# Patient Record
Sex: Female | Born: 1955
Health system: Southern US, Community
[De-identification: ages and names within clinical notes are randomized; demographics above are authoritative.]

## PROBLEM LIST (undated history)

## (undated) DIAGNOSIS — J449 Chronic obstructive pulmonary disease, unspecified: Secondary | ICD-10-CM

## (undated) DIAGNOSIS — F329 Major depressive disorder, single episode, unspecified: Secondary | ICD-10-CM

## (undated) DIAGNOSIS — M1990 Unspecified osteoarthritis, unspecified site: Secondary | ICD-10-CM

## (undated) DIAGNOSIS — N189 Chronic kidney disease, unspecified: Secondary | ICD-10-CM

## (undated) DIAGNOSIS — D649 Anemia, unspecified: Secondary | ICD-10-CM

## (undated) DIAGNOSIS — I499 Cardiac arrhythmia, unspecified: Secondary | ICD-10-CM

## (undated) DIAGNOSIS — E785 Hyperlipidemia, unspecified: Secondary | ICD-10-CM

## (undated) DIAGNOSIS — G4730 Sleep apnea, unspecified: Secondary | ICD-10-CM

## (undated) DIAGNOSIS — R079 Chest pain, unspecified: Secondary | ICD-10-CM

## (undated) DIAGNOSIS — F32A Depression, unspecified: Secondary | ICD-10-CM

## (undated) DIAGNOSIS — O2230 Deep phlebothrombosis in pregnancy, unspecified trimester: Secondary | ICD-10-CM

## (undated) DIAGNOSIS — J45909 Unspecified asthma, uncomplicated: Secondary | ICD-10-CM

## (undated) DIAGNOSIS — R51 Headache: Secondary | ICD-10-CM

## (undated) DIAGNOSIS — E119 Type 2 diabetes mellitus without complications: Secondary | ICD-10-CM

## (undated) DIAGNOSIS — E669 Obesity, unspecified: Secondary | ICD-10-CM

## (undated) DIAGNOSIS — I5032 Chronic diastolic (congestive) heart failure: Secondary | ICD-10-CM

## (undated) DIAGNOSIS — I10 Essential (primary) hypertension: Secondary | ICD-10-CM

## (undated) DIAGNOSIS — K219 Gastro-esophageal reflux disease without esophagitis: Secondary | ICD-10-CM

## (undated) DIAGNOSIS — R0602 Shortness of breath: Secondary | ICD-10-CM

## (undated) DIAGNOSIS — J189 Pneumonia, unspecified organism: Secondary | ICD-10-CM

## (undated) HISTORY — DX: Sleep apnea, unspecified: G47.30

## (undated) HISTORY — DX: Type 2 diabetes mellitus without complications: E11.9

## (undated) HISTORY — PX: CHOLECYSTECTOMY: SHX55

## (undated) HISTORY — DX: Obesity, unspecified: E66.9

## (undated) HISTORY — PX: CATARACT EXTRACTION, BILATERAL: SHX1313

## (undated) HISTORY — DX: Hyperlipidemia, unspecified: E78.5

## (undated) HISTORY — PX: OTHER SURGICAL HISTORY: SHX169

## (undated) HISTORY — DX: Essential (primary) hypertension: I10

## (undated) HISTORY — DX: Deep phlebothrombosis in pregnancy, unspecified trimester: O22.30

## (undated) HISTORY — PX: KNEE SURGERY: SHX244

## (undated) HISTORY — PX: ABDOMINAL HYSTERECTOMY: SHX81

## (undated) HISTORY — DX: Chronic diastolic (congestive) heart failure: I50.32

## (undated) HISTORY — PX: DIAGNOSTIC LAPAROSCOPY: SUR761

---

## 1998-01-04 ENCOUNTER — Emergency Department (HOSPITAL_COMMUNITY): Admission: EM | Admit: 1998-01-04 | Discharge: 1998-01-04 | Payer: Self-pay | Admitting: Emergency Medicine

## 1998-01-04 ENCOUNTER — Inpatient Hospital Stay (HOSPITAL_COMMUNITY): Admission: EM | Admit: 1998-01-04 | Discharge: 1998-01-04 | Payer: Self-pay | Admitting: Cardiology

## 1998-06-02 ENCOUNTER — Ambulatory Visit (HOSPITAL_COMMUNITY): Admission: RE | Admit: 1998-06-02 | Discharge: 1998-06-02 | Payer: Self-pay | Admitting: *Deleted

## 1998-11-18 ENCOUNTER — Other Ambulatory Visit: Admission: RE | Admit: 1998-11-18 | Discharge: 1998-11-18 | Payer: Self-pay | Admitting: Obstetrics and Gynecology

## 1998-11-22 ENCOUNTER — Ambulatory Visit (HOSPITAL_COMMUNITY): Admission: RE | Admit: 1998-11-22 | Discharge: 1998-11-22 | Payer: Self-pay | Admitting: Obstetrics and Gynecology

## 1998-11-22 ENCOUNTER — Encounter: Payer: Self-pay | Admitting: Obstetrics and Gynecology

## 1998-12-08 ENCOUNTER — Other Ambulatory Visit: Admission: RE | Admit: 1998-12-08 | Discharge: 1998-12-08 | Payer: Self-pay | Admitting: Obstetrics and Gynecology

## 1998-12-15 ENCOUNTER — Encounter: Payer: Self-pay | Admitting: Cardiology

## 1998-12-15 ENCOUNTER — Emergency Department (HOSPITAL_COMMUNITY): Admission: EM | Admit: 1998-12-15 | Discharge: 1998-12-15 | Payer: Self-pay | Admitting: Emergency Medicine

## 1999-04-08 ENCOUNTER — Encounter (INDEPENDENT_AMBULATORY_CARE_PROVIDER_SITE_OTHER): Payer: Self-pay | Admitting: Specialist

## 1999-04-09 ENCOUNTER — Inpatient Hospital Stay (HOSPITAL_COMMUNITY): Admission: RE | Admit: 1999-04-09 | Discharge: 1999-04-10 | Payer: Self-pay | Admitting: Obstetrics and Gynecology

## 1999-09-14 ENCOUNTER — Emergency Department (HOSPITAL_COMMUNITY): Admission: EM | Admit: 1999-09-14 | Discharge: 1999-09-14 | Payer: Self-pay | Admitting: Emergency Medicine

## 1999-09-14 ENCOUNTER — Encounter: Payer: Self-pay | Admitting: Emergency Medicine

## 1999-09-19 ENCOUNTER — Ambulatory Visit (HOSPITAL_COMMUNITY): Admission: RE | Admit: 1999-09-19 | Discharge: 1999-09-19 | Payer: Self-pay | Admitting: Cardiology

## 1999-09-20 ENCOUNTER — Encounter: Payer: Self-pay | Admitting: Cardiology

## 1999-09-29 ENCOUNTER — Ambulatory Visit (HOSPITAL_COMMUNITY): Admission: RE | Admit: 1999-09-29 | Discharge: 1999-09-29 | Payer: Self-pay | Admitting: Cardiology

## 1999-10-13 ENCOUNTER — Ambulatory Visit (HOSPITAL_COMMUNITY): Admission: RE | Admit: 1999-10-13 | Discharge: 1999-10-31 | Payer: Self-pay | Admitting: Cardiology

## 2000-10-18 ENCOUNTER — Encounter: Payer: Self-pay | Admitting: Emergency Medicine

## 2000-10-18 ENCOUNTER — Emergency Department (HOSPITAL_COMMUNITY): Admission: EM | Admit: 2000-10-18 | Discharge: 2000-10-18 | Payer: Self-pay | Admitting: Emergency Medicine

## 2000-11-14 ENCOUNTER — Encounter: Admission: RE | Admit: 2000-11-14 | Discharge: 2000-12-06 | Payer: Self-pay | Admitting: Orthopedic Surgery

## 2000-12-27 ENCOUNTER — Encounter: Admission: RE | Admit: 2000-12-27 | Discharge: 2001-03-27 | Payer: Self-pay | Admitting: Orthopedic Surgery

## 2001-01-16 ENCOUNTER — Encounter: Admission: RE | Admit: 2001-01-16 | Discharge: 2001-04-16 | Payer: Self-pay | Admitting: Anesthesiology

## 2001-02-19 ENCOUNTER — Encounter: Payer: Self-pay | Admitting: Cardiology

## 2001-02-19 ENCOUNTER — Ambulatory Visit (HOSPITAL_COMMUNITY): Admission: RE | Admit: 2001-02-19 | Discharge: 2001-02-19 | Payer: Self-pay | Admitting: Cardiology

## 2001-04-16 ENCOUNTER — Emergency Department (HOSPITAL_COMMUNITY): Admission: EM | Admit: 2001-04-16 | Discharge: 2001-04-16 | Payer: Self-pay | Admitting: Emergency Medicine

## 2001-04-30 ENCOUNTER — Encounter: Admission: RE | Admit: 2001-04-30 | Discharge: 2001-07-12 | Payer: Self-pay | Admitting: Orthopedic Surgery

## 2001-08-27 ENCOUNTER — Emergency Department (HOSPITAL_COMMUNITY): Admission: EM | Admit: 2001-08-27 | Discharge: 2001-08-27 | Payer: Self-pay | Admitting: Emergency Medicine

## 2002-07-28 ENCOUNTER — Other Ambulatory Visit: Admission: RE | Admit: 2002-07-28 | Discharge: 2002-07-28 | Payer: Self-pay | Admitting: Obstetrics and Gynecology

## 2002-08-05 ENCOUNTER — Encounter: Payer: Self-pay | Admitting: Obstetrics and Gynecology

## 2002-08-05 ENCOUNTER — Ambulatory Visit (HOSPITAL_COMMUNITY): Admission: RE | Admit: 2002-08-05 | Discharge: 2002-08-05 | Payer: Self-pay | Admitting: Obstetrics and Gynecology

## 2002-09-05 ENCOUNTER — Encounter (HOSPITAL_BASED_OUTPATIENT_CLINIC_OR_DEPARTMENT_OTHER): Payer: Self-pay | Admitting: General Surgery

## 2002-09-08 ENCOUNTER — Encounter (INDEPENDENT_AMBULATORY_CARE_PROVIDER_SITE_OTHER): Payer: Self-pay | Admitting: Specialist

## 2002-09-08 ENCOUNTER — Ambulatory Visit (HOSPITAL_COMMUNITY): Admission: RE | Admit: 2002-09-08 | Discharge: 2002-09-09 | Payer: Self-pay | Admitting: General Surgery

## 2004-01-26 ENCOUNTER — Ambulatory Visit (HOSPITAL_COMMUNITY): Admission: RE | Admit: 2004-01-26 | Discharge: 2004-01-26 | Payer: Self-pay | Admitting: Orthopedic Surgery

## 2004-02-26 ENCOUNTER — Encounter: Admission: RE | Admit: 2004-02-26 | Discharge: 2004-02-26 | Payer: Self-pay | Admitting: Cardiology

## 2004-03-07 ENCOUNTER — Encounter: Admission: RE | Admit: 2004-03-07 | Discharge: 2004-03-07 | Payer: Self-pay | Admitting: Orthopedic Surgery

## 2004-03-29 ENCOUNTER — Encounter: Admission: RE | Admit: 2004-03-29 | Discharge: 2004-06-27 | Payer: Self-pay | Admitting: Orthopedic Surgery

## 2004-06-19 ENCOUNTER — Emergency Department (HOSPITAL_COMMUNITY): Admission: EM | Admit: 2004-06-19 | Discharge: 2004-06-19 | Payer: Self-pay | Admitting: Emergency Medicine

## 2004-10-30 ENCOUNTER — Emergency Department (HOSPITAL_COMMUNITY): Admission: EM | Admit: 2004-10-30 | Discharge: 2004-10-30 | Payer: Self-pay | Admitting: Emergency Medicine

## 2004-12-12 ENCOUNTER — Other Ambulatory Visit: Admission: RE | Admit: 2004-12-12 | Discharge: 2004-12-12 | Payer: Self-pay | Admitting: Obstetrics and Gynecology

## 2004-12-21 ENCOUNTER — Encounter: Admission: RE | Admit: 2004-12-21 | Discharge: 2004-12-21 | Payer: Self-pay | Admitting: Orthopedic Surgery

## 2004-12-26 ENCOUNTER — Ambulatory Visit (HOSPITAL_COMMUNITY): Admission: RE | Admit: 2004-12-26 | Discharge: 2004-12-26 | Payer: Self-pay | Admitting: Obstetrics and Gynecology

## 2005-05-09 ENCOUNTER — Ambulatory Visit (HOSPITAL_BASED_OUTPATIENT_CLINIC_OR_DEPARTMENT_OTHER): Admission: RE | Admit: 2005-05-09 | Discharge: 2005-05-09 | Payer: Self-pay | Admitting: Cardiology

## 2005-05-14 ENCOUNTER — Ambulatory Visit: Payer: Self-pay | Admitting: Internal Medicine

## 2005-11-03 ENCOUNTER — Encounter: Admission: RE | Admit: 2005-11-03 | Discharge: 2005-11-03 | Payer: Self-pay | Admitting: Obstetrics and Gynecology

## 2005-11-06 ENCOUNTER — Encounter (HOSPITAL_COMMUNITY): Admission: RE | Admit: 2005-11-06 | Discharge: 2006-02-04 | Payer: Self-pay | Admitting: Cardiology

## 2005-11-30 ENCOUNTER — Encounter: Admission: RE | Admit: 2005-11-30 | Discharge: 2005-11-30 | Payer: Self-pay | Admitting: Cardiology

## 2006-02-08 ENCOUNTER — Ambulatory Visit (HOSPITAL_COMMUNITY): Admission: RE | Admit: 2006-02-08 | Discharge: 2006-02-08 | Payer: Self-pay | Admitting: Orthopedic Surgery

## 2007-01-19 ENCOUNTER — Emergency Department (HOSPITAL_COMMUNITY): Admission: EM | Admit: 2007-01-19 | Discharge: 2007-01-20 | Payer: Self-pay | Admitting: Emergency Medicine

## 2007-05-09 ENCOUNTER — Ambulatory Visit (HOSPITAL_BASED_OUTPATIENT_CLINIC_OR_DEPARTMENT_OTHER): Admission: RE | Admit: 2007-05-09 | Discharge: 2007-05-09 | Payer: Self-pay | Admitting: Cardiology

## 2007-05-19 ENCOUNTER — Ambulatory Visit: Payer: Self-pay | Admitting: Internal Medicine

## 2008-06-05 ENCOUNTER — Encounter (HOSPITAL_BASED_OUTPATIENT_CLINIC_OR_DEPARTMENT_OTHER): Admission: RE | Admit: 2008-06-05 | Discharge: 2008-08-11 | Payer: Self-pay | Admitting: Internal Medicine

## 2008-08-18 ENCOUNTER — Encounter (HOSPITAL_BASED_OUTPATIENT_CLINIC_OR_DEPARTMENT_OTHER): Admission: RE | Admit: 2008-08-18 | Discharge: 2008-11-16 | Payer: Self-pay | Admitting: Internal Medicine

## 2008-08-24 ENCOUNTER — Encounter: Admission: RE | Admit: 2008-08-24 | Discharge: 2008-08-24 | Payer: Self-pay | Admitting: Cardiology

## 2008-11-24 ENCOUNTER — Encounter (HOSPITAL_BASED_OUTPATIENT_CLINIC_OR_DEPARTMENT_OTHER): Admission: RE | Admit: 2008-11-24 | Discharge: 2009-02-22 | Payer: Self-pay | Admitting: General Surgery

## 2008-11-26 ENCOUNTER — Ambulatory Visit (HOSPITAL_COMMUNITY): Admission: RE | Admit: 2008-11-26 | Discharge: 2008-11-26 | Payer: Self-pay | Admitting: Cardiovascular Disease

## 2008-11-26 ENCOUNTER — Encounter: Payer: Self-pay | Admitting: Cardiovascular Disease

## 2009-01-20 ENCOUNTER — Encounter: Admission: RE | Admit: 2009-01-20 | Discharge: 2009-01-20 | Payer: Self-pay | Admitting: Cardiology

## 2009-03-02 ENCOUNTER — Encounter (HOSPITAL_BASED_OUTPATIENT_CLINIC_OR_DEPARTMENT_OTHER): Admission: RE | Admit: 2009-03-02 | Discharge: 2009-05-31 | Payer: Self-pay | Admitting: General Surgery

## 2009-07-09 ENCOUNTER — Emergency Department (HOSPITAL_COMMUNITY): Admission: EM | Admit: 2009-07-09 | Discharge: 2009-07-09 | Payer: Self-pay | Admitting: Emergency Medicine

## 2009-07-20 ENCOUNTER — Encounter (HOSPITAL_BASED_OUTPATIENT_CLINIC_OR_DEPARTMENT_OTHER): Admission: RE | Admit: 2009-07-20 | Discharge: 2009-10-18 | Payer: Self-pay | Admitting: Internal Medicine

## 2009-10-29 ENCOUNTER — Ambulatory Visit: Payer: Self-pay | Admitting: Internal Medicine

## 2009-10-29 ENCOUNTER — Inpatient Hospital Stay (HOSPITAL_COMMUNITY): Admission: EM | Admit: 2009-10-29 | Discharge: 2009-11-05 | Payer: Self-pay | Admitting: Emergency Medicine

## 2009-11-03 ENCOUNTER — Encounter (INDEPENDENT_AMBULATORY_CARE_PROVIDER_SITE_OTHER): Payer: Self-pay | Admitting: Cardiology

## 2009-11-24 ENCOUNTER — Inpatient Hospital Stay (HOSPITAL_COMMUNITY): Admission: EM | Admit: 2009-11-24 | Discharge: 2009-11-29 | Payer: Self-pay | Admitting: Emergency Medicine

## 2009-12-08 ENCOUNTER — Observation Stay (HOSPITAL_COMMUNITY): Admission: EM | Admit: 2009-12-08 | Discharge: 2009-12-12 | Payer: Self-pay | Admitting: Emergency Medicine

## 2009-12-10 ENCOUNTER — Ambulatory Visit: Payer: Self-pay | Admitting: Internal Medicine

## 2009-12-12 ENCOUNTER — Encounter (INDEPENDENT_AMBULATORY_CARE_PROVIDER_SITE_OTHER): Payer: Self-pay | Admitting: Cardiology

## 2009-12-12 ENCOUNTER — Encounter: Payer: Self-pay | Admitting: Internal Medicine

## 2009-12-30 ENCOUNTER — Inpatient Hospital Stay (HOSPITAL_COMMUNITY): Admission: EM | Admit: 2009-12-30 | Discharge: 2010-01-06 | Payer: Self-pay | Admitting: Emergency Medicine

## 2010-01-20 ENCOUNTER — Encounter (HOSPITAL_BASED_OUTPATIENT_CLINIC_OR_DEPARTMENT_OTHER): Admission: RE | Admit: 2010-01-20 | Discharge: 2010-04-14 | Payer: Self-pay | Admitting: Internal Medicine

## 2010-01-21 ENCOUNTER — Ambulatory Visit (HOSPITAL_COMMUNITY): Admission: RE | Admit: 2010-01-21 | Discharge: 2010-01-21 | Payer: Self-pay | Admitting: Gastroenterology

## 2010-02-04 DIAGNOSIS — E1169 Type 2 diabetes mellitus with other specified complication: Secondary | ICD-10-CM | POA: Insufficient documentation

## 2010-02-04 DIAGNOSIS — I4891 Unspecified atrial fibrillation: Secondary | ICD-10-CM | POA: Insufficient documentation

## 2010-02-04 DIAGNOSIS — K3184 Gastroparesis: Secondary | ICD-10-CM | POA: Insufficient documentation

## 2010-02-04 DIAGNOSIS — K5289 Other specified noninfective gastroenteritis and colitis: Secondary | ICD-10-CM

## 2010-02-04 DIAGNOSIS — K529 Noninfective gastroenteritis and colitis, unspecified: Secondary | ICD-10-CM | POA: Insufficient documentation

## 2010-02-04 DIAGNOSIS — I480 Paroxysmal atrial fibrillation: Secondary | ICD-10-CM | POA: Insufficient documentation

## 2010-02-04 DIAGNOSIS — E119 Type 2 diabetes mellitus without complications: Secondary | ICD-10-CM | POA: Insufficient documentation

## 2010-02-10 ENCOUNTER — Encounter (INDEPENDENT_AMBULATORY_CARE_PROVIDER_SITE_OTHER): Payer: Self-pay | Admitting: *Deleted

## 2010-02-25 ENCOUNTER — Emergency Department (HOSPITAL_COMMUNITY): Admission: EM | Admit: 2010-02-25 | Discharge: 2010-02-25 | Payer: Self-pay | Admitting: Emergency Medicine

## 2010-05-19 ENCOUNTER — Encounter: Payer: Self-pay | Admitting: Internal Medicine

## 2010-09-10 ENCOUNTER — Encounter: Payer: Self-pay | Admitting: Orthopedic Surgery

## 2010-09-11 ENCOUNTER — Encounter: Payer: Self-pay | Admitting: Cardiology

## 2010-09-11 ENCOUNTER — Encounter: Payer: Self-pay | Admitting: Orthopedic Surgery

## 2010-09-20 NOTE — Letter (Signed)
Summary: Appointment - Missed  [facility], [place]  [address] [place]   [place], [place] [address]   Phone: [phone]  Fax: [phone]     February 10, 2010 MRN: [id number]   [name] [address] [place], [place]  [address]   Dear [name],  Our records indicate you missed your appointment on 02/07/10 with Dr Rayann Heman. It is very important that we reach you to reschedule this appointment. We look forward to participating in your health care needs. Please contact us at the number listed above at your earliest convenience to reschedule this appointment.     Sincerely,   Luanna Cole Scheduling Team

## 2010-09-20 NOTE — Procedures (Signed)
Summary: Upper Endoscopy  Patient: [name] Note: All result statuses are Final unless otherwise noted.  Tests: (1) Upper Endoscopy (EGD)   EGD Upper Endoscopy       DONE     [facility]     [place], [place]  [address]           ENDOSCOPY PROCEDURE REPORT           PATIENT:  Leslie, Gallagher  MR#:  [id number]     BIRTHDATE:  Dec 22, 1955, 24 yrs. old  GENDER:  female           ENDOSCOPIST:  Lowella Bandy. Olevia Perches, MD     Referred by:  Wallene Huh, M.D.           PROCEDURE DATE:  12/12/2009     PROCEDURE:  EGD with biopsy     ASA CLASS:  Class III     INDICATIONS:  abdominal pain gastroparesis on GES 2 days ago, only     12% empties in 2 hours           MEDICATIONS:   Versed 5 mg, Fentanyl 50 mcg     TOPICAL ANESTHETIC:  Cetacaine Spray           DESCRIPTION OF PROCEDURE:   After the risks benefits and     alternatives of the procedure were thoroughly explained, informed     consent was obtained.  The  endoscope was introduced through the     mouth and advanced to the second portion of the duodenum, without     limitations.  The instrument was slowly withdrawn as the mucosa     was fully examined.     <<PROCEDUREIMAGES>>           The upper, middle, and distal third of the esophagus were     carefully inspected and no abnormalities were noted. The z-line     was well seen at the GEJ. The endoscope was pushed into the fundus     which was normal including a retroflexed view. The antrum,gastric     body, first and second part of the duodenum were unremarkable.     small amount of bilious material in the stomach, no food retention     With standard forceps, a biopsy was obtained and sent to pathology     (see image2, image3, image1, image4, image5, image6, image7, and     image8).    Retroflexed views revealed no abnormalities.    The     scope was then withdrawn from the patient and the procedure     completed.        COMPLICATIONS:  None           ENDOSCOPIC IMPRESSION:     1) Normal EGD     no hiatal hernia or food retained, s/p antral biopsy to r/o     H.pylori     RECOMMENDATIONS:     1) Await biopsy results     PPI bid,     Reglan 10 mg tid,ac     trial of Carafate 1gm bid for bile reflux           REPEAT EXAM:  In 0 year(s) for.           ______________________________     Lowella Bandy. Olevia Perches, MD           CC:  n.     eSIGNED:   Lowella Bandy. Tavarius Grewe at 12/12/2009 09:49 AM           [name], [id number]  Note: An exclamation mark (!) indicates a result that was not dispersed into the flowsheet. Document Creation Date: 12/12/2009 9:50 AM _______________________________________________________________________  (1) Order result status: Final Collection or observation date-time: 12/12/2009 09:43 Requested date-time:  Receipt date-time:  Reported date-time:  Referring Physician:   Ordering Physician: Delfin Edis [phone]) Specimen Source:  Source: Tawanna Cooler Order Number: [id number] Lab site:

## 2010-11-06 LAB — COMPREHENSIVE METABOLIC PANEL
ALT: 28 U/L (ref 0–35)
AST: 29 U/L (ref 0–37)
Albumin: 3.5 g/dL (ref 3.5–5.2)
Alkaline Phosphatase: 76 U/L (ref 39–117)
BUN: 9 mg/dL (ref 6–23)
CO2: 24 mEq/L (ref 19–32)
Calcium: 9.1 mg/dL (ref 8.4–10.5)
Chloride: 95 mEq/L — ABNORMAL LOW (ref 96–112)
Creatinine, Ser: 1.55 mg/dL — ABNORMAL HIGH (ref 0.4–1.2)
GFR calc Af Amer: 42 mL/min — ABNORMAL LOW (ref >60)
GFR calc non Af Amer: 35 mL/min — ABNORMAL LOW (ref >60)
Glucose, Bld: 581 mg/dL (ref 70–99)
Potassium: 4.5 mEq/L (ref 3.5–5.1)
Sodium: 129 mEq/L — ABNORMAL LOW (ref 135–145)
Total Bilirubin: 0.7 mg/dL (ref 0.3–1.2)
Total Protein: 8.5 g/dL — ABNORMAL HIGH (ref 6.0–8.3)

## 2010-11-06 LAB — CBC
HCT: 45.4 % (ref 36.0–46.0)
Hemoglobin: 15.3 g/dL — ABNORMAL HIGH (ref 12.0–15.0)
MCH: 30.4 pg (ref 26.0–34.0)
MCHC: 33.7 g/dL (ref 30.0–36.0)
MCV: 90.4 fL (ref 78.0–100.0)
Platelets: 215 10*3/uL (ref 150–400)
RBC: 5.02 MIL/uL (ref 3.87–5.11)
RDW: 14.4 % (ref 11.5–15.5)
WBC: 8.9 10*3/uL (ref 4.0–10.5)

## 2010-11-06 LAB — URINALYSIS, ROUTINE W REFLEX MICROSCOPIC
Bilirubin Urine: NEGATIVE
Glucose, UA: >1000 mg/dL — AB
Hgb urine dipstick: NEGATIVE
Ketones, ur: NEGATIVE mg/dL
Leukocytes, UA: NEGATIVE
Nitrite: NEGATIVE
Protein, ur: NEGATIVE mg/dL
Specific Gravity, Urine: 1.037 — ABNORMAL HIGH (ref 1.005–1.030)
Urobilinogen, UA: 1 mg/dL (ref 0.0–1.0)
pH: 5.5 (ref 5.0–8.0)

## 2010-11-06 LAB — POCT CARDIAC MARKERS
CKMB, poc: <1 ng/mL — ABNORMAL LOW (ref 1.0–8.0)
Myoglobin, poc: 257 ng/mL (ref 12–200)
Troponin i, poc: <0.05 ng/mL (ref 0.00–0.09)

## 2010-11-06 LAB — DIFFERENTIAL
Basophils Absolute: 0 10*3/uL (ref 0.0–0.1)
Basophils Relative: 0 % (ref 0–1)
Eosinophils Absolute: 0.1 10*3/uL (ref 0.0–0.7)
Eosinophils Relative: 2 % (ref 0–5)
Lymphocytes Relative: 23 % (ref 12–46)
Lymphs Abs: 2 10*3/uL (ref 0.7–4.0)
Monocytes Absolute: 0.8 10*3/uL (ref 0.1–1.0)
Monocytes Relative: 9 % (ref 3–12)
Neutro Abs: 5.9 10*3/uL (ref 1.7–7.7)
Neutrophils Relative %: 66 % (ref 43–77)

## 2010-11-06 LAB — GLUCOSE, CAPILLARY
Glucose-Capillary: 433 mg/dL — ABNORMAL HIGH (ref 70–99)
Glucose-Capillary: 439 mg/dL — ABNORMAL HIGH (ref 70–99)
Glucose-Capillary: 445 mg/dL — ABNORMAL HIGH (ref 70–99)
Glucose-Capillary: 553 mg/dL (ref 70–99)

## 2010-11-06 LAB — URINE MICROSCOPIC-ADD ON

## 2010-11-06 LAB — BRAIN NATRIURETIC PEPTIDE: Pro B Natriuretic peptide (BNP): 103 pg/mL — ABNORMAL HIGH (ref 0.0–100.0)

## 2010-11-07 LAB — GLUCOSE, CAPILLARY
Glucose-Capillary: 183 mg/dL — ABNORMAL HIGH (ref 70–99)
Glucose-Capillary: 145 mg/dL — ABNORMAL HIGH (ref 70–99)
Glucose-Capillary: 146 mg/dL — ABNORMAL HIGH (ref 70–99)
Glucose-Capillary: 164 mg/dL — ABNORMAL HIGH (ref 70–99)
Glucose-Capillary: 166 mg/dL — ABNORMAL HIGH (ref 70–99)
Glucose-Capillary: 169 mg/dL — ABNORMAL HIGH (ref 70–99)
Glucose-Capillary: 179 mg/dL — ABNORMAL HIGH (ref 70–99)
Glucose-Capillary: 183 mg/dL — ABNORMAL HIGH (ref 70–99)
Glucose-Capillary: 191 mg/dL — ABNORMAL HIGH (ref 70–99)
Glucose-Capillary: 196 mg/dL — ABNORMAL HIGH (ref 70–99)
Glucose-Capillary: 199 mg/dL — ABNORMAL HIGH (ref 70–99)
Glucose-Capillary: 203 mg/dL — ABNORMAL HIGH (ref 70–99)
Glucose-Capillary: 205 mg/dL — ABNORMAL HIGH (ref 70–99)
Glucose-Capillary: 208 mg/dL — ABNORMAL HIGH (ref 70–99)
Glucose-Capillary: 219 mg/dL — ABNORMAL HIGH (ref 70–99)
Glucose-Capillary: 226 mg/dL — ABNORMAL HIGH (ref 70–99)
Glucose-Capillary: 234 mg/dL — ABNORMAL HIGH (ref 70–99)
Glucose-Capillary: 242 mg/dL — ABNORMAL HIGH (ref 70–99)
Glucose-Capillary: 263 mg/dL — ABNORMAL HIGH (ref 70–99)

## 2010-11-07 LAB — PROTIME-INR
INR: 1.85 — ABNORMAL HIGH (ref 0.00–1.49)
INR: 1.86 — ABNORMAL HIGH (ref 0.00–1.49)
INR: 2.28 — ABNORMAL HIGH (ref 0.00–1.49)
INR: 2.83 — ABNORMAL HIGH (ref 0.00–1.49)
INR: 3.63 — ABNORMAL HIGH (ref 0.00–1.49)
Prothrombin Time: 21.2 seconds — ABNORMAL HIGH (ref 11.6–15.2)
Prothrombin Time: 21.3 seconds — ABNORMAL HIGH (ref 11.6–15.2)
Prothrombin Time: 24.9 seconds — ABNORMAL HIGH (ref 11.6–15.2)
Prothrombin Time: 29.5 seconds — ABNORMAL HIGH (ref 11.6–15.2)
Prothrombin Time: 35.9 seconds — ABNORMAL HIGH (ref 11.6–15.2)

## 2010-11-07 LAB — COMPREHENSIVE METABOLIC PANEL
ALT: 89 U/L — ABNORMAL HIGH (ref 0–35)
AST: 101 U/L — ABNORMAL HIGH (ref 0–37)
Albumin: 2.9 g/dL — ABNORMAL LOW (ref 3.5–5.2)
Alkaline Phosphatase: 40 U/L (ref 39–117)
BUN: 5 mg/dL — ABNORMAL LOW (ref 6–23)
CO2: 31 mEq/L (ref 19–32)
Calcium: 9.1 mg/dL (ref 8.4–10.5)
Chloride: 100 mEq/L (ref 96–112)
Creatinine, Ser: 1.38 mg/dL — ABNORMAL HIGH (ref 0.4–1.2)
GFR calc Af Amer: 48 mL/min — ABNORMAL LOW (ref >60)
GFR calc non Af Amer: 40 mL/min — ABNORMAL LOW (ref >60)
Glucose, Bld: 163 mg/dL — ABNORMAL HIGH (ref 70–99)
Potassium: 4.3 mEq/L (ref 3.5–5.1)
Sodium: 138 mEq/L (ref 135–145)
Total Bilirubin: 1.3 mg/dL — ABNORMAL HIGH (ref 0.3–1.2)
Total Protein: 6.7 g/dL (ref 6.0–8.3)

## 2010-11-07 LAB — DIFFERENTIAL
Basophils Absolute: 0.1 10*3/uL (ref 0.0–0.1)
Basophils Relative: 1 % (ref 0–1)
Eosinophils Absolute: 0.2 10*3/uL (ref 0.0–0.7)
Eosinophils Relative: 3 % (ref 0–5)
Lymphocytes Relative: 30 % (ref 12–46)
Lymphs Abs: 1.8 10*3/uL (ref 0.7–4.0)
Monocytes Absolute: 0.9 10*3/uL (ref 0.1–1.0)
Monocytes Relative: 16 % — ABNORMAL HIGH (ref 3–12)
Neutro Abs: 2.9 10*3/uL (ref 1.7–7.7)
Neutrophils Relative %: 50 % (ref 43–77)

## 2010-11-07 LAB — BASIC METABOLIC PANEL
BUN: 5 mg/dL — ABNORMAL LOW (ref 6–23)
BUN: 5 mg/dL — ABNORMAL LOW (ref 6–23)
CO2: 29 mEq/L (ref 19–32)
CO2: 30 mEq/L (ref 19–32)
Calcium: 9.1 mg/dL (ref 8.4–10.5)
Calcium: 9.3 mg/dL (ref 8.4–10.5)
Chloride: 102 mEq/L (ref 96–112)
Chloride: 103 mEq/L (ref 96–112)
Creatinine, Ser: 1.28 mg/dL — ABNORMAL HIGH (ref 0.4–1.2)
Creatinine, Ser: 1.39 mg/dL — ABNORMAL HIGH (ref 0.4–1.2)
GFR calc Af Amer: 48 mL/min — ABNORMAL LOW (ref >60)
GFR calc Af Amer: 53 mL/min — ABNORMAL LOW (ref >60)
GFR calc non Af Amer: 40 mL/min — ABNORMAL LOW (ref >60)
GFR calc non Af Amer: 44 mL/min — ABNORMAL LOW (ref >60)
Glucose, Bld: 158 mg/dL — ABNORMAL HIGH (ref 70–99)
Glucose, Bld: 209 mg/dL — ABNORMAL HIGH (ref 70–99)
Potassium: 3.8 mEq/L (ref 3.5–5.1)
Potassium: 3.8 mEq/L (ref 3.5–5.1)
Sodium: 138 mEq/L (ref 135–145)
Sodium: 139 mEq/L (ref 135–145)

## 2010-11-07 LAB — CBC
HCT: 39.2 % (ref 36.0–46.0)
HCT: 39.3 % (ref 36.0–46.0)
Hemoglobin: 13 g/dL (ref 12.0–15.0)
Hemoglobin: 13.1 g/dL (ref 12.0–15.0)
MCHC: 33.1 g/dL (ref 30.0–36.0)
MCHC: 33.3 g/dL (ref 30.0–36.0)
MCV: 92.7 fL (ref 78.0–100.0)
MCV: 93 fL (ref 78.0–100.0)
Platelets: 173 10*3/uL (ref 150–400)
Platelets: 176 10*3/uL (ref 150–400)
RBC: 4.22 MIL/uL (ref 3.87–5.11)
RBC: 4.23 MIL/uL (ref 3.87–5.11)
RDW: 15.5 % (ref 11.5–15.5)
RDW: 15.5 % (ref 11.5–15.5)
WBC: 5.9 10*3/uL (ref 4.0–10.5)
WBC: 6.2 10*3/uL (ref 4.0–10.5)

## 2010-11-07 LAB — DIGOXIN LEVEL: Digoxin Level: 1.2 ng/mL (ref 0.8–2.0)

## 2010-11-08 LAB — PROTIME-INR
INR: 5.75 (ref 0.00–1.49)
INR: 6.04 (ref 0.00–1.49)
INR: 6.58 (ref 0.00–1.49)
INR: 2.1 — ABNORMAL HIGH (ref 0.00–1.49)
INR: 2.36 — ABNORMAL HIGH (ref 0.00–1.49)
INR: 2.36 — ABNORMAL HIGH (ref 0.00–1.49)
INR: 2.62 — ABNORMAL HIGH (ref 0.00–1.49)
INR: 2.8 — ABNORMAL HIGH (ref 0.00–1.49)
Prothrombin Time: 51.4 seconds — ABNORMAL HIGH (ref 11.6–15.2)
Prothrombin Time: 53.4 seconds — ABNORMAL HIGH (ref 11.6–15.2)
Prothrombin Time: 57.1 seconds — ABNORMAL HIGH (ref 11.6–15.2)
Prothrombin Time: 23.4 seconds — ABNORMAL HIGH (ref 11.6–15.2)
Prothrombin Time: 25.6 seconds — ABNORMAL HIGH (ref 11.6–15.2)
Prothrombin Time: 25.6 seconds — ABNORMAL HIGH (ref 11.6–15.2)
Prothrombin Time: 27.8 seconds — ABNORMAL HIGH (ref 11.6–15.2)
Prothrombin Time: 29.3 seconds — ABNORMAL HIGH (ref 11.6–15.2)

## 2010-11-08 LAB — COMPREHENSIVE METABOLIC PANEL
ALT: 65 U/L — ABNORMAL HIGH (ref 0–35)
ALT: 106 U/L — ABNORMAL HIGH (ref 0–35)
AST: 76 U/L — ABNORMAL HIGH (ref 0–37)
AST: 100 U/L — ABNORMAL HIGH (ref 0–37)
Albumin: 3.5 g/dL (ref 3.5–5.2)
Albumin: 3.7 g/dL (ref 3.5–5.2)
Alkaline Phosphatase: 45 U/L (ref 39–117)
Alkaline Phosphatase: 46 U/L (ref 39–117)
BUN: 8 mg/dL (ref 6–23)
BUN: 5 mg/dL — ABNORMAL LOW (ref 6–23)
CO2: 27 mEq/L (ref 19–32)
CO2: 28 mEq/L (ref 19–32)
Calcium: 9.1 mg/dL (ref 8.4–10.5)
Calcium: 9.4 mg/dL (ref 8.4–10.5)
Chloride: 99 mEq/L (ref 96–112)
Chloride: 98 mEq/L (ref 96–112)
Creatinine, Ser: 1.76 mg/dL — ABNORMAL HIGH (ref 0.4–1.2)
Creatinine, Ser: 1.68 mg/dL — ABNORMAL HIGH (ref 0.4–1.2)
GFR calc Af Amer: 37 mL/min — ABNORMAL LOW (ref >60)
GFR calc Af Amer: 39 mL/min — ABNORMAL LOW (ref >60)
GFR calc non Af Amer: 30 mL/min — ABNORMAL LOW (ref >60)
GFR calc non Af Amer: 32 mL/min — ABNORMAL LOW (ref >60)
Glucose, Bld: 154 mg/dL — ABNORMAL HIGH (ref 70–99)
Glucose, Bld: 179 mg/dL — ABNORMAL HIGH (ref 70–99)
Potassium: 4 mEq/L (ref 3.5–5.1)
Potassium: 3.8 mEq/L (ref 3.5–5.1)
Sodium: 135 mEq/L (ref 135–145)
Sodium: 138 mEq/L (ref 135–145)
Total Bilirubin: 0.9 mg/dL (ref 0.3–1.2)
Total Bilirubin: 0.9 mg/dL (ref 0.3–1.2)
Total Protein: 7.6 g/dL (ref 6.0–8.3)
Total Protein: 8 g/dL (ref 6.0–8.3)

## 2010-11-08 LAB — URINE MICROSCOPIC-ADD ON

## 2010-11-08 LAB — CBC
HCT: 39.6 % (ref 36.0–46.0)
HCT: 43.3 % (ref 36.0–46.0)
HCT: 36.2 % (ref 36.0–46.0)
HCT: 40.6 % (ref 36.0–46.0)
Hemoglobin: 13.1 g/dL (ref 12.0–15.0)
Hemoglobin: 14.4 g/dL (ref 12.0–15.0)
Hemoglobin: 11.9 g/dL — ABNORMAL LOW (ref 12.0–15.0)
Hemoglobin: 13.2 g/dL (ref 12.0–15.0)
MCHC: 33.2 g/dL (ref 30.0–36.0)
MCHC: 33.2 g/dL (ref 30.0–36.0)
MCHC: 32.4 g/dL (ref 30.0–36.0)
MCHC: 33 g/dL (ref 30.0–36.0)
MCV: 91.8 fL (ref 78.0–100.0)
MCV: 92.2 fL (ref 78.0–100.0)
MCV: 91.8 fL (ref 78.0–100.0)
MCV: 92.6 fL (ref 78.0–100.0)
Platelets: 172 10*3/uL (ref 150–400)
Platelets: 195 10*3/uL (ref 150–400)
Platelets: 176 10*3/uL (ref 150–400)
Platelets: 197 10*3/uL (ref 150–400)
RBC: 4.31 MIL/uL (ref 3.87–5.11)
RBC: 4.69 MIL/uL (ref 3.87–5.11)
RBC: 3.94 MIL/uL (ref 3.87–5.11)
RBC: 4.39 MIL/uL (ref 3.87–5.11)
RDW: 15.4 % (ref 11.5–15.5)
RDW: 15.6 % — ABNORMAL HIGH (ref 11.5–15.5)
RDW: 15.9 % — ABNORMAL HIGH (ref 11.5–15.5)
RDW: 16.1 % — ABNORMAL HIGH (ref 11.5–15.5)
WBC: 7.2 10*3/uL (ref 4.0–10.5)
WBC: 7.2 10*3/uL (ref 4.0–10.5)
WBC: 6.4 10*3/uL (ref 4.0–10.5)
WBC: 6.7 10*3/uL (ref 4.0–10.5)

## 2010-11-08 LAB — URINALYSIS, ROUTINE W REFLEX MICROSCOPIC
Glucose, UA: NEGATIVE mg/dL
Glucose, UA: NEGATIVE mg/dL
Hgb urine dipstick: NEGATIVE
Hgb urine dipstick: NEGATIVE
Ketones, ur: 15 mg/dL — AB
Leukocytes, UA: NEGATIVE
Leukocytes, UA: NEGATIVE
Nitrite: NEGATIVE
Nitrite: NEGATIVE
Protein, ur: 30 mg/dL — AB
Protein, ur: 30 mg/dL — AB
Specific Gravity, Urine: 1.023 (ref 1.005–1.030)
Specific Gravity, Urine: >1.046 — ABNORMAL HIGH (ref 1.005–1.030)
Urobilinogen, UA: 1 mg/dL (ref 0.0–1.0)
Urobilinogen, UA: 1 mg/dL (ref 0.0–1.0)
pH: 5.5 (ref 5.0–8.0)
pH: 5 (ref 5.0–8.0)

## 2010-11-08 LAB — GLUCOSE, CAPILLARY
Glucose-Capillary: 121 mg/dL — ABNORMAL HIGH (ref 70–99)
Glucose-Capillary: 147 mg/dL — ABNORMAL HIGH (ref 70–99)
Glucose-Capillary: 147 mg/dL — ABNORMAL HIGH (ref 70–99)
Glucose-Capillary: 162 mg/dL — ABNORMAL HIGH (ref 70–99)
Glucose-Capillary: 163 mg/dL — ABNORMAL HIGH (ref 70–99)
Glucose-Capillary: 176 mg/dL — ABNORMAL HIGH (ref 70–99)
Glucose-Capillary: 185 mg/dL — ABNORMAL HIGH (ref 70–99)
Glucose-Capillary: 185 mg/dL — ABNORMAL HIGH (ref 70–99)
Glucose-Capillary: 199 mg/dL — ABNORMAL HIGH (ref 70–99)
Glucose-Capillary: 204 mg/dL — ABNORMAL HIGH (ref 70–99)
Glucose-Capillary: 132 mg/dL — ABNORMAL HIGH (ref 70–99)
Glucose-Capillary: 133 mg/dL — ABNORMAL HIGH (ref 70–99)
Glucose-Capillary: 138 mg/dL — ABNORMAL HIGH (ref 70–99)
Glucose-Capillary: 142 mg/dL — ABNORMAL HIGH (ref 70–99)
Glucose-Capillary: 151 mg/dL — ABNORMAL HIGH (ref 70–99)
Glucose-Capillary: 168 mg/dL — ABNORMAL HIGH (ref 70–99)
Glucose-Capillary: 170 mg/dL — ABNORMAL HIGH (ref 70–99)
Glucose-Capillary: 174 mg/dL — ABNORMAL HIGH (ref 70–99)
Glucose-Capillary: 177 mg/dL — ABNORMAL HIGH (ref 70–99)
Glucose-Capillary: 183 mg/dL — ABNORMAL HIGH (ref 70–99)
Glucose-Capillary: 188 mg/dL — ABNORMAL HIGH (ref 70–99)
Glucose-Capillary: 191 mg/dL — ABNORMAL HIGH (ref 70–99)
Glucose-Capillary: 193 mg/dL — ABNORMAL HIGH (ref 70–99)
Glucose-Capillary: 200 mg/dL — ABNORMAL HIGH (ref 70–99)
Glucose-Capillary: 201 mg/dL — ABNORMAL HIGH (ref 70–99)
Glucose-Capillary: 202 mg/dL — ABNORMAL HIGH (ref 70–99)
Glucose-Capillary: 216 mg/dL — ABNORMAL HIGH (ref 70–99)
Glucose-Capillary: 255 mg/dL — ABNORMAL HIGH (ref 70–99)

## 2010-11-08 LAB — URINE CULTURE: Colony Count: 45000

## 2010-11-08 LAB — BASIC METABOLIC PANEL
BUN: 5 mg/dL — ABNORMAL LOW (ref 6–23)
CO2: 28 mEq/L (ref 19–32)
Calcium: 9.1 mg/dL (ref 8.4–10.5)
Chloride: 104 mEq/L (ref 96–112)
Creatinine, Ser: 1.67 mg/dL — ABNORMAL HIGH (ref 0.4–1.2)
GFR calc Af Amer: 39 mL/min — ABNORMAL LOW (ref >60)
GFR calc non Af Amer: 32 mL/min — ABNORMAL LOW (ref >60)
Glucose, Bld: 140 mg/dL — ABNORMAL HIGH (ref 70–99)
Potassium: 3.7 mEq/L (ref 3.5–5.1)
Sodium: 139 mEq/L (ref 135–145)

## 2010-11-08 LAB — DIFFERENTIAL
Basophils Absolute: 0 10*3/uL (ref 0.0–0.1)
Basophils Absolute: 0 10*3/uL (ref 0.0–0.1)
Basophils Relative: 1 % (ref 0–1)
Basophils Relative: 1 % (ref 0–1)
Eosinophils Absolute: 0.1 10*3/uL (ref 0.0–0.7)
Eosinophils Absolute: 0.1 10*3/uL (ref 0.0–0.7)
Eosinophils Relative: 1 % (ref 0–5)
Eosinophils Relative: 2 % (ref 0–5)
Lymphocytes Relative: 30 % (ref 12–46)
Lymphocytes Relative: 18 % (ref 12–46)
Lymphs Abs: 2.2 10*3/uL (ref 0.7–4.0)
Lymphs Abs: 1.2 10*3/uL (ref 0.7–4.0)
Monocytes Absolute: 1.3 10*3/uL — ABNORMAL HIGH (ref 0.1–1.0)
Monocytes Absolute: 1.2 10*3/uL — ABNORMAL HIGH (ref 0.1–1.0)
Monocytes Relative: 18 % — ABNORMAL HIGH (ref 3–12)
Monocytes Relative: 18 % — ABNORMAL HIGH (ref 3–12)
Neutro Abs: 3.6 10*3/uL (ref 1.7–7.7)
Neutro Abs: 4.2 10*3/uL (ref 1.7–7.7)
Neutrophils Relative %: 50 % (ref 43–77)
Neutrophils Relative %: 62 % (ref 43–77)

## 2010-11-08 LAB — POCT CARDIAC MARKERS
CKMB, poc: 1.2 ng/mL (ref 1.0–8.0)
Myoglobin, poc: 364 ng/mL (ref 12–200)
Troponin i, poc: <0.05 ng/mL (ref 0.00–0.09)

## 2010-11-08 LAB — HEPATIC FUNCTION PANEL
ALT: 92 U/L — ABNORMAL HIGH (ref 0–35)
ALT: 93 U/L — ABNORMAL HIGH (ref 0–35)
AST: 80 U/L — ABNORMAL HIGH (ref 0–37)
AST: 88 U/L — ABNORMAL HIGH (ref 0–37)
Albumin: 3.1 g/dL — ABNORMAL LOW (ref 3.5–5.2)
Albumin: 3.2 g/dL — ABNORMAL LOW (ref 3.5–5.2)
Alkaline Phosphatase: 40 U/L (ref 39–117)
Alkaline Phosphatase: 42 U/L (ref 39–117)
Bilirubin, Direct: 0.2 mg/dL (ref 0.0–0.3)
Bilirubin, Direct: 0.2 mg/dL (ref 0.0–0.3)
Indirect Bilirubin: 0.4 mg/dL (ref 0.3–0.9)
Indirect Bilirubin: 0.8 mg/dL (ref 0.3–0.9)
Total Bilirubin: 0.6 mg/dL (ref 0.3–1.2)
Total Bilirubin: 1 mg/dL (ref 0.3–1.2)
Total Protein: 6.8 g/dL (ref 6.0–8.3)
Total Protein: 7 g/dL (ref 6.0–8.3)

## 2010-11-08 LAB — HEPATITIS PANEL, ACUTE
HCV Ab: NEGATIVE
Hep A IgM: NEGATIVE
Hep B C IgM: NEGATIVE
Hepatitis B Surface Ag: NEGATIVE

## 2010-11-08 LAB — BRAIN NATRIURETIC PEPTIDE
Pro B Natriuretic peptide (BNP): <30 pg/mL (ref 0.0–100.0)
Pro B Natriuretic peptide (BNP): 97.9 pg/mL (ref 0.0–100.0)
Pro B Natriuretic peptide (BNP): <30 pg/mL (ref 0.0–100.0)

## 2010-11-08 LAB — LIPASE, BLOOD
Lipase: 21 U/L (ref 11–59)
Lipase: 17 U/L (ref 11–59)
Lipase: 22 U/L (ref 11–59)

## 2010-11-08 LAB — DIGOXIN LEVEL
Digoxin Level: 1.8 ng/mL (ref 0.8–2.0)
Digoxin Level: 1.4 ng/mL (ref 0.8–2.0)

## 2010-11-08 LAB — APTT: aPTT: 40 seconds — ABNORMAL HIGH (ref 24–37)

## 2010-11-08 LAB — AMYLASE: Amylase: 36 U/L (ref 0–105)

## 2010-11-08 LAB — HEMOCCULT GUIAC POC 1CARD (OFFICE): Fecal Occult Bld: NEGATIVE

## 2010-11-09 LAB — CARDIAC PANEL(CRET KIN+CKTOT+MB+TROPI)
CK, MB: 1.2 ng/mL (ref 0.3–4.0)
CK, MB: 1.2 ng/mL (ref 0.3–4.0)
CK, MB: 1.3 ng/mL (ref 0.3–4.0)
Relative Index: 0.4 (ref 0.0–2.5)
Relative Index: 0.5 (ref 0.0–2.5)
Relative Index: 0.6 (ref 0.0–2.5)
Total CK: 196 U/L — ABNORMAL HIGH (ref 7–177)
Total CK: 232 U/L — ABNORMAL HIGH (ref 7–177)
Total CK: 291 U/L — ABNORMAL HIGH (ref 7–177)
Troponin I: 0.01 ng/mL (ref 0.00–0.06)
Troponin I: <0.01 ng/mL (ref 0.00–0.06)
Troponin I: <0.01 ng/mL (ref 0.00–0.06)

## 2010-11-09 LAB — GLUCOSE, CAPILLARY
Glucose-Capillary: 119 mg/dL — ABNORMAL HIGH (ref 70–99)
Glucose-Capillary: 124 mg/dL — ABNORMAL HIGH (ref 70–99)
Glucose-Capillary: 136 mg/dL — ABNORMAL HIGH (ref 70–99)
Glucose-Capillary: 146 mg/dL — ABNORMAL HIGH (ref 70–99)
Glucose-Capillary: 148 mg/dL — ABNORMAL HIGH (ref 70–99)
Glucose-Capillary: 149 mg/dL — ABNORMAL HIGH (ref 70–99)
Glucose-Capillary: 151 mg/dL — ABNORMAL HIGH (ref 70–99)
Glucose-Capillary: 155 mg/dL — ABNORMAL HIGH (ref 70–99)
Glucose-Capillary: 155 mg/dL — ABNORMAL HIGH (ref 70–99)
Glucose-Capillary: 173 mg/dL — ABNORMAL HIGH (ref 70–99)
Glucose-Capillary: 177 mg/dL — ABNORMAL HIGH (ref 70–99)
Glucose-Capillary: 185 mg/dL — ABNORMAL HIGH (ref 70–99)
Glucose-Capillary: 186 mg/dL — ABNORMAL HIGH (ref 70–99)
Glucose-Capillary: 190 mg/dL — ABNORMAL HIGH (ref 70–99)
Glucose-Capillary: 193 mg/dL — ABNORMAL HIGH (ref 70–99)
Glucose-Capillary: 200 mg/dL — ABNORMAL HIGH (ref 70–99)
Glucose-Capillary: 206 mg/dL — ABNORMAL HIGH (ref 70–99)
Glucose-Capillary: 220 mg/dL — ABNORMAL HIGH (ref 70–99)
Glucose-Capillary: 231 mg/dL — ABNORMAL HIGH (ref 70–99)

## 2010-11-09 LAB — CBC
HCT: 38.2 % (ref 36.0–46.0)
HCT: 38.7 % (ref 36.0–46.0)
HCT: 39.2 % (ref 36.0–46.0)
HCT: 41.3 % (ref 36.0–46.0)
Hemoglobin: 12.4 g/dL (ref 12.0–15.0)
Hemoglobin: 12.5 g/dL (ref 12.0–15.0)
Hemoglobin: 12.7 g/dL (ref 12.0–15.0)
Hemoglobin: 13.8 g/dL (ref 12.0–15.0)
MCHC: 32.3 g/dL (ref 30.0–36.0)
MCHC: 32.4 g/dL (ref 30.0–36.0)
MCHC: 32.5 g/dL (ref 30.0–36.0)
MCHC: 33.4 g/dL (ref 30.0–36.0)
MCV: 90 fL (ref 78.0–100.0)
MCV: 92.4 fL (ref 78.0–100.0)
MCV: 92.4 fL (ref 78.0–100.0)
MCV: 92.5 fL (ref 78.0–100.0)
Platelets: 181 10*3/uL (ref 150–400)
Platelets: 186 10*3/uL (ref 150–400)
Platelets: 189 10*3/uL (ref 150–400)
Platelets: 206 10*3/uL (ref 150–400)
RBC: 4.13 MIL/uL (ref 3.87–5.11)
RBC: 4.19 MIL/uL (ref 3.87–5.11)
RBC: 4.24 MIL/uL (ref 3.87–5.11)
RBC: 4.59 MIL/uL (ref 3.87–5.11)
RDW: 15.4 % (ref 11.5–15.5)
RDW: 15.6 % — ABNORMAL HIGH (ref 11.5–15.5)
RDW: 15.8 % — ABNORMAL HIGH (ref 11.5–15.5)
RDW: 15.8 % — ABNORMAL HIGH (ref 11.5–15.5)
WBC: 6.7 10*3/uL (ref 4.0–10.5)
WBC: 6.8 10*3/uL (ref 4.0–10.5)
WBC: 7.4 10*3/uL (ref 4.0–10.5)
WBC: 7.4 10*3/uL (ref 4.0–10.5)

## 2010-11-09 LAB — COMPREHENSIVE METABOLIC PANEL
ALT: 139 U/L — ABNORMAL HIGH (ref 0–35)
AST: 104 U/L — ABNORMAL HIGH (ref 0–37)
Albumin: 3.6 g/dL (ref 3.5–5.2)
Alkaline Phosphatase: 42 U/L (ref 39–117)
BUN: 8 mg/dL (ref 6–23)
CO2: 26 mEq/L (ref 19–32)
Calcium: 9 mg/dL (ref 8.4–10.5)
Chloride: 101 mEq/L (ref 96–112)
Creatinine, Ser: 1.61 mg/dL — ABNORMAL HIGH (ref 0.4–1.2)
GFR calc Af Amer: 41 mL/min — ABNORMAL LOW (ref >60)
GFR calc non Af Amer: 33 mL/min — ABNORMAL LOW (ref >60)
Glucose, Bld: 146 mg/dL — ABNORMAL HIGH (ref 70–99)
Potassium: 4.3 mEq/L (ref 3.5–5.1)
Sodium: 135 mEq/L (ref 135–145)
Total Bilirubin: 0.9 mg/dL (ref 0.3–1.2)
Total Protein: 7.9 g/dL (ref 6.0–8.3)

## 2010-11-09 LAB — BASIC METABOLIC PANEL
BUN: 11 mg/dL (ref 6–23)
BUN: 14 mg/dL (ref 6–23)
BUN: 8 mg/dL (ref 6–23)
CO2: 25 mEq/L (ref 19–32)
CO2: 27 mEq/L (ref 19–32)
CO2: 28 mEq/L (ref 19–32)
Calcium: 8.8 mg/dL (ref 8.4–10.5)
Calcium: 9 mg/dL (ref 8.4–10.5)
Calcium: 9.4 mg/dL (ref 8.4–10.5)
Chloride: 103 mEq/L (ref 96–112)
Chloride: 105 mEq/L (ref 96–112)
Chloride: 107 mEq/L (ref 96–112)
Creatinine, Ser: 1.56 mg/dL — ABNORMAL HIGH (ref 0.4–1.2)
Creatinine, Ser: 1.73 mg/dL — ABNORMAL HIGH (ref 0.4–1.2)
Creatinine, Ser: 1.96 mg/dL — ABNORMAL HIGH (ref 0.4–1.2)
GFR calc Af Amer: 32 mL/min — ABNORMAL LOW (ref >60)
GFR calc Af Amer: 37 mL/min — ABNORMAL LOW (ref >60)
GFR calc Af Amer: 42 mL/min — ABNORMAL LOW (ref >60)
GFR calc non Af Amer: 27 mL/min — ABNORMAL LOW (ref >60)
GFR calc non Af Amer: 31 mL/min — ABNORMAL LOW (ref >60)
GFR calc non Af Amer: 35 mL/min — ABNORMAL LOW (ref >60)
Glucose, Bld: 127 mg/dL — ABNORMAL HIGH (ref 70–99)
Glucose, Bld: 169 mg/dL — ABNORMAL HIGH (ref 70–99)
Glucose, Bld: 202 mg/dL — ABNORMAL HIGH (ref 70–99)
Potassium: 4.3 mEq/L (ref 3.5–5.1)
Potassium: 4.3 mEq/L (ref 3.5–5.1)
Potassium: 4.7 mEq/L (ref 3.5–5.1)
Sodium: 136 mEq/L (ref 135–145)
Sodium: 138 mEq/L (ref 135–145)
Sodium: 140 mEq/L (ref 135–145)

## 2010-11-09 LAB — DIGOXIN LEVEL
Digoxin Level: 1.2 ng/mL (ref 0.8–2.0)
Digoxin Level: 1.4 ng/mL (ref 0.8–2.0)
Digoxin Level: 2.5 ng/mL — ABNORMAL HIGH (ref 0.8–2.0)
Digoxin Level: 2.7 ng/mL (ref 0.8–2.0)

## 2010-11-09 LAB — POCT CARDIAC MARKERS
CKMB, poc: 1.3 ng/mL (ref 1.0–8.0)
CKMB, poc: <1 ng/mL — ABNORMAL LOW (ref 1.0–8.0)
Myoglobin, poc: 227 ng/mL (ref 12–200)
Myoglobin, poc: 363 ng/mL (ref 12–200)
Troponin i, poc: <0.05 ng/mL (ref 0.00–0.09)
Troponin i, poc: <0.05 ng/mL (ref 0.00–0.09)

## 2010-11-09 LAB — PROTIME-INR
INR: 2.38 — ABNORMAL HIGH (ref 0.00–1.49)
INR: 3.15 — ABNORMAL HIGH (ref 0.00–1.49)
INR: 3.57 — ABNORMAL HIGH (ref 0.00–1.49)
INR: 3.92 — ABNORMAL HIGH (ref 0.00–1.49)
INR: 4.01 — ABNORMAL HIGH (ref 0.00–1.49)
INR: 4.51 — ABNORMAL HIGH (ref 0.00–1.49)
Prothrombin Time: 25.8 seconds — ABNORMAL HIGH (ref 11.6–15.2)
Prothrombin Time: 32.1 seconds — ABNORMAL HIGH (ref 11.6–15.2)
Prothrombin Time: 35.4 seconds — ABNORMAL HIGH (ref 11.6–15.2)
Prothrombin Time: 38.1 seconds — ABNORMAL HIGH (ref 11.6–15.2)
Prothrombin Time: 38.8 seconds — ABNORMAL HIGH (ref 11.6–15.2)
Prothrombin Time: 42.5 seconds — ABNORMAL HIGH (ref 11.6–15.2)

## 2010-11-09 LAB — HEMOGLOBIN A1C
Hgb A1c MFr Bld: 7.7 % — ABNORMAL HIGH (ref 4.6–6.1)
Mean Plasma Glucose: 174 mg/dL

## 2010-11-09 LAB — DIFFERENTIAL
Basophils Absolute: 0 10*3/uL (ref 0.0–0.1)
Basophils Relative: 0 % (ref 0–1)
Eosinophils Absolute: 0.1 10*3/uL (ref 0.0–0.7)
Eosinophils Relative: 2 % (ref 0–5)
Lymphocytes Relative: 24 % (ref 12–46)
Lymphs Abs: 1.8 10*3/uL (ref 0.7–4.0)
Monocytes Absolute: 1.1 10*3/uL — ABNORMAL HIGH (ref 0.1–1.0)
Monocytes Relative: 15 % — ABNORMAL HIGH (ref 3–12)
Neutro Abs: 4.4 10*3/uL (ref 1.7–7.7)
Neutrophils Relative %: 59 % (ref 43–77)

## 2010-11-09 LAB — CK TOTAL AND CKMB (NOT AT ARMC)
CK, MB: 1.4 ng/mL (ref 0.3–4.0)
Relative Index: 0.6 (ref 0.0–2.5)
Total CK: 252 U/L — ABNORMAL HIGH (ref 7–177)

## 2010-11-09 LAB — APTT: aPTT: 32 seconds (ref 24–37)

## 2010-11-09 LAB — LIPID PANEL
Cholesterol: 82 mg/dL (ref 0–200)
HDL: 24 mg/dL — ABNORMAL LOW (ref >39)
LDL Cholesterol: 36 mg/dL (ref 0–99)
Total CHOL/HDL Ratio: 3.4 RATIO
Triglycerides: 111 mg/dL (ref <150)
VLDL: 22 mg/dL (ref 0–40)

## 2010-11-09 LAB — BRAIN NATRIURETIC PEPTIDE: Pro B Natriuretic peptide (BNP): 51 pg/mL (ref 0.0–100.0)

## 2010-11-09 LAB — D-DIMER, QUANTITATIVE: D-Dimer, Quant: 0.27 ug/mL-FEU (ref 0.00–0.48)

## 2010-11-09 LAB — TROPONIN I: Troponin I: <0.01 ng/mL (ref 0.00–0.06)

## 2010-11-14 LAB — LIPASE, BLOOD: Lipase: 12 U/L (ref 11–59)

## 2010-11-14 LAB — GLUCOSE, CAPILLARY
Glucose-Capillary: 107 mg/dL — ABNORMAL HIGH (ref 70–99)
Glucose-Capillary: 110 mg/dL — ABNORMAL HIGH (ref 70–99)
Glucose-Capillary: 111 mg/dL — ABNORMAL HIGH (ref 70–99)
Glucose-Capillary: 113 mg/dL — ABNORMAL HIGH (ref 70–99)
Glucose-Capillary: 117 mg/dL — ABNORMAL HIGH (ref 70–99)
Glucose-Capillary: 119 mg/dL — ABNORMAL HIGH (ref 70–99)
Glucose-Capillary: 121 mg/dL — ABNORMAL HIGH (ref 70–99)
Glucose-Capillary: 122 mg/dL — ABNORMAL HIGH (ref 70–99)
Glucose-Capillary: 124 mg/dL — ABNORMAL HIGH (ref 70–99)
Glucose-Capillary: 128 mg/dL — ABNORMAL HIGH (ref 70–99)
Glucose-Capillary: 129 mg/dL — ABNORMAL HIGH (ref 70–99)
Glucose-Capillary: 134 mg/dL — ABNORMAL HIGH (ref 70–99)
Glucose-Capillary: 134 mg/dL — ABNORMAL HIGH (ref 70–99)
Glucose-Capillary: 136 mg/dL — ABNORMAL HIGH (ref 70–99)
Glucose-Capillary: 137 mg/dL — ABNORMAL HIGH (ref 70–99)
Glucose-Capillary: 138 mg/dL — ABNORMAL HIGH (ref 70–99)
Glucose-Capillary: 140 mg/dL — ABNORMAL HIGH (ref 70–99)
Glucose-Capillary: 145 mg/dL — ABNORMAL HIGH (ref 70–99)
Glucose-Capillary: 145 mg/dL — ABNORMAL HIGH (ref 70–99)
Glucose-Capillary: 145 mg/dL — ABNORMAL HIGH (ref 70–99)
Glucose-Capillary: 154 mg/dL — ABNORMAL HIGH (ref 70–99)
Glucose-Capillary: 156 mg/dL — ABNORMAL HIGH (ref 70–99)
Glucose-Capillary: 156 mg/dL — ABNORMAL HIGH (ref 70–99)
Glucose-Capillary: 164 mg/dL — ABNORMAL HIGH (ref 70–99)
Glucose-Capillary: 95 mg/dL (ref 70–99)

## 2010-11-14 LAB — COMPREHENSIVE METABOLIC PANEL
ALT: 16 U/L (ref 0–35)
AST: 19 U/L (ref 0–37)
Albumin: 3.3 g/dL — ABNORMAL LOW (ref 3.5–5.2)
Alkaline Phosphatase: 82 U/L (ref 39–117)
BUN: 11 mg/dL (ref 6–23)
CO2: 25 mEq/L (ref 19–32)
Calcium: 9.1 mg/dL (ref 8.4–10.5)
Chloride: 103 mEq/L (ref 96–112)
Creatinine, Ser: 1.67 mg/dL — ABNORMAL HIGH (ref 0.4–1.2)
GFR calc Af Amer: 39 mL/min — ABNORMAL LOW (ref >60)
GFR calc non Af Amer: 32 mL/min — ABNORMAL LOW (ref >60)
Glucose, Bld: 167 mg/dL — ABNORMAL HIGH (ref 70–99)
Potassium: 3.7 mEq/L (ref 3.5–5.1)
Sodium: 137 mEq/L (ref 135–145)
Total Bilirubin: 0.7 mg/dL (ref 0.3–1.2)
Total Protein: 7.6 g/dL (ref 6.0–8.3)

## 2010-11-14 LAB — CBC
HCT: 32 % — ABNORMAL LOW (ref 36.0–46.0)
HCT: 32 % — ABNORMAL LOW (ref 36.0–46.0)
HCT: 32.2 % — ABNORMAL LOW (ref 36.0–46.0)
HCT: 32.4 % — ABNORMAL LOW (ref 36.0–46.0)
HCT: 33.1 % — ABNORMAL LOW (ref 36.0–46.0)
HCT: 33.2 % — ABNORMAL LOW (ref 36.0–46.0)
HCT: 35.5 % — ABNORMAL LOW (ref 36.0–46.0)
Hemoglobin: 10.6 g/dL — ABNORMAL LOW (ref 12.0–15.0)
Hemoglobin: 10.7 g/dL — ABNORMAL LOW (ref 12.0–15.0)
Hemoglobin: 10.7 g/dL — ABNORMAL LOW (ref 12.0–15.0)
Hemoglobin: 10.9 g/dL — ABNORMAL LOW (ref 12.0–15.0)
Hemoglobin: 11 g/dL — ABNORMAL LOW (ref 12.0–15.0)
Hemoglobin: 11.1 g/dL — ABNORMAL LOW (ref 12.0–15.0)
Hemoglobin: 12 g/dL (ref 12.0–15.0)
MCHC: 33 g/dL (ref 30.0–36.0)
MCHC: 33.1 g/dL (ref 30.0–36.0)
MCHC: 33.3 g/dL (ref 30.0–36.0)
MCHC: 33.4 g/dL (ref 30.0–36.0)
MCHC: 33.6 g/dL (ref 30.0–36.0)
MCHC: 33.7 g/dL (ref 30.0–36.0)
MCHC: 33.8 g/dL (ref 30.0–36.0)
MCV: 90.1 fL (ref 78.0–100.0)
MCV: 90.9 fL (ref 78.0–100.0)
MCV: 90.9 fL (ref 78.0–100.0)
MCV: 91 fL (ref 78.0–100.0)
MCV: 91.3 fL (ref 78.0–100.0)
MCV: 91.6 fL (ref 78.0–100.0)
MCV: 91.9 fL (ref 78.0–100.0)
Platelets: 159 10*3/uL (ref 150–400)
Platelets: 159 10*3/uL (ref 150–400)
Platelets: 166 10*3/uL (ref 150–400)
Platelets: 171 10*3/uL (ref 150–400)
Platelets: 191 10*3/uL (ref 150–400)
Platelets: 197 10*3/uL (ref 150–400)
Platelets: 207 10*3/uL (ref 150–400)
RBC: 3.5 MIL/uL — ABNORMAL LOW (ref 3.87–5.11)
RBC: 3.51 MIL/uL — ABNORMAL LOW (ref 3.87–5.11)
RBC: 3.52 MIL/uL — ABNORMAL LOW (ref 3.87–5.11)
RBC: 3.54 MIL/uL — ABNORMAL LOW (ref 3.87–5.11)
RBC: 3.64 MIL/uL — ABNORMAL LOW (ref 3.87–5.11)
RBC: 3.64 MIL/uL — ABNORMAL LOW (ref 3.87–5.11)
RBC: 3.93 MIL/uL (ref 3.87–5.11)
RDW: 14.7 % (ref 11.5–15.5)
RDW: 14.8 % (ref 11.5–15.5)
RDW: 15.1 % (ref 11.5–15.5)
RDW: 15.6 % — ABNORMAL HIGH (ref 11.5–15.5)
RDW: 15.6 % — ABNORMAL HIGH (ref 11.5–15.5)
RDW: 15.9 % — ABNORMAL HIGH (ref 11.5–15.5)
RDW: 15.9 % — ABNORMAL HIGH (ref 11.5–15.5)
WBC: 6.5 10*3/uL (ref 4.0–10.5)
WBC: 6.6 10*3/uL (ref 4.0–10.5)
WBC: 6.7 10*3/uL (ref 4.0–10.5)
WBC: 7.1 10*3/uL (ref 4.0–10.5)
WBC: 7.3 10*3/uL (ref 4.0–10.5)
WBC: 8.5 10*3/uL (ref 4.0–10.5)
WBC: 9.1 10*3/uL (ref 4.0–10.5)

## 2010-11-14 LAB — BASIC METABOLIC PANEL
BUN: 11 mg/dL (ref 6–23)
BUN: 9 mg/dL (ref 6–23)
BUN: 9 mg/dL (ref 6–23)
CO2: 27 mEq/L (ref 19–32)
CO2: 29 mEq/L (ref 19–32)
CO2: 29 mEq/L (ref 19–32)
Calcium: 8.2 mg/dL — ABNORMAL LOW (ref 8.4–10.5)
Calcium: 8.4 mg/dL (ref 8.4–10.5)
Calcium: 8.7 mg/dL (ref 8.4–10.5)
Chloride: 101 mEq/L (ref 96–112)
Chloride: 102 mEq/L (ref 96–112)
Chloride: 104 mEq/L (ref 96–112)
Creatinine, Ser: 1.41 mg/dL — ABNORMAL HIGH (ref 0.4–1.2)
Creatinine, Ser: 1.52 mg/dL — ABNORMAL HIGH (ref 0.4–1.2)
Creatinine, Ser: 1.68 mg/dL — ABNORMAL HIGH (ref 0.4–1.2)
GFR calc Af Amer: 39 mL/min — ABNORMAL LOW (ref >60)
GFR calc Af Amer: 43 mL/min — ABNORMAL LOW (ref >60)
GFR calc Af Amer: 47 mL/min — ABNORMAL LOW (ref >60)
GFR calc non Af Amer: 32 mL/min — ABNORMAL LOW (ref >60)
GFR calc non Af Amer: 36 mL/min — ABNORMAL LOW (ref >60)
GFR calc non Af Amer: 39 mL/min — ABNORMAL LOW (ref >60)
Glucose, Bld: 109 mg/dL — ABNORMAL HIGH (ref 70–99)
Glucose, Bld: 124 mg/dL — ABNORMAL HIGH (ref 70–99)
Glucose, Bld: 139 mg/dL — ABNORMAL HIGH (ref 70–99)
Potassium: 3.5 mEq/L (ref 3.5–5.1)
Potassium: 3.7 mEq/L (ref 3.5–5.1)
Potassium: 4.2 mEq/L (ref 3.5–5.1)
Sodium: 136 mEq/L (ref 135–145)
Sodium: 137 mEq/L (ref 135–145)
Sodium: 138 mEq/L (ref 135–145)

## 2010-11-14 LAB — CARDIAC PANEL(CRET KIN+CKTOT+MB+TROPI)
CK, MB: 1 ng/mL (ref 0.3–4.0)
CK, MB: 1.5 ng/mL (ref 0.3–4.0)
Relative Index: 0.7 (ref 0.0–2.5)
Relative Index: 1.3 (ref 0.0–2.5)
Total CK: 114 U/L (ref 7–177)
Total CK: 143 U/L (ref 7–177)
Troponin I: 0.01 ng/mL (ref 0.00–0.06)
Troponin I: 0.04 ng/mL (ref 0.00–0.06)

## 2010-11-14 LAB — PROTIME-INR
INR: 1.37 (ref 0.00–1.49)
INR: 1.46 (ref 0.00–1.49)
INR: 1.61 — ABNORMAL HIGH (ref 0.00–1.49)
INR: 1.7 — ABNORMAL HIGH (ref 0.00–1.49)
INR: 2.26 — ABNORMAL HIGH (ref 0.00–1.49)
INR: 3.01 — ABNORMAL HIGH (ref 0.00–1.49)
Prothrombin Time: 16.8 seconds — ABNORMAL HIGH (ref 11.6–15.2)
Prothrombin Time: 17.6 seconds — ABNORMAL HIGH (ref 11.6–15.2)
Prothrombin Time: 19 seconds — ABNORMAL HIGH (ref 11.6–15.2)
Prothrombin Time: 19.8 seconds — ABNORMAL HIGH (ref 11.6–15.2)
Prothrombin Time: 24.8 seconds — ABNORMAL HIGH (ref 11.6–15.2)
Prothrombin Time: 31 seconds — ABNORMAL HIGH (ref 11.6–15.2)

## 2010-11-14 LAB — DIFFERENTIAL
Basophils Absolute: 0 10*3/uL (ref 0.0–0.1)
Basophils Relative: 0 % (ref 0–1)
Eosinophils Absolute: 0.1 10*3/uL (ref 0.0–0.7)
Eosinophils Relative: 1 % (ref 0–5)
Lymphocytes Relative: 11 % — ABNORMAL LOW (ref 12–46)
Lymphs Abs: 0.8 10*3/uL (ref 0.7–4.0)
Monocytes Absolute: 0.6 10*3/uL (ref 0.1–1.0)
Monocytes Relative: 9 % (ref 3–12)
Neutro Abs: 5.6 10*3/uL (ref 1.7–7.7)
Neutrophils Relative %: 79 % — ABNORMAL HIGH (ref 43–77)

## 2010-11-14 LAB — APTT: aPTT: 29 seconds (ref 24–37)

## 2010-11-14 LAB — CK TOTAL AND CKMB (NOT AT ARMC)
CK, MB: 0.8 ng/mL (ref 0.3–4.0)
Relative Index: INVALID (ref 0.0–2.5)
Total CK: 73 U/L (ref 7–177)

## 2010-11-14 LAB — POCT CARDIAC MARKERS
CKMB, poc: <1 ng/mL — ABNORMAL LOW (ref 1.0–8.0)
CKMB, poc: <1 ng/mL — ABNORMAL LOW (ref 1.0–8.0)
Myoglobin, poc: 123 ng/mL (ref 12–200)
Myoglobin, poc: 156 ng/mL (ref 12–200)
Troponin i, poc: <0.05 ng/mL (ref 0.00–0.09)
Troponin i, poc: <0.05 ng/mL (ref 0.00–0.09)

## 2010-11-14 LAB — HEPARIN LEVEL (UNFRACTIONATED)
Heparin Unfractionated: 0.33 IU/mL (ref 0.30–0.70)
Heparin Unfractionated: 0.33 IU/mL (ref 0.30–0.70)
Heparin Unfractionated: 0.43 IU/mL (ref 0.30–0.70)
Heparin Unfractionated: 0.43 IU/mL (ref 0.30–0.70)
Heparin Unfractionated: 0.45 IU/mL (ref 0.30–0.70)

## 2010-11-14 LAB — T4, FREE: Free T4: 1.19 ng/dL (ref 0.80–1.80)

## 2010-11-14 LAB — TSH: TSH: 3.733 u[IU]/mL (ref 0.350–4.500)

## 2010-11-14 LAB — BRAIN NATRIURETIC PEPTIDE
Pro B Natriuretic peptide (BNP): 278 pg/mL — ABNORMAL HIGH (ref 0.0–100.0)
Pro B Natriuretic peptide (BNP): 393 pg/mL — ABNORMAL HIGH (ref 0.0–100.0)

## 2010-11-14 LAB — TROPONIN I: Troponin I: <0.01 ng/mL (ref 0.00–0.06)

## 2011-01-03 NOTE — Assessment & Plan Note (Signed)
[facility]   NAME:  [name], [name]        ACCOUNT NO.:  [id number]   MEDICAL RECORD NO.:  [id number]      DATE OF BIRTH:  27-Oct-1955   PHYSICIAN:  Elesa Hacker, M.D.         VISIT DATE:  03/10/2009                                   OFFICE VISIT   HISTORY OF PRESENT ILLNESS:  A 55 year old female with morbid obesity  with several abrasions and pressure wounds of left lower extremity,  posterior thigh.   PHYSICAL EXAMINATION:  The patient is afebrile.  Pulse is 76,  respirations 20, and blood pressure 137/82.  The wounds on the thigh are  healed.   PLAN:  The patient is basically discharged from the clinic with warnings  about pressure ulceration.  She is also pumping twice daily to relieve  lymphedema of the lower extremities and we will measure her for support  stockings.      Elesa Hacker, M.D.  Electronically Signed     RA/MEDQ  D:  03/10/2009  T:  03/10/2009  Job:  [id number]

## 2011-01-03 NOTE — Assessment & Plan Note (Signed)
[facility]   NAME:  [name], [name] NO.:  [id number]   MEDICAL RECORD NO.:  [id number]      DATE OF BIRTH:  1955/10/03   PHYSICIAN:  Ricard Dillon, M.D. VISIT DATE:  08/28/2008                                   OFFICE VISIT   [name] is a lady that we have been following for severe  lymphedema.  She actually has severe creases in her popliteal fossa that  has resulted in linear nonhealing wounds.  She has been treating the  popliteal areas with a Dove soap wash, drying the skin thoroughly,  applying A&D Ointment, and long strips of lambswool.  It appears that  this is making decent progress with regard to these wounds.  Unfortunately, in the meantime, she has developed a new wound in the  left medial thigh.  This is probably a friction-type injury, perhaps  aggravated by some degree of moisture.  There is some drainage coming  from this area and some discomfort.   I have also spent a fair amount today researching the status of her  external compression pump for her chronic severe lower extremity  lymphedema.  Apparently since we are basically classifying this as  venous stasis in the lower extremities, there has been a delay in of up  to 6 months of actual wound documentation necessary for Medicare to  cover this.  We apparently did not get any documentation of actual wound  description including lengths, description of wounds, etc, from her  primary physician's office which might circumvent this long root and  actually looking at her legs, I think this is all severe lymphedema  rather than venous stasis in any case.  Nevertheless, there has been a  delay in getting external compression pump which might help in a  preventative sense.   PHYSICAL EXAMINATION:  VITAL SIGNS:  Her temperature is 97.9 and blood  pressure 148/87.  EXTREMITIES:  Both her legs have severe lymphedema with peau d'orange  type appearance.  The area in  the popliteal fossa on the right measures  0.2 x 0.5 x 0.1.  In the left popliteal space, it is 0.2 x 3.5.  I think  these wounds actually look fairly healthy to me.  There is no drainage.  No odor and I believe there is epithelialization in both of these.   The new area is on the medial left upper thigh.  This measures 2.4 x 1.8  x 0.2.  This actually has a fairly large degree of wound area.  There is  some irritation of the surrounding skin, but no drainage is obviously  seen.  There is no obvious cellulitis.   IMPRESSION:  Severe lymphedema with largely lymphedematous ulcers and  traumatic ulcers.  The most concerning area to me is on the medial left  thigh.  We applied topical antibiotic and nonadhesive dressing to this  which she will change with the assistance of her daughter.  The areas in  her popliteal fossa continue the same regimen as outlined last time by  Dr. Sharlett Iles including cleaning and drying, A&D ointment, lambswool.  I  think these are on their way to resolving.  We will see her again in 2  weeks' time.  ______________________________  Ricard Dillon, M.D.     MGR/MEDQ  D:  08/28/2008  T:  08/28/2008  Job:  [id number]

## 2011-01-03 NOTE — Assessment & Plan Note (Signed)
[facility]   NAME:  [name], [name]        ACCOUNT NO.:  [id number]   MEDICAL RECORD NO.:  [id number]      DATE OF BIRTH:  1956/08/09   PHYSICIAN:  Ricard Dillon, M.D. VISIT DATE:  01/07/2009                                   OFFICE VISIT   [name] is a 55 year old woman with morbid obesity and chronic  severe lower extremity bilateral edema and ulcerations because of this.  She had been followed here for largely ulcers over the posterior aspect  of her left thigh and in the left popliteal fossa in the crevices.  She  has been treated with topical wound care and really has done very well.   On examination, she is afebrile.  There is 1 tiny open area on the left  upper thigh posteriorly, again in a pannus cleft.  I do not think this  looks too serious, and I think it should heal over if simply friction  pressure is removed.   IMPRESSION:  Wounds related to chronic lymphedema and morbid obesity.  She has 1 tiny open area that we simply put antibacterial cream on and a  nonstick dressing.  We have spent sometime seeing if she is going to be  eligible for lymphedema pumps.  We have a company called, [employer], who will go out to see the patient and help with her  insurance related issues.  Otherwise, I think this lady is simply too  large for any form of compression wraps and will have recurrent  ulcerations, will require continuous trips to see physicians, usually at  a [facility].  I do not think this could be easily dealt with in  a primary office.  I have given her an appointment for 2 weeks and  followup of these issues.           ______________________________  Ricard Dillon, M.D.     MGR/MEDQ  D:  01/07/2009  T:  01/08/2009  Job:  [id number]

## 2011-01-03 NOTE — Assessment & Plan Note (Signed)
[facility]   NAME:  MICHELL, PACHON        ACCOUNT NO.:  [id number]   MEDICAL RECORD NO.:  [id number]      DATE OF BIRTH:  1955-08-30   PHYSICIAN:  Ricard Dillon, M.D. VISIT DATE:  12/24/2008                                   OFFICE VISIT   Ms. [name] is a 55 year old woman with severe morbid obesity, chronic  lymphedema, and stasis ulcerations.  She has pressure areas over her  left thigh posteriorly and in the left popliteal fossa in the crevices.  She has been treated with topical wound care and really has done very  well.   On examination, her temperature is 97.8.  The areas on the left thigh  posteriorly in the left popliteal area have all healed over.  There are  no open areas present.   IMPRESSIONS:  Wounds related to chronic lymphedema and morbid obesity.  These have resolved.  I wondered about what we can do for ongoing  maintenance.  I think the only issue here would be to provide external  compression pumps.  We will see her back one more time in 2 weeks to see  if we can look this over and help the patient obtain these.  She has  Medicare and Medicaid available.           ______________________________  Ricard Dillon, M.D.     MGR/MEDQ  D:  12/24/2008  T:  12/25/2008  Job:  [id number]

## 2011-01-03 NOTE — Assessment & Plan Note (Signed)
[facility]   NAME:  [name], [name]        ACCOUNT NO.:  [id number]   MEDICAL RECORD NO.:  [id number]      DATE OF BIRTH:  1956-03-18   PHYSICIAN:  Elesa Hacker, M.D.         VISIT DATE:  02/03/2009                                   OFFICE VISIT   HISTORY OF PRESENT ILLNESS:  This 55 year old female with morbid obesity  has had several what appeared to be abrasions and pressure wounds on the  left lower extremity posterior thigh.   PHYSICAL EXAMINATION:  A very obese female with a 0.5 x 1.5 loss of skin  in the left upper thigh in a fat fold.  The wound is relatively clean.   PLAN:  Allevyn pad.  This is basically a pressure wound from skin  rubbing against skin.  I have spoken to her husband about dressing  changes p.r.n.      Elesa Hacker, M.D.  Electronically Signed     RA/MEDQ  D:  02/03/2009  T:  02/04/2009  Job:  [id number]

## 2011-01-03 NOTE — Assessment & Plan Note (Signed)
[facility]   NAME:  Leslie, Gallagher NO.:  [id number]   MEDICAL RECORD NO.:  [id number]      DATE OF BIRTH:  08/07/1956   PHYSICIAN:  Ermalene Searing. Philip Aspen, M.D.     VISIT DATE:                                   OFFICE VISIT   SUBJECTIVE:  The patient is a 55 year old woman of African American  descent who has been followed by Dr. Montez Morita and was seen for evaluation  of weeping lesions in the bilateral popliteal creases.  She has morbid  obesity as well as diabetes mellitus, type 2, hypertension, and  gastroesophageal reflux disease.  She has been treating the popliteal  areas daily with Dial soap wash, then drying the skin thoroughly, then  applying A and D ointment and a long strip of lambs wool in the  popliteal crease.  It was advised that once she obtains a lymphatic  pump, she can do this twice daily.  Currently, she notes that she has  not been able to obtain the lymphatic pump associated with  miscommunications with her primary care physician.  She has not had  significant pain in the legs.   OBJECTIVE:  VITAL SIGNS:  Blood pressure 133/83, pulse 90, respirations  24, temperature 97.7.  The condition of the wounds are as follows:  Wound #1:  It is located in the left popliteal space that is deep within  a fold of the skin.  The lesion measures 0.2 cm x 2.0 cm x 0.1 cm.  There is no significant eschar or necrotic material.  There is a red  wound base with red granulation tissue, no exposed bone, tendon, or  muscle.  No foul odor, and a small amount of serosanguineous discharge.  The periwound integrity was intact.  The right popliteal space did not  have ulceration or skin breakdown.   ASSESSMENT:  Continued small ulcer that is superficial affecting the  left lower extremity.   PLAN:  She was advised to after showering or bathing apply Lotrimin  cream, then A and D ointment to the knee creases of both legs, and then  lambs along the  creases to keep air exposed to the wounds.  She was then  advised to obtain either a Fishnet wrap to hold the dressing in place or  Hypafix.  This should be done to both popliteal creases once daily.  She  is advised to schedule a physician reevaluation here in approximately 1  month and call sooner if her condition should worsen.           ______________________________  Ermalene Searing Philip Aspen, M.D.     DGP/MEDQ  D:  07/07/2008  T:  07/08/2008  Job:  [id number]

## 2011-01-03 NOTE — Consult Note (Signed)
NAMEVIVYANA, [name]         ACCOUNT NO.:  [id number]   MEDICAL RECORD NO.:  [id number]          PATIENT TYPE:  REC   LOCATION:  FOOT                         FACILITY:  [facility]   PHYSICIAN:  Orlando Penner. Sevier, M.D. DATE OF BIRTH:  09-13-1955   DATE OF CONSULTATION:  06/17/2008  DATE OF DISCHARGE:                                 CONSULTATION   HISTORY:  This 55 year old black female is referred at the courtesy of  Dr. Montez Morita for assistance with the management of weeping lesions in the  popliteal creases bilaterally.   The patient has been plagued by morbid obesity for a number of years and  for months now has had difficulty with draining and open skin lesions in  the popliteal areas bilaterally.  These have been treated simply by  attempts to keep the area clean and dry but with not much satisfaction.  She has apparently at no point had any deep infection or any other  complications of this.   It is noted that the patient has type 2 diabetes, but her control is  said to be fairly satisfactory.  We do not have a copy, however, of  hemoglobin A1c.   PAST MEDICAL HISTORY:  The patient has had cholecystectomy,  hysterectomy, and exploratory laparotomy greater than 10 years ago.  She  has had some recent dental surgery from which she appears to be healing  satisfactorily.  She apparently has not been considered for any  bariatric surgery.   Other hospitalizations have been none.   Her regular medications include:  1. Famotidine 1 daily.  2. Carvedilol one twice daily.  3. Hyzaar 1 daily.  4. Clorazepate 1 three times daily.  5. Paroxetine 1 daily.  6. Avandamet 1 twice daily.  7. Tylenol with Codeine in relationship to her dental surgery on a      p.r.n. basis.   Medicinal allergies include SULFA which leads to itching.   Family history is not reviewed in detail at this time but apparently is  positive for diabetes and hypertension.   PERSONAL HISTORY:  The patient is  married, does not smoke, use alcohol  or recreational drugs.  She is not currently employed.  She has not had  flu shot this year and has never had a pneumonia vaccine.  She has no  history of radiation or chemotherapy.   REVIEW OF SYSTEMS:  The patient has chronic asthma, which apparently is  more a dyspnea related to her weight than anything else, and she does  not require any long-term medication for this.  She is known to be  hypertensive and is on medication as indicated above.  She does have  some gastroesophageal reflux.  She uses oxygen 2 liters per minute with  activity at home but does not need this on a full-time basis.  She  apparently has not been anemic or had any other blood disorders of which  she is aware.  She does know of enlarged heart but has never been told  that she has any heart valve disease or leaky valves or coronary artery  disease.   PHYSICAL EXAMINATION:  VITAL  SIGNS:  Blood pressure is 173/78, pulse is  91, respirations are 20, and temperature is 97.5.  GENERAL:  The patient is morbidly obese but in no immediate distress at  the moment.  LUNGS:  Grossly clear without wheezing.  HEART:  Her heart size is indeterminate because of her degree of obesity  and her tones are rather soft, but I do hear clearly a fixed S4 and a  pansystolic flow murmur.  EXTREMITIES:  She has some chronic edema in her lower extremities as  well as a degree of what I would think is quite likely a lymphedema  secondary to her morbid obesity.   The popliteal spaces are both moist and with an evidence of chronic skin  irritation.  In the left popliteus, is an open area of superficial  ulceration, measuring 0.6 x 1.1 x 0.1 cm.  There does not appear to be  any deeper secondary infection here.   The pulses in her feet are not easily palpable given her degree of  obesity, but on handheld Doppler examination, she has biphasic pulses at  the dorsalis pedis and posterior tibial  locations bilaterally.   IMPRESSION:  Morbid obesity with chronic venous and lymphatic  insufficiency, now with weeping and ulceration in the left popliteal  area.   DISPOSITION:  1. It is discussed with the patient that probably a long-term lower      extremity pump therapy would be to her advantage and if her heart      will tolerate it and arrangements are being made to obtain this      form of therapy.  She is told today and will be told again when      such therapies begun that she should notify us and/or Dr. Montez Morita      immediately should she have any increase in shortness of breath or      anything else to make her think treatments are not being well      tolerated.   In the interim, she is to cleanse and she will have her daughter assist  her with the popliteal areas daily with Dial soapy water and rinse them  thoroughly, pat them dry, hold the skin apart to allow them further to  air dry, then treat the area with an application of A&D ointment and  with a long strip of  lambswool in that popliteal crease.  Once she  begins pump therapy, she will do this twice daily, each time at the end  of her pump sessions.   She is given an appointment back here in 2 weeks, by which time  hopefully the pumps will have been obtained, and we can start her  therapy and monitor her progress.           ______________________________  Orlando Penner London Pepper, M.D.     RES/MEDQ  D:  06/17/2008  T:  06/17/2008  Job:  [id number]   cc:   Ardyth Gal. Spruill, M.D.

## 2011-01-03 NOTE — Procedures (Signed)
[name], [name]         ACCOUNT NO.:  [id number]   MEDICAL RECORD NO.:  [id number]          PATIENT TYPE:  OUT   LOCATION:  SLEEP CENTER                 FACILITY:  [facility]   PHYSICIAN:  Clinton D. Annamaria Boots, MD, FCCP, FACPDATE OF BIRTH:  Jun 01, 1956   DATE OF STUDY:  05/09/2007                            NOCTURNAL POLYSOMNOGRAM   REFERRING PHYSICIAN:  Ardyth Gal. Spruill, M.D.   INDICATION FOR STUDY:  Insomnia with sleep apnea.   EPWORTH SLEEPINESS SCORE:  11/24.  Height 5 feet, 6 inches.  Weight 350  pounds.   MEDICATIONS:  Home medications listed and reviewed.   SLEEP ARCHITECTURE:  Short total sleep time 123 minutes with sleep  efficiency 34%.  Stage I was 18%.  Stage II 15%.  Stages III and REM  were absent.  Sleep latency 62 minutes.  REM latency 218 minutes.  Awake  after sleep onset 169 minutes.  Arousal index 26.3 indicated increased  EEG arousal.  Sleep onset was not sustained until 1:30 A.M. and  subsequent sleep was marked by very frequent awakenings.  Ambien 12.5 CR  was taken at 10:10 P.M.  Note that a previous diagnostic study on  May 09, 2005 was marked by similar difficulty initiating and  maintaining sleep with an apnea/hypopnea index then of 8.6.   RESPIRATORY DATA:  Apnea/hypopnea index (AHI/RDI) 22.4 obstructive  events per hour indicating moderate obstructive sleep apnea/hypopnea  syndrome.  This reflected a total of 46 obstructive events, all  hypopnea's.  She slept only on her back.  Because of delayed sleep  onset, split protocol CPAP titration could not be performed.   OXYGEN DATA:  Moderate snoring with oxygen desaturation nadir of 85%.  Mean oxygen saturation through the study was 92% on room air.   CARDIAC DATA:  Sinus rhythm with occasional PVC.   MOVEMENT-PARASOMNIA:  No significant movement disturbance.   IMPRESSIONS-RECOMMENDATIONS:  1. Short total sleep time despite Ambien.  The patient had awakened on      the day of the study at  12:00 noon with no subsequent naps, suggest      education on appropriate sleep hygiene.  2. Delay in sleep onset could not be attributed to sleep apnea,      however, sleep disorder breathing may have contributed to the      fragmented sleep with frequent waking's subsequently.  3. Moderate obstructive sleep apnea, apnea/hypopnea index 22.4 per      hour with all events being hypopnea's and all sleep and events      recorded supine.  Moderate snoring with oxygen desaturation to the      nadir of 85%.  4. She will be a candidate for CPAP titration based on her      apnea/hypopnea index of 22.4.  Weight loss is also likely to      contribute significantly and technician noted the patient is a      candidate for bariatric surgery.  If formal      CPAP titration is not accomplished by the time of surgery, then      consider use of an auto titration machine or empiric setting of 10      CWP  during the surgical interval.      Clinton D. Annamaria Boots, MD, [facility], [place], Tax adviser of Sleep Medicine  Electronically Signed     CDY/MEDQ  D:  05/19/2007 11:21:29  T:  05/19/2007 13:25:44  Job:  [id number]   cc:   Ardyth Gal. Spruill, M.D.  Fax: [phone]

## 2011-01-03 NOTE — Assessment & Plan Note (Signed)
[facility]   NAME:  [name], [name]        ACCOUNT NO.:  [id number]   MEDICAL RECORD NO.:  [id number]      DATE OF BIRTH:  Jan 28, 1956   PHYSICIAN:  Orlando Penner. Sevier, M.D.       VISIT DATE:                                   OFFICE VISIT   HISTORY:  This 55 year old black female morbidly obese with a chronic  lymphedema is seen with pressure ulcers on the high inner left thigh  posteriorly just adjacent to the buttock crease and also in the left  popliteus.  In addition today, she points out a swollen area on the  posterior left thigh about midway that is somewhat tender.   She has been treated with application of Bactroban to the high thigh  lesion protected by a nonstick pad, and she has been using lambswool in  the popliteal area to look out moisture and try to deal with this area  effectively.   PHYSICAL EXAMINATION:  Blood pressure is 150/78, pulse is 84,  respirations 20, temperature 98.1.  On the high posterior left thigh are  several small areas, which in aggregate measure 2.0 x 5.0 x 0.1 cm.  They are not inflamed or tender.   The popliteal area on the left in the posterior popliteal crease is a  1.3 x 1.5 cm inflamed area.   On the left posterior thigh, there is indeed a small cystic feeling  structure on palpation, which measures 1.4 x 1.4 x 0.3 cm and is  slightly tender but not warm or fluctuant.   IMPRESSION:  Pressure lesions on high left thigh and left popliteus in  the woman who is morbidly obese and who has lymphedema.   Probable sebaceous cyst, left posterior thigh, question early infection.   DISPOSITION:  The high posterior thigh wound and the popliteal wounds  require no debridement today.  They will both be treated with  applications of Bactroban and the one in the hyphae region covered with  a nonstick pad and the popliteal lesion treated with an application of  lambswool in that crease.  These dressings are to be changed once  daily  at home.   The cyst on the left posterior thigh is attempted to be aspirated today  following cleansing the area with iodine and anesthesia with 1% injected  lidocaine.  Nothing could be aspirated through an 18-gauge needle, and I  at that point switch then to a #11 blade, which I swept through the  entirety of the mass area encountering some resistance and some ropy  feeling changes but draining no purulence whatsoever.  My impression is  this is likely a sebaceous cyst possibly early infected but with no  accumulated pus.   That area will be treated with again application of Bactroban and a Band-  Aid, and she is advised to soak the area with hot moist soaks for 15  minutes twice daily.  She is placed on cephalexin 500 mg t.i.d. #15.   Followup visit will be here in 2 weeks or sooner if need be.           ______________________________  Orlando Penner. London Pepper, M.D.     RES/MEDQ  D:  11/25/2008  T:  11/26/2008  Job:  [id number]

## 2011-01-03 NOTE — Assessment & Plan Note (Signed)
[facility]   NAME:  [name], [name]        ACCOUNT NO.:  [id number]   MEDICAL RECORD NO.:  [id number]      DATE OF BIRTH:  December 01, 1955   PHYSICIAN:  Viviann Spare. Nyoka Cowden, M.D.        VISIT DATE:                                   OFFICE VISIT   HISTORY:  A 54 year old female returns for re-inspection of multiple  wounds which were quite shallow.  Wound #2 of the left thigh posterior  area and the upper posterior thigh close to the ischium are shallow  wounds multiple in number with the extent of area involving being 2.5 x  3.5 x 0.1 cm.  There is a wound #3 of the left popliteal area measuring  1.5 x 2.5 x 0.1 cm in depth which also is quite shallow.  Previous wound  #1 in the right popliteal area appears to be fully resolved.  The  patient says that the wound areas continue to be tender, however, this  has not much changed than previous.   NOTE:  VITAL SIGNS:  Pulse 80, respirations 20, blood pressure 151/83,  blood glucose 91.  Wound measurements as noted above.   TREATMENT:  Wound #2 of the left thigh, we had a Bactroban and Telfa  applied to it.  This should be done daily at home.  Wound #3 of the left  popliteal area, had Lamisil applied to that and this should be done  daily at home.  Because of some inflammation around the wounds, a  prescription written for doxycycline 100 mg one twice daily for 1 week.  We also prescribed Vicodin 5/500 #30 tablets to be taken one at four  times daily if needed for pain.   We discussed diet, obesity and she states that she also has heart  failure and is under evaluation by Dr. Montez Morita for this.   Review of medications indicate that she is taking Avandamet twice daily.  Because of the persistent edema, at this moment it may be wise to  consider alternative diabetic treatment due to the fact this drug is  sometimes been associated with worsening of edema.   ICD-9 code number 707.11 ulcer left thigh.  707.19 ulcer of  the left popliteal area.  278.01 morbid obesity.  457.1 lymphedema.  250.82 non-insulin-dependent diabetes mellitus with other  manifestations.  428.0 congestive heart failure.   CPT code [phone].     Viviann Spare Nyoka Cowden, M.D.  Electronically Signed    AGG/MEDQ  D:  09/11/2008  T:  09/12/2008  Job:  [id number]   cc:   Ardyth Gal. Spruill, M.D.

## 2011-01-06 NOTE — Procedures (Signed)
[name], [name]        ACCOUNT NO.:  [id number]   MEDICAL RECORD NO.:  [id number]          PATIENT TYPE:  OUT   LOCATION:  SLEEP CENTER                 FACILITY[facility]   PHYSICIAN:  Clinton D. Annamaria Boots, M.D. DATE OF BIRTH:  [birth year]   DATE OF STUDY:  05/09/2005                              NOCTURNAL POLYSOMNOGRAM   REFERRING PHYSICIAN:  Dr. Wallene Huh.   DATE OF STUDY:  May 09, 2005.   INDICATION FOR STUDY:  Hypersomnia with sleep apnea. Epworth sleepiness  score 19/24, BMI of 56, weight 350 pounds.   SLEEP ARCHITECTURE:  Short total sleep time 203 minutes with sleep  efficiency 42%. Stage I was 17%, stage II 80%, stages III and IV absent, REM  3% of total sleep time. Sleep latency 41 minutes, REM latency 369 minutes,  awake after sleep onset 245 minutes (4 hours), arousal index 18.9. No  bedtime medication was reported.   RESPIRATORY DATA:  Apnea/hypopnea index (AHI, RDI) 8.6 obstructive events  per hour indicating mild obstructive sleep apnea/hypopnea syndrome. There  were no apneas and 29 hypopneas. Events were most frequent while lying on  right side but were present in all positions. REM AHI 0. She did not  maintain sleep adequately for CPAP titration protocol.   OXYGEN DATA:  Moderate snoring with oxygen desaturation to a nadir of 82%.  Mean oxygen saturation through the study was 95% on room air.   CARDIAC DATA:  Sinus rhythm with occasional PVCs and some mild sinus  tachycardia up to 101 minutes.   MOVEMENT/PARASOMNIA:  Occasional leg jerk with insignificant effect on  sleep.   IMPRESSION/RECOMMENDATIONS:  1.  Difficulty initiating and maintaining sleep with very frequent awakening      and sustained wakefulness throughout the night, nonspecific.  2.  Mild obstructive sleep apnea/hypopnea syndrome limited to hypopneas and      primarily noted in the last hour of sleep where she was more      successfully maintaining sleep and on her right  side.  3.  It is likely that use of additional sedating medications to relieve the      insomnia component would aggravate her      sleep apnea. She may be appropriate to return for CPAP titration      bringing additional sleep medication if she does so. Otherwise consider      alternative therapies including improved nasal airway and weight loss.      Clinton D. Annamaria Boots, M.D.  [place]  Electronically Signed     CDY/MEDQ  D:  05/14/2005 13:35:06  T:  05/14/2005 23:24:27  Job:  [id number]

## 2011-01-06 NOTE — Op Note (Signed)
[name], [name]                            ACCOUNT NO.:  [id number]   MEDICAL RECORD NO.:  [id number]                   PATIENT TYPE:  OIB   LOCATION:  NA                                   FACILITY:  [facility]   PHYSICIAN:  Kathrin Penner, M.D.                DATE OF BIRTH:  02/27/1956   DATE OF PROCEDURE:  09/08/2002  DATE OF DISCHARGE:                                 OPERATIVE REPORT   PREOPERATIVE DIAGNOSIS:  Symptomatic chronic calculus cholecystitis.   POSTOPERATIVE DIAGNOSIS:  Symptomatic chronic calculus cholecystitis.   OPERATION/PROCEDURE:  Laparoscopic cholecystectomy with attempted  cholangiogram.   ASSISTANT:  Sharyn Dross, RNSA.   ANESTHESIA:  General.   INDICATIONS FOR PROCEDURE:  This patient is a 55 year old woman with morbid  obesity weighing greater than 310 pounds who presents with epigastric right  upper quadrant and right subscapular pain, associated nausea but without any  significant amount of emesis.  Her gallbladder ultrasound shows the  cholelithiasis and a thin-walled gallbladder.  She comes to the operating  room now after the risks and potential benefits of surgery have been fully  discussed.  All questions answered and consent obtained for laparoscopic  cholecystectomy. She is aware that open cholecystectomy may be necessary.   DESCRIPTION OF PROCEDURE:  Following the induction of satisfactory general  anesthesia with the patient positioned supine, the abdomen is routinely  prepped and draped to be included in the sterile operative field.  Open  laparoscopy created at the umbilicus with insertion of a Hasson cannula and  insufflation of the peritoneal cavity to 15 mmHg pressure using carbon  dioxide.  The abdomen was explored and the liver edges were somewhat  rounded.  The liver surface was smooth.  The anterior gastric wall and  duodenum appeared to be normal.  None of the small or large intestine viewed  appeared to be abnormal.  The pelvic  organs could not be visualized.  Under  direct vision, the epigastric and lateral ports were placed.  The  gallbladder was grasped and retracted cephalad.  I had to put in a fixed  port in the midline so as to retract the transverse colon and omentum in the  caudad direction so as to carry the dissection of the gallbladder down to  the ampulla.  The gallbladder was grasped and retracted in the cephalad  direction, dissection carried down around the ampulla, isolating the cystic  artery which was doubly clipped and transected and the cystic duct, which  was rather small.  The cystic duct was clipped proximally and opened  however, the cholangiocatheters that were used were actually larger than the  cystic duct and I could not dilate the cystic duct sufficiently to get them  in.  The window between the cystic duct and the gallbladder was clear.  The  cystic duct was then doubly clipped and transected and the gallbladder then  dissected free from the liver bed maintaining hemostasis with  electrocautery.  At the end of the dissection, the right upper quadrant was  thoroughly checked.  The liver bed was also checked.  Additional bleeding  points were treated with electrocautery.  The right upper quadrant was then  thoroughly irrigated with normal saline.  The gallbladder was placed in an  endo-pouch.  The camera removed from the epigastric port and the gallbladder  was retrieved through the umbilical port without difficulty.  All trocars  were removed under direct vision.  The sponge and instrument and sharp  counts were verified.  The pneumoperitoneum was allowed to deflate and the  wounds were closed in layers as follows:  The umbilical wound was closed in  two layers with 0 Dexon and 4-0 Dexon.  Epigastric lateral flank wounds and  the fifth port, midline port were all closed  with 4-0 Dexon sutures.  All wounds were then reinforced with Steri-Strips.  Sterile dressings were applied.  The  anesthetic was reversed and the patient  removed from the operating room to the recovery room in stable condition.  She tolerated the procedure well.                                                Kathrin Penner, M.D.    PB/MEDQ  D:  09/08/2002  T:  09/08/2002  Job:  [id number]

## 2011-01-06 NOTE — Op Note (Signed)
NAMETIANNI, LUCKENBAUGH         ACCOUNT NO.:  [id number]   MEDICAL RECORD NO.:  [id number]          PATIENT TYPE:  AMB   LOCATION:  DAY                          FACILITY:  [facility]   PHYSICIAN:  Lauretta Grill, M.D.    DATE OF BIRTH:  08-09-1956   DATE OF PROCEDURE:  02/08/2006  DATE OF DISCHARGE:                                 OPERATIVE REPORT   PREOPERATIVE DIAGNOSIS:  Right carpal tunnel syndrome.   POSTOPERATIVE DIAGNOSIS:  Right carpal tunnel syndrome.   PROCEDURE:  Right carpal tunnel release.   SURGEON:  1.  Hiram Comber, M.D.   ASSISTANT:  Pedro Earls, P.A.-C.   ANESTHESIA:  IV regional with sedation and additional local block.   CULTURES:  None.   DRAINS:  None.   ESTIMATED BLOOD LOSS:  Minimal.   TOURNIQUET TIME:  35 minutes.   PATHOLOGIC FINDINGS AND HISTORY:  [name] is a somewhat obese female with  diabetes who has had chronic symptoms of carpal tunnel syndrome greater than  six months with positive nerve conduction studies.  She desired surgical  release.  Other conservative measures have failed.  At surgery, we found  classic findings of an hourglass median nerve with significant redness of  the nerve.  It released nicely.   DESCRIPTION OF PROCEDURE:  After adequate anesthesia obtained using IV  regional technique, 1 g Ancef given IV prophylaxis, the patient was placed  in supine position.  The right upper extremity was prepped from the  fingertips to the upper forearm.  After standard prepping and draping, an  initial incision was then made in the mid palm down to the distal wrist  flexion crease, longitudinal at the base.  She still had some pain so we  elected to proceed with local anesthetic with 1% Xylocaine plain.  Incision  was then deepened sharply with a knife and hemostasis was obtained using the  Bovie electrocoagulator.  Under Loupe magnification, dissection was carried  down to the palmar fascia which was released longitudinally with the  protection of a Soil scientist over the top of the nerve.  Dissection was  carried down to the median nerve which was then released with tenotomy  scissors proximally and distally with epineurectomy carried out over the  volar one half of the nerve.  We transferred all branches distally including  the motor branch.  We then released the distal wrist flexor retinaculum on  the ulnar side of the nerve up into the forearm.  The wound was irrigated.  Bleeding points cauterized.  The skin of the wound was closed with running  locking interrupted 4-0 nylon with bulky sterile compressive dressing with  volar plaster splint in slight cockup.  The patient then having tolerated  the procedure well, was awakened, taken to recovery room in satisfactory  condition.  She will be discharged per outpatient routine if she was stable  postoperatively.   Her laboratory data was stable preoperatively, although she had a history of  a heart attack in the past but she was stable for surgery.  This was why she  was done in the inpatient setting for observation.  ______________________________  V. Hiram Comber, M.D.     VEP/MEDQ  D:  02/08/2006  T:  02/08/2006  Job:  [id number]
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
## 2011-01-06 NOTE — Op Note (Signed)
[facility]  Patient:    [name], [name]                    MRN: [id number] Proc. Date: 02/04/01 Adm. Date:  [id number] Attending:  Nicholaus Bloom CC:         Vernell Leep, M.D.   Operative Report  PROCEDURE:  L-5 nerve root block on the left side.  DIAGNOSIS:  Low back pain with underlying facet joint arthropathy.  ANESTHESIOLOGIST:  Nicholaus Bloom, M.D.  INTERVAL HISTORY:  We obtained records from Dr. Terrence Dupont and from Dr. Montez Morita, and there is no apparent description of any coagulopathy that we can appreciate.  She has been off aspirin since her last visit on Jan 17, 2001.  Her symptoms are unchanged.  PHYSICAL EXAMINATION:  VITAL SIGNS:  Blood pressure 143/85, heart rate 116, respiratory rate 18, O2 saturation 97%, pain left 8/10, temperature 97.3.  NEUROLOGIC: Grossly unchanged.  DESCRIPTION OF PROCEDURE:  After informed consent was obtained, the patient was placed in the prone position with two pillows under her abdomen on the fluoroscopic table.  Using fluoroscopic guidance, I identified lumbar vertebrae L1 through L5.  At 3.5 cm lateral to the spinous process of L5, a mark was made on the skin chest caudad along the transverse process of L5 to the left.  The skin was marked and prepped with Betadine x 3.  I draped out the area.  Using a 25-gauge needle, I anesthetized the skin and subcutaneous structures.  Using a long Chiba needle, I placed the needle in the foramen of L5-S1 on the left side.  A paresthesia was elicited.  Aspiration was negative. I injected 1 cc of 1% lidocaine.  There was no evidence of a spinal; 3 cc of 1% lidocaine mixed with 40 mg was Medrol was gently injected.  The needle was flushed with preservative free normal saline and removed intact.  POSTPROCEDURE CONDITION:  Stable.  The patient demonstrated a very good L5 radicular pattern of loss of touch sense and pinprick.   Good L5 block.  She noted near total  resolution in her discomfort with this.  DISCHARGE INSTRUCTIONS: 1. Resume previous diet. 2. Limitation of activities per instruction sheet as outlined by my    assistant today.  Her friend drove her here and will drive her home. 3. Continue on current medications. 4. The patient plans to follow up with Dr. Eddie Dibbles.  She does appear to have    an L5 radicular pattern of discomfort which has improved with the    selective nerve root block on the left side at L5. DD:  02/04/01 TD:  02/04/01 Job: [id number]

## 2011-01-08 ENCOUNTER — Inpatient Hospital Stay (HOSPITAL_COMMUNITY)
Admission: EM | Admit: 2011-01-08 | Discharge: 2011-01-13 | DRG: 292 | Disposition: A | Discharge status: Home / Self Care | Payer: Medicare Other | Attending: Cardiology | Admitting: Cardiology

## 2011-01-08 ENCOUNTER — Emergency Department (HOSPITAL_COMMUNITY): Payer: Medicare Other

## 2011-01-08 DIAGNOSIS — Z882 Allergy status to sulfonamides status: Secondary | ICD-10-CM

## 2011-01-08 DIAGNOSIS — Z79899 Other long term (current) drug therapy: Secondary | ICD-10-CM

## 2011-01-08 DIAGNOSIS — Z7901 Long term (current) use of anticoagulants: Secondary | ICD-10-CM

## 2011-01-08 DIAGNOSIS — I10 Essential (primary) hypertension: Secondary | ICD-10-CM | POA: Diagnosis present

## 2011-01-08 DIAGNOSIS — I2510 Atherosclerotic heart disease of native coronary artery without angina pectoris: Secondary | ICD-10-CM | POA: Diagnosis present

## 2011-01-08 DIAGNOSIS — E662 Morbid (severe) obesity with alveolar hypoventilation: Secondary | ICD-10-CM | POA: Diagnosis present

## 2011-01-08 DIAGNOSIS — E119 Type 2 diabetes mellitus without complications: Secondary | ICD-10-CM | POA: Diagnosis present

## 2011-01-08 DIAGNOSIS — G4733 Obstructive sleep apnea (adult) (pediatric): Secondary | ICD-10-CM | POA: Diagnosis present

## 2011-01-08 DIAGNOSIS — R0789 Other chest pain: Secondary | ICD-10-CM | POA: Diagnosis present

## 2011-01-08 DIAGNOSIS — Z794 Long term (current) use of insulin: Secondary | ICD-10-CM

## 2011-01-08 DIAGNOSIS — I4891 Unspecified atrial fibrillation: Secondary | ICD-10-CM | POA: Diagnosis present

## 2011-01-08 DIAGNOSIS — J45909 Unspecified asthma, uncomplicated: Secondary | ICD-10-CM | POA: Diagnosis present

## 2011-01-08 DIAGNOSIS — I5023 Acute on chronic systolic (congestive) heart failure: Principal | ICD-10-CM | POA: Diagnosis present

## 2011-01-08 DIAGNOSIS — I509 Heart failure, unspecified: Secondary | ICD-10-CM | POA: Diagnosis present

## 2011-01-08 DIAGNOSIS — K219 Gastro-esophageal reflux disease without esophagitis: Secondary | ICD-10-CM | POA: Diagnosis present

## 2011-01-08 DIAGNOSIS — N289 Disorder of kidney and ureter, unspecified: Secondary | ICD-10-CM | POA: Diagnosis present

## 2011-01-08 LAB — GLUCOSE, CAPILLARY
Glucose-Capillary: 89 mg/dL (ref 70–99)
Glucose-Capillary: 74 mg/dL (ref 70–99)
Glucose-Capillary: 100 mg/dL — ABNORMAL HIGH (ref 70–99)
Glucose-Capillary: 97 mg/dL (ref 70–99)

## 2011-01-08 LAB — BASIC METABOLIC PANEL
BUN: 11 mg/dL (ref 6–23)
CO2: 22 mEq/L (ref 19–32)
Calcium: 9.4 mg/dL (ref 8.4–10.5)
Chloride: 107 mEq/L (ref 96–112)
Creatinine, Ser: 1.43 mg/dL — ABNORMAL HIGH (ref 0.4–1.2)
GFR calc Af Amer: 46 mL/min — ABNORMAL LOW (ref >60)
GFR calc non Af Amer: 38 mL/min — ABNORMAL LOW (ref >60)
Glucose, Bld: 89 mg/dL (ref 70–99)
Potassium: 4 mEq/L (ref 3.5–5.1)
Sodium: 141 mEq/L (ref 135–145)

## 2011-01-08 LAB — CARDIAC PANEL(CRET KIN+CKTOT+MB+TROPI)
CK, MB: 1.1 ng/mL (ref 0.3–4.0)
Relative Index: INVALID (ref 0.0–2.5)
Total CK: 83 U/L (ref 7–177)
Troponin I: <0.3 ng/mL (ref <0.30)

## 2011-01-08 LAB — URINALYSIS, ROUTINE W REFLEX MICROSCOPIC
Glucose, UA: NEGATIVE mg/dL
Hgb urine dipstick: NEGATIVE
Ketones, ur: NEGATIVE mg/dL
Nitrite: NEGATIVE
Protein, ur: NEGATIVE mg/dL
Specific Gravity, Urine: 1.021 (ref 1.005–1.030)
Urobilinogen, UA: 1 mg/dL (ref 0.0–1.0)
pH: 5.5 (ref 5.0–8.0)

## 2011-01-08 LAB — CBC
HCT: 40.9 % (ref 36.0–46.0)
Hemoglobin: 13.6 g/dL (ref 12.0–15.0)
MCH: 29.4 pg (ref 26.0–34.0)
MCHC: 33.3 g/dL (ref 30.0–36.0)
MCV: 88.3 fL (ref 78.0–100.0)
Platelets: 212 10*3/uL (ref 150–400)
RBC: 4.63 MIL/uL (ref 3.87–5.11)
RDW: 14.2 % (ref 11.5–15.5)
WBC: 6.5 10*3/uL (ref 4.0–10.5)

## 2011-01-08 LAB — DIFFERENTIAL
Basophils Absolute: 0 10*3/uL (ref 0.0–0.1)
Basophils Relative: 0 % (ref 0–1)
Eosinophils Absolute: 0.2 10*3/uL (ref 0.0–0.7)
Eosinophils Relative: 3 % (ref 0–5)
Lymphocytes Relative: 27 % (ref 12–46)
Lymphs Abs: 1.8 10*3/uL (ref 0.7–4.0)
Monocytes Absolute: 0.6 10*3/uL (ref 0.1–1.0)
Monocytes Relative: 10 % (ref 3–12)
Neutro Abs: 3.9 10*3/uL (ref 1.7–7.7)
Neutrophils Relative %: 60 % (ref 43–77)

## 2011-01-08 LAB — COMPREHENSIVE METABOLIC PANEL
ALT: 12 U/L (ref 0–35)
AST: 12 U/L (ref 0–37)
Albumin: 3.2 g/dL — ABNORMAL LOW (ref 3.5–5.2)
Alkaline Phosphatase: 118 U/L — ABNORMAL HIGH (ref 39–117)
BUN: 11 mg/dL (ref 6–23)
CO2: 24 mEq/L (ref 19–32)
Calcium: 9.6 mg/dL (ref 8.4–10.5)
Chloride: 106 mEq/L (ref 96–112)
Creatinine, Ser: 1.31 mg/dL — ABNORMAL HIGH (ref 0.4–1.2)
GFR calc Af Amer: 51 mL/min — ABNORMAL LOW (ref >60)
GFR calc non Af Amer: 42 mL/min — ABNORMAL LOW (ref >60)
Glucose, Bld: 81 mg/dL (ref 70–99)
Potassium: 4.5 mEq/L (ref 3.5–5.1)
Sodium: 141 mEq/L (ref 135–145)
Total Bilirubin: 0.4 mg/dL (ref 0.3–1.2)
Total Protein: 7.8 g/dL (ref 6.0–8.3)

## 2011-01-08 LAB — CK TOTAL AND CKMB (NOT AT ARMC)
CK, MB: 1.1 ng/mL (ref 0.3–4.0)
Relative Index: INVALID (ref 0.0–2.5)
Total CK: 83 U/L (ref 7–177)

## 2011-01-08 LAB — POCT CARDIAC MARKERS
CKMB, poc: <1 ng/mL — ABNORMAL LOW (ref 1.0–8.0)
Myoglobin, poc: 119 ng/mL (ref 12–200)
Troponin i, poc: <0.05 ng/mL (ref 0.00–0.09)

## 2011-01-08 LAB — PROTIME-INR
INR: 2.2 — ABNORMAL HIGH (ref 0.00–1.49)
Prothrombin Time: 24.6 seconds — ABNORMAL HIGH (ref 11.6–15.2)

## 2011-01-08 LAB — TSH: TSH: 1.615 u[IU]/mL (ref 0.350–4.500)

## 2011-01-08 LAB — D-DIMER, QUANTITATIVE: D-Dimer, Quant: 0.34 ug/mL-FEU (ref 0.00–0.48)

## 2011-01-08 LAB — PRO B NATRIURETIC PEPTIDE: Pro B Natriuretic peptide (BNP): 829.1 pg/mL — ABNORMAL HIGH (ref 0–125)

## 2011-01-08 LAB — TROPONIN I: Troponin I: <0.3 ng/mL (ref <0.30)

## 2011-01-08 LAB — MAGNESIUM: Magnesium: 2 mg/dL (ref 1.5–2.5)

## 2011-01-09 LAB — CARDIAC PANEL(CRET KIN+CKTOT+MB+TROPI)
CK, MB: 1.6 ng/mL (ref 0.3–4.0)
Relative Index: 1.1 (ref 0.0–2.5)
Total CK: 149 U/L (ref 7–177)
Troponin I: <0.3 ng/mL (ref <0.30)

## 2011-01-09 LAB — GLUCOSE, CAPILLARY
Glucose-Capillary: 76 mg/dL (ref 70–99)
Glucose-Capillary: 107 mg/dL — ABNORMAL HIGH (ref 70–99)
Glucose-Capillary: 90 mg/dL (ref 70–99)
Glucose-Capillary: 74 mg/dL (ref 70–99)

## 2011-01-09 LAB — CBC
HCT: 48.5 % — ABNORMAL HIGH (ref 36.0–46.0)
Hemoglobin: 16.2 g/dL — ABNORMAL HIGH (ref 12.0–15.0)
MCH: 29.5 pg (ref 26.0–34.0)
MCHC: 33.4 g/dL (ref 30.0–36.0)
MCV: 88.2 fL (ref 78.0–100.0)
Platelets: 193 10*3/uL (ref 150–400)
RBC: 5.5 MIL/uL — ABNORMAL HIGH (ref 3.87–5.11)
RDW: 14 % (ref 11.5–15.5)
WBC: 10.1 10*3/uL (ref 4.0–10.5)

## 2011-01-09 LAB — BASIC METABOLIC PANEL
BUN: 10 mg/dL (ref 6–23)
CO2: 24 mEq/L (ref 19–32)
Calcium: 9.7 mg/dL (ref 8.4–10.5)
Chloride: 103 mEq/L (ref 96–112)
Creatinine, Ser: 1.3 mg/dL — ABNORMAL HIGH (ref 0.4–1.2)
GFR calc Af Amer: 52 mL/min — ABNORMAL LOW (ref >60)
GFR calc non Af Amer: 43 mL/min — ABNORMAL LOW (ref >60)
Glucose, Bld: 90 mg/dL (ref 70–99)
Potassium: 4.6 mEq/L (ref 3.5–5.1)
Sodium: 138 mEq/L (ref 135–145)

## 2011-01-09 LAB — HEMOGLOBIN A1C
Hgb A1c MFr Bld: 5.9 % — ABNORMAL HIGH (ref <5.7)
Mean Plasma Glucose: 123 mg/dL — ABNORMAL HIGH (ref <117)

## 2011-01-09 LAB — PROTIME-INR
INR: 2.11 — ABNORMAL HIGH (ref 0.00–1.49)
Prothrombin Time: 23.8 seconds — ABNORMAL HIGH (ref 11.6–15.2)

## 2011-01-09 LAB — PRO B NATRIURETIC PEPTIDE: Pro B Natriuretic peptide (BNP): 637.7 pg/mL — ABNORMAL HIGH (ref 0–125)

## 2011-01-10 LAB — PRO B NATRIURETIC PEPTIDE: Pro B Natriuretic peptide (BNP): 674.1 pg/mL — ABNORMAL HIGH (ref 0–125)

## 2011-01-10 LAB — GLUCOSE, CAPILLARY
Glucose-Capillary: 79 mg/dL (ref 70–99)
Glucose-Capillary: 102 mg/dL — ABNORMAL HIGH (ref 70–99)
Glucose-Capillary: 90 mg/dL (ref 70–99)
Glucose-Capillary: 102 mg/dL — ABNORMAL HIGH (ref 70–99)

## 2011-01-10 LAB — CBC
HCT: 39.1 % (ref 36.0–46.0)
Hemoglobin: 13 g/dL (ref 12.0–15.0)
MCH: 29 pg (ref 26.0–34.0)
MCHC: 33.2 g/dL (ref 30.0–36.0)
MCV: 87.3 fL (ref 78.0–100.0)
Platelets: 220 10*3/uL (ref 150–400)
RBC: 4.48 MIL/uL (ref 3.87–5.11)
RDW: 13.8 % (ref 11.5–15.5)
WBC: 6.9 10*3/uL (ref 4.0–10.5)

## 2011-01-10 LAB — BASIC METABOLIC PANEL
BUN: 14 mg/dL (ref 6–23)
CO2: 27 mEq/L (ref 19–32)
Calcium: 9.9 mg/dL (ref 8.4–10.5)
Chloride: 103 mEq/L (ref 96–112)
Creatinine, Ser: 1.36 mg/dL — ABNORMAL HIGH (ref 0.4–1.2)
GFR calc Af Amer: 49 mL/min — ABNORMAL LOW (ref >60)
GFR calc non Af Amer: 41 mL/min — ABNORMAL LOW (ref >60)
Glucose, Bld: 88 mg/dL (ref 70–99)
Potassium: 3.8 mEq/L (ref 3.5–5.1)
Sodium: 140 mEq/L (ref 135–145)

## 2011-01-10 LAB — PROTIME-INR
INR: 2.09 — ABNORMAL HIGH (ref 0.00–1.49)
Prothrombin Time: 23.6 seconds — ABNORMAL HIGH (ref 11.6–15.2)

## 2011-01-11 LAB — CBC
HCT: 41 % (ref 36.0–46.0)
Hemoglobin: 13.6 g/dL (ref 12.0–15.0)
MCH: 29.4 pg (ref 26.0–34.0)
MCHC: 33.2 g/dL (ref 30.0–36.0)
MCV: 88.7 fL (ref 78.0–100.0)
Platelets: 221 10*3/uL (ref 150–400)
RBC: 4.62 MIL/uL (ref 3.87–5.11)
RDW: 13.7 % (ref 11.5–15.5)
WBC: 7 10*3/uL (ref 4.0–10.5)

## 2011-01-11 LAB — GLUCOSE, CAPILLARY
Glucose-Capillary: 98 mg/dL (ref 70–99)
Glucose-Capillary: 86 mg/dL (ref 70–99)
Glucose-Capillary: 105 mg/dL — ABNORMAL HIGH (ref 70–99)
Glucose-Capillary: 86 mg/dL (ref 70–99)
Glucose-Capillary: 116 mg/dL — ABNORMAL HIGH (ref 70–99)

## 2011-01-11 LAB — BASIC METABOLIC PANEL
BUN: 16 mg/dL (ref 6–23)
CO2: 29 mEq/L (ref 19–32)
Calcium: 9.8 mg/dL (ref 8.4–10.5)
Chloride: 100 mEq/L (ref 96–112)
Creatinine, Ser: 1.48 mg/dL — ABNORMAL HIGH (ref 0.4–1.2)
GFR calc Af Amer: 44 mL/min — ABNORMAL LOW (ref >60)
GFR calc non Af Amer: 37 mL/min — ABNORMAL LOW (ref >60)
Glucose, Bld: 126 mg/dL — ABNORMAL HIGH (ref 70–99)
Potassium: 3.9 mEq/L (ref 3.5–5.1)
Sodium: 139 mEq/L (ref 135–145)

## 2011-01-11 LAB — PRO B NATRIURETIC PEPTIDE: Pro B Natriuretic peptide (BNP): 465.9 pg/mL — ABNORMAL HIGH (ref 0–125)

## 2011-01-11 LAB — PROTIME-INR
INR: 2.03 — ABNORMAL HIGH (ref 0.00–1.49)
Prothrombin Time: 23.1 seconds — ABNORMAL HIGH (ref 11.6–15.2)

## 2011-01-12 LAB — GLUCOSE, CAPILLARY
Glucose-Capillary: 91 mg/dL (ref 70–99)
Glucose-Capillary: 102 mg/dL — ABNORMAL HIGH (ref 70–99)
Glucose-Capillary: 98 mg/dL (ref 70–99)
Glucose-Capillary: 91 mg/dL (ref 70–99)

## 2011-01-12 LAB — PROTIME-INR
INR: 2.06 — ABNORMAL HIGH (ref 0.00–1.49)
Prothrombin Time: 23.4 seconds — ABNORMAL HIGH (ref 11.6–15.2)

## 2011-01-12 LAB — BASIC METABOLIC PANEL
BUN: 15 mg/dL (ref 6–23)
CO2: 32 mEq/L (ref 19–32)
Calcium: 9.8 mg/dL (ref 8.4–10.5)
Chloride: 98 mEq/L (ref 96–112)
Creatinine, Ser: 1.53 mg/dL — ABNORMAL HIGH (ref 0.4–1.2)
GFR calc Af Amer: 43 mL/min — ABNORMAL LOW (ref >60)
GFR calc non Af Amer: 35 mL/min — ABNORMAL LOW (ref >60)
Glucose, Bld: 114 mg/dL — ABNORMAL HIGH (ref 70–99)
Potassium: 4.1 mEq/L (ref 3.5–5.1)
Sodium: 137 mEq/L (ref 135–145)

## 2011-01-12 LAB — DIGOXIN LEVEL: Digoxin Level: 0.7 ng/mL — ABNORMAL LOW (ref 0.8–2.0)

## 2011-01-12 LAB — CARDIAC PANEL(CRET KIN+CKTOT+MB+TROPI)
CK, MB: 1.5 ng/mL (ref 0.3–4.0)
Relative Index: 1 (ref 0.0–2.5)
Total CK: 153 U/L (ref 7–177)
Troponin I: <0.3 ng/mL (ref <0.30)

## 2011-01-13 ENCOUNTER — Inpatient Hospital Stay (HOSPITAL_COMMUNITY): Payer: Medicare Other

## 2011-01-13 LAB — BASIC METABOLIC PANEL
BUN: 16 mg/dL (ref 6–23)
CO2: 31 mEq/L (ref 19–32)
Calcium: 9.7 mg/dL (ref 8.4–10.5)
Chloride: 98 mEq/L (ref 96–112)
Creatinine, Ser: 1.44 mg/dL — ABNORMAL HIGH (ref 0.4–1.2)
GFR calc Af Amer: 46 mL/min — ABNORMAL LOW (ref >60)
GFR calc non Af Amer: 38 mL/min — ABNORMAL LOW (ref >60)
Glucose, Bld: 144 mg/dL — ABNORMAL HIGH (ref 70–99)
Potassium: 4.9 mEq/L (ref 3.5–5.1)
Sodium: 135 mEq/L (ref 135–145)

## 2011-01-13 LAB — CBC
HCT: 41 % (ref 36.0–46.0)
Hemoglobin: 13.7 g/dL (ref 12.0–15.0)
MCH: 29.8 pg (ref 26.0–34.0)
MCHC: 33.4 g/dL (ref 30.0–36.0)
MCV: 89.3 fL (ref 78.0–100.0)
Platelets: 227 10*3/uL (ref 150–400)
RBC: 4.59 MIL/uL (ref 3.87–5.11)
RDW: 13.5 % (ref 11.5–15.5)
WBC: 6.7 10*3/uL (ref 4.0–10.5)

## 2011-01-13 LAB — PRO B NATRIURETIC PEPTIDE: Pro B Natriuretic peptide (BNP): 230.6 pg/mL — ABNORMAL HIGH (ref 0–125)

## 2011-01-13 LAB — GLUCOSE, CAPILLARY
Glucose-Capillary: 73 mg/dL (ref 70–99)
Glucose-Capillary: 117 mg/dL — ABNORMAL HIGH (ref 70–99)

## 2011-01-13 LAB — CARDIAC PANEL(CRET KIN+CKTOT+MB+TROPI)
CK, MB: 1.1 ng/mL (ref 0.3–4.0)
Relative Index: INVALID (ref 0.0–2.5)
Total CK: 93 U/L (ref 7–177)
Troponin I: <0.3 ng/mL (ref <0.30)

## 2011-01-13 LAB — PROTIME-INR
INR: 2.18 — ABNORMAL HIGH (ref 0.00–1.49)
Prothrombin Time: 24.4 seconds — ABNORMAL HIGH (ref 11.6–15.2)

## 2011-02-09 NOTE — Discharge Summary (Signed)
[name]        ACCOUNT NO.:  [id number]  MEDICAL RECORD NO.:  [id number]           PATIENT TYPE:  I  LOCATION:  [id number]                         FACILITY:  [facility]  PHYSICIAN:  Madelyne Millikan N. Terrence Dupont, M.D. DATE OF BIRTH:  May 02, 1956  DATE OF ADMISSION:  01/08/2011 DATE OF DISCHARGE:  01/13/2011                              DISCHARGE SUMMARY   ADMITTING DIAGNOSES: 1. Congestive heart failure rule out myocardial infarction. 2. Morbid obesity. 3. Bilateral leg edema.  DISCHARGE DIAGNOSES: 1. Compensated systolic heart failure. 2. Chronic atrial fibrillation. 3. Hypertension. 4. Insulin-requiring diabetes mellitus. 5. Morbid obesity. 6. Obstructive sleep apnea/obesity hypoventilation syndrome. 7. Mild coronary artery disease status post atypical chest pain. 8. Mild renal insufficiency.  DISCHARGE MEDICATIONS: 1. Carvedilol 12.5 mg 1 tablet twice daily with meals. 2. Digoxin 0.25 mg 1 tablet daily. 3. Losartan 50 mg 1 tablet daily. 4. Albuterol inhaler 2 puffs every 6 hours as needed. 5. Crestor 10 mg 1 tablet daily. 6. Clorazepate 15 mg 1 tablet twice daily as before. 7. Lasix 40 mg 1 tablet every morning. 8. Lantus insulin 20 units every morning as before. 9. Patanol eye drops twice daily as before. 10.Potassium chloride 1 tablet 20 mEq twice daily as before. 11.Aldactone 25 mg 1 tablet daily. 12.Warfarin 1 mg one and a half tablet daily.  DIET:  Low-salt, low-cholesterol, 1800-calories ADA diet.  The patient has been advised to monitor weight, and the patient has been advised to monitor blood sugar and blood pressure daily and also been advised to reduce weight.  Special instructions have been given for heart failure. The patient has been advised to monitor weights daily.  If she gains more than 3-4 pounds in 1 day or 2 pounds overnight or experiences difficulty in breathing or swelling in the legs, hands, or stomach or is using extra pillows at night because of  problem breathing, should call my office immediately or call 911.  CONDITION AT DISCHARGE:  Stable.  BRIEF HISTORY AND HOSPITAL COURSE:  [name] is a 55 year old black female with past medical history significant for multiple medical problems, i.e., insulin-requiring diabetes mellitus, hypertension, history of congestive heart failure, chronic atrial fibrillation, morbid obesity, was admitted by Dr. Doylene Canard because of bilateral leg swelling, chest pain associated with shortness of breath.  The patient denies any cough or fever.  PAST MEDICAL HISTORY:  As above.  PAST SURGICAL HISTORY:  She had right carpal tunnel surgery in the past.  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  ALLERGIES:  She is allergic to [place].  MEDICATION AT HOME:  She was on clorazepate, Crestor, Lasix, Reglan, lorazepam, potassium chloride, spironolactone, and Coumadin.  PHYSICAL EXAMINATION:  GENERAL:  She was alert, awake, oriented x3. VITAL SIGNS:  Blood pressure was 105/68, pulse was 83. EYES:  Conjunctivae was pink. NECK:  Supple.  No JVD. LUNGS:  Clear anterolaterally. HEART:  Sounds S1 and S2 were normal. ABDOMEN:  Soft. EXTREMITIES:  There is no clubbing, cyanosis, or edema. NEUROLOGIC:  Grossly intact.  ADMISSION LABORATORY DATA:  Hemoglobin was 13.6,  hematocrit 40.9, white count of 6.5.  Her D-dimer was 0.34 which was in normal range.  BUN was 11, creatinine 1.43, potassium 4.0.  Cardiac enzymes two sets were negative.  Her PT and INR was 24.6 with INR of 2.20.  Pro-BNP was 829 which is gradually trending down on Jan 11, 2011.  Pro-BNP was 465. Today, her pro-BNP is 230.  Her INR today is 2.18.  Sodium is 135, potassium 4.9, BUN 16, creatinine 1.44, glucose 144.  Her hemoglobin is 13.7, hematocrit 41, white count of 6.7.  Her admission weight was 162 kg.  Today's  weight is 157.39 kg.  The patient lost approximately 11 pounds during the hospital stay.  BRIEF HOSPITAL COURSE:  The patient was admitted to telemetry unit.  MI was ruled out by serial enzymes and EKG.  The patient was started on her home medications and Lasix dose was increased.  Dig and Coreg were also added as the patient had episodes of AFib with RVR.  The patient had good diuresis with loss of approximately 11 pounds during the hospital stay.  The patient's pro-BNP has gradually come down.  Her chest x-ray showed no evidence of acute heart failure.  The patient will be discharged home on above medications and will be followed up in my office in 1 week.     Allegra Lai. Terrence Dupont, M.D.    M[facility]/MEDQ  D:  01/13/2011  T:  01/14/2011  Job:  [id number]  Electronically Signed by Charolette Forward M.D. on 02/09/2011 09:37:45 AM

## 2011-05-28 ENCOUNTER — Emergency Department (HOSPITAL_COMMUNITY)
Admission: EM | Admit: 2011-05-28 | Discharge: 2011-05-28 | Disposition: A | Discharge status: Home / Self Care | Payer: Medicare Other | Attending: Emergency Medicine | Admitting: Emergency Medicine

## 2011-05-28 DIAGNOSIS — I4891 Unspecified atrial fibrillation: Secondary | ICD-10-CM | POA: Insufficient documentation

## 2011-05-28 DIAGNOSIS — Z794 Long term (current) use of insulin: Secondary | ICD-10-CM | POA: Insufficient documentation

## 2011-05-28 DIAGNOSIS — I2510 Atherosclerotic heart disease of native coronary artery without angina pectoris: Secondary | ICD-10-CM | POA: Insufficient documentation

## 2011-05-28 DIAGNOSIS — Z7901 Long term (current) use of anticoagulants: Secondary | ICD-10-CM | POA: Insufficient documentation

## 2011-05-28 DIAGNOSIS — Z79899 Other long term (current) drug therapy: Secondary | ICD-10-CM | POA: Insufficient documentation

## 2011-05-28 DIAGNOSIS — I10 Essential (primary) hypertension: Secondary | ICD-10-CM | POA: Insufficient documentation

## 2011-05-28 DIAGNOSIS — M7989 Other specified soft tissue disorders: Secondary | ICD-10-CM | POA: Insufficient documentation

## 2011-05-28 DIAGNOSIS — K219 Gastro-esophageal reflux disease without esophagitis: Secondary | ICD-10-CM | POA: Insufficient documentation

## 2011-05-28 DIAGNOSIS — M79609 Pain in unspecified limb: Secondary | ICD-10-CM | POA: Insufficient documentation

## 2011-05-28 DIAGNOSIS — M109 Gout, unspecified: Secondary | ICD-10-CM | POA: Insufficient documentation

## 2011-05-28 DIAGNOSIS — M129 Arthropathy, unspecified: Secondary | ICD-10-CM | POA: Insufficient documentation

## 2011-05-28 DIAGNOSIS — E119 Type 2 diabetes mellitus without complications: Secondary | ICD-10-CM | POA: Insufficient documentation

## 2011-05-28 LAB — GLUCOSE, CAPILLARY: Glucose-Capillary: 133 mg/dL — ABNORMAL HIGH (ref 70–99)

## 2011-06-08 LAB — D-DIMER, QUANTITATIVE: D-Dimer, Quant: 2.68 — ABNORMAL HIGH

## 2011-08-23 ENCOUNTER — Other Ambulatory Visit: Payer: Self-pay | Admitting: Cardiology

## 2011-09-21 ENCOUNTER — Encounter: Payer: Self-pay | Admitting: Pulmonary Disease

## 2011-09-21 ENCOUNTER — Ambulatory Visit (INDEPENDENT_AMBULATORY_CARE_PROVIDER_SITE_OTHER): Payer: Medicare Other | Admitting: Pulmonary Disease

## 2011-09-21 VITALS — BP 112/60 | HR 126 | Temp 97.4°F | Ht 65.5 in | Wt 385.2 lb

## 2011-09-21 DIAGNOSIS — G4733 Obstructive sleep apnea (adult) (pediatric): Secondary | ICD-10-CM

## 2011-09-21 DIAGNOSIS — R0902 Hypoxemia: Secondary | ICD-10-CM

## 2011-09-21 NOTE — Progress Notes (Signed)
  Subjective:    Patient ID: [name], female    DOB: Jan 17, 1956, 56 y.o.   MRN: [id number]  HPI  PCP - [place]  56 year old morbidly obese diabetic with congestive heart failure presents for evaluation of hypoxia and obstructive sleep apnea. 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 I note an admission in May 2012 for congestive heart failure. Echo then estimated ejection fraction 40-45% with diffuse hypokinesis. She has what seems like an oxygen concentrator at home. She uses oxygen during sleep and wakes up congested. She complains of increasing dyspnea for the past 3 months now to grade 3. Oximetry evaluation by DME showed a saturation of 97% at room air. She walked with a cane for approximately 40 yards with desat to 72% and improved to 98% on 2 L of oxygen after 5 minutes of recovery. However she did not desaturate after walking 1-2 laps in the office today.  Epworth sleepiness score is 14 /24. Bedtime is 9 to 10 PM, sleep latency is 30 minutes, she sleeps on her side with 2 pillows. Therefore to 5 nocturnal awakenings with nocturia, she gets out of bed at 8 AM feeling tired with dryness of mouth and occasional headache. She has gained 35 pounds since her sleep study in September 08. PSG when she weighed 350 pounds showed an AHI of 22 events per hour, predominant hypopneas. Total sleep time was 123 minutes. Nadir desaturation was 85%. For some reason she was not placed on CPAP therapy There is no history suggestive of cataplexy, sleep paralysis or parasomnias     Review of Systems  Constitutional: Negative for fever and unexpected weight change.  HENT: Negative for ear pain, nosebleeds, congestion, sore throat, rhinorrhea, sneezing, trouble swallowing, dental problem, postnasal drip and sinus pressure.   Eyes: Negative for redness and itching.  Respiratory: Positive for shortness of breath. Negative for cough, chest tightness and wheezing.     Cardiovascular: Positive for chest pain. Negative for palpitations and leg swelling.  Gastrointestinal: Positive for abdominal pain. Negative for nausea and vomiting.  Genitourinary: Negative for dysuria.  Musculoskeletal: Positive for arthralgias. Negative for joint swelling.  Skin: Negative for rash.  Neurological: Negative for headaches.  Hematological: Does not bruise/bleed easily.  Psychiatric/Behavioral: Positive for dysphoric mood. The patient is nervous/anxious.        Objective:   Physical Exam  Gen. Pleasant, obese, in no distress, normal affect ENT - no lesions, no post nasal drip, class 2-3 airway Neck: No JVD, no thyromegaly, no carotid bruits Lungs: no use of accessory muscles, no dullness to percussion, decreased without rales or rhonchi  Cardiovascular: Rhythm regular, heart sounds  normal, no murmurs or gallops, no peripheral edema Abdomen: soft and non-tender, no hepatosplenomegaly, BS normal. Musculoskeletal: No deformities, no cyanosis or clubbing Neuro:  alert, non focal, no tremors       Assessment & Plan:

## 2011-09-21 NOTE — Patient Instructions (Addendum)
You have obstructive sleep apnea  We will start oxygen during sleep We will set you up with  aCPAP machine & plan for a sleep study

## 2011-09-21 NOTE — Assessment & Plan Note (Signed)
Altered DME evaluation does qualify her for oxygen based on the saturation while walking, this could not be reproduced today. She has an oxygen concentrator which she will continue to use. We will reassess oximetry on ambulation at next visit.

## 2011-09-21 NOTE — Assessment & Plan Note (Signed)
Moderate degree Set up auto CPAP with nasal pillows & arrange for CPAP titration study since she has gained 35 lbs  Weight loss encouraged, compliance with goal of at least 4-6 hrs every night is the expectation. Advised against medications with sedative side effects Cautioned against driving when sleepy - understanding that sleepiness will vary on a day to day basis

## 2011-10-02 ENCOUNTER — Other Ambulatory Visit: Payer: Self-pay | Admitting: Cardiology

## 2011-10-08 ENCOUNTER — Ambulatory Visit (HOSPITAL_BASED_OUTPATIENT_CLINIC_OR_DEPARTMENT_OTHER): Payer: Medicare Other | Attending: Pulmonary Disease

## 2011-10-08 VITALS — Ht 65.0 in | Wt 378.0 lb

## 2011-10-08 DIAGNOSIS — I4891 Unspecified atrial fibrillation: Secondary | ICD-10-CM | POA: Insufficient documentation

## 2011-10-08 DIAGNOSIS — Z7901 Long term (current) use of anticoagulants: Secondary | ICD-10-CM | POA: Insufficient documentation

## 2011-10-08 DIAGNOSIS — Z9989 Dependence on other enabling machines and devices: Secondary | ICD-10-CM

## 2011-10-08 DIAGNOSIS — Z6841 Body Mass Index (BMI) 40.0 and over, adult: Secondary | ICD-10-CM | POA: Insufficient documentation

## 2011-10-08 DIAGNOSIS — E119 Type 2 diabetes mellitus without complications: Secondary | ICD-10-CM | POA: Insufficient documentation

## 2011-10-08 DIAGNOSIS — I10 Essential (primary) hypertension: Secondary | ICD-10-CM | POA: Insufficient documentation

## 2011-10-08 DIAGNOSIS — G4733 Obstructive sleep apnea (adult) (pediatric): Secondary | ICD-10-CM

## 2011-10-08 DIAGNOSIS — Z79899 Other long term (current) drug therapy: Secondary | ICD-10-CM | POA: Insufficient documentation

## 2011-10-10 DIAGNOSIS — I4891 Unspecified atrial fibrillation: Secondary | ICD-10-CM

## 2011-10-10 DIAGNOSIS — G4733 Obstructive sleep apnea (adult) (pediatric): Secondary | ICD-10-CM

## 2011-10-10 NOTE — Procedures (Signed)
NAMENATASH, FARLESS        ACCOUNT NO.:  [id number]  MEDICAL RECORD NO.:  [id number]          PATIENT TYPE:  OUT  LOCATION:  SLEEP CENTER                 [facility]ACILITY:  [facility]  PHYSICIAN:  Rigoberto Noel, MD      DATE OF BIRTH:  [birth year]  DATE OF STUDY:  10/08/2011                           NOCTURNAL POLYSOMNOGRAM  REFERRING PHYSICIAN:  Rigoberto Noel, MD  INDICATION FOR STUDY:  Ms. Tyan is a morbidly obese woman with diabetes, hypertension, atrial fibrillation, and obstructive sleep apnea.  The baseline study is not available at the time of this dictation.  This study was performed as a CPAP titration study.  At the time of the study, she weighed 378 pounds with a height of 65 inches, BMI of 63.  Neck size of 17.5 inches.  EPWORTH SLEEPINESS SCORE:  Sixteen.  MEDICATIONS: 1. Patanol. 2. Warfarin. 3. Furosemide. 4. Potassium. 5. Lantus. 6. Losartan. 7. Omeprazole. 8. Spironolactone. 9. Clorazepate. 10.Januvia. 11.Gabapentin. 12.Crestor.  CPAP titration study was performed with a sleep technologist in attendance, EEG, EOG, EMG, EKG, and respiratory parameters were recorded.  Sleep stages arousals, limb movements, and respiratory data were scored according to criteria laid out by the American Academy of Sleep Medicine.  SLEEP ARCHITECTURE:  Lights out was at 10:13 p.m., lights on was at 4:49 a.m.  Total sleep time was 349 minutes with sleep period time of 359 minutes.  Sleep efficiency of 88%.  Sleep latency was 35 minutes and latency to REM sleep was 82 minutes.  REM sleep was noted in 3 stages, the longest around 4 a.m.  Sleep stages of the percentage of total sleep time was N1 10%, N2 68%, N3 0%, and REM sleep 22% (76 minutes).  Supine REM sleep accounted for 76 minutes.  Arousal Data:  There were 24 arousals with an arousal index of 40 events per hour.  Most of these were spontaneous.  RESPIRATORY DATA:  CPAP was initiated with a small full-face mask at  5 cm and titrated to a final level of 9 cm due to snoring and respiratory events.  She slept supine for the entire study.  At the final level of 9 cm for 29 minutes, including 26 minutes REM sleep, 1 obstructive apnea was noted with a lowest desaturation of 92%.  This appears to be the optimal level used during the study.  At a level of 8 cm for 196 minutes including 34 minutes of REM sleep, 1 obstructive apnea, and 1 hypopnea was noted with a lowest desaturation of 89%.  OXYGEN DATA:  The desaturation index was 5 events per hour.  The lowest desaturation of 88%.  She spent 0 minutes of saturation less than 88%.  CARDIAC DATA:  The low heart rate was 42 beats per minute.  The high heart rate recorded was an artifact.  No arrhythmias were noted.  MOVEMENT-PARASOMNIA:  No significant limb movements were noted.  Discussion:  She was desensitized with a small Respironics full-face mask, reflex setting of left 3 cm was used.  No supplemental oxygen was needed during the study.  IMPRESSIONS-RECOMMENDATIONS: 1. Moderate obstructive sleep apnea with hypopneas causing sleep     fragmentation, oxygen desaturation.  These were corrected by  CPAP     of 9 cm with a small full-face mask.  Titration appears to be     optimal. 2. No evidence of limb movements or behavioral disturbance during sleep. 3. Atrial fibrillation was noted.  Recommendation: 1. CPAP can be adjusted to 9 cm with a small full-face mask and heated     humidity.  No supplemental oxygen is indicated. 2. Weight loss should be recommended. 3. She should be cautioned against driving when sleepy.  She should be     asked to avoid medications with sedative side effects.     Rigoberto Noel, MD    RVA/MEDQ  D:  10/10/2011 08:30:03  T:  10/10/2011 08:51:41  Job:  [id number]

## 2011-10-19 ENCOUNTER — Ambulatory Visit: Payer: Medicare Other | Admitting: Pulmonary Disease

## 2011-11-27 ENCOUNTER — Telehealth: Payer: Self-pay | Admitting: Pulmonary Disease

## 2011-11-27 DIAGNOSIS — G4733 Obstructive sleep apnea (adult) (pediatric): Secondary | ICD-10-CM

## 2011-11-27 NOTE — Telephone Encounter (Signed)
Order placed. Pt is aware.  [place], CMA

## 2011-11-27 NOTE — Telephone Encounter (Signed)
Spoke with patient-states she uses "nasal mask" right now through [place] is having more congestion and would like to have order for new CPAP mask of choice. Please advise if okay to give order to [facility]. Thanks.

## 2011-11-27 NOTE — Telephone Encounter (Signed)
ok 

## 2011-11-29 ENCOUNTER — Ambulatory Visit: Payer: Medicare Other | Admitting: Pulmonary Disease

## 2011-11-30 ENCOUNTER — Ambulatory Visit: Payer: Medicare Other | Admitting: Pulmonary Disease

## 2012-01-04 ENCOUNTER — Ambulatory Visit: Payer: Medicare Other | Admitting: Pulmonary Disease

## 2012-02-26 ENCOUNTER — Emergency Department (HOSPITAL_COMMUNITY): Payer: Medicare Other

## 2012-02-26 ENCOUNTER — Encounter (HOSPITAL_COMMUNITY): Payer: Self-pay | Admitting: Emergency Medicine

## 2012-02-26 ENCOUNTER — Inpatient Hospital Stay (HOSPITAL_COMMUNITY)
Admission: EM | Admit: 2012-02-26 | Discharge: 2012-02-29 | DRG: 313 | Disposition: A | Discharge status: Home / Self Care | Payer: Medicare Other | Attending: Cardiology | Admitting: Cardiology

## 2012-02-26 DIAGNOSIS — R079 Chest pain, unspecified: Secondary | ICD-10-CM

## 2012-02-26 DIAGNOSIS — R0789 Other chest pain: Principal | ICD-10-CM | POA: Diagnosis present

## 2012-02-26 DIAGNOSIS — E1165 Type 2 diabetes mellitus with hyperglycemia: Secondary | ICD-10-CM | POA: Diagnosis present

## 2012-02-26 DIAGNOSIS — I129 Hypertensive chronic kidney disease with stage 1 through stage 4 chronic kidney disease, or unspecified chronic kidney disease: Secondary | ICD-10-CM | POA: Diagnosis present

## 2012-02-26 DIAGNOSIS — Z794 Long term (current) use of insulin: Secondary | ICD-10-CM

## 2012-02-26 DIAGNOSIS — IMO0002 Reserved for concepts with insufficient information to code with codable children: Secondary | ICD-10-CM | POA: Diagnosis present

## 2012-02-26 DIAGNOSIS — Z6841 Body Mass Index (BMI) 40.0 and over, adult: Secondary | ICD-10-CM

## 2012-02-26 DIAGNOSIS — G4733 Obstructive sleep apnea (adult) (pediatric): Secondary | ICD-10-CM | POA: Diagnosis present

## 2012-02-26 DIAGNOSIS — N183 Chronic kidney disease, stage 3 unspecified: Secondary | ICD-10-CM | POA: Diagnosis present

## 2012-02-26 DIAGNOSIS — I5021 Acute systolic (congestive) heart failure: Secondary | ICD-10-CM | POA: Diagnosis present

## 2012-02-26 DIAGNOSIS — Z86718 Personal history of other venous thrombosis and embolism: Secondary | ICD-10-CM

## 2012-02-26 DIAGNOSIS — I509 Heart failure, unspecified: Secondary | ICD-10-CM | POA: Diagnosis present

## 2012-02-26 DIAGNOSIS — E7800 Pure hypercholesterolemia, unspecified: Secondary | ICD-10-CM | POA: Diagnosis present

## 2012-02-26 DIAGNOSIS — I4891 Unspecified atrial fibrillation: Secondary | ICD-10-CM | POA: Diagnosis present

## 2012-02-26 HISTORY — DX: Major depressive disorder, single episode, unspecified: F32.9

## 2012-02-26 HISTORY — DX: Depression, unspecified: F32.A

## 2012-02-26 HISTORY — DX: Chronic obstructive pulmonary disease, unspecified: J44.9

## 2012-02-26 HISTORY — DX: Headache: R51

## 2012-02-26 HISTORY — DX: Shortness of breath: R06.02

## 2012-02-26 HISTORY — DX: Gastro-esophageal reflux disease without esophagitis: K21.9

## 2012-02-26 HISTORY — DX: Unspecified osteoarthritis, unspecified site: M19.90

## 2012-02-26 LAB — CBC
HCT: 41.8 % (ref 36.0–46.0)
HCT: 40.4 % (ref 36.0–46.0)
Hemoglobin: 14.3 g/dL (ref 12.0–15.0)
Hemoglobin: 13.8 g/dL (ref 12.0–15.0)
MCH: 29.1 pg (ref 26.0–34.0)
MCH: 28.9 pg (ref 26.0–34.0)
MCHC: 34.2 g/dL (ref 30.0–36.0)
MCHC: 34.2 g/dL (ref 30.0–36.0)
MCV: 85 fL (ref 78.0–100.0)
MCV: 84.7 fL (ref 78.0–100.0)
Platelets: 214 10*3/uL (ref 150–400)
Platelets: 196 10*3/uL (ref 150–400)
RBC: 4.92 MIL/uL (ref 3.87–5.11)
RBC: 4.77 MIL/uL (ref 3.87–5.11)
RDW: 12.9 % (ref 11.5–15.5)
RDW: 12.9 % (ref 11.5–15.5)
WBC: 9.1 10*3/uL (ref 4.0–10.5)
WBC: 9.8 10*3/uL (ref 4.0–10.5)

## 2012-02-26 LAB — PROTIME-INR
INR: 2.79 — ABNORMAL HIGH (ref 0.00–1.49)
INR: 2.66 — ABNORMAL HIGH (ref 0.00–1.49)
Prothrombin Time: 29.9 seconds — ABNORMAL HIGH (ref 11.6–15.2)
Prothrombin Time: 28.8 s — ABNORMAL HIGH (ref 11.6–15.2)

## 2012-02-26 LAB — BASIC METABOLIC PANEL
BUN: 16 mg/dL (ref 6–23)
CO2: 25 mEq/L (ref 19–32)
Calcium: 9.7 mg/dL (ref 8.4–10.5)
Chloride: 96 mEq/L (ref 96–112)
Creatinine, Ser: 1.65 mg/dL — ABNORMAL HIGH (ref 0.50–1.10)
GFR calc Af Amer: 39 mL/min — ABNORMAL LOW (ref >90)
GFR calc non Af Amer: 34 mL/min — ABNORMAL LOW (ref >90)
Glucose, Bld: 463 mg/dL — ABNORMAL HIGH (ref 70–99)
Potassium: 4.6 mEq/L (ref 3.5–5.1)
Sodium: 133 mEq/L — ABNORMAL LOW (ref 135–145)

## 2012-02-26 LAB — URINALYSIS, ROUTINE W REFLEX MICROSCOPIC
Bilirubin Urine: NEGATIVE
Glucose, UA: >1000 mg/dL — AB
Hgb urine dipstick: NEGATIVE
Ketones, ur: NEGATIVE mg/dL
Leukocytes, UA: NEGATIVE
Nitrite: NEGATIVE
Protein, ur: NEGATIVE mg/dL
Specific Gravity, Urine: 1.021 (ref 1.005–1.030)
Urobilinogen, UA: 0.2 mg/dL (ref 0.0–1.0)
pH: 5 (ref 5.0–8.0)

## 2012-02-26 LAB — URINE MICROSCOPIC-ADD ON

## 2012-02-26 LAB — POCT I-STAT TROPONIN I: Troponin i, poc: 0 ng/mL (ref 0.00–0.08)

## 2012-02-26 LAB — DIFFERENTIAL
Basophils Absolute: 0 10*3/uL (ref 0.0–0.1)
Basophils Relative: 0 % (ref 0–1)
Eosinophils Absolute: 0.1 10*3/uL (ref 0.0–0.7)
Eosinophils Relative: 1 % (ref 0–5)
Lymphocytes Relative: 32 % (ref 12–46)
Lymphs Abs: 3.2 10*3/uL (ref 0.7–4.0)
Monocytes Absolute: 0.7 10*3/uL (ref 0.1–1.0)
Monocytes Relative: 7 % (ref 3–12)
Neutro Abs: 5.8 10*3/uL (ref 1.7–7.7)
Neutrophils Relative %: 59 % (ref 43–77)

## 2012-02-26 LAB — COMPREHENSIVE METABOLIC PANEL WITH GFR
ALT: 12 U/L (ref 0–35)
AST: 15 U/L (ref 0–37)
Albumin: 3.2 g/dL — ABNORMAL LOW (ref 3.5–5.2)
Alkaline Phosphatase: 113 U/L (ref 39–117)
BUN: 17 mg/dL (ref 6–23)
CO2: 26 meq/L (ref 19–32)
Calcium: 9.5 mg/dL (ref 8.4–10.5)
Chloride: 96 meq/L (ref 96–112)
Creatinine, Ser: 1.54 mg/dL — ABNORMAL HIGH (ref 0.50–1.10)
GFR calc Af Amer: 43 mL/min — ABNORMAL LOW
GFR calc non Af Amer: 37 mL/min — ABNORMAL LOW
Glucose, Bld: 384 mg/dL — ABNORMAL HIGH (ref 70–99)
Potassium: 4.5 meq/L (ref 3.5–5.1)
Sodium: 133 meq/L — ABNORMAL LOW (ref 135–145)
Total Bilirubin: 0.3 mg/dL (ref 0.3–1.2)
Total Protein: 8.3 g/dL (ref 6.0–8.3)

## 2012-02-26 LAB — GLUCOSE, CAPILLARY
Glucose-Capillary: 453 mg/dL — ABNORMAL HIGH (ref 70–99)
Glucose-Capillary: 439 mg/dL — ABNORMAL HIGH (ref 70–99)
Glucose-Capillary: 359 mg/dL — ABNORMAL HIGH (ref 70–99)

## 2012-02-26 LAB — CARDIAC PANEL(CRET KIN+CKTOT+MB+TROPI)
CK, MB: 0.9 ng/mL (ref 0.3–4.0)
Relative Index: 0.7 (ref 0.0–2.5)
Total CK: 128 U/L (ref 7–177)
Troponin I: <0.3 ng/mL

## 2012-02-26 LAB — APTT: aPTT: 32 s (ref 24–37)

## 2012-02-26 LAB — PRO B NATRIURETIC PEPTIDE
Pro B Natriuretic peptide (BNP): 299.9 pg/mL — ABNORMAL HIGH (ref 0–125)
Pro B Natriuretic peptide (BNP): 301.4 pg/mL — ABNORMAL HIGH (ref 0–125)

## 2012-02-26 LAB — MAGNESIUM: Magnesium: 1.7 mg/dL (ref 1.5–2.5)

## 2012-02-26 MED ORDER — PNEUMOCOCCAL VAC POLYVALENT 25 MCG/0.5ML IJ INJ
0.5000 mL | INJECTION | INTRAMUSCULAR | Status: AC
  Administered 2012-02-27: 0.5 mL via INTRAMUSCULAR
  Filled 2012-02-26: qty 0.5

## 2012-02-26 MED ORDER — ASPIRIN 81 MG PO CHEW
324.0000 mg | CHEWABLE_TABLET | Freq: Once | ORAL | Status: DC
  Filled 2012-02-26: qty 4

## 2012-02-26 MED ORDER — WARFARIN SODIUM 2 MG PO TABS
2.0000 mg | ORAL_TABLET | Freq: Once | ORAL | Status: AC
  Administered 2012-02-26: 2 mg via ORAL
  Filled 2012-02-26: qty 1

## 2012-02-26 MED ORDER — INSULIN ASPART 100 UNIT/ML ~~LOC~~ SOLN
0.0000 [IU] | Freq: Three times a day (TID) | SUBCUTANEOUS | Status: DC
  Administered 2012-02-27 – 2012-02-28 (×4): 15 [IU] via SUBCUTANEOUS
  Administered 2012-02-28: 11 [IU] via SUBCUTANEOUS
  Administered 2012-02-29: 8 [IU] via SUBCUTANEOUS

## 2012-02-26 MED ORDER — INSULIN ASPART 100 UNIT/ML ~~LOC~~ SOLN
8.0000 [IU] | Freq: Once | SUBCUTANEOUS | Status: AC
  Administered 2012-02-26: 8 [IU] via SUBCUTANEOUS
  Filled 2012-02-26: qty 1

## 2012-02-26 MED ORDER — CLORAZEPATE DIPOTASSIUM 7.5 MG PO TABS
7.5000 mg | ORAL_TABLET | Freq: Two times a day (BID) | ORAL | Status: DC
  Administered 2012-02-26 – 2012-02-29 (×6): 7.5 mg via ORAL
  Filled 2012-02-26 (×6): qty 2

## 2012-02-26 MED ORDER — INSULIN GLARGINE 100 UNIT/ML ~~LOC~~ SOLN
15.0000 [IU] | Freq: Two times a day (BID) | SUBCUTANEOUS | Status: DC
  Administered 2012-02-26 – 2012-02-27 (×2): 15 [IU] via SUBCUTANEOUS

## 2012-02-26 MED ORDER — CARVEDILOL 12.5 MG PO TABS
12.5000 mg | ORAL_TABLET | Freq: Two times a day (BID) | ORAL | Status: DC
  Administered 2012-02-27 – 2012-02-29 (×5): 12.5 mg via ORAL
  Filled 2012-02-26 (×7): qty 1

## 2012-02-26 MED ORDER — PANTOPRAZOLE SODIUM 40 MG PO TBEC
40.0000 mg | DELAYED_RELEASE_TABLET | Freq: Every day | ORAL | Status: DC
  Administered 2012-02-27 – 2012-02-28 (×2): 40 mg via ORAL
  Filled 2012-02-26 (×2): qty 1

## 2012-02-26 MED ORDER — ATORVASTATIN CALCIUM 20 MG PO TABS
20.0000 mg | ORAL_TABLET | Freq: Every day | ORAL | Status: DC
  Administered 2012-02-27 – 2012-02-28 (×2): 20 mg via ORAL
  Filled 2012-02-26 (×3): qty 1

## 2012-02-26 MED ORDER — WARFARIN - PHARMACIST DOSING INPATIENT: Freq: Every day | Status: DC

## 2012-02-26 MED ORDER — ACETAMINOPHEN 325 MG PO TABS
650.0000 mg | ORAL_TABLET | ORAL | Status: DC | PRN
  Administered 2012-02-27 – 2012-02-29 (×4): 650 mg via ORAL
  Filled 2012-02-26 (×2): qty 2
  Filled 2012-02-26: qty 1

## 2012-02-26 MED ORDER — NITROGLYCERIN 0.4 MG SL SUBL: 0.4000 mg | SUBLINGUAL_TABLET | SUBLINGUAL | Status: DC | PRN

## 2012-02-26 MED ORDER — GABAPENTIN 300 MG PO CAPS
300.0000 mg | ORAL_CAPSULE | Freq: Every day | ORAL | Status: DC
  Administered 2012-02-26 – 2012-02-28 (×3): 300 mg via ORAL
  Filled 2012-02-26 (×4): qty 1

## 2012-02-26 MED ORDER — FUROSEMIDE 40 MG PO TABS
40.0000 mg | ORAL_TABLET | Freq: Every day | ORAL | Status: DC
  Administered 2012-02-27 – 2012-02-29 (×3): 40 mg via ORAL
  Filled 2012-02-26 (×4): qty 1

## 2012-02-26 MED ORDER — POTASSIUM CHLORIDE CRYS ER 20 MEQ PO TBCR
20.0000 meq | EXTENDED_RELEASE_TABLET | Freq: Every day | ORAL | Status: DC
  Administered 2012-02-27 – 2012-02-29 (×3): 20 meq via ORAL
  Filled 2012-02-26 (×3): qty 1

## 2012-02-26 MED ORDER — INSULIN REGULAR HUMAN 100 UNIT/ML IJ SOLN: 8.0000 [IU] | Freq: Once | INTRAMUSCULAR | Status: DC

## 2012-02-26 MED ORDER — OLOPATADINE HCL 0.1 % OP SOLN
1.0000 [drp] | Freq: Two times a day (BID) | OPHTHALMIC | Status: DC
  Administered 2012-02-26 – 2012-02-29 (×6): 1 [drp] via OPHTHALMIC
  Filled 2012-02-26: qty 5

## 2012-02-26 MED ORDER — ONDANSETRON HCL 4 MG/2ML IJ SOLN: 4.0000 mg | Freq: Four times a day (QID) | INTRAMUSCULAR | Status: DC | PRN

## 2012-02-26 MED ORDER — ASPIRIN EC 81 MG PO TBEC
81.0000 mg | DELAYED_RELEASE_TABLET | Freq: Every day | ORAL | Status: DC
  Administered 2012-02-27 – 2012-02-29 (×3): 81 mg via ORAL
  Filled 2012-02-26 (×3): qty 1

## 2012-02-26 MED ORDER — SPIRONOLACTONE 25 MG PO TABS
25.0000 mg | ORAL_TABLET | Freq: Every day | ORAL | Status: DC
  Administered 2012-02-27 – 2012-02-29 (×3): 25 mg via ORAL
  Filled 2012-02-26 (×3): qty 1

## 2012-02-26 NOTE — ED Notes (Signed)
Pt c/o hyperglycemia x several weeks despite changing of meds; pt sts some abd pain and CP x 3 days; pt sts no appetite; pt told to come here by Dr Montez Morita

## 2012-02-26 NOTE — ED Provider Notes (Signed)
History     CSN: [id number]  Arrival date & time 02/26/12  81   First MD Initiated Contact with Patient 02/26/12 1558      Chief Complaint  Patient presents with  . Hyperglycemia  . Chest Pain    (Consider location/radiation/quality/duration/timing/severity/associated sxs/prior treatment) Patient is a 56 y.o. female presenting with chest pain. The history is provided by the patient.  Chest Pain The chest pain began 3 - 5 days ago. Duration of episode(s) is 3 days. Chest pain occurs constantly. The chest pain is unchanged. Associated with: nothing. The severity of the pain is moderate. The quality of the pain is described as heavy. The pain does not radiate. Exacerbated by: nothing. Primary symptoms include shortness of breath. Pertinent negatives for primary symptoms include no fever, no fatigue, no cough, no wheezing, no palpitations, no abdominal pain, no nausea, no vomiting and no dizziness.  Pertinent negatives for associated symptoms include no diaphoresis and no numbness. She tried nothing for the symptoms. Risk factors include lack of exercise, obesity and sedentary lifestyle.  Her past medical history is significant for CHF, diabetes, hyperlipidemia and hypertension.  Pertinent negatives for past medical history include no seizures.  Her family medical history is significant for CAD in family.  Procedure history is positive for cardiac catheterization and echocardiogram.     Past Medical History  Diagnosis Date  . HTN (hypertension)   . Heart failure   . DM (diabetes mellitus)   . Hyperlipidemia   . Allergic rhinitis   . Sleep apnea     Past Surgical History  Procedure Date  . Abdominal hysterectomy     History reviewed. No pertinent family history.  History  Substance Use Topics  . Smoking status: Never Smoker   . Smokeless tobacco: Not on file  . Alcohol Use: Not on file    OB History    Grav Para Term Preterm Abortions TAB SAB Ect Mult Living          Review of Systems  Constitutional: Positive for appetite change. Negative for fever, chills, diaphoresis and fatigue.  HENT: Negative for ear pain, congestion, sore throat, facial swelling, mouth sores, trouble swallowing, neck pain and neck stiffness.   Eyes: Negative.   Respiratory: Positive for shortness of breath. Negative for apnea, cough, chest tightness and wheezing.   Cardiovascular: Positive for chest pain. Negative for palpitations and leg swelling.  Gastrointestinal: Negative for nausea, vomiting, abdominal pain, diarrhea and abdominal distention.  Genitourinary: Negative for hematuria, flank pain, vaginal discharge, difficulty urinating and menstrual problem.  Musculoskeletal: Negative for back pain and gait problem.  Skin: Negative for rash and wound.  Neurological: Negative for dizziness, tremors, seizures, syncope, facial asymmetry, numbness and headaches.  Psychiatric/Behavioral: Negative.   All other systems reviewed and are negative.    Allergies  Sulfa antibiotics  Home Medications   Current Outpatient Rx  Name Route Sig Dispense Refill  . CARVEDILOL 12.5 MG PO TABS Oral Take 12.5 mg by mouth Twice daily.     Marland Kitchen CLORAZEPATE DIPOTASSIUM 15 MG PO TABS Oral Take 15 mg by mouth Twice daily.     . CRESTOR 10 MG PO TABS Oral Take 10 mg by mouth daily.     . FUROSEMIDE 40 MG PO TABS Oral Take 40 mg by mouth daily.     Marland Kitchen GABAPENTIN 300 MG PO CAPS Oral Take 300 mg by mouth at bedtime.     . INSULIN ASPART 100 UNIT/ML [place] SOLN Subcutaneous Inject 5-12 Units  into the skin every 6 (six) hours. 200-300 takes 5 units, 301-400 takes 7 units, 401-500 takes 12 units.    Marland Kitchen JANUVIA 50 MG PO TABS Oral Take 50 mg by mouth daily.     Marland Kitchen LANTUS 100 UNIT/ML [place] SOLN Subcutaneous Inject 12-20 Units into the skin daily. 20 units in the am and 12 units in the pm.    . OMEPRAZOLE 40 MG PO CPDR Oral Take 40 mg by mouth daily.     Marland Kitchen PATANOL 0.1 % OP SOLN Both Eyes Place 1 drop into both  eyes as directed.     Marland Kitchen POTASSIUM CHLORIDE CRYS ER 20 MEQ PO TBCR Oral Take 20 mEq by mouth Twice daily.     Marland Kitchen SPIRONOLACTONE 25 MG PO TABS Oral Take 25 mg by mouth daily.     . WARFARIN SODIUM 2 MG PO TABS Oral Take 2 mg by mouth See admin instructions. Takes 2 mg all days except no coumadin on sundays.      BP 125/81  Pulse 64  Temp 98.1 F (36.7 C) (Oral)  Resp 18  SpO2 98%  Physical Exam  Nursing note and vitals reviewed. Constitutional: She is oriented to person, place, and time. She appears well-developed and well-nourished. No distress.  HENT:  Head: Normocephalic and atraumatic.  Right Ear: External ear normal.  Left Ear: External ear normal.  Nose: Nose normal.  Mouth/Throat: Oropharynx is clear and moist. No oropharyngeal exudate.  Eyes: Conjunctivae and EOM are normal. Pupils are equal, round, and reactive to light. Right eye exhibits no discharge. Left eye exhibits no discharge.  Neck: Normal range of motion. Neck supple. No JVD present. No tracheal deviation present. No thyromegaly present.  Cardiovascular: Normal rate, regular rhythm, normal heart sounds and intact distal pulses.  Exam reveals no gallop and no friction rub.   No murmur heard. Pulmonary/Chest: Effort normal and breath sounds normal. No respiratory distress. She has no wheezes. She has no rales. She exhibits no tenderness.  Abdominal: Soft. Bowel sounds are normal. She exhibits no distension. There is no tenderness. There is no rebound and no guarding.  Musculoskeletal: Normal range of motion. She exhibits edema (2+ pitting edema in bilateral legs).  Lymphadenopathy:    She has no cervical adenopathy.  Neurological: She is alert and oriented to person, place, and time. No cranial nerve deficit. Coordination normal.  Skin: Skin is warm. No rash noted. She is not diaphoretic.  Psychiatric: She has a normal mood and affect. Her behavior is normal. Judgment and thought content normal.    ED Course    Procedures (including critical care time)  Labs Reviewed  BASIC METABOLIC PANEL - Abnormal; Notable for the following:    Sodium 133 (*)     Glucose, Bld 463 (*)     Creatinine, Ser 1.65 (*)     GFR calc non Af Amer 34 (*)     GFR calc Af Amer 39 (*)     All other components within normal limits  PRO B NATRIURETIC PEPTIDE - Abnormal; Notable for the following:    Pro B Natriuretic peptide (BNP) 299.9 (*)     All other components within normal limits  PROTIME-INR - Abnormal; Notable for the following:    Prothrombin Time 29.9 (*)     INR 2.79 (*)     All other components within normal limits  GLUCOSE, CAPILLARY - Abnormal; Notable for the following:    Glucose-Capillary 453 (*)     All other  components within normal limits  GLUCOSE, CAPILLARY - Abnormal; Notable for the following:    Glucose-Capillary 439 (*)     All other components within normal limits  CBC  POCT I-STAT TROPONIN I  URINALYSIS, ROUTINE W REFLEX MICROSCOPIC   Dg Chest 2 View  02/26/2012  *RADIOLOGY REPORT*  Clinical Data: Chest pain, shortness of breath, nausea, cough  CHEST - 2 VIEW  Comparison: Chest x-ray of 01/13/2011  Findings: No active infiltrate or effusion is seen.  There is mild peribronchial thickening which may indicate bronchitis. Mediastinal contours are stable.  The heart is mildly enlarged and stable.  No bony abnormality is seen.  Surgical clips are present in the right upper quadrant from prior cholecystectomy.  IMPRESSION: No pneumonia.  Peribronchial thickening may indicate bronchitis.  Original Report Authenticated By: Joretta Bachelor, M.D.     No diagnosis found.    MDM  56 year old female patient with past medical history of hypertension congestive heart failure diabetes hyperlipidemia presents with 3 days of continuous chest pain. Patient says she's had this kind of pain in the past and is similar to this pain episodes. Patient has had a coronary catheterization without evidence of coronary  artery disease and has never needed stents per her memory. Patient has a history of blood clots and is on Coumadin. Patient says this pain has been going on for about 3 days she also feels like she cannot eat well. Patient says she's not orthopneic patient says she does get a little more dyspneic with exertion but that is not for chest pain worse. Patient says she can't eat bacon etc. chest pain worse. Patient says she can't see anything that makes it better. No physical exam findings suggesting worsening of heart failure such as a history or worsening peripheral edema or weight gain.  Results for orders placed during the hospital encounter of 02/26/12  CBC      Component Value Range   WBC 9.1  4.0 - 10.5 K/uL   RBC 4.92  3.87 - 5.11 MIL/uL   Hemoglobin 14.3  12.0 - 15.0 g/dL   HCT 41.8  36.0 - 46.0 %   MCV 85.0  78.0 - 100.0 fL   MCH 29.1  26.0 - 34.0 pg   MCHC 34.2  30.0 - 36.0 g/dL   RDW 12.9  11.5 - 15.5 %   Platelets 214  150 - 400 K/uL  BASIC METABOLIC PANEL      Component Value Range   Sodium 133 (*) 135 - 145 mEq/L   Potassium 4.6  3.5 - 5.1 mEq/L   Chloride 96  96 - 112 mEq/L   CO2 25  19 - 32 mEq/L   Glucose, Bld 463 (*) 70 - 99 mg/dL   BUN 16  6 - 23 mg/dL   Creatinine, Ser 1.65 (*) 0.50 - 1.10 mg/dL   Calcium 9.7  8.4 - 10.5 mg/dL   GFR calc non Af Amer 34 (*) >90 mL/min   GFR calc Af Amer 39 (*) >90 mL/min  PRO B NATRIURETIC PEPTIDE      Component Value Range   Pro B Natriuretic peptide (BNP) 299.9 (*) 0 - 125 pg/mL  PROTIME-INR      Component Value Range   Prothrombin Time 29.9 (*) 11.6 - 15.2 seconds   INR 2.79 (*) 0.00 - 1.49  GLUCOSE, CAPILLARY      Component Value Range   Glucose-Capillary 453 (*) 70 - 99 mg/dL  POCT I-STAT TROPONIN I  Component Value Range   Troponin i, poc 0.00  0.00 - 0.08 ng/mL   Comment 3           GLUCOSE, CAPILLARY      Component Value Range   Glucose-Capillary 439 (*) 70 - 99 mg/dL     Doubt worsening of congestive heart  failure history BNP remains stable and is no worsening signs of heart failure. Doubt pulmonary embolism as her Well's score is 1-1/2 given her history and therapeutic INR. Chest x-ray does not show pneumothorax with pneumonia. EKG is unchanged from previous EKGs. Initial troponin is negative. Patient's TIMI score is 1. This is a patient of Dr.Spriull's. I will contact his admitting team to evaluate this patient for possible admission for chest pain and uncontrolled diabetes and hyperglycemia.  Case discussed with Dr. Olean Ree, MD 02/26/12 [phone]

## 2012-02-26 NOTE — ED Notes (Signed)
Pt. Reports problems keeping blood sugar under control. Today c/o of chest pain and headache.

## 2012-02-26 NOTE — Progress Notes (Signed)
ANTICOAGULATION CONSULT NOTE - Initial Consult  Pharmacy Consult for Coumadin Indication: atrial fibrillation   Allergies  Allergen Reactions  . Sulfa Antibiotics Itching    Patient Measurements:  Pending   Vital Signs: Temp: 98.1 F (36.7 C) (07/08 1327) Temp src: Oral (07/08 1327) BP: 111/77 mmHg (07/08 1939) Pulse Rate: 64  (07/08 1327)  Labs:  Basename 02/26/12 1352  HGB 14.3  HCT 41.8  PLT 214  APTT --  LABPROT 29.9*  INR 2.79*  HEPARINUNFRC --  CREATININE 1.65*  CKTOTAL --  CKMB --  TROPONINI --    The CrCl is unknown because both a height and weight (above a minimum accepted value) are required for this calculation.   Medical History: Past Medical History  Diagnosis Date  . HTN (hypertension)   . Heart failure   . DM (diabetes mellitus)   . Hyperlipidemia   . Allergic rhinitis   . Sleep apnea     Medications:  Prescriptions prior to admission  Medication Sig Dispense Refill  . carvedilol (COREG) 12.5 MG tablet Take 12.5 mg by mouth Twice daily.       . clorazepate (TRANXENE) 15 MG tablet Take 15 mg by mouth Twice daily.       . CRESTOR 10 MG tablet Take 10 mg by mouth daily.       . furosemide (LASIX) 40 MG tablet Take 40 mg by mouth daily.       Marland Kitchen gabapentin (NEURONTIN) 300 MG capsule Take 300 mg by mouth at bedtime.       . insulin aspart (NOVOLOG) 100 UNIT/ML injection Inject 5-12 Units into the skin every 6 (six) hours. 200-300 takes 5 units, 301-400 takes 7 units, 401-500 takes 12 units.      Marland Kitchen JANUVIA 50 MG tablet Take 50 mg by mouth daily.       Marland Kitchen LANTUS 100 UNIT/ML injection Inject 12-20 Units into the skin daily. 20 units in the am and 12 units in the pm.      . omeprazole (PRILOSEC) 40 MG capsule Take 40 mg by mouth daily.       Marland Kitchen PATANOL 0.1 % ophthalmic solution Place 1 drop into both eyes as directed.       . potassium chloride SA (K-DUR,KLOR-CON) 20 MEQ tablet Take 20 mEq by mouth Twice daily.       Marland Kitchen spironolactone (ALDACTONE) 25  MG tablet Take 25 mg by mouth daily.       Marland Kitchen warfarin (COUMADIN) 2 MG tablet Take 2 mg by mouth See admin instructions. Takes 2 mg all days except no coumadin on sundays.        Assessment: 3 YOF on chronic Coumadin for Afib admitted 02/26/12 with chest pain.  Patient also has hx of diastolic heart failure.   INR today is 2.79. Goal is 2-3. Patient takes 2mg  all days except for no Coumadin on Sundays.   Goal of Therapy:  INR 2-3   Plan:  1. Coumadin 2mg  po x1 tonight. 2. Follow-up PT/INR daily.   Brain Hilts 02/26/2012,8:00 PM

## 2012-02-26 NOTE — H&P (Signed)
[name] is an 56 y.o. female.   Chief Complaint: Chest pain HPI: Patient is 56 year old female with past rectal history significant for hypertension insulin requiring diabetes mellitus history of congestive heart failure secondary to systolic dysfunction hypercholesteremia morbid obesity obstructive sleep apnea chronic kidney disease stage III, came to the ER complaining of recurrent retrosternal chest pain off and on for last 2-3 days described as someone sitting on the chest and radiate over 10 radiating to left arm and left side of head associated with nausea and mild shortness of breath. Patient also gives history of exertional dyspnea and leg swelling. States her blood sugar has been running between 400-500 lately. Denies any cough fever chills denies any urinary complaints. Patient seen PMD recently and was put on sliding scale insulin without much improvement in her blood sugar.  Past Medical History  Diagnosis Date  . HTN (hypertension)   . Heart failure   . DM (diabetes mellitus)   . Hyperlipidemia   . Allergic rhinitis   . Sleep apnea     Past Surgical History  Procedure Date  . Abdominal hysterectomy     History reviewed. No pertinent family history. Social History:  reports that she has never smoked. She does not have any smokeless tobacco history on file. Her alcohol and drug histories not on file.  Allergies:  Allergies  Allergen Reactions  . Sulfa Antibiotics Itching     (Not in a hospital admission)  Results for orders placed during the hospital encounter of 02/26/12 (from the past 48 hour(s))  GLUCOSE, CAPILLARY     Status: Abnormal   Collection Time   02/26/12  1:33 PM      Component Value Range Comment   Glucose-Capillary 453 (*) 70 - 99 mg/dL   CBC     Status: Normal   Collection Time   02/26/12  1:52 PM      Component Value Range Comment   WBC 9.1  4.0 - 10.5 K/uL    RBC 4.92  3.87 - 5.11 MIL/uL    Hemoglobin 14.3  12.0 - 15.0 g/dL    HCT 41.8   36.0 - 46.0 %    MCV 85.0  78.0 - 100.0 fL    MCH 29.1  26.0 - 34.0 pg    MCHC 34.2  30.0 - 36.0 g/dL    RDW 12.9  11.5 - 15.5 %    Platelets 214  150 - 400 K/uL   BASIC METABOLIC PANEL     Status: Abnormal   Collection Time   02/26/12  1:52 PM      Component Value Range Comment   Sodium 133 (*) 135 - 145 mEq/L    Potassium 4.6  3.5 - 5.1 mEq/L    Chloride 96  96 - 112 mEq/L    CO2 25  19 - 32 mEq/L    Glucose, Bld 463 (*) 70 - 99 mg/dL    BUN 16  6 - 23 mg/dL    Creatinine, Ser 1.65 (*) 0.50 - 1.10 mg/dL    Calcium 9.7  8.4 - 10.5 mg/dL    GFR calc non Af Amer 34 (*) >90 mL/min    GFR calc Af Amer 39 (*) >90 mL/min   PRO B NATRIURETIC PEPTIDE     Status: Abnormal   Collection Time   02/26/12  1:52 PM      Component Value Range Comment   Pro B Natriuretic peptide (BNP) 299.9 (*) 0 - 125 pg/mL  PROTIME-INR     Status: Abnormal   Collection Time   02/26/12  1:52 PM      Component Value Range Comment   Prothrombin Time 29.9 (*) 11.6 - 15.2 seconds    INR 2.79 (*) 0.00 - 1.49   POCT I-STAT TROPONIN I     Status: Normal   Collection Time   02/26/12  2:12 PM      Component Value Range Comment   Troponin i, poc 0.00  0.00 - 0.08 ng/mL    Comment 3            GLUCOSE, CAPILLARY     Status: Abnormal   Collection Time   02/26/12  4:28 PM      Component Value Range Comment   Glucose-Capillary 439 (*) 70 - 99 mg/dL    Dg Chest 2 View  02/26/2012  *RADIOLOGY REPORT*  Clinical Data: Chest pain, shortness of breath, nausea, cough  CHEST - 2 VIEW  Comparison: Chest x-ray of 01/13/2011  Findings: No active infiltrate or effusion is seen.  There is mild peribronchial thickening which may indicate bronchitis. Mediastinal contours are stable.  The heart is mildly enlarged and stable.  No bony abnormality is seen.  Surgical clips are present in the right upper quadrant from prior cholecystectomy.  IMPRESSION: No pneumonia.  Peribronchial thickening may indicate bronchitis.  Original Report  Authenticated By: Joretta Bachelor, M.D.    Review of Systems  Constitutional: Negative for fever and chills.  Eyes: Negative for blurred vision and double vision.  Respiratory: Positive for shortness of breath. Negative for cough, hemoptysis and sputum production.   Cardiovascular: Positive for chest pain. Negative for palpitations, orthopnea, claudication and leg swelling.  Gastrointestinal: Positive for nausea. Negative for vomiting and abdominal pain.  Neurological: Negative for dizziness.    Blood pressure 125/81, pulse 64, temperature 98.1 F (36.7 C), temperature source Oral, resp. rate 18, SpO2 98.00%. Physical Exam  Constitutional: She is oriented to person, place, and time. She appears well-developed and well-nourished.  HENT:  Head: Normocephalic and atraumatic.  Eyes: Conjunctivae and EOM are normal. Pupils are equal, round, and reactive to light.  Neck: Normal range of motion. Neck supple. No JVD present. No tracheal deviation present. No thyromegaly present.  Cardiovascular:       Irregularly irregular S1 and S2 soft there is soft systolic murmur and S3 gallop noted  Respiratory: Effort normal and breath sounds normal. No respiratory distress. She has no wheezes. She has no rales.  GI: Bowel sounds are normal. She exhibits distension. There is no tenderness. There is no rebound.  Musculoskeletal: She exhibits no edema and no tenderness.  Neurological: She is alert and oriented to person, place, and time.     Assessment/Plan Recurrent chest pain rule out MI Uncontrolled diabetes mellitus Hypertension Obstructive sleep apnea Chronic A. fib Mild decompensated systolic heart failure Morbid obesity Chronic kidney disease History of DVT Plan As per orders Leverne Amrhein N 02/26/2012, 7:25 PM

## 2012-02-27 ENCOUNTER — Encounter (HOSPITAL_COMMUNITY): Payer: Self-pay | Admitting: *Deleted

## 2012-02-27 ENCOUNTER — Inpatient Hospital Stay (HOSPITAL_COMMUNITY): Payer: Medicare Other

## 2012-02-27 LAB — CARDIAC PANEL(CRET KIN+CKTOT+MB+TROPI)
CK, MB: 1.1 ng/mL (ref 0.3–4.0)
CK, MB: 1.1 ng/mL (ref 0.3–4.0)
Relative Index: 0.9 (ref 0.0–2.5)
Relative Index: 0.9 (ref 0.0–2.5)
Total CK: 116 U/L (ref 7–177)
Total CK: 118 U/L (ref 7–177)
Troponin I: <0.3 ng/mL (ref <0.30)
Troponin I: <0.3 ng/mL (ref <0.30)

## 2012-02-27 LAB — CBC
HCT: 38.4 % (ref 36.0–46.0)
Hemoglobin: 12.8 g/dL (ref 12.0–15.0)
MCH: 28.1 pg (ref 26.0–34.0)
MCHC: 33.3 g/dL (ref 30.0–36.0)
MCV: 84.4 fL (ref 78.0–100.0)
Platelets: 197 10*3/uL (ref 150–400)
RBC: 4.55 MIL/uL (ref 3.87–5.11)
RDW: 13 % (ref 11.5–15.5)
WBC: 10.2 10*3/uL (ref 4.0–10.5)

## 2012-02-27 LAB — BASIC METABOLIC PANEL
BUN: 17 mg/dL (ref 6–23)
BUN: 18 mg/dL (ref 6–23)
CO2: 25 mEq/L (ref 19–32)
CO2: 26 mEq/L (ref 19–32)
Calcium: 9.1 mg/dL (ref 8.4–10.5)
Calcium: 9.1 mg/dL (ref 8.4–10.5)
Chloride: 95 mEq/L — ABNORMAL LOW (ref 96–112)
Chloride: 92 mEq/L — ABNORMAL LOW (ref 96–112)
Creatinine, Ser: 1.51 mg/dL — ABNORMAL HIGH (ref 0.50–1.10)
Creatinine, Ser: 1.64 mg/dL — ABNORMAL HIGH (ref 0.50–1.10)
GFR calc Af Amer: 44 mL/min — ABNORMAL LOW (ref >90)
GFR calc Af Amer: 40 mL/min — ABNORMAL LOW (ref >90)
GFR calc non Af Amer: 38 mL/min — ABNORMAL LOW (ref >90)
GFR calc non Af Amer: 34 mL/min — ABNORMAL LOW (ref >90)
Glucose, Bld: 439 mg/dL — ABNORMAL HIGH (ref 70–99)
Glucose, Bld: 546 mg/dL — ABNORMAL HIGH (ref 70–99)
Potassium: 4.3 mEq/L (ref 3.5–5.1)
Potassium: 4.7 mEq/L (ref 3.5–5.1)
Sodium: 130 mEq/L — ABNORMAL LOW (ref 135–145)
Sodium: 128 mEq/L — ABNORMAL LOW (ref 135–145)

## 2012-02-27 LAB — HEMOGLOBIN A1C
Hgb A1c MFr Bld: 14.3 % — ABNORMAL HIGH (ref <5.7)
Mean Plasma Glucose: 364 mg/dL — ABNORMAL HIGH (ref <117)

## 2012-02-27 LAB — LIPID PANEL
Cholesterol: 107 mg/dL (ref 0–200)
HDL: 36 mg/dL — ABNORMAL LOW (ref >39)
LDL Cholesterol: 54 mg/dL (ref 0–99)
Total CHOL/HDL Ratio: 3 RATIO
Triglycerides: 87 mg/dL (ref <150)
VLDL: 17 mg/dL (ref 0–40)

## 2012-02-27 LAB — TSH: TSH: 2.123 u[IU]/mL (ref 0.350–4.500)

## 2012-02-27 LAB — PROTIME-INR
INR: 2.64 — ABNORMAL HIGH (ref 0.00–1.49)
Prothrombin Time: 28.6 seconds — ABNORMAL HIGH (ref 11.6–15.2)

## 2012-02-27 LAB — GLUCOSE, CAPILLARY
Glucose-Capillary: 353 mg/dL — ABNORMAL HIGH (ref 70–99)
Glucose-Capillary: 366 mg/dL — ABNORMAL HIGH (ref 70–99)
Glucose-Capillary: 474 mg/dL — ABNORMAL HIGH (ref 70–99)
Glucose-Capillary: 486 mg/dL — ABNORMAL HIGH (ref 70–99)

## 2012-02-27 MED ORDER — ACETAMINOPHEN 325 MG PO TABS
ORAL_TABLET | ORAL | Status: AC
  Filled 2012-02-27: qty 2

## 2012-02-27 MED ORDER — INSULIN GLARGINE 100 UNIT/ML ~~LOC~~ SOLN
20.0000 [IU] | Freq: Two times a day (BID) | SUBCUTANEOUS | Status: DC
  Administered 2012-02-27 – 2012-02-28 (×2): 20 [IU] via SUBCUTANEOUS

## 2012-02-27 MED ORDER — DIGOXIN 0.25 MG/ML IJ SOLN
0.2500 mg | Freq: Once | INTRAMUSCULAR | Status: AC
  Administered 2012-02-27: 0.25 mg via INTRAVENOUS
  Filled 2012-02-27: qty 1

## 2012-02-27 MED ORDER — DIGOXIN 0.25 MG/ML IJ SOLN
0.2500 mg | Freq: Every day | INTRAMUSCULAR | Status: DC
  Administered 2012-02-28: 0.25 mg via INTRAVENOUS
  Filled 2012-02-27 (×4): qty 1

## 2012-02-27 MED ORDER — INSULIN ASPART 100 UNIT/ML ~~LOC~~ SOLN
15.0000 [IU] | Freq: Once | SUBCUTANEOUS | Status: AC
  Administered 2012-02-27: 15 [IU] via SUBCUTANEOUS

## 2012-02-27 MED ORDER — WARFARIN SODIUM 2 MG PO TABS
2.0000 mg | ORAL_TABLET | Freq: Once | ORAL | Status: AC
  Administered 2012-02-27: 2 mg via ORAL
  Filled 2012-02-27 (×2): qty 1

## 2012-02-27 MED ORDER — METOPROLOL TARTRATE 1 MG/ML IV SOLN: 2.5000 mg | Freq: Once | INTRAVENOUS | Status: DC

## 2012-02-27 MED ORDER — METOPROLOL TARTRATE 1 MG/ML IV SOLN
INTRAVENOUS | Status: AC
  Filled 2012-02-27: qty 5

## 2012-02-27 NOTE — Progress Notes (Deleted)
ANTICOAGULATION CONSULT NOTE - [place] for Coumadin Indication: atrial fibrillation   Allergies  Allergen Reactions  . Sulfa Antibiotics Itching   Labs:  [facility] 02/27/12 0304 02/26/12 2112 02/26/12 1352  HGB 12.8 13.8 --  HCT 38.4 40.4 41.8  PLT 197 196 214  APTT -- 32 --  LABPROT 28.6* 28.8* 29.9*  INR 2.64* 2.66* 2.79*  HEPARINUNFRC -- -- --  CREATININE 1.51* 1.54* 1.65*  CKTOTAL 116 128 --  CKMB 1.1 0.9 --  TROPONINI <0.30 <0.30 --    Estimated Creatinine Clearance: 69.5 ml/min (by C-G formula based on Cr of 1.51).  Assessment: 19 YOF on chronic Coumadin for Afib admitted 02/26/12 with chest pain.  Patient also has hx of diastolic heart failure.   INR today is therapeutic at 2.64  Goal of Therapy:  INR 2-3   Plan:  1. Coumadin 2mg  po x1 tonight. 2. Follow-up PT/INR daily.   Loralee Pacas. Gala Romney.D. Clinical Pharmacist Pager [phone] Phone [phone] 02/27/2012 9:40 AM

## 2012-02-27 NOTE — Progress Notes (Signed)
Subjective:  Patient denies any chest pain or shortness of breath. Underwent stress portion of the nuclear stress test had chest pain and shortness of breath resolved at the end of the test. No new EKG changes noted Objective:  Vital Signs in the last 24 hours: Temp:  [98.1 F (36.7 C)-98.9 F (37.2 C)] 98.6 F (37 C) (07/09 0658) Pulse Rate:  [64-122] 110  (07/09 1124) Resp:  [16-20] 20  (07/09 1124) BP: (89-126)/(61-89) 91/75 mmHg (07/09 1124) SpO2:  [98 %-100 %] 99 % (07/09 0658) Weight:  [168.6 kg (371 lb 11.1 oz)-169.146 kg (372 lb 14.4 oz)] 169.146 kg (372 lb 14.4 oz) (07/09 0658)  Intake/Output from previous day: 07/08 0701 - 07/09 0700 In: -  Out: 600 [Urine:600] Intake/Output from this shift:    Physical Exam: Neck: no adenopathy, no carotid bruit, no JVD and supple, symmetrical, trachea midline Lungs: clear to auscultation bilaterally Heart: irregularly irregular rhythm, S1, S2 normal and Soft systolic murmur and S3 gallop noted Abdomen: soft, non-tender; bowel sounds normal; no masses,  no organomegaly Extremities: extremities normal, atraumatic, no cyanosis or edema  Lab Results:  Basename 02/27/12 0304 02/26/12 2112  WBC 10.2 9.8  HGB 12.8 13.8  PLT 197 196    Basename 02/27/12 0304 02/26/12 2112  NA 130* 133*  K 4.3 4.5  CL 95* 96  CO2 25 26  GLUCOSE 439* 384*  BUN 17 17  CREATININE 1.51* 1.54*    Basename 02/27/12 0844 02/27/12 0304  TROPONINI <0.30 <0.30   Hepatic Function Panel  Basename 02/26/12 2112  PROT 8.3  ALBUMIN 3.2*  AST 15  ALT 12  ALKPHOS 113  BILITOT 0.3  BILIDIR --  IBILI --    Basename 02/27/12 0304  CHOL 107   No results found for this basename: PROTIME in the last 72 hours  Imaging: Imaging results have been reviewed and Dg Chest 2 View  02/26/2012  *RADIOLOGY REPORT*  Clinical Data: Chest pain, shortness of breath, nausea, cough  CHEST - 2 VIEW  Comparison: Chest x-ray of 01/13/2011  Findings: No active infiltrate  or effusion is seen.  There is mild peribronchial thickening which may indicate bronchitis. Mediastinal contours are stable.  The heart is mildly enlarged and stable.  No bony abnormality is seen.  Surgical clips are present in the right upper quadrant from prior cholecystectomy.  IMPRESSION: No pneumonia.  Peribronchial thickening may indicate bronchitis.  Original Report Authenticated By: Joretta Bachelor, M.D.    Cardiac Studies:  Assessment/Plan:  Recurrent chest pain MI ruled out Uncontrolled diabetes mellitus  Hypertension  Obstructive sleep apnea  Chronic A. fib with moderate ventricular response Mild decompensated systolic heart failure  Morbid obesity  Chronic kidney disease  History of DVT Plan Increase digoxin per orders Check nuclear stress test results  LOS: 1 day    [name] N 02/27/2012, 11:46 AM

## 2012-02-27 NOTE — Progress Notes (Signed)
UR Completed. Llana Aliment, RN, Nurse Case Manager [phone]

## 2012-02-27 NOTE — Progress Notes (Signed)
Pt with chest pain during lexiscan.  C/O of pain 9 out of 10 initially.  Dr Terrence Dupont present.  O2 2L/[place] applied.  Pain down to 7 within 2 minutes.  92/73 HR 103

## 2012-02-27 NOTE — Progress Notes (Signed)
ANTICOAGULATION CONSULT NOTE - [place] for Coumadin Indication: atrial fibrillation   Allergies  Allergen Reactions  . Sulfa Antibiotics Itching   Labs:  [facility] 02/27/12 0304 02/26/12 2112 02/26/12 1352  HGB 12.8 13.8 --  HCT 38.4 40.4 41.8  PLT 197 196 214  APTT -- 32 --  LABPROT 28.6* 28.8* 29.9*  INR 2.64* 2.66* 2.79*  HEPARINUNFRC -- -- --  CREATININE 1.51* 1.54* 1.65*  CKTOTAL 116 128 --  CKMB 1.1 0.9 --  TROPONINI <0.30 <0.30 --    Estimated Creatinine Clearance: 69.5 ml/min (by C-G formula based on Cr of 1.51).  Assessment: 33 YOF on chronic Coumadin for Afib admitted 02/26/12 with chest pain.  Patient also has hx of diastolic heart failure.   INR today is therapeutic  Goal of Therapy:  INR 2-3   Plan:  1. Coumadin 2mg  po x1 tonight. 2. Follow-up PT/INR daily.   Tad Moore 02/27/2012,8:58 AM

## 2012-02-27 NOTE — Progress Notes (Signed)
Inpatient Diabetes Program Recommendations  AACE/ADA: New Consensus Statement on Inpatient Glycemic Control (2009)  Target Ranges:  Prepandial:   less than 140 mg/dL      Peak postprandial:   less than 180 mg/dL (1-2 hours)      Critically ill patients:  140 - 180 mg/dL    Inpatient Diabetes Program Recommendations Insulin - IV drip/GlucoStabilizer: Insulin gtt to break the resistance then transition to SQ insulin  Will follow.  Thank you  Raoul Pitch [facility] Inpatient Diabetes Coordinator [phone]

## 2012-02-27 NOTE — ED Provider Notes (Signed)
I saw and evaluated the patient, reviewed the resident's note and I agree with the findings and plan.  I saw the patient along with Dr. Kerin Ransom.  The patient has an extensive pmh including pe, dm, chf, htn.  She presents to the er complaining of tightness in the chest, shortness of breath, and elevated blood sugars.  This has been occurring over the past week and is gradually worsening.  She is currently on coumadin.  She called Dr. Alfonse Spruce who recommended she come here for evaluation.  On exam, the patient is afebrile and the vitals are stable.  The heart and lung exam is unremarkable.  The abd is benign and there is mild ble edema.  The workup reveals and elevated sugar, unchanged ekg, and flat cardiac enzymes.  As it has been many years since her last cath, and the presence of multiple risk factors and uncontrolled diabetes, we have consulted the physician oncall for Dr. Alfonse Spruce to discuss admission.  Veryl Speak, MD 02/27/12 [phone]

## 2012-02-28 ENCOUNTER — Inpatient Hospital Stay (HOSPITAL_COMMUNITY): Payer: Medicare Other

## 2012-02-28 LAB — GLUCOSE, CAPILLARY
Glucose-Capillary: 409 mg/dL — ABNORMAL HIGH (ref 70–99)
Glucose-Capillary: 303 mg/dL — ABNORMAL HIGH (ref 70–99)
Glucose-Capillary: 398 mg/dL — ABNORMAL HIGH (ref 70–99)
Glucose-Capillary: 397 mg/dL — ABNORMAL HIGH (ref 70–99)

## 2012-02-28 LAB — BASIC METABOLIC PANEL
BUN: 16 mg/dL (ref 6–23)
CO2: 30 mEq/L (ref 19–32)
Calcium: 9.3 mg/dL (ref 8.4–10.5)
Chloride: 97 mEq/L (ref 96–112)
Creatinine, Ser: 1.52 mg/dL — ABNORMAL HIGH (ref 0.50–1.10)
GFR calc Af Amer: 43 mL/min — ABNORMAL LOW (ref >90)
GFR calc non Af Amer: 38 mL/min — ABNORMAL LOW (ref >90)
Glucose, Bld: 357 mg/dL — ABNORMAL HIGH (ref 70–99)
Potassium: 4.3 mEq/L (ref 3.5–5.1)
Sodium: 135 mEq/L (ref 135–145)

## 2012-02-28 LAB — CBC
HCT: 39.6 % (ref 36.0–46.0)
Hemoglobin: 13.1 g/dL (ref 12.0–15.0)
MCH: 28.2 pg (ref 26.0–34.0)
MCHC: 33.1 g/dL (ref 30.0–36.0)
MCV: 85.2 fL (ref 78.0–100.0)
Platelets: 204 10*3/uL (ref 150–400)
RBC: 4.65 MIL/uL (ref 3.87–5.11)
RDW: 13.1 % (ref 11.5–15.5)
WBC: 8.8 10*3/uL (ref 4.0–10.5)

## 2012-02-28 LAB — PRO B NATRIURETIC PEPTIDE: Pro B Natriuretic peptide (BNP): 120.8 pg/mL (ref 0–125)

## 2012-02-28 LAB — PROTIME-INR
INR: 3.03 — ABNORMAL HIGH (ref 0.00–1.49)
Prothrombin Time: 31.9 seconds — ABNORMAL HIGH (ref 11.6–15.2)

## 2012-02-28 MED ORDER — INSULIN GLARGINE 100 UNIT/ML ~~LOC~~ SOLN
30.0000 [IU] | Freq: Two times a day (BID) | SUBCUTANEOUS | Status: DC
  Administered 2012-02-28 – 2012-02-29 (×2): 30 [IU] via SUBCUTANEOUS

## 2012-02-28 MED ORDER — TECHNETIUM TC 99M TETROFOSMIN IV KIT
30.0000 | PACK | Freq: Once | INTRAVENOUS | Status: AC | PRN
  Administered 2012-02-27: 30 via INTRAVENOUS

## 2012-02-28 MED ORDER — DIGOXIN 250 MCG PO TABS
0.2500 mg | ORAL_TABLET | Freq: Every day | ORAL | Status: DC
  Administered 2012-02-29: 0.25 mg via ORAL
  Filled 2012-02-28: qty 1

## 2012-02-28 MED ORDER — TECHNETIUM TC 99M TETROFOSMIN IV KIT
30.0000 | PACK | Freq: Once | INTRAVENOUS | Status: AC | PRN
  Administered 2012-02-28: 30 via INTRAVENOUS

## 2012-02-28 NOTE — Progress Notes (Signed)
Inpatient Diabetes Program Recommendations  AACE/ADA: New Consensus Statement on Inpatient Glycemic Control (2009)  Target Ranges:  Prepandial:   less than 140 mg/dL      Peak postprandial:   less than 180 mg/dL (1-2 hours)      Critically ill patients:  140 - 180 mg/dL   Reason for Visit: Glucose out of control  Inpatient Diabetes Program Recommendations Insulin - IV drip/GlucoStabilizer: Insulin gtt to break the resistance then transition to SQ insulin Diet: added carbohydrate modified to diet orders.    Note: Pt received total of 45 units correction and 35 units Lantus for total of 75 units insulin yesterday, and cbg's are still in 300's and 400's Please start an IV insulin drip per GS asap.  Pt can eat and be covered on the drip using the [place].

## 2012-02-28 NOTE — Progress Notes (Signed)
Patient's BP is 99/69, MD notified; will continue to monitor patient______________________D. Owens Shark RN

## 2012-02-28 NOTE — Progress Notes (Signed)
Subjective:  Patient denies any chest pain or shortness of breath. Denies any palpitations had episode of paroxysmal A. fib with RVR. Blood sugar still remains elevated. Denies cough fever denies urinary complaints  Objective:  Vital Signs in the last 24 hours: Temp:  [97.3 F (36.3 C)-98.3 F (36.8 C)] 98.3 F (36.8 C) (07/10 1333) Pulse Rate:  [83-116] 98  (07/10 1650) Resp:  [18-20] 18  (07/10 1650) BP: (99-117)/(64-78) 117/78 mmHg (07/10 1650) SpO2:  [96 %-98 %] 97 % (07/10 1650) Weight:  [169.373 kg (373 lb 6.4 oz)] 169.373 kg (373 lb 6.4 oz) (07/10 0601)  Intake/Output from previous day: 07/09 0701 - 07/10 0700 In: 220 [P.O.:220] Out: 1650 [Urine:1650] Intake/Output from this shift: Total I/O In: 220 [P.O.:220] Out: 1100 [Urine:1100]  Physical Exam: Neck: no adenopathy, no carotid bruit, no JVD and supple, symmetrical, trachea midline Lungs: clear to auscultation bilaterally Heart: irregularly irregular rhythm, S1, S2 normal and Soft systolic murmur noted no S3 gallop Abdomen: soft, non-tender; bowel sounds normal; no masses,  no organomegaly Extremities: extremities normal, atraumatic, no cyanosis or edema  Lab Results:  Basename 02/28/12 0500 02/27/12 0304  WBC 8.8 10.2  HGB 13.1 12.8  PLT 204 197    Basename 02/28/12 0500 02/27/12 1726  NA 135 128*  K 4.3 4.7  CL 97 92*  CO2 30 26  GLUCOSE 357* 546*  BUN 16 18  CREATININE 1.52* 1.64*    Basename 02/27/12 0844 02/27/12 0304  TROPONINI <0.30 <0.30   Hepatic Function Panel  Basename 02/26/12 2112  PROT 8.3  ALBUMIN 3.2*  AST 15  ALT 12  ALKPHOS 113  BILITOT 0.3  BILIDIR --  IBILI --    Basename 02/27/12 0304  CHOL 107   No results found for this basename: PROTIME in the last 72 hours  Imaging: Imaging results have been reviewed and Nm Myocar Multi W/spect W/wall Motion / Ef  02/28/2012  Clinical Data:  Chest pain.  Technique:  Standard myocardial SPECT imaging performed after resting  intravenous injection of Tc-49m tetrofosmin.  Subsequently, stress in the form of Lexiscan  was administered under the supervision of the Cardiology staff.  At peak stress, Tc-24m tetrofosmin was injected intravenously and standard myocardial SPECT imaging performed.  Quantitative gated imaging also performed to evaluate left ventricular wall motion and estimate left ventricular ejection fraction.  Radiopharmaceutical: Tc-12m tetrofosmin, 30 mCi at rest and 30 mCi at stress.  Comparison:  None  MYOCARDIAL IMAGING WITH SPECT (REST AND STRESS)  Findings:  Breast attenuation and diaphragmatic attenuation noted. Matched defect in the anteroapical segment raises the possibility of scar.  In the anterior wall base, there is a truncated appearance on the stress images with concentrated activity along the blunted margin suspicious for artifact but possibly representing a region of inducible ischemia.  LEFT VENTRICULAR EJECTION FRACTION  Findings:  Left ventricular end-diastolic volume is 72 cc.  End- systolic volume is 38 cc.  Derived LV ejection fraction is 47%.  GATED LEFT VENTRICULAR WALL MOTION STUDY  Findings:  There is mild hypokinesis and poor wall thickening particularly at the cardiac apex, and to a lesser extent along the basilar portions of the septum and lateral wall.  IMPRESSION:  1.  Truncated appearance of the base of the anterior wall on stress images, with concentration of activity in the adjacent mid cardiac segment raising suspicion for artifact.  However, this could be due to inducible ischemia - careful correlation with ECG portion of the exam is recommended. 2.  Breast attenuation and diaphragmatic attenuation. 3.  Matched defect in the anteroapical segment raises the possibility of scar. 4.  Mild hypokinesis and poor wall thickening in both at the cardiac apex and along the septal and lateral sides the cardiac base.  Original Report Authenticated By: Carron Curie, M.D.    Cardiac  Studies:  Assessment/Plan:  Status post Recurrent chest pain MI ruled out  Uncontrolled diabetes mellitus  Hypertension  Obstructive sleep apnea  Chronic A. fib with moderate ventricular response  Mild decompensated systolic heart failure  Morbid obesity  Chronic kidney disease  History of DVT  Plan Increase Lantus insulin as per orders Change IV digoxin to by mouth Check labs in a.m.  LOS: 2 days    [name] N 02/28/2012, 6:28 PM

## 2012-02-28 NOTE — Progress Notes (Signed)
ANTICOAGULATION CONSULT NOTE - [place] for Coumadin Indication: atrial fibrillation   Allergies  Allergen Reactions  . Sulfa Antibiotics Itching   Labs:  Basename 02/28/12 0500 02/27/12 1726 02/27/12 0844 02/27/12 0304 02/26/12 2112  HGB 13.1 -- -- 12.8 --  HCT 39.6 -- -- 38.4 40.4  PLT 204 -- -- 197 196  APTT -- -- -- -- 32  LABPROT 31.9* -- -- 28.6* 28.8*  INR 3.03* -- -- 2.64* 2.66*  HEPARINUNFRC -- -- -- -- --  CREATININE 1.52* 1.64* -- 1.51* --  CKTOTAL -- -- 118 116 128  CKMB -- -- 1.1 1.1 0.9  TROPONINI -- -- <0.30 <0.30 <0.30    Estimated Creatinine Clearance: 69.1 ml/min (by C-G formula based on Cr of 1.52).  Assessment: 33 YOF on chronic Coumadin for Afib admitted 02/26/12 with chest pain.  Patient also has hx of diastolic heart failure.   INR today is 3.03  Goal of Therapy:  INR 2-3   Plan:  1. Hold coumadin X 1 day. 2. INR in AM    Tad Moore 02/28/2012,9:14 AM

## 2012-02-29 LAB — BASIC METABOLIC PANEL
BUN: 14 mg/dL (ref 6–23)
CO2: 28 mEq/L (ref 19–32)
Calcium: 9.3 mg/dL (ref 8.4–10.5)
Chloride: 97 mEq/L (ref 96–112)
Creatinine, Ser: 1.47 mg/dL — ABNORMAL HIGH (ref 0.50–1.10)
GFR calc Af Amer: 45 mL/min — ABNORMAL LOW (ref >90)
GFR calc non Af Amer: 39 mL/min — ABNORMAL LOW (ref >90)
Glucose, Bld: 297 mg/dL — ABNORMAL HIGH (ref 70–99)
Potassium: 3.8 mEq/L (ref 3.5–5.1)
Sodium: 135 mEq/L (ref 135–145)

## 2012-02-29 LAB — GLUCOSE, CAPILLARY
Glucose-Capillary: 263 mg/dL — ABNORMAL HIGH (ref 70–99)
Glucose-Capillary: 419 mg/dL — ABNORMAL HIGH (ref 70–99)

## 2012-02-29 LAB — HEMOGLOBIN A1C
Hgb A1c MFr Bld: 14.9 % — ABNORMAL HIGH (ref <5.7)
Mean Plasma Glucose: 381 mg/dL — ABNORMAL HIGH (ref <117)

## 2012-02-29 LAB — PROTIME-INR
INR: 2.88 — ABNORMAL HIGH (ref 0.00–1.49)
Prothrombin Time: 30.6 seconds — ABNORMAL HIGH (ref 11.6–15.2)

## 2012-02-29 MED ORDER — INSULIN GLARGINE 100 UNIT/ML ~~LOC~~ SOLN: 30.0000 [IU] | Freq: Two times a day (BID) | SUBCUTANEOUS | Status: DC

## 2012-02-29 MED ORDER — ASPIRIN 81 MG PO TBEC: 81.0000 mg | DELAYED_RELEASE_TABLET | Freq: Every day | ORAL | Status: AC

## 2012-02-29 MED ORDER — WARFARIN SODIUM 2 MG PO TABS
2.0000 mg | ORAL_TABLET | Freq: Once | ORAL | Status: DC
  Filled 2012-02-29: qty 1

## 2012-02-29 MED ORDER — DIGOXIN 250 MCG PO TABS: 0.2500 mg | ORAL_TABLET | Freq: Every day | ORAL | Status: DC

## 2012-02-29 NOTE — Discharge Summary (Signed)
  Discharge summary dictated on 02/29/2012 dictation number is [phone]

## 2012-02-29 NOTE — Progress Notes (Signed)
Patient's tele and IV has been discontinued, discharge instructions has been reviewed with patient and patient verbalizes understanding of discharge instructions__________________________________________________________________D. Owens Shark RN

## 2012-02-29 NOTE — Progress Notes (Addendum)
ANTICOAGULATION CONSULT NOTE - [place] for Coumadin Indication: atrial fibrillation   Allergies  Allergen Reactions  . Sulfa Antibiotics Itching   Labs:  Basename 02/29/12 0557 02/28/12 0500 02/27/12 1726 02/27/12 0844 02/27/12 0304 02/26/12 2112  HGB -- 13.1 -- -- 12.8 --  HCT -- 39.6 -- -- 38.4 40.4  PLT -- 204 -- -- 197 196  APTT -- -- -- -- -- 32  LABPROT 30.6* 31.9* -- -- 28.6* --  INR 2.88* 3.03* -- -- 2.64* --  HEPARINUNFRC -- -- -- -- -- --  CREATININE 1.47* 1.52* 1.64* -- -- --  CKTOTAL -- -- -- 118 116 128  CKMB -- -- -- 1.1 1.1 0.9  TROPONINI -- -- -- <0.30 <0.30 <0.30    Estimated Creatinine Clearance: 71.3 ml/min (by C-G formula based on Cr of 1.47).  Assessment: 34 YOF on chronic Coumadin for Afib admitted 02/26/12 with chest pain.  Patient also has hx of diastolic heart failure.   Anticoag: INR today is 2.88. CBC wnl. No bleeding noted  CHF: Pt started on digoxin on 7/9 for exacerbation of HF.   Endocrine: Pt is currently on sliding scale insulin for DM. DM is uncontrolled with CBG in range of 250-550 range. DM coordinator recommends insulin drip.    Goal of Therapy:  INR 2-3   Plan:  1. Resume coumadin 2mg  x 1 2. INR in AM   3. Consider checking dig level in 1-2 days.  4. Consider starting insulin drip for DM control   [employer], Pharm.D. Clinical Pharmacist   Pager: [phone] Phone: [phone] 02/29/2012 10:21 AM   Co-signed by: Salome Arnt, PharmD, BCPS Pager # [phone] 02/29/2012 10:21 AM

## 2012-03-01 MED FILL — Regadenoson IV Inj 0.4 MG/5ML (0.08 MG/ML): INTRAVENOUS | Qty: 5 | Status: AC

## 2012-03-01 NOTE — Discharge Summary (Signed)
[name], [name]        ACCOUNT NO.:  [id number]  MEDICAL RECORD NO.:  [id number]  LOCATION:  4729                         FACILITY:  [facility]  PHYSICIAN:  Allegra Lai. Terrence Dupont, M.D. DATE OF BIRTH:  07/25/1956  DATE OF ADMISSION:  02/26/2012 DATE OF DISCHARGE:  02/29/2012                              DISCHARGE SUMMARY   ADMITTING DIAGNOSES: 1. Recurrent chest pain, rule out myocardial infarction. 2. Uncontrolled diabetes mellitus. 3. Hypercholesteremia. 4. Obstructive sleep apnea. 5. Chronic atrial fibrillation. 6. Mild decompensated systolic heart failure. 7. Morbid obesity. 8. Chronic kidney disease. 9. History of deep vein thrombosis in the past.  DISCHARGE DIAGNOSES: 1. Status post chest pain, myocardial infarction ruled out, negative     nuclear stress test. 2. Insulin-requiring diabetes mellitus. 3. Hypertension. 4. Obstructive sleep apnea. 5. Chronic atrial fibrillation. 6. Compensated systolic heart failure. 7. Morbid obesity. 8. Chronic kidney disease. 9. History of deep vein thrombosis.  DISCHARGE HOME MEDICATIONS: 1. Enteric-coated aspirin 81 mg 1 tablet daily. 2. Digoxin 0.25 mg 1 tablet daily. 3. Lantus insulin 30 units twice daily and adjust the dose accordingly     as discussed. 4. Carvedilol 12.5 mg 1 tablet twice daily. 5. Tranxene 15 mg twice daily as before. 6. Crestor 10 mg 1 tab daily. 7. Lasix 40 mg 1 tab daily. 8. Gabapentin 300 mg at bedtime. 9. NovoLog insulin sliding scale as directed as before. 10.Prilosec 40 mg daily. 11.Patanol ophthalmic solution 1 drop both eyes as before. 12.K-Dur 20 mEq twice daily. 13.Aldactone 125 mg daily. 14.Warfarin 2 mg 6 days a week. 15.Coumadin on Sundays as before. 16.The patient has been advised to stop Januvia.  DIET:  Low salt, low cholesterol, 1800-calorie, ADA diet.  INSTRUCTIONS:  The patient has been advised to monitor blood sugar 4 times daily and chart.  Follow up with me in 1 week.  Follow  up with Dr. Montez Morita as scheduled.  CONDITION AT DISCHARGE:  Stable.  BRIEF HISTORY:  [name] is a 56 year old female with past medical history significant for hypertension, insulin-requiring diabetes mellitus, history of congestive heart failure secondary to systolic dysfunction, hypercholesteremia, morbid obesity, obstructive sleep apnea, chronic kidney disease stage 3.  She came to the ER complaining of recurrent retrosternal chest pain off and on for last 2-3 days, described as someone sitting on the chest, which radiates to the left arm and left side of the head, associated with nausea and mild shortness of breath.  The patient also gives history of exertional dyspnea and leg swelling.  She states her blood sugar has been running between 400-500 lately.  Denies any cough, fever, or chills.  Denies any urinary complaints.  The patient was seen by PMD recently and was put on sliding scale insulin, without much improvement in her blood sugar.  PAST MEDICAL HISTORY:  As above.  PAST SURGICAL HISTORY:  She had abdominal hysterectomy in the past.  SOCIAL HISTORY:  She is single.  No history of smoking or alcohol abuse.  ALLERGIES:  She is allergic to SULFA, ANTIBIOTICS.  PHYSICAL EXAMINATION:  VITAL SIGNS:  Blood pressure was 125/81, pulse was 64 and irregularly irregular.  She was afebrile. HEENT:  Conjunctiva was pink. NECK:  Supple.  No JVD. LUNGS:  Clear to auscultation without rhonchi or rales. CARDIOVASCULAR:  Irregularly irregular.  S1 and S2 was soft.  There was soft systolic murmur and a faint S3 gallop noted. ABDOMEN: Soft.  Bowel sounds are present.  Mildly distended.  No guarding or tenderness. EXTREMITIES:  There is no clubbing, cyanosis, or edema.  LABORATORY DATA:  Sodium was 133, potassium 4.6, BUN 16, creatinine 1.65.  Her BNP was 299.  Repeat BNP was 301, repeat BNP was 120.  Three sets of cardiac enzymes were negative.  Repeat electrolytes today, sodium  is 135, potassium 3.8, BUN 14, creatinine 1.47, which has been stable.  Cholesterol was 107, LDL 54, HDL was low at 36, triglycerides were 87.  Hemoglobin was 14.3, hematocrit 41.8, white count of 9.1.  Urinalysis was negative.  EKG showed atrial fibrillation with moderate ventricular response, poor R-wave progression in anteroseptal leads, nondiagnostic Q-waves in inferior leads.  No change from prior EKG.  Her nuclear stress test showed no definite ischemia.  There was mild hypokinesia and poor wall thickening in both the apex and along the septal and lateral sides of the cardiac base.  Ejection fraction actually has improved to 47%.  BRIEF HOSPITAL COURSE:  The patient was admitted to Telemetry unit.  MI was ruled out by serial enzymes and EKG.  The patient did not have any episodes of anginal chest pain during the hospital stay.  The patient subsequently underwent nuclear stress test which showed no evidence of definite ischemia with EF of 47%.  The patient had episodes of AFib with RVR requiring IV digoxin with fairly better control of heart rate.  Her blood sugar has been elevated, staying above 400.  Lantus insulin dose has been increased to 30 units twice daily.  The patient has been advised to monitor blood sugar 4 times daily and chart and adjust the dose of insulin accordingly.  Her blood sugar this morning is in 250s. The patient will be discharged home on above medications and will be followed up by me in 1 week and Dr. Montez Morita as scheduled.     Allegra Lai. Terrence Dupont, M.D.     MNH/MEDQ  D:  02/29/2012  T:  02/29/2012  Job:  [id number]

## 2012-03-05 ENCOUNTER — Ambulatory Visit: Payer: Medicare Other | Admitting: Pulmonary Disease

## 2012-04-18 ENCOUNTER — Encounter: Payer: Self-pay | Admitting: *Deleted

## 2012-04-18 ENCOUNTER — Encounter: Payer: Medicare Other | Attending: Cardiology | Admitting: *Deleted

## 2012-04-18 ENCOUNTER — Ambulatory Visit: Payer: Medicare Other | Admitting: Pulmonary Disease

## 2012-04-18 VITALS — Ht 65.5 in | Wt 374.7 lb

## 2012-04-18 DIAGNOSIS — Z713 Dietary counseling and surveillance: Secondary | ICD-10-CM | POA: Insufficient documentation

## 2012-04-18 DIAGNOSIS — E119 Type 2 diabetes mellitus without complications: Secondary | ICD-10-CM

## 2012-04-18 NOTE — Progress Notes (Signed)
  Medical Nutrition Therapy:  Appt start time: 0800 end time:  0900.  Assessment:  Primary concerns today: patient here with her sister who appears very supportive. They live together along with patient's husband, her daughter and 3 grand children, ages 23-12. The sister and patient's husband both shop and prepare the meals. The patient states she was working with a physical therapist; Ronalee Belts, who was very supportive and motivated her to increase her activity level. Ronalee Belts is no longer coming to see her and her activity is very low now. She states she has had diabetes for about 3 years and has not ever received any diabetes education until today.   MEDICATIONS: see list. Diabetes medication includes Lantus pen 30 units in AM and 35 units in PM and Novolog pen based on sliding scale every 6 hours which she said usually corresponds to meal times.   DIETARY INTAKE:  Usual eating pattern includes 3 meals and 0-2 snacks per day.  Everyday foods include good variety of all food groups.  Avoided foods include hot coffee or tea.    24-hr recall:  B ( AM):11/2 scoop grits, 2 eggs, 1 sausage or 2 bacon, 16 oz OJ or water Snk ( AM): 6 PNB crackers OR fresh fruit  L ( PM): left overs OR sandwich with fruit occasionally, 12 oz can regular or diet soda or water Snk ( PM): rarely D ( PM): meat, starch, vegetable meal OR sandwich, soda or water Snk ( PM): rarely OR frozen fruit bar OR canned cup of fruit Beverages: water, OJ, regular or diet soda, sweet tea with lemon  Usual physical activity: not now. She was being seen by a physical therapist who was motivating her to be more active including walking and riding a bicycle.  Estimated energy needs: 1500 calories 170 g carbohydrates 112 g protein 42 g fat  Progress Towards Goal(s):  In progress.   Nutritional Diagnosis:  NB-1.1 Food and nutrition-related knowledge deficit As related to diabetes.  As evidenced by A1c of 14.9%.    Intervention:  Nutrition  counseling and diabetes education initiated. Carb Counting and reading food labels introduced today using food models and designation of carb content based on each food group. Plan to discuss more about basic physiology of diabetes, SMBG and rationale of checking BG at alternate times of day, A1c, and benefits of increased activity at next visit in 5 weeks  Handouts given during visit include: Living Well with Diabetes Carb Counting and Food Label handouts Menu Planner Sheet  Monitoring/Evaluation:  Dietary intake, exercise, reading food labels, recording food on Menu Planner and body weight in 5 week(s).

## 2012-04-18 NOTE — Patient Instructions (Signed)
Plan: Aim for  Aim for Consider  Consider Continue continue

## 2012-05-15 ENCOUNTER — Other Ambulatory Visit: Payer: Self-pay | Admitting: *Deleted

## 2012-05-15 NOTE — Telephone Encounter (Signed)
Opened in Error.

## 2012-05-20 ENCOUNTER — Ambulatory Visit: Payer: Medicare Other | Admitting: *Deleted

## 2012-05-21 ENCOUNTER — Ambulatory Visit: Payer: Medicare Other | Admitting: *Deleted

## 2012-06-10 ENCOUNTER — Ambulatory Visit: Payer: Medicare Other | Admitting: *Deleted

## 2012-06-24 ENCOUNTER — Ambulatory Visit (INDEPENDENT_AMBULATORY_CARE_PROVIDER_SITE_OTHER): Payer: Medicare Other | Admitting: Internal Medicine

## 2012-06-24 ENCOUNTER — Encounter: Payer: Self-pay | Admitting: Internal Medicine

## 2012-06-24 VITALS — BP 108/68 | HR 94 | Ht 64.0 in | Wt 387.0 lb

## 2012-06-24 DIAGNOSIS — G4733 Obstructive sleep apnea (adult) (pediatric): Secondary | ICD-10-CM

## 2012-06-24 DIAGNOSIS — I4891 Unspecified atrial fibrillation: Secondary | ICD-10-CM

## 2012-06-24 NOTE — Assessment & Plan Note (Signed)
Morbid obesity significantly hinders out ability to optimize her health care.  Her obesity is directly linked to sleep apnea and afib.  In addition, she has diabetes.  I have strongly encouraged weight loss.  I will refer her to the bariatric clinic at [facility] for consideration of weight reduction surgery.

## 2012-06-24 NOTE — Patient Instructions (Addendum)
Your physician recommends that you schedule a follow-up appointment in: 2 months with Dr Rayann Heman  Be compliant with getting blood checked for Coumadin(Warfarin)  You have been referred to [facility] for [facility]  Medication to look up is [facility] has requested that you have an echocardiogram. Echocardiography is a painless test that uses sound waves to create images of your heart. It provides your doctor with information about the size and shape of your heart and how well your heart's chambers and valves are working. This procedure takes approximately one hour. There are no restrictions for this procedure.

## 2012-06-24 NOTE — Assessment & Plan Note (Signed)
Compliance with CPAP is encouraged

## 2012-06-24 NOTE — Assessment & Plan Note (Signed)
The patient has longstanding persistent atrial fibrillation.  She has never been cardioverted.  Though I think that she would do better in sinus rhythm, I am not optimistic that we can achieve or maintain sinus rhythm due to her morbid obesity and longstanding afib.  The importance of weight reduction was stressed today.  In addition, compliance with CPAP was also encouraged.  Continue current rate control stategy at this time. Obtain an echo to evaluate LA size and further evaluate for structural changes. We could consider tikosyn though I not convinced that she would be a candidate due to noncompliance in the past.  She is not a candidate for catheter ablation at this point.  More aggressive rate control may ultimately improve her symptoms though Dr Montez Morita recently decreased her coreg due to hypotension.  The importance of compliance with INR checks was discussed at length today.  We also discussed xarelto as an alternative to coumadin.  She will contemplate this option and let me know if she decides to change.

## 2012-06-24 NOTE — Progress Notes (Signed)
Primary Care Physician: Patricia Nettle, MD Referring Physician:  Dr Genevie Ann Acquanetta [name] is a 56 y.o. female with a h/o longstanding persistent atrial fibrillation who presents today for EP consultation.  She reports that she develops symptoms of nausea and diaphoresis while working at [facility] hospital 6 years ago.  She was found to have atrial fibrillation at that time.  She has been treated with rate control since.  She has not been previously cardioverted.  She thinks that she has been in afib since that time.  She reports that with moderate activity she develops tachypalpitations and SOB.  She uses a motorized scooter frequently when shopping.  She has occasional presyncope but has not had syncope.  She has occasional chest pain.  She was recently evaluated by Dr Terrence Dupont and had a low risk stress test.  She is chronically anticoagulated with coumadin but is not compliant with regular INR checks.   Past Medical History  Diagnosis Date  . HTN (hypertension)   . Chronic diastolic CHF (congestive heart failure)   . DM (diabetes mellitus)   . Hyperlipidemia   . Allergic rhinitis   . Sleep apnea     compliant with CPAP  . COPD (chronic obstructive pulmonary disease)   . GERD (gastroesophageal reflux disease)   . Headache   . Arthritis   . Depression   . Obesity   . DVT (deep vein thrombosis) in pregnancy    Past Surgical History  Procedure Date  . Abdominal hysterectomy   . Diagnostic laparoscopy     Current Outpatient Prescriptions  Medication Sig Dispense Refill  . Alogliptin-Metformin HCl (KAZANO) 12.5-500 MG TABS Take by mouth daily.      Marland Kitchen aspirin EC 81 MG EC tablet Take 1 tablet (81 mg total) by mouth daily.  30 tablet  3  . carvedilol (COREG) 12.5 MG tablet Take 12.5 mg by mouth daily.       . clorazepate (TRANXENE) 15 MG tablet Take 15 mg by mouth Twice daily.       . CRESTOR 10 MG tablet Take 10 mg by mouth daily.       . digoxin (LANOXIN) 0.25 MG tablet  Take 1 tablet (0.25 mg total) by mouth daily.  30 tablet  3  . furosemide (LASIX) 40 MG tablet Take 20 mg by mouth daily.       Marland Kitchen gabapentin (NEURONTIN) 300 MG capsule Take 300 mg by mouth at bedtime.       Marland Kitchen HYDROcodone-acetaminophen (NORCO/VICODIN) 5-325 MG per tablet as needed.      . insulin aspart (NOVOLOG) 100 UNIT/ML injection Inject 5-12 Units into the skin every 6 (six) hours. 200-300 takes 5 units, 301-400 takes 7 units, 401-500 takes 12 units.       . insulin glargine (LANTUS) 100 UNIT/ML injection Inject 30 Units into the skin as directed.      Marland Kitchen JANUVIA 50 MG tablet Take 1 tablet by mouth daily.      Marland Kitchen losartan (COZAAR) 50 MG tablet Take 1 tablet by mouth daily.      Marland Kitchen omeprazole (PRILOSEC) 40 MG capsule Take 40 mg by mouth daily.       Marland Kitchen PATANOL 0.1 % ophthalmic solution Place 1 drop into both eyes as directed.       . potassium chloride SA (K-DUR,KLOR-CON) 20 MEQ tablet Take 20 mEq by mouth Twice daily.       Marland Kitchen PROAIR HFA 108 (90 BASE) MCG/ACT inhaler as needed.      Marland Kitchen  spironolactone (ALDACTONE) 25 MG tablet Take 12.5 mg by mouth daily.       Marland Kitchen warfarin (COUMADIN) 2 MG tablet Take 2 mg by mouth See admin instructions. Takes 2 mg all days except no coumadin on sundays.      . [DISCONTINUED] insulin glargine (LANTUS) 100 UNIT/ML injection Inject 30 Units into the skin 2 (two) times daily.  10 mL  3    Allergies  Allergen Reactions  . Sulfa Antibiotics Itching    History   Social History  . Marital Status: Married    Spouse Name: N/A    Number of Children: N/A  . Years of Education: N/A   Occupational History  . Not on file.   Social History Main Topics  . Smoking status: Never Smoker   . Smokeless tobacco: Not on file  . Alcohol Use: No  . Drug Use: No  . Sexually Active: Not on file   Other Topics Concern  . Not on file   Social History Narrative   Pt lives in [place] with spouse.  2 grown children.Previously worked in [employer] at [employer].  Now on  disability    Family History  Problem Relation Age of Onset  . CAD      ROS- All systems are reviewed and negative except as per the HPI above  Physical Exam: Filed Vitals:   06/24/12 1155  BP: 108/68  Pulse: 94  Height: 5\' 4"  (1.626 m)  Weight: 387 lb (175.542 kg)  SpO2: 95%    GEN- The patient is  Morbidly obese appearing, alert and oriented x 3 today.   Head- normocephalic, atraumatic Eyes-  Sclera clear, conjunctiva pink Ears- hearing intact Oropharynx- clear Neck- supple,   Lungs- Clear to ausculation bilaterally, normal work of breathing Heart- irregular rate and rhythm, no murmurs, rubs or gallops, PMI not laterally displaced GI- soft, NT, ND, + BS Extremities- no clubbing, cyanosis, + edema MS- no significant deformity or atrophy, walks very slowly, unable to maneuver onto the exam table today due to her obesity Skin- no rash or lesion Psych- euthymic mood, full affect Neuro- strength and sensation are intact  EKG today reveals afib,  Vrate 94 bpm, LPHB,  nonspecific St/T changes  Assessment and Plan:

## 2012-07-02 ENCOUNTER — Other Ambulatory Visit (HOSPITAL_COMMUNITY): Payer: Medicare Other

## 2012-07-03 ENCOUNTER — Ambulatory Visit (HOSPITAL_COMMUNITY): Payer: Medicare Other | Attending: Internal Medicine

## 2012-07-03 DIAGNOSIS — I4891 Unspecified atrial fibrillation: Secondary | ICD-10-CM

## 2012-07-03 DIAGNOSIS — I10 Essential (primary) hypertension: Secondary | ICD-10-CM | POA: Insufficient documentation

## 2012-07-03 DIAGNOSIS — I517 Cardiomegaly: Secondary | ICD-10-CM | POA: Insufficient documentation

## 2012-07-03 DIAGNOSIS — G4733 Obstructive sleep apnea (adult) (pediatric): Secondary | ICD-10-CM | POA: Insufficient documentation

## 2012-07-03 DIAGNOSIS — I079 Rheumatic tricuspid valve disease, unspecified: Secondary | ICD-10-CM | POA: Insufficient documentation

## 2012-07-03 DIAGNOSIS — R079 Chest pain, unspecified: Secondary | ICD-10-CM | POA: Insufficient documentation

## 2012-07-03 DIAGNOSIS — Z86718 Personal history of other venous thrombosis and embolism: Secondary | ICD-10-CM | POA: Insufficient documentation

## 2012-07-03 DIAGNOSIS — E119 Type 2 diabetes mellitus without complications: Secondary | ICD-10-CM | POA: Insufficient documentation

## 2012-07-03 DIAGNOSIS — I5032 Chronic diastolic (congestive) heart failure: Secondary | ICD-10-CM | POA: Insufficient documentation

## 2012-07-03 DIAGNOSIS — R55 Syncope and collapse: Secondary | ICD-10-CM | POA: Insufficient documentation

## 2012-07-03 NOTE — Progress Notes (Signed)
Echocardiogram performed.  

## 2012-08-06 ENCOUNTER — Other Ambulatory Visit: Payer: Self-pay | Admitting: Orthopaedic Surgery

## 2012-08-06 DIAGNOSIS — M545 Low back pain, unspecified: Secondary | ICD-10-CM

## 2012-08-10 ENCOUNTER — Other Ambulatory Visit: Payer: Medicare Other

## 2012-08-17 ENCOUNTER — Ambulatory Visit
Admission: RE | Admit: 2012-08-17 | Discharge: 2012-08-17 | Disposition: A | Discharge status: Home / Self Care | Payer: Medicare Other | Source: Ambulatory Visit | Attending: Orthopaedic Surgery | Admitting: Orthopaedic Surgery

## 2012-08-17 DIAGNOSIS — M545 Low back pain, unspecified: Secondary | ICD-10-CM

## 2012-09-02 ENCOUNTER — Ambulatory Visit: Payer: Medicare Other | Attending: Cardiology | Admitting: Rehabilitative and Restorative Service Providers"

## 2012-09-02 DIAGNOSIS — IMO0001 Reserved for inherently not codable concepts without codable children: Secondary | ICD-10-CM | POA: Insufficient documentation

## 2012-09-02 DIAGNOSIS — M6281 Muscle weakness (generalized): Secondary | ICD-10-CM | POA: Insufficient documentation

## 2012-09-02 DIAGNOSIS — M545 Low back pain, unspecified: Secondary | ICD-10-CM | POA: Insufficient documentation

## 2012-09-02 DIAGNOSIS — R269 Unspecified abnormalities of gait and mobility: Secondary | ICD-10-CM | POA: Insufficient documentation

## 2012-09-06 ENCOUNTER — Encounter: Payer: Self-pay | Admitting: Internal Medicine

## 2012-09-06 ENCOUNTER — Ambulatory Visit (INDEPENDENT_AMBULATORY_CARE_PROVIDER_SITE_OTHER): Payer: Medicaid Other | Admitting: Internal Medicine

## 2012-09-06 VITALS — BP 110/75 | HR 77 | Ht 64.0 in | Wt >= 6400 oz

## 2012-09-06 DIAGNOSIS — G4733 Obstructive sleep apnea (adult) (pediatric): Secondary | ICD-10-CM

## 2012-09-06 DIAGNOSIS — I4891 Unspecified atrial fibrillation: Secondary | ICD-10-CM

## 2012-09-06 NOTE — Patient Instructions (Addendum)
Your physician wants you to follow-up in: 4 months  You will receive a reminder letter in the mail two months in advance. If you don't receive a letter, please call our office to schedule the follow-up appointment.   You have been referred to Dr Hassell Done at [facility]

## 2012-09-10 ENCOUNTER — Ambulatory Visit: Payer: Medicare Other | Admitting: Rehabilitative and Restorative Service Providers"

## 2012-09-12 ENCOUNTER — Ambulatory Visit: Payer: Medicare Other | Admitting: Physical Therapy

## 2012-09-15 NOTE — Assessment & Plan Note (Signed)
Morbid obesity significantly hinders out ability to optimize her health care.  Her obesity is directly linked to sleep apnea and afib.  In addition, she has diabetes.  I have strongly encouraged weight loss.  I will refer her to the bariatric clinic at [facility] for consideration of weight reduction surgery.  She reports willingness to comply with this referral at this time.

## 2012-09-15 NOTE — Assessment & Plan Note (Signed)
Compliance with CPAP is encouraged

## 2012-09-15 NOTE — Assessment & Plan Note (Signed)
She has significant TR and biatrial enlargement likely due to longstanding persistent afib.  Her afib is difficulty to control, particularly in the setting of noncompliance, sleep apnea, and extreme obesity. We will continue our current strategy at this time.  I have offered a novel anticoagulant as an alternative to coumadin and she declines.  I have been very clear that she needs to have her INRs followed very closely by Dr Montez Morita.

## 2012-09-15 NOTE — Progress Notes (Signed)
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   She has afib for about 6 years.  She has been treated with rate control since.  She has not been previously cardioverted.   She reports that with moderate activity she develops tachypalpitations and SOB.  She uses a motorized scooter frequently when shopping.   She has extreme obesity with multiple musculoskeletal complaints which are her primary limiting issues.  She has atypical and likely musculoskeletal chest pain.  She was recently evaluated by Dr Terrence Dupont and had a low risk stress test.  She is chronically anticoagulated with coumadin but is not compliant with regular INR checks.  Upon recently being seen by me, I adjusted rate control and reinforced the importance of compliance with INR checks.  She reports some improvement in palpitations with better rate control.   Past Medical History  Diagnosis Date  . HTN (hypertension)   . Chronic diastolic CHF (congestive heart failure)   . DM (diabetes mellitus)   . Hyperlipidemia   . Allergic rhinitis   . Sleep apnea     compliant with CPAP  . COPD (chronic obstructive pulmonary disease)   . GERD (gastroesophageal reflux disease)   . Headache   . Arthritis   . Depression   . Obesity   . DVT (deep vein thrombosis) in pregnancy    Past Surgical History  Procedure Date  . Abdominal hysterectomy   . Diagnostic laparoscopy     Current Outpatient Prescriptions  Medication Sig Dispense Refill  . Alogliptin-Metformin HCl (KAZANO) 12.5-500 MG TABS Take by mouth daily.      . carvedilol (COREG) 12.5 MG tablet Take 12.5 mg by mouth daily.       . clorazepate (TRANXENE) 15 MG tablet Take 15 mg by mouth Twice daily.       . CRESTOR 10 MG tablet Take 10 mg by mouth daily.       . digoxin (LANOXIN) 0.25 MG tablet Take 1 tablet  (0.25 mg total) by mouth daily.  30 tablet  3  . furosemide (LASIX) 40 MG tablet Take 20 mg by mouth daily.       Marland Kitchen gabapentin (NEURONTIN) 300 MG capsule Take 300 mg by mouth at bedtime.       Marland Kitchen HYDROcodone-acetaminophen (NORCO/VICODIN) 5-325 MG per tablet as needed.      . insulin aspart (NOVOLOG) 100 UNIT/ML injection Inject 5-12 Units into the skin every 6 (six) hours. 200-300 takes 5 units, 301-400 takes 7 units, 401-500 takes 12 units.       . insulin glargine (LANTUS) 100 UNIT/ML injection Inject 40 Units into the skin as directed.       Marland Kitchen JANUVIA 50 MG tablet Take 1 tablet by mouth daily.      Marland Kitchen losartan (COZAAR) 50 MG tablet Take 1 tablet by mouth daily.      Marland Kitchen omeprazole (PRILOSEC) 40 MG capsule Take 40 mg by mouth daily.       Marland Kitchen PATANOL 0.1 % ophthalmic solution Place 1 drop into both eyes as directed.       . potassium chloride SA (K-DUR,KLOR-CON) 20 MEQ tablet Take 20 mEq by mouth Twice daily.       Marland Kitchen PROAIR HFA 108 (90 BASE) MCG/ACT inhaler as needed.      Marland Kitchen spironolactone (ALDACTONE) 25 MG tablet Take 12.5 mg  by mouth daily.       Marland Kitchen warfarin (COUMADIN) 2 MG tablet Take 2 mg by mouth See admin instructions. Takes 2 mg all days except no coumadin on sundays.      Marland Kitchen aspirin EC 81 MG EC tablet Take 1 tablet (81 mg total) by mouth daily.  30 tablet  3    Allergies  Allergen Reactions  . Sulfa Antibiotics Itching    History   Social History  . Marital Status: Married    Spouse Name: N/A    Number of Children: N/A  . Years of Education: N/A   Occupational History  . Not on file.   Social History Main Topics  . Smoking status: Never Smoker   . Smokeless tobacco: Not on file  . Alcohol Use: No  . Drug Use: No  . Sexually Active: Not on file   Other Topics Concern  . Not on file   Social History Narrative   Pt lives in [place] with spouse.  2 grown children.Previously worked in [employer] at [employer].  Now on disability    Family History  Problem Relation Age of  Onset  . CAD      ROS- All systems are reviewed and negative except as per the HPI above  Physical Exam: Filed Vitals:   09/06/12 1407  BP: 110/75  Pulse: 77  Height: 5\' 4"  (1.626 m)  Weight: 400 lb 1.9 oz (181.493 kg)    GEN- The patient is  Morbidly obese appearing, alert and oriented x 3 today.   Head- normocephalic, atraumatic Eyes-  Sclera clear, conjunctiva pink Ears- hearing intact Oropharynx- clear Neck- supple,   Lungs- Clear to ausculation bilaterally, normal work of breathing Heart- irregular rate and rhythm, no murmurs, rubs or gallops, PMI not laterally displaced GI- soft, NT, ND, + BS Extremities- no clubbing, cyanosis, + edema MS- no significant deformity or atrophy, walks very slowly, unable to maneuver onto the exam table today due to her obesity Skin- no rash or lesion Psych- euthymic mood, full affect Neuro- strength and sensation are intact  Echo 07/03/12- EF 60-65%, moderate TR, moderate biatrial enlargement  EKG today reveals afib,  Vrate 100 bpm, LPHB,  nonspecific St/T changes  Assessment and Plan:

## 2012-09-17 ENCOUNTER — Ambulatory Visit: Payer: Medicare Other | Admitting: Rehabilitative and Restorative Service Providers"

## 2012-09-19 ENCOUNTER — Ambulatory Visit: Payer: Medicare Other | Admitting: Physical Therapy

## 2012-09-23 ENCOUNTER — Ambulatory Visit: Payer: Medicare Other | Admitting: Rehabilitative and Restorative Service Providers"

## 2012-09-26 ENCOUNTER — Ambulatory Visit: Payer: Medicare Other | Admitting: Rehabilitative and Restorative Service Providers"

## 2012-09-30 ENCOUNTER — Ambulatory Visit: Payer: Medicare Other | Admitting: Rehabilitative and Restorative Service Providers"

## 2012-10-01 ENCOUNTER — Ambulatory Visit: Payer: Medicare Other | Attending: Cardiology | Admitting: Rehabilitative and Restorative Service Providers"

## 2012-10-01 DIAGNOSIS — IMO0001 Reserved for inherently not codable concepts without codable children: Secondary | ICD-10-CM | POA: Insufficient documentation

## 2012-10-01 DIAGNOSIS — R269 Unspecified abnormalities of gait and mobility: Secondary | ICD-10-CM | POA: Insufficient documentation

## 2012-10-01 DIAGNOSIS — M545 Low back pain, unspecified: Secondary | ICD-10-CM | POA: Insufficient documentation

## 2012-10-01 DIAGNOSIS — M6281 Muscle weakness (generalized): Secondary | ICD-10-CM | POA: Insufficient documentation

## 2013-01-06 ENCOUNTER — Ambulatory Visit: Payer: Medicare Other | Admitting: Internal Medicine

## 2013-01-20 ENCOUNTER — Emergency Department (HOSPITAL_COMMUNITY): Payer: Medicare Other

## 2013-01-20 ENCOUNTER — Encounter (HOSPITAL_COMMUNITY): Payer: Self-pay | Admitting: *Deleted

## 2013-01-20 ENCOUNTER — Observation Stay (HOSPITAL_COMMUNITY)
Admission: EM | Admit: 2013-01-20 | Discharge: 2013-01-21 | Disposition: A | Discharge status: Home / Self Care | Payer: Medicare Other | Attending: Cardiology | Admitting: Cardiology

## 2013-01-20 ENCOUNTER — Other Ambulatory Visit: Payer: Self-pay

## 2013-01-20 DIAGNOSIS — R002 Palpitations: Secondary | ICD-10-CM | POA: Insufficient documentation

## 2013-01-20 DIAGNOSIS — I5020 Unspecified systolic (congestive) heart failure: Secondary | ICD-10-CM | POA: Insufficient documentation

## 2013-01-20 DIAGNOSIS — R42 Dizziness and giddiness: Secondary | ICD-10-CM | POA: Insufficient documentation

## 2013-01-20 DIAGNOSIS — N183 Chronic kidney disease, stage 3 unspecified: Secondary | ICD-10-CM | POA: Insufficient documentation

## 2013-01-20 DIAGNOSIS — R079 Chest pain, unspecified: Principal | ICD-10-CM | POA: Insufficient documentation

## 2013-01-20 DIAGNOSIS — I509 Heart failure, unspecified: Secondary | ICD-10-CM | POA: Insufficient documentation

## 2013-01-20 DIAGNOSIS — I4891 Unspecified atrial fibrillation: Secondary | ICD-10-CM | POA: Insufficient documentation

## 2013-01-20 DIAGNOSIS — I129 Hypertensive chronic kidney disease with stage 1 through stage 4 chronic kidney disease, or unspecified chronic kidney disease: Secondary | ICD-10-CM | POA: Insufficient documentation

## 2013-01-20 DIAGNOSIS — E119 Type 2 diabetes mellitus without complications: Secondary | ICD-10-CM | POA: Insufficient documentation

## 2013-01-20 DIAGNOSIS — Z794 Long term (current) use of insulin: Secondary | ICD-10-CM | POA: Insufficient documentation

## 2013-01-20 HISTORY — DX: Cardiac arrhythmia, unspecified: I49.9

## 2013-01-20 LAB — COMPREHENSIVE METABOLIC PANEL
ALT: 10 U/L (ref 0–35)
ALT: 9 U/L (ref 0–35)
AST: 14 U/L (ref 0–37)
AST: 16 U/L (ref 0–37)
Albumin: 3.4 g/dL — ABNORMAL LOW (ref 3.5–5.2)
Albumin: 3.1 g/dL — ABNORMAL LOW (ref 3.5–5.2)
Alkaline Phosphatase: 107 U/L (ref 39–117)
Alkaline Phosphatase: 96 U/L (ref 39–117)
BUN: 14 mg/dL (ref 6–23)
BUN: 14 mg/dL (ref 6–23)
CO2: 26 mEq/L (ref 19–32)
CO2: 25 mEq/L (ref 19–32)
Calcium: 9.6 mg/dL (ref 8.4–10.5)
Calcium: 9.3 mg/dL (ref 8.4–10.5)
Chloride: 96 mEq/L (ref 96–112)
Chloride: 98 mEq/L (ref 96–112)
Creatinine, Ser: 1.37 mg/dL — ABNORMAL HIGH (ref 0.50–1.10)
Creatinine, Ser: 1.36 mg/dL — ABNORMAL HIGH (ref 0.50–1.10)
GFR calc Af Amer: 49 mL/min — ABNORMAL LOW (ref >90)
GFR calc Af Amer: 49 mL/min — ABNORMAL LOW (ref >90)
GFR calc non Af Amer: 42 mL/min — ABNORMAL LOW (ref >90)
GFR calc non Af Amer: 43 mL/min — ABNORMAL LOW (ref >90)
Glucose, Bld: 305 mg/dL — ABNORMAL HIGH (ref 70–99)
Glucose, Bld: 307 mg/dL — ABNORMAL HIGH (ref 70–99)
Potassium: 3.8 mEq/L (ref 3.5–5.1)
Potassium: 4.1 mEq/L (ref 3.5–5.1)
Sodium: 135 mEq/L (ref 135–145)
Sodium: 135 mEq/L (ref 135–145)
Total Bilirubin: 0.5 mg/dL (ref 0.3–1.2)
Total Bilirubin: 0.5 mg/dL (ref 0.3–1.2)
Total Protein: 8.8 g/dL — ABNORMAL HIGH (ref 6.0–8.3)
Total Protein: 8.2 g/dL (ref 6.0–8.3)

## 2013-01-20 LAB — HEMOGLOBIN A1C
Hgb A1c MFr Bld: 11.4 % — ABNORMAL HIGH (ref <5.7)
Mean Plasma Glucose: 280 mg/dL — ABNORMAL HIGH (ref <117)

## 2013-01-20 LAB — CBC WITH DIFFERENTIAL/PLATELET
Basophils Absolute: 0 10*3/uL (ref 0.0–0.1)
Basophils Relative: 0 % (ref 0–1)
Eosinophils Absolute: 0.2 10*3/uL (ref 0.0–0.7)
Eosinophils Relative: 2 % (ref 0–5)
HCT: 41.1 % (ref 36.0–46.0)
Hemoglobin: 13.4 g/dL (ref 12.0–15.0)
Lymphocytes Relative: 27 % (ref 12–46)
Lymphs Abs: 3.3 10*3/uL (ref 0.7–4.0)
MCH: 27.6 pg (ref 26.0–34.0)
MCHC: 32.6 g/dL (ref 30.0–36.0)
MCV: 84.6 fL (ref 78.0–100.0)
Monocytes Absolute: 0.8 10*3/uL (ref 0.1–1.0)
Monocytes Relative: 7 % (ref 3–12)
Neutro Abs: 7.9 10*3/uL — ABNORMAL HIGH (ref 1.7–7.7)
Neutrophils Relative %: 65 % (ref 43–77)
Platelets: 226 10*3/uL (ref 150–400)
RBC: 4.86 MIL/uL (ref 3.87–5.11)
RDW: 14.4 % (ref 11.5–15.5)
WBC: 12.2 10*3/uL — ABNORMAL HIGH (ref 4.0–10.5)

## 2013-01-20 LAB — MAGNESIUM: Magnesium: 1.6 mg/dL (ref 1.5–2.5)

## 2013-01-20 LAB — CBC
HCT: 42.8 % (ref 36.0–46.0)
Hemoglobin: 14.4 g/dL (ref 12.0–15.0)
MCH: 28.2 pg (ref 26.0–34.0)
MCHC: 33.6 g/dL (ref 30.0–36.0)
MCV: 83.8 fL (ref 78.0–100.0)
Platelets: 267 10*3/uL (ref 150–400)
RBC: 5.11 MIL/uL (ref 3.87–5.11)
RDW: 14.1 % (ref 11.5–15.5)
WBC: 11.7 10*3/uL — ABNORMAL HIGH (ref 4.0–10.5)

## 2013-01-20 LAB — GLUCOSE, CAPILLARY
Glucose-Capillary: 273 mg/dL — ABNORMAL HIGH (ref 70–99)
Glucose-Capillary: 382 mg/dL — ABNORMAL HIGH (ref 70–99)

## 2013-01-20 LAB — PROTIME-INR
INR: 2.48 — ABNORMAL HIGH (ref 0.00–1.49)
INR: 2.51 — ABNORMAL HIGH (ref 0.00–1.49)
Prothrombin Time: 25.7 seconds — ABNORMAL HIGH (ref 11.6–15.2)
Prothrombin Time: 25.9 seconds — ABNORMAL HIGH (ref 11.6–15.2)

## 2013-01-20 LAB — POCT I-STAT, CHEM 8
BUN: 15 mg/dL (ref 6–23)
Calcium, Ion: 1.16 mmol/L (ref 1.12–1.23)
Chloride: 101 mEq/L (ref 96–112)
Creatinine, Ser: 1.3 mg/dL — ABNORMAL HIGH (ref 0.50–1.10)
Glucose, Bld: 307 mg/dL — ABNORMAL HIGH (ref 70–99)
HCT: 47 % — ABNORMAL HIGH (ref 36.0–46.0)
Hemoglobin: 16 g/dL — ABNORMAL HIGH (ref 12.0–15.0)
Potassium: 3.9 mEq/L (ref 3.5–5.1)
Sodium: 138 mEq/L (ref 135–145)
TCO2: 28 mmol/L (ref 0–100)

## 2013-01-20 LAB — POCT I-STAT TROPONIN I: Troponin i, poc: 0 ng/mL (ref 0.00–0.08)

## 2013-01-20 LAB — TROPONIN I
Troponin I: <0.3 ng/mL (ref <0.30)
Troponin I: <0.3 ng/mL (ref <0.30)

## 2013-01-20 LAB — APTT: aPTT: 33 seconds (ref 24–37)

## 2013-01-20 LAB — PRO B NATRIURETIC PEPTIDE: Pro B Natriuretic peptide (BNP): 297 pg/mL — ABNORMAL HIGH (ref 0–125)

## 2013-01-20 LAB — TSH: TSH: 2.133 u[IU]/mL (ref 0.350–4.500)

## 2013-01-20 LAB — DIGOXIN LEVEL
Digoxin Level: 0.9 ng/mL (ref 0.8–2.0)
Digoxin Level: 0.9 ng/mL (ref 0.8–2.0)

## 2013-01-20 MED ORDER — ASPIRIN 300 MG RE SUPP: 300.0000 mg | RECTAL | Status: DC

## 2013-01-20 MED ORDER — DILTIAZEM HCL 25 MG/5ML IV SOLN: 20.0000 mg | Freq: Once | INTRAVENOUS | Status: DC

## 2013-01-20 MED ORDER — ASPIRIN 81 MG PO CHEW: 324.0000 mg | CHEWABLE_TABLET | ORAL | Status: DC

## 2013-01-20 MED ORDER — OXYCODONE-ACETAMINOPHEN 5-325 MG PO TABS
1.0000 | ORAL_TABLET | Freq: Four times a day (QID) | ORAL | Status: DC | PRN
  Administered 2013-01-20: 1 via ORAL
  Filled 2013-01-20: qty 1

## 2013-01-20 MED ORDER — POTASSIUM CHLORIDE CRYS ER 20 MEQ PO TBCR
20.0000 meq | EXTENDED_RELEASE_TABLET | Freq: Every day | ORAL | Status: DC
  Administered 2013-01-20 – 2013-01-21 (×2): 20 meq via ORAL
  Filled 2013-01-20 (×2): qty 1

## 2013-01-20 MED ORDER — ATORVASTATIN CALCIUM 40 MG PO TABS
40.0000 mg | ORAL_TABLET | Freq: Every day | ORAL | Status: DC
  Administered 2013-01-20: 40 mg via ORAL
  Filled 2013-01-20 (×2): qty 1

## 2013-01-20 MED ORDER — DILTIAZEM HCL 25 MG/5ML IV SOLN
20.0000 mg | Freq: Once | INTRAVENOUS | Status: AC
  Administered 2013-01-20: 20 mg via INTRAVENOUS
  Filled 2013-01-20: qty 5

## 2013-01-20 MED ORDER — ASPIRIN EC 81 MG PO TBEC
81.0000 mg | DELAYED_RELEASE_TABLET | Freq: Every day | ORAL | Status: DC
  Administered 2013-01-21: 81 mg via ORAL
  Filled 2013-01-20: qty 1

## 2013-01-20 MED ORDER — INSULIN ASPART 100 UNIT/ML ~~LOC~~ SOLN
0.0000 [IU] | Freq: Three times a day (TID) | SUBCUTANEOUS | Status: DC
  Administered 2013-01-20 – 2013-01-21 (×3): 5 [IU] via SUBCUTANEOUS

## 2013-01-20 MED ORDER — METFORMIN HCL 500 MG PO TABS
500.0000 mg | ORAL_TABLET | Freq: Every day | ORAL | Status: DC
  Administered 2013-01-20 – 2013-01-21 (×2): 500 mg via ORAL
  Filled 2013-01-20 (×3): qty 1

## 2013-01-20 MED ORDER — ALBUTEROL SULFATE HFA 108 (90 BASE) MCG/ACT IN AERS: 1.0000 | INHALATION_SPRAY | RESPIRATORY_TRACT | Status: DC | PRN

## 2013-01-20 MED ORDER — ALOGLIPTIN-METFORMIN HCL 12.5-500 MG PO TABS: 1.0000 | ORAL_TABLET | Freq: Every day | ORAL | Status: DC

## 2013-01-20 MED ORDER — SPIRONOLACTONE 12.5 MG HALF TABLET
12.5000 mg | ORAL_TABLET | Freq: Every day | ORAL | Status: DC
  Administered 2013-01-20 – 2013-01-21 (×2): 12.5 mg via ORAL
  Filled 2013-01-20 (×2): qty 1

## 2013-01-20 MED ORDER — FUROSEMIDE 20 MG PO TABS
20.0000 mg | ORAL_TABLET | Freq: Every day | ORAL | Status: DC
  Administered 2013-01-20 – 2013-01-21 (×2): 20 mg via ORAL
  Filled 2013-01-20 (×2): qty 1

## 2013-01-20 MED ORDER — LINAGLIPTIN 5 MG PO TABS
5.0000 mg | ORAL_TABLET | Freq: Every day | ORAL | Status: DC
  Administered 2013-01-20 – 2013-01-21 (×2): 5 mg via ORAL
  Filled 2013-01-20 (×2): qty 1

## 2013-01-20 MED ORDER — NITROGLYCERIN 0.4 MG SL SUBL: 0.4000 mg | SUBLINGUAL_TABLET | SUBLINGUAL | Status: DC | PRN

## 2013-01-20 MED ORDER — LOSARTAN POTASSIUM 50 MG PO TABS
50.0000 mg | ORAL_TABLET | Freq: Every day | ORAL | Status: DC
  Administered 2013-01-20 – 2013-01-21 (×2): 50 mg via ORAL
  Filled 2013-01-20 (×2): qty 1

## 2013-01-20 MED ORDER — PANTOPRAZOLE SODIUM 40 MG PO TBEC
40.0000 mg | DELAYED_RELEASE_TABLET | Freq: Every day | ORAL | Status: DC
  Administered 2013-01-20 – 2013-01-21 (×2): 40 mg via ORAL
  Filled 2013-01-20 (×2): qty 1

## 2013-01-20 MED ORDER — NITROGLYCERIN 0.4 MG SL SUBL
0.4000 mg | SUBLINGUAL_TABLET | SUBLINGUAL | Status: DC | PRN
  Administered 2013-01-20 (×2): 0.4 mg via SUBLINGUAL
  Filled 2013-01-20: qty 25

## 2013-01-20 MED ORDER — GABAPENTIN 300 MG PO CAPS
300.0000 mg | ORAL_CAPSULE | Freq: Every day | ORAL | Status: DC
  Administered 2013-01-20: 300 mg via ORAL
  Filled 2013-01-20 (×2): qty 1

## 2013-01-20 MED ORDER — WARFARIN - PHARMACIST DOSING INPATIENT: Freq: Every day | Status: DC

## 2013-01-20 MED ORDER — INSULIN GLARGINE 100 UNIT/ML ~~LOC~~ SOLN
40.0000 [IU] | Freq: Two times a day (BID) | SUBCUTANEOUS | Status: DC
  Administered 2013-01-20 – 2013-01-21 (×2): 40 [IU] via SUBCUTANEOUS
  Filled 2013-01-20 (×3): qty 0.4

## 2013-01-20 MED ORDER — DILTIAZEM HCL 100 MG IV SOLR: 5.0000 mg/h | INTRAVENOUS | Status: DC

## 2013-01-20 MED ORDER — SODIUM CHLORIDE 0.9 % IV SOLN
INTRAVENOUS | Status: DC
  Administered 2013-01-20: 10 mL/h via INTRAVENOUS

## 2013-01-20 MED ORDER — OLOPATADINE HCL 0.1 % OP SOLN
1.0000 [drp] | Freq: Two times a day (BID) | OPHTHALMIC | Status: DC
  Administered 2013-01-20 – 2013-01-21 (×2): 1 [drp] via OPHTHALMIC
  Filled 2013-01-20: qty 5

## 2013-01-20 MED ORDER — CARVEDILOL 12.5 MG PO TABS
12.5000 mg | ORAL_TABLET | Freq: Every day | ORAL | Status: DC
  Administered 2013-01-20 – 2013-01-21 (×2): 12.5 mg via ORAL
  Filled 2013-01-20 (×2): qty 1

## 2013-01-20 MED ORDER — ASPIRIN EC 81 MG PO TBEC: 81.0000 mg | DELAYED_RELEASE_TABLET | Freq: Every day | ORAL | Status: DC

## 2013-01-20 MED ORDER — MORPHINE SULFATE 4 MG/ML IJ SOLN
4.0000 mg | INTRAMUSCULAR | Status: DC | PRN
  Administered 2013-01-20: 4 mg via INTRAVENOUS
  Filled 2013-01-20: qty 1

## 2013-01-20 MED ORDER — DILTIAZEM HCL 100 MG IV SOLR
2.0000 mg/h | INTRAVENOUS | Status: DC
  Administered 2013-01-20: 5 mg/h via INTRAVENOUS
  Filled 2013-01-20 (×2): qty 100

## 2013-01-20 MED ORDER — ONDANSETRON HCL 4 MG/2ML IJ SOLN: 4.0000 mg | Freq: Four times a day (QID) | INTRAMUSCULAR | Status: DC | PRN

## 2013-01-20 MED ORDER — DIGOXIN 250 MCG PO TABS
0.2500 mg | ORAL_TABLET | Freq: Every day | ORAL | Status: DC
  Administered 2013-01-20 – 2013-01-21 (×2): 0.25 mg via ORAL
  Filled 2013-01-20 (×2): qty 1

## 2013-01-20 MED ORDER — WARFARIN SODIUM 2 MG PO TABS
2.0000 mg | ORAL_TABLET | ORAL | Status: DC
  Administered 2013-01-20: 2 mg via ORAL
  Filled 2013-01-20 (×2): qty 1

## 2013-01-20 MED ORDER — ACETAMINOPHEN 325 MG PO TABS: 650.0000 mg | ORAL_TABLET | ORAL | Status: DC | PRN

## 2013-01-20 NOTE — ED Notes (Signed)
Patient transported to X-ray 

## 2013-01-20 NOTE — ED Notes (Signed)
Report given to Alan, RN

## 2013-01-20 NOTE — Progress Notes (Signed)
informed MD that NM Myoview can not be done d/t PT weight

## 2013-01-20 NOTE — ED Notes (Signed)
Per EMS pt from doctor's office with c/o chest pain. Was at rheumatologist and while sitting in chair began to have stabbing chest pain to central chest. Reports shortness of breath. EKG shows A-Fib hx of same. CBG 276. BP 136/76 HR 74-140. Aspirin 324 given. No nitro given.

## 2013-01-20 NOTE — H&P (Signed)
[name] is an 57 y.o. female.   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 Rudene Anda office complaining of left-sided chest pain associated with nausea palpitations and dizziness and was noted to be in A. fib with RVR. Patient denies any exertional chest pain but complains of exertional dyspnea with minimal exertion. Denies any syncopal episode. Denies PND orthopnea but has chronic mild leg swelling. Denies cough fever chills. States she has been lately taking her medications regularly.  Past Medical History  Diagnosis Date  . HTN (hypertension)   . Chronic diastolic CHF (congestive heart failure)   . DM (diabetes mellitus)   . Hyperlipidemia   . Allergic rhinitis   . Sleep apnea     compliant with CPAP  . COPD (chronic obstructive pulmonary disease)   . GERD (gastroesophageal reflux disease)   . Headache(784.0)   . Arthritis   . Depression   . Obesity   . DVT (deep vein thrombosis) in pregnancy     Past Surgical History  Procedure Laterality Date  . Abdominal hysterectomy    . Diagnostic laparoscopy      Family History  Problem Relation Age of Onset  . CAD     Social History:  reports that she has never smoked. She does not have any smokeless tobacco history on file. She reports that she does not drink alcohol or use illicit drugs.  Allergies:  Allergies  Allergen Reactions  . Sulfa Antibiotics Itching     (Not in a hospital admission)  Results for orders  placed during the hospital encounter of 01/20/13 (from the past 48 hour(s))  PRO B NATRIURETIC PEPTIDE     Status: Abnormal   Collection Time    01/20/13 12:00 PM      Result Value Range   Pro B Natriuretic peptide (BNP) 297.0 (*) 0 - 125 pg/mL  CBC     Status: Abnormal   Collection Time    01/20/13 12:00 PM      Result Value Range   WBC 11.7 (*) 4.0 - 10.5 K/uL   RBC 5.11  3.87 - 5.11 MIL/uL   Hemoglobin 14.4  12.0 - 15.0 g/dL   HCT 42.8  36.0 - 46.0 %   MCV 83.8  78.0 - 100.0 fL   MCH 28.2  26.0 - 34.0 pg   MCHC 33.6  30.0 - 36.0 g/dL   RDW 14.1  11.5 - 15.5 %   Platelets 267  150 - 400 K/uL  COMPREHENSIVE METABOLIC PANEL     Status: Abnormal   Collection Time    01/20/13 12:00 PM      Result Value Range   Sodium 135  135 - 145 mEq/L   Potassium 3.8  3.5 - 5.1 mEq/L   Chloride 96  96 - 112 mEq/L   CO2 26  19 - 32 mEq/L   Glucose, Bld 305 (*) 70 - 99 mg/dL   BUN 14  6 - 23 mg/dL   Creatinine, Ser 1.37 (*) 0.50 - 1.10 mg/dL   Calcium 9.6  8.4 - 10.5 mg/dL   Total Protein 8.8 (*) 6.0 - 8.3 g/dL   Albumin 3.4 (*)  3.5 - 5.2 g/dL   AST 14  0 - 37 U/L   ALT 10  0 - 35 U/L   Alkaline Phosphatase 107  39 - 117 U/L   Total Bilirubin 0.5  0.3 - 1.2 mg/dL   GFR calc non Af Amer 42 (*) >90 mL/min   GFR calc Af Amer 49 (*) >90 mL/min   Comment:            The eGFR has been calculated     using the CKD EPI equation.     This calculation has not been     validated in all clinical     situations.     eGFR's persistently     <90 mL/min signify     possible Chronic Kidney Disease.  PROTIME-INR     Status: Abnormal   Collection Time    01/20/13 12:00 PM      Result Value Range   Prothrombin Time 25.7 (*) 11.6 - 15.2 seconds   INR 2.48 (*) 0.00 - 1.49  POCT I-STAT TROPONIN I     Status: None   Collection Time    01/20/13 12:22 PM      Result Value Range   Troponin i, poc 0.00  0.00 - 0.08 ng/mL   Comment 3            Comment: Due to the release kinetics of cTnI,     a  negative result within the first hours     of the onset of symptoms does not rule out     myocardial infarction with certainty.     If myocardial infarction is still suspected,     repeat the test at appropriate intervals.  POCT I-STAT, CHEM 8     Status: Abnormal   Collection Time    01/20/13 12:27 PM      Result Value Range   Sodium 138  135 - 145 mEq/L   Potassium 3.9  3.5 - 5.1 mEq/L   Chloride 101  96 - 112 mEq/L   BUN 15  6 - 23 mg/dL   Creatinine, Ser 1.30 (*) 0.50 - 1.10 mg/dL   Glucose, Bld 307 (*) 70 - 99 mg/dL   Calcium, Ion 1.16  1.12 - 1.23 mmol/L   TCO2 28  0 - 100 mmol/L   Hemoglobin 16.0 (*) 12.0 - 15.0 g/dL   HCT 47.0 (*) 36.0 - 46.0 %  DIGOXIN LEVEL     Status: None   Collection Time    01/20/13 12:40 PM      Result Value Range   Digoxin Level 0.9  0.8 - 2.0 ng/mL   Dg Chest 2 View  01/20/2013   *RADIOLOGY REPORT*  Clinical Data: Chest pain.  CHEST - 2 VIEW  Comparison: PA and lateral chest 02/26/2012.  Findings: Heart size is upper normal.  Lungs are clear.  No pneumothorax or pleural effusion.  IMPRESSION: No acute disease.   Original Report Authenticated By: Orlean Patten, M.D.    Review of Systems  Constitutional: Negative for fever, chills and weight loss.  Eyes: Negative for blurred vision and double vision.  Respiratory: Positive for shortness of breath. Negative for cough, hemoptysis and sputum production.   Cardiovascular: Positive for chest pain. Negative for orthopnea and claudication.  Gastrointestinal: Positive for nausea. Negative for heartburn, vomiting and abdominal pain.  Neurological: Positive for dizziness.    Blood pressure 104/68, pulse 56, temperature 97.6 F (36.4 C), temperature source Oral, resp. rate 20, SpO2  96.00%. Physical Exam  Constitutional: She is oriented to person, place, and time.  HENT:  Head: Normocephalic and atraumatic.  Eyes: Conjunctivae and EOM are normal. Pupils are equal, round, and reactive to light. Left  eye exhibits no discharge. No scleral icterus.  Neck: Normal range of motion. No JVD present. No tracheal deviation present. No thyromegaly present.  Cardiovascular:  Irregularly irregular S1-S2 soft there is 2/6 systolic murmur noted  Respiratory: Effort normal and breath sounds normal. No respiratory distress. She has no wheezes. She has no rales.  GI: Soft. Bowel sounds are normal. She exhibits distension. There is no tenderness. There is no rebound.  Musculoskeletal: She exhibits no edema and no tenderness.  Neurological: She is alert and oriented to person, place, and time.     Assessment/Plan Atypical chest pain rule out MI A. fib with RVR Uncontrolled diabetes mellitus Hypertension COPD Obstructive sleep apnea/obesity hypoventilation syndrome Morbid obesity Chronic kidney disease History of DVT Depression Degenerative joint disease Plan As per orders Check old records  [facility] N 01/20/2013, 3:40 PM

## 2013-01-20 NOTE — ED Provider Notes (Signed)
History     CSN: [id number] Arrival date & time 01/20/13  1154 First MD Initiated Contact with Patient 01/20/13 1156      Chief Complaint  Patient presents with  . Chest Pain   HPI   Went to office of Dr. Wonda Horner this morning for follow up of her left arm pain. Patient states she was feeling fatigued most of this morning. When she walked back to the room, got more fatigued, started to feel warmth in her upper abdomen and queezy. Became mildly short of breath. In addition, started feeling 8/10 left sided sharp chest pain that did not radiate around 11 am after she was back to the room. Pain has been constant since that time. Not caused by exertion or relieved by rest. No diaphoresis. No nausea or vomiting. Did feel some palpitations with rapid heart rate. Not pleuritic in nature. No recent immobilization. States she has felt this way before when her HR got high. Has felt in normal state of health recently with no recent illness. At the orthopedist office, she was told she did not look well so they checked a CBG. Per patient, CBG 275 but she was given grape juice. Does not want nitroglycerin as it gives her headaches.  Past Medical History  Diagnosis Date  . HTN (hypertension)   . Chronic diastolic CHF (congestive heart failure)   . DM (diabetes mellitus)   . Hyperlipidemia   . Allergic rhinitis   . Sleep apnea     compliant with CPAP  . COPD (chronic obstructive pulmonary disease)   . GERD (gastroesophageal reflux disease)   . Headache(784.0)   . Arthritis   . Depression   . Obesity   . DVT (deep vein thrombosis) in pregnancy    Past Surgical History  Procedure Laterality Date  . Abdominal hysterectomy    . Diagnostic laparoscopy     Family History  Problem Relation Age of Onset  . CAD     History  Substance Use Topics  . Smoking status: Never Smoker   . Smokeless tobacco: Not on file  . Alcohol Use: No    OB History   Grav Para Term Preterm Abortions TAB SAB Ect Mult  Living                  Review of Systems  Constitutional: Positive for fatigue. Negative for fever, chills, diaphoresis, appetite change and unexpected weight change.  HENT: Negative for trouble swallowing, neck pain and neck stiffness.   Respiratory: Positive for shortness of breath. Negative for cough and choking.   Cardiovascular: Positive for chest pain and palpitations. Negative for leg swelling.  Gastrointestinal: Negative for nausea, abdominal pain, diarrhea, constipation and blood in stool.  Endocrine: Negative for polydipsia and polyuria.  Genitourinary: Negative for dysuria, frequency, hematuria and difficulty urinating.  Musculoskeletal: Negative for back pain, arthralgias and gait problem.  Skin: Negative for color change and pallor.  Neurological: Negative for dizziness and headaches.  Hematological: Negative for adenopathy. Does not bruise/bleed easily.    Allergies  Sulfa antibiotics  Home Medications   Current Outpatient Rx  Name  Route  Sig  Dispense  Refill  . Alogliptin-Metformin HCl (KAZANO) 12.5-500 MG TABS   Oral   Take by mouth daily.         Marland Kitchen aspirin EC 81 MG EC tablet   Oral   Take 1 tablet (81 mg total) by mouth daily.   30 tablet   3   . carvedilol (COREG)  12.5 MG tablet   Oral   Take 12.5 mg by mouth daily.          . clorazepate (TRANXENE) 15 MG tablet   Oral   Take 15 mg by mouth Twice daily.          . CRESTOR 10 MG tablet   Oral   Take 10 mg by mouth daily.          . digoxin (LANOXIN) 0.25 MG tablet   Oral   Take 1 tablet (0.25 mg total) by mouth daily.   30 tablet   3   . furosemide (LASIX) 40 MG tablet   Oral   Take 20 mg by mouth daily.          Marland Kitchen gabapentin (NEURONTIN) 300 MG capsule   Oral   Take 300 mg by mouth at bedtime.          Marland Kitchen HYDROcodone-acetaminophen (NORCO/VICODIN) 5-325 MG per tablet      as needed.         . insulin aspart (NOVOLOG) 100 UNIT/ML injection   Subcutaneous   Inject 5-12  Units into the skin every 6 (six) hours. 200-300 takes 5 units, 301-400 takes 7 units, 401-500 takes 12 units.          . insulin glargine (LANTUS) 100 UNIT/ML injection   Subcutaneous   Inject 40 Units into the skin as directed.          Marland Kitchen JANUVIA 50 MG tablet   Oral   Take 1 tablet by mouth daily.         Marland Kitchen losartan (COZAAR) 50 MG tablet   Oral   Take 1 tablet by mouth daily.         Marland Kitchen omeprazole (PRILOSEC) 40 MG capsule   Oral   Take 40 mg by mouth daily.          Marland Kitchen PATANOL 0.1 % ophthalmic solution   Both Eyes   Place 1 drop into both eyes as directed.          . potassium chloride SA (K-DUR,KLOR-CON) 20 MEQ tablet   Oral   Take 20 mEq by mouth Twice daily.          Marland Kitchen PROAIR HFA 108 (90 BASE) MCG/ACT inhaler      as needed.         Marland Kitchen spironolactone (ALDACTONE) 25 MG tablet   Oral   Take 12.5 mg by mouth daily.          Marland Kitchen warfarin (COUMADIN) 2 MG tablet   Oral   Take 2 mg by mouth See admin instructions. Takes 2 mg all days except no coumadin on sundays.           BP 116/74  Pulse 138  Temp(Src) 97.6 F (36.4 C) (Oral)  Resp 24  SpO2 96%  Physical Exam  Constitutional: She appears well-developed and well-nourished. No distress.  Obese  HENT:  Head: Normocephalic and atraumatic.  Mouth/Throat: No oropharyngeal exudate.  Eyes: EOM are normal. Pupils are equal, round, and reactive to light.  Neck: Normal range of motion. Neck supple.  Cardiovascular: Intact distal pulses.  Exam reveals no gallop and no friction rub.   No murmur heard. Tachycardic to 140s.   Pulmonary/Chest: Effort normal and breath sounds normal. She has no wheezes. She has no rales. She exhibits no tenderness.  Abdominal: Soft. Bowel sounds are normal. She exhibits no distension. There is no rebound and no guarding.  Musculoskeletal: Normal range of motion. She exhibits edema (trace).    ED Course  Procedures (including critical care time)  Labs Reviewed  PRO B  NATRIURETIC PEPTIDE - Abnormal; Notable for the following:    Pro B Natriuretic peptide (BNP) 297.0 (*)    All other components within normal limits  CBC - Abnormal; Notable for the following:    WBC 11.7 (*)    All other components within normal limits  COMPREHENSIVE METABOLIC PANEL - Abnormal; Notable for the following:    Glucose, Bld 305 (*)    Creatinine, Ser 1.37 (*)    Total Protein 8.8 (*)    Albumin 3.4 (*)    GFR calc non Af Amer 42 (*)    GFR calc Af Amer 49 (*)    All other components within normal limits  PROTIME-INR - Abnormal; Notable for the following:    Prothrombin Time 25.7 (*)    INR 2.48 (*)    All other components within normal limits  POCT I-STAT, CHEM 8 - Abnormal; Notable for the following:    Creatinine, Ser 1.30 (*)    Glucose, Bld 307 (*)    Hemoglobin 16.0 (*)    HCT 47.0 (*)    All other components within normal limits  DIGOXIN LEVEL  TROPONIN I  POCT I-STAT TROPONIN I   Dg Chest 2 View  01/20/2013   *RADIOLOGY REPORT*  Clinical Data: Chest pain.  CHEST - 2 VIEW  Comparison: PA and lateral chest 02/26/2012.  Findings: Heart size is upper normal.  Lungs are clear.  No pneumothorax or pleural effusion.  IMPRESSION: No acute disease.   Original Report Authenticated By: Orlean Patten, M.D.   1. Chest pain   2. Atrial fibrillation with RVR    EKG-a fib with RVR (rate 147). Right axis deviation. Std depression on report but due to irregular baseline. No ischemic changes.   MDM  57 year old female with history of a fib on coumadin presenting with chest pain and a fib with RVR.  Chest pain has been persistent in ED despite control of HR (down from 147) to 90s with diltiazem bolus 20mg . Had planned to start dilt gtt but HR came from 100-110 down to 90s. Have discussed case with Dr. Terrence Dupont and patient will be admitted to his service for observation of HR and cycling of troponins. Dr. Terrence Dupont believes this is low risk for ACS given low risk stress test in  July of last year.   Marin Olp, MD 01/20/13 1525

## 2013-01-20 NOTE — Progress Notes (Signed)
ANTICOAGULATION CONSULT NOTE - Initial Consult  Pharmacy Consult for coumadin Indication: atrial fibrillation  Allergies  Allergen Reactions  . Sulfa Antibiotics Itching    Patient Measurements:  Wt = 181.5 kg as of 09/06/12  Vital Signs: Temp: 97.6 F (36.4 C) (06/02 1205) Temp src: Oral (06/02 1205) BP: 104/68 mmHg (06/02 1500) Pulse Rate: 56 (06/02 1500)  Labs:  Recent Labs  01/20/13 1200 01/20/13 1227  HGB 14.4 16.0*  HCT 42.8 47.0*  PLT 267  --   LABPROT 25.7*  --   INR 2.48*  --   CREATININE 1.37* 1.30*    The CrCl is unknown because both a height and weight (above a minimum accepted value) are required for this calculation.   Medical History: Past Medical History  Diagnosis Date  . HTN (hypertension)   . Chronic diastolic CHF (congestive heart failure)   . DM (diabetes mellitus)   . Hyperlipidemia   . Allergic rhinitis   . Sleep apnea     compliant with CPAP  . COPD (chronic obstructive pulmonary disease)   . GERD (gastroesophageal reflux disease)   . Headache(784.0)   . Arthritis   . Depression   . Obesity   . DVT (deep vein thrombosis) in pregnancy     Medications:  Prescriptions prior to admission  Medication Sig Dispense Refill  . Alogliptin-Metformin HCl (KAZANO) 12.5-500 MG TABS Take 1 tablet by mouth daily.       Marland Kitchen aspirin EC 81 MG EC tablet Take 1 tablet (81 mg total) by mouth daily.  30 tablet  3  . carvedilol (COREG) 12.5 MG tablet Take 12.5 mg by mouth daily.       . clorazepate (TRANXENE) 15 MG tablet Take 15 mg by mouth Twice daily.       . CRESTOR 10 MG tablet Take 10 mg by mouth daily.       . digoxin (LANOXIN) 0.25 MG tablet Take 1 tablet (0.25 mg total) by mouth daily.  30 tablet  3  . doxycycline (VIBRAMYCIN) 100 MG capsule Take 100 mg by mouth 2 (two) times daily.      . furosemide (LASIX) 40 MG tablet Take 20 mg by mouth daily.       Marland Kitchen gabapentin (NEURONTIN) 300 MG capsule Take 300 mg by mouth at bedtime.       . insulin  aspart (NOVOLOG) 100 UNIT/ML injection Inject 5-12 Units into the skin every 6 (six) hours. 200-300 takes 5 units, 301-400 takes 7 units, 401-500 takes 12 units.       . insulin glargine (LANTUS) 100 UNIT/ML injection Inject 40 Units into the skin 2 (two) times daily.       Marland Kitchen JANUVIA 50 MG tablet Take 1 tablet by mouth daily.      Marland Kitchen losartan (COZAAR) 50 MG tablet Take 1 tablet by mouth daily.      Marland Kitchen omeprazole (PRILOSEC) 40 MG capsule Take 40 mg by mouth daily.       Marland Kitchen oxyCODONE-acetaminophen (PERCOCET/ROXICET) 5-325 MG per tablet Take 1 tablet by mouth every 4 (four) hours as needed for pain.      Marland Kitchen PATANOL 0.1 % ophthalmic solution Place 1 drop into both eyes 2 (two) times daily.       . potassium chloride SA (K-DUR,KLOR-CON) 20 MEQ tablet Take 20 mEq by mouth Twice daily.       Marland Kitchen PROAIR HFA 108 (90 BASE) MCG/ACT inhaler 1 puff every 4 (four) hours as needed for wheezing.       Marland Kitchen  spironolactone (ALDACTONE) 25 MG tablet Take 12.5 mg by mouth daily.       Marland Kitchen warfarin (COUMADIN) 2 MG tablet Take 2 mg by mouth daily. Takes 2 mg all days except no coumadin on sundays.        Assessment: [name] is a 57 yo F on coumadin for Afib.  She has been admitted to w/u chest pain and was noted to be in Afib with RVR. Her home coumadin dose is 2 mg daily except for none on Sundays. Her last dose PTA was 6/1.  Her admission INR is therapeutic at 2.48.  Her CBC is wnl with no bleeding reported.   Goal of Therapy:  INR 2-3    Plan:  1. Continue home dose of 2 mg daily except for none on Sundays 2. Daily INR Eudelia Bunch, Pharm.D. QP:[id number] 01/20/2013 4:08 PM

## 2013-01-21 ENCOUNTER — Encounter (HOSPITAL_COMMUNITY): Payer: Self-pay | Admitting: General Practice

## 2013-01-21 LAB — BASIC METABOLIC PANEL
BUN: 15 mg/dL (ref 6–23)
CO2: 28 mEq/L (ref 19–32)
Calcium: 9.1 mg/dL (ref 8.4–10.5)
Chloride: 97 mEq/L (ref 96–112)
Creatinine, Ser: 1.35 mg/dL — ABNORMAL HIGH (ref 0.50–1.10)
GFR calc Af Amer: 50 mL/min — ABNORMAL LOW (ref >90)
GFR calc non Af Amer: 43 mL/min — ABNORMAL LOW (ref >90)
Glucose, Bld: 313 mg/dL — ABNORMAL HIGH (ref 70–99)
Potassium: 4.2 mEq/L (ref 3.5–5.1)
Sodium: 134 mEq/L — ABNORMAL LOW (ref 135–145)

## 2013-01-21 LAB — CBC
HCT: 39.2 % (ref 36.0–46.0)
Hemoglobin: 13 g/dL (ref 12.0–15.0)
MCH: 27.9 pg (ref 26.0–34.0)
MCHC: 33.2 g/dL (ref 30.0–36.0)
MCV: 84.1 fL (ref 78.0–100.0)
Platelets: 227 10*3/uL (ref 150–400)
RBC: 4.66 MIL/uL (ref 3.87–5.11)
RDW: 14.6 % (ref 11.5–15.5)
WBC: 8.6 10*3/uL (ref 4.0–10.5)

## 2013-01-21 LAB — PROTIME-INR
INR: 2.51 — ABNORMAL HIGH (ref 0.00–1.49)
Prothrombin Time: 25.9 seconds — ABNORMAL HIGH (ref 11.6–15.2)

## 2013-01-21 LAB — LIPID PANEL
Cholesterol: 101 mg/dL (ref 0–200)
HDL: 33 mg/dL — ABNORMAL LOW (ref >39)
LDL Cholesterol: 48 mg/dL (ref 0–99)
Total CHOL/HDL Ratio: 3.1 RATIO
Triglycerides: 99 mg/dL (ref <150)
VLDL: 20 mg/dL (ref 0–40)

## 2013-01-21 LAB — GLUCOSE, CAPILLARY
Glucose-Capillary: 273 mg/dL — ABNORMAL HIGH (ref 70–99)
Glucose-Capillary: 270 mg/dL — ABNORMAL HIGH (ref 70–99)

## 2013-01-21 LAB — TROPONIN I: Troponin I: <0.3 ng/mL (ref <0.30)

## 2013-01-21 MED ORDER — CARVEDILOL 12.5 MG PO TABS: 18.7500 mg | ORAL_TABLET | Freq: Every day | ORAL | Status: DC

## 2013-01-21 NOTE — Progress Notes (Signed)
Pt complains of chest pain with PS 6/10, EKG done, Nitroglycerin SL given twice and PS went down to 2/10, SBP running low, Dr.Harwani notified and ordered to titrate Cardizem drip from 5 mg/5 ml to 2 mg/2 ml. Will continue to monitor pt.

## 2013-01-21 NOTE — ED Provider Notes (Signed)
I saw and evaluated the patient, reviewed the resident's note and I agree with the findings and plan.   .Face to face Exam:  General:  Awake HEENT:  Atraumatic Resp:  Normal effort Abd:  Nondistended Neuro:No focal weakness  Medications  diltiazem (CARDIZEM) injection 20 mg (0 mg Intravenous Stopped 01/20/13 1421)    CRITICAL CARE Performed by: Leonard Schwartz L Total critical care time: 30 min Critical care time was exclusive of separately billable procedures and treating other patients. Critical care was necessary to treat or prevent imminent or life-threatening deterioration. Critical care was time spent personally by me on the following activities: development of treatment plan with patient and/or surrogate as well as nursing, discussions with consultants, evaluation of patient's response to treatment, examination of patient, obtaining history from patient or surrogate, ordering and performing treatments and interventions, ordering and review of laboratory studies, ordering and review of radiographic studies, pulse oximetry and re-evaluation of patient's condition.   Dot Lanes, MD 01/21/13 2014

## 2013-01-21 NOTE — Progress Notes (Signed)
UR Completed. Meylin Stenzel, RN, BSN Nurse Case Manager  [phone]  

## 2013-01-21 NOTE — Discharge Summary (Signed)
  Discharge summary dictated on 01/21/2013 dictation number is [phone]

## 2013-01-21 NOTE — Progress Notes (Signed)
ANTICOAGULATION CONSULT NOTE - Follow-up  Pharmacy Consult for coumadin Indication: atrial fibrillation  Allergies  Allergen Reactions  . Sulfa Antibiotics Itching    Patient Measurements: Height: 5\' 5"  (165.1 cm) Weight: 365 lb 11.2 oz (165.88 kg) (Scale C) IBW/kg (Calculated) : 57  Vital Signs: Temp: 97.5 F (36.4 C) (06/03 0429) Temp src: Oral (06/03 0429) BP: 113/86 mmHg (06/03 0429) Pulse Rate: 88 (06/03 0429)  Labs:  Recent Labs  01/20/13 1200 01/20/13 1227 01/20/13 1522 01/20/13 1628 01/20/13 2206 01/21/13 0440  HGB 14.4 16.0*  --  13.4  --  13.0  HCT 42.8 47.0*  --  41.1  --  39.2  PLT 267  --   --  226  --  227  APTT  --   --   --  33  --   --   LABPROT 25.7*  --   --  25.9*  --  25.9*  INR 2.48*  --   --  2.51*  --  2.51*  CREATININE 1.37* 1.30*  --  1.36*  --  1.35*  TROPONINI  --   --  <0.30  --  <0.30 <0.30    Estimated Creatinine Clearance: 73.9 ml/min (by C-G formula based on Cr of 1.35).  Assessment: [name] is a 57 yo F on coumadin for Afib.  She has been admitted to w/u chest pain and was noted to be in Afib with RVR. Her home coumadin dose is 2 mg daily except for none on Sundays. Her last dose PTA was 6/1. Her INR remains therapeutic and her CBC is stable. No bleeding noted.  Goal of Therapy:  INR 2-3  Plan:  1. Continue home dose of 2 mg daily except for none on Sundays 2. Daily INR  Salome Arnt, PharmD, BCPS Pager # [phone] 01/21/2013 8:45 AM

## 2013-01-22 NOTE — Discharge Summary (Signed)
[name], ARRONA NO.:  [id number]  MEDICAL RECORD NO.:  [id number]  LOCATION:  4705                         FACILITY:  [facility]  PHYSICIAN:  Allegra Lai. Terrence Dupont, M.D. DATE OF BIRTH:  05-09-1956  DATE OF ADMISSION:  01/20/2013 DATE OF DISCHARGE:  01/21/2013                              DISCHARGE SUMMARY   ADMITTING DIAGNOSES: 1. Atypical chest pain rule out myocardial infarction, atrial     fibrillation with rapid ventricular response. 2. Uncontrolled diabetes mellitus. 3. Hypertension. 4. Chronic obstructive pulmonary disease. 5. Obstructive sleep apnea/obesity hypoventilation syndrome. 6. Morbid obesity. 7. Chronic kidney disease. 8. History of deep venous thrombosis. 9. Depression. 10.Degenerative joint disease.  DISCHARGE DIAGNOSES: 1. Status post atypical chest pain, myocardial infarction ruled out.     Mild coronary artery disease in the past.  Negative nuclear stress     test last year. 2. Atrial fibrillation with controlled ventricular response. 3. Insulin-requiring diabetes mellitus. 4. Hypertension. 5. Chronic obstructive pulmonary disease. 6. Obstructive sleep apnea/obesity hypoventilation syndrome. 7. Morbid obesity. 8. Chronic kidney disease. 9. History of deep venous thrombosis. 10.Depression. 11.Degenerative joint disease.  DISCHARGE HOME MEDICATIONS:  Carvedilol dose has been increased to 18.75 mg twice daily.  Rest of her home medications are same, i.e., enteric- coated aspirin 81 mg one tablet daily; Tranxene 15 mg twice daily as before; Crestor 10 mg daily; digoxin 0.25 mg one tablet daily; doxycycline 100 mg twice daily as before; Lasix 20 mg daily; gabapentin 300 mg at bedtime; Lantus insulin 40 units twice daily as before; NovoLog sliding scale as before, Januvia 50 mg one tablet daily as before; Kazano 12.5/500 mg one tablet daily as before; losartan 50 mg one tablet daily as before; omeprazole 40 mg one daily as  before; Percocet 1 tablet every 4 hours as needed for pain as before; Patanol 0.1% ophthalmic solution as before; K-Dur 20 mEq twice daily as before; albuterol inhaler every 4 hours as before as needed; Aldactone 25 mg 1 tablet daily as before; warfarin 2 mg daily except on Sundays.  No Coumadin as before.  DIET:  Low-salt, low-cholesterol 1800-calorie ADA diet.  The patient has been advised to monitor blood sugar and blood pressure daily.  The patient has been advised to reduce weight.  Follow up with Dr. Montez Morita in 1 week.  CONDITION AT DISCHARGE:  Stable.  BRIEF HISTORY AND HOSPITAL COURSE:  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  She came to the ER from Dr. Rudene Anda office complaining of left-sided chest pain associated with nausea, palpitation, and dizziness and was noted to be in AFib with RVR.  The patient denies any exertional chest pain but complains of exertional dyspnea with minimal exertion. Denies any syncopal episode.  Denies PND, orthopnea, but complains of mild leg swelling.  Denies cough, fever, chills.  States she has been taking her medications regularly lately.  PAST MEDICAL HISTORY:  As above.  PAST SURGICAL HISTORY:  She had laparoscopy in  the past.  Had abdominal hysterectomy in the past.  PHYSICAL EXAMINATION:  GENERAL:  She was alert, awake, oriented x3. VITAL SIGNS:  Blood pressure was 104/68.  When seen in the ER, pulse was 56, irregularly irregular.  The patient received Cardizem 20 mg bolus and was started on 5 mg drip. HEENT:  Conjunctiva  was pink. NECK:  Supple.  No JVD. LUNGS:  Clear to auscultation without rhonchi or rales. CARDIOVASCULAR:  Irregularly irregular.  S1 and S2 are soft.  There was 2/6 systolic murmur noted. ABDOMEN:  Soft, distended, nontender. EXTREMITIES:  There is no clubbing, cyanosis, or edema. NEUROLOGIC:  Grossly intact.  LABORATORY DATA:  Sodium was 138, potassium 3.9, BUN 15, creatinine 1.30.  Blood sugar was 307.  Four sets of troponin-I were negative. Cholesterol was 101, LDL was 48, triglycerides 93, HDL was 33. Hemoglobin was 13.4, hematocrit 41.1, white count of 12.2, repeat hemoglobin was 13, hematocrit 39.2, white count of 8.6.  PT was 25.9, INR 2.51.  This morning also PT is 25.9, INR 2.51.  Her hemoglobin A1c was elevated 11.4.  TSH was normal 2.13.  Chest x-ray showed no active disease.  Initial EKG done showed AFib with RVR.  Heart rate in 140s. Minor ST-T wave changes.  Nondiagnostic Q wave in inferior leads and poor R-wave progression.  Repeat EKG done this morning shows AFib with controlled ventricular response and small inferior Q waves in lead III and aVF and poor R-wave progression, as before and nonspecific ST-T wave changes.  There were no new acute ischemic changes.  BRIEF HOSPITAL COURSE:  The patient was admitted to telemetry unit.  MI was ruled out by serial enzymes and EKG.  The patient did not have any further episodes of anginal chest pain but does complain of musculoskeletal left-sided chest pain.  The patient had nuclear stress test last year, which was negative for ischemia and had also cardiac cath many years ago, which showed mild coronary artery disease. Patient's INR remains in therapeutic range.  The patient has been advised to monitor blood sugar and blood pressure daily.  Her Coreg dose has been increased.  The patient will be followed up by Dr. Montez Morita in 1 week.     Allegra Lai. Terrence Dupont, M.D.     MNH/MEDQ  D:  01/21/2013  T:  01/21/2013  Job:   [id number]  cc:   Ardyth Gal. Spruill, M.D.

## 2013-02-17 ENCOUNTER — Ambulatory Visit: Payer: Medicare Other | Admitting: Internal Medicine

## 2013-03-26 ENCOUNTER — Encounter: Payer: Self-pay | Admitting: Internal Medicine

## 2013-03-26 ENCOUNTER — Ambulatory Visit (INDEPENDENT_AMBULATORY_CARE_PROVIDER_SITE_OTHER): Payer: Medicare Other | Admitting: Internal Medicine

## 2013-03-26 VITALS — BP 142/87 | HR 96 | Ht 64.0 in | Wt 386.4 lb

## 2013-03-26 DIAGNOSIS — R079 Chest pain, unspecified: Secondary | ICD-10-CM

## 2013-03-26 DIAGNOSIS — G4733 Obstructive sleep apnea (adult) (pediatric): Secondary | ICD-10-CM

## 2013-03-26 DIAGNOSIS — I4891 Unspecified atrial fibrillation: Secondary | ICD-10-CM

## 2013-03-26 NOTE — Patient Instructions (Addendum)
Your physician wants you to follow-up in: 8 months with Dr Vallery Ridge will receive a reminder letter in the mail two months in advance. If you don't receive a letter, please call our office to schedule the follow-up appointment.   Dr Hassell Done [phone] Sign up for the class

## 2013-03-26 NOTE — Progress Notes (Signed)
Primary Care Physician: Patricia Nettle, MD Referring Physician:  Dr Genevie Ann [name] is a 57 y.o. female with a h/o longstanding persistent atrial fibrillation who presents today for EP consultation.    She continues to have atypical and likely musculoskeletal chest pain.  She was recently evaluated by Dr Terrence Dupont at [facility]. She is chronically anticoagulated with coumadin.  She follows with Dr Montez Morita.  She reports some improvement in palpitations with better rate control.   Past Medical History  Diagnosis Date  . HTN (hypertension)   . Chronic diastolic CHF (congestive heart failure)   . DM (diabetes mellitus)   . Hyperlipidemia   . Allergic rhinitis   . Sleep apnea     compliant with CPAP  . COPD (chronic obstructive pulmonary disease)   . GERD (gastroesophageal reflux disease)   . Headache(784.0)   . Arthritis   . Depression   . Obesity   . DVT (deep vein thrombosis) in pregnancy   . Dysrhythmia     atrial fibrilation  . Shortness of breath    Past Surgical History  Procedure Laterality Date  . Abdominal hysterectomy    . Diagnostic laparoscopy    . Knee surgery    . Cholecystectomy      Current Outpatient Prescriptions  Medication Sig Dispense Refill  . Alogliptin-Metformin HCl (KAZANO) 12.5-500 MG TABS Take 1 tablet by mouth daily.       . carvedilol (COREG) 12.5 MG tablet Take 1.5 tablets (18.75 mg total) by mouth daily.  90 tablet  3  . clorazepate (TRANXENE) 15 MG tablet Take 15 mg by mouth Twice daily.       . CRESTOR 10 MG tablet Take 10 mg by mouth daily.       Marland Kitchen doxycycline (VIBRAMYCIN) 100 MG capsule Take 100 mg by mouth 2 (two) times daily.      . furosemide (LASIX) 40 MG tablet Take 20 mg by mouth daily.       Marland Kitchen gabapentin (NEURONTIN) 300 MG capsule Take 300 mg by mouth at bedtime.       . insulin aspart (NOVOLOG) 100 UNIT/ML injection Inject 5-12 Units into the skin every 6 (six) hours. 200-300 takes 5 units, 301-400 takes 7 units,  401-500 takes 12 units.       Marland Kitchen JANUVIA 50 MG tablet Take 1 tablet by mouth daily.      Marland Kitchen losartan (COZAAR) 50 MG tablet Take 1 tablet by mouth daily.      . meloxicam (MOBIC) 7.5 MG tablet       . omeprazole (PRILOSEC) 40 MG capsule Take 40 mg by mouth daily.       Marland Kitchen oxyCODONE (OXY IR/ROXICODONE) 5 MG immediate release tablet       . oxyCODONE-acetaminophen (PERCOCET/ROXICET) 5-325 MG per tablet Take 1 tablet by mouth every 4 (four) hours as needed for pain.      Marland Kitchen PATANOL 0.1 % ophthalmic solution Place 1 drop into both eyes 2 (two) times daily.       . potassium chloride SA (K-DUR,KLOR-CON) 20 MEQ tablet Take 20 mEq by mouth Twice daily.       Marland Kitchen PROAIR HFA 108 (90 BASE) MCG/ACT inhaler 1 puff every 4 (four) hours as needed for wheezing.       Marland Kitchen spironolactone (ALDACTONE) 25 MG tablet Take 12.5 mg by mouth daily.       Marland Kitchen warfarin (COUMADIN) 2 MG tablet Take 2 mg by mouth daily. Takes 2 mg all  days except no coumadin on sundays.      . digoxin (LANOXIN) 0.25 MG tablet Take 1 tablet (0.25 mg total) by mouth daily.  30 tablet  3  . insulin glargine (LANTUS) 100 UNIT/ML injection Inject 40 Units into the skin 2 (two) times daily.        No current facility-administered medications for this visit.    Allergies  Allergen Reactions  . Sulfa Antibiotics Itching    History   Social History  . Marital Status: Married    Spouse Name: N/A    Number of Children: N/A  . Years of Education: N/A   Occupational History  . Not on file.   Social History Main Topics  . Smoking status: Never Smoker   . Smokeless tobacco: Never Used  . Alcohol Use: No  . Drug Use: No  . Sexually Active: Not on file   Other Topics Concern  . Not on file   Social History Narrative   Pt lives in [place] with spouse.  2 grown children.   Previously worked in [employer] at [employer].  Now on disability    Family History  Problem Relation Age of Onset  . CAD     Physical Exam: Filed Vitals:   03/26/13  1541  BP: 142/87  Pulse: 96  Height: 5\' 4"  (1.626 m)  Weight: 386 lb 6.4 oz (175.27 kg)    GEN- The patient is  Morbidly obese appearing, alert and oriented x 3 today.   Head- normocephalic, atraumatic Eyes-  Sclera clear, conjunctiva pink Ears- hearing intact Oropharynx- clear Neck- supple,   Lungs- Clear to ausculation bilaterally, normal work of breathing Heart- irregular rate and rhythm, no murmurs, rubs or gallops, PMI not laterally displaced GI- soft, NT, ND, + BS Extremities- no clubbing, cyanosis, + edema MS- no significant deformity or atrophy, walks very slowly, unable to maneuver onto the exam table today due to her obesity  Echo 07/03/12- EF 60-65%, moderate TR, moderate biatrial enlargement  EKG today reveals afib,  Vrate 99 bpm, LPHB,  nonspecific St/T changes  Assessment and Plan:  1. afib Rate controlled Anticoagulated  2. OSA- noncompliant with CPAP  3. Obesity- she has been unable to lose weight.  She has been noncompliant with my recommendation to proceed with bariatric evaluation/ bariatric clinic  4. HTN Stable No change required today  5. Atypical chest pain Continue to follow closely with Dr Montez Morita  Return in 8 months

## 2013-05-30 ENCOUNTER — Other Ambulatory Visit: Payer: Self-pay | Admitting: *Deleted

## 2013-05-30 ENCOUNTER — Ambulatory Visit (INDEPENDENT_AMBULATORY_CARE_PROVIDER_SITE_OTHER): Payer: Medicare Other

## 2013-05-30 ENCOUNTER — Other Ambulatory Visit: Payer: Self-pay

## 2013-05-30 ENCOUNTER — Telehealth: Payer: Self-pay | Admitting: *Deleted

## 2013-05-30 VITALS — BP 124/69 | HR 81 | Resp 16 | Ht 64.0 in | Wt 394.0 lb

## 2013-05-30 DIAGNOSIS — L600 Ingrowing nail: Secondary | ICD-10-CM

## 2013-05-30 DIAGNOSIS — M79673 Pain in unspecified foot: Secondary | ICD-10-CM

## 2013-05-30 DIAGNOSIS — M79609 Pain in unspecified limb: Secondary | ICD-10-CM

## 2013-05-30 DIAGNOSIS — M722 Plantar fascial fibromatosis: Secondary | ICD-10-CM

## 2013-05-30 DIAGNOSIS — L089 Local infection of the skin and subcutaneous tissue, unspecified: Secondary | ICD-10-CM

## 2013-05-30 DIAGNOSIS — E1142 Type 2 diabetes mellitus with diabetic polyneuropathy: Secondary | ICD-10-CM

## 2013-05-30 DIAGNOSIS — E1149 Type 2 diabetes mellitus with other diabetic neurological complication: Secondary | ICD-10-CM

## 2013-05-30 DIAGNOSIS — E1140 Type 2 diabetes mellitus with diabetic neuropathy, unspecified: Secondary | ICD-10-CM

## 2013-05-30 DIAGNOSIS — L03039 Cellulitis of unspecified toe: Secondary | ICD-10-CM

## 2013-05-30 MED ORDER — CEPHALEXIN 500 MG PO CAPS: 500.0000 mg | ORAL_CAPSULE | Freq: Three times a day (TID) | ORAL | Status: DC

## 2013-05-30 NOTE — Patient Instructions (Addendum)
Betadine Soak Instructions  Purchase an 8 oz. bottle of BETADINE solution (Povidone)  THE DAY AFTER THE PROCEDURE  Place 1 tablespoon of betadine solution in a quart of warm tap water.  Submerge your foot or feet with outer bandage intact for the initial soak; this will allow the bandage to become moist and wet for easy lift off.  Once you remove your bandage, continue to soak in the solution for 20 minutes.  This soak should be done twice a day.  Next, remove your foot or feet from solution, blot dry the affected area and cover.  You may use a band aid large enough to cover the area or use gauze and tape.  Apply other medications to the area as directed by the doctor such as cortisporin otic solution (ear drops) or neosporin.  IF YOUR SKIN BECOMES IRRITATED WHILE USING THESE INSTRUCTIONS, IT IS OKAY TO SWITCH TO EPSOM SALTS AND WATER OR WHITE VINEGAR AND WATER.  [facility] Instructions-Post Nail Surgery  You have had your ingrown toenail and root treated with a chemical.  This chemical causes a burn that will drain and ooze like a blister.  This can drain for 6-8 weeks or longer.  It is important to keep this area clean, covered, and follow the soaking instructions dispensed at the time of your surgery.  This area will eventually dry and form a scab.  Once the scab forms you no longer need to soak or apply a dressing.  If at any time you experience an increase in pain, redness, swelling, or drainage, you should contact the office as soon as possible.    Diabetes and Foot Care Diabetes may cause you to have a poor blood supply (circulation) to your legs and feet. Because of this, the skin may be thinner, break easier, and heal more slowly. You also may have nerve damage in your legs and feet causing decreased feeling. You may not notice minor injuries to your feet that could lead to serious problems or infections. Taking care of your feet is one of the most important things you can do for  yourself.  HOME CARE INSTRUCTIONS  Do not go barefoot. Bare feet are easily injured.  Check your feet daily for blisters, cuts, and redness.  Wash your feet with warm water (not hot) and mild soap. Pat your feet and between your toes until completely dry.  Apply a moisturizing lotion that does not contain alcohol or petroleum jelly to the dry skin on your feet and to dry brittle toenails. Do not put it between your toes.  Trim your toenails straight across. Do not dig under them or around the cuticle.  Do not cut corns or calluses, or try to remove them with medicine.  Wear clean cotton socks or stockings every day. Make sure they are not too tight. Do not wear knee high stockings since they may decrease blood flow to your legs.  Wear leather shoes that fit properly and have enough cushioning. To break in new shoes, wear them just a few hours a day to avoid injuring your feet.  Wear shoes at all times, even in the house.  Do not cross your legs. This may decrease the blood flow to your feet.  If you find a minor scrape, cut, or break in the skin on your feet, keep it and the skin around it clean and dry. These areas may be cleansed with mild soap and water. Do not use peroxide, alcohol, iodine or  Merthiolate.  When you remove an adhesive bandage, be sure not to harm the skin around it.  If you have a wound, look at it several times a day to make sure it is healing.  Do not use heating pads or hot water bottles. Burns can occur. If you have lost feeling in your feet or legs, you may not know it is happening until it is too late.  Report any cuts, sores or bruises to your caregiver. Do not wait! SEEK MEDICAL CARE IF:   You have an injury that is not healing or you notice redness, numbness, burning, or tingling.  Your feet always feel cold.  You have pain or cramps in your legs and feet. SEEK IMMEDIATE MEDICAL CARE IF:   There is increasing redness, swelling, or increasing pain  in the wound.  There is a red line that goes up your leg.  Pus is coming from a wound.  You develop an unexplained oral temperature above 102 F (38.9 C), or as your caregiver suggests.  You notice a bad smell coming from an ulcer or wound. MAKE SURE YOU:   Understand these instructions.  Will watch your condition.  Will get help right away if you are not doing well or get worse. Document Released: 08/04/2000 Document Revised: 10/30/2011 Document Reviewed: 02/10/2009 [facility] Patient Information 2014 [place], [place].

## 2013-05-30 NOTE — Progress Notes (Signed)
Subjective:    Patient ID: [name], female    DOB: 1956/06/22, 57 y.o.   MRN: [id number]  HPI Comments: Primary concern:  plantar bilateral - forefoot, arch, heel ( L > R)  Secondary concern: 1st toe left - both borders (Ingrown toenail)  N - throbbing, aches L - 1st toe left (both borders) D - 5 days O - gradual C - ingrown nail, redness, swelling, draining, worse, "he cut this nail out before" A - pressure T - soaking, neosporin  **Patient also states that she needs to pick up her diabetic shoes and insoles**   Foot Pain This is a new problem. The current episode started 1 to 4 weeks ago. The problem occurs daily. The problem has been rapidly worsening. Associated symptoms include congestion. Associated symptoms comments: Numbness, sharp, stabbing. The symptoms are aggravated by walking and standing. She has tried acetaminophen and rest (soaking) for the symptoms. The treatment provided no relief.  Diabetes   the medial border of the left hallux demonstrates purulent discharge and drainage. Slight edema and erythema are noted. The lateral border it is not edematous or erythematous at this time. Patient also has significant increased edema most recently. The pain she is describing in the arch and entire plantar surface of the foot with walking activities possibly consistent with plantar fascial symptomology due to patient's weight and promontory changes of the foot.    Review of Systems  HENT: Positive for congestion and sinus pressure.   Respiratory: Positive for shortness of breath.   Musculoskeletal: Positive for back pain.       Numbness in fingers  Allergic/Immunologic: Positive for environmental allergies.  All other systems reviewed and are negative.       Objective:   Physical Exam  Constitutional: She is oriented to person, place, and time. She appears well-developed and well-nourished.  Cardiovascular:  Pulses:      Dorsalis pedis pulses are 1+ on  the right side, and 1+ on the left side.       Posterior tibial pulses are 0 on the right side, and 0 on the left side.  Significant edema bilateral extremities +3 with pitting consistent with lymphedema, capillary refill time immediate all digits bilateral temperature warm to cool bilateral  Musculoskeletal:  Rectus foot structure bilateral, mild semirigid digital contractures 2 through 5. Muscle strength intact bilateral  Neurological: She is alert and oriented to person, place, and time. She has normal strength.   Epicritic sensations intact although diminished bilateral to the forefoot and digits and plantar foot and Semmes Weinstein testing. There is normal plantar response. DTRs not listed this time.  Skin: Skin is warm and dry. No cyanosis. Nails show no clubbing.  Skin color and pigment normal. Hair growth is absent bilateral. Nails criptotic with some discoloration 1 through 5 bilateral. Left hallux in particular show some edema and erythema the medial L. fold with purulent discharge or drainage being noted. No ascending cellulitis or lymphangitis identified at this time. No fever .  Psychiatric: She has a normal mood and affect.   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  Local anesthetic block Mr. to the left great toe. The medial border was excised with culture being obtained of the current discharge drainage. Deep swab was obtained. The medial nail fold was then treated with 3 applications of phenol followed by an alcohol wash and Iodosorb application. Dry sterile dressing was then applied.  Patient was instructed in postop care including Betadine soaks daily dressing changes with Neosporin Polysporin and a prescription for cephalexin was called in. Recommended Tylenol as needed for pain.  #2 plantar fascial symptomology will be addressed with the orthoses provided diabetic depth shoes. Patient is currently wearing flimsy slippers. Recommended a more stable shoe in the future.  #3 diabetic shoes were tried on at this time however due to the edema did not fit appropriate. The insoles contour well to the patient's foot however shoes will be reordered in a wider larger size in patient will be reappointed with the next 2 weeks for followup.  2 week followup for nail check, shoe fitting and dispensing, and diabetic foot and palliative nail care. Patient advised to contact the changes were increasing in severity her failure to improve. Harriet Masson DPM

## 2013-05-30 NOTE — Telephone Encounter (Signed)
Pt has a 300pm appt with Dr Blenda Mounts today.

## 2013-05-30 NOTE — Telephone Encounter (Signed)
Pt complains of big toe swelling and drainage.  Called spoke with pt's husband Sonia Side left message to have pt make an appt for today.

## 2013-06-03 LAB — CULTURE, ROUTINE-ABSCESS

## 2013-06-10 ENCOUNTER — Ambulatory Visit (INDEPENDENT_AMBULATORY_CARE_PROVIDER_SITE_OTHER): Payer: Medicare Other

## 2013-06-10 VITALS — BP 134/83 | HR 94 | Resp 28

## 2013-06-10 DIAGNOSIS — E1142 Type 2 diabetes mellitus with diabetic polyneuropathy: Secondary | ICD-10-CM

## 2013-06-10 DIAGNOSIS — L608 Other nail disorders: Secondary | ICD-10-CM

## 2013-06-10 DIAGNOSIS — M722 Plantar fascial fibromatosis: Secondary | ICD-10-CM

## 2013-06-10 DIAGNOSIS — E1149 Type 2 diabetes mellitus with other diabetic neurological complication: Secondary | ICD-10-CM

## 2013-06-10 DIAGNOSIS — E1140 Type 2 diabetes mellitus with diabetic neuropathy, unspecified: Secondary | ICD-10-CM

## 2013-06-10 DIAGNOSIS — L03039 Cellulitis of unspecified toe: Secondary | ICD-10-CM

## 2013-06-10 NOTE — Patient Instructions (Signed)
Peripheral Edema You have swelling in your legs (peripheral edema). This swelling is due to excess accumulation of salt and water in your body. Edema may be a sign of heart, kidney or liver disease, or a side effect of a medication. It may also be due to problems in the leg veins. Elevating your legs and using special support stockings may be very helpful, if the cause of the swelling is due to poor venous circulation. Avoid long periods of standing, whatever the cause. Treatment of edema depends on identifying the cause. Chips, pretzels, pickles and other salty foods should be avoided. Restricting salt in your diet is almost always needed. Water pills (diuretics) are often used to remove the excess salt and water from your body via urine. These medicines prevent the kidney from reabsorbing sodium. This increases urine flow. Diuretic treatment may also result in lowering of potassium levels in your body. Potassium supplements may be needed if you have to use diuretics daily. Daily weights can help you keep track of your progress in clearing your edema. You should call your caregiver for follow up care as recommended. SEEK IMMEDIATE MEDICAL CARE IF:   You have increased swelling, pain, redness, or heat in your legs.  You develop shortness of breath, especially when lying down.  You develop chest or abdominal pain, weakness, or fainting.  You have a fever. Document Released: 09/14/2004 Document Revised: 10/30/2011 Document Reviewed: 08/25/2009 [facility] Patient Information 2014 [place].  Wear compression stockings as instructed at all times and keep legs elevated whenever possible .   Diabetes and Foot Care Diabetes may cause you to have a poor blood supply (circulation) to your legs and feet. Because of this, the skin may be thinner, break easier, and heal more slowly. You also may have nerve damage in your legs and feet causing decreased feeling. You may not notice minor injuries to your  feet that could lead to serious problems or infections. Taking care of your feet is one of the most important things you can do for yourself.  HOME CARE INSTRUCTIONS  Do not go barefoot. Bare feet are easily injured.  Check your feet daily for blisters, cuts, and redness.  Wash your feet with warm water (not hot) and mild soap. Pat your feet and between your toes until completely dry.  Apply a moisturizing lotion that does not contain alcohol or petroleum jelly to the dry skin on your feet and to dry brittle toenails. Do not put it between your toes.  Trim your toenails straight across. Do not dig under them or around the cuticle.  Do not cut corns or calluses, or try to remove them with medicine.  Wear clean cotton socks or stockings every day. Make sure they are not too tight. Do not wear knee high stockings since they may decrease blood flow to your legs.  Wear leather shoes that fit properly and have enough cushioning. To break in new shoes, wear them just a few hours a day to avoid injuring your feet.  Wear shoes at all times, even in the house.  Do not cross your legs. This may decrease the blood flow to your feet.  If you find a minor scrape, cut, or break in the skin on your feet, keep it and the skin around it clean and dry. These areas may be cleansed with mild soap and water. Do not use peroxide, alcohol, iodine or Merthiolate.  When you remove an adhesive bandage, be sure not to harm the  skin around it.  If you have a wound, look at it several times a day to make sure it is healing.  Do not use heating pads or hot water bottles. Burns can occur. If you have lost feeling in your feet or legs, you may not know it is happening until it is too late.  Report any cuts, sores or bruises to your caregiver. Do not wait! SEEK MEDICAL CARE IF:   You have an injury that is not healing or you notice redness, numbness, burning, or tingling.  Your feet always feel cold.  You have  pain or cramps in your legs and feet. SEEK IMMEDIATE MEDICAL CARE IF:   There is increasing redness, swelling, or increasing pain in the wound.  There is a red line that goes up your leg.  Pus is coming from a wound.  You develop an unexplained oral temperature above 102 F (38.9 C), or as your caregiver suggests.  You notice a bad smell coming from an ulcer or wound. MAKE SURE YOU:   Understand these instructions.  Will watch your condition.  Will get help right away if you are not doing well or get worse. Document Released: 08/04/2000 Document Revised: 10/30/2011 Document Reviewed: 02/10/2009 [name] Patient Information 2014 [place], [place].

## 2013-06-10 NOTE — Progress Notes (Signed)
Subjective:     Patient ID: [name], female   DOB: 1956-07-07, 57 y.o.   MRN: [id number]  HPI patient presents this time 11 day status post AP nail procedure medial border of the left hallux mild serous discharge drainage dressing is intact. Patient also is having significant +2 edema bilateral lower extremities has not been able to wear compression stockings to do the dressings on her hallux. Patient also at this time has significant diabetic neuropathy . You presents for diabetic foot and nail care debridement of thick dystrophic probably mycotic nails as requested.   Review of Systems not reviewed at this visit     Objective:   Physical Exam vascular status pedal pulses are intact and thready pulse one over 4 bilateral dorsalis pedis PT difficult palpable due to the +2 edema. Epicritic and proprioceptive sensations intact although diminished on Semmes Weinstein testing bilateral. Capillary fill time 3 seconds all digits. There is slight edema and erythema the medial nail fold left hallux at this time no apparent discharge or drainage no ascending cellulitis lymphangitis. Diabetic shoes are not ready for refitting at this time.     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 I warm water left great toe maintain Neosporin and Band-Aid dressing, resume easily utilizing compression stockings on a daily basis. Talk improve edema maintain elevation possible. Thick friable criptotic dystrophic nails debrided x10 the presence of diabetes cutting factors in neuropathy. Return for followup in 3 months for palliative care. Patient will be contacted by the office when diabetic shoes ready for fitting and re\re dispensing.  Harriet Masson DPM

## 2013-06-10 NOTE — Progress Notes (Signed)
  Subjective:    Patient ID: [name], female    DOB: [birth year], 57 y.o.   MRN: [id number]  HPI  "My nail is doing okay.  My heel is doing okay.  I want him to trim my toenails."    Review of Systems     Objective:   Physical Exam        Assessment & Plan:

## 2013-06-19 ENCOUNTER — Encounter (HOSPITAL_COMMUNITY): Payer: Self-pay | Admitting: Emergency Medicine

## 2013-06-19 ENCOUNTER — Inpatient Hospital Stay (HOSPITAL_COMMUNITY)
Admission: EM | Admit: 2013-06-19 | Discharge: 2013-06-24 | DRG: 313 | Disposition: A | Discharge status: Home Health | Payer: Medicare Other | Attending: Cardiovascular Disease | Admitting: Cardiovascular Disease

## 2013-06-19 ENCOUNTER — Emergency Department (HOSPITAL_COMMUNITY): Payer: Medicare Other

## 2013-06-19 DIAGNOSIS — K219 Gastro-esophageal reflux disease without esophagitis: Secondary | ICD-10-CM | POA: Diagnosis present

## 2013-06-19 DIAGNOSIS — E119 Type 2 diabetes mellitus without complications: Secondary | ICD-10-CM

## 2013-06-19 DIAGNOSIS — I079 Rheumatic tricuspid valve disease, unspecified: Secondary | ICD-10-CM | POA: Diagnosis present

## 2013-06-19 DIAGNOSIS — Z7901 Long term (current) use of anticoagulants: Secondary | ICD-10-CM

## 2013-06-19 DIAGNOSIS — I4891 Unspecified atrial fibrillation: Secondary | ICD-10-CM

## 2013-06-19 DIAGNOSIS — I2789 Other specified pulmonary heart diseases: Secondary | ICD-10-CM | POA: Diagnosis present

## 2013-06-19 DIAGNOSIS — Z794 Long term (current) use of insulin: Secondary | ICD-10-CM

## 2013-06-19 DIAGNOSIS — G4733 Obstructive sleep apnea (adult) (pediatric): Secondary | ICD-10-CM | POA: Diagnosis present

## 2013-06-19 DIAGNOSIS — F329 Major depressive disorder, single episode, unspecified: Secondary | ICD-10-CM | POA: Diagnosis present

## 2013-06-19 DIAGNOSIS — N189 Chronic kidney disease, unspecified: Secondary | ICD-10-CM | POA: Diagnosis present

## 2013-06-19 DIAGNOSIS — F3289 Other specified depressive episodes: Secondary | ICD-10-CM | POA: Diagnosis present

## 2013-06-19 DIAGNOSIS — M1990 Unspecified osteoarthritis, unspecified site: Secondary | ICD-10-CM | POA: Diagnosis present

## 2013-06-19 DIAGNOSIS — I129 Hypertensive chronic kidney disease with stage 1 through stage 4 chronic kidney disease, or unspecified chronic kidney disease: Secondary | ICD-10-CM | POA: Diagnosis present

## 2013-06-19 DIAGNOSIS — J4489 Other specified chronic obstructive pulmonary disease: Secondary | ICD-10-CM | POA: Diagnosis present

## 2013-06-19 DIAGNOSIS — Z6841 Body Mass Index (BMI) 40.0 and over, adult: Secondary | ICD-10-CM

## 2013-06-19 DIAGNOSIS — R079 Chest pain, unspecified: Principal | ICD-10-CM | POA: Diagnosis present

## 2013-06-19 DIAGNOSIS — J449 Chronic obstructive pulmonary disease, unspecified: Secondary | ICD-10-CM | POA: Diagnosis present

## 2013-06-19 DIAGNOSIS — Z86718 Personal history of other venous thrombosis and embolism: Secondary | ICD-10-CM

## 2013-06-19 DIAGNOSIS — E785 Hyperlipidemia, unspecified: Secondary | ICD-10-CM | POA: Diagnosis present

## 2013-06-19 DIAGNOSIS — E662 Morbid (severe) obesity with alveolar hypoventilation: Secondary | ICD-10-CM | POA: Diagnosis present

## 2013-06-19 DIAGNOSIS — Z79899 Other long term (current) drug therapy: Secondary | ICD-10-CM

## 2013-06-19 DIAGNOSIS — I509 Heart failure, unspecified: Secondary | ICD-10-CM | POA: Diagnosis present

## 2013-06-19 DIAGNOSIS — I5032 Chronic diastolic (congestive) heart failure: Secondary | ICD-10-CM | POA: Diagnosis present

## 2013-06-19 LAB — URINALYSIS, ROUTINE W REFLEX MICROSCOPIC
Bilirubin Urine: NEGATIVE
Glucose, UA: NEGATIVE mg/dL
Hgb urine dipstick: NEGATIVE
Ketones, ur: NEGATIVE mg/dL
Leukocytes, UA: NEGATIVE
Nitrite: NEGATIVE
Protein, ur: NEGATIVE mg/dL
Specific Gravity, Urine: 1.009 (ref 1.005–1.030)
Urobilinogen, UA: 0.2 mg/dL (ref 0.0–1.0)
pH: 6.5 (ref 5.0–8.0)

## 2013-06-19 LAB — PRO B NATRIURETIC PEPTIDE: Pro B Natriuretic peptide (BNP): 2177 pg/mL — ABNORMAL HIGH (ref 0–125)

## 2013-06-19 LAB — DIGOXIN LEVEL: Digoxin Level: 1.5 ng/mL (ref 0.8–2.0)

## 2013-06-19 LAB — GLUCOSE, CAPILLARY: Glucose-Capillary: 113 mg/dL — ABNORMAL HIGH (ref 70–99)

## 2013-06-19 LAB — CBC
HCT: 38.1 % (ref 36.0–46.0)
Hemoglobin: 12.3 g/dL (ref 12.0–15.0)
MCH: 27.9 pg (ref 26.0–34.0)
MCHC: 32.3 g/dL (ref 30.0–36.0)
MCV: 86.4 fL (ref 78.0–100.0)
Platelets: 225 10*3/uL (ref 150–400)
RBC: 4.41 MIL/uL (ref 3.87–5.11)
RDW: 14.8 % (ref 11.5–15.5)
WBC: 6.6 10*3/uL (ref 4.0–10.5)

## 2013-06-19 LAB — POCT I-STAT TROPONIN I: Troponin i, poc: 0 ng/mL (ref 0.00–0.08)

## 2013-06-19 LAB — PROTIME-INR
INR: 3.78 — ABNORMAL HIGH (ref 0.00–1.49)
Prothrombin Time: 35.9 seconds — ABNORMAL HIGH (ref 11.6–15.2)

## 2013-06-19 LAB — TROPONIN I: Troponin I: <0.3 ng/mL (ref <0.30)

## 2013-06-19 MED ORDER — GABAPENTIN 300 MG PO CAPS
300.0000 mg | ORAL_CAPSULE | Freq: Every day | ORAL | Status: DC
  Administered 2013-06-19 – 2013-06-23 (×5): 300 mg via ORAL
  Filled 2013-06-19 (×6): qty 1

## 2013-06-19 MED ORDER — CLORAZEPATE DIPOTASSIUM 3.75 MG PO TABS
15.0000 mg | ORAL_TABLET | Freq: Two times a day (BID) | ORAL | Status: DC
  Administered 2013-06-19 – 2013-06-24 (×10): 15 mg via ORAL
  Filled 2013-06-19 (×11): qty 4

## 2013-06-19 MED ORDER — POTASSIUM CHLORIDE CRYS ER 20 MEQ PO TBCR
20.0000 meq | EXTENDED_RELEASE_TABLET | Freq: Two times a day (BID) | ORAL | Status: DC
  Administered 2013-06-19 – 2013-06-24 (×10): 20 meq via ORAL
  Filled 2013-06-19 (×11): qty 1

## 2013-06-19 MED ORDER — LOSARTAN POTASSIUM 50 MG PO TABS
50.0000 mg | ORAL_TABLET | Freq: Every day | ORAL | Status: DC
  Administered 2013-06-20 – 2013-06-24 (×5): 50 mg via ORAL
  Filled 2013-06-19 (×6): qty 1

## 2013-06-19 MED ORDER — OLOPATADINE HCL 0.1 % OP SOLN
1.0000 [drp] | Freq: Two times a day (BID) | OPHTHALMIC | Status: DC
  Administered 2013-06-19 – 2013-06-24 (×9): 1 [drp] via OPHTHALMIC
  Filled 2013-06-19 (×3): qty 5

## 2013-06-19 MED ORDER — ALBUTEROL SULFATE HFA 108 (90 BASE) MCG/ACT IN AERS: 2.0000 | INHALATION_SPRAY | RESPIRATORY_TRACT | Status: DC | PRN

## 2013-06-19 MED ORDER — SPIRONOLACTONE 12.5 MG HALF TABLET
12.5000 mg | ORAL_TABLET | Freq: Every day | ORAL | Status: DC
  Administered 2013-06-20 – 2013-06-24 (×5): 12.5 mg via ORAL
  Filled 2013-06-19 (×6): qty 1

## 2013-06-19 MED ORDER — DIGOXIN 250 MCG PO TABS
0.2500 mg | ORAL_TABLET | Freq: Every day | ORAL | Status: DC
  Administered 2013-06-20 – 2013-06-24 (×5): 0.25 mg via ORAL
  Filled 2013-06-19 (×5): qty 1

## 2013-06-19 MED ORDER — ONDANSETRON HCL 4 MG/2ML IJ SOLN: 4.0000 mg | Freq: Four times a day (QID) | INTRAMUSCULAR | Status: DC | PRN

## 2013-06-19 MED ORDER — SODIUM CHLORIDE 0.9 % IV SOLN: 250.0000 mL | INTRAVENOUS | Status: DC | PRN

## 2013-06-19 MED ORDER — FUROSEMIDE 20 MG PO TABS
20.0000 mg | ORAL_TABLET | Freq: Every day | ORAL | Status: DC
  Administered 2013-06-20 – 2013-06-24 (×5): 20 mg via ORAL
  Filled 2013-06-19 (×5): qty 1

## 2013-06-19 MED ORDER — CARVEDILOL 6.25 MG PO TABS
18.7500 mg | ORAL_TABLET | Freq: Every day | ORAL | Status: DC
  Administered 2013-06-20 – 2013-06-24 (×5): 18.75 mg via ORAL
  Filled 2013-06-19 (×6): qty 1

## 2013-06-19 MED ORDER — FUROSEMIDE 10 MG/ML IJ SOLN
40.0000 mg | Freq: Once | INTRAMUSCULAR | Status: AC
  Administered 2013-06-19: 40 mg via INTRAVENOUS
  Filled 2013-06-19: qty 4

## 2013-06-19 MED ORDER — INSULIN GLARGINE 100 UNIT/ML ~~LOC~~ SOLN
40.0000 [IU] | Freq: Two times a day (BID) | SUBCUTANEOUS | Status: DC
  Administered 2013-06-19 – 2013-06-23 (×8): 40 [IU] via SUBCUTANEOUS
  Filled 2013-06-19 (×11): qty 0.4

## 2013-06-19 MED ORDER — ACETAMINOPHEN 325 MG PO TABS: 650.0000 mg | ORAL_TABLET | ORAL | Status: DC | PRN

## 2013-06-19 MED ORDER — OXYCODONE-ACETAMINOPHEN 5-325 MG PO TABS
1.0000 | ORAL_TABLET | ORAL | Status: DC | PRN
  Administered 2013-06-20 – 2013-06-23 (×5): 1 via ORAL
  Filled 2013-06-19 (×7): qty 1

## 2013-06-19 MED ORDER — WARFARIN - PHYSICIAN DOSING INPATIENT: Freq: Every day | Status: DC

## 2013-06-19 MED ORDER — CLORAZEPATE DIPOTASSIUM 7.5 MG PO TABS: 15.0000 mg | ORAL_TABLET | Freq: Two times a day (BID) | ORAL | Status: DC

## 2013-06-19 MED ORDER — PANTOPRAZOLE SODIUM 40 MG PO TBEC
40.0000 mg | DELAYED_RELEASE_TABLET | Freq: Every day | ORAL | Status: DC
  Administered 2013-06-20 – 2013-06-24 (×5): 40 mg via ORAL
  Filled 2013-06-19 (×5): qty 1

## 2013-06-19 MED ORDER — NITROGLYCERIN 0.4 MG SL SUBL: 0.4000 mg | SUBLINGUAL_TABLET | SUBLINGUAL | Status: DC | PRN

## 2013-06-19 MED ORDER — ALOGLIPTIN-METFORMIN HCL 12.5-500 MG PO TABS: 1.0000 | ORAL_TABLET | Freq: Every day | ORAL | Status: DC

## 2013-06-19 MED ORDER — ATORVASTATIN CALCIUM 40 MG PO TABS
40.0000 mg | ORAL_TABLET | Freq: Every day | ORAL | Status: DC
  Administered 2013-06-20 – 2013-06-23 (×4): 40 mg via ORAL
  Filled 2013-06-19 (×5): qty 1

## 2013-06-19 MED ORDER — CLORAZEPATE DIPOTASSIUM 3.75 MG PO TABS: 15.0000 mg | ORAL_TABLET | Freq: Two times a day (BID) | ORAL | Status: DC

## 2013-06-19 MED ORDER — WARFARIN SODIUM 2 MG PO TABS
2.0000 mg | ORAL_TABLET | ORAL | Status: DC
  Filled 2013-06-19: qty 1

## 2013-06-19 MED ORDER — INSULIN ASPART 100 UNIT/ML ~~LOC~~ SOLN: 5.0000 [IU] | Freq: Four times a day (QID) | SUBCUTANEOUS | Status: DC

## 2013-06-19 MED ORDER — SODIUM CHLORIDE 0.9 % IJ SOLN: 3.0000 mL | INTRAMUSCULAR | Status: DC | PRN

## 2013-06-19 MED ORDER — SODIUM CHLORIDE 0.9 % IJ SOLN
3.0000 mL | Freq: Two times a day (BID) | INTRAMUSCULAR | Status: DC
  Administered 2013-06-19 – 2013-06-23 (×8): 3 mL via INTRAVENOUS

## 2013-06-19 NOTE — H&P (Signed)
[name] is an 57 y.o. female.      Ref: Dr. Montez Morita Chief Complaint: Chest pain, leg swelling and shortness of breath. HPI: 57 years old female has 1 week history of chest pain, shortness of breath with activity. She has history of CHF and atrial fibrillation and was evaluated by Dr. Rayann Heman 2 months ago and by Dr. Terrence Dupont 4 months ago.  Past Medical History  Diagnosis Date  . HTN (hypertension)   . Chronic diastolic CHF (congestive heart failure)   . DM (diabetes mellitus)   . Hyperlipidemia   . Allergic rhinitis   . Sleep apnea     compliant with CPAP  . COPD (chronic obstructive pulmonary disease)   . GERD (gastroesophageal reflux disease)   . Headache(784.0)   . Arthritis   . Depression   . Obesity   . DVT (deep vein thrombosis) in pregnancy   . Dysrhythmia     atrial fibrilation  . Shortness of breath       Past Surgical History  Procedure Laterality Date  . Abdominal hysterectomy    . Diagnostic laparoscopy    . Knee surgery    . Cholecystectomy    . Ingrown hallux Left     Family History  Problem Relation Age of Onset  . CAD    . Cancer Mother   . Hypertension Mother   . Cancer Father   . Hypertension Sister   . Hypertension Brother    Social History:  reports that she has never smoked. She has never used smokeless tobacco. She reports that she does not drink alcohol or use illicit drugs.  Allergies:  Allergies  Allergen Reactions  . Sulfa Antibiotics Itching     (Not in a hospital admission)  Results for orders placed during the hospital encounter of 06/19/13 (from the past 48 hour(Gallagher))  CBC     Status: None   Collection Time    06/19/13  2:43 PM      Result Value Range   WBC 6.6  4.0 - 10.5 K/uL   RBC 4.41  3.87 - 5.11 MIL/uL   Hemoglobin 12.3  12.0 - 15.0 g/dL   HCT 38.1  36.0 - 46.0 %   MCV 86.4  78.0 - 100.0 fL   MCH 27.9  26.0 - 34.0 pg   MCHC 32.3  30.0 - 36.0 g/dL   RDW 14.8  11.5 - 15.5 %   Platelets 225  150 - 400 K/uL   POCT I-STAT TROPONIN I     Status: None   Collection Time    06/19/13  3:06 PM      Result Value Range   Troponin i, poc 0.00  0.00 - 0.08 ng/mL   Comment 3            Comment: Due to the release kinetics of cTnI,     a negative result within the first hours     of the onset of symptoms does not rule out     myocardial infarction with certainty.     If myocardial infarction is still suspected,     repeat the test at appropriate intervals.  PRO B NATRIURETIC PEPTIDE     Status: Abnormal   Collection Time    06/19/13  3:39 PM      Result Value Range   Pro B Natriuretic peptide (BNP) 2177.0 (*) 0 - 125 pg/mL  DIGOXIN LEVEL     Status: None   Collection Time  06/19/13  4:34 PM      Result Value Range   Digoxin Level 1.5  0.8 - 2.0 ng/mL  PROTIME-INR     Status: Abnormal   Collection Time    06/19/13  4:34 PM      Result Value Range   Prothrombin Time 35.9 (*) 11.6 - 15.2 seconds   INR 3.78 (*) 0.00 - 1.49  URINALYSIS, ROUTINE W REFLEX MICROSCOPIC     Status: None   Collection Time    06/19/13  5:48 PM      Result Value Range   Color, Urine YELLOW  YELLOW   APPearance CLEAR  CLEAR   Specific Gravity, Urine 1.009  1.005 - 1.030   pH 6.5  5.0 - 8.0   Glucose, UA NEGATIVE  NEGATIVE mg/dL   Hgb urine dipstick NEGATIVE  NEGATIVE   Bilirubin Urine NEGATIVE  NEGATIVE   Ketones, ur NEGATIVE  NEGATIVE mg/dL   Protein, ur NEGATIVE  NEGATIVE mg/dL   Urobilinogen, UA 0.2  0.0 - 1.0 mg/dL   Nitrite NEGATIVE  NEGATIVE   Leukocytes, UA NEGATIVE  NEGATIVE   Comment: MICROSCOPIC NOT DONE ON URINES WITH NEGATIVE PROTEIN, BLOOD, LEUKOCYTES, NITRITE, OR GLUCOSE <1000 mg/dL.   Dg Chest 2 View  06/19/2013   CLINICAL DATA:  Leg swelling. Shortness of breath. Chest pain. Hypertension.  EXAM: CHEST  2 VIEW  COMPARISON:  01/20/2013  FINDINGS: Moderate cardiomegaly well cephalization of blood flow, but without overt edema. No pleural effusion. Lungs otherwise clear.  IMPRESSION: 1. Moderate  cardiomegaly and pulmonary venous hypertension.   Electronically Signed   By: Sherryl Barters M.D.   On: 06/19/2013 15:47    ROS Constitutional: Negative for fever, chills and weight loss.  Eyes: Negative for blurred vision and double vision.  Respiratory: Positive for shortness of breath. Negative for cough, hemoptysis and sputum production.  Cardiovascular: Positive for chest pain. Negative for orthopnea and claudication.  Gastrointestinal: Positive for nausea. Negative for heartburn, vomiting and abdominal pain.  Neurological: Positive for dizziness.   Blood pressure 139/66, pulse 65, temperature 97.9 F (36.6 C), temperature source Oral, resp. rate 21, height 5\' 4"  (1.626 m), weight 205.933 kg (454 lb), SpO2 93.00%.  Physical Exam  Constitutional: She is oriented to person, place, and time.  HENT: Normocephalic and atraumatic. Brown eyes, Conjunctivae and EOM are normal. Pupils are equal, round, and reactive to light. No scleral icterus.  Neck: Normal range of motion. No JVD present. No tracheal deviation present. No thyromegaly present.  Cardiovascular: Irregularly irregular S1-S2,  soft 2/6 systolic murmur noted.  Respiratory: Effort normal and breath sounds normal. No respiratory distress. She has no wheezes. She has no rales.  GI: Soft and distended, non-tender. Bowel sounds are normal.   Musculoskeletal: She exhibits trace edema and tenderness over shins on palpation.  Neurological: She is alert and oriented to person, place, and time.  Skin: Wam and dry.  Assessment/Plan Chest pain rule out MI  A. fibrillation  Diabetes mellitus  Hypertension  COPD  Obstructive sleep apnea/obesity hypoventilation syndrome  Morbid obesity  Chronic kidney disease  History of DVT  Depression  Degenerative joint disease  R/O MI, Monitor for atrial fibrillation Dietary consult   [name] 06/19/2013, 6:31 PM

## 2013-06-19 NOTE — ED Notes (Signed)
Bedside commode placed at bedside pt has call light and informed to call when she needs assistance.

## 2013-06-19 NOTE — ED Provider Notes (Signed)
CSN: [id number]     Arrival date & time 06/19/13  1419 History   First MD Initiated Contact with Patient 06/19/13 1519     Chief Complaint  Patient presents with  . Leg Swelling  . Shortness of Breath  . Chest Pain   (Consider location/radiation/quality/duration/timing/severity/associated sxs/prior Treatment) The history is provided by the patient. No language interpreter was used.   Patient is a 57 y.o. African American female with past medical history morbid obesity, CHF, atrial fibrillation who comes emergency department today with shortness of breath and lower extremity edema. She has had decreased exercise tolerance over the past week. She's become significantly more short of breath normally she is able to ambulate and house without shortness of breath however with the past week she cannot even ambulate from chair to chair without becoming short of breath. She has a home health aide who provides care for her. The home health aid today was concerned that she was significantly more edematous than usual. He discussed her care with her primary cardiologist is advised to the emergency department for evaluation for congestive heart failure exacerbation. She does complain of chest pain associated with shortness of breath however this is similar in character to chest pain which his head to evaluate for the past. She rates the pain at a 7/10 which hasn't. It was with movement of her arms with palpation of her chest.   Past Medical History  Diagnosis Date  . HTN (hypertension)   . Chronic diastolic CHF (congestive heart failure)   . DM (diabetes mellitus)   . Hyperlipidemia   . Allergic rhinitis   . Sleep apnea     compliant with CPAP  . COPD (chronic obstructive pulmonary disease)   . GERD (gastroesophageal reflux disease)   . Headache(784.0)   . Arthritis   . Depression   . Obesity   . DVT (deep vein thrombosis) in pregnancy   . Dysrhythmia     atrial fibrilation  . Shortness of breath     Past Surgical History  Procedure Laterality Date  . Abdominal hysterectomy    . Diagnostic laparoscopy    . Knee surgery    . Cholecystectomy    . Ingrown hallux Left    Family History  Problem Relation Age of Onset  . CAD    . Cancer Mother   . Hypertension Mother   . Cancer Father   . Hypertension Sister   . Hypertension Brother    History  Substance Use Topics  . Smoking status: Never Smoker   . Smokeless tobacco: Never Used  . Alcohol Use: No   OB History   Grav Para Term Preterm Abortions TAB SAB Ect Mult Living                 Review of Systems  Constitutional: Negative for fever and chills.  Respiratory: Positive for shortness of breath. Negative for cough.   Cardiovascular: Positive for leg swelling.  Gastrointestinal: Negative for nausea, vomiting, abdominal pain, diarrhea and constipation.  Genitourinary: Negative for dysuria, urgency and frequency.  All other systems reviewed and are negative.    Allergies  Sulfa antibiotics  Home Medications   No current outpatient prescriptions on file. BP 136/80  Pulse 74  Temp(Src) 97.7 F (36.5 C) (Oral)  Resp 18  Ht 5\' 4"  (1.626 m)  Wt 454 lb (205.933 kg)  BMI 77.89 kg/m2  SpO2 96% Physical Exam  Nursing note and vitals reviewed. Constitutional: She is oriented to person, place,  and time. She appears well-developed and well-nourished. No distress.  Morbid obesity.  HENT:  Head: Normocephalic and atraumatic.  Eyes: Pupils are equal, round, and reactive to light.  Neck: Normal range of motion.  Cardiovascular: Normal rate, regular rhythm, normal heart sounds and intact distal pulses.   Pulmonary/Chest: Effort normal. No respiratory distress. She has no wheezes. She has no rales. She exhibits no tenderness.  Abdominal: Soft. Bowel sounds are normal. She exhibits no distension. There is no tenderness. There is no rebound and no guarding.  Neurological: She is alert and oriented to person, place, and  time. She has normal strength. No cranial nerve deficit or sensory deficit. She exhibits normal muscle tone. Coordination and gait normal.  Skin: Skin is warm and dry.    ED Course  Procedures (including critical care time) Labs Review Labs Reviewed  PRO B NATRIURETIC PEPTIDE - Abnormal; Notable for the following:    Pro B Natriuretic peptide (BNP) 2177.0 (*)    All other components within normal limits  PROTIME-INR - Abnormal; Notable for the following:    Prothrombin Time 35.9 (*)    INR 3.78 (*)    All other components within normal limits  GLUCOSE, CAPILLARY - Abnormal; Notable for the following:    Glucose-Capillary 113 (*)    All other components within normal limits  URINE CULTURE  CBC  DIGOXIN LEVEL  URINALYSIS, ROUTINE W REFLEX MICROSCOPIC  TROPONIN I  PROTIME-INR  HEMOGLOBIN A1C  DIGOXIN LEVEL  TROPONIN I  TROPONIN I  CBC  BASIC METABOLIC PANEL  LIPID PANEL  POCT I-STAT TROPONIN I   Imaging Review Dg Chest 2 View  06/19/2013   CLINICAL DATA:  Leg swelling. Shortness of breath. Chest pain. Hypertension.  EXAM: CHEST  2 VIEW  COMPARISON:  01/20/2013  FINDINGS: Moderate cardiomegaly well cephalization of blood flow, but without overt edema. No pleural effusion. Lungs otherwise clear.  IMPRESSION: 1. Moderate cardiomegaly and pulmonary venous hypertension.   Electronically Signed   By: Sherryl Barters M.D.   On: 06/19/2013 15:47    EKG Interpretation     Ventricular Rate:  68 PR Interval:    QRS Duration: 82 QT Interval:  360 QTC Calculation: 382 R Axis:   124 Text Interpretation:  Atrial fibrillation Low voltage QRS Left posterior fascicular block Possible Inferior infarct , age undetermined Cannot rule out Anterior infarct , age undetermined Abnormal ECG No significant change since last tracing            MDM  Patient is a 57 year old  American female who comes emergency department if shortness of breath. Physical exam as above. Initial workup  included a troponin, UA, digoxin level, PT INR, BNP, CBC, chest x-ray, and EKG. EKG demonstrated atrial fibrillation as demonstrated above. I-STAT troponin was negative. CBC was unremarkable. BMP was elevated to 2177 significantly higher than baseline of 300. INR was 3.78. Digoxin level was 1.5. UA was unremarkable. Chest x-ray demonstrated only venous hypertension. This is concerning for a CHF exacerbation. Patient was felt to require admission to the hospital for her exacerbations and she is unable to provide care for self at home and has been noncompliant in the past. She is admitted to the cardiology service in stable condition. Labs and imaging reviewed by myself and considered and medical decision-making. Imaging was interpreted by radiology. Care was discussed with my attending Dr. Alvino Chapel.    1. Acute exacerbation of CHF (congestive heart failure)   2. Morbid obesity   3. Atrial fibrillation  Katheren Shams, MD 06/20/13 [phone]

## 2013-06-19 NOTE — ED Notes (Signed)
Pt was sent here by here home healthcare RN for fluid retention.  Hx of chf and a-fib. Pt also c/o increased sob on ambulation, chest pain and lack of appetite x 1 week.

## 2013-06-20 ENCOUNTER — Other Ambulatory Visit: Payer: Self-pay

## 2013-06-20 LAB — BASIC METABOLIC PANEL
BUN: 13 mg/dL (ref 6–23)
CO2: 27 mEq/L (ref 19–32)
Calcium: 9.4 mg/dL (ref 8.4–10.5)
Chloride: 105 mEq/L (ref 96–112)
Creatinine, Ser: 1.42 mg/dL — ABNORMAL HIGH (ref 0.50–1.10)
GFR calc Af Amer: 47 mL/min — ABNORMAL LOW (ref >90)
GFR calc non Af Amer: 40 mL/min — ABNORMAL LOW (ref >90)
Glucose, Bld: 89 mg/dL (ref 70–99)
Potassium: 3.9 mEq/L (ref 3.5–5.1)
Sodium: 142 mEq/L (ref 135–145)

## 2013-06-20 LAB — HEMOGLOBIN A1C
Hgb A1c MFr Bld: 7.3 % — ABNORMAL HIGH (ref <5.7)
Mean Plasma Glucose: 163 mg/dL — ABNORMAL HIGH (ref <117)

## 2013-06-20 LAB — LIPID PANEL
Cholesterol: 82 mg/dL (ref 0–200)
HDL: 29 mg/dL — ABNORMAL LOW (ref >39)
LDL Cholesterol: 39 mg/dL (ref 0–99)
Total CHOL/HDL Ratio: 2.8 RATIO
Triglycerides: 68 mg/dL (ref <150)
VLDL: 14 mg/dL (ref 0–40)

## 2013-06-20 LAB — URINE CULTURE
Colony Count: NO GROWTH
Culture: NO GROWTH

## 2013-06-20 LAB — TROPONIN I
Troponin I: <0.3 ng/mL (ref <0.30)
Troponin I: <0.3 ng/mL (ref <0.30)

## 2013-06-20 LAB — GLUCOSE, CAPILLARY
Glucose-Capillary: 101 mg/dL — ABNORMAL HIGH (ref 70–99)
Glucose-Capillary: 104 mg/dL — ABNORMAL HIGH (ref 70–99)
Glucose-Capillary: 128 mg/dL — ABNORMAL HIGH (ref 70–99)
Glucose-Capillary: 154 mg/dL — ABNORMAL HIGH (ref 70–99)
Glucose-Capillary: 72 mg/dL (ref 70–99)
Glucose-Capillary: 91 mg/dL (ref 70–99)
Glucose-Capillary: 93 mg/dL (ref 70–99)

## 2013-06-20 LAB — DIGOXIN LEVEL: Digoxin Level: 1.1 ng/mL (ref 0.8–2.0)

## 2013-06-20 LAB — CBC
HCT: 36.6 % (ref 36.0–46.0)
Hemoglobin: 11.9 g/dL — ABNORMAL LOW (ref 12.0–15.0)
MCH: 27.8 pg (ref 26.0–34.0)
MCHC: 32.5 g/dL (ref 30.0–36.0)
MCV: 85.5 fL (ref 78.0–100.0)
Platelets: 220 10*3/uL (ref 150–400)
RBC: 4.28 MIL/uL (ref 3.87–5.11)
RDW: 15 % (ref 11.5–15.5)
WBC: 7.3 10*3/uL (ref 4.0–10.5)

## 2013-06-20 LAB — PROTIME-INR
INR: 3.71 — ABNORMAL HIGH (ref 0.00–1.49)
Prothrombin Time: 35.4 seconds — ABNORMAL HIGH (ref 11.6–15.2)

## 2013-06-20 MED ORDER — WARFARIN SODIUM 2 MG PO TABS
2.0000 mg | ORAL_TABLET | ORAL | Status: DC
  Filled 2013-06-20: qty 1

## 2013-06-20 NOTE — Progress Notes (Signed)
Subjective:  Good diuresis yesterday. T max 98.4 F. Decreasing leg edema and shortness of breath. Troponin I negative x 3.  Objective:  Vital Signs in the last 24 hours: Temp:  [97.4 F (36.3 C)-98.4 F (36.9 C)] 97.4 F (36.3 C) (10/31 1508) Pulse Rate:  [50-84] 77 (10/31 1508) Cardiac Rhythm:  [-] Atrial fibrillation (10/31 0800) Resp:  [16-30] 16 (10/31 1508) BP: (105-149)/(60-85) 110/65 mmHg (10/31 1508) SpO2:  [91 %-100 %] 99 % (10/31 1508) FiO2 (%):  [21 %] 21 % (10/30 2018) Weight:  [177.629 kg (391 lb 9.6 oz)] 177.629 kg (391 lb 9.6 oz) (10/30 2010)  Physical Exam: BP Readings from Last 1 Encounters:  06/20/13 110/65     Wt Readings from Last 1 Encounters:  06/19/13 177.629 kg (391 lb 9.6 oz)    Weight change:   HEENT: [place]/AT, Eyes-Brown, PERL, EOMI, Conjunctiva-Pink, Sclera-Non-icteric Neck: No JVD, No bruit, Trachea midline. Lungs:  Clear, Bilateral. Cardiac:  Regular rhythm, normal S1 and S2, no S3.  Abdomen:  Soft, non-tender. Extremities:  Trace edema present. No cyanosis. No clubbing. CNS: AxOx3, Cranial nerves grossly intact, moves all 4 extremities. Right handed. Skin: Warm and dry.   Intake/Output from previous day: 10/30 0701 - 10/31 0700 In: 355 [P.O.:355] Out: 5400 [Urine:5400]    Lab Results: BMET    Component Value Date/Time   NA 142 06/20/2013 0238   K 3.9 06/20/2013 0238   CL 105 06/20/2013 0238   CO2 27 06/20/2013 0238   GLUCOSE 89 06/20/2013 0238   BUN 13 06/20/2013 0238   CREATININE 1.42* 06/20/2013 0238   CALCIUM 9.4 06/20/2013 0238   GFRNONAA 40* 06/20/2013 0238   GFRAA 47* 06/20/2013 0238   CBC    Component Value Date/Time   WBC 7.3 06/20/2013 0238   RBC 4.28 06/20/2013 0238   HGB 11.9* 06/20/2013 0238   HCT 36.6 06/20/2013 0238   PLT 220 06/20/2013 0238   MCV 85.5 06/20/2013 0238   MCH 27.8 06/20/2013 0238   MCHC 32.5 06/20/2013 0238   RDW 15.0 06/20/2013 0238   LYMPHSABS 3.3 01/20/2013 1628   MONOABS 0.8 01/20/2013 1628    EOSABS 0.2 01/20/2013 1628   BASOSABS 0.0 01/20/2013 1628   CARDIAC ENZYMES Lab Results  Component Value Date   CKTOTAL 118 02/27/2012   CKMB 1.1 02/27/2012   TROPONINI <0.30 06/20/2013    Scheduled Meds: . atorvastatin  40 mg Oral q1800  . carvedilol  18.75 mg Oral Daily  . clorazepate  15 mg Oral BID  . digoxin  0.25 mg Oral Daily  . furosemide  20 mg Oral Daily  . gabapentin  300 mg Oral QHS  . insulin aspart  5-12 Units Subcutaneous Q6H  . insulin glargine  40 Units Subcutaneous BID  . losartan  50 mg Oral Daily  . olopatadine  1 drop Both Eyes BID  . pantoprazole  40 mg Oral Daily  . potassium chloride SA  20 mEq Oral BID  . sodium chloride  3 mL Intravenous Q12H  . spironolactone  12.5 mg Oral Daily  . [START ON 06/21/2013] warfarin  2 mg Oral Custom  . Warfarin - Physician Dosing Inpatient   Does not apply q1800   Continuous Infusions:  PRN Meds:.sodium chloride, acetaminophen, albuterol, nitroGLYCERIN, ondansetron (ZOFRAN) IV, oxyCODONE-acetaminophen, sodium chloride  Assessment/Plan: Chest pain MI ruled out  A. fibrillation  Diabetes mellitus, II  Hypertension  COPD  Obstructive sleep apnea/obesity hypoventilation syndrome  Morbid obesity  Chronic kidney disease  History  of DVT  Depression  Degenerative joint disease  Continue medical treatment.   LOS: 1 day    Dixie Dials  MD  06/20/2013, 6:24 PM

## 2013-06-20 NOTE — Care Management Note (Signed)
Patient is currently active with [facility]. RN/PT/HHA/SW. Resumption of Care orders requested at time of discharge.

## 2013-06-20 NOTE — Plan of Care (Signed)
Problem: Food- and Nutrition-Related Knowledge Deficit (NB-1.1) Goal: Nutrition education Formal process to instruct or train a patient/client in a skill or to impart knowledge to help patients/clients voluntarily manage or modify food choices and eating behavior to maintain or improve health. Outcome: Completed/Met Date Met:  06/20/13  RD consulted for nutrition education regarding diabetes and morbid obesity.     Lab Results  Component Value Date    HGBA1C 11.4* 01/20/2013    RD provided "Carbohydrate Counting for People with Diabetes" handout from the Academy of Nutrition and Dietetics. Discussed different food groups and their effects on blood sugar, emphasizing carbohydrate-containing foods. Provided list of carbohydrates and recommended serving sizes of common foods.  Discussed importance of controlled and consistent carbohydrate intake throughout the day. Provided examples of ways to balance meals/snacks and encouraged intake of high-fiber, whole grain complex carbohydrates. Teach back method used.  Expect fair to good compliance. Will order OP diabetes diet education for patient.  Body mass index is 67.19 kg/(m^2). Pt meets criteria for class 3, extreme/morbid obesity based on current BMI.  Current diet order is CHO-modified medium, patient is consuming approximately 50% of meals at this time. Labs and medications reviewed. No further nutrition interventions warranted at this time. RD contact information provided. If additional nutrition issues arise, please re-consult RD.  Molli Barrows, RD, LDN, [place] Pager [phone] After Hours Pager [phone]

## 2013-06-20 NOTE — Care Management Note (Addendum)
  Page 2 of 2   06/20/2013     3:17:57 PM   CARE MANAGEMENT NOTE 06/20/2013  Patient:  [name], [name]   Account Number:  [id number]  Date Initiated:  06/20/2013  Documentation initiated by:  GRAVES-BIGELOW,Margaretann Abate  Subjective/Objective Assessment:   Pt admitted for  Chest pain, leg swelling and shortness of breath. Pt is from home with family and plans to retrun home wit [facility] services. Pt is active with [facility] RN/PT/HHA/SW.     Action/Plan:   MD please write resumption orders at d/c for services. Weekend CM to assist with referral.  Pt has DME Kasandra Knudsen, [place], [employer]. No DME needs at this time.   Anticipated DC Date:  06/21/2013   Anticipated DC Plan:  [place]  CM consult      [facility] Choice  HOME HEALTH  Resumption Of Svcs/PTA Provider   Choice offered to / List presented to:  C-1 Patient        [place] arranged  HH-1 RN  [place]      [facility]   Status of service:  Completed, signed off Medicare Important Message given?   (If response is "NO", the following Medicare IM given date fields will be blank) Date Medicare IM given:   Date Additional Medicare IM given:    Discharge Disposition:  [place]  Per UR Regulation:  Reviewed for med. necessity/level of care/duration of stay  If discussed at [place] of Stay Meetings, dates discussed:    Comments:

## 2013-06-20 NOTE — Progress Notes (Signed)
Pt's cbg 72. Pt given 8 oz of OJ. CBG re-checked and was 102. Will continue to monitor the pt. Hoover Brunette, RN

## 2013-06-21 LAB — PROTIME-INR
INR: 3.21 — ABNORMAL HIGH (ref 0.00–1.49)
Prothrombin Time: 31.7 seconds — ABNORMAL HIGH (ref 11.6–15.2)

## 2013-06-21 LAB — GLUCOSE, CAPILLARY
Glucose-Capillary: 102 mg/dL — ABNORMAL HIGH (ref 70–99)
Glucose-Capillary: 103 mg/dL — ABNORMAL HIGH (ref 70–99)
Glucose-Capillary: 116 mg/dL — ABNORMAL HIGH (ref 70–99)
Glucose-Capillary: 137 mg/dL — ABNORMAL HIGH (ref 70–99)
Glucose-Capillary: 92 mg/dL (ref 70–99)

## 2013-06-21 MED ORDER — WARFARIN - PHARMACIST DOSING INPATIENT: Freq: Every day | Status: DC

## 2013-06-21 NOTE — Progress Notes (Signed)
  Echocardiogram 2D Echocardiogram has been performed.  [place], [place] 06/21/2013, 3:06 PM

## 2013-06-21 NOTE — Progress Notes (Signed)
ANTICOAGULATION CONSULT NOTE - Follow Up Consult  Pharmacy Consult for Warfarin Indication: atrial fibrillation  Allergies  Allergen Reactions  . Sulfa Antibiotics Itching    Patient Measurements: Height: 5\' 4"  (162.6 cm) Weight: 391 lb 3.2 oz (177.447 kg) IBW/kg (Calculated) : 54.7   Vital Signs: Temp: 97.7 F (36.5 C) (11/01 1337) Temp src: Oral (11/01 1337) BP: 110/60 mmHg (11/01 1337) Pulse Rate: 78 (11/01 1337)  Labs:  Recent Labs  06/19/13 1443 06/19/13 1634 06/19/13 2040 06/20/13 0238 06/20/13 0900 06/21/13 0519  HGB 12.3  --   --  11.9*  --   --   HCT 38.1  --   --  36.6  --   --   PLT 225  --   --  220  --   --   LABPROT  --  35.9*  --  35.4*  --  31.7*  INR  --  3.78*  --  3.71*  --  3.21*  CREATININE  --   --   --  1.42*  --   --   TROPONINI  --   --  <0.30 <0.30 <0.30  --     Estimated Creatinine Clearance: 72.5 ml/min (by C-G formula based on Cr of 1.42).   Medications:  Scheduled:  . atorvastatin  40 mg Oral q1800  . carvedilol  18.75 mg Oral Daily  . clorazepate  15 mg Oral BID  . digoxin  0.25 mg Oral Daily  . furosemide  20 mg Oral Daily  . gabapentin  300 mg Oral QHS  . insulin aspart  5-12 Units Subcutaneous Q6H  . insulin glargine  40 Units Subcutaneous BID  . losartan  50 mg Oral Daily  . olopatadine  1 drop Both Eyes BID  . pantoprazole  40 mg Oral Daily  . potassium chloride SA  20 mEq Oral BID  . sodium chloride  3 mL Intravenous Q12H  . spironolactone  12.5 mg Oral Daily  . Warfarin - Pharmacist Dosing Inpatient   Does not apply q1800    Assessment: 57 yo female with 1 week history of chest pain and shortness of breath with activity. MD was dosing warfarin for atrial fibrillation, but has now asked pharmacy to dose. INR 3.21. No S/S of bleeding.   Warfarin home schedule: 2mg  all days except none on Sunday.  Goal of Therapy:  INR 2-3 Monitor platelets by anticoagulation protocol: Yes   Plan:  - Hold warfarin dose  today - Follow-up daily CBC and INR - Monitor for S/S of bleeding  Cord Wilczynski A. Pincus Badder, PharmD Clinical Pharmacist - Resident Pager: [phone] Pharmacy: [phone] 06/21/2013 3:48 PM

## 2013-06-21 NOTE — ED Provider Notes (Signed)
I saw and evaluated the patient, reviewed the resident's note and I agree with the findings and plan.  EKG Interpretation     Ventricular Rate:  68 PR Interval:    QRS Duration: 82 QT Interval:  360 QTC Calculation: 382 R Axis:   124 Text Interpretation:  Atrial fibrillation Low voltage QRS Left posterior fascicular block Possible Inferior infarct , age undetermined Cannot rule out Anterior infarct , age undetermined Abnormal ECG No significant change since last tracing            Patient with shortness of breath. History of COPD and CHF. And likely a component of both. Will admit the patient's cardiologist.  Jasper Riling. Alvino Chapel, MD 06/21/13 [phone]

## 2013-06-21 NOTE — Progress Notes (Signed)
Subjective:  Patient denies any anginal chest pain states breathing is improved. And leg swelling also has improved. Denies any fever or chills  Objective:  Vital Signs in the last 24 hours: Temp:  [97.4 F (36.3 C)-97.9 F (36.6 C)] 97.7 F (36.5 C) (11/01 0658) Pulse Rate:  [69-84] 69 (11/01 0658) Resp:  [16-18] 18 (11/01 0658) BP: (110-133)/(60-69) 130/69 mmHg (11/01 0658) SpO2:  [97 %-99 %] 97 % (11/01 0658) Weight:  [177.447 kg (391 lb 3.2 oz)] 177.447 kg (391 lb 3.2 oz) (11/01 0658)  Intake/Output from previous day: 10/31 0701 - 11/01 0700 In: 783 [P.O.:780; I.V.:3] Out: 1500 [Urine:1500] Intake/Output from this shift:    Physical Exam: Neck: no adenopathy, no carotid bruit, no JVD and supple, symmetrical, trachea midline Lungs: Decreased breath sound at bases Heart: irregularly irregular rhythm, S1, S2 normal and Soft systolic murmur noted Abdomen: soft, non-tender; bowel sounds normal; no masses,  no organomegaly Extremities: No clubbing cyanosis generalized edema noted  Lab Results:  Recent Labs  06/19/13 1443 06/20/13 0238  WBC 6.6 7.3  HGB 12.3 11.9*  PLT 225 220    Recent Labs  06/20/13 0238  NA 142  K 3.9  CL 105  CO2 27  GLUCOSE 89  BUN 13  CREATININE 1.42*    Recent Labs  06/20/13 0238 06/20/13 0900  TROPONINI <0.30 <0.30   Hepatic Function Panel No results found for this basename: PROT, ALBUMIN, AST, ALT, ALKPHOS, BILITOT, BILIDIR, IBILI,  in the last 72 hours  Recent Labs  06/20/13 0238  CHOL 82   No results found for this basename: PROTIME,  in the last 72 hours  Imaging: Imaging results have been reviewed and Dg Chest 2 View  06/19/2013   CLINICAL DATA:  Leg swelling. Shortness of breath. Chest pain. Hypertension.  EXAM: CHEST  2 VIEW  COMPARISON:  01/20/2013  FINDINGS: Moderate cardiomegaly well cephalization of blood flow, but without overt edema. No pleural effusion. Lungs otherwise clear.  IMPRESSION: 1. Moderate  cardiomegaly and pulmonary venous hypertension.   Electronically Signed   By: Sherryl Barters M.D.   On: 06/19/2013 15:47    Cardiac Studies:  Assessment/Plan:  Status post chest pain MI ruled out A. fibrillation  Diabetes mellitus, II  Hypertension  COPD  Obstructive sleep apnea/obesity hypoventilation syndrome  Morbid obesity  Chronic kidney disease  History of DVT  Depression  Degenerative joint disease Plan As per orders OT PT consult Increase ambulation  LOS: 2 days    [name] 06/21/2013, 9:11 AM

## 2013-06-21 NOTE — Progress Notes (Signed)
RN called RT to room to setup CPAP for Patient.  Auto CPAP ready for use.  RN to place on after Pt uses restroom.  RT to monitor and assess as needed.

## 2013-06-21 NOTE — Progress Notes (Signed)
Interdry ordered & placed in folds in both legs. Lt leg had foul odor with drainage. Hoover Brunette, RN

## 2013-06-22 LAB — CBC
HCT: 37.2 % (ref 36.0–46.0)
Hemoglobin: 11.8 g/dL — ABNORMAL LOW (ref 12.0–15.0)
MCH: 27.3 pg (ref 26.0–34.0)
MCHC: 31.7 g/dL (ref 30.0–36.0)
MCV: 86.1 fL (ref 78.0–100.0)
Platelets: 222 10*3/uL (ref 150–400)
RBC: 4.32 MIL/uL (ref 3.87–5.11)
RDW: 15.4 % (ref 11.5–15.5)
WBC: 6.5 10*3/uL (ref 4.0–10.5)

## 2013-06-22 LAB — BASIC METABOLIC PANEL
BUN: 12 mg/dL (ref 6–23)
CO2: 25 mEq/L (ref 19–32)
Calcium: 8.8 mg/dL (ref 8.4–10.5)
Chloride: 104 mEq/L (ref 96–112)
Creatinine, Ser: 1.34 mg/dL — ABNORMAL HIGH (ref 0.50–1.10)
GFR calc Af Amer: 50 mL/min — ABNORMAL LOW (ref >90)
GFR calc non Af Amer: 43 mL/min — ABNORMAL LOW (ref >90)
Glucose, Bld: 91 mg/dL (ref 70–99)
Potassium: 4.2 mEq/L (ref 3.5–5.1)
Sodium: 138 mEq/L (ref 135–145)

## 2013-06-22 LAB — PRO B NATRIURETIC PEPTIDE: Pro B Natriuretic peptide (BNP): 1294 pg/mL — ABNORMAL HIGH (ref 0–125)

## 2013-06-22 LAB — GLUCOSE, CAPILLARY
Glucose-Capillary: 90 mg/dL (ref 70–99)
Glucose-Capillary: 134 mg/dL — ABNORMAL HIGH (ref 70–99)
Glucose-Capillary: 111 mg/dL — ABNORMAL HIGH (ref 70–99)
Glucose-Capillary: 146 mg/dL — ABNORMAL HIGH (ref 70–99)

## 2013-06-22 LAB — PROTIME-INR
INR: 2.81 — ABNORMAL HIGH (ref 0.00–1.49)
Prothrombin Time: 28.6 seconds — ABNORMAL HIGH (ref 11.6–15.2)

## 2013-06-22 MED ORDER — WARFARIN SODIUM 1 MG PO TABS
1.0000 mg | ORAL_TABLET | Freq: Once | ORAL | Status: AC
  Administered 2013-06-22: 1 mg via ORAL
  Filled 2013-06-22: qty 1

## 2013-06-22 NOTE — Evaluation (Signed)
Physical Therapy Evaluation Patient Details Name: [name] MRN: [id number] DOB: 02/19/1956 Today's Date: 06/22/2013 Time: [id number] PT Time Calculation (min): 21 min  PT Assessment / Plan / Recommendation History of Present Illness  57 y.o. female admitted to [facility] on 06/19/13 with 1 week history of chest pain, shortness of breath with activity.  MI ruled out.  Pt being diuresed for volume overload and CHF exacerbation.    Clinical Impression  Pt is doing well with mobility.  She is stiff and sore from being in the bed most of the day.  She is appropriate to resume her [facility] therapies (she had both PT and OT PTA) at discharge.   PT to follow acutely for deficits listed below.       PT Assessment  Patient needs continued PT services    Follow Up Recommendations  Home health PT;Supervision for mobility/OOB (resume HH therapies already in place)    Does the patient have the potential to tolerate intense rehabilitation     NA  Barriers to Discharge   None      Equipment Recommendations  None recommended by PT    Recommendations for Other Services   None  Frequency Min 3X/week    Precautions / Restrictions   None  Pertinent Vitals/Pain HR 81-112 with mobility.  No DOE with walking.        Mobility  Bed Mobility Bed Mobility: Supine to Sit;Sitting - Scoot to Edge of Bed Supine to Sit: 6: Modified independent (Device/Increase time);With rails;HOB elevated Sitting - Scoot to Edge of Bed: 6: Modified independent (Device/Increase time);With rail Details for Bed Mobility Assistance: used railing and HOB to get to EOB.  Extra time needed to manage legs- per pt report they are still more swollen than usual.   Transfers Transfers: Sit to Stand;Stand to Sit Sit to Stand: 5: Supervision;With upper extremity assist;From bed Stand to Sit: 5: Supervision;With upper extremity assist;With armrests;To chair/3-in-1 Details for Transfer Assistance: supervision for safety due to extra  time and momentum needed to get to standing.   Ambulation/Gait Ambulation/Gait Assistance: 4: Min guard Ambulation Distance (Feet): 100 Feet Assistive device: Straight cane Ambulation/Gait Assistance Details: min guard assist for safety as pt is reaching with her free hand for the railing in the hallway.  Her other hand she has the cane.  She declined wanting me to find one of our RWs for her to use.   Gait Pattern: Wide base of support Gait velocity: decreased General Gait Details: No DOE with gait. HR 81-112 bpm        PT Diagnosis: Difficulty walking;Abnormality of gait;Generalized weakness  PT Problem List: Decreased strength;Decreased activity tolerance;Decreased balance;Decreased mobility;Obesity;Pain PT Treatment Interventions: Gait training;DME instruction;Stair training;Therapeutic activities;Functional mobility training;Therapeutic exercise;Balance training;Neuromuscular re-education;Patient/family education;Modalities     PT Goals(Current goals can be found in the care plan section) Acute Rehab PT Goals Patient Stated Goal: to get better, get the fulid off and go home PT Goal Formulation: With patient Time For Goal Achievement: 07/06/13 Potential to Achieve Goals: Good  Visit Information  Last PT Received On: 06/22/13 Assistance Needed: +1 History of Present Illness: 57 y.o. female admitted to [facility] on 06/19/13 with 1 week history of chest pain, shortness of breath with activity.  MI ruled out.  Pt being diuresed for volume overload and CHF exacerbation.         Prior [place] expects to be discharged to:: Private residence Living Arrangements: Spouse/significant other;Children Available Help at Discharge: Family;Available 24 hours/day  Type of Home: House Home Access: Stairs to enter [employer] of Steps: 2 Entrance Stairs-Rails: None Home Layout: One level Home Equipment: Walker - 2 wheels;Cane - single point Additional  Comments: per pt report she is moving to a smaller place soon.  "down sizing" since her children no longer live at home Prior Function Level of Independence: Independent with assistive device(s) Comments: uses cane to walk or walker if she is not feeling well.  Communication Communication: No difficulties Dominant Hand: Right    Cognition  Cognition Arousal/Alertness: Awake/alert Behavior During Therapy: WFL for tasks assessed/performed Overall Cognitive Status: Within Functional Limits for tasks assessed    Extremity/Trunk Assessment Upper Extremity Assessment Upper Extremity Assessment: Defer to OT evaluation Lower Extremity Assessment Lower Extremity Assessment: Generalized weakness Cervical / Trunk Assessment Cervical / Trunk Assessment: Normal      End of Session PT - End of Session Activity Tolerance: Patient limited by fatigue;Patient limited by pain Patient left: in chair;with call bell/phone within reach Nurse Communication: Mobility status    Wells Guiles B. Samah Lapiana, PT, DPT [phone]   06/22/2013, 10:22 AM

## 2013-06-22 NOTE — Progress Notes (Signed)
Subjective:  Patient denies any chest pain states breathing has improved. Patient walked earlier in the hallway complains of leg pain Overall feels better Objective:  Vital Signs in the last 24 hours: Temp:  [97.7 F (36.5 C)-98 F (36.7 C)] 97.9 F (36.6 C) (11/02 0520) Pulse Rate:  [71-82] 82 (11/02 1042) Resp:  [16-20] 17 (11/02 0520) BP: (110-130)/(60-71) 130/70 mmHg (11/02 1041) SpO2:  [97 %-98 %] 98 % (11/02 0520)  Intake/Output from previous day: 11/01 0701 - 11/02 0700 In: 1200 [P.O.:1200] Out: 1452 [Urine:1451; Stool:1] Intake/Output from this shift: Total I/O In: 360 [P.O.:360] Out: 450 [Urine:450]  Physical Exam: Neck: no adenopathy, no carotid bruit, no JVD and supple, symmetrical, trachea midline Lungs: Decreased breath sound at bases air entry improved Heart: irregularly irregular rhythm, S1, S2 normal and Soft systolic murmur noted Abdomen: soft, non-tender; bowel sounds normal; no masses,  no organomegaly Extremities: extremities normal, atraumatic, no cyanosis or edema  Lab Results:  Recent Labs  06/20/13 0238 06/22/13 0425  WBC 7.3 6.5  HGB 11.9* 11.8*  PLT 220 222    Recent Labs  06/20/13 0238 06/22/13 0425  NA 142 138  K 3.9 4.2  CL 105 104  CO2 27 25  GLUCOSE 89 91  BUN 13 12  CREATININE 1.42* 1.34*    Recent Labs  06/20/13 0238 06/20/13 0900  TROPONINI <0.30 <0.30   Hepatic Function Panel No results found for this basename: PROT, ALBUMIN, AST, ALT, ALKPHOS, BILITOT, BILIDIR, IBILI,  in the last 72 hours  Recent Labs  06/20/13 0238  CHOL 82   No results found for this basename: PROTIME,  in the last 72 hours  Imaging: Imaging results have been reviewed and No results found.  Cardiac Studies:  Assessment/Plan:  Status post chest pain MI ruled out  A. fibrillation  Diabetes mellitus, II  Hypertension  COPD  Obstructive sleep apnea/obesity hypoventilation syndrome  Morbid obesity  Chronic kidney disease  History of  DVT  Depression  Degenerative joint disease  Plan Continue present management Increase ambulation Dr. Doylene Canard to follow in a.m.  LOS: 3 days    [name] 06/22/2013, 11:24 AM

## 2013-06-22 NOTE — Progress Notes (Signed)
ANTICOAGULATION CONSULT NOTE - Follow Up Consult  Pharmacy Consult for Warfarin Indication: atrial fibrillation  Allergies  Allergen Reactions  . Sulfa Antibiotics Itching    Patient Measurements: Height: 5\' 4"  (162.6 cm) Weight: 391 lb 3.2 oz (177.447 kg) IBW/kg (Calculated) : 54.7   Vital Signs: Temp: 97.9 F (36.6 C) (11/02 0520) Temp src: Oral (11/02 0520) BP: 130/70 mmHg (11/02 1041) Pulse Rate: 82 (11/02 1042)  Labs:  Recent Labs  06/19/13 1443  06/19/13 2040 06/20/13 0238 06/20/13 0900 06/21/13 0519 06/22/13 0425  HGB 12.3  --   --  11.9*  --   --  11.8*  HCT 38.1  --   --  36.6  --   --  37.2  PLT 225  --   --  220  --   --  222  LABPROT  --   < >  --  35.4*  --  31.7* 28.6*  INR  --   < >  --  3.71*  --  3.21* 2.81*  CREATININE  --   --   --  1.42*  --   --  1.34*  TROPONINI  --   --  <0.30 <0.30 <0.30  --   --   < > = values in this interval not displayed.  Estimated Creatinine Clearance: 76.8 ml/min (by C-G formula based on Cr of 1.34).   Medications:  Scheduled:  . atorvastatin  40 mg Oral q1800  . carvedilol  18.75 mg Oral Daily  . clorazepate  15 mg Oral BID  . digoxin  0.25 mg Oral Daily  . furosemide  20 mg Oral Daily  . gabapentin  300 mg Oral QHS  . insulin aspart  5-12 Units Subcutaneous Q6H  . insulin glargine  40 Units Subcutaneous BID  . losartan  50 mg Oral Daily  . olopatadine  1 drop Both Eyes BID  . pantoprazole  40 mg Oral Daily  . potassium chloride SA  20 mEq Oral BID  . sodium chloride  3 mL Intravenous Q12H  . spironolactone  12.5 mg Oral Daily  . Warfarin - Pharmacist Dosing Inpatient   Does not apply q1800    Assessment: 57 yo female with 1 week history of chest pain and shortness of breath with activity. MD was dosing warfarin for atrial fibrillation, but has now asked pharmacy to dose. INR has been > 3 since admission so warfarin has been held (INR on admission was 3.78). INR today 2.81. Last dose according to PTA med  list was on 10/29. Patient reported being compliant with home schedule. Since patient came in with elevated INR on home regimen, warfarin will be started at smaller dose. Patient should be seen in outpatient clinic upon discharge to evaluate warfarin regimen. No S/S bleeding.  Warfarin home schedule: 2mg  all days except none on Sunday.  Goal of Therapy:  INR 2-3 Monitor platelets by anticoagulation protocol: Yes   Plan:  - Warfarin 1mg  x 1 today - Follow-up daily CBC and INR - Monitor for S/S of bleeding  Chosen Garron A. Pincus Badder, PharmD Clinical Pharmacist - Resident Pager: [phone] Pharmacy: [phone] 06/22/2013 2:11 PM

## 2013-06-23 LAB — GLUCOSE, CAPILLARY
Glucose-Capillary: 98 mg/dL (ref 70–99)
Glucose-Capillary: 79 mg/dL (ref 70–99)
Glucose-Capillary: 14 mg/dL — CL (ref 70–99)
Glucose-Capillary: 95 mg/dL (ref 70–99)
Glucose-Capillary: 112 mg/dL — ABNORMAL HIGH (ref 70–99)
Glucose-Capillary: 106 mg/dL — ABNORMAL HIGH (ref 70–99)
Glucose-Capillary: 96 mg/dL (ref 70–99)

## 2013-06-23 LAB — PROTIME-INR
INR: 2.42 — ABNORMAL HIGH (ref 0.00–1.49)
Prothrombin Time: 25.5 seconds — ABNORMAL HIGH (ref 11.6–15.2)

## 2013-06-23 MED ORDER — WARFARIN SODIUM 2 MG PO TABS
2.0000 mg | ORAL_TABLET | Freq: Once | ORAL | Status: AC
  Administered 2013-06-23: 2 mg via ORAL
  Filled 2013-06-23: qty 1

## 2013-06-23 NOTE — Progress Notes (Signed)
ANTICOAGULATION CONSULT NOTE - Follow Up Consult  Pharmacy Consult for Warfarin Indication: atrial fibrillation  Allergies  Allergen Reactions  . Sulfa Antibiotics Itching    Patient Measurements: Height: 5\' 4"  (162.6 cm) Weight: 391 lb 3.2 oz (177.447 kg) IBW/kg (Calculated) : 54.7   Vital Signs: Temp: 98.4 F (36.9 C) (11/03 0618) Temp src: Oral (11/03 0618) BP: 120/79 mmHg (11/03 0618) Pulse Rate: 87 (11/03 0618)  Labs:  Recent Labs  06/20/13 0900 06/21/13 0519 06/22/13 0425 06/23/13 0555  HGB  --   --  11.8*  --   HCT  --   --  37.2  --   PLT  --   --  222  --   LABPROT  --  31.7* 28.6* 25.5*  INR  --  3.21* 2.81* 2.42*  CREATININE  --   --  1.34*  --   TROPONINI <0.30  --   --   --     Estimated Creatinine Clearance: 76.8 ml/min (by C-G formula based on Cr of 1.34).   Assessment: 57 yo female with 1 week history of chest pain and shortness of breath with activity. MD was dosing warfarin for atrial fibrillation, but has now asked pharmacy to dose. INR has been > 3 since admission so warfarin has been held (INR on admission was 3.78). INR today 2.42.  No S/S bleeding.  Warfarin home schedule: 2mg  all days except none on Sunday.  Goal of Therapy:  INR 2-3 Monitor platelets by anticoagulation protocol: Yes   Plan:  - Warfarin 2 mg x 1 today - Follow-up daily CBC and INR - Monitor for S/S of bleeding  Thank you. Anette Guarneri, PharmD [phone]  06/23/2013 8:55 AM

## 2013-06-23 NOTE — Clinical Documentation Improvement (Signed)
THIS DOCUMENT IS NOT A PERMANENT PART OF THE MEDICAL RECORD  Please update your documentation with the medical record to reflect your response to this query. If you need help knowing how to do this please call [phone].           06/23/13  Dear Dr. Wardell Honour,   Per chart patient admitted with "chest pain" and MI ruled out. Primary diagnosis is still "chest pain". In the Coding world this term is nonspecific and low weighted. If possible, please clarify this term to illustrate this patient's severity of illness and risk of mortality. Thank you.  Possible Clinical Conditions?  - Acute CHF - unstable/stable angina - ACS - musculoskeletal chest pain - GERD - Costochondritis - other condition (please specify)  You may use possible, probable, or suspect with inpatient documentation. possible, probable, suspected diagnoses MUST be documented at the time of discharge  Reviewed: additional documentation in the medical record  Thank You,  Ezekiel Ina RN Clinical Documentation Specialist: [place]

## 2013-06-23 NOTE — Progress Notes (Signed)
RT placed patient on cpap auto via full face mask. Patient is tolerating cpap well at this time.

## 2013-06-23 NOTE — Progress Notes (Signed)
Dr Doylene Canard notified that CBG 96 and had 40 unit of Lantus and a sliding scale insulin was not given MD was in agreement will continue to monitor. [name]

## 2013-06-23 NOTE — Evaluation (Signed)
Occupational Therapy Evaluation Patient Details Name: [name] MRN: [id number] DOB: 1956/03/16 Today's Date: 06/23/2013 Time: [id number] OT Time Calculation (min): 49 min  OT Assessment / Plan / Recommendation History of present illness 57 y.o. female admitted to [facility] on 06/19/13 with 1 week history of chest pain, shortness of breath with activity.  MI ruled out.  Pt being diuresed for volume overload and CHF exacerbation.     Clinical Impression   Pt presents w/ dx as above. She currently demonstrates deficits in areas of ADL's w/ independence being impacted (see OT problem list below). She will benefit from acute OT followed by HHOT/HHPT at d/c. Pt will need wide 3:1 prior to d/c.    OT Assessment  Patient needs continued OT Services    Follow Up Recommendations  Home health OT    Barriers to Discharge      Equipment Recommendations  Other (comment) (Pt needs wide 3:1)    Recommendations for Other Services    Frequency  Min 2X/week    Precautions / Restrictions Precautions Precautions: Fall Restrictions Weight Bearing Restrictions: No Other Position/Activity Restrictions: Per chart: Bed rest w/ bathroom privileges   Pertinent Vitals/Pain No c/o, denies pain.    ADL  Eating/Feeding: Performed;Independent Where Assessed - Eating/Feeding: Bed level Grooming: Performed;Wash/dry hands;Wash/dry face;Teeth care;Brushing hair;Set up;Modified independent Where Assessed - Grooming: Unsupported sitting Upper Body Bathing: Performed;Chest;Right arm;Left arm;Abdomen;Set up Where Assessed - Upper Body Bathing: Unsupported sitting Lower Body Bathing: Performed;Moderate assistance (Back, lower LE's & peri areas) Where Assessed - Lower Body Bathing: Supported sit to stand;Supported standing Upper Body Dressing: Performed;Set up Where Assessed - Upper Body Dressing: Unsupported sitting Lower Body Dressing: Performed;Minimal assistance Where Assessed - Lower Body Dressing:  Supported sit to stand Toilet Transfer: Performed;Supervision/safety;Min guard Armed forces technical officer Method: Sit to stand (Amb in bathroom w/ SPC supervision) Science writer: Comfort height toilet;Grab bars Toileting - Clothing Manipulation and Hygiene: Performed;Supervision/safety;Min guard Where Assessed - Best boy and Hygiene: Sit to stand from 3-in-1 or toilet;Standing Tub/Shower Transfer Method: Not assessed Equipment Used: Cane Transfers/Ambulation Related to ADLs: Pt was overall supervision - min guard level assist for toilet transfer in bathroom & on/off commode. Pt tends to reach out and grab for objects in room, counter top, bed rail etc as walking. ADL Comments: Pt seen for full ADL related to bathing, dressing and trasnfers. She was educated in role of OT and verbally educated in energy conservation tech's, rest breaks etc. Pt reports that PTA, her husband assisted w/ bathing peri area & lower LE's (lower legs/feet).    OT Diagnosis: Generalized weakness  OT Problem List: Decreased activity tolerance;Cardiopulmonary status limiting activity;Decreased knowledge of use of DME or AE OT Treatment Interventions: Self-care/ADL training;Energy conservation;DME and/or AE instruction;Patient/family education;Therapeutic activities   OT Goals(Current goals can be found in the care plan section) Acute Rehab OT Goals Patient Stated Goal: To get better, go home OT Goal Formulation: With patient Time For Goal Achievement: 07/07/13 Potential to Achieve Goals: Good  Visit Information  Last OT Received On: 06/23/13 Assistance Needed: +1 History of Present Illness: 57 y.o. female admitted to [facility] on 06/19/13 with 1 week history of chest pain, shortness of breath with activity.  MI ruled out.  Pt being diuresed for volume overload and CHF exacerbation.         Prior [place] expects to be discharged to:: Private residence Living  Arrangements: Spouse/significant other;Children Available Help at Discharge: Family;Available 24 hours/day Type of  Home: House Home Access: Stairs to enter [employer] of Steps: 2 Entrance Stairs-Rails: None Home Layout: One level Home Equipment: Walker - 2 wheels;Cane - single point Additional Comments: per pt report she is moving to a smaller place soon.  "down sizing" since her children no longer live at home Prior Function Level of Independence: Needs assistance ADL's / Homemaking Assistance Needed: Pt reports that her husband assists w/ bathing lower legs/feet & peri area while she stands & bends over. Min assist Comments: uses cane to walk or walker if she is not feeling well.  Communication Communication: No difficulties Dominant Hand: Right    Vision/Perception Vision - History Patient Visual Report: No change from baseline   Cognition  Cognition Arousal/Alertness: Awake/alert Behavior During Therapy: WFL for tasks assessed/performed Overall Cognitive Status: Within Functional Limits for tasks assessed    Extremity/Trunk Assessment Upper Extremity Assessment Upper Extremity Assessment: Generalized weakness Lower Extremity Assessment Lower Extremity Assessment: Generalized weakness Cervical / Trunk Assessment Cervical / Trunk Assessment: Normal     Mobility Bed Mobility Bed Mobility: Supine to Sit;Sitting - Scoot to Edge of Bed Supine to Sit: 6: Modified independent (Device/Increase time);With rails;HOB elevated Sitting - Scoot to Edge of Bed: 6: Modified independent (Device/Increase time);With rail Details for Bed Mobility Assistance: used railing and HOB to get to EOB.  Extra time needed to manage legs- per pt report they are still more swollen than usual.   Transfers Transfers: Sit to Stand;Stand to Sit Sit to Stand: 5: Supervision;With upper extremity assist;From bed Stand to Sit: 5: Supervision;With upper extremity assist;With armrests;To  chair/3-in-1 Details for Transfer Assistance: supervision for safety due to extra time and momentum needed to get to standing.          Balance Balance Balance Assessed: Yes Static Sitting Balance Static Sitting - Balance Support: No upper extremity supported;Feet supported Static Standing Balance Static Standing - Balance Support: Right upper extremity supported;During functional activity   End of Session OT - End of Session Equipment Utilized During Treatment: Other (comment) (SPC, Comfort height toilet in bathroom) Activity Tolerance: Patient tolerated treatment well Patient left: in chair;with call bell/phone within reach;with nursing/sitter in room Nurse Communication: Other (comment) (ADL's completed)  GO     Josephine Igo Dixon 06/23/2013, 10:11 AM

## 2013-06-24 LAB — GLUCOSE, CAPILLARY
Glucose-Capillary: 84 mg/dL (ref 70–99)
Glucose-Capillary: 156 mg/dL — ABNORMAL HIGH (ref 70–99)

## 2013-06-24 LAB — PROTIME-INR
INR: 2.2 — ABNORMAL HIGH (ref 0.00–1.49)
Prothrombin Time: 23.7 seconds — ABNORMAL HIGH (ref 11.6–15.2)

## 2013-06-24 MED ORDER — WARFARIN SODIUM 2 MG PO TABS: 2.0000 mg | ORAL_TABLET | Freq: Every day | ORAL | Status: DC

## 2013-06-24 MED ORDER — WARFARIN SODIUM 4 MG PO TABS
4.0000 mg | ORAL_TABLET | Freq: Once | ORAL | Status: DC
  Filled 2013-06-24: qty 1

## 2013-06-24 MED ORDER — CARVEDILOL 6.25 MG PO TABS: 6.2500 mg | ORAL_TABLET | Freq: Two times a day (BID) | ORAL | Status: DC

## 2013-06-24 MED ORDER — INSULIN GLARGINE 100 UNIT/ML ~~LOC~~ SOLN: 15.0000 [IU] | Freq: Two times a day (BID) | SUBCUTANEOUS | Status: DC

## 2013-06-24 NOTE — Progress Notes (Signed)
ANTICOAGULATION CONSULT NOTE - Follow Up Consult  Pharmacy Consult for Warfarin Indication: atrial fibrillation  Allergies  Allergen Reactions  . Sulfa Antibiotics Itching    Patient Measurements: Height: 5\' 4"  (162.6 cm) Weight: 392 lb 6.7 oz (178 kg) IBW/kg (Calculated) : 54.7   Vital Signs: Temp: 97.9 F (36.6 C) (11/04 0552) Temp src: Oral (11/04 0552) BP: 141/89 mmHg (11/04 0552) Pulse Rate: 66 (11/04 0552)  Labs:  Recent Labs  06/22/13 0425 06/23/13 0555 06/24/13 0550  HGB 11.8*  --   --   HCT 37.2  --   --   PLT 222  --   --   LABPROT 28.6* 25.5* 23.7*  INR 2.81* 2.42* 2.20*  CREATININE 1.34*  --   --     Estimated Creatinine Clearance: 77 ml/min (by C-G formula based on Cr of 1.34).   Assessment: 57 yo female with 1 week history of chest pain and shortness of breath with activity. MD was dosing warfarin for atrial fibrillation, but has now asked pharmacy to dose. INR has been > 3 since admission so warfarin has been held (INR on admission was 3.78). INR today 2.2.  No S/S bleeding.  Warfarin home schedule: 2mg  all days except none on Sunday.  Goal of Therapy:  INR 2-3 Monitor platelets by anticoagulation protocol: Yes   Plan:  - Warfarin 4 mg x 1 today - Follow-up daily CBC and INR - Monitor for S/S of bleeding  Thank you. Anette Guarneri, PharmD [phone]  06/24/2013 8:47 AM

## 2013-06-24 NOTE — Discharge Summary (Signed)
Physician Discharge Summary  Patient ID: [name] MRN: [id number] DOB/AGE: 57/17/1957 57 y.o.  Admit date: 06/19/2013 Discharge date: 06/24/2013  Admission Diagnoses: Chest pain  MI ruled out  A. fibrillation  Diabetes mellitus, II  Hypertension  COPD  Obstructive sleep apnea/obesity hypoventilation syndrome  Morbid obesity  Chronic kidney disease  History of DVT  Depression  Degenerative joint disease  Discharge Diagnoses:  Principle Problem: * Chest pain * Severe tricuspid regurgitation Moderate pulmonary hypertension A. fibrillation  Diabetes mellitus, II  Hypertension  COPD  Obstructive sleep apnea/obesity hypoventilation syndrome  Morbid obesity  Chronic kidney disease  History of DVT  Depression  Degenerative joint disease  Discharged Condition: fair  Hospital Course: 57 years old female has 1 week history of chest pain, shortness of breath with activity and increasing leg edema. She has history of CHF and atrial fibrillation and was evaluated by Dr. Rayann Heman 2 months ago and by Dr. Terrence Dupont 4 months ago. She responded well o diuretic. Her echocardiogram showed Moderate LV hypertrophy and Severe TR. Patient understood need to decrease food, fluid intake and salt intake and increase activity.  Consults: None  Significant Diagnostic Studies: labs: Normal CBC, elevated BNP of 2177, Dig level of 1.5 and INR-3.78 to 2.2  CXR- Moderate cardiomegaly and pulmonary venous hypertension.  EKG- Atrial fibrillation with low voltage and possible anterior wall MI.  Echocardiogram: - Left ventricle: The cavity size was normal. There was mild concentric hypertrophy. Systolic function was normal. The estimated ejection fraction was in the range of 60% to 65%. Wall motion was normal; there were no regional wall motion abnormalities. - Mitral valve: Calcified annulus. Mild regurgitation. - Left atrium: The atrium was mildly dilated. - Right ventricle: The cavity size was  moderately dilated. Wall thickness was normal. - Right atrium: The atrium was severely dilated. - Tricuspid valve: Severe regurgitation. - Pulmonary arteries: Systolic pressure was moderately increased. PA peak pressure: 35mm Hg (S).  Treatments: cardiac meds: carvedilol, digoxin and furosemide.  Discharge Exam: Blood pressure 141/89, pulse 66, temperature 97.9 F (36.6 C), temperature source Oral, resp. rate 18, height 5\' 4"  (1.626 m), weight 178 kg (392 lb 6.7 oz), SpO2 97.00%. Physical exam: HEENT: [place]/AT, Eyes-Brown, PERL, EOMI, Conjunctiva-Pink, Sclera-Non-icteric  Neck: No JVD, No bruit, Trachea midline.  Lungs: Clear, Bilateral.  Cardiac: Regular rhythm, normal S1 and S2, no S3. II/VI systolic murmur. Abdomen: Soft, non-tender.  Extremities: Trace edema present. No cyanosis. No clubbing.  CNS: AxOx3, Cranial nerves grossly intact, moves all 4 extremities. Right handed.  Skin: Warm and dry.   Disposition: 01-Home or Self Care  Discharge Orders   Future Appointments Provider Department Dept Phone   09/09/2013 1:45 PM Harriet Masson, [facility]   Future Orders Complete By Expires   Ambulatory referral to Nutrition and Diabetic Education  As directed    Comments:     A1c back in June was 11.4%.  Has history of DM and morbid obesity.       Medication List    STOP taking these medications       meloxicam 7.5 MG tablet  Commonly known as:  MOBIC     oxyCODONE 5 MG immediate release tablet  Commonly known as:  Oxy IR/ROXICODONE     potassium chloride 10 MEQ tablet  Commonly known as:  K-DUR      TAKE these medications       carvedilol 6.25 MG tablet  Commonly known as:  COREG  Take 1  tablet (6.25 mg total) by mouth 2 (two) times daily with a meal.     clorazepate 15 MG tablet  Commonly known as:  TRANXENE  Take 15 mg by mouth Twice daily.     CRESTOR 10 MG tablet  Generic drug:  rosuvastatin  Take 10 mg by mouth daily.      digoxin 0.25 MG tablet  Commonly known as:  LANOXIN  Take 0.25 mg by mouth daily.     furosemide 40 MG tablet  Commonly known as:  LASIX  Take 20 mg by mouth daily.     gabapentin 300 MG capsule  Commonly known as:  NEURONTIN  Take 300 mg by mouth at bedtime.     insulin aspart 100 UNIT/ML injection  Commonly known as:  novoLOG  Inject 5-12 Units into the skin every 6 (six) hours. 200-300 takes 5 units, 301-400 takes 7 units, 401-500 takes 12 units.     insulin glargine 100 UNIT/ML injection  Commonly known as:  LANTUS  Inject 0.15 mLs (15 Units total) into the skin 2 (two) times daily.     JANUVIA 50 MG tablet  Generic drug:  sitaGLIPtin  Take 50 mg by mouth daily.     losartan 50 MG tablet  Commonly known as:  COZAAR  Take 1 tablet by mouth daily.     neomycin-bacitracin-polymyxin [phone] ointment  Apply 1 application topically 2 (two) times daily. Apply to big toe     omeprazole 40 MG capsule  Commonly known as:  PRILOSEC  Take 40 mg by mouth daily.     oxyCODONE-acetaminophen 5-325 MG per tablet  Commonly known as:  PERCOCET/ROXICET  Take 1 tablet by mouth every 4 (four) hours as needed for pain.     PATANOL 0.1 % ophthalmic solution  Generic drug:  olopatadine  Place 1 drop into both eyes 2 (two) times daily.     potassium chloride SA 20 MEQ tablet  Commonly known as:  K-DUR,KLOR-CON  Take 20 mEq by mouth Twice daily.     PROAIR HFA 108 (90 BASE) MCG/ACT inhaler  Generic drug:  albuterol  Inhale 2 puffs into the lungs every 4 (four) hours as needed for wheezing.     spironolactone 25 MG tablet  Commonly known as:  ALDACTONE  Take 12.5 mg by mouth daily.     warfarin 2 MG tablet  Commonly known as:  COUMADIN  Take 1 tablet (2 mg total) by mouth daily.           Follow-up Information   Follow up with Patricia Nettle, MD. Schedule an appointment as soon as possible for a visit in 1 week.   Specialty:  Cardiology   Contact information:   [address] [place] [address] [phone]       Signed: Birdie Riddle 06/24/2013, 9:23 AM

## 2013-06-24 NOTE — Progress Notes (Signed)
Physical Therapy Treatment Patient Details Name: Mawada Dewaard MRN: [id number] DOB: Nov 24, 1955 Today's Date: 06/24/2013 Time: [id number] PT Time Calculation (min): 21 min  PT Assessment / Plan / Recommendation  History of Present Illness 57 y.o. female admitted to [facility] on 06/19/13 with 1 week history of chest pain, shortness of breath with activity.  MI ruled out.  Pt being diuresed for volume overload and CHF exacerbation.     PT Comments   Pt did well and is scheduled to go home today.  Follow Up Recommendations  Home health PT;Supervision for mobility/OOB     Does the patient have the potential to tolerate intense rehabilitation     Barriers to Discharge        Equipment Recommendations  None recommended by PT    Recommendations for Other Services    Frequency Min 3X/week   Progress towards PT Goals Progress towards PT goals: Progressing toward goals  Plan Current plan remains appropriate    Precautions / Restrictions     Pertinent Vitals/Pain No c/o pain    Mobility  Bed Mobility Bed Mobility: Supine to Sit;Sitting - Scoot to Edge of Bed Supine to Sit: 6: Modified independent (Device/Increase time);With rails;HOB elevated Sitting - Scoot to Edge of Bed: 6: Modified independent (Device/Increase time);With rail Transfers Transfers: Sit to Stand;Stand to Sit Sit to Stand: 6: Modified independent (Device/Increase time) Stand to Sit: 6: Modified independent (Device/Increase time) Ambulation/Gait Ambulation/Gait Assistance: 5: Supervision;4: Min guard Ambulation Distance (Feet): 150 Feet Assistive device: Straight cane Ambulation/Gait Assistance Details: MIn/guard initially as pt kept reaching out with other hand for rail, etc, but then pt became more steady and ambulated with S. Gait Pattern: Wide base of support Gait velocity: decreased    Exercises General Exercises - Lower Extremity Hip Flexion/Marching: Standing;10 reps;Strengthening   PT Diagnosis:    PT  Problem List:   PT Treatment Interventions:     PT Goals (current goals can now be found in the care plan section)    Visit Information  Last PT Received On: 06/24/13 Assistance Needed: +1 History of Present Illness: 57 y.o. female admitted to [facility] on 06/19/13 with 1 week history of chest pain, shortness of breath with activity.  MI ruled out.  Pt being diuresed for volume overload and CHF exacerbation.      Subjective Data  Subjective: "I am ready to go home."   Cognition  Cognition Arousal/Alertness: Awake/alert Behavior During Therapy: WFL for tasks assessed/performed Overall Cognitive Status: Within Functional Limits for tasks assessed    Balance  Balance Balance Assessed: Yes Static Standing Balance Static Standing - Balance Support: Right upper extremity supported Static Standing - Level of Assistance: 6: Modified independent (Device/Increase time) Static Standing - Comment/# of Minutes: 5 (while speaking with friend in hallway)  End of Session PT - End of Session Equipment Utilized During Treatment: Gait belt Activity Tolerance: Patient tolerated treatment well Patient left: in chair;with call bell/phone within reach;with nursing/sitter in room Nurse Communication: Mobility status   GP     Neena Beecham LUBECK 06/24/2013, 9:31 AM

## 2013-06-24 NOTE — Progress Notes (Signed)
Late entry Subjective:  Feeling better. Not aware of severe tricuspid regurgitation.  Objective:  Vital Signs in the last 24 hours: Temp:  [97.5 F (36.4 C)-98.3 F (36.8 C)] 97.9 F (36.6 C) (11/04 0552) Pulse Rate:  [66-79] 66 (11/04 0552) Cardiac Rhythm:  [-] Atrial fibrillation (11/04 0730) Resp:  [17-18] 18 (11/04 0552) BP: (101-141)/(59-89) 141/89 mmHg (11/04 0552) SpO2:  [97 %-100 %] 97 % (11/04 0552) Weight:  [178 kg (392 lb 6.7 oz)] 178 kg (392 lb 6.7 oz) (11/04 0552)  Physical Exam: BP Readings from Last 1 Encounters:  06/24/13 141/89     Wt Readings from Last 1 Encounters:  06/24/13 178 kg (392 lb 6.7 oz)    Weight change:   HEENT: [place]/AT, Eyes-Brown, PERL, EOMI, Conjunctiva-Pink, Sclera-Non-icteric Neck: No JVD, No bruit, Trachea midline. Lungs:  Clear, Bilateral. Cardiac:  Regular rhythm, normal S1 and S2, no S3.  Abdomen:  Soft, non-tender. Extremities:  Trace edema present. No cyanosis. No clubbing. CNS: AxOx3, Cranial nerves grossly intact, moves all 4 extremities. Right handed. Skin: Warm and dry.   Intake/Output from previous day: 11/03 0701 - 11/04 0700 In: 360 [P.O.:360] Out: 700 [Urine:700]    Lab Results: BMET    Component Value Date/Time   NA 138 06/22/2013 0425   K 4.2 06/22/2013 0425   CL 104 06/22/2013 0425   CO2 25 06/22/2013 0425   GLUCOSE 91 06/22/2013 0425   BUN 12 06/22/2013 0425   CREATININE 1.34* 06/22/2013 0425   CALCIUM 8.8 06/22/2013 0425   GFRNONAA 43* 06/22/2013 0425   GFRAA 50* 06/22/2013 0425   CBC    Component Value Date/Time   WBC 6.5 06/22/2013 0425   RBC 4.32 06/22/2013 0425   HGB 11.8* 06/22/2013 0425   HCT 37.2 06/22/2013 0425   PLT 222 06/22/2013 0425   MCV 86.1 06/22/2013 0425   MCH 27.3 06/22/2013 0425   MCHC 31.7 06/22/2013 0425   RDW 15.4 06/22/2013 0425   LYMPHSABS 3.3 01/20/2013 1628   MONOABS 0.8 01/20/2013 1628   EOSABS 0.2 01/20/2013 1628   BASOSABS 0.0 01/20/2013 1628   CARDIAC ENZYMES Lab Results  Component Value  Date   CKTOTAL 118 02/27/2012   CKMB 1.1 02/27/2012   TROPONINI <0.30 06/20/2013    Scheduled Meds: . atorvastatin  40 mg Oral q1800  . carvedilol  18.75 mg Oral Daily  . clorazepate  15 mg Oral BID  . digoxin  0.25 mg Oral Daily  . furosemide  20 mg Oral Daily  . gabapentin  300 mg Oral QHS  . insulin aspart  5-12 Units Subcutaneous Q6H  . insulin glargine  40 Units Subcutaneous BID  . losartan  50 mg Oral Daily  . olopatadine  1 drop Both Eyes BID  . pantoprazole  40 mg Oral Daily  . potassium chloride SA  20 mEq Oral BID  . sodium chloride  3 mL Intravenous Q12H  . spironolactone  12.5 mg Oral Daily  . warfarin  4 mg Oral ONCE-1800  . Warfarin - Pharmacist Dosing Inpatient   Does not apply q1800   Continuous Infusions:  PRN Meds:.sodium chloride, acetaminophen, albuterol, nitroGLYCERIN, ondansetron (ZOFRAN) IV, oxyCODONE-acetaminophen, sodium chloride  Assessment/Plan: Chest pain  MI ruled out  A. fibrillation  Diabetes mellitus, II  Hypertension  COPD  Obstructive sleep apnea/obesity hypoventilation syndrome  Morbid obesity  Chronic kidney disease  History of DVT  Depression  Degenerative joint disease  Discussed diet and fluid restriction. Increase activity. Home in AM.  LOS: 5 days    Dixie Dials  MD  06/24/2013, 9:20 AM

## 2013-06-30 ENCOUNTER — Telehealth: Payer: Self-pay | Admitting: *Deleted

## 2013-06-30 NOTE — Telephone Encounter (Signed)
Pt asked if diabetic shoes were in.  Informed pt her shoes were in and that she would need and appt.  Transferred to scheduler.

## 2013-08-19 ENCOUNTER — Ambulatory Visit: Payer: Medicare Other

## 2013-09-02 ENCOUNTER — Other Ambulatory Visit: Payer: Self-pay | Admitting: Cardiology

## 2013-09-02 DIAGNOSIS — Z1231 Encounter for screening mammogram for malignant neoplasm of breast: Secondary | ICD-10-CM

## 2013-09-04 ENCOUNTER — Ambulatory Visit: Payer: Medicare Other | Admitting: Dietician

## 2013-09-04 ENCOUNTER — Ambulatory Visit: Payer: Medicare Other | Admitting: *Deleted

## 2013-09-09 ENCOUNTER — Ambulatory Visit: Payer: Medicare Other

## 2013-09-24 ENCOUNTER — Ambulatory Visit: Payer: Medicare Other

## 2013-10-14 ENCOUNTER — Ambulatory Visit: Payer: Medicare Other

## 2013-10-21 ENCOUNTER — Ambulatory Visit: Payer: Medicare Other

## 2013-10-28 ENCOUNTER — Ambulatory Visit (INDEPENDENT_AMBULATORY_CARE_PROVIDER_SITE_OTHER): Payer: Medicare Other

## 2013-10-28 VITALS — BP 145/67 | HR 85 | Resp 18

## 2013-10-28 DIAGNOSIS — E1140 Type 2 diabetes mellitus with diabetic neuropathy, unspecified: Secondary | ICD-10-CM

## 2013-10-28 DIAGNOSIS — E1149 Type 2 diabetes mellitus with other diabetic neurological complication: Secondary | ICD-10-CM

## 2013-10-28 DIAGNOSIS — M722 Plantar fascial fibromatosis: Secondary | ICD-10-CM

## 2013-10-28 DIAGNOSIS — L608 Other nail disorders: Secondary | ICD-10-CM

## 2013-10-28 DIAGNOSIS — M2040 Other hammer toe(s) (acquired), unspecified foot: Secondary | ICD-10-CM

## 2013-10-28 NOTE — Progress Notes (Signed)
   Subjective:    Patient ID: [name], female    DOB: 09/10/1955, 58 y.o.   MRN: [id number]  HPI Comments: "I need my toenails cut"   patient had partial nail excision left hallux nail medial border in the past the nails growing out little small spicules debrided away if this time. Patient also had diabetic shoes ordered had to be reordered is mostly when fit her patient apparently had some issues had reschedule and was unable to pick up her shoes prior to the end of the year she presents this time for diabetic shoe pick up and fitting.    Review of Systems no new changes or findings     Objective:   Physical Exam Worsening objective vascular status appears to be intact with pedal pulses DP plus one over 4 bilateral PT also thready at one over 4 bilateral +1 to +2 edema both lower extremities patient does have decreased epicritic sensation are present of sensation is intact there is normal plantar response DTRs not elicited Dr. refill time 3 seconds all digits again no edema foot and ankles. No open wounds ulcerations no secondary infections at this time.       Assessment & Plan:  Assessment this time his diabetes with peripheral neuropathy dystrophic friable mycotic nails 1 through 5 bilateral debridement at this time with attention to the medial border of the left hallux which is smooth thoroughly patient also dispensed 1 pair of extra-depth shoes a size 1040 and athletic style with custom molded dual density Plastizote inlays in lace fit and contour well to fold with full contact the patient's foot and arch. Patient given or written instructions for shoewear break in and adjustments recheck in 3 months for continued palliative care as well she check at that time. Nails debrided x10 sheet is dispensed at this time and diabetic insoles dispensed at this time x3  Harriet Masson DPM

## 2013-10-28 NOTE — Patient Instructions (Signed)
Diabetes and Foot Care Diabetes may cause you to have problems because of poor blood supply (circulation) to your feet and legs. This may cause the skin on your feet to become thinner, break easier, and heal more slowly. Your skin may become dry, and the skin may peel and crack. You may also have nerve damage in your legs and feet causing decreased feeling in them. You may not notice minor injuries to your feet that could lead to infections or more serious problems. Taking care of your feet is one of the most important things you can do for yourself.  HOME CARE INSTRUCTIONS  Wear shoes at all times, even in the house. Do not go barefoot. Bare feet are easily injured.  Check your feet daily for blisters, cuts, and redness. If you cannot see the bottom of your feet, use a mirror or ask someone for help.  Wash your feet with warm water (do not use hot water) and mild soap. Then pat your feet and the areas between your toes until they are completely dry. Do not soak your feet as this can dry your skin.  Apply a moisturizing lotion or petroleum jelly (that does not contain alcohol and is unscented) to the skin on your feet and to dry, brittle toenails. Do not apply lotion between your toes.  Trim your toenails straight across. Do not dig under them or around the cuticle. File the edges of your nails with an emery board or nail file.  Do not cut corns or calluses or try to remove them with medicine.  Wear clean socks or stockings every day. Make sure they are not too tight. Do not wear knee-high stockings since they may decrease blood flow to your legs.  Wear shoes that fit properly and have enough cushioning. To break in new shoes, wear them for just a few hours a day. This prevents you from injuring your feet. Always look in your shoes before you put them on to be sure there are no objects inside.  Do not cross your legs. This may decrease the blood flow to your feet.  If you find a minor scrape,  cut, or break in the skin on your feet, keep it and the skin around it clean and dry. These areas may be cleansed with mild soap and water. Do not cleanse the area with peroxide, alcohol, or iodine.  When you remove an adhesive bandage, be sure not to damage the skin around it.  If you have a wound, look at it several times a day to make sure it is healing.  Do not use heating pads or hot water bottles. They may burn your skin. If you have lost feeling in your feet or legs, you may not know it is happening until it is too late.  Make sure your health care provider performs a complete foot exam at least annually or more often if you have foot problems. Report any cuts, sores, or bruises to your health care provider immediately. SEEK MEDICAL CARE IF:   You have an injury that is not healing.  You have cuts or breaks in the skin.  You have an ingrown nail.  You notice redness on your legs or feet.  You feel burning or tingling in your legs or feet.  You have pain or cramps in your legs and feet.  Your legs or feet are numb.  Your feet always feel cold. SEEK IMMEDIATE MEDICAL CARE IF:   There is increasing redness,   swelling, or pain in or around a wound.  There is a red line that goes up your leg.  Pus is coming from a wound.  You develop a fever or as directed by your health care provider.  You notice a bad smell coming from an ulcer or wound. Document Released: 08/04/2000 Document Revised: 04/09/2013 Document Reviewed: 01/14/2013 [employer] Patient Information 2014 [employer].  

## 2013-11-05 ENCOUNTER — Other Ambulatory Visit (HOSPITAL_COMMUNITY): Payer: Self-pay | Admitting: Cardiology

## 2013-11-05 DIAGNOSIS — R51 Headache: Principal | ICD-10-CM

## 2013-11-05 DIAGNOSIS — R519 Headache, unspecified: Secondary | ICD-10-CM

## 2013-11-05 DIAGNOSIS — H53139 Sudden visual loss, unspecified eye: Secondary | ICD-10-CM

## 2013-11-11 ENCOUNTER — Ambulatory Visit (HOSPITAL_COMMUNITY): Payer: Medicare Other

## 2013-11-14 ENCOUNTER — Ambulatory Visit (HOSPITAL_COMMUNITY)
Admission: RE | Admit: 2013-11-14 | Discharge: 2013-11-14 | Disposition: A | Discharge status: Home / Self Care | Payer: Medicare Other | Source: Ambulatory Visit | Attending: Cardiology | Admitting: Cardiology

## 2013-11-14 DIAGNOSIS — R519 Headache, unspecified: Secondary | ICD-10-CM

## 2013-11-14 DIAGNOSIS — H53139 Sudden visual loss, unspecified eye: Secondary | ICD-10-CM

## 2013-11-14 DIAGNOSIS — R51 Headache: Principal | ICD-10-CM

## 2013-11-14 NOTE — Progress Notes (Signed)
Bilateral carotid artery duplex completed.  Right:  1-39% ICA stenosis.  Left:  40-59% internal carotid artery stenosis.  Bilateral:  Vertebral artery flow is antegrade.

## 2014-01-05 ENCOUNTER — Telehealth: Payer: Self-pay | Admitting: *Deleted

## 2014-01-05 NOTE — Telephone Encounter (Signed)
Pt states her toenail is lifting an the area around the nail is puffy.  I referred to scheduler to set up an appt.

## 2014-01-07 ENCOUNTER — Other Ambulatory Visit: Payer: Self-pay

## 2014-01-07 ENCOUNTER — Encounter (HOSPITAL_COMMUNITY): Payer: Self-pay | Admitting: Emergency Medicine

## 2014-01-07 ENCOUNTER — Emergency Department (HOSPITAL_COMMUNITY): Payer: Medicare Other

## 2014-01-07 ENCOUNTER — Inpatient Hospital Stay (HOSPITAL_COMMUNITY)
Admission: EM | Admit: 2014-01-07 | Discharge: 2014-01-08 | DRG: 313 | Disposition: A | Discharge status: Home / Self Care | Payer: Medicare Other | Attending: Cardiology | Admitting: Cardiology

## 2014-01-07 DIAGNOSIS — G4733 Obstructive sleep apnea (adult) (pediatric): Secondary | ICD-10-CM | POA: Diagnosis present

## 2014-01-07 DIAGNOSIS — N189 Chronic kidney disease, unspecified: Secondary | ICD-10-CM | POA: Diagnosis not present

## 2014-01-07 DIAGNOSIS — R079 Chest pain, unspecified: Secondary | ICD-10-CM | POA: Diagnosis present

## 2014-01-07 DIAGNOSIS — F329 Major depressive disorder, single episode, unspecified: Secondary | ICD-10-CM | POA: Diagnosis not present

## 2014-01-07 DIAGNOSIS — M1990 Unspecified osteoarthritis, unspecified site: Secondary | ICD-10-CM | POA: Diagnosis present

## 2014-01-07 DIAGNOSIS — E662 Morbid (severe) obesity with alveolar hypoventilation: Secondary | ICD-10-CM | POA: Diagnosis not present

## 2014-01-07 DIAGNOSIS — R0789 Other chest pain: Principal | ICD-10-CM | POA: Diagnosis present

## 2014-01-07 DIAGNOSIS — E119 Type 2 diabetes mellitus without complications: Secondary | ICD-10-CM | POA: Diagnosis present

## 2014-01-07 DIAGNOSIS — Z86718 Personal history of other venous thrombosis and embolism: Secondary | ICD-10-CM | POA: Diagnosis not present

## 2014-01-07 DIAGNOSIS — Z8249 Family history of ischemic heart disease and other diseases of the circulatory system: Secondary | ICD-10-CM

## 2014-01-07 DIAGNOSIS — F3289 Other specified depressive episodes: Secondary | ICD-10-CM | POA: Diagnosis present

## 2014-01-07 DIAGNOSIS — I509 Heart failure, unspecified: Secondary | ICD-10-CM | POA: Diagnosis present

## 2014-01-07 DIAGNOSIS — I5032 Chronic diastolic (congestive) heart failure: Secondary | ICD-10-CM | POA: Diagnosis present

## 2014-01-07 DIAGNOSIS — I129 Hypertensive chronic kidney disease with stage 1 through stage 4 chronic kidney disease, or unspecified chronic kidney disease: Secondary | ICD-10-CM | POA: Diagnosis not present

## 2014-01-07 DIAGNOSIS — I4891 Unspecified atrial fibrillation: Secondary | ICD-10-CM | POA: Diagnosis not present

## 2014-01-07 DIAGNOSIS — M79609 Pain in unspecified limb: Secondary | ICD-10-CM

## 2014-01-07 DIAGNOSIS — E785 Hyperlipidemia, unspecified: Secondary | ICD-10-CM | POA: Diagnosis present

## 2014-01-07 DIAGNOSIS — J449 Chronic obstructive pulmonary disease, unspecified: Secondary | ICD-10-CM | POA: Diagnosis present

## 2014-01-07 DIAGNOSIS — J4489 Other specified chronic obstructive pulmonary disease: Secondary | ICD-10-CM | POA: Diagnosis present

## 2014-01-07 DIAGNOSIS — R072 Precordial pain: Secondary | ICD-10-CM | POA: Diagnosis not present

## 2014-01-07 LAB — GLUCOSE, CAPILLARY: Glucose-Capillary: 106 mg/dL — ABNORMAL HIGH (ref 70–99)

## 2014-01-07 LAB — CBC WITH DIFFERENTIAL/PLATELET
Basophils Absolute: 0 10*3/uL (ref 0.0–0.1)
Basophils Absolute: 0 10*3/uL (ref 0.0–0.1)
Basophils Relative: 0 % (ref 0–1)
Basophils Relative: 0 % (ref 0–1)
Eosinophils Absolute: 0.1 10*3/uL (ref 0.0–0.7)
Eosinophils Absolute: 0.1 10*3/uL (ref 0.0–0.7)
Eosinophils Relative: 2 % (ref 0–5)
Eosinophils Relative: 2 % (ref 0–5)
HCT: 39.9 % (ref 36.0–46.0)
HCT: 40.9 % (ref 36.0–46.0)
Hemoglobin: 13.1 g/dL (ref 12.0–15.0)
Hemoglobin: 13.3 g/dL (ref 12.0–15.0)
Lymphocytes Relative: 31 % (ref 12–46)
Lymphocytes Relative: 43 % (ref 12–46)
Lymphs Abs: 1.7 10*3/uL (ref 0.7–4.0)
Lymphs Abs: 3 10*3/uL (ref 0.7–4.0)
MCH: 28.9 pg (ref 26.0–34.0)
MCH: 28.9 pg (ref 26.0–34.0)
MCHC: 32.8 g/dL (ref 30.0–36.0)
MCHC: 32.5 g/dL (ref 30.0–36.0)
MCV: 88.1 fL (ref 78.0–100.0)
MCV: 88.9 fL (ref 78.0–100.0)
Monocytes Absolute: 0.6 10*3/uL (ref 0.1–1.0)
Monocytes Absolute: 0.5 10*3/uL (ref 0.1–1.0)
Monocytes Relative: 11 % (ref 3–12)
Monocytes Relative: 7 % (ref 3–12)
Neutro Abs: 3.2 10*3/uL (ref 1.7–7.7)
Neutro Abs: 3.3 10*3/uL (ref 1.7–7.7)
Neutrophils Relative %: 56 % (ref 43–77)
Neutrophils Relative %: 48 % (ref 43–77)
Platelets: 201 10*3/uL (ref 150–400)
Platelets: 211 10*3/uL (ref 150–400)
RBC: 4.53 MIL/uL (ref 3.87–5.11)
RBC: 4.6 MIL/uL (ref 3.87–5.11)
RDW: 14.5 % (ref 11.5–15.5)
RDW: 14.4 % (ref 11.5–15.5)
WBC: 5.6 10*3/uL (ref 4.0–10.5)
WBC: 6.9 10*3/uL (ref 4.0–10.5)

## 2014-01-07 LAB — BASIC METABOLIC PANEL
BUN: 11 mg/dL (ref 6–23)
CO2: 25 mEq/L (ref 19–32)
Calcium: 9.4 mg/dL (ref 8.4–10.5)
Chloride: 107 mEq/L (ref 96–112)
Creatinine, Ser: 1.38 mg/dL — ABNORMAL HIGH (ref 0.50–1.10)
GFR calc Af Amer: 48 mL/min — ABNORMAL LOW (ref >90)
GFR calc non Af Amer: 42 mL/min — ABNORMAL LOW (ref >90)
Glucose, Bld: 102 mg/dL — ABNORMAL HIGH (ref 70–99)
Potassium: 4.6 mEq/L (ref 3.7–5.3)
Sodium: 142 mEq/L (ref 137–147)

## 2014-01-07 LAB — URINALYSIS, ROUTINE W REFLEX MICROSCOPIC
Bilirubin Urine: NEGATIVE
Glucose, UA: NEGATIVE mg/dL
Hgb urine dipstick: NEGATIVE
Ketones, ur: NEGATIVE mg/dL
Leukocytes, UA: NEGATIVE
Nitrite: NEGATIVE
Protein, ur: NEGATIVE mg/dL
Specific Gravity, Urine: 1.016 (ref 1.005–1.030)
Urobilinogen, UA: 1 mg/dL (ref 0.0–1.0)
pH: 6 (ref 5.0–8.0)

## 2014-01-07 LAB — HEPATIC FUNCTION PANEL
ALT: 11 U/L (ref 0–35)
AST: 14 U/L (ref 0–37)
Albumin: 3.6 g/dL (ref 3.5–5.2)
Alkaline Phosphatase: 116 U/L (ref 39–117)
Bilirubin, Direct: <0.2 mg/dL (ref 0.0–0.3)
Total Bilirubin: 0.6 mg/dL (ref 0.3–1.2)
Total Protein: 7.8 g/dL (ref 6.0–8.3)

## 2014-01-07 LAB — TROPONIN I
Troponin I: <0.3 ng/mL (ref <0.30)
Troponin I: <0.3 ng/mL (ref <0.30)

## 2014-01-07 LAB — TSH: TSH: 2.89 u[IU]/mL (ref 0.350–4.500)

## 2014-01-07 LAB — I-STAT TROPONIN, ED
Troponin i, poc: 0.02 ng/mL (ref 0.00–0.08)
Troponin i, poc: 0 ng/mL (ref 0.00–0.08)

## 2014-01-07 LAB — CBG MONITORING, ED
Glucose-Capillary: 82 mg/dL (ref 70–99)
Glucose-Capillary: 81 mg/dL (ref 70–99)

## 2014-01-07 LAB — PROTIME-INR
INR: 1.83 — ABNORMAL HIGH (ref 0.00–1.49)
Prothrombin Time: 20.6 seconds — ABNORMAL HIGH (ref 11.6–15.2)

## 2014-01-07 LAB — HEPARIN LEVEL (UNFRACTIONATED): Heparin Unfractionated: 0.44 IU/mL (ref 0.30–0.70)

## 2014-01-07 LAB — DIGOXIN LEVEL
Digoxin Level: 1.1 ng/mL (ref 0.8–2.0)
Digoxin Level: 0.9 ng/mL (ref 0.8–2.0)

## 2014-01-07 LAB — MAGNESIUM: Magnesium: 1.8 mg/dL (ref 1.5–2.5)

## 2014-01-07 MED ORDER — INSULIN ASPART 100 UNIT/ML ~~LOC~~ SOLN: 0.0000 [IU] | Freq: Three times a day (TID) | SUBCUTANEOUS | Status: DC

## 2014-01-07 MED ORDER — HEPARIN (PORCINE) IN NACL 100-0.45 UNIT/ML-% IJ SOLN
1300.0000 [IU]/h | INTRAMUSCULAR | Status: AC
  Administered 2014-01-07 – 2014-01-08 (×2): 1300 [IU]/h via INTRAVENOUS
  Filled 2014-01-07 (×3): qty 250

## 2014-01-07 MED ORDER — CEPHALEXIN 250 MG PO CAPS: 500.0000 mg | ORAL_CAPSULE | Freq: Once | ORAL | Status: DC

## 2014-01-07 MED ORDER — ALBUTEROL SULFATE (2.5 MG/3ML) 0.083% IN NEBU: 2.5000 mg | INHALATION_SOLUTION | RESPIRATORY_TRACT | Status: DC | PRN

## 2014-01-07 MED ORDER — POTASSIUM CHLORIDE CRYS ER 20 MEQ PO TBCR
20.0000 meq | EXTENDED_RELEASE_TABLET | Freq: Two times a day (BID) | ORAL | Status: DC
  Administered 2014-01-07 – 2014-01-08 (×2): 20 meq via ORAL
  Filled 2014-01-07 (×3): qty 1

## 2014-01-07 MED ORDER — WARFARIN SODIUM 2 MG PO TABS
2.0000 mg | ORAL_TABLET | Freq: Once | ORAL | Status: AC
  Administered 2014-01-07: 2 mg via ORAL
  Filled 2014-01-07: qty 1

## 2014-01-07 MED ORDER — ATORVASTATIN CALCIUM 40 MG PO TABS
40.0000 mg | ORAL_TABLET | Freq: Every day | ORAL | Status: DC
  Administered 2014-01-08: 40 mg via ORAL
  Filled 2014-01-07: qty 1

## 2014-01-07 MED ORDER — ASPIRIN EC 81 MG PO TBEC
81.0000 mg | DELAYED_RELEASE_TABLET | Freq: Every day | ORAL | Status: DC
  Administered 2014-01-08: 81 mg via ORAL
  Filled 2014-01-07: qty 1

## 2014-01-07 MED ORDER — OLOPATADINE HCL 0.1 % OP SOLN
1.0000 [drp] | Freq: Two times a day (BID) | OPHTHALMIC | Status: DC
  Administered 2014-01-07 – 2014-01-08 (×2): 1 [drp] via OPHTHALMIC
  Filled 2014-01-07: qty 5

## 2014-01-07 MED ORDER — HEPARIN BOLUS VIA INFUSION
5000.0000 [IU] | Freq: Once | INTRAVENOUS | Status: AC
  Administered 2014-01-07: 5000 [IU] via INTRAVENOUS
  Filled 2014-01-07: qty 5000

## 2014-01-07 MED ORDER — FUROSEMIDE 20 MG PO TABS
20.0000 mg | ORAL_TABLET | Freq: Every day | ORAL | Status: DC
  Administered 2014-01-07 – 2014-01-08 (×2): 20 mg via ORAL
  Filled 2014-01-07 (×2): qty 1

## 2014-01-07 MED ORDER — CLORAZEPATE DIPOTASSIUM 3.75 MG PO TABS
7.5000 mg | ORAL_TABLET | Freq: Two times a day (BID) | ORAL | Status: DC
  Administered 2014-01-07 – 2014-01-08 (×2): 7.5 mg via ORAL
  Filled 2014-01-07 (×2): qty 2

## 2014-01-07 MED ORDER — INSULIN GLARGINE 100 UNIT/ML ~~LOC~~ SOLN
5.0000 [IU] | Freq: Two times a day (BID) | SUBCUTANEOUS | Status: DC
  Administered 2014-01-07 – 2014-01-08 (×2): 5 [IU] via SUBCUTANEOUS
  Filled 2014-01-07 (×3): qty 0.05

## 2014-01-07 MED ORDER — WARFARIN - PHARMACIST DOSING INPATIENT: Freq: Every day | Status: DC

## 2014-01-07 MED ORDER — MORPHINE SULFATE 4 MG/ML IJ SOLN
4.0000 mg | Freq: Once | INTRAMUSCULAR | Status: AC | PRN
  Administered 2014-01-07: 4 mg via INTRAVENOUS
  Filled 2014-01-07: qty 1

## 2014-01-07 MED ORDER — ASPIRIN 81 MG PO CHEW
324.0000 mg | CHEWABLE_TABLET | ORAL | Status: AC
  Administered 2014-01-07: 324 mg via ORAL
  Filled 2014-01-07: qty 4

## 2014-01-07 MED ORDER — MORPHINE SULFATE 4 MG/ML IJ SOLN
4.0000 mg | Freq: Once | INTRAMUSCULAR | Status: AC
  Administered 2014-01-07: 4 mg via INTRAVENOUS
  Filled 2014-01-07: qty 1

## 2014-01-07 MED ORDER — ALBUTEROL SULFATE HFA 108 (90 BASE) MCG/ACT IN AERS: 2.0000 | INHALATION_SPRAY | RESPIRATORY_TRACT | Status: DC | PRN

## 2014-01-07 MED ORDER — LOSARTAN POTASSIUM 50 MG PO TABS
50.0000 mg | ORAL_TABLET | Freq: Every day | ORAL | Status: DC
  Administered 2014-01-07 – 2014-01-08 (×2): 50 mg via ORAL
  Filled 2014-01-07 (×2): qty 1

## 2014-01-07 MED ORDER — ASPIRIN 300 MG RE SUPP: 300.0000 mg | RECTAL | Status: AC

## 2014-01-07 MED ORDER — NITROGLYCERIN 0.4 MG SL SUBL
0.4000 mg | SUBLINGUAL_TABLET | SUBLINGUAL | Status: DC | PRN
  Filled 2014-01-07: qty 1

## 2014-01-07 MED ORDER — ONDANSETRON HCL 4 MG/2ML IJ SOLN
4.0000 mg | Freq: Once | INTRAMUSCULAR | Status: AC
  Administered 2014-01-07: 4 mg via INTRAVENOUS
  Filled 2014-01-07: qty 2

## 2014-01-07 MED ORDER — CLORAZEPATE DIPOTASSIUM 3.75 MG PO TABS: 7.5000 mg | ORAL_TABLET | Freq: Two times a day (BID) | ORAL | Status: DC

## 2014-01-07 MED ORDER — GABAPENTIN 300 MG PO CAPS
300.0000 mg | ORAL_CAPSULE | Freq: Every day | ORAL | Status: DC
  Administered 2014-01-07: 300 mg via ORAL
  Filled 2014-01-07 (×2): qty 1

## 2014-01-07 MED ORDER — PANTOPRAZOLE SODIUM 40 MG PO TBEC
40.0000 mg | DELAYED_RELEASE_TABLET | Freq: Every day | ORAL | Status: DC
  Administered 2014-01-07 – 2014-01-08 (×2): 40 mg via ORAL
  Filled 2014-01-07 (×2): qty 1

## 2014-01-07 MED ORDER — SPIRONOLACTONE 12.5 MG HALF TABLET
12.5000 mg | ORAL_TABLET | Freq: Every day | ORAL | Status: DC
  Administered 2014-01-07 – 2014-01-08 (×2): 12.5 mg via ORAL
  Filled 2014-01-07 (×2): qty 1

## 2014-01-07 MED ORDER — NITROGLYCERIN 2 % TD OINT
0.5000 [in_us] | TOPICAL_OINTMENT | Freq: Four times a day (QID) | TRANSDERMAL | Status: DC
  Administered 2014-01-07 – 2014-01-08 (×3): 0.5 [in_us] via TOPICAL
  Filled 2014-01-07: qty 30

## 2014-01-07 NOTE — H&P (Signed)
[name] is an 58 y.o. female.   Chief Complaint: Recurrent chest pain HPI: Patient is 58 year old female with past medical history significant for hypertension, diabetes mellitus, chronic atrial fibrillation, COPD, obstructive sleep apnea/obesity hypoventilation syndrome, chronic kidney disease, history of DVT in the past, depression, degenerative joint disease, morbid obesity, came to ER complaining of retrosternal chest pain described as pressure radiating to the left arm off and on since last night states chest pressure increases with deep breathing. Denies any shortness of breath denies any palpitation but complains of occasional dizziness. No syncope. Patient states she stopped her Coumadin to 3 months ago as she was not able to go for INR checks. Denies any cough fever chills.  Past Medical History  Diagnosis Date  . HTN (hypertension)   . Chronic diastolic CHF (congestive heart failure)   . DM (diabetes mellitus)   . Hyperlipidemia   . Allergic rhinitis   . Sleep apnea     compliant with CPAP  . COPD (chronic obstructive pulmonary disease)   . GERD (gastroesophageal reflux disease)   . Headache(784.0)   . Arthritis   . Depression   . Obesity   . DVT (deep vein thrombosis) in pregnancy   . Dysrhythmia     atrial fibrilation  . Shortness of breath     Past Surgical History  Procedure Laterality Date  . Abdominal hysterectomy    . Diagnostic laparoscopy    . Knee surgery    . Cholecystectomy    . Ingrown hallux Left     Family History  Problem Relation Age of Onset  . CAD    . Cancer Mother   . Hypertension Mother   . Cancer Father   . Hypertension Sister   . Hypertension Brother    Social History:  reports that she has never smoked. She has never used smokeless tobacco. She reports that she does not drink alcohol or use illicit drugs.  Allergies:  Allergies  Allergen Reactions  . Sulfa Antibiotics Itching     (Not in a hospital  admission)  Results for orders placed during the hospital encounter of 01/07/14 (from the past 48 hour(s))  CBC WITH DIFFERENTIAL     Status: None   Collection Time    01/07/14 12:31 PM      Result Value Ref Range   WBC 5.6  4.0 - 10.5 K/uL   RBC 4.53  3.87 - 5.11 MIL/uL   Hemoglobin 13.1  12.0 - 15.0 g/dL   HCT 39.9  36.0 - 46.0 %   MCV 88.1  78.0 - 100.0 fL   MCH 28.9  26.0 - 34.0 pg   MCHC 32.8  30.0 - 36.0 g/dL   RDW 14.5  11.5 - 15.5 %   Platelets 201  150 - 400 K/uL   Neutrophils Relative % 56  43 - 77 %   Neutro Abs 3.2  1.7 - 7.7 K/uL   Lymphocytes Relative 31  12 - 46 %   Lymphs Abs 1.7  0.7 - 4.0 K/uL   Monocytes Relative 11  3 - 12 %   Monocytes Absolute 0.6  0.1 - 1.0 K/uL   Eosinophils Relative 2  0 - 5 %   Eosinophils Absolute 0.1  0.0 - 0.7 K/uL   Basophils Relative 0  0 - 1 %   Basophils Absolute 0.0  0.0 - 0.1 K/uL  BASIC METABOLIC PANEL     Status: Abnormal   Collection Time    01/07/14 12:31  PM      Result Value Ref Range   Sodium 142  137 - 147 mEq/L   Potassium 4.6  3.7 - 5.3 mEq/L   Chloride 107  96 - 112 mEq/L   CO2 25  19 - 32 mEq/L   Glucose, Bld 102 (*) 70 - 99 mg/dL   BUN 11  6 - 23 mg/dL   Creatinine, Ser 1.38 (*) 0.50 - 1.10 mg/dL   Calcium 9.4  8.4 - 10.5 mg/dL   GFR calc non Af Amer 42 (*) >90 mL/min   GFR calc Af Amer 48 (*) >90 mL/min   Comment: (NOTE)     The eGFR has been calculated using the CKD EPI equation.     This calculation has not been validated in all clinical situations.     eGFR's persistently <90 mL/min signify possible Chronic Kidney     Disease.  TROPONIN I     Status: None   Collection Time    01/07/14 12:31 PM      Result Value Ref Range   Troponin I <0.30  <0.30 ng/mL   Comment:            Due to the release kinetics of cTnI,     a negative result within the first hours     of the onset of symptoms does not rule out     myocardial infarction with certainty.     If myocardial infarction is still suspected,      repeat the test at appropriate intervals.  DIGOXIN LEVEL     Status: None   Collection Time    01/07/14 12:31 PM      Result Value Ref Range   Digoxin Level 1.1  0.8 - 2.0 ng/mL  HEPATIC FUNCTION PANEL     Status: None   Collection Time    01/07/14 12:31 PM      Result Value Ref Range   Total Protein 7.8  6.0 - 8.3 g/dL   Albumin 3.6  3.5 - 5.2 g/dL   AST 14  0 - 37 U/L   ALT 11  0 - 35 U/L   Alkaline Phosphatase 116  39 - 117 U/L   Total Bilirubin 0.6  0.3 - 1.2 mg/dL   Bilirubin, Direct <0.2  0.0 - 0.3 mg/dL   Indirect Bilirubin NOT CALCULATED  0.3 - 0.9 mg/dL  URINALYSIS, ROUTINE W REFLEX MICROSCOPIC     Status: None   Collection Time    01/07/14  1:49 PM      Result Value Ref Range   Color, Urine YELLOW  YELLOW   APPearance CLEAR  CLEAR   Specific Gravity, Urine 1.016  1.005 - 1.030   pH 6.0  5.0 - 8.0   Glucose, UA NEGATIVE  NEGATIVE mg/dL   Hgb urine dipstick NEGATIVE  NEGATIVE   Bilirubin Urine NEGATIVE  NEGATIVE   Ketones, ur NEGATIVE  NEGATIVE mg/dL   Protein, ur NEGATIVE  NEGATIVE mg/dL   Urobilinogen, UA 1.0  0.0 - 1.0 mg/dL   Nitrite NEGATIVE  NEGATIVE   Leukocytes, UA NEGATIVE  NEGATIVE   Comment: MICROSCOPIC NOT DONE ON URINES WITH NEGATIVE PROTEIN, BLOOD, LEUKOCYTES, NITRITE, OR GLUCOSE <1000 mg/dL.  CBG MONITORING, ED     Status: None   Collection Time    01/07/14  4:24 PM      Result Value Ref Range   Glucose-Capillary 82  70 - 99 mg/dL  Randolm Idol, ED     Status:  None   Collection Time    01/07/14  4:26 PM      Result Value Ref Range   Troponin i, poc 0.02  0.00 - 0.08 ng/mL   Comment 3            Comment: Due to the release kinetics of cTnI,     a negative result within the first hours     of the onset of symptoms does not rule out     myocardial infarction with certainty.     If myocardial infarction is still suspected,     repeat the test at appropriate intervals.   Dg Chest 2 View  01/07/2014   CLINICAL DATA:  Chest pain  EXAM: CHEST   2 VIEW  COMPARISON:  DG CHEST 2 VIEW dated 06/19/2013  FINDINGS: Cardiomegaly persists. The lungs are clear. Cardiac leads obscure detail. No pleural effusion. No acute osseous finding.  IMPRESSION: Cardiomegaly without focal acute finding.   Electronically Signed   By: Conchita Paris M.D.   On: 01/07/2014 16:16    Review of Systems  Constitutional: Negative for fever and chills.  Eyes: Negative for blurred vision, double vision and photophobia.  Respiratory: Negative for cough, hemoptysis and sputum production.   Cardiovascular: Positive for chest pain. Negative for palpitations, orthopnea, claudication, leg swelling and PND.  Gastrointestinal: Negative for nausea, vomiting and abdominal pain.  Genitourinary: Negative for dysuria and urgency.  Neurological: Positive for dizziness. Negative for headaches.    Blood pressure 107/58, pulse 51, temperature 98.2 F (36.8 C), temperature source Oral, resp. rate 19, height $RemoveBe'5\' 4"'BOgdVQfMi$  (1.626 m), weight 170.099 kg (375 lb), SpO2 98.00%. Physical Exam  Constitutional: She is oriented to person, place, and time.  HENT:  Head: Normocephalic and atraumatic.  Eyes: Conjunctivae are normal. Pupils are equal, round, and reactive to light. Left eye exhibits discharge. No scleral icterus.  Neck: Normal range of motion. Neck supple. No JVD present. No tracheal deviation present. No thyromegaly present.  Cardiovascular:  Irregularly irregular S1 and S2  Soft systolic murmur noted no S3 gallop  Respiratory: Effort normal and breath sounds normal. No respiratory distress. She has no wheezes. She has no rales.  GI: Soft. Bowel sounds are normal. She exhibits distension. There is no tenderness.  Musculoskeletal: She exhibits no edema and no tenderness.  Neurological: She is alert and oriented to person, place, and time.     Assessment/Plan Atypical chest pain rule out MI Hypertension Diabetes mellitus Chronic atrial fibrillation with slow ventricular response  probably secondary to meds COPD Obstructive sleep apnea/obesity hypoventilation syndrome Morbid obesity Chronic kidney disease History of DVT Depression Degenerative joint disease Plan As per orders Check old records    Clent Demark 01/07/2014, 6:12 PM

## 2014-01-07 NOTE — ED Provider Notes (Signed)
CSN: [id number]     Arrival date & time 01/07/14  1216 History   First MD Initiated Contact with Patient 01/07/14 1218     Chief Complaint  Patient presents with  . Chest Pain     (Consider location/radiation/quality/duration/timing/severity/associated sxs/prior Treatment) HPI  Patient presents to the ER with complaints of chest pains that started last night around 10pm. She has a history of morbid obesity, diabetes, hypertension, COPD, a.fib, Dysrhythmia, DVT, depression, GERD, sleep apnea, CHF. She see's Dr. Montez Morita. She reports that she was resting when the symptoms started. It feels like a pressure to her mid chest that radiates outwards towards her left arm. No SOB. She also reports feeling a bit nauseous and having a little dizziness with onset. She says that she has not been taking her Coumadin since she stopped getting her levels checked 2-3 months ago. She says " I don't have no good reason for it really".  She also reports having pain in her left knee which she says is swollen and painful which only started a few days ago.  Past Medical History  Diagnosis Date  . HTN (hypertension)   . Chronic diastolic CHF (congestive heart failure)   . DM (diabetes mellitus)   . Hyperlipidemia   . Allergic rhinitis   . Sleep apnea     compliant with CPAP  . COPD (chronic obstructive pulmonary disease)   . GERD (gastroesophageal reflux disease)   . Headache(784.0)   . Arthritis   . Depression   . Obesity   . DVT (deep vein thrombosis) in pregnancy   . Dysrhythmia     atrial fibrilation  . Shortness of breath    Past Surgical History  Procedure Laterality Date  . Abdominal hysterectomy    . Diagnostic laparoscopy    . Knee surgery    . Cholecystectomy    . Ingrown hallux Left    Family History  Problem Relation Age of Onset  . CAD    . Cancer Mother   . Hypertension Mother   . Cancer Father   . Hypertension Sister   . Hypertension Brother    History  Substance Use  Topics  . Smoking status: Never Smoker   . Smokeless tobacco: Never Used  . Alcohol Use: No   OB History   Grav Para Term Preterm Abortions TAB SAB Ect Mult Living                 Review of Systems  Review of Systems  Gen: no weight loss, fevers, chills, night sweats  Eyes: no discharge or drainage, no occular pain or visual changes  Nose: no epistaxis or rhinorrhea  Mouth: no dental pain, no sore throat  Neck: no neck pain  Lungs:No wheezing, coughing or hemoptysis CV: + chest pain, no palpitations, dependent edema or orthopnea  Abd: no abdominal pain, nausea, vomiting, diarrhea GU: no dysuria or gross hematuria  MSK:  No muscle weakness, + left knee pain Neuro: no headache, no focal neurologic deficits  Skin: no rash or wounds Psyche: no complaints     Allergies  Sulfa antibiotics  Home Medications   Prior to Admission medications   Medication Sig Start Date End Date Taking? Authorizing Provider  carvedilol (COREG) 6.25 MG tablet Take 1 tablet (6.25 mg total) by mouth 2 (two) times daily with a meal. 06/24/13   Birdie Riddle, MD  clorazepate (TRANXENE) 15 MG tablet Take 15 mg by mouth Twice daily.  09/18/11   Historical Provider,  MD  CRESTOR 10 MG tablet Take 10 mg by mouth daily.  09/13/11   Historical Provider, MD  digoxin (LANOXIN) 0.25 MG tablet Take 0.25 mg by mouth daily.    Historical Provider, MD  furosemide (LASIX) 40 MG tablet Take 20 mg by mouth daily.  09/13/11   Historical Provider, MD  gabapentin (NEURONTIN) 300 MG capsule Take 300 mg by mouth at bedtime.  09/13/11   Historical Provider, MD  insulin aspart (NOVOLOG) 100 UNIT/ML injection Inject 5-12 Units into the skin every 6 (six) hours. 200-300 takes 5 units, 301-400 takes 7 units, 401-500 takes 12 units.     Historical Provider, MD  insulin glargine (LANTUS) 100 UNIT/ML injection Inject 0.15 mLs (15 Units total) into the skin 2 (two) times daily. 06/24/13 10/13/14  Birdie Riddle, MD  JANUVIA 50 MG tablet  Take 50 mg by mouth daily.  05/27/12   Historical Provider, MD  losartan (COZAAR) 50 MG tablet Take 1 tablet by mouth daily. 05/27/12   Historical Provider, MD  neomycin-bacitracin-polymyxin (NEOSPORIN) [phone] ointment Apply 1 application topically 2 (two) times daily. Apply to big toe    Historical Provider, MD  omeprazole (PRILOSEC) 40 MG capsule Take 40 mg by mouth daily.  08/30/11   Historical Provider, MD  oxyCODONE-acetaminophen (PERCOCET/ROXICET) 5-325 MG per tablet Take 1 tablet by mouth every 4 (four) hours as needed for pain.    Historical Provider, MD  PATANOL 0.1 % ophthalmic solution Place 1 drop into both eyes 2 (two) times daily.  09/13/11   Historical Provider, MD  potassium chloride SA (K-DUR,KLOR-CON) 20 MEQ tablet Take 20 mEq by mouth Twice daily.  09/13/11   Historical Provider, MD  PROAIR HFA 108 (90 BASE) MCG/ACT inhaler Inhale 2 puffs into the lungs every 4 (four) hours as needed for wheezing.  05/28/12   Historical Provider, MD  spironolactone (ALDACTONE) 25 MG tablet Take 12.5 mg by mouth daily.  09/13/11   Historical Provider, MD   BP 125/62  Pulse 61  Temp(Src) 98.2 F (36.8 C) (Oral)  Resp 20  Ht 5\' 4"  (1.626 m)  Wt 375 lb (170.099 kg)  BMI 64.34 kg/m2  SpO2 99% Physical Exam  Nursing note and vitals reviewed. Constitutional: She appears well-developed and well-nourished. No distress.  HENT:  Head: Normocephalic and atraumatic.  Eyes: Pupils are equal, round, and reactive to light.  Neck: Normal range of motion. Neck supple.  Cardiovascular: Normal rate and regular rhythm.   Pulmonary/Chest: Effort normal.    Abdominal: Soft.  Musculoskeletal:  Exam limited to body habitus. I am unable to appreciate swelling to the left knee, however she does have tenderness to touch. NO induration or erythema. No skin abnormalities.  Neurological: She is alert.  Skin: Skin is warm and dry.    ED Course  Procedures (including critical care time) Labs Review Labs  Reviewed  CBC WITH DIFFERENTIAL  BASIC METABOLIC PANEL  TROPONIN I    Imaging Review No results found.   EKG Interpretation   Date/Time:  Wednesday Jan 07 2014 12:25:39 EDT Ventricular Rate:  66 PR Interval:    QRS Duration: 84 QT Interval:  481 QTC Calculation: 504 R Axis:   -176 Text Interpretation:  Atrial fibrillation Right axis deviation Low  voltage, precordial leads No significant change since last tracing  Confirmed by ZACKOWSKI  MD, SCOTT [id number]) on 01/07/2014 12:36:15 PM      MDM   Final diagnoses:  None    At end of shift, patient handed  off to Dr. Hillard Danker  The patient has significant risk factors that will require cardiology to come see here. Dr. Terrence Dupont has been consulted and will see the patient here in the ED. He requests I order Heparin to dose per pharmacy.   PMH + for Obesity, diabetes, a. Fib, CHF, COPD, hypertension who is not compliant with all of her medications.   Her pain is being managed in the ED but her chest xray is still pending.     Linus Mako, PA-C 01/07/14 1619  Linus Mako, PA-C 01/07/14 1620

## 2014-01-07 NOTE — ED Notes (Signed)
Patient transported to vascular. 

## 2014-01-07 NOTE — ED Notes (Signed)
Pt transported to vascular.  °

## 2014-01-07 NOTE — ED Notes (Signed)
Per [facility], pt from home for cp that started yesterday at 1000. Radiates down her left arm. Pain started while at rest. States it is a constant pressure and had some dizziness with onset. Wears O2 at home on 2 liters, [place]. Hx of afib and monitor showing the same per EMS. No IV established. Given 324 mg ASA.

## 2014-01-07 NOTE — Progress Notes (Signed)
Pt reports 7/10 chest pressure that does not radiate and is worse with inspiration or cough.  Pt reported pain improved with Morphine 4mg  in ED.  Pt not given sublingual NTG d/t BP 102/59.  ECG done.  Dr. Terrence Dupont notified, does not want to give any morphine at this time d/t pt's sleep apnea.  No new orders at this time.  Will continue to monitor.

## 2014-01-07 NOTE — ED Notes (Signed)
Pt returned from vascular

## 2014-01-07 NOTE — ED Notes (Signed)
IV attempt x2, 1 attempt was with u/s machine. Unsuccessful. IV team paged for IV start.

## 2014-01-07 NOTE — ED Notes (Signed)
Tiffany, PA at the bedside.

## 2014-01-07 NOTE — Progress Notes (Addendum)
ANTICOAGULATION CONSULT NOTE - Initial Consult  Pharmacy Consult for heparin Indication: chest pain/ACS and atrial fibrillation  Allergies  Allergen Reactions  . Sulfa Antibiotics Itching    Patient Measurements: Height: 5\' 4"  (162.6 cm) Weight: 375 lb (170.099 kg) IBW/kg (Calculated) : 54.7 Heparin Dosing Weight: 98kg  Vital Signs: Temp: 98.2 F (36.8 C) (05/20 1230) Temp src: Oral (05/20 1230) BP: 128/91 mmHg (05/20 1615) Pulse Rate: 101 (05/20 1615)  Labs:  Recent Labs  01/07/14 1231  HGB 13.1  HCT 39.9  PLT 201  CREATININE 1.38*  TROPONINI <0.30    Estimated Creatinine Clearance: 71.6 ml/min (by C-G formula based on Cr of 1.38).   Medical History: Past Medical History  Diagnosis Date  . HTN (hypertension)   . Chronic diastolic CHF (congestive heart failure)   . DM (diabetes mellitus)   . Hyperlipidemia   . Allergic rhinitis   . Sleep apnea     compliant with CPAP  . COPD (chronic obstructive pulmonary disease)   . GERD (gastroesophageal reflux disease)   . Headache(784.0)   . Arthritis   . Depression   . Obesity   . DVT (deep vein thrombosis) in pregnancy   . Dysrhythmia     atrial fibrilation  . Shortness of breath     Medications:  Infusions:  . heparin    . heparin      Assessment: 33 yof presented to the ED with CP. Also in afib with a history of afib. Previously on coumadin but pt stopped taking a few months ago. Baseline H/H and plts are WNL. To start IV heparin for CP and afib.   Goal of Therapy:  Heparin level 0.3-0.7 units/ml Monitor platelets by anticoagulation protocol: Yes   Plan:  1. Heparin bolus 5000 units IV x 1 2. Heparin gtt 1300 units/hr 3. Check a 6 hour heparin level 4. Daily heparin level and CBC 5. F/u plans for oral anticoagulation  Rande Lawman Rumbarger 01/07/2014,4:35 PM  PM Addendum Restart Warfarin 2mg  x1 Daily Protime  Bonnita Nasuti Pharm.D. CPP, BCPS Clinical Pharmacist [phone] 01/07/2014 10:18  PM

## 2014-01-07 NOTE — Progress Notes (Signed)
*  PRELIMINARY RESULTS* Vascular Ultrasound Left lower extremity venous duplex has been completed.  Preliminary findings: technically limited due to body habitus. No obvious DVT noted in visualized veins.   Landry Mellow, RDMS, RVT  01/07/2014, 2:21 PM

## 2014-01-07 NOTE — ED Notes (Signed)
PA made aware unable to obtain IV access. No further orders received at this time.

## 2014-01-08 ENCOUNTER — Other Ambulatory Visit: Payer: Self-pay

## 2014-01-08 DIAGNOSIS — R079 Chest pain, unspecified: Secondary | ICD-10-CM | POA: Diagnosis not present

## 2014-01-08 DIAGNOSIS — R0789 Other chest pain: Secondary | ICD-10-CM | POA: Diagnosis not present

## 2014-01-08 LAB — TROPONIN I
Troponin I: <0.3 ng/mL (ref <0.30)
Troponin I: <0.3 ng/mL (ref <0.30)

## 2014-01-08 LAB — BASIC METABOLIC PANEL
BUN: 11 mg/dL (ref 6–23)
CO2: 24 mEq/L (ref 19–32)
Calcium: 9.3 mg/dL (ref 8.4–10.5)
Chloride: 105 mEq/L (ref 96–112)
Creatinine, Ser: 1.29 mg/dL — ABNORMAL HIGH (ref 0.50–1.10)
GFR calc Af Amer: 52 mL/min — ABNORMAL LOW (ref >90)
GFR calc non Af Amer: 45 mL/min — ABNORMAL LOW (ref >90)
Glucose, Bld: 92 mg/dL (ref 70–99)
Potassium: 4.1 mEq/L (ref 3.7–5.3)
Sodium: 144 mEq/L (ref 137–147)

## 2014-01-08 LAB — LIPID PANEL
Cholesterol: 119 mg/dL (ref 0–200)
HDL: 43 mg/dL (ref >39)
LDL Cholesterol: 56 mg/dL (ref 0–99)
Total CHOL/HDL Ratio: 2.8 RATIO
Triglycerides: 99 mg/dL (ref <150)
VLDL: 20 mg/dL (ref 0–40)

## 2014-01-08 LAB — HEMOGLOBIN A1C
Hgb A1c MFr Bld: 7.3 % — ABNORMAL HIGH (ref <5.7)
Mean Plasma Glucose: 163 mg/dL — ABNORMAL HIGH (ref <117)

## 2014-01-08 LAB — GLUCOSE, CAPILLARY
Glucose-Capillary: 97 mg/dL (ref 70–99)
Glucose-Capillary: 111 mg/dL — ABNORMAL HIGH (ref 70–99)
Glucose-Capillary: 108 mg/dL — ABNORMAL HIGH (ref 70–99)

## 2014-01-08 LAB — CBC
HCT: 41.9 % (ref 36.0–46.0)
Hemoglobin: 13.3 g/dL (ref 12.0–15.0)
MCH: 28.7 pg (ref 26.0–34.0)
MCHC: 31.7 g/dL (ref 30.0–36.0)
MCV: 90.3 fL (ref 78.0–100.0)
Platelets: 179 10*3/uL (ref 150–400)
RBC: 4.64 MIL/uL (ref 3.87–5.11)
RDW: 14.7 % (ref 11.5–15.5)
WBC: 6.6 10*3/uL (ref 4.0–10.5)

## 2014-01-08 LAB — PROTIME-INR
INR: 1.68 — ABNORMAL HIGH (ref 0.00–1.49)
Prothrombin Time: 19.3 seconds — ABNORMAL HIGH (ref 11.6–15.2)

## 2014-01-08 LAB — HEPARIN LEVEL (UNFRACTIONATED): Heparin Unfractionated: 0.45 IU/mL (ref 0.30–0.70)

## 2014-01-08 MED ORDER — ACETAMINOPHEN 325 MG PO TABS
650.0000 mg | ORAL_TABLET | Freq: Four times a day (QID) | ORAL | Status: DC | PRN
  Administered 2014-01-08: 650 mg via ORAL
  Filled 2014-01-08: qty 2

## 2014-01-08 MED ORDER — RIVAROXABAN 20 MG PO TABS
20.0000 mg | ORAL_TABLET | Freq: Every day | ORAL | Status: DC
  Administered 2014-01-08: 20 mg via ORAL
  Filled 2014-01-08: qty 1

## 2014-01-08 MED ORDER — WARFARIN SODIUM 5 MG IV SOLR: 3.0000 mg | Freq: Once | INTRAVENOUS | Status: DC

## 2014-01-08 MED ORDER — WARFARIN SODIUM 2 MG PO TABS
2.0000 mg | ORAL_TABLET | Freq: Once | ORAL | Status: DC
  Filled 2014-01-08: qty 1

## 2014-01-08 MED ORDER — ONDANSETRON HCL 4 MG/2ML IJ SOLN
4.0000 mg | Freq: Three times a day (TID) | INTRAMUSCULAR | Status: DC | PRN
  Administered 2014-01-08: 4 mg via INTRAVENOUS
  Filled 2014-01-08: qty 2

## 2014-01-08 MED ORDER — CARVEDILOL 6.25 MG PO TABS: 3.1250 mg | ORAL_TABLET | Freq: Two times a day (BID) | ORAL | Status: DC

## 2014-01-08 MED ORDER — NITROGLYCERIN 0.4 MG SL SUBL: 0.4000 mg | SUBLINGUAL_TABLET | SUBLINGUAL | Status: DC | PRN

## 2014-01-08 MED ORDER — WARFARIN - PHYSICIAN DOSING INPATIENT: Freq: Every day | Status: DC

## 2014-01-08 MED ORDER — GLUCERNA SHAKE PO LIQD: 237.0000 mL | Freq: Two times a day (BID) | ORAL | Status: DC

## 2014-01-08 MED ORDER — WARFARIN SODIUM 3 MG PO TABS
3.0000 mg | ORAL_TABLET | Freq: Once | ORAL | Status: AC
  Administered 2014-01-08: 3 mg via ORAL
  Filled 2014-01-08: qty 1

## 2014-01-08 MED ORDER — WARFARIN SODIUM 2 MG PO TABS: 2.0000 mg | ORAL_TABLET | Freq: Every day | ORAL | Status: DC

## 2014-01-08 NOTE — Discharge Summary (Signed)
  Discharge summary dictated on 01/08/2014 J. Caryn Section number is [phone]

## 2014-01-08 NOTE — Discharge Instructions (Signed)
Information on my medicine - XARELTO (Rivaroxaban)  This medication education was reviewed with me or my healthcare representative as part of my discharge preparation.  The pharmacist that spoke with me during my hospital stay was:  Brain Hilts, [facility]  Why was Xarelto prescribed for you? Xarelto was prescribed for you to reduce the risk of a blood clot forming that can cause a stroke if you have a medical condition called atrial fibrillation (a type of irregular heartbeat).  What do you need to know about xarelto ? Take your Xarelto ONCE DAILY at the same time every day with your evening meal. If you have difficulty swallowing the tablet whole, you may crush it and mix in applesauce just prior to taking your dose.  Take Xarelto exactly as prescribed by your doctor and DO NOT stop taking Xarelto without talking to the doctor who prescribed the medication.  Stopping without other stroke prevention medication to take the place of Xarelto may increase your risk of developing a clot that causes a stroke.  Refill your prescription before you run out.  After discharge, you should have regular check-up appointments with your healthcare provider that is prescribing your Xarelto.  In the future your dose may need to be changed if your kidney function or weight changes by a significant amount.  What do you do if you miss a dose? If you are taking Xarelto ONCE DAILY and you miss a dose, take it as soon as you remember on the same day then continue your regularly scheduled once daily regimen the next day. Do not take two doses of Xarelto at the same time or on the same day.   Important Safety Information A possible side effect of Xarelto is bleeding. You should call your healthcare provider right away if you experience any of the following:   Bleeding from an injury or your nose that does not stop.   Unusual colored urine (red or dark brown) or unusual colored stools (red or black).   Unusual bruising for unknown reasons.   A serious fall or if you hit your head (even if there is no bleeding).  Some medicines may interact with Xarelto and might increase your risk of bleeding while on Xarelto. To help avoid this, consult your healthcare provider or pharmacist prior to using any new prescription or non-prescription medications, including herbals, vitamins, non-steroidal anti-inflammatory drugs (NSAIDs) and supplements.  This website has more information on Xarelto: [email or web address]   Chest Pain (Nonspecific) It is often hard to give a specific diagnosis for the cause of chest pain. There is always a chance that your pain could be related to something serious, such as a heart attack or a blood clot in the lungs. You need to follow up with your caregiver for further evaluation. CAUSES   Heartburn.  Pneumonia or bronchitis.  Anxiety or stress.  Inflammation around your heart (pericarditis) or lung (pleuritis or pleurisy).  A blood clot in the lung.  A collapsed lung (pneumothorax). It can develop suddenly on its own (spontaneous pneumothorax) or from injury (trauma) to the chest.  Shingles infection (herpes zoster virus). The chest wall is composed of bones, muscles, and cartilage. Any of these can be the source of the pain.  The bones can be bruised by injury.  The muscles or cartilage can be strained by coughing or overwork.  The cartilage can be affected by inflammation and become sore (costochondritis). DIAGNOSIS  Lab tests or other studies, such as X-rays, electrocardiography, stress testing,  or cardiac imaging, may be needed to find the cause of your pain.  TREATMENT   Treatment depends on what may be causing your chest pain. Treatment may include:  Acid blockers for heartburn.  Anti-inflammatory medicine.  Pain medicine for inflammatory conditions.  Antibiotics if an infection is present.  You may be advised to change lifestyle habits. This  includes stopping smoking and avoiding alcohol, caffeine, and chocolate.  You may be advised to keep your head raised (elevated) when sleeping. This reduces the chance of acid going backward from your stomach into your esophagus.  Most of the time, nonspecific chest pain will improve within 2 to 3 days with rest and mild pain medicine. HOME CARE INSTRUCTIONS   If antibiotics were prescribed, take your antibiotics as directed. Finish them even if you start to feel better.  For the next few days, avoid physical activities that bring on chest pain. Continue physical activities as directed.  Do not smoke.  Avoid drinking alcohol.  Only take over-the-counter or prescription medicine for pain, discomfort, or fever as directed by your caregiver.  Follow your caregiver's suggestions for further testing if your chest pain does not go away.  Keep any follow-up appointments you made. If you do not go to an appointment, you could develop lasting (chronic) problems with pain. If there is any problem keeping an appointment, you must call to reschedule. SEEK MEDICAL CARE IF:   You think you are having problems from the medicine you are taking. Read your medicine instructions carefully.  Your chest pain does not go away, even after treatment.  You develop a rash with blisters on your chest. SEEK IMMEDIATE MEDICAL CARE IF:   You have increased chest pain or pain that spreads to your arm, neck, jaw, back, or abdomen.  You develop shortness of breath, an increasing cough, or you are coughing up blood.  You have severe back or abdominal pain, feel nauseous, or vomit.  You develop severe weakness, fainting, or chills.  You have a fever. THIS IS AN EMERGENCY. Do not wait to see if the pain will go away. Get medical help at once. Call your local emergency services (911 in [place] Do not drive yourself to the hospital. MAKE SURE YOU:   Understand these instructions.  Will watch your  condition.  Will get help right away if you are not doing well or get worse. Document Released: 05/17/2005 Document Revised: 10/30/2011 Document Reviewed: 03/12/2008 [facility] Patient Information 2014 [place].

## 2014-01-08 NOTE — Progress Notes (Addendum)
Pt voiced concerns that she cannot afford $30 per month for script xarelto.  Spoke with Laddie Aquas (on 2nd floor [facility]), other med [facility] will cost about the same. Pt was on coumadin once before but stopped it 3 months ago after she stopped going to get blood draws done. Transportation had become an issue for the patient getting to the lab to have blood drawn (PT/INR) for coumadin dosing. Pt states she would have transportation arranged with someone then they would back out, she also has used [facility] transportation but she states they drop you off and its 1-2 hours before they come back to pick you up.    Case manager states pt has medicare disability and the $30 copay/month is all she can qualify for besides the 30day free dose card given to pt to have filled for first 30 days.     At 1645 ----Discussed the above info with pt, pt is willing to go back on coumadin and do what she can to find transportation to the lab for blood draws, the cost of the coumadin is much more affordable.   Left message with Amy,SW to call back, pt had questions to ask her, CM was going to contact SW to see if any transportation assistance could be available, etc. SW had not been by yet and attempted at this time to call her, message left to see pt or call RN.  Waiting for 2D echo to be read, with possible d/c home still today per previous RN.

## 2014-01-08 NOTE — Progress Notes (Signed)
Dr. Terrence Dupont notified of pt increase in pain this morning.  Pt was woken for vitals and reported feeling nauseous and lightheaded after standing for a weight.  Pt also reported an increase in chest pain from a 6/10 to an 8/10.  An ECG done at this time.  Pt BP decreased to 90/48 after pt stood up with pt feeling lightheaded.  Pt BP increased to 105/66 after pt had returned to a lying position.  No new orders placed at that time.    During report, about an hour following previous pain check, pt crying and reporting feeling more nauseous, chest pain a 9/10 and now a headache 9/10.  Dr. Terrence Dupont notified again and orders placed for Zofran 4mg  Q8 PRN and Acetaminophen 650mg  Q6 PRN.  Raquel Sarna, day shift RN, aware.

## 2014-01-08 NOTE — Progress Notes (Signed)
Dr Terrence Dupont in at this time, informed him of pt's issues and concerns about meds, finances and transportation. He spoke with pt at length about all meds for anticoagulation, pt chooses to go back on coumadin. Pt will go home this pm, she will get a coumadin dose this pm prior to d/c per Dr Terrence Dupont plans to order. Then pt will go home.

## 2014-01-08 NOTE — Progress Notes (Signed)
[place] for heparin Indication: chest pain/ACS and atrial fibrillation  Allergies  Allergen Reactions  . Sulfa Antibiotics Itching    Patient Measurements: Height: 5\' 4"  (162.6 cm) Weight: 343 lb 4.8 oz (155.72 kg) IBW/kg (Calculated) : 54.7 Heparin Dosing Weight: 98kg  Vital Signs: Temp: 97.4 F (36.3 C) (05/20 2003) Temp src: Oral (05/20 2003) BP: 102/59 mmHg (05/20 2115) Pulse Rate: 78 (05/20 2200)  Labs:  Recent Labs  01/07/14 1231 01/07/14 1829 01/07/14 1830 01/07/14 2330  HGB 13.1 13.3  --   --   HCT 39.9 40.9  --   --   PLT 201 211  --   --   LABPROT  --  20.6*  --   --   INR  --  1.83*  --   --   HEPARINUNFRC  --   --   --  0.44  CREATININE 1.38*  --   --   --   TROPONINI <0.30  --  <0.30  --     Estimated Creatinine Clearance: 67.5 ml/min (by C-G formula based on Cr of 1.38).  Assessment: 58 y.o. female with chest pain, h/o AFib off Coumadin, for heparin   Goal of Therapy:  Heparin level 0.3-0.7 units/ml Monitor platelets by anticoagulation protocol: Yes   Plan:  Continue Heparin at current rate Follow-up am labs.   Bronson Curb Rayen Palen 01/08/2014,12:34 AM

## 2014-01-08 NOTE — Progress Notes (Signed)
Completed d/c instructions with pt and her husband. Copy of instructions and 1 script given to pt. Reviewed education on afib, CHF and coumadin, handouts on all given to pt. Pt d/c'd via wheelchair with belongings with husband, family/friend taking pt and husband home, escorted by unit NT.

## 2014-01-08 NOTE — Care Management Note (Signed)
  Page 1 of 1   01/08/2014     3:28:55 PM CARE MANAGEMENT NOTE 01/08/2014  Patient:  [name], [name]   Account Number:  [id number]  Date Initiated:  01/08/2014  Documentation initiated by:  GRAVES-BIGELOW,Jaidee Stipe  Subjective/Objective Assessment:   Pt admitted for CP & afib.     Action/Plan:   Plan for d/c home on xarelto. Benefits check complete and cost after MD call for prior authorization will be 30.00. CM will provide pt a 30 day  free card Rx needs to be written.   Anticipated DC Date:  01/09/2014   Anticipated DC Plan:  [place]  CM consult      Choice offered to / List presented to:             Status of service:  In process, will continue to follow Medicare Important Message given?   (If response is "NO", the following Medicare IM given date fields will be blank) Date Medicare IM given:   Date Additional Medicare IM given:    Discharge Disposition:    Per UR Regulation:  Reviewed for med. necessity/level of care/duration of stay  If discussed at [place] of Stay Meetings, dates discussed:    Comments:  Patient states she is having transportation issues. CM did contact CSW to assisti with resources. Pt states she gets meds via pharmacy that delivers, however pt will need to go to pharmacy near by for first 30 days xarelto. No further needs from CM [phone], Jacqlyn Krauss, RN, BSN 01-08-14  Pt uses [facility] delivers from [place]. Pharmacy states that usually within a day pt can have there medications. No further needs from CM at this time.

## 2014-01-08 NOTE — Progress Notes (Signed)
Chaplain was requested by pt. Pt is anxious as to the cause of her chest pains. Pt is concerned that other family members passed before the age of 89 and is feeling some anxiety regarding her life and the fact that a diagnosis has not been determined. Chaplain provided a listening ear, prayer and emotional support. Advised pt if further spiritual services were needed to make another request.  Charyl Dancer  01/08/14 1300  Clinical Encounter Type  Visited With Patient  Visit Type Spiritual support  Referral From Nurse;Patient  Consult/Referral To Chaplain  Spiritual Encounters  Spiritual Needs Emotional;Prayer  Stress Factors  Patient Stress Factors Health changes;Lack of knowledge;Loss  Family Stress Factors None identified

## 2014-01-08 NOTE — Progress Notes (Addendum)
[place] for  Change to Xarelto Indication: chest pain/ACS and atrial fibrillation  Allergies  Allergen Reactions  . Sulfa Antibiotics Itching    Patient Measurements: Height: 5\' 4"  (162.6 cm) Weight: 343 lb 9.6 oz (155.856 kg) IBW/kg (Calculated) : 54.7 Heparin Dosing Weight: 98kg  Vital Signs: Temp: 98.3 F (36.8 C) (05/21 0500) Temp src: Oral (05/21 0500) BP: 105/66 mmHg (05/21 0647) Pulse Rate: 87 (05/21 0500)  Labs:  Recent Labs  01/07/14 1231 01/07/14 1829 01/07/14 1830 01/07/14 2330 01/08/14 0100 01/08/14 0630 01/08/14 0728  HGB 13.1 13.3  --   --   --   --  13.3  HCT 39.9 40.9  --   --   --   --  41.9  PLT 201 211  --   --   --   --  179  LABPROT  --  20.6*  --   --   --   --  19.3*  INR  --  1.83*  --   --   --   --  1.68*  HEPARINUNFRC  --   --   --  0.44  --   --  0.45  CREATININE 1.38*  --   --   --   --   --  1.29*  TROPONINI <0.30  --  <0.30  --  <0.30 <0.30  --     Estimated Creatinine Clearance: 72.3 ml/min (by C-G formula based on Cr of 1.29).  Assessment: 58 y.o. female with chest pain and history of atrial fibrillation on Couamdin and heparin. INR was sub-therapeutic on admission at 1.83 but patient had stopped taking Coumadin a few months prior so this is higher than expected. LFTs look ok. Patient restarted Coumadin last night. Heparin level is therapeutic this AM. CBC is stable. No bleeding reported.    Note patient's prior home dose was 2mg  a day except none on Sunday.  Goal of Therapy:  INR 2-3 Heparin level 0.3-0.7 units/ml Monitor platelets by anticoagulation protocol: Yes   Plan:  Continue Heparin at current rate of 1300 units/hr. Coumadin 2mg  po x1 again tonight.  Follow-up daily heparin level, CBC, and INR.   Sloan Leiter, PharmD, BCPS Clinical Pharmacist [phone] 01/08/2014,8:40 AM  Addnedum: Changing to Xarelto for atrial fibrillation due to patient having issues managing Coumadin in  the past due to transportation problems for level evaluation. estCrCl 70-37mL/min. INR 1.68- ok to start Xarelto.   Plan: D/C Coumadin D/C IV Heparin at time of Xarelto dose given. D/C heparin levels and INR.  Start Xarelto 20mg  po once daily - will give with lunch due to possible discharge today.   01/08/2014, 10:52 AM

## 2014-01-08 NOTE — Progress Notes (Signed)
  Echocardiogram 2D Echocardiogram has been performed.  [name] 01/08/2014, 12:27 PM

## 2014-01-08 NOTE — Progress Notes (Signed)
Subjective:  Patient denies any anginal chest pain complains of musculoskeletal pain. Denies any shortness of breath. States had issues with managing Coumadin and transportation.  Objective:  Vital Signs in the last 24 hours: Temp:  [97.4 F (36.3 C)-98.3 F (36.8 C)] 98.3 F (36.8 C) (05/21 0500) Pulse Rate:  [40-101] 87 (05/21 0500) Resp:  [10-20] 20 (05/21 0500) BP: (90-170)/(39-123) 105/66 mmHg (05/21 0647) SpO2:  [96 %-100 %] 100 % (05/21 0500) Weight:  [155.72 kg (343 lb 4.8 oz)-170.099 kg (375 lb)] 155.856 kg (343 lb 9.6 oz) (05/21 0500)  Intake/Output from previous day: 05/20 0701 - 05/21 0700 In: 572 [P.O.:340; I.V.:232] Out: 250 [Urine:250] Intake/Output from this shift: Total I/O In: 150 [P.O.:150] Out: -   Physical Exam: Neck: no adenopathy, no carotid bruit, no JVD and supple, symmetrical, trachea midline Lungs: clear to auscultation bilaterally Heart: irregularly irregular rhythm, S1, S2 normal and Soft systolic murmur noted Abdomen: soft, non-tender; bowel sounds normal; no masses,  no organomegaly Extremities: extremities normal, atraumatic, no cyanosis or edema  Lab Results:  Recent Labs  01/07/14 1829 01/08/14 0728  WBC 6.9 6.6  HGB 13.3 13.3  PLT 211 179    Recent Labs  01/07/14 1231 01/08/14 0728  NA 142 144  K 4.6 4.1  CL 107 105  CO2 25 24  GLUCOSE 102* 92  BUN 11 11  CREATININE 1.38* 1.29*    Recent Labs  01/08/14 0100 01/08/14 0630  TROPONINI <0.30 <0.30   Hepatic Function Panel  Recent Labs  01/07/14 1231  PROT 7.8  ALBUMIN 3.6  AST 14  ALT 11  ALKPHOS 116  BILITOT 0.6  BILIDIR <0.2  IBILI NOT CALCULATED    Recent Labs  01/08/14 0728  CHOL 119   No results found for this basename: PROTIME,  in the last 72 hours  Imaging: Imaging results have been reviewed and Dg Chest 2 View  01/07/2014   CLINICAL DATA:  Chest pain  EXAM: CHEST  2 VIEW  COMPARISON:  DG CHEST 2 VIEW dated 06/19/2013  FINDINGS: Cardiomegaly  persists. The lungs are clear. Cardiac leads obscure detail. No pleural effusion. No acute osseous finding.  IMPRESSION: Cardiomegaly without focal acute finding.   Electronically Signed   By: Conchita Paris M.D.   On: 01/07/2014 16:16    Cardiac Studies:  Assessment/Plan:  Atypical chest pain MI ruled out Hypertension  Diabetes mellitus  Chronic atrial fibrillation with slow ventricular response probably secondary to meds  COPD  Obstructive sleep apnea/obesity hypoventilation syndrome  Morbid obesity  Chronic kidney disease  History of DVT  Depression  Degenerative joint disease Plan DC IV heparin and by mouth Coumadin start Xareltto per pharmacy Check 2-D echo Possible discharge today if stable  LOS: 1 day    Clent Demark 01/08/2014, 9:24 AM

## 2014-01-08 NOTE — Progress Notes (Signed)
INITIAL NUTRITION ASSESSMENT  DOCUMENTATION CODES Per approved criteria  -Morbid Obesity   INTERVENTION:  Glucerna Shake PO BID, each supplement provides 220 kcal and 10 grams of protein  NUTRITION DIAGNOSIS: Inadequate oral intake related to poor appetite as evidenced by < 50% meal completion and reported weight loss.   Goal: Intake to meet >90% of estimated nutrition needs.  Monitor:  PO intake, labs, weight trend.  Reason for Assessment: MST  58 y.o. female  Admitting Dx: Recurrent chest pain  ASSESSMENT: Patient is 58 year old female with past medical history significant for hypertension, diabetes mellitus, chronic atrial fibrillation, COPD, obstructive sleep apnea/obesity hypoventilation syndrome, chronic kidney disease, history of DVT in the past, depression, degenerative joint disease, morbid obesity, came to ER complaining of retrosternal chest pain described as pressure radiating to the left arm off and on since last night states chest pressure increases with deep breathing.   Patient reports that she is still having chest pain. She reports that her doctor is putting her on another medication to help with chest pain. She has had a poor appetite for the past 3-6 months and has lost a lot of weight during that time unintentionally, due to poor appetite and poor intake. Suspect weight has been variable due to fluid shifts with chronic CHF.  Nutrition focused physical exam revealed no depletion of fat or muscle mass, however, difficult to assess due to morbid obesity.   Height: Ht Readings from Last 1 Encounters:  01/07/14 5\' 4"  (1.626 m)    Weight: Wt Readings from Last 1 Encounters:  01/08/14 343 lb 9.6 oz (155.856 kg)    Ideal Body Weight: 54.5 kg  % Ideal Body Weight: 286%  Wt Readings from Last 10 Encounters:  01/08/14 343 lb 9.6 oz (155.856 kg)  06/24/13 392 lb 6.7 oz (178 kg)  05/30/13 394 lb (178.717 kg)  03/26/13 386 lb 6.4 oz (175.27 kg)  01/21/13  365 lb 11.2 oz (165.88 kg)  09/06/12 400 lb 1.9 oz (181.493 kg)  06/24/12 387 lb (175.542 kg)  04/18/12 374 lb 11.2 oz (169.963 kg)  02/29/12 372 lb 6.4 oz (168.92 kg)  10/08/11 378 lb (171.46 kg)    Usual Body Weight: 392 lb (6 months ago)  % Usual Body Weight: 88%  BMI:  Body mass index is 58.95 kg/(m^2). class 3, extreme/morbid obesity  Estimated Nutritional Needs: Kcal: 1750-1950 Protein: 125 gm Fluid: 1.8 L  Skin: no problems noted  Diet Order:  Heart Healthy/CHO-modified  EDUCATION NEEDS: -Education needs addressed   Intake/Output Summary (Last 24 hours) at 01/08/14 1046 Last data filed at 01/08/14 0900  Gross per 24 hour  Intake    722 ml  Output    570 ml  Net    152 ml    Last BM: 5/20   Labs:   Recent Labs Lab 01/07/14 1231 01/07/14 1829 01/08/14 0728  NA 142  --  144  K 4.6  --  4.1  CL 107  --  105  CO2 25  --  24  BUN 11  --  11  CREATININE 1.38*  --  1.29*  CALCIUM 9.4  --  9.3  MG  --  1.8  --   GLUCOSE 102*  --  92    CBG (last 3)   Recent Labs  01/07/14 1820 01/07/14 2003 01/08/14 0801  GLUCAP 81 106* 97    Scheduled Meds: . aspirin EC  81 mg Oral Daily  . atorvastatin  40 mg Oral q1800  .  clorazepate  7.5 mg Oral BID  . furosemide  20 mg Oral Daily  . gabapentin  300 mg Oral QHS  . insulin aspart  0-9 Units Subcutaneous TID WC  . insulin glargine  5 Units Subcutaneous BID  . losartan  50 mg Oral Daily  . nitroGLYCERIN  0.5 inch Topical Q6H  . olopatadine  1 drop Both Eyes BID  . pantoprazole  40 mg Oral Daily  . potassium chloride SA  20 mEq Oral BID  . spironolactone  12.5 mg Oral Daily    Continuous Infusions: . heparin 1,300 Units/hr (01/08/14 [id number])    Past Medical History  Diagnosis Date  . HTN (hypertension)   . Chronic diastolic CHF (congestive heart failure)   . DM (diabetes mellitus)   . Hyperlipidemia   . Allergic rhinitis   . Sleep apnea     compliant with CPAP  . COPD (chronic obstructive  pulmonary disease)   . GERD (gastroesophageal reflux disease)   . Headache(784.0)   . Arthritis   . Depression   . Obesity   . DVT (deep vein thrombosis) in pregnancy   . Dysrhythmia     atrial fibrilation  . Shortness of breath     Past Surgical History  Procedure Laterality Date  . Abdominal hysterectomy    . Diagnostic laparoscopy    . Knee surgery    . Cholecystectomy    . Ingrown hallux Left     Molli Barrows, RD, LDN, [place] Pager [phone] After Hours Pager [phone]

## 2014-01-09 ENCOUNTER — Ambulatory Visit: Payer: Medicare Other

## 2014-01-09 NOTE — Discharge Summary (Signed)
[name], [name] NO.:  [id number]  MEDICAL RECORD NO.:  [id number]  LOCATION:  3W01C                        FACILITY:  [facility]  PHYSICIAN:  [name], M.D. DATE OF BIRTH:  1955/08/23  DATE OF ADMISSION:  01/07/2014 DATE OF DISCHARGE:  01/08/2014                              DISCHARGE SUMMARY   ADMITTING DIAGNOSES: 1. Atypical chest pain, rule out myocardial infarction. 2. Hypertension. 3. Diabetes mellitus. 4. Chronic atrial fibrillation with slow ventricular response,     probably secondary to meds. 5. Chronic obstructive pulmonary disease. 6. Obstructive sleep apnea/obesity, hypoventilation syndrome. 7. Morbid obesity. 8. Chronic kidney disease. 9. History of deep venous thrombosis. 10.Depression. 11.Degenerative joint disease.  DISCHARGE DIAGNOSES: 1. Atypical chest pain, myocardial infarction ruled out. 2. Hypertension. 3. Diabetes mellitus. 4. Chronic atrial fibrillation with slow ventricular response,     probably secondary to meds. 5. Chronic obstructive pulmonary disease. 6. Obstructive sleep apnea/obesity, hypoventilation syndrome. 7. Morbid obesity. 8. Chronic kidney disease. 9. History of deep venous thrombosis. 10.Depression. 11.Degenerative joint disease. 12.Rule out gastroesophageal reflux disease.  DISCHARGE HOME MEDICATIONS:  Nitrostat 0.4 mg sublingual use as directed for anginal chest pain only, restart warfarin 2 mg 1 tablet daily at 6:00 p.m., reduce the dose of carvedilol 3.125 mg twice daily.  Continue rest of the home medications i.e. Tranxene 15 mg twice daily, Crestor 10 mg daily, digoxin 0.25 mg half tablet daily, Lasix 20 mg daily, Neurontin 300 mg at bedtime, NovoLog insulin sliding scale, Lantus insulin 15 units twice daily, Januvia 50 mg daily, losartan 50 mg daily, omeprazole 40 mg daily, Percocet as needed for pain, ophthalmic eye drops Patanol as before, potassium chloride 20 mEq twice daily,  ProAir inhaler as before, spironolactone 25 mg half tab daily.  The patient has been advised to monitor blood sugar and blood pressure daily.  The patient has been advised to monitor PT/INR early next week.  FOLLOWUP:  With Dr. Montez Morita early next week.  CONDITION AT DISCHARGE:  Stable.  The patient was discussed extensively regarding novel oral anticoagulants i.e., Xarelto versus Coumadin, its risks and benefits. The patient states she cannot afford to pay 30 dollars for Xarelto every month and rather be on Coumadin.  The patient has been advised to monitor blood test regularly while on Coumadin to keep the INR in therapeutic range between 2-3.  BRIEF HISTORY AND HOSPITAL COURSE:  [name] is a 58 year old female with past medical history significant for hypertension, diabetes mellitus, chronic atrial fibrillation, COPD, obstructive sleep apnea, obesity, hypoventilation syndrome, chronic kidney disease, history of DVT in the past, depression, degenerative joint disease, morbid obesity. She came to the ER complaining of retrosternal chest pain described as pressure, radiating to left arm, off and on, since last night.  States pressue increases with deep breathing or movement.  Denies any shortness of breath.  Denies palpitation, but complains of occasional dizziness. No syncope.  The patient states she has stopped her Coumadin 3 months ago, she is not able to go to INR checked.  The patient denies any cough, fever, or chills.  PAST MEDICAL HISTORY:  As above.  PHYSICAL EXAMINATION:  GENERAL:  She was alert, awake, oriented x3. VITAL SIGNS:  Blood pressure  was 107/58, pulse was 51 irregularly irregular.  She was afebrile. HEENT:  Conjunctivae was pink. NECK:  Supple.  No JVD.  No bruit. LUNGS:  Clear to auscultation without rhonchi or rales. CARDIOVASCULAR:  Irregularly irregular.  S1, S2 were soft.  There was soft systolic murmur.  No S3 or gallop.  ABDOMEN:  Obese,  distended, nontender. EXTREMITIES:  There was no clubbing or cyanosis.  The patient had a duplex ultrasound of the lower extremities which showed no evidence of DVT.  OTHER LABS:  Three sets of cardiac enzymes were normal.  Potassium was 4.6, BUN 11, creatinine 1.38.  Hemoglobin was 13.1, hematocrit 39.9. Her PT was 20.6, INR 1.83, hemoglobin A1c was 7.3.  TSH was 2.89.  Chest x-ray showed cardiomegaly without focal acute findings.  Duplex ultrasound of lower extremity showed no evidence of DVT.  The patient also had 2-D echo done, which showed no regional wall motion abnormalities with EF of approximately 50%-55%.  BRIEF HOSPITAL COURSE:  The patient was admitted to telemetry unit.  MI was ruled out by serial enzymes and EKG.  The patient did not have any anginal chest pain, although she had musculoskeletal and pleuritic pain during the hospital stay.  The patient did not have any flu-like symptoms or cold.  The patient is clinically stable.  The patient did not have any shortness of breath during the hospital stay.  Her heart rate improved after reducing the dose of carvedilol and digoxin.  The patient will be discharged home on above medications.  The patient was started on [name] which will be stopped and converted back to Coumadin.  As above, the patient will be followed up closely as outpatient by Dr. Montez Morita. The patient did not have nuclear stress test in the past which was negative and also had cardiac cath which showed mild coronary artery disease.     [name], M.D.     MNH/MEDQ  D:  01/08/2014  T:  01/09/2014  Job:  [id number]

## 2014-01-10 NOTE — ED Provider Notes (Signed)
Medical screening examination/treatment/procedure(s) were performed by non-physician practitioner and as supervising physician I was immediately available for consultation/collaboration.   EKG Interpretation   Date/Time:  Wednesday Jan 07 2014 12:25:39 EDT Ventricular Rate:  66 PR Interval:    QRS Duration: 84 QT Interval:  481 QTC Calculation: 504 R Axis:   -176 Text Interpretation:  Atrial fibrillation Right axis deviation Low  voltage, precordial leads No significant change since last tracing  Confirmed by Karsten Howry  MD, [place] ([id number]) on 01/07/2014 12:36:15 PM        Fredia Sorrow, MD 01/10/14 1810

## 2014-01-16 ENCOUNTER — Other Ambulatory Visit: Payer: Self-pay

## 2014-01-20 ENCOUNTER — Ambulatory Visit: Payer: Medicare Other

## 2014-01-21 ENCOUNTER — Ambulatory Visit: Payer: Medicare Other

## 2014-01-23 ENCOUNTER — Ambulatory Visit: Payer: Medicare Other | Admitting: Internal Medicine

## 2014-01-27 ENCOUNTER — Encounter: Payer: Self-pay | Admitting: Internal Medicine

## 2014-02-05 ENCOUNTER — Encounter (INDEPENDENT_AMBULATORY_CARE_PROVIDER_SITE_OTHER): Payer: Medicare Other | Admitting: Ophthalmology

## 2014-02-11 ENCOUNTER — Encounter (INDEPENDENT_AMBULATORY_CARE_PROVIDER_SITE_OTHER): Payer: Medicare Other | Admitting: Ophthalmology

## 2014-04-01 ENCOUNTER — Emergency Department (HOSPITAL_COMMUNITY): Payer: Medicare HMO

## 2014-04-01 ENCOUNTER — Encounter (HOSPITAL_COMMUNITY): Payer: Self-pay | Admitting: Emergency Medicine

## 2014-04-01 ENCOUNTER — Emergency Department (HOSPITAL_COMMUNITY)
Admission: EM | Admit: 2014-04-01 | Discharge: 2014-04-01 | Disposition: A | Discharge status: Home / Self Care | Payer: Medicare HMO | Attending: Emergency Medicine | Admitting: Emergency Medicine

## 2014-04-01 DIAGNOSIS — R5381 Other malaise: Secondary | ICD-10-CM | POA: Diagnosis not present

## 2014-04-01 DIAGNOSIS — Z9089 Acquired absence of other organs: Secondary | ICD-10-CM | POA: Insufficient documentation

## 2014-04-01 DIAGNOSIS — Z8659 Personal history of other mental and behavioral disorders: Secondary | ICD-10-CM | POA: Insufficient documentation

## 2014-04-01 DIAGNOSIS — G4730 Sleep apnea, unspecified: Secondary | ICD-10-CM | POA: Diagnosis not present

## 2014-04-01 DIAGNOSIS — E785 Hyperlipidemia, unspecified: Secondary | ICD-10-CM | POA: Diagnosis not present

## 2014-04-01 DIAGNOSIS — E669 Obesity, unspecified: Secondary | ICD-10-CM | POA: Diagnosis not present

## 2014-04-01 DIAGNOSIS — I10 Essential (primary) hypertension: Secondary | ICD-10-CM | POA: Insufficient documentation

## 2014-04-01 DIAGNOSIS — R079 Chest pain, unspecified: Secondary | ICD-10-CM | POA: Insufficient documentation

## 2014-04-01 DIAGNOSIS — R791 Abnormal coagulation profile: Secondary | ICD-10-CM | POA: Insufficient documentation

## 2014-04-01 DIAGNOSIS — I5032 Chronic diastolic (congestive) heart failure: Secondary | ICD-10-CM | POA: Insufficient documentation

## 2014-04-01 DIAGNOSIS — K219 Gastro-esophageal reflux disease without esophagitis: Secondary | ICD-10-CM | POA: Diagnosis not present

## 2014-04-01 DIAGNOSIS — M7989 Other specified soft tissue disorders: Secondary | ICD-10-CM | POA: Diagnosis not present

## 2014-04-01 DIAGNOSIS — R0602 Shortness of breath: Secondary | ICD-10-CM

## 2014-04-01 DIAGNOSIS — Z8739 Personal history of other diseases of the musculoskeletal system and connective tissue: Secondary | ICD-10-CM | POA: Insufficient documentation

## 2014-04-01 DIAGNOSIS — Z794 Long term (current) use of insulin: Secondary | ICD-10-CM | POA: Insufficient documentation

## 2014-04-01 DIAGNOSIS — Z9981 Dependence on supplemental oxygen: Secondary | ICD-10-CM | POA: Diagnosis not present

## 2014-04-01 DIAGNOSIS — Z86718 Personal history of other venous thrombosis and embolism: Secondary | ICD-10-CM | POA: Insufficient documentation

## 2014-04-01 DIAGNOSIS — Z79899 Other long term (current) drug therapy: Secondary | ICD-10-CM | POA: Insufficient documentation

## 2014-04-01 DIAGNOSIS — R112 Nausea with vomiting, unspecified: Secondary | ICD-10-CM | POA: Insufficient documentation

## 2014-04-01 DIAGNOSIS — R0789 Other chest pain: Secondary | ICD-10-CM | POA: Diagnosis not present

## 2014-04-01 DIAGNOSIS — J441 Chronic obstructive pulmonary disease with (acute) exacerbation: Secondary | ICD-10-CM | POA: Insufficient documentation

## 2014-04-01 DIAGNOSIS — R630 Anorexia: Secondary | ICD-10-CM | POA: Diagnosis not present

## 2014-04-01 DIAGNOSIS — Z7901 Long term (current) use of anticoagulants: Secondary | ICD-10-CM | POA: Diagnosis not present

## 2014-04-01 DIAGNOSIS — E119 Type 2 diabetes mellitus without complications: Secondary | ICD-10-CM | POA: Insufficient documentation

## 2014-04-01 DIAGNOSIS — R5383 Other fatigue: Secondary | ICD-10-CM

## 2014-04-01 LAB — PRO B NATRIURETIC PEPTIDE: Pro B Natriuretic peptide (BNP): 452 pg/mL — ABNORMAL HIGH (ref 0–125)

## 2014-04-01 LAB — I-STAT TROPONIN, ED
Troponin i, poc: 0 ng/mL (ref 0.00–0.08)
Troponin i, poc: 0 ng/mL (ref 0.00–0.08)

## 2014-04-01 LAB — HEPATIC FUNCTION PANEL
ALT: 31 U/L (ref 0–35)
AST: 35 U/L (ref 0–37)
Albumin: 3.8 g/dL (ref 3.5–5.2)
Alkaline Phosphatase: 124 U/L — ABNORMAL HIGH (ref 39–117)
Bilirubin, Direct: 0.2 mg/dL (ref 0.0–0.3)
Indirect Bilirubin: 0.5 mg/dL (ref 0.3–0.9)
Total Bilirubin: 0.7 mg/dL (ref 0.3–1.2)
Total Protein: 8.7 g/dL — ABNORMAL HIGH (ref 6.0–8.3)

## 2014-04-01 LAB — URINALYSIS, ROUTINE W REFLEX MICROSCOPIC
Bilirubin Urine: NEGATIVE
Glucose, UA: NEGATIVE mg/dL
Hgb urine dipstick: NEGATIVE
Ketones, ur: NEGATIVE mg/dL
Leukocytes, UA: NEGATIVE
Nitrite: NEGATIVE
Protein, ur: NEGATIVE mg/dL
Specific Gravity, Urine: 1.012 (ref 1.005–1.030)
Urobilinogen, UA: 1 mg/dL (ref 0.0–1.0)
pH: 6 (ref 5.0–8.0)

## 2014-04-01 LAB — CBC
HCT: 45.1 % (ref 36.0–46.0)
Hemoglobin: 15 g/dL (ref 12.0–15.0)
MCH: 29.8 pg (ref 26.0–34.0)
MCHC: 33.3 g/dL (ref 30.0–36.0)
MCV: 89.7 fL (ref 78.0–100.0)
Platelets: 239 10*3/uL (ref 150–400)
RBC: 5.03 MIL/uL (ref 3.87–5.11)
RDW: 13.7 % (ref 11.5–15.5)
WBC: 7.9 10*3/uL (ref 4.0–10.5)

## 2014-04-01 LAB — BASIC METABOLIC PANEL
Anion gap: 14 (ref 5–15)
BUN: 11 mg/dL (ref 6–23)
CO2: 23 mEq/L (ref 19–32)
Calcium: 9.4 mg/dL (ref 8.4–10.5)
Chloride: 100 mEq/L (ref 96–112)
Creatinine, Ser: 1.22 mg/dL — ABNORMAL HIGH (ref 0.50–1.10)
GFR calc Af Amer: 56 mL/min — ABNORMAL LOW (ref >90)
GFR calc non Af Amer: 48 mL/min — ABNORMAL LOW (ref >90)
Glucose, Bld: 150 mg/dL — ABNORMAL HIGH (ref 70–99)
Potassium: 4.5 mEq/L (ref 3.7–5.3)
Sodium: 137 mEq/L (ref 137–147)

## 2014-04-01 LAB — PROTIME-INR
INR: 4.13 — ABNORMAL HIGH (ref 0.00–1.49)
Prothrombin Time: 40 seconds — ABNORMAL HIGH (ref 11.6–15.2)

## 2014-04-01 LAB — LIPASE, BLOOD: Lipase: 19 U/L (ref 11–59)

## 2014-04-01 MED ORDER — OXYCODONE-ACETAMINOPHEN 5-325 MG PO TABS
1.0000 | ORAL_TABLET | Freq: Once | ORAL | Status: AC
  Administered 2014-04-01: 1 via ORAL
  Filled 2014-04-01: qty 1

## 2014-04-01 MED ORDER — OXYCODONE-ACETAMINOPHEN 5-325 MG PO TABS: 1.0000 | ORAL_TABLET | Freq: Four times a day (QID) | ORAL | Status: DC | PRN

## 2014-04-01 NOTE — ED Notes (Signed)
Pt ambulated with cane around POD A with steady gait while maintaining oxygen saturation between 94-99% on room air. MD informed.

## 2014-04-01 NOTE — Discharge Instructions (Signed)
Please read and follow all provided instructions.  Your diagnoses today include:  1. Atypical chest pain   2. Shortness of breath   3. Supratherapeutic INR     Tests performed today include:  An EKG of your heart - atrial fib but no sign of heart attack  A chest x-ray - no pneumonia  Cardiac enzymes - a blood test for heart muscle damage, no sign of heart attack  Blood counts and electrolytes  Coumadin level - is high  Vital signs. See below for your results today.   Medications prescribed:   Percocet (oxycodone/acetaminophen) - narcotic pain medication  DO NOT drive or perform any activities that require you to be awake and alert because this medicine can make you drowsy. BE VERY CAREFUL not to take multiple medicines containing Tylenol (also called acetaminophen). Doing so can lead to an overdose which can damage your liver and cause liver failure and possibly death.  Take any prescribed medications only as directed.  Follow-up instructions: Please follow-up with your primary care provider as soon as you can for further evaluation of your symptoms.   Return instructions:  SEEK IMMEDIATE MEDICAL ATTENTION IF:  You have severe chest pain, especially if the pain is crushing or pressure-like and spreads to the arms, back, neck, or jaw, or if you have sweating, nausea (feeling sick to your stomach), or shortness of breath. THIS IS AN EMERGENCY. Don't wait to see if the pain will go away. Get medical help at once. Call 911 or 0 (operator). DO NOT drive yourself to the hospital.   Your chest pain gets worse and does not go away with rest.   You have an attack of chest pain lasting longer than usual, despite rest and treatment with the medications your caregiver has prescribed.   You wake from sleep with chest pain or shortness of breath.  You feel dizzy or faint.  You have chest pain not typical of your usual pain for which you originally saw your caregiver.   You have any  other emergent concerns regarding your health.  Additional Information: Chest pain comes from many different causes. Your caregiver has diagnosed you as having chest pain that is not specific for one problem, but does not require admission.  You are at low risk for an acute heart condition or other serious illness.   Your vital signs today were: BP 104/55   Pulse 77   Temp(Src) 97.9 F (36.6 C) (Oral)   Resp 24   Ht 5\' 4"  (1.626 m)   SpO2 94% If your blood pressure (BP) was elevated above 135/85 this visit, please have this repeated by your doctor within one month. --------------

## 2014-04-01 NOTE — ED Notes (Signed)
Pt reports hx of chf. Having mid chest pressure, sob and nausea x 1 week. Having increase in swelling to feet. Airway intact, ekg done at triage.

## 2014-04-01 NOTE — ED Notes (Signed)
Pt BP reported to RN

## 2014-04-01 NOTE — ED Provider Notes (Signed)
CSN: [id number]     Arrival date & time 04/01/14  1444 History   First MD Initiated Contact with Patient 04/01/14 1525     Chief Complaint  Patient presents with  . Chest Pain  . Shortness of Breath   (Consider location/radiation/quality/duration/timing/severity/associated sxs/prior Treatment) HPI Comments: Pt with h/o CHF, HTN, DM, afib on coumadin -- presents with c/o CP described as pressure in middle chest, SOB, worsening lower extremity edema. Chest pain present x 2 days, constant, dull, non-radiating. She has had cough productive of yellow sputum, no blood. She also complains of nausea and anorexia. Diarrhea is starting this morning. Shortness of breath worse today. She states that her bilateral lower extremity swelling is worse. She is not on oxygen at home. She denies vomiting. She had admission for atypical chest pain that similar symptoms 12/2013. No urinary symptoms. No skin rashes.  Patient is a 58 y.o. female presenting with chest pain and shortness of breath. The history is provided by the patient and medical records.  Chest Pain Associated symptoms: cough, fatigue, nausea, shortness of breath and vomiting   Associated symptoms: no abdominal pain, no fever and no headache   Shortness of Breath Associated symptoms: chest pain, cough and vomiting   Associated symptoms: no abdominal pain, no fever, no headaches, no rash and no sore throat     Past Medical History  Diagnosis Date  . HTN (hypertension)   . Chronic diastolic CHF (congestive heart failure)   . DM (diabetes mellitus)   . Hyperlipidemia   . Allergic rhinitis   . Sleep apnea     compliant with CPAP  . COPD (chronic obstructive pulmonary disease)   . GERD (gastroesophageal reflux disease)   . Headache(784.0)   . Arthritis   . Depression   . Obesity   . DVT (deep vein thrombosis) in pregnancy   . Dysrhythmia     atrial fibrilation  . Shortness of breath    Past Surgical History  Procedure Laterality Date   . Abdominal hysterectomy    . Diagnostic laparoscopy    . Knee surgery    . Cholecystectomy    . Ingrown hallux Left    Family History  Problem Relation Age of Onset  . CAD    . Cancer Mother   . Hypertension Mother   . Cancer Father   . Hypertension Sister   . Hypertension Brother    History  Substance Use Topics  . Smoking status: Never Smoker   . Smokeless tobacco: Never Used  . Alcohol Use: No   OB History   Grav Para Term Preterm Abortions TAB SAB Ect Mult Living                 Review of Systems  Constitutional: Positive for fatigue. Negative for fever.  HENT: Negative for rhinorrhea and sore throat.   Eyes: Negative for redness.  Respiratory: Positive for cough and shortness of breath.   Cardiovascular: Positive for chest pain and leg swelling.  Gastrointestinal: Positive for nausea and vomiting. Negative for abdominal pain and diarrhea.  Genitourinary: Negative for dysuria.  Musculoskeletal: Negative for myalgias.  Skin: Negative for rash.  Neurological: Negative for headaches.   Allergies  Sulfa antibiotics  Home Medications   Prior to Admission medications   Medication Sig Start Date End Date Taking? Authorizing Provider  carvedilol (COREG) 6.25 MG tablet Take 0.5 tablets (3.125 mg total) by mouth 2 (two) times daily with a meal. 01/08/14   Clent Demark, MD  clorazepate (TRANXENE) 15 MG tablet Take 15 mg by mouth Twice daily.  09/18/11   Historical Provider, MD  CRESTOR 10 MG tablet Take 10 mg by mouth daily.  09/13/11   Historical Provider, MD  digoxin (LANOXIN) 0.25 MG tablet Take 0.125 mg by mouth daily.     Historical Provider, MD  furosemide (LASIX) 40 MG tablet Take 20 mg by mouth daily.  09/13/11   Historical Provider, MD  gabapentin (NEURONTIN) 300 MG capsule Take 300 mg by mouth at bedtime.  09/13/11   Historical Provider, MD  insulin aspart (NOVOLOG) 100 UNIT/ML injection Inject 5-12 Units into the skin every 6 (six) hours. 200-300 takes 5 units,  301-400 takes 7 units, 401-500 takes 12 units.     Historical Provider, MD  insulin glargine (LANTUS) 100 UNIT/ML injection Inject 0.15 mLs (15 Units total) into the skin 2 (two) times daily. 06/24/13 10/13/14  Birdie Riddle, MD  JANUVIA 50 MG tablet Take 50 mg by mouth daily.  05/27/12   Historical Provider, MD  losartan (COZAAR) 50 MG tablet Take 1 tablet by mouth daily. 05/27/12   Historical Provider, MD  nitroGLYCERIN (NITROSTAT) 0.4 MG SL tablet Place 1 tablet (0.4 mg total) under the tongue every 5 (five) minutes x 3 doses as needed for chest pain. 01/08/14   Clent Demark, MD  omeprazole (PRILOSEC) 40 MG capsule Take 40 mg by mouth daily.  08/30/11   Historical Provider, MD  oxyCODONE-acetaminophen (PERCOCET/ROXICET) 5-325 MG per tablet Take 1 tablet by mouth every 4 (four) hours as needed for pain.    Historical Provider, MD  PATANOL 0.1 % ophthalmic solution Place 1 drop into both eyes 2 (two) times daily.  09/13/11   Historical Provider, MD  potassium chloride SA (K-DUR,KLOR-CON) 20 MEQ tablet Take 20 mEq by mouth 2 (two) times daily.  09/13/11   Historical Provider, MD  PROAIR HFA 108 (90 BASE) MCG/ACT inhaler Inhale 2 puffs into the lungs every 4 (four) hours as needed for wheezing.  05/28/12   Historical Provider, MD  spironolactone (ALDACTONE) 25 MG tablet Take 12.5 mg by mouth daily.  09/13/11   Historical Provider, MD  warfarin (COUMADIN) 2 MG tablet Take 1 tablet (2 mg total) by mouth daily at 6 PM. 01/08/14   Clent Demark, MD   BP 111/61  Pulse 81  Temp(Src) 97.9 F (36.6 C) (Oral)  Resp 18  Ht 5\' 4"  (1.626 m)  SpO2 96%  Physical Exam  Nursing note and vitals reviewed. Constitutional: She appears well-developed and well-nourished.  HENT:  Head: Normocephalic and atraumatic.  Mouth/Throat: Oropharynx is clear and moist.  Eyes: Conjunctivae are normal. Right eye exhibits no discharge. Left eye exhibits no discharge.  Neck: Normal range of motion. Neck supple.  Cardiovascular:  Normal rate, regular rhythm and normal heart sounds.   No murmur heard. Pulmonary/Chest: Breath sounds normal. No respiratory distress. She has no wheezes. She has no rales.  Abdominal: Soft. There is tenderness (generalized). There is no rebound and no guarding.  Musculoskeletal: She exhibits edema (2-3+ bilateral non-pitting edema. ). She exhibits no tenderness.  Neurological: She is alert.  Skin: Skin is warm and dry.  Psychiatric: She has a normal mood and affect.    ED Course  Procedures (including critical care time) Labs Review Labs Reviewed  BASIC METABOLIC PANEL - Abnormal; Notable for the following:    Glucose, Bld 150 (*)    Creatinine, Ser 1.22 (*)    GFR calc non Af Wyvonnia Lora  48 (*)    GFR calc Af Amer 56 (*)    All other components within normal limits  PRO B NATRIURETIC PEPTIDE - Abnormal; Notable for the following:    Pro B Natriuretic peptide (BNP) 452.0 (*)    All other components within normal limits  PROTIME-INR - Abnormal; Notable for the following:    Prothrombin Time 40.0 (*)    INR 4.13 (*)    All other components within normal limits  HEPATIC FUNCTION PANEL - Abnormal; Notable for the following:    Total Protein 8.7 (*)    Alkaline Phosphatase 124 (*)    All other components within normal limits  CBC  LIPASE, BLOOD  URINALYSIS, ROUTINE W REFLEX MICROSCOPIC  I-STAT TROPOININ, ED  Randolm Idol, ED    Imaging Review Dg Chest 2 View  04/01/2014   CLINICAL DATA:  Chest pain and shortness of breath.  EXAM: CHEST  2 VIEW  COMPARISON:  01/07/2014  FINDINGS: Cardiac silhouette is mildly enlarged. No mediastinal or hilar masses. No convincing adenopathy. Lungs are clear. No pleural effusion or pneumothorax.  Bony thorax is intact.  IMPRESSION: No acute cardiopulmonary disease.   Electronically Signed   By: Lajean Manes M.D.   On: 04/01/2014 15:57     EKG Interpretation   Date/Time:  Wednesday April 01 2014 14:48:13 EDT Ventricular Rate:  82 PR Interval:     QRS Duration: 82 QT Interval:  338 QTC Calculation: 394 R Axis:   174 Text Interpretation:  Atrial fibrillation Left posterior fascicular block  Inferior infarct , age undetermined Anterior infarct , age undetermined  Abnormal ECG No significant change since last tracing Confirmed by BEATON   MD, ROBERT [phone]) on 04/01/2014 5:05:24 PM      3:47 PM Patient seen and examined. Pt to x-ray. EKG reviewed.   Vital signs reviewed and are as follows: BP 111/61  Pulse 81  Temp(Src) 97.9 F (36.6 C) (Oral)  Resp 18  Ht 5\' 4"  (1.626 m)  SpO2 96%  5:16 PM Pt discussed with Dr. Audie Pinto who will see.   6:43 PM Hepatic function panel and lipase are normal.   I spoke with Dr. Terrence Dupont who is familiar with patient. He advises discharge to home if x-ray and labs are reassuring.   Dr. Audie Pinto performed limited bedside US to eval for pericardial effusion. No significant effusion seen.   Patient informed of all results. She seems very anxious. Will ambulate with pulse ox.   7:29 PM Patient did well with ambulation. Pulse ox normal. Delta trop ordered. Patient instructed not to take her coumadin tonight or tomorrow night because the level is high. Patient verbalizes understanding and agrees with plan.   9:21 PM Delta trop neg. Will d/c to home with pain medication.   Patient was counseled to return with severe chest pain, especially if the pain is crushing or pressure-like and spreads to the arms, back, neck, or jaw, or if they have sweating, nausea, or shortness of breath with the pain. They were encouraged to call 911 with these symptoms.   They were also told to return if their chest pain gets worse and does not go away with rest, they have an attack of chest pain lasting longer than usual despite rest and treatment with the medications their caregiver has prescribed, if they wake from sleep with chest pain or shortness of breath, if they feel dizzy or faint, if they have chest pain not typical  of their usual pain, or if  they have any other emergent concerns regarding their health.  Patient counseled on use of narcotic pain medications. Counseled not to combine these medications with others containing tylenol. Urged not to drink alcohol, drive, or perform any other activities that requires focus while taking these medications. The patient verbalizes understanding and agrees with the plan.   The patient verbalized understanding and agreed.    MDM   Final diagnoses:  Atypical chest pain  Shortness of breath  Supratherapeutic INR   Patient with chest tightness, SOB. Feel patient is low risk for ACS given history (poor story for ACS/MI), negative troponin(s), normal/unchanged EKG. No clinical signs of CHF or lab/CXR findings. CXR is neg. Doubt PE with history and pt is actually supra therapeutic and will hold coumadin for two days. No signs of pericardial effusion. No signs of pericarditis on EKG. Spoke with patient's cardiologist and reviewed findings. She is to be d/c with close PCP follow-up.   No dangerous or life-threatening conditions suspected or identified by history, physical exam, and by work-up. No indications for hospitalization identified.       Carlisle Cater, PA-C 04/01/14 2125

## 2014-04-06 NOTE — ED Provider Notes (Signed)
Medical screening examination/treatment/procedure(s) were performed by non-physician practitioner and as supervising physician I was immediately available for consultation/collaboration.    Dot Lanes, MD 04/06/14 [id number]

## 2014-06-07 ENCOUNTER — Encounter (HOSPITAL_COMMUNITY): Payer: Self-pay | Admitting: Emergency Medicine

## 2014-06-07 ENCOUNTER — Emergency Department (HOSPITAL_COMMUNITY)
Admission: EM | Admit: 2014-06-07 | Discharge: 2014-06-07 | Disposition: A | Discharge status: Home / Self Care | Payer: Medicare HMO | Attending: Emergency Medicine | Admitting: Emergency Medicine

## 2014-06-07 DIAGNOSIS — R21 Rash and other nonspecific skin eruption: Secondary | ICD-10-CM | POA: Diagnosis present

## 2014-06-07 DIAGNOSIS — Z7901 Long term (current) use of anticoagulants: Secondary | ICD-10-CM | POA: Diagnosis not present

## 2014-06-07 DIAGNOSIS — J449 Chronic obstructive pulmonary disease, unspecified: Secondary | ICD-10-CM | POA: Insufficient documentation

## 2014-06-07 DIAGNOSIS — M1990 Unspecified osteoarthritis, unspecified site: Secondary | ICD-10-CM | POA: Insufficient documentation

## 2014-06-07 DIAGNOSIS — I4891 Unspecified atrial fibrillation: Secondary | ICD-10-CM | POA: Diagnosis not present

## 2014-06-07 DIAGNOSIS — E119 Type 2 diabetes mellitus without complications: Secondary | ICD-10-CM | POA: Insufficient documentation

## 2014-06-07 DIAGNOSIS — Z86718 Personal history of other venous thrombosis and embolism: Secondary | ICD-10-CM | POA: Diagnosis not present

## 2014-06-07 DIAGNOSIS — R791 Abnormal coagulation profile: Secondary | ICD-10-CM | POA: Insufficient documentation

## 2014-06-07 DIAGNOSIS — K219 Gastro-esophageal reflux disease without esophagitis: Secondary | ICD-10-CM | POA: Insufficient documentation

## 2014-06-07 DIAGNOSIS — E669 Obesity, unspecified: Secondary | ICD-10-CM | POA: Insufficient documentation

## 2014-06-07 DIAGNOSIS — E785 Hyperlipidemia, unspecified: Secondary | ICD-10-CM | POA: Diagnosis not present

## 2014-06-07 DIAGNOSIS — G4730 Sleep apnea, unspecified: Secondary | ICD-10-CM | POA: Insufficient documentation

## 2014-06-07 DIAGNOSIS — F329 Major depressive disorder, single episode, unspecified: Secondary | ICD-10-CM | POA: Insufficient documentation

## 2014-06-07 DIAGNOSIS — I5032 Chronic diastolic (congestive) heart failure: Secondary | ICD-10-CM | POA: Diagnosis not present

## 2014-06-07 DIAGNOSIS — Z792 Long term (current) use of antibiotics: Secondary | ICD-10-CM | POA: Insufficient documentation

## 2014-06-07 DIAGNOSIS — L03115 Cellulitis of right lower limb: Secondary | ICD-10-CM

## 2014-06-07 DIAGNOSIS — Z794 Long term (current) use of insulin: Secondary | ICD-10-CM | POA: Insufficient documentation

## 2014-06-07 DIAGNOSIS — I10 Essential (primary) hypertension: Secondary | ICD-10-CM | POA: Diagnosis not present

## 2014-06-07 DIAGNOSIS — Z9989 Dependence on other enabling machines and devices: Secondary | ICD-10-CM | POA: Insufficient documentation

## 2014-06-07 DIAGNOSIS — Z79899 Other long term (current) drug therapy: Secondary | ICD-10-CM | POA: Diagnosis not present

## 2014-06-07 LAB — CBC WITH DIFFERENTIAL/PLATELET
Basophils Absolute: 0 10*3/uL (ref 0.0–0.1)
Basophils Relative: 0 % (ref 0–1)
Eosinophils Absolute: 0.2 10*3/uL (ref 0.0–0.7)
Eosinophils Relative: 3 % (ref 0–5)
HCT: 42.1 % (ref 36.0–46.0)
Hemoglobin: 13.6 g/dL (ref 12.0–15.0)
Lymphocytes Relative: 33 % (ref 12–46)
Lymphs Abs: 2.2 10*3/uL (ref 0.7–4.0)
MCH: 29.2 pg (ref 26.0–34.0)
MCHC: 32.3 g/dL (ref 30.0–36.0)
MCV: 90.3 fL (ref 78.0–100.0)
Monocytes Absolute: 0.8 10*3/uL (ref 0.1–1.0)
Monocytes Relative: 11 % (ref 3–12)
Neutro Abs: 3.7 10*3/uL (ref 1.7–7.7)
Neutrophils Relative %: 53 % (ref 43–77)
Platelets: 198 10*3/uL (ref 150–400)
RBC: 4.66 MIL/uL (ref 3.87–5.11)
RDW: 14.3 % (ref 11.5–15.5)
WBC: 6.9 10*3/uL (ref 4.0–10.5)

## 2014-06-07 LAB — BASIC METABOLIC PANEL
Anion gap: 11 (ref 5–15)
BUN: 12 mg/dL (ref 6–23)
CO2: 26 mEq/L (ref 19–32)
Calcium: 9.2 mg/dL (ref 8.4–10.5)
Chloride: 99 mEq/L (ref 96–112)
Creatinine, Ser: 1.36 mg/dL — ABNORMAL HIGH (ref 0.50–1.10)
GFR calc Af Amer: 49 mL/min — ABNORMAL LOW (ref >90)
GFR calc non Af Amer: 42 mL/min — ABNORMAL LOW (ref >90)
Glucose, Bld: 101 mg/dL — ABNORMAL HIGH (ref 70–99)
Potassium: 4.4 mEq/L (ref 3.7–5.3)
Sodium: 136 mEq/L — ABNORMAL LOW (ref 137–147)

## 2014-06-07 LAB — PROTIME-INR
INR: 4.09 — ABNORMAL HIGH (ref 0.00–1.49)
Prothrombin Time: 39.9 seconds — ABNORMAL HIGH (ref 11.6–15.2)

## 2014-06-07 LAB — CBG MONITORING, ED: Glucose-Capillary: 111 mg/dL — ABNORMAL HIGH (ref 70–99)

## 2014-06-07 MED ORDER — CLINDAMYCIN HCL 150 MG PO CAPS: 450.0000 mg | ORAL_CAPSULE | Freq: Three times a day (TID) | ORAL | Status: DC

## 2014-06-07 MED ORDER — CLINDAMYCIN PHOSPHATE 600 MG/50ML IV SOLN
600.0000 mg | Freq: Once | INTRAVENOUS | Status: AC
  Administered 2014-06-07: 600 mg via INTRAVENOUS
  Filled 2014-06-07: qty 50
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
## 2014-06-07 NOTE — Discharge Instructions (Signed)
1. Medications: clindamycin, usual home medications 2. Treatment: rest, drink plenty of fluids,  3. Follow Up: Please followup with your primary doctor in 48 hours for discussion of your diagnoses and further evaluation after today's visit;   Cellulitis Cellulitis is an infection of the skin and the tissue beneath it. The infected area is usually red and tender. Cellulitis occurs most often in the arms and lower legs.  CAUSES  Cellulitis is caused by bacteria that enter the skin through cracks or cuts in the skin. The most common types of bacteria that cause cellulitis are staphylococci and streptococci. SIGNS AND SYMPTOMS   Redness and warmth.  Swelling.  Tenderness or pain.  Fever. DIAGNOSIS  Your health care provider can usually determine what is wrong based on a physical exam. Blood tests may also be done. TREATMENT  Treatment usually involves taking an antibiotic medicine. HOME CARE INSTRUCTIONS   Take your antibiotic medicine as directed by your health care provider. Finish the antibiotic even if you start to feel better.  Keep the infected arm or leg elevated to reduce swelling.  Apply a warm cloth to the affected area up to 4 times per day to relieve pain.  Take medicines only as directed by your health care provider.  Keep all follow-up visits as directed by your health care provider. SEEK MEDICAL CARE IF:   You notice red streaks coming from the infected area.  Your red area gets larger or turns dark in color.  Your bone or joint underneath the infected area becomes painful after the skin has healed.  Your infection returns in the same area or another area.  You notice a swollen bump in the infected area.  You develop new symptoms.  You have a fever. SEEK IMMEDIATE MEDICAL CARE IF:   You feel very sleepy.  You develop vomiting or diarrhea.  You have a general ill feeling (malaise) with muscle aches and pains. MAKE SURE YOU:   Understand these  instructions.  Will watch your condition.  Will get help right away if you are not doing well or get worse. Document Released: 05/17/2005 Document Revised: 12/22/2013 Document Reviewed: 10/23/2011 [facility] Patient Information 2015 [place], [place]. This information is not intended to replace advice given to you by your health care provider. Make sure you discuss any questions you have with your health care provider.

## 2014-06-07 NOTE — ED Provider Notes (Signed)
CSN: [id number]     Arrival date & time 06/07/14  1506 History   First MD Initiated Contact with Patient 06/07/14 1707     Chief Complaint  Patient presents with  . leg redness      (Consider location/radiation/quality/duration/timing/severity/associated sxs/prior Treatment) HPI  [name] is a 58 y.o. female  with a hx of insulin-dependent diabetes, hypertension, hyperlipidemia, COPD, obesity, A. fib on Coumadin presents to the Emergency Department complaining of gradual, persistent, progressively worsening red and chief site to her right lower leg onset yesterday morning. Patient reports it started like a bug bite or pimple but then got bigger. She denies systemic symptoms including fevers, chills, nausea, vomiting. She reports there is no pain in her leg or at the site just itching. She denies history of recurrent boils or MRSA infection. No a or alleviating factors.  Pt denies fever, chills, headache, neck pain, chest pain or shortness of breath abdominal pain, nausea, vomiting, diarrhea, weakness, dizziness, syncope.     Past Medical History  Diagnosis Date  . HTN (hypertension)   . Chronic diastolic CHF (congestive Gallagher failure)   . DM (diabetes mellitus)   . Hyperlipidemia   . Allergic rhinitis   . Sleep apnea     compliant with CPAP  . COPD (chronic obstructive pulmonary disease)   . GERD (gastroesophageal reflux disease)   . Headache(784.0)   . Arthritis   . Depression   . Obesity   . DVT (deep vein thrombosis) in pregnancy   . Dysrhythmia     atrial fibrilation  . Shortness of breath    Past Surgical History  Procedure Laterality Date  . Abdominal hysterectomy    . Diagnostic laparoscopy    . Knee surgery    . Cholecystectomy    . Ingrown hallux Left    Family History  Problem Relation Age of Onset  . CAD    . Cancer Mother   . Hypertension Mother   . Cancer Father   . Hypertension Sister   . Hypertension Brother    History  Substance Use  Topics  . Smoking status: Never Smoker   . Smokeless tobacco: Never Used  . Alcohol Use: No   OB History   Grav Para Term Preterm Abortions TAB SAB Ect Mult Living                 Review of Systems  Constitutional: Negative for fever, diaphoresis, appetite change, fatigue and unexpected weight change.  HENT: Negative for mouth sores.   Eyes: Negative for visual disturbance.  Respiratory: Negative for cough, chest tightness, shortness of breath and wheezing.   Cardiovascular: Negative for chest pain.  Gastrointestinal: Negative for nausea, vomiting, abdominal pain, diarrhea and constipation.  Endocrine: Negative for polydipsia, polyphagia and polyuria.  Genitourinary: Negative for dysuria, urgency, frequency and hematuria.  Musculoskeletal: Negative for back pain and neck stiffness.  Skin: Positive for color change and wound. Negative for rash.  Allergic/Immunologic: Negative for immunocompromised state.  Neurological: Negative for syncope, light-headedness and headaches.  Hematological: Does not bruise/bleed easily.  Psychiatric/Behavioral: Negative for sleep disturbance. The patient is not nervous/anxious.       Allergies  Nsaids and Sulfa antibiotics  Home Medications   Prior to Admission medications   Medication Sig Start Date End Date Taking? Authorizing Provider  carvedilol (COREG) 6.25 MG tablet Take 3.125 mg by mouth 2 (two) times daily with a meal.   Yes Historical Provider, MD  clorazepate (TRANXENE) 15 MG tablet Take 15  mg by mouth 2 (two) times daily.  09/18/11  Yes Historical Provider, MD  digoxin (LANOXIN) 0.25 MG tablet Take 0.125 mg by mouth daily.    Yes Historical Provider, MD  furosemide (LASIX) 40 MG tablet Take 20 mg by mouth daily.  09/13/11  Yes Historical Provider, MD  gabapentin (NEURONTIN) 300 MG capsule Take 300 mg by mouth at bedtime.  09/13/11  Yes Historical Provider, MD  insulin aspart (NOVOLOG) 100 UNIT/ML injection Inject 5-10 Units into the skin  every 6 (six) hours. 200-300 takes 5 units, 301-400 takes 7 units, 401-500 takes 12 units. Sliding Scale   Yes Historical Provider, MD  insulin detemir (LEVEMIR) 100 UNIT/ML injection Inject 5 Units into the skin 2 (two) times daily. On Sliding Scale   Yes Historical Provider, MD  losartan (COZAAR) 50 MG tablet Take 50 mg by mouth daily with breakfast.  05/27/12  Yes Historical Provider, MD  nitroGLYCERIN (NITROSTAT) 0.4 MG SL tablet Place 1 tablet (0.4 mg total) under the tongue every 5 (five) minutes x 3 doses as needed for chest pain. 01/08/14  Yes Clent Demark, MD  omeprazole (PRILOSEC) 40 MG capsule Take 40 mg by mouth daily.  08/30/11  Yes Historical Provider, MD  oxyCODONE-acetaminophen (PERCOCET/ROXICET) 5-325 MG per tablet Take 1 tablet by mouth every 6 (six) hours as needed for severe pain (pain).   Yes Historical Provider, MD  PATANOL 0.1 % ophthalmic solution Place 1 drop into both eyes 2 (two) times daily.  09/13/11  Yes Historical Provider, MD  potassium chloride SA (K-DUR,KLOR-CON) 20 MEQ tablet Take 20 mEq by mouth 2 (two) times daily.  09/13/11  Yes Historical Provider, MD  PROAIR HFA 108 (90 BASE) MCG/ACT inhaler Inhale 2 puffs into the lungs every 4 (four) hours as needed for wheezing (wheezing).  05/28/12  Yes Historical Provider, MD  rosuvastatin (CRESTOR) 10 MG tablet Take 10 mg by mouth daily with breakfast.   Yes Historical Provider, MD  sitaGLIPtin (JANUVIA) 50 MG tablet Take 50 mg by mouth daily with breakfast.   Yes Historical Provider, MD  spironolactone (ALDACTONE) 25 MG tablet Take 12.5 mg by mouth daily.  09/13/11  Yes Historical Provider, MD  warfarin (COUMADIN) 2 MG tablet Take 1-2 mg by mouth at bedtime. Takes 1 mg on Sun only & Takes 2mg  on all other days (Mon, Tues, Wed, Thur, Fri, Sat)   Yes Historical Provider, MD  clindamycin (CLEOCIN) 150 MG capsule Take 3 capsules (450 mg total) by mouth 3 (three) times daily. 06/07/14   Alfretta Pinch, PA-C   BP 138/73   Pulse 78  Temp(Src) 97.4 F (36.3 C) (Oral)  Resp 16  SpO2 100% Physical Exam  Nursing note and vitals reviewed. Constitutional: She is oriented to person, place, and time. She appears well-developed and well-nourished. No distress.  Awake, alert, nontoxic appearance Obese  HENT:  Head: Normocephalic and atraumatic.  Mouth/Throat: Oropharynx is clear and moist. No oropharyngeal exudate.  Eyes: Conjunctivae are normal. No scleral icterus.  Neck: Normal range of motion. Neck supple.  Cardiovascular: Normal rate, regular rhythm, normal Gallagher sounds and intact distal pulses.   No murmur heard. Pulmonary/Chest: Effort normal and breath sounds normal. No respiratory distress. She has no wheezes.  Equal chest expansion  Abdominal: Soft. Bowel sounds are normal. She exhibits no distension and no mass. There is no tenderness. There is no rebound and no guarding.  Musculoskeletal: Normal range of motion. She exhibits no edema.  Lymphadenopathy:    She has no  cervical adenopathy.  Neurological: She is alert and oriented to person, place, and time.  Speech is clear and goal oriented Moves extremities without ataxia  Skin: Skin is warm and dry. She is not diaphoretic. There is erythema.  6x6cm area of erythema without induration or fluctuance, mild increased warmth of the site, no tenderness to palpation, no streaking  Psychiatric: She has a normal mood and affect.    ED Course  Procedures (including critical care time) Labs Review Labs Reviewed  BASIC METABOLIC PANEL - Abnormal; Notable for the following:    Sodium 136 (*)    Glucose, Bld 101 (*)    Creatinine, Ser 1.36 (*)    GFR calc non Af Amer 42 (*)    GFR calc Af Amer 49 (*)    All other components within normal limits  PROTIME-INR - Abnormal; Notable for the following:    Prothrombin Time 39.9 (*)    INR 4.09 (*)    All other components within normal limits  CBG MONITORING, ED - Abnormal; Notable for the following:     Glucose-Capillary 111 (*)    All other components within normal limits  CBC WITH DIFFERENTIAL    Imaging Review No results found.   EKG Interpretation None      EMERGENCY DEPARTMENT US SOFT TISSUE INTERPRETATION "Study: Limited Ultrasound of the noted body part in comments below"  INDICATIONS: Pain and Soft tissue infection Multiple views of the body part are obtained with a multi-frequency linear probe  PERFORMED BY:  Myself  IMAGES ARCHIVED?: Yes  SIDE:Right   BODY PART:Lower extremity  FINDINGS: No abcess noted  LIMITATIONS:  Body Habitus  INTERPRETATION:  No abcess noted and Cellulitis present  COMMENT:  Very small fluid collection of < 0.5cm that is not amenable to I&D; cellulitis present    MDM   Final diagnoses:  Cellulitis of right lower extremity  Elevated INR   [name] presents with area of cellulitis to her right lower leg after questionable bug bite versus skin break.  Patient reports she does not shave her legs.  The patient has insulin dependent diabetes her she reports her blood sugars have been in the 90s to 100s the last several months.  No systemic signs of infection. Will check blood work, give clindamycin IV and reassess.  7:44 PM Labs reassuring.  No signs of systemic signs of infection.  Pt is without risk factors for HIV; no recent use of steroids or other immunosuppressive medications; Hx of diabetes and CBG is 111 today.  Pt is without gross abscess for which I&D would be possible.  Area marked and pt encouraged to return if redness begins to streak, extends beyond the markings, and/or fever or nausea/vomiting develop.  Patient given IV clindamycin here and will be discharged home with the same. Pt is alert, oriented, NAD, afebrile, non tachycardic, nonseptic and nontoxic appearing.  Pt to be d/c on oral antibiotics with strict f/u instructions for wound recheck in 48 hours.  BP 151/95  Pulse 65  Temp(Src) 97.7 F (36.5 C)  (Oral)  Resp 18  SpO2 100%  8:16 PM Patient with elevated INR to 4.09. She is to follow up in 48 hours for wound check. She is to hold her Coumadin until that time for which she should have her INR rechecked. She is to return to the emergency department within 48 hours if symptoms worsen, if she develops systemic symptoms or redness spreads outside the line drawn.  BP 138/73  Pulse  78  Temp(Src) 97.4 F (36.3 C) (Oral)  Resp 16  SpO2 100%     Abigail Butts, PA-C 06/07/14 2017

## 2014-06-07 NOTE — ED Notes (Signed)
MD at bedside. 

## 2014-06-08 NOTE — ED Provider Notes (Signed)
Medical screening examination/treatment/procedure(s) were performed by non-physician practitioner and as supervising physician I was immediately available for consultation/collaboration.  Ernestina Patches, MD 06/08/14 [phone]

## 2014-08-24 ENCOUNTER — Ambulatory Visit: Payer: Medicare Other

## 2014-10-25 ENCOUNTER — Emergency Department (HOSPITAL_COMMUNITY)
Admission: EM | Admit: 2014-10-25 | Discharge: 2014-10-26 | Disposition: A | Discharge status: Home / Self Care | Payer: Medicare HMO | Attending: Emergency Medicine | Admitting: Emergency Medicine

## 2014-10-25 ENCOUNTER — Encounter (HOSPITAL_COMMUNITY): Payer: Self-pay | Admitting: *Deleted

## 2014-10-25 DIAGNOSIS — R079 Chest pain, unspecified: Secondary | ICD-10-CM | POA: Diagnosis present

## 2014-10-25 DIAGNOSIS — E119 Type 2 diabetes mellitus without complications: Secondary | ICD-10-CM | POA: Insufficient documentation

## 2014-10-25 DIAGNOSIS — I499 Cardiac arrhythmia, unspecified: Secondary | ICD-10-CM | POA: Insufficient documentation

## 2014-10-25 DIAGNOSIS — I5032 Chronic diastolic (congestive) heart failure: Secondary | ICD-10-CM | POA: Insufficient documentation

## 2014-10-25 DIAGNOSIS — E785 Hyperlipidemia, unspecified: Secondary | ICD-10-CM | POA: Insufficient documentation

## 2014-10-25 DIAGNOSIS — Z8739 Personal history of other diseases of the musculoskeletal system and connective tissue: Secondary | ICD-10-CM | POA: Insufficient documentation

## 2014-10-25 DIAGNOSIS — G4730 Sleep apnea, unspecified: Secondary | ICD-10-CM | POA: Diagnosis not present

## 2014-10-25 DIAGNOSIS — Z7902 Long term (current) use of antithrombotics/antiplatelets: Secondary | ICD-10-CM | POA: Diagnosis not present

## 2014-10-25 DIAGNOSIS — Z86718 Personal history of other venous thrombosis and embolism: Secondary | ICD-10-CM | POA: Diagnosis not present

## 2014-10-25 DIAGNOSIS — E669 Obesity, unspecified: Secondary | ICD-10-CM | POA: Insufficient documentation

## 2014-10-25 DIAGNOSIS — I10 Essential (primary) hypertension: Secondary | ICD-10-CM | POA: Insufficient documentation

## 2014-10-25 DIAGNOSIS — K219 Gastro-esophageal reflux disease without esophagitis: Secondary | ICD-10-CM | POA: Insufficient documentation

## 2014-10-25 DIAGNOSIS — Z794 Long term (current) use of insulin: Secondary | ICD-10-CM | POA: Diagnosis not present

## 2014-10-25 DIAGNOSIS — J069 Acute upper respiratory infection, unspecified: Secondary | ICD-10-CM | POA: Diagnosis not present

## 2014-10-25 DIAGNOSIS — B9789 Other viral agents as the cause of diseases classified elsewhere: Secondary | ICD-10-CM

## 2014-10-25 DIAGNOSIS — Z9981 Dependence on supplemental oxygen: Secondary | ICD-10-CM | POA: Insufficient documentation

## 2014-10-25 DIAGNOSIS — Z8659 Personal history of other mental and behavioral disorders: Secondary | ICD-10-CM | POA: Diagnosis not present

## 2014-10-25 DIAGNOSIS — J449 Chronic obstructive pulmonary disease, unspecified: Secondary | ICD-10-CM | POA: Insufficient documentation

## 2014-10-25 NOTE — ED Notes (Signed)
Pt had N/V on Friday and developed substernal CP on Saturday. Pt. Took a nitro yesterday. The nitro reduced the pain but gave her a headache. Called EMS today for CP.

## 2014-10-26 ENCOUNTER — Emergency Department (HOSPITAL_COMMUNITY): Payer: Medicare HMO

## 2014-10-26 LAB — CBC WITH DIFFERENTIAL/PLATELET
Basophils Absolute: 0 10*3/uL (ref 0.0–0.1)
Basophils Relative: 0 % (ref 0–1)
Eosinophils Absolute: 0.1 10*3/uL (ref 0.0–0.7)
Eosinophils Relative: 1 % (ref 0–5)
HCT: 42.8 % (ref 36.0–46.0)
Hemoglobin: 14.2 g/dL (ref 12.0–15.0)
Lymphocytes Relative: 23 % (ref 12–46)
Lymphs Abs: 2 10*3/uL (ref 0.7–4.0)
MCH: 29.4 pg (ref 26.0–34.0)
MCHC: 33.2 g/dL (ref 30.0–36.0)
MCV: 88.6 fL (ref 78.0–100.0)
Monocytes Absolute: 1 10*3/uL (ref 0.1–1.0)
Monocytes Relative: 12 % (ref 3–12)
Neutro Abs: 5.5 10*3/uL (ref 1.7–7.7)
Neutrophils Relative %: 64 % (ref 43–77)
Platelets: 200 10*3/uL (ref 150–400)
RBC: 4.83 MIL/uL (ref 3.87–5.11)
RDW: 13.1 % (ref 11.5–15.5)
WBC: 8.6 10*3/uL (ref 4.0–10.5)

## 2014-10-26 LAB — I-STAT TROPONIN, ED
Troponin i, poc: 0 ng/mL (ref 0.00–0.08)
Troponin i, poc: 0 ng/mL (ref 0.00–0.08)

## 2014-10-26 LAB — COMPREHENSIVE METABOLIC PANEL
ALT: 41 U/L — ABNORMAL HIGH (ref 0–35)
AST: 33 U/L (ref 0–37)
Albumin: 3.4 g/dL — ABNORMAL LOW (ref 3.5–5.2)
Alkaline Phosphatase: 83 U/L (ref 39–117)
Anion gap: 6 (ref 5–15)
BUN: 11 mg/dL (ref 6–23)
CO2: 32 mmol/L (ref 19–32)
Calcium: 9.1 mg/dL (ref 8.4–10.5)
Chloride: 100 mmol/L (ref 96–112)
Creatinine, Ser: 1.37 mg/dL — ABNORMAL HIGH (ref 0.50–1.10)
GFR calc Af Amer: 48 mL/min — ABNORMAL LOW (ref >90)
GFR calc non Af Amer: 42 mL/min — ABNORMAL LOW (ref >90)
Glucose, Bld: 107 mg/dL — ABNORMAL HIGH (ref 70–99)
Potassium: 4.2 mmol/L (ref 3.5–5.1)
Sodium: 138 mmol/L (ref 135–145)
Total Bilirubin: 1.2 mg/dL (ref 0.3–1.2)
Total Protein: 7.7 g/dL (ref 6.0–8.3)

## 2014-10-26 LAB — BASIC METABOLIC PANEL
Anion gap: 5 (ref 5–15)
BUN: 11 mg/dL (ref 6–23)
CO2: 33 mmol/L — ABNORMAL HIGH (ref 19–32)
Calcium: 9 mg/dL (ref 8.4–10.5)
Chloride: 101 mmol/L (ref 96–112)
Creatinine, Ser: 1.4 mg/dL — ABNORMAL HIGH (ref 0.50–1.10)
GFR calc Af Amer: 47 mL/min — ABNORMAL LOW (ref >90)
GFR calc non Af Amer: 41 mL/min — ABNORMAL LOW (ref >90)
Glucose, Bld: 113 mg/dL — ABNORMAL HIGH (ref 70–99)
Potassium: 4 mmol/L (ref 3.5–5.1)
Sodium: 139 mmol/L (ref 135–145)

## 2014-10-26 LAB — URINALYSIS, ROUTINE W REFLEX MICROSCOPIC
Glucose, UA: NEGATIVE mg/dL
Hgb urine dipstick: NEGATIVE
Ketones, ur: 15 mg/dL — AB
Leukocytes, UA: NEGATIVE
Nitrite: NEGATIVE
Protein, ur: NEGATIVE mg/dL
Specific Gravity, Urine: 1.019 (ref 1.005–1.030)
Urobilinogen, UA: 1 mg/dL (ref 0.0–1.0)
pH: 5.5 (ref 5.0–8.0)

## 2014-10-26 LAB — LIPASE, BLOOD: Lipase: 19 U/L (ref 11–59)

## 2014-10-26 LAB — PROTIME-INR
INR: 1.64 — ABNORMAL HIGH (ref 0.00–1.49)
Prothrombin Time: 19.6 seconds — ABNORMAL HIGH (ref 11.6–15.2)

## 2014-10-26 LAB — I-STAT CG4 LACTIC ACID, ED: Lactic Acid, Venous: 1.02 mmol/L (ref 0.5–2.0)

## 2014-10-26 MED ORDER — ONDANSETRON 4 MG PO TBDP: 4.0000 mg | ORAL_TABLET | Freq: Three times a day (TID) | ORAL | Status: DC | PRN

## 2014-10-26 MED ORDER — ACETAMINOPHEN 500 MG PO TABS
1000.0000 mg | ORAL_TABLET | Freq: Once | ORAL | Status: AC
  Administered 2014-10-26: 1000 mg via ORAL
  Filled 2014-10-26: qty 2

## 2014-10-26 MED ORDER — MORPHINE SULFATE 4 MG/ML IJ SOLN
4.0000 mg | Freq: Once | INTRAMUSCULAR | Status: AC
  Administered 2014-10-26: 4 mg via INTRAVENOUS
  Filled 2014-10-26: qty 1

## 2014-10-26 MED ORDER — ONDANSETRON HCL 4 MG/2ML IJ SOLN
4.0000 mg | Freq: Once | INTRAMUSCULAR | Status: AC
  Administered 2014-10-26: 4 mg via INTRAVENOUS
  Filled 2014-10-26: qty 2

## 2014-10-26 NOTE — ED Notes (Signed)
[place]. Pt sister. Call if pt. Gets admitted or discharged

## 2014-10-26 NOTE — ED Notes (Signed)
Pt. Dressed and ready to go. Pt. States she started vomiting and sweating. RN took pt. Temp 98.0.

## 2014-10-26 NOTE — Discharge Instructions (Signed)
Chest Pain (Nonspecific) [name], your EKG and blood work were normal throughout her stay in the emergency department. You may also have the flu with your symptoms of coughing and body aches. Follow-up with your primary physician within 3 days for continued management. If symptoms worsen come back to the emergency department mainly. Thank you. It is often hard to give a diagnosis for the cause of chest pain. There is always a chance that your pain could be related to something serious, such as a heart attack or a blood clot in the lungs. You need to follow up with your doctor. HOME CARE  If antibiotic medicine was given, take it as directed by your doctor. Finish the medicine even if you start to feel better.  For the next few days, avoid activities that bring on chest pain. Continue physical activities as told by your doctor.  Do not use any tobacco products. This includes cigarettes, chewing tobacco, and e-cigarettes.  Avoid drinking alcohol.  Only take medicine as told by your doctor.  Follow your doctor's suggestions for more testing if your chest pain does not go away.  Keep all doctor visits you made. GET HELP IF:  Your chest pain does not go away, even after treatment.  You have a rash with blisters on your chest.  You have a fever. GET HELP RIGHT AWAY IF:   You have more pain or pain that spreads to your arm, neck, jaw, back, or belly (abdomen).  You have shortness of breath.  You cough more than usual or cough up blood.  You have very bad back or belly pain.  You feel sick to your stomach (nauseous) or throw up (vomit).  You have very bad weakness.  You pass out (faint).  You have chills. This is an emergency. Do not wait to see if the problems will go away. Call your local emergency services (911 in [place] Do not drive yourself to the hospital. MAKE SURE YOU:   Understand these instructions.  Will watch your condition.  Will get help right away if  you are not doing well or get worse. Document Released: 01/24/2008 Document Revised: 08/12/2013 Document Reviewed: 01/24/2008 [facility] Patient Information 2015 [place], [place]. This information is not intended to replace advice given to you by your health care provider. Make sure you discuss any questions you have with your health care provider. Upper Respiratory Infection, Adult An upper respiratory infection (URI) is also known as the common cold. It is often caused by a type of germ (virus). Colds are easily spread (contagious). You can pass it to others by kissing, coughing, sneezing, or drinking out of the same glass. Usually, you get better in 1 or 2 weeks.  HOME CARE   Only take medicine as told by your doctor.  Use a warm mist humidifier or breathe in steam from a hot shower.  Drink enough water and fluids to keep your pee (urine) clear or pale yellow.  Get plenty of rest.  Return to work when your temperature is back to normal or as told by your doctor. You may use a face mask and wash your hands to stop your cold from spreading. GET HELP RIGHT AWAY IF:   After the first few days, you feel you are getting worse.  You have questions about your medicine.  You have chills, shortness of breath, or brown or red spit (mucus).  You have yellow or brown snot (nasal discharge) or pain in the face, especially when you  bend forward.  You have a fever, puffy (swollen) neck, pain when you swallow, or white spots in the back of your throat.  You have a bad headache, ear pain, sinus pain, or chest pain.  You have a high-pitched whistling sound when you breathe in and out (wheezing).  You have a lasting cough or cough up blood.  You have sore muscles or a stiff neck. MAKE SURE YOU:   Understand these instructions.  Will watch your condition.  Will get help right away if you are not doing well or get worse. Document Released: 01/24/2008 Document Revised: 10/30/2011 Document  Reviewed: 11/12/2013 [facility] Patient Information 2015 [place], [place]. This information is not intended to replace advice given to you by your health care provider. Make sure you discuss any questions you have with your health care provider.

## 2014-10-26 NOTE — ED Provider Notes (Signed)
CSN: [id number]     Arrival date & time 10/25/14  2333 History  This chart was scribed for Everlene Balls, MD by Eustaquio Maize, ED Scribe. This patient was seen in room B15C/B15C and the patient's care was started at 12:25 AM.    Chief Complaint  Patient presents with  . Chest Pain    The history is provided by the patient. No language interpreter was used.     HPI Comments: [name] is a 59 y.o. female with past medical history of HTN, DM, HLD, and COPD who presents to the Emergency Department complaining of substernal chest pain that began 2 days ago. Pt describes it as a tightness in her chest. She reports that it radiates to her breasts. She claims that the pain is exacerbated with breathing. Pt also complains of diffuse abdominal pain that began 2 days ago as well. She mentions that she has had nausea, vomiting, loss of appetite, and diaphoresis. Pt mentions that she has also had a cough and rhinorrhea recently. Family member states that pt was having chills as well. She states that she has had chest pain before, but that it feels different now with the tightness. Pt denies taking any pain medication for her symptoms. Pt admits that she did not receive the flu vaccine this year. She is currently on an anticoagulant but cannot say which one. She denies shortness of breath, diarrhea, urinary changes, hematuria, hematochezia, or any other symptoms.    PCP - [facility]    Past Medical History  Diagnosis Date  . HTN (hypertension)   . Chronic diastolic CHF (congestive heart failure)   . DM (diabetes mellitus)   . Hyperlipidemia   . Allergic rhinitis   . Sleep apnea     compliant with CPAP  . COPD (chronic obstructive pulmonary disease)   . GERD (gastroesophageal reflux disease)   . Headache(784.0)   . Arthritis   . Depression   . Obesity   . DVT (deep vein thrombosis) in pregnancy   . Dysrhythmia     atrial fibrilation  . Shortness of breath    Past Surgical History   Procedure Laterality Date  . Abdominal hysterectomy    . Diagnostic laparoscopy    . Knee surgery    . Cholecystectomy    . Ingrown hallux Left    Family History  Problem Relation Age of Onset  . CAD    . Cancer Mother   . Hypertension Mother   . Cancer Father   . Hypertension Sister   . Hypertension Brother    History  Substance Use Topics  . Smoking status: Never Smoker   . Smokeless tobacco: Never Used  . Alcohol Use: No   OB History    No data available     Review of Systems  10 Systems reviewed and all are negative for acute change except as noted in the HPI.    Allergies  Nsaids and Sulfa antibiotics  Home Medications   Prior to Admission medications   Medication Sig Start Date End Date Taking? Authorizing Provider  carvedilol (COREG) 12.5 MG tablet Take 12.5 mg by mouth 2 (two) times daily with a meal.   Yes Historical Provider, MD  clorazepate (TRANXENE) 7.5 MG tablet Take 7.5 mg by mouth daily.   Yes Historical Provider, MD  dabigatran (PRADAXA) 75 MG CAPS capsule Take 75 mg by mouth 2 (two) times daily.   Yes Historical Provider, MD  digoxin (LANOXIN) 0.25 MG tablet Take 0.25 mg  by mouth daily.    Yes Historical Provider, MD  furosemide (LASIX) 40 MG tablet Take 40 mg by mouth daily.  09/13/11  Yes Historical Provider, MD  gabapentin (NEURONTIN) 300 MG capsule Take 300 mg by mouth at bedtime.  09/13/11  Yes Historical Provider, MD  insulin detemir (LEVEMIR) 100 UNIT/ML injection Inject 5 Units into the skin 2 (two) times daily as needed (blood sugar over 200).    Yes Historical Provider, MD  losartan (COZAAR) 50 MG tablet Take 50 mg by mouth daily with breakfast.  05/27/12  Yes Historical Provider, MD  nitroGLYCERIN (NITROSTAT) 0.4 MG SL tablet Place 1 tablet (0.4 mg total) under the tongue every 5 (five) minutes x 3 doses as needed for chest pain. 01/08/14  Yes Clent Demark, MD  omeprazole (PRILOSEC) 40 MG capsule Take 40 mg by mouth daily.  08/30/11  Yes  Historical Provider, MD  PATANOL 0.1 % ophthalmic solution Place 1 drop into both eyes 2 (two) times daily.  09/13/11  Yes Historical Provider, MD  potassium chloride SA (K-DUR,KLOR-CON) 20 MEQ tablet Take 20 mEq by mouth 2 (two) times daily.  09/13/11  Yes Historical Provider, MD  PROAIR HFA 108 (90 BASE) MCG/ACT inhaler Inhale 2 puffs into the lungs every 4 (four) hours as needed for wheezing (wheezing).  05/28/12  Yes Historical Provider, MD  rosuvastatin (CRESTOR) 10 MG tablet Take 10 mg by mouth daily with breakfast.   Yes Historical Provider, MD  sitaGLIPtin (JANUVIA) 50 MG tablet Take 50 mg by mouth daily with breakfast.   Yes Historical Provider, MD  spironolactone (ALDACTONE) 25 MG tablet Take 25 mg by mouth daily.  09/13/11  Yes Historical Provider, MD  carvedilol (COREG) 6.25 MG tablet Take 3.125 mg by mouth 2 (two) times daily with a meal.    Historical Provider, MD  clindamycin (CLEOCIN) 150 MG capsule Take 3 capsules (450 mg total) by mouth 3 (three) times daily. Patient not taking: Reported on 10/25/2014 06/07/14   Jarrett Soho Muthersbaugh, PA-C  clorazepate (TRANXENE) 15 MG tablet Take 15 mg by mouth 2 (two) times daily.  09/18/11   Historical Provider, MD  insulin aspart (NOVOLOG) 100 UNIT/ML injection Inject 5-10 Units into the skin every 6 (six) hours. 200-300 takes 5 units, 301-400 takes 7 units, 401-500 takes 12 units. Sliding Scale    Historical Provider, MD  oxyCODONE-acetaminophen (PERCOCET/ROXICET) 5-325 MG per tablet Take 1 tablet by mouth every 6 (six) hours as needed for severe pain (pain).    Historical Provider, MD  warfarin (COUMADIN) 2 MG tablet Take 1-2 mg by mouth at bedtime. Takes 1 mg on Sun only & Takes 2mg  on all other days (Mon, Tues, Wed, Thur, Fri, Sat)    Historical Provider, MD   Triage Vitals: BP 123/70 mmHg  Pulse 56  Temp(Src) 97.8 F (36.6 C) (Oral)  Resp 24  Ht 5\' 4"  (1.626 m)  Wt 307 lb (139.254 kg)  BMI 52.67 kg/m2  SpO2 96%   Physical Exam   Constitutional: She is oriented to person, place, and time. She appears well-developed. No distress.  Obese.  HENT:  Head: Normocephalic and atraumatic.  Nose: Nose normal.  Mouth/Throat: Oropharynx is clear and moist. No oropharyngeal exudate.  Eyes: Conjunctivae and EOM are normal. Pupils are equal, round, and reactive to light. No scleral icterus.  Neck: Normal range of motion. Neck supple. No JVD present. No tracheal deviation present. No thyromegaly present.  Cardiovascular: Normal rate and normal heart sounds.  An irregularly irregular  rhythm present. Exam reveals no gallop and no friction rub.   No murmur heard. Pulmonary/Chest: Effort normal and breath sounds normal. No respiratory distress. She has no wheezes. She exhibits no tenderness.  Abdominal: Soft. Bowel sounds are normal. She exhibits no distension and no mass. There is no tenderness. There is no rebound and no guarding.  Musculoskeletal: Normal range of motion. She exhibits no edema or tenderness.  Lymphadenopathy:    She has no cervical adenopathy.  Neurological: She is alert and oriented to person, place, and time. No cranial nerve deficit. She exhibits normal muscle tone.  Skin: Skin is warm and dry. No rash noted. No erythema. No pallor.  Nursing note and vitals reviewed.   ED Course  Procedures (including critical care time)  DIAGNOSTIC STUDIES: Oxygen Saturation is 96% on RA, normal by my interpretation.    COORDINATION OF CARE: 12:25 AM-Discussed treatment plan which includes CXR, CBC, Troponin, and EKG with pt at bedside and pt agreed to plan.   Labs Review Labs Reviewed  BASIC METABOLIC PANEL - Abnormal; Notable for the following:    CO2 33 (*)    Glucose, Bld 113 (*)    Creatinine, Ser 1.40 (*)    GFR calc non Af Amer 41 (*)    GFR calc Af Amer 47 (*)    All other components within normal limits  PROTIME-INR - Abnormal; Notable for the following:    Prothrombin Time 19.6 (*)    INR 1.64 (*)     All other components within normal limits  COMPREHENSIVE METABOLIC PANEL - Abnormal; Notable for the following:    Glucose, Bld 107 (*)    Creatinine, Ser 1.37 (*)    Albumin 3.4 (*)    ALT 41 (*)    GFR calc non Af Amer 42 (*)    GFR calc Af Amer 48 (*)    All other components within normal limits  URINALYSIS, ROUTINE W REFLEX MICROSCOPIC - Abnormal; Notable for the following:    Color, Urine AMBER (*)    Bilirubin Urine SMALL (*)    Ketones, ur 15 (*)    All other components within normal limits  CBC WITH DIFFERENTIAL/PLATELET  LIPASE, BLOOD  I-STAT TROPOININ, ED  I-STAT TROPOININ, ED  I-STAT CG4 LACTIC ACID, ED    Imaging Review Dg Chest 2 View  10/26/2014   CLINICAL DATA:  Nausea, vomiting for 2 days, substernal chest pain for 1 day. Headache. History of CHF, diabetes, COPD, atrial fibrillation.  EXAM: CHEST  2 VIEW  COMPARISON:  Chest radiograph April 01, 2014  FINDINGS: The cardiac silhouette is mildly enlarged, unchanged. Mediastinal silhouette is unremarkable. No pleural effusions or focal consolidations. Trachea projects midline and there is no pneumothorax. Large body habitus. Osseous structures are nonsuspicious. Surgical clips in the included right abdomen likely reflect cholecystectomy.  IMPRESSION: Stable cardiomegaly, no acute pulmonary process.   Electronically Signed   By: Elon Alas   On: 10/26/2014 00:44     EKG Interpretation   Date/Time:  Monday October 26 2014 02:57:46 EST Ventricular Rate:  76 PR Interval:    QRS Duration: 80 QT Interval:  323 QTC Calculation: 363 R Axis:   -33 Text Interpretation:  Atrial fibrillation Inferior infarct, age  indeterminate Probable anterior infarct, age indeterminate No significant  change since last tracing Confirmed by Glynn Octave [id number]) on  10/26/2014 3:07:31 AM      MDM   Final diagnoses:  Chest pain, unspecified chest pain type  Viral  URI with cough   patient presents emergency department with  multiple complaints. She has had a history of atypical chest pain presentations with negative workups. She does have significant risk factors and thus will obtain 2 sets of troponins for evaluation. First troponin is negative, EKG shows atrial fibrillation which is normal for her. Patient states she's been compliant with her Pradaxa medications. Her history today is not consistent with ACS.  Patient also complains of diffuse body aches abdominal pain. This is in the setting of upper respiratory symptoms as well. She did not get a flu shot this year and she does have sick contacts at home. She was given Zofran, Tylenol, morphine for pain relief.   Repeat troponin and EKG are unchanged. At this time patient's vital signs have remained stable throughout her ED stay. She will be advised to follow-up with her cardiologist within 3 days for continued management. Patient is safe for discharge.  I personally performed the services described in this documentation, which was scribed in my presence. The recorded information has been reviewed and is accurate.      Everlene Balls, MD 10/26/14 [phone]

## 2014-10-26 NOTE — ED Notes (Signed)
Pt. Left with all belongings 

## 2015-02-03 ENCOUNTER — Other Ambulatory Visit: Payer: Self-pay | Admitting: Gastroenterology

## 2015-03-02 ENCOUNTER — Encounter (HOSPITAL_COMMUNITY): Payer: Self-pay | Admitting: *Deleted

## 2015-03-04 NOTE — H&P (Signed)
  [name] HPI: At this time the patient denies any problems with nausea, vomiting, fevers, chills, abdominal pain, diarrhea, constipation, hematochezia, melena, GERD, or dysphagia. The patient denies any known family history of colon cancers. No complaints of chest pain, SOB, MI, or sleep apnea. On 01/21/2010 the patient underwent a screening colonoscopy with findings of an adenomna in the ascending colon and the rectum. She was on coumadin at that time, but now she takes Pradaxa. The procedure was performed at [facility]. and a hemoclip was placed on the ascending colon site for prophylaxis.  Past Medical History  Diagnosis Date  . HTN (hypertension)   . Chronic diastolic CHF (congestive heart failure)   . DM (diabetes mellitus)   . Hyperlipidemia   . Allergic rhinitis   . Sleep apnea     compliant with CPAP  . COPD (chronic obstructive pulmonary disease)   . GERD (gastroesophageal reflux disease)   . Headache(784.0)   . Arthritis   . Depression   . Obesity   . DVT (deep vein thrombosis) in pregnancy   . Dysrhythmia     atrial fibrilation  . Shortness of breath     Past Surgical History  Procedure Laterality Date  . Abdominal hysterectomy    . Diagnostic laparoscopy    . Knee surgery    . Cholecystectomy    . Ingrown hallux Left     Family History  Problem Relation Age of Onset  . CAD    . Cancer Mother   . Hypertension Mother   . Cancer Father   . Hypertension Sister   . Hypertension Brother     Social History:  reports that she has never smoked. She has never used smokeless tobacco. She reports that she does not drink alcohol or use illicit drugs.  Allergies:  Allergies  Allergen Reactions  . Nsaids Other (See Comments)    On warfarin  . Sulfa Antibiotics Itching    Medications: Scheduled: Continuous:  No results found for this or any previous visit (from the past 24 hour(s)).   No results found.  ROS:  As stated above in the HPI otherwise  negative.  There were no vitals taken for this visit.    PE: Gen: NAD, Alert and Oriented HEENT:  [place]/AT, EOMI Neck: Supple, no LAD Lungs: CTA Bilaterally CV: RRR without M/G/R ABM: Soft, NTND, +BS Ext: No C/C/E  Assessment/Plan: 1) Personal history of polyps - colonoscopy.  Daequan Kozma D 03/04/2015, 12:47 PM

## 2015-03-05 ENCOUNTER — Ambulatory Visit (HOSPITAL_COMMUNITY)
Admission: RE | Admit: 2015-03-05 | Discharge: 2015-03-05 | Disposition: A | Discharge status: Home / Self Care | Payer: Medicare HMO | Source: Ambulatory Visit | Attending: Gastroenterology | Admitting: Gastroenterology

## 2015-03-05 ENCOUNTER — Ambulatory Visit (HOSPITAL_COMMUNITY): Payer: Medicare HMO | Admitting: Anesthesiology

## 2015-03-05 ENCOUNTER — Encounter (HOSPITAL_COMMUNITY): Payer: Self-pay

## 2015-03-05 ENCOUNTER — Encounter (HOSPITAL_COMMUNITY)
Admission: RE | Disposition: A | Discharge status: Home / Self Care | Payer: Self-pay | Source: Ambulatory Visit | Attending: Gastroenterology

## 2015-03-05 DIAGNOSIS — Z86010 Personal history of colonic polyps: Secondary | ICD-10-CM | POA: Insufficient documentation

## 2015-03-05 DIAGNOSIS — D120 Benign neoplasm of cecum: Secondary | ICD-10-CM | POA: Insufficient documentation

## 2015-03-05 DIAGNOSIS — Z6841 Body Mass Index (BMI) 40.0 and over, adult: Secondary | ICD-10-CM | POA: Diagnosis not present

## 2015-03-05 DIAGNOSIS — Z9049 Acquired absence of other specified parts of digestive tract: Secondary | ICD-10-CM | POA: Insufficient documentation

## 2015-03-05 DIAGNOSIS — I2510 Atherosclerotic heart disease of native coronary artery without angina pectoris: Secondary | ICD-10-CM | POA: Diagnosis not present

## 2015-03-05 DIAGNOSIS — Z79899 Other long term (current) drug therapy: Secondary | ICD-10-CM | POA: Diagnosis not present

## 2015-03-05 DIAGNOSIS — E119 Type 2 diabetes mellitus without complications: Secondary | ICD-10-CM | POA: Diagnosis not present

## 2015-03-05 DIAGNOSIS — M1990 Unspecified osteoarthritis, unspecified site: Secondary | ICD-10-CM | POA: Insufficient documentation

## 2015-03-05 DIAGNOSIS — I10 Essential (primary) hypertension: Secondary | ICD-10-CM | POA: Insufficient documentation

## 2015-03-05 DIAGNOSIS — J449 Chronic obstructive pulmonary disease, unspecified: Secondary | ICD-10-CM | POA: Insufficient documentation

## 2015-03-05 DIAGNOSIS — K219 Gastro-esophageal reflux disease without esophagitis: Secondary | ICD-10-CM | POA: Diagnosis not present

## 2015-03-05 DIAGNOSIS — Z9989 Dependence on other enabling machines and devices: Secondary | ICD-10-CM | POA: Diagnosis not present

## 2015-03-05 DIAGNOSIS — I5032 Chronic diastolic (congestive) heart failure: Secondary | ICD-10-CM | POA: Insufficient documentation

## 2015-03-05 DIAGNOSIS — G4730 Sleep apnea, unspecified: Secondary | ICD-10-CM | POA: Diagnosis not present

## 2015-03-05 DIAGNOSIS — D759 Disease of blood and blood-forming organs, unspecified: Secondary | ICD-10-CM | POA: Diagnosis not present

## 2015-03-05 DIAGNOSIS — I4891 Unspecified atrial fibrillation: Secondary | ICD-10-CM | POA: Diagnosis not present

## 2015-03-05 DIAGNOSIS — Z86718 Personal history of other venous thrombosis and embolism: Secondary | ICD-10-CM | POA: Diagnosis not present

## 2015-03-05 HISTORY — PX: COLONOSCOPY WITH PROPOFOL: SHX5780

## 2015-03-05 SURGERY — COLONOSCOPY WITH PROPOFOL
Anesthesia: Monitor Anesthesia Care

## 2015-03-05 MED ORDER — PROPOFOL INFUSION 10 MG/ML OPTIME
INTRAVENOUS | Status: DC | PRN
  Administered 2015-03-05: 300 ug/kg/min via INTRAVENOUS

## 2015-03-05 MED ORDER — PROPOFOL 10 MG/ML IV BOLUS
INTRAVENOUS | Status: AC
  Filled 2015-03-05: qty 20

## 2015-03-05 MED ORDER — LACTATED RINGERS IV SOLN
INTRAVENOUS | Status: DC
  Administered 2015-03-05: 1000 mL via INTRAVENOUS

## 2015-03-05 MED ORDER — FENTANYL CITRATE (PF) 100 MCG/2ML IJ SOLN: 25.0000 ug | INTRAMUSCULAR | Status: DC | PRN

## 2015-03-05 MED ORDER — MIDAZOLAM HCL 5 MG/ML IJ SOLN: 0.5000 mg | Freq: Once | INTRAMUSCULAR | Status: DC | PRN

## 2015-03-05 MED ORDER — MEPERIDINE HCL 100 MG/ML IJ SOLN: 6.2500 mg | INTRAMUSCULAR | Status: DC | PRN

## 2015-03-05 MED ORDER — PROMETHAZINE HCL 25 MG/ML IJ SOLN: 6.2500 mg | INTRAMUSCULAR | Status: DC | PRN

## 2015-03-05 MED ORDER — INSULIN ASPART 100 UNIT/ML ~~LOC~~ SOLN
2.0000 [IU] | Freq: Once | SUBCUTANEOUS | Status: AC
  Administered 2015-03-05: 2 [IU] via SUBCUTANEOUS
  Filled 2015-03-05: qty 0.02

## 2015-03-05 MED ORDER — SODIUM CHLORIDE 0.9 % IV SOLN: INTRAVENOUS | Status: DC

## 2015-03-05 MED ORDER — LIDOCAINE HCL (CARDIAC) 20 MG/ML IV SOLN
INTRAVENOUS | Status: AC
  Filled 2015-03-05: qty 5

## 2015-03-05 MED ORDER — LIDOCAINE HCL (CARDIAC) 20 MG/ML IV SOLN
INTRAVENOUS | Status: DC | PRN
  Administered 2015-03-05: 100 mg via INTRAVENOUS

## 2015-03-05 MED ORDER — PHENYLEPHRINE HCL 10 MG/ML IJ SOLN
INTRAMUSCULAR | Status: DC | PRN
  Administered 2015-03-05 (×2): 120 ug via INTRAVENOUS
  Administered 2015-03-05: 160 ug via INTRAVENOUS

## 2015-03-05 SURGICAL SUPPLY — 21 items

## 2015-03-05 NOTE — Op Note (Signed)
[name] [place], [address]   COLONOSCOPY PROCEDURE REPORT  PATIENT: Leslie, Gallagher  MR#: [id number] BIRTHDATE: [birth year] , 53  yrs. old GENDER: female ENDOSCOPIST: Carol Ada, MD REFERRED BY: PROCEDURE DATE:  03-14-2015 PROCEDURE:   Colonoscopy with snare polypectomy ASA CLASS:   Class III INDICATIONS: Personal history of polyps MEDICATIONS: Monitored anesthesia care  DESCRIPTION OF PROCEDURE:   After the risks and benefits and of the procedure were explained, informed consent was obtained.  revealed no abnormalities of the rectum.    The Pentax Ped Colon [id number] endoscope was introduced through the anus and advanced to the cecum, which was identified by both the appendix and ileocecal valve .  The quality of the prep was excellent. .  The instrument was then slowly withdrawn as the colon was fully examined. Estimated blood loss is zero unless otherwise noted in this procedure report.    FINDINGS: Two 3 mm sessile cecal polyps were removed with a cold snare.  No other abnormalities identified.            The scope was then withdrawn from the patient and the procedure completed.  WITHDRAWAL TIME: 12 minutes  COMPLICATIONS: There were no immediate complications. ENDOSCOPIC IMPRESSION: 1) Cecal polyps.  RECOMMENDATIONS: 1) Await biopsy results. 2) Repeat the colonoscopy in 5 years. 3) Restart Pradaxa tomorrow.  REPEAT EXAM:  cc:  _______________________________ eSignedCarol Ada, MD 2015/03/14 2:05 PM   CPT CODES: ICD CODES:  The ICD and CPT codes recommended by this software are interpretations from the data that the clinical staff has captured with the software.  The verification of the translation of this report to the ICD and CPT codes and modifiers is the sole responsibility of the health care institution and practicing physician where this report was generated.  [employer] will not be  held responsible for the validity of the ICD and CPT codes included on this report.  AMA assumes no liability for data contained or not contained herein. CPT is a Designer, television/film set of the [employer].

## 2015-03-05 NOTE — Anesthesia Postprocedure Evaluation (Signed)
  Anesthesia Post-op Note  Patient: [name]  Procedure(s) Performed: Procedure(s): COLONOSCOPY WITH PROPOFOL (N/A)  Patient Location: Endoscopy Unit  Anesthesia Type:MAC  Level of Consciousness: awake, alert , oriented and patient cooperative  Airway and Oxygen Therapy: Patient Spontanous Breathing  Post-op Pain: none  Post-op Assessment: Post-op Vital signs reviewed, Patient's Cardiovascular Status Stable, Respiratory Function Stable, Patent Airway, No signs of Nausea or vomiting, Adequate PO intake and Pain level controlled, brief upper abd/lower chest pain resolved..the patient attributes to hunger, VSS, EKG afib, unchanged at 96.              Post-op Vital Signs: Reviewed and stable  Last Vitals:  Filed Vitals:   03/05/15 1249  BP: 126/83  Pulse: 115  Temp: 36.7 C  Resp: 16    Complications: No apparent anesthesia complications. Glucose remains in 46's- daughter and patient prefer going home and taking usual insulin and hypoglycemics when eat

## 2015-03-05 NOTE — Discharge Instructions (Signed)
Colonoscopy, Care After °Refer to this sheet in the next few weeks. These instructions provide you with information on caring for yourself after your procedure. Your health care provider may also give you more specific instructions. Your treatment has been planned according to current medical practices, but problems sometimes occur. Call your health care provider if you have any problems or questions after your procedure. °WHAT TO EXPECT AFTER THE PROCEDURE  °After your procedure, it is typical to have the following: °· A small amount of blood in your stool. °· Moderate amounts of gas and mild abdominal cramping or bloating. °HOME CARE INSTRUCTIONS °· Do not drive, operate machinery, or sign important documents for 24 hours. °· You may shower and resume your regular physical activities, but move at a slower pace for the first 24 hours. °· Take frequent rest periods for the first 24 hours. °· Walk around or put a warm pack on your abdomen to help reduce abdominal cramping and bloating. °· Drink enough fluids to keep your urine clear or pale yellow. °· You may resume your normal diet as instructed by your health care provider. Avoid heavy or fried foods that are hard to digest. °· Avoid drinking alcohol for 24 hours or as instructed by your health care provider. °· Only take over-the-counter or prescription medicines as directed by your health care provider. °· If a tissue sample (biopsy) was taken during your procedure: °¨ Do not take aspirin or blood thinners for 7 days, or as instructed by your health care provider. °¨ Do not drink alcohol for 7 days, or as instructed by your health care provider. °¨ Eat soft foods for the first 24 hours. °SEEK MEDICAL CARE IF: °You have persistent spotting of blood in your stool 2-3 days after the procedure. °SEEK IMMEDIATE MEDICAL CARE IF: °· You have more than a small spotting of blood in your stool. °· You pass large blood clots in your stool. °· Your abdomen is swollen  (distended). °· You have nausea or vomiting. °· You have a fever. °· You have increasing abdominal pain that is not relieved with medicine. °Document Released: 03/21/2004 Document Revised: 05/28/2013 Document Reviewed: 04/14/2013 °Exit[employer]® Patient Information ©2015 [employer]. This information is not intended to replace advice given to you by your health care provider. Make sure you discuss any questions you have with your health care provider. ° °

## 2015-03-05 NOTE — Anesthesia Preprocedure Evaluation (Addendum)
Anesthesia Evaluation  Patient identified by MRN, date of birth, ID band Patient awake    Reviewed: Allergy & Precautions, NPO status , Patient's Chart, lab work & pertinent test results, reviewed documented beta blocker date and time   History of Anesthesia Complications Negative for: history of anesthetic complications  Airway Mallampati: II  TM Distance: >3 FB Neck ROM: Full    Dental  (+) Poor Dentition, Chipped, Missing, Dental Advisory Given   Pulmonary sleep apnea and Continuous Positive Airway Pressure Ventilation , COPD breath sounds clear to auscultation        Cardiovascular hypertension, Pt. on medications and Pt. on home beta blockers DVT + dysrhythmias Atrial Fibrillation Rhythm:Irregular Rate:Normal  '15 ECHO: EF 55-60%, mild MR Dr. Terrence Dupont: cardiac cath which showed mild coronary artery disease.   Neuro/Psych  Headaches, Depression    GI/Hepatic Neg liver ROS, GERD-  Medicated and Controlled,  Endo/Other  diabetes, Insulin Dependent, Oral Hypoglycemic AgentsMorbid obesity  Renal/GU negative Renal ROS     Musculoskeletal   Abdominal (+) + obese,   Peds  Hematology  (+) Blood dyscrasia (pradaxa-held for 3 days), ,   Anesthesia Other Findings   Reproductive/Obstetrics                         Anesthesia Physical Anesthesia Plan  ASA: III  Anesthesia Plan: MAC   Post-op Pain Management:    Induction: Intravenous  Airway Management Planned: Natural Airway and Simple Face Mask  Additional Equipment:   Intra-op Plan:   Post-operative Plan:   Informed Consent: I have reviewed the patients History and Physical, chart, labs and discussed the procedure including the risks, benefits and alternatives for the proposed anesthesia with the patient or authorized representative who has indicated his/her understanding and acceptance.   Dental advisory given  Plan Discussed with: CRNA  and Surgeon  Anesthesia Plan Comments: (Plan routine monitors, MAC)        Anesthesia Quick Evaluation

## 2015-03-05 NOTE — Transfer of Care (Signed)
Immediate Anesthesia Transfer of Care Note  Patient: [name]  Procedure(s) Performed: Procedure(s): COLONOSCOPY WITH PROPOFOL (N/A)  Patient Location: PACU  Anesthesia Type:MAC  Level of Consciousness: awake, alert , oriented and patient cooperative  Airway & Oxygen Therapy: Patient Spontanous Breathing and Patient connected to face mask  Post-op Assessment: Report given to RN, Post -op Vital signs reviewed and stable and Patient moving all extremities X 4  Post vital signs: stable  Last Vitals:  Filed Vitals:   03/05/15 1249  BP: 126/83  Pulse: 115  Temp: 36.7 C  Resp: 16    Complications: No apparent anesthesia complications

## 2015-03-08 ENCOUNTER — Encounter (HOSPITAL_COMMUNITY): Payer: Self-pay | Admitting: Gastroenterology

## 2015-03-08 LAB — GLUCOSE, CAPILLARY
Glucose-Capillary: 388 mg/dL — ABNORMAL HIGH (ref 65–99)
Glucose-Capillary: 351 mg/dL — ABNORMAL HIGH (ref 65–99)
Glucose-Capillary: 367 mg/dL — ABNORMAL HIGH (ref 65–99)

## 2015-03-19 ENCOUNTER — Encounter (HOSPITAL_COMMUNITY): Payer: Self-pay | Admitting: Emergency Medicine

## 2015-03-19 ENCOUNTER — Emergency Department (HOSPITAL_COMMUNITY): Payer: Medicare HMO

## 2015-03-19 ENCOUNTER — Inpatient Hospital Stay (HOSPITAL_COMMUNITY)
Admission: EM | Admit: 2015-03-19 | Discharge: 2015-03-21 | DRG: 313 | Disposition: A | Discharge status: Home / Self Care | Payer: Medicare HMO | Attending: Cardiology | Admitting: Cardiology

## 2015-03-19 ENCOUNTER — Observation Stay (HOSPITAL_COMMUNITY): Payer: Medicare HMO

## 2015-03-19 DIAGNOSIS — Z79899 Other long term (current) drug therapy: Secondary | ICD-10-CM

## 2015-03-19 DIAGNOSIS — Z8249 Family history of ischemic heart disease and other diseases of the circulatory system: Secondary | ICD-10-CM | POA: Diagnosis not present

## 2015-03-19 DIAGNOSIS — K219 Gastro-esophageal reflux disease without esophagitis: Secondary | ICD-10-CM | POA: Diagnosis present

## 2015-03-19 DIAGNOSIS — Z86718 Personal history of other venous thrombosis and embolism: Secondary | ICD-10-CM | POA: Diagnosis not present

## 2015-03-19 DIAGNOSIS — E785 Hyperlipidemia, unspecified: Secondary | ICD-10-CM | POA: Diagnosis present

## 2015-03-19 DIAGNOSIS — R0789 Other chest pain: Principal | ICD-10-CM | POA: Diagnosis present

## 2015-03-19 DIAGNOSIS — R079 Chest pain, unspecified: Secondary | ICD-10-CM | POA: Diagnosis present

## 2015-03-19 DIAGNOSIS — I482 Chronic atrial fibrillation: Secondary | ICD-10-CM | POA: Diagnosis present

## 2015-03-19 DIAGNOSIS — E662 Morbid (severe) obesity with alveolar hypoventilation: Secondary | ICD-10-CM | POA: Diagnosis present

## 2015-03-19 DIAGNOSIS — Z79891 Long term (current) use of opiate analgesic: Secondary | ICD-10-CM | POA: Diagnosis not present

## 2015-03-19 DIAGNOSIS — J449 Chronic obstructive pulmonary disease, unspecified: Secondary | ICD-10-CM | POA: Diagnosis present

## 2015-03-19 DIAGNOSIS — I10 Essential (primary) hypertension: Secondary | ICD-10-CM | POA: Diagnosis present

## 2015-03-19 DIAGNOSIS — J309 Allergic rhinitis, unspecified: Secondary | ICD-10-CM | POA: Diagnosis present

## 2015-03-19 DIAGNOSIS — Z6841 Body Mass Index (BMI) 40.0 and over, adult: Secondary | ICD-10-CM

## 2015-03-19 DIAGNOSIS — Z794 Long term (current) use of insulin: Secondary | ICD-10-CM | POA: Diagnosis not present

## 2015-03-19 DIAGNOSIS — F329 Major depressive disorder, single episode, unspecified: Secondary | ICD-10-CM | POA: Diagnosis present

## 2015-03-19 DIAGNOSIS — I5042 Chronic combined systolic (congestive) and diastolic (congestive) heart failure: Secondary | ICD-10-CM | POA: Diagnosis present

## 2015-03-19 DIAGNOSIS — E1122 Type 2 diabetes mellitus with diabetic chronic kidney disease: Secondary | ICD-10-CM | POA: Diagnosis present

## 2015-03-19 DIAGNOSIS — Z886 Allergy status to analgesic agent status: Secondary | ICD-10-CM

## 2015-03-19 DIAGNOSIS — I252 Old myocardial infarction: Secondary | ICD-10-CM

## 2015-03-19 DIAGNOSIS — L299 Pruritus, unspecified: Secondary | ICD-10-CM | POA: Diagnosis present

## 2015-03-19 DIAGNOSIS — Z881 Allergy status to other antibiotic agents status: Secondary | ICD-10-CM | POA: Diagnosis not present

## 2015-03-19 LAB — COMPREHENSIVE METABOLIC PANEL
ALT: 11 U/L — ABNORMAL LOW (ref 14–54)
ALT: 11 U/L — ABNORMAL LOW (ref 14–54)
AST: 14 U/L — ABNORMAL LOW (ref 15–41)
AST: 18 U/L (ref 15–41)
Albumin: 3 g/dL — ABNORMAL LOW (ref 3.5–5.0)
Albumin: 3.1 g/dL — ABNORMAL LOW (ref 3.5–5.0)
Alkaline Phosphatase: 86 U/L (ref 38–126)
Alkaline Phosphatase: 80 U/L (ref 38–126)
Anion gap: 9 (ref 5–15)
Anion gap: 7 (ref 5–15)
BUN: 11 mg/dL (ref 6–20)
BUN: 8 mg/dL (ref 6–20)
CO2: 24 mmol/L (ref 22–32)
CO2: 27 mmol/L (ref 22–32)
Calcium: 8.8 mg/dL — ABNORMAL LOW (ref 8.9–10.3)
Calcium: 8.9 mg/dL (ref 8.9–10.3)
Chloride: 105 mmol/L (ref 101–111)
Chloride: 104 mmol/L (ref 101–111)
Creatinine, Ser: 1.51 mg/dL — ABNORMAL HIGH (ref 0.44–1.00)
Creatinine, Ser: 1.28 mg/dL — ABNORMAL HIGH (ref 0.44–1.00)
GFR calc Af Amer: 43 mL/min — ABNORMAL LOW (ref >60)
GFR calc Af Amer: 52 mL/min — ABNORMAL LOW (ref >60)
GFR calc non Af Amer: 37 mL/min — ABNORMAL LOW (ref >60)
GFR calc non Af Amer: 45 mL/min — ABNORMAL LOW (ref >60)
Glucose, Bld: 251 mg/dL — ABNORMAL HIGH (ref 65–99)
Glucose, Bld: 205 mg/dL — ABNORMAL HIGH (ref 65–99)
Potassium: 4.5 mmol/L (ref 3.5–5.1)
Potassium: 4.3 mmol/L (ref 3.5–5.1)
Sodium: 138 mmol/L (ref 135–145)
Sodium: 138 mmol/L (ref 135–145)
Total Bilirubin: 0.4 mg/dL (ref 0.3–1.2)
Total Bilirubin: 0.8 mg/dL (ref 0.3–1.2)
Total Protein: 7 g/dL (ref 6.5–8.1)
Total Protein: 6.8 g/dL (ref 6.5–8.1)

## 2015-03-19 LAB — BASIC METABOLIC PANEL
Anion gap: 8 (ref 5–15)
BUN: 11 mg/dL (ref 6–20)
CO2: 26 mmol/L (ref 22–32)
Calcium: 8.8 mg/dL — ABNORMAL LOW (ref 8.9–10.3)
Chloride: 104 mmol/L (ref 101–111)
Creatinine, Ser: 1.42 mg/dL — ABNORMAL HIGH (ref 0.44–1.00)
GFR calc Af Amer: 46 mL/min — ABNORMAL LOW (ref >60)
GFR calc non Af Amer: 40 mL/min — ABNORMAL LOW (ref >60)
Glucose, Bld: 256 mg/dL — ABNORMAL HIGH (ref 65–99)
Potassium: 4.6 mmol/L (ref 3.5–5.1)
Sodium: 138 mmol/L (ref 135–145)

## 2015-03-19 LAB — CBC
HCT: 39.9 % (ref 36.0–46.0)
Hemoglobin: 13.4 g/dL (ref 12.0–15.0)
MCH: 29.7 pg (ref 26.0–34.0)
MCHC: 33.6 g/dL (ref 30.0–36.0)
MCV: 88.5 fL (ref 78.0–100.0)
Platelets: 212 10*3/uL (ref 150–400)
RBC: 4.51 MIL/uL (ref 3.87–5.11)
RDW: 12.7 % (ref 11.5–15.5)
WBC: 6.7 10*3/uL (ref 4.0–10.5)

## 2015-03-19 LAB — CBC WITH DIFFERENTIAL/PLATELET
Basophils Absolute: 0 10*3/uL (ref 0.0–0.1)
Basophils Relative: 0 % (ref 0–1)
Eosinophils Absolute: 0.2 10*3/uL (ref 0.0–0.7)
Eosinophils Relative: 3 % (ref 0–5)
HCT: 40.2 % (ref 36.0–46.0)
Hemoglobin: 13.4 g/dL (ref 12.0–15.0)
Lymphocytes Relative: 34 % (ref 12–46)
Lymphs Abs: 2 10*3/uL (ref 0.7–4.0)
MCH: 29.4 pg (ref 26.0–34.0)
MCHC: 33.3 g/dL (ref 30.0–36.0)
MCV: 88.2 fL (ref 78.0–100.0)
Monocytes Absolute: 0.6 10*3/uL (ref 0.1–1.0)
Monocytes Relative: 10 % (ref 3–12)
Neutro Abs: 3.1 10*3/uL (ref 1.7–7.7)
Neutrophils Relative %: 53 % (ref 43–77)
Platelets: 205 10*3/uL (ref 150–400)
RBC: 4.56 MIL/uL (ref 3.87–5.11)
RDW: 12.7 % (ref 11.5–15.5)
WBC: 5.9 10*3/uL (ref 4.0–10.5)

## 2015-03-19 LAB — LIPASE, BLOOD: Lipase: 17 U/L — ABNORMAL LOW (ref 22–51)

## 2015-03-19 LAB — I-STAT TROPONIN, ED: Troponin i, poc: 0 ng/mL (ref 0.00–0.08)

## 2015-03-19 LAB — GLUCOSE, CAPILLARY
Glucose-Capillary: 194 mg/dL — ABNORMAL HIGH (ref 65–99)
Glucose-Capillary: 198 mg/dL — ABNORMAL HIGH (ref 65–99)

## 2015-03-19 LAB — MRSA PCR SCREENING: MRSA by PCR: NEGATIVE

## 2015-03-19 LAB — TROPONIN I
Troponin I: <0.03 ng/mL (ref <0.031)
Troponin I: <0.03 ng/mL (ref <0.031)

## 2015-03-19 MED ORDER — ALBUTEROL SULFATE (2.5 MG/3ML) 0.083% IN NEBU: 2.5000 mg | INHALATION_SOLUTION | RESPIRATORY_TRACT | Status: DC | PRN

## 2015-03-19 MED ORDER — FUROSEMIDE 40 MG PO TABS
40.0000 mg | ORAL_TABLET | Freq: Every day | ORAL | Status: DC
  Administered 2015-03-20 – 2015-03-21 (×2): 40 mg via ORAL
  Filled 2015-03-19 (×2): qty 1

## 2015-03-19 MED ORDER — NITROGLYCERIN 0.4 MG SL SUBL
0.4000 mg | SUBLINGUAL_TABLET | SUBLINGUAL | Status: AC | PRN
  Administered 2015-03-19 (×3): 0.4 mg via SUBLINGUAL
  Filled 2015-03-19 (×2): qty 1

## 2015-03-19 MED ORDER — INSULIN ASPART 100 UNIT/ML ~~LOC~~ SOLN
0.0000 [IU] | Freq: Three times a day (TID) | SUBCUTANEOUS | Status: DC
  Administered 2015-03-19: 2 [IU] via SUBCUTANEOUS
  Administered 2015-03-20: 3 [IU] via SUBCUTANEOUS
  Administered 2015-03-20: 2 [IU] via SUBCUTANEOUS
  Administered 2015-03-20 – 2015-03-21 (×3): 3 [IU] via SUBCUTANEOUS

## 2015-03-19 MED ORDER — NITROGLYCERIN 0.4 MG SL SUBL
0.4000 mg | SUBLINGUAL_TABLET | SUBLINGUAL | Status: DC | PRN
  Administered 2015-03-21 (×3): 0.4 mg via SUBLINGUAL
  Filled 2015-03-19: qty 1

## 2015-03-19 MED ORDER — CLORAZEPATE DIPOTASSIUM 7.5 MG PO TABS: 7.5000 mg | ORAL_TABLET | Freq: Every day | ORAL | Status: DC

## 2015-03-19 MED ORDER — SPIRONOLACTONE 25 MG PO TABS
25.0000 mg | ORAL_TABLET | Freq: Every day | ORAL | Status: DC
  Administered 2015-03-19 – 2015-03-21 (×3): 25 mg via ORAL
  Filled 2015-03-19 (×4): qty 1

## 2015-03-19 MED ORDER — ACETAMINOPHEN 325 MG PO TABS
650.0000 mg | ORAL_TABLET | ORAL | Status: DC | PRN
  Administered 2015-03-19 – 2015-03-21 (×5): 650 mg via ORAL
  Filled 2015-03-19 (×5): qty 2

## 2015-03-19 MED ORDER — ASPIRIN 81 MG PO CHEW: 324.0000 mg | CHEWABLE_TABLET | ORAL | Status: AC

## 2015-03-19 MED ORDER — ROSUVASTATIN CALCIUM 10 MG PO TABS
10.0000 mg | ORAL_TABLET | Freq: Every day | ORAL | Status: DC
  Administered 2015-03-20 – 2015-03-21 (×2): 10 mg via ORAL
  Filled 2015-03-19 (×3): qty 1

## 2015-03-19 MED ORDER — LOSARTAN POTASSIUM 50 MG PO TABS
50.0000 mg | ORAL_TABLET | Freq: Every day | ORAL | Status: DC
  Administered 2015-03-20 – 2015-03-21 (×2): 50 mg via ORAL
  Filled 2015-03-19 (×3): qty 1

## 2015-03-19 MED ORDER — ASPIRIN EC 81 MG PO TBEC
81.0000 mg | DELAYED_RELEASE_TABLET | Freq: Every day | ORAL | Status: DC
  Administered 2015-03-20 – 2015-03-21 (×2): 81 mg via ORAL
  Filled 2015-03-19 (×2): qty 1

## 2015-03-19 MED ORDER — CARVEDILOL 12.5 MG PO TABS
12.5000 mg | ORAL_TABLET | Freq: Two times a day (BID) | ORAL | Status: DC
  Administered 2015-03-19 – 2015-03-21 (×4): 12.5 mg via ORAL
  Filled 2015-03-19 (×7): qty 1

## 2015-03-19 MED ORDER — REGADENOSON 0.4 MG/5ML IV SOLN
0.4000 mg | Freq: Once | INTRAVENOUS | Status: AC
  Administered 2015-03-19: 0.4 mg via INTRAVENOUS
  Filled 2015-03-19: qty 5

## 2015-03-19 MED ORDER — ASPIRIN 300 MG RE SUPP
300.0000 mg | RECTAL | Status: AC
Start: 2015-03-19 — End: 2015-03-20

## 2015-03-19 MED ORDER — REGADENOSON 0.4 MG/5ML IV SOLN
INTRAVENOUS | Status: AC
  Administered 2015-03-19: 0.4 mg via INTRAVENOUS
  Filled 2015-03-19: qty 5

## 2015-03-19 MED ORDER — NITROGLYCERIN 2 % TD OINT
0.5000 [in_us] | TOPICAL_OINTMENT | Freq: Three times a day (TID) | TRANSDERMAL | Status: DC
  Administered 2015-03-19 – 2015-03-20 (×3): 0.5 [in_us] via TOPICAL
  Filled 2015-03-19: qty 30

## 2015-03-19 MED ORDER — POTASSIUM CHLORIDE CRYS ER 20 MEQ PO TBCR
20.0000 meq | EXTENDED_RELEASE_TABLET | Freq: Two times a day (BID) | ORAL | Status: DC
  Administered 2015-03-19 – 2015-03-21 (×4): 20 meq via ORAL
  Filled 2015-03-19 (×5): qty 1

## 2015-03-19 MED ORDER — ONDANSETRON HCL 4 MG/2ML IJ SOLN
4.0000 mg | Freq: Four times a day (QID) | INTRAMUSCULAR | Status: DC | PRN
Start: 2015-03-19 — End: 2015-03-21

## 2015-03-19 MED ORDER — ALBUTEROL SULFATE HFA 108 (90 BASE) MCG/ACT IN AERS: 2.0000 | INHALATION_SPRAY | RESPIRATORY_TRACT | Status: DC | PRN

## 2015-03-19 MED ORDER — DABIGATRAN ETEXILATE MESYLATE 75 MG PO CAPS
75.0000 mg | ORAL_CAPSULE | Freq: Two times a day (BID) | ORAL | Status: DC
  Administered 2015-03-19 – 2015-03-21 (×4): 75 mg via ORAL
  Filled 2015-03-19 (×5): qty 1

## 2015-03-19 MED ORDER — PANTOPRAZOLE SODIUM 40 MG PO TBEC
40.0000 mg | DELAYED_RELEASE_TABLET | Freq: Every day | ORAL | Status: DC
  Administered 2015-03-20 – 2015-03-21 (×2): 40 mg via ORAL
  Filled 2015-03-19 (×2): qty 1

## 2015-03-19 MED ORDER — CLORAZEPATE DIPOTASSIUM 3.75 MG PO TABS
7.5000 mg | ORAL_TABLET | Freq: Every day | ORAL | Status: DC
  Administered 2015-03-19 – 2015-03-21 (×3): 7.5 mg via ORAL
  Filled 2015-03-19 (×3): qty 2

## 2015-03-19 MED ORDER — NITROGLYCERIN 0.4 MG SL SUBL: 0.4000 mg | SUBLINGUAL_TABLET | SUBLINGUAL | Status: DC | PRN

## 2015-03-19 MED ORDER — GABAPENTIN 300 MG PO CAPS
300.0000 mg | ORAL_CAPSULE | Freq: Every day | ORAL | Status: DC
  Administered 2015-03-19 – 2015-03-20 (×2): 300 mg via ORAL
  Filled 2015-03-19 (×3): qty 1
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
## 2015-03-19 NOTE — ED Notes (Signed)
Attempted report 

## 2015-03-19 NOTE — ED Notes (Addendum)
Pt still experiencing pain and is scheduled to go to tele floor.  MD paged.

## 2015-03-19 NOTE — ED Notes (Signed)
Dr Terrence Dupont feels pt's pain is reproducible and not cardiac related.  However, floor feels since pt is a cardiac work-up, they cannot receive pt.  Dr Terrence Dupont feels narcotics will cause pt to retain co2, so only non-narcotic pain meds.  Pt is aware.

## 2015-03-19 NOTE — ED Notes (Signed)
Cardiology at bedside.

## 2015-03-19 NOTE — ED Notes (Addendum)
BP too low for nitro. See VS flowsheet  MD notified.

## 2015-03-19 NOTE — ED Notes (Signed)
PT to Nm
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
## 2015-03-20 ENCOUNTER — Observation Stay (HOSPITAL_COMMUNITY): Payer: Medicare HMO

## 2015-03-20 LAB — NM MYOCAR MULTI W/SPECT W/WALL MOTION / EF
LV dias vol: 108 mL
LV sys vol: 65 mL
RATE: 0.35
SDS: 0
SRS: 1
SSS: 1
TID: 0.88

## 2015-03-20 LAB — TROPONIN I: Troponin I: <0.03 ng/mL (ref <0.031)

## 2015-03-20 LAB — HEMOGLOBIN A1C
Hgb A1c MFr Bld: 13.5 % — ABNORMAL HIGH (ref 4.8–5.6)
Mean Plasma Glucose: 341 mg/dL

## 2015-03-20 LAB — GLUCOSE, CAPILLARY
Glucose-Capillary: 177 mg/dL — ABNORMAL HIGH (ref 65–99)
Glucose-Capillary: 234 mg/dL — ABNORMAL HIGH (ref 65–99)
Glucose-Capillary: 206 mg/dL — ABNORMAL HIGH (ref 65–99)
Glucose-Capillary: 215 mg/dL — ABNORMAL HIGH (ref 65–99)

## 2015-03-20 MED ORDER — ISOSORBIDE MONONITRATE ER 30 MG PO TB24
30.0000 mg | ORAL_TABLET | Freq: Every day | ORAL | Status: DC
  Administered 2015-03-20 – 2015-03-21 (×2): 30 mg via ORAL
  Filled 2015-03-20 (×2): qty 1

## 2015-03-20 MED ORDER — TECHNETIUM TC 99M SESTAMIBI - CARDIOLITE
30.0000 | Freq: Once | INTRAVENOUS | Status: AC | PRN
  Administered 2015-03-19: 30 via INTRAVENOUS

## 2015-03-20 MED ORDER — TECHNETIUM TC 99M TETROFOSMIN IV KIT
30.0000 | PACK | Freq: Once | INTRAVENOUS | Status: AC | PRN
  Administered 2015-03-20: 30 via INTRAVENOUS

## 2015-03-20 MED ORDER — DIPHENHYDRAMINE HCL 25 MG PO CAPS
25.0000 mg | ORAL_CAPSULE | Freq: Once | ORAL | Status: AC
  Administered 2015-03-20: 25 mg via ORAL
  Filled 2015-03-20: qty 1

## 2015-03-20 NOTE — Progress Notes (Signed)
Ref: Patricia Nettle, MD   Subjective:  C/O itching. Some weakness. Has not ambulated. Negative significant perfusion defect but EF 40 % from 50 % 14 months ago by echocardiogram.  Objective:  Vital Signs in the last 24 hours: Temp:  [97.4 F (36.3 C)-98 F (36.7 C)] 98 F (36.7 C) (07/30 0600) Pulse Rate:  [57-168] 105 (07/30 0900) Cardiac Rhythm:  [-] Atrial fibrillation (07/30 0900) Resp:  [13-25] 17 (07/30 0900) BP: (100-133)/(48-99) 122/67 mmHg (07/30 0900) SpO2:  [93 %-100 %] 100 % (07/30 0900)  Physical Exam: BP Readings from Last 1 Encounters:  03/20/15 122/67    Wt Readings from Last 1 Encounters:  03/19/15 139.254 kg (307 lb)    Weight change:   HEENT: [place]/AT, Eyes-Brown, PERL, EOMI, Conjunctiva-Pink, Sclera-Non-icteric Neck: No JVD, No bruit, Trachea midline. Lungs:  Clear, Bilateral. Cardiac:  Regular rhythm, normal S1 and S2, no S3.  Abdomen:  Soft, non-tender. Extremities:  Trace edema present. No cyanosis. No clubbing. CNS: AxOx3, Cranial nerves grossly intact, moves all 4 extremities. Right handed. Skin: Warm and dry.   Intake/Output from previous day: 07/29 0701 - 07/30 0700 In: 118 [P.O.:118] Out: 1350 [Urine:1350]    Lab Results: BMET    Component Value Date/Time   NA 138 03/19/2015 1648   NA 138 03/19/2015 0125   NA 138 03/19/2015 0125   K 4.3 03/19/2015 1648   K 4.6 03/19/2015 0125   K 4.5 03/19/2015 0125   CL 104 03/19/2015 1648   CL 104 03/19/2015 0125   CL 105 03/19/2015 0125   CO2 27 03/19/2015 1648   CO2 26 03/19/2015 0125   CO2 24 03/19/2015 0125   GLUCOSE 205* 03/19/2015 1648   GLUCOSE 256* 03/19/2015 0125   GLUCOSE 251* 03/19/2015 0125   BUN 8 03/19/2015 1648   BUN 11 03/19/2015 0125   BUN 11 03/19/2015 0125   CREATININE 1.28* 03/19/2015 1648   CREATININE 1.42* 03/19/2015 0125   CREATININE 1.51* 03/19/2015 0125   CALCIUM 8.9 03/19/2015 1648   CALCIUM 8.8* 03/19/2015 0125   CALCIUM 8.8* 03/19/2015 0125   GFRNONAA 45*  03/19/2015 1648   GFRNONAA 40* 03/19/2015 0125   GFRNONAA 37* 03/19/2015 0125   GFRAA 52* 03/19/2015 1648   GFRAA 46* 03/19/2015 0125   GFRAA 43* 03/19/2015 0125   CBC    Component Value Date/Time   WBC 5.9 03/19/2015 1648   RBC 4.56 03/19/2015 1648   HGB 13.4 03/19/2015 1648   HCT 40.2 03/19/2015 1648   PLT 205 03/19/2015 1648   MCV 88.2 03/19/2015 1648   MCH 29.4 03/19/2015 1648   MCHC 33.3 03/19/2015 1648   RDW 12.7 03/19/2015 1648   LYMPHSABS 2.0 03/19/2015 1648   MONOABS 0.6 03/19/2015 1648   EOSABS 0.2 03/19/2015 1648   BASOSABS 0.0 03/19/2015 1648   HEPATIC Function Panel  Recent Labs  10/26/14 0054 03/19/15 0125 03/19/15 1648  PROT 7.7 7.0 6.8   HEMOGLOBIN A1C No components found for: HGA1C,  MPG CARDIAC ENZYMES Lab Results  Component Value Date   CKTOTAL 118 02/27/2012   CKMB 1.1 02/27/2012   TROPONINI <0.03 03/20/2015   TROPONINI <0.03 03/19/2015   TROPONINI <0.03 03/19/2015   BNP  Recent Labs  04/01/14 1515  PROBNP 452.0*   TSH No results for input(s): TSH in the last 8760 hours. CHOLESTEROL No results for input(s): CHOL in the last 8760 hours.  Scheduled Meds: . aspirin  324 mg Oral NOW   Or  . aspirin  300 mg  Rectal NOW  . aspirin EC  81 mg Oral Daily  . carvedilol  12.5 mg Oral BID WC  . clorazepate  7.5 mg Oral Daily  . dabigatran  75 mg Oral BID  . furosemide  40 mg Oral Daily  . gabapentin  300 mg Oral QHS  . insulin aspart  0-9 Units Subcutaneous TID WC  . losartan  50 mg Oral Q breakfast  . nitroGLYCERIN  0.5 inch Topical 3 times per day  . pantoprazole  40 mg Oral Daily  . potassium chloride SA  20 mEq Oral BID  . rosuvastatin  10 mg Oral Q breakfast  . spironolactone  25 mg Oral Daily   Continuous Infusions:  PRN Meds:.acetaminophen, albuterol, nitroGLYCERIN, ondansetron (ZOFRAN) IV  Assessment/Plan: Atypical chest pain  MI ruled out Hypertension Diabetes mellitus, II Chronic atrial fibrillation with controlled  ventricular response Compensated congestive heart failure secondary to preserve LV systolic function COPD Obstructive sleep apnea/obesity hypoventilation syndrome Morbid obesity Chronic kidney disease History of DVT in the past Depression Degenerative joint disease  Benadryl for itching. Increase activity with PT evaluation.     LOS: 1 day    Dixie Dials  MD  03/20/2015, 11:21 AM

## 2015-03-21 MED ORDER — ISOSORBIDE MONONITRATE ER 30 MG PO TB24
30.0000 mg | ORAL_TABLET | Freq: Every day | ORAL | Status: DC
Start: 2015-03-21 — End: 2016-04-12

## 2015-03-21 MED ORDER — DIGOXIN 250 MCG PO TABS: 0.1250 mg | ORAL_TABLET | Freq: Every day | ORAL | Status: DC

## 2015-03-21 NOTE — Evaluation (Signed)
Physical Therapy Evaluation Patient Details Name: [name] MRN: [id number] DOB: 1956/04/11 Today's Date: 03/21/2015   History of Present Illness  59 year old female with past medical history significant for hypertension, diabetes mellitus, chronic atrial fibrillation, COPD, obstructive sleep apnea/obesity hypoventilation syndrome, chronic kidney disease, history of DVT in the past, depression, degenerative joint disease, history of congestive heart failure secondary to preserved LV systolic function, morbid obesity, came to the ER by EMS complaining of retrosternal chest pain , negative for MI  Clinical Impression  Pt moving well with decreased speed and cane use however reaching out for additional supports and recommend RW use for gait which pt currently has at home. Pt reports having HHPT previously which helped her and feel she would benefit again given recent falls and 2 recent hospital admissions. Will follow acutely to maximize gait, balance, and independence to decrease burden of  Care.   Follow Up Recommendations Home health PT    Equipment Recommendations  None recommended by PT    Recommendations for Other Services       Precautions / Restrictions Precautions Precautions: Fall      Mobility  Bed Mobility Overal bed mobility: Modified Independent                Transfers Overall transfer level: Modified independent                  Ambulation/Gait Ambulation/Gait assistance: Supervision Ambulation Distance (Feet): 200 Feet Assistive device: Straight cane Gait Pattern/deviations: Step-through pattern;Decreased stride length   Gait velocity interpretation: Below normal speed for age/gender General Gait Details: decreased speed and stride with fatigue halfway through and reaching out for rail with free hand. again encouraged RW use at all times. Pt does report a few falls in the last year  Stairs Stairs: Yes Stairs assistance: Modified  independent (Device/Increase time) Stair Management: One rail Right;Step to pattern;Forwards;With cane Number of Stairs: 3 General stair comments: good control with stairs with cane and rail   Wheelchair Mobility    Modified Rankin (Stroke Patients Only)       Balance                                             Pertinent Vitals/Pain Pain Assessment: No/denies pain  sats 100% on RA HR 104-109 with gait    Home Living Family/patient expects to be discharged to:: Private residence Living Arrangements: Children Available Help at Discharge: Family;Available PRN/intermittently Type of Home: Apartment Home Access: Stairs to enter Entrance Stairs-Rails: Left Entrance Stairs-Number of Steps: 3 Home Layout: One level Home Equipment: Walker - 2 wheels;Cane - single point Additional Comments: kids are there to help with cooking    Prior Function           Comments: uses cane but dgtr and PT previously recommended RW use and reiterated current recommendation for RW use as well     Hand Dominance        Extremity/Trunk Assessment   Upper Extremity Assessment: Overall WFL for tasks assessed           Lower Extremity Assessment: Overall WFL for tasks assessed      Cervical / Trunk Assessment: Normal  Communication   Communication: No difficulties  Cognition Arousal/Alertness: Awake/alert Behavior During Therapy: WFL for tasks assessed/performed Overall Cognitive Status: Within Functional Limits for tasks assessed  General Comments      Exercises        Assessment/Plan    PT Assessment Patient needs continued PT services  PT Diagnosis Difficulty walking   PT Problem List Decreased activity tolerance;Decreased balance;Decreased knowledge of use of DME  PT Treatment Interventions Gait training;DME instruction;Functional mobility training;Therapeutic exercise;Patient/family education   PT Goals (Current goals  can be found in the Care Plan section) Acute Rehab PT Goals Patient Stated Goal: return home PT Goal Formulation: With patient Time For Goal Achievement: 04/04/15 Potential to Achieve Goals: Good    Frequency Min 3X/week   Barriers to discharge Decreased caregiver support      Co-evaluation               End of Session   Activity Tolerance: Patient tolerated treatment well Patient left: in chair;with call bell/phone within reach;with nursing/sitter in room Nurse Communication: Mobility status         Time: [id number] PT Time Calculation (min) (ACUTE ONLY): 19 min   Charges:   PT Evaluation $Initial PT Evaluation Tier I: 1 Procedure     PT G CodesMelford Aase 03/21/2015, 11:44 AM Elwyn Reach, [place]

## 2015-03-21 NOTE — Progress Notes (Signed)
Patient is discharge to home in stable condition.

## 2015-03-21 NOTE — Discharge Summary (Signed)
Physician Discharge Summary  Patient ID: [name] MRN: [id number] DOB/AGE: [birth year] 59 y.o.  Admit date: 03/19/2015 Discharge date: 03/21/2015  Admission Diagnoses: Atypical chest pain  Hypertension Diabetes mellitus, II Chronic atrial fibrillation with controlled ventricular response Compensated congestive heart failure secondary to preserve LV systolic function COPD Obstructive sleep apnea/obesity hypoventilation syndrome Morbid obesity Chronic kidney disease History of DVT in the past Depression Degenerative joint disease  Discharge Diagnoses:  Principle Problem: * Chest pain *  MI ruled out Hypertension Diabetes mellitus, II Chronic atrial fibrillation with controlled ventricular response-ChadsVasc score of 3 Compensated congestive heart failure secondary to preserve LV systolic function COPD Obstructive sleep apnea/obesity hypoventilation syndrome Morbid obesity Chronic kidney disease History of DVT in the past Depression Degenerative joint disease  Discharged Condition: fair  Hospital Course: Patient is 59 year old female with past medical history significant for hypertension, diabetes mellitus, chronic atrial fibrillation, COPD, obstructive sleep apnea/obesity hypoventilation syndrome, chronic kidney disease, history of DVT in the past, depression, degenerative joint disease, history of congestive heart failure secondary to preserved LV systolic function, morbid obesity, came to the ER by EMS complaining of retrosternal chest pain described as dull aching pain radiating to left arm associated with shortness of breath after argument with her daughter. Troponin-I's were normal and Nuclear stress test was without significant reversible ischemia. She was started on Imdur and discharged home in stable condition with follow-up in one week.  Consults: cardiology  Significant Diagnostic Studies: labs: Normal CBC, BMET except sugar of 251 mg. and Creatinine of  1.51  EKG-Atrial fibrillation with low voltage and inferior infarct.  Nuclear stress test: Normal myocardial perfusion with EF 40 %.  Treatments: cardiac meds: carvedilol, Crestor, Losartan, Pradaxa and spironolactone  Discharge Exam: Blood pressure 95/59, pulse 53, temperature 98.6 F (37 C), temperature source Oral, resp. rate 24, height 5\' 4"  (1.626 m), weight 143.4 kg (316 lb 2.2 oz), SpO2 99 %. HEENT: [place]/AT, Eyes-Brown, PERL, EOMI, Conjunctiva-Pink, Sclera-Non-icteric Neck: No JVD, No bruit, Trachea midline. Lungs: Clear, Bilateral. Cardiac: Regular rhythm, normal S1 and S2, no S3.  Abdomen: Soft, non-tender. Obese. Extremities: Trace edema present. No cyanosis. No clubbing. CNS: AxOx3, Cranial nerves grossly intact, moves all 4 extremities. Right handed. Skin: Warm and dry.  Disposition: 01-Home or Self Care     Medication List    STOP taking these medications        clindamycin 150 MG capsule  Commonly known as:  CLEOCIN      TAKE these medications        carvedilol 12.5 MG tablet  Commonly known as:  COREG  Take 12.5 mg by mouth 2 (two) times daily with a meal.     clorazepate 7.5 MG tablet  Commonly known as:  TRANXENE  Take 7.5 mg by mouth daily.     dabigatran 75 MG Caps capsule  Commonly known as:  PRADAXA  Take 75 mg by mouth 2 (two) times daily.     digoxin 0.25 MG tablet  Commonly known as:  LANOXIN  Take 0.5 tablets (0.125 mg total) by mouth daily.     furosemide 40 MG tablet  Commonly known as:  LASIX  Take 40 mg by mouth daily.     gabapentin 300 MG capsule  Commonly known as:  NEURONTIN  Take 300 mg by mouth at bedtime.     insulin aspart 100 UNIT/ML injection  Commonly known as:  novoLOG  Inject 5-10 Units into the skin every 6 (six) hours. 200-300 takes 5 units, 301-400  takes 7 units, 401-500 takes 12 units. Sliding Scale     insulin detemir 100 UNIT/ML injection  Commonly known as:  LEVEMIR  Inject 5 Units into the skin 2  (two) times daily as needed (blood sugar over 200).     isosorbide mononitrate 30 MG 24 hr tablet  Commonly known as:  IMDUR  Take 1 tablet (30 mg total) by mouth daily.     losartan 50 MG tablet  Commonly known as:  COZAAR  Take 50 mg by mouth daily with breakfast.     nitroGLYCERIN 0.4 MG SL tablet  Commonly known as:  NITROSTAT  Place 1 tablet (0.4 mg total) under the tongue every 5 (five) minutes x 3 doses as needed for chest pain.     omeprazole 40 MG capsule  Commonly known as:  PRILOSEC  Take 40 mg by mouth daily.     ondansetron 4 MG disintegrating tablet  Commonly known as:  ZOFRAN ODT  Take 1 tablet (4 mg total) by mouth every 8 (eight) hours as needed for nausea or vomiting.     oxyCODONE-acetaminophen 5-325 MG per tablet  Commonly known as:  PERCOCET/ROXICET  Take 1 tablet by mouth every 6 (six) hours as needed for severe pain (pain).     PATANOL 0.1 % ophthalmic solution  Generic drug:  olopatadine  Place 1 drop into both eyes 2 (two) times daily.     potassium chloride SA 20 MEQ tablet  Commonly known as:  K-DUR,KLOR-CON  Take 20 mEq by mouth 2 (two) times daily.     PROAIR HFA 108 (90 BASE) MCG/ACT inhaler  Generic drug:  albuterol  Inhale 2 puffs into the lungs every 4 (four) hours as needed for wheezing (wheezing).     rosuvastatin 10 MG tablet  Commonly known as:  CRESTOR  Take 10 mg by mouth daily with breakfast.     sitaGLIPtin 50 MG tablet  Commonly known as:  JANUVIA  Take 50 mg by mouth daily with breakfast.     spironolactone 25 MG tablet  Commonly known as:  ALDACTONE  Take 25 mg by mouth daily.           Follow-up Information    Follow up with Patricia Nettle, MD. Schedule an appointment as soon as possible for a visit in 1 week.   Specialty:  Cardiology   Contact information:   [place] [address] [phone]       Signed: Birdie Riddle 03/21/2015, 11:01 AM

## 2015-03-22 NOTE — Care Management Note (Addendum)
Case Management Note  Patient Details  Name: [name] MRN: [id number] Date of Birth: 09/04/1955  Subjective/Objective:    Noted PT recommendation for home health PT for pt who was discharged.  TC to pt, no answer, voicemail message left requesting return call.                Dinita Migliaccio, [place] [facility], RN 03/22/2015, 1:42 PM

## 2015-06-14 ENCOUNTER — Other Ambulatory Visit: Payer: Self-pay | Admitting: Cardiology

## 2015-06-14 DIAGNOSIS — Z1231 Encounter for screening mammogram for malignant neoplasm of breast: Secondary | ICD-10-CM

## 2015-06-23 ENCOUNTER — Ambulatory Visit: Payer: Medicare Other

## 2015-07-20 ENCOUNTER — Other Ambulatory Visit: Payer: Self-pay | Admitting: Obstetrics and Gynecology

## 2015-07-20 DIAGNOSIS — R928 Other abnormal and inconclusive findings on diagnostic imaging of breast: Secondary | ICD-10-CM

## 2015-07-23 ENCOUNTER — Ambulatory Visit
Admission: RE | Admit: 2015-07-23 | Discharge: 2015-07-23 | Disposition: A | Discharge status: Home / Self Care | Payer: Medicare Other | Source: Ambulatory Visit | Attending: Obstetrics and Gynecology | Admitting: Obstetrics and Gynecology

## 2015-07-23 DIAGNOSIS — R928 Other abnormal and inconclusive findings on diagnostic imaging of breast: Secondary | ICD-10-CM

## 2016-04-09 ENCOUNTER — Emergency Department (HOSPITAL_COMMUNITY): Payer: Medicare HMO

## 2016-04-09 ENCOUNTER — Encounter (HOSPITAL_COMMUNITY): Payer: Self-pay | Admitting: Emergency Medicine

## 2016-04-09 ENCOUNTER — Observation Stay (HOSPITAL_COMMUNITY): Payer: Medicare HMO

## 2016-04-09 ENCOUNTER — Observation Stay (HOSPITAL_COMMUNITY)
Admission: EM | Admit: 2016-04-09 | Discharge: 2016-04-12 | Disposition: A | Discharge status: Home / Self Care | Payer: Medicare HMO | Attending: Internal Medicine | Admitting: Internal Medicine

## 2016-04-09 DIAGNOSIS — M542 Cervicalgia: Secondary | ICD-10-CM

## 2016-04-09 DIAGNOSIS — G4733 Obstructive sleep apnea (adult) (pediatric): Secondary | ICD-10-CM | POA: Diagnosis present

## 2016-04-09 DIAGNOSIS — Z79899 Other long term (current) drug therapy: Secondary | ICD-10-CM | POA: Insufficient documentation

## 2016-04-09 DIAGNOSIS — E1122 Type 2 diabetes mellitus with diabetic chronic kidney disease: Secondary | ICD-10-CM

## 2016-04-09 DIAGNOSIS — I480 Paroxysmal atrial fibrillation: Secondary | ICD-10-CM | POA: Diagnosis present

## 2016-04-09 DIAGNOSIS — E119 Type 2 diabetes mellitus without complications: Secondary | ICD-10-CM | POA: Diagnosis not present

## 2016-04-09 DIAGNOSIS — R079 Chest pain, unspecified: Secondary | ICD-10-CM | POA: Diagnosis not present

## 2016-04-09 DIAGNOSIS — E1169 Type 2 diabetes mellitus with other specified complication: Secondary | ICD-10-CM

## 2016-04-09 DIAGNOSIS — R197 Diarrhea, unspecified: Secondary | ICD-10-CM | POA: Insufficient documentation

## 2016-04-09 DIAGNOSIS — N183 Chronic kidney disease, stage 3 (moderate): Secondary | ICD-10-CM

## 2016-04-09 DIAGNOSIS — R0789 Other chest pain: Secondary | ICD-10-CM | POA: Diagnosis present

## 2016-04-09 DIAGNOSIS — R112 Nausea with vomiting, unspecified: Secondary | ICD-10-CM | POA: Diagnosis not present

## 2016-04-09 DIAGNOSIS — I110 Hypertensive heart disease with heart failure: Secondary | ICD-10-CM | POA: Insufficient documentation

## 2016-04-09 DIAGNOSIS — I482 Chronic atrial fibrillation: Secondary | ICD-10-CM | POA: Diagnosis not present

## 2016-04-09 DIAGNOSIS — J449 Chronic obstructive pulmonary disease, unspecified: Secondary | ICD-10-CM | POA: Insufficient documentation

## 2016-04-09 DIAGNOSIS — Z794 Long term (current) use of insulin: Secondary | ICD-10-CM | POA: Diagnosis not present

## 2016-04-09 DIAGNOSIS — I5032 Chronic diastolic (congestive) heart failure: Secondary | ICD-10-CM | POA: Insufficient documentation

## 2016-04-09 DIAGNOSIS — R110 Nausea: Secondary | ICD-10-CM

## 2016-04-09 DIAGNOSIS — I4891 Unspecified atrial fibrillation: Secondary | ICD-10-CM | POA: Diagnosis present

## 2016-04-09 HISTORY — DX: Unspecified asthma, uncomplicated: J45.909

## 2016-04-09 LAB — CBC WITH DIFFERENTIAL/PLATELET
Basophils Absolute: 0 10*3/uL (ref 0.0–0.1)
Basophils Relative: 0 %
Eosinophils Absolute: 0.3 10*3/uL (ref 0.0–0.7)
Eosinophils Relative: 2 %
HCT: 41.1 % (ref 36.0–46.0)
Hemoglobin: 13.2 g/dL (ref 12.0–15.0)
Lymphocytes Relative: 18 %
Lymphs Abs: 2.2 10*3/uL (ref 0.7–4.0)
MCH: 29.5 pg (ref 26.0–34.0)
MCHC: 32.1 g/dL (ref 30.0–36.0)
MCV: 91.7 fL (ref 78.0–100.0)
Monocytes Absolute: 1.2 10*3/uL — ABNORMAL HIGH (ref 0.1–1.0)
Monocytes Relative: 10 %
Neutro Abs: 8.5 10*3/uL — ABNORMAL HIGH (ref 1.7–7.7)
Neutrophils Relative %: 70 %
Platelets: 214 10*3/uL (ref 150–400)
RBC: 4.48 MIL/uL (ref 3.87–5.11)
RDW: 13.1 % (ref 11.5–15.5)
WBC: 12.2 10*3/uL — ABNORMAL HIGH (ref 4.0–10.5)

## 2016-04-09 LAB — URINALYSIS, ROUTINE W REFLEX MICROSCOPIC
Bilirubin Urine: NEGATIVE
Glucose, UA: NEGATIVE mg/dL
Hgb urine dipstick: NEGATIVE
Ketones, ur: NEGATIVE mg/dL
Leukocytes, UA: NEGATIVE
Nitrite: NEGATIVE
Protein, ur: NEGATIVE mg/dL
Specific Gravity, Urine: 1.02 (ref 1.005–1.030)
pH: 5.5 (ref 5.0–8.0)

## 2016-04-09 LAB — COMPREHENSIVE METABOLIC PANEL
ALT: 13 U/L — ABNORMAL LOW (ref 14–54)
AST: 16 U/L (ref 15–41)
Albumin: 3.3 g/dL — ABNORMAL LOW (ref 3.5–5.0)
Alkaline Phosphatase: 72 U/L (ref 38–126)
Anion gap: 11 (ref 5–15)
BUN: 11 mg/dL (ref 6–20)
CO2: 22 mmol/L (ref 22–32)
Calcium: 8.7 mg/dL — ABNORMAL LOW (ref 8.9–10.3)
Chloride: 106 mmol/L (ref 101–111)
Creatinine, Ser: 1.41 mg/dL — ABNORMAL HIGH (ref 0.44–1.00)
GFR calc Af Amer: 46 mL/min — ABNORMAL LOW (ref >60)
GFR calc non Af Amer: 40 mL/min — ABNORMAL LOW (ref >60)
Glucose, Bld: 229 mg/dL — ABNORMAL HIGH (ref 65–99)
Potassium: 4.4 mmol/L (ref 3.5–5.1)
Sodium: 139 mmol/L (ref 135–145)
Total Bilirubin: 0.4 mg/dL (ref 0.3–1.2)
Total Protein: 7.6 g/dL (ref 6.5–8.1)

## 2016-04-09 LAB — TYPE AND SCREEN
ABO/RH(D): A POS
Antibody Screen: NEGATIVE

## 2016-04-09 LAB — MAGNESIUM: Magnesium: 1.3 mg/dL — ABNORMAL LOW (ref 1.7–2.4)

## 2016-04-09 LAB — ABO/RH: ABO/RH(D): A POS

## 2016-04-09 LAB — I-STAT TROPONIN, ED
Troponin i, poc: 0 ng/mL (ref 0.00–0.08)
Troponin i, poc: 0 ng/mL (ref 0.00–0.08)

## 2016-04-09 LAB — GLUCOSE, CAPILLARY: Glucose-Capillary: 221 mg/dL — ABNORMAL HIGH (ref 65–99)

## 2016-04-09 LAB — DIGOXIN LEVEL: Digoxin Level: 1.1 ng/mL (ref 0.8–2.0)

## 2016-04-09 LAB — BRAIN NATRIURETIC PEPTIDE: B Natriuretic Peptide: 58.2 pg/mL (ref 0.0–100.0)

## 2016-04-09 LAB — LIPASE, BLOOD: Lipase: 18 U/L (ref 11–51)

## 2016-04-09 LAB — I-STAT CG4 LACTIC ACID, ED
Lactic Acid, Venous: 1.73 mmol/L (ref 0.5–1.9)
Lactic Acid, Venous: 1.81 mmol/L (ref 0.5–1.9)

## 2016-04-09 LAB — TROPONIN I: Troponin I: <0.03 ng/mL (ref <0.03)

## 2016-04-09 LAB — POC OCCULT BLOOD, ED: Fecal Occult Bld: POSITIVE — AB

## 2016-04-09 MED ORDER — MULTIVITAMIN WOMEN PO TABS: ORAL_TABLET | Freq: Every day | ORAL | Status: DC

## 2016-04-09 MED ORDER — OXYCODONE HCL 5 MG PO TABS
5.0000 mg | ORAL_TABLET | ORAL | Status: DC | PRN
  Administered 2016-04-09 – 2016-04-11 (×4): 5 mg via ORAL
  Filled 2016-04-09 (×4): qty 1

## 2016-04-09 MED ORDER — INSULIN ASPART 100 UNIT/ML ~~LOC~~ SOLN
0.0000 [IU] | Freq: Three times a day (TID) | SUBCUTANEOUS | Status: DC
  Administered 2016-04-10: 5 [IU] via SUBCUTANEOUS
  Administered 2016-04-10 (×2): 3 [IU] via SUBCUTANEOUS
  Administered 2016-04-11 (×2): 2 [IU] via SUBCUTANEOUS
  Administered 2016-04-12: 5 [IU] via SUBCUTANEOUS
  Administered 2016-04-12: 3 [IU] via SUBCUTANEOUS
  Administered 2016-04-12: 9 [IU] via SUBCUTANEOUS

## 2016-04-09 MED ORDER — ONDANSETRON HCL 4 MG/2ML IJ SOLN
4.0000 mg | Freq: Once | INTRAMUSCULAR | Status: AC
  Administered 2016-04-09: 4 mg via INTRAVENOUS
  Filled 2016-04-09: qty 2

## 2016-04-09 MED ORDER — SODIUM CHLORIDE 0.9% FLUSH
3.0000 mL | Freq: Two times a day (BID) | INTRAVENOUS | Status: DC
  Administered 2016-04-09 – 2016-04-12 (×4): 3 mL via INTRAVENOUS

## 2016-04-09 MED ORDER — DABIGATRAN ETEXILATE MESYLATE 75 MG PO CAPS
75.0000 mg | ORAL_CAPSULE | Freq: Two times a day (BID) | ORAL | Status: DC
  Administered 2016-04-09: 75 mg via ORAL
  Filled 2016-04-09 (×2): qty 1

## 2016-04-09 MED ORDER — ONDANSETRON HCL 4 MG PO TABS: 4.0000 mg | ORAL_TABLET | Freq: Four times a day (QID) | ORAL | Status: DC | PRN

## 2016-04-09 MED ORDER — CARVEDILOL 3.125 MG PO TABS
3.1250 mg | ORAL_TABLET | Freq: Two times a day (BID) | ORAL | Status: DC
  Administered 2016-04-10 – 2016-04-12 (×4): 3.125 mg via ORAL
  Filled 2016-04-09 (×3): qty 1

## 2016-04-09 MED ORDER — IOPAMIDOL (ISOVUE-370) INJECTION 76%
INTRAVENOUS | Status: AC
  Administered 2016-04-09: 80 mL
  Filled 2016-04-09: qty 100

## 2016-04-09 MED ORDER — INSULIN DETEMIR 100 UNIT/ML ~~LOC~~ SOLN
5.0000 [IU] | Freq: Every day | SUBCUTANEOUS | Status: DC
  Administered 2016-04-09 – 2016-04-10 (×2): 5 [IU] via SUBCUTANEOUS
  Filled 2016-04-09 (×3): qty 0.05

## 2016-04-09 MED ORDER — GABAPENTIN 300 MG PO CAPS
300.0000 mg | ORAL_CAPSULE | Freq: Every day | ORAL | Status: DC
  Administered 2016-04-09 – 2016-04-11 (×3): 300 mg via ORAL
  Filled 2016-04-09 (×3): qty 1

## 2016-04-09 MED ORDER — ROSUVASTATIN CALCIUM 10 MG PO TABS
10.0000 mg | ORAL_TABLET | Freq: Every day | ORAL | Status: DC
  Administered 2016-04-12: 10 mg via ORAL
  Filled 2016-04-09: qty 1

## 2016-04-09 MED ORDER — SODIUM CHLORIDE 0.9 % IV BOLUS (SEPSIS)
1000.0000 mL | Freq: Once | INTRAVENOUS | Status: AC
  Administered 2016-04-09: 1000 mL via INTRAVENOUS

## 2016-04-09 MED ORDER — NITROGLYCERIN 0.4 MG SL SUBL: 0.4000 mg | SUBLINGUAL_TABLET | SUBLINGUAL | Status: DC | PRN

## 2016-04-09 MED ORDER — SPIRONOLACTONE 25 MG PO TABS
25.0000 mg | ORAL_TABLET | Freq: Every day | ORAL | Status: DC
  Administered 2016-04-10 – 2016-04-12 (×3): 25 mg via ORAL
  Filled 2016-04-09 (×3): qty 1

## 2016-04-09 MED ORDER — GI COCKTAIL ~~LOC~~
30.0000 mL | Freq: Once | ORAL | Status: AC
  Administered 2016-04-09: 30 mL via ORAL
  Filled 2016-04-09: qty 30

## 2016-04-09 MED ORDER — INSULIN ASPART 100 UNIT/ML ~~LOC~~ SOLN
0.0000 [IU] | Freq: Every day | SUBCUTANEOUS | Status: DC
  Administered 2016-04-09 – 2016-04-10 (×2): 2 [IU] via SUBCUTANEOUS

## 2016-04-09 MED ORDER — DICYCLOMINE HCL 10 MG PO CAPS
10.0000 mg | ORAL_CAPSULE | Freq: Once | ORAL | Status: AC
  Administered 2016-04-09: 10 mg via ORAL
  Filled 2016-04-09: qty 1

## 2016-04-09 MED ORDER — FUROSEMIDE 40 MG PO TABS
40.0000 mg | ORAL_TABLET | Freq: Every day | ORAL | Status: DC
  Administered 2016-04-10 – 2016-04-12 (×3): 40 mg via ORAL
  Filled 2016-04-09 (×3): qty 1

## 2016-04-09 MED ORDER — ASPIRIN 81 MG PO CHEW: 324.0000 mg | CHEWABLE_TABLET | Freq: Once | ORAL | Status: DC

## 2016-04-09 MED ORDER — ADULT MULTIVITAMIN W/MINERALS CH
1.0000 | ORAL_TABLET | Freq: Every day | ORAL | Status: DC
  Administered 2016-04-10 – 2016-04-12 (×2): 1 via ORAL
  Filled 2016-04-09 (×2): qty 1

## 2016-04-09 MED ORDER — ONDANSETRON HCL 4 MG/2ML IJ SOLN: 4.0000 mg | Freq: Four times a day (QID) | INTRAMUSCULAR | Status: DC | PRN

## 2016-04-09 MED ORDER — CLORAZEPATE DIPOTASSIUM 3.75 MG PO TABS
7.5000 mg | ORAL_TABLET | Freq: Every day | ORAL | Status: DC
  Administered 2016-04-09 – 2016-04-12 (×4): 7.5 mg via ORAL
  Filled 2016-04-09 (×4): qty 2

## 2016-04-09 MED ORDER — DIGOXIN 125 MCG PO TABS
0.1250 mg | ORAL_TABLET | Freq: Every day | ORAL | Status: DC
  Administered 2016-04-10 – 2016-04-12 (×3): 0.125 mg via ORAL
  Filled 2016-04-09 (×3): qty 1

## 2016-04-09 MED ORDER — ISOSORBIDE MONONITRATE ER 30 MG PO TB24
30.0000 mg | ORAL_TABLET | Freq: Every day | ORAL | Status: DC
  Administered 2016-04-09 – 2016-04-12 (×4): 30 mg via ORAL
  Filled 2016-04-09 (×4): qty 1

## 2016-04-09 NOTE — ED Provider Notes (Signed)
[facility] DEPT Provider Note   CSN: [id number] Arrival date & time: 04/09/16  [id number]     History   Chief Complaint Chief Complaint  Patient presents with  . Chest Pain  . Shortness of Breath  . Emesis  . Diarrhea  . Arm Pain  . Dizziness  . Abdominal Pain    HPI Aleiah Belfield is a 60 y.o. female.  HPI  33--year-old female with a history of chronic diastolic CHF, COPD, diabetes,prior DVT, atrial fibrillation on Pradaxa, hypertension, hyperlipidemia, obesity, obstructive sleep apnea, hysterectomy, cholecystectomy wwho presents to the emergency department with chest pain, abdominal pain, nausea, vomiting, diarrhea, left arm pain, and left leg pain.  Chest pain beganthree days ago, is both heaviness, and sharp. Nothing seems to make it better or worse. It radiates to the left arm. Is associated with nausea, dyspnea, and diaphoresis.  Patient also reports abdominal pain beginning three days ago, which is diffuse, nothing makes a better worse. Has associated nausea and vomitig.  Reports about 3 to 4 episodes of diarrhea per day, reports that occurs every time she tries to eat or drink anything.  Reports the diarrhea is black in color. Reports he took the Pepto-Bismol, and after this, her stool appeared black.  Past Medical History:  Diagnosis Date  . Allergic rhinitis   . Arthritis   . Asthma   . Chronic diastolic CHF (congestive heart failure) ([place])   . COPD (chronic obstructive pulmonary disease) ([place])   . Depression   . DM (diabetes mellitus) ([place])   . DVT (deep vein thrombosis) in pregnancy   . Dysrhythmia    atrial fibrilation  . GERD (gastroesophageal reflux disease)   . Headache(784.0)   . HTN (hypertension)   . Hyperlipidemia   . Obesity   . Shortness of breath   . Sleep apnea    compliant with CPAP    Patient Active Problem List   Diagnosis Date Noted  . Chest pain 02/26/2012  . OSA (obstructive sleep apnea) 09/21/2011  . Hypoxia 09/21/2011  .  Diabetes mellitus ([place]) 02/04/2010  . Morbid obesity ([place]) 02/04/2010  . Atrial fibrillation ([place]) 02/04/2010  . GASTROPARESIS 02/04/2010  . GASTROENTERITIS 02/04/2010    Past Surgical History:  Procedure Laterality Date  . ABDOMINAL HYSTERECTOMY    . CHOLECYSTECTOMY    . COLONOSCOPY WITH PROPOFOL N/A 03/05/2015   Procedure: COLONOSCOPY WITH PROPOFOL;  Surgeon: Carol Ada, MD;  Location: [facility] ENDOSCOPY;  Service: Endoscopy;  Laterality: N/A;  . DIAGNOSTIC LAPAROSCOPY    . ingrown hallux Left   . KNEE SURGERY      OB History    No data available       Home Medications    Prior to Admission medications   Medication Sig Start Date End Date Taking? Authorizing Provider  camphor-menthol (ANTI-ITCH) lotion Apply 1 application topically as needed for itching.   Yes Historical Provider, MD  carvedilol (COREG) 12.5 MG tablet Take 12.5 mg by mouth 2 (two) times daily with a meal.   Yes Historical Provider, MD  clorazepate (TRANXENE) 7.5 MG tablet Take 7.5 mg by mouth daily.   Yes Historical Provider, MD  dabigatran (PRADAXA) 75 MG CAPS capsule Take 75 mg by mouth 2 (two) times daily.   Yes Historical Provider, MD  digoxin (LANOXIN) 0.25 MG tablet Take 0.5 tablets (0.125 mg total) by mouth daily. 03/21/15  Yes Dixie Dials, MD  diphenhydrAMINE (BENADRYL) 25 MG tablet Take 25 mg by mouth every 4 (four) hours as needed for  itching.   Yes Historical Provider, MD  furosemide (LASIX) 40 MG tablet Take 40 mg by mouth daily.  09/13/11  Yes Historical Provider, MD  gabapentin (NEURONTIN) 300 MG capsule Take 300 mg by mouth at bedtime.  09/13/11  Yes Historical Provider, MD  insulin aspart (NOVOLOG) 100 UNIT/ML injection Inject 5-10 Units into the skin every 6 (six) hours. 200-300 takes 5 units, 301-400 takes 7 units, 401-500 takes 12 units. Sliding Scale   Yes Historical Provider, MD  insulin detemir (LEVEMIR) 100 UNIT/ML injection Inject 5 Units into the skin 2 (two) times daily as needed (blood  sugar over 200).    Yes Historical Provider, MD  isosorbide mononitrate (IMDUR) 30 MG 24 hr tablet Take 1 tablet (30 mg total) by mouth daily. 03/21/15  Yes Dixie Dials, MD  losartan (COZAAR) 50 MG tablet Take 50 mg by mouth daily with breakfast.  05/27/12  Yes Historical Provider, MD  Multiple Vitamins-Minerals (MULTIVITAMIN WOMEN PO) Take 1 tablet by mouth daily.   Yes Historical Provider, MD  nitroGLYCERIN (NITROSTAT) 0.4 MG SL tablet Place 1 tablet (0.4 mg total) under the tongue every 5 (five) minutes x 3 doses as needed for chest pain. 01/08/14  Yes Charolette Forward, MD  omeprazole (PRILOSEC) 40 MG capsule Take 40 mg by mouth every morning.  08/30/11  Yes Historical Provider, MD  PATANOL 0.1 % ophthalmic solution Place 1 drop into both eyes 2 (two) times daily.  09/13/11  Yes Historical Provider, MD  potassium chloride SA (K-DUR,KLOR-CON) 20 MEQ tablet Take 20 mEq by mouth 2 (two) times daily.  09/13/11  Yes Historical Provider, MD  PROAIR HFA 108 (90 BASE) MCG/ACT inhaler Inhale 2 puffs into the lungs every 4 (four) hours as needed for wheezing (wheezing).  05/28/12  Yes Historical Provider, MD  rosuvastatin (CRESTOR) 10 MG tablet Take 10 mg by mouth daily with breakfast.   Yes Historical Provider, MD  sitaGLIPtin (JANUVIA) 50 MG tablet Take 50 mg by mouth daily with breakfast.   Yes Historical Provider, MD  spironolactone (ALDACTONE) 25 MG tablet Take 25 mg by mouth daily.  09/13/11  Yes Historical Provider, MD    Family History Family History  Problem Relation Age of Onset  . Cancer Mother   . Hypertension Mother   . Cancer Father   . CAD    . Hypertension Sister   . Hypertension Brother     Social History Social History  Substance Use Topics  . Smoking status: Never Smoker  . Smokeless tobacco: Never Used  . Alcohol use No     Allergies   Nsaids and Sulfa antibiotics   Review of Systems Review of Systems  Constitutional: Positive for diaphoresis and fatigue. Negative for fever.   HENT: Negative for sore throat.   Eyes: Negative for visual disturbance.  Respiratory: Positive for cough and shortness of breath.   Cardiovascular: Positive for chest pain.  Gastrointestinal: Positive for abdominal pain, diarrhea, nausea and vomiting.  Genitourinary: Negative for difficulty urinating, vaginal bleeding and vaginal discharge.  Musculoskeletal: Positive for arthralgias and back pain. Negative for neck pain.  Skin: Negative for rash.  Neurological: Positive for numbness (bilateral feet). Negative for syncope, weakness and headaches.     Physical Exam Updated Vital Signs BP 131/70 (BP Location: Right Arm)   Pulse 75   Temp 98 F (36.7 C) (Oral)   Resp 18   Ht 5\' 6"  (1.676 m)   Wt (!) 325 lb 12.8 oz (147.8 kg)   SpO2 94%  BMI 52.59 kg/m   Physical Exam  Constitutional: She is oriented to person, place, and time. She appears well-developed and well-nourished. No distress.  HENT:  Head: Normocephalic and atraumatic.  Eyes: Conjunctivae and EOM are normal.  Neck: Normal range of motion.  Cardiovascular: Normal rate, normal heart sounds and intact distal pulses.  An irregularly irregular rhythm present. Exam reveals no gallop and no friction rub.   No murmur heard. Pulmonary/Chest: Effort normal and breath sounds normal. No respiratory distress. She has no wheezes. She has no rales. She exhibits tenderness.  Abdominal: Soft. She exhibits no distension. There is tenderness (diffuse). There is no guarding.  Musculoskeletal: She exhibits no edema or tenderness.  Neurological: She is alert and oriented to person, place, and time. She has normal strength. GCS eye subscore is 4. GCS verbal subscore is 5. GCS motor subscore is 6.  Skin: Skin is warm and dry. No rash noted. She is not diaphoretic. No erythema.  Nursing note and vitals reviewed.    ED Treatments / Results  Labs (all labs ordered are listed, but only abnormal results are displayed) Labs Reviewed  CBC  WITH DIFFERENTIAL/PLATELET - Abnormal; Notable for the following:       Result Value   WBC 12.2 (*)    Neutro Abs 8.5 (*)    Monocytes Absolute 1.2 (*)    All other components within normal limits  COMPREHENSIVE METABOLIC PANEL - Abnormal; Notable for the following:    Glucose, Bld 229 (*)    Creatinine, Ser 1.41 (*)    Calcium 8.7 (*)    Albumin 3.3 (*)    ALT 13 (*)    GFR calc non Af Amer 40 (*)    GFR calc Af Amer 46 (*)    All other components within normal limits  MAGNESIUM - Abnormal; Notable for the following:    Magnesium 1.3 (*)    All other components within normal limits  URINALYSIS, ROUTINE W REFLEX MICROSCOPIC (NOT AT [facility]) - Abnormal; Notable for the following:    APPearance HAZY (*)    All other components within normal limits  GLUCOSE, CAPILLARY - Abnormal; Notable for the following:    Glucose-Capillary 221 (*)    All other components within normal limits  POC OCCULT BLOOD, ED - Abnormal; Notable for the following:    Fecal Occult Bld POSITIVE (*)    All other components within normal limits  LIPASE, BLOOD  DIGOXIN LEVEL  BRAIN NATRIURETIC PEPTIDE  TROPONIN I  HEMOGLOBIN A1C  TROPONIN I  CBC  COMPREHENSIVE METABOLIC PANEL  TROPONIN I  I-STAT TROPOININ, ED  I-STAT CG4 LACTIC ACID, ED  I-STAT CG4 LACTIC ACID, ED  I-STAT TROPOININ, ED  TYPE AND SCREEN  ABO/RH    EKG  EKG Interpretation  Date/Time:  Sunday April 09 2016 09:35:28 EDT Ventricular Rate:  107 PR Interval:    QRS Duration: 94 QT Interval:  320 QTC Calculation: 433 R Axis:   134 Text Interpretation:  Atrial fibrillation Left posterior fascicular block Consider anterior infarct Borderline T abnormalities, inferior leads No significant change since last tracing Confirmed by [facility] MD, Tache Bobst ([id number]) on 04/09/2016 10:06:54 AM       Radiology Dg Chest 2 View  Result Date: 04/09/2016 CLINICAL DATA:  Chest and abdominal pain, 3 days duration. Vomiting after meals. EXAM: CHEST  2 VIEW  COMPARISON:  03/19/2015 FINDINGS: The heart is chronically enlarged. There is aortic atherosclerosis. The pulmonary vascularity is normal. The lungs are clear. No effusions.  No bony abnormalities. IMPRESSION: Cardiomegaly.  Aortic atherosclerosis.  No active process otherwise. Electronically Signed   By: Nelson Chimes M.D.   On: 04/09/2016 10:08   Ct Angio Chest/abd/pel For Dissection W And/or Wo Contrast  Result Date: 04/09/2016 CLINICAL DATA:  Chest and abdominal pain for 3 days EXAM: CT ANGIOGRAPHY CHEST, ABDOMEN AND PELVIS TECHNIQUE: Multidetector CT imaging through the chest, abdomen and pelvis was performed using the standard protocol during bolus administration of intravenous contrast. Multiplanar reconstructed images and MIPs were obtained and reviewed to evaluate the vascular anatomy. CONTRAST:  80 mL Isovue 370. COMPARISON:  None. FINDINGS: CTA CHEST FINDINGS Vascular: The thoracic aorta shows a truncus anomaly. No aneurysmal dilatation or dissection is identified. No significant calcific changes are seen. Pulmonary artery demonstrates a normal branching pattern without definitive pulmonary embolus. Cardiac structures are within normal limits. No right heart strain is noted. Nonvascular: No hilar or mediastinal adenopathy is seen. The thoracic inlet is within normal limits. The lungs are well-aerated without focal infiltrate or sizable effusion. Degenerative changes of the thoracic spine are noted. Review of the MIP images confirms the above findings. CTA ABDOMEN AND PELVIS FINDINGS Vascular: The abdominal aorta shows no aneurysmal dilatation or dissection. A normal branching pattern is seen with the visceral vessels being widely patent. No significant atherosclerotic calcifications are seen. Single renal arteries are identified bilaterally. The left renal artery distally demonstrates multifocal aneurysmal dilatation. Two areas are noted 1 more superiorly which measures 18 mm in greatest dimension 1  slightly more inferior measuring 16 mm in dimension. No obstruction of flow is seen. Mild thrombus is noted within the aneurysms. Nonvascular: The liver, spleen, adrenal glands and pancreas are within normal limits. The gallbladder has been surgically removed. The appendix is within normal limits. Fecal material is noted throughout the colon. No obstructive changes are noted. No free air or free fluid is noted within the pelvis. Uterus has been surgically removed. No acute bony abnormality is noted. Multilevel lumbar degenerative changes are seen. Mild thickening of the distal esophagus is noted which may represent esophagitis. Review of the MIP images confirms the above findings. IMPRESSION: CTA of the chest: Truncus anomaly of the thoracic aorta. No evidence of aneurysmal dilatation or dissection. 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 No aortic aneurysm or dissection is seen. Mild thickening of the distal esophagus which may represent esophagitis. Electronically Signed   By: Inez Catalina M.D.   On: 04/09/2016 18:31    Procedures Procedures (including critical care time)  Medications Ordered in ED Medications  aspirin chewable tablet 324 mg (324 mg Oral Not Given 04/09/16 1633)  nitroGLYCERIN (NITROSTAT) SL tablet 0.4 mg (not administered)  sodium chloride flush (NS) 0.9 % injection 3 mL (3 mLs Intravenous Given 04/09/16 2200)  oxyCODONE (Oxy IR/ROXICODONE) immediate release tablet 5 mg (5 mg Oral Given 04/09/16 2113)  ondansetron (ZOFRAN) tablet 4 mg (not administered)    Or  ondansetron (ZOFRAN) injection 4 mg (not administered)  isosorbide mononitrate (IMDUR) 24 hr tablet 30 mg (30 mg Oral Given 04/09/16 2113)  carvedilol (COREG) tablet 3.125 mg (not administered)  clorazepate (TRANXENE) tablet 7.5 mg (7.5 mg Oral Given 04/09/16 2113)  dabigatran (PRADAXA) capsule 75 mg (75 mg Oral Given 04/09/16 2113)  rosuvastatin (CRESTOR) tablet 10 mg (not  administered)  insulin detemir (LEVEMIR) injection 5 Units (5 Units Subcutaneous Given 04/09/16 2115)  furosemide (LASIX) tablet 40 mg (not administered)  gabapentin (NEURONTIN) capsule 300 mg (  300 mg Oral Given 04/09/16 2113)  spironolactone (ALDACTONE) tablet 25 mg (not administered)  insulin aspart (novoLOG) injection 0-9 Units (not administered)  insulin aspart (novoLOG) injection 0-5 Units (2 Units Subcutaneous Given 04/09/16 2115)  digoxin (LANOXIN) tablet 0.125 mg (not administered)  multivitamin with minerals tablet 1 tablet (0 tablets Oral Duplicate [id number])  ondansetron (ZOFRAN) injection 4 mg (4 mg Intravenous Given 04/09/16 1027)  sodium chloride 0.9 % bolus 1,000 mL (0 mLs Intravenous Stopped 04/09/16 1842)  gi cocktail (Maalox,Lidocaine,Donnatal) (30 mLs Oral Given 04/09/16 1438)  dicyclomine (BENTYL) capsule 10 mg (10 mg Oral Given 04/09/16 1438)  iopamidol (ISOVUE-370) 76 % injection (80 mLs  Contrast Given 04/09/16 1743)     Initial Impression / Assessment and Plan / ED Course  I have reviewed the triage vital signs and the nursing notes.  Pertinent labs & imaging results that were available during my care of the patient were reviewed by me and considered in my medical decision making (see chart for details).  Clinical Course    9--year-old female with a history of chronic diastolic CHF, COPD, diabetes,prior DVT, atrial fibrillation on Pradaxa, hypertension, hyperlipidemia, obesity, obstructive sleep apnea, hysterectomy, cholecystectomy wwho presents to the emergency department with chest pain, abdominal pain, nausea, vomiting, diarrhea, left arm pain, and left leg pain.    Regarding chest pain:  EKG shows atrial fibrillation without acute ST changes.delta troponins are negative.initially, low suspicion for dissection, given normal media sign in on chest x-ray, normal pulses bilaterally.  Given diffuse chest pain and abdominal pain, have low suspicion for pulmonary embolus.  Overall, and setting of nausea, vomiting and diarrhea, with chest wall tenderness, suspect musculoskeletal etiology  Of chest pain, however, prior to time of planned discharge, patient began to develop worsening chest heaviness, with atrial fibrillation rate decreasing to 50s, and given patient's worsening pain, cardiac risk factors, feel observation is appropriate. Aspirin and Nitro are ordered. Given both chest pain and abdominal pain, little ordered CT dissection study to ensure  No other sign of dissection aology the symptoms.ppatient to be admitted to hospital service for further care.  Regarding abdominal pain, patients abdominal exam is nonfocal, have low suspicion for appendicitis, diverticulitis.  Doubt obstruction by history. Doubt mesenteric ischemia and setting of two negative lactic acid as well as combining chest pain. Urinalysis shows no sign of UTI. Lipase is normal. Again given chest pain and abdominal pain, will evaluate CT and you test abdomen pelvis to ensure no signs of dissection.   Patient with good pulses and strength and upper and lower extremities, have low suspicion for acute arterial occlusion, or CVA as cause of patients leg and arm pain.  Suspect gastroenteritis, with nausea, vomiting diarrhea, and muscular etiology of chest pain, with anxiety as a contributor, however, given patient's multiple risk factors, worsening pain, will admit for further observation and for ischemic chest pain, and check CT dissection study for other pathology.   Final Clinical Impressions(s) / ED Diagnoses   Final diagnoses:  Chest pain, unspecified chest pain type  Nausea vomiting and diarrhea    New Prescriptions Current Discharge Medication List       Gareth Morgan, MD 04/09/16 2130

## 2016-04-09 NOTE — H&P (Addendum)
History and Physical    [name] [id number] DOB: 19-Sep-1955 DOA: 04/09/2016  PCP: Patricia Nettle, MD  Outpatient Specialists: cardiology, Dr. Terrence Dupont  Patient coming from: home   Chief Complaint: chest pain  HPI: [name] is a 60 y.o. female with medical history significant of chronic combined systolic and diastolic heart failure, COPD, diabetes, atrial fibrillation, hypertension, hyperlipidemia, morbid obesity, obstructive sleep apnea, presents to the emergency room with a chief complaint of chest pain. Patient tells me that she's been having chest pain almost continuously for the past 3 days,  And feels like an elephant is sitting on her chest.  She is also complaining of shortness of breath associated with her chest pain.She is complaining of abdominal pain, nausea no vomiting however is having diarrhea. She complains of dark stools over the last several days.She has lightheadedness and dizziness.She complains of feeling very anxious over the last few days as well, and she does appear quite anxious in the emergency room. In the ED, initial troponin was negative, EKG shows A. Fib, her creatinine is elevated however it appears to be at baseline,she will undergo a CT angiogram of the chest per EDP and [facility] was asked for admission for chest pain rule out.   Review of Systems: As per HPI otherwise 10 point review of systems negative.   Past Medical History:  Diagnosis Date  . Allergic rhinitis   . Arthritis   . Chronic diastolic CHF (congestive heart failure) ([place])   . COPD (chronic obstructive pulmonary disease) ([place])   . Depression   . DM (diabetes mellitus) ([place])   . DVT (deep vein thrombosis) in pregnancy   . Dysrhythmia    atrial fibrilation  . GERD (gastroesophageal reflux disease)   . Headache(784.0)   . HTN (hypertension)   . Hyperlipidemia   . Obesity   . Shortness of breath   . Sleep apnea    compliant with CPAP    Past Surgical History:    Procedure Laterality Date  . ABDOMINAL HYSTERECTOMY    . CHOLECYSTECTOMY    . COLONOSCOPY WITH PROPOFOL N/A 03/05/2015   Procedure: COLONOSCOPY WITH PROPOFOL;  Surgeon: Carol Ada, MD;  Location: [facility];  Service: Endoscopy;  Laterality: N/A;  . DIAGNOSTIC LAPAROSCOPY    . ingrown hallux Left   . KNEE SURGERY       reports that she has never smoked. She has never used smokeless tobacco. She reports that she does not drink alcohol or use drugs.  Allergies  Allergen Reactions  . Nsaids Other (See Comments)    On warfarin  . Sulfa Antibiotics Itching    Family History  Problem Relation Age of Onset  . Cancer Mother   . Hypertension Mother   . Cancer Father   . CAD    . Hypertension Sister   . Hypertension Brother     Prior to Admission medications   Medication Sig Start Date End Date Taking? Authorizing Provider  camphor-menthol (ANTI-ITCH) lotion Apply 1 application topically as needed for itching.   Yes Historical Provider, MD  carvedilol (COREG) 12.5 MG tablet Take 12.5 mg by mouth 2 (two) times daily with a meal.   Yes Historical Provider, MD  clorazepate (TRANXENE) 7.5 MG tablet Take 7.5 mg by mouth daily.   Yes Historical Provider, MD  dabigatran (PRADAXA) 75 MG CAPS capsule Take 75 mg by mouth 2 (two) times daily.   Yes Historical Provider, MD  digoxin (LANOXIN) 0.25 MG tablet Take 0.5 tablets (0.125 mg total)  by mouth daily. 03/21/15  Yes Dixie Dials, MD  diphenhydrAMINE (BENADRYL) 25 MG tablet Take 25 mg by mouth every 4 (four) hours as needed for itching.   Yes Historical Provider, MD  furosemide (LASIX) 40 MG tablet Take 40 mg by mouth daily.  09/13/11  Yes Historical Provider, MD  gabapentin (NEURONTIN) 300 MG capsule Take 300 mg by mouth at bedtime.  09/13/11  Yes Historical Provider, MD  insulin aspart (NOVOLOG) 100 UNIT/ML injection Inject 5-10 Units into the skin every 6 (six) hours. 200-300 takes 5 units, 301-400 takes 7 units, 401-500 takes 12  units. Sliding Scale   Yes Historical Provider, MD  insulin detemir (LEVEMIR) 100 UNIT/ML injection Inject 5 Units into the skin 2 (two) times daily as needed (blood sugar over 200).    Yes Historical Provider, MD  isosorbide mononitrate (IMDUR) 30 MG 24 hr tablet Take 1 tablet (30 mg total) by mouth daily. 03/21/15  Yes Dixie Dials, MD  losartan (COZAAR) 50 MG tablet Take 50 mg by mouth daily with breakfast.  05/27/12  Yes Historical Provider, MD  Multiple Vitamins-Minerals (MULTIVITAMIN WOMEN PO) Take 1 tablet by mouth daily.   Yes Historical Provider, MD  nitroGLYCERIN (NITROSTAT) 0.4 MG SL tablet Place 1 tablet (0.4 mg total) under the tongue every 5 (five) minutes x 3 doses as needed for chest pain. 01/08/14  Yes Charolette Forward, MD  omeprazole (PRILOSEC) 40 MG capsule Take 40 mg by mouth every morning.  08/30/11  Yes Historical Provider, MD  PATANOL 0.1 % ophthalmic solution Place 1 drop into both eyes 2 (two) times daily.  09/13/11  Yes Historical Provider, MD  potassium chloride SA (K-DUR,KLOR-CON) 20 MEQ tablet Take 20 mEq by mouth 2 (two) times daily.  09/13/11  Yes Historical Provider, MD  PROAIR HFA 108 (90 BASE) MCG/ACT inhaler Inhale 2 puffs into the lungs every 4 (four) hours as needed for wheezing (wheezing).  05/28/12  Yes Historical Provider, MD  rosuvastatin (CRESTOR) 10 MG tablet Take 10 mg by mouth daily with breakfast.   Yes Historical Provider, MD  sitaGLIPtin (JANUVIA) 50 MG tablet Take 50 mg by mouth daily with breakfast.   Yes Historical Provider, MD  spironolactone (ALDACTONE) 25 MG tablet Take 25 mg by mouth daily.  09/13/11  Yes Historical Provider, MD    Physical Exam: Vitals:   04/09/16 1500 04/09/16 1530 04/09/16 1600 04/09/16 1630  BP: 102/71 119/91 107/65 108/61  Pulse: 88 92 99 86  Resp: 15 19 25 18   Temp:      TempSrc:      SpO2: 99% 98% 100% 99%  Weight:      Height:          Constitutional: NAD, appears anxious Vitals:   04/09/16 1500 04/09/16 1530  04/09/16 1600 04/09/16 1630  BP: 102/71 119/91 107/65 108/61  Pulse: 88 92 99 86  Resp: 15 19 25 18   Temp:      TempSrc:      SpO2: 99% 98% 100% 99%  Weight:      Height:       Eyes: PERRL, lids and conjunctivae normal ENMT: Mucous membranes are moist.  Neck: normal, supple Respiratory: clear to auscultation bilaterally, no wheezing, no crackles. Normal respiratory effort. No accessory muscle use.   Cardiovascular: irregular, no murmurs / rubs / gallops. 1+ LE edema. 2+ pedal pulses. No carotid bruits.  Abdomen: no tenderness, no masses palpated. No hepatosplenomegaly. Bowel sounds positive.  Musculoskeletal: no clubbing / cyanosis.  Skin: no  rashes, lesions, ulcers. No induration Neurologic: non focal  Psychiatric: Normal judgment and insight. Alert and oriented x 3. Anxious   Labs on Admission: I have personally reviewed following labs and imaging studies  CBC:  Recent Labs Lab 04/09/16 1033  WBC 12.2*  NEUTROABS 8.5*  HGB 13.2  HCT 41.1  MCV 91.7  PLT [id number]   Basic Metabolic Panel:  Recent Labs Lab 04/09/16 1033 04/09/16 1042  NA 139  --   K 4.4  --   CL 106  --   CO2 22  --   GLUCOSE 229*  --   BUN 11  --   CREATININE 1.41*  --   CALCIUM 8.7*  --   MG  --  1.3*   GFR: Estimated Creatinine Clearance: 60.4 mL/min (by C-G formula based on SCr of 1.41 mg/dL). Liver Function Tests:  Recent Labs Lab 04/09/16 1033  AST 16  ALT 13*  ALKPHOS 72  BILITOT 0.4  PROT 7.6  ALBUMIN 3.3*    Recent Labs Lab 04/09/16 1042  LIPASE 18   Urine analysis:    Component Value Date/Time   COLORURINE YELLOW 04/09/2016 1245   APPEARANCEUR HAZY (A) 04/09/2016 1245   LABSPEC 1.020 04/09/2016 1245   PHURINE 5.5 04/09/2016 1245   GLUCOSEU NEGATIVE 04/09/2016 1245   [place] 04/09/2016 1245   [place] 04/09/2016 1245   KETONESUR NEGATIVE 04/09/2016 1245   PROTEINUR NEGATIVE 04/09/2016 1245   UROBILINOGEN 1.0 10/26/2014 0249   NITRITE NEGATIVE  04/09/2016 1245   LEUKOCYTESUR NEGATIVE 04/09/2016 1245   Radiological Exams on Admission: Dg Chest 2 View  Result Date: 04/09/2016 CLINICAL DATA:  Chest and abdominal pain, 3 days duration. Vomiting after meals. EXAM: CHEST  2 VIEW COMPARISON:  03/19/2015 FINDINGS: The heart is chronically enlarged. There is aortic atherosclerosis. The pulmonary vascularity is normal. The lungs are clear. No effusions. No bony abnormalities. IMPRESSION: Cardiomegaly.  Aortic atherosclerosis.  No active process otherwise. Electronically Signed   By: Nelson Chimes M.D.   On: 04/09/2016 10:08    EKG: Independently reviewed. A fib  Assessment/Plan Active Problems:   Diabetes mellitus (HCC)   Morbid obesity (HCC)   Atrial fibrillation (HCC)   OSA (obstructive sleep apnea)   Chest pain   Chest pain - will admit patient to the hospital for chest pain rule out, initial troponin is negative, EKG looks with A. Fib, however nonischemic,cycle cardiac enzymes overnight - CT angio per EDP pending - consulted cardiology, discussed with Dr. Terrence Dupont, appreciate input. He will see patient in the morning. She underwent a stress test in 2016 which showed depressed ejection fraction to 30-40%, intermediate risk finding. We'll make patient nothing by mouth after midnight in case cardiology wants to do further testing in the morning.  Atrial fibrillation - patient's CHA2DS2-VASc Score for Stroke Risk is 4, continue Pradaxa - rate controlled on the monitor, occasionally going as low as mid 30s, asymptomatic - decrease Coreg, monitor on telemetry overnight  DM - check A1C - home long acting insulin + SSI  OSA - CPAP  Anxiety - continue home medications  Abdominal pain - CT pending  Black stools - did show FOBT positive however patient's Hb is normal, she is not having active bleeding, monitor CBC for now - will eventually benefit from GI evaluation since she is on Pradaxa, unless CBC drops significantly this  could be done as an outpatient - most recent colonoscopy 2016 by [name] with polyp removal  CKD III - Cr  at baseline, monitor   DVT prophylaxis: on Pradaxa  Code Status: Full  Family Communication: d/w daughter bedside Disposition Plan: admit to telemetry Consults called: cardiology, Dr. Terrence Dupont  Admission status: obs    Marzetta Board, MD [facility] Pager [phone]  If 7PM-7AM, please contact night-coverage [email or web address] Password [facility]  04/09/2016, 4:51 PM

## 2016-04-09 NOTE — ED Triage Notes (Signed)
Per EMS- pt c.o. 3 days of following symptoms: Centralized chest pain, left arm pain with decreased sensation, shortness of breath, nausea vomiting and diarrhea, generalized abdominal pain, dizziness. Pt CBG 209. PIV 20G to Left AC placed by EMS. 324 ASA given by EMS. Nitro X 3 given with no change to the pain. 4mg  zofran given with no relief from nausea. Pain to chest 8/10. Pain to stomach 7/10. Pain to left arm 7/10. Pt has had 3 episodes of vomiting and diarrhea in the past 24 hours. Pt reports her diarrhea is dark.

## 2016-04-09 NOTE — ED Notes (Signed)
Attempted Report 

## 2016-04-10 ENCOUNTER — Observation Stay (HOSPITAL_COMMUNITY): Payer: Medicare HMO

## 2016-04-10 DIAGNOSIS — R0789 Other chest pain: Secondary | ICD-10-CM | POA: Diagnosis not present

## 2016-04-10 DIAGNOSIS — G4733 Obstructive sleep apnea (adult) (pediatric): Secondary | ICD-10-CM | POA: Diagnosis not present

## 2016-04-10 DIAGNOSIS — R112 Nausea with vomiting, unspecified: Secondary | ICD-10-CM

## 2016-04-10 DIAGNOSIS — R197 Diarrhea, unspecified: Secondary | ICD-10-CM | POA: Diagnosis not present

## 2016-04-10 DIAGNOSIS — E661 Drug-induced obesity: Secondary | ICD-10-CM

## 2016-04-10 LAB — CBC
HCT: 38 % (ref 36.0–46.0)
Hemoglobin: 11.9 g/dL — ABNORMAL LOW (ref 12.0–15.0)
MCH: 28.7 pg (ref 26.0–34.0)
MCHC: 31.3 g/dL (ref 30.0–36.0)
MCV: 91.8 fL (ref 78.0–100.0)
Platelets: 200 10*3/uL (ref 150–400)
RBC: 4.14 MIL/uL (ref 3.87–5.11)
RDW: 13.1 % (ref 11.5–15.5)
WBC: 7.8 10*3/uL (ref 4.0–10.5)

## 2016-04-10 LAB — COMPREHENSIVE METABOLIC PANEL
ALT: 13 U/L — ABNORMAL LOW (ref 14–54)
AST: 16 U/L (ref 15–41)
Albumin: 2.9 g/dL — ABNORMAL LOW (ref 3.5–5.0)
Alkaline Phosphatase: 59 U/L (ref 38–126)
Anion gap: 7 (ref 5–15)
BUN: 8 mg/dL (ref 6–20)
CO2: 26 mmol/L (ref 22–32)
Calcium: 8.1 mg/dL — ABNORMAL LOW (ref 8.9–10.3)
Chloride: 106 mmol/L (ref 101–111)
Creatinine, Ser: 1.32 mg/dL — ABNORMAL HIGH (ref 0.44–1.00)
GFR calc Af Amer: 50 mL/min — ABNORMAL LOW (ref >60)
GFR calc non Af Amer: 43 mL/min — ABNORMAL LOW (ref >60)
Glucose, Bld: 218 mg/dL — ABNORMAL HIGH (ref 65–99)
Potassium: 3.7 mmol/L (ref 3.5–5.1)
Sodium: 139 mmol/L (ref 135–145)
Total Bilirubin: 0.3 mg/dL (ref 0.3–1.2)
Total Protein: 6.5 g/dL (ref 6.5–8.1)

## 2016-04-10 LAB — GLUCOSE, CAPILLARY
Glucose-Capillary: 240 mg/dL — ABNORMAL HIGH (ref 65–99)
Glucose-Capillary: 240 mg/dL — ABNORMAL HIGH (ref 65–99)
Glucose-Capillary: 265 mg/dL — ABNORMAL HIGH (ref 65–99)
Glucose-Capillary: 212 mg/dL — ABNORMAL HIGH (ref 65–99)

## 2016-04-10 LAB — LIPID PANEL
Cholesterol: 89 mg/dL (ref 0–200)
HDL: 28 mg/dL — ABNORMAL LOW (ref >40)
LDL Cholesterol: 45 mg/dL (ref 0–99)
Total CHOL/HDL Ratio: 3.2 RATIO
Triglycerides: 78 mg/dL (ref <150)
VLDL: 16 mg/dL (ref 0–40)

## 2016-04-10 LAB — HEPARIN LEVEL (UNFRACTIONATED): Heparin Unfractionated: 0.18 IU/mL — ABNORMAL LOW (ref 0.30–0.70)

## 2016-04-10 LAB — TROPONIN I
Troponin I: <0.03 ng/mL (ref <0.03)
Troponin I: <0.03 ng/mL (ref <0.03)

## 2016-04-10 MED ORDER — REGADENOSON 0.4 MG/5ML IV SOLN
0.4000 mg | Freq: Once | INTRAVENOUS | Status: AC
  Administered 2016-04-10: 0.4 mg via INTRAVENOUS

## 2016-04-10 MED ORDER — REGADENOSON 0.4 MG/5ML IV SOLN
INTRAVENOUS | Status: AC
  Filled 2016-04-10: qty 5

## 2016-04-10 MED ORDER — MAGNESIUM SULFATE 50 % IJ SOLN
3.0000 g | Freq: Once | INTRAVENOUS | Status: AC
  Administered 2016-04-10: 3 g via INTRAVENOUS
  Filled 2016-04-10: qty 6

## 2016-04-10 MED ORDER — HEPARIN BOLUS VIA INFUSION
4000.0000 [IU] | Freq: Once | INTRAVENOUS | Status: AC
  Administered 2016-04-10: 4000 [IU] via INTRAVENOUS
  Filled 2016-04-10: qty 4000

## 2016-04-10 MED ORDER — HEPARIN BOLUS VIA INFUSION
4000.0000 [IU] | Freq: Once | INTRAVENOUS | Status: DC
  Filled 2016-04-10: qty 4000

## 2016-04-10 MED ORDER — HEPARIN (PORCINE) IN NACL 100-0.45 UNIT/ML-% IJ SOLN
1900.0000 [IU]/h | INTRAMUSCULAR | Status: DC
  Administered 2016-04-11: 1900 [IU]/h via INTRAVENOUS
  Filled 2016-04-10 (×2): qty 250

## 2016-04-10 MED ORDER — HEPARIN (PORCINE) IN NACL 100-0.45 UNIT/ML-% IJ SOLN: 16.0000 [IU]/kg/h | INTRAMUSCULAR | Status: DC

## 2016-04-10 MED ORDER — PANTOPRAZOLE SODIUM 40 MG PO TBEC
40.0000 mg | DELAYED_RELEASE_TABLET | Freq: Every day | ORAL | Status: DC
  Administered 2016-04-10 – 2016-04-12 (×2): 40 mg via ORAL
  Filled 2016-04-10 (×2): qty 1

## 2016-04-10 MED ORDER — HEPARIN (PORCINE) IN NACL 100-0.45 UNIT/ML-% IJ SOLN
16.0000 [IU]/kg/h | INTRAMUSCULAR | Status: DC
  Administered 2016-04-10: 16 [IU]/kg/h via INTRAVENOUS
  Filled 2016-04-10: qty 250

## 2016-04-10 NOTE — Consult Note (Signed)
Reason for Consult:chest pain Referring Physician: [facility]  [name] is an 59 y.o. female.  HPI: the patient is a 59-year-old female with past medical history significant for hypertension, and diabetes mellitus, chronic atrial fibrillation, COPD, obstructive sleep apnea/obesity hypoventilation syndrome, history of congestive heart failure secondary to systolic/diastolic dysfunction, chronic kidney disease, history of DVT in the past, depression, degenerative joint disease, morbid obesity, was admitted yesterday because of retrosternal chest pain described as pressure, as if someone is sitting on the chest associated with shortness of breath.  Patient also gives a history of exertional dyspnea and lately feeling tired and fatigued.  Also complaining of vague abdominal pain associated with diarrhea.  Denies any fever or chills.  3 sets of troponin I has been negative.  EKG done in the ER showed A. Fib with controlled ventricular response, poor R-wave progression.  No new acute ischemic changes were noted.  Patient had nuclear stress test last year which showed no evidence of ischemia, although in depressed LV systolic function.  Past Medical History:  Diagnosis Date  . Allergic rhinitis   . Arthritis   . Asthma   . Chronic diastolic CHF (congestive heart failure) (HCC)   . COPD (chronic obstructive pulmonary disease) (HCC)   . Depression   . DM (diabetes mellitus) (HCC)   . DVT (deep vein thrombosis) in pregnancy   . Dysrhythmia    atrial fibrilation  . GERD (gastroesophageal reflux disease)   . Headache(784.0)   . HTN (hypertension)   . Hyperlipidemia   . Obesity   . Shortness of breath   . Sleep apnea    compliant with CPAP    Past Surgical History:  Procedure Laterality Date  . ABDOMINAL HYSTERECTOMY    . CHOLECYSTECTOMY    . COLONOSCOPY WITH PROPOFOL N/A 03/05/2015   Procedure: COLONOSCOPY WITH PROPOFOL;  Surgeon: Patrick Hung, MD;  Location: [facility];   Service: Endoscopy;  Laterality: N/A;  . DIAGNOSTIC LAPAROSCOPY    . ingrown hallux Left   . KNEE SURGERY      Family History  Problem Relation Age of Onset  . Cancer Mother   . Hypertension Mother   . Cancer Father   . CAD    . Hypertension Sister   . Hypertension Brother     Social History:  reports that she has never smoked. She has never used smokeless tobacco. She reports that she does not drink alcohol or use drugs.  Allergies:  Allergies  Allergen Reactions  . Nsaids Other (See Comments)    On warfarin  . Sulfa Antibiotics Itching    Medications: I have reviewed the patient's current medications.  Results for orders placed or performed during the hospital encounter of 04/09/16 (from the past 48 hour(s))  Type and screen [facility]     Status: None   Collection Time: 04/09/16 10:18 AM  Result Value Ref Range   ABO/RH(D) A POS    Antibody Screen NEG    Sample Expiration 04/12/2016   ABO/Rh     Status: None   Collection Time: 04/09/16 10:18 AM  Result Value Ref Range   ABO/RH(D) A POS   I-stat troponin, ED     Status: None   Collection Time: 04/09/16 10:25 AM  Result Value Ref Range   Troponin i, poc 0.00 0.00 - 0.08 ng/mL   Comment 3            Comment: Due to the release kinetics of cTnI, a negative result   within the first hours of the onset of symptoms does not rule out myocardial infarction with certainty. If myocardial infarction is still suspected, repeat the test at appropriate intervals.   I-Stat CG4 Lactic Acid, ED     Status: None   Collection Time: 04/09/16 10:27 AM  Result Value Ref Range   Lactic Acid, Venous 1.73 0.5 - 1.9 mmol/L  POC occult blood, ED     Status: Abnormal   Collection Time: 04/09/16 10:31 AM  Result Value Ref Range   Fecal Occult Bld POSITIVE (A) NEGATIVE  CBC with Differential     Status: Abnormal   Collection Time: 04/09/16 10:33 AM  Result Value Ref Range   WBC 12.2 (H) 4.0 - 10.5 K/uL   RBC 4.48  3.87 - 5.11 MIL/uL   Hemoglobin 13.2 12.0 - 15.0 g/dL   HCT 41.1 36.0 - 46.0 %   MCV 91.7 78.0 - 100.0 fL   MCH 29.5 26.0 - 34.0 pg   MCHC 32.1 30.0 - 36.0 g/dL   RDW 13.1 11.5 - 15.5 %   Platelets 214 150 - 400 K/uL   Neutrophils Relative % 70 %   Neutro Abs 8.5 (H) 1.7 - 7.7 K/uL   Lymphocytes Relative 18 %   Lymphs Abs 2.2 0.7 - 4.0 K/uL   Monocytes Relative 10 %   Monocytes Absolute 1.2 (H) 0.1 - 1.0 K/uL   Eosinophils Relative 2 %   Eosinophils Absolute 0.3 0.0 - 0.7 K/uL   Basophils Relative 0 %   Basophils Absolute 0.0 0.0 - 0.1 K/uL  Comprehensive metabolic panel     Status: Abnormal   Collection Time: 04/09/16 10:33 AM  Result Value Ref Range   Sodium 139 135 - 145 mmol/L   Potassium 4.4 3.5 - 5.1 mmol/L   Chloride 106 101 - 111 mmol/L   CO2 22 22 - 32 mmol/L   Glucose, Bld 229 (H) 65 - 99 mg/dL   BUN 11 6 - 20 mg/dL   Creatinine, Ser 1.41 (H) 0.44 - 1.00 mg/dL   Calcium 8.7 (L) 8.9 - 10.3 mg/dL   Total Protein 7.6 6.5 - 8.1 g/dL   Albumin 3.3 (L) 3.5 - 5.0 g/dL   AST 16 15 - 41 U/L   ALT 13 (L) 14 - 54 U/L   Alkaline Phosphatase 72 38 - 126 U/L   Total Bilirubin 0.4 0.3 - 1.2 mg/dL   GFR calc non Af Amer 40 (L) >60 mL/min   GFR calc Af Amer 46 (L) >60 mL/min    Comment: (NOTE) The eGFR has been calculated using the CKD EPI equation. This calculation has not been validated in all clinical situations. eGFR's persistently <60 mL/min signify possible Chronic Kidney Disease.    Anion gap 11 5 - 15  Lipase, blood     Status: None   Collection Time: 04/09/16 10:42 AM  Result Value Ref Range   Lipase 18 11 - 51 U/L  Digoxin level     Status: None   Collection Time: 04/09/16 10:42 AM  Result Value Ref Range   Digoxin Level 1.1 0.8 - 2.0 ng/mL  Magnesium     Status: Abnormal   Collection Time: 04/09/16 10:42 AM  Result Value Ref Range   Magnesium 1.3 (L) 1.7 - 2.4 mg/dL  Brain natriuretic peptide     Status: None   Collection Time: 04/09/16 10:43 AM  Result  Value Ref Range   B Natriuretic Peptide 58.2 0.0 - 100.0 pg/mL  Urinalysis,  Routine w reflex microscopic (not at [facility])     Status: Abnormal   Collection Time: 04/09/16 12:45 PM  Result Value Ref Range   Color, Urine YELLOW YELLOW   APPearance HAZY (A) CLEAR   Specific Gravity, Urine 1.020 1.005 - 1.030   pH 5.5 5.0 - 8.0   Glucose, UA NEGATIVE NEGATIVE mg/dL   Hgb urine dipstick NEGATIVE NEGATIVE   Bilirubin Urine NEGATIVE NEGATIVE   Ketones, ur NEGATIVE NEGATIVE mg/dL   Protein, ur NEGATIVE NEGATIVE mg/dL   Nitrite NEGATIVE NEGATIVE   Leukocytes, UA NEGATIVE NEGATIVE    Comment: MICROSCOPIC NOT DONE ON URINES WITH NEGATIVE PROTEIN, BLOOD, LEUKOCYTES, NITRITE, OR GLUCOSE <1000 mg/dL.  I-Stat CG4 Lactic Acid, ED     Status: None   Collection Time: 04/09/16  1:43 PM  Result Value Ref Range   Lactic Acid, Venous 1.81 0.5 - 1.9 mmol/L  I-stat troponin, ED     Status: None   Collection Time: 04/09/16  2:36 PM  Result Value Ref Range   Troponin i, poc 0.00 0.00 - 0.08 ng/mL   Comment 3            Comment: Due to the release kinetics of cTnI, a negative result within the first hours of the onset of symptoms does not rule out myocardial infarction with certainty. If myocardial infarction is still suspected, repeat the test at appropriate intervals.   Troponin I     Status: None   Collection Time: 04/09/16  7:54 PM  Result Value Ref Range   Troponin I <0.03 <0.03 ng/mL  Glucose, capillary     Status: Abnormal   Collection Time: 04/09/16  9:02 PM  Result Value Ref Range   Glucose-Capillary 221 (H) 65 - 99 mg/dL  Troponin I     Status: None   Collection Time: 04/10/16  2:10 AM  Result Value Ref Range   Troponin I <0.03 <0.03 ng/mL  Glucose, capillary     Status: Abnormal   Collection Time: 04/10/16  7:29 AM  Result Value Ref Range   Glucose-Capillary 240 (H) 65 - 99 mg/dL   Comment 1 Notify RN    Comment 2 Document in Chart     Dg Chest 2 View  Result Date:  04/09/2016 CLINICAL DATA:  Chest and abdominal pain, 3 days duration. Vomiting after meals. EXAM: CHEST  2 VIEW COMPARISON:  03/19/2015 FINDINGS: The heart is chronically enlarged. There is aortic atherosclerosis. The pulmonary vascularity is normal. The lungs are clear. No effusions. No bony abnormalities. IMPRESSION: Cardiomegaly.  Aortic atherosclerosis.  No active process otherwise. Electronically Signed   By: Nelson Chimes M.D.   On: 04/09/2016 10:08   Ct Angio Chest/abd/pel For Dissection W And/or Wo Contrast  Result Date: 04/09/2016 CLINICAL DATA:  Chest and abdominal pain for 3 days EXAM: CT ANGIOGRAPHY CHEST, ABDOMEN AND PELVIS TECHNIQUE: Multidetector CT imaging through the chest, abdomen and pelvis was performed using the standard protocol during bolus administration of intravenous contrast. Multiplanar reconstructed images and MIPs were obtained and reviewed to evaluate the vascular anatomy. CONTRAST:  80 mL Isovue 370. COMPARISON:  None. FINDINGS: CTA CHEST FINDINGS Vascular: The thoracic aorta shows a truncus anomaly. No aneurysmal dilatation or dissection is identified. No significant calcific changes are seen. Pulmonary artery demonstrates a normal branching pattern without definitive pulmonary embolus. Cardiac structures are within normal limits. No right heart strain is noted. Nonvascular: No hilar or mediastinal adenopathy is seen. The thoracic inlet is within normal limits. The lungs are  well-aerated without focal infiltrate or sizable effusion. Degenerative changes of the thoracic spine are noted. Review of the MIP images confirms the above findings. CTA ABDOMEN AND PELVIS FINDINGS Vascular: The abdominal aorta shows no aneurysmal dilatation or dissection. A normal branching pattern is seen with the visceral vessels being widely patent. No significant atherosclerotic calcifications are seen. Single renal arteries are identified bilaterally. The left renal artery distally demonstrates  multifocal aneurysmal dilatation. Two areas are noted 1 more superiorly which measures 18 mm in greatest dimension 1 slightly more inferior measuring 16 mm in dimension. No obstruction of flow is seen. Mild thrombus is noted within the aneurysms. Nonvascular: The liver, spleen, adrenal glands and pancreas are within normal limits. The gallbladder has been surgically removed. The appendix is within normal limits. Fecal material is noted throughout the colon. No obstructive changes are noted. No free air or free fluid is noted within the pelvis. Uterus has been surgically removed. No acute bony abnormality is noted. Multilevel lumbar degenerative changes are seen. Mild thickening of the distal esophagus is noted which may represent esophagitis. Review of the MIP images confirms the above findings. IMPRESSION: CTA of the chest: Truncus anomaly of the thoracic aorta. No evidence of aneurysmal dilatation or dissection. 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 No aortic aneurysm or dissection is seen. Mild thickening of the distal esophagus which may represent esophagitis. Electronically Signed   By: Inez Catalina M.D.   On: 04/09/2016 18:31    Review of Systems  Constitutional: Negative for chills and fever.  Eyes: Negative for double vision.  Respiratory: Positive for shortness of breath.   Cardiovascular: Positive for chest pain. Negative for palpitations and orthopnea.  Gastrointestinal: Positive for abdominal pain and diarrhea.  Genitourinary: Negative for dysuria.  Neurological: Negative for dizziness and headaches.   Blood pressure 106/65, pulse 88, temperature 97.7 F (36.5 C), temperature source Oral, resp. rate 18, height 5' 6" (1.676 m), weight (!) 327 lb (148.3 kg), SpO2 98 %. Physical Exam  Constitutional: She is oriented to person, place, and time.  HENT:  Head: Normocephalic and atraumatic.  Eyes: Conjunctivae are normal. Pupils are equal,  round, and reactive to light. Left eye exhibits no discharge. No scleral icterus.  Neck: Neck supple. No thyromegaly present.  Cardiovascular:  Irregularly irregular, S1, S2, soft  Respiratory: Effort normal and breath sounds normal. No respiratory distress. She has no wheezes. She has no rales.  GI: Soft. Bowel sounds are normal. She exhibits distension. There is no tenderness. There is no rebound and no guarding.  Musculoskeletal: She exhibits deformity. She exhibits no edema or tenderness.  Neurological: She is alert and oriented to person, place, and time.    Assessment/Plan: Status post chest pain, some features worrisome for angina.  MI ruled out Compensated systolic/diastolic congestive heart failure. Chronic atrial fibrillation. Hypertension. Diabetes mellitus COPD Obstructive sleep apnea/obesity hypoventilation syndrome. Morbid obesity. Chronic kidney disease. History of DVT. Depression. Degenerative joint disease. Plan Agree with present management. We'll schedule for nuclear stress test. Hold Pradaxa for now and start heparin per pharmacy protocol and can restart once nuclear stress test negative for ischemia.   Charolette Forward 04/10/2016, 9:28 AM

## 2016-04-10 NOTE — Progress Notes (Signed)
ANTICOAGULATION CONSULT NOTE - Initial Consult  Pharmacy Consult for Heparin Indication: atrial fibrillation  Allergies  Allergen Reactions  . Nsaids Other (See Comments)    On warfarin  . Sulfa Antibiotics Itching    Patient Measurements: Height: 5\' 6"  (167.6 cm) Weight: (!) 327 lb (148.3 kg) IBW/kg (Calculated) : 59.3 Heparin Dosing Weight: 96.4 kg  Vital Signs: Temp: 97.9 F (36.6 C) (08/21 2123) Temp Source: Oral (08/21 2123) BP: 99/54 (08/21 2123) Pulse Rate: 94 (08/21 2123)  Labs:  Recent Labs  04/09/16 1033 04/09/16 1954 04/10/16 0210 04/10/16 0852 04/10/16 2130  HGB 13.2  --   --  11.9*  --   HCT 41.1  --   --  38.0  --   PLT 214  --   --  200  --   HEPARINUNFRC  --   --   --   --  0.18*  CREATININE 1.41*  --   --  1.32*  --   TROPONINI  --  <0.03 <0.03 <0.03  --     Estimated Creatinine Clearance: 68.7 mL/min (by C-G formula based on SCr of 1.32 mg/dL).  Assessment: 60 y.o morbidly obese female presented to [facility] on 04/09/16 with complaint of chest pain. She was taking Pradaxa (dabigatran) 75 mg BID prior to admission, last dose given on 8/20 @ 21:13 inpatient.  Pharmacy consulted today 04/10/16 to start IV heparin infusion for atrial fibrillation.   PMH noted as above which includes h/o afib and DVT during pregnancy. Hgb 13.2 > 11.9, pltc is within normal limits. No bleeding reported.  SCr = 1.41,  Estimated CrCl ~60ml/min (using adjusted body weight of 95 kg.  Appropriate to start parenteral anticoagulant 12 hours after last dabigatran dose when CrCl is >30 ml/min.   Heparin level = 0.18, subtherapeutic on 1500 units/hr, no issues with infusion, no bleeding noted per RN.  Goal of Therapy:  Heparin level 0.3-0.7 units/ml Monitor platelets by anticoagulation protocol: Yes    Plan:  Increase Heparin drip to 1700 unit/hr F/u AM labs   Thank you for allowing pharmacy to be part of this patients care team. Maryanna Shape, PharmD, BCPS  Clinical Pharmacist   Pager: [phone]    04/10/2016,10:01 PM

## 2016-04-10 NOTE — Care Management Obs Status (Signed)
[facility] NOTIFICATION   Patient Details  Name: [name] MRN: [id number] Date of Birth: 01/05/1956   Medicare Observation Status Notification Given:  Yes    Bethena Roys, RN 04/10/2016, 2:34 PM

## 2016-04-10 NOTE — Progress Notes (Signed)
Pt. Able to place cpap on herself when she is ready. Pt. 02 is connected and water chamber is filled appropriately.

## 2016-04-10 NOTE — Progress Notes (Signed)
ANTICOAGULATION CONSULT NOTE - Initial Consult  Pharmacy Consult for Heparin Indication: atrial fibrillation  Allergies  Allergen Reactions  . Nsaids Other (See Comments)    On warfarin  . Sulfa Antibiotics Itching    Patient Measurements: Height: 5\' 6"  (167.6 cm) Weight: (!) 327 lb (148.3 kg) IBW/kg (Calculated) : 59.3 Heparin Dosing Weight: 96.4 kg  Vital Signs: Temp: 97.7 F (36.5 C) (08/21 0544) Temp Source: Oral (08/21 0544) BP: 106/65 (08/21 0819) Pulse Rate: 88 (08/21 0819)  Labs:  Recent Labs  04/09/16 1033 04/09/16 1954 04/10/16 0210 04/10/16 0852  HGB 13.2  --   --  11.9*  HCT 41.1  --   --  38.0  PLT 214  --   --  200  CREATININE 1.41*  --   --   --   TROPONINI  --  <0.03 <0.03  --     Estimated Creatinine Clearance: 64.4 mL/min (by C-G formula based on SCr of 1.41 mg/dL).   Medical History: Past Medical History:  Diagnosis Date  . Allergic rhinitis   . Arthritis   . Asthma   . Chronic diastolic CHF (congestive heart failure) ([place])   . COPD (chronic obstructive pulmonary disease) ([place])   . Depression   . DM (diabetes mellitus) ([place])   . DVT (deep vein thrombosis) in pregnancy   . Dysrhythmia    atrial fibrilation  . GERD (gastroesophageal reflux disease)   . Headache(784.0)   . HTN (hypertension)   . Hyperlipidemia   . Obesity   . Shortness of breath   . Sleep apnea    compliant with CPAP    Medications:  Prescriptions Prior to Admission  Medication Sig Dispense Refill Last Dose  . camphor-menthol (ANTI-ITCH) lotion Apply 1 application topically as needed for itching.   04/08/2016 at Unknown time  . carvedilol (COREG) 12.5 MG tablet Take 12.5 mg by mouth 2 (two) times daily with a meal.   04/08/2016 at 2300  . clorazepate (TRANXENE) 7.5 MG tablet Take 7.5 mg by mouth daily.   04/08/2016 at Unknown time  . dabigatran (PRADAXA) 75 MG CAPS capsule Take 75 mg by mouth 2 (two) times daily.   04/08/2016 at Unknown time  . digoxin (LANOXIN)  0.25 MG tablet Take 0.5 tablets (0.125 mg total) by mouth daily. 15 tablet 0 04/08/2016 at Unknown time  . diphenhydrAMINE (BENADRYL) 25 MG tablet Take 25 mg by mouth every 4 (four) hours as needed for itching.   04/08/2016 at Unknown time  . furosemide (LASIX) 40 MG tablet Take 40 mg by mouth daily.    04/08/2016 at Unknown time  . gabapentin (NEURONTIN) 300 MG capsule Take 300 mg by mouth at bedtime.    04/08/2016 at Unknown time  . insulin aspart (NOVOLOG) 100 UNIT/ML injection Inject 5-10 Units into the skin every 6 (six) hours. 200-300 takes 5 units, 301-400 takes 7 units, 401-500 takes 12 units. Sliding Scale   04/08/2016 at Unknown time  . insulin detemir (LEVEMIR) 100 UNIT/ML injection Inject 5 Units into the skin 2 (two) times daily as needed (blood sugar over 200).    04/08/2016 at Unknown time  . isosorbide mononitrate (IMDUR) 30 MG 24 hr tablet Take 1 tablet (30 mg total) by mouth daily. 30 tablet 3 04/08/2016 at Unknown time  . losartan (COZAAR) 50 MG tablet Take 50 mg by mouth daily with breakfast.    04/08/2016 at Unknown time  . Multiple Vitamins-Minerals (MULTIVITAMIN WOMEN PO) Take 1 tablet by mouth daily.  04/08/2016 at Unknown time  . nitroGLYCERIN (NITROSTAT) 0.4 MG SL tablet Place 1 tablet (0.4 mg total) under the tongue every 5 (five) minutes x 3 doses as needed for chest pain. 25 tablet 12 04/09/2016 at Unknown time  . omeprazole (PRILOSEC) 40 MG capsule Take 40 mg by mouth every morning.    04/08/2016 at Unknown time  . PATANOL 0.1 % ophthalmic solution Place 1 drop into both eyes 2 (two) times daily.    04/08/2016 at Unknown time  . potassium chloride SA (K-DUR,KLOR-CON) 20 MEQ tablet Take 20 mEq by mouth 2 (two) times daily.    04/08/2016 at Unknown time  . PROAIR HFA 108 (90 BASE) MCG/ACT inhaler Inhale 2 puffs into the lungs every 4 (four) hours as needed for wheezing (wheezing).    Past Week at Unknown time  . rosuvastatin (CRESTOR) 10 MG tablet Take 10 mg by mouth daily with  breakfast.   04/08/2016 at Unknown time  . sitaGLIPtin (JANUVIA) 50 MG tablet Take 50 mg by mouth daily with breakfast.   04/08/2016 at Unknown time  . spironolactone (ALDACTONE) 25 MG tablet Take 25 mg by mouth daily.    04/08/2016 at Unknown time   Scheduled:  . aspirin  324 mg Oral Once  . carvedilol  3.125 mg Oral BID WC  . clorazepate  7.5 mg Oral Daily  . digoxin  0.125 mg Oral Daily  . furosemide  40 mg Oral Daily  . gabapentin  300 mg Oral QHS  . insulin aspart  0-5 Units Subcutaneous QHS  . insulin aspart  0-9 Units Subcutaneous TID WC  . insulin detemir  5 Units Subcutaneous QHS  . isosorbide mononitrate  30 mg Oral Daily  . magnesium sulfate 1 - 4 g bolus IVPB  3 g Intravenous Once  . multivitamin with minerals  1 tablet Oral Daily  . pantoprazole  40 mg Oral Daily  . rosuvastatin  10 mg Oral Q breakfast  . sodium chloride flush  3 mL Intravenous Q12H  . spironolactone  25 mg Oral Daily    Assessment: 60 y.o morbidly obese female presented to [facility] on 04/09/16 with complaint of chest pain. She was taking Pradaxa (dabigatran) 75 mg BID prior to admission, last dose given on 8/20 @ 21:13 inpatient.  Pharmacy consulted today 04/10/16 to start IV heparin infusion for atrial fibrillation.   PMH noted as above which includes h/o afib and DVT during pregnancy. Hgb 13.2 > 11.9, pltc is within normal limits. No bleeding reported.  SCr = 1.41,  Estimated CrCl ~17ml/min (using adjusted body weight of 95 kg.  Appropriate to start parenteral anticoagulant 12 hours after last dabigatran dose when CrCl is >30 ml/min.   Goal of Therapy:  Heparin level 0.3-0.7 units/ml Monitor platelets by anticoagulation protocol: Yes    Plan:  Heparin bolus 4000 units IV x1 Heparin drip at 1500 unit/hr Check heparin level 6 hours after start of heparin  Daily heparin level and CBC.   Thank you for allowing pharmacy to be part of this patients care team. Nicole Cella, RPh Clinical Pharmacist Pager:  [phone]  04/10/2016,9:32 AM

## 2016-04-10 NOTE — Progress Notes (Signed)
[facility] PROGRESS NOTE  [name] [id number] DOB: 12-20-1955 DOA: 04/09/2016   PCP: Patricia Nettle, MD     Assessment/Plan: Active Problems:   Diabetes mellitus ([place])   Morbid obesity ([place])   Atrial fibrillation ([facility])   OSA (obstructive sleep apnea)   Chest pain   Nausea vomiting and diarrhea   [name] is a 60 y.o. female with medical history significant of chronic combined systolic and diastolic heart failure, COPD, diabetes, atrial fibrillation, hypertension, hyperlipidemia, morbid obesity, obstructive sleep apnea, presents to the emergency room with a chief complaint of chest pain. Patient tells me that she's been having chest pain almost continuously for the past 3 days,  And feels like an elephant is sitting on her chest.  She is also complaining of shortness of breath associated with her chest pain.She is complaining of abdominal pain, nausea no vomiting however is having diarrhea. She complains of dark stools over the last several days.She has lightheadedness and dizziness.She complains of feeling very anxious over the last few days as well, and she does appear quite anxious in the emergency room. In the ED, initial troponin was negative, EKG shows A. Fib, her creatinine is elevated however it appears to be at baseline,she will undergo a CT angiogram of the chest per EDP and TRH was asked for admission for chest pain rule out  Assessment and plan  Chest pain-multiple cardiovascular risk factors, low EF, uncontrolled diabetes Cardiac enzymes negative 2, EKG looks with A. Fib, however nonischemic,- CT angio did not show any dissection, mild thickening of the distal esophagus  - consulted cardiology, discussed with Dr. Terrence Dupont, appreciate input. He will see patient today. She underwent a stress test in 2016 which showed depressed ejection fraction to 30-40%, intermediate risk finding. According to the patient the patient has been told that she  will receive a stress test today Cardiac enzymes negative 3  Atrial fibrillation - patient's CHA2DS2-VASc Score for Stroke Risk is 4, continue Pradaxa - rate controlled on the monitor, occasionally going as low as mid 30s, asymptomatic - decrease Coreg, monitor on telemetry overnight  Hypomagnesemia-replete  DM - check A1C, lipid panel shows LDL of 45, triglycerides 78, start patient on a statin - home long acting insulin + SSI  OSA - CPAP  Anxiety - continue home medications  Abdominal pain - CT pending  Black stools - did show FOBT positive however patient's Hb is normal, she is not having active bleeding, monitor CBC for now - will eventually benefit from GI evaluation since she is on Pradaxa, unless CBC drops significantly this could be done as an outpatient - most recent colonoscopy 2016 by [name] with polyp removal  CKD III, baseline around 1.4  - Cr at baseline, monitor    DVT prophylaxsis Pradaxa  Code Status:  Full code     Family Communication: Discussed in detail with the patient, all imaging results, lab results explained to the patient   Disposition Plan:  Cardiology consult      Consultants:  Cardiology  Procedures:  2-D echo  Antibiotics: Anti-infectives    None         HPI/Subjective: Patient's chest pain is reproducible with palpation of her chest, having active chest pain laying in bed  Objective: Vitals:   04/09/16 1944 04/09/16 1956 04/10/16 0544 04/10/16 0819  BP:  131/70 106/65 106/65  Pulse:  75 94 88  Resp:  18 18   Temp:  98 F (36.7 C)  97.7 F (36.5 C)   TempSrc:  Oral Oral   SpO2:  94% 98%   Weight: (!) 147.8 kg (325 lb 12.8 oz)  (!) 148.3 kg (327 lb)   Height: 5\' 6"  (1.676 m)       Intake/Output Summary (Last 24 hours) at 04/10/16 [id number] Last data filed at 04/09/16 2300  Gross per 24 hour  Intake              400 ml  Output              650 ml  Net             -250 ml     Exam:  Examination:  General exam: Appears calm and comfortable  Respiratory system: Clear to auscultation. Respiratory effort normal. Cardiovascular system: S1 & S2 heard, RRR. No JVD, murmurs, rubs, gallops or clicks. No pedal edema. Gastrointestinal system: Abdomen is nondistended, soft and nontender. No organomegaly or masses felt. Normal bowel sounds heard. Central nervous system: Alert and oriented. No focal neurological deficits. Extremities: Symmetric 5 x 5 power. Skin: No rashes, lesions or ulcers Psychiatry: Judgement and insight appear normal. Mood & affect appropriate.     Data Reviewed: I have personally reviewed following labs and imaging studies  Micro Results No results found for this or any previous visit (from the past 240 hour(s)).  Radiology Reports Dg Chest 2 View  Result Date: 04/09/2016 CLINICAL DATA:  Chest and abdominal pain, 3 days duration. Vomiting after meals. EXAM: CHEST  2 VIEW COMPARISON:  03/19/2015 FINDINGS: The heart is chronically enlarged. There is aortic atherosclerosis. The pulmonary vascularity is normal. The lungs are clear. No effusions. No bony abnormalities. IMPRESSION: Cardiomegaly.  Aortic atherosclerosis.  No active process otherwise. Electronically Signed   By: Nelson Chimes M.D.   On: 04/09/2016 10:08   Ct Angio Chest/abd/pel For Dissection W And/or Wo Contrast  Result Date: 04/09/2016 CLINICAL DATA:  Chest and abdominal pain for 3 days EXAM: CT ANGIOGRAPHY CHEST, ABDOMEN AND PELVIS TECHNIQUE: Multidetector CT imaging through the chest, abdomen and pelvis was performed using the standard protocol during bolus administration of intravenous contrast. Multiplanar reconstructed images and MIPs were obtained and reviewed to evaluate the vascular anatomy. CONTRAST:  80 mL Isovue 370. COMPARISON:  None. FINDINGS: CTA CHEST FINDINGS Vascular: The thoracic aorta shows a truncus anomaly. No aneurysmal dilatation or dissection is identified. No  significant calcific changes are seen. Pulmonary artery demonstrates a normal branching pattern without definitive pulmonary embolus. Cardiac structures are within normal limits. No right heart strain is noted. Nonvascular: No hilar or mediastinal adenopathy is seen. The thoracic inlet is within normal limits. The lungs are well-aerated without focal infiltrate or sizable effusion. Degenerative changes of the thoracic spine are noted. Review of the MIP images confirms the above findings. CTA ABDOMEN AND PELVIS FINDINGS Vascular: The abdominal aorta shows no aneurysmal dilatation or dissection. A normal branching pattern is seen with the visceral vessels being widely patent. No significant atherosclerotic calcifications are seen. Single renal arteries are identified bilaterally. The left renal artery distally demonstrates multifocal aneurysmal dilatation. Two areas are noted 1 more superiorly which measures 18 mm in greatest dimension 1 slightly more inferior measuring 16 mm in dimension. No obstruction of flow is seen. Mild thrombus is noted within the aneurysms. Nonvascular: The liver, spleen, adrenal glands and pancreas are within normal limits. The gallbladder has been surgically removed. The appendix is within normal limits. Fecal material is noted throughout the colon. No  obstructive changes are noted. No free air or free fluid is noted within the pelvis. Uterus has been surgically removed. No acute bony abnormality is noted. Multilevel lumbar degenerative changes are seen. Mild thickening of the distal esophagus is noted which may represent esophagitis. Review of the MIP images confirms the above findings. IMPRESSION: CTA of the chest: Truncus anomaly of the thoracic aorta. No evidence of aneurysmal dilatation or dissection. 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 No aortic aneurysm or dissection is seen. Mild thickening of the distal esophagus which may  represent esophagitis. Electronically Signed   By: Inez Catalina M.D.   On: 04/09/2016 18:31     CBC  Recent Labs Lab 04/09/16 1033  WBC 12.2*  HGB 13.2  HCT 41.1  PLT 214  MCV 91.7  MCH 29.5  MCHC 32.1  RDW 13.1  LYMPHSABS 2.2  MONOABS 1.2*  EOSABS 0.3  BASOSABS 0.0    Chemistries   Recent Labs Lab 04/09/16 1033 04/09/16 1042  NA 139  --   K 4.4  --   CL 106  --   CO2 22  --   GLUCOSE 229*  --   BUN 11  --   CREATININE 1.41*  --   CALCIUM 8.7*  --   MG  --  1.3*  AST 16  --   ALT 13*  --   ALKPHOS 72  --   BILITOT 0.4  --    ------------------------------------------------------------------------------------------------------------------ estimated creatinine clearance is 64.4 mL/min (by C-G formula based on SCr of 1.41 mg/dL). ------------------------------------------------------------------------------------------------------------------ No results for input(s): HGBA1C in the last 72 hours. ------------------------------------------------------------------------------------------------------------------ No results for input(s): CHOL, HDL, LDLCALC, TRIG, CHOLHDL, LDLDIRECT in the last 72 hours. ------------------------------------------------------------------------------------------------------------------ No results for input(s): TSH, T4TOTAL, T3FREE, THYROIDAB in the last 72 hours.  Invalid input(s): FREET3 ------------------------------------------------------------------------------------------------------------------ No results for input(s): VITAMINB12, FOLATE, FERRITIN, TIBC, IRON, RETICCTPCT in the last 72 hours.  Coagulation profile No results for input(s): INR, PROTIME in the last 168 hours.  No results for input(s): DDIMER in the last 72 hours.  Cardiac Enzymes  Recent Labs Lab 04/09/16 1954 04/10/16 0210  TROPONINI <0.03 <0.03    ------------------------------------------------------------------------------------------------------------------ Invalid input(s): POCBNP   CBG:  Recent Labs Lab 04/09/16 2102 04/10/16 0729  GLUCAP 221* 240*       Studies: Dg Chest 2 View  Result Date: 04/09/2016 CLINICAL DATA:  Chest and abdominal pain, 3 days duration. Vomiting after meals. EXAM: CHEST  2 VIEW COMPARISON:  03/19/2015 FINDINGS: The heart is chronically enlarged. There is aortic atherosclerosis. The pulmonary vascularity is normal. The lungs are clear. No effusions. No bony abnormalities. IMPRESSION: Cardiomegaly.  Aortic atherosclerosis.  No active process otherwise. Electronically Signed   By: Nelson Chimes M.D.   On: 04/09/2016 10:08   Ct Angio Chest/abd/pel For Dissection W And/or Wo Contrast  Result Date: 04/09/2016 CLINICAL DATA:  Chest and abdominal pain for 3 days EXAM: CT ANGIOGRAPHY CHEST, ABDOMEN AND PELVIS TECHNIQUE: Multidetector CT imaging through the chest, abdomen and pelvis was performed using the standard protocol during bolus administration of intravenous contrast. Multiplanar reconstructed images and MIPs were obtained and reviewed to evaluate the vascular anatomy. CONTRAST:  80 mL Isovue 370. COMPARISON:  None. FINDINGS: CTA CHEST FINDINGS Vascular: The thoracic aorta shows a truncus anomaly. No aneurysmal dilatation or dissection is identified. No significant calcific changes are seen. Pulmonary artery demonstrates a normal branching pattern without definitive pulmonary embolus. Cardiac structures are within normal limits.  No right heart strain is noted. Nonvascular: No hilar or mediastinal adenopathy is seen. The thoracic inlet is within normal limits. The lungs are well-aerated without focal infiltrate or sizable effusion. Degenerative changes of the thoracic spine are noted. Review of the MIP images confirms the above findings. CTA ABDOMEN AND PELVIS FINDINGS Vascular: The abdominal aorta shows no  aneurysmal dilatation or dissection. A normal branching pattern is seen with the visceral vessels being widely patent. No significant atherosclerotic calcifications are seen. Single renal arteries are identified bilaterally. The left renal artery distally demonstrates multifocal aneurysmal dilatation. Two areas are noted 1 more superiorly which measures 18 mm in greatest dimension 1 slightly more inferior measuring 16 mm in dimension. No obstruction of flow is seen. Mild thrombus is noted within the aneurysms. Nonvascular: The liver, spleen, adrenal glands and pancreas are within normal limits. The gallbladder has been surgically removed. The appendix is within normal limits. Fecal material is noted throughout the colon. No obstructive changes are noted. No free air or free fluid is noted within the pelvis. Uterus has been surgically removed. No acute bony abnormality is noted. Multilevel lumbar degenerative changes are seen. Mild thickening of the distal esophagus is noted which may represent esophagitis. Review of the MIP images confirms the above findings. IMPRESSION: CTA of the chest: Truncus anomaly of the thoracic aorta. No evidence of aneurysmal dilatation or dissection. 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 No aortic aneurysm or dissection is seen. Mild thickening of the distal esophagus which may represent esophagitis. Electronically Signed   By: Inez Catalina M.D.   On: 04/09/2016 18:31      Lab Results  Component Value Date   HGBA1C 13.5 (H) 03/19/2015   HGBA1C 7.3 (H) 01/07/2014   HGBA1C 7.3 (H) 06/20/2013   Lab Results  Component Value Date   LDLCALC 56 01/08/2014   CREATININE 1.41 (H) 04/09/2016       Scheduled Meds: . aspirin  324 mg Oral Once  . carvedilol  3.125 mg Oral BID WC  . clorazepate  7.5 mg Oral Daily  . dabigatran  75 mg Oral BID  . digoxin  0.125 mg Oral Daily  . furosemide  40 mg Oral Daily  . gabapentin  300  mg Oral QHS  . insulin aspart  0-5 Units Subcutaneous QHS  . insulin aspart  0-9 Units Subcutaneous TID WC  . insulin detemir  5 Units Subcutaneous QHS  . isosorbide mononitrate  30 mg Oral Daily  . magnesium sulfate 1 - 4 g bolus IVPB  3 g Intravenous Once  . multivitamin with minerals  1 tablet Oral Daily  . pantoprazole  40 mg Oral Daily  . rosuvastatin  10 mg Oral Q breakfast  . sodium chloride flush  3 mL Intravenous Q12H  . spironolactone  25 mg Oral Daily   Continuous Infusions:    LOS: 0 days    Time spent: >30 MINS    [facility]  [facility] Pager [phone]. If 7PM-7AM, please contact night-coverage at [email or web address], password [facility] 04/10/2016, 8:26 AM  LOS: 0 days

## 2016-04-10 NOTE — Progress Notes (Signed)
Inpatient Diabetes Program Recommendations  AACE/ADA: New Consensus Statement on Inpatient Glycemic Control (2015)  Target Ranges:  Prepandial:   less than 140 mg/dL      Peak postprandial:   less than 180 mg/dL (1-2 hours)      Critically ill patients:  140 - 180 mg/dL   Results for LYNZI, PHOEBUS (MRN [id number]) as of 04/10/2016 13:25  Ref. Range 04/09/2016 21:02 04/10/2016 07:29 04/10/2016 13:00  Glucose-Capillary Latest Ref Range: 65 - 99 mg/dL 221 (H) 240 (H) 240 (H)    Admit with: CP  History: DM, COPD, CKD  Home DM Meds: Levemir 5 units bid       Novolog 5-10 units QID       Januvia 50 mg daily  Current Insulin Orders: Levemir 5 units QHS      Novolog Sensitive Correction Scale/ SSI (0-9 units) TID AC + HS       -Current A1c pending.  -Glucose levels >200 mg/dl since admission.     MD- Please consider the following in-hospital insulin adjustments:  1. Increase Levemir to 5 units bid (home dose)  2. Increase Novolog SSI to Novolog Moderate Correction Scale/ SSI (0-15 units) TID AC + HS (currently ordered as Sensitive scale 0-9 units)      --Will follow patient during hospitalization--  Wyn Quaker RN, MSN, CDE Diabetes Coordinator Inpatient Glycemic Control Team Team Pager: [phone] (8a-5p)

## 2016-04-11 ENCOUNTER — Observation Stay (HOSPITAL_COMMUNITY): Payer: Medicare HMO

## 2016-04-11 DIAGNOSIS — R0789 Other chest pain: Secondary | ICD-10-CM | POA: Diagnosis not present

## 2016-04-11 DIAGNOSIS — R112 Nausea with vomiting, unspecified: Secondary | ICD-10-CM | POA: Diagnosis not present

## 2016-04-11 DIAGNOSIS — E661 Drug-induced obesity: Secondary | ICD-10-CM | POA: Diagnosis not present

## 2016-04-11 DIAGNOSIS — R197 Diarrhea, unspecified: Secondary | ICD-10-CM | POA: Diagnosis not present

## 2016-04-11 LAB — BASIC METABOLIC PANEL
Anion gap: 7 (ref 5–15)
BUN: 6 mg/dL (ref 6–20)
CO2: 27 mmol/L (ref 22–32)
Calcium: 8.5 mg/dL — ABNORMAL LOW (ref 8.9–10.3)
Chloride: 104 mmol/L (ref 101–111)
Creatinine, Ser: 1.21 mg/dL — ABNORMAL HIGH (ref 0.44–1.00)
GFR calc Af Amer: 56 mL/min — ABNORMAL LOW (ref >60)
GFR calc non Af Amer: 48 mL/min — ABNORMAL LOW (ref >60)
Glucose, Bld: 217 mg/dL — ABNORMAL HIGH (ref 65–99)
Potassium: 3.9 mmol/L (ref 3.5–5.1)
Sodium: 138 mmol/L (ref 135–145)

## 2016-04-11 LAB — HEMOGLOBIN A1C
Hgb A1c MFr Bld: 11.2 % — ABNORMAL HIGH (ref 4.8–5.6)
Mean Plasma Glucose: 275 mg/dL

## 2016-04-11 LAB — COMPREHENSIVE METABOLIC PANEL
ALT: 13 U/L — ABNORMAL LOW (ref 14–54)
AST: 18 U/L (ref 15–41)
Albumin: 3.2 g/dL — ABNORMAL LOW (ref 3.5–5.0)
Alkaline Phosphatase: 69 U/L (ref 38–126)
Anion gap: 8 (ref 5–15)
BUN: <5 mg/dL — ABNORMAL LOW (ref 6–20)
CO2: 27 mmol/L (ref 22–32)
Calcium: 8.8 mg/dL — ABNORMAL LOW (ref 8.9–10.3)
Chloride: 99 mmol/L — ABNORMAL LOW (ref 101–111)
Creatinine, Ser: 1.24 mg/dL — ABNORMAL HIGH (ref 0.44–1.00)
GFR calc Af Amer: 54 mL/min — ABNORMAL LOW (ref >60)
GFR calc non Af Amer: 47 mL/min — ABNORMAL LOW (ref >60)
Glucose, Bld: 283 mg/dL — ABNORMAL HIGH (ref 65–99)
Potassium: 3.8 mmol/L (ref 3.5–5.1)
Sodium: 134 mmol/L — ABNORMAL LOW (ref 135–145)
Total Bilirubin: 0.4 mg/dL (ref 0.3–1.2)
Total Protein: 7.4 g/dL (ref 6.5–8.1)

## 2016-04-11 LAB — CBC
HCT: 36.5 % (ref 36.0–46.0)
Hemoglobin: 11.5 g/dL — ABNORMAL LOW (ref 12.0–15.0)
MCH: 28.8 pg (ref 26.0–34.0)
MCHC: 31.5 g/dL (ref 30.0–36.0)
MCV: 91.5 fL (ref 78.0–100.0)
Platelets: 196 10*3/uL (ref 150–400)
RBC: 3.99 MIL/uL (ref 3.87–5.11)
RDW: 13 % (ref 11.5–15.5)
WBC: 8.9 10*3/uL (ref 4.0–10.5)

## 2016-04-11 LAB — GLUCOSE, CAPILLARY
Glucose-Capillary: 191 mg/dL — ABNORMAL HIGH (ref 65–99)
Glucose-Capillary: 205 mg/dL — ABNORMAL HIGH (ref 65–99)
Glucose-Capillary: 165 mg/dL — ABNORMAL HIGH (ref 65–99)

## 2016-04-11 LAB — HEPARIN LEVEL (UNFRACTIONATED)
Heparin Unfractionated: 0.28 IU/mL — ABNORMAL LOW (ref 0.30–0.70)
Heparin Unfractionated: 0.49 IU/mL (ref 0.30–0.70)

## 2016-04-11 LAB — MAGNESIUM: Magnesium: 1.7 mg/dL (ref 1.7–2.4)

## 2016-04-11 MED ORDER — TECHNETIUM TC 99M TETROFOSMIN IV KIT
30.0000 | PACK | Freq: Once | INTRAVENOUS | Status: AC | PRN
  Administered 2016-04-11: 30 via INTRAVENOUS

## 2016-04-11 MED ORDER — INSULIN DETEMIR 100 UNIT/ML ~~LOC~~ SOLN
8.0000 [IU] | Freq: Every day | SUBCUTANEOUS | Status: DC
  Administered 2016-04-11: 8 [IU] via SUBCUTANEOUS
  Filled 2016-04-11 (×2): qty 0.08

## 2016-04-11 MED ORDER — INSULIN GLARGINE 100 UNIT/ML ~~LOC~~ SOLN
5.0000 [IU] | Freq: Once | SUBCUTANEOUS | Status: DC
Start: 2016-04-11 — End: 2016-04-11
  Filled 2016-04-11: qty 0.05

## 2016-04-11 MED ORDER — INSULIN DETEMIR 100 UNIT/ML ~~LOC~~ SOLN
5.0000 [IU] | Freq: Every day | SUBCUTANEOUS | Status: DC
  Administered 2016-04-11 – 2016-04-12 (×2): 5 [IU] via SUBCUTANEOUS
  Filled 2016-04-11 (×2): qty 0.05

## 2016-04-11 MED ORDER — LORAZEPAM 2 MG/ML IJ SOLN: 1.0000 mg | Freq: Once | INTRAMUSCULAR | Status: DC

## 2016-04-11 NOTE — Progress Notes (Addendum)
ANTICOAGULATION CONSULT NOTE -   Pharmacy Consult for Heparin Indication: atrial fibrillation  Allergies  Allergen Reactions  . Nsaids Other (See Comments)    On warfarin  . Sulfa Antibiotics Itching    Patient Measurements: Height: 5\' 6"  (167.6 cm) Weight: (!) 332 lb 4.8 oz (150.7 kg) IBW/kg (Calculated) : 59.3 Heparin Dosing Weight: 96.4 kg  Vital Signs: Temp: 98.3 F (36.8 C) (08/22 1154) Temp Source: Oral (08/22 1154) BP: 117/58 (08/22 1633) Pulse Rate: 92 (08/22 1633)  Labs:  Recent Labs  04/09/16 1033 04/09/16 1954 04/10/16 0210 04/10/16 0852 04/10/16 2130 04/11/16 0408 04/11/16 1651  HGB 13.2  --   --  11.9*  --  11.5*  --   HCT 41.1  --   --  38.0  --  36.5  --   PLT 214  --   --  200  --  196  --   HEPARINUNFRC  --   --   --   --  0.18* 0.28* 0.49  CREATININE 1.41*  --   --  1.32*  --  1.21* 1.24*  TROPONINI  --  <0.03 <0.03 <0.03  --   --   --     Estimated Creatinine Clearance: 74 mL/min (by C-G formula based on SCr of 1.24 mg/dL).  Assessment: 60 y.o. female with chest pain, h/o Afib and Pradaxa on hold for heparin  Heparin level therapeutic = 0.49 on 1900 units/hr, stress test today, results pending.  Goal of Therapy:  Heparin level 0.3-0.7 units/ml Monitor platelets by anticoagulation protocol: Yes   Plan:  Continue heparin at current rate. F/u plans in AM  Maryanna Shape, PharmD, BCPS  Clinical Pharmacist  Pager: [phone]   04/11/2016,6:09 PM

## 2016-04-11 NOTE — Progress Notes (Addendum)
Subjective:  Continues to have vague retrosternal sharp and pressure chest pain localized without associated symptoms.  Objective:  Vital Signs in the last 24 hours: Temp:  [97.9 F (36.6 C)-98.5 F (36.9 C)] 98.5 F (36.9 C) (08/22 0529) Pulse Rate:  [81-104] 101 (08/22 0529) Resp:  [18-22] 18 (08/22 0529) BP: (96-119)/(54-65) 100/63 (08/22 0529) SpO2:  [92 %-100 %] 100 % (08/22 0529) Weight:  [332 lb 4.8 oz (150.7 kg)] 332 lb 4.8 oz (150.7 kg) (08/22 0529)  Intake/Output from previous day: 08/21 0701 - 08/22 0700 In: 6483 [P.O.:480; I.V.:6003] Out: 900 [Urine:900] Intake/Output from this shift: No intake/output data recorded.  Physical Exam: Exam unchanged  Lab Results:  Recent Labs  04/10/16 0852 04/11/16 0408  WBC 7.8 8.9  HGB 11.9* 11.5*  PLT 200 196    Recent Labs  04/10/16 0852 04/11/16 0408  NA 139 138  K 3.7 3.9  CL 106 104  CO2 26 27  GLUCOSE 218* 217*  BUN 8 6  CREATININE 1.32* 1.21*    Recent Labs  04/10/16 0210 04/10/16 0852  TROPONINI <0.03 <0.03   Hepatic Function Panel  Recent Labs  04/10/16 0852  PROT 6.5  ALBUMIN 2.9*  AST 16  ALT 13*  ALKPHOS 59  BILITOT 0.3    Recent Labs  04/10/16 0852  CHOL 89   No results for input(s): PROTIME in the last 72 hours.  Imaging: Imaging results have been reviewed and Dg Chest 2 View  Result Date: 04/09/2016 CLINICAL DATA:  Chest and abdominal pain, 3 days duration. Vomiting after meals. EXAM: CHEST  2 VIEW COMPARISON:  03/19/2015 FINDINGS: The heart is chronically enlarged. There is aortic atherosclerosis. The pulmonary vascularity is normal. The lungs are clear. No effusions. No bony abnormalities. IMPRESSION: Cardiomegaly.  Aortic atherosclerosis.  No active process otherwise. Electronically Signed   By: Nelson Chimes M.D.   On: 04/09/2016 10:08   Ct Angio Chest/abd/pel For Dissection W And/or Wo Contrast  Result Date: 04/09/2016 CLINICAL DATA:  Chest and abdominal pain for 3 days  EXAM: CT ANGIOGRAPHY CHEST, ABDOMEN AND PELVIS TECHNIQUE: Multidetector CT imaging through the chest, abdomen and pelvis was performed using the standard protocol during bolus administration of intravenous contrast. Multiplanar reconstructed images and MIPs were obtained and reviewed to evaluate the vascular anatomy. CONTRAST:  80 mL Isovue 370. COMPARISON:  None. FINDINGS: CTA CHEST FINDINGS Vascular: The thoracic aorta shows a truncus anomaly. No aneurysmal dilatation or dissection is identified. No significant calcific changes are seen. Pulmonary artery demonstrates a normal branching pattern without definitive pulmonary embolus. Cardiac structures are within normal limits. No right heart strain is noted. Nonvascular: No hilar or mediastinal adenopathy is seen. The thoracic inlet is within normal limits. The lungs are well-aerated without focal infiltrate or sizable effusion. Degenerative changes of the thoracic spine are noted. Review of the MIP images confirms the above findings. CTA ABDOMEN AND PELVIS FINDINGS Vascular: The abdominal aorta shows no aneurysmal dilatation or dissection. A normal branching pattern is seen with the visceral vessels being widely patent. No significant atherosclerotic calcifications are seen. Single renal arteries are identified bilaterally. The left renal artery distally demonstrates multifocal aneurysmal dilatation. Two areas are noted 1 more superiorly which measures 18 mm in greatest dimension 1 slightly more inferior measuring 16 mm in dimension. No obstruction of flow is seen. Mild thrombus is noted within the aneurysms. Nonvascular: The liver, spleen, adrenal glands and pancreas are within normal limits. The gallbladder has been surgically removed. The appendix is  within normal limits. Fecal material is noted throughout the colon. No obstructive changes are noted. No free air or free fluid is noted within the pelvis. Uterus has been surgically removed. No acute bony  abnormality is noted. Multilevel lumbar degenerative changes are seen. Mild thickening of the distal esophagus is noted which may represent esophagitis. Review of the MIP images confirms the above findings. IMPRESSION: CTA of the chest: Truncus anomaly of the thoracic aorta. No evidence of aneurysmal dilatation or dissection. 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 No aortic aneurysm or dissection is seen. Mild thickening of the distal esophagus which may represent esophagitis. Electronically Signed   By: Inez Catalina M.D.   On: 04/09/2016 18:31    Cardiac Studies:  Assessment/Plan:  Recurrent chest pain some features worrisome for angina.  MI ruled out nuclear stress test results pending Compensated systolic/diastolic congestive heart failure. Chronic atrial fibrillation. Hypertension. Diabetes mellitus COPD Obstructive sleep apnea/obesity hypoventilation syndrome. Morbid obesity. Chronic kidney disease. History of DVT. Depression. Degenerative joint disease. Plan Continue present management Check nuclear stress test results  LOS: 0 days    Charolette Forward 04/11/2016, 9:02 AM

## 2016-04-11 NOTE — Progress Notes (Signed)
[facility] PROGRESS NOTE  Mckelle Redford [id number] DOB: [birth year] DOA: 04/09/2016   PCP: Patricia Nettle, MD     Assessment/Plan: Active Problems:   Diabetes mellitus ([place])   Morbid obesity ([place])   Atrial fibrillation ([facility])   OSA (obstructive sleep apnea)   Chest pain   Nausea vomiting and diarrhea   [name] is a 59 y.o. female with medical history significant of chronic combined systolic and diastolic heart failure, COPD, diabetes, atrial fibrillation, hypertension, hyperlipidemia, morbid obesity, obstructive sleep apnea, presents to the emergency room with a chief complaint of chest pain. Patient tells me that she's been having chest pain almost continuously for the past 3 days,  And feels like an elephant is sitting on her chest.  She is also complaining of shortness of breath associated with her chest pain.She is complaining of abdominal pain, nausea no vomiting however is having diarrhea. She complains of dark stools over the last several days.She has lightheadedness and dizziness.She complains of feeling very anxious over the last few days as well, and she does appear quite anxious in the emergency room. In the ED, initial troponin was negative, EKG shows A. Fib, her creatinine is elevated however it appears to be at baseline,s/p  CT angiogram of the chest per EDP and [facility] was asked for admission for chest pain rule out  Assessment and plan  Chest pain-multiple cardiovascular risk factors, low EF, uncontrolled diabetes Cardiac enzymes negative 2, EKG looks with A. Fib, however nonischemic,- CT angio did not show any dissection, mild thickening of the distal esophagus  - consulted cardiology, discussed with Dr. Terrence Dupont, appreciate input. He will see patient today. She underwent a stress test in 2016 which showed depressed ejection fraction to 30-40%, intermediate risk finding.  stress test today, results pending  Cardiac enzymes negative 3 Also  has cervical radiculopathy like sx , needs MRI c-spine Also has GI sx , RUQ  USG ordered and pending   Atrial fibrillation - patient's CHA2DS2-VASc Score for Stroke Risk is 4, continue Pradaxa - rate controlled on the monitor, occasionally going as low as mid 30s, asymptomatic - decrease Coreg, monitor on telemetry overnight  Hypomagnesemia-replete  DM -  A1C 11.2 , lipid panel shows LDL of 45, triglycerides 78, start patient on a statin -Increase Levemir to 5 units bid  + SSI  OSA - CPAP  Anxiety - continue home medications  Abdominal pain - CT pending  Black stools - did show FOBT positive however patient's Hb is normal, she is not having active bleeding,   - will eventually benefit from GI evaluation since she is on Pradaxa,hg stable,  unless CBC drops significantly this could be done as an outpatient - most recent colonoscopy 2016 by [name] with polyp removal  CKD III, baseline around 1.4  - Cr at baseline, monitor    DVT prophylaxsis Pradaxa  Code Status:  Full code     Family Communication: Discussed in detail with the patient, all imaging results, lab results explained to the patient   Disposition Plan:  Cardiology consult      Consultants:  Cardiology  Procedures:  2-D echo  Antibiotics: Anti-infectives    None         HPI/Subjective: Patient's chest pain is reproducible with palpation of her chest, now also complaining of nausea , numbness and tingling b/l upper extremities  Objective: Vitals:   04/10/16 2230 04/11/16 0529 04/11/16 0820 04/11/16 1154  BP: 119/64 100/63 106/65 Marland Kitchen)  117/58  Pulse:  (!) 101  100  Resp:  18  18  Temp:  98.5 F (36.9 C)  98.3 F (36.8 C)  TempSrc:  Oral  Oral  SpO2:  100%  100%  Weight:  (!) 150.7 kg (332 lb 4.8 oz)    Height:        Intake/Output Summary (Last 24 hours) at 04/11/16 1614 Last data filed at 04/11/16 1527  Gross per 24 hour  Intake            14880 ml  Output              1300 ml  Net            13580 ml    Exam:  Examination:  General exam: Appears calm and comfortable  Respiratory system: Clear to auscultation. Respiratory effort normal. Cardiovascular system: S1 & S2 heard, RRR. No JVD, murmurs, rubs, gallops or clicks. No pedal edema. Gastrointestinal system: Abdomen is nondistended, soft and nontender. No organomegaly or masses felt. Normal bowel sounds heard. Central nervous system: Alert and oriented. No focal neurological deficits. Extremities: Symmetric 5 x 5 power. Skin: No rashes, lesions or ulcers Psychiatry: Judgement and insight appear normal. Mood & affect appropriate.     Data Reviewed: I have personally reviewed following labs and imaging studies  Micro Results No results found for this or any previous visit (from the past 240 hour(s)).  Radiology Reports Dg Chest 2 View  Result Date: 04/09/2016 CLINICAL DATA:  Chest and abdominal pain, 3 days duration. Vomiting after meals. EXAM: CHEST  2 VIEW COMPARISON:  03/19/2015 FINDINGS: The heart is chronically enlarged. There is aortic atherosclerosis. The pulmonary vascularity is normal. The lungs are clear. No effusions. No bony abnormalities. IMPRESSION: Cardiomegaly.  Aortic atherosclerosis.  No active process otherwise. Electronically Signed   By: Nelson Chimes M.D.   On: 04/09/2016 10:08   Ct Angio Chest/abd/pel For Dissection W And/or Wo Contrast  Result Date: 04/09/2016 CLINICAL DATA:  Chest and abdominal pain for 3 days EXAM: CT ANGIOGRAPHY CHEST, ABDOMEN AND PELVIS TECHNIQUE: Multidetector CT imaging through the chest, abdomen and pelvis was performed using the standard protocol during bolus administration of intravenous contrast. Multiplanar reconstructed images and MIPs were obtained and reviewed to evaluate the vascular anatomy. CONTRAST:  80 mL Isovue 370. COMPARISON:  None. FINDINGS: CTA CHEST FINDINGS Vascular: The thoracic aorta shows a truncus anomaly. No aneurysmal dilatation  or dissection is identified. No significant calcific changes are seen. Pulmonary artery demonstrates a normal branching pattern without definitive pulmonary embolus. Cardiac structures are within normal limits. No right heart strain is noted. Nonvascular: No hilar or mediastinal adenopathy is seen. The thoracic inlet is within normal limits. The lungs are well-aerated without focal infiltrate or sizable effusion. Degenerative changes of the thoracic spine are noted. Review of the MIP images confirms the above findings. CTA ABDOMEN AND PELVIS FINDINGS Vascular: The abdominal aorta shows no aneurysmal dilatation or dissection. A normal branching pattern is seen with the visceral vessels being widely patent. No significant atherosclerotic calcifications are seen. Single renal arteries are identified bilaterally. The left renal artery distally demonstrates multifocal aneurysmal dilatation. Two areas are noted 1 more superiorly which measures 18 mm in greatest dimension 1 slightly more inferior measuring 16 mm in dimension. No obstruction of flow is seen. Mild thrombus is noted within the aneurysms. Nonvascular: The liver, spleen, adrenal glands and pancreas are within normal limits. The gallbladder has been surgically removed. The appendix is within  normal limits. Fecal material is noted throughout the colon. No obstructive changes are noted. No free air or free fluid is noted within the pelvis. Uterus has been surgically removed. No acute bony abnormality is noted. Multilevel lumbar degenerative changes are seen. Mild thickening of the distal esophagus is noted which may represent esophagitis. Review of the MIP images confirms the above findings. IMPRESSION: CTA of the chest: Truncus anomaly of the thoracic aorta. No evidence of aneurysmal dilatation or dissection. 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 No aortic aneurysm or dissection is seen. Mild thickening of the  distal esophagus which may represent esophagitis. Electronically Signed   By: Inez Catalina M.D.   On: 04/09/2016 18:31     CBC  Recent Labs Lab 04/09/16 1033 04/10/16 0852 04/11/16 0408  WBC 12.2* 7.8 8.9  HGB 13.2 11.9* 11.5*  HCT 41.1 38.0 36.5  PLT 214 200 196  MCV 91.7 91.8 91.5  MCH 29.5 28.7 28.8  MCHC 32.1 31.3 31.5  RDW 13.1 13.1 13.0  LYMPHSABS 2.2  --   --   MONOABS 1.2*  --   --   EOSABS 0.3  --   --   BASOSABS 0.0  --   --     Chemistries   Recent Labs Lab 04/09/16 1033 04/09/16 1042 04/10/16 0852 04/11/16 0408  NA 139  --  139 138  K 4.4  --  3.7 3.9  CL 106  --  106 104  CO2 22  --  26 27  GLUCOSE 229*  --  218* 217*  BUN 11  --  8 6  CREATININE 1.41*  --  1.32* 1.21*  CALCIUM 8.7*  --  8.1* 8.5*  MG  --  1.3*  --  1.7  AST 16  --  16  --   ALT 13*  --  13*  --   ALKPHOS 72  --  59  --   BILITOT 0.4  --  0.3  --    ------------------------------------------------------------------------------------------------------------------ estimated creatinine clearance is 75.8 mL/min (by C-G formula based on SCr of 1.21 mg/dL). ------------------------------------------------------------------------------------------------------------------  Recent Labs  04/09/16 1955  HGBA1C 11.2*   ------------------------------------------------------------------------------------------------------------------  Recent Labs  04/10/16 0852  CHOL 89  HDL 28*  LDLCALC 45  TRIG 78  CHOLHDL 3.2   ------------------------------------------------------------------------------------------------------------------ No results for input(s): TSH, T4TOTAL, T3FREE, THYROIDAB in the last 72 hours.  Invalid input(s): FREET3 ------------------------------------------------------------------------------------------------------------------ No results for input(s): VITAMINB12, FOLATE, FERRITIN, TIBC, IRON, RETICCTPCT in the last 72 hours.  Coagulation profile No results for  input(s): INR, PROTIME in the last 168 hours.  No results for input(s): DDIMER in the last 72 hours.  Cardiac Enzymes  Recent Labs Lab 04/09/16 1954 04/10/16 0210 04/10/16 0852  TROPONINI <0.03 <0.03 <0.03   ------------------------------------------------------------------------------------------------------------------ Invalid input(s): POCBNP   CBG:  Recent Labs Lab 04/10/16 1300 04/10/16 1623 04/10/16 2122 04/11/16 0732 04/11/16 1150  GLUCAP 240* 265* 212* 191* 205*       Studies: Ct Angio Chest/abd/pel For Dissection W And/or Wo Contrast  Result Date: 04/09/2016 CLINICAL DATA:  Chest and abdominal pain for 3 days EXAM: CT ANGIOGRAPHY CHEST, ABDOMEN AND PELVIS TECHNIQUE: Multidetector CT imaging through the chest, abdomen and pelvis was performed using the standard protocol during bolus administration of intravenous contrast. Multiplanar reconstructed images and MIPs were obtained and reviewed to evaluate the vascular anatomy. CONTRAST:  80 mL Isovue 370. COMPARISON:  None. FINDINGS: CTA CHEST FINDINGS Vascular: The  thoracic aorta shows a truncus anomaly. No aneurysmal dilatation or dissection is identified. No significant calcific changes are seen. Pulmonary artery demonstrates a normal branching pattern without definitive pulmonary embolus. Cardiac structures are within normal limits. No right heart strain is noted. Nonvascular: No hilar or mediastinal adenopathy is seen. The thoracic inlet is within normal limits. The lungs are well-aerated without focal infiltrate or sizable effusion. Degenerative changes of the thoracic spine are noted. Review of the MIP images confirms the above findings. CTA ABDOMEN AND PELVIS FINDINGS Vascular: The abdominal aorta shows no aneurysmal dilatation or dissection. A normal branching pattern is seen with the visceral vessels being widely patent. No significant atherosclerotic calcifications are seen. Single renal arteries are identified  bilaterally. The left renal artery distally demonstrates multifocal aneurysmal dilatation. Two areas are noted 1 more superiorly which measures 18 mm in greatest dimension 1 slightly more inferior measuring 16 mm in dimension. No obstruction of flow is seen. Mild thrombus is noted within the aneurysms. Nonvascular: The liver, spleen, adrenal glands and pancreas are within normal limits. The gallbladder has been surgically removed. The appendix is within normal limits. Fecal material is noted throughout the colon. No obstructive changes are noted. No free air or free fluid is noted within the pelvis. Uterus has been surgically removed. No acute bony abnormality is noted. Multilevel lumbar degenerative changes are seen. Mild thickening of the distal esophagus is noted which may represent esophagitis. Review of the MIP images confirms the above findings. IMPRESSION: CTA of the chest: Truncus anomaly of the thoracic aorta. No evidence of aneurysmal dilatation or dissection. 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 No aortic aneurysm or dissection is seen. Mild thickening of the distal esophagus which may represent esophagitis. Electronically Signed   By: Inez Catalina M.D.   On: 04/09/2016 18:31      Lab Results  Component Value Date   HGBA1C 11.2 (H) 04/09/2016   HGBA1C 13.5 (H) 03/19/2015   HGBA1C 7.3 (H) 01/07/2014   Lab Results  Component Value Date   LDLCALC 45 04/10/2016   CREATININE 1.21 (H) 04/11/2016       Scheduled Meds: . aspirin  324 mg Oral Once  . carvedilol  3.125 mg Oral BID WC  . clorazepate  7.5 mg Oral Daily  . digoxin  0.125 mg Oral Daily  . furosemide  40 mg Oral Daily  . gabapentin  300 mg Oral QHS  . insulin aspart  0-5 Units Subcutaneous QHS  . insulin aspart  0-9 Units Subcutaneous TID WC  . insulin detemir  5 Units Subcutaneous Daily  . insulin detemir  8 Units Subcutaneous QHS  . isosorbide mononitrate  30 mg Oral  Daily  . LORazepam  1 mg Intravenous Once  . multivitamin with minerals  1 tablet Oral Daily  . pantoprazole  40 mg Oral Daily  . rosuvastatin  10 mg Oral Q breakfast  . sodium chloride flush  3 mL Intravenous Q12H  . spironolactone  25 mg Oral Daily   Continuous Infusions: . heparin 1,700 Units/hr (04/10/16 2215)     LOS: 0 days    Time spent: >30 MINS    [facility] Pager [phone]. If 7PM-7AM, please contact night-coverage at [email or web address], password [facility] 04/11/2016, 4:14 PM  LOS: 0 days

## 2016-04-11 NOTE — Progress Notes (Signed)
  Echocardiogram 2D Echocardiogram has been performed.  [name] 04/11/2016, 2:24 PM

## 2016-04-11 NOTE — Progress Notes (Addendum)
Inpatient Diabetes Program Recommendations  AACE/ADA: New Consensus Statement on Inpatient Glycemic Control (2015)  Target Ranges:  Prepandial:   less than 140 mg/dL      Peak postprandial:   less than 180 mg/dL (1-2 hours)      Critically ill patients:  140 - 180 mg/dL  Results for [name], [name] (MRN [id number]) as of 04/11/2016 09:24  Ref. Range 04/10/2016 07:29 04/10/2016 13:00 04/10/2016 16:23 04/10/2016 21:22 04/11/2016 07:32  Glucose-Capillary Latest Ref Range: 65 - 99 mg/dL 240 (H) 240 (H) 265 (H) 212 (H) 191 (H)   Results for [name], [name] (MRN [id number]) as of 04/11/2016 09:24  Ref. Range 04/09/2016 19:55  Hemoglobin A1C Latest Ref Range: 4.8 - 5.6 % 11.2 (H)   Review of Glycemic Control  Diabetes history: DM2 Outpatient Diabetes medications: Levemir BID (based on correction scale, Novolog 5-10 units BID (based on correction scale), Januvia 50 mg QAM Current orders for Inpatient glycemic control: Levemir 5 units QHS, Novolog 0-9 units TID with meals, Novolog 0-5 units QHS  Inpatient Diabetes Program Recommendations:    Insulin-Basal: Please consider increasing Levemir to 7 units BID (based on 150 kg x 0.1 units) starting now. Insulin-Meal Coverage: Please consider ordering Novolog 3 units TID with meals for meal coverage if patient is eating at least 50% of meals.  Addendum 04/11/16@15 :15-Spoke with patient about diabetes and home regimen for diabetes control. Patient reports that she is followed by PCP for diabetes management and she has seen an Endocrinologist in the past when referred by her PCP. Patient is currently taking Levemir BID (based on correction scale, Novolog 5-10 units BID (based on correction scale), Januvia 50 mg QAM as an outpatient for diabetes control. Patient reports that she is taking insulin as prescribed.  Patient states that she checks her glucose 2-3 times per day and that it is usually in the 200's mg/dl mg/dl range.  Inquired about prior A1C and  patient reports that she does not recall her last A1C value. Discussed A1C results (11.2% on 04/09/16) and explained that her current A1C indicates an average glucose of 275 mg/dl over the past 2-3 months. Discussed glucose and A1C goals. Discussed importance of checking CBGs and maintaining good CBG control to prevent long-term and short-term complications. Explained how hyperglycemia leads to damage within blood vessels which lead to the common complications seen with uncontrolled diabetes. Stressed to the patient the importance of improving glycemic control to prevent further complications from uncontrolled diabetes. Discussed impact of nutrition, exercise, stress, sickness, and medications on diabetes control.Encouraged patient to talk to her PCP about being referred to endocrinologist again for assistance with improve diabetes control.  Encouraged patient to check her glucose 4 times per day (before meals and at bedtime) and to keep a log book of glucose readings and insulin taken which she will need to take to doctor appointments. Explained how her doctor can use the log book to continue to make insulin adjustments if needed. Patient verbalized understanding of information discussed and he states that he has no further questions at this time related to diabetes.  Thanks, Barnie Alderman, RN, MSN, CDE Diabetes Coordinator Inpatient Diabetes Program [phone] (Team Pager from [place]) [phone] ([place] office) [phone] [facility] office) [phone] [facility] office)

## 2016-04-11 NOTE — Progress Notes (Signed)
ANTICOAGULATION CONSULT NOTE -   Pharmacy Consult for Heparin Indication: atrial fibrillation  Allergies  Allergen Reactions  . Nsaids Other (See Comments)    On warfarin  . Sulfa Antibiotics Itching    Patient Measurements: Height: 5\' 6"  (167.6 cm) Weight: (!) 327 lb (148.3 kg) IBW/kg (Calculated) : 59.3 Heparin Dosing Weight: 96.4 kg  Vital Signs: Temp: 97.9 F (36.6 C) (08/21 2123) Temp Source: Oral (08/21 2123) BP: 119/64 (08/21 2230) Pulse Rate: 94 (08/21 2123)  Labs:  Recent Labs  04/09/16 1033 04/09/16 1954 04/10/16 0210 04/10/16 0852 04/10/16 2130 04/11/16 0408  HGB 13.2  --   --  11.9*  --  11.5*  HCT 41.1  --   --  38.0  --  36.5  PLT 214  --   --  200  --  196  HEPARINUNFRC  --   --   --   --  0.18* 0.28*  CREATININE 1.41*  --   --  1.32*  --  1.21*  TROPONINI  --  <0.03 <0.03 <0.03  --   --     Estimated Creatinine Clearance: 75 mL/min (by C-G formula based on SCr of 1.21 mg/dL).  Assessment: 60 y.o. female with chest pain, h/o Afib and Pradaxa on hold for heparin  Goal of Therapy:  Heparin level 0.3-0.7 units/ml Monitor platelets by anticoagulation protocol: Yes   Plan:  Increase Heparin  1900 units/hr  Phillis Knack, PharmD, BCPS  04/11/2016,4:45 AM

## 2016-04-12 ENCOUNTER — Observation Stay (HOSPITAL_COMMUNITY): Payer: Medicare HMO

## 2016-04-12 ENCOUNTER — Encounter (HOSPITAL_COMMUNITY): Payer: Self-pay | Admitting: *Deleted

## 2016-04-12 DIAGNOSIS — R0789 Other chest pain: Secondary | ICD-10-CM | POA: Diagnosis not present

## 2016-04-12 DIAGNOSIS — I482 Chronic atrial fibrillation: Secondary | ICD-10-CM | POA: Diagnosis not present

## 2016-04-12 DIAGNOSIS — E119 Type 2 diabetes mellitus without complications: Secondary | ICD-10-CM

## 2016-04-12 DIAGNOSIS — Z794 Long term (current) use of insulin: Secondary | ICD-10-CM

## 2016-04-12 DIAGNOSIS — G4733 Obstructive sleep apnea (adult) (pediatric): Secondary | ICD-10-CM | POA: Diagnosis not present

## 2016-04-12 LAB — ECHOCARDIOGRAM COMPLETE
FS: 35 % (ref 28–44)
Height: 66 in
IVS/LV PW RATIO, ED: 0.91
LA ID, A-P, ES: 50 mm
LA diam end sys: 50 mm
LA diam index: 2.02 cm/m2
LA vol A4C: 96.1 ml
LA vol index: 36.9 mL/m2
LA vol: 91.5 mL
LV PW d: 9.06 mm — AB (ref 0.6–1.1)
LVOT area: 3.14 cm2
LVOT diameter: 20 mm
Reg peak vel: 285 cm/s
TR max vel: 285 cm/s
Weight: 5316.8 oz

## 2016-04-12 LAB — CBC
HCT: 36 % (ref 36.0–46.0)
Hemoglobin: 11.5 g/dL — ABNORMAL LOW (ref 12.0–15.0)
MCH: 28.9 pg (ref 26.0–34.0)
MCHC: 31.9 g/dL (ref 30.0–36.0)
MCV: 90.5 fL (ref 78.0–100.0)
Platelets: 209 10*3/uL (ref 150–400)
RBC: 3.98 MIL/uL (ref 3.87–5.11)
RDW: 12.7 % (ref 11.5–15.5)
WBC: 9.3 10*3/uL (ref 4.0–10.5)

## 2016-04-12 LAB — GLUCOSE, CAPILLARY
Glucose-Capillary: 244 mg/dL — ABNORMAL HIGH (ref 65–99)
Glucose-Capillary: 219 mg/dL — ABNORMAL HIGH (ref 65–99)
Glucose-Capillary: 352 mg/dL — ABNORMAL HIGH (ref 65–99)
Glucose-Capillary: 266 mg/dL — ABNORMAL HIGH (ref 65–99)

## 2016-04-12 LAB — HEPARIN LEVEL (UNFRACTIONATED): Heparin Unfractionated: 0.54 IU/mL (ref 0.30–0.70)

## 2016-04-12 MED ORDER — FUROSEMIDE 40 MG PO TABS: 40.0000 mg | ORAL_TABLET | Freq: Every day | ORAL | 0 refills | Status: DC | PRN

## 2016-04-12 MED ORDER — DABIGATRAN ETEXILATE MESYLATE 150 MG PO CAPS: 150.0000 mg | ORAL_CAPSULE | Freq: Two times a day (BID) | ORAL | 0 refills | Status: DC

## 2016-04-12 MED ORDER — CARVEDILOL 6.25 MG PO TABS
6.2500 mg | ORAL_TABLET | Freq: Two times a day (BID) | ORAL | Status: DC
  Administered 2016-04-12: 6.25 mg via ORAL
  Filled 2016-04-12: qty 1

## 2016-04-12 MED ORDER — DABIGATRAN ETEXILATE MESYLATE 150 MG PO CAPS
150.0000 mg | ORAL_CAPSULE | Freq: Two times a day (BID) | ORAL | Status: DC
  Administered 2016-04-12: 150 mg via ORAL
  Filled 2016-04-12: qty 1

## 2016-04-12 MED ORDER — LOSARTAN POTASSIUM 25 MG PO TABS: 25.0000 mg | ORAL_TABLET | Freq: Every day | ORAL | 0 refills | Status: DC

## 2016-04-12 NOTE — Progress Notes (Signed)
Subjective:  Continues to have vague retrosternal chest pain. No anginal chest pain. Nuclear stress test negative for ischemia EF has improved by nuclear stress test and 2-D echo. Occasional episode of A. fib with RVR and nonsustained.  Objective:  Vital Signs in the last 24 hours: Temp:  [97.6 F (36.4 C)-98.3 F (36.8 C)] 97.6 F (36.4 C) (08/23 0818) Pulse Rate:  [92-104] 92 (08/23 0818) Resp:  [17-18] 17 (08/23 0818) BP: (72-126)/(52-76) 72/55 (08/23 0818) SpO2:  [100 %] 100 % (08/23 0818) Weight:  [330 lb 12.8 oz (150 kg)] 330 lb 12.8 oz (150 kg) (08/23 0349)  Intake/Output from previous day: 08/22 0701 - 08/23 0700 In: 1274.4 [P.O.:720; I.V.:554.4] Out: 1300 [Urine:1300] Intake/Output from this shift: No intake/output data recorded.  Physical Exam: Neck: no adenopathy, no carotid bruit, no JVD and supple, symmetrical, trachea midline Lungs: clear to auscultation bilaterally Heart: irregularly irregular rhythm, S1, S2 normal and Soft systolic murmur noted Abdomen: soft, non-tender; bowel sounds normal; no masses,  no organomegaly Extremities: extremities normal, atraumatic, no cyanosis or edema  Lab Results:  Recent Labs  04/11/16 0408 04/12/16 0354  WBC 8.9 9.3  HGB 11.5* 11.5*  PLT 196 209    Recent Labs  04/11/16 0408 04/11/16 1651  NA 138 134*  K 3.9 3.8  CL 104 99*  CO2 27 27  GLUCOSE 217* 283*  BUN 6 <5*  CREATININE 1.21* 1.24*    Recent Labs  04/10/16 0210 04/10/16 0852  TROPONINI <0.03 <0.03   Hepatic Function Panel  Recent Labs  04/11/16 1651  PROT 7.4  ALBUMIN 3.2*  AST 18  ALT 13*  ALKPHOS 69  BILITOT 0.4    Recent Labs  04/10/16 0852  CHOL 89   No results for input(s): PROTIME in the last 72 hours.  Imaging: Imaging results have been reviewed and Mr Cervical Spine Wo Contrast  Result Date: 04/12/2016 CLINICAL DATA:  Neck pain radiating to the left shoulder and arm with numbness in the left arm. EXAM: MRI CERVICAL SPINE  WITHOUT CONTRAST TECHNIQUE: Multiplanar, multisequence MR imaging of the cervical spine was performed. No intravenous contrast was administered. COMPARISON:  None. FINDINGS: Despite efforts by the technologist and patient, motion artifact is present on today's exam and could not be eliminated. This reduces exam sensitivity and specificity. Alignment: No vertebral subluxation is observed. Vertebrae: No significant vertebral marrow edema is identified. Cord: No significant abnormal spinal cord signal is observed. Posterior Fossa, vertebral arteries, paraspinal tissues: 9 mm right upper internal jugular node, image 23/8, upper normal size. No overtly pathologic adenopathy. Otherwise unremarkable. Disc levels: C2-3:  Unremarkable. C3-4:  Unremarkable C4-5: Mild right foraminal stenosis due to disc bulge and uncinate spurring. C5-6:  Unremarkable. C6-7:  No impingement.  Mild disc bulge. C7-T1:  Unremarkable. IMPRESSION: 1. Mild cervical spondylosis and degenerative disc disease, causing mild right foraminal stenosis at C4-5. No specific left-sided impingement is identified to explain the patient's left eccentric symptoms. Electronically Signed   By: Van Clines M.D.   On: 04/12/2016 11:18   Nm Myocar Multi W/spect W/wall Motion / Ef  Result Date: 04/11/2016 CLINICAL DATA:  Chest pain and shortness of breath. History of heart failure, atrial fibrillation, hypertension, hyperlipidemia, morbid obesity and COPD. EXAM: MYOCARDIAL IMAGING WITH SPECT (REST AND PHARMACOLOGIC-STRESS - 2 DAY PROTOCOL) GATED LEFT VENTRICULAR WALL MOTION STUDY LEFT VENTRICULAR EJECTION FRACTION TECHNIQUE: Standard myocardial SPECT imaging was performed after resting intravenous injection of 10 mCi Tc-23m tetrofosmin. Subsequently, on a second day, intravenous infusion  of Lexiscan was performed under the supervision of the Cardiology staff. At peak effect of the drug, 30 mCi Tc-35m tetrofosmin was injected intravenously and standard  myocardial SPECT imaging was performed. Quantitative gated imaging was also performed to evaluate left ventricular wall motion, and estimate left ventricular ejection fraction. COMPARISON:  None. FINDINGS: Perfusion: Apical attenuation present on the rest acquisition. The stress study shows normal perfusion in this distribution and there is no evidence of inducible myocardial ischemia or fixed perfusion defects. Wall Motion: Normal left ventricular wall motion. No left ventricular dilation. Left Ventricular Ejection Fraction: 54 % End diastolic volume 95 ml End systolic volume 44 ml IMPRESSION: 1. No evidence of inducible myocardial ischemia. No fixed perfusion defects. 2. Normal left ventricular wall motion. 3. Left ventricular ejection fraction 54% 4. Non invasive risk stratification*: Low *2012 Appropriate Use Criteria for Coronary Revascularization Focused Update: J Am Coll Cardiol. [id number]. http://content.airportbarriers.com.aspx?articleid=[email or web address] Electronically Signed   By: Aletta Edouard M.D.   On: 04/11/2016 16:27   US Abdomen Limited  Result Date: 04/11/2016 CLINICAL DATA:  Patient with right upper quadrant pain. EXAM: US ABDOMEN LIMITED - RIGHT UPPER QUADRANT COMPARISON:  CT CAP 04/09/2016 FINDINGS: Gallbladder: Surgically absent Common bile duct: Diameter: 2 mm Liver: Diffusely increased in echogenicity.  No focal lesion identified. There is suggestion of incomplete filling of the portal vein with color Doppler imaging. IMPRESSION: Suggestion of incomplete filling of the portal vein with color Doppler imaging, raising the possibility of nonocclusive portal venous thrombosis. Consider correlation with portal venous phase CT or MRI examination. Hepatic steatosis. These results will be called to the ordering clinician or representative by the Radiologist Assistant, and communication documented in the PACS or zVision Dashboard. Electronically Signed   By: Lovey Newcomer M.D.   On: 04/11/2016  21:08    Cardiac Studies:  Assessment/Plan:  Recurrent chest pain some features worrisome for angina. MI ruled out  negative nuclear stress test  Compensated systolic/diastolic congestive heart failure. Chronic atrial fibrillation. Hypertension. Diabetes mellitus COPD Obstructive sleep apnea/obesity hypoventilation syndrome. Morbid obesity. Chronic kidney disease. History of DVT. Depression. Degenerative joint disease. Plan DC Lasix, isosorbide mononitrate, aspirin, heparin, Increase carvedilol to 6.25 mg twice daily Restart Pradaxa per pharmacy protocol. Okay to discharge  from cardiac point of view. Follow-up with Dr. Montez Morita in  2 weeks  LOS: 0 days    Charolette Forward 04/12/2016, 11:42 AM

## 2016-04-12 NOTE — Progress Notes (Signed)
ANTICOAGULATION CONSULT NOTE -   Pharmacy Consult for Heparin Indication: atrial fibrillation  Allergies  Allergen Reactions  . Nsaids Other (See Comments)    On warfarin  . Sulfa Antibiotics Itching    Patient Measurements: Height: 5\' 6"  (167.6 cm) Weight: (!) 330 lb 12.8 oz (150 kg) IBW/kg (Calculated) : 59.3 Heparin Dosing Weight: 96.4 kg  Vital Signs: Temp: 97.6 F (36.4 C) (08/23 0818) Temp Source: Axillary (08/23 0818) BP: 72/55 (08/23 0818) Pulse Rate: 92 (08/23 0818)  Labs:  Recent Labs  04/09/16 1954 04/10/16 0210 04/10/16 0852  04/11/16 0408 04/11/16 1651 04/12/16 0354  HGB  --   --  11.9*  --  11.5*  --  11.5*  HCT  --   --  38.0  --  36.5  --  36.0  PLT  --   --  200  --  196  --  209  HEPARINUNFRC  --   --   --   < > 0.28* 0.49 0.54  CREATININE  --   --  1.32*  --  1.21* 1.24*  --   TROPONINI <0.03 <0.03 <0.03  --   --   --   --   < > = values in this interval not displayed.  Estimated Creatinine Clearance: 73.7 mL/min (by C-G formula based on SCr of 1.24 mg/dL).  Assessment: 60 y.o. female with chest pain, h/o Afib and Pradaxa - currently on hold and continuing on heparin. Black stools noted, FOBT+ but Hg stable, no active bleeding per MD notes.  Heparin level remains therapeutic (0.54) on 1900 units/hr, stress test 8/22, results pending. Hg low stable, plt wnl.  Goal of Therapy:  Heparin level 0.3-0.7 units/ml Monitor platelets by anticoagulation protocol: Yes   Plan:  Heparin at 1900 units/h Daily HL/CBC Monitor s/sx bleeding Pradaxa pta on hold for now  Elicia Lamp, PharmD, [facility] Clinical Pharmacist Pager [phone] 04/12/2016 8:58 AM

## 2016-04-12 NOTE — Progress Notes (Signed)
Pt up to restroom, heart rate increased to 150s, MD  Notified, recovers quickly  With rest.  Edward Qualia RN

## 2016-04-12 NOTE — Progress Notes (Signed)
Results for MAKYRAH, WEECH (MRN [id number]) as of 04/12/2016 14:00  Ref. Range 04/11/2016 11:50 04/11/2016 16:37 04/11/2016 21:18 04/12/2016 08:11 04/12/2016 11:12  Glucose-Capillary Latest Ref Range: 65 - 99 mg/dL 205 (H) 244 (H) 165 (H) 219 (H) 352 (H)   Noted that blood sugars continue to be elevated. Recommend increasing HS Levemir dose to 8 units. May want to increase Novolog correction scale to MODERATE TID & HS if blood sugars continue to be elevated. Will continue to monitor blood sugars while in the hospital. Harvel Ricks RN BSN CDE

## 2016-04-12 NOTE — Discharge Instructions (Signed)
Chest Pain Observation °It is often hard to give a specific diagnosis for the cause of chest pain. Among other possibilities your symptoms might be caused by inadequate oxygen delivery to your heart (angina). Angina that is not treated or evaluated can lead to a heart attack (myocardial infarction) or death. °Blood tests, electrocardiograms, and X-rays may have been done to help determine a possible cause of your chest pain. After evaluation and observation, your health care provider has determined that it is unlikely your pain was caused by an unstable condition that requires hospitalization. However, a full evaluation of your pain may need to be completed, with additional diagnostic testing as directed. It is very important to keep your follow-up appointments. Not keeping your follow-up appointments could result in permanent heart damage, disability, or death. If there is any problem keeping your follow-up appointments, you must call your health care provider. °HOME CARE INSTRUCTIONS  °Due to the slight chance that your pain could be angina, it is important to follow your health care provider's treatment plan and also maintain a healthy lifestyle: °· Maintain or work toward achieving a healthy weight. °· Stay physically active and exercise regularly. °· Decrease your salt intake. °· Eat a balanced, healthy diet. Talk to a dietitian to learn about heart-healthy foods. °· Increase your fiber intake by including whole grains, vegetables, fruits, and nuts in your diet. °· Avoid situations that cause stress, anger, or depression. °· Take medicines as advised by your health care provider. Report any side effects to your health care provider. Do not stop medicines or adjust the dosages on your own. °· Quit smoking. Do not use nicotine patches or gum until you check with your health care provider. °· Keep your blood pressure, blood sugar, and cholesterol levels within normal limits. °· Limit alcohol intake to no more than  1 drink per day for women who are not pregnant and 2 drinks per day for men. °· Do not abuse drugs. °SEEK IMMEDIATE MEDICAL CARE IF: °You have severe chest pain or pressure which may include symptoms such as: °· You feel pain or pressure in your arms, neck, jaw, or back. °· You have severe back or abdominal pain, feel sick to your stomach (nauseous), or throw up (vomit). °· You are sweating profusely. °· You are having a fast or irregular heartbeat. °· You feel short of breath while at rest. °· You notice increasing shortness of breath during rest, sleep, or with activity. °· You have chest pain that does not get better after rest or after taking your usual medicine. °· You wake from sleep with chest pain. °· You are unable to sleep because you cannot breathe. °· You develop a frequent cough or you are coughing up blood. °· You feel dizzy, faint, or experience extreme fatigue. °· You develop severe weakness, dizziness, fainting, or chills. °Any of these symptoms may represent a serious problem that is an emergency. Do not wait to see if the symptoms will go away. Call your local emergency services (911 in the [place] Do not drive yourself to the hospital. °MAKE SURE YOU: °· Understand these instructions. °· Will watch your condition. °· Will get help right away if you are not doing well or get worse. °  °This information is not intended to replace advice given to you by your health care provider. Make sure you discuss any questions you have with your health care provider. °  °Document Released: 09/09/2010 Document Revised: 08/12/2013 Document Reviewed: 02/06/2013 °Elsevier Interactive Patient   Education 2016 [employer].  Cardiac-Specific Troponin I and T Test WHY AM I HAVING THIS TEST? You may have this test if you have experienced chest pain. The test can be used to determine if you have had a heart attack or injury to heart (cardiac) muscle. This test can also help predict the possibility of future heart  attacks. This test measures the concentration of cardiac-specific troponin in your blood. Troponins are proteins that help muscles contract. There are three forms of troponin, including troponins C, I, and T. The types of troponins I and T that are found in cardiac muscle are different from the troponins I and T that are found in skeletal muscle. Therefore, testing can be done for cardiac-specific troponins I and T. These types of troponin are normally present in very small quantities in the blood. When there is damage to heart muscle cells, cardiac troponins I and T are released into circulation. The more damage there is, the greater the concentration of troponins I and T. When a person has a heart attack, levels of troponin can become elevated in the blood within 3-4 hours after injury and may remain elevated for 10-14 days. WHAT KIND OF SAMPLE IS TAKEN? A blood sample is required for this test. It is usually collected by inserting a needle into a vein. Usually, an initial blood sample is collected, and then another blood sample is collected 12 hours later. After these samples, you will have your blood tested daily for 3-5 days. You might also have it tested weekly for 5-6 weeks. HOW DO I PREPARE FOR THE TEST? There is no preparation required for this test. However, be aware that you will need to make arrangements to have your blood collected frequently.  WHAT ARE THE REFERENCE RANGES? Reference values are considered healthy values established after testing a large group of healthy people. Reference values may vary among different people, labs, and hospitals. It is your responsibility to obtain your test results. Ask the lab or department performing the test when and how you will get your results. Reference values for cardiac troponins are as follows:  Cardiac troponin T: less than 0.1 ng/mL.  Cardiac troponin I: less than 0.03 ng/mL. WHAT DO THE RESULTS MEAN? Troponin values above the reference  values may indicate:  Injury to the heart muscle.  Heart attack. Talk with your health care provider to discuss your results, treatment options, and if necessary, the need for more tests. Talk with your health care provider if you have any questions about your results.   This information is not intended to replace advice given to you by your health care provider. Make sure you discuss any questions you have with your health care provider.   Document Released: 09/09/2004 Document Revised: 08/28/2014 Document Reviewed: 12/31/2013 Elsevier Interactive Patient Education [employer].

## 2016-04-12 NOTE — Progress Notes (Signed)
ANTICOAGULATION CONSULT NOTE -   Pharmacy Consult for Heparin>Pradaxa Indication: atrial fibrillation  Allergies  Allergen Reactions  . Nsaids Other (See Comments)    On warfarin  . Sulfa Antibiotics Itching    Patient Measurements: Height: 5\' 6"  (167.6 cm) Weight: (!) 330 lb 12.8 oz (150 kg) IBW/kg (Calculated) : 59.3 Heparin Dosing Weight: 96.4 kg  Vital Signs: Temp: 97.6 F (36.4 C) (08/23 0818) Temp Source: Axillary (08/23 0818) BP: 72/55 (08/23 0818) Pulse Rate: 92 (08/23 0818)  Labs:  Recent Labs  04/09/16 1954 04/10/16 0210  04/10/16 0852  04/11/16 0408 04/11/16 1651 04/12/16 0354  HGB  --   --   < > 11.9*  --  11.5*  --  11.5*  HCT  --   --   --  38.0  --  36.5  --  36.0  PLT  --   --   --  200  --  196  --  209  HEPARINUNFRC  --   --   --   --   < > 0.28* 0.49 0.54  CREATININE  --   --   --  1.32*  --  1.21* 1.24*  --   TROPONINI <0.03 <0.03  --  <0.03  --   --   --   --   < > = values in this interval not displayed.  Estimated Creatinine Clearance: 73.7 mL/min (by C-G formula based on SCr of 1.24 mg/dL).  Assessment: 60 y.o. female with chest pain, h/o Afib and hx of DVT Pradaxa - currently on hold and continuing on heparin. Black stools noted, FOBT+ but Hg stable, no active bleeding per MD notes.  MI ruled out, stress test results negative. Pharmacy consulted to transition from heparin IV back to PTA Pradaxa. Hg low stable, plt wnl.  Goal of Therapy:  Stroke prevention Monitor platelets by anticoagulation protocol: Yes   Plan:  Heparin IV >> Pradaxa 150mg  PO BID (was on reduced dose pta, unsure why? Discussed with Harwani and ok to increase to full dose) Turn off heparin drip when 1st dose of Pradaxa given (communicated with RN) Monitor CBC, s/sx bleeding  Elicia Lamp, PharmD, [facility] Clinical Pharmacist Pager [phone] 04/12/2016 11:54 AM

## 2016-04-12 NOTE — Discharge Summary (Addendum)
Discharge Summary  Aracelis Handelsman [id number] DOB: 1956/05/20  PCP: Patricia Nettle, MD  Admit date: 04/09/2016 Discharge date: 04/12/2016  Time spent: >45mins  Recommendations for Outpatient Follow-up:  1. F/u with PMD within a week  for hospital discharge follow up, repeat cbc/bmp at follow up, consider CT with protal venous phase if patient develops persistent abdominal pain, to be determined  By pmd  2. F/u with cardiology   Discharge Diagnoses:  Active Hospital Problems   Diagnosis Date Noted  . Nausea vomiting and diarrhea   . Chest pain 02/26/2012  . OSA (obstructive sleep apnea) 09/21/2011  . Atrial fibrillation ([place]) 02/04/2010  . Diabetes mellitus ([place]) 02/04/2010  . Morbid obesity ([place]) 02/04/2010    Resolved Hospital Problems   Diagnosis Date Noted Date Resolved  No resolved problems to display.    Discharge Condition: stable  Diet recommendation: heart healthy/carb modified  Filed Weights   04/10/16 0544 04/11/16 0529 04/12/16 0349  Weight: (!) 148.3 kg (327 lb) (!) 150.7 kg (332 lb 4.8 oz) (!) 150 kg (330 lb 12.8 oz)    History of present illness:   Chief Complaint: chest pain  HPI: [name] is a 60 y.o. female with medical history significant of chronic combined systolic and diastolic heart failure, COPD, diabetes, atrial fibrillation, hypertension, hyperlipidemia, morbid obesity, obstructive sleep apnea, presents to the emergency room with a chief complaint of chest pain. Patient tells me that she's been having chest pain almost continuously for the past 3 days,  And feels like an elephant is sitting on her chest.  She is also complaining of shortness of breath associated with her chest pain.She is complaining of abdominal pain, nausea no vomiting however is having diarrhea. She complains of dark stools over the last several days.She has lightheadedness and dizziness.She complains of feeling very anxious over the last few days as well,  and she does appear quite anxious in the emergency room. In the ED, initial troponin was negative, EKG shows A. Fib, her creatinine is elevated however it appears to be at baseline,she will undergo a CT angiogram of the chest per EDP and TRH was asked for admission for chest pain rule out.  Hospital Course:  Active Problems:   Diabetes mellitus (HCC)   Morbid obesity (HCC)   Atrial fibrillation (HCC)   OSA (obstructive sleep apnea)   Chest pain   Nausea vomiting and diarrhea   Chest pain-multiple cardiovascular risk factors, low EF, uncontrolled diabetes Cardiac enzymes negative 3, EKG with A. Fib, however nonischemic,- CT angio did not show any dissection, did show mild thickening of the distal esophagus which is consistent with esophagitis - consulted cardiology, discussed with Dr. Terrence Dupont, appreciate input. She underwent a stress test in 2016 which showed depressed ejection fraction to 30-40%, intermediate risk finding.  stress test on 8/22, with improved EF and low risk stress test  cervical radiculopathy like sx , MRI c-spine with mild degenerative changes   RUQ  ultrasound with ? Nonocclusive portal venous thrombosis, patient did not have persistent abdominal pain, she is already on pradaxa bid, consider CT with protal venous phase if patient develop persistent abdominal pain to be determined  By pmd  Chronic Atrial fibrillation - patient's CHA2DS2-VASc Score for Stroke Risk is 4, continue Pradaxa 150mg  bid (patient was previouslt on 75mg  bid) -she was observed initially on admission with occasional asymptomatic bradycardia as low as mid 30s, asymptomatic for which coreg dose was reduced, later she developed tachycardia, home does coreg restarted. She is  continued on home meds digoxin, she is instructed to record her heart rate and blood pressure, continue meds adjustment outpatient. She is to continue followed by pmd and cardiology  Hypomagnesemia-replete  Insulin dependent DM -   A1C 11.2 , lipid panel shows LDL of 45, triglycerides 78, start patient on a statin -Levemir to 5 units bid  + SSI, she report she is trying to loose weight and diet control, she is advised to continue work with her pmd for blood sugar control  OSA - CPAP  Anxiety - continue home medications  Abdominal pain - CT with esophagitis, no abdominal pain at discharge  Black stools - did show FOBT positive however patient's Hb is normal, she is not having active bleeding,   - will eventually benefit from GI evaluation since she is on Pradaxa,hg stable,  unless CBC drops significantly this could be done as an outpatient - most recent colonoscopy 2016 by [name] with polyp removal  CKD III, baseline around 1.4  - Cr at baseline, monitor  Body mass index is 53.39 kg/m. Patient report she has been trying to loose weight, she has lost about 200pounds down to 300pounds from 500pounds.  DVT prophylaxsis Pradaxa  Code Status:  Full code     Family Communication: Discussed in detail with the patient, all imaging results, lab results explained to the patient   Disposition Plan:  Cardiology consult      Consultants:  Cardiology  Procedures:  2-D echo  Cardiac stress test, low risk  Antibiotics:    Anti-infectives    None      Discharge Exam: BP (!) 144/102 (BP Location: Right Wrist)   Pulse 100   Temp 97.5 F (36.4 C) (Oral)   Resp 14   Ht 5\' 6"  (1.676 m)   Wt (!) 150 kg (330 lb 12.8 oz)   SpO2 97%   BMI 53.39 kg/m   General: morbid obesity Cardiovascular: IRRR Respiratory: CTABL Extremity: no edema  Discharge Instructions You were cared for by a hospitalist during your hospital stay. If you have any questions about your discharge medications or the care you received while you were in the hospital after you are discharged, you can call the unit and asked to speak with the hospitalist on call if the hospitalist that took care of you is not  available. Once you are discharged, your primary care physician will handle any further medical issues. Please note that NO REFILLS for any discharge medications will be authorized once you are discharged, as it is imperative that you return to your primary care physician (or establish a relationship with a primary care physician if you do not have one) for your aftercare needs so that they can reassess your need for medications and monitor your lab values.  Discharge Instructions    Diet - low sodium heart healthy    Complete by:  As directed   Carb modified   Discharge instructions    Complete by:  As directed   Please check your blood pressure and heart rate at home and bring your recordings to your doctor's appointment for further blood pressure meds adjustment, please perform daily weight, check your leg for fluids, call your doctor if you have weight gain more than 3pounds in three days or start to have edema in legs. Please close monitor your blood sugar, bring your blood sugar recording to your doctor for blood sugar control, Please continue the good job on loosing weight. Please work with your primary  care doctor for further weight management assistance.   Increase activity slowly    Complete by:  As directed       Medication List    STOP taking these medications   isosorbide mononitrate 30 MG 24 hr tablet Commonly known as:  IMDUR   potassium chloride SA 20 MEQ tablet Commonly known as:  K-DUR,KLOR-CON     TAKE these medications   ANTI-ITCH lotion Generic drug:  camphor-menthol Apply 1 application topically as needed for itching.   carvedilol 12.5 MG tablet Commonly known as:  COREG Take 12.5 mg by mouth 2 (two) times daily with a meal.   clorazepate 7.5 MG tablet Commonly known as:  TRANXENE Take 7.5 mg by mouth daily.   dabigatran 150 MG Caps capsule Commonly known as:  PRADAXA Take 1 capsule (150 mg total) by mouth every 12 (twelve) hours. What  changed:  medication strength  how much to take  when to take this   digoxin 0.25 MG tablet Commonly known as:  LANOXIN Take 0.5 tablets (0.125 mg total) by mouth daily.   diphenhydrAMINE 25 MG tablet Commonly known as:  BENADRYL Take 25 mg by mouth every 4 (four) hours as needed for itching.   furosemide 40 MG tablet Commonly known as:  LASIX Take 1 tablet (40 mg total) by mouth daily as needed for fluid or edema. What changed:  when to take this  reasons to take this   gabapentin 300 MG capsule Commonly known as:  NEURONTIN Take 300 mg by mouth at bedtime.   insulin aspart 100 UNIT/ML injection Commonly known as:  novoLOG Inject 5-10 Units into the skin every 6 (six) hours. 200-300 takes 5 units, 301-400 takes 7 units, 401-500 takes 12 units. Sliding Scale   insulin detemir 100 UNIT/ML injection Commonly known as:  LEVEMIR Inject 5 Units into the skin 2 (two) times daily as needed (blood sugar over 200).   losartan 25 MG tablet Commonly known as:  COZAAR Take 1 tablet (25 mg total) by mouth daily. What changed:  medication strength  how much to take  when to take this   MULTIVITAMIN WOMEN PO Take 1 tablet by mouth daily.   nitroGLYCERIN 0.4 MG SL tablet Commonly known as:  NITROSTAT Place 1 tablet (0.4 mg total) under the tongue every 5 (five) minutes x 3 doses as needed for chest pain.   omeprazole 40 MG capsule Commonly known as:  PRILOSEC Take 40 mg by mouth every morning.   PATANOL 0.1 % ophthalmic solution Generic drug:  olopatadine Place 1 drop into both eyes 2 (two) times daily.   PROAIR HFA 108 (90 Base) MCG/ACT inhaler Generic drug:  albuterol Inhale 2 puffs into the lungs every 4 (four) hours as needed for wheezing (wheezing).   rosuvastatin 10 MG tablet Commonly known as:  CRESTOR Take 10 mg by mouth daily with breakfast.   sitaGLIPtin 50 MG tablet Commonly known as:  JANUVIA Take 50 mg by mouth daily with breakfast.    spironolactone 25 MG tablet Commonly known as:  ALDACTONE Take 25 mg by mouth daily.      Allergies  Allergen Reactions  . Nsaids Other (See Comments)    On warfarin  . Sulfa Antibiotics Itching   Follow-up Information    SPRUILL,JEROME O, MD Follow up in 1 week(s).   Specialty:  Cardiology Why:  hospital discharge follow up, repeat cbc/bmp, please bring in your blood pressure record/ daily weight record to your doctor's appointment. consider CT  with protal venous phase if you develop persistent abdominal pain, to be determined  [employer] information: [place] [address] [phone]        Charolette Forward, MD Follow up in 1 month(s).   Specialty:  Cardiology Why:  af[facility] Contact information: [address] [place] [address] [phone]            The results of significant diagnostics from this hospitalization (including imaging, microbiology, ancillary and laboratory) are listed below for reference.    Significant Diagnostic Studies: Dg Chest 2 View  Result Date: 04/09/2016 CLINICAL DATA:  Chest and abdominal pain, 3 days duration. Vomiting after meals. EXAM: CHEST  2 VIEW COMPARISON:  03/19/2015 FINDINGS: The heart is chronically enlarged. There is aortic atherosclerosis. The pulmonary vascularity is normal. The lungs are clear. No effusions. No bony abnormalities. IMPRESSION: Cardiomegaly.  Aortic atherosclerosis.  No active process otherwise. Electronically Signed   By: Nelson Chimes M.D.   On: 04/09/2016 10:08   Mr Cervical Spine Wo Contrast  Result Date: 04/12/2016 CLINICAL DATA:  Neck pain radiating to the left shoulder and arm with numbness in the left arm. EXAM: MRI CERVICAL SPINE WITHOUT CONTRAST TECHNIQUE: Multiplanar, multisequence MR imaging of the cervical spine was performed. No intravenous contrast was administered. COMPARISON:  None. FINDINGS: Despite efforts by the technologist and patient, motion  artifact is present on today's exam and could not be eliminated. This reduces exam sensitivity and specificity. Alignment: No vertebral subluxation is observed. Vertebrae: No significant vertebral marrow edema is identified. Cord: No significant abnormal spinal cord signal is observed. Posterior Fossa, vertebral arteries, paraspinal tissues: 9 mm right upper internal jugular node, image 23/8, upper normal size. No overtly pathologic adenopathy. Otherwise unremarkable. Disc levels: C2-3:  Unremarkable. C3-4:  Unremarkable C4-5: Mild right foraminal stenosis due to disc bulge and uncinate spurring. C5-6:  Unremarkable. C6-7:  No impingement.  Mild disc bulge. C7-T1:  Unremarkable. IMPRESSION: 1. Mild cervical spondylosis and degenerative disc disease, causing mild right foraminal stenosis at C4-5. No specific left-sided impingement is identified to explain the patient's left eccentric symptoms. Electronically Signed   By: Van Clines M.D.   On: 04/12/2016 11:18   Nm Myocar Multi W/spect W/wall Motion / Ef  Result Date: 04/11/2016 CLINICAL DATA:  Chest pain and shortness of breath. History of heart failure, atrial fibrillation, hypertension, hyperlipidemia, morbid obesity and COPD. EXAM: MYOCARDIAL IMAGING WITH SPECT (REST AND PHARMACOLOGIC-STRESS - 2 DAY PROTOCOL) GATED LEFT VENTRICULAR WALL MOTION STUDY LEFT VENTRICULAR EJECTION FRACTION TECHNIQUE: Standard myocardial SPECT imaging was performed after resting intravenous injection of 10 mCi Tc-41m tetrofosmin. Subsequently, on a second day, intravenous infusion of Lexiscan was performed under the supervision of the Cardiology staff. At peak effect of the drug, 30 mCi Tc-16m tetrofosmin was injected intravenously and standard myocardial SPECT imaging was performed. Quantitative gated imaging was also performed to evaluate left ventricular wall motion, and estimate left ventricular ejection fraction. COMPARISON:  None. FINDINGS: Perfusion: Apical attenuation  present on the rest acquisition. The stress study shows normal perfusion in this distribution and there is no evidence of inducible myocardial ischemia or fixed perfusion defects. Wall Motion: Normal left ventricular wall motion. No left ventricular dilation. Left Ventricular Ejection Fraction: 54 % End diastolic volume 95 ml End systolic volume 44 ml IMPRESSION: 1. No evidence of inducible myocardial ischemia. No fixed perfusion defects. 2. Normal left ventricular wall motion. 3. Left ventricular ejection fraction 54% 4. Non invasive risk  stratification*: Low *2012 Appropriate Use Criteria for Coronary Revascularization Focused Update: J Am Coll Cardiol. [id number]. http://content.airportbarriers.com.aspx?articleid=[email or web address] Electronically Signed   By: Aletta Edouard M.D.   On: 04/11/2016 16:27   US Abdomen Limited  Result Date: 04/11/2016 CLINICAL DATA:  Patient with right upper quadrant pain. EXAM: US ABDOMEN LIMITED - RIGHT UPPER QUADRANT COMPARISON:  CT CAP 04/09/2016 FINDINGS: Gallbladder: Surgically absent Common bile duct: Diameter: 2 mm Liver: Diffusely increased in echogenicity.  No focal lesion identified. There is suggestion of incomplete filling of the portal vein with color Doppler imaging. IMPRESSION: Suggestion of incomplete filling of the portal vein with color Doppler imaging, raising the possibility of nonocclusive portal venous thrombosis. Consider correlation with portal venous phase CT or MRI examination. Hepatic steatosis. These results will be called to the ordering clinician or representative by the Radiologist Assistant, and communication documented in the PACS or zVision Dashboard. Electronically Signed   By: Lovey Newcomer M.D.   On: 04/11/2016 21:08   Ct Angio Chest/abd/pel For Dissection W And/or Wo Contrast  Result Date: 04/09/2016 CLINICAL DATA:  Chest and abdominal pain for 3 days EXAM: CT ANGIOGRAPHY CHEST, ABDOMEN AND PELVIS TECHNIQUE: Multidetector CT imaging  through the chest, abdomen and pelvis was performed using the standard protocol during bolus administration of intravenous contrast. Multiplanar reconstructed images and MIPs were obtained and reviewed to evaluate the vascular anatomy. CONTRAST:  80 mL Isovue 370. COMPARISON:  None. FINDINGS: CTA CHEST FINDINGS Vascular: The thoracic aorta shows a truncus anomaly. No aneurysmal dilatation or dissection is identified. No significant calcific changes are seen. Pulmonary artery demonstrates a normal branching pattern without definitive pulmonary embolus. Cardiac structures are within normal limits. No right heart strain is noted. Nonvascular: No hilar or mediastinal adenopathy is seen. The thoracic inlet is within normal limits. The lungs are well-aerated without focal infiltrate or sizable effusion. Degenerative changes of the thoracic spine are noted. Review of the MIP images confirms the above findings. CTA ABDOMEN AND PELVIS FINDINGS Vascular: The abdominal aorta shows no aneurysmal dilatation or dissection. A normal branching pattern is seen with the visceral vessels being widely patent. No significant atherosclerotic calcifications are seen. Single renal arteries are identified bilaterally. The left renal artery distally demonstrates multifocal aneurysmal dilatation. Two areas are noted 1 more superiorly which measures 18 mm in greatest dimension 1 slightly more inferior measuring 16 mm in dimension. No obstruction of flow is seen. Mild thrombus is noted within the aneurysms. Nonvascular: The liver, spleen, adrenal glands and pancreas are within normal limits. The gallbladder has been surgically removed. The appendix is within normal limits. Fecal material is noted throughout the colon. No obstructive changes are noted. No free air or free fluid is noted within the pelvis. Uterus has been surgically removed. No acute bony abnormality is noted. Multilevel lumbar degenerative changes are seen. Mild thickening of  the distal esophagus is noted which may represent esophagitis. Review of the MIP images confirms the above findings. IMPRESSION: CTA of the chest: Truncus anomaly of the thoracic aorta. No evidence of aneurysmal dilatation or dissection. 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 No aortic aneurysm or dissection is seen. Mild thickening of the distal esophagus which may represent esophagitis. Electronically Signed   By: Inez Catalina M.D.   On: 04/09/2016 18:31    Microbiology: No results found for this or any previous visit (from the past 240 hour(s)).   Labs: Basic Metabolic Panel:  Recent Labs Lab 04/09/16 1033  04/09/16 1042 04/10/16 0852 04/11/16 0408 04/11/16 1651  NA 139  --  139 138 134*  K 4.4  --  3.7 3.9 3.8  CL 106  --  106 104 99*  CO2 22  --  26 27 27   GLUCOSE 229*  --  218* 217* 283*  BUN 11  --  8 6 <5*  CREATININE 1.41*  --  1.32* 1.21* 1.24*  CALCIUM 8.7*  --  8.1* 8.5* 8.8*  MG  --  1.3*  --  1.7  --    Liver Function Tests:  Recent Labs Lab 04/09/16 1033 04/10/16 0852 04/11/16 1651  AST 16 16 18   ALT 13* 13* 13*  ALKPHOS 72 59 69  BILITOT 0.4 0.3 0.4  PROT 7.6 6.5 7.4  ALBUMIN 3.3* 2.9* 3.2*    Recent Labs Lab 04/09/16 1042  LIPASE 18   No results for input(s): AMMONIA in the last 168 hours. CBC:  Recent Labs Lab 04/09/16 1033 04/10/16 0852 04/11/16 0408 04/12/16 0354  WBC 12.2* 7.8 8.9 9.3  NEUTROABS 8.5*  --   --   --   HGB 13.2 11.9* 11.5* 11.5*  HCT 41.1 38.0 36.5 36.0  MCV 91.7 91.8 91.5 90.5  PLT 214 200 196 209   Cardiac Enzymes:  Recent Labs Lab 04/09/16 1954 04/10/16 0210 04/10/16 0852  TROPONINI <0.03 <0.03 <0.03   BNP: BNP (last 3 results)  Recent Labs  04/09/16 1043  BNP 58.2    ProBNP (last 3 results) No results for input(s): PROBNP in the last 8760 hours.  CBG:  Recent Labs Lab 04/11/16 1150 04/11/16 1637 04/11/16 2118 04/12/16 0811 04/12/16 1112   GLUCAP 205* 244* 165* 219* 352*       Signed:  Saveah Bahar MD, PhD  [facility] 04/12/2016, 4:06 PM

## 2016-06-26 ENCOUNTER — Other Ambulatory Visit: Payer: Self-pay | Admitting: Cardiology

## 2016-06-26 DIAGNOSIS — Z1231 Encounter for screening mammogram for malignant neoplasm of breast: Secondary | ICD-10-CM

## 2016-07-25 ENCOUNTER — Encounter (HOSPITAL_COMMUNITY): Payer: Self-pay | Admitting: *Deleted

## 2016-07-25 ENCOUNTER — Emergency Department (HOSPITAL_COMMUNITY): Payer: Medicare HMO

## 2016-07-25 ENCOUNTER — Emergency Department (HOSPITAL_COMMUNITY)
Admission: EM | Admit: 2016-07-25 | Discharge: 2016-07-25 | Disposition: A | Discharge status: Home / Self Care | Payer: Medicare HMO | Attending: Emergency Medicine | Admitting: Emergency Medicine

## 2016-07-25 DIAGNOSIS — I110 Hypertensive heart disease with heart failure: Secondary | ICD-10-CM | POA: Diagnosis not present

## 2016-07-25 DIAGNOSIS — R739 Hyperglycemia, unspecified: Secondary | ICD-10-CM

## 2016-07-25 DIAGNOSIS — E1165 Type 2 diabetes mellitus with hyperglycemia: Secondary | ICD-10-CM | POA: Insufficient documentation

## 2016-07-25 DIAGNOSIS — J449 Chronic obstructive pulmonary disease, unspecified: Secondary | ICD-10-CM | POA: Insufficient documentation

## 2016-07-25 DIAGNOSIS — Z794 Long term (current) use of insulin: Secondary | ICD-10-CM | POA: Diagnosis not present

## 2016-07-25 DIAGNOSIS — R072 Precordial pain: Secondary | ICD-10-CM | POA: Diagnosis not present

## 2016-07-25 DIAGNOSIS — R079 Chest pain, unspecified: Secondary | ICD-10-CM

## 2016-07-25 DIAGNOSIS — Z79899 Other long term (current) drug therapy: Secondary | ICD-10-CM | POA: Diagnosis not present

## 2016-07-25 DIAGNOSIS — I5032 Chronic diastolic (congestive) heart failure: Secondary | ICD-10-CM | POA: Diagnosis not present

## 2016-07-25 LAB — COMPREHENSIVE METABOLIC PANEL
ALT: 11 U/L — ABNORMAL LOW (ref 14–54)
AST: 20 U/L (ref 15–41)
Albumin: 3 g/dL — ABNORMAL LOW (ref 3.5–5.0)
Alkaline Phosphatase: 75 U/L (ref 38–126)
Anion gap: 4 — ABNORMAL LOW (ref 5–15)
BUN: 12 mg/dL (ref 6–20)
CO2: 31 mmol/L (ref 22–32)
Calcium: 9 mg/dL (ref 8.9–10.3)
Chloride: 97 mmol/L — ABNORMAL LOW (ref 101–111)
Creatinine, Ser: 1.62 mg/dL — ABNORMAL HIGH (ref 0.44–1.00)
GFR calc Af Amer: 39 mL/min — ABNORMAL LOW (ref >60)
GFR calc non Af Amer: 33 mL/min — ABNORMAL LOW (ref >60)
Glucose, Bld: 738 mg/dL (ref 65–99)
Potassium: 4.5 mmol/L (ref 3.5–5.1)
Sodium: 132 mmol/L — ABNORMAL LOW (ref 135–145)
Total Bilirubin: 0.4 mg/dL (ref 0.3–1.2)
Total Protein: 7.1 g/dL (ref 6.5–8.1)

## 2016-07-25 LAB — CBC WITH DIFFERENTIAL/PLATELET
Basophils Absolute: 0 10*3/uL (ref 0.0–0.1)
Basophils Relative: 0 %
Eosinophils Absolute: 0.2 10*3/uL (ref 0.0–0.7)
Eosinophils Relative: 3 %
HCT: 37.4 % (ref 36.0–46.0)
Hemoglobin: 12.4 g/dL (ref 12.0–15.0)
Lymphocytes Relative: 23 %
Lymphs Abs: 1.7 10*3/uL (ref 0.7–4.0)
MCH: 28.5 pg (ref 26.0–34.0)
MCHC: 33.2 g/dL (ref 30.0–36.0)
MCV: 86 fL (ref 78.0–100.0)
Monocytes Absolute: 0.6 10*3/uL (ref 0.1–1.0)
Monocytes Relative: 9 %
Neutro Abs: 4.7 10*3/uL (ref 1.7–7.7)
Neutrophils Relative %: 65 %
Platelets: 178 10*3/uL (ref 150–400)
RBC: 4.35 MIL/uL (ref 3.87–5.11)
RDW: 12.1 % (ref 11.5–15.5)
WBC: 7.2 10*3/uL (ref 4.0–10.5)

## 2016-07-25 LAB — CBG MONITORING, ED
Glucose-Capillary: >600 mg/dL (ref 65–99)
Glucose-Capillary: 469 mg/dL — ABNORMAL HIGH (ref 65–99)

## 2016-07-25 LAB — I-STAT TROPONIN, ED: Troponin i, poc: 0 ng/mL (ref 0.00–0.08)

## 2016-07-25 MED ORDER — SODIUM CHLORIDE 0.9 % IV BOLUS (SEPSIS)
1000.0000 mL | Freq: Once | INTRAVENOUS | Status: AC
  Administered 2016-07-25: 1000 mL via INTRAVENOUS

## 2016-07-25 MED ORDER — INSULIN ASPART 100 UNIT/ML ~~LOC~~ SOLN
10.0000 [IU] | Freq: Once | SUBCUTANEOUS | Status: AC
  Administered 2016-07-25: 10 [IU] via INTRAVENOUS
  Filled 2016-07-25: qty 1

## 2016-07-25 MED ORDER — ASPIRIN 81 MG PO CHEW
324.0000 mg | CHEWABLE_TABLET | Freq: Once | ORAL | Status: DC
  Filled 2016-07-25: qty 4

## 2016-07-25 NOTE — ED Provider Notes (Signed)
5:33 PM Care assumed from Dr. Laneta Simmers.   At time of signout, patient is awaiting reassessment after fluids and insulin. Patient had elevated glucose however this was not her primary complaint. Plan as outlined by previous team is to reassess patient and if glucose is less than 500, plan to discharge patient for outpatient management of her elevated sugars and chest pain.  CBG was ordered after fluids.  6:59 PM On reassessment, glucose is now 469 and below the 500 Mark as outlined before. Patient reports no other complaints and will follow-up with her PCP for further glucose management. Patient and family understood strict return precautions for new or worsening symptoms. Patient had no other questions or concerns and patient was discharged in good condition. Patient encouraged to stay hydrated to prevent further kidney injury.    Clinical Impression: 1. Nonspecific chest pain   2. Hyperglycemia without ketosis     Disposition: Discharge  Condition: Good  I have discussed the results, Dx and Tx plan with the pt(& family if present). He/she/they expressed understanding and agree(s) with the plan. Discharge instructions discussed at great length. Strict return precautions discussed and pt &/or family have verbalized understanding of the instructions. No further questions at time of discharge.    New Prescriptions   No medications on file    Follow Up: No follow-up provider specified.    Gwenyth Allegra Caren Garske, MD 07/26/16 [phone]

## 2016-07-25 NOTE — ED Notes (Signed)
Pt given ice water, per Jenny Reichmann, Therapist, sports.

## 2016-07-25 NOTE — Discharge Instructions (Addendum)
Please take your glucose medications as previously directed. Please stay hydrated. Please follow-up with your PCP for further glucose management. If symptoms return or worsen, please return to the nearest emergency department.

## 2016-07-25 NOTE — ED Notes (Signed)
EKG given to Dr. Laneta Simmers

## 2016-07-25 NOTE — ED Provider Notes (Signed)
[facility] DEPT Provider Note   CSN: [id number] Arrival date & time: 07/25/16  1208     History   Chief Complaint Chief Complaint  Patient presents with  . Chest Pain    HPI [name] is a 60 y.o. female.  The history is provided by the patient.  Chest Pain   This is a recurrent problem. The current episode started yesterday. The problem occurs constantly. The problem has not changed since onset.The pain is associated with rest and movement. The pain is present in the substernal region. The pain is moderate. The quality of the pain is described as sharp. Radiates to: arms and neck. Duration of episode(s) is 1 day. The symptoms are aggravated by certain positions and deep breathing (palpation). Associated symptoms include malaise/fatigue and weakness. Pertinent negatives include no cough and no shortness of breath. She has tried nothing for the symptoms. Risk factors: diabetes, obesity.    Past Medical History:  Diagnosis Date  . Allergic rhinitis   . Arthritis   . Asthma   . Chronic diastolic CHF (congestive heart failure) ([place])   . COPD (chronic obstructive pulmonary disease) ([place])   . Depression   . DM (diabetes mellitus) ([place])   . DVT (deep vein thrombosis) in pregnancy ([place])   . Dysrhythmia    atrial fibrilation  . GERD (gastroesophageal reflux disease)   . Headache(784.0)   . HTN (hypertension)   . Hyperlipidemia   . Obesity   . Shortness of breath   . Sleep apnea    compliant with CPAP    Patient Active Problem List   Diagnosis Date Noted  . Nausea vomiting and diarrhea   . Chest pain 02/26/2012  . OSA (obstructive sleep apnea) 09/21/2011  . Hypoxia 09/21/2011  . Diabetes mellitus ([place]) 02/04/2010  . Morbid obesity ([place]) 02/04/2010  . Atrial fibrillation ([place]) 02/04/2010  . GASTROPARESIS 02/04/2010  . GASTROENTERITIS 02/04/2010    Past Surgical History:  Procedure Laterality Date  . ABDOMINAL HYSTERECTOMY    . CHOLECYSTECTOMY    .  COLONOSCOPY WITH PROPOFOL N/A 03/05/2015   Procedure: COLONOSCOPY WITH PROPOFOL;  Surgeon: Carol Ada, MD;  Location: [facility] ENDOSCOPY;  Service: Endoscopy;  Laterality: N/A;  . DIAGNOSTIC LAPAROSCOPY    . ingrown hallux Left   . KNEE SURGERY      OB History    No data available       Home Medications    Prior to Admission medications   Medication Sig Start Date End Date Taking? Authorizing Provider  camphor-menthol (ANTI-ITCH) lotion Apply 1 application topically as needed for itching.   Yes Historical Provider, MD  carvedilol (COREG) 12.5 MG tablet Take 12.5 mg by mouth 2 (two) times daily with a meal.   Yes Historical Provider, MD  clorazepate (TRANXENE) 7.5 MG tablet Take 7.5 mg by mouth daily.   Yes Historical Provider, MD  dabigatran (PRADAXA) 150 MG CAPS capsule Take 1 capsule (150 mg total) by mouth every 12 (twelve) hours. 04/12/16  Yes Florencia Reasons, MD  digoxin (LANOXIN) 0.25 MG tablet Take 0.5 tablets (0.125 mg total) by mouth daily. 03/21/15  Yes Dixie Dials, MD  diphenhydrAMINE (BENADRYL) 25 MG tablet Take 25 mg by mouth every 4 (four) hours as needed for itching.   Yes Historical Provider, MD  furosemide (LASIX) 40 MG tablet Take 1 tablet (40 mg total) by mouth daily as needed for fluid or edema. 04/12/16  Yes Florencia Reasons, MD  gabapentin (NEURONTIN) 300 MG capsule Take 300 mg by  mouth at bedtime.  09/13/11  Yes Historical Provider, MD  insulin aspart (NOVOLOG) 100 UNIT/ML injection Inject 10 Units into the skin 2 (two) times daily.    Yes Historical Provider, MD  insulin detemir (LEVEMIR) 100 UNIT/ML injection Inject 30 Units into the skin daily.    Yes Historical Provider, MD  losartan (COZAAR) 25 MG tablet Take 1 tablet (25 mg total) by mouth daily. 04/12/16  Yes Florencia Reasons, MD  Multiple Vitamins-Minerals (MULTIVITAMIN WOMEN PO) Take 1 tablet by mouth daily.   Yes Historical Provider, MD  omeprazole (PRILOSEC) 40 MG capsule Take 40 mg by mouth every morning.  08/30/11  Yes Historical  Provider, MD  PATANOL 0.1 % ophthalmic solution Place 1 drop into both eyes 2 (two) times daily.  09/13/11  Yes Historical Provider, MD  PROAIR HFA 108 (90 BASE) MCG/ACT inhaler Inhale 2 puffs into the lungs every 4 (four) hours as needed for wheezing (wheezing).  05/28/12  Yes Historical Provider, MD  rosuvastatin (CRESTOR) 10 MG tablet Take 10 mg by mouth daily with breakfast.   Yes Historical Provider, MD  sitaGLIPtin (JANUVIA) 50 MG tablet Take 50 mg by mouth daily with breakfast.   Yes Historical Provider, MD  spironolactone (ALDACTONE) 25 MG tablet Take 25 mg by mouth daily.  09/13/11  Yes Historical Provider, MD  nitroGLYCERIN (NITROSTAT) 0.4 MG SL tablet Place 1 tablet (0.4 mg total) under the tongue every 5 (five) minutes x 3 doses as needed for chest pain. 01/08/14   Charolette Forward, MD    Family History Family History  Problem Relation Age of Onset  . Cancer Mother   . Hypertension Mother   . Cancer Father   . CAD    . Hypertension Sister   . Hypertension Brother     Social History Social History  Substance Use Topics  . Smoking status: Never Smoker  . Smokeless tobacco: Never Used  . Alcohol use No     Allergies   Nsaids and Sulfa antibiotics   Review of Systems Review of Systems  Constitutional: Positive for malaise/fatigue.  Respiratory: Negative for cough and shortness of breath.   Cardiovascular: Positive for chest pain.  Neurological: Positive for weakness.  All other systems reviewed and are negative.    Physical Exam Updated Vital Signs BP 121/60   Pulse 93   Temp 97.8 F (36.6 C) (Oral)   Resp 26   Ht 5\' 7"  (1.702 m)   Wt (!) 330 lb (149.7 kg)   SpO2 95%   BMI 51.69 kg/m   Physical Exam  Constitutional: She is oriented to person, place, and time. She appears well-developed and well-nourished. No distress.  HENT:  Head: Normocephalic.  Nose: Nose normal.  Eyes: Conjunctivae are normal. Pupils are equal, round, and reactive to light.  Neck:  Neck supple. No tracheal deviation present.  Cardiovascular: Normal rate, regular rhythm and normal heart sounds.   Pulmonary/Chest: Effort normal and breath sounds normal. No respiratory distress. She exhibits tenderness (mid-sternal).  Abdominal: Soft. She exhibits no distension. There is no tenderness.  Neurological: She is alert and oriented to person, place, and time.  Skin: Skin is warm and dry.  Psychiatric: She has a normal mood and affect.     ED Treatments / Results  Labs (all labs ordered are listed, but only abnormal results are displayed) Labs Reviewed  COMPREHENSIVE METABOLIC PANEL - Abnormal; Notable for the following:       Result Value   Sodium 132 (*)  Chloride 97 (*)    Glucose, Bld 738 (*)    Creatinine, Ser 1.62 (*)    Albumin 3.0 (*)    ALT 11 (*)    GFR calc non Af Amer 33 (*)    GFR calc Af Amer 39 (*)    Anion gap 4 (*)    All other components within normal limits  CBG MONITORING, ED - Abnormal; Notable for the following:    Glucose-Capillary >600 (*)    All other components within normal limits  CBC WITH DIFFERENTIAL/PLATELET  I-STAT TROPOININ, ED    EKG  EKG Interpretation  Date/Time:  Tuesday July 25 2016 12:17:24 EST Ventricular Rate:  86 PR Interval:    QRS Duration: 99 QT Interval:  479 QTC Calculation: 573 R Axis:   -28 Text Interpretation:  Atrial fibrillation Inferior infarct, old Anterior infarct, age indeterminate No significant change since last tracing Confirmed by Yaret Hush MD, Kreed Kauffman [phone]) on 07/25/2016 1:54:52 PM       Radiology Dg Chest 2 View  Result Date: 07/25/2016 CLINICAL DATA:  Chest pain, shortness of Breath EXAM: CHEST  2 VIEW COMPARISON:  04/09/2016 FINDINGS: Cardiomegaly. No infiltrate or pleural effusion. No pulmonary edema. Bony thorax is unremarkable. IMPRESSION: No active cardiopulmonary disease. Electronically Signed   By: Lahoma Crocker M.D.   On: 07/25/2016 14:37    Procedures Procedures (including  critical care time)  Medications Ordered in ED Medications  insulin aspart (novoLOG) injection 10 Units (10 Units Intravenous Given 07/25/16 1551)  sodium chloride 0.9 % bolus 1,000 mL (1,000 mLs Intravenous New Bag/Given 07/25/16 1552)     Initial Impression / Assessment and Plan / ED Course  I have reviewed the triage vital signs and the nursing notes.  Pertinent labs & imaging results that were available during my care of the patient were reviewed by me and considered in my medical decision making (see chart for details).  Clinical Course     60 y.o. female presents with chest pain that started last night. She has multiple atypical features including reproducibility of the chest pain and radiation of her aches to the lower half of her body. It does not appear to be exertional, she has no risk factors for PE and no clinical symptoms to suggest this. She has had ongoing malaise and has been noncompliant with her medications today including her insulin. Her glucose is reading very high which is likely contributing to her malaise symptoms. She needs to get better control of her blood sugar and was instructed to do so but she has no evidence of DKA either clinically or on lab work today. Administered IV insulin and IV fluids for likely secondary dehydration with hyperglycemia and plan will be to correct at home. Troponin is negative despite greater than 6 hours of ongoing pain and EKG shows no ST or T-wave changes to suggest ongoing myocardial ischemia so at this time I feel the patient is appropriately low risk stratified for ACS with a heart score of 3.  Final Clinical Impressions(s) / ED Diagnoses   Final diagnoses:  Nonspecific chest pain  Hyperglycemia without ketosis    New Prescriptions New Prescriptions   No medications on file     Leo Grosser, MD 07/25/16 [phone]

## 2016-07-25 NOTE — ED Notes (Signed)
Pt stable, understands discharge instructions, and reasons for return.   

## 2016-07-25 NOTE — ED Triage Notes (Signed)
Per [facility] around 10:30pm last night, gradual onset of heaviness and radiating chest pain.  Radiating to left arm and neck. Dizziness and weakness also.  VS as follows: Spo2: 98% BP: 104/63 HR: 68-90 (AFib) CBG: above 600. No Insulin or any meds today.  1 nitro in route. 8 out of 10

## 2016-07-25 NOTE — ED Notes (Signed)
Pt's CBG 469.  Informed John, Therapist, sports.

## 2016-07-25 NOTE — ED Notes (Signed)
Pt's CBG "HI:  >600."  Informed Estill Bamberg, RN.

## 2017-01-28 ENCOUNTER — Encounter (HOSPITAL_COMMUNITY): Payer: Self-pay | Admitting: Emergency Medicine

## 2017-01-28 ENCOUNTER — Emergency Department (HOSPITAL_COMMUNITY): Payer: Medicare HMO

## 2017-01-28 ENCOUNTER — Emergency Department (HOSPITAL_COMMUNITY)
Admission: EM | Admit: 2017-01-28 | Discharge: 2017-01-29 | Disposition: A | Discharge status: Home / Self Care | Payer: Medicare HMO | Attending: Physician Assistant | Admitting: Physician Assistant

## 2017-01-28 DIAGNOSIS — R0789 Other chest pain: Secondary | ICD-10-CM | POA: Insufficient documentation

## 2017-01-28 DIAGNOSIS — R072 Precordial pain: Secondary | ICD-10-CM | POA: Diagnosis not present

## 2017-01-28 DIAGNOSIS — I110 Hypertensive heart disease with heart failure: Secondary | ICD-10-CM | POA: Diagnosis not present

## 2017-01-28 DIAGNOSIS — Z79899 Other long term (current) drug therapy: Secondary | ICD-10-CM | POA: Insufficient documentation

## 2017-01-28 DIAGNOSIS — Z794 Long term (current) use of insulin: Secondary | ICD-10-CM | POA: Diagnosis not present

## 2017-01-28 DIAGNOSIS — I5032 Chronic diastolic (congestive) heart failure: Secondary | ICD-10-CM | POA: Diagnosis not present

## 2017-01-28 DIAGNOSIS — R404 Transient alteration of awareness: Secondary | ICD-10-CM | POA: Diagnosis not present

## 2017-01-28 DIAGNOSIS — J449 Chronic obstructive pulmonary disease, unspecified: Secondary | ICD-10-CM | POA: Insufficient documentation

## 2017-01-28 DIAGNOSIS — I509 Heart failure, unspecified: Secondary | ICD-10-CM | POA: Diagnosis not present

## 2017-01-28 DIAGNOSIS — R531 Weakness: Secondary | ICD-10-CM | POA: Insufficient documentation

## 2017-01-28 DIAGNOSIS — J45909 Unspecified asthma, uncomplicated: Secondary | ICD-10-CM | POA: Insufficient documentation

## 2017-01-28 DIAGNOSIS — R200 Anesthesia of skin: Secondary | ICD-10-CM | POA: Diagnosis not present

## 2017-01-28 DIAGNOSIS — E119 Type 2 diabetes mellitus without complications: Secondary | ICD-10-CM | POA: Diagnosis not present

## 2017-01-28 DIAGNOSIS — R079 Chest pain, unspecified: Secondary | ICD-10-CM | POA: Diagnosis not present

## 2017-01-28 LAB — CBC
HCT: 41 % (ref 36.0–46.0)
Hemoglobin: 12.9 g/dL (ref 12.0–15.0)
MCH: 27 pg (ref 26.0–34.0)
MCHC: 31.5 g/dL (ref 30.0–36.0)
MCV: 85.8 fL (ref 78.0–100.0)
Platelets: 220 10*3/uL (ref 150–400)
RBC: 4.78 MIL/uL (ref 3.87–5.11)
RDW: 13.3 % (ref 11.5–15.5)
WBC: 8.8 10*3/uL (ref 4.0–10.5)

## 2017-01-28 LAB — BASIC METABOLIC PANEL
Anion gap: 11 (ref 5–15)
BUN: 22 mg/dL — ABNORMAL HIGH (ref 6–20)
CO2: 26 mmol/L (ref 22–32)
Calcium: 9.1 mg/dL (ref 8.9–10.3)
Chloride: 99 mmol/L — ABNORMAL LOW (ref 101–111)
Creatinine, Ser: 1.94 mg/dL — ABNORMAL HIGH (ref 0.44–1.00)
GFR calc Af Amer: 31 mL/min — ABNORMAL LOW (ref >60)
GFR calc non Af Amer: 27 mL/min — ABNORMAL LOW (ref >60)
Glucose, Bld: 242 mg/dL — ABNORMAL HIGH (ref 65–99)
Potassium: 4.2 mmol/L (ref 3.5–5.1)
Sodium: 136 mmol/L (ref 135–145)

## 2017-01-28 LAB — I-STAT TROPONIN, ED: Troponin i, poc: 0.01 ng/mL (ref 0.00–0.08)

## 2017-01-28 NOTE — ED Notes (Addendum)
Pt A&O x3 and then in the middle of asking questions pt had a blank stare, looked away and was unable to answer questions/delayed speech and started tisking her tongue. When asked pt to smile, pt had a difficult time forming a smile, slight droop on the right side. Episode lasted about 3-4 minutes. EDP aware. Pt started answering questions at normal rate again.

## 2017-01-28 NOTE — ED Notes (Signed)
ED Provider at bedside. 

## 2017-01-28 NOTE — ED Triage Notes (Signed)
Per EMS, pt from home. Pt c/o generalized weakness for the past week which has increased today, burning in extremities on left side and numbness on right side extremities. Pt reports left sided sharp chest pain 9/10. Pt has had a non-productive cough the last week, was febrile earlier this week. EMS VS BP 130/70, HR 90, RR 16, 97% room air and CBG 177.

## 2017-01-28 NOTE — ED Notes (Signed)
Patient transported to X-ray 

## 2017-01-29 ENCOUNTER — Emergency Department (HOSPITAL_COMMUNITY): Payer: Medicare HMO

## 2017-01-29 DIAGNOSIS — R200 Anesthesia of skin: Secondary | ICD-10-CM | POA: Diagnosis not present

## 2017-01-29 DIAGNOSIS — R0789 Other chest pain: Secondary | ICD-10-CM | POA: Diagnosis not present

## 2017-01-29 LAB — DIGOXIN LEVEL: Digoxin Level: 1.2 ng/mL (ref 0.8–2.0)

## 2017-01-29 LAB — D-DIMER, QUANTITATIVE: D-Dimer, Quant: <0.27 ug/mL-FEU (ref 0.00–0.50)

## 2017-01-29 LAB — I-STAT TROPONIN, ED: Troponin i, poc: 0 ng/mL (ref 0.00–0.08)

## 2017-01-29 MED ORDER — MORPHINE SULFATE (PF) 4 MG/ML IV SOLN
4.0000 mg | Freq: Once | INTRAVENOUS | Status: AC
  Administered 2017-01-29: 4 mg via INTRAVENOUS
  Filled 2017-01-29: qty 1

## 2017-01-29 MED ORDER — LORAZEPAM 2 MG/ML IJ SOLN
0.5000 mg | Freq: Once | INTRAMUSCULAR | Status: AC
  Administered 2017-01-29: 0.5 mg via INTRAVENOUS
  Filled 2017-01-29: qty 1

## 2017-01-29 MED ORDER — ONDANSETRON HCL 4 MG/2ML IJ SOLN
4.0000 mg | Freq: Once | INTRAMUSCULAR | Status: AC
  Administered 2017-01-29: 4 mg via INTRAVENOUS
  Filled 2017-01-29: qty 2

## 2017-01-29 NOTE — ED Notes (Signed)
Ambulated in hall using cane.  Tolerated well.

## 2017-01-29 NOTE — Discharge Instructions (Signed)
Your workup and imaging has been reassuring.  please make sure you follow-up with her primary care doctor this week. Her kidney function was slightly elevated and needs to be rechecked this week. Drink plenty of fluids. Avoid any NSAIDs including Motrin, Aleve, ibuprofen, Advil. If he develops any worsening symptoms please return to the ED.

## 2017-01-29 NOTE — ED Notes (Signed)
Taken to MRI 

## 2017-01-29 NOTE — ED Notes (Signed)
Family at bedside. 

## 2017-01-30 NOTE — ED Provider Notes (Signed)
[facility] DEPT Provider Note   CSN: [id number] Arrival date & time: 01/28/17  2143     History   Chief Complaint Chief Complaint  Patient presents with  . Weakness  . Chest Pain    HPI [name] is a 61 y.o. female.  HPI 61 year old female past medical history significant for COPD, CHF, DVT, diabetes, hypertension, obesity that presents to the emergency department by EMS with several complaints. First patient complains of generalized weakness for the past week which has increased today. She also notes burning extremities and the left side and numbness in her right foot that progressed up to her right leg onset earlier this morning. Patient denies any headache or vision changes. She denies any difficulties walking, facial asymmetry, difficulty speaking. The patient also reports left-sided chest pain that is sharp in nature. The pain does not radiate. Rates the pain a 9 out of 10. Patient has a nonproductive cough over the past week. States that she was febrile earlier in the week but denies any fever today. She endorses mild shortness of breath with this episode. She denies any diaphoresis, nausea, emesis. Patient has been seen several times in the ED for same for chest pain. She had a normal stress test in August of last year. Patient denies any lower extremity edema or calf tenderness. She denies any recent hospitalizations/surgeries, prolonged immobilization, tobacco use, hormone use. Denies any fever, chills, headache, vision changes, neck stiffness, abdominal pain, nausea, emesis, urinary symptoms, change in bowel habits. Past Medical History:  Diagnosis Date  . Allergic rhinitis   . Arthritis   . Asthma   . Chronic diastolic CHF (congestive heart failure) ([place])   . COPD (chronic obstructive pulmonary disease) ([place])   . Depression   . DM (diabetes mellitus) ([place])   . DVT (deep vein thrombosis) in pregnancy ([place])   . Dysrhythmia    atrial fibrilation  . GERD  (gastroesophageal reflux disease)   . Headache(784.0)   . HTN (hypertension)   . Hyperlipidemia   . Obesity   . Shortness of breath   . Sleep apnea    compliant with CPAP    Patient Active Problem List   Diagnosis Date Noted  . Nausea vomiting and diarrhea   . Chest pain 02/26/2012  . OSA (obstructive sleep apnea) 09/21/2011  . Hypoxia 09/21/2011  . Diabetes mellitus ([place]) 02/04/2010  . Morbid obesity ([place]) 02/04/2010  . Atrial fibrillation ([place]) 02/04/2010  . GASTROPARESIS 02/04/2010  . GASTROENTERITIS 02/04/2010    Past Surgical History:  Procedure Laterality Date  . ABDOMINAL HYSTERECTOMY    . CHOLECYSTECTOMY    . COLONOSCOPY WITH PROPOFOL N/A 03/05/2015   Procedure: COLONOSCOPY WITH PROPOFOL;  Surgeon: Carol Ada, MD;  Location: [facility] ENDOSCOPY;  Service: Endoscopy;  Laterality: N/A;  . DIAGNOSTIC LAPAROSCOPY    . ingrown hallux Left   . KNEE SURGERY      OB History    No data available       Home Medications    Prior to Admission medications   Medication Sig Start Date End Date Taking? Authorizing Provider  camphor-menthol (ANTI-ITCH) lotion Apply 1 application topically as needed for itching.   Yes [provider]  carvedilol (COREG) 12.5 MG tablet Take 12.5 mg by mouth 2 (two) times daily with a meal.   Yes [provider]  clorazepate (TRANXENE) 7.5 MG tablet Take 7.5 mg by mouth daily.   Yes [provider]  dabigatran (PRADAXA) 150 MG CAPS capsule Take 1 capsule (  150 mg total) by mouth every 12 (twelve) hours. 04/12/16  Yes Florencia Reasons, MD  digoxin (LANOXIN) 0.25 MG tablet Take 0.5 tablets (0.125 mg total) by mouth daily. 03/21/15  Yes Dixie Dials, MD  diphenhydrAMINE (BENADRYL) 25 MG tablet Take 25 mg by mouth every 4 (four) hours as needed for itching.   Yes [provider]  furosemide (LASIX) 40 MG tablet Take 1 tablet (40 mg total) by mouth daily as needed for fluid or edema. 04/12/16  Yes Florencia Reasons, MD  gabapentin  (NEURONTIN) 300 MG capsule Take 300 mg by mouth at bedtime.  09/13/11  Yes [provider]  insulin aspart (NOVOLOG) 100 UNIT/ML injection Inject 25 Units into the skin 2 (two) times daily.    Yes [provider]  insulin detemir (LEVEMIR) 100 UNIT/ML injection Inject 30 Units into the skin daily.    Yes [provider]  losartan (COZAAR) 25 MG tablet Take 1 tablet (25 mg total) by mouth daily. 04/12/16  Yes Florencia Reasons, MD  Multiple Vitamins-Minerals (MULTIVITAMIN WOMEN PO) Take 1 tablet by mouth daily.   Yes [provider]  nitroGLYCERIN (NITROSTAT) 0.4 MG SL tablet Place 1 tablet (0.4 mg total) under the tongue every 5 (five) minutes x 3 doses as needed for chest pain. 01/08/14  Yes Charolette Forward, MD  omeprazole (PRILOSEC) 40 MG capsule Take 40 mg by mouth every morning.  08/30/11  Yes [provider]  PROAIR HFA 108 (90 BASE) MCG/ACT inhaler Inhale 2 puffs into the lungs every 4 (four) hours as needed for wheezing (wheezing).  05/28/12  Yes [provider]  rosuvastatin (CRESTOR) 10 MG tablet Take 10 mg by mouth daily with breakfast.   Yes [provider]  sitaGLIPtin (JANUVIA) 50 MG tablet Take 50 mg by mouth daily with breakfast.   Yes [provider]  spironolactone (ALDACTONE) 25 MG tablet Take 25 mg by mouth daily.  09/13/11  Yes [provider]    Family History Family History  Problem Relation Age of Onset  . Cancer Mother   . Hypertension Mother   . Cancer Father   . CAD Unknown   . Hypertension Sister   . Hypertension Brother     Social History Social History  Substance Use Topics  . Smoking status: Never Smoker  . Smokeless tobacco: Never Used  . Alcohol use No     Allergies   Nsaids and Sulfa antibiotics   Review of Systems Review of Systems  Constitutional: Negative for chills and fever.  HENT: Negative for congestion.   Eyes: Negative for visual disturbance.  Respiratory: Positive for  shortness of breath. Negative for cough.   Cardiovascular: Positive for chest pain. Negative for palpitations and leg swelling.  Gastrointestinal: Negative for abdominal pain, diarrhea, nausea and vomiting.  Genitourinary: Negative for dysuria, frequency, hematuria and urgency.  Musculoskeletal: Negative for arthralgias and back pain.  Skin: Negative for color change and rash.  Neurological: Positive for numbness. Negative for dizziness, seizures, syncope, weakness, light-headedness and headaches.  All other systems reviewed and are negative.    Physical Exam Updated Vital Signs BP 122/88 (BP Location: Right Arm)   Pulse 88   Temp 98.7 F (37.1 C) (Oral)   Resp 18   Ht 5\' 7"  (1.702 m)   Wt 136.1 kg (300 lb)   SpO2 96%   BMI 46.99 kg/m   Physical Exam  Constitutional: She is oriented to person, place, and time. She appears well-developed and well-nourished. No distress.  Nontoxic-appearing. Chronically ill-appearing. Morbid obesity.  HENT:  Head: Normocephalic and atraumatic.  Mouth/Throat: Oropharynx is clear and moist.  Eyes: Conjunctivae and EOM are normal. Pupils are equal, round, and reactive to light. Right eye exhibits no discharge. Left eye exhibits no discharge. No scleral icterus.  Neck: Normal range of motion. Neck supple. No thyromegaly present.  Cardiovascular: Normal rate, regular rhythm, normal heart sounds and intact distal pulses.   Pulmonary/Chest: Effort normal and breath sounds normal.  Abdominal: Soft. Bowel sounds are normal. She exhibits no distension. There is no tenderness. There is no rebound and no guarding.  Musculoskeletal: Normal range of motion.  No lower extremity edema or calf tenderness.  Lymphadenopathy:    She has no cervical adenopathy.  Neurological: She is alert and oriented to person, place, and time.  The patient is alert, attentive, and oriented x 3. Speech is clear. Cranial nerve II-VII grossly intact. Negative pronator drift.  Sensation intact. Strength 5/5 in all extremities. Reflexes 2+ and symmetric at biceps, triceps, knees, and ankles. Rapid alternating movement and fine finger movements intact.    Skin: Skin is warm and dry. Capillary refill takes less than 2 seconds.  Nursing note and vitals reviewed.    ED Treatments / Results  Labs (all labs ordered are listed, but only abnormal results are displayed) Labs Reviewed  BASIC METABOLIC PANEL - Abnormal; Notable for the following:       Result Value   Chloride 99 (*)    Glucose, Bld 242 (*)    BUN 22 (*)    Creatinine, Ser 1.94 (*)    GFR calc non Af Amer 27 (*)    GFR calc Af Amer 31 (*)    All other components within normal limits  CBC  DIGOXIN LEVEL  D-DIMER, QUANTITATIVE (NOT AT [facility])  I-STAT TROPOININ, ED  I-STAT TROPOININ, ED  I-STAT TROPOININ, ED    EKG  EKG Interpretation  Date/Time:  Sunday January 28 2017 21:56:39 EDT Ventricular Rate:  101 PR Interval:    QRS Duration: 120 QT Interval:  347 QTC Calculation: 450 R Axis:   -141 Text Interpretation:  Atrial fibrillation IVCD, consider atypical RBBB Inferior infarct, old Anterior infarct, old Atrial fibrillation Confirmed by Zenovia Jarred [id number]) on 01/29/2017 4:55:16 AM Also confirmed by Thomasene Lot, Courtene[place] [phone]), editor Hattie Perch ([id number])  on 01/29/2017 8:01:53 AM       Radiology Dg Chest 2 View  Result Date: 01/28/2017 CLINICAL DATA:  Initial evaluation for acute chest pain. EXAM: CHEST  2 VIEW COMPARISON:  Prior radiograph from 07/25/2016. FINDINGS: Cardiomegaly, stable.  Mediastinal silhouette normal. Lungs normally inflated. No focal infiltrates. No pulmonary edema or pleural effusion. No pneumothorax. No acute osseus abnormality. IMPRESSION: 1. No active cardiopulmonary disease. 2. Stable cardiomegaly. Electronically Signed   By: Jeannine Boga M.D.   On: 01/28/2017 23:36   Ct Head Wo Contrast  Result Date: 01/29/2017 CLINICAL DATA:  Initial evaluation for  acute weakness. EXAM: CT HEAD WITHOUT CONTRAST TECHNIQUE: Contiguous axial images were obtained from the base of the skull through the vertex without intravenous contrast. COMPARISON:  Prior CT from 11/14/2013. FINDINGS: Brain: Cerebral volume within normal limits for patient age. No evidence for acute intracranial hemorrhage. No findings to suggest acute large vessel territory infarct. No mass lesion, midline shift, or mass effect. Ventricles are normal in size without evidence for hydrocephalus. No extra-axial fluid collection identified. Vascular: No hyperdense vessel identified. Skull: Scalp soft tissues demonstrate no acute abnormality.Calvarium intact. Sinuses/Orbits: Globes and orbital  soft tissues are within normal limits. Visualized paranasal sinuses are clear. No mastoid effusion. IMPRESSION: Normal head CT.  No acute intracranial process identified. Electronically Signed   By: Jeannine Boga M.D.   On: 01/29/2017 01:22   Mr Brain Wo Contrast  Result Date: 01/29/2017 CLINICAL DATA:  Initial evaluation for extremity numbness. EXAM: MRI HEAD WITHOUT CONTRAST TECHNIQUE: Multiplanar, multiecho pulse sequences of the brain and surrounding structures were obtained without intravenous contrast. COMPARISON:  Prior CT from earlier same day. FINDINGS: Brain: Cerebral volume within normal limits. Few tiny subcentimeter patchy T2/FLAIR hyperintense foci noted within the supratentorial white matter, nonspecific, but felt to be within normal limits for age. No abnormal foci of restricted diffusion to suggest acute or subacute ischemia. No evidence for chronic infarction. No evidence for acute or chronic intracranial hemorrhage. No mass lesion, midline shift or mass effect. Ventricles normal size without evidence for hydrocephalus. No extra-axial fluid collection. Major dural sinuses are grossly patent. Pituitary gland mildly prominent with convex border superiorly without discrete lesion. Suprasellar region  normal. Midline structures intact and normal. Vascular: Major intracranial vascular flow voids are maintained. Skull and upper cervical spine: Craniocervical junction normal. Visualized upper cervical spine unremarkable. Bone marrow signal intensity normal. No scalp soft tissue abnormality. Sinuses/Orbits: Globes and orbital soft tissues within normal limits. Paranasal sinuses are clear. Trace bilateral mastoid effusions noted, of doubtful significance. Inner ear structures normal. IMPRESSION: Normal brain MRI.  No acute intracranial process identified. Electronically Signed   By: Jeannine Boga M.D.   On: 01/29/2017 04:43    Procedures Procedures (including critical care time)  Medications Ordered in ED Medications  morphine 4 MG/ML injection 4 mg (4 mg Intravenous Given 01/29/17 0226)  ondansetron (ZOFRAN) injection 4 mg (4 mg Intravenous Given 01/29/17 0226)  LORazepam (ATIVAN) injection 0.5 mg (0.5 mg Intravenous Given 01/29/17 0301)     Initial Impression / Assessment and Plan / ED Course  I have reviewed the triage vital signs and the nursing notes.  Pertinent labs & imaging results that were available during my care of the patient were reviewed by me and considered in my medical decision making (see chart for details).     Patient presents to the ED with multiple complaints including chest pain and lower extremity numbness and burning. Patient is overall well-appearing. She has no focal neuro deficits on exam. Lab work is overall reassuring. She does have a mild elevation in her creatinine of 1.9 up from baseline of 1.6. No leukocytosis. Negative delta troponins. EKG shows A. fib which patient has history of same and is on blood thinners and incomplete right bundle-branch with history of same no ischemia noted. Doubt patient's symptoms are due to ACS. Normal stress test August of last year. Patients presented several times in the ED for chest pain without any known cause. The patient's  pain only slightly improved in the ED. D-dimer was negative. Doubt PE or dissection. Digoxin level is normal. X-ray shows stable cardiomegaly. Head CT without any acute findings.  Nurse notes that patient did have an episode where Pt A&O x3 and then in the middle of asking questions pt had a blank stare, looked away and was unable to answer questions/delayed speech and started tisking her tongue. When asked pt to smile, pt had a difficult time forming a smile, slight droop on the right side. Episode lasted about 3-4 minutes. EDP aware. Pt started answering questions at normal rate again.   When I went to examine patient patient was  back at baseline. MRI was obtained that showed no acute findings. Doubt TIA. Patient ambulated in the hallway with cane without assistance. The patient's symptoms are chronic in nature. Do not feel there is any acute indications for admission at this time. Patient needs close follow with her PCP and cardiologist. Patient was seen and evaluated by Dr. Sandi Mariscal who is agreeable to above plan.  Pt is hemodynamically stable, in NAD, & able to ambulate in the ED. Pain has been managed & has no complaints prior to dc. Pt is comfortable with above plan and is stable for discharge at this time. All questions were answered prior to disposition. Strict return precautions for f/u to the ED were discussed.   Final Clinical Impressions(s) / ED Diagnoses   Final diagnoses:  Atypical chest pain  Weakness    New Prescriptions Discharge Medication List as of 01/29/2017  6:17 AM       Doristine Devoid, PA-C 01/30/17 0849    Thomasene Lot, Fredia Sorrow, MD 02/01/17 [phone]

## 2017-02-01 DIAGNOSIS — H25813 Combined forms of age-related cataract, bilateral: Secondary | ICD-10-CM | POA: Diagnosis not present

## 2017-02-01 DIAGNOSIS — H43822 Vitreomacular adhesion, left eye: Secondary | ICD-10-CM | POA: Diagnosis not present

## 2017-02-01 DIAGNOSIS — E119 Type 2 diabetes mellitus without complications: Secondary | ICD-10-CM | POA: Diagnosis not present

## 2017-02-08 DIAGNOSIS — H2511 Age-related nuclear cataract, right eye: Secondary | ICD-10-CM | POA: Diagnosis not present

## 2017-02-27 DIAGNOSIS — I509 Heart failure, unspecified: Secondary | ICD-10-CM | POA: Diagnosis not present

## 2017-03-07 DIAGNOSIS — G4733 Obstructive sleep apnea (adult) (pediatric): Secondary | ICD-10-CM | POA: Diagnosis not present

## 2017-03-15 DIAGNOSIS — R0789 Other chest pain: Secondary | ICD-10-CM | POA: Diagnosis not present

## 2017-03-15 DIAGNOSIS — J449 Chronic obstructive pulmonary disease, unspecified: Secondary | ICD-10-CM | POA: Diagnosis not present

## 2017-03-15 DIAGNOSIS — K219 Gastro-esophageal reflux disease without esophagitis: Secondary | ICD-10-CM | POA: Diagnosis not present

## 2017-03-15 DIAGNOSIS — I482 Chronic atrial fibrillation: Secondary | ICD-10-CM | POA: Diagnosis not present

## 2017-03-15 DIAGNOSIS — I1310 Hypertensive heart and chronic kidney disease without heart failure, with stage 1 through stage 4 chronic kidney disease, or unspecified chronic kidney disease: Secondary | ICD-10-CM | POA: Diagnosis not present

## 2017-03-15 DIAGNOSIS — M1990 Unspecified osteoarthritis, unspecified site: Secondary | ICD-10-CM | POA: Diagnosis not present

## 2017-03-15 DIAGNOSIS — N189 Chronic kidney disease, unspecified: Secondary | ICD-10-CM | POA: Diagnosis not present

## 2017-03-15 DIAGNOSIS — E119 Type 2 diabetes mellitus without complications: Secondary | ICD-10-CM | POA: Diagnosis not present

## 2017-03-15 DIAGNOSIS — E785 Hyperlipidemia, unspecified: Secondary | ICD-10-CM | POA: Diagnosis not present

## 2017-03-30 DIAGNOSIS — I509 Heart failure, unspecified: Secondary | ICD-10-CM | POA: Diagnosis not present

## 2017-04-24 ENCOUNTER — Ambulatory Visit: Payer: Medicare Other

## 2017-04-26 ENCOUNTER — Encounter (INDEPENDENT_AMBULATORY_CARE_PROVIDER_SITE_OTHER): Payer: Self-pay

## 2017-04-26 NOTE — Patient Instructions (Signed)
[name] , Thank you for taking time to come for your Medicare Wellness Visit. I appreciate your ongoing commitment to your health goals. Please review the following plan we discussed and let me know if I can assist you in the future.   Screening recommendations/referrals: Colonoscopy: up to date, next due 03/04/2025 Mammogram: due Bone Density: N/A, starts at 54 Recommended yearly ophthalmology/optometry visit for glaucoma screening and checkup Recommended yearly dental visit for hygiene and checkup  Vaccinations: Influenza vaccine:  Pneumococcal vaccine: After age 6 Tdap vaccine: due Shingles vaccine: due    Advanced directives:   Conditions/risks identified:   Next appointment:   Preventive Care 40-64 Years, Female Preventive care refers to lifestyle choices and visits with your health care provider that can promote health and wellness. What does preventive care include?  A yearly physical exam. This is also called an annual well check.  Dental exams once or twice a year.  Routine eye exams. Ask your health care provider how often you should have your eyes checked.  Personal lifestyle choices, including:  Daily care of your teeth and gums.  Regular physical activity.  Eating a healthy diet.  Avoiding tobacco and drug use.  Limiting alcohol use.  Practicing safe sex.  Taking low-dose aspirin daily starting at age 44.  Taking vitamin and mineral supplements as recommended by your health care provider. What happens during an annual well check? The services and screenings done by your health care provider during your annual well check will depend on your age, overall health, lifestyle risk factors, and family history of disease. Counseling  Your health care provider may ask you questions about your:  Alcohol use.  Tobacco use.  Drug use.  Emotional well-being.  Home and relationship well-being.  Sexual activity.  Eating habits.  Work and work  Statistician.  Method of birth control.  Menstrual cycle.  Pregnancy history. Screening  You may have the following tests or measurements:  Height, weight, and BMI.  Blood pressure.  Lipid and cholesterol levels. These may be checked every 5 years, or more frequently if you are over 64 years old.  Skin check.  Lung cancer screening. You may have this screening every year starting at age 23 if you have a 30-pack-year history of smoking and currently smoke or have quit within the past 15 years.  Fecal occult blood test (FOBT) of the stool. You may have this test every year starting at age 63.  Flexible sigmoidoscopy or colonoscopy. You may have a sigmoidoscopy every 5 years or a colonoscopy every 10 years starting at age 63.  Hepatitis C blood test.  Hepatitis B blood test.  Sexually transmitted disease (STD) testing.  Diabetes screening. This is done by checking your blood sugar (glucose) after you have not eaten for a while (fasting). You may have this done every 1-3 years.  Mammogram. This may be done every 1-2 years. Talk to your health care provider about when you should start having regular mammograms. This may depend on whether you have a family history of breast cancer.  BRCA-related cancer screening. This may be done if you have a family history of breast, ovarian, tubal, or peritoneal cancers.  Pelvic exam and Pap test. This may be done every 3 years starting at age 75. Starting at age 59, this may be done every 5 years if you have a Pap test in combination with an HPV test.  Bone density scan. This is done to screen for osteoporosis. You may have  this scan if you are at high risk for osteoporosis. Discuss your test results, treatment options, and if necessary, the need for more tests with your health care provider. Vaccines  Your health care provider may recommend certain vaccines, such as:  Influenza vaccine. This is recommended every year.  Tetanus, diphtheria,  and acellular pertussis (Tdap, Td) vaccine. You may need a Td booster every 10 years.  Zoster vaccine. You may need this after age 50.  Pneumococcal 13-valent conjugate (PCV13) vaccine. You may need this if you have certain conditions and were not previously vaccinated.  Pneumococcal polysaccharide (PPSV23) vaccine. You may need one or two doses if you smoke cigarettes or if you have certain conditions. Talk to your health care provider about which screenings and vaccines you need and how often you need them. This information is not intended to replace advice given to you by your health care provider. Make sure you discuss any questions you have with your health care provider. Document Released: 09/03/2015 Document Revised: 04/26/2016 Document Reviewed: 06/08/2015 Elsevier Interactive Patient Education  2017 [place] Prevention in the Home Falls can cause injuries. They can happen to people of all ages. There are many things you can do to make your home safe and to help prevent falls. What can I do on the outside of my home?  Regularly fix the edges of walkways and driveways and fix any cracks.  Remove anything that might make you trip as you walk through a door, such as a raised step or threshold.  Trim any bushes or trees on the path to your home.  Use bright outdoor lighting.  Clear any walking paths of anything that might make someone trip, such as rocks or tools.  Regularly check to see if handrails are loose or broken. Make sure that both sides of any steps have handrails.  Any raised decks and porches should have guardrails on the edges.  Have any leaves, snow, or ice cleared regularly.  Use sand or salt on walking paths during winter.  Clean up any spills in your garage right away. This includes oil or grease spills. What can I do in the bathroom?  Use night lights.  Install grab bars by the toilet and in the tub and shower. Do not use towel bars as grab  bars.  Use non-skid mats or decals in the tub or shower.  If you need to sit down in the shower, use a plastic, non-slip stool.  Keep the floor dry. Clean up any water that spills on the floor as soon as it happens.  Remove soap buildup in the tub or shower regularly.  Attach bath mats securely with double-sided non-slip rug tape.  Do not have throw rugs and other things on the floor that can make you trip. What can I do in the bedroom?  Use night lights.  Make sure that you have a light by your bed that is easy to reach.  Do not use any sheets or blankets that are too big for your bed. They should not hang down onto the floor.  Have a firm chair that has side arms. You can use this for support while you get dressed.  Do not have throw rugs and other things on the floor that can make you trip. What can I do in the kitchen?  Clean up any spills right away.  Avoid walking on wet floors.  Keep items that you use a lot in easy-to-reach places.  If you need to reach something above you, use a strong step stool that has a grab bar.  Keep electrical cords out of the way.  Do not use floor polish or wax that makes floors slippery. If you must use wax, use non-skid floor wax.  Do not have throw rugs and other things on the floor that can make you trip. What can I do with my stairs?  Do not leave any items on the stairs.  Make sure that there are handrails on both sides of the stairs and use them. Fix handrails that are broken or loose. Make sure that handrails are as long as the stairways.  Check any carpeting to make sure that it is firmly attached to the stairs. Fix any carpet that is loose or worn.  Avoid having throw rugs at the top or bottom of the stairs. If you do have throw rugs, attach them to the floor with carpet tape.  Make sure that you have a light switch at the top of the stairs and the bottom of the stairs. If you do not have them, ask someone to add them for  you. What else can I do to help prevent falls?  Wear shoes that:  Do not have high heels.  Have rubber bottoms.  Are comfortable and fit you well.  Are closed at the toe. Do not wear sandals.  If you use a stepladder:  Make sure that it is fully opened. Do not climb a closed stepladder.  Make sure that both sides of the stepladder are locked into place.  Ask someone to hold it for you, if possible.  Clearly mark and make sure that you can see:  Any grab bars or handrails.  First and last steps.  Where the edge of each step is.  Use tools that help you move around (mobility aids) if they are needed. These include:  Canes.  Walkers.  Scooters.  Crutches.  Turn on the lights when you go into a dark area. Replace any light bulbs as soon as they burn out.  Set up your furniture so you have a clear path. Avoid moving your furniture around.  If any of your floors are uneven, fix them.  If there are any pets around you, be aware of where they are.  Review your medicines with your doctor. Some medicines can make you feel dizzy. This can increase your chance of falling. Ask your doctor what other things that you can do to help prevent falls. This information is not intended to replace advice given to you by your health care provider. Make sure you discuss any questions you have with your health care provider. Document Released: 06/03/2009 Document Revised: 01/13/2016 Document Reviewed: 09/11/2014 Elsevier Interactive Patient Education  2017 [employer].

## 2017-04-26 NOTE — Progress Notes (Signed)
Subjective:   Alinna Siple is a 61 y.o. female who presents for an Initial Medicare Annual Wellness Visit.  Review of Systems    N/A        Objective:    Today's Vitals   04/26/17 1117  BP: 132/77  Pulse: 73  Temp: (!) 97.1 F (36.2 C)  TempSrc: Oral  Weight: (!) 303 lb (137.4 kg)  Height: 5\' 7"  (1.702 m)   Body mass index is 47.46 kg/m.   Current Medications (verified) Outpatient Encounter Prescriptions as of 04/26/2017  Medication Sig  . camphor-menthol (ANTI-ITCH) lotion Apply 1 application topically as needed for itching.  . carvedilol (COREG) 12.5 MG tablet Take 12.5 mg by mouth 2 (two) times daily with a meal.  . clorazepate (TRANXENE) 7.5 MG tablet Take 7.5 mg by mouth daily.  . dabigatran (PRADAXA) 150 MG CAPS capsule Take 1 capsule (150 mg total) by mouth every 12 (twelve) hours.  . digoxin (LANOXIN) 0.25 MG tablet Take 0.5 tablets (0.125 mg total) by mouth daily.  . diphenhydrAMINE (BENADRYL) 25 MG tablet Take 25 mg by mouth every 4 (four) hours as needed for itching.  . furosemide (LASIX) 40 MG tablet Take 1 tablet (40 mg total) by mouth daily as needed for fluid or edema.  . gabapentin (NEURONTIN) 300 MG capsule Take 300 mg by mouth at bedtime.   . insulin aspart (NOVOLOG) 100 UNIT/ML injection Inject 25 Units into the skin 2 (two) times daily.   . insulin detemir (LEVEMIR) 100 UNIT/ML injection Inject 30 Units into the skin daily.   Marland Kitchen losartan (COZAAR) 25 MG tablet Take 1 tablet (25 mg total) by mouth daily.  . Multiple Vitamins-Minerals (MULTIVITAMIN WOMEN PO) Take 1 tablet by mouth daily.  . nitroGLYCERIN (NITROSTAT) 0.4 MG SL tablet Place 1 tablet (0.4 mg total) under the tongue every 5 (five) minutes x 3 doses as needed for chest pain.  Marland Kitchen omeprazole (PRILOSEC) 40 MG capsule Take 40 mg by mouth every morning.   Marland Kitchen PROAIR HFA 108 (90 BASE) MCG/ACT inhaler Inhale 2 puffs into the lungs every 4 (four) hours as needed for wheezing (wheezing).   .  rosuvastatin (CRESTOR) 10 MG tablet Take 10 mg by mouth daily with breakfast.  . sitaGLIPtin (JANUVIA) 50 MG tablet Take 50 mg by mouth daily with breakfast.  . spironolactone (ALDACTONE) 25 MG tablet Take 25 mg by mouth daily.    No facility-administered encounter medications on file as of 04/26/2017.     Allergies (verified) Nsaids and Sulfa antibiotics   History: Past Medical History:  Diagnosis Date  . Allergic rhinitis   . Arthritis   . Asthma   . Chronic diastolic CHF (congestive heart failure) ([place])   . COPD (chronic obstructive pulmonary disease) ([place])   . Depression   . DM (diabetes mellitus) ([place])   . DVT (deep vein thrombosis) in pregnancy ([place])   . Dysrhythmia    atrial fibrilation  . GERD (gastroesophageal reflux disease)   . Headache(784.0)   . HTN (hypertension)   . Hyperlipidemia   . Obesity   . Shortness of breath   . Sleep apnea    compliant with CPAP   Past Surgical History:  Procedure Laterality Date  . ABDOMINAL HYSTERECTOMY    . CHOLECYSTECTOMY    . COLONOSCOPY WITH PROPOFOL N/A 03/05/2015   Procedure: COLONOSCOPY WITH PROPOFOL;  Surgeon: Carol Ada, MD;  Location: [facility];  Service: Endoscopy;  Laterality: N/A;  . DIAGNOSTIC LAPAROSCOPY    . ingrown  hallux Left   . KNEE SURGERY     Family History  Problem Relation Age of Onset  . Cancer Mother   . Hypertension Mother   . Cancer Father   . CAD Unknown   . Hypertension Sister   . Hypertension Brother    Social History   Occupational History  . Not on file.   Social History Main Topics  . Smoking status: Never Smoker  . Smokeless tobacco: Never Used  . Alcohol use No  . Drug use: No  . Sexual activity: Not on file    Tobacco Counseling Counseling given: Not Answered   Activities of Daily Living No flowsheet data found.  Immunizations and Health Maintenance Immunization History  Administered Date(s) Administered  . Pneumococcal Polysaccharide-23 02/27/2012   Health  Maintenance Due  Topic Date Due  . FOOT EXAM  07/23/1966  . OPHTHALMOLOGY EXAM  07/23/1966  . HIV Screening  07/24/1971  . TETANUS/TDAP  07/24/1975  . PAP SMEAR  07/23/1977  . MAMMOGRAM  07/23/2006  . HEMOGLOBIN A1C  10/10/2016  . PNEUMOCOCCAL POLYSACCHARIDE VACCINE (2) 02/26/2017  . INFLUENZA VACCINE  03/21/2017    Patient Care Team: Wallene Huh, MD as PCP - General (Cardiology) Charolette Forward, MD as Referring Physician (Internal Medicine)  Indicate any recent Medical Services you may have received from other than [facility] providers in the past year (date may be approximate).     Assessment:   This is a routine wellness examination for Jane.   Hearing/Vision screen No exam data present  Dietary issues and exercise activities discussed:    Goals    None     Depression Screen No flowsheet data found.  Fall Risk No flowsheet data found.  Cognitive Function:        Screening Tests Health Maintenance  Topic Date Due  . FOOT EXAM  07/23/1966  . OPHTHALMOLOGY EXAM  07/23/1966  . HIV Screening  07/24/1971  . TETANUS/TDAP  07/24/1975  . PAP SMEAR  07/23/1977  . MAMMOGRAM  07/23/2006  . HEMOGLOBIN A1C  10/10/2016  . PNEUMOCOCCAL POLYSACCHARIDE VACCINE (2) 02/26/2017  . INFLUENZA VACCINE  03/21/2017  . COLONOSCOPY  03/04/2025  . Hepatitis C Screening  Completed      Plan:   I have personally reviewed and noted the following in the patient's chart:   . Medical and social history . Use of alcohol, tobacco or illicit drugs  . Current medications and supplements . Functional ability and status . Nutritional status . Physical activity . Advanced directives . List of other physicians . Hospitalizations, surgeries, and ER visits in previous 12 months . Vitals . Screenings to include cognitive, depression, and falls . Referrals and appointments  In addition, I have reviewed and discussed with patient certain preventive protocols, quality metrics, and best  practice recommendations. A written personalized care plan for preventive services as well as general preventive health recommendations were provided to patient.     Andrez Grime, LPN   03/27/7543       This encounter was created in error - please disregard.

## 2017-04-30 DIAGNOSIS — I509 Heart failure, unspecified: Secondary | ICD-10-CM | POA: Diagnosis not present

## 2017-05-30 DIAGNOSIS — I509 Heart failure, unspecified: Secondary | ICD-10-CM | POA: Diagnosis not present

## 2017-06-07 DIAGNOSIS — N189 Chronic kidney disease, unspecified: Secondary | ICD-10-CM | POA: Diagnosis not present

## 2017-06-07 DIAGNOSIS — I1310 Hypertensive heart and chronic kidney disease without heart failure, with stage 1 through stage 4 chronic kidney disease, or unspecified chronic kidney disease: Secondary | ICD-10-CM | POA: Diagnosis not present

## 2017-06-07 DIAGNOSIS — E1122 Type 2 diabetes mellitus with diabetic chronic kidney disease: Secondary | ICD-10-CM | POA: Diagnosis not present

## 2017-06-07 DIAGNOSIS — J449 Chronic obstructive pulmonary disease, unspecified: Secondary | ICD-10-CM | POA: Diagnosis not present

## 2017-06-07 DIAGNOSIS — R0789 Other chest pain: Secondary | ICD-10-CM | POA: Diagnosis not present

## 2017-06-07 DIAGNOSIS — I5030 Unspecified diastolic (congestive) heart failure: Secondary | ICD-10-CM | POA: Diagnosis not present

## 2017-06-07 DIAGNOSIS — I38 Endocarditis, valve unspecified: Secondary | ICD-10-CM | POA: Diagnosis not present

## 2017-06-07 DIAGNOSIS — I482 Chronic atrial fibrillation: Secondary | ICD-10-CM | POA: Diagnosis not present

## 2017-06-07 DIAGNOSIS — R0609 Other forms of dyspnea: Secondary | ICD-10-CM | POA: Diagnosis not present

## 2017-06-07 DIAGNOSIS — G4733 Obstructive sleep apnea (adult) (pediatric): Secondary | ICD-10-CM | POA: Diagnosis not present

## 2017-06-20 DIAGNOSIS — E785 Hyperlipidemia, unspecified: Secondary | ICD-10-CM | POA: Diagnosis not present

## 2017-06-20 DIAGNOSIS — E1122 Type 2 diabetes mellitus with diabetic chronic kidney disease: Secondary | ICD-10-CM | POA: Diagnosis not present

## 2017-06-20 DIAGNOSIS — N189 Chronic kidney disease, unspecified: Secondary | ICD-10-CM | POA: Diagnosis not present

## 2017-06-20 DIAGNOSIS — I5030 Unspecified diastolic (congestive) heart failure: Secondary | ICD-10-CM | POA: Diagnosis not present

## 2017-06-20 DIAGNOSIS — R079 Chest pain, unspecified: Secondary | ICD-10-CM | POA: Diagnosis not present

## 2017-06-20 DIAGNOSIS — I482 Chronic atrial fibrillation: Secondary | ICD-10-CM | POA: Diagnosis not present

## 2017-06-20 DIAGNOSIS — I38 Endocarditis, valve unspecified: Secondary | ICD-10-CM | POA: Diagnosis not present

## 2017-06-20 DIAGNOSIS — I1310 Hypertensive heart and chronic kidney disease without heart failure, with stage 1 through stage 4 chronic kidney disease, or unspecified chronic kidney disease: Secondary | ICD-10-CM | POA: Diagnosis not present

## 2017-06-20 DIAGNOSIS — R0609 Other forms of dyspnea: Secondary | ICD-10-CM | POA: Diagnosis not present

## 2017-06-30 DIAGNOSIS — I509 Heart failure, unspecified: Secondary | ICD-10-CM | POA: Diagnosis not present

## 2017-07-30 DIAGNOSIS — I509 Heart failure, unspecified: Secondary | ICD-10-CM | POA: Diagnosis not present

## 2017-08-30 DIAGNOSIS — I509 Heart failure, unspecified: Secondary | ICD-10-CM | POA: Diagnosis not present

## 2017-09-07 DIAGNOSIS — G4733 Obstructive sleep apnea (adult) (pediatric): Secondary | ICD-10-CM | POA: Diagnosis not present

## 2017-09-26 DIAGNOSIS — E119 Type 2 diabetes mellitus without complications: Secondary | ICD-10-CM | POA: Diagnosis not present

## 2017-09-26 DIAGNOSIS — Z961 Presence of intraocular lens: Secondary | ICD-10-CM | POA: Diagnosis not present

## 2017-09-26 DIAGNOSIS — H2513 Age-related nuclear cataract, bilateral: Secondary | ICD-10-CM | POA: Diagnosis not present

## 2017-09-27 DIAGNOSIS — N189 Chronic kidney disease, unspecified: Secondary | ICD-10-CM | POA: Diagnosis not present

## 2017-09-27 DIAGNOSIS — I1310 Hypertensive heart and chronic kidney disease without heart failure, with stage 1 through stage 4 chronic kidney disease, or unspecified chronic kidney disease: Secondary | ICD-10-CM | POA: Diagnosis not present

## 2017-09-27 DIAGNOSIS — M1990 Unspecified osteoarthritis, unspecified site: Secondary | ICD-10-CM | POA: Diagnosis not present

## 2017-09-27 DIAGNOSIS — E785 Hyperlipidemia, unspecified: Secondary | ICD-10-CM | POA: Diagnosis not present

## 2017-09-27 DIAGNOSIS — I5030 Unspecified diastolic (congestive) heart failure: Secondary | ICD-10-CM | POA: Diagnosis not present

## 2017-09-27 DIAGNOSIS — G4733 Obstructive sleep apnea (adult) (pediatric): Secondary | ICD-10-CM | POA: Diagnosis not present

## 2017-09-27 DIAGNOSIS — E1122 Type 2 diabetes mellitus with diabetic chronic kidney disease: Secondary | ICD-10-CM | POA: Diagnosis not present

## 2017-09-27 DIAGNOSIS — I482 Chronic atrial fibrillation: Secondary | ICD-10-CM | POA: Diagnosis not present

## 2017-09-28 DIAGNOSIS — E785 Hyperlipidemia, unspecified: Secondary | ICD-10-CM | POA: Diagnosis not present

## 2017-09-28 DIAGNOSIS — E119 Type 2 diabetes mellitus without complications: Secondary | ICD-10-CM | POA: Diagnosis not present

## 2017-09-28 DIAGNOSIS — E559 Vitamin D deficiency, unspecified: Secondary | ICD-10-CM | POA: Diagnosis not present

## 2017-09-28 DIAGNOSIS — I10 Essential (primary) hypertension: Secondary | ICD-10-CM | POA: Diagnosis not present

## 2017-09-30 DIAGNOSIS — I509 Heart failure, unspecified: Secondary | ICD-10-CM | POA: Diagnosis not present

## 2017-10-07 DIAGNOSIS — H2512 Age-related nuclear cataract, left eye: Secondary | ICD-10-CM | POA: Diagnosis not present

## 2017-10-10 DIAGNOSIS — H2512 Age-related nuclear cataract, left eye: Secondary | ICD-10-CM | POA: Diagnosis not present

## 2017-10-11 DIAGNOSIS — H2512 Age-related nuclear cataract, left eye: Secondary | ICD-10-CM | POA: Diagnosis not present

## 2017-10-28 DIAGNOSIS — I509 Heart failure, unspecified: Secondary | ICD-10-CM | POA: Diagnosis not present

## 2017-11-02 DIAGNOSIS — G4733 Obstructive sleep apnea (adult) (pediatric): Secondary | ICD-10-CM | POA: Diagnosis not present

## 2017-11-02 DIAGNOSIS — I5030 Unspecified diastolic (congestive) heart failure: Secondary | ICD-10-CM | POA: Diagnosis not present

## 2017-11-02 DIAGNOSIS — E1122 Type 2 diabetes mellitus with diabetic chronic kidney disease: Secondary | ICD-10-CM | POA: Diagnosis not present

## 2017-11-02 DIAGNOSIS — I1310 Hypertensive heart and chronic kidney disease without heart failure, with stage 1 through stage 4 chronic kidney disease, or unspecified chronic kidney disease: Secondary | ICD-10-CM | POA: Diagnosis not present

## 2017-11-02 DIAGNOSIS — N189 Chronic kidney disease, unspecified: Secondary | ICD-10-CM | POA: Diagnosis not present

## 2017-11-02 DIAGNOSIS — E785 Hyperlipidemia, unspecified: Secondary | ICD-10-CM | POA: Diagnosis not present

## 2017-11-02 DIAGNOSIS — I482 Chronic atrial fibrillation: Secondary | ICD-10-CM | POA: Diagnosis not present

## 2017-11-02 DIAGNOSIS — R0789 Other chest pain: Secondary | ICD-10-CM | POA: Diagnosis not present

## 2017-11-02 DIAGNOSIS — M1990 Unspecified osteoarthritis, unspecified site: Secondary | ICD-10-CM | POA: Diagnosis not present

## 2017-11-28 DIAGNOSIS — I509 Heart failure, unspecified: Secondary | ICD-10-CM | POA: Diagnosis not present

## 2017-12-26 ENCOUNTER — Emergency Department (HOSPITAL_COMMUNITY): Payer: Medicare HMO

## 2017-12-26 ENCOUNTER — Encounter (HOSPITAL_COMMUNITY): Payer: Self-pay | Admitting: Emergency Medicine

## 2017-12-26 ENCOUNTER — Inpatient Hospital Stay (HOSPITAL_COMMUNITY)
Admission: EM | Admit: 2017-12-26 | Discharge: 2018-01-01 | DRG: 287 | Disposition: A | Discharge status: Home / Self Care | Payer: Medicare HMO | Attending: Internal Medicine | Admitting: Internal Medicine

## 2017-12-26 DIAGNOSIS — E669 Obesity, unspecified: Secondary | ICD-10-CM | POA: Diagnosis present

## 2017-12-26 DIAGNOSIS — N839 Noninflammatory disorder of ovary, fallopian tube and broad ligament, unspecified: Secondary | ICD-10-CM | POA: Diagnosis present

## 2017-12-26 DIAGNOSIS — I482 Chronic atrial fibrillation, unspecified: Secondary | ICD-10-CM | POA: Diagnosis present

## 2017-12-26 DIAGNOSIS — Z888 Allergy status to other drugs, medicaments and biological substances status: Secondary | ICD-10-CM

## 2017-12-26 DIAGNOSIS — R0602 Shortness of breath: Secondary | ICD-10-CM | POA: Diagnosis not present

## 2017-12-26 DIAGNOSIS — Z7902 Long term (current) use of antithrombotics/antiplatelets: Secondary | ICD-10-CM | POA: Diagnosis not present

## 2017-12-26 DIAGNOSIS — Z8249 Family history of ischemic heart disease and other diseases of the circulatory system: Secondary | ICD-10-CM | POA: Diagnosis not present

## 2017-12-26 DIAGNOSIS — Z794 Long term (current) use of insulin: Secondary | ICD-10-CM | POA: Diagnosis not present

## 2017-12-26 DIAGNOSIS — F329 Major depressive disorder, single episode, unspecified: Secondary | ICD-10-CM | POA: Diagnosis present

## 2017-12-26 DIAGNOSIS — E1169 Type 2 diabetes mellitus with other specified complication: Secondary | ICD-10-CM | POA: Diagnosis present

## 2017-12-26 DIAGNOSIS — K210 Gastro-esophageal reflux disease with esophagitis: Secondary | ICD-10-CM | POA: Diagnosis present

## 2017-12-26 DIAGNOSIS — E119 Type 2 diabetes mellitus without complications: Secondary | ICD-10-CM | POA: Diagnosis not present

## 2017-12-26 DIAGNOSIS — N183 Chronic kidney disease, stage 3 (moderate): Secondary | ICD-10-CM | POA: Diagnosis present

## 2017-12-26 DIAGNOSIS — I130 Hypertensive heart and chronic kidney disease with heart failure and stage 1 through stage 4 chronic kidney disease, or unspecified chronic kidney disease: Secondary | ICD-10-CM | POA: Diagnosis present

## 2017-12-26 DIAGNOSIS — Z90710 Acquired absence of both cervix and uterus: Secondary | ICD-10-CM | POA: Diagnosis not present

## 2017-12-26 DIAGNOSIS — Z9049 Acquired absence of other specified parts of digestive tract: Secondary | ICD-10-CM | POA: Diagnosis not present

## 2017-12-26 DIAGNOSIS — I2510 Atherosclerotic heart disease of native coronary artery without angina pectoris: Secondary | ICD-10-CM | POA: Diagnosis present

## 2017-12-26 DIAGNOSIS — E1165 Type 2 diabetes mellitus with hyperglycemia: Secondary | ICD-10-CM | POA: Diagnosis present

## 2017-12-26 DIAGNOSIS — E1122 Type 2 diabetes mellitus with diabetic chronic kidney disease: Secondary | ICD-10-CM | POA: Diagnosis present

## 2017-12-26 DIAGNOSIS — Z6841 Body Mass Index (BMI) 40.0 and over, adult: Secondary | ICD-10-CM

## 2017-12-26 DIAGNOSIS — R0789 Other chest pain: Principal | ICD-10-CM | POA: Diagnosis present

## 2017-12-26 DIAGNOSIS — R079 Chest pain, unspecified: Secondary | ICD-10-CM | POA: Diagnosis present

## 2017-12-26 DIAGNOSIS — G4733 Obstructive sleep apnea (adult) (pediatric): Secondary | ICD-10-CM | POA: Diagnosis present

## 2017-12-26 DIAGNOSIS — I509 Heart failure, unspecified: Secondary | ICD-10-CM | POA: Diagnosis not present

## 2017-12-26 DIAGNOSIS — I4891 Unspecified atrial fibrillation: Secondary | ICD-10-CM | POA: Diagnosis not present

## 2017-12-26 DIAGNOSIS — I5032 Chronic diastolic (congestive) heart failure: Secondary | ICD-10-CM | POA: Diagnosis present

## 2017-12-26 DIAGNOSIS — Z86718 Personal history of other venous thrombosis and embolism: Secondary | ICD-10-CM

## 2017-12-26 DIAGNOSIS — M1990 Unspecified osteoarthritis, unspecified site: Secondary | ICD-10-CM | POA: Diagnosis present

## 2017-12-26 DIAGNOSIS — Z7901 Long term (current) use of anticoagulants: Secondary | ICD-10-CM | POA: Diagnosis not present

## 2017-12-26 DIAGNOSIS — I5041 Acute combined systolic (congestive) and diastolic (congestive) heart failure: Secondary | ICD-10-CM | POA: Diagnosis not present

## 2017-12-26 DIAGNOSIS — Z882 Allergy status to sulfonamides status: Secondary | ICD-10-CM | POA: Diagnosis not present

## 2017-12-26 DIAGNOSIS — E1159 Type 2 diabetes mellitus with other circulatory complications: Secondary | ICD-10-CM | POA: Diagnosis not present

## 2017-12-26 DIAGNOSIS — J449 Chronic obstructive pulmonary disease, unspecified: Secondary | ICD-10-CM | POA: Diagnosis present

## 2017-12-26 DIAGNOSIS — E785 Hyperlipidemia, unspecified: Secondary | ICD-10-CM | POA: Diagnosis present

## 2017-12-26 DIAGNOSIS — R197 Diarrhea, unspecified: Secondary | ICD-10-CM | POA: Diagnosis not present

## 2017-12-26 DIAGNOSIS — N189 Chronic kidney disease, unspecified: Secondary | ICD-10-CM | POA: Diagnosis not present

## 2017-12-26 DIAGNOSIS — R109 Unspecified abdominal pain: Secondary | ICD-10-CM | POA: Diagnosis not present

## 2017-12-26 DIAGNOSIS — I5189 Other ill-defined heart diseases: Secondary | ICD-10-CM

## 2017-12-26 DIAGNOSIS — E662 Morbid (severe) obesity with alveolar hypoventilation: Secondary | ICD-10-CM | POA: Diagnosis present

## 2017-12-26 DIAGNOSIS — I1310 Hypertensive heart and chronic kidney disease without heart failure, with stage 1 through stage 4 chronic kidney disease, or unspecified chronic kidney disease: Secondary | ICD-10-CM | POA: Diagnosis not present

## 2017-12-26 HISTORY — DX: Chest pain, unspecified: R07.9

## 2017-12-26 LAB — COMPREHENSIVE METABOLIC PANEL
ALT: 12 U/L — ABNORMAL LOW (ref 14–54)
AST: 16 U/L (ref 15–41)
Albumin: 3.4 g/dL — ABNORMAL LOW (ref 3.5–5.0)
Alkaline Phosphatase: 85 U/L (ref 38–126)
Anion gap: 8 (ref 5–15)
BUN: 6 mg/dL (ref 6–20)
CO2: 30 mmol/L (ref 22–32)
Calcium: 9.7 mg/dL (ref 8.9–10.3)
Chloride: 97 mmol/L — ABNORMAL LOW (ref 101–111)
Creatinine, Ser: 1.35 mg/dL — ABNORMAL HIGH (ref 0.44–1.00)
GFR calc Af Amer: 48 mL/min — ABNORMAL LOW (ref >60)
GFR calc non Af Amer: 41 mL/min — ABNORMAL LOW (ref >60)
Glucose, Bld: 376 mg/dL — ABNORMAL HIGH (ref 65–99)
Potassium: 4.4 mmol/L (ref 3.5–5.1)
Sodium: 135 mmol/L (ref 135–145)
Total Bilirubin: 0.6 mg/dL (ref 0.3–1.2)
Total Protein: 7.8 g/dL (ref 6.5–8.1)

## 2017-12-26 LAB — LIPASE, BLOOD: Lipase: 24 U/L (ref 11–51)

## 2017-12-26 LAB — CBC
HCT: 42.7 % (ref 36.0–46.0)
Hemoglobin: 14.4 g/dL (ref 12.0–15.0)
MCH: 28.3 pg (ref 26.0–34.0)
MCHC: 33.7 g/dL (ref 30.0–36.0)
MCV: 84.1 fL (ref 78.0–100.0)
Platelets: 265 10*3/uL (ref 150–400)
RBC: 5.08 MIL/uL (ref 3.87–5.11)
RDW: 13.7 % (ref 11.5–15.5)
WBC: 10.5 10*3/uL (ref 4.0–10.5)

## 2017-12-26 LAB — I-STAT TROPONIN, ED: Troponin i, poc: 0.01 ng/mL (ref 0.00–0.08)

## 2017-12-26 MED ORDER — MORPHINE SULFATE (PF) 4 MG/ML IV SOLN
2.0000 mg | INTRAVENOUS | Status: DC | PRN
  Administered 2017-12-27 – 2017-12-28 (×3): 2 mg via INTRAVENOUS
  Filled 2017-12-26 (×4): qty 1

## 2017-12-26 MED ORDER — CARVEDILOL 12.5 MG PO TABS
12.5000 mg | ORAL_TABLET | Freq: Two times a day (BID) | ORAL | Status: DC
  Administered 2017-12-27: 12.5 mg via ORAL
  Filled 2017-12-26: qty 1

## 2017-12-26 MED ORDER — ONDANSETRON HCL 4 MG/2ML IJ SOLN
4.0000 mg | Freq: Once | INTRAMUSCULAR | Status: AC
  Administered 2017-12-26: 4 mg via INTRAVENOUS
  Filled 2017-12-26: qty 2

## 2017-12-26 MED ORDER — ROSUVASTATIN CALCIUM 10 MG PO TABS
10.0000 mg | ORAL_TABLET | Freq: Every day | ORAL | Status: DC
  Administered 2017-12-27 – 2018-01-01 (×6): 10 mg via ORAL
  Filled 2017-12-26 (×6): qty 1

## 2017-12-26 MED ORDER — NITROGLYCERIN 0.4 MG SL SUBL
0.4000 mg | SUBLINGUAL_TABLET | SUBLINGUAL | Status: DC | PRN
  Administered 2017-12-29 (×4): 0.4 mg via SUBLINGUAL
  Filled 2017-12-26: qty 1

## 2017-12-26 MED ORDER — LOSARTAN POTASSIUM 50 MG PO TABS
50.0000 mg | ORAL_TABLET | Freq: Every day | ORAL | Status: DC
  Administered 2017-12-27 – 2018-01-01 (×6): 50 mg via ORAL
  Filled 2017-12-26 (×6): qty 1

## 2017-12-26 MED ORDER — INSULIN ASPART 100 UNIT/ML ~~LOC~~ SOLN
25.0000 [IU] | Freq: Two times a day (BID) | SUBCUTANEOUS | Status: DC
  Administered 2017-12-27: 25 [IU] via SUBCUTANEOUS
  Filled 2017-12-26: qty 1

## 2017-12-26 MED ORDER — CYCLOSPORINE 0.05 % OP EMUL
1.0000 [drp] | Freq: Two times a day (BID) | OPHTHALMIC | Status: DC
  Administered 2017-12-27 – 2018-01-01 (×11): 1 [drp] via OPHTHALMIC
  Filled 2017-12-26 (×11): qty 1

## 2017-12-26 MED ORDER — SODIUM CHLORIDE 0.9 % IV BOLUS
500.0000 mL | Freq: Once | INTRAVENOUS | Status: AC
  Administered 2017-12-26: 500 mL via INTRAVENOUS

## 2017-12-26 MED ORDER — PANTOPRAZOLE SODIUM 40 MG PO TBEC
40.0000 mg | DELAYED_RELEASE_TABLET | Freq: Every day | ORAL | Status: DC
Start: 2017-12-27 — End: 2017-12-27

## 2017-12-26 MED ORDER — ACETAMINOPHEN 325 MG PO TABS
650.0000 mg | ORAL_TABLET | ORAL | Status: DC | PRN
  Administered 2017-12-27 – 2018-01-01 (×6): 650 mg via ORAL
  Filled 2017-12-26 (×6): qty 2

## 2017-12-26 MED ORDER — DABIGATRAN ETEXILATE MESYLATE 150 MG PO CAPS
150.0000 mg | ORAL_CAPSULE | Freq: Two times a day (BID) | ORAL | Status: DC
  Administered 2017-12-27 – 2017-12-29 (×5): 150 mg via ORAL
  Filled 2017-12-26 (×5): qty 1

## 2017-12-26 MED ORDER — INSULIN ASPART 100 UNIT/ML ~~LOC~~ SOLN
0.0000 [IU] | Freq: Three times a day (TID) | SUBCUTANEOUS | Status: DC
  Administered 2017-12-27: 11 [IU] via SUBCUTANEOUS
  Administered 2017-12-27 (×2): 8 [IU] via SUBCUTANEOUS
  Administered 2017-12-28: 11 [IU] via SUBCUTANEOUS
  Administered 2017-12-28: 15 [IU] via SUBCUTANEOUS
  Administered 2017-12-29 (×2): 11 [IU] via SUBCUTANEOUS
  Administered 2017-12-29: 8 [IU] via SUBCUTANEOUS
  Administered 2017-12-30 (×2): 11 [IU] via SUBCUTANEOUS
  Administered 2017-12-30: 15 [IU] via SUBCUTANEOUS
  Administered 2017-12-31: 5 [IU] via SUBCUTANEOUS
  Administered 2017-12-31: 3 [IU] via SUBCUTANEOUS
  Administered 2017-12-31: 8 [IU] via SUBCUTANEOUS
  Filled 2017-12-26: qty 1

## 2017-12-26 MED ORDER — ASPIRIN EC 325 MG PO TBEC
325.0000 mg | DELAYED_RELEASE_TABLET | Freq: Every day | ORAL | Status: DC
  Administered 2017-12-27 – 2017-12-29 (×3): 325 mg via ORAL
  Filled 2017-12-26 (×3): qty 1

## 2017-12-26 MED ORDER — DIGOXIN 250 MCG PO TABS
0.2500 mg | ORAL_TABLET | Freq: Every day | ORAL | Status: DC
  Administered 2017-12-27 – 2018-01-01 (×6): 0.25 mg via ORAL
  Filled 2017-12-26 (×5): qty 1
  Filled 2017-12-26: qty 2

## 2017-12-26 MED ORDER — SPIRONOLACTONE 25 MG PO TABS
25.0000 mg | ORAL_TABLET | Freq: Every day | ORAL | Status: DC
  Administered 2017-12-28 – 2018-01-01 (×5): 25 mg via ORAL
  Filled 2017-12-26 (×5): qty 1

## 2017-12-26 MED ORDER — DULOXETINE HCL 60 MG PO CPEP
60.0000 mg | ORAL_CAPSULE | Freq: Every day | ORAL | Status: DC
  Administered 2017-12-27 – 2018-01-01 (×6): 60 mg via ORAL
  Filled 2017-12-26 (×6): qty 1

## 2017-12-26 MED ORDER — SODIUM CHLORIDE 0.9 % IV BOLUS: 1000.0000 mL | Freq: Once | INTRAVENOUS | Status: DC

## 2017-12-26 MED ORDER — HYDROMORPHONE HCL 2 MG/ML IJ SOLN
1.0000 mg | Freq: Once | INTRAMUSCULAR | Status: AC
  Administered 2017-12-26: 1 mg via INTRAVENOUS
  Filled 2017-12-26: qty 1

## 2017-12-26 MED ORDER — IOHEXOL 300 MG/ML  SOLN
100.0000 mL | Freq: Once | INTRAMUSCULAR | Status: AC | PRN
  Administered 2017-12-26: 100 mL via INTRAVENOUS

## 2017-12-26 MED ORDER — GABAPENTIN 300 MG PO CAPS
300.0000 mg | ORAL_CAPSULE | Freq: Two times a day (BID) | ORAL | Status: DC
  Administered 2017-12-27 – 2018-01-01 (×11): 300 mg via ORAL
  Filled 2017-12-26 (×11): qty 1

## 2017-12-26 MED ORDER — ALBUTEROL SULFATE (2.5 MG/3ML) 0.083% IN NEBU: 3.0000 mL | INHALATION_SOLUTION | RESPIRATORY_TRACT | Status: DC | PRN

## 2017-12-26 MED ORDER — INSULIN DETEMIR 100 UNIT/ML ~~LOC~~ SOLN
35.0000 [IU] | Freq: Every day | SUBCUTANEOUS | Status: DC
  Administered 2017-12-27 – 2018-01-01 (×6): 35 [IU] via SUBCUTANEOUS
  Filled 2017-12-26 (×6): qty 0.35

## 2017-12-26 MED ORDER — CLORAZEPATE DIPOTASSIUM 3.75 MG PO TABS
7.5000 mg | ORAL_TABLET | Freq: Every day | ORAL | Status: DC
  Administered 2017-12-28 – 2018-01-01 (×5): 7.5 mg via ORAL
  Filled 2017-12-26 (×5): qty 2

## 2017-12-26 MED ORDER — ONDANSETRON HCL 4 MG/2ML IJ SOLN: 4.0000 mg | Freq: Four times a day (QID) | INTRAMUSCULAR | Status: DC | PRN

## 2017-12-26 NOTE — ED Provider Notes (Signed)
[facility] EMERGENCY DEPARTMENT Provider Note   CSN: [id number] Arrival date & time: 12/26/17  1940     History   Chief Complaint Chief Complaint  Patient presents with  . Chest Pain    HPI [name] is a 62 y.o. female.  HPI   [name] is a 62yo female with a history of hypertension, hyperlipidemia, type 2 diabetes, atrial fibrillation, CHF (EF 50 to 55%), morbid obesity, DVT during pregnancy, asthma who presents to the emergency department via EMS for evaluation of abdominal pain and chest pain.  Patient reports that her abdominal pain began five days ago.  Pain is periumbilical and described as sharp.  Pain is present only after eating or drinking, and she reports having diarrhea shortly after eating or drinking anything.  States her pain is 9/10 in severity currently and worsened when she moves. She has had cholecystectomy and abdominal hysterectomy in the past. Denies recent antibiotic use or travel outside of the country.  She also reports "burning and pressure-like" substernal pain which also began about five days ago.  She states pain is constant, but seems to gotten worse today.  Pain does not radiate.  She states that she also feels somewhat short of breath and has felt sweaty at times. She denies fever, chills, nausea/vomiting, dysuria, urinary frequency, melena, hematochezia, cough, wheezing, numbness, weakness, lightheadedness, syncope. She had DVT during pregnancy and endorses bilateral leg swelling. No unilateral leg swelling, personal history of cancer, pleuritic chest pain, recent surgery or immobilization, active cancer. States her mother died of a heart attack at age 15yrs. Denies tobacco use or history of exertional cp.   Past Medical History:  Diagnosis Date  . Allergic rhinitis   . Arthritis   . Asthma   . Chronic diastolic CHF (congestive heart failure) ([place])   . COPD (chronic obstructive pulmonary disease) ([place])   . Depression    . DM (diabetes mellitus) ([place])   . DVT (deep vein thrombosis) in pregnancy ([place])   . Dysrhythmia    atrial fibrilation  . GERD (gastroesophageal reflux disease)   . Headache(784.0)   . HTN (hypertension)   . Hyperlipidemia   . Obesity   . Shortness of breath   . Sleep apnea    compliant with CPAP    Patient Active Problem List   Diagnosis Date Noted  . Nausea vomiting and diarrhea   . Chest pain 02/26/2012  . OSA (obstructive sleep apnea) 09/21/2011  . Hypoxia 09/21/2011  . Diabetes mellitus ([place]) 02/04/2010  . Morbid obesity ([place]) 02/04/2010  . Atrial fibrillation ([place]) 02/04/2010  . GASTROPARESIS 02/04/2010  . GASTROENTERITIS 02/04/2010    Past Surgical History:  Procedure Laterality Date  . ABDOMINAL HYSTERECTOMY    . CHOLECYSTECTOMY    . COLONOSCOPY WITH PROPOFOL N/A 03/05/2015   Procedure: COLONOSCOPY WITH PROPOFOL;  Surgeon: Carol Ada, MD;  Location: [facility] END[facility]COPY;  Service: Endoscopy;  Laterality: N/A;  . DIAGNOSTIC LAPAROSCOPY    . ingrown hallux Left   . KNEE SURGERY       OB History   None      Home Medications    Prior to Admission medications   Medication Sig Start Date End Date Taking? Authorizing Provider  carvedilol (COREG) 12.5 MG tablet Take 12.5 mg by mouth 2 (two) times daily with a meal.    [provider]  clorazepate (TRANXENE) 7.5 MG tablet Take 7.5 mg by mouth daily.    [provider]  dabigatran (PRADAXA) 150  MG CAPS capsule Take 1 capsule (150 mg total) by mouth every 12 (twelve) hours. 04/12/16   Florencia Reasons, MD  digoxin (LANOXIN) 0.25 MG tablet Take 0.5 tablets (0.125 mg total) by mouth daily. Patient taking differently: Take 0.25 mg by mouth daily.  03/21/15   Dixie Dials, MD  diphenhydrAMINE (BENADRYL) 25 MG tablet Take 25 mg by mouth every 4 (four) hours as needed for itching.    [provider]  DULoxetine (CYMBALTA) 60 MG capsule Take 60 mg by mouth daily.    [provider]  furosemide  (LASIX) 40 MG tablet Take 1 tablet (40 mg total) by mouth daily as needed for fluid or edema. 04/12/16   Florencia Reasons, MD  gabapentin (NEURONTIN) 300 MG capsule Take 300 mg by mouth at bedtime.  09/13/11   [provider]  insulin aspart (NOVOLOG) 100 UNIT/ML injection Inject 25 Units into the skin 2 (two) times daily.     [provider]  insulin detemir (LEVEMIR) 100 UNIT/ML injection Inject 35 Units into the skin daily.     [provider]  losartan (COZAAR) 50 MG tablet Take 50 mg by mouth daily.    [provider]  Multiple Vitamins-Minerals (MULTIVITAMIN WOMEN PO) Take 1 tablet by mouth daily.    [provider]  nitroGLYCERIN (NITROSTAT) 0.4 MG SL tablet Place 1 tablet (0.4 mg total) under the tongue every 5 (five) minutes x 3 doses as needed for chest pain. 01/08/14   Charolette Forward, MD  omeprazole (PRILOSEC) 40 MG capsule Take 40 mg by mouth every morning.  08/30/11   [provider]  PROAIR HFA 108 (90 BASE) MCG/ACT inhaler Inhale 2 puffs into the lungs every 4 (four) hours as needed for wheezing (wheezing).  05/28/12   [provider]  rosuvastatin (CRESTOR) 10 MG tablet Take 10 mg by mouth daily with breakfast.    [provider]  sitaGLIPtin (JANUVIA) 50 MG tablet Take 50 mg by mouth daily with breakfast.    [provider]  spironolactone (ALDACTONE) 25 MG tablet Take 25 mg by mouth daily.  09/13/11   [provider]    Family History Family History  Problem Relation Age of Onset  . Cancer Mother   . Hypertension Mother   . Cancer Father   . CAD Unknown   . Hypertension Sister   . Hypertension Brother     Social History Social History   Tobacco Use  . Smoking status: Never Smoker  . Smokeless tobacco: Never Used  Substance Use Topics  . Alcohol use: No  . Drug use: No     Allergies   Nsaids and Sulfa antibiotics   Review of Systems Review of Systems  Constitutional: Positive for  diaphoresis. Negative for chills and fever.  Eyes: Negative for visual disturbance.  Respiratory: Positive for shortness of breath. Negative for cough and wheezing.   Cardiovascular: Positive for chest pain and leg swelling (bilateral).  Gastrointestinal: Positive for abdominal pain and diarrhea. Negative for blood in stool, nausea and vomiting.  Genitourinary: Negative for difficulty urinating, dysuria, frequency and hematuria.  Musculoskeletal: Negative for gait problem.  Skin: Negative for rash.  Neurological: Negative for syncope, weakness, light-headedness and numbness.  Psychiatric/Behavioral: Negative for agitation.     Physical Exam Updated Vital Signs BP 129/79   Pulse (!) 101   Resp 11   SpO2 92%   Physical Exam  Constitutional: She is oriented to person, place, and time. She appears well-developed and well-nourished.  No distress.  No acute distress, appears comfortable at bedside.  HENT:  Head: Normocephalic and atraumatic.  Mouth/Throat: Oropharynx is clear and moist. No oropharyngeal exudate.  Eyes: Pupils are equal, round, and reactive to light. Conjunctivae are normal. Right eye exhibits no discharge. Left eye exhibits no discharge.  Neck: No JVD present. No tracheal deviation present.  Cardiovascular: Normal rate and intact distal pulses. Exam reveals no friction rub.  No murmur heard. Irregularly irregular rhythm. Normal rate.   Pulmonary/Chest: Effort normal and breath sounds normal. No stridor. No respiratory distress. She has no wheezes. She has no rales.  Abdominal:  Abdomen soft and nondistended.  Patient diffusely tender over the abdomen, although worse in the epigastrium and over the umbilicus.  No guarding or rigidity.  No rebound tenderness.  Musculoskeletal:  No unilateral leg swelling. No pitting edema in the legs.  Neurological: She is alert and oriented to person, place, and time. Coordination normal.  Skin: Skin is warm and dry. Capillary refill  takes less than 2 seconds. She is not diaphoretic.  Psychiatric: She has a normal mood and affect. Her behavior is normal.  Nursing note and vitals reviewed.    ED Treatments / Results  Labs (all labs ordered are listed, but only abnormal results are displayed) Labs Reviewed  COMPREHENSIVE METABOLIC PANEL - Abnormal; Notable for the following components:      Result Value   Chloride 97 (*)    Glucose, Bld 376 (*)    Creatinine, Ser 1.35 (*)    Albumin 3.4 (*)    ALT 12 (*)    GFR calc non Af Amer 41 (*)    GFR calc Af Amer 48 (*)    All other components within normal limits  LIPASE, BLOOD  CBC  URINALYSIS, ROUTINE W REFLEX MICROSCOPIC  I-STAT TROPONIN, ED  I-STAT TROPONIN, ED    EKG EKG Interpretation  Date/Time:  Wednesday Dec 26 2017 19:56:05 EDT Ventricular Rate:  97 PR Interval:    QRS Duration: 105 QT Interval:  360 QTC Calculation: 458 R Axis:   -43 Text Interpretation:  Atrial fibrillation Ventricular premature complex Inferior infarct, old Anteroseptal infarct, age indeterminate Artifact Abnormal ekg Confirmed by Carmin Muskrat [phone]) on 12/26/2017 8:21:53 PM   Radiology Dg Chest 2 View  Result Date: 12/26/2017 CLINICAL DATA:  62 year old with chest pain and shortness of breath. EXAM: CHEST - 2 VIEW COMPARISON:  01/28/2017 FINDINGS: Stable enlargement of the cardiac silhouette. The lungs are clear without airspace disease or pulmonary edema. Chronic elevation of the right hemidiaphragm. No large pleural effusions. No acute bone abnormality. IMPRESSION: Stable cardiomegaly. No acute findings. Electronically Signed   By: Markus Daft M.D.   On: 12/26/2017 20:58   Ct Abdomen Pelvis W Contrast  Result Date: 12/26/2017 CLINICAL DATA:  62 year old female with left lower quadrant abdominal pain and diarrhea. EXAM: CT ABDOMEN AND PELVIS WITH CONTRAST TECHNIQUE: Multidetector CT imaging of the abdomen and pelvis was performed using the standard protocol following bolus  administration of intravenous contrast. CONTRAST:  122mL OMNIPAQUE IOHEXOL 300 MG/ML  SOLN COMPARISON:  Right upper quadrant ultrasound dated 04/11/2016, CT of abdomen pelvis dated 01/04/2010, and MRI dated 08/17/2012 FINDINGS: Lower chest: The visualized lung bases are clear. There is mild cardiomegaly. No intra-abdominal free air or free fluid Hepatobiliary: Cholecystectomy. The liver is unremarkable. No intrahepatic biliary ductal dilatation. Pancreas: Unremarkable. No pancreatic ductal dilatation or surrounding inflammatory changes. Spleen: Normal in size without focal abnormality. Adrenals/Urinary Tract: The adrenal glands are unremarkable.  Subcentimeter right renal hypodensities are too small to characterize. There is no hydronephrosis on either side. There is symmetric enhancement and excretion of contrast by both kidneys. The visualized ureters and urinary bladder appear unremarkable. Stomach/Bowel: There is no bowel obstruction or active inflammation. Normal appendix. Vascular/Lymphatic: The abdominal aorta and IVC are unremarkable. No portal venous gas. 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 There is no adenopathy. Reproductive: Hysterectomy. There is a 3.5 x 3.0 cm hypodense lesion the left ovary which is similar to prior CT of 2011, likely a benign or indolent process such as a serous lesion. Other: None Musculoskeletal: Mild degenerative changes of the spine. No acute osseous pathology. IMPRESSION: 1. No acute intra-abdominal or pelvic pathology. No bowel obstruction or active inflammation. Normal appendix. 2. Stable appearance of the left ovarian lesion compared to the CT of 2011. This lesion is not characterized on this CT but likely represents a benign or indolent process such as a serous epithelial lesion given interval stability. 3. Left renal artery aneurysms, unchanged.  4. Mild cardiomegaly. Electronically Signed   By: Anner Crete M.D.   On: 12/26/2017 21:52    Procedures Procedures (including critical care time)  Medications Ordered in ED Medications  HYDROmorphone (DILAUDID) injection 1 mg (1 mg Intravenous Given 12/26/17 2100)  ondansetron (ZOFRAN) injection 4 mg (4 mg Intravenous Given 12/26/17 2100)  sodium chloride 0.9 % bolus 500 mL (0 mLs Intravenous Stopped 12/26/17 2209)  iohexol (OMNIPAQUE) 300 MG/ML solution 100 mL (100 mLs Intravenous Contrast Given 12/26/17 2127)     Initial Impression / Assessment and Plan / ED Course  I have reviewed the triage vital signs and the nursing notes.  Pertinent labs & imaging results that were available during my care of the patient were reviewed by me and considered in my medical decision making (see chart for details).     Presents to the emergency department for evaluation of abdominal pain and chest pain.   On exam patient appears comfortable at the bedside.  No acute distress.  Vital signs stable.  She is acutely tender to palpation in the epigastrium as well as umbilicus.  No guarding or rigidity.  Labs reviewed. CBC WNL, no leukocytosis. CMP reveals elevated glucose (bg 376), patient known diabetic. No anion gap, no concern for DKA. NS bolus running. Creatinine elevated, although actually appears improved from her baseline. Liver enzymes WNL. Lipase negative. Given her acute tenderness on exam, plan to get CT abdomen/pelvis for further evaluation. CT abdomen/pelvis negative for acute abnormality. Given timing of symptoms after meals, her pain is likely related to gastritis.   Patient also reporting chest pain.  Reports substernal nonradiating pain with associated shortness of breath and at times feels sweaty.  Initial troponin negative and EKG without acute ischemic changes. HEART Score 4. On recheck continues to report chest pain. Plan to admit patient to hospitalist service for chest pain ACS r/o. Patient  does have a history of DVT in the past, although do not suspect PE at this time given presentation, no tachypnea, hypoxia, unilateral leg swelling/calf tenderness, recent surgery or immobilization, exogenous estrogen, active cancer. She is on Pradaxa. No neurological symptoms, pulse deficits, neurological symptoms, radiation to the back to suggest TAD. CXR without acute abnormality, no pneumonia, pneumothorax or mediastinum.   This was a shared visit with Dr. Vanita Panda who also saw the patient and  agrees with plan to admit.   Final Clinical Impressions(s) / ED Diagnoses   Final diagnoses:  None    ED Discharge Orders    None       Bernarda Caffey 12/26/17 2307    Carmin Muskrat, MD 12/27/17 1322

## 2017-12-26 NOTE — H&P (Addendum)
History and Physical    [name] TDV:[id number] DOB: [birth year] DOA: 12/26/2017  PCP: Wallene Huh, MD  Patient coming from: Home.  Chief Complaint: Chest pain.  HPI: [name] is a 62 y.o. female with history of atrial fibrillation, DVT, diabetes mellitus, CHF, hyperlipidemia, sleep apnea presents to the ER with complaints of chest pain ongoing for last 4 days.  Patient states the chest pain is retrosternal and epigastric with some nausea and burning sensation at times pressure-like.  Present even at rest.  Denies any associated shortness of breath fever chills productive cough.  Says the pain worsened today patient came to the ER.  Patient had a stress test in 2017 which was unremarkable.  ED Course: In the ER EKG was showing A. fib rate controlled troponin was negative since patient was complaining of epigastric pain CT abdomen and pelvis was done which was unremarkable.  LFTs were normal.  Patient continued to complain of chest pain.  Patient admitted for further observation.  Review of Systems: As per HPI, rest all negative.   Past Medical History:  Diagnosis Date  . Allergic rhinitis   . Arthritis   . Asthma   . Chronic diastolic CHF (congestive heart failure) ([place])   . COPD (chronic obstructive pulmonary disease) ([place])   . Depression   . DM (diabetes mellitus) ([place])   . DVT (deep vein thrombosis) in pregnancy ([place])   . Dysrhythmia    atrial fibrilation  . GERD (gastroesophageal reflux disease)   . Headache(784.0)   . HTN (hypertension)   . Hyperlipidemia   . Obesity   . Shortness of breath   . Sleep apnea    compliant with CPAP    Past Surgical History:  Procedure Laterality Date  . ABDOMINAL HYSTERECTOMY    . CHOLECYSTECTOMY    . COLONOSCOPY WITH PROPOFOL N/A 03/05/2015   Procedure: COLONOSCOPY WITH PROPOFOL;  Surgeon: Carol Ada, MD;  Location: [facility];  Service: Endoscopy;  Laterality: N/A;  . DIAGNOSTIC LAPAROSCOPY    . ingrown  hallux Left   . KNEE SURGERY       reports that she has never smoked. She has never used smokeless tobacco. She reports that she does not drink alcohol or use drugs.  Allergies  Allergen Reactions  . Nsaids Other (See Comments)    On warfarin  . Sulfa Antibiotics Itching    Family History  Problem Relation Age of Onset  . Cancer Mother   . Hypertension Mother   . Cancer Father   . CAD Unknown   . Hypertension Sister   . Hypertension Brother     Prior to Admission medications   Medication Sig Start Date End Date Taking? Authorizing Provider  carvedilol (COREG) 12.5 MG tablet Take 12.5 mg by mouth 2 (two) times daily with a meal.   Yes [provider]  clorazepate (TRANXENE) 7.5 MG tablet Take 7.5 mg by mouth daily.   Yes [provider]  dabigatran (PRADAXA) 150 MG CAPS capsule Take 1 capsule (150 mg total) by mouth every 12 (twelve) hours. 04/12/16  Yes Florencia Reasons, MD  dexlansoprazole (DEXILANT) 60 MG capsule Take 60 mg by mouth daily.   Yes [provider]  digoxin (LANOXIN) 0.25 MG tablet Take 0.5 tablets (0.125 mg total) by mouth daily. Patient taking differently: Take 0.25 mg by mouth daily.  03/21/15  Yes Dixie Dials, MD  diphenhydrAMINE (BENADRYL) 25 MG tablet Take 25 mg by mouth every 4 (four) hours as needed for itching.  Yes [provider]  DULoxetine (CYMBALTA) 60 MG capsule Take 60 mg by mouth daily.   Yes [provider]  furosemide (LASIX) 40 MG tablet Take 1 tablet (40 mg total) by mouth daily as needed for fluid or edema. 04/12/16  Yes Florencia Reasons, MD  gabapentin (NEURONTIN) 300 MG capsule Take 300 mg by mouth 2 (two) times daily.  09/13/11  Yes [provider]  hydrOXYzine (VISTARIL) 50 MG capsule Take 50 mg by mouth every 6 (six) hours as needed. 12/13/17  Yes [provider]  insulin aspart (NOVOLOG) 100 UNIT/ML injection Inject 25 Units into the skin 2 (two) times daily.    Yes [provider]    insulin detemir (LEVEMIR) 100 UNIT/ML injection Inject 35 Units into the skin daily.    Yes [provider]  losartan (COZAAR) 50 MG tablet Take 50 mg by mouth daily.   Yes [provider]  Multiple Vitamins-Minerals (MULTIVITAMIN WOMEN PO) Take 1 tablet by mouth daily.   Yes [provider]  nitroGLYCERIN (NITROSTAT) 0.4 MG SL tablet Place 1 tablet (0.4 mg total) under the tongue every 5 (five) minutes x 3 doses as needed for chest pain. 01/08/14  Yes Charolette Forward, MD  omeprazole (PRILOSEC) 40 MG capsule Take 40 mg by mouth every morning.  08/30/11  Yes [provider]  potassium chloride SA (K-DUR,KLOR-CON) 20 MEQ tablet Take 20 mEq by mouth daily. 12/25/17  Yes [provider]  PROAIR HFA 108 (90 BASE) MCG/ACT inhaler Inhale 2 puffs into the lungs every 4 (four) hours as needed for wheezing (wheezing).  05/28/12  Yes [provider]  RESTASIS 0.05 % ophthalmic emulsion Place 1 drop into both eyes 2 (two) times daily. 11/19/17  Yes [provider]  rosuvastatin (CRESTOR) 10 MG tablet Take 10 mg by mouth daily with breakfast.   Yes [provider]  sitaGLIPtin (JANUVIA) 50 MG tablet Take 50 mg by mouth daily with breakfast.   Yes [provider]  spironolactone (ALDACTONE) 25 MG tablet Take 25 mg by mouth daily.  09/13/11  Yes [provider]    Physical Exam: Vitals:   12/26/17 2000 12/26/17 2130 12/26/17 2300  BP: 127/74 129/79 125/81  Pulse: (!) 104 (!) 101   Resp: (!) 25 11   SpO2: 99% 92% 95%      Constitutional: Moderately built and nourished. Vitals:   12/26/17 2000 12/26/17 2130 12/26/17 2300  BP: 127/74 129/79 125/81  Pulse: (!) 104 (!) 101   Resp: (!) 25 11   SpO2: 99% 92% 95%   Eyes: Anicteric no pallor. ENMT: No discharge from the ears eyes nose or mouth. Neck: No mass palpated no neck rigidity no JVD appreciated. Respiratory: No rhonchi or crepitations. Cardiovascular: S1-S2 heard no  murmur appreciated. Abdomen: Epigastric tenderness present.  No guarding or rigidity. Musculoskeletal: No edema.  No joint effusion. Skin: No rash.  Skin appears warm. Neurologic: Alert awake oriented to time place and person.  Moves all extremities. Psychiatric: Appears normal.  Normal affect.  A. fib rate controlled.   Labs on Admission: I have personally reviewed following labs and imaging studies  CBC: Recent Labs  Lab 12/26/17 1955  WBC 10.5  HGB 14.4  HCT 42.7  MCV 84.1  PLT 169   Basic Metabolic Panel: Recent Labs  Lab 12/26/17 1955  NA 135  K 4.4  CL 97*  CO2 30  GLUCOSE 376*  BUN 6  CREATININE 1.35*  CALCIUM 9.7  GFR: CrCl cannot be calculated (Unknown ideal weight.). Liver Function Tests: Recent Labs  Lab 12/26/17 1955  AST 16  ALT 12*  ALKPHOS 85  BILITOT 0.6  PROT 7.8  ALBUMIN 3.4*   Recent Labs  Lab 12/26/17 1955  LIPASE 24   No results for input(s): AMMONIA in the last 168 hours. Coagulation Profile: No results for input(s): INR, PROTIME in the last 168 hours. Cardiac Enzymes: No results for input(s): CKTOTAL, CKMB, CKMBINDEX, TROPONINI in the last 168 hours. BNP (last 3 results) No results for input(s): PROBNP in the last 8760 hours. HbA1C: No results for input(s): HGBA1C in the last 72 hours. CBG: No results for input(s): GLUCAP in the last 168 hours. Lipid Profile: No results for input(s): CHOL, HDL, LDLCALC, TRIG, CHOLHDL, LDLDIRECT in the last 72 hours. Thyroid Function Tests: No results for input(s): TSH, T4TOTAL, FREET4, T3FREE, THYROIDAB in the last 72 hours. Anemia Panel: No results for input(s): VITAMINB12, FOLATE, FERRITIN, TIBC, IRON, RETICCTPCT in the last 72 hours. Urine analysis:    Component Value Date/Time   COLORURINE YELLOW 04/09/2016 1245   APPEARANCEUR HAZY (A) 04/09/2016 1245   LABSPEC 1.020 04/09/2016 1245   PHURINE 5.5 04/09/2016 1245   GLUCOSEU NEGATIVE 04/09/2016 1245   HGBUR NEGATIVE 04/09/2016 1245     BILIRUBINUR NEGATIVE 04/09/2016 1245   KETONESUR NEGATIVE 04/09/2016 1245   PROTEINUR NEGATIVE 04/09/2016 1245   UROBILINOGEN 1.0 10/26/2014 0249   NITRITE NEGATIVE 04/09/2016 1245   LEUKOCYTESUR NEGATIVE 04/09/2016 1245   Sepsis Labs: @LABRCNTIP (procalcitonin:4,lacticidven:4) )No results found for this or any previous visit (from the past 240 hour(s)).   Radiological Exams on Admission: Dg Chest 2 View  Result Date: 12/26/2017 CLINICAL DATA:  62 year old with chest pain and shortness of breath. EXAM: CHEST - 2 VIEW COMPARISON:  01/28/2017 FINDINGS: Stable enlargement of the cardiac silhouette. The lungs are clear without airspace disease or pulmonary edema. Chronic elevation of the right hemidiaphragm. No large pleural effusions. No acute bone abnormality. IMPRESSION: Stable cardiomegaly. No acute findings. Electronically Signed   By: Markus Daft M.D.   On: 12/26/2017 20:58   Ct Abdomen Pelvis W Contrast  Result Date: 12/26/2017 CLINICAL DATA:  62 year old female with left lower quadrant abdominal pain and diarrhea. EXAM: CT ABDOMEN AND PELVIS WITH CONTRAST TECHNIQUE: Multidetector CT imaging of the abdomen and pelvis was performed using the standard protocol following bolus administration of intravenous contrast. CONTRAST:  167mL OMNIPAQUE IOHEXOL 300 MG/ML  SOLN COMPARISON:  Right upper quadrant ultrasound dated 04/11/2016, CT of abdomen pelvis dated 01/04/2010, and MRI dated 08/17/2012 FINDINGS: Lower chest: The visualized lung bases are clear. There is mild cardiomegaly. No intra-abdominal free air or free fluid Hepatobiliary: Cholecystectomy. The liver is unremarkable. No intrahepatic biliary ductal dilatation. Pancreas: Unremarkable. No pancreatic ductal dilatation or surrounding inflammatory changes. Spleen: Normal in size without focal abnormality. Adrenals/Urinary Tract: The adrenal glands are unremarkable. Subcentimeter right renal hypodensities are too small to characterize. There is no  hydronephrosis on either side. There is symmetric enhancement and excretion of contrast by both kidneys. The visualized ureters and urinary bladder appear unremarkable. Stomach/Bowel: There is no bowel obstruction or active inflammation. Normal appendix. Vascular/Lymphatic: The abdominal aorta and IVC are unremarkable. No portal venous gas. 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 There is no adenopathy. Reproductive: Hysterectomy. There is a 3.5 x 3.0 cm hypodense lesion the left ovary  which is similar to prior CT of 2011, likely a benign or indolent process such as a serous lesion. Other: None Musculoskeletal: Mild degenerative changes of the spine. No acute osseous pathology. IMPRESSION: 1. No acute intra-abdominal or pelvic pathology. No bowel obstruction or active inflammation. Normal appendix. 2. Stable appearance of the left ovarian lesion compared to the CT of 2011. This lesion is not characterized on this CT but likely represents a benign or indolent process such as a serous epithelial lesion given interval stability. 3. Left renal artery aneurysms, unchanged. 4. Mild cardiomegaly. Electronically Signed   By: Anner Crete M.D.   On: 12/26/2017 21:52    EKG: Independently reviewed. A. fib rate controlled.  Assessment/Plan Principal Problem:   Chest pain Active Problems:   Morbid obesity (HCC)   OSA (obstructive sleep apnea)   Chronic atrial fibrillation ([facility])   Diastolic dysfunction   Diabetes mellitus type 2 in obese ([place])    1. Chest pain/epigastric pain -chest pain appears atypical.  Burning in sensation at times pressure-like.  Patient has had unremarkable stress test in 2017.  Discussed with Dr. Terrence Dupont patient's cardiologist who will be seeing patient in consult and advised to cycle cardiac markers.  Will check 2D echo.  Check d-dimer.  Patient is on Pradaxa  and beta-blockers and statins.  Add PPI. 2. Diabetes mellitus type 2 with hyperglycemia -continue with patient's long-acting insulin Levemir along with sliding scale coverage and scheduled NovoLog.  Closely follow CBGs and metabolic panel. 3. History of diastolic dysfunction presently appears compensated.  Takes Lasix as needed and is on scheduled dose of spironolactone. 4. History of sleep apnea on CPAP. 5. Hyperlipidemia on statins. 6. 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 7. History of atrial fibrillation presently on Coreg and Pradaxa.   DVT prophylaxis: Pradaxa. Code Status: Full code. Family Communication: Discussed with patient. Disposition Plan: Home. Consults called: Cardiology. Admission status: Observation.   Rise Patience MD [facility] Pager [phone].  If 7PM-7AM, please contact night-coverage [email or web address] Password [facility]  12/26/2017, 11:32 PM

## 2017-12-26 NOTE — ED Notes (Signed)
ED Provider at bedside. 

## 2017-12-26 NOTE — ED Triage Notes (Signed)
Patient arrived with EMS from home reports persistent substernal / mid abdominal pain onset Friday last week , described as"burning" pain , mild SOB , no nausea or cough. CBG= 320 by EMS , history of CHF/gallstones/DM.

## 2017-12-27 ENCOUNTER — Other Ambulatory Visit: Payer: Self-pay

## 2017-12-27 ENCOUNTER — Observation Stay (HOSPITAL_COMMUNITY): Payer: Medicare HMO

## 2017-12-27 ENCOUNTER — Encounter (HOSPITAL_COMMUNITY): Payer: Self-pay | Admitting: Radiology

## 2017-12-27 DIAGNOSIS — R079 Chest pain, unspecified: Secondary | ICD-10-CM

## 2017-12-27 HISTORY — DX: Chest pain, unspecified: R07.9

## 2017-12-27 LAB — CBG MONITORING, ED
Glucose-Capillary: 377 mg/dL — ABNORMAL HIGH (ref 65–99)
Glucose-Capillary: 278 mg/dL — ABNORMAL HIGH (ref 65–99)
Glucose-Capillary: 287 mg/dL — ABNORMAL HIGH (ref 65–99)

## 2017-12-27 LAB — URINALYSIS, ROUTINE W REFLEX MICROSCOPIC
Bilirubin Urine: NEGATIVE
Glucose, UA: 50 mg/dL — AB
Hgb urine dipstick: NEGATIVE
Ketones, ur: NEGATIVE mg/dL
Leukocytes, UA: NEGATIVE
Nitrite: NEGATIVE
Protein, ur: NEGATIVE mg/dL
Specific Gravity, Urine: 1.034 — ABNORMAL HIGH (ref 1.005–1.030)
pH: 5 (ref 5.0–8.0)

## 2017-12-27 LAB — BASIC METABOLIC PANEL
Anion gap: 11 (ref 5–15)
BUN: 7 mg/dL (ref 6–20)
CO2: 26 mmol/L (ref 22–32)
Calcium: 8.9 mg/dL (ref 8.9–10.3)
Chloride: 100 mmol/L — ABNORMAL LOW (ref 101–111)
Creatinine, Ser: 1.24 mg/dL — ABNORMAL HIGH (ref 0.44–1.00)
GFR calc Af Amer: 53 mL/min — ABNORMAL LOW (ref >60)
GFR calc non Af Amer: 46 mL/min — ABNORMAL LOW (ref >60)
Glucose, Bld: 277 mg/dL — ABNORMAL HIGH (ref 65–99)
Potassium: 3.7 mmol/L (ref 3.5–5.1)
Sodium: 137 mmol/L (ref 135–145)

## 2017-12-27 LAB — HIV ANTIBODY (ROUTINE TESTING W REFLEX): HIV Screen 4th Generation wRfx: NONREACTIVE

## 2017-12-27 LAB — DIGOXIN LEVEL: Digoxin Level: 1.2 ng/mL (ref 0.8–2.0)

## 2017-12-27 LAB — D-DIMER, QUANTITATIVE: D-Dimer, Quant: 0.27 ug/mL-FEU (ref 0.00–0.50)

## 2017-12-27 LAB — ECHOCARDIOGRAM COMPLETE
Height: 67 in
Weight: 4732.8 oz

## 2017-12-27 LAB — GLUCOSE, CAPILLARY
Glucose-Capillary: 303 mg/dL — ABNORMAL HIGH (ref 65–99)
Glucose-Capillary: 358 mg/dL — ABNORMAL HIGH (ref 65–99)

## 2017-12-27 LAB — TROPONIN I
Troponin I: <0.03 ng/mL (ref <0.03)
Troponin I: <0.03 ng/mL (ref <0.03)
Troponin I: <0.03 ng/mL (ref <0.03)

## 2017-12-27 MED ORDER — METOPROLOL TARTRATE 25 MG PO TABS
25.0000 mg | ORAL_TABLET | Freq: Two times a day (BID) | ORAL | Status: DC
  Administered 2017-12-27 – 2017-12-28 (×2): 25 mg via ORAL
  Filled 2017-12-27 (×2): qty 1

## 2017-12-27 MED ORDER — TECHNETIUM TC 99M TETROFOSMIN IV KIT: 10.0000 | PACK | Freq: Once | INTRAVENOUS | Status: DC | PRN

## 2017-12-27 MED ORDER — REGADENOSON 0.4 MG/5ML IV SOLN
0.4000 mg | Freq: Once | INTRAVENOUS | Status: DC
  Filled 2017-12-27: qty 5

## 2017-12-27 MED ORDER — PANTOPRAZOLE SODIUM 40 MG PO TBEC
40.0000 mg | DELAYED_RELEASE_TABLET | Freq: Two times a day (BID) | ORAL | Status: DC
  Administered 2017-12-27 – 2018-01-01 (×10): 40 mg via ORAL
  Filled 2017-12-27 (×10): qty 1

## 2017-12-27 MED ORDER — ENSURE ENLIVE PO LIQD
237.0000 mL | Freq: Two times a day (BID) | ORAL | Status: DC
  Administered 2017-12-28 – 2017-12-30 (×3): 237 mL via ORAL

## 2017-12-27 MED ORDER — INSULIN ASPART 100 UNIT/ML ~~LOC~~ SOLN
5.0000 [IU] | Freq: Three times a day (TID) | SUBCUTANEOUS | Status: DC
  Administered 2017-12-27: 5 [IU] via SUBCUTANEOUS

## 2017-12-27 MED ORDER — REGADENOSON 0.4 MG/5ML IV SOLN
INTRAVENOUS | Status: AC
  Filled 2017-12-27: qty 5

## 2017-12-27 NOTE — ED Notes (Signed)
Patient is resting on bed no complaints at present.

## 2017-12-27 NOTE — ED Notes (Signed)
MD K at bedside

## 2017-12-27 NOTE — Progress Notes (Signed)
  Echocardiogram 2D Echocardiogram has been performed.  [name] 12/27/2017, 3:02 PM

## 2017-12-27 NOTE — ED Notes (Signed)
Messaged pharmacy to verify meds and send to [facility]

## 2017-12-27 NOTE — ED Notes (Signed)
Patient remains in CT 

## 2017-12-27 NOTE — Plan of Care (Signed)
Afib on monitor. Takes Pradaxa. 2nd part of Stress Test scheduled for am.

## 2017-12-27 NOTE — Progress Notes (Signed)
PROGRESS NOTE    [name]  ACZ:[id number] DOB: May 07, 1956 DOA: 12/26/2017 PCP: Wallene Huh, MD    Brief Narrative: [name] is a 62 y.o. female with history of atrial fibrillation, DVT, diabetes mellitus, CHF, hyperlipidemia, sleep apnea presents to the ER with complaints of chest pain ongoing for last 4 days.  Patient states the chest pain is retrosternal and epigastric with some nausea and burning sensation at times pressure-like.  Present even at rest.  Denies any associated shortness of breath fever chills productive cough.  Says the pain worsened today patient came to the ER.  Patient had a stress test in 2017 which was unremarkable.  ED Course: In the ER EKG was showing A. fib rate controlled troponin was negative since patient was complaining of epigastric pain CT abdomen and pelvis was done which was unremarkable.  LFTs were normal.  Patient continued to complain of chest pain.  Patient admitted for further observation.      Assessment & Plan:   Principal Problem:   Chest pain Active Problems:   Morbid obesity (HCC)   OSA (obstructive sleep apnea)   Chronic atrial fibrillation (HCC)   Diastolic dysfunction   Diabetes mellitus type 2 in obese (HCC)   1-Chest pain, epigastric pain.  LFT normal. Lipase normal.  CT abdomen pelvis no acute abnormalities. Prior cholecystectomy.  Troponin negative.  Start PPI BID./  Stress test first part performed today.  D dimer negative.  Denies melena.  ECHO. Pending   2-DM;  Continue with levemir. Change novo log to 5 units. In anticipation of poor oral intake due to pain when she eats.   3-Chronic diastolic HF. Compensated. On spironolactone.  Lasix prn.   Left Ovarian lesion since 2011. Informed results to patient. She will need to follow up with GYN for further evaluation.   History of sleep apnea on CPAP. Hyperlipidemia on statins. 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 History of atrial fibrillation  presently on Coreg and Pradaxa.    DVT prophylaxis: pradaxa Code Status: full code Family Communication: care discussed with patient.  Disposition Plan: home when work up completed.     Consultants:   Cardiology    Procedures: stress test   echo  Antimicrobials: none  Subjective: She is chest pain free. Report chest pain, epigastric pain , worse when she eats.    Objective: Vitals:   12/27/17 0734 12/27/17 1053 12/27/17 1100 12/27/17 1357  BP: 97/70 (!) 111/58 (!) 59/48 (!) 128/96  Pulse: (!) 109 (!) 106 92 100  Resp: 18 16 17    Temp: 98.4 F (36.9 C)   97.6 F (36.4 C)  TempSrc: Oral   Oral  SpO2: 98% 98% 97% 98%  Weight:    134.2 kg (295 lb 12.8 oz)  Height:    5\' 7"  (1.702 m)   No intake or output data in the 24 hours ending 12/27/17 1530 Filed Weights   12/27/17 1357  Weight: 134.2 kg (295 lb 12.8 oz)    Examination:  General exam: Appears calm and comfortable  Respiratory system: Clear to auscultation. Respiratory effort normal. Cardiovascular system: S1 & S2 heard, RRR. No JVD, murmurs, rubs, gallops or clicks. No pedal edema. Gastrointestinal system: Abdomen is nondistended, soft and nontender. No organomegaly or masses felt. Normal bowel sounds heard. Central nervous system: Alert and oriented. No focal neurological deficits. Extremities: Symmetric 5 x 5 power. Skin: No rashes, lesions or ulcers Psychiatry: Judgement and insight appear normal. Mood & affect appropriate.     Data  Reviewed: I have personally reviewed following labs and imaging studies  CBC: Recent Labs  Lab 12/26/17 1955  WBC 10.5  HGB 14.4  HCT 42.7  MCV 84.1  PLT 272   Basic Metabolic Panel: Recent Labs  Lab 12/26/17 1955 12/27/17 0935  NA 135 137  K 4.4 3.7  CL 97* 100*  CO2 30 26  GLUCOSE 376* 277*  BUN 6 7  CREATININE 1.35* 1.24*  CALCIUM 9.7 8.9   GFR: Estimated Creatinine Clearance: 68.1 mL/min (A) (by C-G formula based on SCr of 1.24 mg/dL  (H)). Liver Function Tests: Recent Labs  Lab 12/26/17 1955  AST 16  ALT 12*  ALKPHOS 85  BILITOT 0.6  PROT 7.8  ALBUMIN 3.4*   Recent Labs  Lab 12/26/17 1955  LIPASE 24   No results for input(s): AMMONIA in the last 168 hours. Coagulation Profile: No results for input(s): INR, PROTIME in the last 168 hours. Cardiac Enzymes: Recent Labs  Lab 12/27/17 0034 12/27/17 0457 12/27/17 0935  TROPONINI <0.03 <0.03 <0.03   BNP (last 3 results) No results for input(s): PROBNP in the last 8760 hours. HbA1C: No results for input(s): HGBA1C in the last 72 hours. CBG: Recent Labs  Lab 12/27/17 0234 12/27/17 0824 12/27/17 1336  GLUCAP 377* 278* 287*   Lipid Profile: No results for input(s): CHOL, HDL, LDLCALC, TRIG, CHOLHDL, LDLDIRECT in the last 72 hours. Thyroid Function Tests: No results for input(s): TSH, T4TOTAL, FREET4, T3FREE, THYROIDAB in the last 72 hours. Anemia Panel: No results for input(s): VITAMINB12, FOLATE, FERRITIN, TIBC, IRON, RETICCTPCT in the last 72 hours. Sepsis Labs: No results for input(s): PROCALCITON, LATICACIDVEN in the last 168 hours.  No results found for this or any previous visit (from the past 240 hour(s)).       Radiology Studies: Dg Chest 2 View  Result Date: 12/26/2017 CLINICAL DATA:  62 year old with chest pain and shortness of breath. EXAM: CHEST - 2 VIEW COMPARISON:  01/28/2017 FINDINGS: Stable enlargement of the cardiac silhouette. The lungs are clear without airspace disease or pulmonary edema. Chronic elevation of the right hemidiaphragm. No large pleural effusions. No acute bone abnormality. IMPRESSION: Stable cardiomegaly. No acute findings. Electronically Signed   By: Markus Daft M.D.   On: 12/26/2017 20:58   Ct Abdomen Pelvis W Contrast  Result Date: 12/26/2017 CLINICAL DATA:  62 year old female with left lower quadrant abdominal pain and diarrhea. EXAM: CT ABDOMEN AND PELVIS WITH CONTRAST TECHNIQUE: Multidetector CT imaging of  the abdomen and pelvis was performed using the standard protocol following bolus administration of intravenous contrast. CONTRAST:  177mL OMNIPAQUE IOHEXOL 300 MG/ML  SOLN COMPARISON:  Right upper quadrant ultrasound dated 04/11/2016, CT of abdomen pelvis dated 01/04/2010, and MRI dated 08/17/2012 FINDINGS: Lower chest: The visualized lung bases are clear. There is mild cardiomegaly. No intra-abdominal free air or free fluid Hepatobiliary: Cholecystectomy. The liver is unremarkable. No intrahepatic biliary ductal dilatation. Pancreas: Unremarkable. No pancreatic ductal dilatation or surrounding inflammatory changes. Spleen: Normal in size without focal abnormality. Adrenals/Urinary Tract: The adrenal glands are unremarkable. Subcentimeter right renal hypodensities are too small to characterize. There is no hydronephrosis on either side. There is symmetric enhancement and excretion of contrast by both kidneys. The visualized ureters and urinary bladder appear unremarkable. Stomach/Bowel: There is no bowel obstruction or active inflammation. Normal appendix. Vascular/Lymphatic: The abdominal aorta and IVC are unremarkable. No portal venous gas. There are two aneurysms of the left renal artery at the renal hilum measuring 2.1 cm and 1.7 cm  in maximal diameter not significantly changed compared to the prior CT. There is no adenopathy. Reproductive: Hysterectomy. There is a 3.5 x 3.0 cm hypodense lesion the left ovary which is similar to prior CT of 2011, likely a benign or indolent process such as a serous lesion. Other: None Musculoskeletal: Mild degenerative changes of the spine. No acute osseous pathology. IMPRESSION: 1. No acute intra-abdominal or pelvic pathology. No bowel obstruction or active inflammation. Normal appendix. 2. Stable appearance of the left ovarian lesion compared to the CT of 2011. This lesion is not characterized on this CT but likely represents a benign or indolent process such as a serous  epithelial lesion given interval stability. 3. Left renal artery aneurysms, unchanged. 4. Mild cardiomegaly. Electronically Signed   By: Anner Crete M.D.   On: 12/26/2017 21:52        Scheduled Meds: . aspirin EC  325 mg Oral Daily  . clorazepate  7.5 mg Oral Daily  . cycloSPORINE  1 drop Both Eyes BID  . dabigatran  150 mg Oral Q12H  . digoxin  0.25 mg Oral Daily  . DULoxetine  60 mg Oral Daily  . gabapentin  300 mg Oral BID  . insulin aspart  0-15 Units Subcutaneous TID WC  . insulin aspart  5 Units Subcutaneous TID WC  . insulin detemir  35 Units Subcutaneous Daily  . losartan  50 mg Oral Daily  . metoprolol tartrate  25 mg Oral BID  . pantoprazole  40 mg Oral BID  . regadenoson      . rosuvastatin  10 mg Oral Q breakfast  . spironolactone  25 mg Oral Daily   Continuous Infusions:   LOS: 0 days    Time spent: 35 minutes.     Elmarie Shiley, MD [facility] Pager [phone]  If 7PM-7AM, please contact night-coverage [email or web address] Password [email or web address] 12/27/2017, 3:30 PM

## 2017-12-27 NOTE — ED Notes (Signed)
Patient transported to CT for cardiac stress test.

## 2017-12-27 NOTE — Consult Note (Signed)
Reason for Consult:recurrent chest pain Referring Physician: [facility] hospitalist  [name] is an 62 y.o. female.  URK:[name] is 62 year old female with past medical history significant for hypertension, type 2 diabetes mellitus, chronic atrial fibrillation on chronic anticoagulation, COPD, obstructive sleep apnea/obesity hypoventilation syndrome, history of congestive heart failure secondary to systolic/diastolic dysfunction, chronic kidney disease, history of DVT in the past, depression, degenerative joint disease, morbid obesity, positive family history of coronary artery disease, was admitted yesterday because of recurrent retrosternal chest pain described as pressure tightness burning off and on for last few days associated with mild shortness of breath and diaphoresis also complains of vague epigastric pain. Chest pain is localized denies any exertional chest pain although activity is very limited patient does give history of exertional dyspnea with minimal exertion. Denies any recent cardiac workup.EKG done in the ED showed A. Fib with moderate ventricular response with possible old inferior and anteroseptal wall MI age undetermined.3 sets of cardiac enzymes have been negative.  Past Medical History:  Diagnosis Date  . Allergic rhinitis   . Arthritis   . Asthma   . Chronic diastolic CHF (congestive heart failure) ([place])   . COPD (chronic obstructive pulmonary disease) ([place])   . Depression   . DM (diabetes mellitus) ([place])   . DVT (deep vein thrombosis) in pregnancy ([place])   . Dysrhythmia    atrial fibrilation  . GERD (gastroesophageal reflux disease)   . Headache(784.0)   . HTN (hypertension)   . Hyperlipidemia   . Obesity   . Shortness of breath   . Sleep apnea    compliant with CPAP    Past Surgical History:  Procedure Laterality Date  . ABDOMINAL HYSTERECTOMY    . CHOLECYSTECTOMY    . COLONOSCOPY WITH PROPOFOL N/A 03/05/2015   Procedure: COLONOSCOPY WITH PROPOFOL;   Surgeon: Carol Ada, MD;  Location: [facility];  Service: Endoscopy;  Laterality: N/A;  . DIAGNOSTIC LAPAROSCOPY    . ingrown hallux Left   . KNEE SURGERY      Family History  Problem Relation Age of Onset  . Cancer Mother   . Hypertension Mother   . Cancer Father   . CAD Unknown   . Hypertension Sister   . Hypertension Brother     Social History:  reports that she has never smoked. She has never used smokeless tobacco. She reports that she does not drink alcohol or use drugs.  Allergies:  Allergies  Allergen Reactions  . Nsaids Other (See Comments)    On warfarin  . Sulfa Antibiotics Itching    Medications: I have reviewed the patient's current medications. Prior to Admission:  (Not in a hospital admission)  Results for orders placed or performed during the hospital encounter of 12/26/17 (from the past 48 hour(s))  Lipase, blood     Status: None   Collection Time: 12/26/17  7:55 PM  Result Value Ref Range   Lipase 24 11 - 51 U/L    Comment: Performed at [facility], [address], [place], [place] [address]  Comprehensive metabolic panel     Status: Abnormal   Collection Time: 12/26/17  7:55 PM  Result Value Ref Range   Sodium 135 135 - 145 mmol/L   Potassium 4.4 3.5 - 5.1 mmol/L   Chloride 97 (L) 101 - 111 mmol/L   CO2 30 22 - 32 mmol/L   Glucose, Bld 376 (H) 65 - 99 mg/dL   BUN 6 6 - 20 mg/dL   Creatinine, Ser  1.35 (H) 0.44 - 1.00 mg/dL   Calcium 9.7 8.9 - 10.3 mg/dL   Total Protein 7.8 6.5 - 8.1 g/dL   Albumin 3.4 (L) 3.5 - 5.0 g/dL   AST 16 15 - 41 U/L   ALT 12 (L) 14 - 54 U/L   Alkaline Phosphatase 85 38 - 126 U/L   Total Bilirubin 0.6 0.3 - 1.2 mg/dL   GFR calc non Af Amer 41 (L) >60 mL/min   GFR calc Af Amer 48 (L) >60 mL/min    Comment: (NOTE) The eGFR has been calculated using the CKD EPI equation. This calculation has not been validated in all clinical situations. eGFR's persistently <60 mL/min signify possible Chronic  Kidney Disease.    Anion gap 8 5 - 15    Comment: Performed at [facility] [address] [place] [address]  CBC     Status: None   Collection Time: 12/26/17  7:55 PM  Result Value Ref Range   WBC 10.5 4.0 - 10.5 K/uL   RBC 5.08 3.87 - 5.11 MIL/uL   Hemoglobin 14.4 12.0 - 15.0 g/dL   HCT 42.7 36.0 - 46.0 %   MCV 84.1 78.0 - 100.0 fL   MCH 28.3 26.0 - 34.0 pg   MCHC 33.7 30.0 - 36.0 g/dL   RDW 13.7 11.5 - 15.5 %   Platelets 265 150 - 400 K/uL    Comment: Performed at [facility] [address], [place], [place] [address]  Urinalysis, Routine w reflex microscopic     Status: Abnormal   Collection Time: 12/26/17  7:58 PM  Result Value Ref Range   Color, Urine YELLOW YELLOW   APPearance CLEAR CLEAR   Specific Gravity, Urine 1.034 (H) 1.005 - 1.030   pH 5.0 5.0 - 8.0   Glucose, UA 50 (A) NEGATIVE mg/dL   Hgb urine dipstick NEGATIVE NEGATIVE   Bilirubin Urine NEGATIVE NEGATIVE   Ketones, ur NEGATIVE NEGATIVE mg/dL   Protein, ur NEGATIVE NEGATIVE mg/dL   Nitrite NEGATIVE NEGATIVE   Leukocytes, UA NEGATIVE NEGATIVE    Comment: Performed at [facility] [address], [place], [place] [address]  I-stat troponin, ED     Status: None   Collection Time: 12/26/17  8:19 PM  Result Value Ref Range   Troponin i, poc 0.01 0.00 - 0.08 ng/mL   Comment 3            Comment: Due to the release kinetics of cTnI, a negative result within the first hours of the onset of symptoms does not rule out myocardial infarction with certainty. If myocardial infarction is still suspected, repeat the test at appropriate intervals.   Troponin I (q 6hr x 3)     Status: None   Collection Time: 12/27/17 12:34 AM  Result Value Ref Range   Troponin I <0.03 <0.03 ng/mL    Comment: Performed at [facility] [address], [place], [place] [address]  Digoxin level     Status: None   Collection Time: 12/27/17 12:34 AM  Result Value Ref Range   Digoxin Level 1.2 0.8 -  2.0 ng/mL    Comment: Performed at [facility] [address], [place], [place] [address]  CBG monitoring, ED     Status: Abnormal   Collection Time: 12/27/17  2:34 AM  Result Value Ref Range   Glucose-Capillary 377 (H) 65 - 99 mg/dL  Troponin I (q 6hr x 3)  Status: None   Collection Time: 12/27/17  4:57 AM  Result Value Ref Range   Troponin I <0.03 <0.03 ng/mL    Comment: Performed at [facility] [address] [place], [place] [address]  CBG monitoring, ED     Status: Abnormal   Collection Time: 12/27/17  8:24 AM  Result Value Ref Range   Glucose-Capillary 278 (H) 65 - 99 mg/dL    Dg Chest 2 View  Result Date: 12/26/2017 CLINICAL DATA:  62 year old with chest pain and shortness of breath. EXAM: CHEST - 2 VIEW COMPARISON:  01/28/2017 FINDINGS: Stable enlargement of the cardiac silhouette. The lungs are clear without airspace disease or pulmonary edema. Chronic elevation of the right hemidiaphragm. No large pleural effusions. No acute bone abnormality. IMPRESSION: Stable cardiomegaly. No acute findings. Electronically Signed   By: Markus Daft M.D.   On: 12/26/2017 20:58   Ct Abdomen Pelvis W Contrast  Result Date: 12/26/2017 CLINICAL DATA:  62 year old female with left lower quadrant abdominal pain and diarrhea. EXAM: CT ABDOMEN AND PELVIS WITH CONTRAST TECHNIQUE: Multidetector CT imaging of the abdomen and pelvis was performed using the standard protocol following bolus administration of intravenous contrast. CONTRAST:  125m OMNIPAQUE IOHEXOL 300 MG/ML  SOLN COMPARISON:  Right upper quadrant ultrasound dated 04/11/2016, CT of abdomen pelvis dated 01/04/2010, and MRI dated 08/17/2012 FINDINGS: Lower chest: The visualized lung bases are clear. There is mild cardiomegaly. No intra-abdominal free air or free fluid Hepatobiliary: Cholecystectomy. The liver is unremarkable. No intrahepatic biliary ductal dilatation. Pancreas: Unremarkable. No pancreatic ductal dilatation or  surrounding inflammatory changes. Spleen: Normal in size without focal abnormality. Adrenals/Urinary Tract: The adrenal glands are unremarkable. Subcentimeter right renal hypodensities are too small to characterize. There is no hydronephrosis on either side. There is symmetric enhancement and excretion of contrast by both kidneys. The visualized ureters and urinary bladder appear unremarkable. Stomach/Bowel: There is no bowel obstruction or active inflammation. Normal appendix. Vascular/Lymphatic: The abdominal aorta and IVC are unremarkable. No portal venous gas. 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 There is no adenopathy. Reproductive: Hysterectomy. There is a 3.5 x 3.0 cm hypodense lesion the left ovary which is similar to prior CT of 2011, likely a benign or indolent process such as a serous lesion. Other: None Musculoskeletal: Mild degenerative changes of the spine. No acute osseous pathology. IMPRESSION: 1. No acute intra-abdominal or pelvic pathology. No bowel obstruction or active inflammation. Normal appendix. 2. Stable appearance of the left ovarian lesion compared to the CT of 2011. This lesion is not characterized on this CT but likely represents a benign or indolent process such as a serous epithelial lesion given interval stability. 3. Left renal artery aneurysms, unchanged. 4. Mild cardiomegaly. Electronically Signed   By: AAnner CreteM.D.   On: 12/26/2017 21:52    Review of Systems  Constitutional: Positive for diaphoresis. Negative for chills and fever.  HENT: Negative for hearing loss.   Eyes: Negative for blurred vision.  Respiratory: Positive for shortness of breath.   Cardiovascular: Positive for chest pain. Negative for palpitations, orthopnea, claudication and leg swelling.  Gastrointestinal: Positive for abdominal pain. Negative for vomiting.   Genitourinary: Negative for dysuria.  Neurological: Negative for dizziness.   Blood pressure 97/70, pulse (!) 109, temperature 98.4 F (36.9 C), temperature source Oral, resp. rate 18, SpO2 98 %. Physical Exam  Constitutional:  She is oriented to person, place, and time.  Eyes: Pupils are equal, round, and reactive to light. Conjunctivae are normal. Left eye exhibits no discharge. No scleral icterus.  Neck: Normal range of motion. Neck supple. No JVD present. No tracheal deviation present. No thyromegaly present.  Cardiovascular:  Irregularly irregular S1 and S2 soft there is soft systolic murmur noted  Respiratory: Effort normal and breath sounds normal. No respiratory distress. She has no wheezes. She has no rales.  GI: Soft. Bowel sounds are normal. She exhibits distension. There is no tenderness. There is no rebound.  Musculoskeletal: She exhibits no edema, tenderness or deformity.  Neurological: She is alert and oriented to person, place, and time.    Assessment/Plan: Atypical chest pain with some features worrisome for angina MI ruled out Abdominal pain rule out GERD and gastritis/peptic ulcer disease Compensated systolic/diastolic congestive heart failure. Chronic atrial fibrillation. Hypertension. Diabetes mellitus COPD Obstructive sleep apnea/obesity hypoventilation syndrome. Morbid obesity. Chronic kidney disease. History of DVT. Depression. Degenerative joint disease. Plan Will change carvedilol to metoprolol in view of A. Fib with moderate to rapid ventricular response. Will schedule for nuclear stress test. Discussed with patient regarding lifestyle changes compliance with medications and diet etc.   Charolette Forward 12/27/2017, 10:23 AM

## 2017-12-27 NOTE — Progress Notes (Signed)
Inpatient Diabetes Program Recommendations  AACE/ADA: New Consensus Statement on Inpatient Glycemic Control (2015)  Target Ranges:  Prepandial:   less than 140 mg/dL      Peak postprandial:   less than 180 mg/dL (1-2 hours)      Critically ill patients:  140 - 180 mg/dL   Lab Results  Component Value Date   GLUCAP 287 (H) 12/27/2017   HGBA1C 11.2 (H) 04/09/2016    Review of Glycemic ControlResults for BRENDAN, GRUWELL (MRN [id number]) as of 12/27/2017 13:39  Ref. Range 12/27/2017 02:34 12/27/2017 08:24 12/27/2017 13:36  Glucose-Capillary Latest Ref Range: 65 - 99 mg/dL 377 (H) 278 (H) 287 (H)    Diabetes history: DM 2 Outpatient Diabetes medications: Levemir 35 units daily, Novolog 25 units bid Current orders for Inpatient glycemic control:  Levemir 35 units daily, Novolog 25 units bid, Novolog moderate tid with meals  Inpatient Diabetes Program Recommendations:    Please change Novolog to 12 units tid with meals (hold if pt. Eats less than 50% or NPO). Text page sent to MD.   Thanks,  Adah Perl, RN, BC-ADM Inpatient Diabetes Coordinator Pager [phone]

## 2017-12-27 NOTE — ED Notes (Signed)
Pharmacy messaged to verify meds

## 2017-12-28 DIAGNOSIS — R079 Chest pain, unspecified: Secondary | ICD-10-CM | POA: Diagnosis not present

## 2017-12-28 LAB — GLUCOSE, CAPILLARY
Glucose-Capillary: 322 mg/dL — ABNORMAL HIGH (ref 65–99)
Glucose-Capillary: 408 mg/dL — ABNORMAL HIGH (ref 65–99)
Glucose-Capillary: 238 mg/dL — ABNORMAL HIGH (ref 65–99)

## 2017-12-28 LAB — BASIC METABOLIC PANEL
Anion gap: 9 (ref 5–15)
BUN: 8 mg/dL (ref 6–20)
CO2: 30 mmol/L (ref 22–32)
Calcium: 9 mg/dL (ref 8.9–10.3)
Chloride: 100 mmol/L — ABNORMAL LOW (ref 101–111)
Creatinine, Ser: 1.26 mg/dL — ABNORMAL HIGH (ref 0.44–1.00)
GFR calc Af Amer: 52 mL/min — ABNORMAL LOW (ref >60)
GFR calc non Af Amer: 45 mL/min — ABNORMAL LOW (ref >60)
Glucose, Bld: 318 mg/dL — ABNORMAL HIGH (ref 65–99)
Potassium: 4 mmol/L (ref 3.5–5.1)
Sodium: 139 mmol/L (ref 135–145)

## 2017-12-28 MED ORDER — REGADENOSON 0.4 MG/5ML IV SOLN
INTRAVENOUS | Status: AC
  Filled 2017-12-28: qty 5

## 2017-12-28 MED ORDER — TECHNETIUM TC 99M TETROFOSMIN IV KIT
30.0000 | PACK | Freq: Once | INTRAVENOUS | Status: AC | PRN
  Administered 2017-12-28: 30 via INTRAVENOUS

## 2017-12-28 MED ORDER — TECHNETIUM TC 99M TETROFOSMIN IV KIT
30.0000 | PACK | Freq: Once | INTRAVENOUS | Status: AC | PRN
  Administered 2017-12-27: 30 via INTRAVENOUS

## 2017-12-28 MED ORDER — REGADENOSON 0.4 MG/5ML IV SOLN
0.4000 mg | Freq: Once | INTRAVENOUS | Status: AC
Start: 2017-12-28 — End: 2017-12-28
  Administered 2017-12-28: 0.4 mg via INTRAVENOUS

## 2017-12-28 MED ORDER — SUCRALFATE 1 GM/10ML PO SUSP
1.0000 g | Freq: Three times a day (TID) | ORAL | Status: DC
  Administered 2017-12-28 – 2018-01-01 (×16): 1 g via ORAL
  Filled 2017-12-28 (×16): qty 10

## 2017-12-28 MED ORDER — INSULIN ASPART 100 UNIT/ML ~~LOC~~ SOLN
12.0000 [IU] | Freq: Three times a day (TID) | SUBCUTANEOUS | Status: DC
  Administered 2017-12-28 – 2017-12-30 (×5): 12 [IU] via SUBCUTANEOUS

## 2017-12-28 MED ORDER — METOPROLOL TARTRATE 25 MG PO TABS
25.0000 mg | ORAL_TABLET | Freq: Three times a day (TID) | ORAL | Status: DC
Start: 2017-12-28 — End: 2018-01-01
  Administered 2017-12-28 – 2018-01-01 (×11): 25 mg via ORAL
  Filled 2017-12-28 (×12): qty 1

## 2017-12-28 NOTE — Progress Notes (Signed)
Inpatient Diabetes Program Recommendations  AACE/ADA: New Consensus Statement on Inpatient Glycemic Control (2015)  Target Ranges:  Prepandial:   less than 140 mg/dL      Peak postprandial:   less than 180 mg/dL (1-2 hours)      Critically ill patients:  140 - 180 mg/dL   Lab Results  Component Value Date   GLUCAP 322 (H) 12/28/2017   HGBA1C 11.2 (H) 04/09/2016    Review of Glycemic Control Results for [name], [name] (MRN [id number]) as of 12/28/2017 13:49  Ref. Range 12/27/2017 08:24 12/27/2017 13:36 12/27/2017 16:00 12/27/2017 22:11 12/28/2017 07:33  Glucose-Capillary Latest Ref Range: 65 - 99 mg/dL 278 (H) 287 (H) 303 (H) 358 (H) 322 (H)   Diabetes history: Type 2 DM  Outpatient Diabetes medications:  Levemir 35 units daily, Novolog 25 units bid, Januvia 50 mg daily Current orders for Inpatient glycemic control:  Novolog moderate tid with meals, Novolog 12 units tid with meals, Levemir 35 units daily  Inpatient Diabetes Program Recommendations:    May consider increasing Levemir to 25 units bid.  Also please recheck A1C to determine pre-hospitalization glycemic control.   Thanks,  Adah Perl, RN, BC-ADM Inpatient Diabetes Coordinator Pager [phone] (8a-5p)

## 2017-12-28 NOTE — Care Management Obs Status (Signed)
[facility] NOTIFICATION   Patient Details  Name: [name] MRN: [id number] Date of Birth: February 04, 1956   Medicare Observation Status Notification Given:  Yes    Bethena Roys, RN 12/28/2017, 4:00 PM

## 2017-12-28 NOTE — Progress Notes (Signed)
Nutrition Brief Note  Patient identified on the Malnutrition Screening Tool (MST) Report. Minimal weight loss reported over the past year, not significant.  Wt Readings from Last 15 Encounters:  12/28/17 295 lb 8 oz (134 kg)  04/26/17 (!) 303 lb (137.4 kg)  01/28/17 300 lb (136.1 kg)  07/25/16 (!) 330 lb (149.7 kg)  04/12/16 (!) 330 lb 12.8 oz (150 kg)  03/05/15 295 lb (133.8 kg)  10/25/14 (!) 307 lb (139.3 kg)  01/08/14 (!) 343 lb 9.6 oz (155.9 kg)  06/24/13 (!) 392 lb 6.7 oz (178 kg)  05/30/13 (!) 394 lb (178.7 kg)  03/26/13 (!) 386 lb 6.4 oz (175.3 kg)  01/21/13 (!) 365 lb 11.2 oz (165.9 kg)  09/06/12 (!) 400 lb 1.9 oz (181.5 kg)  06/24/12 (!) 387 lb (175.5 kg)  04/18/12 (!) 374 lb 11.2 oz (170 kg)    Body mass index is 46.28 kg/m. Patient meets criteria for class 3, extreme/morbid obesity based on current BMI.   Current diet order is Heart Healthy/CHO modified. Labs and medications reviewed.   No nutrition interventions warranted at this time. If nutrition issues arise, please consult RD.   Molli Barrows, RD, LDN, [place] Pager [phone] After Hours Pager [phone]

## 2017-12-28 NOTE — Progress Notes (Signed)
Patient setup on CPAP at 4 cmH20 and tolerating well. No O2 bleeding in and patient will call if any further assistance needed.

## 2017-12-28 NOTE — Progress Notes (Signed)
PROGRESS NOTE    [name]  ONG:[id number] DOB: 09/07/1955 DOA: 12/26/2017 PCP: Charolette Forward, MD    Brief Narrative: [name] is a 62 y.o. female with history of atrial fibrillation, DVT, diabetes mellitus, CHF, hyperlipidemia, sleep apnea presents to the ER with complaints of chest pain ongoing for last 4 days.  Patient states the chest pain is retrosternal and epigastric with some nausea and burning sensation at times pressure-like.  Present even at rest.  Denies any associated shortness of breath fever chills productive cough.  Says the pain worsened today patient came to the ER.  Patient had a stress test in 2017 which was unremarkable.  ED Course: In the ER EKG was showing A. fib rate controlled troponin was negative since patient was complaining of epigastric pain CT abdomen and pelvis was done which was unremarkable.  LFTs were normal.  Patient continued to complain of chest pain.  Patient admitted for further observation.   Assessment & Plan:   Principal Problem:   Chest pain Active Problems:   Morbid obesity (HCC)   OSA (obstructive sleep apnea)   Chronic atrial fibrillation (HCC)   Diastolic dysfunction   Diabetes mellitus type 2 in obese (HCC)   1-Chest pain, epigastric pain.  LFT normal. Lipase normal.  CT abdomen pelvis no acute abnormalities. Prior cholecystectomy.  Troponin negative.  Start PPI BID./  Stress test first; intermediate risk, some area of possible reversible ischemia. Follow cardio recommendation.  D dimer negative.  Denies melena.  ECHO. Normal EF.  Will add Carafate.  She is still having pain, she is worry about going home   2-DM;  Continue with levemir. Increase novolog to 12 units   3-Chronic diastolic HF. Compensated. On spironolactone.  Lasix prn.   Left Ovarian lesion since 2011. Informed results to patient. She will need to follow up with GYN for further evaluation.   History of sleep apnea on  CPAP. Hyperlipidemia on statins. 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 History of atrial fibrillation presently on Coreg and Pradaxa.    DVT prophylaxis: pradaxa Code Status: full code Family Communication: care discussed with patient.  Disposition Plan: home when work up completed.     Consultants:   Cardiology    Procedures: stress test   echo  Antimicrobials: none  Subjective: She reports episodes of chest pain , also after she eats.    Objective: Vitals:   12/28/17 1153 12/28/17 1154 12/28/17 1155 12/28/17 1530  BP: 103/74 (!) 195/89 (!) 166/43 136/69  Pulse:    (!) 104  Resp:      Temp:      TempSrc:      SpO2:      Weight:      Height:       No intake or output data in the 24 hours ending 12/28/17 1644 Filed Weights   12/27/17 1357 12/28/17 4401  Weight: 134.2 kg (295 lb 12.8 oz) 134 kg (295 lb 8 oz)    Examination:  General exam: NAD Respiratory system: CTA Cardiovascular system: S 1, S 2 RRR Gastrointestinal system: BS present, soft, nt Central nervous system: non focal.  Extremities: symmetric power.  Skin: no rashes.   Data Reviewed: I have personally reviewed following labs and imaging studies  CBC: Recent Labs  Lab 12/26/17 1955  WBC 10.5  HGB 14.4  HCT 42.7  MCV 84.1  PLT 027   Basic Metabolic Panel: Recent Labs  Lab 12/26/17 1955 12/27/17 0935 12/28/17 0853  NA 135 137 139  K 4.4 3.7 4.0  CL 97* 100* 100*  CO2 30 26 30   GLUCOSE 376* 277* 318*  BUN 6 7 8   CREATININE 1.35* 1.24* 1.26*  CALCIUM 9.7 8.9 9.0   GFR: Estimated Creatinine Clearance: 67.1 mL/min (A) (by C-G formula based on SCr of 1.26 mg/dL (H)). Liver Function Tests: Recent Labs  Lab 12/26/17 1955  AST 16  ALT 12*  ALKPHOS 85  BILITOT 0.6  PROT 7.8  ALBUMIN 3.4*   Recent Labs  Lab 12/26/17 1955  LIPASE 24   No results for input(s): AMMONIA in the last 168 hours. Coagulation Profile: No results for input(s): INR, PROTIME in the last 168  hours. Cardiac Enzymes: Recent Labs  Lab 12/27/17 0034 12/27/17 0457 12/27/17 0935  TROPONINI <0.03 <0.03 <0.03   BNP (last 3 results) No results for input(s): PROBNP in the last 8760 hours. HbA1C: No results for input(s): HGBA1C in the last 72 hours. CBG: Recent Labs  Lab 12/27/17 0824 12/27/17 1336 12/27/17 1600 12/27/17 2211 12/28/17 0733  GLUCAP 278* 287* 303* 358* 322*   Lipid Profile: No results for input(s): CHOL, HDL, LDLCALC, TRIG, CHOLHDL, LDLDIRECT in the last 72 hours. Thyroid Function Tests: No results for input(s): TSH, T4TOTAL, FREET4, T3FREE, THYROIDAB in the last 72 hours. Anemia Panel: No results for input(s): VITAMINB12, FOLATE, FERRITIN, TIBC, IRON, RETICCTPCT in the last 72 hours. Sepsis Labs: No results for input(s): PROCALCITON, LATICACIDVEN in the last 168 hours.  No results found for this or any previous visit (from the past 240 hour(s)).       Radiology Studies: Dg Chest 2 View  Result Date: 12/26/2017 CLINICAL DATA:  62 year old with chest pain and shortness of breath. EXAM: CHEST - 2 VIEW COMPARISON:  01/28/2017 FINDINGS: Stable enlargement of the cardiac silhouette. The lungs are clear without airspace disease or pulmonary edema. Chronic elevation of the right hemidiaphragm. No large pleural effusions. No acute bone abnormality. IMPRESSION: Stable cardiomegaly. No acute findings. Electronically Signed   By: Markus Daft M.D.   On: 12/26/2017 20:58   Ct Abdomen Pelvis W Contrast  Result Date: 12/26/2017 CLINICAL DATA:  62 year old female with left lower quadrant abdominal pain and diarrhea. EXAM: CT ABDOMEN AND PELVIS WITH CONTRAST TECHNIQUE: Multidetector CT imaging of the abdomen and pelvis was performed using the standard protocol following bolus administration of intravenous contrast. CONTRAST:  135mL OMNIPAQUE IOHEXOL 300 MG/ML  SOLN COMPARISON:  Right upper quadrant ultrasound dated 04/11/2016, CT of abdomen pelvis dated 01/04/2010, and MRI  dated 08/17/2012 FINDINGS: Lower chest: The visualized lung bases are clear. There is mild cardiomegaly. No intra-abdominal free air or free fluid Hepatobiliary: Cholecystectomy. The liver is unremarkable. No intrahepatic biliary ductal dilatation. Pancreas: Unremarkable. No pancreatic ductal dilatation or surrounding inflammatory changes. Spleen: Normal in size without focal abnormality. Adrenals/Urinary Tract: The adrenal glands are unremarkable. Subcentimeter right renal hypodensities are too small to characterize. There is no hydronephrosis on either side. There is symmetric enhancement and excretion of contrast by both kidneys. The visualized ureters and urinary bladder appear unremarkable. Stomach/Bowel: There is no bowel obstruction or active inflammation. Normal appendix. Vascular/Lymphatic: The abdominal aorta and IVC are unremarkable. No portal venous gas. 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 There is no adenopathy. Reproductive: Hysterectomy. There is a 3.5 x 3.0 cm hypodense lesion the left ovary which is similar to prior CT of 2011, likely  a benign or indolent process such as a serous lesion. Other: None Musculoskeletal: Mild degenerative changes of the spine. No acute osseous pathology. IMPRESSION: 1. No acute intra-abdominal or pelvic pathology. No bowel obstruction or active inflammation. Normal appendix. 2. Stable appearance of the left ovarian lesion compared to the CT of 2011. This lesion is not characterized on this CT but likely represents a benign or indolent process such as a serous epithelial lesion given interval stability. 3. Left renal artery aneurysms, unchanged. 4. Mild cardiomegaly. Electronically Signed   By: Anner Crete M.D.   On: 12/26/2017 21:52   Nm Myocar Multi W/spect W/wall Motion / Ef  Result Date: 12/28/2017 CLINICAL DATA:  62 year old  with chest pain. EXAM: MYOCARDIAL IMAGING WITH SPECT (REST AND PHARMACOLOGIC-STRESS) GATED LEFT VENTRICULAR WALL MOTION STUDY LEFT VENTRICULAR EJECTION FRACTION TECHNIQUE: Standard myocardial SPECT imaging was performed after resting intravenous injection of 10 mCi Tc-22m tetrofosmin. Subsequently, intravenous infusion of Lexiscan was performed under the supervision of the Cardiology staff. At peak effect of the drug, 30 mCi Tc-70m tetrofosmin was injected intravenously and standard myocardial SPECT imaging was performed. Quantitative gated imaging was also performed to evaluate left ventricular wall motion, and estimate left ventricular ejection fraction. COMPARISON:  04/10/2016 FINDINGS: Perfusion: There is concern for mild reversibility involving the apical inferior wall. No other areas are concerning for reversibility or infarct. Wall Motion: Mild hypokinesia in left ventricle most prominent along the inferior apex. Left Ventricular Ejection Fraction: 37 %, previously 54%. End diastolic volume 505 ml End systolic volume 72 ml IMPRESSION: 1. Mild reversibility involving the apical inferior wall. Findings could represent a small area of ischemia. 2. Mild hypokinesia in left ventricle, most prominent along the inferior apex. 3. Left ventricular ejection fraction is 37%, previously 54%. 4. Non invasive risk stratification*: Intermediate *2012 Appropriate Use Criteria for Coronary Revascularization Focused Update: J Am Coll Cardiol. 3976;73(4):193-790. http://content.[email or web address] Electronically Signed   By: Markus Daft M.D.   On: 12/28/2017 14:37        Scheduled Meds: . aspirin EC  325 mg Oral Daily  . clorazepate  7.5 mg Oral Daily  . cycloSPORINE  1 drop Both Eyes BID  . dabigatran  150 mg Oral Q12H  . digoxin  0.25 mg Oral Daily  . DULoxetine  60 mg Oral Daily  . feeding supplement (ENSURE ENLIVE)  237 mL Oral BID BM  . gabapentin  300 mg Oral BID  . insulin aspart   0-15 Units Subcutaneous TID WC  . insulin aspart  12 Units Subcutaneous TID WC  . insulin detemir  35 Units Subcutaneous Daily  . losartan  50 mg Oral Daily  . metoprolol tartrate  25 mg Oral TID  . pantoprazole  40 mg Oral BID  . regadenoson      . rosuvastatin  10 mg Oral Q breakfast  . spironolactone  25 mg Oral Daily  . sucralfate  1 g Oral TID WC & HS   Continuous Infusions:   LOS: 0 days    Time spent: 35 minutes.     Elmarie Shiley, MD [facility] Pager [phone]  If 7PM-7AM, please contact night-coverage [email or web address] Password [facility] 12/28/2017, 4:44 PM

## 2017-12-28 NOTE — Progress Notes (Signed)
Subjective:  Patient denies any anginal chest pain. Seen in nuclear medicine department heart rate better controlled after switching Coreg to metoprolol.  Objective:  Vital Signs in the last 24 hours: Temp:  [97.6 F (36.4 C)-98.5 F (36.9 C)] 98.5 F (36.9 C) (05/10 9562) Pulse Rate:  [99-109] 109 (05/10 0851) Resp:  [16] 16 (05/10 0633) BP: (103-195)/(43-96) 166/43 (05/10 1155) SpO2:  [98 %-100 %] 100 % (05/10 1308) Weight:  [134 kg (295 lb 8 oz)-134.2 kg (295 lb 12.8 oz)] 134 kg (295 lb 8 oz) (05/10 6578)  Intake/Output from previous day: No intake/output data recorded. Intake/Output from this shift: No intake/output data recorded.  Physical Exam: Neck: no adenopathy, no carotid bruit, no JVD, supple, symmetrical, trachea midline and thyroid not enlarged, symmetric, no tenderness/mass/nodules Lungs: clear to auscultation bilaterally Heart: irregularly irregular rhythm, S1, S2 normal and soft systolic murmur noted Abdomen: soft, non-tender; bowel sounds normal; no masses,  no organomegaly Extremities: extremities normal, atraumatic, no cyanosis or edema  Lab Results: Recent Labs    12/26/17 1955  WBC 10.5  HGB 14.4  PLT 265   Recent Labs    12/27/17 0935 12/28/17 0853  NA 137 139  K 3.7 4.0  CL 100* 100*  CO2 26 30  GLUCOSE 277* 318*  BUN 7 8  CREATININE 1.24* 1.26*   Recent Labs    12/27/17 0457 12/27/17 0935  TROPONINI <0.03 <0.03   Hepatic Function Panel Recent Labs    12/26/17 1955  PROT 7.8  ALBUMIN 3.4*  AST 16  ALT 12*  ALKPHOS 85  BILITOT 0.6   No results for input(s): CHOL in the last 72 hours. No results for input(s): PROTIME in the last 72 hours.  Imaging: Imaging results have been reviewed and Dg Chest 2 View  Result Date: 12/26/2017 CLINICAL DATA:  62 year old with chest pain and shortness of breath. EXAM: CHEST - 2 VIEW COMPARISON:  01/28/2017 FINDINGS: Stable enlargement of the cardiac silhouette. The lungs are clear without  airspace disease or pulmonary edema. Chronic elevation of the right hemidiaphragm. No large pleural effusions. No acute bone abnormality. IMPRESSION: Stable cardiomegaly. No acute findings. Electronically Signed   By: Markus Daft M.D.   On: 12/26/2017 20:58   Ct Abdomen Pelvis W Contrast  Result Date: 12/26/2017 CLINICAL DATA:  62 year old female with left lower quadrant abdominal pain and diarrhea. EXAM: CT ABDOMEN AND PELVIS WITH CONTRAST TECHNIQUE: Multidetector CT imaging of the abdomen and pelvis was performed using the standard protocol following bolus administration of intravenous contrast. CONTRAST:  128mL OMNIPAQUE IOHEXOL 300 MG/ML  SOLN COMPARISON:  Right upper quadrant ultrasound dated 04/11/2016, CT of abdomen pelvis dated 01/04/2010, and MRI dated 08/17/2012 FINDINGS: Lower chest: The visualized lung bases are clear. There is mild cardiomegaly. No intra-abdominal free air or free fluid Hepatobiliary: Cholecystectomy. The liver is unremarkable. No intrahepatic biliary ductal dilatation. Pancreas: Unremarkable. No pancreatic ductal dilatation or surrounding inflammatory changes. Spleen: Normal in size without focal abnormality. Adrenals/Urinary Tract: The adrenal glands are unremarkable. Subcentimeter right renal hypodensities are too small to characterize. There is no hydronephrosis on either side. There is symmetric enhancement and excretion of contrast by both kidneys. The visualized ureters and urinary bladder appear unremarkable. Stomach/Bowel: There is no bowel obstruction or active inflammation. Normal appendix. Vascular/Lymphatic: The abdominal aorta and IVC are unremarkable. No portal venous gas. There are two aneurysms of the left renal artery at the renal hilum measuring 2.1 cm and 1.7 cm in maximal diameter not significantly changed compared  to the prior CT. There is no adenopathy. Reproductive: Hysterectomy. There is a 3.5 x 3.0 cm hypodense lesion the left ovary which is similar to prior  CT of 2011, likely a benign or indolent process such as a serous lesion. Other: None Musculoskeletal: Mild degenerative changes of the spine. No acute osseous pathology. IMPRESSION: 1. No acute intra-abdominal or pelvic pathology. No bowel obstruction or active inflammation. Normal appendix. 2. Stable appearance of the left ovarian lesion compared to the CT of 2011. This lesion is not characterized on this CT but likely represents a benign or indolent process such as a serous epithelial lesion given interval stability. 3. Left renal artery aneurysms, unchanged. 4. Mild cardiomegaly. Electronically Signed   By: Anner Crete M.D.   On: 12/26/2017 21:52    Cardiac Studies:  Assessment/Plan:  Atypical chest pain with some features worrisome for angina MI ruled out Abdominal pain rule out GERD and gastritis/peptic ulcer disease Compensated systolic/diastolic congestive heart failure. Chronic atrial fibrillation. Hypertension. Diabetes mellitus COPD Obstructive sleep apnea/obesity hypoventilation syndrome. Morbid obesity. Chronic kidney disease. History of DVT. Depression. Degenerative joint disease. Plan Increase metoprolol to 25 mg 3 times daily Check nuclear stress test results if no evidence of ischemia  Okay to discharge from cardiac point of view   LOS: 0 days    Charolette Forward 12/28/2017, 1:27 PM

## 2017-12-28 NOTE — Progress Notes (Signed)
RT at bedside, talked to pt about CPAP.  Pt stated she  does wear CPAP at home. RT informed pt that RT department doesn't have an available CPAP for her to wear tonight. RT will continue to monitor pt throughout the night.  Informed RN and asked RN to called RT with any concerns.

## 2017-12-29 DIAGNOSIS — Z882 Allergy status to sulfonamides status: Secondary | ICD-10-CM | POA: Diagnosis not present

## 2017-12-29 DIAGNOSIS — I5032 Chronic diastolic (congestive) heart failure: Secondary | ICD-10-CM | POA: Diagnosis present

## 2017-12-29 DIAGNOSIS — N183 Chronic kidney disease, stage 3 (moderate): Secondary | ICD-10-CM | POA: Diagnosis present

## 2017-12-29 DIAGNOSIS — J449 Chronic obstructive pulmonary disease, unspecified: Secondary | ICD-10-CM | POA: Diagnosis present

## 2017-12-29 DIAGNOSIS — E785 Hyperlipidemia, unspecified: Secondary | ICD-10-CM | POA: Diagnosis present

## 2017-12-29 DIAGNOSIS — E662 Morbid (severe) obesity with alveolar hypoventilation: Secondary | ICD-10-CM | POA: Diagnosis present

## 2017-12-29 DIAGNOSIS — Z794 Long term (current) use of insulin: Secondary | ICD-10-CM | POA: Diagnosis not present

## 2017-12-29 DIAGNOSIS — Z8249 Family history of ischemic heart disease and other diseases of the circulatory system: Secondary | ICD-10-CM | POA: Diagnosis not present

## 2017-12-29 DIAGNOSIS — Z90710 Acquired absence of both cervix and uterus: Secondary | ICD-10-CM | POA: Diagnosis not present

## 2017-12-29 DIAGNOSIS — Z7902 Long term (current) use of antithrombotics/antiplatelets: Secondary | ICD-10-CM | POA: Diagnosis not present

## 2017-12-29 DIAGNOSIS — K210 Gastro-esophageal reflux disease with esophagitis: Secondary | ICD-10-CM | POA: Diagnosis present

## 2017-12-29 DIAGNOSIS — Z7901 Long term (current) use of anticoagulants: Secondary | ICD-10-CM | POA: Diagnosis not present

## 2017-12-29 DIAGNOSIS — I130 Hypertensive heart and chronic kidney disease with heart failure and stage 1 through stage 4 chronic kidney disease, or unspecified chronic kidney disease: Secondary | ICD-10-CM | POA: Diagnosis present

## 2017-12-29 DIAGNOSIS — M1990 Unspecified osteoarthritis, unspecified site: Secondary | ICD-10-CM | POA: Diagnosis present

## 2017-12-29 DIAGNOSIS — R079 Chest pain, unspecified: Secondary | ICD-10-CM | POA: Diagnosis present

## 2017-12-29 DIAGNOSIS — Z86718 Personal history of other venous thrombosis and embolism: Secondary | ICD-10-CM | POA: Diagnosis not present

## 2017-12-29 DIAGNOSIS — Z6841 Body Mass Index (BMI) 40.0 and over, adult: Secondary | ICD-10-CM | POA: Diagnosis not present

## 2017-12-29 DIAGNOSIS — F329 Major depressive disorder, single episode, unspecified: Secondary | ICD-10-CM | POA: Diagnosis present

## 2017-12-29 DIAGNOSIS — R0789 Other chest pain: Secondary | ICD-10-CM | POA: Diagnosis present

## 2017-12-29 DIAGNOSIS — I2510 Atherosclerotic heart disease of native coronary artery without angina pectoris: Secondary | ICD-10-CM | POA: Diagnosis present

## 2017-12-29 DIAGNOSIS — E1122 Type 2 diabetes mellitus with diabetic chronic kidney disease: Secondary | ICD-10-CM | POA: Diagnosis present

## 2017-12-29 DIAGNOSIS — Z9049 Acquired absence of other specified parts of digestive tract: Secondary | ICD-10-CM | POA: Diagnosis not present

## 2017-12-29 DIAGNOSIS — N839 Noninflammatory disorder of ovary, fallopian tube and broad ligament, unspecified: Secondary | ICD-10-CM | POA: Diagnosis present

## 2017-12-29 DIAGNOSIS — I482 Chronic atrial fibrillation: Secondary | ICD-10-CM | POA: Diagnosis present

## 2017-12-29 DIAGNOSIS — E1165 Type 2 diabetes mellitus with hyperglycemia: Secondary | ICD-10-CM | POA: Diagnosis present

## 2017-12-29 LAB — GLUCOSE, CAPILLARY
Glucose-Capillary: 256 mg/dL — ABNORMAL HIGH (ref 65–99)
Glucose-Capillary: 314 mg/dL — ABNORMAL HIGH (ref 65–99)
Glucose-Capillary: 303 mg/dL — ABNORMAL HIGH (ref 65–99)
Glucose-Capillary: 311 mg/dL — ABNORMAL HIGH (ref 65–99)

## 2017-12-29 LAB — TROPONIN I
Troponin I: <0.03 ng/mL (ref <0.03)
Troponin I: <0.03 ng/mL
Troponin I: <0.03 ng/mL

## 2017-12-29 MED ORDER — ASPIRIN EC 81 MG PO TBEC
81.0000 mg | DELAYED_RELEASE_TABLET | Freq: Every day | ORAL | Status: DC
  Administered 2017-12-30 – 2018-01-01 (×2): 81 mg via ORAL
  Filled 2017-12-29 (×2): qty 1

## 2017-12-29 MED ORDER — INSULIN ASPART 100 UNIT/ML ~~LOC~~ SOLN
5.0000 [IU] | Freq: Once | SUBCUTANEOUS | Status: AC
  Administered 2017-12-29: 5 [IU] via SUBCUTANEOUS

## 2017-12-29 MED ORDER — HEPARIN BOLUS VIA INFUSION
4000.0000 [IU] | Freq: Once | INTRAVENOUS | Status: AC
  Administered 2017-12-29: 4000 [IU] via INTRAVENOUS
  Filled 2017-12-29: qty 4000

## 2017-12-29 MED ORDER — ISOSORBIDE MONONITRATE ER 30 MG PO TB24
30.0000 mg | ORAL_TABLET | Freq: Every day | ORAL | Status: DC
  Administered 2017-12-29 – 2017-12-31 (×3): 30 mg via ORAL
  Filled 2017-12-29 (×3): qty 1

## 2017-12-29 MED ORDER — HEPARIN (PORCINE) IN NACL 100-0.45 UNIT/ML-% IJ SOLN
1300.0000 [IU]/h | INTRAMUSCULAR | Status: DC
  Administered 2017-12-29: 1100 [IU]/h via INTRAVENOUS
  Administered 2017-12-30 – 2017-12-31 (×2): 1300 [IU]/h via INTRAVENOUS
  Filled 2017-12-29 (×3): qty 250

## 2017-12-29 MED ORDER — MORPHINE SULFATE (PF) 2 MG/ML IV SOLN
1.0000 mg | INTRAVENOUS | Status: DC | PRN
  Administered 2017-12-29 – 2018-01-01 (×12): 1 mg via INTRAVENOUS
  Filled 2017-12-29 (×12): qty 1

## 2017-12-29 NOTE — Progress Notes (Signed)
Sent msg tro [facility], no coverage.

## 2017-12-29 NOTE — Progress Notes (Signed)
PROGRESS NOTE    [name]  ZOX:[id number] DOB: [birth year] DOA: 12/26/2017 PCP: Charolette Forward, MD    Brief Narrative: [name] is a 62 y.o. female with history of atrial fibrillation, DVT, diabetes mellitus, CHF, hyperlipidemia, sleep apnea presents to the ER with complaints of chest pain ongoing for last 4 days.  Patient states the chest pain is retrosternal and epigastric with some nausea and burning sensation at times pressure-like.  Present even at rest.  Denies any associated shortness of breath fever chills productive cough.  Says the pain worsened today patient came to the ER.  Patient had a stress test in 2017 which was unremarkable.  ED Course: In the ER EKG was showing A. fib rate controlled troponin was negative since patient was complaining of epigastric pain CT abdomen and pelvis was done which was unremarkable.  LFTs were normal.  Patient continued to complain of chest pain.  Patient admitted for further observation.   Assessment & Plan:   Principal Problem:   Chest pain Active Problems:   Morbid obesity (HCC)   OSA (obstructive sleep apnea)   Chronic atrial fibrillation (HCC)   Diastolic dysfunction   Diabetes mellitus type 2 in obese (HCC)   1-Chest pain, epigastric pain. Abnormal stress test.  LFT normal. Lipase normal.  CT abdomen pelvis no acute abnormalities. Prior cholecystectomy.  Troponin negative.  Started  PPI BID./ Carafate Stress test first; intermediate risk, some area of possible reversible ischemia. Follow cardio recommendation.  D dimer negative.  ECHO. Normal EF.  Having chest pain this am. 8/10. Will received second dose of nitroglycerin. Received metoprolol. Morphine PRN. Discussed with Dr Terrence Dupont, will start heparin Gtt, discontinue pradaxa. anticipating  cath on Monday. Cycle enzyme. Repeated EKG no ST elevation.  On metoprolol and Crestor.   2-DM;  Continue with levemir. Increased  novolog to 12 units also getting SSI.    3-Chronic diastolic HF. Compensated. On spironolactone.  Lasix prn.   Left Ovarian lesion since 2011. Informed results to patient. She will need to follow up with GYN for further evaluation.   History of sleep apnea on CPAP. Hyperlipidemia on statins. 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 History of atrial fibrillation presently on Coreg and Pradaxa.    DVT prophylaxis: pradaxa Code Status: full code Family Communication: care discussed with patient.  Disposition Plan: home when work up completed.     Consultants:   Cardiology    Procedures: stress test   echo  Antimicrobials: none  Subjective: Complaining of chest pain, 9/10, received nitroglycerin.  Will received second dose nitroglycerin.     Objective: Vitals:   12/29/17 0054 12/29/17 0538 12/29/17 0749 12/29/17 0826  BP: (!) 156/85 (!) 142/93 (!) 144/70   Pulse: 88 78 96 93  Resp: 18 18    Temp: 98.5 F (36.9 C) 98.7 F (37.1 C) 98.5 F (36.9 C)   TempSrc: Oral Oral Oral   SpO2: 95% 93% 95%   Weight:  134.4 kg (296 lb 6.4 oz)    Height:        Intake/Output Summary (Last 24 hours) at 12/29/2017 0826 Last data filed at 12/28/2017 2130 Gross per 24 hour  Intake 360 ml  Output -  Net 360 ml   Filed Weights   12/27/17 1357 12/28/17 0633 12/29/17 0538  Weight: 134.2 kg (295 lb 12.8 oz) 134 kg (295 lb 8 oz) 134.4 kg (296 lb 6.4 oz)    Examination:  General exam: NAD Respiratory system; CTA Cardiovascular system: S 1, S  2 RRR Gastrointestinal system: BS present, soft, nt Central nervous system:  Non focal.  Extremities: symmetric power.  Skin: no rashes.   Data Reviewed: I have personally reviewed following labs and imaging studies  CBC: Recent Labs  Lab 12/26/17 1955  WBC 10.5  HGB 14.4  HCT 42.7  MCV 84.1  PLT 469   Basic Metabolic Panel: Recent Labs  Lab 12/26/17 1955 12/27/17 0935 12/28/17 0853  NA 135 137 139  K 4.4 3.7 4.0  CL 97* 100* 100*  CO2 30 26 30   GLUCOSE 376* 277*  318*  BUN 6 7 8   CREATININE 1.35* 1.24* 1.26*  CALCIUM 9.7 8.9 9.0   GFR: Estimated Creatinine Clearance: 67.1 mL/min (A) (by C-G formula based on SCr of 1.26 mg/dL (H)). Liver Function Tests: Recent Labs  Lab 12/26/17 1955  AST 16  ALT 12*  ALKPHOS 85  BILITOT 0.6  PROT 7.8  ALBUMIN 3.4*   Recent Labs  Lab 12/26/17 1955  LIPASE 24   No results for input(s): AMMONIA in the last 168 hours. Coagulation Profile: No results for input(s): INR, PROTIME in the last 168 hours. Cardiac Enzymes: Recent Labs  Lab 12/27/17 0034 12/27/17 0457 12/27/17 0935  TROPONINI <0.03 <0.03 <0.03   BNP (last 3 results) No results for input(s): PROBNP in the last 8760 hours. HbA1C: No results for input(s): HGBA1C in the last 72 hours. CBG: Recent Labs  Lab 12/27/17 2211 12/28/17 0733 12/28/17 1711 12/28/17 2106 12/29/17 0745  GLUCAP 358* 322* 408* 238* 256*   Lipid Profile: No results for input(s): CHOL, HDL, LDLCALC, TRIG, CHOLHDL, LDLDIRECT in the last 72 hours. Thyroid Function Tests: No results for input(s): TSH, T4TOTAL, FREET4, T3FREE, THYROIDAB in the last 72 hours. Anemia Panel: No results for input(s): VITAMINB12, FOLATE, FERRITIN, TIBC, IRON, RETICCTPCT in the last 72 hours. Sepsis Labs: No results for input(s): PROCALCITON, LATICACIDVEN in the last 168 hours.  No results found for this or any previous visit (from the past 240 hour(s)).       Radiology Studies: Nm Myocar Multi W/spect W/wall Motion / Ef  Result Date: 12/28/2017 CLINICAL DATA:  62 year old with chest pain. EXAM: MYOCARDIAL IMAGING WITH SPECT (REST AND PHARMACOLOGIC-STRESS) GATED LEFT VENTRICULAR WALL MOTION STUDY LEFT VENTRICULAR EJECTION FRACTION TECHNIQUE: Standard myocardial SPECT imaging was performed after resting intravenous injection of 10 mCi Tc-33m tetrofosmin. Subsequently, intravenous infusion of Lexiscan was performed under the supervision of the Cardiology staff. At peak effect of the  drug, 30 mCi Tc-64m tetrofosmin was injected intravenously and standard myocardial SPECT imaging was performed. Quantitative gated imaging was also performed to evaluate left ventricular wall motion, and estimate left ventricular ejection fraction. COMPARISON:  04/10/2016 FINDINGS: Perfusion: There is concern for mild reversibility involving the apical inferior wall. No other areas are concerning for reversibility or infarct. Wall Motion: Mild hypokinesia in left ventricle most prominent along the inferior apex. Left Ventricular Ejection Fraction: 37 %, previously 54%. End diastolic volume 629 ml End systolic volume 72 ml IMPRESSION: 1. Mild reversibility involving the apical inferior wall. Findings could represent a small area of ischemia. 2. Mild hypokinesia in left ventricle, most prominent along the inferior apex. 3. Left ventricular ejection fraction is 37%, previously 54%. 4. Non invasive risk stratification*: Intermediate *2012 Appropriate Use Criteria for Coronary Revascularization Focused Update: J Am Coll Cardiol. 5284;13(2):440-102. http://[email or web address] Electronically Signed   By: Markus Daft M.D.   On: 12/28/2017 14:37        Scheduled Meds: . aspirin EC  325  mg Oral Daily  . clorazepate  7.5 mg Oral Daily  . cycloSPORINE  1 drop Both Eyes BID  . digoxin  0.25 mg Oral Daily  . DULoxetine  60 mg Oral Daily  . feeding supplement (ENSURE ENLIVE)  237 mL Oral BID BM  . gabapentin  300 mg Oral BID  . insulin aspart  0-15 Units Subcutaneous TID WC  . insulin aspart  12 Units Subcutaneous TID WC  . insulin detemir  35 Units Subcutaneous Daily  . losartan  50 mg Oral Daily  . metoprolol tartrate  25 mg Oral TID  . pantoprazole  40 mg Oral BID  . rosuvastatin  10 mg Oral Q breakfast  . spironolactone  25 mg Oral Daily  . sucralfate  1 g Oral TID WC & HS   Continuous Infusions:   LOS: 0 days    Time spent: 35 minutes.     Elmarie Shiley,  MD [facility] Pager [phone]  If 7PM-7AM, please contact night-coverage [email or web address] Password [facility] 12/29/2017, 8:26 AM

## 2017-12-29 NOTE — Progress Notes (Signed)
Subjective:  Patient continues to have recurrent chest pain although some features very atypical nuclear stressors showed small area of reversible ischemia in the inferior affects with EF of 37% as compared to normal EF in the past.  Objective:  Vital Signs in the last 24 hours: Temp:  [97.6 F (36.4 C)-98.7 F (37.1 C)] 98.5 F (36.9 C) (05/11 0749) Pulse Rate:  [57-104] 92 (05/11 0828) Resp:  [18] 18 (05/11 0538) BP: (103-195)/(43-93) 113/75 (05/11 0905) SpO2:  [90 %-95 %] 95 % (05/11 0749) Weight:  [134.4 kg (296 lb 6.4 oz)] 134.4 kg (296 lb 6.4 oz) (05/11 0538)  Intake/Output from previous day: 05/10 0701 - 05/11 0700 In: 360 [P.O.:360] Out: -  Intake/Output from this shift: No intake/output data recorded.  Physical Exam: Neck: no adenopathy, no carotid bruit, no JVD and supple, symmetrical, trachea midline Lungs: clear to auscultation bilaterally Heart: irregularly irregular rhythm, S1, S2 normal and Soft systolic murmur noted Abdomen: soft, non-tender; bowel sounds normal; no masses,  no organomegaly Extremities: extremities normal, atraumatic, no cyanosis or edema  Lab Results: Recent Labs    12/26/17 1955  WBC 10.5  HGB 14.4  PLT 265   Recent Labs    12/27/17 0935 12/28/17 0853  NA 137 139  K 3.7 4.0  CL 100* 100*  CO2 26 30  GLUCOSE 277* 318*  BUN 7 8  CREATININE 1.24* 1.26*   Recent Labs    12/27/17 0457 12/27/17 0935  TROPONINI <0.03 <0.03   Hepatic Function Panel Recent Labs    12/26/17 1955  PROT 7.8  ALBUMIN 3.4*  AST 16  ALT 12*  ALKPHOS 85  BILITOT 0.6   No results for input(s): CHOL in the last 72 hours. No results for input(s): PROTIME in the last 72 hours.  Imaging: Imaging results have been reviewed and Nm Myocar Multi W/spect W/wall Motion / Ef  Result Date: 12/28/2017 CLINICAL DATA:  62 year old with chest pain. EXAM: MYOCARDIAL IMAGING WITH SPECT (REST AND PHARMACOLOGIC-STRESS) GATED LEFT VENTRICULAR WALL MOTION STUDY LEFT  VENTRICULAR EJECTION FRACTION TECHNIQUE: Standard myocardial SPECT imaging was performed after resting intravenous injection of 10 mCi Tc-85m tetrofosmin. Subsequently, intravenous infusion of Lexiscan was performed under the supervision of the Cardiology staff. At peak effect of the drug, 30 mCi Tc-63m tetrofosmin was injected intravenously and standard myocardial SPECT imaging was performed. Quantitative gated imaging was also performed to evaluate left ventricular wall motion, and estimate left ventricular ejection fraction. COMPARISON:  04/10/2016 FINDINGS: Perfusion: There is concern for mild reversibility involving the apical inferior wall. No other areas are concerning for reversibility or infarct. Wall Motion: Mild hypokinesia in left ventricle most prominent along the inferior apex. Left Ventricular Ejection Fraction: 37 %, previously 54%. End diastolic volume 027 ml End systolic volume 72 ml IMPRESSION: 1. Mild reversibility involving the apical inferior wall. Findings could represent a small area of ischemia. 2. Mild hypokinesia in left ventricle, most prominent along the inferior apex. 3. Left ventricular ejection fraction is 37%, previously 54%. 4. Non invasive risk stratification*: Intermediate *2012 Appropriate Use Criteria for Coronary Revascularization Focused Update: J Am Coll Cardiol. 2536;64(4):034-742. http://content.[email or web address]?[email or web address] Electronically Signed   By: Markus Daft M.D.   On: 12/28/2017 14:37    Cardiac Studies:  Assessment/Plan:  Atypical chest pain with some features worrisome for angina MI ruled out Status post Abdominal pain rule out GERD and gastritis/peptic ulcer disease Compensated systolic/diastolic congestive heart failure. Chronic atrial fibrillation. Hypertension. Diabetes mellitus COPD Obstructive sleep apnea/obesity hypoventilation syndrome. Morbid obesity.  Chronic kidney disease. History of DVT. Depression. Degenerative joint  disease. Plan Discussed with patient regarding mildly abnormal nuclear stress test and various options of treatment I.e. Medical versus invasive left cath possible PTCA stenting its risk and benefits I.e. Death MI stroke need for emergency CABG local vascular complications etc. And consents for PCI. Will hold Pradaxa and start her on heparin per pharmacy protocol will schedule her for Monday    LOS: 0 days    Charolette Forward 12/29/2017, 10:03 AM

## 2017-12-29 NOTE — Progress Notes (Addendum)
ANTICOAGULATION CONSULT NOTE - Initial Consult  Pharmacy Consult for heparin Indication: chest pain/ACS  Allergies  Allergen Reactions  . Nsaids Other (See Comments)    On warfarin  . Sulfa Antibiotics Itching    Patient Measurements: Height: 5\' 7"  (170.2 cm) Weight: 296 lb 6.4 oz (134.4 kg) IBW/kg (Calculated) : 61.6 Heparin Dosing Weight: 94 kg  Vital Signs: Temp: 98.5 F (36.9 C) (05/11 0749) Temp Source: Oral (05/11 0749) BP: 144/70 (05/11 0749) Pulse Rate: 92 (05/11 0828)  Labs: Recent Labs    12/26/17 1955 12/27/17 0034 12/27/17 0457 12/27/17 0935 12/28/17 0853  HGB 14.4  --   --   --   --   HCT 42.7  --   --   --   --   PLT 265  --   --   --   --   CREATININE 1.35*  --   --  1.24* 1.26*  TROPONINI  --  <0.03 <0.03 <0.03  --     Estimated Creatinine Clearance: 67.1 mL/min (A) (by C-G formula based on SCr of 1.26 mg/dL (H)).   Medical History: Past Medical History:  Diagnosis Date  . Allergic rhinitis   . Arthritis   . Asthma   . Chest pain 12/27/2017  . Chronic diastolic CHF (congestive heart failure) ([place])   . COPD (chronic obstructive pulmonary disease) ([place])   . Depression   . DM (diabetes mellitus) ([place])   . DVT (deep vein thrombosis) in pregnancy ([place])   . Dysrhythmia    atrial fibrilation  . GERD (gastroesophageal reflux disease)   . Headache(784.0)   . HTN (hypertension)   . Hyperlipidemia   . Obesity   . Shortness of breath   . Sleep apnea    compliant with CPAP    Medications:  Scheduled:  . aspirin EC  325 mg Oral Daily  . clorazepate  7.5 mg Oral Daily  . cycloSPORINE  1 drop Both Eyes BID  . digoxin  0.25 mg Oral Daily  . DULoxetine  60 mg Oral Daily  . feeding supplement (ENSURE ENLIVE)  237 mL Oral BID BM  . gabapentin  300 mg Oral BID  . heparin  4,000 Units Intravenous Once  . insulin aspart  0-15 Units Subcutaneous TID WC  . insulin aspart  12 Units Subcutaneous TID WC  . insulin detemir  35 Units Subcutaneous Daily   . losartan  50 mg Oral Daily  . metoprolol tartrate  25 mg Oral TID  . pantoprazole  40 mg Oral BID  . rosuvastatin  10 mg Oral Q breakfast  . spironolactone  25 mg Oral Daily  . sucralfate  1 g Oral TID WC & HS   Infusions:  . heparin      Assessment: 27 YOF admitted with chest pain, on dabigatran PTA for history of Afib and DVT. She received a dose of dabigatran 5/11 ~0830. Patient had recurrent chest pain this morning (8/10 pain), received 2 doses NTG SL. Pharmacy consulted to start heparin in anticipation of cath on Monday. CBC is wnl, no bleeding noted.  Goal of Therapy:  Heparin level 0.3-0.7 units/ml Monitor platelets by anticoagulation protocol: Yes   Plan:  Begin heparin ~12 hours after last dabigatran dose  Heparin bolus 4000 units x 1 - to begin 5/11 at 2100 Heparin gtt 1100 units/hr Check heparin level with AM labs Daily heparin level and CBC Follow up cath plans (likely Monday)   Charlene Brooke, PharmD PGY1 Pharmacy Resident Phone: [phone]  After 3:30PM please call [place] [phone] 12/29/2017,8:48 AM

## 2017-12-29 NOTE — Progress Notes (Signed)
Dr. Terrence Dupont called back for continuing chest pain after Morphine and 3 NTG SL  Tablets. Dr. Terrence Dupont to come and see pt. Pt alos c/o of sharp pain onher chest when she moves or take a deep breath.DDimer at 0.27.MD aware.

## 2017-12-29 NOTE — Progress Notes (Signed)
Patient refused CPAP tonight. There Is a machine in the room at this time. RN aware. Explained to Patient that if they changed their mind, to just have the RN call Respiratory and we would come set them up. 

## 2017-12-29 NOTE — Progress Notes (Addendum)
Pt had c/o of on and off chest pain since last HS. EKG done per protocol. Nitroglycerine 1 tab SL given- 1st dose. Dr. Tyrell Antonio updated and in the room. Will update Dr. Terrence Dupont.Dr. Terrence Dupont updated by Dr. Tyrell Antonio.

## 2017-12-30 LAB — LIPID PANEL
Cholesterol: 106 mg/dL (ref 0–200)
HDL: 34 mg/dL — ABNORMAL LOW (ref >40)
LDL Cholesterol: 52 mg/dL (ref 0–99)
Total CHOL/HDL Ratio: 3.1 RATIO
Triglycerides: 102 mg/dL (ref <150)
VLDL: 20 mg/dL (ref 0–40)

## 2017-12-30 LAB — HEPARIN LEVEL (UNFRACTIONATED)
Heparin Unfractionated: 0.19 IU/mL — ABNORMAL LOW (ref 0.30–0.70)
Heparin Unfractionated: 0.44 IU/mL (ref 0.30–0.70)
Heparin Unfractionated: 0.43 IU/mL (ref 0.30–0.70)

## 2017-12-30 LAB — BASIC METABOLIC PANEL
Anion gap: 7 (ref 5–15)
BUN: 11 mg/dL (ref 6–20)
CO2: 31 mmol/L (ref 22–32)
Calcium: 9 mg/dL (ref 8.9–10.3)
Chloride: 99 mmol/L — ABNORMAL LOW (ref 101–111)
Creatinine, Ser: 1.22 mg/dL — ABNORMAL HIGH (ref 0.44–1.00)
GFR calc Af Amer: 54 mL/min — ABNORMAL LOW (ref >60)
GFR calc non Af Amer: 47 mL/min — ABNORMAL LOW (ref >60)
Glucose, Bld: 379 mg/dL — ABNORMAL HIGH (ref 65–99)
Potassium: 4.3 mmol/L (ref 3.5–5.1)
Sodium: 137 mmol/L (ref 135–145)

## 2017-12-30 LAB — CBC
HCT: 41.3 % (ref 36.0–46.0)
Hemoglobin: 13.7 g/dL (ref 12.0–15.0)
MCH: 28.1 pg (ref 26.0–34.0)
MCHC: 33.2 g/dL (ref 30.0–36.0)
MCV: 84.6 fL (ref 78.0–100.0)
Platelets: 221 10*3/uL (ref 150–400)
RBC: 4.88 MIL/uL (ref 3.87–5.11)
RDW: 13.8 % (ref 11.5–15.5)
WBC: 9.6 10*3/uL (ref 4.0–10.5)

## 2017-12-30 LAB — GLUCOSE, CAPILLARY
Glucose-Capillary: 352 mg/dL — ABNORMAL HIGH (ref 65–99)
Glucose-Capillary: 334 mg/dL — ABNORMAL HIGH (ref 65–99)
Glucose-Capillary: 302 mg/dL — ABNORMAL HIGH (ref 65–99)
Glucose-Capillary: 285 mg/dL — ABNORMAL HIGH (ref 65–99)

## 2017-12-30 MED ORDER — INSULIN ASPART 100 UNIT/ML ~~LOC~~ SOLN
5.0000 [IU] | Freq: Once | SUBCUTANEOUS | Status: AC
  Administered 2017-12-30: 5 [IU] via SUBCUTANEOUS

## 2017-12-30 MED ORDER — ACTIVE PARTNERSHIP FOR HEALTH OF YOUR HEART BOOK
Freq: Once | Status: DC
  Filled 2017-12-30: qty 1

## 2017-12-30 MED ORDER — ASPIRIN 81 MG PO CHEW
81.0000 mg | CHEWABLE_TABLET | ORAL | Status: AC
  Administered 2017-12-31: 81 mg via ORAL
  Filled 2017-12-30: qty 1

## 2017-12-30 MED ORDER — SODIUM CHLORIDE 0.9 % IV SOLN: 250.0000 mL | INTRAVENOUS | Status: DC | PRN

## 2017-12-30 MED ORDER — HEPARIN BOLUS VIA INFUSION
2800.0000 [IU] | Freq: Once | INTRAVENOUS | Status: AC
Start: 2017-12-30 — End: 2017-12-30
  Administered 2017-12-30: 2800 [IU] via INTRAVENOUS
  Filled 2017-12-30: qty 2800

## 2017-12-30 MED ORDER — CLOPIDOGREL BISULFATE 75 MG PO TABS
300.0000 mg | ORAL_TABLET | ORAL | Status: AC
  Administered 2017-12-31: 300 mg via ORAL
  Filled 2017-12-30 (×2): qty 4

## 2017-12-30 MED ORDER — SODIUM CHLORIDE 0.9 % WEIGHT BASED INFUSION
1.0000 mL/kg/h | INTRAVENOUS | Status: DC
  Administered 2017-12-31: 250 mL via INTRAVENOUS
  Administered 2017-12-31 (×2): 1 mL/kg/h via INTRAVENOUS

## 2017-12-30 MED ORDER — SODIUM CHLORIDE 0.9% FLUSH: 3.0000 mL | INTRAVENOUS | Status: DC | PRN

## 2017-12-30 MED ORDER — LIVING BETTER WITH HEART FAILURE BOOK: Freq: Once | Status: DC

## 2017-12-30 MED ORDER — SODIUM CHLORIDE 0.9% FLUSH
3.0000 mL | Freq: Two times a day (BID) | INTRAVENOUS | Status: DC
  Administered 2017-12-30 – 2017-12-31 (×2): 3 mL via INTRAVENOUS

## 2017-12-30 MED ORDER — INSULIN ASPART 100 UNIT/ML ~~LOC~~ SOLN
15.0000 [IU] | Freq: Three times a day (TID) | SUBCUTANEOUS | Status: DC
  Administered 2017-12-30 – 2018-01-01 (×4): 15 [IU] via SUBCUTANEOUS

## 2017-12-30 NOTE — Progress Notes (Signed)
ANTICOAGULATION CONSULT NOTE - Initial Consult  Pharmacy Consult for heparin Indication: chest pain/ACS  Allergies  Allergen Reactions  . Nsaids Other (See Comments)    On warfarin  . Sulfa Antibiotics Itching    Patient Measurements: Height: 5\' 7"  (170.2 cm) Weight: 296 lb 9.6 oz (134.5 kg) IBW/kg (Calculated) : 61.6 Heparin Dosing Weight: 94 kg  Vital Signs: Temp: 98.3 F (36.8 C) (05/12 0515) BP: 140/86 (05/12 0912) Pulse Rate: 100 (05/12 0912)  Labs: Recent Labs    12/27/17 0935 12/28/17 0853 12/29/17 0847 12/29/17 1422 12/29/17 2002 12/30/17 0436  HGB  --   --   --   --   --  13.7  HCT  --   --   --   --   --  41.3  PLT  --   --   --   --   --  221  HEPARINUNFRC  --   --   --   --   --  0.19*  CREATININE 1.24* 1.26*  --   --   --   --   TROPONINI <0.03  --  <0.03 <0.03 <0.03  --     Estimated Creatinine Clearance: 67.2 mL/min (A) (by C-G formula based on SCr of 1.26 mg/dL (H)).   Medical History: Past Medical History:  Diagnosis Date  . Allergic rhinitis   . Arthritis   . Asthma   . Chest pain 12/27/2017  . Chronic diastolic CHF (congestive heart failure) ([place])   . COPD (chronic obstructive pulmonary disease) ([place])   . Depression   . DM (diabetes mellitus) ([place])   . DVT (deep vein thrombosis) in pregnancy ([place])   . Dysrhythmia    atrial fibrilation  . GERD (gastroesophageal reflux disease)   . Headache(784.0)   . HTN (hypertension)   . Hyperlipidemia   . Obesity   . Shortness of breath   . Sleep apnea    compliant with CPAP    Medications:  Scheduled:  . active partnership for health of your heart book   Does not apply Once  . aspirin EC  81 mg Oral Daily  . clorazepate  7.5 mg Oral Daily  . cycloSPORINE  1 drop Both Eyes BID  . digoxin  0.25 mg Oral Daily  . DULoxetine  60 mg Oral Daily  . feeding supplement (ENSURE ENLIVE)  237 mL Oral BID BM  . gabapentin  300 mg Oral BID  . heparin  2,800 Units Intravenous Once  . insulin aspart   0-15 Units Subcutaneous TID WC  . insulin aspart  12 Units Subcutaneous TID WC  . insulin detemir  35 Units Subcutaneous Daily  . isosorbide mononitrate  30 mg Oral Daily  . Living Better with Heart Failure Book   Does not apply Once  . losartan  50 mg Oral Daily  . metoprolol tartrate  25 mg Oral TID  . pantoprazole  40 mg Oral BID  . rosuvastatin  10 mg Oral Q breakfast  . spironolactone  25 mg Oral Daily  . sucralfate  1 g Oral TID WC & HS   Infusions:  . heparin 1,100 Units/hr (12/29/17 2144)    Assessment: 61 YOF admitted with chest pain, on dabigatran PTA for history of Afib and DVT. Transitioned to IV heparin on 5/11 in anticipation for cath on 5/13. Initial heparin level is low 0.19, no known issues with drip per RN. CBC is wnl, no bleeding noted.  Goal of Therapy:  Heparin level 0.3-0.7  units/ml Monitor platelets by anticoagulation protocol: Yes   Plan:  Heparin bolus 2800 units x 1 Increase heparin gtt to 1300 units/hr Check 6-hour heparin level Monitor daily heparin level, CBC, s/sx of bleeding Cath Monday - follow up results/plans   Charlene Brooke, PharmD PGY1 Pharmacy Resident Phone: [phone] After 3:30PM please call [place] 12/30/2017,9:30 AM

## 2017-12-30 NOTE — Progress Notes (Signed)
ANTICOAGULATION CONSULT NOTE - Follow-Up  Pharmacy Consult for heparin Indication: chest pain/ACS  Patient Measurements: Height: 5\' 7"  (170.2 cm) Weight: 296 lb 9.6 oz (134.5 kg) IBW/kg (Calculated) : 61.6 Heparin Dosing Weight: 94 kg  Vital Signs: Temp: 98.3 F (36.8 C) (05/12 0515) BP: 115/95 (05/12 1143) Pulse Rate: 82 (05/12 1143)  Labs: Recent Labs    12/28/17 0853 12/29/17 0847 12/29/17 1422 12/29/17 2002 12/30/17 0436 12/30/17 1225 12/30/17 1522  HGB  --   --   --   --  13.7  --   --   HCT  --   --   --   --  41.3  --   --   PLT  --   --   --   --  221  --   --   HEPARINUNFRC  --   --   --   --  0.19*  --  0.44  CREATININE 1.26*  --   --   --   --  1.22*  --   TROPONINI  --  <0.03 <0.03 <0.03  --   --   --     Estimated Creatinine Clearance: 69.4 mL/min (A) (by C-G formula based on SCr of 1.22 mg/dL (H)).  Infusions:  . sodium chloride    . sodium chloride    . heparin 1,300 Units/hr (12/30/17 1430)    Assessment: [name] admitted with chest pain, on dabigatran PTA for history of Afib and DVT. Transitioned to IV heparin on 5/11 in anticipation for cath on 5/13. Pharmacy consulted to dose.  Heparin level this afternoon is therapeutic after a rate increase earlier today (HL 0.44 << 0.19, goal of 0.3-0.7). CBC stable from this AM - no bleeding noted.   Goal of Therapy:  Heparin level 0.3-0.7 units/ml Monitor platelets by anticoagulation protocol: Yes   Plan:  Continue Heparin at 1300 units/hr Will continue to monitor for any signs/symptoms of bleeding and will follow up with heparin level in 6 hours to confirm therapeutic  Thank you for allowing pharmacy to be a part of this patient's care.  Alycia Rossetti, PharmD, BCPS Clinical Pharmacist Pager: [phone] Clinical phone for 12/30/2017 from 7a-3:30p: [phone] If after 3:30p, please call main pharmacy at: [phone] 12/30/2017 4:06 PM

## 2017-12-30 NOTE — Progress Notes (Addendum)
Called Dr. Terrence Dupont srvc.  CBG HS 285 no coverage.   No answer, on hold in cue for 7 min  Called again no answer, just the music for waiting.

## 2017-12-30 NOTE — Progress Notes (Signed)
Subjective:  Continues to have vague chest pain off and on without associated symptoms. Denies any shortness of breath. Patient remains in atrial fibrillation with controlled ventricular response. Patient off pradaxa  now on IV heparin per patient for cardiac cath tomorrow  Objective:  Vital Signs in the last 24 hours: Temp:  [97.7 F (36.5 C)-98.3 F (36.8 C)] 98.3 F (36.8 C) (05/12 0515) Pulse Rate:  [55-100] 100 (05/12 0912) Resp:  [18-20] 18 (05/12 0515) BP: (103-141)/(58-86) 140/86 (05/12 0912) SpO2:  [95 %-100 %] 100 % (05/12 0515) Weight:  [134.5 kg (296 lb 9.6 oz)] 134.5 kg (296 lb 9.6 oz) (05/12 0515)  Intake/Output from previous day: 05/11 0701 - 05/12 0700 In: 100 [P.O.:100] Out: -  Intake/Output from this shift: Total I/O In: 238 [P.O.:238] Out: -   Physical Exam: Neck: no adenopathy, no carotid bruit, no JVD and supple, symmetrical, trachea midline Lungs: clear to auscultation bilaterally Heart: irregularly irregular rhythm, S1, S2 normal and Soft systolic murmur noted Abdomen: soft, non-tender; bowel sounds normal; no masses,  no organomegaly Extremities: extremities normal, atraumatic, no cyanosis or edema  Lab Results: Recent Labs    12/30/17 0436  WBC 9.6  HGB 13.7  PLT 221   Recent Labs    12/28/17 0853  NA 139  K 4.0  CL 100*  CO2 30  GLUCOSE 318*  BUN 8  CREATININE 1.26*   Recent Labs    12/29/17 1422 12/29/17 2002  TROPONINI <0.03 <0.03   Hepatic Function Panel No results for input(s): PROT, ALBUMIN, AST, ALT, ALKPHOS, BILITOT, BILIDIR, IBILI in the last 72 hours. Recent Labs    12/30/17 0436  CHOL 106   No results for input(s): PROTIME in the last 72 hours.  Imaging: Imaging results have been reviewed and Nm Myocar Multi W/spect W/wall Motion / Ef  Result Date: 12/28/2017 CLINICAL DATA:  62 year old with chest pain. EXAM: MYOCARDIAL IMAGING WITH SPECT (REST AND PHARMACOLOGIC-STRESS) GATED LEFT VENTRICULAR WALL MOTION STUDY  LEFT VENTRICULAR EJECTION FRACTION TECHNIQUE: Standard myocardial SPECT imaging was performed after resting intravenous injection of 10 mCi Tc-72m tetrofosmin. Subsequently, intravenous infusion of Lexiscan was performed under the supervision of the Cardiology staff. At peak effect of the drug, 30 mCi Tc-19m tetrofosmin was injected intravenously and standard myocardial SPECT imaging was performed. Quantitative gated imaging was also performed to evaluate left ventricular wall motion, and estimate left ventricular ejection fraction. COMPARISON:  04/10/2016 FINDINGS: Perfusion: There is concern for mild reversibility involving the apical inferior wall. No other areas are concerning for reversibility or infarct. Wall Motion: Mild hypokinesia in left ventricle most prominent along the inferior apex. Left Ventricular Ejection Fraction: 37 %, previously 54%. End diastolic volume 229 ml End systolic volume 72 ml IMPRESSION: 1. Mild reversibility involving the apical inferior wall. Findings could represent a small area of ischemia. 2. Mild hypokinesia in left ventricle, most prominent along the inferior apex. 3. Left ventricular ejection fraction is 37%, previously 54%. 4. Non invasive risk stratification*: Intermediate *2012 Appropriate Use Criteria for Coronary Revascularization Focused Update: J Am Coll Cardiol. 7989;21(1):941-740. http://content.[email or web address]?[email or web address] Electronically Signed   By: Markus Daft M.D.   On: 12/28/2017 14:37    Cardiac Studies:  Assessment/Plan:  Atypical chest pain with some features worrisome for angina MI ruled out Abnormal nuclear stress test Status post Abdominal pain rule out GERD and gastritis/peptic ulcer disease Compensated systolic/diastolic congestive heart failure. Chronic atrial fibrillation. Hypertension. Diabetes mellitus COPD Obstructive sleep apnea/obesity hypoventilation syndrome. Morbid obesity. Chronic kidney disease. History of  DVT. Depression. Degenerative joint disease. Plan Continue present management Discussed again with patient regarding abnormal nuclear stress test and various options of treatment I.e. Medical versus invasive left cath possible PTCA stenting its risk and benefitsand consents for PCI.   LOS: 1 day    Charolette Forward 12/30/2017, 9:57 AM

## 2017-12-30 NOTE — Progress Notes (Addendum)
Paged Dr. Silas [place], [facility].  CBG HS 285 no coverage.  Order for insulin on MAR now

## 2017-12-30 NOTE — Progress Notes (Signed)
Received a call from Chemistry lab about her BMP.  There was not enough blood to run, so BMP will be reordered.   Idolina Primer, RN

## 2017-12-30 NOTE — H&P (View-Only) (Signed)
Subjective:  Continues to have vague chest pain off and on without associated symptoms. Denies any shortness of breath. Patient remains in atrial fibrillation with controlled ventricular response. Patient off pradaxa  now on IV heparin per patient for cardiac cath tomorrow  Objective:  Vital Signs in the last 24 hours: Temp:  [97.7 F (36.5 C)-98.3 F (36.8 C)] 98.3 F (36.8 C) (05/12 0515) Pulse Rate:  [55-100] 100 (05/12 0912) Resp:  [18-20] 18 (05/12 0515) BP: (103-141)/(58-86) 140/86 (05/12 0912) SpO2:  [95 %-100 %] 100 % (05/12 0515) Weight:  [134.5 kg (296 lb 9.6 oz)] 134.5 kg (296 lb 9.6 oz) (05/12 0515)  Intake/Output from previous day: 05/11 0701 - 05/12 0700 In: 100 [P.O.:100] Out: -  Intake/Output from this shift: Total I/O In: 238 [P.O.:238] Out: -   Physical Exam: Neck: no adenopathy, no carotid bruit, no JVD and supple, symmetrical, trachea midline Lungs: clear to auscultation bilaterally Heart: irregularly irregular rhythm, S1, S2 normal and Soft systolic murmur noted Abdomen: soft, non-tender; bowel sounds normal; no masses,  no organomegaly Extremities: extremities normal, atraumatic, no cyanosis or edema  Lab Results: Recent Labs    12/30/17 0436  WBC 9.6  HGB 13.7  PLT 221   Recent Labs    12/28/17 0853  NA 139  K 4.0  CL 100*  CO2 30  GLUCOSE 318*  BUN 8  CREATININE 1.26*   Recent Labs    12/29/17 1422 12/29/17 2002  TROPONINI <0.03 <0.03   Hepatic Function Panel No results for input(s): PROT, ALBUMIN, AST, ALT, ALKPHOS, BILITOT, BILIDIR, IBILI in the last 72 hours. Recent Labs    12/30/17 0436  CHOL 106   No results for input(s): PROTIME in the last 72 hours.  Imaging: Imaging results have been reviewed and Nm Myocar Multi W/spect W/wall Motion / Ef  Result Date: 12/28/2017 CLINICAL DATA:  62 year old with chest pain. EXAM: MYOCARDIAL IMAGING WITH SPECT (REST AND PHARMACOLOGIC-STRESS) GATED LEFT VENTRICULAR WALL MOTION STUDY  LEFT VENTRICULAR EJECTION FRACTION TECHNIQUE: Standard myocardial SPECT imaging was performed after resting intravenous injection of 10 mCi Tc-73m tetrofosmin. Subsequently, intravenous infusion of Lexiscan was performed under the supervision of the Cardiology staff. At peak effect of the drug, 30 mCi Tc-58m tetrofosmin was injected intravenously and standard myocardial SPECT imaging was performed. Quantitative gated imaging was also performed to evaluate left ventricular wall motion, and estimate left ventricular ejection fraction. COMPARISON:  04/10/2016 FINDINGS: Perfusion: There is concern for mild reversibility involving the apical inferior wall. No other areas are concerning for reversibility or infarct. Wall Motion: Mild hypokinesia in left ventricle most prominent along the inferior apex. Left Ventricular Ejection Fraction: 37 %, previously 54%. End diastolic volume 916 ml End systolic volume 72 ml IMPRESSION: 1. Mild reversibility involving the apical inferior wall. Findings could represent a small area of ischemia. 2. Mild hypokinesia in left ventricle, most prominent along the inferior apex. 3. Left ventricular ejection fraction is 37%, previously 54%. 4. Non invasive risk stratification*: Intermediate *2012 Appropriate Use Criteria for Coronary Revascularization Focused Update: J Am Coll Cardiol. 3846;65(9):935-701. http://content.airportbarriers.com.aspx?articleid=[email or web address] Electronically Signed   By: Markus Daft M.D.   On: 12/28/2017 14:37    Cardiac Studies:  Assessment/Plan:  Atypical chest pain with some features worrisome for angina MI ruled out Abnormal nuclear stress test Status post Abdominal pain rule out GERD and gastritis/peptic ulcer disease Compensated systolic/diastolic congestive heart failure. Chronic atrial fibrillation. Hypertension. Diabetes mellitus COPD Obstructive sleep apnea/obesity hypoventilation syndrome. Morbid obesity. Chronic kidney disease. History of  DVT. Depression. Degenerative joint disease. Plan Continue present management Discussed again with patient regarding abnormal nuclear stress test and various options of treatment I.e. Medical versus invasive left cath possible PTCA stenting its risk and benefitsand consents for PCI.   LOS: 1 day    Charolette Forward 12/30/2017, 9:57 AM

## 2017-12-30 NOTE — Progress Notes (Signed)
Pt stated she does not want to wear CPAP for the night.

## 2017-12-30 NOTE — Progress Notes (Signed)
ANTICOAGULATION CONSULT NOTE   Pharmacy Consult for heparin Indication: chest pain/ACS  Patient Measurements: Height: 5\' 7"  (170.2 cm) Weight: 296 lb 9.6 oz (134.5 kg) IBW/kg (Calculated) : 61.6 Heparin Dosing Weight: 94 kg  Vital Signs: Temp: 97.4 F (36.3 C) (05/12 2135) Temp Source: Oral (05/12 2135) BP: 122/79 (05/12 2135) Pulse Rate: 83 (05/12 2135)  Labs: Recent Labs    12/28/17 0853 12/29/17 0847 12/29/17 1422 12/29/17 2002 12/30/17 0436 12/30/17 1225 12/30/17 1522 12/30/17 2144  HGB  --   --   --   --  13.7  --   --   --   HCT  --   --   --   --  41.3  --   --   --   PLT  --   --   --   --  221  --   --   --   HEPARINUNFRC  --   --   --   --  0.19*  --  0.44 0.43  CREATININE 1.26*  --   --   --   --  1.22*  --   --   TROPONINI  --  <0.03 <0.03 <0.03  --   --   --   --     Estimated Creatinine Clearance: 69.4 mL/min (A) (by C-G formula based on SCr of 1.22 mg/dL (H)).  Infusions:  . sodium chloride    . sodium chloride    . heparin 1,300 Units/hr (12/30/17 1430)    Assessment: 62 y.o. female with chest pain, h/o AFib and Pradaxa on hold, for heparin  Goal of Therapy:  Heparin level 0.3-0.7 units/ml Monitor platelets by anticoagulation protocol: Yes   Plan:  Continue Heparin at current rate   Phillis Knack, PharmD, BCPS  12/30/2017 11:30 PM

## 2017-12-30 NOTE — Plan of Care (Signed)
Pt had a discussion about cardiac cath with MD.

## 2017-12-30 NOTE — Progress Notes (Signed)
PROGRESS NOTE    [name]  ZCH:[id number] DOB: 22-Apr-1956 DOA: 12/26/2017 PCP: Charolette Forward, MD    Brief Narrative: [name] is a 62 y.o. female with history of atrial fibrillation, DVT, diabetes mellitus, CHF, hyperlipidemia, sleep apnea presents to the ER with complaints of chest pain ongoing for last 4 days.  Patient states the chest pain is retrosternal and epigastric with some nausea and burning sensation at times pressure-like.  Present even at rest.  Denies any associated shortness of breath fever chills productive cough.  Says the pain worsened today patient came to the ER.  Patient had a stress test in 2017 which was unremarkable.  ED Course: In the ER EKG was showing A. fib rate controlled troponin was negative since patient was complaining of epigastric pain CT abdomen and pelvis was done which was unremarkable.  LFTs were normal.  Patient continued to complain of chest pain.  Patient admitted for further observation.   Assessment & Plan:   Principal Problem:   Chest pain Active Problems:   Morbid obesity (HCC)   OSA (obstructive sleep apnea)   Chronic atrial fibrillation (HCC)   Diastolic dysfunction   Diabetes mellitus type 2 in obese (HCC)   1-Chest pain, epigastric pain. Abnormal stress test.  LFT normal. Lipase normal.  CT abdomen pelvis no acute abnormalities. Prior cholecystectomy.  Troponin negative.  Started  PPI BID./ Carafate Stress test first; intermediate risk, some area of possible reversible ischemia. Follow cardio recommendation.  D dimer negative.  ECHO. Normal EF.  IV heparin. Holding pradaxa.  Troponin negative.  On metoprolol and Crestor.  Cath tomorrow.  Still with chest pain on and off. Marland Kitchen   2-DM;  Continue with levemir. Increased  novolog to 15 units also getting SSI.   3-Chronic diastolic HF. Compensated. On spironolactone.  Lasix prn.   Left Ovarian lesion since 2011. Informed results to patient. She will need to  follow up with GYN for further evaluation.   History of sleep apnea on CPAP. Hyperlipidemia on statins. 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 History of atrial fibrillation presently on Coreg and Pradaxa.    DVT prophylaxis: pradaxa Code Status: full code Family Communication: care discussed with patient.  Disposition Plan: home when work up completed.     Consultants:   Cardiology    Procedures: stress test   echo  Antimicrobials: none  Subjective: Chest pain on and off.     Objective: Vitals:   12/29/17 2139 12/30/17 0029 12/30/17 0515 12/30/17 0912  BP: 128/72 125/74 116/72 140/86  Pulse: 94 76 (!) 55 100  Resp:  18 18   Temp:  98.1 F (36.7 C) 98.3 F (36.8 C)   TempSrc:      SpO2:   100%   Weight:   134.5 kg (296 lb 9.6 oz)   Height:        Intake/Output Summary (Last 24 hours) at 12/30/2017 0950 Last data filed at 12/30/2017 0944 Gross per 24 hour  Intake 338 ml  Output -  Net 338 ml   Filed Weights   12/28/17 2878 12/29/17 0538 12/30/17 0515  Weight: 134 kg (295 lb 8 oz) 134.4 kg (296 lb 6.4 oz) 134.5 kg (296 lb 9.6 oz)    Examination:  General exam: NAD Respiratory system' CTA Cardiovascular system: S 1, S 2 RRR Gastrointestinal system: BS present, soft, nt Central nervous system:  Non focal.  Extremities: Symmetric power.  Skin: no rashes.   Data Reviewed: I have personally reviewed following labs  and imaging studies  CBC: Recent Labs  Lab 12/26/17 1955 12/30/17 0436  WBC 10.5 9.6  HGB 14.4 13.7  HCT 42.7 41.3  MCV 84.1 84.6  PLT 265 546   Basic Metabolic Panel: Recent Labs  Lab 12/26/17 1955 12/27/17 0935 12/28/17 0853  NA 135 137 139  K 4.4 3.7 4.0  CL 97* 100* 100*  CO2 30 26 30   GLUCOSE 376* 277* 318*  BUN 6 7 8   CREATININE 1.35* 1.24* 1.26*  CALCIUM 9.7 8.9 9.0   GFR: Estimated Creatinine Clearance: 67.2 mL/min (A) (by C-G formula based on SCr of 1.26 mg/dL (H)). Liver Function Tests: Recent Labs  Lab  12/26/17 1955  AST 16  ALT 12*  ALKPHOS 85  BILITOT 0.6  PROT 7.8  ALBUMIN 3.4*   Recent Labs  Lab 12/26/17 1955  LIPASE 24   No results for input(s): AMMONIA in the last 168 hours. Coagulation Profile: No results for input(s): INR, PROTIME in the last 168 hours. Cardiac Enzymes: Recent Labs  Lab 12/27/17 0457 12/27/17 0935 12/29/17 0847 12/29/17 1422 12/29/17 2002  TROPONINI <0.03 <0.03 <0.03 <0.03 <0.03   BNP (last 3 results) No results for input(s): PROBNP in the last 8760 hours. HbA1C: No results for input(s): HGBA1C in the last 72 hours. CBG: Recent Labs  Lab 12/29/17 0745 12/29/17 1137 12/29/17 1646 12/29/17 2151 12/30/17 0758  GLUCAP 256* 314* 303* 311* 352*   Lipid Profile: Recent Labs    12/30/17 0436  CHOL 106  HDL 34*  LDLCALC 52  TRIG 102  CHOLHDL 3.1   Thyroid Function Tests: No results for input(s): TSH, T4TOTAL, FREET4, T3FREE, THYROIDAB in the last 72 hours. Anemia Panel: No results for input(s): VITAMINB12, FOLATE, FERRITIN, TIBC, IRON, RETICCTPCT in the last 72 hours. Sepsis Labs: No results for input(s): PROCALCITON, LATICACIDVEN in the last 168 hours.  No results found for this or any previous visit (from the past 240 hour(s)).       Radiology Studies: Nm Myocar Multi W/spect W/wall Motion / Ef  Result Date: 12/28/2017 CLINICAL DATA:  62 year old with chest pain. EXAM: MYOCARDIAL IMAGING WITH SPECT (REST AND PHARMACOLOGIC-STRESS) GATED LEFT VENTRICULAR WALL MOTION STUDY LEFT VENTRICULAR EJECTION FRACTION TECHNIQUE: Standard myocardial SPECT imaging was performed after resting intravenous injection of 10 mCi Tc-48m tetrofosmin. Subsequently, intravenous infusion of Lexiscan was performed under the supervision of the Cardiology staff. At peak effect of the drug, 30 mCi Tc-31m tetrofosmin was injected intravenously and standard myocardial SPECT imaging was performed. Quantitative gated imaging was also performed to evaluate left  ventricular wall motion, and estimate left ventricular ejection fraction. COMPARISON:  04/10/2016 FINDINGS: Perfusion: There is concern for mild reversibility involving the apical inferior wall. No other areas are concerning for reversibility or infarct. Wall Motion: Mild hypokinesia in left ventricle most prominent along the inferior apex. Left Ventricular Ejection Fraction: 37 %, previously 54%. End diastolic volume 270 ml End systolic volume 72 ml IMPRESSION: 1. Mild reversibility involving the apical inferior wall. Findings could represent a small area of ischemia. 2. Mild hypokinesia in left ventricle, most prominent along the inferior apex. 3. Left ventricular ejection fraction is 37%, previously 54%. 4. Non invasive risk stratification*: Intermediate *2012 Appropriate Use Criteria for Coronary Revascularization Focused Update: J Am Coll Cardiol. 3500;93(8):182-993. http://content.airportbarriers.com.aspx?articleid=[email or web address] Electronically Signed   By: Markus Daft M.D.   On: 12/28/2017 14:37        Scheduled Meds: . active partnership for health of your heart book   Does not apply Once  .  aspirin EC  81 mg Oral Daily  . clorazepate  7.5 mg Oral Daily  . cycloSPORINE  1 drop Both Eyes BID  . digoxin  0.25 mg Oral Daily  . DULoxetine  60 mg Oral Daily  . feeding supplement (ENSURE ENLIVE)  237 mL Oral BID BM  . gabapentin  300 mg Oral BID  . insulin aspart  0-15 Units Subcutaneous TID WC  . insulin aspart  12 Units Subcutaneous TID WC  . insulin detemir  35 Units Subcutaneous Daily  . isosorbide mononitrate  30 mg Oral Daily  . Living Better with Heart Failure Book   Does not apply Once  . losartan  50 mg Oral Daily  . metoprolol tartrate  25 mg Oral TID  . pantoprazole  40 mg Oral BID  . rosuvastatin  10 mg Oral Q breakfast  . spironolactone  25 mg Oral Daily  . sucralfate  1 g Oral TID WC & HS   Continuous Infusions: . heparin 1,300 Units/hr (12/30/17 0942)     LOS: 1 day     Time spent: 35 minutes.     Elmarie Shiley, MD [facility] Pager [phone]  If 7PM-7AM, please contact night-coverage [email or web address] Password [email or web address] 12/30/2017, 9:50 AM

## 2017-12-31 ENCOUNTER — Inpatient Hospital Stay (HOSPITAL_COMMUNITY)
Admission: EM | Disposition: A | Discharge status: Home / Self Care | Payer: Self-pay | Source: Home / Self Care | Attending: Internal Medicine

## 2017-12-31 HISTORY — PX: LEFT HEART CATH AND CORONARY ANGIOGRAPHY: CATH118249

## 2017-12-31 LAB — GLUCOSE, CAPILLARY
Glucose-Capillary: 281 mg/dL — ABNORMAL HIGH (ref 65–99)
Glucose-Capillary: 232 mg/dL — ABNORMAL HIGH (ref 65–99)
Glucose-Capillary: 181 mg/dL — ABNORMAL HIGH (ref 65–99)
Glucose-Capillary: 177 mg/dL — ABNORMAL HIGH (ref 65–99)
Glucose-Capillary: 333 mg/dL — ABNORMAL HIGH (ref 65–99)

## 2017-12-31 LAB — CBC
HCT: 37.5 % (ref 36.0–46.0)
Hemoglobin: 12 g/dL (ref 12.0–15.0)
MCH: 26.8 pg (ref 26.0–34.0)
MCHC: 32 g/dL (ref 30.0–36.0)
MCV: 83.9 fL (ref 78.0–100.0)
Platelets: 228 10*3/uL (ref 150–400)
RBC: 4.47 MIL/uL (ref 3.87–5.11)
RDW: 13.4 % (ref 11.5–15.5)
WBC: 9.1 10*3/uL (ref 4.0–10.5)

## 2017-12-31 LAB — HEPARIN LEVEL (UNFRACTIONATED): Heparin Unfractionated: 0.3 IU/mL (ref 0.30–0.70)

## 2017-12-31 LAB — POCT ACTIVATED CLOTTING TIME: Activated Clotting Time: 153 seconds

## 2017-12-31 SURGERY — LEFT HEART CATH AND CORONARY ANGIOGRAPHY
Anesthesia: LOCAL

## 2017-12-31 MED ORDER — MIDAZOLAM HCL 2 MG/2ML IJ SOLN
INTRAMUSCULAR | Status: AC
  Filled 2017-12-31: qty 2

## 2017-12-31 MED ORDER — FENTANYL CITRATE (PF) 100 MCG/2ML IJ SOLN
INTRAMUSCULAR | Status: AC
  Filled 2017-12-31: qty 2

## 2017-12-31 MED ORDER — SODIUM CHLORIDE 0.9 % IV SOLN: 250.0000 mL | INTRAVENOUS | Status: DC | PRN

## 2017-12-31 MED ORDER — SODIUM CHLORIDE 0.9 % WEIGHT BASED INFUSION: 1.0000 mL/kg/h | INTRAVENOUS | Status: AC

## 2017-12-31 MED ORDER — IOHEXOL 350 MG/ML SOLN
INTRAVENOUS | Status: DC | PRN
  Administered 2017-12-31: 55 mL

## 2017-12-31 MED ORDER — INSULIN ASPART 100 UNIT/ML ~~LOC~~ SOLN
0.0000 [IU] | Freq: Every day | SUBCUTANEOUS | Status: DC
  Administered 2017-12-31: 4 [IU] via SUBCUTANEOUS

## 2017-12-31 MED ORDER — LIDOCAINE HCL (PF) 1 % IJ SOLN
INTRAMUSCULAR | Status: AC
  Filled 2017-12-31: qty 30

## 2017-12-31 MED ORDER — HEPARIN (PORCINE) IN NACL 1000-0.9 UT/500ML-% IV SOLN
INTRAVENOUS | Status: AC
  Filled 2017-12-31: qty 1000

## 2017-12-31 MED ORDER — FENTANYL CITRATE (PF) 100 MCG/2ML IJ SOLN
INTRAMUSCULAR | Status: DC | PRN
  Administered 2017-12-31: 25 ug via INTRAVENOUS

## 2017-12-31 MED ORDER — LIDOCAINE HCL (PF) 1 % IJ SOLN
INTRAMUSCULAR | Status: DC | PRN
  Administered 2017-12-31: 15 mL

## 2017-12-31 MED ORDER — INSULIN ASPART 100 UNIT/ML ~~LOC~~ SOLN
0.0000 [IU] | Freq: Three times a day (TID) | SUBCUTANEOUS | Status: DC
  Administered 2018-01-01 (×2): 8 [IU] via SUBCUTANEOUS

## 2017-12-31 MED ORDER — SODIUM CHLORIDE 0.9% FLUSH: 3.0000 mL | INTRAVENOUS | Status: DC | PRN

## 2017-12-31 MED ORDER — HEPARIN (PORCINE) IN NACL 2-0.9 UNITS/ML
INTRAMUSCULAR | Status: AC | PRN
  Administered 2017-12-31 (×2): 500 mL

## 2017-12-31 MED ORDER — SODIUM CHLORIDE 0.9% FLUSH
3.0000 mL | Freq: Two times a day (BID) | INTRAVENOUS | Status: DC
  Administered 2018-01-01: 3 mL via INTRAVENOUS

## 2017-12-31 MED ORDER — MIDAZOLAM HCL 2 MG/2ML IJ SOLN
INTRAMUSCULAR | Status: DC | PRN
  Administered 2017-12-31: 1 mg via INTRAVENOUS

## 2017-12-31 SURGICAL SUPPLY — 8 items
CATH INFINITI 5FR MULTPACK ANG (CATHETERS) ×2 IMPLANT
HOVERMATT SINGLE USE (MISCELLANEOUS) ×2 IMPLANT
KIT HEART LEFT (KITS) ×2 IMPLANT
PACK CARDIAC CATHETERIZATION (CUSTOM PROCEDURE TRAY) ×2 IMPLANT
SHEATH PINNACLE 5F 10CM (SHEATH) ×2 IMPLANT
SYR MEDRAD MARK V 150ML (SYRINGE) ×2 IMPLANT
TRANSDUCER W/STOPCOCK (MISCELLANEOUS) ×2 IMPLANT
WIRE EMERALD 3MM-J .035X150CM (WIRE) ×2 IMPLANT

## 2017-12-31 NOTE — Progress Notes (Signed)
Inpatient Diabetes Program Recommendations  AACE/ADA: New Consensus Statement on Inpatient Glycemic Control (2015)  Target Ranges:  Prepandial:   less than 140 mg/dL      Peak postprandial:   less than 180 mg/dL (1-2 hours)      Critically ill patients:  140 - 180 mg/dL   Results for JAICE, LAGUE (MRN [id number]) as of 12/31/2017 09:57  Ref. Range 12/30/2017 07:58 12/30/2017 11:41 12/30/2017 16:38 12/30/2017 21:33 12/31/2017 08:05  Glucose-Capillary Latest Ref Range: 65 - 99 mg/dL 352 (H) 334 (H) 302 (H) 285 (H) 281 (H)   Review of Glycemic Control  Diabetes history: DM 2 Outpatient Diabetes medications: Levemir 35 units Daily, Novolog 25 units BID with meals, Januvia 50 mg Daily Current orders for Inpatient glycemic control: Levemir 35 units Daily, Novolog Moderate Correction 0-15 units tid + Novolog 15 units tid meal coverage  Inpatient Diabetes Program Recommendations:    Glucose consistently in the 200-300 range. Consider increasing Levemir to 45 units Daily.  Consider an updated A1c level to determine glucose control over the past 2-3 months.  Thanks,  Tama Headings RN, MSN, BC-ADM, [facility] Inpatient Diabetes Coordinator Team Pager [phone] (8a-5p)

## 2017-12-31 NOTE — Progress Notes (Signed)
Site area: Right groin a 5 french arterial sheath was removed  Site Prior to Removal:  Level 0  Pressure Applied For 20 MINUTES    Bedrest Beginning at 1515p  Manual:   Yes.    Patient Status During Pull:  stable  Post Pull Groin Site:  Level 0  Post Pull Instructions Given:  Yes.    Post Pull Pulses Present:  Yes.    Dressing Applied:  Yes.    Comments:  VS remain stable

## 2017-12-31 NOTE — Interval H&P Note (Signed)
Cath Lab Visit (complete for each Cath Lab visit)  Clinical Evaluation Leading to the Procedure:   ACS: No.  Non-ACS:    Anginal Classification: CCS III  Anti-ischemic medical therapy: Maximal Therapy (2 or more classes of medications)  Non-Invasive Test Results: Intermediate-risk stress test findings: cardiac mortality 1-3%/year  Prior CABG: No previous CABG      History and Physical Interval Note:  12/31/2017 1:56 PM  [name]  has presented today for surgery, with the diagnosis of unstable angina  The various methods of treatment have been discussed with the patient and family. After consideration of risks, benefits and other options for treatment, the patient has consented to  Procedure(s): LEFT HEART CATH AND CORONARY ANGIOGRAPHY (N/A) as a surgical intervention .  The patient's history has been reviewed, patient examined, no change in status, stable for surgery.  I have reviewed the patient's chart and labs.  Questions were answered to the patient's satisfaction.     Charolette Forward

## 2017-12-31 NOTE — Progress Notes (Signed)
Msg [facility] for coverage HS CBG 338.

## 2017-12-31 NOTE — Progress Notes (Signed)
Patient declined CPAP at this time, states she does not wish to wear it this evening because it makes her feel very congested. NO distress noted RCP will continue to monitor.

## 2017-12-31 NOTE — Progress Notes (Signed)
Patient was asking if the MD can order for her thyroid labs to be checked.

## 2017-12-31 NOTE — Progress Notes (Addendum)
6E 21 bleeding from rectum and vagina post heparin gtt.  Heparin stopped this am before R fe heart cath.  bleeding minimal.  pls advise.  MD ordered to monitor Hg earlier today 12.0

## 2017-12-31 NOTE — Progress Notes (Signed)
ANTICOAGULATION CONSULT NOTE   Pharmacy Consult for heparin Indication: chest pain/ACS  Patient Measurements: Height: 5\' 7"  (170.2 cm) Weight: (!) 309 lb 1.4 oz (140.2 kg) IBW/kg (Calculated) : 61.6 Heparin Dosing Weight: 94 kg  Vital Signs: Temp: 97.5 F (36.4 C) (05/13 0808) Temp Source: Oral (05/13 0808) BP: 146/94 (05/13 0808) Pulse Rate: 85 (05/13 0808)  Labs: Recent Labs    12/29/17 0847 12/29/17 1422 12/29/17 2002  12/30/17 0436 12/30/17 1225 12/30/17 1522 12/30/17 2144 12/31/17 0438  HGB  --   --   --   --  13.7  --   --   --  12.0  HCT  --   --   --   --  41.3  --   --   --  37.5  PLT  --   --   --   --  221  --   --   --  228  HEPARINUNFRC  --   --   --    < > 0.19*  --  0.44 0.43 0.30  CREATININE  --   --   --   --   --  1.22*  --   --   --   TROPONINI <0.03 <0.03 <0.03  --   --   --   --   --   --    < > = values in this interval not displayed.    Estimated Creatinine Clearance: 71.1 mL/min (A) (by C-G formula based on SCr of 1.22 mg/dL (H)).  Infusions:  . sodium chloride    . sodium chloride 1 mL/kg/hr (12/31/17 0714)  . heparin 1,300 Units/hr (12/30/17 1430)    Assessment: 62 y.o. female with chest pain, h/o AFib and Pradaxa on hold, for heparin  Goal of Therapy:  Heparin level 0.3-0.7 units/ml Monitor platelets by anticoagulation protocol: Yes   Plan:  Continue Heparin at current rate   Corinda Gubler, PharmD, [facility] 12/31/17

## 2017-12-31 NOTE — Progress Notes (Signed)
PROGRESS NOTE    [name]  OBS:[id number] DOB: 1955-11-16 DOA: 12/26/2017 PCP: Charolette Forward, MD    Brief Narrative: [name] is a 62 y.o. female with history of atrial fibrillation, DVT, diabetes mellitus, CHF, hyperlipidemia, sleep apnea presents to the ER with complaints of chest pain ongoing for last 4 days.  Patient states the chest pain is retrosternal and epigastric with some nausea and burning sensation at times pressure-like.  Present even at rest.  Denies any associated shortness of breath fever chills productive cough.  Says the pain worsened today patient came to the ER.  Patient had a stress test in 2017 which was unremarkable.  ED Course: In the ER EKG was showing A. fib rate controlled troponin was negative since patient was complaining of epigastric pain CT abdomen and pelvis was done which was unremarkable.  LFTs were normal.  Patient continued to complain of chest pain.  Patient admitted for further observation.   Assessment & Plan:   Principal Problem:   Chest pain Active Problems:   Morbid obesity (HCC)   OSA (obstructive sleep apnea)   Chronic atrial fibrillation (HCC)   Diastolic dysfunction   Diabetes mellitus type 2 in obese (HCC)   1-Chest pain, epigastric pain. Abnormal stress test.  LFT normal. Lipase normal.  CT abdomen pelvis no acute abnormalities. Prior cholecystectomy.  Troponin negative.  Started  PPI BID./ Carafate Stress test first; intermediate risk, some area of possible reversible ischemia. Follow cardio recommendation.  D dimer negative.  ECHO. Normal EF.  IV heparin. Holding pradaxa.  Troponin negative.  On metoprolol and Crestor.  Having chest pain and  headache this am.  For cath today.   2-DM;  Continue with levemir. Increased  novolog to 15 units also getting SSI.   3-Chronic diastolic HF. Compensated. On spironolactone.  Lasix prn.   Left Ovarian lesion since 2011. Informed results to patient. She will  need to follow up with GYN for further evaluation.   History of sleep apnea on CPAP. Hyperlipidemia on statins. 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 History of atrial fibrillation presently on Coreg and Pradaxa.    DVT prophylaxis: pradaxa Code Status: full code Family Communication: care discussed with patient.  Disposition Plan: home when work up completed.     Consultants:   Cardiology    Procedures: stress test   echo  Antimicrobials: none  Subjective: Report chest pain and headaches. Waiting for cath     Objective: Vitals:   12/31/17 1414 12/31/17 1419 12/31/17 1440 12/31/17 1445  BP: 114/70 (!) 141/67 110/71 116/65  Pulse: 73 77 79 71  Resp: 19 19 19 19   Temp:      TempSrc:      SpO2: 100% 99% 98% 98%  Weight:      Height:        Intake/Output Summary (Last 24 hours) at 12/31/2017 1509 Last data filed at 12/31/2017 1323 Gross per 24 hour  Intake 525.5 ml  Output -  Net 525.5 ml   Filed Weights   12/29/17 0538 12/30/17 0515 12/31/17 0310  Weight: 134.4 kg (296 lb 6.4 oz) 134.5 kg (296 lb 9.6 oz) (!) 140.2 kg (309 lb 1.4 oz)    Examination:  General exam: NAD Respiratory system; CTA Cardiovascular system: S 1, S 2 RRR Gastrointestinal system: BS present, soft, nt Central nervous system: non focal.  Extremities: symmetric power.  Skin: No Rash   Data Reviewed: I have personally reviewed following labs and imaging studies  CBC: Recent Labs  Lab 12/26/17 1955 12/30/17 0436 12/31/17 0438  WBC 10.5 9.6 9.1  HGB 14.4 13.7 12.0  HCT 42.7 41.3 37.5  MCV 84.1 84.6 83.9  PLT 265 221 161   Basic Metabolic Panel: Recent Labs  Lab 12/26/17 1955 12/27/17 0935 12/28/17 0853 12/30/17 1225  NA 135 137 139 137  K 4.4 3.7 4.0 4.3  CL 97* 100* 100* 99*  CO2 30 26 30 31   GLUCOSE 376* 277* 318* 379*  BUN 6 7 8 11   CREATININE 1.35* 1.24* 1.26* 1.22*  CALCIUM 9.7 8.9 9.0 9.0   GFR: Estimated Creatinine Clearance: 71.1 mL/min (A) (by C-G formula based  on SCr of 1.22 mg/dL (H)). Liver Function Tests: Recent Labs  Lab 12/26/17 1955  AST 16  ALT 12*  ALKPHOS 85  BILITOT 0.6  PROT 7.8  ALBUMIN 3.4*   Recent Labs  Lab 12/26/17 1955  LIPASE 24   No results for input(s): AMMONIA in the last 168 hours. Coagulation Profile: No results for input(s): INR, PROTIME in the last 168 hours. Cardiac Enzymes: Recent Labs  Lab 12/27/17 0457 12/27/17 0935 12/29/17 0847 12/29/17 1422 12/29/17 2002  TROPONINI <0.03 <0.03 <0.03 <0.03 <0.03   BNP (last 3 results) No results for input(s): PROBNP in the last 8760 hours. HbA1C: No results for input(s): HGBA1C in the last 72 hours. CBG: Recent Labs  Lab 12/30/17 1638 12/30/17 2133 12/31/17 0805 12/31/17 1200 12/31/17 1451  GLUCAP 302* 285* 281* 232* 181*   Lipid Profile: Recent Labs    12/30/17 0436  CHOL 106  HDL 34*  LDLCALC 52  TRIG 102  CHOLHDL 3.1   Thyroid Function Tests: No results for input(s): TSH, T4TOTAL, FREET4, T3FREE, THYROIDAB in the last 72 hours. Anemia Panel: No results for input(s): VITAMINB12, FOLATE, FERRITIN, TIBC, IRON, RETICCTPCT in the last 72 hours. Sepsis Labs: No results for input(s): PROCALCITON, LATICACIDVEN in the last 168 hours.  No results found for this or any previous visit (from the past 240 hour(s)).       Radiology Studies: No results found.      Scheduled Meds: . [MAR Hold] active partnership for health of your heart book   Does not apply Once  . [MAR Hold] aspirin EC  81 mg Oral Daily  . [MAR Hold] clorazepate  7.5 mg Oral Daily  . [MAR Hold] cycloSPORINE  1 drop Both Eyes BID  . [MAR Hold] digoxin  0.25 mg Oral Daily  . [MAR Hold] DULoxetine  60 mg Oral Daily  . [MAR Hold] feeding supplement (ENSURE ENLIVE)  237 mL Oral BID BM  . [MAR Hold] gabapentin  300 mg Oral BID  . [MAR Hold] insulin aspart  0-15 Units Subcutaneous TID WC  . [MAR Hold] insulin aspart  15 Units Subcutaneous TID WC  . [MAR Hold] insulin detemir   35 Units Subcutaneous Daily  . [MAR Hold] isosorbide mononitrate  30 mg Oral Daily  . [MAR Hold] Living Better with Heart Failure Book   Does not apply Once  . [MAR Hold] losartan  50 mg Oral Daily  . [MAR Hold] metoprolol tartrate  25 mg Oral TID  . [MAR Hold] pantoprazole  40 mg Oral BID  . [MAR Hold] rosuvastatin  10 mg Oral Q breakfast  . sodium chloride flush  3 mL Intravenous Q12H  . [MAR Hold] spironolactone  25 mg Oral Daily  . [MAR Hold] sucralfate  1 g Oral TID WC & HS   Continuous Infusions: . sodium chloride    .  sodium chloride 250 mL (12/31/17 1406)  . sodium chloride 1 mL/kg/hr (12/31/17 1459)  . heparin 1,300 Units/hr (12/31/17 0926)     LOS: 2 days    Time spent: 35 minutes.     Elmarie Shiley, MD [facility] Pager [phone]  If 7PM-7AM, please contact night-coverage [email or web address] Password [email or web address] 12/31/2017, 3:09 PM

## 2018-01-01 ENCOUNTER — Encounter (HOSPITAL_COMMUNITY): Payer: Self-pay | Admitting: Cardiology

## 2018-01-01 LAB — CBC
HCT: 40.3 % (ref 36.0–46.0)
Hemoglobin: 13.3 g/dL (ref 12.0–15.0)
MCH: 27.8 pg (ref 26.0–34.0)
MCHC: 33 g/dL (ref 30.0–36.0)
MCV: 84.3 fL (ref 78.0–100.0)
Platelets: 237 10*3/uL (ref 150–400)
RBC: 4.78 MIL/uL (ref 3.87–5.11)
RDW: 14.1 % (ref 11.5–15.5)
WBC: 7.7 10*3/uL (ref 4.0–10.5)

## 2018-01-01 LAB — BASIC METABOLIC PANEL
Anion gap: 11 (ref 5–15)
BUN: 10 mg/dL (ref 6–20)
CO2: 27 mmol/L (ref 22–32)
Calcium: 9.3 mg/dL (ref 8.9–10.3)
Chloride: 100 mmol/L — ABNORMAL LOW (ref 101–111)
Creatinine, Ser: 1.17 mg/dL — ABNORMAL HIGH (ref 0.44–1.00)
GFR calc Af Amer: 57 mL/min — ABNORMAL LOW (ref >60)
GFR calc non Af Amer: 49 mL/min — ABNORMAL LOW (ref >60)
Glucose, Bld: 251 mg/dL — ABNORMAL HIGH (ref 65–99)
Potassium: 4.3 mmol/L (ref 3.5–5.1)
Sodium: 138 mmol/L (ref 135–145)

## 2018-01-01 LAB — GLUCOSE, CAPILLARY
Glucose-Capillary: 267 mg/dL — ABNORMAL HIGH (ref 65–99)
Glucose-Capillary: 272 mg/dL — ABNORMAL HIGH (ref 65–99)

## 2018-01-01 MED ORDER — DABIGATRAN ETEXILATE MESYLATE 150 MG PO CAPS
150.0000 mg | ORAL_CAPSULE | Freq: Two times a day (BID) | ORAL | Status: DC
  Filled 2018-01-01: qty 1

## 2018-01-01 MED ORDER — TRAMADOL HCL 50 MG PO TABS: 50.0000 mg | ORAL_TABLET | Freq: Three times a day (TID) | ORAL | 0 refills | Status: DC | PRN

## 2018-01-01 MED ORDER — DIGOXIN 250 MCG PO TABS: 0.2500 mg | ORAL_TABLET | Freq: Every day | ORAL | 0 refills | Status: DC

## 2018-01-01 MED ORDER — METOPROLOL TARTRATE 25 MG PO TABS: 25.0000 mg | ORAL_TABLET | Freq: Three times a day (TID) | ORAL | 0 refills | Status: DC

## 2018-01-01 MED ORDER — PANTOPRAZOLE SODIUM 40 MG PO TBEC: 40.0000 mg | DELAYED_RELEASE_TABLET | Freq: Two times a day (BID) | ORAL | 0 refills | Status: DC

## 2018-01-01 MED ORDER — TRAMADOL HCL 50 MG PO TABS: 50.0000 mg | ORAL_TABLET | Freq: Three times a day (TID) | ORAL | Status: DC | PRN

## 2018-01-01 MED ORDER — SUCRALFATE 1 GM/10ML PO SUSP: 1.0000 g | Freq: Three times a day (TID) | ORAL | 0 refills | Status: DC

## 2018-01-01 NOTE — Discharge Summary (Addendum)
Physician Discharge Summary  [name] LAG:[id number] DOB: 12-11-1955 DOA: 12/26/2017  PCP: Charolette Forward, MD  Admit date: 12/26/2017 Discharge date: 01/01/2018  Admitted From: Home  Disposition:  Home   Recommendations for Outpatient Follow-up:  1. Follow up with PCP in 1-2 weeks 2. Please obtain BMP/CBC in one week 3. Might need endoscopy for further evaluation of chest pain.  4. Needs follow up with GYN for left ovarian lesion.     Discharge Condition; stable.  CODE STATUS: full code.  Diet recommendation: Carb Modified  Brief/Interim Summary:  Brief Narrative: [name] a 62 y.o.femalewithhistory of atrial fibrillation, DVT, diabetes mellitus, CHF, hyperlipidemia, sleep apnea presents to the ER with complaints of chest pain ongoing for last 4 days. Patient states the chest pain is retrosternal and epigastric with some nausea and burning sensation at times pressure-like. Present even at rest. Denies any associated shortness of breath fever chills productive cough. Says the pain worsened today patient came to the ER. Patient had a stress test in 2017 which was unremarkable.  ED Course:In the ER EKG was showing A. fib rate controlled troponin was negative since patient was complaining of epigastric pain CT abdomen and pelvis was done which was unremarkable. LFTs were normal. Patient continued to complain of chest pain. Patient admitted for further observation.   Assessment & Plan:   Principal Problem:   Chest pain Active Problems:   Morbid obesity (HCC)   OSA (obstructive sleep apnea)   Chronic atrial fibrillation (HCC)   Diastolic dysfunction   Diabetes mellitus type 2 in obese (HCC)   1-Chest pain, epigastric pain. Abnormal stress test.  LFT normal. Lipase normal.  CT abdomen pelvis no acute abnormalities. Prior cholecystectomy.  Troponin negative.  Started  PPI BID./ Carafate Stress test first; intermediate risk, some area of  possible reversible ischemia. Follow cardio recommendation.  D dimer negative.  ECHO. Normal EF.  IV heparin. Holding pradaxa.  Troponin negative.  On metoprolol and Crestor.  Cath with non obstructive Coronary artery.  Discussed with DR Benson Norway plan for one week trial of PPI and Carafate. And follow up out patient for further evaluation.  Still with chest pain on and off, Carafate helping some. Also pain on palpation.  Chest pain could be MSK pain or esophagitis.   2-DM;  Resume home regimen. She will continue to work on diet   3-Chronic diastolic HF. Compensated. On spironolactone.  Lasix prn.   Left Ovarian lesion since 2011. Informed results to patient. She will need to follow up with GYN for further evaluation.   CKD stage III; stable.  History of sleep apnea on CPAP. Obesity hypoventilation syndrome.  Hyperlipidemia on statins. 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 History of atrial fibrillation presently on Coreg and Pradaxa.      Discharge Diagnoses:  Principal Problem:   Chest pain Active Problems:   Morbid obesity (HCC)   OSA (obstructive sleep apnea)   Chronic atrial fibrillation (HCC)   Diastolic dysfunction   Diabetes mellitus type 2 in obese [facility])    Discharge Instructions  Discharge Instructions    Diet - low sodium heart healthy   Complete by:  As directed    Increase activity slowly   Complete by:  As directed      Allergies as of 01/01/2018      Reactions   Nsaids Other (See Comments)   On warfarin   Sulfa Antibiotics Itching      Medication List    STOP taking these medications  carvedilol 12.5 MG tablet Commonly known as:  COREG   DEXILANT 60 MG capsule Generic drug:  dexlansoprazole   diphenhydrAMINE 25 MG tablet Commonly known as:  BENADRYL   hydrOXYzine 50 MG capsule Commonly known as:  VISTARIL   omeprazole 40 MG capsule Commonly known as:  PRILOSEC Replaced by:  pantoprazole 40 MG tablet   potassium chloride SA 20 MEQ  tablet Commonly known as:  K-DUR,KLOR-CON     TAKE these medications   clorazepate 7.5 MG tablet Commonly known as:  TRANXENE Take 7.5 mg by mouth daily.   dabigatran 150 MG Caps capsule Commonly known as:  PRADAXA Take 1 capsule (150 mg total) by mouth every 12 (twelve) hours.   digoxin 0.25 MG tablet Commonly known as:  LANOXIN Take 1 tablet (0.25 mg total) by mouth daily. Start taking on:  01/02/2018   DULoxetine 60 MG capsule Commonly known as:  CYMBALTA Take 60 mg by mouth daily.   furosemide 40 MG tablet Commonly known as:  LASIX Take 1 tablet (40 mg total) by mouth daily as needed for fluid or edema.   gabapentin 300 MG capsule Commonly known as:  NEURONTIN Take 300 mg by mouth 2 (two) times daily.   insulin aspart 100 UNIT/ML injection Commonly known as:  novoLOG Inject 25 Units into the skin 2 (two) times daily.   insulin detemir 100 UNIT/ML injection Commonly known as:  LEVEMIR Inject 35 Units into the skin daily.   losartan 50 MG tablet Commonly known as:  COZAAR Take 50 mg by mouth daily.   metoprolol tartrate 25 MG tablet Commonly known as:  LOPRESSOR Take 1 tablet (25 mg total) by mouth 3 (three) times daily.   MULTIVITAMIN WOMEN PO Take 1 tablet by mouth daily.   nitroGLYCERIN 0.4 MG SL tablet Commonly known as:  NITROSTAT Place 1 tablet (0.4 mg total) under the tongue every 5 (five) minutes x 3 doses as needed for chest pain.   pantoprazole 40 MG tablet Commonly known as:  PROTONIX Take 1 tablet (40 mg total) by mouth 2 (two) times daily. Replaces:  omeprazole 40 MG capsule   PROAIR HFA 108 (90 Base) MCG/ACT inhaler Generic drug:  albuterol Inhale 2 puffs into the lungs every 4 (four) hours as needed for wheezing (wheezing).   RESTASIS 0.05 % ophthalmic emulsion Generic drug:  cycloSPORINE Place 1 drop into both eyes 2 (two) times daily.   rosuvastatin 10 MG tablet Commonly known as:  CRESTOR Take 10 mg by mouth daily with  breakfast.   sitaGLIPtin 50 MG tablet Commonly known as:  JANUVIA Take 50 mg by mouth daily with breakfast.   spironolactone 25 MG tablet Commonly known as:  ALDACTONE Take 25 mg by mouth daily.   sucralfate 1 GM/10ML suspension Commonly known as:  CARAFATE Take 10 mLs (1 g total) by mouth 4 (four) times daily -  with meals and at bedtime.   traMADol 50 MG tablet Commonly known as:  ULTRAM Take 1 tablet (50 mg total) by mouth every 8 (eight) hours as needed for moderate pain.      Follow-up Information    Carol Ada, MD Follow up in 1 week(s).   Specialty:  Gastroenterology Contact information: [place], [place] [address] [phone]          Allergies  Allergen Reactions  . Nsaids Other (See Comments)    On warfarin  . Sulfa Antibiotics Itching    Consultations:  Cardiology    Procedures/Studies: Dg  Chest 2 View  Result Date: 12/26/2017 CLINICAL DATA:  62 year old with chest pain and shortness of breath. EXAM: CHEST - 2 VIEW COMPARISON:  01/28/2017 FINDINGS: Stable enlargement of the cardiac silhouette. The lungs are clear without airspace disease or pulmonary edema. Chronic elevation of the right hemidiaphragm. No large pleural effusions. No acute bone abnormality. IMPRESSION: Stable cardiomegaly. No acute findings. Electronically Signed   By: Markus Daft M.D.   On: 12/26/2017 20:58   Ct Abdomen Pelvis W Contrast  Result Date: 12/26/2017 CLINICAL DATA:  62 year old female with left lower quadrant abdominal pain and diarrhea. EXAM: CT ABDOMEN AND PELVIS WITH CONTRAST TECHNIQUE: Multidetector CT imaging of the abdomen and pelvis was performed using the standard protocol following bolus administration of intravenous contrast. CONTRAST:  145mL OMNIPAQUE IOHEXOL 300 MG/ML  SOLN COMPARISON:  Right upper quadrant ultrasound dated 04/11/2016, CT of abdomen pelvis dated 01/04/2010, and MRI dated 08/17/2012 FINDINGS: Lower chest: The visualized lung  bases are clear. There is mild cardiomegaly. No intra-abdominal free air or free fluid Hepatobiliary: Cholecystectomy. The liver is unremarkable. No intrahepatic biliary ductal dilatation. Pancreas: Unremarkable. No pancreatic ductal dilatation or surrounding inflammatory changes. Spleen: Normal in size without focal abnormality. Adrenals/Urinary Tract: The adrenal glands are unremarkable. Subcentimeter right renal hypodensities are too small to characterize. There is no hydronephrosis on either side. There is symmetric enhancement and excretion of contrast by both kidneys. The visualized ureters and urinary bladder appear unremarkable. Stomach/Bowel: There is no bowel obstruction or active inflammation. Normal appendix. Vascular/Lymphatic: The abdominal aorta and IVC are unremarkable. No portal venous gas. 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 There is no adenopathy. Reproductive: Hysterectomy. There is a 3.5 x 3.0 cm hypodense lesion the left ovary which is similar to prior CT of 2011, likely a benign or indolent process such as a serous lesion. Other: None Musculoskeletal: Mild degenerative changes of the spine. No acute osseous pathology. IMPRESSION: 1. No acute intra-abdominal or pelvic pathology. No bowel obstruction or active inflammation. Normal appendix. 2. Stable appearance of the left ovarian lesion compared to the CT of 2011. This lesion is not characterized on this CT but likely represents a benign or indolent process such as a serous epithelial lesion given interval stability. 3. Left renal artery aneurysms, unchanged. 4. Mild cardiomegaly. Electronically Signed   By: Anner Crete M.D.   On: 12/26/2017 21:52   Nm Myocar Multi W/spect W/wall Motion / Ef  Result Date: 12/28/2017 CLINICAL DATA:  62 year old with chest pain. EXAM: MYOCARDIAL IMAGING WITH SPECT (REST AND  PHARMACOLOGIC-STRESS) GATED LEFT VENTRICULAR WALL MOTION STUDY LEFT VENTRICULAR EJECTION FRACTION TECHNIQUE: Standard myocardial SPECT imaging was performed after resting intravenous injection of 10 mCi Tc-16m tetrofosmin. Subsequently, intravenous infusion of Lexiscan was performed under the supervision of the Cardiology staff. At peak effect of the drug, 30 mCi Tc-60m tetrofosmin was injected intravenously and standard myocardial SPECT imaging was performed. Quantitative gated imaging was also performed to evaluate left ventricular wall motion, and estimate left ventricular ejection fraction. COMPARISON:  04/10/2016 FINDINGS: Perfusion: There is concern for mild reversibility involving the apical inferior wall. No other areas are concerning for reversibility or infarct. Wall Motion: Mild hypokinesia in left ventricle most prominent along the inferior apex. Left Ventricular Ejection Fraction: 37 %, previously 54%. End diastolic volume 119 ml End systolic volume 72 ml IMPRESSION: 1. Mild reversibility involving the apical inferior  wall. Findings could represent a small area of ischemia. 2. Mild hypokinesia in left ventricle, most prominent along the inferior apex. 3. Left ventricular ejection fraction is 37%, previously 54%. 4. Non invasive risk stratification*: Intermediate *2012 Appropriate Use Criteria for Coronary Revascularization Focused Update: J Am Coll Cardiol. 2353;61(4):431-540. http://content.[email or web address].aspx?articleid=[email or web address] Electronically Signed   By: Markus Daft M.D.   On: 12/28/2017 14:37     Subjective: Chest pain on and off, reproduce on palpation, and some times associated with meals.   Discharge Exam: Vitals:   01/01/18 0847 01/01/18 1121  BP: (!) 133/96 134/79  Pulse: 98 98  Resp:    Temp: 98.2 F (36.8 C) 98.1 F (36.7 C)  SpO2: 96% 100%   Vitals:   01/01/18 0439 01/01/18 0443 01/01/18 0847 01/01/18 1121  BP:  (!) 130/97 (!) 133/96 134/79  Pulse:  96 98 98   Resp:  18    Temp:  97.9 F (36.6 C) 98.2 F (36.8 C) 98.1 F (36.7 C)  TempSrc:  Oral Oral Oral  SpO2:  90% 96% 100%  Weight: (!) 136.4 kg (300 lb 9.6 oz)     Height:        General: Pt is alert, awake, not in acute distress Cardiovascular: RRR, S1/S2 +, no rubs, no gallops Respiratory: CTA bilaterally, no wheezing, no rhonchi Abdominal: Soft, NT, ND, bowel sounds + Extremities: no edema, no cyanosis    The results of significant diagnostics from this hospitalization (including imaging, microbiology, ancillary and laboratory) are listed below for reference.     Microbiology: No results found for this or any previous visit (from the past 240 hour(s)).   Labs: BNP (last 3 results) No results for input(s): BNP in the last 8760 hours. Basic Metabolic Panel: Recent Labs  Lab 12/26/17 1955 12/27/17 0935 12/28/17 0853 12/30/17 1225 01/01/18 0448  NA 135 137 139 137 138  K 4.4 3.7 4.0 4.3 4.3  CL 97* 100* 100* 99* 100*  CO2 30 26 30 31 27   GLUCOSE 376* 277* 318* 379* 251*  BUN 6 7 8 11 10   CREATININE 1.35* 1.24* 1.26* 1.22* 1.17*  CALCIUM 9.7 8.9 9.0 9.0 9.3   Liver Function Tests: Recent Labs  Lab 12/26/17 1955  AST 16  ALT 12*  ALKPHOS 85  BILITOT 0.6  PROT 7.8  ALBUMIN 3.4*   Recent Labs  Lab 12/26/17 1955  LIPASE 24   No results for input(s): AMMONIA in the last 168 hours. CBC: Recent Labs  Lab 12/26/17 1955 12/30/17 0436 12/31/17 0438 01/01/18 0446  WBC 10.5 9.6 9.1 7.7  HGB 14.4 13.7 12.0 13.3  HCT 42.7 41.3 37.5 40.3  MCV 84.1 84.6 83.9 84.3  PLT 265 221 228 237   Cardiac Enzymes: Recent Labs  Lab 12/27/17 0457 12/27/17 0935 12/29/17 0847 12/29/17 1422 12/29/17 2002  TROPONINI <0.03 <0.03 <0.03 <0.03 <0.03   BNP: Invalid input(s): POCBNP CBG: Recent Labs  Lab 12/31/17 1451 12/31/17 1643 12/31/17 2107 01/01/18 0812 01/01/18 1126  GLUCAP 181* 177* 333* 267* 272*   D-Dimer No results for input(s): DDIMER in the last 72  hours. Hgb A1c No results for input(s): HGBA1C in the last 72 hours. Lipid Profile Recent Labs    12/30/17 0436  CHOL 106  HDL 34*  LDLCALC 52  TRIG 102  CHOLHDL 3.1   Thyroid function studies No results for input(s): TSH, T4TOTAL, T3FREE, THYROIDAB in the last 72 hours.  Invalid input(s): FREET3 Anemia work up No results for input(s): VITAMINB12, FOLATE,  FERRITIN, TIBC, IRON, RETICCTPCT in the last 72 hours. Urinalysis    Component Value Date/Time   COLORURINE YELLOW 12/26/2017 1958   APPEARANCEUR CLEAR 12/26/2017 1958   LABSPEC 1.034 (H) 12/26/2017 1958   PHURINE 5.0 12/26/2017 1958   GLUCOSEU 50 (A) 12/26/2017 1958   HGBUR NEGATIVE 12/26/2017 [place] NEGATIVE 12/26/2017 1958   KETONESUR NEGATIVE 12/26/2017 1958   PROTEINUR NEGATIVE 12/26/2017 1958   UROBILINOGEN 1.0 10/26/2014 0249   NITRITE NEGATIVE 12/26/2017 1958   LEUKOCYTESUR NEGATIVE 12/26/2017 1958   Sepsis Labs Invalid input(s): PROCALCITONIN,  WBC,  LACTICIDVEN Microbiology No results found for this or any previous visit (from the past 240 hour(s)).   Time coordinating discharge: 35 minutes,  SIGNED:   Elmarie Shiley, MD  [facility] 01/01/2018, 1:40 PM Pager [phone]  If 7PM-7AM, please contact night-coverage [email or web address] Password [email or web address]

## 2018-01-01 NOTE — Consult Note (Signed)
            [facility] CM Primary Care Navigator  01/01/2018  Zissy Hamlett [birth year] [id number]   Went to see patient at the bedside to identify possible discharge needs but she was already discharged home today per staff report.   Per MD note, patient was seen forevaluation of worsening chest pain.   Called Dr. Jennye Moccasin office at [facility] (PCP listed on BI Report) but was informed by staff Marye Round) that patient has not been seen in this practice.  Call placed to Dr. Pricilla Riffle office at [facility] (PCP listed in [place]) and confirmed by staff Almyra Free) that patient is being seen in this practice and Dr. Terrence Dupont is her primary care provider. Officewas notified of patient's discharge, need for post hospital follow-up and transition of care and was told that they will contact patient for post hospital follow-up.  Notified of patient's health issues needing follow-up (mainly DM with last  A1c listed as 11.2 in 2017). Called to [facility] Government social research officer ([employer] for further verification of primary care provider (PCP) status, awaiting further instructions.  Patient was seen and followed by Dr. Terrence Dupont during this hospitalization.   Patient has discharge instruction to follow-up withprimary care provider in 1- 2 weeks and gastroenterology in 1 week (may need endoscopy for further evaluation of chest pain).   For additional questions please contact:  Edwena Felty A. Noah Pelaez, BSN, RN-BC [facility] PRIMARY CARE Navigator Cell: [phone]

## 2018-01-01 NOTE — Progress Notes (Signed)
Wasted morphine 1mg  IV with [place]. [name] Kinds RN

## 2018-01-01 NOTE — Progress Notes (Signed)
Subjective:  Denies any anginal chest pain tolerated left cardiac catheterization yesterday noted to have nonobstructive CAD . Her groin is stable remains in atrial fibrillation with controlled ventricular response  Objective:  Vital Signs in the last 24 hours: Temp:  [97.6 F (36.4 C)-98.4 F (36.9 C)] 98.2 F (36.8 C) (05/14 0847) Pulse Rate:  [65-120] 98 (05/14 0847) Resp:  [8-30] 18 (05/14 0443) BP: (94-141)/(55-97) 133/96 (05/14 0847) SpO2:  [90 %-100 %] 96 % (05/14 0847) Weight:  [136.4 kg (300 lb 9.6 oz)] 136.4 kg (300 lb 9.6 oz) (05/14 0439)  Intake/Output from previous day: 05/13 0701 - 05/14 0700 In: 1182.7 [P.O.:900; I.V.:282.7] Out: -  Intake/Output from this shift: No intake/output data recorded.  Physical Exam: Neck: no adenopathy, no carotid bruit, no JVD and supple, symmetrical, trachea midline Lungs: clear to auscultation bilaterally Heart: irregularly irregular rhythm, S1, S2 normal and soft systolic murmur noted Abdomen: soft, non-tender; bowel sounds normal; no masses,  no organomegaly Extremities: extremities normal, atraumatic, no cyanosis or edema and right groin stable  Lab Results: Recent Labs    12/31/17 0438 01/01/18 0446  WBC 9.1 7.7  HGB 12.0 13.3  PLT 228 237   Recent Labs    12/30/17 1225  NA 137  K 4.3  CL 99*  CO2 31  GLUCOSE 379*  BUN 11  CREATININE 1.22*   Recent Labs    12/29/17 1422 12/29/17 2002  TROPONINI <0.03 <0.03   Hepatic Function Panel No results for input(s): PROT, ALBUMIN, AST, ALT, ALKPHOS, BILITOT, BILIDIR, IBILI in the last 72 hours. Recent Labs    12/30/17 0436  CHOL 106   No results for input(s): PROTIME in the last 72 hours.  Imaging: Imaging results have been reviewed and No results found.  Cardiac Studies:  Assessment/Plan:  Atypical chest pain with some features worrisome for angina MI ruled out Abnormal nuclear stress test status post left cardiac catheterization for nonobstructive  CAD Status postAbdominal pain rule out GERD and gastritis/peptic ulcer disease Compensated systolic/diastolic congestive heart failure. Chronic atrial fibrillation. Hypertension. Diabetes mellitus COPD Obstructive sleep apnea/obesity hypoventilation syndrome. Morbid obesity. Chronic kidney disease. History of DVT. Depression. Degenerative joint disease. Plan Restart pradaxa 150 mg twice daily Okay to discharge from cardiac point of view. We will get GI workup if continues to have chest pain as outpatient.   LOS: 3 days    Charolette Forward 01/01/2018, 9:22 AM

## 2018-01-01 NOTE — Plan of Care (Signed)
  Problem: Education: Goal: Knowledge of General Education information will improve Outcome: Adequate for Discharge   Problem: Health Behavior/Discharge Planning: Goal: Ability to manage health-related needs will improve Outcome: Adequate for Discharge   Problem: Clinical Measurements: Goal: Ability to maintain clinical measurements within normal limits will improve Outcome: Adequate for Discharge Goal: Will remain free from infection Outcome: Adequate for Discharge Goal: Diagnostic test results will improve Outcome: Adequate for Discharge Goal: Respiratory complications will improve Outcome: Adequate for Discharge Goal: Cardiovascular complication will be avoided Outcome: Adequate for Discharge   Problem: Activity: Goal: Risk for activity intolerance will decrease Outcome: Adequate for Discharge   Problem: Nutrition: Goal: Adequate nutrition will be maintained Outcome: Adequate for Discharge   Problem: Coping: Goal: Level of anxiety will decrease Outcome: Adequate for Discharge   Problem: Elimination: Goal: Will not experience complications related to bowel motility Outcome: Adequate for Discharge Goal: Will not experience complications related to urinary retention Outcome: Adequate for Discharge   Problem: Pain Managment: Goal: General experience of comfort will improve Outcome: Adequate for Discharge   Problem: Safety: Goal: Ability to remain free from injury will improve Outcome: Adequate for Discharge   Problem: Skin Integrity: Goal: Risk for impaired skin integrity will decrease Outcome: Adequate for Discharge   Problem: Education: Goal: Ability to demonstrate management of disease process will improve Outcome: Adequate for Discharge Goal: Ability to verbalize understanding of medication therapies will improve Outcome: Adequate for Discharge   Problem: Activity: Goal: Capacity to carry out activities will improve Outcome: Adequate for Discharge    Problem: Cardiac: Goal: Ability to achieve and maintain adequate cardiopulmonary perfusion will improve Outcome: Adequate for Discharge

## 2018-01-02 MED FILL — Heparin Sod (Porcine)-NaCl IV Soln 1000 Unit/500ML-0.9%: INTRAVENOUS | Qty: 1000 | Status: AC

## 2018-01-04 DIAGNOSIS — E119 Type 2 diabetes mellitus without complications: Secondary | ICD-10-CM | POA: Diagnosis not present

## 2018-01-07 DIAGNOSIS — E1122 Type 2 diabetes mellitus with diabetic chronic kidney disease: Secondary | ICD-10-CM | POA: Diagnosis not present

## 2018-01-07 DIAGNOSIS — I482 Chronic atrial fibrillation: Secondary | ICD-10-CM | POA: Diagnosis not present

## 2018-01-07 DIAGNOSIS — N189 Chronic kidney disease, unspecified: Secondary | ICD-10-CM | POA: Diagnosis not present

## 2018-01-07 DIAGNOSIS — M1990 Unspecified osteoarthritis, unspecified site: Secondary | ICD-10-CM | POA: Diagnosis not present

## 2018-01-07 DIAGNOSIS — I509 Heart failure, unspecified: Secondary | ICD-10-CM | POA: Diagnosis not present

## 2018-01-07 DIAGNOSIS — I1310 Hypertensive heart and chronic kidney disease without heart failure, with stage 1 through stage 4 chronic kidney disease, or unspecified chronic kidney disease: Secondary | ICD-10-CM | POA: Diagnosis not present

## 2018-01-07 DIAGNOSIS — Z6841 Body Mass Index (BMI) 40.0 and over, adult: Secondary | ICD-10-CM | POA: Diagnosis not present

## 2018-01-07 DIAGNOSIS — E785 Hyperlipidemia, unspecified: Secondary | ICD-10-CM | POA: Diagnosis not present

## 2018-01-15 DIAGNOSIS — H35342 Macular cyst, hole, or pseudohole, left eye: Secondary | ICD-10-CM | POA: Diagnosis not present

## 2018-01-15 DIAGNOSIS — H43813 Vitreous degeneration, bilateral: Secondary | ICD-10-CM | POA: Diagnosis not present

## 2018-01-15 DIAGNOSIS — H43823 Vitreomacular adhesion, bilateral: Secondary | ICD-10-CM | POA: Diagnosis not present

## 2018-01-28 DIAGNOSIS — I509 Heart failure, unspecified: Secondary | ICD-10-CM | POA: Diagnosis not present

## 2018-02-27 DIAGNOSIS — I509 Heart failure, unspecified: Secondary | ICD-10-CM | POA: Diagnosis not present

## 2018-03-07 DIAGNOSIS — G4733 Obstructive sleep apnea (adult) (pediatric): Secondary | ICD-10-CM | POA: Diagnosis not present

## 2018-03-30 DIAGNOSIS — I509 Heart failure, unspecified: Secondary | ICD-10-CM | POA: Diagnosis not present

## 2018-04-08 DIAGNOSIS — E1140 Type 2 diabetes mellitus with diabetic neuropathy, unspecified: Secondary | ICD-10-CM | POA: Diagnosis not present

## 2018-04-08 DIAGNOSIS — E785 Hyperlipidemia, unspecified: Secondary | ICD-10-CM | POA: Diagnosis not present

## 2018-04-08 DIAGNOSIS — G4733 Obstructive sleep apnea (adult) (pediatric): Secondary | ICD-10-CM | POA: Diagnosis not present

## 2018-04-08 DIAGNOSIS — I482 Chronic atrial fibrillation: Secondary | ICD-10-CM | POA: Diagnosis not present

## 2018-04-08 DIAGNOSIS — N189 Chronic kidney disease, unspecified: Secondary | ICD-10-CM | POA: Diagnosis not present

## 2018-04-08 DIAGNOSIS — I10 Essential (primary) hypertension: Secondary | ICD-10-CM | POA: Diagnosis not present

## 2018-04-08 DIAGNOSIS — M1990 Unspecified osteoarthritis, unspecified site: Secondary | ICD-10-CM | POA: Diagnosis not present

## 2018-04-08 DIAGNOSIS — I5020 Unspecified systolic (congestive) heart failure: Secondary | ICD-10-CM | POA: Diagnosis not present

## 2018-04-15 DIAGNOSIS — I10 Essential (primary) hypertension: Secondary | ICD-10-CM | POA: Diagnosis not present

## 2018-04-15 DIAGNOSIS — E119 Type 2 diabetes mellitus without complications: Secondary | ICD-10-CM | POA: Diagnosis not present

## 2018-04-15 DIAGNOSIS — E785 Hyperlipidemia, unspecified: Secondary | ICD-10-CM | POA: Diagnosis not present

## 2018-04-21 DIAGNOSIS — J189 Pneumonia, unspecified organism: Secondary | ICD-10-CM

## 2018-04-21 HISTORY — DX: Pneumonia, unspecified organism: J18.9

## 2018-04-30 DIAGNOSIS — I509 Heart failure, unspecified: Secondary | ICD-10-CM | POA: Diagnosis not present

## 2018-05-14 ENCOUNTER — Encounter (HOSPITAL_COMMUNITY): Payer: Self-pay

## 2018-05-14 ENCOUNTER — Inpatient Hospital Stay (HOSPITAL_COMMUNITY)
Admission: EM | Admit: 2018-05-14 | Discharge: 2018-05-20 | DRG: 194 | Disposition: A | Discharge status: Home Health | Payer: Medicare HMO | Attending: Cardiology | Admitting: Cardiology

## 2018-05-14 ENCOUNTER — Emergency Department (HOSPITAL_COMMUNITY): Payer: Medicare HMO

## 2018-05-14 ENCOUNTER — Other Ambulatory Visit: Payer: Self-pay

## 2018-05-14 DIAGNOSIS — R0902 Hypoxemia: Secondary | ICD-10-CM | POA: Diagnosis not present

## 2018-05-14 DIAGNOSIS — Z86718 Personal history of other venous thrombosis and embolism: Secondary | ICD-10-CM | POA: Diagnosis not present

## 2018-05-14 DIAGNOSIS — R079 Chest pain, unspecified: Secondary | ICD-10-CM | POA: Diagnosis not present

## 2018-05-14 DIAGNOSIS — J189 Pneumonia, unspecified organism: Principal | ICD-10-CM | POA: Diagnosis present

## 2018-05-14 DIAGNOSIS — Z794 Long term (current) use of insulin: Secondary | ICD-10-CM | POA: Diagnosis not present

## 2018-05-14 DIAGNOSIS — I110 Hypertensive heart disease with heart failure: Secondary | ICD-10-CM | POA: Diagnosis not present

## 2018-05-14 DIAGNOSIS — J440 Chronic obstructive pulmonary disease with acute lower respiratory infection: Secondary | ICD-10-CM | POA: Diagnosis present

## 2018-05-14 DIAGNOSIS — Z7902 Long term (current) use of antithrombotics/antiplatelets: Secondary | ICD-10-CM

## 2018-05-14 DIAGNOSIS — I482 Chronic atrial fibrillation: Secondary | ICD-10-CM | POA: Diagnosis present

## 2018-05-14 DIAGNOSIS — F329 Major depressive disorder, single episode, unspecified: Secondary | ICD-10-CM | POA: Diagnosis present

## 2018-05-14 DIAGNOSIS — E860 Dehydration: Secondary | ICD-10-CM | POA: Diagnosis not present

## 2018-05-14 DIAGNOSIS — I130 Hypertensive heart and chronic kidney disease with heart failure and stage 1 through stage 4 chronic kidney disease, or unspecified chronic kidney disease: Secondary | ICD-10-CM | POA: Diagnosis not present

## 2018-05-14 DIAGNOSIS — K219 Gastro-esophageal reflux disease without esophagitis: Secondary | ICD-10-CM | POA: Diagnosis present

## 2018-05-14 DIAGNOSIS — I2510 Atherosclerotic heart disease of native coronary artery without angina pectoris: Secondary | ICD-10-CM | POA: Diagnosis present

## 2018-05-14 DIAGNOSIS — E669 Obesity, unspecified: Secondary | ICD-10-CM | POA: Diagnosis not present

## 2018-05-14 DIAGNOSIS — I5032 Chronic diastolic (congestive) heart failure: Secondary | ICD-10-CM | POA: Diagnosis not present

## 2018-05-14 DIAGNOSIS — N183 Chronic kidney disease, stage 3 (moderate): Secondary | ICD-10-CM | POA: Diagnosis not present

## 2018-05-14 DIAGNOSIS — I481 Persistent atrial fibrillation: Secondary | ICD-10-CM | POA: Diagnosis not present

## 2018-05-14 DIAGNOSIS — J168 Pneumonia due to other specified infectious organisms: Secondary | ICD-10-CM | POA: Diagnosis not present

## 2018-05-14 DIAGNOSIS — I499 Cardiac arrhythmia, unspecified: Secondary | ICD-10-CM | POA: Diagnosis not present

## 2018-05-14 DIAGNOSIS — I959 Hypotension, unspecified: Secondary | ICD-10-CM | POA: Diagnosis present

## 2018-05-14 DIAGNOSIS — E662 Morbid (severe) obesity with alveolar hypoventilation: Secondary | ICD-10-CM | POA: Diagnosis not present

## 2018-05-14 DIAGNOSIS — Z8249 Family history of ischemic heart disease and other diseases of the circulatory system: Secondary | ICD-10-CM

## 2018-05-14 DIAGNOSIS — E119 Type 2 diabetes mellitus without complications: Secondary | ICD-10-CM | POA: Diagnosis not present

## 2018-05-14 DIAGNOSIS — R296 Repeated falls: Secondary | ICD-10-CM | POA: Diagnosis not present

## 2018-05-14 DIAGNOSIS — D631 Anemia in chronic kidney disease: Secondary | ICD-10-CM | POA: Diagnosis present

## 2018-05-14 DIAGNOSIS — E875 Hyperkalemia: Secondary | ICD-10-CM | POA: Diagnosis present

## 2018-05-14 DIAGNOSIS — Z6841 Body Mass Index (BMI) 40.0 and over, adult: Secondary | ICD-10-CM | POA: Diagnosis not present

## 2018-05-14 DIAGNOSIS — I4891 Unspecified atrial fibrillation: Secondary | ICD-10-CM | POA: Diagnosis present

## 2018-05-14 DIAGNOSIS — N189 Chronic kidney disease, unspecified: Secondary | ICD-10-CM | POA: Diagnosis not present

## 2018-05-14 DIAGNOSIS — I10 Essential (primary) hypertension: Secondary | ICD-10-CM | POA: Diagnosis not present

## 2018-05-14 DIAGNOSIS — M1990 Unspecified osteoarthritis, unspecified site: Secondary | ICD-10-CM | POA: Diagnosis present

## 2018-05-14 DIAGNOSIS — J181 Lobar pneumonia, unspecified organism: Secondary | ICD-10-CM | POA: Diagnosis not present

## 2018-05-14 DIAGNOSIS — N179 Acute kidney failure, unspecified: Secondary | ICD-10-CM | POA: Diagnosis present

## 2018-05-14 DIAGNOSIS — R42 Dizziness and giddiness: Secondary | ICD-10-CM | POA: Diagnosis not present

## 2018-05-14 DIAGNOSIS — E1122 Type 2 diabetes mellitus with diabetic chronic kidney disease: Secondary | ICD-10-CM | POA: Diagnosis present

## 2018-05-14 DIAGNOSIS — I5042 Chronic combined systolic (congestive) and diastolic (congestive) heart failure: Secondary | ICD-10-CM | POA: Diagnosis not present

## 2018-05-14 DIAGNOSIS — E1159 Type 2 diabetes mellitus with other circulatory complications: Secondary | ICD-10-CM | POA: Diagnosis not present

## 2018-05-14 DIAGNOSIS — E1140 Type 2 diabetes mellitus with diabetic neuropathy, unspecified: Secondary | ICD-10-CM | POA: Diagnosis present

## 2018-05-14 DIAGNOSIS — I5040 Unspecified combined systolic (congestive) and diastolic (congestive) heart failure: Secondary | ICD-10-CM | POA: Diagnosis not present

## 2018-05-14 DIAGNOSIS — R0602 Shortness of breath: Secondary | ICD-10-CM | POA: Diagnosis not present

## 2018-05-14 HISTORY — DX: Pneumonia, unspecified organism: J18.9

## 2018-05-14 LAB — CBC
HCT: 43.3 % (ref 36.0–46.0)
Hemoglobin: 13.4 g/dL (ref 12.0–15.0)
MCH: 26.9 pg (ref 26.0–34.0)
MCHC: 30.9 g/dL (ref 30.0–36.0)
MCV: 86.8 fL (ref 78.0–100.0)
Platelets: 282 10*3/uL (ref 150–400)
RBC: 4.99 MIL/uL (ref 3.87–5.11)
RDW: 14.6 % (ref 11.5–15.5)
WBC: 17.5 10*3/uL — ABNORMAL HIGH (ref 4.0–10.5)

## 2018-05-14 LAB — DIGOXIN LEVEL: Digoxin Level: <0.2 ng/mL — ABNORMAL LOW (ref 0.8–2.0)

## 2018-05-14 LAB — I-STAT TROPONIN, ED: Troponin i, poc: 0.01 ng/mL (ref 0.00–0.08)

## 2018-05-14 LAB — BASIC METABOLIC PANEL
Anion gap: 22 — ABNORMAL HIGH (ref 5–15)
BUN: 29 mg/dL — ABNORMAL HIGH (ref 8–23)
CO2: 18 mmol/L — ABNORMAL LOW (ref 22–32)
Calcium: 9.7 mg/dL (ref 8.9–10.3)
Chloride: 99 mmol/L (ref 98–111)
Creatinine, Ser: 3.19 mg/dL — ABNORMAL HIGH (ref 0.44–1.00)
GFR calc Af Amer: 17 mL/min — ABNORMAL LOW (ref >60)
GFR calc non Af Amer: 15 mL/min — ABNORMAL LOW (ref >60)
Glucose, Bld: 118 mg/dL — ABNORMAL HIGH (ref 70–99)
Potassium: 5.3 mmol/L — ABNORMAL HIGH (ref 3.5–5.1)
Sodium: 139 mmol/L (ref 135–145)

## 2018-05-14 LAB — MAGNESIUM: Magnesium: 1.8 mg/dL (ref 1.7–2.4)

## 2018-05-14 MED ORDER — SODIUM CHLORIDE 0.9 % IV SOLN
500.0000 mg | Freq: Once | INTRAVENOUS | Status: AC
  Administered 2018-05-14: 500 mg via INTRAVENOUS
  Filled 2018-05-14: qty 500

## 2018-05-14 MED ORDER — ROSUVASTATIN CALCIUM 10 MG PO TABS
10.0000 mg | ORAL_TABLET | Freq: Every day | ORAL | Status: DC
  Administered 2018-05-15 – 2018-05-20 (×6): 10 mg via ORAL
  Filled 2018-05-14 (×6): qty 1

## 2018-05-14 MED ORDER — SODIUM CHLORIDE 0.9 % IV SOLN
1.0000 g | Freq: Once | INTRAVENOUS | Status: AC
  Administered 2018-05-14: 1 g via INTRAVENOUS
  Filled 2018-05-14: qty 10

## 2018-05-14 MED ORDER — INSULIN ASPART 100 UNIT/ML ~~LOC~~ SOLN
0.0000 [IU] | Freq: Three times a day (TID) | SUBCUTANEOUS | Status: DC
  Administered 2018-05-15: 3 [IU] via SUBCUTANEOUS
  Administered 2018-05-15: 2 [IU] via SUBCUTANEOUS
  Administered 2018-05-15: 3 [IU] via SUBCUTANEOUS
  Administered 2018-05-16: 7 [IU] via SUBCUTANEOUS
  Administered 2018-05-16 – 2018-05-17 (×2): 1 [IU] via SUBCUTANEOUS
  Administered 2018-05-17: 2 [IU] via SUBCUTANEOUS
  Administered 2018-05-17 – 2018-05-18 (×3): 3 [IU] via SUBCUTANEOUS
  Administered 2018-05-18: 1 [IU] via SUBCUTANEOUS
  Administered 2018-05-19: 3 [IU] via SUBCUTANEOUS
  Administered 2018-05-19 – 2018-05-20 (×3): 2 [IU] via SUBCUTANEOUS

## 2018-05-14 MED ORDER — ONDANSETRON HCL 4 MG/2ML IJ SOLN
INTRAMUSCULAR | Status: AC
  Administered 2018-05-14: 4 mg via INTRAVENOUS
  Filled 2018-05-14: qty 2

## 2018-05-14 MED ORDER — SODIUM CHLORIDE 0.9 % IV SOLN
INTRAVENOUS | Status: DC
  Administered 2018-05-15 – 2018-05-17 (×2): via INTRAVENOUS

## 2018-05-14 MED ORDER — ALBUTEROL SULFATE (2.5 MG/3ML) 0.083% IN NEBU
3.0000 mL | INHALATION_SOLUTION | RESPIRATORY_TRACT | Status: DC | PRN
  Administered 2018-05-15 (×2): 3 mL via RESPIRATORY_TRACT
  Filled 2018-05-14 (×2): qty 3

## 2018-05-14 MED ORDER — HEPARIN BOLUS VIA INFUSION
5500.0000 [IU] | Freq: Once | INTRAVENOUS | Status: DC
  Filled 2018-05-14: qty 5500

## 2018-05-14 MED ORDER — FUROSEMIDE 10 MG/ML IJ SOLN
40.0000 mg | Freq: Once | INTRAMUSCULAR | Status: AC
  Administered 2018-05-14: 40 mg via INTRAVENOUS
  Filled 2018-05-14: qty 4

## 2018-05-14 MED ORDER — LEVALBUTEROL HCL 1.25 MG/0.5ML IN NEBU
1.2500 mg | INHALATION_SOLUTION | Freq: Four times a day (QID) | RESPIRATORY_TRACT | Status: DC
  Administered 2018-05-14 – 2018-05-17 (×11): 1.25 mg via RESPIRATORY_TRACT
  Filled 2018-05-14 (×13): qty 0.5

## 2018-05-14 MED ORDER — DULOXETINE HCL 60 MG PO CPEP
60.0000 mg | ORAL_CAPSULE | Freq: Every day | ORAL | Status: DC
  Administered 2018-05-15 – 2018-05-20 (×6): 60 mg via ORAL
  Filled 2018-05-14 (×6): qty 1

## 2018-05-14 MED ORDER — METOPROLOL TARTRATE 25 MG PO TABS
25.0000 mg | ORAL_TABLET | Freq: Two times a day (BID) | ORAL | Status: DC
  Administered 2018-05-15: 25 mg via ORAL
  Filled 2018-05-14: qty 1

## 2018-05-14 MED ORDER — DILTIAZEM HCL-DEXTROSE 100-5 MG/100ML-% IV SOLN (PREMIX)
5.0000 mg/h | INTRAVENOUS | Status: DC
  Administered 2018-05-14: 5 mg/h via INTRAVENOUS
  Administered 2018-05-15: 15 mg/h via INTRAVENOUS
  Administered 2018-05-15: 10 mg/h via INTRAVENOUS
  Administered 2018-05-15: 12.5 mg/h via INTRAVENOUS
  Filled 2018-05-14 (×2): qty 100

## 2018-05-14 MED ORDER — INSULIN DETEMIR 100 UNIT/ML ~~LOC~~ SOLN
10.0000 [IU] | Freq: Every day | SUBCUTANEOUS | Status: DC
  Administered 2018-05-15 – 2018-05-19 (×5): 10 [IU] via SUBCUTANEOUS
  Filled 2018-05-14 (×6): qty 0.1

## 2018-05-14 MED ORDER — ONDANSETRON HCL 4 MG/2ML IJ SOLN
4.0000 mg | Freq: Once | INTRAMUSCULAR | Status: AC
  Administered 2018-05-14: 4 mg via INTRAVENOUS

## 2018-05-14 MED ORDER — HEPARIN (PORCINE) IN NACL 100-0.45 UNIT/ML-% IJ SOLN
2200.0000 [IU]/h | INTRAMUSCULAR | Status: DC
  Administered 2018-05-14: 1400 [IU]/h via INTRAVENOUS
  Administered 2018-05-15: 1800 [IU]/h via INTRAVENOUS
  Administered 2018-05-16: 2200 [IU]/h via INTRAVENOUS
  Filled 2018-05-14 (×3): qty 250

## 2018-05-14 MED ORDER — SODIUM CHLORIDE 0.9 % IV BOLUS
1000.0000 mL | Freq: Once | INTRAVENOUS | Status: AC
  Administered 2018-05-14: 1000 mL via INTRAVENOUS

## 2018-05-14 MED ORDER — METOPROLOL TARTRATE 5 MG/5ML IV SOLN
5.0000 mg | Freq: Once | INTRAVENOUS | Status: AC
  Administered 2018-05-14: 5 mg via INTRAVENOUS
  Filled 2018-05-14: qty 5

## 2018-05-14 MED ORDER — SODIUM CHLORIDE 0.9 % IV SOLN
1.0000 g | INTRAVENOUS | Status: DC
  Administered 2018-05-15 – 2018-05-19 (×5): 1 g via INTRAVENOUS
  Filled 2018-05-14 (×6): qty 10

## 2018-05-14 MED ORDER — DILTIAZEM LOAD VIA INFUSION
20.0000 mg | Freq: Once | INTRAVENOUS | Status: AC
  Administered 2018-05-14: 20 mg via INTRAVENOUS
  Filled 2018-05-14: qty 20

## 2018-05-14 MED ORDER — CYCLOSPORINE 0.05 % OP EMUL
1.0000 [drp] | Freq: Two times a day (BID) | OPHTHALMIC | Status: DC
  Administered 2018-05-15 – 2018-05-20 (×12): 1 [drp] via OPHTHALMIC
  Filled 2018-05-14 (×13): qty 1

## 2018-05-14 MED ORDER — SODIUM CHLORIDE 0.9 % IV SOLN
500.0000 mg | INTRAVENOUS | Status: DC
  Administered 2018-05-15 – 2018-05-17 (×3): 500 mg via INTRAVENOUS
  Filled 2018-05-14 (×4): qty 500

## 2018-05-14 NOTE — ED Provider Notes (Addendum)
[name]ERGENCY DEPARTMENT Provider Note   CSN: [id number] Arrival date & time: 05/14/18  1815     History   Chief Complaint Chief Complaint  Patient presents with  . Shortness of Breath  . Atrial Fibrillation    HPI [name] is a 62 y.o. female.  Pt presents to the ED today with sob.  The pt said she's been feeling sick for several days and has not taken her meds since Friday, Sept. 20th.  She is not sure if she's been taking her pradaxa, but may have taken it last night.  EMS said pt was in afib with rvr.  She is not sure how long she's been in afib with rvr, but has chronic afib.   They were unable to get an IV, so she has not been given any meds as of yet.  She is followed by Dr. Terrence Dupont.  CHA2DS2/VAS Stroke Risk Points  Current as of 8 minutes ago     2 >= 2 Points: High Risk  1 - 1.99 Points: Medium Risk  0 Points: Low Risk    This is the only CHA2DS2/VAS Stroke Risk Points available for the past  year.:  Last Change: N/A     Details    This score determines the patient's risk of having a stroke if the  patient has atrial fibrillation.       Points Metrics  0 Has Congestive Heart Failure:  No    Current as of 8 minutes ago  0 Has Vascular Disease:  No    Current as of 8 minutes ago  0 Has Hypertension:  No    Current as of 8 minutes ago  0 Age:  11    Current as of 8 minutes ago  1 Has Diabetes:  Yes    Current as of 8 minutes ago  0 Had Stroke:  No  Had TIA:  No  Had thromboembolism:  No    Current as of 8 minutes ago  1 Female:  Yes    Current as of 8 minutes ago              Past Medical History:  Diagnosis Date  . Allergic rhinitis   . Arthritis   . Asthma   . Chest pain 12/27/2017  . Chronic diastolic CHF (congestive heart failure) ([place])   . COPD (chronic obstructive pulmonary disease) ([place])   . Depression   . DM (diabetes mellitus) ([place])   . DVT (deep vein thrombosis) in pregnancy ([place])   . Dysrhythmia     atrial fibrilation  . GERD (gastroesophageal reflux disease)   . Headache(784.0)   . HTN (hypertension)   . Hyperlipidemia   . Obesity   . Shortness of breath   . Sleep apnea    compliant with CPAP    Patient Active Problem List   Diagnosis Date Noted  . Right upper lobe pneumonia ([place]) 05/14/2018  . Atrial fibrillation with RVR ([place]) 05/14/2018  . Chronic atrial fibrillation ([place]) 12/26/2017  . Diastolic dysfunction 44/08/270  . Diabetes mellitus type 2 in obese ([place]) 12/26/2017  . Nausea vomiting and diarrhea   . Chest pain 02/26/2012  . OSA (obstructive sleep apnea) 09/21/2011  . Hypoxia 09/21/2011  . Diabetes mellitus ([place]) 02/04/2010  . Morbid obesity ([place]) 02/04/2010  . Atrial fibrillation ([place]) 02/04/2010  . GASTROPARESIS 02/04/2010  . GASTROENTERITIS 02/04/2010    Past Surgical History:  Procedure Laterality Date  . ABDOMINAL HYSTERECTOMY    .  CATARACT EXTRACTION, BILATERAL    . CHOLECYSTECTOMY    . COLONOSCOPY WITH PROPOFOL N/A 03/05/2015   Procedure: COLONOSCOPY WITH PROPOFOL;  Surgeon: Carol Ada, MD;  Location: [facility];  Service: Endoscopy;  Laterality: N/A;  . DIAGNOSTIC LAPAROSCOPY    . ingrown hallux Left   . KNEE SURGERY    . LEFT HEART CATH AND CORONARY ANGIOGRAPHY N/A 12/31/2017   Procedure: LEFT HEART CATH AND CORONARY ANGIOGRAPHY;  Surgeon: Charolette Forward, MD;  Location: [facility];  Service: Cardiovascular;  Laterality: N/A;     OB History   None      Home Medications    Prior to Admission medications   Medication Sig Start Date End Date Taking? Authorizing Provider  clorazepate (TRANXENE) 7.5 MG tablet Take 7.5 mg by mouth daily.    [provider]  dabigatran (PRADAXA) 150 MG CAPS capsule Take 1 capsule (150 mg total) by mouth every 12 (twelve) hours. 04/12/16   Florencia Reasons, MD  digoxin (LANOXIN) 0.25 MG tablet Take 1 tablet (0.25 mg total) by mouth daily. 01/02/18   Regalado, Belkys A, MD  DULoxetine (CYMBALTA) 60  MG capsule Take 60 mg by mouth daily.    [provider]  furosemide (LASIX) 40 MG tablet Take 1 tablet (40 mg total) by mouth daily as needed for fluid or edema. 04/12/16   Florencia Reasons, MD  gabapentin (NEURONTIN) 300 MG capsule Take 300 mg by mouth 2 (two) times daily.  09/13/11   [provider]  insulin aspart (NOVOLOG) 100 UNIT/ML injection Inject 25 Units into the skin 2 (two) times daily.     [provider]  insulin detemir (LEVEMIR) 100 UNIT/ML injection Inject 35 Units into the skin daily.     [provider]  losartan (COZAAR) 50 MG tablet Take 50 mg by mouth daily.    [provider]  metoprolol tartrate (LOPRESSOR) 25 MG tablet Take 1 tablet (25 mg total) by mouth 3 (three) times daily. 01/01/18   Regalado, Belkys A, MD  Multiple Vitamins-Minerals (MULTIVITAMIN WOMEN PO) Take 1 tablet by mouth daily.    [provider]  nitroGLYCERIN (NITROSTAT) 0.4 MG SL tablet Place 1 tablet (0.4 mg total) under the tongue every 5 (five) minutes x 3 doses as needed for chest pain. 01/08/14   Charolette Forward, MD  pantoprazole (PROTONIX) 40 MG tablet Take 1 tablet (40 mg total) by mouth 2 (two) times daily. 01/01/18   Regalado, Belkys A, MD  PROAIR HFA 108 (90 BASE) MCG/ACT inhaler Inhale 2 puffs into the lungs every 4 (four) hours as needed for wheezing (wheezing).  05/28/12   [provider]  RESTASIS 0.05 % ophthalmic emulsion Place 1 drop into both eyes 2 (two) times daily. 11/19/17   [provider]  rosuvastatin (CRESTOR) 10 MG tablet Take 10 mg by mouth daily with breakfast.    [provider]  sitaGLIPtin (JANUVIA) 50 MG tablet Take 50 mg by mouth daily with breakfast.    [provider]  spironolactone (ALDACTONE) 25 MG tablet Take 25 mg by mouth daily.  09/13/11   [provider]  sucralfate (CARAFATE) 1 GM/10ML suspension Take 10 mLs (1 g total) by mouth 4 (four) times daily -  with meals and at bedtime.  01/01/18   Regalado, Belkys A, MD  traMADol (ULTRAM) 50 MG tablet Take 1 tablet (50 mg total) by mouth every 8 (eight) hours as needed for moderate pain. 01/01/18   Regalado, Cassie Freer, MD  Family History Family History  Problem Relation Age of Onset  . Cancer Mother   . Hypertension Mother   . Cancer Father   . CAD Unknown   . Hypertension Sister   . Hypertension Brother     Social History Social History   Tobacco Use  . Smoking status: Never Smoker  . Smokeless tobacco: Never Used  Substance Use Topics  . Alcohol use: No  . Drug use: No     Allergies   Nsaids and Sulfa antibiotics   Review of Systems Review of Systems  Respiratory: Positive for shortness of breath.   Cardiovascular: Positive for palpitations.  All other systems reviewed and are negative.    Physical Exam Updated Vital Signs BP (!) 130/95   Pulse 60   Resp (!) 36   Ht 5\' 7"  (1.702 m)   Wt 129.3 kg   SpO2 96%   BMI 44.64 kg/m   Physical Exam  Constitutional: She is oriented to person, place, and time. She appears well-developed. She appears ill. She appears distressed.  HENT:  Head: Normocephalic and atraumatic.  Mouth/Throat: Oropharynx is clear and moist.  Eyes: Pupils are equal, round, and reactive to light. EOM are normal.  Neck: Normal range of motion. Neck supple.  Cardiovascular: An irregularly irregular rhythm present. Tachycardia present.  Pulmonary/Chest: Breath sounds normal. Tachypnea noted.  Abdominal: Soft. Bowel sounds are normal.  Musculoskeletal: Normal range of motion.       Right lower leg: Normal.       Left lower leg: Normal.  Neurological: She is alert and oriented to person, place, and time.  Skin: Skin is warm and dry. Capillary refill takes less than 2 seconds.  Psychiatric: She has a normal mood and affect. Her behavior is normal.  Nursing note and vitals reviewed.    ED Treatments / Results  Labs (all labs ordered are listed, but only abnormal results  are displayed) Labs Reviewed  BASIC METABOLIC PANEL - Abnormal; Notable for the following components:      Result Value   Potassium 5.3 (*)    CO2 18 (*)    Glucose, Bld 118 (*)    BUN 29 (*)    Creatinine, Ser 3.19 (*)    GFR calc non Af Amer 15 (*)    GFR calc Af Amer 17 (*)    Anion gap 22 (*)    All other components within normal limits  CBC - Abnormal; Notable for the following components:   WBC 17.5 (*)    All other components within normal limits  DIGOXIN LEVEL - Abnormal; Notable for the following components:   Digoxin Level <0.2 (*)    All other components within normal limits  CULTURE, BLOOD (ROUTINE X 2)  CULTURE, BLOOD (ROUTINE X 2)  MAGNESIUM  CBC  I-STAT TROPONIN, ED  I-STAT CG4 LACTIC ACID, ED  I-STAT CG4 LACTIC ACID, ED    EKG EKG Interpretation  Date/Time:  Tuesday May 14 2018 18:38:10 EDT Ventricular Rate:  190 PR Interval:    QRS Duration: 102 QT Interval:  274 QTC Calculation: 488 R Axis:   177 Text Interpretation:  Atrial fibrillation with rapid V-rate Anteroseptal infarct, age indeterminate Lateral leads are also involved Baseline wander in lead(s) II III aVF V1 V5 V6 Since last tracing rate faster Confirmed by Isla Pence [phone]) on 05/14/2018 6:41:36 PM   Radiology Dg Chest Port 1 View  Result Date: 05/14/2018 CLINICAL DATA:  Shortness of breath, chest pain. EXAM: PORTABLE CHEST  1 VIEW COMPARISON:  Radiographs of Dec 26, 2017. FINDINGS: Stable cardiomegaly. No pneumothorax or pleural effusion is noted. Left lung is clear. New airspace opacity is noted in right upper lobe most consistent with pneumonia. Bony thorax is unremarkable. IMPRESSION: New right upper lobe airspace opacity is noted most consistent with pneumonia, although lateral radiograph projection of the chest is recommended for further evaluation and to rule out neoplasm. Electronically Signed   By: Marijo Conception, M.D.   On: 05/14/2018 19:12    Procedures Procedures  (including critical care time)  Medications Ordered in ED Medications  diltiazem (CARDIZEM) 1 mg/mL load via infusion 20 mg (20 mg Intravenous Bolus from Bag 05/14/18 1843)    And  diltiazem (CARDIZEM) 100 mg in dextrose 5% 152mL (1 mg/mL) infusion (10 mg/hr Intravenous Rate/Dose Change 05/14/18 1955)  heparin ADULT infusion 100 units/mL (25000 units/2106mL sodium chloride 0.45%) (0 Units/hr Intravenous Paused 05/14/18 2158)  furosemide (LASIX) injection 40 mg (has no administration in time range)  metoprolol tartrate (LOPRESSOR) injection 5 mg (5 mg Intravenous Given 05/14/18 1859)  cefTRIAXone (ROCEPHIN) 1 g in sodium chloride 0.9 % 100 mL IVPB (0 g Intravenous Stopped 05/14/18 2044)  azithromycin (ZITHROMAX) 500 mg in sodium chloride 0.9 % 250 mL IVPB (500 mg Intravenous New Bag/Given 05/14/18 2100)  sodium chloride 0.9 % bolus 1,000 mL (0 mLs Intravenous Stopped 05/14/18 2143)  ondansetron (ZOFRAN) 4 MG/2ML injection (4 mg  Given 05/14/18 2016)  ondansetron (ZOFRAN) injection 4 mg (4 mg Intravenous Given 05/14/18 2015)     Initial Impression / Assessment and Plan / ED Course  I have reviewed the triage vital signs and the nursing notes.  Pertinent labs & imaging results that were available during my care of the patient were reviewed by me and considered in my medical decision making (see chart for details).  CRITICAL CARE Performed by: Isla Pence   Total critical care time: 30 minutes  Critical care time was exclusive of separately billable procedures and treating other patients.  Critical care was necessary to treat or prevent imminent or life-threatening deterioration.  Critical care was time spent personally by me on the following activities: development of treatment plan with patient and/or surrogate as well as nursing, discussions with consultants, evaluation of patient's response to treatment, examination of patient, obtaining history from patient or surrogate, ordering and  performing treatments and interventions, ordering and review of laboratory studies, ordering and review of radiographic studies, pulse oximetry and re-evaluation of patient's condition.    Pt given cardizem 20 mg bolus then put on 5 mg drip.  This helped HR which came down from 190 to 120s.   Pt also given 5 mg lopressor iv and drip increased to 7.5.  Pt also not clear if she's been taking her blood thinners, so I put her on heparin.  The pt also has pna, so she was put on rocephin and zithromax.  The pt has AKI from dehydration.  I gave pt IVFs.  Pt also put on 2L [place] for comfort.  She was d/w Dr. Terrence Dupont for admission.  While waiting for a bed, resp status worsened.  She was put on bipap which has helped.  Final Clinical Impressions(s) / ED Diagnoses   Final diagnoses:  Atrial fibrillation with rapid ventricular response ([place])  Community acquired pneumonia of right upper lobe of lung ([place])  AKI (acute kidney injury) [facility])    ED Discharge Orders    None       Isla Pence,  MD 05/14/18 2012    Isla Pence, MD 05/14/18 2219

## 2018-05-14 NOTE — Progress Notes (Signed)
Patient transported to [facility] on [address].  Took longer moving patient from ED bed to floor bed due to to much extra furniture in the room to allow for BIPAP set up and patient transition.  Pt stable on BIPAP RT is aware of patient.

## 2018-05-14 NOTE — ED Triage Notes (Addendum)
Pt brought in by EMS due to having SOB, generalized weakness, and AND pain. Pt found to be in A.fib RVR rate from 150-180. BP was 92/40. Pt has not taken HR meds since Friday. Pt a&ox4.

## 2018-05-14 NOTE — Progress Notes (Signed)
ANTICOAGULATION CONSULT NOTE - Follow Up Consult  Pharmacy Consult for heparin  Indication: atrial fibrillation  Allergies  Allergen Reactions  . Nsaids Other (See Comments)    On warfarin  . Sulfa Antibiotics Itching    Patient Measurements:   Heparin Dosing Weight: 92.7  Vital Signs: BP: 99/78 (09/24 1946) Pulse Rate: 121 (09/24 1946)  Labs: Recent Labs    05/14/18 1830  HGB 13.4  HCT 43.3  PLT 282  CREATININE 3.19*    CrCl cannot be calculated (Unknown ideal weight.).   Assessment: 62 yo F admitted with SOB found to be in Afib with RVR. Heparin started for anticoagulation. CBC WNL. Pt on pradaxa PTA but is unsure of when the last time she took her medications.   Goal of Therapy:  Heparin level 0.3-0.7 units/ml Monitor platelets by anticoagulation protocol: Yes   Plan:  Heparin 1400 unit/hr IV infusion 6 hr heparin level Daily heparin level, CBC  Harrietta Guardian, PharmD PGY1 Pharmacy Resident 05/14/2018    8:22 PM

## 2018-05-14 NOTE — H&P (Signed)
[name] is an 62 y.o. female.   Chief Complaint: cough associated with progressive increasing shortness of breath and palpitations HPI: patient is 61 year old female with past medical history significant for hypertension, type 2 diabetes mellitus, chronic atrial fibrillation on chronic anticoagulation, nonobstructive CAD, COPD, obstructive sleep apnea/obesity hypoventilation syndrome, history of congestive heart failure secondary to systolic/diastolic dysfunction, chronic kidney disease stage III, history of DVT in the past, depression, and degenerative joint disease, morbid obesity, positive family history of coronary artery disease, came to the ER complaining of cough associated with progressive increasing shortness of breath and not feeling well for last few days did not seek any medical attention but stopped all medication today breathing got worst associated with palpitation decided to call EMS. Patient in the ED was noted to be in A. Fib with RVR and x-ray showed new right upper lobe infiltrate with elevated white count of 17,000. Patient was started on IV Cardizem after bolus with control of heart rate in 120s and was started on IV antibiotics after pan cultures.  Past Medical History:  Diagnosis Date  . Allergic rhinitis   . Arthritis   . Asthma   . Chest pain 12/27/2017  . Chronic diastolic CHF (congestive heart failure) ([place])   . COPD (chronic obstructive pulmonary disease) ([place])   . Depression   . DM (diabetes mellitus) ([place])   . DVT (deep vein thrombosis) in pregnancy ([place])   . Dysrhythmia    atrial fibrilation  . GERD (gastroesophageal reflux disease)   . Headache(784.0)   . HTN (hypertension)   . Hyperlipidemia   . Obesity   . Shortness of breath   . Sleep apnea    compliant with CPAP    Past Surgical History:  Procedure Laterality Date  . ABDOMINAL HYSTERECTOMY    . CATARACT EXTRACTION, BILATERAL    . CHOLECYSTECTOMY    . COLONOSCOPY WITH PROPOFOL N/A  03/05/2015   Procedure: COLONOSCOPY WITH PROPOFOL;  Surgeon: Carol Ada, MD;  Location: [facility];  Service: Endoscopy;  Laterality: N/A;  . DIAGNOSTIC LAPAROSCOPY    . ingrown hallux Left   . KNEE SURGERY    . LEFT HEART CATH AND CORONARY ANGIOGRAPHY N/A 12/31/2017   Procedure: LEFT HEART CATH AND CORONARY ANGIOGRAPHY;  Surgeon: Charolette Forward, MD;  Location: [facility];  Service: Cardiovascular;  Laterality: N/A;    Family History  Problem Relation Age of Onset  . Cancer Mother   . Hypertension Mother   . Cancer Father   . CAD Unknown   . Hypertension Sister   . Hypertension Brother    Social History:  reports that she has never smoked. She has never used smokeless tobacco. She reports that she does not drink alcohol or use drugs.  Allergies:  Allergies  Allergen Reactions  . Nsaids Other (See Comments)    On warfarin  . Sulfa Antibiotics Itching     (Not in a hospital admission)  Results for orders placed or performed during the hospital encounter of 05/14/18 (from the past 48 hour(s))  Basic metabolic panel     Status: Abnormal   Collection Time: 05/14/18  6:30 PM  Result Value Ref Range   Sodium 139 135 - 145 mmol/L   Potassium 5.3 (H) 3.5 - 5.1 mmol/L   Chloride 99 98 - 111 mmol/L   CO2 18 (L) 22 - 32 mmol/L   Glucose, Bld 118 (H) 70 - 99 mg/dL   BUN 29 (H) 8 - 23 mg/dL  Creatinine, Ser 3.19 (H) 0.44 - 1.00 mg/dL   Calcium 9.7 8.9 - 10.3 mg/dL   GFR calc non Af Amer 15 (L) >60 mL/min   GFR calc Af Amer 17 (L) >60 mL/min    Comment: (NOTE) The eGFR has been calculated using the CKD EPI equation. This calculation has not been validated in all clinical situations. eGFR's persistently <60 mL/min signify possible Chronic Kidney Disease.    Anion gap 22 (H) 5 - 15    Comment: Performed at [facility], [place] [address], [place], [place] [address]  Magnesium     Status: None   Collection Time: 05/14/18  6:30 PM  Result Value Ref Range   Magnesium  1.8 1.7 - 2.4 mg/dL    Comment: Performed at [facility] [address] [place], [place] [address]  CBC     Status: Abnormal   Collection Time: 05/14/18  6:30 PM  Result Value Ref Range   WBC 17.5 (H) 4.0 - 10.5 K/uL   RBC 4.99 3.87 - 5.11 MIL/uL   Hemoglobin 13.4 12.0 - 15.0 g/dL   HCT 43.3 36.0 - 46.0 %   MCV 86.8 78.0 - 100.0 fL   MCH 26.9 26.0 - 34.0 pg   MCHC 30.9 30.0 - 36.0 g/dL   RDW 14.6 11.5 - 15.5 %   Platelets 282 150 - 400 K/uL    Comment: Performed at [facility] [address] [place], [place] [address]  Digoxin level     Status: Abnormal   Collection Time: 05/14/18  6:30 PM  Result Value Ref Range   Digoxin Level <0.2 (L) 0.8 - 2.0 ng/mL    Comment: RESULTS CONFIRMED BY MANUAL DILUTION Performed at [facility], [place] [address], [place], [place] [address]   I-stat troponin, ED     Status: None   Collection Time: 05/14/18  6:43 PM  Result Value Ref Range   Troponin i, poc 0.01 0.00 - 0.08 ng/mL   Comment 3            Comment: Due to the release kinetics of cTnI, a negative result within the first hours of the onset of symptoms does not rule out myocardial infarction with certainty. If myocardial infarction is still suspected, repeat the test at appropriate intervals.    Dg Chest Port 1 View  Result Date: 05/14/2018 CLINICAL DATA:  Shortness of breath, chest pain. EXAM: PORTABLE CHEST 1 VIEW COMPARISON:  Radiographs of Dec 26, 2017. FINDINGS: Stable cardiomegaly. No pneumothorax or pleural effusion is noted. Left lung is clear. New airspace opacity is noted in right upper lobe most consistent with pneumonia. Bony thorax is unremarkable. IMPRESSION: New right upper lobe airspace opacity is noted most consistent with pneumonia, although lateral radiograph projection of the chest is recommended for further evaluation and to rule out neoplasm. Electronically Signed   By: Marijo Conception, M.D.   On: 05/14/2018 19:12    Review of Systems   Constitutional: Positive for malaise/fatigue. Negative for chills and fever.  HENT: Positive for hearing loss.   Respiratory: Positive for cough, sputum production and shortness of breath.   Gastrointestinal: Negative for nausea and vomiting.  Genitourinary: Negative for dysuria.  Neurological: Negative for dizziness.    Blood pressure 105/79, pulse (!) 123, resp. rate (!) 30, height '5\' 7"'$  (1.702 m), weight 129.3 kg, SpO2 94 %. Physical Exam  Constitutional: She is oriented to person, place, and time.  HENT:  Head:  Normocephalic.  Eyes: Pupils are equal, round, and reactive to light. Conjunctivae are normal. Left eye exhibits no discharge. No scleral icterus.  Neck: Normal range of motion. Neck supple. No JVD present. No tracheal deviation present. No thyromegaly present.  Cardiovascular:  Tachycardic irregularly irregular S1 and S2 soft  Respiratory:  Decreased breath sound at bases and occasional right lung rhonchi  GI: Soft. Bowel sounds are normal. She exhibits distension. There is no tenderness. There is no rebound.  Musculoskeletal: She exhibits no edema, tenderness or deformity.  Neurological: She is alert and oriented to person, place, and time.     Assessment/Plan Right upper lobe pneumonia A. Fib with RVR chadsvasc  score of 4 on chronic anticoagulation Nonobstructive CAD Hypertension Diabetes mellitus Acute on chronic kidney injury Dehydration Compensated systolic/diastolic congestive heart failure Morbid obesity Obstructive sleep apnea/obesity hypoventilation syndrome History of DVT in the past Depression Degenerative joint disease. Plan As per orders Charolette Forward, MD 05/14/2018, 8:38 PM

## 2018-05-14 NOTE — ED Notes (Signed)
New order for lasix 40mg  vo from Dr Terrence Dupont.

## 2018-05-14 NOTE — ED Notes (Signed)
Patient is diaphoretic with crackles in lungs bilaterally.  Dr Terrence Dupont paged.  Patient placed on Bipap at this time.

## 2018-05-15 LAB — CBC
HCT: 43.6 % (ref 36.0–46.0)
Hemoglobin: 13.3 g/dL (ref 12.0–15.0)
MCH: 27.2 pg (ref 26.0–34.0)
MCHC: 30.5 g/dL (ref 30.0–36.0)
MCV: 89.2 fL (ref 78.0–100.0)
Platelets: 262 10*3/uL (ref 150–400)
RBC: 4.89 MIL/uL (ref 3.87–5.11)
RDW: 14.9 % (ref 11.5–15.5)
WBC: 20.4 10*3/uL — ABNORMAL HIGH (ref 4.0–10.5)

## 2018-05-15 LAB — HEPARIN LEVEL (UNFRACTIONATED)
Heparin Unfractionated: <0.1 IU/mL — ABNORMAL LOW (ref 0.30–0.70)
Heparin Unfractionated: <0.1 IU/mL — ABNORMAL LOW (ref 0.30–0.70)
Heparin Unfractionated: <0.1 IU/mL — ABNORMAL LOW (ref 0.30–0.70)
Heparin Unfractionated: 0.31 IU/mL (ref 0.30–0.70)

## 2018-05-15 LAB — HEMOGLOBIN A1C
Hgb A1c MFr Bld: 9.9 % — ABNORMAL HIGH (ref 4.8–5.6)
Mean Plasma Glucose: 237.43 mg/dL

## 2018-05-15 LAB — RESPIRATORY PANEL BY PCR

## 2018-05-15 LAB — BASIC METABOLIC PANEL
Anion gap: 26 — ABNORMAL HIGH (ref 5–15)
BUN: 33 mg/dL — ABNORMAL HIGH (ref 8–23)
CO2: 14 mmol/L — ABNORMAL LOW (ref 22–32)
Calcium: 9.4 mg/dL (ref 8.9–10.3)
Chloride: 101 mmol/L (ref 98–111)
Creatinine, Ser: 3.27 mg/dL — ABNORMAL HIGH (ref 0.44–1.00)
GFR calc Af Amer: 16 mL/min — ABNORMAL LOW (ref >60)
GFR calc non Af Amer: 14 mL/min — ABNORMAL LOW (ref >60)
Glucose, Bld: 98 mg/dL (ref 70–99)
Potassium: 6.2 mmol/L — ABNORMAL HIGH (ref 3.5–5.1)
Sodium: 141 mmol/L (ref 135–145)

## 2018-05-15 LAB — GLUCOSE, CAPILLARY
Glucose-Capillary: 166 mg/dL — ABNORMAL HIGH (ref 70–99)
Glucose-Capillary: 216 mg/dL — ABNORMAL HIGH (ref 70–99)
Glucose-Capillary: 237 mg/dL — ABNORMAL HIGH (ref 70–99)
Glucose-Capillary: 251 mg/dL — ABNORMAL HIGH (ref 70–99)

## 2018-05-15 LAB — EXPECTORATED SPUTUM ASSESSMENT W GRAM STAIN, RFLX TO RESP C

## 2018-05-15 LAB — INFLUENZA PANEL BY PCR (TYPE A & B)
Influenza A By PCR: NEGATIVE
Influenza B By PCR: NEGATIVE

## 2018-05-15 LAB — MRSA PCR SCREENING: MRSA by PCR: NEGATIVE

## 2018-05-15 LAB — HIV ANTIBODY (ROUTINE TESTING W REFLEX): HIV Screen 4th Generation wRfx: NONREACTIVE

## 2018-05-15 MED ORDER — DIGOXIN 0.25 MG/ML IJ SOLN
0.2500 mg | Freq: Once | INTRAMUSCULAR | Status: AC
  Administered 2018-05-15: 0.25 mg via INTRAVENOUS
  Filled 2018-05-15: qty 2

## 2018-05-15 MED ORDER — SODIUM POLYSTYRENE SULFONATE 15 GM/60ML PO SUSP
30.0000 g | Freq: Once | ORAL | Status: DC
  Filled 2018-05-15: qty 120

## 2018-05-15 MED ORDER — SODIUM POLYSTYRENE SULFONATE PO POWD
30.0000 g | Freq: Once | ORAL | Status: AC
  Administered 2018-05-15: 30 g via ORAL
  Filled 2018-05-15: qty 30

## 2018-05-15 MED ORDER — METOPROLOL TARTRATE 5 MG/5ML IV SOLN
5.0000 mg | Freq: Once | INTRAVENOUS | Status: AC
  Administered 2018-05-15: 5 mg via INTRAVENOUS
  Filled 2018-05-15: qty 5

## 2018-05-15 MED ORDER — SORBITOL 70 % SOLN
30.0000 mL | Freq: Once | Status: AC
  Administered 2018-05-15: 30 mL via ORAL
  Filled 2018-05-15: qty 30

## 2018-05-15 MED ORDER — SODIUM CHLORIDE 0.9 % IV BOLUS
250.0000 mL | Freq: Once | INTRAVENOUS | Status: AC
  Administered 2018-05-15: 250 mL via INTRAVENOUS

## 2018-05-15 MED ORDER — METOPROLOL TARTRATE 25 MG PO TABS
25.0000 mg | ORAL_TABLET | Freq: Three times a day (TID) | ORAL | Status: DC
  Administered 2018-05-16 – 2018-05-18 (×9): 25 mg via ORAL
  Filled 2018-05-15 (×10): qty 1

## 2018-05-15 MED ORDER — ONDANSETRON HCL 4 MG/2ML IJ SOLN
4.0000 mg | Freq: Four times a day (QID) | INTRAMUSCULAR | Status: DC | PRN
  Administered 2018-05-14 – 2018-05-18 (×4): 4 mg via INTRAVENOUS
  Filled 2018-05-15 (×4): qty 2

## 2018-05-15 MED ORDER — METOPROLOL TARTRATE 5 MG/5ML IV SOLN
2.5000 mg | Freq: Four times a day (QID) | INTRAVENOUS | Status: DC | PRN
  Administered 2018-05-16 (×2): 2.5 mg via INTRAVENOUS
  Filled 2018-05-15 (×3): qty 5

## 2018-05-15 NOTE — Progress Notes (Signed)
Patient tolerating Bi-pap well, alert, SBP in the low 105s, HR sustaining at 95-110.  I will keep monitoring patient.

## 2018-05-15 NOTE — Progress Notes (Signed)
ANTICOAGULATION CONSULT NOTE - Follow Up Consult  Pharmacy Consult for heparin  Indication: atrial fibrillation  Allergies  Allergen Reactions  . Nsaids Other (See Comments)    On warfarin  . Sulfa Antibiotics Itching    Patient Measurements: Height: 5\' 7"  (170.2 cm) Weight: (!) 328 lb 11.3 oz (149.1 kg) IBW/kg (Calculated) : 61.6 Heparin Dosing Weight: 92.7  Vital Signs: Temp: 98.6 F (37 C) (09/25 0731) Temp Source: Axillary (09/25 0731) BP: 126/88 (09/25 0731) Pulse Rate: 122 (09/25 0731)  Labs: Recent Labs    05/14/18 1830 05/14/18 2356 05/14/18 2357 05/15/18 0656  HGB 13.4  --  13.3  --   HCT 43.3  --  43.6  --   PLT 282  --  262  --   HEPARINUNFRC  --  <0.10*  --  <0.10*  CREATININE 3.19*  --  3.27*  --     Estimated Creatinine Clearance: 27.6 mL/min (A) (by C-G formula based on SCr of 3.27 mg/dL (H)).   Assessment: 62 yo F admitted with SOB found to be in Afib with RVR. Heparin started for anticoagulation. CBC WNL. Pt on pradaxa PTA but is unsure of when the last time she took her medications.   Heparin level undetectable this morning, patient had IV issues late last night but they appear to be resolved. Will increase heparin rate and recheck this afternoon.   Goal of Therapy:  Heparin level 0.3-0.7 units/ml Monitor platelets by anticoagulation protocol: Yes   Plan:  Increase heparin to 1800 unit/hr  6 hr heparin level Daily heparin level, CBC  Erin Hearing PharmD., BCPS Clinical Pharmacist 05/15/2018 8:48 AM

## 2018-05-15 NOTE — Plan of Care (Signed)
  Problem: Clinical Measurements: Goal: Respiratory complications will improve Outcome: Progressing   

## 2018-05-15 NOTE — Progress Notes (Signed)
Subjective:  Patient is off BiPAP, tolerating nasal cannula and sats above 92%.  Stated his breathing has improved remains in A. Fib with moderate ventricular response IV access was lost in origin.  This morning, being reinserted.  Objective:  Vital Signs in the last 24 hours: Temp:  [97.5 F (36.4 C)-98.6 F (37 C)] 98.6 F (37 C) (09/25 0731) Pulse Rate:  [29-182] 122 (09/25 0731) Resp:  [17-40] 30 (09/25 0731) BP: (88-160)/(66-108) 126/88 (09/25 0731) SpO2:  [60 %-100 %] 100 % (09/25 0731) FiO2 (%):  [50 %] 50 % (09/25 0731) Weight:  [129.3 kg-149.1 kg] 149.1 kg (09/24 2352)  Intake/Output from previous day: 09/24 0701 - 09/25 0700 In: 1238 [I.V.:1238] Out: -  Intake/Output from this shift: Total I/O In: 360 [P.O.:360] Out: 700 [Urine:700]  Physical Exam: Neck: no adenopathy, no carotid bruit, no JVD and supple, symmetrical, trachea midline Lungs: right lung rhonchi noted, otherwise decreased breath sounds at bases Heart: irregularly irregular rhythm, S1, S2 normal and soft systolic murmur noted Abdomen: soft, non-tender; bowel sounds normal; no masses,  no organomegaly Extremities: extremities normal, atraumatic, no cyanosis or edema  Lab Results: Recent Labs    05/14/18 1830 05/14/18 2357  WBC 17.5* 20.4*  HGB 13.4 13.3  PLT 282 262   Recent Labs    05/14/18 1830 05/14/18 2357  NA 139 141  K 5.3* 6.2*  CL 99 101  CO2 18* 14*  GLUCOSE 118* 98  BUN 29* 33*  CREATININE 3.19* 3.27*   No results for input(s): TROPONINI in the last 72 hours.  Invalid input(s): CK, MB Hepatic Function Panel No results for input(s): PROT, ALBUMIN, AST, ALT, ALKPHOS, BILITOT, BILIDIR, IBILI in the last 72 hours. No results for input(s): CHOL in the last 72 hours. No results for input(s): PROTIME in the last 72 hours.  Imaging: Imaging results have been reviewed and Dg Chest Port 1 View  Result Date: 05/14/2018 CLINICAL DATA:  Shortness of breath, chest pain. EXAM: PORTABLE  CHEST 1 VIEW COMPARISON:  Radiographs of Dec 26, 2017. FINDINGS: Stable cardiomegaly. No pneumothorax or pleural effusion is noted. Left lung is clear. New airspace opacity is noted in right upper lobe most consistent with pneumonia. Bony thorax is unremarkable. IMPRESSION: New right upper lobe airspace opacity is noted most consistent with pneumonia, although lateral radiograph projection of the chest is recommended for further evaluation and to rule out neoplasm. Electronically Signed   By: Marijo Conception, M.D.   On: 05/14/2018 19:12    Cardiac Studies:  Assessment/Plan:  Right upper lobe pneumonia A. Fib with RVR chadsvasc  score of 4 on chronic anticoagulation Nonobstructive CAD Hypertension Diabetes mellitus Acute on chronic kidney injury multifactorialIE dehydration/ARB/hypotension Dehydration Compensated systolic/diastolic congestive heart failure Morbid obesity Obstructive sleep apnea/obesity hypoventilation syndrome History of DVT in the past Depression Degenerative joint disease. Hyperkalemia  Plan Kayexalate with sorbitol as per orders. Labs in a.m. Will repeat x-ray in a few days  LOS: 1 day    Charolette Forward 05/15/2018, 11:38 AM

## 2018-05-15 NOTE — Progress Notes (Addendum)
ANTICOAGULATION CONSULT NOTE - Follow Up Consult  Pharmacy Consult for heparin  Indication: atrial fibrillation  Allergies  Allergen Reactions  . Nsaids Other (See Comments)    On warfarin  . Sulfa Antibiotics Itching    Patient Measurements: Height: 5\' 7"  (170.2 cm) Weight: (!) 328 lb 11.3 oz (149.1 kg) IBW/kg (Calculated) : 61.6 Heparin Dosing Weight: 92.7  Vital Signs: Temp: 100.3 F (37.9 C) (09/25 1607) Temp Source: Axillary (09/25 1607) BP: 99/71 (09/25 1607) Pulse Rate: 129 (09/25 1607)  Labs: Recent Labs    05/14/18 1830 05/14/18 2356 05/14/18 2357 05/15/18 0656 05/15/18 1538  HGB 13.4  --  13.3  --   --   HCT 43.3  --  43.6  --   --   PLT 282  --  262  --   --   HEPARINUNFRC  --  <0.10*  --  <0.10* <0.10*  CREATININE 3.19*  --  3.27*  --   --     Estimated Creatinine Clearance: 27.6 mL/min (A) (by C-G formula based on SCr of 3.27 mg/dL (H)).   Assessment: 62 yo F admitted with SOB found to be in Afib with RVR. Heparin started for anticoagulation. CBC WNL. Pt on pradaxa PTA but is unsure of when the last time she took her medications.  -heparin level remains undetectable on 1800 units/hr (with noted 149kg) -per RN, heparin was off from ~ 10am-12pm (line occlusion leading to new line placement)    Goal of Therapy:  Heparin level 0.3-0.7 units/ml Monitor platelets by anticoagulation protocol: Yes   Plan:  Increase heparin to 2100 unit/hr  6 hr heparin level Daily heparin level, CBC  Hildred Laser, PharmD Clinical Pharmacist Please check Amion for pharmacy contact number

## 2018-05-15 NOTE — Progress Notes (Signed)
Patient's HR varies up to the 140s, Dr. Terrence Dupont ordered 2nd dose of Digoxin which was given to patient.  HR still in high in the 130s and the low 120s.  BP too low, unable to give metropolo.  Paged Dr. Terrence Dupont for new orders.  Dr. Terrence Dupont called back, wants to start patient on Dopamine to increase BP, checked with charge nurse and Dopamine cannot be given on this floor.  Doctor order 250 bolus of NS and to be given metropolol 5 mg. IV as well. I will keep monitoring the patient.

## 2018-05-15 NOTE — Progress Notes (Signed)
ANTICOAGULATION CONSULT NOTE - Follow Up Consult  Pharmacy Consult for Heparin  Indication: atrial fibrillation  Allergies  Allergen Reactions  . Nsaids Other (See Comments)    On warfarin  . Sulfa Antibiotics Itching    Patient Measurements: Height: 5\' 7"  (170.2 cm) Weight: (!) 328 lb 11.3 oz (149.1 kg) IBW/kg (Calculated) : 61.6 Heparin Dosing Weight: 92.7  Vital Signs: Temp: 100.3 F (37.9 C) (09/25 1607) Temp Source: Axillary (09/25 1607) BP: 99/71 (09/25 1607) Pulse Rate: 129 (09/25 1607)  Labs: Recent Labs    05/14/18 1830  05/14/18 2357 05/15/18 0656 05/15/18 1538 05/15/18 2255  HGB 13.4  --  13.3  --   --   --   HCT 43.3  --  43.6  --   --   --   PLT 282  --  262  --   --   --   HEPARINUNFRC  --    < >  --  <0.10* <0.10* 0.31  CREATININE 3.19*  --  3.27*  --   --   --    < > = values in this interval not displayed.    Estimated Creatinine Clearance: 27.6 mL/min (A) (by C-G formula based on SCr of 3.27 mg/dL (H)).   Assessment: 62 yo F admitted with SOB found to be in Afib with RVR. Heparin started for anticoagulation. CBC WNL. Pt on pradaxa PTA but is unsure of when the last time she took her medications.  -Heparin level on low end of therapeutic range tonight after a rate increase  Goal of Therapy:  Heparin level 0.3-0.7 units/ml Monitor platelets by anticoagulation protocol: Yes   Plan:  Increase heparin to 2200 unit/hr  Confirmatory heparin level with AM labs Daily heparin level, CBC  Narda Bonds, PharmD, BCPS Clinical Pharmacist Phone: [phone]

## 2018-05-15 NOTE — Progress Notes (Addendum)
Nutrition Brief Note  Patient identified on the </= 12 braden score report.  Wt Readings from Last 15 Encounters:  05/14/18 (!) 149.1 kg  01/01/18 (!) 136.4 kg  04/26/17 (!) 137.4 kg  01/28/17 136.1 kg  07/25/16 (!) 149.7 kg  04/12/16 (!) 150 kg  03/05/15 133.8 kg  10/25/14 (!) 139.3 kg  01/08/14 (!) 155.9 kg  06/24/13 (!) 178 kg  05/30/13 (!) 178.7 kg  03/26/13 (!) 175.3 kg  01/21/13 (!) 165.9 kg  09/06/12 (!) 181.5 kg  06/24/12 (!) 175.5 kg   Body mass index is 51.48 kg/m. Patient meets criteria for Obesity Class III based on current BMI.   Current diet order is Heart Healthy/Carbohydrate Modified. No skin breakdown identified.   Labs and medications reviewed. CBG 166. No nutrition interventions warranted at this time.  If nutrition issues arise, please consult RD.   Arthur Holms, RD, LDN Pager #: [phone] After-Hours Pager #: [phone]

## 2018-05-16 ENCOUNTER — Encounter (HOSPITAL_COMMUNITY): Payer: Self-pay | Admitting: General Practice

## 2018-05-16 ENCOUNTER — Other Ambulatory Visit: Payer: Self-pay

## 2018-05-16 LAB — CBC
HCT: 33.2 % — ABNORMAL LOW (ref 36.0–46.0)
Hemoglobin: 10.8 g/dL — ABNORMAL LOW (ref 12.0–15.0)
MCH: 27.3 pg (ref 26.0–34.0)
MCHC: 32.5 g/dL (ref 30.0–36.0)
MCV: 83.8 fL (ref 78.0–100.0)
Platelets: 210 10*3/uL (ref 150–400)
RBC: 3.96 MIL/uL (ref 3.87–5.11)
RDW: 14.5 % (ref 11.5–15.5)
WBC: 16.6 10*3/uL — ABNORMAL HIGH (ref 4.0–10.5)

## 2018-05-16 LAB — BASIC METABOLIC PANEL
Anion gap: 8 (ref 5–15)
BUN: 40 mg/dL — ABNORMAL HIGH (ref 8–23)
CO2: 26 mmol/L (ref 22–32)
Calcium: 8.5 mg/dL — ABNORMAL LOW (ref 8.9–10.3)
Chloride: 104 mmol/L (ref 98–111)
Creatinine, Ser: 2.34 mg/dL — ABNORMAL HIGH (ref 0.44–1.00)
GFR calc Af Amer: 25 mL/min — ABNORMAL LOW (ref >60)
GFR calc non Af Amer: 21 mL/min — ABNORMAL LOW (ref >60)
Glucose, Bld: 175 mg/dL — ABNORMAL HIGH (ref 70–99)
Potassium: 3.5 mmol/L (ref 3.5–5.1)
Sodium: 138 mmol/L (ref 135–145)

## 2018-05-16 LAB — HEPARIN LEVEL (UNFRACTIONATED): Heparin Unfractionated: 0.34 IU/mL (ref 0.30–0.70)

## 2018-05-16 LAB — GLUCOSE, CAPILLARY
Glucose-Capillary: 110 mg/dL — ABNORMAL HIGH (ref 70–99)
Glucose-Capillary: 146 mg/dL — ABNORMAL HIGH (ref 70–99)
Glucose-Capillary: 301 mg/dL — ABNORMAL HIGH (ref 70–99)
Glucose-Capillary: 196 mg/dL — ABNORMAL HIGH (ref 70–99)

## 2018-05-16 MED ORDER — ENSURE ENLIVE PO LIQD
237.0000 mL | Freq: Two times a day (BID) | ORAL | Status: DC
  Administered 2018-05-16 – 2018-05-18 (×3): 237 mL via ORAL

## 2018-05-16 MED ORDER — ACETAMINOPHEN 325 MG PO TABS
650.0000 mg | ORAL_TABLET | Freq: Four times a day (QID) | ORAL | Status: DC | PRN
  Administered 2018-05-16 – 2018-05-19 (×5): 650 mg via ORAL
  Filled 2018-05-16 (×5): qty 2

## 2018-05-16 MED ORDER — APIXABAN 5 MG PO TABS
5.0000 mg | ORAL_TABLET | Freq: Two times a day (BID) | ORAL | Status: DC
  Administered 2018-05-16 – 2018-05-20 (×9): 5 mg via ORAL
  Filled 2018-05-16 (×9): qty 1

## 2018-05-16 MED ORDER — METOPROLOL TARTRATE 5 MG/5ML IV SOLN
5.0000 mg | Freq: Once | INTRAVENOUS | Status: AC
  Administered 2018-05-16: 5 mg via INTRAVENOUS
  Filled 2018-05-16: qty 5

## 2018-05-16 MED ORDER — DILTIAZEM HCL 60 MG PO TABS
30.0000 mg | ORAL_TABLET | Freq: Four times a day (QID) | ORAL | Status: DC
  Administered 2018-05-16 – 2018-05-19 (×12): 30 mg via ORAL
  Filled 2018-05-16 (×12): qty 1

## 2018-05-16 NOTE — Progress Notes (Signed)
Placed patient on Bipap for the night.

## 2018-05-16 NOTE — Progress Notes (Signed)
Subjective:  Patient complains of generalized body ache.  States coughing and breathing has improved.  He remains in A. Fib with RVR.  Did not receive by mouth Lopressor yesterday because of low blood pressure  Objective:  Vital Signs in the last 24 hours: Temp:  [97.5 F (36.4 C)-100.3 F (37.9 C)] 97.5 F (36.4 C) (09/26 0738) Pulse Rate:  [82-150] 136 (09/26 0947) Resp:  [20] 20 (09/25 2350) BP: (93-115)/(55-83) 100/55 (09/26 0947) SpO2:  [91 %-100 %] 97 % (09/26 0955) Weight:  [150.6 kg] 150.6 kg (09/26 0616)  Intake/Output from previous day: 09/25 0701 - 09/26 0700 In: 3557 [P.O.:840; I.V.:213; IV Piggyback:600] Out: 1250 [Urine:1250] Intake/Output from this shift: Total I/O In: 61.6 [I.V.:61.6] Out: -   Physical Exam: Neck: no adenopathy, no carotid bruit, no JVD and supple, symmetrical, trachea midline Lungs: right lung rhonchi noted Heart: irregularly irregular rhythm, S1, S2 normal and soft systolic murmur noted Abdomen: soft, non-tender; bowel sounds normal; no masses,  no organomegaly Extremities: extremities normal, atraumatic, no cyanosis or edema  Lab Results: Recent Labs    05/14/18 2357 05/16/18 0402  WBC 20.4* 16.6*  HGB 13.3 10.8*  PLT 262 210   Recent Labs    05/14/18 2357 05/16/18 0402  NA 141 138  K 6.2* 3.5  CL 101 104  CO2 14* 26  GLUCOSE 98 175*  BUN 33* 40*  CREATININE 3.27* 2.34*   No results for input(s): TROPONINI in the last 72 hours.  Invalid input(s): CK, MB Hepatic Function Panel No results for input(s): PROT, ALBUMIN, AST, ALT, ALKPHOS, BILITOT, BILIDIR, IBILI in the last 72 hours. No results for input(s): CHOL in the last 72 hours. No results for input(s): PROTIME in the last 72 hours.  Imaging: Imaging results have been reviewed and Dg Chest Port 1 View  Result Date: 05/14/2018 CLINICAL DATA:  Shortness of breath, chest pain. EXAM: PORTABLE CHEST 1 VIEW COMPARISON:  Radiographs of Dec 26, 2017. FINDINGS: Stable  cardiomegaly. No pneumothorax or pleural effusion is noted. Left lung is clear. New airspace opacity is noted in right upper lobe most consistent with pneumonia. Bony thorax is unremarkable. IMPRESSION: New right upper lobe airspace opacity is noted most consistent with pneumonia, although lateral radiograph projection of the chest is recommended for further evaluation and to rule out neoplasm. Electronically Signed   By: Marijo Conception, M.D.   On: 05/14/2018 19:12    Cardiac Studies:  Assessment/Plan:  Right upper lobe pneumonia A. Fib with RVR chadsvasc score of 4 on chronic anticoagulation Nonobstructive CAD Hypertension Diabetes mellitus Acute on chronic kidney injury multifactorialIE dehydration/ARB/hypotension improved Dehydration Compensated systolic/diastolic congestive heart failure Morbid obesity Obstructive sleep apnea/obesity hypoventilation syndrome History of DVT in the past Depression Degenerative joint disease. Status post hyperkalemia acute anemia, probably secondary to hydration.  Rule out GI loss Plan Add low-dose Cardizem as per orders. Switch IV heparin to Eliquis OT, PT consult Check stool for occult blood  LOS: 2 days    Charolette Forward 05/16/2018, 11:13 AM

## 2018-05-16 NOTE — Care Management (Signed)
05-16-18  BENEFITS CHECK :  # 4.  S/W DORIS @ [employer] RX # [phone]  APIXABAN - NONE FORMULARY  ELIQUIS  5 MG BID COVER- YES CO-PAY- not  available TIER- 3 DRUG PRIOR APPROVAL- YES # [phone] FOR EXCEPTION  PREFERRED PHARMACY ;  YES [facility], [facility] AND  [facility]

## 2018-05-16 NOTE — Progress Notes (Signed)
Patient's HR in the low 110-120s to low 130s.  SBP has gone down to mid 80s.  I will keep monitoring patient.

## 2018-05-16 NOTE — Progress Notes (Signed)
Patient was given 2nd dose of metropolol tonight, HR in the low 110-120s. SBP has improved up in the 115s.  I will keep monitoring the patient.

## 2018-05-16 NOTE — Progress Notes (Signed)
ANTICOAGULATION CONSULT NOTE - Follow Up Consult  Pharmacy Consult for Heparin>>apixaban Indication: atrial fibrillation  Allergies  Allergen Reactions  . Nsaids Other (See Comments)    On warfarin  . Sulfa Antibiotics Itching    Patient Measurements: Height: 5\' 7"  (170.2 cm) Weight: (!) 332 lb 0.2 oz (150.6 kg) IBW/kg (Calculated) : 61.6 Heparin Dosing Weight: 92.7  Vital Signs: Temp: 97.5 F (36.4 C) (09/26 0738) Temp Source: Oral (09/26 0738) BP: 96/74 (09/26 1107) Pulse Rate: 136 (09/26 0947)  Labs: Recent Labs    05/14/18 1830  05/14/18 2357  05/15/18 1538 05/15/18 2255 05/16/18 0402  HGB 13.4  --  13.3  --   --   --  10.8*  HCT 43.3  --  43.6  --   --   --  33.2*  PLT 282  --  262  --   --   --  210  HEPARINUNFRC  --    < >  --    < > <0.10* 0.31 0.34  CREATININE 3.19*  --  3.27*  --   --   --  2.34*   < > = values in this interval not displayed.    Estimated Creatinine Clearance: 38.7 mL/min (A) (by C-G formula based on SCr of 2.34 mg/dL (H)).   Assessment: 62 yo F admitted with SOB found to be in Afib with RVR. Heparin started for anticoagulation. CBC WNL. Pt on pradaxa PTA but is unsure of when the last time she took her medications.    New orders to transition patient off heparin to apixaban. Patient does have an elevated scr at 2.34 (down today) but does not qualify for dose adjustment.   Hgb down from 13>10, MD believes likely hydration but will continue to follow cbc for now and watch for s/s of bleeding.   Goal of Therapy:  Heparin level 0.3-0.7 units/ml Monitor platelets by anticoagulation protocol: Yes   Plan:  Stop heparin this am and give apixaban 5mg  bid Will consult case management given new medication (she was on pradaxa prior to admit) agree that apixaban would be a better option given renal dysfunction.   Erin Hearing PharmD., BCPS Clinical Pharmacist 05/16/2018 11:38 AM

## 2018-05-17 LAB — GLUCOSE, CAPILLARY
Glucose-Capillary: 133 mg/dL — ABNORMAL HIGH (ref 70–99)
Glucose-Capillary: 215 mg/dL — ABNORMAL HIGH (ref 70–99)
Glucose-Capillary: 157 mg/dL — ABNORMAL HIGH (ref 70–99)
Glucose-Capillary: 245 mg/dL — ABNORMAL HIGH (ref 70–99)

## 2018-05-17 LAB — CBC
HCT: 36.5 % (ref 36.0–46.0)
Hemoglobin: 11.6 g/dL — ABNORMAL LOW (ref 12.0–15.0)
MCH: 27.4 pg (ref 26.0–34.0)
MCHC: 31.8 g/dL (ref 30.0–36.0)
MCV: 86.3 fL (ref 78.0–100.0)
Platelets: 197 10*3/uL (ref 150–400)
RBC: 4.23 MIL/uL (ref 3.87–5.11)
RDW: 15.1 % (ref 11.5–15.5)
WBC: 11.4 10*3/uL — ABNORMAL HIGH (ref 4.0–10.5)

## 2018-05-17 LAB — BASIC METABOLIC PANEL
Anion gap: 10 (ref 5–15)
BUN: 26 mg/dL — ABNORMAL HIGH (ref 8–23)
CO2: 22 mmol/L (ref 22–32)
Calcium: 8.6 mg/dL — ABNORMAL LOW (ref 8.9–10.3)
Chloride: 105 mmol/L (ref 98–111)
Creatinine, Ser: 1.66 mg/dL — ABNORMAL HIGH (ref 0.44–1.00)
GFR calc Af Amer: 37 mL/min — ABNORMAL LOW (ref >60)
GFR calc non Af Amer: 32 mL/min — ABNORMAL LOW (ref >60)
Glucose, Bld: 131 mg/dL — ABNORMAL HIGH (ref 70–99)
Potassium: 3.7 mmol/L (ref 3.5–5.1)
Sodium: 137 mmol/L (ref 135–145)

## 2018-05-17 MED ORDER — GABAPENTIN 100 MG PO CAPS
100.0000 mg | ORAL_CAPSULE | Freq: Two times a day (BID) | ORAL | Status: DC
  Administered 2018-05-17 – 2018-05-20 (×7): 100 mg via ORAL
  Filled 2018-05-17 (×7): qty 1

## 2018-05-17 MED ORDER — LEVALBUTEROL HCL 1.25 MG/0.5ML IN NEBU
1.2500 mg | INHALATION_SOLUTION | Freq: Three times a day (TID) | RESPIRATORY_TRACT | Status: DC
  Administered 2018-05-18 – 2018-05-20 (×6): 1.25 mg via RESPIRATORY_TRACT
  Filled 2018-05-17 (×8): qty 0.5

## 2018-05-17 NOTE — Progress Notes (Addendum)
11:30 Noted CM handoff stating "following for [facility] to deliver meds to patient."  Spoke w 6E pharmacist Mitzi Hansen who will follow up with Dr Terrence Dupont re scripts for DC and Eliquis prior auth.  [facility] is closed Friday after business hours and will not reopen until Monday.  If scripts are not delivered to bed today and patient DC's over weekend, she will need to fill them at outside pharmacy after DC. She will also need 30 day Eliquis card.  15:00 Spoke w pharmacist, Mitzi Hansen. He states Eliquis was run through [facility] and patient does not have co-pay, nor was prior auth needed. He was unclear at this time if meds will be filled through [facility] prior to DC. Patient has coverage and can get meds filled after DC if needed.

## 2018-05-17 NOTE — Care Management Important Message (Signed)
Important Message  Patient Details  Name: [name] MRN: [id number] Date of Birth: [birth year]   Medicare Important Message Given:  Yes    Barb Merino Tationa Stech 05/17/2018, 4:15 PM

## 2018-05-17 NOTE — Progress Notes (Signed)
Pharmacy note:    Pharmacy is working to supply apixaban 48m po bid and could also supply diltiazem.  Patient will not be discharged today but has someone who could take the medication home today.  -Will utilize 30 day free cardi -Cost after 30 days per TOC phamacy: $0 (cost may differ later if a deductible needs to be met)    Plan -MD will consider sending apixaban to the [facility]-If medications are not filled here, patient will need to get these at her usual pharmacy  AHildred Laser PharmD Clinical Pharmacist Please check Amion for pharmacy contact number

## 2018-05-17 NOTE — Progress Notes (Signed)
Inpatient Diabetes Program Recommendations  AACE/ADA: New Consensus Statement on Inpatient Glycemic Control (2015)  Target Ranges:  Prepandial:   less than 140 mg/dL      Peak postprandial:   less than 180 mg/dL (1-2 hours)      Critically ill patients:  140 - 180 mg/dL   Lab Results  Component Value Date   GLUCAP 157 (H) 05/17/2018   HGBA1C 9.9 (H) 05/14/2018    Review of Glycemic Control  Diabetes history: DM2 Outpatient Diabetes medications: Levemir 35 units QD, Novolog 25 units bid, Januvia 50 units QD Current orders for Inpatient glycemic control: Levemir 10 units QHS, Novolog 0-9 units tidwc  Spoke with pt regarding HgbA1C of 9.9%. Pt somewhat confused about how much of Levemir and Novolog she was taking at home. States she does not have hypoglycemia. States she checks blood sugars but not on regular basis.  Inpatient Diabetes Program Recommendations:     Add Novolog 3 units tidwc for meal coverage insulin.  Instructed pt to check blood sugars more frequently and take insulin as prescribed. Encouraged pt to f/u with PCP for management of her diabetes. Answered questions. Will continue to follow for needs.  Thank you. Lorenda Peck, RD, LDN, CDE Inpatient Diabetes Coordinator [phone]

## 2018-05-17 NOTE — Evaluation (Signed)
Physical Therapy Evaluation Patient Details Name: [name] MRN: [id number] DOB: 1956/01/10 Today's Date: 05/17/2018   History of Present Illness  Patient is 62 year old female with past medical history significant for hypertension, type 2 diabetes mellitus, chronic atrial fibrillation on chronic anticoagulation, nonobstructive CAD, COPD, obstructive sleep apnea/obesity hypoventilation syndrome, history of congestive heart failure secondary to systolic/diastolic dysfunction, chronic kidney disease stage III, history of DVT in the past, depression, and degenerative joint disease, morbid obesity, positive family history of coronary artery disease, came to the ER complaining of cough associated with progressive increasing shortness of breath.  Patient found to be in A. Fib with RVR and x-ray showed new right upper lobe infiltrate.   Clinical Impression  Patient presents with decreased independence with mobility due to weakness, prolonged bedrest and acute illness.  Currently she is min A for in room ambulation, but previously able to manage at home alone during the day as daughter and grandchildren work or go to school.  Feel she will benefit from skilled PT in the acute setting to allow return home with family support (states she will call her sister to assist) and follow up HHPT.     Follow Up Recommendations Home health PT;Supervision/Assistance - 24 hour    Equipment Recommendations  None recommended by PT    Recommendations for Other Services       Precautions / Restrictions Precautions Precautions: Fall Precaution Comments: watch HR      Mobility  Bed Mobility Overal bed mobility: Needs Assistance Bed Mobility: Supine to Sit     Supine to sit: Min assist     General bed mobility comments: to lift trunk, use of rail and HOB elevated some  Transfers Overall transfer level: Needs assistance Equipment used: Rolling walker (2 wheeled) Transfers: Sit to/from Stand Sit  to Stand: Min assist         General transfer comment: up from EOB and from low toilet with grabbar  Ambulation/Gait Ambulation/Gait assistance: Min assist;Min guard Gait Distance (Feet): 20 Feet(& 10') Assistive device: Rolling walker (2 wheeled) Gait Pattern/deviations: Step-through pattern;Decreased stride length;Shuffle;Trunk flexed;Wide base of support     General Gait Details: increased time, but able to walk to bathroom and around bed to recliner.  Assist for safety/balance  Stairs            Wheelchair Mobility    Modified Rankin (Stroke Patients Only)       Balance Overall balance assessment: Needs assistance   Sitting balance-Leahy Scale: Good     Standing balance support: Bilateral upper extremity supported Standing balance-Leahy Scale: Poor Standing balance comment: Heavy UE support                             Pertinent Vitals/Pain Pain Assessment: 0-10 Pain Score: 8  Pain Location: thighs, knees legs and feet Pain Descriptors / Indicators: Aching Pain Intervention(s): Monitored during session;Repositioned    Home Living Family/patient expects to be discharged to:: Private residence Living Arrangements: Children Available Help at Discharge: Family;Available PRN/intermittently Type of Home: Apartment Home Access: Stairs to enter Entrance Stairs-Rails: Left Entrance Stairs-Number of Steps: 3 Home Layout: One level Home Equipment: Walker - 2 wheels;Shower seat;Cane - single point Additional Comments: states walker has a seat, has home O2 as needed    Prior Function Level of Independence: Needs assistance   Gait / Transfers Assistance Needed: walks with cane usually  ADL's / Homemaking Assistance Needed: supervised by daughter or grandaughter in  shower  Comments: fell Tuesday prior to admission     Hand Dominance   Dominant Hand: Right    Extremity/Trunk Assessment   Upper Extremity Assessment Upper Extremity Assessment:  Generalized weakness    Lower Extremity Assessment Lower Extremity Assessment: Generalized weakness       Communication   Communication: No difficulties  Cognition Arousal/Alertness: Awake/alert Behavior During Therapy: WFL for tasks assessed/performed Overall Cognitive Status: Within Functional Limits for tasks assessed                                        General Comments      Exercises     Assessment/Plan    PT Assessment Patient needs continued PT services  PT Problem List Decreased strength;Decreased mobility;Decreased balance;Decreased knowledge of use of DME;Decreased activity tolerance;Cardiopulmonary status limiting activity       PT Treatment Interventions DME instruction;Therapeutic activities;Therapeutic exercise;Gait training;Patient/family education;Stair training;Functional mobility training    PT Goals (Current goals can be found in the Care Plan section)  Acute Rehab PT Goals Patient Stated Goal: wants to go home PT Goal Formulation: With patient Time For Goal Achievement: 05/24/18 Potential to Achieve Goals: Good    Frequency Min 3X/week   Barriers to discharge        Co-evaluation               AM-PAC PT "6 Clicks" Daily Activity  Outcome Measure Difficulty turning over in bed (including adjusting bedclothes, sheets and blankets)?: A Lot Difficulty moving from lying on back to sitting on the side of the bed? : Unable Difficulty sitting down on and standing up from a chair with arms (e.g., wheelchair, bedside commode, etc,.)?: Unable Help needed moving to and from a bed to chair (including a wheelchair)?: A Little Help needed walking in hospital room?: A Lot Help needed climbing 3-5 steps with a railing? : A Lot 6 Click Score: 11    End of Session Equipment Utilized During Treatment: Gait belt;Oxygen Activity Tolerance: Patient limited by fatigue Patient left: in chair;with call bell/phone within reach Nurse  Communication: Mobility status PT Visit Diagnosis: Other abnormalities of gait and mobility (R26.89);Muscle weakness (generalized) (M62.81);History of falling (Z91.81)    Time: 1694-5038 PT Time Calculation (min) (ACUTE ONLY): 36 min   Charges:   PT Evaluation $PT Eval Moderate Complexity: 1 Mod PT Treatments $Gait Training: 8-22 mins        Magda Kiel, [place] Acute [facility] Services [phone] 05/17/2018   Reginia Naas 05/17/2018, 4:43 PM

## 2018-05-17 NOTE — Progress Notes (Signed)
Subjective:  Patient denies any chest pain.  States breathing is gradually improving.  Also complains of tingling numbness in the legs.renal function gradually improving  Objective:  Vital Signs in the last 24 hours: Temp:  [97.6 F (36.4 C)-98.1 F (36.7 C)] 97.6 F (36.4 C) (09/27 0753) Pulse Rate:  [68-130] 68 (09/27 0753) Resp:  [18-26] 20 (09/27 0753) BP: (99-117)/(63-96) 106/78 (09/27 1233) SpO2:  [92 %-100 %] 97 % (09/27 1234) FiO2 (%):  [40 %] 40 % (09/27 0521) Weight:  [150.9 kg] 150.9 kg (09/27 0521)  Intake/Output from previous day: 09/26 0701 - 09/27 0700 In: 1521.2 [P.O.:940; I.V.:581.2] Out: 1400 [Urine:1400] Intake/Output from this shift: Total I/O In: 240 [P.O.:240] Out: -   Physical Exam: Neck: no adenopathy, no carotid bruit, no JVD and supple, symmetrical, trachea midline Lungs: decreased breath sounds at bases with right lung.  Occasional rhonchi.  Air entry is improved Heart: irregularly irregular rhythm, S1, S2 normal and soft systolic murmur noted Abdomen: soft, non-tender; bowel sounds normal; no masses,  no organomegaly Extremities: extremities normal, atraumatic, no cyanosis or edema  Lab Results: Recent Labs    05/16/18 0402 05/17/18 0404  WBC 16.6* 11.4*  HGB 10.8* 11.6*  PLT 210 197   Recent Labs    05/16/18 0402 05/17/18 0404  NA 138 137  K 3.5 3.7  CL 104 105  CO2 26 22  GLUCOSE 175* 131*  BUN 40* 26*  CREATININE 2.34* 1.66*   No results for input(s): TROPONINI in the last 72 hours.  Invalid input(s): CK, MB Hepatic Function Panel No results for input(s): PROT, ALBUMIN, AST, ALT, ALKPHOS, BILITOT, BILIDIR, IBILI in the last 72 hours. No results for input(s): CHOL in the last 72 hours. No results for input(s): PROTIME in the last 72 hours.  Imaging: Imaging results have been reviewed and No results found.  Cardiac Studies:  Assessment/Plan:  Resolving Right upper lobe pneumonia A. Fib with RVR chadsvasc score of 4 on  chronic anticoagulation Nonobstructive CAD Hypertension Diabetes mellitus Diabetic neuropathy Acute on chronic kidney injurymultifactorialIE dehydration/ARB/hypotension improved Dehydration Compensated systolic/diastolic congestive heart failure Morbid obesity Obstructive sleep apnea/obesity hypoventilation syndrome History of DVT in the past Depression Degenerative joint disease. Status post hyperkalemia acute anemia, probably secondary to hydration.stable Plan Restart Neurontin as per orders. Repeat chest x-ray PA and lateral in a.m. Dr. Doylene Canard on call for the weekend  LOS: 3 days    Charolette Forward 05/17/2018, 3:17 PM

## 2018-05-18 ENCOUNTER — Inpatient Hospital Stay (HOSPITAL_COMMUNITY): Payer: Medicare HMO

## 2018-05-18 LAB — BASIC METABOLIC PANEL
Anion gap: 12 (ref 5–15)
BUN: 17 mg/dL (ref 8–23)
CO2: 21 mmol/L — ABNORMAL LOW (ref 22–32)
Calcium: 9 mg/dL (ref 8.9–10.3)
Chloride: 105 mmol/L (ref 98–111)
Creatinine, Ser: 1.41 mg/dL — ABNORMAL HIGH (ref 0.44–1.00)
GFR calc Af Amer: 46 mL/min — ABNORMAL LOW (ref >60)
GFR calc non Af Amer: 39 mL/min — ABNORMAL LOW (ref >60)
Glucose, Bld: 135 mg/dL — ABNORMAL HIGH (ref 70–99)
Potassium: 3.9 mmol/L (ref 3.5–5.1)
Sodium: 138 mmol/L (ref 135–145)

## 2018-05-18 LAB — CBC
HCT: 37.8 % (ref 36.0–46.0)
Hemoglobin: 12 g/dL (ref 12.0–15.0)
MCH: 27.6 pg (ref 26.0–34.0)
MCHC: 31.7 g/dL (ref 30.0–36.0)
MCV: 87.1 fL (ref 78.0–100.0)
Platelets: 199 10*3/uL (ref 150–400)
RBC: 4.34 MIL/uL (ref 3.87–5.11)
RDW: 15.5 % (ref 11.5–15.5)
WBC: 10.2 10*3/uL (ref 4.0–10.5)

## 2018-05-18 LAB — GLUCOSE, CAPILLARY
Glucose-Capillary: 122 mg/dL — ABNORMAL HIGH (ref 70–99)
Glucose-Capillary: 214 mg/dL — ABNORMAL HIGH (ref 70–99)
Glucose-Capillary: 207 mg/dL — ABNORMAL HIGH (ref 70–99)
Glucose-Capillary: 178 mg/dL — ABNORMAL HIGH (ref 70–99)

## 2018-05-18 MED ORDER — GUAIFENESIN 100 MG/5ML PO SOLN
5.0000 mL | ORAL | Status: DC | PRN
  Administered 2018-05-18: 100 mg via ORAL
  Filled 2018-05-18: qty 5

## 2018-05-18 MED ORDER — AZITHROMYCIN 250 MG PO TABS
500.0000 mg | ORAL_TABLET | Freq: Every day | ORAL | Status: DC
  Administered 2018-05-18 – 2018-05-20 (×3): 500 mg via ORAL
  Filled 2018-05-18 (×3): qty 2

## 2018-05-18 NOTE — Progress Notes (Signed)
Pt refuse NIV for the night. Pt is currently having a coughing spell. Patient states RN is aware

## 2018-05-18 NOTE — Progress Notes (Signed)
Physical Therapy Treatment Patient Details Name: [name] MRN: [id number] DOB: [birth year] Today's Date: 05/18/2018    History of Present Illness Patient is 62 year old female with past medical history significant for hypertension, type 2 diabetes mellitus, chronic atrial fibrillation on chronic anticoagulation, nonobstructive CAD, COPD, obstructive sleep apnea/obesity hypoventilation syndrome, history of congestive heart failure secondary to systolic/diastolic dysfunction, chronic kidney disease stage III, history of DVT in the past, depression, and degenerative joint disease, morbid obesity, positive family history of coronary artery disease, came to the ER complaining of cough associated with progressive increasing shortness of breath.  Patient found to be in A. Fib with RVR and x-ray showed new right upper lobe infiltrate.     PT Comments    Pt's session limited due to nausea today.  She did not feel up to even walking to the door in her room.  She did get up to the [facility] and provide the RN with a sample.  Pt's O2 sats and HR were stable on RA during OOB mobility.  PT will continue to follow acutely for safe mobility progression.     Follow Up Recommendations  Home health PT;Supervision/Assistance - 24 hour     Equipment Recommendations  None recommended by PT    Recommendations for Other Services   NA     Precautions / Restrictions Precautions Precautions: Fall Precaution Comments: monitor vitals    Mobility  Bed Mobility Overal bed mobility: Needs Assistance Bed Mobility: Supine to Sit;Sit to Supine     Supine to sit: Min assist;HOB elevated Sit to supine: Mod assist;HOB elevated   General bed mobility comments: Min assist to provide a hand for pt to pull up to sitting EOB.  Mod assist to help lift both legs back into the bed from sitting.   Transfers Overall transfer level: Needs assistance Equipment used: Rolling walker (2 wheeled) Transfers: Sit to/from  [facility] Sit to Stand: Min assist Stand pivot transfers: Min assist       General transfer comment: Min assist to stand and pivot to [facility]. second time when standing to pivot back to bed used RW and less assist needed.  Cues for safe hand placement during transitions.  Pt did not feel up to walking today due to nausea.   Ambulation/Gait             General Gait Details: Pt too nauseated to even walk to the door and back and I did not get her up to the chair as x-ray was waiting to take her downstairs in the bed        Balance Overall balance assessment: Needs assistance Sitting-balance support: Feet supported;Bilateral upper extremity supported Sitting balance-Leahy Scale: Good     Standing balance support: Single extremity supported;Bilateral upper extremity supported Standing balance-Leahy Scale: Poor Standing balance comment: needs external support for balance in standing.                             Cognition Arousal/Alertness: Awake/alert Behavior During Therapy: WFL for tasks assessed/performed Overall Cognitive Status: Within Functional Limits for tasks assessed                                           General Comments General comments (skin integrity, edema, etc.): HR and O2 sats stable on RA during OOB mobility.  Needed assist (for  thoroughness, she did most of her pericare) for final soapy wipe after BM on BSC.       Pertinent Vitals/Pain Pain Assessment: No/denies pain       Prior Function            PT Goals (current goals can now be found in the care plan section) Acute Rehab PT Goals Patient Stated Goal: wants to go home Progress towards PT goals: Not progressing toward goals - comment(limited by nausea today)    Frequency    Min 3X/week      PT Plan Current plan remains appropriate       AM-PAC PT "6 Clicks" Daily Activity  Outcome Measure  Difficulty turning over in bed (including  adjusting bedclothes, sheets and blankets)?: A Lot Difficulty moving from lying on back to sitting on the side of the bed? : Unable Difficulty sitting down on and standing up from a chair with arms (e.g., wheelchair, bedside commode, etc,.)?: Unable Help needed moving to and from a bed to chair (including a wheelchair)?: A Little Help needed walking in hospital room?: A Little Help needed climbing 3-5 steps with a railing? : A Lot 6 Click Score: 12    End of Session   Activity Tolerance: Patient limited by fatigue;Other (comment)(limited by nausea) Patient left: in bed;with call bell/phone within reach;Other (comment)(with transporters in room) Nurse Communication: Other (comment)(pt provided stool sample for her) PT Visit Diagnosis: Other abnormalities of gait and mobility (R26.89);Muscle weakness (generalized) (M62.81);History of falling (Z91.81)     Time: 1572-6203 PT Time Calculation (min) (ACUTE ONLY): 18 min  Charges:  $Therapeutic Activity: 8-22 mins                    Montrez Marietta B. Jackelynn Hosie, PT, DPT  Acute Rehabilitation [phone] pager #[phone] office   05/18/2018, 11:25 AM

## 2018-05-18 NOTE — Progress Notes (Signed)
Patient has refused to go on BIPAP HS tonight. Patient states she will call if she feels she needs it.

## 2018-05-19 LAB — GLUCOSE, CAPILLARY
Glucose-Capillary: 100 mg/dL — ABNORMAL HIGH (ref 70–99)
Glucose-Capillary: 201 mg/dL — ABNORMAL HIGH (ref 70–99)
Glucose-Capillary: 199 mg/dL — ABNORMAL HIGH (ref 70–99)
Glucose-Capillary: 196 mg/dL — ABNORMAL HIGH (ref 70–99)

## 2018-05-19 LAB — CULTURE, BLOOD (ROUTINE X 2)
Culture: NO GROWTH
Culture: NO GROWTH
Special Requests: ADEQUATE

## 2018-05-19 LAB — STREP PNEUMONIAE URINARY ANTIGEN: Strep Pneumo Urinary Antigen: NEGATIVE

## 2018-05-19 MED ORDER — METOPROLOL TARTRATE 50 MG PO TABS
50.0000 mg | ORAL_TABLET | Freq: Two times a day (BID) | ORAL | Status: DC
  Administered 2018-05-19 – 2018-05-20 (×3): 50 mg via ORAL
  Filled 2018-05-19 (×3): qty 1

## 2018-05-19 MED ORDER — DILTIAZEM HCL 60 MG PO TABS
60.0000 mg | ORAL_TABLET | Freq: Three times a day (TID) | ORAL | Status: DC
  Administered 2018-05-19 – 2018-05-20 (×4): 60 mg via ORAL
  Filled 2018-05-19 (×4): qty 1

## 2018-05-19 NOTE — Progress Notes (Signed)
Pt declined BIPAP at this time- will call if she changes her mind

## 2018-05-19 NOTE — Progress Notes (Signed)
Late Entry  Ref: Charolette Forward, MD   Subjective:  Some cough. No fever. Monitor atrial fibrillation. Chest x-ray pending.  Objective:  Vital Signs in the last 24 hours: Temp:  [97.6 F (36.4 C)-98.5 F (36.9 C)] 97.8 F (36.6 C) (09/29 0737) Pulse Rate:  [93-114] 105 (09/29 0740) Cardiac Rhythm: Atrial fibrillation (09/29 0700) Resp:  [16-21] 20 (09/29 0740) BP: (109-125)/(78-102) 109/90 (09/29 0740) SpO2:  [99 %-100 %] 100 % (09/29 0740) Weight:  [151.2 kg] 151.2 kg (09/29 0456)  Physical Exam: BP Readings from Last 1 Encounters:  05/19/18 109/90     Wt Readings from Last 1 Encounters:  05/19/18 (!) 151.2 kg    Weight change: -0.318 kg Body mass index is 52.2 kg/m. HEENT: [place]/AT, Eyes-Brown, PERL, EOMI, Conjunctiva-Pink, Sclera-Non-icteric Neck: No JVD, No bruit, Trachea midline. Lungs:  Clearing, Bilateral. Cardiac:  Regular rhythm, normal S1 and S2, no S3. II/VI systolic murmur. Abdomen:  Soft, non-tender. BS present. Extremities:  1 + edema present. No cyanosis. No clubbing. CNS: AxOx3, Cranial nerves grossly intact, moves all 4 extremities.  Skin: Warm and dry.   Intake/Output from previous day: 09/28 0701 - 09/29 0700 In: 720 [P.O.:720] Out: 1100 [Urine:1100]    Lab Results: BMET    Component Value Date/Time   NA 138 05/18/2018 0537   NA 137 05/17/2018 0404   NA 138 05/16/2018 0402   K 3.9 05/18/2018 0537   K 3.7 05/17/2018 0404   K 3.5 05/16/2018 0402   CL 105 05/18/2018 0537   CL 105 05/17/2018 0404   CL 104 05/16/2018 0402   CO2 21 (L) 05/18/2018 0537   CO2 22 05/17/2018 0404   CO2 26 05/16/2018 0402   GLUCOSE 135 (H) 05/18/2018 0537   GLUCOSE 131 (H) 05/17/2018 0404   GLUCOSE 175 (H) 05/16/2018 0402   BUN 17 05/18/2018 0537   BUN 26 (H) 05/17/2018 0404   BUN 40 (H) 05/16/2018 0402   CREATININE 1.41 (H) 05/18/2018 0537   CREATININE 1.66 (H) 05/17/2018 0404   CREATININE 2.34 (H) 05/16/2018 0402   CALCIUM 9.0 05/18/2018 0537   CALCIUM 8.6  (L) 05/17/2018 0404   CALCIUM 8.5 (L) 05/16/2018 0402   GFRNONAA 39 (L) 05/18/2018 0537   GFRNONAA 32 (L) 05/17/2018 0404   GFRNONAA 21 (L) 05/16/2018 0402   GFRAA 46 (L) 05/18/2018 0537   GFRAA 37 (L) 05/17/2018 0404   GFRAA 25 (L) 05/16/2018 0402   CBC    Component Value Date/Time   WBC 10.2 05/18/2018 0537   RBC 4.34 05/18/2018 0537   HGB 12.0 05/18/2018 0537   HCT 37.8 05/18/2018 0537   PLT 199 05/18/2018 0537   MCV 87.1 05/18/2018 0537   MCH 27.6 05/18/2018 0537   MCHC 31.7 05/18/2018 0537   RDW 15.5 05/18/2018 0537   LYMPHSABS 1.7 07/25/2016 1354   MONOABS 0.6 07/25/2016 1354   EOSABS 0.2 07/25/2016 1354   BASOSABS 0.0 07/25/2016 1354   HEPATIC Function Panel Recent Labs    12/26/17 1955  PROT 7.8   HEMOGLOBIN A1C No components found for: HGA1C,  MPG CARDIAC ENZYMES Lab Results  Component Value Date   CKTOTAL 118 02/27/2012   CKMB 1.1 02/27/2012   TROPONINI <0.03 12/29/2017   TROPONINI <0.03 12/29/2017   TROPONINI <0.03 12/29/2017   BNP No results for input(s): PROBNP in the last 8760 hours. TSH No results for input(s): TSH in the last 8760 hours. CHOLESTEROL Recent Labs    12/30/17 0436  CHOL 106  Scheduled Meds: . apixaban  5 mg Oral BID  . azithromycin  500 mg Oral Daily  . cycloSPORINE  1 drop Both Eyes BID  . diltiazem  30 mg Oral Q6H  . DULoxetine  60 mg Oral Daily  . feeding supplement (ENSURE ENLIVE)  237 mL Oral BID BM  . gabapentin  100 mg Oral BID  . insulin aspart  0-9 Units Subcutaneous TID WC  . insulin detemir  10 Units Subcutaneous QHS  . levalbuterol  1.25 mg Nebulization TID  . metoprolol tartrate  50 mg Oral BID  . rosuvastatin  10 mg Oral Q breakfast   Continuous Infusions: . sodium chloride 10 mL/hr at 05/17/18 1859  . cefTRIAXone (ROCEPHIN)  IV 1 g (05/18/18 1838)   PRN Meds:.acetaminophen, guaiFENesin, metoprolol tartrate, ondansetron (ZOFRAN) IV  Assessment/Plan: Right upper lobe pneumonia A. Fib,with RVR,  chronic CAD Type 2 DM HTN Dehydration Morbid obesity Obstructive sleep apnea H/O anemia of chronic disease  CXR this AM. Increase activity.   LOS: 5 days    Dixie Dials  MD  05/19/2018, 9:54 AM

## 2018-05-19 NOTE — Progress Notes (Signed)
Ref: Leslie Forward, MD   Subjective:  Feeling better. Mild tachycardia at rest. Chest x-ray clearing pneumonia. T max 98.5 degree F. Patient has not ambulated much. Agrees to start walking today and go home tomorrow.  Objective:  Vital Signs in the last 24 hours: Temp:  [97.6 F (36.4 C)-98.5 F (36.9 C)] 97.8 F (36.6 C) (09/29 0737) Pulse Rate:  [93-114] 105 (09/29 0740) Cardiac Rhythm: Atrial fibrillation (09/29 0700) Resp:  [16-21] 20 (09/29 0740) BP: (109-125)/(78-102) 109/90 (09/29 0740) SpO2:  [99 %-100 %] 100 % (09/29 0740) Weight:  [151.2 kg] 151.2 kg (09/29 0456)  Physical Exam: BP Readings from Last 1 Encounters:  05/19/18 109/90     Wt Readings from Last 1 Encounters:  05/19/18 (!) 151.2 kg    Weight change: -0.318 kg Body mass index is 52.2 kg/m. HEENT: [place]/AT, Eyes-Brown, PERL, EOMI, Conjunctiva-Pink, Sclera-Non-icteric Neck: No JVD, No bruit, Trachea midline. Lungs:  Clearing, Bilateral. Cardiac:  Regular rhythm, normal S1 and S2, no S3. II/VI systolic murmur. Abdomen:  Soft, non-tender. BS present. Extremities:  1 + edema present. No cyanosis. No clubbing. CNS: AxOx3, Cranial nerves grossly intact, moves all 4 extremities.  Skin: Warm and dry.   Intake/Output from previous day: 09/28 0701 - 09/29 0700 In: 720 [P.O.:720] Out: 1100 [Urine:1100]    Lab Results: BMET    Component Value Date/Time   NA 138 05/18/2018 0537   NA 137 05/17/2018 0404   NA 138 05/16/2018 0402   K 3.9 05/18/2018 0537   K 3.7 05/17/2018 0404   K 3.5 05/16/2018 0402   CL 105 05/18/2018 0537   CL 105 05/17/2018 0404   CL 104 05/16/2018 0402   CO2 21 (L) 05/18/2018 0537   CO2 22 05/17/2018 0404   CO2 26 05/16/2018 0402   GLUCOSE 135 (H) 05/18/2018 0537   GLUCOSE 131 (H) 05/17/2018 0404   GLUCOSE 175 (H) 05/16/2018 0402   BUN 17 05/18/2018 0537   BUN 26 (H) 05/17/2018 0404   BUN 40 (H) 05/16/2018 0402   CREATININE 1.41 (H) 05/18/2018 0537   CREATININE 1.66 (H)  05/17/2018 0404   CREATININE 2.34 (H) 05/16/2018 0402   CALCIUM 9.0 05/18/2018 0537   CALCIUM 8.6 (L) 05/17/2018 0404   CALCIUM 8.5 (L) 05/16/2018 0402   GFRNONAA 39 (L) 05/18/2018 0537   GFRNONAA 32 (L) 05/17/2018 0404   GFRNONAA 21 (L) 05/16/2018 0402   GFRAA 46 (L) 05/18/2018 0537   GFRAA 37 (L) 05/17/2018 0404   GFRAA 25 (L) 05/16/2018 0402   CBC    Component Value Date/Time   WBC 10.2 05/18/2018 0537   RBC 4.34 05/18/2018 0537   HGB 12.0 05/18/2018 0537   HCT 37.8 05/18/2018 0537   PLT 199 05/18/2018 0537   MCV 87.1 05/18/2018 0537   MCH 27.6 05/18/2018 0537   MCHC 31.7 05/18/2018 0537   RDW 15.5 05/18/2018 0537   LYMPHSABS 1.7 07/25/2016 1354   MONOABS 0.6 07/25/2016 1354   EOSABS 0.2 07/25/2016 1354   BASOSABS 0.0 07/25/2016 1354   HEPATIC Function Panel Recent Labs    12/26/17 1955  PROT 7.8   HEMOGLOBIN A1C No components found for: HGA1C,  MPG CARDIAC ENZYMES Lab Results  Component Value Date   CKTOTAL 118 02/27/2012   CKMB 1.1 02/27/2012   TROPONINI <0.03 12/29/2017   TROPONINI <0.03 12/29/2017   TROPONINI <0.03 12/29/2017   BNP No results for input(s): PROBNP in the last 8760 hours. TSH No results for input(s): TSH in the last  8760 hours. CHOLESTEROL Recent Labs    12/30/17 0436  CHOL 106    Scheduled Meds: . apixaban  5 mg Oral BID  . azithromycin  500 mg Oral Daily  . cycloSPORINE  1 drop Both Eyes BID  . diltiazem  30 mg Oral Q6H  . DULoxetine  60 mg Oral Daily  . feeding supplement (ENSURE ENLIVE)  237 mL Oral BID BM  . gabapentin  100 mg Oral BID  . insulin aspart  0-9 Units Subcutaneous TID WC  . insulin detemir  10 Units Subcutaneous QHS  . levalbuterol  1.25 mg Nebulization TID  . metoprolol tartrate  50 mg Oral BID  . rosuvastatin  10 mg Oral Q breakfast   Continuous Infusions: . sodium chloride 10 mL/hr at 05/17/18 1859  . cefTRIAXone (ROCEPHIN)  IV 1 g (05/18/18 1838)   PRN Meds:.acetaminophen, guaiFENesin, metoprolol  tartrate, ondansetron (ZOFRAN) IV  Assessment/Plan: Right upper lobe pneumonia, resolving A. Fibrillation with RVR, CHA2DS2VASC score of 4 Non-obstructive CAD HTN Type 2 DM with diabetic neuropathy CKD, III Dehydration-resolved Morbid obesity H/O DVT Obstructive sleep apnea Depression H/O Anemia of chronic disease  Increase Beta-blocker dose. Increase diltiazem dose. Increase activity. Discharge home in AM    LOS: 5 days    Dixie Dials  MD  05/19/2018, 9:40 AM

## 2018-05-20 ENCOUNTER — Encounter: Payer: Self-pay | Admitting: *Deleted

## 2018-05-20 LAB — COMPREHENSIVE METABOLIC PANEL
ALT: 216 U/L — ABNORMAL HIGH (ref 0–44)
AST: 76 U/L — ABNORMAL HIGH (ref 15–41)
Albumin: 3.3 g/dL — ABNORMAL LOW (ref 3.5–5.0)
Alkaline Phosphatase: 159 U/L — ABNORMAL HIGH (ref 38–126)
Anion gap: 11 (ref 5–15)
BUN: 17 mg/dL (ref 8–23)
CO2: 20 mmol/L — ABNORMAL LOW (ref 22–32)
Calcium: 9.2 mg/dL (ref 8.9–10.3)
Chloride: 104 mmol/L (ref 98–111)
Creatinine, Ser: 1.48 mg/dL — ABNORMAL HIGH (ref 0.44–1.00)
GFR calc Af Amer: 43 mL/min — ABNORMAL LOW (ref >60)
GFR calc non Af Amer: 37 mL/min — ABNORMAL LOW (ref >60)
Glucose, Bld: 205 mg/dL — ABNORMAL HIGH (ref 70–99)
Potassium: 4.2 mmol/L (ref 3.5–5.1)
Sodium: 135 mmol/L (ref 135–145)
Total Bilirubin: 1.1 mg/dL (ref 0.3–1.2)
Total Protein: 10.1 g/dL — ABNORMAL HIGH (ref 6.5–8.1)

## 2018-05-20 LAB — CBC
HCT: 46.2 % — ABNORMAL HIGH (ref 36.0–46.0)
Hemoglobin: 13.9 g/dL (ref 12.0–15.0)
MCH: 27.1 pg (ref 26.0–34.0)
MCHC: 30.1 g/dL (ref 30.0–36.0)
MCV: 90.1 fL (ref 78.0–100.0)
Platelets: 226 10*3/uL (ref 150–400)
RBC: 5.13 MIL/uL — ABNORMAL HIGH (ref 3.87–5.11)
RDW: 16.4 % — ABNORMAL HIGH (ref 11.5–15.5)
WBC: 9.4 10*3/uL (ref 4.0–10.5)

## 2018-05-20 LAB — GLUCOSE, CAPILLARY
Glucose-Capillary: 162 mg/dL — ABNORMAL HIGH (ref 70–99)
Glucose-Capillary: 200 mg/dL — ABNORMAL HIGH (ref 70–99)

## 2018-05-20 MED ORDER — DULOXETINE HCL 60 MG PO CPEP: 60.0000 mg | ORAL_CAPSULE | Freq: Every day | ORAL | 3 refills | Status: DC

## 2018-05-20 MED ORDER — DILTIAZEM HCL ER COATED BEADS 180 MG PO CP24: 180.0000 mg | ORAL_CAPSULE | Freq: Every day | ORAL | 11 refills | Status: DC

## 2018-05-20 MED ORDER — ACETAMINOPHEN 325 MG PO TABS: 650.0000 mg | ORAL_TABLET | Freq: Four times a day (QID) | ORAL | 1 refills | Status: DC | PRN

## 2018-05-20 MED ORDER — METOPROLOL TARTRATE 50 MG PO TABS: 50.0000 mg | ORAL_TABLET | Freq: Two times a day (BID) | ORAL | 3 refills | Status: DC

## 2018-05-20 MED FILL — ACETAMINOPHEN 325 MG TABS: 325 | 12 days supply | Qty: 100 | Fill #0 | Status: TO

## 2018-05-20 MED FILL — CARTIA XT 180 MG CAPSULE SA: 180 | 30 days supply | Qty: 30 | Fill #0 | Status: TO

## 2018-05-20 NOTE — Consult Note (Addendum)
   [facility] CM Inpatient Consult   05/20/2018  [name] 02/26/1956 [id number]   Patient screened for potential [facility] Care Management services due to multiple co- morbidities.  Went to bedside to speak with [name], prior to discharge, to discuss [place] Management services.   She was agreeable and written consent obtained. [facility] folder provided.  Confirmed best contact number is [phone]. Primary Care MD is Dr. Terrence Dupont. Denies concerns with medications or with transportation. Lives with family.  Explained [facility] Care Management will not interfere or replace services provided by home health. Patient will have [facility].  Made inpatient RNCM aware [facility] will follow post discharge.   Patient has a history of pneumonia, afib with RVR, CAD, DM, HTN, CKI, CHF.  Will make referral to [facility] for transition of care.   Marthenia Rolling, MSN-Ed, RN,BSN [facility] Liaison [phone]

## 2018-05-20 NOTE — Discharge Summary (Signed)
[name], [name] MEDICAL RECORD [id number] ACCOUNT [id number] DATE OF BIRTH:[birth year] FACILITY: [facility] LOCATION: [facility] PHYSICIAN:Graysin Luczynski Daivd Council, MD  DISCHARGE SUMMARY  DATE OF DISCHARGE:  05/20/2018  DATE OF ADMISSION:  05/14/2018  DATE OF DISCHARGE:  05/20/2018  DISCHARGE DIAGNOSES: 1.  Right upper lobe pneumonia. 2.  Atrial fibrillation with rapid ventricular response, CHADS-VASc score of 4 on chronic anticoagulation. 3.  Nonobstructive coronary artery disease. 4.  Hypertension. 5.  Diabetes mellitus. 6.  Acute on chronic kidney injury. 7.  Dehydration. 8.  Compensated systolic/diastolic congestive heart failure. 9.  Morbid obesity. 10.  Obstructive sleep apnea/obesity hypoventilation syndrome. 11.  History of deep venous thrombosis in the past. 12.  Depression. 13.  Degenerative joint disease.  FINAL DIAGNOSES: 1.  Status post right upper lobe pneumonia. 2.  Chronic atrial fibrillation with controlled ventricular response, CHADS-VASc score of 4 on chronic anticoagulation. 3.  Nonobstructive coronary artery disease. 4.  Hypertension. 5.  Diabetes mellitus. 6.  Resolving acute on chronic kidney injury. 7.  Status post dehydration. 8.  Compensated systolic/diastolic congestive heart failure. 9.  Morbid obesity. 10.  Obstructive sleep apnea/obesity hypoventilation syndrome. 11.  History of deep venous thrombosis in the past. 12.  Depression. 13.  Degenerative joint disease.  DISCHARGE  HOME MEDICATIONS:   1.  Tylenol 650 mg q.6h.  p.r.n. as needed. 2.  Diltiazem CD 180 mg 1 capsule daily. 3.  Cymbalta 60 mg daily. 4.  Gabapentin 300 mg twice daily. 5.  NovoLog sliding scale. 6.  Levemir 30 units twice daily as before. 7.  Losartan 50 mg 1 tablet daily. 8.  Multivitamin one tablet daily. 9.  Nitrostat 0.4 mg sublingual p.r.n. 10.  Protonix 40 mg twice daily. 11.  Albuterol inhaler 2 puffs twice daily as before.   12.  Restasis eyedrops as before.    13.  Crestor 10 mg 1 tablet daily. 14.  Januvia 50 mg daily. 15.  Pradaxa 150 mg twice daily. 16.  Metoprolol tartrate 50 mg twice daily.    The patient has been advised to stop Tranxene, Lanoxin, Lasix, Restoril, K-Dur, Aldactone, Carafate and Ultram.  DIET:  Low salt, low cholesterol, 1800 calories ADA diet.    The patient has been advised to monitor blood sugar twice daily and chart.  The patient also advised to monitor blood pressure and heart rate daily and chart.  CONDITION AT DISCHARGE:  Stable.  BRIEF HISTORY AND HOSPITAL COURSE:  The patient is a 62 year old female with past medical history significant for multiple medical problems, i.e., hypertension, type 2 diabetes mellitus, chronic atrial fibrillation on chronic anticoagulation,  nonobstructive CAD, COPD, obstructive sleep apnea/obesity hypoventilation syndrome, history of congestive heart failure secondary to systolic/diastolic dysfunction, chronic kidney disease stage III, history of DVT in the past, depression, degenerative  joint disease, morbid obesity, positive family history of coronary artery disease.  She came to the ER complaining of cough associated with progressive increasing shortness of breath, not feeling well for the last few days.  She did not seek any medical  attention, but stopped all her medications today, breathing got worse, associated with palpitations, so decided to call EMS.  The patient in the ED was noted to be in AFib with RVR x-ray showed new right upper lobe infiltrate with elevated white count.   The patient was started on IV Cardizem after bolus with control of heart rate in 120s and was started on IV antibiotics after pan cultures.  PHYSICAL EXAMINATION: GENERAL:  She was alert, awake, oriented x3.  VITAL SIGNS:  Blood pressure was 105/79, heart rate was 123.  Irregularly irregular.  Respiration rate was 30, O2 sats were 94%. HEENT:  Conjunctivae pink. NECK:  Supple, no JVD, no bruits. LUNGS:   Decreased breath sounds at bases with occasional right lung rhonchi. CARDIOVASCULAR:  Shows tachycardic, irregularly irregular S1, S2. ABDOMEN:  Soft, obese, distended, nontender. EXTREMITIES:  There is no clubbing, cyanosis or edema.  LABORATORY DATA:  Sodium was 139, potassium 5.3, BUN 29, creatinine 3.19.  Hemoglobin was 13.4, hematocrit 43.3, white count of 17.5.  Her hemoglobin A1c was 9.9, blood cultures have been negative.  Repeat electrolytes sodium 141, potassium 6.2.  Repeat  potassium was 3.5, BUN 33, creatinine 3.27.  Repeat BUN is 40, creatinine 2.34.  Last creatinine today BUN is 17, creatinine 1.48, which has been trending down.  Last potassium is 4.2.  Last hemoglobin 13.9, hematocrit 46.2, white count of 9.4.  Strep  pneumo urinary antigen is negative.  Repeat chest x-ray showed resolution of right upper lobe infiltrate.  BRIEF HOSPITAL COURSE:  The patient was admitted to telemetry unit.  Pan cultures were obtained.  The patient was started on IV Rocephin and Zithromax and also IV Cardizem and p.o.  Lopressor, which was increased to 50 mg twice daily.  IV Cardizem was  switched to p.o.  Cardizem with significant improvement in her heart rate.  The patient also required BiPAP during the hospital stay, which was weaned off to nasal cannula.  She has been satting above 95% on room air.  The patient has ambulated yesterday  in the room and hallway and has been maintaining her sats okay.  The patient has remained afebrile during the hospital stay.  Her white count is trending down.  DISCHARGE INSTRUCTIONS:  The patient will be discharged home on the above medications and will be followed up in my office in one week.  The patient has also been extensively consulted regarding lifestyle changes, compliance with medication, diet and  weight loss to which she agrees.  TN/NUANCE D:05/20/2018 T:05/20/2018 JOB:[id number]

## 2018-05-20 NOTE — Progress Notes (Signed)
Dr Terrence Dupont notified of orders needed for DME O2 and  HH before patient can be discharged. Will continue to monitor.

## 2018-05-20 NOTE — Discharge Instructions (Signed)
Acute Kidney Injury, Adult Acute kidney injury is a sudden worsening of kidney function. The kidneys are organs that have several jobs. They filter the blood to remove waste products and extra fluid. They also maintain a healthy balance of minerals and hormones in the body, which helps control blood pressure and keep bones strong. With this condition, your kidneys do not do their jobs as well as they should. This condition ranges from mild to severe. Over time it may develop into long-lasting (chronic) kidney disease. Early detection and treatment may prevent acute kidney injury from developing into a chronic condition. What are the causes? Common causes of this condition include:  A problem with blood flow to the kidneys. This may be caused by: ? Low blood pressure (hypotension) or shock. ? Blood loss. ? Heart and blood vessel (cardiovascular) disease. ? Severe burns. ? Liver disease.  Direct damage to the kidneys. This may be caused by: ? Certain medicines. ? A kidney infection. ? Poisoning. ? Being around or in contact with toxic substances. ? A surgical wound. ? A hard, direct hit to the kidney area.  A sudden blockage of urine flow. This may be caused by: ? Cancer. ? Kidney stones. ? An enlarged prostate in males.  What are the signs or symptoms? Symptoms of this condition may not be obvious until the condition becomes severe. Symptoms of this condition can include:  Tiredness (lethargy), or difficulty staying awake.  Nausea or vomiting.  Swelling (edema) of the face, legs, ankles, or feet.  Problems with urination, such as: ? Abdominal pain, or pain along the side of your stomach (flank). ? Decreased urine production. ? Decrease in the force of urine flow.  Muscle twitches and cramps, especially in the legs.  Confusion or trouble concentrating.  Loss of appetite.  Fever.  How is this diagnosed? This condition may be diagnosed with tests, including:  Blood  tests.  Urine tests.  Imaging tests.  A test in which a sample of tissue is removed from the kidneys to be examined under a microscope (kidney biopsy).  How is this treated? Treatment for this condition depends on the cause and how severe the condition is. In mild cases, treatment may not be needed. The kidneys may heal on their own. In more severe cases, treatment will involve:  Treating the cause of the kidney injury. This may involve changing any medicines you are taking or adjusting your dosage.  Fluids. You may need specialized IV fluids to balance your body's needs.  Having a catheter placed to drain urine and prevent blockages.  Preventing problems from occurring. This may mean avoiding certain medicines or procedures that can cause further injury to the kidneys.  In some cases treatment may also require:  A procedure to remove toxic wastes from the body (dialysis or continuous renal replacement therapy - CRRT).  Surgery. This may be done to repair a torn kidney, or to remove the blockage from the urinary system.  Follow these instructions at home: Medicines  Take over-the-counter and prescription medicines only as told by your health care provider.  Do not take any new medicines without your health care provider's approval. Many medicines can worsen your kidney damage.  Do not take any vitamin and mineral supplements without your health care provider's approval. Many nutritional supplements can worsen your kidney damage. Lifestyle  If your health care provider prescribed changes to your diet, follow them. You may need to decrease the amount of protein you eat.  Achieve  and maintain a healthy weight. If you need help with this, ask your health care provider.  Start or continue an exercise plan. Try to exercise at least 30 minutes a day, 5 days a week.  Do not use any tobacco products, such as cigarettes, chewing tobacco, and e-cigarettes. If you need help quitting, ask  your health care provider. General instructions  Keep track of your blood pressure. Report changes in your blood pressure as told by your health care provider.  Stay up to date with immunizations. Ask your health care provider which immunizations you need.  Keep all follow-up visits as told by your health care provider. This is important. Where to find more information:  American Association of Kidney Patients: BombTimer.gl  National Kidney Foundation: www.kidney.[place]: https://mathis.com/  Life Options Rehabilitation Program: ? www.lifeoptions.org ? www.kidneyschool.org Contact a health care provider if:  Your symptoms get worse.  You develop new symptoms. Get help right away if:  You develop symptoms of worsening kidney disease, which include: ? Headaches. ? Abnormally dark or light skin. ? Easy bruising. ? Frequent hiccups. ? Chest pain. ? Shortness of breath. ? End of menstruation in women. ? Seizures. ? Confusion or altered mental status. ? Abdominal or back pain. ? Itchiness.  You have a fever.  Your body is producing less urine.  You have pain or bleeding when you urinate. Summary  Acute kidney injury is a sudden worsening of kidney function.  Acute kidney injury can be caused by problems with blood flow to the kidneys, direct damage to the kidneys, and sudden blockage of urine flow.  Symptoms of this condition may not be obvious until it becomes severe. Symptoms may include edema, lethargy, confusion, nausea or vomiting, and problems passing urine.  This condition can usually be diagnosed with blood tests, urine tests, and imaging tests. Sometimes a kidney biopsy is done to diagnose this condition.  Treatment for this condition often involves treating the underlying cause. It is treated with fluids, medicines, dialysis, diet changes, or surgery. This information is not intended to replace advice given to you by your health care provider.  Make sure you discuss any questions you have with your health care provider. Document Released: 02/20/2011 Document Revised: 12/07/2016 Document Reviewed: 07/28/2016 Elsevier Interactive Patient Education  2018 [employer].  Atrial Fibrillation Atrial fibrillation is a type of irregular or rapid heartbeat (arrhythmia). In atrial fibrillation, the heart quivers continuously in a chaotic pattern. This occurs when parts of the heart receive disorganized signals that make the heart unable to pump blood normally. This can increase the risk for stroke, heart failure, and other heart-related conditions. There are different types of atrial fibrillation, including:  Paroxysmal atrial fibrillation. This type starts suddenly, and it usually stops on its own shortly after it starts.  Persistent atrial fibrillation. This type often lasts longer than a week. It may stop on its own or with treatment.  Long-lasting persistent atrial fibrillation. This type lasts longer than 12 months.  Permanent atrial fibrillation. This type does not go away.  Talk with your health care provider to learn about the type of atrial fibrillation that you have. What are the causes? This condition is caused by some heart-related conditions or procedures, including:  A heart attack.  Coronary artery disease.  Heart failure.  Heart valve conditions.  High blood pressure.  Inflammation of the sac that surrounds the heart (pericarditis).  Heart surgery.  Certain heart rhythm disorders, such as Wolf-Parkinson-White syndrome.  Other  causes include:  Pneumonia.  Obstructive sleep apnea.  Blockage of an artery in the lungs (pulmonary embolism, or PE).  Lung cancer.  Chronic lung disease.  Thyroid problems, especially if the thyroid is overactive (hyperthyroidism).  Caffeine.  Excessive alcohol use or illegal drug use.  Use of some medicines, including certain decongestants and diet pills.  Sometimes, the  cause cannot be found. What increases the risk? This condition is more likely to develop in:  People who are older in age.  People who smoke.  People who have diabetes mellitus.  People who are overweight (obese).  Athletes who exercise vigorously.  What are the signs or symptoms? Symptoms of this condition include:  A feeling that your heart is beating rapidly or irregularly.  A feeling of discomfort or pain in your chest.  Shortness of breath.  Sudden light-headedness or weakness.  Getting tired easily during exercise.  In some cases, there are no symptoms. How is this diagnosed? Your health care provider may be able to detect atrial fibrillation when taking your pulse. If detected, this condition may be diagnosed with:  An electrocardiogram (ECG).  A Holter monitor test that records your heartbeat patterns over a 24-hour period.  Transthoracic echocardiogram (TTE) to evaluate how blood flows through your heart.  Transesophageal echocardiogram (TEE) to view more detailed images of your heart.  A stress test.  Imaging tests, such as a CT scan or chest X-ray.  Blood tests.  How is this treated? The main goals of treatment are to prevent blood clots from forming and to keep your heart beating at a normal rate and rhythm. The type of treatment that you receive depends on many factors, such as your underlying medical conditions and how you feel when you are experiencing atrial fibrillation. This condition may be treated with:  Medicine to slow down the heart rate, bring the hearts rhythm back to normal, or prevent clots from forming.  Electrical cardioversion. This is a procedure that resets your hearts rhythm by delivering a controlled, low-energy shock to the heart through your skin.  Different types of ablation, such as catheter ablation, catheter ablation with pacemaker, or surgical ablation. These procedures destroy the heart tissues that send abnormal  signals. When the pacemaker is used, it is placed under your skin to help your heart beat in a regular rhythm.  Follow these instructions at home:  Take over-the counter and prescription medicines only as told by your health care provider.  If your health care provider prescribed a blood-thinning medicine (anticoagulant), take it exactly as told. Taking too much blood-thinning medicine can cause bleeding. If you do not take enough blood-thinning medicine, you will not have the protection that you need against stroke and other problems.  Do not use tobacco products, including cigarettes, chewing tobacco, and e-cigarettes. If you need help quitting, ask your health care provider.  If you have obstructive sleep apnea, manage your condition as told by your health care provider.  Do not drink alcohol.  Do not drink beverages that contain caffeine, such as coffee, soda, and tea.  Maintain a healthy weight. Do not use diet pills unless your health care provider approves. Diet pills may make heart problems worse.  Follow diet instructions as told by your health care provider.  Exercise regularly as told by your health care provider.  Keep all follow-up visits as told by your health care provider. This is important. How is this prevented?  Avoid drinking beverages that contain caffeine or alcohol.  Avoid certain medicines, especially medicines that are used for breathing problems.  Avoid certain herbs and herbal medicines, such as those that contain ephedra or ginseng.  Do not use illegal drugs, such as cocaine and amphetamines.  Do not smoke.  Manage your high blood pressure. Contact a health care provider if:  You notice a change in the rate, rhythm, or strength of your heartbeat.  You are taking an anticoagulant and you notice increased bruising.  You tire more easily when you exercise or exert yourself. Get help right away if:  You have chest pain, abdominal pain, sweating, or  weakness.  You feel nauseous.  You notice blood in your vomit, bowel movement, or urine.  You have shortness of breath.  You suddenly have swollen feet and ankles.  You feel dizzy.  You have sudden weakness or numbness of the face, arm, or leg, especially on one side of the body.  You have trouble speaking, trouble understanding, or both (aphasia).  Your face or your eyelid droops on one side. These symptoms may represent a serious problem that is an emergency. Do not wait to see if the symptoms will go away. Get medical help right away. Call your local emergency services (911 in the [place] Do not drive yourself to the hospital. This information is not intended to replace advice given to you by your health care provider. Make sure you discuss any questions you have with your health care provider. Document Released: 08/07/2005 Document Revised: 12/15/2015 Document Reviewed: 12/02/2014 Elsevier Interactive Patient Education  2018 [employer].   Acute Kidney Injury, Adult Acute kidney injury is a sudden worsening of kidney function. The kidneys are organs that have several jobs. They filter the blood to remove waste products and extra fluid. They also maintain a healthy balance of minerals and hormones in the body, which helps control blood pressure and keep bones strong. With this condition, your kidneys do not do their jobs as well as they should. This condition ranges from mild to severe. Over time it may develop into long-lasting (chronic) kidney disease. Early detection and treatment may prevent acute kidney injury from developing into a chronic condition. What are the causes? Common causes of this condition include:  A problem with blood flow to the kidneys. This may be caused by: ? Low blood pressure (hypotension) or shock. ? Blood loss. ? Heart and blood vessel (cardiovascular) disease. ? Severe burns. ? Liver disease.  Direct damage to the kidneys. This may be caused  by: ? Certain medicines. ? A kidney infection. ? Poisoning. ? Being around or in contact with toxic substances. ? A surgical wound. ? A hard, direct hit to the kidney area.  A sudden blockage of urine flow. This may be caused by: ? Cancer. ? Kidney stones. ? An enlarged prostate in males.  What are the signs or symptoms? Symptoms of this condition may not be obvious until the condition becomes severe. Symptoms of this condition can include:  Tiredness (lethargy), or difficulty staying awake.  Nausea or vomiting.  Swelling (edema) of the face, legs, ankles, or feet.  Problems with urination, such as: ? Abdominal pain, or pain along the side of your stomach (flank). ? Decreased urine production. ? Decrease in the force of urine flow.  Muscle twitches and cramps, especially in the legs.  Confusion or trouble concentrating.  Loss of appetite.  Fever.  How is this diagnosed? This condition may be diagnosed with tests, including:  Blood tests.  Urine tests.  Imaging tests.  A test in which a sample of tissue is removed from the kidneys to be examined under a microscope (kidney biopsy).  How is this treated? Treatment for this condition depends on the cause and how severe the condition is. In mild cases, treatment may not be needed. The kidneys may heal on their own. In more severe cases, treatment will involve:  Treating the cause of the kidney injury. This may involve changing any medicines you are taking or adjusting your dosage.  Fluids. You may need specialized IV fluids to balance your body's needs.  Having a catheter placed to drain urine and prevent blockages.  Preventing problems from occurring. This may mean avoiding certain medicines or procedures that can cause further injury to the kidneys.  In some cases treatment may also require:  A procedure to remove toxic wastes from the body (dialysis or continuous renal replacement therapy - CRRT).  Surgery.  This may be done to repair a torn kidney, or to remove the blockage from the urinary system.  Follow these instructions at home: Medicines  Take over-the-counter and prescription medicines only as told by your health care provider.  Do not take any new medicines without your health care provider's approval. Many medicines can worsen your kidney damage.  Do not take any vitamin and mineral supplements without your health care provider's approval. Many nutritional supplements can worsen your kidney damage. Lifestyle  If your health care provider prescribed changes to your diet, follow them. You may need to decrease the amount of protein you eat.  Achieve and maintain a healthy weight. If you need help with this, ask your health care provider.  Start or continue an exercise plan. Try to exercise at least 30 minutes a day, 5 days a week.  Do not use any tobacco products, such as cigarettes, chewing tobacco, and e-cigarettes. If you need help quitting, ask your health care provider. General instructions  Keep track of your blood pressure. Report changes in your blood pressure as told by your health care provider.  Stay up to date with immunizations. Ask your health care provider which immunizations you need.  Keep all follow-up visits as told by your health care provider. This is important. Where to find more information:  American Association of Kidney Patients: BombTimer.gl  National Kidney Foundation: www.kidney.[place]: https://mathis.com/  Life Options Rehabilitation Program: ? www.lifeoptions.org ? www.kidneyschool.org Contact a health care provider if:  Your symptoms get worse.  You develop new symptoms. Get help right away if:  You develop symptoms of worsening kidney disease, which include: ? Headaches. ? Abnormally dark or light skin. ? Easy bruising. ? Frequent hiccups. ? Chest pain. ? Shortness of breath. ? End of menstruation in  women. ? Seizures. ? Confusion or altered mental status. ? Abdominal or back pain. ? Itchiness.  You have a fever.  Your body is producing less urine.  You have pain or bleeding when you urinate. Summary  Acute kidney injury is a sudden worsening of kidney function.  Acute kidney injury can be caused by problems with blood flow to the kidneys, direct damage to the kidneys, and sudden blockage of urine flow.  Symptoms of this condition may not be obvious until it becomes severe. Symptoms may include edema, lethargy, confusion, nausea or vomiting, and problems passing urine.  This condition can usually be diagnosed with blood tests, urine tests, and imaging tests. Sometimes a kidney biopsy is done to diagnose this condition.  Treatment for  this condition often involves treating the underlying cause. It is treated with fluids, medicines, dialysis, diet changes, or surgery. This information is not intended to replace advice given to you by your health care provider. Make sure you discuss any questions you have with your health care provider. Document Released: 02/20/2011 Document Revised: 12/07/2016 Document Reviewed: 07/28/2016 Elsevier Interactive Patient Education  2018 [employer].  Atrial Fibrillation Atrial fibrillation is a type of irregular or rapid heartbeat (arrhythmia). In atrial fibrillation, the heart quivers continuously in a chaotic pattern. This occurs when parts of the heart receive disorganized signals that make the heart unable to pump blood normally. This can increase the risk for stroke, heart failure, and other heart-related conditions. There are different types of atrial fibrillation, including:  Paroxysmal atrial fibrillation. This type starts suddenly, and it usually stops on its own shortly after it starts.  Persistent atrial fibrillation. This type often lasts longer than a week. It may stop on its own or with treatment.  Long-lasting persistent atrial  fibrillation. This type lasts longer than 12 months.  Permanent atrial fibrillation. This type does not go away.  Talk with your health care provider to learn about the type of atrial fibrillation that you have. What are the causes? This condition is caused by some heart-related conditions or procedures, including:  A heart attack.  Coronary artery disease.  Heart failure.  Heart valve conditions.  High blood pressure.  Inflammation of the sac that surrounds the heart (pericarditis).  Heart surgery.  Certain heart rhythm disorders, such as Wolf-Parkinson-White syndrome.  Other causes include:  Pneumonia.  Obstructive sleep apnea.  Blockage of an artery in the lungs (pulmonary embolism, or PE).  Lung cancer.  Chronic lung disease.  Thyroid problems, especially if the thyroid is overactive (hyperthyroidism).  Caffeine.  Excessive alcohol use or illegal drug use.  Use of some medicines, including certain decongestants and diet pills.  Sometimes, the cause cannot be found. What increases the risk? This condition is more likely to develop in:  People who are older in age.  People who smoke.  People who have diabetes mellitus.  People who are overweight (obese).  Athletes who exercise vigorously.  What are the signs or symptoms? Symptoms of this condition include:  A feeling that your heart is beating rapidly or irregularly.  A feeling of discomfort or pain in your chest.  Shortness of breath.  Sudden light-headedness or weakness.  Getting tired easily during exercise.  In some cases, there are no symptoms. How is this diagnosed? Your health care provider may be able to detect atrial fibrillation when taking your pulse. If detected, this condition may be diagnosed with:  An electrocardiogram (ECG).  A Holter monitor test that records your heartbeat patterns over a 24-hour period.  Transthoracic echocardiogram (TTE) to evaluate how blood flows  through your heart.  Transesophageal echocardiogram (TEE) to view more detailed images of your heart.  A stress test.  Imaging tests, such as a CT scan or chest X-ray.  Blood tests.  How is this treated? The main goals of treatment are to prevent blood clots from forming and to keep your heart beating at a normal rate and rhythm. The type of treatment that you receive depends on many factors, such as your underlying medical conditions and how you feel when you are experiencing atrial fibrillation. This condition may be treated with:  Medicine to slow down the heart rate, bring the hearts rhythm back to normal, or prevent clots from forming.  Electrical cardioversion. This is a procedure that resets your hearts rhythm by delivering a controlled, low-energy shock to the heart through your skin.  Different types of ablation, such as catheter ablation, catheter ablation with pacemaker, or surgical ablation. These procedures destroy the heart tissues that send abnormal signals. When the pacemaker is used, it is placed under your skin to help your heart beat in a regular rhythm.  Follow these instructions at home:  Take over-the counter and prescription medicines only as told by your health care provider.  If your health care provider prescribed a blood-thinning medicine (anticoagulant), take it exactly as told. Taking too much blood-thinning medicine can cause bleeding. If you do not take enough blood-thinning medicine, you will not have the protection that you need against stroke and other problems.  Do not use tobacco products, including cigarettes, chewing tobacco, and e-cigarettes. If you need help quitting, ask your health care provider.  If you have obstructive sleep apnea, manage your condition as told by your health care provider.  Do not drink alcohol.  Do not drink beverages that contain caffeine, such as coffee, soda, and tea.  Maintain a healthy weight. Do not use diet  pills unless your health care provider approves. Diet pills may make heart problems worse.  Follow diet instructions as told by your health care provider.  Exercise regularly as told by your health care provider.  Keep all follow-up visits as told by your health care provider. This is important. How is this prevented?  Avoid drinking beverages that contain caffeine or alcohol.  Avoid certain medicines, especially medicines that are used for breathing problems.  Avoid certain herbs and herbal medicines, such as those that contain ephedra or ginseng.  Do not use illegal drugs, such as cocaine and amphetamines.  Do not smoke.  Manage your high blood pressure. Contact a health care provider if:  You notice a change in the rate, rhythm, or strength of your heartbeat.  You are taking an anticoagulant and you notice increased bruising.  You tire more easily when you exercise or exert yourself. Get help right away if:  You have chest pain, abdominal pain, sweating, or weakness.  You feel nauseous.  You notice blood in your vomit, bowel movement, or urine.  You have shortness of breath.  You suddenly have swollen feet and ankles.  You feel dizzy.  You have sudden weakness or numbness of the face, arm, or leg, especially on one side of the body.  You have trouble speaking, trouble understanding, or both (aphasia).  Your face or your eyelid droops on one side. These symptoms may represent a serious problem that is an emergency. Do not wait to see if the symptoms will go away. Get medical help right away. Call your local emergency services (911 in the [place].). Do not drive yourself to the hospital. This information is not intended to replace advice given to you by your health care provider. Make sure you discuss any questions you have with your health care provider. Document Released: 08/07/2005 Document Revised: 12/15/2015 Document Reviewed: 12/02/2014 Elsevier Interactive Patient  Education  2018 [place] Pneumonia, Adult Pneumonia is an infection of the lungs. One type of pneumonia can happen while a person is in a hospital. A different type can happen when a person is not in a hospital (community-acquired pneumonia). It is easy for this kind to spread from person to person. It can spread to you if you breathe near an infected person who coughs  or sneezes. Some symptoms include:  A dry cough.  A wet (productive) cough.  Fever.  Sweating.  Chest pain.  Follow these instructions at home:  Take over-the-counter and prescription medicines only as told by your doctor. ? Only take cough medicine if you are losing sleep. ? If you were prescribed an antibiotic medicine, take it as told by your doctor. Do not stop taking the antibiotic even if you start to feel better.  Sleep with your head and neck raised (elevated). You can do this by putting a few pillows under your head, or you can sleep in a recliner.  Do not use tobacco products. These include cigarettes, chewing tobacco, and e-cigarettes. If you need help quitting, ask your doctor.  Drink enough water to keep your pee (urine) clear or pale yellow. A shot (vaccine) can help prevent pneumonia. Shots are often suggested for:  People older than 62 years of age.  People older than 62 years of age: ? Who are having cancer treatment. ? Who have long-term (chronic) lung disease. ? Who have problems with their body's defense system (immune system).  You may also prevent pneumonia if you take these actions:  Get the flu (influenza) shot every year.  Go to the dentist as often as told.  Wash your hands often. If soap and water are not available, use hand sanitizer.  Contact a doctor if:  You have a fever.  You lose sleep because your cough medicine does not help. Get help right away if:  You are short of breath and it gets worse.  You have more chest pain.  Your sickness gets  worse. This is very serious if: ? You are an older adult. ? Your body's defense system is weak.  You cough up blood. This information is not intended to replace advice given to you by your health care provider. Make sure you discuss any questions you have with your health care provider. Document Released: 01/24/2008 Document Revised: 01/13/2016 Document Reviewed: 12/02/2014 Elsevier Interactive Patient Education  [employer].

## 2018-05-20 NOTE — Discharge Summary (Signed)
Discharge summary dictated on 05/20/2018, dictation number is [phone]

## 2018-05-20 NOTE — Progress Notes (Signed)
PT Cancellation Note  Patient Details Name: [name] MRN: [id number] DOB: [birth year]   Cancelled Treatment:    Reason Eval/Treat Not Completed: Other (comment)(refused due to going home)   Denice Paradise 05/20/2018, 3:07 PM Jadin Kagel,PT Acute Rehabilitation Services Pager:  [phone]  Office:  [phone]

## 2018-05-20 NOTE — Care Management Note (Addendum)
Case Management Note  Patient Details  Name: [name] MRN: [id number] Date of Birth: [birth year]  Subjective/Objective: Pt presented for cough and increasing SOB. PTA from home with the support of daughter. Pt has DME shower chair, cane and rolling walker. Staff RN assessing the need for DME 02.                  Action/Plan: CM provided pt with agency list for choice for [facility]. Patient chose [facility]. Referral made to [facility] with Alvis Lemmings and [facility] to begin within 24-48 hours post transition home. No further needs at this time.   Expected Discharge Date:  05/20/18               Expected Discharge Plan:  [place]  In-House Referral:  NA  Discharge planning Services  CM Consult  Post Acute Care Choice:  Home Health Choice offered to:  Patient  DME Arranged:  Oxygen DME Agency:[facility]  HH Arranged:  PT HH Agency:  [place]  Status of Service:  Completed, signed off  If discussed at [place] of Stay Meetings, dates discussed:    Additional Comments: [id number] 05-20-18 Jacqlyn Krauss, RN,BSN [phone] CM did speak with Staff RN and patient will need home 02. Orders to be placed and per pt ok to utilize [facility] for DME needs. Referral made to [facility] with [facility]. DME to be delivered to the home prior to transition home.  Bethena Roys, RN 05/20/2018, 12:43 PM

## 2018-05-20 NOTE — Progress Notes (Signed)
SATURATION QUALIFICATIONS: (This note is used to comply with regulatory documentation for home oxygen)  Patient Saturations on Room Air at Rest = 98%  Patient Saturations on Room Air while Ambulating = 88*%  Patient Saturations on 2 Liters of oxygen while Ambulating = 95%  Please briefly explain why patient needs home oxygen: Pt oxygen sat drops below 88% while ambulating.

## 2018-05-21 DIAGNOSIS — E669 Obesity, unspecified: Secondary | ICD-10-CM | POA: Diagnosis not present

## 2018-05-21 DIAGNOSIS — I110 Hypertensive heart disease with heart failure: Secondary | ICD-10-CM | POA: Diagnosis not present

## 2018-05-21 DIAGNOSIS — E662 Morbid (severe) obesity with alveolar hypoventilation: Secondary | ICD-10-CM | POA: Diagnosis not present

## 2018-05-21 DIAGNOSIS — I5032 Chronic diastolic (congestive) heart failure: Secondary | ICD-10-CM | POA: Diagnosis not present

## 2018-05-21 DIAGNOSIS — I4891 Unspecified atrial fibrillation: Secondary | ICD-10-CM | POA: Diagnosis not present

## 2018-05-21 DIAGNOSIS — R296 Repeated falls: Secondary | ICD-10-CM | POA: Diagnosis not present

## 2018-05-21 LAB — LEGIONELLA PNEUMOPHILA SEROGP 1 UR AG: L. pneumophila Serogp 1 Ur Ag: NEGATIVE

## 2018-05-22 ENCOUNTER — Other Ambulatory Visit: Payer: Self-pay | Admitting: *Deleted

## 2018-05-22 NOTE — Patient Outreach (Signed)
[facility]) Care Management  05/22/2018  [name] 10/29/1955 [id number]   Referral received from hospital liaison as member was recently admitted to hospital for Atrial fibrillation from 9/24-9/30.  Per chart, she also has history of sleep apnea and diabetes.  Call placed to member to initiate transition of care program, no answer.  HIPAA compliant voice message left.  Unsuccessful outreach letter sent, will follow up within the next 4 business days.    Valente David, [place], MSN [place] [phone]

## 2018-05-27 ENCOUNTER — Other Ambulatory Visit: Payer: Self-pay | Admitting: *Deleted

## 2018-05-27 ENCOUNTER — Ambulatory Visit: Payer: Self-pay | Admitting: *Deleted

## 2018-05-27 DIAGNOSIS — I4891 Unspecified atrial fibrillation: Secondary | ICD-10-CM | POA: Diagnosis not present

## 2018-05-27 DIAGNOSIS — J181 Lobar pneumonia, unspecified organism: Secondary | ICD-10-CM | POA: Diagnosis not present

## 2018-05-27 DIAGNOSIS — N179 Acute kidney failure, unspecified: Secondary | ICD-10-CM | POA: Diagnosis not present

## 2018-05-27 DIAGNOSIS — J440 Chronic obstructive pulmonary disease with acute lower respiratory infection: Secondary | ICD-10-CM | POA: Diagnosis not present

## 2018-05-27 DIAGNOSIS — I130 Hypertensive heart and chronic kidney disease with heart failure and stage 1 through stage 4 chronic kidney disease, or unspecified chronic kidney disease: Secondary | ICD-10-CM | POA: Diagnosis not present

## 2018-05-27 DIAGNOSIS — E860 Dehydration: Secondary | ICD-10-CM | POA: Diagnosis not present

## 2018-05-27 NOTE — Patient Outreach (Signed)
[place] [facility]) Care Management  05/27/2018  [name] May 24, 1956 [id number]   2nd attempt made to contact member for transition of care.  Identity verified.  This care manager introduced self and stated purpose of call.  [facility] care management services explained.  She report she is doing well, stated she was diagnosed with pneumonia as well as A-fib complications.  State she lives with her daughter and grandchildren and they are provided care, helping with general needs around the home.  She report she is able to bathe/dress herself, daughter does most of the cooking and preparation of medications.  Daughter also provide transportation to MD appointments.  Has follow up with primary MD, which is also her cardiologist, within the next week.    Report she has had A-fib for a while, this exacerbation episode was triggered by pneumonia.  This care manager inquires about daily heart monitoring of heart rate, she denies that she was told to do so.  Advised to discuss with MD.  Denies any urgent concerns, agrees to home visit next week.  THN CM Care Plan Problem One     Most Recent Value  Care Plan Problem One  Risk for readmission related to complications of A-fib as evidenced by recent hospitalization  Role Documenting the Problem One  Care Management [place] for Problem One  Active  [facility] Long Term Goal   Member will not be readmitted to hospital within the next 31 days as evidenced by ED/Admit report  THN Long Term Goal Start Date  05/27/18  Interventions for Problem One Long Term Goal  Discussed with member the importance of following discharge instructions, including follow up appointments, medications, diet, to decrease the risk of readmission  THN CM Short Term Goal #1   Member will keep and attend follow up appointment with cardiologist within the next 2 weeks  THN CM Short Term Goal #1 Start Date  05/27/18  Interventions for Short Term Goal #1  Confirmed  with member that cardiology appointment is scheduled, confirmed she has transportation.  Educated member on the importance of notifying this Transport planner with any transportation needs as referral to Education officer, museum can be placed  [facility] CM Short Term Goal #2   Member will check and record heart rate at least once daily over the next 4 weeks  THN CM Short Term Goal #2 Start Date  05/27/18  Interventions for Short Term Goal #2  Inquired about monitoring of heart rate, advised to discuss parameters and when to notify MD with cardiologist during visits.     Valente David, [place], MSN [place] [phone]

## 2018-05-29 DIAGNOSIS — E860 Dehydration: Secondary | ICD-10-CM | POA: Diagnosis not present

## 2018-05-29 DIAGNOSIS — N179 Acute kidney failure, unspecified: Secondary | ICD-10-CM | POA: Diagnosis not present

## 2018-05-29 DIAGNOSIS — J181 Lobar pneumonia, unspecified organism: Secondary | ICD-10-CM | POA: Diagnosis not present

## 2018-05-29 DIAGNOSIS — I4891 Unspecified atrial fibrillation: Secondary | ICD-10-CM | POA: Diagnosis not present

## 2018-05-29 DIAGNOSIS — I130 Hypertensive heart and chronic kidney disease with heart failure and stage 1 through stage 4 chronic kidney disease, or unspecified chronic kidney disease: Secondary | ICD-10-CM | POA: Diagnosis not present

## 2018-05-29 DIAGNOSIS — J440 Chronic obstructive pulmonary disease with acute lower respiratory infection: Secondary | ICD-10-CM | POA: Diagnosis not present

## 2018-06-03 DIAGNOSIS — I10 Essential (primary) hypertension: Secondary | ICD-10-CM | POA: Diagnosis not present

## 2018-06-03 DIAGNOSIS — E1140 Type 2 diabetes mellitus with diabetic neuropathy, unspecified: Secondary | ICD-10-CM | POA: Diagnosis not present

## 2018-06-03 DIAGNOSIS — E1122 Type 2 diabetes mellitus with diabetic chronic kidney disease: Secondary | ICD-10-CM | POA: Diagnosis not present

## 2018-06-03 DIAGNOSIS — I482 Chronic atrial fibrillation, unspecified: Secondary | ICD-10-CM | POA: Diagnosis not present

## 2018-06-03 DIAGNOSIS — N189 Chronic kidney disease, unspecified: Secondary | ICD-10-CM | POA: Diagnosis not present

## 2018-06-03 DIAGNOSIS — E785 Hyperlipidemia, unspecified: Secondary | ICD-10-CM | POA: Diagnosis not present

## 2018-06-03 DIAGNOSIS — G4733 Obstructive sleep apnea (adult) (pediatric): Secondary | ICD-10-CM | POA: Diagnosis not present

## 2018-06-03 DIAGNOSIS — I5020 Unspecified systolic (congestive) heart failure: Secondary | ICD-10-CM | POA: Diagnosis not present

## 2018-06-04 ENCOUNTER — Encounter: Payer: Self-pay | Admitting: *Deleted

## 2018-06-04 ENCOUNTER — Other Ambulatory Visit: Payer: Self-pay | Admitting: *Deleted

## 2018-06-04 DIAGNOSIS — I4891 Unspecified atrial fibrillation: Secondary | ICD-10-CM | POA: Diagnosis not present

## 2018-06-04 DIAGNOSIS — E860 Dehydration: Secondary | ICD-10-CM | POA: Diagnosis not present

## 2018-06-04 DIAGNOSIS — I130 Hypertensive heart and chronic kidney disease with heart failure and stage 1 through stage 4 chronic kidney disease, or unspecified chronic kidney disease: Secondary | ICD-10-CM | POA: Diagnosis not present

## 2018-06-04 DIAGNOSIS — J181 Lobar pneumonia, unspecified organism: Secondary | ICD-10-CM | POA: Diagnosis not present

## 2018-06-04 DIAGNOSIS — N179 Acute kidney failure, unspecified: Secondary | ICD-10-CM | POA: Diagnosis not present

## 2018-06-04 DIAGNOSIS — J440 Chronic obstructive pulmonary disease with acute lower respiratory infection: Secondary | ICD-10-CM | POA: Diagnosis not present

## 2018-06-04 NOTE — Patient Outreach (Signed)
[name]) Care Management   06/04/2018  [name] 1956-06-13 [id number]  [name] is an 62 y.o. female  Subjective:   Member alert and oriented x3, complains of headache as well as pain in her legs/feet.  State she has just taken Tylenol, report relief by the end of the visit.  Report she has been compliant with medications, last seen by Dr. Terrence Dupont yesterday. Follow up in 3 months.  Objective:   Review of Systems  Constitutional: Negative.   HENT: Negative.   Eyes: Negative.   Respiratory: Positive for shortness of breath.        With activity  Cardiovascular: Positive for leg swelling.  Gastrointestinal: Negative.   Genitourinary: Negative.   Musculoskeletal: Positive for joint pain.  Skin: Negative.   Neurological: Negative.   Endo/Heme/Allergies: Negative.   Psychiatric/Behavioral: Negative.     Physical Exam  Constitutional: She is oriented to person, place, and time. She appears well-developed and well-nourished.  Neck: Normal range of motion.  Cardiovascular: Normal rate.  GI: Soft. Bowel sounds are normal.  Neurological: She is alert and oriented to person, place, and time.  Skin: Skin is warm and dry.   BP 126/68   Pulse 88   Ht 1.702 m ('5\' 7"'$ )   Wt (!) 333 lb (151 kg) Comment: [facility]  SpO2 96%   BMI 52.16 kg/m   Encounter Medications:   Outpatient Encounter Medications as of 06/04/2018  Medication Sig Note  . acetaminophen (TYLENOL) 325 MG tablet Take 2 tablets (650 mg total) by mouth every 6 (six) hours as needed for mild pain, moderate pain or headache.   . digoxin (LANOXIN) 0.125 MG tablet Take 0.125 mg by mouth daily.   Marland Kitchen diltiazem (CARDIZEM CD) 180 MG 24 hr capsule Take 1 capsule (180 mg total) by mouth daily.   . DULoxetine (CYMBALTA) 60 MG capsule Take 1 capsule (60 mg total) by mouth daily.   . furosemide (LASIX) 40 MG tablet Take 40 mg by mouth 2 (two) times daily.   Marland Kitchen gabapentin (NEURONTIN) 300 MG  capsule Take 300 mg by mouth 2 (two) times daily.  05/16/2018: LF 04/18/18  . insulin aspart (NOVOLOG) 100 UNIT/ML injection Inject into the skin. PER SLIDING SCALE 05/16/2018: LF 04/16/18  . insulin detemir (LEVEMIR) 100 UNIT/ML injection Inject 30 Units into the skin 2 (two) times daily.  05/16/2018: Lf 04/30/18  . losartan (COZAAR) 50 MG tablet Take 50 mg by mouth daily. 05/16/2018: LF 04/15/18  . metoprolol tartrate (LOPRESSOR) 50 MG tablet Take 1 tablet (50 mg total) by mouth 2 (two) times daily.   . Multiple Vitamins-Minerals (MULTIVITAMIN WOMEN PO) Take 1 tablet by mouth daily.   . nitroGLYCERIN (NITROSTAT) 0.4 MG SL tablet Place 1 tablet (0.4 mg total) under the tongue every 5 (five) minutes x 3 doses as needed for chest pain.   . pantoprazole (PROTONIX) 40 MG tablet Take 1 tablet (40 mg total) by mouth 2 (two) times daily. 05/16/2018: LF 04/17/18  . PROAIR HFA 108 (90 BASE) MCG/ACT inhaler Inhale 2 puffs into the lungs every 4 (four) hours as needed for wheezing (wheezing).  05/16/2018: LF 04/19/18  . RESTASIS 0.05 % ophthalmic emulsion Place 1 drop into both eyes 2 (two) times daily. 05/16/2018: LF 04/19/18  . rosuvastatin (CRESTOR) 10 MG tablet Take 10 mg by mouth daily with breakfast. 05/16/2018: LF 04/18/18  . sitaGLIPtin (JANUVIA) 50 MG tablet Take 50 mg by mouth daily with breakfast. 05/16/2018: LF 04/18/18  . dabigatran (PRADAXA)  150 MG CAPS capsule Take 1 capsule (150 mg total) by mouth every 12 (twelve) hours. (Patient not taking: Reported on 06/04/2018) 05/16/2018: LF 04/19/18   No facility-administered encounter medications on file as of 06/04/2018.     Functional Status:   In your present state of health, do you have any difficulty performing the following activities: 06/04/2018 05/16/2018  Hearing? N N  Vision? N N  Difficulty concentrating or making decisions? N N  Walking or climbing stairs? Y Y  Dressing or bathing? Y N  Doing errands, shopping? Y N  Preparing Food and eating ? N -   Using the Toilet? N -  In the past six months, have you accidently leaked urine? N -  Do you have problems with loss of bowel control? N -  Managing your Medications? Y -  Managing your Finances? N -  Housekeeping or managing your Housekeeping? Y -  Some recent data might be hidden    Fall/Depression Screening:    Fall Risk  06/04/2018 04/26/2017  Falls in the past year? Yes Yes  Number falls in past yr: 1 2 or more  Injury with Fall? No No  Risk Factor Category  - High Fall Risk  Risk for fall due to : - Impaired balance/gait  Follow up Falls prevention discussed Falls prevention discussed   PHQ 2/9 Scores 04/26/2017  PHQ - 2 Score 6  PHQ- 9 Score 21    Assessment:    Met with member at scheduled time, consent on chart, assessment complete.  PT also visiting during the same time, makes weekly visits.  She does not have blood pressure monitor but will obtain in order to monitor heart rate as well as blood pressure.  Has scale, will begin daily weights.  Medications reviewed, has medication bottles from 4 different pharmacies ([facility], [facility] and [facility]  State she would [facility].  Does not have Pradaxa in the home, Call placed to [facility] report unable to fill until November.  [facility] does not have active prescription for Pradaxa.  She is open to having [facility] pharmacist contact for medication management.  Denies any urgent concerns at this time, provided with this care manager's contact information.  Advised to contact with questions.  Plan:   Will follow up with member within the next week. Will place referral to [facility] pharmacist for medication management.  THN CM Care Plan Problem One     Most Recent Value  Care Plan Problem One  Risk for readmission related to complications of A-fib as evidenced by recent hospitalization  Role Documenting the Problem One  Care Management [place] for Problem  One  Active  [facility] Long Term Goal   Member will not be readmitted to [facility] within the next 31 days as evidenced by ED/Admit report  THN Long Term Goal Start Date  05/27/18  Interventions for Problem One Long Term Goal  Member educated on overall plan of care, including action plan for a-fib and heart failure  THN CM Short Term Goal #1   Member will keep and attend follow up appointment with cardiologist within the next 2 weeks  THN CM Short Term Goal #1 Start Date  05/27/18  [facility] CM Short Term Goal #1 Met Date  06/04/18  THN CM Short Term Goal #2   Member will check and record heart rate at least once daily over the next 4 weeks  THN  CM Short Term Goal #2 Start Date  05/27/18  Interventions for Short Term Goal #2  Member educated on how to check heart rate, advised to obtain either blood pressure monitor or pulse oximeter to help with accurate monitoring  THN CM Short Term Goal #3  Member will report taking medications as indicated over the next 4 weeks  THN CM Short Term Goal #3 Start Date  06/04/18  Interventions for Short [place] Goal #3  Referral placed to [facility] pharmacist to help with medication reconciliation and management (transfer of all meds to one pharmacy)     Valente David, RN, MSN [place] Manager [phone]

## 2018-06-05 ENCOUNTER — Other Ambulatory Visit: Payer: Self-pay | Admitting: Pharmacist

## 2018-06-05 NOTE — Patient Outreach (Signed)
[facility]) Care Management  [place]   06/05/2018  [name] 09-03-1955 [id number]   Reason for referral: medication management and reconciliation  Referral source: [facility] RN Valente David Current insurance:[employer]  PMHx: Recent hospitalization for RUL PNA, also has atrial fibrillation (CHADS-VASC 4) on chronic anticoagulation, nonobstructive CAD, HTN, DM, CKD-III, congestive heart failure (EF 55-60% 5/'19), morbid obesity, OSA, hx DVTs, depression, and degenerative joint disease.   HPI:  Successful outreach call to [name]. HIPAA identifiers verified.  Patient reports that she currently has medications [facility] [facility] [place], [facility], [facility].  [facility]      Objective: Lab Results  Component Value Date   CREATININE 1.48 (H) 05/20/2018   CREATININE 1.41 (H) 05/18/2018   CREATININE 1.66 (H) 05/17/2018  CrCl estimated ~45 ml/min (normalized)  Lab Results  Component Value Date   HGBA1C 9.9 (H) 05/14/2018    Lipid Panel     Component Value Date/Time   CHOL 106 12/30/2017 0436   TRIG 102 12/30/2017 0436   HDL 34 (L) 12/30/2017 0436   CHOLHDL 3.1 12/30/2017 0436   VLDL 20 12/30/2017 0436   LDLCALC 52 12/30/2017 0436    BP Readings from Last 3 Encounters:  06/04/18 126/68  05/20/18 (!) 126/101  01/01/18 134/79    Allergies  Allergen Reactions  . Nsaids Other (See Comments)    On warfarin  . Sulfa Antibiotics Itching    Medications Reviewed Today    Reviewed by Valente David, RN (Registered Nurse) on 05/27/18 at 1029  Med List Status: <None>  Medication Order Taking? Sig Documenting Provider Last Dose Status Informant  acetaminophen (TYLENOL) 325 MG tablet [id number] Yes Take 2 tablets (650 mg total) by mouth every 6 (six) hours as needed for mild pain, moderate pain or headache. Charolette Forward, MD Taking Active    dabigatran (PRADAXA) 150 MG CAPS capsule [id number] Yes Take 1 capsule (150 mg total) by mouth every 12 (twelve) hours.  Patient taking differently:  Take 75 mg by mouth every 12 (twelve) hours.    Florencia Reasons, MD Taking Active Multiple Informants           Med Note Nicolasa Ducking May 16, 2018  1:57 PM) LF 04/19/18  diltiazem (CARDIZEM CD) 180 MG 24 hr capsule [id number] Yes Take 1 capsule (180 mg total) by mouth daily. Charolette Forward, MD Taking Active   DULoxetine (CYMBALTA) 60 MG capsule [id number] Yes Take 1 capsule (60 mg total) by mouth daily. Charolette Forward, MD Taking Active   gabapentin (NEURONTIN) 300 MG capsule [id number] Yes Take 300 mg by mouth 2 (two) times daily.  [provider] Taking Active Multiple Informants           Med Note Nicolasa Ducking May 16, 2018  1:59 PM) LF 04/18/18  insulin aspart (NOVOLOG) 100 UNIT/ML injection [id number] Yes Inject into the skin. PER SLIDING SCALE [provider] Taking Active Multiple Informants           Med Note Rosana Hoes, BRITTANY   Thu May 16, 2018  2:00 PM) LF 04/16/18  insulin detemir (LEVEMIR) 100 UNIT/ML injection [id number] Yes Inject 30 Units into the skin 2 (two) times daily.  [provider] Taking Active Multiple Informants           Med Note Nicolasa Ducking May 16, 2018  1:54 PM)  Lf 04/30/18  losartan (COZAAR) 50 MG tablet [id number] Yes Take 50 mg by mouth daily. [provider] Taking Active Multiple Informants           Med Note Nicolasa Ducking May 16, 2018  2:01 PM) LF 04/15/18  metoprolol tartrate (LOPRESSOR) 50 MG tablet [id number] Yes Take 1 tablet (50 mg total) by mouth 2 (two) times daily. Charolette Forward, MD Taking Active   Multiple Vitamins-Minerals (MULTIVITAMIN WOMEN PO) [id number] Yes Take 1 tablet by mouth daily. [provider] Taking Active Multiple Informants  nitroGLYCERIN (NITROSTAT) 0.4 MG SL tablet [id number] Yes Place 1 tablet (0.4 mg total) under the tongue  every 5 (five) minutes x 3 doses as needed for chest pain. Charolette Forward, MD Taking Active Multiple Informants  pantoprazole (PROTONIX) 40 MG tablet [id number] Yes Take 1 tablet (40 mg total) by mouth 2 (two) times daily. Elmarie Shiley, MD Taking Active Multiple Informants           Med Note Rosana Hoes, BRITTANY   Thu May 16, 2018  2:00 PM) LF 04/17/18  PROAIR HFA 108 (90 BASE) MCG/ACT inhaler [id number] Yes Inhale 2 puffs into the lungs every 4 (four) hours as needed for wheezing (wheezing).  [provider] Taking Active Multiple Informants           Med Note Nicolasa Ducking May 16, 2018  1:56 PM) LF 04/19/18  RESTASIS 0.05 % ophthalmic emulsion [id number] Yes Place 1 drop into both eyes 2 (two) times daily. [provider] Taking Active Multiple Informants           Med Note Nicolasa Ducking May 16, 2018  1:56 PM) LF 04/19/18  rosuvastatin (CRESTOR) 10 MG tablet [id number] Yes Take 10 mg by mouth daily with breakfast. [provider] Taking Active Multiple Informants           Med Note Nicolasa Ducking May 16, 2018  1:58 PM) LF 04/18/18  sitaGLIPtin (JANUVIA) 50 MG tablet [id number] Yes Take 50 mg by mouth daily with breakfast. [provider] Taking Active Multiple Informants           Med Note Nicolasa Ducking May 16, 2018  1:58 PM) LF 04/18/18          Assessment:  Drugs sorted by system:  Neurologic/Psychologic: clorazepate, duloxetine, gabapentin  Cardiovascular: digoxin, diltiazem, furosemide, losartan, metoprolol, SL NTG, rosuvastatin  Pulmonary/Allergy: albuterol inhaler  Gastrointestinal:pantoprazole  Endocrine: insulin aspart, insulin determir, sitagliptin  Topical:restasis eye drops  Pain: acetaminophen  Vitamins/Minerals/Supplements:MVI  Hematologic: dabigatran  Medication Review Findings:  . Taking Levemir differently than prescribed in [facility] (30 units BID vs 30 units daily + PRN QHS) . Unable to verbalize  Novolog sliding scale . Clorazepate not on [facility], added per patient report . CrCl ~45 ml/min normalized due to weight.  Sitagliptin and dabigatran doses appropriate for renal function.   Medication adherence:  Difficulty with organizing medications due to using 4 different pharmacies.  [facility]  [facility] in [facility].   Plan: I will route my note to Dr. Terrence Dupont to clarify insulin instructions and to request new prescriptions for all active medications to  [facility].  I will call patient next week to assist with de-activating any prescriptions at other pharmacies [facility] mail [facility], [facility], [facility]).    Ralene Bathe, PharmD, [place]  [phone]          

## 2018-06-06 DIAGNOSIS — E860 Dehydration: Secondary | ICD-10-CM | POA: Diagnosis not present

## 2018-06-06 DIAGNOSIS — J440 Chronic obstructive pulmonary disease with acute lower respiratory infection: Secondary | ICD-10-CM | POA: Diagnosis not present

## 2018-06-06 DIAGNOSIS — I4891 Unspecified atrial fibrillation: Secondary | ICD-10-CM | POA: Diagnosis not present

## 2018-06-06 DIAGNOSIS — N179 Acute kidney failure, unspecified: Secondary | ICD-10-CM | POA: Diagnosis not present

## 2018-06-06 DIAGNOSIS — J181 Lobar pneumonia, unspecified organism: Secondary | ICD-10-CM | POA: Diagnosis not present

## 2018-06-06 DIAGNOSIS — I130 Hypertensive heart and chronic kidney disease with heart failure and stage 1 through stage 4 chronic kidney disease, or unspecified chronic kidney disease: Secondary | ICD-10-CM | POA: Diagnosis not present

## 2018-06-07 ENCOUNTER — Other Ambulatory Visit: Payer: Self-pay | Admitting: Pharmacist

## 2018-06-07 DIAGNOSIS — G4733 Obstructive sleep apnea (adult) (pediatric): Secondary | ICD-10-CM | POA: Diagnosis not present

## 2018-06-07 NOTE — Patient Outreach (Signed)
[facility]) Care Management  [place] 06/07/2018  [name] 1956/08/09 [id number]  Reason for referral: medication management and reconciliation  Care coordination call to Dr. Zenia Resides office regarding insulin instruction clarification.   Spoke with Dr. Terrence Dupont directly who is unsure how patient needs to be taking sliding scale insulin or Levemir.  I relayed to patient that per patient, he is the only provider that she sees and all medications are from his office.  Dr. Terrence Dupont will reach out to patient directly to discuss insulin dosing.  [facility] also [facility]   Plan: Follow-up with patient on Monday regarding medications  Ralene Bathe, PharmD, [place] [phone]

## 2018-06-10 ENCOUNTER — Ambulatory Visit: Payer: Medicare HMO | Admitting: Podiatry

## 2018-06-10 ENCOUNTER — Other Ambulatory Visit: Payer: Self-pay | Admitting: Pharmacist

## 2018-06-10 ENCOUNTER — Other Ambulatory Visit: Payer: Self-pay | Admitting: *Deleted

## 2018-06-10 NOTE — Patient Outreach (Signed)
[place] [facility]) Care Management  [place] 06/10/2018  Adaleena Mooers 1956-06-22 [id number]  Reason for referral: medication management  10:25AM Outreach call to [name].  Patient answered but requests that I call her back this afternoon at 4:00 PM.  She reports that Dr. Terrence Dupont called her last week to discuss insulin.    Plan: I will reach back out to patient today at 4:00 PM.   4:10PM Unsuccessful telephone call attempt #1 to patient.   HIPAA compliant voicemail left requesting a return call  Plan:  I will make another outreach attempt to patient within 3-4 business days   Ralene Bathe, PharmD, [place] [phone]

## 2018-06-11 DIAGNOSIS — E860 Dehydration: Secondary | ICD-10-CM | POA: Diagnosis not present

## 2018-06-11 DIAGNOSIS — J440 Chronic obstructive pulmonary disease with acute lower respiratory infection: Secondary | ICD-10-CM | POA: Diagnosis not present

## 2018-06-11 DIAGNOSIS — I130 Hypertensive heart and chronic kidney disease with heart failure and stage 1 through stage 4 chronic kidney disease, or unspecified chronic kidney disease: Secondary | ICD-10-CM | POA: Diagnosis not present

## 2018-06-11 DIAGNOSIS — J181 Lobar pneumonia, unspecified organism: Secondary | ICD-10-CM | POA: Diagnosis not present

## 2018-06-11 DIAGNOSIS — N179 Acute kidney failure, unspecified: Secondary | ICD-10-CM | POA: Diagnosis not present

## 2018-06-11 DIAGNOSIS — I4891 Unspecified atrial fibrillation: Secondary | ICD-10-CM | POA: Diagnosis not present

## 2018-06-11 NOTE — Patient Outreach (Signed)
[facility]) Care Management  06/11/2018  Ginna Schuur 1955/10/13 [id number]   Call placed to member on 10/21 for transition of care, state she is unable to talk.  Requested for care manager to contact her today, after her MD appointment.  Call placed today, she report she is taking a nap and will contact this care manager when she is awake.  Will follow up within the next 4 business days if no call back.  Valente David, [place], MSN [place] [phone]

## 2018-06-12 ENCOUNTER — Other Ambulatory Visit: Payer: Self-pay | Admitting: Pharmacist

## 2018-06-12 NOTE — Patient Outreach (Signed)
[facility]) Care Management  [place] 06/12/2018  Thamar Holik 23-Feb-1956 [id number]  Reason for referral: Medication management, assist patient with only using 1 pharmacist as she currently had medications at 4 different pharmacies  Outreach: Successful call to patient.  Patient reports that Dr. Terrence Dupont called her last week about insulin as patient confused about sliding scale for insulin.  Per patient, provider encouraged patient to record CBGs TID before meals.  Patient cannot remember how many units of insulin to take with each meal or for her long-acting insulin.  She states she should have written it down and that she will call Dr. Terrence Dupont herself again for clarification.  I offered to assist with calling directly but patient declined.   3-way call placed to [place].    Medications from [facility] (proair inhaler, carafate, accu-check aviva test strips, lancets, restasis, pradaxa, potassium, pantoprazole, novolog + levemir flex pens, SL NTG, tramadol, clorazepate, januvia, hydroxyzine, gabapentin, furosemide, duloxetine, digoxin, spironolactone, rosuvastatin, metoprolol tartrate, losartan) removed from reminder calls.  Not currently on automatic refills.    Information provided to patient regarding OTC benefits program (ph: [phone]): transferred but OTC line but high volume call time.  Patient encouraged to call back tomorrow on her own to order medications and catalogue.   3-way call placed to [place]:  All medications taken off automatic refill.  Pharmacy will transfer all medications to [facility] on [address] per patient request.    [facility] only fills medications post-discharge therefore should not be sending any further medications to patient.    [facility].    All medications [facility] only be filled at [facility] moving forward.  Patient voiced appreciation.     Plan: I will follow-up with patient next week to ensure she understands how to use both short and long acting insulin.   Ralene Bathe, PharmD, [place] [phone]

## 2018-06-14 ENCOUNTER — Other Ambulatory Visit: Payer: Self-pay | Admitting: *Deleted

## 2018-06-14 NOTE — Patient Outreach (Signed)
[place] [facility]) Care Management  06/14/2018  [name] 11/03/1955 [id number]   Transition of care call placed to member to follow up on MD visit this week.  She report this is not a good time to talk, state she will call this care manager back when she is available.  Will await call back, if no call back within the next 4 business days will make another outreach attempt.  Valente David, [place], MSN [place] [phone]

## 2018-06-17 ENCOUNTER — Other Ambulatory Visit: Payer: Self-pay | Admitting: Pharmacist

## 2018-06-17 ENCOUNTER — Ambulatory Visit: Payer: Self-pay | Admitting: Pharmacist

## 2018-06-17 NOTE — Patient Outreach (Signed)
[place] [facility]) Care Management [place]  06/17/2018  [name] September 02, 1955 [id number]  Reason for referral: medication management, assist patient with only using 1 pharmacisy as she currently has medications at 4 different pharmacies  F/u call:  Call placed to [facility] today.  Patient has reported that she is unsure of how to take insulin.  Provider has called patient directly to review dosing however patient does not recall instructions.  She is unable to verbalize directions.  Patient has reported several times that she will follow-up with provider but has not done this. She has declined making 3-way calls to provider together to clarify dosing. Today, caller abruptly hung up the phone during conversation.    [facility] [place] pharmacy case is being closed due to the following reasons:  The patient has chosen to discontinue services. and The patient is non-adherent with the plan of care.  I will route note to provider to make him aware of case closure and continued lack of knowledge with insulin.  I am happy to assist in the future if needed.   Thank you for allowing [facility] pharmacy to be involved in this patient's care.    Ralene Bathe, PharmD, [place] [phone]

## 2018-06-18 DIAGNOSIS — I130 Hypertensive heart and chronic kidney disease with heart failure and stage 1 through stage 4 chronic kidney disease, or unspecified chronic kidney disease: Secondary | ICD-10-CM | POA: Diagnosis not present

## 2018-06-18 DIAGNOSIS — J440 Chronic obstructive pulmonary disease with acute lower respiratory infection: Secondary | ICD-10-CM | POA: Diagnosis not present

## 2018-06-18 DIAGNOSIS — J181 Lobar pneumonia, unspecified organism: Secondary | ICD-10-CM | POA: Diagnosis not present

## 2018-06-18 DIAGNOSIS — E860 Dehydration: Secondary | ICD-10-CM | POA: Diagnosis not present

## 2018-06-18 DIAGNOSIS — I4891 Unspecified atrial fibrillation: Secondary | ICD-10-CM | POA: Diagnosis not present

## 2018-06-18 DIAGNOSIS — N179 Acute kidney failure, unspecified: Secondary | ICD-10-CM | POA: Diagnosis not present

## 2018-06-20 ENCOUNTER — Encounter: Payer: Self-pay | Admitting: Podiatry

## 2018-06-20 ENCOUNTER — Ambulatory Visit (INDEPENDENT_AMBULATORY_CARE_PROVIDER_SITE_OTHER): Payer: Medicare HMO | Admitting: Podiatry

## 2018-06-20 ENCOUNTER — Other Ambulatory Visit: Payer: Self-pay | Admitting: *Deleted

## 2018-06-20 VITALS — BP 117/77 | HR 123

## 2018-06-20 DIAGNOSIS — B351 Tinea unguium: Secondary | ICD-10-CM | POA: Diagnosis not present

## 2018-06-20 DIAGNOSIS — M79674 Pain in right toe(s): Secondary | ICD-10-CM | POA: Diagnosis not present

## 2018-06-20 DIAGNOSIS — M79675 Pain in left toe(s): Secondary | ICD-10-CM

## 2018-06-20 DIAGNOSIS — E1142 Type 2 diabetes mellitus with diabetic polyneuropathy: Secondary | ICD-10-CM

## 2018-06-20 DIAGNOSIS — E1140 Type 2 diabetes mellitus with diabetic neuropathy, unspecified: Secondary | ICD-10-CM | POA: Diagnosis not present

## 2018-06-20 DIAGNOSIS — R600 Localized edema: Secondary | ICD-10-CM | POA: Diagnosis not present

## 2018-06-20 NOTE — Patient Outreach (Signed)
[place] [facility]) Care Management  06/20/2018  Kieana Livesay Jun 13, 1956 [id number]   Weekly transition of care call placed to member.  She state this is not a good time to talk as she is just leaving a MD appointment.  State she will call this care manager back.  If no call back, will send outreach letter as this is the 3rd consecutive call where member stated it is not a good time to talk.    Valente David, [place], MSN [place] [phone]

## 2018-06-20 NOTE — Patient Instructions (Addendum)
Diabetes and Foot Care Diabetes may cause you to have problems because of poor blood supply (circulation) to your feet and legs. This may cause the skin on your feet to become thinner, break easier, and heal more slowly. Your skin may become dry, and the skin may peel and crack. You may also have nerve damage in your legs and feet causing decreased feeling in them. You may not notice minor injuries to your feet that could lead to infections or more serious problems. Taking care of your feet is one of the most important things you can do for yourself. Follow these instructions at home:  Wear shoes at all times, even in the house. Do not go barefoot. Bare feet are easily injured.  Check your feet daily for blisters, cuts, and redness. If you cannot see the bottom of your feet, use a mirror or ask someone for help.  Wash your feet with warm water (do not use hot water) and mild soap. Then pat your feet and the areas between your toes until they are completely dry. Do not soak your feet as this can dry your skin.  Apply a moisturizing lotion or petroleum jelly (that does not contain alcohol and is unscented) to the skin on your feet and to dry, brittle toenails. Do not apply lotion between your toes.  Trim your toenails straight across. Do not dig under them or around the cuticle. File the edges of your nails with an emery board or nail file.  Do not cut corns or calluses or try to remove them with medicine.  Wear clean socks or stockings every day. Make sure they are not too tight. Do not wear knee-high stockings since they may decrease blood flow to your legs.  Wear shoes that fit properly and have enough cushioning. To break in new shoes, wear them for just a few hours a day. This prevents you from injuring your feet. Always look in your shoes before you put them on to be sure there are no objects inside.  Do not cross your legs. This may decrease the blood flow to your feet.  If you find a  minor scrape, cut, or break in the skin on your feet, keep it and the skin around it clean and dry. These areas may be cleansed with mild soap and water. Do not cleanse the area with peroxide, alcohol, or iodine.  When you remove an adhesive bandage, be sure not to damage the skin around it.  If you have a wound, look at it several times a day to make sure it is healing.  Do not use heating pads or hot water bottles. They may burn your skin. If you have lost feeling in your feet or legs, you may not know it is happening until it is too late.  Make sure your health care provider performs a complete foot exam at least annually or more often if you have foot problems. Report any cuts, sores, or bruises to your health care provider immediately. Contact a health care provider if:  You have an injury that is not healing.  You have cuts or breaks in the skin.  You have an ingrown nail.  You notice redness on your legs or feet.  You feel burning or tingling in your legs or feet.  You have pain or cramps in your legs and feet.  Your legs or feet are numb.  Your feet always feel cold. Get help right away if:  There is increasing   redness, swelling, or pain in or around a wound.  There is a red line that goes up your leg.  Pus is coming from a wound.  You develop a fever or as directed by your health care provider.  You notice a bad smell coming from an ulcer or wound. This information is not intended to replace advice given to you by your health care provider. Make sure you discuss any questions you have with your health care provider. Document Released: 08/04/2000 Document Revised: 01/13/2016 Document Reviewed: 01/14/2013 Elsevier Interactive Patient Education  2017 [employer]  Edema Edema is an abnormal buildup of fluids in your bodytissues. Edema is somewhatdependent on gravity to pull the fluid to the lowest place in your body. That makes the condition more common in the legs  and thighs (lower extremities). Painless swelling of the feet and ankles is common and becomes more likely as you get older. It is also common in looser tissues, like around your eyes. When the affected area is squeezed, the fluid may move out of that spot and leave a dent for a few moments. This dent is called pitting. What are the causes? There are many possible causes of edema. Eating too much salt and being on your feet or sitting for a long time can cause edema in your legs and ankles. Hot weather may make edema worse. Common medical causes of edema include:  Heart failure.  Liver disease.  Kidney disease.  Weak blood vessels in your legs.  Cancer.  An injury.  Pregnancy.  Some medications.  Obesity.  What are the signs or symptoms? Edema is usually painless.Your skin may look swollen or shiny. How is this diagnosed? Your health care provider may be able to diagnose edema by asking about your medical history and doing a physical exam. You may need to have tests such as X-rays, an electrocardiogram, or blood tests to check for medical conditions that may cause edema. How is this treated? Edema treatment depends on the cause. If you have heart, liver, or kidney disease, you need the treatment appropriate for these conditions. General treatment may include:  Elevation of the affected body part above the level of your heart.  Compression of the affected body part. Pressure from elastic bandages or support stockings squeezes the tissues and forces fluid back into the blood vessels. This keeps fluid from entering the tissues.  Restriction of fluid and salt intake.  Use of a water pill (diuretic). These medications are appropriate only for some types of edema. They pull fluid out of your body and make you urinate more often. This gets rid of fluid and reduces swelling, but diuretics can have side effects. Only use diuretics as directed by your health care provider.  Follow these  instructions at home:  Keep the affected body part above the level of your heart when you are lying down.  Do not sit still or stand for prolonged periods.  Do not put anything directly under your knees when lying down.  Do not wear constricting clothing or garters on your upper legs.  Exercise your legs to work the fluid back into your blood vessels. This may help the swelling go down.  Wear elastic bandages or support stockings to reduce ankle swelling as directed by your health care provider.  Eat a low-salt diet to reduce fluid if your health care provider recommends it.  Only take medicines as directed by your health care provider. Contact a health care provider if:  Your  edema is not responding to treatment.  You have heart, liver, or kidney disease and notice symptoms of edema.  You have edema in your legs that does not improve after elevating them.  You have sudden and unexplained weight gain. Get help right away if:  You develop shortness of breath or chest pain.  You cannot breathe when you lie down.  You develop pain, redness, or warmth in the swollen areas.  You have heart, liver, or kidney disease and suddenly get edema.  You have a fever and your symptoms suddenly get worse. This information is not intended to replace advice given to you by your health care provider. Make sure you discuss any questions you have with your health care provider. Document Released: 08/07/2005 Document Revised: 01/13/2016 Document Reviewed: 05/30/2013 Elsevier Interactive Patient Education  2017 [employer].

## 2018-06-21 DIAGNOSIS — I4891 Unspecified atrial fibrillation: Secondary | ICD-10-CM | POA: Diagnosis not present

## 2018-06-21 DIAGNOSIS — I130 Hypertensive heart and chronic kidney disease with heart failure and stage 1 through stage 4 chronic kidney disease, or unspecified chronic kidney disease: Secondary | ICD-10-CM | POA: Diagnosis not present

## 2018-06-21 DIAGNOSIS — J440 Chronic obstructive pulmonary disease with acute lower respiratory infection: Secondary | ICD-10-CM | POA: Diagnosis not present

## 2018-06-21 DIAGNOSIS — J181 Lobar pneumonia, unspecified organism: Secondary | ICD-10-CM | POA: Diagnosis not present

## 2018-06-21 DIAGNOSIS — E860 Dehydration: Secondary | ICD-10-CM | POA: Diagnosis not present

## 2018-06-21 DIAGNOSIS — E669 Obesity, unspecified: Secondary | ICD-10-CM | POA: Diagnosis not present

## 2018-06-21 DIAGNOSIS — E662 Morbid (severe) obesity with alveolar hypoventilation: Secondary | ICD-10-CM | POA: Diagnosis not present

## 2018-06-21 DIAGNOSIS — N179 Acute kidney failure, unspecified: Secondary | ICD-10-CM | POA: Diagnosis not present

## 2018-06-21 DIAGNOSIS — R296 Repeated falls: Secondary | ICD-10-CM | POA: Diagnosis not present

## 2018-06-21 DIAGNOSIS — I5032 Chronic diastolic (congestive) heart failure: Secondary | ICD-10-CM | POA: Diagnosis not present

## 2018-06-21 DIAGNOSIS — I110 Hypertensive heart disease with heart failure: Secondary | ICD-10-CM | POA: Diagnosis not present

## 2018-06-25 DIAGNOSIS — I4891 Unspecified atrial fibrillation: Secondary | ICD-10-CM | POA: Diagnosis not present

## 2018-06-25 DIAGNOSIS — J181 Lobar pneumonia, unspecified organism: Secondary | ICD-10-CM | POA: Diagnosis not present

## 2018-06-25 DIAGNOSIS — I130 Hypertensive heart and chronic kidney disease with heart failure and stage 1 through stage 4 chronic kidney disease, or unspecified chronic kidney disease: Secondary | ICD-10-CM | POA: Diagnosis not present

## 2018-06-25 DIAGNOSIS — J440 Chronic obstructive pulmonary disease with acute lower respiratory infection: Secondary | ICD-10-CM | POA: Diagnosis not present

## 2018-06-25 DIAGNOSIS — N179 Acute kidney failure, unspecified: Secondary | ICD-10-CM | POA: Diagnosis not present

## 2018-06-25 DIAGNOSIS — E860 Dehydration: Secondary | ICD-10-CM | POA: Diagnosis not present

## 2018-06-26 DIAGNOSIS — I4891 Unspecified atrial fibrillation: Secondary | ICD-10-CM | POA: Diagnosis not present

## 2018-06-26 DIAGNOSIS — J181 Lobar pneumonia, unspecified organism: Secondary | ICD-10-CM | POA: Diagnosis not present

## 2018-06-26 DIAGNOSIS — E860 Dehydration: Secondary | ICD-10-CM | POA: Diagnosis not present

## 2018-06-26 DIAGNOSIS — N179 Acute kidney failure, unspecified: Secondary | ICD-10-CM | POA: Diagnosis not present

## 2018-06-26 DIAGNOSIS — J440 Chronic obstructive pulmonary disease with acute lower respiratory infection: Secondary | ICD-10-CM | POA: Diagnosis not present

## 2018-06-26 DIAGNOSIS — I130 Hypertensive heart and chronic kidney disease with heart failure and stage 1 through stage 4 chronic kidney disease, or unspecified chronic kidney disease: Secondary | ICD-10-CM | POA: Diagnosis not present

## 2018-06-28 ENCOUNTER — Other Ambulatory Visit: Payer: Self-pay | Admitting: *Deleted

## 2018-06-28 NOTE — Patient Outreach (Signed)
[place] [facility]) Care Management  06/28/2018  [name] May 03, 1956 [id number]   Transition of care call placed to member, no answer.  HIPAA compliant voice message left.  This care manager has made several attempts over the last few weeks to contact member, she has consistently either stated it was not a good time to talk or there was no answer.  Unsuccessful outreach letter sent to member, will follow up within the next 4 business days.  Valente David, [place], MSN [place] [phone]

## 2018-06-30 ENCOUNTER — Inpatient Hospital Stay (HOSPITAL_COMMUNITY)
Admission: EM | Admit: 2018-06-30 | Discharge: 2018-07-04 | DRG: 291 | Disposition: A | Discharge status: Home Health | Payer: Medicare HMO | Attending: Cardiovascular Disease | Admitting: Cardiovascular Disease

## 2018-06-30 ENCOUNTER — Other Ambulatory Visit: Payer: Self-pay

## 2018-06-30 ENCOUNTER — Emergency Department (HOSPITAL_COMMUNITY): Payer: Medicare HMO

## 2018-06-30 ENCOUNTER — Encounter (HOSPITAL_COMMUNITY): Payer: Self-pay

## 2018-06-30 DIAGNOSIS — R011 Cardiac murmur, unspecified: Secondary | ICD-10-CM | POA: Diagnosis present

## 2018-06-30 DIAGNOSIS — I509 Heart failure, unspecified: Secondary | ICD-10-CM | POA: Diagnosis present

## 2018-06-30 DIAGNOSIS — N183 Chronic kidney disease, stage 3 (moderate): Secondary | ICD-10-CM | POA: Diagnosis not present

## 2018-06-30 DIAGNOSIS — Z794 Long term (current) use of insulin: Secondary | ICD-10-CM | POA: Diagnosis not present

## 2018-06-30 DIAGNOSIS — I5023 Acute on chronic systolic (congestive) heart failure: Secondary | ICD-10-CM | POA: Diagnosis not present

## 2018-06-30 DIAGNOSIS — K219 Gastro-esophageal reflux disease without esophagitis: Secondary | ICD-10-CM | POA: Diagnosis present

## 2018-06-30 DIAGNOSIS — J449 Chronic obstructive pulmonary disease, unspecified: Secondary | ICD-10-CM | POA: Diagnosis present

## 2018-06-30 DIAGNOSIS — I2510 Atherosclerotic heart disease of native coronary artery without angina pectoris: Secondary | ICD-10-CM | POA: Diagnosis not present

## 2018-06-30 DIAGNOSIS — R1084 Generalized abdominal pain: Secondary | ICD-10-CM | POA: Diagnosis not present

## 2018-06-30 DIAGNOSIS — I4811 Longstanding persistent atrial fibrillation: Secondary | ICD-10-CM | POA: Diagnosis not present

## 2018-06-30 DIAGNOSIS — I5033 Acute on chronic diastolic (congestive) heart failure: Secondary | ICD-10-CM | POA: Diagnosis present

## 2018-06-30 DIAGNOSIS — R188 Other ascites: Secondary | ICD-10-CM | POA: Diagnosis present

## 2018-06-30 DIAGNOSIS — R079 Chest pain, unspecified: Secondary | ICD-10-CM | POA: Diagnosis not present

## 2018-06-30 DIAGNOSIS — E785 Hyperlipidemia, unspecified: Secondary | ICD-10-CM | POA: Diagnosis present

## 2018-06-30 DIAGNOSIS — I5031 Acute diastolic (congestive) heart failure: Secondary | ICD-10-CM | POA: Diagnosis present

## 2018-06-30 DIAGNOSIS — I482 Chronic atrial fibrillation, unspecified: Secondary | ICD-10-CM | POA: Diagnosis not present

## 2018-06-30 DIAGNOSIS — M1990 Unspecified osteoarthritis, unspecified site: Secondary | ICD-10-CM | POA: Diagnosis present

## 2018-06-30 DIAGNOSIS — Z882 Allergy status to sulfonamides status: Secondary | ICD-10-CM

## 2018-06-30 DIAGNOSIS — Z6841 Body Mass Index (BMI) 40.0 and over, adult: Secondary | ICD-10-CM | POA: Diagnosis not present

## 2018-06-30 DIAGNOSIS — Z8249 Family history of ischemic heart disease and other diseases of the circulatory system: Secondary | ICD-10-CM | POA: Diagnosis not present

## 2018-06-30 DIAGNOSIS — Z79899 Other long term (current) drug therapy: Secondary | ICD-10-CM | POA: Diagnosis not present

## 2018-06-30 DIAGNOSIS — K7200 Acute and subacute hepatic failure without coma: Secondary | ICD-10-CM | POA: Diagnosis not present

## 2018-06-30 DIAGNOSIS — R0789 Other chest pain: Secondary | ICD-10-CM | POA: Diagnosis not present

## 2018-06-30 DIAGNOSIS — I130 Hypertensive heart and chronic kidney disease with heart failure and stage 1 through stage 4 chronic kidney disease, or unspecified chronic kidney disease: Principal | ICD-10-CM | POA: Diagnosis present

## 2018-06-30 DIAGNOSIS — Z86718 Personal history of other venous thrombosis and embolism: Secondary | ICD-10-CM | POA: Diagnosis not present

## 2018-06-30 DIAGNOSIS — E662 Morbid (severe) obesity with alveolar hypoventilation: Secondary | ICD-10-CM | POA: Diagnosis present

## 2018-06-30 DIAGNOSIS — I10 Essential (primary) hypertension: Secondary | ICD-10-CM | POA: Diagnosis not present

## 2018-06-30 DIAGNOSIS — Z90710 Acquired absence of both cervix and uterus: Secondary | ICD-10-CM | POA: Diagnosis not present

## 2018-06-30 DIAGNOSIS — R0602 Shortness of breath: Secondary | ICD-10-CM | POA: Diagnosis not present

## 2018-06-30 DIAGNOSIS — J80 Acute respiratory distress syndrome: Secondary | ICD-10-CM | POA: Diagnosis not present

## 2018-06-30 DIAGNOSIS — R109 Unspecified abdominal pain: Secondary | ICD-10-CM | POA: Diagnosis not present

## 2018-06-30 DIAGNOSIS — Z7902 Long term (current) use of antithrombotics/antiplatelets: Secondary | ICD-10-CM | POA: Diagnosis not present

## 2018-06-30 DIAGNOSIS — I4891 Unspecified atrial fibrillation: Secondary | ICD-10-CM

## 2018-06-30 DIAGNOSIS — E1122 Type 2 diabetes mellitus with diabetic chronic kidney disease: Secondary | ICD-10-CM | POA: Diagnosis present

## 2018-06-30 DIAGNOSIS — R945 Abnormal results of liver function studies: Secondary | ICD-10-CM | POA: Diagnosis not present

## 2018-06-30 LAB — COMPREHENSIVE METABOLIC PANEL
ALT: 227 U/L — ABNORMAL HIGH (ref 0–44)
AST: 358 U/L — ABNORMAL HIGH (ref 15–41)
Albumin: 3.3 g/dL — ABNORMAL LOW (ref 3.5–5.0)
Alkaline Phosphatase: 136 U/L — ABNORMAL HIGH (ref 38–126)
Anion gap: 13 (ref 5–15)
BUN: 53 mg/dL — ABNORMAL HIGH (ref 8–23)
CO2: 25 mmol/L (ref 22–32)
Calcium: 9.2 mg/dL (ref 8.9–10.3)
Chloride: 98 mmol/L (ref 98–111)
Creatinine, Ser: 2.69 mg/dL — ABNORMAL HIGH (ref 0.44–1.00)
GFR calc Af Amer: 21 mL/min — ABNORMAL LOW (ref >60)
GFR calc non Af Amer: 18 mL/min — ABNORMAL LOW (ref >60)
Glucose, Bld: 138 mg/dL — ABNORMAL HIGH (ref 70–99)
Potassium: 3.8 mmol/L (ref 3.5–5.1)
Sodium: 136 mmol/L (ref 135–145)
Total Bilirubin: 4.1 mg/dL — ABNORMAL HIGH (ref 0.3–1.2)
Total Protein: 8.4 g/dL — ABNORMAL HIGH (ref 6.5–8.1)

## 2018-06-30 LAB — CBC WITH DIFFERENTIAL/PLATELET
Abs Immature Granulocytes: 0.06 10*3/uL (ref 0.00–0.07)
Basophils Absolute: 0 10*3/uL (ref 0.0–0.1)
Basophils Relative: 0 %
Eosinophils Absolute: 0.1 10*3/uL (ref 0.0–0.5)
Eosinophils Relative: 1 %
HCT: 40.9 % (ref 36.0–46.0)
Hemoglobin: 12.3 g/dL (ref 12.0–15.0)
Immature Granulocytes: 1 %
Lymphocytes Relative: 17 %
Lymphs Abs: 2.3 10*3/uL (ref 0.7–4.0)
MCH: 25.6 pg — ABNORMAL LOW (ref 26.0–34.0)
MCHC: 30.1 g/dL (ref 30.0–36.0)
MCV: 85.2 fL (ref 80.0–100.0)
Monocytes Absolute: 1.8 10*3/uL — ABNORMAL HIGH (ref 0.1–1.0)
Monocytes Relative: 13 %
Neutro Abs: 9 10*3/uL — ABNORMAL HIGH (ref 1.7–7.7)
Neutrophils Relative %: 68 %
Platelets: 252 10*3/uL (ref 150–400)
RBC: 4.8 MIL/uL (ref 3.87–5.11)
RDW: 18.2 % — ABNORMAL HIGH (ref 11.5–15.5)
WBC: 13.2 10*3/uL — ABNORMAL HIGH (ref 4.0–10.5)
nRBC: 0.2 % (ref 0.0–0.2)

## 2018-06-30 LAB — LIPASE, BLOOD: Lipase: 91 U/L — ABNORMAL HIGH (ref 11–51)

## 2018-06-30 LAB — URINALYSIS, ROUTINE W REFLEX MICROSCOPIC
Bilirubin Urine: NEGATIVE
Glucose, UA: NEGATIVE mg/dL
Hgb urine dipstick: NEGATIVE
Ketones, ur: NEGATIVE mg/dL
Leukocytes, UA: NEGATIVE
Nitrite: NEGATIVE
Protein, ur: NEGATIVE mg/dL
Specific Gravity, Urine: 1.01 (ref 1.005–1.030)
pH: 5 (ref 5.0–8.0)

## 2018-06-30 LAB — I-STAT TROPONIN, ED: Troponin i, poc: 0.04 ng/mL (ref 0.00–0.08)

## 2018-06-30 LAB — BRAIN NATRIURETIC PEPTIDE: B Natriuretic Peptide: 331.9 pg/mL — ABNORMAL HIGH (ref 0.0–100.0)

## 2018-06-30 MED ORDER — DILTIAZEM HCL-DEXTROSE 100-5 MG/100ML-% IV SOLN (PREMIX)
20.0000 mg | Freq: Once | INTRAVENOUS | Status: DC
  Filled 2018-06-30: qty 100

## 2018-06-30 MED ORDER — ONDANSETRON HCL 4 MG/2ML IJ SOLN
4.0000 mg | Freq: Once | INTRAMUSCULAR | Status: AC
  Administered 2018-06-30: 4 mg via INTRAVENOUS
  Filled 2018-06-30: qty 2

## 2018-06-30 MED ORDER — DILTIAZEM HCL 25 MG/5ML IV SOLN
15.0000 mg | Freq: Once | INTRAVENOUS | Status: AC
  Administered 2018-06-30: 15 mg via INTRAVENOUS

## 2018-06-30 MED ORDER — DILTIAZEM HCL-DEXTROSE 100-5 MG/100ML-% IV SOLN (PREMIX)
5.0000 mg/h | Freq: Once | INTRAVENOUS | Status: AC
  Administered 2018-06-30: 5 mg/h via INTRAVENOUS
  Filled 2018-06-30: qty 100

## 2018-06-30 MED ORDER — METOPROLOL TARTRATE 5 MG/5ML IV SOLN
5.0000 mg | Freq: Once | INTRAVENOUS | Status: AC
  Administered 2018-06-30: 5 mg via INTRAVENOUS
  Filled 2018-06-30: qty 5

## 2018-06-30 MED ORDER — HYDROMORPHONE HCL 1 MG/ML IJ SOLN
0.5000 mg | Freq: Once | INTRAMUSCULAR | Status: AC
  Administered 2018-06-30: 0.5 mg via INTRAVENOUS
  Filled 2018-06-30: qty 1

## 2018-06-30 MED ORDER — FUROSEMIDE 10 MG/ML IJ SOLN
40.0000 mg | Freq: Once | INTRAMUSCULAR | Status: AC
  Administered 2018-06-30: 40 mg via INTRAVENOUS
  Filled 2018-06-30: qty 4

## 2018-06-30 NOTE — ED Triage Notes (Signed)
Per [facility], pt from home with a 2 day complaint of CP, abd pain, SOB, N/V/D, and BLE. Pt found to be tachycardic with an interpretation of a-fib in the field. BP 256/219, HR 150-80, SpO2 100% RA, CBG 115, RR 18.

## 2018-06-30 NOTE — ED Notes (Signed)
Patient transported to X-ray 

## 2018-06-30 NOTE — ED Notes (Signed)
Patient transported to CT 

## 2018-06-30 NOTE — ED Provider Notes (Signed)
[facility] EMERGENCY DEPARTMENT Provider Note  CSN: [id number] Arrival date & time: 06/30/18  1747  History   Chief Complaint Chief Complaint  Patient presents with  . Chest Pain   HPI Angelyn Osterberg is a 62 y.o. female with a complex medical history that includes a-fib, diastolic HF, Type 2 DM, COPD who presented to the ED for abdominal pain.  Abdominal Pain   This is a new problem. Episode onset: 4 days ago. The problem occurs constantly. The problem has not changed since onset.The pain is associated with an unknown (Denies sick contacts, change in medications or diet) factor. The pain is located in the LUQ. The quality of the pain is aching. The pain is moderate. Associated symptoms include diarrhea, nausea and vomiting. Pertinent negatives include fever, hematochezia, melena, constipation and headaches.   Patient states because she has been unable to eat much over the last 2 days that she has been unable to take her regular daily medications. She endorses leg swelling, chest pain, SOB, palpitations and 10lb weight gain.   Past Medical History:  Diagnosis Date  . Allergic rhinitis   . Arthritis   . Asthma   . Chest pain 12/27/2017  . Chronic diastolic CHF (congestive heart failure) ([place])   . COPD (chronic obstructive pulmonary disease) ([place])   . Depression   . DM (diabetes mellitus) ([place])   . DVT (deep vein thrombosis) in pregnancy   . Dysrhythmia    atrial fibrilation  . GERD (gastroesophageal reflux disease)   . Headache(784.0)   . HTN (hypertension)   . Hyperlipidemia   . Obesity   . Pneumonia 04/2018   RIGHT LOBE  . Shortness of breath   . Sleep apnea    compliant with CPAP    Patient Active Problem List   Diagnosis Date Noted  . Right upper lobe pneumonia ([place]) 05/14/2018  . Atrial fibrillation with RVR ([place]) 05/14/2018  . Chronic atrial fibrillation 12/26/2017  . Diastolic dysfunction 30/86/5784  . Diabetes mellitus type 2 in obese  ([place]) 12/26/2017  . Nausea vomiting and diarrhea   . Chest pain 02/26/2012  . OSA (obstructive sleep apnea) 09/21/2011  . Hypoxia 09/21/2011  . Diabetes mellitus ([place]) 02/04/2010  . Morbid obesity ([place]) 02/04/2010  . Atrial fibrillation ([place]) 02/04/2010  . GASTROPARESIS 02/04/2010  . GASTROENTERITIS 02/04/2010    Past Surgical History:  Procedure Laterality Date  . ABDOMINAL HYSTERECTOMY    . CATARACT EXTRACTION, BILATERAL    . CHOLECYSTECTOMY    . COLONOSCOPY WITH PROPOFOL N/A 03/05/2015   Procedure: COLONOSCOPY WITH PROPOFOL;  Surgeon: Carol Ada, MD;  Location: [facility];  Service: Endoscopy;  Laterality: N/A;  . DIAGNOSTIC LAPAROSCOPY    . ingrown hallux Left   . KNEE SURGERY    . LEFT HEART CATH AND CORONARY ANGIOGRAPHY N/A 12/31/2017   Procedure: LEFT HEART CATH AND CORONARY ANGIOGRAPHY;  Surgeon: Charolette Forward, MD;  Location: [facility];  Service: Cardiovascular;  Laterality: N/A;     OB History   None      Home Medications    Prior to Admission medications   Medication Sig Start Date End Date Taking? Authorizing Provider  dabigatran (PRADAXA) 150 MG CAPS capsule Take 1 capsule (150 mg total) by mouth every 12 (twelve) hours. 04/12/16  Yes Florencia Reasons, MD  diltiazem (CARDIZEM CD) 180 MG 24 hr capsule Take 1 capsule (180 mg total) by mouth daily. 05/20/18 05/20/19 Yes Charolette Forward, MD  DULoxetine (CYMBALTA) 60 MG capsule  Take 1 capsule (60 mg total) by mouth daily. 05/20/18  Yes Charolette Forward, MD  gabapentin (NEURONTIN) 300 MG capsule Take 300 mg by mouth 2 (two) times daily.  09/13/11  Yes [provider]  insulin aspart (NOVOLOG) 100 UNIT/ML injection Inject 0-10 Units into the skin. PER SLIDING SCALE   Yes [provider]  insulin detemir (LEVEMIR) 100 UNIT/ML injection Inject 30 Units into the skin 2 (two) times daily.    Yes [provider]  losartan (COZAAR) 50 MG tablet Take 50 mg by mouth daily.   Yes [provider]    metoprolol tartrate (LOPRESSOR) 50 MG tablet Take 1 tablet (50 mg total) by mouth 2 (two) times daily. 05/20/18  Yes Charolette Forward, MD  Multiple Vitamins-Minerals (MULTIVITAMIN WOMEN PO) Take 1 tablet by mouth daily.   Yes [provider]  pantoprazole (PROTONIX) 40 MG tablet Take 1 tablet (40 mg total) by mouth 2 (two) times daily. 01/01/18  Yes Regalado, Belkys A, MD  PROAIR HFA 108 (90 BASE) MCG/ACT inhaler Inhale 2 puffs into the lungs every 4 (four) hours as needed for wheezing (wheezing).  05/28/12  Yes [provider]  RESTASIS 0.05 % ophthalmic emulsion Place 1 drop into both eyes 2 (two) times daily. 11/19/17  Yes [provider]  rosuvastatin (CRESTOR) 10 MG tablet Take 10 mg by mouth daily with breakfast.   Yes [provider]  sitaGLIPtin (JANUVIA) 50 MG tablet Take 50 mg by mouth daily with breakfast.   Yes [provider]  acetaminophen (TYLENOL) 325 MG tablet Take 2 tablets (650 mg total) by mouth every 6 (six) hours as needed for mild pain, moderate pain or headache. 05/20/18   Charolette Forward, MD  nitroGLYCERIN (NITROSTAT) 0.4 MG SL tablet Place 1 tablet (0.4 mg total) under the tongue every 5 (five) minutes x 3 doses as needed for chest pain. 01/08/14   Charolette Forward, MD    Family History Family History  Problem Relation Age of Onset  . Cancer Mother   . Hypertension Mother   . Cancer Father   . CAD Unknown   . Hypertension Sister   . Hypertension Brother     Social History Social History   Tobacco Use  . Smoking status: Never Smoker  . Smokeless tobacco: Never Used  Substance Use Topics  . Alcohol use: No  . Drug use: No     Allergies   Sulfa antibiotics   Review of Systems Review of Systems  Constitutional: Positive for unexpected weight change. Negative for chills and fever.  HENT: Negative.   Eyes: Negative.   Respiratory: Positive for shortness of breath. Negative for cough.   Cardiovascular: Positive for  chest pain, palpitations and leg swelling.  Gastrointestinal: Positive for abdominal pain, diarrhea, nausea and vomiting. Negative for constipation, hematochezia and melena.  Genitourinary: Negative.   Musculoskeletal: Negative.   Skin: Negative.   Neurological: Negative for dizziness, weakness, light-headedness, numbness and headaches.   Physical Exam Updated Vital Signs BP 103/90   Pulse (!) 36   Temp (!) 97.3 F (36.3 C) (Oral) Comment: Simultaneous filing. User may not have seen previous data. Comment (Src): Simultaneous filing. User may not have seen previous data.  Resp (!) 99 Comment: Simultaneous filing. User may not have seen previous data.  Ht 5\' 7"  (1.702 m)   Wt (!) 161.5 kg   SpO2 100%   BMI 55.76 kg/m   Physical Exam  Constitutional: She is cooperative.  Obese. In discomfort.  Eyes: Pupils are equal, round, and reactive to light. Conjunctivae, EOM and lids are normal. Scleral icterus is present.  Neck: Normal carotid pulses present. Carotid bruit is not present.  Cardiovascular: An irregularly irregular rhythm present. Tachycardia present.  Pulmonary/Chest: Effort normal.  Shallow breathing. Rales heard in bases bilaterally.  Abdominal: Bowel sounds are normal. She exhibits distension. There is tenderness in the left upper quadrant and left lower quadrant. There is no rigidity, no rebound and no guarding.  Musculoskeletal: Normal range of motion.       Right lower leg: She exhibits edema.       Left lower leg: She exhibits edema.  Significant bilateral lower extremity. 2+ pitting edema.  Neurological: She is alert.  Skin: Skin is dry.  Nursing note and vitals reviewed.  ED Treatments / Results  Labs (all labs ordered are listed, but only abnormal results are displayed) Labs Reviewed  CBC WITH DIFFERENTIAL/PLATELET - Abnormal; Notable for the following components:      Result Value   WBC 13.2 (*)    MCH 25.6 (*)    RDW 18.2 (*)    Neutro Abs 9.0 (*)     Monocytes Absolute 1.8 (*)    All other components within normal limits  COMPREHENSIVE METABOLIC PANEL - Abnormal; Notable for the following components:   Glucose, Bld 138 (*)    BUN 53 (*)    Creatinine, Ser 2.69 (*)    Total Protein 8.4 (*)    Albumin 3.3 (*)    AST 358 (*)    ALT 227 (*)    Alkaline Phosphatase 136 (*)    Total Bilirubin 4.1 (*)    GFR calc non Af Amer 18 (*)    GFR calc Af Amer 21 (*)    All other components within normal limits  LIPASE, BLOOD - Abnormal; Notable for the following components:   Lipase 91 (*)    All other components within normal limits  BRAIN NATRIURETIC PEPTIDE - Abnormal; Notable for the following components:   B Natriuretic Peptide 331.9 (*)    All other components within normal limits  URINALYSIS, ROUTINE W REFLEX MICROSCOPIC  I-STAT TROPONIN, ED  I-STAT TROPONIN, ED    EKG EKG Interpretation  Date/Time:  Sunday June 30 2018 17:55:42 EST Ventricular Rate:  173 PR Interval:    QRS Duration: 85 QT Interval:  292 QTC Calculation: 496 R Axis:   147 Text Interpretation:  Atrial fibrillation with rapid V-rate Right axis deviation No significant change since last tracing Confirmed by Duffy Bruce [id number]) on 06/30/2018 6:02:55 PM   Radiology Dg Chest Portable 1 View  Result Date: 06/30/2018 CLINICAL DATA:  Chest pain EXAM: PORTABLE CHEST 1 VIEW COMPARISON:  May 18, 2018 FINDINGS: Stable cardiomegaly. No pneumothorax. No pulmonary nodules or masses. No focal infiltrate. No overt edema. IMPRESSION: Stable cardiomegaly.  No other acute abnormalities identified. Electronically Signed   By: Dorise Bullion III M.D   On: 06/30/2018 19:11   Procedures .Critical Care Performed by: Romie Jumper, PA-C Authorized by: Romie Jumper, PA-C   Critical care provider statement:    Critical care start time:  06/30/2018 6:10 PM   Critical care end time:  06/30/2018 10:55 PM   Critical care was necessary to treat or prevent  imminent or life-threatening deterioration of the following conditions: Atrial fibrillation with RVR. Initial HR > 170. Accompanying complaints of chest pain and SOB. Also presents with acute HF symptoms that require IV diuresis.   Critical care  was time spent personally by me on the following activities:  Blood draw for specimens, development of treatment plan with patient or surrogate, discussions with consultants, discussions with primary provider, evaluation of patient's response to treatment, examination of patient, re-evaluation of patient's condition, review of old charts, ordering and review of laboratory studies, ordering and review of radiographic studies and obtaining history from patient or surrogate   I assumed direction of critical care for this patient from another provider in my specialty: no   Comments:     HR controlled after Cardizem bolus of 15mg  x1 followed by drip at 15mg /hr and IV Lopressor 5mg  x1. Received IV Lasix 40mg  x1 for diuresis.   (including critical care time)  Medications Ordered in ED Medications  diltiazem (CARDIZEM) 100 mg in dextrose 5% 180mL (1 mg/mL) infusion (5 mg/hr Intravenous New Bag/Given 06/30/18 1840)  furosemide (LASIX) injection 40 mg (40 mg Intravenous Given 06/30/18 1826)  diltiazem (CARDIZEM) injection 15 mg (15 mg Intravenous Given 06/30/18 1857)     Initial Impression / Assessment and Plan / ED Course  Triage vital signs and the nursing notes have been reviewed.  Pertinent labs & imaging results that were available during care of the patient were reviewed and considered in medical decision making (see chart for details).  Patient presents with initial complaint of abdominal pain followed by chest pain, SOB, edema and a-fib which began 2 days after the abdominal pain. Patient is currently in distress and arrived via EMS with a HR of > 180 bpm. EKG done in triage shows a-fib with RVR. Physical exam concerning of an acute intra-abdominal process  such as pancreatitis or diverticulitis given her left sided abdominal pain. She also has findings consistent with acute HF and will work her up for that as well. Treatment will be initiated with IV Cardizem for HR control and IV Lasix for diuresis while other labs and imaging are pending.  Clinical Course as of Jul 01 2351  Sun Jun 30, 2018  1809 EKG shows a-fib with RVR. Will start Cardizem 15mg  bolus followed by drip starting at 5mg /hr ordered for rapid HR control. Will reassess pt's HR after admin. Also started IV Lasix 40mg  x1 to help with diuresis.   [GM]  1815 Case discussed with Dr. Duffy Bruce. Hospital admission is likely given pt's current clinical presentation. Waiting on labs to determine clear etiology to complaints.   [GM]  1913 Elevated BNP suggestive of acute on chronic heart failure. Also has worsening kidney function and LFTs since admission last month. UA normal.   [GM]  1928 CXR shows stable cardiomegaly. No acute changes such as vascular congestion, PNA or pulmonary edema.   [GM]  2056 Patient re-evaluated HR has decreased to 150s with Cardizem drip that has been titrated to 15mg /hr.  Will add IV Lopressor 5mg  x1 for additional assistance with rate control.   [GM]  2251 Patient reassessed and HR has decreased to mid-100s after IV Lopressor. CT abd/pelvis significant for ascites. No evidence of diverticulitis or other specific abdominal findings to cause abdominal pain. Consult placed to Dr. Terrence Dupont for admission for acute on chronic heart failure.   [GM]  2337 Re-evaluated patient who endorses improved chest pain as HR declines.   [GM]  2351 Case discussed with Dr. Doylene Canard who will admit the patient.   [GM]    Clinical Course User Index [GM] Taysen Bushart, Jonelle Sports, PA-C    Final Clinical Impressions(s) / ED Diagnoses  1. Acute on Chronic Heart Failure. Received  IV Lasix 40mg  x1 which she has tolerated well. 2. Atrial Fibrillation with RVR. HR initially > 170 bpm.  Received Cardizem 15mg  bolus x1 followed by Cardizem drip at 15mg /hr. HR still in 150s and eventually dropped to 90s-100s with IV Lopressor 5mg  x1.  Dispo: Admit. Case discussed with Dr. Doylene Canard.  Final diagnoses:  Acute on chronic diastolic heart failure [facility])  Atrial fibrillation with RVR [facility])    ED Discharge Orders    None        Junita Push 06/30/18 2352    Duffy Bruce, MD 07/02/18 [phone]

## 2018-07-01 ENCOUNTER — Inpatient Hospital Stay (HOSPITAL_COMMUNITY): Payer: Medicare HMO

## 2018-07-01 ENCOUNTER — Encounter (HOSPITAL_COMMUNITY): Payer: Self-pay | Admitting: *Deleted

## 2018-07-01 DIAGNOSIS — Z7902 Long term (current) use of antithrombotics/antiplatelets: Secondary | ICD-10-CM | POA: Diagnosis not present

## 2018-07-01 DIAGNOSIS — R011 Cardiac murmur, unspecified: Secondary | ICD-10-CM | POA: Diagnosis present

## 2018-07-01 DIAGNOSIS — N183 Chronic kidney disease, stage 3 (moderate): Secondary | ICD-10-CM | POA: Diagnosis present

## 2018-07-01 DIAGNOSIS — I5031 Acute diastolic (congestive) heart failure: Secondary | ICD-10-CM | POA: Diagnosis present

## 2018-07-01 DIAGNOSIS — J449 Chronic obstructive pulmonary disease, unspecified: Secondary | ICD-10-CM | POA: Diagnosis present

## 2018-07-01 DIAGNOSIS — Z8249 Family history of ischemic heart disease and other diseases of the circulatory system: Secondary | ICD-10-CM | POA: Diagnosis not present

## 2018-07-01 DIAGNOSIS — I5023 Acute on chronic systolic (congestive) heart failure: Secondary | ICD-10-CM | POA: Diagnosis present

## 2018-07-01 DIAGNOSIS — I5033 Acute on chronic diastolic (congestive) heart failure: Secondary | ICD-10-CM | POA: Diagnosis present

## 2018-07-01 DIAGNOSIS — I2510 Atherosclerotic heart disease of native coronary artery without angina pectoris: Secondary | ICD-10-CM | POA: Diagnosis present

## 2018-07-01 DIAGNOSIS — I509 Heart failure, unspecified: Secondary | ICD-10-CM | POA: Diagnosis present

## 2018-07-01 DIAGNOSIS — M1990 Unspecified osteoarthritis, unspecified site: Secondary | ICD-10-CM | POA: Diagnosis present

## 2018-07-01 DIAGNOSIS — Z882 Allergy status to sulfonamides status: Secondary | ICD-10-CM | POA: Diagnosis not present

## 2018-07-01 DIAGNOSIS — K219 Gastro-esophageal reflux disease without esophagitis: Secondary | ICD-10-CM | POA: Diagnosis present

## 2018-07-01 DIAGNOSIS — E785 Hyperlipidemia, unspecified: Secondary | ICD-10-CM | POA: Diagnosis present

## 2018-07-01 DIAGNOSIS — R188 Other ascites: Secondary | ICD-10-CM | POA: Diagnosis present

## 2018-07-01 DIAGNOSIS — Z90710 Acquired absence of both cervix and uterus: Secondary | ICD-10-CM | POA: Diagnosis not present

## 2018-07-01 DIAGNOSIS — E662 Morbid (severe) obesity with alveolar hypoventilation: Secondary | ICD-10-CM | POA: Diagnosis present

## 2018-07-01 DIAGNOSIS — K7200 Acute and subacute hepatic failure without coma: Secondary | ICD-10-CM | POA: Diagnosis present

## 2018-07-01 DIAGNOSIS — Z79899 Other long term (current) drug therapy: Secondary | ICD-10-CM | POA: Diagnosis not present

## 2018-07-01 DIAGNOSIS — I130 Hypertensive heart and chronic kidney disease with heart failure and stage 1 through stage 4 chronic kidney disease, or unspecified chronic kidney disease: Secondary | ICD-10-CM | POA: Diagnosis present

## 2018-07-01 DIAGNOSIS — I482 Chronic atrial fibrillation, unspecified: Secondary | ICD-10-CM | POA: Diagnosis present

## 2018-07-01 DIAGNOSIS — E1122 Type 2 diabetes mellitus with diabetic chronic kidney disease: Secondary | ICD-10-CM | POA: Diagnosis present

## 2018-07-01 DIAGNOSIS — Z794 Long term (current) use of insulin: Secondary | ICD-10-CM | POA: Diagnosis not present

## 2018-07-01 DIAGNOSIS — Z86718 Personal history of other venous thrombosis and embolism: Secondary | ICD-10-CM | POA: Diagnosis not present

## 2018-07-01 DIAGNOSIS — Z6841 Body Mass Index (BMI) 40.0 and over, adult: Secondary | ICD-10-CM | POA: Diagnosis not present

## 2018-07-01 LAB — HEMOGLOBIN A1C
Hgb A1c MFr Bld: 8.1 % — ABNORMAL HIGH (ref 4.8–5.6)
Mean Plasma Glucose: 185.77 mg/dL

## 2018-07-01 LAB — TROPONIN I
Troponin I: <0.03 ng/mL (ref <0.03)
Troponin I: <0.03 ng/mL (ref <0.03)

## 2018-07-01 LAB — GLUCOSE, CAPILLARY: Glucose-Capillary: 181 mg/dL — ABNORMAL HIGH (ref 70–99)

## 2018-07-01 LAB — MRSA PCR SCREENING: MRSA by PCR: NEGATIVE

## 2018-07-01 MED ORDER — METOPROLOL TARTRATE 50 MG PO TABS
50.0000 mg | ORAL_TABLET | ORAL | Status: AC
  Administered 2018-07-01: 50 mg via ORAL
  Filled 2018-07-01: qty 1

## 2018-07-01 MED ORDER — LINAGLIPTIN 5 MG PO TABS
5.0000 mg | ORAL_TABLET | Freq: Every day | ORAL | Status: DC
  Filled 2018-07-01: qty 1

## 2018-07-01 MED ORDER — DABIGATRAN ETEXILATE MESYLATE 150 MG PO CAPS
150.0000 mg | ORAL_CAPSULE | Freq: Two times a day (BID) | ORAL | Status: DC
  Administered 2018-07-01 – 2018-07-04 (×7): 150 mg via ORAL
  Filled 2018-07-01 (×8): qty 1

## 2018-07-01 MED ORDER — ROSUVASTATIN CALCIUM 10 MG PO TABS
10.0000 mg | ORAL_TABLET | Freq: Every day | ORAL | Status: DC
  Administered 2018-07-01 – 2018-07-04 (×4): 10 mg via ORAL
  Filled 2018-07-01 (×4): qty 1

## 2018-07-01 MED ORDER — METOPROLOL TARTRATE 25 MG PO TABS
50.0000 mg | ORAL_TABLET | Freq: Two times a day (BID) | ORAL | Status: DC
  Administered 2018-07-01: 50 mg via ORAL
  Filled 2018-07-01: qty 2

## 2018-07-01 MED ORDER — ACETAMINOPHEN 325 MG PO TABS
650.0000 mg | ORAL_TABLET | Freq: Four times a day (QID) | ORAL | Status: DC | PRN
  Administered 2018-07-01 – 2018-07-04 (×5): 650 mg via ORAL
  Filled 2018-07-01 (×5): qty 2

## 2018-07-01 MED ORDER — DULOXETINE HCL 60 MG PO CPEP
60.0000 mg | ORAL_CAPSULE | Freq: Every day | ORAL | Status: DC
  Administered 2018-07-01 – 2018-07-04 (×4): 60 mg via ORAL
  Filled 2018-07-01 (×4): qty 1

## 2018-07-01 MED ORDER — NITROGLYCERIN 0.4 MG SL SUBL: 0.4000 mg | SUBLINGUAL_TABLET | SUBLINGUAL | Status: DC | PRN

## 2018-07-01 MED ORDER — SIMETHICONE 80 MG PO CHEW
80.0000 mg | CHEWABLE_TABLET | Freq: Four times a day (QID) | ORAL | Status: DC | PRN
  Filled 2018-07-01: qty 1

## 2018-07-01 MED ORDER — INSULIN ASPART 100 UNIT/ML ~~LOC~~ SOLN
0.0000 [IU] | Freq: Three times a day (TID) | SUBCUTANEOUS | Status: DC
  Administered 2018-07-01 – 2018-07-02 (×2): 2 [IU] via SUBCUTANEOUS
  Administered 2018-07-02: 1 [IU] via SUBCUTANEOUS
  Administered 2018-07-02: 2 [IU] via SUBCUTANEOUS
  Administered 2018-07-03: 3 [IU] via SUBCUTANEOUS
  Administered 2018-07-03: 1 [IU] via SUBCUTANEOUS
  Administered 2018-07-03: 3 [IU] via SUBCUTANEOUS
  Administered 2018-07-04: 2 [IU] via SUBCUTANEOUS
  Administered 2018-07-04: 3 [IU] via SUBCUTANEOUS

## 2018-07-01 MED ORDER — SODIUM CHLORIDE 0.9 % IV SOLN: 250.0000 mL | INTRAVENOUS | Status: DC | PRN

## 2018-07-01 MED ORDER — ONDANSETRON HCL 4 MG/2ML IJ SOLN: 4.0000 mg | Freq: Four times a day (QID) | INTRAMUSCULAR | Status: DC | PRN

## 2018-07-01 MED ORDER — DIGOXIN 0.25 MG/ML IJ SOLN
0.2500 mg | Freq: Once | INTRAMUSCULAR | Status: AC
  Administered 2018-07-01: 0.25 mg via INTRAVENOUS
  Filled 2018-07-01: qty 2

## 2018-07-01 MED ORDER — ALBUTEROL SULFATE (2.5 MG/3ML) 0.083% IN NEBU: 2.5000 mg | INHALATION_SOLUTION | RESPIRATORY_TRACT | Status: DC | PRN

## 2018-07-01 MED ORDER — GABAPENTIN 300 MG PO CAPS
300.0000 mg | ORAL_CAPSULE | Freq: Two times a day (BID) | ORAL | Status: DC
  Administered 2018-07-01 – 2018-07-04 (×7): 300 mg via ORAL
  Filled 2018-07-01 (×7): qty 1

## 2018-07-01 MED ORDER — FUROSEMIDE 10 MG/ML IJ SOLN
40.0000 mg | Freq: Two times a day (BID) | INTRAMUSCULAR | Status: DC
  Administered 2018-07-01 – 2018-07-04 (×6): 40 mg via INTRAVENOUS
  Filled 2018-07-01 (×6): qty 4

## 2018-07-01 MED ORDER — HEPARIN SODIUM (PORCINE) 5000 UNIT/ML IJ SOLN: 5000.0000 [IU] | Freq: Three times a day (TID) | INTRAMUSCULAR | Status: DC

## 2018-07-01 MED ORDER — FUROSEMIDE 10 MG/ML IJ SOLN
80.0000 mg | Freq: Two times a day (BID) | INTRAMUSCULAR | Status: DC
  Administered 2018-07-01 (×2): 80 mg via INTRAVENOUS
  Filled 2018-07-01 (×2): qty 8

## 2018-07-01 MED ORDER — SODIUM CHLORIDE 0.9% FLUSH: 3.0000 mL | INTRAVENOUS | Status: DC | PRN

## 2018-07-01 MED ORDER — METOPROLOL TARTRATE 5 MG/5ML IV SOLN: 2.5000 mg | Freq: Four times a day (QID) | INTRAVENOUS | Status: DC

## 2018-07-01 MED ORDER — METOPROLOL TARTRATE 5 MG/5ML IV SOLN
2.5000 mg | INTRAVENOUS | Status: DC | PRN
  Administered 2018-07-01: 2.5 mg via INTRAVENOUS
  Filled 2018-07-01: qty 5

## 2018-07-01 MED ORDER — SODIUM CHLORIDE 0.9% FLUSH
3.0000 mL | Freq: Two times a day (BID) | INTRAVENOUS | Status: DC
  Administered 2018-07-01 – 2018-07-04 (×6): 3 mL via INTRAVENOUS

## 2018-07-01 MED ORDER — PANTOPRAZOLE SODIUM 40 MG PO TBEC
40.0000 mg | DELAYED_RELEASE_TABLET | Freq: Two times a day (BID) | ORAL | Status: DC
  Administered 2018-07-01 – 2018-07-04 (×7): 40 mg via ORAL
  Filled 2018-07-01 (×7): qty 1

## 2018-07-01 MED ORDER — CYCLOSPORINE 0.05 % OP EMUL
1.0000 [drp] | Freq: Two times a day (BID) | OPHTHALMIC | Status: DC
  Administered 2018-07-01 – 2018-07-04 (×6): 1 [drp] via OPHTHALMIC
  Filled 2018-07-01 (×8): qty 1

## 2018-07-01 MED ORDER — DILTIAZEM HCL-DEXTROSE 100-5 MG/100ML-% IV SOLN (PREMIX)
5.0000 mg/h | INTRAVENOUS | Status: DC
  Administered 2018-07-01 (×2): 15 mg/h via INTRAVENOUS
  Filled 2018-07-01 (×3): qty 100

## 2018-07-01 NOTE — H&P (Signed)
Referring Physician: Charolette Forward, MD  [name] is an 62 y.o. female.                       Chief Complaint: Chest pain and abdominal pain  HPI: 62 year old female with PMH of chest pain, diastolic heart failure, COPD, diabetes mellitus, DVT, hypertension, chronic atrial fibrillation, hyperlipidemia, morbid obesity, Sleep apnea has shortness of breath x 4 days, leg edema x 1 week, 10 pound weight gain in 10 days and abdominal pain with ascites. Patient had renal dysfunction last admission and was told to drink extra fluids. She was also found to be in atrial fibrillation with RVR, responding to IV diltiazem drip.  Past Medical History:  Diagnosis Date  . Allergic rhinitis   . Arthritis   . Asthma   . Chest pain 12/27/2017  . Chronic diastolic CHF (congestive heart failure) ([place])   . COPD (chronic obstructive pulmonary disease) ([place])   . Depression   . DM (diabetes mellitus) ([place])   . DVT (deep vein thrombosis) in pregnancy   . Dysrhythmia    atrial fibrilation  . GERD (gastroesophageal reflux disease)   . Headache(784.0)   . HTN (hypertension)   . Hyperlipidemia   . Obesity   . Pneumonia 04/2018   RIGHT LOBE  . Shortness of breath   . Sleep apnea    compliant with CPAP      Past Surgical History:  Procedure Laterality Date  . ABDOMINAL HYSTERECTOMY    . CATARACT EXTRACTION, BILATERAL    . CHOLECYSTECTOMY    . COLONOSCOPY WITH PROPOFOL N/A 03/05/2015   Procedure: COLONOSCOPY WITH PROPOFOL;  Surgeon: Carol Ada, MD;  Location: [facility];  Service: Endoscopy;  Laterality: N/A;  . DIAGNOSTIC LAPAROSCOPY    . ingrown hallux Left   . KNEE SURGERY    . LEFT HEART CATH AND CORONARY ANGIOGRAPHY N/A 12/31/2017   Procedure: LEFT HEART CATH AND CORONARY ANGIOGRAPHY;  Surgeon: Charolette Forward, MD;  Location: [facility];  Service: Cardiovascular;  Laterality: N/A;    Family History  Problem Relation Age of Onset  . Cancer Mother   . Hypertension Mother   .  Cancer Father   . CAD Unknown   . Hypertension Sister   . Hypertension Brother    Social History:  reports that she has never smoked. She has never used smokeless tobacco. She reports that she does not drink alcohol or use drugs.  Allergies:  Allergies  Allergen Reactions  . Sulfa Antibiotics Itching     (Not in a hospital admission)  Results for orders placed or performed during the hospital encounter of 06/30/18 (from the past 48 hour(s))  CBC with Differential     Status: Abnormal   Collection Time: 06/30/18  6:07 PM  Result Value Ref Range   WBC 13.2 (H) 4.0 - 10.5 K/uL   RBC 4.80 3.87 - 5.11 MIL/uL   Hemoglobin 12.3 12.0 - 15.0 g/dL   HCT 40.9 36.0 - 46.0 %   MCV 85.2 80.0 - 100.0 fL   MCH 25.6 (L) 26.0 - 34.0 pg   MCHC 30.1 30.0 - 36.0 g/dL   RDW 18.2 (H) 11.5 - 15.5 %   Platelets 252 150 - 400 K/uL   nRBC 0.2 0.0 - 0.2 %   Neutrophils Relative % 68 %   Neutro Abs 9.0 (H) 1.7 - 7.7 K/uL   Lymphocytes Relative 17 %   Lymphs Abs 2.3 0.7 - 4.0  K/uL   Monocytes Relative 13 %   Monocytes Absolute 1.8 (H) 0.1 - 1.0 K/uL   Eosinophils Relative 1 %   Eosinophils Absolute 0.1 0.0 - 0.5 K/uL   Basophils Relative 0 %   Basophils Absolute 0.0 0.0 - 0.1 K/uL   Immature Granulocytes 1 %   Abs Immature Granulocytes 0.06 0.00 - 0.07 K/uL    Comment: Performed at [facility] [address], [place], [place] [address]  Comprehensive metabolic panel     Status: Abnormal   Collection Time: 06/30/18  6:07 PM  Result Value Ref Range   Sodium 136 135 - 145 mmol/L   Potassium 3.8 3.5 - 5.1 mmol/L   Chloride 98 98 - 111 mmol/L   CO2 25 22 - 32 mmol/L   Glucose, Bld 138 (H) 70 - 99 mg/dL   BUN 53 (H) 8 - 23 mg/dL   Creatinine, Ser 2.69 (H) 0.44 - 1.00 mg/dL   Calcium 9.2 8.9 - 10.3 mg/dL   Total Protein 8.4 (H) 6.5 - 8.1 g/dL   Albumin 3.3 (L) 3.5 - 5.0 g/dL   AST 358 (H) 15 - 41 U/L   ALT 227 (H) 0 - 44 U/L   Alkaline Phosphatase 136 (H) 38 - 126 U/L   Total Bilirubin  4.1 (H) 0.3 - 1.2 mg/dL   GFR calc non Af Amer 18 (L) >60 mL/min   GFR calc Af Amer 21 (L) >60 mL/min    Comment: (NOTE) The eGFR has been calculated using the CKD EPI equation. This calculation has not been validated in all clinical situations. eGFR's persistently <60 mL/min signify possible Chronic Kidney Disease.    Anion gap 13 5 - 15    Comment: Performed at [facility] [address], [place], [place] [address]  Lipase, blood     Status: Abnormal   Collection Time: 06/30/18  6:07 PM  Result Value Ref Range   Lipase 91 (H) 11 - 51 U/L    Comment: Performed at [facility] [address], [place], [place] [address]  Brain natriuretic peptide     Status: Abnormal   Collection Time: 06/30/18  6:09 PM  Result Value Ref Range   B Natriuretic Peptide 331.9 (H) 0.0 - 100.0 pg/mL    Comment: Performed at [facility] [address], [place], [place] [address]  I-Stat Troponin, ED (not at [facility])     Status: None   Collection Time: 06/30/18  6:14 PM  Result Value Ref Range   Troponin i, poc 0.04 0.00 - 0.08 ng/mL   Comment 3            Comment: Due to the release kinetics of cTnI, a negative result within the first hours of the onset of symptoms does not rule out myocardial infarction with certainty. If myocardial infarction is still suspected, repeat the test at appropriate intervals.   Urinalysis, Routine w reflex microscopic     Status: None   Collection Time: 06/30/18  7:32 PM  Result Value Ref Range   Color, Urine YELLOW YELLOW   APPearance CLEAR CLEAR   Specific Gravity, Urine 1.010 1.005 - 1.030   pH 5.0 5.0 - 8.0   Glucose, UA NEGATIVE NEGATIVE mg/dL   Hgb urine dipstick NEGATIVE NEGATIVE   Bilirubin Urine NEGATIVE NEGATIVE   Ketones, ur NEGATIVE NEGATIVE mg/dL   Protein, ur NEGATIVE NEGATIVE mg/dL   Nitrite NEGATIVE NEGATIVE   Leukocytes, UA NEGATIVE NEGATIVE  Comment: Performed at [facility], [place] [address], [place], [place] [address]    Ct Abdomen Pelvis Wo Contrast  Result Date: 06/30/2018 CLINICAL DATA:  Left-sided abdominal pain.  Nausea. EXAM: CT ABDOMEN AND PELVIS WITHOUT CONTRAST TECHNIQUE: Multidetector CT imaging of the abdomen and pelvis was performed following the standard protocol without IV contrast. COMPARISON:  Dec 26, 2017 FINDINGS: Lower chest: Cardiomegaly.  Lung bases otherwise unremarkable. Hepatobiliary: Previous cholecystectomy.  No other abnormalities. Pancreas: Unremarkable. No pancreatic ductal dilatation or surrounding inflammatory changes. Spleen: Normal in size without focal abnormality. Adrenals/Urinary Tract: Adrenal glands are normal. No renal stones or masses. No hydronephrosis. No ureterectasis or ureteral stones. The bladder is unremarkable. Stomach/Bowel: The stomach and small bowel are normal. The colon is normal. The appendix is not well visualized but there is no secondary evidence of appendicitis. Vascular/Lymphatic: No significant vascular findings are present. No enlarged abdominal or pelvic lymph nodes. Reproductive: Patient is status post hysterectomy. The low-attenuation lesion in the left ovary is stable since 2011, suggesting a benign process. Other: Increased attenuation in the subcutaneous fat diffusely is increased suggesting volume overload. There is new ascites adjacent to the liver and spleen, in the pericolic gutters, extending into the pelvis. Musculoskeletal: No acute or significant osseous findings. IMPRESSION: 1. New ascites. Given the subcutaneous edema, the findings could be due to volume overload. Other causes of ascites are possible. 2. Stable low-attenuation lesion in the left ovary. Stability since 2011 suggested a benign process. Electronically Signed   By: Dorise Bullion III M.D   On: 06/30/2018 22:46   Dg Chest Portable 1 View  Result Date: 06/30/2018 CLINICAL DATA:  Chest pain EXAM: PORTABLE CHEST 1 VIEW COMPARISON:  May 18, 2018 FINDINGS: Stable cardiomegaly. No  pneumothorax. No pulmonary nodules or masses. No focal infiltrate. No overt edema. IMPRESSION: Stable cardiomegaly.  No other acute abnormalities identified. Electronically Signed   By: Dorise Bullion III M.D   On: 06/30/2018 19:11    Review Of Systems Constitutional: No fever, chills, positive weight gain. Eyes: No vision change, wears glasses. No discharge or pain. Ears: No hearing loss, No tinnitus. Respiratory: No asthma, COPD, pneumonias. Positive shortness of breath. No hemoptysis. Cardiovascular: Positive chest pain, palpitation, leg edema. Gastrointestinal: No nausea, vomiting, diarrhea, constipation. No GI bleed. No hepatitis. Genitourinary: No dysuria, hematuria, kidney stone. No incontinance. Neurological: No headache, stroke, seizures.  Psychiatry: No psych facility admission for anxiety, depression, suicide. No detox. Skin: No rash. Musculoskeletal: positive joint pain, fibromyalgia, neck pain, back pain. Lymphadenopathy: No lymphadenopathy. Hematology: No anemia or easy bruising.   Blood pressure (!) 116/95, pulse (!) 121, temperature (!) 97.3 F (36.3 C), temperature source Oral, resp. rate 20, height _0  (1.702 m), weight (!) 161.5 kg, SpO2 96 %. Body mass index is 55.76 kg/m. General appearance: alert, cooperative, appears stated age and no distress Head: Normocephalic, atraumatic. Eyes: Brown eyes, pink conjunctiva, corneas clear. PERRL, EOM's intact. Neck: No adenopathy, no carotid bruit, full JVD, supple, symmetrical, trachea midline and thyroid not enlarged. Resp: Clear to auscultation bilaterally. Cardio: Irregular rate and rhythm, S1, S2 normal, II/VI systolic murmur. GI: Soft, distended, non-tender; bowel sounds normal; no organomegaly. Extremities: 2 + edema, cyanosis or clubbing. Skin: Warm and dry.  Neurologic: Alert and oriented X 3, normal strength. Normal coordination.  Assessment/Plan Acute left heart diastolic failure, HFpEF Atrial fibrillation  with RVR, CHA2DS2VASc score of 4 Chest pain Non obstructive CAD Hypertension Hyperlipidemia CKD, III Obstructive sleep apnea Obesity hypoventilation syndrome Type 2  DM Ascites  Admit IV lasix. IV diltiazem. Home medications. Echocardiogram.  Birdie Riddle, MD  07/01/2018, 12:34 AM

## 2018-07-01 NOTE — ED Notes (Signed)
Ordered bfast tray 

## 2018-07-01 NOTE — Progress Notes (Signed)
Subjective:  Patient denies any chest pain states breathing is improved. Remains in A. Fib with rapid ventricular response  Objective:  Vital Signs in the last 24 hours: Temp:  [97.3 F (36.3 C)-97.8 F (36.6 C)] 97.8 F (36.6 C) (11/11 0110) Pulse Rate:  [31-128] 109 (11/11 1247) Resp:  [16-99] 20 (11/11 1247) BP: (103-163)/(65-121) 110/74 (11/11 1247) SpO2:  [92 %-100 %] 98 % (11/11 1247) Weight:  [161.5 kg] 161.5 kg (11/10 1805)  Intake/Output from previous day: 11/10 0701 - 11/11 0700 In: 132.9 [I.V.:132.9] Out: 2000 [Urine:2000] Intake/Output from this shift: Total I/O In: 360 [P.O.:360] Out: 1300 [Urine:1300]  Physical Exam: Neck: no adenopathy, no carotid bruit, no JVD and supple, symmetrical, trachea midline Lungs: decreased breath sound at bases with very faint rales Heart: irregularly irregular rhythm, S1, S2 normal and 2/6 systolic murmur noted Abdomen: soft, non-tender; bowel sounds normal; no masses,  no organomegaly Extremities: extremities normal, atraumatic, no cyanosis or edema  Lab Results: Recent Labs    06/30/18 1807  WBC 13.2*  HGB 12.3  PLT 252   Recent Labs    06/30/18 1807  NA 136  K 3.8  CL 98  CO2 25  GLUCOSE 138*  BUN 53*  CREATININE 2.69*   Recent Labs    07/01/18 0555  TROPONINI <0.03   Hepatic Function Panel Recent Labs    06/30/18 1807  PROT 8.4*  ALBUMIN 3.3*  AST 358*  ALT 227*  ALKPHOS 136*  BILITOT 4.1*   No results for input(s): CHOL in the last 72 hours. No results for input(s): PROTIME in the last 72 hours.  Imaging: Imaging results have been reviewed and Ct Abdomen Pelvis Wo Contrast  Result Date: 06/30/2018 CLINICAL DATA:  Left-sided abdominal pain.  Nausea. EXAM: CT ABDOMEN AND PELVIS WITHOUT CONTRAST TECHNIQUE: Multidetector CT imaging of the abdomen and pelvis was performed following the standard protocol without IV contrast. COMPARISON:  Dec 26, 2017 FINDINGS: Lower chest: Cardiomegaly.  Lung bases  otherwise unremarkable. Hepatobiliary: Previous cholecystectomy.  No other abnormalities. Pancreas: Unremarkable. No pancreatic ductal dilatation or surrounding inflammatory changes. Spleen: Normal in size without focal abnormality. Adrenals/Urinary Tract: Adrenal glands are normal. No renal stones or masses. No hydronephrosis. No ureterectasis or ureteral stones. The bladder is unremarkable. Stomach/Bowel: The stomach and small bowel are normal. The colon is normal. The appendix is not well visualized but there is no secondary evidence of appendicitis. Vascular/Lymphatic: No significant vascular findings are present. No enlarged abdominal or pelvic lymph nodes. Reproductive: Patient is status post hysterectomy. The low-attenuation lesion in the left ovary is stable since 2011, suggesting a benign process. Other: Increased attenuation in the subcutaneous fat diffusely is increased suggesting volume overload. There is new ascites adjacent to the liver and spleen, in the pericolic gutters, extending into the pelvis. Musculoskeletal: No acute or significant osseous findings. IMPRESSION: 1. New ascites. Given the subcutaneous edema, the findings could be due to volume overload. Other causes of ascites are possible. 2. Stable low-attenuation lesion in the left ovary. Stability since 2011 suggested a benign process. Electronically Signed   By: Dorise Bullion III M.D   On: 06/30/2018 22:46   Dg Chest Portable 1 View  Result Date: 06/30/2018 CLINICAL DATA:  Chest pain EXAM: PORTABLE CHEST 1 VIEW COMPARISON:  May 18, 2018 FINDINGS: Stable cardiomegaly. No pneumothorax. No pulmonary nodules or masses. No focal infiltrate. No overt edema. IMPRESSION: Stable cardiomegaly.  No other acute abnormalities identified. Electronically Signed   By: Dorise Bullion III M.D  On: 06/30/2018 19:11    Cardiac Studies:  Assessment/Plan:  Acute left heart diastolic failure, HFpEF Atrial fibrillation with RVR, CHA2DS2VASc  score of 4 Chest pain Non obstructive CAD Hypertension Hyperlipidemia Acute on chronic CKD, III Obstructive sleep apnea Obesity hypoventilation syndrome Type 2 DM Ascites Plan Start metoprolol as per orders Wean off Cardizem drip Reduce Lasix as per orders Monitor renal function  LOS: 0 days    Charolette Forward 07/01/2018, 12:55 PM

## 2018-07-01 NOTE — ED Notes (Signed)
Patient used  Bedpan , loose stool

## 2018-07-01 NOTE — Consult Note (Addendum)
   [facility] CM Inpatient Consult   07/01/2018  [name] 06/20/1956 [id number]   Made aware of hospitalization by [place]. [name] has not fully engaged with [facility] community team prior to hospitalization.  Went to bedside to speak with [name] to discuss ongoing [facility] follow up. Patient is agreeable to ongoing [facility] services. She endorses that she sleeps a lot. States around 11:30 am is a good time to call. Confirms the best contact number for her is [phone].  Explained to [name] that [facility] can assist with disease management and education for CHF. [name] states she will be more receptive to [facility] calls post hospital discharge.  Will make [place] aware that patient states she will engage with [facility].   Marthenia Rolling, MSN-Ed, RN,BSN [facility] Liaison [phone]

## 2018-07-01 NOTE — Progress Notes (Signed)
Dr Terrence Dupont called back, updated on patient's condition and VS, will give her Gabapaten early as per MD.

## 2018-07-01 NOTE — Progress Notes (Signed)
Call placed to Dr Terrence Dupont since patient is C/O abdominal pain unrelieved by Simethicone, awaiting further orders, will continue to monitor.

## 2018-07-01 NOTE — ED Notes (Signed)
Lunch tray ordered 

## 2018-07-02 ENCOUNTER — Inpatient Hospital Stay (HOSPITAL_COMMUNITY): Payer: Medicare HMO

## 2018-07-02 LAB — GLUCOSE, CAPILLARY
Glucose-Capillary: 139 mg/dL — ABNORMAL HIGH (ref 70–99)
Glucose-Capillary: 192 mg/dL — ABNORMAL HIGH (ref 70–99)
Glucose-Capillary: 198 mg/dL — ABNORMAL HIGH (ref 70–99)
Glucose-Capillary: 164 mg/dL — ABNORMAL HIGH (ref 70–99)

## 2018-07-02 LAB — BASIC METABOLIC PANEL
Anion gap: 9 (ref 5–15)
BUN: 43 mg/dL — ABNORMAL HIGH (ref 8–23)
CO2: 29 mmol/L (ref 22–32)
Calcium: 8.7 mg/dL — ABNORMAL LOW (ref 8.9–10.3)
Chloride: 99 mmol/L (ref 98–111)
Creatinine, Ser: 2.35 mg/dL — ABNORMAL HIGH (ref 0.44–1.00)
GFR calc Af Amer: 25 mL/min — ABNORMAL LOW (ref >60)
GFR calc non Af Amer: 21 mL/min — ABNORMAL LOW (ref >60)
Glucose, Bld: 164 mg/dL — ABNORMAL HIGH (ref 70–99)
Potassium: 3 mmol/L — ABNORMAL LOW (ref 3.5–5.1)
Sodium: 137 mmol/L (ref 135–145)

## 2018-07-02 LAB — ECHOCARDIOGRAM COMPLETE
Height: 67 in
Weight: 5336 oz

## 2018-07-02 LAB — MAGNESIUM: Magnesium: 1.8 mg/dL (ref 1.7–2.4)

## 2018-07-02 MED ORDER — POTASSIUM CHLORIDE CRYS ER 20 MEQ PO TBCR
40.0000 meq | EXTENDED_RELEASE_TABLET | Freq: Once | ORAL | Status: AC
  Administered 2018-07-02: 40 meq via ORAL
  Filled 2018-07-02: qty 2

## 2018-07-02 MED ORDER — METOPROLOL TARTRATE 50 MG PO TABS
50.0000 mg | ORAL_TABLET | Freq: Three times a day (TID) | ORAL | Status: DC
  Administered 2018-07-02 – 2018-07-04 (×8): 50 mg via ORAL
  Filled 2018-07-02 (×8): qty 1

## 2018-07-02 MED ORDER — METOPROLOL TARTRATE 50 MG PO TABS
50.0000 mg | ORAL_TABLET | Freq: Once | ORAL | Status: AC
  Administered 2018-07-02: 50 mg via ORAL
  Filled 2018-07-02: qty 1

## 2018-07-02 MED ORDER — POTASSIUM CHLORIDE CRYS ER 10 MEQ PO TBCR
10.0000 meq | EXTENDED_RELEASE_TABLET | Freq: Two times a day (BID) | ORAL | Status: DC
  Administered 2018-07-02 (×2): 10 meq via ORAL
  Filled 2018-07-02 (×2): qty 1

## 2018-07-02 NOTE — Discharge Instructions (Addendum)
Heart Failure °Heart failure is a condition in which the heart has trouble pumping blood because it has become weak or stiff. This means that the heart does not pump blood efficiently for the body to work well. For some people with heart failure, fluid may back up into the lungs and there may be swelling (edema) in the lower legs. Heart failure is usually a long-term (chronic) condition. It is important for you to take good care of yourself and follow the treatment plan from your health care provider. °What are the causes? °This condition is caused by some health problems, including: °· High blood pressure (hypertension). Hypertension causes the heart muscle to work harder than normal. High blood pressure eventually causes the heart to become stiff and weak. °· Coronary artery disease (CAD). CAD is the buildup of cholesterol and fat (plaques) in the arteries of the heart. °· Heart attack (myocardial infarction). Injured tissue, which is caused by the heart attack, does not contract as well and the heart's ability to pump blood is weakened. °· Abnormal heart valves. When the heart valves do not open and close properly, the heart muscle must pump harder to keep the blood flowing. °· Heart muscle disease (cardiomyopathy or myocarditis). Heart muscle disease is damage to the heart muscle from a variety of causes, such as drug or alcohol abuse, infections, or unknown causes. These can increase the risk of heart failure. °· Lung disease. When the lungs do not work properly, the heart must work harder. ° °What increases the risk? °Risk of heart failure increases as a person ages. This condition is also more likely to develop in people who: °· Are overweight. °· Are female. °· Smoke or chew tobacco. °· Abuse alcohol or illegal drugs. °· Have taken medicines that can damage the heart, such as chemotherapy drugs. °· Have diabetes. °? High blood sugar (glucose) is associated with high fat (lipid) levels in the blood. °? Diabetes  can also damage tiny blood vessels that carry nutrients to the heart muscle. °· Have abnormal heart rhythms. °· Have thyroid problems. °· Have low blood counts (anemia). ° °What are the signs or symptoms? °Symptoms of this condition include: °· Shortness of breath with activity, such as when climbing stairs. °· Persistent cough. °· Swelling of the feet, ankles, legs, or abdomen. °· Unexplained weight gain. °· Difficulty breathing when lying flat (orthopnea). °· Waking from sleep because of the need to sit up and get more air. °· Rapid heartbeat. °· Fatigue and loss of energy. °· Feeling light-headed, dizzy, or close to fainting. °· Loss of appetite. °· Nausea. °· Increased urination during the night (nocturia). °· Confusion. ° °How is this diagnosed? °This condition is diagnosed based on: °· Medical history, symptoms, and a physical exam. °· Diagnostic tests, which may include: °? Echocardiogram. °? Electrocardiogram (ECG). °? Chest X-ray. °? Blood tests. °? Exercise stress test. °? Radionuclide scans. °? Cardiac catheterization and angiogram. ° °How is this treated? °Treatment for this condition is aimed at managing the symptoms of heart failure. Medicines, behavioral changes, or other treatments may be necessary to treat heart failure. °Medicines °These may include: °· Angiotensin-converting enzyme (ACE) inhibitors. This type of medicine blocks the effects of a blood protein called angiotensin-converting enzyme. ACE inhibitors relax (dilate) the blood vessels and help to lower blood pressure. °· Angiotensin receptor blockers (ARBs). This type of medicine blocks the actions of a blood protein called angiotensin. ARBs dilate the blood vessels and help to lower blood pressure. °· Water   pills (diuretics). Diuretics cause the kidneys to remove salt and water from the blood. The extra fluid is removed through urination, leaving a lower volume of blood that the heart has to pump. °· Beta blockers. These improve heart  muscle strength and they prevent the heart from beating too quickly. °· Digoxin. This increases the force of the heartbeat. ° °Healthy behavior changes °These may include: °· Reaching and maintaining a healthy weight. °· Stopping smoking or chewing tobacco. °· Eating heart-healthy foods. °· Limiting or avoiding alcohol. °· Stopping use of street drugs (illegal drugs). °· Physical activity. ° °Other treatments °These may include: °· Surgery to open blocked coronary arteries or repair damaged heart valves. °· Placement of a biventricular pacemaker to improve heart muscle function (cardiac resynchronization therapy). This device paces both the right ventricle and left ventricle. °· Placement of a device to treat serious abnormal heart rhythms (implantable cardioverter defibrillator, or ICD). °· Placement of a device to improve the pumping ability of the heart (left ventricular assist device, or LVAD). °· Heart transplant. This can cure heart failure, and it is considered for certain patients who do not improve with other therapies. ° °Follow these instructions at home: °Medicines °· Take over-the-counter and prescription medicines only as told by your health care provider. Medicines are important in reducing the workload of your heart, slowing the progression of heart failure, and improving your symptoms. °? Do not stop taking your medicine unless your health care provider told you to do that. °? Do not skip any dose of medicine. °? Refill your prescriptions before you run out of medicine. You need your medicines every day. °Eating and drinking ° °· Eat heart-healthy foods. Talk with a dietitian to make an eating plan that is right for you. °? Choose foods that contain no trans fat and are low in saturated fat and cholesterol. Healthy choices include fresh or frozen fruits and vegetables, fish, lean meats, legumes, fat-free or low-fat dairy products, and whole-grain or high-fiber foods. °? Limit salt (sodium) if  directed by your health care provider. Sodium restriction may reduce symptoms of heart failure. Ask a dietitian to recommend heart-healthy seasonings. °? Use healthy cooking methods instead of frying. Healthy methods include roasting, grilling, broiling, baking, poaching, steaming, and stir-frying. °· Limit your fluid intake if directed by your health care provider. Fluid restriction may reduce symptoms of heart failure. °Lifestyle °· Stop smoking or using chewing tobacco. Nicotine and tobacco can damage your heart and your blood vessels. Do not use nicotine gum or patches before talking to your health care provider. °· Limit alcohol intake to no more than 1 drink per day for non-pregnant women and 2 drinks per day for men. One drink equals 12 oz of beer, 5 oz of wine, or 1½ oz of hard liquor. °? Drinking more than that is harmful to your heart. Tell your health care provider if you drink alcohol several times a week. °? Talk with your health care provider about whether any level of alcohol use is safe for you. °? If your heart has already been damaged by alcohol or you have severe heart failure, drinking alcohol should be stopped completely. °· Stop use of illegal drugs. °· Lose weight if directed by your health care provider. Weight loss may reduce symptoms of heart failure. °· Do moderate physical activity if directed by your health care provider. People who are elderly and people with severe heart failure should consult with a health care provider for physical activity recommendations. °  Monitor important information  Weigh yourself every day. Keeping track of your weight daily helps you to notice excess fluid sooner. ? Weigh yourself every morning after you urinate and before you eat breakfast. ? Wear the same amount of clothing each time you weigh yourself. ? Record your daily weight. Provide your health care provider with your weight record.  Monitor and record your blood pressure as told by your health  care provider.  Check your pulse as told by your health care provider. Dealing with extreme temperatures  If the weather is extremely hot: ? Avoid vigorous physical activity. ? Use air conditioning or fans or seek a cooler location. ? Avoid caffeine and alcohol. ? Wear loose-fitting, lightweight, and light-colored clothing.  If the weather is extremely cold: ? Avoid vigorous physical activity. ? Layer your clothes. ? Wear mittens or gloves, a hat, and a scarf when you go outside. ? Avoid alcohol. General instructions  Manage other health conditions such as hypertension, diabetes, thyroid disease, or abnormal heart rhythms as told by your health care provider.  Learn to manage stress. If you need help to do this, ask your health care provider.  Plan rest periods when fatigued.  Get ongoing education and support as needed.  Participate in or seek rehabilitation as needed to maintain or improve independence and quality of life.  Stay up to date with immunizations. Keeping current on pneumococcal and influenza immunizations is especially important to prevent respiratory infections.  Keep all follow-up visits as told by your health care provider. This is important. Contact a health care provider if:  You have a rapid weight gain.  You have increasing shortness of breath that is unusual for you.  You are unable to participate in your usual physical activities.  You tire easily.  You cough more than normal, especially with physical activity.  You have any swelling or more swelling in areas such as your hands, feet, ankles, or abdomen.  You are unable to sleep because it is hard to breathe.  You feel like your heart is beating quickly (palpitations).  You become dizzy or light-headed when you stand up. Get help right away if:  You have difficulty breathing.  You notice or your family notices a change in your awareness, such as having trouble staying awake or having  difficulty with concentration.  You have pain or discomfort in your chest.  You have an episode of fainting (syncope). This information is not intended to replace advice given to you by your health care provider. Make sure you discuss any questions you have with your health care provider. Document Released: 08/07/2005 Document Revised: 04/11/2016 Document Reviewed: 03/01/2016 Elsevier Interactive Patient Education  2018 [place] on my medicine - Pradaxa (dabigatran)  Why was Pradaxa prescribed for you? Pradaxa was prescribed for you to reduce the risk of forming blood clots that cause a stroke if you have a medical condition called atrial fibrillation (a type of irregular heartbeat).    What do you Need to know about PradAXa? Take your Pradaxa TWICE DAILY - one capsule in the morning and one tablet in the evening with or without food.  It would be best to take the doses about the same time each day.  The capsules should not be broken, chewed or opened - they must be swallowed whole.  Do not store Pradaxa in other medication containers - once the bottle is opened the Pradaxa should be used within FOUR months; throw away any capsules that havent  been by that time.  Take Pradaxa exactly as prescribed by your doctor.  DO NOT stop taking Pradaxa without talking to the doctor who prescribed the medication.  Stopping without other stroke prevention medication to take the place of Pradaxa may increase your risk of developing a clot that causes a stroke.  Refill your prescription before you run out.  After discharge, you should have regular check-up appointments with your healthcare provider that is prescribing your Pradaxa.  In the future your dose may need to be changed if your kidney function or weight changes by a significant amount.  What do you do if you miss a dose? If you miss a dose, take it as soon as you remember on the same day.  If your next dose is less than 6  hours away, skip the missed dose.  Do not take two doses of PRADAXA at the same time.  Important Safety Information A possible side effect of Pradaxa is bleeding. You should call your healthcare provider right away if you experience any of the following: ? Bleeding from an injury or your nose that does not stop. ? Unusual colored urine (red or dark brown) or unusual colored stools (red or black). ? Unusual bruising for unknown reasons. ? A serious fall or if you hit your head (even if there is no bleeding).  Some medicines may interact with Pradaxa and might increase your risk of bleeding or clotting while on Pradaxa. To help avoid this, consult your healthcare provider or pharmacist prior to using any new prescription or non-prescription medications, including herbals, vitamins, non-steroidal anti-inflammatory drugs (NSAIDs) and supplements.  This website has more information on Pradaxa (dabigatran): [email or web address]://[email or web address].com

## 2018-07-02 NOTE — Progress Notes (Signed)
Subjective:  Complains of musculoskeletal back and leg pains. Denies any chest pains states breathing has improved. Remains in A. Fib with moderate and response and occasionally RVR.  Objective:  Vital Signs in the last 24 hours: Temp:  [97.5 F (36.4 C)-98.5 F (36.9 C)] 98 F (36.7 C) (11/12 0743) Pulse Rate:  [78-122] 113 (11/12 0743) Resp:  [16-28] 21 (11/12 0743) BP: (91-136)/(61-99) 120/92 (11/12 0743) SpO2:  [95 %-100 %] 97 % (11/12 0743) Weight:  [151.3 kg] 151.3 kg (11/12 0506)  Intake/Output from previous day: 11/11 0701 - 11/12 0700 In: 772.1 [P.O.:600; I.V.:172.1] Out: 3100 [Urine:3100] Intake/Output from this shift: No intake/output data recorded.  Physical Exam: Neck: no adenopathy, no carotid bruit, no JVD and supple, symmetrical, trachea midline Lungs: decreased breath sound at bases with faint rales noted air entry improved Heart: irregularly irregular rhythm, S1, S2 normal and soft systolic murmur noted Abdomen: soft, non-tender; bowel sounds normal; no masses,  no organomegaly Extremities: extremities normal, atraumatic, no cyanosis or edema  Lab Results: Recent Labs    06/30/18 1807  WBC 13.2*  HGB 12.3  PLT 252   Recent Labs    06/30/18 1807 07/02/18 0402  NA 136 137  K 3.8 3.0*  CL 98 99  CO2 25 29  GLUCOSE 138* 164*  BUN 53* 43*  CREATININE 2.69* 2.35*   Recent Labs    07/01/18 0555 07/01/18 1228  TROPONINI <0.03 <0.03   Hepatic Function Panel Recent Labs    06/30/18 1807  PROT 8.4*  ALBUMIN 3.3*  AST 358*  ALT 227*  ALKPHOS 136*  BILITOT 4.1*   No results for input(s): CHOL in the last 72 hours. No results for input(s): PROTIME in the last 72 hours.  Imaging: Imaging results have been reviewed  Cardiac Studies:  Assessment/Plan:  Acute left heart diastolic failure, HFpEF Atrial fibrillation with RVR, CHA2DS2VASc score of 4 Chest pain Non obstructive CAD Hypertension Hyperlipidemia Acute on chronic CKD,  III Obstructive sleep apnea Obesity hypoventilation syndrome Type 2 DM Ascites Elevated LFTs Plan Increase metoprolol as per orders Repeat labs in a.m. Hepatitis panel Dr. Doylene Canard on-call until Thursday morning  LOS: 1 day    Charolette Forward 07/02/2018, 8:00 AM

## 2018-07-02 NOTE — Progress Notes (Signed)
  Echocardiogram 2D Echocardiogram has been performed.  Merrie Roof F 07/02/2018, 12:27 PM

## 2018-07-03 LAB — COMPREHENSIVE METABOLIC PANEL
ALT: 169 U/L — ABNORMAL HIGH (ref 0–44)
AST: 128 U/L — ABNORMAL HIGH (ref 15–41)
Albumin: 2.8 g/dL — ABNORMAL LOW (ref 3.5–5.0)
Alkaline Phosphatase: 131 U/L — ABNORMAL HIGH (ref 38–126)
Anion gap: 6 (ref 5–15)
BUN: 33 mg/dL — ABNORMAL HIGH (ref 8–23)
CO2: 31 mmol/L (ref 22–32)
Calcium: 8.8 mg/dL — ABNORMAL LOW (ref 8.9–10.3)
Chloride: 101 mmol/L (ref 98–111)
Creatinine, Ser: 2.1 mg/dL — ABNORMAL HIGH (ref 0.44–1.00)
GFR calc Af Amer: 28 mL/min — ABNORMAL LOW (ref >60)
GFR calc non Af Amer: 24 mL/min — ABNORMAL LOW (ref >60)
Glucose, Bld: 185 mg/dL — ABNORMAL HIGH (ref 70–99)
Potassium: 3.2 mmol/L — ABNORMAL LOW (ref 3.5–5.1)
Sodium: 138 mmol/L (ref 135–145)
Total Bilirubin: 2.5 mg/dL — ABNORMAL HIGH (ref 0.3–1.2)
Total Protein: 7.6 g/dL (ref 6.5–8.1)

## 2018-07-03 LAB — GLUCOSE, CAPILLARY
Glucose-Capillary: 229 mg/dL — ABNORMAL HIGH (ref 70–99)
Glucose-Capillary: 147 mg/dL — ABNORMAL HIGH (ref 70–99)
Glucose-Capillary: 247 mg/dL — ABNORMAL HIGH (ref 70–99)
Glucose-Capillary: 217 mg/dL — ABNORMAL HIGH (ref 70–99)

## 2018-07-03 LAB — CBC
HCT: 35.8 % — ABNORMAL LOW (ref 36.0–46.0)
Hemoglobin: 11.3 g/dL — ABNORMAL LOW (ref 12.0–15.0)
MCH: 26.5 pg (ref 26.0–34.0)
MCHC: 31.6 g/dL (ref 30.0–36.0)
MCV: 84 fL (ref 80.0–100.0)
Platelets: 182 10*3/uL (ref 150–400)
RBC: 4.26 MIL/uL (ref 3.87–5.11)
RDW: 18.2 % — ABNORMAL HIGH (ref 11.5–15.5)
WBC: 7.5 10*3/uL (ref 4.0–10.5)
nRBC: 0.3 % — ABNORMAL HIGH (ref 0.0–0.2)

## 2018-07-03 MED ORDER — INSULIN DETEMIR 100 UNIT/ML ~~LOC~~ SOLN
10.0000 [IU] | Freq: Two times a day (BID) | SUBCUTANEOUS | Status: DC
  Administered 2018-07-03 – 2018-07-04 (×2): 10 [IU] via SUBCUTANEOUS
  Filled 2018-07-03 (×4): qty 0.1

## 2018-07-03 MED ORDER — POTASSIUM CHLORIDE CRYS ER 20 MEQ PO TBCR
20.0000 meq | EXTENDED_RELEASE_TABLET | Freq: Two times a day (BID) | ORAL | Status: DC
  Administered 2018-07-03 – 2018-07-04 (×3): 20 meq via ORAL
  Filled 2018-07-03 (×3): qty 1

## 2018-07-03 NOTE — Consult Note (Signed)
Ref: Charolette Forward, MD   Subjective:  Leg edema persist. Shortness of breath persist. Claims to be moving. Echocardiogram with mild LV systolic dysfunction with severe MR and TR.  Objective:  Vital Signs in the last 24 hours: Temp:  [97.6 F (36.4 C)-99.8 F (37.7 C)] 97.8 F (36.6 C) (11/13 0519) Pulse Rate:  [68-113] 73 (11/13 0519) Cardiac Rhythm: Atrial fibrillation (11/13 0700) Resp:  [17-26] 19 (11/13 0944) BP: (96-129)/(60-95) 108/81 (11/13 0944) SpO2:  [92 %-98 %] 95 % (11/13 0519) Weight:  [152.7 kg] 152.7 kg (11/13 0519)  Physical Exam: BP Readings from Last 1 Encounters:  07/03/18 108/81     Wt Readings from Last 1 Encounters:  07/03/18 (!) 152.7 kg    Weight change: 1.452 kg Body mass index is 52.73 kg/m. HEENT: [place]/AT, Eyes-Brown, PERL, EOMI, Conjunctiva-Pink, Sclera-Non-icteric Neck: + JVD, No bruit, Trachea midline. Lungs:  Clearing, Bilateral. Cardiac:  Regular rhythm, normal S1 and S2, no S3. II/VI systolic murmur. Abdomen:  Soft, non-tender. BS present. Extremities:  2 + edema present. No cyanosis. No clubbing. CNS: AxOx3, Cranial nerves grossly intact, moves all 4 extremities.  Skin: Warm and dry.   Intake/Output from previous day: 11/12 0701 - 11/13 0700 In: 540 [P.O.:540] Out: 1900 [Urine:1900]    Lab Results: BMET    Component Value Date/Time   NA 138 07/03/2018 0432   NA 137 07/02/2018 0402   NA 136 06/30/2018 1807   K 3.2 (L) 07/03/2018 0432   K 3.0 (L) 07/02/2018 0402   K 3.8 06/30/2018 1807   CL 101 07/03/2018 0432   CL 99 07/02/2018 0402   CL 98 06/30/2018 1807   CO2 31 07/03/2018 0432   CO2 29 07/02/2018 0402   CO2 25 06/30/2018 1807   GLUCOSE 185 (H) 07/03/2018 0432   GLUCOSE 164 (H) 07/02/2018 0402   GLUCOSE 138 (H) 06/30/2018 1807   BUN 33 (H) 07/03/2018 0432   BUN 43 (H) 07/02/2018 0402   BUN 53 (H) 06/30/2018 1807   CREATININE 2.10 (H) 07/03/2018 0432   CREATININE 2.35 (H) 07/02/2018 0402   CREATININE 2.69 (H)  06/30/2018 1807   CALCIUM 8.8 (L) 07/03/2018 0432   CALCIUM 8.7 (L) 07/02/2018 0402   CALCIUM 9.2 06/30/2018 1807   GFRNONAA 24 (L) 07/03/2018 0432   GFRNONAA 21 (L) 07/02/2018 0402   GFRNONAA 18 (L) 06/30/2018 1807   GFRAA 28 (L) 07/03/2018 0432   GFRAA 25 (L) 07/02/2018 0402   GFRAA 21 (L) 06/30/2018 1807   CBC    Component Value Date/Time   WBC 7.5 07/03/2018 0432   RBC 4.26 07/03/2018 0432   HGB 11.3 (L) 07/03/2018 0432   HCT 35.8 (L) 07/03/2018 0432   PLT 182 07/03/2018 0432   MCV 84.0 07/03/2018 0432   MCH 26.5 07/03/2018 0432   MCHC 31.6 07/03/2018 0432   RDW 18.2 (H) 07/03/2018 0432   LYMPHSABS 2.3 06/30/2018 1807   MONOABS 1.8 (H) 06/30/2018 1807   EOSABS 0.1 06/30/2018 1807   BASOSABS 0.0 06/30/2018 1807   HEPATIC Function Panel Recent Labs    05/20/18 0349 06/30/18 1807 07/03/18 0432  PROT 10.1* 8.4* 7.6   HEMOGLOBIN A1C No components found for: HGA1C,  MPG CARDIAC ENZYMES Lab Results  Component Value Date   CKTOTAL 118 02/27/2012   CKMB 1.1 02/27/2012   TROPONINI <0.03 07/01/2018   TROPONINI <0.03 07/01/2018   TROPONINI <0.03 12/29/2017   BNP No results for input(s): PROBNP in the last 8760 hours. TSH No results for  input(s): TSH in the last 8760 hours. CHOLESTEROL Recent Labs    12/30/17 0436  CHOL 106    Scheduled Meds: . cycloSPORINE  1 drop Both Eyes BID  . dabigatran  150 mg Oral Q12H  . DULoxetine  60 mg Oral Daily  . furosemide  40 mg Intravenous BID  . gabapentin  300 mg Oral BID  . insulin aspart  0-9 Units Subcutaneous TID WC  . metoprolol tartrate  50 mg Oral Q8H  . pantoprazole  40 mg Oral BID  . potassium chloride  20 mEq Oral BID  . rosuvastatin  10 mg Oral Q breakfast  . sodium chloride flush  3 mL Intravenous Q12H   Continuous Infusions: . sodium chloride     PRN Meds:.sodium chloride, acetaminophen, albuterol, metoprolol tartrate, nitroGLYCERIN, ondansetron (ZOFRAN) IV, simethicone, sodium chloride  flush  Assessment/Plan: Acute left heart systolic and diastolic failure Atrial fibrillation with controlled ventricular response Chest pain Non obstructive CAD HTN Hyperlipidemia CKD, III Obstructive sleep apnea Obesity hypoventilation syndrome Type 2 DM Ascites  Continue slow diuresis. Increase activity.   LOS: 2 days    Dixie Dials  MD  07/03/2018, 9:55 AM

## 2018-07-03 NOTE — Progress Notes (Signed)
Inpatient Diabetes Program Recommendations  AACE/ADA: New Consensus Statement on Inpatient Glycemic Control (2015)  Target Ranges:  Prepandial:   less than 140 mg/dL      Peak postprandial:   less than 180 mg/dL (1-2 hours)      Critically ill patients:  140 - 180 mg/dL   Lab Results  Component Value Date   GLUCAP 147 (H) 07/03/2018   HGBA1C 8.1 (H) 07/01/2018    Review of Glycemic ControlResults for [name], [name] (MRN [id number]) as of 07/03/2018 13:27  Ref. Range 07/02/2018 07:42 07/02/2018 11:51 07/02/2018 16:27 07/02/2018 21:07 07/03/2018 07:34 07/03/2018 11:23  Glucose-Capillary Latest Ref Range: 70 - 99 mg/dL 139 (H) 192 (H) 198 (H) 164 (H) 229 (H) 147 (H)    Diabetes history: DM 2 Outpatient Diabetes medications: Levemir 30 units bid, Novolog 0-10 units SSI Current orders for Inpatient glycemic control:  Novolog sensitive tid with meals Inpatient Diabetes Program Recommendations:   Please consider adding Levemir 10 units bid.   Thanks,  Adah Perl, RN, BC-ADM Inpatient Diabetes Coordinator Pager [phone] (8a-5p)

## 2018-07-04 LAB — HEPATITIS PANEL, ACUTE
HCV Ab: 0.1 s/co ratio (ref 0.0–0.9)
Hep A IgM: NEGATIVE
Hep B C IgM: NEGATIVE
Hepatitis B Surface Ag: NEGATIVE

## 2018-07-04 LAB — BASIC METABOLIC PANEL
Anion gap: 10 (ref 5–15)
BUN: 28 mg/dL — ABNORMAL HIGH (ref 8–23)
CO2: 30 mmol/L (ref 22–32)
Calcium: 8.8 mg/dL — ABNORMAL LOW (ref 8.9–10.3)
Chloride: 97 mmol/L — ABNORMAL LOW (ref 98–111)
Creatinine, Ser: 1.99 mg/dL — ABNORMAL HIGH (ref 0.44–1.00)
GFR calc Af Amer: 30 mL/min — ABNORMAL LOW (ref >60)
GFR calc non Af Amer: 26 mL/min — ABNORMAL LOW (ref >60)
Glucose, Bld: 168 mg/dL — ABNORMAL HIGH (ref 70–99)
Potassium: 3.6 mmol/L (ref 3.5–5.1)
Sodium: 137 mmol/L (ref 135–145)

## 2018-07-04 LAB — GLUCOSE, CAPILLARY
Glucose-Capillary: 202 mg/dL — ABNORMAL HIGH (ref 70–99)
Glucose-Capillary: 158 mg/dL — ABNORMAL HIGH (ref 70–99)

## 2018-07-04 MED ORDER — INSULIN DETEMIR 100 UNIT/ML ~~LOC~~ SOLN: 15.0000 [IU] | Freq: Two times a day (BID) | SUBCUTANEOUS | 11 refills | Status: DC

## 2018-07-04 MED ORDER — POTASSIUM CHLORIDE CRYS ER 20 MEQ PO TBCR: 20.0000 meq | EXTENDED_RELEASE_TABLET | Freq: Every day | ORAL | 3 refills | Status: DC

## 2018-07-04 MED ORDER — LOSARTAN POTASSIUM 25 MG PO TABS: 25.0000 mg | ORAL_TABLET | Freq: Every day | ORAL | 3 refills | Status: DC

## 2018-07-04 MED ORDER — FUROSEMIDE 40 MG PO TABS: 40.0000 mg | ORAL_TABLET | Freq: Two times a day (BID) | ORAL | 11 refills | Status: DC

## 2018-07-04 MED ORDER — DIGOXIN 125 MCG PO TABS: 125.0000 ug | ORAL_TABLET | Freq: Every day | ORAL | 11 refills | Status: DC

## 2018-07-04 MED ORDER — METOPROLOL TARTRATE 50 MG PO TABS: 50.0000 mg | ORAL_TABLET | Freq: Three times a day (TID) | ORAL | 3 refills | Status: DC

## 2018-07-04 NOTE — Discharge Summary (Signed)
Discharge summary dictated on 07/04/2018, dictation number is [id number]

## 2018-07-04 NOTE — Progress Notes (Signed)
Patient received discharge information and acknowledged understanding of it. Patient IV was removed.  

## 2018-07-05 ENCOUNTER — Other Ambulatory Visit: Payer: Self-pay | Admitting: *Deleted

## 2018-07-05 NOTE — Discharge Summary (Signed)
[name], KIRSTEN MEDICAL RECORD [id number] ACCOUNT [id number] DATE OF BIRTH:[birth year] FACILITY: [facility] LOCATION: [facility] PHYSICIAN:Allyn Bartelson Daivd Council, MD  DISCHARGE SUMMARY  DATE OF DISCHARGE:  07/04/2018  ADMITTING DIAGNOSES: 1.  Acute left heart diastolic failure, heart failure secondary to preserved left ventricular systolic function, atrial fibrillation with rapid ventricular response, CHADS-VASc score of 4. 2.  Chest pain. 3.  Nonobstructive coronary artery disease. 4.  Hypertension. 5.  Hyperlipidemia. 6.  Chronic kidney disease stage III. 7.  Obstructive sleep apnea. 8.  Obesity hypoventilation syndrome. 9.  Type 2 diabetes mellitus. 10.  Ascites.  DISCHARGE DIAGNOSES: 1.  Compensated acute left diastolic heart failure secondary to preserved left ventricular systolic function. 2.  Chronic atrial fibrillation, CHADS-VASc score of 4 on chronic anticoagulation. 3.  Status post chest pain, myocardial infarction ruled out. 4.  Nonobstructive coronary artery disease.    5.  Hypertension. 6.  Hyperlipidemia. 7.  Acute on chronic kidney disease stage III, improving. 8.  Obstructive sleep apnea. 9.  Obesity hypoventilation syndrome. 10.  Type 2 diabetes mellitus. 11.  Morbid obesity. 12.  Elevated liver function tests secondary to shock liver, valvular heart disease/status improved. 13.  Ascites.  DISCHARGE  HOME MEDICATIONS:   1.  Digoxin 0.125 mg 1 tablet daily. 2.  Lasix 40 mg 1 tablet twice daily. 3.  Potassium chloride 20 mEq daily. 4.  Tylenol 650 mg 2 tablets every 6 hours as needed for moderate pain. 5.  Pradaxa 150 mg twice daily. 6.  Cymbalta 60 mg daily. 7.  Gabapentin 300 mg twice daily. 8.  NovoLog sliding scale as before. 9.  Multivitamin 1 tablet daily. 10.  Nitrostat sublingual 0.4 mg p.r.n. 11.  Pantoprazole 40 mg 1 tablet twice daily. 12.  ProAir HFA 2 puffs every 4 hours as needed. 13,  Restasis 0.5% ophthalmic solution as before. 14.   Losartan 25 mg 1 tablet daily. 15.  Metoprolol tartrate 50 mg 3 times daily.  The patient has been advised to stop diltiazem, Crestor and Januvia.  Levemir dose has been reduced to 15 units twice daily.  The patient has been advised to monitor blood pressure and blood sugar regularly and chart.  The patient has been extensively  counseled regarding diet, weight reduction, compliance with medication and followup.  Heart failure instructions have been given.  CONDITION AT DISCHARGE:  Stable.  BRIEF HISTORY AND HOSPITAL COURSE:  The patient is a 62 year old female with past medical history significant for nonobstructive CAD, diastolic heart failure, COPD, diabetes mellitus, history of DVT in the past, hypertension, chronic atrial fibrillation,  hyperlipidemia, morbid obesity, obstructive sleep apnea progressive increasing shortness of breath for four days associated with leg swelling for 1 week and 10-pound weight gain in 10 days associated with abdominal pain.  The patient had renal  dysfunction last admission and was told to drink extra fluids.  She was able to respond.  She was also found to be in AFib with RVR, responded to IV diltiazem drip.  PHYSICAL EXAMINATION: GENERAL:  She was alert, awake, oriented. VITAL SIGNS:  Blood pressure was 116/95, pulse 121.  Irregularly irregular.  She was afebrile. GENERAL APPEARANCE:  Alert, cooperative, appears stated age, no distress. HEENT:  Normocephalic, atraumatic. EYES:  Pupils round. EYES:  Pink conjunctivae.  Corneas are clear.  PERRLA. NECK:  No adenopathy, no carotid bruits.  Full JVD.  Neck is supple, symmetrical.  Trachea midline.  Thyroid not enlarged. LUNGS:  Clear to auscultation. CARDIOVASCULAR:  Irregularly irregular S1, S2 was normal.  There was II/VI systolic murmur. GASTROINTESTINAL:  Soft, nondistended, nontender.  Bowel sounds normal.  No organomegaly. EXTREMITIES:  There was 2+ pitting edema, no cyanosis or clubbing. NEUROLOGIC:   Grossly intact.  LABORATORY DATA:  Her admission labs, sodium was 136, potassium 3.8, glucose 138, BUN 53, creatinine 2.69.  Three sets of troponin I were negative.  BNP was elevated at 331.  Hemoglobin was 12.3, hematocrit 40.9, white count of 13.2.  Last labs,  electrolytes today, sodium 137, potassium 3.6, glucose 168, BUN 28, creatinine 1.99, which is trending down.    The patient had 2D echo done which showed a mildly depressed LV systolic function, EF of 68-11%.  There was severe MR and TR.    A hepatitis panels were negative.  Her liver enzymes are trending down.  Last AST was 128, ALT 169, bilirubin is down to 2.5.  BRIEF HOSPITAL COURSE:  The patient was admitted to step-down unit, was started on IV Lasix with good diuresis and improvement in her breathing and leg swelling.  The patient did not have any episodes of chest pain during the hospital stay.  The patient  recently had cardiac catheterization, which showed nonobstructive CAD.  The patient has been ambulating in room without any problems.    The patient has been counseled extensively regarding compliance with medication, diet, weight reduction and follow up.  If the patient continues to have recurrent episodes of symptomatic CHF, we will refer patient for evaluation to CVTS for evaluation  for possible MVR and maze procedure.  Discussed with the patient and she agrees.  AN/NUANCE D:07/04/2018 T:07/05/2018 JOB:[id number]

## 2018-07-05 NOTE — Patient Outreach (Signed)
[facility]) Care Management  07/05/2018  [name] 13-May-1956 [id number]   Notified member discharged yesterday after readmitted for heart failure. Call placed to member for transition of care.  She report she still has some swelling in her legs/feet, taking medications as prescribed.  Continue to have some shortness of breath, denies chest pain.  She has not scheduled appointment with primary MD (also her cardiologist), but will call to schedule today.  This care manager attempted to schedule home visit, however joint decision made to wait until after MD visit is scheduled as that is more important.  Will follow up next week to confirm she has MD appointment and to schedule home visit.  THN CM Care Plan Problem One     Most Recent Value  Care Plan Problem One  Risk for readmission related to complications of A-fib & heart failure as evidenced by recent hospitalization  Role Documenting the Problem One  Care Management [place] for Problem One  Active  THN Long Term Goal   Member will not be readmitted to hospital within the next 31 days as evidenced by ED/Admit report  THN Long Term Goal Start Date  07/08/18  Interventions for Problem One Long Term Goal  Most recent discharge instructions reviewed with member, including plan of care (weights, medications, MD appointments)  [facility] CM Short Term Goal #1   Member will keep and attend follow up appointment with cardiologist within the next 2 weeks  THN CM Short Term Goal #1 Start Date  07/05/18  Interventions for Short Term Goal #1  Advised to contact MD to schedule appointment  [facility] CM Short Term Goal #2   Member will weigh self daily and record over the next 4 weeks  THN CM Short Term Goal #2 Start Date  07/05/18  Interventions for Short Term Goal #2  Advised of importance of daily weights and monitoring fluid status   THN CM Short Term Goal #3  Member will report taking medications as indicated over the  next 4 weeks  THN CM Short Term Goal #3 Start Date  07/05/18  Interventions for Short Tern Goal #3  Medication changes reviewed with member, advised of importance of taking as instructed     Valente David, RN, MSN [place] Manager [phone]

## 2018-07-08 ENCOUNTER — Encounter: Payer: Self-pay | Admitting: Podiatry

## 2018-07-08 NOTE — Progress Notes (Signed)
Subjective: [name] presents today with history of neuropathy with cc of painful, mycotic toenails.  Pain is aggravated when wearing enclosed shoe gear.  Patient has peripheral neuropathy managed with gabapentin.  Her additional concerns today are increased pain in both of her feet.  Despite her, she feels like she is walking on steel pads all day.  She also would like to know about acquiring super compression hose for her lower extremity edema.  Medical History   04/2018 Pneumonia   12/27/2017 Chest pain  Date Unknown Allergic rhinitis  Date Unknown Arthritis  Date Unknown Asthma  Date Unknown Chronic diastolic CHF (congestive heart failure) (HCC)  Date Unknown COPD (chronic obstructive pulmonary disease) ([place])  Date Unknown Depression  Date Unknown DM (diabetes mellitus) ([place])  Date Unknown DVT (deep vein thrombosis) in pregnancy  Date Unknown Dysrhythmia   Date Unknown GERD (gastroesophageal reflux disease)  Date Unknown Headache(784.0)  Date Unknown HTN (hypertension)  Date Unknown Hyperlipidemia  Date Unknown Obesity  Date Unknown Shortness of breath  Date Unknown Sleep apnea    Problem List   Cardiovascular and Mediastinum  Atrial fibrillation ([place])   Chronic atrial fibrillation   Atrial fibrillation with RVR (HCC)   Acute on chronic left systolic heart failure (HCC)   Congestive heart failure with left ventricular diastolic dysfunction, acute (HCC)   Respiratory  OSA (obstructive sleep apnea)   Hypoxia   Right upper lobe pneumonia (HCC)   Digestive  GASTROPARESIS   Nausea vomiting and diarrhea   Endocrine  Diabetes mellitus ([place])   Diabetes mellitus type 2 in obese (HCC)   Other  Morbid obesity ([place])   GASTROENTERITIS   Chest pain   Diastolic dysfunction    Surgical History   12/31/2017 Left heart cath and coronary angiography (N/A)   03/05/2015 Colonoscopy with propofol (N/A)   Date Unknown Abdominal hysterectomy  Date Unknown Cataract  extraction, bilateral  Date Unknown Cholecystectomy  Date Unknown Diagnostic laparoscopy  Date Unknown ingrown hallux [Other] (Left)  Date Unknown Knee surgery   Medications    acetaminophen (TYLENOL) 325 MG tablet    dabigatran (PRADAXA) 150 MG CAPS capsule    digoxin (LANOXIN) 0.125 MG tablet    DULoxetine (CYMBALTA) 60 MG capsule    furosemide (LASIX) 40 MG tablet    gabapentin (NEURONTIN) 300 MG capsule    insulin aspart (NOVOLOG) 100 UNIT/ML injection    insulin detemir (LEVEMIR) 100 UNIT/ML injection    losartan (COZAAR) 25 MG tablet    metoprolol tartrate (LOPRESSOR) 50 MG tablet    Multiple Vitamins-Minerals (MULTIVITAMIN WOMEN PO)    nitroGLYCERIN (NITROSTAT) 0.4 MG SL tablet    pantoprazole (PROTONIX) 40 MG tablet    potassium chloride SA (K-DUR,KLOR-CON) 20 MEQ tablet    PROAIR HFA 108 (90 BASE) MCG/ACT inhaler    RESTASIS 0.05 % ophthalmic emulsion    Allergies     Sulfa AntibioticsItching   Tobacco History   Smoking Status  Never Smoker  Smokeless Tobacco Status  Never Used      Family History   Mother (Deceased) Cancer    Hypertension         Father (Deceased) Cancer         Unknown CAD         Sister Hypertension         Brother Hypertension       Review of systems: Constitutional: Denies chills fatigue fever sweats weight change Eyes: Denies diplopia glare light sensitivity Ears nose mouth throat: Denies  vertigo denies bloody nose rhinitis denies cold sores and snoring Cardiovascular: +edema, Denies chest pain tightness Respiratory: +sleep apnea, Denies difficulty breathing, denies congestion Gastrointestinal: +acid reflux, Denies abdominal pain, diarrhea, nausea, vomiting Genitourinary: Denies nocturia, pain on urination, blood in urine Musculoskeletal: Denies cramping Skin: +changes in toenails, denies color change dryness, itchy skin, mole changes, or rash  Neurological: +pedal paresthesias, Denies fainting, denies seizure, denies change  in speech.  Positive for headaches periodically Endocrine: Denies dry mouth, denies flushing, denies heat intolerance, denies cold intolerance, denies excessive thirst, denies polyuria, denies nocturia Hematological: Denies easy bleeding, excessive bleeding, easy bruising, denies enlarged lymph nodes Allergy/immunological: Denies hives denies frequent infections  Objective:  Vascular Examination: Capillary refill time is less than 3 seconds all 10 digits. Dorsalis pedis palpable bilaterally Posterior tibial pulses faintly palpable bilaterally Digital hair x 10 digits was absent Skin temperature gradient within normal limits bilaterally Bilateral lower extremity edema secondary to venous insufficiency bilaterally  Dermatological Examination: Skin noted to show signs of chronic pedal edema bilaterally  Toenails 1-5 b/l discolored, thick, dystrophic with subungual debris and pain with palpation to nailbeds due to thickness of nails.  Musculoskeletal: Muscle strength is 5/5 to all muscle groups bilaterally  Neurological: Sensation with 10 gram monofilament decreased Vibratory sensation diminished Assessment: 1. Painful onychomycosis toenails 1-5 b/l 2. Diabetes with peripheral neuropathy 3. Pedal edema secondary to chronic venous insufficiency bilaterally   Plan: 1. Discussed diabetic foot care principles with patient.  Literature dispensed to patient on today. 2. Toenails 1-5 b/l were debrided in length and girth without iatrogenic bleeding. 3. Regarding her compression hose, I asked that she consult her cardiologist to get the compression that here she would desire for her to have. 4. Regarding her increased neuropathy symptoms, I have asked her to call her primary care physician to ask for increase in her dosage of gabapentin. 5. Patient to continue soft, supportive shoe gear 6. Patient to report any pedal injuries to medical professional  7. Follow up 3 months. Patient/POA to  call should there be a concern in the interim.

## 2018-07-10 DIAGNOSIS — I4891 Unspecified atrial fibrillation: Secondary | ICD-10-CM | POA: Diagnosis not present

## 2018-07-10 DIAGNOSIS — J181 Lobar pneumonia, unspecified organism: Secondary | ICD-10-CM | POA: Diagnosis not present

## 2018-07-10 DIAGNOSIS — J440 Chronic obstructive pulmonary disease with acute lower respiratory infection: Secondary | ICD-10-CM | POA: Diagnosis not present

## 2018-07-10 DIAGNOSIS — N179 Acute kidney failure, unspecified: Secondary | ICD-10-CM | POA: Diagnosis not present

## 2018-07-10 DIAGNOSIS — E860 Dehydration: Secondary | ICD-10-CM | POA: Diagnosis not present

## 2018-07-10 DIAGNOSIS — I130 Hypertensive heart and chronic kidney disease with heart failure and stage 1 through stage 4 chronic kidney disease, or unspecified chronic kidney disease: Secondary | ICD-10-CM | POA: Diagnosis not present

## 2018-07-11 ENCOUNTER — Other Ambulatory Visit: Payer: Self-pay | Admitting: *Deleted

## 2018-07-11 NOTE — Patient Outreach (Signed)
[place] [facility]) Care Management  07/11/2018  [name] 1956-01-04 [id number]   Weekly transition of care call placed to member.  She denies any shortness of breath or chest discomfort.  Report weight was 335 pounds yesterday.  She has not been weighing herself daily, only does weights with home health team is present.  Advised to weigh self daily and record weights to monitor trends.  She is unsure what blood sugar is today, has not checked it yet.  Reminded of proper diabetic and low sodium diet.  Denies any urgent concerns, agrees to home visit next week.  Advised to contact this care manager with questions.  THN CM Care Plan Problem One     Most Recent Value  Care Plan Problem One  Risk for readmission related to complications of A-fib & heart failure as evidenced by recent hospitalization  Role Documenting the Problem One  Care Management [place] for Problem One  Active  THN Long Term Goal   Member will not be readmitted to hospital within the next 31 days as evidenced by ED/Admit report  THN Long Term Goal Start Date  07/08/18  Interventions for Problem One Long Term Goal  Reviewed plan of care and risk for complications if not adherent to diet, daily weights, medications  THN CM Short Term Goal #1   Member will keep and attend follow up appointment with cardiologist within the next 2 weeks  THN CM Short Term Goal #1 Start Date  07/05/18  [facility] CM Short Term Goal #1 Met Date  07/11/18  THN CM Short Term Goal #2   Member will weigh self daily and record over the next 4 weeks  THN CM Short Term Goal #2 Start Date  07/05/18  Interventions for Short Term Goal #2  Re-educated on importance of daily weights in effort to closely monitor fluid status  THN CM Short Term Goal #3  Member will report taking medications as indicated over the next 4 weeks  THN CM Short Term Goal #3 Start Date  07/05/18  Interventions for Short Tern Goal #3  Plan made to review  medications in the home during home visit     Valente David, [place], MSN [employer] Manager [phone]

## 2018-07-12 DIAGNOSIS — I482 Chronic atrial fibrillation, unspecified: Secondary | ICD-10-CM | POA: Diagnosis not present

## 2018-07-12 DIAGNOSIS — E785 Hyperlipidemia, unspecified: Secondary | ICD-10-CM | POA: Diagnosis not present

## 2018-07-12 DIAGNOSIS — G4733 Obstructive sleep apnea (adult) (pediatric): Secondary | ICD-10-CM | POA: Diagnosis not present

## 2018-07-12 DIAGNOSIS — I5020 Unspecified systolic (congestive) heart failure: Secondary | ICD-10-CM | POA: Diagnosis not present

## 2018-07-12 DIAGNOSIS — E1140 Type 2 diabetes mellitus with diabetic neuropathy, unspecified: Secondary | ICD-10-CM | POA: Diagnosis not present

## 2018-07-12 DIAGNOSIS — E1122 Type 2 diabetes mellitus with diabetic chronic kidney disease: Secondary | ICD-10-CM | POA: Diagnosis not present

## 2018-07-12 DIAGNOSIS — I10 Essential (primary) hypertension: Secondary | ICD-10-CM | POA: Diagnosis not present

## 2018-07-12 DIAGNOSIS — I38 Endocarditis, valve unspecified: Secondary | ICD-10-CM | POA: Diagnosis not present

## 2018-07-12 DIAGNOSIS — N189 Chronic kidney disease, unspecified: Secondary | ICD-10-CM | POA: Diagnosis not present

## 2018-07-15 DIAGNOSIS — I4891 Unspecified atrial fibrillation: Secondary | ICD-10-CM | POA: Diagnosis not present

## 2018-07-15 DIAGNOSIS — J181 Lobar pneumonia, unspecified organism: Secondary | ICD-10-CM | POA: Diagnosis not present

## 2018-07-15 DIAGNOSIS — E860 Dehydration: Secondary | ICD-10-CM | POA: Diagnosis not present

## 2018-07-15 DIAGNOSIS — I130 Hypertensive heart and chronic kidney disease with heart failure and stage 1 through stage 4 chronic kidney disease, or unspecified chronic kidney disease: Secondary | ICD-10-CM | POA: Diagnosis not present

## 2018-07-15 DIAGNOSIS — J440 Chronic obstructive pulmonary disease with acute lower respiratory infection: Secondary | ICD-10-CM | POA: Diagnosis not present

## 2018-07-15 DIAGNOSIS — N179 Acute kidney failure, unspecified: Secondary | ICD-10-CM | POA: Diagnosis not present

## 2018-07-17 ENCOUNTER — Other Ambulatory Visit: Payer: Self-pay | Admitting: *Deleted

## 2018-07-17 DIAGNOSIS — J181 Lobar pneumonia, unspecified organism: Secondary | ICD-10-CM | POA: Diagnosis not present

## 2018-07-17 DIAGNOSIS — I130 Hypertensive heart and chronic kidney disease with heart failure and stage 1 through stage 4 chronic kidney disease, or unspecified chronic kidney disease: Secondary | ICD-10-CM | POA: Diagnosis not present

## 2018-07-17 DIAGNOSIS — I4891 Unspecified atrial fibrillation: Secondary | ICD-10-CM | POA: Diagnosis not present

## 2018-07-17 DIAGNOSIS — N179 Acute kidney failure, unspecified: Secondary | ICD-10-CM | POA: Diagnosis not present

## 2018-07-17 DIAGNOSIS — E860 Dehydration: Secondary | ICD-10-CM | POA: Diagnosis not present

## 2018-07-17 DIAGNOSIS — J440 Chronic obstructive pulmonary disease with acute lower respiratory infection: Secondary | ICD-10-CM | POA: Diagnosis not present

## 2018-07-17 NOTE — Patient Outreach (Signed)
[facility]) Care Management  07/17/2018  [name] [birth year] [id number]   Home visit scheduled for this afternoon.  Call placed to member prior to going to home to confirm she would be available.  No answer, unable to leave a voice message.  Will not attempt home visit at this time, will follow up within the next 4 business days.  Valente David, [place], MSN [place] [phone]

## 2018-07-23 ENCOUNTER — Other Ambulatory Visit: Payer: Self-pay | Admitting: *Deleted

## 2018-07-23 DIAGNOSIS — J181 Lobar pneumonia, unspecified organism: Secondary | ICD-10-CM | POA: Diagnosis not present

## 2018-07-23 DIAGNOSIS — J440 Chronic obstructive pulmonary disease with acute lower respiratory infection: Secondary | ICD-10-CM | POA: Diagnosis not present

## 2018-07-23 DIAGNOSIS — N179 Acute kidney failure, unspecified: Secondary | ICD-10-CM | POA: Diagnosis not present

## 2018-07-23 DIAGNOSIS — E860 Dehydration: Secondary | ICD-10-CM | POA: Diagnosis not present

## 2018-07-23 DIAGNOSIS — I130 Hypertensive heart and chronic kidney disease with heart failure and stage 1 through stage 4 chronic kidney disease, or unspecified chronic kidney disease: Secondary | ICD-10-CM | POA: Diagnosis not present

## 2018-07-23 DIAGNOSIS — I4891 Unspecified atrial fibrillation: Secondary | ICD-10-CM | POA: Diagnosis not present

## 2018-07-23 NOTE — Patient Outreach (Signed)
[place] [facility]) Care Management  07/23/2018  [name] Aug 15, 1956 [id number]   Weekly transition of care call placed to member, unsuccessful for the 2nd consecutive time.  HIPAA compliant voice message left.  Will send outreach letter and wait for call back.  Will follow up within the next 4 business days.    Valente David, [place], MSN [place] [phone]

## 2018-07-25 DIAGNOSIS — E860 Dehydration: Secondary | ICD-10-CM | POA: Diagnosis not present

## 2018-07-25 DIAGNOSIS — J181 Lobar pneumonia, unspecified organism: Secondary | ICD-10-CM | POA: Diagnosis not present

## 2018-07-25 DIAGNOSIS — I4891 Unspecified atrial fibrillation: Secondary | ICD-10-CM | POA: Diagnosis not present

## 2018-07-25 DIAGNOSIS — J440 Chronic obstructive pulmonary disease with acute lower respiratory infection: Secondary | ICD-10-CM | POA: Diagnosis not present

## 2018-07-25 DIAGNOSIS — N179 Acute kidney failure, unspecified: Secondary | ICD-10-CM | POA: Diagnosis not present

## 2018-07-25 DIAGNOSIS — I130 Hypertensive heart and chronic kidney disease with heart failure and stage 1 through stage 4 chronic kidney disease, or unspecified chronic kidney disease: Secondary | ICD-10-CM | POA: Diagnosis not present

## 2018-07-26 ENCOUNTER — Other Ambulatory Visit: Payer: Self-pay | Admitting: *Deleted

## 2018-07-26 NOTE — Patient Outreach (Signed)
[facility]) Care Management  07/26/2018  Etosha Wetherell [birth year] [id number]   3rd consecutive unsuccessful outreach attempt.  HIPAA compliant voice message left.  Will await call back, if no call back by 12/11 will close case.  Valente David, [place], MSN [place] [phone]

## 2018-08-01 ENCOUNTER — Other Ambulatory Visit: Payer: Self-pay | Admitting: *Deleted

## 2018-08-01 NOTE — Patient Outreach (Signed)
[place] [facility]) Care Management  08/01/2018  Paige Monarrez May 19, 1956 [id number]   No response from member after 3 unsuccessful attempts and outreach letter.  Will close case at this time.  Will notify member and primary MD of case closure.  Valente David, [place], MSN [place] [phone]

## 2018-09-02 ENCOUNTER — Encounter (HOSPITAL_COMMUNITY): Payer: Self-pay | Admitting: *Deleted

## 2018-09-02 ENCOUNTER — Emergency Department (HOSPITAL_COMMUNITY)
Admission: EM | Admit: 2018-09-02 | Discharge: 2018-09-02 | Disposition: A | Discharge status: Home / Self Care | Payer: Medicare HMO | Attending: Emergency Medicine | Admitting: Emergency Medicine

## 2018-09-02 ENCOUNTER — Emergency Department (HOSPITAL_COMMUNITY): Payer: Medicare HMO

## 2018-09-02 DIAGNOSIS — E785 Hyperlipidemia, unspecified: Secondary | ICD-10-CM | POA: Insufficient documentation

## 2018-09-02 DIAGNOSIS — R05 Cough: Secondary | ICD-10-CM | POA: Diagnosis not present

## 2018-09-02 DIAGNOSIS — R0600 Dyspnea, unspecified: Secondary | ICD-10-CM | POA: Diagnosis not present

## 2018-09-02 DIAGNOSIS — I5042 Chronic combined systolic (congestive) and diastolic (congestive) heart failure: Secondary | ICD-10-CM | POA: Insufficient documentation

## 2018-09-02 DIAGNOSIS — I110 Hypertensive heart disease with heart failure: Secondary | ICD-10-CM | POA: Insufficient documentation

## 2018-09-02 DIAGNOSIS — M545 Low back pain: Secondary | ICD-10-CM | POA: Diagnosis not present

## 2018-09-02 DIAGNOSIS — M7989 Other specified soft tissue disorders: Secondary | ICD-10-CM | POA: Diagnosis not present

## 2018-09-02 DIAGNOSIS — Z79899 Other long term (current) drug therapy: Secondary | ICD-10-CM | POA: Diagnosis not present

## 2018-09-02 DIAGNOSIS — Z86718 Personal history of other venous thrombosis and embolism: Secondary | ICD-10-CM | POA: Insufficient documentation

## 2018-09-02 DIAGNOSIS — R0789 Other chest pain: Secondary | ICD-10-CM | POA: Diagnosis not present

## 2018-09-02 DIAGNOSIS — M5489 Other dorsalgia: Secondary | ICD-10-CM | POA: Diagnosis not present

## 2018-09-02 DIAGNOSIS — I4891 Unspecified atrial fibrillation: Secondary | ICD-10-CM | POA: Diagnosis not present

## 2018-09-02 DIAGNOSIS — Z794 Long term (current) use of insulin: Secondary | ICD-10-CM | POA: Diagnosis not present

## 2018-09-02 DIAGNOSIS — J449 Chronic obstructive pulmonary disease, unspecified: Secondary | ICD-10-CM | POA: Diagnosis not present

## 2018-09-02 DIAGNOSIS — E119 Type 2 diabetes mellitus without complications: Secondary | ICD-10-CM | POA: Insufficient documentation

## 2018-09-02 DIAGNOSIS — R079 Chest pain, unspecified: Secondary | ICD-10-CM | POA: Diagnosis not present

## 2018-09-02 LAB — CBC
HCT: 38.1 % (ref 36.0–46.0)
Hemoglobin: 11.9 g/dL — ABNORMAL LOW (ref 12.0–15.0)
MCH: 28.5 pg (ref 26.0–34.0)
MCHC: 31.2 g/dL (ref 30.0–36.0)
MCV: 91.1 fL (ref 80.0–100.0)
Platelets: 190 K/uL (ref 150–400)
RBC: 4.18 MIL/uL (ref 3.87–5.11)
RDW: 16.8 % — ABNORMAL HIGH (ref 11.5–15.5)
WBC: 5.4 K/uL (ref 4.0–10.5)
nRBC: 0 % (ref 0.0–0.2)

## 2018-09-02 LAB — BASIC METABOLIC PANEL WITH GFR
Anion gap: 9 (ref 5–15)
BUN: 14 mg/dL (ref 8–23)
CO2: 26 mmol/L (ref 22–32)
Calcium: 9.5 mg/dL (ref 8.9–10.3)
Chloride: 103 mmol/L (ref 98–111)
Creatinine, Ser: 1.57 mg/dL — ABNORMAL HIGH (ref 0.44–1.00)
GFR calc Af Amer: 41 mL/min — ABNORMAL LOW
GFR calc non Af Amer: 35 mL/min — ABNORMAL LOW
Glucose, Bld: 93 mg/dL (ref 70–99)
Potassium: 3.5 mmol/L (ref 3.5–5.1)
Sodium: 138 mmol/L (ref 135–145)

## 2018-09-02 LAB — I-STAT TROPONIN, ED: Troponin i, poc: 0 ng/mL (ref 0.00–0.08)

## 2018-09-02 LAB — BRAIN NATRIURETIC PEPTIDE: B Natriuretic Peptide: 204.8 pg/mL — ABNORMAL HIGH (ref 0.0–100.0)

## 2018-09-02 NOTE — ED Notes (Signed)
Patient verbalizes understanding of discharge instructions. Opportunity for questioning and answers were provided. Armband removed by staff, pt discharged from ED.  

## 2018-09-02 NOTE — ED Provider Notes (Signed)
[facility] EMERGENCY DEPARTMENT Provider Note   CSN: [id number] Arrival date & time: 09/02/18  1333     History   Chief Complaint Chief Complaint  Patient presents with  . Chest Pain    HPI [name] is a 63 y.o. female.  HPI   Patient is 63 year old female with a history of asthma, CHF, COPD, diabetes, GERD, hypertension, hyperlipidemia, sleep apnea, and afib (dabigatran) who presents the emergency department today complaining of chest pain.  She complains of substernal chest pain that is in the middle and on the left side of the chest. Pain began yesterday.  She states is waxing and waning.  Does not radiate.  Pain feels like a stabbing pain. It is worse when she moves.   Reports orthopnea and dyspnea on exertion for the last several months, worsened yesterday as well. She has had increased swelling to the BLE. Also c/o dry cough. Has chronic nasal congestion, rhinorrhea. No fevers.  States today she had some irritation to her leg so she scratched her leg and the leg started having fluid come out of it.  Fluid is clear.  States she currently takes 80mg  BID.   Patient arrives via EMS.  She was given 325 aspirin prior to arrival.  Past Medical History:  Diagnosis Date  . Allergic rhinitis   . Arthritis   . Asthma   . Chest pain 12/27/2017  . Chronic diastolic CHF (congestive heart failure) ([place])   . COPD (chronic obstructive pulmonary disease) ([place])   . Depression   . DM (diabetes mellitus) ([place])   . DVT (deep vein thrombosis) in pregnancy   . Dysrhythmia    atrial fibrilation  . GERD (gastroesophageal reflux disease)   . Headache(784.0)   . HTN (hypertension)   . Hyperlipidemia   . Obesity   . Pneumonia 04/2018   RIGHT LOBE  . Shortness of breath   . Sleep apnea    compliant with CPAP    Patient Active Problem List   Diagnosis Date Noted  . Acute on chronic left systolic heart failure ([place]) 07/01/2018  . Congestive heart  failure with left ventricular diastolic dysfunction, acute ([place]) 07/01/2018  . Right upper lobe pneumonia ([place]) 05/14/2018  . Atrial fibrillation with RVR ([place]) 05/14/2018  . Chronic atrial fibrillation 12/26/2017  . Diastolic dysfunction 18/29/9371  . Diabetes mellitus type 2 in obese ([place]) 12/26/2017  . Nausea vomiting and diarrhea   . Chest pain 02/26/2012  . OSA (obstructive sleep apnea) 09/21/2011  . Hypoxia 09/21/2011  . Diabetes mellitus ([place]) 02/04/2010  . Morbid obesity ([place]) 02/04/2010  . Atrial fibrillation ([place]) 02/04/2010  . GASTROPARESIS 02/04/2010  . GASTROENTERITIS 02/04/2010    Past Surgical History:  Procedure Laterality Date  . ABDOMINAL HYSTERECTOMY    . CATARACT EXTRACTION, BILATERAL    . CHOLECYSTECTOMY    . COLONOSCOPY WITH PROPOFOL N/A 03/05/2015   Procedure: COLONOSCOPY WITH PROPOFOL;  Surgeon: Carol Ada, MD;  Location: [facility];  Service: Endoscopy;  Laterality: N/A;  . DIAGNOSTIC LAPAROSCOPY    . ingrown hallux Left   . KNEE SURGERY    . LEFT HEART CATH AND CORONARY ANGIOGRAPHY N/A 12/31/2017   Procedure: LEFT HEART CATH AND CORONARY ANGIOGRAPHY;  Surgeon: Charolette Forward, MD;  Location: [facility];  Service: Cardiovascular;  Laterality: N/A;     OB History   No obstetric history on file.      Home Medications    Prior to Admission medications   Medication  Sig Start Date End Date Taking? Authorizing Provider  acetaminophen (TYLENOL) 325 MG tablet Take 2 tablets (650 mg total) by mouth every 6 (six) hours as needed for mild pain, moderate pain or headache. 05/20/18   Charolette Forward, MD  dabigatran (PRADAXA) 150 MG CAPS capsule Take 1 capsule (150 mg total) by mouth every 12 (twelve) hours. 04/12/16   Florencia Reasons, MD  digoxin (LANOXIN) 0.125 MG tablet Take 1 tablet (125 mcg total) by mouth daily. 07/04/18 07/04/19  Charolette Forward, MD  DULoxetine (CYMBALTA) 60 MG capsule Take 1 capsule (60 mg total) by mouth daily. 05/20/18   Charolette Forward,  MD  furosemide (LASIX) 40 MG tablet Take 1 tablet (40 mg total) by mouth 2 (two) times daily. 07/04/18 07/04/19  Charolette Forward, MD  gabapentin (NEURONTIN) 300 MG capsule Take 300 mg by mouth 2 (two) times daily.  09/13/11   [provider]  insulin aspart (NOVOLOG) 100 UNIT/ML injection Inject 0-10 Units into the skin. PER SLIDING SCALE    [provider]  insulin detemir (LEVEMIR) 100 UNIT/ML injection Inject 0.15 mLs (15 Units total) into the skin 2 (two) times daily. 07/04/18   Charolette Forward, MD  losartan (COZAAR) 25 MG tablet Take 1 tablet (25 mg total) by mouth daily. 07/04/18   Charolette Forward, MD  metoprolol tartrate (LOPRESSOR) 50 MG tablet Take 1 tablet (50 mg total) by mouth 3 (three) times daily. 07/04/18   Charolette Forward, MD  Multiple Vitamins-Minerals (MULTIVITAMIN WOMEN PO) Take 1 tablet by mouth daily.    [provider]  nitroGLYCERIN (NITROSTAT) 0.4 MG SL tablet Place 1 tablet (0.4 mg total) under the tongue every 5 (five) minutes x 3 doses as needed for chest pain. 01/08/14   Charolette Forward, MD  pantoprazole (PROTONIX) 40 MG tablet Take 1 tablet (40 mg total) by mouth 2 (two) times daily. 01/01/18   Regalado, [place] A, MD  potassium chloride SA (K-DUR,KLOR-CON) 20 MEQ tablet Take 1 tablet (20 mEq total) by mouth daily. 07/04/18   Charolette Forward, MD  PROAIR HFA 108 (90 BASE) MCG/ACT inhaler Inhale 2 puffs into the lungs every 4 (four) hours as needed for wheezing (wheezing).  05/28/12   [provider]  RESTASIS 0.05 % ophthalmic emulsion Place 1 drop into both eyes 2 (two) times daily. 11/19/17   [provider]    Family History Family History  Problem Relation Age of Onset  . Cancer Mother   . Hypertension Mother   . Cancer Father   . CAD Other   . Hypertension Sister   . Hypertension Brother     Social History Social History   Tobacco Use  . Smoking status: Never Smoker  . Smokeless tobacco: Never Used  Substance Use Topics    . Alcohol use: No  . Drug use: No     Allergies   Sulfa antibiotics   Review of Systems Review of Systems  Constitutional: Negative for chills and fever.  HENT: Positive for congestion and rhinorrhea. Negative for ear pain.   Eyes: Negative for pain and visual disturbance.  Respiratory: Positive for cough and shortness of breath.   Cardiovascular: Positive for chest pain and leg swelling. Negative for palpitations.  Gastrointestinal: Negative for abdominal pain, constipation, diarrhea, nausea and vomiting.  Genitourinary: Negative for dysuria, flank pain and hematuria.  Musculoskeletal: Negative for back pain.  Skin: Negative for rash.  Neurological: Negative for syncope and light-headedness.  All other systems reviewed and are negative.    Physical Exam  Updated Vital Signs BP 117/69   Pulse 79   Temp 97.9 F (36.6 C) (Oral)   Resp (!) 21   SpO2 98%   Physical Exam Vitals signs and nursing note reviewed.  Constitutional:      General: She is not in acute distress.    Appearance: She is well-developed. She is obese.  HENT:     Head: Normocephalic and atraumatic.  Eyes:     Conjunctiva/sclera: Conjunctivae normal.  Neck:     Musculoskeletal: Neck supple.  Cardiovascular:     Rate and Rhythm: Normal rate and regular rhythm.     Heart sounds: No murmur.  Pulmonary:     Effort: Pulmonary effort is normal. No respiratory distress.     Breath sounds: Decreased breath sounds present.  Abdominal:     Palpations: Abdomen is soft.     Tenderness: There is no abdominal tenderness.  Musculoskeletal:     Comments: Trace edema to BLE. RLE is weeping serous fluid.  Skin:    General: Skin is warm and dry.  Neurological:     Mental Status: She is alert.      ED Treatments / Results  Labs (all labs ordered are listed, but only abnormal results are displayed) Labs Reviewed  BASIC METABOLIC PANEL - Abnormal; Notable for the following components:      Result Value    Creatinine, Ser 1.57 (*)    GFR calc non Af Amer 35 (*)    GFR calc Af Amer 41 (*)    All other components within normal limits  CBC - Abnormal; Notable for the following components:   Hemoglobin 11.9 (*)    RDW 16.8 (*)    All other components within normal limits  BRAIN NATRIURETIC PEPTIDE - Abnormal; Notable for the following components:   B Natriuretic Peptide 204.8 (*)    All other components within normal limits  I-STAT TROPONIN, ED    EKG EKG Interpretation  Date/Time:  Monday September 02 2018 13:39:33 EST Ventricular Rate:  75 PR Interval:    QRS Duration: 121 QT Interval:  388 QTC Calculation: 434 R Axis:   141 Text Interpretation:  Atrial fibrillation Ventricular premature complex RBBB and LPFB Confirmed by Dene Gentry [phone]) on 09/02/2018 1:51:15 PM   Radiology Dg Chest 2 View  Result Date: 09/02/2018 CLINICAL DATA:  Chest pain since yesterday, swelling in legs, history asthma, hypertension, diabetes mellitus, smoker, CHF EXAM: CHEST - 2 VIEW COMPARISON:  06/30/2018 FINDINGS: Enlargement of cardiac silhouette. Mediastinal contours and pulmonary vascularity normal. Lungs clear. No infiltrate, pleural effusion or pneumothorax. Bones unremarkable. IMPRESSION: Enlargement of cardiac silhouette without acute infiltrate. Electronically Signed   By: Lavonia Dana M.D.   On: 09/02/2018 15:20    Procedures Procedures (including critical care time)  Medications Ordered in ED Medications - No data to display   Initial Impression / Assessment and Plan / ED Course  I have reviewed the triage vital signs and the nursing notes.  Pertinent labs & imaging results that were available during my care of the patient were reviewed by me and considered in my medical decision making (see chart for details).     Final Clinical Impressions(s) / ED Diagnoses   Final diagnoses:  Atypical chest pain   Patient presenting the ED today complaining of mid to left-sided chest pain that  began yesterday.  Pain waxes and wanes and is worse with movement. Chest pain is reproducible on exam.  Found to be satting at 97% on  room air with normal respirations.  Vitals otherwise normal.  Lungs are CTA. Trace edema to lower extremities, but not floridly fluid overloaded.  CBC without leukocytosis.  Anemia stable. BMP with elevated creatinine 1.57, improved from 2 months ago.  No electrolyte derangement. Troponin negative. BNP is elevated at 204.  EKG with atrial fibrillation, Ventricular premature complex, RBBB and LPFB  CXR shows cardiomegaly, no evidence pulmonary edema.   Pt has had continuous chest pain since yesterday with negative trop today. EKG unchanged from prior.  Pt had recent cath in May 2019 that did not show significant occlusion. Her presentation seems atypical for ACS. Sxs do not sound consistent with PE. However, she was ambulated in the ED and was able to maintain her sats to 94% on RA.   Pt seen in conjunction with Dr. Francia Greaves who evaluated pt and is in agreement with plan.   3:57 PM CONSULT with Dr. Terrence Dupont who states that pt likely safe for discharge. He will see her in the office as early as tomorrow.   ED Discharge Orders    None       [place] 09/02/18 1559    Valarie Merino, MD 09/03/18 [phone]

## 2018-09-02 NOTE — ED Triage Notes (Signed)
Non radiating mid sternal chest pain which began yesterday.  This has been intermittent and associated with lower abdominal pain.  Pt would also like to have her right leg evaluated for a bite which has been oozing (unable to tell about swelling due to pt anatomy)

## 2018-09-02 NOTE — ED Notes (Signed)
Pt's pulse ox dropped from 98% on RA to 94% RA while ambulating. She complained of increased SOB, chest pain, and back pain. HR increased to 102 bpm.

## 2018-09-02 NOTE — ED Triage Notes (Signed)
Pt does not have a IV from ems but received 324mg  asa pta

## 2018-09-02 NOTE — Discharge Instructions (Addendum)
Please call Dr. Zenia Resides office today to make an appointment for later this week.  Please return the emergency department for any new or worsening symptoms in the meantime.

## 2018-09-09 DIAGNOSIS — G4733 Obstructive sleep apnea (adult) (pediatric): Secondary | ICD-10-CM | POA: Diagnosis not present

## 2018-09-12 ENCOUNTER — Encounter (HOSPITAL_BASED_OUTPATIENT_CLINIC_OR_DEPARTMENT_OTHER): Payer: Medicare HMO | Attending: Internal Medicine

## 2018-09-12 DIAGNOSIS — E119 Type 2 diabetes mellitus without complications: Secondary | ICD-10-CM | POA: Insufficient documentation

## 2018-09-12 DIAGNOSIS — Z794 Long term (current) use of insulin: Secondary | ICD-10-CM | POA: Insufficient documentation

## 2018-09-12 DIAGNOSIS — Z9989 Dependence on other enabling machines and devices: Secondary | ICD-10-CM | POA: Insufficient documentation

## 2018-09-12 DIAGNOSIS — G4730 Sleep apnea, unspecified: Secondary | ICD-10-CM | POA: Insufficient documentation

## 2018-09-12 DIAGNOSIS — I890 Lymphedema, not elsewhere classified: Secondary | ICD-10-CM | POA: Insufficient documentation

## 2018-09-12 DIAGNOSIS — I110 Hypertensive heart disease with heart failure: Secondary | ICD-10-CM | POA: Insufficient documentation

## 2018-09-12 DIAGNOSIS — I509 Heart failure, unspecified: Secondary | ICD-10-CM | POA: Insufficient documentation

## 2018-09-12 DIAGNOSIS — J449 Chronic obstructive pulmonary disease, unspecified: Secondary | ICD-10-CM | POA: Insufficient documentation

## 2018-09-16 DIAGNOSIS — Z794 Long term (current) use of insulin: Secondary | ICD-10-CM | POA: Diagnosis not present

## 2018-09-16 DIAGNOSIS — E119 Type 2 diabetes mellitus without complications: Secondary | ICD-10-CM | POA: Diagnosis not present

## 2018-09-16 DIAGNOSIS — I890 Lymphedema, not elsewhere classified: Secondary | ICD-10-CM | POA: Diagnosis not present

## 2018-09-16 DIAGNOSIS — I110 Hypertensive heart disease with heart failure: Secondary | ICD-10-CM | POA: Diagnosis not present

## 2018-09-16 DIAGNOSIS — S81801A Unspecified open wound, right lower leg, initial encounter: Secondary | ICD-10-CM | POA: Diagnosis not present

## 2018-09-16 DIAGNOSIS — I509 Heart failure, unspecified: Secondary | ICD-10-CM | POA: Diagnosis not present

## 2018-09-16 DIAGNOSIS — G4730 Sleep apnea, unspecified: Secondary | ICD-10-CM | POA: Diagnosis not present

## 2018-09-16 DIAGNOSIS — J449 Chronic obstructive pulmonary disease, unspecified: Secondary | ICD-10-CM | POA: Diagnosis not present

## 2018-09-16 DIAGNOSIS — Z9989 Dependence on other enabling machines and devices: Secondary | ICD-10-CM | POA: Diagnosis not present

## 2018-09-20 DIAGNOSIS — E1122 Type 2 diabetes mellitus with diabetic chronic kidney disease: Secondary | ICD-10-CM | POA: Diagnosis not present

## 2018-09-20 DIAGNOSIS — E785 Hyperlipidemia, unspecified: Secondary | ICD-10-CM | POA: Diagnosis not present

## 2018-09-20 DIAGNOSIS — N189 Chronic kidney disease, unspecified: Secondary | ICD-10-CM | POA: Diagnosis not present

## 2018-09-20 DIAGNOSIS — E1140 Type 2 diabetes mellitus with diabetic neuropathy, unspecified: Secondary | ICD-10-CM | POA: Diagnosis not present

## 2018-09-20 DIAGNOSIS — I38 Endocarditis, valve unspecified: Secondary | ICD-10-CM | POA: Diagnosis not present

## 2018-09-20 DIAGNOSIS — K219 Gastro-esophageal reflux disease without esophagitis: Secondary | ICD-10-CM | POA: Diagnosis not present

## 2018-09-20 DIAGNOSIS — I10 Essential (primary) hypertension: Secondary | ICD-10-CM | POA: Diagnosis not present

## 2018-09-20 DIAGNOSIS — I482 Chronic atrial fibrillation, unspecified: Secondary | ICD-10-CM | POA: Diagnosis not present

## 2018-09-20 DIAGNOSIS — I5020 Unspecified systolic (congestive) heart failure: Secondary | ICD-10-CM | POA: Diagnosis not present

## 2018-09-26 ENCOUNTER — Ambulatory Visit: Payer: Medicare HMO | Admitting: Podiatry

## 2018-10-10 ENCOUNTER — Encounter (HOSPITAL_BASED_OUTPATIENT_CLINIC_OR_DEPARTMENT_OTHER): Payer: Medicare HMO | Attending: Internal Medicine

## 2018-10-10 DIAGNOSIS — I509 Heart failure, unspecified: Secondary | ICD-10-CM | POA: Insufficient documentation

## 2018-10-10 DIAGNOSIS — E11622 Type 2 diabetes mellitus with other skin ulcer: Secondary | ICD-10-CM | POA: Diagnosis not present

## 2018-10-10 DIAGNOSIS — L97822 Non-pressure chronic ulcer of other part of left lower leg with fat layer exposed: Secondary | ICD-10-CM | POA: Diagnosis not present

## 2018-10-10 DIAGNOSIS — I890 Lymphedema, not elsewhere classified: Secondary | ICD-10-CM | POA: Insufficient documentation

## 2018-10-10 DIAGNOSIS — G4730 Sleep apnea, unspecified: Secondary | ICD-10-CM | POA: Insufficient documentation

## 2018-10-10 DIAGNOSIS — I110 Hypertensive heart disease with heart failure: Secondary | ICD-10-CM | POA: Insufficient documentation

## 2018-10-17 DIAGNOSIS — I110 Hypertensive heart disease with heart failure: Secondary | ICD-10-CM | POA: Diagnosis not present

## 2018-10-17 DIAGNOSIS — I509 Heart failure, unspecified: Secondary | ICD-10-CM | POA: Diagnosis not present

## 2018-10-17 DIAGNOSIS — G4730 Sleep apnea, unspecified: Secondary | ICD-10-CM | POA: Diagnosis not present

## 2018-10-17 DIAGNOSIS — I890 Lymphedema, not elsewhere classified: Secondary | ICD-10-CM | POA: Diagnosis not present

## 2018-10-17 DIAGNOSIS — E11622 Type 2 diabetes mellitus with other skin ulcer: Secondary | ICD-10-CM | POA: Diagnosis not present

## 2018-10-17 DIAGNOSIS — L97822 Non-pressure chronic ulcer of other part of left lower leg with fat layer exposed: Secondary | ICD-10-CM | POA: Diagnosis not present

## 2018-10-21 DIAGNOSIS — E119 Type 2 diabetes mellitus without complications: Secondary | ICD-10-CM | POA: Diagnosis not present

## 2018-10-24 ENCOUNTER — Encounter (HOSPITAL_BASED_OUTPATIENT_CLINIC_OR_DEPARTMENT_OTHER): Payer: Medicare HMO | Attending: Internal Medicine

## 2018-10-24 ENCOUNTER — Ambulatory Visit: Payer: Medicare HMO | Admitting: Podiatry

## 2018-10-24 DIAGNOSIS — I509 Heart failure, unspecified: Secondary | ICD-10-CM | POA: Diagnosis not present

## 2018-10-24 DIAGNOSIS — L97822 Non-pressure chronic ulcer of other part of left lower leg with fat layer exposed: Secondary | ICD-10-CM | POA: Insufficient documentation

## 2018-10-24 DIAGNOSIS — I890 Lymphedema, not elsewhere classified: Secondary | ICD-10-CM | POA: Diagnosis not present

## 2018-10-24 DIAGNOSIS — G4730 Sleep apnea, unspecified: Secondary | ICD-10-CM | POA: Insufficient documentation

## 2018-10-24 DIAGNOSIS — E11622 Type 2 diabetes mellitus with other skin ulcer: Secondary | ICD-10-CM | POA: Insufficient documentation

## 2018-10-24 DIAGNOSIS — I110 Hypertensive heart disease with heart failure: Secondary | ICD-10-CM | POA: Diagnosis not present

## 2018-10-25 ENCOUNTER — Encounter (HOSPITAL_BASED_OUTPATIENT_CLINIC_OR_DEPARTMENT_OTHER): Payer: Medicare HMO

## 2018-10-29 ENCOUNTER — Ambulatory Visit: Payer: Medicare HMO | Admitting: Podiatry

## 2018-11-25 ENCOUNTER — Encounter (HOSPITAL_BASED_OUTPATIENT_CLINIC_OR_DEPARTMENT_OTHER): Payer: Medicare HMO | Attending: Internal Medicine

## 2018-12-09 DIAGNOSIS — G4733 Obstructive sleep apnea (adult) (pediatric): Secondary | ICD-10-CM | POA: Diagnosis not present

## 2018-12-24 ENCOUNTER — Encounter (HOSPITAL_BASED_OUTPATIENT_CLINIC_OR_DEPARTMENT_OTHER): Payer: Medicare HMO | Attending: Internal Medicine

## 2018-12-24 DIAGNOSIS — I890 Lymphedema, not elsewhere classified: Secondary | ICD-10-CM | POA: Insufficient documentation

## 2018-12-24 DIAGNOSIS — G4730 Sleep apnea, unspecified: Secondary | ICD-10-CM | POA: Insufficient documentation

## 2018-12-24 DIAGNOSIS — I110 Hypertensive heart disease with heart failure: Secondary | ICD-10-CM | POA: Insufficient documentation

## 2018-12-24 DIAGNOSIS — I509 Heart failure, unspecified: Secondary | ICD-10-CM | POA: Insufficient documentation

## 2018-12-24 DIAGNOSIS — L97821 Non-pressure chronic ulcer of other part of left lower leg limited to breakdown of skin: Secondary | ICD-10-CM | POA: Insufficient documentation

## 2018-12-24 DIAGNOSIS — Z794 Long term (current) use of insulin: Secondary | ICD-10-CM | POA: Insufficient documentation

## 2018-12-26 DIAGNOSIS — I110 Hypertensive heart disease with heart failure: Secondary | ICD-10-CM | POA: Diagnosis not present

## 2018-12-26 DIAGNOSIS — E1122 Type 2 diabetes mellitus with diabetic chronic kidney disease: Secondary | ICD-10-CM | POA: Diagnosis not present

## 2018-12-26 DIAGNOSIS — I38 Endocarditis, valve unspecified: Secondary | ICD-10-CM | POA: Diagnosis not present

## 2018-12-26 DIAGNOSIS — S80822A Blister (nonthermal), left lower leg, initial encounter: Secondary | ICD-10-CM | POA: Diagnosis not present

## 2018-12-26 DIAGNOSIS — I890 Lymphedema, not elsewhere classified: Secondary | ICD-10-CM | POA: Diagnosis not present

## 2018-12-26 DIAGNOSIS — Z794 Long term (current) use of insulin: Secondary | ICD-10-CM | POA: Diagnosis not present

## 2018-12-26 DIAGNOSIS — N189 Chronic kidney disease, unspecified: Secondary | ICD-10-CM | POA: Diagnosis not present

## 2018-12-26 DIAGNOSIS — G4730 Sleep apnea, unspecified: Secondary | ICD-10-CM | POA: Diagnosis not present

## 2018-12-26 DIAGNOSIS — E785 Hyperlipidemia, unspecified: Secondary | ICD-10-CM | POA: Diagnosis not present

## 2018-12-26 DIAGNOSIS — I509 Heart failure, unspecified: Secondary | ICD-10-CM | POA: Diagnosis not present

## 2018-12-26 DIAGNOSIS — I5020 Unspecified systolic (congestive) heart failure: Secondary | ICD-10-CM | POA: Diagnosis not present

## 2018-12-26 DIAGNOSIS — I10 Essential (primary) hypertension: Secondary | ICD-10-CM | POA: Diagnosis not present

## 2018-12-26 DIAGNOSIS — G4733 Obstructive sleep apnea (adult) (pediatric): Secondary | ICD-10-CM | POA: Diagnosis not present

## 2018-12-26 DIAGNOSIS — L97821 Non-pressure chronic ulcer of other part of left lower leg limited to breakdown of skin: Secondary | ICD-10-CM | POA: Diagnosis not present

## 2018-12-26 DIAGNOSIS — I482 Chronic atrial fibrillation, unspecified: Secondary | ICD-10-CM | POA: Diagnosis not present

## 2019-01-02 DIAGNOSIS — I509 Heart failure, unspecified: Secondary | ICD-10-CM | POA: Diagnosis not present

## 2019-01-02 DIAGNOSIS — S81802A Unspecified open wound, left lower leg, initial encounter: Secondary | ICD-10-CM | POA: Diagnosis not present

## 2019-01-02 DIAGNOSIS — L97821 Non-pressure chronic ulcer of other part of left lower leg limited to breakdown of skin: Secondary | ICD-10-CM | POA: Diagnosis not present

## 2019-01-02 DIAGNOSIS — G4730 Sleep apnea, unspecified: Secondary | ICD-10-CM | POA: Diagnosis not present

## 2019-01-02 DIAGNOSIS — Z794 Long term (current) use of insulin: Secondary | ICD-10-CM | POA: Diagnosis not present

## 2019-01-02 DIAGNOSIS — I890 Lymphedema, not elsewhere classified: Secondary | ICD-10-CM | POA: Diagnosis not present

## 2019-01-02 DIAGNOSIS — I110 Hypertensive heart disease with heart failure: Secondary | ICD-10-CM | POA: Diagnosis not present

## 2019-01-05 DIAGNOSIS — J449 Chronic obstructive pulmonary disease, unspecified: Secondary | ICD-10-CM | POA: Diagnosis not present

## 2019-01-05 DIAGNOSIS — I4891 Unspecified atrial fibrillation: Secondary | ICD-10-CM | POA: Diagnosis not present

## 2019-01-05 DIAGNOSIS — E119 Type 2 diabetes mellitus without complications: Secondary | ICD-10-CM | POA: Diagnosis not present

## 2019-01-05 DIAGNOSIS — G894 Chronic pain syndrome: Secondary | ICD-10-CM | POA: Diagnosis not present

## 2019-01-05 DIAGNOSIS — I110 Hypertensive heart disease with heart failure: Secondary | ICD-10-CM | POA: Diagnosis not present

## 2019-01-05 DIAGNOSIS — I890 Lymphedema, not elsewhere classified: Secondary | ICD-10-CM | POA: Diagnosis not present

## 2019-01-05 DIAGNOSIS — F329 Major depressive disorder, single episode, unspecified: Secondary | ICD-10-CM | POA: Diagnosis not present

## 2019-01-05 DIAGNOSIS — L97822 Non-pressure chronic ulcer of other part of left lower leg with fat layer exposed: Secondary | ICD-10-CM | POA: Diagnosis not present

## 2019-01-05 DIAGNOSIS — I509 Heart failure, unspecified: Secondary | ICD-10-CM | POA: Diagnosis not present

## 2019-01-07 DIAGNOSIS — I890 Lymphedema, not elsewhere classified: Secondary | ICD-10-CM | POA: Diagnosis not present

## 2019-01-07 DIAGNOSIS — I110 Hypertensive heart disease with heart failure: Secondary | ICD-10-CM | POA: Diagnosis not present

## 2019-01-07 DIAGNOSIS — I4891 Unspecified atrial fibrillation: Secondary | ICD-10-CM | POA: Diagnosis not present

## 2019-01-07 DIAGNOSIS — L97822 Non-pressure chronic ulcer of other part of left lower leg with fat layer exposed: Secondary | ICD-10-CM | POA: Diagnosis not present

## 2019-01-07 DIAGNOSIS — E119 Type 2 diabetes mellitus without complications: Secondary | ICD-10-CM | POA: Diagnosis not present

## 2019-01-07 DIAGNOSIS — J449 Chronic obstructive pulmonary disease, unspecified: Secondary | ICD-10-CM | POA: Diagnosis not present

## 2019-01-07 DIAGNOSIS — G894 Chronic pain syndrome: Secondary | ICD-10-CM | POA: Diagnosis not present

## 2019-01-07 DIAGNOSIS — I509 Heart failure, unspecified: Secondary | ICD-10-CM | POA: Diagnosis not present

## 2019-01-07 DIAGNOSIS — F329 Major depressive disorder, single episode, unspecified: Secondary | ICD-10-CM | POA: Diagnosis not present

## 2019-01-13 DIAGNOSIS — J449 Chronic obstructive pulmonary disease, unspecified: Secondary | ICD-10-CM | POA: Diagnosis not present

## 2019-01-13 DIAGNOSIS — E119 Type 2 diabetes mellitus without complications: Secondary | ICD-10-CM | POA: Diagnosis not present

## 2019-01-13 DIAGNOSIS — I110 Hypertensive heart disease with heart failure: Secondary | ICD-10-CM | POA: Diagnosis not present

## 2019-01-13 DIAGNOSIS — I890 Lymphedema, not elsewhere classified: Secondary | ICD-10-CM | POA: Diagnosis not present

## 2019-01-13 DIAGNOSIS — G894 Chronic pain syndrome: Secondary | ICD-10-CM | POA: Diagnosis not present

## 2019-01-13 DIAGNOSIS — L97822 Non-pressure chronic ulcer of other part of left lower leg with fat layer exposed: Secondary | ICD-10-CM | POA: Diagnosis not present

## 2019-01-13 DIAGNOSIS — I4891 Unspecified atrial fibrillation: Secondary | ICD-10-CM | POA: Diagnosis not present

## 2019-01-13 DIAGNOSIS — F329 Major depressive disorder, single episode, unspecified: Secondary | ICD-10-CM | POA: Diagnosis not present

## 2019-01-13 DIAGNOSIS — I509 Heart failure, unspecified: Secondary | ICD-10-CM | POA: Diagnosis not present

## 2019-01-20 DIAGNOSIS — J449 Chronic obstructive pulmonary disease, unspecified: Secondary | ICD-10-CM | POA: Diagnosis not present

## 2019-01-20 DIAGNOSIS — L97822 Non-pressure chronic ulcer of other part of left lower leg with fat layer exposed: Secondary | ICD-10-CM | POA: Diagnosis not present

## 2019-01-20 DIAGNOSIS — G894 Chronic pain syndrome: Secondary | ICD-10-CM | POA: Diagnosis not present

## 2019-01-20 DIAGNOSIS — I4891 Unspecified atrial fibrillation: Secondary | ICD-10-CM | POA: Diagnosis not present

## 2019-01-20 DIAGNOSIS — I509 Heart failure, unspecified: Secondary | ICD-10-CM | POA: Diagnosis not present

## 2019-01-20 DIAGNOSIS — E119 Type 2 diabetes mellitus without complications: Secondary | ICD-10-CM | POA: Diagnosis not present

## 2019-01-20 DIAGNOSIS — F329 Major depressive disorder, single episode, unspecified: Secondary | ICD-10-CM | POA: Diagnosis not present

## 2019-01-20 DIAGNOSIS — I890 Lymphedema, not elsewhere classified: Secondary | ICD-10-CM | POA: Diagnosis not present

## 2019-01-20 DIAGNOSIS — I110 Hypertensive heart disease with heart failure: Secondary | ICD-10-CM | POA: Diagnosis not present

## 2019-01-21 DIAGNOSIS — I890 Lymphedema, not elsewhere classified: Secondary | ICD-10-CM | POA: Diagnosis not present

## 2019-01-23 ENCOUNTER — Encounter (HOSPITAL_BASED_OUTPATIENT_CLINIC_OR_DEPARTMENT_OTHER): Payer: Medicare HMO | Attending: Internal Medicine

## 2019-01-23 DIAGNOSIS — S81802A Unspecified open wound, left lower leg, initial encounter: Secondary | ICD-10-CM | POA: Diagnosis not present

## 2019-01-23 DIAGNOSIS — Z09 Encounter for follow-up examination after completed treatment for conditions other than malignant neoplasm: Secondary | ICD-10-CM | POA: Diagnosis not present

## 2019-01-23 DIAGNOSIS — Z872 Personal history of diseases of the skin and subcutaneous tissue: Secondary | ICD-10-CM | POA: Insufficient documentation

## 2019-01-23 DIAGNOSIS — I890 Lymphedema, not elsewhere classified: Secondary | ICD-10-CM | POA: Insufficient documentation

## 2019-01-27 DIAGNOSIS — L97822 Non-pressure chronic ulcer of other part of left lower leg with fat layer exposed: Secondary | ICD-10-CM | POA: Diagnosis not present

## 2019-01-27 DIAGNOSIS — I890 Lymphedema, not elsewhere classified: Secondary | ICD-10-CM | POA: Diagnosis not present

## 2019-01-27 DIAGNOSIS — G894 Chronic pain syndrome: Secondary | ICD-10-CM | POA: Diagnosis not present

## 2019-01-27 DIAGNOSIS — F329 Major depressive disorder, single episode, unspecified: Secondary | ICD-10-CM | POA: Diagnosis not present

## 2019-01-27 DIAGNOSIS — I4891 Unspecified atrial fibrillation: Secondary | ICD-10-CM | POA: Diagnosis not present

## 2019-01-27 DIAGNOSIS — I509 Heart failure, unspecified: Secondary | ICD-10-CM | POA: Diagnosis not present

## 2019-01-27 DIAGNOSIS — J449 Chronic obstructive pulmonary disease, unspecified: Secondary | ICD-10-CM | POA: Diagnosis not present

## 2019-01-27 DIAGNOSIS — E119 Type 2 diabetes mellitus without complications: Secondary | ICD-10-CM | POA: Diagnosis not present

## 2019-01-27 DIAGNOSIS — I110 Hypertensive heart disease with heart failure: Secondary | ICD-10-CM | POA: Diagnosis not present

## 2019-02-07 DIAGNOSIS — I5020 Unspecified systolic (congestive) heart failure: Secondary | ICD-10-CM | POA: Diagnosis not present

## 2019-02-07 DIAGNOSIS — N189 Chronic kidney disease, unspecified: Secondary | ICD-10-CM | POA: Diagnosis not present

## 2019-02-07 DIAGNOSIS — I38 Endocarditis, valve unspecified: Secondary | ICD-10-CM | POA: Diagnosis not present

## 2019-02-07 DIAGNOSIS — E1122 Type 2 diabetes mellitus with diabetic chronic kidney disease: Secondary | ICD-10-CM | POA: Diagnosis not present

## 2019-02-07 DIAGNOSIS — G4733 Obstructive sleep apnea (adult) (pediatric): Secondary | ICD-10-CM | POA: Diagnosis not present

## 2019-02-07 DIAGNOSIS — I482 Chronic atrial fibrillation, unspecified: Secondary | ICD-10-CM | POA: Diagnosis not present

## 2019-02-07 DIAGNOSIS — E785 Hyperlipidemia, unspecified: Secondary | ICD-10-CM | POA: Diagnosis not present

## 2019-02-07 DIAGNOSIS — I10 Essential (primary) hypertension: Secondary | ICD-10-CM | POA: Diagnosis not present

## 2019-02-07 DIAGNOSIS — I890 Lymphedema, not elsewhere classified: Secondary | ICD-10-CM | POA: Diagnosis not present

## 2019-02-21 ENCOUNTER — Emergency Department (HOSPITAL_COMMUNITY): Payer: Medicare HMO

## 2019-02-21 ENCOUNTER — Inpatient Hospital Stay (HOSPITAL_COMMUNITY)
Admission: EM | Admit: 2019-02-21 | Discharge: 2019-02-24 | DRG: 637 | Disposition: A | Discharge status: Home Health | Payer: Medicare HMO | Attending: Internal Medicine | Admitting: Internal Medicine

## 2019-02-21 ENCOUNTER — Other Ambulatory Visit: Payer: Self-pay

## 2019-02-21 ENCOUNTER — Inpatient Hospital Stay (HOSPITAL_COMMUNITY): Payer: Medicare HMO

## 2019-02-21 ENCOUNTER — Encounter (HOSPITAL_COMMUNITY): Payer: Self-pay

## 2019-02-21 ENCOUNTER — Inpatient Hospital Stay: Payer: Self-pay

## 2019-02-21 DIAGNOSIS — Z9049 Acquired absence of other specified parts of digestive tract: Secondary | ICD-10-CM

## 2019-02-21 DIAGNOSIS — N189 Chronic kidney disease, unspecified: Secondary | ICD-10-CM | POA: Diagnosis present

## 2019-02-21 DIAGNOSIS — Z882 Allergy status to sulfonamides status: Secondary | ICD-10-CM

## 2019-02-21 DIAGNOSIS — E785 Hyperlipidemia, unspecified: Secondary | ICD-10-CM | POA: Diagnosis present

## 2019-02-21 DIAGNOSIS — K219 Gastro-esophageal reflux disease without esophagitis: Secondary | ICD-10-CM | POA: Diagnosis present

## 2019-02-21 DIAGNOSIS — E1143 Type 2 diabetes mellitus with diabetic autonomic (poly)neuropathy: Secondary | ICD-10-CM | POA: Diagnosis present

## 2019-02-21 DIAGNOSIS — I4891 Unspecified atrial fibrillation: Secondary | ICD-10-CM | POA: Diagnosis not present

## 2019-02-21 DIAGNOSIS — R112 Nausea with vomiting, unspecified: Secondary | ICD-10-CM | POA: Diagnosis present

## 2019-02-21 DIAGNOSIS — J449 Chronic obstructive pulmonary disease, unspecified: Secondary | ICD-10-CM | POA: Diagnosis present

## 2019-02-21 DIAGNOSIS — E876 Hypokalemia: Secondary | ICD-10-CM | POA: Diagnosis present

## 2019-02-21 DIAGNOSIS — I480 Paroxysmal atrial fibrillation: Secondary | ICD-10-CM | POA: Diagnosis present

## 2019-02-21 DIAGNOSIS — E1165 Type 2 diabetes mellitus with hyperglycemia: Secondary | ICD-10-CM | POA: Diagnosis not present

## 2019-02-21 DIAGNOSIS — Z7901 Long term (current) use of anticoagulants: Secondary | ICD-10-CM

## 2019-02-21 DIAGNOSIS — N183 Chronic kidney disease, stage 3 (moderate): Secondary | ICD-10-CM | POA: Diagnosis present

## 2019-02-21 DIAGNOSIS — Z6841 Body Mass Index (BMI) 40.0 and over, adult: Secondary | ICD-10-CM | POA: Diagnosis not present

## 2019-02-21 DIAGNOSIS — I130 Hypertensive heart and chronic kidney disease with heart failure and stage 1 through stage 4 chronic kidney disease, or unspecified chronic kidney disease: Secondary | ICD-10-CM | POA: Diagnosis not present

## 2019-02-21 DIAGNOSIS — Z1159 Encounter for screening for other viral diseases: Secondary | ICD-10-CM | POA: Diagnosis not present

## 2019-02-21 DIAGNOSIS — R109 Unspecified abdominal pain: Secondary | ICD-10-CM | POA: Diagnosis not present

## 2019-02-21 DIAGNOSIS — R0902 Hypoxemia: Secondary | ICD-10-CM | POA: Diagnosis not present

## 2019-02-21 DIAGNOSIS — I509 Heart failure, unspecified: Secondary | ICD-10-CM | POA: Diagnosis present

## 2019-02-21 DIAGNOSIS — E1122 Type 2 diabetes mellitus with diabetic chronic kidney disease: Secondary | ICD-10-CM | POA: Diagnosis present

## 2019-02-21 DIAGNOSIS — E1101 Type 2 diabetes mellitus with hyperosmolarity with coma: Secondary | ICD-10-CM | POA: Diagnosis not present

## 2019-02-21 DIAGNOSIS — Z8249 Family history of ischemic heart disease and other diseases of the circulatory system: Secondary | ICD-10-CM

## 2019-02-21 DIAGNOSIS — I4819 Other persistent atrial fibrillation: Secondary | ICD-10-CM | POA: Diagnosis not present

## 2019-02-21 DIAGNOSIS — R1032 Left lower quadrant pain: Secondary | ICD-10-CM

## 2019-02-21 DIAGNOSIS — G4733 Obstructive sleep apnea (adult) (pediatric): Secondary | ICD-10-CM | POA: Diagnosis present

## 2019-02-21 DIAGNOSIS — K3184 Gastroparesis: Secondary | ICD-10-CM | POA: Diagnosis present

## 2019-02-21 DIAGNOSIS — E860 Dehydration: Secondary | ICD-10-CM | POA: Diagnosis present

## 2019-02-21 DIAGNOSIS — Z452 Encounter for adjustment and management of vascular access device: Secondary | ICD-10-CM

## 2019-02-21 DIAGNOSIS — R197 Diarrhea, unspecified: Secondary | ICD-10-CM | POA: Diagnosis present

## 2019-02-21 DIAGNOSIS — Z79899 Other long term (current) drug therapy: Secondary | ICD-10-CM

## 2019-02-21 DIAGNOSIS — N179 Acute kidney failure, unspecified: Secondary | ICD-10-CM | POA: Diagnosis present

## 2019-02-21 DIAGNOSIS — I5031 Acute diastolic (congestive) heart failure: Secondary | ICD-10-CM | POA: Diagnosis present

## 2019-02-21 DIAGNOSIS — F329 Major depressive disorder, single episode, unspecified: Secondary | ICD-10-CM | POA: Diagnosis present

## 2019-02-21 DIAGNOSIS — Z90710 Acquired absence of both cervix and uterus: Secondary | ICD-10-CM

## 2019-02-21 DIAGNOSIS — R079 Chest pain, unspecified: Secondary | ICD-10-CM | POA: Diagnosis not present

## 2019-02-21 DIAGNOSIS — E871 Hypo-osmolality and hyponatremia: Secondary | ICD-10-CM | POA: Diagnosis present

## 2019-02-21 DIAGNOSIS — R000 Tachycardia, unspecified: Secondary | ICD-10-CM | POA: Diagnosis not present

## 2019-02-21 DIAGNOSIS — R0789 Other chest pain: Secondary | ICD-10-CM | POA: Diagnosis not present

## 2019-02-21 DIAGNOSIS — I5033 Acute on chronic diastolic (congestive) heart failure: Secondary | ICD-10-CM | POA: Diagnosis not present

## 2019-02-21 DIAGNOSIS — E119 Type 2 diabetes mellitus without complications: Secondary | ICD-10-CM

## 2019-02-21 DIAGNOSIS — Z794 Long term (current) use of insulin: Secondary | ICD-10-CM

## 2019-02-21 DIAGNOSIS — E1169 Type 2 diabetes mellitus with other specified complication: Secondary | ICD-10-CM

## 2019-02-21 DIAGNOSIS — R739 Hyperglycemia, unspecified: Secondary | ICD-10-CM

## 2019-02-21 LAB — GLUCOSE, CAPILLARY
Glucose-Capillary: 509 mg/dL (ref 70–99)
Glucose-Capillary: >600 mg/dL (ref 70–99)
Glucose-Capillary: 582 mg/dL (ref 70–99)
Glucose-Capillary: 459 mg/dL — ABNORMAL HIGH (ref 70–99)
Glucose-Capillary: 374 mg/dL — ABNORMAL HIGH (ref 70–99)

## 2019-02-21 LAB — CBC WITH DIFFERENTIAL/PLATELET
Abs Immature Granulocytes: 0.04 10*3/uL (ref 0.00–0.07)
Basophils Absolute: 0 10*3/uL (ref 0.0–0.1)
Basophils Relative: 0 %
Eosinophils Absolute: 0.1 10*3/uL (ref 0.0–0.5)
Eosinophils Relative: 1 %
HCT: 49.2 % — ABNORMAL HIGH (ref 36.0–46.0)
Hemoglobin: 16.7 g/dL — ABNORMAL HIGH (ref 12.0–15.0)
Immature Granulocytes: 0 %
Lymphocytes Relative: 21 %
Lymphs Abs: 1.9 10*3/uL (ref 0.7–4.0)
MCH: 29.6 pg (ref 26.0–34.0)
MCHC: 33.9 g/dL (ref 30.0–36.0)
MCV: 87.1 fL (ref 80.0–100.0)
Monocytes Absolute: 1 10*3/uL (ref 0.1–1.0)
Monocytes Relative: 11 %
Neutro Abs: 5.9 10*3/uL (ref 1.7–7.7)
Neutrophils Relative %: 67 %
Platelets: 181 10*3/uL (ref 150–400)
RBC: 5.65 MIL/uL — ABNORMAL HIGH (ref 3.87–5.11)
RDW: 12.7 % (ref 11.5–15.5)
WBC: 9 10*3/uL (ref 4.0–10.5)
nRBC: 0 % (ref 0.0–0.2)

## 2019-02-21 LAB — COMPREHENSIVE METABOLIC PANEL
ALT: 17 U/L (ref 0–44)
AST: 28 U/L (ref 15–41)
Albumin: 3.5 g/dL (ref 3.5–5.0)
Alkaline Phosphatase: 63 U/L (ref 38–126)
Anion gap: 16 — ABNORMAL HIGH (ref 5–15)
BUN: 30 mg/dL — ABNORMAL HIGH (ref 8–23)
CO2: 31 mmol/L (ref 22–32)
Calcium: 8.9 mg/dL (ref 8.9–10.3)
Chloride: 79 mmol/L — ABNORMAL LOW (ref 98–111)
Creatinine, Ser: 2.14 mg/dL — ABNORMAL HIGH (ref 0.44–1.00)
GFR calc Af Amer: 28 mL/min — ABNORMAL LOW (ref >60)
GFR calc non Af Amer: 24 mL/min — ABNORMAL LOW (ref >60)
Glucose, Bld: 676 mg/dL (ref 70–99)
Potassium: 3.2 mmol/L — ABNORMAL LOW (ref 3.5–5.1)
Sodium: 126 mmol/L — ABNORMAL LOW (ref 135–145)
Total Bilirubin: 1.9 mg/dL — ABNORMAL HIGH (ref 0.3–1.2)
Total Protein: 9 g/dL — ABNORMAL HIGH (ref 6.5–8.1)

## 2019-02-21 LAB — TROPONIN I (HIGH SENSITIVITY)
Troponin I (High Sensitivity): 11 ng/L (ref <18)
Troponin I (High Sensitivity): 10 ng/L (ref <18)

## 2019-02-21 LAB — CBG MONITORING, ED
Glucose-Capillary: >600 mg/dL (ref 70–99)
Glucose-Capillary: 575 mg/dL (ref 70–99)

## 2019-02-21 LAB — MRSA PCR SCREENING: MRSA by PCR: NEGATIVE

## 2019-02-21 LAB — MAGNESIUM: Magnesium: 1.8 mg/dL (ref 1.7–2.4)

## 2019-02-21 LAB — BRAIN NATRIURETIC PEPTIDE: B Natriuretic Peptide: 29.1 pg/mL (ref 0.0–100.0)

## 2019-02-21 LAB — LIPASE, BLOOD: Lipase: 26 U/L (ref 11–51)

## 2019-02-21 LAB — SARS CORONAVIRUS 2 BY RT PCR (HOSPITAL ORDER, PERFORMED IN ~~LOC~~ HOSPITAL LAB): SARS Coronavirus 2: NEGATIVE

## 2019-02-21 MED ORDER — ACETAMINOPHEN 325 MG PO TABS: 650.0000 mg | ORAL_TABLET | Freq: Four times a day (QID) | ORAL | Status: DC | PRN

## 2019-02-21 MED ORDER — INSULIN REGULAR(HUMAN) IN NACL 100-0.9 UT/100ML-% IV SOLN
INTRAVENOUS | Status: DC
  Administered 2019-02-21 (×2): 5.4 [IU]/h via INTRAVENOUS
  Filled 2019-02-21: qty 100

## 2019-02-21 MED ORDER — ONDANSETRON HCL 4 MG/2ML IJ SOLN
4.0000 mg | Freq: Four times a day (QID) | INTRAMUSCULAR | Status: DC | PRN
  Filled 2019-02-21 (×2): qty 2

## 2019-02-21 MED ORDER — ADULT MULTIVITAMIN W/MINERALS CH
1.0000 | ORAL_TABLET | Freq: Every day | ORAL | Status: DC
  Administered 2019-02-21 – 2019-02-24 (×4): 1 via ORAL
  Filled 2019-02-21 (×4): qty 1

## 2019-02-21 MED ORDER — SODIUM CHLORIDE 0.9% FLUSH
10.0000 mL | Freq: Two times a day (BID) | INTRAVENOUS | Status: DC
  Administered 2019-02-21 – 2019-02-23 (×3): 10 mL

## 2019-02-21 MED ORDER — IOHEXOL 300 MG/ML  SOLN
100.0000 mL | Freq: Once | INTRAMUSCULAR | Status: AC | PRN
  Administered 2019-02-21: 100 mL via INTRAVENOUS

## 2019-02-21 MED ORDER — SODIUM CHLORIDE 0.9 % IV BOLUS
500.0000 mL | Freq: Once | INTRAVENOUS | Status: AC
  Administered 2019-02-21: 500 mL via INTRAVENOUS

## 2019-02-21 MED ORDER — POTASSIUM CHLORIDE 10 MEQ/100ML IV SOLN
10.0000 meq | INTRAVENOUS | Status: AC
  Administered 2019-02-21: 10 meq via INTRAVENOUS
  Filled 2019-02-21: qty 100

## 2019-02-21 MED ORDER — NITROGLYCERIN 0.4 MG SL SUBL: 0.4000 mg | SUBLINGUAL_TABLET | SUBLINGUAL | Status: DC | PRN

## 2019-02-21 MED ORDER — ALUM & MAG HYDROXIDE-SIMETH 200-200-20 MG/5ML PO SUSP
30.0000 mL | Freq: Once | ORAL | Status: DC
  Filled 2019-02-21 (×2): qty 30

## 2019-02-21 MED ORDER — POTASSIUM CHLORIDE CRYS ER 20 MEQ PO TBCR
20.0000 meq | EXTENDED_RELEASE_TABLET | Freq: Every day | ORAL | Status: DC
  Administered 2019-02-21 – 2019-02-24 (×4): 20 meq via ORAL
  Filled 2019-02-21 (×4): qty 1

## 2019-02-21 MED ORDER — CYCLOSPORINE 0.05 % OP EMUL
1.0000 [drp] | Freq: Two times a day (BID) | OPHTHALMIC | Status: DC
  Administered 2019-02-21 – 2019-02-22 (×3): 1 [drp] via OPHTHALMIC
  Administered 2019-02-23: 08:00:00 via OPHTHALMIC
  Administered 2019-02-23 – 2019-02-24 (×2): 1 [drp] via OPHTHALMIC
  Filled 2019-02-21 (×7): qty 30

## 2019-02-21 MED ORDER — DILTIAZEM HCL 25 MG/5ML IV SOLN
5.0000 mg | Freq: Once | INTRAVENOUS | Status: AC
  Administered 2019-02-21: 5 mg via INTRAVENOUS
  Filled 2019-02-21: qty 5

## 2019-02-21 MED ORDER — GABAPENTIN 300 MG PO CAPS
300.0000 mg | ORAL_CAPSULE | Freq: Four times a day (QID) | ORAL | Status: DC
  Administered 2019-02-21 – 2019-02-24 (×11): 300 mg via ORAL
  Filled 2019-02-21 (×12): qty 1

## 2019-02-21 MED ORDER — PANTOPRAZOLE SODIUM 40 MG PO TBEC
40.0000 mg | DELAYED_RELEASE_TABLET | Freq: Two times a day (BID) | ORAL | Status: DC
  Administered 2019-02-21 – 2019-02-24 (×6): 40 mg via ORAL
  Filled 2019-02-21 (×6): qty 1

## 2019-02-21 MED ORDER — SODIUM CHLORIDE 0.9 % IV SOLN
INTRAVENOUS | Status: DC
  Administered 2019-02-22 – 2019-02-23 (×3): via INTRAVENOUS
  Administered 2019-02-24: 75 mL/h via INTRAVENOUS

## 2019-02-21 MED ORDER — DILTIAZEM HCL-DEXTROSE 100-5 MG/100ML-% IV SOLN (PREMIX)
5.0000 mg/h | INTRAVENOUS | Status: DC
  Administered 2019-02-21 – 2019-02-23 (×3): 5 mg/h via INTRAVENOUS
  Filled 2019-02-21 (×3): qty 100

## 2019-02-21 MED ORDER — METOPROLOL TARTRATE 25 MG PO TABS
100.0000 mg | ORAL_TABLET | Freq: Two times a day (BID) | ORAL | Status: DC
  Administered 2019-02-21 – 2019-02-23 (×4): 100 mg via ORAL
  Filled 2019-02-21: qty 4
  Filled 2019-02-21: qty 8
  Filled 2019-02-21 (×2): qty 4

## 2019-02-21 MED ORDER — LOSARTAN POTASSIUM 50 MG PO TABS
25.0000 mg | ORAL_TABLET | Freq: Every day | ORAL | Status: DC
  Administered 2019-02-21 – 2019-02-23 (×3): 25 mg via ORAL
  Filled 2019-02-21 (×3): qty 1

## 2019-02-21 MED ORDER — SODIUM CHLORIDE 0.9% FLUSH: 10.0000 mL | INTRAVENOUS | Status: DC | PRN

## 2019-02-21 MED ORDER — INSULIN DETEMIR 100 UNIT/ML ~~LOC~~ SOLN: 15.0000 [IU] | Freq: Two times a day (BID) | SUBCUTANEOUS | Status: DC

## 2019-02-21 MED ORDER — DIGOXIN 125 MCG PO TABS
125.0000 ug | ORAL_TABLET | Freq: Every day | ORAL | Status: DC
  Administered 2019-02-21 – 2019-02-24 (×4): 125 ug via ORAL
  Filled 2019-02-21 (×4): qty 1

## 2019-02-21 MED ORDER — DABIGATRAN ETEXILATE MESYLATE 75 MG PO CAPS
75.0000 mg | ORAL_CAPSULE | Freq: Two times a day (BID) | ORAL | Status: DC
  Administered 2019-02-21 – 2019-02-24 (×6): 75 mg via ORAL
  Filled 2019-02-21 (×7): qty 1

## 2019-02-21 MED ORDER — LIDOCAINE VISCOUS HCL 2 % MT SOLN
15.0000 mL | Freq: Once | OROMUCOSAL | Status: DC
  Filled 2019-02-21: qty 15

## 2019-02-21 MED ORDER — OXYCODONE HCL 5 MG PO TABS
5.0000 mg | ORAL_TABLET | ORAL | Status: DC | PRN
  Administered 2019-02-22: 5 mg via ORAL
  Filled 2019-02-21: qty 1

## 2019-02-21 MED ORDER — POTASSIUM CHLORIDE 10 MEQ/100ML IV SOLN
10.0000 meq | INTRAVENOUS | Status: AC
  Filled 2019-02-21: qty 100

## 2019-02-21 MED ORDER — INSULIN ASPART 100 UNIT/ML ~~LOC~~ SOLN
10.0000 [IU] | Freq: Once | SUBCUTANEOUS | Status: AC
  Administered 2019-02-21: 10 [IU] via INTRAVENOUS

## 2019-02-21 MED ORDER — DULOXETINE HCL 60 MG PO CPEP
60.0000 mg | ORAL_CAPSULE | Freq: Every day | ORAL | Status: DC
  Administered 2019-02-21 – 2019-02-24 (×4): 60 mg via ORAL
  Filled 2019-02-21 (×4): qty 1

## 2019-02-21 MED ORDER — ALBUTEROL SULFATE (2.5 MG/3ML) 0.083% IN NEBU: 3.0000 mL | INHALATION_SOLUTION | RESPIRATORY_TRACT | Status: DC | PRN

## 2019-02-21 MED ORDER — DEXTROSE-NACL 5-0.45 % IV SOLN
INTRAVENOUS | Status: DC
  Administered 2019-02-21: 19:00:00 150 mL/h via INTRAVENOUS
  Administered 2019-02-22: 02:00:00 via INTRAVENOUS

## 2019-02-21 NOTE — ED Triage Notes (Signed)
Pt stated she has been having "crushing" CP as well as n/v & diarrhea since Sunday. She does endorse having a Hx with acid reflux & denies any radiation of the CP.

## 2019-02-21 NOTE — ED Notes (Signed)
Patient transported to CT 

## 2019-02-21 NOTE — ED Provider Notes (Addendum)
[facility] EMERGENCY DEPARTMENT Provider Note   CSN: [id number] Arrival date & time: 02/21/19  1103    History   Chief Complaint Chief Complaint  Patient presents with   Chest Pain    HPI [name] is a 63 y.o. female.     HPI Patient states that for the last 6 days she has had abdominal pain especially in the left lower quadrant.  This is been associated with multiple episodes of vomiting and some loose stool.  She denies any blood in either the vomit or the stool.  States she is been able unable to keep her medication down.  Endorses subjective fever and chills.  States that she is developed central chest pain after multiple bouts of vomiting.  Has some radiation of the chest pain to the left arm.  Also endorses rapid palpitations.  Has a history of atrial fibrillation has not been able to keep her Pradaxa down.  Patient endorses mild cough with some sputum production.  She has increased swelling to bilateral lower extremities.    Past Medical History:  Diagnosis Date   Allergic rhinitis    Arthritis    Asthma    Chest pain 12/27/2017   Chronic diastolic CHF (congestive heart failure) (HCC)    COPD (chronic obstructive pulmonary disease) (HCC)    Depression    DM (diabetes mellitus) ([place])    DVT (deep vein thrombosis) in pregnancy    Dysrhythmia    atrial fibrilation   GERD (gastroesophageal reflux disease)    Headache(784.0)    HTN (hypertension)    Hyperlipidemia    Obesity    Pneumonia 04/2018   RIGHT LOBE   Shortness of breath    Sleep apnea    compliant with CPAP    Patient Active Problem List   Diagnosis Date Noted   Acute on chronic left systolic heart failure ([place]) 07/01/2018   Congestive heart failure with left ventricular diastolic dysfunction, acute ([place]) 07/01/2018   Right upper lobe pneumonia ([place]) 05/14/2018   Atrial fibrillation with RVR ([place]) 05/14/2018   Chronic atrial fibrillation 51/88/4166    Diastolic dysfunction 02/18/1600   Diabetes mellitus type 2 in obese ([place]) 12/26/2017   Nausea vomiting and diarrhea    Chest pain 02/26/2012   OSA (obstructive sleep apnea) 09/21/2011   Hypoxia 09/21/2011   Diabetes mellitus ([place]) 02/04/2010   Morbid obesity ([place]) 02/04/2010   Atrial fibrillation ([place]) 02/04/2010   GASTROPARESIS 02/04/2010   GASTROENTERITIS 02/04/2010    Past Surgical History:  Procedure Laterality Date   ABDOMINAL HYSTERECTOMY     CATARACT EXTRACTION, BILATERAL     CHOLECYSTECTOMY     COLONOSCOPY WITH PROPOFOL N/A 03/05/2015   Procedure: COLONOSCOPY WITH PROPOFOL;  Surgeon: Carol Ada, MD;  Location: [facility];  Service: Endoscopy;  Laterality: N/A;   DIAGNOSTIC LAPAROSCOPY     ingrown hallux Left    KNEE SURGERY     LEFT HEART CATH AND CORONARY ANGIOGRAPHY N/A 12/31/2017   Procedure: LEFT HEART CATH AND CORONARY ANGIOGRAPHY;  Surgeon: Charolette Forward, MD;  Location: [facility];  Service: Cardiovascular;  Laterality: N/A;     OB History   No obstetric history on file.      Home Medications    Prior to Admission medications   Medication Sig Start Date End Date Taking? Authorizing Provider  acetaminophen (TYLENOL) 325 MG tablet Take 2 tablets (650 mg total) by mouth every 6 (six) hours as needed for mild pain, moderate pain or  headache. 05/20/18  Yes Charolette Forward, MD  digoxin (LANOXIN) 0.125 MG tablet Take 1 tablet (125 mcg total) by mouth daily. 07/04/18 07/04/19 Yes Charolette Forward, MD  DULoxetine (CYMBALTA) 60 MG capsule Take 1 capsule (60 mg total) by mouth daily. 05/20/18  Yes Charolette Forward, MD  furosemide (LASIX) 40 MG tablet Take 1 tablet (40 mg total) by mouth 2 (two) times daily. 07/04/18 07/04/19 Yes Charolette Forward, MD  gabapentin (NEURONTIN) 300 MG capsule Take 300 mg by mouth 4 (four) times daily.  09/13/11  Yes [provider]  insulin aspart (NOVOLOG) 100 UNIT/ML injection Inject 0-10 Units into the skin.  PER SLIDING SCALE   Yes [provider]  insulin detemir (LEVEMIR) 100 UNIT/ML injection Inject 0.15 mLs (15 Units total) into the skin 2 (two) times daily. 07/04/18  Yes Charolette Forward, MD  JANUVIA 50 MG tablet Take 50 mg by mouth daily. 10/17/18  Yes [provider]  losartan (COZAAR) 25 MG tablet Take 1 tablet (25 mg total) by mouth daily. 07/04/18  Yes Charolette Forward, MD  metoprolol tartrate (LOPRESSOR) 100 MG tablet Take 100 mg by mouth 2 (two) times daily. 10/07/18  Yes [provider]  Multiple Vitamins-Minerals (MULTIVITAMIN WOMEN PO) Take 1 tablet by mouth daily.   Yes [provider]  nitroGLYCERIN (NITROSTAT) 0.4 MG SL tablet Place 1 tablet (0.4 mg total) under the tongue every 5 (five) minutes x 3 doses as needed for chest pain. 01/08/14  Yes Charolette Forward, MD  pantoprazole (PROTONIX) 40 MG tablet Take 1 tablet (40 mg total) by mouth 2 (two) times daily. 01/01/18  Yes Regalado, Belkys A, MD  potassium chloride SA (K-DUR,KLOR-CON) 20 MEQ tablet Take 1 tablet (20 mEq total) by mouth daily. 07/04/18  Yes Charolette Forward, MD  PRADAXA 75 MG CAPS capsule Take 75 mg by mouth 2 (two) times daily. 10/23/18  Yes [provider]  PROAIR HFA 108 (90 BASE) MCG/ACT inhaler Inhale 2 puffs into the lungs every 4 (four) hours as needed for wheezing (wheezing).  05/28/12  Yes [provider]  RESTASIS 0.05 % ophthalmic emulsion Place 1 drop into both eyes 2 (two) times daily. 11/19/17  Yes [provider]  dabigatran (PRADAXA) 150 MG CAPS capsule Take 1 capsule (150 mg total) by mouth every 12 (twelve) hours. Patient not taking: Reported on 02/21/2019 04/12/16   Florencia Reasons, MD  metoprolol tartrate (LOPRESSOR) 50 MG tablet Take 1 tablet (50 mg total) by mouth 3 (three) times daily. Patient not taking: Reported on 02/21/2019 07/04/18   Charolette Forward, MD    Family History Family History  Problem Relation Age of Onset   Cancer Mother    Hypertension  Mother    Cancer Father    CAD Other    Hypertension Sister    Hypertension Brother     Social History Social History   Tobacco Use   Smoking status: Never Smoker   Smokeless tobacco: Never Used  Substance Use Topics   Alcohol use: No   Drug use: No     Allergies   Sulfa antibiotics   Review of Systems Review of Systems  Constitutional: Positive for appetite change, chills, fatigue and fever.  HENT: Negative for sore throat and trouble swallowing.   Eyes: Negative for visual disturbance.  Respiratory: Positive for cough. Negative for shortness of breath.   Cardiovascular: Positive for chest pain, palpitations and leg swelling.  Gastrointestinal: Positive for abdominal pain, diarrhea, nausea and vomiting. Negative for blood in stool.  Genitourinary: Negative for dysuria, flank pain and frequency.  Musculoskeletal: Negative for back pain, myalgias and neck pain.  Skin: Negative for rash and wound.  Neurological: Negative for dizziness, weakness, light-headedness, numbness and headaches.  All other systems reviewed and are negative.    Physical Exam Updated Vital Signs BP (!) 113/91    Pulse 70    Temp 98.1 F (36.7 C) (Oral)    Resp 20    SpO2 98%   Physical Exam Vitals signs and nursing note reviewed.  Constitutional:      Appearance: Normal appearance. She is well-developed.  HENT:     Head: Normocephalic and atraumatic.     Nose: Nose normal.     Mouth/Throat:     Mouth: Mucous membranes are moist.  Eyes:     Extraocular Movements: Extraocular movements intact.     Pupils: Pupils are equal, round, and reactive to light.  Neck:     Musculoskeletal: Normal range of motion and neck supple. No neck rigidity or muscular tenderness.  Cardiovascular:     Rate and Rhythm: Tachycardia present. Rhythm irregular.     Heart sounds: Murmur present.  Pulmonary:     Effort: Pulmonary effort is normal. No respiratory distress.     Breath sounds: Normal breath  sounds. No stridor. No wheezing, rhonchi or rales.  Chest:     Chest wall: No tenderness.  Abdominal:     General: Bowel sounds are normal. There is no distension.     Palpations: Abdomen is soft.     Tenderness: There is abdominal tenderness. There is no right CVA tenderness, left CVA tenderness, guarding or rebound.     Comments: Epigastric, left upper quadrant and left lower quadrant tenderness to palpation.  No definite rebound or guarding.  Musculoskeletal: Normal range of motion.        General: Swelling and tenderness present.     Comments: Bilateral lower extremity swelling and generalized tenderness to palpation.  No pitting edema.  No obvious asymmetry.  Distal pulses intact.  Lymphadenopathy:     Cervical: No cervical adenopathy.  Skin:    General: Skin is warm and dry.     Capillary Refill: Capillary refill takes less than 2 seconds.     Findings: No erythema or rash.  Neurological:     General: No focal deficit present.     Mental Status: She is alert and oriented to person, place, and time.     Comments: Moving all extremities without focal deficit.  Sensation intact.  Psychiatric:        Behavior: Behavior normal.      ED Treatments / Results  Labs (all labs ordered are listed, but only abnormal results are displayed) Labs Reviewed  CBC WITH DIFFERENTIAL/PLATELET - Abnormal; Notable for the following components:      Result Value   RBC 5.65 (*)    Hemoglobin 16.7 (*)    HCT 49.2 (*)    All other components within normal limits  COMPREHENSIVE METABOLIC PANEL - Abnormal; Notable for the following components:   Sodium 126 (*)    Potassium 3.2 (*)    Chloride 79 (*)    Glucose, Bld 676 (*)    BUN 30 (*)    Creatinine, Ser 2.14 (*)    Total Protein 9.0 (*)    Total Bilirubin 1.9 (*)    GFR calc non Af Amer 24 (*)    GFR calc Af Amer 28 (*)    Anion gap 16 (*)  All other components within normal limits  CBG MONITORING, ED - Abnormal; Notable for the  following components:   Glucose-Capillary >600 (*)    All other components within normal limits  CBG MONITORING, ED - Abnormal; Notable for the following components:   Glucose-Capillary 575 (*)    All other components within normal limits  SARS CORONAVIRUS 2 (HOSPITAL ORDER, [facility])  LIPASE, BLOOD  BRAIN NATRIURETIC PEPTIDE  MAGNESIUM  TROPONIN I (HIGH SENSITIVITY)  TROPONIN I (HIGH SENSITIVITY)  URINALYSIS, ROUTINE W REFLEX MICROSCOPIC    EKG EKG Interpretation  Date/Time:  Friday February 21 2019 11:04:31 EDT Ventricular Rate:  127 PR Interval:    QRS Duration: 106 QT Interval:  329 QTC Calculation: 479 R Axis:   146 Text Interpretation:  Atrial fibrillation Ventricular premature complex Probable anterior infarct, age indeterminate Lateral leads are also involved Confirmed by Julianne Rice [phone]) on 02/21/2019 11:10:01 AM   Radiology Ct Abdomen Pelvis W Contrast  Result Date: 02/21/2019 CLINICAL DATA:  Left lower quadrant pain. EXAM: CT ABDOMEN AND PELVIS WITH CONTRAST TECHNIQUE: Multidetector CT imaging of the abdomen and pelvis was performed using the standard protocol following bolus administration of intravenous contrast. CONTRAST:  116mL OMNIPAQUE IOHEXOL 300 MG/ML  SOLN COMPARISON:  CT abdomen pelvis dated June 30, 2018. FINDINGS: Lower chest: No acute abnormality. Hepatobiliary: No focal liver abnormality is seen. Status post cholecystectomy. No biliary dilatation. Pancreas: Unremarkable. No pancreatic ductal dilatation or surrounding inflammatory changes. Spleen: Normal in size without focal abnormality. Adrenals/Urinary Tract: The adrenal glands are unremarkable. No renal mass, calculi, or hydronephrosis. The bladder is unremarkable for the degree of distention. Stomach/Bowel: Stomach is within normal limits. Appendix appears normal. No evidence of bowel wall thickening, distention, or inflammatory changes. 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 No enlarged abdominal or pelvic lymph nodes. Reproductive: Status post hysterectomy. Unchanged 3.6 cm complex cyst in the left ovary, stable since 2011, consistent with benign etiology. The right ovary is unremarkable. Other: No abdominal wall hernia or abnormality. No abdominopelvic ascites. No pneumoperitoneum. Musculoskeletal: No acute or significant osseous findings. IMPRESSION: 1.  No acute intra-abdominal process. 2. 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 3. Unchanged 3.6 cm complex cyst in the left ovary, stable since 2011. Electronically Signed   By: Titus Dubin M.D.   On: 02/21/2019 12:44   Dg Chest Port 1 View  Result Date: 02/21/2019 CLINICAL DATA:  Crushing chest pain EXAM: PORTABLE CHEST 1 VIEW COMPARISON:  September 02, 2018 FINDINGS: The mediastinal contour is normal. The heart size is enlarged. There is no focal infiltrate, pulmonary edema, or pleural effusion. The visualized skeletal structures are stable. IMPRESSION: No active cardiopulmonary disease. Electronically Signed   By: Abelardo Diesel M.D.   On: 02/21/2019 13:20    Procedures Procedures (including critical care time)  Medications Ordered in ED Medications  diltiazem (CARDIZEM) 100 mg in dextrose 5% 149mL (1 mg/mL) infusion (5 mg/hr Intravenous New Bag/Given 02/21/19 1327)  potassium chloride 10 mEq in 100 mL IVPB (has no administration in time range)  insulin aspart (novoLOG) injection 10 Units (10 Units Intravenous Given 02/21/19 1141)  sodium chloride 0.9 % bolus 500 mL (500 mLs Intravenous New Bag/Given 02/21/19 1146)  diltiazem (CARDIZEM) injection 5 mg (5 mg Intravenous Given 02/21/19 1141)  iohexol (OMNIPAQUE) 300 MG/ML solution 100 mL (100 mLs Intravenous Contrast Given 02/21/19 1203)  sodium chloride 0.9 %  bolus 500 mL (500 mLs Intravenous New Bag/Given 02/21/19 1324)   CRITICAL CARE Performed by: Julianne Rice Total critical care time: 30 minutes Critical care time was exclusive of separately billable procedures and treating other patients. Critical care was necessary to treat or prevent imminent or life-threatening deterioration. Critical care was time spent personally by me on the following activities: development of treatment plan with patient and/or surrogate as well as nursing, discussions with consultants, evaluation of patient's response to treatment, examination of patient, obtaining history from patient or surrogate, ordering and performing treatments and interventions, ordering and review of laboratory studies, ordering and review of radiographic studies, pulse oximetry and re-evaluation of patient's condition.  Initial Impression / Assessment and Plan / ED Course  I have reviewed the triage vital signs and the nursing notes.  Pertinent labs & imaging results that were available during my care of the patient were reviewed by me and considered in my medical decision making (see chart for details).        Reviewed patient with cardiac catheterization last year with no significant coronary artery disease.  CT abdomen pelvis without acute findings.  Initial troponin is normal.  Given dose of IV Cardizem, insulin and IV fluids.  Patient remains tachycardic.  Will start on Cardizem drip.  Discussed with Dr. Terrence Dupont.  Agrees with treatment plan and advises admit to hospitalist.  Dr. Evangeline Gula and will admit.  Final Clinical Impressions(s) / ED Diagnoses   Final diagnoses:  Persistent atrial fibrillation with RVR  Hyperglycemia  Left lower quadrant abdominal pain  Atypical chest pain    ED Discharge Orders    None       Julianne Rice, MD 02/21/19 1402    Julianne Rice, MD 02/21/19 1413

## 2019-02-21 NOTE — H&P (Signed)
History and Physical    [name] IDP:[id number] DOB: 04-07-1956 DOA: 02/21/2019  PCP: Charolette Forward, MD  Patient coming from: home  I have personally briefly reviewed patient's old medical records in [place]  Chief Complaint: Chest pain as well as nausea vomiting diarrhea since Sunday.  HPI: [name] is a 63 y.o. female with medical history significant of chronic diastolic congestive heart failure, COPD, super morbid obesity, sleep apnea, hypertension, hyperlipidemia, gastroesophageal reflux disease, diabetes type 2 atrial fibrillation on Pradaxa who presents complaining of nausea vomiting and diarrhea since Sunday.  Once that her nausea and vomiting said and she developed crushing chest pain.  She feels that the acid reflux from the vomiting is causing her chest pain.  He is unable to keep down her Pradaxa or any of her medications.  Other discomfort in her abdomen in the left lower quadrant and the right upper quadrant.  She has no blood in vomit or stool, she has had some rapid palpitations and she has been unable to hold down her metoprolol.  He has had some mild cough with some sputum production but does not feel particularly short of breath.  Her bilateral lower extremities have been swelling lately.  However on exam today they were not swollen.  She states blood glucoses have been running high at home for 5 or 6 days.  She has not made any interventions regarding her blood glucoses.  ED Course: Blood glucose 676 creatinine 2.14, sodium 126, potassium 3.2, EKG shows A. fib at 127 bpm, chest x-ray is negative, is normal, BNP is normal, high-sensitivity troponin is within normal range, cell count is normal, hemoglobin is 16.7 suggestive of dehydration.  Is referred to me for further evaluation and management of nausea vomiting diarrhea and HH NK.  Review of Systems: As per HPI otherwise all other systems reviewed and  negative.   Past Medical History:  Diagnosis  Date   Allergic rhinitis    Arthritis    Asthma    Chest pain 12/27/2017   Chronic diastolic CHF (congestive heart failure) (HCC)    COPD (chronic obstructive pulmonary disease) (HCC)    Depression    DM (diabetes mellitus) ([place])    DVT (deep vein thrombosis) in pregnancy    Dysrhythmia    atrial fibrilation   GERD (gastroesophageal reflux disease)    Headache(784.0)    HTN (hypertension)    Hyperlipidemia    Obesity    Pneumonia 04/2018   RIGHT LOBE   Shortness of breath    Sleep apnea    compliant with CPAP    Past Surgical History:  Procedure Laterality Date   ABDOMINAL HYSTERECTOMY     CATARACT EXTRACTION, BILATERAL     CHOLECYSTECTOMY     COLONOSCOPY WITH PROPOFOL N/A 03/05/2015   Procedure: COLONOSCOPY WITH PROPOFOL;  Surgeon: Carol Ada, MD;  Location: [facility];  Service: Endoscopy;  Laterality: N/A;   DIAGNOSTIC LAPAROSCOPY     ingrown hallux Left    KNEE SURGERY     LEFT HEART CATH AND CORONARY ANGIOGRAPHY N/A 12/31/2017   Procedure: LEFT HEART CATH AND CORONARY ANGIOGRAPHY;  Surgeon: Charolette Forward, MD;  Location: [facility];  Service: Cardiovascular;  Laterality: N/A;    Social History   Social History Narrative   Pt lives in [place] with spouse.  2 grown children.   Previously worked in [employer] at [employer].  Now on disability     reports that she has never smoked. She has  never used smokeless tobacco. She reports that she does not drink alcohol or use drugs.  Allergies  Allergen Reactions   Sulfa Antibiotics Itching    Family History  Problem Relation Age of Onset   Cancer Mother    Hypertension Mother    Cancer Father    CAD Other    Hypertension Sister    Hypertension Brother      Prior to Admission medications   Medication Sig Start Date End Date Taking? Authorizing Provider  acetaminophen (TYLENOL) 325 MG tablet Take 2 tablets (650 mg total) by mouth every 6 (six) hours as needed for mild  pain, moderate pain or headache. 05/20/18  Yes Charolette Forward, MD  digoxin (LANOXIN) 0.125 MG tablet Take 1 tablet (125 mcg total) by mouth daily. 07/04/18 07/04/19 Yes Charolette Forward, MD  DULoxetine (CYMBALTA) 60 MG capsule Take 1 capsule (60 mg total) by mouth daily. 05/20/18  Yes Charolette Forward, MD  furosemide (LASIX) 40 MG tablet Take 1 tablet (40 mg total) by mouth 2 (two) times daily. 07/04/18 07/04/19 Yes Charolette Forward, MD  gabapentin (NEURONTIN) 300 MG capsule Take 300 mg by mouth 4 (four) times daily.  09/13/11  Yes [provider]  insulin aspart (NOVOLOG) 100 UNIT/ML injection Inject 0-10 Units into the skin. PER SLIDING SCALE   Yes [provider]  insulin detemir (LEVEMIR) 100 UNIT/ML injection Inject 0.15 mLs (15 Units total) into the skin 2 (two) times daily. 07/04/18  Yes Charolette Forward, MD  JANUVIA 50 MG tablet Take 50 mg by mouth daily. 10/17/18  Yes [provider]  losartan (COZAAR) 25 MG tablet Take 1 tablet (25 mg total) by mouth daily. 07/04/18  Yes Charolette Forward, MD  metoprolol tartrate (LOPRESSOR) 100 MG tablet Take 100 mg by mouth 2 (two) times daily. 10/07/18  Yes [provider]  Multiple Vitamins-Minerals (MULTIVITAMIN WOMEN PO) Take 1 tablet by mouth daily.   Yes [provider]  nitroGLYCERIN (NITROSTAT) 0.4 MG SL tablet Place 1 tablet (0.4 mg total) under the tongue every 5 (five) minutes x 3 doses as needed for chest pain. 01/08/14  Yes Charolette Forward, MD  pantoprazole (PROTONIX) 40 MG tablet Take 1 tablet (40 mg total) by mouth 2 (two) times daily. 01/01/18  Yes Regalado, Belkys A, MD  potassium chloride SA (K-DUR,KLOR-CON) 20 MEQ tablet Take 1 tablet (20 mEq total) by mouth daily. 07/04/18  Yes Charolette Forward, MD  PRADAXA 75 MG CAPS capsule Take 75 mg by mouth 2 (two) times daily. 10/23/18  Yes [provider]  PROAIR HFA 108 (90 BASE) MCG/ACT inhaler Inhale 2 puffs into the lungs every 4 (four) hours as needed for  wheezing (wheezing).  05/28/12  Yes [provider]  RESTASIS 0.05 % ophthalmic emulsion Place 1 drop into both eyes 2 (two) times daily. 11/19/17  Yes [provider]    Physical Exam:  Constitutional: NAD, calm, looks a bit uncomfortable, not using accessory muscles Vitals:   02/21/19 1430 02/21/19 1445 02/21/19 1500 02/21/19 1515  BP: (!) 109/92 99/78 (!) 105/49   Pulse:      Resp: 18 20 17  (!) 22  Temp:      TempSrc:      SpO2:       Eyes: PERRL, lids and conjunctivae normal ENMT: Mucous membranes are dry. Posterior pharynx clear of any exudate or lesions.Normal dentition.  Neck: normal, supple, no masses, no thyromegaly Respiratory: clear to auscultation bilaterally, no wheezing, no crackles. Normal respiratory effort. No accessory  muscle use.  Cardiovascular: Irregularly irregular rate and rhythm, no murmurs / rubs / gallops. No extremity edema. 2+ pedal pulses. No carotid bruits.  Abdomen: Right upper quadrant and left lower quadrant tenderness, no masses palpated. No hepatosplenomegaly. Bowel sounds positive.  Soft.  Obese  musculoskeletal: no clubbing / cyanosis. No joint deformity upper and lower extremities. Good ROM, no contractures. Normal muscle tone.  Skin: no rashes, lesions, ulcers. No induration.  Poor skin turgor Neurologic: CN 2-12 grossly intact. Sensation intact, DTR normal. Strength 5/5 in all 4.  Psychiatric: Normal judgment and insight. Alert and oriented x 3. Normal mood.    Labs on Admission: I have personally reviewed following labs and imaging studies  CBC: Recent Labs  Lab 02/21/19 1116  WBC 9.0  NEUTROABS 5.9  HGB 16.7*  HCT 49.2*  MCV 87.1  PLT 468   Basic Metabolic Panel: Recent Labs  Lab 02/21/19 1116  NA 126*  K 3.2*  CL 79*  CO2 31  GLUCOSE 676*  BUN 30*  CREATININE 2.14*  CALCIUM 8.9  MG 1.8   Liver Function Tests: Recent Labs  Lab 02/21/19 1116  AST 28  ALT 17  ALKPHOS 63  BILITOT 1.9*  PROT 9.0*    ALBUMIN 3.5   Recent Labs  Lab 02/21/19 1116  LIPASE 26   CBG: Recent Labs  Lab 02/21/19 1115 02/21/19 1227  GLUCAP >600* 575*   Urine analysis:    Component Value Date/Time   COLORURINE YELLOW 06/30/2018 1932   APPEARANCEUR CLEAR 06/30/2018 1932   LABSPEC 1.010 06/30/2018 1932   PHURINE 5.0 06/30/2018 1932   GLUCOSEU NEGATIVE 06/30/2018 1932   HGBUR NEGATIVE 06/30/2018 [place] NEGATIVE 06/30/2018 [place] 06/30/2018 1932   PROTEINUR NEGATIVE 06/30/2018 1932   UROBILINOGEN 1.0 10/26/2014 0249   NITRITE NEGATIVE 06/30/2018 1932   LEUKOCYTESUR NEGATIVE 06/30/2018 1932    Radiological Exams on Admission: Ct Abdomen Pelvis W Contrast  Result Date: 02/21/2019 CLINICAL DATA:  Left lower quadrant pain. EXAM: CT ABDOMEN AND PELVIS WITH CONTRAST TECHNIQUE: Multidetector CT imaging of the abdomen and pelvis was performed using the standard protocol following bolus administration of intravenous contrast. CONTRAST:  164mL OMNIPAQUE IOHEXOL 300 MG/ML  SOLN COMPARISON:  CT abdomen pelvis dated June 30, 2018. FINDINGS: Lower chest: No acute abnormality. Hepatobiliary: No focal liver abnormality is seen. Status post cholecystectomy. No biliary dilatation. Pancreas: Unremarkable. No pancreatic ductal dilatation or surrounding inflammatory changes. Spleen: Normal in size without focal abnormality. Adrenals/Urinary Tract: The adrenal glands are unremarkable. No renal mass, calculi, or hydronephrosis. The bladder is unremarkable for the degree of distention. Stomach/Bowel: Stomach is within normal limits. Appendix appears normal. No evidence of bowel wall thickening, distention, or inflammatory changes. 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 No enlarged abdominal or pelvic lymph nodes. Reproductive: Status post hysterectomy. Unchanged 3.6 cm complex cyst in the left ovary, stable since 2011, consistent  with benign etiology. The right ovary is unremarkable. Other: No abdominal wall hernia or abnormality. No abdominopelvic ascites. No pneumoperitoneum. Musculoskeletal: No acute or significant osseous findings. IMPRESSION: 1.  No acute intra-abdominal process. 2. 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 3. Unchanged 3.6 cm complex cyst in the left ovary, stable since 2011. Electronically Signed   By: Titus Dubin M.D.   On: 02/21/2019 12:44   Dg Chest Port 1 View  Result Date: 02/21/2019 CLINICAL DATA:  Crushing chest pain EXAM: PORTABLE CHEST 1 VIEW COMPARISON:  September 02, 2018 FINDINGS: The mediastinal contour is normal. The heart size is enlarged. There is no focal infiltrate, pulmonary edema, or pleural effusion. The visualized skeletal structures are stable. IMPRESSION: No active cardiopulmonary disease. Electronically Signed   By: Abelardo Diesel M.D.   On: 02/21/2019 13:20    EKG: Independently reviewed.  Atrial fibrillation 127 bpm to September 02, 2018 rate is increased from 73 bpm.  Assessment/Plan Principal Problem:   HHNC (hyperglycemic hyperosmolar nonketotic coma) ([place]) Active Problems:   Chest pain   Nausea vomiting and diarrhea   Congestive heart failure with left ventricular diastolic dysfunction, acute (HCC)   Hypokalemia   Acute kidney injury superimposed on CKD (HCC)   Diabetes mellitus (HCC)   Atrial fibrillation (HCC)   OSA (obstructive sleep apnea)   Morbid obesity (HCC)   Gastroparesis   1.  HH [place] with hypo-kalemia and acute kidney injury superimposed on chronic kidney disease: Patient will be admitted into the hospital placed in the progressive unit on an insulin drip.  Will monitor blood glucoses closely.  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  She has already received 2 500 mL fluid boluses.  She states her blood glucoses have been running high at home for 5 or 6 days.   Her hyponatremia is expected.  Hypokalemia and acute kidney injury are also expected with this problem.  Her potassium will be repleted IV and p.o., she will also receive IV fluids to help with acute kidney injury.  Baseline creatinine is 1.57-1.99  2.  Chest pain: Likely related to nausea and vomiting we will give patient a GI cocktail and monitor cardiac enzymes.  Will repeat EKG in a.m.  Also give GI cocktail.  3.  Nausea vomiting and diarrhea: Could be related to a viral gastroenteritis.  Will check a GI viral panel.  Patient will be hydrated and given antiemetics as well.  4.  Congestive heart failure with left ventricular diastolic dysfunction: Currently stable will monitor patient she is somewhat dehydrated will need to watch her fluid intake.  To new home medications.  5.  Hypokalemia: Replete IV and p.o.  6.  Diabetes mellitus type 2 out of control: As above.  7.  Atrial fibrillation: Continue Pradaxa and if unable to tolerate p.o. we will give IV heparin.  Resume metoprolol to obtain rate control.  Patient currently on diltiazem drip.  8.  Ructive sleep apnea: Continue with CPAP as at home.  9.  Super morbid obesity: Noted.  10.  Gastroparesis related to diabetes mellitus noted.  DVT prophylaxis: Pradaxa or heparin depending on p.o. status Code Status: Full code Family Communication: Patient retains capacity no family communicated with. Disposition Plan: Likely home in 2 to 3 days Consults called: None Admission status: ED spoke with Dr. Terrence Dupont patient's cardiologist who recommended admission and to call him if necessary   Admit - It is my clinical opinion that admission to INPATIENT is reasonable and necessary because of the expectation that this patient will require hospital care that crosses at least 2 midnights to treat this condition based on the medical complexity of the problems presented.  Given the aforementioned information, the predictability of an adverse outcome is  felt to be significant.   Lady Deutscher MD FACP [facility] Pager [phone]  How to contact the [facility] Attending or Consulting provider [place] or  covering provider during after hours [place], for this patient?  1. Check the care team in [facility] and look for a) attending/consulting [facility] provider listed and b) the [facility] team listed 2. Log into [email or web address] and use [place]'s universal password to access. If you do not have the password, please contact the hospital operator. 3. Locate the [facility] provider you are looking for under [facility] and page to a number that you can be directly reached. 4. If you still have difficulty reaching the provider, please page the [facility] (Director on Call) for the Hospitalists listed on amion for assistance.  If 7PM-7AM, please contact night-coverage [email or web address] Password [email or web address]  02/21/2019, 3:20 PM

## 2019-02-21 NOTE — Plan of Care (Signed)
Patient is progressing with care plan. 

## 2019-02-21 NOTE — Progress Notes (Signed)
Peripherally Inserted Central Catheter/Midline Placement  The IV Nurse has discussed with the patient and/or persons authorized to consent for the patient, the purpose of this procedure and the potential benefits and risks involved with this procedure.  The benefits include less needle sticks, lab draws from the catheter, and the patient may be discharged home with the catheter. Risks include, but not limited to, infection, bleeding, blood clot (thrombus formation), and puncture of an artery; nerve damage and irregular heartbeat and possibility to perform a PICC exchange if needed/ordered by physician.  Alternatives to this procedure were also discussed.  [facility] PICC patient education guide, fact sheet on infection prevention and patient information card has been provided to patient /or left at bedside.    PICC/Midline Placement Documentation  PICC Triple Lumen 02/21/19 PICC Right Cephalic 38 cm 0 cm (Active)  Indication for Insertion or Continuance of Line Vasoactive infusions 02/21/19 2101  Exposed Catheter (cm) 0 cm 02/21/19 2101  Site Assessment Clean;Dry;Intact 02/21/19 2101  Lumen #1 Status Flushed;Saline locked;Blood return noted 02/21/19 2101  Lumen #2 Status Flushed;Saline locked;Blood return noted 02/21/19 2101  Lumen #3 Status Flushed;Saline locked;Blood return noted 02/21/19 2101  Dressing Type Transparent 02/21/19 2101  Dressing Status Clean;Dry;Intact;Antimicrobial disc in place 02/21/19 2101  Dressing Intervention New dressing 02/21/19 2101  Dressing Change Due 02/28/19 02/21/19 2101       Gordan Payment 02/21/2019, 9:02 PM

## 2019-02-21 NOTE — ED Notes (Signed)
ED TO INPATIENT HANDOFF REPORT  ED Nurse Name and Phone #: Harriette Bouillon [phone]  S Name/Age/Gender [name] 63 y.o. female Room/Bed: 044C/044C  Code Status   Code Status: Prior  Home/SNF/Other Home Patient oriented to: self, place, time and situation Is this baseline? Yes   Triage Complete: Triage complete  Chief Complaint CP  Triage Note Pt stated she has been having "crushing" CP as well as n/v & diarrhea since Sunday. She does endorse having a Hx with acid reflux & denies any radiation of the CP.   Allergies Allergies  Allergen Reactions  . Sulfa Antibiotics Itching    Level of Care/Admitting Diagnosis ED Disposition    ED Disposition Condition Comment   Admit  Hospital Area: [facility] [[id number]]  Level of Care: Progressive [102]  Covid Evaluation: Asymptomatic Screening Protocol (No Symptoms)  Diagnosis: HHNC (hyperglycemic hyperosmolar nonketotic coma) (HCC) [340546]  Admitting Physician: SHEEHAN, THERESA C [[id number]]  Attending Physician: SHEEHAN, THERESA C [[id number]]  Estimated length of stay: 3 - 4 days  Certification:: I certify this patient will need inpatient services for at least 2 midnights  PT Class (Do Not Modify): Inpatient [101]  PT Acc Code (Do Not Modify): Private [1]       B Medical/Surgery History Past Medical History:  Diagnosis Date  . Allergic rhinitis   . Arthritis   . Asthma   . Chest pain 12/27/2017  . Chronic diastolic CHF (congestive heart failure) (HCC)   . COPD (chronic obstructive pulmonary disease) (HCC)   . Depression   . DM (diabetes mellitus) (HCC)   . DVT (deep vein thrombosis) in pregnancy   . Dysrhythmia    atrial fibrilation  . GERD (gastroesophageal reflux disease)   . Headache(784.0)   . HTN (hypertension)   . Hyperlipidemia   . Obesity   . Pneumonia 04/2018   RIGHT LOBE  . Shortness of breath   . Sleep apnea    compliant with CPAP   Past Surgical History:  Procedure Laterality  Date  . ABDOMINAL HYSTERECTOMY    . CATARACT EXTRACTION, BILATERAL    . CHOLECYSTECTOMY    . COLONOSCOPY WITH PROPOFOL N/A 03/05/2015   Procedure: COLONOSCOPY WITH PROPOFOL;  Surgeon: Patrick Hung, MD;  Location: [facility];  Service: Endoscopy;  Laterality: N/A;  . DIAGNOSTIC LAPAROSCOPY    . ingrown hallux Left   . KNEE SURGERY    . LEFT HEART CATH AND CORONARY ANGIOGRAPHY N/A 12/31/2017   Procedure: LEFT HEART CATH AND CORONARY ANGIOGRAPHY;  Surgeon: Harwani, Mohan, MD;  Location: [facility]VASIVE [facility];  Service: Cardiovascular;  Laterality: N/A;     A IV Location/Drains/Wounds Patient Lines/Drains/Airways Status   Active Line/Drains/Airways    Name:   Placement date:   Placement time:   Site:   Days:   Peripheral IV 02/21/19 Right Antecubital   02/21/19    1130    Antecubital   less than 1   AIRWAYS   03/05/15    1331     1449   AIRWAYS   03/05/15    1303     14 49          Intake/Output Last 24 hours  Intake/Output Summary (Last 24 hours) at 02/21/2019 1433 Last data filed at 02/21/2019 1300 Gross per 24 hour  Intake 500 ml  Output -  Net 500 ml    Labs/Imaging Results for orders placed or performed during the hospital encounter of 02/21/19 (from the past 48 hour(s))  CBG monitoring,  ED     Status: Abnormal   Collection Time: 02/21/19 11:15 AM  Result Value Ref Range   Glucose-Capillary >600 (HH) 70 - 99 mg/dL  CBC with Differential/Platelet     Status: Abnormal   Collection Time: 02/21/19 11:16 AM  Result Value Ref Range   WBC 9.0 4.0 - 10.5 K/uL   RBC 5.65 (H) 3.87 - 5.11 MIL/uL   Hemoglobin 16.7 (H) 12.0 - 15.0 g/dL   HCT 49.2 (H) 36.0 - 46.0 %   MCV 87.1 80.0 - 100.0 fL   MCH 29.6 26.0 - 34.0 pg   MCHC 33.9 30.0 - 36.0 g/dL   RDW 12.7 11.5 - 15.5 %   Platelets 181 150 - 400 K/uL   nRBC 0.0 0.0 - 0.2 %   Neutrophils Relative % 67 %   Neutro Abs 5.9 1.7 - 7.7 K/uL   Lymphocytes Relative 21 %   Lymphs Abs 1.9 0.7 - 4.0 K/uL   Monocytes Relative 11 %    Monocytes Absolute 1.0 0.1 - 1.0 K/uL   Eosinophils Relative 1 %   Eosinophils Absolute 0.1 0.0 - 0.5 K/uL   Basophils Relative 0 %   Basophils Absolute 0.0 0.0 - 0.1 K/uL   Immature Granulocytes 0 %   Abs Immature Granulocytes 0.04 0.00 - 0.07 K/uL    Comment: Performed at [facility], [address] [place], [place] [address]  Comprehensive metabolic panel     Status: Abnormal   Collection Time: 02/21/19 11:16 AM  Result Value Ref Range   Sodium 126 (L) 135 - 145 mmol/L   Potassium 3.2 (L) 3.5 - 5.1 mmol/L   Chloride 79 (L) 98 - 111 mmol/L   CO2 31 22 - 32 mmol/L   Glucose, Bld 676 (HH) 70 - 99 mg/dL    Comment: CRITICAL RESULT CALLED TO, READ BACK BY AND VERIFIED WITH: Kyllie Pettijohn,L RN @ 1222 02/21/19 LEONARD,A    BUN 30 (H) 8 - 23 mg/dL   Creatinine, Ser 2.14 (H) 0.44 - 1.00 mg/dL   Calcium 8.9 8.9 - 10.3 mg/dL   Total Protein 9.0 (H) 6.5 - 8.1 g/dL   Albumin 3.5 3.5 - 5.0 g/dL   AST 28 15 - 41 U/L   ALT 17 0 - 44 U/L   Alkaline Phosphatase 63 38 - 126 U/L   Total Bilirubin 1.9 (H) 0.3 - 1.2 mg/dL   GFR calc non Af Amer 24 (L) >60 mL/min   GFR calc Af Amer 28 (L) >60 mL/min   Anion gap 16 (H) 5 - 15    Comment: Performed at [facility], [place] [address] [place], [place] [address]  Lipase, blood     Status: None   Collection Time: 02/21/19 11:16 AM  Result Value Ref Range   Lipase 26 11 - 51 U/L    Comment: Performed at [facility] [address], [place], [place] [address]  Brain natriuretic peptide     Status: None   Collection Time: 02/21/19 11:16 AM  Result Value Ref Range   B Natriuretic Peptide 29.1 0.0 - 100.0 pg/mL    Comment: Performed at [facility] [address], [place], [place] [address]  Magnesium     Status: None   Collection Time: 02/21/19 11:16 AM  Result Value Ref Range   Magnesium 1.8 1.7 - 2.4 mg/dL    Comment: Performed at [facility] [address] [place], [place] [address]  Troponin  I (High Sensitivity)      Status: None   Collection Time: 02/21/19 11:16 AM  Result Value Ref Range   Troponin I (High Sensitivity) 11 <18 ng/L    Comment: (NOTE) Elevated high sensitivity troponin I (hsTnI) values and significant  changes across serial measurements may suggest ACS but many other  chronic and acute conditions are known to elevate hsTnI results.  Refer to the Links section for chest pain algorithms and additional  guidance. Performed at [facility], [place] [address] [place], [place] [address]   CBG monitoring, ED     Status: Abnormal   Collection Time: 02/21/19 12:27 PM  Result Value Ref Range   Glucose-Capillary 575 (HH) 70 - 99 mg/dL   Comment 1 Notify RN   SARS Coronavirus 2 (CEPHEID- Performed in [place] hospital lab), Hosp Order     Status: None   Collection Time: 02/21/19 12:30 PM   Specimen: Nasopharyngeal Swab  Result Value Ref Range   SARS Coronavirus 2 NEGATIVE NEGATIVE    Comment: (NOTE) If result is NEGATIVE SARS-CoV-2 target nucleic acids are NOT DETECTED. The SARS-CoV-2 RNA is generally detectable in upper and lower  respiratory specimens during the acute phase of infection. The lowest  concentration of SARS-CoV-2 viral copies this assay can detect is 250  copies / mL. A negative result does not preclude SARS-CoV-2 infection  and should not be used as the sole basis for treatment or other  patient management decisions.  A negative result may occur with  improper specimen collection / handling, submission of specimen other  than nasopharyngeal swab, presence of viral mutation(s) within the  areas targeted by this assay, and inadequate number of viral copies  (<250 copies / mL). A negative result must be combined with clinical  observations, patient history, and epidemiological information. If result is POSITIVE SARS-CoV-2 target nucleic acids are DETECTED. The SARS-CoV-2 RNA is generally detectable in upper and lower  respiratory specimens dur ing the acute phase  of infection.  Positive  results are indicative of active infection with SARS-CoV-2.  Clinical  correlation with patient history and other diagnostic information is  necessary to determine patient infection status.  Positive results do  not rule out bacterial infection or co-infection with other viruses. If result is PRESUMPTIVE POSTIVE SARS-CoV-2 nucleic acids MAY BE PRESENT.   A presumptive positive result was obtained on the submitted specimen  and confirmed on repeat testing.  While 2019 novel coronavirus  (SARS-CoV-2) nucleic acids may be present in the submitted sample  additional confirmatory testing may be necessary for epidemiological  and / or clinical management purposes  to differentiate between  SARS-CoV-2 and other Sarbecovirus currently known to infect humans.  If clinically indicated additional testing with an alternate test  methodology [phone]) is advised. The SARS-CoV-2 RNA is generally  detectable in upper and lower respiratory sp ecimens during the acute  phase of infection. The expected result is Negative. Fact Sheet for Patients:  StrictlyIdeas.no Fact Sheet for Healthcare Providers: BankingDealers.co.za This test is not yet approved or cleared by the [place] FDA and has been authorized for detection and/or diagnosis of SARS-CoV-2 by FDA under an Emergency Use Authorization (EUA).  This EUA will remain in effect (meaning this test can be used) for the duration of the COVID-19 declaration under Section 564(b)(1) of the Act, 21 U.S.C. section 360bbb-3(b)(1), unless the authorization is terminated or revoked sooner. Performed at [facility], [place] [address] [place], [place] [address]  Ct Abdomen Pelvis W Contrast  Result Date: 02/21/2019 CLINICAL DATA:  Left lower quadrant pain. EXAM: CT ABDOMEN AND PELVIS WITH CONTRAST TECHNIQUE: Multidetector CT imaging of the abdomen and pelvis was performed using  the standard protocol following bolus administration of intravenous contrast. CONTRAST:  140mL OMNIPAQUE IOHEXOL 300 MG/ML  SOLN COMPARISON:  CT abdomen pelvis dated June 30, 2018. FINDINGS: Lower chest: No acute abnormality. Hepatobiliary: No focal liver abnormality is seen. Status post cholecystectomy. No biliary dilatation. Pancreas: Unremarkable. No pancreatic ductal dilatation or surrounding inflammatory changes. Spleen: Normal in size without focal abnormality. Adrenals/Urinary Tract: The adrenal glands are unremarkable. No renal mass, calculi, or hydronephrosis. The bladder is unremarkable for the degree of distention. Stomach/Bowel: Stomach is within normal limits. Appendix appears normal. No evidence of bowel wall thickening, distention, or inflammatory changes. 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 No enlarged abdominal or pelvic lymph nodes. Reproductive: Status post hysterectomy. Unchanged 3.6 cm complex cyst in the left ovary, stable since 2011, consistent with benign etiology. The right ovary is unremarkable. Other: No abdominal wall hernia or abnormality. No abdominopelvic ascites. No pneumoperitoneum. Musculoskeletal: No acute or significant osseous findings. IMPRESSION: 1.  No acute intra-abdominal process. 2. 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 3. Unchanged 3.6 cm complex cyst in the left ovary, stable since 2011. Electronically Signed   By: Titus Dubin M.D.   On: 02/21/2019 12:44   Dg Chest Port 1 View  Result Date: 02/21/2019 CLINICAL DATA:  Crushing chest pain EXAM: PORTABLE CHEST 1 VIEW COMPARISON:  September 02, 2018 FINDINGS: The mediastinal contour is normal. The heart size is enlarged. There is no focal infiltrate, pulmonary edema, or pleural effusion. The visualized skeletal structures are stable. IMPRESSION: No active cardiopulmonary disease. Electronically  Signed   By: Abelardo Diesel M.D.   On: 02/21/2019 13:20    Pending Labs Unresulted Labs (From admission, onward)    Start     Ordered   02/21/19 1119  Urinalysis, Routine w reflex microscopic  Once,   STAT     02/21/19 1118   02/21/19 1116  Troponin I (High Sensitivity)  STAT Now then every 2 hours,   R (with STAT occurrences)    Question:  Indication  Answer:  Suspect ACS   02/21/19 1118          Vitals/Pain Today's Vitals   02/21/19 1230 02/21/19 1345 02/21/19 1400 02/21/19 1415  BP: (!) 113/91 112/88 96/60 108/77  Pulse: 70     Resp: 20 16 16 12   Temp:      TempSrc:      SpO2: 98%     PainSc:        Isolation Precautions Airborne and Contact precautions  Medications Medications  diltiazem (CARDIZEM) 100 mg in dextrose 5% 13mL (1 mg/mL) infusion (5 mg/hr Intravenous New Bag/Given 02/21/19 1327)  potassium chloride 10 mEq in 100 mL IVPB (has no administration in time range)  insulin aspart (novoLOG) injection 10 Units (10 Units Intravenous Given 02/21/19 1141)  sodium chloride 0.9 % bolus 500 mL (0 mLs Intravenous Stopped 02/21/19 1300)  diltiazem (CARDIZEM) injection 5 mg (5 mg Intravenous Given 02/21/19 1141)  iohexol (OMNIPAQUE) 300 MG/ML solution 100 mL (100 mLs Intravenous Contrast Given 02/21/19 1203)  sodium chloride 0.9 % bolus 500 mL (500 mLs Intravenous New Bag/Given 02/21/19 1324)    Mobility walks Low fall risk   Focused Assessments Cardiac  Assessment Handoff:  Cardiac Rhythm: Atrial fibrillation Lab Results  Component Value Date   CKTOTAL 118 02/27/2012   CKMB 1.1 02/27/2012   TROPONINI <0.03 07/01/2018   Lab Results  Component Value Date   DDIMER 0.27 12/27/2017   Does the Patient currently have chest pain? No     R Recommendations: See Admitting Provider Note  Report given to:   Additional Notes:

## 2019-02-21 NOTE — Plan of Care (Signed)
Patient's progress is progressing with care plan.

## 2019-02-22 DIAGNOSIS — E1101 Type 2 diabetes mellitus with hyperosmolarity with coma: Principal | ICD-10-CM

## 2019-02-22 LAB — BASIC METABOLIC PANEL
Anion gap: 12 (ref 5–15)
BUN: 27 mg/dL — ABNORMAL HIGH (ref 8–23)
CO2: 30 mmol/L (ref 22–32)
Calcium: 8.4 mg/dL — ABNORMAL LOW (ref 8.9–10.3)
Chloride: 87 mmol/L — ABNORMAL LOW (ref 98–111)
Creatinine, Ser: 1.67 mg/dL — ABNORMAL HIGH (ref 0.44–1.00)
GFR calc Af Amer: 38 mL/min — ABNORMAL LOW (ref >60)
GFR calc non Af Amer: 32 mL/min — ABNORMAL LOW (ref >60)
Glucose, Bld: 185 mg/dL — ABNORMAL HIGH (ref 70–99)
Potassium: 2.8 mmol/L — ABNORMAL LOW (ref 3.5–5.1)
Sodium: 129 mmol/L — ABNORMAL LOW (ref 135–145)

## 2019-02-22 LAB — GLUCOSE, CAPILLARY
Glucose-Capillary: 107 mg/dL — ABNORMAL HIGH (ref 70–99)
Glucose-Capillary: 121 mg/dL — ABNORMAL HIGH (ref 70–99)
Glucose-Capillary: 148 mg/dL — ABNORMAL HIGH (ref 70–99)
Glucose-Capillary: 155 mg/dL — ABNORMAL HIGH (ref 70–99)
Glucose-Capillary: 191 mg/dL — ABNORMAL HIGH (ref 70–99)
Glucose-Capillary: 211 mg/dL — ABNORMAL HIGH (ref 70–99)
Glucose-Capillary: 212 mg/dL — ABNORMAL HIGH (ref 70–99)
Glucose-Capillary: 221 mg/dL — ABNORMAL HIGH (ref 70–99)
Glucose-Capillary: 250 mg/dL — ABNORMAL HIGH (ref 70–99)
Glucose-Capillary: 270 mg/dL — ABNORMAL HIGH (ref 70–99)
Glucose-Capillary: 282 mg/dL — ABNORMAL HIGH (ref 70–99)
Glucose-Capillary: 286 mg/dL — ABNORMAL HIGH (ref 70–99)
Glucose-Capillary: 360 mg/dL — ABNORMAL HIGH (ref 70–99)

## 2019-02-22 LAB — URINALYSIS, ROUTINE W REFLEX MICROSCOPIC
Bacteria, UA: NONE SEEN
Bilirubin Urine: NEGATIVE
Glucose, UA: >=500 mg/dL — AB
Hgb urine dipstick: NEGATIVE
Ketones, ur: NEGATIVE mg/dL
Leukocytes,Ua: NEGATIVE
Nitrite: NEGATIVE
Protein, ur: NEGATIVE mg/dL
Specific Gravity, Urine: 1.031 — ABNORMAL HIGH (ref 1.005–1.030)
pH: 6 (ref 5.0–8.0)

## 2019-02-22 LAB — HEMOGLOBIN A1C
Hgb A1c MFr Bld: 13.8 % — ABNORMAL HIGH (ref 4.8–5.6)
Mean Plasma Glucose: 349.36 mg/dL

## 2019-02-22 MED ORDER — POTASSIUM CHLORIDE 10 MEQ/100ML IV SOLN
10.0000 meq | INTRAVENOUS | Status: AC
  Administered 2019-02-22 (×6): 10 meq via INTRAVENOUS
  Filled 2019-02-22 (×2): qty 100

## 2019-02-22 MED ORDER — INSULIN ASPART 100 UNIT/ML ~~LOC~~ SOLN
0.0000 [IU] | Freq: Three times a day (TID) | SUBCUTANEOUS | Status: DC
  Administered 2019-02-22: 11 [IU] via SUBCUTANEOUS
  Administered 2019-02-22: 20 [IU] via SUBCUTANEOUS
  Administered 2019-02-23: 15 [IU] via SUBCUTANEOUS
  Administered 2019-02-23: 11 [IU] via SUBCUTANEOUS
  Administered 2019-02-23: 20 [IU] via SUBCUTANEOUS
  Administered 2019-02-24: 7 [IU] via SUBCUTANEOUS
  Administered 2019-02-24: 15 [IU] via SUBCUTANEOUS

## 2019-02-22 MED ORDER — INSULIN ASPART 100 UNIT/ML ~~LOC~~ SOLN
0.0000 [IU] | Freq: Every day | SUBCUTANEOUS | Status: DC
  Administered 2019-02-22 – 2019-02-23 (×2): 3 [IU] via SUBCUTANEOUS

## 2019-02-22 MED ORDER — INSULIN GLARGINE 100 UNIT/ML ~~LOC~~ SOLN
20.0000 [IU] | Freq: Every day | SUBCUTANEOUS | Status: DC
  Administered 2019-02-22 – 2019-02-23 (×2): 20 [IU] via SUBCUTANEOUS
  Filled 2019-02-22 (×2): qty 0.2

## 2019-02-22 MED ORDER — POTASSIUM CHLORIDE CRYS ER 20 MEQ PO TBCR
40.0000 meq | EXTENDED_RELEASE_TABLET | Freq: Once | ORAL | Status: AC
  Administered 2019-02-22: 40 meq via ORAL
  Filled 2019-02-22: qty 2

## 2019-02-22 NOTE — Progress Notes (Signed)
Pt remains on clear diet. Nausea X 3 episodes on day shift. Diarrhea X 2 episodes. GI panel pending.

## 2019-02-22 NOTE — Progress Notes (Signed)
PROGRESS NOTE    [name]  FHL:[id number] DOB: Jan 29, 1956 DOA: 02/21/2019 PCP: Charolette Forward, MD   Brief Narrative:  Patient is a 63 year old female with history of chronic diastolic congestive heart failure, COPD, morbid obesity, sleep apnea, hypertension, hyperlipidemia, GERD, diabetes type 2, A. fib on Pradaxa who presents with a complaints of nausea vomiting and diarrhea since a week.  On presentation, her sugars were found to be elevated along with elevated anion gap.  Found to have acute kidney injury, hyperkalemia and she was in A. fib with RVR and HHNS.  Patient was started on insulin drip. Insulin drip has been discontinued this morning.  Started on long-acting and sliding scale insulin.  This morning she was still nauseous.  Assessment & Plan:   Principal Problem:   HHNC (hyperglycemic hyperosmolar nonketotic coma) ([place]) Active Problems:   Diabetes mellitus ([place])   Morbid obesity (HCC)   Atrial fibrillation (HCC)   Gastroparesis   OSA (obstructive sleep apnea)   Chest pain   Nausea vomiting and diarrhea   Congestive heart failure with left ventricular diastolic dysfunction, acute (HCC)   Hypokalemia   Acute kidney injury superimposed on CKD ([place])   Hyper osmolar hyperglycemic nonketotic syndrome/diabetes type 2: Presented with severe hyperglycemia, nausea, vomiting.  Mildly elevated anion gap but no ketones in the urine.  Initially started on insulin drip. Drip has been stopped.  Started on Lantus and sliding scale insulin.  This morning she is still nauseous.  Started on clear liquid diet.  Continue to monitor CBGs.  Continue IV fluids. Hemoglobin A1c in the range of 13.  Diabetic coordinator consulted.  She is on insulin at home.  Persistent nausea/vomiting: Most likely secondary to diabetic gastroparesis.  Reglan not started due to prolonged QTc interval.  Continue Zofran, gentle IV fluids.  AKI on CKD stage III: Baseline creatinine ranges from 1.5-1.9.   Presented with creatinine in the range of 2.  Kidney function improved with IV fluids.  A. fib with RVR: Currently rate is controlled.  She is on Pradaxa for anticoagulation.  Continue metoprolol.  Chest pain: Resolved.  Nonspecific changes in the EKG.  History of congestive heart failure with preserved ejection fraction: Currently dehydrated.  Continue gentle IV fluids.  Will be cautious with IV fluids.  On Lasix at home.  Follows with Dr. Terrence Dupont, cardiology  Hypokalemia: Being supplemented  Obstructive sleep apnea: On CPAP at home.  Will continue CPAP here.  Morbid obesity: BMI of 43.23         DVT prophylaxis: Pradaxa Code Status: Full Family Communication: None present at the bedside Disposition Plan: Likely home tomorrow   Consultants: None  Procedures: None Antimicrobials:  Anti-infectives (From admission, onward)   None      Subjective:  Patient seen and examined the bedside this morning.  Still nauseous, complains of abdominal pain.  Hemodynamically stable.  CBC this morning has been under control.  Started on long-acting and slidings scale insulin  Objective: Vitals:   02/21/19 2100 02/22/19 0000 02/22/19 0301 02/22/19 0809  BP:  98/65 105/64 120/74  Pulse:  93  93  Resp:  (!) 25  18  Temp:  97.6 F (36.4 C) 97.6 F (36.4 C) 97.8 F (36.6 C)  TempSrc:  Oral Oral Oral  SpO2:  98%  93%  Weight: 125.2 kg     Height: 5\' 7"  (1.702 m)       Intake/Output Summary (Last 24 hours) at 02/22/2019 1008 Last data filed at 02/22/2019 3734 Gross  per 24 hour  Intake 3399.89 ml  Output 575 ml  Net 2824.89 ml   Filed Weights   02/21/19 1700 02/21/19 2100  Weight: 125.2 kg 125.2 kg    Examination:  General exam:,Not in distress, morbid obesity HEENT:PERRL,Oral mucosa moist, Ear/Nose normal on gross exam Respiratory system: Bilateral equal air entry, normal vesicular breath sounds, no wheezes or crackles  Cardiovascular system: Afib. No JVD, murmurs, rubs,  gallops or clicks. No pedal edema. Gastrointestinal system: Abdomen is nondistended, soft and nontender. No organomegaly or masses felt. Normal bowel sounds heard. Central nervous system: Alert and oriented. No focal neurological deficits. Extremities: No edema, no clubbing ,no cyanosis, distal peripheral pulses palpable. Skin: No rashes, lesions or ulcers,no icterus ,no pallor   Data Reviewed: I have personally reviewed following labs and imaging studies  CBC: Recent Labs  Lab 02/21/19 1116  WBC 9.0  NEUTROABS 5.9  HGB 16.7*  HCT 49.2*  MCV 87.1  PLT 572   Basic Metabolic Panel: Recent Labs  Lab 02/21/19 1116 02/22/19 0844  NA 126* 129*  K 3.2* 2.8*  CL 79* 87*  CO2 31 30  GLUCOSE 676* 185*  BUN 30* 27*  CREATININE 2.14* 1.67*  CALCIUM 8.9 8.4*  MG 1.8  --    GFR: Estimated Creatinine Clearance: 48 mL/min (A) (by C-G formula based on SCr of 1.67 mg/dL (H)). Liver Function Tests: Recent Labs  Lab 02/21/19 1116  AST 28  ALT 17  ALKPHOS 63  BILITOT 1.9*  PROT 9.0*  ALBUMIN 3.5   Recent Labs  Lab 02/21/19 1116  LIPASE 26   No results for input(s): AMMONIA in the last 168 hours. Coagulation Profile: No results for input(s): INR, PROTIME in the last 168 hours. Cardiac Enzymes: No results for input(s): CKTOTAL, CKMB, CKMBINDEX, TROPONINI in the last 168 hours. BNP (last 3 results) No results for input(s): PROBNP in the last 8760 hours. HbA1C: Recent Labs    02/22/19 0844  HGBA1C 13.8*   CBG: Recent Labs  Lab 02/22/19 0505 02/22/19 0556 02/22/19 0653 02/22/19 0807 02/22/19 0904  GLUCAP 148* 121* 107* 155* 211*   Lipid Profile: No results for input(s): CHOL, HDL, LDLCALC, TRIG, CHOLHDL, LDLDIRECT in the last 72 hours. Thyroid Function Tests: No results for input(s): TSH, T4TOTAL, FREET4, T3FREE, THYROIDAB in the last 72 hours. Anemia Panel: No results for input(s): VITAMINB12, FOLATE, FERRITIN, TIBC, IRON, RETICCTPCT in the last 72  hours. Sepsis Labs: No results for input(s): PROCALCITON, LATICACIDVEN in the last 168 hours.  Recent Results (from the past 240 hour(s))  SARS Coronavirus 2 (CEPHEID- Performed in [place] hospital lab), Hosp Order     Status: None   Collection Time: 02/21/19 12:30 PM   Specimen: Nasopharyngeal Swab  Result Value Ref Range Status   SARS Coronavirus 2 NEGATIVE NEGATIVE Final    Comment: (NOTE) If result is NEGATIVE SARS-CoV-2 target nucleic acids are NOT DETECTED. The SARS-CoV-2 RNA is generally detectable in upper and lower  respiratory specimens during the acute phase of infection. The lowest  concentration of SARS-CoV-2 viral copies this assay can detect is 250  copies / mL. A negative result does not preclude SARS-CoV-2 infection  and should not be used as the sole basis for treatment or other  patient management decisions.  A negative result may occur with  improper specimen collection / handling, submission of specimen other  than nasopharyngeal swab, presence of viral mutation(s) within the  areas targeted by this assay, and inadequate number of viral copies  (<  250 copies / mL). A negative result must be combined with clinical  observations, patient history, and epidemiological information. If result is POSITIVE SARS-CoV-2 target nucleic acids are DETECTED. The SARS-CoV-2 RNA is generally detectable in upper and lower  respiratory specimens dur ing the acute phase of infection.  Positive  results are indicative of active infection with SARS-CoV-2.  Clinical  correlation with patient history and other diagnostic information is  necessary to determine patient infection status.  Positive results do  not rule out bacterial infection or co-infection with other viruses. If result is PRESUMPTIVE POSTIVE SARS-CoV-2 nucleic acids MAY BE PRESENT.   A presumptive positive result was obtained on the submitted specimen  and confirmed on repeat testing.  While 2019 novel coronavirus   (SARS-CoV-2) nucleic acids may be present in the submitted sample  additional confirmatory testing may be necessary for epidemiological  and / or clinical management purposes  to differentiate between  SARS-CoV-2 and other Sarbecovirus currently known to infect humans.  If clinically indicated additional testing with an alternate test  methodology [phone]) is advised. The SARS-CoV-2 RNA is generally  detectable in upper and lower respiratory sp ecimens during the acute  phase of infection. The expected result is Negative. Fact Sheet for Patients:  StrictlyIdeas.no Fact Sheet for Healthcare Providers: BankingDealers.co.za This test is not yet approved or cleared by the [place] FDA and has been authorized for detection and/or diagnosis of SARS-CoV-2 by FDA under an Emergency Use Authorization (EUA).  This EUA will remain in effect (meaning this test can be used) for the duration of the COVID-19 declaration under Section 564(b)(1) of the Act, 21 U.S.C. section 360bbb-3(b)(1), unless the authorization is terminated or revoked sooner. Performed at [facility], [place] [address] [place], [place] [address]   MRSA PCR Screening     Status: None   Collection Time: 02/21/19  4:23 PM   Specimen: Nasal Mucosa; Nasopharyngeal  Result Value Ref Range Status   MRSA by PCR NEGATIVE NEGATIVE Final    Comment:        The GeneXpert MRSA Assay (FDA approved for NASAL specimens only), is one component of a comprehensive MRSA colonization surveillance program. It is not intended to diagnose MRSA infection nor to guide or monitor treatment for MRSA infections. Performed at [facility], [place] [address], [place], [place] [address]          Radiology Studies: Ct Abdomen Pelvis W Contrast  Result Date: 02/21/2019 CLINICAL DATA:  Left lower quadrant pain. EXAM: CT ABDOMEN AND PELVIS WITH CONTRAST TECHNIQUE: Multidetector CT imaging  of the abdomen and pelvis was performed using the standard protocol following bolus administration of intravenous contrast. CONTRAST:  152mL OMNIPAQUE IOHEXOL 300 MG/ML  SOLN COMPARISON:  CT abdomen pelvis dated June 30, 2018. FINDINGS: Lower chest: No acute abnormality. Hepatobiliary: No focal liver abnormality is seen. Status post cholecystectomy. No biliary dilatation. Pancreas: Unremarkable. No pancreatic ductal dilatation or surrounding inflammatory changes. Spleen: Normal in size without focal abnormality. Adrenals/Urinary Tract: The adrenal glands are unremarkable. No renal mass, calculi, or hydronephrosis. The bladder is unremarkable for the degree of distention. Stomach/Bowel: Stomach is within normal limits. Appendix appears normal. No evidence of bowel wall thickening, distention, or inflammatory changes. 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 No enlarged abdominal or pelvic lymph nodes. Reproductive: Status post hysterectomy. Unchanged 3.6 cm complex cyst in the left ovary, stable since 2011, consistent with benign etiology. The  right ovary is unremarkable. Other: No abdominal wall hernia or abnormality. No abdominopelvic ascites. No pneumoperitoneum. Musculoskeletal: No acute or significant osseous findings. IMPRESSION: 1.  No acute intra-abdominal process. 2. 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 3. Unchanged 3.6 cm complex cyst in the left ovary, stable since 2011. Electronically Signed   By: Titus Dubin M.D.   On: 02/21/2019 12:44   Dg Chest Port 1 View  Result Date: 02/21/2019 CLINICAL DATA:  PICC line placement. EXAM: PORTABLE CHEST 1 VIEW COMPARISON:  September 02, 2018 FINDINGS: The right PICC line terminates in the central SVC. The cardiomediastinal silhouette is stable. No pneumothorax. The lungs remain clear. IMPRESSION: The distal tip of the right PICC line is in the  central SVC. No other abnormalities. Electronically Signed   By: Dorise Bullion III M.D   On: 02/21/2019 21:32   Dg Chest Port 1 View  Result Date: 02/21/2019 CLINICAL DATA:  Crushing chest pain EXAM: PORTABLE CHEST 1 VIEW COMPARISON:  September 02, 2018 FINDINGS: The mediastinal contour is normal. The heart size is enlarged. There is no focal infiltrate, pulmonary edema, or pleural effusion. The visualized skeletal structures are stable. IMPRESSION: No active cardiopulmonary disease. Electronically Signed   By: Abelardo Diesel M.D.   On: 02/21/2019 13:20   [place] Ekg Site Rite  Result Date: 02/21/2019 If Site Rite image not attached, placement could not be confirmed due to current cardiac rhythm.       Scheduled Meds:  alum & mag hydroxide-simeth  30 mL Oral Once   And   lidocaine  15 mL Oral Once   cycloSPORINE  1 drop Both Eyes BID   dabigatran  75 mg Oral BID   digoxin  125 mcg Oral Daily   DULoxetine  60 mg Oral Daily   gabapentin  300 mg Oral QID   insulin aspart  0-20 Units Subcutaneous TID WC   insulin aspart  0-5 Units Subcutaneous QHS   insulin glargine  20 Units Subcutaneous Daily   losartan  25 mg Oral Daily   metoprolol tartrate  100 mg Oral BID   multivitamin with minerals  1 tablet Oral Daily   pantoprazole  40 mg Oral BID   potassium chloride SA  20 mEq Oral Daily   potassium chloride  40 mEq Oral Once   sodium chloride flush  10-40 mL Intracatheter Q12H   Continuous Infusions:  sodium chloride 75 mL/hr at 02/22/19 0917   diltiazem (CARDIZEM) infusion 5 mg/hr (02/22/19 0914)   potassium chloride 10 mEq (02/22/19 0955)     LOS: 1 day    Time spent: 35 mins.More than 50% of that time was spent in counseling and/or coordination of care.      Shelly Coss, MD [facility] Pager [phone]  If 7PM-7AM, please contact night-coverage [email or web address] Password [email or web address] 02/22/2019, 10:08 AM

## 2019-02-22 NOTE — Plan of Care (Signed)
Patient is progressing to meet plan of care goals.

## 2019-02-23 LAB — GLUCOSE, CAPILLARY
Glucose-Capillary: 361 mg/dL — ABNORMAL HIGH (ref 70–99)
Glucose-Capillary: 327 mg/dL — ABNORMAL HIGH (ref 70–99)
Glucose-Capillary: 283 mg/dL — ABNORMAL HIGH (ref 70–99)
Glucose-Capillary: 268 mg/dL — ABNORMAL HIGH (ref 70–99)

## 2019-02-23 LAB — BASIC METABOLIC PANEL
Anion gap: 10 (ref 5–15)
BUN: 25 mg/dL — ABNORMAL HIGH (ref 8–23)
CO2: 27 mmol/L (ref 22–32)
Calcium: 8.4 mg/dL — ABNORMAL LOW (ref 8.9–10.3)
Chloride: 93 mmol/L — ABNORMAL LOW (ref 98–111)
Creatinine, Ser: 1.54 mg/dL — ABNORMAL HIGH (ref 0.44–1.00)
GFR calc Af Amer: 41 mL/min — ABNORMAL LOW (ref >60)
GFR calc non Af Amer: 36 mL/min — ABNORMAL LOW (ref >60)
Glucose, Bld: 322 mg/dL — ABNORMAL HIGH (ref 70–99)
Potassium: 3.1 mmol/L — ABNORMAL LOW (ref 3.5–5.1)
Sodium: 130 mmol/L — ABNORMAL LOW (ref 135–145)

## 2019-02-23 LAB — GASTROINTESTINAL PANEL BY PCR, STOOL (REPLACES STOOL CULTURE)

## 2019-02-23 LAB — MAGNESIUM: Magnesium: 1.8 mg/dL (ref 1.7–2.4)

## 2019-02-23 MED ORDER — INSULIN ASPART 100 UNIT/ML ~~LOC~~ SOLN
5.0000 [IU] | Freq: Three times a day (TID) | SUBCUTANEOUS | Status: DC
  Administered 2019-02-23 (×2): 5 [IU] via SUBCUTANEOUS

## 2019-02-23 MED ORDER — POTASSIUM CHLORIDE 10 MEQ/100ML IV SOLN
10.0000 meq | INTRAVENOUS | Status: AC
  Administered 2019-02-23 (×5): 10 meq via INTRAVENOUS
  Filled 2019-02-23: qty 100

## 2019-02-23 MED ORDER — INSULIN GLARGINE 100 UNIT/ML ~~LOC~~ SOLN
30.0000 [IU] | Freq: Every day | SUBCUTANEOUS | Status: DC
  Administered 2019-02-23: 10 [IU] via SUBCUTANEOUS
  Filled 2019-02-23 (×2): qty 0.3

## 2019-02-23 NOTE — Plan of Care (Signed)
Patient is progressing to reach care plan goals.

## 2019-02-23 NOTE — Progress Notes (Signed)
PROGRESS NOTE    [name]  BZJ:[id number] DOB: 1956-02-28 DOA: 02/21/2019 PCP: Charolette Forward, MD   Brief Narrative:  Patient is a 63 year old female with history of chronic diastolic congestive heart failure, COPD, morbid obesity, sleep apnea, hypertension, hyperlipidemia, GERD, diabetes type 2, A. fib on Pradaxa who presents with a complaints of nausea vomiting and diarrhea since a week.  On presentation, her sugars were found to be elevated along with elevated anion gap.  Found to have acute kidney injury, hyperkalemia and she was in A. fib with RVR and HHNS.  Patient was started on insulin drip. Insulin drip has been discontinued this morning.  Started on long-acting and sliding scale insulin.   Patient's nausea, vomiting have improved today.  Still mildly hypertensive.  Antihypertensives stopped.  Blood sugars still uncontrolled.  Assessment & Plan:   Principal Problem:   HHNC (hyperglycemic hyperosmolar nonketotic coma) ([place]) Active Problems:   Diabetes mellitus ([place])   Morbid obesity (HCC)   Atrial fibrillation (HCC)   Gastroparesis   OSA (obstructive sleep apnea)   Chest pain   Nausea vomiting and diarrhea   Congestive heart failure with left ventricular diastolic dysfunction, acute (HCC)   Hypokalemia   Acute kidney injury superimposed on CKD ([place])   Hyper osmolar hyperglycemic nonketotic syndrome/diabetes type 2: Presented with severe hyperglycemia, nausea, vomiting.  Mildly elevated anion gap but no ketones in the urine.  Initially started on insulin drip. Drip has been stopped.  Started on Lantus and sliding scale insulin.  This morning her nausea has improved.  Increase the dose of Lantus. Hemoglobin A1c in the range of 13.  Diabetic coordinator consulted.  She is on insulin at home.  Persistent nausea/vomiting: Most likely secondary to diabetic gastroparesis.  Reglan not started due to prolonged QTc interval.  Continue Zofran, gentle IV fluids.  AKI on CKD  stage III: Baseline creatinine ranges from 1.5-1.9.  Presented with creatinine in the range of 2.  Kidney function improved with IV fluids.  A. fib with RVR: Currently rate is controlled.  She is on Pradaxa for anticoagulation.  On  Metoprolol, digoxin for rate control.  Hypertension: Antihypertensives on hold due to relative hypotension.  We will continue to monitor blood pressure.  Chest pain: Resolved.  Nonspecific changes in the EKG.  History of congestive heart failure with preserved ejection fraction: Currently dehydrated.  Continue gentle IV fluids.  Will be cautious with IV fluids.  On Lasix at home.  Follows with Dr. Terrence Dupont, cardiology  Hypokalemia: Being supplemented  Obstructive sleep apnea: On CPAP at home.  Will continue CPAP here.  Morbid obesity: BMI of 43.23  Debility/deconditioning: We will request physical therapy evaluation.  Will request for case manager for PCP follow-up.         DVT prophylaxis: Pradaxa Code Status: Full Family Communication: None present at the bedside Disposition Plan: Likely home tomorrow   Consultants: None  Procedures: None Antimicrobials:  Anti-infectives (From admission, onward)   None      Subjective:  Patient seen and examined the bedside this morning.  Much comfortable this morning.  Nausea has improved.  Diet advanced to solid.  Denies any abdominal pain or vomiting.  No diarrhea.  Objective: Vitals:   02/23/19 0000 02/23/19 0300 02/23/19 0400 02/23/19 0730  BP:  (!) 94/59  92/75  Pulse: 71 67 77 81  Resp: (!) 21 17 14 20   Temp:  (!) 97.5 F (36.4 C)  97.7 F (36.5 C)  TempSrc:  Oral  Oral  SpO2: 95% (!) 88% 97% 97%  Weight:      Height:        Intake/Output Summary (Last 24 hours) at 02/23/2019 1121 Last data filed at 02/23/2019 0800 Gross per 24 hour  Intake 3880 ml  Output 1900 ml  Net 1980 ml   Filed Weights   02/21/19 1700 02/21/19 2100  Weight: 125.2 kg 125.2 kg    Examination:   General exam:  Not in distress, morbidly obese  HEENT:PERRL,Oral mucosa moist, Ear/Nose normal on gross exam Respiratory system: Bilateral equal air entry, normal vesicular breath sounds, no wheezes or crackles  Cardiovascular system: S1 & S2 heard, RRR. No JVD, murmurs, rubs, gallops or clicks. Gastrointestinal system: Abdomen is nondistended, soft and nontender. No organomegaly or masses felt. Normal bowel sounds heard. Central nervous system: Alert and oriented. No focal neurological deficits. Extremities: No edema, no clubbing ,no cyanosis, distal peripheral pulses palpable.  PICC line on the right arm Skin: No rashes, lesions or ulcers,no icterus ,no pallor .    Data Reviewed: I have personally reviewed following labs and imaging studies  CBC: Recent Labs  Lab 02/21/19 1116  WBC 9.0  NEUTROABS 5.9  HGB 16.7*  HCT 49.2*  MCV 87.1  PLT 588   Basic Metabolic Panel: Recent Labs  Lab 02/21/19 1116 02/22/19 0844 02/23/19 0457  NA 126* 129* 130*  K 3.2* 2.8* 3.1*  CL 79* 87* 93*  CO2 31 30 27   GLUCOSE 676* 185* 322*  BUN 30* 27* 25*  CREATININE 2.14* 1.67* 1.54*  CALCIUM 8.9 8.4* 8.4*  MG 1.8  --   --    GFR: Estimated Creatinine Clearance: 52 mL/min (A) (by C-G formula based on SCr of 1.54 mg/dL (H)). Liver Function Tests: Recent Labs  Lab 02/21/19 1116  AST 28  ALT 17  ALKPHOS 63  BILITOT 1.9*  PROT 9.0*  ALBUMIN 3.5   Recent Labs  Lab 02/21/19 1116  LIPASE 26   No results for input(s): AMMONIA in the last 168 hours. Coagulation Profile: No results for input(s): INR, PROTIME in the last 168 hours. Cardiac Enzymes: No results for input(s): CKTOTAL, CKMB, CKMBINDEX, TROPONINI in the last 168 hours. BNP (last 3 results) No results for input(s): PROBNP in the last 8760 hours. HbA1C: Recent Labs    02/22/19 0844  HGBA1C 13.8*   CBG: Recent Labs  Lab 02/22/19 0904 02/22/19 1111 02/22/19 1614 02/22/19 2121 02/23/19 0628  GLUCAP 211* 286* 360* 250* 361*    Lipid Profile: No results for input(s): CHOL, HDL, LDLCALC, TRIG, CHOLHDL, LDLDIRECT in the last 72 hours. Thyroid Function Tests: No results for input(s): TSH, T4TOTAL, FREET4, T3FREE, THYROIDAB in the last 72 hours. Anemia Panel: No results for input(s): VITAMINB12, FOLATE, FERRITIN, TIBC, IRON, RETICCTPCT in the last 72 hours. Sepsis Labs: No results for input(s): PROCALCITON, LATICACIDVEN in the last 168 hours.  Recent Results (from the past 240 hour(s))  SARS Coronavirus 2 (CEPHEID- Performed in [place] hospital lab), Hosp Order     Status: None   Collection Time: 02/21/19 12:30 PM   Specimen: Nasopharyngeal Swab  Result Value Ref Range Status   SARS Coronavirus 2 NEGATIVE NEGATIVE Final    Comment: (NOTE) If result is NEGATIVE SARS-CoV-2 target nucleic acids are NOT DETECTED. The SARS-CoV-2 RNA is generally detectable in upper and lower  respiratory specimens during the acute phase of infection. The lowest  concentration of SARS-CoV-2 viral copies this assay can detect is 250  copies / mL. A negative result  does not preclude SARS-CoV-2 infection  and should not be used as the sole basis for treatment or other  patient management decisions.  A negative result may occur with  improper specimen collection / handling, submission of specimen other  than nasopharyngeal swab, presence of viral mutation(s) within the  areas targeted by this assay, and inadequate number of viral copies  (<250 copies / mL). A negative result must be combined with clinical  observations, patient history, and epidemiological information. If result is POSITIVE SARS-CoV-2 target nucleic acids are DETECTED. The SARS-CoV-2 RNA is generally detectable in upper and lower  respiratory specimens dur ing the acute phase of infection.  Positive  results are indicative of active infection with SARS-CoV-2.  Clinical  correlation with patient history and other diagnostic information is  necessary to  determine patient infection status.  Positive results do  not rule out bacterial infection or co-infection with other viruses. If result is PRESUMPTIVE POSTIVE SARS-CoV-2 nucleic acids MAY BE PRESENT.   A presumptive positive result was obtained on the submitted specimen  and confirmed on repeat testing.  While 2019 novel coronavirus  (SARS-CoV-2) nucleic acids may be present in the submitted sample  additional confirmatory testing may be necessary for epidemiological  and / or clinical management purposes  to differentiate between  SARS-CoV-2 and other Sarbecovirus currently known to infect humans.  If clinically indicated additional testing with an alternate test  methodology [phone]) is advised. The SARS-CoV-2 RNA is generally  detectable in upper and lower respiratory sp ecimens during the acute  phase of infection. The expected result is Negative. Fact Sheet for Patients:  StrictlyIdeas.no Fact Sheet for Healthcare Providers: BankingDealers.co.za This test is not yet approved or cleared by the [place] FDA and has been authorized for detection and/or diagnosis of SARS-CoV-2 by FDA under an Emergency Use Authorization (EUA).  This EUA will remain in effect (meaning this test can be used) for the duration of the COVID-19 declaration under Section 564(b)(1) of the Act, 21 U.S.C. section 360bbb-3(b)(1), unless the authorization is terminated or revoked sooner. Performed at [facility], [place] [address] [place], [place] [address]   MRSA PCR Screening     Status: None   Collection Time: 02/21/19  4:23 PM   Specimen: Nasal Mucosa; Nasopharyngeal  Result Value Ref Range Status   MRSA by PCR NEGATIVE NEGATIVE Final    Comment:        The GeneXpert MRSA Assay (FDA approved for NASAL specimens only), is one component of a comprehensive MRSA colonization surveillance program. It is not intended to diagnose MRSA infection nor  to guide or monitor treatment for MRSA infections. Performed at [facility], [facility] [address], [place], [place] [address]          Radiology Studies: Ct Abdomen Pelvis W Contrast  Result Date: 02/21/2019 CLINICAL DATA:  Left lower quadrant pain. EXAM: CT ABDOMEN AND PELVIS WITH CONTRAST TECHNIQUE: Multidetector CT imaging of the abdomen and pelvis was performed using the standard protocol following bolus administration of intravenous contrast. CONTRAST:  143mL OMNIPAQUE IOHEXOL 300 MG/ML  SOLN COMPARISON:  CT abdomen pelvis dated June 30, 2018. FINDINGS: Lower chest: No acute abnormality. Hepatobiliary: No focal liver abnormality is seen. Status post cholecystectomy. No biliary dilatation. Pancreas: Unremarkable. No pancreatic ductal dilatation or surrounding inflammatory changes. Spleen: Normal in size without focal abnormality. Adrenals/Urinary Tract: The adrenal glands are unremarkable. No renal mass, calculi, or hydronephrosis. The bladder is unremarkable for the degree of distention.  Stomach/Bowel: Stomach is within normal limits. Appendix appears normal. No evidence of bowel wall thickening, distention, or inflammatory changes. 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 No enlarged abdominal or pelvic lymph nodes. Reproductive: Status post hysterectomy. Unchanged 3.6 cm complex cyst in the left ovary, stable since 2011, consistent with benign etiology. The right ovary is unremarkable. Other: No abdominal wall hernia or abnormality. No abdominopelvic ascites. No pneumoperitoneum. Musculoskeletal: No acute or significant osseous findings. IMPRESSION: 1.  No acute intra-abdominal process. 2. 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 3. Unchanged 3.6 cm complex cyst in the left ovary, stable since 2011. Electronically Signed   By: Titus Dubin M.D.   On: 02/21/2019 12:44   Dg Chest  Port 1 View  Result Date: 02/21/2019 CLINICAL DATA:  PICC line placement. EXAM: PORTABLE CHEST 1 VIEW COMPARISON:  September 02, 2018 FINDINGS: The right PICC line terminates in the central SVC. The cardiomediastinal silhouette is stable. No pneumothorax. The lungs remain clear. IMPRESSION: The distal tip of the right PICC line is in the central SVC. No other abnormalities. Electronically Signed   By: Dorise Bullion III M.D   On: 02/21/2019 21:32   Dg Chest Port 1 View  Result Date: 02/21/2019 CLINICAL DATA:  Crushing chest pain EXAM: PORTABLE CHEST 1 VIEW COMPARISON:  September 02, 2018 FINDINGS: The mediastinal contour is normal. The heart size is enlarged. There is no focal infiltrate, pulmonary edema, or pleural effusion. The visualized skeletal structures are stable. IMPRESSION: No active cardiopulmonary disease. Electronically Signed   By: Abelardo Diesel M.D.   On: 02/21/2019 13:20   [place] Ekg Site Rite  Result Date: 02/21/2019 If Site Rite image not attached, placement could not be confirmed due to current cardiac rhythm.       Scheduled Meds:  alum & mag hydroxide-simeth  30 mL Oral Once   And   lidocaine  15 mL Oral Once   cycloSPORINE  1 drop Both Eyes BID   dabigatran  75 mg Oral BID   digoxin  125 mcg Oral Daily   DULoxetine  60 mg Oral Daily   gabapentin  300 mg Oral QID   insulin aspart  0-20 Units Subcutaneous TID WC   insulin aspart  0-5 Units Subcutaneous QHS   insulin aspart  5 Units Subcutaneous TID WC   insulin glargine  30 Units Subcutaneous Daily   multivitamin with minerals  1 tablet Oral Daily   pantoprazole  40 mg Oral BID   potassium chloride SA  20 mEq Oral Daily   sodium chloride flush  10-40 mL Intracatheter Q12H   Continuous Infusions:  sodium chloride 75 mL/hr at 02/23/19 0800   potassium chloride 10 mEq (02/23/19 1037)     LOS: 2 days    Time spent: 35 mins.More than 50% of that time was spent in counseling and/or coordination of  care.      Shelly Coss, MD [facility] Pager [phone]  If 7PM-7AM, please contact night-coverage [email or web address] Password [email or web address] 02/23/2019, 11:21 AM

## 2019-02-23 NOTE — Progress Notes (Signed)
Inpatient Diabetes Program Recommendations  AACE/ADA: New Consensus Statement on Inpatient Glycemic Control (2015)  Target Ranges:  Prepandial:   less than 140 mg/dL      Peak postprandial:   less than 180 mg/dL (1-2 hours)      Critically ill patients:  140 - 180 mg/dL   Lab Results  Component Value Date   GLUCAP 361 (H) 02/23/2019   HGBA1C 13.8 (H) 02/22/2019    Review of Glycemic Control Results for MAIRE, GOVAN (MRN [id number]) as of 02/23/2019 10:11  Ref. Range 02/22/2019 16:14 02/22/2019 21:21 02/23/2019 06:28  Glucose-Capillary Latest Ref Range: 70 - 99 mg/dL 360 (H) 250 (H) 361 (H)  Results for HANNALEE, CASTOR (MRN [id number]) as of 02/23/2019 10:11  Ref. Range 07/01/2018 14:43 02/22/2019 08:44  Hemoglobin A1C Latest Ref Range: 4.8 - 5.6 % 8.1 (H) 13.8 (H)   Diabetes history: DM 2 Outpatient Diabetes medications:  Levemir 15 units bid, Novolog 0-10 units tid with meals Current orders for Inpatient glycemic control:  Novolog resistant tid with meals and HS Lantus 30 units daily Inpatient Diabetes Program Recommendations:    Please consider adding Novolog meal coverage 5 units tid with meals.  A1C indicates poor control of DM.  Unclear if patient was taking her insulin prior to admission. Will see patient on 02/24/19.   Thanks,  Adah Perl, RN, BC-ADM Inpatient Diabetes Coordinator Pager [phone] (8a-5p)

## 2019-02-24 LAB — GLUCOSE, CAPILLARY
Glucose-Capillary: 250 mg/dL — ABNORMAL HIGH (ref 70–99)
Glucose-Capillary: 308 mg/dL — ABNORMAL HIGH (ref 70–99)

## 2019-02-24 LAB — BASIC METABOLIC PANEL
Anion gap: 10 (ref 5–15)
BUN: 22 mg/dL (ref 8–23)
CO2: 26 mmol/L (ref 22–32)
Calcium: 8.7 mg/dL — ABNORMAL LOW (ref 8.9–10.3)
Chloride: 99 mmol/L (ref 98–111)
Creatinine, Ser: 1.64 mg/dL — ABNORMAL HIGH (ref 0.44–1.00)
GFR calc Af Amer: 38 mL/min — ABNORMAL LOW (ref >60)
GFR calc non Af Amer: 33 mL/min — ABNORMAL LOW (ref >60)
Glucose, Bld: 272 mg/dL — ABNORMAL HIGH (ref 70–99)
Potassium: 3.4 mmol/L — ABNORMAL LOW (ref 3.5–5.1)
Sodium: 135 mmol/L (ref 135–145)

## 2019-02-24 MED ORDER — FUROSEMIDE 40 MG PO TABS: 40.0000 mg | ORAL_TABLET | Freq: Every day | ORAL | 11 refills | Status: DC

## 2019-02-24 MED ORDER — INSULIN ASPART 100 UNIT/ML ~~LOC~~ SOLN: 8.0000 [IU] | Freq: Three times a day (TID) | SUBCUTANEOUS | 0 refills | Status: DC

## 2019-02-24 MED ORDER — POTASSIUM CHLORIDE CRYS ER 20 MEQ PO TBCR
40.0000 meq | EXTENDED_RELEASE_TABLET | Freq: Once | ORAL | Status: AC
  Administered 2019-02-24: 40 meq via ORAL
  Filled 2019-02-24: qty 2

## 2019-02-24 MED ORDER — INSULIN ASPART 100 UNIT/ML ~~LOC~~ SOLN
8.0000 [IU] | Freq: Three times a day (TID) | SUBCUTANEOUS | Status: DC
  Administered 2019-02-24: 8 [IU] via SUBCUTANEOUS

## 2019-02-24 MED ORDER — METOPROLOL TARTRATE 25 MG PO TABS: 25.0000 mg | ORAL_TABLET | Freq: Two times a day (BID) | ORAL | 0 refills | Status: DC

## 2019-02-24 MED ORDER — INSULIN DETEMIR 100 UNIT/ML ~~LOC~~ SOLN: 20.0000 [IU] | Freq: Two times a day (BID) | SUBCUTANEOUS | 11 refills | Status: DC

## 2019-02-24 MED ORDER — INSULIN GLARGINE 100 UNIT/ML ~~LOC~~ SOLN
35.0000 [IU] | Freq: Every day | SUBCUTANEOUS | Status: DC
  Administered 2019-02-24: 35 [IU] via SUBCUTANEOUS
  Filled 2019-02-24: qty 0.35

## 2019-02-24 NOTE — Consult Note (Signed)
   [facility] CM Inpatient Consult   02/24/2019  [name] [birth year] [id number]  Patient was assessed for [facility] for community services. Patient was previously active with [place] [facility].  However, the [facility] team was unable to maintain contact. Current disposition is for Home with home health is noted.  Chart review reveals patient currently does not have a primary care provider in [place] with [facility] Medicare noted.  Inpatient [place] RNCM set patient an appointment with [facility] on 03/04/2019. Patient is currently at a Medium score for unplanned readmission.  Call to patient's hospital  room and no answer.  For additional questions or referrals please contact:   Natividad Brood, RN BSN [facility] Liaison  [phone] business mobile phone Toll free office [phone]  Fax number: [phone] [email or web address]

## 2019-02-24 NOTE — TOC Transition Note (Signed)
Transition of Care [facility]) - CM/SW Discharge Note   Patient Details  Name: [name] MRN: [id number] Date of Birth: Dec 24, 1955  [facility]) CM/SW Contact:  Midge Minium RN, BSN, NCM-BC, ACM-RN [phone] Phone Number: 02/24/2019, 3:31 PM   Clinical Narrative:    CM following the patient for dispositional needs. CM spoke to the patient via phone to discuss the POC. The patient states living at home with her daughter/3 grandchildren and being independent with her ADLs, with any additional assistance being provided by her family. Patient confirmed not having an established PCP, but agreeable to [facility] f/u appointment arranged with: [place] on 03/04/19 @ 0930; AVS updated. CMS HH/DME compare list discussed via phone with Alvis Lemmings selected for [facility] and [facility] for DME needs. [name] referral given to [place], Alvis Lemmings liaison; DME referral given to Learta Codding, [facility] liaison; AVS updated. Patient indicated her family will provide transportation home. No further needs from CM.    Final next level of care: [place] Barriers to Discharge: No Barriers Identified   Patient Goals and CMS Choice Patient states their goals for this hospitalization and ongoing recovery are:: "to return home" CMS Medicare.gov Compare Post Acute Care list provided to:: Patient Choice offered to / list presented to : Patient   Discharge Plan and Services          DME Arranged: [place] rolling with seat DME Agency: [place] Date DME Agency Contacted: 02/24/19 Time DME Agency Contacted: [phone] Representative spoke with at DME Agency: Learta Codding liaison HH Arranged: RN, Disease Management, PT [place] Agency: [place] Date Many: 02/24/19 Time [place]: 1518 Representative spoke with at [place]: [place] liaison  Social Determinants of Health ([place]) Interventions     Readmission Risk  Interventions No flowsheet data found.

## 2019-02-24 NOTE — Progress Notes (Signed)
MD- Please make sure to renew pt's insulin rxs for home at time of discharge.  Recommend the following for discharge:  1. Continue Januvia 50 mg daily  2. Increase home dose of Levemir to 20 units BID (start tomorrow AM- 07/07) [This dose would be 0.3 units/kg dosing]  3. Continue pt's home Novolog SSI regimen  4. Add Novolog Meal Coverage to home regimen: Novolog 8 units TID with meals (give in addition to the SSI she already uses) (Add the following directions--Only give 1/2 if eating small meal and Hold if not able to eat)     Met with pt this AM to discuss current A1c and home DM care plan.  Pt told me she does not have a PCP--Trying to find one.  Her Cardiologist Dr. Terrence Dupont renewed her insulin Rxs last time.  Used to see. Dr. Montez Morita before he retired and has seen Dr. Jeanann Lewandowsky in the past for DM care.  Is interested in seeking care under an Endocrinologist and was asking me about insulin pumps.  Recommend to pt that she seek care under an ENDO (left several names of local ENDOs in the AVS).  Pt told me she checks her CBGs TID ac at home but never sees CBGs consistently greater than 250.  Does see CBGs in the 200-250 range.  Takes insulin and Januvia as prescribed.  Discussed with patient that given her A1c has risen dramatically from 8.1% to 13.8% in 7 months that the MD in the hospital may adjust her insulins at time of d/c.  Pt stated she was OK with that and asked me to have the MD renew her insulin Rxs for her until she can get an appt with a PCP or ENDO.  Did not have any questions for me regarding her diabetes care.  Spoke with patient about her current A1c of 13.8%.  Explained what an A1c is and what it measures.  Reminded patient that her goal A1c is 7% or less per ADA standards to prevent both acute and long-term complications.  Explained to patient the extreme importance of good glucose control at home.  Encouraged patient to check her CBGs at least TID AC at home (and  occasionally after meals) and to record all CBGs in a logbook for her PCP or Endocrinologist to review.  Also reviewed CBG goals for home.    --Will follow patient during hospitalization--  Wyn Quaker RN, MSN, CDE Diabetes Coordinator Inpatient Glycemic Control Team Team Pager: [phone] (8a-5p)

## 2019-02-24 NOTE — Care Management Important Message (Signed)
Important Message  Patient Details  Name: [name] MRN: [id number] Date of Birth: 23-Dec-1955   Medicare Important Message Given:  Yes     Memory Argue 02/24/2019, 1:49 PM    SIGNED

## 2019-02-24 NOTE — Discharge Instructions (Signed)
Local Diabetes Specialists:  1. [facility]: Dr. Cruzita Lederer [phone]) 2. [facility]: Dr. Chalmers Cater [phone]) 3. [facility]: [phone])   Fingerstick glucose (sugar) goals for home: Before meals: 80-130 mg/dl 2-Hours after meals: less than 180 mg/dl Hemoglobin A1c goal: 7% or less  Symptoms of Low Blood Sugars: Silly, Sweaty, Shaky Check sugar if you have your meter.  If near or less than 70 mg/dl, treat with 1/2 cup juice or soda or take glucose tablets Check sugar 15 minutes after treatment.  If sugar still near or less than 70 mg/al and symptomatic, treat again and may need a snack with some protein (peanut butter with crackers, etc)  Symptoms of High Blood Sugars:  Tired, Thirsty, Tinkling (urinating) a lot Check sugar every 4 hours while sugars are high Drink plenty of water Take your Novolog Sliding Scale Insulin every 4 hours if sugars stay high Call your MD

## 2019-02-24 NOTE — Progress Notes (Addendum)
Inpatient Diabetes Program Recommendations  AACE/ADA: New Consensus Statement on Inpatient Glycemic Control (2015)  Target Ranges:  Prepandial:   less than 140 mg/dL      Peak postprandial:   less than 180 mg/dL (1-2 hours)      Critically ill patients:  140 - 180 mg/dL   Results for [name], [name] (MRN [id number]) as of 02/24/2019 07:03  Ref. Range 02/23/2019 06:28 02/23/2019 11:52 02/23/2019 16:21 02/23/2019 21:43  Glucose-Capillary Latest Ref Range: 70 - 99 mg/dL 361 (H)  20 units NOVOLOG  327 (H)  20 units NOVOLOG +  30 units LANTUS  283 (H)  16 units NOVOLOG  268 (H)  3 units NOVOLOG    Results for [name], [name] (MRN [id number]) as of 02/24/2019 07:03  Ref. Range 02/24/2019 06:22  Glucose-Capillary Latest Ref Range: 70 - 99 mg/dL 250 (H)   Results for [name], [name] (MRN [id number]) as of 02/24/2019 07:03  Ref. Range 07/01/2018 14:43 02/22/2019 08:44  Hemoglobin A1C Latest Ref Range: 4.8 - 5.6 % 8.1 (H)  (185 mg/dl) 13.8 (H)  (349 mg/dl)    Admit with [facility]NK (glucose 676, AG 16, CO2 31)  History: DM, CHF, COPD   Home DM Meds: Levemir 15 units BID + Novolog 0-10 units TID per SSI + Januvia 50 mg Daily  Current Orders: Lantus 30 units Daily      Novolog Resistant Correction Scale/ SSI (0-20 units) TID AC + HS      Novolog 5 units TID with meals    PCP: Dr. Terrence Dupont    MD- Please consider the following in-hospital insulin adjustments:  1. Increase Lantus to 35 units Daily (if 30 unit dose already given this AM, please place orders for an additional 5 units Lantus X 1 dose to be given this AM)   2. Increase Novolog Meal Coverage to: Novolog 8 units TID with meals   Please consider sending patient home with Rx for Novolog Meal Coverage in addition to the Novolog SSI she takes at home.  Please also consider increasing pt's home dose of Levemir.   Plan to speak with pt today about why her A1c increased from 8 to 13% over the last several  months    --Will follow patient during hospitalization--  Wyn Quaker RN, MSN, CDE Diabetes Coordinator Inpatient Glycemic Control Team Team Pager: [phone] (8a-5p)

## 2019-02-24 NOTE — Evaluation (Signed)
Physical Therapy Evaluation Patient Details Name: [name] MRN: [id number] DOB: 05-09-1956 Today's Date: 02/24/2019   History of Present Illness  Patient is a 63 year old female with history of chronic diastolic congestive heart failure, COPD, morbid obesity, sleep apnea, hypertension, hyperlipidemia, GERD, diabetes type 2, A. fib on Pradaxa who presents with a complaints of nausea vomiting and diarrhea since a week.  Found to have acute kidney injury, hyperkalemia and she was in A. fib with RVR and HHNS.  Pt mildly hypertensive.   Clinical Impression  Pt admitted with above diagnosis. Pt currently with functional limitations due to the deficits listed below (see PT Problem List). Pt was able to ambulate with cane with min guard assist. Discussed use of rollator to incr balance and for decr endurance so pt can rest prn.  Pt is interested in rollator.  Also will benefit from HHPT.  Will follow acutely.  Pt will benefit from skilled PT to increase their independence and safety with mobility to allow discharge to the venue listed below.      Follow Up Recommendations Home health PT;Supervision - Intermittent    Equipment Recommendations  (wide rollator with O2 carrier)    Recommendations for Other Services       Precautions / Restrictions Precautions Precautions: Fall Restrictions Weight Bearing Restrictions: No      Mobility  Bed Mobility               General bed mobility comments: on toilet on arrival  Transfers Overall transfer level: Needs assistance Equipment used: Straight cane Transfers: Sit to/from Stand Sit to Stand: Min guard         General transfer comment: did not need assist to rise from toilet but did use bars.  Pt able to stand from recliner as well.   Ambulation/Gait Ambulation/Gait assistance: Min guard Gait Distance (Feet): 90 Feet Assistive device: Straight cane Gait Pattern/deviations: Step-through pattern;Decreased stride length;Trunk  flexed;Wide base of support   Gait velocity interpretation: <1.31 ft/sec, indicative of household ambulator General Gait Details: Pt able to ambulate with cane with min guard assist without LOB.  Pt occasionally needed HHA on the other UE for added stability.  Pt DOE 3/4 and sats >94% with ambulation. Discussed use of rollator at home and got a rollator to show pt how to use properly.   Stairs            Wheelchair Mobility    Modified Rankin (Stroke Patients Only)       Balance Overall balance assessment: Needs assistance Sitting-balance support: No upper extremity supported;Feet supported Sitting balance-Leahy Scale: Fair     Standing balance support: Bilateral upper extremity supported;During functional activity Standing balance-Leahy Scale: Poor Standing balance comment: Relies on UE support for balance.                              Pertinent Vitals/Pain Pain Assessment: No/denies pain    Home Living Family/patient expects to be discharged to:: Private residence Living Arrangements: Alone Available Help at Discharge: Family;Available PRN/intermittently Type of Home: Apartment Home Access: Stairs to enter Entrance Stairs-Rails: Left Entrance Stairs-Number of Steps: 3 Home Layout: One level Home Equipment: Walker - 2 wheels;Shower seat;Cane - single point(home O2 )      Prior Function Level of Independence: Needs assistance   Gait / Transfers Assistance Needed: walks with cane usually  ADL's / Homemaking Assistance Needed: supervised by daughter or grandaughter in shower  Hand Dominance   Dominant Hand: Right    Extremity/Trunk Assessment   Upper Extremity Assessment Upper Extremity Assessment: Defer to OT evaluation    Lower Extremity Assessment Lower Extremity Assessment: Generalized weakness    Cervical / Trunk Assessment Cervical / Trunk Assessment: Normal  Communication   Communication: No difficulties  Cognition  Arousal/Alertness: Awake/alert Behavior During Therapy: WFL for tasks assessed/performed Overall Cognitive Status: Within Functional Limits for tasks assessed                                        General Comments      Exercises     Assessment/Plan    PT Assessment Patient needs continued PT services  PT Problem List Decreased mobility;Decreased balance;Decreased activity tolerance;Decreased knowledge of use of DME;Decreased safety awareness;Decreased knowledge of precautions;Cardiopulmonary status limiting activity       PT Treatment Interventions DME instruction;Gait training;Functional mobility training;Therapeutic activities;Therapeutic exercise;Balance training;Stair training;Patient/family education    PT Goals (Current goals can be found in the Care Plan section)  Acute Rehab PT Goals Patient Stated Goal: to go home PT Goal Formulation: With patient Time For Goal Achievement: 03/10/19 Potential to Achieve Goals: Good    Frequency Min 3X/week   Barriers to discharge        Co-evaluation               AM-PAC PT "6 Clicks" Mobility  Outcome Measure Help needed turning from your back to your side while in a flat bed without using bedrails?: None Help needed moving from lying on your back to sitting on the side of a flat bed without using bedrails?: None Help needed moving to and from a bed to a chair (including a wheelchair)?: A Little Help needed standing up from a chair using your arms (e.g., wheelchair or bedside chair)?: A Little Help needed to walk in hospital room?: A Little Help needed climbing 3-5 steps with a railing? : A Little 6 Click Score: 20    End of Session Equipment Utilized During Treatment: Gait belt;Oxygen(Pt wanted to apply O2 after walk due to DOE 3/4) Activity Tolerance: Patient limited by fatigue Patient left: in chair;with call bell/phone within reach;with chair alarm set Nurse Communication: Mobility status PT  Visit Diagnosis: Unsteadiness on feet (R26.81);Muscle weakness (generalized) (M62.81)    Time: 0454-0981 PT Time Calculation (min) (ACUTE ONLY): 18 min   Charges:   PT Evaluation $PT Eval Moderate Complexity: [place] Pager:  [phone]  Office:  [phone]    Denice Paradise 02/24/2019, 2:14 PM

## 2019-02-24 NOTE — Discharge Summary (Signed)
Physician Discharge Summary  Talia Hoheisel DGL:[id number] DOB: 10/15/1955 DOA: 02/21/2019  PCP: Charolette Forward, MD  Admit date: 02/21/2019 Discharge date: 02/24/2019  Admitted From: Home Disposition:  Home  Discharge Condition:Stable CODE STATUS:FULL Diet recommendation: Heart Healthy   Brief/Interim Summary:  Patient is a 63 year old female with history of chronic diastolic congestive heart failure, COPD, morbid obesity, sleep apnea, hypertension, hyperlipidemia, GERD, diabetes type 2, A. fib on Pradaxa who presents with a complaints of nausea vomiting and diarrhea since a week.  On presentation, her sugars were found to be elevated along with elevated anion gap.  Found to have acute kidney injury, hyperkalemia and she was in A. fib with RVR and HHNS.  Patient was started on insulin drip. Insulin drip has been discontinued .  Started on long-acting and sliding scale insulin.   Patient's nausea, vomiting have improved today.     She was seen by diabetic coordinator during this hospitalization.  She feels better today.  She was seen by physical therapy and recommended home health on discharge.  She is hemodynamically stable for discharge today.  Following problems were addressed during her hospitalization:  Hyper osmolar hyperglycemic nonketotic syndrome/diabetes type 2: Presented with severe hyperglycemia, nausea, vomiting.  Mildly elevated anion gap but no ketones in the urine.  Initially started on insulin drip. Drip has been stopped.  Started on Lantus and sliding scale insulin.  This morning her nausea has improved.   Hemoglobin A1c in the range of 13.  Diabetic coordinator consulted.  She is on insulin at home.  Persistent nausea/vomiting: Most likely secondary to diabetic gastroparesis.  Reglan not started due to prolonged QTc interval.  No nausea or vomiting today.  AKI on CKD stage III: Baseline creatinine ranges from 1.5-1.9.  Presented with creatinine in the range of 2.   Kidney function improved with IV fluids.  A. fib with RVR: Currently rate is controlled.  She is on Pradaxa for anticoagulation.  On  Metoprolol, digoxin for rate control.  Hypertension: Antihypertensives were on hold due to relative hypotension.  We will discontinue losartan.  Will decrease the dose of metoprolol to 25 mg twice a day.  Chest pain: Resolved.  Nonspecific changes in the EKG.  History of congestive heart failure with preserved ejection fraction: On Lasix at home.  Follows with Dr. Terrence Dupont, cardiology.  Dose of Lasix decreased to 40 mg a day  Hypokalemia: Supplemented  Obstructive sleep apnea: On CPAP at home.  Will continue CPAP here.  Morbid obesity: BMI of 43.23  Debility/deconditioning: PT recommended home health   Discharge Diagnoses:  Principal Problem:   HHNC (hyperglycemic hyperosmolar nonketotic coma) ([place]) Active Problems:   Diabetes mellitus ([place])   Morbid obesity (HCC)   Atrial fibrillation (HCC)   Gastroparesis   OSA (obstructive sleep apnea)   Chest pain   Nausea vomiting and diarrhea   Congestive heart failure with left ventricular diastolic dysfunction, acute (HCC)   Hypokalemia   Acute kidney injury superimposed on CKD [facility])    Discharge Instructions  Discharge Instructions    Diet - low sodium heart healthy   Complete by: As directed    Discharge instructions   Complete by: As directed    1)Please follow up with your PCP Dr Terrence Dupont in a week.Do a CBC and BMP test during the follow up. 2)Take prescribed medicines as instructed.We have adjusted the doses of your medicines. 3)Please monitor the blood glucose at home.Monitor your blood pressure.   Increase activity slowly   Complete by: As  directed      Allergies as of 02/24/2019      Reactions   Sulfa Antibiotics Itching      Medication List    STOP taking these medications   losartan 25 MG tablet Commonly known as: COZAAR     TAKE these medications   acetaminophen 325  MG tablet Commonly known as: TYLENOL Take 2 tablets (650 mg total) by mouth every 6 (six) hours as needed for mild pain, moderate pain or headache.   digoxin 0.125 MG tablet Commonly known as: Lanoxin Take 1 tablet (125 mcg total) by mouth daily.   DULoxetine 60 MG capsule Commonly known as: CYMBALTA Take 1 capsule (60 mg total) by mouth daily.   furosemide 40 MG tablet Commonly known as: Lasix Take 1 tablet (40 mg total) by mouth daily. What changed: when to take this   gabapentin 300 MG capsule Commonly known as: NEURONTIN Take 300 mg by mouth 4 (four) times daily.   insulin aspart 100 UNIT/ML injection Commonly known as: novoLOG Inject 8 Units into the skin 3 (three) times daily with meals. Take only when your eat more than 50% of your meals What changed: You were already taking a medication with the same name, and this prescription was added. Make sure you understand how and when to take each.   insulin aspart 100 UNIT/ML injection Commonly known as: novoLOG Inject 0-10 Units into the skin. PER SLIDING SCALE What changed: Another medication with the same name was added. Make sure you understand how and when to take each.   insulin detemir 100 UNIT/ML injection Commonly known as: LEVEMIR Inject 0.2 mLs (20 Units total) into the skin 2 (two) times daily. What changed: how much to take   Januvia 50 MG tablet Generic drug: sitaGLIPtin Take 50 mg by mouth daily.   metoprolol tartrate 25 MG tablet Commonly known as: LOPRESSOR Take 1 tablet (25 mg total) by mouth 2 (two) times daily. What changed:   medication strength  how much to take   MULTIVITAMIN WOMEN PO Take 1 tablet by mouth daily.   nitroGLYCERIN 0.4 MG SL tablet Commonly known as: NITROSTAT Place 1 tablet (0.4 mg total) under the tongue every 5 (five) minutes x 3 doses as needed for chest pain.   pantoprazole 40 MG tablet Commonly known as: PROTONIX Take 1 tablet (40 mg total) by mouth 2 (two) times  daily.   potassium chloride SA 20 MEQ tablet Commonly known as: K-DUR Take 1 tablet (20 mEq total) by mouth daily.   Pradaxa 75 MG Caps capsule Generic drug: dabigatran Take 75 mg by mouth 2 (two) times daily.   ProAir HFA 108 (90 Base) MCG/ACT inhaler Generic drug: albuterol Inhale 2 puffs into the lungs every 4 (four) hours as needed for wheezing (wheezing).   Restasis 0.05 % ophthalmic emulsion Generic drug: cycloSPORINE Place 1 drop into both eyes 2 (two) times daily.      Follow-up Information    Charolette Forward, MD. Schedule an appointment as soon as possible for a visit in 1 week(s).   Specialty: Cardiology Contact information: [place] [address] [phone]          Allergies  Allergen Reactions  . Sulfa Antibiotics Itching    Consultations:  None   Procedures/Studies: Ct Abdomen Pelvis W Contrast  Result Date: 02/21/2019 CLINICAL DATA:  Left lower quadrant pain. EXAM: CT ABDOMEN AND PELVIS WITH CONTRAST TECHNIQUE: Multidetector CT imaging of the abdomen and pelvis  was performed using the standard protocol following bolus administration of intravenous contrast. CONTRAST:  146mL OMNIPAQUE IOHEXOL 300 MG/ML  SOLN COMPARISON:  CT abdomen pelvis dated June 30, 2018. FINDINGS: Lower chest: No acute abnormality. Hepatobiliary: No focal liver abnormality is seen. Status post cholecystectomy. No biliary dilatation. Pancreas: Unremarkable. No pancreatic ductal dilatation or surrounding inflammatory changes. Spleen: Normal in size without focal abnormality. Adrenals/Urinary Tract: The adrenal glands are unremarkable. No renal mass, calculi, or hydronephrosis. The bladder is unremarkable for the degree of distention. Stomach/Bowel: Stomach is within normal limits. Appendix appears normal. No evidence of bowel wall thickening, distention, or inflammatory changes. 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 No enlarged abdominal or pelvic lymph nodes. Reproductive: Status post hysterectomy. Unchanged 3.6 cm complex cyst in the left ovary, stable since 2011, consistent with benign etiology. The right ovary is unremarkable. Other: No abdominal wall hernia or abnormality. No abdominopelvic ascites. No pneumoperitoneum. Musculoskeletal: No acute or significant osseous findings. IMPRESSION: 1.  No acute intra-abdominal process. 2. 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 3. Unchanged 3.6 cm complex cyst in the left ovary, stable since 2011. Electronically Signed   By: Titus Dubin M.D.   On: 02/21/2019 12:44   Dg Chest Port 1 View  Result Date: 02/21/2019 CLINICAL DATA:  PICC line placement. EXAM: PORTABLE CHEST 1 VIEW COMPARISON:  September 02, 2018 FINDINGS: The right PICC line terminates in the central SVC. The cardiomediastinal silhouette is stable. No pneumothorax. The lungs remain clear. IMPRESSION: The distal tip of the right PICC line is in the central SVC. No other abnormalities. Electronically Signed   By: Dorise Bullion III M.D   On: 02/21/2019 21:32   Dg Chest Port 1 View  Result Date: 02/21/2019 CLINICAL DATA:  Crushing chest pain EXAM: PORTABLE CHEST 1 VIEW COMPARISON:  September 02, 2018 FINDINGS: The mediastinal contour is normal. The heart size is enlarged. There is no focal infiltrate, pulmonary edema, or pleural effusion. The visualized skeletal structures are stable. IMPRESSION: No active cardiopulmonary disease. Electronically Signed   By: Abelardo Diesel M.D.   On: 02/21/2019 13:20   [place] Ekg Site Rite  Result Date: 02/21/2019 If Site Rite image not attached, placement could not be confirmed due to current cardiac rhythm.      Subjective:   Patient seen and examined the bedside this morning.  Hemodynamically stable.  Blood pressure better today but is still soft.  Stable for discharge to home.  Discharge  Exam: Vitals:   02/24/19 0811 02/24/19 1055  BP: (!) 118/108 106/61  Pulse:    Resp: (!) 23 (!) 22  Temp: 98 F (36.7 C) (!) 97.5 F (36.4 C)  SpO2:     Vitals:   02/24/19 0000 02/24/19 0300 02/24/19 0811 02/24/19 1055  BP:  107/61 (!) 118/108 106/61  Pulse: 91 93    Resp: 20 19 (!) 23 (!) 22  Temp:  (!) 97.5 F (36.4 C) 98 F (36.7 C) (!) 97.5 F (36.4 C)  TempSrc:  Oral Axillary Oral  SpO2: 97% 97%    Weight:      Height:        General: Pt is alert, awake, not in acute distress Cardiovascular: RRR, S1/S2 +, no rubs, no gallops Respiratory: CTA bilaterally, no wheezing, no rhonchi Abdominal: Soft, NT, ND, bowel sounds + Extremities: no edema, no cyanosis    The results of significant diagnostics  from this hospitalization (including imaging, microbiology, ancillary and laboratory) are listed below for reference.     Microbiology: Recent Results (from the past 240 hour(s))  SARS Coronavirus 2 (CEPHEID- Performed in [facility] hospital lab), Hosp Order     Status: None   Collection Time: 02/21/19 12:30 PM   Specimen: Nasopharyngeal Swab  Result Value Ref Range Status   SARS Coronavirus 2 NEGATIVE NEGATIVE Final    Comment: (NOTE) If result is NEGATIVE SARS-CoV-2 target nucleic acids are NOT DETECTED. The SARS-CoV-2 RNA is generally detectable in upper and lower  respiratory specimens during the acute phase of infection. The lowest  concentration of SARS-CoV-2 viral copies this assay can detect is 250  copies / mL. A negative result does not preclude SARS-CoV-2 infection  and should not be used as the sole basis for treatment or other  patient management decisions.  A negative result may occur with  improper specimen collection / handling, submission of specimen other  than nasopharyngeal swab, presence of viral mutation(s) within the  areas targeted by this assay, and inadequate number of viral copies  (<250 copies / mL). A negative result must be combined with  clinical  observations, patient history, and epidemiological information. If result is POSITIVE SARS-CoV-2 target nucleic acids are DETECTED. The SARS-CoV-2 RNA is generally detectable in upper and lower  respiratory specimens dur ing the acute phase of infection.  Positive  results are indicative of active infection with SARS-CoV-2.  Clinical  correlation with patient history and other diagnostic information is  necessary to determine patient infection status.  Positive results do  not rule out bacterial infection or co-infection with other viruses. If result is PRESUMPTIVE POSTIVE SARS-CoV-2 nucleic acids MAY BE PRESENT.   A presumptive positive result was obtained on the submitted specimen  and confirmed on repeat testing.  While 2019 novel coronavirus  (SARS-CoV-2) nucleic acids may be present in the submitted sample  additional confirmatory testing may be necessary for epidemiological  and / or clinical management purposes  to differentiate between  SARS-CoV-2 and other Sarbecovirus currently known to infect humans.  If clinically indicated additional testing with an alternate test  methodology [phone]) is advised. The SARS-CoV-2 RNA is generally  detectable in upper and lower respiratory sp ecimens during the acute  phase of infection. The expected result is Negative. Fact Sheet for Patients:  StrictlyIdeas.no Fact Sheet for Healthcare Providers: BankingDealers.co.za This test is not yet approved or cleared by the [place] FDA and has been authorized for detection and/or diagnosis of SARS-CoV-2 by FDA under an Emergency Use Authorization (EUA).  This EUA will remain in effect (meaning this test can be used) for the duration of the COVID-19 declaration under Section 564(b)(1) of the Act, 21 U.S.C. section 360bbb-3(b)(1), unless the authorization is terminated or revoked sooner. Performed at [facility], [place]  [address], [place],  [address]   Gastrointestinal Panel by PCR , Stool     Status: None   Collection Time: 02/21/19  2:40 PM   Specimen: Stool  Result Value Ref Range Status   Campylobacter species NOT DETECTED NOT DETECTED Final   Plesimonas shigelloides NOT DETECTED NOT DETECTED Final   Salmonella species NOT DETECTED NOT DETECTED Final   Yersinia enterocolitica NOT DETECTED NOT DETECTED Final   Vibrio species NOT DETECTED NOT DETECTED Final   Vibrio cholerae NOT DETECTED NOT DETECTED Final   Enteroaggregative E coli (EAEC) NOT DETECTED NOT DETECTED Final   Enteropathogenic E coli (EPEC) NOT  DETECTED NOT DETECTED Final   Enterotoxigenic E coli (ETEC) NOT DETECTED NOT DETECTED Final   Shiga like toxin producing E coli (STEC) NOT DETECTED NOT DETECTED Final   Shigella/Enteroinvasive E coli (EIEC) NOT DETECTED NOT DETECTED Final   Cryptosporidium NOT DETECTED NOT DETECTED Final   Cyclospora cayetanensis NOT DETECTED NOT DETECTED Final   Entamoeba histolytica NOT DETECTED NOT DETECTED Final   Giardia lamblia NOT DETECTED NOT DETECTED Final   Adenovirus F40/41 NOT DETECTED NOT DETECTED Final   Astrovirus NOT DETECTED NOT DETECTED Final   Norovirus GI/GII NOT DETECTED NOT DETECTED Final   Rotavirus A NOT DETECTED NOT DETECTED Final   Sapovirus (I, II, IV, and V) NOT DETECTED NOT DETECTED Final    Comment: Performed at [facility], [address] [place], [place] [address]  MRSA PCR Screening     Status: None   Collection Time: 02/21/19  4:23 PM   Specimen: Nasal Mucosa; Nasopharyngeal  Result Value Ref Range Status   MRSA by PCR NEGATIVE NEGATIVE Final    Comment:        The GeneXpert MRSA Assay (FDA approved for NASAL specimens only), is one component of a comprehensive MRSA colonization surveillance program. It is not intended to diagnose MRSA infection nor to guide or monitor treatment for MRSA infections. Performed at [facility], [place] [address]  [place], [place] [address]      Labs: BNP (last 3 results) Recent Labs    06/30/18 1809 09/02/18 1351 02/21/19 1116  BNP 331.9* 204.8* 73.7   Basic Metabolic Panel: Recent Labs  Lab 02/21/19 1116 02/22/19 0844 02/23/19 0457 02/24/19 0447  NA 126* 129* 130* 135  K 3.2* 2.8* 3.1* 3.4*  CL 79* 87* 93* 99  CO2 31 30 27 26   GLUCOSE 676* 185* 322* 272*  BUN 30* 27* 25* 22  CREATININE 2.14* 1.67* 1.54* 1.64*  CALCIUM 8.9 8.4* 8.4* 8.7*  MG 1.8  --  1.8  --    Liver Function Tests: Recent Labs  Lab 02/21/19 1116  AST 28  ALT 17  ALKPHOS 63  BILITOT 1.9*  PROT 9.0*  ALBUMIN 3.5   Recent Labs  Lab 02/21/19 1116  LIPASE 26   No results for input(s): AMMONIA in the last 168 hours. CBC: Recent Labs  Lab 02/21/19 1116  WBC 9.0  NEUTROABS 5.9  HGB 16.7*  HCT 49.2*  MCV 87.1  PLT 181   Cardiac Enzymes: No results for input(s): CKTOTAL, CKMB, CKMBINDEX, TROPONINI in the last 168 hours. BNP: Invalid input(s): POCBNP CBG: Recent Labs  Lab 02/23/19 1152 02/23/19 1621 02/23/19 2143 02/24/19 0622 02/24/19 1058  GLUCAP 327* 283* 268* 250* 308*   D-Dimer No results for input(s): DDIMER in the last 72 hours. Hgb A1c Recent Labs    02/22/19 0844  HGBA1C 13.8*   Lipid Profile No results for input(s): CHOL, HDL, LDLCALC, TRIG, CHOLHDL, LDLDIRECT in the last 72 hours. Thyroid function studies No results for input(s): TSH, T4TOTAL, T3FREE, THYROIDAB in the last 72 hours.  Invalid input(s): FREET3 Anemia work up No results for input(s): VITAMINB12, FOLATE, FERRITIN, TIBC, IRON, RETICCTPCT in the last 72 hours. Urinalysis    Component Value Date/Time   COLORURINE YELLOW 02/21/2019 0021   APPEARANCEUR CLEAR 02/21/2019 0021   LABSPEC 1.031 (H) 02/21/2019 0021   PHURINE 6.0 02/21/2019 0021   GLUCOSEU >=500 (A) 02/21/2019 0021   HGBUR NEGATIVE 02/21/2019 0021   BILIRUBINUR NEGATIVE 02/21/2019 0021   KETONESUR NEGATIVE 02/21/2019 0021  PROTEINUR NEGATIVE  02/21/2019 0021   UROBILINOGEN 1.0 10/26/2014 0249   NITRITE NEGATIVE 02/21/2019 0021   LEUKOCYTESUR NEGATIVE 02/21/2019 0021   Sepsis Labs Invalid input(s): PROCALCITONIN,  WBC,  LACTICIDVEN Microbiology Recent Results (from the past 240 hour(s))  SARS Coronavirus 2 (CEPHEID- Performed in [facility] hospital lab), Hosp Order     Status: None   Collection Time: 02/21/19 12:30 PM   Specimen: Nasopharyngeal Swab  Result Value Ref Range Status   SARS Coronavirus 2 NEGATIVE NEGATIVE Final    Comment: (NOTE) If result is NEGATIVE SARS-CoV-2 target nucleic acids are NOT DETECTED. The SARS-CoV-2 RNA is generally detectable in upper and lower  respiratory specimens during the acute phase of infection. The lowest  concentration of SARS-CoV-2 viral copies this assay can detect is 250  copies / mL. A negative result does not preclude SARS-CoV-2 infection  and should not be used as the sole basis for treatment or other  patient management decisions.  A negative result may occur with  improper specimen collection / handling, submission of specimen other  than nasopharyngeal swab, presence of viral mutation(s) within the  areas targeted by this assay, and inadequate number of viral copies  (<250 copies / mL). A negative result must be combined with clinical  observations, patient history, and epidemiological information. If result is POSITIVE SARS-CoV-2 target nucleic acids are DETECTED. The SARS-CoV-2 RNA is generally detectable in upper and lower  respiratory specimens dur ing the acute phase of infection.  Positive  results are indicative of active infection with SARS-CoV-2.  Clinical  correlation with patient history and other diagnostic information is  necessary to determine patient infection status.  Positive results do  not rule out bacterial infection or co-infection with other viruses. If result is PRESUMPTIVE POSTIVE SARS-CoV-2 nucleic acids MAY BE PRESENT.   A presumptive  positive result was obtained on the submitted specimen  and confirmed on repeat testing.  While 2019 novel coronavirus  (SARS-CoV-2) nucleic acids may be present in the submitted sample  additional confirmatory testing may be necessary for epidemiological  and / or clinical management purposes  to differentiate between  SARS-CoV-2 and other Sarbecovirus currently known to infect humans.  If clinically indicated additional testing with an alternate test  methodology [phone]) is advised. The SARS-CoV-2 RNA is generally  detectable in upper and lower respiratory sp ecimens during the acute  phase of infection. The expected result is Negative. Fact Sheet for Patients:  StrictlyIdeas.no Fact Sheet for Healthcare Providers: BankingDealers.co.za This test is not yet approved or cleared by the [place] FDA and has been authorized for detection and/or diagnosis of SARS-CoV-2 by FDA under an Emergency Use Authorization (EUA).  This EUA will remain in effect (meaning this test can be used) for the duration of the COVID-19 declaration under Section 564(b)(1) of the Act, 21 U.S.C. section 360bbb-3(b)(1), unless the authorization is terminated or revoked sooner. Performed at [facility], [place] [address], [place], [place] [address]   Gastrointestinal Panel by PCR , Stool     Status: None   Collection Time: 02/21/19  2:40 PM   Specimen: Stool  Result Value Ref Range Status   Campylobacter species NOT DETECTED NOT DETECTED Final   Plesimonas shigelloides NOT DETECTED NOT DETECTED Final   Salmonella species NOT DETECTED NOT DETECTED Final   Yersinia enterocolitica NOT DETECTED NOT DETECTED Final   Vibrio species NOT DETECTED NOT DETECTED Final   Vibrio cholerae NOT DETECTED NOT DETECTED Final   Enteroaggregative  E coli (EAEC) NOT DETECTED NOT DETECTED Final   Enteropathogenic E coli (EPEC) NOT DETECTED NOT DETECTED Final   Enterotoxigenic E  coli (ETEC) NOT DETECTED NOT DETECTED Final   Shiga like toxin producing E coli (STEC) NOT DETECTED NOT DETECTED Final   Shigella/Enteroinvasive E coli (EIEC) NOT DETECTED NOT DETECTED Final   Cryptosporidium NOT DETECTED NOT DETECTED Final   Cyclospora cayetanensis NOT DETECTED NOT DETECTED Final   Entamoeba histolytica NOT DETECTED NOT DETECTED Final   Giardia lamblia NOT DETECTED NOT DETECTED Final   Adenovirus F40/41 NOT DETECTED NOT DETECTED Final   Astrovirus NOT DETECTED NOT DETECTED Final   Norovirus GI/GII NOT DETECTED NOT DETECTED Final   Rotavirus A NOT DETECTED NOT DETECTED Final   Sapovirus (I, II, IV, and V) NOT DETECTED NOT DETECTED Final    Comment: Performed at [facility], [address] [place], [place] [address]  MRSA PCR Screening     Status: None   Collection Time: 02/21/19  4:23 PM   Specimen: Nasal Mucosa; Nasopharyngeal  Result Value Ref Range Status   MRSA by PCR NEGATIVE NEGATIVE Final    Comment:        The GeneXpert MRSA Assay (FDA approved for NASAL specimens only), is one component of a comprehensive MRSA colonization surveillance program. It is not intended to diagnose MRSA infection nor to guide or monitor treatment for MRSA infections. Performed at [facility], [place] [address], [place], [place] [address]     Please note: You were cared for by a hospitalist during your hospital stay. Once you are discharged, your primary care physician will handle any further medical issues. Please note that NO REFILLS for any discharge medications will be authorized once you are discharged, as it is imperative that you return to your primary care physician (or establish a relationship with a primary care physician if you do not have one) for your post hospital discharge needs so that they can reassess your need for medications and monitor your lab values.    Time coordinating discharge: 40 minutes  SIGNED:   Shelly Coss, MD  [facility] 02/24/2019, 2:38 PM Pager [phone]  If 7PM-7AM, please contact night-coverage [email or web address] Password [email or web address]

## 2019-02-25 DIAGNOSIS — J449 Chronic obstructive pulmonary disease, unspecified: Secondary | ICD-10-CM | POA: Diagnosis not present

## 2019-02-27 DIAGNOSIS — I5032 Chronic diastolic (congestive) heart failure: Secondary | ICD-10-CM | POA: Diagnosis not present

## 2019-02-27 DIAGNOSIS — N179 Acute kidney failure, unspecified: Secondary | ICD-10-CM | POA: Diagnosis not present

## 2019-02-27 DIAGNOSIS — I130 Hypertensive heart and chronic kidney disease with heart failure and stage 1 through stage 4 chronic kidney disease, or unspecified chronic kidney disease: Secondary | ICD-10-CM | POA: Diagnosis not present

## 2019-02-27 DIAGNOSIS — I4819 Other persistent atrial fibrillation: Secondary | ICD-10-CM | POA: Diagnosis not present

## 2019-02-27 DIAGNOSIS — E1122 Type 2 diabetes mellitus with diabetic chronic kidney disease: Secondary | ICD-10-CM | POA: Diagnosis not present

## 2019-02-27 DIAGNOSIS — E1101 Type 2 diabetes mellitus with hyperosmolarity with coma: Secondary | ICD-10-CM | POA: Diagnosis not present

## 2019-02-27 DIAGNOSIS — E876 Hypokalemia: Secondary | ICD-10-CM | POA: Diagnosis not present

## 2019-02-27 DIAGNOSIS — N183 Chronic kidney disease, stage 3 (moderate): Secondary | ICD-10-CM | POA: Diagnosis not present

## 2019-02-27 DIAGNOSIS — I5040 Unspecified combined systolic (congestive) and diastolic (congestive) heart failure: Secondary | ICD-10-CM | POA: Diagnosis not present

## 2019-02-27 DIAGNOSIS — J449 Chronic obstructive pulmonary disease, unspecified: Secondary | ICD-10-CM | POA: Diagnosis not present

## 2019-03-03 DIAGNOSIS — I509 Heart failure, unspecified: Secondary | ICD-10-CM | POA: Diagnosis not present

## 2019-03-03 DIAGNOSIS — I482 Chronic atrial fibrillation, unspecified: Secondary | ICD-10-CM | POA: Diagnosis not present

## 2019-03-03 DIAGNOSIS — E1122 Type 2 diabetes mellitus with diabetic chronic kidney disease: Secondary | ICD-10-CM | POA: Diagnosis not present

## 2019-03-03 DIAGNOSIS — G4733 Obstructive sleep apnea (adult) (pediatric): Secondary | ICD-10-CM | POA: Diagnosis not present

## 2019-03-03 DIAGNOSIS — I10 Essential (primary) hypertension: Secondary | ICD-10-CM | POA: Diagnosis not present

## 2019-03-03 DIAGNOSIS — E785 Hyperlipidemia, unspecified: Secondary | ICD-10-CM | POA: Diagnosis not present

## 2019-03-03 DIAGNOSIS — N189 Chronic kidney disease, unspecified: Secondary | ICD-10-CM | POA: Diagnosis not present

## 2019-03-03 DIAGNOSIS — I38 Endocarditis, valve unspecified: Secondary | ICD-10-CM | POA: Diagnosis not present

## 2019-03-03 DIAGNOSIS — I890 Lymphedema, not elsewhere classified: Secondary | ICD-10-CM | POA: Diagnosis not present

## 2019-03-04 ENCOUNTER — Other Ambulatory Visit: Payer: Self-pay

## 2019-03-04 ENCOUNTER — Ambulatory Visit (INDEPENDENT_AMBULATORY_CARE_PROVIDER_SITE_OTHER): Payer: Medicare HMO | Admitting: Licensed Clinical Social Worker

## 2019-03-04 ENCOUNTER — Ambulatory Visit (INDEPENDENT_AMBULATORY_CARE_PROVIDER_SITE_OTHER): Payer: Medicare HMO | Admitting: Primary Care

## 2019-03-04 ENCOUNTER — Encounter (INDEPENDENT_AMBULATORY_CARE_PROVIDER_SITE_OTHER): Payer: Self-pay | Admitting: Primary Care

## 2019-03-04 VITALS — BP 98/64 | HR 87 | Temp 98.4°F | Ht 67.0 in | Wt 307.2 lb

## 2019-03-04 DIAGNOSIS — Z7689 Persons encountering health services in other specified circumstances: Secondary | ICD-10-CM | POA: Diagnosis not present

## 2019-03-04 DIAGNOSIS — I5030 Unspecified diastolic (congestive) heart failure: Secondary | ICD-10-CM | POA: Diagnosis not present

## 2019-03-04 DIAGNOSIS — E1129 Type 2 diabetes mellitus with other diabetic kidney complication: Secondary | ICD-10-CM | POA: Diagnosis not present

## 2019-03-04 DIAGNOSIS — IMO0002 Reserved for concepts with insufficient information to code with codable children: Secondary | ICD-10-CM

## 2019-03-04 DIAGNOSIS — Z803 Family history of malignant neoplasm of breast: Secondary | ICD-10-CM | POA: Diagnosis not present

## 2019-03-04 DIAGNOSIS — G4700 Insomnia, unspecified: Secondary | ICD-10-CM

## 2019-03-04 DIAGNOSIS — E119 Type 2 diabetes mellitus without complications: Secondary | ICD-10-CM

## 2019-03-04 DIAGNOSIS — E1122 Type 2 diabetes mellitus with diabetic chronic kidney disease: Secondary | ICD-10-CM | POA: Diagnosis not present

## 2019-03-04 DIAGNOSIS — I890 Lymphedema, not elsewhere classified: Secondary | ICD-10-CM

## 2019-03-04 DIAGNOSIS — I482 Chronic atrial fibrillation, unspecified: Secondary | ICD-10-CM

## 2019-03-04 DIAGNOSIS — F419 Anxiety disorder, unspecified: Secondary | ICD-10-CM | POA: Diagnosis not present

## 2019-03-04 DIAGNOSIS — Z23 Encounter for immunization: Secondary | ICD-10-CM | POA: Diagnosis not present

## 2019-03-04 DIAGNOSIS — N183 Chronic kidney disease, stage 3 unspecified: Secondary | ICD-10-CM

## 2019-03-04 DIAGNOSIS — F418 Other specified anxiety disorders: Secondary | ICD-10-CM

## 2019-03-04 DIAGNOSIS — Z794 Long term (current) use of insulin: Secondary | ICD-10-CM | POA: Diagnosis not present

## 2019-03-04 DIAGNOSIS — Z6841 Body Mass Index (BMI) 40.0 and over, adult: Secondary | ICD-10-CM | POA: Diagnosis not present

## 2019-03-04 DIAGNOSIS — E1140 Type 2 diabetes mellitus with diabetic neuropathy, unspecified: Secondary | ICD-10-CM

## 2019-03-04 DIAGNOSIS — E1165 Type 2 diabetes mellitus with hyperglycemia: Secondary | ICD-10-CM

## 2019-03-04 DIAGNOSIS — E1159 Type 2 diabetes mellitus with other circulatory complications: Secondary | ICD-10-CM | POA: Diagnosis not present

## 2019-03-04 DIAGNOSIS — F329 Major depressive disorder, single episode, unspecified: Secondary | ICD-10-CM

## 2019-03-04 DIAGNOSIS — F32A Depression, unspecified: Secondary | ICD-10-CM

## 2019-03-04 DIAGNOSIS — E876 Hypokalemia: Secondary | ICD-10-CM

## 2019-03-04 LAB — GLUCOSE, POCT (MANUAL RESULT ENTRY): POC Glucose: 224 mg/dl — AB (ref 70–99)

## 2019-03-04 MED ORDER — MELATONIN 5 MG PO CAPS: 5.0000 mg | ORAL_CAPSULE | Freq: Every evening | ORAL | 3 refills | Status: DC | PRN

## 2019-03-04 NOTE — Progress Notes (Signed)
New Patient Office Visit  Subjective:  Patient ID: [name], female    DOB: 20-Nov-1955  Age: 63 y.o. MRN: [id number]  CC:  Chief Complaint  Patient presents with  . Hospitalization Follow-up    HHNC    HPI [name] presents for to establish care with a PCP. She is followed by cardiologist Dr. Criss Rosales for atrial fibrillation and diastolic congestive heart failure. Past medical history chronic diastolic congestive heart failure, COPD, super morbid obesity, sleep apnea, hypertension, hyperlipidemia, gastroesophageal reflux disease, diabetes type 2 atrial fibrillation on Pradaxa. She denies shortness of breath, or chest pain. She does have  lower extremity edema and headaches.  Past Medical History:  Diagnosis Date  . Allergic rhinitis   . Arthritis   . Asthma   . Chest pain 12/27/2017  . Chronic diastolic CHF (congestive heart failure) ([place])   . COPD (chronic obstructive pulmonary disease) ([place])   . Depression   . DM (diabetes mellitus) ([place])   . DVT (deep vein thrombosis) in pregnancy   . Dysrhythmia    atrial fibrilation  . GERD (gastroesophageal reflux disease)   . Headache(784.0)   . HTN (hypertension)   . Hyperlipidemia   . Obesity   . Pneumonia 04/2018   RIGHT LOBE  . Shortness of breath   . Sleep apnea    compliant with CPAP    Past Surgical History:  Procedure Laterality Date  . ABDOMINAL HYSTERECTOMY    . CATARACT EXTRACTION, BILATERAL    . CHOLECYSTECTOMY    . COLONOSCOPY WITH PROPOFOL N/A 03/05/2015   Procedure: COLONOSCOPY WITH PROPOFOL;  Surgeon: Carol Ada, MD;  Location: [facility];  Service: Endoscopy;  Laterality: N/A;  . DIAGNOSTIC LAPAROSCOPY    . ingrown hallux Left   . KNEE SURGERY    . LEFT HEART CATH AND CORONARY ANGIOGRAPHY N/A 12/31/2017   Procedure: LEFT HEART CATH AND CORONARY ANGIOGRAPHY;  Surgeon: Charolette Forward, MD;  Location: [facility];  Service: Cardiovascular;  Laterality: N/A;    Family History   Problem Relation Age of Onset  . Cancer Mother   . Hypertension Mother   . Cancer Father   . CAD Other   . Hypertension Sister   . Hypertension Brother     Social History   Socioeconomic History  . Marital status: Widowed    Spouse name: Not on file  . Number of children: Not on file  . Years of education: Not on file  . Highest education level: Not on file  Occupational History  . Not on file  Social Needs  . Financial resource strain: Not on file  . Food insecurity    Worry: Not on file    Inability: Not on file  . Transportation needs    Medical: Not on file    Non-medical: Not on file  Tobacco Use  . Smoking status: Never Smoker  . Smokeless tobacco: Never Used  Substance and Sexual Activity  . Alcohol use: No  . Drug use: No  . Sexual activity: Not on file  Lifestyle  . Physical activity    Days per week: Not on file    Minutes per session: Not on file  . Stress: Not on file  Relationships  . Social Herbalist on phone: Not on file    Gets together: Not on file    Attends religious service: Not on file    Active member of club or organization: Not on file  Attends meetings of clubs or organizations: Not on file    Relationship status: Not on file  . Intimate partner violence    Fear of current or ex partner: Not on file    Emotionally abused: Not on file    Physically abused: Not on file    Forced sexual activity: Not on file  Other Topics Concern  . Not on file  Social History Narrative   Pt lives in [place] with spouse.  2 grown children.   Previously worked in [employer] at [employer].  Now on disability    ROS Review of Systems  Constitutional: Positive for appetite change.       Increase  HENT: Negative.   Eyes: Negative.   Respiratory: Negative.   Cardiovascular: Positive for palpitations and leg swelling.  Gastrointestinal: Negative.   Endocrine: Positive for polyphagia.  Genitourinary: Negative.   Musculoskeletal: Positive  for arthralgias and back pain.       Bilateral knees  Skin: Negative.   Allergic/Immunologic: Positive for environmental allergies.  Neurological: Positive for headaches.  Hematological: Bruises/bleeds easily.  Psychiatric/Behavioral: Positive for agitation and sleep disturbance.    Objective:   Today's Vitals: BP 98/64 (BP Location: Left Arm, Patient Position: Sitting, Cuff Size: Large)   Pulse 87   Temp 98.4 F (36.9 C) (Tympanic)   Ht '5\' 7"'$  (1.702 m)   Wt (!) 307 lb 3.2 oz (139.3 kg)   SpO2 93%   BMI 48.11 kg/m   Physical Exam Vitals signs and nursing note reviewed.  Constitutional:      Appearance: She is obese.  HENT:     Head: Normocephalic.     Right Ear: Tympanic membrane normal.     Left Ear: Tympanic membrane normal.     Nose: Nose normal.     Mouth/Throat:     Mouth: Mucous membranes are moist.  Neck:     Musculoskeletal: Normal range of motion.  Cardiovascular:     Rate and Rhythm: Rhythm irregular.  Pulmonary:     Effort: Pulmonary effort is normal.     Breath sounds: Normal breath sounds.  Abdominal:     General: Bowel sounds are normal. There is distension.     Palpations: Abdomen is soft.  Musculoskeletal:        General: Tenderness present.     Right lower leg: Edema present.     Left lower leg: Edema present.  Skin:    General: Skin is warm and dry.  Neurological:     General: No focal deficit present.     Mental Status: She is alert and oriented to person, place, and time.  Psychiatric:        Mood and Affect: Mood normal.   Sensory exam of the foot is normal, tested with the monofilament. Good pulses, no lesions or ulcers, good peripheral pulses.  Assessment & Plan:    Outpatient Encounter Medications as of 03/04/2019  Medication Sig  . acetaminophen (TYLENOL) 325 MG tablet Take 2 tablets (650 mg total) by mouth every 6 (six) hours as needed for mild pain, moderate pain or headache.  . digoxin (LANOXIN) 0.125 MG tablet Take 1 tablet  (125 mcg total) by mouth daily.  . DULoxetine (CYMBALTA) 60 MG capsule Take 1 capsule (60 mg total) by mouth daily.  . furosemide (LASIX) 40 MG tablet Take 1 tablet (40 mg total) by mouth daily.  Marland Kitchen gabapentin (NEURONTIN) 300 MG capsule Take 300 mg by mouth 4 (four) times daily.   Marland Kitchen  insulin aspart (NOVOLOG) 100 UNIT/ML injection Inject 0-10 Units into the skin. PER SLIDING SCALE  . insulin detemir (LEVEMIR) 100 UNIT/ML injection Inject 0.2 mLs (20 Units total) into the skin 2 (two) times daily.  Marland Kitchen JANUVIA 50 MG tablet Take 50 mg by mouth daily.  . metoprolol tartrate (LOPRESSOR) 25 MG tablet Take 1 tablet (25 mg total) by mouth 2 (two) times daily.  . Multiple Vitamins-Minerals (MULTIVITAMIN WOMEN PO) Take 1 tablet by mouth daily.  . nitroGLYCERIN (NITROSTAT) 0.4 MG SL tablet Place 1 tablet (0.4 mg total) under the tongue every 5 (five) minutes x 3 doses as needed for chest pain.  . pantoprazole (PROTONIX) 40 MG tablet Take 1 tablet (40 mg total) by mouth 2 (two) times daily.  . potassium chloride SA (K-DUR,KLOR-CON) 20 MEQ tablet Take 1 tablet (20 mEq total) by mouth daily.  Marland Kitchen PRADAXA 75 MG CAPS capsule Take 75 mg by mouth 2 (two) times daily.  . [DISCONTINUED] PROAIR HFA 108 (90 BASE) MCG/ACT inhaler Inhale 2 puffs into the lungs every 4 (four) hours as needed for wheezing (wheezing).   . Blood Glucose Monitoring Suppl (ACCU-CHEK AVIVA PLUS) w/Device KIT   . insulin aspart (NOVOLOG) 100 UNIT/ML injection Inject 8 Units into the skin 3 (three) times daily with meals. Take only when your eat more than 50% of your meals  . Melatonin 5 MG CAPS Take 1 capsule (5 mg total) by mouth at bedtime as needed.  . RESTASIS 0.05 % ophthalmic emulsion Place 1 drop into both eyes 2 (two) times daily.   No facility-administered encounter medications on file as of 03/04/2019.   Waneda was seen today for hospitalization follow-up.  Diagnoses and all orders for this visit:  Need for Tdap vaccination -     Tdap  vaccine greater than or equal to 7yo IM  Type 2 diabetes mellitus without complication, without long-term current use of insulin ([place]) ADA recommends the following therapeutic goals for glycemic control related to A1c measurements: Goal of therapy: Less than 6.5 hemoglobin A1c.  Reference clinical practice recommendations. Foods that are high in carbohydrates are the following rice, potatoes, breads, sugars, and pastas.  Reduction in the intake (eating) will assist in lowering your blood sugars. -     Glucose (CBG) -     Microalbumin/Creatinine Ratio, Urine  Family history of breast cancer in mother -     MM Digital Diagnostic Bilat; Future  Establishing care with new doctor, encounter for Patient is being followed Dr. Terrence Dupont cardiologist and is in today to establish primary care.  Chronic atrial fibrillation Followed by cardiology  Dr. Terrence Dupont on Pradaxa  Morbid obesity ([place]) BMI 48 due to comorbidity it is extremely difficult to  Exercise except for her upper extremities.  She could benefit from from physical therapy for deconditioning . Re-evaluate and contact other provider involved in her care.  Lymphedema managed by the wound center by Dr. Linton Ham  Controlled type 2 diabetes mellitus with diabetic neuropathy, with long-term current use of insulin ([place]) Currently manage with gabapentin   Insomnia, unspecified type Treat with Melatonin 5 mg and re-evaluate effectiveness on follow up  Hypokalemia Reviewed labs potassium low added supplement  Chronic renal failure, stage 3 (moderate) ([place]) -     Ambulatory referral to Nephrology  Depression, unspecified depression type Previously on depression medication Cymbalta last prescribed 05/20/2018 with three refills.  Today she will be evaluated by clinical social worker  Other orders -     Melatonin 5  MG CAPS; Take 1 capsule (5 mg total) by mouth at bedtime as needed.  Follow-up: Return in about 3 months (around  06/04/2019) for uncontrollable diabetes .   [name], NP

## 2019-03-04 NOTE — BH Specialist Note (Signed)
Integrated Behavioral Health Initial Visit  MRN: [id number] Name: [name]  Number of [place] [facility] Clinician visits:: 1/6 Session Start time: 10:30 AM  Session End time: 11:00 AM Total time: 30 minutes  Type of Service: [place] Interpretor:No. Interpretor Name and Language: N/A   Warm Hand Off Completed.       SUBJECTIVE: [name] is a 63 y.o. female accompanied by self Patient was referred by NP Oletta Lamas for depression and anxiety. Patient reports the following symptoms/concerns: Pt reports difficulty managing physical and mental health. She complains of difficulty obtaining sleep, fatigue, decreased appetite, and irritability. Duration of problem: "long time"; Severity of problem: moderate  OBJECTIVE: Mood: Anxious and Affect: Appropriate Risk of harm to self or others: No plan to harm self or others  LIFE CONTEXT: Family and Social: Pt reports strong support system. She has a close relationship with adult children and siblings who reside locally School/Work: Pt has private insurance Self-Care: Pt participates in medication management. Requests medication to assist with sleep management Life Changes: Pt has difficulty managing chronic medical conditions.   STRENGTHS: Pt is participating in medication management Pt has a strong support system   GOALS ADDRESSED: Patient will: 1. Reduce symptoms of: agitation, anxiety and insomnia 2. Increase knowledge and/or ability of: coping skills and healthy habits  3. Demonstrate ability to: Increase healthy adjustment to current life circumstances and Increase adequate support systems for patient/family  INTERVENTIONS: Interventions utilized: Solution-Focused Strategies and Sleep Hygiene  Standardized Assessments completed: GAD-7 and PHQ 2&9  ASSESSMENT: Patient currently experiencing anxiety triggered by difficulty managing chronic medical conditions. She  complains of difficulty obtaining sleep, fatigue, decreased appetite, and irritability. Pt reports having a strong support system and identifying grand children as motivation to improve health. She denies suicidal or homicidal ideations.    Patient is currently participating in medication management; however, is requesting medication to assist with sleep. LCSW discussed strategies to improve sleep hygiene and decrease reported symptoms.    PLAN: 1. Follow up with behavioral health clinician on : Pt was encouraged to schedule follow up appointment with LCSW should symptoms increase or worsen 2. Behavioral recommendations: LCSW recommends pt utilize strategies discussed at session and continue with medication management. 3. Referral(s): [facility] (In Clinic) 4. "From scale of 1-10, how likely are you to follow plan?": 8  Rebekah Chesterfield, LCSW 03/06/2019 9:40 AM

## 2019-03-04 NOTE — Progress Notes (Signed)
Pt complains of knee, leg and back pain

## 2019-03-05 LAB — MICROALBUMIN / CREATININE URINE RATIO
Creatinine, Urine: 211.8 mg/dL
Microalb/Creat Ratio: 25 mg/g creat (ref 0–29)
Microalbumin, Urine: 53.1 ug/mL

## 2019-03-05 LAB — TSH: TSH: 4.21 u[IU]/mL (ref 0.450–4.500)

## 2019-03-10 ENCOUNTER — Other Ambulatory Visit: Payer: Self-pay

## 2019-03-10 ENCOUNTER — Ambulatory Visit: Payer: Medicare HMO | Attending: Family Medicine | Admitting: Pharmacist

## 2019-03-10 ENCOUNTER — Encounter: Payer: Self-pay | Admitting: Pharmacist

## 2019-03-10 ENCOUNTER — Ambulatory Visit (INDEPENDENT_AMBULATORY_CARE_PROVIDER_SITE_OTHER): Payer: Medicare HMO | Admitting: Podiatry

## 2019-03-10 ENCOUNTER — Ambulatory Visit: Payer: Medicare HMO | Admitting: Pharmacist

## 2019-03-10 ENCOUNTER — Encounter: Payer: Self-pay | Admitting: Podiatry

## 2019-03-10 VITALS — Temp 97.9°F

## 2019-03-10 DIAGNOSIS — M79674 Pain in right toe(s): Secondary | ICD-10-CM

## 2019-03-10 DIAGNOSIS — E1142 Type 2 diabetes mellitus with diabetic polyneuropathy: Secondary | ICD-10-CM

## 2019-03-10 DIAGNOSIS — M79675 Pain in left toe(s): Secondary | ICD-10-CM | POA: Diagnosis not present

## 2019-03-10 DIAGNOSIS — E1169 Type 2 diabetes mellitus with other specified complication: Secondary | ICD-10-CM

## 2019-03-10 DIAGNOSIS — E669 Obesity, unspecified: Secondary | ICD-10-CM | POA: Diagnosis not present

## 2019-03-10 DIAGNOSIS — G4733 Obstructive sleep apnea (adult) (pediatric): Secondary | ICD-10-CM | POA: Diagnosis not present

## 2019-03-10 DIAGNOSIS — E119 Type 2 diabetes mellitus without complications: Secondary | ICD-10-CM

## 2019-03-10 DIAGNOSIS — B351 Tinea unguium: Secondary | ICD-10-CM

## 2019-03-10 LAB — GLUCOSE, POCT (MANUAL RESULT ENTRY): POC Glucose: 199 mg/dl — AB (ref 70–99)

## 2019-03-10 MED ORDER — TRUEPLUS LANCETS 28G MISC: 11 refills | Status: DC

## 2019-03-10 MED ORDER — TRUE METRIX BLOOD GLUCOSE TEST VI STRP: ORAL_STRIP | 11 refills | Status: DC

## 2019-03-10 MED ORDER — ACCU-CHEK FASTCLIX LANCETS MISC: 12 refills | Status: DC

## 2019-03-10 MED ORDER — TRUE METRIX METER W/DEVICE KIT: PACK | 0 refills | Status: DC

## 2019-03-10 MED FILL — ACCU-CHEK GUIDE W/DEVICE KI: W/DEVICE | 30 days supply | Qty: 1 | Fill #0

## 2019-03-10 MED FILL — ACCU-CHEK FASTCLIX LANCETS: 30 days supply | Qty: 102 | Fill #0

## 2019-03-10 MED FILL — ACCU-CHEK GUIDE TEST STRIP: 30 days supply | Qty: 100 | Fill #0

## 2019-03-10 NOTE — Patient Instructions (Signed)
Thank you for coming to see me today. Please do the following:  1. Remember: pinch, stick, let go! 2. Inject your meal time insulin 15-20 minutes before eating.  3. Do not keep insulin in your car. 4. Continue checking blood sugars at home. 5. Continue making the lifestyle changes we've discussed together during our visit. Diet and exercise play a significant role in improving your blood sugars.  6. Follow-up with me in 2 weeks.   Hypoglycemia or low blood sugar:   Low blood sugar can happen quickly and may become an emergency if not treated right away.   While this shouldn't happen often, it can be brought upon if you skip a meal or do not eat enough. Also, if your insulin or other diabetes medications are dosed too high, this can cause your blood sugar to go to low.   Warning signs of low blood sugar include: 1. Feeling shaky or dizzy 2. Feeling weak or tired  3. Excessive hunger 4. Feeling anxious or upset  5. Sweating even when you aren't exercising  What to do if I experience low blood sugar? 1. Check your blood sugar with your meter. If lower than 70, proceed to step 2.  2. Treat with 3-4 glucose tablets or 3 packets of regular sugar. If these aren't around, you can try hard candy. Yet another option would be to drink 4 ounces of fruit juice or 6 ounces of REGULAR soda.  3. Re-check your sugar in 15 minutes. If it is still below 70, do what you did in step 2 again. If has come back up, go ahead and eat a snack or small meal at this time.

## 2019-03-10 NOTE — Patient Instructions (Signed)
Diabetes Mellitus and Foot Care Foot care is an important part of your health, especially when you have diabetes. Diabetes may cause you to have problems because of poor blood flow (circulation) to your feet and legs, which can cause your skin to:  Become thinner and drier.  Break more easily.  Heal more slowly.  Peel and crack. You may also have nerve damage (neuropathy) in your legs and feet, causing decreased feeling in them. This means that you may not notice minor injuries to your feet that could lead to more serious problems. Noticing and addressing any potential problems early is the best way to prevent future foot problems. How to care for your feet Foot hygiene  Wash your feet daily with warm water and mild soap. Do not use hot water. Then, pat your feet and the areas between your toes until they are completely dry. Do not soak your feet as this can dry your skin.  Trim your toenails straight across. Do not dig under them or around the cuticle. File the edges of your nails with an emery board or nail file.  Apply a moisturizing lotion or petroleum jelly to the skin on your feet and to dry, brittle toenails. Use lotion that does not contain alcohol and is unscented. Do not apply lotion between your toes. Shoes and socks  Wear clean socks or stockings every day. Make sure they are not too tight. Do not wear knee-high stockings since they may decrease blood flow to your legs.  Wear shoes that fit properly and have enough cushioning. Always look in your shoes before you put them on to be sure there are no objects inside.  To break in new shoes, wear them for just a few hours a day. This prevents injuries on your feet. Wounds, scrapes, corns, and calluses  Check your feet daily for blisters, cuts, bruises, sores, and redness. If you cannot see the bottom of your feet, use a mirror or ask someone for help.  Do not cut corns or calluses or try to remove them with medicine.  If you  find a minor scrape, cut, or break in the skin on your feet, keep it and the skin around it clean and dry. You may clean these areas with mild soap and water. Do not clean the area with peroxide, alcohol, or iodine.  If you have a wound, scrape, corn, or callus on your foot, look at it several times a day to make sure it is healing and not infected. Check for: ? Redness, swelling, or pain. ? Fluid or blood. ? Warmth. ? Pus or a bad smell. General instructions  Do not cross your legs. This may decrease blood flow to your feet.  Do not use heating pads or hot water bottles on your feet. They may burn your skin. If you have lost feeling in your feet or legs, you may not know this is happening until it is too late.  Protect your feet from hot and cold by wearing shoes, such as at the beach or on hot pavement.  Schedule a complete foot exam at least once a year (annually) or more often if you have foot problems. If you have foot problems, report any cuts, sores, or bruises to your health care provider immediately. Contact a health care provider if:  You have a medical condition that increases your risk of infection and you have any cuts, sores, or bruises on your feet.  You have an injury that is not   healing.  You have redness on your legs or feet.  You feel burning or tingling in your legs or feet.  You have pain or cramps in your legs and feet.  Your legs or feet are numb.  Your feet always feel cold.  You have pain around a toenail. Get help right away if:  You have a wound, scrape, corn, or callus on your foot and: ? You have pain, swelling, or redness that gets worse. ? You have fluid or blood coming from the wound, scrape, corn, or callus. ? Your wound, scrape, corn, or callus feels warm to the touch. ? You have pus or a bad smell coming from the wound, scrape, corn, or callus. ? You have a fever. ? You have a red line going up your leg. Summary  Check your feet every day  for cuts, sores, red spots, swelling, and blisters.  Moisturize feet and legs daily.  Wear shoes that fit properly and have enough cushioning.  If you have foot problems, report any cuts, sores, or bruises to your health care provider immediately.  Schedule a complete foot exam at least once a year (annually) or more often if you have foot problems. This information is not intended to replace advice given to you by your health care provider. Make sure you discuss any questions you have with your health care provider. Document Released: 08/04/2000 Document Revised: 09/19/2017 Document Reviewed: 09/08/2016 Elsevier Patient Education  2020 Elsevier Inc.  

## 2019-03-10 NOTE — Progress Notes (Signed)
    S:    PCP: [name]   No chief complaint on file.  Patient arrives in good spirits. Presents for diabetes evaluation, education, and management. Patient was referred and last seen by Primary Care Provider on 03/04/19.   Patient reports iabetes was diagnosed ~6-7 yrs ago.   Family/Social History:  - FHx: CAD, HTN (sister, brother) - Tobacco: never smoker - Alcohol: denies use  Insurance coverage/medication affordability: [employer]  Patient reports adherence with medications.  Current diabetes medications include: Levemir 20 units BID, Novolog 8 units TID + sliding scale (injects after eating), Januvia 50 mg daily Current hyperlipidemia medications include: none  Patient denies hypoglycemic events.  Patient reported dietary habits:  - Eats 3 to 4 meals a day - Breakfast: eggs w/ cheese, ham - Lunch: burger, fries, occasionally eats a salad, fruit - Dinner: green beans, cabbage, squash w/ zucchini and onions, baked chicken - Admits to drinking sweet tea, regular Cheerwine, cran-grape juice, and Gatorade    Patient-reported exercise habits:  - Walks 10 minutes every day   Patient denies nocturia.  Patient reports neuropathy (just saw the foot doctor today). Patient reports visual changes (sees Ophthalmologist Wednesday). Patient reports self foot exams.     O:  POCT glucose: 199  Gives home CBG range: 200-300s   Lab Results  Component Value Date   HGBA1C 13.8 (H) 02/22/2019   There were no vitals filed for this visit.  Lipid Panel     Component Value Date/Time   CHOL 106 12/30/2017 0436   TRIG 102 12/30/2017 0436   HDL 34 (L) 12/30/2017 0436   CHOLHDL 3.1 12/30/2017 0436   VLDL 20 12/30/2017 0436   LDLCALC 52 12/30/2017 0436   Clinical ASCVD: No  The ASCVD Risk score (Goff DC Jr., et al., 2013) failed to calculate for the following reasons:   The valid total cholesterol range is 130 to 320 mg/dL   Compelling indications: CHF, CKD  A/P: Diabetes  longstanding currently uncontrolled. Patient with symptoms of neuropathy. A1c > 11. Patient is able to verbalize appropriate hypoglycemia management plan. Patient is adherent with medication. Control is suboptimal due to dietary indiscretion, physical inactivity.  Patient does not inject mealtime insulin before meals. Additionally, her insulin pens are warm to touch and she reports keeping them in her car. Will have her obtain new pens. Proper injection technique emphasized with teach back. Will hold off on adjusting at this time. She is amenable to coming in a couple of weeks for reassessment.  With her CKD and CHF, pt may benefit from an SGLT-2 inhibitor eventually. However, her renal function is borderline. With her AKI hx, this may not be ideal.Would have to monitor closely.     -Continued current insulin regimen.  -Extensively counseled on injection technique. -Extensively discussed pathophysiology of DM, recommended lifestyle interventions, dietary effects on glycemic control -Counseled on s/sx of and management of hypoglycemia -Next A1C anticipated 02/22/19.   Written patient instructions provided.  Total time in face to face counseling 15 minutes.   Follow up Pharmacist Clinic Visit in 2 weeks.    Benard Halsted, PharmD, [place] [phone]

## 2019-03-12 DIAGNOSIS — N179 Acute kidney failure, unspecified: Secondary | ICD-10-CM | POA: Diagnosis not present

## 2019-03-12 DIAGNOSIS — H18413 Arcus senilis, bilateral: Secondary | ICD-10-CM | POA: Diagnosis not present

## 2019-03-12 DIAGNOSIS — I4819 Other persistent atrial fibrillation: Secondary | ICD-10-CM | POA: Diagnosis not present

## 2019-03-12 DIAGNOSIS — E1101 Type 2 diabetes mellitus with hyperosmolarity with coma: Secondary | ICD-10-CM | POA: Diagnosis not present

## 2019-03-12 DIAGNOSIS — E876 Hypokalemia: Secondary | ICD-10-CM | POA: Diagnosis not present

## 2019-03-12 DIAGNOSIS — H35373 Puckering of macula, bilateral: Secondary | ICD-10-CM | POA: Diagnosis not present

## 2019-03-12 DIAGNOSIS — H04123 Dry eye syndrome of bilateral lacrimal glands: Secondary | ICD-10-CM | POA: Diagnosis not present

## 2019-03-12 DIAGNOSIS — E119 Type 2 diabetes mellitus without complications: Secondary | ICD-10-CM | POA: Diagnosis not present

## 2019-03-12 DIAGNOSIS — J449 Chronic obstructive pulmonary disease, unspecified: Secondary | ICD-10-CM | POA: Diagnosis not present

## 2019-03-12 DIAGNOSIS — I130 Hypertensive heart and chronic kidney disease with heart failure and stage 1 through stage 4 chronic kidney disease, or unspecified chronic kidney disease: Secondary | ICD-10-CM | POA: Diagnosis not present

## 2019-03-12 DIAGNOSIS — H40023 Open angle with borderline findings, high risk, bilateral: Secondary | ICD-10-CM | POA: Diagnosis not present

## 2019-03-12 DIAGNOSIS — H11153 Pinguecula, bilateral: Secondary | ICD-10-CM | POA: Diagnosis not present

## 2019-03-12 DIAGNOSIS — H43813 Vitreous degeneration, bilateral: Secondary | ICD-10-CM | POA: Diagnosis not present

## 2019-03-12 DIAGNOSIS — I5032 Chronic diastolic (congestive) heart failure: Secondary | ICD-10-CM | POA: Diagnosis not present

## 2019-03-12 DIAGNOSIS — N183 Chronic kidney disease, stage 3 (moderate): Secondary | ICD-10-CM | POA: Diagnosis not present

## 2019-03-12 DIAGNOSIS — Z794 Long term (current) use of insulin: Secondary | ICD-10-CM | POA: Diagnosis not present

## 2019-03-12 DIAGNOSIS — E1122 Type 2 diabetes mellitus with diabetic chronic kidney disease: Secondary | ICD-10-CM | POA: Diagnosis not present

## 2019-03-13 DIAGNOSIS — I5032 Chronic diastolic (congestive) heart failure: Secondary | ICD-10-CM | POA: Diagnosis not present

## 2019-03-13 DIAGNOSIS — I4819 Other persistent atrial fibrillation: Secondary | ICD-10-CM | POA: Diagnosis not present

## 2019-03-13 DIAGNOSIS — N179 Acute kidney failure, unspecified: Secondary | ICD-10-CM | POA: Diagnosis not present

## 2019-03-13 DIAGNOSIS — N183 Chronic kidney disease, stage 3 (moderate): Secondary | ICD-10-CM | POA: Diagnosis not present

## 2019-03-13 DIAGNOSIS — I130 Hypertensive heart and chronic kidney disease with heart failure and stage 1 through stage 4 chronic kidney disease, or unspecified chronic kidney disease: Secondary | ICD-10-CM | POA: Diagnosis not present

## 2019-03-13 DIAGNOSIS — E1122 Type 2 diabetes mellitus with diabetic chronic kidney disease: Secondary | ICD-10-CM | POA: Diagnosis not present

## 2019-03-13 DIAGNOSIS — E876 Hypokalemia: Secondary | ICD-10-CM | POA: Diagnosis not present

## 2019-03-13 DIAGNOSIS — E1101 Type 2 diabetes mellitus with hyperosmolarity with coma: Secondary | ICD-10-CM | POA: Diagnosis not present

## 2019-03-13 DIAGNOSIS — J449 Chronic obstructive pulmonary disease, unspecified: Secondary | ICD-10-CM | POA: Diagnosis not present

## 2019-03-13 NOTE — Progress Notes (Signed)
Subjective: [name] presents today with history of neuropathy. Patient seen for follow up of chronic, painful mycotic toenails which interfere with daily activities and routine tasks.  Pain is aggravated when wearing enclosed shoe gear. Pain is getting progressively worse and relieved with periodic professional debridement.   [name], NP is her PCP.    Current Outpatient Medications:  .  Alcohol Swabs (B-D SINGLE USE SWABS REGULAR) PADS, , Disp: , Rfl:  .  digoxin (LANOXIN) 0.125 MG tablet, Take 1 tablet (125 mcg total) by mouth daily., Disp: 30 tablet, Rfl: 11 .  DULoxetine (CYMBALTA) 60 MG capsule, Take 1 capsule (60 mg total) by mouth daily., Disp: 30 capsule, Rfl: 3 .  furosemide (LASIX) 40 MG tablet, Take 1 tablet (40 mg total) by mouth daily., Disp: 60 tablet, Rfl: 11 .  gabapentin (NEURONTIN) 300 MG capsule, Take 300 mg by mouth 4 (four) times daily. , Disp: , Rfl:  .  insulin aspart (NOVOLOG) 100 UNIT/ML injection, Inject 0-10 Units into the skin. PER SLIDING SCALE, Disp: , Rfl:  .  insulin aspart (NOVOLOG) 100 UNIT/ML injection, Inject 8 Units into the skin 3 (three) times daily with meals. Take only when your eat more than 50% of your meals, Disp: 10 mL, Rfl: 0 .  insulin detemir (LEVEMIR) 100 UNIT/ML injection, Inject 0.2 mLs (20 Units total) into the skin 2 (two) times daily., Disp: 10 mL, Rfl: 11 .  JANUVIA 50 MG tablet, Take 50 mg by mouth daily., Disp: , Rfl:  .  Melatonin 5 MG CAPS, Take 1 capsule (5 mg total) by mouth at bedtime as needed., Disp: 30 capsule, Rfl: 3 .  metoprolol tartrate (LOPRESSOR) 25 MG tablet, Take 1 tablet (25 mg total) by mouth 2 (two) times daily., Disp: 60 tablet, Rfl: 0 .  Multiple Vitamins-Minerals (MULTIVITAMIN WOMEN PO), Take 1 tablet by mouth daily., Disp: , Rfl:  .  nitroGLYCERIN (NITROSTAT) 0.4 MG SL tablet, Place 1 tablet (0.4 mg total) under the tongue every 5 (five) minutes x 3 doses as needed for chest pain., Disp: 25  tablet, Rfl: 12 .  pantoprazole (PROTONIX) 40 MG tablet, Take 1 tablet (40 mg total) by mouth 2 (two) times daily., Disp: 60 tablet, Rfl: 0 .  potassium chloride SA (K-DUR,KLOR-CON) 20 MEQ tablet, Take 1 tablet (20 mEq total) by mouth daily., Disp: 30 tablet, Rfl: 3 .  PRADAXA 75 MG CAPS capsule, Take 75 mg by mouth 2 (two) times daily., Disp: , Rfl:  .  RESTASIS 0.05 % ophthalmic emulsion, Place 1 drop into both eyes 2 (two) times daily., Disp: , Rfl: 12 .  Accu-Chek FastClix Lancets MISC, Use as directed three times daily, Disp: 102 each, Rfl: 12 .  Blood Glucose Monitoring Suppl (TRUE METRIX METER) w/Device KIT, Use to check blood sugar daily., Disp: 1 kit, Rfl: 0 .  glucose blood (TRUE METRIX BLOOD GLUCOSE TEST) test strip, Use as instructed to check blood sugar daily., Disp: 100 each, Rfl: 11  Allergies  Allergen Reactions  . Sulfa Antibiotics Itching    Objective: Vitals:   03/10/19 0931  Temp: 97.9 F (36.6 C)    Vascular Examination: Capillary refill time <3 seconds x 10 digits.  Dorsalis pedis pulses present b/l.  Posterior tibial pulses faintly palpable b/l.  No digital hair x 10 digits.  Skin temperature WNL b/l.  Chronic venous insufficiency with chronic pedal edema b/l ankles/feet. No open wounds. No pain with calf compression.   Dermatological Examination: Skin warm and  dry b/l.  Toenails 1-5 b/l discolored, thick, dystrophic with subungual debris and pain with palpation to nailbeds due to thickness of nails.  Musculoskeletal: Muscle strength 5/5 b/l to all LE muscle groups.  Neurological: Sensation decreased with 10 gram monofilament.  Vibratory sensation diminished b/l.  Assessment: 1. Painful onychomycosis toenails 1-5 b/l 2. NIDDM with peripheral neuropathy  Plan: 1. Toenails 1-5 b/l were debrided in length and girth without iatrogenic bleeding. 2. Calluses pared submetatarsal head(s) utilizing sterile scalpel blade without incident. Corn(s)  pared utilizing sterile scalpel blade without incident.  3. Patient to continue soft, supportive shoe gear daily. 4. Patient to report any pedal injuries to medical professional immediately. 5. Follow up 3 months.  6. Patient/POA to call should there be a concern in the interim.

## 2019-03-17 DIAGNOSIS — E1101 Type 2 diabetes mellitus with hyperosmolarity with coma: Secondary | ICD-10-CM | POA: Diagnosis not present

## 2019-03-17 DIAGNOSIS — E1122 Type 2 diabetes mellitus with diabetic chronic kidney disease: Secondary | ICD-10-CM | POA: Diagnosis not present

## 2019-03-17 DIAGNOSIS — I5032 Chronic diastolic (congestive) heart failure: Secondary | ICD-10-CM | POA: Diagnosis not present

## 2019-03-17 DIAGNOSIS — E876 Hypokalemia: Secondary | ICD-10-CM | POA: Diagnosis not present

## 2019-03-17 DIAGNOSIS — I130 Hypertensive heart and chronic kidney disease with heart failure and stage 1 through stage 4 chronic kidney disease, or unspecified chronic kidney disease: Secondary | ICD-10-CM | POA: Diagnosis not present

## 2019-03-17 DIAGNOSIS — I4819 Other persistent atrial fibrillation: Secondary | ICD-10-CM | POA: Diagnosis not present

## 2019-03-17 DIAGNOSIS — N183 Chronic kidney disease, stage 3 (moderate): Secondary | ICD-10-CM | POA: Diagnosis not present

## 2019-03-17 DIAGNOSIS — J449 Chronic obstructive pulmonary disease, unspecified: Secondary | ICD-10-CM | POA: Diagnosis not present

## 2019-03-17 DIAGNOSIS — N179 Acute kidney failure, unspecified: Secondary | ICD-10-CM | POA: Diagnosis not present

## 2019-03-20 DIAGNOSIS — I4819 Other persistent atrial fibrillation: Secondary | ICD-10-CM | POA: Diagnosis not present

## 2019-03-20 DIAGNOSIS — E1122 Type 2 diabetes mellitus with diabetic chronic kidney disease: Secondary | ICD-10-CM | POA: Diagnosis not present

## 2019-03-20 DIAGNOSIS — J449 Chronic obstructive pulmonary disease, unspecified: Secondary | ICD-10-CM | POA: Diagnosis not present

## 2019-03-20 DIAGNOSIS — I5032 Chronic diastolic (congestive) heart failure: Secondary | ICD-10-CM | POA: Diagnosis not present

## 2019-03-20 DIAGNOSIS — N179 Acute kidney failure, unspecified: Secondary | ICD-10-CM | POA: Diagnosis not present

## 2019-03-20 DIAGNOSIS — N183 Chronic kidney disease, stage 3 (moderate): Secondary | ICD-10-CM | POA: Diagnosis not present

## 2019-03-20 DIAGNOSIS — E1101 Type 2 diabetes mellitus with hyperosmolarity with coma: Secondary | ICD-10-CM | POA: Diagnosis not present

## 2019-03-20 DIAGNOSIS — I130 Hypertensive heart and chronic kidney disease with heart failure and stage 1 through stage 4 chronic kidney disease, or unspecified chronic kidney disease: Secondary | ICD-10-CM | POA: Diagnosis not present

## 2019-03-20 DIAGNOSIS — E876 Hypokalemia: Secondary | ICD-10-CM | POA: Diagnosis not present

## 2019-03-21 DIAGNOSIS — H524 Presbyopia: Secondary | ICD-10-CM | POA: Diagnosis not present

## 2019-03-24 ENCOUNTER — Ambulatory Visit: Payer: Medicare HMO | Attending: Family Medicine | Admitting: Pharmacist

## 2019-03-24 ENCOUNTER — Encounter: Payer: Self-pay | Admitting: Pharmacist

## 2019-03-24 ENCOUNTER — Other Ambulatory Visit: Payer: Self-pay

## 2019-03-24 DIAGNOSIS — E1169 Type 2 diabetes mellitus with other specified complication: Secondary | ICD-10-CM | POA: Diagnosis not present

## 2019-03-24 DIAGNOSIS — E669 Obesity, unspecified: Secondary | ICD-10-CM

## 2019-03-24 LAB — GLUCOSE, POCT (MANUAL RESULT ENTRY): POC Glucose: 201 mg/dl — AB (ref 70–99)

## 2019-03-24 MED ORDER — INSULIN ASPART 100 UNIT/ML ~~LOC~~ SOLN: 8.0000 [IU] | Freq: Three times a day (TID) | SUBCUTANEOUS | 0 refills | Status: DC

## 2019-03-24 MED ORDER — "INSULIN SYRINGE-NEEDLE U-100 31G X 5/16"" 0.3 ML MISC": 11 refills | Status: DC

## 2019-03-24 MED FILL — TRUEPLUS SYR 0.3ML 31GX5/16: 31G X 5/16" | 30 days supply | Qty: 100 | Fill #0

## 2019-03-24 NOTE — Patient Instructions (Signed)
Thank you for coming to see me today. Please do the following:  1. Increase Levemir to 24 units twice a day (in the morning and before bedtime). 2. Continue Novolog.  3. Continue checking blood sugars at home.  4. Continue making the lifestyle changes we've discussed together during our visit. Diet and exercise play a significant role in improving your blood sugars.  5. Follow-up with me in 3 weeks.    Hypoglycemia or low blood sugar:   Low blood sugar can happen quickly and may become an emergency if not treated right away.   While this shouldn't happen often, it can be brought upon if you skip a meal or do not eat enough. Also, if your insulin or other diabetes medications are dosed too high, this can cause your blood sugar to go to low.   Warning signs of low blood sugar include: 1. Feeling shaky or dizzy 2. Feeling weak or tired  3. Excessive hunger 4. Feeling anxious or upset  5. Sweating even when you aren't exercising  What to do if I experience low blood sugar? 1. Check your blood sugar with your meter. If lower than 70, proceed to step 2.  2. Treat with 3-4 glucose tablets or 3 packets of regular sugar. If these aren't around, you can try hard candy. Yet another option would be to drink 4 ounces of fruit juice or 6 ounces of REGULAR soda.  3. Re-check your sugar in 15 minutes. If it is still below 70, do what you did in step 2 again. If has come back up, go ahead and eat a snack or small meal at this time.

## 2019-03-24 NOTE — Progress Notes (Signed)
    S:    PCP: [name]   No chief complaint on file.  Patient arrives in good spirits. Presents for diabetes evaluation, education, and management. Patient was referred and last seen by Primary Care Provider on 03/04/19. I saw her on 7/20 and provided insulin injection technique counseling and DM education.   Patient reports iabetes was diagnosed ~6-7 yrs ago.   Family/Social History:  - FHx: CAD, HTN (sister, brother) - Tobacco: never smoker - Alcohol: denies use  Insurance coverage/medication affordability: [employer]  Patient reports adherence with medications.  Current diabetes medications include: Levemir 20 units BID, Novolog 8 units TID + sliding scale, Januvia 50 mg daily Current hyperlipidemia medications include: none  Patient denies hypoglycemic events.  Patient reported dietary habits:  - Eats 3 to 4 meals a day - Breakfast: eggs w/ cheese, ham - Lunch: burger, fries, occasionally eats a salad, fruit - Dinner: green beans, cabbage, squash w/ zucchini and onions, baked chicken - Admits to drinking sweet tea, regular Cheerwine, cran-grape juice, and Gatorade    Patient-reported exercise habits:  - Walks 10 minutes every day   Patient denies nocturia.  Patient reports neuropathy  Patient reports visual changes Patient reports self foot exams.     O:  POCT glucose: 201 (checked this morning and it was 179)  Gives home CBG range since seeing me:  Patient brings in a log Fasting CBGs: 125-198 (2 outliers of 208, 228) Post-prandial/random CBGs: 152 - 192 (2 outliers 206, 227)  Lab Results  Component Value Date   HGBA1C 13.8 (H) 02/22/2019   There were no vitals filed for this visit.  Lipid Panel     Component Value Date/Time   CHOL 106 12/30/2017 0436   TRIG 102 12/30/2017 0436   HDL 34 (L) 12/30/2017 0436   CHOLHDL 3.1 12/30/2017 0436   VLDL 20 12/30/2017 0436   LDLCALC 52 12/30/2017 0436   Clinical ASCVD: No  The ASCVD Risk score (Goff DC Jr., et  al., 2013) failed to calculate for the following reasons:   The valid total cholesterol range is 130 to 320 mg/dL   Compelling indications: CHF, CKD  A/P: Diabetes longstanding currently uncontrolled.  Patient is able to verbalize appropriate hypoglycemia management plan. Patient is adherent with medication. Control is suboptimal due to dietary indiscretion, physical inactivity.  Since last visit, patient's home glucose levels have improved but are still above goal. Recommend to increase Levemir to 24 units BID. Adding other medications at this point is difficult as patient's renal function is poor. Will defer addition of any medication to PCP.   -Increase Levemir to 24 BID -Continue sliding scale Novolog -Extensively discussed pathophysiology of DM, recommended lifestyle interventions, dietary effects on glycemic control -Counseled on s/sx of and management of hypoglycemia -Next A1C anticipated 05/2019.   Written patient instructions provided.  Total time in face to face counseling 15 minutes.   Follow up Pharmacist Clinic Visit in 3 weeks.    Benard Halsted, PharmD, [place] [phone]

## 2019-03-26 DIAGNOSIS — N183 Chronic kidney disease, stage 3 (moderate): Secondary | ICD-10-CM | POA: Diagnosis not present

## 2019-03-26 DIAGNOSIS — I4819 Other persistent atrial fibrillation: Secondary | ICD-10-CM | POA: Diagnosis not present

## 2019-03-26 DIAGNOSIS — J449 Chronic obstructive pulmonary disease, unspecified: Secondary | ICD-10-CM | POA: Diagnosis not present

## 2019-03-26 DIAGNOSIS — I130 Hypertensive heart and chronic kidney disease with heart failure and stage 1 through stage 4 chronic kidney disease, or unspecified chronic kidney disease: Secondary | ICD-10-CM | POA: Diagnosis not present

## 2019-03-26 DIAGNOSIS — E876 Hypokalemia: Secondary | ICD-10-CM | POA: Diagnosis not present

## 2019-03-26 DIAGNOSIS — E1101 Type 2 diabetes mellitus with hyperosmolarity with coma: Secondary | ICD-10-CM | POA: Diagnosis not present

## 2019-03-26 DIAGNOSIS — N179 Acute kidney failure, unspecified: Secondary | ICD-10-CM | POA: Diagnosis not present

## 2019-03-26 DIAGNOSIS — I5032 Chronic diastolic (congestive) heart failure: Secondary | ICD-10-CM | POA: Diagnosis not present

## 2019-03-26 DIAGNOSIS — E1122 Type 2 diabetes mellitus with diabetic chronic kidney disease: Secondary | ICD-10-CM | POA: Diagnosis not present

## 2019-04-01 ENCOUNTER — Telehealth: Payer: Self-pay | Admitting: Primary Care

## 2019-04-04 DIAGNOSIS — N179 Acute kidney failure, unspecified: Secondary | ICD-10-CM | POA: Diagnosis not present

## 2019-04-04 DIAGNOSIS — I5032 Chronic diastolic (congestive) heart failure: Secondary | ICD-10-CM | POA: Diagnosis not present

## 2019-04-04 DIAGNOSIS — E876 Hypokalemia: Secondary | ICD-10-CM | POA: Diagnosis not present

## 2019-04-04 DIAGNOSIS — I130 Hypertensive heart and chronic kidney disease with heart failure and stage 1 through stage 4 chronic kidney disease, or unspecified chronic kidney disease: Secondary | ICD-10-CM | POA: Diagnosis not present

## 2019-04-04 DIAGNOSIS — J449 Chronic obstructive pulmonary disease, unspecified: Secondary | ICD-10-CM | POA: Diagnosis not present

## 2019-04-04 DIAGNOSIS — E1122 Type 2 diabetes mellitus with diabetic chronic kidney disease: Secondary | ICD-10-CM | POA: Diagnosis not present

## 2019-04-04 DIAGNOSIS — E1101 Type 2 diabetes mellitus with hyperosmolarity with coma: Secondary | ICD-10-CM | POA: Diagnosis not present

## 2019-04-04 DIAGNOSIS — I4819 Other persistent atrial fibrillation: Secondary | ICD-10-CM | POA: Diagnosis not present

## 2019-04-04 DIAGNOSIS — N183 Chronic kidney disease, stage 3 (moderate): Secondary | ICD-10-CM | POA: Diagnosis not present

## 2019-04-04 MED FILL — ACCU-CHEK FASTCLIX LANCETS: 30 days supply | Qty: 102 | Fill #1

## 2019-04-04 MED FILL — ACCU-CHEK GUIDE TEST STRIP: 30 days supply | Qty: 100 | Fill #1

## 2019-04-14 ENCOUNTER — Ambulatory Visit: Payer: Medicare HMO | Admitting: Pharmacist

## 2019-04-14 DIAGNOSIS — I5032 Chronic diastolic (congestive) heart failure: Secondary | ICD-10-CM | POA: Diagnosis not present

## 2019-04-14 DIAGNOSIS — N183 Chronic kidney disease, stage 3 (moderate): Secondary | ICD-10-CM | POA: Diagnosis not present

## 2019-04-14 DIAGNOSIS — I4819 Other persistent atrial fibrillation: Secondary | ICD-10-CM | POA: Diagnosis not present

## 2019-04-14 DIAGNOSIS — I130 Hypertensive heart and chronic kidney disease with heart failure and stage 1 through stage 4 chronic kidney disease, or unspecified chronic kidney disease: Secondary | ICD-10-CM | POA: Diagnosis not present

## 2019-04-14 DIAGNOSIS — E876 Hypokalemia: Secondary | ICD-10-CM | POA: Diagnosis not present

## 2019-04-14 DIAGNOSIS — N179 Acute kidney failure, unspecified: Secondary | ICD-10-CM | POA: Diagnosis not present

## 2019-04-14 DIAGNOSIS — E1122 Type 2 diabetes mellitus with diabetic chronic kidney disease: Secondary | ICD-10-CM | POA: Diagnosis not present

## 2019-04-14 DIAGNOSIS — J449 Chronic obstructive pulmonary disease, unspecified: Secondary | ICD-10-CM | POA: Diagnosis not present

## 2019-04-14 DIAGNOSIS — E1101 Type 2 diabetes mellitus with hyperosmolarity with coma: Secondary | ICD-10-CM | POA: Diagnosis not present

## 2019-04-14 NOTE — Progress Notes (Deleted)
    S:    PCP: [name]   No chief complaint on file.  Patient arrives in good spirits. Presents for diabetes f/u. Patient was referred and last seen by Primary Care Provider on 03/04/19. I saw her on 8/3 and adjusted her insulin.  Patient reports iabetes was diagnosed ~6-7 yrs ago.   Family/Social History:  - FHx: CAD, HTN (sister, brother) - Tobacco: never smoker - Alcohol: denies use  Insurance coverage/medication affordability: [employer]  Patient *** adherence with medications.  Current diabetes medications include: Levemir 24 units BID, Novolog 8 units TID + sliding scale, Januvia 50 mg daily Current hyperlipidemia medications include: none  Patient *** hypoglycemic events.  Patient reported dietary habits:  - ***  Patient-reported exercise habits:  - ***   Patient *** nocturia.  Patient *** neuropathy. Patient *** visual changes. Patient **** self foot exams.     O:  POCT glucose: ***  Gives home CBG range since seeing me:  Patient brings in a log Fasting CBGs: *** Post-prandial/random CBGs: ***  Lab Results  Component Value Date   HGBA1C 13.8 (H) 02/22/2019   There were no vitals filed for this visit.  Lipid Panel     Component Value Date/Time   CHOL 106 12/30/2017 0436   TRIG 102 12/30/2017 0436   HDL 34 (L) 12/30/2017 0436   CHOLHDL 3.1 12/30/2017 0436   VLDL 20 12/30/2017 0436   LDLCALC 52 12/30/2017 0436   Clinical ASCVD: No  The ASCVD Risk score (Goff DC Jr., et al., 2013) failed to calculate for the following reasons:   The valid total cholesterol range is 130 to 320 mg/dL   Compelling indications: CHF, CKD  A/P: Diabetes longstanding currently uncontrolled.  Patient is able to verbalize appropriate hypoglycemia management plan. Patient is adherent with medication. Control is suboptimal due to dietary indiscretion, physical inactivity.  Since last visit, patient's home glucose levels have improved but are still above goal. Recommend to  increase Levemir to 24 units BID. Adding other medications at this point is difficult as patient's renal function is poor. Will defer addition of any medication to PCP.   -Increase Levemir to 24 BID -Continue sliding scale Novolog -Extensively discussed pathophysiology of DM, recommended lifestyle interventions, dietary effects on glycemic control -Counseled on s/sx of and management of hypoglycemia -Next A1C anticipated 05/2019.  -Lipid; statin   HM: UTD w/ PNA and tetanus   Written patient instructions provided.  Total time in face to face counseling 15 minutes.   Follow up Pharmacist Clinic Visit in 3 weeks.    Benard Halsted, PharmD, [place] [phone]

## 2019-04-15 ENCOUNTER — Other Ambulatory Visit: Payer: Self-pay | Admitting: Family Medicine

## 2019-04-17 DIAGNOSIS — E1101 Type 2 diabetes mellitus with hyperosmolarity with coma: Secondary | ICD-10-CM | POA: Diagnosis not present

## 2019-04-17 DIAGNOSIS — J449 Chronic obstructive pulmonary disease, unspecified: Secondary | ICD-10-CM | POA: Diagnosis not present

## 2019-04-17 DIAGNOSIS — E1122 Type 2 diabetes mellitus with diabetic chronic kidney disease: Secondary | ICD-10-CM | POA: Diagnosis not present

## 2019-04-17 DIAGNOSIS — I130 Hypertensive heart and chronic kidney disease with heart failure and stage 1 through stage 4 chronic kidney disease, or unspecified chronic kidney disease: Secondary | ICD-10-CM | POA: Diagnosis not present

## 2019-04-17 DIAGNOSIS — I5032 Chronic diastolic (congestive) heart failure: Secondary | ICD-10-CM | POA: Diagnosis not present

## 2019-04-17 DIAGNOSIS — N179 Acute kidney failure, unspecified: Secondary | ICD-10-CM | POA: Diagnosis not present

## 2019-04-17 DIAGNOSIS — E876 Hypokalemia: Secondary | ICD-10-CM | POA: Diagnosis not present

## 2019-04-17 DIAGNOSIS — I4819 Other persistent atrial fibrillation: Secondary | ICD-10-CM | POA: Diagnosis not present

## 2019-04-17 DIAGNOSIS — N183 Chronic kidney disease, stage 3 (moderate): Secondary | ICD-10-CM | POA: Diagnosis not present

## 2019-04-21 ENCOUNTER — Other Ambulatory Visit: Payer: Self-pay

## 2019-04-21 ENCOUNTER — Ambulatory Visit: Payer: Medicare HMO | Attending: Primary Care | Admitting: Pharmacist

## 2019-04-21 DIAGNOSIS — E1165 Type 2 diabetes mellitus with hyperglycemia: Secondary | ICD-10-CM

## 2019-04-21 DIAGNOSIS — E669 Obesity, unspecified: Secondary | ICD-10-CM

## 2019-04-21 DIAGNOSIS — E1169 Type 2 diabetes mellitus with other specified complication: Secondary | ICD-10-CM

## 2019-04-21 LAB — GLUCOSE, POCT (MANUAL RESULT ENTRY): POC Glucose: 278 mg/dl — AB (ref 70–99)

## 2019-04-21 NOTE — Patient Instructions (Signed)
Thank you for coming to see me today. Please do the following:  1. Increase Levemir to 30 units twice daily.  2. Take Novolog 5 units three times a day 15 minutes before meals.  3. Continue checking blood sugars at home. .  4. Continue making the lifestyle changes we've discussed together during our visit. Diet and exercise play a significant role in improving your blood sugars.  5. Follow-up with me in 3 weeks.    Hypoglycemia or low blood sugar:   Low blood sugar can happen quickly and may become an emergency if not treated right away.   While this shouldn't happen often, it can be brought upon if you skip a meal or do not eat enough. Also, if your insulin or other diabetes medications are dosed too high, this can cause your blood sugar to go to low.   Warning signs of low blood sugar include: 1. Feeling shaky or dizzy 2. Feeling weak or tired  3. Excessive hunger 4. Feeling anxious or upset  5. Sweating even when you aren't exercising  What to do if I experience low blood sugar? 1. Check your blood sugar with your meter. If lower than 70, proceed to step 2.  2. Treat with 3-4 glucose tablets or 3 packets of regular sugar. If these aren't around, you can try hard candy. Yet another option would be to drink 4 ounces of fruit juice or 6 ounces of REGULAR soda.  3. Re-check your sugar in 15 minutes. If it is still below 70, do what you did in step 2 again. If has come back up, go ahead and eat a snack or small meal at this time.

## 2019-04-21 NOTE — Progress Notes (Signed)
    S:    PCP: [name]   No chief complaint on file.  Patient arrives in good spirits. Presents for diabetes f/u. Patient was referred and last seen by Primary Care Provider on 03/04/19. I saw her on 8/3 and adjusted her insulin.  Patient reports iabetes was diagnosed ~6-7 yrs ago.   Family/Social History:  - FHx: CAD, HTN (sister, brother) - Tobacco: never smoker - Alcohol: denies use  Insurance coverage/medication affordability: [employer]  Patient reports adherence with medications.  Current diabetes medications include: Levemir 24 units BID, Novolog 8 units TID + sliding scale, Januvia 50 mg daily Current hyperlipidemia medications include: none  Patient denies hypoglycemic events.  Patient reported dietary habits:  - non adherence  Patient-reported exercise habits:  - none    Patient denies nocturia.  Patient reports baseline neuropathy. Patient reports baseline visual changes. Patient reports self foot exams.     O:  POCT glucose: 278  Gives home CBG range since seeing me:  Patient brings in a log Fasting CBGs: mostly 200s Post-prandial/random CBGs: wide range of 200 - 500   Lab Results  Component Value Date   HGBA1C 13.8 (H) 02/22/2019   There were no vitals filed for this visit.  Lipid Panel     Component Value Date/Time   CHOL 106 12/30/2017 0436   TRIG 102 12/30/2017 0436   HDL 34 (L) 12/30/2017 0436   CHOLHDL 3.1 12/30/2017 0436   VLDL 20 12/30/2017 0436   LDLCALC 52 12/30/2017 0436   Clinical ASCVD: No  The ASCVD Risk score (Goff DC Jr., et al., 2013) failed to calculate for the following reasons:   The valid total cholesterol range is 130 to 320 mg/dL   Compelling indications: CHF, CKD  A/P: Diabetes longstanding currently uncontrolled.  Patient is able to verbalize appropriate hypoglycemia management plan. Patient is adherent with medication. Control is suboptimal due to dietary indiscretion, physical inactivity.  -Increase Levemir to 30  BID -Novolog 5 units TID before meals -Extensively discussed pathophysiology of DM, recommended lifestyle interventions, dietary effects on glycemic control -Counseled on s/sx of and management of hypoglycemia -Next A1C anticipated 05/2019.  -Lipid; deferred - next visit  HM: UTD w/ PNA and tetanus   Written patient instructions provided.  Total time in face to face counseling 15 minutes.   Follow up Pharmacist Clinic Visit in 3 weeks.    Benard Halsted, PharmD, [place] [phone]

## 2019-04-23 DIAGNOSIS — R69 Illness, unspecified: Secondary | ICD-10-CM | POA: Diagnosis not present

## 2019-04-24 DIAGNOSIS — E1101 Type 2 diabetes mellitus with hyperosmolarity with coma: Secondary | ICD-10-CM | POA: Diagnosis not present

## 2019-04-24 DIAGNOSIS — N179 Acute kidney failure, unspecified: Secondary | ICD-10-CM | POA: Diagnosis not present

## 2019-04-24 DIAGNOSIS — E1122 Type 2 diabetes mellitus with diabetic chronic kidney disease: Secondary | ICD-10-CM | POA: Diagnosis not present

## 2019-04-24 DIAGNOSIS — J449 Chronic obstructive pulmonary disease, unspecified: Secondary | ICD-10-CM | POA: Diagnosis not present

## 2019-04-24 DIAGNOSIS — N183 Chronic kidney disease, stage 3 (moderate): Secondary | ICD-10-CM | POA: Diagnosis not present

## 2019-04-24 DIAGNOSIS — E876 Hypokalemia: Secondary | ICD-10-CM | POA: Diagnosis not present

## 2019-04-24 DIAGNOSIS — I130 Hypertensive heart and chronic kidney disease with heart failure and stage 1 through stage 4 chronic kidney disease, or unspecified chronic kidney disease: Secondary | ICD-10-CM | POA: Diagnosis not present

## 2019-04-24 DIAGNOSIS — I4819 Other persistent atrial fibrillation: Secondary | ICD-10-CM | POA: Diagnosis not present

## 2019-04-24 DIAGNOSIS — I5032 Chronic diastolic (congestive) heart failure: Secondary | ICD-10-CM | POA: Diagnosis not present

## 2019-05-10 ENCOUNTER — Other Ambulatory Visit: Payer: Self-pay | Admitting: Family Medicine

## 2019-05-12 ENCOUNTER — Ambulatory Visit: Payer: Medicare HMO | Admitting: Pharmacist

## 2019-05-14 ENCOUNTER — Ambulatory Visit: Payer: Medicare HMO | Attending: Family Medicine | Admitting: Pharmacist

## 2019-05-14 ENCOUNTER — Other Ambulatory Visit: Payer: Self-pay

## 2019-05-14 DIAGNOSIS — E1169 Type 2 diabetes mellitus with other specified complication: Secondary | ICD-10-CM

## 2019-05-14 DIAGNOSIS — E669 Obesity, unspecified: Secondary | ICD-10-CM

## 2019-05-14 DIAGNOSIS — Z23 Encounter for immunization: Secondary | ICD-10-CM

## 2019-05-14 LAB — GLUCOSE, POCT (MANUAL RESULT ENTRY): POC Glucose: 324 mg/dl — AB (ref 70–99)

## 2019-05-14 MED ORDER — TRULICITY 0.75 MG/0.5ML ~~LOC~~ SOAJ: 0.7500 mg | SUBCUTANEOUS | 2 refills | Status: DC

## 2019-05-14 NOTE — Patient Instructions (Addendum)
Thank you for coming to see me today. Please do the following:  1. Increase Levemir to 40 units twice daily.  2. Continue Novolog 5 units before meals.  3. Start taking Trulicty. Inject into the skin once a week.  4. Stop Januvia.  5. Continue checking blood sugars at home. 6. Continue making the lifestyle changes we've discussed together during our visit. Diet and exercise play a significant role in improving your blood sugars.  7. Follow-up with [name] next month.   Hypoglycemia or low blood sugar:   Low blood sugar can happen quickly and may become an emergency if not treated right away.   While this shouldn't happen often, it can be brought upon if you skip a meal or do not eat enough. Also, if your insulin or other diabetes medications are dosed too high, this can cause your blood sugar to go to low.   Warning signs of low blood sugar include: 1. Feeling shaky or dizzy 2. Feeling weak or tired  3. Excessive hunger 4. Feeling anxious or upset  5. Sweating even when you aren't exercising  What to do if I experience low blood sugar? 1. Check your blood sugar with your meter. If lower than 70, proceed to step 2.  2. Treat with 3-4 glucose tablets or 3 packets of regular sugar. If these aren't around, you can try hard candy. Yet another option would be to drink 4 ounces of fruit juice or 6 ounces of REGULAR soda.  3. Re-check your sugar in 15 minutes. If it is still below 70, do what you did in step 2 again. If has come back up, go ahead and eat a snack or small meal at this time.

## 2019-05-14 NOTE — Progress Notes (Signed)
    S:    PCP: [name]   No chief complaint on file.  Patient arrives in good spirits. Presents for diabetes f/u. Patient was referred and last seen by Primary Care Provider on 03/04/19. I saw her on 04/21/19 and adjusted her insulin.  Patient reports iabetes was diagnosed ~6-7 yrs ago.   Family/Social History:  - FHx: CAD, HTN (sister, brother) - Tobacco: never smoker - Alcohol: denies use  Insurance coverage/medication affordability: [employer]  Patient reports adherence with medications.  Current diabetes medications include: Levemir 30 units BID, Novolog 5 units TID, Januvia 50 mg daily Current hyperlipidemia medications include: none  Patient denies hypoglycemic events.  Patient reported dietary habits:  - does admit to eating potatoes; limits rice and bread - drinks water, light lemonade, regular gingerale  Patient-reported exercise habits:  - walks ~20 mins a day   Patient denies nocturia.  Patient reports worsening neuropathy. Patient denies baseline visual changes. Patient reports self foot exams.     O:  POCT glucose: 324  Gives home CBG range since seeing me:  Patient brings in a log: FCBG: 200s-300s; PPCBG: 300s-400s  Lab Results  Component Value Date   HGBA1C 13.8 (H) 02/22/2019   There were no vitals filed for this visit.  Lipid Panel     Component Value Date/Time   CHOL 106 12/30/2017 0436   TRIG 102 12/30/2017 0436   HDL 34 (L) 12/30/2017 0436   CHOLHDL 3.1 12/30/2017 0436   VLDL 20 12/30/2017 0436   LDLCALC 52 12/30/2017 0436   Clinical ASCVD: No  The ASCVD Risk score (Goff DC Jr., et al., 2013) failed to calculate for the following reasons:   The valid total cholesterol range is 130 to 320 mg/dL   Compelling indications: CHF, CKD  A/P: Diabetes longstanding currently uncontrolled.  Patient is able to verbalize appropriate hypoglycemia management plan. Patient is adherent with medication. Control is suboptimal due to dietary indiscretion,  physical inactivity.  -Increase Levemir to 40 BID -Novolog 5 units TID before meals -Extensively discussed pathophysiology of DM, recommended lifestyle interventions, dietary effects on glycemic control -Counseled on s/sx of and management of hypoglycemia -Next A1C anticipated 05/2019.   HM: UTD w/ PNA and tetanus. Flu vaccine indicated.  - flu shot given  Written patient instructions provided.  Total time in face to face counseling 15 minutes.   Follow up with PCP next month.   Patient seen with: Dillard Essex PharmD Candidate  Class of 2022 [place], PharmD, [place] [phone]

## 2019-05-15 ENCOUNTER — Encounter: Payer: Self-pay | Admitting: Pharmacist

## 2019-05-15 MED ORDER — INSULIN DETEMIR 100 UNIT/ML ~~LOC~~ SOLN: 40.0000 [IU] | Freq: Two times a day (BID) | SUBCUTANEOUS | 3 refills | Status: DC

## 2019-06-04 ENCOUNTER — Encounter (INDEPENDENT_AMBULATORY_CARE_PROVIDER_SITE_OTHER): Payer: Self-pay | Admitting: Primary Care

## 2019-06-04 ENCOUNTER — Other Ambulatory Visit: Payer: Self-pay | Admitting: Family Medicine

## 2019-06-04 ENCOUNTER — Other Ambulatory Visit: Payer: Self-pay

## 2019-06-04 ENCOUNTER — Ambulatory Visit (INDEPENDENT_AMBULATORY_CARE_PROVIDER_SITE_OTHER): Payer: Medicare HMO | Admitting: Primary Care

## 2019-06-04 VITALS — BP 136/78 | HR 77 | Temp 97.7°F | Ht 67.0 in | Wt 287.6 lb

## 2019-06-04 DIAGNOSIS — I4891 Unspecified atrial fibrillation: Secondary | ICD-10-CM | POA: Diagnosis not present

## 2019-06-04 DIAGNOSIS — E119 Type 2 diabetes mellitus without complications: Secondary | ICD-10-CM

## 2019-06-04 DIAGNOSIS — Z6841 Body Mass Index (BMI) 40.0 and over, adult: Secondary | ICD-10-CM | POA: Diagnosis not present

## 2019-06-04 LAB — POCT GLYCOSYLATED HEMOGLOBIN (HGB A1C): Hemoglobin A1C: 9.8 % — AB (ref 4.0–5.6)

## 2019-06-04 LAB — GLUCOSE, POCT (MANUAL RESULT ENTRY): POC Glucose: 288 mg/dl — AB (ref 70–99)

## 2019-06-04 NOTE — Patient Instructions (Signed)
Diabetes Basics  Diabetes (diabetes mellitus) is a long-term (chronic) disease. It occurs when the body does not properly use sugar (glucose) that is released from food after you eat. Diabetes may be caused by one or both of these problems:  Your pancreas does not make enough of a hormone called insulin.  Your body does not react in a normal way to insulin that it makes. Insulin lets sugars (glucose) go into cells in your body. This gives you energy. If you have diabetes, sugars cannot get into cells. This causes high blood sugar (hyperglycemia). Follow these instructions at home: How is diabetes treated? You may need to take insulin or other diabetes medicines daily to keep your blood sugar in balance. Take your diabetes medicines every day as told by your doctor. List your diabetes medicines here: Diabetes medicines  Name of medicine: ______________________________ ? Amount (dose): _______________ Time (a.m./p.m.): _______________ Notes: ___________________________________  Name of medicine: ______________________________ ? Amount (dose): _______________ Time (a.m./p.m.): _______________ Notes: ___________________________________  Name of medicine: ______________________________ ? Amount (dose): _______________ Time (a.m./p.m.): _______________ Notes: ___________________________________ If you use insulin, you will learn how to give yourself insulin by injection. You may need to adjust the amount based on the food that you eat. List the types of insulin you use here: Insulin  Insulin type: ______________________________ ? Amount (dose): _______________ Time (a.m./p.m.): _______________ Notes: ___________________________________  Insulin type: ______________________________ ? Amount (dose): _______________ Time (a.m./p.m.): _______________ Notes: ___________________________________  Insulin type: ______________________________ ? Amount (dose): _______________ Time (a.m./p.m.):  _______________ Notes: ___________________________________  Insulin type: ______________________________ ? Amount (dose): _______________ Time (a.m./p.m.): _______________ Notes: ___________________________________  Insulin type: ______________________________ ? Amount (dose): _______________ Time (a.m./p.m.): _______________ Notes: ___________________________________ How do I manage my blood sugar?  Check your blood sugar levels using a blood glucose monitor as directed by your doctor. Your doctor will set treatment goals for you. Generally, you should have these blood sugar levels:  Before meals (preprandial): 80-130 mg/dL (4.4-7.2 mmol/L).  After meals (postprandial): below 180 mg/dL (10 mmol/L).  A1c level: less than 7%. Write down the times that you will check your blood sugar levels: Blood sugar checks  Time: _______________ Notes: ___________________________________  Time: _______________ Notes: ___________________________________  Time: _______________ Notes: ___________________________________  Time: _______________ Notes: ___________________________________  Time: _______________ Notes: ___________________________________  Time: _______________ Notes: ___________________________________  What do I need to know about low blood sugar? Low blood sugar is called hypoglycemia. This is when blood sugar is at or below 70 mg/dL (3.9 mmol/L). Symptoms may include:  Feeling: ? Hungry. ? Worried or nervous (anxious). ? Sweaty and clammy. ? Confused. ? Dizzy. ? Sleepy. ? Sick to your stomach (nauseous).  Having: ? A fast heartbeat. ? A headache. ? A change in your vision. ? Tingling or no feeling (numbness) around the mouth, lips, or tongue. ? Jerky movements that you cannot control (seizure).  Having trouble with: ? Moving (coordination). ? Sleeping. ? Passing out (fainting). ? Getting upset easily (irritability). Treating low blood sugar To treat low blood  sugar, eat or drink something sugary right away. If you can think clearly and swallow safely, follow the 15:15 rule:  Take 15 grams of a fast-acting carb (carbohydrate). Talk with your doctor about how much you should take.  Some fast-acting carbs are: ? Sugar tablets (glucose pills). Take 3-4 glucose pills. ? 6-8 pieces of hard candy. ? 4-6 oz (120-150 mL) of fruit juice. ? 4-6 oz (120-150 mL) of regular (not diet) soda. ? 1 Tbsp (15 mL) honey or sugar.    Check your blood sugar 15 minutes after you take the carb.  If your blood sugar is still at or below 70 mg/dL (3.9 mmol/L), take 15 grams of a carb again.  If your blood sugar does not go above 70 mg/dL (3.9 mmol/L) after 3 tries, get help right away.  After your blood sugar goes back to normal, eat a meal or a snack within 1 hour. Treating very low blood sugar If your blood sugar is at or below 54 mg/dL (3 mmol/L), you have very low blood sugar (severe hypoglycemia). This is an emergency. Do not wait to see if the symptoms will go away. Get medical help right away. Call your local emergency services (911 in the [place] Do not drive yourself to the hospital. Questions to ask your health care provider  Do I need to meet with a diabetes educator?  What equipment will I need to care for myself at home?  What diabetes medicines do I need? When should I take them?  How often do I need to check my blood sugar?  What number can I call if I have questions?  When is my next doctor's visit?  Where can I find a support group for people with diabetes? Where to find more information  American Diabetes Association: www.diabetes.org  American Association of Diabetes Educators: www.diabeteseducator.org/patient-resources Contact a doctor if:  Your blood sugar is at or above 240 mg/dL (13.3 mmol/L) for 2 days in a row.  You have been sick or have had a fever for 2 days or more, and you are not getting better.  You have any of these  problems for more than 6 hours: ? You cannot eat or drink. ? You feel sick to your stomach (nauseous). ? You throw up (vomit). ? You have watery poop (diarrhea). Get help right away if:  Your blood sugar is lower than 54 mg/dL (3 mmol/L).  You get confused.  You have trouble: ? Thinking clearly. ? Breathing. Summary  Diabetes (diabetes mellitus) is a long-term (chronic) disease. It occurs when the body does not properly use sugar (glucose) that is released from food after digestion.  Take insulin and diabetes medicines as told.  Check your blood sugar every day, as often as told.  Keep all follow-up visits as told by your doctor. This is important. This information is not intended to replace advice given to you by your health care provider. Make sure you discuss any questions you have with your health care provider. Document Released: 11/09/2017 Document Revised: 09/27/2018 Document Reviewed: 11/09/2017 Elsevier Patient Education  2020 Elsevier Inc.  

## 2019-06-04 NOTE — Progress Notes (Signed)
 Established Patient Office Visit  Subjective:  Patient ID: [name], female    DOB: 04/26/1956  Age: 63 y.o. MRN: [id number]  CC:  Chief Complaint  Patient presents with  . Follow-up    DM    HPI [name] presents for management of type 2 diabetes  In conjunction with the clinical pharmacist. Her only complaint is the new insulin makes her not eat and when she does eat it is very little. Other than that she voices no complaints or concerns.  Past Medical History:  Diagnosis Date  . Allergic rhinitis   . Arthritis   . Asthma   . Chest pain 12/27/2017  . Chronic diastolic CHF (congestive heart failure) (HCC)   . COPD (chronic obstructive pulmonary disease) (HCC)   . Depression   . DM (diabetes mellitus) (HCC)   . DVT (deep vein thrombosis) in pregnancy   . Dysrhythmia    atrial fibrilation  . GERD (gastroesophageal reflux disease)   . Headache(784.0)   . HTN (hypertension)   . Hyperlipidemia   . Obesity   . Pneumonia 04/2018   RIGHT LOBE  . Shortness of breath   . Sleep apnea    compliant with CPAP    Past Surgical History:  Procedure Laterality Date  . ABDOMINAL HYSTERECTOMY    . CATARACT EXTRACTION, BILATERAL    . CHOLECYSTECTOMY    . COLONOSCOPY WITH PROPOFOL N/A 03/05/2015   Procedure: COLONOSCOPY WITH PROPOFOL;  Surgeon: Patrick Hung, MD;  Location: [facility];  Service: Endoscopy;  Laterality: N/A;  . DIAGNOSTIC LAPAROSCOPY    . ingrown hallux Left   . KNEE SURGERY    . LEFT HEART CATH AND CORONARY ANGIOGRAPHY N/A 12/31/2017   Procedure: LEFT HEART CATH AND CORONARY ANGIOGRAPHY;  Surgeon: Harwani, Mohan, MD;  Location: [facility];  Service: Cardiovascular;  Laterality: N/A;    Family History  Problem Relation Age of Onset  . Cancer Mother   . Hypertension Mother   . Cancer Father   . CAD Other   . Hypertension Sister   . Hypertension Brother     Social History   Socioeconomic History  . Marital status: Widowed     Spouse name: Not on file  . Number of children: Not on file  . Years of education: Not on file  . Highest education level: Not on file  Occupational History  . Not on file  Social Needs  . Financial resource strain: Not on file  . Food insecurity    Worry: Not on file    Inability: Not on file  . Transportation needs    Medical: Not on file    Non-medical: Not on file  Tobacco Use  . Smoking status: Never Smoker  . Smokeless tobacco: Never Used  Substance and Sexual Activity  . Alcohol use: No  . Drug use: No  . Sexual activity: Not on file  Lifestyle  . Physical activity    Days per week: Not on file    Minutes per session: Not on file  . Stress: Not on file  Relationships  . Social connections    Talks on phone: Not on file    Gets together: Not on file    Attends religious service: Not on file    Active member of club or organization: Not on file    Attends meetings of clubs or organizations: Not on file    Relationship status: Not on file  . Intimate partner violence      Fear of current or ex partner: Not on file    Emotionally abused: Not on file    Physically abused: Not on file    Forced sexual activity: Not on file  Other Topics Concern  . Not on file  Social History Narrative   Pt lives in [place] with spouse.  2 grown children.   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    Outpatient Medications Prior to Visit  Medication Sig Dispense Refill  . Blood Glucose Monitoring Suppl (TRUE METRIX METER) w/Device KIT Use to check blood sugar daily. 1 kit 0  . digoxin (LANOXIN) 0.125 MG tablet Take 1 tablet (125 mcg total) by mouth daily. 30 tablet 11  . Dulaglutide (TRULICITY) 0.75 MG/0.5ML SOPN Inject 0.75 mg into the skin once a week. 2 mL 2  . furosemide (LASIX) 40 MG tablet Take 1 tablet (40 mg total) by mouth daily. 60 tablet 11  . gabapentin (NEURONTIN) 300 MG capsule Take 300 mg by mouth 4 (four) times daily.     . glucose blood (TRUE  METRIX BLOOD GLUCOSE TEST) test strip Use as instructed to check blood sugar daily. 100 each 11  . insulin detemir (LEVEMIR) 100 UNIT/ML injection Inject 0.4 mLs (40 Units total) into the skin 2 (two) times daily. 20 mL 3  . Insulin Syringe-Needle U-100 (TRUEPLUS INSULIN SYRINGE) 31G X 5/16" 0.3 ML MISC Use to administer insulin every day. 100 each 11  . metolazone (ZAROXOLYN) 5 MG tablet Take 5 mg by mouth daily.    . metoprolol tartrate (LOPRESSOR) 25 MG tablet Take 1 tablet (25 mg total) by mouth 2 (two) times daily. 60 tablet 0  . Multiple Vitamins-Minerals (MULTIVITAMIN WOMEN PO) Take 1 tablet by mouth daily.    . pantoprazole (PROTONIX) 40 MG tablet Take 1 tablet (40 mg total) by mouth 2 (two) times daily. 60 tablet 0  . potassium chloride SA (K-DUR,KLOR-CON) 20 MEQ tablet Take 1 tablet (20 mEq total) by mouth daily. 30 tablet 3  . PRADAXA 75 MG CAPS capsule Take 75 mg by mouth 2 (two) times daily.    . Accu-Chek FastClix Lancets MISC Use as directed three times daily 102 each 12  . Alcohol Swabs (B-D SINGLE USE SWABS REGULAR) PADS     . DULoxetine (CYMBALTA) 60 MG capsule Take 1 capsule (60 mg total) by mouth daily. 30 capsule 3  . Melatonin 5 MG CAPS Take 1 capsule (5 mg total) by mouth at bedtime as needed. 30 capsule 3  . nitroGLYCERIN (NITROSTAT) 0.4 MG SL tablet Place 1 tablet (0.4 mg total) under the tongue every 5 (five) minutes x 3 doses as needed for chest pain. 25 tablet 12  . RESTASIS 0.05 % ophthalmic emulsion Place 1 drop into both eyes 2 (two) times daily.  12  . NOVOLOG 100 UNIT/ML injection INJECT 8 UNITS INTO THE SKIN 3 (THREE) TIMES DAILY WITH MEALS. TAKE ONLY WHEN YOUR EAT MORE THAN 50% OF YOUR MEALS 10 mL 0   No facility-administered medications prior to visit.     Allergies  Allergen Reactions  . Sulfa Antibiotics Itching    ROS Review of Systems  All other systems reviewed and are negative.     Objective:    Physical Exam  Constitutional: She is oriented  to person, place, and time. She appears well-developed and well-nourished.  Neck: Normal range of motion. Neck supple.  Cardiovascular: Normal rate and regular rhythm.  Pulmonary/Chest: Effort normal and breath sounds   normal.  Abdominal: Soft. Bowel sounds are normal. She exhibits distension.  Musculoskeletal: Normal range of motion.  Neurological: She is oriented to person, place, and time.  Psychiatric: She has a normal mood and affect.    BP 136/78 (BP Location: Left Arm, Patient Position: Sitting, Cuff Size: Large)   Pulse 77   Temp 97.7 F (36.5 C) (Temporal)   Ht 5' 7" (1.702 m)   Wt 287 lb 9.6 oz (130.5 kg)   SpO2 94%   BMI 45.04 kg/m  Wt Readings from Last 3 Encounters:  06/04/19 287 lb 9.6 oz (130.5 kg)  03/04/19 (!) 307 lb 3.2 oz (139.3 kg)  02/21/19 276 lb 0.3 oz (125.2 kg)     Health Maintenance Due  Topic Date Due  . FOOT EXAM  07/23/1966  . OPHTHALMOLOGY EXAM  07/23/1966  . PAP SMEAR-Modifier  07/23/1977  . MAMMOGRAM  07/23/2006    There are no preventive care reminders to display for this patient.  Lab Results  Component Value Date   TSH 4.210 03/04/2019   Lab Results  Component Value Date   WBC 9.0 02/21/2019   HGB 16.7 (H) 02/21/2019   HCT 49.2 (H) 02/21/2019   MCV 87.1 02/21/2019   PLT 181 02/21/2019   Lab Results  Component Value Date   NA 135 02/24/2019   K 3.4 (L) 02/24/2019   CO2 26 02/24/2019   GLUCOSE 272 (H) 02/24/2019   BUN 22 02/24/2019   CREATININE 1.64 (H) 02/24/2019   BILITOT 1.9 (H) 02/21/2019   ALKPHOS 63 02/21/2019   AST 28 02/21/2019   ALT 17 02/21/2019   PROT 9.0 (H) 02/21/2019   ALBUMIN 3.5 02/21/2019   CALCIUM 8.7 (L) 02/24/2019   ANIONGAP 10 02/24/2019   Lab Results  Component Value Date   CHOL 106 12/30/2017   Lab Results  Component Value Date   HDL 34 (L) 12/30/2017   Lab Results  Component Value Date   LDLCALC 52 12/30/2017   Lab Results  Component Value Date   TRIG 102 12/30/2017   Lab Results   Component Value Date   CHOLHDL 3.1 12/30/2017   Lab Results  Component Value Date   HGBA1C 9.8 (A) 06/04/2019      Assessment & Plan:  Deondra was seen today for follow-up.  Diagnoses and all orders for this visit:  Type 2 diabetes mellitus without complication, without long-term current use of insulin (HCC) Discussed decreasing foods that are high in carbohydrates are the following rice, potatoes, breads, sugars, and pastas.  Reduction in the intake (eating) will assist in lowering your blood sugars. -     HgB A1c -     Glucose (CBG) -     Lipid panel; Future -     CBC with Differential/Platelet; Future -     CMP14+EGFR; Future  Morbid obesity (HCC) Morbid Obesity is BMI greater than 40 , discussed  excess in caloric intake is the underlining conditions. This may lead to other co-morbidities. Lifestyle modifications of diet and exercise may reduce obesity.   Atrial fibrillation with RVR ([place]) Followed by cardiology Dr. Lyla Son last visit 03/03/2019   No orders of the defined types were placed in this encounter.   Follow-up: Return in about 3 months (around 09/04/2019) for fasting labs than follow up in 3 months in person.    [name], NP

## 2019-06-04 NOTE — Progress Notes (Signed)
Ate at 8am

## 2019-06-09 ENCOUNTER — Other Ambulatory Visit: Payer: Self-pay

## 2019-06-09 ENCOUNTER — Other Ambulatory Visit (INDEPENDENT_AMBULATORY_CARE_PROVIDER_SITE_OTHER): Payer: Medicare HMO

## 2019-06-09 DIAGNOSIS — Z20822 Contact with and (suspected) exposure to covid-19: Secondary | ICD-10-CM

## 2019-06-10 DIAGNOSIS — G4733 Obstructive sleep apnea (adult) (pediatric): Secondary | ICD-10-CM | POA: Diagnosis not present

## 2019-06-11 ENCOUNTER — Ambulatory Visit: Payer: Medicare HMO | Admitting: Podiatry

## 2019-06-11 LAB — NOVEL CORONAVIRUS, NAA: SARS-CoV-2, NAA: NOT DETECTED

## 2019-06-12 ENCOUNTER — Telehealth (INDEPENDENT_AMBULATORY_CARE_PROVIDER_SITE_OTHER): Payer: Self-pay

## 2019-06-12 ENCOUNTER — Other Ambulatory Visit (INDEPENDENT_AMBULATORY_CARE_PROVIDER_SITE_OTHER): Payer: Self-pay | Admitting: Primary Care

## 2019-06-12 DIAGNOSIS — E669 Obesity, unspecified: Secondary | ICD-10-CM

## 2019-06-12 DIAGNOSIS — E1169 Type 2 diabetes mellitus with other specified complication: Secondary | ICD-10-CM

## 2019-06-12 MED ORDER — INSULIN DETEMIR 100 UNIT/ML ~~LOC~~ SOLN: 50.0000 [IU] | Freq: Two times a day (BID) | SUBCUTANEOUS | 3 refills | Status: DC

## 2019-06-12 NOTE — Telephone Encounter (Signed)
Call placed to [facility]. Per Otila Kluver, the claim was unable to be located. In addition, pt is in catastrophic coverage. I contacted [name] and she will be unable to pay the ~$400 copay. Catastrophic coverage will be in place until the beginning of 2021.   In regards to her therapy, unfortunately she is unable to afford the Trulicity. She will likely need titration/increase in insulin. I recommend for her to monitor her home sugars and follow-up in 1 month for adjustment of therapy. Will forward to PCP for approval of the plan.

## 2019-06-12 NOTE — Telephone Encounter (Signed)
Patient called about her Trulicity. She was unable to covey the exact issue. CMA advised patient that she would call the pharmacy for clarity and call her(patient) back. Pharmacy staff informed that patient needed to call her insurance and have them Back Out or Reverse the claim so that they could then fill it. CMA then contacted clinical pharmacist and informed him of what was happening. He stated he would call patients insurance and then call CMA back with more details. Nat Christen, CMA

## 2019-07-11 DIAGNOSIS — N189 Chronic kidney disease, unspecified: Secondary | ICD-10-CM | POA: Diagnosis not present

## 2019-07-11 DIAGNOSIS — I509 Heart failure, unspecified: Secondary | ICD-10-CM | POA: Diagnosis not present

## 2019-07-11 DIAGNOSIS — G4733 Obstructive sleep apnea (adult) (pediatric): Secondary | ICD-10-CM | POA: Diagnosis not present

## 2019-07-11 DIAGNOSIS — I10 Essential (primary) hypertension: Secondary | ICD-10-CM | POA: Diagnosis not present

## 2019-07-11 DIAGNOSIS — I38 Endocarditis, valve unspecified: Secondary | ICD-10-CM | POA: Diagnosis not present

## 2019-07-11 DIAGNOSIS — E119 Type 2 diabetes mellitus without complications: Secondary | ICD-10-CM | POA: Diagnosis not present

## 2019-07-11 DIAGNOSIS — I482 Chronic atrial fibrillation, unspecified: Secondary | ICD-10-CM | POA: Diagnosis not present

## 2019-07-11 DIAGNOSIS — I890 Lymphedema, not elsewhere classified: Secondary | ICD-10-CM | POA: Diagnosis not present

## 2019-08-05 ENCOUNTER — Other Ambulatory Visit: Payer: Self-pay | Admitting: Family Medicine

## 2019-08-05 DIAGNOSIS — E1169 Type 2 diabetes mellitus with other specified complication: Secondary | ICD-10-CM

## 2019-08-05 NOTE — Telephone Encounter (Signed)
Please fill if appropriate request

## 2019-09-01 ENCOUNTER — Other Ambulatory Visit: Payer: Self-pay | Admitting: Family Medicine

## 2019-09-03 ENCOUNTER — Ambulatory Visit (INDEPENDENT_AMBULATORY_CARE_PROVIDER_SITE_OTHER): Payer: Medicare HMO | Admitting: Podiatry

## 2019-09-03 ENCOUNTER — Encounter: Payer: Self-pay | Admitting: Podiatry

## 2019-09-03 DIAGNOSIS — B351 Tinea unguium: Secondary | ICD-10-CM | POA: Diagnosis not present

## 2019-09-03 DIAGNOSIS — M79675 Pain in left toe(s): Secondary | ICD-10-CM

## 2019-09-03 DIAGNOSIS — M79674 Pain in right toe(s): Secondary | ICD-10-CM | POA: Diagnosis not present

## 2019-09-03 DIAGNOSIS — E1142 Type 2 diabetes mellitus with diabetic polyneuropathy: Secondary | ICD-10-CM | POA: Diagnosis not present

## 2019-09-03 NOTE — Patient Instructions (Signed)

## 2019-09-04 ENCOUNTER — Emergency Department (HOSPITAL_COMMUNITY)
Admission: EM | Admit: 2019-09-04 | Discharge: 2019-09-04 | Disposition: A | Discharge status: Home / Self Care | Payer: Medicare HMO | Attending: Emergency Medicine | Admitting: Emergency Medicine

## 2019-09-04 ENCOUNTER — Encounter (HOSPITAL_COMMUNITY): Payer: Self-pay

## 2019-09-04 ENCOUNTER — Other Ambulatory Visit: Payer: Self-pay

## 2019-09-04 ENCOUNTER — Emergency Department (HOSPITAL_COMMUNITY): Payer: Medicare HMO

## 2019-09-04 ENCOUNTER — Ambulatory Visit (INDEPENDENT_AMBULATORY_CARE_PROVIDER_SITE_OTHER): Payer: Medicare HMO | Admitting: Primary Care

## 2019-09-04 VITALS — BP 158/118 | HR 73 | Temp 97.3°F | Resp 18 | Wt 296.0 lb

## 2019-09-04 DIAGNOSIS — E119 Type 2 diabetes mellitus without complications: Secondary | ICD-10-CM

## 2019-09-04 DIAGNOSIS — Z79899 Other long term (current) drug therapy: Secondary | ICD-10-CM | POA: Diagnosis not present

## 2019-09-04 DIAGNOSIS — J449 Chronic obstructive pulmonary disease, unspecified: Secondary | ICD-10-CM | POA: Insufficient documentation

## 2019-09-04 DIAGNOSIS — R1012 Left upper quadrant pain: Secondary | ICD-10-CM

## 2019-09-04 DIAGNOSIS — E1365 Other specified diabetes mellitus with hyperglycemia: Secondary | ICD-10-CM

## 2019-09-04 DIAGNOSIS — I5042 Chronic combined systolic (congestive) and diastolic (congestive) heart failure: Secondary | ICD-10-CM | POA: Insufficient documentation

## 2019-09-04 DIAGNOSIS — R634 Abnormal weight loss: Secondary | ICD-10-CM | POA: Diagnosis not present

## 2019-09-04 DIAGNOSIS — Z20822 Contact with and (suspected) exposure to covid-19: Secondary | ICD-10-CM | POA: Diagnosis not present

## 2019-09-04 DIAGNOSIS — I110 Hypertensive heart disease with heart failure: Secondary | ICD-10-CM | POA: Diagnosis not present

## 2019-09-04 DIAGNOSIS — R079 Chest pain, unspecified: Secondary | ICD-10-CM

## 2019-09-04 DIAGNOSIS — F419 Anxiety disorder, unspecified: Secondary | ICD-10-CM | POA: Insufficient documentation

## 2019-09-04 DIAGNOSIS — R0789 Other chest pain: Secondary | ICD-10-CM | POA: Diagnosis not present

## 2019-09-04 DIAGNOSIS — Z794 Long term (current) use of insulin: Secondary | ICD-10-CM | POA: Insufficient documentation

## 2019-09-04 DIAGNOSIS — R0602 Shortness of breath: Secondary | ICD-10-CM | POA: Diagnosis not present

## 2019-09-04 DIAGNOSIS — E1165 Type 2 diabetes mellitus with hyperglycemia: Secondary | ICD-10-CM | POA: Insufficient documentation

## 2019-09-04 DIAGNOSIS — I4891 Unspecified atrial fibrillation: Secondary | ICD-10-CM | POA: Diagnosis not present

## 2019-09-04 LAB — LIPASE, BLOOD: Lipase: 16 U/L (ref 11–51)

## 2019-09-04 LAB — CBG MONITORING, ED: Glucose-Capillary: 317 mg/dL — ABNORMAL HIGH (ref 70–99)

## 2019-09-04 LAB — CBC WITH DIFFERENTIAL/PLATELET
Abs Immature Granulocytes: 0.01 10*3/uL (ref 0.00–0.07)
Basophils Absolute: 0 10*3/uL (ref 0.0–0.1)
Basophils Relative: 1 %
Eosinophils Absolute: 0.1 10*3/uL (ref 0.0–0.5)
Eosinophils Relative: 1 %
HCT: 45.2 % (ref 36.0–46.0)
Hemoglobin: 15 g/dL (ref 12.0–15.0)
Immature Granulocytes: 0 %
Lymphocytes Relative: 29 %
Lymphs Abs: 1.8 10*3/uL (ref 0.7–4.0)
MCH: 29.9 pg (ref 26.0–34.0)
MCHC: 33.2 g/dL (ref 30.0–36.0)
MCV: 90.2 fL (ref 80.0–100.0)
Monocytes Absolute: 0.8 10*3/uL (ref 0.1–1.0)
Monocytes Relative: 12 %
Neutro Abs: 3.6 10*3/uL (ref 1.7–7.7)
Neutrophils Relative %: 57 %
Platelets: 248 10*3/uL (ref 150–400)
RBC: 5.01 MIL/uL (ref 3.87–5.11)
RDW: 13.2 % (ref 11.5–15.5)
WBC: 6.3 10*3/uL (ref 4.0–10.5)
nRBC: 0 % (ref 0.0–0.2)

## 2019-09-04 LAB — COMPREHENSIVE METABOLIC PANEL
ALT: 17 U/L (ref 0–44)
AST: 20 U/L (ref 15–41)
Albumin: 3.4 g/dL — ABNORMAL LOW (ref 3.5–5.0)
Alkaline Phosphatase: 71 U/L (ref 38–126)
Anion gap: 13 (ref 5–15)
BUN: 23 mg/dL (ref 8–23)
CO2: 31 mmol/L (ref 22–32)
Calcium: 10 mg/dL (ref 8.9–10.3)
Chloride: 92 mmol/L — ABNORMAL LOW (ref 98–111)
Creatinine, Ser: 1.78 mg/dL — ABNORMAL HIGH (ref 0.44–1.00)
GFR calc Af Amer: 35 mL/min — ABNORMAL LOW (ref >60)
GFR calc non Af Amer: 30 mL/min — ABNORMAL LOW (ref >60)
Glucose, Bld: 381 mg/dL — ABNORMAL HIGH (ref 70–99)
Potassium: 3.5 mmol/L (ref 3.5–5.1)
Sodium: 136 mmol/L (ref 135–145)
Total Bilirubin: 1.1 mg/dL (ref 0.3–1.2)
Total Protein: 7.9 g/dL (ref 6.5–8.1)

## 2019-09-04 LAB — TROPONIN I (HIGH SENSITIVITY)
Troponin I (High Sensitivity): 5 ng/L (ref <18)
Troponin I (High Sensitivity): 5 ng/L (ref <18)

## 2019-09-04 LAB — BRAIN NATRIURETIC PEPTIDE: B Natriuretic Peptide: 45.8 pg/mL (ref 0.0–100.0)

## 2019-09-04 MED ORDER — ONDANSETRON 4 MG PO TBDP
4.0000 mg | ORAL_TABLET | Freq: Once | ORAL | Status: AC
  Administered 2019-09-04: 4 mg via ORAL

## 2019-09-04 MED ORDER — ONDANSETRON HCL 4 MG/2ML IJ SOLN
4.0000 mg | Freq: Once | INTRAMUSCULAR | Status: AC
  Administered 2019-09-04: 4 mg via INTRAVENOUS
  Filled 2019-09-04: qty 2

## 2019-09-04 MED ORDER — NITROGLYCERIN 0.4 MG SL SUBL
0.4000 mg | SUBLINGUAL_TABLET | Freq: Once | SUBLINGUAL | Status: AC
  Administered 2019-09-04: 0.4 mg via SUBLINGUAL

## 2019-09-04 MED ORDER — INSULIN ASPART 100 UNIT/ML ~~LOC~~ SOLN
10.0000 [IU] | Freq: Once | SUBCUTANEOUS | Status: AC
  Administered 2019-09-04: 10 [IU] via SUBCUTANEOUS

## 2019-09-04 MED ORDER — ALUM & MAG HYDROXIDE-SIMETH 200-200-20 MG/5ML PO SUSP
30.0000 mL | Freq: Once | ORAL | Status: AC
  Administered 2019-09-04: 30 mL via ORAL
  Filled 2019-09-04: qty 30

## 2019-09-04 MED ORDER — SODIUM CHLORIDE 0.9 % IV BOLUS
500.0000 mL | Freq: Once | INTRAVENOUS | Status: AC
  Administered 2019-09-04: 13:00:00 500 mL via INTRAVENOUS

## 2019-09-04 MED ORDER — MORPHINE SULFATE (PF) 2 MG/ML IV SOLN
2.0000 mg | Freq: Once | INTRAVENOUS | Status: AC
  Administered 2019-09-04: 12:00:00 2 mg via INTRAVENOUS
  Filled 2019-09-04: qty 1

## 2019-09-04 MED ORDER — SUCRALFATE 1 G PO TABS: 1.0000 g | ORAL_TABLET | Freq: Three times a day (TID) | ORAL | 0 refills | Status: DC

## 2019-09-04 MED ORDER — ASPIRIN 325 MG PO TABS
325.0000 mg | ORAL_TABLET | Freq: Once | ORAL | Status: AC
  Administered 2019-09-04: 325 mg via ORAL

## 2019-09-04 NOTE — Patient Instructions (Signed)
Hypertension, Adult Hypertension is another name for high blood pressure. High blood pressure forces your heart to work harder to pump blood. This can cause problems over time. There are two numbers in a blood pressure reading. There is a top number (systolic) over a bottom number (diastolic). It is best to have a blood pressure that is below 120/80. Healthy choices can help lower your blood pressure, or you may need medicine to help lower it. What are the causes? The cause of this condition is not known. Some conditions may be related to high blood pressure. What increases the risk?  Smoking.  Having type 2 diabetes mellitus, high cholesterol, or both.  Not getting enough exercise or physical activity.  Being overweight.  Having too much fat, sugar, calories, or salt (sodium) in your diet.  Drinking too much alcohol.  Having long-term (chronic) kidney disease.  Having a family history of high blood pressure.  Age. Risk increases with age.  Race. You may be at higher risk if you are African American.  Gender. Men are at higher risk than women before age 45. After age 65, women are at higher risk than men.  Having obstructive sleep apnea.  Stress. What are the signs or symptoms?  High blood pressure may not cause symptoms. Very high blood pressure (hypertensive crisis) may cause: ? Headache. ? Feelings of worry or nervousness (anxiety). ? Shortness of breath. ? Nosebleed. ? A feeling of being sick to your stomach (nausea). ? Throwing up (vomiting). ? Changes in how you see. ? Very bad chest pain. ? Seizures. How is this treated?  This condition is treated by making healthy lifestyle changes, such as: ? Eating healthy foods. ? Exercising more. ? Drinking less alcohol.  Your health care provider may prescribe medicine if lifestyle changes are not enough to get your blood pressure under control, and if: ? Your top number is above 130. ? Your bottom number is above  80.  Your personal target blood pressure may vary. Follow these instructions at home: Eating and drinking   If told, follow the DASH eating plan. To follow this plan: ? Fill one half of your plate at each meal with fruits and vegetables. ? Fill one fourth of your plate at each meal with whole grains. Whole grains include whole-wheat pasta, brown rice, and whole-grain bread. ? Eat or drink low-fat dairy products, such as skim milk or low-fat yogurt. ? Fill one fourth of your plate at each meal with low-fat (lean) proteins. Low-fat proteins include fish, chicken without skin, eggs, beans, and tofu. ? Avoid fatty meat, cured and processed meat, or chicken with skin. ? Avoid pre-made or processed food.  Eat less than 1,500 mg of salt each day.  Do not drink alcohol if: ? Your doctor tells you not to drink. ? You are pregnant, may be pregnant, or are planning to become pregnant.  If you drink alcohol: ? Limit how much you use to:  0-1 drink a day for women.  0-2 drinks a day for men. ? Be aware of how much alcohol is in your drink. In the [place] one drink equals one 12 oz bottle of beer (355 mL), one 5 oz glass of wine (148 mL), or one 1 oz glass of hard liquor (44 mL). Lifestyle   Work with your doctor to stay at a healthy weight or to lose weight. Ask your doctor what the best weight is for you.  Get at least 30 minutes of exercise most   days of the week. This may include walking, swimming, or biking.  Get at least 30 minutes of exercise that strengthens your muscles (resistance exercise) at least 3 days a week. This may include lifting weights or doing Pilates.  Do not use any products that contain nicotine or tobacco, such as cigarettes, e-cigarettes, and chewing tobacco. If you need help quitting, ask your doctor.  Check your blood pressure at home as told by your doctor.  Keep all follow-up visits as told by your doctor. This is important. Medicines  Take over-the-counter  and prescription medicines only as told by your doctor. Follow directions carefully.  Do not skip doses of blood pressure medicine. The medicine does not work as well if you skip doses. Skipping doses also puts you at risk for problems.  Ask your doctor about side effects or reactions to medicines that you should watch for. Contact a doctor if you:  Think you are having a reaction to the medicine you are taking.  Have headaches that keep coming back (recurring).  Feel dizzy.  Have swelling in your ankles.  Have trouble with your vision. Get help right away if you:  Get a very bad headache.  Start to feel mixed up (confused).  Feel weak or numb.  Feel faint.  Have very bad pain in your: ? Chest. ? Belly (abdomen).  Throw up more than once.  Have trouble breathing. Summary  Hypertension is another name for high blood pressure.  High blood pressure forces your heart to work harder to pump blood.  For most people, a normal blood pressure is less than 120/80.  Making healthy choices can help lower blood pressure. If your blood pressure does not get lower with healthy choices, you may need to take medicine. This information is not intended to replace advice given to you by your health care provider. Make sure you discuss any questions you have with your health care provider. Document Revised: 04/17/2018 Document Reviewed: 04/17/2018 Elsevier Patient Education  2020 [employer]vier Inc.  

## 2019-09-04 NOTE — Progress Notes (Signed)
Established Patient Office Visit  Subjective:  Patient ID: [name], female    DOB: 1955/11/01  Age: 64 y.o. MRN: [id number]  CC:  Chief Complaint  Patient presents with  . Follow-up    HPI [name] presents for management of blood pressure elevated at this visit she has not taking any medication knowing she would need blood work. Denies shortness of breath, headaches, chest pain or lower extremity edema. She complains of abdominal pain ,upper chest pain left side does not radiate. These symptoms have been 3-4 weeks . Use of inhaler helps and rest.  Past Medical History:  Diagnosis Date  . Allergic rhinitis   . Arthritis   . Asthma   . Chest pain 12/27/2017  . Chronic diastolic CHF (congestive heart failure) ([place])   . COPD (chronic obstructive pulmonary disease) ([place])   . Depression   . DM (diabetes mellitus) ([place])   . DVT (deep vein thrombosis) in pregnancy   . Dysrhythmia    atrial fibrilation  . GERD (gastroesophageal reflux disease)   . Headache(784.0)   . HTN (hypertension)   . Hyperlipidemia   . Obesity   . Pneumonia 04/2018   RIGHT LOBE  . Shortness of breath   . Sleep apnea    compliant with CPAP    Past Surgical History:  Procedure Laterality Date  . ABDOMINAL HYSTERECTOMY    . CATARACT EXTRACTION, BILATERAL    . CHOLECYSTECTOMY    . COLONOSCOPY WITH PROPOFOL N/A 03/05/2015   Procedure: COLONOSCOPY WITH PROPOFOL;  Surgeon: Carol Ada, MD;  Location: [facility];  Service: Endoscopy;  Laterality: N/A;  . DIAGNOSTIC LAPAROSCOPY    . ingrown hallux Left   . KNEE SURGERY    . LEFT HEART CATH AND CORONARY ANGIOGRAPHY N/A 12/31/2017   Procedure: LEFT HEART CATH AND CORONARY ANGIOGRAPHY;  Surgeon: Charolette Forward, MD;  Location: [facility];  Service: Cardiovascular;  Laterality: N/A;    Family History  Problem Relation Age of Onset  . Cancer Mother   . Hypertension Mother   . Cancer Father   . CAD Other   . Hypertension  Sister   . Hypertension Brother     Social History   Socioeconomic History  . Marital status: Widowed    Spouse name: Not on file  . Number of children: Not on file  . Years of education: Not on file  . Highest education level: Not on file  Occupational History  . Not on file  Tobacco Use  . Smoking status: Never Smoker  . Smokeless tobacco: Never Used  Substance and Sexual Activity  . Alcohol use: No  . Drug use: No  . Sexual activity: Not on file  Other Topics Concern  . Not on file  Social History Narrative   Pt lives in [place] with spouse.  2 grown children.   Previously worked in [employer] at [employer].  Now on disability   Social Determinants of Health   Financial Resource Strain:   . Difficulty of Paying Living Expenses: Not on file  Food Insecurity:   . Worried About Charity fundraiser in the Last Year: Not on file  . Ran Out of Food in the Last Year: Not on file  Transportation Needs:   . Lack of Transportation (Medical): Not on file  . Lack of Transportation (Non-Medical): Not on file  Physical Activity:   . Days of Exercise per Week: Not on file  . Minutes of Exercise per Session: Not  on file  Stress:   . Feeling of Stress : Not on file  Social Connections:   . Frequency of Communication with Friends and Family: Not on file  . Frequency of Social Gatherings with Friends and Family: Not on file  . Attends Religious Services: Not on file  . Active Member of Clubs or Organizations: Not on file  . Attends Archivist Meetings: Not on file  . Marital Status: Not on file  Intimate Partner Violence:   . Fear of Current or Ex-Partner: Not on file  . Emotionally Abused: Not on file  . Physically Abused: Not on file  . Sexually Abused: Not on file    Outpatient Medications Prior to Visit  Medication Sig Dispense Refill  . Accu-Chek FastClix Lancets MISC Use as directed three times daily 102 each 12  . albuterol (VENTOLIN HFA) 108 (90 Base)  MCG/ACT inhaler     . Alcohol Swabs (B-D SINGLE USE SWABS REGULAR) PADS     . Blood Glucose Monitoring Suppl (TRUE METRIX METER) w/Device KIT Use to check blood sugar daily. 1 kit 0  . digoxin (LANOXIN) 0.125 MG tablet Take 1 tablet (125 mcg total) by mouth daily. 30 tablet 11  . DULoxetine (CYMBALTA) 60 MG capsule Take 1 capsule (60 mg total) by mouth daily. 30 capsule 3  . furosemide (LASIX) 40 MG tablet Take 1 tablet (40 mg total) by mouth daily. 60 tablet 11  . gabapentin (NEURONTIN) 300 MG capsule Take 300 mg by mouth 4 (four) times daily.     Marland Kitchen glucose blood (TRUE METRIX BLOOD GLUCOSE TEST) test strip Use as instructed to check blood sugar daily. 100 each 11  . Insulin Syringe-Needle U-100 (TRUEPLUS INSULIN SYRINGE) 31G X 5/16" 0.3 ML MISC Use to administer insulin every day. 100 each 11  . LEVEMIR 100 UNIT/ML injection INJECT 0.4 MLS (40 UNITS TOTAL) INTO THE SKIN 2 (TWO) TIMES DAILY. 60 mL 1  . Melatonin 5 MG CAPS Take 1 capsule (5 mg total) by mouth at bedtime as needed. 30 capsule 3  . metolazone (ZAROXOLYN) 5 MG tablet Take 5 mg by mouth daily.    . metoprolol tartrate (LOPRESSOR) 25 MG tablet Take 1 tablet (25 mg total) by mouth 2 (two) times daily. 60 tablet 0  . Multiple Vitamins-Minerals (MULTIVITAMIN WOMEN PO) Take 1 tablet by mouth daily.    . nitroGLYCERIN (NITROSTAT) 0.4 MG SL tablet Place 1 tablet (0.4 mg total) under the tongue every 5 (five) minutes x 3 doses as needed for chest pain. 25 tablet 12  . NOVOLOG 100 UNIT/ML injection INJECT 8 UNITS INTO THE SKIN 3 (THREE) TIMES DAILY WITH MEALS. TAKE ONLY WHEN YOUR EAT MORE THAN 50% OF YOUR MEALS 30 mL 0  . pantoprazole (PROTONIX) 40 MG tablet Take 1 tablet (40 mg total) by mouth 2 (two) times daily. 60 tablet 0  . potassium chloride SA (K-DUR,KLOR-CON) 20 MEQ tablet Take 1 tablet (20 mEq total) by mouth daily. 30 tablet 3  . PRADAXA 75 MG CAPS capsule Take 75 mg by mouth 2 (two) times daily.    . RESTASIS 0.05 % ophthalmic  emulsion Place 1 drop into both eyes 2 (two) times daily.  12  . TRULICITY 7.07 EM/7.5QG SOPN      No facility-administered medications prior to visit.    Allergies  Allergen Reactions  . Sulfa Antibiotics Itching    ROS Review of Systems  Constitutional: Positive for fatigue. Negative for activity change.  Respiratory: Positive  for shortness of breath.        With excertion  Cardiovascular: Positive for chest pain and palpitations.  Gastrointestinal: Positive for abdominal pain.  Musculoskeletal: Positive for arthralgias.  Neurological: Positive for weakness and headaches.  Psychiatric/Behavioral: Positive for sleep disturbance. The patient is nervous/anxious.   All other systems reviewed and are negative.     Objective:    Physical Exam  Constitutional: She is oriented to person, place, and time. She appears well-developed and well-nourished.  obese  HENT:  Head: Normocephalic.  Cardiovascular: Normal rate and regular rhythm.  Pulmonary/Chest: Effort normal and breath sounds normal.  Abdominal: Soft. Bowel sounds are normal. She exhibits distension. There is abdominal tenderness.  Musculoskeletal:        General: Normal range of motion.     Cervical back: Normal range of motion and neck supple.  Neurological: She is oriented to person, place, and time.  Skin: Skin is warm. Rash noted.  Under left lower rib back  Psychiatric: She has a normal mood and affect.    BP (!) 158/118 (BP Location: Right Arm, Patient Position: Sitting, Cuff Size: Normal)   Pulse 73   Temp (!) 97.3 F (36.3 C) (Oral)   Resp 18   Wt 296 lb (134.3 kg)   SpO2 97%   BMI 46.36 kg/m  Wt Readings from Last 3 Encounters:  09/04/19 269 lb (122 kg)  09/04/19 296 lb (134.3 kg)  06/04/19 287 lb 9.6 oz (130.5 kg)     Health Maintenance Due  Topic Date Due  . OPHTHALMOLOGY EXAM  07/23/1966  . PAP SMEAR-Modifier  07/23/1977  . MAMMOGRAM  07/23/2006    There are no preventive care reminders  to display for this patient.  Lab Results  Component Value Date   TSH 4.210 03/04/2019   Lab Results  Component Value Date   WBC 6.3 09/04/2019   HGB 15.0 09/04/2019   HCT 45.2 09/04/2019   MCV 90.2 09/04/2019   PLT 248 09/04/2019   Lab Results  Component Value Date   NA 136 09/04/2019   K 3.5 09/04/2019   CO2 31 09/04/2019   GLUCOSE 381 (H) 09/04/2019   BUN 23 09/04/2019   CREATININE 1.78 (H) 09/04/2019   BILITOT 1.1 09/04/2019   ALKPHOS 71 09/04/2019   AST 20 09/04/2019   ALT 17 09/04/2019   PROT 7.9 09/04/2019   ALBUMIN 3.4 (L) 09/04/2019   CALCIUM 10.0 09/04/2019   ANIONGAP 13 09/04/2019   Lab Results  Component Value Date   CHOL 106 12/30/2017   Lab Results  Component Value Date   HDL 34 (L) 12/30/2017   Lab Results  Component Value Date   LDLCALC 52 12/30/2017   Lab Results  Component Value Date   TRIG 102 12/30/2017   Lab Results  Component Value Date   CHOLHDL 3.1 12/30/2017   Lab Results  Component Value Date   HGBA1C 9.8 (A) 06/04/2019      Assessment & Plan:  Cleola was seen today for follow-up.  Diagnoses and all orders for this visit:  Encounter for diabetic foot exam [facility]) Sensory exam of the foot is normal, tested with the monofilament. Good pulses, no lesions or ulcers, good peripheral pulses. -     Ambulatory referral to Ophthalmology  Chest pain, unspecified type If signs of heaviness in center of chest left side numbness neck, shoulder , arm or back call 911 and take a '325mg'$  ASA and NTG sl -     Ambulatory  referral to Cardiology -     aspirin tablet 325 mg -     ondansetron (ZOFRAN-ODT) disintegrating tablet 4 mg -     nitroGLYCERIN (NITROSTAT) SL tablet 0.4 mg  Loss of weight -     TSH + free T4  Sent out 911 chest pain gave zofran '4mg'$  c/o nausea , '325mg'$  of ASA  Meds ordered this encounter  Medications  . aspirin tablet 325 mg  . ondansetron (ZOFRAN-ODT) disintegrating tablet 4 mg  . nitroGLYCERIN (NITROSTAT) SL  tablet 0.4 mg    Follow-up: No follow-ups on file.    [name], NP

## 2019-09-04 NOTE — ED Provider Notes (Signed)
[facility] EMERGENCY DEPARTMENT Provider Note   CSN: [id number] Arrival date & time: 09/04/19  1045     History Chief Complaint  Patient presents with  . Chest Pain  . Abdominal Pain  . Nausea    [name] is a 64 y.o. female with CHF, non-obstructive CAD, insulin dependent DM, A.fib on Pradaxa, COPD who presents with multiple complaints. She states her symptoms started a couple of days ago. Symptoms are constant but waxing and waning in severity. She reports left sided sharp chest pain. It's hard to lie on the left side at night because it causes more pain. Sitting forward makes it worse. She has been taking Tylenol for her symptoms without significant relief. She reports associated frontal headache and lightheadedness. At times she will feel like she is going to pass out. She also has had some SOB with exertion. She also endorses abdominal pain which is over the upper left side. It is constant and non-radiating. It has been going on for weeks - she thinks right before Xmas and is getting worse. She has had nausea but no vomiting, diarrhea, constipation, or urinary symptoms. She went to her PCP's office today and they noted her BP was high and she told them she had chest pain so they sent her to the ED. She was given nitro and ASA without improvement of her pain. Has frequent presentations for atypical chest pain - has been admitted for chest pain obs a couple times.  HPI     Past Medical History:  Diagnosis Date  . Allergic rhinitis   . Arthritis   . Asthma   . Chest pain 12/27/2017  . Chronic diastolic CHF (congestive heart failure) ([place])   . COPD (chronic obstructive pulmonary disease) ([place])   . Depression   . DM (diabetes mellitus) ([place])   . DVT (deep vein thrombosis) in pregnancy   . Dysrhythmia    atrial fibrilation  . GERD (gastroesophageal reflux disease)   . Headache(784.0)   . HTN (hypertension)   . Hyperlipidemia   . Obesity   .  Pneumonia 04/2018   RIGHT LOBE  . Shortness of breath   . Sleep apnea    compliant with CPAP    Patient Active Problem List   Diagnosis Date Noted  . HHNC (hyperglycemic hyperosmolar nonketotic coma) ([place]) 02/21/2019  . Hypokalemia 02/21/2019  . Acute kidney injury superimposed on CKD ([place]) 02/21/2019  . Acute on chronic left systolic heart failure ([place]) 07/01/2018  . Congestive heart failure with left ventricular diastolic dysfunction, acute ([place]) 07/01/2018  . Right upper lobe pneumonia 05/14/2018  . Atrial fibrillation with RVR ([place]) 05/14/2018  . Chronic atrial fibrillation ([place]) 12/26/2017  . Diastolic dysfunction 38/75/6433  . Diabetes mellitus type 2 in obese ([place]) 12/26/2017  . Nausea vomiting and diarrhea   . Chest pain 02/26/2012  . OSA (obstructive sleep apnea) 09/21/2011  . Hypoxia 09/21/2011  . Diabetes mellitus ([place]) 02/04/2010  . Morbid obesity ([place]) 02/04/2010  . Atrial fibrillation ([place]) 02/04/2010  . Gastroparesis 02/04/2010  . GASTROENTERITIS 02/04/2010    Past Surgical History:  Procedure Laterality Date  . ABDOMINAL HYSTERECTOMY    . CATARACT EXTRACTION, BILATERAL    . CHOLECYSTECTOMY    . COLONOSCOPY WITH PROPOFOL N/A 03/05/2015   Procedure: COLONOSCOPY WITH PROPOFOL;  Surgeon: Carol Ada, MD;  Location: [facility];  Service: Endoscopy;  Laterality: N/A;  . DIAGNOSTIC LAPAROSCOPY    . ingrown hallux Left   . KNEE SURGERY    .  LEFT HEART CATH AND CORONARY ANGIOGRAPHY N/A 12/31/2017   Procedure: LEFT HEART CATH AND CORONARY ANGIOGRAPHY;  Surgeon: Charolette Forward, MD;  Location: [facility];  Service: Cardiovascular;  Laterality: N/A;     OB History   No obstetric history on file.     Family History  Problem Relation Age of Onset  . Cancer Mother   . Hypertension Mother   . Cancer Father   . CAD Other   . Hypertension Sister   . Hypertension Brother     Social History   Tobacco Use  . Smoking status: Never Smoker  . Smokeless  tobacco: Never Used  Substance Use Topics  . Alcohol use: No  . Drug use: No    Home Medications Prior to Admission medications   Medication Sig Start Date End Date Taking? Authorizing Provider  Accu-Chek FastClix Lancets MISC Use as directed three times daily 03/10/19   [name], NP  albuterol (VENTOLIN HFA) 108 [id number] Base) MCG/ACT inhaler  08/15/19   [provider]  Alcohol Swabs (B-D SINGLE USE SWABS REGULAR) PADS  02/19/19   [provider]  Blood Glucose Monitoring Suppl (TRUE METRIX METER) w/Device KIT Use to check blood sugar daily. 03/10/19   Charlott Rakes, MD  digoxin (LANOXIN) 0.125 MG tablet Take 1 tablet (125 mcg total) by mouth daily. 07/04/18 07/04/19  Charolette Forward, MD  DULoxetine (CYMBALTA) 60 MG capsule Take 1 capsule (60 mg total) by mouth daily. 05/20/18   Charolette Forward, MD  furosemide (LASIX) 40 MG tablet Take 1 tablet (40 mg total) by mouth daily. 02/24/19 02/24/20  Shelly Coss, MD  gabapentin (NEURONTIN) 300 MG capsule Take 300 mg by mouth 4 (four) times daily.  09/13/11   [provider]  glucose blood (TRUE METRIX BLOOD GLUCOSE TEST) test strip Use as instructed to check blood sugar daily. 03/10/19   Charlott Rakes, MD  Insulin Syringe-Needle U-100 (TRUEPLUS INSULIN SYRINGE) 31G X 5/16" 0.3 ML MISC Use to administer insulin every day. 03/24/19   Newlin, Charlane Ferretti, MD  LEVEMIR 100 UNIT/ML injection INJECT 0.4 MLS (40 UNITS TOTAL) INTO THE SKIN 2 (TWO) TIMES DAILY. 08/07/19   [name], NP  Melatonin 5 MG CAPS Take 1 capsule (5 mg total) by mouth at bedtime as needed. 03/04/19   [name], NP  metolazone (ZAROXOLYN) 5 MG tablet Take 5 mg by mouth daily. 06/01/19   [provider]  metoprolol tartrate (LOPRESSOR) 25 MG tablet Take 1 tablet (25 mg total) by mouth 2 (two) times daily. 02/24/19   Shelly Coss, MD  Multiple Vitamins-Minerals (MULTIVITAMIN WOMEN PO) Take 1 tablet by mouth daily.    [provider]  nitroGLYCERIN (NITROSTAT) 0.4 MG SL tablet Place 1 tablet (0.4 mg total) under the tongue every 5 (five) minutes x 3 doses as needed for chest pain. 01/08/14   Charolette Forward, MD  NOVOLOG 100 UNIT/ML injection INJECT 8 UNITS INTO THE SKIN 3 (THREE) TIMES DAILY WITH MEALS. TAKE ONLY WHEN YOUR EAT MORE THAN 50% OF YOUR MEALS 09/02/19   Charlott Rakes, MD  pantoprazole (PROTONIX) 40 MG tablet Take 1 tablet (40 mg total) by mouth 2 (two) times daily. 01/01/18   Regalado, Belkys A, MD  potassium chloride SA (K-DUR,KLOR-CON) 20 MEQ tablet Take 1 tablet (20 mEq total) by mouth daily. 07/04/18   Charolette Forward, MD  PRADAXA 75 MG CAPS capsule Take 75 mg by mouth 2 (two) times daily. 10/23/18   [provider]  RESTASIS 0.05 % ophthalmic emulsion Place 1 drop into both eyes 2 (two) times daily. 11/19/17   [provider]  TRULICITY 8.54 OE/7.0JJ SOPN  08/12/19   [provider]    Allergies    Sulfa antibiotics  Review of Systems   Review of Systems  Constitutional: Positive for chills. Negative for fever.  Respiratory: Positive for shortness of breath. Negative for cough and wheezing.   Cardiovascular: Positive for chest pain. Negative for palpitations and leg swelling.  Gastrointestinal: Positive for abdominal pain and nausea. Negative for blood in stool, constipation, diarrhea and vomiting.  Genitourinary: Negative for dysuria, flank pain, hematuria and pelvic pain.  Musculoskeletal: Positive for back pain.  Neurological: Positive for light-headedness and headaches. Negative for syncope.  Hematological: Bruises/bleeds easily.  All other systems reviewed and are negative.   Physical Exam Updated Vital Signs BP (!) 143/103 (BP Location: Right Arm)   Pulse (!) 102   Temp 98.6 F (37 C) (Oral)   Resp 16   Ht _0  (1.676 m)   Wt 122 kg   SpO2 96%   BMI 43.42 kg/m   Physical Exam Vitals and nursing note reviewed.  Constitutional:      General: She is  not in acute distress.    Appearance: Normal appearance. She is well-developed. She is obese. She is not ill-appearing.  HENT:     Head: Normocephalic and atraumatic.  Eyes:     General: No scleral icterus.       Right eye: No discharge.        Left eye: No discharge.     Conjunctiva/sclera: Conjunctivae normal.     Pupils: Pupils are equal, round, and reactive to light.  Cardiovascular:     Rate and Rhythm: Normal rate. Rhythm irregularly irregular.  Pulmonary:     Effort: Pulmonary effort is normal. No respiratory distress.     Breath sounds: Normal breath sounds.  Abdominal:     General: There is no distension.     Palpations: Abdomen is soft.     Tenderness: There is abdominal tenderness (mild LUQ and LLQ).  Musculoskeletal:     Cervical back: Normal range of motion.  Skin:    General: Skin is warm and dry.  Neurological:     Mental Status: She is alert and oriented to person, place, and time.  Psychiatric:        Mood and Affect: Mood is anxious.        Behavior: Behavior normal.     ED Results / Procedures / Treatments   Labs (all labs ordered are listed, but only abnormal results are displayed) Labs Reviewed  COMPREHENSIVE METABOLIC PANEL - Abnormal; Notable for the following components:      Result Value   Chloride 92 (*)    Glucose, Bld 381 (*)    Creatinine, Ser 1.78 (*)    Albumin 3.4 (*)    GFR calc non Af Amer 30 (*)    GFR calc Af Amer 35 (*)    All other components within normal limits  CBG MONITORING, ED - Abnormal; Notable for the following components:   Glucose-Capillary 317 (*)    All other components within normal limits  NOVEL CORONAVIRUS, NAA (HOSP ORDER, SEND-OUT TO REF LAB; TAT 18-24 HRS)  CBC WITH DIFFERENTIAL/PLATELET  LIPASE, BLOOD  BRAIN NATRIURETIC PEPTIDE  TROPONIN I (HIGH SENSITIVITY)  TROPONIN I (HIGH SENSITIVITY)    EKG EKG Interpretation  Date/Time:  Thursday September 04 2019 10:51:11 EST Ventricular  Rate:  98 PR Interval:     QRS Duration: 102 QT Interval:  349 QTC Calculation: 446 R Axis:   147 Text Interpretation: Atrial fibrillation Right axis deviation Nonspecific T abnrm, anterolateral leads No acute changes No significant change since last tracing Confirmed by Varney Biles [phone]) on 09/04/2019 1:13:21 PM   Radiology DG Chest Port 1 View  Result Date: 09/04/2019 CLINICAL DATA:  Shortness of breath, chest pain EXAM: PORTABLE CHEST 1 VIEW COMPARISON:  02/21/2019 FINDINGS: The heart size and mediastinal contours are within normal limits. Both lungs are clear. The visualized skeletal structures are unremarkable. IMPRESSION: No active disease. Electronically Signed   By: Kathreen Devoid   On: 09/04/2019 12:34    Procedures Procedures (including critical care time)  Medications Ordered in ED Medications  ondansetron (ZOFRAN) injection 4 mg (4 mg Intravenous Given 09/04/19 1204)  morphine 2 MG/ML injection 2 mg (2 mg Intravenous Given 09/04/19 1206)  sodium chloride 0.9 % bolus 500 mL (0 mLs Intravenous Stopped 09/04/19 1353)  insulin aspart (novoLOG) injection 10 Units (10 Units Subcutaneous Given 09/04/19 1302)  alum & mag hydroxide-simeth (MAALOX/MYLANTA) 200-200-20 MG/5ML suspension 30 mL (30 mLs Oral Given 09/04/19 1352)    ED Course  I have reviewed the triage vital signs and the nursing notes.  Pertinent labs & imaging results that were available during my care of the patient were reviewed by me and considered in my medical decision making (see chart for details).  64 year old female presents with multiple symptoms she has an almost pan-positive ROS. BP is elevated here but otherwise vitals are reassuring. EKG shows rate controlled A.fib. Chest pain is reproducible with palpation. She has mild epigastric, LUQ, LLQ tenderness. Will order labs, CXR, UA. Will give fluids, morphine, zofran.  CBC is normal. CMP is remarkable for hyperglycemia (381). She is known to have uncontrolled DM. Kidney function is  around baseline. She states she is scheduled to see a kidney doctor later this month. BNP is normal. Trop is normal. Discussed results with pt that pain is non-cardiac. She now focuses on her abdominal pain - she does reportedly have GERD but she feels that her symptoms aren't GERD. Will obtain CT A/P although I have a low suspicion for acute abnormality. Will add GI cocktail.  Shared visit with Dr. Kathrynn Humble - he saw pt and feels she does not need CT and symptoms are consistent with gastritis. Also could be gastroparesis from her uncontrolled DM. 2nd trop is normal. She has tolerated PO here. Repeat CBG is improved but still high. COVID test was ordered as outpatient. Rx for Carafate given.   MDM Rules/Calculators/A&P  Final Clinical Impression(s) / ED Diagnoses Final diagnoses:  Atypical chest pain  LUQ abdominal pain  Uncontrolled other specified diabetes mellitus with hyperglycemia [facility])    Rx / DC Orders ED Discharge Orders    None       Recardo Evangelist, PA-C 09/04/19 [place], [place], MD 09/05/19 [phone]

## 2019-09-04 NOTE — ED Notes (Signed)
Pt given sandwich bag and diet coke

## 2019-09-04 NOTE — Progress Notes (Signed)
Patient complains of intermittent left side pain when deep breathing.

## 2019-09-04 NOTE — ED Triage Notes (Signed)
Pt sent by PCP office with ems for c.o chest pain x2days. Pt went to PCP for A1C check which was 13. Pt told the doctor she had been having chest pain so they checked her BP (158/118) and sent her here for further evaluation. Pt also c.o abd pain and nausea. Pt given 1 nitro, 325mg  ASA and 4mg  zofran at office prior to ems transport. Pt in a fib on the monitor with hx of same. VSS, pt a.o, pain 9/10 at this time

## 2019-09-04 NOTE — Discharge Instructions (Addendum)
Start Carafate and take 3 times daily and once before bedtime for possible gastritis (stomach inflammation). Take it regularly for 10 days to see if this improves your abdominal pain Please follow up with your PCP for hospital follow up Also follow up with [name] (GI doctor)

## 2019-09-05 LAB — CBC WITH DIFFERENTIAL/PLATELET
Basophils Absolute: 0.1 10*3/uL (ref 0.0–0.2)
Basos: 1 %
EOS (ABSOLUTE): 0.2 10*3/uL (ref 0.0–0.4)
Eos: 2 %
Hematocrit: 47.2 % — ABNORMAL HIGH (ref 34.0–46.6)
Hemoglobin: 15.5 g/dL (ref 11.1–15.9)
Immature Grans (Abs): 0 10*3/uL (ref 0.0–0.1)
Immature Granulocytes: 0 %
Lymphocytes Absolute: 2.6 10*3/uL (ref 0.7–3.1)
Lymphs: 33 %
MCH: 29.9 pg (ref 26.6–33.0)
MCHC: 32.8 g/dL (ref 31.5–35.7)
MCV: 91 fL (ref 79–97)
Monocytes Absolute: 0.9 10*3/uL (ref 0.1–0.9)
Monocytes: 11 %
Neutrophils Absolute: 4.1 10*3/uL (ref 1.4–7.0)
Neutrophils: 53 %
Platelets: 254 10*3/uL (ref 150–450)
RBC: 5.19 x10E6/uL (ref 3.77–5.28)
RDW: 12.3 % (ref 11.7–15.4)
WBC: 7.8 10*3/uL (ref 3.4–10.8)

## 2019-09-05 LAB — CMP14+EGFR
ALT: 14 IU/L (ref 0–32)
AST: 18 IU/L (ref 0–40)
Albumin/Globulin Ratio: 1 — ABNORMAL LOW (ref 1.2–2.2)
Albumin: 4.1 g/dL (ref 3.8–4.8)
Alkaline Phosphatase: 98 IU/L (ref 39–117)
BUN/Creatinine Ratio: 14 (ref 12–28)
BUN: 25 mg/dL (ref 8–27)
Bilirubin Total: 0.6 mg/dL (ref 0.0–1.2)
CO2: 29 mmol/L (ref 20–29)
Calcium: 10.6 mg/dL — ABNORMAL HIGH (ref 8.7–10.3)
Chloride: 91 mmol/L — ABNORMAL LOW (ref 96–106)
Creatinine, Ser: 1.75 mg/dL — ABNORMAL HIGH (ref 0.57–1.00)
GFR calc Af Amer: 35 mL/min/{1.73_m2} — ABNORMAL LOW (ref >59)
GFR calc non Af Amer: 31 mL/min/{1.73_m2} — ABNORMAL LOW (ref >59)
Globulin, Total: 4 g/dL (ref 1.5–4.5)
Glucose: 377 mg/dL — ABNORMAL HIGH (ref 65–99)
Potassium: 3.8 mmol/L (ref 3.5–5.2)
Sodium: 139 mmol/L (ref 134–144)
Total Protein: 8.1 g/dL (ref 6.0–8.5)

## 2019-09-05 LAB — TSH+FREE T4
Free T4: 1.19 ng/dL (ref 0.82–1.77)
TSH: 3.11 u[IU]/mL (ref 0.450–4.500)

## 2019-09-05 LAB — LIPID PANEL
Chol/HDL Ratio: 5 ratio — ABNORMAL HIGH (ref 0.0–4.4)
Cholesterol, Total: 234 mg/dL — ABNORMAL HIGH (ref 100–199)
HDL: 47 mg/dL (ref >39)
LDL Chol Calc (NIH): 161 mg/dL — ABNORMAL HIGH (ref 0–99)
Triglycerides: 143 mg/dL (ref 0–149)
VLDL Cholesterol Cal: 26 mg/dL (ref 5–40)

## 2019-09-05 LAB — NOVEL CORONAVIRUS, NAA (HOSP ORDER, SEND-OUT TO REF LAB; TAT 18-24 HRS): SARS-CoV-2, NAA: NOT DETECTED

## 2019-09-10 DIAGNOSIS — G4733 Obstructive sleep apnea (adult) (pediatric): Secondary | ICD-10-CM | POA: Diagnosis not present

## 2019-09-10 NOTE — Progress Notes (Signed)
Subjective: [name] presents today for preventative diabetic foot care. Patient is seen for follow up of painful, mycotic toenails which interfere with comfortable ambulation when wearing enclosed shoe gear. Pain is relieved with periodic professional debridement.  Medications reviewed in chart.  Allergies  Allergen Reactions  . Sulfa Antibiotics Itching    Objective: There were no vitals filed for this visit.  Vascular Examination: Capillary refill time immediate x 10 digits.  Dorsalis pedis 2/4 bilaterally.  Posterior tibial pulses 0/4 bilaterally.  Digital hair absent bilaterally.  Skin temperature gradient WNL b/l.   She has evidence of chronic venous insufficiency.  There is lower extremity pedal edema noted bilaterally.  No increased warmth bilaterally.  No pain with calf compression bilaterally.  Dermatological Examination: Skin with normal turgor, texture and tone b/l.  Toenails 1-5 b/l discolored, thick, dystrophic with subungual debris and pain with palpation to nailbeds due to thickness of nails.  Musculoskeletal: Muscle strength 5/5 b/l to all LE muscle groups.  No pain, crepitus or joint limitation with passive/active ROM b/l.  Neurological Examination: Protective sensation diminished with 10 g monofilament bilaterally.  Vibratory sensation diminished bilaterally. Assessment: 1. Painful onychomycosis toenails 1-5 b/l 2. NIDDM with neuropathy  Plan: 1. Continue diabetic foot care principles. Literature dispensed on today. 2. Toenails 1-5 b/l were debrided in length and girth without iatrogenic bleeding. 3. Patient to continue soft, supportive shoe gear. 4. Patient to report any pedal injuries to medical professional. 5. Follow up 3 months.  6. Patient/POA to call should there be a concern in the interim.

## 2019-09-15 ENCOUNTER — Encounter: Payer: Self-pay | Admitting: General Practice

## 2019-09-17 DIAGNOSIS — R0781 Pleurodynia: Secondary | ICD-10-CM | POA: Diagnosis not present

## 2019-09-17 DIAGNOSIS — R634 Abnormal weight loss: Secondary | ICD-10-CM | POA: Diagnosis not present

## 2019-09-17 DIAGNOSIS — E119 Type 2 diabetes mellitus without complications: Secondary | ICD-10-CM | POA: Diagnosis not present

## 2019-09-21 ENCOUNTER — Other Ambulatory Visit: Payer: Self-pay | Admitting: Family Medicine

## 2019-09-21 DIAGNOSIS — E1169 Type 2 diabetes mellitus with other specified complication: Secondary | ICD-10-CM

## 2019-09-24 ENCOUNTER — Emergency Department (HOSPITAL_COMMUNITY): Payer: Medicare HMO

## 2019-09-24 ENCOUNTER — Encounter (INDEPENDENT_AMBULATORY_CARE_PROVIDER_SITE_OTHER): Payer: Self-pay | Admitting: Primary Care

## 2019-09-24 ENCOUNTER — Inpatient Hospital Stay (HOSPITAL_COMMUNITY)
Admission: EM | Admit: 2019-09-24 | Discharge: 2019-09-26 | DRG: 308 | Disposition: A | Discharge status: Home Health | Payer: Medicare HMO | Attending: Internal Medicine | Admitting: Internal Medicine

## 2019-09-24 ENCOUNTER — Ambulatory Visit (INDEPENDENT_AMBULATORY_CARE_PROVIDER_SITE_OTHER)
Admission: EM | Admit: 2019-09-24 | Discharge: 2019-09-24 | Disposition: A | Discharge status: Home / Self Care | Payer: Medicare HMO | Source: Home / Self Care

## 2019-09-24 ENCOUNTER — Other Ambulatory Visit: Payer: Self-pay

## 2019-09-24 ENCOUNTER — Ambulatory Visit (INDEPENDENT_AMBULATORY_CARE_PROVIDER_SITE_OTHER): Payer: Medicare HMO | Admitting: Primary Care

## 2019-09-24 ENCOUNTER — Encounter (HOSPITAL_COMMUNITY): Payer: Self-pay | Admitting: Emergency Medicine

## 2019-09-24 DIAGNOSIS — J449 Chronic obstructive pulmonary disease, unspecified: Secondary | ICD-10-CM | POA: Diagnosis present

## 2019-09-24 DIAGNOSIS — R110 Nausea: Secondary | ICD-10-CM | POA: Diagnosis not present

## 2019-09-24 DIAGNOSIS — N179 Acute kidney failure, unspecified: Secondary | ICD-10-CM | POA: Diagnosis not present

## 2019-09-24 DIAGNOSIS — S0990XA Unspecified injury of head, initial encounter: Secondary | ICD-10-CM | POA: Diagnosis not present

## 2019-09-24 DIAGNOSIS — Z6841 Body Mass Index (BMI) 40.0 and over, adult: Secondary | ICD-10-CM

## 2019-09-24 DIAGNOSIS — E1110 Type 2 diabetes mellitus with ketoacidosis without coma: Secondary | ICD-10-CM | POA: Diagnosis not present

## 2019-09-24 DIAGNOSIS — R1013 Epigastric pain: Secondary | ICD-10-CM | POA: Diagnosis present

## 2019-09-24 DIAGNOSIS — F329 Major depressive disorder, single episode, unspecified: Secondary | ICD-10-CM | POA: Diagnosis present

## 2019-09-24 DIAGNOSIS — N189 Chronic kidney disease, unspecified: Secondary | ICD-10-CM

## 2019-09-24 DIAGNOSIS — I4891 Unspecified atrial fibrillation: Secondary | ICD-10-CM | POA: Diagnosis not present

## 2019-09-24 DIAGNOSIS — E785 Hyperlipidemia, unspecified: Secondary | ICD-10-CM | POA: Diagnosis present

## 2019-09-24 DIAGNOSIS — M1990 Unspecified osteoarthritis, unspecified site: Secondary | ICD-10-CM | POA: Diagnosis present

## 2019-09-24 DIAGNOSIS — I482 Chronic atrial fibrillation, unspecified: Secondary | ICD-10-CM | POA: Diagnosis not present

## 2019-09-24 DIAGNOSIS — Z882 Allergy status to sulfonamides status: Secondary | ICD-10-CM

## 2019-09-24 DIAGNOSIS — E1122 Type 2 diabetes mellitus with diabetic chronic kidney disease: Secondary | ICD-10-CM | POA: Diagnosis present

## 2019-09-24 DIAGNOSIS — N183 Chronic kidney disease, stage 3 unspecified: Secondary | ICD-10-CM | POA: Diagnosis present

## 2019-09-24 DIAGNOSIS — E669 Obesity, unspecified: Secondary | ICD-10-CM

## 2019-09-24 DIAGNOSIS — I130 Hypertensive heart and chronic kidney disease with heart failure and stage 1 through stage 4 chronic kidney disease, or unspecified chronic kidney disease: Secondary | ICD-10-CM | POA: Diagnosis not present

## 2019-09-24 DIAGNOSIS — S82891A Other fracture of right lower leg, initial encounter for closed fracture: Secondary | ICD-10-CM | POA: Diagnosis not present

## 2019-09-24 DIAGNOSIS — J309 Allergic rhinitis, unspecified: Secondary | ICD-10-CM | POA: Diagnosis present

## 2019-09-24 DIAGNOSIS — Y92002 Bathroom of unspecified non-institutional (private) residence single-family (private) house as the place of occurrence of the external cause: Secondary | ICD-10-CM | POA: Diagnosis not present

## 2019-09-24 DIAGNOSIS — Z20822 Contact with and (suspected) exposure to covid-19: Secondary | ICD-10-CM | POA: Diagnosis present

## 2019-09-24 DIAGNOSIS — R739 Hyperglycemia, unspecified: Secondary | ICD-10-CM | POA: Diagnosis present

## 2019-09-24 DIAGNOSIS — G4733 Obstructive sleep apnea (adult) (pediatric): Secondary | ICD-10-CM | POA: Diagnosis present

## 2019-09-24 DIAGNOSIS — W1839XA Other fall on same level, initial encounter: Secondary | ICD-10-CM | POA: Diagnosis present

## 2019-09-24 DIAGNOSIS — Z03818 Encounter for observation for suspected exposure to other biological agents ruled out: Secondary | ICD-10-CM | POA: Diagnosis not present

## 2019-09-24 DIAGNOSIS — Z09 Encounter for follow-up examination after completed treatment for conditions other than malignant neoplasm: Secondary | ICD-10-CM | POA: Diagnosis not present

## 2019-09-24 DIAGNOSIS — I5189 Other ill-defined heart diseases: Secondary | ICD-10-CM | POA: Diagnosis not present

## 2019-09-24 DIAGNOSIS — R531 Weakness: Secondary | ICD-10-CM | POA: Diagnosis not present

## 2019-09-24 DIAGNOSIS — R61 Generalized hyperhidrosis: Secondary | ICD-10-CM

## 2019-09-24 DIAGNOSIS — I5032 Chronic diastolic (congestive) heart failure: Secondary | ICD-10-CM | POA: Diagnosis present

## 2019-09-24 DIAGNOSIS — Z794 Long term (current) use of insulin: Secondary | ICD-10-CM

## 2019-09-24 DIAGNOSIS — K219 Gastro-esophageal reflux disease without esophagitis: Secondary | ICD-10-CM | POA: Diagnosis present

## 2019-09-24 DIAGNOSIS — R079 Chest pain, unspecified: Secondary | ICD-10-CM

## 2019-09-24 DIAGNOSIS — S8254XA Nondisplaced fracture of medial malleolus of right tibia, initial encounter for closed fracture: Secondary | ICD-10-CM | POA: Diagnosis not present

## 2019-09-24 DIAGNOSIS — R0602 Shortness of breath: Secondary | ICD-10-CM | POA: Diagnosis not present

## 2019-09-24 DIAGNOSIS — E860 Dehydration: Secondary | ICD-10-CM | POA: Diagnosis present

## 2019-09-24 DIAGNOSIS — R55 Syncope and collapse: Secondary | ICD-10-CM | POA: Diagnosis not present

## 2019-09-24 DIAGNOSIS — R0902 Hypoxemia: Secondary | ICD-10-CM | POA: Diagnosis not present

## 2019-09-24 DIAGNOSIS — Z86718 Personal history of other venous thrombosis and embolism: Secondary | ICD-10-CM | POA: Diagnosis not present

## 2019-09-24 DIAGNOSIS — R000 Tachycardia, unspecified: Secondary | ICD-10-CM | POA: Diagnosis present

## 2019-09-24 DIAGNOSIS — E1169 Type 2 diabetes mellitus with other specified complication: Secondary | ICD-10-CM | POA: Diagnosis present

## 2019-09-24 DIAGNOSIS — M25571 Pain in right ankle and joints of right foot: Secondary | ICD-10-CM | POA: Diagnosis not present

## 2019-09-24 DIAGNOSIS — Z79899 Other long term (current) drug therapy: Secondary | ICD-10-CM

## 2019-09-24 DIAGNOSIS — I499 Cardiac arrhythmia, unspecified: Secondary | ICD-10-CM | POA: Diagnosis not present

## 2019-09-24 DIAGNOSIS — E119 Type 2 diabetes mellitus without complications: Secondary | ICD-10-CM | POA: Diagnosis present

## 2019-09-24 DIAGNOSIS — E1165 Type 2 diabetes mellitus with hyperglycemia: Secondary | ICD-10-CM | POA: Diagnosis not present

## 2019-09-24 DIAGNOSIS — Z8249 Family history of ischemic heart disease and other diseases of the circulatory system: Secondary | ICD-10-CM

## 2019-09-24 LAB — CBC WITH DIFFERENTIAL/PLATELET
Abs Immature Granulocytes: 0.05 10*3/uL (ref 0.00–0.07)
Basophils Absolute: 0 10*3/uL (ref 0.0–0.1)
Basophils Relative: 0 %
Eosinophils Absolute: 0.1 10*3/uL (ref 0.0–0.5)
Eosinophils Relative: 1 %
HCT: 44.8 % (ref 36.0–46.0)
Hemoglobin: 15.6 g/dL — ABNORMAL HIGH (ref 12.0–15.0)
Immature Granulocytes: 0 %
Lymphocytes Relative: 17 %
Lymphs Abs: 1.9 10*3/uL (ref 0.7–4.0)
MCH: 30.5 pg (ref 26.0–34.0)
MCHC: 34.8 g/dL (ref 30.0–36.0)
MCV: 87.5 fL (ref 80.0–100.0)
Monocytes Absolute: 1.4 10*3/uL — ABNORMAL HIGH (ref 0.1–1.0)
Monocytes Relative: 12 %
Neutro Abs: 7.9 10*3/uL — ABNORMAL HIGH (ref 1.7–7.7)
Neutrophils Relative %: 70 %
Platelets: 206 10*3/uL (ref 150–400)
RBC: 5.12 MIL/uL — ABNORMAL HIGH (ref 3.87–5.11)
RDW: 12.3 % (ref 11.5–15.5)
WBC: 11.3 10*3/uL — ABNORMAL HIGH (ref 4.0–10.5)
nRBC: 0 % (ref 0.0–0.2)

## 2019-09-24 LAB — COMPREHENSIVE METABOLIC PANEL
ALT: 17 U/L (ref 0–44)
AST: 16 U/L (ref 15–41)
Albumin: 3.2 g/dL — ABNORMAL LOW (ref 3.5–5.0)
Alkaline Phosphatase: 77 U/L (ref 38–126)
Anion gap: 17 — ABNORMAL HIGH (ref 5–15)
BUN: 28 mg/dL — ABNORMAL HIGH (ref 8–23)
CO2: 33 mmol/L — ABNORMAL HIGH (ref 22–32)
Calcium: 9 mg/dL (ref 8.9–10.3)
Chloride: 79 mmol/L — ABNORMAL LOW (ref 98–111)
Creatinine, Ser: 2.41 mg/dL — ABNORMAL HIGH (ref 0.44–1.00)
GFR calc Af Amer: 24 mL/min — ABNORMAL LOW (ref >60)
GFR calc non Af Amer: 21 mL/min — ABNORMAL LOW (ref >60)
Glucose, Bld: 672 mg/dL (ref 70–99)
Potassium: 3.1 mmol/L — ABNORMAL LOW (ref 3.5–5.1)
Sodium: 129 mmol/L — ABNORMAL LOW (ref 135–145)
Total Bilirubin: 1.4 mg/dL — ABNORMAL HIGH (ref 0.3–1.2)
Total Protein: 7.8 g/dL (ref 6.5–8.1)

## 2019-09-24 LAB — POCT I-STAT EG7
Acid-Base Excess: 10 mmol/L — ABNORMAL HIGH (ref 0.0–2.0)
Bicarbonate: 38.1 mmol/L — ABNORMAL HIGH (ref 20.0–28.0)
Calcium, Ion: 1.06 mmol/L — ABNORMAL LOW (ref 1.15–1.40)
HCT: 48 % — ABNORMAL HIGH (ref 36.0–46.0)
Hemoglobin: 16.3 g/dL — ABNORMAL HIGH (ref 12.0–15.0)
O2 Saturation: 84 %
Potassium: 3.2 mmol/L — ABNORMAL LOW (ref 3.5–5.1)
Sodium: 130 mmol/L — ABNORMAL LOW (ref 135–145)
TCO2: 40 mmol/L — ABNORMAL HIGH (ref 22–32)
pCO2, Ven: 62.9 mmHg — ABNORMAL HIGH (ref 44.0–60.0)
pH, Ven: 7.39 (ref 7.250–7.430)
pO2, Ven: 51 mmHg — ABNORMAL HIGH (ref 32.0–45.0)

## 2019-09-24 LAB — POCT FASTING CBG KUC MANUAL ENTRY: POCT Glucose (KUC): 600 mg/dL — AB (ref 70–99)

## 2019-09-24 LAB — DIGOXIN LEVEL: Digoxin Level: 1 ng/mL (ref 0.8–2.0)

## 2019-09-24 LAB — POCT URINALYSIS DIP (MANUAL ENTRY)
Bilirubin, UA: NEGATIVE
Glucose, UA: 500 mg/dL — AB
Ketones, POC UA: NEGATIVE mg/dL
Leukocytes, UA: NEGATIVE
Nitrite, UA: NEGATIVE
Protein Ur, POC: 100 mg/dL — AB
Spec Grav, UA: 1.015 (ref 1.010–1.025)
Urobilinogen, UA: 1 E.U./dL
pH, UA: 6 (ref 5.0–8.0)

## 2019-09-24 LAB — BRAIN NATRIURETIC PEPTIDE: B Natriuretic Peptide: 30.3 pg/mL (ref 0.0–100.0)

## 2019-09-24 LAB — TROPONIN I (HIGH SENSITIVITY): Troponin I (High Sensitivity): 11 ng/L (ref <18)

## 2019-09-24 LAB — CBG MONITORING, ED
Glucose-Capillary: 533 mg/dL (ref 70–99)
Glucose-Capillary: 539 mg/dL (ref 70–99)
Glucose-Capillary: 304 mg/dL — ABNORMAL HIGH (ref 70–99)
Glucose-Capillary: 423 mg/dL — ABNORMAL HIGH (ref 70–99)

## 2019-09-24 LAB — BETA-HYDROXYBUTYRIC ACID: Beta-Hydroxybutyric Acid: 0.56 mmol/L — ABNORMAL HIGH (ref 0.05–0.27)

## 2019-09-24 LAB — LACTIC ACID, PLASMA: Lactic Acid, Venous: 2.7 mmol/L (ref 0.5–1.9)

## 2019-09-24 MED ORDER — INSULIN REGULAR(HUMAN) IN NACL 100-0.9 UT/100ML-% IV SOLN
INTRAVENOUS | Status: DC
  Administered 2019-09-24: 11.5 [IU]/h via INTRAVENOUS
  Administered 2019-09-25: 3.2 [IU]/h via INTRAVENOUS
  Administered 2019-09-25: 3 [IU]/h via INTRAVENOUS
  Administered 2019-09-25: 4.2 [IU]/h via INTRAVENOUS
  Administered 2019-09-25: 3.6 [IU]/h via INTRAVENOUS
  Administered 2019-09-25: 2.2 [IU]/h via INTRAVENOUS
  Filled 2019-09-24 (×2): qty 100

## 2019-09-24 MED ORDER — DILTIAZEM HCL 25 MG/5ML IV SOLN
10.0000 mg | Freq: Once | INTRAVENOUS | Status: AC
  Administered 2019-09-24: 10 mg via INTRAVENOUS
  Filled 2019-09-24: qty 5

## 2019-09-24 MED ORDER — DILTIAZEM HCL 25 MG/5ML IV SOLN
15.0000 mg | Freq: Once | INTRAVENOUS | Status: AC
  Administered 2019-09-24: 15 mg via INTRAVENOUS
  Filled 2019-09-24: qty 5

## 2019-09-24 MED ORDER — METOPROLOL TARTRATE 25 MG PO TABS
25.0000 mg | ORAL_TABLET | Freq: Once | ORAL | Status: AC
  Administered 2019-09-25: 25 mg via ORAL
  Filled 2019-09-24: qty 1

## 2019-09-24 MED ORDER — DEXTROSE-NACL 5-0.45 % IV SOLN: INTRAVENOUS | Status: DC

## 2019-09-24 MED ORDER — METOPROLOL TARTRATE 25 MG PO TABS
25.0000 mg | ORAL_TABLET | Freq: Two times a day (BID) | ORAL | Status: DC
  Administered 2019-09-25 – 2019-09-26 (×3): 25 mg via ORAL
  Filled 2019-09-24 (×3): qty 1

## 2019-09-24 MED ORDER — SODIUM CHLORIDE 0.9 % IV SOLN: INTRAVENOUS | Status: DC

## 2019-09-24 MED ORDER — SODIUM CHLORIDE 0.9 % IV BOLUS
1000.0000 mL | INTRAVENOUS | Status: AC
  Administered 2019-09-24: 1000 mL via INTRAVENOUS

## 2019-09-24 MED ORDER — POTASSIUM CHLORIDE 10 MEQ/100ML IV SOLN
10.0000 meq | INTRAVENOUS | Status: AC
  Administered 2019-09-24 – 2019-09-25 (×4): 10 meq via INTRAVENOUS
  Filled 2019-09-24 (×4): qty 100

## 2019-09-24 MED ORDER — DABIGATRAN ETEXILATE MESYLATE 75 MG PO CAPS
75.0000 mg | ORAL_CAPSULE | Freq: Two times a day (BID) | ORAL | Status: DC
  Administered 2019-09-25 – 2019-09-26 (×4): 75 mg via ORAL
  Filled 2019-09-24 (×5): qty 1

## 2019-09-24 MED ORDER — DEXTROSE 50 % IV SOLN: 0.0000 mL | INTRAVENOUS | Status: DC | PRN

## 2019-09-24 MED ORDER — ENOXAPARIN SODIUM 40 MG/0.4ML ~~LOC~~ SOLN: 40.0000 mg | Freq: Every day | SUBCUTANEOUS | Status: DC

## 2019-09-24 NOTE — ED Notes (Signed)
Pt sitting in wheel chair, diaphoretic, nausea, requesting to lay down. Pt placed in exam chair, laid back and EKG performed. Pt was cyanotic around lips, o2 97% ra. Pt placed on O2 4l/min/[place]. EKG read pulse 140-180 A-FIB RVR. EMS notified for transport. Pt a/o x4.

## 2019-09-24 NOTE — H&P (Signed)
History and Physical    [name] JJO:[id number] DOB: February 17, 1956 DOA: 09/24/2019  PCP: [name], NP  Patient coming from: Home  I have personally briefly reviewed patient's old medical records in [place]  Chief Complaint: R ankle pain  HPI: [name] is a 64 y.o. female with medical history significant of DM2, HFpEF, A.Fib on pradaxa and digoxin.  Patient presents to the ED with ongoing R ankle pain after a fall on Sunday.  Patient is diaphoretic and nauseous.  Reports taking all her meds as instructed.  Ankle pain is worse with wt bearing, constant.  Went to UC, noted to be diaphoretic, have nausea, and be in A.Fib RVR with rates between 140-180.  EMS called and patient brought to ED.   ED Course: In the ED Rate controlled with IV cardizem push.  Patient also found to have mild DKA with AG of 17 and BHB of 0.56, lactate was 2.7.  BGL 672.  Creat 2.4 up from baseline 1.7.  Venous pH however was 7.39 with TCO2 of 40.  Pt started on insulin gtt, 2L NS bolus.   Review of Systems: As per HPI, otherwise all review of systems negative.  Past Medical History:  Diagnosis Date  . Allergic rhinitis   . Arthritis   . Asthma   . Chest pain 12/27/2017  . Chronic diastolic CHF (congestive heart failure) ([place])   . COPD (chronic obstructive pulmonary disease) ([place])   . Depression   . DM (diabetes mellitus) ([place])   . DVT (deep vein thrombosis) in pregnancy   . Dysrhythmia    atrial fibrilation  . GERD (gastroesophageal reflux disease)   . Headache(784.0)   . HTN (hypertension)   . Hyperlipidemia   . Obesity   . Pneumonia 04/2018   RIGHT LOBE  . Shortness of breath   . Sleep apnea    compliant with CPAP    Past Surgical History:  Procedure Laterality Date  . ABDOMINAL HYSTERECTOMY    . CATARACT EXTRACTION, BILATERAL    . CHOLECYSTECTOMY    . COLONOSCOPY WITH PROPOFOL N/A 03/05/2015   Procedure: COLONOSCOPY WITH PROPOFOL;  Surgeon:  Carol Ada, MD;  Location: [facility];  Service: Endoscopy;  Laterality: N/A;  . DIAGNOSTIC LAPAROSCOPY    . ingrown hallux Left   . KNEE SURGERY    . LEFT HEART CATH AND CORONARY ANGIOGRAPHY N/A 12/31/2017   Procedure: LEFT HEART CATH AND CORONARY ANGIOGRAPHY;  Surgeon: Charolette Forward, MD;  Location: [facility];  Service: Cardiovascular;  Laterality: N/A;     reports that she has never smoked. She has never used smokeless tobacco. She reports that she does not drink alcohol or use drugs.  Allergies  Allergen Reactions  . Sulfa Antibiotics Itching    Family History  Problem Relation Age of Onset  . Cancer Mother   . Hypertension Mother   . Cancer Father   . CAD Other   . Hypertension Sister   . Hypertension Brother      Prior to Admission medications   Medication Sig Start Date End Date Taking? Authorizing Provider  Accu-Chek FastClix Lancets MISC Use as directed three times daily 03/10/19   [name], NP  albuterol (VENTOLIN HFA) 108 [phone] Base) MCG/ACT inhaler  08/15/19   [provider]  Alcohol Swabs (B-D SINGLE USE SWABS REGULAR) PADS  02/19/19   [provider]  Blood Glucose Monitoring Suppl (TRUE METRIX METER) w/Device KIT Use to check blood sugar daily.  03/10/19   Charlott Rakes, MD  digoxin (LANOXIN) 0.125 MG tablet Take 1 tablet (125 mcg total) by mouth daily. 07/04/18 07/04/19  Charolette Forward, MD  DULoxetine (CYMBALTA) 60 MG capsule Take 1 capsule (60 mg total) by mouth daily. 05/20/18   Charolette Forward, MD  furosemide (LASIX) 40 MG tablet Take 1 tablet (40 mg total) by mouth daily. 02/24/19 02/24/20  Shelly Coss, MD  gabapentin (NEURONTIN) 300 MG capsule Take 300 mg by mouth 4 (four) times daily.  09/13/11   [provider]  glucose blood (TRUE METRIX BLOOD GLUCOSE TEST) test strip Use as instructed to check blood sugar daily. 03/10/19   Charlott Rakes, MD  Insulin Syringe-Needle U-100 (TRUEPLUS INSULIN SYRINGE) 31G X 5/16" 0.3  ML MISC Use to administer insulin every day. 03/24/19   Newlin, Charlane Ferretti, MD  LEVEMIR 100 UNIT/ML injection INJECT 0.4 MLS (40 UNITS TOTAL) INTO THE SKIN 2 (TWO) TIMES DAILY. 08/07/19   [name], NP  losartan (COZAAR) 25 MG tablet Take 25 mg by mouth daily. 09/12/19   [provider]  Melatonin 5 MG CAPS Take 1 capsule (5 mg total) by mouth at bedtime as needed. 03/04/19   [name], NP  methylPREDNISolone (MEDROL DOSEPAK) 4 MG TBPK tablet TAKE 6 TABLETS ON DAY 1 AS DIRECTED ON PACKAGE AND DECREASE BY 1 TAB EACH DAY FOR A TOTAL OF 6 DAYS 09/17/19   [provider]  metolazone (ZAROXOLYN) 5 MG tablet Take 5 mg by mouth daily. 06/01/19   [provider]  metoprolol tartrate (LOPRESSOR) 25 MG tablet Take 1 tablet (25 mg total) by mouth 2 (two) times daily. 02/24/19   Shelly Coss, MD  Multiple Vitamins-Minerals (MULTIVITAMIN WOMEN PO) Take 1 tablet by mouth daily.    [provider]  nitroGLYCERIN (NITROSTAT) 0.4 MG SL tablet Place 1 tablet (0.4 mg total) under the tongue every 5 (five) minutes x 3 doses as needed for chest pain. 01/08/14   Charolette Forward, MD  NOVOLOG 100 UNIT/ML injection INJECT 8 UNITS INTO THE SKIN 3 (THREE) TIMES DAILY WITH MEALS. TAKE ONLY WHEN YOUR EAT MORE THAN 50% OF YOUR MEALS 09/02/19   Charlott Rakes, MD  pantoprazole (PROTONIX) 40 MG tablet Take 1 tablet (40 mg total) by mouth 2 (two) times daily. 01/01/18   Regalado, Belkys A, MD  potassium chloride SA (K-DUR,KLOR-CON) 20 MEQ tablet Take 1 tablet (20 mEq total) by mouth daily. 07/04/18   Charolette Forward, MD  PRADAXA 75 MG CAPS capsule Take 75 mg by mouth 2 (two) times daily. 10/23/18   [provider]  RESTASIS 0.05 % ophthalmic emulsion Place 1 drop into both eyes 2 (two) times daily. 11/19/17   [provider]  rosuvastatin (CRESTOR) 10 MG tablet Take 10 mg by mouth at bedtime. 09/23/19   [provider]  sucralfate (CARAFATE) 1 g tablet Take 1 tablet (1  g total) by mouth 4 (four) times daily -  with meals and at bedtime. 09/04/19   Recardo Evangelist, PA-C  TRULICITY 9.44 HQ/7.5FF SOPN INJECT 0.75 MG INTO THE SKIN ONCE A WEEK. 09/24/19   Charlott Rakes, MD    Physical Exam: Vitals:   09/24/19 2045 09/24/19 2115 09/24/19 2230 09/24/19 2251  BP:  105/82 (!) 142/116   Pulse: (!) 54  63 (!) 101  Resp:      Temp:      TempSrc:      SpO2: 98%  98% 98%    Constitutional: NAD, calm, comfortable Eyes:  PERRL, lids and conjunctivae normal ENMT: Mucous membranes are moist. Posterior pharynx clear of any exudate or lesions.Normal dentition.  Neck: normal, supple, no masses, no thyromegaly Respiratory: clear to auscultation bilaterally, no wheezing, no crackles. Normal respiratory effort. No accessory muscle use.  Cardiovascular: Regular rate and rhythm, no murmurs / rubs / gallops. No extremity edema. 2+ pedal pulses. No carotid bruits.  Abdomen: no tenderness, no masses palpated. No hepatosplenomegaly. Bowel sounds positive.  Musculoskeletal: no clubbing / cyanosis. No joint deformity upper and lower extremities. Good ROM, no contractures. Normal muscle tone.  Skin: no rashes, lesions, ulcers. No induration Neurologic: CN 2-12 grossly intact. Sensation intact, DTR normal. Strength 5/5 in all 4.  Psychiatric: Normal judgment and insight. Alert and oriented x 3. Normal mood.    Labs on Admission: I have personally reviewed following labs and imaging studies  CBC: Recent Labs  Lab 09/24/19 1934 09/24/19 1951  WBC 11.3*  --   NEUTROABS 7.9*  --   HGB 15.6* 16.3*  HCT 44.8 48.0*  MCV 87.5  --   PLT 206  --    Basic Metabolic Panel: Recent Labs  Lab 09/24/19 1934 09/24/19 1951  NA 129* 130*  K 3.1* 3.2*  CL 79*  --   CO2 33*  --   GLUCOSE 672*  --   BUN 28*  --   CREATININE 2.41*  --   CALCIUM 9.0  --    GFR: CrCl cannot be calculated (Unknown ideal weight.). Liver Function Tests: Recent Labs  Lab 09/24/19 1934  AST 16    ALT 17  ALKPHOS 77  BILITOT 1.4*  PROT 7.8  ALBUMIN 3.2*   No results for input(s): LIPASE, AMYLASE in the last 168 hours. No results for input(s): AMMONIA in the last 168 hours. Coagulation Profile: No results for input(s): INR, PROTIME in the last 168 hours. Cardiac Enzymes: No results for input(s): CKTOTAL, CKMB, CKMBINDEX, TROPONINI in the last 168 hours. BNP (last 3 results) No results for input(s): PROBNP in the last 8760 hours. HbA1C: No results for input(s): HGBA1C in the last 72 hours. CBG: Recent Labs  Lab 09/24/19 2132 09/24/19 2208 09/24/19 2246  GLUCAP 539* 533* 423*   Lipid Profile: No results for input(s): CHOL, HDL, LDLCALC, TRIG, CHOLHDL, LDLDIRECT in the last 72 hours. Thyroid Function Tests: No results for input(s): TSH, T4TOTAL, FREET4, T3FREE, THYROIDAB in the last 72 hours. Anemia Panel: No results for input(s): VITAMINB12, FOLATE, FERRITIN, TIBC, IRON, RETICCTPCT in the last 72 hours. Urine analysis:    Component Value Date/Time   COLORURINE YELLOW 02/21/2019 0021   APPEARANCEUR CLEAR 02/21/2019 0021   LABSPEC 1.031 (H) 02/21/2019 0021   PHURINE 6.0 02/21/2019 0021   GLUCOSEU >=500 (A) 02/21/2019 0021   HGBUR NEGATIVE 02/21/2019 0021   BILIRUBINUR negative 09/24/2019 1742   KETONESUR negative 09/24/2019 1742   KETONESUR NEGATIVE 02/21/2019 0021   PROTEINUR =100 (A) 09/24/2019 1742   PROTEINUR NEGATIVE 02/21/2019 0021   UROBILINOGEN 1.0 09/24/2019 1742   UROBILINOGEN 1.0 10/26/2014 0249   NITRITE Negative 09/24/2019 1742   NITRITE NEGATIVE 02/21/2019 0021   LEUKOCYTESUR Negative 09/24/2019 1742   LEUKOCYTESUR NEGATIVE 02/21/2019 0021    Radiological Exams on Admission: DG Chest 2 View  Result Date: 09/24/2019 CLINICAL DATA:  Atrial fibrillation, shortness of breath EXAM: CHEST - 2 VIEW COMPARISON:  09/04/2019 FINDINGS: No new consolidation or edema. No pleural effusion or pneumothorax. Mild cardiomegaly is unchanged. No acute osseous  abnormality IMPRESSION: No acute process chest.  Mild  cardiomegaly. Electronically Signed   By: Macy Mis M.D.   On: 09/24/2019 20:20   DG Ankle Complete Right  Result Date: 09/24/2019 CLINICAL DATA:  Status post trauma. EXAM: RIGHT ANKLE - COMPLETE 3+ VIEW COMPARISON:  None. FINDINGS: A small nondisplaced fracture deformity is seen along the distal tip of the right medial malleolus. There is no evidence of dislocation. There is no evidence of arthropathy or other focal bone abnormality. Mild to moderate severity diffuse soft tissue swelling is seen. IMPRESSION: Nondisplaced fracture of the right medial malleolus. Electronically Signed   By: Virgina Norfolk M.D.   On: 09/24/2019 20:16   CT Head Wo Contrast  Result Date: 09/24/2019 CLINICAL DATA:  Status post fall. EXAM: CT HEAD WITHOUT CONTRAST TECHNIQUE: Contiguous axial images were obtained from the base of the skull through the vertex without intravenous contrast. COMPARISON:  January 29, 2017 FINDINGS: Brain: No evidence of acute infarction, hemorrhage, hydrocephalus, extra-axial collection or mass lesion/mass effect. Vascular: No hyperdense vessel or unexpected calcification. Skull: Normal. Negative for fracture or focal lesion. Sinuses/Orbits: No acute finding. Other: None. IMPRESSION: No acute intracranial pathology. Electronically Signed   By: Virgina Norfolk M.D.   On: 09/24/2019 21:12   DG Foot Complete Right  Result Date: 09/24/2019 CLINICAL DATA:  Status post fall. EXAM: RIGHT FOOT COMPLETE - 3+ VIEW COMPARISON:  None. FINDINGS: A small nondisplaced fracture deformity is seen along the distal tip of the right medial malleolus. The osseous structures of the right are intact. There is no evidence of dislocation. There is no evidence of arthropathy or other focal bone abnormality. Mild to moderate severity soft tissue swelling is seen surrounding the right ankle. IMPRESSION: Acute fracture of the right medial malleolus. Electronically Signed    By: Virgina Norfolk M.D.   On: 09/24/2019 20:19    EKG: Independently reviewed.  Assessment/Plan Principal Problem:   Atrial fibrillation with RVR (HCC) Active Problems:   Diastolic dysfunction   Diabetes mellitus type 2 in obese (HCC)   Acute kidney injury superimposed on CKD ([place])   Closed right ankle fracture   DKA, type 2 ([place])    1. A.Fib RVR - 1. Rate control with cardizem, got 2 rounds in ED, if tachycardic again then start gtt 2. Cont pradaxa 3. Cont digoxin: level 1.0 (theraputic), suspect she went into RVR due to DKA 4. Tele monitor 2. DKA - Mild 1. DKA pathway 2. Insulin gtt 3. IVF: 2L bolus then NS at 75 cc/hr then D5 half at 50 cc/hr 4. Replacing K 5. BMP Q4H 3. AKI on CKD stage 3 - 1. Presumably due to dehydration from DKA 2. IVF as above 3. BMP Q4H 4. Hold ARB 5. Strict intake and output 4. Non-disp R ankle fx - 1. Splint 2. F/u ortho outpt 5. HFpEF - 1. Holding home diuretics  DVT prophylaxis: Pradaxa Code Status: Full Family Communication: No family in room Disposition Plan: Home after admit Consults called: None Admission status: Admit to inpatient  Severity of Illness: The appropriate patient status for this patient is INPATIENT. Inpatient status is judged to be reasonable and necessary in order to provide the required intensity of service to ensure the patient's safety. The patient's presenting symptoms, physical exam findings, and initial radiographic and laboratory data in the context of their chronic comorbidities is felt to place them at high risk for further clinical deterioration. Furthermore, it is not anticipated that the patient will be medically stable for discharge from the hospital within 2 midnights of  admission. The following factors support the patient status of inpatient.   IP status for treatment of DKA.   * I certify that at the point of admission it is my clinical judgment that the patient will require inpatient hospital  care spanning beyond 2 midnights from the point of admission due to high intensity of service, high risk for further deterioration and high frequency of surveillance required.*    Jacquel Mccamish M. DO [facility]  How to contact the [facility] Attending or Consulting provider [place] or covering provider during after hours [place], for this patient?  1. Check the care team in [facility] and look for a) attending/consulting [facility] provider listed and b) the [facility] team listed 2. Log into [email or web address]  Amion Physician Scheduling and messaging for groups and whole hospitals  On call and physician scheduling software for group practices, residents, hospitalists and other medical providers for call, clinic, rotation and shift schedules. On[employer] is a hospital-wide system for scheduling doctors and paging doctors on call. EasyPlot is for scientific plotting and data analysis.  [email or web address]  and use [facility]'s universal password to access. If you do not have the password, please contact the hospital operator.  3. Locate the [facility] provider you are looking for under [facility] and page to a number that you can be directly reached. 4. If you still have difficulty reaching the provider, please page the [facility] (Director on Call) for the Hospitalists listed on amion for assistance.  09/24/2019, 10:58 PM

## 2019-09-24 NOTE — ED Provider Notes (Signed)
[facility] EMERGENCY DEPARTMENT Provider Note   CSN: [id number] Arrival date & time: 09/24/19  1904     History No chief complaint on file.   [name] is a 64 y.o. female.  64yo F w/ PMH including A fib on pradaxa and digoxin, T2DM, dCHF, COPD, OSA who p/w R foot pain, hyperglycemia, and A fib.  Patient states that 3 days ago, she had a mechanical fall in her bathroom during which time her legs gave out and she injured her right ankle/foot.  She hit her head but did not lose consciousness.  She has continued to have right ankle and foot pain since then.  She went to urgent care today to be seen for the foot injury and was noted to be ill-appearing.  She was noted to have heart rate in the 140s with atrial fibrillation with RVR.  She was transferred to the ED for further evaluation.  The patient notes that she has been having some heart racing.  She denies any associated chest pain.  It is unclear how long the symptoms have been going on.  She reports compliance with her medications.  She was also noted to be severely hyperglycemic.  She states that her blood sugar has been running high for a while and she endorses polydipsia.  She denies any fevers, vomiting, urinary symptoms, or recent illness.  The history is provided by the patient.       Past Medical History:  Diagnosis Date  . Allergic rhinitis   . Arthritis   . Asthma   . Chest pain 12/27/2017  . Chronic diastolic CHF (congestive heart failure) ([place])   . COPD (chronic obstructive pulmonary disease) ([place])   . Depression   . DM (diabetes mellitus) ([place])   . DVT (deep vein thrombosis) in pregnancy   . Dysrhythmia    atrial fibrilation  . GERD (gastroesophageal reflux disease)   . Headache(784.0)   . HTN (hypertension)   . Hyperlipidemia   . Obesity   . Pneumonia 04/2018   RIGHT LOBE  . Shortness of breath   . Sleep apnea    compliant with CPAP    Patient Active Problem List   Diagnosis  Date Noted  . HHNC (hyperglycemic hyperosmolar nonketotic coma) ([place]) 02/21/2019  . Hypokalemia 02/21/2019  . Acute kidney injury superimposed on CKD ([place]) 02/21/2019  . Acute on chronic left systolic heart failure ([place]) 07/01/2018  . Congestive heart failure with left ventricular diastolic dysfunction, acute ([place]) 07/01/2018  . Right upper lobe pneumonia 05/14/2018  . Atrial fibrillation with RVR ([place]) 05/14/2018  . Chronic atrial fibrillation ([place]) 12/26/2017  . Diastolic dysfunction 63/14/9702  . Diabetes mellitus type 2 in obese ([place]) 12/26/2017  . Nausea vomiting and diarrhea   . Chest pain 02/26/2012  . OSA (obstructive sleep apnea) 09/21/2011  . Hypoxia 09/21/2011  . Diabetes mellitus ([place]) 02/04/2010  . Morbid obesity ([place]) 02/04/2010  . Atrial fibrillation ([place]) 02/04/2010  . Gastroparesis 02/04/2010  . GASTROENTERITIS 02/04/2010    Past Surgical History:  Procedure Laterality Date  . ABDOMINAL HYSTERECTOMY    . CATARACT EXTRACTION, BILATERAL    . CHOLECYSTECTOMY    . COLONOSCOPY WITH PROPOFOL N/A 03/05/2015   Procedure: COLONOSCOPY WITH PROPOFOL;  Surgeon: Carol Ada, MD;  Location: [facility];  Service: Endoscopy;  Laterality: N/A;  . DIAGNOSTIC LAPAROSCOPY    . ingrown hallux Left   . KNEE SURGERY    . LEFT HEART CATH AND CORONARY ANGIOGRAPHY N/A 12/31/2017  Procedure: LEFT HEART CATH AND CORONARY ANGIOGRAPHY;  Surgeon: Charolette Forward, MD;  Location: [facility];  Service: Cardiovascular;  Laterality: N/A;     OB History   No obstetric history on file.     Family History  Problem Relation Age of Onset  . Cancer Mother   . Hypertension Mother   . Cancer Father   . CAD Other   . Hypertension Sister   . Hypertension Brother     Social History   Tobacco Use  . Smoking status: Never Smoker  . Smokeless tobacco: Never Used  Substance Use Topics  . Alcohol use: No  . Drug use: No    Home Medications Prior to Admission medications     Medication Sig Start Date End Date Taking? Authorizing Provider  Accu-Chek FastClix Lancets MISC Use as directed three times daily 03/10/19   [name], NP  albuterol (VENTOLIN HFA) 108 [phone] Base) MCG/ACT inhaler  08/15/19   [provider]  Alcohol Swabs (B-D SINGLE USE SWABS REGULAR) PADS  02/19/19   [provider]  Blood Glucose Monitoring Suppl (TRUE METRIX METER) w/Device KIT Use to check blood sugar daily. 03/10/19   Charlott Rakes, MD  digoxin (LANOXIN) 0.125 MG tablet Take 1 tablet (125 mcg total) by mouth daily. 07/04/18 07/04/19  Charolette Forward, MD  DULoxetine (CYMBALTA) 60 MG capsule Take 1 capsule (60 mg total) by mouth daily. 05/20/18   Charolette Forward, MD  furosemide (LASIX) 40 MG tablet Take 1 tablet (40 mg total) by mouth daily. 02/24/19 02/24/20  Shelly Coss, MD  gabapentin (NEURONTIN) 300 MG capsule Take 300 mg by mouth 4 (four) times daily.  09/13/11   [provider]  glucose blood (TRUE METRIX BLOOD GLUCOSE TEST) test strip Use as instructed to check blood sugar daily. 03/10/19   Charlott Rakes, MD  Insulin Syringe-Needle U-100 (TRUEPLUS INSULIN SYRINGE) 31G X 5/16" 0.3 ML MISC Use to administer insulin every day. 03/24/19   Newlin, Charlane Ferretti, MD  LEVEMIR 100 UNIT/ML injection INJECT 0.4 MLS (40 UNITS TOTAL) INTO THE SKIN 2 (TWO) TIMES DAILY. 08/07/19   [name], NP  Melatonin 5 MG CAPS Take 1 capsule (5 mg total) by mouth at bedtime as needed. 03/04/19   [name], NP  metolazone (ZAROXOLYN) 5 MG tablet Take 5 mg by mouth daily. 06/01/19   [provider]  metoprolol tartrate (LOPRESSOR) 25 MG tablet Take 1 tablet (25 mg total) by mouth 2 (two) times daily. 02/24/19   Shelly Coss, MD  Multiple Vitamins-Minerals (MULTIVITAMIN WOMEN PO) Take 1 tablet by mouth daily.    [provider]  nitroGLYCERIN (NITROSTAT) 0.4 MG SL tablet Place 1 tablet (0.4 mg total) under the tongue every 5 (five) minutes x 3 doses as  needed for chest pain. 01/08/14   Charolette Forward, MD  NOVOLOG 100 UNIT/ML injection INJECT 8 UNITS INTO THE SKIN 3 (THREE) TIMES DAILY WITH MEALS. TAKE ONLY WHEN YOUR EAT MORE THAN 50% OF YOUR MEALS 09/02/19   Charlott Rakes, MD  pantoprazole (PROTONIX) 40 MG tablet Take 1 tablet (40 mg total) by mouth 2 (two) times daily. 01/01/18   Regalado, Belkys A, MD  potassium chloride SA (K-DUR,KLOR-CON) 20 MEQ tablet Take 1 tablet (20 mEq total) by mouth daily. 07/04/18   Charolette Forward, MD  PRADAXA 75 MG CAPS capsule Take 75 mg by mouth 2 (two) times daily. 10/23/18   [provider]  RESTASIS 0.05 % ophthalmic emulsion Place 1 drop into  both eyes 2 (two) times daily. 11/19/17   [provider]  sucralfate (CARAFATE) 1 g tablet Take 1 tablet (1 g total) by mouth 4 (four) times daily -  with meals and at bedtime. 09/04/19   Recardo Evangelist, PA-C  TRULICITY 1.06 YI/9.4WN SOPN INJECT 0.75 MG INTO THE SKIN ONCE A WEEK. 09/24/19   Charlott Rakes, MD    Allergies    Sulfa antibiotics  Review of Systems   Review of Systems All other systems reviewed and are negative except that which was mentioned in HPI  Physical Exam Updated Vital Signs BP (!) 143/78 (BP Location: Right Wrist)   Pulse (!) 140   Temp 97.6 F (36.4 C) (Temporal)   Resp (!) 21   SpO2 98%   Physical Exam Vitals and nursing note reviewed.  Constitutional:      General: She is not in acute distress.    Appearance: She is well-developed.  HENT:     Head: Normocephalic and atraumatic.  Eyes:     Conjunctiva/sclera: Conjunctivae normal.  Cardiovascular:     Rate and Rhythm: Tachycardia present. Rhythm irregularly irregular.     Heart sounds: Normal heart sounds. No murmur.  Pulmonary:     Effort: Pulmonary effort is normal.     Breath sounds: Normal breath sounds.  Abdominal:     General: There is no distension.     Palpations: Abdomen is soft.     Tenderness: There is no abdominal tenderness.  Musculoskeletal:         General: Tenderness present.     Cervical back: Neck supple.     Comments: Tenderness R medial malleolus and dorsal R foot; trace edema of foot; 2+ DP pulse  Skin:    General: Skin is warm and dry.     Findings: No bruising.  Neurological:     Mental Status: She is alert and oriented to person, place, and time.     Comments: Fluent speech  Psychiatric:        Judgment: Judgment normal.     ED Results / Procedures / Treatments   Labs (all labs ordered are listed, but only abnormal results are displayed) Labs Reviewed  COMPREHENSIVE METABOLIC PANEL - Abnormal; Notable for the following components:      Result Value   Sodium 129 (*)    Potassium 3.1 (*)    Chloride 79 (*)    CO2 33 (*)    Glucose, Bld 672 (*)    BUN 28 (*)    Creatinine, Ser 2.41 (*)    Albumin 3.2 (*)    Total Bilirubin 1.4 (*)    GFR calc non Af Amer 21 (*)    GFR calc Af Amer 24 (*)    Anion gap 17 (*)    All other components within normal limits  CBC WITH DIFFERENTIAL/PLATELET - Abnormal; Notable for the following components:   WBC 11.3 (*)    RBC 5.12 (*)    Hemoglobin 15.6 (*)    Neutro Abs 7.9 (*)    Monocytes Absolute 1.4 (*)    All other components within normal limits  LACTIC ACID, PLASMA - Abnormal; Notable for the following components:   Lactic Acid, Venous 2.7 (*)    All other components within normal limits  BETA-HYDROXYBUTYRIC ACID - Abnormal; Notable for the following components:   Beta-Hydroxybutyric Acid 0.56 (*)    All other components within normal limits  POCT I-STAT EG7 - Abnormal; Notable for the following components:  pCO2, Ven 62.9 (*)    pO2, Ven 51.0 (*)    Bicarbonate 38.1 (*)    TCO2 40 (*)    Acid-Base Excess 10.0 (*)    Sodium 130 (*)    Potassium 3.2 (*)    Calcium, Ion 1.06 (*)    HCT 48.0 (*)    Hemoglobin 16.3 (*)    All other components within normal limits  CBG MONITORING, ED - Abnormal; Notable for the following components:   Glucose-Capillary 539  (*)    All other components within normal limits  CBG MONITORING, ED - Abnormal; Notable for the following components:   Glucose-Capillary 533 (*)    All other components within normal limits  CBG MONITORING, ED - Abnormal; Notable for the following components:   Glucose-Capillary 423 (*)    All other components within normal limits  CBG MONITORING, ED - Abnormal; Notable for the following components:   Glucose-Capillary 304 (*)    All other components within normal limits  SARS CORONAVIRUS 2 (TAT 6-24 HRS)  BRAIN NATRIURETIC PEPTIDE  DIGOXIN LEVEL  LACTIC ACID, PLASMA  URINALYSIS, ROUTINE W REFLEX MICROSCOPIC  BETA-HYDROXYBUTYRIC ACID  HIV ANTIBODY (ROUTINE TESTING W REFLEX)  BASIC METABOLIC PANEL  BASIC METABOLIC PANEL  BASIC METABOLIC PANEL  BASIC METABOLIC PANEL  HEMOGLOBIN A1C  BETA-HYDROXYBUTYRIC ACID  BASIC METABOLIC PANEL  I-STAT VENOUS BLOOD GAS, ED  TROPONIN I (HIGH SENSITIVITY)  TROPONIN I (HIGH SENSITIVITY)    EKG EKG Interpretation  Date/Time:  Wednesday September 24 2019 19:08:51 EST Ventricular Rate:  134 PR Interval:    QRS Duration: 98 QT Interval:  280 QTC Calculation: 418 R Axis:   35 Text Interpretation: Atrial fibrillation Repol abnrm suggests ischemia, diffuse leads A fib w/ RVR, some diffuse ST depression which may be rate related Confirmed by Theotis Burrow [id number]) on 09/24/2019 9:07:42 PM   Radiology DG Chest 2 View  Result Date: 09/24/2019 CLINICAL DATA:  Atrial fibrillation, shortness of breath EXAM: CHEST - 2 VIEW COMPARISON:  09/04/2019 FINDINGS: No new consolidation or edema. No pleural effusion or pneumothorax. Mild cardiomegaly is unchanged. No acute osseous abnormality IMPRESSION: No acute process chest.  Mild cardiomegaly. Electronically Signed   By: Macy Mis M.D.   On: 09/24/2019 20:20   DG Ankle Complete Right  Result Date: 09/24/2019 CLINICAL DATA:  Status post trauma. EXAM: RIGHT ANKLE - COMPLETE 3+ VIEW COMPARISON:  None.  FINDINGS: A small nondisplaced fracture deformity is seen along the distal tip of the right medial malleolus. There is no evidence of dislocation. There is no evidence of arthropathy or other focal bone abnormality. Mild to moderate severity diffuse soft tissue swelling is seen. IMPRESSION: Nondisplaced fracture of the right medial malleolus. Electronically Signed   By: Virgina Norfolk M.D.   On: 09/24/2019 20:16   CT Head Wo Contrast  Result Date: 09/24/2019 CLINICAL DATA:  Status post fall. EXAM: CT HEAD WITHOUT CONTRAST TECHNIQUE: Contiguous axial images were obtained from the base of the skull through the vertex without intravenous contrast. COMPARISON:  January 29, 2017 FINDINGS: Brain: No evidence of acute infarction, hemorrhage, hydrocephalus, extra-axial collection or mass lesion/mass effect. Vascular: No hyperdense vessel or unexpected calcification. Skull: Normal. Negative for fracture or focal lesion. Sinuses/Orbits: No acute finding. Other: None. IMPRESSION: No acute intracranial pathology. Electronically Signed   By: Virgina Norfolk M.D.   On: 09/24/2019 21:12   DG Foot Complete Right  Result Date: 09/24/2019 CLINICAL DATA:  Status post fall. EXAM: RIGHT FOOT COMPLETE - 3+  VIEW COMPARISON:  None. FINDINGS: A small nondisplaced fracture deformity is seen along the distal tip of the right medial malleolus. The osseous structures of the right are intact. There is no evidence of dislocation. There is no evidence of arthropathy or other focal bone abnormality. Mild to moderate severity soft tissue swelling is seen surrounding the right ankle. IMPRESSION: Acute fracture of the right medial malleolus. Electronically Signed   By: Virgina Norfolk M.D.   On: 09/24/2019 20:19    Procedures .Critical Care Performed by: Sharlett Iles, MD Authorized by: Sharlett Iles, MD   Critical care provider statement:    Critical care time (minutes):  30   Critical care time was exclusive of:   Separately billable procedures and treating other patients   Critical care was necessary to treat or prevent imminent or life-threatening deterioration of the following conditions:  Endocrine crisis and cardiac failure   Critical care was time spent personally by me on the following activities:  Development of treatment plan with patient or surrogate, evaluation of patient's response to treatment, examination of patient, obtaining history from patient or surrogate, ordering and performing treatments and interventions, ordering and review of laboratory studies, ordering and review of radiographic studies, re-evaluation of patient's condition and review of old charts   (including critical care time)  Medications Ordered in ED Medications  diltiazem (CARDIZEM) injection 10 mg (has no administration in time range)    ED Course  I have reviewed the triage vital signs and the nursing notes.  Pertinent labs & imaging results that were available during my care of the patient were reviewed by me and considered in my medical decision making (see chart for details).    MDM Rules/Calculators/A&P                      Patient was tachycardic but mentating appropriately and nontoxic on exam.  Heart rate variable 110s to 120s, A. fib with RVR on the EKG.  Gave the patient a 10 mg diltiazem bolus which improved her heart rate.  Initial lab work shows potassium 3.1, glucose 672, anion gap 17, WBC 11.3, CO2 33, dig level normal, venous pH 7.39. Initiated insulin and IVF boluses. XR foot/ankle shows small medial malleolus fx that can be managed non-operatively w/ splint and ortho f/u in clinic. Gave 2nd dilt bolus for continued mild tachycardia. Gave home metoprolol dose.   Discussed admission with [facility], Dr. Alcario Drought. Final Clinical Impression(s) / ED Diagnoses Final diagnoses:  None    Rx / DC Orders ED Discharge Orders    None       Ryszard Socarras, Wenda Overland, MD 09/25/19 0028

## 2019-09-24 NOTE — ED Provider Notes (Signed)
[facility]    CSN: [id number] Arrival date & time: 09/24/19  1651      History   Chief Complaint Chief Complaint  Patient presents with  . Fall    HPI [name] is a 64 y.o. female presenting for persistent right ankle pain/swelling s/p fall on Sunday.  Patient appears diaphoretic, ill, and has significant hx including T2DM w/o insulin use.  Last ate ~3:30/4pm (cheeseburger).  Does endorse nausea.  Last A1c per chart review 9.8% on 06/04/19.  No active chest pain, SOB.  Please see MDM for remainder of clinical course.    Past Medical History:  Diagnosis Date  . Allergic rhinitis   . Arthritis   . Asthma   . Chest pain 12/27/2017  . Chronic diastolic CHF (congestive heart failure) ([place])   . COPD (chronic obstructive pulmonary disease) ([place])   . Depression   . DM (diabetes mellitus) ([place])   . DVT (deep vein thrombosis) in pregnancy   . Dysrhythmia    atrial fibrilation  . GERD (gastroesophageal reflux disease)   . Headache(784.0)   . HTN (hypertension)   . Hyperlipidemia   . Obesity   . Pneumonia 04/2018   RIGHT LOBE  . Shortness of breath   . Sleep apnea    compliant with CPAP    Patient Active Problem List   Diagnosis Date Noted  . HHNC (hyperglycemic hyperosmolar nonketotic coma) ([place]) 02/21/2019  . Hypokalemia 02/21/2019  . Acute kidney injury superimposed on CKD ([place]) 02/21/2019  . Acute on chronic left systolic heart failure ([place]) 07/01/2018  . Congestive heart failure with left ventricular diastolic dysfunction, acute ([place]) 07/01/2018  . Right upper lobe pneumonia 05/14/2018  . Atrial fibrillation with RVR ([place]) 05/14/2018  . Chronic atrial fibrillation ([place]) 12/26/2017  . Diastolic dysfunction 93/81/0175  . Diabetes mellitus type 2 in obese ([place]) 12/26/2017  . Nausea vomiting and diarrhea   . Chest pain 02/26/2012  . OSA (obstructive sleep apnea) 09/21/2011  . Hypoxia 09/21/2011  . Diabetes mellitus ([place]) 02/04/2010  .  Morbid obesity ([place]) 02/04/2010  . Atrial fibrillation ([place]) 02/04/2010  . Gastroparesis 02/04/2010  . GASTROENTERITIS 02/04/2010    Past Surgical History:  Procedure Laterality Date  . ABDOMINAL HYSTERECTOMY    . CATARACT EXTRACTION, BILATERAL    . CHOLECYSTECTOMY    . COLONOSCOPY WITH PROPOFOL N/A 03/05/2015   Procedure: COLONOSCOPY WITH PROPOFOL;  Surgeon: Carol Ada, MD;  Location: [facility];  Service: Endoscopy;  Laterality: N/A;  . DIAGNOSTIC LAPAROSCOPY    . ingrown hallux Left   . KNEE SURGERY    . LEFT HEART CATH AND CORONARY ANGIOGRAPHY N/A 12/31/2017   Procedure: LEFT HEART CATH AND CORONARY ANGIOGRAPHY;  Surgeon: Charolette Forward, MD;  Location: [facility];  Service: Cardiovascular;  Laterality: N/A;    OB History   No obstetric history on file.      Home Medications    Prior to Admission medications   Medication Sig Start Date End Date Taking? Authorizing Provider  Accu-Chek FastClix Lancets MISC Use as directed three times daily 03/10/19   [name], NP  albuterol (VENTOLIN HFA) [id number] Base) MCG/ACT inhaler  08/15/19   [provider]  Alcohol Swabs (B-D SINGLE USE SWABS REGULAR) PADS  02/19/19   [provider]  Blood Glucose Monitoring Suppl (TRUE METRIX METER) w/Device KIT Use to check blood sugar daily. 03/10/19   Charlott Rakes, MD  digoxin (LANOXIN) 0.125 MG tablet Take 1 tablet (125 mcg  total) by mouth daily. 07/04/18 07/04/19  Charolette Forward, MD  DULoxetine (CYMBALTA) 60 MG capsule Take 1 capsule (60 mg total) by mouth daily. 05/20/18   Charolette Forward, MD  furosemide (LASIX) 40 MG tablet Take 1 tablet (40 mg total) by mouth daily. 02/24/19 02/24/20  Shelly Coss, MD  gabapentin (NEURONTIN) 300 MG capsule Take 300 mg by mouth 4 (four) times daily.  09/13/11   [provider]  glucose blood (TRUE METRIX BLOOD GLUCOSE TEST) test strip Use as instructed to check blood sugar daily. 03/10/19   Charlott Rakes, MD  Insulin  Syringe-Needle U-100 (TRUEPLUS INSULIN SYRINGE) 31G X 5/16" 0.3 ML MISC Use to administer insulin every day. 03/24/19   Newlin, Charlane Ferretti, MD  LEVEMIR 100 UNIT/ML injection INJECT 0.4 MLS (40 UNITS TOTAL) INTO THE SKIN 2 (TWO) TIMES DAILY. 08/07/19   [name], NP  Melatonin 5 MG CAPS Take 1 capsule (5 mg total) by mouth at bedtime as needed. 03/04/19   [name], NP  metolazone (ZAROXOLYN) 5 MG tablet Take 5 mg by mouth daily. 06/01/19   [provider]  metoprolol tartrate (LOPRESSOR) 25 MG tablet Take 1 tablet (25 mg total) by mouth 2 (two) times daily. 02/24/19   Shelly Coss, MD  Multiple Vitamins-Minerals (MULTIVITAMIN WOMEN PO) Take 1 tablet by mouth daily.    [provider]  nitroGLYCERIN (NITROSTAT) 0.4 MG SL tablet Place 1 tablet (0.4 mg total) under the tongue every 5 (five) minutes x 3 doses as needed for chest pain. 01/08/14   Charolette Forward, MD  NOVOLOG 100 UNIT/ML injection INJECT 8 UNITS INTO THE SKIN 3 (THREE) TIMES DAILY WITH MEALS. TAKE ONLY WHEN YOUR EAT MORE THAN 50% OF YOUR MEALS 09/02/19   Charlott Rakes, MD  pantoprazole (PROTONIX) 40 MG tablet Take 1 tablet (40 mg total) by mouth 2 (two) times daily. 01/01/18   Regalado, Belkys A, MD  potassium chloride SA (K-DUR,KLOR-CON) 20 MEQ tablet Take 1 tablet (20 mEq total) by mouth daily. 07/04/18   Charolette Forward, MD  PRADAXA 75 MG CAPS capsule Take 75 mg by mouth 2 (two) times daily. 10/23/18   [provider]  RESTASIS 0.05 % ophthalmic emulsion Place 1 drop into both eyes 2 (two) times daily. 11/19/17   [provider]  sucralfate (CARAFATE) 1 g tablet Take 1 tablet (1 g total) by mouth 4 (four) times daily -  with meals and at bedtime. 09/04/19   Recardo Evangelist, PA-C  TRULICITY 9.21 JH/4.1DE SOPN INJECT 0.75 MG INTO THE SKIN ONCE A WEEK. 09/24/19   Charlott Rakes, MD    Family History Family History  Problem Relation Age of Onset  . Cancer Mother   . Hypertension Mother   .  Cancer Father   . CAD Other   . Hypertension Sister   . Hypertension Brother     Social History Social History   Tobacco Use  . Smoking status: Never Smoker  . Smokeless tobacco: Never Used  Substance Use Topics  . Alcohol use: No  . Drug use: No     Allergies   Sulfa antibiotics   Review of Systems As per HPI   Physical Exam Triage Vital Signs ED Triage Vitals  Enc Vitals Group     BP      Pulse      Resp      Temp      Temp src      SpO2      Weight  Height      Head Circumference      Peak Flow      Pain Score      Pain Loc      Pain Edu?      Excl. in [place]?    No data found.  Updated Vital Signs BP (!) 127/94 (BP Location: Right Arm) Comment: Pt sts has not taken BP medication today  Pulse 91   Temp 97.7 F (36.5 C) (Oral)   Resp 18   SpO2 98%   Visual Acuity Right Eye Distance:   Left Eye Distance:   Bilateral Distance:    Right Eye Near:   Left Eye Near:    Bilateral Near:     Physical Exam Constitutional:      General: She is not in acute distress.    Appearance: She is obese. She is ill-appearing and diaphoretic.  HENT:     Head: Normocephalic and atraumatic.     Mouth/Throat:     Mouth: Mucous membranes are dry.  Eyes:     General: No scleral icterus.    Conjunctiva/sclera: Conjunctivae normal.     Pupils: Pupils are equal, round, and reactive to light.  Cardiovascular:     Rate and Rhythm: Tachycardia present. Rhythm irregular.  Pulmonary:     Effort: Pulmonary effort is normal.  Skin:    Coloration: Skin is not jaundiced or pale.  Neurological:     Mental Status: She is alert and oriented to person, place, and time.      UC Treatments / Results  Labs (all labs ordered are listed, but only abnormal results are displayed) Labs Reviewed  POCT FASTING CBG [facility] MANUAL ENTRY - Abnormal; Notable for the following components:      Result Value   POCT Glucose ([facility]) 600 (*)    All other components within normal limits    POCT URINALYSIS DIP (MANUAL ENTRY) - Abnormal; Notable for the following components:   Glucose, UA =500 (*)    Blood, UA trace-intact (*)    Protein Ur, POC =100 (*)    All other components within normal limits    EKG   Radiology No results found.  Procedures Procedures (including critical care time)  Medications Ordered in UC Medications - No data to display  Initial Impression / Assessment and Plan / UC Course  I have reviewed the triage vital signs and the nursing notes.  Pertinent labs & imaging results that were available during my care of the patient were reviewed by me and considered in my medical decision making (see chart for details).     Patient presenting for right ankle pain status post fall on Sunday.  More concerning to this provider, patient does not appear well, is diaphoretic.  Patient does endorse nausea: CBG greater than 600.  Urine dipstick glucose, trace intact blood, protein.  Symptomatic with nausea, diaphoresis: Denying chest pain.  On exam, patient was tachycardic (>150 on auscultation; did not correlate w/ pulse oximeter reading) with a regular rhythm concerning for A. fib with RVR.  EKG done in office, reviewed by me and compared to previous from 09/05/2019: Significant for A. fib with RVR.  ST depressions in leads II, V4 -V6 without reciprocal changes.  Patient's lips became mildly cyanotic, face developing dusky appearance and she became more diaphoretic: Given 4 L O2 via nasal cannula which she tolerated well.  Patient kept n.p.o. despite endorsing thirst.  SPO2 98% at time of discharge with improved appearance.  Patient  transported to ER for further evaluation/management via EMS. Final Clinical Impressions(s) / UC Diagnoses   Final diagnoses:  Type 2 diabetes mellitus with hyperglycemia, without long-term current use of insulin (HCC)  Diaphoresis  Nausea without vomiting  Weakness  Atrial fibrillation with rapid ventricular response [facility])      Discharge Instructions     Patient transferred to ER in stable condition via EMS on 4 L O2 via [place] w/ O2 98%    ED Prescriptions    None     PDMP not reviewed this encounter.   [place], [place], [place] 09/25/19 1634

## 2019-09-24 NOTE — ED Triage Notes (Signed)
Pt here from UC with c/o afib rvr 140's cbg grater 600 , pt had a syncopal episode on Sunday and was was at [facility] for that

## 2019-09-24 NOTE — Discharge Instructions (Addendum)
Patient transferred to ER in stable condition via EMS on 4 L O2 via [place] w/ O2 98%

## 2019-09-24 NOTE — Progress Notes (Signed)
Virtual Visit via Telephone Note  I connected with [name] on 09/24/19 at  2:50 PM EST by telephone and verified that I am speaking with the correct person using two identifiers.   I discussed the limitations, risks, security and privacy concerns of performing an evaluation and management service by telephone and the availability of in person appointments. I also discussed with the patient that there may be a patient responsible charge related to this service. The patient expressed understanding and agreed to proceed.   History of Present Illness: [name] is having a tele visit status post emergency room visit for a typical chest pain . Presented with left sided sharp chest pain. It's hard to lie on the left side at night because it causes more pain. Also, had a light headache with frontal pain. She was taking OTC Tylenol with no relief  Rhythm irregularly irregular and mild LUQ and LLQ tenderness.  Past Medical History:  Diagnosis Date  . Allergic rhinitis   . Arthritis   . Asthma   . Chest pain 12/27/2017  . Chronic diastolic CHF (congestive heart failure) ([place])   . COPD (chronic obstructive pulmonary disease) ([place])   . Depression   . DM (diabetes mellitus) ([place])   . DVT (deep vein thrombosis) in pregnancy   . Dysrhythmia    atrial fibrilation  . GERD (gastroesophageal reflux disease)   . Headache(784.0)   . HTN (hypertension)   . Hyperlipidemia   . Obesity   . Pneumonia 04/2018   RIGHT LOBE  . Shortness of breath   . Sleep apnea    compliant with CPAP  . Current Outpatient Medications on File Prior to Visit  Medication Sig Dispense Refill  . Accu-Chek FastClix Lancets MISC Use as directed three times daily 102 each 12  . albuterol (VENTOLIN HFA) 108 (90 Base) MCG/ACT inhaler Inhale 2 puffs into the lungs every 4 (four) hours as needed for wheezing or shortness of breath.     . Alcohol Swabs (B-D SINGLE USE SWABS REGULAR) PADS     . Blood Glucose  Monitoring Suppl (TRUE METRIX METER) w/Device KIT Use to check blood sugar daily. 1 kit 0  . DULoxetine (CYMBALTA) 60 MG capsule Take 1 capsule (60 mg total) by mouth daily. 30 capsule 3  . furosemide (LASIX) 40 MG tablet Take 1 tablet (40 mg total) by mouth daily. (Patient taking differently: Take 40 mg by mouth 2 (two) times daily. ) 60 tablet 11  . gabapentin (NEURONTIN) 300 MG capsule Take 600 mg by mouth 2 (two) times daily.     Marland Kitchen glucose blood (TRUE METRIX BLOOD GLUCOSE TEST) test strip Use as instructed to check blood sugar daily. 100 each 11  . Insulin Syringe-Needle U-100 (TRUEPLUS INSULIN SYRINGE) 31G X 5/16" 0.3 ML MISC Use to administer insulin every day. 100 each 11  . LEVEMIR 100 UNIT/ML injection INJECT 0.4 MLS (40 UNITS TOTAL) INTO THE SKIN 2 (TWO) TIMES DAILY. (Patient taking differently: Inject 50 Units into the skin 2 (two) times daily. ) 60 mL 1  . Melatonin 5 MG CAPS Take 1 capsule (5 mg total) by mouth at bedtime as needed. (Patient taking differently: Take 5 mg by mouth at bedtime as needed (sleep). ) 30 capsule 3  . metolazone (ZAROXOLYN) 5 MG tablet Take 5 mg by mouth daily.    . metoprolol tartrate (LOPRESSOR) 25 MG tablet Take 1 tablet (25 mg total) by mouth 2 (two) times daily. 60 tablet 0  . Multiple  Vitamins-Minerals (MULTIVITAMIN WOMEN PO) Take 1 tablet by mouth daily.    . nitroGLYCERIN (NITROSTAT) 0.4 MG SL tablet Place 1 tablet (0.4 mg total) under the tongue every 5 (five) minutes x 3 doses as needed for chest pain. 25 tablet 12  . NOVOLOG 100 UNIT/ML injection INJECT 8 UNITS INTO THE SKIN 3 (THREE) TIMES DAILY WITH MEALS. TAKE ONLY WHEN YOUR EAT MORE THAN 50% OF YOUR MEALS (Patient taking differently: Inject 5 Units into the skin 3 (three) times daily with meals. ) 30 mL 0  . pantoprazole (PROTONIX) 40 MG tablet Take 1 tablet (40 mg total) by mouth 2 (two) times daily. 60 tablet 0  . potassium chloride SA (K-DUR,KLOR-CON) 20 MEQ tablet Take 1 tablet (20 mEq total) by  mouth daily. 30 tablet 3  . RESTASIS 0.05 % ophthalmic emulsion Place 1 drop into both eyes 2 (two) times daily.  12  . TRULICITY 5.03 TW/6.5KC SOPN INJECT 0.75 MG INTO THE SKIN ONCE A WEEK. (Patient taking differently: Inject 0.75 mg into the skin once a week. ) 2 mL 2   No current facility-administered medications on file prior to visit.    Observations/Objective: Review of Systems  Constitutional: Positive for malaise/fatigue.  Gastrointestinal: Positive for abdominal pain.  Neurological: Positive for headaches.  All other systems reviewed and are negative.   Assessment and Plan: [name] was seen today for fall, leg pain and foot pain.  Diagnoses and all orders for this visit:  Hospital discharge follow-up Emergency room to discharge home for unspecific chest pain. She admits to feeling a little better but still not herself. EKG revealed Atrial fibrillation Right axis deviation no significant changes from previous. Advised to follow up with PCP if necessary   Chest pain, unspecified type Discussed signs and symptoms of heart attack and if symptoms become worse shortness of breath, headache, left sided radiation to neck and arm low back pain call 911 take NTG or '325mg'$  asprin Patient verbalized understanding    Follow Up Instructions:    I discussed the assessment and treatment plan with the patient. The patient was provided an opportunity to ask questions and all were answered. The patient agreed with the plan and demonstrated an understanding of the instructions.   The patient was advised to call back or seek an in-person evaluation if the symptoms worsen or if the condition fails to improve as anticipated.  I provided15 minutes of non-face-to-face time during this encounter. Review charts imaging and labs   [name], NP

## 2019-09-24 NOTE — Progress Notes (Signed)
Orthopedic Tech Progress Note Patient Details:  [name] 1956-02-02 [id number]  Ortho Devices Type of Ortho Device: Post (short leg) splint, Stirrup splint, Ace wrap Ortho Device/Splint Location: RLE Ortho Device/Splint Interventions: Ordered, Application   Post Interventions Patient Tolerated: Well Instructions Provided: Care of device   Staci Righter 09/24/2019, 11:09 PM

## 2019-09-24 NOTE — ED Triage Notes (Signed)
Pt states fell in her bathroom on Sunday. States her legs gave out on her, hitting head and chest on wall. Pt denies loc, c/o rt ankle pain. Pt is diaphoretic on arrival states feels nauseous.

## 2019-09-24 NOTE — ED Notes (Signed)
On arrival of EMS, pt is not cyanotic around lips and diaphoresis not as bad. Pt states feeling little better.

## 2019-09-24 NOTE — ED Notes (Addendum)
Report given, to be transported upstairs

## 2019-09-24 NOTE — ED Notes (Signed)
Pt to Xray.

## 2019-09-25 ENCOUNTER — Encounter (HOSPITAL_COMMUNITY): Payer: Self-pay | Admitting: Internal Medicine

## 2019-09-25 DIAGNOSIS — I4891 Unspecified atrial fibrillation: Secondary | ICD-10-CM

## 2019-09-25 LAB — URINALYSIS, ROUTINE W REFLEX MICROSCOPIC
Bacteria, UA: NONE SEEN
Bilirubin Urine: NEGATIVE
Glucose, UA: >=500 mg/dL — AB
Hgb urine dipstick: NEGATIVE
Ketones, ur: NEGATIVE mg/dL
Leukocytes,Ua: NEGATIVE
Nitrite: NEGATIVE
Protein, ur: NEGATIVE mg/dL
Specific Gravity, Urine: 1.015 (ref 1.005–1.030)
pH: 6 (ref 5.0–8.0)

## 2019-09-25 LAB — GLUCOSE, CAPILLARY
Glucose-Capillary: 190 mg/dL — ABNORMAL HIGH (ref 70–99)
Glucose-Capillary: 203 mg/dL — ABNORMAL HIGH (ref 70–99)
Glucose-Capillary: 212 mg/dL — ABNORMAL HIGH (ref 70–99)
Glucose-Capillary: 237 mg/dL — ABNORMAL HIGH (ref 70–99)
Glucose-Capillary: 264 mg/dL — ABNORMAL HIGH (ref 70–99)
Glucose-Capillary: 264 mg/dL — ABNORMAL HIGH (ref 70–99)
Glucose-Capillary: 211 mg/dL — ABNORMAL HIGH (ref 70–99)
Glucose-Capillary: 189 mg/dL — ABNORMAL HIGH (ref 70–99)
Glucose-Capillary: 202 mg/dL — ABNORMAL HIGH (ref 70–99)
Glucose-Capillary: 202 mg/dL — ABNORMAL HIGH (ref 70–99)
Glucose-Capillary: 209 mg/dL — ABNORMAL HIGH (ref 70–99)
Glucose-Capillary: 216 mg/dL — ABNORMAL HIGH (ref 70–99)
Glucose-Capillary: 196 mg/dL — ABNORMAL HIGH (ref 70–99)
Glucose-Capillary: 215 mg/dL — ABNORMAL HIGH (ref 70–99)
Glucose-Capillary: 260 mg/dL — ABNORMAL HIGH (ref 70–99)
Glucose-Capillary: 222 mg/dL — ABNORMAL HIGH (ref 70–99)

## 2019-09-25 LAB — SARS CORONAVIRUS 2 (TAT 6-24 HRS): SARS Coronavirus 2: NEGATIVE

## 2019-09-25 LAB — BASIC METABOLIC PANEL
Anion gap: 11 (ref 5–15)
Anion gap: 12 (ref 5–15)
Anion gap: 12 (ref 5–15)
Anion gap: 14 (ref 5–15)
BUN: 23 mg/dL (ref 8–23)
BUN: 24 mg/dL — ABNORMAL HIGH (ref 8–23)
BUN: 25 mg/dL — ABNORMAL HIGH (ref 8–23)
BUN: 27 mg/dL — ABNORMAL HIGH (ref 8–23)
CO2: 32 mmol/L (ref 22–32)
CO2: 33 mmol/L — ABNORMAL HIGH (ref 22–32)
CO2: 34 mmol/L — ABNORMAL HIGH (ref 22–32)
CO2: 34 mmol/L — ABNORMAL HIGH (ref 22–32)
Calcium: 8.5 mg/dL — ABNORMAL LOW (ref 8.9–10.3)
Calcium: 8.6 mg/dL — ABNORMAL LOW (ref 8.9–10.3)
Calcium: 8.6 mg/dL — ABNORMAL LOW (ref 8.9–10.3)
Calcium: 8.7 mg/dL — ABNORMAL LOW (ref 8.9–10.3)
Chloride: 89 mmol/L — ABNORMAL LOW (ref 98–111)
Chloride: 91 mmol/L — ABNORMAL LOW (ref 98–111)
Chloride: 92 mmol/L — ABNORMAL LOW (ref 98–111)
Chloride: 92 mmol/L — ABNORMAL LOW (ref 98–111)
Creatinine, Ser: 1.89 mg/dL — ABNORMAL HIGH (ref 0.44–1.00)
Creatinine, Ser: 1.94 mg/dL — ABNORMAL HIGH (ref 0.44–1.00)
Creatinine, Ser: 2.06 mg/dL — ABNORMAL HIGH (ref 0.44–1.00)
Creatinine, Ser: 2.11 mg/dL — ABNORMAL HIGH (ref 0.44–1.00)
GFR calc Af Amer: 28 mL/min — ABNORMAL LOW (ref >60)
GFR calc Af Amer: 29 mL/min — ABNORMAL LOW (ref >60)
GFR calc Af Amer: 31 mL/min — ABNORMAL LOW (ref >60)
GFR calc Af Amer: 32 mL/min — ABNORMAL LOW (ref >60)
GFR calc non Af Amer: 24 mL/min — ABNORMAL LOW (ref >60)
GFR calc non Af Amer: 25 mL/min — ABNORMAL LOW (ref >60)
GFR calc non Af Amer: 27 mL/min — ABNORMAL LOW (ref >60)
GFR calc non Af Amer: 28 mL/min — ABNORMAL LOW (ref >60)
Glucose, Bld: 201 mg/dL — ABNORMAL HIGH (ref 70–99)
Glucose, Bld: 201 mg/dL — ABNORMAL HIGH (ref 70–99)
Glucose, Bld: 223 mg/dL — ABNORMAL HIGH (ref 70–99)
Glucose, Bld: 279 mg/dL — ABNORMAL HIGH (ref 70–99)
Potassium: 2.8 mmol/L — ABNORMAL LOW (ref 3.5–5.1)
Potassium: 2.8 mmol/L — ABNORMAL LOW (ref 3.5–5.1)
Potassium: 3.1 mmol/L — ABNORMAL LOW (ref 3.5–5.1)
Potassium: 3.3 mmol/L — ABNORMAL LOW (ref 3.5–5.1)
Sodium: 135 mmol/L (ref 135–145)
Sodium: 137 mmol/L (ref 135–145)
Sodium: 137 mmol/L (ref 135–145)
Sodium: 137 mmol/L (ref 135–145)

## 2019-09-25 LAB — CBC
HCT: 39.6 % (ref 36.0–46.0)
Hemoglobin: 14 g/dL (ref 12.0–15.0)
MCH: 30.6 pg (ref 26.0–34.0)
MCHC: 35.4 g/dL (ref 30.0–36.0)
MCV: 86.5 fL (ref 80.0–100.0)
Platelets: 187 10*3/uL (ref 150–400)
RBC: 4.58 MIL/uL (ref 3.87–5.11)
RDW: 12.3 % (ref 11.5–15.5)
WBC: 10.6 10*3/uL — ABNORMAL HIGH (ref 4.0–10.5)
nRBC: 0 % (ref 0.0–0.2)

## 2019-09-25 LAB — BETA-HYDROXYBUTYRIC ACID
Beta-Hydroxybutyric Acid: 0.08 mmol/L (ref 0.05–0.27)
Beta-Hydroxybutyric Acid: 0.13 mmol/L (ref 0.05–0.27)

## 2019-09-25 LAB — LACTIC ACID, PLASMA: Lactic Acid, Venous: 3.1 mmol/L (ref 0.5–1.9)

## 2019-09-25 LAB — HIV ANTIBODY (ROUTINE TESTING W REFLEX): HIV Screen 4th Generation wRfx: NONREACTIVE

## 2019-09-25 LAB — TROPONIN I (HIGH SENSITIVITY): Troponin I (High Sensitivity): 13 ng/L (ref <18)

## 2019-09-25 LAB — HEMOGLOBIN A1C
Hgb A1c MFr Bld: 12.7 % — ABNORMAL HIGH (ref 4.8–5.6)
Mean Plasma Glucose: 317.79 mg/dL

## 2019-09-25 MED ORDER — DULOXETINE HCL 60 MG PO CPEP
60.0000 mg | ORAL_CAPSULE | Freq: Every day | ORAL | Status: DC
  Administered 2019-09-25 – 2019-09-26 (×2): 60 mg via ORAL
  Filled 2019-09-25 (×2): qty 1

## 2019-09-25 MED ORDER — INSULIN DETEMIR 100 UNIT/ML ~~LOC~~ SOLN
25.0000 [IU] | Freq: Two times a day (BID) | SUBCUTANEOUS | Status: DC
  Administered 2019-09-25 – 2019-09-26 (×3): 25 [IU] via SUBCUTANEOUS
  Filled 2019-09-25 (×4): qty 0.25

## 2019-09-25 MED ORDER — POTASSIUM CHLORIDE 10 MEQ/100ML IV SOLN: 10.0000 meq | INTRAVENOUS | Status: DC

## 2019-09-25 MED ORDER — DILTIAZEM HCL 60 MG PO TABS
60.0000 mg | ORAL_TABLET | Freq: Three times a day (TID) | ORAL | Status: DC
  Administered 2019-09-25 – 2019-09-26 (×4): 60 mg via ORAL
  Filled 2019-09-25 (×4): qty 1

## 2019-09-25 MED ORDER — ACETAMINOPHEN 650 MG RE SUPP: 650.0000 mg | Freq: Four times a day (QID) | RECTAL | Status: DC | PRN

## 2019-09-25 MED ORDER — INSULIN ASPART 100 UNIT/ML ~~LOC~~ SOLN
0.0000 [IU] | Freq: Three times a day (TID) | SUBCUTANEOUS | Status: DC
  Administered 2019-09-25: 8 [IU] via SUBCUTANEOUS
  Administered 2019-09-26: 15 [IU] via SUBCUTANEOUS
  Administered 2019-09-26: 8 [IU] via SUBCUTANEOUS

## 2019-09-25 MED ORDER — INSULIN ASPART 100 UNIT/ML ~~LOC~~ SOLN
5.0000 [IU] | Freq: Three times a day (TID) | SUBCUTANEOUS | Status: DC
  Administered 2019-09-26 (×2): 5 [IU] via SUBCUTANEOUS

## 2019-09-25 MED ORDER — ACETAMINOPHEN 325 MG PO TABS
650.0000 mg | ORAL_TABLET | Freq: Four times a day (QID) | ORAL | Status: DC | PRN
  Administered 2019-09-25: 650 mg via ORAL
  Filled 2019-09-25 (×2): qty 2

## 2019-09-25 MED ORDER — INSULIN ASPART 100 UNIT/ML ~~LOC~~ SOLN
5.0000 [IU] | Freq: Three times a day (TID) | SUBCUTANEOUS | Status: DC
  Administered 2019-09-25: 5 [IU] via SUBCUTANEOUS

## 2019-09-25 MED ORDER — CYCLOSPORINE 0.05 % OP EMUL
1.0000 [drp] | Freq: Two times a day (BID) | OPHTHALMIC | Status: DC
  Administered 2019-09-25 – 2019-09-26 (×3): 1 [drp] via OPHTHALMIC
  Filled 2019-09-25 (×4): qty 1

## 2019-09-25 MED ORDER — ONDANSETRON HCL 4 MG/2ML IJ SOLN
4.0000 mg | Freq: Four times a day (QID) | INTRAMUSCULAR | Status: DC | PRN
  Administered 2019-09-25: 4 mg via INTRAVENOUS
  Filled 2019-09-25: qty 2

## 2019-09-25 MED ORDER — ACETAMINOPHEN 325 MG PO TABS
650.0000 mg | ORAL_TABLET | Freq: Three times a day (TID) | ORAL | Status: DC
  Administered 2019-09-25 – 2019-09-26 (×3): 650 mg via ORAL
  Filled 2019-09-25 (×3): qty 2

## 2019-09-25 MED ORDER — INSULIN ASPART 100 UNIT/ML ~~LOC~~ SOLN
0.0000 [IU] | Freq: Every day | SUBCUTANEOUS | Status: DC
  Administered 2019-09-25: 2 [IU] via SUBCUTANEOUS

## 2019-09-25 MED ORDER — GABAPENTIN 300 MG PO CAPS
600.0000 mg | ORAL_CAPSULE | Freq: Two times a day (BID) | ORAL | Status: DC
  Administered 2019-09-25 – 2019-09-26 (×3): 600 mg via ORAL
  Filled 2019-09-25 (×3): qty 2

## 2019-09-25 MED ORDER — POTASSIUM CHLORIDE 10 MEQ/100ML IV SOLN
10.0000 meq | INTRAVENOUS | Status: AC
  Administered 2019-09-25 (×4): 10 meq via INTRAVENOUS
  Filled 2019-09-25 (×4): qty 100

## 2019-09-25 MED ORDER — LIVING WELL WITH DIABETES BOOK
Freq: Once | Status: AC
  Filled 2019-09-25: qty 1

## 2019-09-25 NOTE — Progress Notes (Signed)
Received pt on Insulin drip @ 3.4 ml infusing.

## 2019-09-25 NOTE — Progress Notes (Addendum)
Inpatient Diabetes Program Recommendations  AACE/ADA: New Consensus Statement on Inpatient Glycemic Control (2015)  Target Ranges:  Prepandial:   less than 140 mg/dL      Peak postprandial:   less than 180 mg/dL (1-2 hours)      Critically ill patients:  140 - 180 mg/dL   Lab Results  Component Value Date   GLUCAP 211 (H) 09/25/2019   HGBA1C 12.7 (H) 09/25/2019    Review of Glycemic Control  Inpatient Diabetes Program Recommendations:   Noted patient continues on IV insulin per Endotool. K+ 2.8. Recommend holding IV insulin until K+ increases to 3.5. Secure chat sent to Dr. Alcario Drought and spoke with RN Gildardo Cranker Sison. When ready for transition from IV insulin to subcutaneous insulin, give basal insulin 2 hrs prior to IV insulin discontinued and cover CBG @ time of drip discontinued with correction. -Consider Levemir 25 units bid (50% home insulin dose) -Novolog 5 units tid meal coverage when able to eat (give if eats @ least 50% meals) -Novolog moderate correction tid + hs 0-5 units   Will follow during hospitalization.  Thank you, Nani Gasser. Agape Hardiman, RN, MSN, CDE  Diabetes Coordinator Inpatient Glycemic Control Team Team Pager [phone] (8am-5pm) 09/25/2019 10:15 AM

## 2019-09-25 NOTE — Progress Notes (Signed)
Pt. Requesting med for headache. On call for [facility] paged to make aware. Monterius Rolf, Katherine Roan

## 2019-09-25 NOTE — Progress Notes (Signed)
Insulin drip stopped @ 1548, Levermir 25u given 2hrs prior to Insulin drip d/c. Pt tolerated meal,ate 50% of her tray.

## 2019-09-25 NOTE — Progress Notes (Signed)
PROGRESS NOTE    [name]  NFA:[id number] DOB: 1956-05-17 DOA: 09/24/2019 PCP: [name], NP    Brief Narrative: 64 year old female with history of type 2 diabetes, diastolic heart failure, A. fib on Pradaxa and digoxin at home admitted with A. fib with RVR.  Patient had a fall 4 days prior to admission to the hospital   She was also found to be in mild DKA with A. fib RVR she is admitted for the same.  Assessment & Plan:   Principal Problem:   Atrial fibrillation with RVR (HCC) Active Problems:   Diastolic dysfunction   Diabetes mellitus type 2 in obese (HCC)   Acute kidney injury superimposed on CKD ([place])   Closed right ankle fracture   DKA, type 2 (HCC)   1 A. fib RVR-likely precipitated by DKA and right ankle fracture.  Patient received Cardizem 20 mg x 1. Restarted metoprolol. Add Cardizem 60 every 8. Continue Pradaxa. Check TSH Last echo was in 2019 with ejection fraction 45 to 50% with moderate hypokinesis of the anteroseptal myocardium.  We will repeat echo.  2 mild DKA-acidosis resolved beta hydroxybutyrate decreasing gap closed.  Will start her on half a dose of home dose of Levemir.  DC insulin drip.  Start carb modified diet.  3 epigastric pain-patient followed by Dr. Almyra Free.  Discussed with [name].  He will do EGD as an outpatient once her cardiac issues are resolved.    4 AKI on CKD stage III creatinine on admission 2.11 down to 1.89 with hydration and treatment of DKA.  Continue to hold diuretics and reevaluate in a.m. to restart diuretics.  5 non displaced right ankle fracture-patient fell 3 days prior to admission to hospital now with right ankle fracture has not seen Ortho yet will need to follow-up with Ortho upon discharge. X-ray shows non displaced fracture of the medial malleolus right foot  6 diastolic heart failure patient appears dry continue to hold diuretics.    Estimated body mass index is 42.49 kg/m as calculated from the  following:   Height as of 09/04/19: 5\' 6"  (1.676 m).   Weight as of this encounter: 119.4 kg.  DVT prophylaxis: Pradaxa Code Status: Full code   family Communication: None  disposition Plan: Patient came from home hopefully she can be discharged back to home she now has a right ankle fracture lives at home with her family.  Physical therapy consult is pending. Barrier to discharge is A. fib RVR need to get her rate under control prior to discharge She was admitted with DKA new right ankle fracture status post fall and A. fib RVR. Consultants:   None  Procedures: None Antimicrobials: None Subjective:  Resting in bed feeling hungry wants to eat was nauseous earlier has epigastric pain for the last few weeks  Objective: Vitals:   09/25/19 0433 09/25/19 0500 09/25/19 0557 09/25/19 0744  BP: 94/63   120/72  Pulse: 98 96    Resp:  (!) 21    Temp: 98.3 F (36.8 C)   98.1 F (36.7 C)  TempSrc: Oral   Oral  SpO2:  99% 97%   Weight: 119.4 kg       Intake/Output Summary (Last 24 hours) at 09/25/2019 1233 Last data filed at 09/25/2019 1200 Gross per 24 hour  Intake 731.66 ml  Output 360 ml  Net 371.66 ml   Filed Weights   09/25/19 0433  Weight: 119.4 kg    Examination:  General exam: Appears calm and  comfortable  Respiratory system: Clear to auscultation. Respiratory effort normal. Cardiovascular system: S1 & S2 heard, irregularly irregular no JVD, murmurs, rubs, gallops or clicks. No pedal edema. Gastrointestinal system: Abdomen is nondistended, soft and nontender. No organomegaly or masses felt. Normal bowel sounds heard. Central nervous system: Alert and oriented. No focal neurological deficits. Extremities: Right lower extremity covered with dressing skin: No rashes, lesions or ulcers Psychiatry: Judgement and insight appear normal. Mood & affect appropriate.     Data Reviewed: I have personally reviewed following labs and imaging studies  CBC: Recent Labs  Lab  09/24/19 1934 09/24/19 1951  WBC 11.3*  --   NEUTROABS 7.9*  --   HGB 15.6* 16.3*  HCT 44.8 48.0*  MCV 87.5  --   PLT 206  --    Basic Metabolic Panel: Recent Labs  Lab 09/24/19 1934 09/24/19 1934 09/24/19 1951 09/25/19 0009 09/25/19 0405 09/25/19 0631 09/25/19 1048  NA 129*   < > 130* 135 137 137 137  K 3.1*   < > 3.2* 3.3* 3.1* 2.8* 2.8*  CL 79*  --   --  89* 91* 92* 92*  CO2 33*  --   --  32 34* 33* 34*  GLUCOSE 672*  --   --  279* 201* 223* 201*  BUN 28*  --   --  27* 25* 24* 23  CREATININE 2.41*  --   --  2.11* 2.06* 1.94* 1.89*  CALCIUM 9.0  --   --  8.6* 8.7* 8.6* 8.5*   < > = values in this interval not displayed.   GFR: Estimated Creatinine Clearance: 40.1 mL/min (A) (by C-G formula based on SCr of 1.89 mg/dL (H)). Liver Function Tests: Recent Labs  Lab 09/24/19 1934  AST 16  ALT 17  ALKPHOS 77  BILITOT 1.4*  PROT 7.8  ALBUMIN 3.2*   No results for input(s): LIPASE, AMYLASE in the last 168 hours. No results for input(s): AMMONIA in the last 168 hours. Coagulation Profile: No results for input(s): INR, PROTIME in the last 168 hours. Cardiac Enzymes: No results for input(s): CKTOTAL, CKMB, CKMBINDEX, TROPONINI in the last 168 hours. BNP (last 3 results) No results for input(s): PROBNP in the last 8760 hours. HbA1C: Recent Labs    09/25/19 0009  HGBA1C 12.7*   CBG: Recent Labs  Lab 09/25/19 0740 09/25/19 0856 09/25/19 0958 09/25/19 1057 09/25/19 1200  GLUCAP 211* 216* 189* 202* 202*   Lipid Profile: No results for input(s): CHOL, HDL, LDLCALC, TRIG, CHOLHDL, LDLDIRECT in the last 72 hours. Thyroid Function Tests: No results for input(s): TSH, T4TOTAL, FREET4, T3FREE, THYROIDAB in the last 72 hours. Anemia Panel: No results for input(s): VITAMINB12, FOLATE, FERRITIN, TIBC, IRON, RETICCTPCT in the last 72 hours. Sepsis Labs: Recent Labs  Lab 09/24/19 1934 09/25/19 0009  LATICACIDVEN 2.7* 3.1*    Recent Results (from the past 240  hour(s))  SARS CORONAVIRUS 2 (TAT 6-24 HRS) Nasopharyngeal Nasopharyngeal Swab     Status: None   Collection Time: 09/24/19  8:41 PM   Specimen: Nasopharyngeal Swab  Result Value Ref Range Status   SARS Coronavirus 2 NEGATIVE NEGATIVE Final    Comment: (NOTE) SARS-CoV-2 target nucleic acids are NOT DETECTED. The SARS-CoV-2 RNA is generally detectable in upper and lower respiratory specimens during the acute phase of infection. Negative results do not preclude SARS-CoV-2 infection, do not rule out co-infections with other pathogens, and should not be used as the sole basis for treatment or other patient management decisions.  Negative results must be combined with clinical observations, patient history, and epidemiological information. The expected result is Negative. Fact Sheet for Patients: SugarRoll.be Fact Sheet for Healthcare Providers: https://www.[employer].com/ This test is not yet approved or cleared by the [place] FDA and  has been authorized for detection and/or diagnosis of SARS-CoV-2 by FDA under an Emergency Use Authorization (EUA). This EUA will remain  in effect (meaning this test can be used) for the duration of the COVID-19 declaration under Section 56 4(b)(1) of the Act, 21 U.S.C. section 360bbb-3(b)(1), unless the authorization is terminated or revoked sooner. Performed at [facility], [place] [address] [place], [place] [address]          Radiology Studies: DG Chest 2 View  Result Date: 09/24/2019 CLINICAL DATA:  Atrial fibrillation, shortness of breath EXAM: CHEST - 2 VIEW COMPARISON:  09/04/2019 FINDINGS: No new consolidation or edema. No pleural effusion or pneumothorax. Mild cardiomegaly is unchanged. No acute osseous abnormality IMPRESSION: No acute process chest.  Mild cardiomegaly. Electronically Signed   By: Macy Mis M.D.   On: 09/24/2019 20:20   DG Ankle Complete Right  Result Date:  09/24/2019 CLINICAL DATA:  Status post trauma. EXAM: RIGHT ANKLE - COMPLETE 3+ VIEW COMPARISON:  None. FINDINGS: A small nondisplaced fracture deformity is seen along the distal tip of the right medial malleolus. There is no evidence of dislocation. There is no evidence of arthropathy or other focal bone abnormality. Mild to moderate severity diffuse soft tissue swelling is seen. IMPRESSION: Nondisplaced fracture of the right medial malleolus. Electronically Signed   By: Virgina Norfolk M.D.   On: 09/24/2019 20:16   CT Head Wo Contrast  Result Date: 09/24/2019 CLINICAL DATA:  Status post fall. EXAM: CT HEAD WITHOUT CONTRAST TECHNIQUE: Contiguous axial images were obtained from the base of the skull through the vertex without intravenous contrast. COMPARISON:  January 29, 2017 FINDINGS: Brain: No evidence of acute infarction, hemorrhage, hydrocephalus, extra-axial collection or mass lesion/mass effect. Vascular: No hyperdense vessel or unexpected calcification. Skull: Normal. Negative for fracture or focal lesion. Sinuses/Orbits: No acute finding. Other: None. IMPRESSION: No acute intracranial pathology. Electronically Signed   By: Virgina Norfolk M.D.   On: 09/24/2019 21:12   DG Foot Complete Right  Result Date: 09/24/2019 CLINICAL DATA:  Status post fall. EXAM: RIGHT FOOT COMPLETE - 3+ VIEW COMPARISON:  None. FINDINGS: A small nondisplaced fracture deformity is seen along the distal tip of the right medial malleolus. The osseous structures of the right are intact. There is no evidence of dislocation. There is no evidence of arthropathy or other focal bone abnormality. Mild to moderate severity soft tissue swelling is seen surrounding the right ankle. IMPRESSION: Acute fracture of the right medial malleolus. Electronically Signed   By: Virgina Norfolk M.D.   On: 09/24/2019 20:19        Scheduled Meds: . dabigatran  75 mg Oral BID  . living well with diabetes book   Does not apply Once  . metoprolol  tartrate  25 mg Oral BID   Continuous Infusions: . sodium chloride Stopped (09/25/19 0351)  . dextrose 5 % and 0.45% NaCl 50 mL/hr at 09/25/19 0353  . insulin 3 Units/hr (09/25/19 1100)     LOS: 1 day     Georgette Shell, MD [facility]  If 7PM-7AM, please contact night-coverage [email or web address] Password [facility] 09/25/2019, 12:33 PM

## 2019-09-25 NOTE — Progress Notes (Signed)
Ordering K replacement: 4 runs IV.

## 2019-09-25 NOTE — Plan of Care (Signed)
  Problem: Activity: Goal: Risk for activity intolerance will decrease Outcome: Progressing   Problem: Safety: Goal: Ability to remain free from injury will improve Outcome: Progressing   

## 2019-09-26 LAB — CBC WITH DIFFERENTIAL/PLATELET
Abs Immature Granulocytes: 0.02 10*3/uL (ref 0.00–0.07)
Basophils Absolute: 0 10*3/uL (ref 0.0–0.1)
Basophils Relative: 0 %
Eosinophils Absolute: 0.2 10*3/uL (ref 0.0–0.5)
Eosinophils Relative: 2 %
HCT: 38.6 % (ref 36.0–46.0)
Hemoglobin: 13.1 g/dL (ref 12.0–15.0)
Immature Granulocytes: 0 %
Lymphocytes Relative: 33 %
Lymphs Abs: 3.4 10*3/uL (ref 0.7–4.0)
MCH: 29.8 pg (ref 26.0–34.0)
MCHC: 33.9 g/dL (ref 30.0–36.0)
MCV: 87.9 fL (ref 80.0–100.0)
Monocytes Absolute: 1.3 10*3/uL — ABNORMAL HIGH (ref 0.1–1.0)
Monocytes Relative: 13 %
Neutro Abs: 5.4 10*3/uL (ref 1.7–7.7)
Neutrophils Relative %: 52 %
Platelets: 178 10*3/uL (ref 150–400)
RBC: 4.39 MIL/uL (ref 3.87–5.11)
RDW: 12.4 % (ref 11.5–15.5)
WBC: 10.3 10*3/uL (ref 4.0–10.5)
nRBC: 0 % (ref 0.0–0.2)

## 2019-09-26 LAB — GLUCOSE, CAPILLARY
Glucose-Capillary: 282 mg/dL — ABNORMAL HIGH (ref 70–99)
Glucose-Capillary: 436 mg/dL — ABNORMAL HIGH (ref 70–99)

## 2019-09-26 LAB — BASIC METABOLIC PANEL
Anion gap: 10 (ref 5–15)
BUN: 19 mg/dL (ref 8–23)
CO2: 32 mmol/L (ref 22–32)
Calcium: 8.3 mg/dL — ABNORMAL LOW (ref 8.9–10.3)
Chloride: 96 mmol/L — ABNORMAL LOW (ref 98–111)
Creatinine, Ser: 1.77 mg/dL — ABNORMAL HIGH (ref 0.44–1.00)
GFR calc Af Amer: 35 mL/min — ABNORMAL LOW (ref >60)
GFR calc non Af Amer: 30 mL/min — ABNORMAL LOW (ref >60)
Glucose, Bld: 308 mg/dL — ABNORMAL HIGH (ref 70–99)
Potassium: 3.3 mmol/L — ABNORMAL LOW (ref 3.5–5.1)
Sodium: 138 mmol/L (ref 135–145)

## 2019-09-26 MED ORDER — DABIGATRAN ETEXILATE MESYLATE 150 MG PO CAPS: 150.0000 mg | ORAL_CAPSULE | Freq: Two times a day (BID) | ORAL | 2 refills | Status: DC

## 2019-09-26 MED ORDER — DILTIAZEM HCL 60 MG PO TABS: 60.0000 mg | ORAL_TABLET | Freq: Three times a day (TID) | ORAL | 3 refills | Status: DC

## 2019-09-26 MED ORDER — OXYCODONE HCL 5 MG PO TABS
5.0000 mg | ORAL_TABLET | Freq: Four times a day (QID) | ORAL | Status: AC | PRN
  Administered 2019-09-26 (×2): 5 mg via ORAL
  Filled 2019-09-26 (×2): qty 1

## 2019-09-26 MED ORDER — OXYCODONE HCL 5 MG PO TABS: 5.0000 mg | ORAL_TABLET | Freq: Three times a day (TID) | ORAL | 0 refills | Status: DC | PRN

## 2019-09-26 NOTE — Discharge Summary (Signed)
Physician Discharge Summary  [name] SWN:[id number] DOB: Feb 03, 1956 DOA: 09/24/2019  PCP: [name], NP  Admit date: 09/24/2019 Discharge date: 09/26/2019  Admitted From: Home Disposition: Home Recommendations for Outpatient Follow-up:  1. Follow up with PCP in 1-2 weeks 2. Please obtain BMP/CBC in one week 3. Please follow up with Ortho in 4 to 6 weeks  Home Health: Yes therapy recommended SNF patient adamantly refused SNF and wants to go home Equipment/Devices: Rolling walker, wheelchair with cushion Discharge Condition: Stable and improved CODE STATUS: Full code  diet recommendation: Cardiac diet Brief/Interim Summary:64 year old female with history of type 2 diabetes, diastolic heart failure, A. fib on Pradaxa and digoxin at home admitted with A. fib with RVR.  Patient had a fall 4 days prior to admission to the hospital   She was also found to be in mild DKA with A. fib RVR she is admitted for the same.   Discharge Diagnoses:  Principal Problem:   Atrial fibrillation with RVR (HCC) Active Problems:   Diastolic dysfunction   Diabetes mellitus type 2 in obese (HCC)   Acute kidney injury superimposed on CKD ([place])   Closed right ankle fracture   DKA, type 2 (HCC)   1 A. fib RVR-likely precipitated by DKA and right ankle fracture-patient was treated with Cardizem.  She was on digoxin prior to admit which was on hold during the hospital stay.  Metoprolol was continued as she was taking at home.  She is on Pradaxa 75 mg twice a day the dose was adjusted for her creatinine clearance and increased to 150 twice a day. Last echo was in 2019 with ejection fraction 45 to 50% with moderate hypokinesis of the anteroseptal myocardium.   2 mild DKA-acidosis resolved beta hydroxybutyrate normalized.  She was started on her home dose of insulin continue.  3 epigastric pain-patient followed by [name].  Discussed with [name].  He will do EGD as an outpatient once her  cardiac issues are resolved.    Asked her to follow-up with [name] as an outpatient.  4 AKI on CKD stage III creatinine on admission 2.11 down to 1.89 with hydration and treatment of DKA.  Creatinine is 1.77 on discharge.   5 non displaced right ankle fracture-patient fell 3 days prior to admission to hospital now with right ankle fracture .follow-up with Ortho as an outpatient.  X-ray shows non displaced fracture of the medial malleolus right foot.    6 diastolic heart failure continue diuretics.    Estimated body mass index is 42.88 kg/m as calculated from the following:   Height as of 09/04/19: '5\' 6"'$  (1.676 m).   Weight as of this encounter: 120.5 kg.  Discharge Instructions  Discharge Instructions    Call MD for:  persistant nausea and vomiting   Complete by: As directed    Call MD for:  severe uncontrolled pain   Complete by: As directed    Call MD for:  temperature >100.4   Complete by: As directed    Diet - low sodium heart healthy   Complete by: As directed    Diet - low sodium heart healthy   Complete by: As directed    Increase activity slowly   Complete by: As directed    Increase activity slowly   Complete by: As directed      Allergies as of 09/26/2019      Reactions   Sulfa Antibiotics Itching      Medication List  STOP taking these medications   digoxin 0.125 MG tablet Commonly known as: Lanoxin     TAKE these medications   Accu-Chek FastClix Lancets Misc Use as directed three times daily   albuterol 108 (90 Base) MCG/ACT inhaler Commonly known as: VENTOLIN HFA Inhale 2 puffs into the lungs every 4 (four) hours as needed for wheezing or shortness of breath.   B-D SINGLE USE SWABS REGULAR Pads   dabigatran 150 MG Caps capsule Commonly known as: Pradaxa Take 1 capsule (150 mg total) by mouth 2 (two) times daily. What changed:   medication strength  how much to take   diltiazem 60 MG tablet Commonly known as: CARDIZEM Take 1 tablet (60  mg total) by mouth every 8 (eight) hours.   DULoxetine 60 MG capsule Commonly known as: CYMBALTA Take 1 capsule (60 mg total) by mouth daily.   furosemide 40 MG tablet Commonly known as: Lasix Take 1 tablet (40 mg total) by mouth daily. What changed: when to take this   gabapentin 300 MG capsule Commonly known as: NEURONTIN Take 600 mg by mouth 2 (two) times daily.   Insulin Syringe-Needle U-100 31G X 5/16" 0.3 ML Misc Commonly known as: TRUEplus Insulin Syringe Use to administer insulin every day.   Levemir 100 UNIT/ML injection Generic drug: insulin detemir INJECT 0.4 MLS (40 UNITS TOTAL) INTO THE SKIN 2 (TWO) TIMES DAILY. What changed: See the new instructions.   Melatonin 5 MG Caps Take 1 capsule (5 mg total) by mouth at bedtime as needed. What changed: reasons to take this   metolazone 5 MG tablet Commonly known as: ZAROXOLYN Take 5 mg by mouth daily.   metoprolol tartrate 25 MG tablet Commonly known as: LOPRESSOR Take 1 tablet (25 mg total) by mouth 2 (two) times daily.   MULTIVITAMIN WOMEN PO Take 1 tablet by mouth daily.   nitroGLYCERIN 0.4 MG SL tablet Commonly known as: NITROSTAT Place 1 tablet (0.4 mg total) under the tongue every 5 (five) minutes x 3 doses as needed for chest pain.   NovoLOG 100 UNIT/ML injection Generic drug: insulin aspart INJECT 8 UNITS INTO THE SKIN 3 (THREE) TIMES DAILY WITH MEALS. TAKE ONLY WHEN YOUR EAT MORE THAN 50% OF YOUR MEALS What changed: See the new instructions.   oxyCODONE 5 MG immediate release tablet Commonly known as: Roxicodone Take 1 tablet (5 mg total) by mouth every 8 (eight) hours as needed.   pantoprazole 40 MG tablet Commonly known as: PROTONIX Take 1 tablet (40 mg total) by mouth 2 (two) times daily.   potassium chloride SA 20 MEQ tablet Commonly known as: KLOR-CON Take 1 tablet (20 mEq total) by mouth daily.   Restasis 0.05 % ophthalmic emulsion Generic drug: cycloSPORINE Place 1 drop into both eyes  2 (two) times daily.   True Metrix Blood Glucose Test test strip Generic drug: glucose blood Use as instructed to check blood sugar daily.   True Metrix Meter w/Device Kit Use to check blood sugar daily.   Trulicity 0.16 WF/0.9NA Sopn Generic drug: Dulaglutide INJECT 0.75 MG INTO THE SKIN ONCE A WEEK. What changed: See the new instructions.            Durable Medical Equipment  (From admission, onward)         Start     Ordered   09/26/19 1144  DME standard manual wheelchair with seat cushion  Once    Comments: Patient suffers from ankle fracture which impairs their ability to perform daily activities  like brushing teeth combing hair using the restroom in the home.  A walking aid will not resolve issue with performing activities of daily living. A wheelchair will allow patient to safely perform daily activities. Patient can safely propel the wheelchair in the home or has a caregiver who can provide assistance. Length of need '9 9 9 '$ days accessories: elevating leg rests (ELRs), wheel locks, extensions and anti-tippers.   09/26/19 1146   09/26/19 1143  DME Walker  Once    Question Answer Comment  Walker: With 5 Inch Wheels   Patient needs a walker to treat with the following condition Ankle fracture      09/26/19 1146         Follow-up Information    [name], NP. Go on 10/20/2019.   Specialty: Internal Medicine Why: '@2'$ :30pm Contact information: [address] [place] [address] [phone]        Charolette Forward, MD. Go on 10/02/2019.   Specialty: Cardiology Why: '@2'$ :30pm Contact information: [address] [place] [address] [phone]        Shona Needles, MD Follow up.   Specialty: Orthopedic Surgery Contact information: [place] [address] [phone]          Allergies  Allergen Reactions  . Sulfa Antibiotics Itching    Consultations:  None   Procedures/Studies: DG Chest 2  View  Result Date: 09/24/2019 CLINICAL DATA:  Atrial fibrillation, shortness of breath EXAM: CHEST - 2 VIEW COMPARISON:  09/04/2019 FINDINGS: No new consolidation or edema. No pleural effusion or pneumothorax. Mild cardiomegaly is unchanged. No acute osseous abnormality IMPRESSION: No acute process chest.  Mild cardiomegaly. Electronically Signed   By: Macy Mis M.D.   On: 09/24/2019 20:20   DG Ankle Complete Right  Result Date: 09/24/2019 CLINICAL DATA:  Status post trauma. EXAM: RIGHT ANKLE - COMPLETE 3+ VIEW COMPARISON:  None. FINDINGS: A small nondisplaced fracture deformity is seen along the distal tip of the right medial malleolus. There is no evidence of dislocation. There is no evidence of arthropathy or other focal bone abnormality. Mild to moderate severity diffuse soft tissue swelling is seen. IMPRESSION: Nondisplaced fracture of the right medial malleolus. Electronically Signed   By: Virgina Norfolk M.D.   On: 09/24/2019 20:16   CT Head Wo Contrast  Result Date: 09/24/2019 CLINICAL DATA:  Status post fall. EXAM: CT HEAD WITHOUT CONTRAST TECHNIQUE: Contiguous axial images were obtained from the base of the skull through the vertex without intravenous contrast. COMPARISON:  January 29, 2017 FINDINGS: Brain: No evidence of acute infarction, hemorrhage, hydrocephalus, extra-axial collection or mass lesion/mass effect. Vascular: No hyperdense vessel or unexpected calcification. Skull: Normal. Negative for fracture or focal lesion. Sinuses/Orbits: No acute finding. Other: None. IMPRESSION: No acute intracranial pathology. Electronically Signed   By: Virgina Norfolk M.D.   On: 09/24/2019 21:12   DG Chest Port 1 View  Result Date: 09/04/2019 CLINICAL DATA:  Shortness of breath, chest pain EXAM: PORTABLE CHEST 1 VIEW COMPARISON:  02/21/2019 FINDINGS: The heart size and mediastinal contours are within normal limits. Both lungs are clear. The visualized skeletal structures are unremarkable.  IMPRESSION: No active disease. Electronically Signed   By: Kathreen Devoid   On: 09/04/2019 12:34   DG Foot Complete Right  Result Date: 09/24/2019 CLINICAL DATA:  Status post fall. EXAM: RIGHT FOOT COMPLETE - 3+ VIEW COMPARISON:  None. FINDINGS: A small nondisplaced fracture deformity is seen along the distal tip of the  right medial malleolus. The osseous structures of the right are intact. There is no evidence of dislocation. There is no evidence of arthropathy or other focal bone abnormality. Mild to moderate severity soft tissue swelling is seen surrounding the right ankle. IMPRESSION: Acute fracture of the right medial malleolus. Electronically Signed   By: Virgina Norfolk M.D.   On: 09/24/2019 20:19    (Echo, Carotid, EGD, Colonoscopy, ERCP)    Subjective: Patient resting in bed in no acute distress She is adamant about not going to rehab She wants to go home she has multiple family members who can help her  Discharge Exam: Vitals:   09/26/19 0324 09/26/19 0842  BP: 112/66   Pulse:    Resp:  16  Temp: 97.9 F (36.6 C) (!) 97.5 F (36.4 C)  SpO2:  97%   Vitals:   09/25/19 2236 09/26/19 0041 09/26/19 0324 09/26/19 0842  BP:  122/67 112/66   Pulse:      Resp: '18 18  16  '$ Temp:  97.9 F (36.6 C) 97.9 F (36.6 C) (!) 97.5 F (36.4 C)  TempSrc:  Oral Oral Oral  SpO2:  100%  97%  Weight:   120.5 kg     General: Pt is alert, awake, not in acute distress Cardiovascular: RRR, S1/S2 +, no rubs, no gallops Respiratory: CTA bilaterally, no wheezing, no rhonchi Abdominal: Soft, NT, ND, bowel sounds + Extremities: Right ankle covered with dressing.  The results of significant diagnostics from this hospitalization (including imaging, microbiology, ancillary and laboratory) are listed below for reference.     Microbiology: Recent Results (from the past 240 hour(s))  SARS CORONAVIRUS 2 (TAT 6-24 HRS) Nasopharyngeal Nasopharyngeal Swab     Status: None   Collection Time: 09/24/19   8:41 PM   Specimen: Nasopharyngeal Swab  Result Value Ref Range Status   SARS Coronavirus 2 NEGATIVE NEGATIVE Final    Comment: (NOTE) SARS-CoV-2 target nucleic acids are NOT DETECTED. The SARS-CoV-2 RNA is generally detectable in upper and lower respiratory specimens during the acute phase of infection. Negative results do not preclude SARS-CoV-2 infection, do not rule out co-infections with other pathogens, and should not be used as the sole basis for treatment or other patient management decisions. Negative results must be combined with clinical observations, patient history, and epidemiological information. The expected result is Negative. Fact Sheet for Patients: SugarRoll.be Fact Sheet for Healthcare Providers: https://www.woods-mathews.com/ This test is not yet approved or cleared by the [place] FDA and  has been authorized for detection and/or diagnosis of SARS-CoV-2 by FDA under an Emergency Use Authorization (EUA). This EUA will remain  in effect (meaning this test can be used) for the duration of the COVID-19 declaration under Section 56 4(b)(1) of the Act, 21 U.S.C. section 360bbb-3(b)(1), unless the authorization is terminated or revoked sooner. Performed at [facility], [place] [address], [place], [place] [address]      Labs: BNP (last 3 results) Recent Labs    02/21/19 1116 09/04/19 1127 09/24/19 1935  BNP 29.1 45.8 93.2   Basic Metabolic Panel: Recent Labs  Lab 09/25/19 0009 09/25/19 0405 09/25/19 0631 09/25/19 1048 09/26/19 0354  NA 135 137 137 137 138  K 3.3* 3.1* 2.8* 2.8* 3.3*  CL 89* 91* 92* 92* 96*  CO2 32 34* 33* 34* 32  GLUCOSE 279* 201* 223* 201* 308*  BUN 27* 25* 24* 23 19  CREATININE 2.11* 2.06* 1.94* 1.89* 1.77*  CALCIUM 8.6* 8.7* 8.6* 8.5* 8.3*  Liver Function Tests: Recent Labs  Lab 09/24/19 1934  AST 16  ALT 17  ALKPHOS 77  BILITOT 1.4*  PROT 7.8  ALBUMIN 3.2*   No  results for input(s): LIPASE, AMYLASE in the last 168 hours. No results for input(s): AMMONIA in the last 168 hours. CBC: Recent Labs  Lab 09/24/19 1934 09/24/19 1951 09/25/19 1349 09/26/19 0354  WBC 11.3*  --  10.6* 10.3  NEUTROABS 7.9*  --   --  5.4  HGB 15.6* 16.3* 14.0 13.1  HCT 44.8 48.0* 39.6 38.6  MCV 87.5  --  86.5 87.9  PLT 206  --  187 178   Cardiac Enzymes: No results for input(s): CKTOTAL, CKMB, CKMBINDEX, TROPONINI in the last 168 hours. BNP: Invalid input(s): POCBNP CBG: Recent Labs  Lab 09/25/19 1251 09/25/19 1441 09/25/19 1546 09/25/19 2119 09/26/19 0558  GLUCAP 196* 215* 260* 222* 282*   D-Dimer No results for input(s): DDIMER in the last 72 hours. Hgb A1c Recent Labs    09/25/19 0009  HGBA1C 12.7*   Lipid Profile No results for input(s): CHOL, HDL, LDLCALC, TRIG, CHOLHDL, LDLDIRECT in the last 72 hours. Thyroid function studies No results for input(s): TSH, T4TOTAL, T3FREE, THYROIDAB in the last 72 hours.  Invalid input(s): FREET3 Anemia work up No results for input(s): VITAMINB12, FOLATE, FERRITIN, TIBC, IRON, RETICCTPCT in the last 72 hours. Urinalysis    Component Value Date/Time   COLORURINE YELLOW 09/25/2019 1231   APPEARANCEUR CLEAR 09/25/2019 1231   LABSPEC 1.015 09/25/2019 1231   PHURINE 6.0 09/25/2019 1231   GLUCOSEU >=500 (A) 09/25/2019 1231   HGBUR NEGATIVE 09/25/2019 1231   BILIRUBINUR NEGATIVE 09/25/2019 1231   BILIRUBINUR negative 09/24/2019 1742   KETONESUR NEGATIVE 09/25/2019 1231   PROTEINUR NEGATIVE 09/25/2019 1231   UROBILINOGEN 1.0 09/24/2019 1742   UROBILINOGEN 1.0 10/26/2014 0249   NITRITE NEGATIVE 09/25/2019 1231   LEUKOCYTESUR NEGATIVE 09/25/2019 1231   Sepsis Labs Invalid input(s): PROCALCITONIN,  WBC,  LACTICIDVEN Microbiology Recent Results (from the past 240 hour(s))  SARS CORONAVIRUS 2 (TAT 6-24 HRS) Nasopharyngeal Nasopharyngeal Swab     Status: None   Collection Time: 09/24/19  8:41 PM   Specimen:  Nasopharyngeal Swab  Result Value Ref Range Status   SARS Coronavirus 2 NEGATIVE NEGATIVE Final    Comment: (NOTE) SARS-CoV-2 target nucleic acids are NOT DETECTED. The SARS-CoV-2 RNA is generally detectable in upper and lower respiratory specimens during the acute phase of infection. Negative results do not preclude SARS-CoV-2 infection, do not rule out co-infections with other pathogens, and should not be used as the sole basis for treatment or other patient management decisions. Negative results must be combined with clinical observations, patient history, and epidemiological information. The expected result is Negative. Fact Sheet for Patients: SugarRoll.be Fact Sheet for Healthcare Providers: https://www.woods-mathews.com/ This test is not yet approved or cleared by the [place] FDA and  has been authorized for detection and/or diagnosis of SARS-CoV-2 by FDA under an Emergency Use Authorization (EUA). This EUA will remain  in effect (meaning this test can be used) for the duration of the COVID-19 declaration under Section 56 4(b)(1) of the Act, 21 U.S.C. section 360bbb-3(b)(1), unless the authorization is terminated or revoked sooner. Performed at [facility], [place] [address] [place], [place] [address]      Time coordinating discharge: 39 minutes  SIGNED:   Georgette Shell, MD  [facility] 09/26/2019, 11:51 AM

## 2019-09-26 NOTE — Discharge Instructions (Addendum)
Please be nonweightbearing to the right lower extremity follow-up with Ortho in 4 weeks  Local Endocrinologists [facility] [phone]) 1. Dr. Philemon Kingdom 2. Dr. Janie [facility] [phone]) 1. Dr. Delrae Rend [facility] [phone]) 1. Dr. Jacelyn Pi 2. Dr. Anda Kraft [facility] [phone]) 1. Dr. Daneil Dolin [facility] [phone]) [[place] office]  [phone]) [[place] office] 1. Dr. Lenna Sciara [place] 2. Dr. Mee Hives [facility]) [phone]) 1. Autumn Hudnall Ronnald Ramp), PA 2. Dr. Amalia Greenhouse 3. Dr. Marsh Dolly. [facility] [phone]) 1. Dr. Glade Lloyd [facility] [phone]) 1. Dr. Orville Govern 2. Dr. Lelon Huh 3. Dr. Jerelene Redden 4. Alwyn Ren, FNP Dr. Carolynn Serve. [place] [facility] [place] [phone]) Hemoglobin A1c Test Why am I having this test? You may have the hemoglobin A1c test (HbA1c test) done to: Evaluate your risk for developing diabetes (diabetes mellitus). Diagnose diabetes. Monitor long-term control of blood sugar (glucose) in people who have diabetes and help make treatment decisions. This test may be done with other blood glucose tests, such as fasting blood glucose and oral glucose tolerance tests. What is being tested? Hemoglobin is a type of protein in the blood that carries oxygen. Glucose attaches to hemoglobin to form glycated hemoglobin. This test checks the amount of glycated hemoglobin in your blood, which is a good indicator of the average amount of glucose in your blood during the past 2-3 months. What kind of sample is taken?  A blood sample is required for this test. It is usually collected by inserting a needle into a blood vessel. Tell a health care provider about: All medicines you are taking, including vitamins, herbs,  eye drops, creams, and over-the-counter medicines. Any blood disorders you have. Any surgeries you have had. Any medical conditions you have. Whether you are pregnant or may be pregnant. How are the results reported? Your results will be reported as a percentage that indicates how much of your hemoglobin has glucose attached to it (is glycated). Your health care provider will compare your results to normal ranges that were established after testing a large group of people (reference ranges). Reference ranges may vary among labs and hospitals. For this test, common reference ranges are: Adult or child without diabetes: 4-5.6%. Adult or child with diabetes and good blood glucose control: less than 7%. What do the results mean? If you have diabetes: A result of less than 7% is considered normal, meaning that your blood glucose is well controlled. A result higher than 7% means that your blood glucose is not well controlled, and your treatment plan may need to be adjusted. If you do not have diabetes: A result within the reference range is considered normal, meaning that you are not at high risk for diabetes. A result of 5.7-6.4% means that you have a high risk of developing diabetes, and you may have prediabetes. Prediabetes is the condition of having a blood glucose level that is higher than it should be, but not high enough for you to be diagnosed with diabetes. Having prediabetes puts you at risk for developing type 2 diabetes (type 2 diabetes mellitus). You may have more tests, including a repeat HbA1c test. Results of 6.5% or higher on two separate HbA1c tests mean that you have diabetes. You may have more tests to confirm the diagnosis. Abnormally low HbA1c values may be caused by: Pregnancy. Severe blood loss. Receiving donated blood (transfusions). Low red blood cell  count (anemia). Long-term kidney failure. Some unusual forms (variants) of hemoglobin. Talk with your health care provider  about what your results mean. Questions to ask your health care provider Ask your health care provider, or the department that is doing the test: When will my results be ready? How will I get my results? What are my treatment options? What other tests do I need? What are my next steps? Summary The hemoglobin A1c test (HbA1c test) may be done to evaluate your risk for developing diabetes, to diagnose diabetes, and to monitor long-term control of blood sugar (glucose) in people who have diabetes and help make treatment decisions. Hemoglobin is a type of protein in the blood that carries oxygen. Glucose attaches to hemoglobin to form glycated hemoglobin. This test checks the amount of glycated hemoglobin in your blood, which is a good indicator of the average amount of glucose in your blood during the past 2-3 months. Talk with your health care provider about what your results mean. This information is not intended to replace advice given to you by your health care provider. Make sure you discuss any questions you have with your health care provider. Document Revised: 07/20/2017 Document Reviewed: 03/20/2017 Elsevier Patient Education  [place].   Hypoglycemia Hypoglycemia is when the sugar (glucose) level in your blood is too low. Signs of low blood sugar may include:  Feeling: ? Hungry. ? Worried or nervous (anxious). ? Sweaty and clammy. ? Confused. ? Dizzy. ? Sleepy. ? Sick to your stomach (nauseous).  Having: ? A fast heartbeat. ? A headache. ? A change in your vision. ? Tingling or no feeling (numbness) around your mouth, lips, or tongue. ? Jerky movements that you cannot control (seizure).  Having trouble with: ? Moving (coordination). ? Sleeping. ? Passing out (fainting). ? Getting upset easily (irritability). Low blood sugar can happen to people who have diabetes and people who do not have diabetes. Low blood sugar can happen quickly, and it can be an  emergency. Treating low blood sugar Low blood sugar is often treated by eating or drinking something sugary right away, such as:  Fruit juice, 4-6 oz (120-150 mL).  Regular soda (not diet soda), 4-6 oz (120-150 mL).  Low-fat milk, 4 oz (120 mL).  Several pieces of hard candy.  Sugar or honey, 1 Tbsp (15 mL). Treating low blood sugar if you have diabetes If you can think clearly and swallow safely, follow the 15:15 rule:  Take 15 grams of a fast-acting carb (carbohydrate). Talk with your doctor about how much you should take.  Always keep a source of fast-acting carb with you, such as: ? Sugar tablets (glucose pills). Take 3-4 pills. ? 6-8 pieces of hard candy. ? 4-6 oz (120-150 mL) of fruit juice. ? 4-6 oz (120-150 mL) of regular (not diet) soda. ? 1 Tbsp (15 mL) honey or sugar.  Check your blood sugar 15 minutes after you take the carb.  If your blood sugar is still at or below 70 mg/dL (3.9 mmol/L), take 15 grams of a carb again.  If your blood sugar does not go above 70 mg/dL (3.9 mmol/L) after 3 tries, get help right away.  After your blood sugar goes back to normal, eat a meal or a snack within 1 hour.  Treating very low blood sugar If your blood sugar is at or below 54 mg/dL (3 mmol/L), you have very low blood sugar (severe hypoglycemia). This may also cause:  Passing out.  Jerky movements  you cannot control (seizure).  Losing consciousness (coma). This is an emergency. Do not wait to see if the symptoms will go away. Get medical help right away. Call your local emergency services (911 in the [place] Do not drive yourself to the hospital. If you have very low blood sugar and you cannot eat or drink, you may need a glucagon shot (injection). A family member or friend should learn how to check your blood sugar and how to give you a glucagon shot. Ask your doctor if you need to have a glucagon shot kit at home. Follow these instructions at home: General  instructions  Take over-the-counter and prescription medicines only as told by your doctor.  Stay aware of your blood sugar as told by your doctor.  Limit alcohol intake to no more than 1 drink a day for nonpregnant women and 2 drinks a day for men. One drink equals 12 oz of beer (355 mL), 5 oz of wine (148 mL), or 1 oz of hard liquor (44 mL).  Keep all follow-up visits as told by your doctor. This is important. If you have diabetes:   Follow your diabetes care plan as told by your doctor. Make sure you: ? Know the signs of low blood sugar. ? Take your medicines as told. ? Follow your exercise and meal plan. ? Eat on time. Do not skip meals. ? Check your blood sugar as often as told by your doctor. Always check it before and after exercise. ? Follow your sick day plan when you cannot eat or drink normally. Make this plan ahead of time with your doctor.  Share your diabetes care plan with: ? Your work or school. ? People you live with.  Check your pee (urine) for ketones: ? When you are sick. ? As told by your doctor.  Carry a card or wear jewelry that says you have diabetes. Contact a doctor if:  You have trouble keeping your blood sugar in your target range.  You have low blood sugar often. Get help right away if:  You still have symptoms after you eat or drink something sugary.  Your blood sugar is at or below 54 mg/dL (3 mmol/L).  You have jerky movements that you cannot control.  You pass out. These symptoms may be an emergency. Do not wait to see if the symptoms will go away. Get medical help right away. Call your local emergency services (911 in the [place] Do not drive yourself to the hospital. Summary  Hypoglycemia happens when the level of sugar (glucose) in your blood is too low.  Low blood sugar can happen to people who have diabetes and people who do not have diabetes. Low blood sugar can happen quickly, and it can be an emergency.  Make sure you know the  signs of low blood sugar and know how to treat it.  Always keep a source of sugar (fast-acting carb) with you to treat low blood sugar. This information is not intended to replace advice given to you by your health care provider. Make sure you discuss any questions you have with your health care provider. Document Revised: 11/28/2018 Document Reviewed: 09/10/2015 Elsevier Patient Education  2020 [employer].

## 2019-09-26 NOTE — Progress Notes (Signed)
Inpatient Diabetes Program Recommendations  AACE/ADA: New Consensus Statement on Inpatient Glycemic Control (2015)  Target Ranges:  Prepandial:   less than 140 mg/dL      Peak postprandial:   less than 180 mg/dL (1-2 hours)      Critically ill patients:  140 - 180 mg/dL   Lab Results  Component Value Date   GLUCAP 282 (H) 09/26/2019   HGBA1C 12.7 (H) 09/25/2019    Review of Glycemic Control Fall DKA right ankle fracture  DM 2 Home: Levemir 50 units bid, Novolog 5 units tid meal coverage, Trulicity 9.97 mg Weekly  Current Inpatient medications:  Levemir 25 units bid Novolog 0-15 units tid + hs Novolog 5 units tid meal coverage  A1c 12.7% this admission.  Inpatient Diabetes Program Recommendations:    Consider increasing Levemir to 32 units bid  Will follow during hospitalization. Will see pt today.  Thank you, Tama Headings RN, MSN, BC-ADM Inpatient Diabetes Coordinator Team Pager [phone] (8a-5p)

## 2019-09-26 NOTE — Progress Notes (Signed)
Pt c/o 8/10 leg and foot pain. Scheduled Tylenol and Gabapentin given earlier tonight. Pt states she does not think PRN Tylenol would help her pain. MD paged.

## 2019-09-26 NOTE — Evaluation (Signed)
Physical Therapy Evaluation Patient Details Name: [name] MRN: [id number] DOB: 09/19/1955 Today's Date: 09/26/2019   History of Present Illness  Patient is a 63 y/o female who presents with right ankle pain s/p fall. Found to be in DKA and Afib with RVR. Also noted to have non displaced right ankle fx at medial malleolus. Ortho consult pending. PMH includes diastolic CHF, COPD, morbid obesity, sleep apnea, HTN, HLD, DM, A-fib.  Clinical Impression  Patient presents with generalized weakness, pain in right ankle and impaired mobility s/p above. Pt reports using SPC at home for mobility and does her own ADLs PTA. Lives with daughter and 3 grandchildren (daughter/granddaughter work outside the home). Awaiting ortho consult and WB status/recommendations. At this time, pt tolerated standing transfer and taking a few steps to get to chair with Min A for balance. Despite recommendation to attempt transfer with NWB of RLE, pt unable. Discharge recommendation will be pending WB status. If pt allowed to place weight through RLE, will be safe to d/c home with use of RW and w/c. If pt is NWB RLE, will likely need SNF to maximize independence and mobility prior to return home. Hx of falls. Will follow acutely.     Follow Up Recommendations Supervision for mobility/OOB;Supervision/Assistance - 24 hour;SNF    Equipment Recommendations  Rolling walker with 5" wheels;Wheelchair cushion (measurements PT);Wheelchair (measurements PT)(pending WB status and ortho recommendations)    Recommendations for Other Services       Precautions / Restrictions Precautions Precautions: Fall Required Braces or Orthoses: Splint/Cast Restrictions Other Position/Activity Restrictions: No WB status in chart, assuming NWB until cleared by ortho      Mobility  Bed Mobility Overal bed mobility: Needs Assistance Bed Mobility: Supine to Sit     Supine to sit: Supervision;HOB elevated     General bed mobility  comments: Able to get to EOB with use of rail. No dizziness.  Transfers Overall transfer level: Needs assistance Equipment used: Rolling walker (2 wheeled) Transfers: Sit to/from Stand Sit to Stand: Min assist;From elevated surface         General transfer comment: Assist to power to standing with cues for use of momentum; instructed pt to be NWB RLE however pt unable to do so during mobility.  Ambulation/Gait Ambulation/Gait assistance: Min assist Gait Distance (Feet): 4 Feet Assistive device: Rolling walker (2 wheeled) Gait Pattern/deviations: Wide base of support;Trunk flexed Gait velocity: decreased   General Gait Details: Able to take a few steps to get to chair with RW for support, not able to maintain NWB RLE. Limited mobility until clearance from ortho on WB status.  Stairs            Wheelchair Mobility    Modified Rankin (Stroke Patients Only)       Balance Overall balance assessment: Needs assistance Sitting-balance support: Feet supported;No upper extremity supported Sitting balance-Leahy Scale: Good Sitting balance - Comments: Supervision for safety.   Standing balance support: During functional activity Standing balance-Leahy Scale: Poor Standing balance comment: Requires UE support in standing; not able to maintain NWB RLE without assist.                             Pertinent Vitals/Pain Pain Assessment: 0-10 Pain Score: 8  Pain Location: right ankle Pain Descriptors / Indicators: Sore;Aching Pain Intervention(s): Repositioned;Monitored during session;Limited activity within patient's tolerance    Home Living Family/patient expects to be discharged to:: Private residence Living Arrangements: Children(daughter and  3 grandkids) Available Help at Discharge: Family;Available PRN/intermittently Type of Home: Apartment Home Access: Stairs to enter Entrance Stairs-Rails: Left Entrance Stairs-Number of Steps: 1 + 1+ ! and then 2 steps  up Home Layout: One level Home Equipment: [place] - single point;Walker - 4 wheels;Shower seat      Prior Function Level of Independence: Needs assistance   Gait / Transfers Assistance Needed: walks with cane usually  ADL's / Homemaking Assistance Needed: Does own ADls, cooks/cleans.  Comments: Daughter works and 75 y/o granddaughter and other grandchildren are in school online at home.     Hand Dominance   Dominant Hand: Right    Extremity/Trunk Assessment   Upper Extremity Assessment Upper Extremity Assessment: Defer to OT evaluation    Lower Extremity Assessment Lower Extremity Assessment: RLE deficits/detail;Generalized weakness RLE Deficits / Details: RLE in splint. Able to wiggles toes without difficulty. RLE: Unable to fully assess due to immobilization    Cervical / Trunk Assessment Cervical / Trunk Assessment: Normal  Communication   Communication: No difficulties  Cognition Arousal/Alertness: Awake/alert Behavior During Therapy: WFL for tasks assessed/performed Overall Cognitive Status: Within Functional Limits for tasks assessed                                 General Comments: for basic mobility tasks.      General Comments General comments (skin integrity, edema, etc.): VSS throughout.    Exercises     Assessment/Plan    PT Assessment Patient needs continued PT services  PT Problem List Decreased strength;Decreased mobility;Pain;Decreased balance;Decreased activity tolerance;Decreased range of motion;Decreased knowledge of precautions       PT Treatment Interventions Therapeutic activities;Gait training;Therapeutic exercise;Patient/family education;Balance training;Functional mobility training;Wheelchair mobility training;DME instruction    PT Goals (Current goals can be found in the Care Plan section)  Acute Rehab PT Goals Patient Stated Goal: to go home PT Goal Formulation: With patient Time For Goal Achievement:  10/10/19 Potential to Achieve Goals: Fair    Frequency Min 3X/week   Barriers to discharge Decreased caregiver support daughter works, grandchildren are in school online at home    Co-evaluation               AM-PAC PT "6 Clicks" Mobility  Outcome Measure Help needed turning from your back to your side while in a flat bed without using bedrails?: None Help needed moving from lying on your back to sitting on the side of a flat bed without using bedrails?: A Little Help needed moving to and from a bed to a chair (including a wheelchair)?: A Little Help needed standing up from a chair using your arms (e.g., wheelchair or bedside chair)?: A Little Help needed to walk in hospital room?: A Little Help needed climbing 3-5 steps with a railing? : A Lot 6 Click Score: 18    End of Session Equipment Utilized During Treatment: Gait belt Activity Tolerance: Patient tolerated treatment well;Patient limited by pain Patient left: in chair;with call bell/phone within reach;with chair alarm set Nurse Communication: Mobility status PT Visit Diagnosis: Pain;Difficulty in walking, not elsewhere classified (R26.2);Muscle weakness (generalized) (M62.81) Pain - Right/Left: Right Pain - part of body: Ankle and joints of foot    Time: 0753-0820 PT Time Calculation (min) (ACUTE ONLY): 27 min   Charges:   PT Evaluation $PT Eval Moderate Complexity: 1 Mod PT Treatments $Therapeutic Activity: 8-22 mins        Marisa Severin, PT, DPT  Acute Rehabilitation Services Pager [phone] Office [place] 09/26/2019, 10:34 AM

## 2019-09-26 NOTE — TOC Transition Note (Addendum)
Transition of Care [facility]) - CM/SW Discharge Note   Patient Details  Name: [name] MRN: [id number] Date of Birth: [birth year]  Transition of Care [facility]) CM/SW Contact:  Zenon Mayo, RN Phone Number: 09/26/2019, 12:24 PM   Clinical Narrative:    NCM spoke with patient, offered choice, she state she will try [facility],  NCM made referral to [place] with [facility], she is able to take referral for HHPT, HHAIDE.  Soc will begin 24 to 48 hrs post dc.  NCM made referral to [facility] with [employer] for w/chair and rolling walker, he will bring up to room prior to dc. Patient states she wants the DME to be delivered to her home.  NCM confirmed address with patient.  NCM informed Zach with [employer] that patient wants DME to be delivered to her home.    Final next level of care: [place] Barriers to Discharge: No Barriers Identified   Patient Goals and CMS Choice Patient states their goals for this hospitalization and ongoing recovery are:: to get back to where she was before CMS Medicare.gov Compare Post Acute Care list provided to:: Patient Choice offered to / list presented to : Patient  Discharge Placement                       Discharge Plan and Services                DME Arranged: Walker rolling, Lightweight manual wheelchair with seat cushion 3 n 1 DME Agency: [employer] Date DME Agency Contacted: 09/26/19 Time DME Agency Contacted: [id number] Representative spoke with at DME Agency: [place]: PT, Nurse's Aide [place] Agency: [facility] (formerly [employer]) Date [place]: 09/26/19 Time [place]: 1224 Representative spoke with at [place]: [place] ([place]) Interventions     Readmission Risk Interventions No flowsheet data found.

## 2019-09-26 NOTE — Evaluation (Signed)
Occupational Therapy Evaluation and Discharge Patient Details Name: [name] MRN: [id number] DOB: 08-May-1956 Today's Date: 09/26/2019    History of Present Illness Patient is a 64 y/o female who presents with right ankle pain s/p fall. Found to be in DKA and Afib with RVR. Also noted to have non displaced right ankle fx at medial malleolus. Ortho consult pending. PMH includes diastolic CHF, COPD, morbid obesity, sleep apnea, HTN, HLD, DM, A-fib.   Clinical Impression   This 64 yo female admitted with above and normal PLOF is totally independent to Mod I with all basic and IADLs presents to acute OT with increased need for A when up on her feet for tranfers due to pain in RLE, decreased balance, decreased mobility, and decreased ability to maintain NWB'ing RLE. She is D/C home today so we will D/C from acute OT.    Follow Up Recommendations  No OT follow up;Other (comment)(assist anytime she up on her feet)    Equipment Recommendations  3 in 1 bedside commode       Precautions / Restrictions Precautions Precautions: Fall Required Braces or Orthoses: Splint/Cast Restrictions Weight Bearing Restrictions: Yes RLE Weight Bearing: Non weight bearing Other Position/Activity Restrictions: No WB status in chart, assuming NWB until cleared by ortho (pt is to follow up with them in office as an outpatient)      Mobility Bed Mobility               General bed mobility comments: Pt up in recliner upon arrival  Transfers                 General transfer comment: Gave pt handout on up/down steps and curb with W/C    Balance Overall balance assessment: Needs assistance Sitting-balance support: No upper extremity supported;Feet supported Sitting balance-Leahy Scale: Good     Standing balance support: Bilateral upper extremity supported Standing balance-Leahy Scale: Poor Standing balance comment: Requires UE support in standing; not able to maintain NWB RLE without  assist.                           ADL either performed or assessed with clinical judgement   ADL                                         General ADL Comments: We spoke about her LB dressing and she reports she can bend forward and reach her foot to get items started over them. I spoke with her about the best at this time would be for her not to put weight through her right lower extermity until cleared by ortho, but knowing that patient cannot maintain NWB'ing on RLE and will have to transfer to/from W/C and 3n1 I asked for a post op shoe to protect/keep dressing on foot clean and told patient that we do not recommend her walking on RLE until cleared by ortho and to limit her transfers since she cannot maintain NWBing.     Vision Patient Visual Report: No change from baseline        Hand Dominance Right   Extremity/Trunk Assessment Upper Extremity Assessment Upper Extremity Assessment: Overall WFL for tasks assessed           Communication Communication Communication: No difficulties   Cognition Arousal/Alertness: Awake/alert Behavior During Therapy: WFL for tasks assessed/performed Overall Cognitive Status: Within Functional  Limits for tasks assessed                                                Home Living Family/patient expects to be discharged to:: Private residence Living Arrangements: Children(dtr and 3 grandkids) Available Help at Discharge: Family;Available PRN/intermittently Type of Home: Apartment Home Access: Stairs to enter Entrance Stairs-Number of Steps: 1 + 1+ down then a landing then 2 steps up Entrance Stairs-Rails: Left Home Layout: One level     Bathroom Shower/Tub: Teacher, early years/pre: Handicapped height(both)     Home Equipment: Cane - single point;Walker - 4 wheels;Shower seat          Prior Functioning/Environment Level of Independence: Needs assistance  Gait / Transfers  Assistance Needed: walks with cane usually ADL's / Homemaking Assistance Needed: Does own ADls, cooks/cleans.   Comments: Daughter works and 85 y/o granddaughter and other grandchildren are in school online at home.        OT Problem List: Decreased range of motion;Impaired balance (sitting and/or standing);Obesity;Pain;Decreased knowledge of use of DME or AE         OT Goals(Current goals can be found in the care plan section) Acute Rehab OT Goals Patient Stated Goal: to go home  OT Frequency:                AM-PAC OT "6 Clicks" Daily Activity     Outcome Measure Help from another person eating meals?: None Help from another person taking care of personal grooming?: A Little Help from another person toileting, which includes using toliet, bedpan, or urinal?: A Lot Help from another person bathing (including washing, rinsing, drying)?: A Lot Help from another person to put on and taking off regular upper body clothing?: A Little Help from another person to put on and taking off regular lower body clothing?: A Lot 6 Click Score: 16   End of Session Nurse Communication: (made CM aware pt needs DME delivered to her home due to car not big enough to carrry it)  Activity Tolerance:   Patient left: in chair;with call bell/phone within reach  OT Visit Diagnosis: Other abnormalities of gait and mobility (R26.89);Unsteadiness on feet (R26.81);Pain Pain - Right/Left: Right Pain - part of body: Ankle and joints of foot                Time: 1335-1356 OT Time Calculation (min): 21 min Charges:  OT General Charges $OT Visit: 1 Visit OT Evaluation $OT Eval Moderate Complexity: 1 Mod  Cathy OTR/L Acute [employer] Pager [phone] Office [phone]     09/26/2019, 3:47 PM

## 2019-09-26 NOTE — Plan of Care (Signed)
  Problem: Education: Goal: Knowledge of General Education information will improve Description: Including pain rating scale, medication(s)/side effects and non-pharmacologic comfort measures Outcome: Adequate for Discharge   Problem: Health Behavior/Discharge Planning: Goal: Ability to manage health-related needs will improve Outcome: Adequate for Discharge   Problem: Clinical Measurements: Goal: Ability to maintain clinical measurements within normal limits will improve Outcome: Adequate for Discharge Goal: Will remain free from infection Outcome: Adequate for Discharge Goal: Diagnostic test results will improve Outcome: Adequate for Discharge Goal: Respiratory complications will improve Outcome: Adequate for Discharge Goal: Cardiovascular complication will be avoided Outcome: Adequate for Discharge   Problem: Activity: Goal: Risk for activity intolerance will decrease Outcome: Adequate for Discharge   Problem: Nutrition: Goal: Adequate nutrition will be maintained Outcome: Adequate for Discharge   Problem: Coping: Goal: Level of anxiety will decrease Outcome: Adequate for Discharge   Problem: Elimination: Goal: Will not experience complications related to bowel motility Outcome: Adequate for Discharge Goal: Will not experience complications related to urinary retention Outcome: Adequate for Discharge   Problem: Pain Managment: Goal: General experience of comfort will improve Outcome: Adequate for Discharge   Problem: Safety: Goal: Ability to remain free from injury will improve Outcome: Adequate for Discharge   Problem: Skin Integrity: Goal: Risk for impaired skin integrity will decrease Outcome: Adequate for Discharge   Problem: Education: Goal: Ability to demonstrate management of disease process will improve Outcome: Adequate for Discharge Goal: Ability to verbalize understanding of medication therapies will improve Outcome: Adequate for Discharge Goal:  Individualized Educational Video(s) Outcome: Adequate for Discharge   Problem: Activity: Goal: Capacity to carry out activities will improve Outcome: Adequate for Discharge   Problem: Cardiac: Goal: Ability to achieve and maintain adequate cardiopulmonary perfusion will improve Outcome: Adequate for Discharge   

## 2019-09-26 NOTE — Consult Note (Signed)
   [facility] CM Inpatient Consult   09/26/2019  Rhianon Zabawa [birth year] [id number]   Patient reviewed for disposition.  PT is recommending a skilled nursing facility stay however patient desires to go home with home health.  Patient evaluated for community based chronic disease management services with [facility] as a benefit of patient's [employer]. Spoke with patient at bedside phone number to explain [facility] services.  Explained the difference between home health care services and chronic care management.  Patient gave verbal consent for post hospital follow up.  Patient will receive post hospital discharge call and for assessments fo community needs for care/disease management.  Patient has a past history with [place] management with [facility] and [facility] Pharmacist.  Primary Care Provider:  Juluis Mire, NP at [facility]  Of note, [facility] services does not replace or interfere with any services that are arranged by inpatient Transition of Care [TOC] team     For additional questions or referrals please contact:    Natividad Brood, RN BSN [facility] Liaison  [phone] business mobile phone Toll free office [phone]  Fax number: [phone] [email or web address]  [email or web address]

## 2019-09-26 NOTE — Progress Notes (Signed)
Orthopedic Tech Progress Note Patient Details:  [name] [birth year] [id number]  Ortho Devices Type of Ortho Device: Postop shoe/boot Ortho Device/Splint Location: RLE Ortho Device/Splint Interventions: Ordered, Application   Post Interventions Patient Tolerated: Well Instructions Provided: Care of device, Adjustment of device   [name] 09/26/2019, 3:30 PM

## 2019-09-26 NOTE — Progress Notes (Signed)
Inpatient Diabetes Program Recommendations  AACE/ADA: New Consensus Statement on Inpatient Glycemic Control (2015)  Target Ranges:  Prepandial:   less than 140 mg/dL      Peak postprandial:   less than 180 mg/dL (1-2 hours)      Critically ill patients:  140 - 180 mg/dL   Lab Results  Component Value Date   GLUCAP 436 (H) 09/26/2019   HGBA1C 12.7 (H) 09/25/2019    Spoke with patient regarding diabetes management. Verified home medications and admits to missing doses.  Reviewed patient's current A1c of 12.7%. Explained what a A1c is and what it measures. Also reviewed goal A1c with patient, importance of good glucose control @ home, and blood sugar goals. Reviewed patho of DM, need for insulin, role of pancreas, signs and symptoms of hypo vs hyper glycemia, vascular changes and commorbidities.  Patient has a meter and supplies. Encouraged to check 2-3 times per day or as needed and when to call MD.  Admits to drinking sodas, sweet tea and juice. Reviewed alternatives for sugary beverages, encouraged elimination and ways of reducing carbohydrate intake, and reviewed plate method.   Patient did explain that when going grocery shopping she often feels weak, sweaty and dizzy after a few minutes of walking in the store; to feel better she usually drinks the sugary beverages to help. When asked, patient has never had lows, however, has never checked during any of these episodes. Reviewed hypoglycemia, especially being post fall, and associated interventions. Encouraged to obtain another meter to keep with her so that if she experiences this again to check a blood sugar. Luckily doses were decreased for discharge.   Stressed the importance of follow up with PCP and attached endo list to DC summary. Reviewed goals, when to call MD and to take insulin as prescribed. Patient has no further questions.   Thanks, Bronson Curb, MSN, RNC-OB Diabetes Coordinator [phone] (8a-5p)

## 2019-09-26 NOTE — Progress Notes (Signed)
After Visit Summary reviewed with patient and friend at bedside. PIVs removed. Vitals stable. Walked patient in wheelchair to exit where friend was waiting in car.

## 2019-09-29 ENCOUNTER — Other Ambulatory Visit: Payer: Self-pay

## 2019-09-29 ENCOUNTER — Telehealth: Payer: Self-pay

## 2019-09-29 NOTE — Telephone Encounter (Signed)
Transition Care Management Follow-up Telephone Call Date of discharge and from where: 09/26/2019, [facility]   Call placed to patient # [phone], message left with call back requested to this CM.  Patient has appt scheduled at [facility] 10/20/2019

## 2019-09-29 NOTE — Patient Outreach (Addendum)
[facility]) Care Management  09/29/2019  [name] 09-24-1955 [id number]   Referral Date: 09/26/19 Referral Source: Hospital Liaison Referral Reason: Hospital discharge 09/26/19-DKA.  EMMI- General Discharge RED ON EMMI ALERT Day # 1 Date: 09/28/19 Red Alert Reason:  Scheduled follow up? no    Outreach Attempt: No answer.  HIPAA compliant voice message left.   Plan: RN CM will attempt again within 4 business days and send letter.   Jone Baseman, RN, MSN [facility] Care Management Care Management Coordinator Direct Line [phone] Toll Free: [phone]  Fax: [phone]

## 2019-09-30 ENCOUNTER — Other Ambulatory Visit: Payer: Self-pay

## 2019-09-30 ENCOUNTER — Telehealth: Payer: Self-pay

## 2019-09-30 NOTE — Patient Outreach (Signed)
[facility]) Care Management  09/30/2019  Ravenna Legore 27-Apr-1956 [id number]   Referral Date: 09/26/19 Referral Source: Hospital Liaison Referral Reason: Hospital discharge 09/26/19-DKA.  EMMI- General Discharge RED ON EMMI ALERT Day # 1 Date:09/28/19 Red Alert Reason: Scheduled follow up? no    Outreach Attempt: No answer.  HIPAA compliant voice message left.   Plan: RN CM will attempt again within 4 business days.  Jone Baseman, RN, MSN [facility] Care Management Coordinator Direct Line [phone] Cell [phone] Toll Free: [phone]  Fax: [phone]

## 2019-09-30 NOTE — Telephone Encounter (Signed)
Call placed to [facility] to check on status of home health referral, spoke to [place] who said that the patient is a " non admit" she informed [facility] that she already had someone helping when they contacted her on 09/26/2019.   Explained to  her that the patient just informed us that she has not heard from [facility] and would like services.  Gabriel Cirri said that she would need to speak to her manager and call this CM back with an update.

## 2019-09-30 NOTE — Telephone Encounter (Signed)
Transition Care Management Follow-up Telephone Call  Date of discharge and from where: 09/26/2019 [facility]  How have you been since you were released from the hospital? Stated " but not feeling that good"/ no acute symptoms verbalized Made pt aware to return to ED if condition worsen or develop any chest pain, SOB, difficulty breathing, weaknesses ,   Confusion, or severe pain. Verbalized understanding   Any questions or concerns?   None   Items Reviewed:  Did the pt receive and understand the discharge instructions provided?   Yes  Medications obtained and verified?   Obtained only Pradexa, today patient daughter called pharmacy stated she will pick up the Cardizem and Roxycodone. Medications were verified with patient and pt daughter.   Any new allergies since your discharge?    None verbalized  Dietary orders reviewed?  Yes  Do you have support at home?    Daughter   Functional Questionnaire: (I = Independent and D = Dependent) ADLs: D  Bathing/Dressing- D  Meal Prep- D  Eating- I  Maintaining continence- I  Transferring/Ambulation- whealchair  Managing Meds- D  Follow up appointments reviewed:   PCP Hospital f/u appt confirmed? Yes  Scheduled to see NP Juluis Mire on 10/07/2019 @ 10:50am  Specialist Hospital f/u appt confirmed?  Yes Scheduled to see Podiatry  On 10/06/2019 Cardiology scheduled on 10/02/2019 at 2:30pm Gastro/ appt not scheduled at this time   Are transportation arrangements needed?   No    If their condition worsens, is the pt aware to call PCP or go to the Emergency Dept.?   YES> PT IS AWARE TO GO TO ED IF CONDITION WORSEN   Was the patient provided with contact information for the PCP's office or ED? {  YES Was to pt encouraged to call back with questions or concerns? YES   Other   Patient was discharged with Home Health/ stated waiting for someone to call yesterday but did not/ Will follow/ Message given to [place]. to f /u

## 2019-10-01 ENCOUNTER — Other Ambulatory Visit: Payer: Self-pay

## 2019-10-01 NOTE — Telephone Encounter (Signed)
Call received from Darlene/[facility].  She explained that they have reversed the non admit status and are waiting for authorization for the PT.  She hopes to be able to start services 10/06/2019/  She noted that the home health aide services will be delayed due to staffing.  She will call the patient and provide an update.

## 2019-10-01 NOTE — Telephone Encounter (Signed)
Call to [facility], spoke to Friend who will have Darlene/Nurse Manager return the call to this CM about the status of the referral.

## 2019-10-01 NOTE — Patient Outreach (Signed)
[facility]) Care Management  10/01/2019  [name] 12/16/1955 [id number]   Referral Date:09/26/19 Referral Source:Hospital Liaison Referral Reason:Hospital discharge 09/26/19-DKA.  EMMI-General Discharge RED ON EMMI ALERT Day #1 Date:09/28/19 Red Alert Reason:Scheduled follow up? no    Outreach Attempt:No answer. HIPAA compliant voice message left.   Plan:RN CM will wait return call. If no return call will close case.    Jone Baseman, RN, MSN [facility] Care Management Coordinator Direct Line [phone] Cell [phone] Toll Free: [phone]  Fax: [phone]

## 2019-10-02 DIAGNOSIS — I5020 Unspecified systolic (congestive) heart failure: Secondary | ICD-10-CM | POA: Diagnosis not present

## 2019-10-02 DIAGNOSIS — I890 Lymphedema, not elsewhere classified: Secondary | ICD-10-CM | POA: Diagnosis not present

## 2019-10-02 DIAGNOSIS — N189 Chronic kidney disease, unspecified: Secondary | ICD-10-CM | POA: Diagnosis not present

## 2019-10-02 DIAGNOSIS — E785 Hyperlipidemia, unspecified: Secondary | ICD-10-CM | POA: Diagnosis not present

## 2019-10-02 DIAGNOSIS — I482 Chronic atrial fibrillation, unspecified: Secondary | ICD-10-CM | POA: Diagnosis not present

## 2019-10-02 DIAGNOSIS — E119 Type 2 diabetes mellitus without complications: Secondary | ICD-10-CM | POA: Diagnosis not present

## 2019-10-02 DIAGNOSIS — I38 Endocarditis, valve unspecified: Secondary | ICD-10-CM | POA: Diagnosis not present

## 2019-10-02 DIAGNOSIS — I10 Essential (primary) hypertension: Secondary | ICD-10-CM | POA: Diagnosis not present

## 2019-10-06 DIAGNOSIS — S8251XA Displaced fracture of medial malleolus of right tibia, initial encounter for closed fracture: Secondary | ICD-10-CM | POA: Diagnosis not present

## 2019-10-07 ENCOUNTER — Other Ambulatory Visit: Payer: Self-pay

## 2019-10-07 ENCOUNTER — Encounter (INDEPENDENT_AMBULATORY_CARE_PROVIDER_SITE_OTHER): Payer: Self-pay | Admitting: Primary Care

## 2019-10-07 ENCOUNTER — Ambulatory Visit (INDEPENDENT_AMBULATORY_CARE_PROVIDER_SITE_OTHER): Payer: Medicare HMO | Admitting: Primary Care

## 2019-10-07 DIAGNOSIS — N183 Chronic kidney disease, stage 3 unspecified: Secondary | ICD-10-CM | POA: Diagnosis not present

## 2019-10-07 DIAGNOSIS — E1122 Type 2 diabetes mellitus with diabetic chronic kidney disease: Secondary | ICD-10-CM | POA: Diagnosis not present

## 2019-10-07 DIAGNOSIS — E119 Type 2 diabetes mellitus without complications: Secondary | ICD-10-CM | POA: Diagnosis not present

## 2019-10-07 DIAGNOSIS — N179 Acute kidney failure, unspecified: Secondary | ICD-10-CM

## 2019-10-07 DIAGNOSIS — J449 Chronic obstructive pulmonary disease, unspecified: Secondary | ICD-10-CM | POA: Diagnosis not present

## 2019-10-07 DIAGNOSIS — Z09 Encounter for follow-up examination after completed treatment for conditions other than malignant neoplasm: Secondary | ICD-10-CM

## 2019-10-07 DIAGNOSIS — I482 Chronic atrial fibrillation, unspecified: Secondary | ICD-10-CM | POA: Diagnosis not present

## 2019-10-07 DIAGNOSIS — M1990 Unspecified osteoarthritis, unspecified site: Secondary | ICD-10-CM | POA: Diagnosis not present

## 2019-10-07 DIAGNOSIS — E1143 Type 2 diabetes mellitus with diabetic autonomic (poly)neuropathy: Secondary | ICD-10-CM | POA: Diagnosis not present

## 2019-10-07 DIAGNOSIS — S8254XD Nondisplaced fracture of medial malleolus of right tibia, subsequent encounter for closed fracture with routine healing: Secondary | ICD-10-CM | POA: Diagnosis not present

## 2019-10-07 DIAGNOSIS — N189 Chronic kidney disease, unspecified: Secondary | ICD-10-CM

## 2019-10-07 DIAGNOSIS — I5043 Acute on chronic combined systolic (congestive) and diastolic (congestive) heart failure: Secondary | ICD-10-CM | POA: Diagnosis not present

## 2019-10-07 DIAGNOSIS — I130 Hypertensive heart and chronic kidney disease with heart failure and stage 1 through stage 4 chronic kidney disease, or unspecified chronic kidney disease: Secondary | ICD-10-CM | POA: Diagnosis not present

## 2019-10-07 NOTE — Progress Notes (Signed)
Virtual Visit via Telephone Note  I connected with [name] on 10/07/19 at 10:50 AM EST by telephone and verified that I am speaking with the correct person using two identifiers.   I discussed the limitations, risks, security and privacy concerns of performing an evaluation and management service by telephone and the availability of in person appointments. I also discussed with the patient that there may be a patient responsible charge related to this service. The patient expressed understanding and agreed to proceed.   History of Present Illness: [name] is having a tele hospital follow up for a fall in her bathroom not able to identify what made her legs weak and she was on the floor. Presented to the emergency room with right ankle pain. Admitted for non-displaced right ankle fracture , AKI on CKD stage 3 and atrial fibrillation with RVR. And DKA.   Observations/Objective: Review of Systems  Cardiovascular: Positive for palpitations.  Musculoskeletal: Positive for falls and myalgias.  Psychiatric/Behavioral: Positive for depression. The patient is nervous/anxious.    Assessment and Plan: [name] was seen today for atrial fibrillation and diabetic ketoacidosis.  Diagnoses and all orders for this visit:  Hospital discharge follow-up For type 2 diabetes with DKA, status post fall with injury closed right ankle fracture and refer atrial fibrillation with RVR and diastolic dysfunction to cardiology. Discharge recommended blood work. -     CBC with Differential; Future -     CMP14+EGFR; Future   Acute kidney injury superimposed on CKD ([place]) Repeat blood work with GFR .  Type 2 diabetes mellitus without complication, without long-term current use of insulin (HCC) A1C 12.7 increase from 9.8 4 months ago. Not at goal . Requesting to follow up with Clinical pharmacist. Will have her come in today for labs and follow up with [facility] for diabetes.    Follow Up  Instructions:    I discussed the assessment and treatment plan with the patient. The patient was provided an opportunity to ask questions and all were answered. The patient agreed with the plan and demonstrated an understanding of the instructions.   The patient was advised to call back or seek an in-person evaluation if the symptoms worsen or if the condition fails to improve as anticipated.  I provided 15 minutes of non-face-to-face time during this encounter. Review of notes, labs and imaging   [name], NP

## 2019-10-07 NOTE — Progress Notes (Signed)
Pain in right  foot and ankle  Pt states her right ankle is broken

## 2019-10-08 ENCOUNTER — Other Ambulatory Visit (INDEPENDENT_AMBULATORY_CARE_PROVIDER_SITE_OTHER): Payer: Self-pay | Admitting: Primary Care

## 2019-10-08 LAB — CBC WITH DIFFERENTIAL/PLATELET
Basophils Absolute: 0 10*3/uL (ref 0.0–0.2)
Basos: 1 %
EOS (ABSOLUTE): 0.2 10*3/uL (ref 0.0–0.4)
Eos: 2 %
Hematocrit: 40.5 % (ref 34.0–46.6)
Hemoglobin: 13.6 g/dL (ref 11.1–15.9)
Immature Grans (Abs): 0 10*3/uL (ref 0.0–0.1)
Immature Granulocytes: 0 %
Lymphocytes Absolute: 2.6 10*3/uL (ref 0.7–3.1)
Lymphs: 31 %
MCH: 29.9 pg (ref 26.6–33.0)
MCHC: 33.6 g/dL (ref 31.5–35.7)
MCV: 89 fL (ref 79–97)
Monocytes Absolute: 0.9 10*3/uL (ref 0.1–0.9)
Monocytes: 11 %
Neutrophils Absolute: 4.8 10*3/uL (ref 1.4–7.0)
Neutrophils: 55 %
Platelets: 222 10*3/uL (ref 150–450)
RBC: 4.55 x10E6/uL (ref 3.77–5.28)
RDW: 12.1 % (ref 11.7–15.4)
WBC: 8.6 10*3/uL (ref 3.4–10.8)

## 2019-10-08 LAB — CMP14+EGFR
ALT: 10 IU/L (ref 0–32)
AST: 15 IU/L (ref 0–40)
Albumin/Globulin Ratio: 1 — ABNORMAL LOW (ref 1.2–2.2)
Albumin: 3.8 g/dL (ref 3.8–4.8)
Alkaline Phosphatase: 99 IU/L (ref 39–117)
BUN/Creatinine Ratio: 9 — ABNORMAL LOW (ref 12–28)
BUN: 18 mg/dL (ref 8–27)
Bilirubin Total: 0.3 mg/dL (ref 0.0–1.2)
CO2: 31 mmol/L — ABNORMAL HIGH (ref 20–29)
Calcium: 9.1 mg/dL (ref 8.7–10.3)
Chloride: 87 mmol/L — ABNORMAL LOW (ref 96–106)
Creatinine, Ser: 2.09 mg/dL — ABNORMAL HIGH (ref 0.57–1.00)
GFR calc Af Amer: 28 mL/min/{1.73_m2} — ABNORMAL LOW (ref >59)
GFR calc non Af Amer: 25 mL/min/{1.73_m2} — ABNORMAL LOW (ref >59)
Globulin, Total: 3.8 g/dL (ref 1.5–4.5)
Glucose: 400 mg/dL — ABNORMAL HIGH (ref 65–99)
Potassium: 3.1 mmol/L — ABNORMAL LOW (ref 3.5–5.2)
Sodium: 138 mmol/L (ref 134–144)
Total Protein: 7.6 g/dL (ref 6.0–8.5)

## 2019-10-08 MED ORDER — POTASSIUM CHLORIDE CRYS ER 20 MEQ PO TBCR: 20.0000 meq | EXTENDED_RELEASE_TABLET | Freq: Every day | ORAL | 1 refills | Status: DC

## 2019-10-14 ENCOUNTER — Other Ambulatory Visit: Payer: Self-pay

## 2019-10-14 DIAGNOSIS — I5043 Acute on chronic combined systolic (congestive) and diastolic (congestive) heart failure: Secondary | ICD-10-CM | POA: Diagnosis not present

## 2019-10-14 DIAGNOSIS — I130 Hypertensive heart and chronic kidney disease with heart failure and stage 1 through stage 4 chronic kidney disease, or unspecified chronic kidney disease: Secondary | ICD-10-CM | POA: Diagnosis not present

## 2019-10-14 DIAGNOSIS — I482 Chronic atrial fibrillation, unspecified: Secondary | ICD-10-CM | POA: Diagnosis not present

## 2019-10-14 DIAGNOSIS — E1143 Type 2 diabetes mellitus with diabetic autonomic (poly)neuropathy: Secondary | ICD-10-CM | POA: Diagnosis not present

## 2019-10-14 DIAGNOSIS — E1122 Type 2 diabetes mellitus with diabetic chronic kidney disease: Secondary | ICD-10-CM | POA: Diagnosis not present

## 2019-10-14 DIAGNOSIS — M1990 Unspecified osteoarthritis, unspecified site: Secondary | ICD-10-CM | POA: Diagnosis not present

## 2019-10-14 DIAGNOSIS — N183 Chronic kidney disease, stage 3 unspecified: Secondary | ICD-10-CM | POA: Diagnosis not present

## 2019-10-14 DIAGNOSIS — J449 Chronic obstructive pulmonary disease, unspecified: Secondary | ICD-10-CM | POA: Diagnosis not present

## 2019-10-14 DIAGNOSIS — S8254XD Nondisplaced fracture of medial malleolus of right tibia, subsequent encounter for closed fracture with routine healing: Secondary | ICD-10-CM | POA: Diagnosis not present

## 2019-10-14 NOTE — Patient Outreach (Signed)
[facility]) Care Management  10/14/2019  [name] 19-Mar-1956 [id number]   Multiple attempts to establish contact with patient without success. No response from letter mailed to patient.   Plan: RN CM will close case at this time.   Jone Baseman, RN, MSN [facility] Care Management Coordinator Direct Line [phone] Cell [phone] Toll Free: [phone]  Fax: [phone]

## 2019-10-16 DIAGNOSIS — J449 Chronic obstructive pulmonary disease, unspecified: Secondary | ICD-10-CM | POA: Diagnosis not present

## 2019-10-16 DIAGNOSIS — S8254XD Nondisplaced fracture of medial malleolus of right tibia, subsequent encounter for closed fracture with routine healing: Secondary | ICD-10-CM | POA: Diagnosis not present

## 2019-10-16 DIAGNOSIS — M1990 Unspecified osteoarthritis, unspecified site: Secondary | ICD-10-CM | POA: Diagnosis not present

## 2019-10-16 DIAGNOSIS — I482 Chronic atrial fibrillation, unspecified: Secondary | ICD-10-CM | POA: Diagnosis not present

## 2019-10-16 DIAGNOSIS — E1122 Type 2 diabetes mellitus with diabetic chronic kidney disease: Secondary | ICD-10-CM | POA: Diagnosis not present

## 2019-10-16 DIAGNOSIS — I130 Hypertensive heart and chronic kidney disease with heart failure and stage 1 through stage 4 chronic kidney disease, or unspecified chronic kidney disease: Secondary | ICD-10-CM | POA: Diagnosis not present

## 2019-10-16 DIAGNOSIS — E1143 Type 2 diabetes mellitus with diabetic autonomic (poly)neuropathy: Secondary | ICD-10-CM | POA: Diagnosis not present

## 2019-10-16 DIAGNOSIS — N183 Chronic kidney disease, stage 3 unspecified: Secondary | ICD-10-CM | POA: Diagnosis not present

## 2019-10-16 DIAGNOSIS — I5043 Acute on chronic combined systolic (congestive) and diastolic (congestive) heart failure: Secondary | ICD-10-CM | POA: Diagnosis not present

## 2019-10-20 ENCOUNTER — Inpatient Hospital Stay (INDEPENDENT_AMBULATORY_CARE_PROVIDER_SITE_OTHER): Payer: Medicare HMO | Admitting: Primary Care

## 2019-10-21 DIAGNOSIS — E1143 Type 2 diabetes mellitus with diabetic autonomic (poly)neuropathy: Secondary | ICD-10-CM | POA: Diagnosis not present

## 2019-10-21 DIAGNOSIS — S8254XD Nondisplaced fracture of medial malleolus of right tibia, subsequent encounter for closed fracture with routine healing: Secondary | ICD-10-CM | POA: Diagnosis not present

## 2019-10-21 DIAGNOSIS — J449 Chronic obstructive pulmonary disease, unspecified: Secondary | ICD-10-CM | POA: Diagnosis not present

## 2019-10-21 DIAGNOSIS — M1990 Unspecified osteoarthritis, unspecified site: Secondary | ICD-10-CM | POA: Diagnosis not present

## 2019-10-21 DIAGNOSIS — I5043 Acute on chronic combined systolic (congestive) and diastolic (congestive) heart failure: Secondary | ICD-10-CM | POA: Diagnosis not present

## 2019-10-21 DIAGNOSIS — E1122 Type 2 diabetes mellitus with diabetic chronic kidney disease: Secondary | ICD-10-CM | POA: Diagnosis not present

## 2019-10-21 DIAGNOSIS — I130 Hypertensive heart and chronic kidney disease with heart failure and stage 1 through stage 4 chronic kidney disease, or unspecified chronic kidney disease: Secondary | ICD-10-CM | POA: Diagnosis not present

## 2019-10-21 DIAGNOSIS — N183 Chronic kidney disease, stage 3 unspecified: Secondary | ICD-10-CM | POA: Diagnosis not present

## 2019-10-21 DIAGNOSIS — I482 Chronic atrial fibrillation, unspecified: Secondary | ICD-10-CM | POA: Diagnosis not present

## 2019-10-22 ENCOUNTER — Telehealth (INDEPENDENT_AMBULATORY_CARE_PROVIDER_SITE_OTHER): Payer: Self-pay

## 2019-10-22 DIAGNOSIS — S8254XD Nondisplaced fracture of medial malleolus of right tibia, subsequent encounter for closed fracture with routine healing: Secondary | ICD-10-CM | POA: Diagnosis not present

## 2019-10-22 DIAGNOSIS — I130 Hypertensive heart and chronic kidney disease with heart failure and stage 1 through stage 4 chronic kidney disease, or unspecified chronic kidney disease: Secondary | ICD-10-CM | POA: Diagnosis not present

## 2019-10-22 DIAGNOSIS — I482 Chronic atrial fibrillation, unspecified: Secondary | ICD-10-CM | POA: Diagnosis not present

## 2019-10-22 DIAGNOSIS — J449 Chronic obstructive pulmonary disease, unspecified: Secondary | ICD-10-CM | POA: Diagnosis not present

## 2019-10-22 DIAGNOSIS — M1990 Unspecified osteoarthritis, unspecified site: Secondary | ICD-10-CM | POA: Diagnosis not present

## 2019-10-22 DIAGNOSIS — E1122 Type 2 diabetes mellitus with diabetic chronic kidney disease: Secondary | ICD-10-CM | POA: Diagnosis not present

## 2019-10-22 DIAGNOSIS — E1143 Type 2 diabetes mellitus with diabetic autonomic (poly)neuropathy: Secondary | ICD-10-CM | POA: Diagnosis not present

## 2019-10-22 DIAGNOSIS — N183 Chronic kidney disease, stage 3 unspecified: Secondary | ICD-10-CM | POA: Diagnosis not present

## 2019-10-22 DIAGNOSIS — I5043 Acute on chronic combined systolic (congestive) and diastolic (congestive) heart failure: Secondary | ICD-10-CM | POA: Diagnosis not present

## 2019-10-22 NOTE — Telephone Encounter (Signed)
Sent to PCP ?

## 2019-10-22 NOTE — Telephone Encounter (Signed)
Marjory Lies Physical therapist with [facility] called and left a voicemail stating that patient has a ankle fracture and is being seen for physical therapy. Marjory Lies physical therapist states patient has nausea, vomiting and sweating constantly, vitals are stable but erratic. Marjory Lies states patient is alert and coherent times 4. Patient has Afib issues. Marjory Lies does not think patient is stable enough to handle physical therapy. Marjory Lies suggest patient needs to be seen by cardiologist first.  Patient was referred to cardiologist in January but has not been able to contact patient to schedule appointment. Cardiologist has left multiple voice mails.  Please contact Marjory Lies at [phone]  Patient phone number: [phone]

## 2019-10-23 DIAGNOSIS — S8254XD Nondisplaced fracture of medial malleolus of right tibia, subsequent encounter for closed fracture with routine healing: Secondary | ICD-10-CM | POA: Diagnosis not present

## 2019-10-23 DIAGNOSIS — J449 Chronic obstructive pulmonary disease, unspecified: Secondary | ICD-10-CM | POA: Diagnosis not present

## 2019-10-23 DIAGNOSIS — N183 Chronic kidney disease, stage 3 unspecified: Secondary | ICD-10-CM | POA: Diagnosis not present

## 2019-10-23 DIAGNOSIS — I5043 Acute on chronic combined systolic (congestive) and diastolic (congestive) heart failure: Secondary | ICD-10-CM | POA: Diagnosis not present

## 2019-10-23 DIAGNOSIS — M1990 Unspecified osteoarthritis, unspecified site: Secondary | ICD-10-CM | POA: Diagnosis not present

## 2019-10-23 DIAGNOSIS — E1122 Type 2 diabetes mellitus with diabetic chronic kidney disease: Secondary | ICD-10-CM | POA: Diagnosis not present

## 2019-10-23 DIAGNOSIS — E1143 Type 2 diabetes mellitus with diabetic autonomic (poly)neuropathy: Secondary | ICD-10-CM | POA: Diagnosis not present

## 2019-10-23 DIAGNOSIS — I482 Chronic atrial fibrillation, unspecified: Secondary | ICD-10-CM | POA: Diagnosis not present

## 2019-10-23 DIAGNOSIS — I130 Hypertensive heart and chronic kidney disease with heart failure and stage 1 through stage 4 chronic kidney disease, or unspecified chronic kidney disease: Secondary | ICD-10-CM | POA: Diagnosis not present

## 2019-10-30 ENCOUNTER — Telehealth (INDEPENDENT_AMBULATORY_CARE_PROVIDER_SITE_OTHER): Payer: Self-pay

## 2019-10-30 NOTE — Telephone Encounter (Signed)
Patient called stating that her "Afib has kicked last night at 9pm" and is really bad. Patient would like a call back from PCP.  Please advice [phone]

## 2019-10-30 NOTE — Telephone Encounter (Signed)
PCP spoke to patient. PCP informed patient to call cardiologist, Pt. Understood.

## 2019-10-31 ENCOUNTER — Telehealth (INDEPENDENT_AMBULATORY_CARE_PROVIDER_SITE_OTHER): Payer: Self-pay

## 2019-10-31 NOTE — Telephone Encounter (Signed)
Called patient she feels fine now but followed by Dr. Terrence Dupont recently saw him on   . Explained to patient any problems related to her heart please called the cardiologist and follow up with them. Patient verbalized agreement

## 2019-10-31 NOTE — Telephone Encounter (Signed)
Patient is requesting to speak with PCP

## 2019-10-31 NOTE — Telephone Encounter (Signed)
Patient called wanting a call back from PCP. Patient states PCP wanted her to speak with DR. Harwani and to set up an appointment with him. Patient states she has an appointment on Monday but wants to speak with PCP as well.   Please advice [phone]

## 2019-11-03 DIAGNOSIS — I130 Hypertensive heart and chronic kidney disease with heart failure and stage 1 through stage 4 chronic kidney disease, or unspecified chronic kidney disease: Secondary | ICD-10-CM | POA: Diagnosis not present

## 2019-11-03 DIAGNOSIS — M1990 Unspecified osteoarthritis, unspecified site: Secondary | ICD-10-CM | POA: Diagnosis not present

## 2019-11-03 DIAGNOSIS — N183 Chronic kidney disease, stage 3 unspecified: Secondary | ICD-10-CM | POA: Diagnosis not present

## 2019-11-03 DIAGNOSIS — E1122 Type 2 diabetes mellitus with diabetic chronic kidney disease: Secondary | ICD-10-CM | POA: Diagnosis not present

## 2019-11-03 DIAGNOSIS — I5043 Acute on chronic combined systolic (congestive) and diastolic (congestive) heart failure: Secondary | ICD-10-CM | POA: Diagnosis not present

## 2019-11-03 DIAGNOSIS — S8254XD Nondisplaced fracture of medial malleolus of right tibia, subsequent encounter for closed fracture with routine healing: Secondary | ICD-10-CM | POA: Diagnosis not present

## 2019-11-03 DIAGNOSIS — I482 Chronic atrial fibrillation, unspecified: Secondary | ICD-10-CM | POA: Diagnosis not present

## 2019-11-03 DIAGNOSIS — E1143 Type 2 diabetes mellitus with diabetic autonomic (poly)neuropathy: Secondary | ICD-10-CM | POA: Diagnosis not present

## 2019-11-03 DIAGNOSIS — J449 Chronic obstructive pulmonary disease, unspecified: Secondary | ICD-10-CM | POA: Diagnosis not present

## 2019-11-04 DIAGNOSIS — I38 Endocarditis, valve unspecified: Secondary | ICD-10-CM | POA: Diagnosis not present

## 2019-11-04 DIAGNOSIS — I5020 Unspecified systolic (congestive) heart failure: Secondary | ICD-10-CM | POA: Diagnosis not present

## 2019-11-04 DIAGNOSIS — E785 Hyperlipidemia, unspecified: Secondary | ICD-10-CM | POA: Diagnosis not present

## 2019-11-04 DIAGNOSIS — I482 Chronic atrial fibrillation, unspecified: Secondary | ICD-10-CM | POA: Diagnosis not present

## 2019-11-04 DIAGNOSIS — I890 Lymphedema, not elsewhere classified: Secondary | ICD-10-CM | POA: Diagnosis not present

## 2019-11-04 DIAGNOSIS — R0789 Other chest pain: Secondary | ICD-10-CM | POA: Diagnosis not present

## 2019-11-04 DIAGNOSIS — I10 Essential (primary) hypertension: Secondary | ICD-10-CM | POA: Diagnosis not present

## 2019-11-04 DIAGNOSIS — N189 Chronic kidney disease, unspecified: Secondary | ICD-10-CM | POA: Diagnosis not present

## 2019-11-04 DIAGNOSIS — E119 Type 2 diabetes mellitus without complications: Secondary | ICD-10-CM | POA: Diagnosis not present

## 2019-11-04 NOTE — Telephone Encounter (Signed)
Called patient she was not feeling well complains of Afib acting up decided she could not wait for scheduled appointment and called Dr. Terrence Dupont office she is schedule to see him today 11/04/2019

## 2019-11-06 DIAGNOSIS — M1990 Unspecified osteoarthritis, unspecified site: Secondary | ICD-10-CM | POA: Diagnosis not present

## 2019-11-06 DIAGNOSIS — I482 Chronic atrial fibrillation, unspecified: Secondary | ICD-10-CM | POA: Diagnosis not present

## 2019-11-06 DIAGNOSIS — I130 Hypertensive heart and chronic kidney disease with heart failure and stage 1 through stage 4 chronic kidney disease, or unspecified chronic kidney disease: Secondary | ICD-10-CM | POA: Diagnosis not present

## 2019-11-06 DIAGNOSIS — E1122 Type 2 diabetes mellitus with diabetic chronic kidney disease: Secondary | ICD-10-CM | POA: Diagnosis not present

## 2019-11-06 DIAGNOSIS — S8254XD Nondisplaced fracture of medial malleolus of right tibia, subsequent encounter for closed fracture with routine healing: Secondary | ICD-10-CM | POA: Diagnosis not present

## 2019-11-06 DIAGNOSIS — I5043 Acute on chronic combined systolic (congestive) and diastolic (congestive) heart failure: Secondary | ICD-10-CM | POA: Diagnosis not present

## 2019-11-06 DIAGNOSIS — N183 Chronic kidney disease, stage 3 unspecified: Secondary | ICD-10-CM | POA: Diagnosis not present

## 2019-11-06 DIAGNOSIS — J449 Chronic obstructive pulmonary disease, unspecified: Secondary | ICD-10-CM | POA: Diagnosis not present

## 2019-11-06 DIAGNOSIS — E1143 Type 2 diabetes mellitus with diabetic autonomic (poly)neuropathy: Secondary | ICD-10-CM | POA: Diagnosis not present

## 2019-11-10 DIAGNOSIS — E1122 Type 2 diabetes mellitus with diabetic chronic kidney disease: Secondary | ICD-10-CM | POA: Diagnosis not present

## 2019-11-10 DIAGNOSIS — E1143 Type 2 diabetes mellitus with diabetic autonomic (poly)neuropathy: Secondary | ICD-10-CM | POA: Diagnosis not present

## 2019-11-10 DIAGNOSIS — N183 Chronic kidney disease, stage 3 unspecified: Secondary | ICD-10-CM | POA: Diagnosis not present

## 2019-11-10 DIAGNOSIS — J449 Chronic obstructive pulmonary disease, unspecified: Secondary | ICD-10-CM | POA: Diagnosis not present

## 2019-11-10 DIAGNOSIS — M1990 Unspecified osteoarthritis, unspecified site: Secondary | ICD-10-CM | POA: Diagnosis not present

## 2019-11-10 DIAGNOSIS — I482 Chronic atrial fibrillation, unspecified: Secondary | ICD-10-CM | POA: Diagnosis not present

## 2019-11-10 DIAGNOSIS — I130 Hypertensive heart and chronic kidney disease with heart failure and stage 1 through stage 4 chronic kidney disease, or unspecified chronic kidney disease: Secondary | ICD-10-CM | POA: Diagnosis not present

## 2019-11-10 DIAGNOSIS — I5043 Acute on chronic combined systolic (congestive) and diastolic (congestive) heart failure: Secondary | ICD-10-CM | POA: Diagnosis not present

## 2019-11-10 DIAGNOSIS — S8254XD Nondisplaced fracture of medial malleolus of right tibia, subsequent encounter for closed fracture with routine healing: Secondary | ICD-10-CM | POA: Diagnosis not present

## 2019-11-17 DIAGNOSIS — I5043 Acute on chronic combined systolic (congestive) and diastolic (congestive) heart failure: Secondary | ICD-10-CM | POA: Diagnosis not present

## 2019-11-17 DIAGNOSIS — I130 Hypertensive heart and chronic kidney disease with heart failure and stage 1 through stage 4 chronic kidney disease, or unspecified chronic kidney disease: Secondary | ICD-10-CM | POA: Diagnosis not present

## 2019-11-17 DIAGNOSIS — J449 Chronic obstructive pulmonary disease, unspecified: Secondary | ICD-10-CM | POA: Diagnosis not present

## 2019-11-17 DIAGNOSIS — N183 Chronic kidney disease, stage 3 unspecified: Secondary | ICD-10-CM | POA: Diagnosis not present

## 2019-11-17 DIAGNOSIS — S8251XD Displaced fracture of medial malleolus of right tibia, subsequent encounter for closed fracture with routine healing: Secondary | ICD-10-CM | POA: Diagnosis not present

## 2019-11-17 DIAGNOSIS — E1122 Type 2 diabetes mellitus with diabetic chronic kidney disease: Secondary | ICD-10-CM | POA: Diagnosis not present

## 2019-11-17 DIAGNOSIS — M1990 Unspecified osteoarthritis, unspecified site: Secondary | ICD-10-CM | POA: Diagnosis not present

## 2019-11-17 DIAGNOSIS — S8254XD Nondisplaced fracture of medial malleolus of right tibia, subsequent encounter for closed fracture with routine healing: Secondary | ICD-10-CM | POA: Diagnosis not present

## 2019-11-17 DIAGNOSIS — E1143 Type 2 diabetes mellitus with diabetic autonomic (poly)neuropathy: Secondary | ICD-10-CM | POA: Diagnosis not present

## 2019-11-17 DIAGNOSIS — I482 Chronic atrial fibrillation, unspecified: Secondary | ICD-10-CM | POA: Diagnosis not present

## 2019-11-20 DIAGNOSIS — S82891A Other fracture of right lower leg, initial encounter for closed fracture: Secondary | ICD-10-CM | POA: Diagnosis not present

## 2019-11-24 DIAGNOSIS — J449 Chronic obstructive pulmonary disease, unspecified: Secondary | ICD-10-CM | POA: Diagnosis not present

## 2019-11-24 DIAGNOSIS — I5043 Acute on chronic combined systolic (congestive) and diastolic (congestive) heart failure: Secondary | ICD-10-CM | POA: Diagnosis not present

## 2019-11-24 DIAGNOSIS — M1990 Unspecified osteoarthritis, unspecified site: Secondary | ICD-10-CM | POA: Diagnosis not present

## 2019-11-24 DIAGNOSIS — S8254XD Nondisplaced fracture of medial malleolus of right tibia, subsequent encounter for closed fracture with routine healing: Secondary | ICD-10-CM | POA: Diagnosis not present

## 2019-11-24 DIAGNOSIS — E1122 Type 2 diabetes mellitus with diabetic chronic kidney disease: Secondary | ICD-10-CM | POA: Diagnosis not present

## 2019-11-24 DIAGNOSIS — I482 Chronic atrial fibrillation, unspecified: Secondary | ICD-10-CM | POA: Diagnosis not present

## 2019-11-24 DIAGNOSIS — N183 Chronic kidney disease, stage 3 unspecified: Secondary | ICD-10-CM | POA: Diagnosis not present

## 2019-11-24 DIAGNOSIS — I130 Hypertensive heart and chronic kidney disease with heart failure and stage 1 through stage 4 chronic kidney disease, or unspecified chronic kidney disease: Secondary | ICD-10-CM | POA: Diagnosis not present

## 2019-11-24 DIAGNOSIS — E1143 Type 2 diabetes mellitus with diabetic autonomic (poly)neuropathy: Secondary | ICD-10-CM | POA: Diagnosis not present

## 2019-12-01 DIAGNOSIS — N183 Chronic kidney disease, stage 3 unspecified: Secondary | ICD-10-CM | POA: Diagnosis not present

## 2019-12-01 DIAGNOSIS — J449 Chronic obstructive pulmonary disease, unspecified: Secondary | ICD-10-CM | POA: Diagnosis not present

## 2019-12-01 DIAGNOSIS — E1122 Type 2 diabetes mellitus with diabetic chronic kidney disease: Secondary | ICD-10-CM | POA: Diagnosis not present

## 2019-12-01 DIAGNOSIS — M1990 Unspecified osteoarthritis, unspecified site: Secondary | ICD-10-CM | POA: Diagnosis not present

## 2019-12-01 DIAGNOSIS — E1143 Type 2 diabetes mellitus with diabetic autonomic (poly)neuropathy: Secondary | ICD-10-CM | POA: Diagnosis not present

## 2019-12-01 DIAGNOSIS — I5043 Acute on chronic combined systolic (congestive) and diastolic (congestive) heart failure: Secondary | ICD-10-CM | POA: Diagnosis not present

## 2019-12-01 DIAGNOSIS — I130 Hypertensive heart and chronic kidney disease with heart failure and stage 1 through stage 4 chronic kidney disease, or unspecified chronic kidney disease: Secondary | ICD-10-CM | POA: Diagnosis not present

## 2019-12-01 DIAGNOSIS — S8254XD Nondisplaced fracture of medial malleolus of right tibia, subsequent encounter for closed fracture with routine healing: Secondary | ICD-10-CM | POA: Diagnosis not present

## 2019-12-01 DIAGNOSIS — I482 Chronic atrial fibrillation, unspecified: Secondary | ICD-10-CM | POA: Diagnosis not present

## 2019-12-08 DIAGNOSIS — I38 Endocarditis, valve unspecified: Secondary | ICD-10-CM | POA: Diagnosis not present

## 2019-12-08 DIAGNOSIS — I10 Essential (primary) hypertension: Secondary | ICD-10-CM | POA: Diagnosis not present

## 2019-12-08 DIAGNOSIS — R0789 Other chest pain: Secondary | ICD-10-CM | POA: Diagnosis not present

## 2019-12-08 DIAGNOSIS — E119 Type 2 diabetes mellitus without complications: Secondary | ICD-10-CM | POA: Diagnosis not present

## 2019-12-08 DIAGNOSIS — I890 Lymphedema, not elsewhere classified: Secondary | ICD-10-CM | POA: Diagnosis not present

## 2019-12-08 DIAGNOSIS — I482 Chronic atrial fibrillation, unspecified: Secondary | ICD-10-CM | POA: Diagnosis not present

## 2019-12-08 DIAGNOSIS — E785 Hyperlipidemia, unspecified: Secondary | ICD-10-CM | POA: Diagnosis not present

## 2019-12-08 DIAGNOSIS — I5020 Unspecified systolic (congestive) heart failure: Secondary | ICD-10-CM | POA: Diagnosis not present

## 2019-12-08 DIAGNOSIS — N189 Chronic kidney disease, unspecified: Secondary | ICD-10-CM | POA: Diagnosis not present

## 2019-12-10 DIAGNOSIS — G4733 Obstructive sleep apnea (adult) (pediatric): Secondary | ICD-10-CM | POA: Diagnosis not present

## 2019-12-18 ENCOUNTER — Telehealth (INDEPENDENT_AMBULATORY_CARE_PROVIDER_SITE_OTHER): Payer: Self-pay

## 2019-12-18 NOTE — Telephone Encounter (Signed)
Please have patient schedule appointment to evaluate pain.

## 2019-12-18 NOTE — Telephone Encounter (Signed)
Patient called stating she is having pain on her legs and feet as well on her hands. Patient would like a call back from PCP or will like to know if PCP can send a RX for pain medication.  Patient uses [facility] on [address]   Please advice [phone]

## 2019-12-18 NOTE — Telephone Encounter (Signed)
Patient made an appointment on May 4th @ 10:10 am.

## 2019-12-23 ENCOUNTER — Encounter (INDEPENDENT_AMBULATORY_CARE_PROVIDER_SITE_OTHER): Payer: Self-pay | Admitting: Family Medicine

## 2019-12-23 ENCOUNTER — Ambulatory Visit (INDEPENDENT_AMBULATORY_CARE_PROVIDER_SITE_OTHER): Payer: Medicare HMO | Admitting: Family Medicine

## 2019-12-23 ENCOUNTER — Other Ambulatory Visit: Payer: Self-pay

## 2019-12-23 VITALS — BP 121/84 | HR 100 | Temp 97.3°F | Ht 66.0 in | Wt 284.4 lb

## 2019-12-23 DIAGNOSIS — G629 Polyneuropathy, unspecified: Secondary | ICD-10-CM

## 2019-12-23 DIAGNOSIS — M25561 Pain in right knee: Secondary | ICD-10-CM | POA: Diagnosis not present

## 2019-12-23 DIAGNOSIS — E119 Type 2 diabetes mellitus without complications: Secondary | ICD-10-CM

## 2019-12-23 DIAGNOSIS — M25562 Pain in left knee: Secondary | ICD-10-CM

## 2019-12-23 DIAGNOSIS — I482 Chronic atrial fibrillation, unspecified: Secondary | ICD-10-CM | POA: Diagnosis not present

## 2019-12-23 DIAGNOSIS — N1832 Chronic kidney disease, stage 3b: Secondary | ICD-10-CM | POA: Diagnosis not present

## 2019-12-23 DIAGNOSIS — I10 Essential (primary) hypertension: Secondary | ICD-10-CM | POA: Diagnosis not present

## 2019-12-23 LAB — POCT GLYCOSYLATED HEMOGLOBIN (HGB A1C): Hemoglobin A1C: 10 % — AB (ref 4.0–5.6)

## 2019-12-23 MED ORDER — TRULICITY 1.5 MG/0.5ML ~~LOC~~ SOAJ: 1.5000 mg | SUBCUTANEOUS | 2 refills | Status: DC

## 2019-12-23 MED ORDER — TIZANIDINE HCL 4 MG PO TABS: 4.0000 mg | ORAL_TABLET | Freq: Four times a day (QID) | ORAL | 1 refills | Status: DC | PRN

## 2019-12-23 NOTE — Progress Notes (Signed)
Established Patient Office Visit  Subjective:  Patient ID: [name], female    DOB: 06-27-1956  Age: 64 y.o. MRN: [id number]  CC:  Chief Complaint  Patient presents with  . Diabetes    HPI [name] presents for evaluation of diabetes, polyneuropathy, and knee pain.  Diabetes, last A1C uncontrolled 12.7.  Patient was hospitalized in February for DKA.  Current regimen for diabetes include 5 units of rapid acting  insulin, 50 units of Levemir twice daily and Trulicity  8.17 once weekly. Reports fasting readings have been as low as 125-200's. Fasting glucose today 170.  She complains of severe neuropathic pain in her fingers and lower extremities.  She endorses compliance with taking medication.  She attributes the uncontrolled most of her diabetes to recent Covid pandemic and being unable to get out of the house.  Patient reports receipt of 2/2 COVID-19 vaccines.  Knee pain,Patient reports persistent bilateral knee pain which radiates down the front of her legs. Endorses knee swelling. Unable to take NSAIDS due to CKD. No prior orthopedic evaluation or known history of arthritis. Past Medical History:  Diagnosis Date  . Allergic rhinitis   . Arthritis   . Asthma   . Chest pain 12/27/2017  . Chronic diastolic CHF (congestive heart failure) ([place])   . COPD (chronic obstructive pulmonary disease) ([place])   . Depression   . DM (diabetes mellitus) ([place])   . DVT (deep vein thrombosis) in pregnancy   . Dysrhythmia    atrial fibrilation  . GERD (gastroesophageal reflux disease)   . Headache(784.0)   . HTN (hypertension)   . Hyperlipidemia   . Obesity   . Pneumonia 04/2018   RIGHT LOBE  . Shortness of breath   . Sleep apnea    compliant with CPAP    Past Surgical History:  Procedure Laterality Date  . ABDOMINAL HYSTERECTOMY    . CATARACT EXTRACTION, BILATERAL    . CHOLECYSTECTOMY    . COLONOSCOPY WITH PROPOFOL N/A 03/05/2015   Procedure: COLONOSCOPY WITH  PROPOFOL;  Surgeon: Carol Ada, MD;  Location: [facility];  Service: Endoscopy;  Laterality: N/A;  . DIAGNOSTIC LAPAROSCOPY    . ingrown hallux Left   . KNEE SURGERY    . LEFT HEART CATH AND CORONARY ANGIOGRAPHY N/A 12/31/2017   Procedure: LEFT HEART CATH AND CORONARY ANGIOGRAPHY;  Surgeon: Charolette Forward, MD;  Location: [facility];  Service: Cardiovascular;  Laterality: N/A;    Family History  Problem Relation Age of Onset  . Cancer Mother   . Hypertension Mother   . Cancer Father   . CAD Other   . Hypertension Sister   . Hypertension Brother     Social History   Socioeconomic History  . Marital status: Widowed    Spouse name: Not on file  . Number of children: Not on file  . Years of education: Not on file  . Highest education level: Not on file  Occupational History  . Not on file  Tobacco Use  . Smoking status: Never Smoker  . Smokeless tobacco: Never Used  Substance and Sexual Activity  . Alcohol use: No  . Drug use: No  . Sexual activity: Not on file  Other Topics Concern  . Not on file  Social History Narrative   Pt lives in [place] with spouse.  2 grown children.   Previously worked in [employer] at [employer].  Now on disability   Social Determinants of Health   Financial Resource Strain:   .  Difficulty of Paying Living Expenses:   Food Insecurity:   . Worried About Charity fundraiser in the Last Year:   . Arboriculturist in the Last Year:   Transportation Needs:   . Film/video editor (Medical):   Marland Kitchen Lack of Transportation (Non-Medical):   Physical Activity:   . Days of Exercise per Week:   . Minutes of Exercise per Session:   Stress:   . Feeling of Stress :   Social Connections:   . Frequency of Communication with Friends and Family:   . Frequency of Social Gatherings with Friends and Family:   . Attends Religious Services:   . Active Member of Clubs or Organizations:   . Attends Archivist Meetings:   Marland Kitchen Marital Status:    Intimate Partner Violence:   . Fear of Current or Ex-Partner:   . Emotionally Abused:   Marland Kitchen Physically Abused:   . Sexually Abused:     Outpatient Medications Prior to Visit  Medication Sig Dispense Refill  . Accu-Chek FastClix Lancets MISC Use as directed three times daily 102 each 12  . albuterol (VENTOLIN HFA) 108 (90 Base) MCG/ACT inhaler Inhale 2 puffs into the lungs every 4 (four) hours as needed for wheezing or shortness of breath.     . Alcohol Swabs (B-D SINGLE USE SWABS REGULAR) PADS     . Blood Glucose Monitoring Suppl (TRUE METRIX METER) w/Device KIT Use to check blood sugar daily. 1 kit 0  . dabigatran (PRADAXA) 150 MG CAPS capsule Take 1 capsule (150 mg total) by mouth 2 (two) times daily. 60 capsule 2  . diltiazem (CARDIZEM) 60 MG tablet Take 1 tablet (60 mg total) by mouth every 8 (eight) hours. 90 tablet 3  . DULoxetine (CYMBALTA) 60 MG capsule Take 1 capsule (60 mg total) by mouth daily. 30 capsule 3  . furosemide (LASIX) 40 MG tablet Take 1 tablet (40 mg total) by mouth daily. (Patient taking differently: Take 40 mg by mouth 2 (two) times daily. ) 60 tablet 11  . gabapentin (NEURONTIN) 300 MG capsule Take 600 mg by mouth 2 (two) times daily.     Marland Kitchen glucose blood (TRUE METRIX BLOOD GLUCOSE TEST) test strip Use as instructed to check blood sugar daily. 100 each 11  . Insulin Syringe-Needle U-100 (TRUEPLUS INSULIN SYRINGE) 31G X 5/16" 0.3 ML MISC Use to administer insulin every day. 100 each 11  . LEVEMIR 100 UNIT/ML injection INJECT 0.4 MLS (40 UNITS TOTAL) INTO THE SKIN 2 (TWO) TIMES DAILY. (Patient taking differently: Inject 50 Units into the skin 2 (two) times daily. ) 60 mL 1  . Melatonin 5 MG CAPS Take 1 capsule (5 mg total) by mouth at bedtime as needed. (Patient taking differently: Take 5 mg by mouth at bedtime as needed (sleep). ) 30 capsule 3  . metolazone (ZAROXOLYN) 5 MG tablet Take 5 mg by mouth daily.    . metoprolol tartrate (LOPRESSOR) 25 MG tablet Take 1 tablet  (25 mg total) by mouth 2 (two) times daily. 60 tablet 0  . Multiple Vitamins-Minerals (MULTIVITAMIN WOMEN PO) Take 1 tablet by mouth daily.    . nitroGLYCERIN (NITROSTAT) 0.4 MG SL tablet Place 1 tablet (0.4 mg total) under the tongue every 5 (five) minutes x 3 doses as needed for chest pain. 25 tablet 12  . NOVOLOG 100 UNIT/ML injection INJECT 8 UNITS INTO THE SKIN 3 (THREE) TIMES DAILY WITH MEALS. TAKE ONLY WHEN YOUR EAT MORE THAN 50% OF  YOUR MEALS (Patient taking differently: Inject 5 Units into the skin 3 (three) times daily with meals. ) 30 mL 0  . oxyCODONE (ROXICODONE) 5 MG immediate release tablet Take 1 tablet (5 mg total) by mouth every 8 (eight) hours as needed. 20 tablet 0  . pantoprazole (PROTONIX) 40 MG tablet Take 1 tablet (40 mg total) by mouth 2 (two) times daily. 60 tablet 0  . potassium chloride SA (KLOR-CON) 20 MEQ tablet Take 1 tablet (20 mEq total) by mouth daily. 90 tablet 1  . RESTASIS 0.05 % ophthalmic emulsion Place 1 drop into both eyes 2 (two) times daily.  12  . TRULICITY 6.01 UX/3.2TF SOPN INJECT 0.75 MG INTO THE SKIN ONCE A WEEK. (Patient taking differently: Inject 0.75 mg into the skin once a week. ) 2 mL 2  . HYDROcodone-acetaminophen (NORCO/VICODIN) 5-325 MG tablet Take 1 tablet by mouth every 6 (six) hours as needed.     No facility-administered medications prior to visit.    Allergies  Allergen Reactions  . Sulfa Antibiotics Itching    ROS Review of Systems Pertinent negatives listed in HPI   Objective:    Physical Exam  Constitutional: She is oriented to person, place, and time. She appears well-developed.  Cardiovascular: Regular rhythm. Tachycardia present.  Pulmonary/Chest: Effort normal and breath sounds normal.  Musculoskeletal:     Right knee: Bony tenderness present. Decreased range of motion. Tenderness present over the medial joint line.     Left knee: Bony tenderness present. Decreased range of motion. Tenderness present over the medial  joint line.  Neurological: She is alert and oriented to person, place, and time.  Psychiatric: She has a normal mood and affect. Her speech is normal and behavior is normal.   BP 121/84 (BP Location: Left Arm, Patient Position: Sitting, Cuff Size: Large)   Pulse 100   Temp (!) 97.3 F (36.3 C) (Temporal)   Ht '5\' 6"'$  (1.676 m)   Wt 284 lb 6.4 oz (129 kg)   SpO2 96%   BMI 45.90 kg/m  Wt Readings from Last 3 Encounters:  12/23/19 284 lb 6.4 oz (129 kg)  09/26/19 265 lb 10.5 oz (120.5 kg)  09/04/19 269 lb (122 kg)        Health Maintenance Due  Topic Date Due  . OPHTHALMOLOGY EXAM  Never done  . COVID-19 Vaccine (1) Never done  . PAP SMEAR-Modifier  Never done  . MAMMOGRAM  Never done    There are no preventive care reminders to display for this patient.  Lab Results  Component Value Date   TSH 3.110 09/04/2019   Lab Results  Component Value Date   WBC 8.6 10/07/2019   HGB 13.6 10/07/2019   HCT 40.5 10/07/2019   MCV 89 10/07/2019   PLT 222 10/07/2019   Lab Results  Component Value Date   NA 138 10/07/2019   K 3.1 (L) 10/07/2019   CO2 31 (H) 10/07/2019   GLUCOSE 400 (H) 10/07/2019   BUN 18 10/07/2019   CREATININE 2.09 (H) 10/07/2019   BILITOT 0.3 10/07/2019   ALKPHOS 99 10/07/2019   AST 15 10/07/2019   ALT 10 10/07/2019   PROT 7.6 10/07/2019   ALBUMIN 3.8 10/07/2019   CALCIUM 9.1 10/07/2019   ANIONGAP 10 09/26/2019   Lab Results  Component Value Date   CHOL 234 (H) 09/04/2019   Lab Results  Component Value Date   HDL 47 09/04/2019   Lab Results  Component Value Date   LDLCALC  161 (H) 09/04/2019   Lab Results  Component Value Date   TRIG 143 09/04/2019   Lab Results  Component Value Date   CHOLHDL 5.0 (H) 09/04/2019   Lab Results  Component Value Date   HGBA1C 10.0 (A) 12/23/2019      Assessment & Plan:   Problem List Items Addressed This Visit      Endocrine   Diabetes mellitus ([place]) - Primary   Relevant Orders   HgB A1c  (Completed) 10.0 today Increase Trulicity 7.53 once weekly as dose  Continue Levemir and  Rapid acting insulin  If glucose remains consistently greater than 200 notify us here at the office. Follow-up for diabetes management in 2 months if A1c remains outside of parameters titrate Trulicity to the next dose.    Other Visit Diagnoses    Acute pain of both knees       Relevant Orders   Ambulatory referral to Orthopedic Surgery Tizanidine 4 mg every 6 hours as needed for pain   Stage 3b chronic kidney disease       Relevant Orders   Comprehensive metabolic panel   Essential hypertension, controlled today    Relevant Orders   Continue current regimen CMP pending  Polyneuropathy   Relevant Orders  Continue Cymbalta and gabapentin.  Added tizanidine 4 mg every 6 hours as needed for pain     Chronic atrial fibrillation (managed by cardiologist Dr. Terrence Dupont)  Relevant Orders  TSH panel pending  Continue current regimen and follow-up with cardiology    Meds ordered this encounter  Medications  . tiZANidine (ZANAFLEX) 4 MG tablet    Sig: Take 1 tablet (4 mg total) by mouth every 6 (six) hours as needed for muscle spasms.    Dispense:  90 tablet    Refill:  1  . Dulaglutide (TRULICITY) 1.5 DF/1.7HE SOPN    Sig: Inject 1.5 mg into the skin once a week.    Dispense:  12 pen    Refill:  2    Follow-up: Return in about 2 months (around 02/22/2020).    Molli Barrows, FNP

## 2019-12-24 DIAGNOSIS — S82891A Other fracture of right lower leg, initial encounter for closed fracture: Secondary | ICD-10-CM | POA: Diagnosis not present

## 2019-12-24 LAB — COMPREHENSIVE METABOLIC PANEL
ALT: 8 IU/L (ref 0–32)
AST: 16 IU/L (ref 0–40)
Albumin/Globulin Ratio: 1 — ABNORMAL LOW (ref 1.2–2.2)
Albumin: 3.9 g/dL (ref 3.8–4.8)
Alkaline Phosphatase: 100 IU/L (ref 39–117)
BUN/Creatinine Ratio: 13 (ref 12–28)
BUN: 24 mg/dL (ref 8–27)
Bilirubin Total: 0.4 mg/dL (ref 0.0–1.2)
CO2: 27 mmol/L (ref 20–29)
Calcium: 9.3 mg/dL (ref 8.7–10.3)
Chloride: 96 mmol/L (ref 96–106)
Creatinine, Ser: 1.86 mg/dL — ABNORMAL HIGH (ref 0.57–1.00)
GFR calc Af Amer: 33 mL/min/{1.73_m2} — ABNORMAL LOW (ref >59)
GFR calc non Af Amer: 28 mL/min/{1.73_m2} — ABNORMAL LOW (ref >59)
Globulin, Total: 3.9 g/dL (ref 1.5–4.5)
Glucose: 249 mg/dL — ABNORMAL HIGH (ref 65–99)
Potassium: 3.4 mmol/L — ABNORMAL LOW (ref 3.5–5.2)
Sodium: 142 mmol/L (ref 134–144)
Total Protein: 7.8 g/dL (ref 6.0–8.5)

## 2019-12-24 LAB — THYROID PANEL WITH TSH
Free Thyroxine Index: 2.1 (ref 1.2–4.9)
T3 Uptake Ratio: 28 % (ref 24–39)
T4, Total: 7.5 ug/dL (ref 4.5–12.0)
TSH: 2.82 u[IU]/mL (ref 0.450–4.500)

## 2019-12-25 ENCOUNTER — Telehealth (INDEPENDENT_AMBULATORY_CARE_PROVIDER_SITE_OTHER): Payer: Self-pay | Admitting: Family Medicine

## 2019-12-25 MED ORDER — POTASSIUM CHLORIDE ER 10 MEQ PO CPCR: 10.0000 meq | ORAL_CAPSULE | Freq: Every day | ORAL | 1 refills | Status: DC

## 2019-12-25 NOTE — Telephone Encounter (Signed)
Patient is aware of increase in trulicity and new dose being sent to pharmacy. Patient aware of lab results and need for low dose potassium. She is aware that Rx has been sent to pharmacy as well. She verbalized understanding.

## 2019-12-25 NOTE — Telephone Encounter (Signed)
Please contact patient to see if she picked up the increased dose of Trulicity that was sent to the pharmacy. Patient has not reviewed my Mychart message sent on Tuesday.   Also I have review her labs, notify patient renal function remains impaired although stable. Her potassium is little low due to Lasix and has been low previously. I am adding a low dose of potassium 10 MEQ to take once daily with furosemide. Keep 2 month follow-up with [name].  Thanks,  Molli Barrows, FNP -C

## 2020-01-13 ENCOUNTER — Ambulatory Visit: Payer: Medicare HMO | Admitting: Orthopaedic Surgery

## 2020-01-15 ENCOUNTER — Telehealth (INDEPENDENT_AMBULATORY_CARE_PROVIDER_SITE_OTHER): Payer: Self-pay

## 2020-01-15 ENCOUNTER — Emergency Department (HOSPITAL_COMMUNITY)
Admission: EM | Admit: 2020-01-15 | Discharge: 2020-01-15 | Disposition: A | Discharge status: Home / Self Care | Payer: Medicare HMO | Attending: Emergency Medicine | Admitting: Emergency Medicine

## 2020-01-15 ENCOUNTER — Emergency Department (HOSPITAL_COMMUNITY): Payer: Medicare HMO

## 2020-01-15 ENCOUNTER — Encounter (HOSPITAL_COMMUNITY): Payer: Self-pay | Admitting: Student

## 2020-01-15 ENCOUNTER — Other Ambulatory Visit: Payer: Self-pay

## 2020-01-15 DIAGNOSIS — R231 Pallor: Secondary | ICD-10-CM | POA: Diagnosis not present

## 2020-01-15 DIAGNOSIS — J449 Chronic obstructive pulmonary disease, unspecified: Secondary | ICD-10-CM | POA: Insufficient documentation

## 2020-01-15 DIAGNOSIS — E876 Hypokalemia: Secondary | ICD-10-CM | POA: Diagnosis not present

## 2020-01-15 DIAGNOSIS — R42 Dizziness and giddiness: Secondary | ICD-10-CM | POA: Diagnosis not present

## 2020-01-15 DIAGNOSIS — R079 Chest pain, unspecified: Secondary | ICD-10-CM | POA: Diagnosis not present

## 2020-01-15 DIAGNOSIS — R61 Generalized hyperhidrosis: Secondary | ICD-10-CM | POA: Insufficient documentation

## 2020-01-15 DIAGNOSIS — Z86718 Personal history of other venous thrombosis and embolism: Secondary | ICD-10-CM | POA: Insufficient documentation

## 2020-01-15 DIAGNOSIS — Z20822 Contact with and (suspected) exposure to covid-19: Secondary | ICD-10-CM | POA: Diagnosis not present

## 2020-01-15 DIAGNOSIS — R0602 Shortness of breath: Secondary | ICD-10-CM | POA: Diagnosis not present

## 2020-01-15 DIAGNOSIS — R110 Nausea: Secondary | ICD-10-CM | POA: Insufficient documentation

## 2020-01-15 DIAGNOSIS — I4891 Unspecified atrial fibrillation: Secondary | ICD-10-CM | POA: Diagnosis not present

## 2020-01-15 DIAGNOSIS — R0789 Other chest pain: Secondary | ICD-10-CM | POA: Diagnosis not present

## 2020-01-15 DIAGNOSIS — Z79899 Other long term (current) drug therapy: Secondary | ICD-10-CM | POA: Diagnosis not present

## 2020-01-15 DIAGNOSIS — I110 Hypertensive heart disease with heart failure: Secondary | ICD-10-CM | POA: Insufficient documentation

## 2020-01-15 DIAGNOSIS — R519 Headache, unspecified: Secondary | ICD-10-CM | POA: Diagnosis not present

## 2020-01-15 DIAGNOSIS — I5032 Chronic diastolic (congestive) heart failure: Secondary | ICD-10-CM | POA: Diagnosis not present

## 2020-01-15 LAB — CBC WITH DIFFERENTIAL/PLATELET
Abs Immature Granulocytes: 0.04 10*3/uL (ref 0.00–0.07)
Basophils Absolute: 0 10*3/uL (ref 0.0–0.1)
Basophils Relative: 0 %
Eosinophils Absolute: 0.1 10*3/uL (ref 0.0–0.5)
Eosinophils Relative: 2 %
HCT: 36.4 % (ref 36.0–46.0)
Hemoglobin: 12 g/dL (ref 12.0–15.0)
Immature Granulocytes: 1 %
Lymphocytes Relative: 26 %
Lymphs Abs: 2.1 10*3/uL (ref 0.7–4.0)
MCH: 29.6 pg (ref 26.0–34.0)
MCHC: 33 g/dL (ref 30.0–36.0)
MCV: 89.9 fL (ref 80.0–100.0)
Monocytes Absolute: 0.7 10*3/uL (ref 0.1–1.0)
Monocytes Relative: 9 %
Neutro Abs: 5.1 10*3/uL (ref 1.7–7.7)
Neutrophils Relative %: 62 %
Platelets: 202 10*3/uL (ref 150–400)
RBC: 4.05 MIL/uL (ref 3.87–5.11)
RDW: 13.1 % (ref 11.5–15.5)
WBC: 8.1 10*3/uL (ref 4.0–10.5)
nRBC: 0 % (ref 0.0–0.2)

## 2020-01-15 LAB — MAGNESIUM: Magnesium: 1.2 mg/dL — ABNORMAL LOW (ref 1.7–2.4)

## 2020-01-15 LAB — BASIC METABOLIC PANEL
Anion gap: 14 (ref 5–15)
BUN: 18 mg/dL (ref 8–23)
CO2: 31 mmol/L (ref 22–32)
Calcium: 8.7 mg/dL — ABNORMAL LOW (ref 8.9–10.3)
Chloride: 93 mmol/L — ABNORMAL LOW (ref 98–111)
Creatinine, Ser: 1.9 mg/dL — ABNORMAL HIGH (ref 0.44–1.00)
GFR calc Af Amer: 32 mL/min — ABNORMAL LOW (ref >60)
GFR calc non Af Amer: 28 mL/min — ABNORMAL LOW (ref >60)
Glucose, Bld: 358 mg/dL — ABNORMAL HIGH (ref 70–99)
Potassium: 3.1 mmol/L — ABNORMAL LOW (ref 3.5–5.1)
Sodium: 138 mmol/L (ref 135–145)

## 2020-01-15 LAB — SARS CORONAVIRUS 2 BY RT PCR (HOSPITAL ORDER, PERFORMED IN ~~LOC~~ HOSPITAL LAB): SARS Coronavirus 2: NEGATIVE

## 2020-01-15 LAB — PROTIME-INR
INR: 1.8 — ABNORMAL HIGH (ref 0.8–1.2)
Prothrombin Time: 20.5 seconds — ABNORMAL HIGH (ref 11.4–15.2)

## 2020-01-15 LAB — CBG MONITORING, ED: Glucose-Capillary: 163 mg/dL — ABNORMAL HIGH (ref 70–99)

## 2020-01-15 LAB — TROPONIN I (HIGH SENSITIVITY)
Troponin I (High Sensitivity): 7 ng/L (ref <18)
Troponin I (High Sensitivity): 7 ng/L (ref <18)

## 2020-01-15 LAB — BRAIN NATRIURETIC PEPTIDE: B Natriuretic Peptide: 92 pg/mL (ref 0.0–100.0)

## 2020-01-15 LAB — APTT: aPTT: 39 seconds — ABNORMAL HIGH (ref 24–36)

## 2020-01-15 MED ORDER — POTASSIUM CHLORIDE CRYS ER 20 MEQ PO TBCR
40.0000 meq | EXTENDED_RELEASE_TABLET | Freq: Once | ORAL | Status: AC
  Administered 2020-01-15: 40 meq via ORAL
  Filled 2020-01-15: qty 2

## 2020-01-15 MED ORDER — MAGNESIUM SULFATE 2 GM/50ML IV SOLN
2.0000 g | Freq: Once | INTRAVENOUS | Status: AC
  Administered 2020-01-15: 2 g via INTRAVENOUS
  Filled 2020-01-15: qty 50

## 2020-01-15 MED ORDER — NITROGLYCERIN 2 % TD OINT
TOPICAL_OINTMENT | TRANSDERMAL | Status: AC
  Filled 2020-01-15: qty 1

## 2020-01-15 MED ORDER — MORPHINE SULFATE (PF) 4 MG/ML IV SOLN
INTRAVENOUS | Status: AC
  Filled 2020-01-15: qty 1

## 2020-01-15 MED ORDER — TRAMADOL HCL 50 MG PO TABS
50.0000 mg | ORAL_TABLET | Freq: Once | ORAL | Status: AC
  Administered 2020-01-15: 50 mg via ORAL
  Filled 2020-01-15: qty 1

## 2020-01-15 MED ORDER — TRAMADOL HCL 50 MG PO TABS: 50.0000 mg | ORAL_TABLET | Freq: Four times a day (QID) | ORAL | 0 refills | Status: DC | PRN

## 2020-01-15 MED ORDER — DILTIAZEM HCL 60 MG PO TABS
60.0000 mg | ORAL_TABLET | Freq: Once | ORAL | Status: AC
  Administered 2020-01-15: 60 mg via ORAL
  Filled 2020-01-15: qty 1

## 2020-01-15 MED ORDER — IOHEXOL 350 MG/ML SOLN
60.0000 mL | Freq: Once | INTRAVENOUS | Status: AC | PRN
  Administered 2020-01-15: 60 mL via INTRAVENOUS

## 2020-01-15 MED ORDER — MORPHINE SULFATE (PF) 4 MG/ML IV SOLN
4.0000 mg | Freq: Once | INTRAVENOUS | Status: AC
  Administered 2020-01-15: 4 mg via INTRAVENOUS
  Filled 2020-01-15: qty 1

## 2020-01-15 NOTE — ED Notes (Signed)
Patient verbalizes understanding of discharge instructions. Opportunity for questioning and answers were provided. Armband removed by staff, pt discharged from ED via wheelchair to lobby to wait for friend to go home.

## 2020-01-15 NOTE — ED Provider Notes (Signed)
EKG:  Rhythm: suspect sinus rhythm with PAC Low voltage Rate: 65 QRS: 88 ms QTc: 395 ms ST segments: NS ST changes    Virgel Manifold, MD 01/15/20 [phone]

## 2020-01-15 NOTE — ED Provider Notes (Signed)
[facility] EMERGENCY DEPARTMENT Provider Note   CSN: [id number] Arrival date & time: 01/15/20  1142     History Chief Complaint  Patient presents with  . Chest Pain    [name] is a 64 y.o. female.  [name] is a 64 y.o. female with a history of A. fib, CHF, hypertension, hyperlipidemia, diabetes, COPD, DVT, obesity, arthritis, CKD, who presents to the emergency department for evaluation of chest pain.  Patient reports chest pain began yesterday evening.  It is located over the left side of her chest, and is described as a constant pressure and squeezing sensation that radiates into the left arm, does not radiate elsewhere.  She reports she is intermittently experienced some nausea and diaphoresis with pain.  Pain persisted this morning, she called her PCP who directed her to present to the emergency department.  EMS gave patient 324 of aspirin as well as 1 sublingual nitro tablet.  She denies improvement in her chest pain with sublingual nitro, reports it did help improve nausea.  Vitals stable with EMS, and EKG in transit showed A. fib without obvious ischemic changes.  Denies similar pains previously.  Also reports that she has had some intermittent dull headache and dizziness.  Denies numbness weakness or tingling in any of her extremities.  Patient's daughter is at the bedside with her.        Past Medical History:  Diagnosis Date  . Allergic rhinitis   . Arthritis   . Asthma   . Chest pain 12/27/2017  . Chronic diastolic CHF (congestive heart failure) ([place])   . COPD (chronic obstructive pulmonary disease) ([place])   . Depression   . DM (diabetes mellitus) ([place])   . DVT (deep vein thrombosis) in pregnancy   . Dysrhythmia    atrial fibrilation  . GERD (gastroesophageal reflux disease)   . Headache(784.0)   . HTN (hypertension)   . Hyperlipidemia   . Obesity   . Pneumonia 04/2018   RIGHT LOBE  . Shortness of breath   . Sleep apnea     compliant with CPAP    Patient Active Problem List   Diagnosis Date Noted  . Closed right ankle fracture 09/24/2019  . DKA, type 2 ([place]) 09/24/2019  . HHNC (hyperglycemic hyperosmolar nonketotic coma) ([place]) 02/21/2019  . Hypokalemia 02/21/2019  . Acute kidney injury superimposed on CKD ([place]) 02/21/2019  . Acute on chronic left systolic heart failure ([place]) 07/01/2018  . Congestive heart failure with left ventricular diastolic dysfunction, acute ([place]) 07/01/2018  . Right upper lobe pneumonia 05/14/2018  . Atrial fibrillation with RVR ([place]) 05/14/2018  . Chronic atrial fibrillation ([place]) 12/26/2017  . Diastolic dysfunction 82/50/5397  . Diabetes mellitus type 2 in obese ([place]) 12/26/2017  . Nausea vomiting and diarrhea   . Chest pain 02/26/2012  . OSA (obstructive sleep apnea) 09/21/2011  . Hypoxia 09/21/2011  . Diabetes mellitus ([place]) 02/04/2010  . Morbid obesity ([place]) 02/04/2010  . Atrial fibrillation ([place]) 02/04/2010  . Gastroparesis 02/04/2010  . GASTROENTERITIS 02/04/2010    Past Surgical History:  Procedure Laterality Date  . ABDOMINAL HYSTERECTOMY    . CATARACT EXTRACTION, BILATERAL    . CHOLECYSTECTOMY    . COLONOSCOPY WITH PROPOFOL N/A 03/05/2015   Procedure: COLONOSCOPY WITH PROPOFOL;  Surgeon: Carol Ada, MD;  Location: [facility];  Service: Endoscopy;  Laterality: N/A;  . DIAGNOSTIC LAPAROSCOPY    . ingrown hallux Left   . KNEE SURGERY    . LEFT HEART CATH AND CORONARY  ANGIOGRAPHY N/A 12/31/2017   Procedure: LEFT HEART CATH AND CORONARY ANGIOGRAPHY;  Surgeon: Charolette Forward, MD;  Location: [facility];  Service: Cardiovascular;  Laterality: N/A;     OB History   No obstetric history on file.     Family History  Problem Relation Age of Onset  . Cancer Mother   . Hypertension Mother   . Cancer Father   . CAD Other   . Hypertension Sister   . Hypertension Brother     Social History   Tobacco Use  . Smoking status: Never Smoker  .  Smokeless tobacco: Never Used  Substance Use Topics  . Alcohol use: No  . Drug use: No    Home Medications Prior to Admission medications   Medication Sig Start Date End Date Taking? Authorizing Provider  albuterol (VENTOLIN HFA) 108 (90 Base) MCG/ACT inhaler Inhale 2 puffs into the lungs every 4 (four) hours as needed for wheezing or shortness of breath.  08/15/19  Yes [provider]  dabigatran (PRADAXA) 150 MG CAPS capsule Take 1 capsule (150 mg total) by mouth 2 (two) times daily. 09/26/19  Yes Georgette Shell, MD  nitroGLYCERIN (NITROSTAT) 0.4 MG SL tablet Place 1 tablet (0.4 mg total) under the tongue every 5 (five) minutes x 3 doses as needed for chest pain. 01/08/14  Yes Charolette Forward, MD  Accu-Chek FastClix Lancets MISC Use as directed three times daily 03/10/19   [name], NP  Alcohol Swabs (B-D SINGLE USE SWABS REGULAR) PADS  02/19/19   [provider]  Blood Glucose Monitoring Suppl (TRUE METRIX METER) w/Device KIT Use to check blood sugar daily. 03/10/19   Charlott Rakes, MD  diltiazem (CARDIZEM) 60 MG tablet Take 1 tablet (60 mg total) by mouth every 8 (eight) hours. 09/26/19   Georgette Shell, MD  Dulaglutide (TRULICITY) 1.5 GD/9.2EQ SOPN Inject 1.5 mg into the skin once a week. 12/23/19   Scot Jun, FNP  DULoxetine (CYMBALTA) 60 MG capsule Take 1 capsule (60 mg total) by mouth daily. 05/20/18   Charolette Forward, MD  furosemide (LASIX) 40 MG tablet Take 1 tablet (40 mg total) by mouth daily. Patient taking differently: Take 40 mg by mouth 2 (two) times daily.  02/24/19 02/24/20  Shelly Coss, MD  gabapentin (NEURONTIN) 300 MG capsule Take 600 mg by mouth 2 (two) times daily.  09/13/11   [provider]  glucose blood (TRUE METRIX BLOOD GLUCOSE TEST) test strip Use as instructed to check blood sugar daily. 03/10/19   Charlott Rakes, MD  Insulin Syringe-Needle U-100 (TRUEPLUS INSULIN SYRINGE) 31G X 5/16" 0.3 ML MISC Use to administer  insulin every day. 03/24/19   Newlin, Charlane Ferretti, MD  LEVEMIR 100 UNIT/ML injection INJECT 0.4 MLS (40 UNITS TOTAL) INTO THE SKIN 2 (TWO) TIMES DAILY. Patient taking differently: Inject 50 Units into the skin 2 (two) times daily.  08/07/19   [name], NP  Melatonin 5 MG CAPS Take 1 capsule (5 mg total) by mouth at bedtime as needed. Patient taking differently: Take 5 mg by mouth at bedtime as needed (sleep).  03/04/19   [name], NP  metolazone (ZAROXOLYN) 5 MG tablet Take 5 mg by mouth daily. 06/01/19   [provider]  metoprolol tartrate (LOPRESSOR) 25 MG tablet Take 1 tablet (25 mg total) by mouth 2 (two) times daily. 02/24/19   Shelly Coss, MD  Multiple Vitamins-Minerals (MULTIVITAMIN WOMEN PO) Take 1 tablet by mouth daily.    [provider]  NOVOLOG 100 UNIT/ML injection INJECT 8 UNITS INTO THE SKIN 3 (THREE) TIMES DAILY WITH MEALS. TAKE ONLY WHEN YOUR EAT MORE THAN 50% OF YOUR MEALS Patient taking differently: Inject 5 Units into the skin 3 (three) times daily with meals.  09/02/19   Charlott Rakes, MD  oxyCODONE (ROXICODONE) 5 MG immediate release tablet Take 1 tablet (5 mg total) by mouth every 8 (eight) hours as needed. Patient not taking: Reported on 01/15/2020 09/26/19 09/25/20  Georgette Shell, MD  pantoprazole (PROTONIX) 40 MG tablet Take 1 tablet (40 mg total) by mouth 2 (two) times daily. 01/01/18   Regalado, Belkys A, MD  potassium chloride (MICRO-K) 10 MEQ CR capsule Take 1 capsule (10 mEq total) by mouth daily. 12/25/19   Scot Jun, FNP  potassium chloride SA (KLOR-CON) 20 MEQ tablet Take 1 tablet (20 mEq total) by mouth daily. 10/08/19   [name], NP  RESTASIS 0.05 % ophthalmic emulsion Place 1 drop into both eyes 2 (two) times daily. 11/19/17   [provider]  tiZANidine (ZANAFLEX) 4 MG tablet Take 1 tablet (4 mg total) by mouth every 6 (six) hours as needed for muscle spasms. 12/23/19   Scot Jun, FNP     Allergies    Sulfa antibiotics  Review of Systems   Review of Systems  Constitutional: Negative for chills and fever.  HENT: Negative.   Respiratory: Positive for shortness of breath.   Cardiovascular: Positive for chest pain. Negative for leg swelling.  Gastrointestinal: Negative for abdominal pain, nausea and vomiting.  Genitourinary: Negative for dysuria.  Musculoskeletal: Negative for arthralgias, back pain, myalgias and neck pain.  Skin: Negative for color change and rash.  Neurological: Positive for dizziness, light-headedness and headaches. Negative for syncope.  All other systems reviewed and are negative.   Physical Exam Updated Vital Signs BP (!) 145/69   Pulse (!) 54   Temp 98.6 F (37 C) (Oral)   Resp 16   Ht '5\' 7"'$  (1.702 m)   Wt 124.7 kg   SpO2 95%   BMI 43.07 kg/m   Physical Exam Vitals and nursing note reviewed.  Constitutional:      General: She is not in acute distress.    Appearance: She is well-developed. She is obese. She is not diaphoretic.     Comments: Appears uncomfortable but is in no acute distress, no longer diaphoretic  HENT:     Head: Normocephalic and atraumatic.  Eyes:     General:        Right eye: No discharge.        Left eye: No discharge.     Pupils: Pupils are equal, round, and reactive to light.  Cardiovascular:     Rate and Rhythm: Normal rate and regular rhythm.     Pulses:          Radial pulses are 2+ on the right side and 2+ on the left side.       Dorsalis pedis pulses are 2+ on the right side and 2+ on the left side.     Heart sounds: Normal heart sounds. No murmur. No friction rub. No gallop.   Pulmonary:     Effort: Pulmonary effort is normal. No respiratory distress.     Breath sounds: Normal breath sounds. No wheezing or rales.     Comments: Respirations equal and unlabored, patient able to speak in full sentences, lungs clear to auscultation bilaterally Chest:     Chest wall:  Tenderness present.      Comments: Tenderness to palpation over the left upper chest without overlying skin changes or palpable deformity Abdominal:     General: Bowel sounds are normal. There is no distension.     Palpations: Abdomen is soft. There is no mass.     Tenderness: There is no abdominal tenderness. There is no guarding.     Comments: Abdomen soft, nondistended, nontender to palpation in all quadrants without guarding or peritoneal signs  Musculoskeletal:        General: No deformity.     Cervical back: Neck supple.  Skin:    General: Skin is warm and dry.     Capillary Refill: Capillary refill takes less than 2 seconds.  Neurological:     Mental Status: She is alert.     Coordination: Coordination normal.     Comments: Speech is clear, able to follow commands CN III-XII intact Normal strength in upper and lower extremities bilaterally including dorsiflexion and plantar flexion, strong and equal grip strength Sensation normal to light and sharp touch Moves extremities without ataxia, coordination intact   Psychiatric:        Mood and Affect: Mood is anxious.        Behavior: Behavior normal.     ED Results / Procedures / Treatments   Labs (all labs ordered are listed, but only abnormal results are displayed) Labs Reviewed  BASIC METABOLIC PANEL - Abnormal; Notable for the following components:      Result Value   Potassium 3.1 (*)    Chloride 93 (*)    Glucose, Bld 358 (*)    Creatinine, Ser 1.90 (*)    Calcium 8.7 (*)    GFR calc non Af Amer 28 (*)    GFR calc Af Amer 32 (*)    All other components within normal limits  PROTIME-INR - Abnormal; Notable for the following components:   Prothrombin Time 20.5 (*)    INR 1.8 (*)    All other components within normal limits  APTT - Abnormal; Notable for the following components:   aPTT 39 (*)    All other components within normal limits  MAGNESIUM - Abnormal; Notable for the following components:   Magnesium 1.2 (*)    All other  components within normal limits  CBG MONITORING, ED - Abnormal; Notable for the following components:   Glucose-Capillary 163 (*)    All other components within normal limits  SARS CORONAVIRUS 2 BY RT PCR (HOSPITAL ORDER, [facility])  BRAIN NATRIURETIC PEPTIDE  CBC WITH DIFFERENTIAL/PLATELET  TROPONIN I (HIGH SENSITIVITY)  TROPONIN I (HIGH SENSITIVITY)  TROPONIN I (HIGH SENSITIVITY)    EKG EKG Interpretation  Date/Time:  Thursday Jan 15 2020 19:12:48 EDT Ventricular Rate:  103 PR Interval:    QRS Duration: 109 QT Interval:  396 QTC Calculation: 496 R Axis:   152 Text Interpretation: Atrial fibrillation Right axis deviation Nonspecific repol abnormality, diffuse leads Borderline prolonged QT interval No significant change since prior 2/21 Confirmed by Aletta Edouard [phone]) on 01/15/2020 7:21:32 PM   Radiology CT Angio Chest PE W and/or Wo Contrast  Result Date: 01/15/2020 CLINICAL DATA:  Chest pain since yesterday.  Shortness of breath. EXAM: CT ANGIOGRAPHY CHEST WITH CONTRAST TECHNIQUE: Multidetector CT imaging of the chest was performed using the standard protocol during bolus administration of intravenous contrast. Multiplanar CT image reconstructions and MIPs were obtained to evaluate the vascular anatomy. CONTRAST:  34m OMNIPAQUE IOHEXOL 350 MG/ML  SOLN COMPARISON:  Radiograph earlier this day.  Chest CT 04/09/2016 FINDINGS: Cardiovascular: There are no filling defects within the pulmonary arteries to suggest pulmonary embolus. Aortic atherosclerosis without aneurysm or evidence of dissection. Prominent main pulmonary artery at 3.3 cm. Multi chamber cardiomegaly. No pericardial effusion. Mediastinum/Nodes: Enlarged mediastinal or hilar lymph nodes. No esophageal wall thickening. No visualized thyroid nodule. Lungs/Pleura: Mild motion artifact through the bases. Slight heterogeneous pulmonary parenchyma. Linear atelectasis in the left lower lobe. No  confluent consolidation. No pleural fluid. No pulmonary mass. Upper Abdomen: Cholecystectomy.  No acute findings. Musculoskeletal: Multilevel degenerative change in the spine. There are no acute or suspicious osseous abnormalities. Review of the MIP images confirms the above findings. IMPRESSION: 1. No pulmonary embolus. 2. Multi chamber cardiomegaly. Prominent main pulmonary artery suggesting pulmonary arterial hypertension. 3. Slight heterogeneous pulmonary parenchyma, can be seen with small vessel or small airways disease. Aortic Atherosclerosis (ICD10-I70.0). Electronically Signed   By: Keith Rake M.D.   On: 01/15/2020 19:20   DG Chest Port 1 View  Result Date: 01/15/2020 CLINICAL DATA:  Chest pain. EXAM: PORTABLE CHEST 1 VIEW COMPARISON:  09/24/2019 FINDINGS: The heart is enlarged but appears stable. Patchy lung opacities suspicious for infiltrates. No pleural effusions. IMPRESSION: Patchy bilateral lung opacities suspicious for infiltrates. Electronically Signed   By: Marijo Sanes M.D.   On: 01/15/2020 14:47    Procedures Procedures (including critical care time)  Medications Ordered in ED Medications  nitroGLYCERIN (NITROGLYN) 2 % ointment (  Given 01/15/20 1245)  morphine 4 MG/ML injection (  Given 01/15/20 1345)  potassium chloride SA (KLOR-CON) CR tablet 40 mEq (40 mEq Oral Given 01/15/20 1532)  magnesium sulfate IVPB 2 g 50 mL (0 g Intravenous Stopped 01/15/20 1648)  morphine 4 MG/ML injection 4 mg (4 mg Intravenous Given 01/15/20 1813)  iohexol (OMNIPAQUE) 350 MG/ML injection 60 mL (60 mLs Intravenous Contrast Given 01/15/20 1840)    ED Course  I have reviewed the triage vital signs and the nursing notes.  Pertinent labs & imaging results that were available during my care of the patient were reviewed by me and considered in my medical decision making (see chart for details).    MDM Rules/Calculators/A&P                       Patient presents to the emergency department with  chest pain. Patient nontoxic appearing, in no apparent distress, vitals without significant abnormality. Fairly benign physical exam.  Does have some tenderness over the lysate of the chest, appears uncomfortable but is in no acute distress.  Prior heart catheterization reviewed: 2019, no significant CAD Patient's primary cardiologist: Dr. Terrence Dupont  DDX: ACS, pulmonary embolism, dissection, pneumothorax, pneumonia, arrhythmia, severe anemia, MSK, GERD, anxiety. Evaluation initiated with labs, EKG, and CXR. Patient on cardiac monitor.   CBC: No leukocytosis, normal hemoglobin BMP: Mild hypokalemia of 3.1 we will give p.o. potassium replacement and check magnesium, glucose of 358, but normal anion gap, creatinine of 1.9, elevated but stable compared to recent lab work.  Hypomagnesemia as well, 2 g of IV mag given Troponin: 7, delta troponin flat at 7 EKG: A. fib without specific ischemic changes CXR: Patchy bilateral lung opacities suspicious for infiltrate, but patient without cough, fever and primary complaint is left-sided chest pain, will get CTA to better characterize opacity infiltrates and assess for other potential causes of patient's chest pain given reassuring work-up thus far  CTA: No PE or evidence of aneurysm or dissection.  Multichamber cardiomegaly, and prominent main pulmonary artery suggesting pulmonary arterial hypertension.  There is slight heterogeneous lung parenchyma which can be seen with small vessel or airway disease, likely chronic, no confluent consolidation to suggest pneumonia.  Patient did not have relief in her chest pain with Nitropaste  HEAR Score: 6.  Elevated heart score with significant cardiac risk factors and previous NSTEMI with similar symptoms.  Despite reassuring troponins patient symptoms are concerning.  Patient did have a cath done in 2019 that showed minimal coronary artery disease.  Heart pathway recommends observational admission or close outpatient  follow-up.  Will discuss with cardiology.  Case discussed with Dr. Terrence Dupont who knows the patient well, states she has been seen for similar episodes of pain multiple times and has had extensive work-up including negative stress test and reassuring heart cath.  He is very reassured by patient's negative troponins and clear CTA.  He does not feel that the patient needs admission.  States that tramadol has been helpful for her in the past with similar episodes of pain, and he can see her in the office tomorrow for continued evaluation.  Discussed this plan with patient who is in agreement.  She does have mild tachycardia, has not had her evening dose of Cardizem, this will be given to her as well as a dose of tramadol and then she will be discharged with a short supply until she can follow-up with her cardiologist tomorrow.  At this time there does not appear to be any evidence of an acute emergency medical condition and the patient appears stable for discharge with appropriate outpatient follow up. Diagnosis was discussed with patient who verbalizes understanding and is agreeable to discharge.  Patient discussed with Dr. Wilson Singer, who saw patient as well and agrees with plan.    Final Clinical Impression(s) / ED Diagnoses Final diagnoses:  Left-sided chest pain    Rx / DC Orders ED Discharge Orders    None       Janet Berlin 01/15/20 2051    Virgel Manifold, MD 01/17/20 1727

## 2020-01-15 NOTE — Telephone Encounter (Signed)
Patient called stating she is having a burning sensation on the left said of her chest. Patient was advice to go ahead and go to the Emergency room for further evaluation.  This is just an [place]

## 2020-01-15 NOTE — Discharge Instructions (Addendum)
You may use tramadol as needed to help with chest pain.  Please call tomorrow morning, Dr. Terrence Dupont wants to see you in the office tomorrow regarding this chest pain.  Your work up today has been reassuring, pain may be related to musculoskeletal pain or inflammation.

## 2020-01-15 NOTE — ED Triage Notes (Signed)
PT BIB by [facility] after reports of c/p since yesterday afternoon. Pt reports n/v diaphoresis last night with left sided c/p that radiates down left arm. PT was given 324 aspirin and 0.4 mg Nitro SL on ambulance. Upon arrival, pt denies nausea but still has left side c/p with radiation. Pt HR 78, B/P 115/72 and O2 sat 99. Pt has hx of CHF, NSTEMI, DM, and afib.

## 2020-01-15 NOTE — ED Notes (Signed)
Transported to CT 

## 2020-01-20 ENCOUNTER — Telehealth (INDEPENDENT_AMBULATORY_CARE_PROVIDER_SITE_OTHER): Payer: Self-pay

## 2020-01-20 NOTE — Telephone Encounter (Signed)
Please have patient schedule appointment or present to urgent care.

## 2020-01-20 NOTE — Telephone Encounter (Signed)
Patient called stating she is having bowel movements with out notice. Patient states it goes up her back while she is sleeping and also in the morning. Patient would like to know if PCP can send in any medication to [facility] on [address] or if she needs to make an appointment.    Please advice [phone]

## 2020-01-24 DIAGNOSIS — S82891A Other fracture of right lower leg, initial encounter for closed fracture: Secondary | ICD-10-CM | POA: Diagnosis not present

## 2020-01-28 ENCOUNTER — Telehealth (INDEPENDENT_AMBULATORY_CARE_PROVIDER_SITE_OTHER): Payer: Self-pay | Admitting: Primary Care

## 2020-01-28 NOTE — Telephone Encounter (Signed)
error 

## 2020-01-30 ENCOUNTER — Emergency Department (HOSPITAL_COMMUNITY)
Admission: EM | Admit: 2020-01-30 | Discharge: 2020-01-30 | Disposition: A | Discharge status: Home / Self Care | Payer: Medicare HMO | Attending: Emergency Medicine | Admitting: Emergency Medicine

## 2020-01-30 ENCOUNTER — Emergency Department (HOSPITAL_COMMUNITY): Payer: Medicare HMO

## 2020-01-30 ENCOUNTER — Other Ambulatory Visit: Payer: Self-pay

## 2020-01-30 DIAGNOSIS — R0789 Other chest pain: Secondary | ICD-10-CM | POA: Diagnosis not present

## 2020-01-30 DIAGNOSIS — I213 ST elevation (STEMI) myocardial infarction of unspecified site: Secondary | ICD-10-CM | POA: Diagnosis not present

## 2020-01-30 DIAGNOSIS — I252 Old myocardial infarction: Secondary | ICD-10-CM | POA: Diagnosis not present

## 2020-01-30 DIAGNOSIS — I4891 Unspecified atrial fibrillation: Secondary | ICD-10-CM | POA: Diagnosis not present

## 2020-01-30 DIAGNOSIS — R079 Chest pain, unspecified: Secondary | ICD-10-CM | POA: Diagnosis not present

## 2020-01-30 DIAGNOSIS — E119 Type 2 diabetes mellitus without complications: Secondary | ICD-10-CM | POA: Diagnosis not present

## 2020-01-30 DIAGNOSIS — I110 Hypertensive heart disease with heart failure: Secondary | ICD-10-CM | POA: Diagnosis not present

## 2020-01-30 DIAGNOSIS — R0602 Shortness of breath: Secondary | ICD-10-CM | POA: Diagnosis not present

## 2020-01-30 DIAGNOSIS — Z794 Long term (current) use of insulin: Secondary | ICD-10-CM | POA: Diagnosis not present

## 2020-01-30 DIAGNOSIS — R109 Unspecified abdominal pain: Secondary | ICD-10-CM | POA: Insufficient documentation

## 2020-01-30 DIAGNOSIS — Z79899 Other long term (current) drug therapy: Secondary | ICD-10-CM | POA: Diagnosis not present

## 2020-01-30 DIAGNOSIS — R112 Nausea with vomiting, unspecified: Secondary | ICD-10-CM | POA: Insufficient documentation

## 2020-01-30 DIAGNOSIS — I5032 Chronic diastolic (congestive) heart failure: Secondary | ICD-10-CM | POA: Insufficient documentation

## 2020-01-30 DIAGNOSIS — I499 Cardiac arrhythmia, unspecified: Secondary | ICD-10-CM | POA: Diagnosis not present

## 2020-01-30 DIAGNOSIS — R1084 Generalized abdominal pain: Secondary | ICD-10-CM | POA: Diagnosis not present

## 2020-01-30 LAB — CBC WITH DIFFERENTIAL/PLATELET
Abs Immature Granulocytes: 0.02 10*3/uL (ref 0.00–0.07)
Basophils Absolute: 0 10*3/uL (ref 0.0–0.1)
Basophils Relative: 0 %
Eosinophils Absolute: 0 10*3/uL (ref 0.0–0.5)
Eosinophils Relative: 0 %
HCT: 46 % (ref 36.0–46.0)
Hemoglobin: 15.3 g/dL — ABNORMAL HIGH (ref 12.0–15.0)
Immature Granulocytes: 0 %
Lymphocytes Relative: 23 %
Lymphs Abs: 1.8 10*3/uL (ref 0.7–4.0)
MCH: 29.2 pg (ref 26.0–34.0)
MCHC: 33.3 g/dL (ref 30.0–36.0)
MCV: 87.8 fL (ref 80.0–100.0)
Monocytes Absolute: 0.5 10*3/uL (ref 0.1–1.0)
Monocytes Relative: 7 %
Neutro Abs: 5.7 10*3/uL (ref 1.7–7.7)
Neutrophils Relative %: 70 %
Platelets: 252 10*3/uL (ref 150–400)
RBC: 5.24 MIL/uL — ABNORMAL HIGH (ref 3.87–5.11)
RDW: 12.6 % (ref 11.5–15.5)
WBC: 8.1 10*3/uL (ref 4.0–10.5)
nRBC: 0 % (ref 0.0–0.2)

## 2020-01-30 LAB — BASIC METABOLIC PANEL
Anion gap: 14 (ref 5–15)
BUN: 13 mg/dL (ref 8–23)
CO2: 27 mmol/L (ref 22–32)
Calcium: 9.4 mg/dL (ref 8.9–10.3)
Chloride: 98 mmol/L (ref 98–111)
Creatinine, Ser: 1.61 mg/dL — ABNORMAL HIGH (ref 0.44–1.00)
GFR calc Af Amer: 39 mL/min — ABNORMAL LOW (ref >60)
GFR calc non Af Amer: 34 mL/min — ABNORMAL LOW (ref >60)
Glucose, Bld: 203 mg/dL — ABNORMAL HIGH (ref 70–99)
Potassium: 3.8 mmol/L (ref 3.5–5.1)
Sodium: 139 mmol/L (ref 135–145)

## 2020-01-30 LAB — TROPONIN I (HIGH SENSITIVITY)
Troponin I (High Sensitivity): 6 ng/L (ref <18)
Troponin I (High Sensitivity): 7 ng/L (ref <18)

## 2020-01-30 MED ORDER — IOHEXOL 300 MG/ML  SOLN
100.0000 mL | Freq: Once | INTRAMUSCULAR | Status: AC | PRN
  Administered 2020-01-30: 100 mL via INTRAVENOUS

## 2020-01-30 MED ORDER — SODIUM CHLORIDE 0.9 % IV SOLN: INTRAVENOUS | Status: DC

## 2020-01-30 MED ORDER — ONDANSETRON HCL 4 MG/2ML IJ SOLN
4.0000 mg | Freq: Once | INTRAMUSCULAR | Status: AC
  Administered 2020-01-30: 4 mg via INTRAVENOUS
  Filled 2020-01-30: qty 2

## 2020-01-30 MED ORDER — HALOPERIDOL LACTATE 5 MG/ML IJ SOLN
2.5000 mg | Freq: Once | INTRAMUSCULAR | Status: AC
  Administered 2020-01-30: 2.5 mg via INTRAVENOUS
  Filled 2020-01-30: qty 1

## 2020-01-30 MED ORDER — MORPHINE SULFATE (PF) 4 MG/ML IV SOLN
4.0000 mg | Freq: Once | INTRAVENOUS | Status: AC
  Administered 2020-01-30: 4 mg via INTRAVENOUS
  Filled 2020-01-30: qty 1

## 2020-01-30 MED ORDER — ONDANSETRON HCL 4 MG PO TABS
4.0000 mg | ORAL_TABLET | Freq: Three times a day (TID) | ORAL | 0 refills | Status: DC | PRN
Start: 2020-01-30 — End: 2020-02-24

## 2020-01-30 MED ORDER — SUCRALFATE 1 G PO TABS
1.0000 g | ORAL_TABLET | Freq: Four times a day (QID) | ORAL | 0 refills | Status: DC | PRN
Start: 2020-01-30 — End: 2020-03-31

## 2020-01-30 NOTE — ED Triage Notes (Signed)
Pt arrives via EMS from home with c/o of chest pain X2 days. Generally feeling week. N/V began last. EMS reports pt currently in Afib. History of heart failure and MI.  Alert and oriented X4. Denies being SOB  140/90 P: 100-140 100% CBG 188  96.9  324 ASA given en route

## 2020-01-30 NOTE — ED Provider Notes (Signed)
[facility] EMERGENCY DEPARTMENT Provider Note   CSN: [id number] Arrival date & time: 01/30/20  1354     History Chief Complaint  Patient presents with  . Chest Pain    [name] is a 64 y.o. female.  HPI   64 year old female with abdominal pain and nausea/vomiting.  Actually saw her in the emergency room about 2 weeks ago with somewhat similar symptoms.  We ended up discharging her at that time.  She states that she never felt completely better although symptoms did improve until a couple days ago.  Pain in upper abdomen radiating to her chest.  Describes it as achy, sharp and burning.  Nausea and vomited several times.  No diarrhea.  No acute respiratory symptoms.  No fevers.  No sick contacts that she is aware of.  Past Medical History:  Diagnosis Date  . Allergic rhinitis   . Arthritis   . Asthma   . Chest pain 12/27/2017  . Chronic diastolic CHF (congestive heart failure) ([place])   . COPD (chronic obstructive pulmonary disease) ([place])   . Depression   . DM (diabetes mellitus) ([place])   . DVT (deep vein thrombosis) in pregnancy   . Dysrhythmia    atrial fibrilation  . GERD (gastroesophageal reflux disease)   . Headache(784.0)   . HTN (hypertension)   . Hyperlipidemia   . Obesity   . Pneumonia 04/2018   RIGHT LOBE  . Shortness of breath   . Sleep apnea    compliant with CPAP    Patient Active Problem List   Diagnosis Date Noted  . Closed right ankle fracture 09/24/2019  . DKA, type 2 ([place]) 09/24/2019  . HHNC (hyperglycemic hyperosmolar nonketotic coma) ([place]) 02/21/2019  . Hypokalemia 02/21/2019  . Acute kidney injury superimposed on CKD ([place]) 02/21/2019  . Acute on chronic left systolic heart failure ([place]) 07/01/2018  . Congestive heart failure with left ventricular diastolic dysfunction, acute ([place]) 07/01/2018  . Right upper lobe pneumonia 05/14/2018  . Atrial fibrillation with RVR ([place]) 05/14/2018  . Chronic atrial fibrillation  ([place]) 12/26/2017  . Diastolic dysfunction 51/09/5850  . Diabetes mellitus type 2 in obese ([place]) 12/26/2017  . Nausea vomiting and diarrhea   . Chest pain 02/26/2012  . OSA (obstructive sleep apnea) 09/21/2011  . Hypoxia 09/21/2011  . Diabetes mellitus ([place]) 02/04/2010  . Morbid obesity ([place]) 02/04/2010  . Atrial fibrillation ([place]) 02/04/2010  . Gastroparesis 02/04/2010  . GASTROENTERITIS 02/04/2010    Past Surgical History:  Procedure Laterality Date  . ABDOMINAL HYSTERECTOMY    . CATARACT EXTRACTION, BILATERAL    . CHOLECYSTECTOMY    . COLONOSCOPY WITH PROPOFOL N/A 03/05/2015   Procedure: COLONOSCOPY WITH PROPOFOL;  Surgeon: Carol Ada, MD;  Location: [facility];  Service: Endoscopy;  Laterality: N/A;  . DIAGNOSTIC LAPAROSCOPY    . ingrown hallux Left   . KNEE SURGERY    . LEFT HEART CATH AND CORONARY ANGIOGRAPHY N/A 12/31/2017   Procedure: LEFT HEART CATH AND CORONARY ANGIOGRAPHY;  Surgeon: Charolette Forward, MD;  Location: [facility];  Service: Cardiovascular;  Laterality: N/A;     OB History   No obstetric history on file.     Family History  Problem Relation Age of Onset  . Cancer Mother   . Hypertension Mother   . Cancer Father   . CAD Other   . Hypertension Sister   . Hypertension Brother     Social History   Tobacco Use  . Smoking status: Never  Smoker  . Smokeless tobacco: Never Used  Vaping Use  . Vaping Use: Never used  Substance Use Topics  . Alcohol use: No  . Drug use: No    Home Medications Prior to Admission medications   Medication Sig Start Date End Date Taking? Authorizing Provider  Accu-Chek FastClix Lancets MISC Use as directed three times daily 03/10/19   [name], NP  albuterol (VENTOLIN HFA) 108 (90 Base) MCG/ACT inhaler Inhale 2 puffs into the lungs every 4 (four) hours as needed for wheezing or shortness of breath.  08/15/19   [provider]  Alcohol Swabs (B-D SINGLE USE SWABS REGULAR) PADS  02/19/19    [provider]  Blood Glucose Monitoring Suppl (TRUE METRIX METER) w/Device KIT Use to check blood sugar daily. 03/10/19   Charlott Rakes, MD  dabigatran (PRADAXA) 150 MG CAPS capsule Take 1 capsule (150 mg total) by mouth 2 (two) times daily. 09/26/19   Georgette Shell, MD  digoxin (LANOXIN) 0.125 MG tablet Take 125 mcg by mouth daily. 11/04/19   [provider]  diltiazem (CARDIZEM) 60 MG tablet Take 1 tablet (60 mg total) by mouth every 8 (eight) hours. Patient taking differently: Take 60 mg by mouth 3 (three) times daily.  09/26/19   Georgette Shell, MD  Dulaglutide (TRULICITY) 1.5 ZO/1.0RU SOPN Inject 1.5 mg into the skin once a week. 12/23/19   Scot Jun, FNP  DULoxetine (CYMBALTA) 60 MG capsule Take 1 capsule (60 mg total) by mouth daily. 05/20/18   Charolette Forward, MD  furosemide (LASIX) 40 MG tablet Take 1 tablet (40 mg total) by mouth daily. Patient taking differently: Take 40 mg by mouth 2 (two) times daily.  02/24/19 02/24/20  Shelly Coss, MD  gabapentin (NEURONTIN) 300 MG capsule Take 300 mg by mouth 2 (two) times daily.  09/13/11   [provider]  glucose blood (TRUE METRIX BLOOD GLUCOSE TEST) test strip Use as instructed to check blood sugar daily. 03/10/19   Charlott Rakes, MD  Insulin Syringe-Needle U-100 (TRUEPLUS INSULIN SYRINGE) 31G X 5/16" 0.3 ML MISC Use to administer insulin every day. 03/24/19   Newlin, Charlane Ferretti, MD  LEVEMIR 100 UNIT/ML injection INJECT 0.4 MLS (40 UNITS TOTAL) INTO THE SKIN 2 (TWO) TIMES DAILY. Patient taking differently: Inject 50 Units into the skin 2 (two) times daily.  08/07/19   [name], NP  losartan (COZAAR) 25 MG tablet Take 25 mg by mouth daily. 11/26/19   [provider]  Melatonin 5 MG CAPS Take 1 capsule (5 mg total) by mouth at bedtime as needed. Patient taking differently: Take 5 mg by mouth at bedtime as needed (sleep).  03/04/19   [name], NP  metolazone (ZAROXOLYN) 5 MG tablet  Take 5 mg by mouth daily. 06/01/19   [provider]  metoprolol tartrate (LOPRESSOR) 25 MG tablet Take 1 tablet (25 mg total) by mouth 2 (two) times daily. 02/24/19   Shelly Coss, MD  Multiple Vitamins-Minerals (MULTIVITAMIN WOMEN PO) Take 1 tablet by mouth daily.    [provider]  nitroGLYCERIN (NITROSTAT) 0.4 MG SL tablet Place 1 tablet (0.4 mg total) under the tongue every 5 (five) minutes x 3 doses as needed for chest pain. 01/08/14   Charolette Forward, MD  NOVOLOG 100 UNIT/ML injection INJECT 8 UNITS INTO THE SKIN 3 (THREE) TIMES DAILY WITH MEALS. TAKE ONLY WHEN YOUR EAT MORE THAN 50% OF YOUR MEALS Patient taking differently: Inject 5 Units into the skin 3 (  three) times daily with meals.  09/02/19   Charlott Rakes, MD  oxyCODONE (ROXICODONE) 5 MG immediate release tablet Take 1 tablet (5 mg total) by mouth every 8 (eight) hours as needed. Patient not taking: Reported on 01/15/2020 09/26/19 09/25/20  Georgette Shell, MD  pantoprazole (PROTONIX) 40 MG tablet Take 1 tablet (40 mg total) by mouth 2 (two) times daily. 01/01/18   Regalado, Belkys A, MD  potassium chloride (MICRO-K) 10 MEQ CR capsule Take 1 capsule (10 mEq total) by mouth daily. 12/25/19   Scot Jun, FNP  potassium chloride SA (KLOR-CON) 20 MEQ tablet Take 1 tablet (20 mEq total) by mouth daily. Patient not taking: Reported on 01/15/2020 10/08/19   [name], NP  RESTASIS 0.05 % ophthalmic emulsion Place 1 drop into both eyes 2 (two) times daily. 11/19/17   [provider]  rosuvastatin (CRESTOR) 10 MG tablet Take 10 mg by mouth daily. 12/07/19   [provider]  tiZANidine (ZANAFLEX) 4 MG tablet Take 1 tablet (4 mg total) by mouth every 6 (six) hours as needed for muscle spasms. 12/23/19   Scot Jun, FNP  traMADol (ULTRAM) 50 MG tablet Take 1 tablet (50 mg total) by mouth every 6 (six) hours as needed. 01/15/20   [name], PA-C    Allergies    Sulfa antibiotics  Review  of Systems   Review of Systems All systems reviewed and negative, other than as noted in HPI.  Physical Exam Updated Vital Signs BP (!) 130/97   Pulse (!) 48   Temp 98.4 F (36.9 C) (Oral)   Resp (!) 21   SpO2 98%   Physical Exam Vitals and nursing note reviewed.  Constitutional:      Appearance: She is well-developed. She is obese.     Comments: Laying in bed.  Appears uncomfortable, but not toxic.  HENT:     Head: Normocephalic and atraumatic.  Eyes:     General:        Right eye: No discharge.        Left eye: No discharge.     Conjunctiva/sclera: Conjunctivae normal.  Cardiovascular:     Rate and Rhythm: Normal rate. Rhythm irregular.     Heart sounds: Normal heart sounds. No murmur heard.  No friction rub. No gallop.   Pulmonary:     Effort: Pulmonary effort is normal. No respiratory distress.     Breath sounds: Normal breath sounds.  Abdominal:     Palpations: Abdomen is soft.     Tenderness: There is abdominal tenderness. There is no guarding or rebound.     Comments: Mild to moderate diffuse abdominal tenderness, somewhat worse in left upper quadrant.  Musculoskeletal:        General: No tenderness.     Cervical back: Neck supple.  Skin:    General: Skin is warm and dry.  Neurological:     Mental Status: She is alert.  Psychiatric:        Behavior: Behavior normal.        Thought Content: Thought content normal.     ED Results / Procedures / Treatments   Labs (all labs ordered are listed, but only abnormal results are displayed) Labs Reviewed  CBC WITH DIFFERENTIAL/PLATELET - Abnormal; Notable for the following components:      Result Value   RBC 5.24 (*)    Hemoglobin 15.3 (*)    All other components within normal limits  BASIC METABOLIC PANEL - Abnormal;  Notable for the following components:   Glucose, Bld 203 (*)    Creatinine, Ser 1.61 (*)    GFR calc non Af Amer 34 (*)    GFR calc Af Amer 39 (*)    All other components within normal limits    TROPONIN I (HIGH SENSITIVITY)  TROPONIN I (HIGH SENSITIVITY)    EKG EKG Interpretation  Date/Time:  Friday January 30 2020 14:07:10 EDT Ventricular Rate:  103 PR Interval:    QRS Duration: 96 QT Interval:  320 QTC Calculation: 419 R Axis:   -36 Text Interpretation: Atrial fibrillation Ventricular premature complex Left axis deviation Probable anterior infarct, age indeterminate Confirmed by Virgel Manifold [phone]) on 01/30/2020 3:18:14 PM   Radiology DG Chest Portable 1 View  Result Date: 01/30/2020 CLINICAL DATA:  Chest pain and shortness of breath EXAM: PORTABLE CHEST 1 VIEW COMPARISON:  Jan 15, 2020 FINDINGS: The lungs are clear. Heart is mildly enlarged with pulmonary vascularity normal. No adenopathy. No pneumothorax. No bone lesions. IMPRESSION: Mild cardiac enlargement. No edema or airspace opacity. No adenopathy. Electronically Signed   By: Lowella Grip III M.D.   On: 01/30/2020 14:48    Procedures Procedures (including critical care time)  Medications Ordered in ED Medications  0.9 %  sodium chloride infusion ( Intravenous Bolus from Bag 01/30/20 1621)  ondansetron (ZOFRAN) injection 4 mg (4 mg Intravenous Given 01/30/20 1537)  morphine 4 MG/ML injection 4 mg (4 mg Intravenous Given 01/30/20 1538)  haloperidol lactate (HALDOL) injection 2.5 mg (2.5 mg Intravenous Given 01/30/20 1617)    ED Course  I have reviewed the triage vital signs and the nursing notes.  Pertinent labs & imaging results that were available during my care of the patient were reviewed by me and considered in my medical decision making (see chart for details).    MDM Rules/Calculators/A&P                          64 year old female with abdominal/chest pain and nausea/vomiting.  I saw her 2 weeks ago with somewhat similar symptoms although she does seem more tender to me on her abdominal exam today than she did previously.  Prior work-up was pretty unremarkable with regards to negative troponins.   She also had a CT of her chest which did not show any acute abnormalities.  Had discussion with her PCP at that time who was very familiar with patient.  Apparently she has these symptoms with some frequency.  Since work-up was reassuring, she was discharged at that time.    Will CT her abdomen and pelvis today.  We will treat her symptoms and check labs in the meantime.  Minimal CAD on LHC in 2019:   Prox LAD lesion is 10% stenosed.  Dist LM to Ost LAD lesion is 10% stenosed.  Ost Cx lesion is 15% stenosed.  Final Clinical Impression(s) / ED Diagnoses Final diagnoses:  Abdominal pain, unspecified abdominal location  Nausea and vomiting, intractability of vomiting not specified, unspecified vomiting type    Rx / DC Orders ED Discharge Orders    None       Virgel Manifold, MD 01/30/20 1637

## 2020-01-30 NOTE — Discharge Instructions (Addendum)
You were evaluated in the Emergency Department and after careful evaluation, we did not find any emergent condition requiring admission or further testing in the hospital. ° °Your exam/testing today was overall reassuring. ° °Please return to the Emergency Department if you experience any worsening of your condition.  We encourage you to follow up with a primary care provider.  Thank you for allowing us to be a part of your care. ° °

## 2020-01-30 NOTE — ED Notes (Signed)
Patient verbalizes understanding of discharge instructions. Opportunity for questioning and answers were provided. Armband removed by staff, pt discharged from ED. Pt. ambulatory and discharged home.  

## 2020-01-30 NOTE — ED Provider Notes (Signed)
  Provider Note MRN:  [id number]  Arrival date & time: 01/30/20    ED Course and Medical Decision Making  Assumed care from Dr. Wilson Singer at shift change.  2 or 3 weeks of unexplained lower chest and upper abdominal pain, work appears reassuring, CT results have come back and are normal.  Patient continues to have normal vital signs, well-appearing, pain adequately controlled, appropriate for discharge with PCP follow-up.  Procedures  Final Clinical Impressions(s) / ED Diagnoses     ICD-10-CM   1. Abdominal pain, unspecified abdominal location  R10.9   2. Nausea and vomiting, intractability of vomiting not specified, unspecified vomiting type  R11.2     ED Discharge Orders         Ordered    ondansetron (ZOFRAN) 4 MG tablet  Every 8 hours PRN     Discontinue  Reprint     01/30/20 1636    sucralfate (CARAFATE) 1 g tablet  4 times daily PRN     Discontinue  Reprint     01/30/20 2013            Discharge Instructions     You were evaluated in the Emergency Department and after careful evaluation, we did not find any emergent condition requiring admission or further testing in the hospital.  Your exam/testing today was overall reassuring.  Please return to the Emergency Department if you experience any worsening of your condition.  We encourage you to follow up with a primary care provider.  Thank you for allowing [place] to be a part of your care.     Barth Kirks. [place]onia Small, [place] [email or web address]    Maudie Flakes, MD 01/30/20 2015

## 2020-02-02 ENCOUNTER — Telehealth (INDEPENDENT_AMBULATORY_CARE_PROVIDER_SITE_OTHER): Payer: Medicare HMO | Admitting: Primary Care

## 2020-02-05 DIAGNOSIS — H40023 Open angle with borderline findings, high risk, bilateral: Secondary | ICD-10-CM | POA: Diagnosis not present

## 2020-02-05 DIAGNOSIS — H43813 Vitreous degeneration, bilateral: Secondary | ICD-10-CM | POA: Diagnosis not present

## 2020-02-05 DIAGNOSIS — E119 Type 2 diabetes mellitus without complications: Secondary | ICD-10-CM | POA: Diagnosis not present

## 2020-02-05 DIAGNOSIS — H524 Presbyopia: Secondary | ICD-10-CM | POA: Diagnosis not present

## 2020-02-05 DIAGNOSIS — H04123 Dry eye syndrome of bilateral lacrimal glands: Secondary | ICD-10-CM | POA: Diagnosis not present

## 2020-02-05 DIAGNOSIS — H5203 Hypermetropia, bilateral: Secondary | ICD-10-CM | POA: Diagnosis not present

## 2020-02-05 DIAGNOSIS — Z794 Long term (current) use of insulin: Secondary | ICD-10-CM | POA: Diagnosis not present

## 2020-02-05 DIAGNOSIS — H35373 Puckering of macula, bilateral: Secondary | ICD-10-CM | POA: Diagnosis not present

## 2020-02-05 DIAGNOSIS — H3589 Other specified retinal disorders: Secondary | ICD-10-CM | POA: Diagnosis not present

## 2020-02-23 DIAGNOSIS — S82891A Other fracture of right lower leg, initial encounter for closed fracture: Secondary | ICD-10-CM | POA: Diagnosis not present

## 2020-02-24 ENCOUNTER — Ambulatory Visit (INDEPENDENT_AMBULATORY_CARE_PROVIDER_SITE_OTHER): Payer: Medicare HMO | Admitting: Primary Care

## 2020-02-24 ENCOUNTER — Encounter (INDEPENDENT_AMBULATORY_CARE_PROVIDER_SITE_OTHER): Payer: Self-pay | Admitting: Primary Care

## 2020-02-24 ENCOUNTER — Other Ambulatory Visit: Payer: Self-pay

## 2020-02-24 VITALS — BP 126/73 | HR 66 | Temp 98.0°F | Ht 67.0 in | Wt 270.0 lb

## 2020-02-24 DIAGNOSIS — M25512 Pain in left shoulder: Secondary | ICD-10-CM | POA: Diagnosis not present

## 2020-02-24 DIAGNOSIS — Z1231 Encounter for screening mammogram for malignant neoplasm of breast: Secondary | ICD-10-CM | POA: Diagnosis not present

## 2020-02-24 DIAGNOSIS — E119 Type 2 diabetes mellitus without complications: Secondary | ICD-10-CM

## 2020-02-24 DIAGNOSIS — I5189 Other ill-defined heart diseases: Secondary | ICD-10-CM

## 2020-02-24 DIAGNOSIS — I482 Chronic atrial fibrillation, unspecified: Secondary | ICD-10-CM | POA: Diagnosis not present

## 2020-02-24 LAB — POCT CBG (FASTING - GLUCOSE)-MANUAL ENTRY: Glucose Fasting, POC: 348 mg/dL — AB (ref 70–99)

## 2020-02-24 MED ORDER — TRAMADOL HCL 50 MG PO TABS: 50.0000 mg | ORAL_TABLET | Freq: Three times a day (TID) | ORAL | 0 refills | Status: DC | PRN

## 2020-02-24 NOTE — Progress Notes (Signed)
Established Patient Office Visit  Subjective:  Patient ID: [name], female    DOB: [birth year]  Age: 64 y.o. MRN: [id number]  CC:  Chief Complaint  Patient presents with  . Follow-up    per Claudine Mouton    HPI [name]  Is 64 year old female in today to follow up on diabetes, She endorses shortness of breath, headaches, chest pain no  lower extremity edema. States when see's heart doctor he tells her to loose weight and stay out of the hospital. She is requesting a new heart doctor.  Past Medical History:  Diagnosis Date  . Allergic rhinitis   . Arthritis   . Asthma   . Chest pain 12/27/2017  . Chronic diastolic CHF (congestive heart failure) ([place])   . COPD (chronic obstructive pulmonary disease) ([place])   . Depression   . DM (diabetes mellitus) ([place])   . DVT (deep vein thrombosis) in pregnancy   . Dysrhythmia    atrial fibrilation  . GERD (gastroesophageal reflux disease)   . Headache(784.0)   . HTN (hypertension)   . Hyperlipidemia   . Obesity   . Pneumonia 04/2018   RIGHT LOBE  . Shortness of breath   . Sleep apnea    compliant with CPAP    Past Surgical History:  Procedure Laterality Date  . ABDOMINAL HYSTERECTOMY    . CATARACT EXTRACTION, BILATERAL    . CHOLECYSTECTOMY    . COLONOSCOPY WITH PROPOFOL N/A 03/05/2015   Procedure: COLONOSCOPY WITH PROPOFOL;  Surgeon: Carol Ada, MD;  Location: [facility];  Service: Endoscopy;  Laterality: N/A;  . DIAGNOSTIC LAPAROSCOPY    . ingrown hallux Left   . KNEE SURGERY    . LEFT HEART CATH AND CORONARY ANGIOGRAPHY N/A 12/31/2017   Procedure: LEFT HEART CATH AND CORONARY ANGIOGRAPHY;  Surgeon: Charolette Forward, MD;  Location: [facility];  Service: Cardiovascular;  Laterality: N/A;    Family History  Problem Relation Age of Onset  . Cancer Mother   . Hypertension Mother   . Cancer Father   . CAD Other   . Hypertension Sister   . Hypertension Brother     Social History    Socioeconomic History  . Marital status: Widowed    Spouse name: Not on file  . Number of children: Not on file  . Years of education: Not on file  . Highest education level: Not on file  Occupational History  . Not on file  Tobacco Use  . Smoking status: Never Smoker  . Smokeless tobacco: Never Used  Vaping Use  . Vaping Use: Never used  Substance and Sexual Activity  . Alcohol use: No  . Drug use: No  . Sexual activity: Not on file  Other Topics Concern  . Not on file  Social History Narrative   Pt lives in [place] with spouse.  2 grown children.   Previously worked in [employer] at [employer].  Now on disability   Social Determinants of Health   Financial Resource Strain:   . Difficulty of Paying Living Expenses:   Food Insecurity:   . Worried About Charity fundraiser in the Last Year:   . Arboriculturist in the Last Year:   Transportation Needs:   . Film/video editor (Medical):   Marland Kitchen Lack of Transportation (Non-Medical):   Physical Activity:   . Days of Exercise per Week:   . Minutes of Exercise per Session:   Stress:   .  Feeling of Stress :   Social Connections:   . Frequency of Communication with Friends and Family:   . Frequency of Social Gatherings with Friends and Family:   . Attends Religious Services:   . Active Member of Clubs or Organizations:   . Attends Archivist Meetings:   Marland Kitchen Marital Status:   Intimate Partner Violence:   . Fear of Current or Ex-Partner:   . Emotionally Abused:   Marland Kitchen Physically Abused:   . Sexually Abused:     Outpatient Medications Prior to Visit  Medication Sig Dispense Refill  . Accu-Chek FastClix Lancets MISC Use as directed three times daily 102 each 12  . albuterol (VENTOLIN HFA) 108 (90 Base) MCG/ACT inhaler Inhale 2 puffs into the lungs every 4 (four) hours as needed for wheezing or shortness of breath.     . Alcohol Swabs (B-D SINGLE USE SWABS REGULAR) PADS     . Blood Glucose Monitoring Suppl (TRUE  METRIX METER) w/Device KIT Use to check blood sugar daily. 1 kit 0  . dabigatran (PRADAXA) 150 MG CAPS capsule Take 1 capsule (150 mg total) by mouth 2 (two) times daily. 60 capsule 2  . digoxin (LANOXIN) 0.125 MG tablet Take 125 mcg by mouth daily.    Marland Kitchen diltiazem (CARDIZEM) 60 MG tablet Take 1 tablet (60 mg total) by mouth every 8 (eight) hours. (Patient taking differently: Take 60 mg by mouth 3 (three) times daily. ) 90 tablet 3  . Dulaglutide (TRULICITY) 1.5 NW/2.9FA SOPN Inject 1.5 mg into the skin once a week. 12 pen 2  . DULoxetine (CYMBALTA) 60 MG capsule Take 1 capsule (60 mg total) by mouth daily. 30 capsule 3  . furosemide (LASIX) 40 MG tablet Take 1 tablet (40 mg total) by mouth daily. (Patient taking differently: Take 40 mg by mouth 2 (two) times daily. ) 60 tablet 11  . glucose blood (TRUE METRIX BLOOD GLUCOSE TEST) test strip Use as instructed to check blood sugar daily. 100 each 11  . Insulin Syringe-Needle U-100 (TRUEPLUS INSULIN SYRINGE) 31G X 5/16" 0.3 ML MISC Use to administer insulin every day. 100 each 11  . LEVEMIR 100 UNIT/ML injection INJECT 0.4 MLS (40 UNITS TOTAL) INTO THE SKIN 2 (TWO) TIMES DAILY. (Patient taking differently: Inject 50 Units into the skin 2 (two) times daily. ) 60 mL 1  . metolazone (ZAROXOLYN) 5 MG tablet Take 5 mg by mouth daily.    . metoprolol tartrate (LOPRESSOR) 25 MG tablet Take 1 tablet (25 mg total) by mouth 2 (two) times daily. 60 tablet 0  . NOVOLOG 100 UNIT/ML injection INJECT 8 UNITS INTO THE SKIN 3 (THREE) TIMES DAILY WITH MEALS. TAKE ONLY WHEN YOUR EAT MORE THAN 50% OF YOUR MEALS (Patient taking differently: Inject 5 Units into the skin 3 (three) times daily with meals. ) 30 mL 0  . pantoprazole (PROTONIX) 40 MG tablet Take 1 tablet (40 mg total) by mouth 2 (two) times daily. 60 tablet 0  . potassium chloride (MICRO-K) 10 MEQ CR capsule Take 1 capsule (10 mEq total) by mouth daily. 90 capsule 1  . RESTASIS 0.05 % ophthalmic emulsion Place 1  drop into both eyes 2 (two) times daily.  12  . rosuvastatin (CRESTOR) 10 MG tablet Take 10 mg by mouth daily.    Marland Kitchen gabapentin (NEURONTIN) 300 MG capsule Take 300 mg by mouth 3 (three) times daily.     . Multiple Vitamins-Minerals (MULTIVITAMIN WOMEN PO) Take 1 tablet by mouth daily. (Patient  not taking: Reported on 02/24/2020)    . nitroGLYCERIN (NITROSTAT) 0.4 MG SL tablet Place 1 tablet (0.4 mg total) under the tongue every 5 (five) minutes x 3 doses as needed for chest pain. (Patient not taking: Reported on 02/24/2020) 25 tablet 12  . sucralfate (CARAFATE) 1 g tablet Take 1 tablet (1 g total) by mouth 4 (four) times daily as needed. 30 tablet 0  . Melatonin 5 MG CAPS Take 1 capsule (5 mg total) by mouth at bedtime as needed. (Patient not taking: Reported on 01/30/2020) 30 capsule 3  . ondansetron (ZOFRAN) 4 MG tablet Take 1 tablet (4 mg total) by mouth every 8 (eight) hours as needed for nausea or vomiting. 12 tablet 0  . oxyCODONE (ROXICODONE) 5 MG immediate release tablet Take 1 tablet (5 mg total) by mouth every 8 (eight) hours as needed. (Patient not taking: Reported on 01/15/2020) 20 tablet 0  . potassium chloride SA (KLOR-CON) 20 MEQ tablet Take 1 tablet (20 mEq total) by mouth daily. (Patient not taking: Reported on 01/30/2020) 90 tablet 1  . tiZANidine (ZANAFLEX) 4 MG tablet Take 1 tablet (4 mg total) by mouth every 6 (six) hours as needed for muscle spasms. (Patient not taking: Reported on 01/30/2020) 90 tablet 1  . traMADol (ULTRAM) 50 MG tablet Take 1 tablet (50 mg total) by mouth every 6 (six) hours as needed. (Patient not taking: Reported on 01/30/2020) 6 tablet 0   No facility-administered medications prior to visit.    Allergies  Allergen Reactions  . Sulfa Antibiotics Itching    ROS Review of Systems  Musculoskeletal:       Unable to raise arm > 35 degree with out pain and shaking      Objective:    Physical Exam Vitals reviewed.  Constitutional:      Appearance: She is  obese.  HENT:     Head: Normocephalic.  Cardiovascular:     Rate and Rhythm: Rhythm irregular.     Comments: regular Pulmonary:     Effort: Pulmonary effort is normal.     Breath sounds: Normal breath sounds.  Abdominal:     General: Bowel sounds are normal.  Musculoskeletal:        General: Normal range of motion.     Cervical back: Normal range of motion.  Neurological:     Mental Status: She is oriented to person, place, and time.  Psychiatric:        Mood and Affect: Mood normal.        Behavior: Behavior normal.        Thought Content: Thought content normal.        Judgment: Judgment normal.     BP 126/73 (BP Location: Right Arm, Patient Position: Sitting, Cuff Size: Large)   Pulse 66   Temp 98 F (36.7 C) (Oral)   Ht '5\' 7"'$  (1.702 m)   Wt 270 lb (122.5 kg)   SpO2 93%   BMI 42.29 kg/m  Wt Readings from Last 3 Encounters:  02/24/20 270 lb (122.5 kg)  01/15/20 275 lb (124.7 kg)  12/23/19 284 lb 6.4 oz (129 kg)     Health Maintenance Due  Topic Date Due  . OPHTHALMOLOGY EXAM  Never done  . PAP SMEAR-Modifier  Never done  . MAMMOGRAM  Never done  . URINE MICROALBUMIN  03/03/2020    There are no preventive care reminders to display for this patient.  Lab Results  Component Value Date   TSH 2.820 12/23/2019  Lab Results  Component Value Date   WBC 8.1 01/30/2020   HGB 15.3 (H) 01/30/2020   HCT 46.0 01/30/2020   MCV 87.8 01/30/2020   PLT 252 01/30/2020   Lab Results  Component Value Date   NA 139 01/30/2020   K 3.8 01/30/2020   CO2 27 01/30/2020   GLUCOSE 203 (H) 01/30/2020   BUN 13 01/30/2020   CREATININE 1.61 (H) 01/30/2020   BILITOT 0.4 12/23/2019   ALKPHOS 100 12/23/2019   AST 16 12/23/2019   ALT 8 12/23/2019   PROT 7.8 12/23/2019   ALBUMIN 3.9 12/23/2019   CALCIUM 9.4 01/30/2020   ANIONGAP 14 01/30/2020   Lab Results  Component Value Date   CHOL 234 (H) 09/04/2019   Lab Results  Component Value Date   HDL 47 09/04/2019   Lab  Results  Component Value Date   LDLCALC 161 (H) 09/04/2019   Lab Results  Component Value Date   TRIG 143 09/04/2019   Lab Results  Component Value Date   CHOLHDL 5.0 (H) 09/04/2019   Lab Results  Component Value Date   HGBA1C 10.0 (A) 12/23/2019   1 month   Assessment & Plan:  [name] was seen today for follow-up.  Diagnoses and all orders for this visit:  Type 2 diabetes mellitus without complication, without long-term current use of insulin (HCC) ADA recommends the following therapeutic goals for glycemic control related to A1c measurements:Foods that are high in carbohydrates are the following rice, potatoes, breads, sugars, and pastas.  Reduction in the intake (eating) will assist in lowering your blood sugars. -     Glucose (CBG), Fasting -     Microalbumin, urine  Breast cancer screening by mammogram -     MM Digital Screening; Future  Chronic atrial fibrillation ([place]) -     Ambulatory referral to Cardiology  Diastolic dysfunction -     Ambulatory referral to Cardiology  Acute pain of left shoulder -     traMADol (ULTRAM) 50 MG tablet; Take 1 tablet (50 mg total) by mouth every 8 (eight) hours as needed.     Meds ordered this encounter  Medications  . traMADol (ULTRAM) 50 MG tablet    Sig: Take 1 tablet (50 mg total) by mouth every 8 (eight) hours as needed.    Dispense:  60 tablet    Refill:  0    Follow-up: Return in about 29 days (around 03/24/2020) for IN PERSON- DM/FASTING LABS.    [name], NP

## 2020-02-24 NOTE — Patient Instructions (Signed)
Diabetes Mellitus and Nutrition, Adult When you have diabetes (diabetes mellitus), it is very important to have healthy eating habits because your blood sugar (glucose) levels are greatly affected by what you eat and drink. Eating healthy foods in the appropriate amounts, at about the same times every day, can help you:  Control your blood glucose.  Lower your risk of heart disease.  Improve your blood pressure.  Reach or maintain a healthy weight. Every person with diabetes is different, and each person has different needs for a meal plan. Your health care provider may recommend that you work with a diet and nutrition specialist (dietitian) to make a meal plan that is best for you. Your meal plan may vary depending on factors such as:  The calories you need.  The medicines you take.  Your weight.  Your blood glucose, blood pressure, and cholesterol levels.  Your activity level.  Other health conditions you have, such as heart or kidney disease. How do carbohydrates affect me? Carbohydrates, also called carbs, affect your blood glucose level more than any other type of food. Eating carbs naturally raises the amount of glucose in your blood. Carb counting is a method for keeping track of how many carbs you eat. Counting carbs is important to keep your blood glucose at a healthy level, especially if you use insulin or take certain oral diabetes medicines. It is important to know how many carbs you can safely have in each meal. This is different for every person. Your dietitian can help you calculate how many carbs you should have at each meal and for each snack. Foods that contain carbs include:  Bread, cereal, rice, pasta, and crackers.  Potatoes and corn.  Peas, beans, and lentils.  Milk and yogurt.  Fruit and juice.  Desserts, such as cakes, cookies, ice cream, and candy. How does alcohol affect me? Alcohol can cause a sudden decrease in blood glucose (hypoglycemia),  especially if you use insulin or take certain oral diabetes medicines. Hypoglycemia can be a life-threatening condition. Symptoms of hypoglycemia (sleepiness, dizziness, and confusion) are similar to symptoms of having too much alcohol. If your health care provider says that alcohol is safe for you, follow these guidelines:  Limit alcohol intake to no more than 1 drink per day for nonpregnant women and 2 drinks per day for men. One drink equals 12 oz of beer, 5 oz of wine, or 1 oz of hard liquor.  Do not drink on an empty stomach.  Keep yourself hydrated with water, diet soda, or unsweetened iced tea.  Keep in mind that regular soda, juice, and other mixers may contain a lot of sugar and must be counted as carbs. What are tips for following this plan?  Reading food labels  Start by checking the serving size on the "Nutrition Facts" label of packaged foods and drinks. The amount of calories, carbs, fats, and other nutrients listed on the label is based on one serving of the item. Many items contain more than one serving per package.  Check the total grams (g) of carbs in one serving. You can calculate the number of servings of carbs in one serving by dividing the total carbs by 15. For example, if a food has 30 g of total carbs, it would be equal to 2 servings of carbs.  Check the number of grams (g) of saturated and trans fats in one serving. Choose foods that have low or no amount of these fats.  Check the number of   milligrams (mg) of salt (sodium) in one serving. Most people should limit total sodium intake to less than 2,300 mg per day.  Always check the nutrition information of foods labeled as "low-fat" or "nonfat". These foods may be higher in added sugar or refined carbs and should be avoided.  Talk to your dietitian to identify your daily goals for nutrients listed on the label. Shopping  Avoid buying canned, premade, or processed foods. These foods tend to be high in fat, sodium,  and added sugar.  Shop around the outside edge of the grocery store. This includes fresh fruits and vegetables, bulk grains, fresh meats, and fresh dairy. Cooking  Use low-heat cooking methods, such as baking, instead of high-heat cooking methods like deep frying.  Cook using healthy oils, such as olive, canola, or sunflower oil.  Avoid cooking with butter, cream, or high-fat meats. Meal planning  Eat meals and snacks regularly, preferably at the same times every day. Avoid going long periods of time without eating.  Eat foods high in fiber, such as fresh fruits, vegetables, beans, and whole grains. Talk to your dietitian about how many servings of carbs you can eat at each meal.  Eat 4-6 ounces (oz) of lean protein each day, such as lean meat, chicken, fish, eggs, or tofu. One oz of lean protein is equal to: ? 1 oz of meat, chicken, or fish. ? 1 egg. ?  cup of tofu.  Eat some foods each day that contain healthy fats, such as avocado, nuts, seeds, and fish. Lifestyle  Check your blood glucose regularly.  Exercise regularly as told by your health care provider. This may include: ? 150 minutes of moderate-intensity or vigorous-intensity exercise each week. This could be brisk walking, biking, or water aerobics. ? Stretching and doing strength exercises, such as yoga or weightlifting, at least 2 times a week.  Take medicines as told by your health care provider.  Do not use any products that contain nicotine or tobacco, such as cigarettes and e-cigarettes. If you need help quitting, ask your health care provider.  Work with a counselor or diabetes educator to identify strategies to manage stress and any emotional and social challenges. Questions to ask a health care provider  Do I need to meet with a diabetes educator?  Do I need to meet with a dietitian?  What number can I call if I have questions?  When are the best times to check my blood glucose? Where to find more  information:  American Diabetes Association: diabetes.org  Academy of Nutrition and Dietetics: www.eatright.org  National Institute of Diabetes and Digestive and Kidney Diseases (NIH): www.niddk.nih.gov Summary  A healthy meal plan will help you control your blood glucose and maintain a healthy lifestyle.  Working with a diet and nutrition specialist (dietitian) can help you make a meal plan that is best for you.  Keep in mind that carbohydrates (carbs) and alcohol have immediate effects on your blood glucose levels. It is important to count carbs and to use alcohol carefully. This information is not intended to replace advice given to you by your health care provider. Make sure you discuss any questions you have with your health care provider. Document Revised: 07/20/2017 Document Reviewed: 09/11/2016 Elsevier Patient Education  2020 Elsevier Inc.  

## 2020-02-25 LAB — MICROALBUMIN, URINE: Microalbumin, Urine: 780.3 ug/mL

## 2020-03-02 ENCOUNTER — Observation Stay (HOSPITAL_COMMUNITY): Payer: Medicare HMO

## 2020-03-02 ENCOUNTER — Inpatient Hospital Stay (HOSPITAL_COMMUNITY)
Admission: EM | Admit: 2020-03-02 | Discharge: 2020-03-08 | DRG: 074 | Disposition: A | Discharge status: Home Health | Payer: Medicare HMO | Attending: Internal Medicine | Admitting: Internal Medicine

## 2020-03-02 ENCOUNTER — Emergency Department (HOSPITAL_COMMUNITY): Payer: Medicare HMO

## 2020-03-02 ENCOUNTER — Other Ambulatory Visit: Payer: Self-pay

## 2020-03-02 ENCOUNTER — Telehealth: Payer: Self-pay | Admitting: Primary Care

## 2020-03-02 DIAGNOSIS — R079 Chest pain, unspecified: Secondary | ICD-10-CM | POA: Diagnosis not present

## 2020-03-02 DIAGNOSIS — N1832 Chronic kidney disease, stage 3b: Secondary | ICD-10-CM | POA: Diagnosis not present

## 2020-03-02 DIAGNOSIS — E1122 Type 2 diabetes mellitus with diabetic chronic kidney disease: Secondary | ICD-10-CM | POA: Diagnosis present

## 2020-03-02 DIAGNOSIS — E669 Obesity, unspecified: Secondary | ICD-10-CM | POA: Diagnosis present

## 2020-03-02 DIAGNOSIS — D649 Anemia, unspecified: Secondary | ICD-10-CM | POA: Diagnosis not present

## 2020-03-02 DIAGNOSIS — E871 Hypo-osmolality and hyponatremia: Secondary | ICD-10-CM | POA: Diagnosis not present

## 2020-03-02 DIAGNOSIS — K529 Noninfective gastroenteritis and colitis, unspecified: Secondary | ICD-10-CM | POA: Diagnosis present

## 2020-03-02 DIAGNOSIS — G4733 Obstructive sleep apnea (adult) (pediatric): Secondary | ICD-10-CM | POA: Diagnosis not present

## 2020-03-02 DIAGNOSIS — R1033 Periumbilical pain: Secondary | ICD-10-CM

## 2020-03-02 DIAGNOSIS — E875 Hyperkalemia: Secondary | ICD-10-CM | POA: Diagnosis not present

## 2020-03-02 DIAGNOSIS — Z20822 Contact with and (suspected) exposure to covid-19: Secondary | ICD-10-CM | POA: Diagnosis present

## 2020-03-02 DIAGNOSIS — K3184 Gastroparesis: Secondary | ICD-10-CM | POA: Diagnosis not present

## 2020-03-02 DIAGNOSIS — Z8701 Personal history of pneumonia (recurrent): Secondary | ICD-10-CM

## 2020-03-02 DIAGNOSIS — R42 Dizziness and giddiness: Secondary | ICD-10-CM | POA: Diagnosis not present

## 2020-03-02 DIAGNOSIS — Z794 Long term (current) use of insulin: Secondary | ICD-10-CM

## 2020-03-02 DIAGNOSIS — E785 Hyperlipidemia, unspecified: Secondary | ICD-10-CM | POA: Diagnosis present

## 2020-03-02 DIAGNOSIS — F329 Major depressive disorder, single episode, unspecified: Secondary | ICD-10-CM | POA: Diagnosis present

## 2020-03-02 DIAGNOSIS — I5031 Acute diastolic (congestive) heart failure: Secondary | ICD-10-CM | POA: Diagnosis not present

## 2020-03-02 DIAGNOSIS — R197 Diarrhea, unspecified: Secondary | ICD-10-CM | POA: Diagnosis present

## 2020-03-02 DIAGNOSIS — Z79899 Other long term (current) drug therapy: Secondary | ICD-10-CM

## 2020-03-02 DIAGNOSIS — I509 Heart failure, unspecified: Secondary | ICD-10-CM | POA: Diagnosis present

## 2020-03-02 DIAGNOSIS — M1990 Unspecified osteoarthritis, unspecified site: Secondary | ICD-10-CM | POA: Diagnosis present

## 2020-03-02 DIAGNOSIS — I5033 Acute on chronic diastolic (congestive) heart failure: Secondary | ICD-10-CM | POA: Diagnosis present

## 2020-03-02 DIAGNOSIS — Z882 Allergy status to sulfonamides status: Secondary | ICD-10-CM

## 2020-03-02 DIAGNOSIS — I5032 Chronic diastolic (congestive) heart failure: Secondary | ICD-10-CM | POA: Diagnosis present

## 2020-03-02 DIAGNOSIS — R112 Nausea with vomiting, unspecified: Secondary | ICD-10-CM | POA: Diagnosis present

## 2020-03-02 DIAGNOSIS — Z6841 Body Mass Index (BMI) 40.0 and over, adult: Secondary | ICD-10-CM | POA: Diagnosis not present

## 2020-03-02 DIAGNOSIS — I130 Hypertensive heart and chronic kidney disease with heart failure and stage 1 through stage 4 chronic kidney disease, or unspecified chronic kidney disease: Secondary | ICD-10-CM | POA: Diagnosis present

## 2020-03-02 DIAGNOSIS — I480 Paroxysmal atrial fibrillation: Secondary | ICD-10-CM | POA: Diagnosis present

## 2020-03-02 DIAGNOSIS — I4891 Unspecified atrial fibrillation: Secondary | ICD-10-CM | POA: Diagnosis present

## 2020-03-02 DIAGNOSIS — E860 Dehydration: Secondary | ICD-10-CM | POA: Diagnosis not present

## 2020-03-02 DIAGNOSIS — J449 Chronic obstructive pulmonary disease, unspecified: Secondary | ICD-10-CM | POA: Diagnosis present

## 2020-03-02 DIAGNOSIS — K219 Gastro-esophageal reflux disease without esophagitis: Secondary | ICD-10-CM | POA: Diagnosis present

## 2020-03-02 DIAGNOSIS — I10 Essential (primary) hypertension: Secondary | ICD-10-CM | POA: Diagnosis not present

## 2020-03-02 DIAGNOSIS — E876 Hypokalemia: Secondary | ICD-10-CM | POA: Diagnosis present

## 2020-03-02 DIAGNOSIS — R000 Tachycardia, unspecified: Secondary | ICD-10-CM | POA: Diagnosis not present

## 2020-03-02 DIAGNOSIS — E1165 Type 2 diabetes mellitus with hyperglycemia: Secondary | ICD-10-CM | POA: Diagnosis present

## 2020-03-02 DIAGNOSIS — R109 Unspecified abdominal pain: Secondary | ICD-10-CM | POA: Diagnosis not present

## 2020-03-02 DIAGNOSIS — E1143 Type 2 diabetes mellitus with diabetic autonomic (poly)neuropathy: Secondary | ICD-10-CM | POA: Diagnosis not present

## 2020-03-02 DIAGNOSIS — Z8249 Family history of ischemic heart disease and other diseases of the circulatory system: Secondary | ICD-10-CM

## 2020-03-02 DIAGNOSIS — Z7901 Long term (current) use of anticoagulants: Secondary | ICD-10-CM

## 2020-03-02 DIAGNOSIS — E1169 Type 2 diabetes mellitus with other specified complication: Secondary | ICD-10-CM | POA: Diagnosis present

## 2020-03-02 DIAGNOSIS — I517 Cardiomegaly: Secondary | ICD-10-CM | POA: Diagnosis not present

## 2020-03-02 DIAGNOSIS — J309 Allergic rhinitis, unspecified: Secondary | ICD-10-CM | POA: Diagnosis present

## 2020-03-02 LAB — COMPREHENSIVE METABOLIC PANEL
ALT: 15 U/L (ref 0–44)
AST: 20 U/L (ref 15–41)
Albumin: 3.2 g/dL — ABNORMAL LOW (ref 3.5–5.0)
Alkaline Phosphatase: 81 U/L (ref 38–126)
Anion gap: 15 (ref 5–15)
BUN: 15 mg/dL (ref 8–23)
CO2: 31 mmol/L (ref 22–32)
Calcium: 9.1 mg/dL (ref 8.9–10.3)
Chloride: 90 mmol/L — ABNORMAL LOW (ref 98–111)
Creatinine, Ser: 1.74 mg/dL — ABNORMAL HIGH (ref 0.44–1.00)
GFR calc Af Amer: 36 mL/min — ABNORMAL LOW (ref >60)
GFR calc non Af Amer: 31 mL/min — ABNORMAL LOW (ref >60)
Glucose, Bld: 259 mg/dL — ABNORMAL HIGH (ref 70–99)
Potassium: 2.8 mmol/L — ABNORMAL LOW (ref 3.5–5.1)
Sodium: 136 mmol/L (ref 135–145)
Total Bilirubin: 0.8 mg/dL (ref 0.3–1.2)
Total Protein: 7.7 g/dL (ref 6.5–8.1)

## 2020-03-02 LAB — CBC WITH DIFFERENTIAL/PLATELET
Abs Immature Granulocytes: 0.03 10*3/uL (ref 0.00–0.07)
Basophils Absolute: 0 10*3/uL (ref 0.0–0.1)
Basophils Relative: 0 %
Eosinophils Absolute: 0 10*3/uL (ref 0.0–0.5)
Eosinophils Relative: 0 %
HCT: 41.6 % (ref 36.0–46.0)
Hemoglobin: 14.3 g/dL (ref 12.0–15.0)
Immature Granulocytes: 0 %
Lymphocytes Relative: 20 %
Lymphs Abs: 2.1 10*3/uL (ref 0.7–4.0)
MCH: 30 pg (ref 26.0–34.0)
MCHC: 34.4 g/dL (ref 30.0–36.0)
MCV: 87.2 fL (ref 80.0–100.0)
Monocytes Absolute: 0.9 10*3/uL (ref 0.1–1.0)
Monocytes Relative: 8 %
Neutro Abs: 7.3 10*3/uL (ref 1.7–7.7)
Neutrophils Relative %: 72 %
Platelets: 259 10*3/uL (ref 150–400)
RBC: 4.77 MIL/uL (ref 3.87–5.11)
RDW: 13.1 % (ref 11.5–15.5)
WBC: 10.3 10*3/uL (ref 4.0–10.5)
nRBC: 0 % (ref 0.0–0.2)

## 2020-03-02 LAB — BRAIN NATRIURETIC PEPTIDE: B Natriuretic Peptide: 43.7 pg/mL (ref 0.0–100.0)

## 2020-03-02 LAB — PROTIME-INR
INR: 1.5 — ABNORMAL HIGH (ref 0.8–1.2)
Prothrombin Time: 17.6 seconds — ABNORMAL HIGH (ref 11.4–15.2)

## 2020-03-02 LAB — TROPONIN I (HIGH SENSITIVITY)
Troponin I (High Sensitivity): 8 ng/L (ref <18)
Troponin I (High Sensitivity): 7 ng/L (ref <18)

## 2020-03-02 LAB — LIPASE, BLOOD: Lipase: 15 U/L (ref 11–51)

## 2020-03-02 LAB — SARS CORONAVIRUS 2 BY RT PCR (HOSPITAL ORDER, PERFORMED IN ~~LOC~~ HOSPITAL LAB): SARS Coronavirus 2: NEGATIVE

## 2020-03-02 MED ORDER — ONDANSETRON HCL 4 MG PO TABS
4.0000 mg | ORAL_TABLET | Freq: Four times a day (QID) | ORAL | Status: DC | PRN
  Administered 2020-03-04 (×2): 4 mg via ORAL
  Filled 2020-03-02 (×2): qty 1

## 2020-03-02 MED ORDER — ONDANSETRON HCL 4 MG/2ML IJ SOLN
4.0000 mg | Freq: Four times a day (QID) | INTRAMUSCULAR | Status: DC | PRN
  Administered 2020-03-02 – 2020-03-05 (×4): 4 mg via INTRAVENOUS
  Filled 2020-03-02 (×4): qty 2

## 2020-03-02 MED ORDER — MORPHINE SULFATE (PF) 4 MG/ML IV SOLN
4.0000 mg | Freq: Once | INTRAVENOUS | Status: AC
  Administered 2020-03-02: 4 mg via INTRAVENOUS
  Filled 2020-03-02: qty 1

## 2020-03-02 MED ORDER — METOPROLOL TARTRATE 25 MG PO TABS
25.0000 mg | ORAL_TABLET | Freq: Two times a day (BID) | ORAL | Status: DC
  Administered 2020-03-02 – 2020-03-08 (×12): 25 mg via ORAL
  Filled 2020-03-02 (×12): qty 1

## 2020-03-02 MED ORDER — DILTIAZEM HCL 60 MG PO TABS
60.0000 mg | ORAL_TABLET | Freq: Three times a day (TID) | ORAL | Status: DC
  Administered 2020-03-02 – 2020-03-07 (×14): 60 mg via ORAL
  Filled 2020-03-02 (×16): qty 1

## 2020-03-02 MED ORDER — ONDANSETRON HCL 4 MG/2ML IJ SOLN
4.0000 mg | Freq: Once | INTRAMUSCULAR | Status: AC
  Administered 2020-03-02: 4 mg via INTRAVENOUS
  Filled 2020-03-02: qty 2

## 2020-03-02 MED ORDER — SODIUM CHLORIDE 0.9 % IV BOLUS
500.0000 mL | Freq: Once | INTRAVENOUS | Status: AC
  Administered 2020-03-02: 500 mL via INTRAVENOUS

## 2020-03-02 MED ORDER — POTASSIUM CHLORIDE 10 MEQ/100ML IV SOLN
10.0000 meq | INTRAVENOUS | Status: AC
  Administered 2020-03-02 (×2): 10 meq via INTRAVENOUS
  Filled 2020-03-02 (×2): qty 100

## 2020-03-02 MED ORDER — PANTOPRAZOLE SODIUM 40 MG PO TBEC
40.0000 mg | DELAYED_RELEASE_TABLET | Freq: Two times a day (BID) | ORAL | Status: DC
  Administered 2020-03-02 – 2020-03-08 (×12): 40 mg via ORAL
  Filled 2020-03-02 (×12): qty 1

## 2020-03-02 MED ORDER — POTASSIUM CHLORIDE CRYS ER 20 MEQ PO TBCR
40.0000 meq | EXTENDED_RELEASE_TABLET | ORAL | Status: AC
  Administered 2020-03-02: 40 meq via ORAL
  Filled 2020-03-02: qty 2

## 2020-03-02 MED ORDER — ROSUVASTATIN CALCIUM 5 MG PO TABS
10.0000 mg | ORAL_TABLET | Freq: Every day | ORAL | Status: DC
  Administered 2020-03-02 – 2020-03-08 (×7): 10 mg via ORAL
  Filled 2020-03-02 (×7): qty 2

## 2020-03-02 NOTE — Progress Notes (Signed)
VAST consulted to obtain IV access. Upon arrival at bedside, IV in place in pt's hand. No further IV access needed at this time.

## 2020-03-02 NOTE — Telephone Encounter (Signed)
Patient is currently at the Emergency Department.

## 2020-03-02 NOTE — Telephone Encounter (Signed)
Please advise   Copied from [place] [phone]. Topic: Appointment Scheduling - Scheduling Inquiry for Clinic >> Mar 02, 2020 10:24 AM Rainey Pines A wrote: Patient has possible food poisoning and wants to know if there is anyway she can be worked in within the next 2 days or if her PCP would advise her to take anything to assist  with her stomach.

## 2020-03-02 NOTE — ED Notes (Signed)
Patient transported to X-ray 

## 2020-03-02 NOTE — ED Notes (Signed)
Pt actively vomiting at this time.

## 2020-03-02 NOTE — ED Triage Notes (Signed)
64 yo F , from home. Pt reports N/V/D . No meds or food since Friday. Pt has DM and CHF no meds since Friday. Pt report feeling dizzy. BP 153/93, CBG 304, SPO@ 100 RA, HX A-fib rate of 90.

## 2020-03-02 NOTE — ED Provider Notes (Signed)
[facility] EMERGENCY DEPARTMENT Provider Note   CSN: [id number] Arrival date & time: 03/02/20  1424     History Chief Complaint  Patient presents with  . Emesis  . Nausea    [name] is a 64 y.o. female.  64 y.o female with an extensive PMH of COPD, CHF, HTN presents to the ED with a chief complaint of nausea and vomiting x 4 days. Patient endorses stabbing constant pain along the periumbilical area, no alleviating or exacerbating factors. She endorses multiple episodes of non bilious, non bloody emesis. She has been unable to take any of her medication, including her insulin along with her diuretics.  Patient was recently seen in the ED approximately a month ago, had a negative work-up and was able to go home.  She reports after arriving home her symptoms shortly returned.  She feels overall weak, fatigued, has not been taking her medication. She denies any fever, shortness of breath or chest pain.   The history is provided by the patient.       Past Medical History:  Diagnosis Date  . Allergic rhinitis   . Arthritis   . Asthma   . Chest pain 12/27/2017  . Chronic diastolic CHF (congestive heart failure) ([place])   . COPD (chronic obstructive pulmonary disease) ([place])   . Depression   . DM (diabetes mellitus) ([place])   . DVT (deep vein thrombosis) in pregnancy   . Dysrhythmia    atrial fibrilation  . GERD (gastroesophageal reflux disease)   . Headache(784.0)   . HTN (hypertension)   . Hyperlipidemia   . Obesity   . Pneumonia 04/2018   RIGHT LOBE  . Shortness of breath   . Sleep apnea    compliant with CPAP    Patient Active Problem List   Diagnosis Date Noted  . Closed right ankle fracture 09/24/2019  . DKA, type 2 ([place]) 09/24/2019  . HHNC (hyperglycemic hyperosmolar nonketotic coma) ([place]) 02/21/2019  . Hypokalemia 02/21/2019  . Acute kidney injury superimposed on CKD ([place]) 02/21/2019  . Acute on chronic left systolic heart failure  ([place]) 07/01/2018  . Congestive heart failure with left ventricular diastolic dysfunction, acute ([place]) 07/01/2018  . Right upper lobe pneumonia 05/14/2018  . Atrial fibrillation with RVR ([place]) 05/14/2018  . Chronic atrial fibrillation ([place]) 12/26/2017  . Diastolic dysfunction 95/62/1308  . Diabetes mellitus type 2 in obese ([place]) 12/26/2017  . Nausea vomiting and diarrhea   . Chest pain 02/26/2012  . OSA (obstructive sleep apnea) 09/21/2011  . Hypoxia 09/21/2011  . Diabetes mellitus ([place]) 02/04/2010  . Morbid obesity ([place]) 02/04/2010  . Atrial fibrillation ([place]) 02/04/2010  . Gastroparesis 02/04/2010  . GASTROENTERITIS 02/04/2010    Past Surgical History:  Procedure Laterality Date  . ABDOMINAL HYSTERECTOMY    . CATARACT EXTRACTION, BILATERAL    . CHOLECYSTECTOMY    . COLONOSCOPY WITH PROPOFOL N/A 03/05/2015   Procedure: COLONOSCOPY WITH PROPOFOL;  Surgeon: Carol Ada, MD;  Location: [facility];  Service: Endoscopy;  Laterality: N/A;  . DIAGNOSTIC LAPAROSCOPY    . ingrown hallux Left   . KNEE SURGERY    . LEFT HEART CATH AND CORONARY ANGIOGRAPHY N/A 12/31/2017   Procedure: LEFT HEART CATH AND CORONARY ANGIOGRAPHY;  Surgeon: Charolette Forward, MD;  Location: [facility];  Service: Cardiovascular;  Laterality: N/A;     OB History   No obstetric history on file.     Family History  Problem Relation Age of Onset  . Cancer  Mother   . Hypertension Mother   . Cancer Father   . CAD Other   . Hypertension Sister   . Hypertension Brother     Social History   Tobacco Use  . Smoking status: Never Smoker  . Smokeless tobacco: Never Used  Vaping Use  . Vaping Use: Never used  Substance Use Topics  . Alcohol use: No  . Drug use: No    Home Medications Prior to Admission medications   Medication Sig Start Date End Date Taking? Authorizing Provider  Accu-Chek FastClix Lancets MISC Use as directed three times daily 03/10/19   [name], NP  albuterol  (VENTOLIN HFA) 108 (90 Base) MCG/ACT inhaler Inhale 2 puffs into the lungs every 4 (four) hours as needed for wheezing or shortness of breath.  08/15/19   [provider]  Alcohol Swabs (B-D SINGLE USE SWABS REGULAR) PADS  02/19/19   [provider]  Blood Glucose Monitoring Suppl (TRUE METRIX METER) w/Device KIT Use to check blood sugar daily. 03/10/19   Charlott Rakes, MD  dabigatran (PRADAXA) 150 MG CAPS capsule Take 1 capsule (150 mg total) by mouth 2 (two) times daily. 09/26/19   Georgette Shell, MD  digoxin (LANOXIN) 0.125 MG tablet Take 125 mcg by mouth daily. 11/04/19   [provider]  diltiazem (CARDIZEM) 60 MG tablet Take 1 tablet (60 mg total) by mouth every 8 (eight) hours. Patient taking differently: Take 60 mg by mouth 3 (three) times daily.  09/26/19   Georgette Shell, MD  Dulaglutide (TRULICITY) 1.5 IR/5.1OA SOPN Inject 1.5 mg into the skin once a week. 12/23/19   Scot Jun, FNP  DULoxetine (CYMBALTA) 60 MG capsule Take 1 capsule (60 mg total) by mouth daily. 05/20/18   Charolette Forward, MD  furosemide (LASIX) 40 MG tablet Take 1 tablet (40 mg total) by mouth daily. Patient taking differently: Take 40 mg by mouth 2 (two) times daily.  02/24/19 02/24/20  Shelly Coss, MD  glucose blood (TRUE METRIX BLOOD GLUCOSE TEST) test strip Use as instructed to check blood sugar daily. 03/10/19   Charlott Rakes, MD  Insulin Syringe-Needle U-100 (TRUEPLUS INSULIN SYRINGE) 31G X 5/16" 0.3 ML MISC Use to administer insulin every day. 03/24/19   Newlin, Charlane Ferretti, MD  LEVEMIR 100 UNIT/ML injection INJECT 0.4 MLS (40 UNITS TOTAL) INTO THE SKIN 2 (TWO) TIMES DAILY. Patient taking differently: Inject 50 Units into the skin 2 (two) times daily.  08/07/19   [name], NP  metolazone (ZAROXOLYN) 5 MG tablet Take 5 mg by mouth daily. 06/01/19   [provider]  metoprolol tartrate (LOPRESSOR) 25 MG tablet Take 1 tablet (25 mg total) by mouth 2 (two) times  daily. 02/24/19   Shelly Coss, MD  Multiple Vitamins-Minerals (MULTIVITAMIN WOMEN PO) Take 1 tablet by mouth daily. Patient not taking: Reported on 02/24/2020    [provider]  nitroGLYCERIN (NITROSTAT) 0.4 MG SL tablet Place 1 tablet (0.4 mg total) under the tongue every 5 (five) minutes x 3 doses as needed for chest pain. Patient not taking: Reported on 02/24/2020 01/08/14   Charolette Forward, MD  NOVOLOG 100 UNIT/ML injection INJECT 8 UNITS INTO THE SKIN 3 (THREE) TIMES DAILY WITH MEALS. TAKE ONLY WHEN YOUR EAT MORE THAN 50% OF YOUR MEALS Patient taking differently: Inject 5 Units into the skin 3 (three) times daily with meals.  09/02/19   Charlott Rakes, MD  pantoprazole (PROTONIX) 40 MG tablet Take 1 tablet (40 mg total) by  mouth 2 (two) times daily. 01/01/18   Regalado, Belkys A, MD  potassium chloride (MICRO-K) 10 MEQ CR capsule Take 1 capsule (10 mEq total) by mouth daily. 12/25/19   Scot Jun, FNP  RESTASIS 0.05 % ophthalmic emulsion Place 1 drop into both eyes 2 (two) times daily. 11/19/17   [provider]  rosuvastatin (CRESTOR) 10 MG tablet Take 10 mg by mouth daily. 12/07/19   [provider]  sucralfate (CARAFATE) 1 g tablet Take 1 tablet (1 g total) by mouth 4 (four) times daily as needed. 01/30/20   Maudie Flakes, MD  traMADol (ULTRAM) 50 MG tablet Take 1 tablet (50 mg total) by mouth every 8 (eight) hours as needed. 02/24/20   [name], NP    Allergies    Sulfa antibiotics  Review of Systems   Review of Systems  Constitutional: Negative for chills and fever.  HENT: Negative for sore throat.   Respiratory: Negative for shortness of breath.   Cardiovascular: Positive for chest pain.  Gastrointestinal: Positive for abdominal pain, nausea and vomiting. Negative for diarrhea.  Genitourinary: Negative for flank pain.  Musculoskeletal: Negative for back pain.  Skin: Negative for pallor and wound.  Neurological: Negative for light-headedness  and headaches.  All other systems reviewed and are negative.   Physical Exam Updated Vital Signs BP (!) 147/71   Pulse (!) 113   Temp 98.4 F (36.9 C) (Oral)   Resp (!) 21   SpO2 99%   Physical Exam Vitals and nursing note reviewed.  Constitutional:      Appearance: Normal appearance. She is obese. She is ill-appearing. She is not toxic-appearing.  HENT:     Head: Normocephalic and atraumatic.     Nose: Nose normal.     Mouth/Throat:     Mouth: Mucous membranes are moist.  Eyes:     Pupils: Pupils are equal, round, and reactive to light.  Cardiovascular:     Rate and Rhythm: Rhythm irregular.     Pulses:          Dorsalis pedis pulses are 2+ on the right side and 2+ on the left side.     Comments: No BL pitting edema, no calf tenderness BL.  Pulmonary:     Effort: Pulmonary effort is normal.     Breath sounds: No wheezing or rales.  Abdominal:     General: Abdomen is flat. Bowel sounds are decreased.     Palpations: Abdomen is soft.     Tenderness: There is abdominal tenderness in the epigastric area and periumbilical area. There is no right CVA tenderness, left CVA tenderness, guarding or rebound. Negative signs include McBurney's sign.     Hernia: No hernia is present.     Comments: Bowel sounds are diminished throughout.   Musculoskeletal:     Cervical back: Normal range of motion and neck supple.     Right lower leg: No edema.     Left lower leg: No edema.  Skin:    General: Skin is warm and dry.  Neurological:     Mental Status: She is alert and oriented to person, place, and time.     ED Results / Procedures / Treatments   Labs (all labs ordered are listed, but only abnormal results are displayed) Labs Reviewed  COMPREHENSIVE METABOLIC PANEL - Abnormal; Notable for the following components:      Result Value   Potassium 2.8 (*)    Chloride 90 (*)    Glucose,  Bld 259 (*)    Creatinine, Ser 1.74 (*)    Albumin 3.2 (*)    GFR calc non Af Amer 31 (*)     GFR calc Af Amer 36 (*)    All other components within normal limits  PROTIME-INR - Abnormal; Notable for the following components:   Prothrombin Time 17.6 (*)    INR 1.5 (*)    All other components within normal limits  GASTROINTESTINAL PANEL BY PCR, STOOL (REPLACES STOOL CULTURE)  CBC WITH DIFFERENTIAL/PLATELET  LIPASE, BLOOD  BRAIN NATRIURETIC PEPTIDE  URINALYSIS, ROUTINE W REFLEX MICROSCOPIC  TROPONIN I (HIGH SENSITIVITY)  TROPONIN I (HIGH SENSITIVITY)    EKG None  Radiology DG Chest Portable 1 View  Result Date: 03/02/2020 CLINICAL DATA:  Chest pain EXAM: PORTABLE CHEST 1 VIEW COMPARISON:  02/29/2020 FINDINGS: Cardiomegaly. No consolidation or edema. No pleural effusion or pneumothorax. The visualized skeletal structures are unremarkable. IMPRESSION: No acute process in the chest.  Stable cardiomegaly. Electronically Signed   By: Macy Mis M.D.   On: 03/02/2020 14:58    Procedures Procedures (including critical care time)  Medications Ordered in ED Medications  potassium chloride 10 mEq in 100 mL IVPB (10 mEq Intravenous New Bag/Given 03/02/20 1737)  diltiazem (CARDIZEM) tablet 60 mg (60 mg Oral Given 03/02/20 1842)  metoprolol tartrate (LOPRESSOR) tablet 25 mg (has no administration in time range)  pantoprazole (PROTONIX) EC tablet 40 mg (has no administration in time range)  rosuvastatin (CRESTOR) tablet 10 mg (has no administration in time range)  sodium chloride 0.9 % bolus 500 mL (500 mLs Intravenous New Bag/Given 03/02/20 1558)  ondansetron (ZOFRAN) injection 4 mg (4 mg Intravenous Given 03/02/20 1559)  morphine 4 MG/ML injection 4 mg (4 mg Intravenous Given 03/02/20 1559)  morphine 4 MG/ML injection 4 mg (4 mg Intravenous Given 03/02/20 1735)    ED Course  I have reviewed the triage vital signs and the nursing notes.  Pertinent labs & imaging results that were available during my care of the patient were reviewed by me and considered in my medical decision making  (see chart for details).    MDM Rules/Calculators/A&P   Patient with an extensive PMH including CHF, DM, HTN presents to the ED via EMS for nausea/vomiting x 3 days. Reports symptoms began over the weekend, states she feels weak and dizzy. CBG on arrival at 304. Last urinary output sometime yesterday.   During primary evaluation patient appears uncomfortable HR around the 110, EKG obtained. Abdomen is mildly tender to palpation.  Bowel sounds are slightly decreased.  She reports she has not taken any of her medications in the past 3 days.  Does have a history of CHF, along with diabetes but has not been checking her sugars as reports she has not been taking any of her medication.  During arrival vitals remarkable for hypertension, tachypnea, satting at 100% on room air.  Lungs are clear to auscultation on my exam, no signs of volume overload, no pitting edema noted.  Xray of the chest without any signs of pulmonary edema, no consolidation or pneumothorax.   Last ECHO on file 07/02/2018: LV EF: 45% -  50%   Extensive review chart, patient has a similar visit with the same complaints on 01/30/2020 had a CT Abdomen and pelvis which did not show any acute process at the time. Will provide zofran, 527m bolus for symptomatic control.   Labs reviewed by me, CBC without any leukocytosis, hemoglobin is normal.  She does report some  streaks of blood with her vomit, no clots that she has noted.  She is currently on blood thinners, PT and INR are at her baseline.  CMP remarkable for hypokalemia, glucose is 259, left these are unremarkable, her kidney function is slightly elevated, provided with a 500 bolus.  BNP is within normal limits, no signs of volume overload on my exam.  Lipase level was within normal limits.  Troponins have remained flat and EKG remains unchanged, she is currently Having her potassium replaced via IV, reports no improvement of pain or nausea with medication. Will place call for  hospitalist further recommendation.   7:38 PM Spoke to hospitalist service who will admit patient for further management.  In addition, abdominal x-ray has been ordered, patient did have a normal CT about a month ago, hence I did not reorder this evaluation she does not have any peritoneal signs on my exam.   Portions of this note were generated with [employer] dictation software. Dictation errors may occur despite best attempts at proofreading.   Final Clinical Impression(s) / ED Diagnoses Final diagnoses:  Periumbilical abdominal pain  Hypokalemia  Non-intractable vomiting with nausea, unspecified vomiting type    Rx / DC Orders ED Discharge Orders    None       Janeece Fitting, Hershal Coria 03/02/20 1939    Valarie Merino, MD 03/05/20 [phone]

## 2020-03-02 NOTE — H&P (Signed)
History and Physical    [name] TSV:[id number] DOB: 06-16-1956 DOA: 03/02/2020  PCP: [name], NP  Patient coming from:  Home   I have personally briefly reviewed patient's old medical records in [place]  Chief Complaint: Nausea vomiting diarrhea plus abdominal pain  HPI: [name] is a 64 y.o. female with medical history significant of hypertension, diabetes mellitus type 2, insulin-dependent, hyperlipidemia, paroxysmal atrial fibrillation, chronic congestive heart failure with reduced ejection fraction (45-50% 06/2018), chronic obstructive pulmonary disease, obstructive sleep apnea on nightly CPAP, morbid obesity, chronic kidney disease stage III, with baseline creatinine 1.6, gastroesophageal reflux disease who came to hospital for evaluation of persistent nausea vomiting with diarrhea.  Patient stated that her symptoms started on Friday, after she had a roast beef sandwich.  Her symptoms include multiple episode of nausea with vomiting as well as diarrhea per day.  She described at times dark vomiting mixed with greenish vomiting, and similarly dark-colored diarrhea.  She denied having any bright red blood per rectum.  She reported diffuse abdominal pain, about 8/10, cramping of character, intermittent.  She had similar symptoms about a month ago, which resolved spontaneously, although there were not as prominent as today.  Denies any recent travel, and similar symptoms in any family members.  Due to symptoms she was unable to take all her medications, including medication for control of heart rate.  ED Course:  In the ED patient was found to be afebrile temperature 98.4 F.  Blood pressure was stable 117/94, however her heart rate was elevated to 113/min, with rhythm consistent with atrial fibrillation.  Patient was not hypoxic. CBC shows no leukocytosis or anemia, Hemoccult 14.3. Chemistry was positive for elevated creatinine 1.7, as well as mild  hypokalemia 2.8 with hyponatremia 136.  Glucose 259, without evidence of DKA.  Noted negative troponin x2 and proBNP of 43.  Lipase and LFTs within normal limits.  Chest x-ray showed no acute cardiopulmonary abnormalities.  Abdominal x-ray negative for acute finding.  Patient had previous CT abdomen pelvis on 6/11, with no acute findings. Patient was treated with oral Cardizem, metoprolol, given pantoprazole, IV potassium replacement and IV fluids, as well as pain control with IV morphine. Patient will be admitted to medical telemetry for close monitoring and further treatment  Review of Systems: Review of Systems  Constitutional: Positive for malaise/fatigue. Negative for chills and fever.  Respiratory: Negative for cough, shortness of breath and wheezing.   Cardiovascular: Negative for chest pain, orthopnea and leg swelling.  Gastrointestinal: Positive for abdominal pain, diarrhea, nausea and vomiting.  Genitourinary: Negative for dysuria, frequency and urgency.  Musculoskeletal: Negative for back pain, joint pain and myalgias.  Neurological: Negative for dizziness, sensory change, speech change, focal weakness and headaches.  Psychiatric/Behavioral: Negative for depression and hallucinations. The patient is not nervous/anxious.    As per HPI otherwise all other systems reviewed and are negative.   Past Medical History:  Diagnosis Date  . Allergic rhinitis   . Arthritis   . Asthma   . Chest pain 12/27/2017  . Chronic diastolic CHF (congestive heart failure) ([place])   . COPD (chronic obstructive pulmonary disease) ([place])   . Depression   . DM (diabetes mellitus) ([place])   . DVT (deep vein thrombosis) in pregnancy   . Dysrhythmia    atrial fibrilation  . GERD (gastroesophageal reflux disease)   . Headache(784.0)   . HTN (hypertension)   . Hyperlipidemia   . Obesity   . Pneumonia 04/2018  RIGHT LOBE  . Shortness of breath   . Sleep apnea    compliant with CPAP    Past Surgical  History:  Procedure Laterality Date  . ABDOMINAL HYSTERECTOMY    . CATARACT EXTRACTION, BILATERAL    . CHOLECYSTECTOMY    . COLONOSCOPY WITH PROPOFOL N/A 03/05/2015   Procedure: COLONOSCOPY WITH PROPOFOL;  Surgeon: Carol Ada, MD;  Location: [facility];  Service: Endoscopy;  Laterality: N/A;  . DIAGNOSTIC LAPAROSCOPY    . ingrown hallux Left   . KNEE SURGERY    . LEFT HEART CATH AND CORONARY ANGIOGRAPHY N/A 12/31/2017   Procedure: LEFT HEART CATH AND CORONARY ANGIOGRAPHY;  Surgeon: Charolette Forward, MD;  Location: [facility];  Service: Cardiovascular;  Laterality: N/A;    Social History  reports that she has never smoked. She has never used smokeless tobacco. She reports that she does not drink alcohol and does not use drugs.  Allergies  Allergen Reactions  . Sulfa Antibiotics Itching   Family History  Problem Relation Age of Onset  . Cancer Mother   . Hypertension Mother   . Cancer Father   . CAD Other   . Hypertension Sister   . Hypertension Brother    Prior to Admission medications   Medication Sig Start Date End Date Taking? Authorizing Provider  Accu-Chek FastClix Lancets MISC Use as directed three times daily 03/10/19   [name], NP  albuterol (VENTOLIN HFA) 108 (90 Base) MCG/ACT inhaler Inhale 2 puffs into the lungs every 4 (four) hours as needed for wheezing or shortness of breath.  08/15/19   [provider]  Alcohol Swabs (B-D SINGLE USE SWABS REGULAR) PADS  02/19/19   [provider]  Blood Glucose Monitoring Suppl (TRUE METRIX METER) w/Device KIT Use to check blood sugar daily. 03/10/19   Charlott Rakes, MD  dabigatran (PRADAXA) 150 MG CAPS capsule Take 1 capsule (150 mg total) by mouth 2 (two) times daily. 09/26/19   Georgette Shell, MD  digoxin (LANOXIN) 0.125 MG tablet Take 125 mcg by mouth daily. 11/04/19   [provider]  diltiazem (CARDIZEM) 60 MG tablet Take 1 tablet (60 mg total) by mouth every 8 (eight)  hours. Patient taking differently: Take 60 mg by mouth 3 (three) times daily.  09/26/19   Georgette Shell, MD  Dulaglutide (TRULICITY) 1.5 MB/5.5HR SOPN Inject 1.5 mg into the skin once a week. 12/23/19   Scot Jun, FNP  DULoxetine (CYMBALTA) 60 MG capsule Take 1 capsule (60 mg total) by mouth daily. 05/20/18   Charolette Forward, MD  furosemide (LASIX) 40 MG tablet Take 1 tablet (40 mg total) by mouth daily. Patient taking differently: Take 40 mg by mouth 2 (two) times daily.  02/24/19 02/24/20  Shelly Coss, MD  glucose blood (TRUE METRIX BLOOD GLUCOSE TEST) test strip Use as instructed to check blood sugar daily. 03/10/19   Charlott Rakes, MD  Insulin Syringe-Needle U-100 (TRUEPLUS INSULIN SYRINGE) 31G X 5/16" 0.3 ML MISC Use to administer insulin every day. 03/24/19   Newlin, Charlane Ferretti, MD  LEVEMIR 100 UNIT/ML injection INJECT 0.4 MLS (40 UNITS TOTAL) INTO THE SKIN 2 (TWO) TIMES DAILY. Patient taking differently: Inject 50 Units into the skin 2 (two) times daily.  08/07/19   [name], NP  metolazone (ZAROXOLYN) 5 MG tablet Take 5 mg by mouth daily. 06/01/19   [provider]  metoprolol tartrate (LOPRESSOR) 25 MG tablet Take 1 tablet (25 mg total) by mouth 2 (two)  times daily. 02/24/19   Shelly Coss, MD  Multiple Vitamins-Minerals (MULTIVITAMIN WOMEN PO) Take 1 tablet by mouth daily. Patient not taking: Reported on 02/24/2020    [provider]  nitroGLYCERIN (NITROSTAT) 0.4 MG SL tablet Place 1 tablet (0.4 mg total) under the tongue every 5 (five) minutes x 3 doses as needed for chest pain. Patient not taking: Reported on 02/24/2020 01/08/14   Charolette Forward, MD  NOVOLOG 100 UNIT/ML injection INJECT 8 UNITS INTO THE SKIN 3 (THREE) TIMES DAILY WITH MEALS. TAKE ONLY WHEN YOUR EAT MORE THAN 50% OF YOUR MEALS Patient taking differently: Inject 5 Units into the skin 3 (three) times daily with meals.  09/02/19   Charlott Rakes, MD  pantoprazole (PROTONIX) 40 MG tablet Take  1 tablet (40 mg total) by mouth 2 (two) times daily. 01/01/18   Regalado, Belkys A, MD  potassium chloride (MICRO-K) 10 MEQ CR capsule Take 1 capsule (10 mEq total) by mouth daily. 12/25/19   Scot Jun, FNP  RESTASIS 0.05 % ophthalmic emulsion Place 1 drop into both eyes 2 (two) times daily. 11/19/17   [provider]  rosuvastatin (CRESTOR) 10 MG tablet Take 10 mg by mouth daily. 12/07/19   [provider]  sucralfate (CARAFATE) 1 g tablet Take 1 tablet (1 g total) by mouth 4 (four) times daily as needed. 01/30/20   Maudie Flakes, MD  traMADol (ULTRAM) 50 MG tablet Take 1 tablet (50 mg total) by mouth every 8 (eight) hours as needed. 02/24/20   [name], NP    Physical Exam: Vitals:   03/02/20 1516 03/02/20 1842 03/02/20 2000 03/02/20 2055  BP:  (!) 147/71 (!) 117/94 (!) 133/114  Pulse:  (!) 113 (!) 104 (!) 101  Resp:  (!) _0 Temp: 98.4 F (36.9 C)     TempSrc: Oral     SpO2:  99% 98% 98%    Constitutional:  ill-appearing but not in acute distress, obese Vitals:   03/02/20 1516 03/02/20 1842 03/02/20 2000 03/02/20 2055  BP:  (!) 147/71 (!) 117/94 (!) 133/114  Pulse:  (!) 113 (!) 104 (!) 101  Resp:  (!) _1 Temp: 98.4 F (36.9 C)     TempSrc: Oral     SpO2:  99% 98% 98%   Eyes: PERRL, lids and conjunctivae normal ENMT: Mucous membranes are moist. Posterior pharynx clear of any exudate or lesions.Normal dentition.  Neck: normal, supple, no masses, no thyromegaly Respiratory: clear to auscultation bilaterally, no wheezing, no crackles. Normal respiratory effort. No accessory muscle use.  Cardiovascular: Irregular rhythm and rate, no murmurs / rubs / gallops. No extremity edema. 2+ pedal pulses. No carotid bruits.  Abdomen: Mild diffuse tenderness, no masses palpated. No hepatosplenomegaly. Bowel sounds positive.  Musculoskeletal: no clubbing / cyanosis. No joint deformity upper and lower extremities. Good ROM, no contractures. Normal  muscle tone.  Skin: no rashes, lesions, ulcers. No induration Neurologic: CN 2-12 grossly intact. Sensation intact, DTR normal. Strength 5/5 in all 4.  Psychiatric: Normal judgment and insight. Alert and oriented x 3. Normal mood.   Labs on Admission: I have personally reviewed following labs and imaging studies  CBC: Recent Labs  Lab 03/02/20 1527  WBC 10.3  NEUTROABS 7.3  HGB 14.3  HCT 41.6  MCV 87.2  PLT 343    Basic Metabolic Panel: Recent Labs  Lab 03/02/20 1527  NA 136  K 2.8*  CL 90*  CO2 31  GLUCOSE 259*  BUN 15  CREATININE 1.74*  CALCIUM 9.1    GFR: Estimated Creatinine Clearance: 44.9 mL/min (A) (by C-G formula based on SCr of 1.74 mg/dL (H)).  Liver Function Tests: Recent Labs  Lab 03/02/20 1527  AST 20  ALT 15  ALKPHOS 81  BILITOT 0.8  PROT 7.7  ALBUMIN 3.2*    Urine analysis:    Component Value Date/Time   COLORURINE YELLOW 09/25/2019 1231   APPEARANCEUR CLEAR 09/25/2019 1231   LABSPEC 1.015 09/25/2019 1231   PHURINE 6.0 09/25/2019 1231   GLUCOSEU >=500 (A) 09/25/2019 1231   HGBUR NEGATIVE 09/25/2019 1231   BILIRUBINUR NEGATIVE 09/25/2019 1231   BILIRUBINUR negative 09/24/2019 1742   KETONESUR NEGATIVE 09/25/2019 1231   PROTEINUR NEGATIVE 09/25/2019 1231   UROBILINOGEN 1.0 09/24/2019 1742   UROBILINOGEN 1.0 10/26/2014 0249   NITRITE NEGATIVE 09/25/2019 1231   LEUKOCYTESUR NEGATIVE 09/25/2019 1231    Radiological Exams on Admission: DG Abdomen 1 View  Result Date: 03/02/2020 CLINICAL DATA:  Abdominal pain and diarrhea EXAM: ABDOMEN - 1 VIEW COMPARISON:  None. FINDINGS: The bowel gas pattern is normal. Surgical clips are seen in the right upper quadrant. No radio-opaque calculi or other significant radiographic abnormality are seen. IMPRESSION: Negative. Electronically Signed   By: Prudencio Pair M.D.   On: 03/02/2020 20:25   DG Chest Portable 1 View  Result Date: 03/02/2020 CLINICAL DATA:  Chest pain EXAM: PORTABLE CHEST 1 VIEW  COMPARISON:  02/29/2020 FINDINGS: Cardiomegaly. No consolidation or edema. No pleural effusion or pneumothorax. The visualized skeletal structures are unremarkable. IMPRESSION: No acute process in the chest.  Stable cardiomegaly. Electronically Signed   By: Macy Mis M.D.   On: 03/02/2020 14:58   EKG: Independently reviewed.  Shows atrial fibrillation with rapid ventricular sponsor but heart rate to 110 to 115/min  Assessment/Plan Principal Problem:   Acute gastroenteritis Active Problems:   Atrial fibrillation with RVR (HCC)   Morbid obesity (HCC)   OSA (obstructive sleep apnea)   Nausea vomiting and diarrhea   Diabetes mellitus type 2 in obese (HCC)   Congestive heart failure with left ventricular diastolic dysfunction, acute (HCC)   Nausea vomiting and diarrhea Acute gastroenteritis versus diabetic gastroparesis Patient has ongoing symptoms concerning for either acute gastroenteritis or diabetic gastroparesis. Patient had no vomiting or diarrhea while in the ED. Obtain stool studies including C. difficile toxin as well as GI panel. Continue IV fluids, antiemetics and analgesics.  Continue pantoprazole.  Atrial fibrillation with rapid ventricular response Heart rate is poorly controlled, as patient missed her medications. Resume diltiazem, metoprolol and digoxin.  Resume anticoagulation with Pradaxa. Currently patient does not require any IV drip for control of her abnormal heart rate.  Chronic kidney disease stage III Renal function seems to be close to baseline, creatinine 1.6. Patient has no signs of volume overload hence continue with IV fluids, with potassium content. Holding diuretics for now  Diabetes mellitus type 2, poorly controlled Patient currently has no evidence of DKA or hyperosmolar state. Resume insulin regimen, with reduced dose, Lantus 40 units twice daily, with sliding scale coverage. Resume carbohydrate consistent diet.  Chronic congestive heart  failure with reduced ejection fraction 45 to 50% Patient currently without signs of volume overload, no edema, clear chest x-ray. Given signs of dehydration, holding diuretics.   Continue with IV fluids, with close monitoring volume status  Obstructive sleep apnea      Continue nightly CPAP Gastroesophageal reflux disease     Continue pantoprazole  Morbid obesity  Weight loss strongly encouraged, as complicates all hospital medical care   DVT prophylaxis: Pradaxa Code Status:        Full code Family Communication:  Claris Pong, sister Disposition Plan:   Patient is from: Home  Anticipated DC to: Home  Anticipated DC date: 03/04/2020   Admission status:  Observation  Severity of Illness:  The appropriate patient status for this patient is OBSERVATION. Observation status is judged to be reasonable and necessary in order to provide the required intensity of service to ensure the patient's safety. The patient's presenting symptoms, physical exam findings, and initial radiographic and laboratory data in the context of their medical condition is felt to place them at decreased risk for further clinical deterioration. Furthermore, it is anticipated that the patient will be medically stable for discharge from the hospital within 2 midnights of admission. The following factors support the patient status of observation.   " The patient's presenting symptoms include: Nausea vomiting diarrhea " The physical exam findings include: Overall tenderness " The initial radiographic and laboratory data are:   Allie Dimmer MD [facility]  How to contact the [facility] Attending or Consulting provider [place] or covering provider during after hours [place], for this patient?   1. Check the care team in [facility] and look for a) attending/consulting [facility] provider listed and b) the [facility] team listed 2. Log into [email or web address] and use [place]'s universal password to access. If you do not have the  password, please contact the hospital operator. 3. Locate the [facility] provider you are looking for under [facility] and page to a number that you can be directly reached. 4. If you still have difficulty reaching the provider, please page the [facility] (Director on Call) for the Hospitalists listed on amion for assistance.  03/02/2020, 9:28 PM

## 2020-03-03 LAB — CBC WITH DIFFERENTIAL/PLATELET
Abs Immature Granulocytes: 0.05 10*3/uL (ref 0.00–0.07)
Basophils Absolute: 0 10*3/uL (ref 0.0–0.1)
Basophils Relative: 0 %
Eosinophils Absolute: 0.1 10*3/uL (ref 0.0–0.5)
Eosinophils Relative: 1 %
HCT: 39.7 % (ref 36.0–46.0)
Hemoglobin: 13.2 g/dL (ref 12.0–15.0)
Immature Granulocytes: 0 %
Lymphocytes Relative: 19 %
Lymphs Abs: 2.3 10*3/uL (ref 0.7–4.0)
MCH: 29.5 pg (ref 26.0–34.0)
MCHC: 33.2 g/dL (ref 30.0–36.0)
MCV: 88.8 fL (ref 80.0–100.0)
Monocytes Absolute: 1.1 10*3/uL — ABNORMAL HIGH (ref 0.1–1.0)
Monocytes Relative: 9 %
Neutro Abs: 8.4 10*3/uL — ABNORMAL HIGH (ref 1.7–7.7)
Neutrophils Relative %: 71 %
Platelets: 215 10*3/uL (ref 150–400)
RBC: 4.47 MIL/uL (ref 3.87–5.11)
RDW: 13.5 % (ref 11.5–15.5)
WBC: 12 10*3/uL — ABNORMAL HIGH (ref 4.0–10.5)
nRBC: 0 % (ref 0.0–0.2)

## 2020-03-03 LAB — MAGNESIUM: Magnesium: 1.2 mg/dL — ABNORMAL LOW (ref 1.7–2.4)

## 2020-03-03 LAB — COMPREHENSIVE METABOLIC PANEL
ALT: 16 U/L (ref 0–44)
AST: 24 U/L (ref 15–41)
Albumin: 3.2 g/dL — ABNORMAL LOW (ref 3.5–5.0)
Alkaline Phosphatase: 78 U/L (ref 38–126)
Anion gap: 13 (ref 5–15)
BUN: 15 mg/dL (ref 8–23)
CO2: 29 mmol/L (ref 22–32)
Calcium: 8.8 mg/dL — ABNORMAL LOW (ref 8.9–10.3)
Chloride: 94 mmol/L — ABNORMAL LOW (ref 98–111)
Creatinine, Ser: 1.71 mg/dL — ABNORMAL HIGH (ref 0.44–1.00)
GFR calc Af Amer: 36 mL/min — ABNORMAL LOW (ref >60)
GFR calc non Af Amer: 31 mL/min — ABNORMAL LOW (ref >60)
Glucose, Bld: 210 mg/dL — ABNORMAL HIGH (ref 70–99)
Potassium: 3.2 mmol/L — ABNORMAL LOW (ref 3.5–5.1)
Sodium: 136 mmol/L (ref 135–145)
Total Bilirubin: 0.8 mg/dL (ref 0.3–1.2)
Total Protein: 7.3 g/dL (ref 6.5–8.1)

## 2020-03-03 LAB — CBG MONITORING, ED: Glucose-Capillary: 185 mg/dL — ABNORMAL HIGH (ref 70–99)

## 2020-03-03 LAB — GLUCOSE, CAPILLARY
Glucose-Capillary: 214 mg/dL — ABNORMAL HIGH (ref 70–99)
Glucose-Capillary: 204 mg/dL — ABNORMAL HIGH (ref 70–99)
Glucose-Capillary: 196 mg/dL — ABNORMAL HIGH (ref 70–99)

## 2020-03-03 LAB — HEMOGLOBIN A1C
Hgb A1c MFr Bld: 10 % — ABNORMAL HIGH (ref 4.8–5.6)
Mean Plasma Glucose: 240.3 mg/dL

## 2020-03-03 LAB — PHOSPHORUS: Phosphorus: 2.9 mg/dL (ref 2.5–4.6)

## 2020-03-03 MED ORDER — DILTIAZEM HCL 25 MG/5ML IV SOLN
10.0000 mg | Freq: Once | INTRAVENOUS | Status: AC
  Administered 2020-03-03: 10 mg via INTRAVENOUS
  Filled 2020-03-03: qty 5

## 2020-03-03 MED ORDER — TRAMADOL HCL 50 MG PO TABS
50.0000 mg | ORAL_TABLET | Freq: Three times a day (TID) | ORAL | Status: DC | PRN
  Administered 2020-03-03 – 2020-03-07 (×7): 50 mg via ORAL
  Filled 2020-03-03 (×8): qty 1

## 2020-03-03 MED ORDER — DABIGATRAN ETEXILATE MESYLATE 150 MG PO CAPS
150.0000 mg | ORAL_CAPSULE | Freq: Two times a day (BID) | ORAL | Status: DC
  Administered 2020-03-03 – 2020-03-08 (×11): 150 mg via ORAL
  Filled 2020-03-03 (×12): qty 1

## 2020-03-03 MED ORDER — ALBUTEROL SULFATE (2.5 MG/3ML) 0.083% IN NEBU: 2.5000 mg | INHALATION_SOLUTION | RESPIRATORY_TRACT | Status: DC | PRN

## 2020-03-03 MED ORDER — DULOXETINE HCL 60 MG PO CPEP
60.0000 mg | ORAL_CAPSULE | Freq: Every day | ORAL | Status: DC
  Administered 2020-03-03 – 2020-03-08 (×6): 60 mg via ORAL
  Filled 2020-03-03 (×7): qty 1

## 2020-03-03 MED ORDER — MAGNESIUM SULFATE 4 GM/100ML IV SOLN
4.0000 g | Freq: Once | INTRAVENOUS | Status: AC
  Administered 2020-03-03: 4 g via INTRAVENOUS
  Filled 2020-03-03: qty 100

## 2020-03-03 MED ORDER — ACETAMINOPHEN 650 MG RE SUPP: 650.0000 mg | Freq: Four times a day (QID) | RECTAL | Status: DC | PRN

## 2020-03-03 MED ORDER — POTASSIUM CHLORIDE CRYS ER 20 MEQ PO TBCR
20.0000 meq | EXTENDED_RELEASE_TABLET | Freq: Two times a day (BID) | ORAL | Status: DC
  Administered 2020-03-03 – 2020-03-06 (×7): 20 meq via ORAL
  Filled 2020-03-03 (×7): qty 1

## 2020-03-03 MED ORDER — ACETAMINOPHEN 325 MG PO TABS
650.0000 mg | ORAL_TABLET | Freq: Four times a day (QID) | ORAL | Status: DC | PRN
  Administered 2020-03-04: 650 mg via ORAL
  Filled 2020-03-03: qty 2

## 2020-03-03 MED ORDER — PROCHLORPERAZINE EDISYLATE 10 MG/2ML IJ SOLN: 10.0000 mg | Freq: Four times a day (QID) | INTRAMUSCULAR | Status: DC | PRN

## 2020-03-03 MED ORDER — DIGOXIN 125 MCG PO TABS
125.0000 ug | ORAL_TABLET | Freq: Every day | ORAL | Status: DC
  Administered 2020-03-03 – 2020-03-08 (×6): 125 ug via ORAL
  Filled 2020-03-03 (×6): qty 1

## 2020-03-03 MED ORDER — INSULIN GLARGINE 100 UNIT/ML ~~LOC~~ SOLN
40.0000 [IU] | Freq: Two times a day (BID) | SUBCUTANEOUS | Status: DC
  Administered 2020-03-03 – 2020-03-08 (×11): 40 [IU] via SUBCUTANEOUS
  Filled 2020-03-03 (×13): qty 0.4

## 2020-03-03 MED ORDER — INSULIN ASPART 100 UNIT/ML ~~LOC~~ SOLN
0.0000 [IU] | Freq: Three times a day (TID) | SUBCUTANEOUS | Status: DC
  Administered 2020-03-03: 3 [IU] via SUBCUTANEOUS
  Administered 2020-03-04: 2 [IU] via SUBCUTANEOUS
  Administered 2020-03-04: 8 [IU] via SUBCUTANEOUS
  Administered 2020-03-04 – 2020-03-05 (×2): 3 [IU] via SUBCUTANEOUS
  Administered 2020-03-05: 5 [IU] via SUBCUTANEOUS
  Administered 2020-03-06: 2 [IU] via SUBCUTANEOUS
  Administered 2020-03-06 – 2020-03-08 (×4): 3 [IU] via SUBCUTANEOUS

## 2020-03-03 MED ORDER — POTASSIUM CHLORIDE IN NACL 20-0.45 MEQ/L-% IV SOLN
INTRAVENOUS | Status: DC
  Filled 2020-03-03 (×3): qty 1000

## 2020-03-03 NOTE — ED Notes (Signed)
Breakfast Ordered--[name]  

## 2020-03-03 NOTE — ED Notes (Signed)
Pt's CBG result was 185. Informed Luellen Pucker - RN.

## 2020-03-03 NOTE — ED Notes (Signed)
Bedside commode placed at pt's bedside. 

## 2020-03-03 NOTE — Progress Notes (Signed)
RT spoke with patient about wearing CPAP and patient stated that she is still feeling nauseous and having acid reflux.  Patient doesn't wish to wear CPAP at this time.  RT made RN aware.

## 2020-03-03 NOTE — ED Notes (Addendum)
Pt actively vomiting, with current rhythm change to Afib,new EKG in chart, Opyd, MD [facility] paged. Awaiting response.

## 2020-03-03 NOTE — ED Notes (Signed)
Pt NSR at this time HR 62bpm

## 2020-03-03 NOTE — Progress Notes (Addendum)
PROGRESS NOTE    [name]  JSH:[id number] DOB: [birth year] DOA: 03/02/2020 PCP: [name], NP   Brief Narrative: HPI per [name] on 03/02/20 [name] is a 64 y.o. female with medical history significant of hypertension, diabetes mellitus type 2, insulin-dependent, hyperlipidemia, paroxysmal atrial fibrillation, chronic congestive heart failure with reduced ejection fraction (45-50% 06/2018), chronic obstructive pulmonary disease, obstructive sleep apnea on nightly CPAP, morbid obesity, chronic kidney disease stage III, with baseline creatinine 1.6, gastroesophageal reflux disease who came to hospital for evaluation of persistent nausea vomiting with diarrhea.  Patient stated that her symptoms started on Friday, after she had a roast beef sandwich.  Her symptoms include multiple episode of nausea with vomiting as well as diarrhea per day.  She described at times dark vomiting mixed with greenish vomiting, and similarly dark-colored diarrhea.  She denied having any bright red blood per rectum.  She reported diffuse abdominal pain, about 8/10, cramping of character, intermittent.  She had similar symptoms about a month ago, which resolved spontaneously, although there were not as prominent as today.  Denies any recent travel, and similar symptoms in any family members.  Due to symptoms she was unable to take all her medications, including medication for control of heart rate.  ED Course:  In the ED patient was found to be afebrile temperature 98.4 F.  Blood pressure was stable 117/94, however her heart rate was elevated to 113/min, with rhythm consistent with atrial fibrillation.  Patient was not hypoxic. CBC shows no leukocytosis or anemia, Hemoccult 14.3. Chemistry was positive for elevated creatinine 1.7, as well as mild hypokalemia 2.8 with hyponatremia 136.  Glucose 259, without evidence of DKA.  Noted negative troponin x2 and proBNP of 43.  Lipase and  LFTs within normal limits.  Chest x-ray showed no acute cardiopulmonary abnormalities.  Abdominal x-ray negative for acute finding.  Patient had previous CT abdomen pelvis on 6/11, with no acute findings. Patient was treated with oral Cardizem, metoprolol, given pantoprazole, IV potassium replacement and IV fluids, as well as pain control with IV morphine. Patient will be admitted to medical telemetry for close monitoring and further treatment  **Interim History  She states that her nausea is improving somewhat but continues have some diarrhea.  Continues to have some abdominal pain.  States that the symptoms started after she ate [employer] last week.  Continue to get IV fluid resuscitation.  Assessment & Plan:   Principal Problem:   Acute gastroenteritis Active Problems:   Morbid obesity (HCC)   OSA (obstructive sleep apnea)   Nausea vomiting and diarrhea   Diabetes mellitus type 2 in obese (HCC)   Atrial fibrillation with RVR (HCC)   Congestive heart failure with left ventricular diastolic dysfunction, acute (HCC)  Nausea vomiting and diarrhea Acute gastroenteritis versus diabetic gastroparesis -Patient has ongoing symptoms concerning for either acute gastroenteritis or diabetic gastroparesis. -Patient had no vomiting but continues to have some Diarrhea -Obtain stool studies including C. difficile toxin as well as GI panel. -Continue IV fluids, antiemetics and analgesics.  Continue pantoprazole. -WBC went from 10 but there is now 12.0 -Continue monitor  Atrial fibrillation with rapid ventricular response -Heart rate is poorly controlled, as patient missed her medications. -Resume diltiazem, metoprolol and digoxin.  Resume anticoagulation with Pradaxa. -Currently patient does not require any IV drip for control of her abnormal heart rate. -Continue to monitor on telemetry  Chronic kidney disease stage III -Renal function seems to be close to baseline, creatinine 1.6. -Patient  has  no signs of volume overload hence continue with IV fluids, with potassium content. -Holding diuretics for now and may resume in the a.m. possibly -Avoid nephrotoxic medications, contrast dyes, hypotension and renally adjust medications  -Repeat CMP in a.m.  Diabetes mellitus type 2, poorly controlled -Patient currently has no evidence of DKA or hyperosmolar state. -Resume insulin regimen, with reduced dose, Lantus 40 units twice daily, with sliding scale coverage. -Resume carbohydrate consistent diet. -CBGs ranging from 204-214 -HbA1c is 10.0  Hypokalemia -Replete -Continue to monitor and replete as necessary Repeat CMP in a.m.  Chronic congestive heart failure with reduced ejection fraction 45 to 50% -Patient currently without signs of volume overload, no edema, clear chest x-ray. -Given signs of dehydration, holding diuretics.   -BNP was 43.7 but is not -Continue with Metoprolol tartrate 25 mg p.o. twice daily -Continue with IV fluids but will reduce rate from 125 mL's per hour of normal saline to 75 mL/hr, with close monitoring volume status -Continue to monitor for signs and symptoms of volume overload Obstructive sleep apnea       -Continue nightly CPAP  Gastroesophageal Reflux Disease (GERD) -Continue Pantoprazole 40 mg po BID but hold if she has C. difficile  Hypomagnesemia -Mag Level was 1.2 -Replete with IV Mag Sulfate 4 grams -Continue to Monitor and Replete as Necessary -Repeat Mag Level in the AM   Morbid Obesity   -Estimated body mass index is 42.29 kg/m as calculated from the following:   Height as of 02/24/20: 5\' 7"  (1.702 m).   Weight as of 02/24/20: 122.5 kg.  -Continued Weight Loss and Dietary Counseling as Morbid Obesity complicates all hospital medical care  DVT prophylaxis: Anticoagulated with Pradaxa Code Status: FULL CODE  Family Communication: No family present at bedside  Disposition Plan: Pending tolerance of p.o. diet and improvement in her  electrolytes and resolution of her diarrhea  Status is: Observation  The patient will require care spanning > 2 midnights and should be moved to inpatient because: Ongoing diagnostic testing needed not appropriate for outpatient work up, Unsafe d/c plan, IV treatments appropriate due to intensity of illness or inability to take PO and Inpatient level of care appropriate due to severity of illness  Dispo: The patient is from: Home              Anticipated d/c is to: TBD              Anticipated d/c date is: 2 days              Patient currently is not medically stable to d/c.  Consultants:   None   Procedures: None   Antimicrobials: Anti-infectives (From admission, onward)   None     Subjective: Seen and examined at bedside and she states that she is doing okay but still feels bad and continues to have some diarrhea but states her nausea improved.  States that she got sick last week after she ate some [employer].  Denies any chest pain, lightheadedness or dizziness currently.  No other concerns or complaints at this time.  Objective: Vitals:   03/03/20 0937 03/03/20 1002 03/03/20 1101 03/03/20 1103  BP: 139/65 107/84 (!) 130/105 124/85  Pulse: 77 (!) 101 88   Resp: 19 18 18    Temp:   97.9 F (36.6 C)   TempSrc:   Oral   SpO2: 98% 97% 100%     Intake/Output Summary (Last 24 hours) at 03/03/2020 1601 Last data filed at 03/03/2020 5009  Gross per 24 hour  Intake 100 ml  Output 350 ml  Net -250 ml   There were no vitals filed for this visit.  Examination: Physical Exam:  Constitutional: WN/WD morbidly obese African-American female in NAD and appears slightly anxious and does appear a little uncomfortable Eyes: Lids and conjunctivae normal, sclerae anicteric  ENMT: External Ears, Nose appear normal. Grossly normal hearing. Neck: Appears normal, supple, no cervical masses, normal ROM, no appreciable thyromegaly; no JVD Respiratory: Diminished to auscultation bilaterally, no  wheezing, rales, rhonchi or crackles. Normal respiratory effort and patient is not tachypenic. No accessory muscle use.  Unlabored breathing Cardiovascular: RRR, no murmurs / rubs / gallops. S1 and S2 auscultated.  Trace extremity edema.  Abdomen: Soft, somewhat-tender, distended secondary body habitus. Bowel sounds positive.  GU: Deferred. Musculoskeletal: No clubbing / cyanosis of digits/nails. No joint deformity upper and lower extremities.  Skin: No rashes, lesions, ulcers on limited skin evaluation. No induration; Warm and dry.  Neurologic: CN 2-12 grossly intact with no focal deficits. Romberg sign and cerebellar reflexes not assessed.  Psychiatric: Normal judgment and insight. Alert and oriented x 3.  Anxious mood and appropriate affect.   Data Reviewed: I have personally reviewed following labs and imaging studies  CBC: Recent Labs  Lab 03/02/20 1527 03/03/20 1117  WBC 10.3 12.0*  NEUTROABS 7.3 8.4*  HGB 14.3 13.2  HCT 41.6 39.7  MCV 87.2 88.8  PLT 259 702   Basic Metabolic Panel: Recent Labs  Lab 03/02/20 1527 03/03/20 1117  NA 136 136  K 2.8* 3.2*  CL 90* 94*  CO2 31 29  GLUCOSE 259* 210*  BUN 15 15  CREATININE 1.74* 1.71*  CALCIUM 9.1 8.8*  MG  --  1.2*  PHOS  --  2.9   GFR: Estimated Creatinine Clearance: 45.7 mL/min (A) (by C-G formula based on SCr of 1.71 mg/dL (H)). Liver Function Tests: Recent Labs  Lab 03/02/20 1527 03/03/20 1117  AST 20 24  ALT 15 16  ALKPHOS 81 78  BILITOT 0.8 0.8  PROT 7.7 7.3  ALBUMIN 3.2* 3.2*   Recent Labs  Lab 03/02/20 1527  LIPASE 15   No results for input(s): AMMONIA in the last 168 hours. Coagulation Profile: Recent Labs  Lab 03/02/20 1527  INR 1.5*   Cardiac Enzymes: No results for input(s): CKTOTAL, CKMB, CKMBINDEX, TROPONINI in the last 168 hours. BNP (last 3 results) No results for input(s): PROBNP in the last 8760 hours. HbA1C: Recent Labs    03/02/20 1527  HGBA1C 10.0*   CBG: Recent Labs    Lab 03/03/20 0725 03/03/20 1241  GLUCAP 185* 214*   Lipid Profile: No results for input(s): CHOL, HDL, LDLCALC, TRIG, CHOLHDL, LDLDIRECT in the last 72 hours. Thyroid Function Tests: No results for input(s): TSH, T4TOTAL, FREET4, T3FREE, THYROIDAB in the last 72 hours. Anemia Panel: No results for input(s): VITAMINB12, FOLATE, FERRITIN, TIBC, IRON, RETICCTPCT in the last 72 hours. Sepsis Labs: No results for input(s): PROCALCITON, LATICACIDVEN in the last 168 hours.  Recent Results (from the past 240 hour(s))  SARS Coronavirus 2 by RT PCR (hospital order, performed in [facility] hospital lab) Nasopharyngeal Nasopharyngeal Swab     Status: None   Collection Time: 03/02/20  7:51 PM   Specimen: Nasopharyngeal Swab  Result Value Ref Range Status   SARS Coronavirus 2 NEGATIVE NEGATIVE Final    Comment: (NOTE) SARS-CoV-2 target nucleic acids are NOT DETECTED.  The SARS-CoV-2 RNA is generally detectable in upper and lower respiratory  specimens during the acute phase of infection. The lowest concentration of SARS-CoV-2 viral copies this assay can detect is 250 copies / mL. A negative result does not preclude SARS-CoV-2 infection and should not be used as the sole basis for treatment or other patient management decisions.  A negative result may occur with improper specimen collection / handling, submission of specimen other than nasopharyngeal swab, presence of viral mutation(s) within the areas targeted by this assay, and inadequate number of viral copies (<250 copies / mL). A negative result must be combined with clinical observations, patient history, and epidemiological information.  Fact Sheet for Patients:   StrictlyIdeas.no  Fact Sheet for Healthcare Providers: BankingDealers.co.za  This test is not yet approved or  cleared by the [place] FDA and has been authorized for detection and/or diagnosis of SARS-CoV-2 by FDA  under an Emergency Use Authorization (EUA).  This EUA will remain in effect (meaning this test can be used) for the duration of the COVID-19 declaration under Section 564(b)(1) of the Act, 21 U.S.C. section 360bbb-3(b)(1), unless the authorization is terminated or revoked sooner.  Performed at [facility], [place] [address], [place], [place] [address]     RN Pressure Injury Documentation:     Estimated body mass index is 42.29 kg/m as calculated from the following:   Height as of 02/24/20: 5\' 7"  (1.702 m).   Weight as of 02/24/20: 122.5 kg.  Malnutrition Type:      Malnutrition Characteristics:      Nutrition Interventions:    Radiology Studies: DG Abdomen 1 View  Result Date: 03/02/2020 CLINICAL DATA:  Abdominal pain and diarrhea EXAM: ABDOMEN - 1 VIEW COMPARISON:  None. FINDINGS: The bowel gas pattern is normal. Surgical clips are seen in the right upper quadrant. No radio-opaque calculi or other significant radiographic abnormality are seen. IMPRESSION: Negative. Electronically Signed   By: Prudencio Pair M.D.   On: 03/02/2020 20:25   DG Chest Portable 1 View  Result Date: 03/02/2020 CLINICAL DATA:  Chest pain EXAM: PORTABLE CHEST 1 VIEW COMPARISON:  02/29/2020 FINDINGS: Cardiomegaly. No consolidation or edema. No pleural effusion or pneumothorax. The visualized skeletal structures are unremarkable. IMPRESSION: No acute process in the chest.  Stable cardiomegaly. Electronically Signed   By: Macy Mis M.D.   On: 03/02/2020 14:58   Scheduled Meds: . dabigatran  150 mg Oral BID  . digoxin  125 mcg Oral Daily  . diltiazem  60 mg Oral Q8H  . DULoxetine  60 mg Oral Daily  . insulin aspart  0-15 Units Subcutaneous TID WC  . insulin glargine  40 Units Subcutaneous BID  . metoprolol tartrate  25 mg Oral BID  . pantoprazole  40 mg Oral BID  . potassium chloride  20 mEq Oral BID  . rosuvastatin  10 mg Oral Daily   Continuous Infusions: . 0.45 % NaCl with KCl 20 mEq / L  125 mL/hr at 03/03/20 0942    LOS: 0 days   Kerney Elbe, DO [facility] PAGER is on AMION  If 7PM-7AM, please contact night-coverage [email or web address]

## 2020-03-04 ENCOUNTER — Other Ambulatory Visit: Payer: Self-pay

## 2020-03-04 ENCOUNTER — Inpatient Hospital Stay (HOSPITAL_COMMUNITY): Payer: Medicare HMO

## 2020-03-04 DIAGNOSIS — E1122 Type 2 diabetes mellitus with diabetic chronic kidney disease: Secondary | ICD-10-CM | POA: Diagnosis present

## 2020-03-04 DIAGNOSIS — E860 Dehydration: Secondary | ICD-10-CM | POA: Diagnosis not present

## 2020-03-04 DIAGNOSIS — R109 Unspecified abdominal pain: Secondary | ICD-10-CM

## 2020-03-04 DIAGNOSIS — Z6841 Body Mass Index (BMI) 40.0 and over, adult: Secondary | ICD-10-CM | POA: Diagnosis not present

## 2020-03-04 DIAGNOSIS — I5031 Acute diastolic (congestive) heart failure: Secondary | ICD-10-CM | POA: Diagnosis not present

## 2020-03-04 DIAGNOSIS — D649 Anemia, unspecified: Secondary | ICD-10-CM | POA: Diagnosis not present

## 2020-03-04 DIAGNOSIS — E1169 Type 2 diabetes mellitus with other specified complication: Secondary | ICD-10-CM | POA: Diagnosis not present

## 2020-03-04 DIAGNOSIS — N1832 Chronic kidney disease, stage 3b: Secondary | ICD-10-CM | POA: Diagnosis present

## 2020-03-04 DIAGNOSIS — G4733 Obstructive sleep apnea (adult) (pediatric): Secondary | ICD-10-CM | POA: Diagnosis present

## 2020-03-04 DIAGNOSIS — I5032 Chronic diastolic (congestive) heart failure: Secondary | ICD-10-CM | POA: Diagnosis present

## 2020-03-04 DIAGNOSIS — E785 Hyperlipidemia, unspecified: Secondary | ICD-10-CM | POA: Diagnosis present

## 2020-03-04 DIAGNOSIS — R197 Diarrhea, unspecified: Secondary | ICD-10-CM | POA: Diagnosis not present

## 2020-03-04 DIAGNOSIS — K219 Gastro-esophageal reflux disease without esophagitis: Secondary | ICD-10-CM | POA: Diagnosis present

## 2020-03-04 DIAGNOSIS — E1165 Type 2 diabetes mellitus with hyperglycemia: Secondary | ICD-10-CM | POA: Diagnosis present

## 2020-03-04 DIAGNOSIS — E875 Hyperkalemia: Secondary | ICD-10-CM | POA: Diagnosis not present

## 2020-03-04 DIAGNOSIS — E1143 Type 2 diabetes mellitus with diabetic autonomic (poly)neuropathy: Secondary | ICD-10-CM | POA: Diagnosis present

## 2020-03-04 DIAGNOSIS — I480 Paroxysmal atrial fibrillation: Secondary | ICD-10-CM | POA: Diagnosis present

## 2020-03-04 DIAGNOSIS — Z20822 Contact with and (suspected) exposure to covid-19: Secondary | ICD-10-CM | POA: Diagnosis present

## 2020-03-04 DIAGNOSIS — R112 Nausea with vomiting, unspecified: Secondary | ICD-10-CM | POA: Diagnosis not present

## 2020-03-04 DIAGNOSIS — K3184 Gastroparesis: Secondary | ICD-10-CM | POA: Diagnosis present

## 2020-03-04 DIAGNOSIS — I4891 Unspecified atrial fibrillation: Secondary | ICD-10-CM | POA: Diagnosis not present

## 2020-03-04 DIAGNOSIS — I130 Hypertensive heart and chronic kidney disease with heart failure and stage 1 through stage 4 chronic kidney disease, or unspecified chronic kidney disease: Secondary | ICD-10-CM | POA: Diagnosis present

## 2020-03-04 DIAGNOSIS — E871 Hypo-osmolality and hyponatremia: Secondary | ICD-10-CM | POA: Diagnosis present

## 2020-03-04 DIAGNOSIS — K529 Noninfective gastroenteritis and colitis, unspecified: Secondary | ICD-10-CM | POA: Diagnosis present

## 2020-03-04 DIAGNOSIS — E876 Hypokalemia: Secondary | ICD-10-CM | POA: Diagnosis present

## 2020-03-04 DIAGNOSIS — F329 Major depressive disorder, single episode, unspecified: Secondary | ICD-10-CM | POA: Diagnosis present

## 2020-03-04 DIAGNOSIS — J449 Chronic obstructive pulmonary disease, unspecified: Secondary | ICD-10-CM | POA: Diagnosis present

## 2020-03-04 LAB — CBC WITH DIFFERENTIAL/PLATELET
Abs Immature Granulocytes: 0.03 10*3/uL (ref 0.00–0.07)
Basophils Absolute: 0 10*3/uL (ref 0.0–0.1)
Basophils Relative: 0 %
Eosinophils Absolute: 0.2 10*3/uL (ref 0.0–0.5)
Eosinophils Relative: 2 %
HCT: 35.8 % — ABNORMAL LOW (ref 36.0–46.0)
Hemoglobin: 11.9 g/dL — ABNORMAL LOW (ref 12.0–15.0)
Immature Granulocytes: 0 %
Lymphocytes Relative: 24 %
Lymphs Abs: 2.6 10*3/uL (ref 0.7–4.0)
MCH: 29 pg (ref 26.0–34.0)
MCHC: 33.2 g/dL (ref 30.0–36.0)
MCV: 87.3 fL (ref 80.0–100.0)
Monocytes Absolute: 1 10*3/uL (ref 0.1–1.0)
Monocytes Relative: 9 %
Neutro Abs: 7 10*3/uL (ref 1.7–7.7)
Neutrophils Relative %: 65 %
Platelets: 214 10*3/uL (ref 150–400)
RBC: 4.1 MIL/uL (ref 3.87–5.11)
RDW: 13.4 % (ref 11.5–15.5)
WBC: 10.7 10*3/uL — ABNORMAL HIGH (ref 4.0–10.5)
nRBC: 0 % (ref 0.0–0.2)

## 2020-03-04 LAB — PHOSPHORUS: Phosphorus: 3.1 mg/dL (ref 2.5–4.6)

## 2020-03-04 LAB — COMPREHENSIVE METABOLIC PANEL
ALT: 13 U/L (ref 0–44)
AST: 19 U/L (ref 15–41)
Albumin: 2.9 g/dL — ABNORMAL LOW (ref 3.5–5.0)
Alkaline Phosphatase: 69 U/L (ref 38–126)
Anion gap: 10 (ref 5–15)
BUN: 11 mg/dL (ref 8–23)
CO2: 29 mmol/L (ref 22–32)
Calcium: 8.7 mg/dL — ABNORMAL LOW (ref 8.9–10.3)
Chloride: 97 mmol/L — ABNORMAL LOW (ref 98–111)
Creatinine, Ser: 1.53 mg/dL — ABNORMAL HIGH (ref 0.44–1.00)
GFR calc Af Amer: 42 mL/min — ABNORMAL LOW (ref >60)
GFR calc non Af Amer: 36 mL/min — ABNORMAL LOW (ref >60)
Glucose, Bld: 142 mg/dL — ABNORMAL HIGH (ref 70–99)
Potassium: 3.4 mmol/L — ABNORMAL LOW (ref 3.5–5.1)
Sodium: 136 mmol/L (ref 135–145)
Total Bilirubin: 0.5 mg/dL (ref 0.3–1.2)
Total Protein: 6.8 g/dL (ref 6.5–8.1)

## 2020-03-04 LAB — GASTROINTESTINAL PANEL BY PCR, STOOL (REPLACES STOOL CULTURE)

## 2020-03-04 LAB — GLUCOSE, CAPILLARY
Glucose-Capillary: 254 mg/dL — ABNORMAL HIGH (ref 70–99)
Glucose-Capillary: 151 mg/dL — ABNORMAL HIGH (ref 70–99)
Glucose-Capillary: 134 mg/dL — ABNORMAL HIGH (ref 70–99)
Glucose-Capillary: 147 mg/dL — ABNORMAL HIGH (ref 70–99)

## 2020-03-04 LAB — C DIFFICILE QUICK SCREEN W PCR REFLEX
C Diff antigen: NEGATIVE
C Diff interpretation: NOT DETECTED
C Diff toxin: NEGATIVE

## 2020-03-04 LAB — OCCULT BLOOD X 1 CARD TO LAB, STOOL: Fecal Occult Bld: NEGATIVE

## 2020-03-04 LAB — MAGNESIUM: Magnesium: 2.2 mg/dL (ref 1.7–2.4)

## 2020-03-04 MED ORDER — POTASSIUM CHLORIDE CRYS ER 20 MEQ PO TBCR
40.0000 meq | EXTENDED_RELEASE_TABLET | Freq: Two times a day (BID) | ORAL | Status: AC
  Administered 2020-03-04 (×2): 40 meq via ORAL
  Filled 2020-03-04 (×2): qty 2

## 2020-03-04 MED ORDER — POTASSIUM CHLORIDE IN NACL 20-0.45 MEQ/L-% IV SOLN
INTRAVENOUS | Status: AC
  Filled 2020-03-04 (×2): qty 1000

## 2020-03-04 NOTE — Progress Notes (Signed)
PROGRESS NOTE    [name]  PNT:[id number] DOB: [birth year] DOA: 03/02/2020 PCP: [name], NP  Brief Narrative: HPI per [name] on 03/02/20 [name] is a 64 y.o. female with medical history significant of hypertension, diabetes mellitus type 2, insulin-dependent, hyperlipidemia, paroxysmal atrial fibrillation, chronic congestive heart failure with reduced ejection fraction (45-50% 06/2018), chronic obstructive pulmonary disease, obstructive sleep apnea on nightly CPAP, morbid obesity, chronic kidney disease stage III, with baseline creatinine 1.6, gastroesophageal reflux disease who came to hospital for evaluation of persistent nausea vomiting with diarrhea.  Patient stated that her symptoms started on Friday, after she had a roast beef sandwich.  Her symptoms include multiple episode of nausea with vomiting as well as diarrhea per day.  She described at times dark vomiting mixed with greenish vomiting, and similarly dark-colored diarrhea.  She denied having any bright red blood per rectum.  She reported diffuse abdominal pain, about 8/10, cramping of character, intermittent.  She had similar symptoms about a month ago, which resolved spontaneously, although there were not as prominent as today.  Denies any recent travel, and similar symptoms in any family members.  Due to symptoms she was unable to take all her medications, including medication for control of heart rate.  ED Course:  In the ED patient was found to be afebrile temperature 98.4 F.  Blood pressure was stable 117/94, however her heart rate was elevated to 113/min, with rhythm consistent with atrial fibrillation.  Patient was not hypoxic. CBC shows no leukocytosis or anemia, Hemoccult 14.3. Chemistry was positive for elevated creatinine 1.7, as well as mild hypokalemia 2.8 with hyponatremia 136.  Glucose 259, without evidence of DKA.  Noted negative troponin x2 and proBNP of 43.  Lipase and  LFTs within normal limits.  Chest x-ray showed no acute cardiopulmonary abnormalities.  Abdominal x-ray negative for acute finding.  Patient had previous CT abdomen pelvis on 6/11, with no acute findings. Patient was treated with oral Cardizem, metoprolol, given pantoprazole, IV potassium replacement and IV fluids, as well as pain control with IV morphine. Patient will be admitted to medical telemetry for close monitoring and further treatment  **Interim History  She states that her nausea is improving somewhat but continues have some diarrhea now is having some crampy abdominal pain.  Continues to have some abdominal pain.  States that she ate [employer] last week.  Continues to get IV fluid resuscitation because she is not improving will obtain a CT of the abdomen pelvis without contrast given her renal function.  We will discussed the case with [name] if she fails to improve we will get his consultation formally.  Assessment & Plan:   Principal Problem:   Acute gastroenteritis Active Problems:   Morbid obesity (HCC)   OSA (obstructive sleep apnea)   Nausea vomiting and diarrhea   Diabetes mellitus type 2 in obese (HCC)   Atrial fibrillation with RVR (HCC)   Congestive heart failure with left ventricular diastolic dysfunction, acute (HCC)  Nausea vomiting and diarrhea Acute gastroenteritis versus diabetic gastroparesis -Patient has ongoing symptoms concerning for either acute gastroenteritis or diabetic gastroparesis. -Patient had no vomiting but continues to have some Diarrhea and this is persisted -Obtain stool studies including C. difficile toxin as well as GI panel.  C. difficile was negative and the GI pathogen panel is pending -Resumed IV fluids,  -Continue with antiemetics and analgesics.  Continue pantoprazole. -WBC went from 10.3 up to 12.0 is now 10.7 -continue supportive care and  will obtain a CT of the abdomen and pelvis without contrast -Continue monitor   Atrial fibrillation with rapid ventricular response, improved -Heart rate was poorly controlled, as patient missed her medications. -Resume diltiazem, metoprolol and digoxin.  Resume anticoagulation with Pradaxa. -Currently patient does not require any IV drip for control of her abnormal heart rate. -Continue to monitor on telemetry  Chronic kidney disease stage III, improving -Renal function seems to be close to baseline, creatinine 1.6. -Patient has no signs of volume overload hence continue with IV fluids, with potassium content. -Holding diuretics for now and may resume in the a.m. possibly but will hold for now given that she continues to have significant moderate diarrhea -We will resume IV fluids given her persistent diarrhea -Avoid nephrotoxic medications, contrast dyes, hypotension and renally adjust medications  -Repeat CMP in a.m.  Diabetes mellitus type 2, poorly controlled -Patient currently has no evidence of DKA or hyperosmolar state. -Resume insulin regimen, with reduced dose, Lantus 40 units twice daily, with sliding scale coverage. -Resume carbohydrate consistent diet. -CBGs ranging from 151-196 -HbA1c is 10.0 -She will ll need diabetes education coordinator evaluation for further recommendations  Hypokalemia -Patient's potassium this morning was 3.4 -In the setting of diarrhea -Replete with p.o. potassium chloride 40 mEq twice daily -Continue to monitor and replete as necessary -Repeat CMP in a.m.  Chronic congestive heart failure with reduced ejection fraction 45 to 50% -Patient currently without signs of volume overload, no edema, clear chest x-ray. -Given signs of dehydration, holding diuretics.   -BNP was 43.7 but is not -Continue with Metoprolol tartrate 25 mg p.o. twice daily -Continue with IV fluids but will reduce rate from 125 mL's per hour of normal saline to 75 mL/hr, with close monitoring volume status; IV fluids had expired but will resume given  that she is a little dry on exam -Continue to monitor for signs and symptoms of volume overload  Obstructive sleep apnea       -Continue nightly CPAP  Gastroesophageal Reflux Disease (GERD) -Continue Pantoprazole 40 mg po BID and she was negative for C. difficile  Hypomagnesemia -Mag Level was 1.2 and improved to 2.2 -Replete with IV Mag Sulfate 4 grams yesterday -Continue to Monitor and Replete as Necessary -Repeat Mag Level in the AM   Morbid Obesity   -Estimated body mass index is 41.81 kg/m as calculated from the following:   Height as of 02/24/20: 5\' 7"  (1.702 m).   Weight as of this encounter: 121.1 kg.  -Continued Weight Loss and Dietary Counseling as Morbid Obesity complicates all hospital medical care  DVT prophylaxis: Anticoagulated with Pradaxa Code Status: FULL CODE  Family Communication: No family present at bedside  Disposition Plan: Pending tolerance of p.o. diet and improvement in her electrolytes and resolution of her diarrhea  Status is: Inpatient  Remains inpatient appropriate because:Persistent severe electrolyte disturbances, Ongoing active pain requiring inpatient pain management, IV treatments appropriate due to intensity of illness or inability to take PO and Inpatient level of care appropriate due to severity of illness   Dispo: The patient is from: Home              Anticipated d/c is to: TBD              Anticipated d/c date is: 2 days              Patient currently is not medically stable to d/c.  Consultants:   None   Procedures: None   Antimicrobials: Anti-infectives (  From admission, onward)   None     Subjective: Seen and examined at bedside and states that she continues to have crampy abdominal pain and has had several bowel movements and does not feel as well.  Continues to have some nausea but no vomiting.  Denies any chest pain, lightheadedness or dizziness.  No other concerns or complaints at this time.  Objective: Vitals:    03/03/20 2109 03/04/20 0427 03/04/20 0500 03/04/20 1451  BP: 130/64 123/83  135/84  Pulse: 98 80  72  Resp: 20 18  18   Temp: 97.8 F (36.6 C) 98.6 F (37 C)  (!) 97.5 F (36.4 C)  TempSrc: Oral Oral  Oral  SpO2: 99% 99%  100%  Weight:   121.1 kg     Intake/Output Summary (Last 24 hours) at 03/04/2020 1616 Last data filed at 03/04/2020 1534 Gross per 24 hour  Intake 2929.14 ml  Output 1152 ml  Net 1777.14 ml   Filed Weights   03/04/20 0500  Weight: 121.1 kg   Examination: Physical Exam:  Constitutional: WN/WD who currently appears uncomfortable and she sitting in the chair bedside and not feeling as well complaining of some crampy abdominal pain and significant monitor diarrhea Eyes: Lids and conjunctivae normal, sclerae anicteric  ENMT: External Ears, Nose appear normal. Grossly normal hearing.  Neck: Appears normal, supple, no cervical masses, normal ROM, no appreciable thyromegaly,: No JVD Respiratory: Diminished to auscultation bilaterally, no wheezing, rales, rhonchi or crackles. Normal respiratory effort and patient is not tachypenic. No accessory muscle use.  Cardiovascular: RRR, no murmurs / rubs / gallops. S1 and S2 auscultated.  Trace extremity edema. 2+ pedal pulses. No carotid bruits.  Abdomen: Soft, tender to palpate, distended secondary body habitus. Bowel sounds positive.  GU: Deferred. Musculoskeletal: No clubbing / cyanosis of digits/nails. No joint deformity upper and lower extremities.  Skin: No rashes, lesions, ulcers on limited skin evaluation. No induration; Warm and dry.  Neurologic: CN 2-12 grossly intact with no focal deficits. Romberg sign and cerebellar reflexes not assessed.  Psychiatric: Normal judgment and insight. Alert and oriented x 3. Normal mood and appropriate affect.   Data Reviewed: I have personally reviewed following labs and imaging studies  CBC: Recent Labs  Lab 03/02/20 1527 03/03/20 1117 03/04/20 0416  WBC 10.3 12.0* 10.7*   NEUTROABS 7.3 8.4* 7.0  HGB 14.3 13.2 11.9*  HCT 41.6 39.7 35.8*  MCV 87.2 88.8 87.3  PLT 259 215 063   Basic Metabolic Panel: Recent Labs  Lab 03/02/20 1527 03/03/20 1117 03/04/20 0416  NA 136 136 136  K 2.8* 3.2* 3.4*  CL 90* 94* 97*  CO2 31 29 29   GLUCOSE 259* 210* 142*  BUN 15 15 11   CREATININE 1.74* 1.71* 1.53*  CALCIUM 9.1 8.8* 8.7*  MG  --  1.2* 2.2  PHOS  --  2.9 3.1   GFR: Estimated Creatinine Clearance: 50.7 mL/min (A) (by C-G formula based on SCr of 1.53 mg/dL (H)). Liver Function Tests: Recent Labs  Lab 03/02/20 1527 03/03/20 1117 03/04/20 0416  AST 20 24 19   ALT 15 16 13   ALKPHOS 81 78 69  BILITOT 0.8 0.8 0.5  PROT 7.7 7.3 6.8  ALBUMIN 3.2* 3.2* 2.9*   Recent Labs  Lab 03/02/20 1527  LIPASE 15   No results for input(s): AMMONIA in the last 168 hours. Coagulation Profile: Recent Labs  Lab 03/02/20 1527  INR 1.5*   Cardiac Enzymes: No results for input(s): CKTOTAL, CKMB, CKMBINDEX, TROPONINI in  the last 168 hours. BNP (last 3 results) No results for input(s): PROBNP in the last 8760 hours. HbA1C: Recent Labs    03/02/20 1527  HGBA1C 10.0*   CBG: Recent Labs  Lab 03/03/20 1241 03/03/20 1704 03/03/20 2107 03/04/20 0924 03/04/20 1205  GLUCAP 214* 204* 196* 254* 151*   Lipid Profile: No results for input(s): CHOL, HDL, LDLCALC, TRIG, CHOLHDL, LDLDIRECT in the last 72 hours. Thyroid Function Tests: No results for input(s): TSH, T4TOTAL, FREET4, T3FREE, THYROIDAB in the last 72 hours. Anemia Panel: No results for input(s): VITAMINB12, FOLATE, FERRITIN, TIBC, IRON, RETICCTPCT in the last 72 hours. Sepsis Labs: No results for input(s): PROCALCITON, LATICACIDVEN in the last 168 hours.  Recent Results (from the past 240 hour(s))  SARS Coronavirus 2 by RT PCR (hospital order, performed in [facility] hospital lab) Nasopharyngeal Nasopharyngeal Swab     Status: None   Collection Time: 03/02/20  7:51 PM   Specimen: Nasopharyngeal Swab   Result Value Ref Range Status   SARS Coronavirus 2 NEGATIVE NEGATIVE Final    Comment: (NOTE) SARS-CoV-2 target nucleic acids are NOT DETECTED.  The SARS-CoV-2 RNA is generally detectable in upper and lower respiratory specimens during the acute phase of infection. The lowest concentration of SARS-CoV-2 viral copies this assay can detect is 250 copies / mL. A negative result does not preclude SARS-CoV-2 infection and should not be used as the sole basis for treatment or other patient management decisions.  A negative result may occur with improper specimen collection / handling, submission of specimen other than nasopharyngeal swab, presence of viral mutation(s) within the areas targeted by this assay, and inadequate number of viral copies (<250 copies / mL). A negative result must be combined with clinical observations, patient history, and epidemiological information.  Fact Sheet for Patients:   StrictlyIdeas.no  Fact Sheet for Healthcare Providers: BankingDealers.co.za  This test is not yet approved or  cleared by the [place] FDA and has been authorized for detection and/or diagnosis of SARS-CoV-2 by FDA under an Emergency Use Authorization (EUA).  This EUA will remain in effect (meaning this test can be used) for the duration of the COVID-19 declaration under Section 564(b)(1) of the Act, 21 U.S.C. section 360bbb-3(b)(1), unless the authorization is terminated or revoked sooner.  Performed at [facility], [place] [address], [place], [place] [address]   Gastrointestinal Panel by PCR , Stool     Status: None   Collection Time: 03/04/20 10:04 AM   Specimen: STOOL  Result Value Ref Range Status   Campylobacter species NOT DETECTED NOT DETECTED Final   Plesimonas shigelloides NOT DETECTED NOT DETECTED Final   Salmonella species NOT DETECTED NOT DETECTED Final   Yersinia enterocolitica NOT DETECTED NOT DETECTED Final    Vibrio species NOT DETECTED NOT DETECTED Final   Vibrio cholerae NOT DETECTED NOT DETECTED Final   Enteroaggregative E coli (EAEC) NOT DETECTED NOT DETECTED Final   Enteropathogenic E coli (EPEC) NOT DETECTED NOT DETECTED Final   Enterotoxigenic E coli (ETEC) NOT DETECTED NOT DETECTED Final   Shiga like toxin producing E coli (STEC) NOT DETECTED NOT DETECTED Final   Shigella/Enteroinvasive E coli (EIEC) NOT DETECTED NOT DETECTED Final   Cryptosporidium NOT DETECTED NOT DETECTED Final   Cyclospora cayetanensis NOT DETECTED NOT DETECTED Final   Entamoeba histolytica NOT DETECTED NOT DETECTED Final   Giardia lamblia NOT DETECTED NOT DETECTED Final   Adenovirus F40/41 NOT DETECTED NOT DETECTED Final   Astrovirus NOT DETECTED NOT DETECTED Final  Norovirus GI/GII NOT DETECTED NOT DETECTED Final   Rotavirus A NOT DETECTED NOT DETECTED Final   Sapovirus (I, II, IV, and V) NOT DETECTED NOT DETECTED Final    Comment: Performed at [facility], [place], [place] [address]  C Difficile Quick Screen w PCR reflex     Status: None   Collection Time: 03/04/20 10:04 AM   Specimen: STOOL  Result Value Ref Range Status   C Diff antigen NEGATIVE NEGATIVE Final   C Diff toxin NEGATIVE NEGATIVE Final   C Diff interpretation No C. difficile detected.  Final    Comment: Performed at [facility], [place] [address] [place],  [address]    RN Pressure Injury Documentation:     Estimated body mass index is 41.81 kg/m as calculated from the following:   Height as of 02/24/20: 5\' 7"  (1.702 m).   Weight as of this encounter: 121.1 kg.  Malnutrition Type:      Malnutrition Characteristics:      Nutrition Interventions:    Radiology Studies: DG Abdomen 1 View  Result Date: 03/02/2020 CLINICAL DATA:  Abdominal pain and diarrhea EXAM: ABDOMEN - 1 VIEW COMPARISON:  None. FINDINGS: The bowel gas pattern is normal. Surgical clips are seen in the right upper quadrant. No  radio-opaque calculi or other significant radiographic abnormality are seen. IMPRESSION: Negative. Electronically Signed   By: Prudencio Pair M.D.   On: 03/02/2020 20:25   Scheduled Meds: . dabigatran  150 mg Oral BID  . digoxin  125 mcg Oral Daily  . diltiazem  60 mg Oral Q8H  . DULoxetine  60 mg Oral Daily  . insulin aspart  0-15 Units Subcutaneous TID WC  . insulin glargine  40 Units Subcutaneous BID  . metoprolol tartrate  25 mg Oral BID  . pantoprazole  40 mg Oral BID  . potassium chloride  20 mEq Oral BID  . potassium chloride  40 mEq Oral BID  . rosuvastatin  10 mg Oral Daily   Continuous Infusions: . 0.45 % NaCl with KCl 20 mEq / L 75 mL/hr at 03/04/20 1534    LOS: 0 days   Kerney Elbe, DO [facility] PAGER is on AMION  If 7PM-7AM, please contact night-coverage [email or web address]

## 2020-03-04 NOTE — Progress Notes (Signed)
Patient stated that she is still having moments of nausea and would like to wait another night before trying the CPAP.  No distress noted at this time, will continue to monitor.

## 2020-03-04 NOTE — Plan of Care (Signed)
  Problem: Education: Goal: Knowledge of General Education information will improve Description: Including pain rating scale, medication(s)/side effects and non-pharmacologic comfort measures Outcome: Progressing   Problem: Activity: Goal: Risk for activity intolerance will decrease Outcome: Progressing   Problem: Nutrition: Goal: Adequate nutrition will be maintained Outcome: Progressing   

## 2020-03-04 NOTE — Progress Notes (Signed)
Inpatient Diabetes Program Recommendations  AACE/ADA: New Consensus Statement on Inpatient Glycemic Control (2015)  Target Ranges:  Prepandial:   less than 140 mg/dL      Peak postprandial:   less than 180 mg/dL (1-2 hours)      Critically ill patients:  140 - 180 mg/dL   Lab Results  Component Value Date   GLUCAP 151 (H) 03/04/2020   HGBA1C 10.0 (H) 03/02/2020    Review of Glycemic Control Results for [name], [name] (MRN [id number]) as of 03/04/2020 13:59  Ref. Range 03/03/2020 17:04 03/03/2020 21:07 03/04/2020 09:24 03/04/2020 12:05  Glucose-Capillary Latest Ref Range: 70 - 99 mg/dL 204 (H) 196 (H) 254 (H) 151 (H)   Diabetes history: Type 2 DM Outpatient Diabetes medications: Trulicity 1.5 mg Qwk, Levemir 50 units BID, Novolog 5 units TID Current orders for Inpatient glycemic control: Lantus 40 units BID, Novolog 0-15 units TID  Inpatient Diabetes Program Recommendations:   Spoke with patient regarding outpatient diabetes management. Verifies home medications and denies missing doses. Reviewed patient's current A1c of 10% down from 12.7% in 09/2019. Explained what a A1c is and what it measures. Also reviewed goal A1c with patient, importance of good glucose control @ home, and blood sugar goals. Reviewed patho of DM, need for insulin, role of pancreas, vascular changes, impact of infection and commorbidities.  Patient has a meter and testing supplies. Encouraged to check 2-3 times per day. Reviewed when to call MD and discussed occasionally checking post prandial readings. Plans to follow up with PCP in the next month. Denies drinking sugary beverages now and attempts to eat mostly vegetables. Patient states, "I have been trying to do better. My family has been on me to watch my carbohydrates." Reviewed plate method, how to read nutritional labels, and encouraged mindfulness.  Patient does not have any further questions at this time.   Thanks, Bronson Curb, MSN, RNC-OB Diabetes  Coordinator [phone] (8a-5p)

## 2020-03-05 LAB — COMPREHENSIVE METABOLIC PANEL
ALT: 12 U/L (ref 0–44)
AST: 16 U/L (ref 15–41)
Albumin: 2.7 g/dL — ABNORMAL LOW (ref 3.5–5.0)
Alkaline Phosphatase: 66 U/L (ref 38–126)
Anion gap: 9 (ref 5–15)
BUN: 8 mg/dL (ref 8–23)
CO2: 28 mmol/L (ref 22–32)
Calcium: 8.5 mg/dL — ABNORMAL LOW (ref 8.9–10.3)
Chloride: 98 mmol/L (ref 98–111)
Creatinine, Ser: 1.46 mg/dL — ABNORMAL HIGH (ref 0.44–1.00)
GFR calc Af Amer: 44 mL/min — ABNORMAL LOW (ref >60)
GFR calc non Af Amer: 38 mL/min — ABNORMAL LOW (ref >60)
Glucose, Bld: 129 mg/dL — ABNORMAL HIGH (ref 70–99)
Potassium: 3.5 mmol/L (ref 3.5–5.1)
Sodium: 135 mmol/L (ref 135–145)
Total Bilirubin: 0.8 mg/dL (ref 0.3–1.2)
Total Protein: 6.6 g/dL (ref 6.5–8.1)

## 2020-03-05 LAB — GLUCOSE, CAPILLARY
Glucose-Capillary: 115 mg/dL — ABNORMAL HIGH (ref 70–99)
Glucose-Capillary: 222 mg/dL — ABNORMAL HIGH (ref 70–99)
Glucose-Capillary: 191 mg/dL — ABNORMAL HIGH (ref 70–99)
Glucose-Capillary: 180 mg/dL — ABNORMAL HIGH (ref 70–99)

## 2020-03-05 LAB — CBC WITH DIFFERENTIAL/PLATELET
Abs Immature Granulocytes: 0.05 10*3/uL (ref 0.00–0.07)
Basophils Absolute: 0 10*3/uL (ref 0.0–0.1)
Basophils Relative: 0 %
Eosinophils Absolute: 0.2 10*3/uL (ref 0.0–0.5)
Eosinophils Relative: 2 %
HCT: 34.7 % — ABNORMAL LOW (ref 36.0–46.0)
Hemoglobin: 11.6 g/dL — ABNORMAL LOW (ref 12.0–15.0)
Immature Granulocytes: 1 %
Lymphocytes Relative: 24 %
Lymphs Abs: 2.6 10*3/uL (ref 0.7–4.0)
MCH: 29.5 pg (ref 26.0–34.0)
MCHC: 33.4 g/dL (ref 30.0–36.0)
MCV: 88.3 fL (ref 80.0–100.0)
Monocytes Absolute: 0.9 10*3/uL (ref 0.1–1.0)
Monocytes Relative: 9 %
Neutro Abs: 7 10*3/uL (ref 1.7–7.7)
Neutrophils Relative %: 64 %
Platelets: 217 10*3/uL (ref 150–400)
RBC: 3.93 MIL/uL (ref 3.87–5.11)
RDW: 13.5 % (ref 11.5–15.5)
WBC: 10.8 10*3/uL — ABNORMAL HIGH (ref 4.0–10.5)
nRBC: 0 % (ref 0.0–0.2)

## 2020-03-05 LAB — MAGNESIUM: Magnesium: 1.9 mg/dL (ref 1.7–2.4)

## 2020-03-05 LAB — PHOSPHORUS: Phosphorus: 3.1 mg/dL (ref 2.5–4.6)

## 2020-03-05 MED ORDER — POTASSIUM CHLORIDE IN NACL 20-0.45 MEQ/L-% IV SOLN
INTRAVENOUS | Status: AC
  Filled 2020-03-05 (×2): qty 1000

## 2020-03-05 MED ORDER — METOCLOPRAMIDE HCL 5 MG/ML IJ SOLN
10.0000 mg | Freq: Three times a day (TID) | INTRAMUSCULAR | Status: AC
  Administered 2020-03-05 – 2020-03-06 (×6): 10 mg via INTRAVENOUS
  Filled 2020-03-05 (×6): qty 2

## 2020-03-05 NOTE — Discharge Instructions (Signed)
Hypoglycemia Hypoglycemia is when the sugar (glucose) level in your blood is too low. Signs of low blood sugar may include:  Feeling: ? Hungry. ? Worried or nervous (anxious). ? Sweaty and clammy. ? Confused. ? Dizzy. ? Sleepy. ? Sick to your stomach (nauseous).  Having: ? A fast heartbeat. ? A headache. ? A change in your vision. ? Tingling or no feeling (numbness) around your mouth, lips, or tongue. ? Jerky movements that you cannot control (seizure).  Having trouble with: ? Moving (coordination). ? Sleeping. ? Passing out (fainting). ? Getting upset easily (irritability). Low blood sugar can happen to people who have diabetes and people who do not have diabetes. Low blood sugar can happen quickly, and it can be an emergency. Treating low blood sugar Low blood sugar is often treated by eating or drinking something sugary right away, such as:  Fruit juice, 4-6 oz (120-150 mL).  Regular soda (not diet soda), 4-6 oz (120-150 mL).  Low-fat milk, 4 oz (120 mL).  Several pieces of hard candy.  Sugar or honey, 1 Tbsp (15 mL). Treating low blood sugar if you have diabetes If you can think clearly and swallow safely, follow the 15:15 rule:  Take 15 grams of a fast-acting carb (carbohydrate). Talk with your doctor about how much you should take.  Always keep a source of fast-acting carb with you, such as: ? Sugar tablets (glucose pills). Take 3-4 pills. ? 6-8 pieces of hard candy. ? 4-6 oz (120-150 mL) of fruit juice. ? 4-6 oz (120-150 mL) of regular (not diet) soda. ? 1 Tbsp (15 mL) honey or sugar.  Check your blood sugar 15 minutes after you take the carb.  If your blood sugar is still at or below 70 mg/dL (3.9 mmol/L), take 15 grams of a carb again.  If your blood sugar does not go above 70 mg/dL (3.9 mmol/L) after 3 tries, get help right away.  After your blood sugar goes back to normal, eat a meal or a snack within 1 hour.  Treating very low blood sugar If your  blood sugar is at or below 54 mg/dL (3 mmol/L), you have very low blood sugar (severe hypoglycemia). This may also cause:  Passing out.  Jerky movements you cannot control (seizure).  Losing consciousness (coma). This is an emergency. Do not wait to see if the symptoms will go away. Get medical help right away. Call your local emergency services (911 in the [place] Do not drive yourself to the hospital. If you have very low blood sugar and you cannot eat or drink, you may need a glucagon shot (injection). A family member or friend should learn how to check your blood sugar and how to give you a glucagon shot. Ask your doctor if you need to have a glucagon shot kit at home. Follow these instructions at home: General instructions  Take over-the-counter and prescription medicines only as told by your doctor.  Stay aware of your blood sugar as told by your doctor.  Limit alcohol intake to no more than 1 drink a day for nonpregnant women and 2 drinks a day for men. One drink equals 12 oz of beer (355 mL), 5 oz of wine (148 mL), or 1 oz of hard liquor (44 mL).  Keep all follow-up visits as told by your doctor. This is important. If you have diabetes:   Follow your diabetes care plan as told by your doctor. Make sure you: ? Know the signs of low blood sugar. ?  Take your medicines as told. ? Follow your exercise and meal plan. ? Eat on time. Do not skip meals. ? Check your blood sugar as often as told by your doctor. Always check it before and after exercise. ? Follow your sick day plan when you cannot eat or drink normally. Make this plan ahead of time with your doctor.  Share your diabetes care plan with: ? Your work or school. ? People you live with.  Check your pee (urine) for ketones: ? When you are sick. ? As told by your doctor.  Carry a card or wear jewelry that says you have diabetes. Contact a doctor if:  You have trouble keeping your blood sugar in your target  range.  You have low blood sugar often. Get help right away if:  You still have symptoms after you eat or drink something sugary.  Your blood sugar is at or below 54 mg/dL (3 mmol/L).  You have jerky movements that you cannot control.  You pass out. These symptoms may be an emergency. Do not wait to see if the symptoms will go away. Get medical help right away. Call your local emergency services (911 in the [place] Do not drive yourself to the hospital. Summary  Hypoglycemia happens when the level of sugar (glucose) in your blood is too low.  Low blood sugar can happen to people who have diabetes and people who do not have diabetes. Low blood sugar can happen quickly, and it can be an emergency.  Make sure you know the signs of low blood sugar and know how to treat it.  Always keep a source of sugar (fast-acting carb) with you to treat low blood sugar. This information is not intended to replace advice given to you by your health care provider. Make sure you discuss any questions you have with your health care provider. Document Revised: 11/28/2018 Document Reviewed: 09/10/2015 Elsevier Patient Education  [place]. Hyperglycemia Hyperglycemia occurs when the level of sugar (glucose) in the blood is too high. Glucose is a type of sugar that provides the body's main source of energy. Certain hormones (insulin and glucagon) control the level of glucose in the blood. Insulin lowers blood glucose, and glucagon increases blood glucose. Hyperglycemia can result from having too little insulin in the bloodstream, or from the body not responding normally to insulin. Hyperglycemia occurs most often in people who have diabetes (diabetes mellitus), but it can happen in people who do not have diabetes. It can develop quickly, and it can be life-threatening if it causes you to become severely dehydrated (diabetic ketoacidosis or hyperglycemic hyperosmolar state). Severe hyperglycemia is a  medical emergency. What are the causes? If you have diabetes, hyperglycemia may be caused by:  Diabetes medicine.  Medicines that increase blood glucose or affect your diabetes control.  Not eating enough, or not eating often enough.  Changes in physical activity level.  Being sick or having an infection. If you have prediabetes or undiagnosed diabetes:  Hyperglycemia may be caused by those conditions. If you do not have diabetes, hyperglycemia may be caused by:  Certain medicines, including steroid medicines, beta-blockers, epinephrine, and thiazide diuretics.  Stress.  Serious illness.  Surgery.  Diseases of the pancreas.  Infection. What increases the risk? Hyperglycemia is more likely to develop in people who have risk factors for diabetes, such as:  Having a family member with diabetes.  Having a gene for type 1 diabetes that is passed from parent to child (inherited).  Living in an area with cold weather conditions.  Exposure to certain viruses.  Certain conditions in which the body's disease-fighting (immune) system attacks itself (autoimmune disorders).  Being overweight or obese.  Having an inactive (sedentary) lifestyle.  Having been diagnosed with insulin resistance.  Having a history of prediabetes, gestational diabetes, or polycystic ovarian syndrome (PCOS).  Being of American-Indian, African-American, Hispanic/Latino, or Asian/Pacific Islander descent. What are the signs or symptoms? Hyperglycemia may not cause any symptoms. If you do have symptoms, they may include early warning signs, such as:  Increased thirst.  Hunger.  Feeling very tired.  Needing to urinate more often than usual.  Blurry vision. Other symptoms may develop if hyperglycemia gets worse, such as:  Dry mouth.  Loss of appetite.  Fruity-smelling breath.  Weakness.  Unexpected or rapid weight gain or weight loss.  Tingling or numbness in the hands or  feet.  Headache.  Skin that does not quickly return to normal after being lightly pinched and released (poor skin turgor).  Abdominal pain.  Cuts or bruises that are slow to heal. How is this diagnosed? Hyperglycemia is diagnosed with a blood test to measure your blood glucose level. This blood test is usually done while you are having symptoms. Your health care provider may also do a physical exam and review your medical history. You may have more tests to determine the cause of your hyperglycemia, such as:  A fasting blood glucose (FBG) test. You will not be allowed to eat (you will fast) for at least 8 hours before a blood sample is taken.  An A1c (hemoglobin A1c) blood test. This provides information about blood glucose control over the previous 2-3 months.  An oral glucose tolerance test (OGTT). This measures your blood glucose at two times: ? After fasting. This is your baseline blood glucose level. ? Two hours after drinking a beverage that contains glucose. How is this treated? Treatment depends on the cause of your hyperglycemia. Treatment may include:  Taking medicine to regulate your blood glucose levels. If you take insulin or other diabetes medicines, your medicine or dosage may be adjusted.  Lifestyle changes, such as exercising more, eating healthier foods, or losing weight.  Treating an illness or infection, if this caused your hyperglycemia.  Checking your blood glucose more often.  Stopping or reducing steroid medicines, if these caused your hyperglycemia. If your hyperglycemia becomes severe and it results in hyperglycemic hyperosmolar state, you must be hospitalized and given IV fluids. Follow these instructions at home:  General instructions  Take over-the-counter and prescription medicines only as told by your health care provider.  Do not use any products that contain nicotine or tobacco, such as cigarettes and e-cigarettes. If you need help quitting, ask  your health care provider.  Limit alcohol intake to no more than 1 drink per day for nonpregnant women and 2 drinks per day for men. One drink equals 12 oz of beer, 5 oz of wine, or 1 oz of hard liquor.  Learn to manage stress. If you need help with this, ask your health care provider.  Keep all follow-up visits as told by your health care provider. This is important. Eating and drinking   Maintain a healthy weight.  Exercise regularly, as directed by your health care provider.  Stay hydrated, especially when you exercise, get sick, or spend time in hot temperatures.  Eat healthy foods, such as: ? Lean proteins. ? Complex carbohydrates. ? Fresh fruits and vegetables. ? Low-fat dairy products. ?  Healthy fats.  Drink enough fluid to keep your urine clear or pale yellow. If you have diabetes:  Make sure you know the symptoms of hyperglycemia.  Follow your diabetes management plan, as told by your health care provider. Make sure you: ? Take your insulin and medicines as directed. ? Follow your exercise plan. ? Follow your meal plan. Eat on time, and do not skip meals. ? Check your blood glucose as often as directed. Make sure to check your blood glucose before and after exercise. If you exercise longer or in a different way than usual, check your blood glucose more often. ? Follow your sick day plan whenever you cannot eat or drink normally. Make this plan in advance with your health care provider.  Share your diabetes management plan with people in your workplace, school, and household.  Check your urine for ketones when you are ill and as told by your health care provider.  Carry a medical alert card or wear medical alert jewelry. Contact a health care provider if:  Your blood glucose is at or above 240 mg/dL (13.3 mmol/L) for 2 days in a row.  You have problems keeping your blood glucose in your target range.  You have frequent episodes of hyperglycemia. Get help right  away if:  You have difficulty breathing.  You have a change in how you think, feel, or act (mental status).  You have nausea or vomiting that does not go away. These symptoms may represent a serious problem that is an emergency. Do not wait to see if the symptoms will go away. Get medical help right away. Call your local emergency services (911 in the [place] Do not drive yourself to the hospital. Summary  Hyperglycemia occurs when the level of sugar (glucose) in the blood is too high.  Hyperglycemia is diagnosed with a blood test to measure your blood glucose level. This blood test is usually done while you are having symptoms. Your health care provider may also do a physical exam and review your medical history.  If you have diabetes, follow your diabetes management plan as told by your health care provider.  Contact your health care provider if you have problems keeping your blood glucose in your target range. This information is not intended to replace advice given to you by your health care provider. Make sure you discuss any questions you have with your health care provider. Document Revised: 04/24/2016 Document Reviewed: 04/24/2016 Elsevier Patient Education  [place]. Hemoglobin A1c Test Why am I having this test? You may have the hemoglobin A1c test (HbA1c test) done to:  Evaluate your risk for developing diabetes (diabetes mellitus).  Diagnose diabetes.  Monitor long-term control of blood sugar (glucose) in people who have diabetes and help make treatment decisions. This test may be done with other blood glucose tests, such as fasting blood glucose and oral glucose tolerance tests. What is being tested? Hemoglobin is a type of protein in the blood that carries oxygen. Glucose attaches to hemoglobin to form glycated hemoglobin. This test checks the amount of glycated hemoglobin in your blood, which is a good indicator of the average amount of glucose in your blood  during the past 2-3 months. What kind of sample is taken?  A blood sample is required for this test. It is usually collected by inserting a needle into a blood vessel. Tell a health care provider about:  All medicines you are taking, including vitamins, herbs, eye drops, creams, and over-the-counter medicines.  Any blood disorders you have.  Any surgeries you have had.  Any medical conditions you have.  Whether you are pregnant or may be pregnant. How are the results reported? Your results will be reported as a percentage that indicates how much of your hemoglobin has glucose attached to it (is glycated). Your health care provider will compare your results to normal ranges that were established after testing a large group of people (reference ranges). Reference ranges may vary among labs and hospitals. For this test, common reference ranges are:  Adult or child without diabetes: 4-5.6%.  Adult or child with diabetes and good blood glucose control: less than 7%. What do the results mean? If you have diabetes:  A result of less than 7% is considered normal, meaning that your blood glucose is well controlled.  A result higher than 7% means that your blood glucose is not well controlled, and your treatment plan may need to be adjusted. If you do not have diabetes:  A result within the reference range is considered normal, meaning that you are not at high risk for diabetes.  A result of 5.7-6.4% means that you have a high risk of developing diabetes, and you may have prediabetes. Prediabetes is the condition of having a blood glucose level that is higher than it should be, but not high enough for you to be diagnosed with diabetes. Having prediabetes puts you at risk for developing type 2 diabetes (type 2 diabetes mellitus). You may have more tests, including a repeat HbA1c test.  Results of 6.5% or higher on two separate HbA1c tests mean that you have diabetes. You may have more tests to  confirm the diagnosis. Abnormally low HbA1c values may be caused by:  Pregnancy.  Severe blood loss.  Receiving donated blood (transfusions).  Low red blood cell count (anemia).  Long-term kidney failure.  Some unusual forms (variants) of hemoglobin. Talk with your health care provider about what your results mean. Questions to ask your health care provider Ask your health care provider, or the department that is doing the test:  When will my results be ready?  How will I get my results?  What are my treatment options?  What other tests do I need?  What are my next steps? Summary  The hemoglobin A1c test (HbA1c test) may be done to evaluate your risk for developing diabetes, to diagnose diabetes, and to monitor long-term control of blood sugar (glucose) in people who have diabetes and help make treatment decisions.  Hemoglobin is a type of protein in the blood that carries oxygen. Glucose attaches to hemoglobin to form glycated hemoglobin. This test checks the amount of glycated hemoglobin in your blood, which is a good indicator of the average amount of glucose in your blood during the past 2-3 months.  Talk with your health care provider about what your results mean. This information is not intended to replace advice given to you by your health care provider. Make sure you discuss any questions you have with your health care provider. Document Revised: 07/20/2017 Document Reviewed: 03/20/2017 Elsevier Patient Education  [place].   ========================================================================================================  Information on my medicine - Pradaxa (dabigatran)  Why was Pradaxa prescribed for you? Pradaxa was prescribed for you to reduce the risk of forming blood clots that cause a stroke if you have a medical condition called atrial fibrillation (a type of irregular heartbeat).    What do you Need to know about PradAXa? Take your  Pradaxa TWICE DAILY -  one capsule in the morning and one tablet in the evening with or without food.  It would be best to take the doses about the same time each day.  The capsules should not be broken, chewed or opened - they must be swallowed whole.  Do not store Pradaxa in other medication containers - once the bottle is opened the Pradaxa should be used within FOUR months; throw away any capsules that haven't been by that time.  Take Pradaxa exactly as prescribed by your doctor.  DO NOT stop taking Pradaxa without talking to the doctor who prescribed the medication.  Stopping without other stroke prevention medication to take the place of Pradaxa may increase your risk of developing a clot that causes a stroke.  Refill your prescription before you run out.  After discharge, you should have regular check-up appointments with your healthcare provider that is prescribing your Pradaxa.  In the future your dose may need to be changed if your kidney function or weight changes by a significant amount.  What do you do if you miss a dose? If you miss a dose, take it as soon as you remember on the same day.  If your next dose is less than 6 hours away, skip the missed dose.  Do not take two doses of PRADAXA at the same time.  Important Safety Information A possible side effect of Pradaxa is bleeding. You should call your healthcare provider right away if you experience any of the following: ? Bleeding from an injury or your nose that does not stop. ? Unusual colored urine (red or dark brown) or unusual colored stools (red or black). ? Unusual bruising for unknown reasons. ? A serious fall or if you hit your head (even if there is no bleeding).  Some medicines may interact with Pradaxa and might increase your risk of bleeding or clotting while on Pradaxa. To help avoid this, consult your healthcare provider or pharmacist prior to using any new prescription or non-prescription medications,  including herbals, vitamins, non-steroidal anti-inflammatory drugs (NSAIDs) and supplements.  This website has more information on Pradaxa (dabigatran): [email or web address]

## 2020-03-05 NOTE — Consult Note (Signed)
Reason for Consult: Nausea, vomiting, and diarrhea Referring Physician: [facility]  Bobby [name] HPI: This is a 64 year old female with a PMH of DM, gastroparesis, COPD, CHF, HTN, and obesity admitted earlier this week for persistent nausea, vomiting, and diarrhea.  She reports eating an [employer] roast beef sandwich last Friday and shortly afterwards she started to experience projectile vomiting.  She typically has a trash can by her bed and she quickly needed to vomit forcefully into the receptacle.  Her symptoms were accompanied by diarrhea and her symptoms continued to persist.  She denies any fever and on Tuesday she felt that she needed further evaluation.  Upon admission she was noted to be dehydrated and her creatinine was increased up to 1.7, but it has declined down with IV hydration to 1.4.  Since being admitted she denies any improvement in her symptoms.  The stool studies are negative for C diff or any other infectious issues.  A GES was performed on 12/09/2009 showing that she had gastroparesis, but it was only a two hour study.  Past Medical History:  Diagnosis Date  . Allergic rhinitis   . Arthritis   . Asthma   . Chest pain 12/27/2017  . Chronic diastolic CHF (congestive heart failure) ([place])   . COPD (chronic obstructive pulmonary disease) ([place])   . Depression   . DM (diabetes mellitus) ([place])   . DVT (deep vein thrombosis) in pregnancy   . Dysrhythmia    atrial fibrilation  . GERD (gastroesophageal reflux disease)   . Headache(784.0)   . HTN (hypertension)   . Hyperlipidemia   . Obesity   . Pneumonia 04/2018   RIGHT LOBE  . Shortness of breath   . Sleep apnea    compliant with CPAP    Past Surgical History:  Procedure Laterality Date  . ABDOMINAL HYSTERECTOMY    . CATARACT EXTRACTION, BILATERAL    . CHOLECYSTECTOMY    . COLONOSCOPY WITH PROPOFOL N/A 03/05/2015   Procedure: COLONOSCOPY WITH PROPOFOL;  Surgeon: Carol Ada, MD;  Location: [facility];   Service: Endoscopy;  Laterality: N/A;  . DIAGNOSTIC LAPAROSCOPY    . ingrown hallux Left   . KNEE SURGERY    . LEFT HEART CATH AND CORONARY ANGIOGRAPHY N/A 12/31/2017   Procedure: LEFT HEART CATH AND CORONARY ANGIOGRAPHY;  Surgeon: Charolette Forward, MD;  Location: [facility];  Service: Cardiovascular;  Laterality: N/A;    Family History  Problem Relation Age of Onset  . Cancer Mother   . Hypertension Mother   . Cancer Father   . CAD Other   . Hypertension Sister   . Hypertension Brother     Social History:  reports that she has never smoked. She has never used smokeless tobacco. She reports that she does not drink alcohol and does not use drugs.  Allergies:  Allergies  Allergen Reactions  . Sulfa Antibiotics Itching    Medications:  Scheduled: . dabigatran  150 mg Oral BID  . digoxin  125 mcg Oral Daily  . diltiazem  60 mg Oral Q8H  . DULoxetine  60 mg Oral Daily  . insulin aspart  0-15 Units Subcutaneous TID WC  . insulin glargine  40 Units Subcutaneous BID  . metoCLOPramide (REGLAN) injection  10 mg Intravenous Q8H  . metoprolol tartrate  25 mg Oral BID  . pantoprazole  40 mg Oral BID  . potassium chloride  20 mEq Oral BID  . rosuvastatin  10 mg Oral Daily  Continuous:   Results for orders placed or performed during the hospital encounter of 03/02/20 (from the past 24 hour(s))  Glucose, capillary     Status: Abnormal   Collection Time: 03/04/20  9:24 AM  Result Value Ref Range   Glucose-Capillary 254 (H) 70 - 99 mg/dL  Gastrointestinal Panel by PCR , Stool     Status: None   Collection Time: 03/04/20 10:04 AM   Specimen: STOOL  Result Value Ref Range   Campylobacter species NOT DETECTED NOT DETECTED   Plesimonas shigelloides NOT DETECTED NOT DETECTED   Salmonella species NOT DETECTED NOT DETECTED   Yersinia enterocolitica NOT DETECTED NOT DETECTED   Vibrio species NOT DETECTED NOT DETECTED   Vibrio cholerae NOT DETECTED NOT DETECTED    Enteroaggregative E coli (EAEC) NOT DETECTED NOT DETECTED   Enteropathogenic E coli (EPEC) NOT DETECTED NOT DETECTED   Enterotoxigenic E coli (ETEC) NOT DETECTED NOT DETECTED   Shiga like toxin producing E coli (STEC) NOT DETECTED NOT DETECTED   Shigella/Enteroinvasive E coli (EIEC) NOT DETECTED NOT DETECTED   Cryptosporidium NOT DETECTED NOT DETECTED   Cyclospora cayetanensis NOT DETECTED NOT DETECTED   Entamoeba histolytica NOT DETECTED NOT DETECTED   Giardia lamblia NOT DETECTED NOT DETECTED   Adenovirus F40/41 NOT DETECTED NOT DETECTED   Astrovirus NOT DETECTED NOT DETECTED   Norovirus GI/GII NOT DETECTED NOT DETECTED   Rotavirus A NOT DETECTED NOT DETECTED   Sapovirus (I, II, IV, and V) NOT DETECTED NOT DETECTED  C Difficile Quick Screen w PCR reflex     Status: None   Collection Time: 03/04/20 10:04 AM   Specimen: STOOL  Result Value Ref Range   C Diff antigen NEGATIVE NEGATIVE   C Diff toxin NEGATIVE NEGATIVE   C Diff interpretation No C. difficile detected.   Occult blood card to lab, stool     Status: None   Collection Time: 03/04/20 10:04 AM  Result Value Ref Range   Fecal Occult Bld NEGATIVE NEGATIVE  Glucose, capillary     Status: Abnormal   Collection Time: 03/04/20 12:05 PM  Result Value Ref Range   Glucose-Capillary 151 (H) 70 - 99 mg/dL  Glucose, capillary     Status: Abnormal   Collection Time: 03/04/20  4:26 PM  Result Value Ref Range   Glucose-Capillary 134 (H) 70 - 99 mg/dL  Glucose, capillary     Status: Abnormal   Collection Time: 03/04/20  8:55 PM  Result Value Ref Range   Glucose-Capillary 147 (H) 70 - 99 mg/dL  CBC with Differential/Platelet     Status: Abnormal   Collection Time: 03/05/20  2:43 AM  Result Value Ref Range   WBC 10.8 (H) 4.0 - 10.5 K/uL   RBC 3.93 3.87 - 5.11 MIL/uL   Hemoglobin 11.6 (L) 12.0 - 15.0 g/dL   HCT 34.7 (L) 36 - 46 %   MCV 88.3 80.0 - 100.0 fL   MCH 29.5 26.0 - 34.0 pg   MCHC 33.4 30.0 - 36.0 g/dL   RDW 13.5 11.5 -  15.5 %   Platelets 217 150 - 400 K/uL   nRBC 0.0 0.0 - 0.2 %   Neutrophils Relative % 64 %   Neutro Abs 7.0 1.7 - 7.7 K/uL   Lymphocytes Relative 24 %   Lymphs Abs 2.6 0.7 - 4.0 K/uL   Monocytes Relative 9 %   Monocytes Absolute 0.9 0 - 1 K/uL   Eosinophils Relative 2 %   Eosinophils Absolute 0.2 0 -  0 K/uL   Basophils Relative 0 %   Basophils Absolute 0.0 0 - 0 K/uL   Immature Granulocytes 1 %   Abs Immature Granulocytes 0.05 0.00 - 0.07 K/uL  Comprehensive metabolic panel     Status: Abnormal   Collection Time: 03/05/20  2:43 AM  Result Value Ref Range   Sodium 135 135 - 145 mmol/L   Potassium 3.5 3.5 - 5.1 mmol/L   Chloride 98 98 - 111 mmol/L   CO2 28 22 - 32 mmol/L   Glucose, Bld 129 (H) 70 - 99 mg/dL   BUN 8 8 - 23 mg/dL   Creatinine, Ser 1.46 (H) 0.44 - 1.00 mg/dL   Calcium 8.5 (L) 8.9 - 10.3 mg/dL   Total Protein 6.6 6.5 - 8.1 g/dL   Albumin 2.7 (L) 3.5 - 5.0 g/dL   AST 16 15 - 41 U/L   ALT 12 0 - 44 U/L   Alkaline Phosphatase 66 38 - 126 U/L   Total Bilirubin 0.8 0.3 - 1.2 mg/dL   GFR calc non Af Amer 38 (L) >60 mL/min   GFR calc Af Amer 44 (L) >60 mL/min   Anion gap 9 5 - 15  Phosphorus     Status: None   Collection Time: 03/05/20  2:43 AM  Result Value Ref Range   Phosphorus 3.1 2.5 - 4.6 mg/dL  Magnesium     Status: None   Collection Time: 03/05/20  2:43 AM  Result Value Ref Range   Magnesium 1.9 1.7 - 2.4 mg/dL     CT ABDOMEN PELVIS WO CONTRAST  Result Date: 03/04/2020 CLINICAL DATA:  Acute gastroenteritis EXAM: CT ABDOMEN AND PELVIS WITHOUT CONTRAST TECHNIQUE: Multidetector CT imaging of the abdomen and pelvis was performed following the standard protocol without IV contrast. COMPARISON:  03/02/2020, 01/30/2020 FINDINGS: Lower chest: No acute pleural or parenchymal lung disease. The heart is enlarged but stable. Hepatobiliary: Choose 2 Pancreas: Unremarkable. No pancreatic ductal dilatation or surrounding inflammatory changes. Spleen: Normal in size without  focal abnormality. Adrenals/Urinary Tract: Adrenal glands are unremarkable. Kidneys are normal, without renal calculi, focal lesion, or hydronephrosis. Bladder is unremarkable. Stomach/Bowel: No bowel obstruction or ileus. Normal appendix right lower quadrant. No bowel wall thickening or inflammatory change. Vascular/Lymphatic: Aortic atherosclerosis. Stable left renal artery aneurysms. No enlarged abdominal or pelvic lymph nodes. Reproductive: Status post hysterectomy. No adnexal masses. Other: No abdominal wall hernia or abnormality. No abdominopelvic ascites. Musculoskeletal: No acute or destructive bony lesions. Reconstructed images demonstrate no additional findings. IMPRESSION: No acute intra-abdominal or intrapelvic process. Electronically Signed   By: Randa Ngo M.D.   On: 03/04/2020 16:27    ROS:  As stated above in the HPI otherwise negative.  Blood pressure 116/85, pulse 77, temperature (!) 97.4 F (36.3 C), temperature source Oral, resp. rate 16, weight 121.5 kg, SpO2 100 %.    PE: Gen: NAD, Alert and Oriented, fatigued appearing HEENT:  [place]/AT, EOMI Neck: Supple, no LAD Lungs: CTA Bilaterally CV: RRR without M/G/R ABD: Soft, NTND, +BS Ext: No C/C/E  Assessment/Plan: 1) Nausea/Vomiting. 2) Diarrhea. 3) History of gastroparesis. 4) Multiple medical problems.   Prior to my arrival this AM she vomiting and she was feeling unwell.  It appears that she suffered from food poisoning and her symptoms persist.  It may be that the food poisoning induced a cascade of effects with the potential of exacerbating her gastroparesis.  She still continues with diarrhea.  The CT scan yesterday was negative for any evidence of  acute intra-abdominal abnormalities.  Currently there are no specific interventions that come to mind to aid in her recovery.  It seems that her current issues will be best managed with supportive care.  Plan: 1) Continue with IV hydration. 2) Continue with  antiemetics. 3) Of note, her metoclopramide may be contributing to her current diarrhea.  Her symptoms will need to be monitored.  Lion Fernandez D 03/05/2020, 7:35 AM

## 2020-03-05 NOTE — Plan of Care (Signed)
  Problem: Education: Goal: Knowledge of General Education information will improve Description: Including pain rating scale, medication(s)/side effects and non-pharmacologic comfort measures Outcome: Progressing   Problem: Health Behavior/Discharge Planning: Goal: Ability to manage health-related needs will improve Outcome: Progressing   Problem: Activity: Goal: Risk for activity intolerance will decrease Outcome: Progressing   Problem: Nutrition: Goal: Adequate nutrition will be maintained Outcome: Progressing   Problem: Elimination: Goal: Will not experience complications related to bowel motility Outcome: Progressing Goal: Will not experience complications related to urinary retention Outcome: Progressing   

## 2020-03-05 NOTE — Progress Notes (Signed)
PROGRESS NOTE    [name]  PRF:[id number] DOB: 03-12-1956 DOA: 03/02/2020 PCP: [name], NP  Brief Narrative: HPI per [name] on 03/02/20 [name] is a 64 y.o. female with medical history significant of hypertension, diabetes mellitus type 2, insulin-dependent, hyperlipidemia, paroxysmal atrial fibrillation, chronic congestive heart failure with reduced ejection fraction (45-50% 06/2018), chronic obstructive pulmonary disease, obstructive sleep apnea on nightly CPAP, morbid obesity, chronic kidney disease stage III, with baseline creatinine 1.6, gastroesophageal reflux disease who came to hospital for evaluation of persistent nausea vomiting with diarrhea.  Patient stated that her symptoms started on Friday, after she had a roast beef sandwich.  Her symptoms include multiple episode of nausea with vomiting as well as diarrhea per day.  She described at times dark vomiting mixed with greenish vomiting, and similarly dark-colored diarrhea.  She denied having any bright red blood per rectum.  She reported diffuse abdominal pain, about 8/10, cramping of character, intermittent.  She had similar symptoms about a month ago, which resolved spontaneously, although there were not as prominent as today.  Denies any recent travel, and similar symptoms in any family members.  Due to symptoms she was unable to take all her medications, including medication for control of heart rate.  ED Course:  In the ED patient was found to be afebrile temperature 98.4 F.  Blood pressure was stable 117/94, however her heart rate was elevated to 113/min, with rhythm consistent with atrial fibrillation.  Patient was not hypoxic. CBC shows no leukocytosis or anemia, Hemoccult 14.3. Chemistry was positive for elevated creatinine 1.7, as well as mild hypokalemia 2.8 with hyponatremia 136.  Glucose 259, without evidence of DKA.  Noted negative troponin x2 and proBNP of 43.  Lipase and  LFTs within normal limits.  Chest x-ray showed no acute cardiopulmonary abnormalities.  Abdominal x-ray negative for acute finding.  Patient had previous CT abdomen pelvis on 6/11, with no acute findings. Patient was treated with oral Cardizem, metoprolol, given pantoprazole, IV potassium replacement and IV fluids, as well as pain control with IV morphine. Patient will be admitted to medical telemetry for close monitoring and further treatment  **Interim History  03/04/20 She states that her nausea is improving somewhat but continues have some diarrhea now is having some crampy abdominal pain.  Continues to have some abdominal pain.  States that she ate [employer] last week.  Continues to get IV fluid resuscitation because she is not improving will obtain a CT of the abdomen pelvis without contrast given her renal function.  We will discussed the case with [name] if she fails to improve we will get his consultation formally.  03/05/20  Continues to feel generally unwell and vomited this morning.  Continues to have diarrheal episodes and complains of abdominal pain.  CT scan of the abdomen pelvis and showed no acute intra-abdominal pathology.  GI feels that this may be prolonged food poisoning inducing a cascade of affect with potential exacerbation of her gastroparesis.  We will continue supportive care with IV hydration and continue with antiemetics.  Of note we started metoclopramide this morning scheduled however [name]  feels that it may be contributing to her diarrhea but it was just started this morning   Assessment & Plan:   Principal Problem:   Acute gastroenteritis Active Problems:   Morbid obesity (HCC)   OSA (obstructive sleep apnea)   Nausea vomiting and diarrhea   Diabetes mellitus type 2 in obese [facility])   Atrial fibrillation  with RVR ([place])   Congestive heart failure with left ventricular diastolic dysfunction, acute (HCC)  Nausea vomiting and diarrhea Acute  gastroenteritis versus diabetic gastroparesis or a combination of both, -Patient has ongoing symptoms concerning for either acute gastroenteritis or diabetic gastroparesis.  She started having symptoms last Friday after she ate at [employer] and this is persistent -Patient had vomiting this morning and continues to have some diarrhea -Obtain stool studies including C. difficile toxin as well as GI panel.  C. difficile was negative and the GI pathogen panel is also negative -Resumed IV fluids again today and will continue supportive care,  -Continue with antiemetics and analgesics.  Continue pantoprazole. -WBC went from 10.3 -> 12.0 -> 10.7 -> 10.8 -Continue supportive care and will obtain a CT of the abdomen and pelvis without contrast -CT Scan of the abdomen pelvis showed "No acute intra-abdominal or intrapelvic process." -Continue to monitor closely -Started the patient on IV metoclopramide scheduled 10 mg every 8 hours -Ensure blood sugars remain under 200  Atrial fibrillation with rapid ventricular response, improved and not tachycardic -Heart rate was poorly controlled, as patient missed her medications. -Resumed diltiazem, metoprolol and digoxin.  Resume anticoagulation with Pradaxa. -Currently patient does not require any IV drip for control of her abnormal heart rate. -Continue to monitor on telemetry carefully   Chronic kidney disease stage IIIb, improving -Renal function seems to be close to baseline, creatinine 1.6. -Patient has no signs of volume overload hence continue with IV fluids, with potassium content. -Holding diuretics for now and may resume in the a.m. possibly but will hold for now given that she continues to have some diarrhea -We will resume IV fluids given her persistent diarrhea -Patient's BUNs/creatinine on from 15/1.74 on admission and is now 8/1.46 -Avoid nephrotoxic medications, contrast dyes, hypotension and renally adjust medications  -Repeat CMP in  a.m.  Diabetes mellitus type 2, poorly controlled -Patient currently has no evidence of DKA or hyperosmolar state. -Resume insulin regimen, with reduced dose, Lantus 40 units twice daily, with sliding scale coverage. -Resume carbohydrate consistent diet. -CBGs ranging from 115-254 -HbA1c is 10.0 -She will ll need diabetes education coordinator evaluation for further recommendations  Hypokalemia -Patient's potassium this morning was 3.5 -In the setting of diarrhea -Replete with p.o. potassium chloride -Continue to monitor and replete as necessary -Repeat CMP in a.m.  Chronic congestive heart failure with reduced ejection fraction 45 to 50% -Patient currently without signs of volume overload, no edema, clear chest x-ray. -Given signs of dehydration, holding diuretics.   -BNP was 43.7 but is not -Continue with Metoprolol tartrate 25 mg p.o. twice daily -Resumed IV fluids again today as above -Patient is +1.284 L since admission -Continue to monitor for signs and symptoms of volume overload  Obstructive sleep apnea       -Continue nightly CPAP  Gastroesophageal Reflux Disease (GERD) -Continue Pantoprazole 40 mg po BID and she was negative for C. difficile  Hypomagnesemia -Mag Level is now 1.9 -Continue to Monitor and Replete as Necessary -Repeat Mag Level in the AM   Morbid Obesity   -Estimated body mass index is 41.95 kg/m as calculated from the following:   Height as of 02/24/20: 5\' 7"  (1.702 m).   Weight as of this encounter: 121.5 kg.  -Continued Weight Loss and Dietary Counseling as Morbid Obesity complicates all hospital medical care  DVT prophylaxis: Anticoagulated with Pradaxa Code Status: FULL CODE  Family Communication: No family present at bedside  Disposition Plan: Pending tolerance of p.o.  diet and improvement in her electrolytes and resolution of her diarrhea will continue; will continue on the full code diet for now  Status is: Inpatient  Remains  inpatient appropriate because:Persistent severe electrolyte disturbances, Ongoing active pain requiring inpatient pain management, IV treatments appropriate due to intensity of illness or inability to take PO and Inpatient level of care appropriate due to severity of illness   Dispo: The patient is from: Home              Anticipated d/c is to: TBD              Anticipated d/c date is: 2 days              Patient currently is not medically stable to d/c.  Consultants:   None   Procedures: None   Antimicrobials: Anti-infectives (From admission, onward)   None     Subjective: Seen and examined at bedside and states that she feels bad still and continues to have some bowel movement and has had to overnight.  Continues to have some nausea and vomited earlier this morning.  Does not feel very well.  No other concerns or complaints at this time.  Objective: Vitals:   03/04/20 1451 03/04/20 2058 03/05/20 0500 03/05/20 0526  BP: 135/84 118/90  116/85  Pulse: 72 97  77  Resp: 18 19  16   Temp: (!) 97.5 F (36.4 C) 98.2 F (36.8 C)  (!) 97.4 F (36.3 C)  TempSrc: Oral Oral  Oral  SpO2: 100% 100%  100%  Weight:   121.5 kg     Intake/Output Summary (Last 24 hours) at 03/05/2020 1344 Last data filed at 03/05/2020 0534 Gross per 24 hour  Intake 1203.1 ml  Output 1025 ml  Net 178.1 ml   Filed Weights   03/04/20 0500 03/05/20 0500  Weight: 121.1 kg 121.5 kg   Examination: Physical Exam:  Constitutional: WN/WD morbidly obese African-American female in mild distress appears uncomfortable again complaining of not feeling well  Eyes: PERRL, lids and conjunctivae normal, sclerae anicteric  ENMT: External Ears, Nose appear normal. Grossly normal hearing. Mucous membranes are moist. Posterior pharynx clear of any exudate or lesions. Normal dentition.  Neck: Appears normal, supple, no cervical masses, normal ROM, no appreciable thyromegaly; no JVD Respiratory: Diminished to auscultation  bilaterally, no wheezing, rales, rhonchi or crackles. Normal respiratory effort and patient is not tachypenic. No accessory muscle use.  Cardiovascular: RRR, no murmurs / rubs / gallops. S1 and S2 auscultated. Trace edema Abdomen: Soft, tender to palpate, distended secondary body habitus.  Bowel sounds positive.  GU: Deferred. Musculoskeletal: No clubbing / cyanosis of digits/nails. No joint deformity upper and lower extremities.  Skin: No rashes, lesions, ulcers on limited skin evaluation. No induration; Warm and dry.  Neurologic: CN 2-12 grossly intact with no focal deficits. Romberg sign and cerebellar reflexes not assessed.  Psychiatric: Normal judgment and insight. Alert and oriented x 3. Normal mood and appropriate affect.   Data Reviewed: I have personally reviewed following labs and imaging studies  CBC: Recent Labs  Lab 03/02/20 1527 03/03/20 1117 03/04/20 0416 03/05/20 0243  WBC 10.3 12.0* 10.7* 10.8*  NEUTROABS 7.3 8.4* 7.0 7.0  HGB 14.3 13.2 11.9* 11.6*  HCT 41.6 39.7 35.8* 34.7*  MCV 87.2 88.8 87.3 88.3  PLT 259 215 214 470   Basic Metabolic Panel: Recent Labs  Lab 03/02/20 1527 03/03/20 1117 03/04/20 0416 03/05/20 0243  NA 136 136 136 135  K 2.8* 3.2*  3.4* 3.5  CL 90* 94* 97* 98  CO2 31 29 29 28   GLUCOSE 259* 210* 142* 129*  BUN 15 15 11 8   CREATININE 1.74* 1.71* 1.53* 1.46*  CALCIUM 9.1 8.8* 8.7* 8.5*  MG  --  1.2* 2.2 1.9  PHOS  --  2.9 3.1 3.1   GFR: Estimated Creatinine Clearance: 53.3 mL/min (A) (by C-G formula based on SCr of 1.46 mg/dL (H)). Liver Function Tests: Recent Labs  Lab 03/02/20 1527 03/03/20 1117 03/04/20 0416 03/05/20 0243  AST 20 24 19 16   ALT 15 16 13 12   ALKPHOS 81 78 69 66  BILITOT 0.8 0.8 0.5 0.8  PROT 7.7 7.3 6.8 6.6  ALBUMIN 3.2* 3.2* 2.9* 2.7*   Recent Labs  Lab 03/02/20 1527  LIPASE 15   No results for input(s): AMMONIA in the last 168 hours. Coagulation Profile: Recent Labs  Lab 03/02/20 1527  INR 1.5*    Cardiac Enzymes: No results for input(s): CKTOTAL, CKMB, CKMBINDEX, TROPONINI in the last 168 hours. BNP (last 3 results) No results for input(s): PROBNP in the last 8760 hours. HbA1C: Recent Labs    03/02/20 1527  HGBA1C 10.0*   CBG: Recent Labs  Lab 03/04/20 1205 03/04/20 1626 03/04/20 2055 03/05/20 0800 03/05/20 1102  GLUCAP 151* 134* 147* 115* 222*   Lipid Profile: No results for input(s): CHOL, HDL, LDLCALC, TRIG, CHOLHDL, LDLDIRECT in the last 72 hours. Thyroid Function Tests: No results for input(s): TSH, T4TOTAL, FREET4, T3FREE, THYROIDAB in the last 72 hours. Anemia Panel: No results for input(s): VITAMINB12, FOLATE, FERRITIN, TIBC, IRON, RETICCTPCT in the last 72 hours. Sepsis Labs: No results for input(s): PROCALCITON, LATICACIDVEN in the last 168 hours.  Recent Results (from the past 240 hour(s))  SARS Coronavirus 2 by RT PCR (hospital order, performed in [facility] hospital lab) Nasopharyngeal Nasopharyngeal Swab     Status: None   Collection Time: 03/02/20  7:51 PM   Specimen: Nasopharyngeal Swab  Result Value Ref Range Status   SARS Coronavirus 2 NEGATIVE NEGATIVE Final    Comment: (NOTE) SARS-CoV-2 target nucleic acids are NOT DETECTED.  The SARS-CoV-2 RNA is generally detectable in upper and lower respiratory specimens during the acute phase of infection. The lowest concentration of SARS-CoV-2 viral copies this assay can detect is 250 copies / mL. A negative result does not preclude SARS-CoV-2 infection and should not be used as the sole basis for treatment or other patient management decisions.  A negative result may occur with improper specimen collection / handling, submission of specimen other than nasopharyngeal swab, presence of viral mutation(s) within the areas targeted by this assay, and inadequate number of viral copies (<250 copies / mL). A negative result must be combined with clinical observations, patient history, and epidemiological  information.  Fact Sheet for Patients:   StrictlyIdeas.no  Fact Sheet for Healthcare Providers: BankingDealers.co.za  This test is not yet approved or  cleared by the [place] FDA and has been authorized for detection and/or diagnosis of SARS-CoV-2 by FDA under an Emergency Use Authorization (EUA).  This EUA will remain in effect (meaning this test can be used) for the duration of the COVID-19 declaration under Section 564(b)(1) of the Act, 21 U.S.C. section 360bbb-3(b)(1), unless the authorization is terminated or revoked sooner.  Performed at [facility], [place] [address], [place], [name] [address]   Gastrointestinal Panel by PCR , Stool     Status: None   Collection Time: 03/04/20 10:04 AM   Specimen:  STOOL  Result Value Ref Range Status   Campylobacter species NOT DETECTED NOT DETECTED Final   Plesimonas shigelloides NOT DETECTED NOT DETECTED Final   Salmonella species NOT DETECTED NOT DETECTED Final   Yersinia enterocolitica NOT DETECTED NOT DETECTED Final   Vibrio species NOT DETECTED NOT DETECTED Final   Vibrio cholerae NOT DETECTED NOT DETECTED Final   Enteroaggregative E coli (EAEC) NOT DETECTED NOT DETECTED Final   Enteropathogenic E coli (EPEC) NOT DETECTED NOT DETECTED Final   Enterotoxigenic E coli (ETEC) NOT DETECTED NOT DETECTED Final   Shiga like toxin producing E coli (STEC) NOT DETECTED NOT DETECTED Final   Shigella/Enteroinvasive E coli (EIEC) NOT DETECTED NOT DETECTED Final   Cryptosporidium NOT DETECTED NOT DETECTED Final   Cyclospora cayetanensis NOT DETECTED NOT DETECTED Final   Entamoeba histolytica NOT DETECTED NOT DETECTED Final   Giardia lamblia NOT DETECTED NOT DETECTED Final   Adenovirus F40/41 NOT DETECTED NOT DETECTED Final   Astrovirus NOT DETECTED NOT DETECTED Final   Norovirus GI/GII NOT DETECTED NOT DETECTED Final   Rotavirus A NOT DETECTED NOT DETECTED Final   Sapovirus (I, II, IV,  and V) NOT DETECTED NOT DETECTED Final    Comment: Performed at [facility], [address] [place], [place] [address]  C Difficile Quick Screen w PCR reflex     Status: None   Collection Time: 03/04/20 10:04 AM   Specimen: STOOL  Result Value Ref Range Status   C Diff antigen NEGATIVE NEGATIVE Final   C Diff toxin NEGATIVE NEGATIVE Final   C Diff interpretation No C. difficile detected.  Final    Comment: Performed at [facility], [place] [address] [place], [place] [address]    RN Pressure Injury Documentation:     Estimated body mass index is 41.95 kg/m as calculated from the following:   Height as of 02/24/20: 5\' 7"  (1.702 m).   Weight as of this encounter: 121.5 kg.  Malnutrition Type:      Malnutrition Characteristics:      Nutrition Interventions:    Radiology Studies: CT ABDOMEN PELVIS WO CONTRAST  Result Date: 03/04/2020 CLINICAL DATA:  Acute gastroenteritis EXAM: CT ABDOMEN AND PELVIS WITHOUT CONTRAST TECHNIQUE: Multidetector CT imaging of the abdomen and pelvis was performed following the standard protocol without IV contrast. COMPARISON:  03/02/2020, 01/30/2020 FINDINGS: Lower chest: No acute pleural or parenchymal lung disease. The heart is enlarged but stable. Hepatobiliary: Choose 2 Pancreas: Unremarkable. No pancreatic ductal dilatation or surrounding inflammatory changes. Spleen: Normal in size without focal abnormality. Adrenals/Urinary Tract: Adrenal glands are unremarkable. Kidneys are normal, without renal calculi, focal lesion, or hydronephrosis. Bladder is unremarkable. Stomach/Bowel: No bowel obstruction or ileus. Normal appendix right lower quadrant. No bowel wall thickening or inflammatory change. Vascular/Lymphatic: Aortic atherosclerosis. Stable left renal artery aneurysms. No enlarged abdominal or pelvic lymph nodes. Reproductive: Status post hysterectomy. No adnexal masses. Other: No abdominal wall hernia or abnormality. No abdominopelvic  ascites. Musculoskeletal: No acute or destructive bony lesions. Reconstructed images demonstrate no additional findings. IMPRESSION: No acute intra-abdominal or intrapelvic process. Electronically Signed   By: Randa Ngo M.D.   On: 03/04/2020 16:27   Scheduled Meds: . dabigatran  150 mg Oral BID  . digoxin  125 mcg Oral Daily  . diltiazem  60 mg Oral Q8H  . DULoxetine  60 mg Oral Daily  . insulin aspart  0-15 Units Subcutaneous TID WC  . insulin glargine  40 Units Subcutaneous BID  . metoCLOPramide (REGLAN) injection  10 mg Intravenous Q8H  . metoprolol tartrate  25 mg Oral BID  . pantoprazole  40 mg Oral BID  . potassium chloride  20 mEq Oral BID  . rosuvastatin  10 mg Oral Daily   Continuous Infusions:   LOS: 1 day   Kerney Elbe, DO [facility] PAGER is on [place]  If 7PM-7AM, please contact night-coverage [email or web address]

## 2020-03-05 NOTE — Progress Notes (Signed)
Patient refused CPAP again tonight.  Patient stated she was still feeling sick.

## 2020-03-05 NOTE — Consult Note (Signed)
   [facility] Inpatient Consult   03/05/2020  [name] Sep 16, 1955 [id number]   [facility] [ACO] Patient:  [name]  Patient was assessed for [facility] for community services. Patient was previously active with [facility].   Chart briefly reviewed and patient recently admitted for Acute gastroenteritis noted per MD notes today.  Plan:  Will follow progress and refer back to [facility] team as appropriate.  Of note, [facility] Care Management services does not replace or interfere with any services that are arranged by inpatient [facility] care management team.   For additional questions or referrals please contact:  Natividad Brood, RN BSN [facility] Liaison  [phone] business mobile phone Toll free office [phone]  Fax number: [phone] [email or web address]

## 2020-03-06 DIAGNOSIS — R112 Nausea with vomiting, unspecified: Secondary | ICD-10-CM

## 2020-03-06 DIAGNOSIS — R197 Diarrhea, unspecified: Secondary | ICD-10-CM

## 2020-03-06 LAB — CBC WITH DIFFERENTIAL/PLATELET
Abs Immature Granulocytes: 0.04 10*3/uL (ref 0.00–0.07)
Basophils Absolute: 0 10*3/uL (ref 0.0–0.1)
Basophils Relative: 0 %
Eosinophils Absolute: 0.1 10*3/uL (ref 0.0–0.5)
Eosinophils Relative: 1 %
HCT: 35.4 % — ABNORMAL LOW (ref 36.0–46.0)
Hemoglobin: 11.5 g/dL — ABNORMAL LOW (ref 12.0–15.0)
Immature Granulocytes: 1 %
Lymphocytes Relative: 18 %
Lymphs Abs: 1.6 10*3/uL (ref 0.7–4.0)
MCH: 29 pg (ref 26.0–34.0)
MCHC: 32.5 g/dL (ref 30.0–36.0)
MCV: 89.4 fL (ref 80.0–100.0)
Monocytes Absolute: 0.8 10*3/uL (ref 0.1–1.0)
Monocytes Relative: 9 %
Neutro Abs: 6.3 10*3/uL (ref 1.7–7.7)
Neutrophils Relative %: 71 %
Platelets: 201 10*3/uL (ref 150–400)
RBC: 3.96 MIL/uL (ref 3.87–5.11)
RDW: 13.8 % (ref 11.5–15.5)
WBC: 8.8 10*3/uL (ref 4.0–10.5)
nRBC: 0 % (ref 0.0–0.2)

## 2020-03-06 LAB — URINALYSIS, ROUTINE W REFLEX MICROSCOPIC
Bacteria, UA: NONE SEEN
Bilirubin Urine: NEGATIVE
Glucose, UA: NEGATIVE mg/dL
Ketones, ur: NEGATIVE mg/dL
Leukocytes,Ua: NEGATIVE
Nitrite: NEGATIVE
Protein, ur: NEGATIVE mg/dL
Specific Gravity, Urine: 1.005 (ref 1.005–1.030)
pH: 5 (ref 5.0–8.0)

## 2020-03-06 LAB — COMPREHENSIVE METABOLIC PANEL
ALT: 14 U/L (ref 0–44)
AST: 18 U/L (ref 15–41)
Albumin: 2.7 g/dL — ABNORMAL LOW (ref 3.5–5.0)
Alkaline Phosphatase: 75 U/L (ref 38–126)
Anion gap: 10 (ref 5–15)
BUN: 5 mg/dL — ABNORMAL LOW (ref 8–23)
CO2: 24 mmol/L (ref 22–32)
Calcium: 8.7 mg/dL — ABNORMAL LOW (ref 8.9–10.3)
Chloride: 103 mmol/L (ref 98–111)
Creatinine, Ser: 1.37 mg/dL — ABNORMAL HIGH (ref 0.44–1.00)
GFR calc Af Amer: 47 mL/min — ABNORMAL LOW (ref >60)
GFR calc non Af Amer: 41 mL/min — ABNORMAL LOW (ref >60)
Glucose, Bld: 139 mg/dL — ABNORMAL HIGH (ref 70–99)
Potassium: 3.6 mmol/L (ref 3.5–5.1)
Sodium: 137 mmol/L (ref 135–145)
Total Bilirubin: 0.5 mg/dL (ref 0.3–1.2)
Total Protein: 6.7 g/dL (ref 6.5–8.1)

## 2020-03-06 LAB — GLUCOSE, CAPILLARY
Glucose-Capillary: 189 mg/dL — ABNORMAL HIGH (ref 70–99)
Glucose-Capillary: 108 mg/dL — ABNORMAL HIGH (ref 70–99)
Glucose-Capillary: 137 mg/dL — ABNORMAL HIGH (ref 70–99)
Glucose-Capillary: 126 mg/dL — ABNORMAL HIGH (ref 70–99)

## 2020-03-06 LAB — DIGOXIN LEVEL: Digoxin Level: 0.8 ng/mL (ref 0.8–2.0)

## 2020-03-06 LAB — MAGNESIUM: Magnesium: 1.7 mg/dL (ref 1.7–2.4)

## 2020-03-06 LAB — PHOSPHORUS: Phosphorus: 3 mg/dL (ref 2.5–4.6)

## 2020-03-06 MED ORDER — POTASSIUM CHLORIDE CRYS ER 20 MEQ PO TBCR
40.0000 meq | EXTENDED_RELEASE_TABLET | Freq: Two times a day (BID) | ORAL | Status: AC
  Administered 2020-03-06 (×2): 40 meq via ORAL
  Filled 2020-03-06 (×2): qty 2

## 2020-03-06 MED ORDER — POTASSIUM CHLORIDE CRYS ER 20 MEQ PO TBCR
20.0000 meq | EXTENDED_RELEASE_TABLET | Freq: Two times a day (BID) | ORAL | Status: DC
  Administered 2020-03-07 (×2): 20 meq via ORAL
  Filled 2020-03-06 (×2): qty 1

## 2020-03-06 MED ORDER — MAGNESIUM SULFATE 2 GM/50ML IV SOLN
2.0000 g | Freq: Once | INTRAVENOUS | Status: AC
  Administered 2020-03-06: 2 g via INTRAVENOUS
  Filled 2020-03-06: qty 50

## 2020-03-06 NOTE — Evaluation (Signed)
Physical Therapy Evaluation Patient Details Name: [name] MRN: [id number] DOB: Feb 01, 1956 Today's Date: 03/06/2020   History of Present Illness  64 y.o. female with medical history significant of hypertension, diabetes mellitus type 2, insulin-dependent, hyperlipidemia, paroxysmal atrial fibrillation, chronic congestive heart failure with reduced ejection fraction (45-50% 06/2018), chronic obstructive pulmonary disease, obstructive sleep apnea on nightly CPAP, morbid obesity, chronic kidney disease stage III, with baseline creatinine 1.6, gastroesophageal reflux disease who came to hospital for evaluation of persistent nausea vomiting with diarrhea. Pt admitted for gastroparesis.  Clinical Impression  Pt presents to PT with deficits in functional mobility, gait, balance, and endurance. Pt has a history of falls and ambulates with reduced gait speed and a widened BOS to compensate for balance deviations. While pt does not demonstrate any significant balance deviations this session RN reports a loss of balance in bathroom earlier in the day. Pt will benefit from continued acute PT services to improve gait and balance while reducing falls risk. PT recommends HHPT at the time of discharge.    Follow Up Recommendations Home health PT;Supervision - Intermittent    Equipment Recommendations  None recommended by PT    Recommendations for Other Services       Precautions / Restrictions Precautions Precautions: Fall Restrictions Weight Bearing Restrictions: No      Mobility  Bed Mobility Overal bed mobility: Needs Assistance Bed Mobility: Supine to Sit;Sit to Supine     Supine to sit: Supervision Sit to supine: Supervision      Transfers Overall transfer level: Needs assistance Equipment used: Straight cane Transfers: Sit to/from Stand Sit to Stand: Supervision            Ambulation/Gait Ambulation/Gait assistance: Supervision Gait Distance (Feet): 150  Feet Assistive device: Straight cane Gait Pattern/deviations: Step-to pattern;Wide base of support Gait velocity: reduced Gait velocity interpretation: <1.8 ft/sec, indicate of risk for recurrent falls General Gait Details: pt with shortened stride length, increased lateral sway but no LOB noted  Stairs            Wheelchair Mobility    Modified Rankin (Stroke Patients Only)       Balance Overall balance assessment: Needs assistance Sitting-balance support: No upper extremity supported;Feet supported Sitting balance-Leahy Scale: Good Sitting balance - Comments: supervision   Standing balance support: Single extremity supported Standing balance-Leahy Scale: Poor Standing balance comment: reliant on UE support of cane                             Pertinent Vitals/Pain Pain Assessment: Faces Faces Pain Scale: Hurts a little bit Pain Location: generalized Pain Descriptors / Indicators: Grimacing Pain Intervention(s): Monitored during session    Home Living Family/patient expects to be discharged to:: Private residence Living Arrangements: Children;Other relatives (daughter and 3 grandchildren) Available Help at Discharge: Family;Available 24 hours/day Type of Home: Apartment Home Access: Stairs to enter Entrance Stairs-Rails: Left Entrance Stairs-Number of Steps: 1 + 1+ down then a landing then 2 steps up Home Layout: One level Home Equipment: Cane - single point;Walker - 4 wheels;Shower seat;Wheelchair - manual      Prior Function Level of Independence: Independent with assistive device(s)         Comments: pt ambulates independently with cane but has history of falls, none recently     Hand Dominance   Dominant Hand: Right    Extremity/Trunk Assessment   Upper Extremity Assessment Upper Extremity Assessment: Overall WFL for tasks assessed  Lower Extremity Assessment Lower Extremity Assessment: Overall WFL for tasks assessed     Cervical / Trunk Assessment Cervical / Trunk Assessment: Normal  Communication   Communication: No difficulties  Cognition Arousal/Alertness: Awake/alert Behavior During Therapy: WFL for tasks assessed/performed Overall Cognitive Status: Within Functional Limits for tasks assessed                                        General Comments General comments (skin integrity, edema, etc.): VSS on RA    Exercises     Assessment/Plan    PT Assessment Patient needs continued PT services  PT Problem List Decreased activity tolerance;Decreased mobility;Decreased balance       PT Treatment Interventions DME instruction;Gait training;Stair training;Functional mobility training;Therapeutic activities;Therapeutic exercise;Balance training;Neuromuscular re-education;Patient/family education    PT Goals (Current goals can be found in the Care Plan section)  Acute Rehab PT Goals Patient Stated Goal: To improve balance PT Goal Formulation: With patient Time For Goal Achievement: 03/20/20 Potential to Achieve Goals: Good    Frequency Min 3X/week   Barriers to discharge        Co-evaluation               AM-PAC PT "6 Clicks" Mobility  Outcome Measure Help needed turning from your back to your side while in a flat bed without using bedrails?: None Help needed moving from lying on your back to sitting on the side of a flat bed without using bedrails?: None Help needed moving to and from a bed to a chair (including a wheelchair)?: None Help needed standing up from a chair using your arms (e.g., wheelchair or bedside chair)?: None Help needed to walk in hospital room?: None Help needed climbing 3-5 steps with a railing? : A Little 6 Click Score: 23    End of Session   Activity Tolerance: Patient tolerated treatment well Patient left: in bed;with call bell/phone within reach;with family/visitor present Nurse Communication: Mobility status PT Visit Diagnosis:  Other abnormalities of gait and mobility (R26.89);Unsteadiness on feet (R26.81);History of falling (Z91.81)    Time: 4431-5400 PT Time Calculation (min) (ACUTE ONLY): 18 min   Charges:   PT Evaluation $PT Eval Low Complexity: [name], PT, DPT Acute Rehabilitation Pager: [phone]   [name] 03/06/2020, 5:20 PM

## 2020-03-06 NOTE — Progress Notes (Signed)
PROGRESS NOTE    [name]  DXI:[id number] DOB: 11-24-1955 DOA: 03/02/2020 PCP: Leslie Perna, NP  Brief Narrative: HPI per Dr. Allie Gallagher on 03/02/20 [name] is a 64 y.o. female with medical history significant of hypertension, diabetes mellitus type 2, insulin-dependent, hyperlipidemia, paroxysmal atrial fibrillation, chronic congestive heart failure with reduced ejection fraction (45-50% 06/2018), chronic obstructive pulmonary disease, obstructive sleep apnea on nightly CPAP, morbid obesity, chronic kidney disease stage III, with baseline creatinine 1.6, gastroesophageal reflux disease who came to hospital for evaluation of persistent nausea vomiting with diarrhea.  Patient stated that her symptoms started on Friday, after she had a roast beef sandwich.  Her symptoms include multiple episode of nausea with vomiting as well as diarrhea per day.  She described at times dark vomiting mixed with greenish vomiting, and similarly dark-colored diarrhea.  She denied having any bright red blood per rectum.  She reported diffuse abdominal pain, about 8/10, cramping of character, intermittent.  She had similar symptoms about a month ago, which resolved spontaneously, although there were not as prominent as today.  Denies any recent travel, and similar symptoms in any family members.  Due to symptoms she was unable to take all her medications, including medication for control of heart rate.  ED Course:  In the ED patient was found to be afebrile temperature 98.4 F.  Blood pressure was stable 117/94, however her heart rate was elevated to 113/min, with rhythm consistent with atrial fibrillation.  Patient was not hypoxic. CBC shows no leukocytosis or anemia, Hemoccult 14.3. Chemistry was positive for elevated creatinine 1.7, as well as mild hypokalemia 2.8 with hyponatremia 136.  Glucose 259, without evidence of DKA.  Noted negative troponin x2 and proBNP of 43.  Lipase and  LFTs within normal limits.  Chest x-ray showed no acute cardiopulmonary abnormalities.  Abdominal x-ray negative for acute finding.  Patient had previous CT abdomen pelvis on 6/11, with no acute findings. Patient was treated with oral Cardizem, metoprolol, given pantoprazole, IV potassium replacement and IV fluids, as well as pain control with IV morphine. Patient will be admitted to medical telemetry for close monitoring and further treatment  **Interim History  03/04/20 She states that her nausea is improving somewhat but continues have some diarrhea now is having some crampy abdominal pain.  Continues to have some abdominal pain.  States that she ate [employer] last week.  Continues to get IV fluid resuscitation because she is not improving will obtain a CT of the abdomen pelvis without contrast given her renal function.  We will discussed the case with gastroenterology Dr. Benson Gallagher if she fails to improve we will get his consultation formally.  03/05/20  Continues to feel generally unwell and vomited this morning.  Continues to have diarrheal episodes and complains of abdominal pain.  CT scan of the abdomen pelvis and showed no acute intra-abdominal pathology.  GI feels that this may be prolonged food poisoning inducing a cascade of affect with potential exacerbation of her gastroparesis.  We will continue supportive care with IV hydration and continue with antiemetics.  Of note we started metoclopramide this morning scheduled however Dr. Benson Gallagher  feels that it may be contributing to her diarrhea but it was just started this morning  03/06/2020 She continues to have some diarrhea and some nausea but did not actually vomit.  GI has advanced her diet to a soft diabetic diet and will continue metoclopramide until midnight tonight.  They recommend continuing current care and cut back on the  diet if she continues have more problems.  [name] is also added a digoxin level   Assessment & Plan:   Principal  Problem:   Acute gastroenteritis Active Problems:   Morbid obesity (HCC)   OSA (obstructive sleep apnea)   Nausea vomiting and diarrhea   Diabetes mellitus type 2 in obese (HCC)   Atrial fibrillation with RVR (HCC)   Congestive heart failure with left ventricular diastolic dysfunction, acute (HCC)  Nausea vomiting and diarrhea Acute gastroenteritis versus diabetic gastroparesis or a combination of both, -Patient has ongoing symptoms concerning for either acute gastroenteritis or diabetic gastroparesis.  She started having symptoms last Friday after she ate at [employer] and this is persistent -Patient had vomiting this morning and continues to have some diarrhea -Obtain stool studies including C. difficile toxin as well as GI panel.  C. difficile was negative and the GI pathogen panel is also negative -IV fluid hydration has now stopped -Continue with antiemetics and analgesics.  Continue pantoprazole. -WBC went from 10.3 -> 12.0 -> 10.7 -> 10.8 -> 8.8 -Continue supportive care and will obtain a CT of the abdomen and pelvis without contrast -CT Scan of the abdomen pelvis showed "No acute intra-abdominal or intrapelvic process." -Continue to monitor closely -Started the patient on IV metoclopramide scheduled 10 mg every 8 hours and GI will stop this at midnight -Ensure blood sugars remain under 200 -GI recommends advancing to a soft diabetic diet and cutting back if necessary  Atrial fibrillation with rapid ventricular response, improved and not tachycardic -Heart rate was poorly controlled, as patient missed her medications. -Resumed diltiazem, metoprolol and digoxin.  Resume anticoagulation with Pradaxa. -Currently patient does not require any IV drip for control of her abnormal heart rate. -Continue to monitor on telemetry carefully  -Just checking a digoxin level given the patient takes digoxin 125 mcg p.o. daily  Chronic kidney disease stage IIIb, improving -Renal function seems to  be close to baseline, creatinine 1.6. -Patient has no signs of volume overload hence continue with IV fluids, with potassium content. -Holding diuretics for now and may resume in the a.m. possibly but will hold for now given that she continues to have some diarrhea -We will resume IV fluids given her persistent diarrhea -Patient's BUNs/creatinine on from 15/1.74 on admission and is now 5/1.37 -Avoid nephrotoxic medications, contrast dyes, hypotension and renally adjust medications  -Repeat CMP in a.m.  Diabetes mellitus type 2, poorly controlled -Patient currently has no evidence of DKA or hyperosmolar state. -Resume insulin regimen, with reduced dose, Lantus 40 units twice daily, with sliding scale coverage. -Resume carbohydrate consistent diet. -CBGs ranging from 108-189 -HbA1c is 10.0 -She will ll need diabetes education coordinator evaluation for further recommendations  Hypokalemia -Patient's potassium this morning was 3.6 -In the setting of diarrhea -Replete with p.o. potassium chloride -Continue to monitor and replete as necessary -Repeat CMP in a.m.  Chronic congestive heart failure with reduced ejection fraction 45 to 50% -Patient currently without signs of volume overload, no edema, clear chest x-ray. -Given signs of dehydration, holding diuretics.   -BNP was 43.7 but is not -Continue with Metoprolol tartrate 25 mg p.o. twice daily -Resumed IV fluids again today as above -Patient is +1.846 L since admission -Continue to monitor for signs and symptoms of volume overload  Obstructive sleep apnea       -Continue nightly CPAP  Gastroesophageal Reflux Disease (GERD) -Continue Pantoprazole 40 mg po BID and she was negative for C. difficile  Hypomagnesemia -Mag Level is  no w 1.7 and will replete with IV mag sulfate 2 g -Continue to Monitor and Replete as Necessary -Repeat Mag Level in the AM   Normocytic Anemia -Patient's hemoglobin/hematocrit is now  11.5/35.4 -Patient's hemoglobin/hematocrit dropped from 14.3/41.6 and is likely a dilutional drop given her amount of IV fluids that she got  -Check Anemia Panel -Continue to monitor for signs and symptoms of bleeding; currently no overt bleeding noted Repeat CBC in a.m.  Morbid Obesity   -Estimated body mass index is 41.43 kg/m as calculated from the following:   Height as of 02/24/20: 5\' 7"  (1.702 m).   Weight as of this encounter: 120 kg.  -Continued Weight Loss and Dietary Counseling as Morbid Obesity complicates all hospital medical care  DVT prophylaxis: Anticoagulated with Pradaxa Code Status: FULL CODE  Family Communication: No family present at bedside  Disposition Plan: Pending tolerance of p.o. diet and improvement in her electrolytes and resolution of her diarrhea will continue; will continue on the full code diet for now.  PT OT recommending home health  Status is: Inpatient  Remains inpatient appropriate because:Persistent severe electrolyte disturbances, Ongoing active pain requiring inpatient pain management, IV treatments appropriate due to intensity of illness or inability to take PO and Inpatient level of care appropriate due to severity of illness   Dispo: The patient is from: Home              Anticipated d/c is to: Home              Anticipated d/c date is: 2 days              Patient currently is not medically stable to d/c.  Consultants:   None   Procedures: None   Antimicrobials: Anti-infectives (From admission, onward)   None     Subjective: Seen and examined at bedside and states that she feels minimally better but still continues have some diarrhea and nausea.  Has not vomited but okay about trying to advance diet.  Still continues to have loose bowel movements.  No chest pain, lightheadedness or dizziness.  No other concerns or plans at this time.  Objective: Vitals:   03/05/20 1359 03/05/20 2139 03/06/20 0534 03/06/20 0537  BP: 117/70  130/66  133/72  Pulse: 77 66  60  Resp: 17 20  18   Temp: 97.7 F (36.5 C) 97.9 F (36.6 C)  98.8 F (37.1 C)  TempSrc:  Oral  Oral  SpO2: 100% 99%  97%  Weight:   120 kg     Intake/Output Summary (Last 24 hours) at 03/06/2020 1729 Last data filed at 03/05/2020 1850 Gross per 24 hour  Intake 561.59 ml  Output --  Net 561.59 ml   Filed Weights   03/04/20 0500 03/05/20 0500 03/06/20 0534  Weight: 121.1 kg 121.5 kg 120 kg   Examination: Physical Exam:  Constitutional: WN/WD morbidly obese African-American female currently in NAD and appears calm sitting in the chair bedside Eyes: Lids and conjunctivae normal, sclerae anicteric  ENMT: External Ears, Nose appear normal. Grossly normal hearing.  Neck: Appears normal, supple, no cervical masses, normal ROM, no appreciable thyromegaly; no JVD Respiratory: Diminished to auscultation bilaterally, no wheezing, rales, rhonchi or crackles. Normal respiratory effort and patient is not tachypenic. No accessory muscle use.  Unlabored breathing Cardiovascular: RRR, no murmurs / rubs / gallops. S1 and S2 auscultated. No carotid bruits.  Abdomen: Soft, a little tender to palpate, distended secondary body habitus. Bowel sounds positive.  GU:  Deferred. Musculoskeletal: No clubbing / cyanosis of digits/nails. No joint deformity upper and lower extremities.  Skin: No rashes, lesions, ulcers on limited skin evaluation. No induration; Warm and dry.  Neurologic: CN 2-12 grossly intact with no focal deficits. Romberg sign cerebellar reflexes not assessed.  Psychiatric: Normal judgment and insight. Alert and oriented x 3. Normal mood and appropriate affect.   Data Reviewed: I have personally reviewed following labs and imaging studies  CBC: Recent Labs  Lab 03/02/20 1527 03/03/20 1117 03/04/20 0416 03/05/20 0243 03/06/20 0716  WBC 10.3 12.0* 10.7* 10.8* 8.8  NEUTROABS 7.3 8.4* 7.0 7.0 6.3  HGB 14.3 13.2 11.9* 11.6* 11.5*  HCT 41.6 39.7 35.8*  34.7* 35.4*  MCV 87.2 88.8 87.3 88.3 89.4  PLT 259 215 214 217 962   Basic Metabolic Panel: Recent Labs  Lab 03/02/20 1527 03/03/20 1117 03/04/20 0416 03/05/20 0243 03/06/20 0716  NA 136 136 136 135 137  K 2.8* 3.2* 3.4* 3.5 3.6  CL 90* 94* 97* 98 103  CO2 31 29 29 28 24   GLUCOSE 259* 210* 142* 129* 139*  BUN 15 15 11 8  5*  CREATININE 1.74* 1.71* 1.53* 1.46* 1.37*  CALCIUM 9.1 8.8* 8.7* 8.5* 8.7*  MG  --  1.2* 2.2 1.9 1.7  PHOS  --  2.9 3.1 3.1 3.0   GFR: Estimated Creatinine Clearance: 56.4 mL/min (A) (by C-G formula based on SCr of 1.37 mg/dL (H)). Liver Function Tests: Recent Labs  Lab 03/02/20 1527 03/03/20 1117 03/04/20 0416 03/05/20 0243 03/06/20 0716  AST 20 24 19 16 18   ALT 15 16 13 12 14   ALKPHOS 81 78 69 66 75  BILITOT 0.8 0.8 0.5 0.8 0.5  PROT 7.7 7.3 6.8 6.6 6.7  ALBUMIN 3.2* 3.2* 2.9* 2.7* 2.7*   Recent Labs  Lab 03/02/20 1527  LIPASE 15   No results for input(s): AMMONIA in the last 168 hours. Coagulation Profile: Recent Labs  Lab 03/02/20 1527  INR 1.5*   Cardiac Enzymes: No results for input(s): CKTOTAL, CKMB, CKMBINDEX, TROPONINI in the last 168 hours. BNP (last 3 results) No results for input(s): PROBNP in the last 8760 hours. HbA1C: No results for input(s): HGBA1C in the last 72 hours. CBG: Recent Labs  Lab 03/05/20 1659 03/05/20 2212 03/06/20 0843 03/06/20 1115 03/06/20 1707  GLUCAP 191* 180* 189* 108* 137*   Lipid Profile: No results for input(s): CHOL, HDL, LDLCALC, TRIG, CHOLHDL, LDLDIRECT in the last 72 hours. Thyroid Function Tests: No results for input(s): TSH, T4TOTAL, FREET4, T3FREE, THYROIDAB in the last 72 hours. Anemia Panel: No results for input(s): VITAMINB12, FOLATE, FERRITIN, TIBC, IRON, RETICCTPCT in the last 72 hours. Sepsis Labs: No results for input(s): PROCALCITON, LATICACIDVEN in the last 168 hours.  Recent Results (from the past 240 hour(s))  SARS Coronavirus 2 by RT PCR (hospital order, performed  in [facility] hospital lab) Nasopharyngeal Nasopharyngeal Swab     Status: None   Collection Time: 03/02/20  7:51 PM   Specimen: Nasopharyngeal Swab  Result Value Ref Range Status   SARS Coronavirus 2 NEGATIVE NEGATIVE Final    Comment: (NOTE) SARS-CoV-2 target nucleic acids are NOT DETECTED.  The SARS-CoV-2 RNA is generally detectable in upper and lower respiratory specimens during the acute phase of infection. The lowest concentration of SARS-CoV-2 viral copies this assay can detect is 250 copies / mL. A negative result does not preclude SARS-CoV-2 infection and should not be used as the sole basis for treatment or other patient management decisions.  A negative result may occur with improper specimen collection / handling, submission of specimen other than nasopharyngeal swab, presence of viral mutation(s) within the areas targeted by this assay, and inadequate number of viral copies (<250 copies / mL). A negative result must be combined with clinical observations, patient history, and epidemiological information.  Fact Sheet for Patients:   StrictlyIdeas.no  Fact Sheet for Healthcare Providers: BankingDealers.co.za  This test is not yet approved or  cleared by the [place] FDA and has been authorized for detection and/or diagnosis of SARS-CoV-2 by FDA under an Emergency Use Authorization (EUA).  This EUA will remain in effect (meaning this test can be used) for the duration of the COVID-19 declaration under Section 564(b)(1) of the Act, 21 U.S.C. section 360bbb-3(b)(1), unless the authorization is terminated or revoked sooner.  Performed at [facility], [place] [address], [place], [place] [address]   Gastrointestinal Panel by PCR , Stool     Status: None   Collection Time: 03/04/20 10:04 AM   Specimen: STOOL  Result Value Ref Range Status   Campylobacter species NOT DETECTED NOT DETECTED Final   Plesimonas  shigelloides NOT DETECTED NOT DETECTED Final   Salmonella species NOT DETECTED NOT DETECTED Final   Yersinia enterocolitica NOT DETECTED NOT DETECTED Final   Vibrio species NOT DETECTED NOT DETECTED Final   Vibrio cholerae NOT DETECTED NOT DETECTED Final   Enteroaggregative E coli (EAEC) NOT DETECTED NOT DETECTED Final   Enteropathogenic E coli (EPEC) NOT DETECTED NOT DETECTED Final   Enterotoxigenic E coli (ETEC) NOT DETECTED NOT DETECTED Final   Shiga like toxin producing E coli (STEC) NOT DETECTED NOT DETECTED Final   Shigella/Enteroinvasive E coli (EIEC) NOT DETECTED NOT DETECTED Final   Cryptosporidium NOT DETECTED NOT DETECTED Final   Cyclospora cayetanensis NOT DETECTED NOT DETECTED Final   Entamoeba histolytica NOT DETECTED NOT DETECTED Final   Giardia lamblia NOT DETECTED NOT DETECTED Final   Adenovirus F40/41 NOT DETECTED NOT DETECTED Final   Astrovirus NOT DETECTED NOT DETECTED Final   Norovirus GI/GII NOT DETECTED NOT DETECTED Final   Rotavirus A NOT DETECTED NOT DETECTED Final   Sapovirus (I, II, IV, and V) NOT DETECTED NOT DETECTED Final    Comment: Performed at [facility], [address] [place], [place] [address]  C Difficile Quick Screen w PCR reflex     Status: None   Collection Time: 03/04/20 10:04 AM   Specimen: STOOL  Result Value Ref Range Status   C Diff antigen NEGATIVE NEGATIVE Final   C Diff toxin NEGATIVE NEGATIVE Final   C Diff interpretation No C. difficile detected.  Final    Comment: Performed at [facility], [place] [address] [place], [place] [address]    RN Pressure Injury Documentation:     Estimated body mass index is 41.43 kg/m as calculated from the following:   Height as of 02/24/20: 5\' 7"  (1.702 m).   Weight as of this encounter: 120 kg.  Malnutrition Type:      Malnutrition Characteristics:      Nutrition Interventions:    Radiology Studies: No results found. Scheduled Meds: . dabigatran  150 mg Oral BID  .  digoxin  125 mcg Oral Daily  . diltiazem  60 mg Oral Q8H  . DULoxetine  60 mg Oral Daily  . insulin aspart  0-15 Units Subcutaneous TID WC  . insulin glargine  40 Units Subcutaneous BID  . metoCLOPramide (REGLAN) injection  10 mg Intravenous  Q8H  . metoprolol tartrate  25 mg Oral BID  . pantoprazole  40 mg Oral BID  . [START ON 03/07/2020] potassium chloride  20 mEq Oral BID  . potassium chloride  40 mEq Oral BID  . rosuvastatin  10 mg Oral Daily   Continuous Infusions:   LOS: 2 days   Kerney Elbe, DO [facility] PAGER is on AMION  If 7PM-7AM, please contact night-coverage [email or web address]

## 2020-03-06 NOTE — Progress Notes (Addendum)
Daily Rounding Note-coverage for Dr. Carol Ada  03/06/2020, 8:53 AM  LOS: 2 days   SUBJECTIVE:   Chief complaint:  N/V.  Gastroparesis.     Tolerating clear liquids and hungry.  No nausea for several hours.  Thinks she could eat solid food.  Loose stool this morning.  No abdominal pain.  OBJECTIVE:         Vital signs in last 24 hours:    Temp:  [97.7 F (36.5 C)-98.8 F (37.1 C)] 98.8 F (37.1 C) (07/17 0537) Pulse Rate:  [60-77] 60 (07/17 0537) Resp:  [17-20] 18 (07/17 0537) BP: (117-133)/(66-72) 133/72 (07/17 0537) SpO2:  [97 %-100 %] 97 % (07/17 0537) Weight:  [120 kg] 120 kg (07/17 0534) Last BM Date: 03/06/20 Filed Weights   03/04/20 0500 03/05/20 0500 03/06/20 0534  Weight: 121.1 kg 121.5 kg 120 kg   General: Obese.  Comfortable.  Does not look ill Heart: RRR Chest: Clear Abdomen: Soft.  Bowel sounds active.  Nontender.  Obese. Extremities: No CCE. Neuro/Psych: Alert.  Calm.  Pleasant.  Fluid speech.  Oriented x3.  No gross weakness.  No tremors  Intake/Output from previous day: 07/16 0701 - 07/17 0700 In: 561.6 [P.O.:240; I.V.:321.6] Out: -   Lab Results: Recent Labs    03/04/20 0416 03/05/20 0243 03/06/20 0716  WBC 10.7* 10.8* 8.8  HGB 11.9* 11.6* 11.5*  HCT 35.8* 34.7* 35.4*  PLT 214 217 201   BMET Recent Labs    03/04/20 0416 03/05/20 0243 03/06/20 0716  NA 136 135 137  K 3.4* 3.5 3.6  CL 97* 98 103  CO2 29 28 24   GLUCOSE 142* 129* 139*  BUN 11 8 5*  CREATININE 1.53* 1.46* 1.37*  CALCIUM 8.7* 8.5* 8.7*   LFT Recent Labs    03/04/20 0416 03/05/20 0243 03/06/20 0716  PROT 6.8 6.6 6.7  ALBUMIN 2.9* 2.7* 2.7*  AST 19 16 18   ALT 13 12 14   ALKPHOS 69 66 75  BILITOT 0.5 0.8 0.5    Studies/Results: CT ABDOMEN PELVIS WO CONTRAST  Result Date: 03/04/2020 CLINICAL DATA:  Acute gastroenteritis EXAM: CT ABDOMEN AND PELVIS WITHOUT CONTRAST TECHNIQUE: Multidetector CT imaging  of the abdomen and pelvis was performed following the standard protocol without IV contrast. COMPARISON:  03/02/2020, 01/30/2020 FINDINGS: Lower chest: No acute pleural or parenchymal lung disease. The heart is enlarged but stable. Hepatobiliary: Choose 2 Pancreas: Unremarkable. No pancreatic ductal dilatation or surrounding inflammatory changes. Spleen: Normal in size without focal abnormality. Adrenals/Urinary Tract: Adrenal glands are unremarkable. Kidneys are normal, without renal calculi, focal lesion, or hydronephrosis. Bladder is unremarkable. Stomach/Bowel: No bowel obstruction or ileus. Normal appendix right lower quadrant. No bowel wall thickening or inflammatory change. Vascular/Lymphatic: Aortic atherosclerosis. Stable left renal artery aneurysms. No enlarged abdominal or pelvic lymph nodes. Reproductive: Status post hysterectomy. No adnexal masses. Other: No abdominal wall hernia or abnormality. No abdominopelvic ascites. Musculoskeletal: No acute or destructive bony lesions. Reconstructed images demonstrate no additional findings. IMPRESSION: No acute intra-abdominal or intrapelvic process. Electronically Signed   By: Randa Ngo M.D.   On: 03/04/2020 16:27   Scheduled Meds: . dabigatran  150 mg Oral BID  . digoxin  125 mcg Oral Daily  . diltiazem  60 mg Oral Q8H  . DULoxetine  60 mg Oral Daily  . insulin aspart  0-15 Units Subcutaneous TID WC  . insulin glargine  40 Units Subcutaneous BID  . metoCLOPramide (REGLAN) injection  10  mg Intravenous Q8H  . metoprolol tartrate  25 mg Oral BID  . pantoprazole  40 mg Oral BID  . potassium chloride  20 mEq Oral BID  . potassium chloride  40 mEq Oral BID  . rosuvastatin  10 mg Oral Daily   Continuous Infusions: . 0.45 % NaCl with KCl 20 mEq / L 75 mL/hr at 03/06/20 0653  . magnesium sulfate bolus IVPB     PRN Meds:.acetaminophen **OR** acetaminophen, albuterol, ondansetron **OR** ondansetron (ZOFRAN) IV, prochlorperazine,  traMADol   ASSESMENT:   *   Gastroparesis.  ? Acute flare due to food poisoning?   Non con CTAP unremrkable.    *   IDDM  *   PAF, CHF, OSA.  No current respiratory distress  *    CKD.  AKI improved  *   Chronic Pradaxa.  Not on hold   PLAN   *    Advance diet to diabetic, soft.  Continue IV Reglan through midnight.    Azucena Freed  03/06/2020, 8:53 AM Phone [phone]    [facility] Attending   I have taken an interval history, reviewed the chart and examined the patient. I agree with the Advanced Practitioner's note, impression and recommendations.     Tried some solid food - did not vomit but unsettling  Continue current care - pull back diet if more problems  I ordered dig level - added to prior draw  Gatha Mayer, MD, [facility] 03/06/2020 3:44 PM

## 2020-03-06 NOTE — Progress Notes (Signed)
Pt refusing CPAP tonight.

## 2020-03-07 LAB — PHOSPHORUS: Phosphorus: 2.4 mg/dL — ABNORMAL LOW (ref 2.5–4.6)

## 2020-03-07 LAB — RETICULOCYTES
Immature Retic Fract: 13.9 % (ref 2.3–15.9)
RBC.: 3.67 MIL/uL — ABNORMAL LOW (ref 3.87–5.11)
Retic Count, Absolute: 122.2 10*3/uL (ref 19.0–186.0)
Retic Ct Pct: 3.3 % — ABNORMAL HIGH (ref 0.4–3.1)

## 2020-03-07 LAB — CBC WITH DIFFERENTIAL/PLATELET
Abs Immature Granulocytes: 0.02 10*3/uL (ref 0.00–0.07)
Basophils Absolute: 0 10*3/uL (ref 0.0–0.1)
Basophils Relative: 0 %
Eosinophils Absolute: 0.2 10*3/uL (ref 0.0–0.5)
Eosinophils Relative: 2 %
HCT: 32.7 % — ABNORMAL LOW (ref 36.0–46.0)
Hemoglobin: 10.7 g/dL — ABNORMAL LOW (ref 12.0–15.0)
Immature Granulocytes: 0 %
Lymphocytes Relative: 22 %
Lymphs Abs: 2.3 10*3/uL (ref 0.7–4.0)
MCH: 29.3 pg (ref 26.0–34.0)
MCHC: 32.7 g/dL (ref 30.0–36.0)
MCV: 89.6 fL (ref 80.0–100.0)
Monocytes Absolute: 0.9 10*3/uL (ref 0.1–1.0)
Monocytes Relative: 8 %
Neutro Abs: 6.9 10*3/uL (ref 1.7–7.7)
Neutrophils Relative %: 68 %
Platelets: 220 10*3/uL (ref 150–400)
RBC: 3.65 MIL/uL — ABNORMAL LOW (ref 3.87–5.11)
RDW: 13.7 % (ref 11.5–15.5)
WBC: 10.3 10*3/uL (ref 4.0–10.5)
nRBC: 0 % (ref 0.0–0.2)

## 2020-03-07 LAB — COMPREHENSIVE METABOLIC PANEL
ALT: 13 U/L (ref 0–44)
AST: 17 U/L (ref 15–41)
Albumin: 2.7 g/dL — ABNORMAL LOW (ref 3.5–5.0)
Alkaline Phosphatase: 78 U/L (ref 38–126)
Anion gap: 7 (ref 5–15)
BUN: 6 mg/dL — ABNORMAL LOW (ref 8–23)
CO2: 25 mmol/L (ref 22–32)
Calcium: 8.8 mg/dL — ABNORMAL LOW (ref 8.9–10.3)
Chloride: 106 mmol/L (ref 98–111)
Creatinine, Ser: 1.35 mg/dL — ABNORMAL HIGH (ref 0.44–1.00)
GFR calc Af Amer: 48 mL/min — ABNORMAL LOW (ref >60)
GFR calc non Af Amer: 42 mL/min — ABNORMAL LOW (ref >60)
Glucose, Bld: 144 mg/dL — ABNORMAL HIGH (ref 70–99)
Potassium: 4.4 mmol/L (ref 3.5–5.1)
Sodium: 138 mmol/L (ref 135–145)
Total Bilirubin: <0.1 mg/dL — ABNORMAL LOW (ref 0.3–1.2)
Total Protein: 6.7 g/dL (ref 6.5–8.1)

## 2020-03-07 LAB — GLUCOSE, CAPILLARY
Glucose-Capillary: 79 mg/dL (ref 70–99)
Glucose-Capillary: 163 mg/dL — ABNORMAL HIGH (ref 70–99)
Glucose-Capillary: 155 mg/dL — ABNORMAL HIGH (ref 70–99)
Glucose-Capillary: 163 mg/dL — ABNORMAL HIGH (ref 70–99)

## 2020-03-07 LAB — FOLATE: Folate: 7.4 ng/mL (ref >5.9)

## 2020-03-07 LAB — IRON AND TIBC
Iron: 25 ug/dL — ABNORMAL LOW (ref 28–170)
Saturation Ratios: 13 % (ref 10.4–31.8)
TIBC: 199 ug/dL — ABNORMAL LOW (ref 250–450)
UIBC: 174 ug/dL

## 2020-03-07 LAB — VITAMIN B12: Vitamin B-12: 375 pg/mL (ref 180–914)

## 2020-03-07 LAB — FERRITIN: Ferritin: 222 ng/mL (ref 11–307)

## 2020-03-07 LAB — MAGNESIUM: Magnesium: 2 mg/dL (ref 1.7–2.4)

## 2020-03-07 MED ORDER — DILTIAZEM HCL 60 MG PO TABS
60.0000 mg | ORAL_TABLET | Freq: Three times a day (TID) | ORAL | Status: DC
  Administered 2020-03-07 – 2020-03-08 (×3): 60 mg via ORAL
  Filled 2020-03-07 (×3): qty 1

## 2020-03-07 MED ORDER — DILTIAZEM HCL 25 MG/5ML IV SOLN
10.0000 mg | Freq: Once | INTRAVENOUS | Status: DC
  Filled 2020-03-07: qty 5

## 2020-03-07 MED ORDER — DILTIAZEM LOAD VIA INFUSION
10.0000 mg | Freq: Once | INTRAVENOUS | Status: DC
  Filled 2020-03-07: qty 10

## 2020-03-07 MED ORDER — DILTIAZEM HCL-DEXTROSE 125-5 MG/125ML-% IV SOLN (PREMIX)
5.0000 mg/h | INTRAVENOUS | Status: DC
  Filled 2020-03-07: qty 125

## 2020-03-07 MED ORDER — K PHOS MONO-SOD PHOS DI & MONO 155-852-130 MG PO TABS
500.0000 mg | ORAL_TABLET | Freq: Once | ORAL | Status: AC
  Administered 2020-03-07: 500 mg via ORAL
  Filled 2020-03-07: qty 2

## 2020-03-07 NOTE — Progress Notes (Addendum)
          Daily Rounding Note - rounding for Dr Benson Norway  03/07/2020, 9:34 AM  LOS: 3 days   SUBJECTIVE:   Chief complaint: gastroparesis, N/V     Pt sensing A fib kicking in, rate to low 100s Tolerating solid meal last night and this AM.  Ate 100% of Am meal.  Loose stool x 3 yest.   Feels ok  OBJECTIVE:         Vital signs in last 24 hours:    Temp:  [97.8 F (36.6 C)-98.7 F (37.1 C)] 98.7 F (37.1 C) (07/18 0539) Pulse Rate:  [81-101] 101 (07/18 0539) Resp:  [18] 18 (07/18 0539) BP: (127-129)/(84-94) 129/84 (07/18 0539) SpO2:  [93 %-100 %] 93 % (07/18 0539) Last BM Date: 03/06/20 Filed Weights   03/04/20 0500 03/05/20 0500 03/06/20 0534  Weight: 121.1 kg 121.5 kg 120 kg   General: looks better, comfortabl   Heart: Irreg, Irreg.  HR 80s Chest: clear bil.  No dyspnea Abdomen: NT, obese.  Active BS  Extremities: no CCE Neuro/Psych:  Oriented x 3.  A bit anxious.  Pleasant.  Cooperative.     Lab Results: Recent Labs    03/05/20 0243 03/06/20 0716 03/07/20 0112  WBC 10.8* 8.8 10.3  HGB 11.6* 11.5* 10.7*  HCT 34.7* 35.4* 32.7*  PLT 217 201 220   BMET Recent Labs    03/05/20 0243 03/06/20 0716 03/07/20 0112  NA 135 137 138  K 3.5 3.6 4.4  CL 98 103 106  CO2 28 24 25   GLUCOSE 129* 139* 144*  BUN 8 5* 6*  CREATININE 1.46* 1.37* 1.35*  CALCIUM 8.5* 8.7* 8.8*   LFT Recent Labs    03/05/20 0243 03/06/20 0716 03/07/20 0112  PROT 6.6 6.7 6.7  ALBUMIN 2.7* 2.7* 2.7*  AST 16 18 17   ALT 12 14 13   ALKPHOS 66 75 78  BILITOT 0.8 0.5 <0.1*     Scheduled Meds: . dabigatran  150 mg Oral BID  . digoxin  125 mcg Oral Daily  . diltiazem  60 mg Oral Q8H  . DULoxetine  60 mg Oral Daily  . insulin aspart  0-15 Units Subcutaneous TID WC  . insulin glargine  40 Units Subcutaneous BID  . metoprolol tartrate  25 mg Oral BID  . pantoprazole  40 mg Oral BID  . potassium chloride  20 mEq Oral BID  . rosuvastatin  10  mg Oral Daily   Continuous Infusions: PRN Meds:.acetaminophen **OR** acetaminophen, albuterol, ondansetron **OR** ondansetron (ZOFRAN) IV, prochlorperazine, traMADol   ASSESMENT:   *   Acute flare diabetic gastroparesis.  ? superiimposed food borne illness.  sxs resolved.  Regaln discontinued as of midnight.   *   PAF.  On Pradaxa    *   [place] anemia.     PLAN   *   Dr Benson Norway will return tmrw, continue current mgt.       Azucena Freed  03/07/2020, 9:34 AM Phone [place] Attending   I have taken an interval history, reviewed the chart and examined the patient. I agree with the Advanced Practitioner's note, impression and recommendations.   ? Home later today - I think ok if [facility]H agrees though less chance bounce back if tomorrow I suspect  Gatha Mayer, MD, [facility] 03/07/2020 10:53 AM

## 2020-03-07 NOTE — Progress Notes (Signed)
Central monitoring called and stated her rate is sustaining in the 140s, will notify Dr. Alfredia Ferguson.

## 2020-03-07 NOTE — Progress Notes (Signed)
AM meds given. Dr. Alfredia Ferguson notified of events.

## 2020-03-07 NOTE — Progress Notes (Signed)
Called [facility] Nurses about pt and order for Cardizem drip, they will be up.

## 2020-03-07 NOTE — Progress Notes (Signed)
Pt's heart rate is in the 90s/Afib, pt is without any distress or chest pain or shortness of breath.

## 2020-03-07 NOTE — Progress Notes (Signed)
Pt heart rate now in the 80-90s. She is sitting up in the chair and has ambulated in the hallway with assistance.  R[facility] up to see pt. Called and discussed with Dr. Alfredia Ferguson and the Cardizem drip was DCd. Will cancel the transfer order and continue to closely monitor the pt. Pt agrees with this.

## 2020-03-07 NOTE — Progress Notes (Signed)
Pt states it feels like her a fib has kicked in, heart rate is irregular, notified Dr. Georgena Spurling her on cardiac monitoring, stating AFib in the 90s per central monitoring.  BP 117/79, pulse 89, sat 100 on RA.  Rate had been 172 on dinamapp and BP higher.

## 2020-03-07 NOTE — Progress Notes (Signed)
Patient refuses CPAP.  RT will continue to monitor. 

## 2020-03-07 NOTE — Progress Notes (Signed)
OT Cancellation Note  Patient Details Name: [name] MRN: [id number] DOB: [birth year]   Cancelled Treatment:    Reason Eval/Treat Not Completed: Medical issues which prohibited therapy. Spoke with RN patient in A-fib asked to check back later.  Delbert Phenix OT OT office: [phone]   Rosemary Holms 03/07/2020, 9:17 AM

## 2020-03-07 NOTE — Progress Notes (Signed)
PROGRESS NOTE    [name]  EUM:[id number] DOB: [birth year] DOA: 03/02/2020 PCP: [name], NP  Brief Narrative: HPI per [name] on 03/02/20 [name] is a 64 y.o. female with medical history significant of hypertension, diabetes mellitus type 2, insulin-dependent, hyperlipidemia, paroxysmal atrial fibrillation, chronic congestive heart failure with reduced ejection fraction (45-50% 06/2018), chronic obstructive pulmonary disease, obstructive sleep apnea on nightly CPAP, morbid obesity, chronic kidney disease stage III, with baseline creatinine 1.6, gastroesophageal reflux disease who came to hospital for evaluation of persistent nausea vomiting with diarrhea.  Patient stated that her symptoms started on Friday, after she had a roast beef sandwich.  Her symptoms include multiple episode of nausea with vomiting as well as diarrhea per day.  She described at times dark vomiting mixed with greenish vomiting, and similarly dark-colored diarrhea.  She denied having any bright red blood per rectum.  She reported diffuse abdominal pain, about 8/10, cramping of character, intermittent.  She had similar symptoms about a month ago, which resolved spontaneously, although there were not as prominent as today.  Denies any recent travel, and similar symptoms in any family members.  Due to symptoms she was unable to take all her medications, including medication for control of heart rate.  ED Course:  In the ED patient was found to be afebrile temperature 98.4 F.  Blood pressure was stable 117/94, however her heart rate was elevated to 113/min, with rhythm consistent with atrial fibrillation.  Patient was not hypoxic. CBC shows no leukocytosis or anemia, Hemoccult 14.3. Chemistry was positive for elevated creatinine 1.7, as well as mild hypokalemia 2.8 with hyponatremia 136.  Glucose 259, without evidence of DKA.  Noted negative troponin x2 and proBNP of 43.  Lipase and  LFTs within normal limits.  Chest x-ray showed no acute cardiopulmonary abnormalities.  Abdominal x-ray negative for acute finding.  Patient had previous CT abdomen pelvis on 6/11, with no acute findings. Patient was treated with oral Cardizem, metoprolol, given pantoprazole, IV potassium replacement and IV fluids, as well as pain control with IV morphine. Patient will be admitted to medical telemetry for close monitoring and further treatment  **Interim History  03/04/20 She states that her nausea is improving somewhat but continues have some diarrhea now is having some crampy abdominal pain.  Continues to have some abdominal pain.  States that she ate [employer] last week.  Continues to get IV fluid resuscitation because she is not improving will obtain a CT of the abdomen pelvis without contrast given her renal function.  We will discussed the case with [name] if she fails to improve we will get his consultation formally.  03/05/20  Continues to feel generally unwell and vomited this morning.  Continues to have diarrheal episodes and complains of abdominal pain.  CT scan of the abdomen pelvis and showed no acute intra-abdominal pathology.  GI feels that this may be prolonged food poisoning inducing a cascade of affect with potential exacerbation of her gastroparesis.  We will continue supportive care with IV hydration and continue with antiemetics.  Of note we started metoclopramide this morning scheduled however [name]  feels that it may be contributing to her diarrhea but it was just started this morning  03/06/2020 She continues to have some diarrhea and some nausea but did not actually vomit.  GI has advanced her diet to a soft diabetic diet and will continue metoclopramide until midnight tonight.  They recommend continuing current care and cut back on the  diet if she continues have more problems.  [name] is also added a digoxin level  03/07/20 Heart rate went into A. fib with  RVR this morning and now her heart rate is back down in the 80s to 90s rate control.  She has proximal atrial fibrillation and fluctuates in and out of A. fib.  States that her abdomen is hurting still but states that she is not as nauseous.  Tolerated the diet but did not like it.  Still having some diarrhea and had 3 loose stools yesterday.   Assessment & Plan:   Principal Problem:   Acute gastroenteritis Active Problems:   Morbid obesity (HCC)   OSA (obstructive sleep apnea)   Nausea vomiting and diarrhea   Diabetes mellitus type 2 in obese (HCC)   Atrial fibrillation with RVR (HCC)   Congestive heart failure with left ventricular diastolic dysfunction, acute (HCC)  Nausea vomiting and diarrhea, slowly improving Acute gastroenteritis versus diabetic gastroparesis or a combination of both,, slowly improving -Patient has ongoing symptoms concerning for either acute gastroenteritis or diabetic gastroparesis.  She started having symptoms last Friday after she ate at [employer] and this is persistent -Patient had vomiting this morning and continues to have some diarrhea -Obtain stool studies including C. difficile toxin as well as GI panel.  C. difficile was negative and the GI pathogen panel is also negative -IV fluid hydration has now stopped -Continue with antiemetics and analgesics.  Continue pantoprazole. -WBC went from 10.3 -> 12.0 -> 10.7 -> 10.8 -> 8.8 and today is 10.3 -Continue supportive care and will obtain a CT of the abdomen and pelvis without contrast -CT Scan of the abdomen pelvis showed "No acute intra-abdominal or intrapelvic process." -Continue to monitor closely -Started the patient on IV metoclopramide scheduled 10 mg every 8 hours and GI will stop this at midnight -Ensure blood sugars remain under 200 -GI recommends advancing to a soft diabetic diet and cutting back if necessary but she tolerated her diet last night and this morning.  Continues to have some loose bowel  movements nausea is improving -Eulas Post has now been discontinued  Atrial fibrillation with rapid ventricular response,  -Had been in A. fib with RVR on admission but then converted to normal sinus rhythm and now is back in A. fib with RVR this morning -Heart rate was poorly controlled, as patient missed her medications.  She converted back into A. fib this morning -Resumed diltiazem, metoprolol and digoxin.  Resume anticoagulation with Pradaxa. -Heart rates were sustained in the 140s so she was initially written for a Cardizem drip and a Cardizem bolus however then heart rates improved -Continue to monitor on telemetry carefully  -Just checking a digoxin level given the patient takes digoxin 125 mcg p.o. daily -Continue to monitor as she continues to be paroxysmal and was in normal sinus rhythm and converted earlier this morning to A. fib and then had a period where she had sustained 140s -I spoke with Dr. Terrence Dupont who recommended continuing current medications and transferring and placing on IV diltiazem if necessary  Chronic kidney disease stage IIIb, improving -Renal function seems to be close to baseline, creatinine 1.6. -Patient has no signs of volume overload hence continue with IV fluids, with potassium content. -Holding diuretics for now and may resume in the a.m. possibly but will hold for now given that she continues to have some diarrhea -We will resume IV fluids given her persistent diarrhea -Patient's BUNs/creatinine on from 15/1.74 on admission and is  now 5/1.37 -Avoid nephrotoxic medications, contrast dyes, hypotension and renally adjust medications  -Repeat CMP in a.m.  Diabetes mellitus type 2, poorly controlled -Patient currently has no evidence of DKA or hyperosmolar state. -Resume insulin regimen, with reduced dose, Lantus 40 units twice daily, with sliding scale coverage. -Resume carbohydrate consistent diet. -CBGs ranging from 79-163 -HbA1c is 10.0 -She will ll need  diabetes education coordinator evaluation for further recommendations  Hypokalemia -Patient's potassium this morning was 4.4 -Continue to monitor and replete as necessary -Repeat CMP in a.m.  Hypophosphatemia -Patient's phosphorus level 2.4 -Replete with p.o. K-Phos Neutral 500 mg x 1 -Continue to monitor and replete as necessary -Repeat CMP in a.m.  Chronic congestive heart failure with reduced ejection fraction 45 to 50% -Patient currently without signs of volume overload, no edema, clear chest x-ray. -Given signs of dehydration, holding diuretics.   -BNP was 43.7 but is not -Continue with Metoprolol tartrate 25 mg p.o. twice daily -Resumed IV fluids again today as above -Patient is + 2.086 L since admission -Continue to monitor for signs and symptoms of volume overload  Obstructive sleep apnea       -Continue nightly CPAP  Gastroesophageal Reflux Disease (GERD) -Continue Pantoprazole 40 mg po BID and she was negative for C. difficile  Hypomagnesemia  -Mag Level is now 2.0 today -Continue to Monitor and Replete as Necessary -Repeat Mag Level in the AM   Normocytic Anemia -Patient's hemoglobin/hematocrit has gone from 11.5/35.4 and is now 10.7/32.7 -Patient's hemoglobin/hematocrit dropped from 14.3/41.6 and is likely a dilutional drop given her amount of IV fluids that she got  -Checked Anemia Panel and showed an iron level of 25, U IBC of 174, TIBC 199, saturation ratios of 13%, ferritin level 222, folate level 7.4, vitamin B12 375 -Continue to monitor for signs and symptoms of bleeding; currently no overt bleeding noted Repeat CBC in a.m.  Morbid Obesity   -Estimated body mass index is 41.43 kg/m as calculated from the following:   Height as of 02/24/20: 5\' 7"  (1.702 m).   Weight as of this encounter: 120 kg.  -Continued Weight Loss and Dietary Counseling as Morbid Obesity complicates all hospital medical care  DVT prophylaxis: Anticoagulated with Pradaxa Code  Status: FULL CODE  Family Communication: No family present at bedside  Disposition Plan: Pending tolerance of p.o. diet and improvement in her electrolytes and resolution of her diarrhea will continue; will continue on the full code diet for now.  PT OT recommending home health-we will also need to make sure her heart rate remained stable and not in RVR prior to discharge  Status is: Inpatient  Remains inpatient appropriate because:Persistent severe electrolyte disturbances, Ongoing active pain requiring inpatient pain management, IV treatments appropriate due to intensity of illness or inability to take PO and Inpatient level of care appropriate due to severity of illness   Dispo: The patient is from: Home              Anticipated d/c is to: Home              Anticipated d/c date is: 2 days              Patient currently is not medically stable to d/c.  Consultants:   None   Procedures: None   Antimicrobials: Anti-infectives (From admission, onward)   None     Subjective: Seen and examined at bedside and states that her stomach is still hurting as an 8 out of 10.  States  that the nausea is gone but still having some loose bowel movements and had 3 episodes yesterday.  Tolerated her diet and did not vomit but subsequently this morning she felt her heart going to A. fib with RVR became diaphoretic.  Heart rate since distended and was in RVR and she is about to be transferred but then they came back down to the 80s and 90s.  We will continue her home medications and will watch her another night but anticipating discharge in next 24 to 48 hours if she is tolerating a diet if her heart rate remains stable.  Objective: Vitals:   03/06/20 0537 03/06/20 2047 03/07/20 0539 03/07/20 1528  BP: 133/72 (!) 127/94 129/84 124/73  Pulse: 60 81 (!) 101 83  Resp: 18 18 18 18   Temp: 98.8 F (37.1 C) 97.8 F (36.6 C) 98.7 F (37.1 C) (!) 97.4 F (36.3 C)  TempSrc: Oral Oral Oral Oral  SpO2:  97% 100% 93%   Weight:        Intake/Output Summary (Last 24 hours) at 03/07/2020 1651 Last data filed at 03/07/2020 1032 Gross per 24 hour  Intake 440 ml  Output 200 ml  Net 240 ml   Filed Weights   03/04/20 0500 03/05/20 0500 03/06/20 0534  Weight: 121.1 kg 121.5 kg 120 kg   Examination: Physical Exam:  Constitutional: WN/WD morbidly obese African-American female currently no acute distress sitting in up in the bed but felt a little diaphoretic this morning.  Complains of abdominal pain still. Eyes: Lids and conjunctivae normal, sclerae anicteric  ENMT: External Ears, Nose appear normal. Grossly normal hearing.  Neck: Appears normal, supple, no cervical masses, normal ROM, no appreciable thyromegaly: No JVD Respiratory: Diminished to auscultation bilaterally, no wheezing, rales, rhonchi or crackles. Normal respiratory effort and patient is not tachypenic. No accessory muscle use.  Unlabored breathing Cardiovascular: Irregularly irregular and slightly tachycardic, no murmurs / rubs / gallops. S1 and S2 auscultated.  Trace extremity edema Abdomen: Soft, tender to palpate slightly, distended secondary body habitus.  Bowel sounds positive.  GU: Deferred. Musculoskeletal: No clubbing / cyanosis of digits/nails. No joint deformity upper and lower extremities.  Skin: No rashes, lesions, ulcers on limited skin evaluation. No induration; Warm and dry.  Neurologic: CN 2-12 grossly intact with no focal deficits. Romberg sign and cerebellar reflexes not assessed.  Psychiatric: Normal judgment and insight. Alert and oriented x 3. Normal mood and appropriate affect.   Data Reviewed: I have personally reviewed following labs and imaging studies  CBC: Recent Labs  Lab 03/03/20 1117 03/04/20 0416 03/05/20 0243 03/06/20 0716 03/07/20 0112  WBC 12.0* 10.7* 10.8* 8.8 10.3  NEUTROABS 8.4* 7.0 7.0 6.3 6.9  HGB 13.2 11.9* 11.6* 11.5* 10.7*  HCT 39.7 35.8* 34.7* 35.4* 32.7*  MCV 88.8 87.3 88.3  89.4 89.6  PLT 215 214 217 201 841   Basic Metabolic Panel: Recent Labs  Lab 03/03/20 1117 03/04/20 0416 03/05/20 0243 03/06/20 0716 03/07/20 0112  NA 136 136 135 137 138  K 3.2* 3.4* 3.5 3.6 4.4  CL 94* 97* 98 103 106  CO2 29 29 28 24 25   GLUCOSE 210* 142* 129* 139* 144*  BUN 15 11 8  5* 6*  CREATININE 1.71* 1.53* 1.46* 1.37* 1.35*  CALCIUM 8.8* 8.7* 8.5* 8.7* 8.8*  MG 1.2* 2.2 1.9 1.7 2.0  PHOS 2.9 3.1 3.1 3.0 2.4*   GFR: Estimated Creatinine Clearance: 57.2 mL/min (A) (by C-G formula based on SCr of 1.35 mg/dL (H)). Liver Function  Tests: Recent Labs  Lab 03/03/20 1117 03/04/20 0416 03/05/20 0243 03/06/20 0716 03/07/20 0112  AST 24 19 16 18 17   ALT 16 13 12 14 13   ALKPHOS 78 69 66 75 78  BILITOT 0.8 0.5 0.8 0.5 <0.1*  PROT 7.3 6.8 6.6 6.7 6.7  ALBUMIN 3.2* 2.9* 2.7* 2.7* 2.7*   Recent Labs  Lab 03/02/20 1527  LIPASE 15   No results for input(s): AMMONIA in the last 168 hours. Coagulation Profile: Recent Labs  Lab 03/02/20 1527  INR 1.5*   Cardiac Enzymes: No results for input(s): CKTOTAL, CKMB, CKMBINDEX, TROPONINI in the last 168 hours. BNP (last 3 results) No results for input(s): PROBNP in the last 8760 hours. HbA1C: No results for input(s): HGBA1C in the last 72 hours. CBG: Recent Labs  Lab 03/06/20 1707 03/06/20 2051 03/07/20 0731 03/07/20 1135 03/07/20 1543  GLUCAP 137* 126* 79 163* 155*   Lipid Profile: No results for input(s): CHOL, HDL, LDLCALC, TRIG, CHOLHDL, LDLDIRECT in the last 72 hours. Thyroid Function Tests: No results for input(s): TSH, T4TOTAL, FREET4, T3FREE, THYROIDAB in the last 72 hours. Anemia Panel: Recent Labs    03/07/20 0112  VITAMINB12 375  FOLATE 7.4  FERRITIN 222  TIBC 199*  IRON 25*  RETICCTPCT 3.3*   Sepsis Labs: No results for input(s): PROCALCITON, LATICACIDVEN in the last 168 hours.  Recent Results (from the past 240 hour(s))  SARS Coronavirus 2 by RT PCR (hospital order, performed in [facility]  hospital lab) Nasopharyngeal Nasopharyngeal Swab     Status: None   Collection Time: 03/02/20  7:51 PM   Specimen: Nasopharyngeal Swab  Result Value Ref Range Status   SARS Coronavirus 2 NEGATIVE NEGATIVE Final    Comment: (NOTE) SARS-CoV-2 target nucleic acids are NOT DETECTED.  The SARS-CoV-2 RNA is generally detectable in upper and lower respiratory specimens during the acute phase of infection. The lowest concentration of SARS-CoV-2 viral copies this assay can detect is 250 copies / mL. A negative result does not preclude SARS-CoV-2 infection and should not be used as the sole basis for treatment or other patient management decisions.  A negative result may occur with improper specimen collection / handling, submission of specimen other than nasopharyngeal swab, presence of viral mutation(s) within the areas targeted by this assay, and inadequate number of viral copies (<250 copies / mL). A negative result must be combined with clinical observations, patient history, and epidemiological information.  Fact Sheet for Patients:   StrictlyIdeas.no  Fact Sheet for Healthcare Providers: BankingDealers.co.za  This test is not yet approved or  cleared by the [place] FDA and has been authorized for detection and/or diagnosis of SARS-CoV-2 by FDA under an Emergency Use Authorization (EUA).  This EUA will remain in effect (meaning this test can be used) for the duration of the COVID-19 declaration under Section 564(b)(1) of the Act, 21 U.S.C. section 360bbb-3(b)(1), unless the authorization is terminated or revoked sooner.  Performed at [facility], [place] [address] [place], [place] [address]   Gastrointestinal Panel by PCR , Stool     Status: None   Collection Time: 03/04/20 10:04 AM   Specimen: STOOL  Result Value Ref Range Status   Campylobacter species NOT DETECTED NOT DETECTED Final   Plesimonas shigelloides NOT  DETECTED NOT DETECTED Final   Salmonella species NOT DETECTED NOT DETECTED Final   Yersinia enterocolitica NOT DETECTED NOT DETECTED Final   Vibrio species NOT DETECTED NOT DETECTED Final   Vibrio cholerae NOT  DETECTED NOT DETECTED Final   Enteroaggregative E coli (EAEC) NOT DETECTED NOT DETECTED Final   Enteropathogenic E coli (EPEC) NOT DETECTED NOT DETECTED Final   Enterotoxigenic E coli (ETEC) NOT DETECTED NOT DETECTED Final   Shiga like toxin producing E coli (STEC) NOT DETECTED NOT DETECTED Final   Shigella/Enteroinvasive E coli (EIEC) NOT DETECTED NOT DETECTED Final   Cryptosporidium NOT DETECTED NOT DETECTED Final   Cyclospora cayetanensis NOT DETECTED NOT DETECTED Final   Entamoeba histolytica NOT DETECTED NOT DETECTED Final   Giardia lamblia NOT DETECTED NOT DETECTED Final   Adenovirus F40/41 NOT DETECTED NOT DETECTED Final   Astrovirus NOT DETECTED NOT DETECTED Final   Norovirus GI/GII NOT DETECTED NOT DETECTED Final   Rotavirus A NOT DETECTED NOT DETECTED Final   Sapovirus (I, II, IV, and V) NOT DETECTED NOT DETECTED Final    Comment: Performed at [facility], [address], [place], [place] [address]  C Difficile Quick Screen w PCR reflex     Status: None   Collection Time: 03/04/20 10:04 AM   Specimen: STOOL  Result Value Ref Range Status   C Diff antigen NEGATIVE NEGATIVE Final   C Diff toxin NEGATIVE NEGATIVE Final   C Diff interpretation No C. difficile detected.  Final    Comment: Performed at [facility], [place] [address], [place], [place] [address]    RN Pressure Injury Documentation:     Estimated body mass index is 41.43 kg/m as calculated from the following:   Height as of 02/24/20: 5\' 7"  (1.702 m).   Weight as of this encounter: 120 kg.  Malnutrition Type:      Malnutrition Characteristics:      Nutrition Interventions:    Radiology Studies: No results found. Scheduled Meds: . dabigatran  150 mg Oral BID  . digoxin  125 mcg  Oral Daily  . diltiazem  60 mg Oral Q8H  . DULoxetine  60 mg Oral Daily  . insulin aspart  0-15 Units Subcutaneous TID WC  . insulin glargine  40 Units Subcutaneous BID  . metoprolol tartrate  25 mg Oral BID  . pantoprazole  40 mg Oral BID  . potassium chloride  20 mEq Oral BID  . rosuvastatin  10 mg Oral Daily   Continuous Infusions:   LOS: 3 days   Kerney Elbe, DO [facility] PAGER is on [place]  If 7PM-7AM, please contact night-coverage [email or web address]

## 2020-03-08 LAB — CBC WITH DIFFERENTIAL/PLATELET
Abs Immature Granulocytes: 0.03 10*3/uL (ref 0.00–0.07)
Basophils Absolute: 0.1 10*3/uL (ref 0.0–0.1)
Basophils Relative: 1 %
Eosinophils Absolute: 0.2 10*3/uL (ref 0.0–0.5)
Eosinophils Relative: 2 %
HCT: 34.6 % — ABNORMAL LOW (ref 36.0–46.0)
Hemoglobin: 11.6 g/dL — ABNORMAL LOW (ref 12.0–15.0)
Immature Granulocytes: 0 %
Lymphocytes Relative: 28 %
Lymphs Abs: 2.9 10*3/uL (ref 0.7–4.0)
MCH: 30.2 pg (ref 26.0–34.0)
MCHC: 33.5 g/dL (ref 30.0–36.0)
MCV: 90.1 fL (ref 80.0–100.0)
Monocytes Absolute: 0.8 10*3/uL (ref 0.1–1.0)
Monocytes Relative: 8 %
Neutro Abs: 6.6 10*3/uL (ref 1.7–7.7)
Neutrophils Relative %: 61 %
Platelets: 259 10*3/uL (ref 150–400)
RBC: 3.84 MIL/uL — ABNORMAL LOW (ref 3.87–5.11)
RDW: 14.1 % (ref 11.5–15.5)
WBC: 10.6 10*3/uL — ABNORMAL HIGH (ref 4.0–10.5)
nRBC: 0 % (ref 0.0–0.2)

## 2020-03-08 LAB — COMPREHENSIVE METABOLIC PANEL
ALT: 14 U/L (ref 0–44)
AST: 26 U/L (ref 15–41)
Albumin: 2.8 g/dL — ABNORMAL LOW (ref 3.5–5.0)
Alkaline Phosphatase: 78 U/L (ref 38–126)
Anion gap: 6 (ref 5–15)
BUN: 10 mg/dL (ref 8–23)
CO2: 25 mmol/L (ref 22–32)
Calcium: 8.8 mg/dL — ABNORMAL LOW (ref 8.9–10.3)
Chloride: 104 mmol/L (ref 98–111)
Creatinine, Ser: 1.5 mg/dL — ABNORMAL HIGH (ref 0.44–1.00)
GFR calc Af Amer: 43 mL/min — ABNORMAL LOW (ref >60)
GFR calc non Af Amer: 37 mL/min — ABNORMAL LOW (ref >60)
Glucose, Bld: 112 mg/dL — ABNORMAL HIGH (ref 70–99)
Potassium: 5.2 mmol/L — ABNORMAL HIGH (ref 3.5–5.1)
Sodium: 135 mmol/L (ref 135–145)
Total Bilirubin: 0.9 mg/dL (ref 0.3–1.2)
Total Protein: 6.8 g/dL (ref 6.5–8.1)

## 2020-03-08 LAB — GLUCOSE, CAPILLARY
Glucose-Capillary: 170 mg/dL — ABNORMAL HIGH (ref 70–99)
Glucose-Capillary: 154 mg/dL — ABNORMAL HIGH (ref 70–99)

## 2020-03-08 LAB — PHOSPHORUS: Phosphorus: 3.2 mg/dL (ref 2.5–4.6)

## 2020-03-08 LAB — MAGNESIUM: Magnesium: 1.8 mg/dL (ref 1.7–2.4)

## 2020-03-08 MED ORDER — MAGNESIUM SULFATE IN D5W 1-5 GM/100ML-% IV SOLN
1.0000 g | Freq: Once | INTRAVENOUS | Status: AC
  Administered 2020-03-08: 1 g via INTRAVENOUS
  Filled 2020-03-08: qty 100

## 2020-03-08 MED ORDER — ONDANSETRON HCL 4 MG PO TABS: 4.0000 mg | ORAL_TABLET | Freq: Four times a day (QID) | ORAL | 0 refills | Status: DC | PRN

## 2020-03-08 MED ORDER — SODIUM ZIRCONIUM CYCLOSILICATE 10 G PO PACK
10.0000 g | PACK | Freq: Once | ORAL | Status: AC
  Administered 2020-03-08: 10 g via ORAL
  Filled 2020-03-08: qty 1

## 2020-03-08 MED ORDER — PANTOPRAZOLE SODIUM 40 MG PO TBEC: 40.0000 mg | DELAYED_RELEASE_TABLET | Freq: Two times a day (BID) | ORAL | 0 refills | Status: DC

## 2020-03-08 NOTE — Discharge Summary (Signed)
Physician Discharge Summary  [name] AVW:[id number] DOB: 09-Jun-1956 DOA: 03/02/2020  PCP: [name], NP  Admit date: 03/02/2020 Discharge date: 03/08/2020  Admitted From: Home Disposition: Home with Home Health PT/OT  Recommendations for Outpatient Follow-up:  1. Follow up with PCP in 1-2 weeks 2. Follow up with [name] within 1-2 weeks 3. Follow up with Cardiology Dr. Terrence Dupont within 1-2 weeks 4. Please obtain CMP/CBC, Mag, Phos in one week 5. Please follow up on the following pending results:  Home Health: Yes Equipment/Devices: No   Discharge Condition: Stable  CODE STATUS: FULL CODE Diet recommendation: Soft Heart Healthy Carb Modified Diet   Brief/Interim Summary: HPI per [name] on 03/02/20 [name] a 64 y.o.femalewith medical history significant ofhypertension, diabetes mellitus type2,insulin-dependent, hyperlipidemia,paroxysmal atrial fibrillation,chronic congestive heart failure withreducedejection fraction (45-50% 06/2018), chronic obstructive pulmonary disease,obstructive sleep apnea on nightly CPAP, morbid obesity,chronic kidney disease stage III,with baseline creatinine 1.6,gastroesophageal reflux disease who came to hospital for evaluation of persistent nausea vomiting with diarrhea.  Patient stated that her symptoms started on Friday,after she had a roast beef sandwich.Her symptoms include multiple episode of nausea with vomiting as well as diarrhea per day.She described at times dark vomiting mixed with greenish vomiting,and similarly dark-colored diarrhea.She denied having any bright red blood per rectum.She reported diffuse abdominal pain,about 8/10,cramping of character,intermittent.She had similar symptoms about a month ago,which resolved spontaneously,although there were not as prominent as today.Denies any recent travel, and similar symptoms in any family members.Due to  symptoms she was unable to take all her medications,including medication for control of heart rate.  ED Course: In the ED patient was found to be afebrile temperature 98.4 F.Blood pressure was stable 117/94,however her heart rate was elevated to 113/min,with rhythm consistent with atrial fibrillation.Patient was not hypoxic. CBC shows no leukocytosis or anemia,Hemoccult 14.3. Chemistry was positive for elevated creatinine 1.7,as well as mild hypokalemia 2.8 with hyponatremia 136.Glucose 259,without evidence of DKA.Noted negative troponin x2 and proBNP of 43.Lipase and LFTs within normal limits. Chest x-ray showed no acute cardiopulmonary abnormalities.Abdominal x-raynegative for acute finding. Patient had previous CT abdomen pelvis on 6/11,with no acute findings. Patient was treated with oral Cardizem, metoprolol, given pantoprazole, IV potassium replacement and IV fluids,as well as pain control with IV morphine. Patient will be admitted to medical telemetry for close monitoring and further treatment  **Interim History  03/04/20 She states that her nausea is improving somewhat but continues have some diarrhea now is having some crampy abdominal pain.  Continues to have some abdominal pain.  States that she ate [employer] last week.  Continues to get IV fluid resuscitation because she is not improving will obtain a CT of the abdomen pelvis without contrast given her renal function.  We will discussed the case with [name] if she fails to improve we will get his consultation formally.  03/05/20  Continues to feel generally unwell and vomited this morning.  Continues to have diarrheal episodes and complains of abdominal pain.  CT scan of the abdomen pelvis and showed no acute intra-abdominal pathology.  GI feels that this may be prolonged food poisoning inducing a cascade of affect with potential exacerbation of her gastroparesis.  We will continue supportive care  with IV hydration and continue with antiemetics.  Of note we started metoclopramide this morning scheduled however [name]  feels that it may be contributing to her diarrhea but it was just started this morning  03/06/2020 She continues to have some diarrhea and some  nausea but did not actually vomit.  GI has advanced her diet to a soft diabetic diet and will continue metoclopramide until midnight tonight.  They recommend continuing current care and cut back on the diet if she continues have more problems.  [name] is also added a digoxin level  03/07/20 Heart rate went into A. fib with RVR this morning and now her heart rate is back down in the 80s to 90s rate control.  She has proximal atrial fibrillation and fluctuates in and out of A. fib.  States that her abdomen is hurting still but states that she is not as nauseous.  Tolerated the diet but did not like it.  Still having some diarrhea and had 3 loose stools yesterday.  ** Patient felt better and she improved and she was not having more diarrhea.  Tolerated her diet without issues and no nausea or vomiting.  Heart rate was stable and still in A. fib but not tachycardic and she is deemed stable from a GI perspective to be discharged.  She will need to follow-up with PCP as well as cardiology as well as gastroenterology in the outpatient setting  Discharge Diagnoses:  Principal Problem:   Acute gastroenteritis Active Problems:   Morbid obesity (HCC)   OSA (obstructive sleep apnea)   Nausea vomiting and diarrhea   Diabetes mellitus type 2 in obese (HCC)   Atrial fibrillation with RVR (HCC)   Congestive heart failure with left ventricular diastolic dysfunction, acute (HCC)   Nausea vomiting and diarrhea, improved Acute gastroenteritis versus diabetic gastroparesis or a combination of both, improved -Patient has ongoing symptoms concerning for either acute gastroenteritis or diabetic gastroparesis.  She started having symptoms last  Friday after she ate at [employer] and this is persistent -Patient had vomiting this morning and continues to have some diarrhea -Obtain stool studies including C. difficile toxin as well as GI panel.  C. difficile was negative and the GI pathogen panel is also negative -IV fluid hydration has now stopped -Continue with antiemetics and analgesics.Continue pantoprazole. -WBC went from 10.3 -> 12.0 -> 10.7 -> 10.8 -> 8.8 and yesterday is 10.3 and today is slightly higher at 10.6 -Continue supportive care and will obtain a CT of the abdomen and pelvis without contrast -CT Scan of the abdomen pelvis showed "No acute intra-abdominal or intrapelvic process." -Continue to monitor closely -Started the patient on IV metoclopramide scheduled 10 mg every 8 hours and GI will stop this at midnight -Ensure blood sugars remain under 200 -GI recommends advancing to a soft diabetic diet and cutting back if necessary but she tolerated her diet last night and this morning.  Continues to have some loose bowel movements nausea is improving -Follow-up with GI within 1 to 2 weeks  Atrial fibrillation with rapid ventricular response,  -Had been in A. fib with RVR on admission but then converted to normal sinus rhythm and now is back in A. fib with RVR this morning -Heart rate was poorly controlled,as patient missed her medications.  She converted back into A. fib this morning -Resumed diltiazem,metoprololand digoxin.Resume anticoagulation with Pradaxa. -Heart rates were sustained in the 140s so she was initially written for a Cardizem drip and a Cardizem bolus however then heart rates improved -Continue to monitor on telemetry carefully  -Just checking a digoxin level given the patient takes digoxin 125 mcg p.o. daily -Continue to monitor as she continues to be paroxysmal and was in normal sinus rhythm and converted earlier this morning to A.  fib and then had a period where she had sustained 140s -I spoke with Dr.  Terrence Dupont who recommended continuing current medications and transferring and placing on IV diltiazem if necessary; her heart rate improved without the diltiazem and it remained stable and rate controlled -Resume home medications and follow-up with Dr. Terrence Dupont within 1 week  Chronic kidney disease stage IIIb, improving -Renal function seems to be close to baseline,creatinine 1.6. -Patient has no signs of volume overload hence continue with IV fluids,with potassium content. -Holding diuretics for now and may resume in the a.m. possibly but will hold for now given that she continues to have some diarrhea --For hydration is now stopped -Patient's BUNs/creatinine on from 15/1.74 on admission and trended down to 5/1.37 and today it is 10/1.50 -Avoid nephrotoxic medications, contrast dyes, hypotension and renally adjust medications  -Repeat CMP within 1 week  Hyperkalemia  -In the setting of potassium supplementation -Hold potassium supplementation and give a dose of Lokelma 10 mg as well -Continue monitor and trend and repeat CMP in the outpatient setting  Diabetes mellitus type 2, poorly controlled -Patient currently has no evidence of DKA or hyperosmolar state. -Resume insulin regimen,with reduced dose,Lantus 40 units twice daily,with sliding scale coverage. -Resume carbohydrate consistent diet. -CBGs ranging from 79-170 -HbA1c is 10.0 -She will ll need diabetes education coordinator evaluation for further recommendations -Resume home insulin at discharge  Hypokalemia -Patient's potassium this morning was 4.4 -Continue to monitor and replete as necessary -Repeat CMP in a.m.  Hypophosphatemia -Patient's phosphorus level is 3.2 -Continue to monitor and replete as necessary -Repeat CMP within 1 week  Chronic congestive heart failure with reduced ejection fraction 45 to 50% -Patient currently without signs of volume overload,no edema, clear chest x-ray. -Given signs of  dehydration, holding diuretics. -BNP was 43.7 but is not -Continue with Metoprolol tartrate 25 mg p.o. twice daily -Resumed IV fluids again today as above -Patient is +  2.446 L since admission -Continue to monitor for signs and symptoms of volume overload  Obstructive sleep apnea -Continue nightlyCPAP  Gastroesophageal Reflux Disease (GERD) -Continue Pantoprazole 40 mg po BID and she was negative for C. difficile  Hypomagnesemia  -Mag Level is now 1.8 today -Continue to Monitor and Replete as Necessary -Repeat Mag Level in the AM   Normocytic Anemia -Patient's hemoglobin/hematocrit  is stable and now 11.6/34.7 -Patient's hemoglobin/hematocrit dropped from 14.3/41.6 and is likely a dilutional drop given her amount of IV fluids that she got  -Checked Anemia Panel and showed an iron level of 25, U IBC of 174, TIBC 199, saturation ratios of 13%, ferritin level 222, folate level 7.4, vitamin B12 375 -Continue to monitor for signs and symptoms of bleeding; currently no overt bleeding noted -Repeat CBC in outpatient setting  Morbid Obesity -Estimated body mass index is 43.06 kg/m as calculated from the following:   Height as of 02/24/20: _0  (1.702 m).   Weight as of this encounter: 124.7 kg. -Continued Weight Loss and Dietary Counseling as Morbid Obesity complicates all hospital medical care   Discharge Instructions  Discharge Instructions    AMB Referral to [facility]   Complete by: As directed    [facility]:  Hospital referral diabetes follow up.  Please assign to [facility] Coordinator for complex care and disease management follow up calls and assess for further needs.  Patient 's A1C 10.0 .  Questions please call:   Natividad Brood, RN BSN [facility] Liaison  [phone] business  mobile phone Toll free office [phone]  Fax number: [phone] [email or web address]   Reason  for consult: Diabetes follow up and management   Diagnoses of:  Diabetes Other     Other Diagnosis: gastroenter   Expected date of contact: 1-3 days (reserved for hospital discharges)   Call MD for:  difficulty breathing, headache or visual disturbances   Complete by: As directed    Call MD for:  extreme fatigue   Complete by: As directed    Call MD for:  hives   Complete by: As directed    Call MD for:  persistant dizziness or light-headedness   Complete by: As directed    Call MD for:  persistant nausea and vomiting   Complete by: As directed    Call MD for:  redness, tenderness, or signs of infection (pain, swelling, redness, odor or green/yellow discharge around incision site)   Complete by: As directed    Call MD for:  severe uncontrolled pain   Complete by: As directed    Call MD for:  temperature >100.4   Complete by: As directed    Diet - low sodium heart healthy   Complete by: As directed    Carb Modified Diet SOFT   Diet Carb Modified   Complete by: As directed    Discharge instructions   Complete by: As directed    You were cared for by a hospitalist during your hospital stay. If you have any questions about your discharge medications or the care you received while you were in the hospital after you are discharged, you can call the unit and ask to speak with the hospitalist on call if the hospitalist that took care of you is not available. Once you are discharged, your primary care physician will handle any further medical issues. Please note that NO REFILLS for any discharge medications will be authorized once you are discharged, as it is imperative that you return to your primary care physician (or establish a relationship with a primary care physician if you do not have one) for your aftercare needs so that they can reassess your need for medications and monitor your lab values.  Follow up with PCP, Gastroenterology, and Cardiology within 1 week. Take all medications as  prescribed. If symptoms change or worsen please return to the ED for evaluation   Increase activity slowly   Complete by: As directed      Allergies as of 03/08/2020      Reactions   Sulfa Antibiotics Itching      Medication List    STOP taking these medications   potassium chloride 10 MEQ CR capsule Commonly known as: MICRO-K     TAKE these medications   Accu-Chek FastClix Lancets Misc Use as directed three times daily   albuterol 108 (90 Base) MCG/ACT inhaler Commonly known as: VENTOLIN HFA Inhale 2 puffs into the lungs every 4 (four) hours as needed for wheezing or shortness of breath.   B-D SINGLE USE SWABS REGULAR Pads   dabigatran 150 MG Caps capsule Commonly known as: Pradaxa Take 1 capsule (150 mg total) by mouth 2 (two) times daily.   digoxin 0.125 MG tablet Commonly known as: LANOXIN Take 125 mcg by mouth daily.   diltiazem 60 MG tablet Commonly known as: CARDIZEM Take 1 tablet (60 mg total) by mouth every 8 (eight) hours. What changed: when to take this   DULoxetine 60 MG capsule Commonly known as: CYMBALTA Take 1 capsule (60 mg total) by  mouth daily.   furosemide 40 MG tablet Commonly known as: Lasix Take 1 tablet (40 mg total) by mouth daily. What changed: when to take this   Insulin Syringe-Needle U-100 31G X 5/16" 0.3 ML Misc Commonly known as: TRUEplus Insulin Syringe Use to administer insulin every day.   Levemir 100 UNIT/ML injection Generic drug: insulin detemir INJECT 0.4 MLS (40 UNITS TOTAL) INTO THE SKIN 2 (TWO) TIMES DAILY. What changed: See the new instructions.   losartan 25 MG tablet Commonly known as: COZAAR Take 25 mg by mouth daily.   metolazone 5 MG tablet Commonly known as: ZAROXOLYN Take 5 mg by mouth daily.   metoprolol tartrate 25 MG tablet Commonly known as: LOPRESSOR Take 1 tablet (25 mg total) by mouth 2 (two) times daily. What changed: when to take this   MULTIVITAMIN WOMEN PO Take 1 tablet by mouth daily.    nitroGLYCERIN 0.4 MG SL tablet Commonly known as: NITROSTAT Place 1 tablet (0.4 mg total) under the tongue every 5 (five) minutes x 3 doses as needed for chest pain.   NovoLOG 100 UNIT/ML injection Generic drug: insulin aspart INJECT 8 UNITS INTO THE SKIN 3 (THREE) TIMES DAILY WITH MEALS. TAKE ONLY WHEN YOUR EAT MORE THAN 50% OF YOUR MEALS What changed: See the new instructions.   ondansetron 4 MG tablet Commonly known as: ZOFRAN Take 1 tablet (4 mg total) by mouth every 6 (six) hours as needed for nausea.   pantoprazole 40 MG tablet Commonly known as: PROTONIX Take 1 tablet (40 mg total) by mouth 2 (two) times daily. What changed: when to take this   potassium chloride SA 20 MEQ tablet Commonly known as: KLOR-CON Take 20 mEq by mouth daily.   Restasis 0.05 % ophthalmic emulsion Generic drug: cycloSPORINE Place 1 drop into both eyes 2 (two) times daily.   rosuvastatin 10 MG tablet Commonly known as: CRESTOR Take 10 mg by mouth daily.   sucralfate 1 g tablet Commonly known as: Carafate Take 1 tablet (1 g total) by mouth 4 (four) times daily as needed. What changed: reasons to take this   traMADol 50 MG tablet Commonly known as: ULTRAM Take 1 tablet (50 mg total) by mouth every 8 (eight) hours as needed. What changed: reasons to take this   True Metrix Blood Glucose Test test strip Generic drug: glucose blood Use as instructed to check blood sugar daily.   True Metrix Meter w/Device Kit Use to check blood sugar daily.   Trulicity 1.5 PX/1.0GY Sopn Generic drug: Dulaglutide Inject 1.5 mg into the skin once a week. What changed: when to take this       Follow-up Information    Home, [facility] Follow up.   Specialty: Home Health Services Why: [facility] PT/OT arranged- they will contact you post discharge to set up home visits Contact information: [address] [place] [address] [phone]              Allergies  Allergen Reactions  . Sulfa  Antibiotics Itching   Consultations:  Gastroenterology  Discussed the Case with Dr. Terrence Dupont of Cardiology  Procedures/Studies: CT ABDOMEN PELVIS WO CONTRAST  Result Date: 03/04/2020 CLINICAL DATA:  Acute gastroenteritis EXAM: CT ABDOMEN AND PELVIS WITHOUT CONTRAST TECHNIQUE: Multidetector CT imaging of the abdomen and pelvis was performed following the standard protocol without IV contrast. COMPARISON:  03/02/2020, 01/30/2020 FINDINGS: Lower chest: No acute pleural or parenchymal lung disease. The heart is enlarged but stable. Hepatobiliary: Choose 2 Pancreas:  Unremarkable. No pancreatic ductal dilatation or surrounding inflammatory changes. Spleen: Normal in size without focal abnormality. Adrenals/Urinary Tract: Adrenal glands are unremarkable. Kidneys are normal, without renal calculi, focal lesion, or hydronephrosis. Bladder is unremarkable. Stomach/Bowel: No bowel obstruction or ileus. Normal appendix right lower quadrant. No bowel wall thickening or inflammatory change. Vascular/Lymphatic: Aortic atherosclerosis. Stable left renal artery aneurysms. No enlarged abdominal or pelvic lymph nodes. Reproductive: Status post hysterectomy. No adnexal masses. Other: No abdominal wall hernia or abnormality. No abdominopelvic ascites. Musculoskeletal: No acute or destructive bony lesions. Reconstructed images demonstrate no additional findings. IMPRESSION: No acute intra-abdominal or intrapelvic process. Electronically Signed   By: Randa Ngo M.D.   On: 03/04/2020 16:27   DG Abdomen 1 View  Result Date: 03/02/2020 CLINICAL DATA:  Abdominal pain and diarrhea EXAM: ABDOMEN - 1 VIEW COMPARISON:  None. FINDINGS: The bowel gas pattern is normal. Surgical clips are seen in the right upper quadrant. No radio-opaque calculi or other significant radiographic abnormality are seen. IMPRESSION: Negative. Electronically Signed   By: Prudencio Pair M.D.   On: 03/02/2020 20:25   DG Chest Portable 1 View  Result  Date: 03/02/2020 CLINICAL DATA:  Chest pain EXAM: PORTABLE CHEST 1 VIEW COMPARISON:  02/29/2020 FINDINGS: Cardiomegaly. No consolidation or edema. No pleural effusion or pneumothorax. The visualized skeletal structures are unremarkable. IMPRESSION: No acute process in the chest.  Stable cardiomegaly. Electronically Signed   By: Macy Mis M.D.   On: 03/02/2020 14:58     Subjective: Seen and examined and felt well but did not sleep very well last night.  No nausea or vomiting.  Denies any more diarrhea.  Feels better and is stable to be discharged home as she has no acute issues anymore.  She understands agrees with plan of care she will follow up with her PCP, cardiologist as well as her gastroenterologist within 1 to 2 weeks.  Discharge Exam: Vitals:   03/07/20 2022 03/08/20 0539  BP: 124/72 127/74  Pulse: 82 94  Resp: 16 18  Temp: 98.5 F (36.9 C) 98.1 F (36.7 C)  SpO2: 100% 96%   Vitals:   03/07/20 0539 03/07/20 1528 03/07/20 2022 03/08/20 0539  BP: 129/84 124/73 124/72 127/74  Pulse: (!) 101 83 82 94  Resp: _0 Temp: 98.7 F (37.1 C) (!) 97.4 F (36.3 C) 98.5 F (36.9 C) 98.1 F (36.7 C)  TempSrc: Oral Oral Axillary   SpO2: 93%  100% 96%  Weight:    124.7 kg   General: Pt is alert, awake, not in acute distress Cardiovascular: Irregularly Irregular, S1/S2 +, no rubs, no gallops Respiratory: Diminished bilaterally, no wheezing, no rhonchi Abdominal: Soft, Slightly tender to palpate, Distended 2/2 body habitus, bowel sounds + Extremities: Minimal edema, no cyanosis  The results of significant diagnostics from this hospitalization (including imaging, microbiology, ancillary and laboratory) are listed below for reference.     Microbiology: Recent Results (from the past 240 hour(s))  SARS Coronavirus 2 by RT PCR (hospital order, performed in [facility] hospital lab) Nasopharyngeal Nasopharyngeal Swab     Status: None   Collection Time: 03/02/20  7:51 PM    Specimen: Nasopharyngeal Swab  Result Value Ref Range Status   SARS Coronavirus 2 NEGATIVE NEGATIVE Final    Comment: (NOTE) SARS-CoV-2 target nucleic acids are NOT DETECTED.  The SARS-CoV-2 RNA is generally detectable in upper and lower respiratory specimens during the acute phase of infection. The lowest concentration of SARS-CoV-2 viral copies this assay can  detect is 250 copies / mL. A negative result does not preclude SARS-CoV-2 infection and should not be used as the sole basis for treatment or other patient management decisions.  A negative result may occur with improper specimen collection / handling, submission of specimen other than nasopharyngeal swab, presence of viral mutation(s) within the areas targeted by this assay, and inadequate number of viral copies (<250 copies / mL). A negative result must be combined with clinical observations, patient history, and epidemiological information.  Fact Sheet for Patients:   StrictlyIdeas.no  Fact Sheet for Healthcare Providers: BankingDealers.co.za  This test is not yet approved or  cleared by the [place] FDA and has been authorized for detection and/or diagnosis of SARS-CoV-2 by FDA under an Emergency Use Authorization (EUA).  This EUA will remain in effect (meaning this test can be used) for the duration of the COVID-19 declaration under Section 564(b)(1) of the Act, 21 U.S.C. section 360bbb-3(b)(1), unless the authorization is terminated or revoked sooner.  Performed at [facility], [place] [address] [place], [place] [address]   Gastrointestinal Panel by PCR , Stool     Status: None   Collection Time: 03/04/20 10:04 AM   Specimen: STOOL  Result Value Ref Range Status   Campylobacter species NOT DETECTED NOT DETECTED Final   Plesimonas shigelloides NOT DETECTED NOT DETECTED Final   Salmonella species NOT DETECTED NOT DETECTED Final   Yersinia enterocolitica NOT  DETECTED NOT DETECTED Final   Vibrio species NOT DETECTED NOT DETECTED Final   Vibrio cholerae NOT DETECTED NOT DETECTED Final   Enteroaggregative E coli (EAEC) NOT DETECTED NOT DETECTED Final   Enteropathogenic E coli (EPEC) NOT DETECTED NOT DETECTED Final   Enterotoxigenic E coli (ETEC) NOT DETECTED NOT DETECTED Final   Shiga like toxin producing E coli (STEC) NOT DETECTED NOT DETECTED Final   Shigella/Enteroinvasive E coli (EIEC) NOT DETECTED NOT DETECTED Final   Cryptosporidium NOT DETECTED NOT DETECTED Final   Cyclospora cayetanensis NOT DETECTED NOT DETECTED Final   Entamoeba histolytica NOT DETECTED NOT DETECTED Final   Giardia lamblia NOT DETECTED NOT DETECTED Final   Adenovirus F40/41 NOT DETECTED NOT DETECTED Final   Astrovirus NOT DETECTED NOT DETECTED Final   Norovirus GI/GII NOT DETECTED NOT DETECTED Final   Rotavirus A NOT DETECTED NOT DETECTED Final   Sapovirus (I, II, IV, and V) NOT DETECTED NOT DETECTED Final    Comment: Performed at [facility], [address] [place], [place] [address]  C Difficile Quick Screen w PCR reflex     Status: None   Collection Time: 03/04/20 10:04 AM   Specimen: STOOL  Result Value Ref Range Status   C Diff antigen NEGATIVE NEGATIVE Final   C Diff toxin NEGATIVE NEGATIVE Final   C Diff interpretation No C. difficile detected.  Final    Comment: Performed at [facility], [place] [address] [place], [place] [address]    Labs: BNP (last 3 results) Recent Labs    09/24/19 1935 01/15/20 1300 03/02/20 1527  BNP 30.3 92.0 22.0   Basic Metabolic Panel: Recent Labs  Lab 03/04/20 0416 03/05/20 0243 03/06/20 0716 03/07/20 0112 03/08/20 0308  NA 136 135 137 138 135  K 3.4* 3.5 3.6 4.4 5.2*  CL 97* 98 103 106 104  CO2 _0 GLUCOSE 142* 129* 139* 144* 112*  BUN 11 8 5* 6* 10  CREATININE 1.53* 1.46* 1.37* 1.35* 1.50*  CALCIUM 8.7* 8.5* 8.7* 8.8*  8.8*  MG 2.2 1.9 1.7 2.0 1.8  PHOS 3.1 3.1 3.0 2.4* 3.2    Liver Function Tests: Recent Labs  Lab 03/04/20 0416 03/05/20 0243 03/06/20 0716 03/07/20 0112 03/08/20 0308  AST _0 ALT _1 ALKPHOS 69 66 75 78 78  BILITOT 0.5 0.8 0.5 <0.1* 0.9  PROT 6.8 6.6 6.7 6.7 6.8  ALBUMIN 2.9* 2.7* 2.7* 2.7* 2.8*   Recent Labs  Lab 03/02/20 1527  LIPASE 15   No results for input(s): AMMONIA in the last 168 hours. CBC: Recent Labs  Lab 03/04/20 0416 03/05/20 0243 03/06/20 0716 03/07/20 0112 03/08/20 0308  WBC 10.7* 10.8* 8.8 10.3 10.6*  NEUTROABS 7.0 7.0 6.3 6.9 6.6  HGB 11.9* 11.6* 11.5* 10.7* 11.6*  HCT 35.8* 34.7* 35.4* 32.7* 34.6*  MCV 87.3 88.3 89.4 89.6 90.1  PLT 214 217 201 220 259   Cardiac Enzymes: No results for input(s): CKTOTAL, CKMB, CKMBINDEX, TROPONINI in the last 168 hours. BNP: Invalid input(s): POCBNP CBG: Recent Labs  Lab 03/07/20 1135 03/07/20 1543 03/07/20 2025 03/08/20 0823 03/08/20 1240  GLUCAP 163* 155* 163* 170* 154*   D-Dimer No results for input(s): DDIMER in the last 72 hours. Hgb A1c No results for input(s): HGBA1C in the last 72 hours. Lipid Profile No results for input(s): CHOL, HDL, LDLCALC, TRIG, CHOLHDL, LDLDIRECT in the last 72 hours. Thyroid function studies No results for input(s): TSH, T4TOTAL, T3FREE, THYROIDAB in the last 72 hours.  Invalid input(s): FREET3 Anemia work up Recent Labs    03/07/20 0112  VITAMINB12 375  FOLATE 7.4  FERRITIN 222  TIBC 199*  IRON 25*  RETICCTPCT 3.3*   Urinalysis    Component Value Date/Time   COLORURINE YELLOW 03/06/2020 1413   APPEARANCEUR CLEAR 03/06/2020 1413   LABSPEC 1.005 03/06/2020 1413   PHURINE 5.0 03/06/2020 1413   GLUCOSEU NEGATIVE 03/06/2020 1413   HGBUR SMALL (A) 03/06/2020 1413   BILIRUBINUR NEGATIVE 03/06/2020 1413   BILIRUBINUR negative 09/24/2019 1742   KETONESUR NEGATIVE 03/06/2020 1413   PROTEINUR NEGATIVE 03/06/2020 1413   UROBILINOGEN 1.0 09/24/2019 1742   UROBILINOGEN 1.0 10/26/2014 0249    NITRITE NEGATIVE 03/06/2020 1413   LEUKOCYTESUR NEGATIVE 03/06/2020 1413   Sepsis Labs Invalid input(s): PROCALCITONIN,  WBC,  LACTICIDVEN Microbiology Recent Results (from the past 240 hour(s))  SARS Coronavirus 2 by RT PCR (hospital order, performed in [place] hospital lab) Nasopharyngeal Nasopharyngeal Swab     Status: None   Collection Time: 03/02/20  7:51 PM   Specimen: Nasopharyngeal Swab  Result Value Ref Range Status   SARS Coronavirus 2 NEGATIVE NEGATIVE Final    Comment: (NOTE) SARS-CoV-2 target nucleic acids are NOT DETECTED.  The SARS-CoV-2 RNA is generally detectable in upper and lower respiratory specimens during the acute phase of infection. The lowest concentration of SARS-CoV-2 viral copies this assay can detect is 250 copies / mL. A negative result does not preclude SARS-CoV-2 infection and should not be used as the sole basis for treatment or other patient management decisions.  A negative result may occur with improper specimen collection / handling, submission of specimen other than nasopharyngeal swab, presence of viral mutation(s) within the areas targeted by this assay, and inadequate number of viral copies (<250 copies / mL). A negative result must be combined with clinical observations, patient history, and epidemiological information.  Fact Sheet for Patients:   StrictlyIdeas.no  Fact Sheet for Healthcare Providers: BankingDealers.co.za  This test is not  yet approved or  cleared by the [place] and has been authorized for detection and/or diagnosis of SARS-CoV-2 by FDA under an Emergency Use Authorization (EUA).  This EUA will remain in effect (meaning this test can be used) for the duration of the COVID-19 declaration under Section 564(b)(1) of the Act, 21 U.S.C. section 360bbb-3(b)(1), unless the authorization is terminated or revoked sooner.  Performed at [facility], [place] [address], [place], [place] [address]   Gastrointestinal Panel by PCR , Stool     Status: None   Collection Time: 03/04/20 10:04 AM   Specimen: STOOL  Result Value Ref Range Status   Campylobacter species NOT DETECTED NOT DETECTED Final   Plesimonas shigelloides NOT DETECTED NOT DETECTED Final   Salmonella species NOT DETECTED NOT DETECTED Final   Yersinia enterocolitica NOT DETECTED NOT DETECTED Final   Vibrio species NOT DETECTED NOT DETECTED Final   Vibrio cholerae NOT DETECTED NOT DETECTED Final   Enteroaggregative E coli (EAEC) NOT DETECTED NOT DETECTED Final   Enteropathogenic E coli (EPEC) NOT DETECTED NOT DETECTED Final   Enterotoxigenic E coli (ETEC) NOT DETECTED NOT DETECTED Final   Shiga like toxin producing E coli (STEC) NOT DETECTED NOT DETECTED Final   Shigella/Enteroinvasive E coli (EIEC) NOT DETECTED NOT DETECTED Final   Cryptosporidium NOT DETECTED NOT DETECTED Final   Cyclospora cayetanensis NOT DETECTED NOT DETECTED Final   Entamoeba histolytica NOT DETECTED NOT DETECTED Final   Giardia lamblia NOT DETECTED NOT DETECTED Final   Adenovirus F40/41 NOT DETECTED NOT DETECTED Final   Astrovirus NOT DETECTED NOT DETECTED Final   Norovirus GI/GII NOT DETECTED NOT DETECTED Final   Rotavirus A NOT DETECTED NOT DETECTED Final   Sapovirus (I, II, IV, and V) NOT DETECTED NOT DETECTED Final    Comment: Performed at [facility], [place] [place], [place] [address]  C Difficile Quick Screen w PCR reflex     Status: None   Collection Time: 03/04/20 10:04 AM   Specimen: STOOL  Result Value Ref Range Status   C Diff antigen NEGATIVE NEGATIVE Final   C Diff toxin NEGATIVE NEGATIVE Final   C Diff interpretation No C. difficile detected.  Final    Comment: Performed at [facility], [place] [address] [place], [place] [address]   Time coordinating discharge: 35 minutes  SIGNED:  Kerney Elbe, DO [facility] 03/08/2020, 7:08 PM Pager is on  [place]  If 7PM-7AM, please contact night-coverage [email or web address]

## 2020-03-08 NOTE — Progress Notes (Signed)
[name] to be D/C'd  per MD order. Discussed with the patient and all questions fully answered.  VSS, Skin clean, dry and intact without evidence of skin break down, no evidence of skin tears noted.  IV catheter discontinued intact. Site without signs and symptoms of complications. Dressing and pressure applied.  An After Visit Summary was printed and given to the patient. Patient received prescription.  D/c education completed with patient/family including follow up instructions, medication list, d/c activities limitations if indicated, with other d/c instructions as indicated by MD - patient able to verbalize understanding, all questions fully answered.   Patient instructed to return to ED, call 911, or call MD for any changes in condition.   Patient to be escorted via [place], and D/C home via private auto.

## 2020-03-08 NOTE — TOC Transition Note (Addendum)
Transition of Care (TOC) - CM/SW Discharge Note [name] RN, BSN Transitions of Care Unit 4E- RN Case Manager See Treatment Team for direct phone # Cross Coverage for 6N   Patient Details  Name: [name] MRN: [id number] Date of Birth: 08/15/1956  Transition of Care [facility]) CM/SW Contact:  Dawayne Patricia, RN Phone Number: 03/08/2020, 11:44 AM   Clinical Narrative:    Pt stable for transition home today, Orders placed for HHPT/OT- CM spoke with pt at bedside regarding transition needs. Pt is agreeable to [facility] services- states she has had them in past- has used both [facility] and [facility]- list provided for [facility] choice Per CMS guidelines from medicare.gov website with star ratings (copy placed in shadow chart)- per pt she would like to use [facility] again for [facility] needs this time. Pt reports she has needed DME- no DME at this time. Has transport home this afternoon after 12 noon.  Call made to [place] with [facility] for [facility] referral- PT/OT needs- awaiting return call to confirm if they can accept referral.  1150- received msg. Back from [place] with [facility]- they can accept referral for PT/OT and will call patient for start of care for PT/OT services post discharge.    Final next level of care: [place] Barriers to Discharge: No Barriers Identified   Patient Goals and CMS Choice Patient states their goals for this hospitalization and ongoing recovery are:: "to get more strength" CMS Medicare.gov Compare Post Acute Care list provided to:: Patient Choice offered to / list presented to : Patient  Discharge Placement                   Home with [facility]    Discharge Plan and Services   Discharge Planning Services: CM Consult Post Acute Care Choice: Home Health          DME Arranged: N/A DME Agency: NA       HH Arranged: PT, OT [place] Agency: [facility] [employer] Date [place]: 03/08/20 Time [place]: 1143 Representative  spoke with at [place]: [place] Interventions     Readmission Risk Interventions Readmission Risk Prevention Plan 03/08/2020  Transportation Screening Complete  Medication Review Press photographer) Complete  PCP or Specialist appointment within 3-5 days of discharge Complete  HRI or [place] Complete  SW Recovery Care/Counseling Consult Complete  [place] Not Applicable  Some recent data might be hidden

## 2020-03-08 NOTE — Plan of Care (Signed)

## 2020-03-08 NOTE — Progress Notes (Signed)
Subjective: Feeling much better.  Tolerating PO and no further diarrhea.  Objective: Vital signs in last 24 hours: Temp:  [97.4 F (36.3 C)-98.5 F (36.9 C)] 98.1 F (36.7 C) (07/19 0539) Pulse Rate:  [82-94] 94 (07/19 0539) Resp:  [16-18] 18 (07/19 0539) BP: (124-127)/(72-74) 127/74 (07/19 0539) SpO2:  [96 %-100 %] 96 % (07/19 0539) Weight:  [124.7 kg] 124.7 kg (07/19 0539) Last BM Date: 03/07/20  Intake/Output from previous day: 07/18 0701 - 07/19 0700 In: 360 [P.O.:360] Out: 200 [Urine:200] Intake/Output this shift: No intake/output data recorded.  General appearance: alert and no distress GI: soft, non-tender; bowel sounds normal; no masses,  no organomegaly  Lab Results: Recent Labs    03/06/20 0716 03/07/20 0112 03/08/20 0308  WBC 8.8 10.3 10.6*  HGB 11.5* 10.7* 11.6*  HCT 35.4* 32.7* 34.6*  PLT 201 220 259   BMET Recent Labs    03/06/20 0716 03/07/20 0112 03/08/20 0308  NA 137 138 135  K 3.6 4.4 5.2*  CL 103 106 104  CO2 24 25 25   GLUCOSE 139* 144* 112*  BUN 5* 6* 10  CREATININE 1.37* 1.35* 1.50*  CALCIUM 8.7* 8.8* 8.8*   LFT Recent Labs    03/08/20 0308  PROT 6.8  ALBUMIN 2.8*  AST 26  ALT 14  ALKPHOS 78  BILITOT 0.9   PT/INR No results for input(s): LABPROT, INR in the last 72 hours. Hepatitis Panel No results for input(s): HEPBSAG, HCVAB, HEPAIGM, HEPBIGM in the last 72 hours. C-Diff No results for input(s): CDIFFTOX in the last 72 hours. Fecal Lactopherrin No results for input(s): FECLLACTOFRN in the last 72 hours.  Studies/Results: No results found.  Medications:  Scheduled: . dabigatran  150 mg Oral BID  . digoxin  125 mcg Oral Daily  . diltiazem  60 mg Oral Q8H  . DULoxetine  60 mg Oral Daily  . insulin aspart  0-15 Units Subcutaneous TID WC  . insulin glargine  40 Units Subcutaneous BID  . metoprolol tartrate  25 mg Oral BID  . pantoprazole  40 mg Oral BID  . potassium chloride  20 mEq Oral BID  . rosuvastatin  10 mg  Oral Daily   Continuous:   Assessment/Plan: 1) Exacerbation of gastroparesis. 2) Diarrhea - resolved. 3) Multiple medical issues.   Clinically she looks much better than when she presented.  She feels well at this time and she can now be safely discharged home.  Plan: 1) Okay for Gallagher/C to home. 2) Follow up in the office in two weeks.  LOS: 4 days   [name] 03/08/2020, 8:03 AM

## 2020-03-09 ENCOUNTER — Telehealth: Payer: Self-pay

## 2020-03-09 ENCOUNTER — Other Ambulatory Visit: Payer: Self-pay

## 2020-03-09 NOTE — Telephone Encounter (Addendum)
Transition Care Management Follow-up Telephone Call  Date of discharge and from where: 03/08/2020, [facility]   How have you been since you were released from the hospital? She said she is fine.   Any questions or concerns?  none at this time. She did mention that she needs to find a dentist because she has a cracked front tooth.   Items Reviewed:  Did the pt receive and understand the discharge instructions provided? yes  Medications obtained and verified?  she said she has all medications and does not have any questions about her med regime,  Any new allergies since your discharge? none reported   Do you have support at home?  yes, her daughter and grandchildren  Home health was ordered at discharge, [facility]  - PT/OT but she has not heard from them yet.   She has a cane and walker.   She needs a new glucometer, hers is not working   Functional Questionnaire: (I = Independent and D = Dependent) ADLs: independent  Follow up appointments reviewed:   PCP Hospital f/u appt confirmed? Juluis Mire, NP 03/24/2020, she did not want to schedule an appt any sooner  Specialist Hospital f/u appt confirmed?Cardiology - 03/31/2020  Are transportation arrangements needed? no  If their condition worsens, is the pt aware to call PCP or go to the Emergency Dept.?  yes   Was the patient provided with contact information for the PCP's office or ED?  she has the clinic phone number  Was to pt encouraged to call back with questions or concerns?  yes

## 2020-03-09 NOTE — Patient Outreach (Signed)
[place] [facility]) Care Management  03/09/2020  [name] 01-07-1956 [id number]     Transition of Care Referral  Referral Date: 03/08/2020 Referral Source: Hospital Liaison Date of Discharge: 03/08/2020 Facility: [place]    Outreach attempt # 1 to patient. Spoke with patient who reports she is doing fine since returning home yesterday.She confirms she has all her med and no issues regarding them. She has her follow up appts in place. Patient reports cbgs have been in the mid 100's. [facility] services reviewed and discussed with patient. Patient feels like she is able to manage and denied services at this time.    Plan: RN CM will close case at this time.   Enzo [place], RN,BSN,CCM [place] Management Telephonic Care Management Coordinator Direct Phone: [phone] Toll Free: [phone] Fax: [phone]

## 2020-03-10 DIAGNOSIS — G4733 Obstructive sleep apnea (adult) (pediatric): Secondary | ICD-10-CM | POA: Diagnosis not present

## 2020-03-12 DIAGNOSIS — N1832 Chronic kidney disease, stage 3b: Secondary | ICD-10-CM | POA: Diagnosis not present

## 2020-03-12 DIAGNOSIS — E1122 Type 2 diabetes mellitus with diabetic chronic kidney disease: Secondary | ICD-10-CM | POA: Diagnosis not present

## 2020-03-12 DIAGNOSIS — I5042 Chronic combined systolic (congestive) and diastolic (congestive) heart failure: Secondary | ICD-10-CM | POA: Diagnosis not present

## 2020-03-12 DIAGNOSIS — I480 Paroxysmal atrial fibrillation: Secondary | ICD-10-CM | POA: Diagnosis not present

## 2020-03-12 DIAGNOSIS — J449 Chronic obstructive pulmonary disease, unspecified: Secondary | ICD-10-CM | POA: Diagnosis not present

## 2020-03-12 DIAGNOSIS — K3184 Gastroparesis: Secondary | ICD-10-CM | POA: Diagnosis not present

## 2020-03-12 DIAGNOSIS — E1143 Type 2 diabetes mellitus with diabetic autonomic (poly)neuropathy: Secondary | ICD-10-CM | POA: Diagnosis not present

## 2020-03-12 DIAGNOSIS — I130 Hypertensive heart and chronic kidney disease with heart failure and stage 1 through stage 4 chronic kidney disease, or unspecified chronic kidney disease: Secondary | ICD-10-CM | POA: Diagnosis not present

## 2020-03-12 DIAGNOSIS — D631 Anemia in chronic kidney disease: Secondary | ICD-10-CM | POA: Diagnosis not present

## 2020-03-15 ENCOUNTER — Telehealth (INDEPENDENT_AMBULATORY_CARE_PROVIDER_SITE_OTHER): Payer: Self-pay | Admitting: Primary Care

## 2020-03-15 NOTE — Telephone Encounter (Signed)
Called to request verbal orders for home health PT - 2x wk 3, 1xwk 3.  CB# [phone]

## 2020-03-16 NOTE — Telephone Encounter (Signed)
Calling to check the status of the request for verbal orders for patient.  She stated that she still has not heard from the doctor.  CB# [phone]

## 2020-03-17 DIAGNOSIS — I480 Paroxysmal atrial fibrillation: Secondary | ICD-10-CM | POA: Diagnosis not present

## 2020-03-17 DIAGNOSIS — I130 Hypertensive heart and chronic kidney disease with heart failure and stage 1 through stage 4 chronic kidney disease, or unspecified chronic kidney disease: Secondary | ICD-10-CM | POA: Diagnosis not present

## 2020-03-17 DIAGNOSIS — E1143 Type 2 diabetes mellitus with diabetic autonomic (poly)neuropathy: Secondary | ICD-10-CM | POA: Diagnosis not present

## 2020-03-17 DIAGNOSIS — K3184 Gastroparesis: Secondary | ICD-10-CM | POA: Diagnosis not present

## 2020-03-17 DIAGNOSIS — N1832 Chronic kidney disease, stage 3b: Secondary | ICD-10-CM | POA: Diagnosis not present

## 2020-03-17 DIAGNOSIS — I5042 Chronic combined systolic (congestive) and diastolic (congestive) heart failure: Secondary | ICD-10-CM | POA: Diagnosis not present

## 2020-03-17 DIAGNOSIS — E1122 Type 2 diabetes mellitus with diabetic chronic kidney disease: Secondary | ICD-10-CM | POA: Diagnosis not present

## 2020-03-17 DIAGNOSIS — D631 Anemia in chronic kidney disease: Secondary | ICD-10-CM | POA: Diagnosis not present

## 2020-03-17 DIAGNOSIS — J449 Chronic obstructive pulmonary disease, unspecified: Secondary | ICD-10-CM | POA: Diagnosis not present

## 2020-03-18 DIAGNOSIS — N1832 Chronic kidney disease, stage 3b: Secondary | ICD-10-CM | POA: Diagnosis not present

## 2020-03-18 DIAGNOSIS — I480 Paroxysmal atrial fibrillation: Secondary | ICD-10-CM | POA: Diagnosis not present

## 2020-03-18 DIAGNOSIS — H524 Presbyopia: Secondary | ICD-10-CM | POA: Diagnosis not present

## 2020-03-18 DIAGNOSIS — D631 Anemia in chronic kidney disease: Secondary | ICD-10-CM | POA: Diagnosis not present

## 2020-03-18 DIAGNOSIS — K3184 Gastroparesis: Secondary | ICD-10-CM | POA: Diagnosis not present

## 2020-03-18 DIAGNOSIS — H52223 Regular astigmatism, bilateral: Secondary | ICD-10-CM | POA: Diagnosis not present

## 2020-03-18 DIAGNOSIS — E1143 Type 2 diabetes mellitus with diabetic autonomic (poly)neuropathy: Secondary | ICD-10-CM | POA: Diagnosis not present

## 2020-03-18 DIAGNOSIS — E1122 Type 2 diabetes mellitus with diabetic chronic kidney disease: Secondary | ICD-10-CM | POA: Diagnosis not present

## 2020-03-18 DIAGNOSIS — I130 Hypertensive heart and chronic kidney disease with heart failure and stage 1 through stage 4 chronic kidney disease, or unspecified chronic kidney disease: Secondary | ICD-10-CM | POA: Diagnosis not present

## 2020-03-18 DIAGNOSIS — J449 Chronic obstructive pulmonary disease, unspecified: Secondary | ICD-10-CM | POA: Diagnosis not present

## 2020-03-18 DIAGNOSIS — I5042 Chronic combined systolic (congestive) and diastolic (congestive) heart failure: Secondary | ICD-10-CM | POA: Diagnosis not present

## 2020-03-19 ENCOUNTER — Other Ambulatory Visit (INDEPENDENT_AMBULATORY_CARE_PROVIDER_SITE_OTHER): Payer: Self-pay | Admitting: Primary Care

## 2020-03-19 NOTE — Telephone Encounter (Signed)
Requested medication (s) are due for refill today - yes  Requested medication (s) are on the active medication list -yes  Future visit scheduled -yes  Last refill: 02/14/20  Notes to clinic: Request for RF for Rx prescribed by outside provider(hospital)  Requested Prescriptions  Pending Prescriptions Disp Refills   diltiazem (CARDIZEM) 60 MG tablet [Pharmacy Med Name: DILTIAZEM 60 MG TABLET] 90 tablet 3    Sig: Take 1 tablet (60 mg total) by mouth every 8 (eight) hours.      Cardiovascular:  Calcium Channel Blockers Passed - 03/19/2020 10:09 AM      Passed - Last BP in normal range    BP Readings from Last 1 Encounters:  03/08/20 127/74          Passed - Valid encounter within last 6 months    Recent Outpatient Visits           3 weeks ago Type 2 diabetes mellitus without complication, without long-term current use of insulin ([place])   [place] [facility] Juluis Mire P, NP   2 months ago Type 2 diabetes mellitus without complication, without long-term current use of insulin ([place])   [place] Scot Jun, FNP   5 months ago Hospital discharge follow-up   [place], [place], NP   5 months ago Hospital discharge follow-up   [place] [name], NP   6 months ago Encounter for diabetic foot exam ([place])   [place] [name], NP       Future Appointments             In 5 days [name], NP [place]   In 1 week Buford Dresser, MD [facility], [facility]                Requested Prescriptions  Pending Prescriptions Disp Refills   diltiazem (CARDIZEM) 60 MG tablet [Pharmacy Med Name: DILTIAZEM 60 MG TABLET] 90 tablet 3    Sig: Take 1 tablet (60 mg total) by mouth every 8 (eight) hours.      Cardiovascular:  Calcium Channel Blockers Passed - 03/19/2020 10:09 AM       Passed - Last BP in normal range    BP Readings from Last 1 Encounters:  03/08/20 127/74          Passed - Valid encounter within last 6 months    Recent Outpatient Visits           3 weeks ago Type 2 diabetes mellitus without complication, without long-term current use of insulin ([place])   [place] [facility] Juluis Mire P, NP   2 months ago Type 2 diabetes mellitus without complication, without long-term current use of insulin ([place])   [place] Scot Jun, FNP   5 months ago Hospital discharge follow-up   [place], [place], NP   5 months ago Hospital discharge follow-up   [place], [place], NP   6 months ago Encounter for diabetic foot exam [facility])   [place], [place], NP       Future Appointments             In 5 days Oletta Lamas, [place] Cage, NP [place]   In 1 week Buford Dresser, MD  [place] [facility], [facility]

## 2020-03-19 NOTE — Telephone Encounter (Signed)
pantoprazole (PROTONIX) 40 MG tablet    Patient requesting refill.    Pharmacy:  [id number] - [place], [place] Phone:  [phone]  Fax:  [phone]

## 2020-03-19 NOTE — Telephone Encounter (Signed)
Requested medication (s) are due for refill today: no  Requested medication (s) are on the active medication list: yes  Last refill:  03/02/2020  Future visit scheduled: yes   Notes to clinic: medication filled by a different provider Review for refill   Requested Prescriptions  Pending Prescriptions Disp Refills   pantoprazole (PROTONIX) 40 MG tablet 60 tablet 0    Sig: Take 1 tablet (40 mg total) by mouth 2 (two) times daily.      Gastroenterology: Proton Pump Inhibitors Passed - 03/19/2020 10:47 AM      Passed - Valid encounter within last 12 months    Recent Outpatient Visits           3 weeks ago Type 2 diabetes mellitus without complication, without long-term current use of insulin ([place])   [place] [facility] Juluis Mire P, NP   2 months ago Type 2 diabetes mellitus without complication, without long-term current use of insulin ([place])   [place] Scot Jun, FNP   5 months ago Hospital discharge follow-up   [place], [place], NP   5 months ago Hospital discharge follow-up   [place], [place], NP   6 months ago Encounter for diabetic foot exam [facility])   [place], [place], NP       Future Appointments             In 5 days Oletta Lamas, [place] Cage, NP [place]   In 1 week Buford Dresser, MD [place] [facility], [facility]

## 2020-03-22 DIAGNOSIS — I130 Hypertensive heart and chronic kidney disease with heart failure and stage 1 through stage 4 chronic kidney disease, or unspecified chronic kidney disease: Secondary | ICD-10-CM | POA: Diagnosis not present

## 2020-03-22 DIAGNOSIS — K3184 Gastroparesis: Secondary | ICD-10-CM | POA: Diagnosis not present

## 2020-03-22 DIAGNOSIS — E1143 Type 2 diabetes mellitus with diabetic autonomic (poly)neuropathy: Secondary | ICD-10-CM | POA: Diagnosis not present

## 2020-03-22 DIAGNOSIS — I5042 Chronic combined systolic (congestive) and diastolic (congestive) heart failure: Secondary | ICD-10-CM | POA: Diagnosis not present

## 2020-03-22 DIAGNOSIS — D631 Anemia in chronic kidney disease: Secondary | ICD-10-CM | POA: Diagnosis not present

## 2020-03-22 DIAGNOSIS — I480 Paroxysmal atrial fibrillation: Secondary | ICD-10-CM | POA: Diagnosis not present

## 2020-03-22 DIAGNOSIS — J449 Chronic obstructive pulmonary disease, unspecified: Secondary | ICD-10-CM | POA: Diagnosis not present

## 2020-03-22 DIAGNOSIS — E1122 Type 2 diabetes mellitus with diabetic chronic kidney disease: Secondary | ICD-10-CM | POA: Diagnosis not present

## 2020-03-22 DIAGNOSIS — N1832 Chronic kidney disease, stage 3b: Secondary | ICD-10-CM | POA: Diagnosis not present

## 2020-03-24 ENCOUNTER — Ambulatory Visit (INDEPENDENT_AMBULATORY_CARE_PROVIDER_SITE_OTHER): Payer: Medicare HMO | Admitting: Primary Care

## 2020-03-24 ENCOUNTER — Other Ambulatory Visit: Payer: Self-pay

## 2020-03-24 ENCOUNTER — Encounter (INDEPENDENT_AMBULATORY_CARE_PROVIDER_SITE_OTHER): Payer: Self-pay | Admitting: Primary Care

## 2020-03-24 VITALS — BP 137/79 | HR 98 | Temp 97.9°F | Ht 67.0 in | Wt 284.0 lb

## 2020-03-24 DIAGNOSIS — N1832 Chronic kidney disease, stage 3b: Secondary | ICD-10-CM | POA: Diagnosis not present

## 2020-03-24 DIAGNOSIS — I130 Hypertensive heart and chronic kidney disease with heart failure and stage 1 through stage 4 chronic kidney disease, or unspecified chronic kidney disease: Secondary | ICD-10-CM | POA: Diagnosis not present

## 2020-03-24 DIAGNOSIS — Z09 Encounter for follow-up examination after completed treatment for conditions other than malignant neoplasm: Secondary | ICD-10-CM | POA: Diagnosis not present

## 2020-03-24 DIAGNOSIS — D631 Anemia in chronic kidney disease: Secondary | ICD-10-CM | POA: Diagnosis not present

## 2020-03-24 DIAGNOSIS — I5042 Chronic combined systolic (congestive) and diastolic (congestive) heart failure: Secondary | ICD-10-CM | POA: Diagnosis not present

## 2020-03-24 DIAGNOSIS — K3184 Gastroparesis: Secondary | ICD-10-CM | POA: Diagnosis not present

## 2020-03-24 DIAGNOSIS — E1143 Type 2 diabetes mellitus with diabetic autonomic (poly)neuropathy: Secondary | ICD-10-CM | POA: Diagnosis not present

## 2020-03-24 DIAGNOSIS — E1169 Type 2 diabetes mellitus with other specified complication: Secondary | ICD-10-CM

## 2020-03-24 DIAGNOSIS — E669 Obesity, unspecified: Secondary | ICD-10-CM

## 2020-03-24 DIAGNOSIS — E1122 Type 2 diabetes mellitus with diabetic chronic kidney disease: Secondary | ICD-10-CM | POA: Diagnosis not present

## 2020-03-24 DIAGNOSIS — I480 Paroxysmal atrial fibrillation: Secondary | ICD-10-CM | POA: Diagnosis not present

## 2020-03-24 DIAGNOSIS — J449 Chronic obstructive pulmonary disease, unspecified: Secondary | ICD-10-CM | POA: Diagnosis not present

## 2020-03-24 MED ORDER — "INSULIN SYRINGE-NEEDLE U-100 31G X 5/16"" 0.3 ML MISC": 11 refills | Status: DC

## 2020-03-24 MED ORDER — TRULICITY 1.5 MG/0.5ML ~~LOC~~ SOAJ: 1.5000 mg | SUBCUTANEOUS | 2 refills | Status: DC

## 2020-03-24 MED ORDER — ONDANSETRON HCL 4 MG PO TABS: 4.0000 mg | ORAL_TABLET | Freq: Four times a day (QID) | ORAL | 1 refills | Status: DC | PRN

## 2020-03-24 MED ORDER — GABAPENTIN 300 MG PO CAPS: ORAL_CAPSULE | ORAL | 3 refills | Status: DC

## 2020-03-24 MED ORDER — PANTOPRAZOLE SODIUM 40 MG PO TBEC: 40.0000 mg | DELAYED_RELEASE_TABLET | Freq: Two times a day (BID) | ORAL | 1 refills | Status: DC

## 2020-03-24 MED ORDER — METOLAZONE 5 MG PO TABS: 5.0000 mg | ORAL_TABLET | Freq: Every day | ORAL | 0 refills | Status: DC

## 2020-03-24 MED ORDER — TRUE METRIX BLOOD GLUCOSE TEST VI STRP: ORAL_STRIP | 11 refills | Status: DC

## 2020-03-24 NOTE — Progress Notes (Signed)
Established Patient Office Visit  Subjective:  Patient ID: [name], female    DOB: 1956/03/09  Age: 64 y.o. MRN: [id number]  CC:  Chief Complaint  Patient presents with  . Diabetes    HPI [name] is a 64 year old obese female 64 y.o.female presents for follow up from the hospital. Admit date to the hospital was 03/02/20, patient was discharged from the hospital on 03/08/20, patient was admitted ERD:EYCXK gastroenteritis. Today she presents for the management type 2 diabetes.  She denies any signs or symptoms of hypo or hyper glycemia    Past Medical History:  Diagnosis Date  . Allergic rhinitis   . Arthritis   . Asthma   . Chest pain 12/27/2017  . Chronic diastolic CHF (congestive heart failure) ([place])   . COPD (chronic obstructive pulmonary disease) ([place])   . Depression   . DM (diabetes mellitus) ([place])   . DVT (deep vein thrombosis) in pregnancy   . Dysrhythmia    atrial fibrilation  . GERD (gastroesophageal reflux disease)   . Headache(784.0)   . HTN (hypertension)   . Hyperlipidemia   . Obesity   . Pneumonia 04/2018   RIGHT LOBE  . Shortness of breath   . Sleep apnea    compliant with CPAP    Past Surgical History:  Procedure Laterality Date  . ABDOMINAL HYSTERECTOMY    . CATARACT EXTRACTION, BILATERAL    . CHOLECYSTECTOMY    . COLONOSCOPY WITH PROPOFOL N/A 03/05/2015   Procedure: COLONOSCOPY WITH PROPOFOL;  Surgeon: Carol Ada, MD;  Location: [facility];  Service: Endoscopy;  Laterality: N/A;  . DIAGNOSTIC LAPAROSCOPY    . ingrown hallux Left   . KNEE SURGERY    . LEFT HEART CATH AND CORONARY ANGIOGRAPHY N/A 12/31/2017   Procedure: LEFT HEART CATH AND CORONARY ANGIOGRAPHY;  Surgeon: Charolette Forward, MD;  Location: [facility];  Service: Cardiovascular;  Laterality: N/A;    Family History  Problem Relation Age of Onset  . Cancer Mother   . Hypertension Mother   . Cancer Father   . CAD Other   . Hypertension Sister   .  Hypertension Brother     Social History   Socioeconomic History  . Marital status: Widowed    Spouse name: Not on file  . Number of children: Not on file  . Years of education: Not on file  . Highest education level: Not on file  Occupational History  . Not on file  Tobacco Use  . Smoking status: Never Smoker  . Smokeless tobacco: Never Used  Vaping Use  . Vaping Use: Never used  Substance and Sexual Activity  . Alcohol use: No  . Drug use: No  . Sexual activity: Not on file  Other Topics Concern  . Not on file  Social History Narrative   Pt lives in [place] with spouse.  2 grown children.   Previously worked in [employer] at [employer].  Now on disability   Social Determinants of Health   Financial Resource Strain:   . Difficulty of Paying Living Expenses:   Food Insecurity:   . Worried About Charity fundraiser in the Last Year:   . Arboriculturist in the Last Year:   Transportation Needs:   . Film/video editor (Medical):   Marland Kitchen Lack of Transportation (Non-Medical):   Physical Activity:   . Days of Exercise per Week:   . Minutes of Exercise per Session:   Stress:   .  Feeling of Stress :   Social Connections:   . Frequency of Communication with Friends and Family:   . Frequency of Social Gatherings with Friends and Family:   . Attends Religious Services:   . Active Member of Clubs or Organizations:   . Attends Archivist Meetings:   Marland Kitchen Marital Status:   Intimate Partner Violence:   . Fear of Current or Ex-Partner:   . Emotionally Abused:   Marland Kitchen Physically Abused:   . Sexually Abused:     Outpatient Medications Prior to Visit  Medication Sig Dispense Refill  . Accu-Chek FastClix Lancets MISC Use as directed three times daily 102 each 12  . albuterol (VENTOLIN HFA) 108 (90 Base) MCG/ACT inhaler Inhale 2 puffs into the lungs every 4 (four) hours as needed for wheezing or shortness of breath.     . Alcohol Swabs (B-D SINGLE USE SWABS REGULAR) PADS      . Blood Glucose Monitoring Suppl (TRUE METRIX METER) w/Device KIT Use to check blood sugar daily. 1 kit 0  . dabigatran (PRADAXA) 150 MG CAPS capsule Take 1 capsule (150 mg total) by mouth 2 (two) times daily. 60 capsule 2  . digoxin (LANOXIN) 0.125 MG tablet Take 125 mcg by mouth daily.    Marland Kitchen diltiazem (CARDIZEM) 60 MG tablet TAKE 1 TABLET (60 MG TOTAL) BY MOUTH EVERY 8 (EIGHT) HOURS. 90 tablet 3  . DULoxetine (CYMBALTA) 60 MG capsule Take 1 capsule (60 mg total) by mouth daily. 30 capsule 3  . LEVEMIR 100 UNIT/ML injection INJECT 0.4 MLS (40 UNITS TOTAL) INTO THE SKIN 2 (TWO) TIMES DAILY. (Patient taking differently: Inject 50 Units into the skin 2 (two) times daily. ) 60 mL 1  . losartan (COZAAR) 25 MG tablet Take 25 mg by mouth daily.    . metoprolol tartrate (LOPRESSOR) 25 MG tablet Take 1 tablet (25 mg total) by mouth 2 (two) times daily. (Patient taking differently: Take 25 mg by mouth in the morning, at noon, and at bedtime. ) 60 tablet 0  . Multiple Vitamins-Minerals (MULTIVITAMIN WOMEN PO) Take 1 tablet by mouth daily.     . nitroGLYCERIN (NITROSTAT) 0.4 MG SL tablet Place 1 tablet (0.4 mg total) under the tongue every 5 (five) minutes x 3 doses as needed for chest pain. 25 tablet 12  . NOVOLOG 100 UNIT/ML injection INJECT 8 UNITS INTO THE SKIN 3 (THREE) TIMES DAILY WITH MEALS. TAKE ONLY WHEN YOUR EAT MORE THAN 50% OF YOUR MEALS (Patient taking differently: Inject 5 Units into the skin 3 (three) times daily with meals. ) 30 mL 0  . potassium chloride SA (KLOR-CON) 20 MEQ tablet Take 20 mEq by mouth daily.    . RESTASIS 0.05 % ophthalmic emulsion Place 1 drop into both eyes 2 (two) times daily.  12  . rosuvastatin (CRESTOR) 10 MG tablet Take 10 mg by mouth daily.    . sucralfate (CARAFATE) 1 g tablet Take 1 tablet (1 g total) by mouth 4 (four) times daily as needed. (Patient taking differently: Take 1 g by mouth 4 (four) times daily as needed (stomach protection). ) 30 tablet 0  . traMADol  (ULTRAM) 50 MG tablet Take 1 tablet (50 mg total) by mouth every 8 (eight) hours as needed. (Patient taking differently: Take 50 mg by mouth every 8 (eight) hours as needed (pain). ) 60 tablet 0  . Dulaglutide (TRULICITY) 1.5 XK/4.8JE SOPN Inject 1.5 mg into the skin once a week. (Patient taking differently: Inject 1.5 mg  into the skin every Wednesday. ) 12 pen 2  . glucose blood (TRUE METRIX BLOOD GLUCOSE TEST) test strip Use as instructed to check blood sugar daily. 100 each 11  . Insulin Syringe-Needle U-100 (TRUEPLUS INSULIN SYRINGE) 31G X 5/16" 0.3 ML MISC Use to administer insulin every day. 100 each 11  . metolazone (ZAROXOLYN) 5 MG tablet Take 5 mg by mouth daily.    . ondansetron (ZOFRAN) 4 MG tablet Take 1 tablet (4 mg total) by mouth every 6 (six) hours as needed for nausea. 20 tablet 0  . pantoprazole (PROTONIX) 40 MG tablet Take 1 tablet (40 mg total) by mouth 2 (two) times daily. 60 tablet 0  . furosemide (LASIX) 40 MG tablet Take 1 tablet (40 mg total) by mouth daily. (Patient taking differently: Take 40 mg by mouth 2 (two) times daily. ) 60 tablet 11   No facility-administered medications prior to visit.    Allergies  Allergen Reactions  . Sulfa Antibiotics Itching    ROS Review of Systems  Gastrointestinal: Positive for abdominal pain and nausea.  All other systems reviewed and are negative.     Objective:    Physical Exam Vitals reviewed.  Constitutional:      Appearance: She is obese.     Comments: morbid  HENT:     Right Ear: Tympanic membrane normal.     Left Ear: Tympanic membrane normal.     Nose: Nose normal.  Cardiovascular:     Rate and Rhythm: Normal rate and regular rhythm.     Pulses: Normal pulses.     Heart sounds: Normal heart sounds.  Pulmonary:     Effort: Pulmonary effort is normal.     Breath sounds: Normal breath sounds.  Abdominal:     General: Bowel sounds are normal. There is distension.  Musculoskeletal:        General: Normal  range of motion.     Cervical back: Normal range of motion and neck supple.  Skin:    General: Skin is warm.  Neurological:     Mental Status: She is alert and oriented to person, place, and time.  Psychiatric:        Mood and Affect: Mood normal.        Behavior: Behavior normal.        Thought Content: Thought content normal.        Judgment: Judgment normal.     BP 137/79 (BP Location: Left Arm, Patient Position: Sitting, Cuff Size: Large)   Pulse 98   Temp 97.9 F (36.6 C) (Oral)   Ht '5\' 7"'$  (1.702 m)   Wt 284 lb (128.8 kg)   SpO2 96%   BMI 44.48 kg/m  Wt Readings from Last 3 Encounters:  03/24/20 284 lb (128.8 kg)  03/08/20 274 lb 14.6 oz (124.7 kg)  02/24/20 270 lb (122.5 kg)     Health Maintenance Due  Topic Date Due  . OPHTHALMOLOGY EXAM  Never done  . PAP SMEAR-Modifier  Never done  . MAMMOGRAM  Never done  . INFLUENZA VACCINE  03/21/2020    There are no preventive care reminders to display for this patient.  Lab Results  Component Value Date   TSH 2.820 12/23/2019   Lab Results  Component Value Date   WBC 10.6 (H) 03/08/2020   HGB 11.6 (L) 03/08/2020   HCT 34.6 (L) 03/08/2020   MCV 90.1 03/08/2020   PLT 259 03/08/2020   Lab Results  Component Value Date  NA 135 03/08/2020   K 5.2 (H) 03/08/2020   CO2 25 03/08/2020   GLUCOSE 112 (H) 03/08/2020   BUN 10 03/08/2020   CREATININE 1.50 (H) 03/08/2020   BILITOT 0.9 03/08/2020   ALKPHOS 78 03/08/2020   AST 26 03/08/2020   ALT 14 03/08/2020   PROT 6.8 03/08/2020   ALBUMIN 2.8 (L) 03/08/2020   CALCIUM 8.8 (L) 03/08/2020   ANIONGAP 6 03/08/2020   Lab Results  Component Value Date   CHOL 234 (H) 09/04/2019   Lab Results  Component Value Date   HDL 47 09/04/2019   Lab Results  Component Value Date   LDLCALC 161 (H) 09/04/2019   Lab Results  Component Value Date   TRIG 143 09/04/2019   Lab Results  Component Value Date   CHOLHDL 5.0 (H) 09/04/2019    Assessment & Plan:  [name]  was seen today for diabetes.  Diagnoses and all orders for this visit:  Hospital discharge follow-up Retrieved from hospital discharge on 03/08/2020 Recommendations for Outpatient Follow-up:  1. Follow up with PCP in 1-2 weeks 2. Follow up with [name] within 1-2 weeks 3. Follow up with Cardiology Dr. Terrence Dupont within 1-2 weeks 4. Please obtain CMP/CBC, Mag, Phos in one week 5. Please follow up on the following pending results:  Diabetes mellitus type 2 in obese [facility]) : Goal of therapy: Less than 6.5 hemoglobin A1c.Remember to to decrease foods that are high in carbohydrates are the following rice, potatoes, breads, sugars, and pastas.  Reduction in the intake (eating) will assist in lowering your blood sugars. -     Insulin Syringe-Needle U-100 (TRUEPLUS INSULIN SYRINGE) 31G X 5/16" 0.3 ML MISC; Use to administer insulin every day. -     glucose blood (TRUE METRIX BLOOD GLUCOSE TEST) test strip; Use as instructed to check blood sugar daily.  Other orders -     ondansetron (ZOFRAN) 4 MG tablet; Take 1 tablet (4 mg total) by mouth every 6 (six) hours as needed for nausea. -     gabapentin (NEURONTIN) 300 MG capsule; Take 2 capsules in the morning and at bedtime -     metolazone (ZAROXOLYN) 5 MG tablet; Take 1 tablet (5 mg total) by mouth daily. -     pantoprazole (PROTONIX) 40 MG tablet; Take 1 tablet (40 mg total) by mouth 2 (two) times daily. -     Dulaglutide (TRULICITY) 1.5 OV/7.8HY SOPN; Inject 0.5 mLs (1.5 mg total) into the skin once a week.    Meds ordered this encounter  Medications  . ondansetron (ZOFRAN) 4 MG tablet    Sig: Take 1 tablet (4 mg total) by mouth every 6 (six) hours as needed for nausea.    Dispense:  30 tablet    Refill:  1  . gabapentin (NEURONTIN) 300 MG capsule    Sig: Take 2 capsules in the morning and at bedtime    Dispense:  120 capsule    Refill:  3  . metolazone (ZAROXOLYN) 5 MG tablet    Sig: Take 1 tablet (5 mg total) by mouth  daily.    Dispense:  30 tablet    Refill:  0  . pantoprazole (PROTONIX) 40 MG tablet    Sig: Take 1 tablet (40 mg total) by mouth 2 (two) times daily.    Dispense:  180 tablet    Refill:  1  . Insulin Syringe-Needle U-100 (TRUEPLUS INSULIN SYRINGE) 31G X 5/16" 0.3 ML MISC    Sig: Use to  administer insulin every day.    Dispense:  100 each    Refill:  11  . glucose blood (TRUE METRIX BLOOD GLUCOSE TEST) test strip    Sig: Use as instructed to check blood sugar daily.    Dispense:  100 each    Refill:  11  . Dulaglutide (TRULICITY) 1.5 AX/6.5VV SOPN    Sig: Inject 0.5 mLs (1.5 mg total) into the skin once a week.    Dispense:  12 pen    Refill:  2    Follow-up: Return in about 3 months (around 06/24/2020) for in person DM/ 2weeks Lurena Joiner.    [name], NP

## 2020-03-24 NOTE — Patient Instructions (Signed)
Diabetes Mellitus and Nutrition, Adult When you have diabetes (diabetes mellitus), it is very important to have healthy eating habits because your blood sugar (glucose) levels are greatly affected by what you eat and drink. Eating healthy foods in the appropriate amounts, at about the same times every day, can help you:  Control your blood glucose.  Lower your risk of heart disease.  Improve your blood pressure.  Reach or maintain a healthy weight. Every person with diabetes is different, and each person has different needs for a meal plan. Your health care provider may recommend that you work with a diet and nutrition specialist (dietitian) to make a meal plan that is best for you. Your meal plan may vary depending on factors such as:  The calories you need.  The medicines you take.  Your weight.  Your blood glucose, blood pressure, and cholesterol levels.  Your activity level.  Other health conditions you have, such as heart or kidney disease. How do carbohydrates affect me? Carbohydrates, also called carbs, affect your blood glucose level more than any other type of food. Eating carbs naturally raises the amount of glucose in your blood. Carb counting is a method for keeping track of how many carbs you eat. Counting carbs is important to keep your blood glucose at a healthy level, especially if you use insulin or take certain oral diabetes medicines. It is important to know how many carbs you can safely have in each meal. This is different for every person. Your dietitian can help you calculate how many carbs you should have at each meal and for each snack. Foods that contain carbs include:  Bread, cereal, rice, pasta, and crackers.  Potatoes and corn.  Peas, beans, and lentils.  Milk and yogurt.  Fruit and juice.  Desserts, such as cakes, cookies, ice cream, and candy. How does alcohol affect me? Alcohol can cause a sudden decrease in blood glucose (hypoglycemia),  especially if you use insulin or take certain oral diabetes medicines. Hypoglycemia can be a life-threatening condition. Symptoms of hypoglycemia (sleepiness, dizziness, and confusion) are similar to symptoms of having too much alcohol. If your health care provider says that alcohol is safe for you, follow these guidelines:  Limit alcohol intake to no more than 1 drink per day for nonpregnant women and 2 drinks per day for men. One drink equals 12 oz of beer, 5 oz of wine, or 1 oz of hard liquor.  Do not drink on an empty stomach.  Keep yourself hydrated with water, diet soda, or unsweetened iced tea.  Keep in mind that regular soda, juice, and other mixers may contain a lot of sugar and must be counted as carbs. What are tips for following this plan?  Reading food labels  Start by checking the serving size on the "Nutrition Facts" label of packaged foods and drinks. The amount of calories, carbs, fats, and other nutrients listed on the label is based on one serving of the item. Many items contain more than one serving per package.  Check the total grams (g) of carbs in one serving. You can calculate the number of servings of carbs in one serving by dividing the total carbs by 15. For example, if a food has 30 g of total carbs, it would be equal to 2 servings of carbs.  Check the number of grams (g) of saturated and trans fats in one serving. Choose foods that have low or no amount of these fats.  Check the number of   milligrams (mg) of salt (sodium) in one serving. Most people should limit total sodium intake to less than 2,300 mg per day.  Always check the nutrition information of foods labeled as "low-fat" or "nonfat". These foods may be higher in added sugar or refined carbs and should be avoided.  Talk to your dietitian to identify your daily goals for nutrients listed on the label. Shopping  Avoid buying canned, premade, or processed foods. These foods tend to be high in fat, sodium,  and added sugar.  Shop around the outside edge of the grocery store. This includes fresh fruits and vegetables, bulk grains, fresh meats, and fresh dairy. Cooking  Use low-heat cooking methods, such as baking, instead of high-heat cooking methods like deep frying.  Cook using healthy oils, such as olive, canola, or sunflower oil.  Avoid cooking with butter, cream, or high-fat meats. Meal planning  Eat meals and snacks regularly, preferably at the same times every day. Avoid going long periods of time without eating.  Eat foods high in fiber, such as fresh fruits, vegetables, beans, and whole grains. Talk to your dietitian about how many servings of carbs you can eat at each meal.  Eat 4-6 ounces (oz) of lean protein each day, such as lean meat, chicken, fish, eggs, or tofu. One oz of lean protein is equal to: ? 1 oz of meat, chicken, or fish. ? 1 egg. ?  cup of tofu.  Eat some foods each day that contain healthy fats, such as avocado, nuts, seeds, and fish. Lifestyle  Check your blood glucose regularly.  Exercise regularly as told by your health care provider. This may include: ? 150 minutes of moderate-intensity or vigorous-intensity exercise each week. This could be brisk walking, biking, or water aerobics. ? Stretching and doing strength exercises, such as yoga or weightlifting, at least 2 times a week.  Take medicines as told by your health care provider.  Do not use any products that contain nicotine or tobacco, such as cigarettes and e-cigarettes. If you need help quitting, ask your health care provider.  Work with a counselor or diabetes educator to identify strategies to manage stress and any emotional and social challenges. Questions to ask a health care provider  Do I need to meet with a diabetes educator?  Do I need to meet with a dietitian?  What number can I call if I have questions?  When are the best times to check my blood glucose? Where to find more  information:  American Diabetes Association: diabetes.org  Academy of Nutrition and Dietetics: www.eatright.org  National Institute of Diabetes and Digestive and Kidney Diseases (NIH): www.niddk.nih.gov Summary  A healthy meal plan will help you control your blood glucose and maintain a healthy lifestyle.  Working with a diet and nutrition specialist (dietitian) can help you make a meal plan that is best for you.  Keep in mind that carbohydrates (carbs) and alcohol have immediate effects on your blood glucose levels. It is important to count carbs and to use alcohol carefully. This information is not intended to replace advice given to you by your health care provider. Make sure you discuss any questions you have with your health care provider. Document Revised: 07/20/2017 Document Reviewed: 09/11/2016 Elsevier Patient Education  2020 Elsevier Inc.  

## 2020-03-25 ENCOUNTER — Telehealth: Payer: Self-pay | Admitting: Primary Care

## 2020-03-25 DIAGNOSIS — S82891A Other fracture of right lower leg, initial encounter for closed fracture: Secondary | ICD-10-CM | POA: Diagnosis not present

## 2020-03-25 MED ORDER — INSULIN DETEMIR 100 UNIT/ML ~~LOC~~ SOLN: SUBCUTANEOUS | 1 refills | Status: DC

## 2020-03-25 MED ORDER — ROSUVASTATIN CALCIUM 10 MG PO TABS: 10.0000 mg | ORAL_TABLET | Freq: Every day | ORAL | 1 refills | Status: DC

## 2020-03-25 MED ORDER — INSULIN ASPART 100 UNIT/ML ~~LOC~~ SOLN: 5.0000 [IU] | Freq: Three times a day (TID) | SUBCUTANEOUS | 3 refills | Status: DC

## 2020-03-25 NOTE — Telephone Encounter (Signed)
Copied from [place] [phone]. Topic: Quick Communication - Rx Refill/Question >> Mar 25, 2020 11:15 AM Erick Blinks wrote: Medication: Patient called reporting that she went to the pharmacy and they stated that they didn't have any meds for her. We have receipts confirming that a list of medications was received yesterday. Please advise, clinician may need to call and resolve this.   Has the patient contacted their pharmacy? Yes.   (Agent: If no, request that the patient contact the pharmacy for the refill.) (Agent: If yes, when and what did the pharmacy advise?)  Preferred Pharmacy (with phone number or street name): [id number] - [place], [place] - [address] [place] [address] Phone: [phone] Fax: [phone]    Agent: Please be advised that RX refills may take up to 3 business days. We ask that you follow-up with your pharmacy.

## 2020-03-26 NOTE — Telephone Encounter (Signed)
Advised patient to contact pharmacy again. And ask them to get prescriptions ready for pick up. As she stated she was told they didn't have anything ready for her.

## 2020-03-30 DIAGNOSIS — I5042 Chronic combined systolic (congestive) and diastolic (congestive) heart failure: Secondary | ICD-10-CM | POA: Diagnosis not present

## 2020-03-30 DIAGNOSIS — J449 Chronic obstructive pulmonary disease, unspecified: Secondary | ICD-10-CM | POA: Diagnosis not present

## 2020-03-30 DIAGNOSIS — D631 Anemia in chronic kidney disease: Secondary | ICD-10-CM | POA: Diagnosis not present

## 2020-03-30 DIAGNOSIS — I130 Hypertensive heart and chronic kidney disease with heart failure and stage 1 through stage 4 chronic kidney disease, or unspecified chronic kidney disease: Secondary | ICD-10-CM | POA: Diagnosis not present

## 2020-03-30 DIAGNOSIS — I480 Paroxysmal atrial fibrillation: Secondary | ICD-10-CM | POA: Diagnosis not present

## 2020-03-30 DIAGNOSIS — K3184 Gastroparesis: Secondary | ICD-10-CM | POA: Diagnosis not present

## 2020-03-30 DIAGNOSIS — N1832 Chronic kidney disease, stage 3b: Secondary | ICD-10-CM | POA: Diagnosis not present

## 2020-03-30 DIAGNOSIS — E1143 Type 2 diabetes mellitus with diabetic autonomic (poly)neuropathy: Secondary | ICD-10-CM | POA: Diagnosis not present

## 2020-03-30 DIAGNOSIS — E1122 Type 2 diabetes mellitus with diabetic chronic kidney disease: Secondary | ICD-10-CM | POA: Diagnosis not present

## 2020-03-31 ENCOUNTER — Other Ambulatory Visit: Payer: Self-pay

## 2020-03-31 ENCOUNTER — Ambulatory Visit (INDEPENDENT_AMBULATORY_CARE_PROVIDER_SITE_OTHER): Payer: Medicare HMO | Admitting: Cardiology

## 2020-03-31 ENCOUNTER — Encounter: Payer: Self-pay | Admitting: Cardiology

## 2020-03-31 VITALS — BP 138/90 | HR 105 | Ht 67.0 in | Wt 285.8 lb

## 2020-03-31 DIAGNOSIS — I480 Paroxysmal atrial fibrillation: Secondary | ICD-10-CM | POA: Diagnosis not present

## 2020-03-31 DIAGNOSIS — Z8249 Family history of ischemic heart disease and other diseases of the circulatory system: Secondary | ICD-10-CM

## 2020-03-31 DIAGNOSIS — I071 Rheumatic tricuspid insufficiency: Secondary | ICD-10-CM

## 2020-03-31 DIAGNOSIS — Z7189 Other specified counseling: Secondary | ICD-10-CM

## 2020-03-31 DIAGNOSIS — I340 Nonrheumatic mitral (valve) insufficiency: Secondary | ICD-10-CM

## 2020-03-31 DIAGNOSIS — E669 Obesity, unspecified: Secondary | ICD-10-CM

## 2020-03-31 DIAGNOSIS — I428 Other cardiomyopathies: Secondary | ICD-10-CM | POA: Diagnosis not present

## 2020-03-31 DIAGNOSIS — E1169 Type 2 diabetes mellitus with other specified complication: Secondary | ICD-10-CM | POA: Diagnosis not present

## 2020-03-31 DIAGNOSIS — I5041 Acute combined systolic (congestive) and diastolic (congestive) heart failure: Secondary | ICD-10-CM

## 2020-03-31 DIAGNOSIS — Z79899 Other long term (current) drug therapy: Secondary | ICD-10-CM

## 2020-03-31 MED ORDER — METOPROLOL SUCCINATE ER 100 MG PO TB24
100.0000 mg | ORAL_TABLET | Freq: Every day | ORAL | 3 refills | Status: DC
Start: 2020-03-31 — End: 2020-09-08

## 2020-03-31 MED ORDER — FUROSEMIDE 40 MG PO TABS: 40.0000 mg | ORAL_TABLET | Freq: Two times a day (BID) | ORAL | 3 refills | Status: DC

## 2020-03-31 MED ORDER — METOLAZONE 5 MG PO TABS: 5.0000 mg | ORAL_TABLET | Freq: Every day | ORAL | 3 refills | Status: DC

## 2020-03-31 NOTE — Progress Notes (Signed)
Cardiology Office Note:    Date:  03/31/2020   ID:  [name], DOB July 17, 1956, MRN [id number]  PCP:  [name], NP  Cardiologist:  Buford Dresser, MD  Referring MD: [name], NP   Chief Complaint  Patient presents with  . Edema    bilateral ankles    History of Present Illness:    [name] is a 64 y.o. female with a hx of atrial fibrillation, chronic systolic and diastolic heart failure, OSA, type II diabetes who is seen as a new consult at the request of [name], NP for the evaluation and management of atrial fibrillation, heart failure.  Notes reviewed in chart, including discharge summary dated 03/08/20. Note from recent visit with Juluis Mire 03/24/20 also reviewed. She has previously been seen by Dr. Terrence Dupont, but these notes are not available today.  Today: Patient concerns: wants to establish with new provider for care of her cardiac conditions. She can tell when her afib kicks in, makes her feel poorly. Also knows when she starts swelling.   Feels shaky/woozy since this morning, thinks this is her afib. Feeling nauseated. Notes that there are two medications she is out of. We did extensive medication reconciliation today. She is out of metolazone 5 mg daily, pantoprazole (this has just been sent in by PCP). Has been out of these medications for several weeks. Has had more ankle swelling, up about 15 lbs from her normal weight. Has felt more short of breath with exertion.   Family history: -brother has heart failure, age 96. Has an ICD, has had heart failure for a long time. -sister had heart failure vs. MI, passed away around age 58-40. Did not have heart failure long before she passed. No other medical problems -other sister (Pepper) has HF. Currently 87 years old.  -granddaughter has heart failure. 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  Unknown cause. Currently on no medications at age 52. -mother had MI, passed away from cancer.   Has been told there is something wrong with one of her valves. Noted on prior echo to have severe MR. LA severely dilated. Also has severe TR.  Denies any low blood pressures or heart rates. She does not have a blood pressure cuff at home but has not been told she has low blood pressures.  Denies chest pain, shortness of breath at rest. No PND, orthopnea. No syncope.   Past Medical History:  Diagnosis Date  . Allergic rhinitis   . Arthritis   . Asthma   . Chest pain 12/27/2017  . Chronic diastolic CHF (congestive heart failure) ([place])   . COPD (chronic obstructive pulmonary disease) ([place])   . Depression   . DM (diabetes mellitus) ([place])   . DVT (deep vein thrombosis) in pregnancy   . Dysrhythmia    atrial fibrilation  . GERD (gastroesophageal reflux disease)   . Headache(784.0)   . HTN (hypertension)   . Hyperlipidemia   . Obesity   . Pneumonia 04/2018   RIGHT LOBE  . Shortness of breath   . Sleep apnea    compliant with CPAP    Past Surgical History:  Procedure Laterality Date  . ABDOMINAL HYSTERECTOMY    . CATARACT EXTRACTION, BILATERAL    . CHOLECYSTECTOMY    . COLONOSCOPY WITH PROPOFOL N/A 03/05/2015   Procedure: COLONOSCOPY WITH PROPOFOL;  Surgeon: Carol Ada, MD;  Location: [facility];  Service: Endoscopy;  Laterality: N/A;  . DIAGNOSTIC LAPAROSCOPY    . ingrown hallux Left   . KNEE SURGERY    . LEFT HEART CATH AND CORONARY ANGIOGRAPHY N/A 12/31/2017   Procedure: LEFT HEART CATH AND CORONARY ANGIOGRAPHY;  Surgeon: Charolette Forward, MD;  Location: [facility];  Service: Cardiovascular;  Laterality: N/A;    Current Medications: Current Outpatient Medications on File Prior to Visit  Medication Sig  . Accu-Chek FastClix Lancets MISC Use as directed three times daily  . albuterol (VENTOLIN HFA) 108 (90 Base) MCG/ACT inhaler Inhale 2 puffs into the lungs every 4  (four) hours as needed for wheezing or shortness of breath.   . Alcohol Swabs (B-D SINGLE USE SWABS REGULAR) PADS   . Blood Glucose Monitoring Suppl (TRUE METRIX METER) w/Device KIT Use to check blood sugar daily.  . dabigatran (PRADAXA) 150 MG CAPS capsule Take 1 capsule (150 mg total) by mouth 2 (two) times daily.  . digoxin (LANOXIN) 0.125 MG tablet Take 125 mcg by mouth daily.  Marland Kitchen diltiazem (CARDIZEM) 60 MG tablet TAKE 1 TABLET (60 MG TOTAL) BY MOUTH EVERY 8 (EIGHT) HOURS.  . Dulaglutide (TRULICITY) 1.5 PJ/0.9TO SOPN Inject 0.5 mLs (1.5 mg total) into the skin once a week.  . DULoxetine (CYMBALTA) 60 MG capsule Take 1 capsule (60 mg total) by mouth daily.  Marland Kitchen gabapentin (NEURONTIN) 300 MG capsule Take 2 capsules in the morning and at bedtime  . glucose blood (TRUE METRIX BLOOD GLUCOSE TEST) test strip Use as instructed to check blood sugar daily.  . insulin aspart (NOVOLOG) 100 UNIT/ML injection Inject 5 Units into the skin 3 (three) times daily with meals.  . insulin detemir (LEVEMIR) 100 UNIT/ML injection INJECT 0.4 MLS (40 UNITS TOTAL) INTO THE SKIN 2 (TWO) TIMES DAILY.  Marland Kitchen Insulin Syringe-Needle U-100 (TRUEPLUS INSULIN SYRINGE) 31G X 5/16" 0.3 ML MISC Use to administer insulin every day.  . losartan (COZAAR) 25 MG tablet Take 25 mg by mouth daily.  . metolazone (ZAROXOLYN) 5 MG tablet Take 1 tablet (5 mg total) by mouth daily.  . metoprolol tartrate (LOPRESSOR) 25 MG tablet Take 1 tablet (25 mg total) by mouth 2 (two) times daily. (Patient taking differently: Take 25 mg by mouth in the morning, at noon, and at bedtime. )  . Multiple Vitamins-Minerals (MULTIVITAMIN WOMEN PO) Take 1 tablet by mouth daily.   . nitroGLYCERIN (NITROSTAT) 0.4 MG SL tablet Place 1 tablet (0.4 mg total) under the tongue every 5 (five) minutes x 3 doses as needed for chest pain.  Marland Kitchen ondansetron (ZOFRAN) 4 MG tablet Take 1 tablet (4 mg total) by mouth every 6 (six) hours as needed for nausea.  . pantoprazole (PROTONIX)  40 MG tablet Take 1 tablet (40 mg total) by mouth 2 (two) times daily.  . RESTASIS 0.05 % ophthalmic emulsion Place 1 drop into both eyes 2 (two) times daily.  . rosuvastatin (CRESTOR) 10 MG tablet Take 1 tablet (10 mg total) by mouth daily.  . traMADol (ULTRAM) 50 MG tablet Take 1 tablet (50 mg total) by mouth every 8 (eight) hours as needed. (Patient taking differently: Take 50 mg by mouth every 8 (eight) hours as needed (pain). )  . furosemide (LASIX) 40 MG tablet Take 1 tablet (40 mg total) by mouth daily. (Patient taking differently: Take 40 mg by mouth 2 (two) times daily. )   No current facility-administered medications on file prior to visit.     Allergies:   Sulfa antibiotics  Social History   Tobacco Use  . Smoking status: Never Smoker  . Smokeless tobacco: Never Used  Vaping Use  . Vaping Use: Never used  Substance Use Topics  . Alcohol use: No  . Drug use: No    Family History: family history includes CAD in an other family member; Cancer in her father and mother; Hypertension in her brother, mother, and sister.  ROS:   Please see the history of present illness.  Additional pertinent ROS: Constitutional: Negative for chills, fever, night sweats, unintentional weight loss  HENT: Negative for ear pain and hearing loss.   Eyes: Negative for loss of vision and eye pain.  Respiratory: Negative for cough, sputum, wheezing.   Cardiovascular: See HPI. Gastrointestinal: Negative for abdominal pain, melena, and hematochezia.  Genitourinary: Negative for dysuria and hematuria.  Musculoskeletal: Negative for falls and myalgias.  Skin: Negative for itching and rash.  Neurological: Negative for focal weakness, focal sensory changes and loss of consciousness.  Endo/Heme/Allergies: Does not bruise/bleed easily.    EKGs/Labs/Other Studies Reviewed:    The following studies were reviewed today: Echo 07/02/2018 - Left ventricle: The cavity size was normal. Systolic function was    mildly reduced. The estimated ejection fraction was in the range  of 45% to 50%. There is moderate hypokinesis of the  entireanteroseptal myocardium.  - Aortic valve: There was mild regurgitation.  - Mitral valve: Mildly dilated, calcified annulus. There was severe  regurgitation.  - Left atrium: The atrium was severely dilated.  - Right ventricle: The cavity size was mildly dilated. Wall  thickness was normal. Systolic function was mildly to moderately  reduced.  - Right atrium: The atrium was severely dilated.  - Tricuspid valve: There was severe regurgitation.  - Pulmonary arteries: Systolic pressure was mildly increased. PA  peak pressure: 31 mm Hg (S).   Cath 12/31/2017  Prox LAD lesion is 10% stenosed.  Dist LM to Ost LAD lesion is 10% stenosed.  Ost Cx lesion is 15% stenosed.  The left ventricular systolic function is normal.  LV end diastolic pressure is normal.   EKG:  EKG is personally reviewed.  The ekg ordered today demonstrates atrial fibrillation with RVR, rate 105 bpm  Recent Labs: 12/23/2019: TSH 2.820 03/02/2020: B Natriuretic Peptide 43.7 03/08/2020: ALT 14; BUN 10; Creatinine, Ser 1.50; Hemoglobin 11.6; Magnesium 1.8; Platelets 259; Potassium 5.2; Sodium 135  Recent Lipid Panel    Component Value Date/Time   CHOL 234 (H) 09/04/2019 1031   TRIG 143 09/04/2019 1031   HDL 47 09/04/2019 1031   CHOLHDL 5.0 (H) 09/04/2019 1031   CHOLHDL 3.1 12/30/2017 0436   VLDL 20 12/30/2017 0436   LDLCALC 161 (H) 09/04/2019 1031    Physical Exam:    VS:  BP 138/90 (BP Location: Left Arm, Patient Position: Sitting, Cuff Size: Large)   Pulse (!) 105   Ht _0  (1.702 m)   Wt 285 lb 12.8 oz (129.6 kg)   BMI 44.76 kg/m     Wt Readings from Last 3 Encounters:  03/31/20 285 lb 12.8 oz (129.6 kg)  03/24/20 284 lb (128.8 kg)  03/08/20 274 lb 14.6 oz (124.7 kg)    GEN: Well nourished, well developed in no acute distress HEENT: Normal, moist mucous  membranes NECK: JVD elevated above clavicle at 90 degrees with prominent TR pulsation CARDIAC: regular rhythm, normal S1 and S2, no rubs or gallops. 2/6 HSM. VASCULAR: Radial and DP pulses 2+ bilaterally. No carotid bruits RESPIRATORY:  Clear to  auscultation without rales, wheezing or rhonchi  ABDOMEN: Soft, non-tender, non-distended MUSCULOSKELETAL:  Ambulates independently SKIN: Warm and dry, trace to 1+ edema, largely nonpitting. No skin changes. NEUROLOGIC:  Alert and oriented x 3. No focal neuro deficits noted. PSYCHIATRIC:  Normal affect    ASSESSMENT:    1. Paroxysmal atrial fibrillation (HCC)   2. Acute combined systolic and diastolic HF (heart failure) ([place])   3. NICM (nonischemic cardiomyopathy) ([place])   4. Family history of heart disease   5. Medication management   6. Diabetes mellitus type 2 in obese (HCC)   7. Cardiac risk counseling   8. Counseling on health promotion and disease prevention   9. Severe mitral regurgitation   10. Severe tricuspid regurgitation   11. Morbid obesity ([place])    PLAN:    Paroxysmal atrial fibrillation: -symptomatic with her atrial fibrillation, reports feeling herself go into it this morning -ECG shows atrial fibrillation with RVR at 105 bpm -adjusting metoprolol dose, as below -CHA2DS2/VAS Stroke Risk Points=4, on dabigatran  Nonischemic cardiomyopathy, with acute on chronic combined systolic and diastolic heart failure: last EF 45-50% -did extensive medication management today -Her medications are somewhat confusing to me. She is on digoxin, diltiazem 60 mg q8 hours (she is only taking twice a day), and metoprolol tartrate 25 mg BID. With her abnormal systolic function, I would prefer for her to be off of diltiazem and on metoprolol succinate instead of tartrate. I would ultimately also like her to be off of digoxin, but we will hold on making too many changes at one time. She has plenty of heart rate and blood pressure room, so I will  start her on metoprolol succinate 100 mg daily. -will see if we can get her a BP cuff to take home measurements -close follow up, with repeat BMET -counseled on red flag warning signs that need to be seen in ER -she has been out of her metolazone for several weeks and is reportedly 15 lbs up today. She does not appear in distress, only mild volume overload on exam. Will restart metolazone. Continue furosemide 40 mg twice daily. Would prefer to manage with single diuretic, such as torsemide, in the future, but will work on getting her back to her baseline volume status first. -she is currently on losartan 25 mg by mouth daily. If renal function allows, and there is blood pressure room, could consider entresto switch. Would also consider SGLT2i as below  Severe MR, severe TR: -seen on prior echo -would like to optimize her cardiomyopathy regimen, then repeat echo -may need TEE in the future to evaluate MR further to determine if she would be a candidate for mitral clip -would be helpful to know if improving her volume status improves her valve regurgitation significantly, hence optimize first  Type II diabetes: -per [facility], last A1c was 10 on 02/2020 -on insulin aspart and levemir -also on dulaglutide (GLP1RA) -with her heart failure, would like her to be on SGLT2i. As she is on insulin, will defer to PCP on this. Will readdress at future visits as well -on rosuvastatin 10 mg daily for diabetic dyslipidemia -per [facility], lipids 08/2019 show Tchol 234, HDL 47, LDL 52, TG 143 -would not add aspirin as she is on dabigatran  Strong family history of heart disease/heart failure: she is unclear of the etiology of this  Cardiac risk counseling and prevention recommendations: -recommend heart healthy/Mediterranean diet, with whole grains, fruits, vegetable, fish, lean meats, nuts, and olive oil. Limit salt. -recommend moderate  walking, 3-5 times/week for 30-50 minutes each session. Aim for at least 150  minutes.week. Goal should be pace of 3 miles/hours, or walking 1.5 miles in 30 minutes -recommend avoidance of tobacco products. Avoid excess alcohol. -Additional risk factor control:  -Weight: BMI 44. Would benefit from weight loss given morbid obesity with comorbidities  -OSA: diagnosed -ASCVD risk score: The 10-year ASCVD risk score Mikey Bussing DC Brooke Bonito., et al., 2013) is: 26.4%   Values used to calculate the score:     Age: 2 years     Sex: Female     Is Non-Hispanic African American: Yes     Diabetic: Yes     Tobacco smoker: No     Systolic Blood Pressure: 767 mmHg     Is BP treated: Yes     HDL Cholesterol: 47 mg/dL     Total Cholesterol: 234 mg/dL    Plan for follow up: 3 weeks, to monitor response to diuresis and medication changes.  Complex medical decision making given multiple comorbidities. Acute heart failure makes her high risk clinically, in need of close follow up.  Buford Dresser, MD, PhD [place]  [facility]    Medication Adjustments/Labs and Tests Ordered: Current medicines are reviewed at length with the patient today.  Concerns regarding medicines are outlined above.  Orders Placed This Encounter  Procedures  . Basic metabolic panel  . EKG 12-Lead   Meds ordered this encounter  Medications  . metolazone (ZAROXOLYN) 5 MG tablet    Sig: Take 1 tablet (5 mg total) by mouth daily.    Dispense:  90 tablet    Refill:  3  . furosemide (LASIX) 40 MG tablet    Sig: Take 1 tablet (40 mg total) by mouth 2 (two) times daily.    Dispense:  180 tablet    Refill:  3  . metoprolol succinate (TOPROL-XL) 100 MG 24 hr tablet    Sig: Take 1 tablet (100 mg total) by mouth daily. Take with or immediately following a meal.    Dispense:  90 tablet    Refill:  3    Patient Instructions  Medication Instructions:  Stop diltiazem (the three times a day/taking twice a day medication). Stop taking metoprolol tartrate 25 mg twice a day START taking metoprolol  succinate 100 mg daily Continue digoxin for now, but we may stop this in the future.  *If you need a refill on your cardiac medications before your next appointment, please call your pharmacy*   Lab Work: Your physician recommends that you return for lab work in 3 week ( BMP).   If you have labs (blood work) drawn today and your tests are completely normal, you will receive your results only by: Marland Kitchen MyChart Message (if you have MyChart) OR . A paper copy in the mail If you have any lab test that is abnormal or we need to change your treatment, we will call you to review the results.   Testing/Procedures: None   Follow-Up: At [facility], you and your health needs are our priority.  As part of our continuing mission to provide you with exceptional heart care, we have created designated Provider Care Teams.  These Care Teams include your primary Cardiologist (physician) and Advanced Practice Providers (APPs -  Physician Assistants and Nurse Practitioners) who all work together to provide you with the care you need, when you need it.  We recommend signing up for the patient portal called "MyChart".  Sign up information is provided  on this After Visit Summary.  MyChart is used to connect with patients for Virtual Visits (Telemedicine).  Patients are able to view lab/test results, encounter notes, upcoming appointments, etc.  Non-urgent messages can be sent to your provider as well.   To learn more about what you can do with MyChart, go to [email or web address].    Your next appointment:   3 week(s)  The format for your next appointment:   In Person  Provider:   Buford Dresser, MD or APP  Please keep a log of your blood pressure/weight and bring to your next appointment.   Do the following things EVERY DAY:  1) Weigh yourself EVERY morning after you go to the bathroom but before you eat or drink anything. Write this number down in a weight log/diary. If you gain 3 pounds  overnight or 5 pounds in a week, call the office.  2) Take your medicines as prescribed. If you have concerns about your medications, please call us before you stop taking them.   3) Eat low salt foods--Limit salt (sodium) to 2000 mg per day. This will help prevent your body from holding onto fluid. Read food labels as many processed foods have a lot of sodium, especially canned goods and prepackaged meats. If you would like some assistance choosing low sodium foods, we would be happy to set you up with a nutritionist.  4) Stay as active as you can everyday. Staying active will give you more energy and make your muscles stronger. Start with 5 minutes at a time and work your way up to 30 minutes a day. Break up your activities--do some in the morning and some in the afternoon. Start with 3 days per week and work your way up to 5 days as you can.  If you have chest pain, feel short of breath, dizzy, or lightheaded, STOP. If you don't feel better after a short rest, call 911. If you do feel better, call the office to let us know you have symptoms with exercise.  5) Limit all fluids for the day to less than 2 liters. Fluid includes all drinks, coffee, juice, ice chips, soup, jello, and all other liquids.    Signed, Buford Dresser, MD PhD 03/31/2020    [place]

## 2020-03-31 NOTE — Patient Instructions (Addendum)
Medication Instructions:  Stop diltiazem (the three times a day/taking twice a day medication). Stop taking metoprolol tartrate 25 mg twice a day START taking metoprolol succinate 100 mg daily Continue digoxin for now, but we may stop this in the future.  *If you need a refill on your cardiac medications before your next appointment, please call your pharmacy*   Lab Work: Your physician recommends that you return for lab work in 3 week ( BMP).   If you have labs (blood work) drawn today and your tests are completely normal, you will receive your results only by: Marland Kitchen MyChart Message (if you have MyChart) OR . A paper copy in the mail If you have any lab test that is abnormal or we need to change your treatment, we will call you to review the results.   Testing/Procedures: None   Follow-Up: At [facility], you and your health needs are our priority.  As part of our continuing mission to provide you with exceptional heart care, we have created designated Provider Care Teams.  These Care Teams include your primary Cardiologist (physician) and Advanced Practice Providers (APPs -  Physician Assistants and Nurse Practitioners) who all work together to provide you with the care you need, when you need it.  We recommend signing up for the patient portal called "MyChart".  Sign up information is provided on this After Visit Summary.  MyChart is used to connect with patients for Virtual Visits (Telemedicine).  Patients are able to view lab/test results, encounter notes, upcoming appointments, etc.  Non-urgent messages can be sent to your provider as well.   To learn more about what you can do with MyChart, go to [email or web address].    Your next appointment:   3 week(s)  The format for your next appointment:   In Person  Provider:   Buford Dresser, MD or APP  Please keep a log of your blood pressure/weight and bring to your next appointment.   Do the following things EVERY  DAY:  1) Weigh yourself EVERY morning after you go to the bathroom but before you eat or drink anything. Write this number down in a weight log/diary. If you gain 3 pounds overnight or 5 pounds in a week, call the office.  2) Take your medicines as prescribed. If you have concerns about your medications, please call us before you stop taking them.   3) Eat low salt foods--Limit salt (sodium) to 2000 mg per day. This will help prevent your body from holding onto fluid. Read food labels as many processed foods have a lot of sodium, especially canned goods and prepackaged meats. If you would like some assistance choosing low sodium foods, we would be happy to set you up with a nutritionist.  4) Stay as active as you can everyday. Staying active will give you more energy and make your muscles stronger. Start with 5 minutes at a time and work your way up to 30 minutes a day. Break up your activities--do some in the morning and some in the afternoon. Start with 3 days per week and work your way up to 5 days as you can.  If you have chest pain, feel short of breath, dizzy, or lightheaded, STOP. If you don't feel better after a short rest, call 911. If you do feel better, call the office to let us know you have symptoms with exercise.  5) Limit all fluids for the day to less than 2 liters. Fluid includes all drinks, coffee, juice, ice  chips, soup, jello, and all other liquids.

## 2020-04-01 ENCOUNTER — Ambulatory Visit: Payer: Medicare HMO

## 2020-04-01 ENCOUNTER — Encounter: Payer: Self-pay | Admitting: Cardiology

## 2020-04-01 DIAGNOSIS — E1122 Type 2 diabetes mellitus with diabetic chronic kidney disease: Secondary | ICD-10-CM | POA: Diagnosis not present

## 2020-04-01 DIAGNOSIS — N1832 Chronic kidney disease, stage 3b: Secondary | ICD-10-CM | POA: Diagnosis not present

## 2020-04-01 DIAGNOSIS — I071 Rheumatic tricuspid insufficiency: Secondary | ICD-10-CM | POA: Insufficient documentation

## 2020-04-01 DIAGNOSIS — I130 Hypertensive heart and chronic kidney disease with heart failure and stage 1 through stage 4 chronic kidney disease, or unspecified chronic kidney disease: Secondary | ICD-10-CM | POA: Diagnosis not present

## 2020-04-01 DIAGNOSIS — D631 Anemia in chronic kidney disease: Secondary | ICD-10-CM | POA: Diagnosis not present

## 2020-04-01 DIAGNOSIS — I428 Other cardiomyopathies: Secondary | ICD-10-CM | POA: Insufficient documentation

## 2020-04-01 DIAGNOSIS — I480 Paroxysmal atrial fibrillation: Secondary | ICD-10-CM | POA: Diagnosis not present

## 2020-04-01 DIAGNOSIS — I340 Nonrheumatic mitral (valve) insufficiency: Secondary | ICD-10-CM | POA: Insufficient documentation

## 2020-04-01 DIAGNOSIS — Z8249 Family history of ischemic heart disease and other diseases of the circulatory system: Secondary | ICD-10-CM | POA: Insufficient documentation

## 2020-04-01 DIAGNOSIS — J449 Chronic obstructive pulmonary disease, unspecified: Secondary | ICD-10-CM | POA: Diagnosis not present

## 2020-04-01 DIAGNOSIS — E1143 Type 2 diabetes mellitus with diabetic autonomic (poly)neuropathy: Secondary | ICD-10-CM | POA: Diagnosis not present

## 2020-04-01 DIAGNOSIS — K3184 Gastroparesis: Secondary | ICD-10-CM | POA: Diagnosis not present

## 2020-04-01 DIAGNOSIS — I5042 Chronic combined systolic (congestive) and diastolic (congestive) heart failure: Secondary | ICD-10-CM | POA: Diagnosis not present

## 2020-04-05 DIAGNOSIS — E1143 Type 2 diabetes mellitus with diabetic autonomic (poly)neuropathy: Secondary | ICD-10-CM | POA: Diagnosis not present

## 2020-04-05 DIAGNOSIS — J449 Chronic obstructive pulmonary disease, unspecified: Secondary | ICD-10-CM | POA: Diagnosis not present

## 2020-04-05 DIAGNOSIS — N1832 Chronic kidney disease, stage 3b: Secondary | ICD-10-CM | POA: Diagnosis not present

## 2020-04-05 DIAGNOSIS — I5042 Chronic combined systolic (congestive) and diastolic (congestive) heart failure: Secondary | ICD-10-CM | POA: Diagnosis not present

## 2020-04-05 DIAGNOSIS — I130 Hypertensive heart and chronic kidney disease with heart failure and stage 1 through stage 4 chronic kidney disease, or unspecified chronic kidney disease: Secondary | ICD-10-CM | POA: Diagnosis not present

## 2020-04-05 DIAGNOSIS — K3184 Gastroparesis: Secondary | ICD-10-CM | POA: Diagnosis not present

## 2020-04-05 DIAGNOSIS — D631 Anemia in chronic kidney disease: Secondary | ICD-10-CM | POA: Diagnosis not present

## 2020-04-05 DIAGNOSIS — E1122 Type 2 diabetes mellitus with diabetic chronic kidney disease: Secondary | ICD-10-CM | POA: Diagnosis not present

## 2020-04-05 DIAGNOSIS — I480 Paroxysmal atrial fibrillation: Secondary | ICD-10-CM | POA: Diagnosis not present

## 2020-04-06 NOTE — Progress Notes (Unsigned)
S:     PCP: Juluis Mire, NP  No chief complaint on file.  Patient arrives in good spirits. Presents for diabetes evaluation, education, and management. Patient was referred and last seen by Primary Care Provider on 03/24/20.  PMH: atrial fibrillation, chronic systolic and diastolic heart failure, OSA, type II diabetes, CKD-3  Patient reports Diabetes was diagnosed ~7-8 years ago.  Notes A1C = 10 (03/02/20) Check Clinic BG Review medications and adherence (timing of meds, etc.)  Ate or drank anything today? At home BGs? (highs vs lows) Hyperglycemia sx (nocturia, neuropathy, visual changes, foot exams) Hypoglycemia symptoms  Insurance -SGLT2 - Farxiga 5 mg daily - $0 copay -potassium was elevated at 5.2 --> will need CMP first  eGFR 37 (7/19)   LDL elevated at 161 -on Crestor 10  -physical exam positive for myalgia - however statin induced not documented -if not muscle pain, can increase crestor to 20 mg daily -Rosuvastatin cut-off: CrCl <30: 5 to 10 mg once daily. -check CMP first, follow-up afterwards if worsening renal function, can switch to atorvastatin   Family/Social History:  -Fhx: CHF (brother, both sisters, granddaughter), MI (mother, 1 sister passed away at age 27-40), HTN (sister, brother) - Tobacco: never smoker - Alcohol: denies use  Insurance coverage/medication affordability: [name] and [place] Medicaid  Medication adherence reported *** .   Current diabetes medications include: Trulicity 1.5 mg weekly, Novolog 5 units TID w/meals, Levemir 40 units BID Current hypertension medications include: Losartan 25 mg daily, Toprol XL 100 mg daily, Lasix 40 mg BID Current hyperlipidemia medications include: Rosuvastatin 10 mg daily  Patient {Actions; denies-reports:120008} hypoglycemic events.  Patient reported dietary habits: Eats *** meals/day Breakfast:*** Lunch:*** Dinner:*** Snacks:*** Drinks:***  Patient-reported exercise habits: ***     Patient {Actions; denies-reports:120008} nocturia (nighttime urination).  Patient {Actions; denies-reports:120008} neuropathy (nerve pain). Patient {Actions; denies-reports:120008} visual changes. Patient {Actions; denies-reports:120008} self foot exams.     O:  POCT:   Lab Results  Component Value Date   HGBA1C 10.0 (H) 03/02/2020   There were no vitals filed for this visit.  Lipid Panel     Component Value Date/Time   CHOL 234 (H) 09/04/2019 1031   TRIG 143 09/04/2019 1031   HDL 47 09/04/2019 1031   CHOLHDL 5.0 (H) 09/04/2019 1031   CHOLHDL 3.1 12/30/2017 0436   VLDL 20 12/30/2017 0436   LDLCALC 161 (H) 09/04/2019 1031    Home fasting blood sugars: ***  2 hour post-meal/random blood sugars: ***.  Clinical Atherosclerotic Cardiovascular Disease (ASCVD): No  The 10-year ASCVD risk score Mikey Bussing DC Jr., et al., 2013) is: 26.4%   Values used to calculate the score:     Age: 64 years     Sex: Female     Is Non-Hispanic African American: Yes     Diabetic: Yes     Tobacco smoker: No     Systolic Blood Pressure: 127 mmHg     Is BP treated: Yes     HDL Cholesterol: 47 mg/dL     Total Cholesterol: 234 mg/dL   Compelling indications: CHF, CKD  A/P: Diabetes longstanding*** currently ***. Patient is *** able to verbalize appropriate hypoglycemia management plan. Medication adherence appears ***. Control is suboptimal due to ***. -{Meds adjust:18428} basal insulin *** (insulin ***). Patient will continue to titrate 1 unit every *** days if fasting blood sugar > 158m/dl until fasting blood sugars reach goal or next visit.  -{Meds adjust:18428}  rapid insulin *** (insulin ***) to ***.  -{  Meds adjust:18428} GLP-1 *** (generic name***) to ***.  -{Meds adjust:18428} SGLT2-I *** (generic name***) to ***. Counseled on sick day rules for ***. -Extensively discussed pathophysiology of diabetes, recommended lifestyle interventions, dietary effects on blood sugar control -Counseled on  s/sx of and management of hypoglycemia -Next A1C anticipated October 2021.   ASCVD risk - primary prevention in patient with diabetes. Last LDL is not controlled at 161. ASCVD risk score is >20%  - high intensity statin indicated.  -{Meds adjust:18428} ***statin *** mg.    Written patient instructions provided.  Total time in face to face counseling *** minutes.   Follow up Pharmacist/PCP*** Clinic Visit in ***.    Lorel Monaco, PharmD PGY2 Ambulatory Care Resident [place]

## 2020-04-07 ENCOUNTER — Ambulatory Visit: Payer: Medicare HMO | Admitting: Pharmacist

## 2020-04-08 DIAGNOSIS — D631 Anemia in chronic kidney disease: Secondary | ICD-10-CM | POA: Diagnosis not present

## 2020-04-08 DIAGNOSIS — K3184 Gastroparesis: Secondary | ICD-10-CM | POA: Diagnosis not present

## 2020-04-08 DIAGNOSIS — E1143 Type 2 diabetes mellitus with diabetic autonomic (poly)neuropathy: Secondary | ICD-10-CM | POA: Diagnosis not present

## 2020-04-08 DIAGNOSIS — I130 Hypertensive heart and chronic kidney disease with heart failure and stage 1 through stage 4 chronic kidney disease, or unspecified chronic kidney disease: Secondary | ICD-10-CM | POA: Diagnosis not present

## 2020-04-08 DIAGNOSIS — I480 Paroxysmal atrial fibrillation: Secondary | ICD-10-CM | POA: Diagnosis not present

## 2020-04-08 DIAGNOSIS — J449 Chronic obstructive pulmonary disease, unspecified: Secondary | ICD-10-CM | POA: Diagnosis not present

## 2020-04-08 DIAGNOSIS — I5042 Chronic combined systolic (congestive) and diastolic (congestive) heart failure: Secondary | ICD-10-CM | POA: Diagnosis not present

## 2020-04-08 DIAGNOSIS — N1832 Chronic kidney disease, stage 3b: Secondary | ICD-10-CM | POA: Diagnosis not present

## 2020-04-08 DIAGNOSIS — E1122 Type 2 diabetes mellitus with diabetic chronic kidney disease: Secondary | ICD-10-CM | POA: Diagnosis not present

## 2020-04-09 ENCOUNTER — Telehealth (INDEPENDENT_AMBULATORY_CARE_PROVIDER_SITE_OTHER): Payer: Self-pay | Admitting: Primary Care

## 2020-04-09 NOTE — Telephone Encounter (Signed)
darlene-[facility] [phone] verbal order given nuring to evaluate and treat for management of medication with education

## 2020-04-09 NOTE — Telephone Encounter (Signed)
[employer] copy with [facility] is calling to requesting order for skilled nursing, medication management and education

## 2020-04-11 DIAGNOSIS — K3184 Gastroparesis: Secondary | ICD-10-CM | POA: Diagnosis not present

## 2020-04-11 DIAGNOSIS — I480 Paroxysmal atrial fibrillation: Secondary | ICD-10-CM | POA: Diagnosis not present

## 2020-04-11 DIAGNOSIS — N1832 Chronic kidney disease, stage 3b: Secondary | ICD-10-CM | POA: Diagnosis not present

## 2020-04-11 DIAGNOSIS — E1122 Type 2 diabetes mellitus with diabetic chronic kidney disease: Secondary | ICD-10-CM | POA: Diagnosis not present

## 2020-04-11 DIAGNOSIS — D631 Anemia in chronic kidney disease: Secondary | ICD-10-CM | POA: Diagnosis not present

## 2020-04-11 DIAGNOSIS — E1143 Type 2 diabetes mellitus with diabetic autonomic (poly)neuropathy: Secondary | ICD-10-CM | POA: Diagnosis not present

## 2020-04-11 DIAGNOSIS — I130 Hypertensive heart and chronic kidney disease with heart failure and stage 1 through stage 4 chronic kidney disease, or unspecified chronic kidney disease: Secondary | ICD-10-CM | POA: Diagnosis not present

## 2020-04-11 DIAGNOSIS — I5042 Chronic combined systolic (congestive) and diastolic (congestive) heart failure: Secondary | ICD-10-CM | POA: Diagnosis not present

## 2020-04-11 DIAGNOSIS — J449 Chronic obstructive pulmonary disease, unspecified: Secondary | ICD-10-CM | POA: Diagnosis not present

## 2020-04-14 DIAGNOSIS — I5042 Chronic combined systolic (congestive) and diastolic (congestive) heart failure: Secondary | ICD-10-CM | POA: Diagnosis not present

## 2020-04-14 DIAGNOSIS — I480 Paroxysmal atrial fibrillation: Secondary | ICD-10-CM | POA: Diagnosis not present

## 2020-04-14 DIAGNOSIS — K3184 Gastroparesis: Secondary | ICD-10-CM | POA: Diagnosis not present

## 2020-04-14 DIAGNOSIS — E1122 Type 2 diabetes mellitus with diabetic chronic kidney disease: Secondary | ICD-10-CM | POA: Diagnosis not present

## 2020-04-14 DIAGNOSIS — E1143 Type 2 diabetes mellitus with diabetic autonomic (poly)neuropathy: Secondary | ICD-10-CM | POA: Diagnosis not present

## 2020-04-14 DIAGNOSIS — I130 Hypertensive heart and chronic kidney disease with heart failure and stage 1 through stage 4 chronic kidney disease, or unspecified chronic kidney disease: Secondary | ICD-10-CM | POA: Diagnosis not present

## 2020-04-14 DIAGNOSIS — D631 Anemia in chronic kidney disease: Secondary | ICD-10-CM | POA: Diagnosis not present

## 2020-04-14 DIAGNOSIS — J449 Chronic obstructive pulmonary disease, unspecified: Secondary | ICD-10-CM | POA: Diagnosis not present

## 2020-04-14 DIAGNOSIS — N1832 Chronic kidney disease, stage 3b: Secondary | ICD-10-CM | POA: Diagnosis not present

## 2020-04-16 ENCOUNTER — Telehealth (INDEPENDENT_AMBULATORY_CARE_PROVIDER_SITE_OTHER): Payer: Self-pay | Admitting: Primary Care

## 2020-04-16 NOTE — Telephone Encounter (Signed)
Called to extend PT 1xwk 2.  Please call to confirm verbal orders at [phone]

## 2020-04-20 NOTE — Progress Notes (Deleted)
S:     PCP: Juluis Mire, NP  PMH: atrial fibrillation, chronic systolic and diastolic heart failure, OSA, type II diabetes, CKD-3 (BL Scr 1.6)  Patient arrives in good spirits ambulating with a cane. Presents for diabetes evaluation, education, and management. Patient was referred and last seen by Primary Care Provider on 03/24/20.  Patient reports Diabetes was diagnosed ~7-8 years ago.  Notes A1C = 10% (03/02/20) Check Clinic BG = 279 (post prandial) Review medications and adherence (timing of meds, etc.)  -Trulicity on Wednesday nights -Novolog 5 units twice daily (orange and blue pen) with breakfast and dinner -Levemir 50 units twice daily (blue and green needle)  Missed one dose of the Levemir This week  Ate or drank anything today? -minute maid grape juice this morning around 9:30am and water At home BGs? (highs vs lows) Left at motel - 2 weeks ago 180, 150s, some in 200s, nothing less than 300 No symptoms of hypoglycemia   Hyperglycemia sx (nocturia, neuropathy yes , visual changes, foot exams) Hypoglycemia symptoms  Plan: -adjust insulin -start SGLT2  - will need CMP first -will send glucometer to [facility] Return to 2-weeks -take Novolog TID as prescibred Taking 50 units BID   Insurance -SGLT2 - Farxiga 5 mg daily or Jardiance 10 mg daily - $0 copay -potassium was elevated at 5.2 --> will need CMP first  eGFR 37 (7/19)   LDL elevated at 161 -on Crestor 10  -physical exam positive for myalgia - however statin induced not documented -if not muscle pain, can increase crestor to 20 mg daily -Rosuvastatin cut-off: CrCl <30: 5 to 10 mg once daily. -check CMP first, follow-up afterwards if worsening renal function, can switch to atorvastatin if not statin-induced myalgia  Family/Social History:  -Fhx: CHF (brother, both sisters, granddaughter), MI (mother, 1 sister passed away at age 26-40), HTN (sister, brother) - Tobacco: never smoker - Alcohol: denies  use  Insurance coverage/medication affordability: [name] and [place] Medicaid  Medication adherence reported *** .   Current diabetes medications include: Trulicity 1.5 mg weekly, Novolog 5 units TID w/meals, Levemir 40 units BID Current hypertension medications include: Losartan 25 mg daily, Toprol XL 100 mg daily, Lasix 40 mg BID, Digoxin 125 mcg daily Current hyperlipidemia medications include: Rosuvastatin 10 mg daily  Patient {Actions; denies-reports:120008} hypoglycemic events.  Patient reported dietary habits:  -breakfast: bacon, eggs, toast, watermelon, juice -dinner: broccoli, mac and cheese, pork chops, lemonade -snack: chicken salad   Patient-reported exercise habits: ***   Patient {Actions; denies-reports:120008} nocturia (nighttime urination).  Patient {Actions; denies-reports:120008} neuropathy (nerve pain). Patient {Actions; denies-reports:120008} visual changes. Patient {Actions; denies-reports:120008} self foot exams.     O:  POCT: 279 (post prandial)  Lab Results  Component Value Date   HGBA1C 10.0 (H) 03/02/2020   There were no vitals filed for this visit.  Lipid Panel     Component Value Date/Time   CHOL 234 (H) 09/04/2019 1031   TRIG 143 09/04/2019 1031   HDL 47 09/04/2019 1031   CHOLHDL 5.0 (H) 09/04/2019 1031   CHOLHDL 3.1 12/30/2017 0436   VLDL 20 12/30/2017 0436   LDLCALC 161 (H) 09/04/2019 1031    Home fasting blood sugars: ***  2 hour post-meal/random blood sugars: ***.  Clinical Atherosclerotic Cardiovascular Disease (ASCVD): No  The 10-year ASCVD risk score Mikey Bussing DC Jr., et al., 2013) is: 26.4%   Values used to calculate the score:     Age: 64 years     Sex: Female  Is Non-Hispanic African American: Yes     Diabetic: Yes     Tobacco smoker: No     Systolic Blood Pressure: 226 mmHg     Is BP treated: Yes     HDL Cholesterol: 47 mg/dL     Total Cholesterol: 234 mg/dL   Compelling indications: CHF, CKD  A/P: Diabetes  longstanding*** currently ***. Patient is *** able to verbalize appropriate hypoglycemia management plan. Medication adherence appears ***. Control is suboptimal due to ***. -{Meds adjust:18428} basal insulin *** (insulin ***). Patient will continue to titrate 1 unit every *** days if fasting blood sugar > 151m/dl until fasting blood sugars reach goal or next visit.  -{Meds adjust:18428}  rapid insulin *** (insulin ***) to ***.  -{Meds adjust:18428} GLP-1 *** (generic name***) to ***.  -{Meds adjust:18428} SGLT2-I *** (generic name***) to ***. Counseled on sick day rules for ***. -Extensively discussed pathophysiology of diabetes, recommended lifestyle interventions, dietary effects on blood sugar control -Counseled on s/sx of and management of hypoglycemia -Next A1C anticipated October 2021.   ASCVD risk - primary prevention in patient with diabetes. Last LDL is not controlled at 161. ASCVD risk score is >20%  - high intensity statin indicated.  -{Meds adjust:18428} ***statin *** mg.    Written patient instructions provided.  Total time in face to face counseling *** minutes.   Follow up Pharmacist/PCP*** Clinic Visit in ***.    ILorel Monaco PharmD PGY2 Ambulatory Care [place]ident [place]

## 2020-04-21 ENCOUNTER — Other Ambulatory Visit: Payer: Self-pay

## 2020-04-21 ENCOUNTER — Ambulatory Visit: Payer: Medicare HMO | Attending: Primary Care | Admitting: Pharmacist

## 2020-04-21 ENCOUNTER — Encounter: Payer: Self-pay | Admitting: Pharmacist

## 2020-04-21 DIAGNOSIS — E669 Obesity, unspecified: Secondary | ICD-10-CM | POA: Diagnosis not present

## 2020-04-21 DIAGNOSIS — E1169 Type 2 diabetes mellitus with other specified complication: Secondary | ICD-10-CM

## 2020-04-21 LAB — GLUCOSE, POCT (MANUAL RESULT ENTRY): POC Glucose: 279 mg/dl — AB (ref 70–99)

## 2020-04-21 MED ORDER — INSULIN DETEMIR 100 UNIT/ML ~~LOC~~ SOLN: SUBCUTANEOUS | 1 refills | Status: DC

## 2020-04-21 MED ORDER — ACCU-CHEK GUIDE VI STRP: ORAL_STRIP | 12 refills | Status: DC

## 2020-04-21 MED ORDER — ACCU-CHEK GUIDE ME W/DEVICE KIT: PACK | 0 refills | Status: DC

## 2020-04-21 MED ORDER — ACCU-CHEK GUIDE VI STRP
ORAL_STRIP | 12 refills | Status: DC
Start: 2020-04-21 — End: 2020-04-21

## 2020-04-21 MED ORDER — ACCU-CHEK SOFTCLIX LANCETS MISC: 12 refills | Status: DC

## 2020-04-21 MED ORDER — ACCU-CHEK GUIDE ME W/DEVICE KIT
PACK | 0 refills | Status: DC
Start: 2020-04-21 — End: 2020-04-21

## 2020-04-21 MED FILL — ACCU-CHEK SOFTCLIX LANCETS: 33 days supply | Qty: 100 | Fill #0

## 2020-04-21 MED FILL — ACCU-CHEK GUIDE W/DEVICE KI: W/DEVICE | 33 days supply | Qty: 1 | Fill #0

## 2020-04-21 NOTE — Progress Notes (Signed)
S PCP: Juluis Mire, NP  PMH: atrial fibrillation, chronic systolic and diastolic heart failure, OSA, type II diabetes, CKD-3 (BL Scr 1.6)  Patient arrives in good spirits, ambulating with a cane. Presents for diabetes evaluation, education, and management. Patient was referred and last seen by Primary Care Provider on 03/24/20.  Patient reports Diabetes was diagnosed ~7-8 years ago.  Today, patient reports she hasn't checked her blood glucose in ~2 weeks since she left her glucometer at the motel when she went to a family reunion. Reports that prior to that her blood sugars were running in the 180s, 150s, some in 200s, nothing less than 300. No hypoglycemic symptoms or blood sugars <70. She did miss one dose of Levemir this week but otherwise has been adherent to medications. She self-adjusted her insulin regimen: increased Levemir to 50 units BID and decreased Novolog to [facility].   Family/Social History:  -Fhx: CHF (brother, both sisters, granddaughter), MI (mother, 1 sister passed away at age 23-40), HTN (sister, brother) - Tobacco: never smoker - Alcohol: denies use  Insurance coverage/medication affordability: [name] and [place] Medicaid  Medication adherence reported as adherent except she missed one dose of Levemir this week.    Current diabetes medications include:  - Trulicity 1.5 mg weekly (Wednesdays) - Novolog 5 units TID w/meals (patient reports taking only twice daily; refers to this pen as "blue and orange" one) - Levemir 40 units BID (patient reports taking 50 units BID; refers to this pen as "blue and green" one)  Current hypertension medications include: Losartan 25 mg daily, Toprol XL 100 mg daily, Lasix 40 mg BID, Digoxin 125 mcg daily Current hyperlipidemia medications include: Rosuvastatin 10 mg daily  Patient denies hypoglycemic events.  Patient reported dietary habits:  -Breakfast: bacon, eggs, toast, watermelon, juice -Dinner: broccoli, mac and  cheese, pork chops, lemonade -Snack: chicken salad    Patient denies nocturia (nighttime urination).  Patient reports neuropathy (nerve pain). Patient denies visual changes.   O:  POCT BG 279 (post-prandial; drank [employer] grape juice this morning)   Lab Results  Component Value Date   HGBA1C 10.0 (H) 03/02/2020   There were no vitals filed for this visit.  Lipid Panel     Component Value Date/Time   CHOL 234 (H) 09/04/2019 1031   TRIG 143 09/04/2019 1031   HDL 47 09/04/2019 1031   CHOLHDL 5.0 (H) 09/04/2019 1031   CHOLHDL 3.1 12/30/2017 0436   VLDL 20 12/30/2017 0436   LDLCALC 161 (H) 09/04/2019 1031    Home fasting blood sugars: has not checked in ~2 weeks due to losing her meter but prior to that were running in the 180, 150s, some in 200s, nothing less than 300 and none <70.   Clinical Atherosclerotic Cardiovascular Disease (ASCVD): No  The 10-year ASCVD risk score Mikey Bussing DC Jr., et al., 2013) is: 26.4%   Values used to calculate the score:     Age: 89 years     Sex: Female     Is Non-Hispanic African American: Yes     Diabetic: Yes     Tobacco smoker: No     Systolic Blood Pressure: 830 mmHg     Is BP treated: Yes     HDL Cholesterol: 47 mg/dL     Total Cholesterol: 234 mg/dL    A/P: Diabetes is currently uncontrolled with most recent A1c of 10% in July 2021. Medication adherence appears to fluctuate--has some missed doses and self adjusting of doses. Patient would  benefit from addition of SGLT2 inhibitor given uncontrolled diabetes and comorbid heart failure. Will need updated labs to ensure kidney function and electrolytes are ok prior to starting.  -Extensively discussed pathophysiology of diabetes, recommended lifestyle interventions, dietary effects on blood sugar control -Counseled on s/sx of and management of hypoglycemia -Counseled on lifestyle modifications (diet, exercise) -Next A1C anticipated October 2021.   -Reviewed goal blood glucose of 80-130  before meals and <180 after meals.  -Continue Levemir 50 units BID -Begin taking Novolog 5 units TID before meals -Continue Trulicity once weekly on Wednesday nights -New glucometer sent in to pharmacy. Patient counseled to bring glucometer/log of blood sugars to next visit.  -Will consider addition of SGLT2 inhibitor at next visit pending lab results from today's visit  Written patient instructions provided.  Total time in face to face counseling 30 minutes.   Follow up Pharmacist Clinic Visit in 2 weeks.    Rebbeca Paul, PharmD PGY1 Pharmacy Resident 04/21/2020 3:51 PM

## 2020-04-21 NOTE — Patient Instructions (Addendum)
It was nice to see you today!  Your goal blood sugar is 80-130 before eating and less than 180 after eating. In clinic, your blood sugar was elevated at 279 mg/dL.  Medication Changes: Begin taking Novolog 5 units three times daily with meals (breakfast, lunch, dinner) -  Continue taking Levemir 50 units in the morning and 50 units in the evenings ((blue and green needle)  Continue Trulicity once weekly on Wednesday nights  Monitor blood sugars at home and keep a log (glucometer or piece of paper) to bring with you to your next visit.  Keep up the good work with diet and exercise. Aim for a diet full of vegetables, fruit and lean meats (chicken, [place], fish). Try to limit salt intake by eating fresh or frozen vegetables (instead of canned), rinse canned vegetables prior to cooking and do not add any additional salt to meals.   See you in two weeks!

## 2020-04-22 DIAGNOSIS — E1143 Type 2 diabetes mellitus with diabetic autonomic (poly)neuropathy: Secondary | ICD-10-CM | POA: Diagnosis not present

## 2020-04-22 DIAGNOSIS — I130 Hypertensive heart and chronic kidney disease with heart failure and stage 1 through stage 4 chronic kidney disease, or unspecified chronic kidney disease: Secondary | ICD-10-CM | POA: Diagnosis not present

## 2020-04-22 DIAGNOSIS — E1122 Type 2 diabetes mellitus with diabetic chronic kidney disease: Secondary | ICD-10-CM | POA: Diagnosis not present

## 2020-04-22 DIAGNOSIS — N1832 Chronic kidney disease, stage 3b: Secondary | ICD-10-CM | POA: Diagnosis not present

## 2020-04-22 DIAGNOSIS — J449 Chronic obstructive pulmonary disease, unspecified: Secondary | ICD-10-CM | POA: Diagnosis not present

## 2020-04-22 DIAGNOSIS — D631 Anemia in chronic kidney disease: Secondary | ICD-10-CM | POA: Diagnosis not present

## 2020-04-22 DIAGNOSIS — I480 Paroxysmal atrial fibrillation: Secondary | ICD-10-CM | POA: Diagnosis not present

## 2020-04-22 DIAGNOSIS — I5042 Chronic combined systolic (congestive) and diastolic (congestive) heart failure: Secondary | ICD-10-CM | POA: Diagnosis not present

## 2020-04-22 DIAGNOSIS — K3184 Gastroparesis: Secondary | ICD-10-CM | POA: Diagnosis not present

## 2020-04-22 LAB — COMPREHENSIVE METABOLIC PANEL
ALT: 17 IU/L (ref 0–32)
AST: 20 IU/L (ref 0–40)
Albumin/Globulin Ratio: 1 — ABNORMAL LOW (ref 1.2–2.2)
Albumin: 4 g/dL (ref 3.8–4.8)
Alkaline Phosphatase: 123 IU/L — ABNORMAL HIGH (ref 48–121)
BUN/Creatinine Ratio: 13 (ref 12–28)
BUN: 24 mg/dL (ref 8–27)
Bilirubin Total: 0.3 mg/dL (ref 0.0–1.2)
CO2: 29 mmol/L (ref 20–29)
Calcium: 9.7 mg/dL (ref 8.7–10.3)
Chloride: 95 mmol/L — ABNORMAL LOW (ref 96–106)
Creatinine, Ser: 1.91 mg/dL — ABNORMAL HIGH (ref 0.57–1.00)
GFR calc Af Amer: 32 mL/min/{1.73_m2} — ABNORMAL LOW (ref >59)
GFR calc non Af Amer: 27 mL/min/{1.73_m2} — ABNORMAL LOW (ref >59)
Globulin, Total: 4 g/dL (ref 1.5–4.5)
Glucose: 275 mg/dL — ABNORMAL HIGH (ref 65–99)
Potassium: 3.5 mmol/L (ref 3.5–5.2)
Sodium: 140 mmol/L (ref 134–144)
Total Protein: 8 g/dL (ref 6.0–8.5)

## 2020-04-23 DIAGNOSIS — E1143 Type 2 diabetes mellitus with diabetic autonomic (poly)neuropathy: Secondary | ICD-10-CM | POA: Diagnosis not present

## 2020-04-23 DIAGNOSIS — D631 Anemia in chronic kidney disease: Secondary | ICD-10-CM | POA: Diagnosis not present

## 2020-04-23 DIAGNOSIS — I5042 Chronic combined systolic (congestive) and diastolic (congestive) heart failure: Secondary | ICD-10-CM | POA: Diagnosis not present

## 2020-04-23 DIAGNOSIS — I130 Hypertensive heart and chronic kidney disease with heart failure and stage 1 through stage 4 chronic kidney disease, or unspecified chronic kidney disease: Secondary | ICD-10-CM | POA: Diagnosis not present

## 2020-04-23 DIAGNOSIS — J449 Chronic obstructive pulmonary disease, unspecified: Secondary | ICD-10-CM | POA: Diagnosis not present

## 2020-04-23 DIAGNOSIS — E1122 Type 2 diabetes mellitus with diabetic chronic kidney disease: Secondary | ICD-10-CM | POA: Diagnosis not present

## 2020-04-23 DIAGNOSIS — K3184 Gastroparesis: Secondary | ICD-10-CM | POA: Diagnosis not present

## 2020-04-23 DIAGNOSIS — I480 Paroxysmal atrial fibrillation: Secondary | ICD-10-CM | POA: Diagnosis not present

## 2020-04-23 DIAGNOSIS — N1832 Chronic kidney disease, stage 3b: Secondary | ICD-10-CM | POA: Diagnosis not present

## 2020-04-23 MED FILL — ACCU-CHEK GUIDE TEST STRIP: 33 days supply | Qty: 100 | Fill #0

## 2020-04-25 DIAGNOSIS — S82891A Other fracture of right lower leg, initial encounter for closed fracture: Secondary | ICD-10-CM | POA: Diagnosis not present

## 2020-04-30 ENCOUNTER — Encounter: Payer: Self-pay | Admitting: Cardiology

## 2020-04-30 ENCOUNTER — Other Ambulatory Visit: Payer: Self-pay

## 2020-04-30 ENCOUNTER — Ambulatory Visit (INDEPENDENT_AMBULATORY_CARE_PROVIDER_SITE_OTHER): Payer: Medicare HMO | Admitting: Cardiology

## 2020-04-30 VITALS — BP 124/82 | HR 94 | Ht 67.0 in | Wt 267.4 lb

## 2020-04-30 DIAGNOSIS — I428 Other cardiomyopathies: Secondary | ICD-10-CM

## 2020-04-30 DIAGNOSIS — I340 Nonrheumatic mitral (valve) insufficiency: Secondary | ICD-10-CM

## 2020-04-30 DIAGNOSIS — I5042 Chronic combined systolic (congestive) and diastolic (congestive) heart failure: Secondary | ICD-10-CM | POA: Diagnosis not present

## 2020-04-30 DIAGNOSIS — I480 Paroxysmal atrial fibrillation: Secondary | ICD-10-CM | POA: Diagnosis not present

## 2020-04-30 DIAGNOSIS — I071 Rheumatic tricuspid insufficiency: Secondary | ICD-10-CM | POA: Diagnosis not present

## 2020-04-30 NOTE — H&P (View-Only) (Signed)
Cardiology Office Note:    Date:  04/30/2020   ID:  [name], DOB [birth year], MRN [id number]  PCP:  [name], NP  Cardiologist:  Buford Dresser, MD  Referring MD: [name], NP   CC: follow up  History of Present Illness:    [name] is a 64 y.o. female with a hx of atrial fibrillation, chronic systolic and diastolic heart failure, OSA, type II diabetes who is seen for follow up today. I initially saw her 03/31/20 as a new consult at the request of [name], NP for the evaluation and management of atrial fibrillation, heart failure.  Today: Still easily fatigued/short of breath. Intermittent LE edema. Weight range has been 267-262. We discussed options for further management.  Discussed cardioversion today with patient and her daughter, they wish to proceed.  Denies chest pain. No PND, orthopnea, or unexpected weight gain. No syncope or palpitations.   Past Medical History:  Diagnosis Date  . Allergic rhinitis   . Arthritis   . Asthma   . Chest pain 12/27/2017  . Chronic diastolic CHF (congestive heart failure) ([place])   . COPD (chronic obstructive pulmonary disease) ([place])   . Depression   . DM (diabetes mellitus) ([place])   . DVT (deep vein thrombosis) in pregnancy   . Dysrhythmia    atrial fibrilation  . GERD (gastroesophageal reflux disease)   . Headache(784.0)   . HTN (hypertension)   . Hyperlipidemia   . Obesity   . Pneumonia 04/2018   RIGHT LOBE  . Shortness of breath   . Sleep apnea    compliant with CPAP    Past Surgical History:  Procedure Laterality Date  . ABDOMINAL HYSTERECTOMY    . CATARACT EXTRACTION, BILATERAL    . CHOLECYSTECTOMY    . COLONOSCOPY WITH PROPOFOL N/A 03/05/2015   Procedure: COLONOSCOPY WITH PROPOFOL;  Surgeon: Carol Ada, MD;  Location: [facility];  Service: Endoscopy;  Laterality: N/A;  . DIAGNOSTIC LAPAROSCOPY    . ingrown hallux Left   . KNEE SURGERY    . LEFT HEART  CATH AND CORONARY ANGIOGRAPHY N/A 12/31/2017   Procedure: LEFT HEART CATH AND CORONARY ANGIOGRAPHY;  Surgeon: Charolette Forward, MD;  Location: [facility];  Service: Cardiovascular;  Laterality: N/A;    Current Medications: Current Outpatient Medications on File Prior to Visit  Medication Sig  . Accu-Chek Softclix Lancets lancets Use as instructed to check blood sugar three times daily. E11.69  . albuterol (VENTOLIN HFA) 108 (90 Base) MCG/ACT inhaler Inhale 2 puffs into the lungs every 4 (four) hours as needed for wheezing or shortness of breath.   . Alcohol Swabs (B-D SINGLE USE SWABS REGULAR) PADS   . Blood Glucose Monitoring Suppl (ACCU-CHEK GUIDE ME) w/Device KIT Use as instructed to check blood sugar three times daily. E11.69  . dabigatran (PRADAXA) 150 MG CAPS capsule Take 1 capsule (150 mg total) by mouth 2 (two) times daily.  . digoxin (LANOXIN) 0.125 MG tablet Take 125 mcg by mouth daily.  . Dulaglutide (TRULICITY) 1.5 DV/7.6HY SOPN Inject 0.5 mLs (1.5 mg total) into the skin once a week.  . DULoxetine (CYMBALTA) 60 MG capsule Take 1 capsule (60 mg total) by mouth daily.  . furosemide (LASIX) 40 MG tablet Take 1 tablet (40 mg total) by mouth 2 (two) times daily.  Marland Kitchen gabapentin (NEURONTIN) 300 MG capsule Take 2 capsules in the morning and at bedtime  . glucose blood (ACCU-CHEK GUIDE) test strip Use as instructed to  check blood sugar three times daily. E11.69  . insulin aspart (NOVOLOG) 100 UNIT/ML injection Inject 5 Units into the skin 3 (three) times daily with meals.  . insulin detemir (LEVEMIR) 100 UNIT/ML injection INJECT 0.5 MLS (50 UNITS TOTAL) INTO THE SKIN 2 (TWO) TIMES DAILY.  Marland Kitchen Insulin Syringe-Needle U-100 (TRUEPLUS INSULIN SYRINGE) 31G X 5/16" 0.3 ML MISC Use to administer insulin every day.  . metolazone (ZAROXOLYN) 5 MG tablet Take 1 tablet (5 mg total) by mouth daily.  . metoprolol succinate (TOPROL-XL) 100 MG 24 hr tablet Take 1 tablet (100 mg total) by mouth daily.  Take with or immediately following a meal.  . Multiple Vitamins-Minerals (MULTIVITAMIN WOMEN PO) Take 1 tablet by mouth daily.   . nitroGLYCERIN (NITROSTAT) 0.4 MG SL tablet Place 1 tablet (0.4 mg total) under the tongue every 5 (five) minutes x 3 doses as needed for chest pain.  Marland Kitchen ondansetron (ZOFRAN) 4 MG tablet Take 1 tablet (4 mg total) by mouth every 6 (six) hours as needed for nausea.  . pantoprazole (PROTONIX) 40 MG tablet Take 1 tablet (40 mg total) by mouth 2 (two) times daily.  . RESTASIS 0.05 % ophthalmic emulsion Place 1 drop into both eyes 2 (two) times daily.  . rosuvastatin (CRESTOR) 10 MG tablet Take 1 tablet (10 mg total) by mouth daily.  . traMADol (ULTRAM) 50 MG tablet Take 1 tablet (50 mg total) by mouth every 8 (eight) hours as needed. (Patient taking differently: Take 50 mg by mouth every 8 (eight) hours as needed (pain). )   No current facility-administered medications on file prior to visit.     Allergies:   Sulfa antibiotics   Social History   Tobacco Use  . Smoking status: Never Smoker  . Smokeless tobacco: Never Used  Vaping Use  . Vaping Use: Never used  Substance Use Topics  . Alcohol use: No  . Drug use: No    Family History: family history includes CAD in an other family member; Cancer in her father and mother; Hypertension in her brother, mother, and sister.  ROS:   Please see the history of present illness.  Additional pertinent ROS otherwise unremarkable.  EKGs/Labs/Other Studies Reviewed:    The following studies were reviewed today: Echo 07/02/2018 - Left ventricle: The cavity size was normal. Systolic function was  mildly reduced. The estimated ejection fraction was in the range  of 45% to 50%. There is moderate hypokinesis of the  entireanteroseptal myocardium.  - Aortic valve: There was mild regurgitation.  - Mitral valve: Mildly dilated, calcified annulus. There was severe  regurgitation.  - Left atrium: The atrium was  severely dilated.  - Right ventricle: The cavity size was mildly dilated. Wall  thickness was normal. Systolic function was mildly to moderately  reduced.  - Right atrium: The atrium was severely dilated.  - Tricuspid valve: There was severe regurgitation.  - Pulmonary arteries: Systolic pressure was mildly increased. PA  peak pressure: 31 mm Hg (S).   Cath 12/31/2017  Prox LAD lesion is 10% stenosed.  Dist LM to Ost LAD lesion is 10% stenosed.  Ost Cx lesion is 15% stenosed.  The left ventricular systolic function is normal.  LV end diastolic pressure is normal.   EKG:  EKG is personally reviewed.  The ekg ordered today demonstrates atrial fibrillation, rate 94 bpm  Recent Labs: 12/23/2019: TSH 2.820 03/02/2020: B Natriuretic Peptide 43.7 03/08/2020: Hemoglobin 11.6; Magnesium 1.8; Platelets 259 04/21/2020: ALT 17; BUN 24; Creatinine, Ser 1.91;  Potassium 3.5; Sodium 140  Recent Lipid Panel    Component Value Date/Time   CHOL 234 (H) 09/04/2019 1031   TRIG 143 09/04/2019 1031   HDL 47 09/04/2019 1031   CHOLHDL 5.0 (H) 09/04/2019 1031   CHOLHDL 3.1 12/30/2017 0436   VLDL 20 12/30/2017 0436   LDLCALC 161 (H) 09/04/2019 1031    Physical Exam:    VS:  BP 124/82   Pulse 94   Ht $R'5\' 7"'DU$  (1.702 m)   Wt 267 lb 6.4 oz (121.3 kg)   SpO2 93%   BMI 41.88 kg/m     Wt Readings from Last 3 Encounters:  04/30/20 267 lb 6.4 oz (121.3 kg)  03/31/20 285 lb 12.8 oz (129.6 kg)  03/24/20 284 lb (128.8 kg)    GEN: Well nourished, well developed in no acute distress HEENT: Normal, moist mucous membranes NECK: JVD at clavicle, prominent TR pulsation CARDIAC: irregularly irregular rhythm, normal S1 and S2, no rubs or gallops. 2.6 HS murmur. VASCULAR: Radial and DP pulses 2+ bilaterally. No carotid bruits RESPIRATORY:  Clear to auscultation without rales, wheezing or rhonchi  ABDOMEN: Soft, non-tender, non-distended MUSCULOSKELETAL:  Ambulates independently SKIN: Warm and dry, trace  to 1+ largely nonpitting edema NEUROLOGIC:  Alert and oriented x 3. No focal neuro deficits noted. PSYCHIATRIC:  Normal affect   ASSESSMENT:    1. Paroxysmal atrial fibrillation (HCC)   2. NICM (nonischemic cardiomyopathy) ([place])   3. Chronic combined systolic and diastolic heart failure ([place])   4. Severe mitral regurgitation   5. Severe tricuspid regurgitation    PLAN:    Paroxysmal atrial fibrillation: -we discussed options for management. After shared decision making, will proceed with cardioversion -CHA2DS2/VAS Stroke Risk Points=4, on dabigatran, no missed doses  Nonischemic cardiomyopathy, with acute on chronic combined systolic and diastolic heart failure: last EF 45-50% -see prior med management -on digoxin, dulaglutide, furosemide, metolazone -now on metoprolol succinate -was on losartan previously. This was held due to elevated Cr to 1.91. No room to add ACEI/ARB/ARNI/MRA currently due to renal function, will follow up post cardioversion.  Severe MR, severe TR: -seen on prior echo -would like to optimize her cardiomyopathy regimen, then repeat echo -may need TEE in the future to evaluate MR further to determine if she would be a candidate for mitral clip -would be helpful to know if improving her volume status improves her valve regurgitation significantly, hence optimize first  Type II diabetes: -per [facility], last A1c was 10 on 02/2020 -on insulin aspart and levemir -also on dulaglutide (GLP1RA) -with her heart failure, would like her to be on SGLT2i. As she is on insulin, will defer to PCP on this. Will readdress at future visits as well -on rosuvastatin 10 mg daily for diabetic dyslipidemia -per [facility], lipids 08/2019 show Tchol 234, HDL 47, LDL 52, TG 143 -would not add aspirin as she is on dabigatran  Strong family history of heart disease/heart failure: she is unclear of the etiology of this  Cardiac risk counseling and prevention recommendations: -recommend heart  healthy/Mediterranean diet, with whole grains, fruits, vegetable, fish, lean meats, nuts, and olive oil. Limit salt. -recommend moderate walking, 3-5 times/week for 30-50 minutes each session. Aim for at least 150 minutes.week. Goal should be pace of 3 miles/hours, or walking 1.5 miles in 30 minutes -recommend avoidance of tobacco products. Avoid excess alcohol. -Additional risk factor control:  -Weight: BMI 44. Would benefit from weight loss given morbid obesity with comorbidities  -OSA: diagnosed -ASCVD risk score: The  10-year ASCVD risk score Mikey Bussing DC Brooke Bonito., et al., 2013) is: 20.6%   Values used to calculate the score:     Age: 74 years     Sex: Female     Is Non-Hispanic African American: Yes     Diabetic: Yes     Tobacco smoker: No     Systolic Blood Pressure: 712 mmHg     Is BP treated: Yes     HDL Cholesterol: 47 mg/dL     Total Cholesterol: 234 mg/dL    Plan for follow up: cardioversion, then close post cardioversion follow up  Buford Dresser, MD, PhD [place]  [facility]    Medication Adjustments/Labs and Tests Ordered: Current medicines are reviewed at length with the patient today.  Concerns regarding medicines are outlined above.  Orders Placed This Encounter  Procedures  . CBC  . EKG 12-Lead   No orders of the defined types were placed in this encounter.   Patient Instructions  Medication Instructions:  Your Physician recommend you continue on your current medication as directed.    *If you need a refill on your cardiac medications before your next appointment, please call your pharmacy*   Lab Work: Your physician recommends that you return for lab work today ( CBC)  If you have labs (blood work) drawn today and your tests are completely normal, you will receive your results only by: Marland Kitchen MyChart Message (if you have MyChart) OR . A paper copy in the mail If you have any lab test that is abnormal or we need to change your treatment, we will call  you to review the results.   Testing/Procedures: [place] will need to have the coronavirus test completed prior to your procedure. An appointment has been made at 12:15 am on Monday 05/03/20. This is a Drive Up Visit at [address], [place], [place] [address]. Please tell them that you are there for procedure testing. Stay in your car and someone will be with you shortly. Please make sure to have all other labs completed before this test because you will need to stay quarantined until your procedure.    Follow-Up: At [facility], you and your health needs are our priority.  As part of our continuing mission to provide you with exceptional heart care, we have created designated Provider Care Teams.  These Care Teams include your primary Cardiologist (physician) and Advanced Practice Providers (APPs -  Physician Assistants and Nurse Practitioners) who all work together to provide you with the care you need, when you need it.  We recommend signing up for the patient portal called "MyChart".  Sign up information is provided on this After Visit Summary.  MyChart is used to connect with patients for Virtual Visits (Telemedicine).  Patients are able to view lab/test results, encounter notes, upcoming appointments, etc.  Non-urgent messages can be sent to your provider as well.   To learn more about what you can do with MyChart, go to [email or web address].    Your next appointment:   2 month(s)  The format for your next appointment:   In Person  Provider:   Buford Dresser, MD   You are scheduled for a Cardioversion on May 06, 2020 with Dr. Harrell Gave.  Please arrive at the [facility] (Main Entrance A) at [facility]: [address] [place], [place] [address] at 7:30 am   DIET: Nothing to eat or drink after midnight except a sip of water with  medications (see medication instructions below)  Medication Instructions: Take 1/2  Levemir dose the night before  The morning of procedure hold Levemir and Novolog  Continue your anticoagulant: Pradaxa  You will need to continue your anticoagulant after your procedure until you  are told by your  Provider that it is safe to stop   Labs: Today ( CBC)  You must have a responsible person to drive you home and stay in the waiting area during your procedure. Failure to do so could result in cancellation.  Bring your insurance cards.  *Special Note: Every effort is made to have your procedure done on time. Occasionally there are emergencies that occur at the hospital that may cause delays. Please be patient if a delay does occur.      Signed, Buford Dresser, MD PhD 04/30/2020    [place]

## 2020-04-30 NOTE — Progress Notes (Signed)
Cardiology Office Note:    Date:  04/30/2020   ID:  [name], DOB Jan 24, 1956, MRN [id number]  PCP:  [name], NP  Cardiologist:  Buford Dresser, MD  Referring MD: [name], NP   CC: follow up  History of Present Illness:    [name] is a 64 y.o. female with a hx of atrial fibrillation, chronic systolic and diastolic heart failure, OSA, type II diabetes who is seen for follow up today. I initially saw her 03/31/20 as a new consult at the request of [name], NP for the evaluation and management of atrial fibrillation, heart failure.  Today: Still easily fatigued/short of breath. Intermittent LE edema. Weight range has been 267-262. We discussed options for further management.  Discussed cardioversion today with patient and her daughter, they wish to proceed.  Denies chest pain. No PND, orthopnea, or unexpected weight gain. No syncope or palpitations.   Past Medical History:  Diagnosis Date  . Allergic rhinitis   . Arthritis   . Asthma   . Chest pain 12/27/2017  . Chronic diastolic CHF (congestive heart failure) ([place])   . COPD (chronic obstructive pulmonary disease) ([place])   . Depression   . DM (diabetes mellitus) ([place])   . DVT (deep vein thrombosis) in pregnancy   . Dysrhythmia    atrial fibrilation  . GERD (gastroesophageal reflux disease)   . Headache(784.0)   . HTN (hypertension)   . Hyperlipidemia   . Obesity   . Pneumonia 04/2018   RIGHT LOBE  . Shortness of breath   . Sleep apnea    compliant with CPAP    Past Surgical History:  Procedure Laterality Date  . ABDOMINAL HYSTERECTOMY    . CATARACT EXTRACTION, BILATERAL    . CHOLECYSTECTOMY    . COLONOSCOPY WITH PROPOFOL N/A 03/05/2015   Procedure: COLONOSCOPY WITH PROPOFOL;  Surgeon: Carol Ada, MD;  Location: [facility];  Service: Endoscopy;  Laterality: N/A;  . DIAGNOSTIC LAPAROSCOPY    . ingrown hallux Left   . KNEE SURGERY    . LEFT HEART  CATH AND CORONARY ANGIOGRAPHY N/A 12/31/2017   Procedure: LEFT HEART CATH AND CORONARY ANGIOGRAPHY;  Surgeon: Charolette Forward, MD;  Location: [facility];  Service: Cardiovascular;  Laterality: N/A;    Current Medications: Current Outpatient Medications on File Prior to Visit  Medication Sig  . Accu-Chek Softclix Lancets lancets Use as instructed to check blood sugar three times daily. E11.69  . albuterol (VENTOLIN HFA) 108 (90 Base) MCG/ACT inhaler Inhale 2 puffs into the lungs every 4 (four) hours as needed for wheezing or shortness of breath.   . Alcohol Swabs (B-D SINGLE USE SWABS REGULAR) PADS   . Blood Glucose Monitoring Suppl (ACCU-CHEK GUIDE ME) w/Device KIT Use as instructed to check blood sugar three times daily. E11.69  . dabigatran (PRADAXA) 150 MG CAPS capsule Take 1 capsule (150 mg total) by mouth 2 (two) times daily.  . digoxin (LANOXIN) 0.125 MG tablet Take 125 mcg by mouth daily.  . Dulaglutide (TRULICITY) 1.5 EP/3.2RJ SOPN Inject 0.5 mLs (1.5 mg total) into the skin once a week.  . DULoxetine (CYMBALTA) 60 MG capsule Take 1 capsule (60 mg total) by mouth daily.  . furosemide (LASIX) 40 MG tablet Take 1 tablet (40 mg total) by mouth 2 (two) times daily.  Marland Kitchen gabapentin (NEURONTIN) 300 MG capsule Take 2 capsules in the morning and at bedtime  . glucose blood (ACCU-CHEK GUIDE) test strip Use as instructed to  check blood sugar three times daily. E11.69  . insulin aspart (NOVOLOG) 100 UNIT/ML injection Inject 5 Units into the skin 3 (three) times daily with meals.  . insulin detemir (LEVEMIR) 100 UNIT/ML injection INJECT 0.5 MLS (50 UNITS TOTAL) INTO THE SKIN 2 (TWO) TIMES DAILY.  Marland Kitchen Insulin Syringe-Needle U-100 (TRUEPLUS INSULIN SYRINGE) 31G X 5/16" 0.3 ML MISC Use to administer insulin every day.  . metolazone (ZAROXOLYN) 5 MG tablet Take 1 tablet (5 mg total) by mouth daily.  . metoprolol succinate (TOPROL-XL) 100 MG 24 hr tablet Take 1 tablet (100 mg total) by mouth daily.  Take with or immediately following a meal.  . Multiple Vitamins-Minerals (MULTIVITAMIN WOMEN PO) Take 1 tablet by mouth daily.   . nitroGLYCERIN (NITROSTAT) 0.4 MG SL tablet Place 1 tablet (0.4 mg total) under the tongue every 5 (five) minutes x 3 doses as needed for chest pain.  Marland Kitchen ondansetron (ZOFRAN) 4 MG tablet Take 1 tablet (4 mg total) by mouth every 6 (six) hours as needed for nausea.  . pantoprazole (PROTONIX) 40 MG tablet Take 1 tablet (40 mg total) by mouth 2 (two) times daily.  . RESTASIS 0.05 % ophthalmic emulsion Place 1 drop into both eyes 2 (two) times daily.  . rosuvastatin (CRESTOR) 10 MG tablet Take 1 tablet (10 mg total) by mouth daily.  . traMADol (ULTRAM) 50 MG tablet Take 1 tablet (50 mg total) by mouth every 8 (eight) hours as needed. (Patient taking differently: Take 50 mg by mouth every 8 (eight) hours as needed (pain). )   No current facility-administered medications on file prior to visit.     Allergies:   Sulfa antibiotics   Social History   Tobacco Use  . Smoking status: Never Smoker  . Smokeless tobacco: Never Used  Vaping Use  . Vaping Use: Never used  Substance Use Topics  . Alcohol use: No  . Drug use: No    Family History: family history includes CAD in an other family member; Cancer in her father and mother; Hypertension in her brother, mother, and sister.  ROS:   Please see the history of present illness.  Additional pertinent ROS otherwise unremarkable.  EKGs/Labs/Other Studies Reviewed:    The following studies were reviewed today: Echo 07/02/2018 - Left ventricle: The cavity size was normal. Systolic function was  mildly reduced. The estimated ejection fraction was in the range  of 45% to 50%. There is moderate hypokinesis of the  entireanteroseptal myocardium.  - Aortic valve: There was mild regurgitation.  - Mitral valve: Mildly dilated, calcified annulus. There was severe  regurgitation.  - Left atrium: The atrium was  severely dilated.  - Right ventricle: The cavity size was mildly dilated. Wall  thickness was normal. Systolic function was mildly to moderately  reduced.  - Right atrium: The atrium was severely dilated.  - Tricuspid valve: There was severe regurgitation.  - Pulmonary arteries: Systolic pressure was mildly increased. PA  peak pressure: 31 mm Hg (S).   Cath 12/31/2017  Prox LAD lesion is 10% stenosed.  Dist LM to Ost LAD lesion is 10% stenosed.  Ost Cx lesion is 15% stenosed.  The left ventricular systolic function is normal.  LV end diastolic pressure is normal.   EKG:  EKG is personally reviewed.  The ekg ordered today demonstrates atrial fibrillation, rate 94 bpm  Recent Labs: 12/23/2019: TSH 2.820 03/02/2020: B Natriuretic Peptide 43.7 03/08/2020: Hemoglobin 11.6; Magnesium 1.8; Platelets 259 04/21/2020: ALT 17; BUN 24; Creatinine, Ser 1.91;  Potassium 3.5; Sodium 140  Recent Lipid Panel    Component Value Date/Time   CHOL 234 (H) 09/04/2019 1031   TRIG 143 09/04/2019 1031   HDL 47 09/04/2019 1031   CHOLHDL 5.0 (H) 09/04/2019 1031   CHOLHDL 3.1 12/30/2017 0436   VLDL 20 12/30/2017 0436   LDLCALC 161 (H) 09/04/2019 1031    Physical Exam:    VS:  BP 124/82   Pulse 94   Ht $R'5\' 7"'Ad$  (1.702 m)   Wt 267 lb 6.4 oz (121.3 kg)   SpO2 93%   BMI 41.88 kg/m     Wt Readings from Last 3 Encounters:  04/30/20 267 lb 6.4 oz (121.3 kg)  03/31/20 285 lb 12.8 oz (129.6 kg)  03/24/20 284 lb (128.8 kg)    GEN: Well nourished, well developed in no acute distress HEENT: Normal, moist mucous membranes NECK: JVD at clavicle, prominent TR pulsation CARDIAC: irregularly irregular rhythm, normal S1 and S2, no rubs or gallops. 2.6 HS murmur. VASCULAR: Radial and DP pulses 2+ bilaterally. No carotid bruits RESPIRATORY:  Clear to auscultation without rales, wheezing or rhonchi  ABDOMEN: Soft, non-tender, non-distended MUSCULOSKELETAL:  Ambulates independently SKIN: Warm and dry, trace  to 1+ largely nonpitting edema NEUROLOGIC:  Alert and oriented x 3. No focal neuro deficits noted. PSYCHIATRIC:  Normal affect   ASSESSMENT:    1. Paroxysmal atrial fibrillation (HCC)   2. NICM (nonischemic cardiomyopathy) ([place])   3. Chronic combined systolic and diastolic heart failure ([place])   4. Severe mitral regurgitation   5. Severe tricuspid regurgitation    PLAN:    Paroxysmal atrial fibrillation: -we discussed options for management. After shared decision making, will proceed with cardioversion -CHA2DS2/VAS Stroke Risk Points=4, on dabigatran, no missed doses  Nonischemic cardiomyopathy, with acute on chronic combined systolic and diastolic heart failure: last EF 45-50% -see prior med management -on digoxin, dulaglutide, furosemide, metolazone -now on metoprolol succinate -was on losartan previously. This was held due to elevated Cr to 1.91. No room to add ACEI/ARB/ARNI/MRA currently due to renal function, will follow up post cardioversion.  Severe MR, severe TR: -seen on prior echo -would like to optimize her cardiomyopathy regimen, then repeat echo -may need TEE in the future to evaluate MR further to determine if she would be a candidate for mitral clip -would be helpful to know if improving her volume status improves her valve regurgitation significantly, hence optimize first  Type II diabetes: -per [facility], last A1c was 10 on 02/2020 -on insulin aspart and levemir -also on dulaglutide (GLP1RA) -with her heart failure, would like her to be on SGLT2i. As she is on insulin, will defer to PCP on this. Will readdress at future visits as well -on rosuvastatin 10 mg daily for diabetic dyslipidemia -per [facility], lipids 08/2019 show Tchol 234, HDL 47, LDL 52, TG 143 -would not add aspirin as she is on dabigatran  Strong family history of heart disease/heart failure: she is unclear of the etiology of this  Cardiac risk counseling and prevention recommendations: -recommend heart  healthy/Mediterranean diet, with whole grains, fruits, vegetable, fish, lean meats, nuts, and olive oil. Limit salt. -recommend moderate walking, 3-5 times/week for 30-50 minutes each session. Aim for at least 150 minutes.week. Goal should be pace of 3 miles/hours, or walking 1.5 miles in 30 minutes -recommend avoidance of tobacco products. Avoid excess alcohol. -Additional risk factor control:  -Weight: BMI 44. Would benefit from weight loss given morbid obesity with comorbidities  -OSA: diagnosed -ASCVD risk score: The  10-year ASCVD risk score Mikey Bussing DC Brooke Bonito., et al., 2013) is: 20.6%   Values used to calculate the score:     Age: 82 years     Sex: Female     Is Non-Hispanic African American: Yes     Diabetic: Yes     Tobacco smoker: No     Systolic Blood Pressure: 546 mmHg     Is BP treated: Yes     HDL Cholesterol: 47 mg/dL     Total Cholesterol: 234 mg/dL    Plan for follow up: cardioversion, then close post cardioversion follow up  Buford Dresser, MD, PhD [place]  [facility]    Medication Adjustments/Labs and Tests Ordered: Current medicines are reviewed at length with the patient today.  Concerns regarding medicines are outlined above.  Orders Placed This Encounter  Procedures  . CBC  . EKG 12-Lead   No orders of the defined types were placed in this encounter.   Patient Instructions  Medication Instructions:  Your Physician recommend you continue on your current medication as directed.    *If you need a refill on your cardiac medications before your next appointment, please call your pharmacy*   Lab Work: Your physician recommends that you return for lab work today ( CBC)  If you have labs (blood work) drawn today and your tests are completely normal, you will receive your results only by: Marland Kitchen MyChart Message (if you have MyChart) OR . A paper copy in the mail If you have any lab test that is abnormal or we need to change your treatment, we will call  you to review the results.   Testing/Procedures: [place] will need to have the coronavirus test completed prior to your procedure. An appointment has been made at 12:15 am on Monday 05/03/20. This is a Drive Up Visit at [address], [place], [place] [address]. Please tell them that you are there for procedure testing. Stay in your car and someone will be with you shortly. Please make sure to have all other labs completed before this test because you will need to stay quarantined until your procedure.    Follow-Up: At [facility], you and your health needs are our priority.  As part of our continuing mission to provide you with exceptional heart care, we have created designated Provider Care Teams.  These Care Teams include your primary Cardiologist (physician) and Advanced Practice Providers (APPs -  Physician Assistants and Nurse Practitioners) who all work together to provide you with the care you need, when you need it.  We recommend signing up for the patient portal called "MyChart".  Sign up information is provided on this After Visit Summary.  MyChart is used to connect with patients for Virtual Visits (Telemedicine).  Patients are able to view lab/test results, encounter notes, upcoming appointments, etc.  Non-urgent messages can be sent to your provider as well.   To learn more about what you can do with MyChart, go to [email or web address].    Your next appointment:   2 month(s)  The format for your next appointment:   In Person  Provider:   Buford Dresser, MD   You are scheduled for a Cardioversion on May 06, 2020 with Dr. Harrell Gave.  Please arrive at the [facility] (Main Entrance A) at [facility]: [address] [place], [place] [address] at 7:30 am   DIET: Nothing to eat or drink after midnight except a sip of water with  medications (see medication instructions below)  Medication Instructions: Take 1/2  Levemir dose the night before  The morning of procedure hold Levemir and Novolog  Continue your anticoagulant: Pradaxa  You will need to continue your anticoagulant after your procedure until you  are told by your  Provider that it is safe to stop   Labs: Today ( CBC)  You must have a responsible person to drive you home and stay in the waiting area during your procedure. Failure to do so could result in cancellation.  Bring your insurance cards.  *Special Note: Every effort is made to have your procedure done on time. Occasionally there are emergencies that occur at the hospital that may cause delays. Please be patient if a delay does occur.      Signed, Buford Dresser, MD PhD 04/30/2020    [place]

## 2020-04-30 NOTE — Patient Instructions (Signed)
Medication Instructions:  Your Physician recommend you continue on your current medication as directed.    *If you need a refill on your cardiac medications before your next appointment, please call your pharmacy*   Lab Work: Your physician recommends that you return for lab work today ( CBC)  If you have labs (blood work) drawn today and your tests are completely normal, you will receive your results only by: Marland Kitchen MyChart Message (if you have MyChart) OR . A paper copy in the mail If you have any lab test that is abnormal or we need to change your treatment, we will call you to review the results.   Testing/Procedures: [place] will need to have the coronavirus test completed prior to your procedure. An appointment has been made at 12:15 am on Monday 05/03/20. This is a Drive Up Visit at [address], [place], [place] [address]. Please tell them that you are there for procedure testing. Stay in your car and someone will be with you shortly. Please make sure to have all other labs completed before this test because you will need to stay quarantined until your procedure.    Follow-Up: At [facility], you and your health needs are our priority.  As part of our continuing mission to provide you with exceptional heart care, we have created designated Provider Care Teams.  These Care Teams include your primary Cardiologist (physician) and Advanced Practice Providers (APPs -  Physician Assistants and Nurse Practitioners) who all work together to provide you with the care you need, when you need it.  We recommend signing up for the patient portal called "MyChart".  Sign up information is provided on this After Visit Summary.  MyChart is used to connect with patients for Virtual Visits (Telemedicine).  Patients are able to view lab/test results, encounter notes, upcoming appointments, etc.  Non-urgent messages can be sent to your provider as well.   To learn  more about what you can do with MyChart, go to [email or web address].    Your next appointment:   2 month(s)  The format for your next appointment:   In Person  Provider:   Buford Dresser, MD   You are scheduled for a Cardioversion on May 06, 2020 with Dr. Harrell Gave.  Please arrive at the [facility] (Main Entrance A) at [facility]: [address] [place], [place] [address] at 7:30 am   DIET: Nothing to eat or drink after midnight except a sip of water with medications (see medication instructions below)  Medication Instructions: Take 1/2 Levemir dose the night before  The morning of procedure hold Levemir and Novolog  Continue your anticoagulant: Pradaxa  You will need to continue your anticoagulant after your procedure until you  are told by your  Provider that it is safe to stop   Labs: Today ( CBC)  You must have a responsible person to drive you home and stay in the waiting area during your procedure. Failure to do so could result in cancellation.  Bring your insurance cards.  *Special Note: Every effort is made to have your procedure done on time. Occasionally there are emergencies that occur at the hospital that may cause delays. Please be patient if a delay does occur.

## 2020-05-03 ENCOUNTER — Other Ambulatory Visit (HOSPITAL_COMMUNITY)
Admission: RE | Admit: 2020-05-03 | Discharge: 2020-05-03 | Disposition: A | Discharge status: Home / Self Care | Payer: Medicare HMO | Source: Ambulatory Visit | Attending: Cardiology | Admitting: Cardiology

## 2020-05-03 DIAGNOSIS — Z20822 Contact with and (suspected) exposure to covid-19: Secondary | ICD-10-CM | POA: Insufficient documentation

## 2020-05-03 DIAGNOSIS — Z01812 Encounter for preprocedural laboratory examination: Secondary | ICD-10-CM | POA: Diagnosis not present

## 2020-05-03 DIAGNOSIS — I480 Paroxysmal atrial fibrillation: Secondary | ICD-10-CM | POA: Diagnosis not present

## 2020-05-03 LAB — CBC
Hematocrit: 40.6 % (ref 34.0–46.6)
Hemoglobin: 13.4 g/dL (ref 11.1–15.9)
MCH: 28.5 pg (ref 26.6–33.0)
MCHC: 33 g/dL (ref 31.5–35.7)
MCV: 86 fL (ref 79–97)
Platelets: 236 10*3/uL (ref 150–450)
RBC: 4.7 x10E6/uL (ref 3.77–5.28)
RDW: 12.9 % (ref 11.7–15.4)
WBC: 7.7 10*3/uL (ref 3.4–10.8)

## 2020-05-03 LAB — SARS CORONAVIRUS 2 (TAT 6-24 HRS): SARS Coronavirus 2: NEGATIVE

## 2020-05-04 NOTE — Progress Notes (Signed)
S:     PCP: Juluis Mire, NP  PMH: atrial fibrillation, chronic systolic and diastolic heart failure, OSA, type II diabetes, CKD-3(BL Scr 1.6)  Patient arrives in good spirits,ambulating with a cane. Presents for diabetes evaluation, education, and management. Patient was referred and last seen by Primary Care Provider on 03/24/20.  Patient reports Diabetes was diagnosed ~7-8 years ago.   At last visit on 04/21/20 in pharmacy clinic, Levemir was increased from 40 units to 50 units BID, patient instructed to take Novolog 5 units TID w/meals as prescribed instead of BID, and to consider addition of SGLT2 inhibitor at next visit pending lab results from 04/21/20.  Today, patient reports medication adherence with Levemir 50 units BID, Novolog 5 units TID w/meals and Trulicity 1.5 mg SQ weekly on Wednesdays. Denies stomach upset, diarrhea, and NV. Did not bring glucometer however reports sugars have been 140, 150, 90s; denies any sugars > 200 and < 70. Denies symptomatic hypoglycemia. Reports not taking Levemir 50 units this morning and had a full cup of strawberry lemonade juice.  Family/Social History:  -Fhx: CHF (brother, both sisters, granddaughter), MI (mother, 1 sister passed away at age 29-40), HTN (sister, brother) - Tobacco: never smoker - Alcohol: denies use  Insurance coverage/medication affordability: [name] and [place] Medicaid  Medication adherence reported.   Current diabetes medications include:  - Trulicity 1.5 mg weekly (Wednesdays) - Novolog 5 units TID w/meals (refers to as "blue and orange" pen) - Levemir 50 units BID (refers to as "blue and green" pen)  Current hypertension medications include: Losartan 25 mg daily, Toprol XL 100 mg daily, Lasix 40 mg BID, Digoxin 125 mcg daily Current hyperlipidemia medications include: Rosuvastatin 10 mg daily  Patient denies hypoglycemic events.  Patient reported dietary habits: -Breakfast: bacon, eggs, toast,  watermelon, juice -Dinner: broccoli, mac and cheese, pork chops, lemonade -Snack: chicken salad   Patient denies nocturia (nighttime urination).  Patient reports neuropathy (nerve pain). Patient denies visual changes.   O:  POCT: 270 (post-prandial)  Home blood sugars:  140, 150, 90s  Lab Results  Component Value Date   HGBA1C 10.0 (H) 03/02/2020   There were no vitals filed for this visit.  Lipid Panel     Component Value Date/Time   CHOL 234 (H) 09/04/2019 1031   TRIG 143 09/04/2019 1031   HDL 47 09/04/2019 1031   CHOLHDL 5.0 (H) 09/04/2019 1031   CHOLHDL 3.1 12/30/2017 0436   VLDL 20 12/30/2017 0436   LDLCALC 161 (H) 09/04/2019 1031     Clinical Atherosclerotic Cardiovascular Disease (ASCVD): No  The 10-year ASCVD risk score Mikey Bussing DC Jr., et al., 2013) is: 20.6%   Values used to calculate the score:     Age: 18 years     Sex: Female     Is Non-Hispanic African American: Yes     Diabetic: Yes     Tobacco smoker: No     Systolic Blood Pressure: 144 mmHg     Is BP treated: Yes     HDL Cholesterol: 47 mg/dL     Total Cholesterol: 234 mg/dL     A/P: Diabetes longstanding currently uncontrolled with A1C 10% however home sugars have since improved. Post-prandial POCT BG was elevated at 270 mg/dL, however patient did not take Levemir 50 units prior to visit this morning and also had a large cup of strawberry lemonade juice. Medication adherence appears optimal. Will make no medication changes today. Can consider increasing Trulicity to 3 mg weekly  for additional DM management at next visit. Possible candidate for SGLT-2 inhibitor given history of heart failure and CKD, however most recent Scr increased from 1.5 to 1.9 (BL 1.6) with eGFR of 32 - consider rechecking BMET in 4-6 weeks.  -Continued Levemir 50 mg BID -Continued Novolog 5 units TID w/meals -Continued Trulicity 1.5 mg weekly on Wednesdays -Extensively discussed pathophysiology of diabetes, recommended lifestyle  interventions, dietary effects on blood sugar control -Counseled on s/sx of and management of hypoglycemia -Next A1C anticipated October 2021.     Additionally, patient has a cardioversion scheduled from tomorrow 05/06/20. Per Dr. Shawna Orleans note on 9/10, reiterated to patient to take 1/2 Levemir dose the night before (Levemir 25 units this evening) and hold Levemir and Novolog tomorrow morning of procedure (05/06/20). Also, instructed patient to ask cardiologist when to resume Levemir and Novolog after procedure. Patient verbalized understanding.  -Take Levemir 25 units this evening  -Hold Levemir and Novolog tomorrow morning (05/06/20)  Written patient instructions provided.  Total time in face to face counseling 30 minutes.   Follow up Pharmacist Clinic Visit in 2 weeks.     Lorel Monaco, PharmD PGY2 Ambulatory Care Resident [place]

## 2020-05-05 ENCOUNTER — Ambulatory Visit: Payer: Medicare HMO | Attending: Family Medicine | Admitting: Pharmacist

## 2020-05-05 ENCOUNTER — Encounter: Payer: Self-pay | Admitting: Pharmacist

## 2020-05-05 ENCOUNTER — Other Ambulatory Visit: Payer: Self-pay

## 2020-05-05 DIAGNOSIS — E669 Obesity, unspecified: Secondary | ICD-10-CM

## 2020-05-05 DIAGNOSIS — E1169 Type 2 diabetes mellitus with other specified complication: Secondary | ICD-10-CM | POA: Diagnosis not present

## 2020-05-05 LAB — GLUCOSE, POCT (MANUAL RESULT ENTRY): POC Glucose: 270 mg/dl — AB (ref 70–99)

## 2020-05-06 ENCOUNTER — Other Ambulatory Visit: Payer: Self-pay

## 2020-05-06 ENCOUNTER — Encounter (HOSPITAL_COMMUNITY): Payer: Self-pay | Admitting: Cardiology

## 2020-05-06 ENCOUNTER — Ambulatory Visit (HOSPITAL_COMMUNITY): Payer: Medicare HMO | Admitting: Anesthesiology

## 2020-05-06 ENCOUNTER — Encounter (HOSPITAL_COMMUNITY)
Admission: RE | Disposition: A | Discharge status: Home / Self Care | Payer: Self-pay | Source: Home / Self Care | Attending: Cardiology

## 2020-05-06 ENCOUNTER — Ambulatory Visit (HOSPITAL_COMMUNITY)
Admission: RE | Admit: 2020-05-06 | Discharge: 2020-05-06 | Disposition: A | Discharge status: Home / Self Care | Payer: Medicare HMO | Attending: Cardiology | Admitting: Cardiology

## 2020-05-06 ENCOUNTER — Encounter: Payer: Self-pay | Admitting: Cardiology

## 2020-05-06 DIAGNOSIS — I081 Rheumatic disorders of both mitral and tricuspid valves: Secondary | ICD-10-CM | POA: Insufficient documentation

## 2020-05-06 DIAGNOSIS — J449 Chronic obstructive pulmonary disease, unspecified: Secondary | ICD-10-CM | POA: Insufficient documentation

## 2020-05-06 DIAGNOSIS — Z90710 Acquired absence of both cervix and uterus: Secondary | ICD-10-CM | POA: Diagnosis not present

## 2020-05-06 DIAGNOSIS — Z882 Allergy status to sulfonamides status: Secondary | ICD-10-CM | POA: Insufficient documentation

## 2020-05-06 DIAGNOSIS — I4819 Other persistent atrial fibrillation: Secondary | ICD-10-CM

## 2020-05-06 DIAGNOSIS — Z86718 Personal history of other venous thrombosis and embolism: Secondary | ICD-10-CM | POA: Insufficient documentation

## 2020-05-06 DIAGNOSIS — Z79899 Other long term (current) drug therapy: Secondary | ICD-10-CM | POA: Insufficient documentation

## 2020-05-06 DIAGNOSIS — F329 Major depressive disorder, single episode, unspecified: Secondary | ICD-10-CM | POA: Diagnosis not present

## 2020-05-06 DIAGNOSIS — Z794 Long term (current) use of insulin: Secondary | ICD-10-CM | POA: Insufficient documentation

## 2020-05-06 DIAGNOSIS — I5042 Chronic combined systolic (congestive) and diastolic (congestive) heart failure: Secondary | ICD-10-CM | POA: Insufficient documentation

## 2020-05-06 DIAGNOSIS — I110 Hypertensive heart disease with heart failure: Secondary | ICD-10-CM | POA: Insufficient documentation

## 2020-05-06 DIAGNOSIS — E1169 Type 2 diabetes mellitus with other specified complication: Secondary | ICD-10-CM | POA: Diagnosis not present

## 2020-05-06 DIAGNOSIS — I428 Other cardiomyopathies: Secondary | ICD-10-CM | POA: Insufficient documentation

## 2020-05-06 DIAGNOSIS — Z8249 Family history of ischemic heart disease and other diseases of the circulatory system: Secondary | ICD-10-CM | POA: Insufficient documentation

## 2020-05-06 DIAGNOSIS — K219 Gastro-esophageal reflux disease without esophagitis: Secondary | ICD-10-CM | POA: Diagnosis not present

## 2020-05-06 DIAGNOSIS — I480 Paroxysmal atrial fibrillation: Secondary | ICD-10-CM | POA: Diagnosis not present

## 2020-05-06 DIAGNOSIS — E785 Hyperlipidemia, unspecified: Secondary | ICD-10-CM | POA: Diagnosis not present

## 2020-05-06 DIAGNOSIS — I5043 Acute on chronic combined systolic (congestive) and diastolic (congestive) heart failure: Secondary | ICD-10-CM | POA: Insufficient documentation

## 2020-05-06 DIAGNOSIS — I50813 Acute on chronic right heart failure: Secondary | ICD-10-CM | POA: Insufficient documentation

## 2020-05-06 DIAGNOSIS — I482 Chronic atrial fibrillation, unspecified: Secondary | ICD-10-CM | POA: Diagnosis not present

## 2020-05-06 DIAGNOSIS — Z6841 Body Mass Index (BMI) 40.0 and over, adult: Secondary | ICD-10-CM | POA: Diagnosis not present

## 2020-05-06 DIAGNOSIS — G4733 Obstructive sleep apnea (adult) (pediatric): Secondary | ICD-10-CM | POA: Insufficient documentation

## 2020-05-06 DIAGNOSIS — M1990 Unspecified osteoarthritis, unspecified site: Secondary | ICD-10-CM | POA: Diagnosis not present

## 2020-05-06 DIAGNOSIS — Z7901 Long term (current) use of anticoagulants: Secondary | ICD-10-CM | POA: Insufficient documentation

## 2020-05-06 HISTORY — PX: CARDIOVERSION: SHX1299

## 2020-05-06 LAB — POCT I-STAT, CHEM 8
BUN: 39 mg/dL — ABNORMAL HIGH (ref 8–23)
Calcium, Ion: 0.94 mmol/L — ABNORMAL LOW (ref 1.15–1.40)
Chloride: 94 mmol/L — ABNORMAL LOW (ref 98–111)
Creatinine, Ser: 2 mg/dL — ABNORMAL HIGH (ref 0.44–1.00)
Glucose, Bld: 203 mg/dL — ABNORMAL HIGH (ref 70–99)
HCT: 45 % (ref 36.0–46.0)
Hemoglobin: 15.3 g/dL — ABNORMAL HIGH (ref 12.0–15.0)
Potassium: 3.6 mmol/L (ref 3.5–5.1)
Sodium: 139 mmol/L (ref 135–145)
TCO2: 35 mmol/L — ABNORMAL HIGH (ref 22–32)

## 2020-05-06 SURGERY — CARDIOVERSION
Anesthesia: General

## 2020-05-06 MED ORDER — LIDOCAINE 2% (20 MG/ML) 5 ML SYRINGE
INTRAMUSCULAR | Status: DC | PRN
  Administered 2020-05-06: 20 mg via INTRAVENOUS

## 2020-05-06 MED ORDER — SODIUM CHLORIDE 0.9 % IV SOLN: INTRAVENOUS | Status: DC

## 2020-05-06 MED ORDER — PROPOFOL 10 MG/ML IV BOLUS
INTRAVENOUS | Status: DC | PRN
  Administered 2020-05-06: 80 mg via INTRAVENOUS

## 2020-05-06 NOTE — CV Procedure (Signed)
Procedure:   DCCV  Indication:  Symptomatic atrial fibrillation  Procedure Note:  The patient signed informed consent.  They have had had therapeutic anticoagulation with dabigatran greater than 3 weeks.  Anesthesia was administered by Dr. Glennon Mac.  Patient received 20 mg IV lidocaine and 80 mg IV propofol.Adequate airway was maintained throughout and vital followed per protocol.  They were cardioverted x 2 with 120, 150J of biphasic synchronized energy.  They converted to NSR with frequent PACs and brief atrial flutter.  There were no apparent complications.  The patient had normal neuro status and respiratory status post procedure with vitals stable as recorded elsewhere.    Follow up:  They will continue on current medical therapy and follow up with cardiology as scheduled.  Buford Dresser, MD PhD 05/06/2020 8:47 AM

## 2020-05-06 NOTE — Interval H&P Note (Signed)
History and Physical Interval Note:  05/06/2020 8:31 AM  [name]  has presented today for surgery, with the diagnosis of AFIB.  The various methods of treatment have been discussed with the patient and family. After consideration of risks, benefits and other options for treatment, the patient has consented to  Procedure(s): CARDIOVERSION (N/A) as a surgical intervention.  The patient's history has been reviewed, patient examined, no change in status, stable for surgery.  I have reviewed the patient's chart and labs.  Questions were answered to the patient's satisfaction.     [name]

## 2020-05-06 NOTE — Anesthesia Procedure Notes (Signed)
Procedure Name: General with mask airway Date/Time: 05/06/2020 8:33 AM Performed by: Leonor Liv, CRNA Oxygen Delivery Method: Ambu bag Dental Injury: Teeth and Oropharynx as per pre-operative assessment

## 2020-05-06 NOTE — Anesthesia Postprocedure Evaluation (Signed)
Anesthesia Post Note  Patient: [name]  Procedure(s) Performed: CARDIOVERSION (N/A )     Patient location during evaluation: Endoscopy Anesthesia Type: General Level of consciousness: awake and alert, patient cooperative and oriented Pain management: pain level controlled Vital Signs Assessment: post-procedure vital signs reviewed and stable Respiratory status: spontaneous breathing, nonlabored ventilation and respiratory function stable Cardiovascular status: blood pressure returned to baseline and stable Postop Assessment: no apparent nausea or vomiting Anesthetic complications: no   No complications documented.  Last Vitals:  Vitals:   05/06/20 0718 05/06/20 0849  BP: (!) 126/107 105/71  Pulse: (!) 101 82  Resp: 16 (!) 23  Temp: (!) 36 C   SpO2: 99% 100%    Last Pain:  Vitals:   05/06/20 0718  TempSrc: Temporal  PainSc: 0-No pain                 Destin Vinsant,E. Galya Dunnigan

## 2020-05-06 NOTE — Anesthesia Preprocedure Evaluation (Addendum)
Anesthesia Evaluation  Patient identified by MRN, date of birth, ID band Patient awake    Reviewed: Allergy & Precautions, NPO status , Patient's Chart, lab work & pertinent test results, reviewed documented beta blocker date and time   History of Anesthesia Complications Negative for: history of anesthetic complications  Airway Mallampati: II  TM Distance: >3 FB Neck ROM: Full    Dental  (+) Dental Advisory Given   Pulmonary shortness of breath, sleep apnea and Continuous Positive Airway Pressure Ventilation , COPD,  COPD inhaler,  05/03/2020 SARS coronavirus NEG   breath sounds clear to auscultation       Cardiovascular hypertension, Pt. on medications and Pt. on home beta blockers (-) angina+ CAD (mild non-obstructive) and + DVT (remote history)  + dysrhythmias Atrial Fibrillation  Rhythm:Irregular Rate:Normal  '19 cath: Prox LAD lesion is 10% stenosed.  Dist LM to Ost LAD lesion is 10% stenosed.  Ost Cx lesion is 15% stenosed.  The left ventricular systolic function is normal.  LV end diastolic pressure is normal.  '19 ECHO: 45% to 50%. There is moderate hypokinesis of the entire anteroseptal myocardium. Mild AI, mild mitral annular dilation with severe MR, LA severely dilated.  RV mildly dilated, with RV Systolic function was mildly to moderately reduced. RA severely dilated. Severe TR   Neuro/Psych  Headaches, Depression    GI/Hepatic Neg liver ROS, GERD  Controlled,  Endo/Other  diabetes, Insulin DependentMorbid obesity  Renal/GU Renal InsufficiencyRenal disease     Musculoskeletal   Abdominal (+) + obese,   Peds  Hematology pradaxa   Anesthesia Other Findings   Reproductive/Obstetrics                            Anesthesia Physical Anesthesia Plan  ASA: III  Anesthesia Plan: General   Post-op Pain Management:    Induction:   PONV Risk Score and Plan: 3 and Treatment may  vary due to age or medical condition  Airway Management Planned: Natural Airway and Mask  Additional Equipment: None  Intra-op Plan:   Post-operative Plan:   Informed Consent: I have reviewed the patients History and Physical, chart, labs and discussed the procedure including the risks, benefits and alternatives for the proposed anesthesia with the patient or authorized representative who has indicated his/her understanding and acceptance.     Dental advisory given  Plan Discussed with: CRNA and Surgeon  Anesthesia Plan Comments:        Anesthesia Quick Evaluation

## 2020-05-06 NOTE — Transfer of Care (Signed)
Immediate Anesthesia Transfer of Care Note  Patient: [name]  Procedure(s) Performed: CARDIOVERSION (N/A )  Patient Location: Endoscopy Unit  Anesthesia Type:General  Level of Consciousness: drowsy  Airway & Oxygen Therapy: Patient Spontanous Breathing and Patient connected to nasal cannula oxygen  Post-op Assessment: Report given to RN and Post -op Vital signs reviewed and stable  Post vital signs: Reviewed and stable  Last Vitals:  Vitals Value Taken Time  BP    Temp    Pulse    Resp    SpO2      Last Pain:  Vitals:   05/06/20 0718  TempSrc: Temporal  PainSc: 0-No pain         Complications: No complications documented.

## 2020-05-06 NOTE — Discharge Instructions (Signed)
Electrical Cardioversion Electrical cardioversion is the delivery of a jolt of electricity to restore a normal rhythm to the heart. A rhythm that is too fast or is not regular keeps the heart from pumping well. In this procedure, sticky patches or metal paddles are placed on the chest to deliver electricity to the heart from a device. This procedure may be done in an emergency if:  There is low or no blood pressure as a result of the heart rhythm.  Normal rhythm must be restored as fast as possible to protect the brain and heart from further damage.  It may save a life. This may also be a scheduled procedure for irregular or fast heart rhythms that are not immediately life-threatening. The procedure may vary among health care providers and hospitals. What can I expect after the procedure?  Your blood pressure, heart rate, breathing rate, and blood oxygen level will be monitored until you leave the hospital or clinic.  Your heart rhythm will be watched to make sure it does not change.  You may have some redness on the skin where the shocks were given. Follow these instructions at home:  Do not drive for 24 hours if you were given a sedative during your procedure.  Take over-the-counter and prescription medicines only as told by your health care provider.  Ask your health care provider how to check your pulse. Check it often.  Rest for 48 hours after the procedure or as told by your health care provider.  Avoid or limit your caffeine use as told by your health care provider.  Keep all follow-up visits as told by your health care provider. This is important. Contact a health care provider if:  You feel like your heart is beating too quickly or your pulse is not regular.  You have a serious muscle cramp that does not go away. Get help right away if:  You have discomfort in your chest.  You are dizzy or you feel faint.  You have trouble breathing or you are short of  breath.  Your speech is slurred.  You have trouble moving an arm or leg on one side of your body.  Your fingers or toes turn cold or blue. Summary  Electrical cardioversion is the delivery of a jolt of electricity to restore a normal rhythm to the heart.  This procedure may be done right away in an emergency or may be a scheduled procedure if the condition is not an emergency.  Generally, this is a safe procedure.  After the procedure, check your pulse often as told by your health care provider. This information is not intended to replace advice given to you by your health care provider. Make sure you discuss any questions you have with your health care provider. Document Revised: 03/10/2019 Document Reviewed: 03/10/2019 Elsevier Patient Education  [employer].

## 2020-05-07 ENCOUNTER — Encounter (HOSPITAL_COMMUNITY): Payer: Self-pay | Admitting: Cardiology

## 2020-05-17 NOTE — Progress Notes (Unsigned)
  S:     PCP: Michelle Edwards, NP  PMH: atrial fibrillation, chronic systolic and diastolic heart failure, OSA, type II diabetes, CKD-3(BL Scr 1.6)  Patient arrives in good spirits,ambulating with a cane. Presents for diabetes management. Patient was referred and last seen by Primary Care  on 03/24/20. At last visit on 05/05/20 in pharmacy clinic, no medication changes were made as home sugars have improved.   Patient reports Diabetes was diagnosed ~7-8 years ago.   Today, patient reports ***  What she did after her procedure with insulin??  A1C = Check Clinic BG Review medications and adherence (timing of meds, etc.)  Ate or drank anything prior to visit today? At home BGs?  . Highs . Lows  Hyperglycemia sx (nocturia, neuropathy, visual changes, foot exams) Hypoglycemia symptoms (dizziness, shaky, sweating, hungry, confusion) Diet - eating okay? Exercise  Plan -titrate insulin if needed -Possible candidate for SGLT-2 inhibitor given history of heart failure and CKD, however most recent Scr increased from 1.5 >> 1.9 >> 2 (BL 1.6) with eGFR of ~32 - consider rechecking BMET in 4-6 weeks  Family/Social History:  -Fhx: CHF (brother, both sisters, granddaughter), MI (mother, 1 sister passed away at age 38-40), HTN (sister, brother) - Tobacco: never smoker - Alcohol: denies use  Insurance coverage/medication affordability: [name] and  Medicaid  Medication adherence reported *** Current diabetes medications include: -Trulicity 1.5 mg weekly (Wednesdays) -Novolog 5 units TID w/meals(refers to as "blue and orange" pen) -Levemir 50 units BID(refers to as "blue and green" pen)  Current hypertension medications include: Losartan 25 mg daily, Toprol XL 100 mg daily, Lasix 40 mg BID, Digoxin 125 mcg daily Current hyperlipidemia medications include: Rosuvastatin 10 mg daily  Patient {Actions; denies-reports:120008} hypoglycemic events.  Patient  reported dietary habits: -Breakfast: bacon, eggs, toast, watermelon, juice -Dinner: broccoli, mac and cheese, pork chops, lemonade -Snack: chicken salad   Patient {Actions; denies-reports:120008} nocturia (nighttime urination).  Patient {Actions; denies-reports:120008} neuropathy (nerve pain). Patient {Actions; denies-reports:120008} visual changes. Patient {Actions; denies-reports:120008} self foot exams.     O:  POCT:  Home blood sugars:    Lab Results  Component Value Date   HGBA1C 10.0 (H) 03/02/2020   There were no vitals filed for this visit.  Lipid Panel     Component Value Date/Time   CHOL 234 (H) 09/04/2019 1031   TRIG 143 09/04/2019 1031   HDL 47 09/04/2019 1031   CHOLHDL 5.0 (H) 09/04/2019 1031   CHOLHDL 3.1 12/30/2017 0436   VLDL 20 12/30/2017 0436   LDLCALC 161 (H) 09/04/2019 1031    Clinical Atherosclerotic Cardiovascular Disease (ASCVD): No  The 10-year ASCVD risk score (Goff DC Jr., et al., 2013) is: 22.6%   Values used to calculate the score:     Age: 63 years     Sex: Female     Is Non-Hispanic African American: Yes     Diabetic: Yes     Tobacco smoker: No     Systolic Blood Pressure: 129 mmHg     Is BP treated: Yes     HDL Cholesterol: 47 mg/dL     Total Cholesterol: 234 mg/dL    A/P: Diabetes longstanding*** currently ***. Patient is *** able to verbalize appropriate hypoglycemia management plan. Medication adherence appears ***. Control is suboptimal due to ***. - -Extensively discussed pathophysiology of diabetes, recommended lifestyle interventions, dietary effects on blood sugar control -Counseled on s/sx of and management of hypoglycemia -Next A1C anticipated October 2021.   Written patient instructions   provided.  Total time in face to face counseling *** minutes.   Follow up Pharmacist/PCP*** Clinic Visit in ***.     Lorel Monaco, PharmD PGY2 Ambulatory Care Resident [place]

## 2020-05-19 ENCOUNTER — Ambulatory Visit: Payer: Medicare HMO | Admitting: Pharmacist

## 2020-05-25 DIAGNOSIS — S82891A Other fracture of right lower leg, initial encounter for closed fracture: Secondary | ICD-10-CM | POA: Diagnosis not present

## 2020-06-10 DIAGNOSIS — G4733 Obstructive sleep apnea (adult) (pediatric): Secondary | ICD-10-CM | POA: Diagnosis not present

## 2020-06-24 ENCOUNTER — Other Ambulatory Visit: Payer: Self-pay

## 2020-06-24 ENCOUNTER — Encounter (INDEPENDENT_AMBULATORY_CARE_PROVIDER_SITE_OTHER): Payer: Self-pay | Admitting: Primary Care

## 2020-06-24 ENCOUNTER — Ambulatory Visit (INDEPENDENT_AMBULATORY_CARE_PROVIDER_SITE_OTHER): Payer: Medicare HMO | Admitting: Primary Care

## 2020-06-24 VITALS — BP 123/79 | HR 65 | Temp 97.3°F | Ht 67.0 in | Wt 282.0 lb

## 2020-06-24 DIAGNOSIS — E119 Type 2 diabetes mellitus without complications: Secondary | ICD-10-CM

## 2020-06-24 DIAGNOSIS — I482 Chronic atrial fibrillation, unspecified: Secondary | ICD-10-CM | POA: Diagnosis not present

## 2020-06-24 DIAGNOSIS — F32A Depression, unspecified: Secondary | ICD-10-CM

## 2020-06-24 DIAGNOSIS — Z23 Encounter for immunization: Secondary | ICD-10-CM

## 2020-06-24 DIAGNOSIS — Z760 Encounter for issue of repeat prescription: Secondary | ICD-10-CM

## 2020-06-24 DIAGNOSIS — E785 Hyperlipidemia, unspecified: Secondary | ICD-10-CM | POA: Diagnosis not present

## 2020-06-24 LAB — POCT GLYCOSYLATED HEMOGLOBIN (HGB A1C): Hemoglobin A1C: 9.8 % — AB (ref 4.0–5.6)

## 2020-06-24 LAB — GLUCOSE, POCT (MANUAL RESULT ENTRY): POC Glucose: 254 mg/dl — AB (ref 70–99)

## 2020-06-24 MED ORDER — DULOXETINE HCL 60 MG PO CPEP: 60.0000 mg | ORAL_CAPSULE | Freq: Every day | ORAL | 3 refills | Status: DC

## 2020-06-24 MED ORDER — PREGABALIN 100 MG PO CAPS: 100.0000 mg | ORAL_CAPSULE | Freq: Three times a day (TID) | ORAL | 3 refills | Status: DC

## 2020-06-24 MED ORDER — TRULICITY 1.5 MG/0.5ML ~~LOC~~ SOAJ: 1.5000 mg | SUBCUTANEOUS | 3 refills | Status: DC

## 2020-06-24 MED ORDER — TRULICITY 1.5 MG/0.5ML ~~LOC~~ SOAJ
1.5000 mg | SUBCUTANEOUS | Status: DC
Start: 2020-06-24 — End: 2020-06-24

## 2020-06-24 MED ORDER — INSULIN ASPART 100 UNIT/ML ~~LOC~~ SOLN: 12.0000 [IU] | Freq: Three times a day (TID) | SUBCUTANEOUS | 3 refills | Status: DC

## 2020-06-24 NOTE — Progress Notes (Signed)
Established Patient Office Visit  Subjective:  Patient ID: [name], female    DOB: 10-05-1955  Age: 64 y.o. MRN: [id number]  CC:  Chief Complaint  Patient presents with  . Diabetes    HPI [name] is a 64 year old obese female who presents for management of type 2 diabetes she denies polyuria, polyphagia and polydipsia. AFib is managed cardiology.   Past Medical History:  Diagnosis Date  . Allergic rhinitis   . Arthritis   . Asthma   . Chest pain 12/27/2017  . Chronic diastolic CHF (congestive heart failure) ([place])   . COPD (chronic obstructive pulmonary disease) ([place])   . Depression   . DM (diabetes mellitus) ([place])   . DVT (deep vein thrombosis) in pregnancy   . Dysrhythmia    atrial fibrilation  . GERD (gastroesophageal reflux disease)   . Headache(784.0)   . HTN (hypertension)   . Hyperlipidemia   . Obesity   . Pneumonia 04/2018   RIGHT LOBE  . Shortness of breath   . Sleep apnea    compliant with CPAP    Past Surgical History:  Procedure Laterality Date  . ABDOMINAL HYSTERECTOMY    . CARDIOVERSION N/A 05/06/2020   Procedure: CARDIOVERSION;  Surgeon: Buford Dresser, MD;  Location: [facility] ENDOSCOPY;  Service: Cardiovascular;  Laterality: N/A;  . CATARACT EXTRACTION, BILATERAL    . CHOLECYSTECTOMY    . COLONOSCOPY WITH PROPOFOL N/A 03/05/2015   Procedure: COLONOSCOPY WITH PROPOFOL;  Surgeon: Carol Ada, MD;  Location: [facility] ENDOSCOPY;  Service: Endoscopy;  Laterality: N/A;  . DIAGNOSTIC LAPAROSCOPY    . ingrown hallux Left   . KNEE SURGERY    . LEFT HEART CATH AND CORONARY ANGIOGRAPHY N/A 12/31/2017   Procedure: LEFT HEART CATH AND CORONARY ANGIOGRAPHY;  Surgeon: Charolette Forward, MD;  Location: [facility];  Service: Cardiovascular;  Laterality: N/A;    Family History  Problem Relation Age of Onset  . Cancer Mother   . Hypertension Mother   . Cancer Father   . CAD Other   . Hypertension Sister   . Hypertension Brother      Social History   Socioeconomic History  . Marital status: Widowed    Spouse name: Not on file  . Number of children: Not on file  . Years of education: Not on file  . Highest education level: Not on file  Occupational History  . Not on file  Tobacco Use  . Smoking status: Never Smoker  . Smokeless tobacco: Never Used  Vaping Use  . Vaping Use: Never used  Substance and Sexual Activity  . Alcohol use: No  . Drug use: No  . Sexual activity: Not on file  Other Topics Concern  . Not on file  Social History Narrative   Pt lives in [place] with spouse.  2 grown children.   Previously worked in [employer] at [employer].  Now on disability   Social Determinants of Health   Financial Resource Strain:   . Difficulty of Paying Living Expenses: Not on file  Food Insecurity:   . Worried About Charity fundraiser in the Last Year: Not on file  . Ran Out of Food in the Last Year: Not on file  Transportation Needs:   . Lack of Transportation (Medical): Not on file  . Lack of Transportation (Non-Medical): Not on file  Physical Activity:   . Days of Exercise per Week: Not on file  . Minutes of Exercise per Session:  Not on file  Stress:   . Feeling of Stress : Not on file  Social Connections:   . Frequency of Communication with Friends and Family: Not on file  . Frequency of Social Gatherings with Friends and Family: Not on file  . Attends Religious Services: Not on file  . Active Member of Clubs or Organizations: Not on file  . Attends Archivist Meetings: Not on file  . Marital Status: Not on file  Intimate Partner Violence:   . Fear of Current or Ex-Partner: Not on file  . Emotionally Abused: Not on file  . Physically Abused: Not on file  . Sexually Abused: Not on file    Outpatient Medications Prior to Visit  Medication Sig Dispense Refill  . Accu-Chek Softclix Lancets lancets Use as instructed to check blood sugar three times daily. E11.69 100 each 12  .  albuterol (VENTOLIN HFA) 108 (90 Base) MCG/ACT inhaler Inhale 2 puffs into the lungs every 4 (four) hours as needed for wheezing or shortness of breath.     . Alcohol Swabs (B-D SINGLE USE SWABS REGULAR) PADS     . Blood Glucose Monitoring Suppl (ACCU-CHEK GUIDE ME) w/Device KIT Use as instructed to check blood sugar three times daily. E11.69 1 kit 0  . dabigatran (PRADAXA) 150 MG CAPS capsule Take 1 capsule (150 mg total) by mouth 2 (two) times daily. 60 capsule 2  . digoxin (LANOXIN) 0.125 MG tablet Take 0.125 mg by mouth daily.     . furosemide (LASIX) 40 MG tablet Take 1 tablet (40 mg total) by mouth 2 (two) times daily. 180 tablet 3  . glucose blood (ACCU-CHEK GUIDE) test strip Use as instructed to check blood sugar three times daily. E11.69 100 each 12  . Insulin Syringe-Needle U-100 (TRUEPLUS INSULIN SYRINGE) 31G X 5/16" 0.3 ML MISC Use to administer insulin every day. 100 each 11  . metolazone (ZAROXOLYN) 5 MG tablet Take 1 tablet (5 mg total) by mouth daily. 90 tablet 3  . metoprolol succinate (TOPROL-XL) 100 MG 24 hr tablet Take 1 tablet (100 mg total) by mouth daily. Take with or immediately following a meal. 90 tablet 3  . Multiple Vitamins-Minerals (MULTIVITAMIN WOMEN PO) Take 1 tablet by mouth daily.     . nitroGLYCERIN (NITROSTAT) 0.4 MG SL tablet Place 1 tablet (0.4 mg total) under the tongue every 5 (five) minutes x 3 doses as needed for chest pain. 25 tablet 12  . pantoprazole (PROTONIX) 40 MG tablet Take 1 tablet (40 mg total) by mouth 2 (two) times daily. 180 tablet 1  . RESTASIS 0.05 % ophthalmic emulsion Place 1 drop into both eyes 2 (two) times daily.  12  . rosuvastatin (CRESTOR) 10 MG tablet Take 1 tablet (10 mg total) by mouth daily. 90 tablet 1  . Dulaglutide (TRULICITY) 1.5 WF/0.9NA SOPN Inject 0.5 mLs (1.5 mg total) into the skin once a week. 12 pen 2  . DULoxetine (CYMBALTA) 60 MG capsule Take 1 capsule (60 mg total) by mouth daily. 30 capsule 3  . gabapentin  (NEURONTIN) 300 MG capsule Take 2 capsules in the morning and at bedtime (Patient taking differently: Take 300 mg by mouth at bedtime. ) 120 capsule 3  . insulin aspart (NOVOLOG) 100 UNIT/ML injection Inject 5 Units into the skin 3 (three) times daily with meals. 10 mL 3  . insulin detemir (LEVEMIR) 100 UNIT/ML injection INJECT 0.5 MLS (50 UNITS TOTAL) INTO THE SKIN 2 (TWO) TIMES DAILY. (Patient taking differently:  Inject 40 Units into the skin 2 (two) times daily. INJECT 0.5 MLS (50 UNITS TOTAL) INTO THE SKIN 2 (TWO) TIMES DAILY) 60 mL 1  . ondansetron (ZOFRAN) 4 MG tablet Take 1 tablet (4 mg total) by mouth every 6 (six) hours as needed for nausea. 30 tablet 1  . traMADol (ULTRAM) 50 MG tablet Take 1 tablet (50 mg total) by mouth every 8 (eight) hours as needed. (Patient taking differently: Take 50 mg by mouth every 8 (eight) hours as needed (pain). ) 60 tablet 0   No facility-administered medications prior to visit.    Allergies  Allergen Reactions  . Sulfa Antibiotics Itching    ROS Review of Systems  Musculoskeletal: Positive for arthralgias.  Neurological: Positive for dizziness and light-headedness.  All other systems reviewed and are negative.     Objective:    Physical Exam Vitals reviewed.  Constitutional:      Appearance: Normal appearance. She is obese.  HENT:     Head: Normocephalic.     Right Ear: Tympanic membrane normal.     Left Ear: Tympanic membrane normal.     Nose: Nose normal.  Eyes:     Extraocular Movements: Extraocular movements intact.  Cardiovascular:     Rate and Rhythm: Rhythm irregular.  Pulmonary:     Effort: Pulmonary effort is normal.     Breath sounds: Normal breath sounds.  Abdominal:     General: Bowel sounds are normal. There is distension.     Palpations: Abdomen is soft.  Musculoskeletal:        General: Normal range of motion.     Cervical back: Normal range of motion and neck supple.  Skin:    General: Skin is warm and dry.   Neurological:     Mental Status: She is alert and oriented to person, place, and time.  Psychiatric:        Mood and Affect: Mood normal.        Behavior: Behavior normal.        Thought Content: Thought content normal.        Judgment: Judgment normal.     BP 123/79 (BP Location: Left Wrist, Patient Position: Sitting, Cuff Size: Normal)   Pulse 65   Temp (!) 97.3 F (36.3 C) (Temporal)   Ht 5' 7" (1.702 m)   Wt 282 lb (127.9 kg)   SpO2 96%   BMI 44.17 kg/m  Wt Readings from Last 3 Encounters:  06/24/20 282 lb (127.9 kg)  04/30/20 267 lb 6.4 oz (121.3 kg)  03/31/20 285 lb 12.8 oz (129.6 kg)     Health Maintenance Due  Topic Date Due  . OPHTHALMOLOGY EXAM  Never done  . PAP SMEAR-Modifier  Never done  . MAMMOGRAM  Never done    There are no preventive care reminders to display for this patient.  Lab Results  Component Value Date   TSH 2.820 12/23/2019   Lab Results  Component Value Date   WBC 7.7 05/03/2020   HGB 15.3 (H) 05/06/2020   HCT 45.0 05/06/2020   MCV 86 05/03/2020   PLT 236 05/03/2020   Lab Results  Component Value Date   NA 139 05/06/2020   K 3.6 05/06/2020   CO2 29 04/21/2020   GLUCOSE 203 (H) 05/06/2020   BUN 39 (H) 05/06/2020   CREATININE 2.00 (H) 05/06/2020   BILITOT 0.3 04/21/2020   ALKPHOS 123 (H) 04/21/2020   AST 20 04/21/2020   ALT 17 04/21/2020  PROT 8.0 04/21/2020   ALBUMIN 4.0 04/21/2020   CALCIUM 9.7 04/21/2020   ANIONGAP 6 03/08/2020   Lab Results  Component Value Date   CHOL 140 06/24/2020   Lab Results  Component Value Date   HDL 46 06/24/2020   Lab Results  Component Value Date   LDLCALC 75 06/24/2020   Lab Results  Component Value Date   TRIG 102 06/24/2020   Lab Results  Component Value Date   CHOLHDL 3.0 06/24/2020   Lab Results  Component Value Date   HGBA1C 9.8 (A) 06/24/2020      Assessment & Plan:  [name] was seen today for diabetes.  Diagnoses and all orders for this visit:  Type 2  diabetes mellitus without complication, : Goal of therapy: Less than 6.5 hemoglobin A1c.   Continue to foods that are high in carbohydrates are the following rice, potatoes, breads, sugars, and pastas.  Reduction in the intake (eating) will assist in lowering your blood sugars. Uncontrolled. Medication regiment follow by Clinical pharmacist  Current diabetes medications include: -Trulicity 1.5 mg weekly (Wednesdays) -Novolog 5 units TID w/meals(refers to as "blue and orange" pen) -Levemir 50 units BID(refers to as "blue and green" pen. Likely not adherent to medication regiment  -     HgB A1c 9.8 -     Glucose (CBG)  Chronic atrial fibrillation (HCC) Followed by cardiology Dr. Buford Dresser   Hyperlipidemia, unspecified hyperlipidemia type Continue to decrease your fatty foods, red meat, cheese, milk and increase fiber like whole grains and veggies. Lipids are unremarkable per ADA guideline statin is preferred continue atorvastatin 10 mg at bedtime   Depression, unspecified depression type PHQ 3 manage by Cymbalta 60 mg daily   Need for immunization against influenza -     Flu Vaccine QUAD 36+ mos IM  Medication refill -     DULoxetine (CYMBALTA) 60 MG capsule; Take 1 capsule (60 mg total) by mouth daily. -     insulin aspart (NOVOLOG) 100 UNIT/ML injection; Inject 12 Units into the skin 3 (three) times daily before meals. For blood sugars 0-199 give 0 units of insulin, 201-250 give 4 units, 251-300 give 6 units, 301-350 give 8 units, 351-400 give 10 units,> 400 give 12 units and call M.D. -     pregabalin (LYRICA) 100 MG capsule; Take 1 capsule (100 mg total) by mouth 3 (three) times daily. -     Dulaglutide (TRULICITY) 1.5 HL/4.5GY SOPN; Inject 1.5 mg into the skin once a week. -     EPINEPHrine 0.3 mg/0.3 mL IJ SOAJ injection; Inject 0.3 mg into the muscle as needed for anaphylaxis.  Other orders -     Discontinue: Dulaglutide (TRULICITY) 1.5 BW/3.8LH SOPN; Inject 1.5  mg into the skin once a week. -     Discontinue: Dulaglutide (TRULICITY) 1.5 TD/4.2AJ SOPN; Inject 1.5 mg into the skin once a week.    Follow-up: Return in about 3 months (around 09/24/2020) for DM.    [name], NP

## 2020-06-24 NOTE — Patient Instructions (Signed)
Influenza, Adult Influenza is also called "the flu." It is an infection in the lungs, nose, and throat (respiratory tract). It is caused by a virus. The flu causes symptoms that are similar to symptoms of a cold. It also causes a high fever and body aches. The flu spreads easily from person to person (is contagious). Getting a flu shot (influenza vaccination) every year is the best way to prevent the flu. What are the causes? This condition is caused by the influenza virus. You can get the virus by:  Breathing in droplets that are in the air from the cough or sneeze of a person who has the virus.  Touching something that has the virus on it (is contaminated) and then touching your mouth, nose, or eyes. What increases the risk? Certain things may make you more likely to get the flu. These include:  Not washing your hands often.  Having close contact with many people during cold and flu season.  Touching your mouth, eyes, or nose without first washing your hands.  Not getting a flu shot every year. You may have a higher risk for the flu, along with serious problems such as a lung infection (pneumonia), if you:  Are older than 65.  Are pregnant.  Have a weakened disease-fighting system (immune system) because of a disease or taking certain medicines.  Have a long-term (chronic) illness, such as: ? Heart, kidney, or lung disease. ? Diabetes. ? Asthma.  Have a liver disorder.  Are very overweight (morbidly obese).  Have anemia. This is a condition that affects your red blood cells. What are the signs or symptoms? Symptoms usually begin suddenly and last 4-14 days. They may include:  Fever and chills.  Headaches, body aches, or muscle aches.  Sore throat.  Cough.  Runny or stuffy (congested) nose.  Chest discomfort.  Not wanting to eat as much as normal (poor appetite).  Weakness or feeling tired (fatigue).  Dizziness.  Feeling sick to your stomach (nauseous) or  throwing up (vomiting). How is this treated? If the flu is found early, you can be treated with medicine that can help reduce how bad the illness is and how long it lasts (antiviral medicine). This may be given by mouth (orally) or through an IV tube. Taking care of yourself at home can help your symptoms get better. Your doctor may suggest:  Taking over-the-counter medicines.  Drinking plenty of fluids. The flu often goes away on its own. If you have very bad symptoms or other problems, you may be treated in a hospital. Follow these instructions at home:     Activity  Rest as needed. Get plenty of sleep.  Stay home from work or school as told by your doctor. ? Do not leave home until you do not have a fever for 24 hours without taking medicine. ? Leave home only to visit your doctor. Eating and drinking  Take an ORS (oral rehydration solution). This is a drink that is sold at pharmacies and stores.  Drink enough fluid to keep your pee (urine) pale yellow.  Drink clear fluids in small amounts as you are able. Clear fluids include: ? Water. ? Ice chips. ? Fruit juice that has water added (diluted fruit juice). ? Low-calorie sports drinks.  Eat bland, easy-to-digest foods in small amounts as you are able. These foods include: ? Bananas. ? Applesauce. ? Rice. ? Lean meats. ? Toast. ? Crackers.  Do not eat or drink: ? Fluids that have a lot   of sugar or caffeine. ? Alcohol. ? Spicy or fatty foods. General instructions  Take over-the-counter and prescription medicines only as told by your doctor.  Use a cool mist humidifier to add moisture to the air in your home. This can make it easier for you to breathe.  Cover your mouth and nose when you cough or sneeze.  Wash your hands with soap and water often, especially after you cough or sneeze. If you cannot use soap and water, use alcohol-based hand sanitizer.  Keep all follow-up visits as told by your doctor. This is  important. How is this prevented?   Get a flu shot every year. You may get the flu shot in late summer, fall, or winter. Ask your doctor when you should get your flu shot.  Avoid contact with people who are sick during fall and winter (cold and flu season). Contact a doctor if:  You get new symptoms.  You have: ? Chest pain. ? Watery poop (diarrhea). ? A fever.  Your cough gets worse.  You start to have more mucus.  You feel sick to your stomach.  You throw up. Get help right away if you:  Have shortness of breath.  Have trouble breathing.  Have skin or nails that turn a bluish color.  Have very bad pain or stiffness in your neck.  Get a sudden headache.  Get sudden pain in your face or ear.  Cannot eat or drink without throwing up. Summary  Influenza ("the flu") is an infection in the lungs, nose, and throat. It is caused by a virus.  Take over-the-counter and prescription medicines only as told by your doctor.  Getting a flu shot every year is the best way to avoid getting the flu. This information is not intended to replace advice given to you by your health care provider. Make sure you discuss any questions you have with your health care provider. Document Revised: 01/23/2018 Document Reviewed: 01/23/2018 Elsevier Patient Education  2020 Elsevier Inc.  

## 2020-06-24 NOTE — Progress Notes (Signed)
Pt has chronic pain in knees, legs, feet and hands

## 2020-06-25 ENCOUNTER — Ambulatory Visit (INDEPENDENT_AMBULATORY_CARE_PROVIDER_SITE_OTHER): Payer: Self-pay

## 2020-06-25 DIAGNOSIS — S82891A Other fracture of right lower leg, initial encounter for closed fracture: Secondary | ICD-10-CM | POA: Diagnosis not present

## 2020-06-25 LAB — LIPID PANEL
Chol/HDL Ratio: 3 ratio (ref 0.0–4.4)
Cholesterol, Total: 140 mg/dL (ref 100–199)
HDL: 46 mg/dL (ref >39)
LDL Chol Calc (NIH): 75 mg/dL (ref 0–99)
Triglycerides: 102 mg/dL (ref 0–149)
VLDL Cholesterol Cal: 19 mg/dL (ref 5–40)

## 2020-06-25 MED ORDER — EPINEPHRINE 0.3 MG/0.3ML IJ SOAJ: 0.3000 mg | INTRAMUSCULAR | 1 refills | Status: AC | PRN

## 2020-06-25 NOTE — Telephone Encounter (Signed)
  Reason for Disposition . Medicine patch causing local rash or itching  Protocols used: MEDICATION QUESTION CALL-A-AH

## 2020-06-25 NOTE — Telephone Encounter (Signed)
Patient called and says she took Lyrica last night, a new prescription, only 1 pill and she started itching all over. She says she took Benadryl 25 mg around 0200, but the itching did not stop. She denies swelling, difficulty swallowing, difficulty breathing, no rash. She says she itches from her scalp all over the body. I asked about her throat, if it feels like it's closing up, patient denies but says her throat is itching as well, but she can swallow fine. I called the office and spoke with Peter Congo, [facility] to ask if someone could talk to the patient, because she was not able to do a virtual visit today. [name], NP came on the phone and asked me about her symptoms, I advised as above. [name] says to tell the patient to take Benadryl 25 mg every 6 hours nonstop for itching, she will send in an Epipen, advise to call 911 if difficulty swallowing, difficulty breathing. I advised the patient, she verbalized understanding and says to thank [facility] for everything.   Answer Assessment - Initial Assessment Questions 1. NAME of MEDICATION: "What medicine are you calling about?"     Lyrica 2. QUESTION: "What is your question?" (e.g., medication refill, side effect)     Side effects, itching all over 3. PRESCRIBING HCP: "Who prescribed it?" Reason: if prescribed by specialist, call should be referred to that group.     Juluis Mire 4. SYMPTOMS: "Do you have any symptoms?"     Itching all over body 5. SEVERITY: If symptoms are present, ask "Are they mild, moderate or severe?"     Severe 6. PREGNANCY:  "Is there any chance that you are pregnant?" "When was your last menstrual period?"     No  Protocols used: MEDICATION QUESTION CALL-A-AH

## 2020-06-29 ENCOUNTER — Other Ambulatory Visit (INDEPENDENT_AMBULATORY_CARE_PROVIDER_SITE_OTHER): Payer: Self-pay | Admitting: Primary Care

## 2020-06-29 DIAGNOSIS — E1169 Type 2 diabetes mellitus with other specified complication: Secondary | ICD-10-CM

## 2020-06-29 DIAGNOSIS — Z760 Encounter for issue of repeat prescription: Secondary | ICD-10-CM

## 2020-06-29 DIAGNOSIS — E669 Obesity, unspecified: Secondary | ICD-10-CM

## 2020-06-29 MED ORDER — ROSUVASTATIN CALCIUM 10 MG PO TABS: 10.0000 mg | ORAL_TABLET | Freq: Every day | ORAL | 1 refills | Status: DC

## 2020-07-05 ENCOUNTER — Encounter: Payer: Self-pay | Admitting: Cardiology

## 2020-07-05 ENCOUNTER — Ambulatory Visit (INDEPENDENT_AMBULATORY_CARE_PROVIDER_SITE_OTHER): Payer: Medicare HMO | Admitting: Cardiology

## 2020-07-05 VITALS — BP 143/88 | HR 102 | Ht 67.0 in | Wt 270.0 lb

## 2020-07-05 DIAGNOSIS — I480 Paroxysmal atrial fibrillation: Secondary | ICD-10-CM | POA: Diagnosis not present

## 2020-07-05 DIAGNOSIS — R5382 Chronic fatigue, unspecified: Secondary | ICD-10-CM

## 2020-07-05 DIAGNOSIS — G4733 Obstructive sleep apnea (adult) (pediatric): Secondary | ICD-10-CM

## 2020-07-05 DIAGNOSIS — I340 Nonrheumatic mitral (valve) insufficiency: Secondary | ICD-10-CM | POA: Diagnosis not present

## 2020-07-05 DIAGNOSIS — R0600 Dyspnea, unspecified: Secondary | ICD-10-CM | POA: Diagnosis not present

## 2020-07-05 DIAGNOSIS — Z8669 Personal history of other diseases of the nervous system and sense organs: Secondary | ICD-10-CM | POA: Diagnosis not present

## 2020-07-05 DIAGNOSIS — I071 Rheumatic tricuspid insufficiency: Secondary | ICD-10-CM | POA: Diagnosis not present

## 2020-07-05 DIAGNOSIS — I428 Other cardiomyopathies: Secondary | ICD-10-CM

## 2020-07-05 DIAGNOSIS — I5042 Chronic combined systolic (congestive) and diastolic (congestive) heart failure: Secondary | ICD-10-CM | POA: Diagnosis not present

## 2020-07-05 DIAGNOSIS — R0609 Other forms of dyspnea: Secondary | ICD-10-CM

## 2020-07-05 NOTE — Patient Instructions (Signed)
Medication Instructions:  Your Physician recommend you continue on your current medication as directed.    *If you need a refill on your cardiac medications before your next appointment, please call your pharmacy*   Lab Work: None   Testing/Procedures: Your physician has requested that you have an echocardiogram. Echocardiography is a painless test that uses sound waves to create images of your heart. It provides your doctor with information about the size and shape of your heart and how well your heart's chambers and valves are working. This procedure takes approximately one hour. There are no restrictions for this procedure. [place] has recommended that you have a sleep study. This test records several body functions during sleep, including: brain activity, eye movement, oxygen and carbon dioxide blood levels, heart rate and rhythm, breathing rate and rhythm, the flow of air through your mouth and nose, snoring, body muscle movements, and chest and belly movement. [place] [facility]    Follow-Up: At [facility], you and your health needs are our priority.  As part of our continuing mission to provide you with exceptional heart care, we have created designated Provider Care Teams.  These Care Teams include your primary Cardiologist (physician) and Advanced Practice Providers (APPs -  Physician Assistants and Nurse Practitioners) who all work together to provide you with the care you need, when you need it.  We recommend signing up for the patient portal called "MyChart".  Sign up information is provided on this After Visit Summary.  MyChart is used to connect with patients for Virtual Visits (Telemedicine).  Patients are able to view lab/test results, encounter notes, upcoming appointments, etc.  Non-urgent messages can be sent to your provider as well.   To learn more about what you can do with MyChart, go to [email or web address].    Your  next appointment:   2 month(s)  The format for your next appointment:   In Person  Provider:   Buford Dresser, MD  Your physician recommends that you schedule a follow-up appointment with Afib clinic.

## 2020-07-05 NOTE — Progress Notes (Signed)
Cardiology Office Note:    Date:  07/05/2020   ID:  [name], DOB [birth year], MRN [id number]  PCP:  [name], NP  Cardiologist:  Buford Dresser, MD  Referring MD: [name], NP   CC: follow up  History of Present Illness:    [name] is a 64 y.o. female with a hx of atrial fibrillation, chronic systolic and diastolic heart failure, type II diabetes who is seen for follow up today. I initially saw her 03/31/20 as a new consult at the request of [name], NP for the evaluation and management of atrial fibrillation, heart failure.  Today: S/P cardioversion with me 05/06/20. She is back in afib today. Feels that she was back out of rhythm fairly quickly. She feels miserable in afib and knows instantly when she is out of rhythm. Very fatigued, lightheaded, no stamina in afib. Gets short of breath with minimal exertion.  Her EF is abnormal, so we discussed amiodarone vs. Dofetilide today. Given her young age, I would prefer to start with dofetilide. She did have severe MR/severe TR and biatrial enlargement on prior study in 2019. Given this and severe fatigue/dyspnea on exertion, will update echo today.  Has reported OSA, doesn't sleep well or ever get a whole night's sleep. Has a CPAP, but it is "messed up" and has been for a while. Hasn't had a sleep study since 2013. Will update sleep study.  Checks BP at home, log shows 138/72, 127/72, 139/62. Fluid has been well controlled.   Denies chest pain, shortness of breath at rest. No PND, orthopnea, LE edema or unexpected weight gain. No syncope.  Past Medical History:  Diagnosis Date  . Allergic rhinitis   . Arthritis   . Asthma   . Chest pain 12/27/2017  . Chronic diastolic CHF (congestive heart failure) ([place])   . COPD (chronic obstructive pulmonary disease) ([place])   . Depression   . DM (diabetes mellitus) ([place])   . DVT (deep vein thrombosis) in pregnancy   . Dysrhythmia     atrial fibrilation  . GERD (gastroesophageal reflux disease)   . Headache(784.0)   . HTN (hypertension)   . Hyperlipidemia   . Obesity   . Pneumonia 04/2018   RIGHT LOBE  . Shortness of breath   . Sleep apnea    compliant with CPAP    Past Surgical History:  Procedure Laterality Date  . ABDOMINAL HYSTERECTOMY    . CARDIOVERSION N/A 05/06/2020   Procedure: CARDIOVERSION;  Surgeon: Buford Dresser, MD;  Location: [facility] ENDOSCOPY;  Service: Cardiovascular;  Laterality: N/A;  . CATARACT EXTRACTION, BILATERAL    . CHOLECYSTECTOMY    . COLONOSCOPY WITH PROPOFOL N/A 03/05/2015   Procedure: COLONOSCOPY WITH PROPOFOL;  Surgeon: Carol Ada, MD;  Location: [facility] ENDOSCOPY;  Service: Endoscopy;  Laterality: N/A;  . DIAGNOSTIC LAPAROSCOPY    . ingrown hallux Left   . KNEE SURGERY    . LEFT HEART CATH AND CORONARY ANGIOGRAPHY N/A 12/31/2017   Procedure: LEFT HEART CATH AND CORONARY ANGIOGRAPHY;  Surgeon: Charolette Forward, MD;  Location: [facility] CV LAB;  Service: Cardiovascular;  Laterality: N/A;    Current Medications: Current Outpatient Medications on File Prior to Visit  Medication Sig  . Accu-Chek Softclix Lancets lancets Use as instructed to check blood sugar three times daily. E11.69  . albuterol (VENTOLIN HFA) 108 (90 Base) MCG/ACT inhaler Inhale 2 puffs into the lungs every 4 (four) hours as needed for wheezing or shortness of breath.   Marland Kitchen  Alcohol Swabs (B-D SINGLE USE SWABS REGULAR) PADS   . Blood Glucose Monitoring Suppl (ACCU-CHEK GUIDE ME) w/Device KIT Use as instructed to check blood sugar three times daily. E11.69  . dabigatran (PRADAXA) 150 MG CAPS capsule Take 1 capsule (150 mg total) by mouth 2 (two) times daily.  . digoxin (LANOXIN) 0.125 MG tablet Take 0.125 mg by mouth daily.   . Dulaglutide (TRULICITY) 1.5 BC/4.8GQ SOPN Inject 1.5 mg into the skin once a week.  . DULoxetine (CYMBALTA) 60 MG capsule Take 1 capsule (60 mg total) by mouth daily.  Marland Kitchen EPINEPHrine 0.3 mg/0.3  mL IJ SOAJ injection Inject 0.3 mg into the muscle as needed for anaphylaxis.  . furosemide (LASIX) 40 MG tablet Take 1 tablet (40 mg total) by mouth 2 (two) times daily.  Marland Kitchen glucose blood (ACCU-CHEK GUIDE) test strip Use as instructed to check blood sugar three times daily. E11.69  . insulin aspart (NOVOLOG) 100 UNIT/ML injection Inject 12 Units into the skin 3 (three) times daily before meals. For blood sugars 0-199 give 0 units of insulin, 201-250 give 4 units, 251-300 give 6 units, 301-350 give 8 units, 351-400 give 10 units,> 400 give 12 units and call M.D.  . Insulin Syringe-Needle U-100 (TRUEPLUS INSULIN SYRINGE) 31G X 5/16" 0.3 ML MISC Use to administer insulin every day.  . metolazone (ZAROXOLYN) 5 MG tablet Take 1 tablet (5 mg total) by mouth daily.  . Multiple Vitamins-Minerals (MULTIVITAMIN WOMEN PO) Take 1 tablet by mouth daily.   . nitroGLYCERIN (NITROSTAT) 0.4 MG SL tablet Place 1 tablet (0.4 mg total) under the tongue every 5 (five) minutes x 3 doses as needed for chest pain.  . pantoprazole (PROTONIX) 40 MG tablet Take 1 tablet (40 mg total) by mouth 2 (two) times daily.  . RESTASIS 0.05 % ophthalmic emulsion Place 1 drop into both eyes 2 (two) times daily.  . rosuvastatin (CRESTOR) 10 MG tablet Take 1 tablet (10 mg total) by mouth daily.  . metoprolol succinate (TOPROL-XL) 100 MG 24 hr tablet Take 1 tablet (100 mg total) by mouth daily. Take with or immediately following a meal.   No current facility-administered medications on file prior to visit.     Allergies:   Sulfa antibiotics   Social History   Tobacco Use  . Smoking status: Never Smoker  . Smokeless tobacco: Never Used  Vaping Use  . Vaping Use: Never used  Substance Use Topics  . Alcohol use: No  . Drug use: No    Family History: family history includes CAD in an other family member; Cancer in her father and mother; Hypertension in her brother, mother, and sister.  ROS:   Please see the history of present  illness.  Additional pertinent ROS otherwise unremarkable.  EKGs/Labs/Other Studies Reviewed:    The following studies were reviewed today: Echo 07/02/2018 - Left ventricle: The cavity size was normal. Systolic function was  mildly reduced. The estimated ejection fraction was in the range  of 45% to 50%. There is moderate hypokinesis of the  entireanteroseptal myocardium.  - Aortic valve: There was mild regurgitation.  - Mitral valve: Mildly dilated, calcified annulus. There was severe  regurgitation.  - Left atrium: The atrium was severely dilated.  - Right ventricle: The cavity size was mildly dilated. Wall  thickness was normal. Systolic function was mildly to moderately  reduced.  - Right atrium: The atrium was severely dilated.  - Tricuspid valve: There was severe regurgitation.  - Pulmonary arteries: Systolic pressure was  mildly increased. PA  peak pressure: 31 mm Hg (S).   Cath 12/31/2017  Prox LAD lesion is 10% stenosed.  Dist LM to Ost LAD lesion is 10% stenosed.  Ost Cx lesion is 15% stenosed.  The left ventricular systolic function is normal.  LV end diastolic pressure is normal.   EKG:  EKG is personally reviewed.  The ekg ordered today demonstrates atrial fibrillation, rate 106 bpm  Recent Labs: 12/23/2019: TSH 2.820 03/02/2020: B Natriuretic Peptide 43.7 03/08/2020: Magnesium 1.8 04/21/2020: ALT 17 05/03/2020: Platelets 236 05/06/2020: BUN 39; Creatinine, Ser 2.00; Hemoglobin 15.3; Potassium 3.6; Sodium 139  Recent Lipid Panel    Component Value Date/Time   CHOL 140 06/24/2020 1054   TRIG 102 06/24/2020 1054   HDL 46 06/24/2020 1054   CHOLHDL 3.0 06/24/2020 1054   CHOLHDL 3.1 12/30/2017 0436   VLDL 20 12/30/2017 0436   LDLCALC 75 06/24/2020 1054    Physical Exam:    VS:  BP (!) 143/88 (BP Location: Left Arm, Patient Position: Sitting)   Pulse (!) 102   Ht _0  (1.702 m)   Wt 270 lb (122.5 kg)   SpO2 95%   BMI 42.29 kg/m     Wt  Readings from Last 3 Encounters:  07/05/20 270 lb (122.5 kg)  06/24/20 282 lb (127.9 kg)  04/30/20 267 lb 6.4 oz (121.3 kg)    GEN: Well nourished, well developed in no acute distress HEENT: Normal, moist mucous membranes NECK: No JVD CARDIAC: irregularly irregular rhythm, normal S1 and S2, no rubs or gallops. 2/6 holosystolic murmur. VASCULAR: Radial and DP pulses 2+ bilaterally. No carotid bruits RESPIRATORY:  Clear to auscultation without rales, wheezing or rhonchi  ABDOMEN: Soft, non-tender, non-distended MUSCULOSKELETAL:  Ambulates independently SKIN: Warm and dry, trace bilateral LE edema NEUROLOGIC:  Alert and oriented x 3. No focal neuro deficits noted. PSYCHIATRIC:  Normal affect   ASSESSMENT:    1. Paroxysmal atrial fibrillation (HCC)   2. Chronic combined systolic and diastolic heart failure ([place])   3. NICM (nonischemic cardiomyopathy) ([place])   4. History of obstructive sleep apnea   5. Severe mitral regurgitation   6. Severe tricuspid regurgitation   7. Chronic fatigue   8. DOE (dyspnea on exertion)    PLAN:    Paroxysmal atrial fibrillation: -we discussed options for management. Did not hold long after cardioversion. We discussed dofetilide and amiodarone today. I would prefer to start with dofetilide, but she is not sure she wants to come into the hospital for this. -I will set her up with the atrial fibrillation while she considers options -CHA2DS2/VAS Stroke Risk Points=4, on dabigatran, no missed doses  Nonischemic cardiomyopathy, with acute on chronic combined systolic and diastolic heart failure: last EF 45-50% -see prior med management -on digoxin, dulaglutide, furosemide, metolazone, metoprolol succinate -No room to add ACEI/ARB/ARNI/MRA currently due to renal function, last Cr 2  History of obstructive sleep apnea, with CPAP at home: -reports she has been sleeping poorly, CPAP not working -will update her sleep study  Severe MR, severe TR: -seen on  prior echo -as her volume status has improved somewhat, will update echo -if still severe MR, would consider TEE -MR likely exacerbating atrial stretch/afib  Type II diabetes: -per [facility], last A1c was 9.8 06/2020 -on insulin -also on dulaglutide (GLP1RA) -her GFR is <30, so SGLT2i is not recommended -on rosuvastatin 10 mg daily for diabetic dyslipidemia -per [facility], lipids 06/2020 show Tchol 140, HDL 46, LDL 75, TG 102 -would not add  aspirin as she is on dabigatran  Strong family history of heart disease/heart failure: she is unclear of the etiology of this  Cardiac risk counseling and prevention recommendations: -recommend heart healthy/Mediterranean diet, with whole grains, fruits, vegetable, fish, lean meats, nuts, and olive oil. Limit salt. -recommend moderate walking, 3-5 times/week for 30-50 minutes each session. Aim for at least 150 minutes.week. Goal should be pace of 3 miles/hours, or walking 1.5 miles in 30 minutes -recommend avoidance of tobacco products. Avoid excess alcohol. -Additional risk factor control:  -Weight: BMI 42. Would benefit from weight loss given morbid obesity with comorbidities -ASCVD risk score: The 10-year ASCVD risk score Mikey Bussing DC Brooke Bonito., et al., 2013) is: 18.9%   Values used to calculate the score:     Age: 9 years     Sex: Female     Is Non-Hispanic African American: Yes     Diabetic: Yes     Tobacco smoker: No     Systolic Blood Pressure: 329 mmHg     Is BP treated: Yes     HDL Cholesterol: 46 mg/dL     Total Cholesterol: 140 mg/dL    Plan for follow up: 2 mos or sooner as needed  Buford Dresser, MD, PhD [place]  [facility]    Medication Adjustments/Labs and Tests Ordered: Current medicines are reviewed at length with the patient today.  Concerns regarding medicines are outlined above.  Orders Placed This Encounter  Procedures  . Amb Referral to AFIB Clinic  . EKG 12-Lead  . ECHOCARDIOGRAM COMPLETE  . Split night study   No  orders of the defined types were placed in this encounter.   Patient Instructions  Medication Instructions:  Your Physician recommend you continue on your current medication as directed.    *If you need a refill on your cardiac medications before your next appointment, please call your pharmacy*   Lab Work: None   Testing/Procedures: Your physician has requested that you have an echocardiogram. Echocardiography is a painless test that uses sound waves to create images of your heart. It provides your doctor with information about the size and shape of your heart and how well your heart's chambers and valves are working. This procedure takes approximately one hour. There are no restrictions for this procedure. [place] has recommended that you have a sleep study. This test records several body functions during sleep, including: brain activity, eye movement, oxygen and carbon dioxide blood levels, heart rate and rhythm, breathing rate and rhythm, the flow of air through your mouth and nose, snoring, body muscle movements, and chest and belly movement. [place] [facility]    Follow-Up: At [facility], you and your health needs are our priority.  As part of our continuing mission to provide you with exceptional heart care, we have created designated Provider Care Teams.  These Care Teams include your primary Cardiologist (physician) and Advanced Practice Providers (APPs -  Physician Assistants and Nurse Practitioners) who all work together to provide you with the care you need, when you need it.  We recommend signing up for the patient portal called "MyChart".  Sign up information is provided on this After Visit Summary.  MyChart is used to connect with patients for Virtual Visits (Telemedicine).  Patients are able to view lab/test results, encounter notes, upcoming appointments, etc.  Non-urgent messages can be sent to your provider as well.   To  learn more about what you can do with  MyChart, go to [email or web address].    Your next appointment:   2 month(s)  The format for your next appointment:   In Person  Provider:   Buford Dresser, MD  Your physician recommends that you schedule a follow-up appointment with Afib clinic.    Signed, Buford Dresser, MD PhD 07/05/2020    [place]

## 2020-07-07 ENCOUNTER — Telehealth: Payer: Self-pay | Admitting: Cardiovascular Disease

## 2020-07-07 NOTE — Telephone Encounter (Signed)
PA approved PMVA#[id number].  Called patient and told her the appointment is Monday, December 27th at 8 pm, to expect info packett and gave her the sleep lab number.

## 2020-07-08 ENCOUNTER — Other Ambulatory Visit: Payer: Self-pay

## 2020-07-08 ENCOUNTER — Ambulatory Visit (HOSPITAL_COMMUNITY)
Admission: RE | Admit: 2020-07-08 | Discharge: 2020-07-08 | Disposition: A | Discharge status: Home / Self Care | Payer: Medicare HMO | Source: Ambulatory Visit | Attending: Cardiology | Admitting: Cardiology

## 2020-07-08 DIAGNOSIS — I5042 Chronic combined systolic (congestive) and diastolic (congestive) heart failure: Secondary | ICD-10-CM | POA: Diagnosis not present

## 2020-07-08 DIAGNOSIS — R0600 Dyspnea, unspecified: Secondary | ICD-10-CM | POA: Insufficient documentation

## 2020-07-08 DIAGNOSIS — R5382 Chronic fatigue, unspecified: Secondary | ICD-10-CM

## 2020-07-08 DIAGNOSIS — I081 Rheumatic disorders of both mitral and tricuspid valves: Secondary | ICD-10-CM | POA: Insufficient documentation

## 2020-07-08 DIAGNOSIS — J449 Chronic obstructive pulmonary disease, unspecified: Secondary | ICD-10-CM | POA: Insufficient documentation

## 2020-07-08 DIAGNOSIS — I428 Other cardiomyopathies: Secondary | ICD-10-CM | POA: Diagnosis not present

## 2020-07-08 DIAGNOSIS — R0609 Other forms of dyspnea: Secondary | ICD-10-CM

## 2020-07-08 DIAGNOSIS — I071 Rheumatic tricuspid insufficiency: Secondary | ICD-10-CM | POA: Diagnosis not present

## 2020-07-08 NOTE — Progress Notes (Signed)
  Echocardiogram 2D Echocardiogram has been performed.  [name] 07/08/2020, 12:10 PM

## 2020-07-09 LAB — ECHOCARDIOGRAM COMPLETE
Area-P 1/2: 3.42 cm2
MV M vel: 5.47 m/s
MV Peak grad: 119.7 mmHg
Radius: 0.6 cm
S' Lateral: 4.1 cm

## 2020-07-13 ENCOUNTER — Encounter (HOSPITAL_COMMUNITY): Payer: Self-pay | Admitting: Nurse Practitioner

## 2020-07-13 ENCOUNTER — Ambulatory Visit (HOSPITAL_COMMUNITY)
Admission: RE | Admit: 2020-07-13 | Discharge: 2020-07-13 | Disposition: A | Discharge status: Home / Self Care | Payer: Medicare HMO | Source: Ambulatory Visit | Attending: Nurse Practitioner | Admitting: Nurse Practitioner

## 2020-07-13 ENCOUNTER — Other Ambulatory Visit: Payer: Self-pay

## 2020-07-13 VITALS — BP 128/90 | HR 90 | Ht 67.0 in | Wt 278.6 lb

## 2020-07-13 DIAGNOSIS — D6869 Other thrombophilia: Secondary | ICD-10-CM | POA: Diagnosis not present

## 2020-07-13 DIAGNOSIS — I4811 Longstanding persistent atrial fibrillation: Secondary | ICD-10-CM | POA: Insufficient documentation

## 2020-07-13 DIAGNOSIS — R0602 Shortness of breath: Secondary | ICD-10-CM | POA: Insufficient documentation

## 2020-07-13 DIAGNOSIS — Z79899 Other long term (current) drug therapy: Secondary | ICD-10-CM | POA: Diagnosis not present

## 2020-07-13 DIAGNOSIS — R5383 Other fatigue: Secondary | ICD-10-CM | POA: Diagnosis not present

## 2020-07-13 DIAGNOSIS — Z8249 Family history of ischemic heart disease and other diseases of the circulatory system: Secondary | ICD-10-CM | POA: Diagnosis not present

## 2020-07-13 NOTE — Progress Notes (Signed)
Primary Care Physician: [name], NP Referring Physician: Dr. Andree Elk  Previous cardiology- Dr. Carolin Guernsey [name] is a 64 y.o. female with a h/o hx of  many  years of longstanding persisitent afib( ekg's reviewed from all the back to 2011 show persistent  afib) , previoulsy  followed by Dr. Terrence Dupont until recently, established with  Dr. Harrell Gave, 03/31/20. She set her up for a cardioversion, she did convert to SR, but she had ERAF possibly after just a few hours. 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 She  did at one time weigh around 500-600 lbs but has been at her current weight of 278 lbs for a while. Her left atrium size is 5.3 indicating severe left atrial enlargement.She  is bothered with fatigue and shortness of breath. She is on pradaxa 150 mg bid. Dr. Harrell Gave wanted me to discuss tikosyn admit with the pt.    Today, she denies symptoms of palpitations, chest pain, shortness of breath, orthopnea, PND, lower extremity edema, dizziness, presyncope, syncope, or neurologic sequela. The patient is tolerating medications without difficulties and is otherwise without complaint today.   Past Medical History:  Diagnosis Date  . Allergic rhinitis   . Arthritis   . Asthma   . Chest pain 12/27/2017  . Chronic diastolic CHF (congestive heart failure) ([place])   . COPD (chronic obstructive pulmonary disease) ([place])   . Depression   . DM (diabetes mellitus) ([place])   . DVT (deep vein thrombosis) in pregnancy   . Dysrhythmia    atrial fibrilation  . GERD (gastroesophageal reflux disease)   . Headache(784.0)   . HTN (hypertension)   . Hyperlipidemia   . Obesity   . Pneumonia 04/2018   RIGHT LOBE  . Shortness of breath   . Sleep apnea    compliant with CPAP   Past Surgical History:  Procedure Laterality Date  . ABDOMINAL HYSTERECTOMY    . CARDIOVERSION N/A 05/06/2020   Procedure: CARDIOVERSION;  Surgeon:  Buford Dresser, MD;  Location: [facility] ENDOSCOPY;  Service: Cardiovascular;  Laterality: N/A;  . CATARACT EXTRACTION, BILATERAL    . CHOLECYSTECTOMY    . COLONOSCOPY WITH PROPOFOL N/A 03/05/2015   Procedure: COLONOSCOPY WITH PROPOFOL;  Surgeon: Carol Ada, MD;  Location: [facility] ENDOSCOPY;  Service: Endoscopy;  Laterality: N/A;  . DIAGNOSTIC LAPAROSCOPY    . ingrown hallux Left   . KNEE SURGERY    . LEFT HEART CATH AND CORONARY ANGIOGRAPHY N/A 12/31/2017   Procedure: LEFT HEART CATH AND CORONARY ANGIOGRAPHY;  Surgeon: Charolette Forward, MD;  Location: [facility] LAB;  Service: Cardiovascular;  Laterality: N/A;    Current Outpatient Medications  Medication Sig Dispense Refill  . Accu-Chek Softclix Lancets lancets Use as instructed to check blood sugar three times daily. E11.69 100 each 12  . albuterol (VENTOLIN HFA) 108 (90 Base) MCG/ACT inhaler Inhale 2 puffs into the lungs every 4 (four) hours as needed for wheezing or shortness of breath.     . Alcohol Swabs (B-D SINGLE USE SWABS REGULAR) PADS     . Blood Glucose Monitoring Suppl (ACCU-CHEK GUIDE ME) w/Device KIT Use as instructed to check blood sugar three times daily. E11.69 1 kit 0  . dabigatran (PRADAXA) 150 MG CAPS capsule Take 1 capsule (150 mg total) by mouth 2 (two) times daily. 60 capsule 2  . digoxin (LANOXIN) 0.125 MG tablet Take 0.125 mg by mouth daily.     Marland Kitchen  Dulaglutide (TRULICITY) 1.5 ZJ/6.7HA SOPN Inject 1.5 mg into the skin once a week. 0.5 mL 3  . DULoxetine (CYMBALTA) 60 MG capsule Take 1 capsule (60 mg total) by mouth daily. 30 capsule 3  . EPINEPHrine 0.3 mg/0.3 mL IJ SOAJ injection Inject 0.3 mg into the muscle as needed for anaphylaxis. 1 each 1  . furosemide (LASIX) 40 MG tablet Take 1 tablet (40 mg total) by mouth 2 (two) times daily. 180 tablet 3  . gabapentin (NEURONTIN) 300 MG capsule     . glucose blood (ACCU-CHEK GUIDE) test strip Use as instructed to check blood sugar three times daily. E11.69 100 each 12  .  HYDROcodone-acetaminophen (NORCO/VICODIN) 5-325 MG tablet Take 1 tablet by mouth 4 (four) times daily.    . insulin aspart (NOVOLOG) 100 UNIT/ML injection Inject 12 Units into the skin 3 (three) times daily before meals. For blood sugars 0-199 give 0 units of insulin, 201-250 give 4 units, 251-300 give 6 units, 301-350 give 8 units, 351-400 give 10 units,> 400 give 12 units and call M.D. 10 mL 3  . Insulin Syringe-Needle U-100 (TRUEPLUS INSULIN SYRINGE) 31G X 5/16" 0.3 ML MISC Use to administer insulin every day. 100 each 11  . LEVEMIR FLEXTOUCH 100 UNIT/ML FlexPen Inject into the skin.    Marland Kitchen metolazone (ZAROXOLYN) 5 MG tablet Take 1 tablet (5 mg total) by mouth daily. 90 tablet 3  . metoprolol succinate (TOPROL-XL) 100 MG 24 hr tablet Take 1 tablet (100 mg total) by mouth daily. Take with or immediately following a meal. 90 tablet 3  . Multiple Vitamins-Minerals (MULTIVITAMIN WOMEN PO) Take 1 tablet by mouth daily.     . nitroGLYCERIN (NITROSTAT) 0.4 MG SL tablet Place 1 tablet (0.4 mg total) under the tongue every 5 (five) minutes x 3 doses as needed for chest pain. 25 tablet 12  . pantoprazole (PROTONIX) 40 MG tablet Take 1 tablet (40 mg total) by mouth 2 (two) times daily. 180 tablet 1  . RESTASIS 0.05 % ophthalmic emulsion Place 1 drop into both eyes 2 (two) times daily.  12  . rosuvastatin (CRESTOR) 10 MG tablet Take 1 tablet (10 mg total) by mouth daily. 90 tablet 1   No current facility-administered medications for this encounter.    Allergies  Allergen Reactions  . Sulfa Antibiotics Itching    Social History   Socioeconomic History  . Marital status: Widowed    Spouse name: Not on file  . Number of children: Not on file  . Years of education: Not on file  . Highest education level: Not on file  Occupational History  . Not on file  Tobacco Use  . Smoking status: Never Smoker  . Smokeless tobacco: Never Used  Vaping Use  . Vaping Use: Never used  Substance and Sexual Activity   . Alcohol use: No  . Drug use: No  . Sexual activity: Not on file  Other Topics Concern  . Not on file  Social History Narrative   Pt lives in [place] with spouse.  2 grown children.   Previously worked in [employer] at [employer].  Now on disability   Social Determinants of Health   Financial Resource Strain:   . Difficulty of Paying Living Expenses: Not on file  Food Insecurity:   . Worried About Charity fundraiser in the Last Year: Not on file  . Ran Out of Food in the Last Year: Not on file  Transportation Needs:   . Lack of  Transportation (Medical): Not on file  . Lack of Transportation (Non-Medical): Not on file  Physical Activity:   . Days of Exercise per Week: Not on file  . Minutes of Exercise per Session: Not on file  Stress:   . Feeling of Stress : Not on file  Social Connections:   . Frequency of Communication with Friends and Family: Not on file  . Frequency of Social Gatherings with Friends and Family: Not on file  . Attends Religious Services: Not on file  . Active Member of Clubs or Organizations: Not on file  . Attends Archivist Meetings: Not on file  . Marital Status: Not on file  Intimate Partner Violence:   . Fear of Current or Ex-Partner: Not on file  . Emotionally Abused: Not on file  . Physically Abused: Not on file  . Sexually Abused: Not on file    Family History  Problem Relation Age of Onset  . Cancer Mother   . Hypertension Mother   . Cancer Father   . CAD Other   . Hypertension Sister   . Hypertension Brother     ROS- All systems are reviewed and negative except as per the HPI above  Physical Exam: Vitals:   07/13/20 1001  BP: 128/90  Pulse: 90  Weight: 126.4 kg  Height: _0  (1.702 m)   Wt Readings from Last 3 Encounters:  07/13/20 126.4 kg  07/05/20 122.5 kg  06/24/20 127.9 kg    Labs: Lab Results  Component Value Date   NA 139 05/06/2020   K 3.6 05/06/2020   CL 94 (L) 05/06/2020   CO2 29 04/21/2020    GLUCOSE 203 (H) 05/06/2020   BUN 39 (H) 05/06/2020   CREATININE 2.00 (H) 05/06/2020   CALCIUM 9.7 04/21/2020   PHOS 3.2 03/08/2020   MG 1.8 03/08/2020   Lab Results  Component Value Date   INR 1.5 (H) 03/02/2020   Lab Results  Component Value Date   CHOL 140 06/24/2020   HDL 46 06/24/2020   LDLCALC 75 06/24/2020   TRIG 102 06/24/2020     GEN- The patient is well appearing, alert and oriented x 3 today.   Head- normocephalic, atraumatic Eyes-  Sclera clear, conjunctiva pink Ears- hearing intact Oropharynx- clear Neck- supple, no JVP Lymph- no cervical lymphadenopathy Lungs- Clear to ausculation bilaterally, normal work of breathing Heart- irregular rate and rhythm, no murmurs, rubs or gallops, PMI not laterally displaced GI- soft, NT, ND, + BS Extremities- no clubbing, cyanosis, or edema MS- no significant deformity or atrophy Skin- no rash or lesion Psych- euthymic mood, full affect Neuro- strength and sensation are intact  EKG-afib at 90 bpm, qrs int 80 ms, qtc 452 ms   Echo-1. Left ventricular ejection fraction, by estimation, is 45 to 50%. The  left ventricle has mildly decreased function. The left ventricle  demonstrates global hypokinesis. Left ventricular diastolic function could  not be evaluated.  2. Right ventricular systolic function was not well visualized. The right  ventricular size is moderately enlarged. There is normal pulmonary artery  systolic pressure.  3. Left atrial size was mildly dilated. (LA diam at 5.30)  4. Right atrial size was moderately dilated.  5. Eccentric MR jet; appears mild-moderate on parasternal views, but on  apical views (see images 52, 66, 72) extends into distal atrium and may  have Coanda effect. Incompletely visualized and PV not sampled, suspect  moderate to severe MR.. The mitral  valve is rheumatic. Moderate  to severe mitral valve regurgitation.  6. Tricuspid valve regurgitation is severe.  7. The aortic valve  is tricuspid. Aortic valve regurgitation is not  visualized. No aortic stenosis is present.  8. The inferior vena cava is normal in size with <50% respiratory  variability, suggesting right atrial pressure of 8 mmHg.   Comparison(s): No significant change from prior study.   Conclusion(s)/Recommendation(s): Difficult images. Likely severe TR and  moderate-severe MR, incompletely visualized on this study. RV is enlarged,  function/thickness not well seen.    Assessment and Plan: 1. Longstanding  Persistent afib All ekg's reviewed  Persistent afib since at least 2011, probably longer Valvular status probably contributing  Discussed with Dr. Rayann Heman  and Dr. Harrell Gave Will try to admit for Tikosyn first, if not successful,  can later explore surgery for valvular issues, repair with a Maze  She is on metolazone, contraindicated with tikosyn Will discuss with PharmD best way to stop this, increase daily  Diuretic dose  ? Pt states that her fluid is better regulated since she lost weight  No benadryl use Continue  metoprolol 100 mg daily  2. CHA2DS2VASc score of 3 Continue  pradax 150 mg bid  She states no missed doses for at least 3 weeks and reminded not to miss doses   We will be back in touch with pt next  week to arrange for tikosyn admit  Butch Penny C. Dekota Kirlin, [facility] [address] [place], [place] [address] [phone]

## 2020-07-14 ENCOUNTER — Telehealth: Payer: Self-pay | Admitting: Pharmacist

## 2020-07-14 NOTE — Telephone Encounter (Signed)
Medication list reviewed in anticipation of upcoming Tikosyn initiation. Patient is taking one contraindicated or QTc prolonging medications.  Metolazone is a thiazide related diuretic. Although HCTZ is specifically cited as being contraindicated due to prolonged QTc and possible increase in serum level of Tikosyn, the risk likely extends to metolazone.  Patient is also on furosemide, therefore, electrolytes should be monitored closely. K should be >4 and Mag >2 before stating and during therapy with Tikosyn.   In order to get patient off of metolazone, I would recommend switching furosemide to torsemide as torsemide usually has better absorption. I would start with torsemide 40mg  DAILY.   Patient is anticoagulated on Pradaxa on the appropriate dose. Please ensure that patient has not missed any anticoagulation doses in the 3 weeks prior to Tikosyn initiation.   Patient will need to be counseled to avoid use of Benadryl while on Tikosyn and in the 2-3 days prior to Tikosyn initiation.

## 2020-07-21 ENCOUNTER — Other Ambulatory Visit (INDEPENDENT_AMBULATORY_CARE_PROVIDER_SITE_OTHER): Payer: Self-pay | Admitting: Primary Care

## 2020-07-21 DIAGNOSIS — E669 Obesity, unspecified: Secondary | ICD-10-CM

## 2020-07-21 DIAGNOSIS — E1169 Type 2 diabetes mellitus with other specified complication: Secondary | ICD-10-CM

## 2020-07-25 DIAGNOSIS — S82891A Other fracture of right lower leg, initial encounter for closed fracture: Secondary | ICD-10-CM | POA: Diagnosis not present

## 2020-07-27 ENCOUNTER — Ambulatory Visit (HOSPITAL_BASED_OUTPATIENT_CLINIC_OR_DEPARTMENT_OTHER): Payer: Medicare HMO | Attending: Cardiology | Admitting: Cardiovascular Disease

## 2020-07-27 ENCOUNTER — Other Ambulatory Visit: Payer: Self-pay

## 2020-07-27 DIAGNOSIS — I4811 Longstanding persistent atrial fibrillation: Secondary | ICD-10-CM | POA: Diagnosis not present

## 2020-07-27 DIAGNOSIS — R0902 Hypoxemia: Secondary | ICD-10-CM | POA: Diagnosis not present

## 2020-07-27 DIAGNOSIS — G4736 Sleep related hypoventilation in conditions classified elsewhere: Secondary | ICD-10-CM | POA: Insufficient documentation

## 2020-07-27 DIAGNOSIS — Z8669 Personal history of other diseases of the nervous system and sense organs: Secondary | ICD-10-CM | POA: Insufficient documentation

## 2020-07-27 DIAGNOSIS — G4733 Obstructive sleep apnea (adult) (pediatric): Secondary | ICD-10-CM

## 2020-07-29 ENCOUNTER — Telehealth: Payer: Self-pay | Admitting: Cardiology

## 2020-07-29 NOTE — Telephone Encounter (Signed)
Pt updated and verbalized understanding.  

## 2020-07-29 NOTE — Telephone Encounter (Signed)
Patient CAN use tylenol but should try to avoid IBU

## 2020-07-29 NOTE — Telephone Encounter (Signed)
    Pt c/o medication issue:  1. Name of Medication: Tylenol, Ibuprofen  2. How are you currently taking this medication (dosage and times per day)?   3. Are you having a reaction (difficulty breathing--STAT)?   4. What is your medication issue? Pt said Afib clinic and she was told not to take tylenol and ibuprofen but didn't tell her what's wrong with her. She said she have a bad headache right now and doesn't know what to take.

## 2020-07-29 NOTE — Telephone Encounter (Signed)
Spoke with pt who report she was instructed by Afib clinic that she can't take tylenol or ibuprofen. Pt state she has a headache this morning and seeking clarification.  Will route to Pharm D for recommendations.

## 2020-07-30 ENCOUNTER — Other Ambulatory Visit (HOSPITAL_COMMUNITY): Payer: Medicare HMO

## 2020-08-09 NOTE — Procedures (Signed)
Patient Name: [name], [name] Date: 07/27/2020 Gender: Female D.O.B: 21-Feb-1956 Age (years): 64 Referring Provider: Buford Dresser Height (inches): 70 Interpreting Physician: Shelva Majestic MD, ABSM Weight (lbs): 270 RPSGT: Zadie Rhine BMI: 42 MRN: [id number] Neck Size: 17.00  CLINICAL INFORMATION Sleep Study Type: NPSG  Indication for sleep study: Atrial fibrillation, OSA  Epworth Sleepiness Score: 8  SLEEP STUDY TECHNIQUE As per the AASM Manual for the Scoring of Sleep and Associated Events v2.3 (April 2016) with a hypopnea requiring 4% desaturations.  The channels recorded and monitored were frontal, central and occipital EEG, electrooculogram (EOG), submentalis EMG (chin), nasal and oral airflow, thoracic and abdominal wall motion, anterior tibialis EMG, snore microphone, electrocardiogram, and pulse oximetry.  MEDICATIONS dabigatran (PRADAXA) 150 MG CAPS capsule digoxin (LANOXIN) 0.125 MG tablet Dulaglutide (TRULICITY) 1.5 OB/0.9GG SOPN DULoxetine (CYMBALTA) 60 MG capsule EPINEPHrine 0.3 mg/0.3 mL IJ SOAJ injection furosemide (LASIX) 40 MG tablet gabapentin (NEURONTIN) 300 MG capsule HYDROcodone-acetaminophen (NORCO/VICODIN) 5-325 MG tablet insulin aspart (NOVOLOG) 100 UNIT/ML injection Insulin Syringe-Needle U-100 (TRUEPLUS INSULIN SYRINGE) 31G X 5/16" 0.3 ML MISC LEVEMIR FLEXTOUCH 100 UNIT/ML FlexPen metolazone (ZAROXOLYN) 5 MG tablet metoprolol succinate (TOPROL-XL) 100 MG 24 hr tablet(Expired) Multiple Vitamins-Minerals (MULTIVITAMIN WOMEN PO) nitroGLYCERIN (NITROSTAT) 0.4 MG SL tablet pantoprazole (PROTONIX) 40 MG tablet RESTASIS 0.05 % ophthalmic emulsion rosuvastatin (CRESTOR) 10 MG tablet Medications self-administered by patient taken the night of the study : LASIX, NEURONTIN, NOVOLOG, [place], PROTONIX, LEVEMIR [place] The study was initiated at 10:58:11 PM and ended at 4:51:47 AM.  Sleep onset time was  69.8 minutes and the sleep efficiency was 51.8%%. The total sleep time was 183 minutes.  Stage REM latency was 116.0 minutes.  The patient spent 10.7%% of the night in stage N1 sleep, 88.0%% in stage N2 sleep, 0.3%% in stage N3 and 1.1% in REM.  Alpha intrusion was absent.  Supine sleep was 100.00%.  RESPIRATORY PARAMETERS The overall apnea/hypopnea index (AHI) was 1.6 per hour. The respiratory disturbance index (RDI) was 3.0/h.There were 0 total apneas, including 0 obstructive, 0 central and 0 mixed apneas. There were 5 hypopneas and 4 RERAs.  The AHI during Stage REM sleep was 0.0 per hour.  AHI while supine was 1.6 per hour.  The mean oxygen saturation was 95.0%. The minimum SpO2 during sleep was 87.0%.  No snoring was noted during this study.  CARDIAC DATA The 2 lead EKG demonstrated sinus rhythm. The mean heart rate was 75.1 beats per minute. Other EKG findings include: None.  LEG MOVEMENT DATA The total PLMS were 0 with a resulting PLMS index of 0.0. Associated arousal with leg movement index was 1.0 .  IMPRESSIONS - No significant obstructive sleep apnea occurred during this study (AHI = 1.6/h). - No significant central sleep apnea occurred during this study (CAI = 0.0/h). - Mild oxygen desaturation to a nadir of 87.0%. - No snoring was audible during this study. - Reduced sleep efficiency at 51%. - Pt was in atrial fibrillation, rare to occasional PVCs - Clinically significant periodic limb movements did not occur during sleep. No significant associated arousals.  DIAGNOSIS - Nocturnal Hypoxemia (G47.36)  RECOMMENDATIONS - No significant sleep disordered breathing. - Avoid alcohol, sedatives and other CNS depressants that may worsen sleep apnea and disrupt normal sleep architecture. - Sleep hygiene should be reviewed to assess factors that may improve sleep quality, efficiency and duration.  - Weight management (BMI 42) and regular exercise should be initiated or  continued if appropriate.  [Electronically  signed] 08/10/2020 10:18 AM  Shelva Majestic MD, [facility], ABSM Diplomate, American Board of Sleep Medicine   NPI: [id number] [place] PH: [phone]   FX: [phone] [place]

## 2020-08-10 ENCOUNTER — Telehealth: Payer: Self-pay | Admitting: *Deleted

## 2020-08-10 ENCOUNTER — Encounter (HOSPITAL_BASED_OUTPATIENT_CLINIC_OR_DEPARTMENT_OTHER): Payer: Self-pay | Admitting: Cardiovascular Disease

## 2020-08-10 NOTE — Telephone Encounter (Signed)
-----   Message from Troy Sine, MD sent at 08/10/2020 10:29 AM EST ----- Gae Bon, please notify pt of results

## 2020-08-10 NOTE — Telephone Encounter (Signed)
Informed patient of sleep study results and patient understanding was verbalized. Patient understands her sleep study showed  IMPRESSIONS - No significant obstructive sleep apnea occurred during this study (AHI = 1.6/h). - Mild oxygen desaturation to a nadir of 87.0%. - No snoring was audible during this study. - Reduced sleep efficiency at 51%. - Pt was in atrial fibrillation, rare to occasional PVCs - Clinically significant periodic limb movements did not occur during sleep. No significant associated arousals.  DIAGNOSIS - Nocturnal Hypoxemia (G47.36)  RECOMMENDATIONS - No significant sleep disordered breathing  Pt is aware and agreeable to normal results.

## 2020-08-16 ENCOUNTER — Encounter (HOSPITAL_BASED_OUTPATIENT_CLINIC_OR_DEPARTMENT_OTHER): Payer: Medicare HMO | Admitting: Cardiovascular Disease

## 2020-09-07 ENCOUNTER — Encounter: Payer: Self-pay | Admitting: Cardiology

## 2020-09-08 ENCOUNTER — Ambulatory Visit (INDEPENDENT_AMBULATORY_CARE_PROVIDER_SITE_OTHER): Payer: Medicare Other | Admitting: Cardiology

## 2020-09-08 ENCOUNTER — Other Ambulatory Visit: Payer: Self-pay

## 2020-09-08 ENCOUNTER — Encounter: Payer: Self-pay | Admitting: Cardiology

## 2020-09-08 VITALS — BP 118/70 | HR 108 | Ht 67.0 in | Wt 276.0 lb

## 2020-09-08 DIAGNOSIS — I071 Rheumatic tricuspid insufficiency: Secondary | ICD-10-CM

## 2020-09-08 DIAGNOSIS — I340 Nonrheumatic mitral (valve) insufficiency: Secondary | ICD-10-CM

## 2020-09-08 DIAGNOSIS — Z79899 Other long term (current) drug therapy: Secondary | ICD-10-CM

## 2020-09-08 DIAGNOSIS — I4811 Longstanding persistent atrial fibrillation: Secondary | ICD-10-CM | POA: Diagnosis not present

## 2020-09-08 DIAGNOSIS — I428 Other cardiomyopathies: Secondary | ICD-10-CM

## 2020-09-08 DIAGNOSIS — I5042 Chronic combined systolic (congestive) and diastolic (congestive) heart failure: Secondary | ICD-10-CM | POA: Diagnosis not present

## 2020-09-08 DIAGNOSIS — D6869 Other thrombophilia: Secondary | ICD-10-CM

## 2020-09-08 MED ORDER — METOPROLOL SUCCINATE ER 100 MG PO TB24: 100.0000 mg | ORAL_TABLET | Freq: Two times a day (BID) | ORAL | 3 refills | Status: DC

## 2020-09-08 NOTE — Progress Notes (Signed)
Cardiology Office Note:    Date:  09/08/2020   ID:  [name], DOB 1955/10/12, MRN [id number]  PCP:  [name], NP  Cardiologist:  Buford Dresser, MD  Referring MD: [name], NP   CC: follow up  History of Present Illness:    [name] is a 65 y.o. female with a hx of atrial fibrillation, chronic systolic and diastolic heart failure, type II diabetes who is seen for follow up today. I initially saw her 03/31/20 as a new consult at the request of [name], NP for the evaluation and management of atrial fibrillation, heart failure.  Today: Very fatigued. Short of breath with walking minimal distances. Has been waiting to hear about tikosyn from afib clinic, but given spike in Covid cases elective admissions have been postponed. We again discussed amiodarone in the short term vs. Waiting for [place] admission. She would like to wait for admission at this time.  Hasn't checked BP at home, needs new batteries in her cuff.  Still taking metolazone 5 mg daily with her furosemide 40 gm daily. Last Cr 2. Has not been seen by nephrology. Will recheck BMET today, trial lasix BID and hold metolazone.  Swelling, fluid, weights have been stable.   Reviewed sleep study, no significant events noted based on this. She reports that she barely slept during the study. Remains with poor sleep at home, under a lot of stress.  Past Medical History:  Diagnosis Date  . Allergic rhinitis   . Arthritis   . Asthma   . Chest pain 12/27/2017  . Chronic diastolic CHF (congestive heart failure) ([place])   . COPD (chronic obstructive pulmonary disease) ([place])   . Depression   . DM (diabetes mellitus) ([place])   . DVT (deep vein thrombosis) in pregnancy   . Dysrhythmia    atrial fibrilation  . GERD (gastroesophageal reflux disease)   . Headache(784.0)   . HTN (hypertension)   . Hyperlipidemia   . Obesity   . Pneumonia 04/2018   RIGHT LOBE  . Shortness  of breath   . Sleep apnea    compliant with CPAP    Past Surgical History:  Procedure Laterality Date  . ABDOMINAL HYSTERECTOMY    . CARDIOVERSION N/A 05/06/2020   Procedure: CARDIOVERSION;  Surgeon: Buford Dresser, MD;  Location: [facility] ENDOSCOPY;  Service: Cardiovascular;  Laterality: N/A;  . CATARACT EXTRACTION, BILATERAL    . CHOLECYSTECTOMY    . COLONOSCOPY WITH PROPOFOL N/A 03/05/2015   Procedure: COLONOSCOPY WITH PROPOFOL;  Surgeon: Carol Ada, MD;  Location: [facility] ENDOSCOPY;  Service: Endoscopy;  Laterality: N/A;  . DIAGNOSTIC LAPAROSCOPY    . ingrown hallux Left   . KNEE SURGERY    . LEFT HEART CATH AND CORONARY ANGIOGRAPHY N/A 12/31/2017   Procedure: LEFT HEART CATH AND CORONARY ANGIOGRAPHY;  Surgeon: Charolette Forward, MD;  Location: [facility];  Service: Cardiovascular;  Laterality: N/A;    Current Medications: Current Outpatient Medications on File Prior to Visit  Medication Sig  . Accu-Chek Softclix Lancets lancets Use as instructed to check blood sugar three times daily. E11.69  . albuterol (VENTOLIN HFA) 108 (90 Base) MCG/ACT inhaler Inhale 2 puffs into the lungs every 4 (four) hours as needed for wheezing or shortness of breath.   . Alcohol Swabs (B-D SINGLE USE SWABS REGULAR) PADS   . Blood Glucose Monitoring Suppl (ACCU-CHEK GUIDE ME) w/Device KIT Use as instructed to check blood sugar three times daily. E11.69  . dabigatran (PRADAXA)  150 MG CAPS capsule Take 1 capsule (150 mg total) by mouth 2 (two) times daily.  . digoxin (LANOXIN) 0.125 MG tablet Take 0.125 mg by mouth daily.   . Dulaglutide (TRULICITY) 1.5 MG/0.5ML SOPN Inject 1.5 mg into the skin once a week.  . DULoxetine (CYMBALTA) 60 MG capsule Take 1 capsule (60 mg total) by mouth daily.  Marland Kitchen EPINEPHrine 0.3 mg/0.3 mL IJ SOAJ injection Inject 0.3 mg into the muscle as needed for anaphylaxis.  . furosemide (LASIX) 40 MG tablet Take 1 tablet (40 mg total) by mouth 2 (two) times daily.  Marland Kitchen gabapentin  (NEURONTIN) 300 MG capsule   . HYDROcodone-acetaminophen (NORCO/VICODIN) 5-325 MG tablet Take 1 tablet by mouth 4 (four) times daily.  . insulin aspart (NOVOLOG) 100 UNIT/ML injection Inject 12 Units into the skin 3 (three) times daily before meals. For blood sugars 0-199 give 0 units of insulin, 201-250 give 4 units, 251-300 give 6 units, 301-350 give 8 units, 351-400 give 10 units,> 400 give 12 units and call M.D.  . Insulin Syringe-Needle U-100 (TRUEPLUS INSULIN SYRINGE) 31G X 5/16" 0.3 ML MISC Use to administer insulin every day.  Marland Kitchen LEVEMIR FLEXTOUCH 100 UNIT/ML FlexPen Inject into the skin.  Marland Kitchen metolazone (ZAROXOLYN) 5 MG tablet Take 1 tablet (5 mg total) by mouth daily.  . metoprolol succinate (TOPROL-XL) 100 MG 24 hr tablet Take 1 tablet (100 mg total) by mouth daily. Take with or immediately following a meal.  . Multiple Vitamins-Minerals (MULTIVITAMIN WOMEN PO) Take 1 tablet by mouth daily.   . nitroGLYCERIN (NITROSTAT) 0.4 MG SL tablet Place 1 tablet (0.4 mg total) under the tongue every 5 (five) minutes x 3 doses as needed for chest pain.  . pantoprazole (PROTONIX) 40 MG tablet Take 1 tablet (40 mg total) by mouth 2 (two) times daily.  . RESTASIS 0.05 % ophthalmic emulsion Place 1 drop into both eyes 2 (two) times daily.  . rosuvastatin (CRESTOR) 10 MG tablet Take 1 tablet (10 mg total) by mouth daily.  . TRUE METRIX BLOOD GLUCOSE TEST test strip USE AS INSTRUCTED TO CHECK BLOOD SUGAR DAILY.   No current facility-administered medications on file prior to visit.     Allergies:   Sulfa antibiotics   Social History   Tobacco Use  . Smoking status: Never Smoker  . Smokeless tobacco: Never Used  Vaping Use  . Vaping Use: Never used  Substance Use Topics  . Alcohol use: No  . Drug use: No    Family History: family history includes CAD in an other family member; Cancer in her father and mother; Hypertension in her brother, mother, and sister.  ROS:   Please see the history of  present illness.  Additional pertinent ROS otherwise unremarkable.  EKGs/Labs/Other Studies Reviewed:    The following studies were reviewed today: Echo 07/02/2018 - Left ventricle: The cavity size was normal. Systolic function was  mildly reduced. The estimated ejection fraction was in the range  of 45% to 50%. There is moderate hypokinesis of the  entireanteroseptal myocardium.  - Aortic valve: There was mild regurgitation.  - Mitral valve: Mildly dilated, calcified annulus. There was severe  regurgitation.  - Left atrium: The atrium was severely dilated.  - Right ventricle: The cavity size was mildly dilated. Wall  thickness was normal. Systolic function was mildly to moderately  reduced.  - Right atrium: The atrium was severely dilated.  - Tricuspid valve: There was severe regurgitation.  - Pulmonary arteries: Systolic pressure was mildly increased.  PA  peak pressure: 31 mm Hg (S).   Cath 12/31/2017  Prox LAD lesion is 10% stenosed.  Dist LM to Ost LAD lesion is 10% stenosed.  Ost Cx lesion is 15% stenosed.  The left ventricular systolic function is normal.  LV end diastolic pressure is normal.   EKG:  EKG is personally reviewed.  The ekg ordered today demonstrates atrial fibrillation, rate 108 bpm  Recent Labs: 12/23/2019: TSH 2.820 03/02/2020: B Natriuretic Peptide 43.7 03/08/2020: Magnesium 1.8 04/21/2020: ALT 17 05/03/2020: Platelets 236 05/06/2020: BUN 39; Creatinine, Ser 2.00; Hemoglobin 15.3; Potassium 3.6; Sodium 139  Recent Lipid Panel    Component Value Date/Time   CHOL 140 06/24/2020 1054   TRIG 102 06/24/2020 1054   HDL 46 06/24/2020 1054   CHOLHDL 3.0 06/24/2020 1054   CHOLHDL 3.1 12/30/2017 0436   VLDL 20 12/30/2017 0436   LDLCALC 75 06/24/2020 1054    Physical Exam:    VS:  BP 118/70   Pulse (!) 108   Ht 5\' 7"  (1.702 m)   Wt 276 lb (125.2 kg)   SpO2 94%   BMI 43.23 kg/m     Wt Readings from Last 3 Encounters:  09/27/20 272 lb  12.8 oz (123.7 kg)  09/08/20 276 lb (125.2 kg)  07/27/20 270 lb (122.5 kg)    GEN: Well nourished, well developed in no acute distress HEENT: Normal, moist mucous membranes NECK: No JVD CARDIAC: tachycardic, irregularly irregular rhythm, normal S1 and S2, no rubs or gallops. 2/6 holosystolic murmur. VASCULAR: Radial and DP pulses 2+ bilaterally. No carotid bruits RESPIRATORY:  Clear to auscultation without rales, wheezing or rhonchi  ABDOMEN: Soft, non-tender, non-distended MUSCULOSKELETAL:  Ambulates independently SKIN: Warm and dry, trace bilateral LE edema NEUROLOGIC:  Alert and oriented x 3. No focal neuro deficits noted. PSYCHIATRIC:  Normal affect   ASSESSMENT:    1. Longstanding persistent atrial fibrillation (HCC)   2. Medication management   3. Secondary hypercoagulable state (HCC)   4. Chronic combined systolic and diastolic heart failure (HCC)   5. NICM (nonischemic cardiomyopathy) (HCC)   6. Severe mitral regurgitation   7. Severe tricuspid regurgitation    PLAN:    Paroxysmal atrial fibrillation, now more longstanding persisten -awaiting tikosyn admission, limited due to Covid pandemic -CHA2DS2/VAS Stroke Risk Points=4, on dabigatran, no missed doses  Nonischemic cardiomyopathy, with chronic combined systolic and diastolic heart failure: last EF 45-50% -see prior med management -on digoxin, dulaglutide, furosemide, metolazone, metoprolol succinate -No room to add ACEI/ARB/ARNI/MRA currently due to renal function, last Cr 2 -will trial lasix BID and holding metolazone to see if this manages edema without worsening renal function -follow closely  History of obstructive sleep apnea, with CPAP at home: -reports she has been sleeping poorly, CPAP not working -recent sleep study did not diagnosis sleep apnea, but she is not sure she even slept during the study  Severe MR, severe TR: -seen on prior echo -MR likely exacerbating atrial stretch/afib -ideally would  like to get TEE in sinus rhythm to evaluate fully  Type II diabetes: -on insulin -also on dulaglutide (GLP1RA) -her GFR is <30, so SGLT2i is not recommended -on rosuvastatin 10 mg daily for diabetic dyslipidemia -per [facility], lipids 06/2020 show Tchol 140, HDL 46, LDL 75, TG 102 -would not add aspirin as she is on dabigatran  Strong family history of heart disease/heart failure: she is unclear of the etiology of this  Cardiac risk counseling and prevention recommendations: -recommend heart healthy/Mediterranean diet, with whole grains,  fruits, vegetable, fish, lean meats, nuts, and olive oil. Limit salt. -recommend moderate walking, 3-5 times/week for 30-50 minutes each session. Aim for at least 150 minutes.week. Goal should be pace of 3 miles/hours, or walking 1.5 miles in 30 minutes -recommend avoidance of tobacco products. Avoid excess alcohol. -Additional risk factor control:  -Weight: BMI 43 today. Would benefit from weight loss given morbid obesity with comorbidities -ASCVD risk score: The 10-year ASCVD risk score Mikey Bussing DC Brooke Bonito., et al., 2013) is: 15.1%   Values used to calculate the score:     Age: 55 years     Sex: Female     Is Non-Hispanic African American: Yes     Diabetic: Yes     Tobacco smoker: No     Systolic Blood Pressure: 789 mmHg     Is BP treated: Yes     HDL Cholesterol: 46 mg/dL     Total Cholesterol: 140 mg/dL    Plan for follow up: 2 weeks  Buford Dresser, MD, PhD, [facility]    Medication Adjustments/Labs and Tests Ordered: Current medicines are reviewed at length with the patient today.  Concerns regarding medicines are outlined above.  Orders Placed This Encounter  Procedures  . Basic metabolic panel  . Magnesium  . EKG 12-Lead   Meds ordered this encounter  Medications  . DISCONTD: metoprolol succinate (TOPROL-XL) 100 MG 24 hr tablet    Sig: Take 1 tablet (100 mg total) by mouth 2 (two) times daily. Take with or  immediately following a meal.    Dispense:  180 tablet    Refill:  3    Patient Instructions  Medication Instructions:  We are going to increase your metoprolol from one-100mg  pill daily to one-100 mg pills twice a day. I would take one in the morning and one at night.    I want to stop the metolazone. You will need to monitor your weight and swelling closely.   What I want you to watch for is if the current dose of lasix (furosemide) is making you urinate. If it is, then we will increase to lasix twice a day (morning and early afternoon). If the lasix dose does not make you urinate, then we need to double the dose from 40 mg to 80 mg at one time. We are looking to see what dose of lasix "flips the switch" and makes you urinate.  I will send a note to the afib clinic about when we can get a tikosyn admission set up.  *If you need a refill on your cardiac medications before your next appointment, please call your pharmacy*   Lab Work: Your physician recommends lab work today (BMP, MG)  If you have labs (blood work) drawn today and your tests are completely normal, you will receive your results only by: Marland Kitchen MyChart Message (if you have MyChart) OR . A paper copy in the mail If you have any lab test that is abnormal or we need to change your treatment, we will call you to review the results.   Testing/Procedures: None   Follow-Up: At [facility], you and your health needs are our priority.  As part of our continuing mission to provide you with exceptional heart care, we have created designated Provider Care Teams.  These Care Teams include your primary Cardiologist (physician) and Advanced Practice Providers (APPs -  Physician Assistants and Nurse Practitioners) who all work together to provide you with the care you need, when you  need it.  We recommend signing up for the patient portal called "MyChart".  Sign up information is provided on this After Visit Summary.  MyChart is used  to connect with patients for Virtual Visits (Telemedicine).  Patients are able to view lab/test results, encounter notes, upcoming appointments, etc.  Non-urgent messages can be sent to your provider as well.   To learn more about what you can do with MyChart, go to [email or web address].    Your next appointment:   2 week(s)  The format for your next appointment:   In Person  Provider:   Buford Dresser, MD      Signed, Buford Dresser, MD PhD 09/08/2020    [place]

## 2020-09-08 NOTE — Patient Instructions (Addendum)
Medication Instructions:  We are going to increase your metoprolol from one-100mg  pill daily to one-100 mg pills twice a day. I would take one in the morning and one at night.    I want to stop the metolazone. You will need to monitor your weight and swelling closely.   What I want you to watch for is if the current dose of lasix (furosemide) is making you urinate. If it is, then we will increase to lasix twice a day (morning and early afternoon). If the lasix dose does not make you urinate, then we need to double the dose from 40 mg to 80 mg at one time. We are looking to see what dose of lasix "flips the switch" and makes you urinate.  I will send a note to the afib clinic about when we can get a tikosyn admission set up.  *If you need a refill on your cardiac medications before your next appointment, please call your pharmacy*   Lab Work: Your physician recommends lab work today (BMP, MG)  If you have labs (blood work) drawn today and your tests are completely normal, you will receive your results only by: Marland Kitchen MyChart Message (if you have MyChart) OR . A paper copy in the mail If you have any lab test that is abnormal or we need to change your treatment, we will call you to review the results.   Testing/Procedures: None   Follow-Up: At [facility], you and your health needs are our priority.  As part of our continuing mission to provide you with exceptional heart care, we have created designated Provider Care Teams.  These Care Teams include your primary Cardiologist (physician) and Advanced Practice Providers (APPs -  Physician Assistants and Nurse Practitioners) who all work together to provide you with the care you need, when you need it.  We recommend signing up for the patient portal called "MyChart".  Sign up information is provided on this After Visit Summary.  MyChart is used to connect with patients for Virtual Visits (Telemedicine).  Patients are able to view lab/test  results, encounter notes, upcoming appointments, etc.  Non-urgent messages can be sent to your provider as well.   To learn more about what you can do with MyChart, go to [email or web address].    Your next appointment:   2 week(s)  The format for your next appointment:   In Person  Provider:   Buford Dresser, MD

## 2020-09-09 LAB — BASIC METABOLIC PANEL
BUN/Creatinine Ratio: 8 — ABNORMAL LOW (ref 12–28)
BUN: 14 mg/dL (ref 8–27)
CO2: 29 mmol/L (ref 20–29)
Calcium: 9.1 mg/dL (ref 8.7–10.3)
Chloride: 94 mmol/L — ABNORMAL LOW (ref 96–106)
Creatinine, Ser: 1.84 mg/dL — ABNORMAL HIGH (ref 0.57–1.00)
GFR calc Af Amer: 33 mL/min/{1.73_m2} — ABNORMAL LOW (ref >59)
GFR calc non Af Amer: 29 mL/min/{1.73_m2} — ABNORMAL LOW (ref >59)
Glucose: 425 mg/dL — ABNORMAL HIGH (ref 65–99)
Potassium: 3.7 mmol/L (ref 3.5–5.2)
Sodium: 137 mmol/L (ref 134–144)

## 2020-09-09 LAB — MAGNESIUM: Magnesium: 1.2 mg/dL — ABNORMAL LOW (ref 1.6–2.3)

## 2020-09-10 ENCOUNTER — Other Ambulatory Visit: Payer: Self-pay

## 2020-09-10 ENCOUNTER — Other Ambulatory Visit: Payer: Self-pay | Admitting: Cardiology

## 2020-09-10 ENCOUNTER — Encounter: Payer: Self-pay | Admitting: Podiatry

## 2020-09-10 ENCOUNTER — Ambulatory Visit (INDEPENDENT_AMBULATORY_CARE_PROVIDER_SITE_OTHER): Payer: Medicare Other | Admitting: Podiatry

## 2020-09-10 DIAGNOSIS — E1142 Type 2 diabetes mellitus with diabetic polyneuropathy: Secondary | ICD-10-CM

## 2020-09-10 DIAGNOSIS — M79675 Pain in left toe(s): Secondary | ICD-10-CM | POA: Diagnosis not present

## 2020-09-10 DIAGNOSIS — Z79899 Other long term (current) drug therapy: Secondary | ICD-10-CM

## 2020-09-10 DIAGNOSIS — B351 Tinea unguium: Secondary | ICD-10-CM

## 2020-09-10 DIAGNOSIS — L600 Ingrowing nail: Secondary | ICD-10-CM

## 2020-09-10 DIAGNOSIS — M79674 Pain in right toe(s): Secondary | ICD-10-CM | POA: Diagnosis not present

## 2020-09-10 NOTE — Progress Notes (Signed)
Subjective:  Patient ID: [name], female    DOB: 1955/11/22,  MRN: [id number]  65 y.o. female presents with preventative diabetic foot care and painful thick toenails that are difficult to trim. Pain interferes with ambulation. Aggravating factors include wearing enclosed shoe gear. Pain is relieved with periodic professional debridement..    Patient's blood sugar was 175 mg/dl this morning.  PCP: [name], NP and last visit was:   Review of Systems: Negative except as noted in the HPI.  Past Medical History:  Diagnosis Date  . Allergic rhinitis   . Arthritis   . Asthma   . Chest pain 12/27/2017  . Chronic diastolic CHF (congestive heart failure) ([place])   . COPD (chronic obstructive pulmonary disease) ([place])   . Depression   . DM (diabetes mellitus) ([place])   . DVT (deep vein thrombosis) in pregnancy   . Dysrhythmia    atrial fibrilation  . GERD (gastroesophageal reflux disease)   . Headache(784.0)   . HTN (hypertension)   . Hyperlipidemia   . Obesity   . Pneumonia 04/2018   RIGHT LOBE  . Shortness of breath   . Sleep apnea    compliant with CPAP   Past Surgical History:  Procedure Laterality Date  . ABDOMINAL HYSTERECTOMY    . CARDIOVERSION N/A 05/06/2020   Procedure: CARDIOVERSION;  Surgeon: Buford Dresser, MD;  Location: [facility] ENDOSCOPY;  Service: Cardiovascular;  Laterality: N/A;  . CATARACT EXTRACTION, BILATERAL    . CHOLECYSTECTOMY    . COLONOSCOPY WITH PROPOFOL N/A 03/05/2015   Procedure: COLONOSCOPY WITH PROPOFOL;  Surgeon: Carol Ada, MD;  Location: [facility] ENDOSCOPY;  Service: Endoscopy;  Laterality: N/A;  . DIAGNOSTIC LAPAROSCOPY    . ingrown hallux Left   . KNEE SURGERY    . LEFT HEART CATH AND CORONARY ANGIOGRAPHY N/A 12/31/2017   Procedure: LEFT HEART CATH AND CORONARY ANGIOGRAPHY;  Surgeon: Charolette Forward, MD;  Location: [facility] CV LAB;  Service: Cardiovascular;  Laterality: N/A;   Patient Active Problem List   Diagnosis Date  Noted  . Chronic combined systolic and diastolic heart failure ([place]) 05/06/2020  . NICM (nonischemic cardiomyopathy) ([place]) 04/01/2020  . Family history of heart disease 04/01/2020  . Severe mitral regurgitation 04/01/2020  . Severe tricuspid regurgitation 04/01/2020  . Closed right ankle fracture 09/24/2019  . DKA, type 2 ([place]) 09/24/2019  . HHNC (hyperglycemic hyperosmolar nonketotic coma) ([place]) 02/21/2019  . Hypokalemia 02/21/2019  . Acute kidney injury superimposed on CKD ([place]) 02/21/2019  . Acute on chronic left systolic heart failure ([place]) 07/01/2018  . Congestive heart failure with left ventricular diastolic dysfunction, acute ([place]) 07/01/2018  . Right upper lobe pneumonia 05/14/2018  . Atrial fibrillation with RVR ([place]) 05/14/2018  . Chronic atrial fibrillation ([place]) 12/26/2017  . Diastolic dysfunction 56/43/3295  . Diabetes mellitus type 2 in obese ([place]) 12/26/2017  . Nausea vomiting and diarrhea   . Chest pain 02/26/2012  . OSA (obstructive sleep apnea) 09/21/2011  . Hypoxia 09/21/2011  . Diabetes mellitus ([place]) 02/04/2010  . Morbid obesity ([place]) 02/04/2010  . Atrial fibrillation ([place]) 02/04/2010  . Gastroparesis 02/04/2010  . Acute gastroenteritis 02/04/2010    Current Outpatient Medications:  .  methylPREDNISolone (MEDROL DOSEPAK) 4 MG TBPK tablet, See admin instructions., Disp: , Rfl:  .  Accu-Chek Softclix Lancets lancets, Use as instructed to check blood sugar three times daily. E11.69, Disp: 100 each, Rfl: 12 .  albuterol (VENTOLIN HFA) 108 (90 Base) MCG/ACT inhaler, Inhale 2 puffs into the lungs every 4 (four)  hours as needed for wheezing or shortness of breath. , Disp: , Rfl:  .  Alcohol Swabs (B-D SINGLE USE SWABS REGULAR) PADS, , Disp: , Rfl:  .  Blood Glucose Monitoring Suppl (ACCU-CHEK GUIDE ME) w/Device KIT, Use as instructed to check blood sugar three times daily. E11.69, Disp: 1 kit, Rfl: 0 .  Blood Glucose Monitoring Suppl (ACCU-CHEK GUIDE) w/Device KIT,  USE AS INSTRUCTED TO CHECK BLOOD SUGAR THREE TIMES DAILY. E11.69, Disp: , Rfl:  .  dabigatran (PRADAXA) 150 MG CAPS capsule, Take 1 capsule (150 mg total) by mouth 2 (two) times daily., Disp: 60 capsule, Rfl: 2 .  Dabigatran Etexilate Mesylate (PRADAXA PO), , Disp: , Rfl:  .  digoxin (LANOXIN) 0.125 MG tablet, Take 0.125 mg by mouth daily. , Disp: , Rfl:  .  DIGOXIN PO, , Disp: , Rfl:  .  Dulaglutide (TRULICITY) 1.5 MG/0.5ML SOPN, Inject 1.5 mg into the skin once a week., Disp: 0.5 mL, Rfl: 3 .  DULoxetine (CYMBALTA) 60 MG capsule, Take 1 capsule (60 mg total) by mouth daily., Disp: 30 capsule, Rfl: 3 .  DULOXETINE HCL PO, , Disp: , Rfl:  .  EPINEPHrine 0.3 mg/0.3 mL IJ SOAJ injection, Inject 0.3 mg into the muscle as needed for anaphylaxis., Disp: 1 each, Rfl: 1 .  furosemide (LASIX) 40 MG tablet, Take 1 tablet (40 mg total) by mouth 2 (two) times daily., Disp: 180 tablet, Rfl: 3 .  FUROSEMIDE PO, , Disp: , Rfl:  .  gabapentin (NEURONTIN) 300 MG capsule, , Disp: , Rfl:  .  GABAPENTIN PO, , Disp: , Rfl:  .  HYDROcodone-acetaminophen (NORCO/VICODIN) 5-325 MG tablet, Take 1 tablet by mouth 4 (four) times daily., Disp: , Rfl:  .  Insulin Aspart (NOVOLOG FLEXPEN [place]), , Disp: , Rfl:  .  insulin aspart (NOVOLOG) 100 UNIT/ML injection, Inject 12 Units into the skin 3 (three) times daily before meals. For blood sugars 0-199 give 0 units of insulin, 201-250 give 4 units, 251-300 give 6 units, 301-350 give 8 units, 351-400 give 10 units,> 400 give 12 units and call M.D., Disp: 10 mL, Rfl: 3 .  Insulin Detemir (LEVEMIR [place]), , Disp: , Rfl:  .  Insulin Syringe-Needle U-100 (TRUEPLUS INSULIN SYRINGE) 31G X 5/16" 0.3 ML MISC, Use to administer insulin every day., Disp: 100 each, Rfl: 11 .  LEVEMIR FLEXTOUCH 100 UNIT/ML FlexPen, Inject into the skin., Disp: , Rfl:  .  losartan (COZAAR) 25 MG tablet, , Disp: , Rfl:  .  METOLAZONE PO, , Disp: , Rfl:  .  metoprolol succinate (TOPROL-XL) 100 MG 24 hr tablet, Take 1  tablet (100 mg total) by mouth 2 (two) times daily. Take with or immediately following a meal., Disp: 180 tablet, Rfl: 3 .  METOPROLOL SUCCINATE ER PO, , Disp: , Rfl:  .  metoprolol tartrate (LOPRESSOR) 25 MG tablet, Take by mouth., Disp: , Rfl:  .  Multiple Vitamins-Minerals (MULTIVITAMIN WOMEN PO), Take 1 tablet by mouth daily. , Disp: , Rfl:  .  nitroGLYCERIN (NITROSTAT) 0.4 MG SL tablet, Place 1 tablet (0.4 mg total) under the tongue every 5 (five) minutes x 3 doses as needed for chest pain., Disp: 25 tablet, Rfl: 12 .  NOVOLOG FLEXPEN 100 UNIT/ML FlexPen, Inject into the skin., Disp: , Rfl:  .  pantoprazole (PROTONIX) 40 MG tablet, Take 1 tablet (40 mg total) by mouth 2 (two) times daily., Disp: 180 tablet, Rfl: 1 .  PANTOPRAZOLE SODIUM PO, , Disp: , Rfl:  .  RESTASIS 0.05 % ophthalmic emulsion, Place 1 drop into both eyes 2 (two) times daily., Disp: , Rfl: 12 .  rosuvastatin (CRESTOR) 10 MG tablet, Take 1 tablet (10 mg total) by mouth daily., Disp: 90 tablet, Rfl: 1 .  TRUE METRIX BLOOD GLUCOSE TEST test strip, USE AS INSTRUCTED TO CHECK BLOOD SUGAR DAILY., Disp: 100 strip, Rfl: 11 Allergies  Allergen Reactions  . Sulfa Antibiotics Itching  . Other     Other reaction(s): Unknown   Social History   Tobacco Use  Smoking Status Never Smoker  Smokeless Tobacco Never Used    Objective:  There were no vitals filed for this visit. Constitutional Patient is a pleasant 65 y.o. African American female morbidly obese in NAD. AAO x 3.  Vascular Capillary refill time to digits immediate b/l. Palpable DP pulse(s) b/l lower extremities Nonpalpable PT pulse(s) b/l lower extremities. Pedal hair absent. Lower extremity skin temperature gradient within normal limits. No pain with calf compression b/l. Trace edema noted b/l lower extremities. Evidence of chronic venous insufficiency b/l lower extremities. No cyanosis or clubbing noted.  Neurologic Normal speech. Protective sensation diminished with 10g  monofilament b/l. Vibratory sensation diminished b/l.  Dermatologic Pedal skin with normal turgor, texture and tone bilaterally. No open wounds bilaterally. No interdigital macerations bilaterally. Toenails 1-5 b/l elongated, discolored, dystrophic, thickened, crumbly with subungual debris and tenderness to dorsal palpation. Incurvated nailplate bilateral border(s) L hallux and R hallux.  Nail border hypertrophy minimal. There is tenderness to palpation. Sign(s) of infection: no clinical signs of infection noted on examination today.. Subacute blood blister subungually right great toe with <1 cc dark heme. No erythema, no edema, no fluctuance.  Orthopedic: Normal muscle strength 5/5 to all lower extremity muscle groups bilaterally. No pain crepitus or joint limitation noted with ROM b/l. No gross bony deformities bilaterally. Utilizes cane for ambulation assistance.   Hemoglobin A1C Latest Ref Rng & Units 06/24/2020 03/02/2020 12/23/2019 09/25/2019  HGBA1C 4.0 - 5.6 % 9.8(A) 10.0(H) 10.0(A) 12.7(H)  Some recent data might be hidden       Assessment:   1. Pain due to onychomycosis of toenails of both feet   2. Ingrown toenail without infection   3. Diabetic peripheral neuropathy associated with type 2 diabetes mellitus ([place])    Plan:  Patient was evaluated and treated and all questions answered.  Onychomycosis with pain -Nails palliatively debridement as below. -Educated on self-care  Procedure: Nail Debridement Rationale: Pain Type of Debridement: manual, sharp debridement. Instrumentation: Nail nipper, rotary burr. Number of Nails: 10  -Examined patient. -Continue diabetic foot care principles. -Patient to continue soft, supportive shoe gear daily. -Patient advised to avoid pedicures. She may get her toenails polished. She related understanding. -Toenails 1-5 b/l were debrided in length and girth with sterile nail nippers and dremel without iatrogenic bleeding.  -Evacuated subungual  blood blister. Cleansed right hallux with alcohol. Triple antibiotic ointment. -Offending nail border debrided and curretaged L hallux and R hallux utilizing sterile nail nipper and currette. Border(s) cleansed with alcohol and triple antibiotic ointment applied. Patient instructed to apply triple antibiotic ointment to L hallux and R hallux once daily for 7 days. She was also instructed to apply Vaseline Petroleum around toenails once daily. -Patient to report any pedal injuries to medical professional immediately. -Patient/POA to call should there be question/concern in the interim.  Return in about 3 months (around 12/09/2020).  Marzetta Board, DPM

## 2020-09-12 ENCOUNTER — Other Ambulatory Visit (INDEPENDENT_AMBULATORY_CARE_PROVIDER_SITE_OTHER): Payer: Self-pay | Admitting: Primary Care

## 2020-09-13 ENCOUNTER — Other Ambulatory Visit (INDEPENDENT_AMBULATORY_CARE_PROVIDER_SITE_OTHER): Payer: Self-pay | Admitting: Primary Care

## 2020-09-13 DIAGNOSIS — E1169 Type 2 diabetes mellitus with other specified complication: Secondary | ICD-10-CM

## 2020-09-13 DIAGNOSIS — E669 Obesity, unspecified: Secondary | ICD-10-CM

## 2020-09-16 ENCOUNTER — Other Ambulatory Visit (INDEPENDENT_AMBULATORY_CARE_PROVIDER_SITE_OTHER): Payer: Self-pay | Admitting: Primary Care

## 2020-09-16 ENCOUNTER — Telehealth (INDEPENDENT_AMBULATORY_CARE_PROVIDER_SITE_OTHER): Payer: Self-pay | Admitting: Primary Care

## 2020-09-16 NOTE — Telephone Encounter (Signed)
Pt called saying she needs a refill on her Gabapentin 300.  I see the refill request but it says provider not at this practice.   She said she uses [facility]ire as her provider.  She uses [facility]  Pt is completely out for 4 days and is having nerve pain in her feet.

## 2020-09-16 NOTE — Telephone Encounter (Signed)
Medication: gabapentin (NEURONTIN) 300 MG capsule [[id number]]   Has the patient contacted their pharmacy? YES  (Agent: If no, request that the patient contact the pharmacy for the refill.) (Agent: If yes, when and what did the pharmacy advise?)  Preferred Pharmacy (with phone number or street name): [id number] [place], [address] [address] Phone: [phone] Fax: [phone] Hours: Not open 24 hours    Agent: Please be advised that RX refills may take up to 3 business days. We ask that you follow-up with your pharmacy.

## 2020-09-16 NOTE — Telephone Encounter (Signed)
Sent to PCP ?

## 2020-09-16 NOTE — Telephone Encounter (Signed)
Requested medication (s) are due for refill today: unsure  Requested medication (s) are on the active medication list: yes  Last refill:  07/02/20 historic med  Future visit scheduled: yes  Notes to clinic: Historic med on medication list.    Requested Prescriptions  Pending Prescriptions Disp Refills   gabapentin (NEURONTIN) 300 MG capsule        Neurology: Anticonvulsants - gabapentin Passed - 09/16/2020  1:56 PM      Passed - Valid encounter within last 12 months    Recent Outpatient Visits           2 months ago Type 2 diabetes mellitus without complication, without long-term current use of insulin ([place])   [place], Michelle P, NP   4 months ago Diabetes mellitus type 2 in obese [facility])   [place], Annie Main L, RPH-CPP   4 months ago Diabetes mellitus type 2 in obese [facility])   [place], RPH-CPP   5 months ago Hospital discharge follow-up   [place], [place], NP   6 months ago Type 2 diabetes mellitus without complication, without long-term current use of insulin ([place])   [place], [place], NP       Future Appointments             In 1 week Oletta Lamas, [place] Cage, NP [place]   In 1 week Buford Dresser, MD [place], [facility]

## 2020-09-20 ENCOUNTER — Other Ambulatory Visit (INDEPENDENT_AMBULATORY_CARE_PROVIDER_SITE_OTHER): Payer: Self-pay | Admitting: Primary Care

## 2020-09-21 ENCOUNTER — Other Ambulatory Visit (INDEPENDENT_AMBULATORY_CARE_PROVIDER_SITE_OTHER): Payer: Self-pay | Admitting: Primary Care

## 2020-09-21 DIAGNOSIS — E1142 Type 2 diabetes mellitus with diabetic polyneuropathy: Secondary | ICD-10-CM

## 2020-09-21 MED ORDER — GABAPENTIN 300 MG PO CAPS: 300.0000 mg | ORAL_CAPSULE | Freq: Two times a day (BID) | ORAL | 1 refills | Status: DC

## 2020-09-24 ENCOUNTER — Ambulatory Visit (INDEPENDENT_AMBULATORY_CARE_PROVIDER_SITE_OTHER): Payer: Medicare Other | Admitting: Primary Care

## 2020-09-24 ENCOUNTER — Encounter (INDEPENDENT_AMBULATORY_CARE_PROVIDER_SITE_OTHER): Payer: Self-pay | Admitting: Primary Care

## 2020-09-24 ENCOUNTER — Other Ambulatory Visit: Payer: Self-pay

## 2020-09-24 VITALS — BP 117/70 | HR 74 | Temp 97.5°F | Resp 16

## 2020-09-24 DIAGNOSIS — Z760 Encounter for issue of repeat prescription: Secondary | ICD-10-CM

## 2020-09-24 DIAGNOSIS — F32A Depression, unspecified: Secondary | ICD-10-CM

## 2020-09-24 DIAGNOSIS — E1142 Type 2 diabetes mellitus with diabetic polyneuropathy: Secondary | ICD-10-CM

## 2020-09-24 DIAGNOSIS — E119 Type 2 diabetes mellitus without complications: Secondary | ICD-10-CM

## 2020-09-24 DIAGNOSIS — E669 Obesity, unspecified: Secondary | ICD-10-CM | POA: Diagnosis not present

## 2020-09-24 DIAGNOSIS — E1169 Type 2 diabetes mellitus with other specified complication: Secondary | ICD-10-CM | POA: Diagnosis not present

## 2020-09-24 LAB — POCT GLYCOSYLATED HEMOGLOBIN (HGB A1C): HbA1c, POC (controlled diabetic range): 9.9 % — AB (ref 0.0–7.0)

## 2020-09-24 LAB — GLUCOSE, POCT (MANUAL RESULT ENTRY): POC Glucose: 256 mg/dl — AB (ref 70–99)

## 2020-09-24 MED ORDER — GABAPENTIN 300 MG PO CAPS: 600.0000 mg | ORAL_CAPSULE | Freq: Two times a day (BID) | ORAL | 1 refills | Status: DC

## 2020-09-24 MED ORDER — TRULICITY 1.5 MG/0.5ML ~~LOC~~ SOAJ: 1.5000 mg | SUBCUTANEOUS | 3 refills | Status: DC

## 2020-09-24 MED ORDER — DULOXETINE HCL 60 MG PO CPEP: 60.0000 mg | ORAL_CAPSULE | Freq: Every day | ORAL | 3 refills | Status: DC

## 2020-09-24 MED ORDER — METFORMIN HCL 1000 MG PO TABS: 1000.0000 mg | ORAL_TABLET | Freq: Two times a day (BID) | ORAL | 3 refills | Status: DC

## 2020-09-24 MED ORDER — ROSUVASTATIN CALCIUM 10 MG PO TABS: 10.0000 mg | ORAL_TABLET | Freq: Every day | ORAL | 1 refills | Status: DC

## 2020-09-24 NOTE — Patient Instructions (Signed)
Complications from uncontrolled diabetes -diabetic retinopathy leading to blindness, diabetic nephropathy leading to dialysis, decrease in circulation decrease in sores or wound healing which may lead to amputations and increase of heart attack and stroke

## 2020-09-24 NOTE — Progress Notes (Signed)
Established Patient Office Visit  Subjective:  Patient ID: [name], female    DOB: 07-Oct-1955  Age: 65 y.o. MRN: [id number]  CC: No chief complaint on file.   HPI Belicia Difatta presents for management of type 2 diabetes (uncontrolled) Hypoglycemic episodes:no( but BS was 45) Polydipsia/polyuria: no Visual disturbance: no, Chest pain: no, Paresthesias: yes, Glucose Monitoring: yes, Accucheck frequency: BID, Fasting glucose: 65-175 . Blood pressure well controlled followed by cardiology   Past Medical History:  Diagnosis Date  . Allergic rhinitis   . Arthritis   . Asthma   . Chest pain 12/27/2017  . Chronic diastolic CHF (congestive heart failure) ([place])   . COPD (chronic obstructive pulmonary disease) ([place])   . Depression   . DM (diabetes mellitus) ([place])   . DVT (deep vein thrombosis) in pregnancy   . Dysrhythmia    atrial fibrilation  . GERD (gastroesophageal reflux disease)   . Headache(784.0)   . HTN (hypertension)   . Hyperlipidemia   . Obesity   . Pneumonia 04/2018   RIGHT LOBE  . Shortness of breath   . Sleep apnea    compliant with CPAP    Past Surgical History:  Procedure Laterality Date  . ABDOMINAL HYSTERECTOMY    . CARDIOVERSION N/A 05/06/2020   Procedure: CARDIOVERSION;  Surgeon: Buford Dresser, MD;  Location: [facility] ENDOSCOPY;  Service: Cardiovascular;  Laterality: N/A;  . CATARACT EXTRACTION, BILATERAL    . CHOLECYSTECTOMY    . COLONOSCOPY WITH PROPOFOL N/A 03/05/2015   Procedure: COLONOSCOPY WITH PROPOFOL;  Surgeon: Carol Ada, MD;  Location: [facility] ENDOSCOPY;  Service: Endoscopy;  Laterality: N/A;  . DIAGNOSTIC LAPAROSCOPY    . ingrown hallux Left   . KNEE SURGERY    . LEFT HEART CATH AND CORONARY ANGIOGRAPHY N/A 12/31/2017   Procedure: LEFT HEART CATH AND CORONARY ANGIOGRAPHY;  Surgeon: Charolette Forward, MD;  Location: [facility] LAB;  Service: Cardiovascular;  Laterality: N/A;    Family History  Problem Relation Age of  Onset  . Cancer Mother   . Hypertension Mother   . Cancer Father   . CAD Other   . Hypertension Sister   . Hypertension Brother     Social History   Socioeconomic History  . Marital status: Widowed    Spouse name: Not on file  . Number of children: Not on file  . Years of education: Not on file  . Highest education level: Not on file  Occupational History  . Not on file  Tobacco Use  . Smoking status: Never Smoker  . Smokeless tobacco: Never Used  Vaping Use  . Vaping Use: Never used  Substance and Sexual Activity  . Alcohol use: No  . Drug use: No  . Sexual activity: Not on file  Other Topics Concern  . Not on file  Social History Narrative   Pt lives in [place] with spouse.  2 grown children.   Previously worked in [employer] at [employer].  Now on disability   Social Determinants of Health   Financial Resource Strain: Not on file  Food Insecurity: Not on file  Transportation Needs: Not on file  Physical Activity: Not on file  Stress: Not on file  Social Connections: Not on file  Intimate Partner Violence: Not on file    Outpatient Medications Prior to Visit  Medication Sig Dispense Refill  . albuterol (VENTOLIN HFA) 108 (90 Base) MCG/ACT inhaler Inhale 2 puffs into the lungs every 4 (four) hours as needed for wheezing or  shortness of breath.     . dabigatran (PRADAXA) 150 MG CAPS capsule Take 1 capsule (150 mg total) by mouth 2 (two) times daily. 60 capsule 2  . digoxin (LANOXIN) 0.125 MG tablet Take 0.125 mg by mouth daily.     Marland Kitchen EPINEPHrine 0.3 mg/0.3 mL IJ SOAJ injection Inject 0.3 mg into the muscle as needed for anaphylaxis. 1 each 1  . furosemide (LASIX) 40 MG tablet Take 1 tablet (40 mg total) by mouth 2 (two) times daily. 180 tablet 3  . insulin aspart (NOVOLOG) 100 UNIT/ML injection Inject 12 Units into the skin 3 (three) times daily before meals. For blood sugars 0-199 give 0 units of insulin, 201-250 give 4 units, 251-300 give 6 units, 301-350 give 8  units, 351-400 give 10 units,> 400 give 12 units and call M.D. 10 mL 3  . METOPROLOL SUCCINATE ER PO 100 mg daily.    . Multiple Vitamins-Minerals (MULTIVITAMIN WOMEN PO) Take 1 tablet by mouth daily.     . nitroGLYCERIN (NITROSTAT) 0.4 MG SL tablet Place 1 tablet (0.4 mg total) under the tongue every 5 (five) minutes x 3 doses as needed for chest pain. 25 tablet 12  . pantoprazole (PROTONIX) 40 MG tablet Take 1 tablet (40 mg total) by mouth 2 (two) times daily. 180 tablet 1  . Dabigatran Etexilate Mesylate (PRADAXA PO)     . DIGOXIN PO     . Dulaglutide (TRULICITY) 1.5 AS/3.4HD SOPN Inject 1.5 mg into the skin once a week. 0.5 mL 3  . DULoxetine (CYMBALTA) 60 MG capsule Take 1 capsule (60 mg total) by mouth daily. 30 capsule 3  . gabapentin (NEURONTIN) 300 MG capsule Take 1 capsule (300 mg total) by mouth 2 (two) times daily. 180 capsule 1  . rosuvastatin (CRESTOR) 10 MG tablet Take 1 tablet (10 mg total) by mouth daily. 90 tablet 1  . Accu-Chek Softclix Lancets lancets Use as instructed to check blood sugar three times daily. E11.69 100 each 12  . Alcohol Swabs (B-D SINGLE USE SWABS REGULAR) PADS     . Blood Glucose Monitoring Suppl (ACCU-CHEK GUIDE ME) w/Device KIT Use as instructed to check blood sugar three times daily. E11.69 1 kit 0  . Blood Glucose Monitoring Suppl (ACCU-CHEK GUIDE) w/Device KIT USE AS INSTRUCTED TO CHECK BLOOD SUGAR THREE TIMES DAILY. E11.69    . NOVOLOG FLEXPEN 100 UNIT/ML FlexPen Inject into the skin.    Marland Kitchen RESTASIS 0.05 % ophthalmic emulsion Place 1 drop into both eyes 2 (two) times daily.  12  . TRUE METRIX BLOOD GLUCOSE TEST test strip USE AS INSTRUCTED TO CHECK BLOOD SUGAR DAILY. 100 strip 11  . DULOXETINE HCL PO     . FUROSEMIDE PO     . HYDROcodone-acetaminophen (NORCO/VICODIN) 5-325 MG tablet Take 1 tablet by mouth 4 (four) times daily. (Patient not taking: Reported on 09/24/2020)    . Insulin Aspart (NOVOLOG FLEXPEN [place])     . Insulin Detemir (LEVEMIR [place])   (Patient not taking: Reported on 09/24/2020)    . Insulin Syringe-Needle U-100 (TRUEPLUS INSULIN SYRINGE) 31G X 5/16" 0.3 ML MISC Use to administer insulin every day. (Patient not taking: Reported on 09/24/2020) 100 each 11  . LEVEMIR FLEXTOUCH 100 UNIT/ML FlexPen Inject into the skin. (Patient not taking: Reported on 09/24/2020)    . losartan (COZAAR) 25 MG tablet  (Patient not taking: Reported on 09/24/2020)    . methylPREDNISolone (MEDROL DOSEPAK) 4 MG TBPK tablet See admin instructions. (Patient not taking: Reported  on 09/24/2020)    . METOLAZONE PO  (Patient not taking: Reported on 09/24/2020)    . metoprolol succinate (TOPROL-XL) 100 MG 24 hr tablet Take 1 tablet (100 mg total) by mouth 2 (two) times daily. Take with or immediately following a meal. 180 tablet 3  . metoprolol tartrate (LOPRESSOR) 25 MG tablet Take by mouth. (Patient not taking: Reported on 09/24/2020)    . PANTOPRAZOLE SODIUM PO      No facility-administered medications prior to visit.    Allergies  Allergen Reactions  . Sulfa Antibiotics Itching  . Other     Other reaction(s): Unknown    ROS Review of Systems  Respiratory: Positive for shortness of breath.        Exertion   Cardiovascular: Positive for leg swelling.  Musculoskeletal: Positive for gait problem.  Skin:       Dry skin   Neurological: Positive for dizziness and light-headedness.       When she first wakes up in the morning /positional   Psychiatric/Behavioral: Positive for sleep disturbance. The patient is nervous/anxious.        Depression   All other systems reviewed and are negative.     Objective:    Physical Exam Vitals reviewed.  Constitutional:      Appearance: She is obese.     Comments: Morbid   HENT:     Head: Normocephalic.     Right Ear: Tympanic membrane and external ear normal.     Left Ear: Tympanic membrane and external ear normal.     Nose: Nose normal.  Eyes:     Extraocular Movements: Extraocular movements intact.      Pupils: Pupils are equal, round, and reactive to light.  Cardiovascular:     Rate and Rhythm: Normal rate and regular rhythm.  Pulmonary:     Effort: Pulmonary effort is normal.     Breath sounds: Normal breath sounds.  Abdominal:     General: Bowel sounds are normal. There is distension.     Palpations: Abdomen is soft.  Musculoskeletal:        General: Normal range of motion.     Cervical back: Normal range of motion and neck supple.     Comments: Unstable gait   Skin:    General: Skin is dry.  Neurological:     Mental Status: She is alert and oriented to person, place, and time.  Psychiatric:        Mood and Affect: Mood normal.        Behavior: Behavior normal.        Thought Content: Thought content normal.        Judgment: Judgment normal.     BP 117/70   Pulse 74   Temp (!) 97.5 F (36.4 C)   Resp 16   SpO2 96%  Wt Readings from Last 3 Encounters:  09/27/20 272 lb 12.8 oz (123.7 kg)  09/08/20 276 lb (125.2 kg)  07/27/20 270 lb (122.5 kg)     Health Maintenance Due  Topic Date Due  . OPHTHALMOLOGY EXAM  Never done  . PAP SMEAR-Modifier  Never done  . MAMMOGRAM  Never done  . COVID-19 Vaccine (3 - Booster for Pfizer series) 06/04/2020    There are no preventive care reminders to display for this patient.  Lab Results  Component Value Date   TSH 2.820 12/23/2019   Lab Results  Component Value Date   WBC 7.7 05/03/2020   HGB 15.3 (H) 05/06/2020  HCT 45.0 05/06/2020   MCV 86 05/03/2020   PLT 236 05/03/2020   Lab Results  Component Value Date   NA 137 09/08/2020   K 3.7 09/08/2020   CO2 29 09/08/2020   GLUCOSE 425 (H) 09/08/2020   BUN 14 09/08/2020   CREATININE 1.84 (H) 09/08/2020   BILITOT 0.3 04/21/2020   ALKPHOS 123 (H) 04/21/2020   AST 20 04/21/2020   ALT 17 04/21/2020   PROT 8.0 04/21/2020   ALBUMIN 4.0 04/21/2020   CALCIUM 9.1 09/08/2020   ANIONGAP 6 03/08/2020   Lab Results  Component Value Date   CHOL 140 06/24/2020   Lab  Results  Component Value Date   HDL 46 06/24/2020   Lab Results  Component Value Date   LDLCALC 75 06/24/2020   Lab Results  Component Value Date   TRIG 102 06/24/2020   Lab Results  Component Value Date   CHOLHDL 3.0 06/24/2020   Lab Results  Component Value Date   HGBA1C 9.9 (A) 09/24/2020      Assessment & Plan:  Diagnoses and all orders for this visit:  Diabetes mellitus type 2 in obese ([place])  Complications from uncontrolled diabetes -diabetic retinopathy leading to blindness, diabetic nephropathy leading to dialysis, decrease in circulation decrease in sores or wound healing which may lead to amputations and increase of heart attack and stroke -     Glucose (CBG) -     HgB A1c -     rosuvastatin (CRESTOR) 10 MG tablet; Take 1 tablet (10 mg total) by mouth daily. -     Ambulatory referral to Ophthalmology  Diabetic polyneuropathy associated with type 2 diabetes mellitus ([place]) -     gabapentin (NEURONTIN) 300 MG capsule; Take 2 capsules (600 mg total) by mouth 2 (two) times daily. -     DULoxetine (CYMBALTA) 60 MG capsule; Take 1 capsule (60 mg total) by mouth daily.  Depression, unspecified depression type PHQ only 4 but niece is dying with 4 kids would like to take with CSW -     DULoxetine (CYMBALTA) 60 MG capsule; Take 1 capsule (60 mg total) by mouth daily.  Medication refill -     DULoxetine (CYMBALTA) 60 MG capsule; Take 1 capsule (60 mg total) by mouth daily. -     Dulaglutide (TRULICITY) 1.5 OM/3.5DH SOPN; Inject 1.5 mg into the skin once a week.  Encounter for diabetic foot exam ([place]) Completed    Meds ordered this encounter  Medications  . gabapentin (NEURONTIN) 300 MG capsule    Sig: Take 2 capsules (600 mg total) by mouth 2 (two) times daily.    Dispense:  360 capsule    Refill:  1  . DULoxetine (CYMBALTA) 60 MG capsule    Sig: Take 1 capsule (60 mg total) by mouth daily.    Dispense:  90 capsule    Refill:  3  . rosuvastatin (CRESTOR) 10 MG  tablet    Sig: Take 1 tablet (10 mg total) by mouth daily.    Dispense:  90 tablet    Refill:  1  . Dulaglutide (TRULICITY) 1.5 RC/1.6LA SOPN    Sig: Inject 1.5 mg into the skin once a week.    Dispense:  0.5 mL    Refill:  3  . DISCONTD: metFORMIN (GLUCOPHAGE) 1000 MG tablet    Sig: Take 1 tablet (1,000 mg total) by mouth 2 (two) times daily with a meal.    Dispense:  180 tablet    Refill:  3    Follow-up: Return for first with CSW than f/u PCP DM 3 months .    [name], NP

## 2020-09-24 NOTE — Progress Notes (Signed)
Concerns with pain in chest. Has cardiologist F/u DM and HTN

## 2020-09-26 NOTE — Progress Notes (Signed)
Virtual Visit via Telephone Note   This visit type was conducted due to national recommendations for restrictions regarding the COVID-19 Pandemic (e.g. social distancing) in an effort to limit this patient's exposure and mitigate transmission in our community.  Due to her co-morbid illnesses, this patient is at least at moderate risk for complications without adequate follow up.  This format is felt to be most appropriate for this patient at this time.  The patient did not have access to video technology/had technical difficulties with video requiring transitioning to audio format only (telephone).  All issues noted in this document were discussed and addressed.  No physical exam could be performed with this format.  Please refer to the patient's chart for her  consent to telehealth for [facility].   The patient was identified using 2 identifiers.  Patient Location: Home Provider Location: Office/Clinic  Date:  09/27/2020   ID:  [name], DOB Jan 23, 1956, MRN [id number]  PCP:  [name], NP  Cardiologist:  Buford Dresser, MD  Referring MD: [name], NP   CC: follow up  History of Present Illness:    [name] is a 65 y.o. female with a hx of atrial fibrillation, chronic systolic and diastolic heart failure, type II diabetes who is seen for follow up today. I initially saw her 03/31/20 as a new consult at the request of [name], NP for the evaluation and management of atrial fibrillation, heart failure.  Today: Still struggling with dyspnea on exertion. No LE edema until recently. Started metformin several days ago, had diarrhea, swelling and tightness in her legs. Since stopping the metformin, these have improved. Awaiting call from her PCP's office about next steps re: her diabetes.  Urinating well, weights have been stable between 272-275 lbs except for the day after metformin, when she was 282 lbs. Has come back to  baseline.   BP numbers have generally been well controlled. Have been a little elevated over the weekend due to the discomfort with metformin.  Still awaiting tikosyn admission due to Covid spike. We discussed that ideally would do TEE once in regular rhythm.  Denies chest pain. No PND, orthopnea. No syncope or palpitations.  Past Medical History:  Diagnosis Date  . Allergic rhinitis   . Arthritis   . Asthma   . Chest pain 12/27/2017  . Chronic diastolic CHF (congestive heart failure) ([place])   . COPD (chronic obstructive pulmonary disease) ([place])   . Depression   . DM (diabetes mellitus) ([place])   . DVT (deep vein thrombosis) in pregnancy   . Dysrhythmia    atrial fibrilation  . GERD (gastroesophageal reflux disease)   . Headache(784.0)   . HTN (hypertension)   . Hyperlipidemia   . Obesity   . Pneumonia 04/2018   RIGHT LOBE  . Shortness of breath   . Sleep apnea    compliant with CPAP    Past Surgical History:  Procedure Laterality Date  . ABDOMINAL HYSTERECTOMY    . CARDIOVERSION N/A 05/06/2020   Procedure: CARDIOVERSION;  Surgeon: Buford Dresser, MD;  Location[facility] ENDOSCOPY;  Service: Cardiovascular;  Laterality: N/A;  . CATARACT EXTRACTION, BILATERAL    . CHOLECYSTECTOMY    . COLONOSCOPY WITH PROPOFOL N/A 03/05/2015   Procedure: COLONOSCOPY WITH PROPOFOL;  Surgeon: Carol Ada, MD;  Location: [facility] ENDOSCOPY;  Service: Endoscopy;  Laterality: N/A;  . DIAGNOSTIC LAPAROSCOPY    . ingrown hallux Left   . KNEE SURGERY    . LEFT HEART  CATH AND CORONARY ANGIOGRAPHY N/A 12/31/2017   Procedure: LEFT HEART CATH AND CORONARY ANGIOGRAPHY;  Surgeon: Charolette Forward, MD;  Location: [facility] CV LAB;  Service: Cardiovascular;  Laterality: N/A;    Current Medications: Current Outpatient Medications on File Prior to Visit  Medication Sig  . Accu-Chek Softclix Lancets lancets Use as instructed to check blood sugar three times daily. E11.69  . albuterol (VENTOLIN HFA) 108 (90  Base) MCG/ACT inhaler Inhale 2 puffs into the lungs every 4 (four) hours as needed for wheezing or shortness of breath.   . Alcohol Swabs (B-D SINGLE USE SWABS REGULAR) PADS   . Blood Glucose Monitoring Suppl (ACCU-CHEK GUIDE ME) w/Device KIT Use as instructed to check blood sugar three times daily. E11.69  . Blood Glucose Monitoring Suppl (ACCU-CHEK GUIDE) w/Device KIT USE AS INSTRUCTED TO CHECK BLOOD SUGAR THREE TIMES DAILY. E11.69  . dabigatran (PRADAXA) 150 MG CAPS capsule Take 1 capsule (150 mg total) by mouth 2 (two) times daily.  . digoxin (LANOXIN) 0.125 MG tablet Take 0.125 mg by mouth daily.   . Dulaglutide (TRULICITY) 1.5 FK/8.1EX SOPN Inject 1.5 mg into the skin once a week.  . DULoxetine (CYMBALTA) 60 MG capsule Take 1 capsule (60 mg total) by mouth daily.  Marland Kitchen EPINEPHrine 0.3 mg/0.3 mL IJ SOAJ injection Inject 0.3 mg into the muscle as needed for anaphylaxis.  . furosemide (LASIX) 40 MG tablet Take 1 tablet (40 mg total) by mouth 2 (two) times daily.  Marland Kitchen gabapentin (NEURONTIN) 300 MG capsule Take 2 capsules (600 mg total) by mouth 2 (two) times daily.  . insulin aspart (NOVOLOG) 100 UNIT/ML injection Inject 12 Units into the skin 3 (three) times daily before meals. For blood sugars 0-199 give 0 units of insulin, 201-250 give 4 units, 251-300 give 6 units, 301-350 give 8 units, 351-400 give 10 units,> 400 give 12 units and call M.D.  . METOPROLOL SUCCINATE ER PO 100 mg daily.  . Multiple Vitamins-Minerals (MULTIVITAMIN WOMEN PO) Take 1 tablet by mouth daily.   . nitroGLYCERIN (NITROSTAT) 0.4 MG SL tablet Place 1 tablet (0.4 mg total) under the tongue every 5 (five) minutes x 3 doses as needed for chest pain.  Marland Kitchen NOVOLOG FLEXPEN 100 UNIT/ML FlexPen Inject into the skin.  . pantoprazole (PROTONIX) 40 MG tablet Take 1 tablet (40 mg total) by mouth 2 (two) times daily.  . RESTASIS 0.05 % ophthalmic emulsion Place 1 drop into both eyes 2 (two) times daily.  . rosuvastatin (CRESTOR) 10 MG tablet  Take 1 tablet (10 mg total) by mouth daily.  . TRUE METRIX BLOOD GLUCOSE TEST test strip USE AS INSTRUCTED TO CHECK BLOOD SUGAR DAILY.  . metFORMIN (GLUCOPHAGE) 1000 MG tablet Take 1 tablet (1,000 mg total) by mouth 2 (two) times daily with a meal. (Patient not taking: Reported on 09/27/2020)   No current facility-administered medications on file prior to visit.     Allergies:   Sulfa antibiotics and Other   Social History   Tobacco Use  . Smoking status: Never Smoker  . Smokeless tobacco: Never Used  Vaping Use  . Vaping Use: Never used  Substance Use Topics  . Alcohol use: No  . Drug use: No    Family History: family history includes CAD in an other family member; Cancer in her father and mother; Hypertension in her brother, mother, and sister.  ROS:   Please see the history of present illness.  Additional pertinent ROS otherwise unremarkable.  EKGs/Labs/Other Studies Reviewed:  The following studies were reviewed today: Echo 07/09/20 1. Left ventricular ejection fraction, by estimation, is 45 to 50%. The  left ventricle has mildly decreased function. The left ventricle  demonstrates global hypokinesis. Left ventricular diastolic function could  not be evaluated.  2. Right ventricular systolic function was not well visualized. The right  ventricular size is moderately enlarged. There is normal pulmonary artery  systolic pressure.  3. Left atrial size was mildly dilated.  4. Right atrial size was moderately dilated.  5. Eccentric MR jet; appears mild-moderate on parasternal views, but on  apical views (see images 52, 66, 72) extends into distal atrium and may  have Coanda effect. Incompletely visualized and PV not sampled, suspect  moderate to severe MR.. The mitral  valve is rheumatic. Moderate to severe mitral valve regurgitation.  6. Tricuspid valve regurgitation is severe.  7. The aortic valve is tricuspid. Aortic valve regurgitation is not  visualized. No  aortic stenosis is present.  8. The inferior vena cava is normal in size with <50% respiratory  variability, suggesting right atrial pressure of 8 mmHg.   Echo 07/02/2018 - Left ventricle: The cavity size was normal. Systolic function was  mildly reduced. The estimated ejection fraction was in the range  of 45% to 50%. There is moderate hypokinesis of the  entireanteroseptal myocardium.  - Aortic valve: There was mild regurgitation.  - Mitral valve: Mildly dilated, calcified annulus. There was severe  regurgitation.  - Left atrium: The atrium was severely dilated.  - Right ventricle: The cavity size was mildly dilated. Wall  thickness was normal. Systolic function was mildly to moderately  reduced.  - Right atrium: The atrium was severely dilated.  - Tricuspid valve: There was severe regurgitation.  - Pulmonary arteries: Systolic pressure was mildly increased. PA  peak pressure: 31 mm Hg (S).   Cath 12/31/2017  Prox LAD lesion is 10% stenosed.  Dist LM to Ost LAD lesion is 10% stenosed.  Ost Cx lesion is 15% stenosed.  The left ventricular systolic function is normal.  LV end diastolic pressure is normal.   EKG:  EKG is personally reviewed.  The ekg ordered 09/08/20 demonstrates atrial fibrillation, rate 108 bpm  Recent Labs: 12/23/2019: TSH 2.820 03/02/2020: B Natriuretic Peptide 43.7 04/21/2020: ALT 17 05/03/2020: Platelets 236 05/06/2020: Hemoglobin 15.3 09/08/2020: BUN 14; Creatinine, Ser 1.84; Magnesium 1.2; Potassium 3.7; Sodium 137  Recent Lipid Panel    Component Value Date/Time   CHOL 140 06/24/2020 1054   TRIG 102 06/24/2020 1054   HDL 46 06/24/2020 1054   CHOLHDL 3.0 06/24/2020 1054   CHOLHDL 3.1 12/30/2017 0436   VLDL 20 12/30/2017 0436   LDLCALC 75 06/24/2020 1054    Physical Exam:    VS:  BP (!) 152/80   Pulse 97   Ht 5' 7" (1.702 m)   Wt 272 lb 12.8 oz (123.7 kg)   BMI 42.73 kg/m     Wt Readings from Last 3 Encounters:  09/27/20 272  lb 12.8 oz (123.7 kg)  09/08/20 276 lb (125.2 kg)  07/27/20 270 lb (122.5 kg)    Speaking comfortably on the phone, no audible wheezing In no acute distress Alert and oriented Normal affect Normal speech  ASSESSMENT:    1. Longstanding persistent atrial fibrillation (HCC)   2. Secondary hypercoagulable state (HCC)   3. NICM (nonischemic cardiomyopathy) (HCC)   4. Chronic combined systolic and diastolic heart failure (HCC)   5. DOE (dyspnea on exertion)   6. Severe tricuspid   regurgitation   7. Severe mitral regurgitation    PLAN:    Persistent atrial fibrillation: -she would like admission for tikosyn load, but elective admissions are currently limited due to the Covid pandemic -need to make sure renal function doesn't worsen to the point that she can no longer receive tikosyn -CHA2DS2/VAS Stroke Risk Points=4, on dabigatran, no missed doses  Nonischemic cardiomyopathy, with acute on chronic combined systolic and diastolic heart failure: last EF 45-50% -see prior med management -on digoxin, dulaglutide, furosemide, metoprolol succinate -stopped prior metolazone, still with good urine output and stable weights, slightly improved Cr -No room to add ACEI/ARB/ARNI/MRA currently due to renal function, last Cr 1.84  History of obstructive sleep apnea, with CPAP at home: -recent sleep study was not diagnostic for sleep apanes  Severe MR, severe TR: -seen on prior echo -MR likely exacerbating atrial stretch/afib -we discussed timing of events. She would like to try to get into sinus rhythm and then do TEE to further evaluate MR/TR -if we cannot get her into sinus, could do TEE and then potential have MAZE/clip if mitral surgery needed  Type II diabetes: -A1c 9.9 09/24/20 -did not tolerate metformin -on insulin -also on dulaglutide (GLP1RA) -her GFR is <30, so SGLT2i is not recommended -on rosuvastatin 10 mg daily for diabetic dyslipidemia -per [facility], lipids 06/2020 show Tchol 140,  HDL 46, LDL 75, TG 102 -would not add aspirin as she is on dabigatran  Strong family history of heart disease/heart failure: she is unclear of the etiology of this  Cardiac risk counseling and prevention recommendations: -recommend heart healthy/Mediterranean diet, with whole grains, fruits, vegetable, fish, lean meats, nuts, and olive oil. Limit salt. -recommend moderate walking, 3-5 times/week for 30-50 minutes each session. Aim for at least 150 minutes.week. Goal should be pace of 3 miles/hours, or walking 1.5 miles in 30 minutes -recommend avoidance of tobacco products. Avoid excess alcohol. -Additional risk factor control:  -Weight: BMI 42. Would benefit from weight loss given morbid obesity with comorbidities -ASCVD risk score: The 10-year ASCVD risk score (Goff DC Jr., et al., 2013) is: 22.4%   Values used to calculate the score:     Age: 64 years     Sex: Female     Is Non-Hispanic African American: Yes     Diabetic: Yes     Tobacco smoker: No     Systolic Blood Pressure: 152 mmHg     Is BP treated: Yes     HDL Cholesterol: 46 mg/dL     Total Cholesterol: 140 mg/dL    Plan for follow up: 2 mos or sooner as needed  Today, I have spent 11 minutes with the patient with telehealth technology discussing the above problems.  Additional time spent in chart review, documentation, and communication.   , MD, PhD [place]  [facility]    Medication Adjustments/Labs and Tests Ordered: Current medicines are reviewed at length with the patient today.  Concerns regarding medicines are outlined above.  No orders of the defined types were placed in this encounter.  No orders of the defined types were placed in this encounter.   Patient Instructions  Medication Instructions:  Your Physician recommend you continue on your current medication as directed.    *If you need a refill on your cardiac medications before your next appointment, please call your  pharmacy*   Lab Work: None   Testing/Procedures: None   Follow-Up: At [facility], you and your health needs are our priority.  As   part of our continuing mission to provide you with exceptional heart care, we have created designated Provider Care Teams.  These Care Teams include your primary Cardiologist (physician) and Advanced Practice Providers (APPs -  Physician Assistants and Nurse Practitioners) who all work together to provide you with the care you need, when you need it.  We recommend signing up for the patient portal called "MyChart".  Sign up information is provided on this After Visit Summary.  MyChart is used to connect with patients for Virtual Visits (Telemedicine).  Patients are able to view lab/test results, encounter notes, upcoming appointments, etc.  Non-urgent messages can be sent to your provider as well.   To learn more about what you can do with MyChart, go to [email or web address].    Your next appointment:   2 month(s)  The format for your next appointment:   In Person  Provider:   Buford Dresser, MD      Signed, Buford Dresser, MD PhD 09/27/2020    [place]

## 2020-09-27 ENCOUNTER — Ambulatory Visit: Payer: Self-pay

## 2020-09-27 ENCOUNTER — Telehealth (INDEPENDENT_AMBULATORY_CARE_PROVIDER_SITE_OTHER): Payer: Medicare Other | Admitting: Cardiology

## 2020-09-27 ENCOUNTER — Encounter: Payer: Self-pay | Admitting: Cardiology

## 2020-09-27 ENCOUNTER — Other Ambulatory Visit (INDEPENDENT_AMBULATORY_CARE_PROVIDER_SITE_OTHER): Payer: Self-pay | Admitting: Primary Care

## 2020-09-27 ENCOUNTER — Telehealth (INDEPENDENT_AMBULATORY_CARE_PROVIDER_SITE_OTHER): Payer: Self-pay | Admitting: Primary Care

## 2020-09-27 VITALS — BP 152/80 | HR 97 | Ht 67.0 in | Wt 272.8 lb

## 2020-09-27 DIAGNOSIS — I4811 Longstanding persistent atrial fibrillation: Secondary | ICD-10-CM

## 2020-09-27 DIAGNOSIS — I5042 Chronic combined systolic (congestive) and diastolic (congestive) heart failure: Secondary | ICD-10-CM | POA: Diagnosis not present

## 2020-09-27 DIAGNOSIS — R0600 Dyspnea, unspecified: Secondary | ICD-10-CM

## 2020-09-27 DIAGNOSIS — I340 Nonrheumatic mitral (valve) insufficiency: Secondary | ICD-10-CM

## 2020-09-27 DIAGNOSIS — D6869 Other thrombophilia: Secondary | ICD-10-CM | POA: Diagnosis not present

## 2020-09-27 DIAGNOSIS — N183 Chronic kidney disease, stage 3 unspecified: Secondary | ICD-10-CM

## 2020-09-27 DIAGNOSIS — E1122 Type 2 diabetes mellitus with diabetic chronic kidney disease: Secondary | ICD-10-CM

## 2020-09-27 DIAGNOSIS — I071 Rheumatic tricuspid insufficiency: Secondary | ICD-10-CM

## 2020-09-27 DIAGNOSIS — R0609 Other forms of dyspnea: Secondary | ICD-10-CM

## 2020-09-27 DIAGNOSIS — I428 Other cardiomyopathies: Secondary | ICD-10-CM

## 2020-09-27 DIAGNOSIS — Z794 Long term (current) use of insulin: Secondary | ICD-10-CM

## 2020-09-27 NOTE — Telephone Encounter (Signed)
Pt called reporting that she had diarrhea all weekend and is experiencing swelling in her legs, feet, ankles. Transferred to triage  (since she started metformin)

## 2020-09-27 NOTE — Patient Instructions (Signed)
Medication Instructions:  °Your Physician recommend you continue on your current medication as directed.   ° °*If you need a refill on your cardiac medications before your next appointment, please call your pharmacy* ° ° °Lab Work: °None ° ° °Testing/Procedures: °None ° ° °Follow-Up: °At [facility], you and your health needs are our priority.  As part of our continuing mission to provide you with exceptional heart care, we have created designated Provider Care Teams.  These Care Teams include your primary Cardiologist (physician) and Advanced Practice Providers (APPs -  Physician Assistants and Nurse Practitioners) who all work together to provide you with the care you need, when you need it. ° °We recommend signing up for the patient portal called "MyChart".  Sign up information is provided on this After Visit Summary.  MyChart is used to connect with patients for Virtual Visits (Telemedicine).  Patients are able to view lab/test results, encounter notes, upcoming appointments, etc.  Non-urgent messages can be sent to your provider as well.   °To learn more about what you can do with MyChart, go to [email or web address].   ° °Your next appointment:   °2 month(s) ° °The format for your next appointment:   °In Person ° °Provider:   °Bridgette Christopher, MD ° ° ° ° °

## 2020-09-27 NOTE — Telephone Encounter (Signed)
Pt. Reports she started Metformin Friday, and Saturday had diarrhea and swelling to feet ankles and legs. States she did not take anymore of the medication and symptoms are resolving. Would like further advise from PCP. Answer Assessment - Initial Assessment Questions 1. NAME of MEDICATION: "What medicine are you calling about?"     Metformin 2. QUESTION: "What is your question?" (e.g., medication refill, side effect)     Causing diarrhea and swelling 3. PRESCRIBING HCP: "Who prescribed it?" Reason: if prescribed by specialist, call should be referred to that group.     Ms. Oletta Lamas 4. SYMPTOMS: "Do you have any symptoms?"     Swelling to feet, ankles and legs 5. SEVERITY: If symptoms are present, ask "Are they mild, moderate or severe?"     Moderate 6. PREGNANCY:  "Is there any chance that you are pregnant?" "When was your last menstrual period?"     No  Protocols used: MEDICATION QUESTION CALL-A-AH

## 2020-09-27 NOTE — Telephone Encounter (Signed)
Routed to PCP 

## 2020-10-05 ENCOUNTER — Ambulatory Visit (INDEPENDENT_AMBULATORY_CARE_PROVIDER_SITE_OTHER): Payer: Medicare Other | Admitting: Licensed Clinical Social Worker

## 2020-10-05 ENCOUNTER — Other Ambulatory Visit: Payer: Self-pay

## 2020-10-05 DIAGNOSIS — F4323 Adjustment disorder with mixed anxiety and depressed mood: Secondary | ICD-10-CM | POA: Diagnosis not present

## 2020-10-05 NOTE — BH Specialist Note (Signed)
Integrated Behavioral Health Initial In-Person Visit  MRN: [id number] Name: [name]  Number of [place] Clinician visits:: 1/6 Session Start time: 11:12 AM  Session End time: 11:43 AM Total time: 31 minutes  Types of Service: Individual psychotherapy  Interpretor:No. Interpretor Name and Language: NA   Subjective: [name] is a 65 y.o. female Patient was referred by NP Oletta Lamas for grief. Patient reports the following symptoms/concerns: Pt reports difficulty managing symptoms (difficulty sleeping and racing thoughts) of depression and anxiety triggered by declining health of loved one. Pt reports feelings of sadness regarding the passing of a couple of maternal aunts within the year Duration of problem: Ongoing; Severity of problem: moderate  Objective: Mood: Anxious and Affect: Appropriate Risk of harm to self or others: No plan to harm self or others  Life Context: Family and Social: Pt receives strong support from family School/Work: No financial concerns reported Self-Care: Pt denies substance use. She reports difficulty sleeping Life Changes: Pt reports declining health of loved one triggering reported symptoms  Patient and/or Family's Strengths/Protective Factors: Social connections, Social and Emotional competence, Concrete supports in place (healthy food, safe environments, etc.) and Sense of purpose  Goals Addressed: Patient will: 1. Increase knowledge and/or ability of: coping skills Pt agreed to continue utilizing healthy coping skills (praying and watching television)   Progress towards Goals: Ongoing  Interventions: Interventions utilized: Solution-Focused Strategies, Supportive Counseling, Psychoeducation and/or Health Education and Link to [employer]  Standardized Assessments completed: GAD-7 and PHQ 2&9  Patient Response: Pt was engaged during session and was successful in identifying strategies to assist  with management of symptoms  Patient Centered Plan: Patient is on the following Treatment Plan(s):  Anxiety and Depression  Assessment: Patient currently experiencing symptoms of depression and anxiety triggered by the declining health of a loved one.   Patient may benefit from therapy and continued medication management.  Plan: 1. Follow up with behavioral health clinician on : 10/26/20 2. Behavioral recommendations: Utilize strategies discussed and continue with compliance of medications 3. Referral(s): [place] (In Clinic) 4. "From scale of 1-10, how likely are you to follow plan?":   Rebekah [place], LCSW 10/07/20 3:31 PM

## 2020-10-06 ENCOUNTER — Encounter: Payer: Self-pay | Admitting: Cardiology

## 2020-10-06 NOTE — Telephone Encounter (Signed)
Called patient to stop metformin from diarrhea. She inform of elevated of headaches for a week bp readings [id number] systolic and diastolic  88-52 . Pain located frontal and occipital area- stress- may take tylenol or ibuprofen helps some. if worsening HA, changes vision/speech, imbalance, weakness go to the ER  Patient verbalized understanding.

## 2020-10-12 ENCOUNTER — Telehealth: Payer: Self-pay | Admitting: Cardiology

## 2020-10-12 ENCOUNTER — Other Ambulatory Visit (HOSPITAL_COMMUNITY): Payer: Self-pay | Admitting: *Deleted

## 2020-10-12 MED ORDER — DABIGATRAN ETEXILATE MESYLATE 150 MG PO CAPS
150.0000 mg | ORAL_CAPSULE | Freq: Two times a day (BID) | ORAL | 1 refills | Status: DC
Start: 2020-10-12 — End: 2020-11-09

## 2020-10-12 NOTE — Telephone Encounter (Signed)
25f, 125.2kg, scr 1.84 09/08/20, ccr 61, lovw/christopher 09/27/20. Pt requesting pradaxa refill

## 2020-10-12 NOTE — Telephone Encounter (Signed)
*  STAT* If patient is at the pharmacy, call can be transferred to refill team.   1. Which medications need to be refilled? (please list name of each medication and dose if known) dabigatran (PRADAXA) 150 MG CAPS capsule  2. Which pharmacy/location (including street and city if local pharmacy) is medication to be sent to? [id number] - [place], [place] - [address]  3. Do they need a 30 day or 90 day supply? [place]

## 2020-10-20 ENCOUNTER — Other Ambulatory Visit (INDEPENDENT_AMBULATORY_CARE_PROVIDER_SITE_OTHER): Payer: Self-pay | Admitting: Primary Care

## 2020-10-20 DIAGNOSIS — E1169 Type 2 diabetes mellitus with other specified complication: Secondary | ICD-10-CM

## 2020-10-26 ENCOUNTER — Ambulatory Visit (INDEPENDENT_AMBULATORY_CARE_PROVIDER_SITE_OTHER): Payer: Medicare Other | Admitting: Licensed Clinical Social Worker

## 2020-10-28 ENCOUNTER — Telehealth (INDEPENDENT_AMBULATORY_CARE_PROVIDER_SITE_OTHER): Payer: Self-pay | Admitting: Primary Care

## 2020-10-28 NOTE — Telephone Encounter (Signed)
Called to ask the doctor to send the orders for patient's inserts for her diabetic shoes as well.  Stated they only got order for the shoes.  If there are any questions, please call [phone]

## 2020-10-30 ENCOUNTER — Other Ambulatory Visit (HOSPITAL_COMMUNITY)
Admission: RE | Admit: 2020-10-30 | Discharge: 2020-10-30 | Disposition: A | Discharge status: Home / Self Care | Payer: Medicare Other | Source: Ambulatory Visit | Attending: Nurse Practitioner | Admitting: Nurse Practitioner

## 2020-10-30 DIAGNOSIS — Z01812 Encounter for preprocedural laboratory examination: Secondary | ICD-10-CM | POA: Insufficient documentation

## 2020-10-30 DIAGNOSIS — Z20822 Contact with and (suspected) exposure to covid-19: Secondary | ICD-10-CM | POA: Insufficient documentation

## 2020-10-30 LAB — SARS CORONAVIRUS 2 (TAT 6-24 HRS): SARS Coronavirus 2: NEGATIVE

## 2020-11-02 ENCOUNTER — Encounter (HOSPITAL_COMMUNITY): Payer: Self-pay | Admitting: Nurse Practitioner

## 2020-11-02 ENCOUNTER — Encounter (HOSPITAL_COMMUNITY): Payer: Self-pay | Admitting: Internal Medicine

## 2020-11-02 ENCOUNTER — Other Ambulatory Visit: Payer: Self-pay

## 2020-11-02 ENCOUNTER — Inpatient Hospital Stay (HOSPITAL_COMMUNITY)
Admission: RE | Admit: 2020-11-02 | Discharge: 2020-11-05 | DRG: 309 | Disposition: A | Discharge status: Home / Self Care | Payer: Medicare Other | Attending: Internal Medicine | Admitting: Internal Medicine

## 2020-11-02 ENCOUNTER — Ambulatory Visit (HOSPITAL_COMMUNITY)
Admission: RE | Admit: 2020-11-02 | Discharge: 2020-11-02 | Disposition: A | Discharge status: Home / Self Care | Payer: Medicare Other | Source: Ambulatory Visit | Attending: Nurse Practitioner | Admitting: Nurse Practitioner

## 2020-11-02 VITALS — BP 144/70 | HR 88 | Ht 67.0 in | Wt 301.6 lb

## 2020-11-02 DIAGNOSIS — I5032 Chronic diastolic (congestive) heart failure: Secondary | ICD-10-CM | POA: Diagnosis present

## 2020-11-02 DIAGNOSIS — Z8249 Family history of ischemic heart disease and other diseases of the circulatory system: Secondary | ICD-10-CM

## 2020-11-02 DIAGNOSIS — I4811 Longstanding persistent atrial fibrillation: Secondary | ICD-10-CM | POA: Diagnosis present

## 2020-11-02 DIAGNOSIS — J449 Chronic obstructive pulmonary disease, unspecified: Secondary | ICD-10-CM | POA: Diagnosis present

## 2020-11-02 DIAGNOSIS — Z90710 Acquired absence of both cervix and uterus: Secondary | ICD-10-CM

## 2020-11-02 DIAGNOSIS — F32A Depression, unspecified: Secondary | ICD-10-CM | POA: Diagnosis present

## 2020-11-02 DIAGNOSIS — I110 Hypertensive heart disease with heart failure: Secondary | ICD-10-CM | POA: Diagnosis present

## 2020-11-02 DIAGNOSIS — G4733 Obstructive sleep apnea (adult) (pediatric): Secondary | ICD-10-CM | POA: Diagnosis present

## 2020-11-02 DIAGNOSIS — Z794 Long term (current) use of insulin: Secondary | ICD-10-CM | POA: Diagnosis not present

## 2020-11-02 DIAGNOSIS — K219 Gastro-esophageal reflux disease without esophagitis: Secondary | ICD-10-CM | POA: Diagnosis present

## 2020-11-02 DIAGNOSIS — E876 Hypokalemia: Secondary | ICD-10-CM | POA: Diagnosis present

## 2020-11-02 DIAGNOSIS — Z79899 Other long term (current) drug therapy: Secondary | ICD-10-CM

## 2020-11-02 DIAGNOSIS — Z6841 Body Mass Index (BMI) 40.0 and over, adult: Secondary | ICD-10-CM | POA: Diagnosis not present

## 2020-11-02 DIAGNOSIS — Z20822 Contact with and (suspected) exposure to covid-19: Secondary | ICD-10-CM | POA: Diagnosis present

## 2020-11-02 DIAGNOSIS — Z9119 Patient's noncompliance with other medical treatment and regimen: Secondary | ICD-10-CM | POA: Diagnosis not present

## 2020-11-02 DIAGNOSIS — D6869 Other thrombophilia: Secondary | ICD-10-CM

## 2020-11-02 DIAGNOSIS — E119 Type 2 diabetes mellitus without complications: Secondary | ICD-10-CM | POA: Diagnosis present

## 2020-11-02 DIAGNOSIS — I081 Rheumatic disorders of both mitral and tricuspid valves: Secondary | ICD-10-CM | POA: Diagnosis present

## 2020-11-02 DIAGNOSIS — I472 Ventricular tachycardia: Secondary | ICD-10-CM | POA: Diagnosis not present

## 2020-11-02 DIAGNOSIS — Z882 Allergy status to sulfonamides status: Secondary | ICD-10-CM

## 2020-11-02 DIAGNOSIS — I4891 Unspecified atrial fibrillation: Secondary | ICD-10-CM | POA: Diagnosis present

## 2020-11-02 DIAGNOSIS — I4821 Permanent atrial fibrillation: Secondary | ICD-10-CM | POA: Diagnosis present

## 2020-11-02 DIAGNOSIS — I4819 Other persistent atrial fibrillation: Secondary | ICD-10-CM | POA: Diagnosis not present

## 2020-11-02 DIAGNOSIS — E785 Hyperlipidemia, unspecified: Secondary | ICD-10-CM | POA: Diagnosis present

## 2020-11-02 LAB — BASIC METABOLIC PANEL
Anion gap: 7 (ref 5–15)
BUN: 16 mg/dL (ref 8–23)
CO2: 30 mmol/L (ref 22–32)
Calcium: 9.1 mg/dL (ref 8.9–10.3)
Chloride: 105 mmol/L (ref 98–111)
Creatinine, Ser: 1.68 mg/dL — ABNORMAL HIGH (ref 0.44–1.00)
GFR, Estimated: 34 mL/min — ABNORMAL LOW (ref >60)
Glucose, Bld: 89 mg/dL (ref 70–99)
Potassium: 3.4 mmol/L — ABNORMAL LOW (ref 3.5–5.1)
Sodium: 142 mmol/L (ref 135–145)

## 2020-11-02 LAB — GLUCOSE, CAPILLARY
Glucose-Capillary: 107 mg/dL — ABNORMAL HIGH (ref 70–99)
Glucose-Capillary: 165 mg/dL — ABNORMAL HIGH (ref 70–99)

## 2020-11-02 LAB — HIV ANTIBODY (ROUTINE TESTING W REFLEX): HIV Screen 4th Generation wRfx: NONREACTIVE

## 2020-11-02 LAB — POTASSIUM: Potassium: 3.9 mmol/L (ref 3.5–5.1)

## 2020-11-02 LAB — MAGNESIUM
Magnesium: 1.6 mg/dL — ABNORMAL LOW (ref 1.7–2.4)
Magnesium: 2.3 mg/dL (ref 1.7–2.4)

## 2020-11-02 LAB — HEMOGLOBIN A1C
Hgb A1c MFr Bld: 9.3 % — ABNORMAL HIGH (ref 4.8–5.6)
Mean Plasma Glucose: 220.21 mg/dL

## 2020-11-02 MED ORDER — METOPROLOL SUCCINATE ER 100 MG PO TB24
100.0000 mg | ORAL_TABLET | Freq: Every day | ORAL | Status: DC
  Administered 2020-11-03 – 2020-11-05 (×3): 100 mg via ORAL
  Filled 2020-11-02 (×3): qty 1

## 2020-11-02 MED ORDER — INSULIN DETEMIR 100 UNIT/ML ~~LOC~~ SOLN
25.0000 [IU] | Freq: Two times a day (BID) | SUBCUTANEOUS | Status: DC
  Administered 2020-11-02 – 2020-11-05 (×6): 25 [IU] via SUBCUTANEOUS
  Filled 2020-11-02 (×9): qty 0.25

## 2020-11-02 MED ORDER — POTASSIUM CHLORIDE CRYS ER 20 MEQ PO TBCR
40.0000 meq | EXTENDED_RELEASE_TABLET | ORAL | Status: AC
  Administered 2020-11-02 (×2): 40 meq via ORAL
  Filled 2020-11-02 (×2): qty 2

## 2020-11-02 MED ORDER — INSULIN ASPART 100 UNIT/ML ~~LOC~~ SOLN: 0.0000 [IU] | Freq: Every day | SUBCUTANEOUS | Status: DC

## 2020-11-02 MED ORDER — MAGNESIUM SULFATE 4 GM/100ML IV SOLN
4.0000 g | Freq: Once | INTRAVENOUS | Status: AC
  Administered 2020-11-02: 4 g via INTRAVENOUS
  Filled 2020-11-02: qty 100

## 2020-11-02 MED ORDER — INSULIN ASPART 100 UNIT/ML ~~LOC~~ SOLN: 5.0000 [IU] | Freq: Three times a day (TID) | SUBCUTANEOUS | Status: DC

## 2020-11-02 MED ORDER — PANTOPRAZOLE SODIUM 40 MG PO TBEC
40.0000 mg | DELAYED_RELEASE_TABLET | Freq: Two times a day (BID) | ORAL | Status: DC
  Administered 2020-11-02 – 2020-11-05 (×6): 40 mg via ORAL
  Filled 2020-11-02 (×6): qty 1

## 2020-11-02 MED ORDER — POTASSIUM CHLORIDE CRYS ER 20 MEQ PO TBCR
40.0000 meq | EXTENDED_RELEASE_TABLET | Freq: Once | ORAL | Status: AC
  Administered 2020-11-02: 40 meq via ORAL
  Filled 2020-11-02: qty 2

## 2020-11-02 MED ORDER — ROSUVASTATIN CALCIUM 5 MG PO TABS
10.0000 mg | ORAL_TABLET | Freq: Every day | ORAL | Status: DC
  Administered 2020-11-03 – 2020-11-05 (×3): 10 mg via ORAL
  Filled 2020-11-02 (×3): qty 2

## 2020-11-02 MED ORDER — SODIUM CHLORIDE 0.9 % IV SOLN: 250.0000 mL | INTRAVENOUS | Status: DC | PRN

## 2020-11-02 MED ORDER — DULOXETINE HCL 60 MG PO CPEP
60.0000 mg | ORAL_CAPSULE | Freq: Every day | ORAL | Status: DC
  Administered 2020-11-03 – 2020-11-05 (×3): 60 mg via ORAL
  Filled 2020-11-02 (×3): qty 1

## 2020-11-02 MED ORDER — ALBUTEROL SULFATE HFA 108 (90 BASE) MCG/ACT IN AERS
2.0000 | INHALATION_SPRAY | RESPIRATORY_TRACT | Status: DC | PRN
  Filled 2020-11-02: qty 6.7

## 2020-11-02 MED ORDER — INSULIN ASPART 100 UNIT/ML ~~LOC~~ SOLN
0.0000 [IU] | Freq: Three times a day (TID) | SUBCUTANEOUS | Status: DC
  Administered 2020-11-03: 2 [IU] via SUBCUTANEOUS
  Administered 2020-11-03 – 2020-11-04 (×4): 1 [IU] via SUBCUTANEOUS
  Administered 2020-11-05: 3 [IU] via SUBCUTANEOUS

## 2020-11-02 MED ORDER — DOFETILIDE 500 MCG PO CAPS
500.0000 ug | ORAL_CAPSULE | Freq: Two times a day (BID) | ORAL | Status: DC
  Administered 2020-11-02 – 2020-11-04 (×4): 500 ug via ORAL
  Filled 2020-11-02 (×4): qty 1

## 2020-11-02 MED ORDER — ALUM & MAG HYDROXIDE-SIMETH 200-200-20 MG/5ML PO SUSP
15.0000 mL | Freq: Once | ORAL | Status: AC
  Administered 2020-11-02: 15 mL via ORAL
  Filled 2020-11-02: qty 30

## 2020-11-02 MED ORDER — DABIGATRAN ETEXILATE MESYLATE 150 MG PO CAPS
150.0000 mg | ORAL_CAPSULE | Freq: Two times a day (BID) | ORAL | Status: DC
  Administered 2020-11-02 – 2020-11-05 (×6): 150 mg via ORAL
  Filled 2020-11-02 (×8): qty 1

## 2020-11-02 MED ORDER — GABAPENTIN 300 MG PO CAPS
600.0000 mg | ORAL_CAPSULE | Freq: Two times a day (BID) | ORAL | Status: DC
  Administered 2020-11-02 – 2020-11-05 (×6): 600 mg via ORAL
  Filled 2020-11-02 (×6): qty 2

## 2020-11-02 MED ORDER — FUROSEMIDE 40 MG PO TABS
40.0000 mg | ORAL_TABLET | Freq: Two times a day (BID) | ORAL | Status: DC
  Administered 2020-11-02 – 2020-11-05 (×6): 40 mg via ORAL
  Filled 2020-11-02 (×6): qty 1

## 2020-11-02 MED ORDER — CYCLOSPORINE 0.05 % OP EMUL
1.0000 [drp] | Freq: Two times a day (BID) | OPHTHALMIC | Status: DC
  Administered 2020-11-02 – 2020-11-05 (×6): 1 [drp] via OPHTHALMIC
  Filled 2020-11-02 (×8): qty 1

## 2020-11-02 MED ORDER — SODIUM CHLORIDE 0.9% FLUSH
3.0000 mL | Freq: Two times a day (BID) | INTRAVENOUS | Status: DC
  Administered 2020-11-03 – 2020-11-05 (×5): 3 mL via INTRAVENOUS

## 2020-11-02 MED ORDER — NITROGLYCERIN 0.4 MG SL SUBL
0.4000 mg | SUBLINGUAL_TABLET | SUBLINGUAL | Status: DC | PRN
  Administered 2020-11-02: 0.4 mg via SUBLINGUAL
  Filled 2020-11-02: qty 1

## 2020-11-02 MED ORDER — SODIUM CHLORIDE 0.9% FLUSH: 3.0000 mL | INTRAVENOUS | Status: DC | PRN

## 2020-11-02 MED ORDER — DIGOXIN 125 MCG PO TABS
0.1250 mg | ORAL_TABLET | Freq: Every day | ORAL | Status: DC
  Administered 2020-11-03 – 2020-11-05 (×3): 0.125 mg via ORAL
  Filled 2020-11-02 (×3): qty 1

## 2020-11-02 MED ORDER — INSULIN DETEMIR 100 UNIT/ML ~~LOC~~ SOLN: 50.0000 [IU] | Freq: Two times a day (BID) | SUBCUTANEOUS | Status: DC

## 2020-11-02 MED ORDER — DULAGLUTIDE 1.5 MG/0.5ML ~~LOC~~ SOAJ: 1.5000 mg | SUBCUTANEOUS | Status: DC

## 2020-11-02 NOTE — Progress Notes (Signed)
Pharmacy: Dofetilide (Tikosyn) - Initial Consult Assessment and Electrolyte Replacement  Pharmacy consulted to assist in monitoring and replacing electrolytes in this 64 y.o. female admitted on 11/02/2020 undergoing dofetilide initiation.  Assessment:  Patient Exclusion Criteria: If any screening criteria checked as "Yes", then  patient  should NOT receive dofetilide until criteria item is corrected.  If Yes please indicate correction plan.  YES  NO Patient  Exclusion Criteria Correction Plan   []   [x]   Baseline QTc interval is greater than or equal to 440 msec. IF above YES box checked dofetilide contraindicated unless patient has ICD; then may proceed if QTc 500-550 msec or with known ventricular conduction abnormalities may proceed with QTc 550-600 msec. QTc =  426    []   [x]   Patient is known or suspected to have a digoxin level greater than 2 ng/ml: Lab Results  Component Value Date   DIGOXIN 0.8 03/06/2020       []   [x]   Creatinine clearance less than 20 ml/min (calculated using Cockcroft-Gault, actual body weight and serum creatinine): Estimated Creatinine Clearance: 49 mL/min (A) (by C-G formula based on SCr of 1.68 mg/dL (H)).     []   [x]  Patient has received drugs known to prolong the QT intervals within the last 48 hours (phenothiazines, tricyclics or tetracyclic antidepressants, erythromycin, H-1 antihistamines, cisapride, fluoroquinolones, azithromycin, ondansetron).   Updated information on QT prolonging agents is available to be searched on the following database:QT prolonging agents     []   [x]   Patient received a dose of hydrochlorothiazide (Oretic) alone or in any combination including triamterene (Dyazide, Maxzide) in the last 48 hours.    []   [x]  Patient received a medication known to increase dofetilide plasma concentrations prior to initial dofetilide dose:   Trimethoprim (Primsol, Proloprim) in the last 36 hours  Verapamil (Calan, Verelan) in the  last 36 hours or a sustained release dose in the last 72 hours  Megestrol (Megace) in the last 5 days   Cimetidine (Tagamet) in the last 6 hours  Ketoconazole (Nizoral) in the last 24 hours  Itraconazole (Sporanox) in the last 48 hours   Prochlorperazine (Compazine) in the last 36 hours     []   [x]   Patient is known to have a history of torsades de pointes; congenital or acquired long QT syndromes.    []   [x]   Patient has received a Class 1 antiarrhythmic with less than 2 half-lives since last dose. (Disopyramide, Quinidine, Procainamide, Lidocaine, Mexiletine, Flecainide, Propafenone)    []   [x]   Patient has received amiodarone therapy in the past 3 months or amiodarone level is greater than 0.3 ng/ml.    Patient has been appropriately anticoagulated with dabigatran.  Labs:    Component Value Date/Time   K 3.4 (L) 11/02/2020 1119   MG 1.6 (L) 11/02/2020 1119     Plan: Magnesium and potassium replacement already ordered   Thank you for allowing pharmacy to participate in this patient's care    [place] PharmD Candidate 11/02/2020  2:28 PM

## 2020-11-02 NOTE — Progress Notes (Signed)
Patient comes for Tikosyn initiation K+ 3.4 Mag 1.6 Creat 1.68 (calc CrCl using actual weight is 73)  D/w pharmacy Will order Kdur 38meq x2 and mag 4gm and recheck on lytes at 1800  I have spoken to and consulted diabetes coordinator to assist in her diabetes management while in patient, they will see her.  Tommye Standard, PA-C

## 2020-11-02 NOTE — Progress Notes (Signed)
Spoke to pharmacist about patients magnesium and potassium levels. Pharmacist stated it was okay to start Tikosyn but give an extra potassium dose. Current levels are Mag 2.3 , K 3.9. Will continue to monitor patient.

## 2020-11-02 NOTE — H&P (Signed)
Primary Care Physician: [name], NP Referring Physician: Dr. Andree Elk ( as of 2021) Previous cardiology- Dr. Carolin Guernsey Acquanetta [name] is a 65 y.o. female with a h/o hx of  many  years of longstanding persisitent afib( ekg's reviewed from all the back to 2011 show persistent  afib) , previoulsy  followed by Dr. Terrence Dupont until recently, established with  Dr. Harrell Gave, 03/31/20. She set her up for a cardioversion, she did convert to SR, but she had ERAF possibly after just a few hours. 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 She  did at one time weigh around 500-600 lbs but has been at her current weight of 300 lbs for a while. Her left atrium size is 5.3 indicating severe left atrial enlargement.She  is bothered with fatigue and shortness of breath. She is on pradaxa 150 mg bid. Dr. Harrell Gave wanted me to discuss tikosyn admit with the pt, which was done last fall but pt's admission had to be placed on hold due to covid and she is now getting back to the afib clinic to be admitted.   She states that she has not missed any pradaxa x 3 weeks. She was on metolazone but that was stopped late in the fall. Now on lasix 40 mg bid. No benadryl use. Qtc is 428 ms. She can afford the drug through her insurance.   Today, she denies symptoms of palpitations, chest pain, shortness of breath, orthopnea, PND, lower extremity edema, dizziness, presyncope, syncope, or neurologic sequela. The patient is tolerating medications without difficulties and is otherwise without complaint today.   Past Medical History:  Diagnosis Date  . Allergic rhinitis   . Arthritis   . Asthma   . Chest pain 12/27/2017  . Chronic diastolic CHF (congestive heart failure) ([place])   . COPD (chronic obstructive pulmonary disease) ([place])   . Depression   . DM (diabetes mellitus) ([place])   . DVT (deep vein thrombosis) in pregnancy   . Dysrhythmia    atrial fibrilation   . GERD (gastroesophageal reflux disease)   . Headache(784.0)   . HTN (hypertension)   . Hyperlipidemia   . Obesity   . Pneumonia 04/2018   RIGHT LOBE  . Shortness of breath   . Sleep apnea    compliant with CPAP   Past Surgical History:  Procedure Laterality Date  . ABDOMINAL HYSTERECTOMY    . CARDIOVERSION N/A 05/06/2020   Procedure: CARDIOVERSION;  Surgeon: Buford Dresser, MD;  Location: [facility] ENDOSCOPY;  Service: Cardiovascular;  Laterality: N/A;  . CATARACT EXTRACTION, BILATERAL    . CHOLECYSTECTOMY    . COLONOSCOPY WITH PROPOFOL N/A 03/05/2015   Procedure: COLONOSCOPY WITH PROPOFOL;  Surgeon: Carol Ada, MD;  Location: [facility] ENDOSCOPY;  Service: Endoscopy;  Laterality: N/A;  . DIAGNOSTIC LAPAROSCOPY    . ingrown hallux Left   . KNEE SURGERY    . LEFT HEART CATH AND CORONARY ANGIOGRAPHY N/A 12/31/2017   Procedure: LEFT HEART CATH AND CORONARY ANGIOGRAPHY;  Surgeon: Charolette Forward, MD;  Location: [facility] CV LAB;  Service: Cardiovascular;  Laterality: N/A;    Current Facility-Administered Medications  Medication Dose Route Frequency Provider Last Rate Last Admin  . 0.9 %  sodium chloride infusion  250 mL Intravenous PRN Sherran Needs, NP      . albuterol (VENTOLIN HFA) 108 (90 Base) MCG/ACT inhaler 2 puff  2 puff Inhalation Q4H PRN Sherran Needs, NP      .  cycloSPORINE (RESTASIS) 0.05 % ophthalmic emulsion 1 drop  1 drop Both Eyes BID Sherran Needs, NP      . dabigatran (PRADAXA) capsule 150 mg  150 mg Oral Q12H Sherran Needs, NP      . Derrill Memo ON 11/03/2020] digoxin (LANOXIN) tablet 0.125 mg  0.125 mg Oral Daily Sherran Needs, NP      . dofetilide (TIKOSYN) capsule 500 mcg  500 mcg Oral BID Sherran Needs, NP      . Dulaglutide SOPN 1.5 mg  1.5 mg Subcutaneous Weekly Sherran Needs, NP      . Derrill Memo ON 11/03/2020] DULoxetine (CYMBALTA) DR capsule 60 mg  60 mg Oral Daily Sherran Needs, NP      . furosemide (LASIX) tablet 40 mg  40 mg Oral BID Sherran Needs, NP      . gabapentin (NEURONTIN) capsule 600 mg  600 mg Oral BID Sherran Needs, NP      . insulin aspart (novoLOG) injection 0-5 Units  0-5 Units Subcutaneous QHS Ursuy, Renee Lynn, PA-C      . insulin aspart (novoLOG) injection 0-9 Units  0-9 Units Subcutaneous TID WC Baldwin Jamaica, PA-C      . insulin detemir (LEVEMIR) injection 25 Units  25 Units Subcutaneous BID Baldwin Jamaica, PA-C      . magnesium sulfate IVPB 4 g 100 mL  4 g Intravenous Once Baldwin Jamaica, PA-C 50 mL/hr at 11/02/20 1513 4 g at 11/02/20 1513  . [START ON 11/03/2020] metoprolol succinate (TOPROL-XL) 24 hr tablet 100 mg  100 mg Oral Daily Sherran Needs, NP      . nitroGLYCERIN (NITROSTAT) SL tablet 0.4 mg  0.4 mg Sublingual Q5 Min x 3 PRN Sherran Needs, NP      . pantoprazole (PROTONIX) EC tablet 40 mg  40 mg Oral BID Sherran Needs, NP      . potassium chloride SA (KLOR-CON) CR tablet 40 mEq  40 mEq Oral Q2H Baldwin Jamaica, PA-C   40 mEq at 11/02/20 1514  . rosuvastatin (CRESTOR) tablet 10 mg  10 mg Oral Daily Roderic Palau C, NP      . sodium chloride flush (NS) 0.9 % injection 3 mL  3 mL Intravenous Q12H Roderic Palau C, NP      . sodium chloride flush (NS) 0.9 % injection 3 mL  3 mL Intravenous PRN Sherran Needs, NP        Allergies  Allergen Reactions  . Sulfa Antibiotics Itching  . Other     Other reaction(s): Unknown    Social History   Socioeconomic History  . Marital status: Widowed    Spouse name: Not on file  . Number of children: Not on file  . Years of education: Not on file  . Highest education level: Not on file  Occupational History  . Not on file  Tobacco Use  . Smoking status: Never Smoker  . Smokeless tobacco: Never Used  Vaping Use  . Vaping Use: Never used  Substance and Sexual Activity  . Alcohol use: No  . Drug use: No  . Sexual activity: Not on file  Other Topics Concern  . Not on file  Social History Narrative   Pt lives in [place] with  spouse.  2 grown children.   Previously worked in [employer] at [employer].  Now on disability   Social Determinants of Health   Financial Resource Strain: Not  on file  Food Insecurity: Not on file  Transportation Needs: Not on file  Physical Activity: Not on file  Stress: Not on file  Social Connections: Not on file  Intimate Partner Violence: Not on file    Family History  Problem Relation Age of Onset  . Cancer Mother   . Hypertension Mother   . Cancer Father   . CAD Other   . Hypertension Sister   . Hypertension Brother     ROS- All systems are reviewed and negative except as per the HPI above  Physical Exam: Vitals:   11/02/20 1347  BP: (!) 135/91  Pulse: 86  Resp: 20  Temp: 98.2 F (36.8 C)  TempSrc: Oral  SpO2: 98%  Weight: (!) 137.2 kg  Height: 5\' 7"  (1.702 m)   Wt Readings from Last 3 Encounters:  11/02/20 (!) 137.2 kg  11/02/20 (!) 136.8 kg  09/27/20 123.7 kg    Labs: Lab Results  Component Value Date   NA 142 11/02/2020   K 3.4 (L) 11/02/2020   CL 105 11/02/2020   CO2 30 11/02/2020   GLUCOSE 89 11/02/2020   BUN 16 11/02/2020   CREATININE 1.68 (H) 11/02/2020   CALCIUM 9.1 11/02/2020   PHOS 3.2 03/08/2020   MG 1.6 (L) 11/02/2020   Lab Results  Component Value Date   INR 1.5 (H) 03/02/2020   Lab Results  Component Value Date   CHOL 140 06/24/2020   HDL 46 06/24/2020   LDLCALC 75 06/24/2020   TRIG 102 06/24/2020     GEN- The patient is well appearing, alert and oriented x 3 today.   Head- normocephalic, atraumatic Eyes-  Sclera clear, conjunctiva pink Ears- hearing intact Oropharynx- clear Neck- supple, no JVP Lymph- no cervical lymphadenopathy Lungs- Clear to ausculation bilaterally, normal work of breathing Heart- irregular rate and rhythm, no murmurs, rubs or gallops, PMI not laterally displaced GI- soft, NT, ND, + BS Extremities- no clubbing, cyanosis, or edema MS- no significant deformity or atrophy Skin- no rash or  lesion Psych- euthymic mood, full affect Neuro- strength and sensation are intact  EKG-afib at 88  bpm, qrs int 86 ms, qtc 428  ms   Echo-1. Left ventricular ejection fraction, by estimation, is 45 to 50%. The  left ventricle has mildly decreased function. The left ventricle  demonstrates global hypokinesis. Left ventricular diastolic function could  not be evaluated.  2. Right ventricular systolic function was not well visualized. The right  ventricular size is moderately enlarged. There is normal pulmonary artery  systolic pressure.  3. Left atrial size was mildly dilated. (LA diam at 5.30)  4. Right atrial size was moderately dilated.  5. Eccentric MR jet; appears mild-moderate on parasternal views, but on  apical views (see images 52, 66, 72) extends into distal atrium and may  have Coanda effect. Incompletely visualized and PV not sampled, suspect  moderate to severe MR.. The mitral  valve is rheumatic. Moderate to severe mitral valve regurgitation.  6. Tricuspid valve regurgitation is severe.  7. The aortic valve is tricuspid. Aortic valve regurgitation is not  visualized. No aortic stenosis is present.  8. The inferior vena cava is normal in size with <50% respiratory  variability, suggesting right atrial pressure of 8 mmHg.   Comparison(s): No significant change from prior study.   Conclusion(s)/Recommendation(s): Difficult images. Likely severe TR and  moderate-severe MR, incompletely visualized on this study. RV is enlarged,  function/thickness not well seen.    Assessment and Plan:  1. Longstanding  Persistent afib All ekg's reviewed  Persistent afib since at least 2011, probably longer Valvular status probably contributing  Discussed with Dr. Rayann Heman. Dr. Harrell Gave would like a try at [place] first, if unsuccessful, she may refer for valve surgery with [facility] No benadryl use Continue  metoprolol 100 mg daily Can afford med qtc is acceptable  Cr cl cal at  72.92  indicating starting at 500 mcg bid  Her K+ is 3.4 and mag at 1.6 and will need replacement before receiving first dofetilide dose tonight   2. CHA2DS2VASc score of 3 Continue  pradax 150 mg bid  She states no missed doses for at least 3 weeks    To [place] C. [name], [facility] [address] [place], [place] [address] [phone]    I have seen, examined the patient, and reviewed the above assessment and plan.  Changes to above are made where necessary.  On exam, iRRR.  We will admit for initiation of tikosyn.  She reports compliance with pradaxa without interruption.  Co Sign: Thompson Grayer, MD 11/02/2020 3:45 PM

## 2020-11-02 NOTE — Progress Notes (Signed)
Primary Care Physician: [name], NP Referring Physician: Dr. Andree Elk ( as of 2021) Previous cardiology- Dr. [name] is a 65 y.o. female with a h/o hx of  many  years of longstanding persisitent afib( ekg's reviewed from all the back to 2011 show persistent  afib) , previoulsy  followed by Dr. Terrence Dupont until recently, established with  Dr. Harrell Gave, 03/31/20. She set her up for a cardioversion, she did convert to SR, but she had ERAF possibly after just a few hours. 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 She  did at one time weigh around 500-600 lbs but has been at her current weight of 300 lbs for a while. Her left atrium size is 5.3 indicating severe left atrial enlargement.She  is bothered with fatigue and shortness of breath. She is on pradaxa 150 mg bid. Dr. Harrell Gave wanted me to discuss tikosyn admit with the pt, which was done last fall but pt's admission had to be placed on hold due to covid and she is now getting back to the afib clinic to be admitted.   She states that she has not missed any pradaxa x 3 weeks. She was on metolazone but that was stopped late in the fall. Now on lasix 40 mg bid. No benadryl use. Qtc is 428 ms. She can afford the drug through her insurance.   Today, she denies symptoms of palpitations, chest pain, shortness of breath, orthopnea, PND, lower extremity edema, dizziness, presyncope, syncope, or neurologic sequela. The patient is tolerating medications without difficulties and is otherwise without complaint today.   Past Medical History:  Diagnosis Date  . Allergic rhinitis   . Arthritis   . Asthma   . Chest pain 12/27/2017  . Chronic diastolic CHF (congestive heart failure) ([place])   . COPD (chronic obstructive pulmonary disease) ([place])   . Depression   . DM (diabetes mellitus) ([place])   . DVT (deep vein thrombosis) in pregnancy   . Dysrhythmia    atrial fibrilation   . GERD (gastroesophageal reflux disease)   . Headache(784.0)   . HTN (hypertension)   . Hyperlipidemia   . Obesity   . Pneumonia 04/2018   RIGHT LOBE  . Shortness of breath   . Sleep apnea    compliant with CPAP   Past Surgical History:  Procedure Laterality Date  . ABDOMINAL HYSTERECTOMY    . CARDIOVERSION N/A 05/06/2020   Procedure: CARDIOVERSION;  Surgeon: Buford Dresser, MD;  Location: [facility] ENDOSCOPY;  Service: Cardiovascular;  Laterality: N/A;  . CATARACT EXTRACTION, BILATERAL    . CHOLECYSTECTOMY    . COLONOSCOPY WITH PROPOFOL N/A 03/05/2015   Procedure: COLONOSCOPY WITH PROPOFOL;  Surgeon: Carol Ada, MD;  Location: [facility] ENDOSCOPY;  Service: Endoscopy;  Laterality: N/A;  . DIAGNOSTIC LAPAROSCOPY    . ingrown hallux Left   . KNEE SURGERY    . LEFT HEART CATH AND CORONARY ANGIOGRAPHY N/A 12/31/2017   Procedure: LEFT HEART CATH AND CORONARY ANGIOGRAPHY;  Surgeon: Charolette Forward, MD;  Location: [facility];  Service: Cardiovascular;  Laterality: N/A;    Current Outpatient Medications  Medication Sig Dispense Refill  . Accu-Chek Softclix Lancets lancets Use as instructed to check blood sugar three times daily. E11.69 100 each 12  . albuterol (VENTOLIN HFA) 108 (90 Base) MCG/ACT inhaler Inhale 2 puffs into the lungs every 4 (four) hours as needed for wheezing or shortness of breath.     Marland Kitchen  Alcohol Swabs (B-D SINGLE USE SWABS REGULAR) PADS     . Blood Glucose Monitoring Suppl (ACCU-CHEK GUIDE ME) w/Device KIT Use as instructed to check blood sugar three times daily. E11.69 1 kit 0  . Blood Glucose Monitoring Suppl (ACCU-CHEK GUIDE) w/Device KIT USE AS INSTRUCTED TO CHECK BLOOD SUGAR THREE TIMES DAILY. E11.69    . dabigatran (PRADAXA) 150 MG CAPS capsule Take 1 capsule (150 mg total) by mouth 2 (two) times daily. 180 capsule 1  . digoxin (LANOXIN) 0.125 MG tablet Take 0.125 mg by mouth daily.     . Dulaglutide (TRULICITY) 1.5 LP/3.7TK SOPN Inject 1.5 mg into the skin  once a week. 0.5 mL 3  . DULoxetine (CYMBALTA) 60 MG capsule Take 1 capsule (60 mg total) by mouth daily. 90 capsule 3  . EPINEPHrine 0.3 mg/0.3 mL IJ SOAJ injection Inject 0.3 mg into the muscle as needed for anaphylaxis. 1 each 1  . furosemide (LASIX) 40 MG tablet Take 1 tablet (40 mg total) by mouth 2 (two) times daily. 180 tablet 3  . gabapentin (NEURONTIN) 300 MG capsule Take 2 capsules (600 mg total) by mouth 2 (two) times daily. 360 capsule 1  . insulin aspart (NOVOLOG) 100 UNIT/ML injection Inject 12 Units into the skin 3 (three) times daily before meals. For blood sugars 0-199 give 0 units of insulin, 201-250 give 4 units, 251-300 give 6 units, 301-350 give 8 units, 351-400 give 10 units,> 400 give 12 units and call M.D. 10 mL 3  . LEVEMIR 100 UNIT/ML injection Inject into the skin.    Marland Kitchen METOPROLOL SUCCINATE ER PO 100 mg daily.    . Multiple Vitamins-Minerals (MULTIVITAMIN WOMEN PO) Take 1 tablet by mouth daily.     . nitroGLYCERIN (NITROSTAT) 0.4 MG SL tablet Place 1 tablet (0.4 mg total) under the tongue every 5 (five) minutes x 3 doses as needed for chest pain. 25 tablet 12  . NOVOLOG FLEXPEN 100 UNIT/ML FlexPen Inject into the skin.    . pantoprazole (PROTONIX) 40 MG tablet Take 1 tablet (40 mg total) by mouth 2 (two) times daily. 180 tablet 1  . RESTASIS 0.05 % ophthalmic emulsion Place 1 drop into both eyes 2 (two) times daily.  12  . rosuvastatin (CRESTOR) 10 MG tablet Take 1 tablet (10 mg total) by mouth daily. 90 tablet 1  . TRUE METRIX BLOOD GLUCOSE TEST test strip USE AS INSTRUCTED TO CHECK BLOOD SUGAR DAILY. 100 strip 11   No current facility-administered medications for this encounter.    Allergies  Allergen Reactions  . Sulfa Antibiotics Itching  . Other     Other reaction(s): Unknown    Social History   Socioeconomic History  . Marital status: Widowed    Spouse name: Not on file  . Number of children: Not on file  . Years of education: Not on file  . Highest  education level: Not on file  Occupational History  . Not on file  Tobacco Use  . Smoking status: Never Smoker  . Smokeless tobacco: Never Used  Vaping Use  . Vaping Use: Never used  Substance and Sexual Activity  . Alcohol use: No  . Drug use: No  . Sexual activity: Not on file  Other Topics Concern  . Not on file  Social History Narrative   Pt lives in [place] with spouse.  2 grown children.   Previously worked in [employer] at [employer].  Now on disability   Social Determinants of Health  Financial Resource Strain: Not on file  Food Insecurity: Not on file  Transportation Needs: Not on file  Physical Activity: Not on file  Stress: Not on file  Social Connections: Not on file  Intimate Partner Violence: Not on file    Family History  Problem Relation Age of Onset  . Cancer Mother   . Hypertension Mother   . Cancer Father   . CAD Other   . Hypertension Sister   . Hypertension Brother     ROS- All systems are reviewed and negative except as per the HPI above  Physical Exam: Vitals:   11/02/20 1127  BP: (!) 144/70  Pulse: 88  Weight: (!) 136.8 kg  Height: 5\' 7"  (1.702 m)   Wt Readings from Last 3 Encounters:  11/02/20 (!) 136.8 kg  09/27/20 123.7 kg  09/08/20 125.2 kg    Labs: Lab Results  Component Value Date   NA 142 11/02/2020   K 3.4 (L) 11/02/2020   CL 105 11/02/2020   CO2 30 11/02/2020   GLUCOSE 89 11/02/2020   BUN 16 11/02/2020   CREATININE 1.68 (H) 11/02/2020   CALCIUM 9.1 11/02/2020   PHOS 3.2 03/08/2020   MG 1.6 (L) 11/02/2020   Lab Results  Component Value Date   INR 1.5 (H) 03/02/2020   Lab Results  Component Value Date   CHOL 140 06/24/2020   HDL 46 06/24/2020   LDLCALC 75 06/24/2020   TRIG 102 06/24/2020     GEN- The patient is well appearing, alert and oriented x 3 today.   Head- normocephalic, atraumatic Eyes-  Sclera clear, conjunctiva pink Ears- hearing intact Oropharynx- clear Neck- supple, no JVP Lymph- no  cervical lymphadenopathy Lungs- Clear to ausculation bilaterally, normal work of breathing Heart- irregular rate and rhythm, no murmurs, rubs or gallops, PMI not laterally displaced GI- soft, NT, ND, + BS Extremities- no clubbing, cyanosis, or edema MS- no significant deformity or atrophy Skin- no rash or lesion Psych- euthymic mood, full affect Neuro- strength and sensation are intact  EKG-afib at 88  bpm, qrs int 86 ms, qtc 428  ms   Echo-1. Left ventricular ejection fraction, by estimation, is 45 to 50%. The  left ventricle has mildly decreased function. The left ventricle  demonstrates global hypokinesis. Left ventricular diastolic function could  not be evaluated.  2. Right ventricular systolic function was not well visualized. The right  ventricular size is moderately enlarged. There is normal pulmonary artery  systolic pressure.  3. Left atrial size was mildly dilated. (LA diam at 5.30)  4. Right atrial size was moderately dilated.  5. Eccentric MR jet; appears mild-moderate on parasternal views, but on  apical views (see images 52, 66, 72) extends into distal atrium and may  have Coanda effect. Incompletely visualized and PV not sampled, suspect  moderate to severe MR.. The mitral  valve is rheumatic. Moderate to severe mitral valve regurgitation.  6. Tricuspid valve regurgitation is severe.  7. The aortic valve is tricuspid. Aortic valve regurgitation is not  visualized. No aortic stenosis is present.  8. The inferior vena cava is normal in size with <50% respiratory  variability, suggesting right atrial pressure of 8 mmHg.   Comparison(s): No significant change from prior study.   Conclusion(s)/Recommendation(s): Difficult images. Likely severe TR and  moderate-severe MR, incompletely visualized on this study. RV is enlarged,  function/thickness not well seen.    Assessment and Plan: 1. Longstanding  Persistent afib All ekg's reviewed  Persistent afib since  at least 2011, probably longer Valvular status probably contributing  Discussed with Dr. Rayann Heman. Dr. Harrell Gave would like a try at [facility] first, if unsuccessful, she may refer for valve surgery with [facility] No benadryl use Continue  metoprolol 100 mg daily Can afford med qtc is acceptable  Cr cl cal at 72.92  indicating starting at 500 mcg bid  Her K+ is 3.4 and mag at 1.6 and will need replacement before receiving first dofetilide dose tonight   2. CHA2DS2VASc score of 3 Continue  pradax 150 mg bid  She states no missed doses for at least 3 weeks    To [place] C. Delon Revelo, [facility] [address] [place], [place] [address] [phone]

## 2020-11-02 NOTE — Care Management (Signed)
11-02-20 Patient presented for Tikosyn Load. Benefits check submitted and Case Manager will follow for cost and pharmacy of choice. Graves-Bigelow, Ocie Cornfield, RN, BSN Case Manager

## 2020-11-02 NOTE — Progress Notes (Signed)
Inpatient Diabetes Program Recommendations  AACE/ADA: New Consensus Statement on Inpatient Glycemic Control (2015)  Target Ranges:  Prepandial:   less than 140 mg/dL      Peak postprandial:   less than 180 mg/dL (1-2 hours)      Critically ill patients:  140 - 180 mg/dL   Lab Results  Component Value Date   GLUCAP 154 (H) 03/08/2020   HGBA1C 9.9 (A) 09/24/2020    Review of Glycemic Control  Diabetes history: DM2 Outpatient Diabetes medications: Levemir 50 units bid + Novolog 0-12 units tid meal coverage starting CBG> 101 + Trulicity 1.5 q week Current orders for Inpatient glycemic control: Levemir 50 units bid + Novolog 5 units tid meal coverage + Trulicity 1.5 q week  Inpatient Diabetes Program Recommendations:   -Glycemic control order set with Novolog correction 0-9 units tid + 0-5 units hs -Decrease Levemir to 25 units bid while in the hospital -A1c to determine prehospital glycemic control  Ordered Living Well With Diabetes Book  Thank you, Nani Gasser. Hanks, RN, MSN, CDE  Diabetes Coordinator Inpatient Glycemic Control Team Team Pager [phone] (8am-5pm) 11/02/2020 2:45 PM

## 2020-11-03 LAB — GLUCOSE, CAPILLARY
Glucose-Capillary: 121 mg/dL — ABNORMAL HIGH (ref 70–99)
Glucose-Capillary: 121 mg/dL — ABNORMAL HIGH (ref 70–99)
Glucose-Capillary: 173 mg/dL — ABNORMAL HIGH (ref 70–99)
Glucose-Capillary: 179 mg/dL — ABNORMAL HIGH (ref 70–99)

## 2020-11-03 LAB — BASIC METABOLIC PANEL
Anion gap: 6 (ref 5–15)
BUN: 15 mg/dL (ref 8–23)
CO2: 30 mmol/L (ref 22–32)
Calcium: 8.9 mg/dL (ref 8.9–10.3)
Chloride: 104 mmol/L (ref 98–111)
Creatinine, Ser: 1.57 mg/dL — ABNORMAL HIGH (ref 0.44–1.00)
GFR, Estimated: 37 mL/min — ABNORMAL LOW (ref >60)
Glucose, Bld: 131 mg/dL — ABNORMAL HIGH (ref 70–99)
Potassium: 4 mmol/L (ref 3.5–5.1)
Sodium: 140 mmol/L (ref 135–145)

## 2020-11-03 LAB — MAGNESIUM: Magnesium: 2.2 mg/dL (ref 1.7–2.4)

## 2020-11-03 MED ORDER — IBUPROFEN 200 MG PO TABS
400.0000 mg | ORAL_TABLET | Freq: Four times a day (QID) | ORAL | Status: AC | PRN
  Administered 2020-11-03: 400 mg via ORAL
  Filled 2020-11-03: qty 2

## 2020-11-03 MED ORDER — HYDROCORTISONE 1 % EX CREA
1.0000 "application " | TOPICAL_CREAM | Freq: Three times a day (TID) | CUTANEOUS | Status: DC | PRN
  Filled 2020-11-03: qty 28

## 2020-11-03 MED ORDER — ACETAMINOPHEN 325 MG PO TABS
650.0000 mg | ORAL_TABLET | Freq: Four times a day (QID) | ORAL | Status: DC | PRN
  Administered 2020-11-03 – 2020-11-05 (×5): 650 mg via ORAL
  Filled 2020-11-03 (×5): qty 2

## 2020-11-03 MED ORDER — POTASSIUM CHLORIDE CRYS ER 10 MEQ PO TBCR
30.0000 meq | EXTENDED_RELEASE_TABLET | Freq: Once | ORAL | Status: AC
  Administered 2020-11-03: 30 meq via ORAL
  Filled 2020-11-03: qty 3

## 2020-11-03 MED ORDER — ZOLPIDEM TARTRATE 5 MG PO TABS
5.0000 mg | ORAL_TABLET | Freq: Every evening | ORAL | Status: AC | PRN
  Administered 2020-11-03: 5 mg via ORAL
  Filled 2020-11-03: qty 1

## 2020-11-03 NOTE — H&P (View-Only) (Signed)
Progress Note  Patient Name: [name] Date of Encounter: 11/03/2020  [facility] Cardiologist: Buford Dresser, MD   Subjective   Chest discomfort, no SOB at rest, known DOE at her baseline, tolerating drug  Inpatient Medications    Scheduled Meds: . cycloSPORINE  1 drop Both Eyes BID  . dabigatran  150 mg Oral Q12H  . digoxin  0.125 mg Oral Daily  . dofetilide  500 mcg Oral BID  . DULoxetine  60 mg Oral Daily  . furosemide  40 mg Oral BID  . gabapentin  600 mg Oral BID  . insulin aspart  0-5 Units Subcutaneous QHS  . insulin aspart  0-9 Units Subcutaneous TID WC  . insulin detemir  25 Units Subcutaneous BID  . metoprolol succinate  100 mg Oral Daily  . pantoprazole  40 mg Oral BID  . rosuvastatin  10 mg Oral Daily  . sodium chloride flush  3 mL Intravenous Q12H   Continuous Infusions: . sodium chloride     PRN Meds: sodium chloride, albuterol, nitroGLYCERIN, sodium chloride flush   Vital Signs    Vitals:   11/02/20 2020 11/03/20 0030 11/03/20 0414 11/03/20 0736  BP: 126/88 127/77 (!) 147/87 (!) 156/121  Pulse: 77 85 75 96  Resp:  19 19 20   Temp: 98.3 F (36.8 C) 98 F (36.7 C) 98 F (36.7 C) 97.6 F (36.4 C)  TempSrc: Axillary Oral Oral Oral  SpO2:  100%  97%  Weight:   (!) 136.9 kg   Height:        Intake/Output Summary (Last 24 hours) at 11/03/2020 0840 Last data filed at 11/03/2020 0800 Gross per 24 hour  Intake 780.83 ml  Output --  Net 780.83 ml   Last 3 Weights 11/03/2020 11/02/2020 11/02/2020  Weight (lbs) 301 lb 13 oz 302 lb 8 oz 301 lb 9.6 oz  Weight (kg) 136.9 kg 137.213 kg 136.805 kg  Some encounter information is confidential and restricted. Go to Review Flowsheets activity to see all data.      Telemetry    AFib 70's-80's, occ PVCs (noted prior to drug as well) - Personally Reviewed  ECG    Afib 70bpm, QT is appears stable - Personally Reviewed  Physical Exam   GEN: No acute distress.   Neck: No  JVD Cardiac: irreg-irreg, 2/6 SM w/diastolic component, no murmurs, rubs, or gallops.  Respiratory: CTA b/l. GI: Soft, nontender, non-distended  MS: No edema; No deformity. Neuro:  Nonfocal  Psych: Normal affect   Labs    High Sensitivity Troponin:  No results for input(s): TROPONINIHS in the last 720 hours.    Chemistry Recent Labs  Lab 11/02/20 1119 11/02/20 1824 11/03/20 0311  NA 142  --  140  K 3.4* 3.9 4.0  CL 105  --  104  CO2 30  --  30  GLUCOSE 89  --  131*  BUN 16  --  15  CREATININE 1.68*  --  1.57*  CALCIUM 9.1  --  8.9  GFRNONAA 34*  --  37*  ANIONGAP 7  --  6     HematologyNo results for input(s): WBC, RBC, HGB, HCT, MCV, MCH, MCHC, RDW, PLT in the last 168 hours.  BNPNo results for input(s): BNP, PROBNP in the last 168 hours.   DDimer No results for input(s): DDIMER in the last 168 hours.   Radiology    No results found.  Cardiac Studies    07/08/20: TTE IMPRESSIONS  1. Left  ventricular ejection fraction, by estimation, is 45 to 50%. The  left ventricle has mildly decreased function. The left ventricle  demonstrates global hypokinesis. Left ventricular diastolic function could  not be evaluated.  2. Right ventricular systolic function was not well visualized. The right  ventricular size is moderately enlarged. There is normal pulmonary artery  systolic pressure.  3. Left atrial size was mildly dilated.  4. Right atrial size was moderately dilated.  5. Eccentric MR jet; appears mild-moderate on parasternal views, but on  apical views (see images 52, 66, 72) extends into distal atrium and may  have Coanda effect. Incompletely visualized and PV not sampled, suspect  moderate to severe MR.. The mitral  valve is rheumatic. Moderate to severe mitral valve regurgitation.  6. Tricuspid valve regurgitation is severe.  7. The aortic valve is tricuspid. Aortic valve regurgitation is not  visualized. No aortic stenosis is present.  8. The  inferior vena cava is normal in size with <50% respiratory  variability, suggesting right atrial pressure of 8 mmHg.   Comparison(s): No significant change from prior study.    12/31/2017: LHC  Prox LAD lesion is 10% stenosed.  Dist LM to Ost LAD lesion is 10% stenosed.  Ost Cx lesion is 15% stenosed.  The left ventricular systolic function is normal.  LV end diastolic pressure is normal.    Patient Profile     65 y.o. 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  Assessment & Plan    1. Longstanding persistent Afib     CHA2DS2Vasc is 4, on Pradaxa, appropriately dosed     Tikosyn load is in progress     K+ 4.0     Mag 2.2     Creat 1.57, stable, Tikosyn appropriately dosed     QT stable  She required significant electrolyte replacement to get started, likely will need some supplementation going home DCCV tomorrow if not in SR, discussed with patient procedure, potential risks/benefots, she is agreeable.   2. DM     Appreciate DM coordinator assist  3. OSA     She mentions that she tends to not use every night, discussed importance of this     Will order for here  4. CP     Very much reproducible with palpation of her chest wall,  c/w musculoskeletal discomfort     Will order tylenol  5. VHD     Management with attending cardiologist out pt     MV described as rheumatic, no known hx of rheumatic fever as a child, 2 siblings with VHD and CHF   For questions or updates, please contact [facility] Please consult [email or web address] for contact info under        Signed, Baldwin Jamaica, PA-C  11/03/2020, 8:40 AM     I have seen, examined the patient, and reviewed the above assessment and plan.  Changes to above are made where necessary.  On exam, iRRR.  Started on tikosyn.  We will follow qt. Given extensive MR, I am not  convinced that we will be successful in managing her arrhythmia medically.  Will see how she does with tikosyn load.  Co Sign: Thompson Grayer, MD 11/03/2020 1:44 PM

## 2020-11-03 NOTE — Care Management (Signed)
11-03-20 [place] spoke with patient and she has a zero co pay. Patient is agreeable to first fill via [place] to be delivered to the bedside. Refills are to be escribed to [place]. Bethena Roys, RN,BSN Case Manager

## 2020-11-03 NOTE — Progress Notes (Addendum)
Progress Note  Patient Name: [name] Date of Encounter: 11/03/2020  [facility] Cardiologist: Buford Dresser, MD   Subjective   Chest discomfort, no SOB at rest, known DOE at her baseline, tolerating drug  Inpatient Medications    Scheduled Meds: . cycloSPORINE  1 drop Both Eyes BID  . dabigatran  150 mg Oral Q12H  . digoxin  0.125 mg Oral Daily  . dofetilide  500 mcg Oral BID  . DULoxetine  60 mg Oral Daily  . furosemide  40 mg Oral BID  . gabapentin  600 mg Oral BID  . insulin aspart  0-5 Units Subcutaneous QHS  . insulin aspart  0-9 Units Subcutaneous TID WC  . insulin detemir  25 Units Subcutaneous BID  . metoprolol succinate  100 mg Oral Daily  . pantoprazole  40 mg Oral BID  . rosuvastatin  10 mg Oral Daily  . sodium chloride flush  3 mL Intravenous Q12H   Continuous Infusions: . sodium chloride     PRN Meds: sodium chloride, albuterol, nitroGLYCERIN, sodium chloride flush   Vital Signs    Vitals:   11/02/20 2020 11/03/20 0030 11/03/20 0414 11/03/20 0736  BP: 126/88 127/77 (!) 147/87 (!) 156/121  Pulse: 77 85 75 96  Resp:  19 19 20   Temp: 98.3 F (36.8 C) 98 F (36.7 C) 98 F (36.7 C) 97.6 F (36.4 C)  TempSrc: Axillary Oral Oral Oral  SpO2:  100%  97%  Weight:   (!) 136.9 kg   Height:        Intake/Output Summary (Last 24 hours) at 11/03/2020 0840 Last data filed at 11/03/2020 0800 Gross per 24 hour  Intake 780.83 ml  Output --  Net 780.83 ml   Last 3 Weights 11/03/2020 11/02/2020 11/02/2020  Weight (lbs) 301 lb 13 oz 302 lb 8 oz 301 lb 9.6 oz  Weight (kg) 136.9 kg 137.213 kg 136.805 kg  Some encounter information is confidential and restricted. Go to Review Flowsheets activity to see all data.      Telemetry    AFib 70's-80's, occ PVCs (noted prior to drug as well) - Personally Reviewed  ECG    Afib 70bpm, QT is appears stable - Personally Reviewed  Physical Exam   GEN: No acute distress.   Neck: No  JVD Cardiac: irreg-irreg, 2/6 SM w/diastolic component, no murmurs, rubs, or gallops.  Respiratory: CTA b/l. GI: Soft, nontender, non-distended  MS: No edema; No deformity. Neuro:  Nonfocal  Psych: Normal affect   Labs    High Sensitivity Troponin:  No results for input(s): TROPONINIHS in the last 720 hours.    Chemistry Recent Labs  Lab 11/02/20 1119 11/02/20 1824 11/03/20 0311  NA 142  --  140  K 3.4* 3.9 4.0  CL 105  --  104  CO2 30  --  30  GLUCOSE 89  --  131*  BUN 16  --  15  CREATININE 1.68*  --  1.57*  CALCIUM 9.1  --  8.9  GFRNONAA 34*  --  37*  ANIONGAP 7  --  6     HematologyNo results for input(s): WBC, RBC, HGB, HCT, MCV, MCH, MCHC, RDW, PLT in the last 168 hours.  BNPNo results for input(s): BNP, PROBNP in the last 168 hours.   DDimer No results for input(s): DDIMER in the last 168 hours.   Radiology    No results found.  Cardiac Studies    07/08/20: TTE IMPRESSIONS  1. Left  ventricular ejection fraction, by estimation, is 45 to 50%. The  left ventricle has mildly decreased function. The left ventricle  demonstrates global hypokinesis. Left ventricular diastolic function could  not be evaluated.  2. Right ventricular systolic function was not well visualized. The right  ventricular size is moderately enlarged. There is normal pulmonary artery  systolic pressure.  3. Left atrial size was mildly dilated.  4. Right atrial size was moderately dilated.  5. Eccentric MR jet; appears mild-moderate on parasternal views, but on  apical views (see images 52, 66, 72) extends into distal atrium and may  have Coanda effect. Incompletely visualized and PV not sampled, suspect  moderate to severe MR.. The mitral  valve is rheumatic. Moderate to severe mitral valve regurgitation.  6. Tricuspid valve regurgitation is severe.  7. The aortic valve is tricuspid. Aortic valve regurgitation is not  visualized. No aortic stenosis is present.  8. The  inferior vena cava is normal in size with <50% respiratory  variability, suggesting right atrial pressure of 8 mmHg.   Comparison(s): No significant change from prior study.    12/31/2017: LHC  Prox LAD lesion is 10% stenosed.  Dist LM to Ost LAD lesion is 10% stenosed.  Ost Cx lesion is 15% stenosed.  The left ventricular systolic function is normal.  LV end diastolic pressure is normal.    Patient Profile     65 y.o. 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  Assessment & Plan    1. Longstanding persistent Afib     CHA2DS2Vasc is 4, on Pradaxa, appropriately dosed     Tikosyn load is in progress     K+ 4.0     Mag 2.2     Creat 1.57, stable, Tikosyn appropriately dosed     QT stable  She required significant electrolyte replacement to get started, likely will need some supplementation going home DCCV tomorrow if not in SR, discussed with patient procedure, potential risks/benefots, she is agreeable.   2. DM     Appreciate DM coordinator assist  3. OSA     She mentions that she tends to not use every night, discussed importance of this     Will order for here  4. CP     Very much reproducible with palpation of her chest wall,  c/w musculoskeletal discomfort     Will order tylenol  5. VHD     Management with attending cardiologist out pt     MV described as rheumatic, no known hx of rheumatic fever as a child, 2 siblings with VHD and CHF   For questions or updates, please contact [facility] Please consult [email or web address] for contact info under        Signed, Baldwin Jamaica, PA-C  11/03/2020, 8:40 AM     I have seen, examined the patient, and reviewed the above assessment and plan.  Changes to above are made where necessary.  On exam, iRRR.  Started on tikosyn.  We will follow qt. Given extensive MR, I am not  convinced that we will be successful in managing her arrhythmia medically.  Will see how she does with tikosyn load.  Co Sign: Thompson Grayer, MD 11/03/2020 1:44 PM

## 2020-11-03 NOTE — Plan of Care (Signed)
  Problem: Education: Goal: Knowledge of General Education information will improve Description Including pain rating scale, medication(s)/side effects and non-pharmacologic comfort measures Outcome: Progressing   Problem: Education: Goal: Knowledge of disease or condition will improve Outcome: Progressing Goal: Understanding of medication regimen will improve Outcome: Progressing   Problem: Activity: Goal: Ability to tolerate increased activity will improve Outcome: Progressing   Problem: Cardiac: Goal: Ability to achieve and maintain adequate cardiopulmonary perfusion will improve Outcome: Progressing   

## 2020-11-03 NOTE — TOC Benefit Eligibility Note (Signed)
Transition of Care [facility]) Benefit Eligibility Note    Patient Details  Name: [name] MRN: [id number] Date of Birth: Aug 08, 1956   Medication/Dose: Dofetilide(Tikosyn) 500 mcg,234mcg,125mcg  Covered?: Yes  Tier: 3 Drug  Prescription Coverage [facility]  Spoke with Person/Company/Phone Number:: Tonya H. W[employer]. Phone # [phone]  Co-Pay: Zero  Prior Approval: No  Deductible: Met       Shelda Altes Phone Number: 11/03/2020, 8:51 AM

## 2020-11-03 NOTE — Progress Notes (Signed)
Pharmacy: Dofetilide (Tikosyn) - Follow Up Assessment and Electrolyte Replacement  Pharmacy consulted to assist in monitoring and replacing electrolytes in this 65 y.o. female admitted on 11/02/2020 undergoing dofetilide initiation.  Labs:    Component Value Date/Time   K 4.0 11/03/2020 0311   MG 2.2 11/03/2020 4239     Plan: Potassium: K >/= 4: No additional supplementation needed  3/15: kdur 142meq total 3/16: kdur 26meq  Magnesium: Mg > 2: No additional supplementation needed    Thank you for allowing pharmacy to participate in this patient's care   Hildred Laser, PharmD Clinical Pharmacist **Pharmacist phone directory can now be found on [email or web address] ([email or web address]).  Listed under [place].

## 2020-11-04 ENCOUNTER — Encounter (HOSPITAL_COMMUNITY)
Admission: RE | Disposition: A | Discharge status: Home / Self Care | Payer: Self-pay | Source: Ambulatory Visit | Attending: Internal Medicine

## 2020-11-04 ENCOUNTER — Inpatient Hospital Stay (HOSPITAL_COMMUNITY): Payer: Medicare Other | Admitting: Anesthesiology

## 2020-11-04 ENCOUNTER — Encounter (HOSPITAL_COMMUNITY): Payer: Self-pay | Admitting: Internal Medicine

## 2020-11-04 DIAGNOSIS — I4819 Other persistent atrial fibrillation: Secondary | ICD-10-CM

## 2020-11-04 HISTORY — PX: CARDIOVERSION: SHX1299

## 2020-11-04 LAB — GLUCOSE, CAPILLARY
Glucose-Capillary: 120 mg/dL — ABNORMAL HIGH (ref 70–99)
Glucose-Capillary: 122 mg/dL — ABNORMAL HIGH (ref 70–99)
Glucose-Capillary: 129 mg/dL — ABNORMAL HIGH (ref 70–99)
Glucose-Capillary: 139 mg/dL — ABNORMAL HIGH (ref 70–99)

## 2020-11-04 LAB — BASIC METABOLIC PANEL
Anion gap: 8 (ref 5–15)
BUN: 15 mg/dL (ref 8–23)
CO2: 28 mmol/L (ref 22–32)
Calcium: 8.6 mg/dL — ABNORMAL LOW (ref 8.9–10.3)
Chloride: 104 mmol/L (ref 98–111)
Creatinine, Ser: 1.5 mg/dL — ABNORMAL HIGH (ref 0.44–1.00)
GFR, Estimated: 39 mL/min — ABNORMAL LOW (ref >60)
Glucose, Bld: 137 mg/dL — ABNORMAL HIGH (ref 70–99)
Potassium: 3.6 mmol/L (ref 3.5–5.1)
Sodium: 140 mmol/L (ref 135–145)

## 2020-11-04 LAB — MAGNESIUM: Magnesium: 2 mg/dL (ref 1.7–2.4)

## 2020-11-04 SURGERY — CARDIOVERSION
Anesthesia: General

## 2020-11-04 MED ORDER — ZOLPIDEM TARTRATE 5 MG PO TABS
5.0000 mg | ORAL_TABLET | Freq: Every evening | ORAL | Status: AC | PRN
  Administered 2020-11-04: 5 mg via ORAL
  Filled 2020-11-04: qty 1

## 2020-11-04 MED ORDER — DOFETILIDE 250 MCG PO CAPS
250.0000 ug | ORAL_CAPSULE | Freq: Two times a day (BID) | ORAL | Status: DC
  Administered 2020-11-04: 250 ug via ORAL
  Filled 2020-11-04: qty 1

## 2020-11-04 MED ORDER — PROPOFOL 10 MG/ML IV BOLUS
INTRAVENOUS | Status: DC | PRN
  Administered 2020-11-04: 60 mg via INTRAVENOUS

## 2020-11-04 MED ORDER — MAGNESIUM SULFATE IN D5W 1-5 GM/100ML-% IV SOLN
1.0000 g | Freq: Once | INTRAVENOUS | Status: AC
  Administered 2020-11-04: 1 g via INTRAVENOUS
  Filled 2020-11-04: qty 100

## 2020-11-04 MED ORDER — POTASSIUM CHLORIDE CRYS ER 20 MEQ PO TBCR
40.0000 meq | EXTENDED_RELEASE_TABLET | ORAL | Status: AC
  Administered 2020-11-04 (×2): 40 meq via ORAL
  Filled 2020-11-04 (×2): qty 2

## 2020-11-04 MED ORDER — SODIUM CHLORIDE 0.9 % IV SOLN: INTRAVENOUS | Status: DC | PRN

## 2020-11-04 MED ORDER — LIDOCAINE 2% (20 MG/ML) 5 ML SYRINGE
INTRAMUSCULAR | Status: DC | PRN
  Administered 2020-11-04: 60 mg via INTRAVENOUS

## 2020-11-04 MED ORDER — POTASSIUM CHLORIDE CRYS ER 20 MEQ PO TBCR
20.0000 meq | EXTENDED_RELEASE_TABLET | Freq: Every day | ORAL | Status: DC
  Administered 2020-11-05: 20 meq via ORAL
  Filled 2020-11-04 (×2): qty 1

## 2020-11-04 NOTE — Interval H&P Note (Signed)
History and Physical Interval Note:  11/04/2020 9:17 AM  [name]  has presented today for surgery, with the diagnosis of afib.  The various methods of treatment have been discussed with the patient and family. After consideration of risks, benefits and other options for treatment, the patient has consented to  Procedure(s): CARDIOVERSION (N/A) as a surgical intervention.  The patient's history has been reviewed, patient examined, no change in status, stable for surgery.  I have reviewed the patient's chart and labs.  Questions were answered to the patient's satisfaction.     Kirk Ruths

## 2020-11-04 NOTE — Telephone Encounter (Signed)
[facility] called stating they still have not received the orders for her shoe inserts, Caller stated she will re-fax everything over.

## 2020-11-04 NOTE — Progress Notes (Addendum)
Progress Note  Patient Name: [name] Date of Encounter: 11/04/2020  [facility] Cardiologist: Buford Dresser, MD   Subjective   Sleeping comfortably as I enter, on CPAP, easily woken, slept well last night, no complaints  Inpatient Medications    Scheduled Meds: . cycloSPORINE  1 drop Both Eyes BID  . dabigatran  150 mg Oral Q12H  . digoxin  0.125 mg Oral Daily  . dofetilide  500 mcg Oral BID  . DULoxetine  60 mg Oral Daily  . furosemide  40 mg Oral BID  . gabapentin  600 mg Oral BID  . insulin aspart  0-5 Units Subcutaneous QHS  . insulin aspart  0-9 Units Subcutaneous TID WC  . insulin detemir  25 Units Subcutaneous BID  . metoprolol succinate  100 mg Oral Daily  . pantoprazole  40 mg Oral BID  . rosuvastatin  10 mg Oral Daily  . sodium chloride flush  3 mL Intravenous Q12H   Continuous Infusions: . sodium chloride     PRN Meds: sodium chloride, acetaminophen, albuterol, hydrocortisone cream, nitroGLYCERIN, sodium chloride flush   Vital Signs    Vitals:   11/03/20 0414 11/03/20 0736 11/03/20 1611 11/04/20 0458  BP: (!) 147/87 (!) 156/121 (!) 111/97 (!) 127/92  Pulse: 75 96 74 73  Resp: 19 20 20 18   Temp: 98 F (36.7 C) 97.6 F (36.4 C) 97.6 F (36.4 C) 98 F (36.7 C)  TempSrc: Oral Oral Oral Axillary  SpO2:  97% 99% 100%  Weight: (!) 136.9 kg   (!) 139.3 kg  Height:        Intake/Output Summary (Last 24 hours) at 11/04/2020 0742 Last data filed at 11/03/2020 2140 Gross per 24 hour  Intake 483 ml  Output --  Net 483 ml   Last 3 Weights 11/04/2020 11/03/2020 11/02/2020  Weight (lbs) 307 lb 1.6 oz 301 lb 13 oz 302 lb 8 oz  Weight (kg) 139.3 kg 136.9 kg 137.213 kg  Some encounter information is confidential and restricted. Go to Review Flowsheets activity to see all data.      Telemetry    AFib 70's-80's, occ PVCs (noted prior to drug as well), rare couplet - Personally Reviewed  ECG    Afib 70bpm, QT is stable - Personally  Reviewed  Physical Exam   GEN: No acute distress.   Neck: No JVD Cardiac: irreg-irreg, 2/6 SM w/diastolic component, no murmurs, rubs, or gallops.  Respiratory: CTA b/l. GI: Soft, nontender, non-distended  MS: No edema; No deformity. Neuro:  Nonfocal  Psych: Normal affect   Labs    High Sensitivity Troponin:  No results for input(s): TROPONINIHS in the last 720 hours.    Chemistry Recent Labs  Lab 11/02/20 1119 11/02/20 1824 11/03/20 0311 11/04/20 0221  NA 142  --  140 140  K 3.4* 3.9 4.0 3.6  CL 105  --  104 104  CO2 30  --  30 28  GLUCOSE 89  --  131* 137*  BUN 16  --  15 15  CREATININE 1.68*  --  1.57* 1.50*  CALCIUM 9.1  --  8.9 8.6*  GFRNONAA 34*  --  37* 39*  ANIONGAP 7  --  6 8     HematologyNo results for input(s): WBC, RBC, HGB, HCT, MCV, MCH, MCHC, RDW, PLT in the last 168 hours.  BNPNo results for input(s): BNP, PROBNP in the last 168 hours.   DDimer No results for input(s): DDIMER in the last 168  hours.   Radiology    No results found.  Cardiac Studies    07/08/20: TTE IMPRESSIONS  1. Left ventricular ejection fraction, by estimation, is 45 to 50%. The  left ventricle has mildly decreased function. The left ventricle  demonstrates global hypokinesis. Left ventricular diastolic function could  not be evaluated.  2. Right ventricular systolic function was not well visualized. The right  ventricular size is moderately enlarged. There is normal pulmonary artery  systolic pressure.  3. Left atrial size was mildly dilated.  4. Right atrial size was moderately dilated.  5. Eccentric MR jet; appears mild-moderate on parasternal views, but on  apical views (see images 52, 66, 72) extends into distal atrium and may  have Coanda effect. Incompletely visualized and PV not sampled, suspect  moderate to severe MR.. The mitral  valve is rheumatic. Moderate to severe mitral valve regurgitation.  6. Tricuspid valve regurgitation is severe.  7. The  aortic valve is tricuspid. Aortic valve regurgitation is not  visualized. No aortic stenosis is present.  8. The inferior vena cava is normal in size with <50% respiratory  variability, suggesting right atrial pressure of 8 mmHg.   Comparison(s): No significant change from prior study.    12/31/2017: LHC  Prox LAD lesion is 10% stenosed.  Dist LM to Ost LAD lesion is 10% stenosed.  Ost Cx lesion is 15% stenosed.  The left ventricular systolic function is normal.  LV end diastolic pressure is normal.    Patient Profile     65 y.o. 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  Assessment & Plan    1. Longstanding persistent Afib     CHA2DS2Vasc is 4, on Pradaxa, appropriately dosed     Tikosyn load is in progress     K+ 3.6 replaced     Mag 2.0     Creat 1.50, stable, Tikosyn appropriately dosed     QT stable  She required significant electrolyte replacement to get started, likely will need some supplementation going home  DCCV today  2. DM     Appreciate DM coordinator assist  3. OSA     She mentions that she tends to not use every night, discussed importance of this     Will order for here  4. CP     Very much reproducible with palpation of her chest wall,  c/w musculoskeletal discomfort     Will order tylenol  5. VHD     Management with attending cardiologist out pt     MV described as rheumatic, no known hx of rheumatic fever as a child, 2 siblings with VHD and CHF   For questions or updates, please contact [facility] Please consult [email or web address] for contact info under        Signed, Baldwin Jamaica, PA-C  11/04/2020, 7:42 AM    I have seen, examined the patient, and reviewed the above assessment and plan.  Changes to above are made where necessary.  On exam, RRR.  Qt appears stable but she has had short  nonsustained polymorphic VT. Reduce tikosyn to 250 mcg BID and follow closely. Check dig level.   Co Sign: Thompson Grayer, MD 11/04/2020 8:53 PM

## 2020-11-04 NOTE — Anesthesia Preprocedure Evaluation (Addendum)
Anesthesia Evaluation  Patient identified by MRN, date of birth, ID band Patient awake    Reviewed: Allergy & Precautions, NPO status , Patient's Chart, lab work & pertinent test results  History of Anesthesia Complications Negative for: history of anesthetic complications  Airway Mallampati: II  TM Distance: >3 FB Neck ROM: Full    Dental  (+) Dental Advisory Given   Pulmonary asthma , sleep apnea and Continuous Positive Airway Pressure Ventilation , COPD,  COPD inhaler,    Pulmonary exam normal        Cardiovascular hypertension, Pt. on medications and Pt. on home beta blockers +CHF and + DVT  + dysrhythmias Atrial Fibrillation + Valvular Problems/Murmurs MR  Rhythm:Irregular Rate:Normal + Systolic murmurs  '21 TTE - EF 45 to 50%. Global hypokinesis. The right ventricular size is moderately enlarged. Left atrial size was mildly dilated. Right atrial size was moderately dilated. Eccentric MR jet; appears mild-moderate on parasternal views, but on apical views (see images 52, 66, 72) extends into distal atrium and may have Coanda effect. Suspect moderate to severe MR. Tricuspid valve regurgitation is severe.     Neuro/Psych  Headaches, PSYCHIATRIC DISORDERS Depression    GI/Hepatic Neg liver ROS, GERD  Controlled,  Endo/Other  diabetes, Type 2, Insulin DependentMorbid obesity  Renal/GU Renal InsufficiencyRenal disease     Musculoskeletal  (+) Arthritis ,   Abdominal (+) + obese,   Peds  Hematology  On pradaxa    Anesthesia Other Findings Covid test negative   Reproductive/Obstetrics                            Anesthesia Physical Anesthesia Plan  ASA: IV  Anesthesia Plan: General   Post-op Pain Management:    Induction: Intravenous  PONV Risk Score and Plan: 3 and Treatment may vary due to age or medical condition and Propofol infusion  Airway Management Planned: Mask and Natural  Airway  Additional Equipment: None  Intra-op Plan:   Post-operative Plan:   Informed Consent: I have reviewed the patients History and Physical, chart, labs and discussed the procedure including the risks, benefits and alternatives for the proposed anesthesia with the patient or authorized representative who has indicated his/her understanding and acceptance.       Plan Discussed with: CRNA and Anesthesiologist  Anesthesia Plan Comments:        Anesthesia Quick Evaluation

## 2020-11-04 NOTE — Anesthesia Postprocedure Evaluation (Signed)
Anesthesia Post Note  Patient: [name]  Procedure(s) Performed: CARDIOVERSION (N/A )     Patient location during evaluation: PACU Anesthesia Type: General Level of consciousness: awake and alert Pain management: pain level controlled Vital Signs Assessment: post-procedure vital signs reviewed and stable Respiratory status: spontaneous breathing, nonlabored ventilation and respiratory function stable Cardiovascular status: blood pressure returned to baseline and stable Postop Assessment: no apparent nausea or vomiting Anesthetic complications: no   No complications documented.  Last Vitals:  Vitals:   11/04/20 0959 11/04/20 1030  BP: 127/68 123/60  Pulse:  63  Resp:  16  Temp:  36.7 C  SpO2:  98%    Last Pain:  Vitals:   11/04/20 1030  TempSrc: Oral  PainSc: 0-No pain                 [name]

## 2020-11-04 NOTE — Procedures (Signed)
Electrical Cardioversion Procedure Note [name] [id number] 09-29-1955  Procedure: Electrical Cardioversion Indications:  Atrial Fibrillation  Procedure Details Consent: Risks of procedure as well as the alternatives and risks of each were explained to the (patient/caregiver).  Consent for procedure obtained. Time Out: Verified patient identification, verified procedure, site/side was marked, verified correct patient position, special equipment/implants available, medications/allergies/relevent history reviewed, required imaging and test results available.  Performed  Patient placed on cardiac monitor, pulse oximetry, supplemental oxygen as necessary.  Sedation given: Pt sedated by anesthesia with lidocaine 60 mg and diprovan 60 mg IV. Pacer pads placed anterior and posterior chest.  Cardioverted 1 time(s).  Cardioverted at [place].  Evaluation Findings: Post procedure EKG shows: NSR Complications: None Patient did tolerate procedure well.   Kirk Ruths 11/04/2020, 9:15 AM

## 2020-11-04 NOTE — Transfer of Care (Signed)
Immediate Anesthesia Transfer of Care Note  Patient: [name]  Procedure(s) Performed: CARDIOVERSION (N/A )  Patient Location: Endoscopy Unit  Anesthesia Type:General  Level of Consciousness: drowsy  Airway & Oxygen Therapy: Patient Spontanous Breathing  Post-op Assessment: Report given to RN and Post -op Vital signs reviewed and stable  Post vital signs: Reviewed and stable  Last Vitals:  Vitals Value Taken Time  BP 114/64 (79)   Temp    Pulse 68   Resp 16   SpO2 100%     Last Pain:  Vitals:   11/04/20 0907  TempSrc: Oral  PainSc: 7       Patients Stated Pain Goal: 10 (78/46/96 2952)  Complications: No complications documented.

## 2020-11-04 NOTE — Progress Notes (Signed)
Post DCCV reviewed with Dr. Rayann Heman QT 427ms, QTc 432ms. She has had 2 episodes of NSVT (polymorphic), 5 and 6 beats  I ordered another 1g Mag, d/w RN She got the dose this AM and got both Kdur doses as well Reviewed with Dr Rayann Heman Will reduce Tikosyn dose to 259mcg for tonight  Tommye Standard, PA-C

## 2020-11-05 ENCOUNTER — Other Ambulatory Visit: Payer: Self-pay | Admitting: Physician Assistant

## 2020-11-05 ENCOUNTER — Telehealth (INDEPENDENT_AMBULATORY_CARE_PROVIDER_SITE_OTHER): Payer: Self-pay | Admitting: Primary Care

## 2020-11-05 ENCOUNTER — Encounter (HOSPITAL_COMMUNITY): Payer: Self-pay | Admitting: Cardiology

## 2020-11-05 LAB — DIGOXIN LEVEL: Digoxin Level: 1.2 ng/mL (ref 0.8–2.0)

## 2020-11-05 LAB — BASIC METABOLIC PANEL
Anion gap: 6 (ref 5–15)
BUN: 21 mg/dL (ref 8–23)
CO2: 28 mmol/L (ref 22–32)
Calcium: 8.6 mg/dL — ABNORMAL LOW (ref 8.9–10.3)
Chloride: 103 mmol/L (ref 98–111)
Creatinine, Ser: 1.62 mg/dL — ABNORMAL HIGH (ref 0.44–1.00)
GFR, Estimated: 35 mL/min — ABNORMAL LOW (ref >60)
Glucose, Bld: 137 mg/dL — ABNORMAL HIGH (ref 70–99)
Potassium: 4.1 mmol/L (ref 3.5–5.1)
Sodium: 137 mmol/L (ref 135–145)

## 2020-11-05 LAB — GLUCOSE, CAPILLARY
Glucose-Capillary: 208 mg/dL — ABNORMAL HIGH (ref 70–99)
Glucose-Capillary: 109 mg/dL — ABNORMAL HIGH (ref 70–99)
Glucose-Capillary: 171 mg/dL — ABNORMAL HIGH (ref 70–99)

## 2020-11-05 LAB — MAGNESIUM: Magnesium: 2.4 mg/dL (ref 1.7–2.4)

## 2020-11-05 MED ORDER — POTASSIUM CHLORIDE ER 20 MEQ PO TBCR: 20.0000 meq | EXTENDED_RELEASE_TABLET | Freq: Every day | ORAL | 0 refills | Status: DC

## 2020-11-05 MED ORDER — POTASSIUM CHLORIDE ER 20 MEQ PO TBCR: 20.0000 meq | EXTENDED_RELEASE_TABLET | Freq: Every day | ORAL | 6 refills | Status: DC

## 2020-11-05 MED ORDER — MAGNESIUM OXIDE 400 MG PO CAPS: 400.0000 mg | ORAL_CAPSULE | Freq: Every day | ORAL | 6 refills | Status: DC

## 2020-11-05 MED ORDER — MAGNESIUM OXIDE 400 MG PO CAPS: 400.0000 mg | ORAL_CAPSULE | Freq: Every day | ORAL | 0 refills | Status: DC

## 2020-11-05 MED ORDER — AMIODARONE HCL 200 MG PO TABS: 200.0000 mg | ORAL_TABLET | Freq: Two times a day (BID) | ORAL | 0 refills | Status: DC

## 2020-11-05 MED ORDER — AMIODARONE HCL 200 MG PO TABS: 200.0000 mg | ORAL_TABLET | Freq: Two times a day (BID) | ORAL | 6 refills | Status: DC

## 2020-11-05 NOTE — Telephone Encounter (Signed)
Bridgette calling from the [facility] is calling regarding need inserts to be selected on the prescription for the diabetic shoes. Only the shoes were selected needing the shoes and inserts to be selected Cb- [phone]

## 2020-11-05 NOTE — Telephone Encounter (Signed)
This was faxed back on yesterday 11/04/2020.

## 2020-11-05 NOTE — Discharge Summary (Addendum)
ELECTROPHYSIOLOGY PROCEDURE DISCHARGE SUMMARY    Patient ID: [name],  MRN: [id number], DOB/AGE: 1955-09-09 65 y.o.  Admit date: 11/02/2020 Discharge date: 11/05/2020  Primary Care Physician: [name], NP  Primary Cardiologist: Dr. Harrell Gave Electrophysiologist: Dr. Rayann Heman (new this admission)  Primary Discharge Diagnosis:  1.  Longstanding persistent atrial fibrillation status post faiedTikosyn loading this admission      CHA2DS2Vasc is 4, on Pradaxa, appropriately dosed  Secondary Discharge Diagnosis:  1. Obesity 2. OSA     W/CPAP, though not always compliant     Discussed importance with her of this  3. DM 4. VHD     Mos-severe MR, severe TR  Allergies  Allergen Reactions  . Sulfa Antibiotics Itching  . Other     Other reaction(s): Unknown     Procedures This Admission:  1.  Tikosyn loading 2.  Direct current cardioversion on 11/04/20 by Dr Stanford Breed which successfully restored SR.  There were no early apparent complications.   Brief HPI: [name] is a 65 y.o. female with a past medical history as noted above.  They were referred to the AFib clinic in the outpatient setting for evaluation and Tikosyn therapy for her atrial fibrillation.  Risks, benefits, and alternatives to Tikosyn were reviewed with the patient who wished to proceed.    Hospital Course:  The patient was admitted and Tikosyn was initiated.  Renal function and electrolytes were followed during the hospitalization.  Electrolytes required persistent replacement, particularly her potassium.  The patient's QTc remained stable.  On 11/04/20 the patient underwent direct current cardioversion which restored sinus rhythm.  She was monitored until discharge on telemetry, post DCCV she despite stable QT she did develop some short nonsustained PMVT episodes and her dose reduced.  Overnight returned to AF.   She has not had any NSVT in over 24 hours, she has occasional PVCs  (known for her baseline)   On the day of discharge, she feels well, disappointed but understands, Dr. Rayann Heman discussed at length with her, and recommends at least for now, using amioadrone, discussed potential side effects and need for drug monitoring outpatient and she is agreeable.  She was examined and considered the patient stable for discharge to home.  Follow-up has been arranged with the AFib clinic in 1-2 weeks for follow up.  She came mildly hypokalemic on arrival, given her furosemide will send her with some potassium supplementation, she required daily replacement while here. Her magnesium was also low and note historically has been as well. Will prescribe mag ox for her.  Physical Exam: Vitals:   11/04/20 1151 11/04/20 2100 11/05/20 0500 11/05/20 0938  BP: 137/89 (!) 142/82 135/76 (!) 148/72  Pulse: 71 75 94 (!) 101  Resp: _0 Temp:  97.7 F (36.5 C) 97.9 F (36.6 C)   TempSrc:  Axillary Axillary   SpO2: 99% 98% 100%   Weight:   125.7 kg   Height:         GEN- The patient is well appearing, alert and oriented x 3 today.   HEENT: normocephalic, atraumatic; sclera clear, conjunctiva pink; hearing intact; oropharynx clear; neck supple, no JVP Lymph- no cervical lymphadenopathy Lungs- CTA b/l, normal work of breathing.  No wheezes, rales, rhonchi Heart- irreg-irreg, 4-8/5 SM w/diastolic component, PMI not laterally displaced GI- soft, non-tender, non-distended Extremities- no clubbing, cyanosis, or edema MS- no significant deformity or atrophy Skin- warm and dry, no rash or lesion Psych- euthymic mood, full affect  Neuro- strength and sensation are intact   Labs:   Lab Results  Component Value Date   WBC 7.7 05/03/2020   HGB 15.3 (H) 05/06/2020   HCT 45.0 05/06/2020   MCV 86 05/03/2020   PLT 236 05/03/2020    Recent Labs  Lab 11/05/20 0315  NA 137  K 4.1  CL 103  CO2 28  BUN 21  CREATININE 1.62*  CALCIUM 8.6*  GLUCOSE 137*     Discharge  Medications:  Allergies as of 11/05/2020      Reactions   Sulfa Antibiotics Itching   Other    Other reaction(s): Unknown      Medication List    TAKE these medications   Accu-Chek Guide Me w/Device Kit Use as instructed to check blood sugar three times daily. E11.69   Accu-Chek Guide w/Device Kit USE AS INSTRUCTED TO CHECK BLOOD SUGAR THREE TIMES DAILY. E11.69   Accu-Chek Softclix Lancets lancets Use as instructed to check blood sugar three times daily. E11.69   albuterol 108 (90 Base) MCG/ACT inhaler Commonly known as: VENTOLIN HFA Inhale 2 puffs into the lungs every 4 (four) hours as needed for wheezing or shortness of breath.   amiodarone 200 MG tablet Commonly known as: Pacerone Take 1 tablet (200 mg total) by mouth 2 (two) times daily. Notes to patient: Do NOT start until Sunday 11/07/20   aspirin-sod bicarb-citric acid 325 MG Tbef tablet Commonly known as: ALKA-SELTZER Take 325 mg by mouth every 6 (six) hours as needed (indigestion).   B-D SINGLE USE SWABS REGULAR Pads   dabigatran 150 MG Caps capsule Commonly known as: Pradaxa Take 1 capsule (150 mg total) by mouth 2 (two) times daily.   digoxin 0.125 MG tablet Commonly known as: LANOXIN Take 0.125 mg by mouth daily.   DULoxetine 60 MG capsule Commonly known as: CYMBALTA Take 1 capsule (60 mg total) by mouth daily.   EPINEPHrine 0.3 mg/0.3 mL Soaj injection Commonly known as: EPI-PEN Inject 0.3 mg into the muscle as needed for anaphylaxis.   furosemide 40 MG tablet Commonly known as: Lasix Take 1 tablet (40 mg total) by mouth 2 (two) times daily.   gabapentin 300 MG capsule Commonly known as: NEURONTIN Take 2 capsules (600 mg total) by mouth 2 (two) times daily.   insulin aspart 100 UNIT/ML injection Commonly known as: NovoLOG Inject 12 Units into the skin 3 (three) times daily before meals. For blood sugars 0-199 give 0 units of insulin, 201-250 give 4 units, 251-300 give 6 units, 301-350 give 8  units, 351-400 give 10 units,> 400 give 12 units and call M.D.   Levemir 100 UNIT/ML injection Generic drug: insulin detemir Inject 50 Units into the skin 2 (two) times daily.   Magnesium Oxide 400 MG Caps Take 1 capsule (400 mg total) by mouth daily.   metoprolol succinate 100 MG 24 hr tablet Commonly known as: TOPROL-XL Take 100 mg by mouth in the morning and at bedtime. Take with or immediately following a meal.   MULTIVITAMIN WOMEN PO Take 1 tablet by mouth daily.   nitroGLYCERIN 0.4 MG SL tablet Commonly known as: NITROSTAT Place 1 tablet (0.4 mg total) under the tongue every 5 (five) minutes x 3 doses as needed for chest pain.   Potassium Chloride ER 20 MEQ Tbcr Take 20 mEq by mouth daily.   Restasis 0.05 % ophthalmic emulsion Generic drug: cycloSPORINE Place 1 drop into both eyes 2 (two) times daily.   rosuvastatin 10 MG tablet Commonly known  as: CRESTOR Take 1 tablet (10 mg total) by mouth daily.   True Metrix Blood Glucose Test test strip Generic drug: glucose blood USE AS INSTRUCTED TO CHECK BLOOD SUGAR DAILY.   Trulicity 1.5 HX/5.0VW Sopn Generic drug: Dulaglutide Inject 1.5 mg into the skin once a week.       Disposition:  Discharge Instructions    Diet - low sodium heart healthy   Complete by: As directed    Increase activity slowly   Complete by: As directed       Follow-up Information    [facility].   Specialty: Cardiology Why: 11/17/2020 @ 1:30PM with Maximino Greenland, NP.  (please do not take your digoxin the morning of this visit to get a digoxin level drawn while there) Contact information: [address] [id number] [place] [phone]              Duration of Discharge Encounter: Greater than 30 minutes including physician time.  Venetia Night, PA-C 11/05/2020 2:27 PM

## 2020-11-05 NOTE — Plan of Care (Signed)
  Problem: Education: Goal: Knowledge of General Education information will improve Description: Including pain rating scale, medication(s)/side effects and non-pharmacologic comfort measures Outcome: Adequate for Discharge   Problem: Education: Goal: Knowledge of disease or condition will improve Outcome: Adequate for Discharge Goal: Understanding of medication regimen will improve Outcome: Adequate for Discharge Goal: Individualized Educational Video(s) Outcome: Adequate for Discharge   Problem: Activity: Goal: Ability to tolerate increased activity will improve Outcome: Adequate for Discharge   Problem: Cardiac: Goal: Ability to achieve and maintain adequate cardiopulmonary perfusion will improve Outcome: Adequate for Discharge   Problem: Health Behavior/Discharge Planning: Goal: Ability to safely manage health-related needs after discharge will improve Outcome: Adequate for Discharge

## 2020-11-05 NOTE — Care Management Important Message (Signed)
Important Message  Patient Details  Name: [name] MRN: [id number] Date of Birth: [birth year]   Medicare Important Message Given:  Yes     Riddik Senna [address] 11/05/2020, 12:47 PM

## 2020-11-06 ENCOUNTER — Telehealth: Payer: Self-pay | Admitting: Physician Assistant

## 2020-11-06 NOTE — Telephone Encounter (Signed)
65 y.o. 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  She was just discharged 11/05/2020 after admission for dofetilide loading.  She had return of atrial fibrillation after cardioversion and was placed on amiodarone.  She called the answering service today because she has noted increased swelling as well as increase shortness of breath.  Her weight is up 5 pounds. PLAN:  Furosemide 80 mg tonight and again tomorrow AM. Ok to take another dose of Furosemide 80 mg tomorrow PM. Then resume furosemide 40 mg twice daily. Will have RN call on Monday to check on her. Richardson Dopp, PA-C    11/06/2020 3:55 PM

## 2020-11-08 ENCOUNTER — Other Ambulatory Visit: Payer: Self-pay | Admitting: Cardiology

## 2020-11-08 NOTE — Telephone Encounter (Signed)
Spoke with pt, aware of the recommendations. She reports her weight is the same 301 lb. She will call back if no change in swelling or weight.

## 2020-11-08 NOTE — Telephone Encounter (Signed)
Spoke with pt, she has no weighed this morning. She reports little change in the swelling in her feet. She reports SOB and having to sleep on 3 pillows to breathe. She took 80 mg of furosemide Saturday pm and Sunday am. Dr Harrell Gave is not in the office this week and no app appointments available. Will forward to DOD to advise.

## 2020-11-08 NOTE — Telephone Encounter (Signed)
Would have her take 80 mg twice daily for a few days or until the 5 lbs of weight is lost, then go back to 80 mg daily  Dr Lemmie Evens

## 2020-11-09 MED ORDER — APIXABAN 5 MG PO TABS: 5.0000 mg | ORAL_TABLET | Freq: Two times a day (BID) | ORAL | 1 refills | Status: DC

## 2020-11-09 NOTE — Telephone Encounter (Signed)
Received message from pharmacy that Pradaxa is not on patient's formulary. Insurance will cover Eliquis instead. Called pt who does not recall having side effects to any blood thinners in the past. She is agreeable to change to Eliquis. She did just undergo DCCV/Tikosyn load last week. Advised pt to continue the rest of her Pradaxa until she runs out (she was unsure how many capsules she had left), and to then start Eliquis 5mg  BID with first dose 12 hours after last dose of Pradaxa to ensure no lapse in therapy.

## 2020-11-10 ENCOUNTER — Telehealth: Payer: Self-pay | Admitting: Cardiology

## 2020-11-10 NOTE — Telephone Encounter (Signed)
Spoke with Rep and informed order would have to be signed by PCP. Rep verbalized understanding.

## 2020-11-10 NOTE — Telephone Encounter (Signed)
Bridgette with [facility] states they received a prescription.  She states on the prescription the diabetic shoes are circled, but we also need to circle the shoe inserts portion.  She states they also need the prescribing physician's NPI.

## 2020-11-16 ENCOUNTER — Telehealth: Payer: Self-pay | Admitting: Cardiology

## 2020-11-16 NOTE — Telephone Encounter (Signed)
Pt c/o swelling: STAT is pt has developed SOB within 24 hours  1) How much weight have you gained and in what time span? 20+lbs in a couple of weeks  2) If swelling, where is the swelling located? Legs,face, fluid is coming out ears and eyes  3) Are you currently taking a fluid pill? yes  4) Are you currently SOB? yes  5) Do you have a log of your daily weights (if so, list)? Pt is currently in [place] and she does not have that info with her  6) Have you gained 3 pounds in a day or 5 pounds in a week? yes  7) Have you traveled recently?yes,  Currently in [place]

## 2020-11-16 NOTE — Telephone Encounter (Signed)
Called patient she states that she has still had continued swelling and SOB, patient states that she is still on Lasix 80 mg twice daily, and continued to have swelling. Patient states that she has not weighed because she is currently in [place] with her daughter and they do not have one. Patient denies any changes in diet, does mention SOB with activities. Went and spoke to [place] who advised that patient should be seen in office to determine what is going on, continue current 80 mg twice daily for now. Patient returns from [place] on Friday, appointment made with MD on 04/04. Will route to make aware.  Patient verbalized understanding.

## 2020-11-17 ENCOUNTER — Ambulatory Visit (HOSPITAL_COMMUNITY): Payer: Medicare Other | Admitting: Nurse Practitioner

## 2020-11-18 ENCOUNTER — Other Ambulatory Visit (INDEPENDENT_AMBULATORY_CARE_PROVIDER_SITE_OTHER): Payer: Self-pay | Admitting: Primary Care

## 2020-11-18 NOTE — Telephone Encounter (Signed)
   Notes to clinic:  medication was filled by a historical provider  Review for refill    Requested Prescriptions  Pending Prescriptions Disp Refills   LEVEMIR 100 UNIT/ML injection [Pharmacy Med Name: LEVEMIR 100 UNIT/ML VIAL] 60 mL 1    Sig: INJECT 0.4 MLS (40 UNITS TOTAL) INTO THE SKIN 2 (TWO) TIMES DAILY.      Endocrinology:  Diabetes - Insulins Failed - 11/18/2020 12:28 PM      Failed - HBA1C is between 0 and 7.9 and within 180 days    HbA1c, POC (controlled diabetic range)  Date Value Ref Range Status  09/24/2020 9.9 (A) 0.0 - 7.0 % Final   Hgb A1c MFr Bld  Date Value Ref Range Status  11/02/2020 9.3 (H) 4.8 - 5.6 % Final    Comment:    (NOTE) Pre diabetes:          5.7%-6.4%  Diabetes:              >6.4%  Glycemic control for   <7.0% adults with diabetes           Passed - Valid encounter within last 6 months    Recent Outpatient Visits           1 month ago Diabetes mellitus type 2 in obese ([place])   [place], Michelle P, NP   4 months ago Type 2 diabetes mellitus without complication, without long-term current use of insulin ([place])   [place], [place] P, NP   6 months ago Diabetes mellitus type 2 in obese [facility])   [place], Annie [place] L, RPH-CPP   7 months ago Diabetes mellitus type 2 in obese [facility])   [place], Stephen L, RPH-CPP   7 months ago Hospital discharge follow-up   [place], [place], NP       Future Appointments             In 4 days Buford Dresser, MD [place], [place]   In 3 weeks Buford Dresser, MD [facility] [place], [place]   In 1 month [place], [place] Cage, NP [place]

## 2020-11-22 ENCOUNTER — Other Ambulatory Visit: Payer: Self-pay

## 2020-11-22 ENCOUNTER — Other Ambulatory Visit: Payer: Self-pay | Admitting: Cardiology

## 2020-11-22 ENCOUNTER — Encounter: Payer: Self-pay | Admitting: Cardiology

## 2020-11-22 ENCOUNTER — Ambulatory Visit (INDEPENDENT_AMBULATORY_CARE_PROVIDER_SITE_OTHER): Payer: Medicare Other | Admitting: Cardiology

## 2020-11-22 ENCOUNTER — Telehealth: Payer: Self-pay | Admitting: Cardiology

## 2020-11-22 VITALS — BP 124/76 | HR 80 | Ht 67.0 in | Wt 307.0 lb

## 2020-11-22 DIAGNOSIS — I340 Nonrheumatic mitral (valve) insufficiency: Secondary | ICD-10-CM | POA: Diagnosis not present

## 2020-11-22 DIAGNOSIS — R079 Chest pain, unspecified: Secondary | ICD-10-CM | POA: Diagnosis not present

## 2020-11-22 DIAGNOSIS — Z01812 Encounter for preprocedural laboratory examination: Secondary | ICD-10-CM

## 2020-11-22 DIAGNOSIS — R609 Edema, unspecified: Secondary | ICD-10-CM

## 2020-11-22 DIAGNOSIS — R0600 Dyspnea, unspecified: Secondary | ICD-10-CM

## 2020-11-22 DIAGNOSIS — D6869 Other thrombophilia: Secondary | ICD-10-CM

## 2020-11-22 DIAGNOSIS — I5042 Chronic combined systolic (congestive) and diastolic (congestive) heart failure: Secondary | ICD-10-CM

## 2020-11-22 DIAGNOSIS — R0609 Other forms of dyspnea: Secondary | ICD-10-CM

## 2020-11-22 DIAGNOSIS — I071 Rheumatic tricuspid insufficiency: Secondary | ICD-10-CM

## 2020-11-22 DIAGNOSIS — I428 Other cardiomyopathies: Secondary | ICD-10-CM

## 2020-11-22 DIAGNOSIS — I4811 Longstanding persistent atrial fibrillation: Secondary | ICD-10-CM

## 2020-11-22 MED ORDER — ASPIRIN 81 MG PO CHEW
324.0000 mg | CHEWABLE_TABLET | Freq: Once | ORAL | Status: AC
  Administered 2020-11-22: 324 mg via ORAL

## 2020-11-22 MED ORDER — TORSEMIDE 40 MG PO TABS: 40.0000 mg | ORAL_TABLET | Freq: Two times a day (BID) | ORAL | 11 refills | Status: DC

## 2020-11-22 MED ORDER — NITROGLYCERIN 0.4 MG SL SUBL
0.4000 mg | SUBLINGUAL_TABLET | SUBLINGUAL | Status: DC | PRN
Start: 2020-11-22 — End: 2020-12-20
  Administered 2020-11-22: 0.4 mg via SUBLINGUAL

## 2020-11-22 NOTE — Progress Notes (Deleted)
Cardiology Office Note:    Date:  11/22/2020   ID:  Allen Basista, DOB [birth year], MRN [id number]  PCP:  [name], NP  Cardiologist:  Buford Dresser, MD  Referring MD: [name], NP   CC: follow up  History of Present Illness:    [name] is a 64 y.o. female with a hx of atrial fibrillation, chronic systolic and diastolic heart failure, type II diabetes who is seen for follow up today. I initially saw her 03/31/20 as a new consult at the request of [name], NP for the evaluation and management of atrial fibrillation, heart failure.  Today: Admitted for tikosyn load, had return of atrial fibrillation after cardioversion, changed to amiodarone. Has been having issues with swelling and shortness of breath. Dr. Debara Pickett recommended taking BID dosing of her diuretic until symptoms improved (call date 11/08/20). Also changed from pradaxa to apixaban as insurance did not cover pradaxa.   We discussed her mitral regurgitation today. Had hoped to get TEE when in sinus rhythm to accurately evaluate MR jet. She may need MVR/MAZE/LAA clip if MR is severe.  Past Medical History:  Diagnosis Date  . Allergic rhinitis   . Arthritis   . Asthma   . Chest pain 12/27/2017  . Chronic diastolic CHF (congestive heart failure) ([place])   . COPD (chronic obstructive pulmonary disease) ([place])   . Depression   . DM (diabetes mellitus) ([place])   . DVT (deep vein thrombosis) in pregnancy   . Dysrhythmia    atrial fibrilation  . GERD (gastroesophageal reflux disease)   . Headache(784.0)   . HTN (hypertension)   . Hyperlipidemia   . Obesity   . Pneumonia 04/2018   RIGHT LOBE  . Shortness of breath   . Sleep apnea    compliant with CPAP    Past Surgical History:  Procedure Laterality Date  . ABDOMINAL HYSTERECTOMY    . CARDIOVERSION N/A 05/06/2020   Procedure: CARDIOVERSION;  Surgeon: Buford Dresser, MD;  Location: [facility] ENDOSCOPY;  Service:  Cardiovascular;  Laterality: N/A;  . CARDIOVERSION N/A 11/04/2020   Procedure: CARDIOVERSION;  Surgeon: Lelon Perla, MD;  Location: [facility] ENDOSCOPY;  Service: Cardiovascular;  Laterality: N/A;  . CATARACT EXTRACTION, BILATERAL    . CHOLECYSTECTOMY    . COLONOSCOPY WITH PROPOFOL N/A 03/05/2015   Procedure: COLONOSCOPY WITH PROPOFOL;  Surgeon: Carol Ada, MD;  Location: [facility] ENDOSCOPY;  Service: Endoscopy;  Laterality: N/A;  . DIAGNOSTIC LAPAROSCOPY    . ingrown hallux Left   . KNEE SURGERY    . LEFT HEART CATH AND CORONARY ANGIOGRAPHY N/A 12/31/2017   Procedure: LEFT HEART CATH AND CORONARY ANGIOGRAPHY;  Surgeon: Charolette Forward, MD;  Location: [place] CV LAB;  Service: Cardiovascular;  Laterality: N/A;    Current Medications: Current Outpatient Medications on File Prior to Visit  Medication Sig  . Accu-Chek Softclix Lancets lancets Use as instructed to check blood sugar three times daily. E11.69  . albuterol (VENTOLIN HFA) 108 (90 Base) MCG/ACT inhaler Inhale 2 puffs into the lungs every 4 (four) hours as needed for wheezing or shortness of breath.   . Alcohol Swabs (B-D SINGLE USE SWABS REGULAR) PADS   . amiodarone (PACERONE) 200 MG tablet Take 1 tablet (200 mg total) by mouth 2 (two) times daily.  Marland Kitchen amiodarone (PACERONE) 200 MG tablet TAKE 1 TABLET (200 MG TOTAL) BY MOUTH TWO TIMES DAILY.  Marland Kitchen apixaban (ELIQUIS) 5 MG TABS tablet Take 1 tablet (5 mg total) by mouth 2 (two)  times daily.  Marland Kitchen aspirin-sod bicarb-citric acid (ALKA-SELTZER) 325 MG TBEF tablet Take 325 mg by mouth every 6 (six) hours as needed (indigestion).  . Blood Glucose Monitoring Suppl (ACCU-CHEK GUIDE ME) w/Device KIT Use as instructed to check blood sugar three times daily. E11.69  . Blood Glucose Monitoring Suppl (ACCU-CHEK GUIDE) w/Device KIT USE AS INSTRUCTED TO CHECK BLOOD SUGAR THREE TIMES DAILY. E11.69  . digoxin (LANOXIN) 0.125 MG tablet Take 0.125 mg by mouth daily.   . Dulaglutide (TRULICITY) 1.5 VZ/8.5YI SOPN  Inject 1.5 mg into the skin once a week.  . DULoxetine (CYMBALTA) 60 MG capsule Take 1 capsule (60 mg total) by mouth daily.  Marland Kitchen EPINEPHrine 0.3 mg/0.3 mL IJ SOAJ injection Inject 0.3 mg into the muscle as needed for anaphylaxis.  . furosemide (LASIX) 40 MG tablet Take 1 tablet (40 mg total) by mouth 2 (two) times daily.  Marland Kitchen gabapentin (NEURONTIN) 300 MG capsule Take 2 capsules (600 mg total) by mouth 2 (two) times daily.  . insulin aspart (NOVOLOG) 100 UNIT/ML injection Inject 12 Units into the skin 3 (three) times daily before meals. For blood sugars 0-199 give 0 units of insulin, 201-250 give 4 units, 251-300 give 6 units, 301-350 give 8 units, 351-400 give 10 units,> 400 give 12 units and call M.D.  . LEVEMIR 100 UNIT/ML injection Inject 50 Units into the skin 2 (two) times daily.  . magnesium oxide (MAG-OX) 400 MG tablet TAKE 1 TABLET (400 MG TOTAL) BY MOUTH DAILY.  . Magnesium Oxide 400 MG CAPS Take 1 capsule (400 mg total) by mouth daily.  . metoprolol succinate (TOPROL-XL) 100 MG 24 hr tablet Take 100 mg by mouth in the morning and at bedtime. Take with or immediately following a meal.  . Multiple Vitamins-Minerals (MULTIVITAMIN WOMEN PO) Take 1 tablet by mouth daily.   . nitroGLYCERIN (NITROSTAT) 0.4 MG SL tablet Place 1 tablet (0.4 mg total) under the tongue every 5 (five) minutes x 3 doses as needed for chest pain.  Marland Kitchen Potassium Chloride ER 20 MEQ TBCR Take 20 mEq by mouth daily.  . potassium chloride SA (KLOR-CON) 20 MEQ tablet TAKE 1 TABLET (20 MEQ) BY MOUTH DAILY.  Marland Kitchen RESTASIS 0.05 % ophthalmic emulsion Place 1 drop into both eyes 2 (two) times daily.  . rosuvastatin (CRESTOR) 10 MG tablet Take 1 tablet (10 mg total) by mouth daily.  . TRUE METRIX BLOOD GLUCOSE TEST test strip USE AS INSTRUCTED TO CHECK BLOOD SUGAR DAILY.   No current facility-administered medications on file prior to visit.     Allergies:   Sulfa antibiotics and Other   Social History   Tobacco Use  . Smoking  status: Never Smoker  . Smokeless tobacco: Never Used  Vaping Use  . Vaping Use: Never used  Substance Use Topics  . Alcohol use: No  . Drug use: No    Family History: family history includes CAD in an other family member; Cancer in her father and mother; Hypertension in her brother, mother, and sister.  ROS:   Please see the history of present illness.  Additional pertinent ROS otherwise unremarkable.  EKGs/Labs/Other Studies Reviewed:    The following studies were reviewed today: Echo 07/09/20 1. Left ventricular ejection fraction, by estimation, is 45 to 50%. The  left ventricle has mildly decreased function. The left ventricle  demonstrates global hypokinesis. Left ventricular diastolic function could  not be evaluated.  2. Right ventricular systolic function was not well visualized. The right  ventricular size is  moderately enlarged. There is normal pulmonary artery  systolic pressure.  3. Left atrial size was mildly dilated.  4. Right atrial size was moderately dilated.  5. Eccentric MR jet; appears mild-moderate on parasternal views, but on  apical views (see images 52, 66, 72) extends into distal atrium and may  have Coanda effect. Incompletely visualized and PV not sampled, suspect  moderate to severe MR.. The mitral  valve is rheumatic. Moderate to severe mitral valve regurgitation.  6. Tricuspid valve regurgitation is severe.  7. The aortic valve is tricuspid. Aortic valve regurgitation is not  visualized. No aortic stenosis is present.  8. The inferior vena cava is normal in size with <50% respiratory  variability, suggesting right atrial pressure of 8 mmHg.   Echo 07/02/2018 - Left ventricle: The cavity size was normal. Systolic function was  mildly reduced. The estimated ejection fraction was in the range  of 45% to 50%. There is moderate hypokinesis of the  entireanteroseptal myocardium.  - Aortic valve: There was mild regurgitation.  - Mitral  valve: Mildly dilated, calcified annulus. There was severe  regurgitation.  - Left atrium: The atrium was severely dilated.  - Right ventricle: The cavity size was mildly dilated. Wall  thickness was normal. Systolic function was mildly to moderately  reduced.  - Right atrium: The atrium was severely dilated.  - Tricuspid valve: There was severe regurgitation.  - Pulmonary arteries: Systolic pressure was mildly increased. PA  peak pressure: 31 mm Hg (S).   Cath 12/31/2017  Prox LAD lesion is 10% stenosed.  Dist LM to Ost LAD lesion is 10% stenosed.  Ost Cx lesion is 15% stenosed.  The left ventricular systolic function is normal.  LV end diastolic pressure is normal.   EKG:  EKG is personally reviewed.  The ekg ordered 09/08/20 demonstrates atrial fibrillation, rate 108 bpm  Recent Labs: 12/23/2019: TSH 2.820 03/02/2020: B Natriuretic Peptide 43.7 04/21/2020: ALT 17 05/03/2020: Platelets 236 05/06/2020: Hemoglobin 15.3 11/05/2020: BUN 21; Creatinine, Ser 1.62; Magnesium 2.4; Potassium 4.1; Sodium 137  Recent Lipid Panel    Component Value Date/Time   CHOL 140 06/24/2020 1054   TRIG 102 06/24/2020 1054   HDL 46 06/24/2020 1054   CHOLHDL 3.0 06/24/2020 1054   CHOLHDL 3.1 12/30/2017 0436   VLDL 20 12/30/2017 0436   LDLCALC 75 06/24/2020 1054    Physical Exam:    VS:  There were no vitals taken for this visit.    Wt Readings from Last 3 Encounters:  11/05/20 277 lb 1.9 oz (125.7 kg)  11/02/20 (!) 301 lb 9.6 oz (136.8 kg)  09/27/20 272 lb 12.8 oz (123.7 kg)    Speaking comfortably on the phone, no audible wheezing In no acute distress Alert and oriented Normal affect Normal speech  ASSESSMENT:    No diagnosis found. PLAN:    Persistent atrial fibrillation: -she would like admission for tikosyn load, but elective admissions are currently limited due to the Covid pandemic -need to make sure renal function doesn't worsen to the point that she can no longer  receive tikosyn -CHA2DS2/VAS Stroke Risk Points=4, on dabigatran, no missed doses  Nonischemic cardiomyopathy, with acute on chronic combined systolic and diastolic heart failure: last EF 45-50% -see prior med management -on digoxin, dulaglutide, furosemide, metoprolol succinate -stopped prior metolazone, still with good urine output and stable weights, slightly improved Cr -No room to add ACEI/ARB/ARNI/MRA currently due to renal function, last Cr 1.84  History of obstructive sleep apnea, with CPAP at home: -  recent sleep study was not diagnostic for sleep apanes  Severe MR, severe TR: -seen on prior echo -MR likely exacerbating atrial stretch/afib -we discussed timing of events. She would like to try to get into sinus rhythm and then do TEE to further evaluate MR/TR -if we cannot get her into sinus, could do TEE and then potential have MAZE/clip if mitral surgery needed  Type II diabetes: -A1c 9.9 09/24/20 -did not tolerate metformin -on insulin -also on dulaglutide (GLP1RA) -her GFR is <30, so SGLT2i is not recommended -on rosuvastatin 10 mg daily for diabetic dyslipidemia -per [facility], lipids 06/2020 show Tchol 140, HDL 46, LDL 75, TG 102 -would not add aspirin as she is on dabigatran  Strong family history of heart disease/heart failure: she is unclear of the etiology of this  Cardiac risk counseling and prevention recommendations: -recommend heart healthy/Mediterranean diet, with whole grains, fruits, vegetable, fish, lean meats, nuts, and olive oil. Limit salt. -recommend moderate walking, 3-5 times/week for 30-50 minutes each session. Aim for at least 150 minutes.week. Goal should be pace of 3 miles/hours, or walking 1.5 miles in 30 minutes -recommend avoidance of tobacco products. Avoid excess alcohol. -Additional risk factor control:  -Weight: BMI 42. Would benefit from weight loss given morbid obesity with comorbidities -ASCVD risk score: The 10-year ASCVD risk score Mikey Bussing DC  Brooke Bonito., et al., 2013) is: 21.1%   Values used to calculate the score:     Age: 15 years     Sex: Female     Is Non-Hispanic African American: Yes     Diabetic: Yes     Tobacco smoker: No     Systolic Blood Pressure: 458 mmHg     Is BP treated: Yes     HDL Cholesterol: 46 mg/dL     Total Cholesterol: 140 mg/dL    Plan for follow up: 2 mos or sooner as needed  Today, I have spent 11 minutes with the patient with telehealth technology discussing the above problems.  Additional time spent in chart review, documentation, and communication.  Buford Dresser, MD, PhD [place]  [facility]    Medication Adjustments/Labs and Tests Ordered: Current medicines are reviewed at length with the patient today.  Concerns regarding medicines are outlined above.  No orders of the defined types were placed in this encounter.  No orders of the defined types were placed in this encounter.   There are no Patient Instructions on file for this visit.  Signed, Buford Dresser, MD PhD 11/22/2020    [facility]

## 2020-11-22 NOTE — Telephone Encounter (Signed)
Pt c/o medication issue:  1. Name of Medication:  torsemide 40 MG TABS apixaban (ELIQUIS) 5 MG TABS tablet  2. How are you currently taking this medication (dosage and times per day)? New medication  3. Are you having a reaction (difficulty breathing--STAT)? no  4. What is your medication issue? Patient states her prescriptions are new and were not received by the pharmacy.

## 2020-11-22 NOTE — H&P (View-Only) (Signed)
Cardiology Office Note:    Date:  11/22/2020   ID:  [name], DOB 11/27/1955, MRN [id number]  PCP:  [name], NP  Cardiologist:  Buford Dresser, MD  Referring MD: [name], NP   CC: follow up  History of Present Illness:    [name] is a 65 y.o. female with a hx of atrial fibrillation, chronic systolic and diastolic heart failure, type II diabetes who is seen for follow up today. I initially saw her 03/31/20 as a new consult at the request of [name], NP for the evaluation and management of atrial fibrillation, heart failure.  Today: Admitted for tikosyn load, had return of atrial fibrillation after cardioversion, changed to amiodarone. Has been having issues with swelling and shortness of breath. She states having some weight changes since her last visit 270 to 307 lbs. She feels like she has been holding weight in her chest and legs mostly. She has shortness of breath after waking up at night. Dr. Debara Pickett recommended taking BID dosing of her diuretic until symptoms improved (call date 11/08/20). Also changed from pradaxa to apixaban as insurance did not cover pradaxa. She is considering to get TEE when in sinus rhythm to accurately evaluate MR jet due to her mitral regurgitation. She has food entrapment in her throat at times and it is becoming more frequent. She usually has to clear her throat or drink water to clear any food particles . She has never completed any throat radiation in the past.   Wt Readings from Last 3 Encounters:  11/22/20 (!) 307 lb (139.3 kg)  11/05/20 277 lb 1.9 oz (125.7 kg)  11/02/20 (!) 301 lb 9.6 oz (136.8 kg)   She reports having constant sharp stabbing pressure and pain on the left side of her chest that worsened yesterday. She feels like an elephant is sitting on her chest. She has some dizziness with these symptoms, she often feels off balance at times.   We discussed her mitral regurgitation today.  Had hoped to get TEE when in sinus rhythm to accurately evaluate MR jet. She may need MVR/MAZE/LAA clip if MR is severe.   Past Medical History:  Diagnosis Date  . Allergic rhinitis   . Arthritis   . Asthma   . Chest pain 12/27/2017  . Chronic diastolic CHF (congestive heart failure) ([place])   . COPD (chronic obstructive pulmonary disease) ([place])   . Depression   . DM (diabetes mellitus) ([place])   . DVT (deep vein thrombosis) in pregnancy   . Dysrhythmia    atrial fibrilation  . GERD (gastroesophageal reflux disease)   . Headache(784.0)   . HTN (hypertension)   . Hyperlipidemia   . Obesity   . Pneumonia 04/2018   RIGHT LOBE  . Shortness of breath   . Sleep apnea    compliant with CPAP    Past Surgical History:  Procedure Laterality Date  . ABDOMINAL HYSTERECTOMY    . CARDIOVERSION N/A 05/06/2020   Procedure: CARDIOVERSION;  Surgeon: Buford Dresser, MD;  Location[facility] ENDOSCOPY;  Service: Cardiovascular;  Laterality: N/A;  . CARDIOVERSION N/A 11/04/2020   Procedure: CARDIOVERSION;  Surgeon: Lelon Perla, MD;  [facility] END[facility]COPY;  Service: Cardiovascular;  Laterality: N/A;  . CATARACT EXTRACTION, BILATERAL    . CHOLECYSTECTOMY    . COLONOSCOPY WITH PROPOFOL N/A 03/05/2015   Procedure: COLONOSCOPY WITH PROPOFOL;  Surgeon: Carol Ada, MD;  Location: [facility] ENDOSCOPY;  Service: Endoscopy;  Laterality: N/A;  . DIAGNOSTIC LAPAROSCOPY    .  ingrown hallux Left   . KNEE SURGERY    . LEFT HEART CATH AND CORONARY ANGIOGRAPHY N/A 12/31/2017   Procedure: LEFT HEART CATH AND CORONARY ANGIOGRAPHY;  Surgeon: Charolette Forward, MD;  Location: [facility] CV LAB;  Service: Cardiovascular;  Laterality: N/A;    Current Medications: Current Outpatient Medications on File Prior to Visit  Medication Sig  . Accu-Chek Softclix Lancets lancets Use as instructed to check blood sugar three times daily. E11.69  . albuterol (VENTOLIN HFA) 108 (90 Base) MCG/ACT inhaler Inhale 2 puffs into the  lungs every 4 (four) hours as needed for wheezing or shortness of breath.   . Alcohol Swabs (B-D SINGLE USE SWABS REGULAR) PADS   . amiodarone (PACERONE) 200 MG tablet Take 1 tablet (200 mg total) by mouth 2 (two) times daily.  Marland Kitchen amiodarone (PACERONE) 200 MG tablet TAKE 1 TABLET (200 MG TOTAL) BY MOUTH TWO TIMES DAILY.  Marland Kitchen apixaban (ELIQUIS) 5 MG TABS tablet Take 1 tablet (5 mg total) by mouth 2 (two) times daily.  Marland Kitchen aspirin-sod bicarb-citric acid (ALKA-SELTZER) 325 MG TBEF tablet Take 325 mg by mouth every 6 (six) hours as needed (indigestion).  . Blood Glucose Monitoring Suppl (ACCU-CHEK GUIDE ME) w/Device KIT Use as instructed to check blood sugar three times daily. E11.69  . Blood Glucose Monitoring Suppl (ACCU-CHEK GUIDE) w/Device KIT USE AS INSTRUCTED TO CHECK BLOOD SUGAR THREE TIMES DAILY. E11.69  . digoxin (LANOXIN) 0.125 MG tablet Take 0.125 mg by mouth daily.   . Dulaglutide (TRULICITY) 1.5 OE/3.2ZY SOPN Inject 1.5 mg into the skin once a week.  . DULoxetine (CYMBALTA) 60 MG capsule Take 1 capsule (60 mg total) by mouth daily.  Marland Kitchen EPINEPHrine 0.3 mg/0.3 mL IJ SOAJ injection Inject 0.3 mg into the muscle as needed for anaphylaxis.  . furosemide (LASIX) 40 MG tablet Take 1 tablet (40 mg total) by mouth 2 (two) times daily.  Marland Kitchen gabapentin (NEURONTIN) 300 MG capsule Take 2 capsules (600 mg total) by mouth 2 (two) times daily.  . insulin aspart (NOVOLOG) 100 UNIT/ML injection Inject 12 Units into the skin 3 (three) times daily before meals. For blood sugars 0-199 give 0 units of insulin, 201-250 give 4 units, 251-300 give 6 units, 301-350 give 8 units, 351-400 give 10 units,> 400 give 12 units and call M.D.  . LEVEMIR 100 UNIT/ML injection Inject 50 Units into the skin 2 (two) times daily.  . magnesium oxide (MAG-OX) 400 MG tablet TAKE 1 TABLET (400 MG TOTAL) BY MOUTH DAILY.  . Magnesium Oxide 400 MG CAPS Take 1 capsule (400 mg total) by mouth daily.  . metoprolol succinate (TOPROL-XL) 100 MG 24  hr tablet Take 100 mg by mouth in the morning and at bedtime. Take with or immediately following a meal.  . Multiple Vitamins-Minerals (MULTIVITAMIN WOMEN PO) Take 1 tablet by mouth daily.   . nitroGLYCERIN (NITROSTAT) 0.4 MG SL tablet Place 1 tablet (0.4 mg total) under the tongue every 5 (five) minutes x 3 doses as needed for chest pain.  Marland Kitchen Potassium Chloride ER 20 MEQ TBCR Take 20 mEq by mouth daily.  . potassium chloride SA (KLOR-CON) 20 MEQ tablet TAKE 1 TABLET (20 MEQ) BY MOUTH DAILY.  Marland Kitchen RESTASIS 0.05 % ophthalmic emulsion Place 1 drop into both eyes 2 (two) times daily.  . rosuvastatin (CRESTOR) 10 MG tablet Take 1 tablet (10 mg total) by mouth daily.  . TRUE METRIX BLOOD GLUCOSE TEST test strip USE AS INSTRUCTED TO CHECK BLOOD SUGAR DAILY.  No current facility-administered medications on file prior to visit.     Allergies:   Sulfa antibiotics and Other   Social History   Tobacco Use  . Smoking status: Never Smoker  . Smokeless tobacco: Never Used  Vaping Use  . Vaping Use: Never used  Substance Use Topics  . Alcohol use: No  . Drug use: No    Family History: family history includes CAD in an other family member; Cancer in her father and mother; Hypertension in her brother, mother, and sister.  ROS:   Please see the history of present illness.  Additional pertinent ROS otherwise unremarkable.  EKGs/Labs/Other Studies Reviewed:    The following studies were reviewed today: Echo 07/09/20 1. Left ventricular ejection fraction, by estimation, is 45 to 50%. The  left ventricle has mildly decreased function. The left ventricle  demonstrates global hypokinesis. Left ventricular diastolic function could  not be evaluated.  2. Right ventricular systolic function was not well visualized. The right  ventricular size is moderately enlarged. There is normal pulmonary artery  systolic pressure.  3. Left atrial size was mildly dilated.  4. Right atrial size was moderately  dilated.  5. Eccentric MR jet; appears mild-moderate on parasternal views, but on  apical views (see images 52, 66, 72) extends into distal atrium and may  have Coanda effect. Incompletely visualized and PV not sampled, suspect  moderate to severe MR.. The mitral  valve is rheumatic. Moderate to severe mitral valve regurgitation.  6. Tricuspid valve regurgitation is severe.  7. The aortic valve is tricuspid. Aortic valve regurgitation is not  visualized. No aortic stenosis is present.  8. The inferior vena cava is normal in size with <50% respiratory  variability, suggesting right atrial pressure of 8 mmHg.   Echo 07/02/2018 - Left ventricle: The cavity size was normal. Systolic function was  mildly reduced. The estimated ejection fraction was in the range  of 45% to 50%. There is moderate hypokinesis of the  entireanteroseptal myocardium.  - Aortic valve: There was mild regurgitation.  - Mitral valve: Mildly dilated, calcified annulus. There was severe  regurgitation.  - Left atrium: The atrium was severely dilated.  - Right ventricle: The cavity size was mildly dilated. Wall  thickness was normal. Systolic function was mildly to moderately  reduced.  - Right atrium: The atrium was severely dilated.  - Tricuspid valve: There was severe regurgitation.  - Pulmonary arteries: Systolic pressure was mildly increased. PA  peak pressure: 31 mm Hg (S).   Cath 12/31/2017  Prox LAD lesion is 10% stenosed.  Dist LM to Ost LAD lesion is 10% stenosed.  Ost Cx lesion is 15% stenosed.  The left ventricular systolic function is normal.  LV end diastolic pressure is normal.  EKG:  Personally reviewed 4/22- Atrial fibrillation, rate 82 bpm with PVCs 09/08/20- Atrial fibrillation, rate 108 bpm  Recent Labs: 12/23/2019: TSH 2.820 03/02/2020: B Natriuretic Peptide 43.7 04/21/2020: ALT 17 05/03/2020: Platelets 236 05/06/2020: Hemoglobin 15.3 11/05/2020: BUN 21; Creatinine, Ser  1.62; Magnesium 2.4; Potassium 4.1; Sodium 137  Recent Lipid Panel    Component Value Date/Time   CHOL 140 06/24/2020 1054   TRIG 102 06/24/2020 1054   HDL 46 06/24/2020 1054   CHOLHDL 3.0 06/24/2020 1054   CHOLHDL 3.1 12/30/2017 0436   VLDL 20 12/30/2017 0436   LDLCALC 75 06/24/2020 1054    Physical Exam:    VS:  BP 124/76   Pulse 80   Ht _0  (1.702 m)  Wt (!) 307 lb (139.3 kg)   SpO2 95%   BMI 48.08 kg/m     Wt Readings from Last 3 Encounters:  11/22/20 (!) 307 lb (139.3 kg)  11/05/20 277 lb 1.9 oz (125.7 kg)  11/02/20 (!) 301 lb 9.6 oz (136.8 kg)     GEN: Well nourished, well developed in no acute distress HEENT: Normal, moist mucous membranes NECK: Elevated JVD with +TR wave CARDIAC: irregularly irregular rhythm, normal S1 and S2, no rubs or gallops. 3/6 holosystolic murmur. VASCULAR: Radial and DP pulses 2+ bilaterally. No carotid bruits RESPIRATORY:  Clear to auscultation without rales, wheezing or rhonchi  ABDOMEN: Soft, non-tender, non-distended MUSCULOSKELETAL:  Ambulates independently SKIN: Warm and dry, + 1 bilateral LE edema NEUROLOGIC:  Alert and oriented x 3. No focal neuro deficits noted. PSYCHIATRIC:  Normal affect   ASSESSMENT:    1. Severe mitral regurgitation   2. Pre-procedure lab exam   3. Swelling   4. Chest pain, unspecified type   5. NICM (nonischemic cardiomyopathy) ([place])   6. Longstanding persistent atrial fibrillation ([place])   7. Secondary hypercoagulable state ([place])   8. DOE (dyspnea on exertion)   9. Chronic combined systolic and diastolic heart failure ([place])   10. Severe tricuspid regurgitation    PLAN:    Chest pressure LE edema -ECG low voltage, afib with PVCs, no acute ischemic changes -we did discuss ER/hospitalization today -after shared decision making, will change furosemide to torsemide as she feels swollen in her gut and is not making good urine on the furosemide -check BMET today -reviewed red flag warning signs,  fact that acute MI should be evaluated in the ER and not the office. Declines ER now but will go if symptoms worsen  Persistent atrial fibrillation: -admitted for tikosyn load, briefly sinus with cardioversion but did not hold. Now on amiodarone. In afib today -CHA2DS2/VAS Stroke Risk Points=4, transitioned from dabigatran to apixaban due to insurance  Moderate-severe MR, severe TR: -seen on prior echo -MR likely exacerbating atrial stretch/afib -we discussed TEE and workup extensively today. Discussed TEE soon, then [facility] if needed prior to referral to surgical team  Shared Decision Making/Informed Consent The risks [esophageal damage, perforation (1:10,000 risk), bleeding, pharyngeal hematoma as well as other potential complications associated with conscious sedation including aspiration, arrhythmia, respiratory failure and death], benefits (treatment guidance and diagnostic support) and alternatives of a transesophageal echocardiogram were discussed in detail with [name] and she is willing to proceed.   Shared Decision Making/Informed Consent The risks [stroke (1 in 1000), death (1 in 1000), kidney failure [usually temporary] (1 in 500), bleeding (1 in 200), allergic reaction [possibly serious] (1 in 200)], benefits (diagnostic support and management of coronary artery disease) and alternatives of a cardiac catheterization were discussed in detail with [name] and she is willing to proceed.  -will plan for TEE this week, then based on results will discuss timing/need for cath. Last cath 12/2017 with minimal nonobstructive CAD  Nonischemic cardiomyopathy, with acute on chronic combined systolic and diastolic heart failure: last EF 45-50% -see prior med management -on digoxin, dulaglutide, metoprolol succinate -changing furosemide to torsemide as above -monitor Cr -No room to add ACEI/ARB/ARNI/MRA currently due to renal function  History of obstructive sleep apnea, with  CPAP at home: -recent sleep study was not diagnostic for sleep apanes  Type II diabetes: -A1c 9.3 11/02/20 -did not tolerate metformin -on insulin -also on dulaglutide (GLP1RA) -her GFR is <30, so SGLT2i is not recommended -on rosuvastatin 10  mg daily for diabetic dyslipidemia -per [facility], lipids 06/2020 show Tchol 140, HDL 46, LDL 75, TG 102 -would not add aspirin as she is on dabigatran  Strong family history of heart disease/heart failure: she is unclear of the etiology of this  Cardiac risk counseling and prevention recommendations: -recommend heart healthy/Mediterranean diet, with whole grains, fruits, vegetable, fish, lean meats, nuts, and olive oil. Limit salt. -recommend moderate walking, 3-5 times/week for 30-50 minutes each session. Aim for at least 150 minutes.week. Goal should be pace of 3 miles/hours, or walking 1.5 miles in 30 minutes -recommend avoidance of tobacco products. Avoid excess alcohol. -Additional risk factor control:  -Weight: BMI 42. Would benefit from weight loss given morbid obesity with comorbidities -ASCVD risk score: The 10-year ASCVD risk score Mikey Bussing DC Brooke Bonito., et al., 2013) is: 14%   Values used to calculate the score:     Age: 41 years     Sex: Female     Is Non-Hispanic African American: Yes     Diabetic: Yes     Tobacco smoker: No     Systolic Blood Pressure: 469 mmHg     Is BP treated: Yes     HDL Cholesterol: 46 mg/dL     Total Cholesterol: 140 mg/dL    Plan for follow up: To be determined based on results of testing. I will see her at her TEE 4/8  High complexity care, discussed ER/hospital today. Will expedite testing.  Buford Dresser, MD, PhD   [facility]    Medication Adjustments/Labs and Tests Ordered: Current medicines are reviewed at length with the patient today.  Concerns regarding medicines are outlined above.  Orders Placed This Encounter  Procedures  . Basic metabolic panel  . CBC  . Brain natriuretic  peptide   Meds ordered this encounter  Medications  . torsemide 40 MG TABS    Sig: Take 40 mg by mouth 2 (two) times daily.    Dispense:  60 tablet    Refill:  11  . aspirin chewable tablet 324 mg  . nitroGLYCERIN (NITROSTAT) SL tablet 0.4 mg    Patient Instructions  Medication Instructions:  Stop Lasix 40 mg  Start Torsemide 40 mg twice a day   *If you need a refill on your cardiac medications before your next appointment, please call your pharmacy*   Lab Work: Your physician recommends lab work today (CBC, BMP, BNP).  If you have labs (blood work) drawn today and your tests are completely normal, you will receive your results only by: Marland Kitchen MyChart Message (if you have MyChart) OR . A paper copy in the mail If you have any lab test that is abnormal or we need to change your treatment, we will call you to review the results.   Testing/Procedures: Your physician has requested that you have a TEE. During a TEE, sound waves are used to create images of your heart. It provides your doctor with information about the size and shape of your heart and how well your heart's chambers and valves are working. In this test, a transducer is attached to the end of a flexible tube that's guided down your throat and into your esophagus (the tube leading from you mouth to your stomach) to get a more detailed image of your heart. You are not awake for the procedure. Please see the instruction sheet given to you today. For further information please visit [email or web address]     Follow-Up: At [facility], you and your  health needs are our priority.  As part of our continuing mission to provide you with exceptional heart care, we have created designated Provider Care Teams.  These Care Teams include your primary Cardiologist (physician) and Advanced Practice Providers (APPs -  Physician Assistants and Nurse Practitioners) who all work together to provide you with the care you  need, when you need it.  We recommend signing up for the patient portal called "MyChart".  Sign up information is provided on this After Visit Summary.  MyChart is used to connect with patients for Virtual Visits (Telemedicine).  Patients are able to view lab/test results, encounter notes, upcoming appointments, etc.  Non-urgent messages can be sent to your provider as well.   To learn more about what you can do with MyChart, go to [email or web address].    Your next appointment:   Based on test result  The format for your next appointment:   In Person  Provider:   Buford Dresser, MD    You are scheduled for a TEE on Friday 11/26/20 with Dr. Harrell Gave.  Please arrive at the [facility] (Main Entrance A) at [facility]: [address] [place], [place] [address] at 7 am  DIET: Nothing to eat or drink after midnight except a sip of water with medications (see medication instructions below)  Medication Instructions: Hold Levemir the night before and morning of procedure Hold Novolog the morning of procedure    Continue your anticoagulant: Eliquis You will need to continue your anticoagulant after your procedure until you are told by your  Provider that it is safe to stop   Labs:  Today (CBC, BMP, BNP)   You must have a responsible person to drive you home and stay in the waiting area during your procedure. Failure to do so could result in cancellation.  Bring your insurance cards.  *Special Note: Every effort is made to have your procedure done on time. Occasionally there are emergencies that occur at the hospital that may cause delays. Please be patient if a delay does occur.   You will need to have the coronavirus test completed prior to your procedure. An appointment has been made at 10:15 am on Tuesday 11/23/20. This is a Drive Up Visit at [address], [place], [place] [address]. Please tell them that you are there for procedure testing. Stay in your car  and someone will be with you shortly. Please make sure to have all other labs completed before this test because you will need to stay quarantined until your procedure.        I,Alexis Bryant,acting as a Education administrator for [employer], MD.,have documented all relevant documentation on the behalf of Buford Dresser, MD,as directed by  Buford Dresser, MD while in the presence of Buford Dresser, MD.  Signed, Buford Dresser, MD PhD 11/22/2020    [place]

## 2020-11-22 NOTE — Patient Instructions (Signed)
Medication Instructions:  Stop Lasix 40 mg  Start Torsemide 40 mg twice a day   *If you need a refill on your cardiac medications before your next appointment, please call your pharmacy*   Lab Work: Your physician recommends lab work today (CBC, BMP, BNP).  If you have labs (blood work) drawn today and your tests are completely normal, you will receive your results only by: Marland Kitchen MyChart Message (if you have MyChart) OR . A paper copy in the mail If you have any lab test that is abnormal or we need to change your treatment, we will call you to review the results.   Testing/Procedures: Your physician has requested that you have a TEE. During a TEE, sound waves are used to create images of your heart. It provides your doctor with information about the size and shape of your heart and how well your heart's chambers and valves are working. In this test, a transducer is attached to the end of a flexible tube that's guided down your throat and into your esophagus (the tube leading from you mouth to your stomach) to get a more detailed image of your heart. You are not awake for the procedure. Please see the instruction sheet given to you today. For further information please visit [email or web address]     Follow-Up: At [facility], you and your health needs are our priority.  As part of our continuing mission to provide you with exceptional heart care, we have created designated Provider Care Teams.  These Care Teams include your primary Cardiologist (physician) and Advanced Practice Providers (APPs -  Physician Assistants and Nurse Practitioners) who all work together to provide you with the care you need, when you need it.  We recommend signing up for the patient portal called "MyChart".  Sign up information is provided on this After Visit Summary.  MyChart is used to connect with patients for Virtual Visits (Telemedicine).  Patients are able to view lab/test results,  encounter notes, upcoming appointments, etc.  Non-urgent messages can be sent to your provider as well.   To learn more about what you can do with MyChart, go to [email or web address].    Your next appointment:   Based on test result  The format for your next appointment:   In Person  Provider:   Buford Dresser, MD    You are scheduled for a TEE on Friday 11/26/20 with Dr. Harrell Gave.  Please arrive at the [facility] (Main Entrance A) at [facility]: [address] [place], [place] [address] at 7 am  DIET: Nothing to eat or drink after midnight except a sip of water with medications (see medication instructions below)  Medication Instructions: Hold Levemir the night before and morning of procedure Hold Novolog the morning of procedure    Continue your anticoagulant: Eliquis You will need to continue your anticoagulant after your procedure until you are told by your  Provider that it is safe to stop   Labs:  Today (CBC, BMP, BNP)   You must have a responsible person to drive you home and stay in the waiting area during your procedure. Failure to do so could result in cancellation.  Bring your insurance cards.  *Special Note: Every effort is made to have your procedure done on time. Occasionally there are emergencies that occur at the hospital that may cause delays. Please be patient if a delay does occur.   You will need to have the coronavirus test completed prior to your  procedure. An appointment has been made at 10:15 am on Tuesday 11/23/20. This is a Drive Up Visit at [address], [place], [place] [address]. Please tell them that you are there for procedure testing. Stay in your car and someone will be with you shortly. Please make sure to have all other labs completed before this test because you will need to stay quarantined until your procedure.

## 2020-11-22 NOTE — Progress Notes (Signed)
Cardiology Office Note:    Date:  11/22/2020   ID:  [name], DOB 11/27/1955, MRN [id number]  PCP:  [name], NP  Cardiologist:  Buford Dresser, MD  Referring MD: [name], NP   CC: follow up  History of Present Illness:    [name] is a 65 y.o. female with a hx of atrial fibrillation, chronic systolic and diastolic heart failure, type II diabetes who is seen for follow up today. I initially saw her 03/31/20 as a new consult at the request of [name], NP for the evaluation and management of atrial fibrillation, heart failure.  Today: Admitted for tikosyn load, had return of atrial fibrillation after cardioversion, changed to amiodarone. Has been having issues with swelling and shortness of breath. She states having some weight changes since her last visit 270 to 307 lbs. She feels like she has been holding weight in her chest and legs mostly. She has shortness of breath after waking up at night. Dr. Debara Pickett recommended taking BID dosing of her diuretic until symptoms improved (call date 11/08/20). Also changed from pradaxa to apixaban as insurance did not cover pradaxa. She is considering to get TEE when in sinus rhythm to accurately evaluate MR jet due to her mitral regurgitation. She has food entrapment in her throat at times and it is becoming more frequent. She usually has to clear her throat or drink water to clear any food particles . She has never completed any throat radiation in the past.   Wt Readings from Last 3 Encounters:  11/22/20 (!) 307 lb (139.3 kg)  11/05/20 277 lb 1.9 oz (125.7 kg)  11/02/20 (!) 301 lb 9.6 oz (136.8 kg)   She reports having constant sharp stabbing pressure and pain on the left side of her chest that worsened yesterday. She feels like an elephant is sitting on her chest. She has some dizziness with these symptoms, she often feels off balance at times.   We discussed her mitral regurgitation today.  Had hoped to get TEE when in sinus rhythm to accurately evaluate MR jet. She may need MVR/MAZE/LAA clip if MR is severe.   Past Medical History:  Diagnosis Date  . Allergic rhinitis   . Arthritis   . Asthma   . Chest pain 12/27/2017  . Chronic diastolic CHF (congestive heart failure) ([place])   . COPD (chronic obstructive pulmonary disease) ([place])   . Depression   . DM (diabetes mellitus) ([place])   . DVT (deep vein thrombosis) in pregnancy   . Dysrhythmia    atrial fibrilation  . GERD (gastroesophageal reflux disease)   . Headache(784.0)   . HTN (hypertension)   . Hyperlipidemia   . Obesity   . Pneumonia 04/2018   RIGHT LOBE  . Shortness of breath   . Sleep apnea    compliant with CPAP    Past Surgical History:  Procedure Laterality Date  . ABDOMINAL HYSTERECTOMY    . CARDIOVERSION N/A 05/06/2020   Procedure: CARDIOVERSION;  Surgeon: Buford Dresser, MD;  Location[facility] ENDOSCOPY;  Service: Cardiovascular;  Laterality: N/A;  . CARDIOVERSION N/A 11/04/2020   Procedure: CARDIOVERSION;  Surgeon: Lelon Perla, MD;  [facility] ENDOSCOPY;  Service: Cardiovascular;  Laterality: N/A;  . CATARACT EXTRACTION, BILATERAL    . CHOLECYSTECTOMY    . COLONOSCOPY WITH PROPOFOL N/A 03/05/2015   Procedure: COLONOSCOPY WITH PROPOFOL;  Surgeon: Carol Ada, MD;  Location: [facility] ENDOSCOPY;  Service: Endoscopy;  Laterality: N/A;  . DIAGNOSTIC LAPAROSCOPY    .  ingrown hallux Left   . KNEE SURGERY    . LEFT HEART CATH AND CORONARY ANGIOGRAPHY N/A 12/31/2017   Procedure: LEFT HEART CATH AND CORONARY ANGIOGRAPHY;  Surgeon: Charolette Forward, MD;  Location: [facility] CV LAB;  Service: Cardiovascular;  Laterality: N/A;    Current Medications: Current Outpatient Medications on File Prior to Visit  Medication Sig  . Accu-Chek Softclix Lancets lancets Use as instructed to check blood sugar three times daily. E11.69  . albuterol (VENTOLIN HFA) 108 (90 Base) MCG/ACT inhaler Inhale 2 puffs into the  lungs every 4 (four) hours as needed for wheezing or shortness of breath.   . Alcohol Swabs (B-D SINGLE USE SWABS REGULAR) PADS   . amiodarone (PACERONE) 200 MG tablet Take 1 tablet (200 mg total) by mouth 2 (two) times daily.  Marland Kitchen amiodarone (PACERONE) 200 MG tablet TAKE 1 TABLET (200 MG TOTAL) BY MOUTH TWO TIMES DAILY.  Marland Kitchen apixaban (ELIQUIS) 5 MG TABS tablet Take 1 tablet (5 mg total) by mouth 2 (two) times daily.  Marland Kitchen aspirin-sod bicarb-citric acid (ALKA-SELTZER) 325 MG TBEF tablet Take 325 mg by mouth every 6 (six) hours as needed (indigestion).  . Blood Glucose Monitoring Suppl (ACCU-CHEK GUIDE ME) w/Device KIT Use as instructed to check blood sugar three times daily. E11.69  . Blood Glucose Monitoring Suppl (ACCU-CHEK GUIDE) w/Device KIT USE AS INSTRUCTED TO CHECK BLOOD SUGAR THREE TIMES DAILY. E11.69  . digoxin (LANOXIN) 0.125 MG tablet Take 0.125 mg by mouth daily.   . Dulaglutide (TRULICITY) 1.5 OE/3.2ZY SOPN Inject 1.5 mg into the skin once a week.  . DULoxetine (CYMBALTA) 60 MG capsule Take 1 capsule (60 mg total) by mouth daily.  Marland Kitchen EPINEPHrine 0.3 mg/0.3 mL IJ SOAJ injection Inject 0.3 mg into the muscle as needed for anaphylaxis.  . furosemide (LASIX) 40 MG tablet Take 1 tablet (40 mg total) by mouth 2 (two) times daily.  Marland Kitchen gabapentin (NEURONTIN) 300 MG capsule Take 2 capsules (600 mg total) by mouth 2 (two) times daily.  . insulin aspart (NOVOLOG) 100 UNIT/ML injection Inject 12 Units into the skin 3 (three) times daily before meals. For blood sugars 0-199 give 0 units of insulin, 201-250 give 4 units, 251-300 give 6 units, 301-350 give 8 units, 351-400 give 10 units,> 400 give 12 units and call M.D.  . LEVEMIR 100 UNIT/ML injection Inject 50 Units into the skin 2 (two) times daily.  . magnesium oxide (MAG-OX) 400 MG tablet TAKE 1 TABLET (400 MG TOTAL) BY MOUTH DAILY.  . Magnesium Oxide 400 MG CAPS Take 1 capsule (400 mg total) by mouth daily.  . metoprolol succinate (TOPROL-XL) 100 MG 24  hr tablet Take 100 mg by mouth in the morning and at bedtime. Take with or immediately following a meal.  . Multiple Vitamins-Minerals (MULTIVITAMIN WOMEN PO) Take 1 tablet by mouth daily.   . nitroGLYCERIN (NITROSTAT) 0.4 MG SL tablet Place 1 tablet (0.4 mg total) under the tongue every 5 (five) minutes x 3 doses as needed for chest pain.  Marland Kitchen Potassium Chloride ER 20 MEQ TBCR Take 20 mEq by mouth daily.  . potassium chloride SA (KLOR-CON) 20 MEQ tablet TAKE 1 TABLET (20 MEQ) BY MOUTH DAILY.  Marland Kitchen RESTASIS 0.05 % ophthalmic emulsion Place 1 drop into both eyes 2 (two) times daily.  . rosuvastatin (CRESTOR) 10 MG tablet Take 1 tablet (10 mg total) by mouth daily.  . TRUE METRIX BLOOD GLUCOSE TEST test strip USE AS INSTRUCTED TO CHECK BLOOD SUGAR DAILY.  No current facility-administered medications on file prior to visit.     Allergies:   Sulfa antibiotics and Other   Social History   Tobacco Use  . Smoking status: Never Smoker  . Smokeless tobacco: Never Used  Vaping Use  . Vaping Use: Never used  Substance Use Topics  . Alcohol use: No  . Drug use: No    Family History: family history includes CAD in an other family member; Cancer in her father and mother; Hypertension in her brother, mother, and sister.  ROS:   Please see the history of present illness.  Additional pertinent ROS otherwise unremarkable.  EKGs/Labs/Other Studies Reviewed:    The following studies were reviewed today: Echo 07/09/20 1. Left ventricular ejection fraction, by estimation, is 45 to 50%. The  left ventricle has mildly decreased function. The left ventricle  demonstrates global hypokinesis. Left ventricular diastolic function could  not be evaluated.  2. Right ventricular systolic function was not well visualized. The right  ventricular size is moderately enlarged. There is normal pulmonary artery  systolic pressure.  3. Left atrial size was mildly dilated.  4. Right atrial size was moderately  dilated.  5. Eccentric MR jet; appears mild-moderate on parasternal views, but on  apical views (see images 52, 66, 72) extends into distal atrium and may  have Coanda effect. Incompletely visualized and PV not sampled, suspect  moderate to severe MR.. The mitral  valve is rheumatic. Moderate to severe mitral valve regurgitation.  6. Tricuspid valve regurgitation is severe.  7. The aortic valve is tricuspid. Aortic valve regurgitation is not  visualized. No aortic stenosis is present.  8. The inferior vena cava is normal in size with <50% respiratory  variability, suggesting right atrial pressure of 8 mmHg.   Echo 07/02/2018 - Left ventricle: The cavity size was normal. Systolic function was  mildly reduced. The estimated ejection fraction was in the range  of 45% to 50%. There is moderate hypokinesis of the  entireanteroseptal myocardium.  - Aortic valve: There was mild regurgitation.  - Mitral valve: Mildly dilated, calcified annulus. There was severe  regurgitation.  - Left atrium: The atrium was severely dilated.  - Right ventricle: The cavity size was mildly dilated. Wall  thickness was normal. Systolic function was mildly to moderately  reduced.  - Right atrium: The atrium was severely dilated.  - Tricuspid valve: There was severe regurgitation.  - Pulmonary arteries: Systolic pressure was mildly increased. PA  peak pressure: 31 mm Hg (S).   Cath 12/31/2017  Prox LAD lesion is 10% stenosed.  Dist LM to Ost LAD lesion is 10% stenosed.  Ost Cx lesion is 15% stenosed.  The left ventricular systolic function is normal.  LV end diastolic pressure is normal.  EKG:  Personally reviewed 4/22- Atrial fibrillation, rate 82 bpm with PVCs 09/08/20- Atrial fibrillation, rate 108 bpm  Recent Labs: 12/23/2019: TSH 2.820 03/02/2020: B Natriuretic Peptide 43.7 04/21/2020: ALT 17 05/03/2020: Platelets 236 05/06/2020: Hemoglobin 15.3 11/05/2020: BUN 21; Creatinine, Ser  1.62; Magnesium 2.4; Potassium 4.1; Sodium 137  Recent Lipid Panel    Component Value Date/Time   CHOL 140 06/24/2020 1054   TRIG 102 06/24/2020 1054   HDL 46 06/24/2020 1054   CHOLHDL 3.0 06/24/2020 1054   CHOLHDL 3.1 12/30/2017 0436   VLDL 20 12/30/2017 0436   LDLCALC 75 06/24/2020 1054    Physical Exam:    VS:  BP 124/76   Pulse 80   Ht _0  (1.702 m)  Wt (!) 307 lb (139.3 kg)   SpO2 95%   BMI 48.08 kg/m     Wt Readings from Last 3 Encounters:  11/22/20 (!) 307 lb (139.3 kg)  11/05/20 277 lb 1.9 oz (125.7 kg)  11/02/20 (!) 301 lb 9.6 oz (136.8 kg)     GEN: Well nourished, well developed in no acute distress HEENT: Normal, moist mucous membranes NECK: Elevated JVD with +TR wave CARDIAC: irregularly irregular rhythm, normal S1 and S2, no rubs or gallops. 3/6 holosystolic murmur. VASCULAR: Radial and DP pulses 2+ bilaterally. No carotid bruits RESPIRATORY:  Clear to auscultation without rales, wheezing or rhonchi  ABDOMEN: Soft, non-tender, non-distended MUSCULOSKELETAL:  Ambulates independently SKIN: Warm and dry, + 1 bilateral LE edema NEUROLOGIC:  Alert and oriented x 3. No focal neuro deficits noted. PSYCHIATRIC:  Normal affect   ASSESSMENT:    1. Severe mitral regurgitation   2. Pre-procedure lab exam   3. Swelling   4. Chest pain, unspecified type   5. NICM (nonischemic cardiomyopathy) ([place])   6. Longstanding persistent atrial fibrillation ([place])   7. Secondary hypercoagulable state ([place])   8. DOE (dyspnea on exertion)   9. Chronic combined systolic and diastolic heart failure ([place])   10. Severe tricuspid regurgitation    PLAN:    Chest pressure LE edema -ECG low voltage, afib with PVCs, no acute ischemic changes -we did discuss ER/hospitalization today -after shared decision making, will change furosemide to torsemide as she feels swollen in her gut and is not making good urine on the furosemide -check BMET today -reviewed red flag warning signs,  fact that acute MI should be evaluated in the ER and not the office. Declines ER now but will go if symptoms worsen  Persistent atrial fibrillation: -admitted for tikosyn load, briefly sinus with cardioversion but did not hold. Now on amiodarone. In afib today -CHA2DS2/VAS Stroke Risk Points=4, transitioned from dabigatran to apixaban due to insurance  Moderate-severe MR, severe TR: -seen on prior echo -MR likely exacerbating atrial stretch/afib -we discussed TEE and workup extensively today. Discussed TEE soon, then [facility] if needed prior to referral to surgical team  Shared Decision Making/Informed Consent The risks [esophageal damage, perforation (1:10,000 risk), bleeding, pharyngeal hematoma as well as other potential complications associated with conscious sedation including aspiration, arrhythmia, respiratory failure and death], benefits (treatment guidance and diagnostic support) and alternatives of a transesophageal echocardiogram were discussed in detail with Ms. Maynard-Narasimhan and she is willing to proceed.   Shared Decision Making/Informed Consent The risks [stroke (1 in 1000), death (1 in 1000), kidney failure [usually temporary] (1 in 500), bleeding (1 in 200), allergic reaction [possibly serious] (1 in 200)], benefits (diagnostic support and management of coronary artery disease) and alternatives of a cardiac catheterization were discussed in detail with Ms. Maynard-Stjames and she is willing to proceed.  -will plan for TEE this week, then based on results will discuss timing/need for cath. Last cath 12/2017 with minimal nonobstructive CAD  Nonischemic cardiomyopathy, with acute on chronic combined systolic and diastolic heart failure: last EF 45-50% -see prior med management -on digoxin, dulaglutide, metoprolol succinate -changing furosemide to torsemide as above -monitor Cr -No room to add ACEI/ARB/ARNI/MRA currently due to renal function  History of obstructive sleep apnea, with  CPAP at home: -recent sleep study was not diagnostic for sleep apanes  Type II diabetes: -A1c 9.3 11/02/20 -did not tolerate metformin -on insulin -also on dulaglutide (GLP1RA) -her GFR is <30, so SGLT2i is not recommended -on rosuvastatin 10  mg daily for diabetic dyslipidemia -per [facility], lipids 06/2020 show Tchol 140, HDL 46, LDL 75, TG 102 -would not add aspirin as she is on dabigatran  Strong family history of heart disease/heart failure: she is unclear of the etiology of this  Cardiac risk counseling and prevention recommendations: -recommend heart healthy/Mediterranean diet, with whole grains, fruits, vegetable, fish, lean meats, nuts, and olive oil. Limit salt. -recommend moderate walking, 3-5 times/week for 30-50 minutes each session. Aim for at least 150 minutes.week. Goal should be pace of 3 miles/hours, or walking 1.5 miles in 30 minutes -recommend avoidance of tobacco products. Avoid excess alcohol. -Additional risk factor control:  -Weight: BMI 42. Would benefit from weight loss given morbid obesity with comorbidities -ASCVD risk score: The 10-year ASCVD risk score Mikey Bussing DC Brooke Bonito., et al., 2013) is: 14%   Values used to calculate the score:     Age: 41 years     Sex: Female     Is Non-Hispanic African American: Yes     Diabetic: Yes     Tobacco smoker: No     Systolic Blood Pressure: 469 mmHg     Is BP treated: Yes     HDL Cholesterol: 46 mg/dL     Total Cholesterol: 140 mg/dL    Plan for follow up: To be determined based on results of testing. I will see her at her TEE 4/8  High complexity care, discussed ER/hospital today. Will expedite testing.  Buford Dresser, MD, PhD [place]  [facility]    Medication Adjustments/Labs and Tests Ordered: Current medicines are reviewed at length with the patient today.  Concerns regarding medicines are outlined above.  Orders Placed This Encounter  Procedures  . Basic metabolic panel  . CBC  . Brain natriuretic  peptide   Meds ordered this encounter  Medications  . torsemide 40 MG TABS    Sig: Take 40 mg by mouth 2 (two) times daily.    Dispense:  60 tablet    Refill:  11  . aspirin chewable tablet 324 mg  . nitroGLYCERIN (NITROSTAT) SL tablet 0.4 mg    Patient Instructions  Medication Instructions:  Stop Lasix 40 mg  Start Torsemide 40 mg twice a day   *If you need a refill on your cardiac medications before your next appointment, please call your pharmacy*   Lab Work: Your physician recommends lab work today (CBC, BMP, BNP).  If you have labs (blood work) drawn today and your tests are completely normal, you will receive your results only by: Marland Kitchen MyChart Message (if you have MyChart) OR . A paper copy in the mail If you have any lab test that is abnormal or we need to change your treatment, we will call you to review the results.   Testing/Procedures: Your physician has requested that you have a TEE. During a TEE, sound waves are used to create images of your heart. It provides your doctor with information about the size and shape of your heart and how well your heart's chambers and valves are working. In this test, a transducer is attached to the end of a flexible tube that's guided down your throat and into your esophagus (the tube leading from you mouth to your stomach) to get a more detailed image of your heart. You are not awake for the procedure. Please see the instruction sheet given to you today. For further information please visit [email or web address]     Follow-Up: At [facility], you and your  health needs are our priority.  As part of our continuing mission to provide you with exceptional heart care, we have created designated Provider Care Teams.  These Care Teams include your primary Cardiologist (physician) and Advanced Practice Providers (APPs -  Physician Assistants and Nurse Practitioners) who all work together to provide you with the care you  need, when you need it.  We recommend signing up for the patient portal called "MyChart".  Sign up information is provided on this After Visit Summary.  MyChart is used to connect with patients for Virtual Visits (Telemedicine).  Patients are able to view lab/test results, encounter notes, upcoming appointments, etc.  Non-urgent messages can be sent to your provider as well.   To learn more about what you can do with MyChart, go to [email or web address].    Your next appointment:   Based on test result  The format for your next appointment:   In Person  Provider:   Buford Dresser, MD    You are scheduled for a TEE on Friday 11/26/20 with Dr. Harrell Gave.  Please arrive at the [facility] (Main Entrance A) at [facility]: [address] [place], [place] [address] at 7 am  DIET: Nothing to eat or drink after midnight except a sip of water with medications (see medication instructions below)  Medication Instructions: Hold Levemir the night before and morning of procedure Hold Novolog the morning of procedure    Continue your anticoagulant: Eliquis You will need to continue your anticoagulant after your procedure until you are told by your  Provider that it is safe to stop   Labs:  Today (CBC, BMP, BNP)   You must have a responsible person to drive you home and stay in the waiting area during your procedure. Failure to do so could result in cancellation.  Bring your insurance cards.  *Special Note: Every effort is made to have your procedure done on time. Occasionally there are emergencies that occur at the hospital that may cause delays. Please be patient if a delay does occur.   You will need to have the coronavirus test completed prior to your procedure. An appointment has been made at 10:15 am on Tuesday 11/23/20. This is a Drive Up Visit at [address], [place], [place] [address]. Please tell them that you are there for procedure testing. Stay in your car  and someone will be with you shortly. Please make sure to have all other labs completed before this test because you will need to stay quarantined until your procedure.        I,Alexis Bryant,acting as a Education administrator for [employer], MD.,have documented all relevant documentation on the behalf of Buford Dresser, MD,as directed by  Buford Dresser, MD while in the presence of Buford Dresser, MD.  Signed, Buford Dresser, MD PhD 11/22/2020    [place]

## 2020-11-22 NOTE — Telephone Encounter (Signed)
Spoke to patient she stated her prescriptions for eliquis and torsemide were not sent to [facility] [place].Advised I will call pharmacist. [facility] [place] [facility] unable to get pharmacy to answer.Spoke to patient advised her chart indicates both prescriptions were sent in.Advised to check with pharmacy and call back if any problems.

## 2020-11-23 ENCOUNTER — Encounter: Payer: Self-pay | Admitting: Cardiology

## 2020-11-23 ENCOUNTER — Telehealth: Payer: Self-pay

## 2020-11-23 ENCOUNTER — Other Ambulatory Visit (HOSPITAL_COMMUNITY)
Admission: RE | Admit: 2020-11-23 | Discharge: 2020-11-23 | Disposition: A | Discharge status: Home / Self Care | Payer: Medicare Other | Source: Ambulatory Visit | Attending: Cardiology | Admitting: Cardiology

## 2020-11-23 DIAGNOSIS — Z20822 Contact with and (suspected) exposure to covid-19: Secondary | ICD-10-CM | POA: Insufficient documentation

## 2020-11-23 DIAGNOSIS — Z01812 Encounter for preprocedural laboratory examination: Secondary | ICD-10-CM | POA: Insufficient documentation

## 2020-11-23 LAB — BASIC METABOLIC PANEL
BUN/Creatinine Ratio: 11 — ABNORMAL LOW (ref 12–28)
BUN: 19 mg/dL (ref 8–27)
CO2: 26 mmol/L (ref 20–29)
Calcium: 9 mg/dL (ref 8.7–10.3)
Chloride: 102 mmol/L (ref 96–106)
Creatinine, Ser: 1.79 mg/dL — ABNORMAL HIGH (ref 0.57–1.00)
Glucose: 118 mg/dL — ABNORMAL HIGH (ref 65–99)
Potassium: 3.9 mmol/L (ref 3.5–5.2)
Sodium: 144 mmol/L (ref 134–144)
eGFR: 31 mL/min/{1.73_m2} — ABNORMAL LOW (ref >59)

## 2020-11-23 LAB — CBC
Hematocrit: 33.6 % — ABNORMAL LOW (ref 34.0–46.6)
Hemoglobin: 11.2 g/dL (ref 11.1–15.9)
MCH: 29.1 pg (ref 26.6–33.0)
MCHC: 33.3 g/dL (ref 31.5–35.7)
MCV: 87 fL (ref 79–97)
Platelets: 256 10*3/uL (ref 150–450)
RBC: 3.85 x10E6/uL (ref 3.77–5.28)
RDW: 13.5 % (ref 11.7–15.4)
WBC: 8.6 10*3/uL (ref 3.4–10.8)

## 2020-11-23 LAB — SARS CORONAVIRUS 2 (TAT 6-24 HRS): SARS Coronavirus 2: NEGATIVE

## 2020-11-23 LAB — BRAIN NATRIURETIC PEPTIDE: BNP: 132.8 pg/mL — ABNORMAL HIGH (ref 0.0–100.0)

## 2020-11-23 NOTE — Telephone Encounter (Signed)
Copied from [place] [id number]. Topic: General - Inquiry >> Nov 23, 2020  8:08 AM Oneta Rack wrote: Reason for CRM: patient requesting to speak with PCP and states its personal and denied to elaborate.  Please advice

## 2020-11-23 NOTE — Telephone Encounter (Signed)
Patient calling back. She states the pharmacy did not have 40 mg tablets and gave her 20 mg. She states they told her to take 2 in the morning and 2 at night. She states she is allergic to sulphur and they also said there is a little sulphur in the pill in the middle, but the pharmacist did not think it would be a problem. She would like to know if it is okay for her to take.

## 2020-11-23 NOTE — Telephone Encounter (Signed)
Pt state she is currently heading to the pharmacy to pick up medication and will contact office if she has any issue.

## 2020-11-23 NOTE — Telephone Encounter (Signed)
Pt updated and verbalized understanding.  

## 2020-11-23 NOTE — Telephone Encounter (Signed)
It is possible to that torsemide could trigger her sulfa allergy. However, her past medical history lists that she has previously been on furosemide.  If she was able to tolerate furosemide without a reaction, it is likely she will also be able to tolerate torsemide without a reaction.

## 2020-11-25 ENCOUNTER — Other Ambulatory Visit: Payer: Self-pay | Admitting: *Deleted

## 2020-11-25 ENCOUNTER — Telehealth: Payer: Self-pay | Admitting: Cardiology

## 2020-11-25 DIAGNOSIS — I340 Nonrheumatic mitral (valve) insufficiency: Secondary | ICD-10-CM

## 2020-11-25 DIAGNOSIS — I071 Rheumatic tricuspid insufficiency: Secondary | ICD-10-CM

## 2020-11-25 NOTE — Telephone Encounter (Signed)
Pt c/o medication issue:  1. Name of Medication: apixaban (ELIQUIS) 5 MG TABS tablet  2. How are you currently taking this medication (dosage and times per day)? 1 tablet by mouth 2 times daily  3. Are you having a reaction (difficulty breathing--STAT)?  Yes patient states that she itches real bad all over her bad.  4. What is your medication issue? Patient is look to switch mediation to something else. She dont like they way it makes her feel.

## 2020-11-25 NOTE — Telephone Encounter (Signed)
There is no sulfa in Eliquis.  If she is having an allergic reaction to a sulfa containing medication it is more likely the torsemide which she called earlier in the week about.   At appointment on 4/4, patient complained of swelling and SOB so hesistant to d/c torsemide at this time.  Since she was tolerating furosemide previously this could be an option to her.  We can also try to do a PA for her Pradaxa

## 2020-11-25 NOTE — Telephone Encounter (Signed)
If it is rash only and not throat swelling, I will see her tomorrow for her TEE. She can hold torsemide until I see her. Will try to make one change at a time.

## 2020-11-25 NOTE — Telephone Encounter (Signed)
Will route to pharmd pool for further assistance

## 2020-11-25 NOTE — Telephone Encounter (Signed)
Returned call to patient-patient believes she is having a reaction to the Eliquis that was started last week due to insurance not covering Pradaxa.  She states she is allergic to sulfa (itching) and was told by the pharmacist that Eliquis has sulfa in it.   She states this morning she is itching all over-groin area, arms, head, under arms.  Denies rash, swelling, or SOB.    States this is the same reaction to sulfa antibiotics.     Advised would send to pharmD and MD to review.  ED precautions reviewed.

## 2020-11-25 NOTE — Anesthesia Preprocedure Evaluation (Addendum)
Anesthesia Evaluation  Patient identified by MRN, date of birth, ID band Patient awake    Reviewed: Allergy & Precautions, NPO status , Patient's Chart, lab work & pertinent test results  Airway Mallampati: III  TM Distance: >3 FB Neck ROM: Full    Dental no notable dental hx. (+) Missing, Dental Advisory Given, Poor Dentition,    Pulmonary asthma , sleep apnea and Continuous Positive Airway Pressure Ventilation ,    Pulmonary exam normal breath sounds clear to auscultation       Cardiovascular hypertension, +CHF  Normal cardiovascular exam+ dysrhythmias Atrial Fibrillation  Rhythm:Irregular Rate:Abnormal  06/2020 Echo 1. Left ventricular ejection fraction, by estimation, is 45 to 50%. The  left ventricle has mildly decreased function. The left ventricle  demonstrates global hypokinesis. Left ventricular diastolic function could  not be evaluated.  2. Right ventricular systolic function was not well visualized. The right  ventricular size is moderately enlarged. There is normal pulmonary artery  systolic pressure.    Neuro/Psych  Headaches, PSYCHIATRIC DISORDERS Depression    GI/Hepatic GERD  ,  Endo/Other  diabetes, Type 2, Insulin Dependent  Renal/GU Renal disease     Musculoskeletal  (+) Arthritis ,   Abdominal (+) + obese,   Peds  Hematology   Anesthesia Other Findings   Reproductive/Obstetrics                          Anesthesia Physical Anesthesia Plan  ASA: III  Anesthesia Plan: MAC   Post-op Pain Management:    Induction:   PONV Risk Score and Plan: Treatment may vary due to age or medical condition  Airway Management Planned: Natural Airway  Additional Equipment: None  Intra-op Plan:   Post-operative Plan:   Informed Consent:     Dental advisory given  Plan Discussed with: CRNA and Anesthesiologist  Anesthesia Plan Comments:        Anesthesia Quick  Evaluation

## 2020-11-25 NOTE — Telephone Encounter (Signed)
Spoke to patient, aware of recommendations and verbalized understanding.   

## 2020-11-26 ENCOUNTER — Ambulatory Visit (HOSPITAL_BASED_OUTPATIENT_CLINIC_OR_DEPARTMENT_OTHER)
Admission: RE | Admit: 2020-11-26 | Discharge: 2020-11-26 | Disposition: A | Discharge status: Home / Self Care | Payer: Medicare Other | Source: Ambulatory Visit | Attending: Cardiology | Admitting: Cardiology

## 2020-11-26 ENCOUNTER — Ambulatory Visit (HOSPITAL_COMMUNITY): Payer: Medicare Other | Admitting: Anesthesiology

## 2020-11-26 ENCOUNTER — Ambulatory Visit (HOSPITAL_COMMUNITY): Payer: Medicare Other | Admitting: Nurse Practitioner

## 2020-11-26 ENCOUNTER — Telehealth (INDEPENDENT_AMBULATORY_CARE_PROVIDER_SITE_OTHER): Payer: Self-pay | Admitting: Primary Care

## 2020-11-26 ENCOUNTER — Encounter (HOSPITAL_COMMUNITY): Payer: Self-pay | Admitting: Cardiology

## 2020-11-26 ENCOUNTER — Encounter (HOSPITAL_COMMUNITY)
Admission: RE | Disposition: A | Discharge status: Home / Self Care | Payer: Self-pay | Source: Home / Self Care | Attending: Cardiology

## 2020-11-26 ENCOUNTER — Ambulatory Visit (HOSPITAL_COMMUNITY)
Admission: RE | Admit: 2020-11-26 | Discharge: 2020-11-26 | Disposition: A | Discharge status: Home / Self Care | Payer: Medicare Other | Attending: Cardiology | Admitting: Cardiology

## 2020-11-26 ENCOUNTER — Other Ambulatory Visit: Payer: Self-pay

## 2020-11-26 DIAGNOSIS — Z9049 Acquired absence of other specified parts of digestive tract: Secondary | ICD-10-CM | POA: Insufficient documentation

## 2020-11-26 DIAGNOSIS — Z86718 Personal history of other venous thrombosis and embolism: Secondary | ICD-10-CM | POA: Diagnosis not present

## 2020-11-26 DIAGNOSIS — Z79899 Other long term (current) drug therapy: Secondary | ICD-10-CM | POA: Diagnosis not present

## 2020-11-26 DIAGNOSIS — I5043 Acute on chronic combined systolic (congestive) and diastolic (congestive) heart failure: Secondary | ICD-10-CM | POA: Diagnosis not present

## 2020-11-26 DIAGNOSIS — I4811 Longstanding persistent atrial fibrillation: Secondary | ICD-10-CM | POA: Diagnosis not present

## 2020-11-26 DIAGNOSIS — Z8249 Family history of ischemic heart disease and other diseases of the circulatory system: Secondary | ICD-10-CM | POA: Diagnosis not present

## 2020-11-26 DIAGNOSIS — E1169 Type 2 diabetes mellitus with other specified complication: Secondary | ICD-10-CM | POA: Insufficient documentation

## 2020-11-26 DIAGNOSIS — I340 Nonrheumatic mitral (valve) insufficiency: Secondary | ICD-10-CM | POA: Diagnosis not present

## 2020-11-26 DIAGNOSIS — Z882 Allergy status to sulfonamides status: Secondary | ICD-10-CM | POA: Diagnosis not present

## 2020-11-26 DIAGNOSIS — G4733 Obstructive sleep apnea (adult) (pediatric): Secondary | ICD-10-CM | POA: Diagnosis not present

## 2020-11-26 DIAGNOSIS — I2510 Atherosclerotic heart disease of native coronary artery without angina pectoris: Secondary | ICD-10-CM | POA: Diagnosis not present

## 2020-11-26 DIAGNOSIS — I110 Hypertensive heart disease with heart failure: Secondary | ICD-10-CM | POA: Diagnosis not present

## 2020-11-26 DIAGNOSIS — Z6841 Body Mass Index (BMI) 40.0 and over, adult: Secondary | ICD-10-CM | POA: Diagnosis not present

## 2020-11-26 DIAGNOSIS — J449 Chronic obstructive pulmonary disease, unspecified: Secondary | ICD-10-CM | POA: Diagnosis not present

## 2020-11-26 DIAGNOSIS — D6869 Other thrombophilia: Secondary | ICD-10-CM | POA: Diagnosis not present

## 2020-11-26 DIAGNOSIS — I081 Rheumatic disorders of both mitral and tricuspid valves: Secondary | ICD-10-CM | POA: Diagnosis present

## 2020-11-26 DIAGNOSIS — I361 Nonrheumatic tricuspid (valve) insufficiency: Secondary | ICD-10-CM

## 2020-11-26 DIAGNOSIS — I428 Other cardiomyopathies: Secondary | ICD-10-CM | POA: Insufficient documentation

## 2020-11-26 DIAGNOSIS — Z794 Long term (current) use of insulin: Secondary | ICD-10-CM | POA: Insufficient documentation

## 2020-11-26 DIAGNOSIS — E785 Hyperlipidemia, unspecified: Secondary | ICD-10-CM | POA: Diagnosis not present

## 2020-11-26 DIAGNOSIS — Z7901 Long term (current) use of anticoagulants: Secondary | ICD-10-CM | POA: Diagnosis not present

## 2020-11-26 HISTORY — PX: BUBBLE STUDY: SHX6837

## 2020-11-26 HISTORY — PX: TEE WITHOUT CARDIOVERSION: SHX5443

## 2020-11-26 LAB — GLUCOSE, CAPILLARY: Glucose-Capillary: 137 mg/dL — ABNORMAL HIGH (ref 70–99)

## 2020-11-26 LAB — ECHO TEE
MV M vel: 5.46 m/s
MV Peak grad: 119.2 mmHg
Radius: 0.6 cm

## 2020-11-26 SURGERY — ECHOCARDIOGRAM, TRANSESOPHAGEAL
Anesthesia: Monitor Anesthesia Care

## 2020-11-26 MED ORDER — PROPOFOL 10 MG/ML IV BOLUS
INTRAVENOUS | Status: DC | PRN
  Administered 2020-11-26: 30 mg via INTRAVENOUS
  Administered 2020-11-26: 20 mg via INTRAVENOUS
  Administered 2020-11-26: 70 mg via INTRAVENOUS

## 2020-11-26 MED ORDER — PROPOFOL 500 MG/50ML IV EMUL
INTRAVENOUS | Status: DC | PRN
  Administered 2020-11-26: 100 ug/kg/min via INTRAVENOUS

## 2020-11-26 MED ORDER — SODIUM CHLORIDE 0.9 % IV SOLN: INTRAVENOUS | Status: DC | PRN

## 2020-11-26 NOTE — Telephone Encounter (Signed)
[name] calling to get updated Rx for the pt's shoes. Only the shoes were selected and they need shoes and inserts selected   cb [phone]  Fax [phone]

## 2020-11-26 NOTE — Progress Notes (Signed)
  Echocardiogram Echocardiogram Transesophageal has been performed.  Jennette Dubin 11/26/2020, 9:52 AM

## 2020-11-26 NOTE — Telephone Encounter (Signed)
Has this order been signed by Dr. Margarita Rana? This request is over 78 weeks old

## 2020-11-26 NOTE — CV Procedure (Signed)
    TRANSESOPHAGEAL ECHOCARDIOGRAM   NAME:  [name]   MRN: [id number] DOB:  Aug 29, 1955   ADMIT DATE: 11/26/2020  INDICATIONS: Mitral regurgitation  PROCEDURE:   Informed consent was obtained prior to the procedure. The risks, benefits and alternatives for the procedure were discussed and the patient comprehended these risks.  Risks include, but are not limited to, cough, sore throat, vomiting, nausea, somnolence, esophageal and stomach trauma or perforation, bleeding, low blood pressure, aspiration, pneumonia, infection, trauma to the teeth and death.    Procedural time out performed. Patient received monitored anesthesia care under the supervision of Dr. Valma Cava. Patient received a total of 789 mg propofol during the procedure.  The transesophageal probe was inserted in the esophagus and stomach without difficulty and multiple views were obtained.   Dr. Margaretann Loveless assisted with the procedure   COMPLICATIONS:    There were no immediate complications.  FINDINGS:  LEFT VENTRICLE: EF = 45-50% with mild global hypokinesis  RIGHT VENTRICLE: Moderately enlarged size and moderately reduced function.   LEFT ATRIUM: No thrombus/mass. Severely dilated  LEFT ATRIAL APPENDAGE: No thrombus/mass.   RIGHT ATRIUM: No thrombus/mass. Severely dilated  AORTIC VALVE:  Trileaflet. No regurgitation. No vegetation.  MITRAL VALVE:    Abnormal, mildly rheumatic in appearance. Severe regurgitation. No vegetation. Posterior leaflet appears restricted with override of anterior leaflet.  TRICUSPID VALVE: Normal structure. Severe regurgitation. No vegetation.  PULMONIC VALVE: Grossly normal structure. Mild regurgitation. No apparent vegetation.  INTERATRIAL SEPTUM: No PFO or ASD seen by color Doppler.  PERICARDIUM: No effusion noted.  DESCENDING AORTA: Mild diffuse plaque seen   CONCLUSION:Severe mitral regurgitation (PV systolic flow reversal) likely due to restricted posterior leaflet  and overriding anterior leaflet. Severe TR.   Buford Dresser, MD, PhD [facility]  [address], [place], [place] [address] [phone]   9:29 AM

## 2020-11-26 NOTE — Anesthesia Procedure Notes (Signed)
Procedure Name: Laie Performed by: Lowella Dell, CRNA Pre-anesthesia Checklist: Patient identified, Emergency Drugs available, Suction available, Patient being monitored and Timeout performed Patient Re-evaluated:Patient Re-evaluated prior to induction Oxygen Delivery Method: Nasal cannula Placement Confirmation: positive ETCO2 Dental Injury: Teeth and Oropharynx as per pre-operative assessment

## 2020-11-26 NOTE — Anesthesia Postprocedure Evaluation (Signed)
Anesthesia Post Note  Patient: [name]  Procedure(s) Performed: TRANSESOPHAGEAL ECHOCARDIOGRAM (TEE) (N/A ) BUBBLE STUDY     Patient location during evaluation: Endoscopy Anesthesia Type: MAC Level of consciousness: awake and alert Pain management: pain level controlled Vital Signs Assessment: post-procedure vital signs reviewed and stable Respiratory status: spontaneous breathing, nonlabored ventilation, respiratory function stable and patient connected to nasal cannula oxygen Cardiovascular status: blood pressure returned to baseline and stable Postop Assessment: no apparent nausea or vomiting Anesthetic complications: no   No complications documented.  Last Vitals:  Vitals:   11/26/20 0955 11/26/20 1000  BP: 131/80 (!) 144/76  Pulse: 69 61  Resp: (!) 21 18  Temp:    SpO2: 99% 99%    Last Pain:  Vitals:   11/26/20 1000  TempSrc:   PainSc: 0-No pain                 Barnet Glasgow

## 2020-11-26 NOTE — Telephone Encounter (Signed)
Call number listed only a busy signal . Unable to contact patient

## 2020-11-26 NOTE — Interval H&P Note (Signed)
History and Physical Interval Note:  11/26/2020 7:29 AM  [name]  has presented today for surgery, with the diagnosis of MITRAILVALVE REGURGATATION.  The various methods of treatment have been discussed with the patient and family. After consideration of risks, benefits and other options for treatment, the patient has consented to  Procedure(s): TRANSESOPHAGEAL ECHOCARDIOGRAM (TEE) (N/A) as a surgical intervention.  The patient's history has been reviewed, patient examined, no change in status, stable for surgery.  I have reviewed the patient's chart and labs.  Questions were answered to the patient's satisfaction.     [name]

## 2020-11-26 NOTE — Transfer of Care (Signed)
Immediate Anesthesia Transfer of Care Note  Patient: [name]  Procedure(s) Performed: TRANSESOPHAGEAL ECHOCARDIOGRAM (TEE) (N/A ) BUBBLE STUDY  Patient Location: PACU and Endoscopy Unit  Anesthesia Type:MAC  Level of Consciousness: drowsy and patient cooperative  Airway & Oxygen Therapy: Patient Spontanous Breathing and Patient connected to nasal cannula oxygen  Post-op Assessment: Report given to RN and Post -op Vital signs reviewed and stable  Post vital signs: Reviewed and stable  Last Vitals:  Vitals Value Taken Time  BP 131/72 11/26/20 0933  Temp    Pulse 63 11/26/20 0937  Resp 20 11/26/20 0937  SpO2 98 % 11/26/20 0937  Vitals shown include unvalidated device data.  Last Pain:  Vitals:   11/26/20 0720  PainSc: 0-No pain         Complications: No complications documented.

## 2020-11-26 NOTE — Addendum Note (Signed)
Addended by: Merri Ray A on: 11/26/2020 03:38 PM   Modules accepted: Orders

## 2020-11-26 NOTE — Discharge Instructions (Signed)
TEE  YOU HAD AN CARDIAC PROCEDURE TODAY: Refer to the procedure report and other information in the discharge instructions given to you for any specific questions about what was found during the examination. If this information does not answer your questions, please call [facility] office at [phone] to clarify.   DIET: Your first meal following the procedure should be a light meal and then it is ok to progress to your normal diet. A half-sandwich or bowl of soup is an example of a good first meal. Heavy or fried foods are harder to digest and may make you feel nauseous or bloated. Drink plenty of fluids but you should avoid alcoholic beverages for 24 hours. If you had a esophageal dilation, please see attached instructions for diet.   ACTIVITY: Your care partner should take you home directly after the procedure. You should plan to take it easy, moving slowly for the rest of the day. You can resume normal activity the day after the procedure however YOU SHOULD NOT DRIVE, use power tools, machinery or perform tasks that involve climbing or major physical exertion for 24 hours (because of the sedation medicines used during the test).   SYMPTOMS TO REPORT IMMEDIATELY: A cardiologist can be reached at any hour. Please call [phone] for any of the following symptoms:  Vomiting of blood or coffee ground material  New, significant abdominal pain  New, significant chest pain or pain under the shoulder blades  Painful or persistently difficult swallowing  New shortness of breath  Black, tarry-looking or red, bloody stools  FOLLOW UP:  Please also call with any specific questions about appointments or follow up tests.   

## 2020-11-29 ENCOUNTER — Encounter (HOSPITAL_COMMUNITY): Payer: Self-pay | Admitting: Cardiology

## 2020-11-30 ENCOUNTER — Other Ambulatory Visit (INDEPENDENT_AMBULATORY_CARE_PROVIDER_SITE_OTHER): Payer: Self-pay | Admitting: Primary Care

## 2020-11-30 NOTE — Telephone Encounter (Signed)
Patient is requesting that medication be filled by provider  Review for continued use or refill    Requested Prescriptions  Pending Prescriptions Disp Refills   LEVEMIR 100 UNIT/ML injection [Pharmacy Med Name: LEVEMIR 100 UNIT/ML VIAL] 60 mL 1    Sig: INJECT 0.4 MLS (40 UNITS TOTAL) INTO THE SKIN 2 (TWO) TIMES DAILY.      Endocrinology:  Diabetes - Insulins Failed - 11/30/2020 12:22 PM      Failed - HBA1C is between 0 and 7.9 and within 180 days    HbA1c, POC (controlled diabetic range)  Date Value Ref Range Status  09/24/2020 9.9 (A) 0.0 - 7.0 % Final   Hgb A1c MFr Bld  Date Value Ref Range Status  11/02/2020 9.3 (H) 4.8 - 5.6 % Final    Comment:    (NOTE) Pre diabetes:          5.7%-6.4%  Diabetes:              >6.4%  Glycemic control for   <7.0% adults with diabetes           Passed - Valid encounter within last 6 months    Recent Outpatient Visits           2 months ago Diabetes mellitus type 2 in obese ([place])   [place], Michelle P, NP   5 months ago Type 2 diabetes mellitus without complication, without long-term current use of insulin ([place])   [place], [place] P, NP   6 months ago Diabetes mellitus type 2 in obese [facility])   [place], Annie Main L, RPH-CPP   7 months ago Diabetes mellitus type 2 in obese [facility])   [place], Stephen L, RPH-CPP   8 months ago Hospital discharge follow-up   [place], [place], NP       Future Appointments             In 1 week Buford Dresser, MD [place], [place]   In 3 weeks [name], NP [place]

## 2020-12-01 NOTE — Telephone Encounter (Signed)
completed

## 2020-12-05 ENCOUNTER — Other Ambulatory Visit (INDEPENDENT_AMBULATORY_CARE_PROVIDER_SITE_OTHER): Payer: Self-pay | Admitting: Primary Care

## 2020-12-05 DIAGNOSIS — Z760 Encounter for issue of repeat prescription: Secondary | ICD-10-CM

## 2020-12-05 NOTE — Telephone Encounter (Signed)
Requested Prescriptions  Pending Prescriptions Disp Refills  . TRULICITY 1.5 BU/3.8GT SOPN [Pharmacy Med Name: TRULICITY 1.5 XM/4.6 ML PEN] 0.5 mL 1    Sig: INJECT 1.5 MG INTO THE SKIN ONCE A WEEK.     Endocrinology:  Diabetes - GLP-1 Receptor Agonists Failed - 12/05/2020 12:47 AM      Failed - HBA1C is between 0 and 7.9 and within 180 days    HbA1c, POC (controlled diabetic range)  Date Value Ref Range Status  09/24/2020 9.9 (A) 0.0 - 7.0 % Final   Hgb A1c MFr Bld  Date Value Ref Range Status  11/02/2020 9.3 (H) 4.8 - 5.6 % Final    Comment:    (NOTE) Pre diabetes:          5.7%-6.4%  Diabetes:              >6.4%  Glycemic control for   <7.0% adults with diabetes          Passed - Valid encounter within last 6 months    Recent Outpatient Visits          2 months ago Diabetes mellitus type 2 in obese ([place])   [place], Michelle P, NP   5 months ago Type 2 diabetes mellitus without complication, without long-term current use of insulin ([place])   [place], [place] P, NP   7 months ago Diabetes mellitus type 2 in obese [facility])   [place], Annie Main L, RPH-CPP   7 months ago Diabetes mellitus type 2 in obese [facility])   [place], Stephen L, RPH-CPP   8 months ago Hospital discharge follow-up   [place], [place], NP      Future Appointments            In 2 weeks Oletta Lamas, [place] Cage, NP [place]   In 2 weeks Buford Dresser, MD [place], [facility]

## 2020-12-13 ENCOUNTER — Ambulatory Visit: Payer: Medicare Other | Admitting: Cardiology

## 2020-12-19 ENCOUNTER — Emergency Department (HOSPITAL_COMMUNITY): Payer: Medicare Other

## 2020-12-19 ENCOUNTER — Other Ambulatory Visit: Payer: Self-pay

## 2020-12-19 ENCOUNTER — Inpatient Hospital Stay (HOSPITAL_COMMUNITY)
Admission: EM | Admit: 2020-12-19 | Discharge: 2021-01-12 | DRG: 216 | Disposition: A | Discharge status: Home Health | Payer: Medicare Other | Attending: Thoracic Surgery (Cardiothoracic Vascular Surgery) | Admitting: Thoracic Surgery (Cardiothoracic Vascular Surgery)

## 2020-12-19 DIAGNOSIS — R278 Other lack of coordination: Secondary | ICD-10-CM | POA: Diagnosis not present

## 2020-12-19 DIAGNOSIS — I5023 Acute on chronic systolic (congestive) heart failure: Secondary | ICD-10-CM

## 2020-12-19 DIAGNOSIS — K5900 Constipation, unspecified: Secondary | ICD-10-CM | POA: Diagnosis not present

## 2020-12-19 DIAGNOSIS — Z79899 Other long term (current) drug therapy: Secondary | ICD-10-CM

## 2020-12-19 DIAGNOSIS — N183 Chronic kidney disease, stage 3 unspecified: Secondary | ICD-10-CM

## 2020-12-19 DIAGNOSIS — T426X5A Adverse effect of other antiepileptic and sedative-hypnotic drugs, initial encounter: Secondary | ICD-10-CM | POA: Diagnosis not present

## 2020-12-19 DIAGNOSIS — I4892 Unspecified atrial flutter: Secondary | ICD-10-CM | POA: Diagnosis not present

## 2020-12-19 DIAGNOSIS — Z9889 Other specified postprocedural states: Secondary | ICD-10-CM

## 2020-12-19 DIAGNOSIS — Z86718 Personal history of other venous thrombosis and embolism: Secondary | ICD-10-CM

## 2020-12-19 DIAGNOSIS — R0602 Shortness of breath: Secondary | ICD-10-CM

## 2020-12-19 DIAGNOSIS — N990 Postprocedural (acute) (chronic) kidney failure: Secondary | ICD-10-CM | POA: Diagnosis not present

## 2020-12-19 DIAGNOSIS — I509 Heart failure, unspecified: Secondary | ICD-10-CM | POA: Diagnosis not present

## 2020-12-19 DIAGNOSIS — I130 Hypertensive heart and chronic kidney disease with heart failure and stage 1 through stage 4 chronic kidney disease, or unspecified chronic kidney disease: Secondary | ICD-10-CM | POA: Diagnosis not present

## 2020-12-19 DIAGNOSIS — E871 Hypo-osmolality and hyponatremia: Secondary | ICD-10-CM | POA: Diagnosis not present

## 2020-12-19 DIAGNOSIS — F32A Depression, unspecified: Secondary | ICD-10-CM | POA: Diagnosis present

## 2020-12-19 DIAGNOSIS — T501X6A Underdosing of loop [high-ceiling] diuretics, initial encounter: Secondary | ICD-10-CM | POA: Diagnosis present

## 2020-12-19 DIAGNOSIS — Z794 Long term (current) use of insulin: Secondary | ICD-10-CM

## 2020-12-19 DIAGNOSIS — E876 Hypokalemia: Secondary | ICD-10-CM | POA: Diagnosis not present

## 2020-12-19 DIAGNOSIS — D62 Acute posthemorrhagic anemia: Secondary | ICD-10-CM | POA: Diagnosis not present

## 2020-12-19 DIAGNOSIS — E785 Hyperlipidemia, unspecified: Secondary | ICD-10-CM | POA: Diagnosis present

## 2020-12-19 DIAGNOSIS — I5043 Acute on chronic combined systolic (congestive) and diastolic (congestive) heart failure: Secondary | ICD-10-CM | POA: Diagnosis present

## 2020-12-19 DIAGNOSIS — E1140 Type 2 diabetes mellitus with diabetic neuropathy, unspecified: Secondary | ICD-10-CM | POA: Diagnosis present

## 2020-12-19 DIAGNOSIS — Z7982 Long term (current) use of aspirin: Secondary | ICD-10-CM

## 2020-12-19 DIAGNOSIS — G4733 Obstructive sleep apnea (adult) (pediatric): Secondary | ICD-10-CM | POA: Diagnosis present

## 2020-12-19 DIAGNOSIS — I340 Nonrheumatic mitral (valve) insufficiency: Secondary | ICD-10-CM

## 2020-12-19 DIAGNOSIS — N170 Acute kidney failure with tubular necrosis: Secondary | ICD-10-CM | POA: Diagnosis not present

## 2020-12-19 DIAGNOSIS — K08109 Complete loss of teeth, unspecified cause, unspecified class: Secondary | ICD-10-CM

## 2020-12-19 DIAGNOSIS — I700 Atherosclerosis of aorta: Secondary | ICD-10-CM | POA: Diagnosis present

## 2020-12-19 DIAGNOSIS — Z803 Family history of malignant neoplasm of breast: Secondary | ICD-10-CM

## 2020-12-19 DIAGNOSIS — Z8249 Family history of ischemic heart disease and other diseases of the circulatory system: Secondary | ICD-10-CM

## 2020-12-19 DIAGNOSIS — Z90710 Acquired absence of both cervix and uterus: Secondary | ICD-10-CM

## 2020-12-19 DIAGNOSIS — E1122 Type 2 diabetes mellitus with diabetic chronic kidney disease: Secondary | ICD-10-CM | POA: Diagnosis present

## 2020-12-19 DIAGNOSIS — R112 Nausea with vomiting, unspecified: Secondary | ICD-10-CM

## 2020-12-19 DIAGNOSIS — K06020 Generalized gingival recession, unspecified: Secondary | ICD-10-CM | POA: Diagnosis present

## 2020-12-19 DIAGNOSIS — I440 Atrioventricular block, first degree: Secondary | ICD-10-CM | POA: Diagnosis present

## 2020-12-19 DIAGNOSIS — I2510 Atherosclerotic heart disease of native coronary artery without angina pectoris: Secondary | ICD-10-CM | POA: Diagnosis present

## 2020-12-19 DIAGNOSIS — K219 Gastro-esophageal reflux disease without esophagitis: Secondary | ICD-10-CM | POA: Diagnosis present

## 2020-12-19 DIAGNOSIS — Z9841 Cataract extraction status, right eye: Secondary | ICD-10-CM

## 2020-12-19 DIAGNOSIS — J9811 Atelectasis: Secondary | ICD-10-CM | POA: Diagnosis not present

## 2020-12-19 DIAGNOSIS — Y92009 Unspecified place in unspecified non-institutional (private) residence as the place of occurrence of the external cause: Secondary | ICD-10-CM

## 2020-12-19 DIAGNOSIS — Z9049 Acquired absence of other specified parts of digestive tract: Secondary | ICD-10-CM

## 2020-12-19 DIAGNOSIS — Z9842 Cataract extraction status, left eye: Secondary | ICD-10-CM

## 2020-12-19 DIAGNOSIS — I4821 Permanent atrial fibrillation: Secondary | ICD-10-CM

## 2020-12-19 DIAGNOSIS — N1832 Chronic kidney disease, stage 3b: Secondary | ICD-10-CM | POA: Diagnosis present

## 2020-12-19 DIAGNOSIS — I081 Rheumatic disorders of both mitral and tricuspid valves: Secondary | ICD-10-CM | POA: Diagnosis present

## 2020-12-19 DIAGNOSIS — R079 Chest pain, unspecified: Secondary | ICD-10-CM

## 2020-12-19 DIAGNOSIS — G540 Brachial plexus disorders: Secondary | ICD-10-CM | POA: Diagnosis not present

## 2020-12-19 DIAGNOSIS — Z7901 Long term (current) use of anticoagulants: Secondary | ICD-10-CM

## 2020-12-19 DIAGNOSIS — Z01818 Encounter for other preprocedural examination: Secondary | ICD-10-CM

## 2020-12-19 DIAGNOSIS — R9431 Abnormal electrocardiogram [ECG] [EKG]: Secondary | ICD-10-CM | POA: Diagnosis present

## 2020-12-19 DIAGNOSIS — N179 Acute kidney failure, unspecified: Secondary | ICD-10-CM

## 2020-12-19 DIAGNOSIS — K036 Deposits [accretions] on teeth: Secondary | ICD-10-CM

## 2020-12-19 DIAGNOSIS — M1990 Unspecified osteoarthritis, unspecified site: Secondary | ICD-10-CM | POA: Diagnosis present

## 2020-12-19 DIAGNOSIS — J449 Chronic obstructive pulmonary disease, unspecified: Secondary | ICD-10-CM | POA: Diagnosis present

## 2020-12-19 DIAGNOSIS — Z6841 Body Mass Index (BMI) 40.0 and over, adult: Secondary | ICD-10-CM

## 2020-12-19 DIAGNOSIS — Z95828 Presence of other vascular implants and grafts: Secondary | ICD-10-CM

## 2020-12-19 DIAGNOSIS — Z20822 Contact with and (suspected) exposure to covid-19: Secondary | ICD-10-CM | POA: Diagnosis present

## 2020-12-19 DIAGNOSIS — K0510 Chronic gingivitis, plaque induced: Secondary | ICD-10-CM

## 2020-12-19 DIAGNOSIS — Z882 Allergy status to sulfonamides status: Secondary | ICD-10-CM

## 2020-12-19 DIAGNOSIS — Z91128 Patient's intentional underdosing of medication regimen for other reason: Secondary | ICD-10-CM

## 2020-12-19 LAB — COMPREHENSIVE METABOLIC PANEL
ALT: 12 U/L (ref 0–44)
AST: 19 U/L (ref 15–41)
Albumin: 3.4 g/dL — ABNORMAL LOW (ref 3.5–5.0)
Alkaline Phosphatase: 87 U/L (ref 38–126)
Anion gap: 9 (ref 5–15)
BUN: 16 mg/dL (ref 8–23)
CO2: 30 mmol/L (ref 22–32)
Calcium: 9.8 mg/dL (ref 8.9–10.3)
Chloride: 102 mmol/L (ref 98–111)
Creatinine, Ser: 1.68 mg/dL — ABNORMAL HIGH (ref 0.44–1.00)
GFR, Estimated: 34 mL/min — ABNORMAL LOW (ref >60)
Glucose, Bld: 164 mg/dL — ABNORMAL HIGH (ref 70–99)
Potassium: 3.5 mmol/L (ref 3.5–5.1)
Sodium: 141 mmol/L (ref 135–145)
Total Bilirubin: 0.9 mg/dL (ref 0.3–1.2)
Total Protein: 7.9 g/dL (ref 6.5–8.1)

## 2020-12-19 LAB — GLUCOSE, CAPILLARY
Glucose-Capillary: 149 mg/dL — ABNORMAL HIGH (ref 70–99)
Glucose-Capillary: 152 mg/dL — ABNORMAL HIGH (ref 70–99)

## 2020-12-19 LAB — CBC WITH DIFFERENTIAL/PLATELET
Abs Immature Granulocytes: 0.02 10*3/uL (ref 0.00–0.07)
Basophils Absolute: 0 10*3/uL (ref 0.0–0.1)
Basophils Relative: 0 %
Eosinophils Absolute: 0 10*3/uL (ref 0.0–0.5)
Eosinophils Relative: 0 %
HCT: 39.4 % (ref 36.0–46.0)
Hemoglobin: 12.2 g/dL (ref 12.0–15.0)
Immature Granulocytes: 0 %
Lymphocytes Relative: 13 %
Lymphs Abs: 0.9 10*3/uL (ref 0.7–4.0)
MCH: 28.5 pg (ref 26.0–34.0)
MCHC: 31 g/dL (ref 30.0–36.0)
MCV: 92.1 fL (ref 80.0–100.0)
Monocytes Absolute: 0.6 10*3/uL (ref 0.1–1.0)
Monocytes Relative: 8 %
Neutro Abs: 5.6 10*3/uL (ref 1.7–7.7)
Neutrophils Relative %: 79 %
Platelets: 187 10*3/uL (ref 150–400)
RBC: 4.28 MIL/uL (ref 3.87–5.11)
RDW: 14.3 % (ref 11.5–15.5)
WBC: 7.1 10*3/uL (ref 4.0–10.5)
nRBC: 0 % (ref 0.0–0.2)

## 2020-12-19 LAB — BRAIN NATRIURETIC PEPTIDE: B Natriuretic Peptide: 227.9 pg/mL — ABNORMAL HIGH (ref 0.0–100.0)

## 2020-12-19 LAB — DIGOXIN LEVEL: Digoxin Level: 0.9 ng/mL (ref 0.8–2.0)

## 2020-12-19 LAB — LIPASE, BLOOD: Lipase: 22 U/L (ref 11–51)

## 2020-12-19 LAB — RESP PANEL BY RT-PCR (FLU A&B, COVID) ARPGX2
Influenza A by PCR: NEGATIVE
Influenza B by PCR: NEGATIVE
SARS Coronavirus 2 by RT PCR: NEGATIVE

## 2020-12-19 LAB — TROPONIN I (HIGH SENSITIVITY)
Troponin I (High Sensitivity): 8 ng/L (ref <18)
Troponin I (High Sensitivity): 12 ng/L (ref <18)

## 2020-12-19 MED ORDER — SODIUM CHLORIDE 0.9 % IV SOLN: 250.0000 mL | INTRAVENOUS | Status: DC | PRN

## 2020-12-19 MED ORDER — SODIUM CHLORIDE 0.9% FLUSH
3.0000 mL | INTRAVENOUS | Status: DC | PRN
  Administered 2020-12-21: 3 mL via INTRAVENOUS

## 2020-12-19 MED ORDER — FUROSEMIDE 10 MG/ML IJ SOLN
60.0000 mg | Freq: Once | INTRAMUSCULAR | Status: AC
  Administered 2020-12-19: 60 mg via INTRAVENOUS
  Filled 2020-12-19: qty 6

## 2020-12-19 MED ORDER — DIGOXIN 125 MCG PO TABS
0.1250 mg | ORAL_TABLET | Freq: Every day | ORAL | Status: DC
  Administered 2020-12-19 – 2020-12-28 (×10): 0.125 mg via ORAL
  Filled 2020-12-19 (×10): qty 1

## 2020-12-19 MED ORDER — NITROGLYCERIN 2 % TD OINT
0.5000 [in_us] | TOPICAL_OINTMENT | Freq: Once | TRANSDERMAL | Status: AC
  Administered 2020-12-19: 0.5 [in_us] via TOPICAL
  Filled 2020-12-19: qty 1

## 2020-12-19 MED ORDER — ROSUVASTATIN CALCIUM 5 MG PO TABS
10.0000 mg | ORAL_TABLET | Freq: Every day | ORAL | Status: DC
  Administered 2020-12-19 – 2020-12-28 (×10): 10 mg via ORAL
  Filled 2020-12-19 (×10): qty 2

## 2020-12-19 MED ORDER — INSULIN DETEMIR 100 UNIT/ML ~~LOC~~ SOLN
20.0000 [IU] | Freq: Two times a day (BID) | SUBCUTANEOUS | Status: DC
  Administered 2020-12-19 – 2020-12-28 (×19): 20 [IU] via SUBCUTANEOUS
  Filled 2020-12-19 (×22): qty 0.2

## 2020-12-19 MED ORDER — INSULIN ASPART 100 UNIT/ML IJ SOLN
0.0000 [IU] | Freq: Three times a day (TID) | INTRAMUSCULAR | Status: DC
  Administered 2020-12-19 – 2020-12-20 (×3): 1 [IU] via SUBCUTANEOUS
  Administered 2020-12-20: 2 [IU] via SUBCUTANEOUS
  Administered 2020-12-21: 1 [IU] via SUBCUTANEOUS
  Administered 2020-12-21: 2 [IU] via SUBCUTANEOUS
  Administered 2020-12-22 – 2020-12-23 (×2): 1 [IU] via SUBCUTANEOUS

## 2020-12-19 MED ORDER — ALBUTEROL SULFATE (2.5 MG/3ML) 0.083% IN NEBU: 2.5000 mg | INHALATION_SOLUTION | RESPIRATORY_TRACT | Status: DC | PRN

## 2020-12-19 MED ORDER — ONDANSETRON HCL 4 MG/2ML IJ SOLN: 4.0000 mg | Freq: Four times a day (QID) | INTRAMUSCULAR | Status: DC | PRN

## 2020-12-19 MED ORDER — MORPHINE SULFATE (PF) 4 MG/ML IV SOLN
4.0000 mg | Freq: Once | INTRAVENOUS | Status: AC
  Administered 2020-12-19: 4 mg via INTRAVENOUS
  Filled 2020-12-19: qty 1

## 2020-12-19 MED ORDER — SODIUM CHLORIDE 0.9% FLUSH
3.0000 mL | Freq: Two times a day (BID) | INTRAVENOUS | Status: DC
  Administered 2020-12-19 – 2020-12-28 (×9): 3 mL via INTRAVENOUS

## 2020-12-19 MED ORDER — METOPROLOL SUCCINATE ER 100 MG PO TB24
100.0000 mg | ORAL_TABLET | Freq: Two times a day (BID) | ORAL | Status: DC
  Administered 2020-12-19 – 2020-12-22 (×6): 100 mg via ORAL
  Filled 2020-12-19 (×6): qty 1

## 2020-12-19 MED ORDER — GABAPENTIN 300 MG PO CAPS
600.0000 mg | ORAL_CAPSULE | Freq: Two times a day (BID) | ORAL | Status: DC
  Administered 2020-12-19 – 2020-12-28 (×19): 600 mg via ORAL
  Filled 2020-12-19 (×11): qty 2
  Filled 2020-12-19: qty 6
  Filled 2020-12-19 (×7): qty 2

## 2020-12-19 MED ORDER — MAGNESIUM OXIDE 400 MG PO CAPS: 400.0000 mg | ORAL_CAPSULE | Freq: Every day | ORAL | Status: DC

## 2020-12-19 MED ORDER — CYCLOSPORINE 0.05 % OP EMUL
1.0000 [drp] | Freq: Two times a day (BID) | OPHTHALMIC | Status: DC
  Administered 2020-12-19 – 2020-12-28 (×19): 1 [drp] via OPHTHALMIC
  Filled 2020-12-19 (×20): qty 1

## 2020-12-19 MED ORDER — APIXABAN 5 MG PO TABS
5.0000 mg | ORAL_TABLET | Freq: Two times a day (BID) | ORAL | Status: DC
  Administered 2020-12-19 – 2020-12-23 (×9): 5 mg via ORAL
  Filled 2020-12-19 (×9): qty 1

## 2020-12-19 MED ORDER — DULOXETINE HCL 60 MG PO CPEP
60.0000 mg | ORAL_CAPSULE | Freq: Every day | ORAL | Status: DC
  Administered 2020-12-19 – 2020-12-28 (×10): 60 mg via ORAL
  Filled 2020-12-19 (×10): qty 1

## 2020-12-19 MED ORDER — ACETAMINOPHEN 325 MG PO TABS
650.0000 mg | ORAL_TABLET | ORAL | Status: DC | PRN
  Administered 2020-12-23 – 2020-12-28 (×4): 650 mg via ORAL
  Filled 2020-12-19 (×5): qty 2

## 2020-12-19 MED ORDER — AMIODARONE HCL 200 MG PO TABS
200.0000 mg | ORAL_TABLET | Freq: Two times a day (BID) | ORAL | Status: DC
  Administered 2020-12-19 – 2020-12-28 (×19): 200 mg via ORAL
  Filled 2020-12-19 (×19): qty 1

## 2020-12-19 NOTE — ED Provider Notes (Signed)
[facility] EMERGENCY DEPARTMENT Provider Note   CSN: [id number] Arrival date & time: 12/19/20  0946     History Chief Complaint  Patient presents with  . Chest Pain  . Nausea    [name] is a 65 y.o. female presented for evaluation nausea and vomiting, chest pain.  Patient states few days ago she developed nausea, vomiting, abdominal pain.  Yesterday she started to feel acutely ill.  She states she feels "bad" and developed left-sided chest pain which has been persistent since.  She denies previous similar symptoms.  She reports nothing has improved her symptoms, nothing makes it worse.  She has a history of A. fib, per patient is permanent.  She is on blood thinner.  She denies recent fevers, new cough, urinary symptoms, normal bowel movements.  No recent change medications.  She was told that she needs surgery for her heart, but this has not yet been scheduled.  She follows with Dr. Harrell Gave from cardiology.  She continues to take torsemide as prescribed.  Additional history obtained from chart review.  Patient with a history of asthma, CHF COPD, diabetes, A. fib on blood thinners, diabetes hypertension, hyperlipidemia, sleep apnea.  I reviewed recent tumor ED that showed severe mitral regurg and tricuspid regurg.   HPI     Past Medical History:  Diagnosis Date  . Allergic rhinitis   . Arthritis   . Asthma   . Chest pain 12/27/2017  . Chronic diastolic CHF (congestive heart failure) ([place])   . COPD (chronic obstructive pulmonary disease) ([place])   . Depression   . DM (diabetes mellitus) ([place])   . DVT (deep vein thrombosis) in pregnancy   . Dysrhythmia    atrial fibrilation  . GERD (gastroesophageal reflux disease)   . Headache(784.0)   . HTN (hypertension)   . Hyperlipidemia   . Obesity   . Pneumonia 04/2018   RIGHT LOBE  . Shortness of breath   . Sleep apnea    compliant with CPAP    Patient Active Problem List   Diagnosis Date  Noted  . A-fib ([place]) 11/02/2020  . Chronic combined systolic and diastolic heart failure ([place]) 05/06/2020  . NICM (nonischemic cardiomyopathy) ([place]) 04/01/2020  . Family history of heart disease 04/01/2020  . Severe mitral regurgitation 04/01/2020  . Severe tricuspid regurgitation 04/01/2020  . Closed right ankle fracture 09/24/2019  . DKA, type 2 ([place]) 09/24/2019  . HHNC (hyperglycemic hyperosmolar nonketotic coma) ([place]) 02/21/2019  . Hypokalemia 02/21/2019  . Acute kidney injury superimposed on CKD ([place]) 02/21/2019  . Acute on chronic left systolic heart failure ([place]) 07/01/2018  . Congestive heart failure with left ventricular diastolic dysfunction, acute ([place]) 07/01/2018  . Right upper lobe pneumonia 05/14/2018  . Atrial fibrillation with RVR ([place]) 05/14/2018  . Chronic atrial fibrillation ([place]) 12/26/2017  . Diastolic dysfunction 76/81/1572  . Diabetes mellitus type 2 in obese ([place]) 12/26/2017  . Nausea vomiting and diarrhea   . Chest pain 02/26/2012  . OSA (obstructive sleep apnea) 09/21/2011  . Hypoxia 09/21/2011  . Diabetes mellitus ([place]) 02/04/2010  . Morbid obesity ([place]) 02/04/2010  . Atrial fibrillation ([place]) 02/04/2010  . Gastroparesis 02/04/2010  . Acute gastroenteritis 02/04/2010    Past Surgical History:  Procedure Laterality Date  . ABDOMINAL HYSTERECTOMY    . BUBBLE STUDY  11/26/2020   Procedure: BUBBLE STUDY;  Surgeon: Buford Dresser, MD;  Location: [place];  Service: Cardiovascular;;  . CARDIOVERSION N/A 05/06/2020   Procedure: CARDIOVERSION;  Surgeon: Harrell Gave,  Shawna Orleans, MD;  Location: [place];  Service: Cardiovascular;  Laterality: N/A;  . CARDIOVERSION N/A 11/04/2020   Procedure: CARDIOVERSION;  Surgeon: Lelon Perla, MD;  Location: [facility] ENDOSCOPY;  Service: Cardiovascular;  Laterality: N/A;  . CATARACT EXTRACTION, BILATERAL    . CHOLECYSTECTOMY    . COLONOSCOPY WITH PROPOFOL N/A 03/05/2015   Procedure: COLONOSCOPY WITH PROPOFOL;   Surgeon: Carol Ada, MD;  Location: [facility] ENDOSCOPY;  Service: Endoscopy;  Laterality: N/A;  . DIAGNOSTIC LAPAROSCOPY    . ingrown hallux Left   . KNEE SURGERY    . LEFT HEART CATH AND CORONARY ANGIOGRAPHY N/A 12/31/2017   Procedure: LEFT HEART CATH AND CORONARY ANGIOGRAPHY;  Surgeon: Charolette Forward, MD;  Location: [place] [facility];  Service: Cardiovascular;  Laterality: N/A;  . TEE WITHOUT CARDIOVERSION N/A 11/26/2020   Procedure: TRANSESOPHAGEAL ECHOCARDIOGRAM (TEE);  Surgeon: Buford Dresser, MD;  Location: [facility] ENDOSCOPY;  Service: Cardiovascular;  Laterality: N/A;     OB History   No obstetric history on file.     Family History  Problem Relation Age of Onset  . Cancer Mother   . Hypertension Mother   . Cancer Father   . CAD Other   . Hypertension Sister   . Hypertension Brother     Social History   Tobacco Use  . Smoking status: Never Smoker  . Smokeless tobacco: Never Used  Vaping Use  . Vaping Use: Never used  Substance Use Topics  . Alcohol use: No  . Drug use: No    Home Medications Prior to Admission medications   Medication Sig Start Date End Date Taking? Authorizing Provider  Accu-Chek Softclix Lancets lancets Use as instructed to check blood sugar three times daily. E11.69 04/21/20   Charlott Rakes, MD  albuterol (VENTOLIN HFA) 108 (90 Base) MCG/ACT inhaler Inhale 2 puffs into the lungs every 4 (four) hours as needed for wheezing or shortness of breath.  08/15/19   [provider]  Alcohol Swabs (B-D SINGLE USE SWABS REGULAR) PADS  02/19/19   [provider]  amiodarone (PACERONE) 200 MG tablet Take 1 tablet (200 mg total) by mouth 2 (two) times daily. 11/05/20   Baldwin Jamaica, PA-C  apixaban (ELIQUIS) 5 MG TABS tablet Take 1 tablet (5 mg total) by mouth 2 (two) times daily. 11/09/20   Buford Dresser, MD  aspirin-sod bicarb-citric acid (ALKA-SELTZER) 325 MG TBEF tablet Take 325 mg by mouth every 6 (six) hours as needed  (indigestion).    [provider]  Blood Glucose Monitoring Suppl (ACCU-CHEK GUIDE ME) w/Device KIT Use as instructed to check blood sugar three times daily. E11.69 04/21/20   Charlott Rakes, MD  Blood Glucose Monitoring Suppl (ACCU-CHEK GUIDE) w/Device KIT USE AS INSTRUCTED TO CHECK BLOOD SUGAR THREE TIMES DAILY. E11.69 04/21/20   [provider]  digoxin (LANOXIN) 0.125 MG tablet Take 0.125 mg by mouth daily.  11/04/19   [provider]  DULoxetine (CYMBALTA) 60 MG capsule Take 1 capsule (60 mg total) by mouth daily. 09/24/20   [name], NP  EPINEPHrine 0.3 mg/0.3 mL IJ SOAJ injection Inject 0.3 mg into the muscle as needed for anaphylaxis. Patient taking differently: Inject 0.3 mg into the muscle as needed for anaphylaxis. Never used 06/25/20   [name], NP  gabapentin (NEURONTIN) 300 MG capsule Take 2 capsules (600 mg total) by mouth 2 (two) times daily. 09/24/20   [name], NP  insulin aspart (NOVOLOG) 100 UNIT/ML injection Inject 12 Units into the  skin 3 (three) times daily before meals. For blood sugars 0-199 give 0 units of insulin, 201-250 give 4 units, 251-300 give 6 units, 301-350 give 8 units, 351-400 give 10 units,> 400 give 12 units and call M.D. Patient taking differently: Inject 0-12 Units into the skin 3 (three) times daily before meals. For blood sugars 0-199 give 0 units of insulin, 201-250 give 4 units, 251-300 give 6 units, 301-350 give 8 units, 351-400 give 10 units,> 400 give 12 units and call M.D. 06/24/20   [name], NP  LEVEMIR 100 UNIT/ML injection INJECT 0.4 MLS (40 UNITS TOTAL) INTO THE SKIN 2 (TWO) TIMES DAILY. 12/01/20   [name], NP  Magnesium Oxide 400 MG CAPS Take 1 capsule (400 mg total) by mouth daily. 11/05/20   Baldwin Jamaica, PA-C  metoprolol succinate (TOPROL-XL) 100 MG 24 hr tablet Take 100 mg by mouth in the morning and at bedtime. Take with or immediately following a meal.    [provider]  Multiple Vitamins-Minerals (MULTIVITAMIN WOMEN PO) Take 1 tablet by mouth daily.     [provider]  nitroGLYCERIN (NITROSTAT) 0.4 MG SL tablet Place 1 tablet (0.4 mg total) under the tongue every 5 (five) minutes x 3 doses as needed for chest pain. 01/08/14   Charolette Forward, MD  Potassium Chloride ER 20 MEQ TBCR Take 20 mEq by mouth daily. 11/05/20   Baldwin Jamaica, PA-C  RESTASIS 0.05 % ophthalmic emulsion Place 1 drop into both eyes 2 (two) times daily. 11/19/17   [provider]  rosuvastatin (CRESTOR) 10 MG tablet Take 1 tablet (10 mg total) by mouth daily. 09/24/20   [name], NP  torsemide 40 MG TABS Take 40 mg by mouth 2 (two) times daily. 11/22/20 02/20/21  Buford Dresser, MD  TRUE METRIX BLOOD GLUCOSE TEST test strip USE AS INSTRUCTED TO CHECK BLOOD SUGAR DAILY. 07/21/20   Charlott Rakes, MD  TRULICITY 1.5 YW/7.3XT SOPN INJECT 1.5 MG INTO THE SKIN ONCE A WEEK. 12/05/20   [name], NP    Allergies    Sulfa antibiotics  Review of Systems   Review of Systems  Respiratory: Positive for cough and shortness of breath.   Cardiovascular: Positive for chest pain.  Gastrointestinal: Positive for abdominal pain, nausea and vomiting.  All other systems reviewed and are negative.   Physical Exam Updated Vital Signs BP (!) 151/92   Pulse (!) 115   Temp 98.4 F (36.9 C) (Oral)   Resp (!) 25   SpO2 90%   Physical Exam Vitals and nursing note reviewed.  Constitutional:      General: She is not in acute distress.    Appearance: She is well-developed. She is obese.     Comments: nontoxic  HENT:     Head: Normocephalic and atraumatic.  Eyes:     Conjunctiva/sclera: Conjunctivae normal.     Pupils: Pupils are equal, round, and reactive to light.  Cardiovascular:     Rate and Rhythm: Tachycardia present. Rhythm irregular.     Pulses: Normal pulses.     Comments: Irregularly irregular between 90 and 110 Pulmonary:     Effort:  Pulmonary effort is normal. No respiratory distress.     Breath sounds: Rhonchi present. No wheezing.     Comments: Expiratory crackles.  Speaking full sentences.  Sats in the low 90s on room air Abdominal:     General: There is no distension.     Palpations: Abdomen is  soft. There is no mass.     Tenderness: There is no abdominal tenderness. There is no guarding or rebound.  Musculoskeletal:        General: Normal range of motion.     Cervical back: Normal range of motion and neck supple.  Skin:    General: Skin is warm and dry.     Capillary Refill: Capillary refill takes less than 2 seconds.  Neurological:     Mental Status: She is alert and oriented to person, place, and time.     ED Results / Procedures / Treatments   Labs (all labs ordered are listed, but only abnormal results are displayed) Labs Reviewed  COMPREHENSIVE METABOLIC PANEL - Abnormal; Notable for the following components:      Result Value   Glucose, Bld 164 (*)    Creatinine, Ser 1.68 (*)    Albumin 3.4 (*)    GFR, Estimated 34 (*)    All other components within normal limits  BRAIN NATRIURETIC PEPTIDE - Abnormal; Notable for the following components:   B Natriuretic Peptide 227.9 (*)    All other components within normal limits  RESP PANEL BY RT-PCR (FLU A&B, COVID) ARPGX2  CBC WITH DIFFERENTIAL/PLATELET  LIPASE, BLOOD  TROPONIN I (HIGH SENSITIVITY)  TROPONIN I (HIGH SENSITIVITY)    EKG EKG Interpretation  Date/Time:  Sunday Dec 19 2020 09:48:31 EDT Ventricular Rate:  96 PR Interval:    QRS Duration: 98 QT Interval:  410 QTC Calculation: 532 R Axis:   152 Text Interpretation: Atrial fibrillation Right axis deviation Low voltage, precordial leads Borderline T abnormalities, diffuse leads Prolonged QT interval Confirmed by Dene Gentry [phone]) on 12/19/2020 9:52:40 AM   Radiology DG Abdomen Acute W/Chest  Result Date: 12/19/2020 CLINICAL DATA:  Sudden onset central chest pain yesterday. Rule  out perforation. EXAM: DG ABDOMEN ACUTE WITH 1 VIEW CHEST COMPARISON:  Plain film of the abdomen dated 03/02/2020. Chest x-ray dated 03/02/2020. FINDINGS: Single-view of the chest: Stable cardiomegaly. Mild central pulmonary vascular congestion and interstitial prominence. No confluent opacity to suggest a consolidating pneumonia. No pleural effusion or pneumothorax is seen. Supine and upright views of the abdomen: No dilated large or small bowel loops. Scattered nonspecific air-fluid levels within the central abdomen. No evidence of free intraperitoneal air. No evidence of renal or ureteral calculi. Cholecystectomy clips in the RIGHT upper quadrant. Visualized osseous structures are unremarkable. IMPRESSION: 1. Cardiomegaly with central pulmonary vascular congestion and interstitial prominence suggesting mild CHF/volume overload. 2. No evidence of pneumonia. 3. Nonobstructive bowel gas pattern. Scattered nonspecific air-fluid levels within the central abdomen, perhaps underlying gastroenteritis. 4. No evidence of bowel perforation. Electronically Signed   By: Franki Cabot M.D.   On: 12/19/2020 11:55    Procedures Procedures   Medications Ordered in ED Medications  morphine 4 MG/ML injection 4 mg (4 mg Intravenous Given 12/19/20 1027)  furosemide (LASIX) injection 60 mg (60 mg Intravenous Given 12/19/20 1254)  nitroGLYCERIN (NITROGLYN) 2 % ointment 0.5 inch (0.5 inches Topical Given 12/19/20 1255)    ED Course  I have reviewed the triage vital signs and the nursing notes.  Pertinent labs & imaging results that were available during my care of the patient were reviewed by me and considered in my medical decision making (see chart for details).    MDM Rules/Calculators/A&P                          Patient presenting for evaluation of  chest pain, nausea, vomiting.  On exam, patient appears nontoxic.  She has a variable heart rate, and A. fib.  This is persisted.  She has crackles on her pulmonary exam,  concern for acute on chronic heart failure.  However she also has chest pain following nausea and vomiting, consider aspiration versus infection versus GERD/PUD versus perf vs MSK pain.  Will obtain labs, EKG, and acute abdomen series.  Labs interpreted by me, overall reassuring.  Troponin is negative.  BNP is elevated from baseline.  X-rays viewed and independently interpreted by me, shows increased haziness consistent with worsened heart failure.  No acute abnormality noted in the abdomen.  On reevaluation, patient continues to have chest pain.  Will give Lasix and nitro and reassess.  On reassessment, patient continues to have chest pain.  She was able to ambulate without hypoxia, however states she still feels poorly.  In the setting of worsening clinical and objective findings for heart failure, will plan for admission for diuresis and continued cardiac evaluation.  Discussed with family medicine, patient to be admitted.  Final Clinical Impression(s) / ED Diagnoses Final diagnoses:  Acute on chronic heart failure, unspecified heart failure type (HCC)  Atrial fibrillation, permanent (HCC)  Nausea and vomiting, intractability of vomiting not specified, unspecified vomiting type  Chest pain, unspecified type    Rx / DC Orders ED Discharge Orders    None       Franchot Heidelberg, PA-C 12/19/20 1418    Valarie Merino, MD 12/22/20 [phone]

## 2020-12-19 NOTE — ED Notes (Signed)
Patient transported to X-ray 

## 2020-12-19 NOTE — H&P (Addendum)
Family Medicine Teaching [facility] Admission History and Physical Service Pager: [phone]  Patient name: [name] Medical record number: [id number] Date of birth: 02/18/1956 Age: 65 y.o. Gender: female  Primary Care Provider: Grayce Sessions, NP Consultants: None Code Status: FULL Preferred Emergency Contact: sister, Rinaldo Ratel: [phone]  Chief Complaint: Chest pain, abdominal pain, N/V  Assessment and Plan: [name] is a 65 y.o. female presenting with abdominal pain, NV and left-sided chest pain and SOB since last night. 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   CHF Exacerbation Patient presents with left-sided chest pain and SOB since last night. ED work up notable for elevated BNP 227.9, flat troponin's 8>12. She does endorse left-sided chest pain but feel MI less likely given flat troponins, EKG without ST changes and nitro paste without relief. DG Abd with evidence of cardiomegaly and CHF/volume overload. Patient admits to taking her home medications as prescribed, states that she takes 40 mg of Torsemide in the AM and 20 mg at night. She sleeps on 2-3 pillows at night. Endorses some SOB with walking. Reports her dry weight is around 270lbs, she is 306 lbs today. She is s/p IV Lasix 60 mg (which would be equivalent to her home regimen).  Patient feels that she has responded with the IV Lasix but is unsure of how much because she thinks that the pure wick is not properly placed and so it has not measured any urine.  Patient would benefit from increased diuresis and management for CHF exacerbation.  She states that she is scheduled for open-heart surgery this month.  -Admit for observation med-tele, attending Dr. Deirdre Priest -Daily weights -Strict I/O  -PT/OT eval and treat -Additional 60 mg Lasix this PM and determine if she responds for future doses of Lasix 5/2 -Continue home  metoprolol-succinate 100 mg BID -Morning BMP; replete K+ as needed -If acutely worsens consult cardiology while inpatient but otherwise follow-up outpatient  Abdominal Pain, Nausea/Vomiting Reports having abdominal pain starting on Friday. She did not have nausea and vomiting until yesterday, reports over 10 episodes. She has only been able to eat some fruit since yesterday. Unsure if she has vomited blood but hgb appears stable and she has not vomited in ED which is reassuring. Given acute presentation and abdominal x-ray findings of non-specific air-fluid levels within central abdomen, suspect gastroenteritis causing these symptoms. Other etiologies to consider would include obstruction or appendicitis though not tender in RLQ and without leukocytosis. Normal lipase, not suggestive of pancreatitis. Could be related to CHF exacerbation. Reassuring that pain has improved and patient was requesting oral fluids. If pain worsens, can consider further imaging for evaluation of other acute causes.  -D/c Zofran given prolonged QT -Contact team if requires management for worsening nausea -Consider further imaging if pain worsens   Atrial Fibrillation Rate controlled. Home medications notable for amiodarone 200 mg BID, digoxin 0.125 mg daily, metoprolol succinate 100 mg BID, eliquis 5 mg BID -Continue home medications  T2DM: chronic, stable  Last A1c 9.3 on 11/02/20. Home medications include Levemir 40 units BID, Trulicity 1.5 mg qweekly.  -Levemir 20 mg BID; decreased given decreased PO intake recently. Can adjust as needed -CBG AC/qHS -sSSI  -Hold Trulicity while inpatient  -Not on ACE-I/ARB given elevated creatinine  -Continue rosuvastatin 10 mg daily   GERD: Chronic, stable  Home medication notable for alka-seltzer 325 mg. -Holding home medications for now, can restart as needed  Prolonged QT QTc 532.  Avoid QT prolonging medications.   Unintentional Weight Loss  Patient reports about 200  lbs weight loss since last fall. This is concerning given that patient does not report dietary changes or lifestyle changes besides prayer. She does have a family history of cancer.  -Ensure follow up outpatient; ensure appropriate cancer screening  -Consider further workup as necessary  FEN/GI: Renal/carb modified with 1500 mL fluid restriction Prophylaxis: Eliquis  Disposition: Med-tele  History of Present Illness:  [name] is a 65 y.o. female presenting with abdominal pain, nausea and vomiting since yesterday as well as left-sided chest pain worsening since last night.  Patient reports that she felt slightly short of breath and had a hard time sleeping last night. She began to get more concerned when she started to have left-sided chest pain. She states that her weight is around 270 lbs normally but at her last cardiology appointment she was 276 lbs. She reports about a 200 lb weight loss since last fall, states she used to be 600 lbs. She does admit to not eating as much as she normally does but specifically denies a diet or exercise. She states that she prayed for weight loss and she has a strong faith and it has worked. She does endorse a family history of breast cancer in her mother and niece. Also states that her father passed away from cancer, "it wrapped around all of his organs". She does endorse night sweats. She has a family history of CHF as well. She has felt that the swelling in her legs has actually improved. She takes her medications as prescribed. She thinks that she has urinated on herself since she received IV Lasix in the ED, feels the purewick is not placed correctly.   As far as her abdominal pain, she has had nausea and countless episodes of vomiting since yesterday. She states it was over 10 episodes. She has only been able to eat fruit since yesterday. No one else is sick at home. She denies diarrhea or constipation. No blood in her stools. She feels better now.  She is very thirsty and requesting oral fluids.   ED Course: IV Lasix 60 mg x1. Morphine 4 mg x1. 0.5 inch Nitroglycerin 2% ointment.   Review Of Systems: Per HPI with the following additions:   Review of Systems  Constitutional: Positive for chills and unexpected weight change. Negative for fever.  Respiratory: Positive for shortness of breath.   Gastrointestinal: Positive for abdominal pain. Negative for blood in stool, constipation, diarrhea, nausea and vomiting.  Genitourinary: Positive for dysuria.  Neurological: Positive for weakness.     Patient Active Problem List   Diagnosis Date Noted  . A-fib (HCC) 11/02/2020  . Chronic combined systolic and diastolic heart failure (HCC) 05/06/2020  . NICM (nonischemic cardiomyopathy) (HCC) 04/01/2020  . Family history of heart disease 04/01/2020  . Severe mitral regurgitation 04/01/2020  . Severe tricuspid regurgitation 04/01/2020  . Closed right ankle fracture 09/24/2019  . DKA, type 2 (HCC) 09/24/2019  . HHNC (hyperglycemic hyperosmolar nonketotic coma) (HCC) 02/21/2019  . Hypokalemia 02/21/2019  . Acute kidney injury superimposed on CKD (HCC) 02/21/2019  . Acute on chronic left systolic heart failure (HCC) 07/01/2018  . Congestive heart failure with left ventricular diastolic dysfunction, acute (HCC) 07/01/2018  . Right upper lobe pneumonia 05/14/2018  . Atrial fibrillation with RVR (HCC) 05/14/2018  . Chronic atrial fibrillation (HCC) 12/26/2017  . Diastolic dysfunction 12/26/2017  . Diabetes mellitus type 2 in obese (HCC) 12/26/2017  .  Nausea vomiting and diarrhea   . Chest pain 02/26/2012  . OSA (obstructive sleep apnea) 09/21/2011  . Hypoxia 09/21/2011  . Diabetes mellitus (HCC) 02/04/2010  . Morbid obesity (HCC) 02/04/2010  . Atrial fibrillation (HCC) 02/04/2010  . Gastroparesis 02/04/2010  . Acute gastroenteritis 02/04/2010    Past Medical History: Past Medical History:  Diagnosis Date  . Allergic rhinitis   .  Arthritis   . Asthma   . Chest pain 12/27/2017  . Chronic diastolic CHF (congestive heart failure) (HCC)   . COPD (chronic obstructive pulmonary disease) (HCC)   . Depression   . DM (diabetes mellitus) (HCC)   . DVT (deep vein thrombosis) in pregnancy   . Dysrhythmia    atrial fibrilation  . GERD (gastroesophageal reflux disease)   . Headache(784.0)   . HTN (hypertension)   . Hyperlipidemia   . Obesity   . Pneumonia 04/2018   RIGHT LOBE  . Shortness of breath   . Sleep apnea    compliant with CPAP    Past Surgical History: Past Surgical History:  Procedure Laterality Date  . ABDOMINAL HYSTERECTOMY    . BUBBLE STUDY  11/26/2020   Procedure: BUBBLE STUDY;  Surgeon: Jodelle Red, MD;  Location: [facility] ENDOSCOPY;  Service: Cardiovascular;;  . CARDIOVERSION N/A 05/06/2020   Procedure: CARDIOVERSION;  Surgeon: Jodelle Red, MD;  Location: [facility] ENDOSCOPY;  Service: Cardiovascular;  Laterality: N/A;  . CARDIOVERSION N/A 11/04/2020   Procedure: CARDIOVERSION;  Surgeon: Lewayne Bunting, MD;  Location: [facility] ENDOSCOPY;  Service: Cardiovascular;  Laterality: N/A;  . CATARACT EXTRACTION, BILATERAL    . CHOLECYSTECTOMY    . COLONOSCOPY WITH PROPOFOL N/A 03/05/2015   Procedure: COLONOSCOPY WITH PROPOFOL;  Surgeon: Jeani Hawking, MD;  Location: [facility] ENDOSCOPY;  Service: Endoscopy;  Laterality: N/A;  . DIAGNOSTIC LAPAROSCOPY    . ingrown hallux Left   . KNEE SURGERY    . LEFT HEART CATH AND CORONARY ANGIOGRAPHY N/A 12/31/2017   Procedure: LEFT HEART CATH AND CORONARY ANGIOGRAPHY;  Surgeon: Rinaldo Cloud, MD;  Location: [facility] INVASIVE CV LAB;  Service: Cardiovascular;  Laterality: N/A;  . TEE WITHOUT CARDIOVERSION N/A 11/26/2020   Procedure: TRANSESOPHAGEAL ECHOCARDIOGRAM (TEE);  Surgeon: Jodelle Red, MD;  Location: [facility] ENDOSCOPY;  Service: Cardiovascular;  Laterality: N/A;    Social History: Social History   Tobacco Use  . Smoking status: Never Smoker  . Smokeless tobacco:  Never Used  Vaping Use  . Vaping Use: Never used  Substance Use Topics  . Alcohol use: No  . Drug use: No   Please also refer to relevant sections of EMR.  Family History: Family History  Problem Relation Age of Onset  . Cancer Mother   . Hypertension Mother   . Cancer Father   . CAD Other   . Hypertension Sister   . Hypertension Brother     Allergies and Medications: Allergies  Allergen Reactions  . Sulfa Antibiotics Itching   Current Facility-Administered Medications on File Prior to Encounter  Medication Dose Route Frequency Provider Last Rate Last Admin  . nitroGLYCERIN (NITROSTAT) SL tablet 0.4 mg  0.4 mg Sublingual Q5 min PRN Jodelle Red, MD   0.4 mg at 11/22/20 1414   Current Outpatient Medications on File Prior to Encounter  Medication Sig Dispense Refill  . Accu-Chek Softclix Lancets lancets Use as instructed to check blood sugar three times daily. E11.69 100 each 12  . albuterol (VENTOLIN HFA) 108 (90 Base) MCG/ACT inhaler Inhale 2 puffs into the lungs every 4 (four) hours  as needed for wheezing or shortness of breath.     . Alcohol Swabs (B-D SINGLE USE SWABS REGULAR) PADS     . amiodarone (PACERONE) 200 MG tablet Take 1 tablet (200 mg total) by mouth 2 (two) times daily. 60 tablet 6  . apixaban (ELIQUIS) 5 MG TABS tablet Take 1 tablet (5 mg total) by mouth 2 (two) times daily. 180 tablet 1  . aspirin-sod bicarb-citric acid (ALKA-SELTZER) 325 MG TBEF tablet Take 325 mg by mouth every 6 (six) hours as needed (indigestion).    . Blood Glucose Monitoring Suppl (ACCU-CHEK GUIDE ME) w/Device KIT Use as instructed to check blood sugar three times daily. E11.69 1 kit 0  . Blood Glucose Monitoring Suppl (ACCU-CHEK GUIDE) w/Device KIT USE AS INSTRUCTED TO CHECK BLOOD SUGAR THREE TIMES DAILY. E11.69    . digoxin (LANOXIN) 0.125 MG tablet Take 0.125 mg by mouth daily.     . DULoxetine (CYMBALTA) 60 MG capsule Take 1 capsule (60 mg total) by mouth daily. 90 capsule 3   . EPINEPHrine 0.3 mg/0.3 mL IJ SOAJ injection Inject 0.3 mg into the muscle as needed for anaphylaxis. (Patient taking differently: Inject 0.3 mg into the muscle as needed for anaphylaxis. Never used) 1 each 1  . gabapentin (NEURONTIN) 300 MG capsule Take 2 capsules (600 mg total) by mouth 2 (two) times daily. 360 capsule 1  . insulin aspart (NOVOLOG) 100 UNIT/ML injection Inject 12 Units into the skin 3 (three) times daily before meals. For blood sugars 0-199 give 0 units of insulin, 201-250 give 4 units, 251-300 give 6 units, 301-350 give 8 units, 351-400 give 10 units,> 400 give 12 units and call M.D. (Patient taking differently: Inject 0-12 Units into the skin 3 (three) times daily before meals. For blood sugars 0-199 give 0 units of insulin, 201-250 give 4 units, 251-300 give 6 units, 301-350 give 8 units, 351-400 give 10 units,> 400 give 12 units and call M.D.) 10 mL 3  . LEVEMIR 100 UNIT/ML injection INJECT 0.4 MLS (40 UNITS TOTAL) INTO THE SKIN 2 (TWO) TIMES DAILY. 60 mL 1  . Magnesium Oxide 400 MG CAPS Take 1 capsule (400 mg total) by mouth daily. 30 capsule 6  . metoprolol succinate (TOPROL-XL) 100 MG 24 hr tablet Take 100 mg by mouth in the morning and at bedtime. Take with or immediately following a meal.    . Multiple Vitamins-Minerals (MULTIVITAMIN WOMEN PO) Take 1 tablet by mouth daily.     . nitroGLYCERIN (NITROSTAT) 0.4 MG SL tablet Place 1 tablet (0.4 mg total) under the tongue every 5 (five) minutes x 3 doses as needed for chest pain. 25 tablet 12  . Potassium Chloride ER 20 MEQ TBCR Take 20 mEq by mouth daily. 30 tablet 6  . RESTASIS 0.05 % ophthalmic emulsion Place 1 drop into both eyes 2 (two) times daily.  12  . rosuvastatin (CRESTOR) 10 MG tablet Take 1 tablet (10 mg total) by mouth daily. 90 tablet 1  . torsemide 40 MG TABS Take 40 mg by mouth 2 (two) times daily. 60 tablet 11  . TRUE METRIX BLOOD GLUCOSE TEST test strip USE AS INSTRUCTED TO CHECK BLOOD SUGAR DAILY. 100 strip  11  . TRULICITY 1.5 MG/0.5ML SOPN INJECT 1.5 MG INTO THE SKIN ONCE A WEEK. 0.5 mL 1    Objective: BP (!) 152/140   Pulse 96   Temp 98.4 F (36.9 C) (Oral)   Resp 15   SpO2 91%  Exam: General: Awake,  alert, no distress, pleasant Eyes: Non-icteric, EOMI Neck: supple, mild JVD appreciated to jaw line Cardiovascular: A-rib, regular rate, no murmur appreciated Respiratory: Crackles in b/l bases, without respiratory distress Gastrointestinal: central and LLQ pain on palpation, without rebound/guarding, soft, obese, non-distended MSK: Moving all extremities spontaneously  Derm: warm and dry  Neuro: Answers questions appropriately, speech clear without slurring, follows commands, without focal weakness Psych: Normal affect and mood  Labs and Imaging: CBC BMET  Recent Labs  Lab 12/19/20 1016  WBC 7.1  HGB 12.2  HCT 39.4  PLT 187   Recent Labs  Lab 12/19/20 1016  NA 141  K 3.5  CL 102  CO2 30  BUN 16  CREATININE 1.68*  GLUCOSE 164*  CALCIUM 9.8     EKG: Atrial fibrillation, rate 96, prolonged QT interval  Abdominal X-ray IMPRESSION: 1. Cardiomegaly with central pulmonary vascular congestion and interstitial prominence suggesting mild CHF/volume overload. 2. No evidence of pneumonia. 3. Nonobstructive bowel gas pattern. Scattered nonspecific air-fluid levels within the central abdomen, perhaps underlying gastroenteritis. 4. No evidence of bowel perforation.  Sabino Dick, DO 12/19/2020, 2:58 PM PGY-1, [facility] FPTS Intern pager: [phone], text pages welcome  FPTS Upper-Level Resident Addendum   I have independently interviewed and examined the patient. I have discussed the above with the original author and agree with their documentation. Please see also any attending notes.   Derrel Nip, MD PGY-2, [facility] 12/19/2020 6:24 PM  FPTS Service pager: [phone] (text pages welcome through [facility])

## 2020-12-19 NOTE — ED Notes (Signed)
Pt ambulated in the room . O2 remained in  92-93 RA with out experiencing SOB.

## 2020-12-19 NOTE — Hospital Course (Addendum)
[name] is a 65 y.o. female who presented with left-sided chest pain and SOB admitted for CHF exacerbation. 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   Acute Combined Systolic and Diastolic CHF Exacerbation  Work up in ED notable for BNP 227.9, flat trops 8>12, EKG with A-fib but without ST changes. Abd x-ray with cardiomegaly and CHF/volume overload. She was about 15 lbs increased from her dry weight of 270 lbs. Patient was started on diuresis with IV Lasix, up to 80 mg BID with adequate response***. Cardiology consult was placed during admission and recommended ***.   Severe MR  Atrial Fibrillation Patient with severe MR, scheduled for CV surgery this month. This is likely contributing to her persistent atrial fibrillation. She was continued on her home medications while inpatient.   Abdominal Pain  Nausea, Vomiting Patient presented with abdominal pain. Previously had nausea and vomiting prior to admission, likely from gastroenteritis. This improved during admission and she was able to tolerate PO during admission without recurrence or need for anti-emetics.   Type 2 DM  She was started on carb modified diet inpatient. Given dietary changes, she was started on half of her insulin dose inpatient. Blood sugars remained controlled*, she was d/c back on her home regimen.   Strong Family History of Cancer Patient reported about a 200 lb weight loss since last fall. She is on a GLP-1 but does admit to having a strong family history of cancer including breast cancer in her mother and niece and unknown cancer in her father.Through chart review, it does appear that her weight has been stable. Though given her family history, please ensure she is up to date on age-appropriate cancer screenings outpatient.   Follow Up Recommendations Please ensure patient is up to date on age appropriate cancer  screening- reports about 200 lbs weight loss since fall 2021.  Weight on d/c ***

## 2020-12-19 NOTE — ED Triage Notes (Signed)
Pt bib ems N/V since Thursday. Yesterday pt with sudden onset central chest pain, tender to palpation, worse with inspiration. Pain worsened this morning. Hx CHF, pt reports coughing up pink frothy sputum. Pt given 4mg  zofran, 324mg  asa and 1 sl Nitro with no relief.  HR 90's afib 170/100

## 2020-12-20 DIAGNOSIS — I4819 Other persistent atrial fibrillation: Secondary | ICD-10-CM | POA: Diagnosis not present

## 2020-12-20 DIAGNOSIS — I5043 Acute on chronic combined systolic (congestive) and diastolic (congestive) heart failure: Secondary | ICD-10-CM

## 2020-12-20 DIAGNOSIS — I509 Heart failure, unspecified: Secondary | ICD-10-CM | POA: Diagnosis not present

## 2020-12-20 DIAGNOSIS — I340 Nonrheumatic mitral (valve) insufficiency: Secondary | ICD-10-CM | POA: Diagnosis not present

## 2020-12-20 LAB — GLUCOSE, CAPILLARY
Glucose-Capillary: 139 mg/dL — ABNORMAL HIGH (ref 70–99)
Glucose-Capillary: 174 mg/dL — ABNORMAL HIGH (ref 70–99)
Glucose-Capillary: 138 mg/dL — ABNORMAL HIGH (ref 70–99)
Glucose-Capillary: 174 mg/dL — ABNORMAL HIGH (ref 70–99)

## 2020-12-20 LAB — BASIC METABOLIC PANEL
Anion gap: 10 (ref 5–15)
BUN: 15 mg/dL (ref 8–23)
CO2: 33 mmol/L — ABNORMAL HIGH (ref 22–32)
Calcium: 9.2 mg/dL (ref 8.9–10.3)
Chloride: 97 mmol/L — ABNORMAL LOW (ref 98–111)
Creatinine, Ser: 1.83 mg/dL — ABNORMAL HIGH (ref 0.44–1.00)
GFR, Estimated: 30 mL/min — ABNORMAL LOW (ref >60)
Glucose, Bld: 151 mg/dL — ABNORMAL HIGH (ref 70–99)
Potassium: 3.1 mmol/L — ABNORMAL LOW (ref 3.5–5.1)
Sodium: 140 mmol/L (ref 135–145)

## 2020-12-20 MED ORDER — FUROSEMIDE 10 MG/ML IJ SOLN
80.0000 mg | Freq: Once | INTRAMUSCULAR | Status: AC
  Administered 2020-12-20: 80 mg via INTRAVENOUS
  Filled 2020-12-20: qty 8

## 2020-12-20 MED ORDER — POTASSIUM CHLORIDE CRYS ER 20 MEQ PO TBCR
40.0000 meq | EXTENDED_RELEASE_TABLET | Freq: Every day | ORAL | Status: DC
  Filled 2020-12-20: qty 2

## 2020-12-20 MED ORDER — POTASSIUM CHLORIDE CRYS ER 20 MEQ PO TBCR
40.0000 meq | EXTENDED_RELEASE_TABLET | Freq: Two times a day (BID) | ORAL | Status: AC
  Administered 2020-12-20 (×2): 40 meq via ORAL
  Filled 2020-12-20 (×2): qty 2

## 2020-12-20 MED ORDER — FUROSEMIDE 10 MG/ML IJ SOLN
80.0000 mg | Freq: Two times a day (BID) | INTRAMUSCULAR | Status: DC
  Administered 2020-12-20 – 2020-12-22 (×4): 80 mg via INTRAVENOUS
  Filled 2020-12-20 (×4): qty 8

## 2020-12-20 NOTE — Progress Notes (Signed)
Family Medicine Teaching Service Daily Progress Note Intern Pager: [phone]  Patient name: [name] Medical record number: [id number] Date of birth: 08-17-1956 Age: 65 y.o. Gender: female  Primary Care Provider: [name], NP Consultants: None Code Status: FULL  Pt Overview and Major Events to Date:  5/1: Admitted  Assessment and Plan: [name] is a 65 y.o. female who presented with SOB and chest pain concerning for CHF exacerbation. 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   CHF Exacerbation Patient feels slightly improved though still having some left-sided chest pain, the same as yesterday. She is s/p 60 mg IV Lasix x2. She has responded well with 1.3L UOP since admission. Feel that exacerbation is likely related to medication non-adherence at home. There is discrepancy between her prescribed Torsemide dose and how she takes it, she only takes 20 mg in the evening which is likely contributing to her volume overloaded state. Weight on admission was actually 285 lbs (error in H&P), up about 10-15 lbs from dry weight. Weight stable this AM (0.2kg difference). Patient did report she was scheduled for open heart surgery (likely for CAD) early this month. Will discuss with cardiology since patient is in house.  -Consult cardiology  -Continue home metoprolol succinate 100 mg BID -Avoid ACE-I/ARB/ARNI given elevated creatinine -Consider SGLT-2 though she is already on GLP-1 -IV Lasix 80 mg x1 -40 mEq K+ x2 doses; repeat BMP tomorrow   Atrial Fibrillation  Digoxin level WNL at 0.9. QTc not actually reliable given atrial fibrillation.  -Continue home medications: Digoxin, metoprolol succinate, amiodarone, eliquis -Amiodarone appears like a loading dose, unclear. Will discuss with cardiology   Abdominal Pain  Nausea, Vomiting: Improving Patient still has some abdominal soreness but no longer  nauseous or vomiting. She was eating breakfast during my encounter. Did not require any anti-emetics overnight.   T2DM: chronic, stable CBG's 139-152. Has required only a single unit of SSI.  -CBG monitoring AC/qHS -Continue Insulin Detemir 20U BID -sSSI   HTN: chronic, stable BP's ranging 128-196/80-140 since admission. Most recently, they have improved to 646-803'O systolic. She is not on an ACE-I/ARB/ARNI given her elevated creatinine.  -Continue metoprolol succinate 100 mg BID  -Monitor BP's -Continue diuresis   GERD: chronic, stable -Can add back home medication of alka-seltzer as needed.   Unintentional Weight Loss Reports 200 lb loss since last fall. Unsure how long patient has been on GLP-1, this may have contributed to some weight loss. Given her strong family history of cancer it will be important to ensure she is up to date on age appropriate cancer screening outpatient.   FEN/GI: renal/carb modified with 1500 mL fluid restriction PPx: Lovenox   Status is: Observation  The patient remains OBS appropriate and will d/c before 2 midnights.  Dispo: The patient is from: Home              Anticipated d/c is to: Home              Patient currently is not medically stable to d/c.   Difficult to place patient No   Subjective:  Patient still complains of left-sided chest pain. It is intermittent and is the same pain that she was experiencing yesterday. She doesn't feel that she has peed much and notes not much urine this AM. There is discrepancy between her prescribed Torsemide dose 40 mg BID and her reported medication- she admits to 40 mg in the AM and 20  mg at night.   She has several questions about her current hospital diet and why she can and can't have certain foods. She states that the last time she was admitted the food was different.   Objective: Temp:  [97.5 F (36.4 C)-98.4 F (36.9 C)] 97.8 F (36.6 C) (05/02 0447) Pulse Rate:  [82-126] 103 (05/02  0447) Resp:  [15-28] 18 (05/02 0447) BP: (128-196)/(80-140) 134/95 (05/02 0447) SpO2:  [89 %-100 %] 97 % (05/02 0447) Weight:  [129.5 kg-129.7 kg] 129.5 kg (05/02 0501) Physical Exam: General: Awake, alert, in no distress Cardiovascular: Atrial fibrillation, rate controlled Respiratory: fine crackles in low bases, L>R Abdomen: obese, generalized pain on palpation in all quadrants without rebound/guarding Extremities: non-pitting edema  Laboratory: Recent Labs  Lab 12/19/20 1016  WBC 7.1  HGB 12.2  HCT 39.4  PLT 187   Recent Labs  Lab 12/19/20 1016  NA 141  K 3.5  CL 102  CO2 30  BUN 16  CREATININE 1.68*  CALCIUM 9.8  PROT 7.9  BILITOT 0.9  ALKPHOS 87  ALT 12  AST 19  GLUCOSE 164*   Imaging/Diagnostic Tests: None new.   Sharion Settler, DO 12/20/2020, 5:35 AM PGY-1, [facility] Intern pager: [phone], text pages welcome

## 2020-12-20 NOTE — Evaluation (Signed)
Occupational Therapy Evaluation Patient Details Name: [name] MRN: [id number] DOB: Feb 13, 1956 Today's Date: 12/20/2020    History of Present Illness Patient is a 65 y/o female who presents on 12/19/20 with SOB, chest pain, N/V and emesis. Admitted with CHF exacerbation. PMH includes HTN. DM2, A-fib, CHF, OSA, DVT, COPD.   Clinical Impression   Pt presents with decline in function and safety with ADLs and ADL mobility with impaired balance and endurance. PTA, pt lived at home with he daughter and grandchildren, was Ind with ADLs/selfcare, furniture walked in the home and  used cane outside. Pt reports occasional assist with socks. Pt currently requires min guard A with LB ADLs and mobility and would benefit from acute OT services to address impairments to maximize level of function and safety    Follow Up Recommendations  No OT follow up;Supervision - Intermittent    Equipment Recommendations  Tub/shower bench;Other (comment) (reacher)    Recommendations for Other Services       Precautions / Restrictions Precautions Precautions: Other (comment);Fall Precaution Comments: watch 02, hx of falls Restrictions Weight Bearing Restrictions: No      Mobility Bed Mobility               General bed mobility comments: pt in recliner upon arrival    Transfers Overall transfer level: Needs assistance Equipment used: None;Rolling walker (2 wheeled) Transfers: Sit to/from Stand Sit to Stand: Min guard              Balance Overall balance assessment: History of Falls;Needs assistance Sitting-balance support: Feet supported;No upper extremity supported Sitting balance-Leahy Scale: Good     Standing balance support: During functional activity;Bilateral upper extremity supported Standing balance-Leahy Scale: Fair                             ADL either performed or assessed with clinical judgement   ADL Overall ADL's : Needs  assistance/impaired Eating/Feeding: Independent;Sitting   Grooming: Wash/dry hands;Wash/dry face;Supervision/safety;Standing   Upper Body Bathing: Set up;Independent;Sitting   Lower Body Bathing: Min guard;Sitting/lateral leans;Sit to/from stand   Upper Body Dressing : Set up;Independent;Sitting   Lower Body Dressing: Min guard;Sitting/lateral leans;Sit to/from stand   Toilet Transfer: Min guard;Ambulation;Comfort height toilet;Grab bars   Toileting- Clothing Manipulation and Hygiene: Min guard;Sit to/from stand       Functional mobility during ADLs: Min guard       Vision Baseline Vision/History: Wears glasses Patient Visual Report: No change from baseline       Perception     Praxis      Pertinent Vitals/Pain Pain Assessment: Faces Faces Pain Scale: Hurts a little bit Pain Location: chest Pain Descriptors / Indicators: Aching Pain Intervention(s): Limited activity within patient's tolerance;Repositioned     Hand Dominance Right   Extremity/Trunk Assessment Upper Extremity Assessment Upper Extremity Assessment: Overall WFL for tasks assessed   Lower Extremity Assessment Lower Extremity Assessment: Defer to PT evaluation   Cervical / Trunk Assessment Cervical / Trunk Assessment: Normal   Communication Communication Communication: No difficulties   Cognition Arousal/Alertness: Awake/alert Behavior During Therapy: WFL for tasks assessed/performed Overall Cognitive Status: Within Functional Limits for tasks assessed                                     General Comments       Exercises     Shoulder Instructions  Home Living Family/patient expects to be discharged to:: Private residence Living Arrangements: Children Available Help at Discharge: Family Type of Home: Apartment Home Access: Stairs to enter [employer] of Steps: 1 + 1+ down then a landing then 2 steps up Entrance Stairs-Rails: Left Home Layout: One  level         Bathroom Toilet: Handicapped height     Home Equipment: [employer] - single point;Walker - 4 wheels;Shower seat;Wheelchair - manual          Prior Functioning/Environment Level of Independence: Independent with assistive device(s)        Comments: Independent for household mobility sometimes furniture walking, uses [facility] for community ambulation. Walks short distances only due to SOB. Does not drive. Does some IADLs, does own ADLs. Hx of falls, wears 02 as needed.        OT Problem List: Impaired balance (sitting and/or standing);Decreased activity tolerance;Decreased knowledge of use of DME or AE      OT Treatment/Interventions: Self-care/ADL training;Patient/family education;Therapeutic activities;Energy conservation;DME and/or AE instruction    OT Goals(Current goals can be found in the care plan section) Acute Rehab OT Goals Patient Stated Goal: get fluid off and go home OT Goal Formulation: With patient Time For Goal Achievement: 01/03/21 Potential to Achieve Goals: Good ADL Goals Pt Will Perform Lower Body Bathing: with supervision;with set-up;with modified independence Pt Will Perform Lower Body Dressing: with supervision;with set-up;with modified independence Pt Will Transfer to Toilet: with supervision;with modified independence;ambulating Pt Will Perform Toileting - Clothing Manipulation and hygiene: with supervision;with modified independence;sit to/from stand Pt Will Perform Tub/Shower Transfer: with supervision;tub bench;3 in 1  OT Frequency: Min 2X/week   Barriers to D/C:            Co-evaluation              AM-PAC OT "6 Clicks" Daily Activity     Outcome Measure Help from another person eating meals?: None Help from another person taking care of personal grooming?: None Help from another person toileting, which includes using toliet, bedpan, or urinal?: A Little Help from another person bathing (including washing, rinsing, drying)?: A  Little Help from another person to put on and taking off regular upper body clothing?: None Help from another person to put on and taking off regular lower body clothing?: A Little 6 Click Score: 21   End of Session    Activity Tolerance: Patient tolerated treatment well Patient left: in chair;with call bell/phone within reach;with chair alarm set  OT Visit Diagnosis: Unsteadiness on feet (R26.81);History of falling (Z91.81);Pain Pain - part of body:  (chest)                Time: 1014-1040 OT Time Calculation (min): 26 min Charges:  OT General Charges $OT Visit: 1 Visit OT Evaluation $OT Eval Moderate Complexity: 1 Mod OT Treatments $Self Care/Home Management : 8-22 mins    Britt Bottom 12/20/2020, 2:12 PM

## 2020-12-20 NOTE — Consult Note (Signed)
Cardiology Consultation:   Patient ID: [name] MRN: [id number]; DOB: 09/11/1955  Admit date: 12/19/2020 Date of Consult: 12/20/2020  PCP:  [name], NP   [place]  Cardiologist:  Buford Dresser, MD  Advanced Practice Provider:  No care team member to display Electrophysiologist:  None [id number]    Patient Profile:   [name] is a 65 y.o. 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 Erin Hearing.  History of Present Illness:   [name] is a 65 year old female with the history detailed above who is followed by Dr. Harrell Gave as an out-patient. She has a known history of HF with mildly reduced LVEF of 45-50% with cath in 2019 with mild, non-obstructive CAD. Also with history of difficult to control Afib with failed tikosyn load now on amiodarone, dig, metop and apixaban for [facility]. Saw Dr. Harrell Gave in clinic on 11/22/20 where she was experiencing worsening SOB, weight gain, LE edema and chest pressure. She declined ER visit at that time. She was changed from lasix to torsemide for diuresis. Also planned for CV surgery evaluation for severe MR later this month.  On the evening prior to admission, the patient developed acute worsening of SOB as well as left sided chest pain prompting her to go to the ER. In the ER, BNP 227, trop negative x2, Cr 1.68. Wt 285lbs from baseline around 270lbs. CXR with pulmonary vascular congestion. She was given lasix $RemoveBef'60mg'ObPCxCbmjE$  yesterday and $RemoveBefor'80mg'nJhmrHdZIYUc$  today. Cardiology is now consulted for ongoing management of HF and atrial fibrillation.  Currently, the patient states her shortness of breath is improving. Reports good response to lasix dosing (although I/Os even for the day). No current chest pain. Remains in Afib with  rates as high as 120-130s but currently in 90-100s.    Past Medical History:  Diagnosis Date  . Allergic rhinitis   . Arthritis   . Asthma   . Chest pain 12/27/2017  . Chronic diastolic CHF (congestive heart failure) ([place])   . COPD (chronic obstructive pulmonary disease) ([place])   . Depression   . DM (diabetes mellitus) ([place])   . DVT (deep vein thrombosis) in pregnancy   . Dysrhythmia    atrial fibrilation  . GERD (gastroesophageal reflux disease)   . Headache(784.0)   . HTN (hypertension)   . Hyperlipidemia   . Obesity   . Pneumonia 04/2018   RIGHT LOBE  . Shortness of breath   . Sleep apnea    compliant with CPAP    Past Surgical History:  Procedure Laterality Date  . ABDOMINAL HYSTERECTOMY    . BUBBLE STUDY  11/26/2020   Procedure: BUBBLE STUDY;  Surgeon: Buford Dresser, MD;  Location: [place];  Service: Cardiovascular;;  . CARDIOVERSION N/A 05/06/2020   Procedure: CARDIOVERSION;  Surgeon: Buford Dresser, MD;  Location: [facility] ENDOSCOPY;  Service: Cardiovascular;  Laterality: N/A;  . CARDIOVERSION N/A 11/04/2020   Procedure: CARDIOVERSION;  Surgeon: Lelon Perla, MD;  Location: [facility] ENDOSCOPY;  Service: Cardiovascular;  Laterality: N/A;  . CATARACT EXTRACTION, BILATERAL    . CHOLECYSTECTOMY    . COLONOSCOPY WITH PROPOFOL N/A 03/05/2015   Procedure: COLONOSCOPY WITH PROPOFOL;  Surgeon: Carol Ada, MD;  Location: [facility] ENDOSCOPY;  Service: Endoscopy;  Laterality: N/A;  . DIAGNOSTIC LAPAROSCOPY    . ingrown hallux Left   .  KNEE SURGERY    . LEFT HEART CATH AND CORONARY ANGIOGRAPHY N/A 12/31/2017   Procedure: LEFT HEART CATH AND CORONARY ANGIOGRAPHY;  Surgeon: Charolette Forward, MD;  Location: [place] CV LAB;  Service: Cardiovascular;  Laterality: N/A;  . TEE WITHOUT CARDIOVERSION N/A 11/26/2020   Procedure: TRANSESOPHAGEAL ECHOCARDIOGRAM (TEE);  Surgeon: Buford Dresser, MD;  Location: [facility] ENDOSCOPY;  Service: Cardiovascular;  Laterality: N/A;      Home Medications:  Prior to Admission medications   Medication Sig Start Date End Date Taking? Authorizing Provider  Accu-Chek Softclix Lancets lancets Use as instructed to check blood sugar three times daily. E11.69 04/21/20  Yes Newlin, Charlane Ferretti, MD  albuterol (VENTOLIN HFA) 108 (90 Base) MCG/ACT inhaler Inhale 2 puffs into the lungs every 4 (four) hours as needed for wheezing or shortness of breath.  08/15/19  Yes [provider]  Alcohol Swabs (B-D SINGLE USE SWABS REGULAR) PADS  02/19/19  Yes [provider]  amiodarone (PACERONE) 200 MG tablet Take 1 tablet (200 mg total) by mouth 2 (two) times daily. 11/05/20  Yes Baldwin Jamaica, PA-C  apixaban (ELIQUIS) 5 MG TABS tablet Take 1 tablet (5 mg total) by mouth 2 (two) times daily. 11/09/20  Yes Buford Dresser, MD  aspirin-sod bicarb-citric acid (ALKA-SELTZER) 325 MG TBEF tablet Take 325 mg by mouth every 6 (six) hours as needed (indigestion).   Yes [provider]  Blood Glucose Monitoring Suppl (ACCU-CHEK GUIDE ME) w/Device KIT Use as instructed to check blood sugar three times daily. E11.69 04/21/20  Yes Charlott Rakes, MD  Blood Glucose Monitoring Suppl (ACCU-CHEK GUIDE) w/Device KIT USE AS INSTRUCTED TO CHECK BLOOD SUGAR THREE TIMES DAILY. E11.69 04/21/20  Yes [provider]  digoxin (LANOXIN) 0.125 MG tablet Take 0.125 mg by mouth daily.  11/04/19  Yes [provider]  DULoxetine (CYMBALTA) 60 MG capsule Take 1 capsule (60 mg total) by mouth daily. 09/24/20  Yes [name], NP  gabapentin (NEURONTIN) 300 MG capsule Take 2 capsules (600 mg total) by mouth 2 (two) times daily. 09/24/20  Yes [name], NP  insulin aspart (NOVOLOG) 100 UNIT/ML injection Inject 12 Units into the skin 3 (three) times daily before meals. For blood sugars 0-199 give 0 units of insulin, 201-250 give 4 units, 251-300 give 6 units, 301-350 give 8 units, 351-400 give 10 units,> 400 give 12 units and call  M.D. Patient taking differently: Inject 5 Units into the skin 3 (three) times daily before meals. For blood sugars 0-199 give 0 units of insulin, 201-250 give 4 units, 251-300 give 6 units, 301-350 give 8 units, 351-400 give 7. 06/24/20  Yes Edwards, Michelle P, NP  LEVEMIR 100 UNIT/ML injection INJECT 0.4 MLS (40 UNITS TOTAL) INTO THE SKIN 2 (TWO) TIMES DAILY. Patient taking differently: Inject 50 Units into the skin 2 (two) times daily. 12/01/20  Yes [name], NP  Multiple Vitamins-Minerals (MULTIVITAMIN WOMEN PO) Take 1 tablet by mouth daily.    Yes [provider]  nitroGLYCERIN (NITROSTAT) 0.4 MG SL tablet Place 1 tablet (0.4 mg total) under the tongue every 5 (five) minutes x 3 doses as needed for chest pain. 01/08/14  Yes Charolette Forward, MD  TRUE METRIX BLOOD GLUCOSE TEST test strip USE AS INSTRUCTED TO CHECK BLOOD SUGAR DAILY. 07/21/20  Yes Newlin, Charlane Ferretti, MD  TRULICITY 1.5 NF/6.2ZH SOPN INJECT 1.5 MG INTO THE SKIN ONCE A WEEK. 12/05/20  Yes [name], NP  EPINEPHrine 0.3 mg/0.3 mL IJ SOAJ injection Inject  0.3 mg into the muscle as needed for anaphylaxis. Patient taking differently: Inject 0.3 mg into the muscle as needed for anaphylaxis. Never used 06/25/20   [name], NP  metoprolol succinate (TOPROL-XL) 100 MG 24 hr tablet Take 100 mg by mouth in the morning and at bedtime. Take with or immediately following a meal.    [provider]  Potassium Chloride ER 20 MEQ TBCR Take 20 mEq by mouth daily. 11/05/20   Baldwin Jamaica, PA-C  RESTASIS 0.05 % ophthalmic emulsion Place 1 drop into both eyes 2 (two) times daily. 11/19/17   [provider]  rosuvastatin (CRESTOR) 10 MG tablet Take 1 tablet (10 mg total) by mouth daily. 09/24/20   [name], NP  torsemide 40 MG TABS Take 40 mg by mouth 2 (two) times daily. 11/22/20 02/20/21  Buford Dresser, MD    Inpatient Medications: Scheduled Meds: . amiodarone  200 mg Oral BID  .  apixaban  5 mg Oral BID  . cycloSPORINE  1 drop Both Eyes BID  . digoxin  0.125 mg Oral Daily  . DULoxetine  60 mg Oral Daily  . gabapentin  600 mg Oral BID  . insulin aspart  0-9 Units Subcutaneous TID WC  . insulin detemir  20 Units Subcutaneous BID  . metoprolol succinate  100 mg Oral BID  . potassium chloride  40 mEq Oral BID  . rosuvastatin  10 mg Oral Daily  . sodium chloride flush  3 mL Intravenous Q12H   Continuous Infusions: . sodium chloride     PRN Meds: sodium chloride, acetaminophen, albuterol, sodium chloride flush  Allergies:    Allergies  Allergen Reactions  . Sulfa Antibiotics Itching    Social History:   Social History   Socioeconomic History  . Marital status: Widowed    Spouse name: Not on file  . Number of children: Not on file  . Years of education: Not on file  . Highest education level: Not on file  Occupational History  . Not on file  Tobacco Use  . Smoking status: Never Smoker  . Smokeless tobacco: Never Used  Vaping Use  . Vaping Use: Never used  Substance and Sexual Activity  . Alcohol use: No  . Drug use: No  . Sexual activity: Not on file  Other Topics Concern  . Not on file  Social History Narrative   Pt lives in [place] with spouse.  2 grown children.   Previously worked in [employer] at [employer].  Now on disability   Social Determinants of Health   Financial Resource Strain: Not on file  Food Insecurity: Not on file  Transportation Needs: Not on file  Physical Activity: Not on file  Stress: Not on file  Social Connections: Not on file  Intimate Partner Violence: Not on file    Family History:    Family History  Problem Relation Age of Onset  . Cancer Mother   . Hypertension Mother   . Cancer Father   . CAD Other   . Hypertension Sister   . Hypertension Brother      ROS:  Please see the history of present illness.  Review of Systems  Constitutional: Positive for malaise/fatigue. Negative for fever.  HENT:  Positive for congestion.   Eyes: Negative for redness.  Respiratory: Positive for cough and shortness of breath.   Cardiovascular: Positive for chest pain, palpitations, orthopnea and leg swelling.  Gastrointestinal: Positive for nausea and vomiting. Negative for blood in  stool and melena.  Genitourinary: Negative for hematuria.  Musculoskeletal: Positive for myalgias.  Neurological: Positive for dizziness. Negative for loss of consciousness.  Endo/Heme/Allergies: Negative for polydipsia.  Psychiatric/Behavioral: Negative for substance abuse.      Physical Exam/Data:   Vitals:   12/20/20 0447 12/20/20 0501 12/20/20 0811 12/20/20 1650  BP: (!) 134/95  (!) 136/92 129/81  Pulse: (!) 103  (!) 108 (!) 101  Resp: $Remo'18  18 18  'BVuer$ Temp: 97.8 F (36.6 C)  98.9 F (37.2 C) 97.7 F (36.5 C)  TempSrc: Oral  Oral Oral  SpO2: 97%  96% 98%  Weight:  129.5 kg    Height:        Intake/Output Summary (Last 24 hours) at 12/20/2020 1705 Last data filed at 12/20/2020 1331 Gross per 24 hour  Intake 1203 ml  Output 1700 ml  Net -497 ml   Last 3 Weights 12/20/2020 12/19/2020 11/22/2020  Weight (lbs) 285 lb 6.4 oz 285 lb 15 oz 307 lb  Weight (kg) 129.457 kg 129.7 kg 139.254 kg  Some encounter information is confidential and restricted. Go to Review Flowsheets activity to see all data.     Body mass index is 44.7 kg/m.  General:  Comfortable, sitting up in bed HEENT: normal Neck: JVD difficult to assess due to body habitus Vascular: No carotid bruits; FA pulses 2+ bilaterally without bruits  Cardiac:  Irregular, 2/6 systolic murmur best heard at the apex Lungs:  Crackles bilaterally  Abd: soft, nontender, no hepatomegaly  Ext: trace bilateral edema (non-pitting) Musculoskeletal:  No deformities, BUE and BLE strength normal and equal Skin: warm and dry  Neuro:  CNs 2-12 intact, no focal abnormalities noted Psych:  Normal affect   EKG:  The EKG was personally reviewed and demonstrates:  Afib with low  voltage in precordial leads Telemetry:  Telemetry was personally reviewed and demonstrates:  Afib with HR 90-120s  Relevant CV Studies: TEE 11/26/20: IMPRESSIONS  1. Left ventricular ejection fraction, by estimation, is 45 to 50%. The  left ventricle has mildly decreased function. The left ventricle  demonstrates global hypokinesis.  2. Right ventricular systolic function is moderately reduced. The right  ventricular size is moderately enlarged.  3. Left atrial size was severely dilated. No left atrial/left atrial  appendage thrombus was detected.  4. Right atrial size was severely dilated.  5. MR more appreciable early in the study when blood pressure was normal.  MR jet appears less later in the study, likely due to reduction in blood  pressure. Posterior leaflet appears mildly restricted, with override of  anterior leaflet. Systolic flow  reversal seen in right sided pulmonary veins.. The mitral valve is  abnormal. Severe mitral valve regurgitation. The mean mitral valve  gradient is 2.0 mmHg.  6. Tricuspid valve regurgitation is severe.  7. The aortic valve is tricuspid. Aortic valve regurgitation is not  visualized. No aortic stenosis is present.   Conclusion(s)/Recommendation(s): Severe mitral regurgitation (PV systolic  flow reversal) likely due to restricted posterior leaflet and overriding  anterior leaflet. Severe TR.   FINDINGS  Left Ventricle: Left ventricular ejection fraction, by estimation, is 45  to 50%. The left ventricle has mildly decreased function. The left  ventricle demonstrates global hypokinesis. The left ventricular internal  cavity size was normal in size.   Right Ventricle: The right ventricular size is moderately enlarged. No  increase in right ventricular wall thickness. Right ventricular systolic  function is moderately reduced.   Left Atrium: Left atrial size was  severely dilated. No left atrial/left  atrial appendage thrombus was  detected.   Right Atrium: Right atrial size was severely dilated.   Pericardium: There is no evidence of pericardial effusion.   Mitral Valve: MR more appreciable early in the study when blood pressure  was normal. MR jet appears less later in the study, likely due to  reduction in blood pressure. Posterior leaflet appears mildly restricted,  with override of anterior leaflet.  Systolic flow reversal seen in right sided pulmonary veins. The mitral  valve is abnormal. There is mild thickening of the mitral valve  leaflet(s). There is mild calcification of the mitral valve leaflet(s).  Mild mitral annular calcification. Severe  mitral valve regurgitation. MV peak gradient, 5.6 mmHg. The mean mitral  valve gradient is 2.0 mmHg.   Tricuspid Valve: The tricuspid valve is normal in structure. Tricuspid  valve regurgitation is severe.   Aortic Valve: The aortic valve is tricuspid. Aortic valve regurgitation is  not visualized. No aortic stenosis is present.   Pulmonic Valve: The pulmonic valve was grossly normal. Pulmonic valve  regurgitation is mild.   Aorta: The aortic root and ascending aorta are structurally normal, with  no evidence of dilitation.   IAS/Shunts: No atrial level shunt detected by color flow Doppler. Agitated  saline contrast was given intravenously to evaluate for intracardiac  shunting.   Echo 07/09/20 1. Left ventricular ejection fraction, by estimation, is 45 to 50%. The  left ventricle has mildly decreased function. The left ventricle  demonstrates global hypokinesis. Left ventricular diastolic function could  not be evaluated.  2. Right ventricular systolic function was not well visualized. The right  ventricular size is moderately enlarged. There is normal pulmonary artery  systolic pressure.  3. Left atrial size was mildly dilated.  4. Right atrial size was moderately dilated.  5. Eccentric MR jet; appears mild-moderate on parasternal views, but on   apical views (see images 52, 66, 72) extends into distal atrium and may  have Coanda effect. Incompletely visualized and PV not sampled, suspect  moderate to severe MR.. The mitral  valve is rheumatic. Moderate to severe mitral valve regurgitation.  6. Tricuspid valve regurgitation is severe.  7. The aortic valve is tricuspid. Aortic valve regurgitation is not  visualized. No aortic stenosis is present.  8. The inferior vena cava is normal in size with <50% respiratory  variability, suggesting right atrial pressure of 8 mmHg.   Echo 07/02/2018 - Left ventricle: The cavity size was normal. Systolic function was  mildly reduced. The estimated ejection fraction was in the range  of 45% to 50%. There is moderate hypokinesis of the  entireanteroseptal myocardium.  - Aortic valve: There was mild regurgitation.  - Mitral valve: Mildly dilated, calcified annulus. There was severe  regurgitation.  - Left atrium: The atrium was severely dilated.  - Right ventricle: The cavity size was mildly dilated. Wall  thickness was normal. Systolic function was mildly to moderately  reduced.  - Right atrium: The atrium was severely dilated.  - Tricuspid valve: There was severe regurgitation.  - Pulmonary arteries: Systolic pressure was mildly increased. PA  peak pressure: 31 mm Hg (S).   Cath 12/31/2017  Prox LAD lesion is 10% stenosed.  Dist LM to Ost LAD lesion is 10% stenosed.  Ost Cx lesion is 15% stenosed.  The left ventricular systolic function is normal.  LV end diastolic pressure is normal.   Laboratory Data:  High Sensitivity Troponin:   Recent Labs  Lab  12/19/20 1016 12/19/20 1216  TROPONINIHS 8 12     Chemistry Recent Labs  Lab 12/19/20 1016 12/20/20 0743  NA 141 140  K 3.5 3.1*  CL 102 97*  CO2 30 33*  GLUCOSE 164* 151*  BUN 16 15  CREATININE 1.68* 1.83*  CALCIUM 9.8 9.2  GFRNONAA 34* 30*  ANIONGAP 9 10    Recent Labs  Lab 12/19/20 1016   PROT 7.9  ALBUMIN 3.4*  AST 19  ALT 12  ALKPHOS 87  BILITOT 0.9   Hematology Recent Labs  Lab 12/19/20 1016  WBC 7.1  RBC 4.28  HGB 12.2  HCT 39.4  MCV 92.1  MCH 28.5  MCHC 31.0  RDW 14.3  PLT 187   BNP Recent Labs  Lab 12/19/20 1016  BNP 227.9*    DDimer No results for input(s): DDIMER in the last 168 hours.   Radiology/Studies:  DG Abdomen Acute W/Chest  Result Date: 12/19/2020 CLINICAL DATA:  Sudden onset central chest pain yesterday. Rule out perforation. EXAM: DG ABDOMEN ACUTE WITH 1 VIEW CHEST COMPARISON:  Plain film of the abdomen dated 03/02/2020. Chest x-ray dated 03/02/2020. FINDINGS: Single-view of the chest: Stable cardiomegaly. Mild central pulmonary vascular congestion and interstitial prominence. No confluent opacity to suggest a consolidating pneumonia. No pleural effusion or pneumothorax is seen. Supine and upright views of the abdomen: No dilated large or small bowel loops. Scattered nonspecific air-fluid levels within the central abdomen. No evidence of free intraperitoneal air. No evidence of renal or ureteral calculi. Cholecystectomy clips in the RIGHT upper quadrant. Visualized osseous structures are unremarkable. IMPRESSION: 1. Cardiomegaly with central pulmonary vascular congestion and interstitial prominence suggesting mild CHF/volume overload. 2. No evidence of pneumonia. 3. Nonobstructive bowel gas pattern. Scattered nonspecific air-fluid levels within the central abdomen, perhaps underlying gastroenteritis. 4. No evidence of bowel perforation. Electronically Signed   By: Franki Cabot M.D.   On: 12/19/2020 11:55     Assessment and Plan:   #Acute on chronic combined systolic and diastolic HF: Patient presented with worsening SOB, weight gain and LE edema with BNP 227 and pulmonary edema on CXR consistent with acute on chronic HF exacerbation TTE in 06/2020 with LVEF 45-50%, global HK, moderate RV enlargement, mild LAE, moderate RAE,  moderate-to-severe MR, severe TR. Currently with NYHA class III symptoms.  -Diurese with lasix 80mg  IV BID -Continue metop 100mg  XL BID -Add spiro/SGLT-2i as able pending renal function -Cath 2019 without significant CAD -Monitor I/Os and daily weights -Low Na diet  #Persistent Atrial Fibrillation: CHADs-vasc 4-5 (gender, HTN, HF, DM, and vascular disease (only mild on cath)). Difficult to maintain sinus and has failed tikosyn load in the past (brief conversion to NSR but flipped back into Afib). Likely severe MR contributing with difficulty maintaining rhythm. -Continue eliquis -Continue amiodarone 200mg  BID; can change to gtt if needed for additional rate control--would not decrease dosing now given rapid rates; can down-titrate as out-patient -Continue digoxin; level within range at 0.9 -Continue metop 100mg  BID -Likely MAZE procedure if plans for MVR  #Severe MR #Severe TR: -TEE with confirmed severe MR; awaiting CV surgery evaluation as out-patient -Diuresis as above  #HLD: -Continue crestor 10mg  daily  #DMII: On insulin. -Management per primary team   Risk Assessment/Risk Scores:        New York Heart Association (NYHA) Functional Class NYHA Class III  CHA2DS2-VASc Score = 5  This indicates a 7.2% annual risk of stroke. The patient's score is based upon: CHF History: Yes HTN History:  Yes Diabetes History: Yes Stroke History: No Vascular Disease History: Yes Age Score: 0 Gender Score: 1        For questions or updates, please contact [place] Please consult [email or web address] for contact info under    Signed, Freada Bergeron, MD  12/20/2020 5:05 PM

## 2020-12-20 NOTE — Evaluation (Signed)
Physical Therapy Evaluation Patient Details Name: [name] MRN: [id number] DOB: 03-06-1956 Today's Date: 12/20/2020   History of Present Illness  Patient is a 65 y/o female who presents on 12/19/20 with SOB, chest pain, N/V and emesis. Admitted with CHF exacerbation. PMH includes HTN. DM2, A-fib, CHF, OSA, DVT, COPD.  Clinical Impression  Patient presents with pain, generalized weakness, impaired balance, dyspnea on exertion, decreased activity tolerance and impaired mobility s/p above. Pt reports she lives with her daughter and grandchildren and does her own ADLs with increased time/effort. Furniture walks at baseline and uses [facility] for community ambulation. Reports hx of falls. Today, pt tolerated short distance ambulation with Min guard-Min A for balance/safety. 1 LOB noted at sink. Sp02 ranged from 89-94% on RA. HR up to 143 bpm A-fib max with activity with 2-3/4 DOE. Encouraged OOB to chair and walking to bathroom as tolerated. Will follow acutely to maximize independence and mobility prior to return home.    Follow Up Recommendations Home health PT;Supervision - Intermittent    Equipment Recommendations  None recommended by PT    Recommendations for Other Services       Precautions / Restrictions Precautions Precautions: Other (comment);Fall Precaution Comments: watch 02, hx of falls Restrictions Weight Bearing Restrictions: No      Mobility  Bed Mobility Overal bed mobility: Needs Assistance Bed Mobility: Supine to Sit     Supine to sit: Min guard;HOB elevated     General bed mobility comments: Increased effort and use of rail to get to EOB, mild dizziness reported.    Transfers Overall transfer level: Needs assistance Equipment used: None Transfers: Sit to/from Stand Sit to Stand: Min guard         General transfer comment: Min guard for safety. Stood from [employer], from toilet x1, slow to rise and transferred to chair post  ambulation.  Ambulation/Gait Ambulation/Gait assistance: Min assist Gait Distance (Feet): 16 Feet (+ 22) Assistive device: None Gait Pattern/deviations: Step-through pattern;Decreased stride length;Wide base of support Gait velocity: decreased Gait velocity interpretation: <1.8 ft/sec, indicate of risk for recurrent falls General Gait Details: Slow, unsteady gait reaching for furniture for support; wide BoS. 2-3/4 DOE. Sp02 ranged from 89-94% on RA. HR up to 143 bpm A-fib max with activity.  Stairs            Wheelchair Mobility    Modified Rankin (Stroke Patients Only)       Balance Overall balance assessment: History of Falls;Needs assistance Sitting-balance support: Feet supported;No upper extremity supported Sitting balance-Leahy Scale: Good     Standing balance support: During functional activity Standing balance-Leahy Scale: Fair Standing balance comment: Able to stand at sinka nd wash hands, LOB posteriorly and to the left needing Min A to recover.                             Pertinent Vitals/Pain Pain Assessment: Faces Faces Pain Scale: Hurts little more Pain Location: chest Pain Descriptors / Indicators: Aching Pain Intervention(s): Monitored during session;Repositioned;Limited activity within patient's tolerance    Home Living Family/patient expects to be discharged to:: Private residence Living Arrangements: Children (daughter and grandchildren) Available Help at Discharge: Family Type of Home: Apartment Home Access: Stairs to enter Entrance Stairs-Rails: Left Entrance Stairs-Number of Steps: 1 + 1+ down then a landing then 2 steps up Home Layout: One level Home Equipment: Cane - single point;Walker - 4 wheels;Shower seat;Wheelchair - manual Additional Comments: wears 02 as  needed.    Prior Function Level of Independence: Independent with assistive device(s)         Comments: Independent for household mobility sometimes furniture  walking, uses [facility] for community ambulation. Walks short distances only due to SOB. Does not drive. Does some IADLs, does own ADLs. Hx of falls, wears 02 as needed.     Hand Dominance   Dominant Hand: Right    Extremity/Trunk Assessment   Upper Extremity Assessment Upper Extremity Assessment: Defer to OT evaluation (neuropathy in bil hands making fine motor tasks difficult)    Lower Extremity Assessment Lower Extremity Assessment: RLE deficits/detail;LLE deficits/detail;Generalized weakness RLE Deficits / Details: Edema present and tender to palpation RLE Sensation: history of peripheral neuropathy;decreased light touch RLE Coordination: decreased fine motor;decreased gross motor LLE Deficits / Details: Edema present and tender to palpation LLE Sensation: history of peripheral neuropathy;decreased light touch LLE Coordination: decreased fine motor;decreased gross motor       Communication   Communication: No difficulties  Cognition Arousal/Alertness: Awake/alert Behavior During Therapy: WFL for tasks assessed/performed Overall Cognitive Status: Within Functional Limits for tasks assessed                                 General Comments: for basic mobility tasks.      General Comments General comments (skin integrity, edema, etc.): Sp02 ranged from 89-94% on RA. HR up to 143 bpm A-fib max with activity. Edema present BLEs and into feet.    Exercises     Assessment/Plan    PT Assessment Patient needs continued PT services  PT Problem List Decreased strength;Decreased mobility;Pain;Impaired sensation;Decreased balance;Decreased activity tolerance;Cardiopulmonary status limiting activity;Decreased coordination       PT Treatment Interventions Therapeutic exercise;Patient/family education;Therapeutic activities;Functional mobility training;Balance training;Gait training;Stair training;DME instruction    PT Goals (Current goals can be found in the Care Plan  section)  Acute Rehab PT Goals Patient Stated Goal: to get this fluid off me PT Goal Formulation: With patient Time For Goal Achievement: 01/03/21 Potential to Achieve Goals: Good    Frequency Min 3X/week   Barriers to discharge        Co-evaluation               AM-PAC PT "6 Clicks" Mobility  Outcome Measure Help needed turning from your back to your side while in a flat bed without using bedrails?: A Little Help needed moving from lying on your back to sitting on the side of a flat bed without using bedrails?: A Little Help needed moving to and from a bed to a chair (including a wheelchair)?: A Little Help needed standing up from a chair using your arms (e.g., wheelchair or bedside chair)?: A Little Help needed to walk in hospital room?: A Little Help needed climbing 3-5 steps with a railing? : A Lot 6 Click Score: 17    End of Session Equipment Utilized During Treatment: Gait belt Activity Tolerance: Patient limited by fatigue;Other (comment) (DOE) Patient left: in chair;with call bell/phone within reach;with chair alarm set Nurse Communication: Mobility status PT Visit Diagnosis: Pain;Muscle weakness (generalized) (M62.81);Unsteadiness on feet (R26.81);Difficulty in walking, not elsewhere classified (R26.2) Pain - part of body:  (chest)    Time: 0263-7858 PT Time Calculation (min) (ACUTE ONLY): 25 min   Charges:   PT Evaluation $PT Eval Moderate Complexity: 1 Mod PT Treatments $Therapeutic Activity: 8-22 mins        Marisa Severin, PT, DPT  Acute Rehabilitation Services Pager [phone] Office [phone]      Lacie Draft 12/20/2020, 9:58 AM

## 2020-12-20 NOTE — TOC Progression Note (Addendum)
Transition of Care [facility]) - Progression Note    Patient Details  Name: [name] MRN: [id number] Date of Birth: 19-Nov-1955  Transition of Care [facility] CM/SW Contact  Zenon Mayo, RN Phone Number: 12/20/2020, 5:07 PM  Clinical Narrative:    NCM spoke with patient at the bedside, NCM offered choice for [facility] services she states she has had [facility] in the past and would to stay with them.  NCM informed her that they changed their name to [facility], she states ok she will go with [facility] for [place].  NCM made referrral to [facility] with [facility] . She states she can take the referral . Soc will begin 24 to 48 hrs post dc. Need orders for HHRN,HHPT.        Expected Discharge Plan and Services                                                 Social Determinants of Health (SDOH) Interventions    Readmission Risk Interventions Readmission Risk Prevention Plan 03/08/2020  Transportation Screening Complete  Medication Review [employer] photographer) Complete  PCP or Specialist appointment within 3-5 days of discharge Complete  [place]RI or [place] Complete  SW Recovery Care/Counseling Consult Complete  [place] Not Applicable  Some recent data might be hidden

## 2020-12-20 NOTE — Progress Notes (Addendum)
Progress Note  Patient Name: [name] Date of Encounter: 12/20/2020  [facility] Cardiologist: Buford Dresser, MD   Subjective   Feeling better. Still has labored breathing with ambulation, but this is improving. Working with PT today.  Net negative 421mL  Inpatient Medications    Scheduled Meds: . amiodarone  200 mg Oral BID  . apixaban  5 mg Oral BID  . cycloSPORINE  1 drop Both Eyes BID  . digoxin  0.125 mg Oral Daily  . DULoxetine  60 mg Oral Daily  . furosemide  80 mg Intravenous BID  . gabapentin  600 mg Oral BID  . insulin aspart  0-9 Units Subcutaneous TID WC  . insulin detemir  20 Units Subcutaneous BID  . metoprolol succinate  100 mg Oral BID  . potassium chloride  40 mEq Oral BID  . [START ON 12/21/2020] potassium chloride  40 mEq Oral Daily  . rosuvastatin  10 mg Oral Daily  . sodium chloride flush  3 mL Intravenous Q12H   Continuous Infusions: . sodium chloride     PRN Meds: sodium chloride, acetaminophen, albuterol, sodium chloride flush   Vital Signs    Vitals:   12/20/20 0447 12/20/20 0501 12/20/20 0811 12/20/20 1650  BP: (!) 134/95  (!) 136/92 129/81  Pulse: (!) 103  (!) 108 (!) 101  Resp: 18  18 18   Temp: 97.8 F (36.6 C)  98.9 F (37.2 C) 97.7 F (36.5 C)  TempSrc: Oral  Oral Oral  SpO2: 97%  96% 98%  Weight:  129.5 kg    Height:        Intake/Output Summary (Last 24 hours) at 12/20/2020 1850 Last data filed at 12/20/2020 1700 Gross per 24 hour  Intake 1523 ml  Output 1700 ml  Net -177 ml   Last 3 Weights 12/20/2020 12/19/2020 11/22/2020  Weight (lbs) 285 lb 6.4 oz 285 lb 15 oz 307 lb  Weight (kg) 129.457 kg 129.7 kg 139.254 kg  Some encounter information is confidential and restricted. Go to Review Flowsheets activity to see all data.      Telemetry    Afib; rate controlled - Personally Reviewed  ECG    No new tracing - Personally Reviewed  Physical Exam   GEN: No acute distress.  Sitting up in bed Neck: JVD  difficult to assess due to body habitus Cardiac: Irregular, 2/6 systolic murmur best heard at apex Respiratory: Clear to auscultation bilaterally. GI: Soft, nontender, non-distended  MS: 1+ non-pitting edema to the mid-shins. Neuro:  Nonfocal  Psych: Normal affect   Labs    High Sensitivity Troponin:   Recent Labs  Lab 12/19/20 1016 12/19/20 1216  TROPONINIHS 8 12      Chemistry Recent Labs  Lab 12/19/20 1016 12/20/20 0743  NA 141 140  K 3.5 3.1*  CL 102 97*  CO2 30 33*  GLUCOSE 164* 151*  BUN 16 15  CREATININE 1.68* 1.83*  CALCIUM 9.8 9.2  PROT 7.9  --   ALBUMIN 3.4*  --   AST 19  --   ALT 12  --   ALKPHOS 87  --   BILITOT 0.9  --   GFRNONAA 34* 30*  ANIONGAP 9 10     Hematology Recent Labs  Lab 12/19/20 1016  WBC 7.1  RBC 4.28  HGB 12.2  HCT 39.4  MCV 92.1  MCH 28.5  MCHC 31.0  RDW 14.3  PLT 187    BNP Recent Labs  Lab 12/19/20 1016  BNP 227.9*     DDimer No results for input(s): DDIMER in the last 168 hours.   Radiology    DG Abdomen Acute W/Chest  Result Date: 12/19/2020 CLINICAL DATA:  Sudden onset central chest pain yesterday. Rule out perforation. EXAM: DG ABDOMEN ACUTE WITH 1 VIEW CHEST COMPARISON:  Plain film of the abdomen dated 03/02/2020. Chest x-ray dated 03/02/2020. FINDINGS: Single-view of the chest: Stable cardiomegaly. Mild central pulmonary vascular congestion and interstitial prominence. No confluent opacity to suggest a consolidating pneumonia. No pleural effusion or pneumothorax is seen. Supine and upright views of the abdomen: No dilated large or small bowel loops. Scattered nonspecific air-fluid levels within the central abdomen. No evidence of free intraperitoneal air. No evidence of renal or ureteral calculi. Cholecystectomy clips in the RIGHT upper quadrant. Visualized osseous structures are unremarkable. IMPRESSION: 1. Cardiomegaly with central pulmonary vascular congestion and interstitial prominence suggesting mild  CHF/volume overload. 2. No evidence of pneumonia. 3. Nonobstructive bowel gas pattern. Scattered nonspecific air-fluid levels within the central abdomen, perhaps underlying gastroenteritis. 4. No evidence of bowel perforation. Electronically Signed   By: Franki Cabot M.D.   On: 12/19/2020 11:55    Cardiac Studies   TEE 11/26/20: IMPRESSIONS  1. Left ventricular ejection fraction, by estimation, is 45 to 50%. The  left ventricle has mildly decreased function. The left ventricle  demonstrates global hypokinesis.  2. Right ventricular systolic function is moderately reduced. The right  ventricular size is moderately enlarged.  3. Left atrial size was severely dilated. No left atrial/left atrial  appendage thrombus was detected.  4. Right atrial size was severely dilated.  5. MR more appreciable early in the study when blood pressure was normal.  MR jet appears less later in the study, likely due to reduction in blood  pressure. Posterior leaflet appears mildly restricted, with override of  anterior leaflet. Systolic flow  reversal seen in right sided pulmonary veins.. The mitral valve is  abnormal. Severe mitral valve regurgitation. The mean mitral valve  gradient is 2.0 mmHg.  6. Tricuspid valve regurgitation is severe.  7. The aortic valve is tricuspid. Aortic valve regurgitation is not  visualized. No aortic stenosis is present.   Conclusion(s)/Recommendation(s): Severe mitral regurgitation (PV systolic  flow reversal) likely due to restricted posterior leaflet and overriding  anterior leaflet. Severe TR.   FINDINGS  Left Ventricle: Left ventricular ejection fraction, by estimation, is 45  to 50%. The left ventricle has mildly decreased function. The left  ventricle demonstrates global hypokinesis. The left ventricular internal  cavity size was normal in size.   Right Ventricle: The right ventricular size is moderately enlarged. No  increase in right ventricular wall  thickness. Right ventricular systolic  function is moderately reduced.   Left Atrium: Left atrial size was severely dilated. No left atrial/left  atrial appendage thrombus was detected.   Right Atrium: Right atrial size was severely dilated.   Pericardium: There is no evidence of pericardial effusion.   Mitral Valve: MR more appreciable early in the study when blood pressure  was normal. MR jet appears less later in the study, likely due to  reduction in blood pressure. Posterior leaflet appears mildly restricted,  with override of anterior leaflet.  Systolic flow reversal seen in right sided pulmonary veins. The mitral  valve is abnormal. There is mild thickening of the mitral valve  leaflet(s). There is mild calcification of the mitral valve leaflet(s).  Mild mitral annular calcification. Severe  mitral valve regurgitation. MV peak gradient, 5.6  mmHg. The mean mitral  valve gradient is 2.0 mmHg.   Tricuspid Valve: The tricuspid valve is normal in structure. Tricuspid  valve regurgitation is severe.   Aortic Valve: The aortic valve is tricuspid. Aortic valve regurgitation is  not visualized. No aortic stenosis is present.   Pulmonic Valve: The pulmonic valve was grossly normal. Pulmonic valve  regurgitation is mild.   Aorta: The aortic root and ascending aorta are structurally normal, with  no evidence of dilitation.   IAS/Shunts: No atrial level shunt detected by color flow Doppler. Agitated  saline contrast was given intravenously to evaluate for intracardiac  shunting.   Echo 07/09/20 1. Left ventricular ejection fraction, by estimation, is 45 to 50%. The  left ventricle has mildly decreased function. The left ventricle  demonstrates global hypokinesis. Left ventricular diastolic function could  not be evaluated.  2. Right ventricular systolic function was not well visualized. The right  ventricular size is moderately enlarged. There is normal pulmonary artery   systolic pressure.  3. Left atrial size was mildly dilated.  4. Right atrial size was moderately dilated.  5. Eccentric MR jet; appears mild-moderate on parasternal views, but on  apical views (see images 52, 66, 72) extends into distal atrium and may  have Coanda effect. Incompletely visualized and PV not sampled, suspect  moderate to severe MR.. The mitral  valve is rheumatic. Moderate to severe mitral valve regurgitation.  6. Tricuspid valve regurgitation is severe.  7. The aortic valve is tricuspid. Aortic valve regurgitation is not  visualized. No aortic stenosis is present.  8. The inferior vena cava is normal in size with <50% respiratory  variability, suggesting right atrial pressure of 8 mmHg.   Echo 07/02/2018 - Left ventricle: The cavity size was normal. Systolic function was  mildly reduced. The estimated ejection fraction was in the range  of 45% to 50%. There is moderate hypokinesis of the  entireanteroseptal myocardium.  - Aortic valve: There was mild regurgitation.  - Mitral valve: Mildly dilated, calcified annulus. There was severe  regurgitation.  - Left atrium: The atrium was severely dilated.  - Right ventricle: The cavity size was mildly dilated. Wall  thickness was normal. Systolic function was mildly to moderately  reduced.  - Right atrium: The atrium was severely dilated.  - Tricuspid valve: There was severe regurgitation.  - Pulmonary arteries: Systolic pressure was mildly increased. PA  peak pressure: 31 mm Hg (S).   Cath 12/31/2017  Prox LAD lesion is 10% stenosed.  Dist LM to Ost LAD lesion is 10% stenosed.  Ost Cx lesion is 15% stenosed.  The left ventricular systolic function is normal.  LV end diastolic pressure is normal.   Patient Profile     65 y.o. female with a hx of chronic diastolic and systolic HF with mildly reduced LVEF (45-50%), severe MR, atrial fibrillation on eliquis, HTN, and DMII who presented with  worsening SOB, weight gain and LE edema found to have elevated BNP and pulmonary edema on chest xray consistent with acute on chronic diastolic HF exacerbation.  Assessment & Plan    #Acute on chronic combined systolic and diastolic HF: Patient presented with worsening SOB, weight gain and LE edema with BNP 227 and pulmonary edema on CXR consistent with acute on chronic HF exacerbation TTE in 06/2020 with LVEF 45-50%, global HK, moderate RV enlargement, mild LAE, moderate RAE, moderate-to-severe MR, severe TR. Currently with NYHA class III symptoms.  -Diurese with lasix 80mg  IV BID -Continue metop 100mg   XL BID -Add spiro/SGLT-2i as able pending renal function -Cath 2019 without significant CAD -Monitor I/Os and daily weights -Low Na diet  #Persistent Atrial Fibrillation: CHADs-vasc 4-5 (gender, HTN, HF, DM, and vascular disease (only mild on cath)). Difficult to maintain sinus and has failed tikosyn load in the past (brief conversion to NSR but flipped back into Afib). Likely severe MR contributing with difficulty maintaining rhythm. -Continue eliquis -Continue amiodarone 200mg  BID-- will down-titrate as out-patient  -Continue digoxin; level within range at 0.9 -Continue metop 100mg  BID -Likely MAZE procedure if plans for MVR  #Severe MR #Severe TR: -TEE with confirmed severe MR; awaiting CV surgery evaluation as out-patient -Diuresis as above  #HLD: -Continue crestor 10mg  daily  #DMII: On insulin. -Management per primary team      For questions or updates, please contact [facility] Please consult [email or web address] for contact info under        Signed, Freada Bergeron, MD  12/20/2020, 6:50 PM

## 2020-12-20 NOTE — Care Management Obs Status (Signed)
[facility] NOTIFICATION   Patient Details  Name: [name] MRN: [id number] Date of Birth: 1955-09-14   Medicare Observation Status Notification Given:  Yes    Zenon Mayo, RN 12/20/2020, 5:04 PM

## 2020-12-21 ENCOUNTER — Ambulatory Visit: Payer: Medicare Other | Admitting: Podiatry

## 2020-12-21 DIAGNOSIS — Z9049 Acquired absence of other specified parts of digestive tract: Secondary | ICD-10-CM | POA: Diagnosis not present

## 2020-12-21 DIAGNOSIS — Z90710 Acquired absence of both cervix and uterus: Secondary | ICD-10-CM | POA: Diagnosis not present

## 2020-12-21 DIAGNOSIS — Z7901 Long term (current) use of anticoagulants: Secondary | ICD-10-CM | POA: Diagnosis not present

## 2020-12-21 DIAGNOSIS — J9811 Atelectasis: Secondary | ICD-10-CM | POA: Diagnosis not present

## 2020-12-21 DIAGNOSIS — E1122 Type 2 diabetes mellitus with diabetic chronic kidney disease: Secondary | ICD-10-CM | POA: Diagnosis present

## 2020-12-21 DIAGNOSIS — Z9842 Cataract extraction status, left eye: Secondary | ICD-10-CM | POA: Diagnosis not present

## 2020-12-21 DIAGNOSIS — I5023 Acute on chronic systolic (congestive) heart failure: Secondary | ICD-10-CM

## 2020-12-21 DIAGNOSIS — I5033 Acute on chronic diastolic (congestive) heart failure: Secondary | ICD-10-CM | POA: Diagnosis not present

## 2020-12-21 DIAGNOSIS — I081 Rheumatic disorders of both mitral and tricuspid valves: Secondary | ICD-10-CM | POA: Diagnosis present

## 2020-12-21 DIAGNOSIS — E871 Hypo-osmolality and hyponatremia: Secondary | ICD-10-CM | POA: Diagnosis not present

## 2020-12-21 DIAGNOSIS — Z9841 Cataract extraction status, right eye: Secondary | ICD-10-CM | POA: Diagnosis not present

## 2020-12-21 DIAGNOSIS — I071 Rheumatic tricuspid insufficiency: Secondary | ICD-10-CM | POA: Diagnosis not present

## 2020-12-21 DIAGNOSIS — N1832 Chronic kidney disease, stage 3b: Secondary | ICD-10-CM | POA: Diagnosis present

## 2020-12-21 DIAGNOSIS — Z6841 Body Mass Index (BMI) 40.0 and over, adult: Secondary | ICD-10-CM | POA: Diagnosis not present

## 2020-12-21 DIAGNOSIS — K08109 Complete loss of teeth, unspecified cause, unspecified class: Secondary | ICD-10-CM | POA: Diagnosis not present

## 2020-12-21 DIAGNOSIS — K0510 Chronic gingivitis, plaque induced: Secondary | ICD-10-CM | POA: Diagnosis not present

## 2020-12-21 DIAGNOSIS — I509 Heart failure, unspecified: Secondary | ICD-10-CM | POA: Diagnosis present

## 2020-12-21 DIAGNOSIS — Z8249 Family history of ischemic heart disease and other diseases of the circulatory system: Secondary | ICD-10-CM | POA: Diagnosis not present

## 2020-12-21 DIAGNOSIS — I4892 Unspecified atrial flutter: Secondary | ICD-10-CM | POA: Diagnosis not present

## 2020-12-21 DIAGNOSIS — Z01818 Encounter for other preprocedural examination: Secondary | ICD-10-CM | POA: Diagnosis not present

## 2020-12-21 DIAGNOSIS — N179 Acute kidney failure, unspecified: Secondary | ICD-10-CM | POA: Diagnosis not present

## 2020-12-21 DIAGNOSIS — D62 Acute posthemorrhagic anemia: Secondary | ICD-10-CM | POA: Diagnosis not present

## 2020-12-21 DIAGNOSIS — Z794 Long term (current) use of insulin: Secondary | ICD-10-CM | POA: Diagnosis not present

## 2020-12-21 DIAGNOSIS — J449 Chronic obstructive pulmonary disease, unspecified: Secondary | ICD-10-CM | POA: Diagnosis present

## 2020-12-21 DIAGNOSIS — Y92009 Unspecified place in unspecified non-institutional (private) residence as the place of occurrence of the external cause: Secondary | ICD-10-CM | POA: Diagnosis not present

## 2020-12-21 DIAGNOSIS — I340 Nonrheumatic mitral (valve) insufficiency: Secondary | ICD-10-CM | POA: Diagnosis not present

## 2020-12-21 DIAGNOSIS — I361 Nonrheumatic tricuspid (valve) insufficiency: Secondary | ICD-10-CM | POA: Diagnosis not present

## 2020-12-21 DIAGNOSIS — Z9889 Other specified postprocedural states: Secondary | ICD-10-CM | POA: Diagnosis not present

## 2020-12-21 DIAGNOSIS — N170 Acute kidney failure with tubular necrosis: Secondary | ICD-10-CM | POA: Diagnosis not present

## 2020-12-21 DIAGNOSIS — I5043 Acute on chronic combined systolic (congestive) and diastolic (congestive) heart failure: Secondary | ICD-10-CM | POA: Diagnosis present

## 2020-12-21 DIAGNOSIS — I130 Hypertensive heart and chronic kidney disease with heart failure and stage 1 through stage 4 chronic kidney disease, or unspecified chronic kidney disease: Secondary | ICD-10-CM | POA: Diagnosis present

## 2020-12-21 DIAGNOSIS — Z20822 Contact with and (suspected) exposure to covid-19: Secondary | ICD-10-CM | POA: Diagnosis present

## 2020-12-21 DIAGNOSIS — I700 Atherosclerosis of aorta: Secondary | ICD-10-CM | POA: Diagnosis not present

## 2020-12-21 DIAGNOSIS — I4821 Permanent atrial fibrillation: Secondary | ICD-10-CM | POA: Diagnosis present

## 2020-12-21 DIAGNOSIS — Z01810 Encounter for preprocedural cardiovascular examination: Secondary | ICD-10-CM | POA: Diagnosis not present

## 2020-12-21 DIAGNOSIS — I4819 Other persistent atrial fibrillation: Secondary | ICD-10-CM | POA: Diagnosis not present

## 2020-12-21 DIAGNOSIS — Z882 Allergy status to sulfonamides status: Secondary | ICD-10-CM | POA: Diagnosis not present

## 2020-12-21 DIAGNOSIS — Z7982 Long term (current) use of aspirin: Secondary | ICD-10-CM | POA: Diagnosis not present

## 2020-12-21 DIAGNOSIS — Z79899 Other long term (current) drug therapy: Secondary | ICD-10-CM | POA: Diagnosis not present

## 2020-12-21 HISTORY — DX: Acute on chronic systolic (congestive) heart failure: I50.23

## 2020-12-21 LAB — BASIC METABOLIC PANEL WITH GFR
Anion gap: 10 (ref 5–15)
BUN: 22 mg/dL (ref 8–23)
CO2: 28 mmol/L (ref 22–32)
Calcium: 9 mg/dL (ref 8.9–10.3)
Chloride: 99 mmol/L (ref 98–111)
Creatinine, Ser: 1.86 mg/dL — ABNORMAL HIGH (ref 0.44–1.00)
GFR, Estimated: 30 mL/min — ABNORMAL LOW
Glucose, Bld: 106 mg/dL — ABNORMAL HIGH (ref 70–99)
Potassium: 3.9 mmol/L (ref 3.5–5.1)
Sodium: 137 mmol/L (ref 135–145)

## 2020-12-21 LAB — GLUCOSE, CAPILLARY
Glucose-Capillary: 150 mg/dL — ABNORMAL HIGH (ref 70–99)
Glucose-Capillary: 152 mg/dL — ABNORMAL HIGH (ref 70–99)
Glucose-Capillary: 124 mg/dL — ABNORMAL HIGH (ref 70–99)
Glucose-Capillary: 158 mg/dL — ABNORMAL HIGH (ref 70–99)

## 2020-12-21 MED ORDER — MENTHOL 3 MG MT LOZG
1.0000 | LOZENGE | OROMUCOSAL | Status: DC | PRN
  Filled 2020-12-21: qty 9

## 2020-12-21 MED ORDER — POTASSIUM CHLORIDE CRYS ER 20 MEQ PO TBCR: 40.0000 meq | EXTENDED_RELEASE_TABLET | Freq: Once | ORAL | Status: DC

## 2020-12-21 NOTE — Progress Notes (Signed)
Physical Therapy Treatment Patient Details Name: [name] MRN: [id number] DOB: 09/07/1955 Today's Date: 12/21/2020    History of Present Illness Patient is a 65 y/o female who presents on 12/19/20 with SOB, chest pain, N/V and emesis. Admitted with CHF exacerbation. PMH includes HTN. DM2, A-fib, CHF, OSA, DVT, COPD.    PT Comments    Patient progressing well towards PT goals. Improved ambulation distance with use of rollator and Min guard assist for safety. 2/4 DOE noted with Sp02 >90% on RA throughout activity. Balance and endurance much improved with BUE support. Encouraged increasing activity slowly. Will follow.   Follow Up Recommendations  Home health PT;Supervision - Intermittent     Equipment Recommendations  None recommended by PT    Recommendations for Other Services       Precautions / Restrictions Precautions Precautions: Other (comment);Fall Precaution Comments: watch 02, hx of falls Restrictions Weight Bearing Restrictions: No    Mobility  Bed Mobility Overal bed mobility: Needs Assistance Bed Mobility: Supine to Sit     Supine to sit: Supervision;HOB elevated     General bed mobility comments: No assist needed. Use of rail.    Transfers Overall transfer level: Needs assistance Equipment used: None Transfers: Sit to/from Stand Sit to Stand: Min guard         General transfer comment: Min guard for safety. Stood from [employer], from toilet x1, slow to rise and transferred to chair post ambulation. Initially unsteady.  Ambulation/Gait Ambulation/Gait assistance: Min guard;Min assist Gait Distance (Feet): 16 Feet (+ 120') Assistive device: 4-wheeled walker;None Gait Pattern/deviations: Step-through pattern;Decreased stride length;Wide base of support Gait velocity: decreased   General Gait Details: Slow, unsteady gait reaching for furniture for support to get to bathroom without DME; balance improved with use of rollator. 2/4 DOE. Sp02 >90%  on RA.   Stairs             Wheelchair Mobility    Modified Rankin (Stroke Patients Only)       Balance Overall balance assessment: History of Falls;Needs assistance Sitting-balance support: Feet supported;No upper extremity supported Sitting balance-Leahy Scale: Good     Standing balance support: During functional activity Standing balance-Leahy Scale: Fair Standing balance comment: Able to stand at sink and wash hands with no difficulty.                            Cognition Arousal/Alertness: Awake/alert Behavior During Therapy: WFL for tasks assessed/performed Overall Cognitive Status: Within Functional Limits for tasks assessed                                        Exercises      General Comments General comments (skin integrity, edema, etc.): Sp02 stayed >90% on RA with activity. HR 112 bpm.      Pertinent Vitals/Pain Pain Assessment: Faces Faces Pain Scale: Hurts a little bit Pain Descriptors / Indicators: Aching Pain Intervention(s): Monitored during session    Home Living                      Prior Function            PT Goals (current goals can now be found in the care plan section) Progress towards PT goals: Progressing toward goals    Frequency    Min 3X/week  PT Plan Current plan remains appropriate    Co-evaluation              AM-PAC PT "6 Clicks" Mobility   Outcome Measure  Help needed turning from your back to your side while in a flat bed without using bedrails?: A Little Help needed moving from lying on your back to sitting on the side of a flat bed without using bedrails?: A Little Help needed moving to and from a bed to a chair (including a wheelchair)?: A Little Help needed standing up from a chair using your arms (e.g., wheelchair or bedside chair)?: A Little Help needed to walk in hospital room?: A Little Help needed climbing 3-5 steps with a railing? : A Lot 6 Click  Score: 17    End of Session Equipment Utilized During Treatment: Gait belt Activity Tolerance: Patient tolerated treatment well Patient left: in chair;with call bell/phone within reach;with chair alarm set Nurse Communication: Mobility status PT Visit Diagnosis: Pain;Muscle weakness (generalized) (M62.81);Unsteadiness on feet (R26.81);Difficulty in walking, not elsewhere classified (R26.2) Pain - part of body:  (chest)     Time: 6568-1275 PT Time Calculation (min) (ACUTE ONLY): 21 min  Charges:  $Gait Training: 8-22 mins                     Marisa Severin, PT, DPT Acute Rehabilitation Services Pager [phone] Office [place] 12/21/2020, 1:32 PM

## 2020-12-21 NOTE — Progress Notes (Addendum)
Family Medicine Teaching Service Daily Progress Note Intern Pager: [phone]  Patient name: [name] Medical record number: [id number] Date of birth: [birth year] Age: 65 y.o. Gender: female  Primary Care Provider: [name], NP Consultants: Cardiology Code Status: FULL  Pt Overview and Major Events to Date:  5/1: Admitted   Assessment and Plan: [name] a 65 y.o.femalewho presented with SOB and chest pain concerning for CHF exacerbation. PMH is significant forHFmrEF with EF 45-50%, atrial fibrillation on anticoagulation, GERD, HTN,and T2DM.  Combined systolic and diastolic CHF Exacerbation  With 1.8L UOP though she did take in about 1.4L of fluid yesterday. Total since admission only -499cc. Weight has remained stable/slightly increased (+0.3kg) since yesterday. Electrolytes are stable, mild bump in creatinine since starting diuresis on admission but overall stable from yesterday: Cr 1.68>1.83>1.86.  -Cardiology following, appreciate recommendations -Continue Lasix 80 mg BID; would consider increasing diuresis -Continue Toprol-XL 100 mg BID  -Add Spiro/SGLT-2 as able  -Fluid restriction 1200cc  Severe Mitral Regurgitation Confirmed with TEE last month. Scheduled for CV surgery outpatient.   Persistent Atrial Fibrillation  HR 80-low 100's. On AC. Severe MR likely contributing.  -Cardiology following, appreciate recommendations -Continue home medications: Digoxin, metoprolol succinate, amiodarone, eliquis -Keep K>4, Mg>2 -Repeat digoxin level tomorrow  T2DM with Neuropathy: Chronic, stable CBG's 106-174 in last 24 hours. Only requiring 2 unites SSI.  -CBG's AC/qHS -Continue insulin detemir 20U BID -sSSI  -Continue crestor 10 mg daily  -Continue Gabapentin 600 mg BID   HTN: Chronic, stable BP's ranging 122-136/81-92.  -Continue Toprol-XL 100 mg BID  -Monitor BP's   HLD: chronic, stable -Continue crestor 10 mg daily   GERD: Chronic,  stable -Can add back home medication of alka-seltzer as needed.   Depression: chronic, stable -Continue Duloxetine 60 mg daily   Unintentional Weight Loss  Reports 200 lb loss since last fall. Per chart review, appears that weights have been relatively stable.  FEN/GI: Heart healthy/carb modified PPx: Eliquis   Status is: Observation  The patient will require care spanning > 2 midnights and should be moved to inpatient because: IV treatments appropriate due to intensity of illness or inability to take PO and Inpatient level of care appropriate due to severity of illness  Dispo: The patient is from: Home              Anticipated d/c is to: Home              Patient currently is not medically stable to d/c.   Difficult to place patient No  Subjective:  Patient feels slightly improved. She mentions that she still gets short of breath with movement, like for example when she got up this morning to get weighted. States that being short of breath is not abnormal for her. She still has the same chest pain as before though it has not worsened.   Objective: Temp:  [97.7 F (36.5 C)-98.9 F (37.2 C)] 98 F (36.7 C) (05/03 0406) Pulse Rate:  [88-108] 90 (05/03 0406) Resp:  [18-20] 18 (05/03 0406) BP: (122-136)/(81-92) 122/88 (05/03 0406) SpO2:  [95 %-100 %] 100 % (05/03 0406) Weight:  [129.8 kg] 129.8 kg (05/03 0406) Physical Exam: General: Awake, alert, obese, in no distress, pleasant, conversational Cardiovascular: Irregularly irregular, no obvious JVD, 2+ pulses b/l radial and DP  Respiratory: CTAB without appreciation of wheezing/rhonchi/rales today  Extremities: non-pitting edema b/l LE  Laboratory: Recent Labs  Lab 12/19/20 1016  WBC 7.1  HGB 12.2  HCT 39.4  PLT 187  Recent Labs  Lab 12/19/20 1016 12/20/20 0743 12/21/20 0403  NA 141 140 137  K 3.5 3.1* 3.9  CL 102 97* 99  CO2 30 33* 28  BUN 16 15 22   CREATININE 1.68* 1.83* 1.86*  CALCIUM 9.8 9.2 9.0  PROT 7.9   --   --   BILITOT 0.9  --   --   ALKPHOS 87  --   --   ALT 12  --   --   AST 19  --   --   GLUCOSE 164* 151* 106*    Imaging/Diagnostic Tests: No results found.  Sharion Settler, DO 12/21/2020, 5:50 AM PGY-1, [place] Intern pager: [phone], text pages welcome

## 2020-12-21 NOTE — Progress Notes (Incomplete)
Progress Note  Patient Name: [name] Date of Encounter: 12/21/2020  [facility] Cardiologist: Buford Dresser, MD   Subjective   ***  Inpatient Medications    Scheduled Meds: . amiodarone  200 mg Oral BID  . apixaban  5 mg Oral BID  . cycloSPORINE  1 drop Both Eyes BID  . digoxin  0.125 mg Oral Daily  . DULoxetine  60 mg Oral Daily  . furosemide  80 mg Intravenous BID  . gabapentin  600 mg Oral BID  . insulin aspart  0-9 Units Subcutaneous TID WC  . insulin detemir  20 Units Subcutaneous BID  . metoprolol succinate  100 mg Oral BID  . potassium chloride  40 mEq Oral Once  . rosuvastatin  10 mg Oral Daily  . sodium chloride flush  3 mL Intravenous Q12H   Continuous Infusions: . sodium chloride     PRN Meds: sodium chloride, acetaminophen, albuterol, menthol-cetylpyridinium, sodium chloride flush   Vital Signs    Vitals:   12/20/20 2051 12/21/20 0406 12/21/20 1700 12/21/20 2001  BP: 136/81 122/88 135/81 (!) 143/80  Pulse: 88 90 76 77  Resp: 20 18 18    Temp: 98.7 F (37.1 C) 98 F (36.7 C) (!) 97.5 F (36.4 C)   TempSrc: Oral Oral Oral   SpO2: 95% 100% 98% 100%  Weight:  129.8 kg    Height:        Intake/Output Summary (Last 24 hours) at 12/21/2020 2017 Last data filed at 12/21/2020 1900 Gross per 24 hour  Intake 1300 ml  Output 1300 ml  Net 0 ml   Last 3 Weights 12/21/2020 12/20/2020 12/19/2020  Weight (lbs) 286 lb 2.5 oz 285 lb 6.4 oz 285 lb 15 oz  Weight (kg) 129.8 kg 129.457 kg 129.7 kg  Some encounter information is confidential and restricted. Go to Review Flowsheets activity to see all data.      Telemetry    *** - Personally Reviewed  ECG    *** - Personally Reviewed  Physical Exam  *** GEN: No acute distress.   Neck: No JVD Cardiac: RRR, no murmurs, rubs, or gallops.  Respiratory: Clear to auscultation bilaterally. GI: Soft, nontender, non-distended  MS: No edema; No deformity. Neuro:  Nonfocal  Psych: Normal affect    Labs    High Sensitivity Troponin:   Recent Labs  Lab 12/19/20 1016 12/19/20 1216  TROPONINIHS 8 12      Chemistry Recent Labs  Lab 12/19/20 1016 12/20/20 0743 12/21/20 0403  NA 141 140 137  K 3.5 3.1* 3.9  CL 102 97* 99  CO2 30 33* 28  GLUCOSE 164* 151* 106*  BUN 16 15 22   CREATININE 1.68* 1.83* 1.86*  CALCIUM 9.8 9.2 9.0  PROT 7.9  --   --   ALBUMIN 3.4*  --   --   AST 19  --   --   ALT 12  --   --   ALKPHOS 87  --   --   BILITOT 0.9  --   --   GFRNONAA 34* 30* 30*  ANIONGAP 9 10 10      Hematology Recent Labs  Lab 12/19/20 1016  WBC 7.1  RBC 4.28  HGB 12.2  HCT 39.4  MCV 92.1  MCH 28.5  MCHC 31.0  RDW 14.3  PLT 187    BNP Recent Labs  Lab 12/19/20 1016  BNP 227.9*     DDimer No results for input(s): DDIMER in the last 168  hours.   Radiology    No results found.  Cardiac Studies   TEE 11/26/20: IMPRESSIONS  1. Left ventricular ejection fraction, by estimation, is 45 to 50%. The  left ventricle has mildly decreased function. The left ventricle  demonstrates global hypokinesis.  2. Right ventricular systolic function is moderately reduced. The right  ventricular size is moderately enlarged.  3. Left atrial size was severely dilated. No left atrial/left atrial  appendage thrombus was detected.  4. Right atrial size was severely dilated.  5. MR more appreciable early in the study when blood pressure was normal.  MR jet appears less later in the study, likely due to reduction in blood  pressure. Posterior leaflet appears mildly restricted, with override of  anterior leaflet. Systolic flow  reversal seen in right sided pulmonary veins.. The mitral valve is  abnormal. Severe mitral valve regurgitation. The mean mitral valve  gradient is 2.0 mmHg.  6. Tricuspid valve regurgitation is severe.  7. The aortic valve is tricuspid. Aortic valve regurgitation is not  visualized. No aortic stenosis is present.    Conclusion(s)/Recommendation(s): Severe mitral regurgitation (PV systolic  flow reversal) likely due to restricted posterior leaflet and overriding  anterior leaflet. Severe TR.   FINDINGS  Left Ventricle: Left ventricular ejection fraction, by estimation, is 45  to 50%. The left ventricle has mildly decreased function. The left  ventricle demonstrates global hypokinesis. The left ventricular internal  cavity size was normal in size.   Right Ventricle: The right ventricular size is moderately enlarged. No  increase in right ventricular wall thickness. Right ventricular systolic  function is moderately reduced.   Left Atrium: Left atrial size was severely dilated. No left atrial/left  atrial appendage thrombus was detected.   Right Atrium: Right atrial size was severely dilated.   Pericardium: There is no evidence of pericardial effusion.   Mitral Valve: MR more appreciable early in the study when blood pressure  was normal. MR jet appears less later in the study, likely due to  reduction in blood pressure. Posterior leaflet appears mildly restricted,  with override of anterior leaflet.  Systolic flow reversal seen in right sided pulmonary veins. The mitral  valve is abnormal. There is mild thickening of the mitral valve  leaflet(s). There is mild calcification of the mitral valve leaflet(s).  Mild mitral annular calcification. Severe  mitral valve regurgitation. MV peak gradient, 5.6 mmHg. The mean mitral  valve gradient is 2.0 mmHg.   Tricuspid Valve: The tricuspid valve is normal in structure. Tricuspid  valve regurgitation is severe.   Aortic Valve: The aortic valve is tricuspid. Aortic valve regurgitation is  not visualized. No aortic stenosis is present.   Pulmonic Valve: The pulmonic valve was grossly normal. Pulmonic valve  regurgitation is mild.   Aorta: The aortic root and ascending aorta are structurally normal, with  no evidence of dilitation.   IAS/Shunts:  No atrial level shunt detected by color flow Doppler. Agitated  saline contrast was given intravenously to evaluate for intracardiac  shunting.   Echo 07/09/20 1. Left ventricular ejection fraction, by estimation, is 45 to 50%. The  left ventricle has mildly decreased function. The left ventricle  demonstrates global hypokinesis. Left ventricular diastolic function could  not be evaluated.  2. Right ventricular systolic function was not well visualized. The right  ventricular size is moderately enlarged. There is normal pulmonary artery  systolic pressure.  3. Left atrial size was mildly dilated.  4. Right atrial size was moderately dilated.  5. Eccentric MR jet; appears mild-moderate on parasternal views, but on  apical views (see images 52, 66, 72) extends into distal atrium and may  have Coanda effect. Incompletely visualized and PV not sampled, suspect  moderate to severe MR.. The mitral  valve is rheumatic. Moderate to severe mitral valve regurgitation.  6. Tricuspid valve regurgitation is severe.  7. The aortic valve is tricuspid. Aortic valve regurgitation is not  visualized. No aortic stenosis is present.  8. The inferior vena cava is normal in size with <50% respiratory  variability, suggesting right atrial pressure of 8 mmHg.   Echo 07/02/2018 - Left ventricle: The cavity size was normal. Systolic function was  mildly reduced. The estimated ejection fraction was in the range  of 45% to 50%. There is moderate hypokinesis of the  entireanteroseptal myocardium.  - Aortic valve: There was mild regurgitation.  - Mitral valve: Mildly dilated, calcified annulus. There was severe  regurgitation.  - Left atrium: The atrium was severely dilated.  - Right ventricle: The cavity size was mildly dilated. Wall  thickness was normal. Systolic function was mildly to moderately  reduced.  - Right atrium: The atrium was severely dilated.  - Tricuspid valve: There was  severe regurgitation.  - Pulmonary arteries: Systolic pressure was mildly increased. PA  peak pressure: 31 mm Hg (S).   Cath 12/31/2017  Prox LAD lesion is 10% stenosed.  Dist LM to Ost LAD lesion is 10% stenosed.  Ost Cx lesion is 15% stenosed.  The left ventricular systolic function is normal.  LV end diastolic pressure is normal.   Patient Profile     65 y.o. female  with a hx of chronic diastolic and systolic HF with mildly reduced LVEF (45-50%), severe MR, atrial fibrillation on eliquis, HTN, and DMIIwho presented with worsening SOB, weight gain and LE edema found to have elevated BNP and pulmonary edema on chest xray consistent with acute on chronic diastolic HF exacerbation.  Assessment & Plan    #Acute on chronic combined systolic and diastolic HF: Patient presented with worsening SOB, weight gain and LE edema with BNP 227 and pulmonary edema on CXR consistent with acute on chronic HF exacerbation TTE in 06/2020 with LVEF 45-50%, global HK, moderate RV enlargement, mild LAE, moderate RAE, moderate-to-severe MR, severe TR. Currently with NYHA class III symptoms.  -Diurese with lasix 80mg  IV BID -Continue metop 100mg  XL BID -Add spiro/SGLT-2i as able pending renal function -Cath 2019 without significant CAD -Monitor I/Os and daily weights -Low Na diet  #Persistent Atrial Fibrillation: CHADs-vasc 4-5 (gender, HTN, HF, DM, and vascular disease (only mild on cath)).Difficult to maintain sinus and has failed tikosyn load in the past (brief conversion to NSR but flipped back into Afib). LikelysevereMR contributing with difficulty maintaining rhythm. -Continue eliquis -Continue amiodarone200mg  BID-- will down-titrate as out-patient  -Continue digoxin; level within range at 0.9 -Continue metop 100mg  BID -Likely MAZE procedure if plans for MVR  #Severe MR #Severe TR: -TEE with confirmed severe MR; awaiting CV surgery evaluation as out-patient -Diuresis as  above  #HLD: -Continue crestor 10mg  daily  #DMII: On insulin. -Management per primary team  {Are we signing off today?:[id number]}  For questions or updates, please contact [place] Please consult [email or web address] for contact info under        Signed, Freada Bergeron, MD  12/21/2020, 8:17 PM

## 2020-12-21 NOTE — Plan of Care (Signed)
  Problem: Education: Goal: Ability to demonstrate management of disease process will improve Outcome: Progressing Goal: Ability to verbalize understanding of medication therapies will improve Outcome: Progressing Goal: Individualized Educational Video(s) Outcome: Progressing   Problem: Education: Goal: Individualized Educational Video(s) Outcome: Progressing   Problem: Activity: Goal: Capacity to carry out activities will improve Outcome: Progressing   Problem: Cardiac: Goal: Ability to achieve and maintain adequate cardiopulmonary perfusion will improve Outcome: Progressing

## 2020-12-21 NOTE — Progress Notes (Signed)
Received report from Sharin Grave, RN and agree with previous assessment.

## 2020-12-22 ENCOUNTER — Encounter (HOSPITAL_COMMUNITY)
Admission: EM | Disposition: A | Discharge status: Home Health | Payer: Self-pay | Source: Home / Self Care | Attending: Thoracic Surgery (Cardiothoracic Vascular Surgery)

## 2020-12-22 ENCOUNTER — Ambulatory Visit (INDEPENDENT_AMBULATORY_CARE_PROVIDER_SITE_OTHER): Payer: Medicare Other | Admitting: Primary Care

## 2020-12-22 ENCOUNTER — Encounter (HOSPITAL_COMMUNITY): Payer: Self-pay | Admitting: Cardiology

## 2020-12-22 ENCOUNTER — Inpatient Hospital Stay: Payer: Self-pay

## 2020-12-22 DIAGNOSIS — I509 Heart failure, unspecified: Secondary | ICD-10-CM

## 2020-12-22 DIAGNOSIS — I4819 Other persistent atrial fibrillation: Secondary | ICD-10-CM | POA: Diagnosis not present

## 2020-12-22 DIAGNOSIS — I5043 Acute on chronic combined systolic (congestive) and diastolic (congestive) heart failure: Secondary | ICD-10-CM | POA: Diagnosis not present

## 2020-12-22 HISTORY — PX: RIGHT HEART CATH: CATH118263

## 2020-12-22 LAB — BASIC METABOLIC PANEL
Anion gap: 14 (ref 5–15)
BUN: 28 mg/dL — ABNORMAL HIGH (ref 8–23)
CO2: 24 mmol/L (ref 22–32)
Calcium: 9 mg/dL (ref 8.9–10.3)
Chloride: 100 mmol/L (ref 98–111)
Creatinine, Ser: 1.94 mg/dL — ABNORMAL HIGH (ref 0.44–1.00)
GFR, Estimated: 28 mL/min — ABNORMAL LOW (ref >60)
Glucose, Bld: 92 mg/dL (ref 70–99)
Potassium: 3.8 mmol/L (ref 3.5–5.1)
Sodium: 138 mmol/L (ref 135–145)

## 2020-12-22 LAB — POCT I-STAT EG7
Acid-Base Excess: 6 mmol/L — ABNORMAL HIGH (ref 0.0–2.0)
Acid-Base Excess: 8 mmol/L — ABNORMAL HIGH (ref 0.0–2.0)
Bicarbonate: 33.1 mmol/L — ABNORMAL HIGH (ref 20.0–28.0)
Bicarbonate: 34.9 mmol/L — ABNORMAL HIGH (ref 20.0–28.0)
Calcium, Ion: 1.18 mmol/L (ref 1.15–1.40)
Calcium, Ion: 1.22 mmol/L (ref 1.15–1.40)
HCT: 38 % (ref 36.0–46.0)
HCT: 39 % (ref 36.0–46.0)
Hemoglobin: 12.9 g/dL (ref 12.0–15.0)
Hemoglobin: 13.3 g/dL (ref 12.0–15.0)
O2 Saturation: 57 %
O2 Saturation: 58 %
Potassium: 3.7 mmol/L (ref 3.5–5.1)
Potassium: 3.8 mmol/L (ref 3.5–5.1)
Sodium: 141 mmol/L (ref 135–145)
Sodium: 142 mmol/L (ref 135–145)
TCO2: 35 mmol/L — ABNORMAL HIGH (ref 22–32)
TCO2: 37 mmol/L — ABNORMAL HIGH (ref 22–32)
pCO2, Ven: 56 mmHg (ref 44.0–60.0)
pCO2, Ven: 58.8 mmHg (ref 44.0–60.0)
pH, Ven: 7.38 (ref 7.250–7.430)
pH, Ven: 7.382 (ref 7.250–7.430)
pO2, Ven: 31 mmHg — CL (ref 32.0–45.0)
pO2, Ven: 32 mmHg (ref 32.0–45.0)

## 2020-12-22 LAB — GLUCOSE, CAPILLARY
Glucose-Capillary: 104 mg/dL — ABNORMAL HIGH (ref 70–99)
Glucose-Capillary: 103 mg/dL — ABNORMAL HIGH (ref 70–99)
Glucose-Capillary: 140 mg/dL — ABNORMAL HIGH (ref 70–99)
Glucose-Capillary: 144 mg/dL — ABNORMAL HIGH (ref 70–99)

## 2020-12-22 LAB — DIGOXIN LEVEL: Digoxin Level: 0.8 ng/mL (ref 0.8–2.0)

## 2020-12-22 SURGERY — RIGHT HEART CATH

## 2020-12-22 MED ORDER — FUROSEMIDE 10 MG/ML IJ SOLN: 120.0000 mg | Freq: Two times a day (BID) | INTRAVENOUS | Status: DC

## 2020-12-22 MED ORDER — LIDOCAINE HCL (PF) 1 % IJ SOLN
INTRAMUSCULAR | Status: AC
  Filled 2020-12-22: qty 30

## 2020-12-22 MED ORDER — SODIUM CHLORIDE 0.9 % IV SOLN: INTRAVENOUS | Status: DC

## 2020-12-22 MED ORDER — FUROSEMIDE 10 MG/ML IJ SOLN
80.0000 mg | Freq: Two times a day (BID) | INTRAMUSCULAR | Status: DC
  Administered 2020-12-22 – 2020-12-28 (×12): 80 mg via INTRAVENOUS
  Filled 2020-12-22 (×12): qty 8

## 2020-12-22 MED ORDER — HEPARIN (PORCINE) IN NACL 1000-0.9 UT/500ML-% IV SOLN
INTRAVENOUS | Status: AC
  Filled 2020-12-22: qty 500

## 2020-12-22 MED ORDER — SODIUM CHLORIDE 0.9% FLUSH: 3.0000 mL | INTRAVENOUS | Status: DC | PRN

## 2020-12-22 MED ORDER — SODIUM CHLORIDE 0.9 % IV SOLN: 250.0000 mL | INTRAVENOUS | Status: DC | PRN

## 2020-12-22 MED ORDER — SODIUM CHLORIDE 0.9% FLUSH
3.0000 mL | Freq: Two times a day (BID) | INTRAVENOUS | Status: DC
  Administered 2020-12-22 – 2020-12-28 (×8): 3 mL via INTRAVENOUS

## 2020-12-22 MED ORDER — ASPIRIN 81 MG PO CHEW
81.0000 mg | CHEWABLE_TABLET | ORAL | Status: AC
  Administered 2020-12-22: 81 mg via ORAL
  Filled 2020-12-22: qty 1

## 2020-12-22 MED ORDER — LIDOCAINE HCL (PF) 1 % IJ SOLN
INTRAMUSCULAR | Status: DC | PRN
  Administered 2020-12-22: 2 mL

## 2020-12-22 MED ORDER — HEPARIN (PORCINE) IN NACL 1000-0.9 UT/500ML-% IV SOLN
INTRAVENOUS | Status: DC | PRN
  Administered 2020-12-22: 500 mL

## 2020-12-22 MED ORDER — MILRINONE LACTATE IN DEXTROSE 20-5 MG/100ML-% IV SOLN
0.1250 ug/kg/min | INTRAVENOUS | Status: DC
  Administered 2020-12-22 – 2021-01-01 (×13): 0.125 ug/kg/min via INTRAVENOUS
  Administered 2021-01-01 – 2021-01-05 (×10): 0.25 ug/kg/min via INTRAVENOUS
  Administered 2021-01-06 – 2021-01-07 (×2): 0.125 ug/kg/min via INTRAVENOUS
  Filled 2020-12-22 (×25): qty 100

## 2020-12-22 SURGICAL SUPPLY — 7 items
CATH BALLN WEDGE 5F 110CM (CATHETERS) ×2 IMPLANT
KIT HEART LEFT (KITS) ×2 IMPLANT
PACK CARDIAC CATHETERIZATION (CUSTOM PROCEDURE TRAY) ×2 IMPLANT
SHEATH GLIDE SLENDER 4/5FR (SHEATH) ×2 IMPLANT
TRANSDUCER W/STOPCOCK (MISCELLANEOUS) ×2 IMPLANT
TUBING ART PRESS 72  MALE/FEM (TUBING) ×2
TUBING ART PRESS 72 MALE/FEM (TUBING) ×1 IMPLANT

## 2020-12-22 NOTE — H&P (View-Only) (Signed)
Progress Note  Patient Name: [name] Date of Encounter: 12/22/2020  Primary Cardiologist: Buford Dresser, MD   Subjective   Doing well today. No chest pain or SOB  Inpatient Medications    Scheduled Meds: . amiodarone  200 mg Oral BID  . apixaban  5 mg Oral BID  . cycloSPORINE  1 drop Both Eyes BID  . digoxin  0.125 mg Oral Daily  . DULoxetine  60 mg Oral Daily  . furosemide  80 mg Intravenous BID  . gabapentin  600 mg Oral BID  . insulin aspart  0-9 Units Subcutaneous TID WC  . insulin detemir  20 Units Subcutaneous BID  . metoprolol succinate  100 mg Oral BID  . potassium chloride  40 mEq Oral Once  . rosuvastatin  10 mg Oral Daily  . sodium chloride flush  3 mL Intravenous Q12H   Continuous Infusions: . sodium chloride     PRN Meds: sodium chloride, acetaminophen, albuterol, menthol-cetylpyridinium, sodium chloride flush   Vital Signs    Vitals:   12/21/20 1700 12/21/20 2001 12/22/20 0356 12/22/20 0856  BP: 135/81 (!) 143/80 139/81 (!) 127/110  Pulse: 76 77 79 76  Resp: 18  18   Temp: (!) 97.5 F (36.4 C)  97.9 F (36.6 C)   TempSrc: Oral  Oral   SpO2: 98% 100% 99%   Weight:   129.8 kg   Height:        Intake/Output Summary (Last 24 hours) at 12/22/2020 0937 Last data filed at 12/22/2020 0900 Gross per 24 hour  Intake 1060 ml  Output 1750 ml  Net -690 ml   Filed Weights   12/20/20 0501 12/21/20 0406 12/22/20 0356  Weight: 129.5 kg 129.8 kg 129.8 kg    Physical Exam   General: Well developed, well nourished, NAD Skin: Warm, dry, intact  Head: Normocephalic, atraumatic, sclera non-icteric, no xanthomas, clear, moist mucus membranes. Neck: Negative for carotid bruits. No JVD Lungs:Clear to ausculation bilaterally. No wheezes, rales, or rhonchi. Breathing is unlabored. Cardiovascular: RRR with S1 S2. No murmurs, rubs, gallops, or LV heave appreciated. Abdomen: Soft, non-tender, non-distended with normoactive bowel sounds. No  hepatomegaly, No rebound/guarding. No obvious abdominal masses. MSK: Strength and tone appear normal for age. 5/5 in all extremities Extremities: No edema. No clubbing or cyanosis. DP/PT pulses 2+ bilaterally Neuro: Alert and oriented. No focal deficits. No facial asymmetry. MAE spontaneously. Psych: Responds to questions appropriately with normal affect.    Labs    Chemistry Recent Labs  Lab 12/19/20 1016 12/20/20 0743 12/21/20 0403 12/22/20 0427  NA 141 140 137 138  K 3.5 3.1* 3.9 3.8  CL 102 97* 99 100  CO2 30 33* 28 24  GLUCOSE 164* 151* 106* 92  BUN 16 15 22  28*  CREATININE 1.68* 1.83* 1.86* 1.94*  CALCIUM 9.8 9.2 9.0 9.0  PROT 7.9  --   --   --   ALBUMIN 3.4*  --   --   --   AST 19  --   --   --   ALT 12  --   --   --   ALKPHOS 87  --   --   --   BILITOT 0.9  --   --   --   GFRNONAA 34* 30* 30* 28*  ANIONGAP 9 10 10 14      Hematology Recent Labs  Lab 12/19/20 1016  WBC 7.1  RBC 4.28  HGB 12.2  HCT 39.4  MCV  92.1  MCH 28.5  MCHC 31.0  RDW 14.3  PLT 187    Cardiac EnzymesNo results for input(s): TROPONINI in the last 168 hours. No results for input(s): TROPIPOC in the last 168 hours.   BNP Recent Labs  Lab 12/19/20 1016  BNP 227.9*     DDimer No results for input(s): DDIMER in the last 168 hours.   Radiology    No results found.  Telemetry    12/22/20 AF with HR in the 70's- Personally Reviewed  ECG    No new tracing as of 12/22/20 - Personally Reviewed  Cardiac Studies   TEE 11/26/20: IMPRESSIONS  1. Left ventricular ejection fraction, by estimation, is 45 to 50%. The  left ventricle has mildly decreased function. The left ventricle  demonstrates global hypokinesis.  2. Right ventricular systolic function is moderately reduced. The right  ventricular size is moderately enlarged.  3. Left atrial size was severely dilated. No left atrial/left atrial  appendage thrombus was detected.  4. Right atrial size was severely dilated.  5. MR  more appreciable early in the study when blood pressure was normal.  MR jet appears less later in the study, likely due to reduction in blood  pressure. Posterior leaflet appears mildly restricted, with override of  anterior leaflet. Systolic flow  reversal seen in right sided pulmonary veins.. The mitral valve is  abnormal. Severe mitral valve regurgitation. The mean mitral valve  gradient is 2.0 mmHg.  6. Tricuspid valve regurgitation is severe.  7. The aortic valve is tricuspid. Aortic valve regurgitation is not  visualized. No aortic stenosis is present.   Conclusion(s)/Recommendation(s): Severe mitral regurgitation (PV systolic  flow reversal) likely due to restricted posterior leaflet and overriding  anterior leaflet. Severe TR.   FINDINGS  Left Ventricle: Left ventricular ejection fraction, by estimation, is 45  to 50%. The left ventricle has mildly decreased function. The left  ventricle demonstrates global hypokinesis. The left ventricular internal  cavity size was normal in size.   Right Ventricle: The right ventricular size is moderately enlarged. No  increase in right ventricular wall thickness. Right ventricular systolic  function is moderately reduced.   Left Atrium: Left atrial size was severely dilated. No left atrial/left  atrial appendage thrombus was detected.   Right Atrium: Right atrial size was severely dilated.   Pericardium: There is no evidence of pericardial effusion.   Mitral Valve: MR more appreciable early in the study when blood pressure  was normal. MR jet appears less later in the study, likely due to  reduction in blood pressure. Posterior leaflet appears mildly restricted,  with override of anterior leaflet.  Systolic flow reversal seen in right sided pulmonary veins. The mitral  valve is abnormal. There is mild thickening of the mitral valve  leaflet(s). There is mild calcification of the mitral valve leaflet(s).  Mild mitral annular  calcification. Severe  mitral valve regurgitation. MV peak gradient, 5.6 mmHg. The mean mitral  valve gradient is 2.0 mmHg.   Tricuspid Valve: The tricuspid valve is normal in structure. Tricuspid  valve regurgitation is severe.   Aortic Valve: The aortic valve is tricuspid. Aortic valve regurgitation is  not visualized. No aortic stenosis is present.   Pulmonic Valve: The pulmonic valve was grossly normal. Pulmonic valve  regurgitation is mild.   Aorta: The aortic root and ascending aorta are structurally normal, with  no evidence of dilitation.   IAS/Shunts: No atrial level shunt detected by color flow Doppler. Agitated  saline contrast  was given intravenously to evaluate for intracardiac  shunting.   Echo 07/09/20 1. Left ventricular ejection fraction, by estimation, is 45 to 50%. The  left ventricle has mildly decreased function. The left ventricle  demonstrates global hypokinesis. Left ventricular diastolic function could  not be evaluated.  2. Right ventricular systolic function was not well visualized. The right  ventricular size is moderately enlarged. There is normal pulmonary artery  systolic pressure.  3. Left atrial size was mildly dilated.  4. Right atrial size was moderately dilated.  5. Eccentric MR jet; appears mild-moderate on parasternal views, but on  apical views (see images 52, 66, 72) extends into distal atrium and may  have Coanda effect. Incompletely visualized and PV not sampled, suspect  moderate to severe MR.. The mitral  valve is rheumatic. Moderate to severe mitral valve regurgitation.  6. Tricuspid valve regurgitation is severe.  7. The aortic valve is tricuspid. Aortic valve regurgitation is not  visualized. No aortic stenosis is present.  8. The inferior vena cava is normal in size with <50% respiratory  variability, suggesting right atrial pressure of 8 mmHg.   Echo 07/02/2018 - Left ventricle: The cavity size was normal. Systolic  function was  mildly reduced. The estimated ejection fraction was in the range  of 45% to 50%. There is moderate hypokinesis of the  entireanteroseptal myocardium.  - Aortic valve: There was mild regurgitation.  - Mitral valve: Mildly dilated, calcified annulus. There was severe  regurgitation.  - Left atrium: The atrium was severely dilated.  - Right ventricle: The cavity size was mildly dilated. Wall  thickness was normal. Systolic function was mildly to moderately  reduced.  - Right atrium: The atrium was severely dilated.  - Tricuspid valve: There was severe regurgitation.  - Pulmonary arteries: Systolic pressure was mildly increased. PA  peak pressure: 31 mm Hg (S).   Cath 12/31/2017  Prox LAD lesion is 10% stenosed.  Dist LM to Ost LAD lesion is 10% stenosed.  Ost Cx lesion is 15% stenosed.  The left ventricular systolic function is normal.  LV end diastolic pressure is normal.   Patient Profile     65 y.o. female with a hx of chronic diastolic and systolic HF with mildly reduced LVEF (45-50%), severe MR, atrial fibrillation on eliquis, HTN, and DMIIwho presented with worsening SOB, weight gain and LE edema found to have elevated BNP and pulmonary edema on chest xray consistent with acute on chronic diastolic HF exacerbation.  Assessment & Plan    1.  Acute on chronic combined systolic and diastolic CHF: -Patient presented with worsening shortness of breath and weight gain with LE edema found to have a BNP at 2237 and pulmonary edema on CXR -Echocardiogram from 06/2020 with LVEF of 45 to 50% with global hypokinesis, moderate RV enlargement, mild LAE, moderate RAE, severe MR and severe TR with NYHA class III symptoms -Continue with IV Lasix 80 mg twice daily, Toprol XL 100 mg twice daily -Plan to add spironolactone, SGLT2 as renal function allows -Consider RHC to fully assess volume status -Weight, 286lb within admission weight at 285llb  -I&O, net -949  mL  2.  Persistent atrial fibrillation: -Has a history of failed Tikosyn load in the past with brief conversion to NSR however back to atrial fibrillation felt to likely be secondary to severe MR -Continue anticoagulation with Eliquis, amiodarone 200 mg twice daily, digoxin and Toprol-XL 100 mg twice daily -Likely candidate for Maze procedure with MVR however this will be  determined by thoracic surgery  3.  Severe MR/severe TR: -TEE with confirmed severe MR awaiting CV surgery evaluation in the outpatient setting -Plan as above  4.  HLD: -Continue Crestor 10 mg daily  5.  DM2: -Hemoglobin A1c, 9.3 -SSI for glucose control -Management per primary team  6.  CKD stage IV: -Baseline creatinine appears to be in the 1.6-1.7 range -Creatinine, 1.94 today which is up from 1.86 yesterday   Signed, Kathyrn Drown NP-C [place] Pager: [phone] 12/22/2020, 9:37 AM     For questions or updates, please contact   Please consult [email or web address] for contact info under Cardiology/STEMI.  Patient seen and examined and agree with Kathyrn Drown NP-C as detailed above.  In brief, the patient is a with a 65 year old female with history of chronic diastolic and systolic HF with mildly reduced LVEF (45-50%), severe MR, atrial fibrillation on eliquis, HTN, and DMIIwho presented with worsening SOB, weight gain and LE edema found to have elevated BNP and pulmonary edema on chest xray consistent with acute on chronic diastolic HF exacerbation.  Wt has been stable despite diuresis. Cr rising slightly. Symptoms overall improved, however, volume status difficult to determine. Will plan for RHC for further evaluation.  GEN: No acute distress.   Neck: Has prominent v-waves in JVD in setting of known severe TR Cardiac: Irregular, 2/6 systolic murmur Respiratory: Clear to auscultation bilaterally. GI: Soft, nontender, non-distended  MS: Trace, nonpitting edema Neuro:  Nonfocal  Psych: Normal affect    Plan: -Check RHC today to better assess filling pressures  -Will adjust diuretic dosing pending RHC findings -Continue metop 100mg  XL BID -Continue apixaban for [facility] -Plan for surgical evaluation for MVR given known severe MR which is likely contributing to acute on chronic HF exacerbation -Monitor I/Os and daily standing weights  INFORMED CONSENT: I have reviewed the risks, indications, and alternatives to right heart catheterization. Risks include but are not limited to bleeding, infection, vascular injury, arrhythmia, kidney injury, radiation-related injury in the case of prolonged fluoroscopy use, emergency cardiac surgery, and death. The patient understands the risks and is amenable to proceed.   Gwyndolyn Kaufman, MD

## 2020-12-22 NOTE — Progress Notes (Addendum)
Peripherally Inserted Central Catheter Placement  The IV Nurse has discussed with the patient and/or persons authorized to consent for the patient, the purpose of this procedure and the potential benefits and risks involved with this procedure.  The benefits include less needle sticks, lab draws from the catheter, and the patient may be discharged home with the catheter. Risks include, but not limited to, infection, bleeding, blood clot (thrombus formation), and puncture of an artery; nerve damage and irregular heartbeat and possibility to perform a PICC exchange if needed/ordered by physician.  Alternatives to this procedure were also discussed.  Bard Power PICC patient education guide, fact sheet on infection prevention and patient information card has been provided to patient /or left at bedside.    PICC Placement Documentation   LUE PICC insertion unsuccessful. Resistance met with flushing/ no blood return on first attempt. Line pulled back to reinsert and would not drop to SVC. Pressure dressing applied. Site unremarkable. S/P R brachial cardiac cath today. IV Team may re-attempt RUE on 5/5.     Rosalio Macadamia Chenice 12/22/2020, 10:09 PM

## 2020-12-22 NOTE — Progress Notes (Signed)
OT Cancellation Note  Patient Details Name: [name] MRN: [id number] DOB: July 17, 1956   Cancelled Treatment:    Reason Eval/Treat Not Completed: Patient at procedure or test/ unavailable. Transport taking pt off unit upon arrival to the floor. OT will follow up next available time  Britt Bottom 12/22/2020, 12:03 PM

## 2020-12-22 NOTE — Progress Notes (Signed)
Heart Failure Stewardship Pharmacist Progress Note   PCP: [name], NP PCP-Cardiologist: Buford Dresser, MD    HPI:  65 yo female with a hx of chronic diastolic and systolic HF, difficult-to-control atrial fibrillation on Eliquis, HTN, CAD with nonobstructive CAD on LHC in 2019, and T2DM. She presented with SOB and L-sided CP found to have acute CHF exacerbation. ECHO on 11/26/20 revealed LVEF 45-50%, RV function mildly reduced, severe MR and TR. RHC today revealed low CI (1.9) and elevated filling pressures (R>L) with low PAPi (1.8). Started on IV milrinone. Awaiting PICC placement.  Current HF Medications: IV furosemide 80 mg BID Metoprolol succinate 100 mg BID Digoxin 0.125 mg daily  Prior to admission HF Medications: Torsemide 40 mg BID Metoprolol succinate 100 mg BID Digoxin 0.125 mg daily  Pertinent Lab Values: . Serum creatinine 1.94, BUN 28, Potassium 3.8, Sodium 138, Digoxin 0.8   Vital Signs: . Weight: 286 lbs (admission weight: 285 lbs) . Blood pressure: 130/80s  . Heart rate: 70s   Medication Assistance / Insurance Benefits Check: Does the patient have prescription insurance?  Yes Type of insurance plan: UHC Medicare and [place] Medicaid  Outpatient Pharmacy:  Prior to admission [facility] Is the patient willing to use [facility] at discharge? Yes Is the patient willing to transition their outpatient pharmacy to utilize a [facility] outpatient pharmacy?   Pending    Assessment: 1. Acute on chronic systolic CHF (EF 02-23%), due to NICM. NYHA class II-III symptoms. - Low cardiac index on RHC (1.9) - starting IV milrinone 0.125 mcg/kg/min. Monitor Coox once PICC placed. - Continues to have labored breathing with ambulation, elevated RA 16 on RHC - continue IV furosemide 80 mg BID.  - Cardiac index low on metoprolol - recommend stopping metoprolol succinate  - Continue digoxin 0.125 mg daily - trough level WNL today - Can consider  addition of Entresto, spironolactone, and SGLT2i once off IV milrinone and renal function improves   Plan: 1) Medication changes recommended at this time: - Stop metoprolol succinate  2) Patient assistance: - None needed for now  3)  Education  - To be completed prior to discharge  Richardine Service, PharmD, BCPS Kerby Nora, PharmD, BCPS Heart Failure Cytogeneticist Phone [phone]

## 2020-12-22 NOTE — Progress Notes (Incomplete)
Cardiology Office Note:    Date:  12/22/2020   ID:  [name], DOB 11-Jan-1956, MRN [id number]  PCP:  [name], NP  Cardiologist:  Buford Dresser, MD  Electrophysiologist:  None   Referring MD: [name], NP   Chief Complaint: ***  History of Present Illness:    [name] is a 65 y.o. female with a history of ***  Past Medical History:  Diagnosis Date  . Allergic rhinitis   . Arthritis   . Asthma   . Chest pain 12/27/2017  . Chronic diastolic CHF (congestive heart failure) ([place])   . COPD (chronic obstructive pulmonary disease) ([place])   . Depression   . DM (diabetes mellitus) ([place])   . DVT (deep vein thrombosis) in pregnancy   . Dysrhythmia    atrial fibrilation  . GERD (gastroesophageal reflux disease)   . Headache(784.0)   . HTN (hypertension)   . Hyperlipidemia   . Obesity   . Pneumonia 04/2018   RIGHT LOBE  . Shortness of breath   . Sleep apnea    compliant with CPAP    Past Surgical History:  Procedure Laterality Date  . ABDOMINAL HYSTERECTOMY    . BUBBLE STUDY  11/26/2020   Procedure: BUBBLE STUDY;  Surgeon: Buford Dresser, MD;  Location: [place];  Service: Cardiovascular;;  . CARDIOVERSION N/A 05/06/2020   Procedure: CARDIOVERSION;  Surgeon: Buford Dresser, MD;  Location: [facility] ENDOSCOPY;  Service: Cardiovascular;  Laterality: N/A;  . CARDIOVERSION N/A 11/04/2020   Procedure: CARDIOVERSION;  Surgeon: Lelon Perla, MD;  Location: [facility] ENDOSCOPY;  Service: Cardiovascular;  Laterality: N/A;  . CATARACT EXTRACTION, BILATERAL    . CHOLECYSTECTOMY    . COLONOSCOPY WITH PROPOFOL N/A 03/05/2015   Procedure: COLONOSCOPY WITH PROPOFOL;  Surgeon: Carol Ada, MD;  Location: [facility] ENDOSCOPY;  Service: Endoscopy;  Laterality: N/A;  . DIAGNOSTIC LAPAROSCOPY    . ingrown hallux Left   . KNEE SURGERY    . LEFT HEART CATH AND CORONARY ANGIOGRAPHY N/A 12/31/2017   Procedure: LEFT HEART CATH AND CORONARY  ANGIOGRAPHY;  Surgeon: Charolette Forward, MD;  Location: [place] CV LAB;  Service: Cardiovascular;  Laterality: N/A;  . TEE WITHOUT CARDIOVERSION N/A 11/26/2020   Procedure: TRANSESOPHAGEAL ECHOCARDIOGRAM (TEE);  Surgeon: Buford Dresser, MD;  Location: [facility] ENDOSCOPY;  Service: Cardiovascular;  Laterality: N/A;    Current Medications: No outpatient medications have been marked as taking for the 12/27/20 encounter (Appointment) with Darreld Mclean, PA-C.     Allergies:   Sulfa antibiotics   Social History   Socioeconomic History  . Marital status: Widowed    Spouse name: Not on file  . Number of children: Not on file  . Years of education: Not on file  . Highest education level: Not on file  Occupational History  . Not on file  Tobacco Use  . Smoking status: Never Smoker  . Smokeless tobacco: Never Used  Vaping Use  . Vaping Use: Never used  Substance and Sexual Activity  . Alcohol use: No  . Drug use: No  . Sexual activity: Not on file  Other Topics Concern  . Not on file  Social History Narrative   Pt lives in [place] with spouse.  2 grown children.   Previously worked in [employer] at [employer].  Now on disability   Social Determinants of Health   Financial Resource Strain: Not on file  Food Insecurity: Not on file  Transportation Needs: Not on file  Physical Activity: Not on  file  Stress: Not on file  Social Connections: Not on file     Family History: The patient's ***family history includes CAD in an other family member; Cancer in her father and mother; Hypertension in her brother, mother, and sister.  ROS:   Please see the history of present illness.    *** All other systems reviewed and are negative.  EKGs/Labs/Other Studies Reviewed:    The following studies were reviewed today: ***  EKG:  EKG *** ordered today. EKG personally reviewed and demonstrates ***.  Recent Labs: 11/05/2020: Magnesium 2.4 12/19/2020: ALT 12; B Natriuretic Peptide 227.9;  Hemoglobin 12.2; Platelets 187 12/22/2020: BUN 28; Creatinine, Ser 1.94; Potassium 3.8; Sodium 138  Recent Lipid Panel    Component Value Date/Time   CHOL 140 06/24/2020 1054   TRIG 102 06/24/2020 1054   HDL 46 06/24/2020 1054   CHOLHDL 3.0 06/24/2020 1054   CHOLHDL 3.1 12/30/2017 0436   VLDL 20 12/30/2017 0436   LDLCALC 75 06/24/2020 1054    Physical Exam:    Vital Signs: There were no vitals taken for this visit.    Wt Readings from Last 3 Encounters:  12/22/20 286 lb 2.5 oz (129.8 kg)  11/22/20 (!) 307 lb (139.3 kg)  11/05/20 277 lb 1.9 oz (125.7 kg)     General: 65 y.o. female in no acute distress. HEENT: Normocephalic and atraumatic. Sclera clear. EOMs intact. Neck: Supple. No carotid bruits. No JVD. Heart: *** RRR. Distinct S1 and S2. No murmurs, gallops, or rubs. Radial and distal pedal pulses 2+ and equal bilaterally. Lungs: No increased work of breathing. Clear to ausculation bilaterally. No wheezes, rhonchi, or rales.  Abdomen: Soft, non-distended, and non-tender to palpation. Bowel sounds present in all 4 quadrants.  MSK: Normal strength and tone for age. *** Extremities: No lower extremity edema.    Skin: Warm and dry. Neuro: Alert and oriented x3. No focal deficits. Psych: Normal affect. Responds appropriately.   Assessment:    No diagnosis found.  Plan:     Disposition: Follow up in ***   Medication Adjustments/Labs and Tests Ordered: Current medicines are reviewed at length with the patient today.  Concerns regarding medicines are outlined above.  No orders of the defined types were placed in this encounter.  No orders of the defined types were placed in this encounter.   There are no Patient Instructions on file for this visit.   Signed, Darreld Mclean, PA-C  12/22/2020 12:56 PM    [facility]

## 2020-12-22 NOTE — Progress Notes (Addendum)
Progress Note  Patient Name: [name] Date of Encounter: 12/22/2020  Primary Cardiologist: Buford Dresser, MD   Subjective   Doing well today. No chest pain or SOB  Inpatient Medications    Scheduled Meds: . amiodarone  200 mg Oral BID  . apixaban  5 mg Oral BID  . cycloSPORINE  1 drop Both Eyes BID  . digoxin  0.125 mg Oral Daily  . DULoxetine  60 mg Oral Daily  . furosemide  80 mg Intravenous BID  . gabapentin  600 mg Oral BID  . insulin aspart  0-9 Units Subcutaneous TID WC  . insulin detemir  20 Units Subcutaneous BID  . metoprolol succinate  100 mg Oral BID  . potassium chloride  40 mEq Oral Once  . rosuvastatin  10 mg Oral Daily  . sodium chloride flush  3 mL Intravenous Q12H   Continuous Infusions: . sodium chloride     PRN Meds: sodium chloride, acetaminophen, albuterol, menthol-cetylpyridinium, sodium chloride flush   Vital Signs    Vitals:   12/21/20 1700 12/21/20 2001 12/22/20 0356 12/22/20 0856  BP: 135/81 (!) 143/80 139/81 (!) 127/110  Pulse: 76 77 79 76  Resp: 18  18   Temp: (!) 97.5 F (36.4 C)  97.9 F (36.6 C)   TempSrc: Oral  Oral   SpO2: 98% 100% 99%   Weight:   129.8 kg   Height:        Intake/Output Summary (Last 24 hours) at 12/22/2020 0937 Last data filed at 12/22/2020 0900 Gross per 24 hour  Intake 1060 ml  Output 1750 ml  Net -690 ml   Filed Weights   12/20/20 0501 12/21/20 0406 12/22/20 0356  Weight: 129.5 kg 129.8 kg 129.8 kg    Physical Exam   General: Well developed, well nourished, NAD Skin: Warm, dry, intact  Head: Normocephalic, atraumatic, sclera non-icteric, no xanthomas, clear, moist mucus membranes. Neck: Negative for carotid bruits. No JVD Lungs:Clear to ausculation bilaterally. No wheezes, rales, or rhonchi. Breathing is unlabored. Cardiovascular: RRR with S1 S2. No murmurs, rubs, gallops, or LV heave appreciated. Abdomen: Soft, non-tender, non-distended with normoactive bowel sounds. No  hepatomegaly, No rebound/guarding. No obvious abdominal masses. MSK: Strength and tone appear normal for age. 5/5 in all extremities Extremities: No edema. No clubbing or cyanosis. DP/PT pulses 2+ bilaterally Neuro: Alert and oriented. No focal deficits. No facial asymmetry. MAE spontaneously. Psych: Responds to questions appropriately with normal affect.    Labs    Chemistry Recent Labs  Lab 12/19/20 1016 12/20/20 0743 12/21/20 0403 12/22/20 0427  NA 141 140 137 138  K 3.5 3.1* 3.9 3.8  CL 102 97* 99 100  CO2 30 33* 28 24  GLUCOSE 164* 151* 106* 92  BUN 16 15 22  28*  CREATININE 1.68* 1.83* 1.86* 1.94*  CALCIUM 9.8 9.2 9.0 9.0  PROT 7.9  --   --   --   ALBUMIN 3.4*  --   --   --   AST 19  --   --   --   ALT 12  --   --   --   ALKPHOS 87  --   --   --   BILITOT 0.9  --   --   --   GFRNONAA 34* 30* 30* 28*  ANIONGAP 9 10 10 14      Hematology Recent Labs  Lab 12/19/20 1016  WBC 7.1  RBC 4.28  HGB 12.2  HCT 39.4  MCV  92.1  MCH 28.5  MCHC 31.0  RDW 14.3  PLT 187    Cardiac EnzymesNo results for input(s): TROPONINI in the last 168 hours. No results for input(s): TROPIPOC in the last 168 hours.   BNP Recent Labs  Lab 12/19/20 1016  BNP 227.9*     DDimer No results for input(s): DDIMER in the last 168 hours.   Radiology    No results found.  Telemetry    12/22/20 AF with HR in the 70's- Personally Reviewed  ECG    No new tracing as of 12/22/20 - Personally Reviewed  Cardiac Studies   TEE 11/26/20: IMPRESSIONS  1. Left ventricular ejection fraction, by estimation, is 45 to 50%. The  left ventricle has mildly decreased function. The left ventricle  demonstrates global hypokinesis.  2. Right ventricular systolic function is moderately reduced. The right  ventricular size is moderately enlarged.  3. Left atrial size was severely dilated. No left atrial/left atrial  appendage thrombus was detected.  4. Right atrial size was severely dilated.  5. MR  more appreciable early in the study when blood pressure was normal.  MR jet appears less later in the study, likely due to reduction in blood  pressure. Posterior leaflet appears mildly restricted, with override of  anterior leaflet. Systolic flow  reversal seen in right sided pulmonary veins.. The mitral valve is  abnormal. Severe mitral valve regurgitation. The mean mitral valve  gradient is 2.0 mmHg.  6. Tricuspid valve regurgitation is severe.  7. The aortic valve is tricuspid. Aortic valve regurgitation is not  visualized. No aortic stenosis is present.   Conclusion(s)/Recommendation(s): Severe mitral regurgitation (PV systolic  flow reversal) likely due to restricted posterior leaflet and overriding  anterior leaflet. Severe TR.   FINDINGS  Left Ventricle: Left ventricular ejection fraction, by estimation, is 45  to 50%. The left ventricle has mildly decreased function. The left  ventricle demonstrates global hypokinesis. The left ventricular internal  cavity size was normal in size.   Right Ventricle: The right ventricular size is moderately enlarged. No  increase in right ventricular wall thickness. Right ventricular systolic  function is moderately reduced.   Left Atrium: Left atrial size was severely dilated. No left atrial/left  atrial appendage thrombus was detected.   Right Atrium: Right atrial size was severely dilated.   Pericardium: There is no evidence of pericardial effusion.   Mitral Valve: MR more appreciable early in the study when blood pressure  was normal. MR jet appears less later in the study, likely due to  reduction in blood pressure. Posterior leaflet appears mildly restricted,  with override of anterior leaflet.  Systolic flow reversal seen in right sided pulmonary veins. The mitral  valve is abnormal. There is mild thickening of the mitral valve  leaflet(s). There is mild calcification of the mitral valve leaflet(s).  Mild mitral annular  calcification. Severe  mitral valve regurgitation. MV peak gradient, 5.6 mmHg. The mean mitral  valve gradient is 2.0 mmHg.   Tricuspid Valve: The tricuspid valve is normal in structure. Tricuspid  valve regurgitation is severe.   Aortic Valve: The aortic valve is tricuspid. Aortic valve regurgitation is  not visualized. No aortic stenosis is present.   Pulmonic Valve: The pulmonic valve was grossly normal. Pulmonic valve  regurgitation is mild.   Aorta: The aortic root and ascending aorta are structurally normal, with  no evidence of dilitation.   IAS/Shunts: No atrial level shunt detected by color flow Doppler. Agitated  saline contrast  was given intravenously to evaluate for intracardiac  shunting.   Echo 07/09/20 1. Left ventricular ejection fraction, by estimation, is 45 to 50%. The  left ventricle has mildly decreased function. The left ventricle  demonstrates global hypokinesis. Left ventricular diastolic function could  not be evaluated.  2. Right ventricular systolic function was not well visualized. The right  ventricular size is moderately enlarged. There is normal pulmonary artery  systolic pressure.  3. Left atrial size was mildly dilated.  4. Right atrial size was moderately dilated.  5. Eccentric MR jet; appears mild-moderate on parasternal views, but on  apical views (see images 52, 66, 72) extends into distal atrium and may  have Coanda effect. Incompletely visualized and PV not sampled, suspect  moderate to severe MR.. The mitral  valve is rheumatic. Moderate to severe mitral valve regurgitation.  6. Tricuspid valve regurgitation is severe.  7. The aortic valve is tricuspid. Aortic valve regurgitation is not  visualized. No aortic stenosis is present.  8. The inferior vena cava is normal in size with <50% respiratory  variability, suggesting right atrial pressure of 8 mmHg.   Echo 07/02/2018 - Left ventricle: The cavity size was normal. Systolic  function was  mildly reduced. The estimated ejection fraction was in the range  of 45% to 50%. There is moderate hypokinesis of the  entireanteroseptal myocardium.  - Aortic valve: There was mild regurgitation.  - Mitral valve: Mildly dilated, calcified annulus. There was severe  regurgitation.  - Left atrium: The atrium was severely dilated.  - Right ventricle: The cavity size was mildly dilated. Wall  thickness was normal. Systolic function was mildly to moderately  reduced.  - Right atrium: The atrium was severely dilated.  - Tricuspid valve: There was severe regurgitation.  - Pulmonary arteries: Systolic pressure was mildly increased. PA  peak pressure: 31 mm Hg (S).   Cath 12/31/2017  Prox LAD lesion is 10% stenosed.  Dist LM to Ost LAD lesion is 10% stenosed.  Ost Cx lesion is 15% stenosed.  The left ventricular systolic function is normal.  LV end diastolic pressure is normal.   Patient Profile     65 y.o. female with a hx of chronic diastolic and systolic HF with mildly reduced LVEF (45-50%), severe MR, atrial fibrillation on eliquis, HTN, and DMIIwho presented with worsening SOB, weight gain and LE edema found to have elevated BNP and pulmonary edema on chest xray consistent with acute on chronic diastolic HF exacerbation.  Assessment & Plan    1.  Acute on chronic combined systolic and diastolic CHF: -Patient presented with worsening shortness of breath and weight gain with LE edema found to have a BNP at 2237 and pulmonary edema on CXR -Echocardiogram from 06/2020 with LVEF of 45 to 50% with global hypokinesis, moderate RV enlargement, mild LAE, moderate RAE, severe MR and severe TR with NYHA class III symptoms -Continue with IV Lasix 80 mg twice daily, Toprol XL 100 mg twice daily -Plan to add spironolactone, SGLT2 as renal function allows -Consider RHC to fully assess volume status -Weight, 286lb within admission weight at 285llb  -I&O, net -949  mL  2.  Persistent atrial fibrillation: -Has a history of failed Tikosyn load in the past with brief conversion to NSR however back to atrial fibrillation felt to likely be secondary to severe MR -Continue anticoagulation with Eliquis, amiodarone 200 mg twice daily, digoxin and Toprol-XL 100 mg twice daily -Likely candidate for Maze procedure with MVR however this will be  determined by thoracic surgery  3.  Severe MR/severe TR: -TEE with confirmed severe MR awaiting CV surgery evaluation in the outpatient setting -Plan as above  4.  HLD: -Continue Crestor 10 mg daily  5.  DM2: -Hemoglobin A1c, 9.3 -SSI for glucose control -Management per primary team  6.  CKD stage IV: -Baseline creatinine appears to be in the 1.6-1.7 range -Creatinine, 1.94 today which is up from 1.86 yesterday   Signed, Kathyrn Drown NP-C [facility] Pager: [phone] 12/22/2020, 9:37 AM     For questions or updates, please contact   Please consult [email or web address] for contact info under Cardiology/STEMI.  Patient seen and examined and agree with Kathyrn Drown NP-C as detailed above.  In brief, the patient is a with a 65 year old female with history of chronic diastolic and systolic HF with mildly reduced LVEF (45-50%), severe MR, atrial fibrillation on eliquis, HTN, and DMIIwho presented with worsening SOB, weight gain and LE edema found to have elevated BNP and pulmonary edema on chest xray consistent with acute on chronic diastolic HF exacerbation.  Wt has been stable despite diuresis. Cr rising slightly. Symptoms overall improved, however, volume status difficult to determine. Will plan for RHC for further evaluation.  GEN: No acute distress.   Neck: Has prominent v-waves in JVD in setting of known severe TR Cardiac: Irregular, 2/6 systolic murmur Respiratory: Clear to auscultation bilaterally. GI: Soft, nontender, non-distended  MS: Trace, nonpitting edema Neuro:  Nonfocal  Psych: Normal affect    Plan: -Check RHC today to better assess filling pressures  -Will adjust diuretic dosing pending RHC findings -Continue metop 100mg  XL BID -Continue apixaban for [facility] -Plan for surgical evaluation for MVR given known severe MR which is likely contributing to acute on chronic HF exacerbation -Monitor I/Os and daily standing weights  INFORMED CONSENT: I have reviewed the risks, indications, and alternatives to right heart catheterization. Risks include but are not limited to bleeding, infection, vascular injury, arrhythmia, kidney injury, radiation-related injury in the case of prolonged fluoroscopy use, emergency cardiac surgery, and death. The patient understands the risks and is amenable to proceed.   Gwyndolyn Kaufman, MD

## 2020-12-22 NOTE — Progress Notes (Signed)
Spoke to IV team about PICC placement. RN reassured me that they will place PICC when they are able to. Milrinone has been started, unable to grab co-ox until PICC is placed.

## 2020-12-22 NOTE — Interval H&P Note (Signed)
History and Physical Interval Note:  12/22/2020 12:38 PM  [name]  has presented today for surgery, with the diagnosis of heart failure.  The various methods of treatment have been discussed with the patient and family. After consideration of risks, benefits and other options for treatment, the patient has consented to  Procedure(s): RIGHT HEART CATH (N/A) as a surgical intervention.  The patient's history has been reviewed, patient examined, no change in status, stable for surgery.  I have reviewed the patient's chart and labs.  Questions were answered to the patient's satisfaction.     Shadrick Senne [employer]

## 2020-12-22 NOTE — Progress Notes (Signed)
[place] numbers reviewed as detailed below.  RA mean 16 with prominent v waves to 22 RV 44/10 PA 44/15, mean 29 PCWP mean 21 with prominent v waves to 35  Oxygen saturations: PA 58% AO 100%  Cardiac Output (Fick) 4.5  Cardiac Index (Fick) 1.91 PVR 1.8 WU  PAPi 1.8  Elevated filling pressures, low cardiac index, low PAPi.  Will start low dose milrinone for now to aide with diuresis given mildly low output state with RV dysfunction. Continue lasix 80mg  IV BID for now and can adjust as needed pending response with milrinone.    Gwyndolyn Kaufman, MD

## 2020-12-22 NOTE — Progress Notes (Signed)
Family Medicine Teaching Service Daily Progress Note Intern Pager: [phone]  Patient name: [name] Medical record number: [id number] Date of birth: 01-13-1956 Age: 65 y.o. Gender: female  Primary Care Provider: [name], NP Consultants: Cardiology Code Status: FULL  Pt Overview and Major Events to Date:  5/1: Admitted  Assessment and Plan: [name] a 65 y.o.femalewho presented with SOB andchest pain concerning for CHF exacerbation. PMH is significant forHFmrEF with EF 45-50%, atrial fibrillation on anticoagulation, GERD, HTN,and T2DM.  Combined systolic and diastolic CHF Exacerbation  Electrolytes stable with diuresis. Creatinine bumped 1.86>1.94, GFR 30>28. With 1750cc UOP yesterday, total neg -1,189cc since admission. Weight unchanged. -Cardiology following, appreciate recommendations -Continue Lasix 80 mg BID; would consider increasing diuresis -Continue Toprol-XL 100 mg BID  -Add Spiro/SGLT-2 as able; likely not during this admission given creatinine continues to elevate   -Fluid restriction 1200cc  Severe Mitral Regurgitation Confirmed with TEE last month. Scheduled for CV surgery outpatient.   Persistent Atrial Fibrillation  HR 70's-90's. On AC. Severe MR likely contributing. Digoxin level 0.8, WNL.  -Cardiology following, appreciate recommendations -Continue home medications: Digoxin, metoprolol succinate, amiodarone, eliquis -Keep K>4, Mg>2  T2DM with Neuropathy: Chronic, stable CBG's 92-158 in last 24 hours.  -CBG's AC/qHS -Continue insulin detemir 20U BID -sSSI  -Continue crestor 10 mg daily  -Continue Gabapentin 600 mg BID   HTN: Chronic, stable BP's ranging 135-143/80-81.  -Continue Toprol-XL 100 mg BID  -Monitor BP's   HLD: chronic, stable -Continue crestor 10 mg daily   GERD: Chronic, stable -Can add back home medication of alka-seltzer as needed.   Depression: chronic, stable -Continue Duloxetine 60  mg daily   FEN/GI: Heart healthy/carb modified PPx: Eliquis   Status is: Inpatient  Remains inpatient appropriate because:IV treatments appropriate due to intensity of illness or inability to take PO   Dispo: The patient is from: Home              Anticipated d/c is to: Home              Patient currently is not medically stable to d/c.   Difficult to place patient No   Subjective:  Patient feels "okay" this morning.  She tells me about a cough that she has had and is wondering if it is related to the extra fluid in her lungs. Her fear is that she will go home and have to come back. She mentions she is still having slight abdominal pain and feels she needs to go to the bathroom.   Objective: Temp:  [97.5 F (36.4 C)-97.9 F (36.6 C)] 97.9 F (36.6 C) (05/04 0356) Pulse Rate:  [76-79] 79 (05/04 0356) Resp:  [18] 18 (05/04 0356) BP: (135-143)/(80-81) 139/81 (05/04 0356) SpO2:  [98 %-100 %] 99 % (05/04 0356) Weight:  [129.8 kg] 129.8 kg (05/04 0356) Physical Exam: General: Awake, alert, in no distress Cardiovascular: Irregularly irregular, rate controlled Respiratory: Faint crackles appreciated in the right lower base, breathing comfortably on room air, in no distress Extremities: non-pitting edema b/l, 2+ DP pulses  Laboratory: Recent Labs  Lab 12/19/20 1016  WBC 7.1  HGB 12.2  HCT 39.4  PLT 187   Recent Labs  Lab 12/19/20 1016 12/20/20 0743 12/21/20 0403 12/22/20 0427  NA 141 140 137 138  K 3.5 3.1* 3.9 3.8  CL 102 97* 99 100  CO2 30 33* 28 24  BUN 16 15 22  28*  CREATININE 1.68* 1.83* 1.86* 1.94*  CALCIUM 9.8 9.2 9.0 9.0  PROT  7.9  --   --   --   BILITOT 0.9  --   --   --   ALKPHOS 87  --   --   --   ALT 12  --   --   --   AST 19  --   --   --   GLUCOSE 164* 151* 106* 92   Imaging/Diagnostic Tests: No results found.   Sharion Settler, DO 12/22/2020, 6:06 AM PGY-1, [place] Intern pager: [phone], text pages welcome

## 2020-12-22 NOTE — Progress Notes (Signed)
Heart Failure Nurse Navigator Progress Note  PCP: [name], NP PCP-Cardiologist: Venida Jarvis., MD Admission Diagnosis: A/C CHF Admitted from: home with daughter and 3 grandchildren  Presentation:   [name] presented with SOB and CP. RHC completed today showing low cardiac index and elevated filling pressures. Milrinone infusing through PIV at time of interview, awaiting IV team to place PICC line. Pt laying in bed during interview process, pt very interactive but would also need to be brought back to original questions. Pt lives with adult daughter and her grandchildren. She and daughter split bills and both are on the rental agreement for their apartment. Pt states she gets SSI and food stamps but unsure of the exact numbers. Has strong familial history of cardiac issues per pt statement.   ECHO/ LVEF: 40-45%  Clinical Course:  Past Medical History:  Diagnosis Date  . Allergic rhinitis   . Arthritis   . Asthma   . Chest pain 12/27/2017  . Chronic diastolic CHF (congestive heart failure) ([place])   . COPD (chronic obstructive pulmonary disease) ([place])   . Depression   . DM (diabetes mellitus) ([place])   . DVT (deep vein thrombosis) in pregnancy   . Dysrhythmia    atrial fibrilation  . GERD (gastroesophageal reflux disease)   . Headache(784.0)   . HTN (hypertension)   . Hyperlipidemia   . Obesity   . Pneumonia 04/2018   RIGHT LOBE  . Shortness of breath   . Sleep apnea    compliant with CPAP     Social History   Socioeconomic History  . Marital status: Significant Other    Spouse name: Not on file  . Number of children: 2  . Years of education: Not on file  . Highest education level: Some college, no degree  Occupational History  . Not on file  Tobacco Use  . Smoking status: Never Smoker  . Smokeless tobacco: Never Used  Vaping Use  . Vaping Use: Never used  Substance and Sexual Activity  . Alcohol use: No  . Drug use: No  . Sexual activity:  Not Currently  Other Topics Concern  . Not on file  Social History Narrative   Pt lives in [place] with spouse.  2 grown children.   Previously worked in [employer] at [employer].  Now on disability   Social Determinants of Health   Financial Resource Strain: Medium Risk  . Difficulty of Paying Living Expenses: Somewhat hard  Food Insecurity: No Food Insecurity  . Worried About Charity fundraiser in the Last Year: Never true  . Ran Out of Food in the Last Year: Never true  Transportation Needs: Unmet Transportation Needs  . Lack of Transportation (Medical): Yes  . Lack of Transportation (Non-Medical): No  Physical Activity: Not on file  Stress: Not on file  Social Connections: Not on file    High Risk Criteria for Readmission and/or Poor Patient Outcomes:  Heart failure hospital admissions (last 6 months): 1   No Show rate: 14%  Difficult social situation: no  Demonstrates medication adherence: yes  Primary Language: English  Literacy level: able to read/write and comprehend  Education Assessment and Provision:  Detailed education and instructions provided on heart failure disease management including the following:  Signs and symptoms of Heart Failure When to call the physician Importance of daily weights Low sodium diet Fluid restriction Medication management Anticipated future follow-up appointments  Patient education given on each of the above topics.  Patient acknowledges understanding  via teach back method and acceptance of all instructions.  Education Materials:  "Living Better With Heart Failure" Booklet, HF zone tool, & Daily Weight Tracker Tool.  Patient has scale at home: yes Patient has pill box at home: yes   Barriers of Care:   -lifestyle modifications  Considerations/Referrals:   Referral made to Heart Failure Pharmacist Stewardship: yes, appreciated Referral made to Heart & Vascular TOC clinic: pending-will place closer to DC date  depending  Items for Follow-up on DC/TOC: -medication optimization -dietary/fluid modifications  Pricilla Holm, RN, BSN Heart Failure Nurse Navigator [phone]

## 2020-12-22 NOTE — Progress Notes (Signed)
   12/22/20 1013  Mobility  Activity Refused mobility (Pt declined. Just received lasix, afraid of having accident. Offered solutions but patient still declined. Will check back as time permits)

## 2020-12-23 ENCOUNTER — Inpatient Hospital Stay (HOSPITAL_COMMUNITY): Payer: Medicare Other

## 2020-12-23 ENCOUNTER — Ambulatory Visit: Payer: Medicare Other | Admitting: Cardiology

## 2020-12-23 DIAGNOSIS — I340 Nonrheumatic mitral (valve) insufficiency: Secondary | ICD-10-CM

## 2020-12-23 DIAGNOSIS — I361 Nonrheumatic tricuspid (valve) insufficiency: Secondary | ICD-10-CM

## 2020-12-23 DIAGNOSIS — I4821 Permanent atrial fibrillation: Secondary | ICD-10-CM | POA: Diagnosis not present

## 2020-12-23 DIAGNOSIS — I509 Heart failure, unspecified: Secondary | ICD-10-CM | POA: Diagnosis not present

## 2020-12-23 LAB — BASIC METABOLIC PANEL
Anion gap: 6 (ref 5–15)
BUN: 32 mg/dL — ABNORMAL HIGH (ref 8–23)
CO2: 30 mmol/L (ref 22–32)
Calcium: 8.8 mg/dL — ABNORMAL LOW (ref 8.9–10.3)
Chloride: 100 mmol/L (ref 98–111)
Creatinine, Ser: 2 mg/dL — ABNORMAL HIGH (ref 0.44–1.00)
GFR, Estimated: 27 mL/min — ABNORMAL LOW (ref >60)
Glucose, Bld: 136 mg/dL — ABNORMAL HIGH (ref 70–99)
Potassium: 3.3 mmol/L — ABNORMAL LOW (ref 3.5–5.1)
Sodium: 136 mmol/L (ref 135–145)

## 2020-12-23 LAB — GLUCOSE, CAPILLARY
Glucose-Capillary: 127 mg/dL — ABNORMAL HIGH (ref 70–99)
Glucose-Capillary: 177 mg/dL — ABNORMAL HIGH (ref 70–99)
Glucose-Capillary: 109 mg/dL — ABNORMAL HIGH (ref 70–99)
Glucose-Capillary: 173 mg/dL — ABNORMAL HIGH (ref 70–99)

## 2020-12-23 LAB — COOXEMETRY PANEL
Carboxyhemoglobin: 1.4 % (ref 0.5–1.5)
Methemoglobin: 0.8 % (ref 0.0–1.5)
O2 Saturation: 63.8 %
Total hemoglobin: 11.1 g/dL — ABNORMAL LOW (ref 12.0–16.0)

## 2020-12-23 LAB — TROPONIN I (HIGH SENSITIVITY)
Troponin I (High Sensitivity): 6 ng/L (ref <18)
Troponin I (High Sensitivity): 6 ng/L (ref <18)

## 2020-12-23 MED ORDER — POTASSIUM CHLORIDE CRYS ER 20 MEQ PO TBCR: 80.0000 meq | EXTENDED_RELEASE_TABLET | Freq: Once | ORAL | Status: DC

## 2020-12-23 MED ORDER — POLYETHYLENE GLYCOL 3350 17 G PO PACK
17.0000 g | PACK | Freq: Every day | ORAL | Status: DC | PRN
  Administered 2020-12-23 – 2020-12-24 (×2): 17 g via ORAL
  Filled 2020-12-23 (×2): qty 1

## 2020-12-23 MED ORDER — METOLAZONE 2.5 MG PO TABS
2.5000 mg | ORAL_TABLET | Freq: Once | ORAL | Status: AC
  Administered 2020-12-23: 2.5 mg via ORAL
  Filled 2020-12-23: qty 1

## 2020-12-23 MED ORDER — SODIUM CHLORIDE 0.9% FLUSH
10.0000 mL | INTRAVENOUS | Status: DC | PRN
Start: 2020-12-23 — End: 2020-12-29

## 2020-12-23 MED ORDER — CHLORHEXIDINE GLUCONATE CLOTH 2 % EX PADS
6.0000 | MEDICATED_PAD | Freq: Every day | CUTANEOUS | Status: DC
  Administered 2020-12-23 – 2020-12-28 (×6): 6 via TOPICAL

## 2020-12-23 MED ORDER — SODIUM CHLORIDE 0.9% FLUSH
10.0000 mL | Freq: Two times a day (BID) | INTRAVENOUS | Status: DC
  Administered 2020-12-23 – 2020-12-28 (×6): 10 mL

## 2020-12-23 MED ORDER — POTASSIUM CHLORIDE CRYS ER 20 MEQ PO TBCR
40.0000 meq | EXTENDED_RELEASE_TABLET | Freq: Two times a day (BID) | ORAL | Status: AC
  Administered 2020-12-23 (×2): 40 meq via ORAL
  Filled 2020-12-23 (×2): qty 2

## 2020-12-23 MED ORDER — POTASSIUM CHLORIDE CRYS ER 10 MEQ PO TBCR: 40.0000 meq | EXTENDED_RELEASE_TABLET | Freq: Once | ORAL | Status: DC

## 2020-12-23 NOTE — Progress Notes (Signed)
Physical Therapy Treatment Patient Details Name: [name] MRN: [id number] DOB: Jul 07, 1956 Today's Date: 12/23/2020    History of Present Illness Patient is a 65 y/o female who presents on 12/19/20 with SOB, chest pain, N/V and emesis. Admitted with CHF exacerbation. PMH includes HTN. DM2, A-fib, CHF, OSA, DVT, COPD.    PT Comments    Pt demonstrates ability to ambulate for a longer distance with a rollator compared to last session. Pt does not require physical assistance to perform transfers and with ambulation activities at this time, but remains limited by activity tolerance and WOB. Pt reports needing to ambulate for long distances to access her apartment from her parking lot. Pt will continue to benefit from acute PT to address remaining deficits in strength, endurance, power, activity tolerance and safe and independent mobility. Pt will benefit from stair training at next session.  Follow Up Recommendations  Home health PT;Supervision - Intermittent     Equipment Recommendations  None recommended by PT    Recommendations for Other Services       Precautions / Restrictions Precautions Precautions: Other (comment);Fall Precaution Comments: watch 02, hx of falls Restrictions Weight Bearing Restrictions: No    Mobility  Bed Mobility               General bed mobility comments: Pt in recliner upon PT arrival.    Transfers Overall transfer level: Needs assistance Equipment used: None Transfers: Sit to/from Stand Sit to Stand: Min guard         General transfer comment: min G for safety. Pt experiences 1 LOB but recovers quickly.  Ambulation/Gait Ambulation/Gait assistance: Min guard Gait Distance (Feet): 500 Feet Assistive device: IV Pole;4-wheeled walker (IV pole to door, switched to rollator for hall.) Gait Pattern/deviations: Step-through pattern;Decreased stride length;Wide base of support Gait velocity: decreased Gait velocity interpretation:  <1.31 ft/sec, indicative of household ambulator General Gait Details: Pt ambulates at reduced speed, reporting that her baseline gait speed is faster.   Stairs             Wheelchair Mobility    Modified Rankin (Stroke Patients Only)       Balance Overall balance assessment: History of Falls;Needs assistance Sitting-balance support: Feet supported;No upper extremity supported Sitting balance-Leahy Scale: Good     Standing balance support: During functional activity;No upper extremity supported;Single extremity supported;Bilateral upper extremity supported Standing balance-Leahy Scale: Fair Standing balance comment: Pt able to stand without UE to don mask.                            Cognition Arousal/Alertness: Awake/alert Behavior During Therapy: WFL for tasks assessed/performed Overall Cognitive Status: Within Functional Limits for tasks assessed                                        Exercises      General Comments General comments (skin integrity, edema, etc.): Pt denies symptoms with gait, but demonstrates increased WOB by end of ambulation requesting to return to room.      Pertinent Vitals/Pain Pain Assessment: 0-10 Pain Score: 5  Pain Location: back, legs Pain Descriptors / Indicators: Grimacing;Discomfort Pain Intervention(s): Monitored during session;Limited activity within patient's tolerance    Home Living                      Prior Function  PT Goals (current goals can now be found in the care plan section) Acute Rehab PT Goals Patient Stated Goal: To be able to walk longer distances again. Progress towards PT goals: Progressing toward goals    Frequency    Min 3X/week      PT Plan Current plan remains appropriate    Co-evaluation              AM-PAC PT "6 Clicks" Mobility   Outcome Measure  Help needed turning from your back to your side while in a flat bed without using  bedrails?: None Help needed moving from lying on your back to sitting on the side of a flat bed without using bedrails?: None Help needed moving to and from a bed to a chair (including a wheelchair)?: A Little Help needed standing up from a chair using your arms (e.g., wheelchair or bedside chair)?: A Little Help needed to walk in hospital room?: A Little Help needed climbing 3-5 steps with a railing? : A Lot 6 Click Score: 19    End of Session Equipment Utilized During Treatment: Gait belt Activity Tolerance: Patient tolerated treatment well Patient left: in chair;with call bell/phone within reach;with chair alarm set Nurse Communication: Mobility status PT Visit Diagnosis: Pain;Muscle weakness (generalized) (M62.81);Unsteadiness on feet (R26.81);Difficulty in walking, not elsewhere classified (R26.2) Pain - Right/Left:  (bil LE and back) Pain - part of body: Leg (back)     Time: 3888-2800 PT Time Calculation (min) (ACUTE ONLY): 19 min  Charges:  $Therapeutic Activity: 8-22 mins                     Acute Rehab  Pager: [phone]    Garwin Brothers, SPT  12/23/2020, 5:17 PM

## 2020-12-23 NOTE — Progress Notes (Addendum)
Family Medicine Teaching Service Daily Progress Note Intern Pager: [phone]  Patient name: [name] Medical record number: [id number] Date of birth: [birth year] Age: 65 y.o. Gender: female  Primary Care Provider: [name], NP Consultants: Cardiology Code Status: FULL  Pt Overview and Major Events to Date:  5/1: Admitted 5/4: RHC   Assessment and Plan: Shaily Librizzi a 65 y.o.femalewho presented with CHF exacerbation. PMH is significant forHFmrEF with EF 45-50%, atrial fibrillation on anticoagulation, GERD, HTN,and T2DM.  Combined systolic and diastolic CHF Exacerbation  Had RHC yesterday which revealed significant RV dysfunction and significant MR and TR. Started on low-dose Milrinone and Lasix continued at 80 mg BID. Creatinine bumped 1.86>1.94>2, GFR 30>28>27. 1908cc UOP yesterday, total neg -3,097cc since admission. Weight down 1.55kg. Successful left arm PICC placed this morning. -Cardiology following, appreciate recommendations -Continue Lasix 80 mg BID -Stopped Toprol-XL  -Add Spiro/SGLT-2 as able; likely not during this admission given creatinine continues to elevate  -Fluid restriction 1200cc -40 mEq K+ supplementation x2  Chest Pain Patient complained of 8/10 chest pain overnight. She received Tylenol without relief. Overnight MD paged and evaluated patient. EKG revealed Afib without RVR. Reassuringly Trop 6>6. She has had chest pain since admission but states this episode appeared to feel different.  She tells me that she still has a to 10 chest pain that she does not appear to be in distress, speaking clearly, without respiratory distress.  -Monitor signs/symptoms  Severe Mitral Regurgitation Confirmed with TEE last month. Plan for CV surgery outpatient.  -Will contact CVTS to see if inpatient evaluation/surgical intervention would be possible   Persistent Atrial Fibrillation on AC HR60's-80's.On AC. Severe MR likely  contributing.Digoxin level 0.8, WNL.  -Cardiology following, appreciate recommendations -Continue home medications: Digoxin, metoprolol succinate, amiodarone, eliquis -Keep K>4, Mg>2  T2DM with Neuropathy: Chronic, stable CBG's 103-144 in last 24 hours.  -D/c CBG's AC/qHS given good glucose control throughout admission  -D/c sSSI  -Continue insulin detemir 20U BID -Continue crestor 10 mg daily  -Continue Gabapentin 600 mg BID   HTN: Chronic, stable BP's ranging 93-176/62-130 in last 24 hours. Elevated pressures likely during RHC yesterday. Most recently normotensive, 122/75. -Continue Toprol-XL 100 mg BID  -Monitor BP's   HLD: chronic, stable -Continue crestor 10 mg daily   GERD: Chronic, stable -Can add back home medication of alka-seltzer as needed.  Depression: chronic, stable -Continue Duloxetine 60 mg daily   FEN/GI:Heart healthy/carb modified YYQ:MGNOIBB    FEN/GI: Heart healthy/carb modified with 1200 mL fluid restriction PPx: Eliquis   Status is: Inpatient  Remains inpatient appropriate because:IV treatments appropriate due to intensity of illness or inability to take PO   Dispo: The patient is from: Home              Anticipated d/c is to: Home              Patient currently is not medically stable to d/c.   Difficult to place patient No   Subjective:  Patient tells me about the chest pain she experienced last night.  Describes it as achy.  She continues to state that it is 8 out of 10 achy pain.  Denies difficulty breathing.  Also has right leg discomfort.  Overall though she does feel that she is improving.  States that she did not get up and walk yesterday since it was right after her Lasix dose and she was starting to pee a lot.  She is amenable to get up and walk and states that  she had only requested that her walk be postponed yesterday, wants to make it clear that she did not deny it.  Objective: Temp:  [97.6 F (36.4 C)-97.9 F (36.6  C)] 97.6 F (36.4 C) (05/05 0405) Pulse Rate:  [59-142] 76 (05/05 0405) Resp:  [16-94] 18 (05/05 0405) BP: (93-176)/(62-130) 122/75 (05/05 0405) SpO2:  [96 %-100 %] 98 % (05/05 0405) Weight:  [129.1 kg] 129.1 kg (05/05 0405) Physical Exam: General: Awake, alert, no distress Cardiovascular: Irregularly irregular rhythm, rate controlled Respiratory: CTA B without wheezing/rhonchi/rales, no respiratory distress, breathing comfortably on room air Extremities: Nonpitting edema to bilateral lower extremities, warm and dry  Laboratory: Recent Labs  Lab 12/19/20 1016 12/22/20 1256  WBC 7.1  --   HGB 12.2 13.3  12.9  HCT 39.4 39.0  38.0  PLT 187  --    Recent Labs  Lab 12/19/20 1016 12/20/20 0743 12/21/20 0403 12/22/20 0427 12/22/20 1256 12/23/20 0450  NA 141   < > 137 138 141  142 136  K 3.5   < > 3.9 3.8 3.8  3.7 3.3*  CL 102   < > 99 100  --  100  CO2 30   < > 28 24  --  30  BUN 16   < > 22 28*  --  32*  CREATININE 1.68*   < > 1.86* 1.94*  --  2.00*  CALCIUM 9.8   < > 9.0 9.0  --  8.8*  PROT 7.9  --   --   --   --   --   BILITOT 0.9  --   --   --   --   --   ALKPHOS 87  --   --   --   --   --   ALT 12  --   --   --   --   --   AST 19  --   --   --   --   --   GLUCOSE 164*   < > 106* 92  --  136*   < > = values in this interval not displayed.    Imaging/Diagnostic Tests: CARDIAC CATHETERIZATION  Result Date: 12/22/2020 1. Elevated R > L heart filling pressures with PAPi low but not markedly so (1.8) suggestive of a significant component of RV dysfunction. 2. Prominent v-waves in PCWP and RA pressure tracings suggestive of significant MR and TR. 3. Cardiac index low.   DG CHEST PORT 1 VIEW  Result Date: 12/23/2020 CLINICAL DATA:  PICC placement EXAM: PORTABLE CHEST 1 VIEW COMPARISON:  03/02/2020 FINDINGS: Cardiomegaly. Artifact from EKG leads. Right-sided PICC with tip at the SVC. Interstitial prominence without Kerley lines, effusion, or pneumothorax. IMPRESSION: New  PICC with tip at the SVC. Electronically Signed   By: Monte Fantasia M.D.   On: 12/23/2020 09:08   [place] EKG SITE RITE  Result Date: 12/22/2020 If Site Rite image not attached, placement could not be confirmed due to current cardiac rhythm.    Sharion Settler, DO 12/23/2020, 6:09 AM PGY-1, [place] Intern pager: [phone], text pages welcome

## 2020-12-23 NOTE — Progress Notes (Addendum)
Progress Note  Patient Name: [name] No Date of Encounter: 12/23/2020  Primary Cardiologist: Buford Dresser, MD  Subjective   PICC line placed. No complaints today although had an episode of chest pressure overnight>>HsT 6 and 6. Great diuresis overnight with milrinone.    Inpatient Medications    Scheduled Meds: . amiodarone  200 mg Oral BID  . apixaban  5 mg Oral BID  . cycloSPORINE  1 drop Both Eyes BID  . digoxin  0.125 mg Oral Daily  . DULoxetine  60 mg Oral Daily  . furosemide  80 mg Intravenous BID  . gabapentin  600 mg Oral BID  . insulin aspart  0-9 Units Subcutaneous TID WC  . insulin detemir  20 Units Subcutaneous BID  . potassium chloride  40 mEq Oral Once  . rosuvastatin  10 mg Oral Daily  . sodium chloride flush  3 mL Intravenous Q12H  . sodium chloride flush  3 mL Intravenous Q12H   Continuous Infusions: . sodium chloride    . milrinone 0.125 mcg/kg/min (12/23/20 0331)   PRN Meds: sodium chloride, acetaminophen, albuterol, menthol-cetylpyridinium, sodium chloride flush   Vital Signs    Vitals:   12/22/20 1600 12/22/20 1840 12/22/20 2223 12/23/20 0405  BP: 118/68 93/62 114/64 122/75  Pulse: 67 85 81 76  Resp:  20 18 18   Temp:   97.8 F (36.6 C) 97.6 F (36.4 C)  TempSrc:   Oral   SpO2:  98% 96% 98%  Weight:    129.1 kg  Height:        Intake/Output Summary (Last 24 hours) at 12/23/2020 0727 Last data filed at 12/23/2020 0646 Gross per 24 hour  Intake 616.34 ml  Output 2900 ml  Net -2283.66 ml   Filed Weights   12/21/20 0406 12/22/20 0356 12/23/20 0405  Weight: 129.8 kg 129.8 kg 129.1 kg    Physical Exam   General: Obese, NAD Neck: Negative for carotid bruits. No JVD Lungs:Clear to ausculation bilaterally. No wheezes, rales, or rhonchi. Breathing is unlabored. Cardiovascular: Irregularly irregular. + murmurs Abdomen: Soft, non-tender, non-distended. No obvious abdominal masses. Extremities: No edema. Radial pulses  2+ bilaterally Neuro: Alert and oriented. No focal deficits. No facial asymmetry. MAE spontaneously. Psych: Responds to questions appropriately with normal affect.    Labs    Chemistry Recent Labs  Lab 12/19/20 1016 12/20/20 0743 12/21/20 0403 12/22/20 0427 12/22/20 1256 12/23/20 0450  NA 141   < > 137 138 141  142 136  K 3.5   < > 3.9 3.8 3.8  3.7 3.3*  CL 102   < > 99 100  --  100  CO2 30   < > 28 24  --  30  GLUCOSE 164*   < > 106* 92  --  136*  BUN 16   < > 22 28*  --  32*  CREATININE 1.68*   < > 1.86* 1.94*  --  2.00*  CALCIUM 9.8   < > 9.0 9.0  --  8.8*  PROT 7.9  --   --   --   --   --   ALBUMIN 3.4*  --   --   --   --   --   AST 19  --   --   --   --   --   ALT 12  --   --   --   --   --   ALKPHOS 87  --   --   --   --   --  BILITOT 0.9  --   --   --   --   --   GFRNONAA 34*   < > 30* 28*  --  27*  ANIONGAP 9   < > 10 14  --  6   < > = values in this interval not displayed.     Hematology Recent Labs  Lab 12/19/20 1016 12/22/20 1256  WBC 7.1  --   RBC 4.28  --   HGB 12.2 13.3  12.9  HCT 39.4 39.0  38.0  MCV 92.1  --   MCH 28.5  --   MCHC 31.0  --   RDW 14.3  --   PLT 187  --     Cardiac EnzymesNo results for input(s): TROPONINI in the last 168 hours. No results for input(s): TROPIPOC in the last 168 hours.   BNP Recent Labs  Lab 12/19/20 1016  BNP 227.9*     DDimer No results for input(s): DDIMER in the last 168 hours.   Radiology    CARDIAC CATHETERIZATION  Result Date: 12/22/2020 1. Elevated R > L heart filling pressures with PAPi low but not markedly so (1.8) suggestive of a significant component of RV dysfunction. 2. Prominent v-waves in PCWP and RA pressure tracings suggestive of significant MR and TR. 3. Cardiac index low.   [place] EKG SITE RITE  Result Date: 12/22/2020 If Site Rite image not attached, placement could not be confirmed due to current cardiac rhythm.  Telemetry    12/23/20 AF with HR in the 60-80's  - Personally  Reviewed  ECG    No new tracing as of 12/23/20- Personally Reviewed  Cardiac Studies   RHC 12/22/20:  1. Elevated R > L heart filling pressures with PAPi low but not markedly so (1.8) suggestive of a significant component of RV dysfunction.  2. Prominent v-waves in PCWP and RA pressure tracings suggestive of significant MR and TR.  3. Cardiac index low.   Echo 11/26/20:  1. Left ventricular ejection fraction, by estimation, is 45 to 50%. The  left ventricle has mildly decreased function. The left ventricle  demonstrates global hypokinesis.  2. Right ventricular systolic function is moderately reduced. The right  ventricular size is moderately enlarged.  3. Left atrial size was severely dilated. No left atrial/left atrial  appendage thrombus was detected.  4. Right atrial size was severely dilated.  5. MR more appreciable early in the study when blood pressure was normal.  MR jet appears less later in the study, likely due to reduction in blood  pressure. Posterior leaflet appears mildly restricted, with override of  anterior leaflet. Systolic flow  reversal seen in right sided pulmonary veins.. The mitral valve is  abnormal. Severe mitral valve regurgitation. The mean mitral valve  gradient is 2.0 mmHg.  6. Tricuspid valve regurgitation is severe.  7. The aortic valve is tricuspid. Aortic valve regurgitation is not  visualized. No aortic stenosis is present.   Echo 07/09/20:  1. Left ventricular ejection fraction, by estimation, is 45 to 50%. The  left ventricle has mildly decreased function. The left ventricle  demonstrates global hypokinesis. Left ventricular diastolic function could  not be evaluated.  2. Right ventricular systolic function was not well visualized. The right  ventricular size is moderately enlarged. There is normal pulmonary artery  systolic pressure.  3. Left atrial size was mildly dilated.  4. Right atrial size was moderately dilated.  5.  Eccentric MR jet; appears mild-moderate on parasternal views, but  on  apical views (see images 52, 66, 72) extends into distal atrium and may  have Coanda effect. Incompletely visualized and PV not sampled, suspect  moderate to severe MR.. The mitral  valve is rheumatic. Moderate to severe mitral valve regurgitation.  6. Tricuspid valve regurgitation is severe.  7. The aortic valve is tricuspid. Aortic valve regurgitation is not  visualized. No aortic stenosis is present.  8. The inferior vena cava is normal in size with <50% respiratory  variability, suggesting right atrial pressure of 8 mmHg.   Echo 07/02/2018:  - Left ventricle: The cavity size was normal. Systolic function was  mildly reduced. The estimated ejection fraction was in the range  of 45% to 50%. There is moderate hypokinesis of the  entireanteroseptal myocardium.  - Aortic valve: There was mild regurgitation.  - Mitral valve: Mildly dilated, calcified annulus. There was severe  regurgitation.  - Left atrium: The atrium was severely dilated.  - Right ventricle: The cavity size was mildly dilated. Wall  thickness was normal. Systolic function was mildly to moderately  reduced.  - Right atrium: The atrium was severely dilated.  - Tricuspid valve: There was severe regurgitation.  - Pulmonary arteries: Systolic pressure was mildly increased. PA  peak pressure: 31 mm Hg (S).   Cath 12/31/2017:   Prox LAD lesion is 10% stenosed.  Dist LM to Ost LAD lesion is 10% stenosed.  Ost Cx lesion is 15% stenosed.  The left ventricular systolic function is normal.  LV end diastolic pressure is normal.   Patient Profile     65 y.o. female with a hx of chronic diastolic and systolic HF with mildly reduced LVEF (45-50%),severe MR,atrial fibrillation on eliquis, HTN, and DMIIwho presented with worsening SOB, weight gain and LE edema found to have elevated BNP and pulmonary edema on chest xray consistent  with acute on chronic diastolic HF exacerbation.  Assessment & Plan    1.  Acute on chronic combined systolic and diastolic CHF: -Patient presented with worsening shortness of breath and weight gain with LE edema found to have a BNP at 2237 and pulmonary edema on CXR -Echocardiogram from 06/2020 with LVEF of 45 to 50% with global hypokinesis, moderate RV enlargement, mild LAE, moderate RAE, severe MR and severe TR with NYHA class III symptoms -Difficult to determine fluid volume status therefore she underwent RHC 12/22/20 which showed elevated filling pressures, low cardiac index, low PAPi. Given this, she was started on low dose milrinone to aide in diuresis with low CO and RV dysfunction.  -Weight, 284lb within admission weight at 285llb  -I&O, net negative 3.4L>>>excellent diuresis overnight  -Creatinine elevated but stable at 2.00>>up from 1.94 yesterday  -Started on IV Milrinone with excellent diuresis as above  -Would continue with IV Lasix 80mg  BID for now and continue to follow renal function and volume status closely  2.  Persistent atrial fibrillation: -Has a history of failed Tikosyn load in the past with brief conversion to NSR however back to atrial fibrillation felt to likely be secondary to severe MR -Continue anticoagulation with Eliquis, amiodarone 200 mg twice daily, digoxin and Toprol-XL 100 mg twice daily -Likely candidate for Maze procedure with MVR however this will be determined by thoracic surgery  3.  Severe MR/severe TR: -TEE with confirmed severe MR awaiting CV surgery evaluation in the outpatient setting -Plan as above  4.  HLD: -Continue Crestor 10 mg daily  5.  DM2: -Hemoglobin A1c, 9.3 -SSI for glucose control -Management per primary  team  6.  CKD stage IV: -Baseline creatinine appears to be in the 1.6-1.7 range -Creatinine, 2.00 today which is up from 1.94 yesterday   Signed, Kathyrn Drown NP-C [place] Pager: [phone] 12/23/2020, 7:27  AM     For questions or updates, please contact   Please consult [email or web address] for contact info under Cardiology/STEMI.  Patient seen and examined and agree with Kathyrn Drown NP-C as detailed above.  In brief, the patient is a with a 65 year old female with history of chronic diastolic and systolic HF with mildly reduced LVEF (45-50%),severe MR,atrial fibrillation on eliquis, HTN, and DMIIwho presented with worsening SOB, weight gain and LE edema found to have elevated BNP and pulmonary edema on chest xray consistent with acute on chronic diastolic HF exacerbation.  Underwent RHC yesterday with results detailed below:  RA mean 16 with prominent v waves to 22 RV 44/10 PA 44/15, mean 29 PCWP mean 21 with prominent v waves to 35  Oxygen saturations: PA 58% AO 100%  Cardiac Output (Fick) 4.5  Cardiac Index (Fick) 1.91 PVR 1.8 WU PAPi 1.8  Given elevated filling pressures, low CI and low PAPi, the patient was started on milrinone for inotropic support. PICC line placed this AM. Co-ox pending. Diuresis significantly improved with addition of milrinone. Has slowed this AM, however.  GEN: No acute distress.   Neck: Prominent v-waves in JVD in setting of known TR Cardiac: Irregular, 2/6 systolic murmur Respiratory: Clear to auscultation bilaterally. GI: Soft, nontender, non-distended  MS: Warm, trace nonpitting edema Neuro:  Nonfocal  Psych: Normal affect   Plan: -Continue milrinone 0.125 for inotropic support -Continue lasix 80mg  IV BID; will give metolazone 2.5mg  PO once given slowing in diuresis this AM -S/p PICC line placement; trend CVP and co-ox -Will need close monitoring of lytes with diuresis with goal K>4, Mg>2 -Stopped metop due to low CI -Unable to tolerate GDMT at this time due to low output state and renal function; will add as able -Continue eliquis for [facility] -Continue amiodarone 200mg  BID and digoxin for Afib -Will need surgical evaluation for MVR given known  severe MR; process has been initiated as out-patient  Gwyndolyn Kaufman, MD

## 2020-12-23 NOTE — Progress Notes (Signed)
Pt suddenly complains localized Chest pain on the left, 8/10. Pt unable to described much but she stated, "haven't had this before". Tylenol given and EKG done. Family med  Nancy Fetter was paged, who came to see  Patient.  -will monitor

## 2020-12-23 NOTE — H&P (View-Only) (Signed)
[address]       ,[place] [address]             [phone]        [name] [place] Medical Record #[id number] Date of Birth: [birth year]  Referring: No ref. provider found Primary Care: [name], NP Primary Cardiologist:Bridgette Harrell Gave, MD  Chief Complaint:    Chief Complaint  Patient presents with  . Chest Pain  . 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 EKG was without ST changes and pain was not relieved with nitro paste. The patient is on diuretics at home and sleeps on 2-3 pillows at night. She can't sleep flat due to orthopnea.  Her weight was also up about 30 lbs from her baseline. She underwent a right heart catheterization yesterday which showed a low cardiac index, findings consistent with significant MR and TR. Significant RV dysfunction suggested.   His last echocardiogram showed an estimated LVEF of 45-50%. Severe MR and Severe TR. Most recent study was 11/26/2020. She has a past medical history positive for CA in both her mother and father. She lives here in [place] with her daughter and few of her grandchildren.    Current Activity/ Functional Status: Patient was independent with mobility/ambulation, transfers, ADL's, IADL's.   Zubrod Score: At the time of surgery this patient's most appropriate activity status/level should be described as: _0     0    Normal activity, no symptoms _1     1    Restricted in physical strenuous activity but ambulatory, able to do out light work _2     2    Ambulatory and capable of self care, unable to do work  activities, up and about                 more than 50%  Of the time                            _3     3    Only limited self care, in bed greater than 50% of waking hours _4     4    Completely disabled, no self care, confined to bed or chair _5     5    Moribund  Past Medical History:  Diagnosis Date  . Allergic rhinitis   . Arthritis   . Asthma   . Chest pain 12/27/2017  . Chronic diastolic CHF (congestive heart failure) ([place])   . COPD (chronic obstructive pulmonary disease) ([place])   . Depression   . DM (diabetes mellitus) ([place])   . DVT (deep vein thrombosis) in pregnancy   . Dysrhythmia    atrial fibrilation  . GERD (gastroesophageal reflux disease)   . Headache(784.0)   . HTN (hypertension)   . Hyperlipidemia   . Obesity   . Pneumonia 04/2018   RIGHT LOBE  . Shortness of breath   . Sleep apnea    compliant with CPAP    Past Surgical History:  Procedure Laterality Date  . ABDOMINAL HYSTERECTOMY    .  BUBBLE STUDY  11/26/2020   Procedure: BUBBLE STUDY;  Surgeon: Buford Dresser, MD;  Location: [place];  Service: Cardiovascular;;  . CARDIOVERSION N/A 05/06/2020   Procedure: CARDIOVERSION;  Surgeon: Buford Dresser, MD;  Location: [facility];  Service: Cardiovascular;  Laterality: N/A;  . CARDIOVERSION N/A 11/04/2020   Procedure: CARDIOVERSION;  Surgeon: Lelon Perla, MD;  Location: [facility] ENDOSCOPY;  Service: Cardiovascular;  Laterality: N/A;  . CATARACT EXTRACTION, BILATERAL    . CHOLECYSTECTOMY    . COLONOSCOPY WITH PROPOFOL N/A 03/05/2015   Procedure: COLONOSCOPY WITH PROPOFOL;  Surgeon: Carol Ada, MD;  Location: [facility] ENDOSCOPY;  Service: Endoscopy;  Laterality: N/A;  . DIAGNOSTIC LAPAROSCOPY    . ingrown hallux Left   . KNEE SURGERY    . LEFT HEART CATH AND CORONARY ANGIOGRAPHY N/A 12/31/2017   Procedure: LEFT HEART CATH AND CORONARY ANGIOGRAPHY;  Surgeon: Charolette Forward, MD;  Location: [place] CV LAB;  Service: Cardiovascular;  Laterality: N/A;  .  RIGHT HEART CATH N/A 12/22/2020   Procedure: RIGHT HEART CATH;  Surgeon: Larey Dresser, MD;  Location: [place] CV LAB;  Service: Cardiovascular;  Laterality: N/A;  . TEE WITHOUT CARDIOVERSION N/A 11/26/2020   Procedure: TRANSESOPHAGEAL ECHOCARDIOGRAM (TEE);  Surgeon: Buford Dresser, MD;  Location[facility] ENDOSCOPY;  Service: Cardiovascular;  Laterality: N/A;    Social History   Tobacco Use  Smoking Status Never Smoker  Smokeless Tobacco Never Used    Social History   Substance and Sexual Activity  Alcohol Use No     Allergies  Allergen Reactions  . Sulfa Antibiotics Itching    Current Facility-Administered Medications  Medication Dose Route Frequency Provider Last Rate Last Admin  . 0.9 %  sodium chloride infusion  250 mL Intravenous PRN Gifford Shave, MD      . acetaminophen (TYLENOL) tablet 650 mg  650 mg Oral Q4H PRN Gifford Shave, MD   650 mg at 12/23/20 0334  . albuterol (PROVENTIL) (2.5 MG/3ML) 0.083% nebulizer solution 2.5 mg  2.5 mg Nebulization Q4H PRN Gifford Shave, MD      . amiodarone (PACERONE) tablet 200 mg  200 mg Oral BID Gifford Shave, MD   200 mg at 12/23/20 0949  . apixaban (ELIQUIS) tablet 5 mg  5 mg Oral BID Gifford Shave, MD   5 mg at 12/23/20 0949  . Chlorhexidine Gluconate Cloth 2 % PADS 6 each  6 each Topical Daily Lind Covert, MD   6 each at 12/23/20 1207  . cycloSPORINE (RESTASIS) 0.05 % ophthalmic emulsion 1 drop  1 drop Both Eyes BID Gifford Shave, MD   1 drop at 12/23/20 0950  . digoxin (LANOXIN) tablet 0.125 mg  0.125 mg Oral Daily Gifford Shave, MD   0.125 mg at 12/23/20 0949  . DULoxetine (CYMBALTA) DR capsule 60 mg  60 mg Oral Daily Gifford Shave, MD   60 mg at 12/23/20 0949  . furosemide (LASIX) injection 80 mg  80 mg Intravenous BID Sharion Settler, DO   80 mg at 12/23/20 0945  . gabapentin (NEURONTIN) capsule 600 mg  600 mg Oral BID Gifford Shave, MD   600 mg at 12/23/20 0949  . insulin detemir  (LEVEMIR) injection 20 Units  20 Units Subcutaneous BID Gifford Shave, MD   20 Units at 12/23/20 [id number]  . menthol-cetylpyridinium (CEPACOL) lozenge 3 mg  1 lozenge Oral PRN Espinoza, Alejandra, DO      . milrinone (PRIMACOR) 20 MG/100 ML (0.2 mg/mL) infusion  0.125 mcg/kg/min Intravenous Continuous Freada Bergeron,  MD 4.87 mL/hr at 12/23/20 0331 0.125 mcg/kg/min at 12/23/20 0331  . polyethylene glycol (MIRALAX / GLYCOLAX) packet 17 g  17 g Oral Daily PRN Nita Sells, Alejandra, DO      . potassium chloride SA (KLOR-CON) CR tablet 40 mEq  40 mEq Oral BID Nita Sells, Alejandra, DO   40 mEq at 12/23/20 1103  . rosuvastatin (CRESTOR) tablet 10 mg  10 mg Oral Daily Gifford Shave, MD   10 mg at 12/23/20 1108  . sodium chloride flush (NS) 0.9 % injection 10-40 mL  10-40 mL Intracatheter Q12H Chambliss, Marshall L, MD      . sodium chloride flush (NS) 0.9 % injection 10-40 mL  10-40 mL Intracatheter PRN Chambliss, Marshall L, MD      . sodium chloride flush (NS) 0.9 % injection 3 mL  3 mL Intravenous Q12H Gifford Shave, MD   3 mL at 12/23/20 0952  . sodium chloride flush (NS) 0.9 % injection 3 mL  3 mL Intravenous PRN Gifford Shave, MD   3 mL at 12/21/20 1031  . sodium chloride flush (NS) 0.9 % injection 3 mL  3 mL Intravenous Q12H Kathyrn Drown D, NP   3 mL at 12/23/20 6269    Facility-Administered Medications Prior to Admission  Medication Dose Route Frequency Provider Last Rate Last Admin  . [DISCONTINUED] nitroGLYCERIN (NITROSTAT) SL tablet 0.4 mg  0.4 mg Sublingual Q5 min PRN Buford Dresser, MD   0.4 mg at 11/22/20 1414   Medications Prior to Admission  Medication Sig Dispense Refill Last Dose  . Accu-Chek Softclix Lancets lancets Use as instructed to check blood sugar three times daily. E11.69 100 each 12 Past Week at Unknown time  . albuterol (VENTOLIN HFA) 108 (90 Base) MCG/ACT inhaler Inhale 2 puffs into the lungs every 4 (four) hours as needed for wheezing or shortness of  breath.    12/18/2020 at Unknown time  . Alcohol Swabs (B-D SINGLE USE SWABS REGULAR) PADS    Past Week at Unknown time  . amiodarone (PACERONE) 200 MG tablet Take 1 tablet (200 mg total) by mouth 2 (two) times daily. 60 tablet 6 Past Week at Unknown time  . apixaban (ELIQUIS) 5 MG TABS tablet Take 1 tablet (5 mg total) by mouth 2 (two) times daily. 180 tablet 1 Past Week at 6p  . aspirin-sod bicarb-citric acid (ALKA-SELTZER) 325 MG TBEF tablet Take 325 mg by mouth every 6 (six) hours as needed (indigestion).   Past Week at Unknown time  . Blood Glucose Monitoring Suppl (ACCU-CHEK GUIDE ME) w/Device KIT Use as instructed to check blood sugar three times daily. E11.69 1 kit 0 12/19/2020 at Unknown time  . Blood Glucose Monitoring Suppl (ACCU-CHEK GUIDE) w/Device KIT USE AS INSTRUCTED TO CHECK BLOOD SUGAR THREE TIMES DAILY. E11.69   12/19/2020 at Unknown time  . digoxin (LANOXIN) 0.125 MG tablet Take 0.125 mg by mouth daily.    Past Week at Unknown time  . DULoxetine (CYMBALTA) 60 MG capsule Take 1 capsule (60 mg total) by mouth daily. 90 capsule 3 Past Week at Unknown time  . gabapentin (NEURONTIN) 300 MG capsule Take 2 capsules (600 mg total) by mouth 2 (two) times daily. 360 capsule 1 Past Week at Unknown time  . insulin aspart (NOVOLOG) 100 UNIT/ML injection Inject 12 Units into the skin 3 (three) times daily before meals. For blood sugars 0-199 give 0 units of insulin, 201-250 give 4 units, 251-300 give 6 units, 301-350 give 8 units, 351-400 give 10 units,>  400 give 12 units and call M.D. (Patient taking differently: Inject 5 Units into the skin 3 (three) times daily before meals. For blood sugars 0-199 give 0 units of insulin, 201-250 give 4 units, 251-300 give 6 units, 301-350 give 8 units, 351-400 give 7.) 10 mL 3 Past Week at Unknown time  . LEVEMIR 100 UNIT/ML injection INJECT 0.4 MLS (40 UNITS TOTAL) INTO THE SKIN 2 (TWO) TIMES DAILY. (Patient taking differently: Inject 50 Units into the skin 2 (two)  times daily.) 60 mL 1 Past Week at Unknown time  . Multiple Vitamins-Minerals (MULTIVITAMIN WOMEN PO) Take 1 tablet by mouth daily.    Past Week at Unknown time  . nitroGLYCERIN (NITROSTAT) 0.4 MG SL tablet Place 1 tablet (0.4 mg total) under the tongue every 5 (five) minutes x 3 doses as needed for chest pain. 25 tablet 12 12/19/2020 at Unknown time  . TRUE METRIX BLOOD GLUCOSE TEST test strip USE AS INSTRUCTED TO CHECK BLOOD SUGAR DAILY. 100 strip 11 Past Month at Unknown time  . TRULICITY 1.5 FW/2.6VZ SOPN INJECT 1.5 MG INTO THE SKIN ONCE A WEEK. 0.5 mL 1 Past Week at Unknown time  . EPINEPHrine 0.3 mg/0.3 mL IJ SOAJ injection Inject 0.3 mg into the muscle as needed for anaphylaxis. (Patient taking differently: Inject 0.3 mg into the muscle as needed for anaphylaxis. Never used) 1 each 1 unk  . metoprolol succinate (TOPROL-XL) 100 MG 24 hr tablet Take 100 mg by mouth in the morning and at bedtime. Take with or immediately following a meal.   12/16/2020 at 6p  . Potassium Chloride ER 20 MEQ TBCR Take 20 mEq by mouth daily. 30 tablet 6 12/16/2020  . RESTASIS 0.05 % ophthalmic emulsion Place 1 drop into both eyes 2 (two) times daily.  12 12/16/2020  . rosuvastatin (CRESTOR) 10 MG tablet Take 1 tablet (10 mg total) by mouth daily. 90 tablet 1 12/16/2020  . torsemide 40 MG TABS Take 40 mg by mouth 2 (two) times daily. 60 tablet 11 12/16/2020    Family History  Problem Relation Age of Onset  . Cancer Mother   . Hypertension Mother   . Cancer Father   . CAD Other   . Hypertension Sister   . Hypertension Brother      Review of Systems:   Review of Systems  Respiratory: Positive for shortness of breath.   Cardiovascular: Positive for chest pain and leg swelling.  Gastrointestinal: Positive for nausea and vomiting.   Pertinent items are noted in HPI.     Physical Exam: BP (!) 120/99 (BP Location: Left Wrist)   Pulse 72   Temp 97.7 F (36.5 C) (Oral)   Resp 18   Ht _0  (1.702 m)   Wt  129.1 kg   SpO2 100%   BMI 44.58 kg/m    General appearance: alert, cooperative and no distress Resp: clear to auscultation bilaterally Cardio: regular rate and rhythm, S1, S2 normal, no murmur, click, rub or gallop GI: soft, non-tender; bowel sounds normal; no masses,  no organomegaly Extremities: non pitting edema bilaterally Neurologic: Grossly normal  Diagnostic Studies & Laboratory data:     Recent Radiology Findings:   CARDIAC CATHETERIZATION  Result Date: 12/22/2020 1. Elevated R > L heart filling pressures with PAPi low but not markedly so (1.8) suggestive of a significant component of RV dysfunction. 2. Prominent v-waves in PCWP and RA pressure tracings suggestive of significant MR and TR. 3. Cardiac index low.   DG CHEST  PORT 1 VIEW  Result Date: 12/23/2020 CLINICAL DATA:  PICC placement EXAM: PORTABLE CHEST 1 VIEW COMPARISON:  03/02/2020 FINDINGS: Cardiomegaly. Artifact from EKG leads. Right-sided PICC with tip at the SVC. Interstitial prominence without Kerley lines, effusion, or pneumothorax. IMPRESSION: New PICC with tip at the SVC. Electronically Signed   By: Monte Fantasia M.D.   On: 12/23/2020 09:08   [place] EKG SITE RITE  Result Date: 12/22/2020 If Site Rite image not attached, placement could not be confirmed due to current cardiac rhythm.    I have independently reviewed the above radiologic studies and discussed with the patient   Recent Lab Findings: Lab Results  Component Value Date   WBC 7.1 12/19/2020   HGB 12.9 12/22/2020   HGB 13.3 12/22/2020   HCT 38.0 12/22/2020   HCT 39.0 12/22/2020   PLT 187 12/19/2020   GLUCOSE 136 (H) 12/23/2020   CHOL 140 06/24/2020   TRIG 102 06/24/2020   HDL 46 06/24/2020   LDLCALC 75 06/24/2020   ALT 12 12/19/2020   AST 19 12/19/2020   NA 136 12/23/2020   K 3.3 (L) 12/23/2020   CL 100 12/23/2020   CREATININE 2.00 (H) 12/23/2020   BUN 32 (H) 12/23/2020   CO2 30 12/23/2020   TSH 2.820 12/23/2019   INR 1.5 (H)  03/02/2020   HGBA1C 9.3 (H) 11/02/2020      Assessment / Plan:      1. Severe MR and TR-plan for repair/replacement next week 2. Acute on chronic combined systolic and diastolic CHF-HF following receiving Lasix 95m BID IV. On milrinone 0.125. Continue medical optimization.  3. Hypertension-holding agents for now while diuresing. BP has been well controlled  4. Persistent atrial fibrillation- currently in NSR. She is on Amio & Eliquis 5. HLD-continue statin therapy 6. DM2-uncontrolled with associated neuropathy (on gabapentin), recent A1C as 9.3 7. CKD stage IV-creatinine 2.00 8. COPD-continue nebs, not on home oxygen 9. GERD 10. OSA  Plan: On Eliquis currently, so will allow for washout. MV repair/replacement and TV repair/replacement discussed with the patient in detail and all questions were answered to the patient's satisfaction. No family currently at the bedside but a few members may stop by after work today. She understands the procedure and plans to proceed. Dr. HRoxan Hockeyto discuss in further detail when he sees the patient later today.     I  spent 55 minutes counseling the patient face to face.   TNicholes Rough PA-C 12/23/2020 1:18 PM  Patient seen and examined, chart, lab results and echo images reviewed. 65yo woman with a history of obesity, poorly controlled type 2 diabetes, hyperlipidemia, stage IV CKD, sleep apnea, hypertension, persistent atrial fibrillation, severe MR and severe TR presented with nausea and shortness of breath. Noted to be in decompensated acute on chronic systolic and diastolic heart failure.  Admitted for medical management. TEE in April showed EF 45-50%, severe MR with restricted posterior leaflet, severe TR and bilateral atrial enlargement. Right heart cath this admission showed PA O2 sat 58%, CI = 1.9, PA 44/15 and PCWP 21 with V waves to 35.  She needs valvular pathology addressed. Also recommend Maze for persistent atrial fibrillation at time of  valve surgery. Will attempt to repair mitral although it may need to be replaced. Tricuspid should be treatable with annuloplasty.  I discussed the general nature of the procedure (MV repair/ replacement, TV repair, Maze) with [name].  We discussed the need for general anesthesia, the incisions to be used, the  use of cardiopulmonary bypass, and the use of drainage tube postoperatively. We discussed the expected hospital stay, overall recovery and short and long term outcomes. I informed her of the indications, risks, benefits and alternatives.  She understand the risks include, but are not limited to death, stroke, MI, DVT/PE, bleeding, possible need for transfusion, infections, cardiac arrhythmias, heart block requiring pacemaker, as well as other organ system dysfunction including respiratory, renal, or GI complications.   Needs dental eval preop.  Continue medical therapy to optimize status prior to surgery  Possible OR next week  Remo Lipps C. Roxan Hockey, MD [facility] [phone]

## 2020-12-23 NOTE — Progress Notes (Signed)
Peripherally Inserted Central Catheter Placement  The IV Nurse has discussed with the patient and/or persons authorized to consent for the patient, the purpose of this procedure and the potential benefits and risks involved with this procedure.  The benefits include less needle sticks, lab draws from the catheter, and the patient may be discharged home with the catheter. Risks include, but not limited to, infection, bleeding, blood clot (thrombus formation), and puncture of an artery; nerve damage and irregular heartbeat and possibility to perform a PICC exchange if needed/ordered by physician.  Alternatives to this procedure were also discussed.  Bard Power PICC patient education guide, fact sheet on infection prevention and patient information card has been provided to patient /or left at bedside.    PICC Placement Documentation  PICC Double Lumen 99/87/21 Right Cephalic 40 cm 0 cm (Active)  Indication for Insertion or Continuance of Line Vasoactive infusions 12/23/20 0842  Exposed Catheter (cm) 0 cm 12/23/20 0842  Site Assessment Clean;Dry;Intact 12/23/20 0842  Lumen #1 Status Flushed;Saline locked;Blood return noted 12/23/20 0842  Lumen #2 Status Flushed;Saline locked;Blood return noted 12/23/20 0842  Dressing Type Transparent;Securing device 12/23/20 5872  Dressing Status Clean;Dry;Intact 12/23/20 0842  Antimicrobial disc in place? Yes 12/23/20 0842  Dressing Intervention New dressing 12/23/20 7618  Dressing Change Due 12/30/20 12/23/20 [place], [place] 12/23/2020, 8:45 AM

## 2020-12-23 NOTE — Progress Notes (Signed)
Occupational Therapy Treatment Patient Details Name: [name] MRN: [id number] DOB: 05/05/1956 Today's Date: 12/23/2020    History of present illness Patient is a 65 y/o female who presents on 12/19/20 with SOB, chest pain, N/V and emesis. Admitted with CHF exacerbation. PMH includes HTN. DM2, A-fib, CHF, OSA, DVT, COPD.   OT comments  Pt making good progress with functional goals. Session focused on functional mobility to walk to bathroom, toilet transfers, toileting tasks, grooming/hygiene standing at sink, energy conservation education and ADL mobility safety. OT will continue to follow acutely to maximize level of function and safety  Follow Up Recommendations  No OT follow up;Supervision - Intermittent    Equipment Recommendations  Tub/shower bench;Other (comment) (reacher, LH bath sponge)    Recommendations for Other Services      Precautions / Restrictions Precautions Precautions: Other (comment);Fall Precaution Comments: watch 02, hx of falls Restrictions Weight Bearing Restrictions: No       Mobility Bed Mobility Overal bed mobility: Needs Assistance Bed Mobility: Supine to Sit     Supine to sit: Supervision;HOB elevated          Transfers Overall transfer level: Needs assistance Equipment used: None;Rolling walker (2 wheeled) Transfers: Sit to/from Stand Sit to Stand: Min guard         General transfer comment: Min guard for safety.  Initially unsteady.    Balance Overall balance assessment: History of Falls;Needs assistance Sitting-balance support: Feet supported;No upper extremity supported Sitting balance-Leahy Scale: Good     Standing balance support: During functional activity Standing balance-Leahy Scale: Fair Standing balance comment: Able to stand at sink for grooming/hygiene tasks                           ADL either performed or assessed with clinical judgement   ADL Overall ADL's : Needs assistance/impaired      Grooming: Wash/dry hands;Wash/dry face;Oral care;Standing;Supervision/safety       Lower Body Bathing: Min guard;Sitting/lateral leans;Sit to/from stand Lower Body Bathing Details (indicate cue type and reason): simulated     Lower Body Dressing: Min guard;Sitting/lateral leans;Sit to/from stand   Toilet Transfer: Ambulation;Comfort height toilet;Grab bars;Min guard   Toileting- Clothing Manipulation and Hygiene: Supervision/safety;Sit to/from stand       Functional mobility during ADLs: Min guard General ADL Comments: reviewed energy conservation techniques for ADLs and ADL mobility     Vision Baseline Vision/History: Wears glasses Patient Visual Report: No change from baseline     Perception     Praxis      Cognition Arousal/Alertness: Awake/alert Behavior During Therapy: WFL for tasks assessed/performed Overall Cognitive Status: Within Functional Limits for tasks assessed                                          Exercises     Shoulder Instructions       General Comments      Pertinent Vitals/ Pain       Pain Assessment: No/denies pain Pain Score: 0-No pain Pain Intervention(s): Monitored during session;Repositioned  Home Living                                          Prior Functioning/Environment  Frequency  Min 2X/week        Progress Toward Goals  OT Goals(current goals can now be found in the care plan section)  Progress towards OT goals: Progressing toward goals  Acute Rehab OT Goals Patient Stated Goal: get fluid off and go home  Plan Discharge plan remains appropriate    Co-evaluation                 AM-PAC OT "6 Clicks" Daily Activity     Outcome Measure   Help from another person eating meals?: None Help from another person taking care of personal grooming?: A Little Help from another person toileting, which includes using toliet, bedpan, or urinal?: A Little Help  from another person bathing (including washing, rinsing, drying)?: A Little Help from another person to put on and taking off regular upper body clothing?: None Help from another person to put on and taking off regular lower body clothing?: A Little 6 Click Score: 20    End of Session Equipment Utilized During Treatment: Rolling walker  OT Visit Diagnosis: Unsteadiness on feet (R26.81);History of falling (Z91.81);Pain   Activity Tolerance Patient tolerated treatment well   Patient Left in chair;with call bell/phone within reach   Nurse Communication          Time: 2010-0712 OT Time Calculation (min): 26 min  Charges: OT General Charges $OT Visit: 1 Visit OT Treatments $Self Care/Home Management : 8-22 mins $Therapeutic Activity: 8-22 mins     Britt Bottom 12/23/2020, 1:00 PM

## 2020-12-23 NOTE — Progress Notes (Signed)
FPTS Interim Progress Note  S: Went to bedside to evaluate patient after being paged about chest pain.  Patient reports she started having sharp chest pain that woke her up from sleep, located horizontally across the left side of her chest. Started about 30 minutes ago, rated 8/10. She was given Tylenol, pain remains about the same. Also reporting pain in her bilateral legs. Denies SOB. EKG obtained prior to evaluation.  O: BP 122/75 (BP Location: Left Wrist)   Pulse 76   Temp 97.6 F (36.4 C)   Resp 18   Ht 5\' 7"  (1.702 m)   Wt 129.1 kg   SpO2 98%   BMI 44.58 kg/m   Gen: Obese older female resting in bed comfortably, NAD CV: irregularly irregular, normal rate Pulm: breathing comfortably on room air  A/P: EKG revealing A Fib not in RVR without ST changes, overall unremarkable. Negative troponins on admission (8, 12). Patient had a RHC during the day notable for mild elevated right heart filling pressures and findings suggestive of significant MR and TR. Also had an unsuccessful attempted PICC placement earlier in the evening, possibly related to her pain. Will recycle troponins. - troponin x2 - page cardiology if troponins rising  Zola Button, MD 12/23/2020, 4:31 AM PGY-1, [place] Medicine Service pager [phone]

## 2020-12-23 NOTE — Care Management (Signed)
12-23-20 Order has been placed into Epic for a Shower stool. Case Manager made the referral with [employer]. [employer] to deliver the durable medical equipment to the room. No further needs at this time. Bethena Roys, RN,BSN Case Manager

## 2020-12-23 NOTE — Progress Notes (Signed)
Heart Failure Stewardship Pharmacist Progress Note   PCP: [name], NP PCP-Cardiologist: Buford Dresser, MD    HPI:  65 yo female with a hx of chronic diastolic and systolic HF, difficult-to-control atrial fibrillation on Eliquis, HTN, CAD with nonobstructive CAD on LHC in 2019, and T2DM. She presented to the ED on 5/1 with SOB and L-sided CP found to have acute CHF exacerbation. ECHO last done on 11/26/20 revealed LVEF 45-50%, RV function mildly reduced, severe MR and TR. RHC on 5/4 revealed low CI (1.9) and elevated filling pressures (R>L) with low PAPi (1.8). Started on IV milrinone. PICC placed on 5/4 and started on milrinone 0.125 mcg/kg/min.  Current HF Medications: Furosemide IV 80 mg BID Milrinone 0.125 mcg/kg/min Digoxin 0.125 mg daily  Prior to admission HF Medications: Torsemide 40 mg BID Metoprolol succinate 100 mg BID Digoxin 0.125 mg daily  Pertinent Lab Values: . Serum creatinine 2.00, BUN 32, Potassium 3.3, Sodium 136,  . 5/4: Digoxin 0.8   Vital Signs: . Weight: 284 lbs (admission weight: 285 lbs) . Blood pressure: 120/80s  . Heart rate: 70s   Medication Assistance / Insurance Benefits Check: Does the patient have prescription insurance?  Yes Type of insurance plan: [employer] Medicare and [place] Medicaid  Outpatient Pharmacy:  Prior to admission [facility] Is the patient willing to use [facility] at discharge? Yes Is the patient willing to transition their outpatient pharmacy to utilize a [facility] outpatient pharmacy?   Pending    Assessment: 1. Acute on chronic systolic CHF (EF 12-87%), due to NICM. NYHA class II-III symptoms. - Low cardiac index on RHC (1.9) - on IV milrinone 0.125 mcg/kg/min. Coox not checked yet.  - Continues to have labored breathing with ambulation, elevated RA 16 on RHC - continue furosemide IV 80 mg BID.  - Off metoprolol given low cardiac index  - Continue digoxin 0.125 mg daily - last level checked was  WNL on 5/4 - Can consider addition of Entresto, spironolactone, and SGLT2i once off IV milrinone and renal function improves   Plan: 1) Medication changes recommended at this time: - Check coox  2) Patient assistance: - None needed for now  3)  Education  - To be completed prior to discharge  Kerby Nora, PharmD, BCPS Heart Failure Stewardship Pharmacist Phone [phone]

## 2020-12-23 NOTE — Consult Note (Addendum)
[address]       ,[place] [address]             [phone]        [name] [place] Medical Record #[id number] Date of Birth: [birth year]  Referring: No ref. provider found Primary Care: [name], NP Primary Cardiologist:Bridgette Harrell Gave, MD  Chief Complaint:    Chief Complaint  Patient presents with  . Chest Pain  . Nausea    History of Present Illness:      [name] is a 65 year old female patient with a past medical history significant for acute on chronic combinded systolic and diastolic heart failure, DM type 2, persistent Atrial fibrillation, Obesity, OSA, Severe MR & TR, GERD, COPD, Hyperlipidemia, and CKD stage IV who presented to the ED with left-sided chest pain and dyspnea with ambulation and at rest. EKG was without ST changes and pain was not relieved with nitro paste. The patient is on diuretics at home and sleeps on 2-3 pillows at night. She can't sleep flat due to orthopnea.  Her weight was also up about 30 lbs from her baseline. She underwent a right heart catheterization yesterday which showed a low cardiac index, findings consistent with significant MR and TR. Significant RV dysfunction suggested.   His last echocardiogram showed an estimated LVEF of 45-50%. Severe MR and Severe TR. Most recent study was 11/26/2020. She has a past medical history positive for CA in both her mother and father. She lives here in [place] with her daughter and few of her grandchildren.    Current Activity/ Functional Status: Patient was independent with mobility/ambulation, transfers, ADL's, IADL's.   Zubrod Score: At the time of surgery this patient's most appropriate activity status/level should be described as: _0     0    Normal activity, no symptoms _1     1    Restricted in physical strenuous activity but ambulatory, able to do out light work _2     2    Ambulatory and capable of self care, unable to do work  activities, up and about                 more than 50%  Of the time                            _3     3    Only limited self care, in bed greater than 50% of waking hours _4     4    Completely disabled, no self care, confined to bed or chair _5     5    Moribund  Past Medical History:  Diagnosis Date  . Allergic rhinitis   . Arthritis   . Asthma   . Chest pain 12/27/2017  . Chronic diastolic CHF (congestive heart failure) ([place])   . COPD (chronic obstructive pulmonary disease) ([place])   . Depression   . DM (diabetes mellitus) ([place])   . DVT (deep vein thrombosis) in pregnancy   . Dysrhythmia    atrial fibrilation  . GERD (gastroesophageal reflux disease)   . Headache(784.0)   . HTN (hypertension)   . Hyperlipidemia   . Obesity   . Pneumonia 04/2018   RIGHT LOBE  . Shortness of breath   . Sleep apnea    compliant with CPAP    Past Surgical History:  Procedure Laterality Date  . ABDOMINAL HYSTERECTOMY    .  BUBBLE STUDY  11/26/2020   Procedure: BUBBLE STUDY;  Surgeon: Buford Dresser, MD;  Location: [place];  Service: Cardiovascular;;  . CARDIOVERSION N/A 05/06/2020   Procedure: CARDIOVERSION;  Surgeon: Buford Dresser, MD;  [facility];  Service: Cardiovascular;  Laterality: N/A;  . CARDIOVERSION N/A 11/04/2020   Procedure: CARDIOVERSION;  Surgeon: Lelon Perla, MD;  Location: [facility] ENDOSCOPY;  Service: Cardiovascular;  Laterality: N/A;  . CATARACT EXTRACTION, BILATERAL    . CHOLECYSTECTOMY    . COLONOSCOPY WITH PROPOFOL N/A 03/05/2015   Procedure: COLONOSCOPY WITH PROPOFOL;  Surgeon: Carol Ada, MD;  Location: [facility] ENDOSCOPY;  Service: Endoscopy;  Laterality: N/A;  . DIAGNOSTIC LAPAROSCOPY    . ingrown hallux Left   . KNEE SURGERY    . LEFT HEART CATH AND CORONARY ANGIOGRAPHY N/A 12/31/2017   Procedure: LEFT HEART CATH AND CORONARY ANGIOGRAPHY;  Surgeon: Charolette Forward, MD;  Location: [place] CV LAB;  Service: Cardiovascular;  Laterality: N/A;  .  RIGHT HEART CATH N/A 12/22/2020   Procedure: RIGHT HEART CATH;  Surgeon: Larey Dresser, MD;  Location: [place] CV LAB;  Service: Cardiovascular;  Laterality: N/A;  . TEE WITHOUT CARDIOVERSION N/A 11/26/2020   Procedure: TRANSESOPHAGEAL ECHOCARDIOGRAM (TEE);  Surgeon: Buford Dresser, MD;  Location[facility] ENDOSCOPY;  Service: Cardiovascular;  Laterality: N/A;    Social History   Tobacco Use  Smoking Status Never Smoker  Smokeless Tobacco Never Used    Social History   Substance and Sexual Activity  Alcohol Use No     Allergies  Allergen Reactions  . Sulfa Antibiotics Itching    Current Facility-Administered Medications  Medication Dose Route Frequency Provider Last Rate Last Admin  . 0.9 %  sodium chloride infusion  250 mL Intravenous PRN Gifford Shave, MD      . acetaminophen (TYLENOL) tablet 650 mg  650 mg Oral Q4H PRN Gifford Shave, MD   650 mg at 12/23/20 0334  . albuterol (PROVENTIL) (2.5 MG/3ML) 0.083% nebulizer solution 2.5 mg  2.5 mg Nebulization Q4H PRN Gifford Shave, MD      . amiodarone (PACERONE) tablet 200 mg  200 mg Oral BID Gifford Shave, MD   200 mg at 12/23/20 0949  . apixaban (ELIQUIS) tablet 5 mg  5 mg Oral BID Gifford Shave, MD   5 mg at 12/23/20 0949  . Chlorhexidine Gluconate Cloth 2 % PADS 6 each  6 each Topical Daily Lind Covert, MD   6 each at 12/23/20 1207  . cycloSPORINE (RESTASIS) 0.05 % ophthalmic emulsion 1 drop  1 drop Both Eyes BID Gifford Shave, MD   1 drop at 12/23/20 0950  . digoxin (LANOXIN) tablet 0.125 mg  0.125 mg Oral Daily Gifford Shave, MD   0.125 mg at 12/23/20 0949  . DULoxetine (CYMBALTA) DR capsule 60 mg  60 mg Oral Daily Gifford Shave, MD   60 mg at 12/23/20 0949  . furosemide (LASIX) injection 80 mg  80 mg Intravenous BID Sharion Settler, DO   80 mg at 12/23/20 0945  . gabapentin (NEURONTIN) capsule 600 mg  600 mg Oral BID Gifford Shave, MD   600 mg at 12/23/20 0949  . insulin detemir  (LEVEMIR) injection 20 Units  20 Units Subcutaneous BID Gifford Shave, MD   20 Units at 12/23/20 [id number]  . menthol-cetylpyridinium (CEPACOL) lozenge 3 mg  1 lozenge Oral PRN Espinoza, Alejandra, DO      . milrinone (PRIMACOR) 20 MG/100 ML (0.2 mg/mL) infusion  0.125 mcg/kg/min Intravenous Continuous Freada Bergeron,  MD 4.87 mL/hr at 12/23/20 0331 0.125 mcg/kg/min at 12/23/20 0331  . polyethylene glycol (MIRALAX / GLYCOLAX) packet 17 g  17 g Oral Daily PRN Espinoza, Alejandra, DO      . potassium chloride SA (KLOR-CON) CR tablet 40 mEq  40 mEq Oral BID Espinoza, Alejandra, DO   40 mEq at 12/23/20 1103  . rosuvastatin (CRESTOR) tablet 10 mg  10 mg Oral Daily Cresenzo, Victor, MD   10 mg at 12/23/20 1108  . sodium chloride flush (NS) 0.9 % injection 10-40 mL  10-40 mL Intracatheter Q12H Chambliss, Marshall L, MD      . sodium chloride flush (NS) 0.9 % injection 10-40 mL  10-40 mL Intracatheter PRN Chambliss, Marshall L, MD      . sodium chloride flush (NS) 0.9 % injection 3 mL  3 mL Intravenous Q12H Cresenzo, Victor, MD   3 mL at 12/23/20 0952  . sodium chloride flush (NS) 0.9 % injection 3 mL  3 mL Intravenous PRN Cresenzo, Victor, MD   3 mL at 12/21/20 1031  . sodium chloride flush (NS) 0.9 % injection 3 mL  3 mL Intravenous Q12H McDaniel, Jill D, NP   3 mL at 12/23/20 0953    Facility-Administered Medications Prior to Admission  Medication Dose Route Frequency Provider Last Rate Last Admin  . [DISCONTINUED] nitroGLYCERIN (NITROSTAT) SL tablet 0.4 mg  0.4 mg Sublingual Q5 min PRN Christopher, Bridgette, MD   0.4 mg at 11/22/20 1414   Medications Prior to Admission  Medication Sig Dispense Refill Last Dose  . Accu-Chek Softclix Lancets lancets Use as instructed to check blood sugar three times daily. E11.69 100 each 12 Past Week at Unknown time  . albuterol (VENTOLIN HFA) 108 (90 Base) MCG/ACT inhaler Inhale 2 puffs into the lungs every 4 (four) hours as needed for wheezing or shortness of  breath.    12/18/2020 at Unknown time  . Alcohol Swabs (B-D SINGLE USE SWABS REGULAR) PADS    Past Week at Unknown time  . amiodarone (PACERONE) 200 MG tablet Take 1 tablet (200 mg total) by mouth 2 (two) times daily. 60 tablet 6 Past Week at Unknown time  . apixaban (ELIQUIS) 5 MG TABS tablet Take 1 tablet (5 mg total) by mouth 2 (two) times daily. 180 tablet 1 Past Week at 6p  . aspirin-sod bicarb-citric acid (ALKA-SELTZER) 325 MG TBEF tablet Take 325 mg by mouth every 6 (six) hours as needed (indigestion).   Past Week at Unknown time  . Blood Glucose Monitoring Suppl (ACCU-CHEK GUIDE ME) w/Device KIT Use as instructed to check blood sugar three times daily. E11.69 1 kit 0 12/19/2020 at Unknown time  . Blood Glucose Monitoring Suppl (ACCU-CHEK GUIDE) w/Device KIT USE AS INSTRUCTED TO CHECK BLOOD SUGAR THREE TIMES DAILY. E11.69   12/19/2020 at Unknown time  . digoxin (LANOXIN) 0.125 MG tablet Take 0.125 mg by mouth daily.    Past Week at Unknown time  . DULoxetine (CYMBALTA) 60 MG capsule Take 1 capsule (60 mg total) by mouth daily. 90 capsule 3 Past Week at Unknown time  . gabapentin (NEURONTIN) 300 MG capsule Take 2 capsules (600 mg total) by mouth 2 (two) times daily. 360 capsule 1 Past Week at Unknown time  . insulin aspart (NOVOLOG) 100 UNIT/ML injection Inject 12 Units into the skin 3 (three) times daily before meals. For blood sugars 0-199 give 0 units of insulin, 201-250 give 4 units, 251-300 give 6 units, 301-350 give 8 units, 351-400 give 10 units,>   400 give 12 units and call M.D. (Patient taking differently: Inject 5 Units into the skin 3 (three) times daily before meals. For blood sugars 0-199 give 0 units of insulin, 201-250 give 4 units, 251-300 give 6 units, 301-350 give 8 units, 351-400 give 7.) 10 mL 3 Past Week at Unknown time  . LEVEMIR 100 UNIT/ML injection INJECT 0.4 MLS (40 UNITS TOTAL) INTO THE SKIN 2 (TWO) TIMES DAILY. (Patient taking differently: Inject 50 Units into the skin 2 (two)  times daily.) 60 mL 1 Past Week at Unknown time  . Multiple Vitamins-Minerals (MULTIVITAMIN WOMEN PO) Take 1 tablet by mouth daily.    Past Week at Unknown time  . nitroGLYCERIN (NITROSTAT) 0.4 MG SL tablet Place 1 tablet (0.4 mg total) under the tongue every 5 (five) minutes x 3 doses as needed for chest pain. 25 tablet 12 12/19/2020 at Unknown time  . TRUE METRIX BLOOD GLUCOSE TEST test strip USE AS INSTRUCTED TO CHECK BLOOD SUGAR DAILY. 100 strip 11 Past Month at Unknown time  . TRULICITY 1.5 FW/2.6VZ SOPN INJECT 1.5 MG INTO THE SKIN ONCE A WEEK. 0.5 mL 1 Past Week at Unknown time  . EPINEPHrine 0.3 mg/0.3 mL IJ SOAJ injection Inject 0.3 mg into the muscle as needed for anaphylaxis. (Patient taking differently: Inject 0.3 mg into the muscle as needed for anaphylaxis. Never used) 1 each 1 unk  . metoprolol succinate (TOPROL-XL) 100 MG 24 hr tablet Take 100 mg by mouth in the morning and at bedtime. Take with or immediately following a meal.   12/16/2020 at 6p  . Potassium Chloride ER 20 MEQ TBCR Take 20 mEq by mouth daily. 30 tablet 6 12/16/2020  . RESTASIS 0.05 % ophthalmic emulsion Place 1 drop into both eyes 2 (two) times daily.  12 12/16/2020  . rosuvastatin (CRESTOR) 10 MG tablet Take 1 tablet (10 mg total) by mouth daily. 90 tablet 1 12/16/2020  . torsemide 40 MG TABS Take 40 mg by mouth 2 (two) times daily. 60 tablet 11 12/16/2020    Family History  Problem Relation Age of Onset  . Cancer Mother   . Hypertension Mother   . Cancer Father   . CAD Other   . Hypertension Sister   . Hypertension Brother      Review of Systems:   Review of Systems  Respiratory: Positive for shortness of breath.   Cardiovascular: Positive for chest pain and leg swelling.  Gastrointestinal: Positive for nausea and vomiting.   Pertinent items are noted in HPI.     Physical Exam: BP (!) 120/99 (BP Location: Left Wrist)   Pulse 72   Temp 97.7 F (36.5 C) (Oral)   Resp 18   Ht _0  (1.702 m)   Wt  129.1 kg   SpO2 100%   BMI 44.58 kg/m    General appearance: alert, cooperative and no distress Resp: clear to auscultation bilaterally Cardio: regular rate and rhythm, S1, S2 normal, no murmur, click, rub or gallop GI: soft, non-tender; bowel sounds normal; no masses,  no organomegaly Extremities: non pitting edema bilaterally Neurologic: Grossly normal  Diagnostic Studies & Laboratory data:     Recent Radiology Findings:   CARDIAC CATHETERIZATION  Result Date: 12/22/2020 1. Elevated R > L heart filling pressures with PAPi low but not markedly so (1.8) suggestive of a significant component of RV dysfunction. 2. Prominent v-waves in PCWP and RA pressure tracings suggestive of significant MR and TR. 3. Cardiac index low.   DG CHEST  PORT 1 VIEW  Result Date: 12/23/2020 CLINICAL DATA:  PICC placement EXAM: PORTABLE CHEST 1 VIEW COMPARISON:  03/02/2020 FINDINGS: Cardiomegaly. Artifact from EKG leads. Right-sided PICC with tip at the SVC. Interstitial prominence without Kerley lines, effusion, or pneumothorax. IMPRESSION: New PICC with tip at the SVC. Electronically Signed   By: Monte Fantasia M.D.   On: 12/23/2020 09:08   [place] EKG SITE RITE  Result Date: 12/22/2020 If Site Rite image not attached, placement could not be confirmed due to current cardiac rhythm.    I have independently reviewed the above radiologic studies and discussed with the patient   Recent Lab Findings: Lab Results  Component Value Date   WBC 7.1 12/19/2020   HGB 12.9 12/22/2020   HGB 13.3 12/22/2020   HCT 38.0 12/22/2020   HCT 39.0 12/22/2020   PLT 187 12/19/2020   GLUCOSE 136 (H) 12/23/2020   CHOL 140 06/24/2020   TRIG 102 06/24/2020   HDL 46 06/24/2020   LDLCALC 75 06/24/2020   ALT 12 12/19/2020   AST 19 12/19/2020   NA 136 12/23/2020   K 3.3 (L) 12/23/2020   CL 100 12/23/2020   CREATININE 2.00 (H) 12/23/2020   BUN 32 (H) 12/23/2020   CO2 30 12/23/2020   TSH 2.820 12/23/2019   INR 1.5 (H)  03/02/2020   HGBA1C 9.3 (H) 11/02/2020      Assessment / Plan:      1. Severe MR and TR-plan for repair/replacement next week 2. Acute on chronic combined systolic and diastolic CHF-HF following receiving Lasix 95m BID IV. On milrinone 0.125. Continue medical optimization.  3. Hypertension-holding agents for now while diuresing. BP has been well controlled  4. Persistent atrial fibrillation- currently in NSR. She is on Amio & Eliquis 5. HLD-continue statin therapy 6. DM2-uncontrolled with associated neuropathy (on gabapentin), recent A1C as 9.3 7. CKD stage IV-creatinine 2.00 8. COPD-continue nebs, not on home oxygen 9. GERD 10. OSA  Plan: On Eliquis currently, so will allow for washout. MV repair/replacement and TV repair/replacement discussed with the patient in detail and all questions were answered to the patient's satisfaction. No family currently at the bedside but a few members may stop by after work today. She understands the procedure and plans to proceed. Dr. HRoxan Hockeyto discuss in further detail when he sees the patient later today.     I  spent 55 minutes counseling the patient face to face.   TNicholes Rough PA-C 12/23/2020 1:18 PM  Patient seen and examined, chart, lab results and echo images reviewed. 65yo woman with a history of obesity, poorly controlled type 2 diabetes, hyperlipidemia, stage IV CKD, sleep apnea, hypertension, persistent atrial fibrillation, severe MR and severe TR presented with nausea and shortness of breath. Noted to be in decompensated acute on chronic systolic and diastolic heart failure.  Admitted for medical management. TEE in April showed EF 45-50%, severe MR with restricted posterior leaflet, severe TR and bilateral atrial enlargement. Right heart cath this admission showed PA O2 sat 58%, CI = 1.9, PA 44/15 and PCWP 21 with V waves to 35.  She needs valvular pathology addressed. Also recommend Maze for persistent atrial fibrillation at time of  valve surgery. Will attempt to repair mitral although it may need to be replaced. Tricuspid should be treatable with annuloplasty.  I discussed the general nature of the procedure (MV repair/ replacement, TV repair, Maze) with Ms. Maynard-Malter.  We discussed the need for general anesthesia, the incisions to be used, the  use of cardiopulmonary bypass, and the use of drainage tube postoperatively. We discussed the expected hospital stay, overall recovery and short and long term outcomes. I informed her of the indications, risks, benefits and alternatives.  She understand the risks include, but are not limited to death, stroke, MI, DVT/PE, bleeding, possible need for transfusion, infections, cardiac arrhythmias, heart block requiring pacemaker, as well as other organ system dysfunction including respiratory, renal, or GI complications.   Needs dental eval preop.  Continue medical therapy to optimize status prior to surgery  Possible OR next week  Remo Lipps C. Roxan Hockey, MD [facility] [phone]

## 2020-12-24 ENCOUNTER — Inpatient Hospital Stay (HOSPITAL_COMMUNITY): Payer: Medicare Other

## 2020-12-24 DIAGNOSIS — I5043 Acute on chronic combined systolic (congestive) and diastolic (congestive) heart failure: Secondary | ICD-10-CM | POA: Diagnosis not present

## 2020-12-24 DIAGNOSIS — I4821 Permanent atrial fibrillation: Secondary | ICD-10-CM | POA: Diagnosis not present

## 2020-12-24 DIAGNOSIS — Z01810 Encounter for preprocedural cardiovascular examination: Secondary | ICD-10-CM | POA: Diagnosis not present

## 2020-12-24 DIAGNOSIS — I340 Nonrheumatic mitral (valve) insufficiency: Secondary | ICD-10-CM | POA: Diagnosis not present

## 2020-12-24 DIAGNOSIS — I509 Heart failure, unspecified: Secondary | ICD-10-CM | POA: Diagnosis not present

## 2020-12-24 LAB — BASIC METABOLIC PANEL
Anion gap: 8 (ref 5–15)
BUN: 27 mg/dL — ABNORMAL HIGH (ref 8–23)
CO2: 31 mmol/L (ref 22–32)
Calcium: 9.1 mg/dL (ref 8.9–10.3)
Chloride: 98 mmol/L (ref 98–111)
Creatinine, Ser: 1.88 mg/dL — ABNORMAL HIGH (ref 0.44–1.00)
GFR, Estimated: 29 mL/min — ABNORMAL LOW (ref >60)
Glucose, Bld: 115 mg/dL — ABNORMAL HIGH (ref 70–99)
Potassium: 3.4 mmol/L — ABNORMAL LOW (ref 3.5–5.1)
Sodium: 137 mmol/L (ref 135–145)

## 2020-12-24 LAB — GLUCOSE, CAPILLARY
Glucose-Capillary: 154 mg/dL — ABNORMAL HIGH (ref 70–99)
Glucose-Capillary: 117 mg/dL — ABNORMAL HIGH (ref 70–99)
Glucose-Capillary: 216 mg/dL — ABNORMAL HIGH (ref 70–99)

## 2020-12-24 LAB — HEPARIN LEVEL (UNFRACTIONATED): Heparin Unfractionated: >1.1 IU/mL — ABNORMAL HIGH (ref 0.30–0.70)

## 2020-12-24 LAB — APTT
aPTT: 34 seconds (ref 24–36)
aPTT: 60 seconds — ABNORMAL HIGH (ref 24–36)

## 2020-12-24 LAB — COOXEMETRY PANEL
Carboxyhemoglobin: 1.3 % (ref 0.5–1.5)
Methemoglobin: 1.1 % (ref 0.0–1.5)
O2 Saturation: 70.6 %
Total hemoglobin: 11.8 g/dL — ABNORMAL LOW (ref 12.0–16.0)

## 2020-12-24 MED ORDER — SENNA 8.6 MG PO TABS
1.0000 | ORAL_TABLET | Freq: Every day | ORAL | Status: DC
  Administered 2020-12-24 – 2020-12-28 (×5): 8.6 mg via ORAL
  Filled 2020-12-24 (×5): qty 1

## 2020-12-24 MED ORDER — HYDROCORTISONE 1 % EX CREA
TOPICAL_CREAM | Freq: Two times a day (BID) | CUTANEOUS | Status: DC | PRN
  Filled 2020-12-24: qty 28

## 2020-12-24 MED ORDER — HYDROCORTISONE 0.5 % EX CREA
TOPICAL_CREAM | Freq: Two times a day (BID) | CUTANEOUS | Status: DC | PRN
  Filled 2020-12-24: qty 28.35

## 2020-12-24 MED ORDER — HEPARIN (PORCINE) 25000 UT/250ML-% IV SOLN
1500.0000 [IU]/h | INTRAVENOUS | Status: AC
  Administered 2020-12-24: 1350 [IU]/h via INTRAVENOUS
  Administered 2020-12-25 (×2): 1550 [IU]/h via INTRAVENOUS
  Administered 2020-12-26 – 2020-12-28 (×3): 1500 [IU]/h via INTRAVENOUS
  Filled 2020-12-24 (×6): qty 250

## 2020-12-24 MED ORDER — POLYETHYLENE GLYCOL 3350 17 G PO PACK
17.0000 g | PACK | Freq: Every day | ORAL | Status: DC
  Administered 2020-12-25 – 2020-12-26 (×2): 17 g via ORAL
  Filled 2020-12-24 (×4): qty 1

## 2020-12-24 MED ORDER — POTASSIUM CHLORIDE CRYS ER 20 MEQ PO TBCR
40.0000 meq | EXTENDED_RELEASE_TABLET | Freq: Once | ORAL | Status: AC
  Administered 2020-12-24: 40 meq via ORAL
  Filled 2020-12-24: qty 2

## 2020-12-24 NOTE — Progress Notes (Signed)
Family Medicine Teaching Service Daily Progress Note Intern Pager: [phone]  Patient name: [name] Medical record number: [id number] Date of birth: [birth year] Age: 65 y.o. Gender: female  Primary Care Provider: [name], NP Consultants: Cardiology Code Status: FULL  Pt Overview and Major Events to Date:  5/1: Admitted 5/4: RHC   Assessment and Plan: [name] a 65 y.o.femalewho presented with CHF exacerbation, now planning for MV and TV replacement. PMH is significant forHFmrEF with EF 45-50%, atrial fibrillation on anticoagulation, GERD, HTN,and T2DM.  Combined systolic and diastolic CHF Exacerbation Severe Mitral and Tricuspid Regurgitation  Continues on Lasix IV 80mg  BID and milrinone gtt per cardiology.  Cr slightly up from 1.88 to 1.95.  UOP 4.5L in last 24 hrs.  This AM without complaint, trace BLE edema above compression wraps.  CVTS planning for MVR/TVR next week.  Orthopantogram without abscess or frank erosion.  Pre-CABG dopplers performed 5/6.  Now on hep gtt for washout of Apixaban prior to surgery. -Cardiology following, appreciate recommendations  Continue milrinone 0.125 today  Continue Lasix 80 mg IV twice daily; can redose metolazone as needed  Stopped Toprol-XL due to low CI  Cont hep gtt prior to surgery, holding Eliquis -CVTS following, appreciate recommendations  Plans for MR/TR repair next week -Add Spiro/SGLT-2 as able; likely not during this admission given creatinine continues to elevate -Fluid restriction 1200cc  Hypokalemia Recurrent in setting of aggressive diuresis.  K 3.4 today.  Goal >4.  - add on Mag - replete K-Dur 42mEq x1  Persistent Atrial Fibrillation on AC HR 92-108 overnight.  Severe MR likely contributing. -Cardiology following, appreciate recommendations -Continue home medications: Digoxin, amiodarone -Cont Hep gtt per above, holding Eliquis  -Keep K>4, Mg>2  T2DM with Neuropathy:  Chronic, stable No longer checking CBG's.  Glucose on BMP 147. -Continue insulin detemir 20U BID -Continue crestor 10 mg daily  -Continue Gabapentin 600 mg BID   HTN: Chronic, stable BP 148/88 this AM. -Holding Toprol-XL 100 mg BID  -Monitor BP's   HLD: chronic, stable -Continue crestor 10 mg daily   GERD: Chronic, stable -Can add back home medication of alka-seltzer as needed.  Depression: chronic, stable -Continue Duloxetine 60 mg daily   Constipation BM recorded on 5/6, denies complaints today. -cont  scheduled MiraLAX daily -cont daily senna  FEN/GI:Heart healthy/carb modified PPx: Heparin gtt    Status is: Inpatient  Remains inpatient appropriate because:IV treatments appropriate due to intensity of illness or inability to take PO, pending CVTS surgery   Dispo: The patient is from: Home              Anticipated d/c is to: Home              Patient currently is not medically stable to d/c.   Difficult to place patient No   Subjective:  Patient denies complaints this AM.  Denies chest pain.  States that she is breathing well.  Overall in good spirits.  Objective: Temp:  [97.6 F (36.4 C)-98.2 F (36.8 C)] 97.6 F (36.4 C) (05/06 2026) Pulse Rate:  [81-93] 92 (05/06 2026) Resp:  [16-18] 18 (05/06 2026) BP: (109-133)/(64-74) 133/66 (05/06 2026) SpO2:  [97 %-100 %] 97 % (05/06 2026) Weight:  [128.5 kg] 128.5 kg (05/06 0435)  Physical Exam:  General: 65 y.o. female in NAD Cardio: RRR, MR and TR murmurs Lungs: CTAB, no wheezing, no rhonchi, no crackles, no IWOB on RA Abdomen: Soft, non-tender to palpation, non-distended Skin: warm and dry Extremities: Trace BLE  edema with compression wrappings  Laboratory: Recent Labs  Lab 12/19/20 1016 12/22/20 1256  WBC 7.1  --   HGB 12.2 13.3  12.9  HCT 39.4 39.0  38.0  PLT 187  --    Recent Labs  Lab 12/19/20 1016 12/20/20 0743 12/22/20 0427 12/22/20 1256 12/23/20 0450 12/24/20 0445  NA 141    < > 138 141  142 136 137  K 3.5   < > 3.8 3.8  3.7 3.3* 3.4*  CL 102   < > 100  --  100 98  CO2 30   < > 24  --  30 31  BUN 16   < > 28*  --  32* 27*  CREATININE 1.68*   < > 1.94*  --  2.00* 1.88*  CALCIUM 9.8   < > 9.0  --  8.8* 9.1  PROT 7.9  --   --   --   --   --   BILITOT 0.9  --   --   --   --   --   ALKPHOS 87  --   --   --   --   --   ALT 12  --   --   --   --   --   AST 19  --   --   --   --   --   GLUCOSE 164*   < > 92  --  136* 115*   < > = values in this interval not displayed.    Imaging/Diagnostic Tests: DG Orthopantogram  Result Date: 12/24/2020 CLINICAL DATA:  Poor dentition EXAM: ORTHOPANTOGRAM/PANORAMIC COMPARISON:  None. FINDINGS: Multiple teeth are missing. Fillings noted in several teeth. No obvious cavity in remaining teeth. No evidence suggesting periapical abscesses in remaining teeth regions. Bony structures appear unremarkable. No fracture or dislocation. No erosion or bony destruction. Periosteal resorption in medial lower right mandible, benign in appearance. IMPRESSION: Multiple teeth missing. No evidence of abscess or frank erosion. There is a degree of periosteal resorption in the medial lower right mandible. No fracture or dislocation. Visualized maxillary antra are clear. Electronically Signed   By: Lowella Grip III M.D.   On: 12/24/2020 11:46   VAS US DOPPLER PRE CABG  Result Date: 12/24/2020 PREOPERATIVE VASCULAR EVALUATION Patient Name:  [name]  Date of Exam:   12/24/2020 Medical Rec #: [id number]             Accession #:    [id number] Date of Birth: [birth year]             Patient Gender: F Patient Age:   89Y Exam Location:  [facility] Procedure:      VAS US DOPPLER PRE CABG Referring Phys: [place] --------------------------------------------------------------------------------  Indications:      Pre-MVR. Risk Factors:     Diabetes. Limitations:      Right restricted arm Comparison Study: No prior studies.  Performing Technologist: Carlos Levering RVT  Examination Guidelines: A complete evaluation includes B-mode imaging, spectral Doppler, color Doppler, and power Doppler as needed of all accessible portions of each vessel. Bilateral testing is considered an integral part of a complete examination. Limited examinations for reoccurring indications may be performed as noted.  Right Carotid Findings: +----------+--------+--------+--------+-----------------------+--------+           PSV cm/sEDV cm/sStenosisDescribe               Comments +----------+--------+--------+--------+-----------------------+--------+ CCA Prox  92      19  smooth and heterogenous         +----------+--------+--------+--------+-----------------------+--------+ CCA Distal106     27              smooth and heterogenous         +----------+--------+--------+--------+-----------------------+--------+ ICA Prox  97      22              smooth and heterogenous         +----------+--------+--------+--------+-----------------------+--------+ ICA Distal81      34                                     tortuous +----------+--------+--------+--------+-----------------------+--------+ ECA       80      10                                              +----------+--------+--------+--------+-----------------------+--------+ Portions of this table do not appear on this page. +----------+--------+-------+--------+------------+           PSV cm/sEDV cmsDescribeArm Pressure +----------+--------+-------+--------+------------+ Subclavian190                                 +----------+--------+-------+--------+------------+ +---------+--------+--+--------+--+---------+ VertebralPSV cm/s45EDV cm/s12Antegrade +---------+--------+--+--------+--+---------+ Left Carotid Findings: +----------+--------+--------+--------+-----------------------+--------+           PSV cm/sEDV cm/sStenosisDescribe                Comments +----------+--------+--------+--------+-----------------------+--------+ CCA Prox  120     22              smooth and heterogenous         +----------+--------+--------+--------+-----------------------+--------+ CCA Distal106     20              smooth and heterogenous         +----------+--------+--------+--------+-----------------------+--------+ ICA Prox  70      18              smooth and heterogenous         +----------+--------+--------+--------+-----------------------+--------+ ICA Distal89      36                                     tortuous +----------+--------+--------+--------+-----------------------+--------+ ECA       103     18                                              +----------+--------+--------+--------+-----------------------+--------+ +----------+--------+--------+--------+------------+ SubclavianPSV cm/sEDV cm/sDescribeArm Pressure +----------+--------+--------+--------+------------+           196                                  +----------+--------+--------+--------+------------+ +---------+--------+--+--------+--+---------+ VertebralPSV cm/s71EDV cm/s18Antegrade +---------+--------+--+--------+--+---------+  ABI Findings: +--------+------------------+-----+--------+--------------+ Right   Rt Pressure (mmHg)IndexWaveformComment        +--------+------------------+-----+--------+--------------+ Brachial                               Restricted arm +--------+------------------+-----+--------+--------------+ +--------+------------------+-----+---------+-------+ Left  Lt Pressure (mmHg)IndexWaveform Comment +--------+------------------+-----+---------+-------+ [id number]                    triphasic        +--------+------------------+-----+---------+-------+  Right Doppler Findings: +--------+--------+-----+-------+--------------+ Site    PressureIndexDopplerComments        +--------+--------+-----+-------+--------------+ Brachial                    Restricted arm +--------+--------+-----+-------+--------------+  Left Doppler Findings: +--------+--------+-----+---------+--------+ Site    PressureIndexDoppler  Comments +--------+--------+-----+---------+--------+ [id number]          triphasic         +--------+--------+-----+---------+--------+ Radial               triphasic         +--------+--------+-----+---------+--------+ Ulnar                triphasic         +--------+--------+-----+---------+--------+  Summary: Right Carotid: Velocities in the right ICA are consistent with a 1-39% stenosis. Left Carotid: Velocities in the left ICA are consistent with a 1-39% stenosis. Vertebrals: Bilateral vertebral arteries demonstrate antegrade flow. Left Upper Extremity: Doppler waveforms remain within normal limits with left radial compression. Doppler waveforms decrease >50% with left ulnar compression.  Electronically signed by Ruta Hinds MD on 12/24/2020 at 5:30:32 PM.    Final      Sheba Whaling, Bernita Raisin, DO 12/24/2020, 9:29 PM PGY-3, [place] Intern pager: [phone], text pages welcome

## 2020-12-24 NOTE — Progress Notes (Signed)
Spoke with Cardiology regarding Eliquis washout needed for MV/TR repair next week. They will take lead to transition patient to Heparin prior to surgery.  Carollee Leitz, MD Family Medicine Residency

## 2020-12-24 NOTE — Progress Notes (Signed)
Heart Failure Nurse Navigator Progress Note  Pt now being evaluated by CVTS team for valve repair by Dr. Roxan Hockey. Will follow from a distance for post surgical needs related to HF/HV TOC.   Navigator available as needed for resources and education.  Pricilla Holm, RN, BSN Heart Failure Nurse Navigator [phone]

## 2020-12-24 NOTE — Progress Notes (Addendum)
Progress Note  Patient Name: [name] Date of Encounter: 12/24/2020  Primary Cardiologist: Buford Dresser, MD  Subjective   Doing well this AM. Seen by TCTS with plans for TR/MR repair next week with Dr. Skipper Cliche   Inpatient Medications    Scheduled Meds: . amiodarone  200 mg Oral BID  . apixaban  5 mg Oral BID  . Chlorhexidine Gluconate Cloth  6 each Topical Daily  . cycloSPORINE  1 drop Both Eyes BID  . digoxin  0.125 mg Oral Daily  . DULoxetine  60 mg Oral Daily  . furosemide  80 mg Intravenous BID  . gabapentin  600 mg Oral BID  . insulin detemir  20 Units Subcutaneous BID  . rosuvastatin  10 mg Oral Daily  . sodium chloride flush  10-40 mL Intracatheter Q12H  . sodium chloride flush  3 mL Intravenous Q12H  . sodium chloride flush  3 mL Intravenous Q12H   Continuous Infusions: . sodium chloride    . milrinone 0.125 mcg/kg/min (12/24/20 0235)   PRN Meds: sodium chloride, acetaminophen, albuterol, menthol-cetylpyridinium, polyethylene glycol, sodium chloride flush, sodium chloride flush   Vital Signs    Vitals:   12/23/20 1648 12/23/20 2009 12/24/20 0027 12/24/20 0435  BP: (!) 136/106 124/71 126/73 109/64  Pulse: 93 88 81 93  Resp:  18 18   Temp:  97.6 F (36.4 C) 97.7 F (36.5 C) 98.2 F (36.8 C)  TempSrc:   Oral Oral  SpO2: 96% 97% 98% 97%  Weight:    128.5 kg  Height:        Intake/Output Summary (Last 24 hours) at 12/24/2020 0643 Last data filed at 12/24/2020 0435 Gross per 24 hour  Intake 1664.42 ml  Output 5400 ml  Net -3735.58 ml   Filed Weights   12/22/20 0356 12/23/20 0405 12/24/20 0435  Weight: 129.8 kg 129.1 kg 128.5 kg    Physical Exam   General: Obese, NAD Neck: Negative for carotid bruits. No JVD Lungs:Clear to ausculation bilaterally. Breathing is unlabored. Cardiovascular: Irregularly irregular. + murmur Abdomen: Soft, non-tender, non-distended. No obvious abdominal masses. Extremities: Difficult to  determine edema. Radial pulses 2+ bilaterally Neuro: Alert and oriented. No focal deficits. No facial asymmetry. MAE spontaneously. Psych: Responds to questions appropriately with normal affect.    Labs    Chemistry Recent Labs  Lab 12/19/20 1016 12/20/20 0743 12/22/20 0427 12/22/20 1256 12/23/20 0450 12/24/20 0445  NA 141   < > 138 141  142 136 137  K 3.5   < > 3.8 3.8  3.7 3.3* 3.4*  CL 102   < > 100  --  100 98  CO2 30   < > 24  --  30 31  GLUCOSE 164*   < > 92  --  136* 115*  BUN 16   < > 28*  --  32* 27*  CREATININE 1.68*   < > 1.94*  --  2.00* 1.88*  CALCIUM 9.8   < > 9.0  --  8.8* 9.1  PROT 7.9  --   --   --   --   --   ALBUMIN 3.4*  --   --   --   --   --   AST 19  --   --   --   --   --   ALT 12  --   --   --   --   --   ALKPHOS 87  --   --   --   --   --  BILITOT 0.9  --   --   --   --   --   GFRNONAA 34*   < > 28*  --  27* 29*  ANIONGAP 9   < > 14  --  6 8   < > = values in this interval not displayed.     Hematology Recent Labs  Lab 12/19/20 1016 12/22/20 1256  WBC 7.1  --   RBC 4.28  --   HGB 12.2 13.3  12.9  HCT 39.4 39.0  38.0  MCV 92.1  --   MCH 28.5  --   MCHC 31.0  --   RDW 14.3  --   PLT 187  --     Cardiac EnzymesNo results for input(s): TROPONINI in the last 168 hours. No results for input(s): TROPIPOC in the last 168 hours.   BNP Recent Labs  Lab 12/19/20 1016  BNP 227.9*     DDimer No results for input(s): DDIMER in the last 168 hours.   Radiology    CARDIAC CATHETERIZATION  Result Date: 12/22/2020 1. Elevated R > L heart filling pressures with PAPi low but not markedly so (1.8) suggestive of a significant component of RV dysfunction. 2. Prominent v-waves in PCWP and RA pressure tracings suggestive of significant MR and TR. 3. Cardiac index low.   DG CHEST PORT 1 VIEW  Result Date: 12/23/2020 CLINICAL DATA:  PICC placement EXAM: PORTABLE CHEST 1 VIEW COMPARISON:  03/02/2020 FINDINGS: Cardiomegaly. Artifact from EKG leads.  Right-sided PICC with tip at the SVC. Interstitial prominence without Kerley lines, effusion, or pneumothorax. IMPRESSION: New PICC with tip at the SVC. Electronically Signed   By: Monte Fantasia M.D.   On: 12/23/2020 09:08   [place] EKG SITE RITE  Result Date: 12/22/2020 If Site Rite image not attached, placement could not be confirmed due to current cardiac rhythm.  Telemetry    12/24/20 AF with stable rates - Personally Reviewed  ECG    No new tracing as of 12/24/20- Personally Reviewed  Cardiac Studies   RHC 12/22/20:  1. Elevated R >L heart filling pressures with PAPi low but not markedly so (1.8) suggestive of a significant component of RV dysfunction.  2. Prominent v-waves in PCWP and RA pressure tracings suggestive of significant MR and TR.  3. Cardiac index low.   Echo 11/26/20:  1. Left ventricular ejection fraction, by estimation, is 45 to 50%. The  left ventricle has mildly decreased function. The left ventricle  demonstrates global hypokinesis.  2. Right ventricular systolic function is moderately reduced. The right  ventricular size is moderately enlarged.  3. Left atrial size was severely dilated. No left atrial/left atrial  appendage thrombus was detected.  4. Right atrial size was severely dilated.  5. MR more appreciable early in the study when blood pressure was normal.  MR jet appears less later in the study, likely due to reduction in blood  pressure. Posterior leaflet appears mildly restricted, with override of  anterior leaflet. Systolic flow  reversal seen in right sided pulmonary veins.. The mitral valve is  abnormal. Severe mitral valve regurgitation. The mean mitral valve  gradient is 2.0 mmHg.  6. Tricuspid valve regurgitation is severe.  7. The aortic valve is tricuspid. Aortic valve regurgitation is not  visualized. No aortic stenosis is present.   Echo 07/09/20:  1. Left ventricular ejection fraction, by estimation, is 45 to 50%. The  left  ventricle has mildly decreased function. The left ventricle  demonstrates global  hypokinesis. Left ventricular diastolic function could  not be evaluated.  2. Right ventricular systolic function was not well visualized. The right  ventricular size is moderately enlarged. There is normal pulmonary artery  systolic pressure.  3. Left atrial size was mildly dilated.  4. Right atrial size was moderately dilated.  5. Eccentric MR jet; appears mild-moderate on parasternal views, but on  apical views (see images 52, 66, 72) extends into distal atrium and may  have Coanda effect. Incompletely visualized and PV not sampled, suspect  moderate to severe MR.. The mitral  valve is rheumatic. Moderate to severe mitral valve regurgitation.  6. Tricuspid valve regurgitation is severe.  7. The aortic valve is tricuspid. Aortic valve regurgitation is not  visualized. No aortic stenosis is present.  8. The inferior vena cava is normal in size with <50% respiratory  variability, suggesting right atrial pressure of 8 mmHg.   Echo 07/02/2018:  - Left ventricle: The cavity size was normal. Systolic function was  mildly reduced. The estimated ejection fraction was in the range  of 45% to 50%. There is moderate hypokinesis of the  entireanteroseptal myocardium.  - Aortic valve: There was mild regurgitation.  - Mitral valve: Mildly dilated, calcified annulus. There was severe  regurgitation.  - Left atrium: The atrium was severely dilated.  - Right ventricle: The cavity size was mildly dilated. Wall  thickness was normal. Systolic function was mildly to moderately  reduced.  - Right atrium: The atrium was severely dilated.  - Tricuspid valve: There was severe regurgitation.  - Pulmonary arteries: Systolic pressure was mildly increased. PA  peak pressure: 31 mm Hg (S).   Cath 12/31/2017:   Prox LAD lesion is 10% stenosed.  Dist LM to Ost LAD lesion is 10% stenosed.  Ost Cx  lesion is 15% stenosed.  The left ventricular systolic function is normal.  LV end diastolic pressure is normal.  Patient Profile     65 y.o. female with a hx of chronic diastolic and systolic HF with mildly reduced LVEF (45-50%),severe MR,atrial fibrillation on eliquis, HTN, and DMIIwho presented with worsening SOB, weight gain and LE edema found to have elevated BNP and pulmonary edema on chest xray consistent with acute on chronic diastolic HF exacerbation.  Assessment & Plan    1. Acute on chronic combined systolic and diastolic CHF: -Patient presented with worsening shortness of breath and weight gain with LE edema found to have a BNP at 2237 and pulmonary edema on CXR -Echocardiogram from 06/2020 with LVEF of 45 to 50% with global hypokinesis, moderate RV enlargement, mild LAE, moderate RAE, severe MR and severe TR with NYHA class III symptoms -Difficult to determine fluid volume status therefore she underwent RHC 12/22/20 which showed elevated filling pressures, low cardiac index, low PAPi. Given this, she was started on low dose milrinone to aide in diuresis -Weight, 283lbwithin admission weight at 285llb  -I&O, net negative 7.2L>>>continues with excellent diuresis  -Creatinine improved to 1.88 today>>down from 2.00 -Continue IV Milrinone with excellent diuresis as above  -Continue with IV Lasix 80mg  BID for now and continue to follow renal function and volume status closely -Creatinine improving, 1.88 today   2. Persistent atrial fibrillation: -Has a history of failed Tikosyn load in the past with brief conversion to NSR however back to atrial fibrillation felt to likely be secondary to severe MR -Continue anticoagulation with Eliquis, amiodarone 200 mg twice daily, digoxin -Likely candidate for Maze procedure with MVR however this will be determined by  thoracic surgery  3. Severe MR/severe TR: -TEE with confirmed severe MR awaiting CV surgery evaluation -Seen by TCTS  12/23/20 with plans for TR/MR repair next week with Dr. Roxan Hockey   4. HLD: -Continue Crestor 10 mg daily  5. DM2: -Hemoglobin A1c, 9.3 -SSI for glucose control -Management per primary team  6. CKD stage IV: -Baseline creatinine appears to be in the 1.6-1.7 range -Creatinine, 1.88 today>>imprpoving with diuresis   Signed, Kathyrn Drown NP-C [place] Pager: [phone] 12/24/2020, 6:43 AM     For questions or updates, please contact   Please consult [email or web address] for contact info under Cardiology/STEMI.  Patient seen and examined and agree with Kathyrn Drown NP-C as detailed above.  In brief, the patient is awith a37 year old female with historyof chronic diastolic and systolic HF with mildly reduced LVEF (45-50%),severe MR,atrial fibrillation on eliquis, HTN, and DMIIwho presented with worsening SOB, weight gain and LE edema found to have elevated BNP and pulmonary edema on chest xray consistent with acute on chronic diastolic HF exacerbation.  Underwent RHC on 12/22/20 which revealed elevated filling pressures, low CI and low PAPi. The patient was started on milrinone for inotropic support with improvement in diuresis and co-ox (currently 70.6 this AM).   Brisk diuresis yesterday; net negative 3.4L.  Cr improving from 2.0-->1.88.  CVP 10 this AM.   GEN:No acute distress.   Neck:Prominent v-waves in JVD in setting of known TR Cardiac:Irregular, 2/6 systolic murmur Respiratory:Clear to auscultation bilaterally. LF:YBOF, nontender, non-distended  BP:ZWCH boots in place; warm Neuro:Nonfocal  Psych: Normal affect   Plan: -Continue milrinone 0.125 for inotropic support today; plan to wean off over the weekend pending co-ox and volume status -Continue lasix 80mg  IV BID; can re-dose metolazone as needed (responded well to 2.5mg ) -S/p PICC line placement; trend CVP and co-ox -Will need close monitoring of lytes with diuresis with goal K>4, Mg>2 -Stopped  metop due to low CI -Unable to tolerate GDMT at this time due to need for inotropic support; will add as able -Changed apixaban to heparin gtt given need for apixaban washout prior to MVR -Continue amiodarone 200mg  BID and digoxin for Afib -Seen by TCTS with plans for TR/MR repair next week with Dr. Corrin Parker, MD

## 2020-12-24 NOTE — Plan of Care (Signed)
?  Problem: Education: ?Goal: Ability to demonstrate management of disease process will improve ?Outcome: Progressing ?  ?Problem: Education: ?Goal: Ability to verbalize understanding of medication therapies will improve ?Outcome: Progressing ?  ?Problem: Cardiac: ?Goal: Ability to achieve and maintain adequate cardiopulmonary perfusion will improve ?Outcome: Progressing ?  ?

## 2020-12-24 NOTE — Progress Notes (Addendum)
Family Medicine Teaching Service Daily Progress Note Intern Pager: [phone]  Patient name: [name] Medical record number: [id number] Date of birth: 12/20/1955 Age: 65 y.o. Gender: female  Primary Care Provider: [name], NP Consultants: Cardiology Code Status: FULL  Pt Overview and Major Events to Date:  5/1: Admitted 5/4: RHC   Assessment and Plan: [name] a 65 y.o.femalewho presented with CHF exacerbation. PMH is significant forHFmrEF with EF 45-50%, atrial fibrillation on anticoagulation, GERD, HTN,and T2DM.  Combined systolic and diastolic CHF Exacerbation Severe Mitral and Tricuspid Regurgitation  RHC demonstrated significant RV dysfunction and significant MR and TR. Creatinine bumped 2>1.88, GFR 27>29. Continues on Lasix, milrinone completed. Weight down 0.5kg but had very substantial UOP yesterday at 4.9L. Total neg -3.3L. CVTS consulted yesterday for evaluation and potential MVR/TVR. Patient is on Eliquis and will require washout-unsure how long she will need this held. -Cardiology following, appreciate recommendations  Continue milrinone 0.125 today  Continue Lasix 80 mg IV twice daily; can redose metolazone as needed  Stopped Toprol-XL due to low CI  Apixaban changed to heparin GTT for washout prior to surgery -CVTS following, appreciate recommendations  Plans for MR/TR repair next week -Add Spiro/SGLT-2 as able; likely not during this admission given creatinine continues to elevate -Fluid restriction 1200cc -40 mEq K+ supplementation   Persistent Atrial Fibrillation on AC HR70's-90's.On AC. Severe MR likely contributing.Digoxin level 0.8, WNL. -Cardiology following, appreciate recommendations -Continue home medications: Digoxin, amiodarone, eliquis -Keep K>4, Mg>2  T2DM with Neuropathy: Chronic, stable No longer checking CBG's. -Continue insulin detemir 20U BID -Continue crestor 10 mg daily  -Continue Gabapentin  600 mg BID   HTN: Chronic, stable BP's ranging109-136/64-106 in last 24 hours.  -Holding Toprol-XL 100 mg BID  -Monitor BP's   HLD: chronic, stable -Continue crestor 10 mg daily   GERD: Chronic, stable -Can add back home medication of alka-seltzer as needed.  Depression: chronic, stable -Continue Duloxetine 60 mg daily   Constipation Reports no bowel movement since 4/30.  As needed MiraLAX was added yesterday. -Changed to scheduled MiraLAX daily -Add daily senna  FEN/GI:Heart healthy/carb modified PPx: Heparin gtt    Status is: Inpatient  Remains inpatient appropriate because:IV treatments appropriate due to intensity of illness or inability to take PO   Dispo: The patient is from: Home              Anticipated d/c is to: Home              Patient currently is not medically stable to d/c.   Difficult to place patient No   Subjective:  Patient states that she is feeling much better today.  States that she was told she would be staying in the hospital until her surgery.  She is comfortable with this plan.  States that she has mild abdominal pain states that she has not moved her bowels since Friday.  Has had no relief with MiraLAX thus far and is amenable to additional medications to help.  She feels that her swelling and breathing have improved.  Objective: Temp:  [97.6 F (36.4 C)-98.2 F (36.8 C)] 98.2 F (36.8 C) (05/06 0435) Pulse Rate:  [72-93] 93 (05/06 0435) Resp:  [18] 18 (05/06 0027) BP: (109-136)/(64-106) 109/64 (05/06 0435) SpO2:  [96 %-100 %] 97 % (05/06 0435) Weight:  [128.5 kg] 128.5 kg (05/06 0435) Physical Exam: General: Awake, alert, no distress, pleasant, conversational Cardiovascular: Irregularly irregular rate Respiratory: Breathing comfortably on room air, without respiratory distress, no wheezing/rhonchi/rales Abdomen: Obese,  mild tenderness to palpation left lower quadrant without rebound/guarding, nondistended, soft Extremities:  Mild pitting edema to knee, Unna boots to bilateral lower extremities  Laboratory: Recent Labs  Lab 12/19/20 1016 12/22/20 1256  WBC 7.1  --   HGB 12.2 13.3  12.9  HCT 39.4 39.0  38.0  PLT 187  --    Recent Labs  Lab 12/19/20 1016 12/20/20 0743 12/22/20 0427 12/22/20 1256 12/23/20 0450 12/24/20 0445  NA 141   < > 138 141  142 136 137  K 3.5   < > 3.8 3.8  3.7 3.3* 3.4*  CL 102   < > 100  --  100 98  CO2 30   < > 24  --  30 31  BUN 16   < > 28*  --  32* 27*  CREATININE 1.68*   < > 1.94*  --  2.00* 1.88*  CALCIUM 9.8   < > 9.0  --  8.8* 9.1  PROT 7.9  --   --   --   --   --   BILITOT 0.9  --   --   --   --   --   ALKPHOS 87  --   --   --   --   --   ALT 12  --   --   --   --   --   AST 19  --   --   --   --   --   GLUCOSE 164*   < > 92  --  136* 115*   < > = values in this interval not displayed.    Imaging/Diagnostic Tests: DG CHEST PORT 1 VIEW  Result Date: 12/23/2020 CLINICAL DATA:  PICC placement EXAM: PORTABLE CHEST 1 VIEW COMPARISON:  03/02/2020 FINDINGS: Cardiomegaly. Artifact from EKG leads. Right-sided PICC with tip at the SVC. Interstitial prominence without Kerley lines, effusion, or pneumothorax. IMPRESSION: New PICC with tip at the SVC. Electronically Signed   By: Monte Fantasia M.D.   On: 12/23/2020 09:08     Sharion Settler, DO 12/24/2020, 7:18 AM PGY-1, [facility] Intern pager: [phone], text pages welcome

## 2020-12-24 NOTE — Care Management Important Message (Signed)
Important Message  Patient Details  Name: [name] MRN: [id number] Date of Birth: [birth year]   Medicare Important Message Given:  Yes     Shelda Altes 12/24/2020, 8:45 AM

## 2020-12-24 NOTE — Plan of Care (Signed)
  Problem: Education: Goal: Ability to demonstrate management of disease process will improve Outcome: Progressing   Problem: Education: Goal: Ability to verbalize understanding of medication therapies will improve Outcome: Progressing   Problem: Activity: Goal: Capacity to carry out activities will improve Outcome: Progressing   Problem: Cardiac: Goal: Ability to achieve and maintain adequate cardiopulmonary perfusion will improve Outcome: Progressing   Problem: Education: Goal: Knowledge of General Education information will improve Description: Including pain rating scale, medication(s)/side effects and non-pharmacologic comfort measures Outcome: Progressing

## 2020-12-24 NOTE — Progress Notes (Signed)
[facility] for heparin (holding apixaban for procedure) Indication: atrial fibrillation  Allergies  Allergen Reactions  . Sulfa Antibiotics Itching    Patient Measurements: Height: 5\' 7"  (170.2 cm) Weight: 128.5 kg (283 lb 4.7 oz) IBW/kg (Calculated) : 61.6 Heparin Dosing Weight: 92.45 kg  Vital Signs: Temp: 97.6 F (36.4 C) (05/06 2026) Temp Source: Oral (05/06 2026) BP: 133/66 (05/06 2026) Pulse Rate: 92 (05/06 2026)  Labs: Recent Labs    12/22/20 0427 12/22/20 1256 12/23/20 0450 12/23/20 0558 12/24/20 0445 12/24/20 0901 12/24/20 2026  HGB  --  13.3  12.9  --   --   --   --   --   HCT  --  39.0  38.0  --   --   --   --   --   APTT  --   --   --   --   --  34 60*  HEPARINUNFRC  --   --   --   --   --  >1.10*  --   CREATININE 1.94*  --  2.00*  --  1.88*  --   --   TROPONINIHS  --   --  6 6  --   --   --     Estimated Creatinine Clearance: 42.2 mL/min (A) (by C-G formula based on SCr of 1.88 mg/dL (H)).   Medical History: Past Medical History:  Diagnosis Date  . Allergic rhinitis   . Arthritis   . Asthma   . Chest pain 12/27/2017  . Chronic diastolic CHF (congestive heart failure) ([place])   . COPD (chronic obstructive pulmonary disease) ([place])   . Depression   . DM (diabetes mellitus) ([place])   . DVT (deep vein thrombosis) in pregnancy   . Dysrhythmia    atrial fibrilation  . GERD (gastroesophageal reflux disease)   . Headache(784.0)   . HTN (hypertension)   . Hyperlipidemia   . Obesity   . Pneumonia 04/2018   RIGHT LOBE  . Shortness of breath   . Sleep apnea    compliant with CPAP    Medications:  Facility-Administered Medications Prior to Admission  Medication Dose Route Frequency Provider Last Rate Last Admin  . [DISCONTINUED] nitroGLYCERIN (NITROSTAT) SL tablet 0.4 mg  0.4 mg Sublingual Q5 min PRN Buford Dresser, MD   0.4 mg at 11/22/20 1414   Medications Prior to Admission  Medication Sig  Dispense Refill Last Dose  . Accu-Chek Softclix Lancets lancets Use as instructed to check blood sugar three times daily. E11.69 100 each 12 Past Week at Unknown time  . albuterol (VENTOLIN HFA) 108 (90 Base) MCG/ACT inhaler Inhale 2 puffs into the lungs every 4 (four) hours as needed for wheezing or shortness of breath.    12/18/2020 at Unknown time  . Alcohol Swabs (B-D SINGLE USE SWABS REGULAR) PADS    Past Week at Unknown time  . amiodarone (PACERONE) 200 MG tablet Take 1 tablet (200 mg total) by mouth 2 (two) times daily. 60 tablet 6 Past Week at Unknown time  . apixaban (ELIQUIS) 5 MG TABS tablet Take 1 tablet (5 mg total) by mouth 2 (two) times daily. 180 tablet 1 Past Week at 6p  . aspirin-sod bicarb-citric acid (ALKA-SELTZER) 325 MG TBEF tablet Take 325 mg by mouth every 6 (six) hours as needed (indigestion).   Past Week at Unknown time  . Blood Glucose Monitoring Suppl (ACCU-CHEK GUIDE ME) w/Device KIT Use as instructed to check blood sugar  three times daily. E11.69 1 kit 0 12/19/2020 at Unknown time  . Blood Glucose Monitoring Suppl (ACCU-CHEK GUIDE) w/Device KIT USE AS INSTRUCTED TO CHECK BLOOD SUGAR THREE TIMES DAILY. E11.69   12/19/2020 at Unknown time  . digoxin (LANOXIN) 0.125 MG tablet Take 0.125 mg by mouth daily.    Past Week at Unknown time  . DULoxetine (CYMBALTA) 60 MG capsule Take 1 capsule (60 mg total) by mouth daily. 90 capsule 3 Past Week at Unknown time  . gabapentin (NEURONTIN) 300 MG capsule Take 2 capsules (600 mg total) by mouth 2 (two) times daily. 360 capsule 1 Past Week at Unknown time  . insulin aspart (NOVOLOG) 100 UNIT/ML injection Inject 12 Units into the skin 3 (three) times daily before meals. For blood sugars 0-199 give 0 units of insulin, 201-250 give 4 units, 251-300 give 6 units, 301-350 give 8 units, 351-400 give 10 units,> 400 give 12 units and call M.D. (Patient taking differently: Inject 5 Units into the skin 3 (three) times daily before meals. For blood sugars  0-199 give 0 units of insulin, 201-250 give 4 units, 251-300 give 6 units, 301-350 give 8 units, 351-400 give 7.) 10 mL 3 Past Week at Unknown time  . LEVEMIR 100 UNIT/ML injection INJECT 0.4 MLS (40 UNITS TOTAL) INTO THE SKIN 2 (TWO) TIMES DAILY. (Patient taking differently: Inject 50 Units into the skin 2 (two) times daily.) 60 mL 1 Past Week at Unknown time  . Multiple Vitamins-Minerals (MULTIVITAMIN WOMEN PO) Take 1 tablet by mouth daily.    Past Week at Unknown time  . nitroGLYCERIN (NITROSTAT) 0.4 MG SL tablet Place 1 tablet (0.4 mg total) under the tongue every 5 (five) minutes x 3 doses as needed for chest pain. 25 tablet 12 12/19/2020 at Unknown time  . TRUE METRIX BLOOD GLUCOSE TEST test strip USE AS INSTRUCTED TO CHECK BLOOD SUGAR DAILY. 100 strip 11 Past Month at Unknown time  . TRULICITY 1.5 HE/5.2DP SOPN INJECT 1.5 MG INTO THE SKIN ONCE A WEEK. 0.5 mL 1 Past Week at Unknown time  . EPINEPHrine 0.3 mg/0.3 mL IJ SOAJ injection Inject 0.3 mg into the muscle as needed for anaphylaxis. (Patient taking differently: Inject 0.3 mg into the muscle as needed for anaphylaxis. Never used) 1 each 1 unk  . metoprolol succinate (TOPROL-XL) 100 MG 24 hr tablet Take 100 mg by mouth in the morning and at bedtime. Take with or immediately following a meal.   12/16/2020 at 6p  . Potassium Chloride ER 20 MEQ TBCR Take 20 mEq by mouth daily. 30 tablet 6 12/16/2020  . RESTASIS 0.05 % ophthalmic emulsion Place 1 drop into both eyes 2 (two) times daily.  12 12/16/2020  . rosuvastatin (CRESTOR) 10 MG tablet Take 1 tablet (10 mg total) by mouth daily. 90 tablet 1 12/16/2020  . torsemide 40 MG TABS Take 40 mg by mouth 2 (two) times daily. 60 tablet 11 12/16/2020    Assessment: CB y.o female presenting with CHF exacerbation. Pt has hx of Afib on Eliquis 5 mg BID, requires washout period for procedures scheduled next week.TEE with severe MR/TR now awaiting repair.  Aptt was below goal tonight at 60s. No bleeding or IV  issues noted.  Goal of Therapy:  Heparin level 0.3-0.7 units/ml aPTT 66-102 seconds Monitor platelets by anticoagulation protocol: Yes   Plan:  Increase IV heparin infusion 1550 units/hr Check anti-Xa level in 6-8 hours and daily while on heparin Continue to monitor H&H and platelets F/u transition  back to apixaban   Erin Hearing PharmD., BCPS Clinical Pharmacist 12/24/2020 10:29 PM

## 2020-12-24 NOTE — Progress Notes (Signed)
ANTICOAGULATION CONSULT NOTE - Initial Consult  Pharmacy Consult for heparin (holding apixaban for procedure) Indication: atrial fibrillation  Allergies  Allergen Reactions  . Sulfa Antibiotics Itching    Patient Measurements: Height: _0  (170.2 cm) Weight: 128.5 kg (283 lb 4.7 oz) IBW/kg (Calculated) : 61.6 Heparin Dosing Weight: 92.45 kg  Vital Signs: Temp: 97.7 F (36.5 C) (05/06 0727) Temp Source: Oral (05/06 0727) BP: 122/74 (05/06 0727) Pulse Rate: 86 (05/06 0727)  Labs: Recent Labs    12/22/20 0427 12/22/20 1256 12/23/20 0450 12/23/20 0558 12/24/20 0445  HGB  --  13.3  12.9  --   --   --   HCT  --  39.0  38.0  --   --   --   CREATININE 1.94*  --  2.00*  --  1.88*  TROPONINIHS  --   --  6 6  --     Estimated Creatinine Clearance: 42.2 mL/min (A) (by C-G formula based on SCr of 1.88 mg/dL (H)).   Medical History: Past Medical History:  Diagnosis Date  . Allergic rhinitis   . Arthritis   . Asthma   . Chest pain 12/27/2017  . Chronic diastolic CHF (congestive heart failure) ([place])   . COPD (chronic obstructive pulmonary disease) ([place])   . Depression   . DM (diabetes mellitus) ([place])   . DVT (deep vein thrombosis) in pregnancy   . Dysrhythmia    atrial fibrilation  . GERD (gastroesophageal reflux disease)   . Headache(784.0)   . HTN (hypertension)   . Hyperlipidemia   . Obesity   . Pneumonia 04/2018   RIGHT LOBE  . Shortness of breath   . Sleep apnea    compliant with CPAP    Medications:  Facility-Administered Medications Prior to Admission  Medication Dose Route Frequency Provider Last Rate Last Admin  . [DISCONTINUED] nitroGLYCERIN (NITROSTAT) SL tablet 0.4 mg  0.4 mg Sublingual Q5 min PRN Buford Dresser, MD   0.4 mg at 11/22/20 1414   Medications Prior to Admission  Medication Sig Dispense Refill Last Dose  . Accu-Chek Softclix Lancets lancets Use as instructed to check blood sugar three times daily. E11.69 100 each 12 Past Week  at Unknown time  . albuterol (VENTOLIN HFA) 108 (90 Base) MCG/ACT inhaler Inhale 2 puffs into the lungs every 4 (four) hours as needed for wheezing or shortness of breath.    12/18/2020 at Unknown time  . Alcohol Swabs (B-D SINGLE USE SWABS REGULAR) PADS    Past Week at Unknown time  . amiodarone (PACERONE) 200 MG tablet Take 1 tablet (200 mg total) by mouth 2 (two) times daily. 60 tablet 6 Past Week at Unknown time  . apixaban (ELIQUIS) 5 MG TABS tablet Take 1 tablet (5 mg total) by mouth 2 (two) times daily. 180 tablet 1 Past Week at 6p  . aspirin-sod bicarb-citric acid (ALKA-SELTZER) 325 MG TBEF tablet Take 325 mg by mouth every 6 (six) hours as needed (indigestion).   Past Week at Unknown time  . Blood Glucose Monitoring Suppl (ACCU-CHEK GUIDE ME) w/Device KIT Use as instructed to check blood sugar three times daily. E11.69 1 kit 0 12/19/2020 at Unknown time  . Blood Glucose Monitoring Suppl (ACCU-CHEK GUIDE) w/Device KIT USE AS INSTRUCTED TO CHECK BLOOD SUGAR THREE TIMES DAILY. E11.69   12/19/2020 at Unknown time  . digoxin (LANOXIN) 0.125 MG tablet Take 0.125 mg by mouth daily.    Past Week at Unknown time  . DULoxetine (CYMBALTA) 60 MG capsule  Take 1 capsule (60 mg total) by mouth daily. 90 capsule 3 Past Week at Unknown time  . gabapentin (NEURONTIN) 300 MG capsule Take 2 capsules (600 mg total) by mouth 2 (two) times daily. 360 capsule 1 Past Week at Unknown time  . insulin aspart (NOVOLOG) 100 UNIT/ML injection Inject 12 Units into the skin 3 (three) times daily before meals. For blood sugars 0-199 give 0 units of insulin, 201-250 give 4 units, 251-300 give 6 units, 301-350 give 8 units, 351-400 give 10 units,> 400 give 12 units and call M.D. (Patient taking differently: Inject 5 Units into the skin 3 (three) times daily before meals. For blood sugars 0-199 give 0 units of insulin, 201-250 give 4 units, 251-300 give 6 units, 301-350 give 8 units, 351-400 give 7.) 10 mL 3 Past Week at Unknown time  .  LEVEMIR 100 UNIT/ML injection INJECT 0.4 MLS (40 UNITS TOTAL) INTO THE SKIN 2 (TWO) TIMES DAILY. (Patient taking differently: Inject 50 Units into the skin 2 (two) times daily.) 60 mL 1 Past Week at Unknown time  . Multiple Vitamins-Minerals (MULTIVITAMIN WOMEN PO) Take 1 tablet by mouth daily.    Past Week at Unknown time  . nitroGLYCERIN (NITROSTAT) 0.4 MG SL tablet Place 1 tablet (0.4 mg total) under the tongue every 5 (five) minutes x 3 doses as needed for chest pain. 25 tablet 12 12/19/2020 at Unknown time  . TRUE METRIX BLOOD GLUCOSE TEST test strip USE AS INSTRUCTED TO CHECK BLOOD SUGAR DAILY. 100 strip 11 Past Month at Unknown time  . TRULICITY 1.5 EX/6.1YJ SOPN INJECT 1.5 MG INTO THE SKIN ONCE A WEEK. 0.5 mL 1 Past Week at Unknown time  . EPINEPHrine 0.3 mg/0.3 mL IJ SOAJ injection Inject 0.3 mg into the muscle as needed for anaphylaxis. (Patient taking differently: Inject 0.3 mg into the muscle as needed for anaphylaxis. Never used) 1 each 1 unk  . metoprolol succinate (TOPROL-XL) 100 MG 24 hr tablet Take 100 mg by mouth in the morning and at bedtime. Take with or immediately following a meal.   12/16/2020 at 6p  . Potassium Chloride ER 20 MEQ TBCR Take 20 mEq by mouth daily. 30 tablet 6 12/16/2020  . RESTASIS 0.05 % ophthalmic emulsion Place 1 drop into both eyes 2 (two) times daily.  12 12/16/2020  . rosuvastatin (CRESTOR) 10 MG tablet Take 1 tablet (10 mg total) by mouth daily. 90 tablet 1 12/16/2020  . torsemide 40 MG TABS Take 40 mg by mouth 2 (two) times daily. 60 tablet 11 12/16/2020    Assessment: CB y.o female presenting with CHF exacerbation. Pt has hx of Afib on Eliquis 5 mg BID, requires washout period for procedures scheduled next week.TEE with severe MR/TR now awaiting repair.  Today: Last Eliquis dose 5/5 2138. Scr 1.88 - trending down. Hgb 13.3 - stable. PLTs 187 (will monitor). Baseline APTT 34, and Heparin level >1.10 as expected d/t recent apixaban doses.  Goal of Therapy:   Heparin level 0.3-0.7 units/ml aPTT 66-102 seconds Monitor platelets by anticoagulation protocol: Yes   Plan:  Hold apixaban  Start heparin infusion at 1350 units/hr Check anti-Xa level in 6-8 hours and daily while on heparin Continue to monitor H&H and platelets F/u transition back to apixaban   Debria Garret, Pharm Student 12/24/2020,9:36 AM

## 2020-12-24 NOTE — Progress Notes (Signed)
Pre-MVR testing has been completed. Preliminary results can be found in CV Proc through chart review.   12/24/20 3:15 PM Carlos Levering RVT

## 2020-12-25 DIAGNOSIS — I5023 Acute on chronic systolic (congestive) heart failure: Secondary | ICD-10-CM | POA: Diagnosis not present

## 2020-12-25 DIAGNOSIS — I5082 Biventricular heart failure: Secondary | ICD-10-CM

## 2020-12-25 DIAGNOSIS — I340 Nonrheumatic mitral (valve) insufficiency: Secondary | ICD-10-CM | POA: Diagnosis not present

## 2020-12-25 DIAGNOSIS — I509 Heart failure, unspecified: Secondary | ICD-10-CM | POA: Diagnosis not present

## 2020-12-25 DIAGNOSIS — I071 Rheumatic tricuspid insufficiency: Secondary | ICD-10-CM

## 2020-12-25 DIAGNOSIS — I4821 Permanent atrial fibrillation: Secondary | ICD-10-CM | POA: Diagnosis not present

## 2020-12-25 LAB — BASIC METABOLIC PANEL
Anion gap: 8 (ref 5–15)
BUN: 30 mg/dL — ABNORMAL HIGH (ref 8–23)
CO2: 32 mmol/L (ref 22–32)
Calcium: 9.1 mg/dL (ref 8.9–10.3)
Chloride: 97 mmol/L — ABNORMAL LOW (ref 98–111)
Creatinine, Ser: 1.95 mg/dL — ABNORMAL HIGH (ref 0.44–1.00)
GFR, Estimated: 28 mL/min — ABNORMAL LOW (ref >60)
Glucose, Bld: 147 mg/dL — ABNORMAL HIGH (ref 70–99)
Potassium: 3.4 mmol/L — ABNORMAL LOW (ref 3.5–5.1)
Sodium: 137 mmol/L (ref 135–145)

## 2020-12-25 LAB — GLUCOSE, CAPILLARY
Glucose-Capillary: 171 mg/dL — ABNORMAL HIGH (ref 70–99)
Glucose-Capillary: 140 mg/dL — ABNORMAL HIGH (ref 70–99)
Glucose-Capillary: 315 mg/dL — ABNORMAL HIGH (ref 70–99)

## 2020-12-25 LAB — CBC
HCT: 38.1 % (ref 36.0–46.0)
Hemoglobin: 12.2 g/dL (ref 12.0–15.0)
MCH: 28.5 pg (ref 26.0–34.0)
MCHC: 32 g/dL (ref 30.0–36.0)
MCV: 89 fL (ref 80.0–100.0)
Platelets: 224 10*3/uL (ref 150–400)
RBC: 4.28 MIL/uL (ref 3.87–5.11)
RDW: 14.2 % (ref 11.5–15.5)
WBC: 9.4 10*3/uL (ref 4.0–10.5)
nRBC: 0 % (ref 0.0–0.2)

## 2020-12-25 LAB — HEPARIN LEVEL (UNFRACTIONATED)
Heparin Unfractionated: >1.1 IU/mL — ABNORMAL HIGH (ref 0.30–0.70)
Heparin Unfractionated: >1.1 IU/mL — ABNORMAL HIGH (ref 0.30–0.70)

## 2020-12-25 LAB — COOXEMETRY PANEL
Carboxyhemoglobin: 1.4 % (ref 0.5–1.5)
Methemoglobin: 0.7 % (ref 0.0–1.5)
O2 Saturation: 85.7 %
Total hemoglobin: 12.2 g/dL (ref 12.0–16.0)

## 2020-12-25 LAB — APTT
aPTT: 91 seconds — ABNORMAL HIGH (ref 24–36)
aPTT: >200 seconds (ref 24–36)
aPTT: 84 seconds — ABNORMAL HIGH (ref 24–36)
aPTT: 88 seconds — ABNORMAL HIGH (ref 24–36)

## 2020-12-25 LAB — MAGNESIUM: Magnesium: 1.8 mg/dL (ref 1.7–2.4)

## 2020-12-25 MED ORDER — POTASSIUM CHLORIDE CRYS ER 20 MEQ PO TBCR
40.0000 meq | EXTENDED_RELEASE_TABLET | Freq: Once | ORAL | Status: AC
  Administered 2020-12-25: 40 meq via ORAL
  Filled 2020-12-25: qty 2

## 2020-12-25 MED ORDER — MAGNESIUM SULFATE 2 GM/50ML IV SOLN
2.0000 g | Freq: Once | INTRAVENOUS | Status: AC
  Administered 2020-12-25: 2 g via INTRAVENOUS
  Filled 2020-12-25: qty 50

## 2020-12-25 NOTE — Progress Notes (Signed)
Progress Note  Patient Name: Katheline Brendlinger Date of Encounter: 12/25/2020  [facility] Cardiologist: Buford Dresser, MD   Subjective   Improved. Can lie flat, but not for long periods of time. 4.5 l urine output, net diuresis 2.6 liters/24h, 9.5 liters since admission. Weight change less significant:  down 4 lb since yesterday, 7 lb overall. Creatinine inching back up, but comparable to baseline (1.4-2.0 over last year).  Inpatient Medications    Scheduled Meds: . amiodarone  200 mg Oral BID  . Chlorhexidine Gluconate Cloth  6 each Topical Daily  . cycloSPORINE  1 drop Both Eyes BID  . digoxin  0.125 mg Oral Daily  . DULoxetine  60 mg Oral Daily  . furosemide  80 mg Intravenous BID  . gabapentin  600 mg Oral BID  . insulin detemir  20 Units Subcutaneous BID  . polyethylene glycol  17 g Oral Daily  . rosuvastatin  10 mg Oral Daily  . senna  1 tablet Oral Daily  . sodium chloride flush  10-40 mL Intracatheter Q12H  . sodium chloride flush  3 mL Intravenous Q12H  . sodium chloride flush  3 mL Intravenous Q12H   Continuous Infusions: . sodium chloride    . heparin 1,550 Units/hr (12/25/20 0523)  . milrinone 0.125 mcg/kg/min (12/25/20 0541)   PRN Meds: sodium chloride, acetaminophen, albuterol, hydrocortisone cream, menthol-cetylpyridinium, sodium chloride flush, sodium chloride flush   Vital Signs    Vitals:   12/24/20 2200 12/24/20 2326 12/25/20 0341 12/25/20 0627  BP:  115/75 (!) 148/88   Pulse:  (!) 108 (!) 105   Resp:  18 18   Temp:  98.4 F (36.9 C) 98.4 F (36.9 C)   TempSrc:  Oral Oral   SpO2:  96% 94%   Weight:    126.6 kg  Height: 5\' 7"  (1.702 m)       Intake/Output Summary (Last 24 hours) at 12/25/2020 1119 Last data filed at 12/25/2020 0900 Gross per 24 hour  Intake 1431.83 ml  Output 3427 ml  Net -1995.17 ml   Last 3 Weights 12/25/2020 12/24/2020 12/23/2020  Weight (lbs) 279 lb 1.6 oz 283 lb 4.7 oz 284 lb 9.8 oz  Weight (kg) 126.6 kg  128.5 kg 129.1 kg  Some encounter information is confidential and restricted. Go to Review Flowsheets activity to see all data.      Telemetry    AFib with borderline rate control (90-100 at rest, 120-130 with walking) - Personally Reviewed  ECG    AFib, PRWP - Personally Reviewed  Physical Exam  Obese GEN: No acute distress.   Neck: unable to see JVD Cardiac: irregular, short systolic murmur LLSB, rubs, or gallops.  Respiratory: Clear to auscultation bilaterally. GI: Soft, nontender, non-distended  MS: No edema; No deformity. Neuro:  Nonfocal  Psych: Normal affect   Labs    High Sensitivity Troponin:   Recent Labs  Lab 12/19/20 1016 12/19/20 1216 12/23/20 0450 12/23/20 0558  TROPONINIHS 8 12 6 6       Chemistry Recent Labs  Lab 12/19/20 1016 12/20/20 0743 12/23/20 0450 12/24/20 0445 12/25/20 0500  NA 141   < > 136 137 137  K 3.5   < > 3.3* 3.4* 3.4*  CL 102   < > 100 98 97*  CO2 30   < > 30 31 32  GLUCOSE 164*   < > 136* 115* 147*  BUN 16   < > 32* 27* 30*  CREATININE 1.68*   < >  2.00* 1.88* 1.95*  CALCIUM 9.8   < > 8.8* 9.1 9.1  PROT 7.9  --   --   --   --   ALBUMIN 3.4*  --   --   --   --   AST 19  --   --   --   --   ALT 12  --   --   --   --   ALKPHOS 87  --   --   --   --   BILITOT 0.9  --   --   --   --   GFRNONAA 34*   < > 27* 29* 28*  ANIONGAP 9   < > 6 8 8    < > = values in this interval not displayed.     Hematology Recent Labs  Lab 12/19/20 1016 12/22/20 1256 12/25/20 0500  WBC 7.1  --  9.4  RBC 4.28  --  4.28  HGB 12.2 13.3  12.9 12.2  HCT 39.4 39.0  38.0 38.1  MCV 92.1  --  89.0  MCH 28.5  --  28.5  MCHC 31.0  --  32.0  RDW 14.3  --  14.2  PLT 187  --  224    BNP Recent Labs  Lab 12/19/20 1016  BNP 227.9*     DDimer No results for input(s): DDIMER in the last 168 hours.   Radiology    DG Orthopantogram  Result Date: 12/24/2020 CLINICAL DATA:  Poor dentition EXAM: ORTHOPANTOGRAM/PANORAMIC COMPARISON:  None.  FINDINGS: Multiple teeth are missing. Fillings noted in several teeth. No obvious cavity in remaining teeth. No evidence suggesting periapical abscesses in remaining teeth regions. Bony structures appear unremarkable. No fracture or dislocation. No erosion or bony destruction. Periosteal resorption in medial lower right mandible, benign in appearance. IMPRESSION: Multiple teeth missing. No evidence of abscess or frank erosion. There is a degree of periosteal resorption in the medial lower right mandible. No fracture or dislocation. Visualized maxillary antra are clear. Electronically Signed   By: Lowella Grip III M.D.   On: 12/24/2020 11:46   VAS US DOPPLER PRE CABG  Result Date: 12/24/2020 PREOPERATIVE VASCULAR EVALUATION Patient Name:  KEAMBER MACFADDEN  Date of Exam:   12/24/2020 Medical Rec #: [id number]             Accession #:    [id number] Date of Birth: 04/30/1956             Patient Gender: F Patient Age:   65Y Exam Location:  [facility] Procedure:      VAS US DOPPLER PRE CABG Referring Phys: [place] --------------------------------------------------------------------------------  Indications:      Pre-MVR. Risk Factors:     Diabetes. Limitations:      Right restricted arm Comparison Study: No prior studies. Performing Technologist: Carlos Levering RVT  Examination Guidelines: A complete evaluation includes B-mode imaging, spectral Doppler, color Doppler, and power Doppler as needed of all accessible portions of each vessel. Bilateral testing is considered an integral part of a complete examination. Limited examinations for reoccurring indications may be performed as noted.  Right Carotid Findings: +----------+--------+--------+--------+-----------------------+--------+           PSV cm/sEDV cm/sStenosisDescribe               Comments +----------+--------+--------+--------+-----------------------+--------+ CCA Prox  92      19              smooth and  heterogenous         +----------+--------+--------+--------+-----------------------+--------+  CCA Distal106     27              smooth and heterogenous         +----------+--------+--------+--------+-----------------------+--------+ ICA Prox  97      22              smooth and heterogenous         +----------+--------+--------+--------+-----------------------+--------+ ICA Distal81      34                                     tortuous +----------+--------+--------+--------+-----------------------+--------+ ECA       80      10                                              +----------+--------+--------+--------+-----------------------+--------+ Portions of this table do not appear on this page. +----------+--------+-------+--------+------------+           PSV cm/sEDV cmsDescribeArm Pressure +----------+--------+-------+--------+------------+ Subclavian190                                 +----------+--------+-------+--------+------------+ +---------+--------+--+--------+--+---------+ VertebralPSV cm/s45EDV cm/s12Antegrade +---------+--------+--+--------+--+---------+ Left Carotid Findings: +----------+--------+--------+--------+-----------------------+--------+           PSV cm/sEDV cm/sStenosisDescribe               Comments +----------+--------+--------+--------+-----------------------+--------+ CCA Prox  120     22              smooth and heterogenous         +----------+--------+--------+--------+-----------------------+--------+ CCA Distal106     20              smooth and heterogenous         +----------+--------+--------+--------+-----------------------+--------+ ICA Prox  70      18              smooth and heterogenous         +----------+--------+--------+--------+-----------------------+--------+ ICA Distal89      36                                     tortuous  +----------+--------+--------+--------+-----------------------+--------+ ECA       103     18                                              +----------+--------+--------+--------+-----------------------+--------+ +----------+--------+--------+--------+------------+ SubclavianPSV cm/sEDV cm/sDescribeArm Pressure +----------+--------+--------+--------+------------+           196                                  +----------+--------+--------+--------+------------+ +---------+--------+--+--------+--+---------+ VertebralPSV cm/s71EDV cm/s18Antegrade +---------+--------+--+--------+--+---------+  ABI Findings: +--------+------------------+-----+--------+--------------+ Right   Rt Pressure (mmHg)IndexWaveformComment        +--------+------------------+-----+--------+--------------+ Brachial                               Restricted arm +--------+------------------+-----+--------+--------------+ +--------+------------------+-----+---------+-------+ Left    Lt Pressure (mmHg)IndexWaveform Comment +--------+------------------+-----+---------+-------+ [id number]  triphasic        +--------+------------------+-----+---------+-------+  Right Doppler Findings: +--------+--------+-----+-------+--------------+ Site    PressureIndexDopplerComments       +--------+--------+-----+-------+--------------+ Brachial                    Restricted arm +--------+--------+-----+-------+--------------+  Left Doppler Findings: +--------+--------+-----+---------+--------+ Site    PressureIndexDoppler  Comments +--------+--------+-----+---------+--------+ [id number]          triphasic         +--------+--------+-----+---------+--------+ Radial               triphasic         +--------+--------+-----+---------+--------+ Ulnar                triphasic         +--------+--------+-----+---------+--------+  Summary: Right Carotid: Velocities in the  right ICA are consistent with a 1-39% stenosis. Left Carotid: Velocities in the left ICA are consistent with a 1-39% stenosis. Vertebrals: Bilateral vertebral arteries demonstrate antegrade flow. Left Upper Extremity: Doppler waveforms remain within normal limits with left radial compression. Doppler waveforms decrease >50% with left ulnar compression.  Electronically signed by Ruta Hinds MD on 12/24/2020 at 5:30:32 PM.    Final     Cardiac Studies   RHC 12/22/20:  1. Elevated R >L heart filling pressures with PAPi low but not markedly so (1.8) suggestive of a significant component of RV dysfunction.  2. Prominent v-waves in PCWP and RA pressure tracings suggestive of significant MR and TR.  3. Cardiac index low.  Echo4/8/22:  1. Left ventricular ejection fraction, by estimation, is 45 to 50%. The  left ventricle has mildly decreased function. The left ventricle  demonstrates global hypokinesis.  2. Right ventricular systolic function is moderately reduced. The right  ventricular size is moderately enlarged.  3. Left atrial size was severely dilated. No left atrial/left atrial  appendage thrombus was detected.  4. Right atrial size was severely dilated.  5. MR more appreciable early in the study when blood pressure was normal.  MR jet appears less later in the study, likely due to reduction in blood  pressure. Posterior leaflet appears mildly restricted, with override of  anterior leaflet. Systolic flow  reversal seen in right sided pulmonary veins.. The mitral valve is  abnormal. Severe mitral valve regurgitation. The mean mitral valve  gradient is 2.0 mmHg.  6. Tricuspid valve regurgitation is severe.  7. The aortic valve is tricuspid. Aortic valve regurgitation is not  visualized. No aortic stenosis is present.   Echo 07/09/20:  1. Left ventricular ejection fraction, by estimation, is 45 to 50%. The  left ventricle has mildly decreased function. The left ventricle   demonstrates global hypokinesis. Left ventricular diastolic function could  not be evaluated.  2. Right ventricular systolic function was not well visualized. The right  ventricular size is moderately enlarged. There is normal pulmonary artery  systolic pressure.  3. Left atrial size was mildly dilated.  4. Right atrial size was moderately dilated.  5. Eccentric MR jet; appears mild-moderate on parasternal views, but on  apical views (see images 52, 66, 72) extends into distal atrium and may  have Coanda effect. Incompletely visualized and PV not sampled, suspect  moderate to severe MR.. The mitral  valve is rheumatic. Moderate to severe mitral valve regurgitation.  6. Tricuspid valve regurgitation is severe.  7. The aortic valve is tricuspid. Aortic valve regurgitation is not  visualized. No aortic stenosis is present.  8. The inferior vena cava is normal  in size with <50% respiratory  variability, suggesting right atrial pressure of 8 mmHg.   Echo 07/02/2018:  - Left ventricle: The cavity size was normal. Systolic function was  mildly reduced. The estimated ejection fraction was in the range  of 45% to 50%. There is moderate hypokinesis of the  entireanteroseptal myocardium.  - Aortic valve: There was mild regurgitation.  - Mitral valve: Mildly dilated, calcified annulus. There was severe  regurgitation.  - Left atrium: The atrium was severely dilated.  - Right ventricle: The cavity size was mildly dilated. Wall  thickness was normal. Systolic function was mildly to moderately  reduced.  - Right atrium: The atrium was severely dilated.  - Tricuspid valve: There was severe regurgitation.  - Pulmonary arteries: Systolic pressure was mildly increased. PA  peak pressure: 31 mm Hg (S).   Cath 12/31/2017:   Prox LAD lesion is 10% stenosed.  Dist LM to Ost LAD lesion is 10% stenosed.  Ost Cx lesion is 15% stenosed.  The left ventricular systolic  function is normal.  LV end diastolic pressure is normal.   Patient Profile     65 y.o. 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 Acute on chronic combined systolic and diastolic CHF: -improving, may need one more day of IV diuresis, but no metolazone today -On IV Milrinone. Excellent CoOx 85%. Suggests improving RV output with diuresis. Can probably DC milrinone tomorrow -Creatinine back up to 1.95, not a meaningful change  2. Persistent atrial fibrillation: -failed Tikosyn -borderline rate control -Continue anticoagulation with Eliquis, amiodarone 200 mg twice daily, digoxin -Likely candidate for Maze procedure with MVR however this will be determined by thoracic surgery  3. Severe MR/severe TR: - plans for TR/MR repair next week with Dr. Roxan Hockey  - RV dysfunction is likely to be a serious issue postop  4. HLD: -Continue Crestor 10 mg daily  5. DM2: -Hemoglobin A1c, 9.3 -SSI for glucose control -Management per primary team  6. CKD stage IV: -Baseline creatinine appears to be in the 1.6-1.7 range -Creatinine,1.95today      For questions or updates, please contact [place] Please consult [email or web address] for contact info under        Signed, Sanda Klein, MD  12/25/2020, 11:19 AM

## 2020-12-25 NOTE — Progress Notes (Signed)
[place] for Heparin (holding apixaban for procedure) Indication: atrial fibrillation  Allergies  Allergen Reactions  . Sulfa Antibiotics Itching    Patient Measurements: Height: 5\' 7"  (170.2 cm) Weight: 126.6 kg (279 lb 1.6 oz) IBW/kg (Calculated) : 61.6 Heparin Dosing Weight: 92.45 kg  Vital Signs: Temp: 97.8 F (36.6 C) (05/07 1609) Temp Source: Oral (05/07 1609) BP: 116/89 (05/07 1609) Pulse Rate: 102 (05/07 1609)  Labs: Recent Labs    12/23/20 0450 12/23/20 0558 12/24/20 0445 12/24/20 0901 12/24/20 2026 12/25/20 0500 12/25/20 1424 12/25/20 1543  HGB  --   --   --   --   --  12.2  --   --   HCT  --   --   --   --   --  38.1  --   --   PLT  --   --   --   --   --  224  --   --   APTT  --   --   --  34   < > 91* >200* 84*  HEPARINUNFRC  --   --   --  >1.10*  --  >1.10*  --   --   CREATININE 2.00*  --  1.88*  --   --  1.95*  --   --   TROPONINIHS 6 6  --   --   --   --   --   --    < > = values in this interval not displayed.    Estimated Creatinine Clearance: 40.3 mL/min (A) (by C-G formula based on SCr of 1.95 mg/dL (H)).   Medical History: Past Medical History:  Diagnosis Date  . Allergic rhinitis   . Arthritis   . Asthma   . Chest pain 12/27/2017  . Chronic diastolic CHF (congestive heart failure) ([place])   . COPD (chronic obstructive pulmonary disease) ([place])   . Depression   . DM (diabetes mellitus) ([place])   . DVT (deep vein thrombosis) in pregnancy   . Dysrhythmia    atrial fibrilation  . GERD (gastroesophageal reflux disease)   . Headache(784.0)   . HTN (hypertension)   . Hyperlipidemia   . Obesity   . Pneumonia 04/2018   RIGHT LOBE  . Shortness of breath   . Sleep apnea    compliant with CPAP    Medications:  Facility-Administered Medications Prior to Admission  Medication Dose Route Frequency Provider Last Rate Last Admin  . [DISCONTINUED] nitroGLYCERIN (NITROSTAT) SL tablet 0.4 mg  0.4 mg  Sublingual Q5 min PRN Buford Dresser, MD   0.4 mg at 11/22/20 1414   Medications Prior to Admission  Medication Sig Dispense Refill Last Dose  . Accu-Chek Softclix Lancets lancets Use as instructed to check blood sugar three times daily. E11.69 100 each 12 Past Week at Unknown time  . albuterol (VENTOLIN HFA) 108 (90 Base) MCG/ACT inhaler Inhale 2 puffs into the lungs every 4 (four) hours as needed for wheezing or shortness of breath.    12/18/2020 at Unknown time  . Alcohol Swabs (B-D SINGLE USE SWABS REGULAR) PADS    Past Week at Unknown time  . amiodarone (PACERONE) 200 MG tablet Take 1 tablet (200 mg total) by mouth 2 (two) times daily. 60 tablet 6 Past Week at Unknown time  . apixaban (ELIQUIS) 5 MG TABS tablet Take 1 tablet (5 mg total) by mouth 2 (two) times daily. 180 tablet 1 Past Week at 6p  .  aspirin-sod bicarb-citric acid (ALKA-SELTZER) 325 MG TBEF tablet Take 325 mg by mouth every 6 (six) hours as needed (indigestion).   Past Week at Unknown time  . Blood Glucose Monitoring Suppl (ACCU-CHEK GUIDE ME) w/Device KIT Use as instructed to check blood sugar three times daily. E11.69 1 kit 0 12/19/2020 at Unknown time  . Blood Glucose Monitoring Suppl (ACCU-CHEK GUIDE) w/Device KIT USE AS INSTRUCTED TO CHECK BLOOD SUGAR THREE TIMES DAILY. E11.69   12/19/2020 at Unknown time  . digoxin (LANOXIN) 0.125 MG tablet Take 0.125 mg by mouth daily.    Past Week at Unknown time  . DULoxetine (CYMBALTA) 60 MG capsule Take 1 capsule (60 mg total) by mouth daily. 90 capsule 3 Past Week at Unknown time  . gabapentin (NEURONTIN) 300 MG capsule Take 2 capsules (600 mg total) by mouth 2 (two) times daily. 360 capsule 1 Past Week at Unknown time  . insulin aspart (NOVOLOG) 100 UNIT/ML injection Inject 12 Units into the skin 3 (three) times daily before meals. For blood sugars 0-199 give 0 units of insulin, 201-250 give 4 units, 251-300 give 6 units, 301-350 give 8 units, 351-400 give 10 units,> 400 give 12  units and call M.D. (Patient taking differently: Inject 5 Units into the skin 3 (three) times daily before meals. For blood sugars 0-199 give 0 units of insulin, 201-250 give 4 units, 251-300 give 6 units, 301-350 give 8 units, 351-400 give 7.) 10 mL 3 Past Week at Unknown time  . LEVEMIR 100 UNIT/ML injection INJECT 0.4 MLS (40 UNITS TOTAL) INTO THE SKIN 2 (TWO) TIMES DAILY. (Patient taking differently: Inject 50 Units into the skin 2 (two) times daily.) 60 mL 1 Past Week at Unknown time  . Multiple Vitamins-Minerals (MULTIVITAMIN WOMEN PO) Take 1 tablet by mouth daily.    Past Week at Unknown time  . nitroGLYCERIN (NITROSTAT) 0.4 MG SL tablet Place 1 tablet (0.4 mg total) under the tongue every 5 (five) minutes x 3 doses as needed for chest pain. 25 tablet 12 12/19/2020 at Unknown time  . TRUE METRIX BLOOD GLUCOSE TEST test strip USE AS INSTRUCTED TO CHECK BLOOD SUGAR DAILY. 100 strip 11 Past Month at Unknown time  . TRULICITY 1.5 NI/7.7OE SOPN INJECT 1.5 MG INTO THE SKIN ONCE A WEEK. 0.5 mL 1 Past Week at Unknown time  . EPINEPHrine 0.3 mg/0.3 mL IJ SOAJ injection Inject 0.3 mg into the muscle as needed for anaphylaxis. (Patient taking differently: Inject 0.3 mg into the muscle as needed for anaphylaxis. Never used) 1 each 1 unk  . metoprolol succinate (TOPROL-XL) 100 MG 24 hr tablet Take 100 mg by mouth in the morning and at bedtime. Take with or immediately following a meal.   12/16/2020 at 6p  . Potassium Chloride ER 20 MEQ TBCR Take 20 mEq by mouth daily. 30 tablet 6 12/16/2020  . RESTASIS 0.05 % ophthalmic emulsion Place 1 drop into both eyes 2 (two) times daily.  12 12/16/2020  . rosuvastatin (CRESTOR) 10 MG tablet Take 1 tablet (10 mg total) by mouth daily. 90 tablet 1 12/16/2020  . torsemide 40 MG TABS Take 40 mg by mouth 2 (two) times daily. 60 tablet 11 12/16/2020    Assessment: [age] y.o female presenting with CHF exacerbation. Pt has hx of Afib on Eliquis 5 mg BID, requires washout period for  procedures scheduled next week.TEE with severe MR/TR now awaiting repair.  Confirmatory aPTT remains therapeutic.  Goal of Therapy:  Heparin level 0.3-0.7 units/ml aPTT 66-102  seconds Monitor platelets by anticoagulation protocol: Yes   Plan:  Cont heparin 1550 units/hr Daily aPTT  Arrie Senate, PharmD, BCPS, [facility] Clinical Pharmacist [phone] Please check AMION for all [place] numbers 12/25/2020

## 2020-12-25 NOTE — Progress Notes (Signed)
[place] for Heparin (holding apixaban for procedure) Indication: atrial fibrillation  Allergies  Allergen Reactions  . Sulfa Antibiotics Itching    Patient Measurements: Height: 5\' 7"  (170.2 cm) Weight: 128.5 kg (283 lb 4.7 oz) IBW/kg (Calculated) : 61.6 Heparin Dosing Weight: 92.45 kg  Vital Signs: Temp: 98.4 F (36.9 C) (05/07 0341) Temp Source: Oral (05/07 0341) BP: 148/88 (05/07 0341) Pulse Rate: 105 (05/07 0341)  Labs: Recent Labs    12/22/20 1256 12/23/20 0450 12/23/20 0558 12/24/20 0445 12/24/20 0901 12/24/20 2026 12/25/20 0500  HGB 13.3  12.9  --   --   --   --   --  12.2  HCT 39.0  38.0  --   --   --   --   --  38.1  PLT  --   --   --   --   --   --  224  APTT  --   --   --   --  34 60* 91*  HEPARINUNFRC  --   --   --   --  >1.10*  --  >1.10*  CREATININE  --  2.00*  --  1.88*  --   --  1.95*  TROPONINIHS  --  6 6  --   --   --   --     Estimated Creatinine Clearance: 40.7 mL/min (A) (by C-G formula based on SCr of 1.95 mg/dL (H)).   Medical History: Past Medical History:  Diagnosis Date  . Allergic rhinitis   . Arthritis   . Asthma   . Chest pain 12/27/2017  . Chronic diastolic CHF (congestive heart failure) ([place])   . COPD (chronic obstructive pulmonary disease) ([place])   . Depression   . DM (diabetes mellitus) ([place])   . DVT (deep vein thrombosis) in pregnancy   . Dysrhythmia    atrial fibrilation  . GERD (gastroesophageal reflux disease)   . Headache(784.0)   . HTN (hypertension)   . Hyperlipidemia   . Obesity   . Pneumonia 04/2018   RIGHT LOBE  . Shortness of breath   . Sleep apnea    compliant with CPAP    Medications:  Facility-Administered Medications Prior to Admission  Medication Dose Route Frequency Provider Last Rate Last Admin  . [DISCONTINUED] nitroGLYCERIN (NITROSTAT) SL tablet 0.4 mg  0.4 mg Sublingual Q5 min PRN Buford Dresser, MD   0.4 mg at 11/22/20 1414   Medications  Prior to Admission  Medication Sig Dispense Refill Last Dose  . Accu-Chek Softclix Lancets lancets Use as instructed to check blood sugar three times daily. E11.69 100 each 12 Past Week at Unknown time  . albuterol (VENTOLIN HFA) 108 (90 Base) MCG/ACT inhaler Inhale 2 puffs into the lungs every 4 (four) hours as needed for wheezing or shortness of breath.    12/18/2020 at Unknown time  . Alcohol Swabs (B-D SINGLE USE SWABS REGULAR) PADS    Past Week at Unknown time  . amiodarone (PACERONE) 200 MG tablet Take 1 tablet (200 mg total) by mouth 2 (two) times daily. 60 tablet 6 Past Week at Unknown time  . apixaban (ELIQUIS) 5 MG TABS tablet Take 1 tablet (5 mg total) by mouth 2 (two) times daily. 180 tablet 1 Past Week at 6p  . aspirin-sod bicarb-citric acid (ALKA-SELTZER) 325 MG TBEF tablet Take 325 mg by mouth every 6 (six) hours as needed (indigestion).   Past Week at Unknown time  . Blood Glucose Monitoring  Suppl (ACCU-CHEK GUIDE ME) w/Device KIT Use as instructed to check blood sugar three times daily. E11.69 1 kit 0 12/19/2020 at Unknown time  . Blood Glucose Monitoring Suppl (ACCU-CHEK GUIDE) w/Device KIT USE AS INSTRUCTED TO CHECK BLOOD SUGAR THREE TIMES DAILY. E11.69   12/19/2020 at Unknown time  . digoxin (LANOXIN) 0.125 MG tablet Take 0.125 mg by mouth daily.    Past Week at Unknown time  . DULoxetine (CYMBALTA) 60 MG capsule Take 1 capsule (60 mg total) by mouth daily. 90 capsule 3 Past Week at Unknown time  . gabapentin (NEURONTIN) 300 MG capsule Take 2 capsules (600 mg total) by mouth 2 (two) times daily. 360 capsule 1 Past Week at Unknown time  . insulin aspart (NOVOLOG) 100 UNIT/ML injection Inject 12 Units into the skin 3 (three) times daily before meals. For blood sugars 0-199 give 0 units of insulin, 201-250 give 4 units, 251-300 give 6 units, 301-350 give 8 units, 351-400 give 10 units,> 400 give 12 units and call M.D. (Patient taking differently: Inject 5 Units into the skin 3 (three) times  daily before meals. For blood sugars 0-199 give 0 units of insulin, 201-250 give 4 units, 251-300 give 6 units, 301-350 give 8 units, 351-400 give 7.) 10 mL 3 Past Week at Unknown time  . LEVEMIR 100 UNIT/ML injection INJECT 0.4 MLS (40 UNITS TOTAL) INTO THE SKIN 2 (TWO) TIMES DAILY. (Patient taking differently: Inject 50 Units into the skin 2 (two) times daily.) 60 mL 1 Past Week at Unknown time  . Multiple Vitamins-Minerals (MULTIVITAMIN WOMEN PO) Take 1 tablet by mouth daily.    Past Week at Unknown time  . nitroGLYCERIN (NITROSTAT) 0.4 MG SL tablet Place 1 tablet (0.4 mg total) under the tongue every 5 (five) minutes x 3 doses as needed for chest pain. 25 tablet 12 12/19/2020 at Unknown time  . TRUE METRIX BLOOD GLUCOSE TEST test strip USE AS INSTRUCTED TO CHECK BLOOD SUGAR DAILY. 100 strip 11 Past Month at Unknown time  . TRULICITY 1.5 FY/1.0FB SOPN INJECT 1.5 MG INTO THE SKIN ONCE A WEEK. 0.5 mL 1 Past Week at Unknown time  . EPINEPHrine 0.3 mg/0.3 mL IJ SOAJ injection Inject 0.3 mg into the muscle as needed for anaphylaxis. (Patient taking differently: Inject 0.3 mg into the muscle as needed for anaphylaxis. Never used) 1 each 1 unk  . metoprolol succinate (TOPROL-XL) 100 MG 24 hr tablet Take 100 mg by mouth in the morning and at bedtime. Take with or immediately following a meal.   12/16/2020 at 6p  . Potassium Chloride ER 20 MEQ TBCR Take 20 mEq by mouth daily. 30 tablet 6 12/16/2020  . RESTASIS 0.05 % ophthalmic emulsion Place 1 drop into both eyes 2 (two) times daily.  12 12/16/2020  . rosuvastatin (CRESTOR) 10 MG tablet Take 1 tablet (10 mg total) by mouth daily. 90 tablet 1 12/16/2020  . torsemide 40 MG TABS Take 40 mg by mouth 2 (two) times daily. 60 tablet 11 12/16/2020    Assessment: CB y.o female presenting with CHF exacerbation. Pt has hx of Afib on Eliquis 5 mg BID, requires washout period for procedures scheduled next week.TEE with severe MR/TR now awaiting repair.  Aptt was below goal  tonight at 60s. No bleeding or IV issues noted.  5/7 AM update:  APTT therapeutic after rate increase  Goal of Therapy:  Heparin level 0.3-0.7 units/ml aPTT 66-102 seconds Monitor platelets by anticoagulation protocol: Yes   Plan:  Cont heparin  1550 units/hr 1200 aPTT F/u transition back to apixaban   Narda Bonds, PharmD, BCPS Clinical Pharmacist Phone: [phone]

## 2020-12-26 DIAGNOSIS — I509 Heart failure, unspecified: Secondary | ICD-10-CM | POA: Diagnosis not present

## 2020-12-26 DIAGNOSIS — I4821 Permanent atrial fibrillation: Secondary | ICD-10-CM | POA: Diagnosis not present

## 2020-12-26 DIAGNOSIS — I5023 Acute on chronic systolic (congestive) heart failure: Secondary | ICD-10-CM | POA: Diagnosis not present

## 2020-12-26 DIAGNOSIS — I340 Nonrheumatic mitral (valve) insufficiency: Secondary | ICD-10-CM | POA: Diagnosis not present

## 2020-12-26 DIAGNOSIS — I071 Rheumatic tricuspid insufficiency: Secondary | ICD-10-CM | POA: Diagnosis not present

## 2020-12-26 LAB — CBC WITH DIFFERENTIAL/PLATELET
Abs Immature Granulocytes: 0.02 10*3/uL (ref 0.00–0.07)
Basophils Absolute: 0.1 10*3/uL (ref 0.0–0.1)
Basophils Relative: 1 %
Eosinophils Absolute: 0.3 10*3/uL (ref 0.0–0.5)
Eosinophils Relative: 3 %
HCT: 39.8 % (ref 36.0–46.0)
Hemoglobin: 13.1 g/dL (ref 12.0–15.0)
Immature Granulocytes: 0 %
Lymphocytes Relative: 34 %
Lymphs Abs: 2.9 10*3/uL (ref 0.7–4.0)
MCH: 29 pg (ref 26.0–34.0)
MCHC: 32.9 g/dL (ref 30.0–36.0)
MCV: 88.1 fL (ref 80.0–100.0)
Monocytes Absolute: 1.1 10*3/uL — ABNORMAL HIGH (ref 0.1–1.0)
Monocytes Relative: 13 %
Neutro Abs: 4.3 10*3/uL (ref 1.7–7.7)
Neutrophils Relative %: 49 %
Platelets: 240 10*3/uL (ref 150–400)
RBC: 4.52 MIL/uL (ref 3.87–5.11)
RDW: 14.1 % (ref 11.5–15.5)
WBC: 8.6 10*3/uL (ref 4.0–10.5)
nRBC: 0 % (ref 0.0–0.2)

## 2020-12-26 LAB — HEPARIN LEVEL (UNFRACTIONATED): Heparin Unfractionated: >1.1 IU/mL — ABNORMAL HIGH (ref 0.30–0.70)

## 2020-12-26 LAB — BASIC METABOLIC PANEL
Anion gap: 9 (ref 5–15)
BUN: 27 mg/dL — ABNORMAL HIGH (ref 8–23)
CO2: 31 mmol/L (ref 22–32)
Calcium: 9.5 mg/dL (ref 8.9–10.3)
Chloride: 97 mmol/L — ABNORMAL LOW (ref 98–111)
Creatinine, Ser: 2.03 mg/dL — ABNORMAL HIGH (ref 0.44–1.00)
GFR, Estimated: 27 mL/min — ABNORMAL LOW (ref >60)
Glucose, Bld: 176 mg/dL — ABNORMAL HIGH (ref 70–99)
Potassium: 3.4 mmol/L — ABNORMAL LOW (ref 3.5–5.1)
Sodium: 137 mmol/L (ref 135–145)

## 2020-12-26 LAB — APTT: aPTT: 100 seconds — ABNORMAL HIGH (ref 24–36)

## 2020-12-26 LAB — GLUCOSE, CAPILLARY
Glucose-Capillary: 145 mg/dL — ABNORMAL HIGH (ref 70–99)
Glucose-Capillary: 215 mg/dL — ABNORMAL HIGH (ref 70–99)
Glucose-Capillary: 157 mg/dL — ABNORMAL HIGH (ref 70–99)
Glucose-Capillary: 213 mg/dL — ABNORMAL HIGH (ref 70–99)

## 2020-12-26 LAB — COOXEMETRY PANEL
Carboxyhemoglobin: 1.2 % (ref 0.5–1.5)
Methemoglobin: 0.9 % (ref 0.0–1.5)
O2 Saturation: 60.2 %
Total hemoglobin: 13 g/dL (ref 12.0–16.0)

## 2020-12-26 MED ORDER — INSULIN ASPART 100 UNIT/ML IJ SOLN
0.0000 [IU] | Freq: Three times a day (TID) | INTRAMUSCULAR | Status: DC
  Administered 2020-12-26: 3 [IU] via SUBCUTANEOUS
  Administered 2020-12-26: 5 [IU] via SUBCUTANEOUS
  Administered 2020-12-27: 11 [IU] via SUBCUTANEOUS
  Administered 2020-12-27 (×2): 3 [IU] via SUBCUTANEOUS
  Administered 2020-12-28: 8 [IU] via SUBCUTANEOUS
  Administered 2020-12-28: 5 [IU] via SUBCUTANEOUS
  Administered 2020-12-28: 3 [IU] via SUBCUTANEOUS
  Administered 2020-12-29: 15 [IU] via SUBCUTANEOUS

## 2020-12-26 MED ORDER — POTASSIUM CHLORIDE CRYS ER 20 MEQ PO TBCR
40.0000 meq | EXTENDED_RELEASE_TABLET | Freq: Once | ORAL | Status: AC
  Administered 2020-12-26: 40 meq via ORAL
  Filled 2020-12-26: qty 2

## 2020-12-26 NOTE — Progress Notes (Signed)
[place] for Heparin (holding apixaban for procedure) Indication: atrial fibrillation  Allergies  Allergen Reactions  . Sulfa Antibiotics Itching    Patient Measurements: Height: 5\' 7"  (170.2 cm) Weight: 126 kg (277 lb 12.8 oz) IBW/kg (Calculated) : 61.6 Heparin Dosing Weight: 92.45 kg  Vital Signs: Temp: 97.7 F (36.5 C) (05/08 0429) Temp Source: Oral (05/08 0429) BP: 107/81 (05/08 0429) Pulse Rate: 89 (05/07 2007)  Labs: Recent Labs    12/24/20 0445 12/24/20 0901 12/25/20 0500 12/25/20 1424 12/25/20 1543 12/25/20 2026 12/26/20 0142  HGB  --   --  12.2  --   --   --  13.1  HCT  --   --  38.1  --   --   --  39.8  PLT  --   --  224  --   --   --  240  APTT  --    < > 91*   < > 84* 88* 100*  HEPARINUNFRC  --    < > >1.10*  --   --  >1.10* >1.10*  CREATININE 1.88*  --  1.95*  --   --   --  2.03*   < > = values in this interval not displayed.    Estimated Creatinine Clearance: 38.6 mL/min (A) (by C-G formula based on SCr of 2.03 mg/dL (H)).   Medical History: Past Medical History:  Diagnosis Date  . Allergic rhinitis   . Arthritis   . Asthma   . Chest pain 12/27/2017  . Chronic diastolic CHF (congestive heart failure) ([place])   . COPD (chronic obstructive pulmonary disease) ([place])   . Depression   . DM (diabetes mellitus) ([place])   . DVT (deep vein thrombosis) in pregnancy   . Dysrhythmia    atrial fibrilation  . GERD (gastroesophageal reflux disease)   . Headache(784.0)   . HTN (hypertension)   . Hyperlipidemia   . Obesity   . Pneumonia 04/2018   RIGHT LOBE  . Shortness of breath   . Sleep apnea    compliant with CPAP    Assessment: CB 65 y.o female presenting with CHF exacerbation. Pt has hx of Afib on Eliquis 5 mg BID, requires washout period for procedures scheduled next week. TEE with severe MR/TR now awaiting repair planned for 5/11.  aPTT remains therapeutic but at higher end of goal and trending up on 1550  units/hr. Heparin level still not correlating. CBC WNL. No issues with bleeding or infusion.  Goal of Therapy:  Heparin level 0.3-0.7 units/ml aPTT 66-102 seconds Monitor platelets by anticoagulation protocol: Yes   Plan:  Heparin to 1500 units/hr Daily aPTT, heparin level, CBC  Romilda Garret, PharmD PGY1 Acute Care Pharmacy Resident 12/26/2020 7:08 AM  Please check [email or web address] for unit specific pharmacy phone numbers.

## 2020-12-26 NOTE — Progress Notes (Addendum)
Family Medicine Teaching Service Daily Progress Note Intern Pager: [phone]  Patient name: [name] Medical record number: [id number] Date of birth: 1956/05/13 Age: 65 y.o. Gender: female  Primary Care Provider: [name], NP Consultants: Cardiology, CVTS Code Status: FULL  Pt Overview and Major Events to Date:  5/1: Admitted 5/4: RHC  Assessment and Plan: [name] a 65 y.o.femalewho presented with CHF exacerbation, now planning for MV and TV replacement. PMH is significant forHFmrEF with EF 45-50%, atrial fibrillation on anticoagulation, GERD, HTN,and T2DM.  Combined systolic and diastolic CHF Exacerbation Severe Mitral and Tricuspid Regurgitation  Continues on Lasix IV 80mg  BID and milrinone gtt.  Cr trending up 1.88>1.95.>2.03  UOP 3.4L in last 24 hrs. Total -11.8L since admission. Weight down 8lbs since admission. CVTS planning for MVR/TVR this week.  On hep gtt for washout of Apixaban prior to surgery. -Cardiology following, appreciate recommendations             Continue Lasix 80 mg IV twice daily             Stopped Toprol-XL due to low CI             Cont hep gtt prior to surgery, holding Eliquis -CVTS following, appreciate recommendations             Plans for MR/TR repair next week -Add Spiro/SGLT-2 as able; likely not during this admission given creatinine continues to elevate -Fluid restriction 1200cc  Hypokalemia Recurrent in setting of aggressive diuresis.  K 3.4 again today. Mg 1.8. Goal K >4.  - replete K-Dur 13mEq x1 -BMP daily  Persistent Atrial Fibrillationon AC HR stable.  Severe MR/TR likely contributing. -Cardiology following, appreciate recommendations -Continue home medications: Digoxin, amiodarone -Cont Hep gtt per above, holding Eliquis  -Keep K>4, Mg>2  T2DM with Neuropathy: Chronic, stable Glucose on BMP 176.  Had elevated blood sugar of 315 last night when her insulin detemir was given.  Although  this is the highest she has had this admission, given that she is undergoing surgery this week will place back on CBG checks with mSSI. -CBG AC/qHS -mSSI -Continue insulin detemir 20U BID -Continue crestor 10 mg daily  -Continue Gabapentin 600 mg BID   HTN: Chronic, stable BP 123/81 this AM.  -Holding Toprol-XL 100 mg BID    HLD: chronic, stable -Continue crestor 10 mg daily   GERD: Chronic, stable -Can add back home medication of alka-seltzer as needed.  Depression: chronic, stable -Continue Duloxetine 60 mg daily   Constipation: Improving Had bowel movement yesterday. -Continue scheduled MiraLAX daily; can switch to PRN if needed -Continue daily senna: Can switch to PRN if needed  FEN/GI:Heart healthy/carb modified PPx: Heparin gtt, per pharmacy    Status is: Inpatient  Remains inpatient appropriate because:Ongoing diagnostic testing needed not appropriate for outpatient work up   Dispo: The patient is from: Home              Anticipated d/c is to: Home              Patient currently is not medically stable to d/c.   Difficult to place patient No   Subjective:  Patient feels well this morning.  States that she was able to have a large bowel movement yesterday and felt much relief with that.  Her breathing is improved.  She is awaiting surgery this week.  Objective: Temp:  [97.7 F (36.5 C)-98.3 F (36.8 C)] 97.7 F (36.5 C) (05/08 0429) Pulse Rate:  [89-102] 89 (  05/07 2007) Resp:  [18-20] 18 (05/08 0429) BP: (107-137)/(63-89) 107/81 (05/08 0429) SpO2:  [92 %-99 %] 99 % (05/08 0429) Weight:  [808 kg] 126 kg (05/08 0015) Physical Exam: General: Awake, alert, pleasant, in no distress, laying in bed, appears comfortable Cardiovascular: Irregularly irregular Respiratory: CTA B, without wheezing/rhonchi/rales Abdomen: Normoactive bowel sounds Extremities: Unna boots in place bilaterally, mild pitting edema to bilateral knees  Laboratory: Recent Labs   Lab 12/19/20 1016 12/22/20 1256 12/25/20 0500 12/26/20 0142  WBC 7.1  --  9.4 8.6  HGB 12.2 13.3  12.9 12.2 13.1  HCT 39.4 39.0  38.0 38.1 39.8  PLT 187  --  224 240   Recent Labs  Lab 12/19/20 1016 12/20/20 0743 12/24/20 0445 12/25/20 0500 12/26/20 0142  NA 141   < > 137 137 137  K 3.5   < > 3.4* 3.4* 3.4*  CL 102   < > 98 97* 97*  CO2 30   < > 31 32 31  BUN 16   < > 27* 30* 27*  CREATININE 1.68*   < > 1.88* 1.95* 2.03*  CALCIUM 9.8   < > 9.1 9.1 9.5  PROT 7.9  --   --   --   --   BILITOT 0.9  --   --   --   --   ALKPHOS 87  --   --   --   --   ALT 12  --   --   --   --   AST 19  --   --   --   --   GLUCOSE 164*   < > 115* 147* 176*   < > = values in this interval not displayed.    Imaging/Diagnostic Tests: No results found.   Sharion Settler, DO 12/26/2020, 8:32 AM PGY-1, [facility] Intern pager: [phone], text pages welcome

## 2020-12-26 NOTE — Plan of Care (Signed)
?  Problem: Education: ?Goal: Ability to demonstrate management of disease process will improve ?Outcome: Progressing ?  ?Problem: Education: ?Goal: Ability to verbalize understanding of medication therapies will improve ?Outcome: Progressing ?  ?Problem: Cardiac: ?Goal: Ability to achieve and maintain adequate cardiopulmonary perfusion will improve ?Outcome: Progressing ?  ?

## 2020-12-26 NOTE — Progress Notes (Addendum)
Progress Note  Patient Name: [name] Date of Encounter: 12/26/2020  [facility] Cardiologist: Buford Dresser, MD   Subjective   Respiratory status continues to improve. Some orthopnea but improving. No chest pain or palpitations. Recorded net output of -2.3 L yesterday.   Inpatient Medications    Scheduled Meds: . amiodarone  200 mg Oral BID  . Chlorhexidine Gluconate Cloth  6 each Topical Daily  . cycloSPORINE  1 drop Both Eyes BID  . digoxin  0.125 mg Oral Daily  . DULoxetine  60 mg Oral Daily  . furosemide  80 mg Intravenous BID  . gabapentin  600 mg Oral BID  . insulin aspart  0-15 Units Subcutaneous TID WC  . insulin detemir  20 Units Subcutaneous BID  . polyethylene glycol  17 g Oral Daily  . rosuvastatin  10 mg Oral Daily  . senna  1 tablet Oral Daily  . sodium chloride flush  10-40 mL Intracatheter Q12H  . sodium chloride flush  3 mL Intravenous Q12H  . sodium chloride flush  3 mL Intravenous Q12H   Continuous Infusions: . sodium chloride    . heparin 1,500 Units/hr (12/26/20 0829)  . milrinone 0.125 mcg/kg/min (12/26/20 0622)   PRN Meds: sodium chloride, acetaminophen, albuterol, hydrocortisone cream, menthol-cetylpyridinium, sodium chloride flush, sodium chloride flush   Vital Signs    Vitals:   12/26/20 0015 12/26/20 0429 12/26/20 0832 12/26/20 1011  BP: 135/67 107/81 123/81   Pulse:   90 (!) 108  Resp: 20 18 20    Temp: 98.3 F (36.8 C) 97.7 F (36.5 C) (!) 97.4 F (36.3 C)   TempSrc: Oral Oral Oral   SpO2: 98% 99% 98%   Weight: 126 kg     Height:        Intake/Output Summary (Last 24 hours) at 12/26/2020 1124 Last data filed at 12/26/2020 1013 Gross per 24 hour  Intake 1031.26 ml  Output 3400 ml  Net -2368.74 ml   Last 3 Weights 12/26/2020 12/25/2020 12/24/2020  Weight (lbs) 277 lb 12.8 oz 279 lb 1.6 oz 283 lb 4.7 oz  Weight (kg) 126.009 kg 126.6 kg 128.5 kg  Some encounter information is confidential and restricted. Go to  Review Flowsheets activity to see all data.      Telemetry    Afib, frequent RVR with rates in the 140's at times with minimal activity. In 80's to 90's at rest.  - Personally Reviewed  ECG    No new tracings.   Physical Exam   GEN: Appears in no acute distress.   Neck: No JVD Cardiac: Irregularly irregular. Systolic murmur along LLSB.  Respiratory: Clear to auscultation bilaterally without wheezing or rales. GI: Soft, nontender, non-distended  MS: No pitting edema; No deformity. Neuro:  Nonfocal  Psych: Normal affect   Labs    High Sensitivity Troponin:   Recent Labs  Lab 12/19/20 1016 12/19/20 1216 12/23/20 0450 12/23/20 0558  TROPONINIHS 8 12 6 6       Chemistry Recent Labs  Lab 12/24/20 0445 12/25/20 0500 12/26/20 0142  NA 137 137 137  K 3.4* 3.4* 3.4*  CL 98 97* 97*  CO2 31 32 31  GLUCOSE 115* 147* 176*  BUN 27* 30* 27*  CREATININE 1.88* 1.95* 2.03*  CALCIUM 9.1 9.1 9.5  GFRNONAA 29* 28* 27*  ANIONGAP 8 8 9      Hematology Recent Labs  Lab 12/22/20 1256 12/25/20 0500 12/26/20 0142  WBC  --  9.4 8.6  RBC  --  4.28 4.52  HGB 13.3  12.9 12.2 13.1  HCT 39.0  38.0 38.1 39.8  MCV  --  89.0 88.1  MCH  --  28.5 29.0  MCHC  --  32.0 32.9  RDW  --  14.2 14.1  PLT  --  224 240    BNPNo results for input(s): BNP, PROBNP in the last 168 hours.   DDimer No results for input(s): DDIMER in the last 168 hours.   Radiology     Cardiac Studies   Echocardiogram: 07/08/2020 IMPRESSIONS    1. Left ventricular ejection fraction, by estimation, is 45 to 50%. The  left ventricle has mildly decreased function. The left ventricle  demonstrates global hypokinesis. Left ventricular diastolic function could  not be evaluated.  2. Right ventricular systolic function was not well visualized. The right  ventricular size is moderately enlarged. There is normal pulmonary artery  systolic pressure.  3. Left atrial size was mildly dilated.  4. Right  atrial size was moderately dilated.  5. Eccentric MR jet; appears mild-moderate on parasternal views, but on  apical views (see images 52, 66, 72) extends into distal atrium and may  have Coanda effect. Incompletely visualized and PV not sampled, suspect  moderate to severe MR.. The mitral  valve is rheumatic. Moderate to severe mitral valve regurgitation.  6. Tricuspid valve regurgitation is severe.  7. The aortic valve is tricuspid. Aortic valve regurgitation is not  visualized. No aortic stenosis is present.  8. The inferior vena cava is normal in size with <50% respiratory  variability, suggesting right atrial pressure of 8 mmHg.   Comparison(s): No significant change from prior study.    RHC: 12/22/2020 1. Elevated R > L heart filling pressures with PAPi low but not markedly so (1.8) suggestive of a significant component of RV dysfunction.  2. Prominent v-waves in PCWP and RA pressure tracings suggestive of significant MR and TR.  3. Cardiac index low.    Patient Profile     65 y.o. female w/ PMH of chronic combined systolic and diastolic CHF (EF 81-85% by prior echo in 06/2020), persistent atrial fibrillation, severe MR, HTN, HLD and Type 2 DM who is currently admitted with an acute CHF exacerbation.   Assessment & Plan    1. Acute on Chronic Combined Systolic and Diastolic CHF - Presented with worsening dyspnea and lower extremity edema with BNP elevated to 2237 on admission. Prior echo in 06/2020 showed a reduced EF of 45-50% with global HK and moderate to severe MR. RHC this admission showed elevated R>L filling pressures and prominent v-waves in PCWP suggestive of significant MR and TR.  - She has been receiving IV Lasix 80mg  BID along with low-dose Milrinone 0.139mcg/kg/min and has responded well with a net output of -2.3L yesterday and -11.8 L thus far this admission. Continue current regimen for now and through discussion with Dr. Sallyanne Kuster, will continue Milrinone today  and likely d/c tomorrow.   2. Severe MR/Severe TR - Recent TEE in 11/2020 showed severe MR and severe TR. Evaluated by CT Surgery this admission with plans for MV repair and TV repair along with MAZE procedure this admission.  3. Persistent Atrial Fibrillation - Rate-control has been pursued as she failed Tikosyn in the past. Remains on Amiodarone 200mg  BID and Digoxin 0.125mg  daily. BP does not allow for the use of BB or CCB therapy (CCB not ideal given her mildly reduced EF). Plan for MAZE at time of surgery by review of CT Surgery  notes.  - On Eliquis prior to admission but currently held and being bridged with Heparin in anticipation of surgery this admission.   4. Stage 4 CKD - Baseline creatinine 1.6 - 1.7 but has been variable over the past year. Trending up slightly from 1.95 yesterday to 2.03 today. Repeat BMET in AM.    5. HLD - She has been continued on PTA Crestor 10mg  daily.   For questions or updates, please contact [place] Please consult [email or web address] for contact info under        Signed, Erma Heritage, PA-C  12/26/2020, 11:24 AM     I have seen and examined the patient along with Erma Heritage, PA-C , PA NP.  I have reviewed the chart, notes and new data.  I agree with PA/NP's note.  Key new complaints: feels better, lying almost flat. No GI complaints or other symptoms concerning for dig toxicity. Key examination changes: AFib - promptly has RVR to 140s with light activity, but 88-99 bpm at rest. Exam is severely limited by obesity, but appears to be approaching euvolemia Key new findings / data: creatinine is essentially unchanged, hovering around 2.0.  PLAN: Milrinone and IV furosemide through tomorrow, then may be able to switch to oral meds. Check dig level in AM. Surgery planned Wednesday.  Sanda Klein, MD, [place] [phone] 12/26/2020, 11:55 AM

## 2020-12-26 NOTE — Plan of Care (Signed)
  Problem: Education: Goal: Ability to demonstrate management of disease process will improve Outcome: Progressing   Problem: Cardiac: Goal: Ability to achieve and maintain adequate cardiopulmonary perfusion will improve Outcome: Progressing   Problem: Clinical Measurements: Goal: Ability to maintain clinical measurements within normal limits will improve Outcome: Progressing   Problem: Activity: Goal: Risk for activity intolerance will decrease Outcome: Progressing   Problem: Nutrition: Goal: Adequate nutrition will be maintained Outcome: Progressing   Problem: Coping: Goal: Level of anxiety will decrease Outcome: Progressing

## 2020-12-27 ENCOUNTER — Ambulatory Visit: Payer: Medicare Other | Admitting: Student

## 2020-12-27 ENCOUNTER — Inpatient Hospital Stay (HOSPITAL_COMMUNITY): Payer: Medicare Other

## 2020-12-27 DIAGNOSIS — I700 Atherosclerosis of aorta: Secondary | ICD-10-CM

## 2020-12-27 DIAGNOSIS — I340 Nonrheumatic mitral (valve) insufficiency: Secondary | ICD-10-CM | POA: Diagnosis not present

## 2020-12-27 DIAGNOSIS — I361 Nonrheumatic tricuspid (valve) insufficiency: Secondary | ICD-10-CM

## 2020-12-27 DIAGNOSIS — I5023 Acute on chronic systolic (congestive) heart failure: Secondary | ICD-10-CM | POA: Diagnosis not present

## 2020-12-27 DIAGNOSIS — I4821 Permanent atrial fibrillation: Secondary | ICD-10-CM | POA: Diagnosis not present

## 2020-12-27 DIAGNOSIS — E1159 Type 2 diabetes mellitus with other circulatory complications: Secondary | ICD-10-CM

## 2020-12-27 DIAGNOSIS — I152 Hypertension secondary to endocrine disorders: Secondary | ICD-10-CM

## 2020-12-27 LAB — COOXEMETRY PANEL
Carboxyhemoglobin: 1.3 % (ref 0.5–1.5)
Methemoglobin: 1 % (ref 0.0–1.5)
O2 Saturation: 63.6 %
Total hemoglobin: 13.5 g/dL (ref 12.0–16.0)

## 2020-12-27 LAB — BASIC METABOLIC PANEL
Anion gap: 9 (ref 5–15)
BUN: 30 mg/dL — ABNORMAL HIGH (ref 8–23)
CO2: 31 mmol/L (ref 22–32)
Calcium: 9.5 mg/dL (ref 8.9–10.3)
Chloride: 97 mmol/L — ABNORMAL LOW (ref 98–111)
Creatinine, Ser: 1.96 mg/dL — ABNORMAL HIGH (ref 0.44–1.00)
GFR, Estimated: 28 mL/min — ABNORMAL LOW (ref >60)
Glucose, Bld: 171 mg/dL — ABNORMAL HIGH (ref 70–99)
Potassium: 3.5 mmol/L (ref 3.5–5.1)
Sodium: 137 mmol/L (ref 135–145)

## 2020-12-27 LAB — GLUCOSE, CAPILLARY
Glucose-Capillary: 168 mg/dL — ABNORMAL HIGH (ref 70–99)
Glucose-Capillary: 303 mg/dL — ABNORMAL HIGH (ref 70–99)
Glucose-Capillary: 174 mg/dL — ABNORMAL HIGH (ref 70–99)
Glucose-Capillary: 224 mg/dL — ABNORMAL HIGH (ref 70–99)

## 2020-12-27 LAB — CBC
HCT: 40.8 % (ref 36.0–46.0)
Hemoglobin: 12.9 g/dL (ref 12.0–15.0)
MCH: 28.1 pg (ref 26.0–34.0)
MCHC: 31.6 g/dL (ref 30.0–36.0)
MCV: 88.9 fL (ref 80.0–100.0)
Platelets: 228 10*3/uL (ref 150–400)
RBC: 4.59 MIL/uL (ref 3.87–5.11)
RDW: 14.1 % (ref 11.5–15.5)
WBC: 9.8 10*3/uL (ref 4.0–10.5)
nRBC: 0 % (ref 0.0–0.2)

## 2020-12-27 LAB — HEPARIN LEVEL (UNFRACTIONATED): Heparin Unfractionated: 0.76 IU/mL — ABNORMAL HIGH (ref 0.30–0.70)

## 2020-12-27 LAB — APTT: aPTT: 85 seconds — ABNORMAL HIGH (ref 24–36)

## 2020-12-27 LAB — DIGOXIN LEVEL: Digoxin Level: 0.6 ng/mL — ABNORMAL LOW (ref 0.8–2.0)

## 2020-12-27 MED ORDER — ALUM & MAG HYDROXIDE-SIMETH 200-200-20 MG/5ML PO SUSP
30.0000 mL | ORAL | Status: DC | PRN
  Administered 2020-12-27: 30 mL via ORAL
  Filled 2020-12-27: qty 30

## 2020-12-27 MED ORDER — POTASSIUM CHLORIDE CRYS ER 20 MEQ PO TBCR
40.0000 meq | EXTENDED_RELEASE_TABLET | Freq: Once | ORAL | Status: AC
  Administered 2020-12-27: 40 meq via ORAL
  Filled 2020-12-27: qty 2

## 2020-12-27 NOTE — Progress Notes (Signed)
Family Medicine Teaching Service Daily Progress Note Intern Pager: [phone]  Patient name: [name] Medical record number: [id number] Date of birth: 10/08/1955 Age: 65 y.o. Gender: female  Primary Care Provider: [name], NP Consultants: Cardiology Code Status: FULL  Pt Overview and Major Events to Date:  5/1: Admitted 5/4: RHC  Assessment and Plan: Tari Lecount a 65 y.o.femalewho presented with CHF exacerbation, now planning for MV and TV replacement. PMH is significant forHFmrEF with EF 45-50%, atrial fibrillation on anticoagulation, GERD, HTN,and T2DM.  Combined systolic and diastolic CHF Exacerbation Severe Mitral and Tricuspid Regurgitation Crstable 2.03>1.96>2.04.UOP3.3L in last 24 hrs.Total -17.5L since admission. Weight down 9 lbs since admission (+5 lb weight gain since yesterday). Current weight 276, dry weight 270.CVTS planning for MVR/TV annuloplasty/MAZE procedure tomorrow, 5/11. Continues on heparin gtt for washout of Apixaban prior to surgery. -Cardiology following, appreciate recommendations Transition to PO Torsemide 40 mg BID             Continue Milrinone gtt.             Continue Dig 0.125 mg PO daily              Continue Amiodarone 200 mg BID daily  Stopped Toprol-XL due to low CI Cont hep gtt prior to surgery, holding Eliquis -CVTS following, appreciate recommendations To OR tomorrow -Add Spiro/SGLT-2 as able -Fluid restriction 1200cc  Hypokalemia: Resolved Recurrent in setting of aggressive diuresis. K 3.6.GoalK>4.  -Replete K-Dur 23mEq x1  -BMP daily  Persistent Atrial Fibrillationon AC HR 90's.Severe MR/TRmost likely contributing. -Cardiology following, appreciate recommendations -Continue home medications: Digoxin, amiodarone -Cont Hep gtt per above, holding Eliquis -Keep K>4, Mg>2  T2DM with Neuropathy: Chronic, stable CBG  168-303.Received 17U SSI yesterday. -CBG AC/qHS -mSSI -Continue insulin detemir 20U BID -Continue crestor 10 mg daily  -Continue Gabapentin 600 mg BID   HTN: Chronic, stable BP 127/87 this AM.  -Holding Toprol-XL 100 mg BID   HLD: chronic, stable -Continue crestor 10 mg daily   GERD: Chronic, stable -Can add back home medication of alka-seltzer as needed.  Depression: chronic, stable -Continue Duloxetine 60 mg daily    FEN/GI:Heart healthy/carb modified; NPO after midnight PPx: Heparin gtt, per pharmacy   Status is: Inpatient  Remains inpatient appropriate because:Ongoing diagnostic testing needed not appropriate for outpatient work up   Dispo: The patient is from: Home              Anticipated d/c is to: Home              Patient currently is not medically stable to d/c.   Difficult to place patient No   Subjective:  Patient feels well this morning.  She complains of slight frontal headache, feels symptoms her sinuses.  She has had similar symptoms in the past for which she takes Tylenol.  Denies chest pain, abdominal pain.  Feeling ready for surgery tomorrow.  Objective: Temp:  [97.9 F (36.6 C)-98 F (36.7 C)] 98 F (36.7 C) (05/09 0504) Pulse Rate:  [107] 107 (05/09 0504) Resp:  [16-20] 20 (05/09 0504) BP: (110-137)/(66-74) 137/74 (05/09 0504) SpO2:  [99 %-100 %] 100 % (05/09 0504) Weight:  [123.1 kg] 123.1 kg (05/09 0504) Physical Exam: General: Awake, alert, pleasant, no distress Cardiovascular: Irregularly irregular rate and rhythm Respiratory: CTA B Abdomen: Soft, nondistended, nontender in all quadrants Skin: hematomas visualized medial left arm and right arm Extremities: Unna boots in place bilaterally  Laboratory: Recent Labs  Lab 12/25/20 0500 12/26/20 0142 12/27/20 0435  WBC 9.4 8.6 9.8  HGB 12.2 13.1 12.9  HCT 38.1 39.8 40.8  PLT 224 240 228   Recent Labs  Lab 12/25/20 0500 12/26/20 0142 12/27/20 0435  NA 137 137 137   K 3.4* 3.4* 3.5  CL 97* 97* 97*  CO2 32 31 31  BUN 30* 27* 30*  CREATININE 1.95* 2.03* 1.96*  CALCIUM 9.1 9.5 9.5  GLUCOSE 147* 176* 171*    Imaging/Diagnostic Tests: None new.  Sharion Settler, DO 12/27/2020, 6:52 PM PGY-1, [place] Intern pager: [phone], text pages welcome

## 2020-12-27 NOTE — Progress Notes (Signed)
[place] for Heparin (holding apixaban for procedure) Indication: atrial fibrillation  Allergies  Allergen Reactions  . Sulfa Antibiotics Itching    Patient Measurements: Height: 5\' 7"  (170.2 cm) Weight: 123.1 kg (271 lb 4.8 oz) IBW/kg (Calculated) : 61.6 Heparin Dosing Weight: 92.45 kg  Vital Signs: Temp: 98 F (36.7 C) (05/09 0504) Temp Source: Oral (05/09 0504) BP: 137/74 (05/09 0504) Pulse Rate: 107 (05/09 0504)  Labs: Recent Labs    12/25/20 0500 12/25/20 1424 12/25/20 2026 12/26/20 0142 12/27/20 0435  HGB 12.2  --   --  13.1 12.9  HCT 38.1  --   --  39.8 40.8  PLT 224  --   --  240 228  APTT 91*   < > 88* 100* 85*  HEPARINUNFRC >1.10*  --  >1.10* >1.10* 0.76*  CREATININE 1.95*  --   --  2.03* 1.96*   < > = values in this interval not displayed.    Estimated Creatinine Clearance: 39.5 mL/min (A) (by C-G formula based on SCr of 1.96 mg/dL (H)).   Medical History: Past Medical History:  Diagnosis Date  . Allergic rhinitis   . Arthritis   . Asthma   . Chest pain 12/27/2017  . Chronic diastolic CHF (congestive heart failure) ([place])   . COPD (chronic obstructive pulmonary disease) ([place])   . Depression   . DM (diabetes mellitus) ([place])   . DVT (deep vein thrombosis) in pregnancy   . Dysrhythmia    atrial fibrilation  . GERD (gastroesophageal reflux disease)   . Headache(784.0)   . HTN (hypertension)   . Hyperlipidemia   . Obesity   . Pneumonia 04/2018   RIGHT LOBE  . Shortness of breath   . Sleep apnea    compliant with CPAP    Assessment: CB 65 y.o female presenting with CHF exacerbation. Pt has hx of Afib on Eliquis 5 mg BID at home, now held for surgery. TEE with severe MR/TR now awaiting repair planned for 5/11. Pharmacy consulted for heparin  aPTT remains therapeutic. Heparin level down trending but still not correlating. CBC stable.  Goal of Therapy:  Heparin level 0.3-0.7 units/ml aPTT 66-102  seconds Monitor platelets by anticoagulation protocol: Yes   Plan:  Continue Heparin to 1500 units/hr Daily aPTT, heparin level, CBC F/u aPTT until correlates with heparin level  Monitor for signs/symptoms of bleeding  F/u restart apixaban when no procedures   Benetta Spar, PharmD, BCPS, BCCP Clinical Pharmacist  Please check AMION for all [place] phone numbers After 10:00 PM, call [place]

## 2020-12-27 NOTE — Progress Notes (Signed)
Orthopedic Tech Progress Note Patient Details:  [name] Nov 23, 1955 [id number]  Ortho Devices Type of Ortho Device: Louretta Parma boot Ortho Device/Splint Location: BLE Ortho Device/Splint Interventions: Ordered,Application   Post Interventions Patient Tolerated: Well Instructions Provided: Care of [place] 12/27/2020, 1:57 PM

## 2020-12-27 NOTE — Progress Notes (Signed)
Family Medicine Teaching Service Daily Progress Note Intern Pager: [phone]  Patient name: Annebelle Bostic Medical record number: [id number] Date of birth: 24-Apr-1956 Age: 65 y.o. Gender: female  Primary Care Provider: [name], NP Consultants: Cardiology, CVTS Code Status: FULL  Pt Overview and Major Events to Date:  5/1: Admitted 5/4: RHC  Assessment and Plan: Audria Takeshita a 64 y.o.femalewho presented with CHF exacerbation, now planning for MV and TV replacement. PMH is significant forHFmrEF with EF 45-50%, atrial fibrillation on anticoagulation, GERD, HTN,and T2DM.  Combined systolic and diastolic CHF Exacerbation Severe Mitral and Tricuspid Regurgitation Continues on Lasix IV 80mg  BID and milrinone gtt. Cr stable 1.95.>2.03>1.96 UOP 3.6L in last 24 hrs. Total -14.9L since admission. Weight down 14.6lbs since admission. She is near dry weight at this point (271 lbs this AM, dry weight 270).CVTS planning for MVR/TVR in a couple days. On hep gtt for washout of Apixaban prior to surgery. -Cardiology following, appreciate recommendations Continue Lasix 80 mg IV twice daily  Possibly transition to p.o. torsemide tomorrow  Potential trial off milrinone gtt. tomorrow  Continue Dig 0.125 mg PO daily   Continue Amiodarone 200 mg BID daily  Stopped Toprol-XL due to low CI Cont hep gtt prior to surgery, holding Eliquis -CVTS following, appreciate recommendations Plans for MR/TR repair scheduled for 5/11 -Add Spiro/SGLT-2 as able; likely not during this admission given creatinine continues to elevate -Fluid restriction 1200cc  Hypokalemia: Resolved Recurrent in setting of aggressive diuresis. K 3.5 today. Goal K >4.  -Replete K-Dur 72mEq x1 -BMP daily  Persistent Atrial Fibrillationon AC HR ranging 70's-low 100's.Severe MR/TR most likely contributing. -Cardiology following, appreciate  recommendations -Continue home medications: Digoxin, amiodarone -Cont Hep gtt per above, holding Eliquis -Keep K>4, Mg>2  T2DM with Neuropathy: Chronic, stable CBG 157-215. Received 8U SSI yesterday. -CBG AC/qHS -mSSI -Continue insulin detemir 20U BID -Continue crestor 10 mg daily  -Continue Gabapentin 600 mg BID   HTN: Chronic, stable BP 137/74 this AM. Only one elevated BP in past 24 hours at 173/73, otherwise has been normotensive.  -Holding Toprol-XL 100 mg BID    HLD: chronic, stable -Continue crestor 10 mg daily   GERD: Chronic, stable -Can add back home medication of alka-seltzer as needed.  Depression: chronic, stable -Continue Duloxetine 60 mg daily   Constipation: Improving Had bowel movement yesterday. -Continuescheduled MiraLAX daily; can switch to PRN if needed -Continuedaily senna: Can switch to PRN if needed   FEN/GI:Heart healthy/carb modified PPx: Heparin gtt, per pharmacy    Status is: Inpatient  Remains inpatient appropriate because:Ongoing diagnostic testing needed not appropriate for outpatient work up   Dispo: The patient is from: Home              Anticipated d/c is to: Home              Patient currently is not medically stable to d/c.   Difficult to place patient No  Subjective:  Patient feels well this morning.  She has no complaints.  She is awaiting surgery on Wednesday.  She has been able to move her bowels but would like to keep scheduled medication on to keep her regular.  Objective: Temp:  [97.4 F (36.3 C)-98 F (36.7 C)] 98 F (36.7 C) (05/09 0504) Pulse Rate:  [78-108] 107 (05/09 0504) Resp:  [16-20] 20 (05/09 0504) BP: (110-173)/(66-81) 137/74 (05/09 0504) SpO2:  [94 %-100 %] 100 % (05/09 0504) Weight:  [123.1 kg] 123.1 kg (05/09 0504) Physical Exam: General: Obese patient, awake,  alert, pleasant, in no distress Cardiovascular: Irregularly irregular Respiratory: Faint crackles bilateral bases, good air  movement, no respiratory distress Abdomen: Obese abdomen Extremities: Unna boots in place bilaterally, mild pitting edema below b/l knees  Laboratory: Recent Labs  Lab 12/25/20 0500 12/26/20 0142 12/27/20 0435  WBC 9.4 8.6 9.8  HGB 12.2 13.1 12.9  HCT 38.1 39.8 40.8  PLT 224 240 228   Recent Labs  Lab 12/25/20 0500 12/26/20 0142 12/27/20 0435  NA 137 137 137  K 3.4* 3.4* 3.5  CL 97* 97* 97*  CO2 32 31 31  BUN 30* 27* 30*  CREATININE 1.95* 2.03* 1.96*  CALCIUM 9.1 9.5 9.5  GLUCOSE 147* 176* 171*    Imaging/Diagnostic Tests: No results found.   Sharion Settler, DO 12/27/2020, 5:58 AM PGY-1, [place] Intern pager: [phone], text pages welcome

## 2020-12-27 NOTE — Progress Notes (Signed)
[id number] Discussed with pt the importance of IS and walking after surgery. Gave IS and pt demonstrated 1000 ml correctly. Discussed staying in the tube and handout given. Gave OHS booklet, care guide, and wrote down how to view pre op video. Understanding voiced. Pt stated she will have assistance at home after discharge. Graylon Good RN BSN 12/27/2020 2:14 PM

## 2020-12-27 NOTE — Progress Notes (Signed)
5/9-Attempted to perform pulmonary function test with patient. She is gagging and unable to perform pulmonary function test. Sevaeral attempts made.

## 2020-12-27 NOTE — Progress Notes (Addendum)
Progress Note  Patient Name: [name] Date of Encounter: 12/27/2020  Primary Cardiologist: Buford Dresser, MD   Subjective   Patient notes that she is doing better.  Since day prior notes improvement in breathing but not back to baseline.    No chest pain or pressure.  Still has SOB.  Still losing weight with diuresis.  Notes palpitations (symptomatic AF).  Has had palpitations most of the year per patient.  Inpatient Medications    Scheduled Meds: . amiodarone  200 mg Oral BID  . Chlorhexidine Gluconate Cloth  6 each Topical Daily  . cycloSPORINE  1 drop Both Eyes BID  . digoxin  0.125 mg Oral Daily  . DULoxetine  60 mg Oral Daily  . furosemide  80 mg Intravenous BID  . gabapentin  600 mg Oral BID  . insulin aspart  0-15 Units Subcutaneous TID WC  . insulin detemir  20 Units Subcutaneous BID  . polyethylene glycol  17 g Oral Daily  . potassium chloride  40 mEq Oral Once  . rosuvastatin  10 mg Oral Daily  . senna  1 tablet Oral Daily  . sodium chloride flush  10-40 mL Intracatheter Q12H  . sodium chloride flush  3 mL Intravenous Q12H  . sodium chloride flush  3 mL Intravenous Q12H   Continuous Infusions: . sodium chloride    . heparin 1,500 Units/hr (12/26/20 1600)  . milrinone 0.125 mcg/kg/min (12/26/20 1600)   PRN Meds: sodium chloride, acetaminophen, albuterol, hydrocortisone cream, menthol-cetylpyridinium, sodium chloride flush, sodium chloride flush   Vital Signs    Vitals:   12/26/20 1011 12/26/20 1502 12/26/20 2120 12/27/20 0504  BP:  (!) 173/73 110/66 137/74  Pulse: (!) 108 78 (!) 107 (!) 107  Resp:  20 16 20   Temp:  97.7 F (36.5 C) 97.9 F (36.6 C) 98 F (36.7 C)  TempSrc:  Oral Oral Oral  SpO2:  94% 99% 100%  Weight:    123.1 kg  Height:        Intake/Output Summary (Last 24 hours) at 12/27/2020 0759 Last data filed at 12/27/2020 0508 Gross per 24 hour  Intake 473.7 ml  Output 3600 ml  Net -3126.3 ml   Filed Weights    12/25/20 0627 12/26/20 0015 12/27/20 0504  Weight: 126.6 kg 126 kg 123.1 kg    Telemetry    AF RVR mean rate 105, occasional PVC - Personally Reviewed  ECG    12/23/20:  AF rate 73 anterior and inferior infarct pattern - Personally Reviewed  Physical Exam   GEN: No acute distress.  Obese female Neck: No JVD but thick neck difficult exam Cardiac: RRR, no rubs, or gallops.  Distant heart sounds, I/VI holosystolic murmur Respiratory: Crackles in bases, good air movement GI: Soft, nontender, non-distended  MS: No edema; No deformity.  Neuro:  Nonfocal  Psych: Normal affect   Labs    Chemistry Recent Labs  Lab 12/25/20 0500 12/26/20 0142 12/27/20 0435  NA 137 137 137  K 3.4* 3.4* 3.5  CL 97* 97* 97*  CO2 32 31 31  GLUCOSE 147* 176* 171*  BUN 30* 27* 30*  CREATININE 1.95* 2.03* 1.96*  CALCIUM 9.1 9.5 9.5  GFRNONAA 28* 27* 28*  ANIONGAP 8 9 9      Hematology Recent Labs  Lab 12/25/20 0500 12/26/20 0142 12/27/20 0435  WBC 9.4 8.6 9.8  RBC 4.28 4.52 4.59  HGB 12.2 13.1 12.9  HCT 38.1 39.8 40.8  MCV 89.0 88.1 88.9  MCH 28.5 29.0 28.1  MCHC 32.0 32.9 31.6  RDW 14.2 14.1 14.1  PLT 224 240 228    Cardiac EnzymesNo results for input(s): TROPONINI in the last 168 hours. No results for input(s): TROPIPOC in the last 168 hours.   BNPNo results for input(s): BNP, PROBNP in the last 168 hours.   DDimer No results for input(s): DDIMER in the last 168 hours.   Radiology    No results found.  Cardiac Studies   Transthoracic Echocardiogram: Date: 07/08/20 Results: Eccentric MR; at least moderate 1. Left ventricular ejection fraction, by estimation, is 45 to 50%. The  left ventricle has mildly decreased function. The left ventricle  demonstrates global hypokinesis. Left ventricular diastolic function could  not be evaluated.  2. Right ventricular systolic function was not well visualized. The right  ventricular size is moderately enlarged. There is normal  pulmonary artery  systolic pressure.  3. Left atrial size was mildly dilated.  4. Right atrial size was moderately dilated.  5. Eccentric MR jet; appears mild-moderate on parasternal views, but on  apical views (see images 52, 66, 72) extends into distal atrium and may  have Coanda effect. Incompletely visualized and PV not sampled, suspect  moderate to severe MR.. The mitral  valve is rheumatic. Moderate to severe mitral valve regurgitation.  6. Tricuspid valve regurgitation is severe.  7. The aortic valve is tricuspid. Aortic valve regurgitation is not  visualized. No aortic stenosis is present.  8. The inferior vena cava is normal in size with <50% respiratory  variability, suggesting right atrial pressure of 8 mmHg.   Transesophageal Echocardiogram: Date: 11/26/20 Results: Severe MR and TR 1. Left ventricular ejection fraction, by estimation, is 45 to 50%. The  left ventricle has mildly decreased function. The left ventricle  demonstrates global hypokinesis.  2. Right ventricular systolic function is moderately reduced. The right  ventricular size is moderately enlarged.  3. Left atrial size was severely dilated. No left atrial/left atrial  appendage thrombus was detected.  4. Right atrial size was severely dilated.  5. MR more appreciable early in the study when blood pressure was normal.  MR jet appears less later in the study, likely due to reduction in blood  pressure. Posterior leaflet appears mildly restricted, with override of  anterior leaflet. Systolic flow  reversal seen in right sided pulmonary veins.. The mitral valve is  abnormal. Severe mitral valve regurgitation. The mean mitral valve  gradient is 2.0 mmHg.  6. Tricuspid valve regurgitation is severe.  7. The aortic valve is tricuspid. Aortic valve regurgitation is not  visualized. No aortic stenosis is present.   NonCardiac CT : Date: 01/15/20 Results: Aortic Atherosclerosis mPA dilation 33  mm CS dilation 15 mm  Left/Right Heart Catheterizations: Date: 12/22/20 Results: 1. Elevated R > L heart filling pressures with PAPi low but not markedly so (1.8) suggestive of a significant component of RV dysfunction.  2. Prominent v-waves in PCWP and RA pressure tracings suggestive of significant MR and TR.  3. Cardiac index low.   Date: 12/31/2017 Results: minimal non-obstructive CAD  Prox LAD lesion is 10% stenosed.  Dist LM to Ost LAD lesion is 10% stenosed.  Ost Cx lesion is 15% stenosed.  The left ventricular systolic function is normal.  LV end diastolic pressure is normal.    Patient Profile     65 y.o. female with a history of morbid obesity, Severe TR, severe MR, HFmrEF, Morbid Obesity DM/HTN/HLd, and Longstanding persistent AF who presented with  HF Decompensation  Assessment & Plan    Severe TR Severe MR- surgery planned for 12/29/20 Heart Failure mildly reduced Ejection Fraction  - NYHA class III, Stage D, hypervolemic, etiology from AF likely - Diuretic regimen: Lasix 80 mg IV BID -> PO with goal of transition to PO (home dose torsemide 40 mg PO BID) possible 12/28/20 - BB not added in the setting of hypotension with inodilation and stage D HF - ARNI/ARB/ACEi/MRA/SGLT2i have been not started in the setting of CKD IV - potential 12/28/20 trial off milrinone with follow up venous co-ox Longstanding Persistent Atrial Fibrillation - CHADSVASC 4+ (vascular disease not included in her assessment) on AC - continue dig 0.125 mg PO daily Dig level 0.6 - 3.6 g of amiodarone from 5/1->5/9; continue 200 mg PO BID - Consideration of concurrent MAZE/LAA excision per CT Surgery:  - Presently planned for MR Repair or replacement, TV annuloplasty, and MAZE  Morbid Obesity/DM/HTN DM with HLD Aortic Atherosclerosis - continue rosuvastatin 10 mg PO Daily; LDL last < 70 in 2019  Discussed with patient and family medicine colleagues  For questions or updates, please contact [facility] Please consult [email or web address] for contact info under Cardiology/STEMI.      Signed, Werner Lean, MD  12/27/2020, 7:59 AM

## 2020-12-27 NOTE — Progress Notes (Signed)
Physical Therapy Treatment Patient Details Name: [name] MRN: [id number] DOB: October 30, 1955 Today's Date: 12/27/2020    History of Present Illness Patient is a 65 y/o female who presents on 12/19/20 with SOB, chest pain, N/V and emesis. Admitted with CHF exacerbation. Plan for surgery on Wednesday, 12/29/20. PMH includes HTN. DM2, A-fib, CHF, OSA, DVT, COPD.    PT Comments    Patient progressing well towards PT goals. Reports feeling more SOB today with activity however also just recently finished bathing self prior to PT arrival. Tolerated gait training with use of rollator for support, needing 2 seated rest breaks due to 2-3/4 DOE. HR ranging from 115-169 bpm max with activity. Needs constant cues to safely lock/unlock brakes prior to sitting/when standing to resume activity.  Plan for surgery on Wednesday. Will follow and progress as able. Will need to address stairs at some point but not appropriate today.    Follow Up Recommendations  Home health PT;Supervision - Intermittent     Equipment Recommendations  None recommended by PT    Recommendations for Other Services       Precautions / Restrictions Precautions Precautions: Other (comment);Fall Precaution Comments: watch HR, hx of falls Restrictions Weight Bearing Restrictions: No    Mobility  Bed Mobility               General bed mobility comments: Sitting EOB upon PT arrival.    Transfers Overall transfer level: Needs assistance Equipment used: None Transfers: Sit to/from Stand Sit to Stand: Min guard         General transfer comment: Min guard for safety, reaching for counter for support initially, transferred to chair post ambulation. Stood from rollator seat x2 needing cues to lock brakes prior to sitting for safety.  Ambulation/Gait Ambulation/Gait assistance: Supervision Gait Distance (Feet): 150 Feet (+50' + 200') Assistive device: 4-wheeled walker Gait Pattern/deviations: Step-through  pattern;Decreased stride length;Wide base of support Gait velocity: decreased   General Gait Details: Slow, mostly steady waddling like gait with rollator for support; 2-3/4 DOE. 2 seated rest breaks. HR ranging from 115-169 bpm max.   Stairs             Wheelchair Mobility    Modified Rankin (Stroke Patients Only)       Balance Overall balance assessment: History of Falls;Needs assistance Sitting-balance support: Feet supported;No upper extremity supported Sitting balance-Leahy Scale: Good     Standing balance support: During functional activity Standing balance-Leahy Scale: Fair Standing balance comment: Pt able to stand without UE to donn mask.                            Cognition Arousal/Alertness: Awake/alert Behavior During Therapy: WFL for tasks assessed/performed Overall Cognitive Status: Within Functional Limits for tasks assessed                                        Exercises      General Comments General comments (skin integrity, edema, etc.): HR ranging from 115-169 bpm max with activity.      Pertinent Vitals/Pain Pain Assessment: Faces Faces Pain Scale: Hurts a little bit Pain Location: chronic-back Pain Descriptors / Indicators: Discomfort;Aching Pain Intervention(s): Monitored during session;Repositioned    Home Living                      Prior Function  PT Goals (current goals can now be found in the care plan section) Progress towards PT goals: Progressing toward goals    Frequency    Min 3X/week      PT Plan Current plan remains appropriate    Co-evaluation              AM-PAC PT "6 Clicks" Mobility   Outcome Measure  Help needed turning from your back to your side while in a flat bed without using bedrails?: None Help needed moving from lying on your back to sitting on the side of a flat bed without using bedrails?: None Help needed moving to and from a bed to a  chair (including a wheelchair)?: A Little Help needed standing up from a chair using your arms (e.g., wheelchair or bedside chair)?: A Little Help needed to walk in hospital room?: A Little Help needed climbing 3-5 steps with a railing? : A Lot 6 Click Score: 19    End of Session   Activity Tolerance: Patient tolerated treatment well;Patient limited by fatigue (tachycardia) Patient left: in chair;with call bell/phone within reach Nurse Communication: Mobility status PT Visit Diagnosis: Pain;Muscle weakness (generalized) (M62.81);Unsteadiness on feet (R26.81);Difficulty in walking, not elsewhere classified (R26.2) Pain - part of body:  (back)     Time: 4235-3614 PT Time Calculation (min) (ACUTE ONLY): 26 min  Charges:  $Gait Training: 23-37 mins                     Marisa Severin, PT, DPT Acute Rehabilitation Services Pager [phone] Office [place] 12/27/2020, 11:42 AM

## 2020-12-28 ENCOUNTER — Inpatient Hospital Stay (HOSPITAL_COMMUNITY): Payer: Medicare Other

## 2020-12-28 ENCOUNTER — Telehealth (INDEPENDENT_AMBULATORY_CARE_PROVIDER_SITE_OTHER): Payer: Self-pay

## 2020-12-28 ENCOUNTER — Encounter (HOSPITAL_COMMUNITY): Payer: Self-pay | Admitting: Family Medicine

## 2020-12-28 DIAGNOSIS — I4821 Permanent atrial fibrillation: Secondary | ICD-10-CM | POA: Diagnosis not present

## 2020-12-28 DIAGNOSIS — K08109 Complete loss of teeth, unspecified cause, unspecified class: Secondary | ICD-10-CM

## 2020-12-28 DIAGNOSIS — I5023 Acute on chronic systolic (congestive) heart failure: Secondary | ICD-10-CM | POA: Diagnosis not present

## 2020-12-28 DIAGNOSIS — I340 Nonrheumatic mitral (valve) insufficiency: Secondary | ICD-10-CM | POA: Diagnosis not present

## 2020-12-28 DIAGNOSIS — K0510 Chronic gingivitis, plaque induced: Secondary | ICD-10-CM

## 2020-12-28 DIAGNOSIS — Z01818 Encounter for other preprocedural examination: Secondary | ICD-10-CM

## 2020-12-28 DIAGNOSIS — I509 Heart failure, unspecified: Secondary | ICD-10-CM | POA: Diagnosis not present

## 2020-12-28 DIAGNOSIS — K036 Deposits [accretions] on teeth: Secondary | ICD-10-CM

## 2020-12-28 LAB — GLUCOSE, CAPILLARY
Glucose-Capillary: 173 mg/dL — ABNORMAL HIGH (ref 70–99)
Glucose-Capillary: 269 mg/dL — ABNORMAL HIGH (ref 70–99)
Glucose-Capillary: 238 mg/dL — ABNORMAL HIGH (ref 70–99)
Glucose-Capillary: 215 mg/dL — ABNORMAL HIGH (ref 70–99)

## 2020-12-28 LAB — CBC
HCT: 40.3 % (ref 36.0–46.0)
Hemoglobin: 12.6 g/dL (ref 12.0–15.0)
MCH: 27.9 pg (ref 26.0–34.0)
MCHC: 31.3 g/dL (ref 30.0–36.0)
MCV: 89.2 fL (ref 80.0–100.0)
Platelets: 230 10*3/uL (ref 150–400)
RBC: 4.52 MIL/uL (ref 3.87–5.11)
RDW: 14 % (ref 11.5–15.5)
WBC: 9.5 10*3/uL (ref 4.0–10.5)
nRBC: 0 % (ref 0.0–0.2)

## 2020-12-28 LAB — URINALYSIS, ROUTINE W REFLEX MICROSCOPIC
Bacteria, UA: NONE SEEN
Bilirubin Urine: NEGATIVE
Glucose, UA: NEGATIVE mg/dL
Ketones, ur: NEGATIVE mg/dL
Leukocytes,Ua: NEGATIVE
Nitrite: NEGATIVE
Protein, ur: 30 mg/dL — AB
Specific Gravity, Urine: 1.009 (ref 1.005–1.030)
pH: 5 (ref 5.0–8.0)

## 2020-12-28 LAB — BASIC METABOLIC PANEL
Anion gap: 8 (ref 5–15)
BUN: 33 mg/dL — ABNORMAL HIGH (ref 8–23)
CO2: 29 mmol/L (ref 22–32)
Calcium: 9.5 mg/dL (ref 8.9–10.3)
Chloride: 97 mmol/L — ABNORMAL LOW (ref 98–111)
Creatinine, Ser: 2.04 mg/dL — ABNORMAL HIGH (ref 0.44–1.00)
GFR, Estimated: 27 mL/min — ABNORMAL LOW (ref >60)
Glucose, Bld: 162 mg/dL — ABNORMAL HIGH (ref 70–99)
Potassium: 3.6 mmol/L (ref 3.5–5.1)
Sodium: 134 mmol/L — ABNORMAL LOW (ref 135–145)

## 2020-12-28 LAB — SURGICAL PCR SCREEN
MRSA, PCR: NEGATIVE
Staphylococcus aureus: NEGATIVE

## 2020-12-28 LAB — BLOOD GAS, ARTERIAL
Acid-Base Excess: 2.8 mmol/L — ABNORMAL HIGH (ref 0.0–2.0)
Bicarbonate: 27.2 mmol/L (ref 20.0–28.0)
Drawn by: 51185
FIO2: 21
O2 Saturation: 94.5 %
Patient temperature: 37
pCO2 arterial: 45.4 mmHg (ref 32.0–48.0)
pH, Arterial: 7.395 (ref 7.350–7.450)
pO2, Arterial: 76.8 mmHg — ABNORMAL LOW (ref 83.0–108.0)

## 2020-12-28 LAB — COOXEMETRY PANEL
Carboxyhemoglobin: 1.1 % (ref 0.5–1.5)
Methemoglobin: 1 % (ref 0.0–1.5)
O2 Saturation: 62.7 %
Total hemoglobin: 13.3 g/dL (ref 12.0–16.0)

## 2020-12-28 LAB — TYPE AND SCREEN
ABO/RH(D): A POS
Antibody Screen: NEGATIVE

## 2020-12-28 LAB — PROTIME-INR
INR: 1.6 — ABNORMAL HIGH (ref 0.8–1.2)
Prothrombin Time: 19.3 seconds — ABNORMAL HIGH (ref 11.4–15.2)

## 2020-12-28 LAB — DIGOXIN LEVEL: Digoxin Level: 1 ng/mL (ref 0.8–2.0)

## 2020-12-28 LAB — HEPARIN LEVEL (UNFRACTIONATED): Heparin Unfractionated: 0.59 IU/mL (ref 0.30–0.70)

## 2020-12-28 LAB — APTT: aPTT: 78 seconds — ABNORMAL HIGH (ref 24–36)

## 2020-12-28 MED ORDER — TORSEMIDE 20 MG PO TABS
40.0000 mg | ORAL_TABLET | Freq: Two times a day (BID) | ORAL | Status: DC
  Administered 2020-12-28: 40 mg via ORAL
  Filled 2020-12-28: qty 2

## 2020-12-28 MED ORDER — MAGNESIUM SULFATE 50 % IJ SOLN
40.0000 meq | INTRAMUSCULAR | Status: DC
  Filled 2020-12-28: qty 9.85

## 2020-12-28 MED ORDER — SODIUM CHLORIDE 0.9 % IV SOLN
INTRAVENOUS | Status: DC
  Filled 2020-12-28: qty 30

## 2020-12-28 MED ORDER — INSULIN REGULAR(HUMAN) IN NACL 100-0.9 UT/100ML-% IV SOLN
INTRAVENOUS | Status: AC
  Administered 2020-12-29: 4.4 [IU]/h via INTRAVENOUS
  Filled 2020-12-28: qty 100

## 2020-12-28 MED ORDER — CEFAZOLIN SODIUM-DEXTROSE 2-4 GM/100ML-% IV SOLN
2.0000 g | INTRAVENOUS | Status: DC
  Filled 2020-12-28: qty 100

## 2020-12-28 MED ORDER — DEXMEDETOMIDINE HCL IN NACL 400 MCG/100ML IV SOLN
0.1000 ug/kg/h | INTRAVENOUS | Status: AC
  Administered 2020-12-29 (×2): .7 ug/kg/h via INTRAVENOUS
  Filled 2020-12-28: qty 100

## 2020-12-28 MED ORDER — DIAZEPAM 2 MG PO TABS
2.0000 mg | ORAL_TABLET | Freq: Once | ORAL | Status: AC
  Administered 2020-12-29: 2 mg via ORAL
  Filled 2020-12-28: qty 1

## 2020-12-28 MED ORDER — EPINEPHRINE HCL 5 MG/250ML IV SOLN IN NS
0.0000 ug/min | INTRAVENOUS | Status: DC
  Filled 2020-12-28: qty 250

## 2020-12-28 MED ORDER — TRANEXAMIC ACID 1000 MG/10ML IV SOLN
1.5000 mg/kg/h | INTRAVENOUS | Status: AC
  Administered 2020-12-29: 1.5 mg/kg/h via INTRAVENOUS
  Filled 2020-12-28: qty 25

## 2020-12-28 MED ORDER — METOPROLOL TARTRATE 12.5 MG HALF TABLET
12.5000 mg | ORAL_TABLET | Freq: Once | ORAL | Status: AC
  Administered 2020-12-29: 12.5 mg via ORAL
  Filled 2020-12-28: qty 1

## 2020-12-28 MED ORDER — NITROGLYCERIN IN D5W 200-5 MCG/ML-% IV SOLN
2.0000 ug/min | INTRAVENOUS | Status: DC
  Filled 2020-12-28: qty 250

## 2020-12-28 MED ORDER — TEMAZEPAM 15 MG PO CAPS: 15.0000 mg | ORAL_CAPSULE | Freq: Once | ORAL | Status: DC | PRN

## 2020-12-28 MED ORDER — CHLORHEXIDINE GLUCONATE CLOTH 2 % EX PADS
6.0000 | MEDICATED_PAD | Freq: Once | CUTANEOUS | Status: AC
  Administered 2020-12-29: 6 via TOPICAL

## 2020-12-28 MED ORDER — POTASSIUM CHLORIDE 2 MEQ/ML IV SOLN
80.0000 meq | INTRAVENOUS | Status: DC
  Filled 2020-12-28: qty 40

## 2020-12-28 MED ORDER — VANCOMYCIN HCL 1500 MG/300ML IV SOLN
1500.0000 mg | INTRAVENOUS | Status: AC
  Administered 2020-12-29: 1500 mg via INTRAVENOUS
  Filled 2020-12-28: qty 300

## 2020-12-28 MED ORDER — POTASSIUM CHLORIDE CRYS ER 20 MEQ PO TBCR
40.0000 meq | EXTENDED_RELEASE_TABLET | Freq: Once | ORAL | Status: AC
  Administered 2020-12-28: 40 meq via ORAL
  Filled 2020-12-28: qty 2

## 2020-12-28 MED ORDER — PHENYLEPHRINE HCL-NACL 20-0.9 MG/250ML-% IV SOLN
30.0000 ug/min | INTRAVENOUS | Status: AC
  Administered 2020-12-29: 40 ug/min via INTRAVENOUS
  Filled 2020-12-28: qty 250

## 2020-12-28 MED ORDER — TRANEXAMIC ACID (OHS) BOLUS VIA INFUSION
15.0000 mg/kg | INTRAVENOUS | Status: AC
  Administered 2020-12-29: 1881 mg via INTRAVENOUS
  Filled 2020-12-28: qty 1881

## 2020-12-28 MED ORDER — TRANEXAMIC ACID (OHS) PUMP PRIME SOLUTION
2.0000 mg/kg | INTRAVENOUS | Status: DC
  Filled 2020-12-28: qty 2.51

## 2020-12-28 MED ORDER — CHLORHEXIDINE GLUCONATE 0.12 % MT SOLN
15.0000 mL | Freq: Once | OROMUCOSAL | Status: AC
Start: 2020-12-29 — End: 2020-12-29
  Administered 2020-12-29: 15 mL via OROMUCOSAL
  Filled 2020-12-28: qty 15

## 2020-12-28 MED ORDER — NOREPINEPHRINE 4 MG/250ML-% IV SOLN
0.0000 ug/min | INTRAVENOUS | Status: AC
  Administered 2020-12-29: 2 ug/min via INTRAVENOUS
  Filled 2020-12-28: qty 250

## 2020-12-28 MED ORDER — PLASMA-LYTE 148 IV SOLN
INTRAVENOUS | Status: DC
  Filled 2020-12-28: qty 2.5

## 2020-12-28 MED ORDER — CEFAZOLIN IN SODIUM CHLORIDE 3-0.9 GM/100ML-% IV SOLN
3.0000 g | INTRAVENOUS | Status: DC
  Filled 2020-12-28: qty 100

## 2020-12-28 MED ORDER — MILRINONE LACTATE IN DEXTROSE 20-5 MG/100ML-% IV SOLN
0.3000 ug/kg/min | INTRAVENOUS | Status: AC
  Administered 2020-12-29: .125 ug/kg/min via INTRAVENOUS
  Filled 2020-12-28 (×2): qty 100

## 2020-12-28 NOTE — Progress Notes (Signed)
Heart Failure Stewardship Pharmacist Progress Note   PCP: [name], NP PCP-Cardiologist: Buford Dresser, MD    HPI:  65 yo female with a hx of chronic diastolic and systolic HF, difficult-to-control atrial fibrillation on Eliquis, HTN, CAD with nonobstructive CAD on LHC in 2019, and T2DM. She presented to the ED on 5/1 with SOB and L-sided CP found to have acute CHF exacerbation. ECHO last done on 11/26/20 revealed LVEF 45-50%, RV function mildly reduced, severe MR and TR. RHC on 5/4 revealed low CI (1.9) and elevated filling pressures (R>L) with low PAPi (1.8). PICC placed on 5/4 and started on milrinone 0.125 mcg/kg/min. Plans noted for MVR, TVr, and MAZE tomorrow (5/11).  Current HF Medications: Torsemide 40 mg BID Milrinone 0.125 mcg/kg/min Digoxin 0.125 mg daily  Prior to admission HF Medications: Torsemide 40 mg BID Metoprolol succinate 100 mg BID Digoxin 0.125 mg daily  Pertinent Lab Values: . Serum creatinine 2.04, BUN 33, Potassium 3.6, Sodium 134, Digoxin 1.0  Vital Signs: . Weight: 276 lbs (admission weight: 285 lbs) . Blood pressure: 120/80s  . Heart rate: 100s  Medication Assistance / Insurance Benefits Check: Does the patient have prescription insurance?  Yes Type of insurance plan: U[employer] Medicare and [place] Medicaid  Outpatient Pharmacy:  Prior to admission outpatient pharmacy: [facility] Is the patient willing to use [facility] at discharge? Yes Is the patient willing to transition their outpatient pharmacy to utilize a [facility] outpatient pharmacy?   Pending    Assessment: 1. Acute on chronic systolic CHF (EF 33-00%), due to NICM. NYHA class II-III symptoms. - Low cardiac index on RHC (1.9), Coox 63% this morning - on IV milrinone 0.125 mcg/kg/min. - Volume status improved on MD exam - agree with switching to torsemide 40 mg BID - Off metoprolol given low cardiac index  - Digoxin trough level supratherapeutic at 1.0 this AM - recommend holding or  decreasing digoxin to 0.0625 mg daily - Can consider addition of Entresto, spironolactone, and SGLT2i once off IV milrinone and renal function improves pending clinical course post-procedure   Plan: 1) Medication changes recommended at this time: - Hold or decrease digoxin to 0.0625 mg daily   2) Patient assistance: - None needed for now  3)  Education  - To be completed prior to discharge  Richardine Service, PharmD, BCPS Heart Failure Stewardship Pharmacist Phone [phone]

## 2020-12-28 NOTE — Progress Notes (Signed)
33 Did not see pt for ambulation as HR up earlier when walking with Mobility specialist.. Will follow up after surgery. Graylon Good RN BSN 12/28/2020 2:43 PM

## 2020-12-28 NOTE — Consult Note (Signed)
Department of Dental Medicine     INPATIENT CONSULTATION  Service Date:   12/28/2020 Admitted Date:  12/19/2020  Patient Name:  [name] Date of Birth:   1956-04-04 Medical Record Number: [id number]  Referring Provider:              Modesto Charon, MD  PLAN & RECOMMENDATIONS   > There are no current signs of acute odontogenic infection including abscess, edema or erythema, or suspicious lesion requiring biopsy.  She does have generalized plaque and calculus build-up. >> Recommend the patient establish care/reestablish care at an outside dental office of her choice after her surgery and when she is medically optimized to decrease the risk for postoperative systemic infection and complications.  Due to the patient having trouble scheduling at an outside office who accepts her insurance, offered the option for her to schedule an appointment in our clinic for comprehensive dental care (after she has been cleared by her medical team for routine/elective dental work), and she is agreeable to this plan. >>> Plan to discuss case with medical team.  There are no current recommendations for dental intervention prior to cardiac surgery on 5/11.  >>  Discussed in detail all treatment options with the patient and they are agreeable to the plan.   Thank you for consulting with Hospital Dentistry and for the opportunity to participate in this patient's treatment.  Should you have any questions or concerns, please contact the [facility] at [phone].   12/28/2020     CONSULT NOTE   COVID 19 SCREENING: The patient denies symptoms concerning for COVID-19 infection including fever, chills, cough, or newly developed shortness of breath.   HISTORY OF PRESENT ILLNESS: >> [name] is a very pleasant 65 y.o. female with h/o stage 4 CKD, HTN, CHF, hyperlipidemia, HTN, persistent atrial fibrillation on Eliquis (currently bridging with heparin), OSA, asthma/COPD, GERD  and DM2 who is currently admitted for severe mitral and tricuspid regurgitation and is anticipating MVR on 5/11.  Hospital dentistry was consulted to evaluate the patient as part of their pre-cardiac surgery work-up.  DENTAL HISTORY: >The patient reports that she most recently went to her primary dentist ([facility] in [place] year for an extraction.  She states that she was waiting to receive a follow-up phone call to schedule a cleaning and exam, but was unsuccessful and believes this is because of her insurance and/or poor communication by the office.  Her biggest concern is missing teeth, particularly her front tooth (points to #8), but denies any dental/orofacial pain or sensitivity. >> Patient is able to manage oral secretions.  Patient denies dysphagia, odynophagia, dysphonia.  Patient denies fever, rigors and malaise.   CHIEF COMPLAINT:  Medically necessary preoperative dental consultation.   Patient Active Problem List   Diagnosis Date Noted  . CHF exacerbation ([place]) 12/21/2020  . Acute on chronic heart failure ([place]) 12/19/2020  . Atrial fibrillation, permanent ([place]) 11/02/2020  . Chronic combined systolic and diastolic heart failure ([place]) 05/06/2020  . NICM (nonischemic cardiomyopathy) ([place]) 04/01/2020  . Family history of heart disease 04/01/2020  . Mitral regurgitation 04/01/2020  . Severe tricuspid regurgitation 04/01/2020  . Closed right ankle fracture 09/24/2019  . DKA, type 2 ([place]) 09/24/2019  . HHNC (hyperglycemic hyperosmolar nonketotic coma) ([place]) 02/21/2019  . Hypokalemia 02/21/2019  . Acute kidney injury superimposed on CKD ([place]) 02/21/2019  . Acute on chronic left systolic heart failure ([place]) 07/01/2018  . Congestive heart failure with left ventricular diastolic dysfunction, acute ([place])  07/01/2018  . Right upper lobe pneumonia 05/14/2018  . Atrial fibrillation with RVR ([place]) 05/14/2018  . Chronic atrial fibrillation ([place]) 12/26/2017  .  Diastolic dysfunction 95/04/3266  . Diabetes mellitus type 2 in obese ([place]) 12/26/2017  . Nausea vomiting and diarrhea   . Chest pain 02/26/2012  . OSA (obstructive sleep apnea) 09/21/2011  . Hypoxia 09/21/2011  . Diabetes mellitus ([place]) 02/04/2010  . Morbid obesity ([place]) 02/04/2010  . Atrial fibrillation ([place]) 02/04/2010  . Gastroparesis 02/04/2010  . Acute gastroenteritis 02/04/2010   Past Medical History:  Diagnosis Date  . Allergic rhinitis   . Arthritis   . Asthma   . Chest pain 12/27/2017  . Chronic diastolic CHF (congestive heart failure) ([place])   . COPD (chronic obstructive pulmonary disease) ([place])   . Depression   . DM (diabetes mellitus) ([place])   . DVT (deep vein thrombosis) in pregnancy   . Dysrhythmia    atrial fibrilation  . GERD (gastroesophageal reflux disease)   . Headache(784.0)   . HTN (hypertension)   . Hyperlipidemia   . Obesity   . Pneumonia 04/2018   RIGHT LOBE  . Shortness of breath   . Sleep apnea    compliant with CPAP   Past Surgical History:  Procedure Laterality Date  . ABDOMINAL HYSTERECTOMY    . BUBBLE STUDY  11/26/2020   Procedure: BUBBLE STUDY;  Surgeon: Buford Dresser, MD;  Location: [facility];  Service: Cardiovascular;;  . CARDIOVERSION N/A 05/06/2020   Procedure: CARDIOVERSION;  Surgeon: Buford Dresser, MD;  Location: [facility] ENDOSCOPY;  Service: Cardiovascular;  Laterality: N/A;  . CARDIOVERSION N/A 11/04/2020   Procedure: CARDIOVERSION;  Surgeon: Lelon Perla, MD;  Location: [facility] ENDOSCOPY;  Service: Cardiovascular;  Laterality: N/A;  . CATARACT EXTRACTION, BILATERAL    . CHOLECYSTECTOMY    . COLONOSCOPY WITH PROPOFOL N/A 03/05/2015   Procedure: COLONOSCOPY WITH PROPOFOL;  Surgeon: Carol Ada, MD;  Location: [facility] ENDOSCOPY;  Service: Endoscopy;  Laterality: N/A;  . DIAGNOSTIC LAPAROSCOPY    . ingrown hallux Left   . KNEE SURGERY    . LEFT HEART CATH AND CORONARY ANGIOGRAPHY N/A 12/31/2017   Procedure: LEFT HEART CATH  AND CORONARY ANGIOGRAPHY;  Surgeon: Charolette Forward, MD;  Location: [facility] CV LAB;  Service: Cardiovascular;  Laterality: N/A;  . RIGHT HEART CATH N/A 12/22/2020   Procedure: RIGHT HEART CATH;  Surgeon: Larey Dresser, MD;  Location: [facility] LAB;  Service: Cardiovascular;  Laterality: N/A;  . TEE WITHOUT CARDIOVERSION N/A 11/26/2020   Procedure: TRANSESOPHAGEAL ECHOCARDIOGRAM (TEE);  Surgeon: Buford Dresser, MD;  Location: [facility] ENDOSCOPY;  Service: Cardiovascular;  Laterality: N/A;   Allergies  Allergen Reactions  . Sulfa Antibiotics Itching   Current Facility-Administered Medications  Medication Dose Route Frequency Provider Last Rate Last Admin  . 0.9 %  sodium chloride infusion  250 mL Intravenous PRN Gifford Shave, MD      . acetaminophen (TYLENOL) tablet 650 mg  650 mg Oral Q4H PRN Gifford Shave, MD   650 mg at 12/28/20 1245  . albuterol (PROVENTIL) (2.5 MG/3ML) 0.083% nebulizer solution 2.5 mg  2.5 mg Nebulization Q4H PRN Gifford Shave, MD      . alum & mag hydroxide-simeth (MAALOX/MYLANTA) 200-200-20 MG/5ML suspension 30 mL  30 mL Oral Q4H PRN Zenia Resides, MD   30 mL at 12/27/20 1611  . amiodarone (PACERONE) tablet 200 mg  200 mg Oral BID Gifford Shave, MD   200 mg at 12/28/20 1010  . [START ON 12/29/2020]  ceFAZolin (ANCEF) IVPB 2g/100 mL premix  2 g Intravenous To OR Skeet Simmer, RPH      . [START ON 12/29/2020] ceFAZolin (ANCEF) IVPB 2g/100 mL premix  2 g Intravenous To OR Skeet Simmer, RPH      . Chlorhexidine Gluconate Cloth 2 % PADS 6 each  6 each Topical Daily Lind Covert, MD   6 each at 12/28/20 1015  . cycloSPORINE (RESTASIS) 0.05 % ophthalmic emulsion 1 drop  1 drop Both Eyes BID Gifford Shave, MD   1 drop at 12/28/20 1010  . [START ON 12/29/2020] dexmedetomidine (PRECEDEX) 400 MCG/100ML (4 mcg/mL) infusion  0.1-0.7 mcg/kg/hr Intravenous To OR Skeet Simmer, RPH      . [START ON 12/29/2020] diazepam (VALIUM) tablet 2 mg  2 mg  Oral Once [name], MD      . digoxin Fonnie Birkenhead) tablet 0.125 mg  0.125 mg Oral Daily Gifford Shave, MD   0.125 mg at 12/28/20 1010  . DULoxetine (CYMBALTA) DR capsule 60 mg  60 mg Oral Daily Gifford Shave, MD   60 mg at 12/28/20 1010  . [START ON 12/29/2020] EPINEPHrine (ADRENALIN) 4 mg in NS 250 mL (0.016 mg/mL) premix infusion  0-10 mcg/min Intravenous To OR Skeet Simmer, RPH      . gabapentin (NEURONTIN) capsule 600 mg  600 mg Oral BID Gifford Shave, MD   600 mg at 12/28/20 1010  . [START ON 12/29/2020] heparin 30,000 units/NS 1000 mL solution for CELLSAVER   Other To OR Skeet Simmer, [facility]      . heparin ADULT infusion 100 units/mL (25000 units/293mL)  1,500 Units/hr Intravenous Continuous Zenia Resides, MD 15 mL/hr at 12/28/20 0815 1,500 Units/hr at 12/28/20 0815  . [START ON 12/29/2020] heparin sodium (porcine) 2,500 Units, papaverine 30 mg in electrolyte-148 (PLASMALYTE-148) 500 mL irrigation   Irrigation To OR Skeet Simmer, RPH      . hydrocortisone cream 1 %   Topical BID PRN Zenia Resides, MD   Given at 12/25/20 2154  . insulin aspart (novoLOG) injection 0-15 Units  0-15 Units Subcutaneous TID WC Espinoza, Alejandra, DO   3 Units at 12/28/20 0659  . insulin detemir (LEVEMIR) injection 20 Units  20 Units Subcutaneous BID Gifford Shave, MD   20 Units at 12/28/20 1010  . [START ON 12/29/2020] insulin regular, human (MYXREDLIN) 100 units/ 100 mL infusion   Intravenous To OR Skeet Simmer, [facility]      . [START ON 12/29/2020] magnesium sulfate (IV Push/IM) injection 40 mEq  40 mEq Other To OR Skeet Simmer, [facility]      . menthol-cetylpyridinium (CEPACOL) lozenge 3 mg  1 lozenge Oral PRN Sharion Settler, DO      . [START ON 12/29/2020] metoprolol tartrate (LOPRESSOR) tablet 12.5 mg  12.5 mg Oral Once [name], MD      . milrinone (PRIMACOR) 20 MG/100 ML (0.2 mg/mL) infusion  0.125 mcg/kg/min Intravenous Continuous Freada Bergeron, MD 4.87 mL/hr  at 12/28/20 1125 0.125 mcg/kg/min at 12/28/20 1125  . [START ON 12/29/2020] milrinone (PRIMACOR) 20 MG/100 ML (0.2 mg/mL) infusion  0.3 mcg/kg/min Intravenous To OR Skeet Simmer, RPH      . [START ON 12/29/2020] nitroGLYCERIN 50 mg in dextrose 5 % 250 mL (0.2 mg/mL) infusion  2-200 mcg/min Intravenous To OR Skeet Simmer, RPH      . [START ON 12/29/2020] norepinephrine (LEVOPHED) 4mg  in 226mL premix infusion  0-40 mcg/min Intravenous To  OR Skeet Simmer,  [facility]      . [START ON 12/29/2020] phenylephrine (NEOSYNEPHRINE) 20-0.9 MG/250ML-% infusion  30-200 mcg/min Intravenous To OR Skeet Simmer, RPH      . polyethylene glycol (MIRALAX / GLYCOLAX) packet 17 g  17 g Oral Daily Espinoza, Alejandra, DO   17 g at 12/26/20 1010  . [START ON 12/29/2020] potassium chloride injection 80 mEq  80 mEq Other To OR Skeet Simmer, [facility]      . rosuvastatin (CRESTOR) tablet 10 mg  10 mg Oral Daily Gifford Shave, MD   10 mg at 12/28/20 1010  . senna (SENOKOT) tablet 8.6 mg  1 tablet Oral Daily Espinoza, Alejandra, DO   8.6 mg at 12/28/20 1010  . sodium chloride flush (NS) 0.9 % injection 10-40 mL  10-40 mL Intracatheter Q12H Chambliss, Jeb Levering, MD   10 mL at 12/25/20 2030  . sodium chloride flush (NS) 0.9 % injection 10-40 mL  10-40 mL Intracatheter PRN Chambliss, Marshall L, MD      . sodium chloride flush (NS) 0.9 % injection 3 mL  3 mL Intravenous Q12H Gifford Shave, MD   3 mL at 12/27/20 0906  . sodium chloride flush (NS) 0.9 % injection 3 mL  3 mL Intravenous PRN Gifford Shave, MD   3 mL at 12/21/20 1031  . sodium chloride flush (NS) 0.9 % injection 3 mL  3 mL Intravenous Q12H Kathyrn Drown D, NP   3 mL at 12/28/20 1015  . temazepam (RESTORIL) capsule 15 mg  15 mg Oral Once PRN [name], MD      . torsemide [facility]) tablet 40 mg  40 mg Oral BID Chandrasekhar, Mahesh A, MD      . Derrill Memo ON 12/29/2020] tranexamic acid (CYKLOKAPRON) 2,500 mg in sodium chloride 0.9 % 250 mL (10 mg/mL)  infusion  1.5 mg/kg/hr Intravenous To OR Skeet Simmer, RPH      . [START ON 12/29/2020] tranexamic acid (CYKLOKAPRON) bolus via infusion - over 30 minutes 1,881 mg  15 mg/kg Intravenous To OR Skeet Simmer, RPH      . [START ON 12/29/2020] tranexamic acid (CYKLOKAPRON) pump prime solution 251 mg  2 mg/kg Intracatheter To OR Skeet Simmer, RPH      . [START ON 12/29/2020] vancomycin (VANCOREADY) IVPB 1500 mg/300 mL  1,500 mg Intravenous To OR Skeet Simmer, [facility]        LABS: Lab Results  Component Value Date   WBC 9.5 12/28/2020   HGB 12.6 12/28/2020   HCT 40.3 12/28/2020   MCV 89.2 12/28/2020   PLT 230 12/28/2020      Component Value Date/Time   NA 134 (L) 12/28/2020 0451   NA 144 11/22/2020 1527   K 3.6 12/28/2020 0451   CL 97 (L) 12/28/2020 0451   CO2 29 12/28/2020 0451   GLUCOSE 162 (H) 12/28/2020 0451   BUN 33 (H) 12/28/2020 0451   BUN 19 11/22/2020 1527   CREATININE 2.04 (H) 12/28/2020 0451   CALCIUM 9.5 12/28/2020 0451   GFRNONAA 27 (L) 12/28/2020 0451   GFRAA 33 (L) 09/08/2020 1054   Lab Results  Component Value Date   INR 1.5 (H) 03/02/2020   INR 1.8 (H) 01/15/2020   INR 1.64 (H) 10/25/2014   No results found for: PTT  Social History   Socioeconomic History  . Marital status: Significant Other    Spouse name: Not on file  . Number of children: 2  . Years  of education: Not on file  . Highest education level: Some college, no degree  Occupational History  . Not on file  Tobacco Use  . Smoking status: Never Smoker  . Smokeless tobacco: Never Used  Vaping Use  . Vaping Use: Never used  Substance and Sexual Activity  . Alcohol use: No  . Drug use: No  . Sexual activity: Not Currently  Other Topics Concern  . Not on file  Social History Narrative   Pt lives in [place] with spouse.  2 grown children.   Previously worked in [employer] at [employer].  Now on disability   Social Determinants of Health   Financial Resource Strain: Medium Risk  .  Difficulty of Paying Living Expenses: Somewhat hard  Food Insecurity: No Food Insecurity  . Worried About Charity fundraiser in the Last Year: Never true  . Ran Out of Food in the Last Year: Never true  Transportation Needs: Unmet Transportation Needs  . Lack of Transportation (Medical): Yes  . Lack of Transportation (Non-Medical): No  Physical Activity: Not on file  Stress: Not on file  Social Connections: Not on file  Intimate Partner Violence: Not on file   Family History  Problem Relation Age of Onset  . Cancer Mother   . Hypertension Mother   . Cancer Father   . CAD Other   . Hypertension Sister   . Hypertension Brother     REVIEW OF SYSTEMS: Reviewed with the patient as per HPI. PSYCH: Patient denies having dental phobia.  VITAL SIGNS: BP 122/81   Pulse 95   Temp 98.2 F (36.8 C) (Oral)   Resp 18   Ht 5\' 7"  (1.702 m)   Wt 125.4 kg   SpO2 98%   BMI 43.29 kg/m    PHYSICAL EXAM: >> General:  Well-developed, comfortable and in no apparent distress. >> Neurological:  Alert and oriented to person, place and  time. >> Extraoral:  Facial symmetry present without any edema or erythema.  No swelling or lymphadenopathy.  TMJ asymptomatic without clicks or crepitations. >> Intraoral:  Soft tissues appear well-perfused and mucous membranes moist.  FOM and vestibules soft and not raised. Oral cavity without mass or lesion. No signs of infection, parulis, sinus tract, edema or erythema evident upon exam.   DENTAL EXAM: All clinical findings charted.    >> Dentition:  Overall fair remaining dentition.  Missing teeth, existing restorations.   >> The patient is maintaining fair oral hygiene.  >> Periodontal: Pink, healthy gingival tissue with blunted papilla.  Generalized plaque and localized calculus accumulation.  Gingival recession. >> Caries: No clinical caries evident.  >> Removable/Fixed Prosthodontics: Patient denies wearing partial dentures.  She is interested in  having partials made in the future to replace missing teeth. >> Occlusion: Unable to assess molar occlusion.  Non-functional teeth numbers 5 and 20. Supra-erupted tooth #5. >> Other findings: Incisal attrition on #22-#27. Diastema between teeth #9#10 and #21#22.   RADIOGRAPHIC EXAM:  12/24/20 Orthopanogram interpreted. >> Condyles seated bilaterally in fossas.  No evidence of abnormal pathology.  All visualized osseous structures appear WNL. >> Localized mild to moderate horizontal bone loss. Missing teeth, existing restorations, supra-erupted and rotated tooth #5. #8 healing extraction site.   ASSESSMENT:  1. Severe mitral/tricuspid regurgitation 2. Preoperative dental consultation 3. Long-term use of anticoagulation (on Eliquis) 4. Missing teeth 5. Accretions on teeth 6. Gingivitis 7. Gingival recession, generalized 8. Attrition/wear 9. Diastema(s) 10. Postoperative bleeding risk   PLAN AND RECOMMENDATIONS: >  I discussed the risks, benefits, and complications of various scenarios with the patient in relationship to their medical and dental conditions, which included systemic infection such as endocarditis, bacteremia or other serious issues that could potentially occur either before, during or after their anticipated surgery if dental/oral concerns are not addressed.  I explained that if any chronic or acute dental/oral infection(s) are addressed and subsequently not maintained following medical optimization and recovery, their risk of the previously mentioned complications are just as high and could potentially occur postoperatively.  I explained all significant findings of the dental consultation with the patient including generalized plaque and tartar build-up on her teeth and around her gums, and the recommended care including a good cleaning and routine dental exam in order to optimize them following heart surgery from a dental standpoint.  The patient verbalized understanding of all  findings, discussion, and recommendations. >> We then discussed various treatment options to include no treatment, multiple extractions with alveoloplasty, pre-prosthetic surgery as indicated, periodontal therapy, dental restorations, root canal therapy, crown and bridge therapy, implant therapy, and replacement of missing teeth as indicated.  The patient verbalized understanding of all options.  Since the patient reports that she has been having difficulty returning to her dental office/scheduling an appointment to establish care due to insurance issues, I gave her the option to schedule an outpatient appointment at our hospital dental clinic for a new patient exam after she has recovered from her surgery and is medically optimized/cleared by her medical team for elective/routine dental care.  She will need antibiotic prophylaxis as well s/p MVR.  She verbalized understanding and wishes to proceed with scheduling an appointment in our dental clinic upon discharge and recovery. >>>  Plan to discuss all findings and recommendations with medical team and coordinate future care as needed.   <> The patient tolerated today's visit well.  All questions and concerns were addressed and answered before conclusion of the consultation.  [place] Benson Norway, D.M.D.

## 2020-12-28 NOTE — Progress Notes (Signed)
[place] for Heparin (holding apixaban for procedure) Indication: atrial fibrillation  Allergies  Allergen Reactions  . Sulfa Antibiotics Itching    Patient Measurements: Height: 5\' 7"  (170.2 cm) Weight: 125.4 kg (276 lb 6.4 oz) IBW/kg (Calculated) : 61.6 Heparin Dosing Weight: 92.45 kg  Vital Signs: Temp: 98.2 F (36.8 C) (05/10 0540) Temp Source: Oral (05/10 0540) BP: 127/87 (05/10 0540) Pulse Rate: 95 (05/10 0540)  Labs: Recent Labs    12/26/20 0142 12/27/20 0435 12/28/20 0451  HGB 13.1 12.9 12.6  HCT 39.8 40.8 40.3  PLT 240 228 230  APTT 100* 85* 78*  HEPARINUNFRC >1.10* 0.76* 0.59  CREATININE 2.03* 1.96* 2.04*    Estimated Creatinine Clearance: 38.3 mL/min (A) (by C-G formula based on SCr of 2.04 mg/dL (H)).   Medical History: Past Medical History:  Diagnosis Date  . Allergic rhinitis   . Arthritis   . Asthma   . Chest pain 12/27/2017  . Chronic diastolic CHF (congestive heart failure) ([place])   . COPD (chronic obstructive pulmonary disease) ([place])   . Depression   . DM (diabetes mellitus) ([place])   . DVT (deep vein thrombosis) in pregnancy   . Dysrhythmia    atrial fibrilation  . GERD (gastroesophageal reflux disease)   . Headache(784.0)   . HTN (hypertension)   . Hyperlipidemia   . Obesity   . Pneumonia 04/2018   RIGHT LOBE  . Shortness of breath   . Sleep apnea    compliant with CPAP    Assessment: CB 65 y.o female presenting with CHF exacerbation. Pt has hx of Afib on Eliquis 5 mg BID at home, now held for surgery. TEE with severe MR/TR now awaiting repair planned for 5/11. Pharmacy consulted for heparin  APTT and heparin level therapeutic and correlating. CBC stable. Pt had one large bruise on LUE. No other signs of bleeding. Will follow heparin levels from now  Goal of Therapy:  Heparin level 0.3-0.7 units/ml Monitor platelets by anticoagulation protocol: Yes   Plan:  Continue Heparin to 1500  units/hr  Daily heparin level, CBC Monitor for signs/symptoms of bleeding  F/u restart apixaban when no procedures   Debria Garret, Student Pharmacist

## 2020-12-28 NOTE — Telephone Encounter (Signed)
Copied from [place] [id number]. Topic: General - Inquiry >> Dec 27, 2020  1:52 PM Scherrie Gerlach wrote: Reason for CRM: pt wants [name] to know she is scheduled for surgery on Wed 8:15 am.  Pt is at [facility] room 308  This is an [place]

## 2020-12-28 NOTE — Progress Notes (Signed)
Mobility Specialist Criteria Algorithm Info.  Mobility Team: HOB elevated: Activity: Ambulated in hall; Transferred to/from [facility]; Transferred:  Bed to chair (to chair after ambulation) Range of motion: Active; All extremities Level of assistance: Modified independent, requires aide device or extra time Assistive device: Four wheel walker Minutes sitting in chair:  Minutes stood: 6 minutes Minutes ambulated: 6 minutes Distance ambulated (ft): 360 ft (240 + 120) Mobility response: Tolerated well; RN notified (HR elevated 170bmp during ambulation) Bed Position: Chair  Prior to ambulation completed education on physical activity recommendations and precautions post surgery. Pt with receptive feedback. Ambulated in hallway 360 feet with slow steady gait, mod I with Rollator. Required seated rest x1 as HR was elevated 170, no complaints of dizziness/lightheadedness but was SOB. Subsided with rest, HR down 130+. Patient to chair after ambulation. Tolerated ambulation well without complaint or incident and was sitting in recliner with all needs met.   12/28/2020 12:43 PM

## 2020-12-28 NOTE — Progress Notes (Signed)
PT Cancellation Note  Patient Details Name: [name] MRN: [id number] DOB: 27-Dec-1955   Cancelled Treatment:    Reason Eval/Treat Not Completed: Patient declined, no reason specified;Other (comment) Pt reports feeling tired from a busy morning, working with mobility tech and cardiac rehab. Plan for OR tomorrow so will follow up post surgery.    Marguarite Arbour A Aleasha Fregeau 12/28/2020, 2:32 PM Marisa Severin, PT, DPT Acute Rehabilitation Services Pager [phone] Office [phone]

## 2020-12-28 NOTE — Progress Notes (Signed)
Occupational Therapy Treatment Patient Details Name: [name] MRN: [id number] DOB: June 09, 1956 Today's Date: 12/28/2020    History of present illness Patient is a 65 y/o female who presents on 12/19/20 with SOB, chest pain, N/V and emesis. Admitted with CHF exacerbation. Plan for surgery on Wednesday, 12/29/20. PMH includes HTN. DM2, A-fib, CHF, OSA, DVT, COPD.   OT comments  Pt continues to progress with functional goals. OT will continue to follow acutely to maximize level of function and safety  Follow Up Recommendations  No OT follow up;Supervision - Intermittent    Equipment Recommendations  Tub/shower bench;Other (comment) (reacher, LH bath sponge)    Recommendations for Other Services      Precautions / Restrictions Precautions Precautions: Other (comment);Fall Precaution Comments: watch HR, hx of falls Restrictions Weight Bearing Restrictions: No       Mobility Bed Mobility               General bed mobility comments: pt in recliner upon arrival    Transfers Overall transfer level: Needs assistance Equipment used: None;Rolling walker (2 wheeled) Transfers: Sit to/from Stand Sit to Stand: Min guard         General transfer comment: Min guard for safety, reaching for support initially    Balance Overall balance assessment: History of Falls;Needs assistance Sitting-balance support: Feet supported;No upper extremity supported Sitting balance-Leahy Scale: Good     Standing balance support: During functional activity;Bilateral upper extremity supported;No upper extremity supported Standing balance-Leahy Scale: Fair                             ADL either performed or assessed with clinical judgement   ADL       Grooming: Standing       Lower Body Bathing: Min guard;Sitting/lateral leans;Sit to/from stand       Lower Body Dressing: Min guard;Sitting/lateral leans;Sit to/from stand   Toilet Transfer: Ambulation;Comfort  height toilet;Grab bars;Min guard;RW   Toileting- Clothing Manipulation and Hygiene: Supervision/safety;Sit to/from stand       Functional mobility during ADLs: Min guard       Vision Baseline Vision/History: Wears glasses Patient Visual Report: No change from baseline     Perception     Praxis      Cognition Arousal/Alertness: Awake/alert Behavior During Therapy: WFL for tasks assessed/performed Overall Cognitive Status: Within Functional Limits for tasks assessed                                          Exercises     Shoulder Instructions       General Comments      Pertinent Vitals/ Pain       Pain Assessment: No/denies pain Faces Pain Scale: No hurt Pain Intervention(s): Repositioned  Home Living                                          Prior Functioning/Environment              Frequency  Min 2X/week        Progress Toward Goals  OT Goals(current goals can now be found in the care plan section)  Progress towards OT goals: OT to reassess next treatment  Acute Rehab OT Goals Patient Stated Goal:  To be able to walk longer distances again.  Plan Discharge plan remains appropriate    Co-evaluation                 AM-PAC OT "6 Clicks" Daily Activity     Outcome Measure   Help from another person eating meals?: None Help from another person taking care of personal grooming?: A Little Help from another person toileting, which includes using toliet, bedpan, or urinal?: A Little Help from another person bathing (including washing, rinsing, drying)?: A Little Help from another person to put on and taking off regular upper body clothing?: None Help from another person to put on and taking off regular lower body clothing?: A Little 6 Click Score: 20    End of Session Equipment Utilized During Treatment: Rolling walker  OT Visit Diagnosis: Unsteadiness on feet (R26.81);History of falling (Z91.81);Pain    Activity Tolerance Patient limited by fatigue   Patient Left in chair;with call bell/phone within reach   Nurse Communication          Time: 1552-0802 OT Time Calculation (min): 19 min  Charges: OT General Charges $OT Visit: 1 Visit OT Treatments $Self Care/Home Management : 8-22 mins     Britt Bottom 12/28/2020, 3:00 PM

## 2020-12-28 NOTE — Care Management Important Message (Signed)
Important Message  Patient Details  Name: [name] MRN: [id number] Date of Birth: [birth year]   Medicare Important Message Given:  Yes     Shelda Altes 12/28/2020, 10:22 AM

## 2020-12-28 NOTE — Progress Notes (Signed)
Progress Note  Patient Name: [name] Date of Encounter: 12/28/2020  Primary Cardiologist: Buford Dresser, MD   Subjective   Overnight able to work with PT. Patient notes that she is having increase bruising on blood thinner.  Nervous prior to surgery.  Inpatient Medications    Scheduled Meds: . amiodarone  200 mg Oral BID  . Chlorhexidine Gluconate Cloth  6 each Topical Daily  . cycloSPORINE  1 drop Both Eyes BID  . digoxin  0.125 mg Oral Daily  . DULoxetine  60 mg Oral Daily  . furosemide  80 mg Intravenous BID  . gabapentin  600 mg Oral BID  . insulin aspart  0-15 Units Subcutaneous TID WC  . insulin detemir  20 Units Subcutaneous BID  . polyethylene glycol  17 g Oral Daily  . rosuvastatin  10 mg Oral Daily  . senna  1 tablet Oral Daily  . sodium chloride flush  10-40 mL Intracatheter Q12H  . sodium chloride flush  3 mL Intravenous Q12H  . sodium chloride flush  3 mL Intravenous Q12H   Continuous Infusions: . sodium chloride    . heparin 1,500 Units/hr (12/28/20 0815)  . milrinone 0.125 mcg/kg/min (12/27/20 1416)   PRN Meds: sodium chloride, acetaminophen, albuterol, alum & mag hydroxide-simeth, hydrocortisone cream, menthol-cetylpyridinium, sodium chloride flush, sodium chloride flush   Vital Signs    Vitals:   12/27/20 0504 12/27/20 2039 12/28/20 0540 12/28/20 0547  BP: 137/74 108/79 127/87   Pulse: (!) 107 96 95   Resp: 20 20 18    Temp: 98 F (36.7 C) (!) 97.5 F (36.4 C) 98.2 F (36.8 C)   TempSrc: Oral Oral Oral   SpO2: 100% 93% 98%   Weight: 123.1 kg   125.4 kg  Height:        Intake/Output Summary (Last 24 hours) at 12/28/2020 0902 Last data filed at 12/28/2020 0659 Gross per 24 hour  Intake 1497.44 ml  Output 3350 ml  Net -1852.56 ml   Filed Weights   12/26/20 0015 12/27/20 0504 12/28/20 0547  Weight: 126 kg 123.1 kg 125.4 kg    Telemetry    AF rates 105 unless with activit - Personally Reviewed  ECG    No New -  Personally Reviewed  Physical Exam   GEN: No acute distress.  Obese female Neck: No JVD but thick neck difficult exam Cardiac: Irregular tachycardia;  Distant heart sounds, II/VI holosystolic murmur Respiratory: Clear to ausculation with good air movement GI: Soft, nontender, non-distended  MS: No edema; No deformity.  Neuro:  Nonfocal  Psych: Normal affect   Labs    Chemistry Recent Labs  Lab 12/26/20 0142 12/27/20 0435 12/28/20 0451  NA 137 137 134*  K 3.4* 3.5 3.6  CL 97* 97* 97*  CO2 31 31 29   GLUCOSE 176* 171* 162*  BUN 27* 30* 33*  CREATININE 2.03* 1.96* 2.04*  CALCIUM 9.5 9.5 9.5  GFRNONAA 27* 28* 27*  ANIONGAP 9 9 8      Hematology Recent Labs  Lab 12/26/20 0142 12/27/20 0435 12/28/20 0451  WBC 8.6 9.8 9.5  RBC 4.52 4.59 4.52  HGB 13.1 12.9 12.6  HCT 39.8 40.8 40.3  MCV 88.1 88.9 89.2  MCH 29.0 28.1 27.9  MCHC 32.9 31.6 31.3  RDW 14.1 14.1 14.0  PLT 240 228 230    Cardiac EnzymesNo results for input(s): TROPONINI in the last 168 hours. No results for input(s): TROPIPOC in the last 168 hours.   BNPNo results for  input(s): BNP, PROBNP in the last 168 hours.   DDimer No results for input(s): DDIMER in the last 168 hours.   Radiology    No results found.  Cardiac Studies   Transthoracic Echocardiogram: Date: 07/08/20 Results: Eccentric MR; at least moderate  1. Left ventricular ejection fraction, by estimation, is 45 to 50%. The  left ventricle has mildly decreased function. The left ventricle  demonstrates global hypokinesis. Left ventricular diastolic function could  not be evaluated.   2. Right ventricular systolic function was not well visualized. The right  ventricular size is moderately enlarged. There is normal pulmonary artery  systolic pressure.   3. Left atrial size was mildly dilated.   4. Right atrial size was moderately dilated.   5. Eccentric MR jet; appears mild-moderate on parasternal views, but on  apical views (see images  52, 66, 72) extends into distal atrium and may  have Coanda effect. Incompletely visualized and PV not sampled, suspect  moderate to severe MR.. The mitral  valve is rheumatic. Moderate to severe mitral valve regurgitation.   6. Tricuspid valve regurgitation is severe.   7. The aortic valve is tricuspid. Aortic valve regurgitation is not  visualized. No aortic stenosis is present.   8. The inferior vena cava is normal in size with <50% respiratory  variability, suggesting right atrial pressure of 8 mmHg.   Transesophageal Echocardiogram: Date: 11/26/20 Results: Severe MR and TR  1. Left ventricular ejection fraction, by estimation, is 45 to 50%. The  left ventricle has mildly decreased function. The left ventricle  demonstrates global hypokinesis.   2. Right ventricular systolic function is moderately reduced. The right  ventricular size is moderately enlarged.   3. Left atrial size was severely dilated. No left atrial/left atrial  appendage thrombus was detected.   4. Right atrial size was severely dilated.   5. MR more appreciable early in the study when blood pressure was normal.  MR jet appears less later in the study, likely due to reduction in blood  pressure. Posterior leaflet appears mildly restricted, with override of  anterior leaflet. Systolic flow  reversal seen in right sided pulmonary veins.. The mitral valve is  abnormal. Severe mitral valve regurgitation. The mean mitral valve  gradient is 2.0 mmHg.   6. Tricuspid valve regurgitation is severe.   7. The aortic valve is tricuspid. Aortic valve regurgitation is not  visualized. No aortic stenosis is present.   NonCardiac CT : Date: 01/15/20 Results: Aortic Atherosclerosis mPA dilation 33 mm CS dilation 15 mm  Left/Right Heart Catheterizations: Date: 12/22/20 Results: 1. Elevated R > L heart filling pressures with PAPi low but not markedly so (1.8) suggestive of a significant component of RV dysfunction.  2.  Prominent v-waves in PCWP and RA pressure tracings suggestive of significant MR and TR.  3. Cardiac index low.   Date: 12/31/2017 Results: minimal non-obstructive CAD  Prox LAD lesion is 10% stenosed.  Dist LM to Ost LAD lesion is 10% stenosed.  Ost Cx lesion is 15% stenosed.  The left ventricular systolic function is normal.  LV end diastolic pressure is normal.    Patient Profile     65 y.o. female with a history of morbid obesity, Severe TR, severe MR, HFmrEF, Morbid Obesity DM/HTN/HLd, and Longstanding persistent AF who presented with HF Decompensation  Assessment & Plan    Severe TR Severe MR Heart Failure mildly reduced Ejection Fraction  - NYHA class III, Stage D, hypervolemic, etiology from AF likely - Diuretic  regimen: Transition to PO (home dose torsemide 40 mg PO BID) - BB not added in the setting of hypotension with inodilation and stage D HF - ARNI/ARB/ACEi/MRA/SGLT2i have been not started in the setting of CKD IV - Discussed with CT surgery- will continue milrinone Longstanding Persistent Atrial Fibrillation - CHADSVASC 4+ (vascular disease not included in her assessment) on AC - continue dig 0.125 mg PO Daily - 4 g of amiodarone from 5/1->5/9; continue 200 mg PO BID - Presently planned for MR Repair or replacement, TV annuloplasty, and MAZE  Morbid Obesity/DM/HTN DM with HLD Aortic Atherosclerosis - continue rosuvastatin 10 mg PO Daily; LDL last < 70 in 2019  Discussed with patient and TCTS; Planned for surgery 12/29/20  For questions or updates, please contact [facility] Please consult [email or web address] for contact info under Cardiology/STEMI.      Signed, Werner Lean, MD  12/28/2020, 9:02 AM

## 2020-12-28 NOTE — Plan of Care (Signed)
  Problem: Activity: ?Goal: Capacity to carry out activities will improve ?Outcome: Progressing ?  ?Problem: Cardiac: ?Goal: Ability to achieve and maintain adequate cardiopulmonary perfusion will improve ?Outcome: Progressing ?  ?Problem: Education: ?Goal: Knowledge of General Education information will improve ?Description: Including pain rating scale, medication(s)/side effects and non-pharmacologic comfort measures ?Outcome: Progressing ?  ?Problem: Clinical Measurements: ?Goal: Ability to maintain clinical measurements within normal limits will improve ?Outcome: Progressing ?Goal: Will remain free from infection ?Outcome: Progressing ?  ?

## 2020-12-29 ENCOUNTER — Encounter (HOSPITAL_COMMUNITY): Payer: Self-pay | Admitting: Family Medicine

## 2020-12-29 ENCOUNTER — Encounter (HOSPITAL_COMMUNITY)
Admission: EM | Disposition: A | Discharge status: Home Health | Payer: Self-pay | Source: Home / Self Care | Attending: Thoracic Surgery (Cardiothoracic Vascular Surgery)

## 2020-12-29 ENCOUNTER — Inpatient Hospital Stay (HOSPITAL_COMMUNITY): Payer: Medicare Other | Admitting: Certified Registered Nurse Anesthetist

## 2020-12-29 ENCOUNTER — Inpatient Hospital Stay (HOSPITAL_COMMUNITY): Payer: Medicare Other

## 2020-12-29 DIAGNOSIS — I361 Nonrheumatic tricuspid (valve) insufficiency: Secondary | ICD-10-CM

## 2020-12-29 DIAGNOSIS — Z9889 Other specified postprocedural states: Secondary | ICD-10-CM

## 2020-12-29 DIAGNOSIS — I4819 Other persistent atrial fibrillation: Secondary | ICD-10-CM

## 2020-12-29 DIAGNOSIS — I340 Nonrheumatic mitral (valve) insufficiency: Secondary | ICD-10-CM

## 2020-12-29 HISTORY — PX: MITRAL VALVE REPAIR: SHX2039

## 2020-12-29 HISTORY — PX: TEE WITHOUT CARDIOVERSION: SHX5443

## 2020-12-29 HISTORY — PX: TRICUSPID VALVE REPLACEMENT: SHX816

## 2020-12-29 HISTORY — PX: MAZE: SHX5063

## 2020-12-29 LAB — POCT I-STAT 7, (LYTES, BLD GAS, ICA,H+H)
Acid-Base Excess: 6 mmol/L — ABNORMAL HIGH (ref 0.0–2.0)
Acid-Base Excess: 5 mmol/L — ABNORMAL HIGH (ref 0.0–2.0)
Acid-Base Excess: 5 mmol/L — ABNORMAL HIGH (ref 0.0–2.0)
Acid-Base Excess: 6 mmol/L — ABNORMAL HIGH (ref 0.0–2.0)
Acid-Base Excess: 7 mmol/L — ABNORMAL HIGH (ref 0.0–2.0)
Acid-Base Excess: 5 mmol/L — ABNORMAL HIGH (ref 0.0–2.0)
Acid-Base Excess: 4 mmol/L — ABNORMAL HIGH (ref 0.0–2.0)
Acid-Base Excess: 0 mmol/L (ref 0.0–2.0)
Acid-base deficit: 2 mmol/L (ref 0.0–2.0)
Bicarbonate: 31.9 mmol/L — ABNORMAL HIGH (ref 20.0–28.0)
Bicarbonate: 30.6 mmol/L — ABNORMAL HIGH (ref 20.0–28.0)
Bicarbonate: 30.7 mmol/L — ABNORMAL HIGH (ref 20.0–28.0)
Bicarbonate: 31.5 mmol/L — ABNORMAL HIGH (ref 20.0–28.0)
Bicarbonate: 32.1 mmol/L — ABNORMAL HIGH (ref 20.0–28.0)
Bicarbonate: 29.3 mmol/L — ABNORMAL HIGH (ref 20.0–28.0)
Bicarbonate: 28.5 mmol/L — ABNORMAL HIGH (ref 20.0–28.0)
Bicarbonate: 25.9 mmol/L (ref 20.0–28.0)
Bicarbonate: 24 mmol/L (ref 20.0–28.0)
Calcium, Ion: 1.24 mmol/L (ref 1.15–1.40)
Calcium, Ion: 1.05 mmol/L — ABNORMAL LOW (ref 1.15–1.40)
Calcium, Ion: 1.08 mmol/L — ABNORMAL LOW (ref 1.15–1.40)
Calcium, Ion: 1.14 mmol/L — ABNORMAL LOW (ref 1.15–1.40)
Calcium, Ion: 1.14 mmol/L — ABNORMAL LOW (ref 1.15–1.40)
Calcium, Ion: 1.11 mmol/L — ABNORMAL LOW (ref 1.15–1.40)
Calcium, Ion: 1.13 mmol/L — ABNORMAL LOW (ref 1.15–1.40)
Calcium, Ion: 1.13 mmol/L — ABNORMAL LOW (ref 1.15–1.40)
Calcium, Ion: 1.21 mmol/L (ref 1.15–1.40)
HCT: 38 % (ref 36.0–46.0)
HCT: 27 % — ABNORMAL LOW (ref 36.0–46.0)
HCT: 28 % — ABNORMAL LOW (ref 36.0–46.0)
HCT: 29 % — ABNORMAL LOW (ref 36.0–46.0)
HCT: 31 % — ABNORMAL LOW (ref 36.0–46.0)
HCT: 29 % — ABNORMAL LOW (ref 36.0–46.0)
HCT: 30 % — ABNORMAL LOW (ref 36.0–46.0)
HCT: 33 % — ABNORMAL LOW (ref 36.0–46.0)
HCT: 31 % — ABNORMAL LOW (ref 36.0–46.0)
Hemoglobin: 12.9 g/dL (ref 12.0–15.0)
Hemoglobin: 9.2 g/dL — ABNORMAL LOW (ref 12.0–15.0)
Hemoglobin: 9.5 g/dL — ABNORMAL LOW (ref 12.0–15.0)
Hemoglobin: 10.5 g/dL — ABNORMAL LOW (ref 12.0–15.0)
Hemoglobin: 9.9 g/dL — ABNORMAL LOW (ref 12.0–15.0)
Hemoglobin: 9.9 g/dL — ABNORMAL LOW (ref 12.0–15.0)
Hemoglobin: 10.2 g/dL — ABNORMAL LOW (ref 12.0–15.0)
Hemoglobin: 11.2 g/dL — ABNORMAL LOW (ref 12.0–15.0)
Hemoglobin: 10.5 g/dL — ABNORMAL LOW (ref 12.0–15.0)
O2 Saturation: 100 %
O2 Saturation: 100 %
O2 Saturation: 100 %
O2 Saturation: 100 %
O2 Saturation: 100 %
O2 Saturation: 100 %
O2 Saturation: 100 %
O2 Saturation: 99 %
O2 Saturation: 99 %
Patient temperature: 37.1
Patient temperature: 36.5
Potassium: 4.1 mmol/L (ref 3.5–5.1)
Potassium: 3.7 mmol/L (ref 3.5–5.1)
Potassium: 4.2 mmol/L (ref 3.5–5.1)
Potassium: 4.6 mmol/L (ref 3.5–5.1)
Potassium: 4.6 mmol/L (ref 3.5–5.1)
Potassium: 5.2 mmol/L — ABNORMAL HIGH (ref 3.5–5.1)
Potassium: 4.7 mmol/L (ref 3.5–5.1)
Potassium: 4.8 mmol/L (ref 3.5–5.1)
Potassium: 4.3 mmol/L (ref 3.5–5.1)
Sodium: 139 mmol/L (ref 135–145)
Sodium: 140 mmol/L (ref 135–145)
Sodium: 141 mmol/L (ref 135–145)
Sodium: 140 mmol/L (ref 135–145)
Sodium: 141 mmol/L (ref 135–145)
Sodium: 140 mmol/L (ref 135–145)
Sodium: 139 mmol/L (ref 135–145)
Sodium: 141 mmol/L (ref 135–145)
Sodium: 140 mmol/L (ref 135–145)
TCO2: 33 mmol/L — ABNORMAL HIGH (ref 22–32)
TCO2: 32 mmol/L (ref 22–32)
TCO2: 32 mmol/L (ref 22–32)
TCO2: 33 mmol/L — ABNORMAL HIGH (ref 22–32)
TCO2: 34 mmol/L — ABNORMAL HIGH (ref 22–32)
TCO2: 31 mmol/L (ref 22–32)
TCO2: 30 mmol/L (ref 22–32)
TCO2: 27 mmol/L (ref 22–32)
TCO2: 25 mmol/L (ref 22–32)
pCO2 arterial: 50.6 mmHg — ABNORMAL HIGH (ref 32.0–48.0)
pCO2 arterial: 48.4 mmHg — ABNORMAL HIGH (ref 32.0–48.0)
pCO2 arterial: 48.7 mmHg — ABNORMAL HIGH (ref 32.0–48.0)
pCO2 arterial: 47.4 mmHg (ref 32.0–48.0)
pCO2 arterial: 49.1 mmHg — ABNORMAL HIGH (ref 32.0–48.0)
pCO2 arterial: 42.2 mmHg (ref 32.0–48.0)
pCO2 arterial: 42.8 mmHg (ref 32.0–48.0)
pCO2 arterial: 47.9 mmHg (ref 32.0–48.0)
pCO2 arterial: 46.4 mmHg (ref 32.0–48.0)
pH, Arterial: 7.408 (ref 7.350–7.450)
pH, Arterial: 7.406 (ref 7.350–7.450)
pH, Arterial: 7.41 (ref 7.350–7.450)
pH, Arterial: 7.424 (ref 7.350–7.450)
pH, Arterial: 7.43 (ref 7.350–7.450)
pH, Arterial: 7.449 (ref 7.350–7.450)
pH, Arterial: 7.431 (ref 7.350–7.450)
pH, Arterial: 7.341 — ABNORMAL LOW (ref 7.350–7.450)
pH, Arterial: 7.319 — ABNORMAL LOW (ref 7.350–7.450)
pO2, Arterial: 558 mmHg — ABNORMAL HIGH (ref 83.0–108.0)
pO2, Arterial: 304 mmHg — ABNORMAL HIGH (ref 83.0–108.0)
pO2, Arterial: 344 mmHg — ABNORMAL HIGH (ref 83.0–108.0)
pO2, Arterial: 307 mmHg — ABNORMAL HIGH (ref 83.0–108.0)
pO2, Arterial: 355 mmHg — ABNORMAL HIGH (ref 83.0–108.0)
pO2, Arterial: 300 mmHg — ABNORMAL HIGH (ref 83.0–108.0)
pO2, Arterial: 336 mmHg — ABNORMAL HIGH (ref 83.0–108.0)
pO2, Arterial: 151 mmHg — ABNORMAL HIGH (ref 83.0–108.0)
pO2, Arterial: 145 mmHg — ABNORMAL HIGH (ref 83.0–108.0)

## 2020-12-29 LAB — BASIC METABOLIC PANEL
Anion gap: 8 (ref 5–15)
Anion gap: 7 (ref 5–15)
BUN: 35 mg/dL — ABNORMAL HIGH (ref 8–23)
BUN: 30 mg/dL — ABNORMAL HIGH (ref 8–23)
CO2: 31 mmol/L (ref 22–32)
CO2: 24 mmol/L (ref 22–32)
Calcium: 9.5 mg/dL (ref 8.9–10.3)
Calcium: 8.7 mg/dL — ABNORMAL LOW (ref 8.9–10.3)
Chloride: 98 mmol/L (ref 98–111)
Chloride: 107 mmol/L (ref 98–111)
Creatinine, Ser: 2.08 mg/dL — ABNORMAL HIGH (ref 0.44–1.00)
Creatinine, Ser: 1.94 mg/dL — ABNORMAL HIGH (ref 0.44–1.00)
GFR, Estimated: 26 mL/min — ABNORMAL LOW (ref >60)
GFR, Estimated: 28 mL/min — ABNORMAL LOW (ref >60)
Glucose, Bld: 219 mg/dL — ABNORMAL HIGH (ref 70–99)
Glucose, Bld: 157 mg/dL — ABNORMAL HIGH (ref 70–99)
Potassium: 3.8 mmol/L (ref 3.5–5.1)
Potassium: 4.8 mmol/L (ref 3.5–5.1)
Sodium: 137 mmol/L (ref 135–145)
Sodium: 138 mmol/L (ref 135–145)

## 2020-12-29 LAB — POCT I-STAT, CHEM 8
BUN: 33 mg/dL — ABNORMAL HIGH (ref 8–23)
BUN: 32 mg/dL — ABNORMAL HIGH (ref 8–23)
BUN: 34 mg/dL — ABNORMAL HIGH (ref 8–23)
BUN: 35 mg/dL — ABNORMAL HIGH (ref 8–23)
BUN: 37 mg/dL — ABNORMAL HIGH (ref 8–23)
BUN: 34 mg/dL — ABNORMAL HIGH (ref 8–23)
BUN: 35 mg/dL — ABNORMAL HIGH (ref 8–23)
Calcium, Ion: 1.23 mmol/L (ref 1.15–1.40)
Calcium, Ion: 1.23 mmol/L (ref 1.15–1.40)
Calcium, Ion: 1.11 mmol/L — ABNORMAL LOW (ref 1.15–1.40)
Calcium, Ion: 1.15 mmol/L (ref 1.15–1.40)
Calcium, Ion: 1.12 mmol/L — ABNORMAL LOW (ref 1.15–1.40)
Calcium, Ion: 1.11 mmol/L — ABNORMAL LOW (ref 1.15–1.40)
Calcium, Ion: 1.13 mmol/L — ABNORMAL LOW (ref 1.15–1.40)
Chloride: 102 mmol/L (ref 98–111)
Chloride: 101 mmol/L (ref 98–111)
Chloride: 102 mmol/L (ref 98–111)
Chloride: 102 mmol/L (ref 98–111)
Chloride: 104 mmol/L (ref 98–111)
Chloride: 103 mmol/L (ref 98–111)
Chloride: 103 mmol/L (ref 98–111)
Creatinine, Ser: 2.1 mg/dL — ABNORMAL HIGH (ref 0.44–1.00)
Creatinine, Ser: 2.2 mg/dL — ABNORMAL HIGH (ref 0.44–1.00)
Creatinine, Ser: 1.9 mg/dL — ABNORMAL HIGH (ref 0.44–1.00)
Creatinine, Ser: 1.9 mg/dL — ABNORMAL HIGH (ref 0.44–1.00)
Creatinine, Ser: 1.9 mg/dL — ABNORMAL HIGH (ref 0.44–1.00)
Creatinine, Ser: 1.9 mg/dL — ABNORMAL HIGH (ref 0.44–1.00)
Creatinine, Ser: 1.9 mg/dL — ABNORMAL HIGH (ref 0.44–1.00)
Glucose, Bld: 185 mg/dL — ABNORMAL HIGH (ref 70–99)
Glucose, Bld: 156 mg/dL — ABNORMAL HIGH (ref 70–99)
Glucose, Bld: 139 mg/dL — ABNORMAL HIGH (ref 70–99)
Glucose, Bld: 135 mg/dL — ABNORMAL HIGH (ref 70–99)
Glucose, Bld: 152 mg/dL — ABNORMAL HIGH (ref 70–99)
Glucose, Bld: 150 mg/dL — ABNORMAL HIGH (ref 70–99)
Glucose, Bld: 179 mg/dL — ABNORMAL HIGH (ref 70–99)
HCT: 37 % (ref 36.0–46.0)
HCT: 33 % — ABNORMAL LOW (ref 36.0–46.0)
HCT: 30 % — ABNORMAL LOW (ref 36.0–46.0)
HCT: 30 % — ABNORMAL LOW (ref 36.0–46.0)
HCT: 29 % — ABNORMAL LOW (ref 36.0–46.0)
HCT: 27 % — ABNORMAL LOW (ref 36.0–46.0)
HCT: 30 % — ABNORMAL LOW (ref 36.0–46.0)
Hemoglobin: 12.6 g/dL (ref 12.0–15.0)
Hemoglobin: 11.2 g/dL — ABNORMAL LOW (ref 12.0–15.0)
Hemoglobin: 10.2 g/dL — ABNORMAL LOW (ref 12.0–15.0)
Hemoglobin: 10.2 g/dL — ABNORMAL LOW (ref 12.0–15.0)
Hemoglobin: 9.9 g/dL — ABNORMAL LOW (ref 12.0–15.0)
Hemoglobin: 10.2 g/dL — ABNORMAL LOW (ref 12.0–15.0)
Hemoglobin: 9.2 g/dL — ABNORMAL LOW (ref 12.0–15.0)
Potassium: 4.1 mmol/L (ref 3.5–5.1)
Potassium: 3.9 mmol/L (ref 3.5–5.1)
Potassium: 4.7 mmol/L (ref 3.5–5.1)
Potassium: 4.6 mmol/L (ref 3.5–5.1)
Potassium: 5.3 mmol/L — ABNORMAL HIGH (ref 3.5–5.1)
Potassium: 4.8 mmol/L (ref 3.5–5.1)
Potassium: 5.2 mmol/L — ABNORMAL HIGH (ref 3.5–5.1)
Sodium: 139 mmol/L (ref 135–145)
Sodium: 138 mmol/L (ref 135–145)
Sodium: 137 mmol/L (ref 135–145)
Sodium: 139 mmol/L (ref 135–145)
Sodium: 139 mmol/L (ref 135–145)
Sodium: 138 mmol/L (ref 135–145)
Sodium: 139 mmol/L (ref 135–145)
TCO2: 31 mmol/L (ref 22–32)
TCO2: 28 mmol/L (ref 22–32)
TCO2: 30 mmol/L (ref 22–32)
TCO2: 31 mmol/L (ref 22–32)
TCO2: 29 mmol/L (ref 22–32)
TCO2: 28 mmol/L (ref 22–32)
TCO2: 30 mmol/L (ref 22–32)

## 2020-12-29 LAB — ECHO INTRAOPERATIVE TEE
Height: 67 in
MV Vena cont: 0.5 cm
Weight: 4416.25 oz

## 2020-12-29 LAB — CBC
HCT: 39.4 % (ref 36.0–46.0)
HCT: 34.6 % — ABNORMAL LOW (ref 36.0–46.0)
HCT: 34.7 % — ABNORMAL LOW (ref 36.0–46.0)
Hemoglobin: 12.6 g/dL (ref 12.0–15.0)
Hemoglobin: 11 g/dL — ABNORMAL LOW (ref 12.0–15.0)
Hemoglobin: 10.9 g/dL — ABNORMAL LOW (ref 12.0–15.0)
MCH: 28.4 pg (ref 26.0–34.0)
MCH: 28.3 pg (ref 26.0–34.0)
MCH: 28.1 pg (ref 26.0–34.0)
MCHC: 32 g/dL (ref 30.0–36.0)
MCHC: 31.8 g/dL (ref 30.0–36.0)
MCHC: 31.4 g/dL (ref 30.0–36.0)
MCV: 88.7 fL (ref 80.0–100.0)
MCV: 88.9 fL (ref 80.0–100.0)
MCV: 89.4 fL (ref 80.0–100.0)
Platelets: 222 10*3/uL (ref 150–400)
Platelets: 148 10*3/uL — ABNORMAL LOW (ref 150–400)
Platelets: 142 10*3/uL — ABNORMAL LOW (ref 150–400)
RBC: 4.44 MIL/uL (ref 3.87–5.11)
RBC: 3.89 MIL/uL (ref 3.87–5.11)
RBC: 3.88 MIL/uL (ref 3.87–5.11)
RDW: 13.9 % (ref 11.5–15.5)
RDW: 13.8 % (ref 11.5–15.5)
RDW: 14 % (ref 11.5–15.5)
WBC: 8.9 10*3/uL (ref 4.0–10.5)
WBC: 21.8 10*3/uL — ABNORMAL HIGH (ref 4.0–10.5)
WBC: 17.9 10*3/uL — ABNORMAL HIGH (ref 4.0–10.5)
nRBC: 0 % (ref 0.0–0.2)
nRBC: 0 % (ref 0.0–0.2)
nRBC: 0 % (ref 0.0–0.2)

## 2020-12-29 LAB — APTT: aPTT: 31 seconds (ref 24–36)

## 2020-12-29 LAB — GLUCOSE, CAPILLARY
Glucose-Capillary: 218 mg/dL — ABNORMAL HIGH (ref 70–99)
Glucose-Capillary: 150 mg/dL — ABNORMAL HIGH (ref 70–99)
Glucose-Capillary: 156 mg/dL — ABNORMAL HIGH (ref 70–99)
Glucose-Capillary: 165 mg/dL — ABNORMAL HIGH (ref 70–99)
Glucose-Capillary: 141 mg/dL — ABNORMAL HIGH (ref 70–99)
Glucose-Capillary: 137 mg/dL — ABNORMAL HIGH (ref 70–99)
Glucose-Capillary: 126 mg/dL — ABNORMAL HIGH (ref 70–99)
Glucose-Capillary: 148 mg/dL — ABNORMAL HIGH (ref 70–99)
Glucose-Capillary: 178 mg/dL — ABNORMAL HIGH (ref 70–99)
Glucose-Capillary: 182 mg/dL — ABNORMAL HIGH (ref 70–99)

## 2020-12-29 LAB — PLATELET COUNT: Platelets: 139 10*3/uL — ABNORMAL LOW (ref 150–400)

## 2020-12-29 LAB — MAGNESIUM: Magnesium: 3.6 mg/dL — ABNORMAL HIGH (ref 1.7–2.4)

## 2020-12-29 LAB — POCT I-STAT EG7
Acid-Base Excess: 5 mmol/L — ABNORMAL HIGH (ref 0.0–2.0)
Bicarbonate: 30.9 mmol/L — ABNORMAL HIGH (ref 20.0–28.0)
Calcium, Ion: 1.12 mmol/L — ABNORMAL LOW (ref 1.15–1.40)
HCT: 29 % — ABNORMAL LOW (ref 36.0–46.0)
Hemoglobin: 9.9 g/dL — ABNORMAL LOW (ref 12.0–15.0)
O2 Saturation: 76 %
Potassium: 4.3 mmol/L (ref 3.5–5.1)
Sodium: 141 mmol/L (ref 135–145)
TCO2: 32 mmol/L (ref 22–32)
pCO2, Ven: 54.1 mmHg (ref 44.0–60.0)
pH, Ven: 7.364 (ref 7.250–7.430)
pO2, Ven: 43 mmHg (ref 32.0–45.0)

## 2020-12-29 LAB — COOXEMETRY PANEL
Carboxyhemoglobin: 1.2 % (ref 0.5–1.5)
Methemoglobin: 1.1 % (ref 0.0–1.5)
O2 Saturation: 65.7 %
Total hemoglobin: 12.8 g/dL (ref 12.0–16.0)

## 2020-12-29 LAB — PROTIME-INR
INR: 1.9 — ABNORMAL HIGH (ref 0.8–1.2)
Prothrombin Time: 21.6 seconds — ABNORMAL HIGH (ref 11.4–15.2)

## 2020-12-29 LAB — FIBRINOGEN: Fibrinogen: 459 mg/dL (ref 210–475)

## 2020-12-29 LAB — HEMOGLOBIN AND HEMATOCRIT, BLOOD
HCT: 29.6 % — ABNORMAL LOW (ref 36.0–46.0)
Hemoglobin: 9.5 g/dL — ABNORMAL LOW (ref 12.0–15.0)

## 2020-12-29 SURGERY — REPAIR, MITRAL VALVE
Anesthesia: General | Site: Chest

## 2020-12-29 MED ORDER — VASOPRESSIN 20 UNITS/100 ML INFUSION FOR SHOCK
0.0000 [IU]/min | INTRAVENOUS | Status: AC
  Administered 2020-12-29: .03 [IU]/min via INTRAVENOUS
  Filled 2020-12-29: qty 100

## 2020-12-29 MED ORDER — GABAPENTIN 300 MG PO CAPS
600.0000 mg | ORAL_CAPSULE | Freq: Two times a day (BID) | ORAL | Status: DC
Start: 2020-12-30 — End: 2020-12-29

## 2020-12-29 MED ORDER — ROCURONIUM BROMIDE 10 MG/ML (PF) SYRINGE
PREFILLED_SYRINGE | INTRAVENOUS | Status: DC | PRN
  Administered 2020-12-29: 70 mg via INTRAVENOUS
  Administered 2020-12-29: 20 mg via INTRAVENOUS
  Administered 2020-12-29: 30 mg via INTRAVENOUS
  Administered 2020-12-29: 20 mg via INTRAVENOUS
  Administered 2020-12-29: 30 mg via INTRAVENOUS

## 2020-12-29 MED ORDER — CHLORHEXIDINE GLUCONATE CLOTH 2 % EX PADS
6.0000 | MEDICATED_PAD | Freq: Every day | CUTANEOUS | Status: DC
  Administered 2020-12-29 – 2021-01-12 (×15): 6 via TOPICAL

## 2020-12-29 MED ORDER — CEFAZOLIN SODIUM-DEXTROSE 2-4 GM/100ML-% IV SOLN
2.0000 g | Freq: Three times a day (TID) | INTRAVENOUS | Status: AC
  Administered 2020-12-29 – 2020-12-31 (×6): 2 g via INTRAVENOUS
  Filled 2020-12-29 (×6): qty 100

## 2020-12-29 MED ORDER — ROSUVASTATIN CALCIUM 5 MG PO TABS: 10.0000 mg | ORAL_TABLET | Freq: Every day | ORAL | Status: DC

## 2020-12-29 MED ORDER — PHENYLEPHRINE 40 MCG/ML (10ML) SYRINGE FOR IV PUSH (FOR BLOOD PRESSURE SUPPORT)
PREFILLED_SYRINGE | INTRAVENOUS | Status: DC | PRN
  Administered 2020-12-29: 40 ug via INTRAVENOUS
  Administered 2020-12-29: 80 ug via INTRAVENOUS
  Administered 2020-12-29 (×2): 40 ug via INTRAVENOUS

## 2020-12-29 MED ORDER — ACETAMINOPHEN 500 MG PO TABS
1000.0000 mg | ORAL_TABLET | Freq: Four times a day (QID) | ORAL | Status: AC
  Administered 2020-12-30 – 2021-01-03 (×14): 1000 mg via ORAL
  Filled 2020-12-29 (×16): qty 2

## 2020-12-29 MED ORDER — INSULIN REGULAR(HUMAN) IN NACL 100-0.9 UT/100ML-% IV SOLN
INTRAVENOUS | Status: DC
  Administered 2020-12-30: 5.5 [IU]/h via INTRAVENOUS
  Administered 2020-12-30: 6.5 [IU]/h via INTRAVENOUS
  Filled 2020-12-29 (×2): qty 100

## 2020-12-29 MED ORDER — SODIUM CHLORIDE 0.9% FLUSH: 3.0000 mL | INTRAVENOUS | Status: DC | PRN

## 2020-12-29 MED ORDER — SODIUM CHLORIDE (PF) 0.9 % IJ SOLN
OROMUCOSAL | Status: DC | PRN
  Administered 2020-12-29 (×3): 4 mL via TOPICAL

## 2020-12-29 MED ORDER — SODIUM CHLORIDE 0.9 % IV SOLN: INTRAVENOUS | Status: DC | PRN

## 2020-12-29 MED ORDER — VANCOMYCIN HCL IN DEXTROSE 1-5 GM/200ML-% IV SOLN
1000.0000 mg | Freq: Once | INTRAVENOUS | Status: AC
  Administered 2020-12-29: 1000 mg via INTRAVENOUS
  Filled 2020-12-29: qty 200

## 2020-12-29 MED ORDER — MAGNESIUM SULFATE 4 GM/100ML IV SOLN
4.0000 g | Freq: Once | INTRAVENOUS | Status: AC
Start: 2020-12-29 — End: 2020-12-29
  Administered 2020-12-29: 4 g via INTRAVENOUS
  Filled 2020-12-29: qty 100

## 2020-12-29 MED ORDER — LACTATED RINGERS IV SOLN: INTRAVENOUS | Status: DC | PRN

## 2020-12-29 MED ORDER — SODIUM CHLORIDE 0.9% FLUSH
3.0000 mL | Freq: Two times a day (BID) | INTRAVENOUS | Status: DC
  Administered 2020-12-30 – 2021-01-01 (×3): 3 mL via INTRAVENOUS

## 2020-12-29 MED ORDER — MIDAZOLAM HCL 5 MG/5ML IJ SOLN
INTRAMUSCULAR | Status: DC | PRN
  Administered 2020-12-29: 2 mg via INTRAVENOUS
  Administered 2020-12-29: 3 mg via INTRAVENOUS
  Administered 2020-12-29 (×3): 1 mg via INTRAVENOUS
  Administered 2020-12-29: 2 mg via INTRAVENOUS
  Administered 2020-12-29 (×2): 1 mg via INTRAVENOUS

## 2020-12-29 MED ORDER — TRAMADOL HCL 50 MG PO TABS: 50.0000 mg | ORAL_TABLET | ORAL | Status: DC | PRN

## 2020-12-29 MED ORDER — DOCUSATE SODIUM 50 MG/5ML PO LIQD: 200.0000 mg | Freq: Every day | ORAL | Status: DC

## 2020-12-29 MED ORDER — PAPAVERINE HCL 30 MG/ML IJ SOLN
INTRAMUSCULAR | Status: DC | PRN
  Administered 2020-12-29: 500 mL

## 2020-12-29 MED ORDER — ALBUMIN HUMAN 5 % IV SOLN: INTRAVENOUS | Status: DC | PRN

## 2020-12-29 MED ORDER — ALBUMIN HUMAN 5 % IV SOLN
250.0000 mL | INTRAVENOUS | Status: AC | PRN
  Administered 2020-12-29 (×4): 12.5 g via INTRAVENOUS
  Filled 2020-12-29: qty 250

## 2020-12-29 MED ORDER — ONDANSETRON HCL 4 MG/2ML IJ SOLN
INTRAMUSCULAR | Status: AC
  Filled 2020-12-29: qty 2

## 2020-12-29 MED ORDER — LACTATED RINGERS IV SOLN
500.0000 mL | Freq: Once | INTRAVENOUS | Status: AC | PRN
  Administered 2020-12-29: 500 mL via INTRAVENOUS

## 2020-12-29 MED ORDER — CALCIUM GLUCONATE-NACL 1-0.675 GM/50ML-% IV SOLN
1.0000 g | Freq: Once | INTRAVENOUS | Status: AC
  Administered 2020-12-29: 1000 mg via INTRAVENOUS
  Filled 2020-12-29: qty 50

## 2020-12-29 MED ORDER — LACTATED RINGERS IV SOLN: INTRAVENOUS | Status: DC

## 2020-12-29 MED ORDER — MORPHINE SULFATE (PF) 2 MG/ML IV SOLN
1.0000 mg | INTRAVENOUS | Status: DC | PRN
  Administered 2020-12-29: 2 mg via INTRAVENOUS
  Administered 2020-12-30: 4 mg via INTRAVENOUS
  Administered 2020-12-30 (×2): 2 mg via INTRAVENOUS
  Filled 2020-12-29: qty 2
  Filled 2020-12-29 (×3): qty 1

## 2020-12-29 MED ORDER — CHLORHEXIDINE GLUCONATE 0.12 % MT SOLN
15.0000 mL | OROMUCOSAL | Status: AC
  Administered 2020-12-29: 15 mL via OROMUCOSAL

## 2020-12-29 MED ORDER — VASOPRESSIN 20 UNITS/100 ML INFUSION FOR SHOCK
0.0000 [IU]/min | INTRAVENOUS | Status: DC
  Administered 2020-12-29: 0.02 [IU]/min via INTRAVENOUS
  Administered 2020-12-30: 0.03 [IU]/min via INTRAVENOUS
  Filled 2020-12-29: qty 100

## 2020-12-29 MED ORDER — OXYCODONE HCL 5 MG PO TABS: 5.0000 mg | ORAL_TABLET | ORAL | Status: DC | PRN

## 2020-12-29 MED ORDER — ASPIRIN 81 MG PO CHEW: 324.0000 mg | CHEWABLE_TABLET | Freq: Every day | ORAL | Status: DC

## 2020-12-29 MED ORDER — DEXTROSE 50 % IV SOLN: 0.0000 mL | INTRAVENOUS | Status: DC | PRN

## 2020-12-29 MED ORDER — PHENYLEPHRINE 40 MCG/ML (10ML) SYRINGE FOR IV PUSH (FOR BLOOD PRESSURE SUPPORT)
PREFILLED_SYRINGE | INTRAVENOUS | Status: AC
  Filled 2020-12-29: qty 10

## 2020-12-29 MED ORDER — CEFAZOLIN SODIUM-DEXTROSE 2-3 GM-%(50ML) IV SOLR
INTRAVENOUS | Status: DC | PRN
  Administered 2020-12-29: 2 g via INTRAVENOUS

## 2020-12-29 MED ORDER — FENTANYL CITRATE (PF) 250 MCG/5ML IJ SOLN
INTRAMUSCULAR | Status: DC | PRN
  Administered 2020-12-29: 100 ug via INTRAVENOUS
  Administered 2020-12-29 (×2): 50 ug via INTRAVENOUS
  Administered 2020-12-29 (×2): 100 ug via INTRAVENOUS
  Administered 2020-12-29: 200 ug via INTRAVENOUS
  Administered 2020-12-29: 100 ug via INTRAVENOUS
  Administered 2020-12-29: 50 ug via INTRAVENOUS
  Administered 2020-12-29: 100 ug via INTRAVENOUS
  Administered 2020-12-29: 50 ug via INTRAVENOUS
  Administered 2020-12-29: 100 ug via INTRAVENOUS
  Administered 2020-12-29: 50 ug via INTRAVENOUS
  Administered 2020-12-29: 150 ug via INTRAVENOUS
  Administered 2020-12-29: 50 ug via INTRAVENOUS

## 2020-12-29 MED ORDER — ORAL CARE MOUTH RINSE: 15.0000 mL | Freq: Two times a day (BID) | OROMUCOSAL | Status: DC

## 2020-12-29 MED ORDER — CHLORHEXIDINE GLUCONATE 0.12% ORAL RINSE (MEDLINE KIT)
15.0000 mL | Freq: Two times a day (BID) | OROMUCOSAL | Status: DC
  Administered 2020-12-29: 15 mL via OROMUCOSAL

## 2020-12-29 MED ORDER — PROPOFOL 10 MG/ML IV BOLUS
INTRAVENOUS | Status: DC | PRN
  Administered 2020-12-29: 30 mg via INTRAVENOUS

## 2020-12-29 MED ORDER — BISACODYL 5 MG PO TBEC
10.0000 mg | DELAYED_RELEASE_TABLET | Freq: Every day | ORAL | Status: DC
  Administered 2020-12-30 – 2021-01-12 (×8): 10 mg via ORAL
  Filled 2020-12-29 (×10): qty 2

## 2020-12-29 MED ORDER — ONDANSETRON HCL 4 MG/2ML IJ SOLN
4.0000 mg | Freq: Four times a day (QID) | INTRAMUSCULAR | Status: DC | PRN
  Administered 2020-12-30 – 2021-01-09 (×10): 4 mg via INTRAVENOUS
  Filled 2020-12-29 (×10): qty 2

## 2020-12-29 MED ORDER — METOPROLOL TARTRATE 12.5 MG HALF TABLET: 12.5000 mg | ORAL_TABLET | Freq: Two times a day (BID) | ORAL | Status: DC

## 2020-12-29 MED ORDER — SODIUM CHLORIDE 0.45 % IV SOLN: INTRAVENOUS | Status: DC | PRN

## 2020-12-29 MED ORDER — SODIUM CHLORIDE 0.9 % IV SOLN: INTRAVENOUS | Status: DC

## 2020-12-29 MED ORDER — TRANEXAMIC ACID 1000 MG/10ML IV SOLN
1.5000 mg/kg/h | INTRAVENOUS | Status: DC
  Filled 2020-12-29: qty 25

## 2020-12-29 MED ORDER — ALBUMIN HUMAN 5 % IV SOLN
250.0000 mL | INTRAVENOUS | Status: AC | PRN
  Administered 2020-12-30: 12.5 g via INTRAVENOUS
  Filled 2020-12-29 (×2): qty 250

## 2020-12-29 MED ORDER — DOCUSATE SODIUM 100 MG PO CAPS: 200.0000 mg | ORAL_CAPSULE | Freq: Every day | ORAL | Status: DC

## 2020-12-29 MED ORDER — DEXTROSE 5 % IV SOLN
INTRAVENOUS | Status: DC | PRN
  Administered 2020-12-29: 3 g via INTRAVENOUS

## 2020-12-29 MED ORDER — 0.9 % SODIUM CHLORIDE (POUR BTL) OPTIME
TOPICAL | Status: DC | PRN
  Administered 2020-12-29: 5000 mL

## 2020-12-29 MED ORDER — ASPIRIN EC 325 MG PO TBEC
325.0000 mg | DELAYED_RELEASE_TABLET | Freq: Every day | ORAL | Status: DC
  Administered 2020-12-30 – 2020-12-31 (×2): 325 mg via ORAL
  Filled 2020-12-29 (×2): qty 1

## 2020-12-29 MED ORDER — ORAL CARE MOUTH RINSE
15.0000 mL | OROMUCOSAL | Status: DC
  Administered 2020-12-29 – 2020-12-30 (×2): 15 mL via OROMUCOSAL

## 2020-12-29 MED ORDER — MIDAZOLAM HCL 2 MG/2ML IJ SOLN
INTRAMUSCULAR | Status: AC
  Filled 2020-12-29: qty 2

## 2020-12-29 MED ORDER — ACETAMINOPHEN 650 MG RE SUPP: 650.0000 mg | Freq: Once | RECTAL | Status: DC

## 2020-12-29 MED ORDER — PANTOPRAZOLE SODIUM 40 MG PO TBEC: 40.0000 mg | DELAYED_RELEASE_TABLET | Freq: Every day | ORAL | Status: DC

## 2020-12-29 MED ORDER — ACETAMINOPHEN 160 MG/5ML PO SOLN: 1000.0000 mg | Freq: Four times a day (QID) | ORAL | Status: AC

## 2020-12-29 MED ORDER — SODIUM CHLORIDE 0.9 % IV SOLN: 250.0000 mL | INTRAVENOUS | Status: DC

## 2020-12-29 MED ORDER — HEPARIN SODIUM (PORCINE) 1000 UNIT/ML IJ SOLN
INTRAMUSCULAR | Status: DC | PRN
  Administered 2020-12-29: 44000 [IU] via INTRAVENOUS

## 2020-12-29 MED ORDER — METOPROLOL TARTRATE 5 MG/5ML IV SOLN: 2.5000 mg | INTRAVENOUS | Status: DC | PRN

## 2020-12-29 MED ORDER — ACETAMINOPHEN 160 MG/5ML PO SOLN: 650.0000 mg | Freq: Once | ORAL | Status: DC

## 2020-12-29 MED ORDER — POTASSIUM CHLORIDE 10 MEQ/50ML IV SOLN: 10.0000 meq | INTRAVENOUS | Status: AC

## 2020-12-29 MED ORDER — MIDAZOLAM HCL 2 MG/2ML IJ SOLN: 2.0000 mg | INTRAMUSCULAR | Status: DC | PRN

## 2020-12-29 MED ORDER — PROTAMINE SULFATE 10 MG/ML IV SOLN
INTRAVENOUS | Status: DC | PRN
  Administered 2020-12-29: 440 mg via INTRAVENOUS

## 2020-12-29 MED ORDER — MIDAZOLAM HCL (PF) 10 MG/2ML IJ SOLN
INTRAMUSCULAR | Status: AC
  Filled 2020-12-29: qty 2

## 2020-12-29 MED ORDER — PHENYLEPHRINE HCL-NACL 20-0.9 MG/250ML-% IV SOLN: 0.0000 ug/min | INTRAVENOUS | Status: DC

## 2020-12-29 MED ORDER — DEXMEDETOMIDINE HCL IN NACL 400 MCG/100ML IV SOLN: 0.0000 ug/kg/h | INTRAVENOUS | Status: DC

## 2020-12-29 MED ORDER — BISACODYL 5 MG PO TBEC: 5.0000 mg | DELAYED_RELEASE_TABLET | Freq: Once | ORAL | Status: DC

## 2020-12-29 MED ORDER — GABAPENTIN 250 MG/5ML PO SOLN
600.0000 mg | Freq: Two times a day (BID) | ORAL | Status: DC
  Administered 2020-12-29: 600 mg
  Filled 2020-12-29 (×3): qty 12

## 2020-12-29 MED ORDER — NITROGLYCERIN IN D5W 200-5 MCG/ML-% IV SOLN: 0.0000 ug/min | INTRAVENOUS | Status: DC

## 2020-12-29 MED ORDER — PROPOFOL 10 MG/ML IV BOLUS
INTRAVENOUS | Status: AC
  Filled 2020-12-29: qty 20

## 2020-12-29 MED ORDER — ROCURONIUM BROMIDE 10 MG/ML (PF) SYRINGE
PREFILLED_SYRINGE | INTRAVENOUS | Status: AC
  Filled 2020-12-29: qty 10

## 2020-12-29 MED ORDER — NOREPINEPHRINE 4 MG/250ML-% IV SOLN
0.0000 ug/min | INTRAVENOUS | Status: DC
  Filled 2020-12-29: qty 250

## 2020-12-29 MED ORDER — FENTANYL CITRATE (PF) 250 MCG/5ML IJ SOLN
INTRAMUSCULAR | Status: AC
  Filled 2020-12-29: qty 25

## 2020-12-29 MED ORDER — METOPROLOL TARTRATE 25 MG/10 ML ORAL SUSPENSION: 12.5000 mg | Freq: Two times a day (BID) | ORAL | Status: DC

## 2020-12-29 MED ORDER — HEMOSTATIC AGENTS (NO CHARGE) OPTIME
TOPICAL | Status: DC | PRN
  Administered 2020-12-29: 1 via TOPICAL

## 2020-12-29 MED ORDER — BISACODYL 10 MG RE SUPP
10.0000 mg | Freq: Every day | RECTAL | Status: DC
  Filled 2020-12-29: qty 1

## 2020-12-29 MED ORDER — OXYCODONE HCL 5 MG PO TABS
5.0000 mg | ORAL_TABLET | ORAL | Status: DC | PRN
  Administered 2020-12-30: 10 mg
  Administered 2020-12-30: 5 mg
  Filled 2020-12-29: qty 2
  Filled 2020-12-29: qty 1

## 2020-12-29 MED ORDER — FAMOTIDINE IN NACL 20-0.9 MG/50ML-% IV SOLN
20.0000 mg | Freq: Two times a day (BID) | INTRAVENOUS | Status: AC
  Administered 2020-12-29 (×2): 20 mg via INTRAVENOUS
  Filled 2020-12-29 (×3): qty 50

## 2020-12-29 MED ORDER — DULOXETINE HCL 60 MG PO CPEP
60.0000 mg | ORAL_CAPSULE | Freq: Every day | ORAL | Status: DC
  Administered 2020-12-30 – 2021-01-12 (×14): 60 mg via ORAL
  Filled 2020-12-29 (×15): qty 1

## 2020-12-29 MED FILL — Magnesium Sulfate Inj 50%: INTRAMUSCULAR | Qty: 10 | Status: AC

## 2020-12-29 MED FILL — Heparin Sodium (Porcine) Inj 1000 Unit/ML: INTRAMUSCULAR | Qty: 30 | Status: AC

## 2020-12-29 MED FILL — Potassium Chloride Inj 2 mEq/ML: INTRAVENOUS | Qty: 40 | Status: AC

## 2020-12-29 SURGICAL SUPPLY — 110 items
ADAPTER CARDIO PERF ANTE/RETRO (ADAPTER) ×3 IMPLANT
ATRICLIP EXCLUSION VLAA 45 (Miscellaneous) ×3 IMPLANT
BAG DECANTER FOR FLEXI CONT (MISCELLANEOUS) ×3 IMPLANT
BLADE CLIPPER SURG (BLADE) ×3 IMPLANT
BLADE STERNUM SYSTEM 6 (BLADE) ×3 IMPLANT
BLADE SURG 15 STRL LF DISP TIS (BLADE) ×2 IMPLANT
BLADE SURG 15 STRL SS (BLADE) ×3
CABLE PACING FASLOC BIEGE (MISCELLANEOUS) ×1 IMPLANT
CABLE PACING FASLOC BLUE (MISCELLANEOUS) ×1 IMPLANT
CANISTER SUCT 3000ML PPV (MISCELLANEOUS) ×3 IMPLANT
CANNULA ARTERIAL NVNT 3/8 22FR (MISCELLANEOUS) IMPLANT
CANNULA EZ GLIDE 8.0 24FR (CANNULA) ×1 IMPLANT
CANNULA GUNDRY RCSP 15FR (MISCELLANEOUS) ×1 IMPLANT
CANNULA SUMP PERICARDIAL (CANNULA) ×4 IMPLANT
CANNULA VRC MALB SNGL STG 32FR (MISCELLANEOUS) IMPLANT
CANNULA VRC MALB SNGL STG 36FR (MISCELLANEOUS) IMPLANT
CARDIOBLATE CARDIAC ABLATION (MISCELLANEOUS)
CATH ROBINSON RED A/P 18FR (CATHETERS) ×5 IMPLANT
CLAMP ISOLATOR SYNERGY LG (MISCELLANEOUS) ×1 IMPLANT
CLIP FOGARTY SPRING 6M (CLIP) IMPLANT
CLIP TI WIDE RED SMALL 6 (CLIP) ×1 IMPLANT
CNTNR URN SCR LID CUP LEK RST (MISCELLANEOUS) IMPLANT
CONN 1/2X1/2X1/2  BEN (MISCELLANEOUS) ×6
CONN 1/2X1/2X1/2 BEN (MISCELLANEOUS) ×2 IMPLANT
CONN 3/8X1/2 ST GISH (MISCELLANEOUS) ×7 IMPLANT
CONN ST 1/2X1/2  BEN (MISCELLANEOUS) ×3
CONN ST 1/2X1/2 BEN (MISCELLANEOUS) IMPLANT
CONN ST 1/4X3/8  BEN (MISCELLANEOUS) ×3
CONN ST 1/4X3/8 BEN (MISCELLANEOUS) IMPLANT
CONN Y 3/8X3/8X3/8  BEN (MISCELLANEOUS)
CONN Y 3/8X3/8X3/8 BEN (MISCELLANEOUS) IMPLANT
CONT SPEC 4OZ STRL OR WHT (MISCELLANEOUS) ×3
CONTAINER PROTECT SURGISLUSH (MISCELLANEOUS) ×3 IMPLANT
DEVICE CARDIOBLATE CARDIAC ABL (MISCELLANEOUS) IMPLANT
DEVICE EXCLUSIN ATRCLP VLAA 45 (Miscellaneous) IMPLANT
DRAPE CARDIOVASCULAR INCISE (DRAPES) ×3
DRAPE SRG 135X102X78XABS (DRAPES) ×2 IMPLANT
DRAPE WARM FLUID 44X44 (DRAPES) IMPLANT
DRSG AQUACEL AG ADV 3.5X14 (GAUZE/BANDAGES/DRESSINGS) ×1 IMPLANT
DRSG COVADERM 4X14 (GAUZE/BANDAGES/DRESSINGS) ×3 IMPLANT
ELECT CAUTERY BLADE 6.4 (BLADE) IMPLANT
ELECT REM PT RETURN 9FT ADLT (ELECTROSURGICAL) ×6
ELECTRODE REM PT RTRN 9FT ADLT (ELECTROSURGICAL) ×4 IMPLANT
FELT TEFLON 1X6 (MISCELLANEOUS) ×5 IMPLANT
GAUZE SPONGE 4X4 12PLY STRL (GAUZE/BANDAGES/DRESSINGS) ×6 IMPLANT
GAUZE SPONGE 4X4 12PLY STRL LF (GAUZE/BANDAGES/DRESSINGS) ×1 IMPLANT
GLOVE SURG SIGNA 7.5 PF LTX (GLOVE) IMPLANT
GLOVE SURG UNDER POLY LF SZ6 (GLOVE) ×4 IMPLANT
GLOVE SURG UNDER POLY LF SZ6.5 (GLOVE) ×2 IMPLANT
GOWN STRL REUS W/ TWL LRG LVL3 (GOWN DISPOSABLE) ×8 IMPLANT
GOWN STRL REUS W/TWL LRG LVL3 (GOWN DISPOSABLE) ×30
HEMOSTAT POWDER SURGIFOAM 1G (HEMOSTASIS) IMPLANT
HEMOSTAT SURGICEL 2X14 (HEMOSTASIS) IMPLANT
INSERT FOGARTY XLG (MISCELLANEOUS) IMPLANT
KIT BASIN OR (CUSTOM PROCEDURE TRAY) ×3 IMPLANT
KIT DRAINAGE VACCUM ASSIST (KITS) ×1 IMPLANT
KIT SUCTION CATH 14FR (SUCTIONS) ×3 IMPLANT
KIT TURNOVER KIT B (KITS) ×3 IMPLANT
LINE VENT (MISCELLANEOUS) ×1 IMPLANT
LOOP VESSEL SUPERMAXI WHITE (MISCELLANEOUS) ×1 IMPLANT
NS IRRIG 1000ML POUR BTL (IV SOLUTION) ×12 IMPLANT
PACK OPEN HEART (CUSTOM PROCEDURE TRAY) ×3 IMPLANT
PAD ARMBOARD 7.5X6 YLW CONV (MISCELLANEOUS) ×6 IMPLANT
POSITIONER HEAD DONUT 9IN (MISCELLANEOUS) ×3 IMPLANT
PROBE CRYO2-ABLATION MALLABLE (MISCELLANEOUS) ×1 IMPLANT
RING ANNULOFLEX SZ 30 (Ring) ×1 IMPLANT
RING TRICUSPID T34 (Prosthesis & Implant Heart) ×1 IMPLANT
SET CARDIOPLEGIA MPS 5001102 (MISCELLANEOUS) ×1 IMPLANT
SUT ETHIBOND (SUTURE) ×2 IMPLANT
SUT ETHIBOND 2 0 SH (SUTURE) ×14 IMPLANT
SUT ETHIBOND 2 0 SH 36X2 (SUTURE) ×4 IMPLANT
SUT ETHIBOND 2 0 V4 (SUTURE) IMPLANT
SUT ETHIBOND 2 0V4 GREEN (SUTURE) IMPLANT
SUT ETHIBOND 2-0 RB-1 WHT (SUTURE) ×2 IMPLANT
SUT PROLENE 3 0 SH 48 (SUTURE) ×1 IMPLANT
SUT PROLENE 3 0 SH DA (SUTURE) ×2 IMPLANT
SUT PROLENE 3 0 SH1 36 (SUTURE) ×4 IMPLANT
SUT PROLENE 4 0 RB 1 (SUTURE) ×30
SUT PROLENE 4 0 SH DA (SUTURE) ×4 IMPLANT
SUT PROLENE 4-0 RB1 .5 CRCL 36 (SUTURE) ×4 IMPLANT
SUT PROLENE 5 0 C 1 36 (SUTURE) ×6 IMPLANT
SUT PROLENE 5 0 CC1 (SUTURE) ×3 IMPLANT
SUT SILK  1 MH (SUTURE) ×9
SUT SILK 1 MH (SUTURE) ×4 IMPLANT
SUT SILK 1 TIES 10X30 (SUTURE) ×3 IMPLANT
SUT SILK 2 0 (SUTURE) ×3
SUT SILK 2 0 SH CR/8 (SUTURE) ×6 IMPLANT
SUT SILK 2-0 18XBRD TIE 12 (SUTURE) ×2 IMPLANT
SUT SILK 3 0 SH CR/8 (SUTURE) ×3 IMPLANT
SUT SILK 4 0 (SUTURE) ×3
SUT SILK 4-0 18XBRD TIE 12 (SUTURE) ×2 IMPLANT
SUT STEEL 6MS V (SUTURE) ×1 IMPLANT
SUT STEEL STERNAL CCS#1 18IN (SUTURE) ×1 IMPLANT
SUT TEM PAC WIRE 2 0 SH (SUTURE) ×12 IMPLANT
SUT VIC AB 1 CTX 27 (SUTURE) ×2 IMPLANT
SUT VIC AB 1 CTX 36 (SUTURE) ×6
SUT VIC AB 1 CTX36XBRD ANBCTR (SUTURE) IMPLANT
SUT VIC AB 2-0 CTX 27 (SUTURE) IMPLANT
SUT VIC AB 3-0 X1 27 (SUTURE) IMPLANT
SYR BULB IRRIG 60ML STRL (SYRINGE) ×2 IMPLANT
SYSTEM SAHARA CHEST DRAIN ATS (WOUND CARE) ×3 IMPLANT
TAPE CLOTH SURG 4X10 WHT LF (GAUZE/BANDAGES/DRESSINGS) ×1 IMPLANT
TAPE PAPER 2X10 WHT MICROPORE (GAUZE/BANDAGES/DRESSINGS) ×1 IMPLANT
TOWEL GREEN STERILE (TOWEL DISPOSABLE) ×3 IMPLANT
TOWEL GREEN STERILE FF (TOWEL DISPOSABLE) ×4 IMPLANT
TRAY FOLEY SLVR 16FR TEMP STAT (SET/KITS/TRAYS/PACK) ×3 IMPLANT
UNDERPAD 30X36 HEAVY ABSORB (UNDERPADS AND DIAPERS) ×3 IMPLANT
VRC MALLEABLE SINGLE STG 32FR (MISCELLANEOUS) ×3
VRC MALLEABLE SINGLE STG 36FR (MISCELLANEOUS) ×3
WATER STERILE IRR 1000ML POUR (IV SOLUTION) ×6 IMPLANT

## 2020-12-29 NOTE — Brief Op Note (Signed)
12/19/2020 - 12/29/2020  4:40 PM  PATIENT:  [name]  65 y.o. female  PRE-OPERATIVE DIAGNOSIS:  MITRAL REGURGITATION,TRICUSPID REGURGITATION, ATRIAL FIBRILLATION  POST-OPERATIVE DIAGNOSIS:  MITRAL REGURGITATION,TRICUSPID REGURGITATION, ATRIAL FIBRILLATION  PROCEDURE:  Procedure(s): MITRAL VALVE REPAIR USING CARBOMEDICS ANNULOFLEX RING SIZE 30 (N/A) 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 RING SIZE 34 (N/A) MAZE (N/A) TRANSESOPHAGEAL ECHOCARDIOGRAM (TEE) (N/A) LEFT ATRIAL APPENDAGE CLIP ATRI-CURE 45 mm Flex-V  SURGEON:  Surgeon(s) and Role:    * [name], MD - Primary  PHYSICIAN ASSISTANT:   Nicholes Rough, PA-C  ANESTHESIA:   general  EBL:  1120 mL   BLOOD ADMINISTERED:none  DRAINS: routine   LOCAL MEDICATIONS USED:   NONE  SPECIMEN:  No Specimen  DISPOSITION OF SPECIMEN:  PATHOLOGY  COUNTS:  YES  TOURNIQUET:  * No tourniquets in log *  DICTATION: .Dragon Dictation  PLAN OF CARE: Admit to inpatient   PATIENT DISPOSITION:  ICU - intubated and hemodynamically stable.   Delay start of Pharmacological VTE agent (>24hrs) due to surgical blood loss or risk of bleeding: yes  XC= 105 min CPB= 204 min- off on Milrinone 0.25, vasopressin 0.02, norepi 3, DDD at 90

## 2020-12-29 NOTE — Transfer of Care (Signed)
Immediate Anesthesia Transfer of Care Note  Patient: [name]  Procedure(s) Performed: MITRAL VALVE REPAIR USING CARBOMEDICS ANNULOFLEX RING SIZE 30 (N/A Chest) 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 RING SIZE 34 (N/A ) MAZE (N/A ) TRANSESOPHAGEAL ECHOCARDIOGRAM (TEE) (N/A )  Patient Location: ICU  Anesthesia Type:General  Level of Consciousness: sedated and Patient remains intubated per anesthesia plan  Airway & Oxygen Therapy: Patient remains intubated per anesthesia plan  Post-op Assessment: Report given to RN and Post -op Vital signs reviewed and stable  Post vital signs: Reviewed and stable  Last Vitals:  Vitals Value Taken Time  BP 102/65 12/29/20 1527  Temp 37.3 C 12/29/20 1530  Pulse    Resp 6 12/29/20 1530  SpO2 100 % 12/29/20 1530  Vitals shown include unvalidated device data.  Last Pain:  Vitals:   12/29/20 0600  TempSrc: Oral  PainSc:          Complications: No complications documented.

## 2020-12-29 NOTE — Procedures (Signed)
Extubation Procedure Note  Patient Details:   Name: [name] DOB: 12-13-1955 MRN: [id number]   Airway Documentation:    Vent end date: 12/29/20 Vent end time: 2327   Evaluation  O2 sats: stable throughout Complications: No apparent complications Patient did tolerate procedure well. Bilateral Breath Sounds: Clear,Diminished   Yes   NIF -29 VC 1.1L Positive cuff leak noted prior to extubation.  Patient placed on 4L [place].  [name] R Remona Boom 12/29/2020, 11:42 PM

## 2020-12-29 NOTE — Anesthesia Procedure Notes (Signed)
Arterial Line Insertion Start/End5/06/2021 7:55 AM, 12/29/2020 8:00 AM Performed by: Suzette Battiest, MD, Colin Benton, CRNA, CRNA  Patient location: Pre-op. Preanesthetic checklist: patient identified, IV checked, site marked, risks and benefits discussed, surgical consent, monitors and equipment checked, pre-op evaluation, timeout performed and anesthesia consent Lidocaine 1% used for infiltration and patient sedated Left, radial was placed Catheter size: 20 G Hand hygiene performed , maximum sterile barriers used  and Seldinger technique used Allen's test indicative of satisfactory collateral circulation Attempts: 2 Procedure performed without using ultrasound guided technique. Following insertion, Biopatch and dressing applied. Post procedure assessment: normal  Patient tolerated the procedure well with no immediate complications.

## 2020-12-29 NOTE — Interval H&P Note (Signed)
History and Physical Interval Note:  12/29/2020 8:03 AM  [name]  has presented today for surgery, with the diagnosis of MR TR.  The various methods of treatment have been discussed with the patient and family. After consideration of risks, benefits and other options for treatment, the patient has consented to  Procedure(s): MITRAL VALVE REPAIR OR REPLACEMENT (MVR) (N/A) TRICUSPID VALVE REPAIR (N/A) MAZE (N/A) TRANSESOPHAGEAL ECHOCARDIOGRAM (TEE) (N/A) as a surgical intervention.  The patient's history has been reviewed, patient examined, no change in status, stable for surgery.  I have reviewed the patient's chart and labs.  Questions were answered to the patient's satisfaction.     [name]

## 2020-12-29 NOTE — Anesthesia Procedure Notes (Signed)
Central Venous Catheter Insertion Performed by: Suzette Battiest, MD, anesthesiologist Start/End5/06/2021 7:50 AM, 12/29/2020 8:10 AM Patient location: Pre-op. Preanesthetic checklist: patient identified, IV checked, site marked, risks and benefits discussed, surgical consent, monitors and equipment checked, pre-op evaluation, timeout performed and anesthesia consent Position: Trendelenburg Lidocaine 1% used for infiltration and patient sedated Hand hygiene performed , maximum sterile barriers used  and Seldinger technique used Catheter size: 9 Fr Total catheter length 10. Central line and PA cath was placed.MAC introducer Swan type:thermodilution PA Cath depth:50 Procedure performed using ultrasound guided technique. Ultrasound Notes:anatomy identified, needle tip was noted to be adjacent to the nerve/plexus identified, no ultrasound evidence of intravascular and/or intraneural injection and image(s) printed for medical record Attempts: 2 Following insertion, line sutured and dressing applied. Post procedure assessment: blood return through all ports, free fluid flow and no air  Patient tolerated the procedure well with no immediate complications.

## 2020-12-29 NOTE — Progress Notes (Signed)
  Echocardiogram Echocardiogram Transesophageal has been performed.  [name] 12/29/2020, 9:54 AM

## 2020-12-29 NOTE — Progress Notes (Signed)
Heart Failure Stewardship Pharmacist Progress Note   PCP: [name], NP PCP-Cardiologist: Buford Dresser, MD    HPI:  65 yo female with a hx of chronic diastolic and systolic HF, difficult-to-control atrial fibrillation on Eliquis, HTN, CAD with nonobstructive CAD on LHC in 2019, and T2DM. She presented to the ED on 5/1 with SOB and L-sided CP found to have acute CHF exacerbation. ECHO last done on 11/26/20 revealed LVEF 45-50%, RV function mildly reduced, severe MR and TR. RHC on 5/4 revealed low CI (1.9) and elevated filling pressures (R>L) with low PAPi (1.8). PICC placed on 5/4 and started on milrinone 0.125 mcg/kg/min. Currently in OR for MVR, TVr, and MAZE procedure.  Current HF Medications (all on hold for surgery): Torsemide 40 mg BID Milrinone 0.125 mcg/kg/min  Prior to admission HF Medications: Torsemide 40 mg BID Metoprolol succinate 100 mg BID Digoxin 0.125 mg daily  Pertinent Lab Values: . Serum creatinine 2.08, BUN 35, Potassium 3.8, Sodium 137  Vital Signs: . Weight: 276 lbs (admission weight: 285 lbs) . Blood pressure: 130/60s  . Heart rate: 90s  Medication Assistance / Insurance Benefits Check: Does the patient have prescription insurance?  Yes Type of insurance plan: UHC Medicare and [place] Medicaid  Outpatient Pharmacy:  Prior to admission outpatient [facility] Is the patient willing to use [facility] at discharge? Yes Is the patient willing to transition their outpatient pharmacy to utilize a [facility] outpatient pharmacy?   Pending    Assessment: 1. Acute on chronic systolic CHF (EF 93-81%), due to NICM. NYHA class II-III symptoms. - Low cardiac index on RHC (1.9), Coox 66% this morning - on IV milrinone 0.125 mcg/kg/min. - Torsemide on hold for surgery - Can consider addition of Entresto, spironolactone, and SGLT2i pending clinical course post-procedure - Digoxin held for supratherapeutic trough level. Can consider restarting at  lower dose 0.0625 mg daily pending clinical course post-procedure - Off metoprolol given low cardiac index   Plan: 1) Medication changes recommended at this time: - No medication changes for now. Will follow-up post-procedure tomorrow.  2) Patient assistance: - None needed for now  3)  Education  - To be completed prior to discharge  Richardine Service, PharmD, BCPS Heart Failure Stewardship Pharmacist Phone [phone]

## 2020-12-29 NOTE — Anesthesia Procedure Notes (Signed)
Procedure Name: Intubation Date/Time: 12/29/2020 8:55 AM Performed by: Colin Benton, CRNA Pre-anesthesia Checklist: Patient identified, Emergency Drugs available, Suction available and Patient being monitored Patient Re-evaluated:Patient Re-evaluated prior to induction Oxygen Delivery Method: Circle System Utilized Preoxygenation: Pre-oxygenation with 100% oxygen Induction Type: IV induction Ventilation: Mask ventilation without difficulty Laryngoscope Size: Mac and 4 Grade View: Grade I Tube type: Oral Tube size: 7.5 mm Number of attempts: 1 Airway Equipment and Method: Stylet Placement Confirmation: ETT inserted through vocal cords under direct vision,  positive ETCO2 and breath sounds checked- equal and bilateral Secured at: 22 cm Tube secured with: Tape Dental Injury: Teeth and Oropharynx as per pre-operative assessment

## 2020-12-29 NOTE — Progress Notes (Signed)
      [address]       [place] [address]             [phone]      Intubated, just starting to wake up  BP (!) 143/92 (BP Location: Left Arm)   Pulse 72   Temp 98.96 F (37.2 C)   Resp (!) 24   Ht 5\' 7"  (1.702 m)   Wt 125.2 kg   SpO2 100%   BMI 43.23 kg/m  32/17 CI= 2.3 On milrinone 0.25, norepi @ 3, vasopressin 0.02   Intake/Output Summary (Last 24 hours) at 12/29/2020 1935 Last data filed at 12/29/2020 1900 Gross per 24 hour  Intake 5124.41 ml  Output 3485 ml  Net 1639.41 ml  minimal CT output  Hct= 34, K= 4.8, Cr 1.9  Doing well early postop- wean to extubate  Remo Lipps C. Roxan Hockey, MD [facility] [phone]

## 2020-12-29 NOTE — Anesthesia Preprocedure Evaluation (Addendum)
Anesthesia Evaluation  Patient identified by MRN, date of birth, ID band Patient awake    Reviewed: Allergy & Precautions, NPO status , Patient's Chart, lab work & pertinent test results  Airway Mallampati: III  TM Distance: >3 FB Neck ROM: Full    Dental  (+) Dental Advisory Given   Pulmonary shortness of breath, asthma , sleep apnea , COPD,    breath sounds clear to auscultation       Cardiovascular hypertension, Pt. on medications and Pt. on home beta blockers +CHF  + dysrhythmias Atrial Fibrillation + Valvular Problems/Murmurs MR  Rhythm:Irregular Rate:Normal     Neuro/Psych negative neurological ROS     GI/Hepatic Neg liver ROS, GERD  ,  Endo/Other  diabetes, Type 2, Insulin Dependent  Renal/GU CRFRenal disease     Musculoskeletal  (+) Arthritis ,   Abdominal   Peds  Hematology negative hematology ROS (+)   Anesthesia Other Findings   Reproductive/Obstetrics                             Lab Results  Component Value Date   WBC 8.9 12/29/2020   HGB 12.6 12/29/2020   HCT 39.4 12/29/2020   MCV 88.7 12/29/2020   PLT 222 12/29/2020   Lab Results  Component Value Date   CREATININE 2.08 (H) 12/29/2020   BUN 35 (H) 12/29/2020   NA 137 12/29/2020   K 3.8 12/29/2020   CL 98 12/29/2020   CO2 31 12/29/2020    Anesthesia Physical Anesthesia Plan  ASA: IV  Anesthesia Plan: General   Post-op Pain Management:    Induction: Intravenous  PONV Risk Score and Plan: 3 and Ondansetron, Dexamethasone and Treatment may vary due to age or medical condition  Airway Management Planned: Oral ETT  Additional Equipment: Arterial line, CVP, PA Cath, TEE and Ultrasound Guidance Line Placement  Intra-op Plan:   Post-operative Plan: Post-operative intubation/ventilation  Informed Consent: I have reviewed the patients History and Physical, chart, labs and discussed the procedure including the  risks, benefits and alternatives for the proposed anesthesia with the patient or authorized representative who has indicated his/her understanding and acceptance.     Dental advisory given  Plan Discussed with: CRNA  Anesthesia Plan Comments:         Anesthesia Quick Evaluation

## 2020-12-29 NOTE — Progress Notes (Signed)
Patient escorted to the OR. Report given at bedside to CRNA. Patient belongings at nurses station for daughter to retrieve.

## 2020-12-29 NOTE — Progress Notes (Signed)
Family Medicine Teaching Service Daily Progress Note Intern Pager: [phone]  Patient name: [name] Medical record number: [id number] Date of birth: 09/27/1955 Age: 65 y.o. Gender: female  Primary Care Provider: [name], NP Consultants: Cardiology, CVTS Code Status: FULL  Pt Overview and Major Events to Date:  5/1: Admitted 5/4: [place] 5/11: Plan for MVR/TV annuloplasty/MAZE  Assessment and Plan: [name] a 65 y.o.femalewho presented with CHF exacerbation, now planning for MV and TV replacement. PMH is significant forHFmrEF with EF 45-50%, atrial fibrillation on anticoagulation, GERD, HTN,and T2DM.  Combined systolic and diastolic CHF Exacerbation Severe Mitral and Tricuspid Regurgitation UOP2.9L in last 24 hrs.Total -18.7L since admission. Weight down9 lbs since admission. Current weight 276, dry weight 270.Crstable2.04>2.08.Electrolytes stable.CVTS to do MVR/TV annuloplasty/MAZE procedures today. Dental clearance yesterday for surgery. -To OR this morning  Hypokalemia: Resolved Recurrent in setting of aggressive diuresis. K 3.8.  Persistent Atrial Fibrillationon AC HR 80's.Plan for MAZE procedure today. -Holding home medications for surgery  T2DM with Neuropathy: Chronic, stable NPO for surgery -CBG AC/qHS -Medications on hold for surgery   HTN: Chronic, stable BP 115/68 this AM. -Holding Toprol-XL   HLD: chronic, stable -Crestor on hold for surgery  Depression: chronic, stable -Duloxetine on hold for surgery   FEN/GI:NPO for surgery PPx: Heparin gtt on hold for surgery   Status is: Inpatient  Remains inpatient appropriate because:Open heart surgery today   Dispo: The patient is from: Home              Anticipated d/c is to: Home              Patient currently is not medically stable to d/c.   Difficult to place patient No    Subjective:  No acute events overnight. Went to see patient,  she was being wheeled to OR for surgery this morning. Appeared in good spirits   Objective: Temp:  [97.6 F (36.4 C)-98.4 F (36.9 C)] 98.4 F (36.9 C) (05/11 0600) Pulse Rate:  [81-100] 86 (05/11 0600) Resp:  [16-18] 18 (05/11 0600) BP: (115-134)/(64-116) 115/68 (05/11 0600) SpO2:  [97 %-99 %] 99 % (05/11 0600) Weight:  [125.2 kg] 125.2 kg (05/11 0219) Physical Exam: General: Awake, in no apparent distress, in bed being wheeled for procedure  Laboratory: Recent Labs  Lab 12/27/20 0435 12/28/20 0451 12/29/20 0417  WBC 9.8 9.5 8.9  HGB 12.9 12.6 12.6  HCT 40.8 40.3 39.4  PLT 228 230 222   Recent Labs  Lab 12/27/20 0435 12/28/20 0451 12/29/20 0417  NA 137 134* 137  K 3.5 3.6 3.8  CL 97* 97* 98  CO2 31 29 31   BUN 30* 33* 35*  CREATININE 1.96* 2.04* 2.08*  CALCIUM 9.5 9.5 9.5  GLUCOSE 171* 162* 219*    Imaging/Diagnostic Tests: DG Chest 2 View  Result Date: 12/28/2020 CLINICAL DATA:  Preoperative assessment for cardiac surgery EXAM: CHEST - 2 VIEW COMPARISON:  Dec 23, 2020. FINDINGS: Central catheter tip in superior vena cava. No pneumothorax. Lungs are clear. There is cardiomegaly with pulmonary vascularity normal. No adenopathy. No bone lesions. IMPRESSION: Cardiomegaly. Lungs clear. Central catheter tip in superior vena cava. No pneumothorax. Electronically Signed   By: Lowella Grip III M.D.   On: 12/28/2020 13:45     Sharion Settler, DO 12/29/2020, 6:25 AM PGY-1, [facility] Intern pager: [phone], text pages welcome

## 2020-12-29 NOTE — Anesthesia Procedure Notes (Signed)
Central Venous Catheter Insertion Performed by: Suzette Battiest, MD, anesthesiologist Start/End5/06/2021 7:50 AM, 12/29/2020 8:10 AM Patient location: Pre-op. Preanesthetic checklist: patient identified, IV checked, site marked, risks and benefits discussed, surgical consent, monitors and equipment checked, pre-op evaluation, timeout performed and anesthesia consent Hand hygiene performed  and maximum sterile barriers used  PA cath was placed.Swan type:thermodilution PA Cath depth:50 Procedure performed without using ultrasound guided technique. Attempts: 1 Patient tolerated the procedure well with no immediate complications.

## 2020-12-29 NOTE — Progress Notes (Deleted)
  Echocardiogram 2D Echocardiogram has been performed.  [name] 12/29/2020, 9:53 AM

## 2020-12-30 ENCOUNTER — Inpatient Hospital Stay (HOSPITAL_COMMUNITY): Payer: Medicare Other

## 2020-12-30 ENCOUNTER — Encounter (HOSPITAL_COMMUNITY): Payer: Self-pay | Admitting: Thoracic Surgery (Cardiothoracic Vascular Surgery)

## 2020-12-30 DIAGNOSIS — Z9889 Other specified postprocedural states: Secondary | ICD-10-CM

## 2020-12-30 DIAGNOSIS — I5043 Acute on chronic combined systolic (congestive) and diastolic (congestive) heart failure: Secondary | ICD-10-CM | POA: Diagnosis not present

## 2020-12-30 LAB — POCT I-STAT 7, (LYTES, BLD GAS, ICA,H+H)
Acid-base deficit: 2 mmol/L (ref 0.0–2.0)
Bicarbonate: 23 mmol/L (ref 20.0–28.0)
Calcium, Ion: 1.2 mmol/L (ref 1.15–1.40)
HCT: 31 % — ABNORMAL LOW (ref 36.0–46.0)
Hemoglobin: 10.5 g/dL — ABNORMAL LOW (ref 12.0–15.0)
O2 Saturation: 94 %
Patient temperature: 36.3
Potassium: 4.4 mmol/L (ref 3.5–5.1)
Sodium: 141 mmol/L (ref 135–145)
TCO2: 24 mmol/L (ref 22–32)
pCO2 arterial: 39.7 mmHg (ref 32.0–48.0)
pH, Arterial: 7.368 (ref 7.350–7.450)
pO2, Arterial: 70 mmHg — ABNORMAL LOW (ref 83.0–108.0)

## 2020-12-30 LAB — GLUCOSE, CAPILLARY
Glucose-Capillary: 133 mg/dL — ABNORMAL HIGH (ref 70–99)
Glucose-Capillary: 144 mg/dL — ABNORMAL HIGH (ref 70–99)
Glucose-Capillary: 144 mg/dL — ABNORMAL HIGH (ref 70–99)
Glucose-Capillary: 144 mg/dL — ABNORMAL HIGH (ref 70–99)
Glucose-Capillary: 149 mg/dL — ABNORMAL HIGH (ref 70–99)
Glucose-Capillary: 150 mg/dL — ABNORMAL HIGH (ref 70–99)
Glucose-Capillary: 154 mg/dL — ABNORMAL HIGH (ref 70–99)
Glucose-Capillary: 155 mg/dL — ABNORMAL HIGH (ref 70–99)
Glucose-Capillary: 156 mg/dL — ABNORMAL HIGH (ref 70–99)
Glucose-Capillary: 157 mg/dL — ABNORMAL HIGH (ref 70–99)
Glucose-Capillary: 160 mg/dL — ABNORMAL HIGH (ref 70–99)
Glucose-Capillary: 160 mg/dL — ABNORMAL HIGH (ref 70–99)
Glucose-Capillary: 161 mg/dL — ABNORMAL HIGH (ref 70–99)
Glucose-Capillary: 161 mg/dL — ABNORMAL HIGH (ref 70–99)
Glucose-Capillary: 162 mg/dL — ABNORMAL HIGH (ref 70–99)
Glucose-Capillary: 163 mg/dL — ABNORMAL HIGH (ref 70–99)
Glucose-Capillary: 164 mg/dL — ABNORMAL HIGH (ref 70–99)
Glucose-Capillary: 165 mg/dL — ABNORMAL HIGH (ref 70–99)
Glucose-Capillary: 166 mg/dL — ABNORMAL HIGH (ref 70–99)
Glucose-Capillary: 174 mg/dL — ABNORMAL HIGH (ref 70–99)
Glucose-Capillary: 181 mg/dL — ABNORMAL HIGH (ref 70–99)
Glucose-Capillary: 192 mg/dL — ABNORMAL HIGH (ref 70–99)

## 2020-12-30 LAB — BASIC METABOLIC PANEL
Anion gap: 10 (ref 5–15)
Anion gap: 8 (ref 5–15)
BUN: 30 mg/dL — ABNORMAL HIGH (ref 8–23)
BUN: 32 mg/dL — ABNORMAL HIGH (ref 8–23)
CO2: 24 mmol/L (ref 22–32)
CO2: 25 mmol/L (ref 22–32)
Calcium: 8.9 mg/dL (ref 8.9–10.3)
Calcium: 9.1 mg/dL (ref 8.9–10.3)
Chloride: 102 mmol/L (ref 98–111)
Chloride: 100 mmol/L (ref 98–111)
Creatinine, Ser: 2 mg/dL — ABNORMAL HIGH (ref 0.44–1.00)
Creatinine, Ser: 2.55 mg/dL — ABNORMAL HIGH (ref 0.44–1.00)
GFR, Estimated: 27 mL/min — ABNORMAL LOW (ref >60)
GFR, Estimated: 20 mL/min — ABNORMAL LOW (ref >60)
Glucose, Bld: 161 mg/dL — ABNORMAL HIGH (ref 70–99)
Glucose, Bld: 178 mg/dL — ABNORMAL HIGH (ref 70–99)
Potassium: 4.6 mmol/L (ref 3.5–5.1)
Potassium: 4.3 mmol/L (ref 3.5–5.1)
Sodium: 136 mmol/L (ref 135–145)
Sodium: 133 mmol/L — ABNORMAL LOW (ref 135–145)

## 2020-12-30 LAB — COOXEMETRY PANEL
Carboxyhemoglobin: 1.1 % (ref 0.5–1.5)
Methemoglobin: 1.2 % (ref 0.0–1.5)
O2 Saturation: 71.5 %
Total hemoglobin: 10.2 g/dL — ABNORMAL LOW (ref 12.0–16.0)

## 2020-12-30 LAB — CBC
HCT: 31.6 % — ABNORMAL LOW (ref 36.0–46.0)
HCT: 30.9 % — ABNORMAL LOW (ref 36.0–46.0)
Hemoglobin: 10.1 g/dL — ABNORMAL LOW (ref 12.0–15.0)
Hemoglobin: 9.5 g/dL — ABNORMAL LOW (ref 12.0–15.0)
MCH: 28.5 pg (ref 26.0–34.0)
MCH: 28.3 pg (ref 26.0–34.0)
MCHC: 32 g/dL (ref 30.0–36.0)
MCHC: 30.7 g/dL (ref 30.0–36.0)
MCV: 89 fL (ref 80.0–100.0)
MCV: 92 fL (ref 80.0–100.0)
Platelets: 123 10*3/uL — ABNORMAL LOW (ref 150–400)
Platelets: 126 10*3/uL — ABNORMAL LOW (ref 150–400)
RBC: 3.55 MIL/uL — ABNORMAL LOW (ref 3.87–5.11)
RBC: 3.36 MIL/uL — ABNORMAL LOW (ref 3.87–5.11)
RDW: 14.1 % (ref 11.5–15.5)
RDW: 14.5 % (ref 11.5–15.5)
WBC: 19 10*3/uL — ABNORMAL HIGH (ref 4.0–10.5)
WBC: 21.6 10*3/uL — ABNORMAL HIGH (ref 4.0–10.5)
nRBC: 0 % (ref 0.0–0.2)
nRBC: 0 % (ref 0.0–0.2)

## 2020-12-30 LAB — MAGNESIUM
Magnesium: 3.1 mg/dL — ABNORMAL HIGH (ref 1.7–2.4)
Magnesium: 3.1 mg/dL — ABNORMAL HIGH (ref 1.7–2.4)

## 2020-12-30 MED ORDER — AMIODARONE HCL 200 MG PO TABS: 200.0000 mg | ORAL_TABLET | Freq: Every day | ORAL | Status: DC

## 2020-12-30 MED ORDER — ENOXAPARIN SODIUM 40 MG/0.4ML IJ SOSY
40.0000 mg | PREFILLED_SYRINGE | Freq: Every day | INTRAMUSCULAR | Status: DC
  Administered 2020-12-30: 40 mg via SUBCUTANEOUS
  Filled 2020-12-30: qty 0.4

## 2020-12-30 MED ORDER — SODIUM CHLORIDE 0.9% FLUSH
10.0000 mL | Freq: Two times a day (BID) | INTRAVENOUS | Status: DC
  Administered 2020-12-30: 20 mL
  Administered 2020-12-30 – 2021-01-01 (×4): 10 mL

## 2020-12-30 MED ORDER — OXYCODONE HCL 5 MG PO TABS
5.0000 mg | ORAL_TABLET | ORAL | Status: DC | PRN
  Administered 2020-12-30 – 2021-01-03 (×14): 10 mg via ORAL
  Administered 2021-01-04: 5 mg via ORAL
  Administered 2021-01-04 – 2021-01-05 (×6): 10 mg via ORAL
  Filled 2020-12-30 (×2): qty 2
  Filled 2020-12-30: qty 1
  Filled 2020-12-30 (×2): qty 2
  Filled 2020-12-30 (×2): qty 1
  Filled 2020-12-30: qty 2
  Filled 2020-12-30: qty 1
  Filled 2020-12-30 (×4): qty 2
  Filled 2020-12-30: qty 1
  Filled 2020-12-30 (×9): qty 2

## 2020-12-30 MED ORDER — SODIUM CHLORIDE 0.9% FLUSH: 10.0000 mL | INTRAVENOUS | Status: DC | PRN

## 2020-12-30 MED ORDER — DOCUSATE SODIUM 100 MG PO CAPS
200.0000 mg | ORAL_CAPSULE | Freq: Every day | ORAL | Status: DC
  Administered 2020-12-30 – 2021-01-12 (×11): 200 mg via ORAL
  Filled 2020-12-30 (×12): qty 2

## 2020-12-30 MED ORDER — AMIODARONE HCL 200 MG PO TABS
200.0000 mg | ORAL_TABLET | Freq: Every day | ORAL | Status: DC
  Administered 2020-12-30 – 2021-01-06 (×8): 200 mg via ORAL
  Filled 2020-12-30 (×8): qty 1

## 2020-12-30 MED ORDER — ROSUVASTATIN CALCIUM 5 MG PO TABS
10.0000 mg | ORAL_TABLET | Freq: Every day | ORAL | Status: DC
  Administered 2020-12-30 – 2021-01-12 (×14): 10 mg via ORAL
  Filled 2020-12-30 (×14): qty 2

## 2020-12-30 MED ORDER — PANTOPRAZOLE SODIUM 40 MG PO PACK: 40.0000 mg | PACK | Freq: Every day | ORAL | Status: DC

## 2020-12-30 MED ORDER — TRAMADOL HCL 50 MG PO TABS
50.0000 mg | ORAL_TABLET | ORAL | Status: DC | PRN
  Administered 2020-12-30: 50 mg via ORAL
  Administered 2020-12-31: 100 mg via ORAL
  Administered 2021-01-02: 50 mg via ORAL
  Administered 2021-01-03 – 2021-01-05 (×2): 100 mg via ORAL
  Administered 2021-01-05: 50 mg via ORAL
  Filled 2020-12-30 (×2): qty 2
  Filled 2020-12-30: qty 1
  Filled 2020-12-30: qty 2
  Filled 2020-12-30: qty 1

## 2020-12-30 MED ORDER — ORAL CARE MOUTH RINSE
15.0000 mL | Freq: Two times a day (BID) | OROMUCOSAL | Status: DC
  Administered 2020-12-31 – 2021-01-12 (×22): 15 mL via OROMUCOSAL

## 2020-12-30 MED ORDER — GABAPENTIN 300 MG PO CAPS
600.0000 mg | ORAL_CAPSULE | Freq: Two times a day (BID) | ORAL | Status: DC
  Administered 2020-12-30 – 2021-01-01 (×6): 600 mg via ORAL
  Filled 2020-12-30 (×7): qty 2

## 2020-12-30 MED ORDER — FUROSEMIDE 10 MG/ML IJ SOLN
40.0000 mg | Freq: Once | INTRAMUSCULAR | Status: AC
  Administered 2020-12-30: 40 mg via INTRAVENOUS
  Filled 2020-12-30: qty 4

## 2020-12-30 MED ORDER — ALBUMIN HUMAN 5 % IV SOLN
12.5000 g | Freq: Once | INTRAVENOUS | Status: AC
  Administered 2020-12-30: 12.5 g via INTRAVENOUS
  Filled 2020-12-30: qty 250

## 2020-12-30 NOTE — Progress Notes (Signed)
EVENING ROUNDS NOTE :     [address]       [place] [address]             [phone]                 1 Day Post-Op Procedure(s) (LRB): MITRAL VALVE REPAIR USING CARBOMEDICS ANNULOFLEX RING SIZE 30 (N/A) 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 RING SIZE 34 (N/A) MAZE (N/A) TRANSESOPHAGEAL ECHOCARDIOGRAM (TEE) (N/A)   Total Length of Stay:  LOS: 9 days  Events:   No events Up to chair    BP 119/61   Pulse 90   Temp (!) 97.16 F (36.2 C)   Resp 18   Ht 5\' 7"  (0.932 m)   Wt 131.8 kg   SpO2 97%   BMI 45.50 kg/m   PAP: (22-51)/(9-21) 49/14 CVP:  [9 mmHg-11 mmHg] 11 mmHg  Vent Mode: CPAP;PSV FiO2 (%):  [40 %-50 %] 40 % Set Rate:  [4 bmp-12 bmp] 4 bmp Vt Set:  [490 mL] 490 mL PEEP:  [5 cmH20] 5 cmH20 Pressure Support:  [10 cmH20] 10 cmH20 Plateau Pressure:  [0 cmH20-14 cmH20] 14 cmH20  . sodium chloride Stopped (12/30/20 1019)  . sodium chloride    . sodium chloride 20 mL/hr at 12/29/20 1533  . albumin human 12.5 g (12/30/20 0315)  .  ceFAZolin (ANCEF) IV 2 g (12/30/20 1420)  . dexmedetomidine (PRECEDEX) IV infusion 0.7 mcg/kg/hr (12/29/20 1520)  . insulin 7.5 mL/hr at 12/30/20 1400  . lactated ringers    . lactated ringers Stopped (12/30/20 0555)  . milrinone 0.125 mcg/kg/min (12/30/20 1400)  . nitroGLYCERIN Stopped (12/29/20 1520)  . norepinephrine (LEVOPHED) Adult infusion Stopped (12/30/20 0525)  . phenylephrine (NEO-SYNEPHRINE) Adult infusion    . vasopressin Stopped (12/30/20 1138)    I/O last 3 completed shifts: In: 6204.4 [P.O.:240; I.V.:4024.4; Blood:550; IV Piggyback:1390] Out: 3557 [Urine:3230; Blood:1120; Chest Tube:410]   CBC Latest Ref Rng & Units 12/30/2020 12/30/2020 12/29/2020  WBC 4.0 - 10.5 K/uL 19.0(H) - -  Hemoglobin 12.0 - 15.0 g/dL 10.1(L) 10.5(L) 10.5(L)  Hematocrit 36.0 - 46.0 % 31.6(L) 31.0(L) 31.0(L)  Platelets 150 - 400 K/uL 123(L) - -    BMP Latest Ref Rng & Units 12/30/2020 12/30/2020 12/29/2020   Glucose 70 - 99 mg/dL 161(H) - -  BUN 8 - 23 mg/dL 30(H) - -  Creatinine 0.44 - 1.00 mg/dL 2.00(H) - -  BUN/Creat Ratio 12 - 28 - - -  Sodium 135 - 145 mmol/L 136 141 140  Potassium 3.5 - 5.1 mmol/L 4.6 4.4 4.3  Chloride 98 - 111 mmol/L 102 - -  CO2 22 - 32 mmol/L 24 - -  Calcium 8.9 - 10.3 mg/dL 8.9 - -    ABG    Component Value Date/Time   PHART 7.368 12/30/2020 0050   PCO2ART 39.7 12/30/2020 0050   PO2ART 70 (L) 12/30/2020 0050   HCO3 23.0 12/30/2020 0050   TCO2 24 12/30/2020 0050   ACIDBASEDEF 2.0 12/30/2020 0050   O2SAT 71.5 12/30/2020 0518       Arilyn Brierley, MD 12/30/2020 3:26 PM

## 2020-12-30 NOTE — Progress Notes (Signed)
1 Day Post-Op Procedure(s) (LRB): MITRAL VALVE REPAIR USING CARBOMEDICS ANNULOFLEX RING SIZE 30 (N/A) 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 RING SIZE 34 (N/A) MAZE (N/A) TRANSESOPHAGEAL ECHOCARDIOGRAM (TEE) (N/A) Subjective: C/ incisional pain, some nausea earlier  Objective: Vital signs in last 24 hours: Temp:  [97 F (36.1 C)-99.14 F (37.3 C)] 97.16 F (36.2 C) (05/12 0745) Pulse Rate:  [72-90] 90 (05/11 2233) Cardiac Rhythm: A-V Sequential paced (05/12 0400) Resp:  [16-24] 18 (05/12 0400) BP: (102-143)/(66-92) 136/78 (05/12 0700) SpO2:  [94 %-100 %] 98 % (05/12 0745) Arterial Line BP: (75-222)/(47-213) 125/47 (05/12 0745) FiO2 (%):  [40 %-50 %] 40 % (05/11 2252) Weight:  [131.8 kg] 131.8 kg (05/12 0545)  Hemodynamic parameters for last 24 hours: PAP: (22-51)/(9-21) 43/9 CVP:  [9 mmHg-11 mmHg] 11 mmHg  Intake/Output from previous day: 05/11 0701 - 05/12 0700 In: 5934 [I.V.:3994; Blood:550; IV Piggyback:1390] Out: 2683 [Urine:1830; Blood:1120; Chest Tube:390] Intake/Output this shift: No intake/output data recorded.  General appearance: alert, cooperative and no distress Neurologic: intact Heart: regular rate and rhythm Lungs: diminished breath sounds bibasilar Abdomen: normal findings: soft, non-tender  Lab Results: Recent Labs    12/29/20 2117 12/29/20 2318 12/30/20 0050 12/30/20 0414  WBC 17.9*  --   --  19.0*  HGB 10.9*   < > 10.5* 10.1*  HCT 34.7*   < > 31.0* 31.6*  PLT 142*  --   --  123*   < > = values in this interval not displayed.   BMET:  Recent Labs    12/29/20 2117 12/29/20 2318 12/30/20 0050 12/30/20 0414  NA 138   < > 141 136  K 4.8   < > 4.4 4.6  CL 107  --   --  102  CO2 24  --   --  24  GLUCOSE 157*  --   --  161*  BUN 30*  --   --  30*  CREATININE 1.94*  --   --  2.00*  CALCIUM 8.7*  --   --  8.9   < > = values in this interval not displayed.    PT/INR:  Recent Labs    12/29/20 1547  LABPROT 21.6*  INR 1.9*    ABG    Component Value Date/Time   PHART 7.368 12/30/2020 0050   HCO3 23.0 12/30/2020 0050   TCO2 24 12/30/2020 0050   ACIDBASEDEF 2.0 12/30/2020 0050   O2SAT 71.5 12/30/2020 0518   CBG (last 3)  Recent Labs    12/30/20 0210 12/30/20 0314 12/30/20 0413  GLUCAP 164* 155* 156*    Assessment/Plan: S/P Procedure(s) (LRB): MITRAL VALVE REPAIR USING CARBOMEDICS ANNULOFLEX RING SIZE 30 (N/A) 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 RING SIZE 34 (N/A) MAZE (N/A) TRANSESOPHAGEAL ECHOCARDIOGRAM (TEE) (N/A) POD # 1 CV- hemodynamics stable, CI > 3, co-ox 71  Wean vasopressin, continue Milrinone today  DC Swan and A line  In SR with 1st degree block- hold beta blocker RESP_ IS for atelectasis RENAL- creatinine 2.0 about baseline, will likely rise over next 24-48 hours  Lytes OK  Albumin, followed by Lasix to stimulate diuresis ENDO- CBG elevated, still on 8U insulin/ hour- continue IV insulin for now Anemia secondary to ABL- follow Dc chest tubes Mobilize  LOS: 9 days    [name] 12/30/2020

## 2020-12-30 NOTE — Progress Notes (Addendum)
Progress Note  Patient Name: [name] Date of Encounter: 12/30/2020  Primary Cardiologist: Buford Dresser, MD   Subjective   S/p Mitral annuloplasty, tricuspid annuloplasty, MAZE, and LAA clip. Early extubation 12/29/20.  Patient notes that she is feeling better (no palpitations) but notes significant post surgical pain.  Inpatient Medications    Scheduled Meds: . acetaminophen  1,000 mg Oral Q6H   Or  . acetaminophen (TYLENOL) oral liquid 160 mg/5 mL  1,000 mg Per Tube Q6H  . acetaminophen (TYLENOL) oral liquid 160 mg/5 mL  650 mg Per Tube Once   Or  . acetaminophen  650 mg Rectal Once  . aspirin EC  325 mg Oral Daily   Or  . aspirin  324 mg Per Tube Daily  . bisacodyl  10 mg Oral Daily   Or  . bisacodyl  10 mg Rectal Daily  . chlorhexidine gluconate (MEDLINE KIT)  15 mL Mouth Rinse BID  . Chlorhexidine Gluconate Cloth  6 each Topical Daily  . docusate  200 mg Per Tube Daily  . DULoxetine  60 mg Oral Daily  . gabapentin  600 mg Per Tube BID  . mouth rinse  15 mL Mouth Rinse BID  . metoprolol tartrate  12.5 mg Oral BID   Or  . metoprolol tartrate  12.5 mg Per Tube BID  . pantoprazole  40 mg Oral Daily  . rosuvastatin  10 mg Per Tube Daily  . sodium chloride flush  10-40 mL Intracatheter Q12H  . sodium chloride flush  3 mL Intravenous Q12H   Continuous Infusions: . sodium chloride 20 mL/hr at 12/30/20 0700  . sodium chloride    . sodium chloride 20 mL/hr at 12/29/20 1533  . albumin human 12.5 g (12/30/20 0315)  .  ceFAZolin (ANCEF) IV Stopped (12/30/20 0545)  . dexmedetomidine (PRECEDEX) IV infusion 0.7 mcg/kg/hr (12/29/20 1520)  . insulin 5 mL/hr at 12/30/20 0700  . lactated ringers    . lactated ringers Stopped (12/30/20 0555)  . milrinone 0.125 mcg/kg/min (12/30/20 0700)  . nitroGLYCERIN Stopped (12/29/20 1520)  . norepinephrine (LEVOPHED) Adult infusion Stopped (12/30/20 0525)  . phenylephrine (NEO-SYNEPHRINE) Adult infusion    .  vasopressin 0.03 Units/min (12/30/20 0700)   PRN Meds: sodium chloride, albumin human, dextrose, metoprolol tartrate, midazolam, morphine injection, ondansetron (ZOFRAN) IV, oxyCODONE, sodium chloride flush, sodium chloride flush, traMADol   Vital Signs    Vitals:   12/30/20 0500 12/30/20 0545 12/30/20 0600 12/30/20 0700  BP: 132/78  131/77   Pulse:      Resp:      Temp: (!) 97.3 F (36.3 C)  (!) 97.5 F (36.4 C) (!) 97.34 F (36.3 C)  TempSrc:      SpO2: 100%  100% 99%  Weight:  131.8 kg    Height:        Intake/Output Summary (Last 24 hours) at 12/30/2020 0740 Last data filed at 12/30/2020 0700 Gross per 24 hour  Intake 5933.97 ml  Output 3340 ml  Net 2593.97 ml   Filed Weights   12/28/20 0547 12/29/20 0219 12/30/20 0545  Weight: 125.4 kg 125.2 kg 131.8 kg    Telemetry    AS VP, SR with 1st HB, and presently junctional rhythm (V Pacing) - Personally Reviewed  ECG    Sinus rhythm with 1st degree A-V block (PR 300 ms) - Personally Reviewed  Physical Exam   GEN: Somnolent but rousable Neck: R IJ Swan without hematoma Cardiac: regular rhythm,  Distant heart sounds Respiratory:  Clear to ausculation but with poor air movement GI: Soft, nontender, non-distended  MS: No edema; No deformity.  Neuro:  Nonfocal  Psych: Normal affect   Labs    Chemistry Recent Labs  Lab 12/29/20 0417 12/29/20 0917 12/29/20 1424 12/29/20 1548 12/29/20 2117 12/29/20 2318 12/30/20 0050 12/30/20 0414  NA 137   < > 139   < > 138 140 141 136  K 3.8   < > 4.8   < > 4.8 4.3 4.4 4.6  CL 98   < > 103  --  107  --   --  102  CO2 31  --   --   --  24  --   --  24  GLUCOSE 219*   < > 179*  --  157*  --   --  161*  BUN 35*   < > 34*  --  30*  --   --  30*  CREATININE 2.08*   < > 1.90*  --  1.94*  --   --  2.00*  CALCIUM 9.5  --   --   --  8.7*  --   --  8.9  GFRNONAA 26*  --   --   --  28*  --   --  27*  ANIONGAP 8  --   --   --  7  --   --  10   < > = values in this interval not  displayed.     Hematology Recent Labs  Lab 12/29/20 1547 12/29/20 1548 12/29/20 2117 12/29/20 2318 12/30/20 0050 12/30/20 0414  WBC 21.8*  --  17.9*  --   --  19.0*  RBC 3.89  --  3.88  --   --  3.55*  HGB 11.0*   < > 10.9* 10.5* 10.5* 10.1*  HCT 34.6*   < > 34.7* 31.0* 31.0* 31.6*  MCV 88.9  --  89.4  --   --  89.0  MCH 28.3  --  28.1  --   --  28.5  MCHC 31.8  --  31.4  --   --  32.0  RDW 13.8  --  14.0  --   --  14.1  PLT 148*  --  142*  --   --  123*   < > = values in this interval not displayed.    Cardiac EnzymesNo results for input(s): TROPONINI in the last 168 hours. No results for input(s): TROPIPOC in the last 168 hours.   BNPNo results for input(s): BNP, PROBNP in the last 168 hours.   DDimer No results for input(s): DDIMER in the last 168 hours.   Radiology    DG Chest 2 View  Result Date: 12/28/2020 CLINICAL DATA:  Preoperative assessment for cardiac surgery EXAM: CHEST - 2 VIEW COMPARISON:  Dec 23, 2020. FINDINGS: Central catheter tip in superior vena cava. No pneumothorax. Lungs are clear. There is cardiomegaly with pulmonary vascularity normal. No adenopathy. No bone lesions. IMPRESSION: Cardiomegaly. Lungs clear. Central catheter tip in superior vena cava. No pneumothorax. Electronically Signed   By: Lowella Grip III M.D.   On: 12/28/2020 13:45   DG Chest Port 1 View  Result Date: 12/29/2020 CLINICAL DATA:  Hypoxia.  Status post tricuspid valve repair EXAM: PORTABLE CHEST 1 VIEW COMPARISON:  Dec 28, 2020 FINDINGS: Endotracheal tube tip is 4.4 cm above the carina. Nasogastric tube tip is below the diaphragm. Swan-Ganz catheter tip is in the proximal right main pulmonary artery. There  are mediastinal drains bilaterally. Temporary pacemaker wires are attached to the right heart. No pneumothorax. Patient is status post tricuspid valve replacement. There is a left atrial appendage clamp. There is no edema or airspace opacity. There is left perihilar atelectasis  with questionable superimposed postoperative hemorrhage. Heart is mildly enlarged with pulmonary vascularity normal. No adenopathy. No bone lesions IMPRESSION: Tube and catheter positions as described without pneumothorax. Apparent atelectasis in the left perihilar region. A degree of superimposed hemorrhage in this area postoperative cannot be excluded. Lungs otherwise clear. Stable cardiac prominence. Postoperative changes noted. Electronically Signed   By: Lowella Grip III M.D.   On: 12/29/2020 16:13   ECHO INTRAOPERATIVE TEE  Result Date: 12/29/2020  *INTRAOPERATIVE TRANSESOPHAGEAL REPORT *  Patient Name:   ANALIYAH LECHUGA Date of Exam: 12/29/2020 Medical Rec #:  [id number]            Height:       67.0 in Accession #:    [id number]           Weight:       276.0 lb Date of Birth:  08-25-1955            BSA:          2.32 m Patient Age:    65 years             BP:           115/68 mmHg Patient Gender: F                    HR:           86 bpm. Exam Location:  Anesthesiology Transesophogeal exam was perform intraoperatively during surgical procedure. Patient was closely monitored under general anesthesia during the entirety of examination. Indications:     Mitral Valve Disease, Tricuspid Valve Disease Sonographer:     Bernadene Person RDCS Performing Phys: [place] Diagnosing Phys: Suzette Battiest MD Complications: No known complications during this procedure. POST-OP IMPRESSIONS - Left Ventricle: The left ventricle is unchanged from pre-bypass. - Right Ventricle: The right ventricle appears unchanged from pre-bypass. - Aorta: The aorta appears unchanged from pre-bypass. - Left Atrium: The left atrium appears unchanged from pre-bypass. - Left Atrial Appendage: The left atrial appendage appears unchanged from pre-bypass. - Aortic Valve: The aortic valve appears unchanged from pre-bypass. - Mitral Valve: No stenosis present. There is trace regurgitation. The mitral valve is status post  repair with an annuloplasty ring. - Tricuspid Valve: No stenosis present. There is moderate regurgitation. - Pulmonic Valve: The pulmonic valve appears unchanged from pre-bypass. - Interatrial Septum: The interatrial septum appears unchanged from pre-bypass. - Interventricular Septum: The interventricular septum appears unchanged from pre-bypass. - Pericardium: The pericardium appears unchanged from pre-bypass. PRE-OP FINDINGS  Left Ventricle: The left ventricle has mildly reduced systolic function, with an ejection fraction of 45-50%. The cavity size was mildly dilated. There is concentric left ventricular hypertrophy. Right Ventricle: The right ventricle has mildly reduced systolic function. The cavity was dialated. There is no increase in right ventricular wall thickness. Left Atrium: Left atrial size was dilated. No left atrial/left atrial appendage thrombus was detected. Right Atrium: Right atrial size was dilated. Interatrial Septum: No atrial level shunt detected by color flow Doppler. Pericardium: There is no evidence of pericardial effusion. Mitral Valve: The mitral valve is normal in structure. Mitral valve regurgitation is moderate by color flow Doppler. The MR jet is posteriorly-directed. There is No evidence of mitral stenosis.  There is mild thickening present. Mildly restricted posterior leaflet. MV annulus measures 3.44cm. Tricuspid Valve: The tricuspid valve was normal in structure. Tricuspid valve regurgitation is severe by color flow Doppler. The jet is directed toward the atrial septum. No evidence of tricuspid stenosis is present. The tricuspid valve is status post repair with an annuloplasty ring. Aortic Valve: The aortic valve is tricuspid Aortic valve regurgitation is trivial by color flow Doppler. There is no stenosis of the aortic valve. Pulmonic Valve: The pulmonic valve was normal in structure. Pulmonic valve regurgitation is not visualized by color flow Doppler. Aorta: The aortic root,  ascending aorta and aortic arch are normal in size and structure.  Suzette Battiest MD Electronically signed by Suzette Battiest MD Signature Date/Time: 12/29/2020/3:38:48 PM    Final     Cardiac Studies   Transthoracic Echocardiogram: Date: 07/08/20 Results: Eccentric MR; at least moderate  1. Left ventricular ejection fraction, by estimation, is 45 to 50%. The  left ventricle has mildly decreased function. The left ventricle  demonstrates global hypokinesis. Left ventricular diastolic function could  not be evaluated.   2. Right ventricular systolic function was not well visualized. The right  ventricular size is moderately enlarged. There is normal pulmonary artery  systolic pressure.   3. Left atrial size was mildly dilated.   4. Right atrial size was moderately dilated.   5. Eccentric MR jet; appears mild-moderate on parasternal views, but on  apical views (see images 52, 66, 72) extends into distal atrium and may  have Coanda effect. Incompletely visualized and PV not sampled, suspect  moderate to severe MR.. The mitral  valve is rheumatic. Moderate to severe mitral valve regurgitation.   6. Tricuspid valve regurgitation is severe.   7. The aortic valve is tricuspid. Aortic valve regurgitation is not  visualized. No aortic stenosis is present.   8. The inferior vena cava is normal in size with <50% respiratory  variability, suggesting right atrial pressure of 8 mmHg.   Transesophageal Echocardiogram: Date: 11/26/20 Results: Severe MR and TR  1. Left ventricular ejection fraction, by estimation, is 45 to 50%. The  left ventricle has mildly decreased function. The left ventricle  demonstrates global hypokinesis.   2. Right ventricular systolic function is moderately reduced. The right  ventricular size is moderately enlarged.   3. Left atrial size was severely dilated. No left atrial/left atrial  appendage thrombus was detected.   4. Right atrial size was severely dilated.    5. MR more appreciable early in the study when blood pressure was normal.  MR jet appears less later in the study, likely due to reduction in blood  pressure. Posterior leaflet appears mildly restricted, with override of  anterior leaflet. Systolic flow  reversal seen in right sided pulmonary veins.. The mitral valve is  abnormal. Severe mitral valve regurgitation. The mean mitral valve  gradient is 2.0 mmHg.   6. Tricuspid valve regurgitation is severe.   7. The aortic valve is tricuspid. Aortic valve regurgitation is not  visualized. No aortic stenosis is present.   NonCardiac CT : Date: 01/15/20 Results: Aortic Atherosclerosis mPA dilation 33 mm CS dilation 15 mm  Left/Right Heart Catheterizations: Date: 12/22/20 Results: 1. Elevated R > L heart filling pressures with PAPi low but not markedly so (1.8) suggestive of a significant component of RV dysfunction.  2. Prominent v-waves in PCWP and RA pressure tracings suggestive of significant MR and TR.  3. Cardiac index low.   Date: 12/31/2017 Results: minimal non-obstructive  CAD  Prox LAD lesion is 10% stenosed.  Dist LM to Ost LAD lesion is 10% stenosed.  Ost Cx lesion is 15% stenosed.  The left ventricular systolic function is normal.  LV end diastolic pressure is normal.    Patient Profile     65 y.o. female with a history of morbid obesity, Severe TR, severe MR, HFmrEF, Morbid Obesity DM/HTN/HLd, and Longstanding persistent AF who presented with HF Decompensation  Assessment & Plan    Severe TR s/p annuloplasty Severe MR s/p annuloplasty Heart Failure mildly reduced Ejection Fraction  - NYHA class III, Stage D, hypervolemic, etiology from AF likely - Diuretic regimen: held; PA diastolic of 8 today (1/63/84) - BB metoprolol 12.5 mg PO BID; will need succinate transition peri-discharge - ARNI/ARB/ACEi/MRA/SGLT2i have been not started in the setting of CKD IV  Will reach out to CT Surgery about milrinone/vasopressin  wean today and PM venous co-ox  Longstanding Persistent Atrial Fibrillation - CHADSVASC 4+ (vascular disease not included in her assessment) on [facility] - ASA 324 per [facility]CTS - dig held 12/29/20 in the setting of dig level of 1 and peri surgery; will check dig level in AM of 12/31/20) - 4.8 g of amiodarone and s/p MAZE- will transition to continue 200 mg PO Daily - will need TEE as outpatient to confirm LAA-exclusion Morbid Obesity/DM/HTN DM with HLD Aortic Atherosclerosis - continue rosuvastatin 10 mg PO Daily; LDL last < 70 in 2019  Discussed with nursing  For questions or updates, please contact [facility] Please consult [email or web address] for contact info under Cardiology/STEMI.      Signed, Werner Lean, MD  12/30/2020, 7:40 AM    CRITICAL CARE Performed by: Zully Frane A Shavonna Corella  Total critical care time: 35 minutes. Critical care time was exclusive of separately billable procedures and treating other patients. Critical care was necessary to treat or prevent imminent or life-threatening deterioration. Critical care was time spent personally by me on the following activities: development of treatment plan with patient and/or surrogate as well as nursing, discussions with consultants, evaluation of patient's response to treatment, examination of patient, obtaining history from patient or surrogate, ordering and performing treatments and interventions, ordering and review of laboratory studies, ordering and review of radiographic studies, pulse oximetry and re-evaluation of patient's condition.    Signed, Rudean Haskell, [place]  12/30/2020 8:19 AM

## 2020-12-30 NOTE — Progress Notes (Addendum)
Hendrickson,MD made aware that patient met all extubation parameters but did not have a cuff leak.  Verbal order to proceed with extubation

## 2020-12-30 NOTE — Op Note (Signed)
[name], [name] MEDICAL RECORD NO: [id number] ACCOUNT NO: [id number] DATE OF BIRTH: 1956-02-04 FACILITY: [facility] LOCATION: [facility] PHYSICIAN: Revonda Standard. Roxan Hockey, MD  Operative Report   DATE OF PROCEDURE: 12/29/2020  PREOPERATIVE DIAGNOSES:  Severe mitral and tricuspid regurgitation and persistent atrial fibrillation.  POSTOPERATIVE DIAGNOSES:  Moderately severe mitral regurgitation, severe tricuspid regurgitation, persistent atrial fibrillation.  PROCEDURES:   Median sternotomy; extracorporeal circulation; Mitral valve annuloplasty with Carbomedics AnnuloFlex ring, reference number [id number], 30 mm, serial number [id number]; Tricuspid valve annuloplasty with MC3 annuloplasty ring, model number 4900,  34 mm, serial number [id number]; Left- and right-sided maze procedure using radiofrequency ablation and cryo; and Left atrial appendage clip with AtriClip, 45 mm, Flex-V, lot number [id number].  SURGEON:  Revonda Standard. Roxan Hockey, MD  ASSISTANT:  Nicholes Rough, PA-C  ANESTHESIA:  General.  FINDINGS:  Pre-bypass transesophageal echocardiography performed by Dr. Suzette Battiest revealed moderately severe mitral regurgitation and severe tricuspid regurgitation.  Post-bypass both significantly improved, some residual tricuspid regurgitation,  likely due to Swan-Ganz catheter.  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  She was  admitted with decompensated heart failure.  She was aggressively diuresed, and she was referred for mitral and tricuspid valve repair.  The indications, risks, benefits, and alternatives were discussed in detail with the patient.  She understood and  accepted the risks and agreed to proceed.   OPERATIVE NOTE:  [name] was brought to the preoperative holding area on  12/29/2020.  She had placement of a Swan-Ganz catheter and an arterial blood pressure monitoring line.  She was taken to the operating room, anesthetized, and intubated.  Foley  catheter was placed.  Intravenous antibiotics were administered.  Transesophageal echocardiography was performed by Dr. Suzette Battiest with consultation from Dr. Roberts Gaudy.  Initially, with the heart unloaded and the systolic pressure in the 06T  and the patient on milrinone, there was rather mild mitral regurgitation; however, with challenging by increasing afterload, the mitral regurgitation did worsen significantly.  Tricuspid regurgitation was severe as noted on the preoperative echo.   Decision was made to proceed with surgical repair of the valves given the patient's recurrent admissions.  The chest, abdomen, and legs were prepped and draped in the usual sterile fashion.  A timeout was performed.  A median sternotomy was performed.  Initial hemostasis was obtained.  The sternal retractor was placed and was gently opened over time.  The pericardium was opened.  The patient was fully heparinized.  After confirming adequate  anticoagulation with ACT measurement, the aorta was cannulated via concentric 2-0 Ethibond pledgeted pursestring sutures.  The right atrium was markedly enlarged and the wall was thickened.  The superior vena cava was markedly enlarged as well.  A  32-French malleable venous cannula was placed via pursestring suture in the superior vena cava.  Cardiopulmonary bypass was initiated.  A 36-French malleable cannula then was placed via pursestring suture in the inferior aspect of the right atrium and  directed into the inferior vena cava.  Flows were maintained per protocol.  Anticoagulation was monitored with ACT measurement.  The heart was elevated exposing the left atrial appendage.  Two ablation lines were made at the base of the left atrial  appendage using bipolar radiofrequency ablation with the  AtriCure device.  Transmurality was achieved with all applications of the radiofrequency device.  The left-sided pulmonary veins then were  encircled.  The bipolar device was placed across the left  atrium near the confluence of the 2 veins and 2 bipolar ablation lines were placed there as well.  A retrograde cardioplegic cannula was placed via pursestring suture in the right atrium and directed into the coronary sinus.  An antegrade cardioplegia  cannula was placed into the ascending aorta.  The aorta was cross clamped.  The left ventricle was emptied via the aortic root vent.  Cardiac arrest then was achieved with a combination of cold antegrade and retrograde blood cardioplegia and topical iced saline.  An additional 700 mL of cardioplegia  was administered antegrade.  There was a rapid diastolic arrest.  An additional 1300 mL of cardioplegia was administered retrograde, achieved subsequent cooling to less than 10 degrees Celsius.  Additional cardioplegia was administered at 18- to  20-minute intervals throughout the crossclamp portion of the procedure.  The interatrial groove was dissected out and the left atrium was opened.  The left atrium was markedly enlarged.  Exposure of the mitral annulus was difficult initially.  Part of  the pulmonary vein box lesion was performed using the bipolar radiofrequency device superior and inferior to the right-sided pulmonary veins.  Lesion connecting the box to the base of the atrial appendage, the 4 o'clock position on the mitral  annulus and connecting the right-sided pulmonary vein isolation lines were performed with a cryoprobe.  Cryoprobe was taken to -70 degrees Celsius for 2 minutes with each ablation line.  The mitral retractor was adjusted, and the mitral valve annulus was  visualized.  There was some mild tethering of P3, but otherwise the leaflets appeared relatively normal.  There was annular dilatation.  Two Ethibond horizontal mattress sutures were  placed circumferentially around the annulus.  Sizing of the anterior  leaflet and the commissures both sized for a 30 mm Carbomedics AnnuloFlex valve.  Sutures were passed through the sewing ring of the annuloplasty ring, which was lowered into place and the sutures were sequentially tied.  The left ventricle was distended  with iced saline, and there was a trace leak from the posterior medial commissure.  This was not felt to be clinically significant.  A vent was left in the left ventricle to assist with de-airing, and the left atriotomy was closed in 2 layers with  running 4-0 Prolene sutures.  After completion of the first layer before tying the suture, de-airing was performed and the vent was removed.  The second layer of suture then was placed.  While awaiting the hot shot to be rewarmed, the tip of the right  atrial appendage was removed, and a right atriotomy was performed.  The bipolar device was used to connect the atriotomy to the appendage and the superior and inferior vena cava.  Again, 2 parallel ablation lines were placed.  Transmurality confirmed.   The cryoprobe was used to create lesions from the atrial appendage to the tricuspid annulus and from the atriotomy to the coronary sinus.  At this point, the warm dose of retrograde cardioplegia was administered.  The patient was placed in Trendelenburg  position.  The aortic crossclamp was removed.  Total crossclamp time was 105 minutes.  Retractor was placed exposing the tricuspid valve.  The Swan-Ganz catheter was pulled out of the atrium.  2-0 Ethibond horizontal mattress sutures were placed around the annulus.  The  annulus sized for a 34 mm Edwards MC3 annuloplasty ring.  The sutures were passed through the sewing cuff of the ring, which  was then lowered into place, and the sutures were sequentially tied.  The Swan-Ganz catheter was passed back through the  tricuspid valve into the right ventricle and palpated in the pulmonary artery.  Closure of the right atriotomy was performed with a running 4-0 Prolene suture.  Right atrium then was deaired through the tip of the appendage, which was oversewn with 4-0  Prolene suture.  The patient had regained a rhythm, which appeared to be junctional with a rate of around 58 during the tricuspid repair.  The heart was elevated.  The left atrial appendage had been sized for a 45 mm Flex-V clip that was placed without  difficulty.  By this point, the patient had rewarmed to a core temperature was 37 degrees Celsius.  Epicardial pacing wires were placed on the right ventricle and right atrium, and DDD pacing was initiated initially at 80 beats per minute and then  increased to 90 beats per minute.  Trial wean showed trace mitral regurg at the site noted previously.  There was still mild to moderate tricuspid regurgitation, but likely due to the Swan-Ganz catheter.  The patient was placed back on full support.  The superior vena caval  venous cannula was repositioned into the right atrium.  The inferior vena caval cannula was removed.  An additional 4-0 Prolene suture was used for hemostasis at that site.  The retrograde cardioplegia cannula was removed, and the suture was tied.  With  the patient rewarmed, the milrinone infusion was increased to 0.25 mcg/kg/min and vasopressin was initiated at 0.02 mcg/min and then the norepinephrine infusion was initiated at 3 mcg/min.  The patient then was weaned from cardiopulmonary bypass on the  first attempt.  The cardiac indexes with the Swan-Ganz catheter were unreliable.  The curves were poor.  It was unclear if this was positioning of the catheter or some other issue, but that had actually been the case since it had been placed  initially.  The other hemodynamic parameters were good, so we proceeded with a test dose of protamine that was well tolerated.  The atrial and the aortic cannulae were removed.  The aortic root vent was removed.  The remainder of the  protamine was  administered.  There was a transient drop in blood pressure that responded appropriately to volume resuscitation.  The patient then remained hemodynamically stable throughout the post-bypass period.  The chest was copiously irrigated with warm saline.  Hemostasis was achieved.  The pericardium was reapproximated over the ascending aorta and base of the heart with interrupted 3-0 silk sutures.  It came together easily without tension.  A 28-French  Blake drain and 36-French chest tube were placed through separate subcostal incisions.  The Blake drain was placed in the posterior pericardium.  The chest tube was placed in the anterior mediastinum.  The sternum was closed with a combination of single  and double heavy gauge stainless steel wires.  The pectoralis fascia, subcutaneous tissue, and skin were closed in standard fashion.  All sponge, needle, and instrument counts were correct at the end of the procedure.  The patient was taken from the  operating room to the surgical intensive care unit intubated and in fair condition.   ROH D: 12/29/2020 5:02:05 pm T: 12/30/2020 11:33:00 am  JOB: [id number]/ [id number]

## 2020-12-31 ENCOUNTER — Inpatient Hospital Stay (HOSPITAL_COMMUNITY): Payer: Medicare Other

## 2020-12-31 DIAGNOSIS — I5043 Acute on chronic combined systolic (congestive) and diastolic (congestive) heart failure: Secondary | ICD-10-CM | POA: Diagnosis not present

## 2020-12-31 DIAGNOSIS — Z9889 Other specified postprocedural states: Secondary | ICD-10-CM | POA: Diagnosis not present

## 2020-12-31 DIAGNOSIS — I4821 Permanent atrial fibrillation: Secondary | ICD-10-CM | POA: Diagnosis not present

## 2020-12-31 LAB — BASIC METABOLIC PANEL
Anion gap: 10 (ref 5–15)
BUN: 35 mg/dL — ABNORMAL HIGH (ref 8–23)
CO2: 25 mmol/L (ref 22–32)
Calcium: 9.4 mg/dL (ref 8.9–10.3)
Chloride: 99 mmol/L (ref 98–111)
Creatinine, Ser: 3.03 mg/dL — ABNORMAL HIGH (ref 0.44–1.00)
GFR, Estimated: 17 mL/min — ABNORMAL LOW (ref >60)
Glucose, Bld: 127 mg/dL — ABNORMAL HIGH (ref 70–99)
Potassium: 4.3 mmol/L (ref 3.5–5.1)
Sodium: 134 mmol/L — ABNORMAL LOW (ref 135–145)

## 2020-12-31 LAB — GLUCOSE, CAPILLARY
Glucose-Capillary: 109 mg/dL — ABNORMAL HIGH (ref 70–99)
Glucose-Capillary: 111 mg/dL — ABNORMAL HIGH (ref 70–99)
Glucose-Capillary: 129 mg/dL — ABNORMAL HIGH (ref 70–99)
Glucose-Capillary: 131 mg/dL — ABNORMAL HIGH (ref 70–99)
Glucose-Capillary: 131 mg/dL — ABNORMAL HIGH (ref 70–99)
Glucose-Capillary: 131 mg/dL — ABNORMAL HIGH (ref 70–99)
Glucose-Capillary: 152 mg/dL — ABNORMAL HIGH (ref 70–99)
Glucose-Capillary: 157 mg/dL — ABNORMAL HIGH (ref 70–99)
Glucose-Capillary: 170 mg/dL — ABNORMAL HIGH (ref 70–99)
Glucose-Capillary: 192 mg/dL — ABNORMAL HIGH (ref 70–99)
Glucose-Capillary: 216 mg/dL — ABNORMAL HIGH (ref 70–99)

## 2020-12-31 LAB — CBC
HCT: 29.4 % — ABNORMAL LOW (ref 36.0–46.0)
Hemoglobin: 9.3 g/dL — ABNORMAL LOW (ref 12.0–15.0)
MCH: 28.8 pg (ref 26.0–34.0)
MCHC: 31.6 g/dL (ref 30.0–36.0)
MCV: 91 fL (ref 80.0–100.0)
Platelets: 120 10*3/uL — ABNORMAL LOW (ref 150–400)
RBC: 3.23 MIL/uL — ABNORMAL LOW (ref 3.87–5.11)
RDW: 14.5 % (ref 11.5–15.5)
WBC: 22.6 10*3/uL — ABNORMAL HIGH (ref 4.0–10.5)
nRBC: 0 % (ref 0.0–0.2)

## 2020-12-31 LAB — COOXEMETRY PANEL
Carboxyhemoglobin: 1.3 % (ref 0.5–1.5)
Methemoglobin: 1.1 % (ref 0.0–1.5)
O2 Saturation: 63.2 %
Total hemoglobin: 9.4 g/dL — ABNORMAL LOW (ref 12.0–16.0)

## 2020-12-31 LAB — DIGOXIN LEVEL: Digoxin Level: 1.1 ng/mL (ref 0.8–2.0)

## 2020-12-31 MED ORDER — INSULIN ASPART 100 UNIT/ML IJ SOLN
0.0000 [IU] | Freq: Three times a day (TID) | INTRAMUSCULAR | Status: DC
  Administered 2020-12-31: 4 [IU] via SUBCUTANEOUS
  Administered 2020-12-31: 7 [IU] via SUBCUTANEOUS
  Administered 2021-01-01 – 2021-01-02 (×4): 4 [IU] via SUBCUTANEOUS
  Administered 2021-01-02: 3 [IU] via SUBCUTANEOUS
  Administered 2021-01-02 – 2021-01-03 (×3): 4 [IU] via SUBCUTANEOUS
  Administered 2021-01-03 – 2021-01-04 (×2): 3 [IU] via SUBCUTANEOUS
  Administered 2021-01-05: 4 [IU] via SUBCUTANEOUS
  Administered 2021-01-05 – 2021-01-06 (×3): 3 [IU] via SUBCUTANEOUS
  Administered 2021-01-06 – 2021-01-07 (×3): 4 [IU] via SUBCUTANEOUS
  Administered 2021-01-07: 3 [IU] via SUBCUTANEOUS
  Administered 2021-01-08: 4 [IU] via SUBCUTANEOUS
  Administered 2021-01-08: 3 [IU] via SUBCUTANEOUS
  Administered 2021-01-09: 6 [IU] via SUBCUTANEOUS
  Administered 2021-01-10: 4 [IU] via SUBCUTANEOUS
  Administered 2021-01-11 – 2021-01-12 (×3): 3 [IU] via SUBCUTANEOUS

## 2020-12-31 MED ORDER — INSULIN ASPART 100 UNIT/ML IJ SOLN
3.0000 [IU] | Freq: Three times a day (TID) | INTRAMUSCULAR | Status: DC
  Administered 2020-12-31 – 2021-01-12 (×21): 3 [IU] via SUBCUTANEOUS

## 2020-12-31 MED ORDER — ALBUMIN HUMAN 5 % IV SOLN
12.5000 g | Freq: Once | INTRAVENOUS | Status: AC
  Administered 2020-12-31: 12.5 g via INTRAVENOUS
  Filled 2020-12-31: qty 250

## 2020-12-31 MED ORDER — PANTOPRAZOLE SODIUM 40 MG PO TBEC
40.0000 mg | DELAYED_RELEASE_TABLET | Freq: Every day | ORAL | Status: DC
  Administered 2020-12-31 – 2021-01-12 (×13): 40 mg via ORAL
  Filled 2020-12-31 (×12): qty 1

## 2020-12-31 MED ORDER — DIGOXIN 125 MCG PO TABS
0.0625 mg | ORAL_TABLET | Freq: Every day | ORAL | Status: DC
  Administered 2020-12-31 – 2021-01-01 (×2): 0.0625 mg via ORAL
  Filled 2020-12-31 (×2): qty 1

## 2020-12-31 MED ORDER — METOCLOPRAMIDE HCL 5 MG/ML IJ SOLN
5.0000 mg | Freq: Four times a day (QID) | INTRAMUSCULAR | Status: AC
  Administered 2020-12-31 (×4): 5 mg via INTRAVENOUS
  Filled 2020-12-31 (×4): qty 2

## 2020-12-31 MED ORDER — FUROSEMIDE 10 MG/ML IJ SOLN
40.0000 mg | Freq: Three times a day (TID) | INTRAMUSCULAR | Status: DC
  Administered 2020-12-31 – 2021-01-01 (×3): 40 mg via INTRAVENOUS
  Filled 2020-12-31 (×3): qty 4

## 2020-12-31 MED ORDER — DIGOXIN 125 MCG PO TABS: 0.1250 mg | ORAL_TABLET | Freq: Every day | ORAL | Status: DC

## 2020-12-31 MED ORDER — FUROSEMIDE 10 MG/ML IJ SOLN
40.0000 mg | Freq: Two times a day (BID) | INTRAMUSCULAR | Status: DC
  Administered 2020-12-31: 40 mg via INTRAVENOUS
  Filled 2020-12-31: qty 4

## 2020-12-31 MED ORDER — ASPIRIN EC 81 MG PO TBEC
81.0000 mg | DELAYED_RELEASE_TABLET | Freq: Every day | ORAL | Status: DC
  Administered 2021-01-01 – 2021-01-05 (×5): 81 mg via ORAL
  Filled 2020-12-31 (×5): qty 1

## 2020-12-31 MED ORDER — ENOXAPARIN SODIUM 30 MG/0.3ML IJ SOSY
30.0000 mg | PREFILLED_SYRINGE | Freq: Every day | INTRAMUSCULAR | Status: AC
  Administered 2020-12-31 – 2021-01-04 (×5): 30 mg via SUBCUTANEOUS
  Filled 2020-12-31 (×5): qty 0.3

## 2020-12-31 MED ORDER — INSULIN ASPART 100 UNIT/ML IJ SOLN: 0.0000 [IU] | Freq: Every day | INTRAMUSCULAR | Status: DC

## 2020-12-31 MED ORDER — INSULIN DETEMIR 100 UNIT/ML ~~LOC~~ SOLN
40.0000 [IU] | Freq: Two times a day (BID) | SUBCUTANEOUS | Status: DC
  Administered 2020-12-31 – 2021-01-03 (×7): 40 [IU] via SUBCUTANEOUS
  Filled 2020-12-31 (×11): qty 0.4

## 2020-12-31 NOTE — Progress Notes (Signed)
[facility] Evening Rounds  POD #2 s/p MVr/TVr/maze  No complaints Trying to diurese  PE: BP (!) 128/53   Pulse 90   Temp 97.8 F (36.6 C) (Axillary)   Resp 13   Ht 5\' 7"  (1.702 m)   Wt (!) 136.2 kg   SpO2 91%   BMI 47.03 kg/m  Alert/oriented CTA RRR Incision intact   Intake/Output Summary (Last 24 hours) at 12/31/2020 1754 Last data filed at 12/31/2020 1700 Gross per 24 hour  Intake 1319.69 ml  Output 470 ml  Net 849.69 ml    Labs/films reviewed  A/p:  Keep eye of kidney function/diuresis Continue milrinone Zackaria Burkey Z. Orvan Seen, [place]

## 2020-12-31 NOTE — Anesthesia Postprocedure Evaluation (Signed)
Anesthesia Post Note  Patient: Shawntelle Ungar  Procedure(s) Performed: MITRAL VALVE REPAIR USING CARBOMEDICS ANNULOFLEX RING SIZE 30 (N/A Chest) 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 RING SIZE 34 (N/A ) MAZE (N/A ) TRANSESOPHAGEAL ECHOCARDIOGRAM (TEE) (N/A )     Patient location during evaluation: SICU Anesthesia Type: General Level of consciousness: sedated Pain management: pain level controlled Vital Signs Assessment: post-procedure vital signs reviewed and stable Respiratory status: patient remains intubated per anesthesia plan Cardiovascular status: stable Postop Assessment: no apparent nausea or vomiting Anesthetic complications: no   No complications documented.  Last Vitals:  Vitals:   12/31/20 1200 12/31/20 1300  BP: (!) 87/74 110/89  Pulse: 88 85  Resp: (!) 22 (!) 8  Temp:    SpO2: 95% 96%    Last Pain:  Vitals:   12/31/20 1200  TempSrc:   PainSc: 7    Pain Goal: Patients Stated Pain Goal: 5 (12/31/20 0800)                 Tiajuana Amass

## 2020-12-31 NOTE — Progress Notes (Signed)
Progress Note  Patient Name: [name] Date of Encounter: 12/31/2020  Primary Cardiologist: Buford Dresser, MD   Subjective   No events overnight.  Off Vasopressin, Luiz Blare out, A line out. Venous Co-Ox pending. Patient notes that she is still having sternal pain.  No palpitations or SOB.  Notes that she is having a return to arm and leg twitches that the was having last week.  Inpatient Medications    Scheduled Meds: . acetaminophen  1,000 mg Oral Q6H   Or  . acetaminophen (TYLENOL) oral liquid 160 mg/5 mL  1,000 mg Per Tube Q6H  . acetaminophen (TYLENOL) oral liquid 160 mg/5 mL  650 mg Per Tube Once   Or  . acetaminophen  650 mg Rectal Once  . amiodarone  200 mg Oral Daily  . aspirin EC  325 mg Oral Daily   Or  . aspirin  324 mg Per Tube Daily  . bisacodyl  10 mg Oral Daily   Or  . bisacodyl  10 mg Rectal Daily  . Chlorhexidine Gluconate Cloth  6 each Topical Daily  . digoxin  0.125 mg Oral Daily  . docusate sodium  200 mg Oral Daily  . DULoxetine  60 mg Oral Daily  . enoxaparin (LOVENOX) injection  40 mg Subcutaneous QHS  . furosemide  40 mg Intravenous BID  . gabapentin  600 mg Oral BID  . insulin aspart  0-20 Units Subcutaneous TID WC  . insulin aspart  0-5 Units Subcutaneous QHS  . insulin aspart  3 Units Subcutaneous TID WC  . insulin detemir  40 Units Subcutaneous BID  . mouth rinse  15 mL Mouth Rinse BID  . metoCLOPramide (REGLAN) injection  5 mg Intravenous Q6H  . pantoprazole sodium  40 mg Oral Daily  . rosuvastatin  10 mg Oral Daily  . sodium chloride flush  10-40 mL Intracatheter Q12H  . sodium chloride flush  3 mL Intravenous Q12H   Continuous Infusions: . sodium chloride Stopped (12/30/20 1019)  . sodium chloride    . sodium chloride 20 mL/hr at 12/29/20 1533  . albumin human    . dexmedetomidine (PRECEDEX) IV infusion 0.7 mcg/kg/hr (12/29/20 1520)  . lactated ringers    . lactated ringers 20 mL/hr at 12/31/20 0700  . milrinone  0.125 mcg/kg/min (12/31/20 0700)  . nitroGLYCERIN Stopped (12/29/20 1520)  . norepinephrine (LEVOPHED) Adult infusion Stopped (12/30/20 0525)  . phenylephrine (NEO-SYNEPHRINE) Adult infusion    . vasopressin Stopped (12/30/20 1138)   PRN Meds: sodium chloride, dextrose, metoprolol tartrate, midazolam, morphine injection, ondansetron (ZOFRAN) IV, oxyCODONE, sodium chloride flush, sodium chloride flush, traMADol   Vital Signs    Vitals:   12/31/20 0400 12/31/20 0500 12/31/20 0600 12/31/20 0700  BP: 127/72 124/68 (!) 119/99 104/61  Pulse: 84 84 86 86  Resp: 13 (!) 9 (!) 28 11  Temp:      TempSrc:      SpO2: 97% 96% 97% 93%  Weight:  (!) 136.2 kg    Height:        Intake/Output Summary (Last 24 hours) at 12/31/2020 0754 Last data filed at 12/31/2020 0700 Gross per 24 hour  Intake 1329.34 ml  Output 730 ml  Net 599.34 ml   Filed Weights   12/29/20 0219 12/30/20 0545 12/31/20 0500  Weight: 125.2 kg 131.8 kg (!) 136.2 kg    Telemetry    V pacing; significant artifact from twitching - Personally Reviewed  ECG    No new - Personally Reviewed  Physical Exam   GEN: No acute distress.   Neck: No JVD R IN line c/d/i Cardiac: RRR, no murmurs, rubs, or gallops.  Respiratory: Clear to auscultation bilaterally, decrease air movement in bases GI: Soft, nontender, non-distended  MS: No edema; No deformity. Neuro:  cyclical twitching of her hands without asterixis Psych: Normal affect   Labs    Chemistry Recent Labs  Lab 12/30/20 0414 12/30/20 1641 12/31/20 0306  NA 136 133* 134*  K 4.6 4.3 4.3  CL 102 100 99  CO2 24 25 25   GLUCOSE 161* 178* 127*  BUN 30* 32* 35*  CREATININE 2.00* 2.55* 3.03*  CALCIUM 8.9 9.1 9.4  GFRNONAA 27* 20* 17*  ANIONGAP 10 8 10      Hematology Recent Labs  Lab 12/30/20 0414 12/30/20 1641 12/31/20 0306  WBC 19.0* 21.6* 22.6*  RBC 3.55* 3.36* 3.23*  HGB 10.1* 9.5* 9.3*  HCT 31.6* 30.9* 29.4*  MCV 89.0 92.0 91.0  MCH 28.5 28.3 28.8   MCHC 32.0 30.7 31.6  RDW 14.1 14.5 14.5  PLT 123* 126* 120*    Cardiac EnzymesNo results for input(s): TROPONINI in the last 168 hours. No results for input(s): TROPIPOC in the last 168 hours.   BNPNo results for input(s): BNP, PROBNP in the last 168 hours.   DDimer No results for input(s): DDIMER in the last 168 hours.   Radiology    DG Chest Port 1 View  Result Date: 12/30/2020 CLINICAL DATA:  Chest tube, postop EXAM: PORTABLE CHEST 1 VIEW COMPARISON:  12/29/2020 FINDINGS: Interval removal of endotracheal tube and NG tube. Swan-Ganz catheter and bilateral chest tubes remain in place, unchanged. No pneumothorax. Right PICC line tip remains in the SVC. Mild cardiomegaly and vascular congestion. Bibasilar atelectasis. IMPRESSION: Interval extubation. Mild bibasilar atelectasis and vascular congestion. Electronically Signed   By: Rolm Baptise M.D.   On: 12/30/2020 08:26   DG Chest Port 1 View  Result Date: 12/29/2020 CLINICAL DATA:  Hypoxia.  Status post tricuspid valve repair EXAM: PORTABLE CHEST 1 VIEW COMPARISON:  Dec 28, 2020 FINDINGS: Endotracheal tube tip is 4.4 cm above the carina. Nasogastric tube tip is below the diaphragm. Swan-Ganz catheter tip is in the proximal right main pulmonary artery. There are mediastinal drains bilaterally. Temporary pacemaker wires are attached to the right heart. No pneumothorax. Patient is status post tricuspid valve replacement. There is a left atrial appendage clamp. There is no edema or airspace opacity. There is left perihilar atelectasis with questionable superimposed postoperative hemorrhage. Heart is mildly enlarged with pulmonary vascularity normal. No adenopathy. No bone lesions IMPRESSION: Tube and catheter positions as described without pneumothorax. Apparent atelectasis in the left perihilar region. A degree of superimposed hemorrhage in this area postoperative cannot be excluded. Lungs otherwise clear. Stable cardiac prominence. Postoperative  changes noted. Electronically Signed   By: Lowella Grip III M.D.   On: 12/29/2020 16:13   ECHO INTRAOPERATIVE TEE  Result Date: 12/29/2020  *INTRAOPERATIVE TRANSESOPHAGEAL REPORT *  Patient Name:   [name] Date of Exam: 12/29/2020 Medical Rec #:  [id number]            Height:       67.0 in Accession #:    [id number]           Weight:       276.0 lb Date of Birth:  27-Jan-1956            BSA:          2.32 m Patient Age:  64 years             BP:           115/68 mmHg Patient Gender: F                    HR:           86 bpm. Exam Location:  Anesthesiology Transesophogeal exam was perform intraoperatively during surgical procedure. Patient was closely monitored under general anesthesia during the entirety of examination. Indications:     Mitral Valve Disease, Tricuspid Valve Disease Sonographer:     Bernadene Person RDCS Performing Phys: [place] Diagnosing Phys: Suzette Battiest MD Complications: No known complications during this procedure. POST-OP IMPRESSIONS - Left Ventricle: The left ventricle is unchanged from pre-bypass. - Right Ventricle: The right ventricle appears unchanged from pre-bypass. - Aorta: The aorta appears unchanged from pre-bypass. - Left Atrium: The left atrium appears unchanged from pre-bypass. - Left Atrial Appendage: The left atrial appendage appears unchanged from pre-bypass. - Aortic Valve: The aortic valve appears unchanged from pre-bypass. - Mitral Valve: No stenosis present. There is trace regurgitation. The mitral valve is status post repair with an annuloplasty ring. - Tricuspid Valve: No stenosis present. There is moderate regurgitation. - Pulmonic Valve: The pulmonic valve appears unchanged from pre-bypass. - Interatrial Septum: The interatrial septum appears unchanged from pre-bypass. - Interventricular Septum: The interventricular septum appears unchanged from pre-bypass. - Pericardium: The pericardium appears unchanged from pre-bypass. PRE-OP  FINDINGS  Left Ventricle: The left ventricle has mildly reduced systolic function, with an ejection fraction of 45-50%. The cavity size was mildly dilated. There is concentric left ventricular hypertrophy. Right Ventricle: The right ventricle has mildly reduced systolic function. The cavity was dialated. There is no increase in right ventricular wall thickness. Left Atrium: Left atrial size was dilated. No left atrial/left atrial appendage thrombus was detected. Right Atrium: Right atrial size was dilated. Interatrial Septum: No atrial level shunt detected by color flow Doppler. Pericardium: There is no evidence of pericardial effusion. Mitral Valve: The mitral valve is normal in structure. Mitral valve regurgitation is moderate by color flow Doppler. The MR jet is posteriorly-directed. There is No evidence of mitral stenosis. There is mild thickening present. Mildly restricted posterior leaflet. MV annulus measures 3.44cm. Tricuspid Valve: The tricuspid valve was normal in structure. Tricuspid valve regurgitation is severe by color flow Doppler. The jet is directed toward the atrial septum. No evidence of tricuspid stenosis is present. The tricuspid valve is status post repair with an annuloplasty ring. Aortic Valve: The aortic valve is tricuspid Aortic valve regurgitation is trivial by color flow Doppler. There is no stenosis of the aortic valve. Pulmonic Valve: The pulmonic valve was normal in structure. Pulmonic valve regurgitation is not visualized by color flow Doppler. Aorta: The aortic root, ascending aorta and aortic arch are normal in size and structure.  Suzette Battiest MD Electronically signed by Suzette Battiest MD Signature Date/Time: 12/29/2020/3:38:48 PM    Final     Cardiac Studies   Transthoracic Echocardiogram: Date: 07/08/20 Results: Eccentric MR; at least moderate  1. Left ventricular ejection fraction, by estimation, is 45 to 50%. The  left ventricle has mildly decreased function.  The left ventricle  demonstrates global hypokinesis. Left ventricular diastolic function could  not be evaluated.   2. Right ventricular systolic function was not well visualized. The right  ventricular size is moderately enlarged. There is normal pulmonary artery  systolic pressure.   3. Left atrial  size was mildly dilated.   4. Right atrial size was moderately dilated.   5. Eccentric MR jet; appears mild-moderate on parasternal views, but on  apical views (see images 52, 66, 72) extends into distal atrium and may  have Coanda effect. Incompletely visualized and PV not sampled, suspect  moderate to severe MR.. The mitral  valve is rheumatic. Moderate to severe mitral valve regurgitation.   6. Tricuspid valve regurgitation is severe.   7. The aortic valve is tricuspid. Aortic valve regurgitation is not  visualized. No aortic stenosis is present.   8. The inferior vena cava is normal in size with <50% respiratory  variability, suggesting right atrial pressure of 8 mmHg.    Transesophageal Echocardiogram: Date: 11/26/20 Results: Severe MR and TR  1. Left ventricular ejection fraction, by estimation, is 45 to 50%. The  left ventricle has mildly decreased function. The left ventricle  demonstrates global hypokinesis.   2. Right ventricular systolic function is moderately reduced. The right  ventricular size is moderately enlarged.   3. Left atrial size was severely dilated. No left atrial/left atrial  appendage thrombus was detected.   4. Right atrial size was severely dilated.   5. MR more appreciable early in the study when blood pressure was normal.  MR jet appears less later in the study, likely due to reduction in blood  pressure. Posterior leaflet appears mildly restricted, with override of  anterior leaflet. Systolic flow  reversal seen in right sided pulmonary veins.. The mitral valve is  abnormal. Severe mitral valve regurgitation. The mean mitral valve  gradient is 2.0 mmHg.    6. Tricuspid valve regurgitation is severe.   7. The aortic valve is tricuspid. Aortic valve regurgitation is not  visualized. No aortic stenosis is present.    NonCardiac CT : Date: 01/15/20 Results: Aortic Atherosclerosis mPA dilation 33 mm CS dilation 15 mm   Left/Right Heart Catheterizations: Date: 12/22/20 Results: 1. Elevated R > L heart filling pressures with PAPi low but not markedly so (1.8) suggestive of a significant component of RV dysfunction.  2. Prominent v-waves in PCWP and RA pressure tracings suggestive of significant MR and TR.  3. Cardiac index low.    Date: 12/31/2017 Results: minimal non-obstructive CAD  Prox LAD lesion is 10% stenosed.  Dist LM to Ost LAD lesion is 10% stenosed.  Ost Cx lesion is 15% stenosed.  The left ventricular systolic function is normal.  LV end diastolic pressure is normal.   Patient Profile     65 y.o. female with a history of morbid obesity, Severe TR, severe MR, HFmrEF, Morbid Obesity DM/HTN/HLd, and Longstanding persistent AF who presented with HF Decompensation now s/p Annulplasty/MAZE/LAA Ligation  Assessment & Plan    Severe TR s/p annuloplasty Severe MR s/p annuloplasty Heart Failure mildly reduced Ejection Fraction  - NYHA class III, Stage D, hypervolemic, etiology from AF likely - Diuretic regimen: agree with lasix 40 mg PO BID - BB held; likely return to low dose metoprolol succinate at DC - ARNI/ARB/ACEi/MRA/SGLT2i have been not started in the setting of CKD IV - pending venous co-ox for decision about milrinone wean    Longstanding Persistent Atrial Fibrillation - CHADSVASC 4+ (vascular disease not included in her assessment) on AC - ASA 324 per TCTS - dig level 1.1; given plans to likely stop milrinone; concern for subsequent AKI, will repeat dig level over the weekend - continue low dose amiodarone 200 mg PO Daily - s/p MAZE and LAD - will need  TEE as outpatient to confirm LAA-exclusion  Morbid  Obesity/DM/HTN DM with HLD Aortic Atherosclerosis - continue rosuvastatin 10 mg PO Daily   Discussed with nursing     For questions or updates, please contact [facility] Please consult [email or web address] for contact info under Cardiology/STEMI.      Signed, Werner Lean, MD  12/31/2020, 7:54 AM

## 2020-12-31 NOTE — Plan of Care (Signed)
  Problem: Education: Goal: Ability to demonstrate management of disease process will improve Outcome: Progressing Goal: Ability to verbalize understanding of medication therapies will improve Outcome: Progressing Goal: Individualized Educational Video(s) Outcome: Progressing   Problem: Activity: Goal: Capacity to carry out activities will improve Outcome: Progressing   Problem: Cardiac: Goal: Ability to achieve and maintain adequate cardiopulmonary perfusion will improve Outcome: Progressing   Problem: Education: Goal: Knowledge of General Education information will improve Description: Including pain rating scale, medication(s)/side effects and non-pharmacologic comfort measures Outcome: Progressing   Problem: Health Behavior/Discharge Planning: Goal: Ability to manage health-related needs will improve Outcome: Progressing   Problem: Clinical Measurements: Goal: Ability to maintain clinical measurements within normal limits will improve Outcome: Progressing Goal: Will remain free from infection Outcome: Progressing Goal: Diagnostic test results will improve Outcome: Progressing Goal: Respiratory complications will improve Outcome: Progressing Goal: Cardiovascular complication will be avoided Outcome: Progressing   Problem: Activity: Goal: Risk for activity intolerance will decrease Outcome: Progressing   Problem: Nutrition: Goal: Adequate nutrition will be maintained Outcome: Progressing   Problem: Coping: Goal: Level of anxiety will decrease Outcome: Progressing   Problem: Elimination: Goal: Will not experience complications related to bowel motility Outcome: Progressing Goal: Will not experience complications related to urinary retention Outcome: Progressing   Problem: Pain Managment: Goal: General experience of comfort will improve Outcome: Progressing   Problem: Safety: Goal: Ability to remain free from injury will improve Outcome: Progressing    Problem: Skin Integrity: Goal: Risk for impaired skin integrity will decrease Outcome: Progressing   Problem: Education: Goal: Will demonstrate proper wound care and an understanding of methods to prevent future damage Outcome: Progressing Goal: Knowledge of disease or condition will improve Outcome: Progressing Goal: Knowledge of the prescribed therapeutic regimen will improve Outcome: Progressing Goal: Individualized Educational Video(s) Outcome: Progressing   Problem: Activity: Goal: Risk for activity intolerance will decrease Outcome: Progressing   Problem: Cardiac: Goal: Will achieve and/or maintain hemodynamic stability Outcome: Progressing   Problem: Clinical Measurements: Goal: Postoperative complications will be avoided or minimized Outcome: Progressing   Problem: Respiratory: Goal: Respiratory status will improve Outcome: Progressing   Problem: Skin Integrity: Goal: Wound healing without signs and symptoms of infection Outcome: Progressing Goal: Risk for impaired skin integrity will decrease Outcome: Progressing   Problem: Urinary Elimination: Goal: Ability to achieve and maintain adequate renal perfusion and functioning will improve Outcome: Progressing

## 2020-12-31 NOTE — Progress Notes (Signed)
2 Days Post-Op Procedure(s) (LRB): MITRAL VALVE REPAIR USING CARBOMEDICS ANNULOFLEX RING SIZE 30 (N/A) 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 RING SIZE 34 (N/A) MAZE (N/A) TRANSESOPHAGEAL ECHOCARDIOGRAM (TEE) (N/A) Subjective: C/o pain, nausea Ambulated a short distance this AM Objective: Vital signs in last 24 hours: Temp:  [96.3 F (35.7 C)-97.5 F (36.4 C)] 96.3 F (35.7 C) (05/13 0328) Pulse Rate:  [80-86] 86 (05/13 0700) Cardiac Rhythm: A-V Sequential paced (05/12 2000) Resp:  [7-28] 11 (05/13 0700) BP: (104-149)/(58-109) 104/61 (05/13 0700) SpO2:  [90 %-99 %] 93 % (05/13 0700) Arterial Line BP: (110-141)/(45-59) 120/45 (05/12 1500) Weight:  [136.2 kg] 136.2 kg (05/13 0500)  Hemodynamic parameters for last 24 hours: PAP: (43-51)/(9-15) 49/14  Intake/Output from previous day: 05/12 0701 - 05/13 0700 In: 1329.3 [P.O.:240; I.V.:603.2; IV Piggyback:486.1] Out: 730 [Urine:450; Chest Tube:280] Intake/Output this shift: No intake/output data recorded.  General appearance: alert, cooperative and no distress Neurologic: intact Heart: regular rate and rhythm Lungs: diminished breath sounds bibasilar Abdomen: hypoactive BS  Lab Results: Recent Labs    12/30/20 1641 12/31/20 0306  WBC 21.6* 22.6*  HGB 9.5* 9.3*  HCT 30.9* 29.4*  PLT 126* 120*   BMET:  Recent Labs    12/30/20 1641 12/31/20 0306  NA 133* 134*  K 4.3 4.3  CL 100 99  CO2 25 25  GLUCOSE 178* 127*  BUN 32* 35*  CREATININE 2.55* 3.03*  CALCIUM 9.1 9.4    PT/INR:  Recent Labs    12/29/20 1547  LABPROT 21.6*  INR 1.9*   ABG    Component Value Date/Time   PHART 7.368 12/30/2020 0050   HCO3 23.0 12/30/2020 0050   TCO2 24 12/30/2020 0050   ACIDBASEDEF 2.0 12/30/2020 0050   O2SAT 71.5 12/30/2020 0518   CBG (last 3)  Recent Labs    12/31/20 0158 12/31/20 0313 12/31/20 0552  GLUCAP 109* 131* 111*    Assessment/Plan: S/P Procedure(s) (LRB): MITRAL VALVE REPAIR USING  CARBOMEDICS ANNULOFLEX RING SIZE 30 (N/A) 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 RING SIZE 34 (N/A) MAZE (N/A) TRANSESOPHAGEAL ECHOCARDIOGRAM (TEE) (N/A) -POD # 2 CV- In SR on amiodarone, beta blocker held for 1st degree block  On  Milrinone 0.125- will check co-ox  Restart Digoxin  Resume Eliquis prior to DC RESP- CXR oK, IS RENAL- creatinine up to 3.0- not surprising in setting of CKD post bypass  K= 4.3  Will check co-ox, lasix, keep Foley to monitor UO ENDO- CBG well controlled- change to levemir _ meal coerage + SSI GI- nausea a limiting factor, will add Reglan for 24 hours Anemia secondary to ABL- stable SCD + enoxaparin for DVT prophylaxis Cardiac rehab   LOS: 10 days    [name] 12/31/2020

## 2021-01-01 ENCOUNTER — Encounter (HOSPITAL_COMMUNITY): Payer: Self-pay | Admitting: Thoracic Surgery (Cardiothoracic Vascular Surgery)

## 2021-01-01 ENCOUNTER — Inpatient Hospital Stay (HOSPITAL_COMMUNITY): Payer: Medicare Other

## 2021-01-01 DIAGNOSIS — I4821 Permanent atrial fibrillation: Secondary | ICD-10-CM | POA: Diagnosis not present

## 2021-01-01 DIAGNOSIS — Z9889 Other specified postprocedural states: Secondary | ICD-10-CM | POA: Diagnosis not present

## 2021-01-01 DIAGNOSIS — N179 Acute kidney failure, unspecified: Secondary | ICD-10-CM | POA: Diagnosis not present

## 2021-01-01 DIAGNOSIS — I5023 Acute on chronic systolic (congestive) heart failure: Secondary | ICD-10-CM | POA: Diagnosis not present

## 2021-01-01 LAB — BASIC METABOLIC PANEL
Anion gap: 12 (ref 5–15)
BUN: 45 mg/dL — ABNORMAL HIGH (ref 8–23)
CO2: 22 mmol/L (ref 22–32)
Calcium: 9.1 mg/dL (ref 8.9–10.3)
Chloride: 96 mmol/L — ABNORMAL LOW (ref 98–111)
Creatinine, Ser: 4.11 mg/dL — ABNORMAL HIGH (ref 0.44–1.00)
GFR, Estimated: 12 mL/min — ABNORMAL LOW (ref >60)
Glucose, Bld: 196 mg/dL — ABNORMAL HIGH (ref 70–99)
Potassium: 4.6 mmol/L (ref 3.5–5.1)
Sodium: 130 mmol/L — ABNORMAL LOW (ref 135–145)

## 2021-01-01 LAB — COOXEMETRY PANEL
Carboxyhemoglobin: 1.4 % (ref 0.5–1.5)
Methemoglobin: 1.2 % (ref 0.0–1.5)
O2 Saturation: 49 %
Total hemoglobin: 8.4 g/dL — ABNORMAL LOW (ref 12.0–16.0)

## 2021-01-01 LAB — CBC
HCT: 26 % — ABNORMAL LOW (ref 36.0–46.0)
Hemoglobin: 8.2 g/dL — ABNORMAL LOW (ref 12.0–15.0)
MCH: 28.9 pg (ref 26.0–34.0)
MCHC: 31.5 g/dL (ref 30.0–36.0)
MCV: 91.5 fL (ref 80.0–100.0)
Platelets: 144 10*3/uL — ABNORMAL LOW (ref 150–400)
RBC: 2.84 MIL/uL — ABNORMAL LOW (ref 3.87–5.11)
RDW: 14.6 % (ref 11.5–15.5)
WBC: 20.2 10*3/uL — ABNORMAL HIGH (ref 4.0–10.5)
nRBC: 0.1 % (ref 0.0–0.2)

## 2021-01-01 LAB — SODIUM, URINE, RANDOM: Sodium, Ur: <10 mmol/L

## 2021-01-01 LAB — GLUCOSE, CAPILLARY
Glucose-Capillary: 197 mg/dL — ABNORMAL HIGH (ref 70–99)
Glucose-Capillary: 177 mg/dL — ABNORMAL HIGH (ref 70–99)
Glucose-Capillary: 127 mg/dL — ABNORMAL HIGH (ref 70–99)
Glucose-Capillary: 190 mg/dL — ABNORMAL HIGH (ref 70–99)

## 2021-01-01 LAB — CREATININE, URINE, RANDOM: Creatinine, Urine: 165.99 mg/dL

## 2021-01-01 LAB — DIGOXIN LEVEL: Digoxin Level: 1.2 ng/mL (ref 0.8–2.0)

## 2021-01-01 MED ORDER — METOLAZONE 5 MG PO TABS
5.0000 mg | ORAL_TABLET | Freq: Once | ORAL | Status: AC
  Administered 2021-01-01: 5 mg via ORAL
  Filled 2021-01-01: qty 1

## 2021-01-01 MED ORDER — FUROSEMIDE 10 MG/ML IJ SOLN
30.0000 mg/h | INTRAVENOUS | Status: DC
  Administered 2021-01-01: 20 mg/h via INTRAVENOUS
  Administered 2021-01-02 (×2): 30 mg/h via INTRAVENOUS
  Administered 2021-01-02: 20 mg/h via INTRAVENOUS
  Administered 2021-01-03 – 2021-01-04 (×4): 30 mg/h via INTRAVENOUS
  Filled 2021-01-01 (×11): qty 20

## 2021-01-01 NOTE — Consult Note (Addendum)
Advanced Heart Failure Team Consult Note   Primary Physician: [name], NP PCP-Cardiologist:  Buford Dresser, MD  Reason for Consultation: Post-op HF  HPI:    [name] is seen today for evaluation of post-op HFat the request of Dr. Orvan Seen.   [name] is a 65 year old female with morbid obesity, chronic diastolic and systolic HF with mildly reduced LVEF (45-50%), severe MR/TR, atrial fibrillation on eliquis, HTN, and DMII who is followed by Dr. Harrell Gave as an out-patient. She has a known history of HF with mildly reduced LVEF of 45-50% with cath in 2019 with mild, non-obstructive CAD. Also with history of difficult to control Afib with failed tikosyn load now on amiodarone, dig, metop and apixaban for [facility]. Saw Dr. Harrell Gave in clinic on 11/22/20 where she was experiencing worsening SOB, weight gain, LE edema and chest pressure. She declined ER visit at that time. She was changed from lasix to torsemide for diuresis. Also planned for CV surgery evaluation for severe MR later this month.  Admitted 12/19/20 with worsening HF.  LHC 5/19 non-obstructive CAD. RHC 12/22/20 with elevated filling pressures and low CI.   Underwent MV/TV repair with Maze on 5/12. Now struggling with low co-ox, progressive renal failure and volume overload.   Co-ox 48% this am on milrinone 0.125. Cr 1.5 -> 4.1   Denies orthopnea or PND. + SOB with exertion.    Review of Systems: [y] = yes, _0  = no   . General: Weight gain _1 ; Weight loss _2 ; Anorexia _3 ; Fatigue _4 ; Fever _5 ; Chills _6 ; Weakness _7   . Cardiac: Chest pain/pressure _8 ; Resting SOB _9 ; Exertional SOB [ y]; Orthopnea _10 ; Pedal Edema Blue.[name] ]; Palpitations _11 ; Syncope _12 ; Presyncope _13 ; Paroxysmal nocturnal dyspnea_14   . Pulmonary: Cough _15 ; Wheezing_16 ; Hemoptysis_17 ; Sputum _18 ; Snoring _19   . GI: Vomiting_20 ; Dysphagia_21 ; Melena_22 ; Hematochezia _23 ; Heartburn_24 ; Abdominal pain _25 ; Constipation  _26 ; Diarrhea _27 ; BRBPR _28   . GU: Hematuria_29 ; Dysuria _30 ; Nocturia_31   . Vascular: Pain in legs with walking _32 ; Pain in feet with lying flat _33 ; Non-healing sores _34 ; Stroke _35 ; TIA _36 ; Slurred speech _37 ;  . Neuro: Headaches_38 ; Vertigo_39 ; Seizures_40 ; Paresthesias_41 ;Blurred vision _42 ; Diplopia _43 ; Vision changes _44   . Ortho/Skin: Arthritis [ y]; Joint pain Blue.[name] ]; Muscle pain _45 ; Joint swelling _46 ; Back Pain _47 ; Rash _48   . Psych: Depression_49 ; Anxiety_50   . Heme: Bleeding problems _51 ; Clotting disorders _52 ; Anemia _53   . Endocrine: Diabetes [ y]; Thyroid dysfunction_54   Home Medications Prior to Admission medications   Medication Sig Start Date End Date Taking? Authorizing Provider  Accu-Chek Softclix Lancets lancets Use as instructed to check blood sugar three times daily. E11.69 04/21/20  Yes Newlin, Charlane Ferretti, MD  albuterol (VENTOLIN HFA) 108 (90 Base) MCG/ACT inhaler Inhale 2 puffs into the lungs every 4 (four) hours as needed for wheezing or shortness of breath.  08/15/19  Yes [provider]  Alcohol Swabs (B-D SINGLE USE SWABS REGULAR) PADS  02/19/19  Yes [provider]  amiodarone (PACERONE) 200 MG tablet Take 1 tablet (200 mg total) by mouth 2 (two) times daily. 11/05/20  Yes Baldwin Jamaica, PA-C  apixaban (ELIQUIS) 5 MG TABS tablet  Take 1 tablet (5 mg total) by mouth 2 (two) times daily. 11/09/20  Yes Buford Dresser, MD  aspirin-sod bicarb-citric acid (ALKA-SELTZER) 325 MG TBEF tablet Take 325 mg by mouth every 6 (six) hours as needed (indigestion).   Yes [provider]  Blood Glucose Monitoring Suppl (ACCU-CHEK GUIDE ME) w/Device KIT Use as instructed to check blood sugar three times daily. E11.69 04/21/20  Yes Charlott Rakes, MD  Blood Glucose Monitoring Suppl (ACCU-CHEK GUIDE) w/Device KIT USE AS INSTRUCTED TO CHECK BLOOD SUGAR THREE TIMES DAILY. E11.69 04/21/20  Yes [provider]  digoxin (LANOXIN) 0.125 MG tablet Take  0.125 mg by mouth daily.  11/04/19  Yes [provider]  DULoxetine (CYMBALTA) 60 MG capsule Take 1 capsule (60 mg total) by mouth daily. 09/24/20  Yes [name], NP  gabapentin (NEURONTIN) 300 MG capsule Take 2 capsules (600 mg total) by mouth 2 (two) times daily. 09/24/20  Yes [name], NP  insulin aspart (NOVOLOG) 100 UNIT/ML injection Inject 12 Units into the skin 3 (three) times daily before meals. For blood sugars 0-199 give 0 units of insulin, 201-250 give 4 units, 251-300 give 6 units, 301-350 give 8 units, 351-400 give 10 units,> 400 give 12 units and call M.D. Patient taking differently: Inject 5 Units into the skin 3 (three) times daily before meals. For blood sugars 0-199 give 0 units of insulin, 201-250 give 4 units, 251-300 give 6 units, 301-350 give 8 units, 351-400 give 7. 06/24/20  Yes Edwards, Michelle P, NP  LEVEMIR 100 UNIT/ML injection INJECT 0.4 MLS (40 UNITS TOTAL) INTO THE SKIN 2 (TWO) TIMES DAILY. Patient taking differently: Inject 50 Units into the skin 2 (two) times daily. 12/01/20  Yes [name], NP  Multiple Vitamins-Minerals (MULTIVITAMIN WOMEN PO) Take 1 tablet by mouth daily.    Yes [provider]  nitroGLYCERIN (NITROSTAT) 0.4 MG SL tablet Place 1 tablet (0.4 mg total) under the tongue every 5 (five) minutes x 3 doses as needed for chest pain. 01/08/14  Yes Charolette Forward, MD  TRUE METRIX BLOOD GLUCOSE TEST test strip USE AS INSTRUCTED TO CHECK BLOOD SUGAR DAILY. 07/21/20  Yes Newlin, Charlane Ferretti, MD  TRULICITY 1.5 IO/2.7OJ SOPN INJECT 1.5 MG INTO THE SKIN ONCE A WEEK. 12/05/20  Yes [name], NP  EPINEPHrine 0.3 mg/0.3 mL IJ SOAJ injection Inject 0.3 mg into the muscle as needed for anaphylaxis. Patient taking differently: Inject 0.3 mg into the muscle as needed for anaphylaxis. Never used 06/25/20   [name], NP  metoprolol succinate (TOPROL-XL) 100 MG 24 hr tablet Take 100 mg by mouth in the morning and at  bedtime. Take with or immediately following a meal.    [provider]  Potassium Chloride ER 20 MEQ TBCR Take 20 mEq by mouth daily. 11/05/20   Baldwin Jamaica, PA-C  RESTASIS 0.05 % ophthalmic emulsion Place 1 drop into both eyes 2 (two) times daily. 11/19/17   [provider]  rosuvastatin (CRESTOR) 10 MG tablet Take 1 tablet (10 mg total) by mouth daily. 09/24/20   [name], NP  torsemide 40 MG TABS Take 40 mg by mouth 2 (two) times daily. 11/22/20 02/20/21  Buford Dresser, MD    Past Medical History: Past Medical History:  Diagnosis Date  . Allergic rhinitis   . Arthritis   . Asthma   . Chest pain 12/27/2017  . Chronic diastolic CHF (congestive heart failure) ([place])   . COPD (chronic obstructive pulmonary  disease) ([place])   . Depression   . DM (diabetes mellitus) ([place])   . DVT (deep vein thrombosis) in pregnancy   . Dysrhythmia    atrial fibrilation  . GERD (gastroesophageal reflux disease)   . Headache(784.0)   . HTN (hypertension)   . Hyperlipidemia   . Obesity   . Pneumonia 04/2018   RIGHT LOBE  . Shortness of breath   . Sleep apnea    compliant with CPAP    Past Surgical History: Past Surgical History:  Procedure Laterality Date  . ABDOMINAL HYSTERECTOMY    . BUBBLE STUDY  11/26/2020   Procedure: BUBBLE STUDY;  Surgeon: Buford Dresser, MD;  Location: [place];  Service: Cardiovascular;;  . CARDIOVERSION N/A 05/06/2020   Procedure: CARDIOVERSION;  Surgeon: Buford Dresser, MD;  Location: [facility] ENDOSCOPY;  Service: Cardiovascular;  Laterality: N/A;  . CARDIOVERSION N/A 11/04/2020   Procedure: CARDIOVERSION;  Surgeon: Lelon Perla, MD;  Location: [facility] ENDOSCOPY;  Service: Cardiovascular;  Laterality: N/A;  . CATARACT EXTRACTION, BILATERAL    . CHOLECYSTECTOMY    . COLONOSCOPY WITH PROPOFOL N/A 03/05/2015   Procedure: COLONOSCOPY WITH PROPOFOL;  Surgeon: Carol Ada, MD;  Location: [facility] ENDOSCOPY;  Service: Endoscopy;   Laterality: N/A;  . DIAGNOSTIC LAPAROSCOPY    . ingrown hallux Left   . KNEE SURGERY    . LEFT HEART CATH AND CORONARY ANGIOGRAPHY N/A 12/31/2017   Procedure: LEFT HEART CATH AND CORONARY ANGIOGRAPHY;  Surgeon: Charolette Forward, MD;  Location: [place] CV LAB;  Service: Cardiovascular;  Laterality: N/A;  . MAZE N/A 12/29/2020   Procedure: MAZE;  Surgeon: [name], MD;  Location: [place];  Service: Open Heart Surgery;  Laterality: N/A;  . MITRAL VALVE REPAIR N/A 12/29/2020   Procedure: MITRAL VALVE REPAIR USING CARBOMEDICS ANNULOFLEX RING SIZE 30;  Surgeon: [name], MD;  Location: [place];  Service: Open Heart Surgery;  Laterality: N/A;  . RIGHT HEART CATH N/A 12/22/2020   Procedure: RIGHT HEART CATH;  Surgeon: Larey Dresser, MD;  Location: [place] CV LAB;  Service: Cardiovascular;  Laterality: N/A;  . TEE WITHOUT CARDIOVERSION N/A 11/26/2020   Procedure: TRANSESOPHAGEAL ECHOCARDIOGRAM (TEE);  Surgeon: Buford Dresser, MD;  Location: [facility] ENDOSCOPY;  Service: Cardiovascular;  Laterality: N/A;  . TEE WITHOUT CARDIOVERSION N/A 12/29/2020   Procedure: TRANSESOPHAGEAL ECHOCARDIOGRAM (TEE);  Surgeon: [name], MD;  Location: [place];  Service: Open Heart Surgery;  Laterality: N/A;  . TRICUSPID VALVE REPLACEMENT N/A 12/29/2020   Procedure: 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 RING SIZE 34;  Surgeon: [name], MD;  Location: [place];  Service: Open Heart Surgery;  Laterality: N/A;    Family History: Family History  Problem Relation Age of Onset  . Cancer Mother   . Hypertension Mother   . Cancer Father   . CAD Other   . Hypertension Sister   . Hypertension Brother     Social History: Social History   Socioeconomic History  . Marital status: Significant Other    Spouse name: Not on file  . Number of children: 2  . Years of education: Not on file  . Highest education level: Some college, no degree  Occupational History  .  Not on file  Tobacco Use  . Smoking status: Never Smoker  . Smokeless tobacco: Never Used  Vaping Use  . Vaping Use: Never used  Substance and Sexual Activity  . Alcohol use: No  . Drug use: No  . Sexual activity:  Not Currently  Other Topics Concern  . Not on file  Social History Narrative   Pt lives in [place] with spouse.  2 grown children.   Previously worked in [employer] at [employer].  Now on disability   Social Determinants of Health   Financial Resource Strain: Medium Risk  . Difficulty of Paying Living Expenses: Somewhat hard  Food Insecurity: No Food Insecurity  . Worried About Charity fundraiser in the Last Year: Never true  . Ran Out of Food in the Last Year: Never true  Transportation Needs: Unmet Transportation Needs  . Lack of Transportation (Medical): Yes  . Lack of Transportation (Non-Medical): No  Physical Activity: Not on file  Stress: Not on file  Social Connections: Not on file    Allergies:  Allergies  Allergen Reactions  . Sulfa Antibiotics Itching    Objective:    Vital Signs:   Temp:  [97.6 F (36.4 C)-98.6 F (37 C)] 98.6 F (37 C) (05/14 0800) Pulse Rate:  [85-97] 88 (05/14 0900) Resp:  [8-28] 16 (05/14 0900) BP: (87-131)/(50-95) 131/68 (05/14 0900) SpO2:  [88 %-100 %] 90 % (05/14 0900) Weight:  [137.1 kg] 137.1 kg (05/14 0500) Last BM Date: 12/28/20  Weight change: Filed Weights   12/30/20 0545 12/31/20 0500 01/01/21 0500  Weight: 131.8 kg (!) 136.2 kg (!) 137.1 kg    Intake/Output:   Intake/Output Summary (Last 24 hours) at 01/01/2021 1001 Last data filed at 01/01/2021 0900 Gross per 24 hour  Intake 1022.99 ml  Output 530 ml  Net 492.99 ml      Physical Exam    General:  Sitting in chair No resp difficulty HEENT: normal Neck: supple. JVP to ear  Carotids 2+ bilat; no bruits. No lymphadenopathy or thyromegaly appreciated. Cor: Surgical wound ok PMI nondisplaced. Regular rate & rhythm. 2/6 SEM LUSB Lungs:  clear Abdomen: obese soft, nontender, nondistended. No hepatosplenomegaly. No bruits or masses. Good bowel sounds. Extremities: no cyanosis, clubbing, rash, tr edema Neuro: alert & orientedx3, cranial nerves grossly intact. moves all 4 extremities w/o difficulty. Affect pleasant   Telemetry   NSR 80-90s Personally reviewed  Labs   Basic Metabolic Panel: Recent Labs  Lab 12/29/20 2117 12/29/20 2318 12/30/20 0050 12/30/20 0414 12/30/20 1641 12/31/20 0306 01/01/21 0353  NA 138   < > 141 136 133* 134* 130*  K 4.8   < > 4.4 4.6 4.3 4.3 4.6  CL 107  --   --  102 100 99 96*  CO2 24  --   --  _0 GLUCOSE 157*  --   --  161* 178* 127* 196*  BUN 30*  --   --  30* 32* 35* 45*  CREATININE 1.94*  --   --  2.00* 2.55* 3.03* 4.11*  CALCIUM 8.7*  --   --  8.9 9.1 9.4 9.1  MG 3.6*  --   --  3.1* 3.1*  --   --    < > = values in this interval not displayed.    Liver Function Tests: No results for input(s): AST, ALT, ALKPHOS, BILITOT, PROT, ALBUMIN in the last 168 hours. No results for input(s): LIPASE, AMYLASE in the last 168 hours. No results for input(s): AMMONIA in the last 168 hours.  CBC: Recent Labs  Lab 12/26/20 0142 12/27/20 0435 12/29/20 2117 12/29/20 2318 12/30/20 0050 12/30/20 0414 12/30/20 1641 12/31/20 0306 01/01/21 0353  WBC 8.6   < > 17.9*  --   --  19.0* 21.6* 22.6* 20.2*  NEUTROABS 4.3  --   --   --   --   --   --   --   --   HGB 13.1   < > 10.9*   < > 10.5* 10.1* 9.5* 9.3* 8.2*  HCT 39.8   < > 34.7*   < > 31.0* 31.6* 30.9* 29.4* 26.0*  MCV 88.1   < > 89.4  --   --  89.0 92.0 91.0 91.5  PLT 240   < > 142*  --   --  123* 126* 120* 144*   < > = values in this interval not displayed.    Cardiac Enzymes: No results for input(s): CKTOTAL, CKMB, CKMBINDEX, TROPONINI in the last 168 hours.  BNP: BNP (last 3 results) Recent Labs    03/02/20 1527 11/22/20 1527 12/19/20 1016  BNP 43.7 132.8* 227.9*    ProBNP (last 3 results) No results for  input(s): PROBNP in the last 8760 hours.   CBG: Recent Labs  Lab 12/31/20 1301 12/31/20 1633 12/31/20 2019 12/31/20 2210 01/01/21 0658  GLUCAP 157* 216* 192* 170* 197*    Coagulation Studies: Recent Labs    12/29/20 1547  LABPROT 21.6*  INR 1.9*     Imaging   DG Chest Port 1 View  Result Date: 01/01/2021 CLINICAL DATA:  Status post mitral valve repair EXAM: PORTABLE CHEST 1 VIEW COMPARISON:  12/31/2020 FINDINGS: Right arm PICC line tip is in the projection of the cavoatrial junction. Status post median sternotomy. Left atrial appendage clip and prosthetic mitral valve are identified. Stable mild cardiac enlargement. No pleural effusion or edema. No airspace densities identified. IMPRESSION: Postoperative change as described above.  No complications. Electronically Signed   By: Kerby Moors M.D.   On: 01/01/2021 09:13      Medications:     Current Medications: . acetaminophen  1,000 mg Oral Q6H   Or  . acetaminophen (TYLENOL) oral liquid 160 mg/5 mL  1,000 mg Per Tube Q6H  . acetaminophen (TYLENOL) oral liquid 160 mg/5 mL  650 mg Per Tube Once   Or  . acetaminophen  650 mg Rectal Once  . amiodarone  200 mg Oral Daily  . aspirin EC  81 mg Oral Daily  . bisacodyl  10 mg Oral Daily   Or  . bisacodyl  10 mg Rectal Daily  . Chlorhexidine Gluconate Cloth  6 each Topical Daily  . digoxin  0.0625 mg Oral Daily  . docusate sodium  200 mg Oral Daily  . DULoxetine  60 mg Oral Daily  . enoxaparin (LOVENOX) injection  30 mg Subcutaneous QHS  . furosemide  40 mg Intravenous Q8H  . gabapentin  600 mg Oral BID  . insulin aspart  0-20 Units Subcutaneous TID WC  . insulin aspart  0-5 Units Subcutaneous QHS  . insulin aspart  3 Units Subcutaneous TID WC  . insulin detemir  40 Units Subcutaneous BID  . mouth rinse  15 mL Mouth Rinse BID  . pantoprazole  40 mg Oral Daily  . rosuvastatin  10 mg Oral Daily  . sodium chloride flush  10-40 mL Intracatheter Q12H  . sodium  chloride flush  3 mL Intravenous Q12H     Infusions: . sodium chloride Stopped (12/30/20 1019)  . sodium chloride    . sodium chloride 20 mL/hr at 12/29/20 1533  . dexmedetomidine (PRECEDEX) IV infusion 0.7 mcg/kg/hr (12/29/20 1520)  . lactated ringers    . lactated ringers Stopped (  12/31/20 1720)  . milrinone 0.125 mcg/kg/min (01/01/21 0900)  . nitroGLYCERIN Stopped (12/29/20 1520)  . norepinephrine (LEVOPHED) Adult infusion Stopped (12/30/20 0525)  . phenylephrine (NEO-SYNEPHRINE) Adult infusion    . vasopressin Stopped (12/30/20 1138)      Assessment/Plan   1. Valvular heart disease s/p MVR/TVR  on 5/12 - Severe TRs/p annuloplasty. Severe MRs/p annuloplasty on 12/30/20  2. Acute on chronic systolic HF - TEE 8/36 EF 45-50% RV moderately HK  - co-ox 63% -> 49%  - remains volume overloaded. Weigh up 17 pounds from baseline - Creatinine rising 1.9 -> 4.0 - Increase milrinone to 0.25  - Start lasix gtt at 20. Will likely need CVVHD for volume removal  - repeat echo   3. AKI on CKD 3b post-op  - baseline SCr 1.6-1.8 - Scr 4.1 today. Likely post-op ATN - Start lasix gtt. Given rate of SCr rise, suspect she will likely need CVVHD for volume removal  - Keep MAP >= 70  4. Longstanding Persistent Atrial Fibrillation s/p Maze - CHADSVASC 4+  - ASA 324 per TCTS - stop digoxin with AKI  - continue low dose amiodarone 200 mg PO Daily - AC per TCTS  5. Morbid Obesity - needs weight loss  6. DM2 - SSI    Length of Stay: 38  Glori Bickers, MD  01/01/2021, 10:01 AM  Advanced Heart Failure Team Pager [phone] (M-F; 7a - 5p)  Please contact [facility] for night-coverage after hours (4p -7a ) and weekends on [email or web address]

## 2021-01-01 NOTE — Consult Note (Signed)
Renal Service Consult Note [facility]  [name] 01/01/2021 Sol Blazing, MD Requesting Physician: Dr. Roxan Hockey, Chauncey Cruel.   Reason for Consult: Renal failure HPI: The patient is a 65 y.o. year-old w/ hx of OSA, HTN, HL, DVT, DM2, COPD, chronic diast CHF admitted 5/01/ 22 for CHF exacerbation.  Seen by cardiology who recommended IV lasix 80 bid. She was found to have severe MR/ TR by echo w/ reduced LVEF 45-50%. TCTS was consulted and recommended MV repair, TV repair and Maze procedure. On 5/11 pt underwent the surgery w/ Maze procedure, LA appendage AtriCLip, and TV and MV annuloplasty (repair). Post-op BP's have been normal to slightly low. Pt has rec'd IV lasix 73m four doses over the last 2 days and UOP has dropped off w/ rising creatinine. Asked to see for renal failure.   Pre-op pt was getting IV milrinone and this has continued to today.  Post-op she required about 12- 18 hrs of pressor support weaned off by early 5/12.   Pt up in chair, no SOB at rest, did get SOB walking today. No orthopnea. Has a foley in .   ROS  denies CP  no joint pain   no HA  no blurry vision  no rash  no diarrhea  no nausea/ vomiting    Past Medical History  Past Medical History:  Diagnosis Date  . Allergic rhinitis   . Arthritis   . Asthma   . Chest pain 12/27/2017  . Chronic diastolic CHF (congestive heart failure) ([place]   . COPD (chronic obstructive pulmonary disease) ([place]   . Depression   . DM (diabetes mellitus) ([place]   . DVT (deep vein thrombosis) in pregnancy   . Dysrhythmia    atrial fibrilation  . GERD (gastroesophageal reflux disease)   . Headache(784.0)   . HTN (hypertension)   . Hyperlipidemia   . Obesity   . Pneumonia 04/2018   RIGHT LOBE  . Shortness of breath   . Sleep apnea    compliant with CPAP   Past Surgical History  Past Surgical History:  Procedure Laterality Date  . ABDOMINAL HYSTERECTOMY    . BUBBLE STUDY  11/26/2020    Procedure: BUBBLE STUDY;  Surgeon: CBuford Dresser MD;  [place]  Service: Cardiovascular;;  . CARDIOVERSION N/A 05/06/2020   Procedure: CARDIOVERSION;  Surgeon: CBuford Dresser MD;  [facility];  Service: Cardiovascular;  Laterality: N/A;  . CARDIOVERSION N/A 11/04/2020   Procedure: CARDIOVERSION;  Surgeon: CLelon Perla MD;  [facility];  Service: Cardiovascular;  Laterality: N/A;  . CATARACT EXTRACTION, BILATERAL    . CHOLECYSTECTOMY    . COLONOSCOPY WITH PROPOFOL N/A 03/05/2015   Procedure: COLONOSCOPY WITH PROPOFOL;  Surgeon: PCarol Ada MD;  Location: [facility]OSCOPY;  Service: Endoscopy;  Laterality: N/A;  . DIAGNOSTIC LAPAROSCOPY    . ingrown hallux Left   . KNEE SURGERY    . LEFT HEART CATH AND CORONARY ANGIOGRAPHY N/A 12/31/2017   Procedure: LEFT HEART CATH AND CORONARY ANGIOGRAPHY;  Surgeon: HCharolette Forward MD;  [facility];  Service: Cardiovascular;  Laterality: N/A;  . MAZE N/A 12/29/2020   Procedure: MAZE;  Surgeon: HMelrose Nakayama MD;  [place]  Service: Open Heart Surgery;  Laterality: N/A;  . MITRAL VALVE REPAIR N/A 12/29/2020   Procedure: MITRAL VALVE REPAIR USING CARBOMEDICS ANNULOFLEX RING SIZE 30;  Surgeon: HMelrose Nakayama MD;  [place]  Service: Open Heart Surgery;  Laterality: N/A;  .  RIGHT HEART CATH N/A 12/22/2020   Procedure: RIGHT HEART CATH;  Surgeon: Larey Dresser, MD;  Location: [place] [facility];  Service: Cardiovascular;  Laterality: N/A;  . TEE WITHOUT CARDIOVERSION N/A 11/26/2020   Procedure: TRANSESOPHAGEAL ECHOCARDIOGRAM (TEE);  Surgeon: Buford Dresser, MD;  Location[facility]COPY;  Service: Cardiovascular;  Laterality: N/A;  . TEE WITHOUT CARDIOVERSION N/A 12/29/2020   Procedure: TRANSESOPHAGEAL ECHOCARDIOGRAM (TEE);  Surgeon: [name], MD;  Location: [place];  Service: Open Heart Surgery;  Laterality: N/A;  . TRICUSPID VALVE REPLACEMENT N/A  12/29/2020   Procedure: 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 RING SIZE 34;  Surgeon: [name], MD;  Location: [place];  Service: Open Heart Surgery;  Laterality: N/A;   Family History  Family History  Problem Relation Age of Onset  . Cancer Mother   . Hypertension Mother   . Cancer Father   . CAD Other   . Hypertension Sister   . Hypertension Brother    Social History  reports that she has never smoked. She has never used smokeless tobacco. She reports that she does not drink alcohol and does not use drugs. Allergies  Allergies  Allergen Reactions  . Sulfa Antibiotics Itching   Home medications Prior to Admission medications   Medication Sig Start Date End Date Taking? Authorizing Provider  Accu-Chek Softclix Lancets lancets Use as instructed to check blood sugar three times daily. E11.69 04/21/20  Yes Newlin, Charlane Ferretti, MD  albuterol (VENTOLIN HFA) 108 (90 Base) MCG/ACT inhaler Inhale 2 puffs into the lungs every 4 (four) hours as needed for wheezing or shortness of breath.  08/15/19  Yes [provider]  Alcohol Swabs (B-D SINGLE USE SWABS REGULAR) PADS  02/19/19  Yes [provider]  amiodarone (PACERONE) 200 MG tablet Take 1 tablet (200 mg total) by mouth 2 (two) times daily. 11/05/20  Yes Baldwin Jamaica, PA-C  apixaban (ELIQUIS) 5 MG TABS tablet Take 1 tablet (5 mg total) by mouth 2 (two) times daily. 11/09/20  Yes Buford Dresser, MD  aspirin-sod bicarb-citric acid (ALKA-SELTZER) 325 MG TBEF tablet Take 325 mg by mouth every 6 (six) hours as needed (indigestion).   Yes [provider]  Blood Glucose Monitoring Suppl (ACCU-CHEK GUIDE ME) w/Device KIT Use as instructed to check blood sugar three times daily. E11.69 04/21/20  Yes Charlott Rakes, MD  Blood Glucose Monitoring Suppl (ACCU-CHEK GUIDE) w/Device KIT USE AS INSTRUCTED TO CHECK BLOOD SUGAR THREE TIMES DAILY. E11.69 04/21/20  Yes [provider]  digoxin (LANOXIN)  0.125 MG tablet Take 0.125 mg by mouth daily.  11/04/19  Yes [provider]  DULoxetine (CYMBALTA) 60 MG capsule Take 1 capsule (60 mg total) by mouth daily. 09/24/20  Yes [name], NP  gabapentin (NEURONTIN) 300 MG capsule Take 2 capsules (600 mg total) by mouth 2 (two) times daily. 09/24/20  Yes [name], NP  insulin aspart (NOVOLOG) 100 UNIT/ML injection Inject 12 Units into the skin 3 (three) times daily before meals. For blood sugars 0-199 give 0 units of insulin, 201-250 give 4 units, 251-300 give 6 units, 301-350 give 8 units, 351-400 give 10 units,> 400 give 12 units and call M.D. Patient taking differently: Inject 5 Units into the skin 3 (three) times daily before meals. For blood sugars 0-199 give 0 units of insulin, 201-250 give 4 units, 251-300 give 6 units, 301-350 give 8 units, 351-400 give 7. 06/24/20  Yes [name], NP  LEVEMIR 100 UNIT/ML  injection INJECT 0.4 MLS (40 UNITS TOTAL) INTO THE SKIN 2 (TWO) TIMES DAILY. Patient taking differently: Inject 50 Units into the skin 2 (two) times daily. 12/01/20  Yes [name], NP  Multiple Vitamins-Minerals (MULTIVITAMIN WOMEN PO) Take 1 tablet by mouth daily.    Yes [provider]  nitroGLYCERIN (NITROSTAT) 0.4 MG SL tablet Place 1 tablet (0.4 mg total) under the tongue every 5 (five) minutes x 3 doses as needed for chest pain. 01/08/14  Yes Charolette Forward, MD  TRUE METRIX BLOOD GLUCOSE TEST test strip USE AS INSTRUCTED TO CHECK BLOOD SUGAR DAILY. 07/21/20  Yes Newlin, Charlane Ferretti, MD  TRULICITY 1.5 IF/0.2DX SOPN INJECT 1.5 MG INTO THE SKIN ONCE A WEEK. 12/05/20  Yes [name], NP  EPINEPHrine 0.3 mg/0.3 mL IJ SOAJ injection Inject 0.3 mg into the muscle as needed for anaphylaxis. Patient taking differently: Inject 0.3 mg into the muscle as needed for anaphylaxis. Never used 06/25/20   [name], NP  metoprolol succinate (TOPROL-XL) 100 MG 24 hr tablet Take 100 mg by mouth in the  morning and at bedtime. Take with or immediately following a meal.    [provider]  Potassium Chloride ER 20 MEQ TBCR Take 20 mEq by mouth daily. 11/05/20   Baldwin Jamaica, PA-C  RESTASIS 0.05 % ophthalmic emulsion Place 1 drop into both eyes 2 (two) times daily. 11/19/17   [provider]  rosuvastatin (CRESTOR) 10 MG tablet Take 1 tablet (10 mg total) by mouth daily. 09/24/20   [name], NP  torsemide 40 MG TABS Take 40 mg by mouth 2 (two) times daily. 11/22/20 02/20/21  Buford Dresser, MD     Vitals:   01/01/21 0900 01/01/21 1000 01/01/21 1100 01/01/21 1139  BP: 131/68 140/69 131/65   Pulse: 88 88 87   Resp: _0 Temp:    98 F (36.7 C)  TempSrc:    Axillary  SpO2: 90% 92% 92%   Weight:      Height:       Exam Gen alert, no distress, obese AAF no distress, up in chair No rash, cyanosis or gangrene Sclera anicteric, throat clear  No jvd or bruits Chest clear bilat to bases, no rales/ wheezing RRR no MRG Abd soft ntnd no mass or ascites +bs GU w/ foley draining clear yellowish urine MS no joint effusions or deformity Ext no pitting LE edema, no wounds or ulcers Neuro is alert, Ox 3 , nf       Home meds:  - amiodarone 200 bid/ eliquis 5 bid/ digoxin 0.125 qd/ sl ntg prn/ crestor 10  - torsemide 40 bid/ metoprolol xl 100 bid/ kdur 20 qd  - trulicity 4.1OI sq weekly/ levemir 50u bid/ insulin aspart 0-7 units ac tid  - neurontin 600 bid/ cymbalta 60 qd  - prn's/ vitamins/ supplements     Date   Creat  eGFR   2011- may 2019 1.1-1.5   Sept 2019 - jul 2020 1.41- 1.67   2021   1.46- 1.7 AKI's 2.4 >> 1.7 and 2.09 >> 1.50   Jan- April 2022 1.50- 1.84 33- 39 ml/min, IIIB   Dec 19, 2020  1.68  34   May 10  2.04   May 12  2.0  27   May 13  3.03  17   May 14  4.11  12        ECHO 11/26/20 - LVEF 45-50%, RV mod reduced fxn,  severe LAE/ RAE, severe MR, severe TR    CXR 5/14 - IMPRESSION: Right arm PICC line tip is in the projection of the  cavoatrial junction. Status post median sternotomy. Left atrial appendage clip and prosthetic mitral valve are identified. Stable mild cardiac enlargement. No pleural effusion or edema. No airspace densities identified.     Total I/O since admit 5/01 = 20 L in and 35 L out = net -15 L     Admit wt 129kg, peak 136 kg on 5/13 and today 137kg     UA 5/10 - negative   Assessment/ Plan: 1. AKI on CKD 3B - b/l creat 1.5- 1.8 from early 2022, egfr 33- 70m/min. Pt here for decomp HF related to EF 45-50% and severe MV/ TV disease. Was diuresed then went for valve surgery x 2 (+Maze) on 5/11 three days ago. AKI now post-op w/ declining UOP and creat up to 3.0 yest and 4.1 today (b/l creat here 1.6- 2.0) . Has not responded to IV lasix. Up 10- 15kg from pre-op wt. CXR w/o edema. Remains on milrinone. Suspect post-op ATN. Get renal [place] urine lytes. Seen by CHF team today and started on IV lasix gtt 20 mg /hr. Agree w/ lasix gtt. No indication for RRT yet. If doesn't improve may need CRRT soon.  2. A/C systolic CHF 3. Valvular heart disease s/p MV / TV repair on 5/12 4. Atrial fibrillation - sp Maze 5/12, getting asa and amio po 5. Morbid obesity 6. DM2      Rob Veta Dambrosia  MD 01/01/2021, 12:02 PM  Recent Labs  Lab 12/31/20 0306 01/01/21 0353  WBC 22.6* 20.2*  HGB 9.3* 8.2*   Recent Labs  Lab 12/31/20 0306 01/01/21 0353  K 4.3 4.6  BUN 35* 45*  CREATININE 3.03* 4.11*  CALCIUM 9.4 9.1

## 2021-01-01 NOTE — Progress Notes (Signed)
TCTS Evening Rounds  Stable day hemodyn Appreciate AHF/nephro consultation  PE: BP 123/71   Pulse 88   Temp 97.8 F (36.6 C) (Oral)   Resp 16   Ht 5\' 7"  (1.702 m)   Wt (!) 137.1 kg   SpO2 94%   BMI 47.34 kg/m  Resting comfortably Exam unremarkable   Intake/Output Summary (Last 24 hours) at 01/01/2021 1756 Last data filed at 01/01/2021 1700 Gross per 24 hour  Intake 1136.5 ml  Output 465 ml  Net 671.5 ml    A/p: Continue present management Repeat labs Echo pending  [name], [place]

## 2021-01-01 NOTE — Progress Notes (Signed)
3 Days Post-Op Procedure(s) (LRB): MITRAL VALVE REPAIR USING CARBOMEDICS ANNULOFLEX RING SIZE 30 (N/A) 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 RING SIZE 34 (N/A) MAZE (N/A) TRANSESOPHAGEAL ECHOCARDIOGRAM (TEE) (N/A) Subjective: Tired, but no other complaints  Objective: Vital signs in last 24 hours: Temp:  [97.6 F (36.4 C)-98.6 F (37 C)] 98.6 F (37 C) (05/14 0800) Pulse Rate:  [85-97] 88 (05/14 0900) Cardiac Rhythm: Normal sinus rhythm;Heart block (05/14 0800) Resp:  [8-28] 16 (05/14 0900) BP: (87-131)/(50-95) 131/68 (05/14 0900) SpO2:  [88 %-100 %] 90 % (05/14 0900) Weight:  [137.1 kg] 137.1 kg (05/14 0500)  Hemodynamic parameters for last 24 hours:    Intake/Output from previous day: 05/13 0701 - 05/14 0700 In: 930.2 [P.O.:360; I.V.:378.9; IV Piggyback:191.3] Out: 490 [Urine:490] Intake/Output this shift: Total I/O In: 252.4 [P.O.:240; I.V.:12.4] Out: -   General appearance: alert and cooperative Neurologic: intact Heart: regular rate and rhythm, S1, S2 normal, no murmur, click, rub or gallop Lungs: clear to auscultation bilaterally Abdomen: soft, non-tender; bowel sounds normal; no masses,  no organomegaly Extremities: edema 2+ Wound: c/d/i  Lab Results: Recent Labs    12/31/20 0306 01/01/21 0353  WBC 22.6* 20.2*  HGB 9.3* 8.2*  HCT 29.4* 26.0*  PLT 120* 144*   BMET:  Recent Labs    12/31/20 0306 01/01/21 0353  NA 134* 130*  K 4.3 4.6  CL 99 96*  CO2 25 22  GLUCOSE 127* 196*  BUN 35* 45*  CREATININE 3.03* 4.11*  CALCIUM 9.4 9.1    PT/INR:  Recent Labs    12/29/20 1547  LABPROT 21.6*  INR 1.9*   ABG    Component Value Date/Time   PHART 7.368 12/30/2020 0050   HCO3 23.0 12/30/2020 0050   TCO2 24 12/30/2020 0050   ACIDBASEDEF 2.0 12/30/2020 0050   O2SAT 49.0 01/01/2021 0353   CBG (last 3)  Recent Labs    12/31/20 2019 12/31/20 2210 01/01/21 0658  GLUCAP 192* 170* 197*    Assessment/Plan: S/P Procedure(s)  (LRB): MITRAL VALVE REPAIR USING CARBOMEDICS ANNULOFLEX RING SIZE 30 (N/A) 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 RING SIZE 34 (N/A) MAZE (N/A) TRANSESOPHAGEAL ECHOCARDIOGRAM (TEE) (N/A) Mobilize Diuresis echocardogram; consider using FloTrack to assess hemodyn  Nephrology AHF   LOS: 11 days    Wonda Olds 01/01/2021

## 2021-01-02 ENCOUNTER — Inpatient Hospital Stay (HOSPITAL_COMMUNITY): Payer: Medicare Other

## 2021-01-02 DIAGNOSIS — I361 Nonrheumatic tricuspid (valve) insufficiency: Secondary | ICD-10-CM

## 2021-01-02 DIAGNOSIS — Z9889 Other specified postprocedural states: Secondary | ICD-10-CM | POA: Diagnosis not present

## 2021-01-02 DIAGNOSIS — I5023 Acute on chronic systolic (congestive) heart failure: Secondary | ICD-10-CM | POA: Diagnosis not present

## 2021-01-02 LAB — ECHOCARDIOGRAM LIMITED
AR max vel: 1.3 cm2
AV Area VTI: 1.25 cm2
AV Area mean vel: 1.19 cm2
AV Mean grad: 13 mmHg
AV Peak grad: 25.2 mmHg
Ao pk vel: 2.51 m/s
Height: 67 in
MV VTI: 0.99 cm2
S' Lateral: 3.9 cm
Weight: 4836.01 oz

## 2021-01-02 LAB — COMPREHENSIVE METABOLIC PANEL
ALT: <5 U/L (ref 0–44)
AST: 30 U/L (ref 15–41)
Albumin: 3.5 g/dL (ref 3.5–5.0)
Alkaline Phosphatase: 93 U/L (ref 38–126)
Anion gap: 12 (ref 5–15)
BUN: 56 mg/dL — ABNORMAL HIGH (ref 8–23)
CO2: 24 mmol/L (ref 22–32)
Calcium: 9.4 mg/dL (ref 8.9–10.3)
Chloride: 93 mmol/L — ABNORMAL LOW (ref 98–111)
Creatinine, Ser: 4.36 mg/dL — ABNORMAL HIGH (ref 0.44–1.00)
GFR, Estimated: 11 mL/min — ABNORMAL LOW (ref >60)
Glucose, Bld: 168 mg/dL — ABNORMAL HIGH (ref 70–99)
Potassium: 4.2 mmol/L (ref 3.5–5.1)
Sodium: 129 mmol/L — ABNORMAL LOW (ref 135–145)
Total Bilirubin: 0.9 mg/dL (ref 0.3–1.2)
Total Protein: 7.5 g/dL (ref 6.5–8.1)

## 2021-01-02 LAB — CBC
HCT: 25.6 % — ABNORMAL LOW (ref 36.0–46.0)
Hemoglobin: 8.2 g/dL — ABNORMAL LOW (ref 12.0–15.0)
MCH: 28.8 pg (ref 26.0–34.0)
MCHC: 32 g/dL (ref 30.0–36.0)
MCV: 89.8 fL (ref 80.0–100.0)
Platelets: 178 10*3/uL (ref 150–400)
RBC: 2.85 MIL/uL — ABNORMAL LOW (ref 3.87–5.11)
RDW: 14.4 % (ref 11.5–15.5)
WBC: 17.8 10*3/uL — ABNORMAL HIGH (ref 4.0–10.5)
nRBC: 0 % (ref 0.0–0.2)

## 2021-01-02 LAB — COOXEMETRY PANEL
Carboxyhemoglobin: 1.5 % (ref 0.5–1.5)
Methemoglobin: 1.1 % (ref 0.0–1.5)
O2 Saturation: 61.3 %
Total hemoglobin: 9.2 g/dL — ABNORMAL LOW (ref 12.0–16.0)

## 2021-01-02 LAB — GLUCOSE, CAPILLARY
Glucose-Capillary: 157 mg/dL — ABNORMAL HIGH (ref 70–99)
Glucose-Capillary: 135 mg/dL — ABNORMAL HIGH (ref 70–99)
Glucose-Capillary: 155 mg/dL — ABNORMAL HIGH (ref 70–99)
Glucose-Capillary: 170 mg/dL — ABNORMAL HIGH (ref 70–99)

## 2021-01-02 MED ORDER — METOLAZONE 5 MG PO TABS
5.0000 mg | ORAL_TABLET | Freq: Once | ORAL | Status: AC
  Administered 2021-01-02: 5 mg via ORAL
  Filled 2021-01-02: qty 1

## 2021-01-02 MED ORDER — SODIUM CHLORIDE 0.9 % IV SOLN
6.2500 mg | Freq: Four times a day (QID) | INTRAVENOUS | Status: DC | PRN
  Administered 2021-01-02 – 2021-01-03 (×2): 6.25 mg via INTRAVENOUS
  Filled 2021-01-02 (×8): qty 0.25

## 2021-01-02 MED ORDER — GABAPENTIN 300 MG PO CAPS
300.0000 mg | ORAL_CAPSULE | Freq: Two times a day (BID) | ORAL | Status: DC
  Administered 2021-01-02: 300 mg via ORAL

## 2021-01-02 NOTE — Progress Notes (Signed)
Pt already on CPAP.

## 2021-01-02 NOTE — Progress Notes (Signed)
  Echocardiogram 2D Echocardiogram has been performed.  [name] 01/02/2021, 9:56 AM

## 2021-01-02 NOTE — Progress Notes (Signed)
TCTS Evening Rounds  POD # 4 s/p MVr/TVr/maze  Feeling better Good UOP C/o "shakiness"  PE: BP 134/73   Pulse 93   Temp 97.9 F (36.6 C)   Resp (!) 9   Ht 5\' 7"  (1.702 m)   Wt (!) 137.1 kg   SpO2 100%   BMI 47.34 kg/m  RRR CTA C/d/i   Intake/Output Summary (Last 24 hours) at 01/02/2021 1926 Last data filed at 01/02/2021 1800 Gross per 24 hour  Intake 2112.06 ml  Output 1420 ml  Net 692.06 ml   Echo with mild MR, mod TR, good bi-V function, no effusion A/P: Continue current plan Kunta Hilleary Z. Orvan Seen, [place]

## 2021-01-02 NOTE — Progress Notes (Signed)
[facility] Progress Note  Subjective: seen in room, no new c/o's. UOP better today 1.1 L out so far. 350 cc out yest. Creat up from 4.1 to  4.3 today.   Vitals:   01/02/21 1325 01/02/21 1400 01/02/21 1500 01/02/21 1545  BP:  127/74    Pulse: 97 92 97 95  Resp: _0 Temp:      TempSrc:      SpO2: 97% 100% 100% 98%  Weight:      Height:        Exam: Gen alert, no distress, obese AAF no distress, up in chair No rash, cyanosis or gangrene Sclera anicteric, throat clear  No jvd or bruits Chest clear bilat to bases, no rales/ wheezing RRR no MRG Abd soft ntnd no mass or ascites +bs GU w/ foley draining clear yellowish urine MS no joint effusions or deformity Ext no pitting LE edema, no wounds or ulcers Neuro is alert, Ox 3 , nf, mild asterixis       Home meds:  - amiodarone 200 bid/ eliquis 5 bid/ digoxin 0.125 qd/ sl ntg prn/ crestor 10  - torsemide 40 bid/ metoprolol xl 100 bid/ kdur 20 qd  - trulicity 4.0HW sq weekly/ levemir 50u bid/ insulin aspart 0-7 units ac tid  - neurontin 600 bid/ cymbalta 60 qd  - prn's/ vitamins/ supplements     Date                           Creat               eGFR   2011- may 2019        1.1-1.5   Sept 2019 - jul 2020  1.41- 1.67   2021                          1.46- 1.7          AKI's 2.4 >> 1.7 and 2.09 >> 1.50   Jan- April 2022          1.50- 1.84        33- 39 ml/min, IIIB   Dec 19, 2020              1.68                 34   May 10                      2.04   May 12                      2.0                   27   May 13                      3.03                 17   May 14                      4.11                 12        ECHO 11/26/20 - LVEF 45-50%, RV mod reduced fxn, severe LAE/ RAE, severe MR, severe TR  CXR 5/14 - IMPRESSION: Right arm PICC line tip is in the projection of the cavoatrial junction. Status post median sternotomy. Left atrial appendage clip and prosthetic mitral valve are identified.  Stable mild cardiac enlargement. No pleural effusion or edema. No airspace densities identified.     UA 5/10 - negative    UNa <10, UCr 166       Renal US > 10- 11 cm kidneys w/o hydro or ^'d echo.   Assessment/ Plan: 1. AKI on CKD 3B - b/l creat 1.5- 1.8 from early 2022, egfr 33- 432m/min. Pt here for decomp HF related to EF 45-50% and severe MV/ TV disease. Pt was diuresed, then went for valve surgery x 2 (+Maze) on 12/29/20.  Developed AKI post-op w/ declining UOP and creat up to 4.1 yesterday. Renal UKoreaw/o obstruction and urine lytes c/w CHF. Suspect post-op ATN. UOP was dropping so CHF team started IV lasix gtt 27mhr yesterday and today UOP is improving. Creat bump today to 4.3, mild increase. Today has mild asterixis but suspect this is gabapentin not uremia, have dc'd gabapentin.  Up 10- 15kg from pre-op wt. CXR w/o edema. Remains on milrinone. No indication for RRT yet. Will follow closely.   2. A/C systolic CHF 3. Valvular heart disease s/p MV / TV repair on 5/12 4. Atrial fibrillation - sp Maze 5/12, getting asa and amio po 5. Morbid obesity 6. DM2    Rob Kyle Luppino 01/02/2021, 4:15 PM   Recent Labs  Lab 01/01/21 0353 01/02/21 0433  K 4.6 4.2  BUN 45* 56*  CREATININE 4.11* 4.36*  CALCIUM 9.1 9.4  HGB 8.2* 8.2*   Inpatient medications: . acetaminophen  1,000 mg Oral Q6H   Or  . acetaminophen (TYLENOL) oral liquid 160 mg/5 mL  1,000 mg Per Tube Q6H  . acetaminophen (TYLENOL) oral liquid 160 mg/5 mL  650 mg Per Tube Once   Or  . acetaminophen  650 mg Rectal Once  . amiodarone  200 mg Oral Daily  . aspirin EC  81 mg Oral Daily  . bisacodyl  10 mg Oral Daily   Or  . bisacodyl  10 mg Rectal Daily  . Chlorhexidine Gluconate Cloth  6 each Topical Daily  . docusate sodium  200 mg Oral Daily  . DULoxetine  60 mg Oral Daily  . enoxaparin (LOVENOX) injection  30 mg Subcutaneous QHS  . insulin aspart  0-20 Units Subcutaneous TID WC  . insulin aspart  0-5 Units Subcutaneous  QHS  . insulin aspart  3 Units Subcutaneous TID WC  . insulin detemir  40 Units Subcutaneous BID  . mouth rinse  15 mL Mouth Rinse BID  . pantoprazole  40 mg Oral Daily  . rosuvastatin  10 mg Oral Daily   . sodium chloride Stopped (12/30/20 1019)  . sodium chloride    . sodium chloride 20 mL/hr at 12/29/20 1533  . furosemide (LASIX) 200 mg in dextrose 5% 100 mL (32m732mL) infusion 30 mg/hr (01/02/21 1541)  . lactated ringers    . lactated ringers Stopped (12/31/20 1720)  . milrinone 0.25 mcg/kg/min (01/02/21 1500)  . norepinephrine (LEVOPHED) Adult infusion Stopped (12/30/20 0525)  . phenylephrine (NEO-SYNEPHRINE) Adult infusion    . promethazine (PHENERGAN) injection (IM or IVPB) Stopped (01/02/21 1134)  . vasopressin Stopped (12/30/20 1138)   sodium chloride, dextrose, metoprolol tartrate, morphine injection, ondansetron (ZOFRAN) IV, oxyCODONE, promethazine (PHENERGAN) injection (IM or IVPB), sodium chloride flush, sodium chloride flush, traMADol

## 2021-01-02 NOTE — Progress Notes (Signed)
Advanced Heart Failure Rounding Note   Subjective:     Remains on milrinone 0.25. On lasix gtt at 20. Got metolazone $RemoveBefore'5mg'pTnpsobABgnLc$  x 1 lat night. Made about 1L of urine. CVP 25.   Scr seems to be leveling off 4.1 -> 4.36. Weight stable overnight. Co-ox 61%. No uremic symptoms. Bicarb 24.   Denies SOB at ret. + orthopnea.   Echo this am LVEF 55% RV moderately HK. Moderate TR. No significant MR. No effusion .   Objective:   Weight Range:  Vital Signs:   Temp:  [97.6 F (36.4 C)-98.2 F (36.8 C)] 98.2 F (36.8 C) (05/15 0737) Pulse Rate:  [48-94] 94 (05/15 0600) Resp:  [8-25] 25 (05/15 0800) BP: (113-154)/(58-130) 114/61 (05/15 0600) SpO2:  [89 %-100 %] 100 % (05/15 0600) Weight:  [137.1 kg] 137.1 kg (05/15 0600) Last BM Date: 12/28/20  Weight change: Filed Weights   12/31/20 0500 01/01/21 0500 01/02/21 0600  Weight: (!) 136.2 kg (!) 137.1 kg (!) 137.1 kg    Intake/Output:   Intake/Output Summary (Last 24 hours) at 01/02/2021 0935 Last data filed at 01/02/2021 0800 Gross per 24 hour  Intake 1844.8 ml  Output 925 ml  Net 919.8 ml     Physical Exam: General:  Sitting up in chair No resp difficulty HEENT: normal Neck: supple. JVP to ear  . Carotids 2+ bilat; no bruits. No lymphadenopathy or thryomegaly appreciated. Cor: Sternal wound ok . Regular rate & rhythm. 2/6 SEM LUSB Lungs: clear Abdomen: obese soft, nontender, nondistended. No hepatosplenomegaly. No bruits or masses. Good bowel sounds. Extremities: no cyanosis, clubbing, rash, 2+ edema Neuro: alert & orientedx3, cranial nerves grossly intact. moves all 4 extremities w/o difficulty. Affect pleasant  Telemetry: Sinus 90s Personally reviewed  Labs: Basic Metabolic Panel: Recent Labs  Lab 12/29/20 2117 12/29/20 2318 12/30/20 0414 12/30/20 1641 12/31/20 0306 01/01/21 0353 01/02/21 0433  NA 138   < > 136 133* 134* 130* 129*  K 4.8   < > 4.6 4.3 4.3 4.6 4.2  CL 107  --  102 100 99 96* 93*  CO2 24  --  $R'24  25 25 22 24  'Ge$ GLUCOSE 157*  --  161* 178* 127* 196* 168*  BUN 30*  --  30* 32* 35* 45* 56*  CREATININE 1.94*  --  2.00* 2.55* 3.03* 4.11* 4.36*  CALCIUM 8.7*  --  8.9 9.1 9.4 9.1 9.4  MG 3.6*  --  3.1* 3.1*  --   --   --    < > = values in this interval not displayed.    Liver Function Tests: Recent Labs  Lab 01/02/21 0433  AST 30  ALT <5  ALKPHOS 93  BILITOT 0.9  PROT 7.5  ALBUMIN 3.5   No results for input(s): LIPASE, AMYLASE in the last 168 hours. No results for input(s): AMMONIA in the last 168 hours.  CBC: Recent Labs  Lab 12/30/20 0414 12/30/20 1641 12/31/20 0306 01/01/21 0353 01/02/21 0433  WBC 19.0* 21.6* 22.6* 20.2* 17.8*  HGB 10.1* 9.5* 9.3* 8.2* 8.2*  HCT 31.6* 30.9* 29.4* 26.0* 25.6*  MCV 89.0 92.0 91.0 91.5 89.8  PLT 123* 126* 120* 144* 178    Cardiac Enzymes: No results for input(s): CKTOTAL, CKMB, CKMBINDEX, TROPONINI in the last 168 hours.  BNP: BNP (last 3 results) Recent Labs    03/02/20 1527 11/22/20 1527 12/19/20 1016  BNP 43.7 132.8* 227.9*    ProBNP (last 3 results) No results for input(s): PROBNP in  the last 8760 hours.    Other results:  Imaging: DG Chest Port 1 View  Result Date: 01/02/2021 CLINICAL DATA:  History of open heart surgery. History of asthma, CHF. EXAM: PORTABLE CHEST 1 VIEW COMPARISON:  01/01/2021 FINDINGS: RIGHT-sided PICC line tip overlies the superior vena cava. Median sternotomy and valve annuloplasty. Stable cardiomegaly. There is better lung expansion compared to prior study. Minimal residual subsegmental atelectasis at the LEFT lung base. IMPRESSION: Improved aeration.  LEFT LOWER lobe atelectasis. Electronically Signed   By: Nolon Nations M.D.   On: 01/02/2021 08:45   DG Chest Port 1 View  Result Date: 01/01/2021 CLINICAL DATA:  Status post mitral valve repair EXAM: PORTABLE CHEST 1 VIEW COMPARISON:  12/31/2020 FINDINGS: Right arm PICC line tip is in the projection of the cavoatrial junction. Status post  median sternotomy. Left atrial appendage clip and prosthetic mitral valve are identified. Stable mild cardiac enlargement. No pleural effusion or edema. No airspace densities identified. IMPRESSION: Postoperative change as described above.  No complications. Electronically Signed   By: Kerby Moors M.D.   On: 01/01/2021 09:13      Medications:     Scheduled Medications: . acetaminophen  1,000 mg Oral Q6H   Or  . acetaminophen (TYLENOL) oral liquid 160 mg/5 mL  1,000 mg Per Tube Q6H  . acetaminophen (TYLENOL) oral liquid 160 mg/5 mL  650 mg Per Tube Once   Or  . acetaminophen  650 mg Rectal Once  . amiodarone  200 mg Oral Daily  . aspirin EC  81 mg Oral Daily  . bisacodyl  10 mg Oral Daily   Or  . bisacodyl  10 mg Rectal Daily  . Chlorhexidine Gluconate Cloth  6 each Topical Daily  . docusate sodium  200 mg Oral Daily  . DULoxetine  60 mg Oral Daily  . enoxaparin (LOVENOX) injection  30 mg Subcutaneous QHS  . gabapentin  300 mg Oral BID  . insulin aspart  0-20 Units Subcutaneous TID WC  . insulin aspart  0-5 Units Subcutaneous QHS  . insulin aspart  3 Units Subcutaneous TID WC  . insulin detemir  40 Units Subcutaneous BID  . mouth rinse  15 mL Mouth Rinse BID  . pantoprazole  40 mg Oral Daily  . rosuvastatin  10 mg Oral Daily  . sodium chloride flush  10-40 mL Intracatheter Q12H  . sodium chloride flush  3 mL Intravenous Q12H     Infusions: . sodium chloride Stopped (12/30/20 1019)  . sodium chloride    . sodium chloride 20 mL/hr at 12/29/20 1533  . dexmedetomidine (PRECEDEX) IV infusion 0.7 mcg/kg/hr (12/29/20 1520)  . furosemide (LASIX) 200 mg in dextrose 5% 100 mL ($Remov'2mg'ypWprE$ /mL) infusion 20 mg/hr (01/02/21 0800)  . lactated ringers    . lactated ringers Stopped (12/31/20 1720)  . milrinone 0.25 mcg/kg/min (01/02/21 0800)  . nitroGLYCERIN Stopped (12/29/20 1520)  . norepinephrine (LEVOPHED) Adult infusion Stopped (12/30/20 0525)  . phenylephrine (NEO-SYNEPHRINE) Adult  infusion    . vasopressin Stopped (12/30/20 1138)     PRN Medications:  sodium chloride, dextrose, metoprolol tartrate, midazolam, morphine injection, ondansetron (ZOFRAN) IV, oxyCODONE, sodium chloride flush, sodium chloride flush, traMADol   Assessment/Plan:   1. Valvular heart disease s/p MVR/TVR  on 5/12 - Severe TRs/p annuloplasty. Severe MRs/p annuloplasty on 12/30/20 - Echo this am 01/02/21 LVEF 55% RV moderately HK. Moderate TR. No significant MR. No effusion .  2. Acute on chronic systolic HF with R>>L HF - TEE 4/22  EF 45-50% RV moderately HK  - Echo this am LVEF 55% RV moderately HK. Moderate TR. No significant MR. No effusion . - co-ox 61% on milrinone 0.25 - remains volume overloaded. Weigh up 17 pounds from baseline - Creatinine rising but may be plateauing  1.9 -> 4.0 -> 4.36. No indication for HD/CVVHD currently. Hopefully we can avoid. - CVP 25. Making urine on lasix gtt at 20. Will repeat metolazone $RemoveBeforeDEI'5mg'TUqdrkWoxxrIOzKc$  x 1. Increase lasix gtt to 30.  - Wrap legs.   3. AKI on CKD 3b post-op  - baseline SCr 1.6-1.8 - Scr 4.1 -> 4.36 today. Likely post-op ATN - Plan as above - Keep MAP >= 70  4. Longstanding Persistent Atrial Fibrillation s/p Maze - CHADSVASC 4+  - ASA 324 per TCTS -off digoxin with AKI  - continue low dose amiodarone 200 mg PO Daily - AC per TCTS  5. Morbid Obesity - needs weight loss  6. DM2 - SSI   CRITICAL CARE Performed by: Glori Bickers  Total critical care time: 40 minutes  Critical care time was exclusive of separately billable procedures and treating other patients.  Critical care was necessary to treat or prevent imminent or life-threatening deterioration.  Critical care was time spent personally by me (independent of midlevel providers or residents) on the following activities: development of treatment plan with patient and/or surrogate as well as nursing, discussions with consultants, evaluation of patient's response to  treatment, examination of patient, obtaining history from patient or surrogate, ordering and performing treatments and interventions, ordering and review of laboratory studies, ordering and review of radiographic studies, pulse oximetry and re-evaluation of patient's condition.    Length of Stay: 12   Glori Bickers MD 01/02/2021, 9:35 AM  Advanced Heart Failure Team Pager [phone] (M-F; [place])  Please contact [facility] for night-coverage after hours (4p -7a ) and weekends on [email or web address]

## 2021-01-02 NOTE — Progress Notes (Signed)
4 Days Post-Op Procedure(s) (LRB): MITRAL VALVE REPAIR USING CARBOMEDICS ANNULOFLEX RING SIZE 30 (N/A) 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 RING SIZE 34 (N/A) MAZE (N/A) TRANSESOPHAGEAL ECHOCARDIOGRAM (TEE) (N/A) Subjective: nausea Objective: Vital signs in last 24 hours: Temp:  [97.6 F (36.4 C)-98.2 F (36.8 C)] 98.2 F (36.8 C) (05/15 0737) Pulse Rate:  [48-94] 94 (05/15 0600) Cardiac Rhythm: Normal sinus rhythm;Heart block (05/15 0839) Resp:  [8-25] 25 (05/15 0800) BP: (113-154)/(58-130) 114/61 (05/15 0600) SpO2:  [89 %-100 %] 100 % (05/15 0600) Weight:  [137.1 kg] 137.1 kg (05/15 0600)  Hemodynamic parameters for last 24 hours:    Intake/Output from previous day: 05/14 0701 - 05/15 0700 In: 2057.9 [P.O.:1680; I.V.:377.9] Out: 965 [Urine:965] Intake/Output this shift: Total I/O In: 39.4 [I.V.:39.4] Out: -   General appearance: alert and cooperative Neurologic: intact Heart: regular rate and rhythm, S1, S2 normal, no murmur, click, rub or gallop Lungs: clear to auscultation bilaterally Abdomen: soft, non-tender; bowel sounds normal; no masses,  no organomegaly Extremities: edema 2+ Wound: c/d/i  Lab Results: Recent Labs    01/01/21 0353 01/02/21 0433  WBC 20.2* 17.8*  HGB 8.2* 8.2*  HCT 26.0* 25.6*  PLT 144* 178   BMET:  Recent Labs    01/01/21 0353 01/02/21 0433  NA 130* 129*  K 4.6 4.2  CL 96* 93*  CO2 22 24  GLUCOSE 196* 168*  BUN 45* 56*  CREATININE 4.11* 4.36*  CALCIUM 9.1 9.4    PT/INR: No results for input(s): LABPROT, INR in the last 72 hours. ABG    Component Value Date/Time   PHART 7.368 12/30/2020 0050   HCO3 23.0 12/30/2020 0050   TCO2 24 12/30/2020 0050   ACIDBASEDEF 2.0 12/30/2020 0050   O2SAT 61.3 01/02/2021 0433   CBG (last 3)  Recent Labs    01/01/21 1617 01/01/21 2203 01/02/21 0620  GLUCAP 127* 190* 157*    Assessment/Plan: S/P Procedure(s) (LRB): MITRAL VALVE REPAIR USING CARBOMEDICS ANNULOFLEX  RING SIZE 30 (N/A) 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 RING SIZE 34 (N/A) MAZE (N/A) TRANSESOPHAGEAL ECHOCARDIOGRAM (TEE) (N/A) Mobilize Diuresis appreciate nephro/AHF recs   LOS: 12 days    Wonda Olds 01/02/2021

## 2021-01-03 ENCOUNTER — Inpatient Hospital Stay (HOSPITAL_COMMUNITY): Payer: Medicare Other

## 2021-01-03 DIAGNOSIS — I5033 Acute on chronic diastolic (congestive) heart failure: Secondary | ICD-10-CM

## 2021-01-03 DIAGNOSIS — Z9889 Other specified postprocedural states: Secondary | ICD-10-CM | POA: Diagnosis not present

## 2021-01-03 LAB — BASIC METABOLIC PANEL
Anion gap: 11 (ref 5–15)
BUN: 65 mg/dL — ABNORMAL HIGH (ref 8–23)
CO2: 25 mmol/L (ref 22–32)
Calcium: 9.1 mg/dL (ref 8.9–10.3)
Chloride: 93 mmol/L — ABNORMAL LOW (ref 98–111)
Creatinine, Ser: 4.28 mg/dL — ABNORMAL HIGH (ref 0.44–1.00)
GFR, Estimated: 11 mL/min — ABNORMAL LOW (ref >60)
Glucose, Bld: 150 mg/dL — ABNORMAL HIGH (ref 70–99)
Potassium: 3.6 mmol/L (ref 3.5–5.1)
Sodium: 129 mmol/L — ABNORMAL LOW (ref 135–145)

## 2021-01-03 LAB — CBC
HCT: 24.4 % — ABNORMAL LOW (ref 36.0–46.0)
Hemoglobin: 7.8 g/dL — ABNORMAL LOW (ref 12.0–15.0)
MCH: 28.5 pg (ref 26.0–34.0)
MCHC: 32 g/dL (ref 30.0–36.0)
MCV: 89.1 fL (ref 80.0–100.0)
Platelets: 196 10*3/uL (ref 150–400)
RBC: 2.74 MIL/uL — ABNORMAL LOW (ref 3.87–5.11)
RDW: 14.5 % (ref 11.5–15.5)
WBC: 12.7 10*3/uL — ABNORMAL HIGH (ref 4.0–10.5)
nRBC: 0 % (ref 0.0–0.2)

## 2021-01-03 LAB — GLUCOSE, CAPILLARY
Glucose-Capillary: 152 mg/dL — ABNORMAL HIGH (ref 70–99)
Glucose-Capillary: 127 mg/dL — ABNORMAL HIGH (ref 70–99)
Glucose-Capillary: 164 mg/dL — ABNORMAL HIGH (ref 70–99)
Glucose-Capillary: 93 mg/dL (ref 70–99)

## 2021-01-03 LAB — COOXEMETRY PANEL
Carboxyhemoglobin: 1.4 % (ref 0.5–1.5)
Methemoglobin: 0.7 % (ref 0.0–1.5)
O2 Saturation: 67.7 %
Total hemoglobin: 9.5 g/dL — ABNORMAL LOW (ref 12.0–16.0)

## 2021-01-03 MED ORDER — POTASSIUM CHLORIDE CRYS ER 20 MEQ PO TBCR
20.0000 meq | EXTENDED_RELEASE_TABLET | Freq: Once | ORAL | Status: AC
  Administered 2021-01-03: 20 meq via ORAL
  Filled 2021-01-03: qty 1

## 2021-01-03 MED ORDER — METOLAZONE 5 MG PO TABS
5.0000 mg | ORAL_TABLET | Freq: Once | ORAL | Status: AC
  Administered 2021-01-03: 5 mg via ORAL
  Filled 2021-01-03: qty 1

## 2021-01-03 MED ORDER — POTASSIUM CHLORIDE 10 MEQ/50ML IV SOLN
10.0000 meq | INTRAVENOUS | Status: AC
  Administered 2021-01-03 (×3): 10 meq via INTRAVENOUS
  Filled 2021-01-03 (×3): qty 50

## 2021-01-03 MED ORDER — LACTULOSE 10 GM/15ML PO SOLN
20.0000 g | Freq: Every day | ORAL | Status: DC
  Administered 2021-01-03 – 2021-01-12 (×5): 20 g via ORAL
  Filled 2021-01-03 (×6): qty 30

## 2021-01-03 NOTE — Progress Notes (Signed)
Heart Failure Nurse Navigator Progress Note  AHF rounding team consulted over weekend.   Navigation team available for resources/education.  Pricilla Holm, RN, BSN Heart Failure Nurse Navigator [phone]

## 2021-01-03 NOTE — Progress Notes (Signed)
PT Cancellation Note  Patient Details Name: [name] MRN: [id number] DOB: Aug 25, 1955   Cancelled Treatment:    Reason Eval/Treat Not Completed: Patient declined, no reason specified (pt now in bed and reports she just walked and got to bed 20 min ago)   Sandy Salaam Phuong Moffatt 01/03/2021, 11:01 AM  Bayard Males, [place] Pager: [phone] Office: [phone]

## 2021-01-03 NOTE — Plan of Care (Signed)
  Problem: Education: Goal: Ability to demonstrate management of disease process will improve Outcome: Progressing Goal: Ability to verbalize understanding of medication therapies will improve Outcome: Progressing Goal: Individualized Educational Video(s) Outcome: Progressing   Problem: Activity: Goal: Capacity to carry out activities will improve Outcome: Progressing   Problem: Cardiac: Goal: Ability to achieve and maintain adequate cardiopulmonary perfusion will improve Outcome: Progressing   Problem: Education: Goal: Knowledge of General Education information will improve Description: Including pain rating scale, medication(s)/side effects and non-pharmacologic comfort measures Outcome: Progressing   Problem: Health Behavior/Discharge Planning: Goal: Ability to manage health-related needs will improve Outcome: Progressing   Problem: Clinical Measurements: Goal: Ability to maintain clinical measurements within normal limits will improve Outcome: Progressing Goal: Will remain free from infection Outcome: Progressing Goal: Diagnostic test results will improve Outcome: Progressing Goal: Respiratory complications will improve Outcome: Progressing Goal: Cardiovascular complication will be avoided Outcome: Progressing   Problem: Activity: Goal: Risk for activity intolerance will decrease Outcome: Progressing   Problem: Nutrition: Goal: Adequate nutrition will be maintained Outcome: Progressing   Problem: Coping: Goal: Level of anxiety will decrease Outcome: Progressing   Problem: Elimination: Goal: Will not experience complications related to bowel motility Outcome: Progressing Goal: Will not experience complications related to urinary retention Outcome: Progressing   Problem: Pain Managment: Goal: General experience of comfort will improve Outcome: Progressing   Problem: Safety: Goal: Ability to remain free from injury will improve Outcome: Progressing    Problem: Skin Integrity: Goal: Risk for impaired skin integrity will decrease Outcome: Progressing   Problem: Education: Goal: Will demonstrate proper wound care and an understanding of methods to prevent future damage Outcome: Progressing Goal: Knowledge of disease or condition will improve Outcome: Progressing Goal: Knowledge of the prescribed therapeutic regimen will improve Outcome: Progressing Goal: Individualized Educational Video(s) Outcome: Progressing   Problem: Activity: Goal: Risk for activity intolerance will decrease Outcome: Progressing   Problem: Cardiac: Goal: Will achieve and/or maintain hemodynamic stability Outcome: Progressing   Problem: Clinical Measurements: Goal: Postoperative complications will be avoided or minimized Outcome: Progressing   Problem: Respiratory: Goal: Respiratory status will improve Outcome: Progressing   Problem: Skin Integrity: Goal: Wound healing without signs and symptoms of infection Outcome: Progressing Goal: Risk for impaired skin integrity will decrease Outcome: Progressing   Problem: Urinary Elimination: Goal: Ability to achieve and maintain adequate renal perfusion and functioning will improve Outcome: Progressing

## 2021-01-03 NOTE — Progress Notes (Signed)
PT Cancellation Note  Patient Details Name: Namine Beahm MRN: [id number] DOB: 1956/07/28   Cancelled Treatment:    Reason Eval/Treat Not Completed: Patient declined, no reason specified (pt reports fatigue, nausea and pain and declines mobility at this time despite education)   Lamarr Lulas 01/03/2021, 7:56 AM  [place] Pager: [phone] Office: [phone]

## 2021-01-03 NOTE — Progress Notes (Signed)
Orthopedic Tech Progress Note Patient Details:  [name] 11-21-1955 [id number]  Ortho Devices Type of Ortho Device: Louretta Parma boot Ortho Device/Splint Location: BLE Ortho Device/Splint Interventions: Ordered,Application,Adjustment   Post Interventions Patient Tolerated: Well Instructions Provided: [facility] [place] 01/03/2021, 9:43 AM

## 2021-01-03 NOTE — Evaluation (Signed)
Occupational Therapy  Re-Evaluation Patient Details Name: [name] MRN: [id number] DOB: 1956-05-17 Today's Date: 01/03/2021    History of Present Illness Patient is a 65 y/o female admitted on 12/19/20 with SOB, chest pain, N/V and emesis. Admitted with CHF exacerbation. s/p MVR,TVR and Maze 12/29/20. PMHx: HTN. DM2, A-fib, CHF, OSA, DVT, COPD.   Clinical Impression   Pt was independent in self care and assisted for some IADL. She ambulated short distances, sometimes furniture walking. Pt presents with generalized weakness, impaired standing balance with intermittent knee buckling, and decreased activity tolerance. She requires set up to max assist for ADL and verbal cues for sternal precautions. Pt will have her daughters assist her when she returns home. Recommend HHOT. Will follow acutely.     Follow Up Recommendations  Home health OT;Supervision/Assistance - 24 hour    Equipment Recommendations  Tub/shower bench;Other (comment) (AE)    Recommendations for Other Services       Precautions / Restrictions Precautions Precautions: Fall Precaution Comments: sternal precautions, knees buckle, watch HR Restrictions Weight Bearing Restrictions: Yes (sternal precautions)      Mobility Bed Mobility Overal bed mobility: Needs Assistance Bed Mobility: Rolling;Sit to Sidelying Rolling: Min guard       Sit to sidelying: +2 for physical assistance;Mod assist General bed mobility comments: assist to guide UB and for LEs into bed    Transfers Overall transfer level: Needs assistance Equipment used:  [place] walker) Transfers: Sit to/from Stand Sit to Stand: Min assist         General transfer comment: cues for technique, does better with hands on knees vs holding heart pillow, steadying assist upon initially standing    Balance Overall balance assessment: History of Falls;Needs assistance Sitting-balance support: Feet supported;No upper extremity supported Sitting  balance-Leahy Scale: Good       Standing balance-Leahy Scale: Poor Standing balance comment: reliant in walker and min assist                           ADL either performed or assessed with clinical judgement   ADL Overall ADL's : Needs assistance/impaired Eating/Feeding: Independent;Sitting   Grooming: Set up;Sitting   Upper Body Bathing: Moderate assistance;Sitting   Lower Body Bathing: Maximal assistance;Sit to/from stand   Upper Body Dressing : Minimal assistance;Sitting   Lower Body Dressing: Maximal assistance;Sit to/from stand   Toilet Transfer: Minimal assistance;Ambulation;BSC   Toileting- Clothing Manipulation and Hygiene: Total assistance;Sit to/from stand       Functional mobility during ADLs: Minimal assistance;+2 for safety/equipment (eva walker) General ADL Comments: Pt ambulated with eva walker, intermittent knee buckling.     Vision Baseline Vision/History: Wears glasses Patient Visual Report: No change from baseline       Perception     Praxis      Pertinent Vitals/Pain Pain Assessment: Faces Faces Pain Scale: Hurts a little bit Pain Location: incision Pain Descriptors / Indicators: Discomfort Pain Intervention(s): Premedicated before session;Repositioned     Hand Dominance Right   Extremity/Trunk Assessment Upper Extremity Assessment Upper Extremity Assessment: Overall WFL for tasks assessed   Lower Extremity Assessment Lower Extremity Assessment: Defer to PT evaluation   Cervical / Trunk Assessment Cervical / Trunk Assessment: Other exceptions Cervical / Trunk Exceptions: obese   Communication Communication Communication: No difficulties   Cognition Arousal/Alertness: Awake/alert Behavior During Therapy: WFL for tasks assessed/performed Overall Cognitive Status: Within Functional Limits for tasks assessed  General Comments       Exercises     Shoulder  Instructions      Home Living Family/patient expects to be discharged to:: Private residence Living Arrangements: Children Available Help at Discharge: Family;Available 24 hours/day Type of Home: Apartment Home Access: Stairs to enter Entrance Stairs-Number of Steps: 1 + 1+ down then a landing then 2 steps up Entrance Stairs-Rails: Left Home Layout: One level     Bathroom Shower/Tub: Teacher, early years/pre: Handicapped height     Home Equipment: Cane - single point;Walker - 4 wheels;Shower seat;Wheelchair - manual   Additional Comments: wears 02 as needed.      Prior Functioning/Environment Level of Independence: Independent with assistive device(s)        Comments: Independent for household mobility sometimes furniture walking, uses [facility] for community ambulation. Walks short distances only due to SOB. Does not drive. Does some IADLs, does own ADLs. Hx of falls, wears 02 as needed.        OT Problem List: Impaired balance (sitting and/or standing);Decreased activity tolerance;Decreased knowledge of use of DME or AE;Obesity;Pain;Decreased strength      OT Treatment/Interventions: Self-care/ADL training;Patient/family education;Therapeutic activities;Energy conservation;DME and/or AE instruction    OT Goals(Current goals can be found in the care plan section) Acute Rehab OT Goals Patient Stated Goal: return to PLOF OT Goal Formulation: With patient Time For Goal Achievement: 01/17/21 Potential to Achieve Goals: Good  OT Frequency: Min 2X/week   Barriers to D/C:            Co-evaluation              AM-PAC OT "6 Clicks" Daily Activity     Outcome Measure Help from another person eating meals?: None Help from another person taking care of personal grooming?: A Little Help from another person toileting, which includes using toliet, bedpan, or urinal?: Total Help from another person bathing (including washing, rinsing, drying)?: A Lot Help from  another person to put on and taking off regular upper body clothing?: A Little Help from another person to put on and taking off regular lower body clothing?: A Lot 6 Click Score: 15   End of Session Equipment Utilized During Treatment: Other (comment) [place] Pier) Nurse Communication: Mobility status  Activity Tolerance: Patient tolerated treatment well Patient left: in bed;with call bell/phone within reach;with nursing/sitter in room  OT Visit Diagnosis: Unsteadiness on feet (R26.81);History of falling (Z91.81);Pain                Time: 9147-8295 OT Time Calculation (min): 32 min Charges:  OT General Charges $OT Visit: 1 Visit OT Evaluation $OT Re-eval: 1 Re-eval OT Treatments $Self Care/Home Management : 8-22 mins  Nestor Lewandowsky, OTR/L Acute Rehabilitation Services Pager: [phone] Office: [phone]  Malka So 01/03/2021, 10:54 AM

## 2021-01-03 NOTE — Progress Notes (Signed)
5 Days Post-Op Procedure(s) (LRB): MITRAL VALVE REPAIR USING CARBOMEDICS ANNULOFLEX RING SIZE 30 (N/A) 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 RING SIZE 34 (N/A) MAZE (N/A) TRANSESOPHAGEAL ECHOCARDIOGRAM (TEE) (N/A) Subjective: C/o incisional pain, still with some nausea. No BM  Objective: Vital signs in last 24 hours: Temp:  [97.6 F (36.4 C)-97.9 F (36.6 C)] 97.7 F (36.5 C) (05/15 2000) Pulse Rate:  [85-97] 90 (05/16 0700) Cardiac Rhythm: Normal sinus rhythm;Heart block (05/15 2000) Resp:  [9-25] 21 (05/16 0700) BP: (76-146)/(60-133) 114/69 (05/16 0611) SpO2:  [89 %-100 %] 96 % (05/16 0700) Weight:  [135.7 kg] 135.7 kg (05/16 0500)  Hemodynamic parameters for last 24 hours:    Intake/Output from previous day: 05/15 0701 - 05/16 0700 In: 1098.8 [P.O.:460; I.V.:595.6; IV Piggyback:43.2] Out: 2800 [Urine:2800] Intake/Output this shift: No intake/output data recorded.  General appearance: alert, cooperative and no distress Neurologic: intact Heart: regular rate and rhythm Lungs: diminished breath sounds bibasilar Abdomen: normal findings: soft, non-tender  Lab Results: Recent Labs    01/02/21 0433 01/03/21 0419  WBC 17.8* 12.7*  HGB 8.2* 7.8*  HCT 25.6* 24.4*  PLT 178 196   BMET:  Recent Labs    01/02/21 0433 01/03/21 0419  NA 129* 129*  K 4.2 3.6  CL 93* 93*  CO2 24 25  GLUCOSE 168* 150*  BUN 56* 65*  CREATININE 4.36* 4.28*  CALCIUM 9.4 9.1    PT/INR: No results for input(s): LABPROT, INR in the last 72 hours. ABG    Component Value Date/Time   PHART 7.368 12/30/2020 0050   HCO3 23.0 12/30/2020 0050   TCO2 24 12/30/2020 0050   ACIDBASEDEF 2.0 12/30/2020 0050   O2SAT 67.7 01/03/2021 0419   CBG (last 3)  Recent Labs    01/02/21 1554 01/02/21 2139 01/03/21 0630  GLUCAP 155* 170* 152*    Assessment/Plan: S/P Procedure(s) (LRB): MITRAL VALVE REPAIR USING CARBOMEDICS ANNULOFLEX RING SIZE 30 (N/A) TRICUSPID VALVE REPAIR WITH  EDWARDS MC3 TRICUSPID RING SIZE 34 (N/A) MAZE (N/A) TRANSESOPHAGEAL ECHOCARDIOGRAM (TEE) (N/A) -Slow progress CV- in SR. Co-ox 68 on milrinone 0.25  DC pacing wires RESP- continue IS RENAL- creatinine stable at 4.28, diuresing with Lasix drip  C/w resolving AKI in setting of chronic kidney disease  Supplement K GI- no BM- will give lactulose today ENDO - CBG controlled with current regimen Anemia secondary to ABL- follow SCD + enoxaparin for DVT prophylaxis Continue to mobilize   LOS: 13 days    [name] 01/03/2021

## 2021-01-03 NOTE — Progress Notes (Signed)
TCTS BRIEF SICU PROGRESS NOTE  5 Days Post-Op  S/P Procedure(s) (LRB): MITRAL VALVE REPAIR USING CARBOMEDICS ANNULOFLEX RING SIZE 30 (N/A) 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 RING SIZE 34 (N/A) MAZE (N/A) TRANSESOPHAGEAL ECHOCARDIOGRAM (TEE) (N/A)   Stable day  Plan: Continue current plan  Rexene Alberts, MD 01/03/2021 7:01 PM

## 2021-01-03 NOTE — Progress Notes (Signed)
Nephrology Follow-Up Consult note   Assessment/Recommendations: [name] is a/an 65 y.o. female with a past medical history significant for OSA, HTN, DVT, DM2, COPD, admitted for CHF exacerbation with mitral and tricuspid valvular disease status post repair.       Nonoliguric AKI/cardiorenal syndrome/ATN: Likely secondary to ATN associated with surgery and contribution of cardiorenal syndrome with volume overload.  Creatinine fairly stable today with improved urine output. -Continue aggressive diuretics with Lasix infusion 30mg /hr and metolazone 5mg  daily -Monitor weight, uop, and cvp -No urgent need for dialysis, continue medical mgmt -HF team and surgery following; appreciate help -Continue to monitor daily Cr, Dose meds for GFR -Monitor Daily I/Os, Daily weight  -Maintain MAP>65 for optimal renal perfusion.  -Avoid nephrotoxic medications including NSAIDs and Vanc/Zosyn combo  CHF exacerbation c/b valvular disease s/p repair: on 5/12. On milrinone. Appreciate HF and CT surg help Atrial fibrillation: s/p Maze. On amiodarone Morbid obesity: long term issue DM2: mgmt per primary Hyponatremia: Sodium 129.  Likely related to volume excess.  Monitor daily Anemia: Hemoglobin 7.8.  Multifactorial.  Continue to monitor.  No ESA   Recommendations conveyed to primary service.    [facility] 01/03/2021 10:20 AM  ___________________________________________________________  CC: AKI on CKD  Interval History/Subjective: Improved urine output with 2.6 L made.  Slight improvement in weight.  CVP remains elevated.  Patient has some chest discomfort and feels nauseated.  Urinating well.   Medications:  Current Facility-Administered Medications  Medication Dose Route Frequency Provider Last Rate Last Admin  . 0.45 % sodium chloride infusion   Intravenous Continuous PRN Elgie Collard, PA-C   Stopped at 12/30/20 1019  . 0.9 %  sodium chloride  infusion  250 mL Intravenous Continuous Conte, Tessa N, PA-C      . 0.9 %  sodium chloride infusion   Intravenous Continuous Elgie Collard, PA-C 20 mL/hr at 12/29/20 1533 New Bag/Given (Non-Interop) at 12/29/20 1533  . acetaminophen (TYLENOL) tablet 1,000 mg  1,000 mg Oral Q6H Conte, Tessa N, PA-C   1,000 mg at 01/03/21 0547   Or  . acetaminophen (TYLENOL) 160 MG/5ML solution 1,000 mg  1,000 mg Per Tube Q6H Conte, Tessa N, PA-C      . amiodarone (PACERONE) tablet 200 mg  200 mg Oral Daily Wilson Singer I, RPH   200 mg at 01/02/21 5809  . aspirin EC tablet 81 mg  81 mg Oral Daily [name], MD   81 mg at 01/02/21 9833  . bisacodyl (DULCOLAX) EC tablet 10 mg  10 mg Oral Daily Nicholes Rough N, PA-C   10 mg at 01/01/21 8250   Or  . bisacodyl (DULCOLAX) suppository 10 mg  10 mg Rectal Daily Harriet Pho, Tessa N, PA-C      . Chlorhexidine Gluconate Cloth 2 % PADS 6 each  6 each Topical Daily [name], MD   6 each at 01/02/21 1058  . dextrose 50 % solution 0-50 mL  0-50 mL Intravenous PRN Harriet Pho, Tessa N, PA-C      . docusate sodium (COLACE) capsule 200 mg  200 mg Oral Daily Wilson Singer I, RPH   200 mg at 01/01/21 0948  . DULoxetine (CYMBALTA) DR capsule 60 mg  60 mg Oral Daily Nicholes Rough N, PA-C   60 mg at 01/02/21 1020  . enoxaparin (LOVENOX) injection 30 mg  30 mg Subcutaneous QHS Lyndee Leo, RPH   30 mg at 01/02/21 2143  . furosemide (LASIX) 200  mg in dextrose 5 % 100 mL (2 mg/mL) infusion  30 mg/hr Intravenous Continuous Bensimhon, Shaune Pascal, MD 15 mL/hr at 01/03/21 1000 30 mg/hr at 01/03/21 1000  . insulin aspart (novoLOG) injection 0-20 Units  0-20 Units Subcutaneous TID WC [name], MD   4 Units at 01/03/21 0729  . insulin aspart (novoLOG) injection 0-5 Units  0-5 Units Subcutaneous QHS [name], MD      . insulin aspart (novoLOG) injection 3 Units  3 Units Subcutaneous TID WC [name], MD   3 Units at 01/02/21 1633  .  insulin detemir (LEVEMIR) injection 40 Units  40 Units Subcutaneous BID [name], MD   40 Units at 01/02/21 2143  . lactated ringers infusion   Intravenous Continuous Conte, Tessa N, PA-C      . lactated ringers infusion   Intravenous Continuous Elgie Collard, [place]   Stopped at 12/31/20 1720  . lactulose (CHRONULAC) 10 GM/15ML solution 20 g  20 g Oral Daily [name], MD      . MEDLINE mouth rinse  15 mL Mouth Rinse BID [name], MD   15 mL at 01/02/21 2223  . metolazone (ZAROXOLYN) tablet 5 mg  5 mg Oral Once Lyda Jester M, PA-C      . metoprolol tartrate (LOPRESSOR) injection 2.5-5 mg  2.5-5 mg Intravenous Q2H PRN Conte, Tessa N, PA-C      . milrinone (PRIMACOR) 20 MG/100 ML (0.2 mg/mL) infusion  0.25 mcg/kg/min Intravenous Continuous Bensimhon, Shaune Pascal, MD 9.74 mL/hr at 01/03/21 1000 0.25 mcg/kg/min at 01/03/21 1000  . morphine 2 MG/ML injection 1-4 mg  1-4 mg Intravenous Q1H PRN Nicholes Rough N, PA-C   2 mg at 12/30/20 1939  . ondansetron (ZOFRAN) injection 4 mg  4 mg Intravenous Q6H PRN Elgie Collard, PA-C   4 mg at 01/03/21 0545  . oxyCODONE (Oxy IR/ROXICODONE) immediate release tablet 5-10 mg  5-10 mg Oral Q3H PRN Wilson Singer I, RPH   10 mg at 01/02/21 1325  . pantoprazole (PROTONIX) EC tablet 40 mg  40 mg Oral Daily [name], MD   40 mg at 01/02/21 1021  . potassium chloride SA (KLOR-CON) CR tablet 20 mEq  20 mEq Oral Once Lyda Jester M, PA-C      . promethazine (PHENERGAN) 6.25 mg in sodium chloride 0.9 % 50 mL IVPB  6.25 mg Intravenous Q6H PRN Wonda Olds, MD 200 mL/hr at 01/03/21 0829 6.25 mg at 01/03/21 0829  . rosuvastatin (CRESTOR) tablet 10 mg  10 mg Oral Daily Wilson Singer I, RPH   10 mg at 01/02/21 0951  . sodium chloride flush (NS) 0.9 % injection 10-40 mL  10-40 mL Intracatheter PRN [name], MD      . sodium chloride flush (NS) 0.9 % injection 3 mL  3 mL Intravenous PRN Conte, Tessa  N, PA-C      . traMADol Veatrice Bourbon) tablet 50-100 mg  50-100 mg Oral Q4H PRN Wilson Singer I, RPH   100 mg at 01/03/21 0809      Review of Systems: 10 systems reviewed and negative except per interval history/subjective  Physical Exam: Vitals:   01/03/21 0900 01/03/21 1000  BP:    Pulse: 90 98  Resp: 16 (!) 21  Temp:    SpO2: 95% 95%   Total I/O In: 204.6 [I.V.:73.1; IV Piggyback:131.6] Out: 470 [Urine:470]  Intake/Output Summary (Last 24 hours) at 01/03/2021 1020 Last data  filed at 01/03/2021 1000 Gross per 24 hour  Intake 1244.32 ml  Output 3270 ml  Net -2025.68 ml   Constitutional: Tired appearing, sitting in chair, no distress ENMT: ears and nose without scars or lesions, MMM CV: normal rate Respiratory: Bilateral chest rise with no increased work of breathing Gastrointestinal: soft, non-tender, no palpable masses or hernias Skin: no visible lesions or rashes Psych: alert, judgement/insight appropriate, appropriate mood and affect   Test Results I personally reviewed new and old clinical labs and radiology tests Lab Results  Component Value Date   NA 129 (L) 01/03/2021   K 3.6 01/03/2021   CL 93 (L) 01/03/2021   CO2 25 01/03/2021   BUN 65 (H) 01/03/2021   CREATININE 4.28 (H) 01/03/2021   CALCIUM 9.1 01/03/2021   ALBUMIN 3.5 01/02/2021   PHOS 3.2 03/08/2020

## 2021-01-03 NOTE — Progress Notes (Signed)
Epicardial pacing wires removed at 1200 per order. Wires intact upon removal. Patient maintained on bedrest for one hour post removal and VS obtained Q15 x 4 per protocol. Patient educated and tolerated procedure well. Patient with no complaints. Will continue to monitor.

## 2021-01-03 NOTE — Progress Notes (Addendum)
Advanced Heart Failure Rounding Note   Subjective:    Remains on milrinone 0.25. Co-ox 68%.   Lasix gtt increased to 30/hr yesterday + 5 of metolazone . 2.8L in UOP. Wt down 3 lb, still 14 lb above pre-op wt. CVP 20   Scr seems to be leveling off 4.1 -> 4.36->4.28.   Sitting up in chair. No resting dyspnea.   Echo LVEF 55% RV moderately HK. Moderate TR. No significant MR. No effusion .   Objective:   Weight Range:  Vital Signs:   Temp:  [97.6 F (36.4 C)-98 F (36.7 C)] 98 F (36.7 C) (05/16 0742) Pulse Rate:  [85-97] 89 (05/16 0800) Resp:  [9-21] 16 (05/16 0800) BP: (76-146)/(60-133) 116/62 (05/16 0800) SpO2:  [89 %-100 %] 94 % (05/16 0800) Weight:  [135.7 kg] 135.7 kg (05/16 0500) Last BM Date: 12/28/20  Weight change: Filed Weights   01/01/21 0500 01/02/21 0600 01/03/21 0500  Weight: (!) 137.1 kg (!) 137.1 kg 135.7 kg    Intake/Output:   Intake/Output Summary (Last 24 hours) at 01/03/2021 0837 Last data filed at 01/03/2021 0815 Gross per 24 hour  Intake 1059.43 ml  Output 2985 ml  Net -1925.57 ml     PHYSICAL EXAM: CVP >20  General:  Well appearing, obese, sitting up in chair. No respiratory difficulty HEENT: normal Neck: supple. JVD elevated to ear. Carotids 2+ bilat; no bruits. No lymphadenopathy or thyromegaly appreciated. Cor: PMI nondisplaced. Regular rate & rhythm. 2/6 SEM LUSB, sternotomy site ok Lungs: clear Abdomen: obese, soft, nontender, nondistended. No hepatosplenomegaly. No bruits or masses. Good bowel sounds. Extremities: obese LEs no cyanosis, clubbing, rash, 1+ edema Neuro: alert & oriented x 3, cranial nerves grossly intact. moves all 4 extremities w/o difficulty. Affect pleasant.   Telemetry: Sinus 90s Personally reviewed  Labs: Basic Metabolic Panel: Recent Labs  Lab 12/29/20 2117 12/29/20 2318 12/30/20 0414 12/30/20 1641 12/31/20 0306 01/01/21 0353 01/02/21 0433 01/03/21 0419  NA 138   < > 136 133* 134* 130* 129*  129*  K 4.8   < > 4.6 4.3 4.3 4.6 4.2 3.6  CL 107  --  102 100 99 96* 93* 93*  CO2 24  --  24 25 25 22 24 25   GLUCOSE 157*  --  161* 178* 127* 196* 168* 150*  BUN 30*  --  30* 32* 35* 45* 56* 65*  CREATININE 1.94*  --  2.00* 2.55* 3.03* 4.11* 4.36* 4.28*  CALCIUM 8.7*  --  8.9 9.1 9.4 9.1 9.4 9.1  MG 3.6*  --  3.1* 3.1*  --   --   --   --    < > = values in this interval not displayed.    Liver Function Tests: Recent Labs  Lab 01/02/21 0433  AST 30  ALT <5  ALKPHOS 93  BILITOT 0.9  PROT 7.5  ALBUMIN 3.5   No results for input(s): LIPASE, AMYLASE in the last 168 hours. No results for input(s): AMMONIA in the last 168 hours.  CBC: Recent Labs  Lab 12/30/20 1641 12/31/20 0306 01/01/21 0353 01/02/21 0433 01/03/21 0419  WBC 21.6* 22.6* 20.2* 17.8* 12.7*  HGB 9.5* 9.3* 8.2* 8.2* 7.8*  HCT 30.9* 29.4* 26.0* 25.6* 24.4*  MCV 92.0 91.0 91.5 89.8 89.1  PLT 126* 120* 144* 178 196    Cardiac Enzymes: No results for input(s): CKTOTAL, CKMB, CKMBINDEX, TROPONINI in the last 168 hours.  BNP: BNP (last 3 results) Recent Labs    03/02/20 1527  11/22/20 1527 12/19/20 1016  BNP 43.7 132.8* 227.9*    ProBNP (last 3 results) No results for input(s): PROBNP in the last 8760 hours.    Other results:  Imaging: US RENAL  Result Date: 01/02/2021 CLINICAL DATA:  Acute renal failure EXAM: RENAL / URINARY TRACT ULTRASOUND COMPLETE COMPARISON:  CT abdomen pelvis 03/04/2020 FINDINGS: Right Kidney: Renal measurements: 10.1 x 5.2 x 5.6 cm = volume: 155 mL. Somewhat limited visualization due to shadowing bowel gas. Echogenicity within normal limits. No mass or hydronephrosis visualized. Left Kidney: Renal measurements: 11.6 x 5.8 x 4.4 cm = volume: 156 mL. Echogenicity within normal limits. No mass or hydronephrosis visualized. Bladder: Appears normal for degree of bladder distention. Foley catheter in place. Other: None. IMPRESSION: No definite acute finding in the bilateral kidneys.  Visualization is somewhat limited by shadowing bowel gas. Electronically Signed   By: Audie Pinto M.D.   On: 01/02/2021 14:29   DG Chest Port 1 View  Result Date: 01/03/2021 CLINICAL DATA:  Chest pain this morning, history of open heart surgery EXAM: PORTABLE CHEST 1 VIEW COMPARISON:  01/02/2021 radiograph, chest CT 01/15/2020 FINDINGS: Right upper extremity approach catheter tip overlies the distal superior vena cava. Unchanged, markedly enlarged cardiac silhouette. Intact sternotomy wires with unchanged left atrial appendage occlusion device, valve annuloplasty, and epicardial pacing wires. Mild diffuse interstitial opacities. There is no pleural effusion or visible pneumothorax. Bones are unchanged. IMPRESSION: Cardiomegaly with mild pulmonary edema. Electronically Signed   By: Maurine Simmering   On: 01/03/2021 08:34   DG Chest Port 1 View  Result Date: 01/02/2021 CLINICAL DATA:  History of open heart surgery. History of asthma, CHF. EXAM: PORTABLE CHEST 1 VIEW COMPARISON:  01/01/2021 FINDINGS: RIGHT-sided PICC line tip overlies the superior vena cava. Median sternotomy and valve annuloplasty. Stable cardiomegaly. There is better lung expansion compared to prior study. Minimal residual subsegmental atelectasis at the LEFT lung base. IMPRESSION: Improved aeration.  LEFT LOWER lobe atelectasis. Electronically Signed   By: Nolon Nations M.D.   On: 01/02/2021 08:45   ECHOCARDIOGRAM LIMITED  Result Date: 01/02/2021    ECHOCARDIOGRAM LIMITED REPORT   Patient Name:   [name] Date of Exam: 01/02/2021 Medical Rec #:  [id number]            Height:       67.0 in Accession #:    [id number]           Weight:       302.2 lb Date of Birth:  [birth year]            BSA:          2.410 m Patient Age:    65 years             BP:           114/61 mmHg Patient Gender: F                    HR:           77 bpm. Exam Location:  Inpatient Procedure: Limited Echo, Cardiac Doppler and Color Doppler Indications:     Mitral and tricuspid valve repair evaluation  History:        Patient has prior history of Echocardiogram examinations, most                 recent 12/29/2020. CHF, Mitral Valve Disease, Arrythmias:Atrial  Fibrillation; Risk Factors:Diabetes and Sleep Apnea. MITRAL                 VALVE REPAIR USING CARBOMEDICS ANNULOFLEX RING SIZE 30 (N/A)                 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 RING SIZE 34                 (N/A)                 MAZE (N/A).  Sonographer:    Clayton Lefort RDCS (AE) Referring Phys: [id number] BROADUS Z ATKINS IMPRESSIONS  1. No subcostal views; Elevated mean gradient across aortic valve (mean gradient 13 mmHg) but visually valve opens well; S/P MV repair with mean gradient 7 mmHg and trace MR; S/P TV repair with moderate TR.  2. Left ventricular ejection fraction, by estimation, is 60 to 65%. The left ventricle has normal function. The left ventricle has no regional wall motion abnormalities. There is moderate left ventricular hypertrophy. There is the interventricular septum is flattened in systole and diastole, consistent with right ventricular pressure and volume overload.  3. Right ventricular systolic function is normal. The right ventricular size is mildly enlarged.  4. Left atrial size was moderately dilated.  5. Right atrial size was mildly dilated.  6. The mitral valve has been repaired/replaced. Trivial mitral valve regurgitation. No evidence of mitral stenosis.  7. The tricuspid valve is has been repaired/replaced. Tricuspid valve regurgitation is moderate.  8. The aortic valve is tricuspid. Aortic valve regurgitation is not visualized. No aortic stenosis is present. FINDINGS  Left Ventricle: Left ventricular ejection fraction, by estimation, is 60 to 65%. The left ventricle has normal function. The left ventricle has no regional wall motion abnormalities. The left ventricular internal cavity size was normal in size. There is  moderate left ventricular  hypertrophy. The interventricular septum is flattened in systole and diastole, consistent with right ventricular pressure and volume overload. Right Ventricle: The right ventricular size is mildly enlarged. Right ventricular systolic function is normal. Left Atrium: Left atrial size was moderately dilated. Right Atrium: Right atrial size was mildly dilated. Pericardium: There is no evidence of pericardial effusion. Mitral Valve: The mitral valve has been repaired/replaced. Trivial mitral valve regurgitation. There is a prosthetic annuloplasty ring present in the mitral position. No evidence of mitral valve stenosis. MV peak gradient, 21.2 mmHg. The mean mitral valve gradient is 7.0 mmHg. Tricuspid Valve: The tricuspid valve is has been repaired/replaced. Tricuspid valve regurgitation is moderate . No evidence of tricuspid stenosis. Aortic Valve: The aortic valve is tricuspid. Aortic valve regurgitation is not visualized. No aortic stenosis is present. Aortic valve mean gradient measures 13.0 mmHg. Aortic valve peak gradient measures 25.2 mmHg. Aortic valve area, by VTI measures 1.25 cm. Pulmonic Valve: The pulmonic valve was normal in structure. Pulmonic valve regurgitation is not visualized. No evidence of pulmonic stenosis. Aorta: The aortic root is normal in size and structure. Venous: The inferior vena cava was not well visualized. IAS/Shunts: The interatrial septum is aneurysmal. No atrial level shunt detected by color flow Doppler. Additional Comments: No subcostal views; Elevated mean gradient across aortic valve (mean gradient 13 mmHg) but visually valve opens well; S/P MV repair with mean gradient 7 mmHg and trace MR; S/P TV repair with moderate TR. LEFT VENTRICLE PLAX 2D LVIDd:         4.70 cm LVIDs:         3.90 cm  LV PW:         1.50 cm LV IVS:        1.50 cm LVOT diam:     1.80 cm LV SV:         48 LV SV Index:   20 LVOT Area:     2.54 cm  LEFT ATRIUM         Index LA diam:    4.90 cm 2.03 cm/m   AORTIC VALVE AV Area (Vmax):    1.30 cm AV Area (Vmean):   1.19 cm AV Area (VTI):     1.25 cm AV Vmax:           251.00 cm/s AV Vmean:          168.000 cm/s AV VTI:            0.380 m AV Peak Grad:      25.2 mmHg AV Mean Grad:      13.0 mmHg LVOT Vmax:         128.00 cm/s LVOT Vmean:        78.500 cm/s LVOT VTI:          0.187 m LVOT/AV VTI ratio: 0.49  AORTA Ao Root diam: 3.20 cm MITRAL VALVE             TRICUSPID VALVE MV Area VTI:  0.99 cm   TR Peak grad:   56.9 mmHg MV Peak grad: 21.2 mmHg  TR Vmax:        377.00 cm/s MV Mean grad: 7.0 mmHg MV Vmax:      2.30 m/s   SHUNTS MV Vmean:     117.0 cm/s Systemic VTI:  0.19 m                          Systemic Diam: 1.80 cm Kirk Ruths MD Electronically signed by Kirk Ruths MD Signature Date/Time: 01/02/2021/12:42:34 PM    Final      Medications:     Scheduled Medications: . acetaminophen  1,000 mg Oral Q6H   Or  . acetaminophen (TYLENOL) oral liquid 160 mg/5 mL  1,000 mg Per Tube Q6H  . amiodarone  200 mg Oral Daily  . aspirin EC  81 mg Oral Daily  . bisacodyl  10 mg Oral Daily   Or  . bisacodyl  10 mg Rectal Daily  . Chlorhexidine Gluconate Cloth  6 each Topical Daily  . docusate sodium  200 mg Oral Daily  . DULoxetine  60 mg Oral Daily  . enoxaparin (LOVENOX) injection  30 mg Subcutaneous QHS  . insulin aspart  0-20 Units Subcutaneous TID WC  . insulin aspart  0-5 Units Subcutaneous QHS  . insulin aspart  3 Units Subcutaneous TID WC  . insulin detemir  40 Units Subcutaneous BID  . lactulose  20 g Oral Daily  . mouth rinse  15 mL Mouth Rinse BID  . pantoprazole  40 mg Oral Daily  . rosuvastatin  10 mg Oral Daily    Infusions: . sodium chloride Stopped (12/30/20 1019)  . sodium chloride    . sodium chloride 20 mL/hr at 12/29/20 1533  . furosemide (LASIX) 200 mg in dextrose 5% 100 mL (2mg /mL) infusion 30 mg/hr (01/03/21 0700)  . lactated ringers    . lactated ringers Stopped (12/31/20 1720)  . milrinone 0.25 mcg/kg/min  (01/03/21 0700)  . norepinephrine (LEVOPHED) Adult infusion Stopped (12/30/20 0525)  . phenylephrine (NEO-SYNEPHRINE) Adult infusion    .  potassium chloride 10 mEq (01/03/21 0809)  . promethazine (PHENERGAN) injection (IM or IVPB) 6.25 mg (01/03/21 0829)  . vasopressin Stopped (12/30/20 1138)    PRN Medications: sodium chloride, dextrose, metoprolol tartrate, morphine injection, ondansetron (ZOFRAN) IV, oxyCODONE, promethazine (PHENERGAN) injection (IM or IVPB), sodium chloride flush, sodium chloride flush, traMADol   Assessment/Plan:   1. Valvular heart disease s/p MVR/TVR  on 5/12 - Severe TRs/p annuloplasty. Severe MRs/p annuloplasty on 12/30/20 - Echo 01/02/21 LVEF 55% RV moderately HK. Moderate TR. No significant MR. No effusion.  2. Acute on chronic systolic HF with R>>L HF - TEE 4/22 EF 45-50% RV moderately HK  - Echo LVEF 55% RV moderately HK. Moderate TR. No significant MR. No effusion. - co-ox 68% on milrinone 0.25 - remains volume overloaded. Weigh up 14 pounds from baseline - Creatinine elevated but may be plateauing  1.9 -> 4.0 -> 4.36->4.28. No indication for HD/CVVHD currently. Hopefully we can avoid. - CVP 22. Making urine on lasix gtt at 30. Will repeat metolazone 5mg  x 1.  - Wrap legs.   3. AKI on CKD 3b post-op  - baseline SCr 1.6-1.8 - Scr 4.1 -> 4.36->4.28 today. Likely post-op ATN - Plan as above - Keep MAP >= 70  4. Longstanding Persistent Atrial Fibrillation s/p Maze - CHADSVASC 4+  - ASA 324 per TCTS -off digoxin with AKI  - continue low dose amiodarone 200 mg PO Daily - AC per TCTS  5. Morbid Obesity - needs weight loss  6. DM2 - SSI   7. Hyponatremia - Na 129, likely 2/2 hypervolemia - c/w diuresis  - may need tolvaptan    Length of Stay: [place] PA-C  01/03/2021, 8:37 AM  Advanced Heart Failure Team Pager [phone] (M-F; [place])  Please contact [facility] for night-coverage after hours (4p -7a ) and  weekends on [email or web address]   Patient seen and examined with the above-signed Advanced Practice Provider and/or Housestaff. I personally reviewed laboratory data, imaging studies and relevant notes. I independently examined the patient and formulated the important aspects of the plan. I have edited the note to reflect any of my changes or salient points. I have personally discussed the plan with the patient and/or family.  On milrinone 0.25. Co-ox 68% On lasix gtt at 30/hr. Also got metolazone. Good urine output. SCR improved slightly. CVP still high but coming down.  Mild nausea and orthopnea.   General: Sitting up in bed  HEENT: normal Neck: supple. JVP to ear Carotids 2+ bilat; no bruits. No lymphadenopathy or thryomegaly appreciated. Cor: Sternal wound ok . Regular rate & rhythm. 2/6 TR Lungs: decreased at bases Abdomen: obese soft, nontender, nondistended. No hepatosplenomegaly. No bruits or masses. Good bowel sounds. Extremities: no cyanosis, clubbing, rash, 2+  edema Neuro: alert & orientedx3, cranial nerves grossly intact. moves all 4 extremities w/o difficulty. Affect pleasant  Renal function seems to be coming around slowly. Still markedly volume overloaded. Continue milrinone and lasix gtt. Repeat metolazone. Mobilize.   Glori Bickers, MD  9:04 AM

## 2021-01-04 DIAGNOSIS — Z9889 Other specified postprocedural states: Secondary | ICD-10-CM | POA: Diagnosis not present

## 2021-01-04 DIAGNOSIS — I5033 Acute on chronic diastolic (congestive) heart failure: Secondary | ICD-10-CM | POA: Diagnosis not present

## 2021-01-04 LAB — BASIC METABOLIC PANEL
Anion gap: 11 (ref 5–15)
BUN: 68 mg/dL — ABNORMAL HIGH (ref 8–23)
CO2: 30 mmol/L (ref 22–32)
Calcium: 8.9 mg/dL (ref 8.9–10.3)
Chloride: 91 mmol/L — ABNORMAL LOW (ref 98–111)
Creatinine, Ser: 3.09 mg/dL — ABNORMAL HIGH (ref 0.44–1.00)
GFR, Estimated: 16 mL/min — ABNORMAL LOW (ref >60)
Glucose, Bld: 174 mg/dL — ABNORMAL HIGH (ref 70–99)
Potassium: 3.5 mmol/L (ref 3.5–5.1)
Sodium: 132 mmol/L — ABNORMAL LOW (ref 135–145)

## 2021-01-04 LAB — COMPREHENSIVE METABOLIC PANEL
ALT: 5 U/L (ref 0–44)
AST: 21 U/L (ref 15–41)
Albumin: 3 g/dL — ABNORMAL LOW (ref 3.5–5.0)
Alkaline Phosphatase: 104 U/L (ref 38–126)
Anion gap: 10 (ref 5–15)
BUN: 66 mg/dL — ABNORMAL HIGH (ref 8–23)
CO2: 29 mmol/L (ref 22–32)
Calcium: 9.1 mg/dL (ref 8.9–10.3)
Chloride: 92 mmol/L — ABNORMAL LOW (ref 98–111)
Creatinine, Ser: 3.6 mg/dL — ABNORMAL HIGH (ref 0.44–1.00)
GFR, Estimated: 14 mL/min — ABNORMAL LOW (ref >60)
Glucose, Bld: 154 mg/dL — ABNORMAL HIGH (ref 70–99)
Potassium: 3.2 mmol/L — ABNORMAL LOW (ref 3.5–5.1)
Sodium: 131 mmol/L — ABNORMAL LOW (ref 135–145)
Total Bilirubin: 0.8 mg/dL (ref 0.3–1.2)
Total Protein: 7.1 g/dL (ref 6.5–8.1)

## 2021-01-04 LAB — COOXEMETRY PANEL
Carboxyhemoglobin: 1.7 % — ABNORMAL HIGH (ref 0.5–1.5)
Methemoglobin: 1 % (ref 0.0–1.5)
O2 Saturation: 70 %
Total hemoglobin: 7.9 g/dL — ABNORMAL LOW (ref 12.0–16.0)

## 2021-01-04 LAB — CBC
HCT: 23.8 % — ABNORMAL LOW (ref 36.0–46.0)
Hemoglobin: 7.7 g/dL — ABNORMAL LOW (ref 12.0–15.0)
MCH: 28.5 pg (ref 26.0–34.0)
MCHC: 32.4 g/dL (ref 30.0–36.0)
MCV: 88.1 fL (ref 80.0–100.0)
Platelets: 200 10*3/uL (ref 150–400)
RBC: 2.7 MIL/uL — ABNORMAL LOW (ref 3.87–5.11)
RDW: 14.4 % (ref 11.5–15.5)
WBC: 10.1 10*3/uL (ref 4.0–10.5)
nRBC: 0 % (ref 0.0–0.2)

## 2021-01-04 LAB — GLUCOSE, CAPILLARY
Glucose-Capillary: 97 mg/dL (ref 70–99)
Glucose-Capillary: 119 mg/dL — ABNORMAL HIGH (ref 70–99)
Glucose-Capillary: 141 mg/dL — ABNORMAL HIGH (ref 70–99)
Glucose-Capillary: 197 mg/dL — ABNORMAL HIGH (ref 70–99)

## 2021-01-04 MED ORDER — FUROSEMIDE 10 MG/ML IJ SOLN
120.0000 mg | Freq: Three times a day (TID) | INTRAVENOUS | Status: DC
  Administered 2021-01-04 – 2021-01-05 (×4): 120 mg via INTRAVENOUS
  Filled 2021-01-04 (×2): qty 12
  Filled 2021-01-04: qty 10
  Filled 2021-01-04: qty 12
  Filled 2021-01-04 (×2): qty 10
  Filled 2021-01-04: qty 12

## 2021-01-04 MED ORDER — ~~LOC~~ CARDIAC SURGERY, PATIENT & FAMILY EDUCATION
Freq: Once | Status: DC
  Filled 2021-01-04: qty 1

## 2021-01-04 MED ORDER — POTASSIUM CHLORIDE 10 MEQ/50ML IV SOLN
10.0000 meq | INTRAVENOUS | Status: AC
  Administered 2021-01-04 (×4): 10 meq via INTRAVENOUS
  Filled 2021-01-04 (×4): qty 50

## 2021-01-04 MED ORDER — SODIUM CHLORIDE 0.9% FLUSH: 3.0000 mL | INTRAVENOUS | Status: DC | PRN

## 2021-01-04 MED ORDER — APIXABAN 5 MG PO TABS
5.0000 mg | ORAL_TABLET | Freq: Two times a day (BID) | ORAL | Status: DC
  Administered 2021-01-05 – 2021-01-12 (×15): 5 mg via ORAL
  Filled 2021-01-04 (×15): qty 1

## 2021-01-04 MED ORDER — POTASSIUM CHLORIDE CRYS ER 20 MEQ PO TBCR
40.0000 meq | EXTENDED_RELEASE_TABLET | Freq: Once | ORAL | Status: AC
  Administered 2021-01-04: 40 meq via ORAL
  Filled 2021-01-04: qty 2

## 2021-01-04 MED ORDER — SODIUM CHLORIDE 0.9 % IV SOLN: 250.0000 mL | INTRAVENOUS | Status: DC | PRN

## 2021-01-04 MED ORDER — SODIUM CHLORIDE 0.9% FLUSH
3.0000 mL | Freq: Two times a day (BID) | INTRAVENOUS | Status: DC
  Administered 2021-01-04 – 2021-01-12 (×12): 3 mL via INTRAVENOUS

## 2021-01-04 MED ORDER — POTASSIUM CHLORIDE CRYS ER 20 MEQ PO TBCR
20.0000 meq | EXTENDED_RELEASE_TABLET | ORAL | Status: AC
  Administered 2021-01-04 (×3): 20 meq via ORAL
  Filled 2021-01-04 (×3): qty 1

## 2021-01-04 MED ORDER — INSULIN DETEMIR 100 UNIT/ML ~~LOC~~ SOLN
30.0000 [IU] | Freq: Two times a day (BID) | SUBCUTANEOUS | Status: DC
  Administered 2021-01-04 – 2021-01-12 (×17): 30 [IU] via SUBCUTANEOUS
  Filled 2021-01-04 (×21): qty 0.3

## 2021-01-04 MED FILL — Heparin Sodium (Porcine) Inj 1000 Unit/ML: INTRAMUSCULAR | Qty: 30 | Status: AC

## 2021-01-04 MED FILL — Electrolyte-R (PH 7.4) Solution: INTRAVENOUS | Qty: 5000 | Status: AC

## 2021-01-04 MED FILL — Lidocaine HCl Local Soln Prefilled Syringe 100 MG/5ML (2%): INTRAMUSCULAR | Qty: 5 | Status: AC

## 2021-01-04 MED FILL — Mannitol IV Soln 20%: INTRAVENOUS | Qty: 500 | Status: AC

## 2021-01-04 MED FILL — Sodium Chloride IV Soln 0.9%: INTRAVENOUS | Qty: 2000 | Status: AC

## 2021-01-04 MED FILL — Sodium Bicarbonate IV Soln 8.4%: INTRAVENOUS | Qty: 50 | Status: AC

## 2021-01-04 NOTE — Progress Notes (Addendum)
Advanced Heart Failure Rounding Note   Subjective:    Remains on milrinone 0.25. Co-ox 70%.   On Lasix gtt at 30/hr. Got  5 of metolazone yesterday . Good diuresis, 5.9L in UOP. Wt down an additional 9 lb. CVP 11. Still 14 lb above pre-op wt. Nephrology just evaluated and changed lasix to 120 IV q8h.   Scr improving 4.36->4.28->3.60.   K 3.2   Sitting up in chair. Has mild incisional pain. No other complaints.    Echo LVEF 55% RV moderately HK. Moderate TR. No significant MR. No effusion .   Objective:   Weight Range:  Vital Signs:   Temp:  [97.2 F (36.2 C)-98.1 F (36.7 C)] 97.8 F (36.6 C) (05/17 0850) Pulse Rate:  [85-98] 90 (05/17 0800) Resp:  [11-28] 25 (05/17 0800) BP: (96-195)/(52-154) 96/68 (05/17 0800) SpO2:  [92 %-100 %] 93 % (05/17 0800) Weight:  [131.6 kg] 131.6 kg (05/17 0500) Last BM Date: 01/03/21  Weight change: Filed Weights   01/02/21 0600 01/03/21 0500 01/04/21 0500  Weight: (!) 137.1 kg 135.7 kg 131.6 kg    Intake/Output:   Intake/Output Summary (Last 24 hours) at 01/04/2021 0857 Last data filed at 01/04/2021 0800 Gross per 24 hour  Intake 787.53 ml  Output 6235 ml  Net -5447.47 ml     PHYSICAL EXAM: CVP 11 General:  Well appearing, obese and mildly fatigued, sitting up in chair. No respiratory difficulty HEENT: normal Neck: supple. JVD elevated to jaw  Carotids 2+ bilat; no bruits. No lymphadenopathy or thyromegaly appreciated. Cor: PMI nondisplaced. Regular rate & rhythm. 2/6 SEM LUSB, sternotomy site ok Lungs: clear, no wheezing  Abdomen: obese, soft, nontender, nondistended. No hepatosplenomegaly. No bruits or masses. Good bowel sounds. Extremities: obese LEs no cyanosis, clubbing, rash, 1+ edema + unna boots  Neuro: alert & oriented x 3, cranial nerves grossly intact. moves all 4 extremities w/o difficulty. Affect pleasant.   Telemetry: Sinus 90s Personally reviewed  Labs: Basic Metabolic Panel: Recent Labs  Lab  12/29/20 2117 12/29/20 2318 12/30/20 0414 12/30/20 1641 12/31/20 0306 01/01/21 0353 01/02/21 0433 01/03/21 0419 01/04/21 0339  NA 138   < > 136 133* 134* 130* 129* 129* 131*  K 4.8   < > 4.6 4.3 4.3 4.6 4.2 3.6 3.2*  CL 107  --  102 100 99 96* 93* 93* 92*  CO2 24  --  24 25 25 22 24 25 29   GLUCOSE 157*  --  161* 178* 127* 196* 168* 150* 154*  BUN 30*  --  30* 32* 35* 45* 56* 65* 66*  CREATININE 1.94*  --  2.00* 2.55* 3.03* 4.11* 4.36* 4.28* 3.60*  CALCIUM 8.7*  --  8.9 9.1 9.4 9.1 9.4 9.1 9.1  MG 3.6*  --  3.1* 3.1*  --   --   --   --   --    < > = values in this interval not displayed.    Liver Function Tests: Recent Labs  Lab 01/02/21 0433 01/04/21 0339  AST 30 21  ALT <5 5  ALKPHOS 93 104  BILITOT 0.9 0.8  PROT 7.5 7.1  ALBUMIN 3.5 3.0*   No results for input(s): LIPASE, AMYLASE in the last 168 hours. No results for input(s): AMMONIA in the last 168 hours.  CBC: Recent Labs  Lab 12/31/20 0306 01/01/21 0353 01/02/21 0433 01/03/21 0419 01/04/21 0339  WBC 22.6* 20.2* 17.8* 12.7* 10.1  HGB 9.3* 8.2* 8.2* 7.8* 7.7*  HCT 29.4* 26.0*  25.6* 24.4* 23.8*  MCV 91.0 91.5 89.8 89.1 88.1  PLT 120* 144* 178 196 200    Cardiac Enzymes: No results for input(s): CKTOTAL, CKMB, CKMBINDEX, TROPONINI in the last 168 hours.  BNP: BNP (last 3 results) Recent Labs    03/02/20 1527 11/22/20 1527 12/19/20 1016  BNP 43.7 132.8* 227.9*    ProBNP (last 3 results) No results for input(s): PROBNP in the last 8760 hours.    Other results:  Imaging: US RENAL  Result Date: 01/02/2021 CLINICAL DATA:  Acute renal failure EXAM: RENAL / URINARY TRACT ULTRASOUND COMPLETE COMPARISON:  CT abdomen pelvis 03/04/2020 FINDINGS: Right Kidney: Renal measurements: 10.1 x 5.2 x 5.6 cm = volume: 155 mL. Somewhat limited visualization due to shadowing bowel gas. Echogenicity within normal limits. No mass or hydronephrosis visualized. Left Kidney: Renal measurements: 11.6 x 5.8 x 4.4 cm =  volume: 156 mL. Echogenicity within normal limits. No mass or hydronephrosis visualized. Bladder: Appears normal for degree of bladder distention. Foley catheter in place. Other: None. IMPRESSION: No definite acute finding in the bilateral kidneys. Visualization is somewhat limited by shadowing bowel gas. Electronically Signed   By: Audie Pinto M.D.   On: 01/02/2021 14:29   DG Chest Port 1 View  Result Date: 01/03/2021 CLINICAL DATA:  Chest pain this morning, history of open heart surgery EXAM: PORTABLE CHEST 1 VIEW COMPARISON:  01/02/2021 radiograph, chest CT 01/15/2020 FINDINGS: Right upper extremity approach catheter tip overlies the distal superior vena cava. Unchanged, markedly enlarged cardiac silhouette. Intact sternotomy wires with unchanged left atrial appendage occlusion device, valve annuloplasty, and epicardial pacing wires. Mild diffuse interstitial opacities. There is no pleural effusion or visible pneumothorax. Bones are unchanged. IMPRESSION: Cardiomegaly with mild pulmonary edema. Electronically Signed   By: Maurine Simmering   On: 01/03/2021 08:34   ECHOCARDIOGRAM LIMITED  Result Date: 01/02/2021    ECHOCARDIOGRAM LIMITED REPORT   Patient Name:   [name] Date of Exam: 01/02/2021 Medical Rec #:  [id number]            Height:       67.0 in Accession #:    [id number]           Weight:       302.2 lb Date of Birth:  December 09, 1955            BSA:          2.410 m Patient Age:    65 years             BP:           114/61 mmHg Patient Gender: F                    HR:           77 bpm. Exam Location:  Inpatient Procedure: Limited Echo, Cardiac Doppler and Color Doppler Indications:    Mitral and tricuspid valve repair evaluation  History:        Patient has prior history of Echocardiogram examinations, most                 recent 12/29/2020. CHF, Mitral Valve Disease, Arrythmias:Atrial                 Fibrillation; Risk Factors:Diabetes and Sleep Apnea. MITRAL                 VALVE REPAIR  USING CARBOMEDICS ANNULOFLEX RING SIZE 30 (N/A)  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 RING SIZE 34                 (N/A)                 MAZE (N/A).  Sonographer:    Clayton Lefort RDCS (AE) Referring Phys: [id number] BROADUS Z ATKINS IMPRESSIONS  1. No subcostal views; Elevated mean gradient across aortic valve (mean gradient 13 mmHg) but visually valve opens well; S/P MV repair with mean gradient 7 mmHg and trace MR; S/P TV repair with moderate TR.  2. Left ventricular ejection fraction, by estimation, is 60 to 65%. The left ventricle has normal function. The left ventricle has no regional wall motion abnormalities. There is moderate left ventricular hypertrophy. There is the interventricular septum is flattened in systole and diastole, consistent with right ventricular pressure and volume overload.  3. Right ventricular systolic function is normal. The right ventricular size is mildly enlarged.  4. Left atrial size was moderately dilated.  5. Right atrial size was mildly dilated.  6. The mitral valve has been repaired/replaced. Trivial mitral valve regurgitation. No evidence of mitral stenosis.  7. The tricuspid valve is has been repaired/replaced. Tricuspid valve regurgitation is moderate.  8. The aortic valve is tricuspid. Aortic valve regurgitation is not visualized. No aortic stenosis is present. FINDINGS  Left Ventricle: Left ventricular ejection fraction, by estimation, is 60 to 65%. The left ventricle has normal function. The left ventricle has no regional wall motion abnormalities. The left ventricular internal cavity size was normal in size. There is  moderate left ventricular hypertrophy. The interventricular septum is flattened in systole and diastole, consistent with right ventricular pressure and volume overload. Right Ventricle: The right ventricular size is mildly enlarged. Right ventricular systolic function is normal. Left Atrium: Left atrial size was moderately dilated.  Right Atrium: Right atrial size was mildly dilated. Pericardium: There is no evidence of pericardial effusion. Mitral Valve: The mitral valve has been repaired/replaced. Trivial mitral valve regurgitation. There is a prosthetic annuloplasty ring present in the mitral position. No evidence of mitral valve stenosis. MV peak gradient, 21.2 mmHg. The mean mitral valve gradient is 7.0 mmHg. Tricuspid Valve: The tricuspid valve is has been repaired/replaced. Tricuspid valve regurgitation is moderate . No evidence of tricuspid stenosis. Aortic Valve: The aortic valve is tricuspid. Aortic valve regurgitation is not visualized. No aortic stenosis is present. Aortic valve mean gradient measures 13.0 mmHg. Aortic valve peak gradient measures 25.2 mmHg. Aortic valve area, by VTI measures 1.25 cm. Pulmonic Valve: The pulmonic valve was normal in structure. Pulmonic valve regurgitation is not visualized. No evidence of pulmonic stenosis. Aorta: The aortic root is normal in size and structure. Venous: The inferior vena cava was not well visualized. IAS/Shunts: The interatrial septum is aneurysmal. No atrial level shunt detected by color flow Doppler. Additional Comments: No subcostal views; Elevated mean gradient across aortic valve (mean gradient 13 mmHg) but visually valve opens well; S/P MV repair with mean gradient 7 mmHg and trace MR; S/P TV repair with moderate TR. LEFT VENTRICLE PLAX 2D LVIDd:         4.70 cm LVIDs:         3.90 cm LV PW:         1.50 cm LV IVS:        1.50 cm LVOT diam:     1.80 cm LV SV:         48 LV SV Index:   20  LVOT Area:     2.54 cm  LEFT ATRIUM         Index LA diam:    4.90 cm 2.03 cm/m  AORTIC VALVE AV Area (Vmax):    1.30 cm AV Area (Vmean):   1.19 cm AV Area (VTI):     1.25 cm AV Vmax:           251.00 cm/s AV Vmean:          168.000 cm/s AV VTI:            0.380 m AV Peak Grad:      25.2 mmHg AV Mean Grad:      13.0 mmHg LVOT Vmax:         128.00 cm/s LVOT Vmean:        78.500 cm/s LVOT  VTI:          0.187 m LVOT/AV VTI ratio: 0.49  AORTA Ao Root diam: 3.20 cm MITRAL VALVE             TRICUSPID VALVE MV Area VTI:  0.99 cm   TR Peak grad:   56.9 mmHg MV Peak grad: 21.2 mmHg  TR Vmax:        377.00 cm/s MV Mean grad: 7.0 mmHg MV Vmax:      2.30 m/s   SHUNTS MV Vmean:     117.0 cm/s Systemic VTI:  0.19 m                          Systemic Diam: 1.80 cm Kirk Ruths MD Electronically signed by Kirk Ruths MD Signature Date/Time: 01/02/2021/12:42:34 PM    Final      Medications:     Scheduled Medications: . amiodarone  200 mg Oral Daily  . [START ON 01/05/2021] apixaban  5 mg Oral BID  . aspirin EC  81 mg Oral Daily  . bisacodyl  10 mg Oral Daily   Or  . bisacodyl  10 mg Rectal Daily  . Chlorhexidine Gluconate Cloth  6 each Topical Daily  . docusate sodium  200 mg Oral Daily  . DULoxetine  60 mg Oral Daily  . enoxaparin (LOVENOX) injection  30 mg Subcutaneous QHS  . insulin aspart  0-20 Units Subcutaneous TID WC  . insulin aspart  0-5 Units Subcutaneous QHS  . insulin aspart  3 Units Subcutaneous TID WC  . insulin detemir  30 Units Subcutaneous BID  . lactulose  20 g Oral Daily  . mouth rinse  15 mL Mouth Rinse BID  . pantoprazole  40 mg Oral Daily  . potassium chloride  20 mEq Oral Q4H  . rosuvastatin  10 mg Oral Daily    Infusions: . sodium chloride Stopped (12/30/20 1019)  . sodium chloride    . sodium chloride 20 mL/hr at 12/29/20 1533  . furosemide 120 mg (01/04/21 0810)  . lactated ringers    . lactated ringers Stopped (12/31/20 1720)  . milrinone 0.25 mcg/kg/min (01/04/21 0800)  . potassium chloride    . promethazine (PHENERGAN) injection (IM or IVPB) 6.25 mg (01/03/21 0829)    PRN Medications: sodium chloride, dextrose, metoprolol tartrate, morphine injection, ondansetron (ZOFRAN) IV, oxyCODONE, promethazine (PHENERGAN) injection (IM or IVPB), sodium chloride flush, sodium chloride flush, traMADol   Assessment/Plan:   1. Valvular heart disease  s/p MVR/TVR  on 5/12 - Severe TRs/p annuloplasty. Severe MRs/p annuloplasty on 12/30/20 - Echo 01/02/21 LVEF 55% RV moderately HK. Moderate TR. No  significant MR. No effusion.  2. Acute on chronic systolic HF with R>>L HF - TEE 4/22 EF 45-50% RV moderately HK  - Echo LVEF 55% RV moderately HK. Moderate TR. No significant MR. No effusion. - co-ox 70% on milrinone 0.25 - remains volume overloaded but responding to diuretics. Weigh up 14 pounds from pre-op. CVP 11 - Creatinine improving, 1.9 -> 4.0 -> 4.36->4.28->3.6. No indication for HD/CVVHD currently. Hopefully we can avoid. - Nephrology following and changed from lasix gtt to scheduled 120 IV Q8.  - C/w Unna boots    3. AKI on CKD 3b post-op  - baseline SCr 1.6-1.8 - Scr 4.1 -> 4.36->4.28->3.60 today. Likely post-op ATN - Plan as above - Keep MAP >= 70 - diuretics per nephrology   4. Longstanding Persistent Atrial Fibrillation s/p Maze - CHADSVASC 4+  - ASA 324 per TCTS -off digoxin with AKI  - continue low dose amiodarone 200 mg PO Daily - AC per TCTS  5. Morbid Obesity - needs weight loss  6. DM2 - SSI   7. Hyponatremia - improving 129>> 131 - likely 2/2 hypervolemia - c/w diuresis   8. Hypokalemia - K 3.2 - supp w/ KCl   Continue to ambulate. Per CT surgery, to transfer to PCU today.    Length of Stay: [place] PA-C  01/04/2021, 8:57 AM  Advanced Heart Failure Team Pager [phone] (M-F; 7a - 4p)  Please contact [facility] for night-coverage after hours (4p -7a ) and weekends on [email or web address]  Patient seen and examined with the above-signed Advanced Practice Provider and/or Housestaff. I personally reviewed laboratory data, imaging studies and relevant notes. I independently examined the patient and formulated the important aspects of the plan. I have edited the note to reflect any of my changes or salient points. I have personally discussed the plan with the patient and/or  family.  Remains on milrinone. Co-ox 70%. Diuresed very well with lasix gtt and metolazone. Weight down 9 pounds. CVP now 11. Renal changed to intermittent lasix dosing. SCR improved. Was able to ambulate hall. Rhythm stable.   General:  Sitting up  No resp difficulty HEENT: normal Neck: supple.JVP to jaw  Carotids 2+ bilat; no bruits. No lymphadenopathy or thryomegaly appreciated. Cor: sternal wound ok Regular rate & rhythm. 2/6 TR Lungs: clear Abdomen: obese soft, nontender, nondistended. No hepatosplenomegaly. No bruits or masses. Good bowel sounds. Extremities: no cyanosis, clubbing, rash,1+ edema Neuro: alert & orientedx3, cranial nerves grossly intact. moves all 4 extremities w/o difficulty. Affect pleasant  Doing well with milrinone and IV diuresis. Continue diuresis. Will start milrinone wean tomorrow.   Glori Bickers, MD  9:37 AM

## 2021-01-04 NOTE — Progress Notes (Signed)
Nephrology Follow-Up Consult note   Assessment/Recommendations: [name] is a/an 65 y.o. female with a past medical history significant for OSA, HTN, DVT, DM2, COPD, admitted for CHF exacerbation with mitral and tricuspid valvular disease status post repair.       Nonoliguric AKI/cardiorenal syndrome/ATN: Likely secondary to ATN associated with surgery and contribution of cardiorenal syndrome with volume overload.  Finally hitting the tipping point with creatinine improving significantly -Decrease Lasix to 120 mg every 8 hours; no metolazone today unless urine output drops off significantly -Monitor weight, uop, and cvp -No urgent need for dialysis, continue medical mgmt -HF team and surgery following; appreciate help -Continue to monitor daily Cr, Dose meds for GFR -Monitor Daily I/Os, Daily weight  -Maintain MAP>65 for optimal renal perfusion.  -Avoid nephrotoxic medications including NSAIDs and Vanc/Zosyn combo  CHF exacerbation c/b valvular disease s/p repair: on 5/12. On milrinone. Appreciate HF and CT surg help Atrial fibrillation: s/p Maze. On amiodarone Morbid obesity: long term issue DM2: mgmt per primary Hyponatremia: Sodium 131.  Likely related to volume excess.  Monitor daily Hypokalemia: Potassium 3.2 today.  Ongoing oral repletion Anemia: Hemoglobin 7.8.  Multifactorial.  Continue to monitor.  No ESA   Recommendations conveyed to primary service.    [facility] 01/04/2021 9:34 AM  ___________________________________________________________  CC: AKI on CKD  Interval History/Subjective: Excellent urine output almost 6 L.  CVP 11.  Still above preop weight but significantly improved.  Patient states she feels much better today.   Medications:  Current Facility-Administered Medications  Medication Dose Route Frequency Provider Last Rate Last Admin  . 0.45 % sodium chloride infusion   Intravenous Continuous PRN Elgie Collard, PA-C   Stopped at 12/30/20 1019  . 0.9 %  sodium chloride infusion  250 mL Intravenous Continuous Conte, Tessa N, PA-C      . 0.9 %  sodium chloride infusion   Intravenous Continuous Elgie Collard, PA-C 20 mL/hr at 12/29/20 1533 New Bag/Given (Non-Interop) at 12/29/20 1533  . amiodarone (PACERONE) tablet 200 mg  200 mg Oral Daily Wilson Singer I, RPH   200 mg at 01/03/21 1037  . [START ON 01/05/2021] apixaban (ELIQUIS) tablet 5 mg  5 mg Oral BID [name], MD      . aspirin EC tablet 81 mg  81 mg Oral Daily [name], MD   81 mg at 01/03/21 1038  . bisacodyl (DULCOLAX) EC tablet 10 mg  10 mg Oral Daily Nicholes Rough N, PA-C   10 mg at 01/03/21 1037   Or  . bisacodyl (DULCOLAX) suppository 10 mg  10 mg Rectal Daily Harriet Pho, Tessa N, PA-C      . Chlorhexidine Gluconate Cloth 2 % PADS 6 each  6 each Topical Daily [name], MD   6 each at 01/03/21 1035  . dextrose 50 % solution 0-50 mL  0-50 mL Intravenous PRN Harriet Pho, Tessa N, PA-C      . docusate sodium (COLACE) capsule 200 mg  200 mg Oral Daily Wilson Singer I, RPH   200 mg at 01/03/21 1038  . DULoxetine (CYMBALTA) DR capsule 60 mg  60 mg Oral Daily Elgie Collard, PA-C   60 mg at 01/03/21 1036  . enoxaparin (LOVENOX) injection 30 mg  30 mg Subcutaneous QHS Einar Grad, RPH   30 mg at 01/03/21 2127  . furosemide (LASIX) 120 mg in dextrose 5 % 50 mL IVPB  120 mg Intravenous Q8H Adolph Clutter,  Shaune Pollack, MD   Stopped at 01/04/21 [phone]  . insulin aspart (novoLOG) injection 0-20 Units  0-20 Units Subcutaneous TID WC [name], MD   4 Units at 01/03/21 1630  . insulin aspart (novoLOG) injection 0-5 Units  0-5 Units Subcutaneous QHS [name], MD      . insulin aspart (novoLOG) injection 3 Units  3 Units Subcutaneous TID WC [name], MD   3 Units at 01/04/21 0804  . insulin detemir (LEVEMIR) injection 30 Units  30 Units Subcutaneous BID [name], MD      .  lactated ringers infusion   Intravenous Continuous Harriet Pho, Tessa N, PA-C      . lactated ringers infusion   Intravenous Continuous Elgie Collard, [place]   Stopped at 12/31/20 1720  . lactulose (CHRONULAC) 10 GM/15ML solution 20 g  20 g Oral Daily [name], MD   20 g at 01/03/21 1325  . MEDLINE mouth rinse  15 mL Mouth Rinse BID [name], MD   15 mL at 01/03/21 2127  . metoprolol tartrate (LOPRESSOR) injection 2.5-5 mg  2.5-5 mg Intravenous Q2H PRN Conte, Tessa N, PA-C      . milrinone (PRIMACOR) 20 MG/100 ML (0.2 mg/mL) infusion  0.25 mcg/kg/min Intravenous Continuous Bensimhon, Shaune Pascal, MD 9.74 mL/hr at 01/04/21 0900 0.25 mcg/kg/min at 01/04/21 0900  . morphine 2 MG/ML injection 1-4 mg  1-4 mg Intravenous Q1H PRN Nicholes Rough N, PA-C   2 mg at 12/30/20 1939  . ondansetron (ZOFRAN) injection 4 mg  4 mg Intravenous Q6H PRN Harriet Pho, Tessa N, PA-C   4 mg at 01/03/21 1329  . oxyCODONE (Oxy IR/ROXICODONE) immediate release tablet 5-10 mg  5-10 mg Oral Q3H PRN Wilson Singer I, RPH   10 mg at 01/03/21 1333  . pantoprazole (PROTONIX) EC tablet 40 mg  40 mg Oral Daily [name], MD   40 mg at 01/03/21 1036  . potassium chloride 10 mEq in 50 mL *CENTRAL LINE* IVPB  10 mEq Intravenous Q1 Hr x 4 [name], MD      . potassium chloride SA (KLOR-CON) CR tablet 20 mEq  20 mEq Oral Q4H [name], MD   20 mEq at 01/04/21 0553  . promethazine (PHENERGAN) 6.25 mg in sodium chloride 0.9 % 50 mL IVPB  6.25 mg Intravenous Q6H PRN Wonda Olds, MD 200 mL/hr at 01/03/21 0829 6.25 mg at 01/03/21 0829  . rosuvastatin (CRESTOR) tablet 10 mg  10 mg Oral Daily Wilson Singer I, RPH   10 mg at 01/03/21 1037  . sodium chloride flush (NS) 0.9 % injection 10-40 mL  10-40 mL Intracatheter PRN [name], MD      . sodium chloride flush (NS) 0.9 % injection 3 mL  3 mL Intravenous PRN Conte, Tessa N, PA-C      . traMADol Veatrice Bourbon) tablet 50-100 mg   50-100 mg Oral Q4H PRN Wilson Singer I, RPH   100 mg at 01/03/21 0809      Review of Systems: 10 systems reviewed and negative except per interval history/subjective  Physical Exam: Vitals:   01/04/21 0850 01/04/21 0900  BP:  124/65  Pulse:  92  Resp:  14  Temp: 97.8 F (36.6 C)   SpO2:  92%   Total I/O In: 88.2 [I.V.:26.2; IV Piggyback:62] Out: 525 [Urine:525]  Intake/Output Summary (Last 24 hours) at 01/04/2021 0934 Last data filed at 01/04/2021 0900 Gross per 24  hour  Intake 796.64 ml  Output 6135 ml  Net -5338.36 ml   Constitutional: Sitting in chair, no distress ENMT: ears and nose without scars or lesions, MMM CV: normal rate Respiratory: Bilateral chest rise with no increased work of breathing Gastrointestinal: soft, non-tender, no palpable masses or hernias Skin: no visible lesions or rashes Psych: alert, judgement/insight appropriate, appropriate mood and affect   Test Results I personally reviewed new and old clinical labs and radiology tests Lab Results  Component Value Date   NA 131 (L) 01/04/2021   K 3.2 (L) 01/04/2021   CL 92 (L) 01/04/2021   CO2 29 01/04/2021   BUN 66 (H) 01/04/2021   CREATININE 3.60 (H) 01/04/2021   CALCIUM 9.1 01/04/2021   ALBUMIN 3.0 (L) 01/04/2021   PHOS 3.2 03/08/2020

## 2021-01-04 NOTE — Progress Notes (Signed)
Physical Therapy Treatment Patient Details Name: [name] MRN: [id number] DOB: 1956-06-29 Today's Date: 01/04/2021    History of Present Illness Patient is a 65 y/o female admitted on 12/19/20 with SOB, chest pain, N/V and emesis. Admitted with CHF exacerbation. s/p MVR,TVR and Maze 12/29/20. PMHx: HTN. DM2, A-fib, CHF, OSA, DVT, COPD.    PT Comments    Pt sitting up in chair agreeable to ambulation if she can get back in the bed afterward. RN agrees to return to bed as she has been sitting up since 6:30 am. Pt is limited in safe mobility by 8/10 chest pain especially in movement and generalized weakness and decreased endurance. Pt is min A for sit>stand and light min A for ambulation with EVA walker. Pt requires 1x seated rest break during ambulation. Pt noted to have slight knee buckling with last few steps towards bed but able to self steady. Pt requiring maxA for returning LE to bed. D/c plans remain appropriate at this time. PT will continue to follow acutely.    Follow Up Recommendations  Home health PT;Supervision - Intermittent     Equipment Recommendations  None recommended by PT       Precautions / Restrictions Precautions Precautions: Fall Precaution Comments: sternal precautions, knees buckle, watch HR Restrictions Weight Bearing Restrictions: Yes (sternal precautions)    Mobility  Bed Mobility Overal bed mobility: Needs Assistance Bed Mobility: Sit to Sidelying         Sit to sidelying: Max assist General bed mobility comments: OOB in recliner on entry, max A for return to supine, maximal cuing for coming down onto side instead of leaning backward to get back on bed    Transfers Overall transfer level: Needs assistance Equipment used:  [place] walker) Transfers: Sit to/from Stand Sit to Stand: Min assist         General transfer comment: cues for technique, prefers pillow use to brace with standing today, 2x sit>stand from  recliner  Ambulation/Gait Ambulation/Gait assistance: Min guard Gait Distance (Feet): 120 Feet (1x seated rest break) Assistive device:  Ethelene Hal) Gait Pattern/deviations: Step-through pattern;Decreased stride length;Wide base of support Gait velocity: decreased Gait velocity interpretation: <1.31 ft/sec, indicative of household ambulator General Gait Details: slow, steady waddling gait, with 1x seated rest break, pt with knee buckling noted with turning to sit on EoB, max HR noted 115 bpm         Balance Overall balance assessment: History of Falls;Needs assistance Sitting-balance support: Feet supported;No upper extremity supported Sitting balance-Leahy Scale: Good       Standing balance-Leahy Scale: Poor Standing balance comment: reliant in walker                            Cognition Arousal/Alertness: Awake/alert Behavior During Therapy: WFL for tasks assessed/performed Overall Cognitive Status: Within Functional Limits for tasks assessed                                           General Comments General comments (skin integrity, edema, etc.): HR max noted with ambulation 115 bpm. Edema improved with presence of UNNA boots      Pertinent Vitals/Pain Pain Assessment: 0-10 Pain Score: 8  Pain Location: chest incision Pain Descriptors / Indicators: Discomfort Pain Intervention(s): Limited activity within patient's tolerance;Monitored during session;Premedicated before session;Repositioned    Home Living Family/patient expects  to be discharged to:: Private residence Living Arrangements: Children Available Help at Discharge: Family;Available 24 hours/day Type of Home: Apartment Home Access: Stairs to enter Entrance Stairs-Rails: Left Home Layout: One level Home Equipment: Cane - single point;Walker - 4 wheels;Shower seat;Wheelchair - manual Additional Comments: wears 02 as needed.    Prior Function Level of Independence: Independent  with assistive device(s)      Comments: Independent for household mobility sometimes furniture walking, uses [facility] for community ambulation. Walks short distances only due to SOB. Does not drive. Does some IADLs, does own ADLs. Hx of falls, wears 02 as needed.   PT Goals (current goals can now be found in the care plan section) Acute Rehab PT Goals Patient Stated Goal: return to PLOF PT Goal Formulation: With patient Time For Goal Achievement: 01/03/21 Potential to Achieve Goals: Good Progress towards PT goals: Progressing toward goals    Frequency    Min 3X/week      PT Plan Current plan remains appropriate    Co-evaluation              AM-PAC PT "6 Clicks" Mobility   Outcome Measure  Help needed turning from your back to your side while in a flat bed without using bedrails?: None Help needed moving from lying on your back to sitting on the side of a flat bed without using bedrails?: None Help needed moving to and from a bed to a chair (including a wheelchair)?: A Little Help needed standing up from a chair using your arms (e.g., wheelchair or bedside chair)?: A Little Help needed to walk in hospital room?: A Little Help needed climbing 3-5 steps with a railing? : A Lot 6 Click Score: 19    End of Session Equipment Utilized During Treatment: Gait belt Activity Tolerance: Patient tolerated treatment well Patient left: in bed;with call bell/phone within reach;with bed alarm set Nurse Communication: Mobility status PT Visit Diagnosis: Pain;Muscle weakness (generalized) (M62.81);Unsteadiness on feet (R26.81);Difficulty in walking, not elsewhere classified (R26.2) Pain - part of body:  (chest)     Time: 4270-6237 PT Time Calculation (min) (ACUTE ONLY): 48 min  Charges:  $Gait Training: 23-37 mins                     Emi Lymon B. Migdalia Dk PT, DPT Acute Rehabilitation Services Pager [phone] Office [phone]    [place] 01/04/2021, 4:27  PM

## 2021-01-04 NOTE — Progress Notes (Signed)
6 Days Post-Op Procedure(s) (LRB): MITRAL VALVE REPAIR USING CARBOMEDICS ANNULOFLEX RING SIZE 30 (N/A) 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 RING SIZE 34 (N/A) MAZE (N/A) TRANSESOPHAGEAL ECHOCARDIOGRAM (TEE) (N/A) Subjective: Pain better this AM. Ambulated around "short block" this AM  Objective: Vital signs in last 24 hours: Temp:  [97.2 F (36.2 C)-98.1 F (36.7 C)] 97.6 F (36.4 C) (05/17 0755) Pulse Rate:  [85-98] 91 (05/17 0755) Cardiac Rhythm: Normal sinus rhythm;Heart block (05/17 0400) Resp:  [11-28] 18 (05/17 0755) BP: (105-195)/(52-154) 114/57 (05/17 0500) SpO2:  [92 %-100 %] 92 % (05/17 0755) Weight:  [131.6 kg] 131.6 kg (05/17 0500)  Hemodynamic parameters for last 24 hours:    Intake/Output from previous day: 05/16 0701 - 05/17 0700 In: 838.3 [P.O.:100; I.V.:590.7; IV Piggyback:147.6] Out: 5895 [Urine:5895] Intake/Output this shift: Total I/O In: -  Out: 525 [Urine:525]  General appearance: alert, cooperative and no distress Neurologic: intact Heart: regular rate and rhythm Lungs: diminished breath sounds bibasilar Abdomen: normal findings: soft, non-tender Wound: clean and dry  Lab Results: Recent Labs    01/03/21 0419 01/04/21 0339  WBC 12.7* 10.1  HGB 7.8* 7.7*  HCT 24.4* 23.8*  PLT 196 200   BMET:  Recent Labs    01/03/21 0419 01/04/21 0339  NA 129* 131*  K 3.6 3.2*  CL 93* 92*  CO2 25 29  GLUCOSE 150* 154*  BUN 65* 66*  CREATININE 4.28* 3.60*  CALCIUM 9.1 9.1    PT/INR: No results for input(s): LABPROT, INR in the last 72 hours. ABG    Component Value Date/Time   PHART 7.368 12/30/2020 0050   HCO3 23.0 12/30/2020 0050   TCO2 24 12/30/2020 0050   ACIDBASEDEF 2.0 12/30/2020 0050   O2SAT 70.0 01/04/2021 0353   CBG (last 3)  Recent Labs    01/03/21 1532 01/03/21 2045 01/04/21 0543  GLUCAP 164* 93 97    Assessment/Plan: S/P Procedure(s) (LRB): MITRAL VALVE REPAIR USING CARBOMEDICS ANNULOFLEX RING SIZE 30  (N/A) 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 RING SIZE 34 (N/A) MAZE (N/A) TRANSESOPHAGEAL ECHOCARDIOGRAM (TEE) (N/A) - Continues to improve  CV- in SR, co-ox 70 on milrinone 0.25 mcg/kg/min RESP- continue IS RENAL- creatinine down to 3.6 and good diuresis  Nephrology changed to intermittent Lasix  Hypokalemia- supplement ENDO- CBG better controlled  Decrease levemir, continue meal coverage and SSI GI- + BM, appetite a little better Deconditioning- continue cardiac rehab Transfer to progressive unit   LOS: 14 days    [name] 01/04/2021

## 2021-01-05 ENCOUNTER — Inpatient Hospital Stay (HOSPITAL_COMMUNITY): Payer: Medicare Other

## 2021-01-05 DIAGNOSIS — I340 Nonrheumatic mitral (valve) insufficiency: Secondary | ICD-10-CM | POA: Diagnosis not present

## 2021-01-05 DIAGNOSIS — I5033 Acute on chronic diastolic (congestive) heart failure: Secondary | ICD-10-CM | POA: Diagnosis not present

## 2021-01-05 LAB — RENAL FUNCTION PANEL
Albumin: 2.8 g/dL — ABNORMAL LOW (ref 3.5–5.0)
Anion gap: 8 (ref 5–15)
BUN: 64 mg/dL — ABNORMAL HIGH (ref 8–23)
CO2: 34 mmol/L — ABNORMAL HIGH (ref 22–32)
Calcium: 9.2 mg/dL (ref 8.9–10.3)
Chloride: 93 mmol/L — ABNORMAL LOW (ref 98–111)
Creatinine, Ser: 2.73 mg/dL — ABNORMAL HIGH (ref 0.44–1.00)
GFR, Estimated: 19 mL/min — ABNORMAL LOW (ref >60)
Glucose, Bld: 139 mg/dL — ABNORMAL HIGH (ref 70–99)
Phosphorus: 3.2 mg/dL (ref 2.5–4.6)
Potassium: 3.3 mmol/L — ABNORMAL LOW (ref 3.5–5.1)
Sodium: 135 mmol/L (ref 135–145)

## 2021-01-05 LAB — COOXEMETRY PANEL
Carboxyhemoglobin: 1.7 % — ABNORMAL HIGH (ref 0.5–1.5)
Methemoglobin: 1.1 % (ref 0.0–1.5)
O2 Saturation: 60.3 %
Total hemoglobin: 9 g/dL — ABNORMAL LOW (ref 12.0–16.0)

## 2021-01-05 LAB — CBC
HCT: 23.6 % — ABNORMAL LOW (ref 36.0–46.0)
Hemoglobin: 7.7 g/dL — ABNORMAL LOW (ref 12.0–15.0)
MCH: 28.9 pg (ref 26.0–34.0)
MCHC: 32.6 g/dL (ref 30.0–36.0)
MCV: 88.7 fL (ref 80.0–100.0)
Platelets: 226 10*3/uL (ref 150–400)
RBC: 2.66 MIL/uL — ABNORMAL LOW (ref 3.87–5.11)
RDW: 14.5 % (ref 11.5–15.5)
WBC: 10.1 10*3/uL (ref 4.0–10.5)
nRBC: 0 % (ref 0.0–0.2)

## 2021-01-05 LAB — GLUCOSE, CAPILLARY
Glucose-Capillary: 175 mg/dL — ABNORMAL HIGH (ref 70–99)
Glucose-Capillary: 126 mg/dL — ABNORMAL HIGH (ref 70–99)
Glucose-Capillary: 131 mg/dL — ABNORMAL HIGH (ref 70–99)
Glucose-Capillary: 152 mg/dL — ABNORMAL HIGH (ref 70–99)

## 2021-01-05 MED ORDER — POTASSIUM CHLORIDE 10 MEQ/50ML IV SOLN
10.0000 meq | INTRAVENOUS | Status: AC
  Administered 2021-01-05 (×3): 10 meq via INTRAVENOUS
  Filled 2021-01-05 (×3): qty 50

## 2021-01-05 MED ORDER — FUROSEMIDE 10 MG/ML IJ SOLN
120.0000 mg | Freq: Two times a day (BID) | INTRAVENOUS | Status: DC
  Administered 2021-01-05 – 2021-01-06 (×2): 120 mg via INTRAVENOUS
  Filled 2021-01-05: qty 12
  Filled 2021-01-05: qty 10
  Filled 2021-01-05 (×2): qty 12

## 2021-01-05 MED ORDER — POTASSIUM CHLORIDE CRYS ER 20 MEQ PO TBCR
40.0000 meq | EXTENDED_RELEASE_TABLET | Freq: Two times a day (BID) | ORAL | Status: DC
  Administered 2021-01-05 (×2): 40 meq via ORAL
  Filled 2021-01-05 (×2): qty 2

## 2021-01-05 NOTE — Progress Notes (Signed)
Pt arrived to unit from 3east VSS, A/O x 4,  CCMD called ,CHG given, pt oriented to unit,Will continue to monitor. Midline incision open to air  clean and  dry .   Phoebe Sharps, RN

## 2021-01-05 NOTE — Progress Notes (Signed)
Occupational Therapy Treatment Patient Details Name: [name] MRN: [id number] DOB: 04-18-1956 Today's Date: 01/05/2021    History of present illness Patient is a 65 y/o female admitted on 12/19/20 with SOB, chest pain, N/V and emesis. Admitted with CHF exacerbation. s/p MVR,TVR and Maze 12/29/20. PMHx: HTN. DM2, A-fib, CHF, OSA, DVT, COPD.   OT comments  Pt with increased incision pain, had received pain meds just prior to arrival. Focus of session on reinforcing sternal precautions, pt has written handout as well. Declined OOB, but stated she would get up later this morning. Participated in grooming and UB dressing. Will continue to follow.   Follow Up Recommendations  Home health OT;Supervision/Assistance - 24 hour    Equipment Recommendations  Tub/shower bench;Other (comment) (AE)    Recommendations for Other Services      Precautions / Restrictions Precautions Precautions: Fall Precaution Comments: sternal precautions, knees buckle, watch HR Restrictions Weight Bearing Restrictions: No       Mobility Bed Mobility Overal bed mobility: Needs Assistance Bed Mobility: Rolling;Sidelying to Sit;Sit to Sidelying Rolling: Min assist Sidelying to sit: Mod assist     Sit to sidelying: Mod assist General bed mobility comments: continuing to work on technique, adhering to sternal precautions    Transfers                 General transfer comment: pt declined OOB    Balance Overall balance assessment: History of Falls;Needs assistance Sitting-balance support: Feet supported;No upper extremity supported Sitting balance-Leahy Scale: Good                                     ADL either performed or assessed with clinical judgement   ADL Overall ADL's : Needs assistance/impaired     Grooming: Wash/dry hands;Wash/dry face;Oral care;Sitting;Set up           Upper Body Dressing : Minimal assistance;Sitting                            Vision       Perception     Praxis      Cognition Arousal/Alertness: Awake/alert Behavior During Therapy: Flat affect Overall Cognitive Status: Impaired/Different from baseline Area of Impairment: Memory                     Memory: Decreased short-term memory;Decreased recall of precautions                  Exercises     Shoulder Instructions       General Comments      Pertinent Vitals/ Pain       Pain Assessment: Faces Faces Pain Scale: Hurts even more Pain Location: chest incision Pain Descriptors / Indicators: Discomfort Pain Intervention(s): Monitored during session;Premedicated before session;Repositioned  Home Living                                          Prior Functioning/Environment              Frequency  Min 2X/week        Progress Toward Goals  OT Goals(current goals can now be found in the care plan section)  Progress towards OT goals: Not progressing toward goals - comment (pt with fatigue and pain,  forgetful of precautions)  Acute Rehab OT Goals Patient Stated Goal: return to PLOF OT Goal Formulation: With patient Time For Goal Achievement: 01/17/21 Potential to Achieve Goals: Good  Plan Discharge plan remains appropriate    Co-evaluation                 AM-PAC OT "6 Clicks" Daily Activity     Outcome Measure   Help from another person eating meals?: None Help from another person taking care of personal grooming?: A Little Help from another person toileting, which includes using toliet, bedpan, or urinal?: Total Help from another person bathing (including washing, rinsing, drying)?: A Lot Help from another person to put on and taking off regular upper body clothing?: A Little Help from another person to put on and taking off regular lower body clothing?: A Lot 6 Click Score: 15    End of Session    OT Visit Diagnosis: Unsteadiness on feet (R26.81);History of falling  (Z91.81);Pain   Activity Tolerance Patient tolerated treatment well   Patient Left in bed;with call bell/phone within reach;with nursing/sitter in room   Nurse Communication          Time: 2951-8841 OT Time Calculation (min): 16 min  Charges: OT General Charges $OT Visit: 1 Visit OT Treatments $Self Care/Home Management : 8-22 mins  Nestor Lewandowsky, OTR/L Acute Rehabilitation Services Pager: [phone] Office: [phone]   Malka So 01/05/2021, 9:55 AM

## 2021-01-05 NOTE — Care Management Important Message (Signed)
Important Message  Patient Details  Name: [name] MRN: [id number] Date of Birth: August 23, 1955   Medicare Important Message Given:  Yes     Shelda Altes 01/05/2021, 9:30 AM

## 2021-01-05 NOTE — Progress Notes (Signed)
CARDIAC REHAB PHASE I   PRE:  Rate/Rhythm: 90 SR 1HB  BP:  Supine: 106/72  Sitting:   Standing:    SaO2: 96%RA  MODE:  Ambulation: 100 ft   POST:  Rate/Rhythm: 100 SR  BP:  Supine:   Sitting: 120/79  Standing:    SaO2: 95%RA 1433-1456 Pt walked 100 ft on RA with EVA, gait belt and asst x 2. Followed with rollator but pt did not need to sit. Tolerated well. To recliner with call bell. Encouraged IS. Rocked to stand.   Graylon Good, RN BSN  01/05/2021 2:54 PM

## 2021-01-05 NOTE — Progress Notes (Addendum)
Advanced Heart Failure Rounding Note   Subjective:    Remains on milrinone 0.25. Co-ox 60% Now on bolus lasix. Out another 5L. Weight down another 6 pounds (still about 5 pounds up from pre-op)  Breathing ok. No orthopnea or PND. + incisional pain. SCr continues to improve 3.1 -> 2.7   CXR this am is clear. Personally reviewed  Echo LVEF 55% RV moderately HK. Moderate TR. No significant MR. No effusion .   Objective:   Weight Range:  Vital Signs:   Temp:  [97.8 F (36.6 C)-98.5 F (36.9 C)] 98.5 F (36.9 C) (05/18 0436) Pulse Rate:  [88-101] 92 (05/18 0436) Resp:  [14-21] 18 (05/18 0436) BP: (98-129)/(43-77) 105/51 (05/18 0436) SpO2:  [91 %-98 %] 97 % (05/18 0436) Weight:  [128.3 kg-130.8 kg] 128.3 kg (05/18 0436) Last BM Date: 01/04/21  Weight change: Filed Weights   01/04/21 0500 01/04/21 1837 01/05/21 0436  Weight: 131.6 kg 130.8 kg 128.3 kg    Intake/Output:   Intake/Output Summary (Last 24 hours) at 01/05/2021 0827 Last data filed at 01/05/2021 0441 Gross per 24 hour  Intake 1010.32 ml  Output 4450 ml  Net -3439.68 ml     PHYSICAL EXAM: General:  Sitting in chair . No resp difficulty HEENT: normal Neck: supple. no JVD. Carotids 2+ bilat; no bruits. No lymphadenopathy or thryomegaly appreciated. Cor: Sternal wound ok PMI nondisplaced. Regular rate & rhythm. 2/6 TR Lungs: clear Abdomen: soft, nontender, nondistended. No hepatosplenomegaly. No bruits or masses. Good bowel sounds. Extremities: no cyanosis, clubbing, rash, tr edema Neuro: alert & orientedx3, cranial nerves grossly intact. moves all 4 extremities w/o difficulty. Affect pleasant   Telemetry: Sinus 90s Personally reviewed  Labs: Basic Metabolic Panel: Recent Labs  Lab 12/29/20 2117 12/29/20 2318 12/30/20 0414 12/30/20 1641 12/31/20 0306 01/02/21 0433 01/03/21 0419 01/04/21 0339 01/04/21 1452 01/05/21 0504  NA 138   < > 136 133*   < > 129* 129* 131* 132* 135  K 4.8   < > 4.6  4.3   < > 4.2 3.6 3.2* 3.5 3.3*  CL 107  --  102 100   < > 93* 93* 92* 91* 93*  CO2 24  --  24 25   < > 24 25 29 30  34*  GLUCOSE 157*  --  161* 178*   < > 168* 150* 154* 174* 139*  BUN 30*  --  30* 32*   < > 56* 65* 66* 68* 64*  CREATININE 1.94*  --  2.00* 2.55*   < > 4.36* 4.28* 3.60* 3.09* 2.73*  CALCIUM 8.7*  --  8.9 9.1   < > 9.4 9.1 9.1 8.9 9.2  MG 3.6*  --  3.1* 3.1*  --   --   --   --   --   --   PHOS  --   --   --   --   --   --   --   --   --  3.2   < > = values in this interval not displayed.    Liver Function Tests: Recent Labs  Lab 01/02/21 0433 01/04/21 0339 01/05/21 0504  AST 30 21  --   ALT <5 5  --   ALKPHOS 93 104  --   BILITOT 0.9 0.8  --   PROT 7.5 7.1  --   ALBUMIN 3.5 3.0* 2.8*   No results for input(s): LIPASE, AMYLASE in the last 168 hours. No results for input(s): AMMONIA  in the last 168 hours.  CBC: Recent Labs  Lab 01/01/21 0353 01/02/21 0433 01/03/21 0419 01/04/21 0339 01/05/21 0504  WBC 20.2* 17.8* 12.7* 10.1 10.1  HGB 8.2* 8.2* 7.8* 7.7* 7.7*  HCT 26.0* 25.6* 24.4* 23.8* 23.6*  MCV 91.5 89.8 89.1 88.1 88.7  PLT 144* 178 196 200 226    Cardiac Enzymes: No results for input(s): CKTOTAL, CKMB, CKMBINDEX, TROPONINI in the last 168 hours.  BNP: BNP (last 3 results) Recent Labs    03/02/20 1527 11/22/20 1527 12/19/20 1016  BNP 43.7 132.8* 227.9*    ProBNP (last 3 results) No results for input(s): PROBNP in the last 8760 hours.    Other results:  Imaging: DG Chest Port 1 View  Result Date: 01/05/2021 CLINICAL DATA:  Status post mitral valve repair, shortness of breath. EXAM: PORTABLE CHEST 1 VIEW COMPARISON:  Jan 03, 2021. FINDINGS: Stable cardiomegaly. Right-sided PICC line is unchanged. No pneumothorax or pleural effusion is noted. Lungs are clear. Bony thorax is unremarkable. IMPRESSION: No active disease. Electronically Signed   By: Marijo Conception M.D.   On: 01/05/2021 08:12     Medications:     Scheduled  Medications: . amiodarone  200 mg Oral Daily  . apixaban  5 mg Oral BID  . aspirin EC  81 mg Oral Daily  . bisacodyl  10 mg Oral Daily   Or  . bisacodyl  10 mg Rectal Daily  . Chlorhexidine Gluconate Cloth  6 each Topical Daily  . [place] [facility] Patient [facility] Family Education   Does not apply Once  . docusate sodium  200 mg Oral Daily  . DULoxetine  60 mg Oral Daily  . insulin aspart  0-20 Units Subcutaneous TID WC  . insulin aspart  0-5 Units Subcutaneous QHS  . insulin aspart  3 Units Subcutaneous TID WC  . insulin detemir  30 Units Subcutaneous BID  . lactulose  20 g Oral Daily  . mouth rinse  15 mL Mouth Rinse BID  . pantoprazole  40 mg Oral Daily  . potassium chloride  40 mEq Oral BID  . rosuvastatin  10 mg Oral Daily  . sodium chloride flush  3 mL Intravenous Q12H    Infusions: . sodium chloride    . furosemide    . milrinone 0.25 mcg/kg/min (01/05/21 0340)  . potassium chloride 10 mEq (01/05/21 0813)  . promethazine (PHENERGAN) injection (IM or IVPB) 6.25 mg (01/03/21 0829)    PRN Medications: sodium chloride, dextrose, metoprolol tartrate, ondansetron (ZOFRAN) IV, oxyCODONE, promethazine (PHENERGAN) injection (IM or IVPB), sodium chloride flush, sodium chloride flush, traMADol   Assessment/Plan:   1. Valvular heart disease s/p MVR/TVR  on 5/12 - Severe TRs/p annuloplasty. Severe MRs/p annuloplasty on 12/30/20 - Echo 01/02/21 LVEF 55% RV moderately HK. Moderate TR. No significant MR. No effusion.  2. Acute on chronic systolic HF with R>>L HF - TEE 4/22 EF 45-50% RV moderately HK  - Echo LVEF 55% RV moderately HK. Moderate TR. No significant MR. No effusion. - co-ox 60% on milrinone 0.25. Will complete diuresis today and begin milrinone wean tomorrow  - Volume status improving. Still about 5 pounds up from baseline. Renal managing diuretics. Creatinine improving - C/w Unna boots    3. AKI on CKD 3b post-op  - baseline SCr 1.6-1.8 - Scr 4.1 ->->->  2.7  today. Likely post-op ATN - Plan as above - Keep MAP >= 70 - diuretics per nephrology   4. Longstanding Persistent Atrial Fibrillation  s/p Maze - CHADSVASC 4+  - ASA 324 per TCTS -off digoxin with AKI  - Remains in nSR  - continue low dose amiodarone 200 mg PO Daily - AC per TCTS  5. Morbid Obesity - needs weight loss  6. DM2 - SSI   7. Hyponatremia - sodium improved to 135 today  8. Hypokalemia - K 3.3 - supp w/ KCl   Continue to ambulate. Per CT surgery, to transfer to PCU today.    Length of Stay: 15   Glori Bickers MD  01/05/2021, 8:27 AM  Advanced Heart Failure Team Pager [phone] (M-F; 7a - 4p)  Please contact [facility] for night-coverage after hours (4p -7a ) and weekends on [email or web address]

## 2021-01-05 NOTE — Progress Notes (Signed)
Patient current CVP reading 10-11, will monitor. Laurelle Skiver, Bettina Gavia RN

## 2021-01-05 NOTE — Progress Notes (Signed)
Mobility Specialist - Progress Note   01/05/21 1812  Mobility  Activity Ambulated in hall  Level of Assistance Minimal assist, patient does 75% or more  Assistive Device Four wheel walker (EVA)  Distance Ambulated (ft) 120 ft  Mobility Ambulated with assistance in hallway  Mobility Response Tolerated well  Mobility performed by Mobility specialist  $Mobility charge 1 Mobility   Pt asx throughout ambulation on RA. She was min assist to power up from the chair. Pt to recliner after walk, call bell at side. VSS throughout.  Pricilla Handler Mobility Specialist Mobility Specialist Phone: [phone]

## 2021-01-05 NOTE — Progress Notes (Signed)
      [address]       [place] [address]             [phone]      7 Days Post-Op Procedure(s) (LRB): MITRAL VALVE REPAIR USING CARBOMEDICS ANNULOFLEX RING SIZE 30 (N/A) 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 RING SIZE 34 (N/A) MAZE (N/A) TRANSESOPHAGEAL ECHOCARDIOGRAM (TEE) (N/A) Subjective: Feels okay this morning, she knows it will be a slow recovery   Objective: Vital signs in last 24 hours: Temp:  [97.8 F (36.6 C)-98.5 F (36.9 C)] 98.5 F (36.9 C) (05/18 0436) Pulse Rate:  [88-101] 92 (05/18 0436) Cardiac Rhythm: Heart block (05/18 0736) Resp:  [14-21] 18 (05/18 0436) BP: (98-129)/(43-77) 105/51 (05/18 0436) SpO2:  [91 %-98 %] 97 % (05/18 0436) Weight:  [128.3 kg-130.8 kg] 128.3 kg (05/18 0436)     Intake/Output from previous day: 05/17 0701 - 05/18 0700 In: 1026.8 [P.O.:480; I.V.:195.1; IV Piggyback:351.7] Out: 4975 [Urine:4975] Intake/Output this shift: Total I/O In: 120 [P.O.:120] Out: -   General appearance: alert, cooperative and no distress Heart: regular rate and rhythm, S1, S2 normal, no murmur, click, rub or gallop Lungs: clear to auscultation bilaterally Abdomen: soft, non-tender; bowel sounds normal; no masses,  no organomegaly Extremities: 2-3+ nonpitting edema in lower ext Wound: clean and dry sternal incision  Lab Results: Recent Labs    01/04/21 0339 01/05/21 0504  WBC 10.1 10.1  HGB 7.7* 7.7*  HCT 23.8* 23.6*  PLT 200 226   BMET:  Recent Labs    01/04/21 1452 01/05/21 0504  NA 132* 135  K 3.5 3.3*  CL 91* 93*  CO2 30 34*  GLUCOSE 174* 139*  BUN 68* 64*  CREATININE 3.09* 2.73*  CALCIUM 8.9 9.2    PT/INR: No results for input(s): LABPROT, INR in the last 72 hours. ABG    Component Value Date/Time   PHART 7.368 12/30/2020 0050   HCO3 23.0 12/30/2020 0050   TCO2 24 12/30/2020 0050   ACIDBASEDEF 2.0 12/30/2020 0050   O2SAT 60.3 01/05/2021 0522   CBG (last 3)  Recent Labs     01/04/21 1619 01/04/21 2122 01/05/21 0603  GLUCAP 141* 197* 175*    Assessment/Plan: S/P Procedure(s) (LRB): MITRAL VALVE REPAIR USING CARBOMEDICS ANNULOFLEX RING SIZE 30 (N/A) 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 RING SIZE 34 (N/A) MAZE (N/A) TRANSESOPHAGEAL ECHOCARDIOGRAM (TEE) (N/A)  1. CV-NSR in the 90s, BP low at times. Continue Amio, asa81, Eliquis, and statin. On milrinone, coox 60% continue lasix, appreciate HF input.  2. Pulm-CXR shows no pneumothorax or pleural effusion. Continue to encourage incentive spirometer.  3. Renal-Weight trending down. Creatinine 2.73, trending down. Hypokalemia, potassium replacement orders placed already 4. H and H - 7.7/23.6, expected acute blood loss anemia 5. Endo-blood glucose with moderate control  Plan: Keeping foley in for accurate Is and Os. She is doing well, making slow and steady progress. Continue to encourage ambulation and use of incentive spirometer.     LOS: 15 days    Elgie Collard 01/05/2021

## 2021-01-05 NOTE — TOC Initial Note (Addendum)
Transition of Care (TOC) - Initial/Assessment Note  Heart Failure   Patient Details  Name: [name] MRN: [id number] Date of Birth: 30-Sep-1955  Transition of Care [facility]) CM/SW Contact:    [place], [place] Phone Number: 01/05/2021, 11:57 AM  Clinical Narrative:                 CSW spoke with the patient at bedside and completed very brief SDOH screening with the patient who reported having some needs at this time as her finances are tight. Patient reported that she does get food stamps and she lives with her daughter and adult grandchildren and has a good support system. Patient reported they do have a PCP and they can get to the pharmacy to pick up their medications. CSW provided the patient with a heart failure booklet and briefly spoke about following up with the [facility] outpatient clinic upon discharge. CSW provided the patient with the social workers name, number and position and if they identify other needs to please reach out so that CSW can provide support.    Barriers to Discharge: Continued Medical Work up   Patient Goals and CMS Choice        Expected Discharge Plan and Services   In-house Referral: Clinical Social Work     Living arrangements for the past 2 months: Apartment                                      Prior Living Arrangements/Services Living arrangements for the past 2 months: Apartment Lives with:: [place] Patient language and need for interpreter reviewed:: Yes Do you feel safe going back to the place where you live?: Yes      Need for Family Participation in Patient Care: No (Comment) Care giver support system in place?: No (comment)   Criminal Activity/Legal Involvement Pertinent to Current Situation/Hospitalization: No - Comment as needed  Activities of Daily Living Home Assistive Devices/Equipment: Bedside commode/3-in-1 ADL Screening (condition at time of admission) Patient's cognitive ability adequate to safely  complete daily activities?: Yes Is the patient deaf or have difficulty hearing?: No Does the patient have difficulty seeing, even when wearing glasses/contacts?: No Does the patient have difficulty concentrating, remembering, or making decisions?: No Patient able to express need for assistance with ADLs?: Yes Does the patient have difficulty dressing or bathing?: No Independently performs ADLs?: Yes (appropriate for developmental age) Does the patient have difficulty walking or climbing stairs?: No Weakness of Legs: None Weakness of Arms/Hands: None  Permission Sought/Granted                  Emotional Assessment Appearance:: Appears stated age Attitude/Demeanor/Rapport: Engaged Affect (typically observed): Pleasant Orientation: : Oriented to Self,Oriented to Place,Oriented to  Time,Oriented to Situation   Psych Involvement: No (comment)  Admission diagnosis:  CHF exacerbation ([place]) [I50.9] Atrial fibrillation, permanent ([place]) [I48.21] Acute on chronic heart failure, unspecified heart failure type ([place]) [I50.9] Chest pain, unspecified type [R07.9] Nausea and vomiting, intractability of vomiting not specified, unspecified vomiting type [R11.2] S/P mitral valve repair [G86.761] Patient Active Problem List   Diagnosis Date Noted  . 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  . Encounter for preoperative dental examination   . Teeth missing   . Gingivitis   . Accretions on teeth   . CHF exacerbation ([place]) 12/21/2020  . Acute on chronic heart failure ([place]) 12/19/2020  . Atrial fibrillation, permanent ([place]) 11/02/2020  .  Chronic combined systolic and diastolic heart failure ([place]) 05/06/2020  . NICM (nonischemic cardiomyopathy) ([place]) 04/01/2020  . Family history of heart disease 04/01/2020  . Mitral regurgitation 04/01/2020  . Severe tricuspid regurgitation 04/01/2020  . Closed right ankle fracture 09/24/2019  . DKA, type 2 ([place]) 09/24/2019  . HHNC (hyperglycemic hyperosmolar  nonketotic coma) ([place]) 02/21/2019  . Hypokalemia 02/21/2019  . Acute kidney injury superimposed on CKD ([place]) 02/21/2019  . Acute on chronic left systolic heart failure ([place]) 07/01/2018  . Congestive heart failure with left ventricular diastolic dysfunction, acute ([place]) 07/01/2018  . Right upper lobe pneumonia 05/14/2018  . Atrial fibrillation with RVR ([place]) 05/14/2018  . Chronic atrial fibrillation ([place]) 12/26/2017  . Diastolic dysfunction 01/65/5374  . Diabetes mellitus type 2 in obese ([place]) 12/26/2017  . Nausea vomiting and diarrhea   . Chest pain 02/26/2012  . OSA (obstructive sleep apnea) 09/21/2011  . Hypoxia 09/21/2011  . Diabetes mellitus ([place]) 02/04/2010  . Morbid obesity ([place]) 02/04/2010  . Atrial fibrillation ([place]) 02/04/2010  . Gastroparesis 02/04/2010  . Acute gastroenteritis 02/04/2010   PCP:  [name], NP Pharmacy:   [id number] [place], [address] [place] [address] Phone: [phone] Fax: [phone]  [facility] [address] [place] [address] Phone: [phone] Fax: [phone]     Social Determinants of Health (SDOH) Interventions Food Insecurity Interventions: Other (Comment) (patient has food stamps) Financial Strain Interventions: Intervention Not Indicated Housing Interventions: Intervention Not Indicated Transportation Interventions: [facility]  Readmission Risk Interventions Readmission Risk Prevention Plan 03/08/2020  Transportation Screening Complete  Medication Review Press photographer) Complete  PCP or Specialist appointment within 3-5 days of discharge Complete  HRI or [place] Complete  SW Recovery Care/Counseling Consult Complete  [place] Not Applicable  Some recent data might be hidden   Nicanor [place]olia, MSW, LCSWA [phone] Heart Failure Social  Worker

## 2021-01-05 NOTE — Progress Notes (Signed)
Nephrology Follow-Up Consult note   Assessment/Recommendations: [name] is a/an 65 y.o. female with a past medical history significant for OSA, HTN, DVT, DM2, COPD, admitted for CHF exacerbation with mitral and tricuspid valvular disease status post repair.       Nonoliguric AKI/cardiorenal syndrome/ATN: Likely secondary to ATN associated with surgery and contribution of cardiorenal syndrome with volume overload.  Continues to significantly improved today -Decrease Lasix to 120 mg twice daily -Continue to monitor weight and creatinine trend -Unlikely to need dialysis -HF team and surgery following; appreciate help -Continue to monitor daily Cr, Dose meds for GFR -Monitor Daily I/Os, Daily weight  -Maintain MAP>65 for optimal renal perfusion.  -Avoid nephrotoxic medications including NSAIDs and Vanc/Zosyn combo -If she continues to improve we may sign off tomorrow  CHF exacerbation c/b valvular disease s/p repair: on 5/12. On milrinone. Appreciate HF and CT surg help Atrial fibrillation: s/p Maze. On amiodarone Morbid obesity: long term issue DM2: mgmt per primary Hyponatremia: Resolved with volume removal Hypokalemia: Tinea with IV and oral repletion Anemia: Hemoglobin 7.8.  Multifactorial.  Continue to monitor.  No ESA   Recommendations conveyed to primary service.    [facility] 01/05/2021 10:07 AM  ___________________________________________________________  CC: AKI on CKD  Interval History/Subjective: Patient continues to have excellent urine output.  Creatinine continues to improve down to 2.7 today.  Having some chest discomfort this morning.   Medications:  Current Facility-Administered Medications  Medication Dose Route Frequency Provider Last Rate Last Admin  . 0.9 %  sodium chloride infusion  250 mL Intravenous PRN [name], MD      . amiodarone (PACERONE) tablet 200 mg  200 mg Oral Daily [name], MD   200 mg at 01/05/21 2025  . apixaban (ELIQUIS) tablet 5 mg  5 mg Oral BID [name], MD   5 mg at 01/05/21 4270  . aspirin EC tablet 81 mg  81 mg Oral Daily [name], MD   81 mg at 01/05/21 6237  . bisacodyl (DULCOLAX) EC tablet 10 mg  10 mg Oral Daily [name], MD   10 mg at 01/05/21 6283   Or  . bisacodyl (DULCOLAX) suppository 10 mg  10 mg Rectal Daily [name], MD      . Chlorhexidine Gluconate Cloth 2 % PADS 6 each  6 each Topical Daily [name], MD   6 each at 01/03/21 1035  . [place] [facility], Patient & Family Education   Does not apply Once [name], MD      . dextrose 50 % solution 0-50 mL  0-50 mL Intravenous PRN [name], MD      . docusate sodium (COLACE) capsule 200 mg  200 mg Oral Daily [name], MD   200 mg at 01/03/21 1038  . DULoxetine (CYMBALTA) DR capsule 60 mg  60 mg Oral Daily [name], MD   60 mg at 01/05/21 1517  . furosemide (LASIX) 120 mg in dextrose 5 % 50 mL IVPB  120 mg Intravenous BID Reesa Chew, MD      . insulin aspart (novoLOG) injection 0-20 Units  0-20 Units Subcutaneous TID WC [name], MD   4 Units at 01/05/21 [phone]  . insulin aspart (novoLOG) injection 0-5 Units  0-5 Units Subcutaneous QHS [name], MD      . insulin aspart (novoLOG) injection 3 Units  3 Units  Subcutaneous TID WC [name], MD   3 Units at 01/05/21 0801  . insulin detemir (LEVEMIR) injection 30 Units  30 Units Subcutaneous BID [name], MD   30 Units at 01/05/21 0913  . lactulose (CHRONULAC) 10 GM/15ML solution 20 g  20 g Oral Daily [name], MD   20 g at 01/05/21 0908  . MEDLINE mouth rinse  15 mL Mouth Rinse BID [name], MD   15 mL at 01/05/21 0907  . metoprolol tartrate (LOPRESSOR) injection 2.5-5 mg  2.5-5 mg Intravenous Q2H PRN [name], MD      .  milrinone (PRIMACOR) 20 MG/100 ML (0.2 mg/mL) infusion  0.25 mcg/kg/min Intravenous Continuous [name], MD 9.74 mL/hr at 01/05/21 0340 0.25 mcg/kg/min at 01/05/21 0340  . ondansetron (ZOFRAN) injection 4 mg  4 mg Intravenous Q6H PRN [name], MD   4 mg at 01/03/21 1329  . oxyCODONE (Oxy IR/ROXICODONE) immediate release tablet 5-10 mg  5-10 mg Oral Q3H PRN [name], MD   10 mg at 01/05/21 0906  . pantoprazole (PROTONIX) EC tablet 40 mg  40 mg Oral Daily [name], MD   40 mg at 01/05/21 6759  . potassium chloride 10 mEq in 50 mL *CENTRAL LINE* IVPB  10 mEq Intravenous Q1 Hr x 3 Reesa Chew, MD 50 mL/hr at 01/05/21 0906 10 mEq at 01/05/21 0906  . potassium chloride SA (KLOR-CON) CR tablet 40 mEq  40 mEq Oral BID Reesa Chew, MD   40 mEq at 01/05/21 0906  . promethazine (PHENERGAN) 6.25 mg in sodium chloride 0.9 % 50 mL IVPB  6.25 mg Intravenous Q6H PRN [name], MD 200 mL/hr at 01/03/21 0829 6.25 mg at 01/03/21 0829  . rosuvastatin (CRESTOR) tablet 10 mg  10 mg Oral Daily [name], MD   10 mg at 01/05/21 0906  . sodium chloride flush (NS) 0.9 % injection 10-40 mL  10-40 mL Intracatheter PRN [name], MD      . sodium chloride flush (NS) 0.9 % injection 3 mL  3 mL Intravenous Q12H [name], MD   3 mL at 01/05/21 0914  . sodium chloride flush (NS) 0.9 % injection 3 mL  3 mL Intravenous PRN [name], MD      . traMADol Veatrice Bourbon) tablet 50-100 mg  50-100 mg Oral Q4H PRN [name], MD   100 mg at 01/03/21 1638      Review of Systems: 10 systems reviewed and negative except per interval history/subjective  Physical Exam: Vitals:   01/05/21 0012 01/05/21 0436  BP: (!) 102/51 (!) 105/51  Pulse: 88 92  Resp: 18 18  Temp: 98.4 F (36.9 C) 98.5 F (36.9 C)  SpO2: 97% 97%   Total I/O In: 120 [P.O.:120] Out: 800 [Urine:800]  Intake/Output Summary (Last 24  hours) at 01/05/2021 1007 Last data filed at 01/05/2021 4665 Gross per 24 hour  Intake 1030.99 ml  Output 5250 ml  Net -4219.01 ml   Constitutional: Sitting in chair, no distress ENMT: ears and nose without scars or lesions, MMM CV: normal rate Respiratory: Bilateral chest rise with no increased work of breathing Gastrointestinal: soft, non-tender, no palpable masses or hernias Skin: no visible lesions or rashes Psych: alert, judgement/insight appropriate, appropriate mood and affect   Test Results I personally reviewed new and old clinical labs and radiology tests Lab Results  Component Value Date  NA 135 01/05/2021   K 3.3 (L) 01/05/2021   CL 93 (L) 01/05/2021   CO2 34 (H) 01/05/2021   BUN 64 (H) 01/05/2021   CREATININE 2.73 (H) 01/05/2021   CALCIUM 9.2 01/05/2021   ALBUMIN 2.8 (L) 01/05/2021   PHOS 3.2 01/05/2021

## 2021-01-06 DIAGNOSIS — I5033 Acute on chronic diastolic (congestive) heart failure: Secondary | ICD-10-CM | POA: Diagnosis not present

## 2021-01-06 DIAGNOSIS — I4821 Permanent atrial fibrillation: Secondary | ICD-10-CM | POA: Diagnosis not present

## 2021-01-06 LAB — RENAL FUNCTION PANEL
Albumin: 2.8 g/dL — ABNORMAL LOW (ref 3.5–5.0)
Anion gap: 8 (ref 5–15)
BUN: 62 mg/dL — ABNORMAL HIGH (ref 8–23)
CO2: 34 mmol/L — ABNORMAL HIGH (ref 22–32)
Calcium: 9.2 mg/dL (ref 8.9–10.3)
Chloride: 92 mmol/L — ABNORMAL LOW (ref 98–111)
Creatinine, Ser: 2.36 mg/dL — ABNORMAL HIGH (ref 0.44–1.00)
GFR, Estimated: 22 mL/min — ABNORMAL LOW (ref >60)
Glucose, Bld: 146 mg/dL — ABNORMAL HIGH (ref 70–99)
Phosphorus: 3.6 mg/dL (ref 2.5–4.6)
Potassium: 3.6 mmol/L (ref 3.5–5.1)
Sodium: 134 mmol/L — ABNORMAL LOW (ref 135–145)

## 2021-01-06 LAB — COOXEMETRY PANEL
Carboxyhemoglobin: 1.8 % — ABNORMAL HIGH (ref 0.5–1.5)
Methemoglobin: 1 % (ref 0.0–1.5)
O2 Saturation: 67 %
Total hemoglobin: 8.2 g/dL — ABNORMAL LOW (ref 12.0–16.0)

## 2021-01-06 LAB — GLUCOSE, CAPILLARY
Glucose-Capillary: 152 mg/dL — ABNORMAL HIGH (ref 70–99)
Glucose-Capillary: 174 mg/dL — ABNORMAL HIGH (ref 70–99)
Glucose-Capillary: 135 mg/dL — ABNORMAL HIGH (ref 70–99)
Glucose-Capillary: 149 mg/dL — ABNORMAL HIGH (ref 70–99)

## 2021-01-06 MED ORDER — AMIODARONE HCL IN DEXTROSE 360-4.14 MG/200ML-% IV SOLN
30.0000 mg/h | INTRAVENOUS | Status: DC
  Administered 2021-01-07: 30 mg/h via INTRAVENOUS
  Filled 2021-01-06: qty 200

## 2021-01-06 MED ORDER — OXYCODONE HCL 5 MG PO TABS
5.0000 mg | ORAL_TABLET | ORAL | Status: DC | PRN
  Administered 2021-01-06 – 2021-01-08 (×9): 10 mg via ORAL
  Administered 2021-01-08: 5 mg via ORAL
  Administered 2021-01-09 – 2021-01-12 (×9): 10 mg via ORAL
  Filled 2021-01-06: qty 1
  Filled 2021-01-06 (×18): qty 2

## 2021-01-06 MED ORDER — AMIODARONE LOAD VIA INFUSION
150.0000 mg | Freq: Once | INTRAVENOUS | Status: AC
  Administered 2021-01-06: 150 mg via INTRAVENOUS
  Filled 2021-01-06: qty 83.34

## 2021-01-06 MED ORDER — POTASSIUM CHLORIDE CRYS ER 20 MEQ PO TBCR
40.0000 meq | EXTENDED_RELEASE_TABLET | Freq: Every day | ORAL | Status: DC
  Administered 2021-01-06 – 2021-01-09 (×4): 40 meq via ORAL
  Filled 2021-01-06 (×4): qty 2

## 2021-01-06 MED ORDER — AMIODARONE HCL IN DEXTROSE 360-4.14 MG/200ML-% IV SOLN
60.0000 mg/h | INTRAVENOUS | Status: DC
  Administered 2021-01-06 (×2): 60 mg/h via INTRAVENOUS
  Filled 2021-01-06: qty 200

## 2021-01-06 MED ORDER — TORSEMIDE 20 MG PO TABS
60.0000 mg | ORAL_TABLET | Freq: Every day | ORAL | Status: DC
  Administered 2021-01-06 – 2021-01-12 (×7): 60 mg via ORAL
  Filled 2021-01-06 (×7): qty 3

## 2021-01-06 NOTE — Telephone Encounter (Signed)
Shelton Silvas with the diabetes store called saying they need a diagnosis or the diabetic shoes.  She said question # 3 needs a code for a secondary diagonsis code.  She will refax the order  CB#  [phone]

## 2021-01-06 NOTE — Progress Notes (Signed)
Physical Therapy Treatment Patient Details Name: [name] MRN: [id number] DOB: 13-Jul-1956 Today's Date: 01/06/2021    History of Present Illness Patient is a 65 y/o female admitted on 12/19/20 with SOB, chest pain, N/V and emesis. Admitted with CHF exacerbation. s/p MVR,TVR and Maze 12/29/20. PMHx: HTN. DM2, A-fib, CHF, OSA, DVT, COPD.    PT Comments    Pt received in chair, agreeable to therapy session and with good participation and tolerance for household distance gait task with RW. Had +2 assist for safety due to pt previous episodes of buckling but no overt buckling this date, although pt self-limited gait distance due to fatigue. Pt able to transfer with good compliance with sternal precautions and min guard to minA. Pt requesting OT bring squeeze ball for grip strengthening next session. Pt continues to benefit from PT services to progress toward functional mobility goals. Continue to recommend HHPT, will plan to progress gait distance with bariatric RW next session (RW ordered to room by unit secretary).  Follow Up Recommendations  Home health PT;Supervision - Intermittent     Equipment Recommendations  None recommended by PT    Recommendations for Other Services       Precautions / Restrictions Precautions Precautions: Fall Precaution Comments: sternal precautions, knees buckle, watch HR Restrictions Weight Bearing Restrictions: No Other Position/Activity Restrictions: sternal precautions    Mobility  Bed Mobility Overal bed mobility: Needs Assistance Bed Mobility: Rolling;Sit to Sidelying Rolling: Min guard       Sit to sidelying: Mod assist General bed mobility comments: continuing to work on technique, adhering to sternal precautions, modA for BLE elevation over EOB    Transfers Overall transfer level: Needs assistance Equipment used: Rolling walker (2 wheeled) Transfers: Sit to/from Stand Sit to Stand: Min guard;Min assist         General  transfer comment: +2 min guard from chair, +1 minA from [facility] to RW and to sit at EOB  Ambulation/Gait Ambulation/Gait assistance: Min guard Gait Distance (Feet): 25 Feet (x2 with seated break) Assistive device: Rolling walker (2 wheeled) Gait Pattern/deviations: Step-through pattern;Decreased stride length;Wide base of support Gait velocity: decreased Gait velocity interpretation: <1.8 ft/sec, indicate of risk for recurrent falls General Gait Details: slow, steady waddling gait to/from toilet in bathroom, pt encouraged to try to progress gait distance with chair follow but too fatigued after toileting and washing hands at sink to walk further.         Balance Overall balance assessment: History of Falls;Needs assistance Sitting-balance support: Feet supported;No upper extremity supported Sitting balance-Leahy Scale: Good     Standing balance support: During functional activity;Bilateral upper extremity supported;No upper extremity supported Standing balance-Leahy Scale: Poor Standing balance comment: needs BUE support of RW for dynamic tasks and at least U UE for static tasks                            Cognition Arousal/Alertness: Awake/alert Behavior During Therapy: WFL for tasks assessed/performed Overall Cognitive Status: Impaired/Different from baseline Area of Impairment: Problem solving                             Problem Solving: Requires verbal cues General Comments: good 1 and 2-step command following and A&O x4; quick to fatigue but participatory as able.      Exercises      General Comments General comments (skin integrity, edema, etc.): VSS on RA; pt needs  assist for peri-care after toileting/BM      Pertinent Vitals/Pain Pain Assessment: Faces Faces Pain Scale: Hurts little more Pain Location: chest incision Pain Descriptors / Indicators: Discomfort Pain Intervention(s): Monitored during session;Repositioned;Premedicated before  session           PT Goals (current goals can now be found in the care plan section) Acute Rehab PT Goals Patient Stated Goal: return to PLOF Progress towards PT goals: Progressing toward goals    Frequency    Min 3X/week      PT Plan Current plan remains appropriate       AM-PAC PT "6 Clicks" Mobility   Outcome Measure  Help needed turning from your back to your side while in a flat bed without using bedrails?: None Help needed moving from lying on your back to sitting on the side of a flat bed without using bedrails?: A Lot Help needed moving to and from a bed to a chair (including a wheelchair)?: A Little Help needed standing up from a chair using your arms (e.g., wheelchair or bedside chair)?: A Little Help needed to walk in hospital room?: A Little Help needed climbing 3-5 steps with a railing? : A Lot 6 Click Score: 17    End of Session Equipment Utilized During Treatment: Gait belt Activity Tolerance: Patient tolerated treatment well Patient left: in bed;with call bell/phone within reach;with bed alarm set;Other (comment) (heels floated) Nurse Communication: Mobility status PT Visit Diagnosis: Pain;Muscle weakness (generalized) (M62.81);Unsteadiness on feet (R26.81);Difficulty in walking, not elsewhere classified (R26.2)     Time: 1771-1657 PT Time Calculation (min) (ACUTE ONLY): 26 min  Charges:  $Gait Training: 8-22 mins $Therapeutic Activity: 8-22 mins                     Lenita Peregrina P., PTA Acute Rehabilitation Services Pager: [phone] Office: [place] 01/06/2021, 5:24 PM

## 2021-01-06 NOTE — Progress Notes (Signed)
Nephrology Follow-Up Consult note   Assessment/Recommendations: [name] is a/an 65 y.o. female with a past medical history significant for OSA, HTN, DVT, DM2, COPD, admitted for CHF exacerbation with mitral and tricuspid valvular disease status post repair.       Nonoliguric AKI/cardiorenal syndrome/ATN: Likely secondary to ATN associated with surgery and contribution of cardiorenal syndrome with volume overload.  Creatinine continues to improve steadily with a baseline creatinine of 1.5-1.8. -Continue IV Lasix 120 mg twice daily -Can switch to oral therapy in the next few days as her weight improves -Because of her significant improvement in kidney function I think we can sign off at this time and defer further diuretic management to her heart failure team; but we are available if needed and further questions arise. -Continue to monitor weight and creatinine trend -Continue to monitor daily Cr, Dose meds for GFR -Monitor Daily I/Os, Daily weight  -Maintain MAP>65 for optimal renal perfusion.  -Avoid nephrotoxic medications including NSAIDs and Vanc/Zosyn combo  CHF exacerbation c/b valvular disease s/p repair: on 5/12. Appreciate HF and CT surg help Atrial fibrillation: s/p Maze. On amiodarone Morbid obesity: long term issue DM2: mgmt per primary Anemia: Hemoglobin 7.8.  Multifactorial.  Continue to monitor.  No ESA   Recommendations conveyed to primary service.    [facility] 01/06/2021 10:31 AM  ___________________________________________________________  CC: AKI on CKD  Interval History/Subjective: Patient states her chest is more comfortable today.  Does feel tired.  Urinating with no problems.  Creatinine continues to improve.  Weight significantly improved   Medications:  Current Facility-Administered Medications  Medication Dose Route Frequency Provider Last Rate Last Admin  . 0.9 %  sodium chloride infusion  250 mL  Intravenous PRN [name], MD      . amiodarone (PACERONE) tablet 200 mg  200 mg Oral Daily [name], MD   200 mg at 01/05/21 0737  . apixaban (ELIQUIS) tablet 5 mg  5 mg Oral BID [name], MD   5 mg at 01/05/21 2123  . bisacodyl (DULCOLAX) EC tablet 10 mg  10 mg Oral Daily [name], MD   10 mg at 01/05/21 1062   Or  . bisacodyl (DULCOLAX) suppository 10 mg  10 mg Rectal Daily [name], MD      . Chlorhexidine Gluconate Cloth 2 % PADS 6 each  6 each Topical Daily [name], MD   6 each at 01/05/21 1026  . [facility], Patient & Family Education   Does not apply Once [name], MD      . dextrose 50 % solution 0-50 mL  0-50 mL Intravenous PRN [name], MD      . docusate sodium (COLACE) capsule 200 mg  200 mg Oral Daily [name], MD   200 mg at 01/03/21 1038  . DULoxetine (CYMBALTA) DR capsule 60 mg  60 mg Oral Daily [name], MD   60 mg at 01/05/21 6948  . insulin aspart (novoLOG) injection 0-20 Units  0-20 Units Subcutaneous TID WC [name], MD   4 Units at 01/06/21 0601  . insulin aspart (novoLOG) injection 0-5 Units  0-5 Units Subcutaneous QHS [name], MD      . insulin aspart (novoLOG) injection 3 Units  3 Units Subcutaneous TID WC [name], MD   3 Units at 01/05/21 1810  . insulin detemir (LEVEMIR) injection 30 Units  30  Units Subcutaneous BID [name], MD   30 Units at 01/05/21 2258  . lactulose (CHRONULAC) 10 GM/15ML solution 20 g  20 g Oral Daily [name], MD   20 g at 01/05/21 0908  . MEDLINE mouth rinse  15 mL Mouth Rinse BID [name], MD   15 mL at 01/05/21 2125  . metoprolol tartrate (LOPRESSOR) injection 2.5-5 mg  2.5-5 mg Intravenous Q2H PRN [name], MD      . milrinone (PRIMACOR) 20 MG/100 ML (0.2 mg/mL) infusion  0.125 mcg/kg/min Intravenous  Continuous Clegg, Amy D, NP 4.87 mL/hr at 01/06/21 0815 0.125 mcg/kg/min at 01/06/21 0815  . ondansetron (ZOFRAN) injection 4 mg  4 mg Intravenous Q6H PRN [name], MD   4 mg at 01/03/21 1329  . oxyCODONE (Oxy IR/ROXICODONE) immediate release tablet 5-10 mg  5-10 mg Oral Q4H PRN [name], MD      . pantoprazole (PROTONIX) EC tablet 40 mg  40 mg Oral Daily [name], MD   40 mg at 01/05/21 0907  . potassium chloride SA (KLOR-CON) CR tablet 40 mEq  40 mEq Oral Daily Clegg, Amy D, NP      . promethazine (PHENERGAN) 6.25 mg in sodium chloride 0.9 % 50 mL IVPB  6.25 mg Intravenous Q6H PRN [name], MD 200 mL/hr at 01/03/21 0829 6.25 mg at 01/03/21 0829  . rosuvastatin (CRESTOR) tablet 10 mg  10 mg Oral Daily [name], MD   10 mg at 01/05/21 0906  . sodium chloride flush (NS) 0.9 % injection 10-40 mL  10-40 mL Intracatheter PRN [name], MD      . sodium chloride flush (NS) 0.9 % injection 3 mL  3 mL Intravenous Q12H [name], MD   3 mL at 01/05/21 2125  . sodium chloride flush (NS) 0.9 % injection 3 mL  3 mL Intravenous PRN [name], MD      . torsemide (DEMADEX) tablet 60 mg  60 mg Oral Daily Clegg, Amy D, NP          Review of Systems: 10 systems reviewed and negative except per interval history/subjective  Physical Exam: Vitals:   01/05/21 2355 01/06/21 0731  BP: 118/64 (!) 116/56  Pulse: 85 63  Resp: 12 19  Temp: 97.6 F (36.4 C) 97.6 F (36.4 C)  SpO2: 97% 96%   Total I/O In: -  Out: 1250 [Urine:1250]  Intake/Output Summary (Last 24 hours) at 01/06/2021 1031 Last data filed at 01/06/2021 0731 Gross per 24 hour  Intake 597.41 ml  Output 2725 ml  Net -2127.59 ml   Constitutional: Sitting in chair, no distress ENMT: ears and nose without scars or lesions, MMM CV: normal rate Respiratory: Bilateral chest rise with no increased work of breathing Gastrointestinal: soft,  non-tender, no palpable masses or hernias Skin: no visible lesions or rashes Psych: alert, judgement/insight appropriate, appropriate mood and affect   Test Results I personally reviewed new and old clinical labs and radiology tests Lab Results  Component Value Date   NA 134 (L) 01/06/2021   K 3.6 01/06/2021   CL 92 (L) 01/06/2021   CO2 34 (H) 01/06/2021   BUN 62 (H) 01/06/2021   CREATININE 2.36 (H) 01/06/2021   CALCIUM 9.2 01/06/2021   ALBUMIN 2.8 (L) 01/06/2021   PHOS 3.6 01/06/2021

## 2021-01-06 NOTE — Progress Notes (Signed)
CARDIAC REHAB PHASE I   PRE:  Rate/Rhythm: 79 afib  BP:  Supine: 139/96  Sitting:   Standing:    SaO2: 97%RA                 MODE:  Ambulation: 120 ft Sat at 16ft  POST:  Rate/Rhythm: 94 afib   Hall BP 128/54  BP:  Supine:   Sitting: 135/71  Standing:    SaO2: 100%RA 1045-1120 Pt walked 120 ft on RA with EVA ,.gait belt and asst x 2. Followed with rollator and pt had to sit due to leg weakness. BP at that time 128/54. C/o feeling a little lightheaded. Pt sat a few minutes and then walked back to room. Pt assisted to recliner with call bell. Encouraged two more walks today. Stands easily but needs assistance to get to sitting position from lying.    Graylon Good, RN BSN  01/06/2021 11:15 AM

## 2021-01-06 NOTE — Progress Notes (Signed)
Mobility Specialist: Progress Note   01/06/21 1613  Mobility  Activity Ambulated to bathroom  Level of Assistance Minimal assist, patient does 75% or more  Assistive Device Front wheel walker  Distance Ambulated (ft) 50 ft  Mobility Ambulated with assistance in room  Mobility Response Tolerated well  Mobility performed by Mobility specialist  $Mobility charge 1 Mobility   Pt agreeable to ambulate with assistance from myself and PTA Carly. Pt was minA to stand and standby assist to BR. Pt differed further mobility due to fatigue. Pt ambulated back to bed, call bell at her side. Pt was minA to get her legs back into bed and for positioning.   [facility] Aneesa Romey Mobility Specialist Mobility Specialist Phone: [phone]

## 2021-01-06 NOTE — Progress Notes (Addendum)
      [id number]       [place] [id number]             [phone]      8 Days Post-Op Procedure(s) (LRB): MITRAL VALVE REPAIR USING CARBOMEDICS ANNULOFLEX RING SIZE 30 (N/A) 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 RING SIZE 34 (N/A) MAZE (N/A) TRANSESOPHAGEAL ECHOCARDIOGRAM (TEE) (N/A)   Subjective:  Patient complains of chest soreness.  Objective: Vital signs in last 24 hours: Temp:  [97.4 F (36.3 C)-98.6 F (37 C)] 97.6 F (36.4 C) (05/19 0731) Pulse Rate:  [63-93] 63 (05/19 0731) Cardiac Rhythm: Normal sinus rhythm;Heart block (05/18 2208) Resp:  [12-21] 19 (05/19 0731) BP: (101-124)/(56-72) 116/56 (05/19 0731) SpO2:  [93 %-99 %] 96 % (05/19 0731) Weight:  [129 kg-130.9 kg] 129 kg (05/19 0812)  Hemodynamic parameters for last 24 hours: CVP:  [10 mmHg-12 mmHg] 10 mmHg  Intake/Output from previous day: 05/18 0701 - 05/19 0700 In: 717.4 [P.O.:240; I.V.:269.8; IV Piggyback:207.6] Out: 2275 [Urine:2275] Intake/Output this shift: Total I/O In: -  Out: 1250 [Urine:1250]  General appearance: alert, cooperative and no distress Heart: irregularly irregular rhythm Lungs: clear to auscultation bilaterally Abdomen: soft, non-tender; bowel sounds normal; no masses,  no organomegaly Extremities: una boots in place BLE Wound: clean and dry  Lab Results: Recent Labs    01/04/21 0339 01/05/21 0504  WBC 10.1 10.1  HGB 7.7* 7.7*  HCT 23.8* 23.6*  PLT 200 226   BMET:  Recent Labs    01/05/21 0504 01/06/21 0513  NA 135 134*  K 3.3* 3.6  CL 93* 92*  CO2 34* 34*  GLUCOSE 139* 146*  BUN 64* 62*  CREATININE 2.73* 2.36*  CALCIUM 9.2 9.2    PT/INR: No results for input(s): LABPROT, INR in the last 72 hours. ABG    Component Value Date/Time   PHART 7.368 12/30/2020 0050   HCO3 23.0 12/30/2020 0050   TCO2 24 12/30/2020 0050   ACIDBASEDEF 2.0 12/30/2020 0050   O2SAT 60.3 01/05/2021 0522   CBG (last 3)  Recent Labs     01/05/21 1556 01/05/21 2119 01/06/21 0556  GLUCAP 131* 152* 152*    Assessment/Plan: S/P Procedure(s) (LRB): MITRAL VALVE REPAIR USING CARBOMEDICS ANNULOFLEX RING SIZE 30 (N/A) 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 RING SIZE 34 (N/A) MAZE (N/A) TRANSESOPHAGEAL ECHOCARDIOGRAM (TEE) (N/A)  1. CV- rate controlled A. Fib- remains on Milrinone Co-ox pending, on Amiodarone, Eliquis 2. Pulm- no acute issues, continue IS 3. Renal- creatinine at 2.36, K at 3.6 on Demadex 4. Expected post operative blood loss anemia, 7.7 yesterday 5. CBGs- controlled, continue current insulin 6. Dispo- patient stable, back on Milrinone, Coox pending, AHF following.  Creatinine improving, continue current care   LOS: 16 days    Ellwood Handler, PA-C 01/06/2021 Patient seen and examined. She remains on milrinone- dose decreased to 0.125, co-ox pending Pain is improving- dc tramadol, use acetaminophen + oxycodone PRN In rate controlled atrial fib- on Eliquis Continue cardiac rehab Renal function continues to improve  Remo Lipps C. Roxan Hockey, MD [facility] [phone]

## 2021-01-06 NOTE — Progress Notes (Addendum)
Advanced Heart Failure Rounding Note   Subjective:    Remains on milrinone 0.25. CO-OX pending.   SCr continues to improve 3.1 -> 2.7 ->2.4   Echo LVEF 55% RV moderately HK. Moderate TR. No significant MR. No effusion .  Complaining chest soreness.    Objective:   Weight Range:  Vital Signs:   Temp:  [97.4 F (36.3 C)-98.6 F (37 C)] 97.6 F (36.4 C) (05/19 0731) Pulse Rate:  [63-93] 63 (05/19 0731) Resp:  [12-21] 19 (05/19 0731) BP: (101-124)/(56-72) 116/56 (05/19 0731) SpO2:  [93 %-99 %] 96 % (05/19 0731) Weight:  [130.9 kg] 130.9 kg (05/19 0600) Last BM Date: 01/04/21  Weight change: Filed Weights   01/04/21 1837 01/05/21 0436 01/06/21 0600  Weight: 130.8 kg 128.3 kg 130.9 kg    Intake/Output:   Intake/Output Summary (Last 24 hours) at 01/06/2021 0734 Last data filed at 01/06/2021 0600 Gross per 24 hour  Intake 717.41 ml  Output 2275 ml  Net -1557.59 ml    CVP 4 personally checked.  PHYSICAL EXAM: General:  No resp difficulty HEENT: normal Neck: supple. no JVD. Carotids 2+ bilat; no bruits. No lymphadenopathy or thryomegaly appreciated. Cor: PMI nondisplaced. Irregular rate & rhythm. No rubs, gallops or murmurs. Sternal incision approximated.  Lungs: clear Abdomen: soft, nontender, nondistended. No hepatosplenomegaly. No bruits or masses. Good bowel sounds. Extremities: no cyanosis, clubbing, rash, R and LLE unna boots.  Neuro: alert & orientedx3, cranial nerves grossly intact. moves all 4 extremities w/o difficulty. Affect pleasant  Telemetry: A FIb 60-70s  EKG: A fib 73 bpm  Labs: Basic Metabolic Panel: Recent Labs  Lab 12/30/20 1641 12/31/20 0306 01/03/21 0419 01/04/21 0339 01/04/21 1452 01/05/21 0504 01/06/21 0513  NA 133*   < > 129* 131* 132* 135 134*  K 4.3   < > 3.6 3.2* 3.5 3.3* 3.6  CL 100   < > 93* 92* 91* 93* 92*  CO2 25   < > 25 29 30  34* 34*  GLUCOSE 178*   < > 150* 154* 174* 139* 146*  BUN 32*   < > 65* 66* 68* 64* 62*   CREATININE 2.55*   < > 4.28* 3.60* 3.09* 2.73* 2.36*  CALCIUM 9.1   < > 9.1 9.1 8.9 9.2 9.2  MG 3.1*  --   --   --   --   --   --   PHOS  --   --   --   --   --  3.2 3.6   < > = values in this interval not displayed.    Liver Function Tests: Recent Labs  Lab 01/02/21 0433 01/04/21 0339 01/05/21 0504 01/06/21 0513  AST 30 21  --   --   ALT <5 5  --   --   ALKPHOS 93 104  --   --   BILITOT 0.9 0.8  --   --   PROT 7.5 7.1  --   --   ALBUMIN 3.5 3.0* 2.8* 2.8*   No results for input(s): LIPASE, AMYLASE in the last 168 hours. No results for input(s): AMMONIA in the last 168 hours.  CBC: Recent Labs  Lab 01/01/21 0353 01/02/21 0433 01/03/21 0419 01/04/21 0339 01/05/21 0504  WBC 20.2* 17.8* 12.7* 10.1 10.1  HGB 8.2* 8.2* 7.8* 7.7* 7.7*  HCT 26.0* 25.6* 24.4* 23.8* 23.6*  MCV 91.5 89.8 89.1 88.1 88.7  PLT 144* 178 196 200 226    Cardiac Enzymes: No results for  input(s): CKTOTAL, CKMB, CKMBINDEX, TROPONINI in the last 168 hours.  BNP: BNP (last 3 results) Recent Labs    03/02/20 1527 11/22/20 1527 12/19/20 1016  BNP 43.7 132.8* 227.9*    ProBNP (last 3 results) No results for input(s): PROBNP in the last 8760 hours.    Other results:  Imaging: DG Chest Port 1 View  Result Date: 01/05/2021 CLINICAL DATA:  Status post mitral valve repair, shortness of breath. EXAM: PORTABLE CHEST 1 VIEW COMPARISON:  Jan 03, 2021. FINDINGS: Stable cardiomegaly. Right-sided PICC line is unchanged. No pneumothorax or pleural effusion is noted. Lungs are clear. Bony thorax is unremarkable. IMPRESSION: No active disease. Electronically Signed   By: Marijo Conception M.D.   On: 01/05/2021 08:12     Medications:     Scheduled Medications: . amiodarone  200 mg Oral Daily  . apixaban  5 mg Oral BID  . aspirin EC  81 mg Oral Daily  . bisacodyl  10 mg Oral Daily   Or  . bisacodyl  10 mg Rectal Daily  . Chlorhexidine Gluconate Cloth  6 each Topical Daily  . [facility], Patient & Family Education   Does not apply Once  . docusate sodium  200 mg Oral Daily  . DULoxetine  60 mg Oral Daily  . insulin aspart  0-20 Units Subcutaneous TID WC  . insulin aspart  0-5 Units Subcutaneous QHS  . insulin aspart  3 Units Subcutaneous TID WC  . insulin detemir  30 Units Subcutaneous BID  . lactulose  20 g Oral Daily  . mouth rinse  15 mL Mouth Rinse BID  . pantoprazole  40 mg Oral Daily  . potassium chloride  40 mEq Oral BID  . rosuvastatin  10 mg Oral Daily  . sodium chloride flush  3 mL Intravenous Q12H    Infusions: . sodium chloride    . furosemide 120 mg (01/06/21 0547)  . milrinone 0.25 mcg/kg/min (01/06/21 0600)  . promethazine (PHENERGAN) injection (IM or IVPB) 6.25 mg (01/03/21 0829)    PRN Medications: sodium chloride, dextrose, metoprolol tartrate, ondansetron (ZOFRAN) IV, oxyCODONE, promethazine (PHENERGAN) injection (IM or IVPB), sodium chloride flush, sodium chloride flush, traMADol   Assessment/Plan:   1. Valvular heart disease s/p MVR/TVR  on 5/12 - Severe TRs/p annuloplasty. Severe MRs/p annuloplasty on 12/30/20 - Echo 01/02/21 LVEF 55% RV moderately HK. Moderate TR. No significant MR. No effusion.  2. Acute on chronic systolic HF with R>>L HF - TEE 4/22 EF 45-50% RV moderately HK  - Echo LVEF 55% RV moderately HK. Moderate TR. No significant MR. No effusion. - co-ox pending. Cut back milrinone to 0.125 mcg.  - CVP 4.  Stop IV lasix. Weight was obtained in bed. Asked staff to weigh standing.   - Will place on  Torsemide 60 mg daily. At home she was taking torsemide 40/20.  - Renal function ok.  - Can take out foley.    3. AKI on CKD 3b post-op  - baseline SCr 1.6-1.8 - Scr 4.1 ->->-> 2.4 today. Likely post-op ATN - Plan as above - Keep MAP >= 70 - diuretics per nephrology   4. Longstanding Persistent Atrial Fibrillation s/p Maze - CHADSVASC 4+  - ASA 324 per TCTS -off digoxin with AKI  -Back in A fib today with  controlled rate.  - continue low dose amiodarone 200 mg PO Daily -On eliquis 5 mg twice a day.   5. Morbid Obesity - needs weight loss  6. DM2 - SSI   7. Hyponatremia - sodium improved to 134 today  8. Hypokalemia - K 3.6 today.  - Cut back K to 40 meq daily.      Length of Stay: [place] NP-C   01/06/2021, 7:34 AM  Advanced Heart Failure Team Pager [phone] (M-F; 7a - 4p)  Please contact [facility] for night-coverage after hours (4p -7a ) and weekends on [email or web address]    Patient seen and examined with the above-signed Advanced Practice Provider and/or Housestaff. I personally reviewed laboratory data, imaging studies and relevant notes. I independently examined the patient and formulated the important aspects of the plan. I have edited the note to reflect any of my changes or salient points. I have personally discussed the plan with the patient and/or family.  Chest still sore. Volume status improved. Co-ox stable on milrinone 0.25. Back in AF as of 6a today. Rate is controlled. Creatinine continues to get better.  General:  Sitting in bed No resp difficulty HEENT: normal Neck: supple. no JVD. Carotids 2+ bilat; no bruits. No lymphadenopathy or thryomegaly appreciated. Cor: PMI nondisplaced. Irregular rate & rhythm. No rubs, gallops or murmurs. Lungs: clear Abdomen: obese soft, nontender, nondistended. No hepatosplenomegaly. No bruits or masses. Good bowel sounds. Extremities: no cyanosis, clubbing, rash, edema Neuro: alert & orientedx3, cranial nerves grossly intact. moves all 4 extremities w/o difficulty. Affect pleasant  Doing fairly well but back in AF. Will restart IV amio. Cut milrinone back to 0.125. Continue apixaban.   Glori Bickers, MD  3:34 PM

## 2021-01-06 NOTE — Progress Notes (Signed)
  Amiodarone Drug - Drug Interaction Consult Note  Recommendations: No adjustments with torsemide or crestor - has prn zofran ordered (monitor frequency of use and consider EKG if high frequency) - monitor vitals and for future medication interactions.  Amiodarone is metabolized by the cytochrome P450 system and therefore has the potential to cause many drug interactions. Amiodarone has an average plasma half-life of 50 days (range 20 to 100 days).   There is potential for drug interactions to occur several weeks or months after stopping treatment and the onset of drug interactions may be slow after initiating amiodarone.   [x]  Statins: Increased risk of myopathy. Simvastatin- restrict dose to 20mg  daily. Other statins: counsel patients to report any muscle pain or weakness immediately.  []  Anticoagulants: Amiodarone can increase anticoagulant effect. Consider warfarin dose reduction. Patients should be monitored closely and the dose of anticoagulant altered accordingly, remembering that amiodarone levels take several weeks to stabilize.  []  Antiepileptics: Amiodarone can increase plasma concentration of phenytoin, the dose should be reduced. Note that small changes in phenytoin dose can result in large changes in levels. Monitor patient and counsel on signs of toxicity.  []  Beta blockers: increased risk of bradycardia, AV block and myocardial depression. Sotalol - avoid concomitant use.  []   Calcium channel blockers (diltiazem and verapamil): increased risk of bradycardia, AV block and myocardial depression.  []   Cyclosporine: Amiodarone increases levels of cyclosporine. Reduced dose of cyclosporine is recommended.  []  Digoxin dose should be halved when amiodarone is started.  [x]  Diuretics: increased risk of cardiotoxicity if hypokalemia occurs.  []  Oral hypoglycemic agents (glyburide, glipizide, glimepiride): increased risk of hypoglycemia. Patient's glucose levels should be monitored  closely when initiating amiodarone therapy.   [x]  Drugs that prolong the QT interval:  Torsades de pointes risk may be increased with concurrent use - avoid if possible.  Monitor QTc, also keep magnesium/potassium WNL if concurrent therapy can't be avoided. Marland Kitchen Antibiotics: e.g. fluoroquinolones, erythromycin. . Antiarrhythmics: e.g. quinidine, procainamide, disopyramide, sotalol. . Antipsychotics: e.g. phenothiazines, haloperidol.  . Lithium, tricyclic antidepressants, and methadone.  Thank You,  Antonietta Jewel, PharmD, [place] Pharmacist  Phone: [phone] 01/06/2021 3:52 PM  Please check AMION for all [facility] phone numbers After 10:00 PM, call [place] [phone]

## 2021-01-06 NOTE — Discharge Summary (Signed)
Physician Discharge Summary  Patient ID: [name] MRN: [id number] DOB/AGE: 05/09/1956 65 y.o.  Admit date: 12/19/2020 Discharge date: 01/12/2021  Admission Diagnoses:  Patient Active Problem List   Diagnosis Date Noted  . Encounter for preoperative dental examination   . Teeth missing   . Gingivitis   . Accretions on teeth   . CHF exacerbation ([place]) 12/21/2020  . Acute on chronic heart failure ([place]) 12/19/2020  . Atrial fibrillation, permanent ([place]) 11/02/2020  . Chronic combined systolic and diastolic heart failure ([place]) 05/06/2020  . NICM (nonischemic cardiomyopathy) ([place]) 04/01/2020  . Family history of heart disease 04/01/2020  . Mitral regurgitation 04/01/2020  . Severe tricuspid regurgitation 04/01/2020  . Closed right ankle fracture 09/24/2019  . DKA, type 2 ([place]) 09/24/2019  . HHNC (hyperglycemic hyperosmolar nonketotic coma) ([place]) 02/21/2019  . Hypokalemia 02/21/2019  . Acute kidney injury superimposed on CKD ([place]) 02/21/2019  . Acute on chronic left systolic heart failure ([place]) 07/01/2018  . Congestive heart failure with left ventricular diastolic dysfunction, acute ([place]) 07/01/2018  . Right upper lobe pneumonia 05/14/2018  . Atrial fibrillation with RVR ([place]) 05/14/2018  . Chronic atrial fibrillation ([place]) 12/26/2017  . Diastolic dysfunction 29/52/8413  . Diabetes mellitus type 2 in obese ([place]) 12/26/2017  . Nausea vomiting and diarrhea   . Chest pain 02/26/2012  . OSA (obstructive sleep apnea) 09/21/2011  . Hypoxia 09/21/2011  . Diabetes mellitus ([place]) 02/04/2010  . Morbid obesity ([place]) 02/04/2010  . Atrial fibrillation ([place]) 02/04/2010  . Gastroparesis 02/04/2010  . Acute gastroenteritis 02/04/2010   Discharge Diagnoses:   Patient Active Problem List   Diagnosis Date Noted  . 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  . Encounter for preoperative dental examination   . Teeth missing   . Gingivitis   . Accretions on teeth   . CHF exacerbation ([place])  12/21/2020  . Acute on chronic heart failure ([place]) 12/19/2020  . Atrial fibrillation, permanent ([place]) 11/02/2020  . Chronic combined systolic and diastolic heart failure ([place]) 05/06/2020  . NICM (nonischemic cardiomyopathy) ([place]) 04/01/2020  . Family history of heart disease 04/01/2020  . Mitral regurgitation 04/01/2020  . Severe tricuspid regurgitation 04/01/2020  . Closed right ankle fracture 09/24/2019  . DKA, type 2 ([place]) 09/24/2019  . HHNC (hyperglycemic hyperosmolar nonketotic coma) ([place]) 02/21/2019  . Hypokalemia 02/21/2019  . Acute kidney injury superimposed on CKD ([place]) 02/21/2019  . Acute on chronic left systolic heart failure ([place]) 07/01/2018  . Congestive heart failure with left ventricular diastolic dysfunction, acute ([place]) 07/01/2018  . Right upper lobe pneumonia 05/14/2018  . Atrial fibrillation with RVR ([place]) 05/14/2018  . Chronic atrial fibrillation ([place]) 12/26/2017  . Diastolic dysfunction 24/40/1027  . Diabetes mellitus type 2 in obese ([place]) 12/26/2017  . Nausea vomiting and diarrhea   . Chest pain 02/26/2012  . OSA (obstructive sleep apnea) 09/21/2011  . Hypoxia 09/21/2011  . Diabetes mellitus ([place]) 02/04/2010  . Morbid obesity ([place]) 02/04/2010  . Atrial fibrillation ([place]) 02/04/2010  . Gastroparesis 02/04/2010  . Acute gastroenteritis 02/04/2010   Discharged Condition: good  History of Present Illness:  65 yo woman with a history of obesity, poorly controlled type 2 diabetes, hyperlipidemia, stage IV CKD, sleep apnea, hypertension, persistent atrial fibrillation, severe MR and severe TR.  She presented to the ED with left-sided chest pain and dyspnea with ambulation and at rest. EKG was without ST changes and pain was not relieved with nitro paste. The patient is on diuretics at home and sleeps on 2-3 pillows at night.  She can't sleep flat due to orthopnea.  Her weight was also up about 30 lbs from her baseline. Workup consistent with decompensated acute on  chronic systolic and diastolic heart failure.  She was admitted for medical management. TEE in April showed EF 45-50%, severe MR with restricted posterior leaflet, severe TR and bilateral atrial enlargement. Right heart cath this admission showed PA O2 sat 58%, CI = 1.9, PA 44/15 and PCWP 21 with V waves to 35.  Hospital Course:   She was treated with aggressive medical management.  Advanced heart failure team was consulted to aide in management.  Cardiothoracic surgery consultation was obtained and the patient was evaluated by Dr. Roxan Hockey who felt patient would benefit from valvular replacement and MAZE procedure with her Atrial Fibrillation.  Dental clearance was obtained prior to proceeding with surgery.  She was taken to the operating room on 12/29/2020.  She underwent Mitral and Tricuspid Valvular ring annuloplasty, Left and Right sided MAZE procedure, and clipping of her Left Atrial Appendage.  She tolerated the procedure without difficulty and was taken to the SICU in stable condition.  She was extubated the evening of surgery. She required inotropic/pressor support with Levophed, Vasopressin and Milrinone.  Her chest tubes were removed on 12/30/2020.  She was also taken off Levophed and Vasopressin on POD #1.  She was in NSR with 1st degree AV Block on Amiodarone.  She was restarted on home Digoxin.  Advanced heart failure consult was obtained.  They started the patient on Lasix drip.  She also received a dose of Metolazone.  The recommended increase in Milrinone drip.  They obtained a repeat Echocardiogram which showed a preserved EF of 55%, hypokinesis of the RV, Moderate TR, but no significant MR.  No pericardial effusion was present.  The patient developed post op AKI on CKD. Nephrology consult was obtained and they agreed with initiated of Lasix drip.  The patient diuresed well with aggressive diuretic regimen.  She developed Asterixis felt to be due to Gabapentin which was discontinued.  She made  slow progress.  She was transferred to the progressive care unit on 01/04/2021.  She was transitioned off Lasix drip on 5/17.  She was initiated on IV Lasix by Nephrology the same day.  Una boots were applied to bilateral lower extremity to help with edema.  Her creatinine level peaked at 4.36.  This has since recovered with most recent creatinine level being 2.75.    She was supported with IV milrinone for several days after surgery. This was managed by the advanced heart failure team and was eventually weaned and discontinued by post-op day 7.   Follow up CoOx assays were stable over the next 2 days. She remained in rate-controlled atrial fibrillation so she was started on Eliquis. The cardiology team recommended DC cardioversion in about 4 weeks if the a-fib persisted.  She has been evaluated by PT/OT who recommended home health.  These arrangements have been made.  The patient is deconditioned and get short of breath and fatigued with activity.  This will improve with continued walking and working with PT at discharge.  Her surgical incisions are healing without evidence of infection.  She is medically stable for discharge home today.             Consults: Advanced Heart Failure, Nephrology  Significant Diagnostic Studies: cardiac graphics:   Echocardiogram: ( Pre-op) 1. Left ventricular ejection fraction, by estimation, is 45 to 50%. The left ventricle has mildly decreased function. The  left ventricle demonstrates global hypokinesis. 2. Right ventricular systolic function is moderately reduced. The right ventricular size is moderately enlarged. 3. Left atrial size was severely dilated. No left atrial/left atrial appendage thrombus was detected. 4. Right atrial size was severely dilated. 5. MR more appreciable early in the study when blood pressure was normal. MR jet appears less later in the study, likely due to reduction in blood pressure. Posterior leaflet appears mildly restricted, with  override of anterior leaflet. Systolic flow reversal seen in right sided pulmonary veins.. The mitral valve is abnormal. Severe mitral valve regurgitation. The mean mitral valve gradient is 2.0 mmHg. 6. Tricuspid valve regurgitation is severe. 7. The aortic valve is tricuspid. Aortic valve regurgitation is not visualized. No aortic stenosis is present. Conclusion(s)/Recommendation(s): Severe mitral regurgitation (PV systolic flow reversal) likely due to restricted posterior leaflet and overriding anterior leaflet. Severe TR.  Echocardiogram: (Post Op):  IMPRESSIONS    1. No subcostal views; Elevated mean gradient across aortic valve (mean  gradient 13 mmHg) but visually valve opens well; S/P MV repair with mean  gradient 7 mmHg and trace MR; S/P TV repair with moderate TR.  2. Left ventricular ejection fraction, by estimation, is 60 to 65%. The  left ventricle has normal function. The left ventricle has no regional  wall motion abnormalities. There is moderate left ventricular hypertrophy.  There is the interventricular  septum is flattened in systole and diastole, consistent with right  ventricular pressure and volume overload.  3. Right ventricular systolic function is normal. The right ventricular  size is mildly enlarged.  4. Left atrial size was moderately dilated.  5. Right atrial size was mildly dilated.  6. The mitral valve has been repaired/replaced. Trivial mitral valve  regurgitation. No evidence of mitral stenosis.  7. The tricuspid valve is has been repaired/replaced. Tricuspid valve  regurgitation is moderate.  8. The aortic valve is tricuspid. Aortic valve regurgitation is not  visualized. No aortic stenosis is present.   Treatments: surgery:   PROCEDURES:   Median sternotomy; extracorporeal circulation; Mitral valve annuloplasty with Carbomedics AnnuloFlex ring, reference number [id number], 30 mm, serial number [id number]; Tricuspid valve annuloplasty with  MC3 annuloplasty ring, model number 4900, 34 mm, serial number [id number]; Left- and right-sided maze procedure using radiofrequency ablation and cryo; and Left atrial appendage clip with AtriClip, 45 mm, Flex-V, lot number [id number].  Discharge Exam: Blood pressure 102/87, pulse 75, temperature 97.8 F (36.6 C), temperature source Oral, resp. rate 15, height $RemoveBe'5\' 7"'JcFtSEHQR$  (1.702 m), weight 128.1 kg, SpO2 99 %.  General appearance: alert, cooperative and no distress Heart: irregularly irregular rhythm Lungs: clear to auscultation bilaterally Abdomen: soft, non-tender; bowel sounds normal; no masses,  no organomegaly Extremities: una boots in place Wound: clean and dry  Discharge disposition: 01-Home or Self Care  Discharge Instructions    Amb Referral to Cardiac Rehabilitation   Complete by: As directed    Diagnosis: Valve Repair   Valve:  Mitral Tricuspid     After initial evaluation and assessments completed: Virtual Based Care may be provided alone or in conjunction with Phase 2 Cardiac Rehab based on patient barriers.: Yes     Allergies as of 01/12/2021      Reactions   Sulfa Antibiotics Itching      Medication List    STOP taking these medications   digoxin 0.125 MG tablet Commonly known as: LANOXIN   gabapentin 300 MG capsule Commonly known as: NEURONTIN   metoprolol succinate 100 MG 24 hr tablet  Commonly known as: TOPROL-XL     TAKE these medications   Accu-Chek Guide Me w/Device Kit Use as instructed to check blood sugar three times daily. E11.69   Accu-Chek Guide w/Device Kit USE AS INSTRUCTED TO CHECK BLOOD SUGAR THREE TIMES DAILY. E11.69   Accu-Chek Softclix Lancets lancets Use as instructed to check blood sugar three times daily. E11.69   albuterol 108 (90 Base) MCG/ACT inhaler Commonly known as: VENTOLIN HFA Inhale 2 puffs into the lungs every 4 (four) hours as needed for wheezing or shortness of breath.   amiodarone 200 MG tablet Commonly known as:  Pacerone Take 1 tablet (200 mg total) by mouth 2 (two) times daily.   apixaban 5 MG Tabs tablet Commonly known as: Eliquis Take 1 tablet (5 mg total) by mouth 2 (two) times daily.   aspirin-sod bicarb-citric acid 325 MG Tbef tablet Commonly known as: ALKA-SELTZER Take 325 mg by mouth every 6 (six) hours as needed (indigestion).   B-D SINGLE USE SWABS REGULAR Pads   DULoxetine 60 MG capsule Commonly known as: CYMBALTA Take 1 capsule (60 mg total) by mouth daily.   EPINEPHrine 0.3 mg/0.3 mL Soaj injection Commonly known as: EPI-PEN Inject 0.3 mg into the muscle as needed for anaphylaxis. What changed: additional instructions   insulin aspart 100 UNIT/ML injection Commonly known as: NovoLOG Inject 12 Units into the skin 3 (three) times daily before meals. For blood sugars 0-199 give 0 units of insulin, 201-250 give 4 units, 251-300 give 6 units, 301-350 give 8 units, 351-400 give 10 units,> 400 give 12 units and call M.D. What changed:   how much to take  additional instructions   Levemir 100 UNIT/ML injection Generic drug: insulin detemir INJECT 0.4 MLS (40 UNITS TOTAL) INTO THE SKIN 2 (TWO) TIMES DAILY. What changed: See the new instructions.   MULTIVITAMIN WOMEN PO Take 1 tablet by mouth daily.   nitroGLYCERIN 0.4 MG SL tablet Commonly known as: NITROSTAT Place 1 tablet (0.4 mg total) under the tongue every 5 (five) minutes x 3 doses as needed for chest pain.   oxyCODONE 5 MG immediate release tablet Commonly known as: Oxy IR/ROXICODONE Take 1 tablet (5 mg total) by mouth every 4 (four) hours as needed for severe pain.   Potassium Chloride ER 20 MEQ Tbcr Take 20 mEq by mouth daily.   Restasis 0.05 % ophthalmic emulsion Generic drug: cycloSPORINE Place 1 drop into both eyes 2 (two) times daily.   rosuvastatin 10 MG tablet Commonly known as: CRESTOR Take 1 tablet (10 mg total) by mouth daily.   torsemide 20 MG tablet Commonly known as: Demadex Take 3 tablets  (60 mg total) by mouth daily. What changed:   medication strength  how much to take  when to take this   True Metrix Blood Glucose Test test strip Generic drug: glucose blood USE AS INSTRUCTED TO CHECK BLOOD SUGAR DAILY.   Trulicity 1.5 FQ/7.2UV Sopn Generic drug: Dulaglutide INJECT 1.5 MG INTO THE SKIN ONCE A WEEK.       Follow-up Information    [facility] [place] Follow up.   Specialty: Home Health Services Why: HHPT, [place] information: [place] [address] [phone]        [facility] Follow up.   Why: Shower chair to be delivered to the room prior to transition home.  Contact information: [place] [address] [phone]        [facility] Follow up on 01/25/2021.   Specialty: Cardiology Why: 1030. Entrance C at the [facility] at [facility] information: [address] [id number] [place]       [name], MD. Go on 02/01/2021.   Specialty: Cardiothoracic Surgery Why: Your appointment is at 3:00pm. Please arrive 30 minutes early for a chest x-ray to be performed by [facility] located on the first floor of the same building.  Contact information: [address] [place] [address] [phone]        [name], NP Follow up.   Specialty: Internal Medicine Why: Hospital follow scheduled for Tuesday 02/15/2021 at 9:00am Contact information: [address] [place] [address] [phone]              The patient has been discharged on:   1.Beta Blocker:  Yes [   ]                              No   [ X  ]                              If No, reason: bradycardia  2.Ace Inhibitor/ARB: Yes [   ]                                     No  [ X   ]                                     If No, reason: elevated  creatinine  3.Statin:   Yes [ X  ]                  No  [   ]                  If No, reason:  4.Ecasa:  Yes  [   ]                  No   [ X  ]                  If No, reason: on Eliquis   Signed:  Estha Few, PA-C

## 2021-01-07 DIAGNOSIS — I340 Nonrheumatic mitral (valve) insufficiency: Secondary | ICD-10-CM | POA: Diagnosis not present

## 2021-01-07 DIAGNOSIS — I5033 Acute on chronic diastolic (congestive) heart failure: Secondary | ICD-10-CM | POA: Diagnosis not present

## 2021-01-07 LAB — RENAL FUNCTION PANEL
Albumin: 2.7 g/dL — ABNORMAL LOW (ref 3.5–5.0)
Anion gap: 7 (ref 5–15)
BUN: 60 mg/dL — ABNORMAL HIGH (ref 8–23)
CO2: 35 mmol/L — ABNORMAL HIGH (ref 22–32)
Calcium: 9.1 mg/dL (ref 8.9–10.3)
Chloride: 91 mmol/L — ABNORMAL LOW (ref 98–111)
Creatinine, Ser: 2.38 mg/dL — ABNORMAL HIGH (ref 0.44–1.00)
GFR, Estimated: 22 mL/min — ABNORMAL LOW (ref >60)
Glucose, Bld: 109 mg/dL — ABNORMAL HIGH (ref 70–99)
Phosphorus: 4 mg/dL (ref 2.5–4.6)
Potassium: 3.2 mmol/L — ABNORMAL LOW (ref 3.5–5.1)
Sodium: 133 mmol/L — ABNORMAL LOW (ref 135–145)

## 2021-01-07 LAB — GLUCOSE, CAPILLARY
Glucose-Capillary: 106 mg/dL — ABNORMAL HIGH (ref 70–99)
Glucose-Capillary: 165 mg/dL — ABNORMAL HIGH (ref 70–99)
Glucose-Capillary: 128 mg/dL — ABNORMAL HIGH (ref 70–99)
Glucose-Capillary: 122 mg/dL — ABNORMAL HIGH (ref 70–99)

## 2021-01-07 LAB — COOXEMETRY PANEL
Carboxyhemoglobin: 1.6 % — ABNORMAL HIGH (ref 0.5–1.5)
Methemoglobin: 1 % (ref 0.0–1.5)
O2 Saturation: 59.8 %
Total hemoglobin: 8.4 g/dL — ABNORMAL LOW (ref 12.0–16.0)

## 2021-01-07 MED ORDER — POTASSIUM CHLORIDE CRYS ER 20 MEQ PO TBCR: 40.0000 meq | EXTENDED_RELEASE_TABLET | Freq: Once | ORAL | Status: DC

## 2021-01-07 MED ORDER — PROSOURCE PLUS PO LIQD
30.0000 mL | Freq: Two times a day (BID) | ORAL | Status: DC
  Administered 2021-01-07: 30 mL via ORAL
  Filled 2021-01-07 (×4): qty 30

## 2021-01-07 MED ORDER — POTASSIUM CHLORIDE ER 10 MEQ PO TBCR: 10.0000 meq | EXTENDED_RELEASE_TABLET | Freq: Every day | ORAL | Status: DC

## 2021-01-07 MED ORDER — AMIODARONE HCL 200 MG PO TABS
200.0000 mg | ORAL_TABLET | Freq: Two times a day (BID) | ORAL | Status: DC
  Administered 2021-01-07 – 2021-01-12 (×11): 200 mg via ORAL
  Filled 2021-01-07 (×11): qty 1

## 2021-01-07 MED ORDER — ADULT MULTIVITAMIN W/MINERALS CH
1.0000 | ORAL_TABLET | Freq: Every day | ORAL | Status: DC
  Administered 2021-01-07 – 2021-01-12 (×6): 1 via ORAL
  Filled 2021-01-07 (×6): qty 1

## 2021-01-07 NOTE — Progress Notes (Addendum)
[address]       [place] [address]             [phone]      8 Days Post-Op Procedure(s) (LRB): MITRAL VALVE REPAIR USING CARBOMEDICS ANNULOFLEX RING SIZE 30 (N/A) 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 RING SIZE 34 (N/A) MAZE (N/A) TRANSESOPHAGEAL ECHOCARDIOGRAM (TEE) (N/A)   Subjective:  Awake and alert, no new concerns. Said "I'm not going to complain".  No new events past 24 hr.   Amiodarone infusing at 30mg /hr, milrinone at 0.11mcg/kg/min.  CoOx 59.8  CVP 10-11.   Objective: Vital signs in last 24 hours: Temp:  [97.4 F (36.3 C)-98.3 F (36.8 C)] 97.4 F (36.3 C) (05/20 0751) Pulse Rate:  [64-70] 69 (05/20 0751) Cardiac Rhythm: Atrial fibrillation;Atrial flutter (05/19 2029) Resp:  [16-20] 18 (05/20 0751) BP: (101-135)/(50-71) 118/58 (05/20 0751) SpO2:  [95 %-100 %] 95 % (05/20 0751) Weight:  [127.9 kg-129 kg] 127.9 kg (05/20 0500)  Hemodynamic parameters for last 24 hours: CVP:  [6 mmHg-9 mmHg] 6 mmHg  Intake/Output from previous day: 05/19 0701 - 05/20 0700 In: 1283.4 [P.O.:750; I.V.:533.4] Out: 1800 [Urine:1800] Intake/Output this shift: No intake/output data recorded.  General appearance: alert, cooperative and no distress Heart: irregularly irregular rhythm, monitor showing a-fib with rates high 50's- 70. Lungs: clear to auscultation bilaterally Abdomen: soft, non-tender; bowel sounds normal Extremities: una boots in place BLE Wound: clean and dry  Lab Results: Recent Labs    01/05/21 0504  WBC 10.1  HGB 7.7*  HCT 23.6*  PLT 226   BMET:  Recent Labs    01/06/21 0513 01/07/21 0432  NA 134* 133*  K 3.6 3.2*  CL 92* 91*  CO2 34* 35*  GLUCOSE 146* 109*  BUN 62* 60*  CREATININE 2.36* 2.38*  CALCIUM 9.2 9.1    PT/INR: No results for input(s): LABPROT, INR in the last 72 hours. ABG    Component Value Date/Time   PHART 7.368 12/30/2020 0050   HCO3 23.0 12/30/2020 0050   TCO2 24 12/30/2020  0050   ACIDBASEDEF 2.0 12/30/2020 0050   O2SAT 59.8 01/07/2021 0432   CBG (last 3)  Recent Labs    01/06/21 1611 01/06/21 2124 01/07/21 0624  GLUCAP 135* 149* 106*    Assessment/Plan: S/P Procedure(s) (LRB): MITRAL VALVE REPAIR USING CARBOMEDICS ANNULOFLEX RING SIZE 30 (N/A) 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 RING SIZE 34 (N/A) MAZE (N/A) TRANSESOPHAGEAL ECHOCARDIOGRAM (TEE) (N/A)  -POD9 mitral valve repair and tricuspid valve repair and MAZE presenting with acute on chronic heart failure and persistent atrial fibrillation. Remains on milrinone at 0.132mcg/kg/min. Mgt per HF team.  She is progressing slowly with mobility.   -Atrial fibrillation- this is chronic. Rate is controlled and she is on Eliquis. Hoping for eventual conversion following MAZE. She is back on IV amio.  -Type 2 DM- Currently on BID Levemir 30 units and Novalog CC along with SSI.  Glucose 106-174 range past 24 hours.  She has poor appetite and minimal oral intake.   -Expected acute blood loss anemia- last Hct 23.6 on 5/18 and trend had bennslightly down over previous few days.  Add Trinsicon, repeat lab in AM.   -Acute on chronic renal insufficiency- Cr peaked at 4.1, appears to have levelled off at ~2.4. and she is diuresing. Monitor.   -Hypokalemia- will give an extra dose K-dur 9mEq today.     LOS: 17 days    Antony Odea, PA-C  [phone] 01/07/2021  Patient seen and examined, agree with above Rate controlled A fib on IV amio Continue to mobilize Does have some numbness right 4th and 5th fingers c/w brachial plexopathy- should resolve with time  Remo Lipps C. Roxan Hockey, MD [facility] [phone]

## 2021-01-07 NOTE — Progress Notes (Signed)
Initial Nutrition Assessment  DOCUMENTATION CODES:   Obesity unspecified  INTERVENTION:   Liberalize diet to provide more option   ProSource Plus 30 ml BID, each supplement provides 100 kcals and 15 grams protein.   Snacks BID  MVI daily   NUTRITION DIAGNOSIS:   Increased nutrient needs related to post-op healing as evidenced by estimated needs.  GOAL:   Patient will meet greater than or equal to 90% of their needs  MONITOR:   PO intake,Supplement acceptance,Weight trends,Labs,I & O's  REASON FOR ASSESSMENT:   LOS (poor PO)    ASSESSMENT:   Patient with PMH significant for CHF, afib, CKD III, GERD, COPD, HLD, HTN, and DM. Presents this admission with CHF exacerbation.   5/11- s/p MVR/TVR   Patient endorses poor appetite post op. States she consumed a few bites of breakfast this am without complication. Last four meal completions charted as 50%, 50%, 25%, and 30%. RD suggested supplement to maximize protein and kcal. Patient does not like milky supplements. RD to try ProSource and snacks.   Upon admission patient reported 200 lb weight loss since the fall. Weight records reflect patient has maintained her weight.   Wt Readings from Last 10 Encounters:  01/07/21 127.9 kg  11/22/20 (!) 139.3 kg  11/05/20 125.7 kg  11/02/20 (!) 136.8 kg  09/27/20 123.7 kg  09/08/20 125.2 kg  07/27/20 122.5 kg  07/13/20 126.4 kg  07/05/20 122.5 kg  06/24/20 127.9 kg   UOP: 1800 ml x 24 hrs   Medications: dulcolax, colace, SS novolog, levemir, lactulose, 40 mEq KCl daily, demadex Labs: Na 133 (L) K 3.2 (L) Cr 2.38-trending down  NUTRITION - FOCUSED PHYSICAL EXAM:  Flowsheet Row Most Recent Value  Orbital Region No depletion  Upper Arm Region No depletion  Thoracic and Lumbar Region Unable to assess  Buccal Region No depletion  Temple Region No depletion  Clavicle Bone Region No depletion  Clavicle and Acromion Bone Region No depletion  Scapular Bone Region Unable to  assess  Dorsal Hand No depletion  Patellar Region No depletion  Anterior Thigh Region No depletion  Posterior Calf Region No depletion  Edema (RD Assessment) Unable to assess  Hair Reviewed  Eyes Reviewed  Mouth Reviewed  Skin Reviewed  Nails Reviewed     Diet Order:   Diet Order            Diet heart healthy/carb modified Room service appropriate? Yes; Fluid consistency: Thin  Diet effective now                 EDUCATION NEEDS:   Education needs have been addressed  Skin:  Skin Assessment: Reviewed RN Assessment  Last BM:  5/19  Height:   Ht Readings from Last 1 Encounters:  01/04/21 5\' 7"  (1.702 m)    Weight:   Wt Readings from Last 1 Encounters:  01/07/21 127.9 kg   BMI:  Body mass index is 44.17 kg/m.  Estimated Nutritional Needs:   Kcal:  1800-2000 kcal  Protein:  90-105 grams  Fluid:  >/= 1.8 L/day  Mariana Single RD, LDN Clinical Nutrition Pager listed in [place]

## 2021-01-07 NOTE — Progress Notes (Signed)
CARDIAC REHAB PHASE I   PRE:  Rate/Rhythm: 65 afib  BP:  Supine:   Sitting: 117/99 Lower arm Standing:    SaO2: 97%RA  MODE:  Ambulation: 180 ft   POST:  Rate/Rhythm: 73 afib   BP:  Supine:   Sitting: 109/36 Lower arm Standing:    SaO2: 97%RA 1013-1110 Pt walked 180 ft on RA with rolling walker and asst x 2. Followed with rollator and pt sat three times to rest. Pt motivated to walk farther today. Used music while walking. To recliner with call bell after walk. BP taken several times lower arm. PT to see this pm.  Sats good on RA.    Graylon Good, RN BSN  01/07/2021 11:08 AM

## 2021-01-07 NOTE — Progress Notes (Addendum)
Advanced Heart Failure Rounding Note   Subjective:    Yesterday milrinone cut back to 0.125 mcg. CO-OX 59%.   SCr continues to improve 3.1 -> 2.7 ->2.4 ->2.4   Echo LVEF 55% RV moderately HK. Moderate TR. No significant MR. No effusion .  Feeling better. Denies SOB.    Objective:   Weight Range:  Vital Signs:   Temp:  [97.4 F (36.3 C)-98.3 F (36.8 C)] 97.4 F (36.3 C) (05/20 0751) Pulse Rate:  [64-70] 69 (05/20 0751) Resp:  [16-20] 18 (05/20 0751) BP: (101-135)/(50-71) 118/58 (05/20 0751) SpO2:  [95 %-100 %] 95 % (05/20 0751) Weight:  [127.9 kg] 127.9 kg (05/20 0500) Last BM Date: 01/06/21  Weight change: Filed Weights   01/06/21 0600 01/06/21 0812 01/07/21 0500  Weight: 130.9 kg 129 kg 127.9 kg    Intake/Output:   Intake/Output Summary (Last 24 hours) at 01/07/2021 1024 Last data filed at 01/07/2021 0600 Gross per 24 hour  Intake 1283.35 ml  Output 550 ml  Net 733.35 ml      PHYSICAL EXAM: General:  No resp difficulty. Sitting on the side of the bed.  HEENT: normal Neck: supple. JVP 8-9 . Carotids 2+ bilat; no bruits. No lymphadenopathy or thryomegaly appreciated. Cor: PMI nondisplaced. Irregular rate & rhythm. No rubs, gallops or murmurs. Lungs: clear Abdomen: obese, soft, nontender, nondistended. No hepatosplenomegaly. No bruits or masses. Good bowel sounds. Extremities: no cyanosis, clubbing, rash, edema. Unna boots  Neuro: alert & orientedx3, cranial nerves grossly intact. moves all 4 extremities w/o difficulty. Affect pleasant   Telemetry: A fib 60-70s   Labs: Basic Metabolic Panel: Recent Labs  Lab 01/04/21 0339 01/04/21 1452 01/05/21 0504 01/06/21 0513 01/07/21 0432  NA 131* 132* 135 134* 133*  K 3.2* 3.5 3.3* 3.6 3.2*  CL 92* 91* 93* 92* 91*  CO2 29 30 34* 34* 35*  GLUCOSE 154* 174* 139* 146* 109*  BUN 66* 68* 64* 62* 60*  CREATININE 3.60* 3.09* 2.73* 2.36* 2.38*  CALCIUM 9.1 8.9 9.2 9.2 9.1  PHOS  --   --  3.2 3.6 4.0     Liver Function Tests: Recent Labs  Lab 01/02/21 0433 01/04/21 0339 01/05/21 0504 01/06/21 0513 01/07/21 0432  AST 30 21  --   --   --   ALT <5 5  --   --   --   ALKPHOS 93 104  --   --   --   BILITOT 0.9 0.8  --   --   --   PROT 7.5 7.1  --   --   --   ALBUMIN 3.5 3.0* 2.8* 2.8* 2.7*   No results for input(s): LIPASE, AMYLASE in the last 168 hours. No results for input(s): AMMONIA in the last 168 hours.  CBC: Recent Labs  Lab 01/01/21 0353 01/02/21 0433 01/03/21 0419 01/04/21 0339 01/05/21 0504  WBC 20.2* 17.8* 12.7* 10.1 10.1  HGB 8.2* 8.2* 7.8* 7.7* 7.7*  HCT 26.0* 25.6* 24.4* 23.8* 23.6*  MCV 91.5 89.8 89.1 88.1 88.7  PLT 144* 178 196 200 226    Cardiac Enzymes: No results for input(s): CKTOTAL, CKMB, CKMBINDEX, TROPONINI in the last 168 hours.  BNP: BNP (last 3 results) Recent Labs    03/02/20 1527 11/22/20 1527 12/19/20 1016  BNP 43.7 132.8* 227.9*    ProBNP (last 3 results) No results for input(s): PROBNP in the last 8760 hours.    Other results:  Imaging: No results found.   Medications:  Scheduled Medications: . apixaban  5 mg Oral BID  . bisacodyl  10 mg Oral Daily   Or  . bisacodyl  10 mg Rectal Daily  . Chlorhexidine Gluconate Cloth  6 each Topical Daily  . [facility]iac Surgery, Patient & Family Education   Does not apply Once  . docusate sodium  200 mg Oral Daily  . DULoxetine  60 mg Oral Daily  . insulin aspart  0-20 Units Subcutaneous TID WC  . insulin aspart  0-5 Units Subcutaneous QHS  . insulin aspart  3 Units Subcutaneous TID WC  . insulin detemir  30 Units Subcutaneous BID  . lactulose  20 g Oral Daily  . mouth rinse  15 mL Mouth Rinse BID  . pantoprazole  40 mg Oral Daily  . potassium chloride  40 mEq Oral Daily  . potassium chloride  40 mEq Oral Once  . rosuvastatin  10 mg Oral Daily  . sodium chloride flush  3 mL Intravenous Q12H  . torsemide  60 mg Oral Daily    Infusions: . sodium chloride     . amiodarone 30 mg/hr (01/07/21 0659)  . milrinone 0.125 mcg/kg/min (01/07/21 0827)  . promethazine (PHENERGAN) injection (IM or IVPB) 6.25 mg (01/03/21 0829)    PRN Medications: sodium chloride, dextrose, metoprolol tartrate, ondansetron (ZOFRAN) IV, oxyCODONE, promethazine (PHENERGAN) injection (IM or IVPB), sodium chloride flush, sodium chloride flush   Assessment/Plan:   1. Valvular heart disease s/p MVR/TVR  on 5/12 - Severe TRs/p annuloplasty. Severe MRs/p annuloplasty on 12/30/20 - Echo 01/02/21 LVEF 55% RV moderately HK. Moderate TR. No significant MR. No effusion.  2. Acute on chronic systolic HF with R>>L HF - TEE 4/22 EF 45-50% RV moderately HK  - Echo LVEF 55% RV moderately HK. Moderate TR. No significant MR. No effusion. - CO-OX 60%.Stop milrinone.  - Volume status stable. Continue Torsemide 60 mg daily. At home she was taking torsemide 40/20. Supp K .  - Renal function ok.  - No spiro with AKI - Hold off on SGLT2i for now    3. AKI on CKD 3b post-op  - baseline SCr 1.6-1.8 - Scr 4.1 ->->-> 2.4 today. Likely post-op ATN - Plan as above - Keep MAP >= 70 - diuretics per nephrology   4. Longstanding Persistent Atrial Fibrillation s/p Maze - CHADSVASC 4+  - ASA 324 per TCTS - On amio drip with controlled rate.  -off digoxin with AKI and EF 55%.  -On eliquis 5 mg twice a day.   5. Morbid Obesity - needs weight loss  6. DM2 - SSI  - Consider SGLT2i. GFR now 22. Consider as an outpatient.   7. Hyponatremia - sodium improved to 133 today  8. Hypokalemia - K 3.2 today.  - Supp K    Length of Stay: Cl[place] NP-C   01/07/2021, 10:24 AM  Advanced Heart Failure Team Pager [phone] (M-F; 7a - 4p)  Please contact [facility] for night-coverage after hours (4p -7a ) and weekends on [email or web address]  Patient seen and examined with the above-signed Advanced Practice Provider and/or Housestaff. I personally reviewed laboratory data, imaging studies  and relevant notes. I independently examined the patient and formulated the important aspects of the plan. I have edited the note to reflect any of my changes or salient points. I have personally discussed the plan with the patient and/or family.  Continues to make slow progress. Remains on milrinone 0.125. Co-ox 60%   Remains in AF.  Rate controlled. Switched back to IV amio yesterday.  Still SOB with ambulation. Creatinine plateaued.   General:  Obese woman walking hall  HEENT: normal Neck: supple. no JVD. Carotids 2+ bilat; no bruits. No lymphadenopathy or thryomegaly appreciated. Cor: PMI nondisplaced. Regular rate & rhythm. No rubs, gallops or murmurs. Lungs: clear dull at right base Abdomen: obese  soft, nontender, nondistended. No hepatosplenomegaly. No bruits or masses. Good bowel sounds. Extremities: no cyanosis, clubbing, rash, tr edema Neuro: alert & orientedx3, cranial nerves grossly intact. moves all 4 extremities w/o difficulty. Affect pleasant  Progressing slowly.  Can stop milrinone. Change lasix to po torsemide. Will change amio back to po. Hopefully can go home soon and will consider outpatient DC-CV in 4-8 weeks if still in AF. Supp K+   Glori Bickers, MD  11:10 AM

## 2021-01-07 NOTE — Progress Notes (Signed)
Physical Therapy Treatment Patient Details Name: [name] MRN: [id number] DOB: [birth year] Today's Date: 01/07/2021    History of Present Illness Patient is a 65 y/o female admitted on 12/19/20 with SOB, chest pain, N/V and emesis. Admitted with CHF exacerbation. s/p MVR,TVR and Maze 12/29/20. PMHx: HTN. DM2, A-fib, CHF, OSA, DVT, COPD.    PT Comments    Progressing steadily toward goals now.  Emphasis on transitions, sit to stand from different heights, progression of gait stability and distance/stamina.  Reinforcement of sternal precautions as it relates to each task.    Follow Up Recommendations  Home health PT;Supervision - Intermittent     Equipment Recommendations  None recommended by PT (bari RW)    Recommendations for Other Services       Precautions / Restrictions Precautions Precautions: Fall;Sternal    Mobility  Bed Mobility Overal bed mobility: Needs Assistance Bed Mobility: Rolling;Sidelying to Sit;Sit to Sidelying Rolling: Min guard Sidelying to sit: Mod assist     Sit to sidelying: Mod assist      Transfers Overall transfer level: Needs assistance Equipment used: Rolling walker (2 wheeled) Transfers: Sit to/from Stand Sit to Stand: Supervision         General transfer comment: hands on knees, stood with relative ease and minimal momentum  Ambulation/Gait Ambulation/Gait assistance: Min guard Gait Distance (Feet): 70 Feet (then 130 with 1 sitting rest) Assistive device: Rolling walker (2 wheeled) Gait Pattern/deviations: Step-through pattern;Decreased stance time - left;Decreased stride length;Decreased stance time - right   Gait velocity interpretation: <1.8 ft/sec, indicate of risk for recurrent falls General Gait Details: slow, but steady, mild waddle.  Sats upper 90's and HR in the 60's and lower 70's with minimal exertion.   Stairs             Wheelchair Mobility    Modified Rankin (Stroke Patients Only)        Balance Overall balance assessment: History of Falls;Needs assistance   Sitting balance-Leahy Scale: Good       Standing balance-Leahy Scale: Poor Standing balance comment: still reliant on AD or minimal external support.  Confidence is improving.                            Cognition Arousal/Alertness: Awake/alert Behavior During Therapy: WFL for tasks assessed/performed Overall Cognitive Status: Within Functional Limits for tasks assessed                                        Exercises      General Comments        Pertinent Vitals/Pain Pain Assessment: Faces Faces Pain Scale: Hurts little more Pain Location: chest incision Pain Descriptors / Indicators: Discomfort Pain Intervention(s): Monitored during session    Home Living                      Prior Function            PT Goals (current goals can now be found in the care plan section) Acute Rehab PT Goals Patient Stated Goal: return to PLOF PT Goal Formulation: With patient Time For Goal Achievement: 01/03/21 Potential to Achieve Goals: Good Progress towards PT goals: Progressing toward goals    Frequency    Min 3X/week      PT Plan Current plan remains appropriate    Co-evaluation  AM-PAC PT "6 Clicks" Mobility   Outcome Measure  Help needed turning from your back to your side while in a flat bed without using bedrails?: None Help needed moving from lying on your back to sitting on the side of a flat bed without using bedrails?: A Lot Help needed moving to and from a bed to a chair (including a wheelchair)?: A Little Help needed standing up from a chair using your arms (e.g., wheelchair or bedside chair)?: A Little Help needed to walk in hospital room?: A Little Help needed climbing 3-5 steps with a railing? : A Lot 6 Click Score: 17    End of Session   Activity Tolerance: Patient tolerated treatment well Patient left: in bed;with call  bell/phone within reach;with bed alarm set;Other (comment) Nurse Communication: Mobility status PT Visit Diagnosis: Other abnormalities of gait and mobility (R26.89);Muscle weakness (generalized) (M62.81);Pain Pain - part of body:  (sternal)     Time: 7564-3329 PT Time Calculation (min) (ACUTE ONLY): 48 min  Charges:  $Gait Training: 8-22 mins $Therapeutic Activity: 23-37 mins                     01/07/2021  Ginger Carne., PT Acute Rehabilitation Services [phone]  (pager) [phone]  (office)   Tessie Fass Kateline Kinkade 01/07/2021, 5:55 PM

## 2021-01-07 NOTE — Progress Notes (Addendum)
Occupational Therapy Treatment Patient Details Name: [name] MRN: [id number] DOB: 1956/01/14 Today's Date: 01/07/2021    History of present illness Patient is a 65 y/o female admitted on 12/19/20 with SOB, chest pain, N/V and emesis. Admitted with CHF exacerbation. s/p MVR,TVR and Maze 12/29/20. PMHx: HTN. DM2, A-fib, CHF, OSA, DVT, COPD.   OT comments  Pt completed seated grooming with set up, UB bathing with assist for back and UB dressing with min assist due to lines. Continues to needs total assist for posterior pericare. Stood from chair with supervision without UE use. C/O fatigue in standing, but no knee buckling noted. Issued squeeze ball and med soft theraputty, pt reporting weakness in R hand and dropping items. VSS on RA.    Follow Up Recommendations  Home health OT;Supervision/Assistance - 24 hour    Equipment Recommendations  Tub/shower bench;Other (comment) (AE)    Recommendations for Other Services      Precautions / Restrictions Precautions Precautions: Fall;Sternal Precaution Comments: knees buckle Restrictions Weight Bearing Restrictions: No       Mobility Bed Mobility               General bed mobility comments: in chair    Transfers Overall transfer level: Needs assistance Equipment used: Rolling walker (2 wheeled) Transfers: Sit to/from Stand Sit to Stand: Supervision         General transfer comment: hands on knees, stood with relative ease and minimal momentum    Balance   Sitting-balance support: Feet supported;No upper extremity supported Sitting balance-Leahy Scale: Good     Standing balance support: During functional activity;Bilateral upper extremity supported;No upper extremity supported Standing balance-Leahy Scale: Poor                             ADL either performed or assessed with clinical judgement   ADL Overall ADL's : Needs assistance/impaired     Grooming: Wash/dry hands;Wash/dry  face;Sitting;Set up   Upper Body Bathing: Moderate assistance;Sitting Upper Body Bathing Details (indicate cue type and reason): assisted with back     Upper Body Dressing : Minimal assistance;Sitting           Toileting- Clothing Manipulation and Hygiene: Total assistance;Sit to/from stand         General ADL Comments: Fatigues in standing, reports her legs feel tired, but did not buckle.     Vision       Perception     Praxis      Cognition Arousal/Alertness: Awake/alert Behavior During Therapy: WFL for tasks assessed/performed Overall Cognitive Status: Within Functional Limits for tasks assessed                                          Exercises     Shoulder Instructions       General Comments      Pertinent Vitals/ Pain       Pain Assessment: Faces Faces Pain Scale: Hurts little more Pain Location: chest incision Pain Descriptors / Indicators: Discomfort Pain Intervention(s): Monitored during session;Repositioned  Home Living                                          Prior Functioning/Environment  Frequency  Min 2X/week        Progress Toward Goals  OT Goals(current goals can now be found in the care plan section)  Progress towards OT goals: Progressing toward goals  Acute Rehab OT Goals Patient Stated Goal: return to PLOF OT Goal Formulation: With patient Time For Goal Achievement: 01/17/21 Potential to Achieve Goals: Good  Plan Discharge plan remains appropriate    Co-evaluation                 AM-PAC OT "6 Clicks" Daily Activity     Outcome Measure   Help from another person eating meals?: None Help from another person taking care of personal grooming?: A Little Help from another person toileting, which includes using toliet, bedpan, or urinal?: Total Help from another person bathing (including washing, rinsing, drying)?: A Lot Help from another person to put on and  taking off regular upper body clothing?: A Little Help from another person to put on and taking off regular lower body clothing?: Total 6 Click Score: 14    End of Session Equipment Utilized During Treatment: Rolling walker  OT Visit Diagnosis: Unsteadiness on feet (R26.81);History of falling (Z91.81);Pain   Activity Tolerance Patient tolerated treatment well   Patient Left in chair;with call bell/phone within reach   Nurse Communication          Time: 0349-1791 OT Time Calculation (min): 24 min  Charges: OT General Charges $OT Visit: 1 Visit OT Treatments $Self Care/Home Management : 23-37 mins  Nestor Lewandowsky, OTR/L Acute Rehabilitation Services Pager: [phone] Office: [phone]   Malka So 01/07/2021, 11:53 AM

## 2021-01-08 DIAGNOSIS — I5033 Acute on chronic diastolic (congestive) heart failure: Secondary | ICD-10-CM | POA: Diagnosis not present

## 2021-01-08 LAB — RENAL FUNCTION PANEL
Albumin: 2.7 g/dL — ABNORMAL LOW (ref 3.5–5.0)
Anion gap: 9 (ref 5–15)
BUN: 58 mg/dL — ABNORMAL HIGH (ref 8–23)
CO2: 35 mmol/L — ABNORMAL HIGH (ref 22–32)
Calcium: 9.2 mg/dL (ref 8.9–10.3)
Chloride: 88 mmol/L — ABNORMAL LOW (ref 98–111)
Creatinine, Ser: 2.56 mg/dL — ABNORMAL HIGH (ref 0.44–1.00)
GFR, Estimated: 20 mL/min — ABNORMAL LOW (ref >60)
Glucose, Bld: 101 mg/dL — ABNORMAL HIGH (ref 70–99)
Phosphorus: 4.7 mg/dL — ABNORMAL HIGH (ref 2.5–4.6)
Potassium: 3.3 mmol/L — ABNORMAL LOW (ref 3.5–5.1)
Sodium: 132 mmol/L — ABNORMAL LOW (ref 135–145)

## 2021-01-08 LAB — CBC
HCT: 25.6 % — ABNORMAL LOW (ref 36.0–46.0)
Hemoglobin: 8.3 g/dL — ABNORMAL LOW (ref 12.0–15.0)
MCH: 28.5 pg (ref 26.0–34.0)
MCHC: 32.4 g/dL (ref 30.0–36.0)
MCV: 88 fL (ref 80.0–100.0)
Platelets: 294 K/uL (ref 150–400)
RBC: 2.91 MIL/uL — ABNORMAL LOW (ref 3.87–5.11)
RDW: 14.4 % (ref 11.5–15.5)
WBC: 9.4 K/uL (ref 4.0–10.5)
nRBC: 0 % (ref 0.0–0.2)

## 2021-01-08 LAB — COOXEMETRY PANEL
Carboxyhemoglobin: 1.4 % (ref 0.5–1.5)
Methemoglobin: 1 % (ref 0.0–1.5)
O2 Saturation: 58.6 %
Total hemoglobin: 8.7 g/dL — ABNORMAL LOW (ref 12.0–16.0)

## 2021-01-08 LAB — GLUCOSE, CAPILLARY
Glucose-Capillary: 104 mg/dL — ABNORMAL HIGH (ref 70–99)
Glucose-Capillary: 163 mg/dL — ABNORMAL HIGH (ref 70–99)
Glucose-Capillary: 123 mg/dL — ABNORMAL HIGH (ref 70–99)
Glucose-Capillary: 111 mg/dL — ABNORMAL HIGH (ref 70–99)

## 2021-01-08 LAB — MAGNESIUM: Magnesium: 1.7 mg/dL (ref 1.7–2.4)

## 2021-01-08 MED ORDER — MAGNESIUM SULFATE 2 GM/50ML IV SOLN
2.0000 g | Freq: Once | INTRAVENOUS | Status: AC
  Administered 2021-01-08: 2 g via INTRAVENOUS
  Filled 2021-01-08: qty 50

## 2021-01-08 MED ORDER — POTASSIUM CHLORIDE CRYS ER 20 MEQ PO TBCR
40.0000 meq | EXTENDED_RELEASE_TABLET | Freq: Once | ORAL | Status: AC
  Administered 2021-01-08: 40 meq via ORAL
  Filled 2021-01-08: qty 2

## 2021-01-08 NOTE — Progress Notes (Signed)
CARDIAC REHAB PHASE I   PRE:  Rate/Rhythm: 60 Afib  BP:  Supine:    Sitting:93/61   Standing:    SaO2: 97 RA  MODE:  Ambulation: 132 ft   POST:  Rate/Rhythm: 82 Afib  BP:  Supine: 120/88 Sitting:   Standing:    SaO2: 98 RA 1301-1345  Assisted to bathroom prior to walk.She came from bathroom and had to sit in chair in room prior to walking. She c/o of SOB. Assisted X 2 and used walker to ambulate. Gait steady with walker. Pt able to walk 132 feet without sitting. Pt to bed after walk with call light in reach.VS stable.  Rodney Langton RN 01/08/2021 1:41 PM

## 2021-01-08 NOTE — Progress Notes (Addendum)
Progress Note  Patient Name: [name] Date of Encounter: 01/08/2021  [facility] Cardiologist: Buford Dresser, MD   Subjective   SOB improving.   Inpatient Medications    Scheduled Meds: . (feeding supplement) PROSource Plus  30 mL Oral BID BM  . amiodarone  200 mg Oral BID  . apixaban  5 mg Oral BID  . bisacodyl  10 mg Oral Daily   Or  . bisacodyl  10 mg Rectal Daily  . Chlorhexidine Gluconate Cloth  6 each Topical Daily  . [place] Cardiac Surgery, Patient & Family Education   Does not apply Once  . docusate sodium  200 mg Oral Daily  . DULoxetine  60 mg Oral Daily  . insulin aspart  0-20 Units Subcutaneous TID WC  . insulin aspart  0-5 Units Subcutaneous QHS  . insulin aspart  3 Units Subcutaneous TID WC  . insulin detemir  30 Units Subcutaneous BID  . lactulose  20 g Oral Daily  . mouth rinse  15 mL Mouth Rinse BID  . multivitamin with minerals  1 tablet Oral Daily  . pantoprazole  40 mg Oral Daily  . potassium chloride  40 mEq Oral Daily  . potassium chloride  40 mEq Oral Once  . rosuvastatin  10 mg Oral Daily  . sodium chloride flush  3 mL Intravenous Q12H  . torsemide  60 mg Oral Daily   Continuous Infusions: . sodium chloride    . magnesium sulfate bolus IVPB    . promethazine (PHENERGAN) injection (IM or IVPB) 6.25 mg (01/03/21 0829)   PRN Meds: sodium chloride, dextrose, metoprolol tartrate, ondansetron (ZOFRAN) IV, oxyCODONE, promethazine (PHENERGAN) injection (IM or IVPB), sodium chloride flush, sodium chloride flush   Vital Signs    Vitals:   01/07/21 2342 01/08/21 0215 01/08/21 0318 01/08/21 0815  BP:   (!) 101/59 (!) 95/49  Pulse: 67  74 71  Resp: 15  17 (!) 22  Temp:   97.7 F (36.5 C) (!) 97.5 F (36.4 C)  TempSrc:   Oral Oral  SpO2: 96%  97% 96%  Weight:  128.6 kg    Height:        Intake/Output Summary (Last 24 hours) at 01/08/2021 1123 Last data filed at 01/08/2021 1100 Gross per 24 hour  Intake 669.05 ml   Output 1900 ml  Net -1230.95 ml   Last 3 Weights 01/08/2021 01/07/2021 01/06/2021  Weight (lbs) 283 lb 8.6 oz 282 lb 284 lb 6.3 oz  Weight (kg) 128.613 kg 127.914 kg 129 kg  Some encounter information is confidential and restricted. Go to Review Flowsheets activity to see all data.      Telemetry    Rate controlled afib- Personally Reviewed  ECG    n/a - Personally Reviewed  Physical Exam   GEN: No acute distress.   Neck: No JVD Cardiac: irreg  Respiratory: Clear to auscultation bilaterally. GI: Soft, nontender, non-distended  MS: 1+ bilateral LE edema Neuro:  Nonfocal  Psych: Normal affect   Labs    High Sensitivity Troponin:   Recent Labs  Lab 12/19/20 1016 12/19/20 1216 12/23/20 0450 12/23/20 0558  TROPONINIHS 8 12 6 6       Chemistry Recent Labs  Lab 01/02/21 0433 01/03/21 0419 01/04/21 0339 01/04/21 1452 01/06/21 0513 01/07/21 0432 01/08/21 0500  NA 129*   < > 131*   < > 134* 133* 132*  K 4.2   < > 3.2*   < > 3.6 3.2* 3.3*  CL 93*   < > 92*   < > 92* 91* 88*  CO2 24   < > 29   < > 34* 35* 35*  GLUCOSE 168*   < > 154*   < > 146* 109* 101*  BUN 56*   < > 66*   < > 62* 60* 58*  CREATININE 4.36*   < > 3.60*   < > 2.36* 2.38* 2.56*  CALCIUM 9.4   < > 9.1   < > 9.2 9.1 9.2  PROT 7.5  --  7.1  --   --   --   --   ALBUMIN 3.5  --  3.0*   < > 2.8* 2.7* 2.7*  AST 30  --  21  --   --   --   --   ALT <5  --  5  --   --   --   --   ALKPHOS 93  --  104  --   --   --   --   BILITOT 0.9  --  0.8  --   --   --   --   GFRNONAA 11*   < > 14*   < > 22* 22* 20*  ANIONGAP 12   < > 10   < > 8 7 9    < > = values in this interval not displayed.     Hematology Recent Labs  Lab 01/04/21 0339 01/05/21 0504 01/08/21 0439  WBC 10.1 10.1 9.4  RBC 2.70* 2.66* 2.91*  HGB 7.7* 7.7* 8.3*  HCT 23.8* 23.6* 25.6*  MCV 88.1 88.7 88.0  MCH 28.5 28.9 28.5  MCHC 32.4 32.6 32.4  RDW 14.4 14.5 14.4  PLT 200 226 294    BNPNo results for input(s): BNP, PROBNP in the last  168 hours.   DDimer No results for input(s): DDIMER in the last 168 hours.   Radiology    No results found.  Cardiac Studies     Patient Profile      Assessment & Plan    1. Valvular heart diseaes - s/p MV and TV repair 12/30/20 - Echo 01/02/21 LVEF 55% RV moderately HK. Moderate TR. No significant MR. No effusion  2. 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   - had been on milrionone, stopped. COOX this AM 59%.  Has been on torsemide 60mg  daily. - neg 632mL yesterday, neg 18.1 L since admission - continue oral torsemide.    3. AKI on CKD 3b - AKI postop, baseline Cr 1.6 to 1.8 - peak Cr 4.1, trending down today 2.38, mild uptrend to 2.5 today - followed by nephrology   4.Longstanding Persistent Atrial Fibrillations/p Maze - CHADSVASC 4+  - ASA 324 per TCTS - had been on amio gtt, changed to oral 200mg  bid -off digoxin with AKI and EF 55%.  -On eliquis 5 mg twice a day.  - from notes to consider possible DCCV as outpatient 4-8 weeks    For questions or updates, please contact [place] Please consult [email or web address] for contact info under        Signed, Carlyle Dolly, MD  01/08/2021, 11:23 AM

## 2021-01-08 NOTE — Progress Notes (Signed)
Mobility Specialist: Progress Note   01/08/21 1629  Mobility  Activity Ambulated to bathroom;Ambulated in hall  Level of Assistance Minimal assist, patient does 75% or more  Assistive Device Front wheel walker  Distance Ambulated (ft) 120 ft  Mobility Ambulated with assistance in hallway  Mobility Response Tolerated well  Mobility performed by Mobility specialist  $Mobility charge 1 Mobility   Pre-Mobility: 69 HR, 96% SpO2 During Mobility: 69 HR, 94% SpO2 Post-Mobility: 71 HR, 93% SpO2  Pt to BR and then agreeable to ambulation. Pt took a short seated break due to 8/10 LE pain and feeling SOB halfway through ambulation. Pt to bed after walk and requesting pain medication, RN present in room.   [facility] Cedrick Partain Mobility Specialist Mobility Specialist Phone: [phone]

## 2021-01-08 NOTE — Plan of Care (Signed)
  Problem: Clinical Measurements: Goal: Will remain free from infection Outcome: Progressing Goal: Respiratory complications will improve Outcome: Progressing Goal: Cardiovascular complication will be avoided Outcome: Progressing   

## 2021-01-08 NOTE — Progress Notes (Addendum)
[name] [address]       [place] [address]             [phone]      8 Days Post-Op Procedure(s) (LRB): MITRAL VALVE REPAIR USING CARBOMEDICS ANNULOFLEX RING SIZE 30 (N/A) 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 RING SIZE 34 (N/A) MAZE (N/A) TRANSESOPHAGEAL ECHOCARDIOGRAM (TEE) (N/A)   Subjective:  Awake and alert, no new concerns. Made some progress with ambulation yesterday  No new events past 24 hr.   Amiodarone infusion and milrinone were discontinued yesterday.   CoOx 58.6    Objective: Vital signs in last 24 hours: Temp:  [97.7 F (36.5 C)-97.8 F (36.6 C)] 97.7 F (36.5 C) (05/21 0318) Pulse Rate:  [63-76] 74 (05/21 0318) Cardiac Rhythm: Atrial fibrillation (05/20 1900) Resp:  [15-20] 17 (05/21 0318) BP: (101-127)/(56-76) 101/59 (05/21 0318) SpO2:  [96 %-100 %] 97 % (05/21 0318) Weight:  [128.6 kg] 128.6 kg (05/21 0215)  Hemodynamic parameters for last 24 hours: CVP:  [8 mmHg-11 mmHg] 11 mmHg  Intake/Output from previous day: 05/20 0701 - 05/21 0700 In: 429.1 [P.O.:240; I.V.:132.2; IV Piggyback:56.9] Out: 1100 [Urine:1100] Intake/Output this shift: No intake/output data recorded.  General appearance: alert, cooperative and no distress Heart: irregularly irregular rhythm, monitor showing a-fib with rates  50's- 70. Lungs: clear, diminished bilaterally Abdomen: soft, non-tender; bowel sounds normal Extremities: una boots in place BLE Wound: clean and dry  Lab Results: Recent Labs    01/08/21 0439  WBC 9.4  HGB 8.3*  HCT 25.6*  PLT 294   BMET:  Recent Labs    01/07/21 0432 01/08/21 0500  NA 133* 132*  K 3.2* 3.3*  CL 91* 88*  CO2 35* 35*  GLUCOSE 109* 101*  BUN 60* 58*  CREATININE 2.38* 2.56*  CALCIUM 9.1 9.2    PT/INR: No results for input(s): LABPROT, INR in the last 72 hours. ABG    Component Value Date/Time   PHART 7.368 12/30/2020 0050   HCO3 23.0 12/30/2020 0050   TCO2 24 12/30/2020 0050    ACIDBASEDEF 2.0 12/30/2020 0050   O2SAT 58.6 01/08/2021 0524   CBG (last 3)  Recent Labs    01/07/21 1612 01/07/21 2041 01/08/21 0614  GLUCAP 128* 122* 104*    Assessment/Plan: S/P Procedure(s) (LRB): MITRAL VALVE REPAIR USING CARBOMEDICS ANNULOFLEX RING SIZE 30 (N/A) 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 RING SIZE 34 (N/A) MAZE (N/A) TRANSESOPHAGEAL ECHOCARDIOGRAM (TEE) (N/A)  -POD10 mitral valve repair and tricuspid valve repair and MAZE presenting with acute on chronic heart failure and persistent atrial fibrillation. Milrinone off since yesterday, CoOx stable.  She is progressing slowly with mobility.   -Atrial fibrillation- this is chronic. S/P MAZE.  Rate is controlled and she is on Eliquis and oral amiodarone. K+ 3.3, Mg++ 1.7, will supplement both today.   -Type 2 DM- Currently on BID Levemir 30 units and Novalog CC along with SSI.  Glucose control adequate past 24 hours.  She has poor appetite and minimal oral intake.   -Expected acute blood loss anemia- Hct trending up. Con't Trinsicon  -Acute on chronic renal insufficiency- Cr peaked at 4.1, appears to have levelled off at ~2.4. and she is diuresing. Monitor.      LOS: 18 days    Malon Kindle  [phone] 01/08/2021   Chart reviewed, patient examined, agree with above. Co-ox 58.6 off milrinone, stable Creatinine fairly stable but up slightly today. On Demedex 60/day. Repeat creat  in am. She needs to eat and continue mobilization.

## 2021-01-09 ENCOUNTER — Inpatient Hospital Stay (HOSPITAL_COMMUNITY): Payer: Medicare Other

## 2021-01-09 DIAGNOSIS — I4821 Permanent atrial fibrillation: Secondary | ICD-10-CM | POA: Diagnosis not present

## 2021-01-09 LAB — GLUCOSE, CAPILLARY
Glucose-Capillary: 117 mg/dL — ABNORMAL HIGH (ref 70–99)
Glucose-Capillary: 123 mg/dL — ABNORMAL HIGH (ref 70–99)
Glucose-Capillary: 97 mg/dL (ref 70–99)
Glucose-Capillary: 127 mg/dL — ABNORMAL HIGH (ref 70–99)

## 2021-01-09 LAB — RENAL FUNCTION PANEL
Albumin: 2.7 g/dL — ABNORMAL LOW (ref 3.5–5.0)
Anion gap: 9 (ref 5–15)
BUN: 58 mg/dL — ABNORMAL HIGH (ref 8–23)
CO2: 34 mmol/L — ABNORMAL HIGH (ref 22–32)
Calcium: 9.2 mg/dL (ref 8.9–10.3)
Chloride: 92 mmol/L — ABNORMAL LOW (ref 98–111)
Creatinine, Ser: 2.55 mg/dL — ABNORMAL HIGH (ref 0.44–1.00)
GFR, Estimated: 20 mL/min — ABNORMAL LOW (ref >60)
Glucose, Bld: 98 mg/dL (ref 70–99)
Phosphorus: 4.6 mg/dL (ref 2.5–4.6)
Potassium: 3.5 mmol/L (ref 3.5–5.1)
Sodium: 135 mmol/L (ref 135–145)

## 2021-01-09 LAB — COOXEMETRY PANEL
Carboxyhemoglobin: 1.8 % — ABNORMAL HIGH (ref 0.5–1.5)
Methemoglobin: 0.9 % (ref 0.0–1.5)
O2 Saturation: 77.5 %
Total hemoglobin: 9.2 g/dL — ABNORMAL LOW (ref 12.0–16.0)

## 2021-01-09 NOTE — Plan of Care (Signed)
Progressing, will continue to monitor.  

## 2021-01-09 NOTE — Progress Notes (Addendum)
[address]       [place] [address]             [phone]      8 Days Post-Op Procedure(s) (LRB): MITRAL VALVE REPAIR USING CARBOMEDICS ANNULOFLEX RING SIZE 30 (N/A) 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 RING SIZE 34 (N/A) MAZE (N/A) TRANSESOPHAGEAL ECHOCARDIOGRAM (TEE) (N/A)   Subjective:  Awake and alert, no new concerns. Had a lot of fatigue and shortness of breath with ambulation yesterday. Sats OK at rest.   CoOx 77 today.   Objective: Vital signs in last 24 hours: Temp:  [97.3 F (36.3 C)-98.4 F (36.9 C)] 97.3 F (36.3 C) (05/22 0755) Pulse Rate:  [60-74] 65 (05/22 0755) Cardiac Rhythm: Atrial fibrillation (05/22 0749) Resp:  [16-22] 22 (05/22 0755) BP: (98-123)/(43-91) 123/91 (05/22 0755) SpO2:  [96 %-100 %] 100 % (05/22 0755) Weight:  [127.4 kg] 127.4 kg (05/22 0417)  Hemodynamic parameters for last 24 hours: CVP:  [10 mmHg] 10 mmHg  Intake/Output from previous day: 05/21 0701 - 05/22 0700 In: 820 [P.O.:720; IV Piggyback:100] Out: 1300 [Urine:1300] Intake/Output this shift: No intake/output data recorded.  General appearance: alert, cooperative and no distress Heart: irregularly irregular rhythm, monitor showing a-fib with rates  50's- 70. Lungs: clear, diminished bilaterally Abdomen: soft, non-tender; bowel sounds normal Extremities: una boots in place BLE, edema receding slowly.  Wound: clean and dry  Lab Results: Recent Labs    01/08/21 0439  WBC 9.4  HGB 8.3*  HCT 25.6*  PLT 294   BMET:  Recent Labs    01/08/21 0500 01/09/21 0504  NA 132* 135  K 3.3* 3.5  CL 88* 92*  CO2 35* 34*  GLUCOSE 101* 98  BUN 58* 58*  CREATININE 2.56* 2.55*  CALCIUM 9.2 9.2    PT/INR: No results for input(s): LABPROT, INR in the last 72 hours. ABG    Component Value Date/Time   PHART 7.368 12/30/2020 0050   HCO3 23.0 12/30/2020 0050   TCO2 24 12/30/2020 0050   ACIDBASEDEF 2.0 12/30/2020 0050   O2SAT 77.5  01/09/2021 0500   CBG (last 3)  Recent Labs    01/08/21 1649 01/08/21 2132 01/09/21 0609  GLUCAP 123* 111* 117*    Assessment/Plan: S/P Procedure(s) (LRB): MITRAL VALVE REPAIR USING CARBOMEDICS ANNULOFLEX RING SIZE 30 (N/A) 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 RING SIZE 34 (N/A) MAZE (N/A) TRANSESOPHAGEAL ECHOCARDIOGRAM (TEE) (N/A)  -POD11 mitral valve repair and tricuspid valve repair and MAZE presenting with acute on chronic heart failure and persistent atrial fibrillation. CoOx stable off milrinone for 48 hrs.  She is progressing slowly with mobility.   -Atrial fibrillation- this is chronic. S/P MAZE.  Rate is controlled and she is on Eliquis and oral amiodarone. Cardiology planning DC cardioversion in 4-6 wks if a-fib persists.   -Type 2 DM- Currently on BID Levemir 30 units and Novalog CC along with SSI.  Glucose control adequate past 24 hours.  Appetite has improved  -Expected acute blood loss anemia- Hct stable. Con't Trinsicon  -Acute on chronic renal insufficiency- Cr peaked at 4.1, appears stable at ~2.5. and she is diuresing. Monitor.   -Continue to mobilize as able.      LOS: 19 days    Malon Kindle  [phone] 01/09/2021  Chart reviewed, patient examined, agree with above. She is doing well overall considering. Co-ox 77 off milrinone. Still difficult ambulation due to SOB and fatigue. CXR stable with cardiomegaly  but no effusion, mild left base atelectasis.

## 2021-01-09 NOTE — Progress Notes (Signed)
Mobility Specialist: Progress Note   01/09/21 1527  Mobility  Activity Ambulated in hall  Level of Assistance Contact guard assist, steadying assist  Assistive Device Front wheel walker  Distance Ambulated (ft) 250 ft  Mobility Ambulated with assistance in hallway  Mobility Response Tolerated well  Mobility performed by Mobility specialist  Bed Position Chair  $Mobility charge 1 Mobility   Pre-Mobility: 70 HR, 92% SpO2 Post-Mobility: 82 HR, 162/72 BP, 92% SpO2  Pt c/o chest pain during ambulation, no rating given, as well as SOB. Pt took two seated breaks due these lasting 1-2 minutes each (pulled rollator behind pt). Pt to chair after walk with call bell at her side.   [facility] Jernie Schutt Mobility Specialist Mobility Specialist Phone: [phone]

## 2021-01-09 NOTE — Progress Notes (Signed)
Progress Note  Patient Name: [name] Date of Encounter: 01/09/2021  [facility] Cardiologist: Buford Dresser, MD   Subjective   Some dyspnea walking with rehab yesterday  Inpatient Medications    Scheduled Meds: . (feeding supplement) PROSource Plus  30 mL Oral BID BM  . amiodarone  200 mg Oral BID  . apixaban  5 mg Oral BID  . bisacodyl  10 mg Oral Daily   Or  . bisacodyl  10 mg Rectal Daily  . Chlorhexidine Gluconate Cloth  6 each Topical Daily  . [facility]   Does not apply Once  . docusate sodium  200 mg Oral Daily  . DULoxetine  60 mg Oral Daily  . insulin aspart  0-20 Units Subcutaneous TID WC  . insulin aspart  0-5 Units Subcutaneous QHS  . insulin aspart  3 Units Subcutaneous TID WC  . insulin detemir  30 Units Subcutaneous BID  . lactulose  20 g Oral Daily  . mouth rinse  15 mL Mouth Rinse BID  . multivitamin with minerals  1 tablet Oral Daily  . pantoprazole  40 mg Oral Daily  . potassium chloride  40 mEq Oral Daily  . rosuvastatin  10 mg Oral Daily  . sodium chloride flush  3 mL Intravenous Q12H  . torsemide  60 mg Oral Daily   Continuous Infusions: . sodium chloride    . promethazine (PHENERGAN) injection (IM or IVPB) 6.25 mg (01/03/21 0829)   PRN Meds: sodium chloride, dextrose, metoprolol tartrate, ondansetron (ZOFRAN) IV, oxyCODONE, promethazine (PHENERGAN) injection (IM or IVPB), sodium chloride flush, sodium chloride flush   Vital Signs    Vitals:   01/08/21 2331 01/09/21 0011 01/09/21 0417 01/09/21 0755  BP: (!) 98/49  (!) 98/43 (!) 123/91  Pulse: 65 74 69 65  Resp: 17  18 (!) 22  Temp: 98.4 F (36.9 C)  98.3 F (36.8 C) (!) 97.3 F (36.3 C)  TempSrc: Oral  Oral Oral  SpO2: 100%  100% 100%  Weight:   127.4 kg   Height:        Intake/Output Summary (Last 24 hours) at 01/09/2021 1012 Last data filed at 01/09/2021 0437 Gross per 24 hour  Intake 820 ml  Output 1300 ml   Net -480 ml   Last 3 Weights 01/09/2021 01/08/2021 01/07/2021  Weight (lbs) 280 lb 12.8 oz 283 lb 8.6 oz 282 lb  Weight (kg) 127.37 kg 128.613 kg 127.914 kg  Some encounter information is confidential and restricted. Go to Review Flowsheets activity to see all data.      Telemetry    Rate controlled afib - Personally Reviewed  ECG    n/a - Personally Reviewed  Physical Exam   GEN: No acute distress.   Neck: No JVD Cardiac: irreg Respiratory: faint crackles bilaterla bases GI: Soft, nontender, non-distended  MS: No edema; No deformity. Neuro:  Nonfocal  Psych: Normal affect   Labs    High Sensitivity Troponin:   Recent Labs  Lab 12/19/20 1016 12/19/20 1216 12/23/20 0450 12/23/20 0558  TROPONINIHS 8 12 6 6       Chemistry Recent Labs  Lab 01/04/21 0339 01/04/21 1452 01/07/21 0432 01/08/21 0500 01/09/21 0504  NA 131*   < > 133* 132* 135  K 3.2*   < > 3.2* 3.3* 3.5  CL 92*   < > 91* 88* 92*  CO2 29   < > 35* 35* 34*  GLUCOSE 154*   < >  109* 101* 98  BUN 66*   < > 60* 58* 58*  CREATININE 3.60*   < > 2.38* 2.56* 2.55*  CALCIUM 9.1   < > 9.1 9.2 9.2  PROT 7.1  --   --   --   --   ALBUMIN 3.0*   < > 2.7* 2.7* 2.7*  AST 21  --   --   --   --   ALT 5  --   --   --   --   ALKPHOS 104  --   --   --   --   BILITOT 0.8  --   --   --   --   GFRNONAA 14*   < > 22* 20* 20*  ANIONGAP 10   < > 7 9 9    < > = values in this interval not displayed.     Hematology Recent Labs  Lab 01/04/21 0339 01/05/21 0504 01/08/21 0439  WBC 10.1 10.1 9.4  RBC 2.70* 2.66* 2.91*  HGB 7.7* 7.7* 8.3*  HCT 23.8* 23.6* 25.6*  MCV 88.1 88.7 88.0  MCH 28.5 28.9 28.5  MCHC 32.4 32.6 32.4  RDW 14.4 14.5 14.4  PLT 200 226 294    BNPNo results for input(s): BNP, PROBNP in the last 168 hours.   DDimer No results for input(s): DDIMER in the last 168 hours.   Radiology    DG CHEST PORT 1 VIEW  Result Date: 01/09/2021 CLINICAL DATA:  Mitral valve repair on 12/29/2020. Patient with  shortness of breath. Previous pneumothorax and pleural effusion. EXAM: PORTABLE CHEST 1 VIEW COMPARISON:  01/05/2021 and older exams. FINDINGS: Stable enlargement of the cardiopericardial silhouette and changes from mitral and tricuspid valve replacement. No mediastinal or hilar masses. No mediastinal widening. Linear opacities at the left lung base are stable consistent with atelectasis. Remainder of the lungs is clear. No convincing pleural effusion.  No pneumothorax. Stable right-sided PICC. IMPRESSION: 1. No acute findings or evidence of an operative complication. 2. No pneumothorax.  No visualized pleural effusion. Electronically Signed   By: Lajean Manes M.D.   On: 01/09/2021 10:07    Cardiac Studies     Patient Profile      Assessment & Plan    1. Valvular heart diseaes - s/p MV and TV repair 12/30/20 - Echo 01/02/21 LVEF 55% RV moderately HK. Moderate TR. No significant MR. No effusion  2. 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   - had been on milrionone, stopped. COOX 59% yesterday off milrinone, 77% today. D/c coox labs. -  Has been on torsemide 60mg  daily, neg 437mL yesterday, neg 16 L since admission. AKI that has been improving, though Cr stable at 2.5 last few days - continue oral torsemide    3. AKI on CKD 3b - AKI postop, baseline Cr 1.6 to 1.8 - peak Cr 4.1, trending down and plateaud at 2.5 last 2 days - followed by nephrology - continue to follow labs   4.Longstanding Persistent Atrial Fibrillations/p Maze - CHADSVASC 4+  - had been on amio gtt, changed to oral 200mg  bid -off digoxin with AKIand EF 55%. -On eliquis 5 mg twice a day.  - from notes to consider possible DCCV as outpatient 4-8 weeks  For questions or updates, please contact [place] Please consult [email or web address] for contact info under         Signed,  Carlyle Dolly, MD  01/09/2021, 10:12 AM

## 2021-01-10 DIAGNOSIS — I5023 Acute on chronic systolic (congestive) heart failure: Secondary | ICD-10-CM | POA: Diagnosis not present

## 2021-01-10 LAB — GLUCOSE, CAPILLARY
Glucose-Capillary: 120 mg/dL — ABNORMAL HIGH (ref 70–99)
Glucose-Capillary: 106 mg/dL — ABNORMAL HIGH (ref 70–99)
Glucose-Capillary: 152 mg/dL — ABNORMAL HIGH (ref 70–99)
Glucose-Capillary: 109 mg/dL — ABNORMAL HIGH (ref 70–99)

## 2021-01-10 LAB — BASIC METABOLIC PANEL
Anion gap: 8 (ref 5–15)
BUN: 60 mg/dL — ABNORMAL HIGH (ref 8–23)
CO2: 36 mmol/L — ABNORMAL HIGH (ref 22–32)
Calcium: 9.2 mg/dL (ref 8.9–10.3)
Chloride: 93 mmol/L — ABNORMAL LOW (ref 98–111)
Creatinine, Ser: 2.72 mg/dL — ABNORMAL HIGH (ref 0.44–1.00)
GFR, Estimated: 19 mL/min — ABNORMAL LOW (ref >60)
Glucose, Bld: 107 mg/dL — ABNORMAL HIGH (ref 70–99)
Potassium: 3.3 mmol/L — ABNORMAL LOW (ref 3.5–5.1)
Sodium: 137 mmol/L (ref 135–145)

## 2021-01-10 LAB — CBC
HCT: 26 % — ABNORMAL LOW (ref 36.0–46.0)
Hemoglobin: 8.1 g/dL — ABNORMAL LOW (ref 12.0–15.0)
MCH: 27.8 pg (ref 26.0–34.0)
MCHC: 31.2 g/dL (ref 30.0–36.0)
MCV: 89.3 fL (ref 80.0–100.0)
Platelets: 331 10*3/uL (ref 150–400)
RBC: 2.91 MIL/uL — ABNORMAL LOW (ref 3.87–5.11)
RDW: 14.6 % (ref 11.5–15.5)
WBC: 7.7 10*3/uL (ref 4.0–10.5)
nRBC: 0 % (ref 0.0–0.2)

## 2021-01-10 MED ORDER — ACETAZOLAMIDE 250 MG PO TABS
250.0000 mg | ORAL_TABLET | Freq: Two times a day (BID) | ORAL | Status: AC
  Administered 2021-01-10 – 2021-01-11 (×4): 250 mg via ORAL
  Filled 2021-01-10 (×4): qty 1

## 2021-01-10 MED ORDER — POTASSIUM CHLORIDE CRYS ER 20 MEQ PO TBCR
40.0000 meq | EXTENDED_RELEASE_TABLET | Freq: Two times a day (BID) | ORAL | Status: AC
  Administered 2021-01-10 – 2021-01-11 (×4): 40 meq via ORAL
  Filled 2021-01-10 (×4): qty 2

## 2021-01-10 NOTE — Progress Notes (Signed)
Mobility Specialist: Progress Note   01/10/21 1606  Mobility  Activity Ambulated in hall  Level of Assistance Standby assist, set-up cues, supervision of patient - no hands on  Assistive Device Front wheel walker  Distance Ambulated (ft) 250 ft  Mobility Ambulated with assistance in hallway  Mobility Response Tolerated well  Mobility performed by Mobility specialist  Bed Position Chair  $Mobility charge 1 Mobility   Pre-Mobility: 70 HR, 98% SpO2 During Mobility: 72 HR, 97% SpO2 Post-Mobility: 85 HR, 121/73 BP, 97% SpO2  Pt took three seated breaks during ambulation due to c/o 8/10 chest and back pain as well as feeling SOB (rollator used for chair follow). Pt to BR after walk per request and then back to the recliner, call bell at her side.   [facility] Brick Ketcher Mobility Specialist Mobility Specialist Phone: [phone]

## 2021-01-10 NOTE — Discharge Instructions (Signed)
Discharge Instructions:  1. You may shower, please wash incisions daily with soap and water and keep dry.  If you wish to cover wounds with dressing you may do so but please keep clean and change daily.  No tub baths or swimming until incisions have completely healed.  If your incisions become red or develop any drainage please call our office at [phone]  2. No Driving until cleared by Dr. Hendrickson's office and you are no longer using narcotic pain medications  3. Monitor your weight daily.. Please use the same scale and weigh at same time... If you gain 5-10 lbs in 48 hours with associated lower extremity swelling, please contact our office at [phone]  4. Fever of 101.5 for at least 24 hours with no source, please contact our office at [phone]  5. Activity- up as tolerated, please walk at least 3 times per day.  Avoid strenuous activity, no lifting, pushing, or pulling with your arms over 8-10 lbs for a minimum of 6 weeks  6. If any questions or concerns arise, please do not hesitate to contact our office at [phone]  

## 2021-01-10 NOTE — Plan of Care (Signed)
  Problem: Education: Goal: Ability to demonstrate management of disease process will improve Outcome: Progressing Goal: Ability to verbalize understanding of medication therapies will improve Outcome: Progressing Goal: Individualized Educational Video(s) Outcome: Progressing   Problem: Activity: Goal: Capacity to carry out activities will improve Outcome: Progressing   Problem: Cardiac: Goal: Ability to achieve and maintain adequate cardiopulmonary perfusion will improve Outcome: Progressing   Problem: Education: Goal: Knowledge of General Education information will improve Description: Including pain rating scale, medication(s)/side effects and non-pharmacologic comfort measures Outcome: Progressing   Problem: Health Behavior/Discharge Planning: Goal: Ability to manage health-related needs will improve Outcome: Progressing   Problem: Clinical Measurements: Goal: Ability to maintain clinical measurements within normal limits will improve Outcome: Progressing Goal: Will remain free from infection Outcome: Progressing Goal: Diagnostic test results will improve Outcome: Progressing Goal: Respiratory complications will improve Outcome: Progressing Goal: Cardiovascular complication will be avoided Outcome: Progressing   Problem: Activity: Goal: Risk for activity intolerance will decrease Outcome: Progressing   Problem: Nutrition: Goal: Adequate nutrition will be maintained Outcome: Progressing   Problem: Coping: Goal: Level of anxiety will decrease Outcome: Progressing   Problem: Elimination: Goal: Will not experience complications related to bowel motility Outcome: Progressing Goal: Will not experience complications related to urinary retention Outcome: Progressing   Problem: Pain Managment: Goal: General experience of comfort will improve Outcome: Progressing   Problem: Safety: Goal: Ability to remain free from injury will improve Outcome: Progressing    Problem: Skin Integrity: Goal: Risk for impaired skin integrity will decrease Outcome: Progressing   Problem: Education: Goal: Will demonstrate proper wound care and an understanding of methods to prevent future damage Outcome: Progressing Goal: Knowledge of disease or condition will improve Outcome: Progressing Goal: Knowledge of the prescribed therapeutic regimen will improve Outcome: Progressing Goal: Individualized Educational Video(s) Outcome: Progressing   Problem: Activity: Goal: Risk for activity intolerance will decrease Outcome: Progressing   Problem: Cardiac: Goal: Will achieve and/or maintain hemodynamic stability Outcome: Progressing   Problem: Clinical Measurements: Goal: Postoperative complications will be avoided or minimized Outcome: Progressing   Problem: Respiratory: Goal: Respiratory status will improve Outcome: Progressing   Problem: Skin Integrity: Goal: Wound healing without signs and symptoms of infection Outcome: Progressing Goal: Risk for impaired skin integrity will decrease Outcome: Progressing   Problem: Urinary Elimination: Goal: Ability to achieve and maintain adequate renal perfusion and functioning will improve Outcome: Progressing

## 2021-01-10 NOTE — Progress Notes (Signed)
Physical Therapy Treatment Patient Details Name: [name] MRN: [id number] DOB: 1956-05-14 Today's Date: 01/10/2021    History of Present Illness Patient is a 65 y/o female admitted on 12/19/20 with SOB, chest pain, N/V and emesis. Admitted with CHF exacerbation. s/p MVR,TVR and Maze 12/29/20. PMHx: HTN. DM2, A-fib, CHF, OSA, DVT, COPD.    PT Comments    Pt received in supine, agreeable to therapy session despite c/o severe incisional chest pain with bed mobility (RN notified, pt requesting chest binder if possible for sxs) and good participation in bed mobility, transfer and stair training in room. Pt needing +2 minA for safety with stair ascent/descent but no overt buckling observed x5 reps. Pt needs reminders for hand placement/technique with bed mobility but not with standing transfers which she performed with Supervision only for safety. Pt continues to benefit from PT services to progress toward functional mobility goals. Continue to recommend HHPT.  Follow Up Recommendations  Home health PT;Supervision - Intermittent     Equipment Recommendations  Other (comment) (bari RW (vs rollator, continue to assess))    Recommendations for Other Services       Precautions / Restrictions Precautions Precautions: Fall;Sternal Precaution Booklet Issued: Yes (comment) Precaution Comments: knees buckle Restrictions Weight Bearing Restrictions: No Other Position/Activity Restrictions: sternal precautions    Mobility  Bed Mobility Overal bed mobility: Needs Assistance Bed Mobility: Rolling;Sidelying to Sit Rolling: Modified independent (Device/Increase time) Sidelying to sit: Min guard       General bed mobility comments: cues for log rolling, needs reminder not to use bottom elbow to push outward when raising trunk.    Transfers Overall transfer level: Needs assistance Equipment used: Rolling walker (2 wheeled) Transfers: Sit to/from Stand Sit to Stand: Supervision          General transfer comment: from EOB and chair heights, able to stand without UE support  Ambulation/Gait Ambulation/Gait assistance: Min guard Gait Distance (Feet): 10 Feet (x2 with seated break between) Assistive device: Rolling walker (2 wheeled) Gait Pattern/deviations: Step-through pattern;Decreased stance time - left;Decreased stride length;Decreased stance time - right Gait velocity: decreased   General Gait Details: slow, but steady, mild waddle. HR 68-77 bpm with exertion; SpO2 WNL on RA, distance limited due to pt fatigue after performing stair trial in room x2 reps   Stairs Stairs: Yes Stairs assistance: Min assist;+2 safety/equipment Stair Management: Step to pattern;Forwards;With walker Number of Stairs: 5 (2 steps, seated break, then 3 more steps) General stair comments: pt ascended/descended single 7" step in room x5 total reps with seated break after first two, no LOB, +2 for safety but no buckling observed.   Wheelchair Mobility    Modified Rankin (Stroke Patients Only)       Balance Overall balance assessment: Needs assistance Sitting-balance support: Feet supported;No upper extremity supported Sitting balance-Leahy Scale: Good     Standing balance support: During functional activity;Bilateral upper extremity supported;No upper extremity supported Standing balance-Leahy Scale: Fair Standing balance comment: able to stand with U UE support for static standing but reliant on BUE support during gait/stairs                            Cognition Arousal/Alertness: Awake/alert Behavior During Therapy: WFL for tasks assessed/performed Overall Cognitive Status: Impaired/Different from baseline Area of Impairment: Problem solving;Memory                     Memory: Decreased recall of precautions;Decreased short-term memory  Problem Solving: Requires verbal cues General Comments: pt following commands with increased time,  tangential at times needs to be redirected to task but pleasantly participatory.      Exercises Other Exercises Other Exercises: seated BLE AROM: ankle pumps, LAQ x10 reps ea    General Comments General comments (skin integrity, edema, etc.): SpO2 100% on RA, HR 68-77 bpm with exertion, BP 148/95 (112) seated in chair, no dizziness reported with transfers.      Pertinent Vitals/Pain Pain Assessment: 0-10 Pain Score: 8  Pain Location: chest incision during transfers, decreased at rest Pain Descriptors / Indicators: Discomfort;Sore;Constant Pain Intervention(s): Monitored during session;Repositioned;Patient requesting pain meds-RN notified    Home Living                      Prior Function            PT Goals (current goals can now be found in the care plan section) Acute Rehab PT Goals Patient Stated Goal: return to PLOF PT Goal Formulation: With patient Time For Goal Achievement: 01/21/21 Potential to Achieve Goals: Good Progress towards PT goals: Progressing toward goals    Frequency    Min 3X/week      PT Plan Current plan remains appropriate    AM-PAC PT "6 Clicks" Mobility   Outcome Measure  Help needed turning from your back to your side while in a flat bed without using bedrails?: None Help needed moving from lying on your back to sitting on the side of a flat bed without using bedrails?: A Little Help needed moving to and from a bed to a chair (including a wheelchair)?: A Little Help needed standing up from a chair using your arms (e.g., wheelchair or bedside chair)?: A Little Help needed to walk in hospital room?: A Little Help needed climbing 3-5 steps with a railing? : A Little 6 Click Score: 19    End of Session Equipment Utilized During Treatment: Gait belt Activity Tolerance: Patient tolerated treatment well Patient left: in chair;with call bell/phone within reach (reclined in chair, pt able to state how to use call bell and OK to leave  chair alarm off per RN.) Nurse Communication: Mobility status;Other (comment) (pt asking about chest binder for incisional pain) PT Visit Diagnosis: Other abnormalities of gait and mobility (R26.89);Muscle weakness (generalized) (M62.81);Pain Pain - part of body:  (chest inicision)     Time: 9021-1155 PT Time Calculation (min) (ACUTE ONLY): 27 min  Charges:  $Gait Training: 8-22 mins $Therapeutic Activity: 8-22 mins                     Russel Morain P., PTA Acute Rehabilitation Services Pager: [phone] Office: [place] 01/10/2021, 4:10 PM

## 2021-01-10 NOTE — Progress Notes (Signed)
Occupational Therapy Treatment Patient Details Name: [name] MRN: [id number] DOB: [birth year] Today's Date: 01/10/2021    History of present illness Patient is a 65 y/o female admitted on 12/19/20 with SOB, chest pain, N/V and emesis. Admitted with CHF exacerbation. s/p MVR,TVR and Maze 12/29/20. PMHx: HTN. DM2, A-fib, CHF, OSA, DVT, COPD.   OT comments  Patient seated in recliner upon entry, just finished with Cardiac Rehab.  Agreeable to ADL engagement, initiated education on LB AE with long sponge, reacher and sock aide. Requires min assist to manage donning/doffing socks with reacher and sock aide due to unna boots but demonstrates understanding with use of equipment.  Reviewed sternal precautions and patient requires cueing to recall precautions.  Will require cueing AE education next session.  Will follow acutely.    Follow Up Recommendations  Home health OT;Supervision/Assistance - 24 hour    Equipment Recommendations  Tub/shower bench;Other (comment) (AE)    Recommendations for Other Services      Precautions / Restrictions Precautions Precautions: Fall;Sternal Precaution Comments: knees buckle Restrictions Weight Bearing Restrictions: No Other Position/Activity Restrictions: sternal precautions       Mobility Bed Mobility               General bed mobility comments: OOB in recliner upon entry    Transfers                 General transfer comment: deferred, just returned from walk with cardiac rehab and pt declined    Balance Overall balance assessment: Needs assistance Sitting-balance support: Feet supported;No upper extremity supported Sitting balance-Leahy Scale: Good                                     ADL either performed or assessed with clinical judgement   ADL Overall ADL's : Needs assistance/impaired             Lower Body Bathing: Moderate assistance;Sitting/lateral leans;With adaptive equipment Lower Body  Bathing Details (indicate cue type and reason): demonstrated use of long sponge for LB bathing in sitting     Lower Body Dressing: Moderate assistance;With adaptive equipment;Cueing for compensatory techniques;Sitting/lateral leans Lower Body Dressing Details (indicate cue type and reason): initated education with LB AE, reviewed use of reacher and sock aide-- requires min assist to doff/don socks with AE due to unna boots       Toileting - Clothing Manipulation Details (indicate cue type and reason): discussed techniques for toileting due to sternal precautions, reports having to have assist currently       General ADL Comments: initated education with LB AE     Vision       Perception     Praxis      Cognition Arousal/Alertness: Awake/alert Behavior During Therapy: WFL for tasks assessed/performed Overall Cognitive Status: Impaired/Different from baseline Area of Impairment: Problem solving;Memory                     Memory: Decreased recall of precautions;Decreased short-term memory       Problem Solving: Requires verbal cues General Comments: pt following commands with increased time, poor recall of sternal precautions and required cueing        Exercises     Shoulder Instructions       General Comments VSS    Pertinent Vitals/ Pain       Pain Assessment: Faces Faces Pain Scale: Hurts little more  Pain Location: chest incision Pain Descriptors / Indicators: Discomfort Pain Intervention(s): Limited activity within patient's tolerance;Monitored during session;Repositioned  Home Living                                          Prior Functioning/Environment              Frequency  Min 2X/week        Progress Toward Goals  OT Goals(current goals can now be found in the care plan section)  Progress towards OT goals: Progressing toward goals  Acute Rehab OT Goals Patient Stated Goal: return to PLOF OT Goal Formulation:  With patient  Plan Discharge plan remains appropriate;Frequency remains appropriate    Co-evaluation                 AM-PAC OT "6 Clicks" Daily Activity     Outcome Measure   Help from another person eating meals?: None Help from another person taking care of personal grooming?: A Little Help from another person toileting, which includes using toliet, bedpan, or urinal?: Total Help from another person bathing (including washing, rinsing, drying)?: A Lot Help from another person to put on and taking off regular upper body clothing?: A Little Help from another person to put on and taking off regular lower body clothing?: A Lot 6 Click Score: 15    End of Session    OT Visit Diagnosis: Unsteadiness on feet (R26.81);History of falling (Z91.81);Pain Pain - part of body:  (chest- incisional)   Activity Tolerance Patient tolerated treatment well   Patient Left in chair;with call bell/phone within reach   Nurse Communication Mobility status        Time: 7622-6333 OT Time Calculation (min): 22 min  Charges: OT General Charges $OT Visit: 1 Visit OT Treatments $Self Care/Home Management : 8-22 mins  Jolaine Artist, OT Acute Rehabilitation Services Pager [phone] [place] 01/10/2021, 10:41 AM

## 2021-01-10 NOTE — Progress Notes (Signed)
CARDIAC REHAB PHASE I   PRE:  Rate/Rhythm: 64 afib  BP:  Supine: 94/37 Lower arm Sitting:   Standing:    SaO2: 95%RA  MODE:  Ambulation: 200 ft   POST:  Rate/Rhythm: 93 afib  BP:  Supine:   Sitting: 94/81, 83/66 per lower arm  Standing:    SaO2: 98%RA 0908-0955 Pt walked 200 ft on RA with rolling walker and asst x 1. One asst to follow with rollator. Pt sat once to rest at 75 ft and then walked 125 ft without need to sit. Pt standing by herself. To recliner with call bell. BP taken lower arm. Pt denied dizziness, getting stronger.  Encouraged more walks today with Mobility specialist and PT   Graylon Good, RN BSN  01/10/2021 10:17 AM

## 2021-01-10 NOTE — Progress Notes (Addendum)
Advanced Heart Failure Rounding Note   Subjective:   Valvular heart disease s/p MVR/TVR  on 5/12 Echo LVEF 55% RV moderately HK. Moderate TR. No significant MR. No effusion .  Off milrinone last week. CO-OX d/c over the weekend.   Creatinine trending up 2.4>2.5>2.7   Mild SOB with with exertion.    Objective:   Weight Range:  Vital Signs:   Temp:  [97.6 F (36.4 C)-98.2 F (36.8 C)] 98 F (36.7 C) (05/23 1000) Pulse Rate:  [60-79] 71 (05/23 1000) Resp:  [15-20] 19 (05/23 1000) BP: (101-120)/(42-68) 103/68 (05/23 1000) SpO2:  [97 %-100 %] 99 % (05/23 1000) Weight:  [126.8 kg] 126.8 kg (05/23 0448) Last BM Date: 01/08/21  Weight change: Filed Weights   01/08/21 0215 01/09/21 0417 01/10/21 0448  Weight: 128.6 kg 127.4 kg 126.8 kg    Intake/Output:   Intake/Output Summary (Last 24 hours) at 01/10/2021 1027 Last data filed at 01/10/2021 0515 Gross per 24 hour  Intake --  Output 2000 ml  Net -2000 ml     CVP 10-11 PHYSICAL EXAM: General:  Sitting in the chair.  No resp difficulty HEENT: normal Neck: supple. JVP 10-11. Carotids 2+ bilat; no bruits. No lymphadenopathy or thryomegaly appreciated. Cor: PMI nondisplaced. Regular rate & rhythm. No rubs, gallops or murmurs. Lungs: Crackles in the bases Abdomen: obese soft, nontender, nondistended. No hepatosplenomegaly. No bruits or masses. Good bowel sounds. Extremities: no cyanosis, clubbing, rash, R and LLE unna boots. RUE PICC  Neuro: alert & orientedx3, cranial nerves grossly intact. moves all 4 extremities w/o difficulty. Affect pleasant  Telemetry:  A fib 70-80s (chronic)    Labs: Basic Metabolic Panel: Recent Labs  Lab 01/05/21 0504 01/06/21 0513 01/07/21 0432 01/08/21 0439 01/08/21 0500 01/09/21 0504 01/10/21 0444  NA 135 134* 133*  --  132* 135 137  K 3.3* 3.6 3.2*  --  3.3* 3.5 3.3*  CL 93* 92* 91*  --  88* 92* 93*  CO2 34* 34* 35*  --  35* 34* 36*  GLUCOSE 139* 146* 109*  --  101* 98 107*   BUN 64* 62* 60*  --  58* 58* 60*  CREATININE 2.73* 2.36* 2.38*  --  2.56* 2.55* 2.72*  CALCIUM 9.2 9.2 9.1  --  9.2 9.2 9.2  MG  --   --   --  1.7  --   --   --   PHOS 3.2 3.6 4.0  --  4.7* 4.6  --     Liver Function Tests: Recent Labs  Lab 01/04/21 0339 01/05/21 0504 01/06/21 0513 01/07/21 0432 01/08/21 0500 01/09/21 0504  AST 21  --   --   --   --   --   ALT 5  --   --   --   --   --   ALKPHOS 104  --   --   --   --   --   BILITOT 0.8  --   --   --   --   --   PROT 7.1  --   --   --   --   --   ALBUMIN 3.0* 2.8* 2.8* 2.7* 2.7* 2.7*   No results for input(s): LIPASE, AMYLASE in the last 168 hours. No results for input(s): AMMONIA in the last 168 hours.  CBC: Recent Labs  Lab 01/04/21 0339 01/05/21 0504 01/08/21 0439 01/10/21 0444  WBC 10.1 10.1 9.4 7.7  HGB 7.7* 7.7* 8.3* 8.1*  HCT  23.8* 23.6* 25.6* 26.0*  MCV 88.1 88.7 88.0 89.3  PLT 200 226 294 331    Cardiac Enzymes: No results for input(s): CKTOTAL, CKMB, CKMBINDEX, TROPONINI in the last 168 hours.  BNP: BNP (last 3 results) Recent Labs    03/02/20 1527 11/22/20 1527 12/19/20 1016  BNP 43.7 132.8* 227.9*    ProBNP (last 3 results) No results for input(s): PROBNP in the last 8760 hours.    Other results:  Imaging: DG CHEST PORT 1 VIEW  Result Date: 01/09/2021 CLINICAL DATA:  Mitral valve repair on 12/29/2020. Patient with shortness of breath. Previous pneumothorax and pleural effusion. EXAM: PORTABLE CHEST 1 VIEW COMPARISON:  01/05/2021 and older exams. FINDINGS: Stable enlargement of the cardiopericardial silhouette and changes from mitral and tricuspid valve replacement. No mediastinal or hilar masses. No mediastinal widening. Linear opacities at the left lung base are stable consistent with atelectasis. Remainder of the lungs is clear. No convincing pleural effusion.  No pneumothorax. Stable right-sided PICC. IMPRESSION: 1. No acute findings or evidence of an operative complication. 2. No  pneumothorax.  No visualized pleural effusion. Electronically Signed   By: Lajean Manes M.D.   On: 01/09/2021 10:07     Medications:     Scheduled Medications: . (feeding supplement) PROSource Plus  30 mL Oral BID BM  . amiodarone  200 mg Oral BID  . apixaban  5 mg Oral BID  . bisacodyl  10 mg Oral Daily   Or  . bisacodyl  10 mg Rectal Daily  . Chlorhexidine Gluconate Cloth  6 each Topical Daily  . [place] [facility], Patient & Family Education   Does not apply Once  . docusate sodium  200 mg Oral Daily  . DULoxetine  60 mg Oral Daily  . insulin aspart  0-20 Units Subcutaneous TID WC  . insulin aspart  0-5 Units Subcutaneous QHS  . insulin aspart  3 Units Subcutaneous TID WC  . insulin detemir  30 Units Subcutaneous BID  . lactulose  20 g Oral Daily  . mouth rinse  15 mL Mouth Rinse BID  . multivitamin with minerals  1 tablet Oral Daily  . pantoprazole  40 mg Oral Daily  . potassium chloride  40 mEq Oral BID  . rosuvastatin  10 mg Oral Daily  . sodium chloride flush  3 mL Intravenous Q12H  . torsemide  60 mg Oral Daily    Infusions: . sodium chloride    . promethazine (PHENERGAN) injection (IM or IVPB) 6.25 mg (01/03/21 0829)    PRN Medications: sodium chloride, dextrose, metoprolol tartrate, ondansetron (ZOFRAN) IV, oxyCODONE, promethazine (PHENERGAN) injection (IM or IVPB), sodium chloride flush, sodium chloride flush   Assessment/Plan:   1. Valvular heart disease s/p MVR/TVR  on 5/12 - Severe TRs/p annuloplasty. Severe MRs/p annuloplasty on 12/30/20 - Echo 01/02/21 LVEF 55% RV moderately HK. Moderate TR. No significant MR. No effusion.  2. Acute on chronic systolic HF with R>>L HF - TEE 4/22 EF 45-50% RV moderately HK  - Echo LVEF 55% RV moderately HK. Moderate TR. No significant MR. No effusion. - CO-OX discontinued over the weekend.  - CVP trending up. CO2 trending up .  - Continue torsemide 60 mg daily.  - Give 250 mg diamox x 2 doses.  - Renal  function ok.  - No spiro with AKI - Hold off on SGLT2i for now with GFR 19   3. AKI on CKD 3b post-op  - baseline SCr 1.6-1.8 - Scr 4.1 ->->->  2.7 today. Likely post-op ATN - Plan as above - Keep MAP >= 70 - diuretics per nephrology   4. Longstanding Persistent Atrial Fibrillation s/p Maze - CHADSVASC 4+  - ASA 324 per TCTS - Continue amio 200 mg twice a day. Rate controlled.   -off digoxin with AKI and EF 55%.  -On eliquis 5 mg twice a day.   5. Morbid Obesity - needs weight loss  6. DM2 - SSI  - Considered SGLT2i but  GFR now 19  7. Hyponatremia -Resolved.   8. Hypokalemia - K 3.3. Supp K.   Length of Stay: [place] NP-C   01/10/2021, 10:27 AM  Advanced Heart Failure Team Pager [phone] (M-F; 7a - 4p)  Please contact [facility] for night-coverage after hours (4p -7a ) and weekends on [email or web address]  Agree with NP note.   Creatinine mildly higher today at 2.7, weight down about 1 lb on po torsemide.  CVP 10-11.  HCO3 rising at 36.  Will keep on po torsemide for now, will add acetazolamide 250 bid for 2 days and follow BMET closely.    Continue mobilization.   Loralie Champagne 01/10/2021

## 2021-01-10 NOTE — Progress Notes (Signed)
RT note. Patient placed on CPAP for the night

## 2021-01-10 NOTE — Progress Notes (Addendum)
      [address]       [place] [address]             [phone]      12 Days Post-Op Procedure(s) (LRB): MITRAL VALVE REPAIR USING CARBOMEDICS ANNULOFLEX RING SIZE 30 (N/A) 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 RING SIZE 34 (N/A) MAZE (N/A) TRANSESOPHAGEAL ECHOCARDIOGRAM (TEE) (N/A)   Subjective:  Patient doing okay.  Continues to have fatigue and shortness of breath with ambulation.  + BM  Objective: Vital signs in last 24 hours: Temp:  [97.3 F (36.3 C)-98.2 F (36.8 C)] 98.2 F (36.8 C) (05/23 0448) Pulse Rate:  [60-79] 76 (05/23 0448) Cardiac Rhythm: Atrial fibrillation (05/22 2007) Resp:  [15-22] 16 (05/23 0448) BP: (101-123)/(42-91) 120/57 (05/23 0448) SpO2:  [97 %-100 %] 100 % (05/23 0448) Weight:  [126.8 kg] 126.8 kg (05/23 0448)  Intake/Output from previous day: 05/22 0701 - 05/23 0700 In: 240 [P.O.:240] Out: 2000 [Urine:2000]  General appearance: alert, cooperative, no distress and morbidly obese Heart: irregularly irregular rhythm Lungs: clear to auscultation bilaterally Abdomen: soft, non-tender; bowel sounds normal; no masses,  no organomegaly Extremities: edema una boots in place Wound: clean and dry  Lab Results: Recent Labs    01/08/21 0439 01/10/21 0444  WBC 9.4 7.7  HGB 8.3* 8.1*  HCT 25.6* 26.0*  PLT 294 331   BMET:  Recent Labs    01/09/21 0504 01/10/21 0444  NA 135 137  K 3.5 3.3*  CL 92* 93*  CO2 34* 36*  GLUCOSE 98 107*  BUN 58* 60*  CREATININE 2.55* 2.72*  CALCIUM 9.2 9.2    PT/INR: No results for input(s): LABPROT, INR in the last 72 hours. ABG    Component Value Date/Time   PHART 7.368 12/30/2020 0050   HCO3 23.0 12/30/2020 0050   TCO2 24 12/30/2020 0050   ACIDBASEDEF 2.0 12/30/2020 0050   O2SAT 77.5 01/09/2021 0500   CBG (last 3)  Recent Labs    01/09/21 1638 01/09/21 2119 01/10/21 0556  GLUCAP 97 127* 120*    Assessment/Plan: S/P Procedure(s) (LRB): MITRAL VALVE REPAIR  USING CARBOMEDICS ANNULOFLEX RING SIZE 30 (N/A) 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 RING SIZE 34 (N/A) MAZE (N/A) TRANSESOPHAGEAL ECHOCARDIOGRAM (TEE) (N/A)  1. CV- rate controlled A. Fib, chronic on Amiodarone 200 mg BID, Eliquis 2. Pulm- no acute issues, off oxygen, CXR is free from significant pleural effusion, continue to have dyspnea on ambulation, suspect this is likely related to deconditioning 3. Renal- creatinine at 2.72, weight is below admission, on Demadex per AHF, K is at 3.3, will supplement K 4. DM- sugars are well controlled 5. Deconditioning- home PT/OT have been arranged, continue ambulation as able, progress is limited by deconditioning 6. Dispo- patient stable, remains in chronic rate controlled A. Fib, creatinine at 2.72, K is at 3.3 will supplement, continue PT/OT.Marland Kitchen patient has been stable off drips for >72 hours, home PT/OT arranged, home tomorrow if remains clinically stable and meds finalized with AHF   LOS: 20 days    Ellwood Handler, PA-C 01/10/2021 Patient seen and examined, agree with above Recheck creatinine in AM DC [place]. Roxan Hockey, MD [facility] [phone]

## 2021-01-11 DIAGNOSIS — I5033 Acute on chronic diastolic (congestive) heart failure: Secondary | ICD-10-CM | POA: Diagnosis not present

## 2021-01-11 DIAGNOSIS — I340 Nonrheumatic mitral (valve) insufficiency: Secondary | ICD-10-CM | POA: Diagnosis not present

## 2021-01-11 LAB — GLUCOSE, CAPILLARY
Glucose-Capillary: 144 mg/dL — ABNORMAL HIGH (ref 70–99)
Glucose-Capillary: 133 mg/dL — ABNORMAL HIGH (ref 70–99)
Glucose-Capillary: 67 mg/dL — ABNORMAL LOW (ref 70–99)
Glucose-Capillary: 119 mg/dL — ABNORMAL HIGH (ref 70–99)

## 2021-01-11 LAB — BASIC METABOLIC PANEL
Anion gap: 11 (ref 5–15)
BUN: 59 mg/dL — ABNORMAL HIGH (ref 8–23)
CO2: 32 mmol/L (ref 22–32)
Calcium: 9.1 mg/dL (ref 8.9–10.3)
Chloride: 91 mmol/L — ABNORMAL LOW (ref 98–111)
Creatinine, Ser: 2.75 mg/dL — ABNORMAL HIGH (ref 0.44–1.00)
GFR, Estimated: 19 mL/min — ABNORMAL LOW (ref >60)
Glucose, Bld: 129 mg/dL — ABNORMAL HIGH (ref 70–99)
Potassium: 3.6 mmol/L (ref 3.5–5.1)
Sodium: 134 mmol/L — ABNORMAL LOW (ref 135–145)

## 2021-01-11 LAB — COOXEMETRY PANEL
Carboxyhemoglobin: 1.3 % (ref 0.5–1.5)
Methemoglobin: 0.7 % (ref 0.0–1.5)
O2 Saturation: 54.6 %
Total hemoglobin: 9.6 g/dL — ABNORMAL LOW (ref 12.0–16.0)

## 2021-01-11 NOTE — Progress Notes (Signed)
Mobility Specialist: Progress Note   01/11/21 1235  Mobility  Activity Ambulated to bathroom  Level of Assistance Standby assist, set-up cues, supervision of patient - no hands on  Assistive Device Front wheel walker  Distance Ambulated (ft) 40 ft  Mobility Ambulated with assistance in room  Mobility Response Tolerated well  Mobility performed by Mobility specialist  Bed Position Chair  $Mobility charge 1 Mobility   Post-Mobility: 75 HR  Pt requesting to use BR but differing further mobility due to just finishing lunch. Pt asx during mobility. Will f/u as able.   [facility] Hollister Wessler Mobility Specialist Mobility Specialist Phone: [phone]

## 2021-01-11 NOTE — Progress Notes (Signed)
CARDIAC REHAB PHASE I   PRE:  Rate/Rhythm: 65 afib  BP:  Supine: 107/53  Sitting:   Standing:    SaO2: 97%RA  MODE:  Ambulation: 300 ft   POST:  Rate/Rhythm: 93 afib  BP:  Supine:   Sitting: 131/56  Standing:    SaO2: 97%RA 0952-1050 Helped pt get cleaned up as purewick leaked. Pt walked 300 ft on RA, sitting twice on rollator to rest. Asst x 2. Tolerated increase in distance well. To recliner with call bell.  Pt stands with very little assistance adhering to sternal precautions.   Graylon Good, RN BSN  01/11/2021 10:47 AM

## 2021-01-11 NOTE — Progress Notes (Signed)
RT note. Patient placed on cpap

## 2021-01-11 NOTE — Progress Notes (Addendum)
Advanced Heart Failure Rounding Note   Subjective:   Valvular heart disease s/p MVR/TVR  on 5/12 Echo LVEF 55% RV moderately HK. Moderate TR. No significant MR. No effusion .  CO-OX 54%  Creatinine trending up 2.4>2.5>2.7 >2.7   Complaining of weakness.    Objective:   Weight Range:  Vital Signs:   Temp:  [97.6 F (36.4 C)-98.4 F (36.9 C)] 98.4 F (36.9 C) (05/24 1112) Pulse Rate:  [64-82] 73 (05/24 1112) Resp:  [14-20] 20 (05/24 1112) BP: (100-111)/(53-76) 105/60 (05/24 1112) SpO2:  [97 %-99 %] 97 % (05/24 1112) Weight:  [127.8 kg] 127.8 kg (05/24 0624) Last BM Date: 01/08/21  Weight change: Filed Weights   01/09/21 0417 01/10/21 0448 01/11/21 0624  Weight: 127.4 kg 126.8 kg 127.8 kg    Intake/Output:   Intake/Output Summary (Last 24 hours) at 01/11/2021 1245 Last data filed at 01/11/2021 0900 Gross per 24 hour  Intake 960 ml  Output --  Net 960 ml     CVP 10-11 PHYSICAL EXAM: General:  In chair  No resp difficulty HEENT: normal Neck: supple. JVP 10-11. Carotids 2+ bilat; no bruits. No lymphadenopathy or thryomegaly appreciated. Cor: PMI nondisplaced. Irregular rate & rhythm. No rubs, gallops or murmurs. Lungs: clear Abdomen: soft, nontender, nondistended. No hepatosplenomegaly. No bruits or masses. Good bowel sounds. Extremities: no cyanosis, clubbing, rash, R and LLE unna boots.  Neuro: alert & orientedx3, cranial nerves grossly intact. moves all 4 extremities w/o difficulty. Affect pleasant   Telemetry:  A fib 70-80s   Labs: Basic Metabolic Panel: Recent Labs  Lab 01/05/21 0504 01/06/21 0513 01/07/21 0432 01/08/21 0439 01/08/21 0500 01/09/21 0504 01/10/21 0444 01/11/21 0605  NA 135 134* 133*  --  132* 135 137 134*  K 3.3* 3.6 3.2*  --  3.3* 3.5 3.3* 3.6  CL 93* 92* 91*  --  88* 92* 93* 91*  CO2 34* 34* 35*  --  35* 34* 36* 32  GLUCOSE 139* 146* 109*  --  101* 98 107* 129*  BUN 64* 62* 60*  --  58* 58* 60* 59*  CREATININE 2.73* 2.36*  2.38*  --  2.56* 2.55* 2.72* 2.75*  CALCIUM 9.2 9.2 9.1  --  9.2 9.2 9.2 9.1  MG  --   --   --  1.7  --   --   --   --   PHOS 3.2 3.6 4.0  --  4.7* 4.6  --   --     Liver Function Tests: Recent Labs  Lab 01/05/21 0504 01/06/21 0513 01/07/21 0432 01/08/21 0500 01/09/21 0504  ALBUMIN 2.8* 2.8* 2.7* 2.7* 2.7*   No results for input(s): LIPASE, AMYLASE in the last 168 hours. No results for input(s): AMMONIA in the last 168 hours.  CBC: Recent Labs  Lab 01/05/21 0504 01/08/21 0439 01/10/21 0444  WBC 10.1 9.4 7.7  HGB 7.7* 8.3* 8.1*  HCT 23.6* 25.6* 26.0*  MCV 88.7 88.0 89.3  PLT 226 294 331    Cardiac Enzymes: No results for input(s): CKTOTAL, CKMB, CKMBINDEX, TROPONINI in the last 168 hours.  BNP: BNP (last 3 results) Recent Labs    03/02/20 1527 11/22/20 1527 12/19/20 1016  BNP 43.7 132.8* 227.9*    ProBNP (last 3 results) No results for input(s): PROBNP in the last 8760 hours.    Other results:  Imaging: No results found.   Medications:     Scheduled Medications: . (feeding supplement) PROSource Plus  30 mL Oral BID  BM  . acetaZOLAMIDE  250 mg Oral BID  . amiodarone  200 mg Oral BID  . apixaban  5 mg Oral BID  . bisacodyl  10 mg Oral Daily   Or  . bisacodyl  10 mg Rectal Daily  . Chlorhexidine Gluconate Cloth  6 each Topical Daily  . [place] [facility], Patient & Family Education   Does not apply Once  . docusate sodium  200 mg Oral Daily  . DULoxetine  60 mg Oral Daily  . insulin aspart  0-20 Units Subcutaneous TID WC  . insulin aspart  0-5 Units Subcutaneous QHS  . insulin aspart  3 Units Subcutaneous TID WC  . insulin detemir  30 Units Subcutaneous BID  . lactulose  20 g Oral Daily  . mouth rinse  15 mL Mouth Rinse BID  . multivitamin with minerals  1 tablet Oral Daily  . pantoprazole  40 mg Oral Daily  . potassium chloride  40 mEq Oral BID  . rosuvastatin  10 mg Oral Daily  . sodium chloride flush  3 mL Intravenous Q12H  .  torsemide  60 mg Oral Daily    Infusions: . sodium chloride    . promethazine (PHENERGAN) injection (IM or IVPB) 6.25 mg (01/03/21 0829)    PRN Medications: sodium chloride, dextrose, metoprolol tartrate, ondansetron (ZOFRAN) IV, oxyCODONE, promethazine (PHENERGAN) injection (IM or IVPB), sodium chloride flush, sodium chloride flush   Assessment/Plan:   1. Valvular heart disease s/p MVR/TVR  on 5/12 - Severe TRs/p annuloplasty. Severe MRs/p annuloplasty on 12/30/20 - Echo 01/02/21 LVEF 55% RV moderately HK. Moderate TR. No significant MR. No effusion.  2. Acute on chronic systolic HF with R>>L HF - TEE 4/22 EF 45-50% RV moderately HK  - Echo LVEF 55% RV moderately HK. Moderate TR. No significant MR. No effusion. - CO-OX 54%.   - CVP 11-12. Continue torsemide 60 mg daily.  - Renal function ok.  - No spiro with AKI - Hold off on SGLT2i for now with GFR 19   3. AKI on CKD 3b post-op  - baseline SCr 1.6-1.8 - Scr 4.1 ->->-> 2.7>>2.7 . Likely post-op ATN - Plan as above - Keep MAP >= 70 - diuretics per nephrology   4. Longstanding Persistent Atrial Fibrillation s/p Maze - CHADSVASC 4+  - ASA 324 per TCTS - Continue amio 200 mg twice a day. Rate controlled.   -off digoxin with AKI and EF 55%.  -On eliquis 5 mg twice a day.   5. Morbid Obesity - needs weight loss  6. DM2 - SSI  - Considered SGLT2i but  GFR now 19  7. Hyponatremia -134 today.   8. Hypokalemia - K 3.6 3. Supp K.  PT recommending HH. Needs one more day.    Length of Stay: [place] NP-C   01/11/2021, 12:45 PM  Advanced Heart Failure Team Pager [phone] (M-F; 7a - 4p)  Please contact [facility] for night-coverage after hours (4p -7a ) and weekends on [email or web address]   Patient seen and examined with the above-signed Advanced Practice Provider and/or Housestaff. I personally reviewed laboratory data, imaging studies and relevant notes. I independently examined the patient and formulated  the important aspects of the plan. I have edited the note to reflect any of my changes or salient points. I have personally discussed the plan with the patient and/or family.  Volume status looks about as good as we can get it with CVP 12-14. Still feels weak. She  is worried about going home. Denies CP, orthopnea or PND. Co-ox 54%  Remains in AF on apixaban  General:  Sitting in chair No resp difficulty HEENT: normal Neck: supple. JVP 12-13  Carotids 2+ bilat; no bruits. No lymphadenopathy or thryomegaly appreciated. Cor: PMI nondisplaced. irregular rate & rhythm. No rubs, gallops or murmurs. Lungs: clear Abdomen: soft, nontender, nondistended. No hepatosplenomegaly. No bruits or masses. Good bowel sounds. Extremities: no cyanosis, clubbing, rash, edema + UNNA  Neuro: alert & orientedx3, cranial nerves grossly intact. moves all 4 extremities w/o difficulty. Affect pleasant  I think this is about as good as we will get her for now. Volume ok. Creatinine elevated but stable. PT has seen and recommend HHPT. Thus I feel comfortable with d/c in am on current meds. Will need consideration of DC-CV in several weeks after atrial irritation settles down from Maze.  Glori Bickers, MD  1:10 PM

## 2021-01-11 NOTE — Progress Notes (Signed)
Physical Therapy Treatment Patient Details Name: [name] MRN: [id number] DOB: 12-31-1955 Today's Date: 01/11/2021    History of Present Illness Patient is a 65 y/o female admitted on 12/19/20 with SOB, chest pain, N/V and emesis. Admitted with CHF exacerbation. s/p MVR,TVR and Maze 12/29/20. PMHx: HTN. DM2, A-fib, CHF, OSA, DVT, COPD.    PT Comments    Pt received in chair, agreeable to hallway gait training with rollator and transfer training but deferring stair trial due to fatigue after household distance gait task. Pt needing up to Supervision for functional mobility tasks but does need cues intermittently for Move in the Tube precaution compliance, particularly when fatigued and resting on one arm. VSS on RA. Assisted pt to don chest binder during session and she reports improved comfort with this in place. Pt continues to benefit from PT services to progress toward functional mobility goals. Continue to recommend HHPT.   Follow Up Recommendations  Home health PT;Supervision - Intermittent     Equipment Recommendations  Other (comment) (bari RW (vs rollator, continue to assess))    Recommendations for Other Services       Precautions / Restrictions Precautions Precautions: Fall;Sternal Precaution Booklet Issued: Yes (comment) Precaution Comments: premedicate; at times knees buckle Restrictions Weight Bearing Restrictions: No Other Position/Activity Restrictions: sternal precautions    Mobility  Bed Mobility               General bed mobility comments: received in recliner    Transfers Overall transfer level: Needs assistance Equipment used: Rolling walker (2 wheeled) Transfers: Sit to/from Stand Sit to Stand: Supervision         General transfer comment: from EOB, toilet and chair heights, able to stand without UE support  Ambulation/Gait Ambulation/Gait assistance: Min guard Gait Distance (Feet): 50 Feet (13ft, 15ft, 76ft (112ft total) with  seated breaks between) Assistive device: Rolling walker (2 wheeled) Gait Pattern/deviations: Step-through pattern;Decreased stance time - left;Decreased stride length;Decreased stance time - right Gait velocity: decreased   General Gait Details: slow, but steady, mild waddle. HR 71-82 bpm with exertion; SpO2 WNL on RA   Stairs             Wheelchair Mobility    Modified Rankin (Stroke Patients Only)       Balance Overall balance assessment: Needs assistance Sitting-balance support: Feet supported;No upper extremity supported Sitting balance-Leahy Scale: Good     Standing balance support: During functional activity;Bilateral upper extremity supported;No upper extremity supported Standing balance-Leahy Scale: Fair Standing balance comment: able to stand with U UE support for static standing and performed her own peri care no LOB but reliant on BUE support during gait                            Cognition Arousal/Alertness: Awake/alert Behavior During Therapy: WFL for tasks assessed/performed Overall Cognitive Status: Impaired/Different from baseline Area of Impairment: Problem solving;Memory                     Memory: Decreased recall of precautions;Decreased short-term memory       Problem Solving: Requires verbal cues General Comments: pt following commands with increased time, needs frequent prolonged rest breaks; needs reminders on Move in the Tube precautions as she tends to rest with one elbow out and pressure on that side when fatigued      Exercises Other Exercises Other Exercises: seated BLE AROM: ankle pumps, LAQ x10 reps ea  General Comments General comments (skin integrity, edema, etc.): SpO2 100% on RA, HR WNL; pt assisted to place chest binder for improved comfort/pain relief.      Pertinent Vitals/Pain Pain Assessment: Faces Faces Pain Scale: Hurts even more Pain Location: chest incision during transfers, decreased at rest.  reports pain is worst on RUE from fingertips up to chest incision, RN/MD aware Pain Descriptors / Indicators: Discomfort;Sore;Constant Pain Intervention(s): Limited activity within patient's tolerance;Monitored during session;Premedicated before session;Repositioned;Other (comment) (per PTA request, RN ordered chest binder from OR and assisted pt to don this during therapy session, pt reports increased comfort with this donned)    Home Living                      Prior Function            PT Goals (current goals can now be found in the care plan section) Acute Rehab PT Goals Patient Stated Goal: return to PLOF PT Goal Formulation: With patient Time For Goal Achievement: 01/21/21 Potential to Achieve Goals: Good Progress towards PT goals: Progressing toward goals    Frequency    Min 3X/week      PT Plan Current plan remains appropriate    Co-evaluation              AM-PAC PT "6 Clicks" Mobility   Outcome Measure  Help needed turning from your back to your side while in a flat bed without using bedrails?: None Help needed moving from lying on your back to sitting on the side of a flat bed without using bedrails?: A Little Help needed moving to and from a bed to a chair (including a wheelchair)?: A Little Help needed standing up from a chair using your arms (e.g., wheelchair or bedside chair)?: A Little Help needed to walk in hospital room?: A Little Help needed climbing 3-5 steps with a railing? : A Little 6 Click Score: 19    End of Session Equipment Utilized During Treatment: Gait belt Activity Tolerance: Patient tolerated treatment well Patient left: in chair;with call bell/phone within reach (reclined in chair, pt able to state how to use call bell and OK to leave chair alarm off per RN.) Nurse Communication: Mobility status PT Visit Diagnosis: Other abnormalities of gait and mobility (R26.89);Muscle weakness (generalized) (M62.81);Pain Pain - part of  body:  (chest incision and RUE)     Time: 5852-7782 PT Time Calculation (min) (ACUTE ONLY): 28 min  Charges:  $Gait Training: 8-22 mins $Therapeutic Activity: 8-22 mins                     Schylar Wuebker P., PTA Acute Rehabilitation Services Pager: [phone] Office: [place] 01/11/2021, 5:56 PM

## 2021-01-11 NOTE — TOC Progression Note (Signed)
Transition of Care (TOC) - Progression Note  Heart Failure   Patient Details  Name: Robby Pirani MRN: [id number] Date of Birth: 1956-07-04  Transition of Care [facility]) CM/SW [place], [employer] Phone Number: 01/11/2021, 12:18 PM  Clinical Narrative:    CSW spoke with the patient at bedside to bring them an appointment card for the [facility] outpatient clinic and encouraged them to follow up and to attend the appointment and bring their medications and if anything changes to please reach out so that CSW/HV clinic team can provide support. CSW scheduled a hospital follow up with patients PCP Juluis Mire, NP for 02/15/21 at 9:10am and informed the patient of this appointment. Mrs. [name] denied needing anything at this time and reported she will reach out for support if needed.  CSW will continue to follow throughout discharge     Barriers to Discharge: Continued Medical Work up  Expected Discharge Plan and Services   In-house Referral: Clinical Social Work     Living arrangements for the past 2 months: Apartment                                       Social Determinants of Health (SDOH) Interventions Food Insecurity Interventions: Other (Comment) (patient has food stamps) Financial Strain Interventions: Intervention Not Indicated Housing Interventions: Intervention Not Indicated Transportation Interventions: [facility]  Readmission Risk Interventions Readmission Risk Prevention Plan 03/08/2020  Transportation Screening Complete  Medication Review Press photographer) Complete  PCP or Specialist appointment within 3-5 days of discharge Complete  HRI or [place] Complete  SW Recovery Care/Counseling Consult Complete  [place] Not Applicable  Some recent data might be hidden   Kaysi Ourada, MSW, LCSWA [phone] Heart Failure Social Worker

## 2021-01-11 NOTE — Progress Notes (Addendum)
      [address]       [place] [address]             [phone]      13 Days Post-Op Procedure(s) (LRB): MITRAL VALVE REPAIR USING CARBOMEDICS ANNULOFLEX RING SIZE 30 (N/A) 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 RING SIZE 34 (N/A) MAZE (N/A) TRANSESOPHAGEAL ECHOCARDIOGRAM (TEE) (N/A)   Subjective:  No new complaints.  Continues to have shortness of breath with activity.  No N/V.  + BM  Objective: Vital signs in last 24 hours: Temp:  [97.6 F (36.4 C)-98.2 F (36.8 C)] 97.7 F (36.5 C) (05/24 0426) Pulse Rate:  [64-82] 73 (05/24 0426) Cardiac Rhythm: Atrial fibrillation (05/23 1903) Resp:  [14-20] 14 (05/24 0624) BP: (93-111)/(53-76) 100/53 (05/24 0426) SpO2:  [97 %-99 %] 98 % (05/23 2323) Weight:  [127.8 kg] 127.8 kg (05/24 0624)  Hemodynamic parameters for last 24 hours: CVP:  [10 mmHg-11 mmHg] 10 mmHg  Intake/Output from previous day: 05/23 0701 - 05/24 0700 In: 960 [P.O.:960] Out: -   General appearance: alert, cooperative and no distress Heart: irregularly irregular rhythm Lungs: clear to auscultation bilaterally Abdomen: soft, non-tender; bowel sounds normal; no masses,  no organomegaly Extremities: edema trace, una boots in place Wound: clean and dry  Lab Results: Recent Labs    01/10/21 0444  WBC 7.7  HGB 8.1*  HCT 26.0*  PLT 331   BMET:  Recent Labs    01/09/21 0504 01/10/21 0444  NA 135 137  K 3.5 3.3*  CL 92* 93*  CO2 34* 36*  GLUCOSE 98 107*  BUN 58* 60*  CREATININE 2.55* 2.72*  CALCIUM 9.2 9.2    PT/INR: No results for input(s): LABPROT, INR in the last 72 hours. ABG    Component Value Date/Time   PHART 7.368 12/30/2020 0050   HCO3 23.0 12/30/2020 0050   TCO2 24 12/30/2020 0050   ACIDBASEDEF 2.0 12/30/2020 0050   O2SAT 77.5 01/09/2021 0500   CBG (last 3)  Recent Labs    01/10/21 1717 01/10/21 2054 01/11/21 0618  GLUCAP 152* 109* 144*    Assessment/Plan: S/P Procedure(s) (LRB): MITRAL  VALVE REPAIR USING CARBOMEDICS ANNULOFLEX RING SIZE 30 (N/A) 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 RING SIZE 34 (N/A) MAZE (N/A) TRANSESOPHAGEAL ECHOCARDIOGRAM (TEE) (N/A)  1. CV- rate controlled A. Fib- off Milrinone, on Amiodarone 200 mg BID, Eliquis 2. Pulm- no acute issues, off oxygen, continue IS 3. Renal- creatinine slightly elevated, yesterday, repeat today is stable at 2.75.  Repeat K level is 3.6.  She remains on Demadex and was started on Diamox yesterday.  Her CO2 has improved to 32 4. DM- sugars are well controlled 5. Deconditioning- continue PT/OT, home heath arrangements have been made, equipment has been ordered 6. Dispo- patient stable, remains in rate controlled A. Fib, may attempt Cardioversion in the future, creatinine remained stable, for discharge today if AHF teams feels patient is stable from their standpoint    LOS: 21 days   Ellwood Handler, PA-C 01/11/2021 Patient seen and examined, agree with above Diamox added per AHF team  Remo Lipps C. Roxan Hockey, MD [facility] [phone]

## 2021-01-11 NOTE — Care Management Important Message (Signed)
Important Message  Patient Details  Name: [name] MRN: [id number] Date of Birth: 11-11-1955   Medicare Important Message Given:  Yes     Shelda Altes 01/11/2021, 9:45 AM

## 2021-01-12 ENCOUNTER — Other Ambulatory Visit (HOSPITAL_COMMUNITY): Payer: Self-pay

## 2021-01-12 DIAGNOSIS — I5023 Acute on chronic systolic (congestive) heart failure: Secondary | ICD-10-CM | POA: Diagnosis not present

## 2021-01-12 LAB — BASIC METABOLIC PANEL
Anion gap: 10 (ref 5–15)
BUN: 56 mg/dL — ABNORMAL HIGH (ref 8–23)
CO2: 33 mmol/L — ABNORMAL HIGH (ref 22–32)
Calcium: 9.4 mg/dL (ref 8.9–10.3)
Chloride: 94 mmol/L — ABNORMAL LOW (ref 98–111)
Creatinine, Ser: 2.81 mg/dL — ABNORMAL HIGH (ref 0.44–1.00)
GFR, Estimated: 18 mL/min — ABNORMAL LOW (ref >60)
Glucose, Bld: 126 mg/dL — ABNORMAL HIGH (ref 70–99)
Potassium: 3.9 mmol/L (ref 3.5–5.1)
Sodium: 137 mmol/L (ref 135–145)

## 2021-01-12 LAB — GLUCOSE, CAPILLARY
Glucose-Capillary: 132 mg/dL — ABNORMAL HIGH (ref 70–99)
Glucose-Capillary: 99 mg/dL (ref 70–99)

## 2021-01-12 MED ORDER — OXYCODONE HCL 5 MG PO TABS
5.0000 mg | ORAL_TABLET | ORAL | 0 refills | Status: DC | PRN
  Filled 2021-01-12: qty 30, 5d supply, fill #0

## 2021-01-12 MED ORDER — TORSEMIDE 20 MG PO TABS
60.0000 mg | ORAL_TABLET | Freq: Every day | ORAL | 3 refills | Status: DC
  Filled 2021-01-12: qty 90, 30d supply, fill #0

## 2021-01-12 NOTE — Progress Notes (Signed)
      [address]       [place] [address]             [phone]      14 Days Post-Op Procedure(s) (LRB): MITRAL VALVE REPAIR USING CARBOMEDICS ANNULOFLEX RING SIZE 30 (N/A) 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 RING SIZE 34 (N/A) MAZE (N/A) TRANSESOPHAGEAL ECHOCARDIOGRAM (TEE) (N/A)   Subjective:  No new complaints.  Ambulated yesterday a little further than she did previously.  She feels like she made a little progress.  Denies N/V.  Objective: Vital signs in last 24 hours: Temp:  [97.7 F (36.5 C)-98.4 F (36.9 C)] 98.2 F (36.8 C) (05/25 0339) Pulse Rate:  [62-82] 74 (05/25 0339) Cardiac Rhythm: Atrial fibrillation (05/24 1900) Resp:  [16-20] 17 (05/25 0339) BP: (95-115)/(46-65) 114/64 (05/25 0339) SpO2:  [97 %-100 %] 99 % (05/25 0339) Weight:  [128.1 kg] 128.1 kg (05/25 0339)  Hemodynamic parameters for last 24 hours: CVP:  [11 mmHg] 11 mmHg  Intake/Output from previous day: 05/24 0701 - 05/25 0700 In: 820 [P.O.:820] Out: 500 [Urine:500]  General appearance: alert, cooperative and no distress Heart: irregularly irregular rhythm Lungs: clear to auscultation bilaterally Abdomen: soft, non-tender; bowel sounds normal; no masses,  no organomegaly Extremities: una boots in place Wound: clean and dry  Lab Results: Recent Labs    01/10/21 0444  WBC 7.7  HGB 8.1*  HCT 26.0*  PLT 331   BMET:  Recent Labs    01/11/21 0605 01/12/21 0430  NA 134* 137  K 3.6 3.9  CL 91* 94*  CO2 32 33*  GLUCOSE 129* 126*  BUN 59* 56*  CREATININE 2.75* 2.81*  CALCIUM 9.1 9.4    PT/INR: No results for input(s): LABPROT, INR in the last 72 hours. ABG    Component Value Date/Time   PHART 7.368 12/30/2020 0050   HCO3 23.0 12/30/2020 0050   TCO2 24 12/30/2020 0050   ACIDBASEDEF 2.0 12/30/2020 0050   O2SAT 54.6 01/11/2021 1136   CBG (last 3)  Recent Labs    01/11/21 1638 01/11/21 2117 01/12/21 0612  GLUCAP 67* 119* 132*     Assessment/Plan: S/P Procedure(s) (LRB): MITRAL VALVE REPAIR USING CARBOMEDICS ANNULOFLEX RING SIZE 30 (N/A) 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 RING SIZE 34 (N/A) MAZE (N/A) TRANSESOPHAGEAL ECHOCARDIOGRAM (TEE) (N/A)  1. CV- remains in rate controlled A. Fib, on Amiodarone 200 mg BID, Eliquis 2. Pulm- no issues, off oxygen, continue IS 3. Renal- creatinine stable 2.75-2.8 today, K is at 3.9, has completed Diamox, remains on Demadex per AHF 4. DM- sugars stable, continue current regimen 5. Deconditioning- continue PT/OT, home health arrangements have been made 6. Dispo- patient stable, remains in rate controlled Atrial Fibrillation on Eliquis, CO2 remains stable, home arrangements arranged, home when AHF feels she is stable from their standpoint   LOS: 22 days    Ellwood Handler, PA-C 01/12/2021

## 2021-01-12 NOTE — Progress Notes (Signed)
[id number] Discussed sternal precautions and staying in the tube. Encouraged pt to increase distance with walking as she felt safe doing. Pt stated she has rollator at home and did not want bariatric rolling walker. Encouraged IS. Gave low sodium diets and encouraged 2000 mg sodium restriction. Encouraged daily weights and calling if 3 pounds overnight or 5 in a week. Encouraged 1500 ml FR. Discussed CRP 2. Pt will consider once she finishes HHPT. Pt voiced understanding of ed.  Graylon Good RN BSN 01/12/2021 1:35 PM

## 2021-01-12 NOTE — Progress Notes (Signed)
Mobility Specialist: Progress Note   01/12/21 1222  Mobility  Activity Ambulated in hall  Level of Assistance Standby assist, set-up cues, supervision of patient - no hands on  Assistive Device Front wheel walker  Distance Ambulated (ft) 256 ft  Mobility Ambulated with assistance in hallway  Mobility Response Tolerated well  Mobility performed by Mobility specialist  Bed Position Chair  $Mobility charge 1 Mobility   Post-Mobility: 84 HR  Pt to BR and then agreeable to ambulate. Pt stopped for one seated break lasting 1-2 minutes due to fatigue and SOB. Pt did c/o 7/10 chest pain as well. Pt back to recliner after walk with RN present in room.   [name] [name] Mobility Specialist Mobility Specialist Phone: [phone]

## 2021-01-12 NOTE — Progress Notes (Addendum)
Advanced Heart Failure Rounding Note   Subjective:   Valvular heart disease s/p MVR/TVR  on 5/12 Echo LVEF 55% RV moderately HK. Moderate TR. No significant MR. No effusion .  Creatinine trending up 2.4>2.5>2.7 >2.7>2.8    Denies SOB.    Objective:   Weight Range:  Vital Signs:   Temp:  [97.7 F (36.5 C)-98.4 F (36.9 C)] 97.7 F (36.5 C) (05/25 0808) Pulse Rate:  [62-82] 70 (05/25 0808) Resp:  [16-20] 20 (05/25 1000) BP: (95-115)/(46-80) 100/80 (05/25 0808) SpO2:  [97 %-100 %] 99 % (05/25 0808) Weight:  [128.1 kg] 128.1 kg (05/25 0339) Last BM Date: 01/09/21  Weight change: Filed Weights   01/10/21 0448 01/11/21 0624 01/12/21 0339  Weight: 126.8 kg 127.8 kg 128.1 kg    Intake/Output:   Intake/Output Summary (Last 24 hours) at 01/12/2021 1058 Last data filed at 01/12/2021 0348 Gross per 24 hour  Intake 580 ml  Output 500 ml  Net 80 ml     CVP 9-10 I personally checked in the chair.   PHYSICAL EXAM: General:  Well appearing. No resp difficulty HEENT: normal Neck: supple. JVP 9-10 . Carotids 2+ bilat; no bruits. No lymphadenopathy or thryomegaly appreciated. Cor: PMI nondisplaced. Irregular rate & rhythm. No rubs, gallops or murmurs. Sternal incision approximated.  Lungs: clear Abdomen: soft, nontender, nondistended. No hepatosplenomegaly. No bruits or masses. Good bowel sounds. Extremities: no cyanosis, clubbing, rash, edema Neuro: alert & orientedx3, cranial nerves grossly intact. moves all 4 extremities w/o difficulty. Affect pleasant  Telemetry:   A Fib 70-80s   Labs: Basic Metabolic Panel: Recent Labs  Lab 01/06/21 0513 01/07/21 0432 01/08/21 0439 01/08/21 0500 01/09/21 0504 01/10/21 0444 01/11/21 0605 01/12/21 0430  NA 134* 133*  --  132* 135 137 134* 137  K 3.6 3.2*  --  3.3* 3.5 3.3* 3.6 3.9  CL 92* 91*  --  88* 92* 93* 91* 94*  CO2 34* 35*  --  35* 34* 36* 32 33*  GLUCOSE 146* 109*  --  101* 98 107* 129* 126*  BUN 62* 60*  --  58*  58* 60* 59* 56*  CREATININE 2.36* 2.38*  --  2.56* 2.55* 2.72* 2.75* 2.81*  CALCIUM 9.2 9.1  --  9.2 9.2 9.2 9.1 9.4  MG  --   --  1.7  --   --   --   --   --   PHOS 3.6 4.0  --  4.7* 4.6  --   --   --     Liver Function Tests: Recent Labs  Lab 01/06/21 0513 01/07/21 0432 01/08/21 0500 01/09/21 0504  ALBUMIN 2.8* 2.7* 2.7* 2.7*   No results for input(s): LIPASE, AMYLASE in the last 168 hours. No results for input(s): AMMONIA in the last 168 hours.  CBC: Recent Labs  Lab 01/08/21 0439 01/10/21 0444  WBC 9.4 7.7  HGB 8.3* 8.1*  HCT 25.6* 26.0*  MCV 88.0 89.3  PLT 294 331    Cardiac Enzymes: No results for input(s): CKTOTAL, CKMB, CKMBINDEX, TROPONINI in the last 168 hours.  BNP: BNP (last 3 results) Recent Labs    03/02/20 1527 11/22/20 1527 12/19/20 1016  BNP 43.7 132.8* 227.9*    ProBNP (last 3 results) No results for input(s): PROBNP in the last 8760 hours.    Other results:  Imaging: No results found.   Medications:     Scheduled Medications: . (feeding supplement) PROSource Plus  30 mL Oral BID BM  .  amiodarone  200 mg Oral BID  . apixaban  5 mg Oral BID  . bisacodyl  10 mg Oral Daily   Or  . bisacodyl  10 mg Rectal Daily  . Chlorhexidine Gluconate Cloth  6 each Topical Daily  . [place] [facility], Patient & Family Education   Does not apply Once  . docusate sodium  200 mg Oral Daily  . DULoxetine  60 mg Oral Daily  . insulin aspart  0-20 Units Subcutaneous TID WC  . insulin aspart  0-5 Units Subcutaneous QHS  . insulin aspart  3 Units Subcutaneous TID WC  . insulin detemir  30 Units Subcutaneous BID  . lactulose  20 g Oral Daily  . mouth rinse  15 mL Mouth Rinse BID  . multivitamin with minerals  1 tablet Oral Daily  . pantoprazole  40 mg Oral Daily  . rosuvastatin  10 mg Oral Daily  . sodium chloride flush  3 mL Intravenous Q12H  . torsemide  60 mg Oral Daily    Infusions: . sodium chloride    . promethazine (PHENERGAN)  injection (IM or IVPB) 6.25 mg (01/03/21 0829)    PRN Medications: sodium chloride, dextrose, metoprolol tartrate, ondansetron (ZOFRAN) IV, oxyCODONE, promethazine (PHENERGAN) injection (IM or IVPB), sodium chloride flush, sodium chloride flush   Assessment/Plan:   1. Valvular heart disease s/p MVR/TVR  on 5/12 - Severe TRs/p annuloplasty. Severe MRs/p annuloplasty on 12/30/20 - Echo 01/02/21 LVEF 55% RV moderately HK. Moderate TR. No significant MR. No effusion.  2. Acute on chronic systolic HF with R>>L HF - TEE 4/22 EF 45-50% RV moderately HK  - Echo LVEF 55% RV moderately HK. Moderate TR. No significant MR. No effusion. -CVP 9-10 . Continue torsemide 60 mg daily.  - Renal function ok.  - No spiro with AKI - Hold off on SGLT2i for now with GFR 19   3. AKI on CKD 3b post-op  - baseline SCr 1.6-1.8 - Scr 4.1 ->->-> 2.7>>2.7>>2.8  - Likely post-op ATN - Keep MAP >= 70 - diuretics per nephrology   4. Longstanding Persistent Atrial Fibrillation s/p Maze - CHADSVASC 4+  - ASA 324 per TCTS -Rate controlled.  - Continue amio 200 mg twice a day.  -off digoxin with AKI and EF 55%.  -On eliquis 5 mg twice a day.   5. Morbid Obesity - needs weight loss  6. DM2 - SSI  - Considered SGLT2i but  GFR now 19  7. Hyponatremia -137 today.   8. Hypokalemia - K stable.   HF meds for d/c  AMio 200 mg twice a day  Eliquis 5 mg twice a day  Torsemide 60 mg daily  Crestor 10 mg daily   Ok for d/c . HF Follow up has been set up.  Will need consideration of DC-CV in several weeks after atrial irritation settles down from Maze     Length of Stay: [place] NP-C   01/12/2021, 10:58 AM  Advanced Heart Failure Team Pager [phone] (M-F; [place])  Please contact [facility] for night-coverage after hours (4p -7a ) and weekends on [email or web address]  Patient seen and examined with the above-signed Advanced Practice Provider and/or Housestaff. I personally reviewed laboratory  data, imaging studies and relevant notes. I independently examined the patient and formulated the important aspects of the plan. I have edited the note to reflect any of my changes or salient points. I have personally discussed the plan with the patient and/or  family.  Volume status looks good. CVP 9-10. Lungs clear. Creatinine elevated but stable.   [place] for d/c today on above meds. Will arrange near f/u in HF Clinic.   Glori Bickers, MD  6:09 PM

## 2021-01-12 NOTE — Progress Notes (Signed)
Occupational Therapy Treatment Patient Details Name: [name] MRN: [id number] DOB: 1955-11-19 Today's Date: 01/12/2021    History of present illness Patient is a 65 y/o female admitted on 12/19/20 with SOB, chest pain, N/V and emesis. Admitted with CHF exacerbation. s/p MVR,TVR and Maze 12/29/20. PMHx: HTN. DM2, A-fib, CHF, OSA, DVT, COPD.   OT comments  Issued AE for LB self care, reviewed use with pt demonstrating good recall.  Reviewed sternal precautions and requires up to min cueing for recalling ADL compensatory techniques with bathing/dressing.  Reports plan to have family assist with toileting needs.  Continue to recommend [place].  Will follow.   Follow Up Recommendations  Home health OT;Supervision/Assistance - 24 hour    Equipment Recommendations  Tub/shower bench    Recommendations for Other Services      Precautions / Restrictions Precautions Precautions: Fall;Sternal Precaution Booklet Issued: Yes (comment) Precaution Comments: premedicate; at times knees buckle Restrictions Other Position/Activity Restrictions: sternal precautions       Mobility Bed Mobility               General bed mobility comments: reamined bed level due to PICC removal and bedrest for 30 min    Transfers                      Balance                                           ADL either performed or assessed with clinical judgement   ADL Overall ADL's : Needs assistance/impaired                                       General ADL Comments: education on AE continued for LB self care, pt able to verbalize use of reacher, sock aide, shoe horn, long sponge.  Min cueing to verbally describe how to complete ADLs with "staying in the tube'     Vision       Perception     Praxis      Cognition Arousal/Alertness: Awake/alert Behavior During Therapy: WFL for tasks assessed/performed Overall Cognitive Status: Impaired/Different  from baseline Area of Impairment: Memory;Problem solving                     Memory: Decreased recall of precautions;Decreased short-term memory       Problem Solving: Requires verbal cues General Comments: patient able to recall sternal precautions and ADL compensatory techniques (modified techniques and use of AE) with increased time given minimal verbal cues.        Exercises     Shoulder Instructions       General Comments      Pertinent Vitals/ Pain       Pain Assessment: Faces Faces Pain Scale: No hurt Pain Intervention(s): Monitored during session  Home Living                                          Prior Functioning/Environment              Frequency  Min 2X/week        Progress Toward Goals  OT Goals(current goals can now be  found in the care plan section)  Progress towards OT goals: Progressing toward goals  Acute Rehab OT Goals Patient Stated Goal: return to PLOF OT Goal Formulation: With patient  Plan Discharge plan remains appropriate;Frequency remains appropriate    Co-evaluation                 AM-PAC OT "6 Clicks" Daily Activity     Outcome Measure   Help from another person eating meals?: None Help from another person taking care of personal grooming?: A Little Help from another person toileting, which includes using toliet, bedpan, or urinal?: Total Help from another person bathing (including washing, rinsing, drying)?: A Lot Help from another person to put on and taking off regular upper body clothing?: A Little Help from another person to put on and taking off regular lower body clothing?: A Little 6 Click Score: 16    End of Session    OT Visit Diagnosis: Unsteadiness on feet (R26.81);History of falling (Z91.81);Pain   Activity Tolerance Patient tolerated treatment well   Patient Left in bed;with call bell/phone within reach;with bed alarm set   Nurse Communication Mobility status         Time: 7416-3845 OT Time Calculation (min): 11 min  Charges: OT General Charges $OT Visit: 1 Visit OT Treatments $Self Care/Home Management : 8-22 mins  Jolaine Artist, OT [place] Pager [phone] Office [place] 01/12/2021, 3:34 PM

## 2021-01-12 NOTE — Progress Notes (Signed)
CPV zeroed and calibrated per protocol CVP this AM 18=19. Will monitor. Samiha Denapoli, Bettina Gavia RN

## 2021-01-13 ENCOUNTER — Telehealth: Payer: Self-pay

## 2021-01-13 NOTE — Telephone Encounter (Signed)
Transition Care Management Unsuccessful Follow-up Telephone Call  Date of discharge and from where:  01/12/2021, [facility]   Attempts:  1st Attempt  Reason for unsuccessful TCM follow-up call:  Left voice message o # [phone].  Patient has appointment with Juluis Mire, NP @ [facility] 02/15/2021.  Need to inquire if she would like to be seen sooner

## 2021-01-14 ENCOUNTER — Telehealth: Payer: Self-pay

## 2021-01-14 NOTE — Telephone Encounter (Signed)
Transition Care Management Unsuccessful Follow-up Telephone Call  Date of discharge and from where:  01/12/2021, [facility]   Attempts:  2nd Attempt  Reason for unsuccessful TCM follow-up call: Unable to reach left voice message at  # [phone].  Patient has appointment with Juluis Mire, NP @ [facility] 02/15/2021.  Need to inquire if she would like to be seen sooner

## 2021-01-18 ENCOUNTER — Telehealth: Payer: Self-pay

## 2021-01-18 NOTE — Telephone Encounter (Signed)
Transition Care Management Follow-up Telephone Call  Date of discharge and from where: 01/12/2021, [facility]   How have you been since you were released from the hospital? She said that she is feeling pretty good except she has not moved her bowels and needs to obtain a stool softener  Any questions or concerns? Yes - noted above regarding bowels  Items Reviewed:  Did the pt receive and understand the discharge instructions provided? Yes   Medications obtained and verified? Yes  - she said she has all medications and did not have any questions about her med regime,  Other? No   Any new allergies since your discharge? No   Dietary orders reviewed? No  Do you have support at home? Yes   Home Care and Equipment/Supplies: Were home health services ordered? yes If so, what is the name of the agency? [facility]  Has the agency set up a time to come to the patient's home? She said that they spoke to her daughter and are supposed to call today to schedule the home visit Were any new equipment or medical supplies ordered?  Yes: shower chair What is the name of the medical supply agency? [facility] Were you able to get the supplies/equipment? yes Do you have any questions related to the use of the equipment or supplies? No   She also has a glucometer and scale.   Functional Questionnaire: (I = Independent and D = Dependent) ADLs: independent. She has a cane to use with ambulation and said that she needs to just it slow.   Follow up appointments reviewed:   PCP Hospital f/u appt confirmed? Yes  Leslie Mire, NP 02/08/2021 @ [facility] f/u appt confirmed? Yes cardiology- 01/25/2021, CTS - 02/01/2021. Reminded her that the appointment information is on the AVS   Are transportation arrangements needed? No   If their condition worsens, is the pt aware to call PCP or go to the Emergency Dept.? Yes  Was the patient provided with contact information for the  PCP's office or ED? Yes  Was to pt encouraged to call back with questions or concerns? Yes

## 2021-01-20 ENCOUNTER — Telehealth (INDEPENDENT_AMBULATORY_CARE_PROVIDER_SITE_OTHER): Payer: Self-pay | Admitting: Primary Care

## 2021-01-20 NOTE — Telephone Encounter (Signed)
Home Health Verbal Orders - Caller/Agency: Anda Kraft with [facility] [phone]  Pt for 1 x 1 and 2 x 8weeks\

## 2021-01-21 ENCOUNTER — Telehealth (INDEPENDENT_AMBULATORY_CARE_PROVIDER_SITE_OTHER): Payer: Self-pay | Admitting: Primary Care

## 2021-01-21 NOTE — Telephone Encounter (Signed)
Please follow up

## 2021-01-21 NOTE — Telephone Encounter (Unsigned)
Copied from [id number]. Topic: General - Other >> Jan 21, 2021  2:33 PM Holley Dexter N wrote: Reason for CRM: Bridgett from [facility] called stating that she received a prescription for the pt diabetic shoes, she states that the form was not filled out correctly, and that question 3 needs to be filled out with a secondary diagnoses code.

## 2021-01-21 NOTE — Telephone Encounter (Signed)
Verbal orders provided. ?

## 2021-01-24 ENCOUNTER — Telehealth (HOSPITAL_COMMUNITY): Payer: Self-pay

## 2021-01-24 NOTE — Telephone Encounter (Signed)
Called patient to see if she is interested in the Cardiac Rehab Program. Patient expressed interest. Explained scheduling process and went over insurance, patient verbalized understanding. Will contact patient for scheduling once f/u has been completed. 

## 2021-01-25 ENCOUNTER — Ambulatory Visit (HOSPITAL_COMMUNITY)
Admit: 2021-01-25 | Discharge: 2021-01-25 | Disposition: A | Discharge status: Home / Self Care | Payer: Medicare Other | Attending: Cardiology | Admitting: Cardiology

## 2021-01-25 ENCOUNTER — Encounter (HOSPITAL_COMMUNITY): Payer: Self-pay

## 2021-01-25 ENCOUNTER — Other Ambulatory Visit (HOSPITAL_COMMUNITY): Payer: Self-pay

## 2021-01-25 ENCOUNTER — Other Ambulatory Visit: Payer: Self-pay

## 2021-01-25 VITALS — BP 102/54 | HR 110 | Wt 252.6 lb

## 2021-01-25 DIAGNOSIS — Z6839 Body mass index (BMI) 39.0-39.9, adult: Secondary | ICD-10-CM | POA: Diagnosis not present

## 2021-01-25 DIAGNOSIS — Z7901 Long term (current) use of anticoagulants: Secondary | ICD-10-CM | POA: Insufficient documentation

## 2021-01-25 DIAGNOSIS — Z79899 Other long term (current) drug therapy: Secondary | ICD-10-CM | POA: Insufficient documentation

## 2021-01-25 DIAGNOSIS — I5033 Acute on chronic diastolic (congestive) heart failure: Secondary | ICD-10-CM | POA: Diagnosis not present

## 2021-01-25 DIAGNOSIS — Z794 Long term (current) use of insulin: Secondary | ICD-10-CM | POA: Insufficient documentation

## 2021-01-25 DIAGNOSIS — I5042 Chronic combined systolic (congestive) and diastolic (congestive) heart failure: Secondary | ICD-10-CM | POA: Insufficient documentation

## 2021-01-25 DIAGNOSIS — I482 Chronic atrial fibrillation, unspecified: Secondary | ICD-10-CM

## 2021-01-25 DIAGNOSIS — I4819 Other persistent atrial fibrillation: Secondary | ICD-10-CM | POA: Diagnosis not present

## 2021-01-25 DIAGNOSIS — Z86718 Personal history of other venous thrombosis and embolism: Secondary | ICD-10-CM | POA: Insufficient documentation

## 2021-01-25 DIAGNOSIS — Z8249 Family history of ischemic heart disease and other diseases of the circulatory system: Secondary | ICD-10-CM | POA: Diagnosis not present

## 2021-01-25 DIAGNOSIS — I5022 Chronic systolic (congestive) heart failure: Secondary | ICD-10-CM

## 2021-01-25 DIAGNOSIS — I130 Hypertensive heart and chronic kidney disease with heart failure and stage 1 through stage 4 chronic kidney disease, or unspecified chronic kidney disease: Secondary | ICD-10-CM | POA: Insufficient documentation

## 2021-01-25 DIAGNOSIS — E1122 Type 2 diabetes mellitus with diabetic chronic kidney disease: Secondary | ICD-10-CM | POA: Diagnosis not present

## 2021-01-25 DIAGNOSIS — E785 Hyperlipidemia, unspecified: Secondary | ICD-10-CM | POA: Insufficient documentation

## 2021-01-25 DIAGNOSIS — I2510 Atherosclerotic heart disease of native coronary artery without angina pectoris: Secondary | ICD-10-CM | POA: Insufficient documentation

## 2021-01-25 DIAGNOSIS — Z952 Presence of prosthetic heart valve: Secondary | ICD-10-CM | POA: Insufficient documentation

## 2021-01-25 DIAGNOSIS — K219 Gastro-esophageal reflux disease without esophagitis: Secondary | ICD-10-CM | POA: Diagnosis not present

## 2021-01-25 DIAGNOSIS — N183 Chronic kidney disease, stage 3 unspecified: Secondary | ICD-10-CM | POA: Diagnosis not present

## 2021-01-25 DIAGNOSIS — J449 Chronic obstructive pulmonary disease, unspecified: Secondary | ICD-10-CM | POA: Diagnosis not present

## 2021-01-25 LAB — COMPREHENSIVE METABOLIC PANEL
ALT: 29 U/L (ref 0–44)
AST: 35 U/L (ref 15–41)
Albumin: 3.8 g/dL (ref 3.5–5.0)
Alkaline Phosphatase: 121 U/L (ref 38–126)
Anion gap: 14 (ref 5–15)
BUN: 63 mg/dL — ABNORMAL HIGH (ref 8–23)
CO2: 32 mmol/L (ref 22–32)
Calcium: 10.2 mg/dL (ref 8.9–10.3)
Chloride: 88 mmol/L — ABNORMAL LOW (ref 98–111)
Creatinine, Ser: 3.53 mg/dL — ABNORMAL HIGH (ref 0.44–1.00)
GFR, Estimated: 14 mL/min — ABNORMAL LOW (ref >60)
Glucose, Bld: 234 mg/dL — ABNORMAL HIGH (ref 70–99)
Potassium: 2.8 mmol/L — ABNORMAL LOW (ref 3.5–5.1)
Sodium: 134 mmol/L — ABNORMAL LOW (ref 135–145)
Total Bilirubin: 1 mg/dL (ref 0.3–1.2)
Total Protein: 9.8 g/dL — ABNORMAL HIGH (ref 6.5–8.1)

## 2021-01-25 LAB — CBC
HCT: 39 % (ref 36.0–46.0)
Hemoglobin: 12.2 g/dL (ref 12.0–15.0)
MCH: 26.9 pg (ref 26.0–34.0)
MCHC: 31.3 g/dL (ref 30.0–36.0)
MCV: 86.1 fL (ref 80.0–100.0)
Platelets: 173 10*3/uL (ref 150–400)
RBC: 4.53 MIL/uL (ref 3.87–5.11)
RDW: 13.5 % (ref 11.5–15.5)
WBC: 5.6 10*3/uL (ref 4.0–10.5)
nRBC: 0 % (ref 0.0–0.2)

## 2021-01-25 LAB — TSH: TSH: 2.159 u[IU]/mL (ref 0.350–4.500)

## 2021-01-25 MED ORDER — TORSEMIDE 20 MG PO TABS: 80.0000 mg | ORAL_TABLET | Freq: Every day | ORAL | 3 refills | Status: DC

## 2021-01-25 MED ORDER — AMIODARONE HCL 200 MG PO TABS: 200.0000 mg | ORAL_TABLET | Freq: Every day | ORAL | 6 refills | Status: DC

## 2021-01-25 NOTE — Progress Notes (Signed)
Advanced Heart Failure Clinic Note   Referring Physician: PCP: [name], NP PCP-Cardiologist: Buford Dresser, MD  AHFC: Dr. 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 Harrell Gave as an out-patient. She has a known history of HF with mildly reduced LVEF of 45-50% with cath in 2019 with mild, non-obstructive CAD. Also with history of difficult to control Afib with failed Tikosyn, later switched to amiodarone, dig, metopand apixaban for [facility]. Saw Dr. Harrell Gave in clinic on 11/22/20 where she was experiencing worsening SOB, weight gain, LE edema and chest pressure. She declined ER visit at that time. She was changed from lasix to torsemide for diuresis. Also planned for CV surgery evaluation for severe MR.   Admitted 12/19/20 with worsening HF.  LHC 5/19 non-obstructive CAD. RHC 12/22/20 with elevated filling pressures and low CI.   Underwent MV/TV repair with Maze on 5/12. Post-operatively, she struggled  with low co-ox, progressive renal failure and volume overload. Co-ox 48% while on milrinone 0.125. Cr 1.5. AHF team was consulted to assist w/ further management. Milrinone was increased and she was placed on lasix gtt. Post operative Echo showed LVEF 55% RV moderately HK. Moderate TR. No significant MR. No effusion. Hemodynamics improved w/ milrinone. Volume status improved as well as renal function. Suspected of having post-op ATN. Nephrology was also on board and assited w/ transition back to PO diuretics. She was able to wean off milrinone w/ stable co-ox. She was discharged on 5/28. D/w wt was 281 lb. SCr was 2.8. Nephrology recommended home on torsemide 60 mg daily. She was also in  Afib day of d/c. Recommendations were to consider DC-CV in several weeks after atrial irritation settles down from Maze.   She presents to clinic today for post hospital f/u. Here w/ daughter. Feels bad. Tearful. Multiple complains. Complains of exertional dyspnea, nausea/ vomiting, poor appetite and abdominal discomfort. Remains in Afib on EKG, HR 100. BP soft is soft but no orthostatic symptoms. Remains on amio 200 mg bid. Reports full compliance w/ Eliquis. No missed doses. ReDs clip elevated at 42%. Reports compliance w/ torsemide 60 daily but no significant increase in urination.         Review of Systems: [y] = yes, [ ]  = no   General: Weight gain [ ] ; Weight loss [ ] ; Anorexia [ Y]; Fatigue [ Y]; Fever [ ] ; Chills [ ] ; Weakness [Y ]  Cardiac: Chest pain/pressure [ ] ; Resting SOB [ ] ; Exertional SOB [Y ]; Orthopnea [ ] ; Pedal Edema [ ] ; Palpitations [ ] ; Syncope [ ] ; Presyncope [ ] ; Paroxysmal nocturnal dyspnea[ ]   Pulmonary: Cough [ ] ; Wheezing[ ] ; Hemoptysis[ ] ; Sputum [ ] ; Snoring [ ]   GI: Vomiting[Y ]; Dysphagia[ ] ; Melena[ ] ; Hematochezia [ ] ; Heartburn[ ] ; Abdominal pain [Y ]; Constipation [ ] ; Diarrhea [ ] ; BRBPR [ ]   GU: Hematuria[ ] ; Dysuria [ ] ; Nocturia[ ]   Vascular: Pain in legs with walking [ ] ; Pain in feet with lying flat [ ] ; Non-healing sores [ ] ; Stroke [ ] ; TIA [ ] ; Slurred speech [ ] ;  Neuro: Headaches[ ] ; Vertigo[ ] ; Seizures[ ] ; Paresthesias[ ] ;Blurred vision [ ] ; Diplopia [ ] ; Vision changes [ ]   Ortho/Skin: Arthritis [ ] ;  Joint pain [ ] ; Muscle pain [ ] ; Joint swelling [ ] ; Back Pain [ ] ; Rash [ ]   Psych: Depression[ Y]; Anxiety[Y ]  Heme: Bleeding problems [ ] ; Clotting disorders [ ] ; Anemia [ ]   Endocrine: Diabetes [ ] ; Thyroid dysfunction[ ]    Past Medical History:  Diagnosis Date  . Allergic rhinitis   . Arthritis   . Asthma   . Chest pain 12/27/2017  . Chronic diastolic CHF (congestive heart failure) ([place])   . COPD (chronic obstructive pulmonary disease)  ([place])   . Depression   . DM (diabetes mellitus) ([place])   . DVT (deep vein thrombosis) in pregnancy   . Dysrhythmia    atrial fibrilation  . GERD (gastroesophageal reflux disease)   . Headache(784.0)   . HTN (hypertension)   . Hyperlipidemia   . Obesity   . Pneumonia 04/2018   RIGHT LOBE  . Shortness of breath   . Sleep apnea    compliant with CPAP    Current Outpatient Medications  Medication Sig Dispense Refill  . apixaban (ELIQUIS) 5 MG TABS tablet Take 1 tablet (5 mg total) by mouth 2 (two) times daily. 180 tablet 1  . Blood Glucose Monitoring Suppl (ACCU-CHEK GUIDE ME) w/Device KIT Use as instructed to check blood sugar three times daily. E11.69 1 kit 0  . Blood Glucose Monitoring Suppl (ACCU-CHEK GUIDE) w/Device KIT USE AS INSTRUCTED TO CHECK BLOOD SUGAR THREE TIMES DAILY. E11.69    . DULoxetine (CYMBALTA) 60 MG capsule Take 1 capsule (60 mg total) by mouth daily. 90 capsule 3  . insulin aspart (NOVOLOG) 100 UNIT/ML injection Inject 12 Units into the skin 3 (three) times daily before meals. For blood sugars 0-199 give 0 units of insulin, 201-250 give 4 units, 251-300 give 6 units, 301-350 give 8 units, 351-400 give 10 units,> 400 give 12 units and call M.D. (Patient taking differently: Inject 5 Units into the skin 3 (three) times daily before meals. For blood sugars 0-199 give 0 units of insulin, 201-250 give 4 units, 251-300 give 6 units, 301-350 give 8 units, 351-400 give 7.) 10 mL 3  . LEVEMIR 100 UNIT/ML injection INJECT 0.4 MLS (40 UNITS TOTAL) INTO THE SKIN 2 (TWO) TIMES DAILY. (Patient taking differently: Inject 50 Units into the skin 2 (two) times daily.) 60 mL 1  . Potassium Chloride ER 20 MEQ TBCR Take 20 mEq by mouth daily. 30 tablet 6  . rosuvastatin (CRESTOR) 10 MG tablet Take 1 tablet (10 mg total) by mouth daily. 90 tablet 1  . TRUE METRIX BLOOD GLUCOSE TEST test strip USE AS INSTRUCTED TO CHECK BLOOD SUGAR DAILY. 100 strip 11  . Accu-Chek Softclix Lancets lancets Use  as instructed to check blood sugar three times daily. E11.69 100 each 12  . albuterol (VENTOLIN HFA) 108 (90 Base) MCG/ACT inhaler Inhale 2 puffs into the lungs every 4 (four) hours as needed for wheezing or shortness of breath.     . Alcohol Swabs (B-D SINGLE USE SWABS REGULAR) PADS     . amiodarone (PACERONE) 200 MG tablet Take 1 tablet (200 mg total) by mouth daily. 30 tablet 6  . aspirin-sod bicarb-citric acid (ALKA-SELTZER) 325 MG TBEF tablet Take 325 mg by mouth every 6 (six) hours as needed (indigestion).    Marland Kitchen EPINEPHrine 0.3 mg/0.3 mL IJ SOAJ injection Inject 0.3 mg into the muscle as needed for anaphylaxis. (Patient not taking: Reported on 01/25/2021) 1 each 1  . Multiple Vitamins-Minerals (MULTIVITAMIN WOMEN PO) Take 1 tablet  by mouth daily.     . nitroGLYCERIN (NITROSTAT) 0.4 MG SL tablet Place 1 tablet (0.4 mg total) under the tongue every 5 (five) minutes x 3 doses as needed for chest pain. 25 tablet 12  . oxyCODONE (OXY IR/ROXICODONE) 5 MG immediate release tablet Take 1 tablet (5 mg total) by mouth every 4 (four) hours as needed for severe pain. 30 tablet 0  . RESTASIS 0.05 % ophthalmic emulsion Place 1 drop into both eyes 2 (two) times daily.  12  . torsemide (DEMADEX) 20 MG tablet Take 4 tablets (80 mg total) by mouth daily. 120 tablet 3  . TRULICITY 1.5 MP/5.3IR SOPN INJECT 1.5 MG INTO THE SKIN ONCE A WEEK. 0.5 mL 1   No current facility-administered medications for this encounter.    Allergies  Allergen Reactions  . Sulfa Antibiotics Itching      Social History   Socioeconomic History  . Marital status: Significant Other    Spouse name: Not on file  . Number of children: 2  . Years of education: Not on file  . Highest education level: Some college, no degree  Occupational History  . Not on file  Tobacco Use  . Smoking status: Never Smoker  . Smokeless tobacco: Never Used  Vaping Use  . Vaping Use: Never used  Substance and Sexual Activity  . Alcohol use: No  .  Drug use: No  . Sexual activity: Not Currently  Other Topics Concern  . Not on file  Social History Narrative   Pt lives in [place] with spouse.  2 grown children.   Previously worked in [employer] at [employer].  Now on disability   Social Determinants of Health   Financial Resource Strain: Medium Risk  . Difficulty of Paying Living Expenses: Somewhat hard  Food Insecurity: No Food Insecurity  . Worried About Charity fundraiser in the Last Year: Never true  . Ran Out of Food in the Last Year: Never true  Transportation Needs: Unmet Transportation Needs  . Lack of Transportation (Medical): Yes  . Lack of Transportation (Non-Medical): No  Physical Activity: Not on file  Stress: Not on file  Social Connections: Not on file  Intimate Partner Violence: Not on file      Family History  Problem Relation Age of Onset  . Cancer Mother   . Hypertension Mother   . Cancer Father   . CAD Other   . Hypertension Sister   . Hypertension Brother     Vitals:   01/25/21 1037  BP: (!) 102/54  Pulse: (!) 110  SpO2: 98%  Weight: 114.6 kg (252 lb 9.6 oz)     PHYSICAL EXAM: ReDs Clip 42%  General:  Obese, anxious appearing. Tearful.  No respiratory difficulty HEENT: normal Neck: supple. JVD 8 cm. Carotids 2+ bilat; no bruits. No lymphadenopathy or thyromegaly appreciated. Cor: PMI nondisplaced. Irregularly irregular rhythm, mildly tachy rate. No rubs, gallops or murmurs. Sternal scar well healed no drainage  Lungs: decreased BS at the bases bilaterally . No wheezing  Abdomen: obese, soft, nontender, nondistended. No hepatosplenomegaly. No bruits or masses. Good bowel sounds. Extremities: no cyanosis, clubbing, rash, obese LEs. No pitting edema  Neuro: alert & oriented x 3, cranial nerves grossly intact. moves all 4 extremities w/o difficulty. Affect pleasant.  ECG: Atrial Fibrillation 100 bpm    ASSESSMENT & PLAN:  1. Valvular heart disease s/p MVR/TVR on 5/12 -Severe TRs/p  annuloplasty.Severe MRs/p annuloplastyon 12/30/20 - Echo 01/02/21 LVEF 55% RV moderately  HK. Moderate TR. No significant MR. No effusion. - sternotomy site well healed. Has f/u w/ Dr. Roxan Hockey on 6/14  2. Chronic Systolic HF with R>>L HF - TEE 4/22 EF 45-50% RV moderately HK  - Echo LVEF 55% RV moderately HK. Moderate TR. No significant MR. No effusion. - NYHA Class III. Volume overloaded, ReDs Clip 42%. Also complaining of n/v + abdominal pain - CMP to assess LFTs and renal function  - Increase Torsemide to 80 mg bid x 1 day, then 80 mg once daily - F/u BMP in 1 week  - No Arlyce Harman w/ CKD - No SGLT2i for now with recent GFR 19.   3.Longstanding Persistent Atrial Fibrillations/p Maze 5/15 - Persistent AF, HR 100 on EKG today  - Reduce Amio to 200 mg once daily (?if contributing to symptoms of n/v). Check TSH  + LFTs -off digoxin with recent AKI and EF 55%.  - On eliquis 5 mg twice a day. Reports full compliance. Plan DCCV after 6/18 (Eliquis started 5/18)   4. Stage III CKD  - baseline SCr 1.6-1.8 - Scr rose to 4.1 recent hospitalization- Likely post-op ATN. SCr down to 2.8 on d/c - CMP today    5. DM2 - management per PCP  - recent GFR too low for SGLT2i. Repeat labs today   6. Obesity/ Deconditioning Body mass index is 39.56 kg/m. - recommend cardiac rehab   F/u post DCCV, in 2-3 weeks.   Lyda Jester, PA-C 01/25/21

## 2021-01-25 NOTE — Progress Notes (Signed)
ReDS Vest / Clip - 01/25/21 1100      ReDS Vest / Clip   Station Marker A    Ruler Value 27    ReDS Value Range High volume overload    ReDS Actual Value 42    Anatomical Comments sittinin=g

## 2021-01-25 NOTE — Patient Instructions (Addendum)
DECREASE Amiodarone to 200mg  (1 tab) daily  TAKE Torsemide 80mg  (4 tabs) twice a day. (FOR TODAY ONLY)  THEN START Torsemide 80mg  (4 tabs) daily  Labs today We will only contact you if something comes back abnormal or we need to make some changes. Otherwise no news is good news!  Your physician recommends that you schedule a follow-up appointment in: 1-2 weeks after procedure  Please call office at [phone] option 2 if you have any questions or concerns.   At the [facility], you and your health needs are our priority. As part of our continuing mission to provide you with exceptional heart care, we have created designated Provider Care Teams. These Care Teams include your primary Cardiologist (physician) and Advanced Practice Providers (APPs- Physician Assistants and Nurse Practitioners) who all work together to provide you with the care you need, when you need it.   You may see any of the following providers on your designated Care Team at your next follow up: Marland Kitchen Dr Glori Bickers . Dr Loralie Champagne . Dr Vickki Muff . Darrick Grinder, NP . Lyda Jester, Faith . Audry Riles, PharmD   Please be sure to bring in all your medications bottles to every appointment.

## 2021-01-28 NOTE — Telephone Encounter (Signed)
Spoke to [facility], prescription is being resent.

## 2021-01-29 ENCOUNTER — Emergency Department (HOSPITAL_COMMUNITY): Payer: Medicare Other

## 2021-01-29 ENCOUNTER — Encounter (HOSPITAL_COMMUNITY): Payer: Self-pay

## 2021-01-29 ENCOUNTER — Other Ambulatory Visit: Payer: Self-pay

## 2021-01-29 ENCOUNTER — Inpatient Hospital Stay (HOSPITAL_COMMUNITY)
Admission: EM | Admit: 2021-01-29 | Discharge: 2021-02-02 | DRG: 286 | Disposition: A | Discharge status: Home Health | Payer: Medicare Other | Attending: Internal Medicine | Admitting: Internal Medicine

## 2021-01-29 DIAGNOSIS — R079 Chest pain, unspecified: Secondary | ICD-10-CM | POA: Diagnosis present

## 2021-01-29 DIAGNOSIS — K219 Gastro-esophageal reflux disease without esophagitis: Secondary | ICD-10-CM | POA: Diagnosis present

## 2021-01-29 DIAGNOSIS — Z794 Long term (current) use of insulin: Secondary | ICD-10-CM

## 2021-01-29 DIAGNOSIS — G4733 Obstructive sleep apnea (adult) (pediatric): Secondary | ICD-10-CM | POA: Diagnosis present

## 2021-01-29 DIAGNOSIS — Z7901 Long term (current) use of anticoagulants: Secondary | ICD-10-CM | POA: Diagnosis not present

## 2021-01-29 DIAGNOSIS — E876 Hypokalemia: Secondary | ICD-10-CM | POA: Diagnosis present

## 2021-01-29 DIAGNOSIS — E1169 Type 2 diabetes mellitus with other specified complication: Secondary | ICD-10-CM | POA: Diagnosis present

## 2021-01-29 DIAGNOSIS — E669 Obesity, unspecified: Secondary | ICD-10-CM | POA: Diagnosis not present

## 2021-01-29 DIAGNOSIS — I4819 Other persistent atrial fibrillation: Secondary | ICD-10-CM | POA: Diagnosis not present

## 2021-01-29 DIAGNOSIS — I130 Hypertensive heart and chronic kidney disease with heart failure and stage 1 through stage 4 chronic kidney disease, or unspecified chronic kidney disease: Secondary | ICD-10-CM | POA: Diagnosis not present

## 2021-01-29 DIAGNOSIS — M1990 Unspecified osteoarthritis, unspecified site: Secondary | ICD-10-CM | POA: Diagnosis present

## 2021-01-29 DIAGNOSIS — I2510 Atherosclerotic heart disease of native coronary artery without angina pectoris: Secondary | ICD-10-CM | POA: Diagnosis present

## 2021-01-29 DIAGNOSIS — I428 Other cardiomyopathies: Secondary | ICD-10-CM | POA: Diagnosis present

## 2021-01-29 DIAGNOSIS — I5033 Acute on chronic diastolic (congestive) heart failure: Secondary | ICD-10-CM

## 2021-01-29 DIAGNOSIS — I4821 Permanent atrial fibrillation: Secondary | ICD-10-CM | POA: Diagnosis not present

## 2021-01-29 DIAGNOSIS — E1122 Type 2 diabetes mellitus with diabetic chronic kidney disease: Secondary | ICD-10-CM | POA: Diagnosis not present

## 2021-01-29 DIAGNOSIS — N179 Acute kidney failure, unspecified: Secondary | ICD-10-CM | POA: Diagnosis present

## 2021-01-29 DIAGNOSIS — J449 Chronic obstructive pulmonary disease, unspecified: Secondary | ICD-10-CM | POA: Diagnosis not present

## 2021-01-29 DIAGNOSIS — Z9889 Other specified postprocedural states: Secondary | ICD-10-CM

## 2021-01-29 DIAGNOSIS — R0602 Shortness of breath: Secondary | ICD-10-CM | POA: Diagnosis present

## 2021-01-29 DIAGNOSIS — Z20822 Contact with and (suspected) exposure to covid-19: Secondary | ICD-10-CM | POA: Diagnosis present

## 2021-01-29 DIAGNOSIS — Z79899 Other long term (current) drug therapy: Secondary | ICD-10-CM

## 2021-01-29 DIAGNOSIS — I5023 Acute on chronic systolic (congestive) heart failure: Secondary | ICD-10-CM | POA: Diagnosis not present

## 2021-01-29 DIAGNOSIS — Z882 Allergy status to sulfonamides status: Secondary | ICD-10-CM

## 2021-01-29 DIAGNOSIS — I5043 Acute on chronic combined systolic (congestive) and diastolic (congestive) heart failure: Secondary | ICD-10-CM | POA: Diagnosis not present

## 2021-01-29 DIAGNOSIS — N1832 Chronic kidney disease, stage 3b: Secondary | ICD-10-CM | POA: Diagnosis not present

## 2021-01-29 DIAGNOSIS — I480 Paroxysmal atrial fibrillation: Secondary | ICD-10-CM | POA: Diagnosis present

## 2021-01-29 DIAGNOSIS — Z809 Family history of malignant neoplasm, unspecified: Secondary | ICD-10-CM

## 2021-01-29 DIAGNOSIS — N189 Chronic kidney disease, unspecified: Secondary | ICD-10-CM | POA: Diagnosis present

## 2021-01-29 DIAGNOSIS — I509 Heart failure, unspecified: Secondary | ICD-10-CM

## 2021-01-29 DIAGNOSIS — E785 Hyperlipidemia, unspecified: Secondary | ICD-10-CM | POA: Diagnosis present

## 2021-01-29 DIAGNOSIS — Z8249 Family history of ischemic heart disease and other diseases of the circulatory system: Secondary | ICD-10-CM

## 2021-01-29 DIAGNOSIS — I4811 Longstanding persistent atrial fibrillation: Secondary | ICD-10-CM

## 2021-01-29 DIAGNOSIS — I50813 Acute on chronic right heart failure: Secondary | ICD-10-CM | POA: Diagnosis present

## 2021-01-29 DIAGNOSIS — I5042 Chronic combined systolic (congestive) and diastolic (congestive) heart failure: Secondary | ICD-10-CM | POA: Diagnosis not present

## 2021-01-29 DIAGNOSIS — Z6841 Body Mass Index (BMI) 40.0 and over, adult: Secondary | ICD-10-CM

## 2021-01-29 DIAGNOSIS — Z952 Presence of prosthetic heart valve: Secondary | ICD-10-CM

## 2021-01-29 LAB — CBC WITH DIFFERENTIAL/PLATELET
Abs Immature Granulocytes: 0.02 10*3/uL (ref 0.00–0.07)
Basophils Absolute: 0 10*3/uL (ref 0.0–0.1)
Basophils Relative: 0 %
Eosinophils Absolute: 0.2 10*3/uL (ref 0.0–0.5)
Eosinophils Relative: 3 %
HCT: 36.5 % (ref 36.0–46.0)
Hemoglobin: 11.5 g/dL — ABNORMAL LOW (ref 12.0–15.0)
Immature Granulocytes: 0 %
Lymphocytes Relative: 27 %
Lymphs Abs: 1.8 10*3/uL (ref 0.7–4.0)
MCH: 27.1 pg (ref 26.0–34.0)
MCHC: 31.5 g/dL (ref 30.0–36.0)
MCV: 85.9 fL (ref 80.0–100.0)
Monocytes Absolute: 1.1 10*3/uL — ABNORMAL HIGH (ref 0.1–1.0)
Monocytes Relative: 16 %
Neutro Abs: 3.6 10*3/uL (ref 1.7–7.7)
Neutrophils Relative %: 54 %
Platelets: 239 10*3/uL (ref 150–400)
RBC: 4.25 MIL/uL (ref 3.87–5.11)
RDW: 13.6 % (ref 11.5–15.5)
WBC: 6.6 10*3/uL (ref 4.0–10.5)
nRBC: 0 % (ref 0.0–0.2)

## 2021-01-29 LAB — COMPREHENSIVE METABOLIC PANEL
ALT: 33 U/L (ref 0–44)
AST: 39 U/L (ref 15–41)
Albumin: 3.5 g/dL (ref 3.5–5.0)
Alkaline Phosphatase: 94 U/L (ref 38–126)
Anion gap: 13 (ref 5–15)
BUN: 57 mg/dL — ABNORMAL HIGH (ref 8–23)
CO2: 34 mmol/L — ABNORMAL HIGH (ref 22–32)
Calcium: 9.7 mg/dL (ref 8.9–10.3)
Chloride: 89 mmol/L — ABNORMAL LOW (ref 98–111)
Creatinine, Ser: 3.58 mg/dL — ABNORMAL HIGH (ref 0.44–1.00)
GFR, Estimated: 14 mL/min — ABNORMAL LOW (ref >60)
Glucose, Bld: 207 mg/dL — ABNORMAL HIGH (ref 70–99)
Potassium: 2.5 mmol/L — CL (ref 3.5–5.1)
Sodium: 136 mmol/L (ref 135–145)
Total Bilirubin: 0.8 mg/dL (ref 0.3–1.2)
Total Protein: 8.6 g/dL — ABNORMAL HIGH (ref 6.5–8.1)

## 2021-01-29 LAB — BRAIN NATRIURETIC PEPTIDE: B Natriuretic Peptide: 33.6 pg/mL (ref 0.0–100.0)

## 2021-01-29 LAB — RESP PANEL BY RT-PCR (FLU A&B, COVID) ARPGX2
Influenza A by PCR: NEGATIVE
Influenza B by PCR: NEGATIVE
SARS Coronavirus 2 by RT PCR: NEGATIVE

## 2021-01-29 LAB — GLUCOSE, CAPILLARY: Glucose-Capillary: 274 mg/dL — ABNORMAL HIGH (ref 70–99)

## 2021-01-29 LAB — LACTIC ACID, PLASMA
Lactic Acid, Venous: 1.3 mmol/L (ref 0.5–1.9)
Lactic Acid, Venous: 1.4 mmol/L (ref 0.5–1.9)

## 2021-01-29 LAB — MAGNESIUM: Magnesium: 1.9 mg/dL (ref 1.7–2.4)

## 2021-01-29 LAB — TROPONIN I (HIGH SENSITIVITY)
Troponin I (High Sensitivity): 20 ng/L — ABNORMAL HIGH (ref <18)
Troponin I (High Sensitivity): 18 ng/L — ABNORMAL HIGH (ref <18)

## 2021-01-29 MED ORDER — POTASSIUM CHLORIDE 10 MEQ/100ML IV SOLN
10.0000 meq | Freq: Once | INTRAVENOUS | Status: AC
  Administered 2021-01-29: 10 meq via INTRAVENOUS
  Filled 2021-01-29: qty 100

## 2021-01-29 MED ORDER — PANTOPRAZOLE SODIUM 40 MG PO TBEC
40.0000 mg | DELAYED_RELEASE_TABLET | Freq: Two times a day (BID) | ORAL | Status: DC
  Administered 2021-01-29 – 2021-02-02 (×7): 40 mg via ORAL
  Filled 2021-01-29 (×8): qty 1

## 2021-01-29 MED ORDER — INSULIN ASPART 100 UNIT/ML IJ SOLN
0.0000 [IU] | Freq: Three times a day (TID) | INTRAMUSCULAR | Status: DC
  Administered 2021-01-30 – 2021-02-01 (×6): 3 [IU] via SUBCUTANEOUS
  Administered 2021-02-02: 2 [IU] via SUBCUTANEOUS

## 2021-01-29 MED ORDER — FUROSEMIDE 10 MG/ML IJ SOLN
120.0000 mg | Freq: Once | INTRAVENOUS | Status: AC
  Administered 2021-01-29: 120 mg via INTRAVENOUS
  Filled 2021-01-29 (×2): qty 12

## 2021-01-29 MED ORDER — ROSUVASTATIN CALCIUM 5 MG PO TABS
10.0000 mg | ORAL_TABLET | Freq: Every day | ORAL | Status: DC
  Administered 2021-01-29 – 2021-01-31 (×3): 10 mg via ORAL
  Filled 2021-01-29 (×4): qty 2

## 2021-01-29 MED ORDER — METOLAZONE 5 MG PO TABS
5.0000 mg | ORAL_TABLET | Freq: Every day | ORAL | Status: DC
  Administered 2021-01-29 – 2021-01-31 (×3): 5 mg via ORAL
  Filled 2021-01-29 (×4): qty 1

## 2021-01-29 MED ORDER — APIXABAN 5 MG PO TABS
5.0000 mg | ORAL_TABLET | Freq: Two times a day (BID) | ORAL | Status: DC
  Administered 2021-01-29 – 2021-01-30 (×3): 5 mg via ORAL
  Filled 2021-01-29 (×4): qty 1

## 2021-01-29 MED ORDER — SODIUM CHLORIDE 0.9 % IV SOLN: 250.0000 mL | INTRAVENOUS | Status: DC | PRN

## 2021-01-29 MED ORDER — NITROGLYCERIN 0.4 MG SL SUBL: 0.4000 mg | SUBLINGUAL_TABLET | SUBLINGUAL | Status: DC | PRN

## 2021-01-29 MED ORDER — ADULT MULTIVITAMIN W/MINERALS CH
1.0000 | ORAL_TABLET | Freq: Every day | ORAL | Status: DC
  Administered 2021-01-29 – 2021-01-31 (×3): 1 via ORAL
  Filled 2021-01-29 (×4): qty 1

## 2021-01-29 MED ORDER — POTASSIUM CHLORIDE CRYS ER 20 MEQ PO TBCR
40.0000 meq | EXTENDED_RELEASE_TABLET | Freq: Once | ORAL | Status: AC
  Administered 2021-01-29: 40 meq via ORAL
  Filled 2021-01-29: qty 2

## 2021-01-29 MED ORDER — INSULIN DETEMIR 100 UNIT/ML ~~LOC~~ SOLN
50.0000 [IU] | Freq: Two times a day (BID) | SUBCUTANEOUS | Status: DC
  Administered 2021-01-29 – 2021-02-02 (×8): 50 [IU] via SUBCUTANEOUS
  Filled 2021-01-29 (×9): qty 0.5

## 2021-01-29 MED ORDER — NOVOLOG 100 UNIT/ML ~~LOC~~ SOLN: 5.0000 [IU] | Freq: Three times a day (TID) | SUBCUTANEOUS | Status: DC

## 2021-01-29 MED ORDER — ALBUTEROL SULFATE HFA 108 (90 BASE) MCG/ACT IN AERS
2.0000 | INHALATION_SPRAY | RESPIRATORY_TRACT | Status: DC | PRN
  Filled 2021-01-29: qty 6.7

## 2021-01-29 MED ORDER — MORPHINE SULFATE (PF) 2 MG/ML IV SOLN
2.0000 mg | Freq: Once | INTRAVENOUS | Status: AC
  Administered 2021-01-29: 2 mg via INTRAVENOUS
  Filled 2021-01-29: qty 1

## 2021-01-29 MED ORDER — MULTIVITAMIN WOMEN PO TABS: ORAL_TABLET | Freq: Every day | ORAL | Status: DC

## 2021-01-29 MED ORDER — OXYCODONE HCL 5 MG PO TABS
5.0000 mg | ORAL_TABLET | ORAL | Status: DC | PRN
  Administered 2021-01-31 – 2021-02-02 (×2): 5 mg via ORAL
  Filled 2021-01-29 (×2): qty 1

## 2021-01-29 MED ORDER — FUROSEMIDE 10 MG/ML IJ SOLN
120.0000 mg | Freq: Two times a day (BID) | INTRAVENOUS | Status: DC
  Administered 2021-01-29 – 2021-01-31 (×4): 120 mg via INTRAVENOUS
  Filled 2021-01-29: qty 10
  Filled 2021-01-29 (×2): qty 12
  Filled 2021-01-29 (×2): qty 10

## 2021-01-29 MED ORDER — SODIUM CHLORIDE 0.9% FLUSH
3.0000 mL | Freq: Two times a day (BID) | INTRAVENOUS | Status: DC
  Administered 2021-01-30 – 2021-02-02 (×5): 3 mL via INTRAVENOUS

## 2021-01-29 MED ORDER — CYCLOSPORINE 0.05 % OP EMUL
1.0000 [drp] | Freq: Two times a day (BID) | OPHTHALMIC | Status: DC
  Administered 2021-01-29 – 2021-02-02 (×8): 1 [drp] via OPHTHALMIC
  Filled 2021-01-29 (×9): qty 1

## 2021-01-29 MED ORDER — DULOXETINE HCL 60 MG PO CPEP
60.0000 mg | ORAL_CAPSULE | Freq: Every day | ORAL | Status: DC
  Administered 2021-01-29 – 2021-01-31 (×3): 60 mg via ORAL
  Filled 2021-01-29 (×4): qty 1

## 2021-01-29 MED ORDER — SODIUM CHLORIDE 0.9% FLUSH: 3.0000 mL | INTRAVENOUS | Status: DC | PRN

## 2021-01-29 MED ORDER — POTASSIUM CHLORIDE CRYS ER 10 MEQ PO TBCR
20.0000 meq | EXTENDED_RELEASE_TABLET | Freq: Every day | ORAL | Status: DC
  Filled 2021-01-29 (×2): qty 2

## 2021-01-29 MED ORDER — AMIODARONE HCL 200 MG PO TABS
200.0000 mg | ORAL_TABLET | Freq: Every day | ORAL | Status: DC
  Administered 2021-01-29 – 2021-01-31 (×3): 200 mg via ORAL
  Filled 2021-01-29 (×4): qty 1

## 2021-01-29 MED ORDER — METOPROLOL SUCCINATE ER 100 MG PO TB24
100.0000 mg | ORAL_TABLET | Freq: Every day | ORAL | Status: DC
  Administered 2021-01-29 – 2021-01-30 (×2): 100 mg via ORAL
  Filled 2021-01-29 (×2): qty 1

## 2021-01-29 MED ORDER — POTASSIUM CHLORIDE CRYS ER 20 MEQ PO TBCR
60.0000 meq | EXTENDED_RELEASE_TABLET | Freq: Once | ORAL | Status: AC
  Administered 2021-01-29: 60 meq via ORAL
  Filled 2021-01-29: qty 3

## 2021-01-29 MED ORDER — INSULIN ASPART 100 UNIT/ML ~~LOC~~ SOLN
5.0000 [IU] | Freq: Three times a day (TID) | SUBCUTANEOUS | Status: DC
  Administered 2021-01-30 – 2021-02-02 (×10): 5 [IU] via SUBCUTANEOUS

## 2021-01-29 MED ORDER — POTASSIUM CHLORIDE CRYS ER 20 MEQ PO TBCR: 40.0000 meq | EXTENDED_RELEASE_TABLET | Freq: Once | ORAL | Status: DC

## 2021-01-29 NOTE — H&P (Signed)
History and Physical    [name] YIR:[id number] DOB: 21-Aug-1956 DOA: 01/29/2021  PCP: [name], NP Consultants:  Dr. Harrell Gave: cardiology & CTS: Dr. Roxan Hockey Patient coming from:  Home - lives with daughter and 3 grandkids  Chief Complaint: chest pain and shortness of breath.   HPI: [name] is a 65 y.o. female with medical history significant of type 2 diabetes, chronic atrial fibrillation, chronic kidney disease, combined systolic and diastolic heart failure, OSA, recent mitral valve replacement surgery on May 11 by Dr. Roxan Hockey secondary to severe mitral regurg. She presented for a 2+ day history of chest pain and shortness of breath that she feels like she is drowning. Her chest pain is on the right anterior chest wall adjacent to her incision. Pain is constant, even at rest. No worse with exertion.  Pain rated as an 8/10. Pain does radiate down her right arm. Her shortness of breath is also new, worse with walking and laying down. Admits to orthopnea. She has had leg swelling and weight gain. Unsure how much. She has mild cough that is dry in nature. No fever/chills. She does have palpitations from her afib. She has some stomach pain in lower quadrants and some nausea at times. She has decreased appetite. She also feels like she has less urine output. She uses oxygen PRN at 2L via [place] at home.   ED Course: vitals: 126/77, heart rate 97, afebrile, respiratory rate 20, oxygen 100% on 2L [place].  Found to have hypokalemia and mildly elevated troponin 20 and then 18.  Chest x-ray normal.  AKI with creatinine of 3.58.  cardiology  consulted and we were asked to admit  Review of Systems: As per HPI; otherwise review of systems reviewed and negative.   Ambulatory Status:  Ambulates with cane and walker. No recent falls.     Past Medical History:  Diagnosis Date   Allergic rhinitis    Arthritis    Asthma    Chest pain 12/27/2017   Chronic diastolic CHF  (congestive heart failure) (HCC)    COPD (chronic obstructive pulmonary disease) (HCC)    Depression    DM (diabetes mellitus) ([place])    DVT (deep vein thrombosis) in pregnancy    Dysrhythmia    atrial fibrilation   GERD (gastroesophageal reflux disease)    Headache(784.0)    HTN (hypertension)    Hyperlipidemia    Obesity    Pneumonia 04/2018   RIGHT LOBE   Shortness of breath    Sleep apnea    compliant with CPAP    Past Surgical History:  Procedure Laterality Date   ABDOMINAL HYSTERECTOMY     BUBBLE STUDY  11/26/2020   Procedure: BUBBLE STUDY;  Surgeon: Buford Dresser, MD;  Location: [place];  Service: Cardiovascular;;   CARDIOVERSION N/A 05/06/2020   Procedure: CARDIOVERSION;  Surgeon: Buford Dresser, MD;  Location: [facility] ENDOSCOPY;  Service: Cardiovascular;  Laterality: N/A;   CARDIOVERSION N/A 11/04/2020   Procedure: CARDIOVERSION;  Surgeon: Lelon Perla, MD;  Location: [place];  Service: Cardiovascular;  Laterality: N/A;   CATARACT EXTRACTION, BILATERAL     CHOLECYSTECTOMY     COLONOSCOPY WITH PROPOFOL N/A 03/05/2015   Procedure: COLONOSCOPY WITH PROPOFOL;  Surgeon: Carol Ada, MD;  Location: [facility] ENDOSCOPY;  Service: Endoscopy;  Laterality: N/A;   DIAGNOSTIC LAPAROSCOPY     ingrown hallux Left    KNEE SURGERY     LEFT HEART CATH AND CORONARY ANGIOGRAPHY N/A 12/31/2017   Procedure: LEFT HEART CATH AND  CORONARY ANGIOGRAPHY;  Surgeon: Charolette Forward, MD;  Location: [place] [facility];  Service: Cardiovascular;  Laterality: N/A;   MAZE N/A 12/29/2020   Procedure: MAZE;  Surgeon: [name], MD;  Location: [place];  Service: Open Heart Surgery;  Laterality: N/A;   MITRAL VALVE REPAIR N/A 12/29/2020   Procedure: MITRAL VALVE REPAIR USING CARBOMEDICS ANNULOFLEX RING SIZE 30;  Surgeon: [name], MD;  Location: [place];  Service: Open Heart Surgery;  Laterality: N/A;   RIGHT HEART CATH N/A 12/22/2020   Procedure: RIGHT HEART CATH;   Surgeon: Larey Dresser, MD;  Location: [place] [facility];  Service: Cardiovascular;  Laterality: N/A;   TEE WITHOUT CARDIOVERSION N/A 11/26/2020   Procedure: TRANSESOPHAGEAL ECHOCARDIOGRAM (TEE);  Surgeon: Buford Dresser, MD;  Location: [facility] ENDOSCOPY;  Service: Cardiovascular;  Laterality: N/A;   TEE WITHOUT CARDIOVERSION N/A 12/29/2020   Procedure: TRANSESOPHAGEAL ECHOCARDIOGRAM (TEE);  Surgeon: [name], MD;  Location: [place];  Service: Open Heart Surgery;  Laterality: N/A;   TRICUSPID VALVE REPLACEMENT N/A 12/29/2020   Procedure: 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 RING SIZE 34;  Surgeon: [name], MD;  Location: [place];  Service: Open Heart Surgery;  Laterality: N/A;    Social History   Socioeconomic History   Marital status: Significant Other    Spouse name: Not on file   Number of children: 2   Years of education: Not on file   Highest education level: Some college, no degree  Occupational History   Not on file  Tobacco Use   Smoking status: Never   Smokeless tobacco: Never  Vaping Use   Vaping Use: Never used  Substance and Sexual Activity   Alcohol use: No   Drug use: No   Sexual activity: Not Currently  Other Topics Concern   Not on file  Social History Narrative   Pt lives in [place] with spouse.  2 grown children.   Previously worked in [employer] at [employer].  Now on disability   Social Determinants of Health   Financial Resource Strain: Medium Risk   Difficulty of Paying Living Expenses: Somewhat hard  Food Insecurity: No Food Insecurity   Worried About Charity fundraiser in the Last Year: Never true   Ran Out of Food in the Last Year: Never true  Transportation Needs: Unmet Transportation Needs   Lack of Transportation (Medical): Yes   Lack of Transportation (Non-Medical): No  Physical Activity: Not on file  Stress: Not on file  Social Connections: Not on file  Intimate Partner Violence: Not on file     Allergies  Allergen Reactions   Sulfa Antibiotics Itching    Family History  Problem Relation Age of Onset   Cancer Mother    Hypertension Mother    Cancer Father    CAD Other    Hypertension Sister    Hypertension Brother     Prior to Admission medications   Medication Sig Start Date End Date Taking? Authorizing Provider  Accu-Chek Softclix Lancets lancets Use as instructed to check blood sugar three times daily. E11.69 Patient taking differently: 1 each by Other route See admin instructions. Use as instructed to check blood sugar three times daily. E11.69 04/21/20  Yes Newlin, Charlane Ferretti, MD  albuterol (VENTOLIN HFA) 108 (90 Base) MCG/ACT inhaler Inhale 2 puffs into the lungs every 4 (four) hours as needed for wheezing or shortness of breath.  08/15/19  Yes [provider]  Alcohol Swabs (B-D SINGLE  USE SWABS REGULAR) PADS 1 each by Other route daily. 02/19/19  Yes [provider]  amiodarone (PACERONE) 200 MG tablet Take 1 tablet (200 mg total) by mouth daily. 01/25/21  Yes Lyda Jester M, PA-C  apixaban (ELIQUIS) 5 MG TABS tablet Take 1 tablet (5 mg total) by mouth 2 (two) times daily. 11/09/20  Yes Buford Dresser, MD  B-D ULTRAFINE III SHORT PEN 31G X 8 MM MISC 1 each by Other route in the morning, at noon, in the evening, and at bedtime. 11/18/20  Yes [provider]  Blood Glucose Monitoring Suppl (ACCU-CHEK GUIDE ME) w/Device KIT Use as instructed to check blood sugar three times daily. E11.69 04/21/20  Yes Charlott Rakes, MD  Blood Glucose Monitoring Suppl (ACCU-CHEK GUIDE) w/Device KIT 1 each by Other route in the morning, at noon, and at bedtime. 04/21/20  Yes [provider]  DULoxetine (CYMBALTA) 60 MG capsule Take 1 capsule (60 mg total) by mouth daily. 09/24/20  Yes [name], NP  EPINEPHrine 0.3 mg/0.3 mL IJ SOAJ injection Inject 0.3 mg into the muscle as needed for anaphylaxis. 06/25/20  Yes [name], NP   furosemide (LASIX) 40 MG tablet Take 40 mg by mouth 2 (two) times daily. 01/01/21  Yes [provider]  insulin aspart (NOVOLOG) 100 UNIT/ML injection Inject 12 Units into the skin 3 (three) times daily before meals. For blood sugars 0-199 give 0 units of insulin, 201-250 give 4 units, 251-300 give 6 units, 301-350 give 8 units, 351-400 give 10 units,> 400 give 12 units and call M.D. Patient taking differently: Inject 5 Units into the skin 3 (three) times daily before meals. For blood sugars 0-199 give 0 units of insulin, 201-250 give 4 units, 251-300 give 6 units, 301-350 give 8 units, 351-400 give 7. 06/24/20  Yes Edwards, Michelle P, NP  LEVEMIR 100 UNIT/ML injection INJECT 0.4 MLS (40 UNITS TOTAL) INTO THE SKIN 2 (TWO) TIMES DAILY. Patient taking differently: Inject 50 Units into the skin 2 (two) times daily. 12/01/20  Yes [name], NP  metolazone (ZAROXOLYN) 5 MG tablet Take 5 mg by mouth daily. 01/05/21  Yes [provider]  metoprolol succinate (TOPROL-XL) 100 MG 24 hr tablet Take 100 mg by mouth daily. 01/18/21  Yes [provider]  Multiple Vitamins-Minerals (MULTIVITAMIN WOMEN PO) Take 1 tablet by mouth daily.    Yes [provider]  nitroGLYCERIN (NITROSTAT) 0.4 MG SL tablet Place 1 tablet (0.4 mg total) under the tongue every 5 (five) minutes x 3 doses as needed for chest pain. 01/08/14  Yes Charolette Forward, MD  oxyCODONE (OXY IR/ROXICODONE) 5 MG immediate release tablet Take 1 tablet (5 mg total) by mouth every 4 (four) hours as needed for severe pain. 01/12/21  Yes Barrett, Erin R, PA-C  pantoprazole (PROTONIX) 40 MG tablet Take 40 mg by mouth 2 (two) times daily. 12/27/20  Yes [provider]  Potassium Chloride ER 20 MEQ TBCR Take 20 mEq by mouth daily. 11/05/20  Yes Baldwin Jamaica, PA-C  RESTASIS 0.05 % ophthalmic emulsion Place 1 drop into both eyes 2 (two) times daily. 11/19/17  Yes [provider]  rosuvastatin (CRESTOR) 10 MG  tablet Take 1 tablet (10 mg total) by mouth daily. 09/24/20  Yes [name], NP  torsemide (DEMADEX) 20 MG tablet Take 4 tablets (80 mg total) by mouth daily. 01/25/21  Yes Simmons, Brittainy M, PA-C  TRUE METRIX BLOOD GLUCOSE TEST test strip USE AS INSTRUCTED TO  CHECK BLOOD SUGAR DAILY. Patient taking differently: 1 each by Other route daily. 07/21/20  Yes Hoy Register, MD  TRULICITY 1.5 MG/0.5ML SOPN INJECT 1.5 MG INTO THE SKIN ONCE A WEEK. 12/05/20  Yes Grayce Sessions, NP    Physical Exam: Vitals:   01/29/21 1430 01/29/21 1500 01/29/21 1700 01/29/21 2008  BP: 118/63 126/77 (P) 127/75 109/68  Pulse: 94 97 (P) 97 84  Resp: 16 20 (P) 20 18  Temp:  (P) 98 F (36.7 C) (P) 97.8 F (36.6 C) 98.2 F (36.8 C)  TempSrc:  (P) Oral (P) Oral Oral  SpO2: 100% 100% (P) 99% 99%  Weight:   (P) 113.4 kg   Height:   (P) 5\' 6"  (1.676 m)      General:  Appears calm and comfortable and is in NAD Eyes:  PERRL, EOMI, normal lids, iris ENT:  grossly normal hearing, lips & tongue, mmm; appropriate dentition Neck:  no LAD, masses or thyromegaly; no carotid bruits Cardiovascular:  irregulary, irregulary, no m/r/g. + non pitting bilateral lower leg edema Respiratory:   CTA bilaterally with no wheezes/rales/rhonchi.  Normal respiratory effort. Abdomen:  soft, NT, ND, NABS Back:   normal alignment, no CVAT Skin:  no rash or induration seen on limited exam Musculoskeletal:  grossly normal tone BUE/BLE, good ROM, no bony abnormality Lower extremity:  Limited foot exam with no ulcerations.  2+ distal pulses. Psychiatric:  grossly normal mood and affect, speech fluent and appropriate, AOx3 Neurologic:  CN 2-12 grossly intact, moves all extremities in coordinated fashion, sensation intact    Radiological Exams on Admission: Independently reviewed - see discussion in A/P where applicable  DG Chest Portable 1 View  Result Date: 01/29/2021 CLINICAL DATA:  Chest pain.  Recent open heart surgery  EXAM: PORTABLE CHEST 1 VIEW COMPARISON:  01/09/2021 FINDINGS: Cardiomegaly. Prior mitral and tricuspid valve repair. Left atrial clipping. There is no edema, consolidation, effusion, or pneumothorax. IMPRESSION: Stable postoperative chest.  No evidence of active disease. Electronically Signed   By: 01/11/2021 M.D.   On: 01/29/2021 11:32    EKG: Independently reviewed.  Atrial fibrillatino  105; nonspecific ST changes with no evidence of acute ischemia. Similar to EKG from 01/25/21.    Labs on Admission: I have personally reviewed the available labs and imaging studies at the time of the admission.  Pertinent labs:  Potassium: 2.5 Glucose: 207 Creatinine: 3.58 and BUN: 57  baseline: 2.5-2.8 Bnp: wnl Troponin: 20/18   Assessment/Plan    Acute exacerbation of CHF (congestive heart failure) [facility]) -cardiology consulted and has seen patient.  Failed attempts to diurese as an outpatient -Recently seen in clinic on 01/25/2021 and reported not feeling well.  Instructed to increase her torsemide to 80 mg twice daily x1 day and then 80 mg once daily also decreased her amiodarone down to 200 mg daily -Strict I's and O's and daily weights -Done on 01/02/2021 that showed EF of 60 to 65% with normal left ventricular function.  Right ventricular systolic function normal.   -cardiology has ordered IV Lasix and will follow response -no ACE/ARB/spiro with CKD -follow per cardiology   Active Problems:    Atrial fibrillation (HCC) -CHA2DS2/VAS: 4 -Anticoagulated on Eliquis continue while inpatient follow creatinine closely -Amiodarone 200 mg daily -Scheduled for cardioversion on 02/05/21.  ? if A. fib contributing to overload -per cardiology recommendation     Chest pain -Chest wall pain from incision, continue with pain meds prn. No evidence of ischemia.  -tele  Hypokalemia -Repleted via IV and oral today -Continue home potassium  -magnesium pending -repeat labs in am    S/P mitral valve  repair on 01/06/21 -incision well healed.  -continue with oxycodone for pain. PMP webiste verified.      Diabetes mellitus type 2 in obese ([place]) -Continue home insulin -continue trulicity on d/c  -M6K pending -SSI    Acute kidney injury superimposed on CKD ([place]) Baseline creatinine: 1.6-1.8, but 2.8 on recent discharge in 5/22 -getting diuresed, will need to watch closely -hold nephrotoxic drugs.  -on cymbalta $RemoveBefo'60mg'rTPozXqlXiB$ , could consider decreasing down to due GFR.     OSA (obstructive sleep apnea) cpap     Body mass index is 40.35 kg/m (pended).  Note: This patient has been tested and is negative for the novel coronavirus COVID-19.    Level of care: Telemetry Cardiac DVT prophylaxis:  eliquis  Code Status:  Full - confirmed with patient Family Communication: None present. Asked that I call her sister, Pamala Hurry and discuss plan of care. Continue with updates: [phone] Disposition Plan:  The patient is from: home    Patient is currently: acutely ill Consults called: cardiology by ED, Dr. Crosby Oyster  Admission status:  inpatient    Orma Flaming MD [facility]   How to contact the [facility] Attending or Consulting provider [place] or covering provider during after hours [place], for this patient?  Check the care team in [facility] and look for a) attending/consulting [facility] provider listed and b) the [facility] team listed Log into [email or web address] and use [facility] universal password to access. If you do not have the password, please contact the hospital operator. Locate the [facility] provider you are looking for under [facility] and page to a number that you can be directly reached. If you still have difficulty reaching the provider, please page the [facility] (Director on Call) for the Hospitalists listed on amion for assistance.   01/29/2021, 9:33 PM

## 2021-01-29 NOTE — Consult Note (Addendum)
Cardiology Consultation:   Patient ID: [place] MRN: [id number]; DOB: [birth year]  Admit date: 01/29/2021 Date of Consult: 01/29/2021  PCP:  [name], NP   [facility] Providers Cardiologist:  Buford Dresser, MD   {  Patient Profile:   Carleigh Buccieri is a 65 y.o. 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 Jeanell Sparrow.  History of Present Illness:   Ms. Finchum is a 65 year old female with past medical history noted above.  She has historically been followed by Dr. Harrell Gave as an outpatient.  She has a history of heart failure with mildly reduced LVEF of 45 to 50% with cath in 2019 showing mild nonobstructive CAD.  Also has a history of atrial fibrillation which has been difficult to control.  She has failed Tikosyn was later switched to amiodarone, digoxin along with metoprolol and apixaban.  She was seen by Dr. Harrell Gave 11/2020 when she is experiencing worsening shortness of breath, weight gain, lower extremity edema and chest pain.  At that office visit declined to go to the ER.  She was changed from Lasix to torsemide.  Was planned for outpatient surgery evaluation for her severe MR.  Admitted 12/19/2020 with worsening heart failure.  Cath showed elevated filling pressures and low cardiac index.  She underwent MV/TV repair with maze on 12/30/2020.  Postoperative course was complicated with low output, progressive renal failure and volume overload.  Advanced heart failure team was consulted to assist with further management during that admission.  She was placed on milrinone and Lasix drip.  Postoperative echocardiogram showed an EF of 55% with moderately enlarged RV.  She improved with milrinone.  Nephrology was  consulted who assisted with transition from IV to p.o. diuretics.  Discharged on 5/28 with a discharge weight of 281 pounds.  Her serum creatinine was noted at 2.8.  Nephrology recommended torsemide 60 mg daily.  Atrial fibrillation, rate controlled on the day of discharge.  Recommendations were to consider outpatient cardioversion several weeks after discharge.  She was seen in the office on 01/25/2021 with Ellen Henri, [place].  Visit she noted feeling terrible with multiple complaints.  Continue to have exertional dyspnea, nausea, vomiting poor appetite and abdominal discomfort.  She remained in atrial fibrillation with heart rate of 100 bpm on EKG.  Reported compliance with her Eliquis.  ReDs vest clip elevated at 42%.  She was instructed to increase her torsemide to 80 mg twice daily x1 day and then 80 mg once daily.  There was question whether her amiodarone was contributing to her nausea and vomiting therefore was instructed to reduce to 200 mg daily.  Presented to the ED on 6/11 with complaints of worsening shortness of breath and chest pain.  States since being seen in the office on 6/7 she is continued to have worsening shortness of breath, orthopnea and lower extremity edema.  She is also had sharp right-sided chest pain.  States she has not had much increase in her urine output despite increasing her torsemide as instructed to do.   Labs showed sodium 136, potassium 2.5, creatinine 3.5, BNP 33, high-sensitivity troponin 20, WBC 6.6, hemoglobin 11.5.  Chest x-ray with no overt edema.  EKG, atrial fibrillation 105 bpm.  Was given sublingual nitro with EMS which did not help her  chest pain but improved her breathing.  At the time of interview she reports ongoing chest discomfort.  States her breathing feels stable while sitting up in bed but she is unable to lay back without feeling like she is suffocating.     Past Medical History:  Diagnosis Date   Allergic rhinitis    Arthritis    Asthma     Chest pain 12/27/2017   Chronic diastolic CHF (congestive heart failure) (HCC)    COPD (chronic obstructive pulmonary disease) (HCC)    Depression    DM (diabetes mellitus) ([place])    DVT (deep vein thrombosis) in pregnancy    Dysrhythmia    atrial fibrilation   GERD (gastroesophageal reflux disease)    Headache(784.0)    HTN (hypertension)    Hyperlipidemia    Obesity    Pneumonia 04/2018   RIGHT LOBE   Shortness of breath    Sleep apnea    compliant with CPAP    Past Surgical History:  Procedure Laterality Date   ABDOMINAL HYSTERECTOMY     BUBBLE STUDY  11/26/2020   Procedure: BUBBLE STUDY;  Surgeon: Buford Dresser, MD;  Location: [place];  Service: Cardiovascular;;   CARDIOVERSION N/A 05/06/2020   Procedure: CARDIOVERSION;  Surgeon: Buford Dresser, MD;  Location: [facility] ENDOSCOPY;  Service: Cardiovascular;  Laterality: N/A;   CARDIOVERSION N/A 11/04/2020   Procedure: CARDIOVERSION;  Surgeon: Lelon Perla, MD;  Location: [place];  Service: Cardiovascular;  Laterality: N/A;   CATARACT EXTRACTION, BILATERAL     CHOLECYSTECTOMY     COLONOSCOPY WITH PROPOFOL N/A 03/05/2015   Procedure: COLONOSCOPY WITH PROPOFOL;  Surgeon: Carol Ada, MD;  Location: [facility] ENDOSCOPY;  Service: Endoscopy;  Laterality: N/A;   DIAGNOSTIC LAPAROSCOPY     ingrown hallux Left    KNEE SURGERY     LEFT HEART CATH AND CORONARY ANGIOGRAPHY N/A 12/31/2017   Procedure: LEFT HEART CATH AND CORONARY ANGIOGRAPHY;  Surgeon: Charolette Forward, MD;  Location: [place] CV LAB;  Service: Cardiovascular;  Laterality: N/A;   MAZE N/A 12/29/2020   Procedure: MAZE;  Surgeon: [name], MD;  Location: [place];  Service: Open Heart Surgery;  Laterality: N/A;   MITRAL VALVE REPAIR N/A 12/29/2020   Procedure: MITRAL VALVE REPAIR USING CARBOMEDICS ANNULOFLEX RING SIZE 30;  Surgeon: [name], MD;  Location: [place];  Service: Open Heart Surgery;  Laterality: N/A;   RIGHT HEART CATH N/A  12/22/2020   Procedure: RIGHT HEART CATH;  Surgeon: Larey Dresser, MD;  Location: [place] [facility];  Service: Cardiovascular;  Laterality: N/A;   TEE WITHOUT CARDIOVERSION N/A 11/26/2020   Procedure: TRANSESOPHAGEAL ECHOCARDIOGRAM (TEE);  Surgeon: Buford Dresser, MD;  Location: [facility] ENDOSCOPY;  Service: Cardiovascular;  Laterality: N/A;   TEE WITHOUT CARDIOVERSION N/A 12/29/2020   Procedure: TRANSESOPHAGEAL ECHOCARDIOGRAM (TEE);  Surgeon: [name], MD;  Location: [place];  Service: Open Heart Surgery;  Laterality: N/A;   TRICUSPID VALVE REPLACEMENT N/A 12/29/2020   Procedure: 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 RING SIZE 34;  Surgeon: [name], MD;  Location: [place];  Service: Open Heart Surgery;  Laterality: N/A;     Home Medications:  Prior to Admission medications   Medication Sig Start Date End Date Taking? Authorizing Provider  Accu-Chek Softclix Lancets lancets Use as instructed to check blood sugar three times daily. E11.69 Patient taking differently: 1 each by Other route See admin instructions. Use as instructed to check blood sugar three times daily. E11.69  04/21/20   Charlott Rakes, MD  albuterol (VENTOLIN HFA) 108 (90 Base) MCG/ACT inhaler Inhale 2 puffs into the lungs every 4 (four) hours as needed for wheezing or shortness of breath.  08/15/19   [provider]  Alcohol Swabs (B-D SINGLE USE SWABS REGULAR) PADS 1 each by Other route daily. 02/19/19   [provider]  amiodarone (PACERONE) 200 MG tablet Take 1 tablet (200 mg total) by mouth daily. 01/25/21   Lyda Jester M, PA-C  apixaban (ELIQUIS) 5 MG TABS tablet Take 1 tablet (5 mg total) by mouth 2 (two) times daily. 11/09/20   Buford Dresser, MD  aspirin-sod bicarb-citric acid (ALKA-SELTZER) 325 MG TBEF tablet Take 325 mg by mouth every 6 (six) hours as needed (indigestion).    [provider]  B-D ULTRAFINE III SHORT PEN 31G X 8 MM MISC 1 each by Other  route in the morning, at noon, in the evening, and at bedtime. 11/18/20   [provider]  Blood Glucose Monitoring Suppl (ACCU-CHEK GUIDE ME) w/Device KIT Use as instructed to check blood sugar three times daily. E11.69 04/21/20   Charlott Rakes, MD  Blood Glucose Monitoring Suppl (ACCU-CHEK GUIDE) w/Device KIT 1 each by Other route in the morning, at noon, and at bedtime. 04/21/20   [provider]  DULoxetine (CYMBALTA) 60 MG capsule Take 1 capsule (60 mg total) by mouth daily. 09/24/20   [name], NP  EPINEPHrine 0.3 mg/0.3 mL IJ SOAJ injection Inject 0.3 mg into the muscle as needed for anaphylaxis. Patient not taking: Reported on 01/25/2021 06/25/20   [name], NP  furosemide (LASIX) 40 MG tablet Take 40 mg by mouth 2 (two) times daily. 01/01/21   [provider]  insulin aspart (NOVOLOG) 100 UNIT/ML injection Inject 12 Units into the skin 3 (three) times daily before meals. For blood sugars 0-199 give 0 units of insulin, 201-250 give 4 units, 251-300 give 6 units, 301-350 give 8 units, 351-400 give 10 units,> 400 give 12 units and call M.D. Patient taking differently: Inject 5 Units into the skin 3 (three) times daily before meals. For blood sugars 0-199 give 0 units of insulin, 201-250 give 4 units, 251-300 give 6 units, 301-350 give 8 units, 351-400 give 7. 06/24/20   Edwards, Milford Cage, NP  LEVEMIR 100 UNIT/ML injection INJECT 0.4 MLS (40 UNITS TOTAL) INTO THE SKIN 2 (TWO) TIMES DAILY. Patient taking differently: Inject 50 Units into the skin 2 (two) times daily. 12/01/20   [name], NP  metolazone (ZAROXOLYN) 5 MG tablet Take 5 mg by mouth daily. 01/05/21   [provider]  metoprolol succinate (TOPROL-XL) 100 MG 24 hr tablet Take 100 mg by mouth daily. 01/18/21   [provider]  Multiple Vitamins-Minerals (MULTIVITAMIN WOMEN PO) Take 1 tablet by mouth daily.     [provider]  nitroGLYCERIN (NITROSTAT) 0.4 MG SL  tablet Place 1 tablet (0.4 mg total) under the tongue every 5 (five) minutes x 3 doses as needed for chest pain. 01/08/14   Charolette Forward, MD  oxyCODONE (OXY IR/ROXICODONE) 5 MG immediate release tablet Take 1 tablet (5 mg total) by mouth every 4 (four) hours as needed for severe pain. 01/12/21   Barrett, Erin R, PA-C  pantoprazole (PROTONIX) 40 MG tablet Take 40 mg by mouth 2 (two) times daily. 12/27/20   [provider]  Potassium Chloride ER 20 MEQ TBCR Take 20 mEq by mouth daily. 11/05/20   Baldwin Jamaica, PA-C  RESTASIS 0.05 % ophthalmic emulsion Place 1 drop into both eyes 2 (two) times daily. 11/19/17   [provider]  rosuvastatin (CRESTOR) 10 MG tablet Take 1 tablet (10 mg total) by mouth daily. 09/24/20   [name], NP  torsemide (DEMADEX) 20 MG tablet Take 4 tablets (80 mg total) by mouth daily. 01/25/21   Lyda Jester M, PA-C  TRUE METRIX BLOOD GLUCOSE TEST test strip USE AS INSTRUCTED TO CHECK BLOOD SUGAR DAILY. Patient taking differently: 1 each by Other route daily. 07/21/20   Charlott Rakes, MD  TRULICITY 1.5 VZ/8.5YI SOPN INJECT 1.5 MG INTO THE SKIN ONCE A WEEK. 12/05/20   [name], NP    Inpatient Medications: Scheduled Meds: REM Continuous Infusions: REM  PRN Meds:   Allergies:    Allergies  Allergen Reactions   Sulfa Antibiotics Itching    Social History:   Social History   Socioeconomic History   Marital status: Significant Other    Spouse name: Not on file   Number of children: 2   Years of education: Not on file   Highest education level: Some college, no degree  Occupational History   Not on file  Tobacco Use   Smoking status: Never   Smokeless tobacco: Never  Vaping Use   Vaping Use: Never used  Substance and Sexual Activity   Alcohol use: No   Drug use: No   Sexual activity: Not Currently  Other Topics Concern   Not on file  Social History Narrative   Pt lives in [place] with spouse.  2 grown  children.   Previously worked in [employer] at [employer].  Now on disability   Social Determinants of Health   Financial Resource Strain: Medium Risk   Difficulty of Paying Living Expenses: Somewhat hard  Food Insecurity: No Food Insecurity   Worried About Charity fundraiser in the Last Year: Never true   Ran Out of Food in the Last Year: Never true  Transportation Needs: Unmet Transportation Needs   Lack of Transportation (Medical): Yes   Lack of Transportation (Non-Medical): No  Physical Activity: Not on file  Stress: Not on file  Social Connections: Not on file  Intimate Partner Violence: Not on file    Family History:    Family History  Problem Relation Age of Onset   Cancer Mother    Hypertension Mother    Cancer Father    CAD Other    Hypertension Sister    Hypertension Brother      ROS:  Please see the history of present illness.   All other ROS reviewed and negative.     Physical Exam/Data:   Vitals:   01/29/21 1330 01/29/21 1400 01/29/21 1430 01/29/21 1500  BP: 106/72 (!) 131/120 118/63 126/77  Pulse: 96 90 94 97  Resp: _0 Temp:      TempSrc:      SpO2: 100% 100% 100% 100%   No intake or output data in the 24 hours ending 01/29/21 1520 Last 3 Weights 01/25/2021 01/12/2021 01/11/2021  Weight (lbs) 252 lb 9.6 oz 282 lb 8 oz 281 lb 12 oz  Weight (kg) 114.579 kg 128.141 kg 127.8 kg  Some encounter information is confidential and restricted. Go to Review Flowsheets activity to see all data.     There is no height or weight on file to calculate BMI.  General:  AAF, sitting up in bed. Wearing [place] HEENT: normal Lymph: no adenopathy Neck: +  JVD to jaw  Endocrine:  No thryomegaly Vascular: No carotid bruits Cardiac:  normal S1, S2; Irreg Irreg; no murmur  Lungs:  diminished bilateral Abd: soft, nontender, no hepatomegaly  Ext: no edema Musculoskeletal:  No deformities, BUE and BLE strength normal and equal Skin: warm and dry  Neuro:  CNs 2-12 intact,  no focal abnormalities noted Psych:  Normal affect   EKG:  The EKG was personally reviewed and demonstrates:  Afib 105bpm Telemetry:  Telemetry was personally reviewed and demonstrates:  Afib rates 90-110s  Relevant CV Studies:  Echo: 01/02/21  IMPRESSIONS     1. No subcostal views; Elevated mean gradient across aortic valve (mean  gradient 13 mmHg) but visually valve opens well; S/P MV repair with mean  gradient 7 mmHg and trace MR; S/P TV repair with moderate TR.   2. Left ventricular ejection fraction, by estimation, is 60 to 65%. The  left ventricle has normal function. The left ventricle has no regional  wall motion abnormalities. There is moderate left ventricular hypertrophy.  There is the interventricular  septum is flattened in systole and diastole, consistent with right  ventricular pressure and volume overload.   3. Right ventricular systolic function is normal. The right ventricular  size is mildly enlarged.   4. Left atrial size was moderately dilated.   5. Right atrial size was mildly dilated.   6. The mitral valve has been repaired/replaced. Trivial mitral valve  regurgitation. No evidence of mitral stenosis.   7. The tricuspid valve is has been repaired/replaced. Tricuspid valve  regurgitation is moderate.   8. The aortic valve is tricuspid. Aortic valve regurgitation is not  visualized. No aortic stenosis is present.   FINDINGS   Left Ventricle: Left ventricular ejection fraction, by estimation, is 60  to 65%. The left ventricle has normal function. The left ventricle has no  regional wall motion abnormalities. The left ventricular internal cavity  size was normal in size. There is   moderate left ventricular hypertrophy. The interventricular septum is  flattened in systole and diastole, consistent with right ventricular  pressure and volume overload.   Right Ventricle: The right ventricular size is mildly enlarged. Right  ventricular systolic function is  normal.   Left Atrium: Left atrial size was moderately dilated.   Right Atrium: Right atrial size was mildly dilated.   Pericardium: There is no evidence of pericardial effusion.   Mitral Valve: The mitral valve has been repaired/replaced. Trivial mitral  valve regurgitation. There is a prosthetic annuloplasty ring present in  the mitral position. No evidence of mitral valve stenosis. MV peak  gradient, 21.2 mmHg. The mean mitral  valve gradient is 7.0 mmHg.   Tricuspid Valve: The tricuspid valve is has been repaired/replaced.  Tricuspid valve regurgitation is moderate . No evidence of tricuspid  stenosis.   Aortic Valve: The aortic valve is tricuspid. Aortic valve regurgitation is  not visualized. No aortic stenosis is present. Aortic valve mean gradient  measures 13.0 mmHg. Aortic valve peak gradient measures 25.2 mmHg. Aortic  valve area, by VTI measures  1.25 cm.   Pulmonic Valve: The pulmonic valve was normal in structure. Pulmonic valve  regurgitation is not visualized. No evidence of pulmonic stenosis.   Aorta: The aortic root is normal in size and structure.   Venous: The inferior vena cava was not well visualized.   IAS/Shunts: The interatrial septum is aneurysmal. No atrial level shunt  detected by color flow Doppler.   Laboratory Data:  High Sensitivity  Troponin:   Recent Labs  Lab 01/29/21 1104  TROPONINIHS 20*     Chemistry Recent Labs  Lab 01/25/21 1116 01/29/21 1104  NA 134* 136  K 2.8* 2.5*  CL 88* 89*  CO2 32 34*  GLUCOSE 234* 207*  BUN 63* 57*  CREATININE 3.53* 3.58*  CALCIUM 10.2 9.7  GFRNONAA 14* 14*  ANIONGAP 14 13    Recent Labs  Lab 01/25/21 1116 01/29/21 1104  PROT 9.8* 8.6*  ALBUMIN 3.8 3.5  AST 35 39  ALT 29 33  ALKPHOS 121 94  BILITOT 1.0 0.8   Hematology Recent Labs  Lab 01/25/21 1116 01/29/21 1104  WBC 5.6 6.6  RBC 4.53 4.25  HGB 12.2 11.5*  HCT 39.0 36.5  MCV 86.1 85.9  MCH 26.9 27.1  MCHC 31.3 31.5  RDW  13.5 13.6  PLT 173 239   BNP Recent Labs  Lab 01/29/21 1104  BNP 33.6    DDimer No results for input(s): DDIMER in the last 168 hours.   Radiology/Studies:  DG Chest Portable 1 View  Result Date: 01/29/2021 CLINICAL DATA:  Chest pain.  Recent open heart surgery EXAM: PORTABLE CHEST 1 VIEW COMPARISON:  01/09/2021 FINDINGS: Cardiomegaly. Prior mitral and tricuspid valve repair. Left atrial clipping. There is no edema, consolidation, effusion, or pneumothorax. IMPRESSION: Stable postoperative chest.  No evidence of active disease. Electronically Signed   By: Monte Fantasia M.D.   On: 01/29/2021 11:32     Assessment and Plan:   Loida Calamia is a 65 y.o. 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 Jeanell Sparrow.  Acute on Chronic combined CHF with dilated RV: Most recent echo showed EF of 60 to 65% with mildly enlarged RV.  Was noted to be volume overloaded at recent office visit on 6/7.  She has failed attempts to diurese as an outpatient.  Notably volume overloaded on exam.  She is currently warm and dry, but complaining of nausea, chest pain/pressure and abdominal discomfort. --We will attempt to diurese with IV Lasix and follow response.  If does not adequately diurese, suspect she may need inotropic support as she has in the past. --Check lactic acid --IV Lasix 120 mg x 1 now --No room for ACE/ARB/spiro with CKD  Valvular heart disease status post MVR/TVR: On 5/12 Dr. Roxan Hockey.  Sternotomy site well-healed.  Does continue to complain of incisional chest discomfort.  Persistent atrial fibrillation status post maze: In atrial fibrillation with rates 90 to 100 bpm on admission.  Was scheduled for outpatient DCCV on 6/18, may need to consider  sooner question whether this is playing a role in her symptoms? --Reports compliance with Eliquis, will continue for now, though will need to keep close eye on Cr --Amiodarone was recently reduced to 200 mg daily --Has been off digoxin with AKI  CKD stage III with AKI: Baseline creatinine 1.6-1.8 in the past.  Noted at 2.8 on recent discharge. --Creatinine 3.58 on admission, suspect related to volume overload.  As above holding any renal insulting agents.  --Daily BMET with need for diuresis  Hypokalemia: Potassium 2.5, given IV supplementation in the ED -- will give additional 3mq x1 now --Per primary  Hypertension: BP stable, somewhat soft while in the ED.  Would hold beta-blocker with concern for possible  low output.  Risk Assessment/Risk Scores:   HEAR Score (for undifferentiated chest pain):  HEAR Score: 2{   New York Heart Association (NYHA) Functional Class NYHA Class III  CHA2DS2-VASc Score = 5  This indicates a 7.2% annual risk of stroke. The patient's score is based upon: CHF History: Yes HTN History: Yes Diabetes History: Yes Stroke History: No Vascular Disease History: Yes Age Score: 0 Gender Score: 1    For questions or updates, please contact [place] Please consult [email or web address] for contact info under    Signed, Reino Bellis, NP  01/29/2021 3:20 PM  Patient seen and examined and agree with Reino Bellis, NP as detailed above.   In brief, the patient is a 65 y.o. female with a hx of morbid obesity, chronic combined systolic/diastolic heart failure with EF of 45 to 50%, severe MR/TR status post recent repair, atrial fibrillation on Eliquis, hypertension and diabetes who presented to the ER with worsening shortness of breath and chest heaviness found to be mildly overloaded with AKI on CKD with Cr 3.5 for which Cardiology has been consulted.   Patient has complex history as detailed in HPI with recent MV/TV repair with MAZE procedure on 12/30/20.  Post-op TEE with LVEF 55% with moderately enlarged RV. She has remained in Afib with plans for possible DCCV in the future. Notably was seen HF clinic recently where ReDs vest clip elevated at 42%. She was instructed to increase her torsemide at that time but UOP did not improve much. She then presented to the ER today with worsening fatigue and orthopnea with Cr 3.5 (from 2.8 from recent discharge) with concern for acute on chronic diastolic HF. Lactate normal and warm on exam so fortunately does not appear to be in low output state.  Exam: GEN: Tired appearing but comfortable Neck: JVD to the angle of the mandible Cardiac: Irregular, no murmur Respiratory: Diminished but clear GI: Soft, nontender, non-distended  MS: No edema; No deformity. Neuro:  Nonfocal  Psych: Normal affect    Plan: -Start diuresis with lasix 160m IV BID and monitor response -Continue metolazone 529mdaily -Unable to tolerate spiro/SGLT2i given CKD and GFR -Continue apixaban 39m38mID; will need to watch with renal function -Continue amiodarone 200m68mily; plan for DCCV later this month -Hold dig with AKI -Monitor I/Os and daily weights  HeatGwyndolyn Kaufman

## 2021-01-29 NOTE — ED Provider Notes (Signed)
[facility] EMERGENCY DEPARTMENT Provider Note   CSN: [id number] Arrival date & time: 01/29/21  1043     History Chief Complaint  Patient presents with   Shortness of Breath    Arma Maynard-Stahle is a 64 y.o. female.  HPI 64-year-old female with a extensive medical history including CHF with an EF of 60 to 65% as of May 2022, COPD, DM type II, DVT T in pregnancy, hypertension, recent open heart surgery with mitral valve repair on 12/29/2020 with Dr. Hendrickson presents to the ER with complaints of chest pain and shortness of breath, ongoing over the last 3 days worsening today.  Reports that she feels like she is "drowning", with significant shortness of breath.  She does have home O2 which she uses "when I needed".  Normally uses 2 L.  She also reports central chest pain, worse with inspiration, which was unrelieved with nitro with EMS but had reported that her breathing got better with it. She denies any cough, fevers or chills.  Patient does have a history of A. fib, was found to be in A. fib with a rate between 80 and 120.   Per chart review, seen by Dr. Hendrickson's PA Simmons on 6/7.  Appears to have had similar complaints then.  She had her torsemide increased to 80 mg twice daily.  Has pending follow-up with Dr. Hendrickson on 6/14.   HPI: A 64-year-old patient with a history of treated diabetes and hypertension presents for evaluation of chest pain. Initial onset of pain was less than one hour ago. The patient's chest pain is not worse with exertion. The patient complains of nausea. The patient's chest pain is not middle- or left-sided, is not well-localized, is not described as heaviness/pressure/tightness, is not sharp and does not radiate to the arms/jaw/neck. The patient denies diaphoresis. The patient has no history of stroke, has no history of peripheral artery disease, has not smoked in the past 90 days, has no relevant family history of coronary artery  disease (first degree relative at less than age 55), has no history of hypercholesterolemia and does not have an elevated BMI (>=30).   Past Medical History:  Diagnosis Date   Allergic rhinitis    Arthritis    Asthma    Chest pain 12/27/2017   Chronic diastolic CHF (congestive heart failure) (HCC)    COPD (chronic obstructive pulmonary disease) (HCC)    Depression    DM (diabetes mellitus) (HCC)    DVT (deep vein thrombosis) in pregnancy    Dysrhythmia    atrial fibrilation   GERD (gastroesophageal reflux disease)    Headache(784.0)    HTN (hypertension)    Hyperlipidemia    Obesity    Pneumonia 04/2018   RIGHT LOBE   Shortness of breath    Sleep apnea    compliant with CPAP    Patient Active Problem List   Diagnosis Date Noted   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   Encounter for preoperative dental examination    Teeth missing    Gingivitis    Accretions on teeth    CHF exacerbation (HCC) 12/21/2020   Acute on chronic heart failure (HCC) 12/19/2020   Atrial fibrillation, permanent (HCC) 11/02/2020   Chronic combined systolic and diastolic heart failure (HCC) 05/06/2020   NICM (nonischemic cardiomyopathy) (HCC) 04/01/2020   Family history of heart disease 04/01/2020   Mitral regurgitation 04/01/2020   Severe tricuspid regurgitation 04/01/2020   Closed right ankle fracture 09/24/2019   DKA, type   2 (HCC) 09/24/2019   HHNC (hyperglycemic hyperosmolar nonketotic coma) (HCC) 02/21/2019   Hypokalemia 02/21/2019   Acute kidney injury superimposed on CKD (HCC) 02/21/2019   Acute on chronic left systolic heart failure (HCC) 07/01/2018   Congestive heart failure with left ventricular diastolic dysfunction, acute (HCC) 07/01/2018   Right upper lobe pneumonia 05/14/2018   Atrial fibrillation with RVR (HCC) 05/14/2018   Chronic atrial fibrillation (HCC) 12/26/2017   Diastolic dysfunction 12/26/2017   Diabetes mellitus type 2 in obese (HCC) 12/26/2017   Nausea vomiting and  diarrhea    Chest pain 02/26/2012   OSA (obstructive sleep apnea) 09/21/2011   Hypoxia 09/21/2011   Diabetes mellitus (HCC) 02/04/2010   Morbid obesity (HCC) 02/04/2010   Atrial fibrillation (HCC) 02/04/2010   Gastroparesis 02/04/2010   Acute gastroenteritis 02/04/2010    Past Surgical History:  Procedure Laterality Date   ABDOMINAL HYSTERECTOMY     BUBBLE STUDY  11/26/2020   Procedure: BUBBLE STUDY;  Surgeon: Christopher, Bridgette, MD;  Location: [facility];  Service: Cardiovascular;;   CARDIOVERSION N/A 05/06/2020   Procedure: CARDIOVERSION;  Surgeon: Christopher, Bridgette, MD;  Location: [facility];  Service: Cardiovascular;  Laterality: N/A;   CARDIOVERSION N/A 11/04/2020   Procedure: CARDIOVERSION;  Surgeon: Crenshaw, Brian S, MD;  Location: [facility];  Service: Cardiovascular;  Laterality: N/A;   CATARACT EXTRACTION, BILATERAL     CHOLECYSTECTOMY     COLONOSCOPY WITH PROPOFOL N/A 03/05/2015   Procedure: COLONOSCOPY WITH PROPOFOL;  Surgeon: Patrick Hung, MD;  Location: [facility];  Service: Endoscopy;  Laterality: N/A;   DIAGNOSTIC LAPAROSCOPY     ingrown hallux Left    KNEE SURGERY     LEFT HEART CATH AND CORONARY ANGIOGRAPHY N/A 12/31/2017   Procedure: LEFT HEART CATH AND CORONARY ANGIOGRAPHY;  Surgeon: Harwani, Mohan, MD;  Location: [facility];  Service: Cardiovascular;  Laterality: N/A;   MAZE N/A 12/29/2020   Procedure: MAZE;  Surgeon: Hendrickson, Steven C, MD;  Location: [facility] OR;  Service: Open Heart Surgery;  Laterality: N/A;   MITRAL VALVE REPAIR N/A 12/29/2020   Procedure: MITRAL VALVE REPAIR USING CARBOMEDICS ANNULOFLEX RING SIZE 30;  Surgeon: Hendrickson, Steven C, MD;  Location: [facility] OR;  Service: Open Heart Surgery;  Laterality: N/A;   RIGHT HEART CATH N/A 12/22/2020   Procedure: RIGHT HEART CATH;  Surgeon: McLean, Dalton S, MD;  Location: [facility];  Service: Cardiovascular;  Laterality: N/A;   TEE WITHOUT CARDIOVERSION N/A 11/26/2020   Procedure:  TRANSESOPHAGEAL ECHOCARDIOGRAM (TEE);  Surgeon: Christopher, Bridgette, MD;  Location: [facility];  Service: Cardiovascular;  Laterality: N/A;   TEE WITHOUT CARDIOVERSION N/A 12/29/2020   Procedure: TRANSESOPHAGEAL ECHOCARDIOGRAM (TEE);  Surgeon: Hendrickson, Steven C, MD;  Location: [facility] OR;  Service: Open Heart Surgery;  Laterality: N/A;   TRICUSPID VALVE REPLACEMENT N/A 12/29/2020   Procedure: 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 RING SIZE 34;  Surgeon: Hendrickson, Steven C, MD;  Location: [facility] OR;  Service: Open Heart Surgery;  Laterality: N/A;     OB History   No obstetric history on file.     Family History  Problem Relation Age of Onset   Cancer Mother    Hypertension Mother    Cancer Father    CAD Other    Hypertension Sister    Hypertension Brother     Social History   Tobacco Use   Smoking status: Never   Smokeless tobacco: Never  Vaping Use   Vaping Use: Never used  Substance Use Topics     Alcohol use: No   Drug use: No    Home Medications Prior to Admission medications   Medication Sig Start Date End Date Taking? Authorizing Provider  Accu-Chek Softclix Lancets lancets Use as instructed to check blood sugar three times daily. E11.69 Patient taking differently: 1 each by Other route See admin instructions. Use as instructed to check blood sugar three times daily. E11.69 04/21/20  Yes Newlin, Enobong, MD  albuterol (VENTOLIN HFA) 108 (90 Base) MCG/ACT inhaler Inhale 2 puffs into the lungs every 4 (four) hours as needed for wheezing or shortness of breath.  08/15/19  Yes [provider]  Alcohol Swabs (B-D SINGLE USE SWABS REGULAR) PADS 1 each by Other route daily. 02/19/19  Yes [provider]  amiodarone (PACERONE) 200 MG tablet Take 1 tablet (200 mg total) by mouth daily. 01/25/21  Yes Simmons, Brittainy M, PA-C  apixaban (ELIQUIS) 5 MG TABS tablet Take 1 tablet (5 mg total) by mouth 2 (two) times daily. 11/09/20  Yes Christopher, Bridgette,  MD  B-D ULTRAFINE III SHORT PEN 31G X 8 MM MISC 1 each by Other route in the morning, at noon, in the evening, and at bedtime. 11/18/20  Yes [provider]  Blood Glucose Monitoring Suppl (ACCU-CHEK GUIDE ME) w/Device KIT Use as instructed to check blood sugar three times daily. E11.69 04/21/20  Yes Newlin, Enobong, MD  Blood Glucose Monitoring Suppl (ACCU-CHEK GUIDE) w/Device KIT 1 each by Other route in the morning, at noon, and at bedtime. 04/21/20  Yes [provider]  DULoxetine (CYMBALTA) 60 MG capsule Take 1 capsule (60 mg total) by mouth daily. 09/24/20  Yes Edwards, Michelle P, NP  EPINEPHrine 0.3 mg/0.3 mL IJ SOAJ injection Inject 0.3 mg into the muscle as needed for anaphylaxis. 06/25/20  Yes Edwards, Michelle P, NP  furosemide (LASIX) 40 MG tablet Take 40 mg by mouth 2 (two) times daily. 01/01/21  Yes [provider]  insulin aspart (NOVOLOG) 100 UNIT/ML injection Inject 12 Units into the skin 3 (three) times daily before meals. For blood sugars 0-199 give 0 units of insulin, 201-250 give 4 units, 251-300 give 6 units, 301-350 give 8 units, 351-400 give 10 units,> 400 give 12 units and call M.D. Patient taking differently: Inject 5 Units into the skin 3 (three) times daily before meals. For blood sugars 0-199 give 0 units of insulin, 201-250 give 4 units, 251-300 give 6 units, 301-350 give 8 units, 351-400 give 7. 06/24/20  Yes Edwards, Michelle P, NP  LEVEMIR 100 UNIT/ML injection INJECT 0.4 MLS (40 UNITS TOTAL) INTO THE SKIN 2 (TWO) TIMES DAILY. Patient taking differently: Inject 50 Units into the skin 2 (two) times daily. 12/01/20  Yes Edwards, Michelle P, NP  metolazone (ZAROXOLYN) 5 MG tablet Take 5 mg by mouth daily. 01/05/21  Yes [provider]  metoprolol succinate (TOPROL-XL) 100 MG 24 hr tablet Take 100 mg by mouth daily. 01/18/21  Yes [provider]  Multiple Vitamins-Minerals (MULTIVITAMIN WOMEN PO) Take 1 tablet by mouth daily.    Yes [provider]  nitroGLYCERIN (NITROSTAT) 0.4 MG SL tablet Place 1 tablet (0.4 mg total) under the tongue every 5 (five) minutes x 3 doses as needed for chest pain. 01/08/14  Yes Harwani, Mohan, MD  oxyCODONE (OXY IR/ROXICODONE) 5 MG immediate release tablet Take 1 tablet (5 mg total) by mouth every 4 (four) hours as needed for severe pain. 01/12/21  Yes Barrett, Erin R, PA-C  pantoprazole (PROTONIX) 40 MG tablet Take 40   mg by mouth 2 (two) times daily. 12/27/20  Yes [provider]  Potassium Chloride ER 20 MEQ TBCR Take 20 mEq by mouth daily. 11/05/20  Yes Ursuy, Renee Lynn, PA-C  RESTASIS 0.05 % ophthalmic emulsion Place 1 drop into both eyes 2 (two) times daily. 11/19/17  Yes [provider]  rosuvastatin (CRESTOR) 10 MG tablet Take 1 tablet (10 mg total) by mouth daily. 09/24/20  Yes Edwards, Michelle P, NP  torsemide (DEMADEX) 20 MG tablet Take 4 tablets (80 mg total) by mouth daily. 01/25/21  Yes Simmons, Brittainy M, PA-C  TRUE METRIX BLOOD GLUCOSE TEST test strip USE AS INSTRUCTED TO CHECK BLOOD SUGAR DAILY. Patient taking differently: 1 each by Other route daily. 07/21/20  Yes Newlin, Enobong, MD  TRULICITY 1.5 MG/0.5ML SOPN INJECT 1.5 MG INTO THE SKIN ONCE A WEEK. 12/05/20  Yes Edwards, Michelle P, NP    Allergies    Sulfa antibiotics  Review of Systems   Review of Systems  Constitutional:  Positive for fatigue. Negative for chills and fever.  HENT:  Negative for ear pain and sore throat.   Eyes:  Negative for pain and visual disturbance.  Respiratory:  Positive for chest tightness and shortness of breath. Negative for cough.   Cardiovascular:  Positive for chest pain. Negative for palpitations.  Gastrointestinal:  Positive for abdominal pain. Negative for vomiting.  Genitourinary:  Negative for dysuria and hematuria.  Musculoskeletal:  Negative for arthralgias and back pain.  Skin:  Negative for color change and rash.  Neurological:  Negative for seizures and syncope.   All other systems reviewed and are negative.  Physical Exam Updated Vital Signs BP 126/77   Pulse 97   Temp 97.8 F (36.6 C) (Oral)   Resp 20   SpO2 100%   Physical Exam Vitals reviewed.  Constitutional:      Appearance: Normal appearance. She is ill-appearing (Chronically ill-appearing). She is not toxic-appearing.     Comments: Uncomfortable appearing, tachypneic  HENT:     Head: Normocephalic and atraumatic.  Eyes:     General:        Right eye: No discharge.        Left eye: No discharge.     Extraocular Movements: Extraocular movements intact.     Conjunctiva/sclera: Conjunctivae normal.  Pulmonary:     Breath sounds: Decreased breath sounds present.     Comments: Lung sounds diminished throughout, with scattered crackles Chest:     Chest wall: Tenderness present.     Comments: Sternotomy wound well-healed over, with tenderness to palpation to the anterior chest wall Abdominal:     Palpations: Abdomen is soft.     Tenderness: There is no abdominal tenderness.  Musculoskeletal:        General: No swelling. Normal range of motion.     Right lower leg: Edema present.     Left lower leg: Edema present.     Comments: Trace edema to bilateral lower extremities  Neurological:     General: No focal deficit present.     Mental Status: She is alert and oriented to person, place, and time.  Psychiatric:        Mood and Affect: Mood normal.        Behavior: Behavior normal.    ED Results / Procedures / Treatments   Labs (all labs ordered are listed, but only abnormal results are displayed) Labs Reviewed  CBC WITH DIFFERENTIAL/PLATELET - Abnormal; Notable for the following components:        Result Value   Hemoglobin 11.5 (*)    Monocytes Absolute 1.1 (*)    All other components within normal limits  COMPREHENSIVE METABOLIC PANEL - Abnormal; Notable for the following components:   Potassium 2.5 (*)    Chloride 89 (*)    CO2 34 (*)    Glucose, Bld 207 (*)    BUN 57  (*)    Creatinine, Ser 3.58 (*)    Total Protein 8.6 (*)    GFR, Estimated 14 (*)    All other components within normal limits  TROPONIN I (HIGH SENSITIVITY) - Abnormal; Notable for the following components:   Troponin I (High Sensitivity) 20 (*)    All other components within normal limits  TROPONIN I (HIGH SENSITIVITY) - Abnormal; Notable for the following components:   Troponin I (High Sensitivity) 18 (*)    All other components within normal limits  RESP PANEL BY RT-PCR (FLU A&B, COVID) ARPGX2  BRAIN NATRIURETIC PEPTIDE  LACTIC ACID, PLASMA  LACTIC ACID, PLASMA    EKG None  Radiology DG Chest Portable 1 View  Result Date: 01/29/2021 CLINICAL DATA:  Chest pain.  Recent open heart surgery EXAM: PORTABLE CHEST 1 VIEW COMPARISON:  01/09/2021 FINDINGS: Cardiomegaly. Prior mitral and tricuspid valve repair. Left atrial clipping. There is no edema, consolidation, effusion, or pneumothorax. IMPRESSION: Stable postoperative chest.  No evidence of active disease. Electronically Signed   By: Jonathon  Watts M.D.   On: 01/29/2021 11:32    Procedures Procedures   Medications Ordered in ED Medications  furosemide (LASIX) 120 mg in dextrose 5 % 50 mL IVPB (has no administration in time range)  potassium chloride SA (KLOR-CON) CR tablet 60 mEq (has no administration in time range)  morphine 2 MG/ML injection 2 mg (2 mg Intravenous Given 01/29/21 1120)  potassium chloride 10 mEq in 100 mL IVPB (0 mEq Intravenous Stopped 01/29/21 1453)  potassium chloride SA (KLOR-CON) CR tablet 40 mEq (40 mEq Oral Given 01/29/21 1320)    ED Course  I have reviewed the triage vital signs and the nursing notes.  Pertinent labs & imaging results that were available during my care of the patient were reviewed by me and considered in my medical decision making (see chart for details).  Clinical Course as of 01/29/21 1530  Sat Jan 29, 2021  1126 64-year-old female with a complicated medical history presents to  the ER with complaints of chest pain and shortness of breath.  On arrival, she is uncomfortable appearing, tachypneic, but no evidence of hypoxia on her normal 2 L [place].  She does have some tenderness to palpation over the central chest wall where her sternotomy was done, however wound is well-healed over, no overlying erythema or redness or warmth.  She does have trace edema to her lower extremities.  Abdomen is nontender.  Plan for troponins, basic labs, BNP, chest x-ray, EKG.  Will likely touch base with cardiology after her results.   [MB]  1354 CBC unremarkable, CMP with a critical potassium of 2.5, renal function appears at baseline.  BNP is normal.  Initial troponin of 20, COVID is negative.  Chest x-ray largely unchanged with no evidence of pneumonia, abscesses.  Lower suspicion for PE at this time. [MB]  1400 Troponin I (High Sensitivity)(!): 20 [MB]  1400 Potassium(!!): 2.5 [MB]  1406 Consulted Dr. Pemberton with cardiology, she will see the patient decide if she needs admission.  Second troponin pending.    [MB]  1450 Dr. Pemberton requests that the   patient be admitted to medicine with cardiology following. Consulted hospitalist team for admission. IV diuresis initiated by cardiology  [MB]  1529 Spoke with hospitalist team  who will admit the patient for further evaluation and treatment. [MB]    Clinical Course User Index [MB] ,  A, PA-C   MDM Rules/Calculators/A&P HEAR Score: 2                         Final Clinical Impression(s) / ED Diagnoses Final diagnoses:  None    Rx / DC Orders ED Discharge Orders     None        ,  A, PA-C 01/29/21 1531    Ray, Danielle, MD 02/08/21 1448  

## 2021-01-29 NOTE — ED Triage Notes (Signed)
Pt comes from home via EMS. Pt had open heart surgery 2 weeks ago. CP and sob onset yesterday. Hx of CHF, afib. Diminished lung sounds and rales in lower lobes per EMS. A-fib rate 80-120. Central CP radiating to right chest, worsens with inspiration. Denies n/v. 99% on RA but RR 40. Pt placed on 4L , respiration rate decreased slightly. Pt given sublingual nitro x1, no change in pain but reports "breathing got better". BP dropped then recovered after 175 NS infused. 20g in [place]. 324 aspirin given. Pt a/o but lethargic which family reports is baseline since discharge after surgery. BP 108/80 HR 80-120 RR 32 O2 99% 4L 

## 2021-01-30 DIAGNOSIS — N179 Acute kidney failure, unspecified: Secondary | ICD-10-CM

## 2021-01-30 DIAGNOSIS — I5023 Acute on chronic systolic (congestive) heart failure: Secondary | ICD-10-CM

## 2021-01-30 DIAGNOSIS — I5042 Chronic combined systolic (congestive) and diastolic (congestive) heart failure: Secondary | ICD-10-CM

## 2021-01-30 DIAGNOSIS — N189 Chronic kidney disease, unspecified: Secondary | ICD-10-CM

## 2021-01-30 LAB — BASIC METABOLIC PANEL
Anion gap: 9 (ref 5–15)
BUN: 56 mg/dL — ABNORMAL HIGH (ref 8–23)
CO2: 36 mmol/L — ABNORMAL HIGH (ref 22–32)
Calcium: 9.9 mg/dL (ref 8.9–10.3)
Chloride: 89 mmol/L — ABNORMAL LOW (ref 98–111)
Creatinine, Ser: 3.23 mg/dL — ABNORMAL HIGH (ref 0.44–1.00)
GFR, Estimated: 15 mL/min — ABNORMAL LOW (ref >60)
Glucose, Bld: 171 mg/dL — ABNORMAL HIGH (ref 70–99)
Potassium: 3.1 mmol/L — ABNORMAL LOW (ref 3.5–5.1)
Sodium: 134 mmol/L — ABNORMAL LOW (ref 135–145)

## 2021-01-30 LAB — GLUCOSE, CAPILLARY
Glucose-Capillary: 155 mg/dL — ABNORMAL HIGH (ref 70–99)
Glucose-Capillary: 191 mg/dL — ABNORMAL HIGH (ref 70–99)
Glucose-Capillary: 175 mg/dL — ABNORMAL HIGH (ref 70–99)
Glucose-Capillary: 168 mg/dL — ABNORMAL HIGH (ref 70–99)

## 2021-01-30 LAB — POTASSIUM: Potassium: 3.5 mmol/L (ref 3.5–5.1)

## 2021-01-30 LAB — MAGNESIUM: Magnesium: 2 mg/dL (ref 1.7–2.4)

## 2021-01-30 MED ORDER — POTASSIUM CHLORIDE CRYS ER 20 MEQ PO TBCR
40.0000 meq | EXTENDED_RELEASE_TABLET | Freq: Every day | ORAL | Status: DC
  Administered 2021-01-30 – 2021-01-31 (×2): 40 meq via ORAL
  Filled 2021-01-30 (×2): qty 2

## 2021-01-30 MED ORDER — POTASSIUM CHLORIDE CRYS ER 20 MEQ PO TBCR
20.0000 meq | EXTENDED_RELEASE_TABLET | Freq: Once | ORAL | Status: AC
  Administered 2021-01-30: 20 meq via ORAL
  Filled 2021-01-30: qty 1

## 2021-01-30 NOTE — Progress Notes (Signed)
PROGRESS NOTE    [name]  TMH:[id number] DOB: April 08, 1956 DOA: 01/29/2021 PCP: [name], NP   Chief Complain: Chest pain/shortness of breath  Brief Narrative: Patient is a 65 year old female with history of type 2 diabetes, chronic A. fib, chronic kidney disease,, systolic/diastolic positive heart failure, OSA, recent mitral valve surgery on May 11 by Dr. Roxan Hockey secondary to severe mitral regurgitation who presented with 2-day history of chest pain, shortness of breath.  Chest pain was on the right anterior chest wall adjacent to the incision site.  Pain was rated as 8/10.  Also reported orthopnea.  She is on oxygen at 2 L/min via nasal cannula at home as needed.  On presentation she was hemodynamically stable.  Lab work showed severe hypokalemia, mild elevated troponin.  Lab work showed AKI with creatinine of 3.58.  Patient was admitted for the management of acute exacerbation of congestive heart failure.  Cardiology following.  Assessment & Plan:   Principal Problem:   Acute exacerbation of CHF (congestive heart failure) (HCC) Active Problems:   Atrial fibrillation (HCC)   OSA (obstructive sleep apnea)   Chest pain   Diabetes mellitus type 2 in obese (HCC)   Hypokalemia   Acute kidney injury superimposed on CKD (HCC)   Chronic combined systolic and diastolic heart failure (HCC)   S/P mitral valve repair   Acute on chronic systolic/diastolic congestive heart failure: Recently EF of 4 60 to 65%, MR/TR status post recent repair.  Noted to be volume overloaded on presentation.  Presented with chest pain, shortness of breath, orthopnea.  Started on IV Lasix.  Also on metolazone.  Continue monitoring versus output not elevated.  Cardiology following.  AKI on CKD stage IIIb: Baseline creatinine ranges from 1.6-1.8.  Creatinine in the range of 3 on presentation.  Most likely secondary to cardiorenal syndrome.  Continue diuresis  Hypokalemia: Being supplemented and  monitored.  History of MVR/TVR: Underwent repair with maze procedure on 5/22 by Dr. Roxan Hockey.  Permanent A. Fib: remains in persistent A. fib.  On amiodarone.  On Eliquis for anticoagulation.  Plan for DCCV later this month.  Digoxin on hold due to AKI.  Hypertension: Currently blood stable.  Continue to monitor.  OSA: On CPAP.  Diabetes type 2: Continue current insulin regimen.  Continue home regimen on discharge.  Chest pain: Atypical chest pain, mainly on the incision site.  Continue as needed meds.  No evidence of ischemia.  Generalized weakness: PT/OT consulted  Morbid obesity: BMI 40.            DVT prophylaxis:Eliquis Code Status: Full Family Communication: None at bedside Status is: Inpatient  Remains inpatient appropriate because:IV treatments appropriate due to intensity of illness or inability to take PO  Dispo: The patient is from: Home              Anticipated d/c is to: Home              Patient currently is not medically stable to d/c.   Difficult to place patient No      Consultants: Cardiology  Procedures:None  Antimicrobials:  Anti-infectives (From admission, onward)    None       Subjective:  Patient seen and examined at the bedside this morning.  Hemodynamically stable.  She was sitting on the chair.  Complains of severe generalized weakness.  Does not look dyspneic but complains of shortness of breath.  Still has significant bilateral edema.  Complains of pain on the incision site  of the chest   Objective: Vitals:   01/29/21 2008 01/30/21 0052 01/30/21 0517 01/30/21 0715  BP: 109/68 134/86 114/63 112/64  Pulse: 84 88 77 76  Resp: 18 20 18 20   Temp: 98.2 F (36.8 C) (!) 97.4 F (36.3 C) 97.9 F (36.6 C) (!) 97.3 F (36.3 C)  TempSrc: Oral Oral Oral Oral  SpO2: 99% 100% 100% 100%  Weight:   112.9 kg   Height:        Intake/Output Summary (Last 24 hours) at 01/30/2021 0957 Last data filed at 01/30/2021 0801 Gross per 24  hour  Intake 740 ml  Output 1250 ml  Net -510 ml   Filed Weights   01/29/21 1700 01/30/21 0517  Weight: (P) 113.4 kg 112.9 kg    Examination:  General exam: Chronically looking, awake, obese HEENT:PERRL,Oral mucosa moist, Ear/Nose normal on gross exam Respiratory system: Bilateral diminished air sounds on the bases, no wheezes or crackles  cardiovascular system: Irregularly irregular rhythm , surgical scar on the chest gastrointestinal system: Abdomen is nondistended, soft and nontender. No organomegaly or masses felt. Normal bowel sounds heard. Central nervous system: Alert and oriented. No focal neurological deficits. Extremities: 2+ bilateral lower extremity edema, no clubbing ,no cyanosis Skin: No rashes, lesions or ulcers,no icterus ,no pallor    Data Reviewed: I have personally reviewed following labs and imaging studies  CBC: Recent Labs  Lab 01/25/21 1116 01/29/21 1104  WBC 5.6 6.6  NEUTROABS  --  3.6  HGB 12.2 11.5*  HCT 39.0 36.5  MCV 86.1 85.9  PLT 173 283   Basic Metabolic Panel: Recent Labs  Lab 01/25/21 1116 01/29/21 1104 01/29/21 2146 01/30/21 0211  NA 134* 136  --  134*  K 2.8* 2.5*  --  3.1*  CL 88* 89*  --  89*  CO2 32 34*  --  36*  GLUCOSE 234* 207*  --  171*  BUN 63* 57*  --  56*  CREATININE 3.53* 3.58*  --  3.23*  CALCIUM 10.2 9.7  --  9.9  MG  --   --  1.9  --    GFR: Estimated Creatinine Clearance: 22.8 mL/min (A) (by C-G formula based on SCr of 3.23 mg/dL (H)). Liver Function Tests: Recent Labs  Lab 01/25/21 1116 01/29/21 1104  AST 35 39  ALT 29 33  ALKPHOS 121 94  BILITOT 1.0 0.8  PROT 9.8* 8.6*  ALBUMIN 3.8 3.5   No results for input(s): LIPASE, AMYLASE in the last 168 hours. No results for input(s): AMMONIA in the last 168 hours. Coagulation Profile: No results for input(s): INR, PROTIME in the last 168 hours. Cardiac Enzymes: No results for input(s): CKTOTAL, CKMB, CKMBINDEX, TROPONINI in the last 168 hours. BNP  (last 3 results) No results for input(s): PROBNP in the last 8760 hours. HbA1C: No results for input(s): HGBA1C in the last 72 hours. CBG: Recent Labs  Lab 01/29/21 2007 01/30/21 0602  GLUCAP 274* 155*   Lipid Profile: No results for input(s): CHOL, HDL, LDLCALC, TRIG, CHOLHDL, LDLDIRECT in the last 72 hours. Thyroid Function Tests: No results for input(s): TSH, T4TOTAL, FREET4, T3FREE, THYROIDAB in the last 72 hours. Anemia Panel: No results for input(s): VITAMINB12, FOLATE, FERRITIN, TIBC, IRON, RETICCTPCT in the last 72 hours. Sepsis Labs: Recent Labs  Lab 01/29/21 1504 01/29/21 1758  LATICACIDVEN 1.3 1.4    Recent Results (from the past 240 hour(s))  Resp Panel by RT-PCR (Flu A&B, Covid) Nasopharyngeal Swab     Status:  None   Collection Time: 01/29/21 11:12 AM   Specimen: Nasopharyngeal Swab; Nasopharyngeal(NP) swabs in vial transport medium  Result Value Ref Range Status   SARS Coronavirus 2 by RT PCR NEGATIVE NEGATIVE Final    Comment: (NOTE) SARS-CoV-2 target nucleic acids are NOT DETECTED.  The SARS-CoV-2 RNA is generally detectable in upper respiratory specimens during the acute phase of infection. The lowest concentration of SARS-CoV-2 viral copies this assay can detect is 138 copies/mL. A negative result does not preclude SARS-Cov-2 infection and should not be used as the sole basis for treatment or other patient management decisions. A negative result may occur with  improper specimen collection/handling, submission of specimen other than nasopharyngeal swab, presence of viral mutation(s) within the areas targeted by this assay, and inadequate number of viral copies(<138 copies/mL). A negative result must be combined with clinical observations, patient history, and epidemiological information. The expected result is Negative.  Fact Sheet for Patients:  EntrepreneurPulse.com.au  Fact Sheet for Healthcare Providers:   IncredibleEmployment.be  This test is no t yet approved or cleared by the [place] FDA and  has been authorized for detection and/or diagnosis of SARS-CoV-2 by FDA under an Emergency Use Authorization (EUA). This EUA will remain  in effect (meaning this test can be used) for the duration of the COVID-19 declaration under Section 564(b)(1) of the Act, 21 U.S.C.section 360bbb-3(b)(1), unless the authorization is terminated  or revoked sooner.       Influenza A by PCR NEGATIVE NEGATIVE Final   Influenza B by PCR NEGATIVE NEGATIVE Final    Comment: (NOTE) The Xpert Xpress SARS-CoV-2/FLU/RSV plus assay is intended as an aid in the diagnosis of influenza from Nasopharyngeal swab specimens and should not be used as a sole basis for treatment. Nasal washings and aspirates are unacceptable for Xpert Xpress SARS-CoV-2/FLU/RSV testing.  Fact Sheet for Patients: EntrepreneurPulse.com.au  Fact Sheet for Healthcare Providers: IncredibleEmployment.be  This test is not yet approved or cleared by the [place] FDA and has been authorized for detection and/or diagnosis of SARS-CoV-2 by FDA under an Emergency Use Authorization (EUA). This EUA will remain in effect (meaning this test can be used) for the duration of the COVID-19 declaration under Section 564(b)(1) of the Act, 21 U.S.C. section 360bbb-3(b)(1), unless the authorization is terminated or revoked.  Performed at [facility], [place] [address], [place], [place] [address]          Radiology Studies: DG Chest Portable 1 View  Result Date: 01/29/2021 CLINICAL DATA:  Chest pain.  Recent open heart surgery EXAM: PORTABLE CHEST 1 VIEW COMPARISON:  01/09/2021 FINDINGS: Cardiomegaly. Prior mitral and tricuspid valve repair. Left atrial clipping. There is no edema, consolidation, effusion, or pneumothorax. IMPRESSION: Stable postoperative chest.  No evidence of active  disease. Electronically Signed   By: Monte Fantasia M.D.   On: 01/29/2021 11:32        Scheduled Meds:  amiodarone  200 mg Oral Daily   apixaban  5 mg Oral BID   cycloSPORINE  1 drop Both Eyes BID   DULoxetine  60 mg Oral Daily   insulin aspart  0-15 Units Subcutaneous TID WC   insulin aspart  5 Units Subcutaneous TID AC   insulin detemir  50 Units Subcutaneous BID   metolazone  5 mg Oral Daily   metoprolol succinate  100 mg Oral Daily   multivitamin with minerals  1 tablet Oral Daily   pantoprazole  40 mg Oral BID   potassium chloride  20 mEq Oral Once   potassium chloride  40 mEq Oral Daily   rosuvastatin  10 mg Oral Daily   sodium chloride flush  3 mL Intravenous Q12H   Continuous Infusions:  sodium chloride     furosemide 120 mg (01/30/21 0816)     LOS: 1 day    Time spent: 35 mins.More than 50% of that time was spent in counseling and/or coordination of care.      Shelly Coss, MD [facility] P6/07/2021, 9:57 AM

## 2021-01-30 NOTE — Progress Notes (Signed)
Progress Note  Patient Name: [name] Date of Encounter: 01/30/2021  [facility] Cardiologist: Buford Dresser, MD   Subjective   States she continues to feel fatigued and wiped out. Plans to work with PT this morning. Was dizzy with standing. Orthostatics pending.  Cr improving from 3.588-->3.23 K low at 3.1; written for repletion Lactate normal Net negative 630 to lasix 120mg  IV  Wt 248 this AM (not recorded prior)  Inpatient Medications    Scheduled Meds:  amiodarone  200 mg Oral Daily   apixaban  5 mg Oral BID   cycloSPORINE  1 drop Both Eyes BID   DULoxetine  60 mg Oral Daily   insulin aspart  0-15 Units Subcutaneous TID WC   insulin aspart  5 Units Subcutaneous TID AC   insulin detemir  50 Units Subcutaneous BID   metolazone  5 mg Oral Daily   metoprolol succinate  100 mg Oral Daily   multivitamin with minerals  1 tablet Oral Daily   pantoprazole  40 mg Oral BID   potassium chloride  40 mEq Oral Daily   rosuvastatin  10 mg Oral Daily   sodium chloride flush  3 mL Intravenous Q12H   Continuous Infusions:  sodium chloride     furosemide 120 mg (01/30/21 0816)   PRN Meds: sodium chloride, albuterol, nitroGLYCERIN, oxyCODONE, sodium chloride flush   Vital Signs    Vitals:   01/29/21 2008 01/30/21 0052 01/30/21 0517 01/30/21 0715  BP: 109/68 134/86 114/63 112/64  Pulse: 84 88 77 76  Resp: 18 20 18 20   Temp: 98.2 F (36.8 C) (!) 97.4 F (36.3 C) 97.9 F (36.6 C) (!) 97.3 F (36.3 C)  TempSrc: Oral Oral Oral Oral  SpO2: 99% 100% 100% 100%  Weight:   112.9 kg   Height:        Intake/Output Summary (Last 24 hours) at 01/30/2021 0839 Last data filed at 01/30/2021 0801 Gross per 24 hour  Intake 740 ml  Output 1250 ml  Net -510 ml   Last 3 Weights 01/30/2021 01/29/2021 01/25/2021  Weight (lbs) 248 lb 14.4 oz 250 lb 252 lb 9.6 oz  Weight (kg) 112.9 kg 113.4 kg 114.579 kg  Some encounter information is confidential and restricted. Go to  Review Flowsheets activity to see all data.      Telemetry    Afib with HR 80s- Personally Reviewed  ECG    No new tracing - Personally Reviewed  Physical Exam   GEN: Comfortable, NAD Neck: JVD to angle of the mandible Cardiac: Irregularly irregular, soft systolic murmur, no rubs Respiratory: Diminished but clear GI: Soft, nontender, non-distended  MS: Trace edema, warm Neuro:  Nonfocal  Psych: Normal affect   Labs    High Sensitivity Troponin:   Recent Labs  Lab 01/29/21 1104 01/29/21 1304  TROPONINIHS 20* 18*      Chemistry Recent Labs  Lab 01/25/21 1116 01/29/21 1104 01/30/21 0211  NA 134* 136 134*  K 2.8* 2.5* 3.1*  CL 88* 89* 89*  CO2 32 34* 36*  GLUCOSE 234* 207* 171*  BUN 63* 57* 56*  CREATININE 3.53* 3.58* 3.23*  CALCIUM 10.2 9.7 9.9  PROT 9.8* 8.6*  --   ALBUMIN 3.8 3.5  --   AST 35 39  --   ALT 29 33  --   ALKPHOS 121 94  --   BILITOT 1.0 0.8  --   GFRNONAA 14* 14* 15*  ANIONGAP 14 13 9  Hematology Recent Labs  Lab 01/25/21 1116 01/29/21 1104  WBC 5.6 6.6  RBC 4.53 4.25  HGB 12.2 11.5*  HCT 39.0 36.5  MCV 86.1 85.9  MCH 26.9 27.1  MCHC 31.3 31.5  RDW 13.5 13.6  PLT 173 239    BNP Recent Labs  Lab 01/29/21 1104  BNP 33.6     DDimer No results for input(s): DDIMER in the last 168 hours.   Radiology    DG Chest Portable 1 View  Result Date: 01/29/2021 CLINICAL DATA:  Chest pain.  Recent open heart surgery EXAM: PORTABLE CHEST 1 VIEW COMPARISON:  01/09/2021 FINDINGS: Cardiomegaly. Prior mitral and tricuspid valve repair. Left atrial clipping. There is no edema, consolidation, effusion, or pneumothorax. IMPRESSION: Stable postoperative chest.  No evidence of active disease. Electronically Signed   By: Monte Fantasia M.D.   On: 01/29/2021 11:32    Cardiac Studies   Echo: 01/02/21   IMPRESSIONS     1. No subcostal views; Elevated mean gradient across aortic valve (mean  gradient 13 mmHg) but visually valve opens  well; S/P MV repair with mean  gradient 7 mmHg and trace MR; S/P TV repair with moderate TR.   2. Left ventricular ejection fraction, by estimation, is 60 to 65%. The  left ventricle has normal function. The left ventricle has no regional  wall motion abnormalities. There is moderate left ventricular hypertrophy.  There is the interventricular  septum is flattened in systole and diastole, consistent with right  ventricular pressure and volume overload.   3. Right ventricular systolic function is normal. The right ventricular  size is mildly enlarged.   4. Left atrial size was moderately dilated.   5. Right atrial size was mildly dilated.   6. The mitral valve has been repaired/replaced. Trivial mitral valve  regurgitation. No evidence of mitral stenosis.   7. The tricuspid valve is has been repaired/replaced. Tricuspid valve  regurgitation is moderate.   8. The aortic valve is tricuspid. Aortic valve regurgitation is not  visualized. No aortic stenosis is present.   FINDINGS   Left Ventricle: Left ventricular ejection fraction, by estimation, is 60  to 65%. The left ventricle has normal function. The left ventricle has no  regional wall motion abnormalities. The left ventricular internal cavity  size was normal in size. There is   moderate left ventricular hypertrophy. The interventricular septum is  flattened in systole and diastole, consistent with right ventricular  pressure and volume overload.   Right Ventricle: The right ventricular size is mildly enlarged. Right  ventricular systolic function is normal.   Left Atrium: Left atrial size was moderately dilated.   Right Atrium: Right atrial size was mildly dilated.   Pericardium: There is no evidence of pericardial effusion.   Mitral Valve: The mitral valve has been repaired/replaced. Trivial mitral  valve regurgitation. There is a prosthetic annuloplasty ring present in  the mitral position. No evidence of mitral valve  stenosis. MV peak  gradient, 21.2 mmHg. The mean mitral  valve gradient is 7.0 mmHg.   Tricuspid Valve: The tricuspid valve is has been repaired/replaced.  Tricuspid valve regurgitation is moderate . No evidence of tricuspid  stenosis.   Aortic Valve: The aortic valve is tricuspid. Aortic valve regurgitation is  not visualized. No aortic stenosis is present. Aortic valve mean gradient  measures 13.0 mmHg. Aortic valve peak gradient measures 25.2 mmHg. Aortic  valve area, by VTI measures  1.25 cm.   Pulmonic Valve: The pulmonic valve was normal  in structure. Pulmonic valve  regurgitation is not visualized. No evidence of pulmonic stenosis.   Aorta: The aortic root is normal in size and structure.   Venous: The inferior vena cava was not well visualized.   IAS/Shunts: The interatrial septum is aneurysmal. No atrial level shunt  detected by color flow Doppler.    Patient Profile     65 y.o. female with a history of morbid obesity, chronic combined systolic/diastolic heart failure with EF of 45 to 50%, severe MR/TR status post recent repair, atrial fibrillation on Eliquis, hypertension and diabetes who presented to the ER with worsening shortness of breath and chest heaviness found to be mildly overloaded with AKI on CKD with Cr 3.5 for which Cardiology has been consulted.  Assessment & Plan    #Acute on Chronic Combined Systolic and Diastolic HF: Most recent TTE with improved LVEF 45-50%-->60-65% with mildly enlarged RV. Notably, patient is s/p MV/TV repair with MAZE procedure on 12/30/20. Now presenting with worsening orthopnea, chest heaviness and fatigue found to be volume overloaded on exam with NYHA class III symptoms. Recent ReDs vest clip elevated at 42%. Lactate normal and patient warm on exam with no evidence of low out-put state. -Continue diuresis with lasix 120mg  IV BID and metolazone 5mg  daily for now; may have to adjust pending orthostatics -Continue metop 100mg  XL daily;  can decrease if orthostatics positive -Continue scheduled K with diuresis; repeat lytes this afternoon with goal K>4, Mg>2 -Unable to tolerate spiro/SGLT2i at this time due to CKD and low GFR -Monitor I/Os and daily standing weights -Low Na diet  #Severe MR/TR s/p mitral valve repair and tricuspid valve repair on 12/30/20: Sternotomy site healing. Post-op TTE with LVEF 60-65%, mean MV gradient 55mmHg, trace MR, moderate TR, mild RV enlargement but with preserved systolic function. Sternotomy site healing well. -Follow-up with surgery as scheduled -Management of HF as above  #Longstanding Persistent Afib s/p MAZE on 12/30/20: Remains in Afib. Plan for DCCV on 6/18 once 4 weeks of AC and euvolemic. -Continue amiodarone 200mg  daily (dose decreased due to nausea) -Continue apixaban 5mg  BID -Off dig due to AKI on CKD -Scheduled for OP DCCV on 6/18  #AKI on Stage III CKD: Baseline Cr 1.6-1.8 but was 2.8 on last admission. Now 3.57 in the setting of volume overload as above.  -Diuresis as above -Monitor I/Os and daily weights -Trend BMET with diuresis  #DMII: -Management per primary  #Obesity/deconditioning: -Will need cardiac rehab on discharge  #HLD: -Continue crestor 10mg  daily    For questions or updates, please contact [place] Please consult [email or web address] for contact info under        Signed, Freada Bergeron, MD  01/30/2021, 8:39 AM

## 2021-01-30 NOTE — Evaluation (Signed)
Physical Therapy Evaluation Patient Details Name: [name] MRN: [id number] DOB: 1956-02-10 Today's Date: 01/30/2021   History of Present Illness  [name] is a 65 y.o. female who presented for a 2+ day history of chest pain and shortness of breath that she feels like she is drowning; Pain does radiate down her right arm. Her shortness of breath is also new, worse with walking and laying down; with PMH significant of type 2 diabetes, chronic atrial fibrillation, chronic kidney disease, combined systolic and diastolic heart failure, OSA, recent mitral valve replacement surgery on May 11 by Dr. Roxan Hockey secondary to severe mitral regurg.  Clinical Impression   Pt admitted with above diagnosis. Lives at home with family who are helpful; they work days, so she must be at or close to modified  independent  to dc home; single level home with a few steps to enter; Presents to PT with generalized weakness, fatigue, dizziness with upright standing; Orthostatic on eval (see other note of this date, or below); Unable to stand long enough tfor standing BP; Noteworthy also that even sitting, BP decreased, especially DBP, unitl her feet were elevated in recliner; Consider trying TED hose; Pt currently with functional limitations due to the deficits listed below (see PT Problem List). Pt will benefit from skilled PT to increase their independence and safety with mobility to allow discharge to the venue listed below.       Follow Up Recommendations Home health PT;Other (comment) (I'm hopeful for good progress with activity tolerance and mobility as she improves medically)    Equipment Recommendations  Rolling walker with 5" wheels;3in1 (PT);Other (comment) (Does she have a Catering manager)    Recommendations for [employer] OT consult (ordered per protocol)     Precautions / Restrictions Precautions Precautions: Fall;Other (comment) Precaution Comments: Dizziness with standing       Mobility  Bed Mobility Overal bed mobility: Needs Assistance Bed Mobility: Supine to Sit     Supine to sit: Supervision     General bed mobility comments: Able to smoothly progress self to sitting EOB    Transfers Overall transfer level: Needs assistance Equipment used: None (Occasionally hand on [employer]) Transfers: Sit to/from [facility] Sit to Stand: Min guard Stand pivot transfers: Min guard       General transfer comment: Good rise, close guard for safelty adn cues to self-monitor for activity tolerance; dizzy not long after standing, so turned and sat to recliner  Ambulation/Gait             General Gait Details: Unable to tolerate standing upright fo rlong enough to walk  Stairs            Wheelchair Mobility    Modified Rankin (Stroke Patients Only)       Balance     Sitting balance-Leahy Scale: Good                                       Pertinent Vitals/Pain Pain Assessment: 0-10 Pain Score: 8  Pain Location: Chest pain Pain Descriptors / Indicators: Discomfort;Sore;Constant Pain Intervention(s): Monitored during session    Home Living Family/patient expects to be discharged to:: Private residence Living Arrangements: Children Available Help at Discharge: Family;Available 24 hours/day Type of Home: Apartment Home Access: Stairs to enter Entrance Stairs-Rails: Left Entrance Stairs-Number of Steps: 1 + 1+ down then a landing then 2 steps up Home Layout:  One level Home Equipment: Cane - single point;Walker - 4 wheels;Shower seat;Wheelchair - manual Additional Comments: wears 02 as needed.    Prior Function Level of Independence: Independent with assistive device(s)         Comments: Independent for household mobility sometimes furniture walking, uses [facility] for community ambulation. Walks short distances only due to SOB. Does not drive. Does some IADLs, does own ADLs. Hx of falls, wears 02 as  needed.     Hand Dominance   Dominant Hand: Right    Extremity/Trunk Assessment   Upper Extremity Assessment Upper Extremity Assessment: Defer to OT evaluation    Lower Extremity Assessment Lower Extremity Assessment: Generalized weakness RLE Deficits / Details: edematous LLE Deficits / Details: Edematous    Cervical / Trunk Assessment Cervical / Trunk Assessment: Other exceptions Cervical / Trunk Exceptions: obese  Communication   Communication: No difficulties  Cognition Arousal/Alertness: Awake/alert Behavior During Therapy: WFL for tasks assessed/performed Overall Cognitive Status: Within Functional Limits for tasks assessed                                        General Comments General comments (skin integrity, edema, etc.): Dizziness upon standing;  01/30/21 0943 01/30/21 1001 01/30/21 1003  Vital Signs  Patient Position (if appropriate) Orthostatic Vitals  --   --   Orthostatic Lying   BP- Lying 107/74  --   --   Pulse- Lying 77 (Map 81)  --   --   Orthostatic Sitting  BP- Sitting 97/69 100/82 (in recliner, head up, feet down) 100/50 (in recliner, head up, feet down; feeling lousy, and requested to put feet up)  Pulse- Sitting 79 (Map 79) 155 (155 from monitor BP reading; telemetry read "artifact") 84 (Map 56)  Orthostatic Standing at 0 minutes  BP- Standing at 0 minutes  (Unable to stand long enough for a BP reading; Lightheaded, dizzy, requested to sit)  --   --     01/30/21 1005  Vital Signs  Patient Position (if appropriate)  --   Orthostatic Lying   BP- Lying  --   Pulse- Lying  --   Orthostatic Sitting  BP- Sitting 131/71 (in recliner, head back, feet up)  Pulse- Sitting 71 (Map 87)  Orthostatic Standing at 0 minutes  BP- Standing at 0 minutes  --       Exercises     Assessment/Plan    PT Assessment Patient needs continued PT services  PT Problem List Decreased strength;Decreased mobility;Pain;Impaired  sensation;Decreased balance;Decreased activity tolerance;Cardiopulmonary status limiting activity;Decreased coordination       PT Treatment Interventions Therapeutic exercise;Patient/family education;Therapeutic activities;Functional mobility training;Balance training;Gait training;Stair training;DME instruction    PT Goals (Current goals can be found in the Care Plan section)  Acute Rehab PT Goals Patient Stated Goal: Feel better and get home PT Goal Formulation: With patient Time For Goal Achievement: 02/13/21 Potential to Achieve Goals: Good    Frequency Min 3X/week   Barriers to discharge Other (comment) Will dependen on progress medically and with activity tolerance    Co-evaluation               AM-PAC PT "6 Clicks" Mobility  Outcome Measure Help needed turning from your back to your side while in a flat bed without using bedrails?: None Help needed moving from lying on your back to sitting on the side of a flat bed without using  bedrails?: A Little Help needed moving to and from a bed to a chair (including a wheelchair)?: A Little Help needed standing up from a chair using your arms (e.g., wheelchair or bedside chair)?: A Little Help needed to walk in hospital room?: A Lot Help needed climbing 3-5 steps with a railing? : A Lot 6 Click Score: 17    End of Session Equipment Utilized During Treatment: Gait belt Activity Tolerance: Other (comment) (Limited by dizziness) Patient left: in chair;with call bell/phone within reach Nurse Communication: Mobility status PT Visit Diagnosis: Other abnormalities of gait and mobility (R26.89);Muscle weakness (generalized) (M62.81);Pain Pain - part of body:  (chest)    Time: 4503-8882 PT Time Calculation (min) (ACUTE ONLY): 40 min   Charges:   PT Evaluation $PT Eval Moderate Complexity: 1 Mod PT Treatments $Therapeutic Activity: 23-37 mins        Roney Marion, PT  Acute Rehabilitation Services Pager  [phone] Office [phone]   Colletta [place] 01/30/2021, 1:54 PM

## 2021-01-30 NOTE — Progress Notes (Addendum)
Physical Therapy Note  Evaluation complete with full note to follow;   Attempted to obtain Orthostatic BPs, unable to stand long enough for a standing read;     01/30/21 0943 01/30/21 1001 01/30/21 1003  Vital Signs  Patient Position (if appropriate) Orthostatic Vitals  --   --   Orthostatic Lying   BP- Lying 107/74  --   --   Pulse- Lying 77 (Map 81)  --   --   Orthostatic Sitting  BP- Sitting 97/69 100/82 (in recliner after pt dizzy and requested to sit, head up, feet down) 100/50 (in recliner, head up, feet down; feeling lousy, and requested to put feet up)  Pulse- Sitting 79 (Map 79) 155 (155 from monitor BP reading; telemetry read "artifact") 84 (Map 56)  Orthostatic Standing at 0 minutes  BP- Standing at 0 minutes  (Unable to stand long enough for a BP reading; Lightheaded, dizzy, requested to sit)  --   --     01/30/21 1005  Vital Signs  Patient Position (if appropriate)  --   Orthostatic Lying   BP- Lying  --   Pulse- Lying  --   Orthostatic Sitting  BP- Sitting 131/71 (in recliner, head back, feet up)  Pulse- Sitting 71 (Map 87)  Orthostatic Standing at 0 minutes  BP- Standing at 0 minutes  --     Interestingly, her lowest (observed) mean arterial pressure was at the moment when her feet were low for the longest amount of time (100/50); Her BP rebounded once her feet were raised in the recliner (131/71)  Full PT eval to follow;   Roney Marion, [place] Pager [phone] Office [phone]

## 2021-01-30 NOTE — Evaluation (Signed)
Occupational Therapy Evaluation Patient Details Name: [name] MRN: [id number] DOB: 1956-08-20 Today's Date: 01/30/2021    History of Present Illness [name] is a 65 y.o. female who presented for a 2+ day history of chest pain and shortness of breath that she feels like she is drowning; Pain does radiate down her right arm. Her shortness of breath is also new, worse with walking and laying down; with PMH significant of type 2 diabetes, chronic atrial fibrillation, chronic kidney disease, combined systolic and diastolic heart failure, OSA, recent mitral valve replacement surgery on May 11 by Dr. Roxan Hockey secondary to severe mitral regurg.   Clinical Impression   This 65 yo female admitted with above presents to acute OT with PLOF of being independent to Mod I with all basic ADLs. Currently she needs increased A for these due to orthostatic HTN when any position changes. Encouraged her to try and sit up on EOB for all meals (laying down if she needed to) to try and get her body acclimated to being upright more. We will continue to follow.    Follow Up Recommendations  Home health OT;Supervision/Assistance - 24 hour    Equipment Recommendations  None recommended by OT       Precautions / Restrictions Precautions Precautions: Fall Precaution Comments: Dizziness with standing Restrictions Weight Bearing Restrictions: Yes Other Position/Activity Restrictions: sternal precautions (had open heart surgery 4 weeks ago)      Mobility Bed Mobility Overal bed mobility: Modified Independent Bed Mobility: Supine to Sit;Sit to Supine           General bed mobility comments: HOB up    Transfers Overall transfer level: Needs assistance Equipment used: Rolling walker (2 wheeled) Transfers: Sit to/from Stand Sit to Stand: Min guard         General transfer comment: min guard due to dizziness    Balance Overall balance assessment: Needs  assistance Sitting-balance support: No upper extremity supported;Feet supported Sitting balance-Leahy Scale: Good     Standing balance support: Bilateral upper extremity supported Standing balance-Leahy Scale: Poor Standing balance comment: RW in front of her to hold onto with LUE while trying to get BP in RUE (forearm)                           ADL either performed or assessed with clinical judgement   ADL Overall ADL's : Needs assistance/impaired Eating/Feeding: Independent;Sitting   Grooming: Set up;Sitting   Upper Body Bathing: Set up;Sitting   Lower Body Bathing: Minimal assistance;Sit to/from stand   Upper Body Dressing : Set up;Sitting   Lower Body Dressing: Minimal assistance;Sit to/from stand;With adaptive equipment   Toilet Transfer: Minimal assistance;Stand-pivot;RW                   Vision Baseline Vision/History: Wears glasses Patient Visual Report: No change from baseline              Pertinent Vitals/Pain Pain Score: 0-No pain     Hand Dominance Right   Extremity/Trunk Assessment Upper Extremity Assessment Upper Extremity Assessment: Overall WFL for tasks assessed           Communication Communication Communication: No difficulties   Cognition Arousal/Alertness: Awake/alert Behavior During Therapy: WFL for tasks assessed/performed Overall Cognitive Status: Within Functional Limits for tasks assessed  Home Living Family/patient expects to be discharged to:: Private residence Living Arrangements: Children;Spouse/significant other Available Help at Discharge: Family;Available 24 hours/day Type of Home: Apartment Home Access: Stairs to enter Entrance Stairs-Number of Steps: 1 + 1+ down then a landing then 2 steps up Entrance Stairs-Rails: Left Home Layout: One level     Bathroom Shower/Tub: Teacher, early years/pre: Handicapped height      Home Equipment: Cane - single point;Walker - 4 wheels;Shower seat;Wheelchair - Scientist, physiological: Reacher;Sock aid Additional Comments: wears 02 as needed.      Prior Functioning/Environment Level of Independence: Independent with assistive device(s)        Comments: Independent for household mobility sometimes furniture walking, uses [facility] for community ambulation. Walks short distances only due to SOB. Does not drive. Does some IADLs, does own ADLs. Hx of falls, wears 02 as needed.        OT Problem List: Impaired balance (sitting and/or standing);Decreased activity tolerance;Obesity      OT Treatment/Interventions: Self-care/ADL training;DME and/or AE instruction;Patient/family education    OT Goals(Current goals can be found in the care plan section) Acute Rehab OT Goals Patient Stated Goal: to not be dizzy when I get up OT Goal Formulation: With patient Time For Goal Achievement: 02/13/21 Potential to Achieve Goals: Good  OT Frequency: Min 2X/week    AM-PAC OT "6 Clicks" Daily Activity     Outcome Measure Help from another person eating meals?: None Help from another person taking care of personal grooming?: A Little Help from another person toileting, which includes using toliet, bedpan, or urinal?: A Lot Help from another person bathing (including washing, rinsing, drying)?: A Lot Help from another person to put on and taking off regular upper body clothing?: A Little Help from another person to put on and taking off regular lower body clothing?: A Lot 6 Click Score: 16   End of Session Equipment Utilized During Treatment: Rolling walker Nurse Communication: Mobility status (BPs)  Activity Tolerance:  (limited by symptomatic orthostasis) Patient left: in bed;with bed alarm set;with family/visitor present (in chair position)  OT Visit Diagnosis: Unsteadiness on feet (R26.81);History of falling (Z91.81);Dizziness and giddiness (R42)                 Time: 7672-0947 OT Time Calculation (min): 29 min Charges:  OT General Charges $OT Visit: 1 Visit OT Evaluation $OT Eval Moderate Complexity: 1 Mod OT Treatments $Self Care/Home Management : 8-22 mins  [place] Circle, OTR/L Acute [employer] Pager [phone] Office [phone]    Almon Register 01/30/2021, 6:24 PM

## 2021-01-31 ENCOUNTER — Encounter (HOSPITAL_COMMUNITY)
Admission: EM | Disposition: A | Discharge status: Home Health | Payer: Self-pay | Source: Home / Self Care | Attending: Internal Medicine

## 2021-01-31 DIAGNOSIS — I5043 Acute on chronic combined systolic (congestive) and diastolic (congestive) heart failure: Secondary | ICD-10-CM

## 2021-01-31 DIAGNOSIS — E876 Hypokalemia: Secondary | ICD-10-CM

## 2021-01-31 DIAGNOSIS — I4819 Other persistent atrial fibrillation: Secondary | ICD-10-CM

## 2021-01-31 HISTORY — PX: RIGHT HEART CATH: CATH118263

## 2021-01-31 LAB — CBC WITH DIFFERENTIAL/PLATELET
Abs Immature Granulocytes: 0.02 10*3/uL (ref 0.00–0.07)
Basophils Absolute: 0 10*3/uL (ref 0.0–0.1)
Basophils Relative: 1 %
Eosinophils Absolute: 0.2 10*3/uL (ref 0.0–0.5)
Eosinophils Relative: 3 %
HCT: 36.1 % (ref 36.0–46.0)
Hemoglobin: 11.6 g/dL — ABNORMAL LOW (ref 12.0–15.0)
Immature Granulocytes: 0 %
Lymphocytes Relative: 33 %
Lymphs Abs: 2.3 10*3/uL (ref 0.7–4.0)
MCH: 27.4 pg (ref 26.0–34.0)
MCHC: 32.1 g/dL (ref 30.0–36.0)
MCV: 85.1 fL (ref 80.0–100.0)
Monocytes Absolute: 1 10*3/uL (ref 0.1–1.0)
Monocytes Relative: 15 %
Neutro Abs: 3.4 10*3/uL (ref 1.7–7.7)
Neutrophils Relative %: 48 %
Platelets: 260 10*3/uL (ref 150–400)
RBC: 4.24 MIL/uL (ref 3.87–5.11)
RDW: 13.6 % (ref 11.5–15.5)
WBC: 7.1 10*3/uL (ref 4.0–10.5)
nRBC: 0 % (ref 0.0–0.2)

## 2021-01-31 LAB — POCT I-STAT EG7
Acid-Base Excess: 9 mmol/L — ABNORMAL HIGH (ref 0.0–2.0)
Acid-Base Excess: 9 mmol/L — ABNORMAL HIGH (ref 0.0–2.0)
Bicarbonate: 34.3 mmol/L — ABNORMAL HIGH (ref 20.0–28.0)
Bicarbonate: 34.8 mmol/L — ABNORMAL HIGH (ref 20.0–28.0)
Calcium, Ion: 1.18 mmol/L (ref 1.15–1.40)
Calcium, Ion: 1.24 mmol/L (ref 1.15–1.40)
HCT: 36 % (ref 36.0–46.0)
HCT: 37 % (ref 36.0–46.0)
Hemoglobin: 12.2 g/dL (ref 12.0–15.0)
Hemoglobin: 12.6 g/dL (ref 12.0–15.0)
O2 Saturation: 54 %
O2 Saturation: 54 %
Potassium: 3.2 mmol/L — ABNORMAL LOW (ref 3.5–5.1)
Potassium: 3.3 mmol/L — ABNORMAL LOW (ref 3.5–5.1)
Sodium: 139 mmol/L (ref 135–145)
Sodium: 139 mmol/L (ref 135–145)
TCO2: 36 mmol/L — ABNORMAL HIGH (ref 22–32)
TCO2: 36 mmol/L — ABNORMAL HIGH (ref 22–32)
pCO2, Ven: 50.4 mmHg (ref 44.0–60.0)
pCO2, Ven: 51.2 mmHg (ref 44.0–60.0)
pH, Ven: 7.44 — ABNORMAL HIGH (ref 7.250–7.430)
pH, Ven: 7.441 — ABNORMAL HIGH (ref 7.250–7.430)
pO2, Ven: 28 mmHg — CL (ref 32.0–45.0)
pO2, Ven: 28 mmHg — CL (ref 32.0–45.0)

## 2021-01-31 LAB — BASIC METABOLIC PANEL
Anion gap: 10 (ref 5–15)
Anion gap: 15 (ref 5–15)
BUN: 64 mg/dL — ABNORMAL HIGH (ref 8–23)
BUN: 61 mg/dL — ABNORMAL HIGH (ref 8–23)
CO2: 34 mmol/L — ABNORMAL HIGH (ref 22–32)
CO2: 29 mmol/L (ref 22–32)
Calcium: 9.8 mg/dL (ref 8.9–10.3)
Calcium: 10 mg/dL (ref 8.9–10.3)
Chloride: 92 mmol/L — ABNORMAL LOW (ref 98–111)
Chloride: 92 mmol/L — ABNORMAL LOW (ref 98–111)
Creatinine, Ser: 3.55 mg/dL — ABNORMAL HIGH (ref 0.44–1.00)
Creatinine, Ser: 3.24 mg/dL — ABNORMAL HIGH (ref 0.44–1.00)
GFR, Estimated: 14 mL/min — ABNORMAL LOW (ref >60)
GFR, Estimated: 15 mL/min — ABNORMAL LOW (ref >60)
Glucose, Bld: 79 mg/dL (ref 70–99)
Glucose, Bld: 92 mg/dL (ref 70–99)
Potassium: 2.9 mmol/L — ABNORMAL LOW (ref 3.5–5.1)
Potassium: 3.6 mmol/L (ref 3.5–5.1)
Sodium: 136 mmol/L (ref 135–145)
Sodium: 136 mmol/L (ref 135–145)

## 2021-01-31 LAB — GLUCOSE, CAPILLARY
Glucose-Capillary: 188 mg/dL — ABNORMAL HIGH (ref 70–99)
Glucose-Capillary: 74 mg/dL (ref 70–99)
Glucose-Capillary: 165 mg/dL — ABNORMAL HIGH (ref 70–99)
Glucose-Capillary: 165 mg/dL — ABNORMAL HIGH (ref 70–99)
Glucose-Capillary: 221 mg/dL — ABNORMAL HIGH (ref 70–99)

## 2021-01-31 LAB — HEMOGLOBIN A1C
Hgb A1c MFr Bld: 7.4 % — ABNORMAL HIGH (ref 4.8–5.6)
Mean Plasma Glucose: 166 mg/dL

## 2021-01-31 SURGERY — RIGHT HEART CATH
Anesthesia: LOCAL

## 2021-01-31 MED ORDER — LIDOCAINE HCL (PF) 1 % IJ SOLN
INTRAMUSCULAR | Status: DC | PRN
  Administered 2021-01-31: 3 mL

## 2021-01-31 MED ORDER — LABETALOL HCL 5 MG/ML IV SOLN: 10.0000 mg | INTRAVENOUS | Status: AC | PRN

## 2021-01-31 MED ORDER — METOPROLOL SUCCINATE ER 50 MG PO TB24
50.0000 mg | ORAL_TABLET | Freq: Every day | ORAL | Status: DC
  Administered 2021-01-31: 50 mg via ORAL
  Filled 2021-01-31 (×2): qty 1

## 2021-01-31 MED ORDER — ACETAMINOPHEN 325 MG PO TABS: 650.0000 mg | ORAL_TABLET | ORAL | Status: DC | PRN

## 2021-01-31 MED ORDER — SODIUM CHLORIDE 0.9 % IV SOLN: INTRAVENOUS | Status: DC

## 2021-01-31 MED ORDER — SODIUM CHLORIDE 0.9% FLUSH: 3.0000 mL | INTRAVENOUS | Status: DC | PRN

## 2021-01-31 MED ORDER — SODIUM CHLORIDE 0.9 % IV SOLN: 250.0000 mL | INTRAVENOUS | Status: DC | PRN

## 2021-01-31 MED ORDER — POTASSIUM CHLORIDE 10 MEQ/100ML IV SOLN
10.0000 meq | INTRAVENOUS | Status: AC
  Administered 2021-01-31 (×3): 10 meq via INTRAVENOUS
  Filled 2021-01-31 (×3): qty 100

## 2021-01-31 MED ORDER — APIXABAN 5 MG PO TABS
5.0000 mg | ORAL_TABLET | Freq: Two times a day (BID) | ORAL | Status: DC
  Administered 2021-02-01 – 2021-02-02 (×4): 5 mg via ORAL
  Filled 2021-01-31 (×4): qty 1

## 2021-01-31 MED ORDER — LIDOCAINE HCL (PF) 1 % IJ SOLN
INTRAMUSCULAR | Status: AC
  Filled 2021-01-31: qty 30

## 2021-01-31 MED ORDER — HEPARIN (PORCINE) IN NACL 1000-0.9 UT/500ML-% IV SOLN
INTRAVENOUS | Status: DC | PRN
  Administered 2021-01-31: 500 mL

## 2021-01-31 MED ORDER — SODIUM CHLORIDE 0.9% FLUSH
3.0000 mL | Freq: Two times a day (BID) | INTRAVENOUS | Status: DC
  Administered 2021-02-01 – 2021-02-02 (×3): 3 mL via INTRAVENOUS

## 2021-01-31 MED ORDER — HYDRALAZINE HCL 20 MG/ML IJ SOLN: 10.0000 mg | INTRAMUSCULAR | Status: AC | PRN

## 2021-01-31 MED ORDER — POTASSIUM CHLORIDE CRYS ER 20 MEQ PO TBCR
40.0000 meq | EXTENDED_RELEASE_TABLET | Freq: Once | ORAL | Status: AC
  Administered 2021-01-31: 40 meq via ORAL
  Filled 2021-01-31: qty 2

## 2021-01-31 MED ORDER — APIXABAN 5 MG PO TABS
5.0000 mg | ORAL_TABLET | ORAL | Status: AC
  Administered 2021-01-31: 5 mg via ORAL
  Filled 2021-01-31: qty 1

## 2021-01-31 MED ORDER — SODIUM CHLORIDE 0.9% FLUSH: 3.0000 mL | Freq: Two times a day (BID) | INTRAVENOUS | Status: DC

## 2021-01-31 MED ORDER — HEPARIN (PORCINE) IN NACL 1000-0.9 UT/500ML-% IV SOLN
INTRAVENOUS | Status: AC
  Filled 2021-01-31: qty 500

## 2021-01-31 MED ORDER — ONDANSETRON HCL 4 MG/2ML IJ SOLN
4.0000 mg | Freq: Four times a day (QID) | INTRAMUSCULAR | Status: DC | PRN
  Administered 2021-02-01: 4 mg via INTRAVENOUS
  Filled 2021-01-31: qty 2

## 2021-01-31 SURGICAL SUPPLY — 8 items
CATH BALLN WEDGE 5F 110CM (CATHETERS) ×2 IMPLANT
PACK CARDIAC CATHETERIZATION (CUSTOM PROCEDURE TRAY) ×2 IMPLANT
PROTECTION STATION PRESSURIZED (MISCELLANEOUS) ×2
SHEATH GLIDE SLENDER 4/5FR (SHEATH) ×2 IMPLANT
SHEATH PROBE COVER 6X72 (BAG) ×2 IMPLANT
STATION PROTECTION PRESSURIZED (MISCELLANEOUS) ×1 IMPLANT
TRANSDUCER W/STOPCOCK (MISCELLANEOUS) ×2 IMPLANT
WIRE EMERALD 3MM-J .025X260CM (WIRE) ×2 IMPLANT

## 2021-01-31 NOTE — H&P (View-Only) (Signed)
Progress Note  Patient Name: [name] Date of Encounter: 01/31/2021  [facility] Cardiologist: Buford Dresser, MD   Subjective   Was orthostatic this AM. Tried to use the restroom but became dizzy/lightheaded. Feels fatigued. Making concentrated urine but Cr rising. Discussed options for further evaluation, see below.  Inpatient Medications    Scheduled Meds:  amiodarone  200 mg Oral Daily   apixaban  5 mg Oral BID   cycloSPORINE  1 drop Both Eyes BID   DULoxetine  60 mg Oral Daily   insulin aspart  0-15 Units Subcutaneous TID WC   insulin aspart  5 Units Subcutaneous TID AC   insulin detemir  50 Units Subcutaneous BID   metolazone  5 mg Oral Daily   metoprolol succinate  50 mg Oral Daily   multivitamin with minerals  1 tablet Oral Daily   pantoprazole  40 mg Oral BID   potassium chloride  40 mEq Oral Daily   rosuvastatin  10 mg Oral Daily   sodium chloride flush  3 mL Intravenous Q12H   Continuous Infusions:  sodium chloride     sodium chloride     furosemide 120 mg (01/31/21 0750)   potassium chloride     PRN Meds: sodium chloride, albuterol, nitroGLYCERIN, oxyCODONE, sodium chloride flush   Vital Signs    Vitals:   01/30/21 1541 01/30/21 2127 01/31/21 0500 01/31/21 0530  BP: (!) 103/58 111/63  (!) 115/57  Pulse: 79 70  67  Resp: 18 20  17   Temp: 98.5 F (36.9 C) 97.6 F (36.4 C)  98.5 F (36.9 C)  TempSrc: Oral Oral  Oral  SpO2: 100% 99%  100%  Weight:   113.7 kg   Height:        Intake/Output Summary (Last 24 hours) at 01/31/2021 0945 Last data filed at 01/31/2021 0819 Gross per 24 hour  Intake 466.06 ml  Output 1125 ml  Net -658.94 ml   Last 3 Weights 01/31/2021 01/30/2021 01/29/2021  Weight (lbs) 250 lb 9.6 oz 248 lb 14.4 oz 250 lb  Weight (kg) 113.671 kg 112.9 kg 113.4 kg  Some encounter information is confidential and restricted. Go to Review Flowsheets activity to see all data.      Telemetry    Rate controlled atrial  fibrillation - Personally Reviewed  ECG    01/29/21 afib 105 bpm- Personally Reviewed  Physical Exam   GEN: No acute distress.   Neck: No JVD appreciated but difficult bodhabitus Cardiac: RRR, no murmurs, rubs, or gallops.  Respiratory: Clear to auscultation bilaterally in upper fields, rales in bilateral bases GI: Soft, nontender, non-distended  MS: No pitting edema, legs are soft, warm, well perfused Neuro:  Nonfocal  Psych: Normal affect   Labs    High Sensitivity Troponin:   Recent Labs  Lab 01/29/21 1104 01/29/21 1304  TROPONINIHS 20* 18*      Chemistry Recent Labs  Lab 01/25/21 1116 01/29/21 1104 01/30/21 0211 01/30/21 1443 01/31/21 0640  NA 134* 136 134*  --  136  K 2.8* 2.5* 3.1* 3.5 2.9*  CL 88* 89* 89*  --  92*  CO2 32 34* 36*  --  34*  GLUCOSE 234* 207* 171*  --  79  BUN 63* 57* 56*  --  64*  CREATININE 3.53* 3.58* 3.23*  --  3.55*  CALCIUM 10.2 9.7 9.9  --  9.8  PROT 9.8* 8.6*  --   --   --   ALBUMIN 3.8 3.5  --   --   --  AST 35 39  --   --   --   ALT 29 33  --   --   --   ALKPHOS 121 94  --   --   --   BILITOT 1.0 0.8  --   --   --   GFRNONAA 14* 14* 15*  --  14*  ANIONGAP 14 13 9   --  10     Hematology Recent Labs  Lab 01/25/21 1116 01/29/21 1104 01/31/21 0640  WBC 5.6 6.6 7.1  RBC 4.53 4.25 4.24  HGB 12.2 11.5* 11.6*  HCT 39.0 36.5 36.1  MCV 86.1 85.9 85.1  MCH 26.9 27.1 27.4  MCHC 31.3 31.5 32.1  RDW 13.5 13.6 13.6  PLT 173 239 260    BNP Recent Labs  Lab 01/29/21 1104  BNP 33.6     DDimer No results for input(s): DDIMER in the last 168 hours.   Radiology    DG Chest Portable 1 View  Result Date: 01/29/2021 CLINICAL DATA:  Chest pain.  Recent open heart surgery EXAM: PORTABLE CHEST 1 VIEW COMPARISON:  01/09/2021 FINDINGS: Cardiomegaly. Prior mitral and tricuspid valve repair. Left atrial clipping. There is no edema, consolidation, effusion, or pneumothorax. IMPRESSION: Stable postoperative chest.  No evidence of  active disease. Electronically Signed   By: Monte Fantasia M.D.   On: 01/29/2021 11:32    Cardiac Studies   Echo 01/02/21  1. No subcostal views; Elevated mean gradient across aortic valve (mean  gradient 13 mmHg) but visually valve opens well; S/P MV repair with mean  gradient 7 mmHg and trace MR; S/P TV repair with moderate TR.   2. Left ventricular ejection fraction, by estimation, is 60 to 65%. The  left ventricle has normal function. The left ventricle has no regional  wall motion abnormalities. There is moderate left ventricular hypertrophy.  There is the interventricular  septum is flattened in systole and diastole, consistent with right  ventricular pressure and volume overload.   3. Right ventricular systolic function is normal. The right ventricular  size is mildly enlarged.   4. Left atrial size was moderately dilated.   5. Right atrial size was mildly dilated.   6. The mitral valve has been repaired/replaced. Trivial mitral valve  regurgitation. No evidence of mitral stenosis.   7. The tricuspid valve is has been repaired/replaced. Tricuspid valve  regurgitation is moderate.   8. The aortic valve is tricuspid. Aortic valve regurgitation is not  visualized. No aortic stenosis is present.  Patient Profile     65 y.o. 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 She has had a complicated postoperative course including need for inotropes, acute kidney injury on chronic kidney disease. She presented with shortness of breath and presumed volume overload.  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 With her rising Cr, I am concerned about placing a PICC line. I will arrange for right heart cath this afternoon for evaluation of her filling pressures and cardiac output. -Risks and benefits of cardiac catheterization have been discussed with the patient.  These  include bleeding, infection, kidney damage, stroke, heart attack, death.  The patient understands these risks and is willing to proceed.  Orthostasis: -another concerning feature that has me questioning volume status -decreasing metoprolol this AM  Hypokalemia: -K 2.9 this AM -receiving 40 mEq PO, I will also give 40 meQ IV this morning.  Persistent afib s/p MAZE -continue amiodarone, apixaban -planned for cardioversion as an outpatient -would prefer to do RHC on apixaban  Hyperlipidemia: -continue rosuvastatin for now, cautious given renal function  For questions or updates, please contact [place] Please consult [email or web address] for contact info under        Signed, Buford Dresser, MD  01/31/2021, 9:45 AM

## 2021-01-31 NOTE — Consult Note (Addendum)
Advanced Heart Failure Team Consult Note   Primary Physician: [name], NP PCP-Cardiologist:  Buford Dresser, MD  Reason for Consultation: Heart Failure   HPI:    [name] is seen today for evaluation of heart failure at the request of Dr Harrell Gave   Ms Heikkila is a 65 year old with a history of  morbid obesity, chronic diastolic and systolic HF with mildly reduced LVEF (45-50%), severe MR/TR, atrial fibrillation on eliquis, HTN, and DMII. She has a known history of HF with mildly reduced LVEF of 45-50% with cath in 2019 with mild, non-obstructive CAD.   Also with history of difficult to control Afib with failed Tikosyn, later switched to amiodarone, dig, metop and apixaban for [facility].    Admitted 12/19/20 with worsening HF.  LHC 5/19 non-obstructive CAD. RHC 12/22/20 with elevated filling pressures and low CI.   Underwent MV/TV repair with Maze on 12/30/2020. Post-operatively, she struggled  with low co-ox, progressive renal failure and volume overload. Co-ox 48% while on milrinone 0.125. Cr 1.5. AHF team was consulted to assist w/ further management. Milrinone was increased and she was placed on lasix gtt. Post operative Echo showed LVEF 55% RV moderately HK. Moderate TR. No significant MR. No effusion. Hemodynamics improved w/ milrinone. Volume status improved as well as renal function. Suspected of having post-op ATN. Nephrology was also on board and assited w/ transition back to PO diuretics. She was able to wean off milrinone w/ stable co-ox. She was discharged on 5/28. Discharge weight 281 pounds. Creatinine at the time of d/c was 2.8. Nephrology recommended home on torsemide 60 mg daily. She was also in Afib day of d/c. Recommendations were to consider DC-CV in several weeks after atrial irritation settles down from Maze. No SGLT2in  with GFR 19.   She returned to HF clinic 01/25/21. Reds Clip 42%. Torsemide was increased to 80 mg twice a day x1 then back to  torsemide to 80 mg daily. Plan for  DC/CV 02/05/21.   Presented to [facility] with increased shortness of breath and chest pain. CXR with no acute findings. EKG with A fib 105 bpm. Creatinine 3.5 on admit. She has been diuresing with high dose IV lasix. Set up [place] today to assess hemodynamics and showed low filling pressures.  Diuretics have been held. See below.   RHC 01/31/21 RA = 5 RV = 33/2 PA = 35/13 (22) PCW = 13 Fick cardiac output/index = 4.3/2.0 PVR = 2.1 WU FA sat = 97% PA sat = 54%, 54%  Assessment: 1. Low filling pressures with moderately reduced CO.  12/2020 Echo LVEF 55% RV moderately HK. Moderate TR. No significant MR. No effusion.  Review of Systems: [y] = yes, [ ]  = no   General: Weight gain [ ] ; Weight loss [ ] ; Anorexia [ ] ; Fatigue [ Y]; Fever [ ] ; Chills [ ] ; Weakness [ ]   Cardiac: Chest pain/pressure [ ] ; Resting SOB [ ] ; Exertional SOB [ Y]; Orthopnea [ Y]; Pedal Edema [ ] ; Palpitations [ ] ; Syncope [ ] ; Presyncope [ ] ; Paroxysmal nocturnal dyspnea[ ]   Pulmonary: Cough [ ] ; Wheezing[ ] ; Hemoptysis[ ] ; Sputum [ ] ; Snoring [ ]   GI: Vomiting[ ] ; Dysphagia[ ] ; Melena[ ] ; Hematochezia [ ] ; Heartburn[ ] ; Abdominal pain [ ] ; Constipation [ ] ; Diarrhea [ ] ; BRBPR [ ]   GU: Hematuria[ ] ; Dysuria [ ] ; Nocturia[ ]   Vascular: Pain in legs with walking [ ] ; Pain in feet with lying flat [ ] ; Non-healing sores [ ] ;  Stroke [ ] ; TIA [ ] ; Slurred speech [ ] ;  Neuro: Headaches[ ] ; Vertigo[ ] ; Seizures[ ] ; Paresthesias[ ] ;Blurred vision [ ] ; Diplopia [ ] ; Vision changes [ ]   Ortho/Skin: Arthritis [ ] ; Joint pain [ Y]; Muscle pain [ ] ; Joint swelling [ ] ; Back Pain [ Y]; Rash [ ]   Psych: Depression[ ] ; Anxiety[ ]   Heme: Bleeding problems [ ] ; Clotting disorders [ ] ; Anemia [ ]   Endocrine: Diabetes [Y ]; Thyroid dysfunction[ ]   Home Medications Prior to Admission medications   Medication Sig Start Date End Date Taking? Authorizing Provider  Accu-Chek Softclix Lancets lancets Use as instructed  to check blood sugar three times daily. E11.69 Patient taking differently: 1 each by Other route See admin instructions. Use as instructed to check blood sugar three times daily. E11.69 04/21/20  Yes Newlin, Charlane Ferretti, MD  albuterol (VENTOLIN HFA) 108 (90 Base) MCG/ACT inhaler Inhale 2 puffs into the lungs every 4 (four) hours as needed for wheezing or shortness of breath.  08/15/19  Yes [provider]  Alcohol Swabs (B-D SINGLE USE SWABS REGULAR) PADS 1 each by Other route daily. 02/19/19  Yes [provider]  amiodarone (PACERONE) 200 MG tablet Take 1 tablet (200 mg total) by mouth daily. 01/25/21  Yes Lyda Jester M, PA-C  apixaban (ELIQUIS) 5 MG TABS tablet Take 1 tablet (5 mg total) by mouth 2 (two) times daily. 11/09/20  Yes Buford Dresser, MD  B-D ULTRAFINE III SHORT PEN 31G X 8 MM MISC 1 each by Other route in the morning, at noon, in the evening, and at bedtime. 11/18/20  Yes [provider]  Blood Glucose Monitoring Suppl (ACCU-CHEK GUIDE ME) w/Device KIT Use as instructed to check blood sugar three times daily. E11.69 04/21/20  Yes Charlott Rakes, MD  Blood Glucose Monitoring Suppl (ACCU-CHEK GUIDE) w/Device KIT 1 each by Other route in the morning, at noon, and at bedtime. 04/21/20  Yes [provider]  DULoxetine (CYMBALTA) 60 MG capsule Take 1 capsule (60 mg total) by mouth daily. 09/24/20  Yes [name], NP  EPINEPHrine 0.3 mg/0.3 mL IJ SOAJ injection Inject 0.3 mg into the muscle as needed for anaphylaxis. 06/25/20  Yes [name], NP  furosemide (LASIX) 40 MG tablet Take 40 mg by mouth 2 (two) times daily. 01/01/21  Yes [provider]  insulin aspart (NOVOLOG) 100 UNIT/ML injection Inject 12 Units into the skin 3 (three) times daily before meals. For blood sugars 0-199 give 0 units of insulin, 201-250 give 4 units, 251-300 give 6 units, 301-350 give 8 units, 351-400 give 10 units,> 400 give 12 units and call M.D. Patient  taking differently: Inject 5 Units into the skin 3 (three) times daily before meals. For blood sugars 0-199 give 0 units of insulin, 201-250 give 4 units, 251-300 give 6 units, 301-350 give 8 units, 351-400 give 7. 06/24/20  Yes Edwards, Michelle P, NP  LEVEMIR 100 UNIT/ML injection INJECT 0.4 MLS (40 UNITS TOTAL) INTO THE SKIN 2 (TWO) TIMES DAILY. Patient taking differently: Inject 50 Units into the skin 2 (two) times daily. 12/01/20  Yes [name], NP  metolazone (ZAROXOLYN) 5 MG tablet Take 5 mg by mouth daily. 01/05/21  Yes [provider]  metoprolol succinate (TOPROL-XL) 100 MG 24 hr tablet Take 100 mg by mouth daily. 01/18/21  Yes [provider]  Multiple Vitamins-Minerals (MULTIVITAMIN WOMEN PO) Take 1 tablet by mouth daily.    Yes [provider]  nitroGLYCERIN (NITROSTAT) 0.4  MG SL tablet Place 1 tablet (0.4 mg total) under the tongue every 5 (five) minutes x 3 doses as needed for chest pain. 01/08/14  Yes Rinaldo Cloud, MD  oxyCODONE (OXY IR/ROXICODONE) 5 MG immediate release tablet Take 1 tablet (5 mg total) by mouth every 4 (four) hours as needed for severe pain. 01/12/21  Yes Barrett, Erin R, PA-C  pantoprazole (PROTONIX) 40 MG tablet Take 40 mg by mouth 2 (two) times daily. 12/27/20  Yes [provider]  Potassium Chloride ER 20 MEQ TBCR Take 20 mEq by mouth daily. 11/05/20  Yes Sheilah Pigeon, PA-C  RESTASIS 0.05 % ophthalmic emulsion Place 1 drop into both eyes 2 (two) times daily. 11/19/17  Yes [provider]  rosuvastatin (CRESTOR) 10 MG tablet Take 1 tablet (10 mg total) by mouth daily. 09/24/20  Yes Grayce Sessions, NP  torsemide (DEMADEX) 20 MG tablet Take 4 tablets (80 mg total) by mouth daily. 01/25/21  Yes Simmons, Brittainy M, PA-C  TRUE METRIX BLOOD GLUCOSE TEST test strip USE AS INSTRUCTED TO CHECK BLOOD SUGAR DAILY. Patient taking differently: 1 each by Other route daily. 07/21/20  Yes Hoy Register, MD  TRULICITY 1.5  MG/0.5ML SOPN INJECT 1.5 MG INTO THE SKIN ONCE A WEEK. 12/05/20  Yes Grayce Sessions, NP    Past Medical History: Past Medical History:  Diagnosis Date   Allergic rhinitis    Arthritis    Asthma    Chest pain 12/27/2017   Chronic diastolic CHF (congestive heart failure) (HCC)    COPD (chronic obstructive pulmonary disease) (HCC)    Depression    DM (diabetes mellitus) (HCC)    DVT (deep vein thrombosis) in pregnancy    Dysrhythmia    atrial fibrilation   GERD (gastroesophageal reflux disease)    Headache(784.0)    HTN (hypertension)    Hyperlipidemia    Obesity    Pneumonia 04/2018   RIGHT LOBE   Shortness of breath    Sleep apnea    compliant with CPAP    Past Surgical History: Past Surgical History:  Procedure Laterality Date   ABDOMINAL HYSTERECTOMY     BUBBLE STUDY  11/26/2020   Procedure: BUBBLE STUDY;  Surgeon: Jodelle Red, MD;  Location: [facility] ENDOSCOPY;  Service: Cardiovascular;;   CARDIOVERSION N/A 05/06/2020   Procedure: CARDIOVERSION;  Surgeon: Jodelle Red, MD;  Location: [facility] ENDOSCOPY;  Service: Cardiovascular;  Laterality: N/A;   CARDIOVERSION N/A 11/04/2020   Procedure: CARDIOVERSION;  Surgeon: Lewayne Bunting, MD;  Location: [facility] ENDOSCOPY;  Service: Cardiovascular;  Laterality: N/A;   CATARACT EXTRACTION, BILATERAL     CHOLECYSTECTOMY     COLONOSCOPY WITH PROPOFOL N/A 03/05/2015   Procedure: COLONOSCOPY WITH PROPOFOL;  Surgeon: Jeani Hawking, MD;  Location: [facility] ENDOSCOPY;  Service: Endoscopy;  Laterality: N/A;   DIAGNOSTIC LAPAROSCOPY     ingrown hallux Left    KNEE SURGERY     LEFT HEART CATH AND CORONARY ANGIOGRAPHY N/A 12/31/2017   Procedure: LEFT HEART CATH AND CORONARY ANGIOGRAPHY;  Surgeon: Rinaldo Cloud, MD;  Location: [facility] INVASIVE CV LAB;  Service: Cardiovascular;  Laterality: N/A;   MAZE N/A 12/29/2020   Procedure: MAZE;  Surgeon: Loreli Slot, MD;  Location: [facility] OR;  Service: Open Heart Surgery;  Laterality: N/A;   MITRAL  VALVE REPAIR N/A 12/29/2020   Procedure: MITRAL VALVE REPAIR USING CARBOMEDICS ANNULOFLEX RING SIZE 30;  Surgeon: Loreli Slot, MD;  Location: [facility] OR;  Service: Open Heart Surgery;  Laterality: N/A;  RIGHT HEART CATH N/A 12/22/2020   Procedure: RIGHT HEART CATH;  Surgeon: Larey Dresser, MD;  Location: [place] CV LAB;  Service: Cardiovascular;  Laterality: N/A;   TEE WITHOUT CARDIOVERSION N/A 11/26/2020   Procedure: TRANSESOPHAGEAL ECHOCARDIOGRAM (TEE);  Surgeon: Buford Dresser, MD;  Location: [facility] ENDOSCOPY;  Service: Cardiovascular;  Laterality: N/A;   TEE WITHOUT CARDIOVERSION N/A 12/29/2020   Procedure: TRANSESOPHAGEAL ECHOCARDIOGRAM (TEE);  Surgeon: [name], MD;  Location: [place];  Service: Open Heart Surgery;  Laterality: N/A;   TRICUSPID VALVE REPLACEMENT N/A 12/29/2020   Procedure: 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 RING SIZE 34;  Surgeon: [name], MD;  Location: [place];  Service: Open Heart Surgery;  Laterality: N/A;    Family History: Family History  Problem Relation Age of Onset   Cancer Mother    Hypertension Mother    Cancer Father    CAD Other    Hypertension Sister    Hypertension Brother     Social History: Social History   Socioeconomic History   Marital status: Significant Other    Spouse name: Not on file   Number of children: 2   Years of education: Not on file   Highest education level: Some college, no degree  Occupational History   Not on file  Tobacco Use   Smoking status: Never   Smokeless tobacco: Never  Vaping Use   Vaping Use: Never used  Substance and Sexual Activity   Alcohol use: No   Drug use: No   Sexual activity: Not Currently  Other Topics Concern   Not on file  Social History Narrative   Pt lives in [place] with spouse.  2 grown children.   Previously worked in [employer] at [employer].  Now on disability   Social Determinants of Health   Financial Resource Strain: Medium Risk    Difficulty of Paying Living Expenses: Somewhat hard  Food Insecurity: No Food Insecurity   Worried About Charity fundraiser in the Last Year: Never true   Ran Out of Food in the Last Year: Never true  Transportation Needs: Unmet Transportation Needs   Lack of Transportation (Medical): Yes   Lack of Transportation (Non-Medical): No  Physical Activity: Not on file  Stress: Not on file  Social Connections: Not on file    Allergies:  Allergies  Allergen Reactions   Sulfa Antibiotics Itching    Objective:    Vital Signs:   Temp:  [97.6 F (36.4 C)-98.5 F (36.9 C)] 97.6 F (36.4 C) (06/13 1454) Pulse Rate:  [67-94] 83 (06/13 1454) Resp:  [17-21] 19 (06/13 1454) BP: (103-137)/(57-84) 124/62 (06/13 1454) SpO2:  [96 %-100 %] 96 % (06/13 1454) Weight:  [113.7 kg] 113.7 kg (06/13 0500) Last BM Date: 01/28/21 (per pt)  Weight change: Filed Weights   01/29/21 1700 01/30/21 0517 01/31/21 0500  Weight: (P) 113.4 kg 112.9 kg 113.7 kg    Intake/Output:   Intake/Output Summary (Last 24 hours) at 01/31/2021 1505 Last data filed at 01/31/2021 0819 Gross per 24 hour  Intake 416.06 ml  Output 675 ml  Net -258.94 ml      Physical Exam    General:   No resp difficulty HEENT: normal Neck: supple. JVP5-6  Carotids 2+ bilat; no bruits. No lymphadenopathy or thyromegaly appreciated. Cor: PMI nondisplaced. Irregular rate & rhythm. No rubs, gallops or murmurs. Lungs: clear Abdomen: soft, nontender, nondistended. No hepatosplenomegaly. No bruits or masses. Good bowel sounds. Extremities: no cyanosis, clubbing,  rash, edema Neuro: alert & orientedx3, cranial nerves grossly intact. moves all 4 extremities w/o difficulty. Affect pleasant   Telemetry  A fib   EKG   A fib 105 bpm personally reviewed.   Labs   Basic Metabolic Panel: Recent Labs  Lab 01/25/21 1116 01/29/21 1104 01/29/21 2146 01/30/21 0211 01/30/21 1443 01/31/21 0640  NA 134* 136  --  134*  --  136  K 2.8*  2.5*  --  3.1* 3.5 2.9*  CL 88* 89*  --  89*  --  92*  CO2 32 34*  --  36*  --  34*  GLUCOSE 234* 207*  --  171*  --  79  BUN 63* 57*  --  56*  --  64*  CREATININE 3.53* 3.58*  --  3.23*  --  3.55*  CALCIUM 10.2 9.7  --  9.9  --  9.8  MG  --   --  1.9  --  2.0  --     Liver Function Tests: Recent Labs  Lab 01/25/21 1116 01/29/21 1104  AST 35 39  ALT 29 33  ALKPHOS 121 94  BILITOT 1.0 0.8  PROT 9.8* 8.6*  ALBUMIN 3.8 3.5   No results for input(s): LIPASE, AMYLASE in the last 168 hours. No results for input(s): AMMONIA in the last 168 hours.  CBC: Recent Labs  Lab 01/25/21 1116 01/29/21 1104 01/31/21 0640  WBC 5.6 6.6 7.1  NEUTROABS  --  3.6 3.4  HGB 12.2 11.5* 11.6*  HCT 39.0 36.5 36.1  MCV 86.1 85.9 85.1  PLT 173 239 260    Cardiac Enzymes: No results for input(s): CKTOTAL, CKMB, CKMBINDEX, TROPONINI in the last 168 hours.  BNP: BNP (last 3 results) Recent Labs    11/22/20 1527 12/19/20 1016 01/29/21 1104  BNP 132.8* 227.9* 33.6    ProBNP (last 3 results) No results for input(s): PROBNP in the last 8760 hours.   CBG: Recent Labs  Lab 01/30/21 1122 01/30/21 1541 01/30/21 2131 01/31/21 0611 01/31/21 1153  GLUCAP 191* 175* 168* 74 165*    Coagulation Studies: No results for input(s): LABPROT, INR in the last 72 hours.   Imaging   CARDIAC CATHETERIZATION  Result Date: 01/31/2021 Findings: RA = 5 RV = 33/2 PA = 35/13 (22) PCW = 13 Fick cardiac output/index = 4.3/2.0 PVR = 2.1 WU FA sat = 97% PA sat = 54%, 54% Assessment: 1. Low filling pressures with moderately reduced CO. Plan/Discussion: Would hold diuretics for now. Hopefully CO will improve with DC-CV. Glori Bickers, MD 2:48 PM    Medications:     Current Medications:  amiodarone  200 mg Oral Daily   apixaban  5 mg Oral BID   cycloSPORINE  1 drop Both Eyes BID   DULoxetine  60 mg Oral Daily   insulin aspart  0-15 Units Subcutaneous TID WC   insulin aspart  5 Units Subcutaneous  TID AC   insulin detemir  50 Units Subcutaneous BID   metolazone  5 mg Oral Daily   metoprolol succinate  50 mg Oral Daily   multivitamin with minerals  1 tablet Oral Daily   pantoprazole  40 mg Oral BID   potassium chloride  40 mEq Oral Daily   rosuvastatin  10 mg Oral Daily   sodium chloride flush  3 mL Intravenous Q12H    Infusions:  sodium chloride     furosemide 120 mg (01/31/21 0750)      Assessment/Plan   A/C  Systolic Heart Failure  -TEE 4/22 EF 45-50% RV moderately HK - Echo LVEF 55% RV moderately HK. Moderate TR. No significant MR. No effusion. - Diuresed on admit. Creatinine has been elevated.  -RHC today with low filling pressures. Stop lasix + metolazone  -No SGLT2i with GFR < 20 - No spiro/ari with creatinine 3.5  - Follow BMET   2. A fib  -Rate controlled - Continue amio 200 mg daily  - Continue eliquis 5 mg twice a day   3. AKI  -Creatinine on admit 3.5. -Had RHC today with low filling pressures. Diuretics held.  - Check BMET   4. Valvular Heart Disease S/P MVR/ TVR  -Severe TR s/p annuloplasty. Severe MR s/p annuloplasty on 12/30/20 - Echo 01/02/21 LVEF 55% RV moderately HK. Moderate TR. No significant MR. No effusion - Will let CT surgery know   5. Obesity  Body mass index is 40.45 kg/m (pended).  6. DMII  On SSI   Length of Stay: 2  Amy Clegg, NP  01/31/2021, 3:05 PM  Advanced Heart Failure Team Pager [phone] (M-F; 7a - 5p)  Please contact [facility] for night-coverage after hours (4p -7a ) and weekends on [email or web address]   Patient seen and examined with the above-signed Advanced Practice Provider and/or Housestaff. I personally reviewed laboratory data, imaging studies and relevant notes. I independently examined the patient and formulated the important aspects of the plan. I have edited the note to reflect any of my changes or salient points. I have personally discussed the plan with the patient and/or family.  Continues with NYHA III  symptoms after recent MVR/TVR. EF 55%. RHC shows low filling pressures and moderately reduced filling pressures. Remains in AF. Creatinine hovering around 3.2-3.5  General:  Lying flat No resp difficulty HEENT: normal Neck: supple. no JVD. Carotids 2+ bilat; no bruits. No lymphadenopathy or thryomegaly appreciated. Cor: PMI nondisplaced. Regular rate & rhythm. No rubs, gallops or murmurs. Lungs: clear Abdomen: obese soft, nontender, nondistended. No hepatosplenomegaly. No bruits or masses. Good bowel sounds. Extremities: no cyanosis, clubbing, rash, edema Neuro: alert & orientedx3, cranial nerves grossly intact. moves all 4 extremities w/o difficulty. Affect pleasant  Hopefully renal function will get some better with holding diuretics but I think main issue may likely be progressive renal failure and may be nearing HD. No role for intotropes currently. Will plan DC-CV this week to see if we can restore NSR and help with cardiac output.   Glori Bickers, MD  4:43 PM

## 2021-01-31 NOTE — Consult Note (Signed)
[facility]  HISTORY AND PHYSICAL  [name] is an 65 y.o. female.    Chief Complaint: shortness of breath  HPI: Pt is a 30F with a PMH sig for Afib, systolic CHF, and recent MV/TV repair with MAZE procedure who is now seen in consultation at the request of Dr Si Raider for eval and recs re: progressive CKD vs Aki.    Pt was admitted 12/2020 for MV/ TV repair and MAZE procedure.  Had a postoperative course that was characterized by low output and AKI thought to be r/t ATN vs cardiorenal syndrome.  Discharge Cr was 2.8.  She was seen several times in cardiology followup since discharge and has reported issues including nausea, poor appetite, and SOB.    She was admitted 01/29/21 for these concerns.  Cr was 3.5 on admission.  She is currently unavailable for interview as she is getting a RHC.    PMH: Past Medical History:  Diagnosis Date   Allergic rhinitis    Arthritis    Asthma    Chest pain 12/27/2017   Chronic diastolic CHF (congestive heart failure) (HCC)    COPD (chronic obstructive pulmonary disease) (HCC)    Depression    DM (diabetes mellitus) ([place])    DVT (deep vein thrombosis) in pregnancy    Dysrhythmia    atrial fibrilation   GERD (gastroesophageal reflux disease)    Headache(784.0)    HTN (hypertension)    Hyperlipidemia    Obesity    Pneumonia 04/2018   RIGHT LOBE   Shortness of breath    Sleep apnea    compliant with CPAP   PSH: Past Surgical History:  Procedure Laterality Date   ABDOMINAL HYSTERECTOMY     BUBBLE STUDY  11/26/2020   Procedure: BUBBLE STUDY;  Surgeon: Buford Dresser, MD;  Location: [place];  Service: Cardiovascular;;   CARDIOVERSION N/A 05/06/2020   Procedure: CARDIOVERSION;  Surgeon: Buford Dresser, MD;  Location: [facility] ENDOSCOPY;  Service: Cardiovascular;  Laterality: N/A;   CARDIOVERSION N/A 11/04/2020   Procedure: CARDIOVERSION;  Surgeon: Lelon Perla, MD;  Location: [place];  Service:  Cardiovascular;  Laterality: N/A;   CATARACT EXTRACTION, BILATERAL     CHOLECYSTECTOMY     COLONOSCOPY WITH PROPOFOL N/A 03/05/2015   Procedure: COLONOSCOPY WITH PROPOFOL;  Surgeon: Carol Ada, MD;  Location: [facility] ENDOSCOPY;  Service: Endoscopy;  Laterality: N/A;   DIAGNOSTIC LAPAROSCOPY     ingrown hallux Left    KNEE SURGERY     LEFT HEART CATH AND CORONARY ANGIOGRAPHY N/A 12/31/2017   Procedure: LEFT HEART CATH AND CORONARY ANGIOGRAPHY;  Surgeon: Charolette Forward, MD;  Location: [facility];  Service: Cardiovascular;  Laterality: N/A;   MAZE N/A 12/29/2020   Procedure: MAZE;  Surgeon: [name], MD;  Location: [place];  Service: Open Heart Surgery;  Laterality: N/A;   MITRAL VALVE REPAIR N/A 12/29/2020   Procedure: MITRAL VALVE REPAIR USING CARBOMEDICS ANNULOFLEX RING SIZE 30;  Surgeon: [name], MD;  Location: [place];  Service: Open Heart Surgery;  Laterality: N/A;   RIGHT HEART CATH N/A 12/22/2020   Procedure: RIGHT HEART CATH;  Surgeon: Larey Dresser, MD;  Location: [facility];  Service: Cardiovascular;  Laterality: N/A;   TEE WITHOUT CARDIOVERSION N/A 11/26/2020   Procedure: TRANSESOPHAGEAL ECHOCARDIOGRAM (TEE);  Surgeon: Buford Dresser, MD;  Location: [facility] ENDOSCOPY;  Service: Cardiovascular;  Laterality: N/A;   TEE WITHOUT CARDIOVERSION N/A 12/29/2020   Procedure: TRANSESOPHAGEAL ECHOCARDIOGRAM (TEE);  Surgeon: Roxan Hockey,  Revonda Standard, MD;  Location: [place];  Service: Open Heart Surgery;  Laterality: N/A;   TRICUSPID VALVE REPLACEMENT N/A 12/29/2020   Procedure: 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 RING SIZE 34;  Surgeon: [name], MD;  Location: [place];  Service: Open Heart Surgery;  Laterality: N/A;    Past Medical History:  Diagnosis Date   Allergic rhinitis    Arthritis    Asthma    Chest pain 12/27/2017   Chronic diastolic CHF (congestive heart failure) (HCC)    COPD (chronic obstructive pulmonary disease) (HCC)     Depression    DM (diabetes mellitus) (HCC)    DVT (deep vein thrombosis) in pregnancy    Dysrhythmia    atrial fibrilation   GERD (gastroesophageal reflux disease)    Headache(784.0)    HTN (hypertension)    Hyperlipidemia    Obesity    Pneumonia 04/2018   RIGHT LOBE   Shortness of breath    Sleep apnea    compliant with CPAP    Medications:  Scheduled:  amiodarone  200 mg Oral Daily   apixaban  5 mg Oral BID   cycloSPORINE  1 drop Both Eyes BID   DULoxetine  60 mg Oral Daily   insulin aspart  0-15 Units Subcutaneous TID WC   insulin aspart  5 Units Subcutaneous TID AC   insulin detemir  50 Units Subcutaneous BID   metoprolol succinate  50 mg Oral Daily   multivitamin with minerals  1 tablet Oral Daily   pantoprazole  40 mg Oral BID   potassium chloride  40 mEq Oral Daily   rosuvastatin  10 mg Oral Daily   sodium chloride flush  3 mL Intravenous Q12H   sodium chloride flush  3 mL Intravenous Q12H    Medications Prior to Admission  Medication Sig Dispense Refill   Accu-Chek Softclix Lancets lancets Use as instructed to check blood sugar three times daily. E11.69 (Patient taking differently: 1 each by Other route See admin instructions. Use as instructed to check blood sugar three times daily. E11.69) 100 each 12   albuterol (VENTOLIN HFA) 108 (90 Base) MCG/ACT inhaler Inhale 2 puffs into the lungs every 4 (four) hours as needed for wheezing or shortness of breath.      Alcohol Swabs (B-D SINGLE USE SWABS REGULAR) PADS 1 each by Other route daily.     amiodarone (PACERONE) 200 MG tablet Take 1 tablet (200 mg total) by mouth daily. 30 tablet 6   apixaban (ELIQUIS) 5 MG TABS tablet Take 1 tablet (5 mg total) by mouth 2 (two) times daily. 180 tablet 1   B-D ULTRAFINE III SHORT PEN 31G X 8 MM MISC 1 each by Other route in the morning, at noon, in the evening, and at bedtime.     Blood Glucose Monitoring Suppl (ACCU-CHEK GUIDE ME) w/Device KIT Use as instructed to check blood  sugar three times daily. E11.69 1 kit 0   Blood Glucose Monitoring Suppl (ACCU-CHEK GUIDE) w/Device KIT 1 each by Other route in the morning, at noon, and at bedtime.     DULoxetine (CYMBALTA) 60 MG capsule Take 1 capsule (60 mg total) by mouth daily. 90 capsule 3   EPINEPHrine 0.3 mg/0.3 mL IJ SOAJ injection Inject 0.3 mg into the muscle as needed for anaphylaxis. 1 each 1   furosemide (LASIX) 40 MG tablet Take 40 mg by mouth 2 (two) times daily.     insulin aspart (NOVOLOG) 100 UNIT/ML injection  Inject 12 Units into the skin 3 (three) times daily before meals. For blood sugars 0-199 give 0 units of insulin, 201-250 give 4 units, 251-300 give 6 units, 301-350 give 8 units, 351-400 give 10 units,> 400 give 12 units and call M.D. (Patient taking differently: Inject 5 Units into the skin 3 (three) times daily before meals. For blood sugars 0-199 give 0 units of insulin, 201-250 give 4 units, 251-300 give 6 units, 301-350 give 8 units, 351-400 give 7.) 10 mL 3   LEVEMIR 100 UNIT/ML injection INJECT 0.4 MLS (40 UNITS TOTAL) INTO THE SKIN 2 (TWO) TIMES DAILY. (Patient taking differently: Inject 50 Units into the skin 2 (two) times daily.) 60 mL 1   metolazone (ZAROXOLYN) 5 MG tablet Take 5 mg by mouth daily.     metoprolol succinate (TOPROL-XL) 100 MG 24 hr tablet Take 100 mg by mouth daily.     Multiple Vitamins-Minerals (MULTIVITAMIN WOMEN PO) Take 1 tablet by mouth daily.      nitroGLYCERIN (NITROSTAT) 0.4 MG SL tablet Place 1 tablet (0.4 mg total) under the tongue every 5 (five) minutes x 3 doses as needed for chest pain. 25 tablet 12   oxyCODONE (OXY IR/ROXICODONE) 5 MG immediate release tablet Take 1 tablet (5 mg total) by mouth every 4 (four) hours as needed for severe pain. 30 tablet 0   pantoprazole (PROTONIX) 40 MG tablet Take 40 mg by mouth 2 (two) times daily.     Potassium Chloride ER 20 MEQ TBCR Take 20 mEq by mouth daily. 30 tablet 6   RESTASIS 0.05 % ophthalmic emulsion Place 1 drop into  both eyes 2 (two) times daily.  12   rosuvastatin (CRESTOR) 10 MG tablet Take 1 tablet (10 mg total) by mouth daily. 90 tablet 1   torsemide (DEMADEX) 20 MG tablet Take 4 tablets (80 mg total) by mouth daily. 120 tablet 3   TRUE METRIX BLOOD GLUCOSE TEST test strip USE AS INSTRUCTED TO CHECK BLOOD SUGAR DAILY. (Patient taking differently: 1 each by Other route daily.) 993 strip 11   TRULICITY 1.5 ZJ/6.9CV SOPN INJECT 1.5 MG INTO THE SKIN ONCE A WEEK. 0.5 mL 1    ALLERGIES:   Allergies  Allergen Reactions   Sulfa Antibiotics Itching    FAM HX: Family History  Problem Relation Age of Onset   Cancer Mother    Hypertension Mother    Cancer Father    CAD Other    Hypertension Sister    Hypertension Brother     Social History:   reports that she has never smoked. She has never used smokeless tobacco. She reports that she does not drink alcohol and does not use drugs.  ROS: ROS: unavailable  Blood pressure 117/77, pulse 87, temperature 97.6 F (36.4 C), temperature source Oral, resp. rate 19, height (P) _0  (1.676 m), weight 113.7 kg, SpO2 99 %. PHYSICAL EXAM: Physical Exam:unavailable   Results for orders placed or performed during the hospital encounter of 01/29/21 (from the past 48 hour(s))  Glucose, capillary     Status: Abnormal   Collection Time: 01/29/21  5:26 PM  Result Value Ref Range   Glucose-Capillary 188 (H) 70 - 99 mg/dL    Comment: Glucose reference range applies only to samples taken after fasting for at least 8 hours.  Lactic acid, plasma     Status: None   Collection Time: 01/29/21  5:58 PM  Result Value Ref Range   Lactic Acid, Venous 1.4 0.5 - 1.9  mmol/L    Comment: Performed at [facility], [place] [address], [place], [place] [address]  Glucose, capillary     Status: Abnormal   Collection Time: 01/29/21  8:07 PM  Result Value Ref Range   Glucose-Capillary 274 (H) 70 - 99 mg/dL    Comment: Glucose reference range applies only to samples taken  after fasting for at least 8 hours.  Hemoglobin A1c     Status: Abnormal   Collection Time: 01/29/21  9:46 PM  Result Value Ref Range   Hgb A1c MFr Bld 7.4 (H) 4.8 - 5.6 %    Comment: (NOTE)         Prediabetes: 5.7 - 6.4         Diabetes: >6.4         Glycemic control for adults with diabetes: <7.0    Mean Plasma Glucose 166 mg/dL    Comment: (NOTE) Performed At: [facility] [place], [place] [id number] Rush Farmer MD [id number]   Magnesium     Status: None   Collection Time: 01/29/21  9:46 PM  Result Value Ref Range   Magnesium 1.9 1.7 - 2.4 mg/dL    Comment: Performed at [facility], [place] [address] [place], [place] [address]  Basic metabolic panel     Status: Abnormal   Collection Time: 01/30/21  2:11 AM  Result Value Ref Range   Sodium 134 (L) 135 - 145 mmol/L   Potassium 3.1 (L) 3.5 - 5.1 mmol/L    Comment: NO VISIBLE HEMOLYSIS   Chloride 89 (L) 98 - 111 mmol/L   CO2 36 (H) 22 - 32 mmol/L   Glucose, Bld 171 (H) 70 - 99 mg/dL    Comment: Glucose reference range applies only to samples taken after fasting for at least 8 hours.   BUN 56 (H) 8 - 23 mg/dL   Creatinine, Ser 3.23 (H) 0.44 - 1.00 mg/dL   Calcium 9.9 8.9 - 10.3 mg/dL   GFR, Estimated 15 (L) >60 mL/min    Comment: (NOTE) Calculated using the CKD-EPI Creatinine Equation (2021)    Anion gap 9 5 - 15    Comment: Performed at [facility] [address] [place], [place] [address]  Glucose, capillary     Status: Abnormal   Collection Time: 01/30/21  6:02 AM  Result Value Ref Range   Glucose-Capillary 155 (H) 70 - 99 mg/dL    Comment: Glucose reference range applies only to samples taken after fasting for at least 8 hours.   Comment 1 Notify RN    Comment 2 Document in Chart   Glucose, capillary     Status: Abnormal   Collection Time: 01/30/21 11:22 AM  Result Value Ref Range   Glucose-Capillary 191 (H) 70 - 99 mg/dL    Comment: Glucose reference range applies only  to samples taken after fasting for at least 8 hours.  Potassium     Status: None   Collection Time: 01/30/21  2:43 PM  Result Value Ref Range   Potassium 3.5 3.5 - 5.1 mmol/L    Comment: Performed at [facility], [place] [address], [place], [place] [address]  Magnesium     Status: None   Collection Time: 01/30/21  2:43 PM  Result Value Ref Range   Magnesium 2.0 1.7 - 2.4 mg/dL    Comment: Performed at [facility], [place] [address] [place], [place] [address]  Glucose, capillary  Status: Abnormal   Collection Time: 01/30/21  3:41 PM  Result Value Ref Range   Glucose-Capillary 175 (H) 70 - 99 mg/dL    Comment: Glucose reference range applies only to samples taken after fasting for at least 8 hours.  Glucose, capillary     Status: Abnormal   Collection Time: 01/30/21  9:31 PM  Result Value Ref Range   Glucose-Capillary 168 (H) 70 - 99 mg/dL    Comment: Glucose reference range applies only to samples taken after fasting for at least 8 hours.  Glucose, capillary     Status: None   Collection Time: 01/31/21  6:11 AM  Result Value Ref Range   Glucose-Capillary 74 70 - 99 mg/dL    Comment: Glucose reference range applies only to samples taken after fasting for at least 8 hours.   Comment 1 Notify RN    Comment 2 Document in Chart   CBC with Differential/Platelet     Status: Abnormal   Collection Time: 01/31/21  6:40 AM  Result Value Ref Range   WBC 7.1 4.0 - 10.5 K/uL   RBC 4.24 3.87 - 5.11 MIL/uL   Hemoglobin 11.6 (L) 12.0 - 15.0 g/dL   HCT 36.1 36.0 - 46.0 %   MCV 85.1 80.0 - 100.0 fL   MCH 27.4 26.0 - 34.0 pg   MCHC 32.1 30.0 - 36.0 g/dL   RDW 13.6 11.5 - 15.5 %   Platelets 260 150 - 400 K/uL   nRBC 0.0 0.0 - 0.2 %   Neutrophils Relative % 48 %   Neutro Abs 3.4 1.7 - 7.7 K/uL   Lymphocytes Relative 33 %   Lymphs Abs 2.3 0.7 - 4.0 K/uL   Monocytes Relative 15 %   Monocytes Absolute 1.0 0.1 - 1.0 K/uL   Eosinophils Relative 3 %   Eosinophils Absolute 0.2 0.0 - 0.5  K/uL   Basophils Relative 1 %   Basophils Absolute 0.0 0.0 - 0.1 K/uL   Immature Granulocytes 0 %   Abs Immature Granulocytes 0.02 0.00 - 0.07 K/uL    Comment: Performed at [facility], [address], [place], [place] [address]  Basic metabolic panel     Status: Abnormal   Collection Time: 01/31/21  6:40 AM  Result Value Ref Range   Sodium 136 135 - 145 mmol/L   Potassium 2.9 (L) 3.5 - 5.1 mmol/L    Comment: NO VISIBLE HEMOLYSIS   Chloride 92 (L) 98 - 111 mmol/L   CO2 34 (H) 22 - 32 mmol/L   Glucose, Bld 79 70 - 99 mg/dL    Comment: Glucose reference range applies only to samples taken after fasting for at least 8 hours.   BUN 64 (H) 8 - 23 mg/dL   Creatinine, Ser 3.55 (H) 0.44 - 1.00 mg/dL   Calcium 9.8 8.9 - 10.3 mg/dL   GFR, Estimated 14 (L) >60 mL/min    Comment: (NOTE) Calculated using the CKD-EPI Creatinine Equation (2021)    Anion gap 10 5 - 15    Comment: Performed at [facility] [address], [place], [place] [address]  Glucose, capillary     Status: Abnormal   Collection Time: 01/31/21 11:53 AM  Result Value Ref Range   Glucose-Capillary 165 (H) 70 - 99 mg/dL    Comment: Glucose reference range applies only to samples taken after fasting for at least 8 hours.    CARDIAC CATHETERIZATION  Result Date: 01/31/2021 Findings: RA = 5 RV =  33/2 PA = 35/13 (22) PCW = 13 Fick cardiac output/index = 4.3/2.0 PVR = 2.1 WU FA sat = 97% PA sat = 54%, 54% Assessment: 1. Low filling pressures with moderately reduced CO. Plan/Discussion: Would hold diuretics for now. Hopefully CO will improve with DC-CV. Leslie Bickers, MD 2:48 PM   Assessment/Plan   AKI on CKD IV: Progressive CKD in the setting of previous hemodynamic insults and now possible decompensated CHF.  HF following, getting RHC--> looks like filling pressures are low, diuretics being held.  I am hopeful this will improve things.  Will have to engage in a very frank discussion re: dialytic intervention.  If  HF team determines that inotropes are necessary, that makes her a less than ideal candidate for dialysis (although we have had a few people on dialysis and home inotropes before--> admittedly the prognosis is pretty poor in that setting).  Will follow along. Acute on Chronic systolic CHF: per HF team, diuretics being held Afib: on amiodarone, possible plan for DCCV DM II: per primary Dispo: pending  [name] 01/31/2021, 4:23 PM

## 2021-01-31 NOTE — Progress Notes (Signed)
Progress Note  Patient Name: [name] Date of Encounter: 01/31/2021  [facility] Cardiologist: Buford Dresser, MD   Subjective   Was orthostatic this AM. Tried to use the restroom but became dizzy/lightheaded. Feels fatigued. Making concentrated urine but Cr rising. Discussed options for further evaluation, see below.  Inpatient Medications    Scheduled Meds:  amiodarone  200 mg Oral Daily   apixaban  5 mg Oral BID   cycloSPORINE  1 drop Both Eyes BID   DULoxetine  60 mg Oral Daily   insulin aspart  0-15 Units Subcutaneous TID WC   insulin aspart  5 Units Subcutaneous TID AC   insulin detemir  50 Units Subcutaneous BID   metolazone  5 mg Oral Daily   metoprolol succinate  50 mg Oral Daily   multivitamin with minerals  1 tablet Oral Daily   pantoprazole  40 mg Oral BID   potassium chloride  40 mEq Oral Daily   rosuvastatin  10 mg Oral Daily   sodium chloride flush  3 mL Intravenous Q12H   Continuous Infusions:  sodium chloride     sodium chloride     furosemide 120 mg (01/31/21 0750)   potassium chloride     PRN Meds: sodium chloride, albuterol, nitroGLYCERIN, oxyCODONE, sodium chloride flush   Vital Signs    Vitals:   01/30/21 1541 01/30/21 2127 01/31/21 0500 01/31/21 0530  BP: (!) 103/58 111/63  (!) 115/57  Pulse: 79 70  67  Resp: 18 20  17   Temp: 98.5 F (36.9 C) 97.6 F (36.4 C)  98.5 F (36.9 C)  TempSrc: Oral Oral  Oral  SpO2: 100% 99%  100%  Weight:   113.7 kg   Height:        Intake/Output Summary (Last 24 hours) at 01/31/2021 0945 Last data filed at 01/31/2021 0819 Gross per 24 hour  Intake 466.06 ml  Output 1125 ml  Net -658.94 ml   Last 3 Weights 01/31/2021 01/30/2021 01/29/2021  Weight (lbs) 250 lb 9.6 oz 248 lb 14.4 oz 250 lb  Weight (kg) 113.671 kg 112.9 kg 113.4 kg  Some encounter information is confidential and restricted. Go to Review Flowsheets activity to see all data.      Telemetry    Rate controlled atrial  fibrillation - Personally Reviewed  ECG    01/29/21 afib 105 bpm- Personally Reviewed  Physical Exam   GEN: No acute distress.   Neck: No JVD appreciated but difficult bodhabitus Cardiac: RRR, no murmurs, rubs, or gallops.  Respiratory: Clear to auscultation bilaterally in upper fields, rales in bilateral bases GI: Soft, nontender, non-distended  MS: No pitting edema, legs are soft, warm, well perfused Neuro:  Nonfocal  Psych: Normal affect   Labs    High Sensitivity Troponin:   Recent Labs  Lab 01/29/21 1104 01/29/21 1304  TROPONINIHS 20* 18*      Chemistry Recent Labs  Lab 01/25/21 1116 01/29/21 1104 01/30/21 0211 01/30/21 1443 01/31/21 0640  NA 134* 136 134*  --  136  K 2.8* 2.5* 3.1* 3.5 2.9*  CL 88* 89* 89*  --  92*  CO2 32 34* 36*  --  34*  GLUCOSE 234* 207* 171*  --  79  BUN 63* 57* 56*  --  64*  CREATININE 3.53* 3.58* 3.23*  --  3.55*  CALCIUM 10.2 9.7 9.9  --  9.8  PROT 9.8* 8.6*  --   --   --   ALBUMIN 3.8 3.5  --   --   --  AST 35 39  --   --   --   ALT 29 33  --   --   --   ALKPHOS 121 94  --   --   --   BILITOT 1.0 0.8  --   --   --   GFRNONAA 14* 14* 15*  --  14*  ANIONGAP 14 13 9   --  10     Hematology Recent Labs  Lab 01/25/21 1116 01/29/21 1104 01/31/21 0640  WBC 5.6 6.6 7.1  RBC 4.53 4.25 4.24  HGB 12.2 11.5* 11.6*  HCT 39.0 36.5 36.1  MCV 86.1 85.9 85.1  MCH 26.9 27.1 27.4  MCHC 31.3 31.5 32.1  RDW 13.5 13.6 13.6  PLT 173 239 260    BNP Recent Labs  Lab 01/29/21 1104  BNP 33.6     DDimer No results for input(s): DDIMER in the last 168 hours.   Radiology    DG Chest Portable 1 View  Result Date: 01/29/2021 CLINICAL DATA:  Chest pain.  Recent open heart surgery EXAM: PORTABLE CHEST 1 VIEW COMPARISON:  01/09/2021 FINDINGS: Cardiomegaly. Prior mitral and tricuspid valve repair. Left atrial clipping. There is no edema, consolidation, effusion, or pneumothorax. IMPRESSION: Stable postoperative chest.  No evidence of  active disease. Electronically Signed   By: Monte Fantasia M.D.   On: 01/29/2021 11:32    Cardiac Studies   Echo 01/02/21  1. No subcostal views; Elevated mean gradient across aortic valve (mean  gradient 13 mmHg) but visually valve opens well; S/P MV repair with mean  gradient 7 mmHg and trace MR; S/P TV repair with moderate TR.   2. Left ventricular ejection fraction, by estimation, is 60 to 65%. The  left ventricle has normal function. The left ventricle has no regional  wall motion abnormalities. There is moderate left ventricular hypertrophy.  There is the interventricular  septum is flattened in systole and diastole, consistent with right  ventricular pressure and volume overload.   3. Right ventricular systolic function is normal. The right ventricular  size is mildly enlarged.   4. Left atrial size was moderately dilated.   5. Right atrial size was mildly dilated.   6. The mitral valve has been repaired/replaced. Trivial mitral valve  regurgitation. No evidence of mitral stenosis.   7. The tricuspid valve is has been repaired/replaced. Tricuspid valve  regurgitation is moderate.   8. The aortic valve is tricuspid. Aortic valve regurgitation is not  visualized. No aortic stenosis is present.  Patient Profile     64 y.o. 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 She has had a complicated postoperative course including need for inotropes, acute kidney injury on chronic kidney disease. She presented with shortness of breath and presumed volume overload.  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 With her rising Cr, I am concerned about placing a PICC line. I will arrange for right heart cath this afternoon for evaluation of her filling pressures and cardiac output. -Risks and benefits of cardiac catheterization have been discussed with the patient.  These  include bleeding, infection, kidney damage, stroke, heart attack, death.  The patient understands these risks and is willing to proceed.  Orthostasis: -another concerning feature that has me questioning volume status -decreasing metoprolol this AM  Hypokalemia: -K 2.9 this AM -receiving 40 mEq PO, I will also give 40 meQ IV this morning.  Persistent afib s/p MAZE -continue amiodarone, apixaban -planned for cardioversion as an outpatient -would prefer to do RHC on apixaban  Hyperlipidemia: -continue rosuvastatin for now, cautious given renal function  For questions or updates, please contact [place] Please consult [email or web address] for contact info under        Signed, Buford Dresser, MD  01/31/2021, 9:45 AM

## 2021-01-31 NOTE — Interval H&P Note (Signed)
History and Physical Interval Note:  01/31/2021 1:54 PM  [name]  has presented today for surgery, with the diagnosis of heart failure.  The various methods of treatment have been discussed with the patient and family. After consideration of risks, benefits and other options for treatment, the patient has consented to  Procedure(s): RIGHT HEART CATH (N/A) as a surgical intervention.  The patient's history has been reviewed, patient examined, no change in status, stable for surgery.  I have reviewed the patient's chart and labs.  Questions were answered to the patient's satisfaction.     Bernadette Gores

## 2021-01-31 NOTE — Plan of Care (Signed)
  Problem: Safety: Goal: Ability to remain free from injury will improve Outcome: Progressing   

## 2021-01-31 NOTE — Progress Notes (Signed)
   Remains in A fib.   Orders placed for DC-CV tomorrow if there is a cancellation. NPO after MN.   Jebadiah Imperato NP-C  6:16 PM

## 2021-01-31 NOTE — Progress Notes (Signed)
PROGRESS NOTE    [name]  KGY:[id number] DOB: 08-Apr-1956 DOA: 01/29/2021 PCP: [name], NP   Chief Complain: Chest pain/shortness of breath  Brief Narrative: Patient is a 65 year old female with history of type 2 diabetes, chronic A. fib, chronic kidney disease,, systolic/diastolic positive heart failure, OSA, recent mitral valve surgery on May 11 by Dr. Roxan Hockey secondary to severe mitral regurgitation who presented with 2-day history of chest pain, shortness of breath.  Chest pain was on the right anterior chest wall adjacent to the incision site.  Pain was rated as 8/10.  Also reported orthopnea.  She is on oxygen at 2 L/min via nasal cannula at home as needed.  On presentation she was hemodynamically stable.  Lab work showed severe hypokalemia, mild elevated troponin.  Lab work showed AKI with creatinine of 3.58.  Patient was admitted for the management of acute exacerbation of congestive heart failure.  Cardiology following.  Assessment & Plan:   Principal Problem:   Acute exacerbation of CHF (congestive heart failure) (HCC) Active Problems:   Atrial fibrillation (HCC)   OSA (obstructive sleep apnea)   Chest pain   Diabetes mellitus type 2 in obese (HCC)   Hypokalemia   Acute kidney injury superimposed on CKD (HCC)   Chronic combined systolic and diastolic heart failure (HCC)   S/P mitral valve repair   Acute on chronic systolic/diastolic congestive heart failure: Recently EF of 4 60 to 65%, MR/TR status post recent repair.  Noted to be volume overloaded on presentation.  Presented with chest pain, shortness of breath, orthopnea.  Started on IV Lasix.  Also on metolazone and metopralol Cardiology following, plan for RHC today  AKI on CKD stage IIIb: Baseline creatinine ranges from 1.6-1.8.  Creatinine in the range of 3 on presentation.  Most likely secondary to cardiorenal syndrome.  Uptrending, gfr now less than 15. Uop 1.125 L last 24. Post-op ATN  suspected when hospitalized last month. This may represent new baseline. - will consult nephrology given down-trending kidney function.  Hypokalemia: Being supplemented and monitored. 40 meq oral and 40 meq IV ordered today - repeat PM bmp  History of MVR/TVR: Underwent repair with maze procedure on 5/22 by Dr. Roxan Hockey.  Permanent A. Fib: remains in persistent A. fib.  On amiodarone.  On Eliquis for anticoagulation.  Plan for DCCV later this month.  Digoxin on hold due to AKI.  Hypertension: Currently blood stable.  Continue to monitor.  OSA: On CPAP.  Diabetes type 2: Glucose controlled. Continue current insulin regimen.  Continue home regimen on discharge.  Chest pain: Atypical chest pain, mainly on the incision site.  Continue as needed meds.  No evidence of ischemia.  Generalized weakness: PT/OT consulted  Morbid obesity: BMI 40.            DVT prophylaxis:Eliquis Code Status: Full Family Communication: None at bedside Status is: Inpatient  Remains inpatient appropriate because:IV treatments appropriate due to intensity of illness or inability to take PO  Dispo: The patient is from: Home              Anticipated d/c is to: Home              Patient currently is not medically stable to d/c.   Difficult to place patient No      Consultants: Cardiology  Procedures:None  Antimicrobials:  Anti-infectives (From admission, onward)    None       Subjective:  Patient seen and examined at the bedside this  morning.  Hemodynamically stable.  Reclining in bed. Chest pain stable. Breathing stable, denies new SOB. Has appetite.   Objective: Vitals:   01/30/21 2127 01/31/21 0500 01/31/21 0530 01/31/21 1151  BP: 111/63  (!) 115/57 105/79  Pulse: 70  67 94  Resp: 20  17 18   Temp: 97.6 F (36.4 C)  98.5 F (36.9 C) 97.7 F (36.5 C)  TempSrc: Oral  Oral Oral  SpO2: 99%  100% 100%  Weight:  113.7 kg    Height:        Intake/Output Summary (Last 24  hours) at 01/31/2021 1304 Last data filed at 01/31/2021 4098 Gross per 24 hour  Intake 416.06 ml  Output 675 ml  Net -258.94 ml   Filed Weights   01/29/21 1700 01/30/21 0517 01/31/21 0500  Weight: (P) 113.4 kg 112.9 kg 113.7 kg    Examination:  General exam: Chronically looking, awake, obese HEENT:PERRL,Oral mucosa moist, Ear/Nose normal on gross exam Respiratory system: Bilateral diminished air sounds on the bases, no wheezes or crackles  cardiovascular system: Irregularly irregular rhythm , surgical scar on the chest gastrointestinal system: Abdomen is nondistended, soft and nontender. No organomegaly or masses felt. Normal bowel sounds heard. Central nervous system: Alert and oriented. No focal neurological deficits. Extremities: 2+ bilateral lower extremity edema, no clubbing ,no cyanosis Skin: No rashes, lesions or ulcers,no icterus ,no pallor    Data Reviewed: I have personally reviewed following labs and imaging studies  CBC: Recent Labs  Lab 01/25/21 1116 01/29/21 1104 01/31/21 0640  WBC 5.6 6.6 7.1  NEUTROABS  --  3.6 3.4  HGB 12.2 11.5* 11.6*  HCT 39.0 36.5 36.1  MCV 86.1 85.9 85.1  PLT 173 239 119   Basic Metabolic Panel: Recent Labs  Lab 01/25/21 1116 01/29/21 1104 01/29/21 2146 01/30/21 0211 01/30/21 1443 01/31/21 0640  NA 134* 136  --  134*  --  136  K 2.8* 2.5*  --  3.1* 3.5 2.9*  CL 88* 89*  --  89*  --  92*  CO2 32 34*  --  36*  --  34*  GLUCOSE 234* 207*  --  171*  --  79  BUN 63* 57*  --  56*  --  64*  CREATININE 3.53* 3.58*  --  3.23*  --  3.55*  CALCIUM 10.2 9.7  --  9.9  --  9.8  MG  --   --  1.9  --  2.0  --    GFR: Estimated Creatinine Clearance: 20.8 mL/min (A) (by C-G formula based on SCr of 3.55 mg/dL (H)). Liver Function Tests: Recent Labs  Lab 01/25/21 1116 01/29/21 1104  AST 35 39  ALT 29 33  ALKPHOS 121 94  BILITOT 1.0 0.8  PROT 9.8* 8.6*  ALBUMIN 3.8 3.5   No results for input(s): LIPASE, AMYLASE in the last 168  hours. No results for input(s): AMMONIA in the last 168 hours. Coagulation Profile: No results for input(s): INR, PROTIME in the last 168 hours. Cardiac Enzymes: No results for input(s): CKTOTAL, CKMB, CKMBINDEX, TROPONINI in the last 168 hours. BNP (last 3 results) No results for input(s): PROBNP in the last 8760 hours. HbA1C: Recent Labs    01/29/21 2146  HGBA1C 7.4*   CBG: Recent Labs  Lab 01/30/21 1122 01/30/21 1541 01/30/21 2131 01/31/21 0611 01/31/21 1153  GLUCAP 191* 175* 168* 74 165*   Lipid Profile: No results for input(s): CHOL, HDL, LDLCALC, TRIG, CHOLHDL, LDLDIRECT in the last 72 hours. Thyroid  Function Tests: No results for input(s): TSH, T4TOTAL, FREET4, T3FREE, THYROIDAB in the last 72 hours. Anemia Panel: No results for input(s): VITAMINB12, FOLATE, FERRITIN, TIBC, IRON, RETICCTPCT in the last 72 hours. Sepsis Labs: Recent Labs  Lab 01/29/21 1504 01/29/21 1758  LATICACIDVEN 1.3 1.4    Recent Results (from the past 240 hour(s))  Resp Panel by RT-PCR (Flu A&B, Covid) Nasopharyngeal Swab     Status: None   Collection Time: 01/29/21 11:12 AM   Specimen: Nasopharyngeal Swab; Nasopharyngeal(NP) swabs in vial transport medium  Result Value Ref Range Status   SARS Coronavirus 2 by RT PCR NEGATIVE NEGATIVE Final    Comment: (NOTE) SARS-CoV-2 target nucleic acids are NOT DETECTED.  The SARS-CoV-2 RNA is generally detectable in upper respiratory specimens during the acute phase of infection. The lowest concentration of SARS-CoV-2 viral copies this assay can detect is 138 copies/mL. A negative result does not preclude SARS-Cov-2 infection and should not be used as the sole basis for treatment or other patient management decisions. A negative result may occur with  improper specimen collection/handling, submission of specimen other than nasopharyngeal swab, presence of viral mutation(s) within the areas targeted by this assay, and inadequate number of  viral copies(<138 copies/mL). A negative result must be combined with clinical observations, patient history, and epidemiological information. The expected result is Negative.  Fact Sheet for Patients:  EntrepreneurPulse.com.au  Fact Sheet for Healthcare Providers:  IncredibleEmployment.be  This test is no t yet approved or cleared by the [place] FDA and  has been authorized for detection and/or diagnosis of SARS-CoV-2 by FDA under an Emergency Use Authorization (EUA). This EUA will remain  in effect (meaning this test can be used) for the duration of the COVID-19 declaration under Section 564(b)(1) of the Act, 21 U.S.C.section 360bbb-3(b)(1), unless the authorization is terminated  or revoked sooner.       Influenza A by PCR NEGATIVE NEGATIVE Final   Influenza B by PCR NEGATIVE NEGATIVE Final    Comment: (NOTE) The Xpert Xpress SARS-CoV-2/FLU/RSV plus assay is intended as an aid in the diagnosis of influenza from Nasopharyngeal swab specimens and should not be used as a sole basis for treatment. Nasal washings and aspirates are unacceptable for Xpert Xpress SARS-CoV-2/FLU/RSV testing.  Fact Sheet for Patients: EntrepreneurPulse.com.au  Fact Sheet for Healthcare Providers: IncredibleEmployment.be  This test is not yet approved or cleared by the [place] FDA and has been authorized for detection and/or diagnosis of SARS-CoV-2 by FDA under an Emergency Use Authorization (EUA). This EUA will remain in effect (meaning this test can be used) for the duration of the COVID-19 declaration under Section 564(b)(1) of the Act, 21 U.S.C. section 360bbb-3(b)(1), unless the authorization is terminated or revoked.  Performed at [facility], [place] [address] [place], [place] [address]          Radiology Studies: No results found.      Scheduled Meds:  amiodarone  200 mg Oral Daily    apixaban  5 mg Oral BID   cycloSPORINE  1 drop Both Eyes BID   DULoxetine  60 mg Oral Daily   insulin aspart  0-15 Units Subcutaneous TID WC   insulin aspart  5 Units Subcutaneous TID AC   insulin detemir  50 Units Subcutaneous BID   metolazone  5 mg Oral Daily   metoprolol succinate  50 mg Oral Daily   multivitamin with minerals  1 tablet Oral Daily   pantoprazole  40 mg Oral BID  potassium chloride  40 mEq Oral Daily   rosuvastatin  10 mg Oral Daily   sodium chloride flush  3 mL Intravenous Q12H   sodium chloride flush  3 mL Intravenous Q12H   Continuous Infusions:  sodium chloride     sodium chloride     sodium chloride 10 mL/hr at 01/31/21 1112   furosemide 120 mg (01/31/21 0750)   potassium chloride 10 mEq (01/31/21 1227)     LOS: 2 days    Time spent: 35 mins.More than 50% of that time was spent in counseling and/or coordination of care.      Desma Maxim, MD [facility] P6/13/2022, 1:04 PM

## 2021-02-01 ENCOUNTER — Inpatient Hospital Stay (HOSPITAL_COMMUNITY): Payer: Medicare Other | Admitting: Registered Nurse

## 2021-02-01 ENCOUNTER — Encounter (HOSPITAL_COMMUNITY): Payer: Self-pay | Admitting: Internal Medicine

## 2021-02-01 ENCOUNTER — Encounter (HOSPITAL_COMMUNITY)
Admission: EM | Disposition: A | Discharge status: Home Health | Payer: Self-pay | Source: Home / Self Care | Attending: Internal Medicine

## 2021-02-01 ENCOUNTER — Ambulatory Visit: Payer: Self-pay | Admitting: Thoracic Surgery (Cardiothoracic Vascular Surgery)

## 2021-02-01 HISTORY — PX: CARDIOVERSION: SHX1299

## 2021-02-01 LAB — BASIC METABOLIC PANEL
Anion gap: 10 (ref 5–15)
BUN: 59 mg/dL — ABNORMAL HIGH (ref 8–23)
CO2: 32 mmol/L (ref 22–32)
Calcium: 9.6 mg/dL (ref 8.9–10.3)
Chloride: 93 mmol/L — ABNORMAL LOW (ref 98–111)
Creatinine, Ser: 3.15 mg/dL — ABNORMAL HIGH (ref 0.44–1.00)
GFR, Estimated: 16 mL/min — ABNORMAL LOW (ref >60)
Glucose, Bld: 110 mg/dL — ABNORMAL HIGH (ref 70–99)
Potassium: 2.9 mmol/L — ABNORMAL LOW (ref 3.5–5.1)
Sodium: 135 mmol/L (ref 135–145)

## 2021-02-01 LAB — PROTIME-INR
INR: 2 — ABNORMAL HIGH (ref 0.8–1.2)
Prothrombin Time: 22.6 seconds — ABNORMAL HIGH (ref 11.4–15.2)

## 2021-02-01 LAB — GLUCOSE, CAPILLARY
Glucose-Capillary: 111 mg/dL — ABNORMAL HIGH (ref 70–99)
Glucose-Capillary: 100 mg/dL — ABNORMAL HIGH (ref 70–99)
Glucose-Capillary: 184 mg/dL — ABNORMAL HIGH (ref 70–99)
Glucose-Capillary: 146 mg/dL — ABNORMAL HIGH (ref 70–99)

## 2021-02-01 LAB — MAGNESIUM: Magnesium: 2 mg/dL (ref 1.7–2.4)

## 2021-02-01 SURGERY — CARDIOVERSION
Anesthesia: General

## 2021-02-01 MED ORDER — SODIUM CHLORIDE 0.9 % IV SOLN: INTRAVENOUS | Status: DC | PRN

## 2021-02-01 MED ORDER — AMIODARONE HCL 200 MG PO TABS
200.0000 mg | ORAL_TABLET | Freq: Every day | ORAL | Status: DC
  Administered 2021-02-01 – 2021-02-02 (×2): 200 mg via ORAL
  Filled 2021-02-01: qty 1

## 2021-02-01 MED ORDER — METOPROLOL SUCCINATE ER 50 MG PO TB24
50.0000 mg | ORAL_TABLET | Freq: Every day | ORAL | Status: DC
  Administered 2021-02-01 – 2021-02-02 (×2): 50 mg via ORAL
  Filled 2021-02-01: qty 1

## 2021-02-01 MED ORDER — ROSUVASTATIN CALCIUM 5 MG PO TABS
10.0000 mg | ORAL_TABLET | Freq: Every day | ORAL | Status: DC
  Administered 2021-02-01 – 2021-02-02 (×2): 10 mg via ORAL
  Filled 2021-02-01: qty 2

## 2021-02-01 MED ORDER — ADULT MULTIVITAMIN W/MINERALS CH
1.0000 | ORAL_TABLET | Freq: Every day | ORAL | Status: DC
  Administered 2021-02-01 – 2021-02-02 (×2): 1 via ORAL
  Filled 2021-02-01: qty 1

## 2021-02-01 MED ORDER — POTASSIUM CHLORIDE CRYS ER 20 MEQ PO TBCR
40.0000 meq | EXTENDED_RELEASE_TABLET | Freq: Two times a day (BID) | ORAL | Status: DC
  Administered 2021-02-01 – 2021-02-02 (×3): 40 meq via ORAL
  Filled 2021-02-01 (×3): qty 2

## 2021-02-01 MED ORDER — LIDOCAINE 2% (20 MG/ML) 5 ML SYRINGE
INTRAMUSCULAR | Status: DC | PRN
  Administered 2021-02-01: 60 mg via INTRAVENOUS

## 2021-02-01 MED ORDER — PROPOFOL 10 MG/ML IV BOLUS
INTRAVENOUS | Status: DC | PRN
  Administered 2021-02-01: 60 mg via INTRAVENOUS

## 2021-02-01 MED ORDER — DULOXETINE HCL 60 MG PO CPEP
60.0000 mg | ORAL_CAPSULE | Freq: Every day | ORAL | Status: DC
  Administered 2021-02-01 – 2021-02-02 (×2): 60 mg via ORAL
  Filled 2021-02-01: qty 1

## 2021-02-01 NOTE — Progress Notes (Signed)
OT Cancellation Note  Patient Details Name: [name] MRN: [id number] DOB: [birth year]   Cancelled Treatment:    Reason Eval/Treat Not Completed: Patient declined, no reason specified- pt declined OT at this time, reports still feeling nauseated and "just waiting for my procedure".  Will follow and see as able.   Jolaine Artist, OT Acute Rehabilitation Services Pager [phone] Office [phone]    Delight Stare 02/01/2021, 10:25 AM

## 2021-02-01 NOTE — H&P (View-Only) (Signed)
Advanced Heart Failure Rounding Note  PCP-Cardiologist: Buford Dresser, MD   Subjective:   RHC 6/13 with low filling pressures. Diuretics held   Creatinine trending down 3.5>3.1  Feeling ok. Denies SOB.    Objective:   Weight Range: 113.6 kg Body mass index is 40.42 kg/m (pended).   Vital Signs:   Temp:  [97.6 F (36.4 C)-98.4 F (36.9 C)] 97.6 F (36.4 C) (06/14 0446) Pulse Rate:  [75-94] 82 (06/14 0446) Resp:  [18-21] 18 (06/14 0446) BP: (105-137)/(60-84) 114/61 (06/14 0446) SpO2:  [94 %-100 %] 100 % (06/14 0446) Weight:  [113.6 kg] 113.6 kg (06/14 0446) Last BM Date: 01/31/21  Weight change: Filed Weights   01/30/21 0517 01/31/21 0500 02/01/21 0446  Weight: 112.9 kg 113.7 kg 113.6 kg    Intake/Output:   Intake/Output Summary (Last 24 hours) at 02/01/2021 0816 Last data filed at 01/31/2021 2100 Gross per 24 hour  Intake 1000 ml  Output 1450 ml  Net -450 ml      Physical Exam    General:  IN bed.  No resp difficulty HEENT: Normal Neck: Supple. JVP 5-6  Carotids 2+ bilat; no bruits. No lymphadenopathy or thyromegaly appreciated. Cor: PMI nondisplaced. irregular rate & rhythm. No rubs, gallops or murmurs. Lungs: Clear Abdomen: Soft, nontender, nondistended. No hepatosplenomegaly. No bruits or masses. Good bowel sounds. Extremities: No cyanosis, clubbing, rash, edema Neuro: Alert & orientedx3, cranial nerves grossly intact. moves all 4 extremities w/o difficulty. Affect pleasant   Telemetry  A fib 80s   EKG    N/a  Labs    CBC Recent Labs    01/29/21 1104 01/31/21 0640 01/31/21 1409  WBC 6.6 7.1  --   NEUTROABS 3.6 3.4  --   HGB 11.5* 11.6* 12.6  12.2  HCT 36.5 36.1 37.0  36.0  MCV 85.9 85.1  --   PLT 239 260  --    Basic Metabolic Panel Recent Labs    01/30/21 1443 01/31/21 0640 01/31/21 1452 02/01/21 0211  NA  --    < > 136 135  K 3.5   < > 3.6 2.9*  CL  --    < > 92* 93*  CO2  --    < > 29 32  GLUCOSE  --    < >  92 110*  BUN  --    < > 61* 59*  CREATININE  --    < > 3.24* 3.15*  CALCIUM  --    < > 10.0 9.6  MG 2.0  --   --  2.0   < > = values in this interval not displayed.   Liver Function Tests Recent Labs    01/29/21 1104  AST 39  ALT 33  ALKPHOS 94  BILITOT 0.8  PROT 8.6*  ALBUMIN 3.5   No results for input(s): LIPASE, AMYLASE in the last 72 hours. Cardiac Enzymes No results for input(s): CKTOTAL, CKMB, CKMBINDEX, TROPONINI in the last 72 hours.  BNP: BNP (last 3 results) Recent Labs    11/22/20 1527 12/19/20 1016 01/29/21 1104  BNP 132.8* 227.9* 33.6    ProBNP (last 3 results) No results for input(s): PROBNP in the last 8760 hours.   D-Dimer No results for input(s): DDIMER in the last 72 hours. Hemoglobin A1C Recent Labs    01/29/21 2146  HGBA1C 7.4*   Fasting Lipid Panel No results for input(s): CHOL, HDL, LDLCALC, TRIG, CHOLHDL, LDLDIRECT in the last 72 hours. Thyroid Function Tests No results  for input(s): TSH, T4TOTAL, T3FREE, THYROIDAB in the last 72 hours.  Invalid input(s): FREET3  Other results:   Imaging    CARDIAC CATHETERIZATION  Result Date: 01/31/2021 Findings: RA = 5 RV = 33/2 PA = 35/13 (22) PCW = 13 Fick cardiac output/index = 4.3/2.0 PVR = 2.1 WU FA sat = 97% PA sat = 54%, 54% Assessment: 1. Low filling pressures with moderately reduced CO. Plan/Discussion: Would hold diuretics for now. Hopefully CO will improve with DC-CV. Glori Bickers, MD 2:48 PM    Medications:     Scheduled Medications:  amiodarone  200 mg Oral Daily   apixaban  5 mg Oral BID   cycloSPORINE  1 drop Both Eyes BID   DULoxetine  60 mg Oral Daily   insulin aspart  0-15 Units Subcutaneous TID WC   insulin aspart  5 Units Subcutaneous TID AC   insulin detemir  50 Units Subcutaneous BID   metoprolol succinate  50 mg Oral Daily   multivitamin with minerals  1 tablet Oral Daily   pantoprazole  40 mg Oral BID   potassium chloride  40 mEq Oral Daily    rosuvastatin  10 mg Oral Daily   sodium chloride flush  3 mL Intravenous Q12H   sodium chloride flush  3 mL Intravenous Q12H    Infusions:  sodium chloride     sodium chloride      PRN Medications: sodium chloride, sodium chloride, acetaminophen, albuterol, nitroGLYCERIN, ondansetron (ZOFRAN) IV, oxyCODONE, sodium chloride flush, sodium chloride flush     Assessment/Plan  A/C Systolic Heart Failure -TEE 4/22 EF 45-50% RV moderately HK - Echo LVEF 55% RV moderately HK. Moderate TR. No significant MR. No effusion. - Diuresed on admit. Creatinine has been elevated. -6/13 RHC today with low filling pressures. Diuretics stopped. - Volume status stable.  Hold diuretics again today.  No SGLT2i with GFR < 20 - No spiro/ari with creatinine 3.5 - Follow BMET   2. A fib -Rate controlled. Plan DC-CV today if we can get a spot. NPO  - Continue amio 200 mg daily - Continue eliquis 5 mg twice a day   3. AKI -Creatinine on admit 3.5. -Had RHC today with low filling pressures. Diuretics held. - Creatinine trending down 3.5>>3.1    4. Valvular Heart Disease S/P MVR/ TVR -Severe TR s/p annuloplasty. Severe MR s/p annuloplasty on 12/30/20 - Echo 01/02/21 LVEF 55% RV moderately HK. Moderate TR. No significant MR. No effusion - Will let CT surgery know   5. Obesity  Body mass index is 40.42 kg/m (pended).    6. DMII On SSI   7. Hypokalemia  Supp K     Length of Stay: 3  Amy Clegg, NP  02/01/2021, 8:16 AM  Advanced Heart Failure Team Pager [phone] (M-F; 7a - 5p)  Please contact [facility] for night-coverage after hours (5p -7a ) and weekends on [email or web address]  Patient seen and examined with the above-signed Advanced Practice Provider and/or Housestaff. I personally reviewed laboratory data, imaging studies and relevant notes. I independently examined the patient and formulated the important aspects of the plan. I have edited the note to reflect any of my changes or salient  points. I have personally discussed the plan with the patient and/or family.   Feeling ok. Denies SOB, orthopnea or PND. Diuretics on hold. Creatinine improving.  Still in AF. Eliquis Resumed yesterday  General:  Sitting up in bed. No resp difficulty HEENT: normal Neck: supple. no JVD. Carotids 2+ bilat;  no bruits. No lymphadenopathy or thryomegaly appreciated. Cor: PMI nondisplaced. Irregular rate & rhythm. No rubs, gallops or murmurs. Lungs: clear Abdomen: obese, soft, nontender, nondistended. No hepatosplenomegaly. No bruits or masses. Good bowel sounds. Extremities: no cyanosis, clubbing, rash, edema Neuro: alert & orientedx3, cranial nerves grossly intact. moves all 4 extremities w/o difficulty. Affect pleasant  RHC results reviewed with her. Volume status low. Output marginal but acceptable - suspect will improved some as filling pressures increase. Will hold diuretics for a few days. Renal function imrpoving and hopefully will continue to do so though she may be nearing time to think about HD access. (Will not be able to come off [facility] for at least 4 weeks after DC-CV).   Plan DC-CV later today.   Glori Bickers, MD  9:41 AM

## 2021-02-01 NOTE — Anesthesia Procedure Notes (Signed)
Date/Time: 02/01/2021 12:43 PM Performed by: [name]., CRNA Pre-anesthesia Checklist: Patient identified, Emergency Drugs available, Suction available, Patient being monitored and Timeout performed Patient Re-evaluated:Patient Re-evaluated prior to induction Oxygen Delivery Method: Ambu bag Preoxygenation: Pre-oxygenation with 100% oxygen Induction Type: IV induction Placement Confirmation: positive ETCO2

## 2021-02-01 NOTE — Transfer of Care (Signed)
Immediate Anesthesia Transfer of Care Note  Patient: [name]  Procedure(s) Performed: CARDIOVERSION  Patient Location: PACU and Endoscopy Unit  Anesthesia Type:General  Level of Consciousness: drowsy  Airway & Oxygen Therapy: Patient Spontanous Breathing  Post-op Assessment: Report given to RN and Post -op Vital signs reviewed and stable  Post vital signs: Reviewed and stable  Last Vitals:  Vitals Value Taken Time  BP    Temp    Pulse    Resp    SpO2      Last Pain:  Vitals:   02/01/21 1220  TempSrc: Temporal  PainSc:          Complications: No notable events documented.

## 2021-02-01 NOTE — Plan of Care (Signed)
  Problem: Education: Goal: Knowledge of General Education information will improve Description Including pain rating scale, medication(s)/side effects and non-pharmacologic comfort measures Outcome: Progressing   Problem: Health Behavior/Discharge Planning: Goal: Ability to manage health-related needs will improve Outcome: Progressing   Problem: Activity: Goal: Risk for activity intolerance will decrease Outcome: Progressing   Problem: Pain Managment: Goal: General experience of comfort will improve Outcome: Progressing   

## 2021-02-01 NOTE — Interval H&P Note (Signed)
History and Physical Interval Note:  02/01/2021 12:48 PM  [name]  has presented today for surgery, with the diagnosis of afib.  The various methods of treatment have been discussed with the patient and family. After consideration of risks, benefits and other options for treatment, the patient has consented to  Procedure(s): CARDIOVERSION (N/A) as a surgical intervention.  The patient's history has been reviewed, patient examined, no change in status, stable for surgery.  I have reviewed the patient's chart and labs.  Questions were answered to the patient's satisfaction.     Lary Eckardt

## 2021-02-01 NOTE — Progress Notes (Signed)
Patient stated she didn't want to wear CPAP tonight.  Patient in no distress at this time. RT will continue to monitor.

## 2021-02-01 NOTE — Progress Notes (Addendum)
Advanced Heart Failure Rounding Note  PCP-Cardiologist: Buford Dresser, MD   Subjective:   RHC 6/13 with low filling pressures. Diuretics held   Creatinine trending down 3.5>3.1  Feeling ok. Denies SOB.    Objective:   Weight Range: 113.6 kg Body mass index is 40.42 kg/m (pended).   Vital Signs:   Temp:  [97.6 F (36.4 C)-98.4 F (36.9 C)] 97.6 F (36.4 C) (06/14 0446) Pulse Rate:  [75-94] 82 (06/14 0446) Resp:  [18-21] 18 (06/14 0446) BP: (105-137)/(60-84) 114/61 (06/14 0446) SpO2:  [94 %-100 %] 100 % (06/14 0446) Weight:  [113.6 kg] 113.6 kg (06/14 0446) Last BM Date: 01/31/21  Weight change: Filed Weights   01/30/21 0517 01/31/21 0500 02/01/21 0446  Weight: 112.9 kg 113.7 kg 113.6 kg    Intake/Output:   Intake/Output Summary (Last 24 hours) at 02/01/2021 0816 Last data filed at 01/31/2021 2100 Gross per 24 hour  Intake 1000 ml  Output 1450 ml  Net -450 ml      Physical Exam    General:  IN bed.  No resp difficulty HEENT: Normal Neck: Supple. JVP 5-6  Carotids 2+ bilat; no bruits. No lymphadenopathy or thyromegaly appreciated. Cor: PMI nondisplaced. irregular rate & rhythm. No rubs, gallops or murmurs. Lungs: Clear Abdomen: Soft, nontender, nondistended. No hepatosplenomegaly. No bruits or masses. Good bowel sounds. Extremities: No cyanosis, clubbing, rash, edema Neuro: Alert & orientedx3, cranial nerves grossly intact. moves all 4 extremities w/o difficulty. Affect pleasant   Telemetry  A fib 80s   EKG    N/a  Labs    CBC Recent Labs    01/29/21 1104 01/31/21 0640 01/31/21 1409  WBC 6.6 7.1  --   NEUTROABS 3.6 3.4  --   HGB 11.5* 11.6* 12.6  12.2  HCT 36.5 36.1 37.0  36.0  MCV 85.9 85.1  --   PLT 239 260  --    Basic Metabolic Panel Recent Labs    01/30/21 1443 01/31/21 0640 01/31/21 1452 02/01/21 0211  NA  --    < > 136 135  K 3.5   < > 3.6 2.9*  CL  --    < > 92* 93*  CO2  --    < > 29 32  GLUCOSE  --    < >  92 110*  BUN  --    < > 61* 59*  CREATININE  --    < > 3.24* 3.15*  CALCIUM  --    < > 10.0 9.6  MG 2.0  --   --  2.0   < > = values in this interval not displayed.   Liver Function Tests Recent Labs    01/29/21 1104  AST 39  ALT 33  ALKPHOS 94  BILITOT 0.8  PROT 8.6*  ALBUMIN 3.5   No results for input(s): LIPASE, AMYLASE in the last 72 hours. Cardiac Enzymes No results for input(s): CKTOTAL, CKMB, CKMBINDEX, TROPONINI in the last 72 hours.  BNP: BNP (last 3 results) Recent Labs    11/22/20 1527 12/19/20 1016 01/29/21 1104  BNP 132.8* 227.9* 33.6    ProBNP (last 3 results) No results for input(s): PROBNP in the last 8760 hours.   D-Dimer No results for input(s): DDIMER in the last 72 hours. Hemoglobin A1C Recent Labs    01/29/21 2146  HGBA1C 7.4*   Fasting Lipid Panel No results for input(s): CHOL, HDL, LDLCALC, TRIG, CHOLHDL, LDLDIRECT in the last 72 hours. Thyroid Function Tests No results  for input(s): TSH, T4TOTAL, T3FREE, THYROIDAB in the last 72 hours.  Invalid input(s): FREET3  Other results:   Imaging    CARDIAC CATHETERIZATION  Result Date: 01/31/2021 Findings: RA = 5 RV = 33/2 PA = 35/13 (22) PCW = 13 Fick cardiac output/index = 4.3/2.0 PVR = 2.1 WU FA sat = 97% PA sat = 54%, 54% Assessment: 1. Low filling pressures with moderately reduced CO. Plan/Discussion: Would hold diuretics for now. Hopefully CO will improve with DC-CV. Glori Bickers, MD 2:48 PM    Medications:     Scheduled Medications:  amiodarone  200 mg Oral Daily   apixaban  5 mg Oral BID   cycloSPORINE  1 drop Both Eyes BID   DULoxetine  60 mg Oral Daily   insulin aspart  0-15 Units Subcutaneous TID WC   insulin aspart  5 Units Subcutaneous TID AC   insulin detemir  50 Units Subcutaneous BID   metoprolol succinate  50 mg Oral Daily   multivitamin with minerals  1 tablet Oral Daily   pantoprazole  40 mg Oral BID   potassium chloride  40 mEq Oral Daily    rosuvastatin  10 mg Oral Daily   sodium chloride flush  3 mL Intravenous Q12H   sodium chloride flush  3 mL Intravenous Q12H    Infusions:  sodium chloride     sodium chloride      PRN Medications: sodium chloride, sodium chloride, acetaminophen, albuterol, nitroGLYCERIN, ondansetron (ZOFRAN) IV, oxyCODONE, sodium chloride flush, sodium chloride flush     Assessment/Plan  A/C Systolic Heart Failure -TEE 4/22 EF 45-50% RV moderately HK - Echo LVEF 55% RV moderately HK. Moderate TR. No significant MR. No effusion. - Diuresed on admit. Creatinine has been elevated. -6/13 RHC today with low filling pressures. Diuretics stopped. - Volume status stable.  Hold diuretics again today.  No SGLT2i with GFR < 20 - No spiro/ari with creatinine 3.5 - Follow BMET   2. A fib -Rate controlled. Plan DC-CV today if we can get a spot. NPO  - Continue amio 200 mg daily - Continue eliquis 5 mg twice a day   3. AKI -Creatinine on admit 3.5. -Had RHC today with low filling pressures. Diuretics held. - Creatinine trending down 3.5>>3.1    4. Valvular Heart Disease S/P MVR/ TVR -Severe TR s/p annuloplasty. Severe MR s/p annuloplasty on 12/30/20 - Echo 01/02/21 LVEF 55% RV moderately HK. Moderate TR. No significant MR. No effusion - Will let CT surgery know   5. Obesity  Body mass index is 40.42 kg/m (pended).    6. DMII On SSI   7. Hypokalemia  Supp K     Length of Stay: 3  Amy Clegg, NP  02/01/2021, 8:16 AM  Advanced Heart Failure Team Pager [phone] (M-F; 7a - 5p)  Please contact [facility] for night-coverage after hours (5p -7a ) and weekends on [email or web address]  Patient seen and examined with the above-signed Advanced Practice Provider and/or Housestaff. I personally reviewed laboratory data, imaging studies and relevant notes. I independently examined the patient and formulated the important aspects of the plan. I have edited the note to reflect any of my changes or salient  points. I have personally discussed the plan with the patient and/or family.   Feeling ok. Denies SOB, orthopnea or PND. Diuretics on hold. Creatinine improving.  Still in AF. Eliquis Resumed yesterday  General:  Sitting up in bed. No resp difficulty HEENT: normal Neck: supple. no JVD. Carotids 2+ bilat;  no bruits. No lymphadenopathy or thryomegaly appreciated. Cor: PMI nondisplaced. Irregular rate & rhythm. No rubs, gallops or murmurs. Lungs: clear Abdomen: obese, soft, nontender, nondistended. No hepatosplenomegaly. No bruits or masses. Good bowel sounds. Extremities: no cyanosis, clubbing, rash, edema Neuro: alert & orientedx3, cranial nerves grossly intact. moves all 4 extremities w/o difficulty. Affect pleasant  RHC results reviewed with her. Volume status low. Output marginal but acceptable - suspect will improved some as filling pressures increase. Will hold diuretics for a few days. Renal function imrpoving and hopefully will continue to do so though she may be nearing time to think about HD access. (Will not be able to come off [facility] for at least 4 weeks after DC-CV).   Plan DC-CV later today.   Glori Bickers, MD  9:41 AM

## 2021-02-01 NOTE — CV Procedure (Signed)
    DIRECT CURRENT CARDIOVERSION  NAME:  [name]   MRN: [id number] DOB:  Dec 16, 1955   ADMIT DATE: 01/29/2021   INDICATIONS: Atrial fibrillation    PROCEDURE:   Informed consent was obtained prior to the procedure. The risks, benefits and alternatives for the procedure were discussed and the patient comprehended these risks. Once an appropriate time out was taken, the patient had the defibrillator pads placed in the anterior and posterior position. The patient then underwent sedation by the anesthesia service. Once an appropriate level of sedation was achieved, the patient received a single biphasic, synchronized 200J shock with prompt conversion to sinus rhythm. No apparent complications.  Glori Bickers, MD  12:57 PM

## 2021-02-01 NOTE — Progress Notes (Signed)
RT spoke with patient about wearing CPAP.  Patient stated she doesn't want to wear at this time. RT will continue to monitor.

## 2021-02-01 NOTE — Anesthesia Postprocedure Evaluation (Signed)
Anesthesia Post Note  Patient: [name]  Procedure(s) Performed: CARDIOVERSION     Patient location during evaluation: Endoscopy Anesthesia Type: General Level of consciousness: awake and sedated Pain management: pain level controlled Vital Signs Assessment: post-procedure vital signs reviewed and stable Respiratory status: spontaneous breathing Cardiovascular status: stable Postop Assessment: no apparent nausea or vomiting Anesthetic complications: no   No notable events documented.  Last Vitals:  Vitals:   02/01/21 1258 02/01/21 1310  BP: 103/60 126/68  Pulse: 77 78  Resp: (!) 24 (!) 21  Temp: (!) 36.3 C   SpO2: 100% 100%    Last Pain:  Vitals:   02/01/21 1310  TempSrc:   PainSc: 0-No pain                 Huston Foley

## 2021-02-01 NOTE — TOC Initial Note (Addendum)
Transition of Care (TOC) - Initial/Assessment Note  Heart Failure   Patient Details  Name: Tomeka Kantner MRN: [id number] Date of Birth: 05/22/1956  [facility]) CM/SW Contact:    [place], [place] Phone Number: 02/01/2021, 11:29 AM  Clinical Narrative:      CSW spoke with the patient at bedside to check on her and she reported no new updates or social needs with SDOH and reported no needs at this time.  CSW will continue to follow throughout discharge.       Barriers to Discharge: Continued Medical Work up   Patient Goals and CMS Choice        Expected Discharge Plan and Services   In-house Referral: Clinical Social Work     Living arrangements for the past 2 months: Apartment                                      Prior Living Arrangements/Services Living arrangements for the past 2 months: Apartment Lives with:: Self, Adult Children Patient language and need for interpreter reviewed:: Yes        Need for Family Participation in Patient Care: No (Comment) Care giver support system in place?: No (comment)   Criminal Activity/Legal Involvement Pertinent to Current Situation/Hospitalization: No - Comment as needed  Activities of Daily Living Home Assistive Devices/Equipment: Bedside commode/3-in-1 ADL Screening (condition at time of admission) Patient's cognitive ability adequate to safely complete daily activities?: Yes Is the patient deaf or have difficulty hearing?: No Does the patient have difficulty seeing, even when wearing glasses/contacts?: No Does the patient have difficulty concentrating, remembering, or making decisions?: No Patient able to express need for assistance with ADLs?: Yes Does the patient have difficulty dressing or bathing?: No Independently performs ADLs?: Yes (appropriate for developmental age) Does the patient have difficulty walking or climbing stairs?: No Weakness of Legs: None Weakness of Arms/Hands:  None  Permission Sought/Granted                  Emotional Assessment Appearance:: Appears stated age Attitude/Demeanor/Rapport: Engaged Affect (typically observed): Pleasant Orientation: : Oriented to Self, Oriented to Place, Oriented to  Time, Oriented to Situation   Psych Involvement: No (comment)  Admission diagnosis:  Acute exacerbation of CHF (congestive heart failure) ([place]) [I50.9] Patient Active Problem List   Diagnosis Date Noted   Acute exacerbation of CHF (congestive heart failure) ([place]) 01/29/2021   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   Encounter for preoperative dental examination    Teeth missing    Gingivitis    Accretions on teeth    CHF exacerbation ([place]) 12/21/2020   Acute on chronic heart failure ([place]) 12/19/2020   Atrial fibrillation, permanent ([place]) 11/02/2020   Chronic combined systolic and diastolic heart failure ([place]) 05/06/2020   NICM (nonischemic cardiomyopathy) ([place]) 04/01/2020   Family history of heart disease 04/01/2020   Mitral regurgitation 04/01/2020   Severe tricuspid regurgitation 04/01/2020   Closed right ankle fracture 09/24/2019   DKA, type 2 ([place]) 09/24/2019   Margaret (hyperglycemic hyperosmolar nonketotic coma) ([place]) 02/21/2019   Hypokalemia 02/21/2019   Acute kidney injury superimposed on CKD ([place]) 02/21/2019   Acute on chronic left systolic heart failure ([place]) 07/01/2018   Congestive heart failure with left ventricular diastolic dysfunction, acute ([place]) 07/01/2018   Right upper lobe pneumonia 05/14/2018   Atrial fibrillation with RVR ([place]) 05/14/2018   Chronic atrial fibrillation ([place]) 12/26/2017  Diastolic dysfunction 44/31/5400   Diabetes mellitus type 2 in obese ([place]) 12/26/2017   Nausea vomiting and diarrhea    Chest pain 02/26/2012   OSA (obstructive sleep apnea) 09/21/2011   Hypoxia 09/21/2011   Diabetes mellitus ([place]) 02/04/2010   Morbid obesity ([place]) 02/04/2010   Atrial fibrillation ([place]) 02/04/2010   Gastroparesis  02/04/2010   Acute gastroenteritis 02/04/2010   PCP:  [name], NP Pharmacy:   [id number] [place] [address] [place] [address] Phone: [phone] Fax: [phone]  [facility] [address] [place] [address] Phone: [phone] Fax: [phone]     Social Determinants of Health ([place]) Interventions completed 1 month ago    Readmission Risk Interventions Readmission Risk Prevention Plan 01/11/2021 03/08/2020  Transportation Screening Complete Complete  Medication Review Press photographer) - Complete  PCP or Specialist appointment within 3-5 days of discharge Complete Complete  HRI or [place] Complete Complete  SW Recovery Care/Counseling Consult Complete Complete  Palliative Care Screening Not Applicable Not [place] Not Applicable Not Applicable  Some recent data might be hidden   Tobin Cadiente, MSW, LCSWA [phone] Heart Failure Social Worker

## 2021-02-01 NOTE — Progress Notes (Signed)
  [facility] Progress Note   Assessment/ Plan:   AKI on CKD IV: Progressive CKD in the setting of previous hemodynamic insults and now possible decompensated CHF.  HF following, getting RHC--> looks like filling pressures are low, diuretics being held.  I am hopeful this will improve things and they have. - no indication for dialysis at this time - still holding diuretics per HF team - renal function is improving - will follow along and help arrange OP followup  Acute on Chronic systolic CHF: per HF team, diuretics being held Afib: on amiodarone, plan for DCCV today DM II: per primary Dispo: pending--> from renal standpoint no barriers to d/c  Subjective:    Seen in room.  For cardioversion today.  Feeling better.  Cr down to 3.15.  K still low-repleted.   Objective:   BP 126/68   Pulse 78   Temp (!) 97.3 F (36.3 C) (Temporal)   Resp (!) 21   Ht (P) 5\' 6"  (1.676 m)   Wt 113.6 kg   SpO2 100%   BMI (P) 40.42 kg/m   Physical Exam: Gen: NAD, sitting in chair CVS: irregular Resp: clear Abd: soft Ext: no LE edema  Labs: BMET Recent Labs  Lab 01/29/21 1104 01/30/21 0211 01/30/21 1443 01/31/21 0640 01/31/21 1409 01/31/21 1452 02/01/21 0211  NA 136 134*  --  136 139  139 136 135  K 2.5* 3.1* 3.5 2.9* 3.3*  3.2* 3.6 2.9*  CL 89* 89*  --  92*  --  92* 93*  CO2 34* 36*  --  34*  --  29 32  GLUCOSE 207* 171*  --  79  --  92 110*  BUN 57* 56*  --  64*  --  61* 59*  CREATININE 3.58* 3.23*  --  3.55*  --  3.24* 3.15*  CALCIUM 9.7 9.9  --  9.8  --  10.0 9.6   CBC Recent Labs  Lab 01/29/21 1104 01/31/21 0640 01/31/21 1409  WBC 6.6 7.1  --   NEUTROABS 3.6 3.4  --   HGB 11.5* 11.6* 12.6  12.2  HCT 36.5 36.1 37.0  36.0  MCV 85.9 85.1  --   PLT 239 260  --       Medications:     [MAR Hold] amiodarone  200 mg Oral Daily   [MAR Hold] apixaban  5 mg Oral BID   [MAR Hold] cycloSPORINE  1 drop Both Eyes BID   [MAR Hold] DULoxetine  60 mg Oral  Daily   [MAR Hold] insulin aspart  0-15 Units Subcutaneous TID WC   [MAR Hold] insulin aspart  5 Units Subcutaneous TID AC   [MAR Hold] insulin detemir  50 Units Subcutaneous BID   [MAR Hold] metoprolol succinate  50 mg Oral Daily   [MAR Hold] multivitamin with minerals  1 tablet Oral Daily   [MAR Hold] pantoprazole  40 mg Oral BID   [MAR Hold] potassium chloride  40 mEq Oral BID   [MAR Hold] rosuvastatin  10 mg Oral Daily   [MAR Hold] sodium chloride flush  3 mL Intravenous Q12H   [MAR Hold] sodium chloride flush  3 mL Intravenous Q12H     Madelon Lips, MD 02/01/2021, 1:13 PM

## 2021-02-01 NOTE — Progress Notes (Signed)
PROGRESS NOTE    Edona Schreffler  ERD:[id number] DOB: 06/28/1956 DOA: 01/29/2021 PCP: [name], NP   Chief Complain: Chest pain/shortness of breath  Brief Narrative: Patient is a 65 year old female with history of type 2 diabetes, chronic A. fib, chronic kidney disease,, systolic/diastolic positive heart failure, OSA, recent mitral valve surgery on May 11 by Dr. Roxan Hockey secondary to severe mitral regurgitation who presented with 2-day history of chest pain, shortness of breath.  Chest pain was on the right anterior chest wall adjacent to the incision site.  Pain was rated as 8/10.  Also reported orthopnea.  She is on oxygen at 2 L/min via nasal cannula at home as needed.  On presentation she was hemodynamically stable.  Lab work showed severe hypokalemia, mild elevated troponin.  Lab work showed AKI with creatinine of 3.58.  Patient was admitted for the management of acute exacerbation of congestive heart failure.  Cardiology following.    Assessment & Plan:   Principal Problem:   Acute exacerbation of CHF (congestive heart failure) (HCC) Active Problems:   Atrial fibrillation (HCC)   OSA (obstructive sleep apnea)   Chest pain   Diabetes mellitus type 2 in obese (HCC)   Hypokalemia   Acute kidney injury superimposed on CKD (HCC)   Chronic combined systolic and diastolic heart failure (HCC)   S/P mitral valve repair   Acute on chronic systolic/diastolic congestive heart failure Recently EF of 60 to 65%, MR/TR status post recent repair Noted to be volume overloaded on presentation  Cardiology/HF on board, RHC done showed low filling pressures, rec to hold all diuretics, also DCCV  AKI on CKD stage IIIb Baseline creatinine ranges from 1.6-1.8, on presentation, 3  Nephrology consulted, appreciate recs Daily BMP  Hypokalemia Replace prn  History of MVR/TVR Underwent repair with maze procedure on 5/22 by Dr. Roxan Hockey  Permanent A. Fib Remains in  persistent A. fib.  On amiodarone.  On Eliquis for anticoagulation Digoxin on hold due to AKI. S/P DCCV with successful conversion to NSR   Hypertension Currently blood stable.  Continue to monitor.  OSA On CPAP.  Diabetes type 2 SSI, accuchecks, hypoglycemic control  Atypical chest pain Mainly on the incision site.  Continue as needed meds.  No evidence of ischemia.  Generalized weakness PT/OT consulted  Morbid obesity BMI 40   DVT prophylaxis:Eliquis Code Status: Full Family Communication: None at bedside Status is: Inpatient  Remains inpatient appropriate because:IV treatments appropriate due to intensity of illness or inability to take PO  Dispo: The patient is from: Home              Anticipated d/c is to: Home              Patient currently is not medically stable to d/c.   Difficult to place patient No      Consultants: Cardiology/HF  Procedures: DCCV  Antimicrobials:  Anti-infectives (From admission, onward)    None       Subjective:  Pt seen and examined prior to DCCV, denied any new complaints   Objective: Vitals:   02/01/21 1400 02/01/21 1509 02/01/21 1516 02/01/21 1600  BP: 128/66 123/63 (!) 129/59 131/67  Pulse:   80   Resp: 14 20 19 14   Temp:   (!) 97.4 F (36.3 C)   TempSrc:   Oral   SpO2: 100% 100% 100% 99%  Weight:      Height:        Intake/Output Summary (Last 24 hours) at 02/01/2021  [place] filed at 02/01/2021 1541 Gross per 24 hour  Intake 340 ml  Output 1501 ml  Net -1161 ml   Filed Weights   01/30/21 0517 01/31/21 0500 02/01/21 0446  Weight: 112.9 kg 113.7 kg 113.6 kg    Examination: General: NAD, obese Cardiovascular: S1, S2 present Respiratory: CTAB, surgical scar noted on chest wall Abdomen: Soft, nontender, nondistended, bowel sounds present Musculoskeletal: 2+ bilateral pedal edema noted Skin: Normal Psychiatry: Fair mood      Data Reviewed: I have personally reviewed following labs and  imaging studies  CBC: Recent Labs  Lab 01/29/21 1104 01/31/21 0640 01/31/21 1409  WBC 6.6 7.1  --   NEUTROABS 3.6 3.4  --   HGB 11.5* 11.6* 12.6  12.2  HCT 36.5 36.1 37.0  36.0  MCV 85.9 85.1  --   PLT 239 260  --    Basic Metabolic Panel: Recent Labs  Lab 01/29/21 1104 01/29/21 2146 01/30/21 0211 01/30/21 1443 01/31/21 0640 01/31/21 1409 01/31/21 1452 02/01/21 0211  NA 136  --  134*  --  136 139  139 136 135  K 2.5*  --  3.1* 3.5 2.9* 3.3*  3.2* 3.6 2.9*  CL 89*  --  89*  --  92*  --  92* 93*  CO2 34*  --  36*  --  34*  --  29 32  GLUCOSE 207*  --  171*  --  79  --  92 110*  BUN 57*  --  56*  --  64*  --  61* 59*  CREATININE 3.58*  --  3.23*  --  3.55*  --  3.24* 3.15*  CALCIUM 9.7  --  9.9  --  9.8  --  10.0 9.6  MG  --  1.9  --  2.0  --   --   --  2.0   GFR: Estimated Creatinine Clearance: 23.5 mL/min (A) (by C-G formula based on SCr of 3.15 mg/dL (H)). Liver Function Tests: Recent Labs  Lab 01/29/21 1104  AST 39  ALT 33  ALKPHOS 94  BILITOT 0.8  PROT 8.6*  ALBUMIN 3.5   No results for input(s): LIPASE, AMYLASE in the last 168 hours. No results for input(s): AMMONIA in the last 168 hours. Coagulation Profile: Recent Labs  Lab 02/01/21 0211  INR 2.0*   Cardiac Enzymes: No results for input(s): CKTOTAL, CKMB, CKMBINDEX, TROPONINI in the last 168 hours. BNP (last 3 results) No results for input(s): PROBNP in the last 8760 hours. HbA1C: Recent Labs    01/29/21 2146  HGBA1C 7.4*   CBG: Recent Labs  Lab 01/31/21 1633 01/31/21 2019 02/01/21 0550 02/01/21 1042 02/01/21 1517  GLUCAP 165* 221* 111* 100* 184*   Lipid Profile: No results for input(s): CHOL, HDL, LDLCALC, TRIG, CHOLHDL, LDLDIRECT in the last 72 hours. Thyroid Function Tests: No results for input(s): TSH, T4TOTAL, FREET4, T3FREE, THYROIDAB in the last 72 hours. Anemia Panel: No results for input(s): VITAMINB12, FOLATE, FERRITIN, TIBC, IRON, RETICCTPCT in the last 72  hours. Sepsis Labs: Recent Labs  Lab 01/29/21 1504 01/29/21 1758  LATICACIDVEN 1.3 1.4    Recent Results (from the past 240 hour(s))  Resp Panel by RT-PCR (Flu A&B, Covid) Nasopharyngeal Swab     Status: None   Collection Time: 01/29/21 11:12 AM   Specimen: Nasopharyngeal Swab; Nasopharyngeal(NP) swabs in vial transport medium  Result Value Ref Range Status   SARS Coronavirus 2 by RT PCR NEGATIVE NEGATIVE Final  Comment: (NOTE) SARS-CoV-2 target nucleic acids are NOT DETECTED.  The SARS-CoV-2 RNA is generally detectable in upper respiratory specimens during the acute phase of infection. The lowest concentration of SARS-CoV-2 viral copies this assay can detect is 138 copies/mL. A negative result does not preclude SARS-Cov-2 infection and should not be used as the sole basis for treatment or other patient management decisions. A negative result may occur with  improper specimen collection/handling, submission of specimen other than nasopharyngeal swab, presence of viral mutation(s) within the areas targeted by this assay, and inadequate number of viral copies(<138 copies/mL). A negative result must be combined with clinical observations, patient history, and epidemiological information. The expected result is Negative.  Fact Sheet for Patients:  EntrepreneurPulse.com.au  Fact Sheet for Healthcare Providers:  IncredibleEmployment.be  This test is no t yet approved or cleared by the [place] FDA and  has been authorized for detection and/or diagnosis of SARS-CoV-2 by FDA under an Emergency Use Authorization (EUA). This EUA will remain  in effect (meaning this test can be used) for the duration of the COVID-19 declaration under Section 564(b)(1) of the Act, 21 U.S.C.section 360bbb-3(b)(1), unless the authorization is terminated  or revoked sooner.       Influenza A by PCR NEGATIVE NEGATIVE Final   Influenza B by PCR NEGATIVE  NEGATIVE Final    Comment: (NOTE) The Xpert Xpress SARS-CoV-2/FLU/RSV plus assay is intended as an aid in the diagnosis of influenza from Nasopharyngeal swab specimens and should not be used as a sole basis for treatment. Nasal washings and aspirates are unacceptable for Xpert Xpress SARS-CoV-2/FLU/RSV testing.  Fact Sheet for Patients: EntrepreneurPulse.com.au  Fact Sheet for Healthcare Providers: IncredibleEmployment.be  This test is not yet approved or cleared by the [place] FDA and has been authorized for detection and/or diagnosis of SARS-CoV-2 by FDA under an Emergency Use Authorization (EUA). This EUA will remain in effect (meaning this test can be used) for the duration of the COVID-19 declaration under Section 564(b)(1) of the Act, 21 U.S.C. section 360bbb-3(b)(1), unless the authorization is terminated or revoked.  Performed at [facility], [place], [place], [place] [address]          Radiology Studies: CARDIAC CATHETERIZATION  Result Date: 01/31/2021 Findings: RA = 5 RV = 33/2 PA = 35/13 (22) PCW = 13 Fick cardiac output/index = 4.3/2.0 PVR = 2.1 WU FA sat = 97% PA sat = 54%, 54% Assessment: 1. Low filling pressures with moderately reduced CO. Plan/Discussion: Would hold diuretics for now. Hopefully CO will improve with DC-CV. Glori Bickers, MD 2:48 PM       Scheduled Meds:  amiodarone  200 mg Oral Daily   apixaban  5 mg Oral BID   cycloSPORINE  1 drop Both Eyes BID   DULoxetine  60 mg Oral Daily   insulin aspart  0-15 Units Subcutaneous TID WC   insulin aspart  5 Units Subcutaneous TID AC   insulin detemir  50 Units Subcutaneous BID   metoprolol succinate  50 mg Oral Daily   multivitamin with minerals  1 tablet Oral Daily   pantoprazole  40 mg Oral BID   potassium chloride  40 mEq Oral BID   rosuvastatin  10 mg Oral Daily   sodium chloride flush  3 mL Intravenous Q12H   sodium chloride flush  3  mL Intravenous Q12H   Continuous Infusions:  sodium chloride     sodium chloride       LOS: 3  days     Alma Friendly, MD [facility] 02/01/2021, 6:26 PM

## 2021-02-01 NOTE — Progress Notes (Signed)
Physical Therapy Treatment Patient Details Name: [name] MRN: [id number] DOB: Aug 19, 1956 Today's Date: 02/01/2021    History of Present Illness [name] is a 65 y.o. female who presented for a 2+ day history of chest pain and SOB. Admitted with acute on chronic CHF and A-fib. Plan for cardioversion 02/01/21 tentatively. PMH significant of type 2 diabetes, chronic A-fib, CKD, combined systolic and diastolic heart failure, OSA, recent MVR May 11 by Dr. Roxan Hockey secondary to severe mitral regurg.    PT Comments    Patient not progressing well with mobility due to dizziness and nausea worsened with change in position and standing. Requires Min guard for safety to stand due to symptoms. BP more stable today however pt still symptomatic. See below. Able to tolerate transfer to chair taking a few steps but no further ambulation due to feeling like she might pass out. Encouraged OOB to chair as much as tolerated and using bathroom with assist if feeling better.  Supine BP 102/66, 69 bpm Sitting BP 111/65, 79 bpm Standing BP 115/87, 104 bpm Sitting BP post transfer 133/78, 116 bpm  Plan for cardioversion today. Will continue to follow and progress as tolerated.   Follow Up Recommendations  Home health PT;Other (comment) (pending progress once nausea/dizziness improve)     Equipment Recommendations  None recommended by PT    Recommendations for Other Services       Precautions / Restrictions Precautions Precautions: Fall Precaution Comments: Dizziness with standing Restrictions Weight Bearing Restrictions: No    Mobility  Bed Mobility Overal bed mobility: Modified Independent Bed Mobility: Supine to Sit;Sit to Supine     Supine to sit: Modified independent (Device/Increase time);HOB elevated Sit to supine: Modified independent (Device/Increase time);HOB elevated   General bed mobility comments: Able to perform x2 with use of rail on both sides of bed. +  dizziness first time but not second time.    Transfers Overall transfer level: Needs assistance Equipment used: Rolling walker (2 wheeled) Transfers: Sit to/from Stand Sit to Stand: Min guard         General transfer comment: min guard due to dizziness; stood from EOB x2. Encouraged ankle mobility (heel/toe raises) in standing. + dizziness.  Ambulation/Gait Ambulation/Gait assistance: Min guard Gait Distance (Feet): 5 Feet Assistive device: Rolling walker (2 wheeled) Gait Pattern/deviations: Step-through pattern;Decreased stride length Gait velocity: decreased   General Gait Details: Able to take a few steps to get to chair with use of RW; mild dizziness.   Stairs             Wheelchair Mobility    Modified Rankin (Stroke Patients Only)       Balance Overall balance assessment: Needs assistance Sitting-balance support: No upper extremity supported;Feet supported Sitting balance-Leahy Scale: Good Sitting balance - Comments: Leaning on RW due to symptoms of dizziness/nausea initially   Standing balance support: During functional activity Standing balance-Leahy Scale: Poor Standing balance comment: Requires UE support in standing and close Min guard due to symptoms.                            Cognition Arousal/Alertness: Awake/alert Behavior During Therapy: WFL for tasks assessed/performed Overall Cognitive Status: Within Functional Limits for tasks assessed                                        Exercises  General Comments General comments (skin integrity, edema, etc.): Nausea/dizziness reported with standing, resolves once seated/supine. Supine BP 102/66, 69 bpm, Sitting BP 111/65, 79 bpm; Standing BP 115/87, 104 bpm, Sitting BP post transfer 133/78, 116 bpm.      Pertinent Vitals/Pain Pain Assessment: Faces Faces Pain Scale: Hurts little more Pain Location: head, hands/feet Pain Descriptors / Indicators:  Headache;Dull;Aching Pain Intervention(s): Monitored during session;Repositioned    Home Living                      Prior Function            PT Goals (current goals can now be found in the care plan section) Progress towards PT goals: Progressing toward goals    Frequency    Min 3X/week      PT Plan Current plan remains appropriate    Co-evaluation              AM-PAC PT "6 Clicks" Mobility   Outcome Measure  Help needed turning from your back to your side while in a flat bed without using bedrails?: None Help needed moving from lying on your back to sitting on the side of a flat bed without using bedrails?: None Help needed moving to and from a bed to a chair (including a wheelchair)?: A Little Help needed standing up from a chair using your arms (e.g., wheelchair or bedside chair)?: A Little Help needed to walk in hospital room?: A Little Help needed climbing 3-5 steps with a railing? : A Lot 6 Click Score: 19    End of Session Equipment Utilized During Treatment: Gait belt Activity Tolerance: Treatment limited secondary to medical complications (Comment) (dizziness, nausea) Patient left: in chair;with call bell/phone within reach Nurse Communication: Mobility status PT Visit Diagnosis: Other abnormalities of gait and mobility (R26.89);Muscle weakness (generalized) (M62.81);Pain Pain - Right/Left:  (bil) Pain - part of body: Ankle and joints of foot;Hand (head)     Time: 4403-4742 PT Time Calculation (min) (ACUTE ONLY): 26 min  Charges:  $Therapeutic Activity: 23-37 mins                     Marisa Severin, PT, DPT Acute Rehabilitation Services Pager [phone] Office [phone]      Marguarite Arbour A Sabra Heck 02/01/2021, 9:36 AM

## 2021-02-01 NOTE — Anesthesia Preprocedure Evaluation (Signed)
Anesthesia Evaluation  Patient identified by MRN, date of birth, ID band Patient awake    Reviewed: Allergy & Precautions, NPO status , Patient's Chart, lab work & pertinent test results  Airway Mallampati: III  TM Distance: >3 FB Neck ROM: Full    Dental  (+) Dental Advisory Given   Pulmonary shortness of breath, asthma , sleep apnea , COPD,    breath sounds clear to auscultation       Cardiovascular hypertension, Pt. on medications and Pt. on home beta blockers +CHF  + dysrhythmias Atrial Fibrillation + Valvular Problems/Murmurs MR  Rhythm:Irregular Rate:Normal     Neuro/Psych    GI/Hepatic Neg liver ROS, GERD  Medicated,  Endo/Other  diabetes, Type 2, Insulin DependentMorbid obesity  Renal/GU CRFRenal disease  negative genitourinary   Musculoskeletal  (+) Arthritis ,   Abdominal (+) + obese,   Peds  Hematology negative hematology ROS (+)   Anesthesia Other Findings   Reproductive/Obstetrics                             Lab Results  Component Value Date   WBC 7.1 01/31/2021   HGB 12.2 01/31/2021   HGB 12.6 01/31/2021   HCT 36.0 01/31/2021   HCT 37.0 01/31/2021   MCV 85.1 01/31/2021   PLT 260 01/31/2021   Lab Results  Component Value Date   CREATININE 3.15 (H) 02/01/2021   BUN 59 (H) 02/01/2021   NA 135 02/01/2021   K 2.9 (L) 02/01/2021   CL 93 (L) 02/01/2021   CO2 32 02/01/2021    Anesthesia Physical  Anesthesia Plan  ASA: 3  Anesthesia Plan: General   Post-op Pain Management:    Induction:   PONV Risk Score and Plan: 3 and Treatment may vary due to age or medical condition, Propofol infusion and TIVA  Airway Management Planned:   Additional Equipment:   Intra-op Plan:   Post-operative Plan: Post-operative intubation/ventilation  Informed Consent: I have reviewed the patients History and Physical, chart, labs and discussed the procedure including the risks,  benefits and alternatives for the proposed anesthesia with the patient or authorized representative who has indicated his/her understanding and acceptance.       Plan Discussed with: CRNA  Anesthesia Plan Comments:         Anesthesia Quick Evaluation

## 2021-02-02 ENCOUNTER — Telehealth (INDEPENDENT_AMBULATORY_CARE_PROVIDER_SITE_OTHER): Payer: Self-pay | Admitting: Primary Care

## 2021-02-02 DIAGNOSIS — G4733 Obstructive sleep apnea (adult) (pediatric): Secondary | ICD-10-CM

## 2021-02-02 DIAGNOSIS — E1169 Type 2 diabetes mellitus with other specified complication: Secondary | ICD-10-CM

## 2021-02-02 DIAGNOSIS — E669 Obesity, unspecified: Secondary | ICD-10-CM

## 2021-02-02 LAB — CBC WITH DIFFERENTIAL/PLATELET
Abs Immature Granulocytes: 0.02 10*3/uL (ref 0.00–0.07)
Basophils Absolute: 0 10*3/uL (ref 0.0–0.1)
Basophils Relative: 1 %
Eosinophils Absolute: 0.1 10*3/uL (ref 0.0–0.5)
Eosinophils Relative: 2 %
HCT: 34.6 % — ABNORMAL LOW (ref 36.0–46.0)
Hemoglobin: 10.8 g/dL — ABNORMAL LOW (ref 12.0–15.0)
Immature Granulocytes: 0 %
Lymphocytes Relative: 33 %
Lymphs Abs: 2.4 10*3/uL (ref 0.7–4.0)
MCH: 27.5 pg (ref 26.0–34.0)
MCHC: 31.2 g/dL (ref 30.0–36.0)
MCV: 88 fL (ref 80.0–100.0)
Monocytes Absolute: 1.1 10*3/uL — ABNORMAL HIGH (ref 0.1–1.0)
Monocytes Relative: 15 %
Neutro Abs: 3.5 10*3/uL (ref 1.7–7.7)
Neutrophils Relative %: 49 %
Platelets: 245 10*3/uL (ref 150–400)
RBC: 3.93 MIL/uL (ref 3.87–5.11)
RDW: 14.3 % (ref 11.5–15.5)
WBC: 7.2 10*3/uL (ref 4.0–10.5)
nRBC: 0 % (ref 0.0–0.2)

## 2021-02-02 LAB — BASIC METABOLIC PANEL
Anion gap: 12 (ref 5–15)
BUN: 54 mg/dL — ABNORMAL HIGH (ref 8–23)
CO2: 27 mmol/L (ref 22–32)
Calcium: 9.6 mg/dL (ref 8.9–10.3)
Chloride: 100 mmol/L (ref 98–111)
Creatinine, Ser: 2.7 mg/dL — ABNORMAL HIGH (ref 0.44–1.00)
GFR, Estimated: 19 mL/min — ABNORMAL LOW (ref >60)
Glucose, Bld: 86 mg/dL (ref 70–99)
Potassium: 3.6 mmol/L (ref 3.5–5.1)
Sodium: 139 mmol/L (ref 135–145)

## 2021-02-02 LAB — GLUCOSE, CAPILLARY
Glucose-Capillary: 81 mg/dL (ref 70–99)
Glucose-Capillary: 118 mg/dL — ABNORMAL HIGH (ref 70–99)
Glucose-Capillary: 148 mg/dL — ABNORMAL HIGH (ref 70–99)

## 2021-02-02 LAB — MAGNESIUM: Magnesium: 2 mg/dL (ref 1.7–2.4)

## 2021-02-02 MED ORDER — METOPROLOL SUCCINATE ER 25 MG PO TB24: 25.0000 mg | ORAL_TABLET | Freq: Every day | ORAL | 0 refills | Status: DC

## 2021-02-02 MED ORDER — METOPROLOL SUCCINATE ER 25 MG PO TB24: 25.0000 mg | ORAL_TABLET | Freq: Every day | ORAL | Status: DC

## 2021-02-02 MED ORDER — TORSEMIDE 20 MG PO TABS: 40.0000 mg | ORAL_TABLET | Freq: Every day | ORAL | 3 refills | Status: DC

## 2021-02-02 NOTE — Progress Notes (Signed)
Occupational Therapy Treatment Patient Details Name: [name] MRN: [id number] DOB: 18-Jan-1956 Today's Date: 02/02/2021    History of present illness [name] is a 65 y.o. female who presented for a 2+ day history of chest pain and SOB. Admitted with acute on chronic CHF and A-fib. S/P cardioversion 02/01/21. PMH significant of type 2 diabetes, chronic A-fib, CKD, combined systolic and diastolic heart failure, OSA, recent MVR May 11 by Dr. Roxan Hockey secondary to severe mitral regurg.   OT comments  Patient supine in bed and agreeable to OT, eager to complete self care.  Completing transfers with min guard using RW for safety/balance, limited mobility due to lightheaded/dizzy with standing and notable BP drop (see below). Assisted to sink via recliner and engaged in grooming with setup, UB bathing/dressing with setup, LB bathing with mod assist.  She fatigues easily but highly motivated.  Will follow acutely.  supine 108/74 (83) seated 114/68 (81) standing 87/66 (73) seated 116/73 (87) 122/73 post session seated recliner   HR 86-89, SpO2 100% RA   Follow Up Recommendations  Home health OT;Supervision/Assistance - 24 hour    Equipment Recommendations  None recommended by OT    Recommendations for Other Services      Precautions / Restrictions Precautions Precautions: Fall Precaution Comments: Dizziness with standing, watch BP Restrictions Weight Bearing Restrictions: No       Mobility Bed Mobility Overal bed mobility: Modified Independent             General bed mobility comments: HOB elevated but no assist required    Transfers Overall transfer level: Needs assistance Equipment used: Rolling walker (2 wheeled) Transfers: Sit to/from Stand Sit to Stand: Min guard Stand pivot transfers: Min guard       General transfer comment: min guard for safety/balance due to lightheadedness    Balance Overall balance assessment: Needs  assistance Sitting-balance support: No upper extremity supported;Feet supported Sitting balance-Leahy Scale: Good     Standing balance support: Bilateral upper extremity supported;No upper extremity supported;During functional activity Standing balance-Leahy Scale: Poor Standing balance comment: requires UE support dynamically, min guard to close supervision during ADLS                           ADL either performed or assessed with clinical judgement   ADL Overall ADL's : Needs assistance/impaired     Grooming: Set up;Sitting Grooming Details (indicate cue type and reason): at sink Upper Body Bathing: Set up;Sitting   Lower Body Bathing: Sit to/from stand;Moderate assistance Lower Body Bathing Details (indicate cue type and reason): requires assist for buttocks, LES Upper Body Dressing : Set up;Sitting       Toilet Transfer: Min Insurance claims handler Details (indicate cue type and reason): simulated to recliner         Functional mobility during ADLs: Min guard;Rolling walker;Cueing for safety General ADL Comments: pt limited by impaired activity tolerance, generalized weakness     Vision       Perception     Praxis      Cognition Arousal/Alertness: Awake/alert Behavior During Therapy: WFL for tasks assessed/performed Overall Cognitive Status: Impaired/Different from baseline Area of Impairment: Safety/judgement;Problem solving                         Safety/Judgement: Decreased awareness of safety   Problem Solving: Slow processing;Requires verbal cues General Comments: pt with decreased safety awareness, reports lightheaded in standing with BP drop  noted but asks to walk to sink. Educated on safety and risk with hypotension.        Exercises     Shoulder Instructions       General Comments lightheadedness in standing, see clinical impression for vitals    Pertinent Vitals/ Pain       Pain Assessment: No/denies  pain  Home Living                                          Prior Functioning/Environment              Frequency  Min 2X/week        Progress Toward Goals  OT Goals(current goals can now be found in the care plan section)  Progress towards OT goals: Progressing toward goals  Acute Rehab OT Goals Patient Stated Goal: to get stronger OT Goal Formulation: With patient  Plan Discharge plan remains appropriate;Frequency remains appropriate    Co-evaluation                 AM-PAC OT "6 Clicks" Daily Activity     Outcome Measure   Help from another person eating meals?: None Help from another person taking care of personal grooming?: A Little Help from another person toileting, which includes using toliet, bedpan, or urinal?: A Lot Help from another person bathing (including washing, rinsing, drying)?: A Little Help from another person to put on and taking off regular upper body clothing?: A Little Help from another person to put on and taking off regular lower body clothing?: A Lot 6 Click Score: 17    End of Session Equipment Utilized During Treatment: Rolling walker  OT Visit Diagnosis: Unsteadiness on feet (R26.81);History of falling (Z91.81);Dizziness and giddiness (R42)   Activity Tolerance Patient tolerated treatment well   Patient Left in chair;with call bell/phone within reach   Nurse Communication Mobility status        Time: 0923-1010 OT Time Calculation (min): 47 min  Charges: OT General Charges $OT Visit: 1 Visit OT Treatments $Self Care/Home Management : 38-52 mins  [place] Pager [phone] Office [phone]    Delight Stare 02/02/2021, 10:21 AM

## 2021-02-02 NOTE — Telephone Encounter (Signed)
Caller stated part of the fax sent was cut off, Please re-fax the orders for the shoes to [phone].

## 2021-02-02 NOTE — TOC Progression Note (Signed)
Transition of Care (TOC) - Progression Note  Heart Failure   Patient Details  Name: Nazariah Cadet MRN: [id number] Date of Birth: [birth year]  Transition of Care [facility]) CM/SW [place], [place] Phone Number: 02/02/2021, 5:01 PM  Clinical Narrative:    CSW spoke with the patient at bedside to follow up per cardiology stating patient having difficulty coping and needing to see the CSW. Patient was on the phone with family as the CSW came to talk with [name] and showed the CSW the damage done to the apartment when they came to spray for bugs including her couch turned upside down and items everywhere around her house. Upon the patient ending the phone call and shared that she has lived at her apartment complex for 13 years and she is fed up and would like to find another living situation preferably a 4 bedroom house or apartment. CSW informed [name] that housing in [place] is an issue as there just isn't many options for affordable housing available. CSW obtained the patients permission to consult the [facility] outpatient social worker for support and patient agreed.   CSW will continue to follow throughout discharge.     Barriers to Discharge: Continued Medical Work up  Expected Discharge Plan and Services   In-house Referral: Clinical Social Work     Living arrangements for the past 2 months: Apartment                                       Social Determinants of Health (SDOH) Interventions Food Insecurity Interventions: Other (Comment) (patient has food stamps) Financial Strain Interventions: Intervention Not Indicated Housing Interventions: Intervention Not Indicated Transportation Interventions: [facility]  Readmission Risk Interventions Readmission Risk Prevention Plan 01/11/2021 03/08/2020  Transportation Screening Complete Complete  Medication Review Press photographer) - Complete  PCP or Specialist appointment within  3-5 days of discharge Complete Complete  HRI or [place] Complete Complete  SW Recovery Care/Counseling Consult Complete Complete  Palliative Care Screening Not Applicable Not [place] Not Applicable Not Applicable  Some recent data might be hidden   Tilley Faeth, MSW, LCSWA [phone] Heart Failure Social Worker

## 2021-02-02 NOTE — Discharge Summary (Signed)
Discharge Summary  [name] PIR:[id number] DOB: 1955/10/30  PCP: [name], NP  Admit date: 01/29/2021 Discharge date: 02/02/2021  Time spent: 40 mins  Recommendations for Outpatient Follow-up:  Follow-up with heart failure/cardiology in 1 week Follow-up with nephrology in 3 weeks Follow-up with PCP in 1 week  Discharge Diagnoses:  Active Hospital Problems   Diagnosis Date Noted   Acute exacerbation of CHF (congestive heart failure) ([place]) 01/29/2021   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   Chronic combined systolic and diastolic heart failure ([place]) 05/06/2020   Hypokalemia 02/21/2019   Acute kidney injury superimposed on CKD ([place]) 02/21/2019   Diabetes mellitus type 2 in obese ([place]) 12/26/2017   Chest pain 02/26/2012   OSA (obstructive sleep apnea) 09/21/2011   Atrial fibrillation ([place]) 02/04/2010    Resolved Hospital Problems  No resolved problems to display.    Discharge Condition: Stable  Diet recommendation: Heart healthy  Vitals:   02/02/21 0917 02/02/21 1200  BP: 114/65 117/79  Pulse:    Resp: 18 19  Temp: 98.3 F (36.8 C) (P) 97.7 F (36.5 C)  SpO2: 99% (P) 98%    History of present illness:  Patient is a 65 year old female with history of type 2 diabetes, chronic A. fib, chronic kidney disease,, systolic/diastolic positive heart failure, OSA, recent mitral valve surgery on May 11 by Dr. Roxan Hockey secondary to severe mitral regurgitation who presented with 2-day history of chest pain, shortness of breath.  Chest pain was on the right anterior chest wall adjacent to the incision site.  Pain was rated as 8/10.  Also reported orthopnea.  She is on oxygen at 2 L/min via nasal cannula at home as needed.  On presentation she was hemodynamically stable.  Lab work showed severe hypokalemia, mild elevated troponin.  Lab work showed AKI with creatinine of 3.58.  Patient was admitted for the management of acute exacerbation of congestive heart  failure.   Today, patient denies any new complaints, reports feeling better, denies any chest pain, worsening shortness of breath, abdominal pain, nausea/vomiting, fever/chills.  Patient stable to be discharged home with home health PT/OT.  Patient advised of medication changes as well as appointment compliance.    Hospital Course:  Principal Problem:   Acute exacerbation of CHF (congestive heart failure) (HCC) Active Problems:   Atrial fibrillation (HCC)   OSA (obstructive sleep apnea)   Chest pain   Diabetes mellitus type 2 in obese (HCC)   Hypokalemia   Acute kidney injury superimposed on CKD (HCC)   Chronic combined systolic and diastolic heart failure (HCC)   S/P mitral valve repair   Acute on chronic systolic/diastolic congestive heart failure Recently EF of 60 to 65%, MR/TR status post recent repair Noted to be volume overloaded on presentation Cardiology/HF on board, RHC done showed low filling pressures Upon discharge, restart torsemide at a lower dose 40 mg daily, Toprol at a lower dose 25 mg daily. Discontinue metolazone and Lasix Appointment made to follow-up with heart failure team in 1 week   AKI on CKD stage IIIb Improving Baseline creatinine ranges from 1.6-1.8, on presentation, 3  Outpatient follow-up with nephrology in 3 weeks   Hypokalemia Replaced prn   History of MVR/TVR Underwent repair with maze procedure on 5/22 by Dr. Roxan Hockey   Permanent A. Fib s/p DCCV on 02/01/21 Now in sinus rhythm Continue amiodarone.  On Eliquis for anticoagulation   Hypertension Continue home regimen   OSA On CPAP.   Diabetes type 2 Continue home regimen  Atypical chest pain Mainly on the incision site.  Continue as needed meds.  No evidence of ischemia.   Generalized weakness PT/OT consulted-Home health PT/OT   Morbid obesity BMI 40 Lifestyle modification advised     Estimated body mass index is 40.66 kg/m (pended) as calculated from the following:    Height as of this encounter: (P) $RemoveBefore'5\' 6"'cGFZBcqtabLRb$  (1.676 m).   Weight as of this encounter: 114.3 kg.    Procedures: DCCV  Consultations: Cardiology/HF  Discharge Exam: BP 117/79   Pulse 74   Temp (P) 97.7 F (36.5 C)   Resp 19   Ht (P) $Remo'5\' 6"'OUVrG$  (1.676 m)   Wt 114.3 kg   SpO2 (P) 98%   BMI (P) 40.66 kg/m    General: NAD, obese Cardiovascular: S1, S2 present Respiratory: CTAB, surgical scar noted on chest wall Abdomen: Soft, nontender, nondistended, bowel sounds present Musculoskeletal: No bilateral pedal edema noted Skin: Normal Psychiatry: Normal mood    Discharge Instructions You were cared for by a hospitalist during your hospital stay. If you have any questions about your discharge medications or the care you received while you were in the hospital after you are discharged, you can call the unit and asked to speak with the hospitalist on call if the hospitalist that took care of you is not available. Once you are discharged, your primary care physician will handle any further medical issues. Please note that NO REFILLS for any discharge medications will be authorized once you are discharged, as it is imperative that you return to your primary care physician (or establish a relationship with a primary care physician if you do not have one) for your aftercare needs so that they can reassess your need for medications and monitor your lab values.  Discharge Instructions     Diet - low sodium heart healthy   Complete by: As directed    Increase activity slowly   Complete by: As directed    No wound care   Complete by: As directed       Allergies as of 02/02/2021       Reactions   Sulfa Antibiotics Itching        Medication List     STOP taking these medications    furosemide 40 MG tablet Commonly known as: LASIX   metolazone 5 MG tablet Commonly known as: ZAROXOLYN       TAKE these medications    Accu-Chek Guide Me w/Device Kit Use as instructed to check blood  sugar three times daily. E11.69   Accu-Chek Guide w/Device Kit 1 each by Other route in the morning, at noon, and at bedtime.   Accu-Chek Softclix Lancets lancets Use as instructed to check blood sugar three times daily. E11.69 What changed:  how much to take how to take this when to take this   albuterol 108 (90 Base) MCG/ACT inhaler Commonly known as: VENTOLIN HFA Inhale 2 puffs into the lungs every 4 (four) hours as needed for wheezing or shortness of breath.   amiodarone 200 MG tablet Commonly known as: Pacerone Take 1 tablet (200 mg total) by mouth daily.   apixaban 5 MG Tabs tablet Commonly known as: Eliquis Take 1 tablet (5 mg total) by mouth 2 (two) times daily.   B-D SINGLE USE SWABS REGULAR Pads 1 each by Other route daily.   B-D ULTRAFINE III SHORT PEN 31G X 8 MM Misc Generic drug: Insulin Pen Needle 1 each by Other route in the morning, at noon,  in the evening, and at bedtime.   DULoxetine 60 MG capsule Commonly known as: CYMBALTA Take 1 capsule (60 mg total) by mouth daily.   EPINEPHrine 0.3 mg/0.3 mL Soaj injection Commonly known as: EPI-PEN Inject 0.3 mg into the muscle as needed for anaphylaxis.   insulin aspart 100 UNIT/ML injection Commonly known as: NovoLOG Inject 12 Units into the skin 3 (three) times daily before meals. For blood sugars 0-199 give 0 units of insulin, 201-250 give 4 units, 251-300 give 6 units, 301-350 give 8 units, 351-400 give 10 units,> 400 give 12 units and call M.D. What changed:  how much to take additional instructions   Levemir 100 UNIT/ML injection Generic drug: insulin detemir INJECT 0.4 MLS (40 UNITS TOTAL) INTO THE SKIN 2 (TWO) TIMES DAILY. What changed: See the new instructions.   metoprolol succinate 25 MG 24 hr tablet Commonly known as: TOPROL-XL Take 1 tablet (25 mg total) by mouth daily. What changed:  medication strength how much to take   MULTIVITAMIN WOMEN PO Take 1 tablet by mouth daily.    nitroGLYCERIN 0.4 MG SL tablet Commonly known as: NITROSTAT Place 1 tablet (0.4 mg total) under the tongue every 5 (five) minutes x 3 doses as needed for chest pain.   oxyCODONE 5 MG immediate release tablet Commonly known as: Oxy IR/ROXICODONE Take 1 tablet (5 mg total) by mouth every 4 (four) hours as needed for severe pain.   pantoprazole 40 MG tablet Commonly known as: PROTONIX Take 40 mg by mouth 2 (two) times daily.   Potassium Chloride ER 20 MEQ Tbcr Take 20 mEq by mouth daily.   Restasis 0.05 % ophthalmic emulsion Generic drug: cycloSPORINE Place 1 drop into both eyes 2 (two) times daily.   rosuvastatin 10 MG tablet Commonly known as: CRESTOR Take 1 tablet (10 mg total) by mouth daily.   torsemide 20 MG tablet Commonly known as: Demadex Take 2 tablets (40 mg total) by mouth daily. Start taking on: February 03, 2021 What changed: how much to take   True Metrix Blood Glucose Test test strip Generic drug: glucose blood USE AS INSTRUCTED TO CHECK BLOOD SUGAR DAILY. What changed: See the new instructions.   Trulicity 1.5 YQ/6.5HQ Sopn Generic drug: Dulaglutide INJECT 1.5 MG INTO THE SKIN ONCE A WEEK.       Allergies  Allergen Reactions   Sulfa Antibiotics Itching    Follow-up Information     [facility] Follow up on 02/10/2021.   Specialty: Cardiology Why: 1200 Contact information: [address] [id number] [place]        Madelon Lips, MD. Schedule an appointment as soon as possible for a visit in 3 week(s).   Specialty: Nephrology Contact information: [place] [address] [phone]         [name], NP. Schedule an appointment as soon as possible for a visit in 1 week(s).   Specialty: Internal Medicine Contact information: [place] [address] [phone]         Buford Dresser, MD .    Specialty: Cardiology Contact information: [address] [place] [address] [phone]                  The results of significant diagnostics from this hospitalization (including imaging, microbiology, ancillary and laboratory) are listed below for reference.    Significant Diagnostic Studies: CARDIAC CATHETERIZATION  Result Date: 01/31/2021 Findings: RA =  5 RV = 33/2 PA = 35/13 (22) PCW = 13 Fick cardiac output/index = 4.3/2.0 PVR = 2.1 WU FA sat = 97% PA sat = 54%, 54% Assessment: 1. Low filling pressures with moderately reduced CO. Plan/Discussion: Would hold diuretics for now. Hopefully CO will improve with DC-CV. Glori Bickers, MD 2:48 PM  DG Chest Portable 1 View  Result Date: 01/29/2021 CLINICAL DATA:  Chest pain.  Recent open heart surgery EXAM: PORTABLE CHEST 1 VIEW COMPARISON:  01/09/2021 FINDINGS: Cardiomegaly. Prior mitral and tricuspid valve repair. Left atrial clipping. There is no edema, consolidation, effusion, or pneumothorax. IMPRESSION: Stable postoperative chest.  No evidence of active disease. Electronically Signed   By: Monte Fantasia M.D.   On: 01/29/2021 11:32   DG CHEST PORT 1 VIEW  Result Date: 01/09/2021 CLINICAL DATA:  Mitral valve repair on 12/29/2020. Patient with shortness of breath. Previous pneumothorax and pleural effusion. EXAM: PORTABLE CHEST 1 VIEW COMPARISON:  01/05/2021 and older exams. FINDINGS: Stable enlargement of the cardiopericardial silhouette and changes from mitral and tricuspid valve replacement. No mediastinal or hilar masses. No mediastinal widening. Linear opacities at the left lung base are stable consistent with atelectasis. Remainder of the lungs is clear. No convincing pleural effusion.  No pneumothorax. Stable right-sided PICC. IMPRESSION: 1. No acute findings or evidence of an operative complication. 2. No pneumothorax.  No visualized pleural effusion. Electronically Signed   By: Lajean Manes M.D.   On:  01/09/2021 10:07   DG Chest Port 1 View  Result Date: 01/05/2021 CLINICAL DATA:  Status post mitral valve repair, shortness of breath. EXAM: PORTABLE CHEST 1 VIEW COMPARISON:  Jan 03, 2021. FINDINGS: Stable cardiomegaly. Right-sided PICC line is unchanged. No pneumothorax or pleural effusion is noted. Lungs are clear. Bony thorax is unremarkable. IMPRESSION: No active disease. Electronically Signed   By: Marijo Conception M.D.   On: 01/05/2021 08:12    Microbiology: Recent Results (from the past 240 hour(s))  Resp Panel by RT-PCR (Flu A&B, Covid) Nasopharyngeal Swab     Status: None   Collection Time: 01/29/21 11:12 AM   Specimen: Nasopharyngeal Swab; Nasopharyngeal(NP) swabs in vial transport medium  Result Value Ref Range Status   SARS Coronavirus 2 by RT PCR NEGATIVE NEGATIVE Final    Comment: (NOTE) SARS-CoV-2 target nucleic acids are NOT DETECTED.  The SARS-CoV-2 RNA is generally detectable in upper respiratory specimens during the acute phase of infection. The lowest concentration of SARS-CoV-2 viral copies this assay can detect is 138 copies/mL. A negative result does not preclude SARS-Cov-2 infection and should not be used as the sole basis for treatment or other patient management decisions. A negative result may occur with  improper specimen collection/handling, submission of specimen other than nasopharyngeal swab, presence of viral mutation(s) within the areas targeted by this assay, and inadequate number of viral copies(<138 copies/mL). A negative result must be combined with clinical observations, patient history, and epidemiological information. The expected result is Negative.  Fact Sheet for Patients:  EntrepreneurPulse.com.au  Fact Sheet for Healthcare Providers:  IncredibleEmployment.be  This test is no t yet approved or cleared by the [place] FDA and  has been authorized for detection and/or diagnosis of SARS-CoV-2 by FDA  under an Emergency Use Authorization (EUA). This EUA will remain  in effect (meaning this test can be used) for the duration of the COVID-19 declaration under Section 564(b)(1) of the Act, 21 U.S.C.section 360bbb-3(b)(1), unless the authorization is terminated  or revoked sooner.  Influenza A by PCR NEGATIVE NEGATIVE Final   Influenza B by PCR NEGATIVE NEGATIVE Final    Comment: (NOTE) The Xpert Xpress SARS-CoV-2/FLU/RSV plus assay is intended as an aid in the diagnosis of influenza from Nasopharyngeal swab specimens and should not be used as a sole basis for treatment. Nasal washings and aspirates are unacceptable for Xpert Xpress SARS-CoV-2/FLU/RSV testing.  Fact Sheet for Patients: EntrepreneurPulse.com.au  Fact Sheet for Healthcare Providers: IncredibleEmployment.be  This test is not yet approved or cleared by the [place] FDA and has been authorized for detection and/or diagnosis of SARS-CoV-2 by FDA under an Emergency Use Authorization (EUA). This EUA will remain in effect (meaning this test can be used) for the duration of the COVID-19 declaration under Section 564(b)(1) of the Act, 21 U.S.C. section 360bbb-3(b)(1), unless the authorization is terminated or revoked.  Performed at [facility], [place] [address], [place], [place] [address]      Labs: Basic Metabolic Panel: Recent Labs  Lab 01/29/21 2146 01/30/21 0211 01/30/21 1443 01/31/21 0640 01/31/21 1409 01/31/21 1452 02/01/21 0211 02/02/21 0318  NA  --  134*  --  136 139  139 136 135 139  K  --  3.1* 3.5 2.9* 3.3*  3.2* 3.6 2.9* 3.6  CL  --  89*  --  92*  --  92* 93* 100  CO2  --  36*  --  34*  --  29 32 27  GLUCOSE  --  171*  --  79  --  92 110* 86  BUN  --  56*  --  64*  --  61* 59* 54*  CREATININE  --  3.23*  --  3.55*  --  3.24* 3.15* 2.70*  CALCIUM  --  9.9  --  9.8  --  10.0 9.6 9.6  MG 1.9  --  2.0  --   --   --  2.0 2.0   Liver Function  Tests: Recent Labs  Lab 01/29/21 1104  AST 39  ALT 33  ALKPHOS 94  BILITOT 0.8  PROT 8.6*  ALBUMIN 3.5   No results for input(s): LIPASE, AMYLASE in the last 168 hours. No results for input(s): AMMONIA in the last 168 hours. CBC: Recent Labs  Lab 01/29/21 1104 01/31/21 0640 01/31/21 1409 02/02/21 0318  WBC 6.6 7.1  --  7.2  NEUTROABS 3.6 3.4  --  3.5  HGB 11.5* 11.6* 12.6  12.2 10.8*  HCT 36.5 36.1 37.0  36.0 34.6*  MCV 85.9 85.1  --  88.0  PLT 239 260  --  245   Cardiac Enzymes: No results for input(s): CKTOTAL, CKMB, CKMBINDEX, TROPONINI in the last 168 hours. BNP: BNP (last 3 results) Recent Labs    11/22/20 1527 12/19/20 1016 01/29/21 1104  BNP 132.8* 227.9* 33.6    ProBNP (last 3 results) No results for input(s): PROBNP in the last 8760 hours.  CBG: Recent Labs  Lab 02/01/21 1517 02/01/21 2156 02/02/21 0534 02/02/21 1138 02/02/21 1626  GLUCAP 184* 146* 81 118* 148*       Signed:  Alma Friendly, MD [facility] 02/02/2021, 7:28 PM

## 2021-02-02 NOTE — Telephone Encounter (Signed)
Called to request a call from the nurse or doctor regarding a diabetic shoe for patient.  Stated that they have been trying to get the shoe since February 2022.  Please call to discuss further at [phone]

## 2021-02-02 NOTE — Progress Notes (Signed)
  [facility] Progress Note   Assessment/ Plan:   AKI on CKD IV: Progressive CKD in the setting of previous hemodynamic insults and now possible decompensated CHF.  HF following, getting RHC--> looks like filling pressures are low, diuretics being held, Cr improving - no indication for dialysis at this time - restart torsemide 40 mg daily  - renal function is improving - will follow along and help arrange OP followup--> 3-4 weeks after discharge.    Acute on Chronic systolic CHF: per HF team, diuretics being held Afib: on amiodarone, s/p DCCV 6/14 DM II: per primary Dispo: Ok from renal perspective to d/c today  Subjective:   Cr 2.7 today.  Feeling well, eating lunch.  D/w Dr [name]- restart torsemide at 40 mg daily.     Objective:   BP 117/79   Pulse 74   Temp (P) 97.7 F (36.5 C)   Resp 19   Ht (P) 5\' 6"  (1.676 m)   Wt 114.3 kg   SpO2 (P) 98%   BMI (P) 40.66 kg/m   Physical Exam: Gen: NAD, sitting in chair CVS: RRR Resp: clear Abd: soft Ext: no LE edema  Labs: BMET Recent Labs  Lab 01/29/21 1104 01/30/21 0211 01/30/21 1443 01/31/21 0640 01/31/21 1409 01/31/21 1452 02/01/21 0211 02/02/21 0318  NA 136 134*  --  136 139  139 136 135 139  K 2.5* 3.1* 3.5 2.9* 3.3*  3.2* 3.6 2.9* 3.6  CL 89* 89*  --  92*  --  92* 93* 100  CO2 34* 36*  --  34*  --  29 32 27  GLUCOSE 207* 171*  --  79  --  92 110* 86  BUN 57* 56*  --  64*  --  61* 59* 54*  CREATININE 3.58* 3.23*  --  3.55*  --  3.24* 3.15* 2.70*  CALCIUM 9.7 9.9  --  9.8  --  10.0 9.6 9.6   CBC Recent Labs  Lab 01/29/21 1104 01/31/21 0640 01/31/21 1409 02/02/21 0318  WBC 6.6 7.1  --  7.2  NEUTROABS 3.6 3.4  --  3.5  HGB 11.5* 11.6* 12.6  12.2 10.8*  HCT 36.5 36.1 37.0  36.0 34.6*  MCV 85.9 85.1  --  88.0  PLT 239 260  --  245      Medications:     amiodarone  200 mg Oral Daily   apixaban  5 mg Oral BID   cycloSPORINE  1 drop Both Eyes BID   DULoxetine  60 mg Oral Daily    insulin aspart  0-15 Units Subcutaneous TID WC   insulin aspart  5 Units Subcutaneous TID AC   insulin detemir  50 Units Subcutaneous BID   [START ON 02/03/2021] metoprolol succinate  25 mg Oral Daily   multivitamin with minerals  1 tablet Oral Daily   pantoprazole  40 mg Oral BID   rosuvastatin  10 mg Oral Daily   sodium chloride flush  3 mL Intravenous Q12H   sodium chloride flush  3 mL Intravenous Q12H     Madelon Lips, MD 02/02/2021, 1:09 PM

## 2021-02-02 NOTE — Progress Notes (Signed)
Patient has discharge orders in. RN has went over AVS summary with patient and her ride is her to get her.

## 2021-02-02 NOTE — Progress Notes (Addendum)
Advanced Heart Failure Rounding Note  PCP-Cardiologist: Buford Dresser, MD   Subjective:   RHC 6/13 with low filling pressures. Diuretics held  6/14 S/P DC-CV--> NSR   Creatinine trending down 3.5>3.1>>2.7   Feeling anxious today. Denies SOB>    Objective:   Weight Range: 114.3 kg Body mass index is 40.66 kg/m (pended).   Vital Signs:   Temp:  [97.2 F (36.2 C)-98.2 F (36.8 C)] 98.1 F (36.7 C) (06/15 0347) Pulse Rate:  [74-88] 74 (06/15 0347) Resp:  [14-24] 16 (06/15 0347) BP: (103-167)/(48-76) 109/48 (06/15 0347) SpO2:  [99 %-100 %] 100 % (06/15 0347) Weight:  [114.3 kg] 114.3 kg (06/15 0539) Last BM Date: 01/31/21  Weight change: Filed Weights   01/31/21 0500 02/01/21 0446 02/02/21 0539  Weight: 113.7 kg 113.6 kg 114.3 kg    Intake/Output:   Intake/Output Summary (Last 24 hours) at 02/02/2021 0809 Last data filed at 02/02/2021 0546 Gross per 24 hour  Intake 580 ml  Output 1301 ml  Net -721 ml      Physical Exam  General:  In bed. No resp difficulty HEENT: normal Neck: supple. no JVD. Carotids 2+ bilat; no bruits. No lymphadenopathy or thryomegaly appreciated. Cor: PMI nondisplaced. Regular rate & rhythm. No rubs, gallops or murmurs. Lungs: clear Abdomen: soft, nontender, nondistended. No hepatosplenomegaly. No bruits or masses. Good bowel sounds. Extremities: no cyanosis, clubbing, rash, edema Neuro: alert & orientedx3, cranial nerves grossly intact. moves all 4 extremities w/o difficulty. Affect pleasant   Telemetry  NSR 70-0s QTc > 520   Labs    CBC Recent Labs    01/31/21 0640 01/31/21 1409 02/02/21 0318  WBC 7.1  --  7.2  NEUTROABS 3.4  --  3.5  HGB 11.6* 12.6  12.2 10.8*  HCT 36.1 37.0  36.0 34.6*  MCV 85.1  --  88.0  PLT 260  --  124   Basic Metabolic Panel Recent Labs    02/01/21 0211 02/02/21 0318  NA 135 139  K 2.9* 3.6  CL 93* 100  CO2 32 27  GLUCOSE 110* 86  BUN 59* 54*  CREATININE 3.15* 2.70*   CALCIUM 9.6 9.6  MG 2.0 2.0   Liver Function Tests No results for input(s): AST, ALT, ALKPHOS, BILITOT, PROT, ALBUMIN in the last 72 hours.  No results for input(s): LIPASE, AMYLASE in the last 72 hours. Cardiac Enzymes No results for input(s): CKTOTAL, CKMB, CKMBINDEX, TROPONINI in the last 72 hours.  BNP: BNP (last 3 results) Recent Labs    11/22/20 1527 12/19/20 1016 01/29/21 1104  BNP 132.8* 227.9* 33.6    ProBNP (last 3 results) No results for input(s): PROBNP in the last 8760 hours.   D-Dimer No results for input(s): DDIMER in the last 72 hours. Hemoglobin A1C No results for input(s): HGBA1C in the last 72 hours.  Fasting Lipid Panel No results for input(s): CHOL, HDL, LDLCALC, TRIG, CHOLHDL, LDLDIRECT in the last 72 hours. Thyroid Function Tests No results for input(s): TSH, T4TOTAL, T3FREE, THYROIDAB in the last 72 hours.  Invalid input(s): FREET3  Other results:   Imaging    No results found.   Medications:     Scheduled Medications:  amiodarone  200 mg Oral Daily   apixaban  5 mg Oral BID   cycloSPORINE  1 drop Both Eyes BID   DULoxetine  60 mg Oral Daily   insulin aspart  0-15 Units Subcutaneous TID WC   insulin aspart  5 Units Subcutaneous TID AC  insulin detemir  50 Units Subcutaneous BID   metoprolol succinate  50 mg Oral Daily   multivitamin with minerals  1 tablet Oral Daily   pantoprazole  40 mg Oral BID   potassium chloride  40 mEq Oral BID   rosuvastatin  10 mg Oral Daily   sodium chloride flush  3 mL Intravenous Q12H   sodium chloride flush  3 mL Intravenous Q12H    Infusions:  sodium chloride     sodium chloride      PRN Medications: sodium chloride, sodium chloride, acetaminophen, albuterol, nitroGLYCERIN, ondansetron (ZOFRAN) IV, oxyCODONE, sodium chloride flush, sodium chloride flush     Assessment/Plan  A/C Systolic Heart Failure -TEE 4/22 EF 45-50% RV moderately HK - 12/2020 cho LVEF 60-65% RV moderately HK.  Moderate TR. No significant MR. No effusion. - Diuresed on admit. Creatinine has been elevated. -6/13 RHC  with low filling pressures. Diuretics stopped. - Volume status stable.   -No SGLT2i with GFR < 20 - No spiro/ari elevated creatinine..   2. A fib -02/01/21 S/P DC-CV--> NSR  - Continue amio 200 mg daily - Continue eliquis 5 mg twice a day   3. AKI -Creatinine on admit 3.5. -Had RHC today with low filling pressures. Diuretics held. - Creatinine trending down 3.5>>3.1 >>2.7    4. Valvular Heart Disease S/P MVR/ TVR -Severe TR s/p annuloplasty. Severe MR s/p annuloplasty on 12/30/20 - Echo 01/02/21 LVEF 60-65%  RV moderately HK. Moderate TR. No significant MR. No effusion - CT surgery  aware of admit.  . Obesity  Body mass index is 40.66 kg/m (pended).    6. DMII On SSI   7. Hypokalemia  K 3.6   Ask HFSW meet with her for coping strategies.   QTc prolonged. Obtain EKG   Length of Stay: 4  Amy Clegg, NP  02/02/2021, 8:09 AM  Advanced Heart Failure Team Pager [phone] (M-F; 7a - 5p)  Please contact [facility] for night-coverage after hours (5p -7a ) and weekends on [email or web address]  Patient seen and examined with the above-signed Advanced Practice Provider and/or Housestaff. I personally reviewed laboratory data, imaging studies and relevant notes. I independently examined the patient and formulated the important aspects of the plan. I have edited the note to reflect any of my changes or salient points. I have personally discussed the plan with the patient and/or family.   Remains in NSR. SCr improving. Weight up 1 pound. Seems anxious and depressed. No orthopnea or PND  General:  Lying in bed No resp difficulty HEENT: normal Neck: supple. no JVD. Carotids 2+ bilat; no bruits. No lymphadenopathy or thryomegaly appreciated. Cor: PMI nondisplaced. Regular rate & rhythm. No rubs, gallops or murmurs. Lungs: clear Abdomen: obese soft, nontender, nondistended. No  hepatosplenomegaly. No bruits or masses. Good bowel sounds. Extremities: no cyanosis, clubbing, rash, edema Neuro: alert & orientedx3, cranial nerves grossly intact. moves all 4 extremities w/o difficulty. Affect flat  Remains in NSR. Volume status ok. SCr improving.   Discussed with Dr. Hollie Salk. From cardiac/renal perspective likely can go home today. Would cut home discharge to 40 daily (start tomorrow) and follow BMET next week.   Home cardiac meds  Amio 200 daily Eliquis 5 bid Torsemide 40 daily (start 6/16) Crestor 10 daily  Toprol 25 daily (lower dose) Kdur 20 daily  Please make sure she is not taking bot furosemide and torsemide (was on both on home med list)  Will arrange HF f/u. Need CR referral. Eventually add SGLT2i.  Glori Bickers, MD  10:04 AM

## 2021-02-02 NOTE — Telephone Encounter (Signed)
Pt called back in today requesting a call back concerning the diabetic shoes. Please advise

## 2021-02-03 ENCOUNTER — Encounter (HOSPITAL_COMMUNITY): Payer: Self-pay | Admitting: Internal Medicine

## 2021-02-03 ENCOUNTER — Telehealth: Payer: Self-pay

## 2021-02-03 DIAGNOSIS — E1169 Type 2 diabetes mellitus with other specified complication: Secondary | ICD-10-CM

## 2021-02-03 NOTE — Telephone Encounter (Signed)
Transition Care Management Follow-up Telephone Call Date of discharge and from where: 02/02/2021, [facility] How have you been since you were released from the hospital? She said she is doing fine.  Any questions or concerns? Yes - she needs a glucometer and a new CPAP machine.   Items Reviewed: Did the pt receive and understand the discharge instructions provided? Yes  Medications obtained and verified? Yes - she said she has all medications and did not have any questions about her med regime.  Other? No  Any new allergies since your discharge? No  Do you have support at home? Yes   Home Care and Equipment/Supplies: Were home health services ordered? yes If so, what is the name of the agency? [facility]  Has the agency set up a time to come to the patient's home? yes Were any new equipment or medical supplies ordered?  No What is the name of the medical supply agency? N/a Were you able to get the supplies/equipment? not applicable Do you have any questions related to the use of the equipment or supplies? No  She already has O2 at home - uses @ 2L occasionally.   Functional Questionnaire: (I = Independent and D = Dependent) ADLs: independent. Has cane to use with ambulation   Follow up appointments reviewed:  PCP Hospital f/u appt confirmed? Yes  Scheduled to see Juluis Mire, NP  on 02/08/2021 @ 0930. [place] [facility] f/u appt confirmed? Yes  Scheduled to see cardioversion- 02/09/2021; Heart and Vascular - 6/23/2022CTS- 02/22/2021; CHF- 02/23/2021 . Are transportation arrangements needed? No  If their condition worsens, is the pt aware to call PCP or go to the Emergency Dept.? Yes Was the patient provided with contact information for the PCP's office or ED? Yes Was to pt encouraged to call back with questions or concerns? Yes

## 2021-02-04 ENCOUNTER — Other Ambulatory Visit (INDEPENDENT_AMBULATORY_CARE_PROVIDER_SITE_OTHER): Payer: Self-pay | Admitting: Primary Care

## 2021-02-04 DIAGNOSIS — G4733 Obstructive sleep apnea (adult) (pediatric): Secondary | ICD-10-CM

## 2021-02-04 MED ORDER — ONETOUCH DELICA LANCETS 33G MISC: 6 refills | Status: DC

## 2021-02-04 MED ORDER — ONETOUCH VERIO VI STRP: ORAL_STRIP | 6 refills | Status: DC

## 2021-02-04 MED ORDER — ONETOUCH VERIO W/DEVICE KIT: PACK | 0 refills | Status: DC

## 2021-02-04 NOTE — Addendum Note (Signed)
Addended by: Daisy Blossom, Annie Main L on: 02/04/2021 04:06 PM   Modules accepted: Orders

## 2021-02-04 NOTE — Telephone Encounter (Signed)
Rx sent 

## 2021-02-04 NOTE — Telephone Encounter (Signed)
completed

## 2021-02-07 ENCOUNTER — Inpatient Hospital Stay (HOSPITAL_COMMUNITY)
Admission: EM | Admit: 2021-02-07 | Discharge: 2021-02-10 | DRG: 315 | Disposition: A | Discharge status: Home Health | Payer: Medicare Other | Attending: Family Medicine | Admitting: Family Medicine

## 2021-02-07 ENCOUNTER — Emergency Department (HOSPITAL_COMMUNITY): Payer: Medicare Other

## 2021-02-07 ENCOUNTER — Other Ambulatory Visit: Payer: Self-pay

## 2021-02-07 ENCOUNTER — Telehealth: Payer: Self-pay | Admitting: Cardiology

## 2021-02-07 DIAGNOSIS — I5042 Chronic combined systolic (congestive) and diastolic (congestive) heart failure: Secondary | ICD-10-CM | POA: Diagnosis not present

## 2021-02-07 DIAGNOSIS — Z9889 Other specified postprocedural states: Secondary | ICD-10-CM

## 2021-02-07 DIAGNOSIS — R112 Nausea with vomiting, unspecified: Secondary | ICD-10-CM

## 2021-02-07 DIAGNOSIS — I4819 Other persistent atrial fibrillation: Secondary | ICD-10-CM | POA: Diagnosis not present

## 2021-02-07 DIAGNOSIS — N179 Acute kidney failure, unspecified: Secondary | ICD-10-CM | POA: Diagnosis not present

## 2021-02-07 DIAGNOSIS — Z794 Long term (current) use of insulin: Secondary | ICD-10-CM

## 2021-02-07 DIAGNOSIS — E785 Hyperlipidemia, unspecified: Secondary | ICD-10-CM | POA: Diagnosis present

## 2021-02-07 DIAGNOSIS — Z882 Allergy status to sulfonamides status: Secondary | ICD-10-CM

## 2021-02-07 DIAGNOSIS — R700 Elevated erythrocyte sedimentation rate: Secondary | ICD-10-CM | POA: Diagnosis present

## 2021-02-07 DIAGNOSIS — Z7901 Long term (current) use of anticoagulants: Secondary | ICD-10-CM

## 2021-02-07 DIAGNOSIS — I130 Hypertensive heart and chronic kidney disease with heart failure and stage 1 through stage 4 chronic kidney disease, or unspecified chronic kidney disease: Secondary | ICD-10-CM | POA: Diagnosis present

## 2021-02-07 DIAGNOSIS — J449 Chronic obstructive pulmonary disease, unspecified: Secondary | ICD-10-CM | POA: Diagnosis present

## 2021-02-07 DIAGNOSIS — E876 Hypokalemia: Secondary | ICD-10-CM

## 2021-02-07 DIAGNOSIS — N1832 Chronic kidney disease, stage 3b: Secondary | ICD-10-CM

## 2021-02-07 DIAGNOSIS — K219 Gastro-esophageal reflux disease without esophagitis: Secondary | ICD-10-CM | POA: Diagnosis present

## 2021-02-07 DIAGNOSIS — R0789 Other chest pain: Secondary | ICD-10-CM | POA: Diagnosis present

## 2021-02-07 DIAGNOSIS — F32A Depression, unspecified: Secondary | ICD-10-CM | POA: Diagnosis present

## 2021-02-07 DIAGNOSIS — Z952 Presence of prosthetic heart valve: Secondary | ICD-10-CM

## 2021-02-07 DIAGNOSIS — I970 Postcardiotomy syndrome: Secondary | ICD-10-CM | POA: Diagnosis not present

## 2021-02-07 DIAGNOSIS — N189 Chronic kidney disease, unspecified: Secondary | ICD-10-CM

## 2021-02-07 DIAGNOSIS — Z9841 Cataract extraction status, right eye: Secondary | ICD-10-CM

## 2021-02-07 DIAGNOSIS — Z8249 Family history of ischemic heart disease and other diseases of the circulatory system: Secondary | ICD-10-CM

## 2021-02-07 DIAGNOSIS — E669 Obesity, unspecified: Secondary | ICD-10-CM

## 2021-02-07 DIAGNOSIS — I484 Atypical atrial flutter: Secondary | ICD-10-CM | POA: Diagnosis present

## 2021-02-07 DIAGNOSIS — G4733 Obstructive sleep apnea (adult) (pediatric): Secondary | ICD-10-CM

## 2021-02-07 DIAGNOSIS — E1169 Type 2 diabetes mellitus with other specified complication: Secondary | ICD-10-CM

## 2021-02-07 DIAGNOSIS — Z86718 Personal history of other venous thrombosis and embolism: Secondary | ICD-10-CM

## 2021-02-07 DIAGNOSIS — Z9049 Acquired absence of other specified parts of digestive tract: Secondary | ICD-10-CM

## 2021-02-07 DIAGNOSIS — E119 Type 2 diabetes mellitus without complications: Secondary | ICD-10-CM

## 2021-02-07 DIAGNOSIS — E1122 Type 2 diabetes mellitus with diabetic chronic kidney disease: Secondary | ICD-10-CM | POA: Diagnosis present

## 2021-02-07 DIAGNOSIS — R079 Chest pain, unspecified: Secondary | ICD-10-CM | POA: Diagnosis not present

## 2021-02-07 DIAGNOSIS — Z90710 Acquired absence of both cervix and uterus: Secondary | ICD-10-CM

## 2021-02-07 DIAGNOSIS — I2510 Atherosclerotic heart disease of native coronary artery without angina pectoris: Secondary | ICD-10-CM | POA: Diagnosis present

## 2021-02-07 DIAGNOSIS — D631 Anemia in chronic kidney disease: Secondary | ICD-10-CM | POA: Diagnosis present

## 2021-02-07 DIAGNOSIS — Z20822 Contact with and (suspected) exposure to covid-19: Secondary | ICD-10-CM | POA: Diagnosis present

## 2021-02-07 DIAGNOSIS — Z6841 Body Mass Index (BMI) 40.0 and over, adult: Secondary | ICD-10-CM

## 2021-02-07 DIAGNOSIS — Z79899 Other long term (current) drug therapy: Secondary | ICD-10-CM

## 2021-02-07 DIAGNOSIS — N184 Chronic kidney disease, stage 4 (severe): Secondary | ICD-10-CM | POA: Diagnosis present

## 2021-02-07 DIAGNOSIS — Z9842 Cataract extraction status, left eye: Secondary | ICD-10-CM

## 2021-02-07 DIAGNOSIS — Z809 Family history of malignant neoplasm, unspecified: Secondary | ICD-10-CM

## 2021-02-07 DIAGNOSIS — I480 Paroxysmal atrial fibrillation: Secondary | ICD-10-CM | POA: Diagnosis not present

## 2021-02-07 LAB — BASIC METABOLIC PANEL
Anion gap: 11 (ref 5–15)
BUN: 55 mg/dL — ABNORMAL HIGH (ref 8–23)
CO2: 31 mmol/L (ref 22–32)
Calcium: 9.4 mg/dL (ref 8.9–10.3)
Chloride: 93 mmol/L — ABNORMAL LOW (ref 98–111)
Creatinine, Ser: 3.1 mg/dL — ABNORMAL HIGH (ref 0.44–1.00)
GFR, Estimated: 16 mL/min — ABNORMAL LOW (ref >60)
Glucose, Bld: 165 mg/dL — ABNORMAL HIGH (ref 70–99)
Potassium: 2.7 mmol/L — CL (ref 3.5–5.1)
Sodium: 135 mmol/L (ref 135–145)

## 2021-02-07 LAB — CBC
HCT: 37.4 % (ref 36.0–46.0)
Hemoglobin: 11.7 g/dL — ABNORMAL LOW (ref 12.0–15.0)
MCH: 27 pg (ref 26.0–34.0)
MCHC: 31.3 g/dL (ref 30.0–36.0)
MCV: 86.2 fL (ref 80.0–100.0)
Platelets: 270 10*3/uL (ref 150–400)
RBC: 4.34 MIL/uL (ref 3.87–5.11)
RDW: 14 % (ref 11.5–15.5)
WBC: 6.2 10*3/uL (ref 4.0–10.5)
nRBC: 0 % (ref 0.0–0.2)

## 2021-02-07 LAB — GLUCOSE, CAPILLARY: Glucose-Capillary: 144 mg/dL — ABNORMAL HIGH (ref 70–99)

## 2021-02-07 LAB — MAGNESIUM: Magnesium: 1.8 mg/dL (ref 1.7–2.4)

## 2021-02-07 LAB — SARS CORONAVIRUS 2 (TAT 6-24 HRS): SARS Coronavirus 2: NEGATIVE

## 2021-02-07 LAB — BRAIN NATRIURETIC PEPTIDE: B Natriuretic Peptide: 74.1 pg/mL (ref 0.0–100.0)

## 2021-02-07 LAB — TROPONIN I (HIGH SENSITIVITY)
Troponin I (High Sensitivity): 11 ng/L (ref <18)
Troponin I (High Sensitivity): 12 ng/L (ref <18)

## 2021-02-07 MED ORDER — OXYCODONE HCL 5 MG PO TABS
5.0000 mg | ORAL_TABLET | ORAL | Status: DC | PRN
  Administered 2021-02-07 – 2021-02-08 (×2): 5 mg via ORAL
  Filled 2021-02-07 (×2): qty 1

## 2021-02-07 MED ORDER — APIXABAN 5 MG PO TABS
5.0000 mg | ORAL_TABLET | Freq: Two times a day (BID) | ORAL | Status: DC
  Administered 2021-02-07 – 2021-02-10 (×6): 5 mg via ORAL
  Filled 2021-02-07 (×6): qty 1

## 2021-02-07 MED ORDER — POTASSIUM CHLORIDE 20 MEQ PO PACK: 20.0000 meq | PACK | Freq: Every day | ORAL | Status: DC

## 2021-02-07 MED ORDER — AMIODARONE LOAD VIA INFUSION
150.0000 mg | Freq: Once | INTRAVENOUS | Status: AC
  Administered 2021-02-07: 150 mg via INTRAVENOUS
  Filled 2021-02-07: qty 83.34

## 2021-02-07 MED ORDER — POTASSIUM CHLORIDE CRYS ER 20 MEQ PO TBCR
40.0000 meq | EXTENDED_RELEASE_TABLET | Freq: Once | ORAL | Status: AC
  Administered 2021-02-07: 40 meq via ORAL
  Filled 2021-02-07: qty 2

## 2021-02-07 MED ORDER — METOPROLOL TARTRATE 5 MG/5ML IV SOLN
5.0000 mg | Freq: Once | INTRAVENOUS | Status: AC
  Administered 2021-02-07: 5 mg via INTRAVENOUS
  Filled 2021-02-07: qty 5

## 2021-02-07 MED ORDER — AMIODARONE HCL IN DEXTROSE 360-4.14 MG/200ML-% IV SOLN
30.0000 mg/h | INTRAVENOUS | Status: DC
  Administered 2021-02-08 – 2021-02-09 (×3): 30 mg/h via INTRAVENOUS
  Filled 2021-02-07 (×3): qty 200

## 2021-02-07 MED ORDER — AMIODARONE HCL 200 MG PO TABS: 200.0000 mg | ORAL_TABLET | Freq: Every day | ORAL | Status: DC

## 2021-02-07 MED ORDER — AMIODARONE HCL IN DEXTROSE 360-4.14 MG/200ML-% IV SOLN
60.0000 mg/h | INTRAVENOUS | Status: DC
  Administered 2021-02-07 – 2021-02-08 (×2): 60 mg/h via INTRAVENOUS
  Filled 2021-02-07 (×2): qty 200

## 2021-02-07 MED ORDER — ACETAMINOPHEN 325 MG PO TABS: 650.0000 mg | ORAL_TABLET | Freq: Four times a day (QID) | ORAL | Status: DC | PRN

## 2021-02-07 MED ORDER — SODIUM CHLORIDE 0.9% FLUSH
3.0000 mL | Freq: Two times a day (BID) | INTRAVENOUS | Status: DC
  Administered 2021-02-07 – 2021-02-09 (×2): 3 mL via INTRAVENOUS

## 2021-02-07 MED ORDER — CYCLOSPORINE 0.05 % OP EMUL
1.0000 [drp] | Freq: Two times a day (BID) | OPHTHALMIC | Status: DC
  Administered 2021-02-07 – 2021-02-10 (×6): 1 [drp] via OPHTHALMIC
  Filled 2021-02-07 (×7): qty 1

## 2021-02-07 MED ORDER — METOPROLOL SUCCINATE ER 25 MG PO TB24
25.0000 mg | ORAL_TABLET | Freq: Every day | ORAL | Status: DC
  Administered 2021-02-08 – 2021-02-10 (×3): 25 mg via ORAL
  Filled 2021-02-07 (×3): qty 1

## 2021-02-07 MED ORDER — ROSUVASTATIN CALCIUM 5 MG PO TABS
10.0000 mg | ORAL_TABLET | Freq: Every day | ORAL | Status: DC
  Administered 2021-02-08 – 2021-02-10 (×3): 10 mg via ORAL
  Filled 2021-02-07 (×3): qty 2

## 2021-02-07 MED ORDER — PANTOPRAZOLE SODIUM 40 MG PO TBEC
40.0000 mg | DELAYED_RELEASE_TABLET | Freq: Two times a day (BID) | ORAL | Status: DC
  Administered 2021-02-07 – 2021-02-10 (×6): 40 mg via ORAL
  Filled 2021-02-07 (×6): qty 1

## 2021-02-07 MED ORDER — INSULIN ASPART 100 UNIT/ML IJ SOLN
0.0000 [IU] | Freq: Three times a day (TID) | INTRAMUSCULAR | Status: DC
  Administered 2021-02-08 (×3): 3 [IU] via SUBCUTANEOUS
  Administered 2021-02-09: 5 [IU] via SUBCUTANEOUS
  Administered 2021-02-09: 3 [IU] via SUBCUTANEOUS
  Administered 2021-02-09 – 2021-02-10 (×3): 2 [IU] via SUBCUTANEOUS

## 2021-02-07 MED ORDER — POTASSIUM CHLORIDE CRYS ER 20 MEQ PO TBCR
40.0000 meq | EXTENDED_RELEASE_TABLET | ORAL | Status: AC
  Administered 2021-02-07: 40 meq via ORAL
  Filled 2021-02-07: qty 2

## 2021-02-07 MED ORDER — NOVOLOG 100 UNIT/ML ~~LOC~~ SOLN: 12.0000 [IU] | Freq: Three times a day (TID) | SUBCUTANEOUS | Status: DC

## 2021-02-07 MED ORDER — ACETAMINOPHEN 650 MG RE SUPP: 650.0000 mg | Freq: Four times a day (QID) | RECTAL | Status: DC | PRN

## 2021-02-07 MED ORDER — DULOXETINE HCL 60 MG PO CPEP
60.0000 mg | ORAL_CAPSULE | Freq: Every day | ORAL | Status: DC
  Administered 2021-02-08 – 2021-02-10 (×3): 60 mg via ORAL
  Filled 2021-02-07 (×3): qty 1

## 2021-02-07 MED ORDER — ONDANSETRON HCL 4 MG PO TABS: 4.0000 mg | ORAL_TABLET | Freq: Four times a day (QID) | ORAL | Status: DC | PRN

## 2021-02-07 MED ORDER — POTASSIUM CHLORIDE 10 MEQ/100ML IV SOLN
10.0000 meq | Freq: Once | INTRAVENOUS | Status: AC
  Administered 2021-02-07: 10 meq via INTRAVENOUS
  Filled 2021-02-07: qty 100

## 2021-02-07 MED ORDER — ONDANSETRON HCL 4 MG/2ML IJ SOLN
4.0000 mg | Freq: Four times a day (QID) | INTRAMUSCULAR | Status: DC | PRN
  Administered 2021-02-08: 4 mg via INTRAVENOUS
  Filled 2021-02-07: qty 2

## 2021-02-07 MED ORDER — ALBUTEROL SULFATE (2.5 MG/3ML) 0.083% IN NEBU: 2.5000 mg | INHALATION_SOLUTION | Freq: Four times a day (QID) | RESPIRATORY_TRACT | Status: DC | PRN

## 2021-02-07 MED ORDER — POTASSIUM CHLORIDE CRYS ER 20 MEQ PO TBCR: 40.0000 meq | EXTENDED_RELEASE_TABLET | ORAL | Status: DC

## 2021-02-07 MED ORDER — INSULIN DETEMIR 100 UNIT/ML ~~LOC~~ SOLN
30.0000 [IU] | Freq: Two times a day (BID) | SUBCUTANEOUS | Status: DC
  Administered 2021-02-08 – 2021-02-10 (×5): 30 [IU] via SUBCUTANEOUS
  Filled 2021-02-07 (×6): qty 0.3

## 2021-02-07 NOTE — ED Notes (Signed)
Potassium 2.7. MD notified

## 2021-02-07 NOTE — ED Triage Notes (Signed)
Pt arrives via [facility] c/o midsternal CP radiating to R arm. EMS noted pt was in Afib RVR on EKG with HR 120-130. Pt was recently admitted for heart surgery.   EMS last VS - 130/80, RR 18, 98% on RA, HR 120

## 2021-02-07 NOTE — Telephone Encounter (Signed)
Routed to MD/RN as Juluis Rainier

## 2021-02-07 NOTE — ED Provider Notes (Signed)
[facility] EMERGENCY DEPARTMENT Provider Note   CSN: [id number] Arrival date & time: 02/07/21  1421     History Chief Complaint  Patient presents with   Chest Pain    [name] is a 65 y.o. female.  HPI Patient reports that she has felt poorly for several days.  She states that she has had extreme fatigue and she is felt short of breath with heaviness sensation in her chest.  She reports sometimes pain will radiate to the right arm.  Patient had a mitral valve replacement a little over a month ago.  She reports she is compliant with her medications.  She reports that she has not had any fever or chills or productive cough.  No calf pain or significant leg swelling.  She does feel like her heart is racing and she feels dizzy and lightheaded like she might pass out.  She reports she has had several episodes of vomiting.    Past Medical History:  Diagnosis Date   Allergic rhinitis    Arthritis    Asthma    Chest pain 12/27/2017   Chronic diastolic CHF (congestive heart failure) (HCC)    COPD (chronic obstructive pulmonary disease) (HCC)    Depression    DM (diabetes mellitus) ([place])    DVT (deep vein thrombosis) in pregnancy    Dysrhythmia    atrial fibrilation   GERD (gastroesophageal reflux disease)    Headache(784.0)    HTN (hypertension)    Hyperlipidemia    Obesity    Pneumonia 04/2018   RIGHT LOBE   Shortness of breath    Sleep apnea    compliant with CPAP    Patient Active Problem List   Diagnosis Date Noted   Acute exacerbation of CHF (congestive heart failure) ([place]) 01/29/2021   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   Encounter for preoperative dental examination    Teeth missing    Gingivitis    Accretions on teeth    CHF exacerbation ([place]) 12/21/2020   Acute on chronic heart failure ([place]) 12/19/2020   Atrial fibrillation, permanent ([place]) 11/02/2020   Chronic combined systolic and diastolic heart failure ([place]) 05/06/2020   NICM  (nonischemic cardiomyopathy) ([place]) 04/01/2020   Family history of heart disease 04/01/2020   Mitral regurgitation 04/01/2020   Severe tricuspid regurgitation 04/01/2020   Closed right ankle fracture 09/24/2019   DKA, type 2 ([place]) 09/24/2019   HHNC (hyperglycemic hyperosmolar nonketotic coma) ([place]) 02/21/2019   Hypokalemia 02/21/2019   Acute kidney injury superimposed on CKD ([place]) 02/21/2019   Acute on chronic left systolic heart failure ([place]) 07/01/2018   Congestive heart failure with left ventricular diastolic dysfunction, acute ([place]) 07/01/2018   Right upper lobe pneumonia 05/14/2018   Atrial fibrillation with RVR ([place]) 05/14/2018   Chronic atrial fibrillation (HCC) 62/70/3500   Diastolic dysfunction 93/81/8299   Diabetes mellitus type 2 in obese ([place]) 12/26/2017   Nausea vomiting and diarrhea    Chest pain 02/26/2012   OSA (obstructive sleep apnea) 09/21/2011   Hypoxia 09/21/2011   Diabetes mellitus ([place]) 02/04/2010   Morbid obesity ([place]) 02/04/2010   Atrial fibrillation ([place]) 02/04/2010   Gastroparesis 02/04/2010   Acute gastroenteritis 02/04/2010    Past Surgical History:  Procedure Laterality Date   ABDOMINAL HYSTERECTOMY     BUBBLE STUDY  11/26/2020   Procedure: BUBBLE STUDY;  Surgeon: Buford Dresser, MD;  Location: [facility];  Service: Cardiovascular;;   CARDIOVERSION N/A 05/06/2020   Procedure: CARDIOVERSION;  Surgeon: Buford Dresser, MD;  Location: [facility];  Service: Cardiovascular;  Laterality: N/A;   CARDIOVERSION N/A 11/04/2020   Procedure: CARDIOVERSION;  Surgeon: Lelon Perla, MD;  Location: [facility] ENDOSCOPY;  Service: Cardiovascular;  Laterality: N/A;   CARDIOVERSION N/A 02/01/2021   Procedure: CARDIOVERSION;  Surgeon: Jolaine Artist, MD;  Location: [facility] ENDOSCOPY;  Service: Cardiovascular;  Laterality: N/A;   CATARACT EXTRACTION, BILATERAL     CHOLECYSTECTOMY     COLONOSCOPY WITH PROPOFOL N/A 03/05/2015   Procedure: COLONOSCOPY WITH  PROPOFOL;  Surgeon: Carol Ada, MD;  Location: [facility] ENDOSCOPY;  Service: Endoscopy;  Laterality: N/A;   DIAGNOSTIC LAPAROSCOPY     ingrown hallux Left    KNEE SURGERY     LEFT HEART CATH AND CORONARY ANGIOGRAPHY N/A 12/31/2017   Procedure: LEFT HEART CATH AND CORONARY ANGIOGRAPHY;  Surgeon: Charolette Forward, MD;  Location: [facility] CV LAB;  Service: Cardiovascular;  Laterality: N/A;   MAZE N/A 12/29/2020   Procedure: MAZE;  Surgeon: [name], MD;  Location: [facility];  Service: Open Heart Surgery;  Laterality: N/A;   MITRAL VALVE REPAIR N/A 12/29/2020   Procedure: MITRAL VALVE REPAIR USING CARBOMEDICS ANNULOFLEX RING SIZE 30;  Surgeon: [name], MD;  Location: [facility];  Service: Open Heart Surgery;  Laterality: N/A;   RIGHT HEART CATH N/A 12/22/2020   Procedure: RIGHT HEART CATH;  Surgeon: Larey Dresser, MD;  Location: [facility] CV LAB;  Service: Cardiovascular;  Laterality: N/A;   RIGHT HEART CATH N/A 01/31/2021   Procedure: RIGHT HEART CATH;  Surgeon: Jolaine Artist, MD;  Location: [facility] CV LAB;  Service: Cardiovascular;  Laterality: N/A;   TEE WITHOUT CARDIOVERSION N/A 11/26/2020   Procedure: TRANSESOPHAGEAL ECHOCARDIOGRAM (TEE);  Surgeon: Buford Dresser, MD;  Location: [facility] ENDOSCOPY;  Service: Cardiovascular;  Laterality: N/A;   TEE WITHOUT CARDIOVERSION N/A 12/29/2020   Procedure: TRANSESOPHAGEAL ECHOCARDIOGRAM (TEE);  Surgeon: [name], MD;  Location: [facility];  Service: Open Heart Surgery;  Laterality: N/A;   TRICUSPID VALVE REPLACEMENT N/A 12/29/2020   Procedure: 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 RING SIZE 34;  Surgeon: [name], MD;  Location: [facility];  Service: Open Heart Surgery;  Laterality: N/A;     OB History   No obstetric history on file.     Family History  Problem Relation Age of Onset   Cancer Mother    Hypertension Mother    Cancer Father    CAD Other    Hypertension Sister    Hypertension  Brother     Social History   Tobacco Use   Smoking status: Never   Smokeless tobacco: Never  Vaping Use   Vaping Use: Never used  Substance Use Topics   Alcohol use: No   Drug use: No    Home Medications Prior to Admission medications   Medication Sig Start Date End Date Taking? Authorizing Provider  Accu-Chek Softclix Lancets lancets Use as instructed to check blood sugar three times daily. E11.69 04/21/20   Charlott Rakes, MD  albuterol (VENTOLIN HFA) 108 (90 Base) MCG/ACT inhaler Inhale 2 puffs into the lungs every 4 (four) hours as needed for wheezing or shortness of breath.  08/15/19   [provider]  Alcohol Swabs (B-D SINGLE USE SWABS REGULAR) PADS 1 each by Other route daily. 02/19/19   [provider]  amiodarone (PACERONE) 200 MG tablet Take 1 tablet (200 mg total) by mouth daily. 01/25/21   Lyda Jester M, PA-C  apixaban (ELIQUIS) 5 MG TABS  tablet Take 1 tablet (5 mg total) by mouth 2 (two) times daily. 11/09/20   Buford Dresser, MD  B-D ULTRAFINE III SHORT PEN 31G X 8 MM MISC 1 each by Other route in the morning, at noon, in the evening, and at bedtime. 11/18/20   [provider]  Blood Glucose Monitoring Suppl (ACCU-CHEK GUIDE ME) w/Device KIT Use as instructed to check blood sugar three times daily. E11.69 04/21/20   Charlott Rakes, MD  Blood Glucose Monitoring Suppl (ONETOUCH VERIO) w/Device KIT Use to check blood sugar TID. E11.69 02/04/21   Charlott Rakes, MD  DULoxetine (CYMBALTA) 60 MG capsule Take 1 capsule (60 mg total) by mouth daily. 09/24/20   [name], NP  EPINEPHrine 0.3 mg/0.3 mL IJ SOAJ injection Inject 0.3 mg into the muscle as needed for anaphylaxis. 06/25/20   [name], NP  glucose blood (ONETOUCH VERIO) test strip Use to check blood sugar TID. E11.69 02/04/21   Charlott Rakes, MD  insulin aspart (NOVOLOG) 100 UNIT/ML injection Inject 12 Units into the skin 3 (three) times daily before meals. For blood  sugars 0-199 give 0 units of insulin, 201-250 give 4 units, 251-300 give 6 units, 301-350 give 8 units, 351-400 give 10 units,> 400 give 12 units and call M.D. 06/24/20   [name], NP  LEVEMIR 100 UNIT/ML injection INJECT 0.4 MLS (40 UNITS TOTAL) INTO THE SKIN 2 (TWO) TIMES DAILY. 12/01/20   [name], NP  metoprolol succinate (TOPROL-XL) 25 MG 24 hr tablet Take 1 tablet (25 mg total) by mouth daily. 02/02/21 03/04/21  Alma Friendly, MD  Multiple Vitamins-Minerals (MULTIVITAMIN WOMEN PO) Take 1 tablet by mouth daily.     [provider]  nitroGLYCERIN (NITROSTAT) 0.4 MG SL tablet Place 1 tablet (0.4 mg total) under the tongue every 5 (five) minutes x 3 doses as needed for chest pain. 01/08/14   Charolette Forward, MD  OneTouch Delica Lancets 32P MISC Use to check blood sugar TID.  E11.69 02/04/21   Charlott Rakes, MD  oxyCODONE (OXY IR/ROXICODONE) 5 MG immediate release tablet Take 1 tablet (5 mg total) by mouth every 4 (four) hours as needed for severe pain. 01/12/21   Barrett, Erin R, PA-C  pantoprazole (PROTONIX) 40 MG tablet Take 40 mg by mouth 2 (two) times daily. 12/27/20   [provider]  Potassium Chloride ER 20 MEQ TBCR Take 20 mEq by mouth daily. 11/05/20   Baldwin Jamaica, PA-C  RESTASIS 0.05 % ophthalmic emulsion Place 1 drop into both eyes 2 (two) times daily. 11/19/17   [provider]  rosuvastatin (CRESTOR) 10 MG tablet Take 1 tablet (10 mg total) by mouth daily. 09/24/20   [name], NP  torsemide (DEMADEX) 20 MG tablet Take 2 tablets (40 mg total) by mouth daily. 02/03/21   Alma Friendly, MD  TRULICITY 1.5 QD/8.2ME SOPN INJECT 1.5 MG INTO THE SKIN ONCE A WEEK. 12/05/20   [name], NP    Allergies    Sulfa antibiotics  Review of Systems   Review of Systems 10 systems reviewed and negative except as per HPI Physical Exam Updated Vital Signs BP 115/68   Pulse 98   Temp (!) 97.3 F (36.3 C) (Oral)   Resp 19    Ht $R'5\' 7"'xT$  (1.702 m)   Wt 117.1 kg   SpO2 100%   BMI 40.43 kg/m   Physical Exam Constitutional:      Appearance: Normal appearance.  HENT:  Mouth/Throat:     Pharynx: Oropharynx is clear.  Eyes:     Extraocular Movements: Extraocular movements intact.  Cardiovascular:     Comments: Tachycardia.  Irregularly irregular. Pulmonary:     Effort: Pulmonary effort is normal.     Breath sounds: Normal breath sounds.  Abdominal:     General: There is no distension.     Palpations: Abdomen is soft.     Tenderness: There is no abdominal tenderness. There is no guarding.  Musculoskeletal:        General: No swelling or tenderness. Normal range of motion.     Right lower leg: No edema.     Left lower leg: No edema.  Skin:    General: Skin is warm and dry.  Neurological:     General: No focal deficit present.     Mental Status: She is alert and oriented to person, place, and time.     Coordination: Coordination normal.    ED Results / Procedures / Treatments   Labs (all labs ordered are listed, but only abnormal results are displayed) Labs Reviewed  BASIC METABOLIC PANEL - Abnormal; Notable for the following components:      Result Value   Potassium 2.7 (*)    Chloride 93 (*)    Glucose, Bld 165 (*)    BUN 55 (*)    Creatinine, Ser 3.10 (*)    GFR, Estimated 16 (*)    All other components within normal limits  CBC - Abnormal; Notable for the following components:   Hemoglobin 11.7 (*)    All other components within normal limits  TROPONIN I (HIGH SENSITIVITY)  TROPONIN I (HIGH SENSITIVITY)    EKG EKG Interpretation  Date/Time:  Monday February 07 2021 14:38:29 EDT Ventricular Rate:  114 PR Interval:    QRS Duration: 114 QT Interval:  420 QTC Calculation: 589 R Axis:   230 Text Interpretation: Atrial flutter Incomplete right bundle branch block atrial flutter/fib new since last racing Confirmed by Charlesetta Shanks [phone]) on 02/07/2021 4:27:14 PM  Radiology DG Chest 2  View  Result Date: 02/07/2021 CLINICAL DATA:  Chest pain EXAM: CHEST - 2 VIEW COMPARISON:  01/29/2021 FINDINGS: Post sternotomy changes with mitral and tricuspid valve repair and left atrial appendage clipping. Cardiomegaly with aortic atherosclerosis. No focal opacity, pleural effusion, or pneumothorax. IMPRESSION: No active cardiopulmonary disease.  Cardiomegaly. Electronically Signed   By: Donavan Foil M.D.   On: 02/07/2021 15:35    Procedures Procedures   Medications Ordered in ED Medications  potassium chloride 10 mEq in 100 mL IVPB (10 mEq Intravenous New Bag/Given 02/07/21 1634)  metoprolol tartrate (LOPRESSOR) injection 5 mg (has no administration in time range)  potassium chloride SA (KLOR-CON) CR tablet 40 mEq (40 mEq Oral Given 02/07/21 1626)    ED Course  I have reviewed the triage vital signs and the nursing notes.  Pertinent labs & imaging results that were available during my care of the patient were reviewed by me and considered in my medical decision making (see chart for details).    MDM Rules/Calculators/A&P                          Patient presents with general constitutional symptoms of fatigue and nausea.  She is hypokalemic which had previously occurred as well.  Patient does have atrial fibrillation which seems to be symptomatic though today patient does not show signs of being in acute congestive heart failure.  She does get periods of feeling very lightheaded and near syncopal.  Patient's mental status is good.  No focal neurologic symptoms.  At this time plan will be for admission for recurrent hypokalemia and AKI in the setting of recent mitral valve replacement. Final Clinical Impression(s) / ED Diagnoses Final diagnoses:  Hypokalemia  Nonspecific chest pain  AKI (acute kidney injury) [facility])    Rx / [place] Orders ED Discharge Orders     None        Charlesetta Shanks, MD 02/07/21 1701

## 2021-02-07 NOTE — H&P (Signed)
History and Physical    [name] ZES:[id number] DOB: 05/09/1956 DOA: 02/07/2021  Referring MD/NP/PA: Charlesetta Shanks, MD PCP: [name], NP  Patient coming from: Home via EMS  Chief Complaint: Chest pain  I have personally briefly reviewed patient's old medical records in [place]   HPI: [name] is a 65 y.o. female with medical history significant of chronic atrial fibrillation, CKD, combined systolic and diastolic heart failure, OSA, recent mitral along with tricuspid valve repair with maze procedure by Dr. Roxan Hockey in 12/2020 presents with complaints of substernal chest pain.  Pain waxes and wanes in intensity and radiates down her right arm.  Denies any change in chest pain symptoms with movement.  Reported associated symptoms of nausea, vomiting, abdominal pain, and lightheadedness.  Patient had just recently been hospitalized from 6/11-6/15 for chest pain and was noted to have acute exacerbation of combined systolic and diastolic congestive heart failure with acute kidney injury superimposed on chronic kidney disease.  During hospitalization.  Patient underwent cardioversion and was reported to be in sinus rhythm upon discharge.  During the hospitalization diuretics were held after right heart cath showed low filling pressures.  She is followed by cardiology and nephrology hospital stay, and she was started back on torsemide 40 mg daily which she reports taking as advised.  Despite doing this she reports that she has had worsening lower extremity swelling and weight gain of 1 to 2 pounds.  At home patient was not able to try anything for her symptoms and asked her family to call EMS.  In route with EMS patient was noted be in atrial fibrillation at 120-130bpm.  ED Course: Upon admission to the emergency department patient was seen to be afebrile with heart rates elevated up to 115, and all other vital signs maintained.  Labs significant for hemoglobin  11.7, potassium 2.7, BUN 55, creatinine 3.1, high-sensitivity troponin 11.  Chest x-ray noted cardiomegaly without any acute abnormality.  Patient was given metoprolol 5 mg IV x1 dose, potassium chloride 40 mEq p.o., and 10 mEq IV.  Review of Systems  Constitutional:  Positive for malaise/fatigue. Negative for fever.  HENT:  Negative for congestion and nosebleeds.   Eyes:  Negative for photophobia and pain.  Respiratory:  Negative for cough and shortness of breath.   Cardiovascular:  Positive for chest pain, palpitations and leg swelling.  Gastrointestinal:  Positive for abdominal pain, nausea and vomiting.  Genitourinary:  Negative for dysuria and hematuria.  Musculoskeletal:  Negative for joint pain and myalgias.  Neurological:  Negative for focal weakness and loss of consciousness.  Psychiatric/Behavioral:  Negative for substance abuse. The patient has insomnia.    Past Medical History:  Diagnosis Date   Allergic rhinitis    Arthritis    Asthma    Chest pain 12/27/2017   Chronic diastolic CHF (congestive heart failure) (HCC)    COPD (chronic obstructive pulmonary disease) (HCC)    Depression    DM (diabetes mellitus) ([place])    DVT (deep vein thrombosis) in pregnancy    Dysrhythmia    atrial fibrilation   GERD (gastroesophageal reflux disease)    Headache(784.0)    HTN (hypertension)    Hyperlipidemia    Obesity    Pneumonia 04/2018   RIGHT LOBE   Shortness of breath    Sleep apnea    compliant with CPAP    Past Surgical History:  Procedure Laterality Date   ABDOMINAL HYSTERECTOMY     BUBBLE STUDY  11/26/2020  Procedure: BUBBLE STUDY;  Surgeon: Buford Dresser, MD;  Location: [place];  Service: Cardiovascular;;   CARDIOVERSION N/A 05/06/2020   Procedure: CARDIOVERSION;  Surgeon: Buford Dresser, MD;  Location: [place];  Service: Cardiovascular;  Laterality: N/A;   CARDIOVERSION N/A 11/04/2020   Procedure: CARDIOVERSION;  Surgeon: Lelon Perla,  MD;  Location: [facility] ENDOSCOPY;  Service: Cardiovascular;  Laterality: N/A;   CARDIOVERSION N/A 02/01/2021   Procedure: CARDIOVERSION;  Surgeon: Jolaine Artist, MD;  Location: [facility] ENDOSCOPY;  Service: Cardiovascular;  Laterality: N/A;   CATARACT EXTRACTION, BILATERAL     CHOLECYSTECTOMY     COLONOSCOPY WITH PROPOFOL N/A 03/05/2015   Procedure: COLONOSCOPY WITH PROPOFOL;  Surgeon: Carol Ada, MD;  Location: [facility] ENDOSCOPY;  Service: Endoscopy;  Laterality: N/A;   DIAGNOSTIC LAPAROSCOPY     ingrown hallux Left    KNEE SURGERY     LEFT HEART CATH AND CORONARY ANGIOGRAPHY N/A 12/31/2017   Procedure: LEFT HEART CATH AND CORONARY ANGIOGRAPHY;  Surgeon: Charolette Forward, MD;  Location: [place] [facility];  Service: Cardiovascular;  Laterality: N/A;   MAZE N/A 12/29/2020   Procedure: MAZE;  Surgeon: [name], MD;  Location: [place];  Service: Open Heart Surgery;  Laterality: N/A;   MITRAL VALVE REPAIR N/A 12/29/2020   Procedure: MITRAL VALVE REPAIR USING CARBOMEDICS ANNULOFLEX RING SIZE 30;  Surgeon: [name], MD;  Location: [place];  Service: Open Heart Surgery;  Laterality: N/A;   RIGHT HEART CATH N/A 12/22/2020   Procedure: RIGHT HEART CATH;  Surgeon: Larey Dresser, MD;  Location: [place] [facility];  Service: Cardiovascular;  Laterality: N/A;   RIGHT HEART CATH N/A 01/31/2021   Procedure: RIGHT HEART CATH;  Surgeon: Jolaine Artist, MD;  Location: [place] [facility];  Service: Cardiovascular;  Laterality: N/A;   TEE WITHOUT CARDIOVERSION N/A 11/26/2020   Procedure: TRANSESOPHAGEAL ECHOCARDIOGRAM (TEE);  Surgeon: Buford Dresser, MD;  Location: [facility] ENDOSCOPY;  Service: Cardiovascular;  Laterality: N/A;   TEE WITHOUT CARDIOVERSION N/A 12/29/2020   Procedure: TRANSESOPHAGEAL ECHOCARDIOGRAM (TEE);  Surgeon: [name], MD;  Location: [place];  Service: Open Heart Surgery;  Laterality: N/A;   TRICUSPID VALVE REPLACEMENT N/A 12/29/2020   Procedure: TRICUSPID VALVE REPAIR  WITH EDWARDS MC3 TRICUSPID RING SIZE 34;  Surgeon: [name], MD;  Location: [place];  Service: Open Heart Surgery;  Laterality: N/A;     reports that she has never smoked. She has never used smokeless tobacco. She reports that she does not drink alcohol and does not use drugs.  Allergies  Allergen Reactions   Sulfa Antibiotics Itching    Family History  Problem Relation Age of Onset   Cancer Mother    Hypertension Mother    Cancer Father    CAD Other    Hypertension Sister    Hypertension Brother     Prior to Admission medications   Medication Sig Start Date End Date Taking? Authorizing Provider  Accu-Chek Softclix Lancets lancets Use as instructed to check blood sugar three times daily. E11.69 04/21/20   Charlott Rakes, MD  albuterol (VENTOLIN HFA) 108 (90 Base) MCG/ACT inhaler Inhale 2 puffs into the lungs every 4 (four) hours as needed for wheezing or shortness of breath.  08/15/19   [provider]  Alcohol Swabs (B-D SINGLE USE SWABS REGULAR) PADS 1 each by Other route daily. 02/19/19   [provider]  amiodarone (PACERONE) 200 MG tablet Take 1 tablet (200 mg total) by mouth daily. 01/25/21  Rosita Fire, Brittainy M, PA-C  apixaban (ELIQUIS) 5 MG TABS tablet Take 1 tablet (5 mg total) by mouth 2 (two) times daily. 11/09/20   Buford Dresser, MD  B-D ULTRAFINE III SHORT PEN 31G X 8 MM MISC 1 each by Other route in the morning, at noon, in the evening, and at bedtime. 11/18/20   [provider]  Blood Glucose Monitoring Suppl (ACCU-CHEK GUIDE ME) w/Device KIT Use as instructed to check blood sugar three times daily. E11.69 04/21/20   Charlott Rakes, MD  Blood Glucose Monitoring Suppl (ONETOUCH VERIO) w/Device KIT Use to check blood sugar TID. E11.69 02/04/21   Charlott Rakes, MD  DULoxetine (CYMBALTA) 60 MG capsule Take 1 capsule (60 mg total) by mouth daily. 09/24/20   [name], NP  EPINEPHrine 0.3 mg/0.3 mL IJ SOAJ injection Inject 0.3  mg into the muscle as needed for anaphylaxis. 06/25/20   [name], NP  glucose blood (ONETOUCH VERIO) test strip Use to check blood sugar TID. E11.69 02/04/21   Charlott Rakes, MD  insulin aspart (NOVOLOG) 100 UNIT/ML injection Inject 12 Units into the skin 3 (three) times daily before meals. For blood sugars 0-199 give 0 units of insulin, 201-250 give 4 units, 251-300 give 6 units, 301-350 give 8 units, 351-400 give 10 units,> 400 give 12 units and call M.D. 06/24/20   [name], NP  LEVEMIR 100 UNIT/ML injection INJECT 0.4 MLS (40 UNITS TOTAL) INTO THE SKIN 2 (TWO) TIMES DAILY. 12/01/20   [name], NP  metoprolol succinate (TOPROL-XL) 25 MG 24 hr tablet Take 1 tablet (25 mg total) by mouth daily. 02/02/21 03/04/21  Alma Friendly, MD  Multiple Vitamins-Minerals (MULTIVITAMIN WOMEN PO) Take 1 tablet by mouth daily.     [provider]  nitroGLYCERIN (NITROSTAT) 0.4 MG SL tablet Place 1 tablet (0.4 mg total) under the tongue every 5 (five) minutes x 3 doses as needed for chest pain. 01/08/14   Charolette Forward, MD  OneTouch Delica Lancets 29V MISC Use to check blood sugar TID.  E11.69 02/04/21   Charlott Rakes, MD  oxyCODONE (OXY IR/ROXICODONE) 5 MG immediate release tablet Take 1 tablet (5 mg total) by mouth every 4 (four) hours as needed for severe pain. 01/12/21   Barrett, Erin R, PA-C  pantoprazole (PROTONIX) 40 MG tablet Take 40 mg by mouth 2 (two) times daily. 12/27/20   [provider]  Potassium Chloride ER 20 MEQ TBCR Take 20 mEq by mouth daily. 11/05/20   Baldwin Jamaica, PA-C  RESTASIS 0.05 % ophthalmic emulsion Place 1 drop into both eyes 2 (two) times daily. 11/19/17   [provider]  rosuvastatin (CRESTOR) 10 MG tablet Take 1 tablet (10 mg total) by mouth daily. 09/24/20   [name], NP  torsemide (DEMADEX) 20 MG tablet Take 2 tablets (40 mg total) by mouth daily. 02/03/21   Alma Friendly, MD  TRULICITY 1.5 FM/7.3UY SOPN  INJECT 1.5 MG INTO THE SKIN ONCE A WEEK. 12/05/20   [name], NP    Physical Exam:  Constitutional: Older female who appears to be in some discomfort Vitals:   02/07/21 1600 02/07/21 1615 02/07/21 1630 02/07/21 1635  BP:  117/86  115/68  Pulse: (!) 101 (!) 103 (!) 47 98  Resp: 17 (!) 21 (!) 26 19  Temp:      TempSrc:      SpO2: 100% 100% 98% 100%  Weight:      Height:  Eyes: PERRL, lids and conjunctivae normal ENMT: Mucous membranes are moist. Posterior pharynx clear of any exudate or lesions.  Neck: normal, supple, no masses, no thyromegaly.  No JVD appreciated. Respiratory: clear to auscultation bilaterally, no wheezing, no crackles. Normal respiratory effort. No accessory muscle use.  Cardiovascular: Irregular irregular with tenderness palpation of the chest wall. Abdomen: no tenderness, no masses palpated. No hepatosplenomegaly. Bowel sounds positive.  Musculoskeletal: no clubbing / cyanosis. No joint deformity upper and lower extremities. Good ROM, no contractures. Normal muscle tone.  Skin: no rashes, lesions, ulcers. No induration Neurologic: CN 2-12 grossly intact. Sensation intact, DTR normal. Strength 5/5 in all 4.  Psychiatric: Normal judgment and insight. Alert and oriented x 3.  Anxious mood.     Labs on Admission: I have personally reviewed following labs and imaging studies  CBC: Recent Labs  Lab 02/02/21 0318 02/07/21 1440  WBC 7.2 6.2  NEUTROABS 3.5  --   HGB 10.8* 11.7*  HCT 34.6* 37.4  MCV 88.0 86.2  PLT 245 203   Basic Metabolic Panel: Recent Labs  Lab 02/01/21 0211 02/02/21 0318 02/07/21 1440  NA 135 139 135  K 2.9* 3.6 2.7*  CL 93* 100 93*  CO2 32 27 31  GLUCOSE 110* 86 165*  BUN 59* 54* 55*  CREATININE 3.15* 2.70* 3.10*  CALCIUM 9.6 9.6 9.4  MG 2.0 2.0  --    GFR: Estimated Creatinine Clearance: 24.3 mL/min (A) (by C-G formula based on SCr of 3.1 mg/dL (H)). Liver Function Tests: No results for input(s): AST, ALT,  ALKPHOS, BILITOT, PROT, ALBUMIN in the last 168 hours. No results for input(s): LIPASE, AMYLASE in the last 168 hours. No results for input(s): AMMONIA in the last 168 hours. Coagulation Profile: Recent Labs  Lab 02/01/21 0211  INR 2.0*   Cardiac Enzymes: No results for input(s): CKTOTAL, CKMB, CKMBINDEX, TROPONINI in the last 168 hours. BNP (last 3 results) No results for input(s): PROBNP in the last 8760 hours. HbA1C: No results for input(s): HGBA1C in the last 72 hours. CBG: Recent Labs  Lab 02/01/21 1517 02/01/21 2156 02/02/21 0534 02/02/21 1138 02/02/21 1626  GLUCAP 184* 146* 81 118* 148*   Lipid Profile: No results for input(s): CHOL, HDL, LDLCALC, TRIG, CHOLHDL, LDLDIRECT in the last 72 hours. Thyroid Function Tests: No results for input(s): TSH, T4TOTAL, FREET4, T3FREE, THYROIDAB in the last 72 hours. Anemia Panel: No results for input(s): VITAMINB12, FOLATE, FERRITIN, TIBC, IRON, RETICCTPCT in the last 72 hours. Urine analysis:    Component Value Date/Time   COLORURINE YELLOW 12/28/2020 [place] 12/28/2020 1045   LABSPEC 1.009 12/28/2020 1045   PHURINE 5.0 12/28/2020 1045   GLUCOSEU NEGATIVE 12/28/2020 1045   HGBUR SMALL (A) 12/28/2020 [place] 12/28/2020 1045   BILIRUBINUR negative 09/24/2019 1742   KETONESUR NEGATIVE 12/28/2020 1045   PROTEINUR 30 (A) 12/28/2020 1045   UROBILINOGEN 1.0 09/24/2019 1742   UROBILINOGEN 1.0 10/26/2014 0249   NITRITE NEGATIVE 12/28/2020 1045   LEUKOCYTESUR NEGATIVE 12/28/2020 1045   Sepsis Labs: Recent Results (from the past 240 hour(s))  Resp Panel by RT-PCR (Flu A&B, Covid) Nasopharyngeal Swab     Status: None   Collection Time: 01/29/21 11:12 AM   Specimen: Nasopharyngeal Swab; Nasopharyngeal(NP) swabs in vial transport medium  Result Value Ref Range Status   SARS Coronavirus 2 by RT PCR NEGATIVE NEGATIVE Final    Comment: (NOTE) SARS-CoV-2 target nucleic acids are NOT  DETECTED.  The SARS-CoV-2 RNA is  generally detectable in upper respiratory specimens during the acute phase of infection. The lowest concentration of SARS-CoV-2 viral copies this assay can detect is 138 copies/mL. A negative result does not preclude SARS-Cov-2 infection and should not be used as the sole basis for treatment or other patient management decisions. A negative result may occur with  improper specimen collection/handling, submission of specimen other than nasopharyngeal swab, presence of viral mutation(s) within the areas targeted by this assay, and inadequate number of viral copies(<138 copies/mL). A negative result must be combined with clinical observations, patient history, and epidemiological information. The expected result is Negative.  Fact Sheet for Patients:  EntrepreneurPulse.com.au  Fact Sheet for Healthcare Providers:  IncredibleEmployment.be  This test is no t yet approved or cleared by the [place] FDA and  has been authorized for detection and/or diagnosis of SARS-CoV-2 by FDA under an Emergency Use Authorization (EUA). This EUA will remain  in effect (meaning this test can be used) for the duration of the COVID-19 declaration under Section 564(b)(1) of the Act, 21 U.S.C.section 360bbb-3(b)(1), unless the authorization is terminated  or revoked sooner.       Influenza A by PCR NEGATIVE NEGATIVE Final   Influenza B by PCR NEGATIVE NEGATIVE Final    Comment: (NOTE) The Xpert Xpress SARS-CoV-2/FLU/RSV plus assay is intended as an aid in the diagnosis of influenza from Nasopharyngeal swab specimens and should not be used as a sole basis for treatment. Nasal washings and aspirates are unacceptable for Xpert Xpress SARS-CoV-2/FLU/RSV testing.  Fact Sheet for Patients: EntrepreneurPulse.com.au  Fact Sheet for Healthcare Providers: IncredibleEmployment.be  This test is not yet  approved or cleared by the [place] FDA and has been authorized for detection and/or diagnosis of SARS-CoV-2 by FDA under an Emergency Use Authorization (EUA). This EUA will remain in effect (meaning this test can be used) for the duration of the COVID-19 declaration under Section 564(b)(1) of the Act, 21 U.S.C. section 360bbb-3(b)(1), unless the authorization is terminated or revoked.  Performed at [facility], [place] [address], [place], [place] [address]      Radiological Exams on Admission: DG Chest 2 View  Result Date: 02/07/2021 CLINICAL DATA:  Chest pain EXAM: CHEST - 2 VIEW COMPARISON:  01/29/2021 FINDINGS: Post sternotomy changes with mitral and tricuspid valve repair and left atrial appendage clipping. Cardiomegaly with aortic atherosclerosis. No focal opacity, pleural effusion, or pneumothorax. IMPRESSION: No active cardiopulmonary disease.  Cardiomegaly. Electronically Signed   By: Donavan Foil M.D.   On: 02/07/2021 15:35    EKG: Independently reviewed.  Atrial fibrillation at 114 bpm  Assessment/Plan Atypical chest pain: Acute.  Patient presents with complaints of chest pain she describes a sharp pain rating down her right arm.  Initial high-sensitivity troponin was negative.  On physical exam patient with tenderness palpation of the chest wall suspect related with possible chest wall pain and/or patient going back into atrial fibrillation. -Admit to a cardiac telemetry bed -Trend cardiac troponin -Continue oxycodone as needed for pain -Cardiology consulted, we will follow-up for any further recommendation  Hypokalemia: Acute on chronic.  On admission potassium 2.7.  Patient has been given 40 mEq of potassium chloride p.o. and 10 mEq IV.  Likely related with recent reports of nausea and vomiting along with Torsemide. -Give additional 40 mEq p.o. -Check magnesium level -Recheck potassium levels in a.m. and replace as needed  Acute kidney injury superimposed on  chronic kidney disease 3B: On admission creatinine elevated up to 3.1 and BUN  55.  At discharge patient creatinine had been down to 2.7 on 6/15, but she had been restarted on torsemide 40 mg daily. -Check urine creatinine, sodium, and urea to calculate FeNA and FeUr -Hold nephrotoxic agents  Paroxysmal atrial fibrillation on chronic anticoagulation: Patient presents back in atrial fibrillation with heart rates into the 120-130s per EMS.  She had underwent cardioversion on 6/14 with cardiology and was initially noted to be in sinus rhythm.  CHA2DS2-VASc score = 4.  Home medications include amiodarone 200 mg daily and Eliquis 5 mg twice daily.. -Continue amiodarone, metoprolol and Eliquis -Goal potassium at least 4 and magnesium at least 2  Nausea and vomiting: Acute.  Ports having several episodes of nausea and vomiting.  Emesis was reported to be nonbloody. -Antiemetics as needed  Diastolic congestive heart failure: Chronic on admission patient does not appear grossly fluid overloaded, but reports weight gain of 1 to 2 pounds and worsening leg swelling.  Last EF was reported to be 45 to 50% by transesophageal echocardiogram on 11/2020.  Repeat echocardiogram from 12/2020 revealed LVEF of 60 to 65%.  Patient underwent right heart cath on 6/13 which showed low filling pressures for which diuretics were stopped. -Strict I&O's and daily weight -Add on BNP  Diabetes mellitus type 2: Last hemoglobin A1c was 7.4 on 01/29/2021.  Patient presents with glucose 165.  Home regimen appears to include Levemir 40 units twice daily with meals and NovoLog 5 units 3 times daily with meals. -Hypoglycemic protocol -Reduce home Levemir to 30 units twice daily to start tomorrow morning -CBGs before every meal with moderate sliding scale insulin -Adjust insulin regimen as needed  Essential hypertension: Blood pressures currently stable. -Continue metoprolol  History of mitral valve and tricuspid repair: Patient  underwent repair with maze procedure on 12/2020 by Dr. Roxan Hockey.  Hyperlipidemia -Continue Crestor  GERD -Continue Protonix  Morbid obesity: BMI 40.43kg/m  OSA: Patient reports that she does not have a CPAP machine at home and utilizes oxygen as needed.  During her last hospital stay did not tolerate the CPAP machine as it made her feel like she was drowning.  DVT prophylaxis: Eliquis Code Status: Full Family Communication: Granddaughter updated at bedside Disposition Plan: Hopefully discharge home once medically stable Consults called: Cardiology Admission status: Inpatient  Norval Morton MD [facility]   If 7PM-7AM, please contact night-coverage   02/07/2021, 4:45 PM

## 2021-02-07 NOTE — Consult Note (Addendum)
Cardiology Consultation:   Patient ID: [name]; [id number]; 01-18-1956   Admit date: 02/07/2021 Date of Consult: 02/07/2021  Primary Care Provider: [name], NP Primary Cardiologist: Dr. [name], MD/Dr. Harrell Gave   Patient Profile:   [name] is a 65 y.o. female with a hx of morbid obesity, chronic diastolic and systolic HF with mildly reduced LVEF (45-50%), severe MR/TR, difficult to control atrial fibrillation on eliquis, HTN, and DM2 who is being seen today for the evaluation of chest pain at the request of Dr. Tamala Julian.  History of Present Illness:   [name] is a 65yo F with a hx as stated above who presented to [facility] with right sided chest pain with radiation to her right shoulder. Pain began approximately 2 days ago. She has no SOB, orthopnea or LE edema. In the ED, potassium was found to be 2.7. Creatinine elevated again at 3.1. High-sensitivity troponin was 11. CXR with no acute abnormality.  She was given 5 mg IV metoprolol and potassium was replaced with 40 mill equivalent p.o. along with 10 mill equivalent IV.  [name] is followed by Dr. [name], most recently seen in hospital consultation 01/31/2021.  She was historically followed by Dr. Harrell Gave as an outpatient.  She has a history of heart failure with mildly reduced LVEF at 45 to 50% with cath in 2019 showing mild nonobstructive CAD. Also has a history of atrial fibrillation which has been difficult to control.  She has failed Tikosyn which was later switched to amiodarone, digoxin along with metoprolol and apixaban. She was seen by Dr. Harrell Gave 11/2020 when she was experiencing shortness of breath, weight gain and lower extremity edema with chest pain.  At that office visit, patient declined to go to the ER.  She was changed from Lasix to torsemide and was planned for outpatient surgery evaluation for her severe MR.  She was later admitted 12/19/2020 with worsening heart  failure.  Cath showed elevated filling pressures and low cardiac index however no obstructive CAD.  She underwent MV/TV repair with maze on 12/30/2020. Her postoperative course was complicated with low output, progressive renal failure and volume overload.  Advanced heart failure team was consulted to assist with management and she was placed on milrinone along with Lasix infusions.  Postoperative echocardiogram showed an LVEF at 55% with moderately enlarged RV which was improved on milrinone therapy.  Nephrology was consulted to assist with transition from IV to p.o. diuretics.  She was eventually discharged 01/15/2021 with a discharge weight at 281.   She was seen in follow-up on 01/25/2021 with advanced heart failure clinic at which time she noted feeling terrible with multiple complaints.  She remained in atrial fibrillation with a heart rate at 100 bpm on EKG.  Reds vest clip elevated at 42%.  She was instructed to increase her torsemide to 80 mg twice daily then resume 80 mg once daily.  There was question whether her anemia was contributing to her nausea and vomiting therefore she was instructed to reduce to 200 mg daily.  She then represented to the ED on 01/29/2021 with worsening shortness of breath and chest pain.  CXR with no overt edema.  EKG with atrial fibrillation with a rate at 105 bpm.  Advanced heart failure was reconsulted once again at which time her diuretics were held due to worsening renal function. Team felt she was nearing HD.  There was felt to be no role for inotropes.  Plan was for DCCV to attempt  restoration of NSR.  Cardioversion performed 02/01/2021 with a single biphasic synchronized 200 J shock.  Creatinine began to improve by day of discharge to the 2.7 range.  She was continued on amiodarone 200 mg daily along with Eliquis 5 mg twice daily, torsemide 40 mg daily to start 02/03/2021, Crestor 10, Toprol 25 and K. Dur 20 daily.  Past Surgical History:  Procedure Laterality Date    ABDOMINAL HYSTERECTOMY     BUBBLE STUDY  11/26/2020   Procedure: BUBBLE STUDY;  Surgeon: Buford Dresser, MD;  Location: [place];  Service: Cardiovascular;;   CARDIOVERSION N/A 05/06/2020   Procedure: CARDIOVERSION;  Surgeon: Buford Dresser, MD;  Location: [place];  Service: Cardiovascular;  Laterality: N/A;   CARDIOVERSION N/A 11/04/2020   Procedure: CARDIOVERSION;  Surgeon: Lelon Perla, MD;  Location: [facility] ENDOSCOPY;  Service: Cardiovascular;  Laterality: N/A;   CARDIOVERSION N/A 02/01/2021   Procedure: CARDIOVERSION;  Surgeon: Jolaine Artist, MD;  Location: [facility] ENDOSCOPY;  Service: Cardiovascular;  Laterality: N/A;   CATARACT EXTRACTION, BILATERAL     CHOLECYSTECTOMY     COLONOSCOPY WITH PROPOFOL N/A 03/05/2015   Procedure: COLONOSCOPY WITH PROPOFOL;  Surgeon: Carol Ada, MD;  Location: [facility] ENDOSCOPY;  Service: Endoscopy;  Laterality: N/A;   DIAGNOSTIC LAPAROSCOPY     ingrown hallux Left    KNEE SURGERY     LEFT HEART CATH AND CORONARY ANGIOGRAPHY N/A 12/31/2017   Procedure: LEFT HEART CATH AND CORONARY ANGIOGRAPHY;  Surgeon: Charolette Forward, MD;  Location: [place] CV [facility];  Service: Cardiovascular;  Laterality: N/A;   MAZE N/A 12/29/2020   Procedure: MAZE;  Surgeon: [name], MD;  Location: [place];  Service: Open Heart Surgery;  Laterality: N/A;   MITRAL VALVE REPAIR N/A 12/29/2020   Procedure: MITRAL VALVE REPAIR USING CARBOMEDICS ANNULOFLEX RING SIZE 30;  Surgeon: [name], MD;  Location: [place];  Service: Open Heart Surgery;  Laterality: N/A;   RIGHT HEART CATH N/A 12/22/2020   Procedure: RIGHT HEART CATH;  Surgeon: Larey Dresser, MD;  Location: [place] CV LAB;  Service: Cardiovascular;  Laterality: N/A;   RIGHT HEART CATH N/A 01/31/2021   Procedure: RIGHT HEART CATH;  Surgeon: Jolaine Artist, MD;  Location: [place] CV LAB;  Service: Cardiovascular;  Laterality: N/A;   TEE WITHOUT CARDIOVERSION N/A 11/26/2020   Procedure:  TRANSESOPHAGEAL ECHOCARDIOGRAM (TEE);  Surgeon: Buford Dresser, MD;  Location: [facility] ENDOSCOPY;  Service: Cardiovascular;  Laterality: N/A;   TEE WITHOUT CARDIOVERSION N/A 12/29/2020   Procedure: TRANSESOPHAGEAL ECHOCARDIOGRAM (TEE);  Surgeon: [name], MD;  Location: [place];  Service: Open Heart Surgery;  Laterality: N/A;   TRICUSPID VALVE REPLACEMENT N/A 12/29/2020   Procedure: 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 RING SIZE 34;  Surgeon: [name], MD;  Location: [place];  Service: Open Heart Surgery;  Laterality: N/A;     Prior to Admission medications   Medication Sig Start Date End Date Taking? Authorizing Provider  Accu-Chek Softclix Lancets lancets Use as instructed to check blood sugar three times daily. E11.69 04/21/20  Yes Newlin, Charlane Ferretti, MD  albuterol (VENTOLIN HFA) 108 (90 Base) MCG/ACT inhaler Inhale 2 puffs into the lungs every 4 (four) hours as needed for wheezing or shortness of breath.  08/15/19  Yes [provider]  Alcohol Swabs (B-D SINGLE USE SWABS REGULAR) PADS 1 each by Other route daily. 02/19/19  Yes [provider]  amiodarone (PACERONE) 200 MG tablet Take 1 tablet (200 mg total) by mouth  daily. 01/25/21  Yes Rosita Fire, Brittainy M, PA-C  apixaban (ELIQUIS) 5 MG TABS tablet Take 1 tablet (5 mg total) by mouth 2 (two) times daily. 11/09/20  Yes Buford Dresser, MD  B-D ULTRAFINE III SHORT PEN 31G X 8 MM MISC 1 each by Other route in the morning, at noon, in the evening, and at bedtime. 11/18/20  Yes [provider]  Blood Glucose Monitoring Suppl (ACCU-CHEK GUIDE ME) w/Device KIT Use as instructed to check blood sugar three times daily. E11.69 04/21/20  Yes Charlott Rakes, MD  Blood Glucose Monitoring Suppl (ONETOUCH VERIO) w/Device KIT Use to check blood sugar TID. E11.69 02/04/21  Yes Newlin, Enobong, MD  DULoxetine (CYMBALTA) 60 MG capsule Take 1 capsule (60 mg total) by mouth daily. 09/24/20  Yes [name], NP  EPINEPHrine 0.3 mg/0.3 mL IJ SOAJ injection Inject 0.3 mg into the muscle as needed for anaphylaxis. 06/25/20  Yes [name], NP  glucose blood (ONETOUCH VERIO) test strip Use to check blood sugar TID. E11.69 02/04/21  Yes Newlin, Enobong, MD  insulin aspart (NOVOLOG) 100 UNIT/ML injection Inject 12 Units into the skin 3 (three) times daily before meals. For blood sugars 0-199 give 0 units of insulin, 201-250 give 4 units, 251-300 give 6 units, 301-350 give 8 units, 351-400 give 10 units,> 400 give 12 units and call M.D. 06/24/20  Yes [name], NP  LEVEMIR 100 UNIT/ML injection INJECT 0.4 MLS (40 UNITS TOTAL) INTO THE SKIN 2 (TWO) TIMES DAILY. Patient taking differently: Inject 40 Units into the skin 2 (two) times daily. 12/01/20  Yes [name], NP  metoprolol succinate (TOPROL-XL) 25 MG 24 hr tablet Take 1 tablet (25 mg total) by mouth daily. 02/02/21 03/04/21 Yes Alma Friendly, MD  Multiple Vitamins-Minerals (MULTIVITAMIN WOMEN PO) Take 1 tablet by mouth daily.    Yes [provider]  nitroGLYCERIN (NITROSTAT) 0.4 MG SL tablet Place 1 tablet (0.4 mg total) under the tongue every 5 (five) minutes x 3 doses as needed for chest pain. 01/08/14  Yes Charolette Forward, MD  OneTouch Delica Lancets 60A MISC Use to check blood sugar TID.  E11.69 02/04/21  Yes Newlin, Enobong, MD  oxyCODONE (OXY IR/ROXICODONE) 5 MG immediate release tablet Take 1 tablet (5 mg total) by mouth every 4 (four) hours as needed for severe pain. 01/12/21  Yes Barrett, Erin R, PA-C  pantoprazole (PROTONIX) 40 MG tablet Take 40 mg by mouth 2 (two) times daily. 12/27/20  Yes [provider]  Potassium Chloride ER 20 MEQ TBCR Take 20 mEq by mouth daily. 11/05/20  Yes Baldwin Jamaica, PA-C  RESTASIS 0.05 % ophthalmic emulsion Place 1 drop into both eyes 2 (two) times daily. 11/19/17  Yes [provider]  rosuvastatin (CRESTOR) 10 MG tablet Take 1 tablet (10 mg total) by  mouth daily. 09/24/20  Yes [name], NP  torsemide (DEMADEX) 20 MG tablet Take 2 tablets (40 mg total) by mouth daily. 02/03/21  Yes Alma Friendly, MD  TRULICITY 1.5 VW/0.9WJ SOPN INJECT 1.5 MG INTO THE SKIN ONCE A WEEK. 12/05/20  Yes [name], NP    Inpatient Medications: Scheduled Meds:  [START ON 02/08/2021] amiodarone  200 mg Oral Daily   apixaban  5 mg Oral BID   cycloSPORINE  1 drop Both Eyes BID   [START ON 02/08/2021] DULoxetine  60 mg Oral Daily   [START ON 02/08/2021] insulin aspart  0-15 Units Subcutaneous TID WC   [START  ON 02/08/2021] insulin detemir  30 Units Subcutaneous BID   [START ON 02/08/2021] metoprolol succinate  25 mg Oral Daily   pantoprazole  40 mg Oral BID   [START ON 02/08/2021] Potassium Chloride ER  20 mEq Oral Daily   potassium chloride  40 mEq Oral STAT   [START ON 02/08/2021] rosuvastatin  10 mg Oral Daily   sodium chloride flush  3 mL Intravenous Q12H   Continuous Infusions:  PRN Meds: acetaminophen **OR** acetaminophen, albuterol, ondansetron **OR** ondansetron (ZOFRAN) IV, oxyCODONE  Allergies:    Allergies  Allergen Reactions   Sulfa Antibiotics Itching    Social History:   Social History   Socioeconomic History   Marital status: Significant Other    Spouse name: Not on file   Number of children: 2   Years of education: Not on file   Highest education level: Some college, no degree  Occupational History   Not on file  Tobacco Use   Smoking status: Never   Smokeless tobacco: Never  Vaping Use   Vaping Use: Never used  Substance and Sexual Activity   Alcohol use: No   Drug use: No   Sexual activity: Not Currently  Other Topics Concern   Not on file  Social History Narrative   Pt lives in [place] with spouse.  2 grown children.   Previously worked in [employer] at [employer].  Now on disability   Social Determinants of Health   Financial Resource Strain: Medium Risk   Difficulty of Paying Living Expenses:  Somewhat hard  Food Insecurity: No Food Insecurity   Worried About Charity fundraiser in the Last Year: Never true   Ran Out of Food in the Last Year: Never true  Transportation Needs: Unmet Transportation Needs   Lack of Transportation (Medical): Yes   Lack of Transportation (Non-Medical): No  Physical Activity: Not on file  Stress: Not on file  Social Connections: Not on file  Intimate Partner Violence: Not on file    Family History:   Family History  Problem Relation Age of Onset   Cancer Mother    Hypertension Mother    Cancer Father    CAD Other    Hypertension Sister    Hypertension Brother    Family Status:  Family Status  Relation Name Status   Mother  Deceased   Father  Deceased   Other  (Not Specified)   Sister  (Not Specified)   Brother  (Not Specified)    ROS:  Please see the history of present illness.  All other ROS reviewed and negative.     Physical Exam/Data:   Vitals:   02/07/21 1615 02/07/21 1630 02/07/21 1635 02/07/21 1745  BP: 117/86  115/68 (!) 125/105  Pulse: (!) 103 (!) 47 98 93  Resp: (!) 21 (!) 26 19 (!) 23  Temp:      TempSrc:      SpO2: 100% 98% 100% 98%  Weight:      Height:        Intake/Output Summary (Last 24 hours) at 02/07/2021 1906 Last data filed at 02/07/2021 1734 Gross per 24 hour  Intake 97.36 ml  Output --  Net 97.36 ml   Filed Weights   02/07/21 1425  Weight: 117.1 kg   Body mass index is 40.43 kg/m.   General: Obese, NAD Neck: Negative for carotid bruits. No JVD Lungs: Mild crackles in lower lobes Clear to ausculation bilaterally. No wheezes, rales, or rhonchi. Breathing is unlabored. Cardiovascular:  RRR with S1 S2. No murmurs, rubs, gallops, or LV heave appreciated. Abdomen: Soft, non-tender, non-distended with normoactive bowel sounds. No hepatomegaly, No rebound/guarding. No obvious abdominal masses. MSK: Strength and tone appear normal for age. 5/5 in all extremities Extremities: No edema. No clubbing  or cyanosis. DP/PT pulses 2+ bilaterally Neuro: Alert and oriented. No focal deficits. No facial asymmetry. MAE spontaneously. Psych: Responds to questions appropriately with normal affect.    EKG:  The EKG was personally reviewed and demonstrates:  02/07/21 with AF with HR 114bpm  Telemetry:  Telemetry was personally reviewed and demonstrates:  02/07/21 AF with HR 90's   Relevant CV Studies:  Echocardiogram 01/02/21:   1. No subcostal views; Elevated mean gradient across aortic valve (mean  gradient 13 mmHg) but visually valve opens well; S/P MV repair with mean  gradient 7 mmHg and trace MR; S/P TV repair with moderate TR.   2. Left ventricular ejection fraction, by estimation, is 60 to 65%. The  left ventricle has normal function. The left ventricle has no regional  wall motion abnormalities. There is moderate left ventricular hypertrophy.  There is the interventricular  septum is flattened in systole and diastole, consistent with right  ventricular pressure and volume overload.   3. Right ventricular systolic function is normal. The right ventricular  size is mildly enlarged.   4. Left atrial size was moderately dilated.   5. Right atrial size was mildly dilated.   6. The mitral valve has been repaired/replaced. Trivial mitral valve  regurgitation. No evidence of mitral stenosis.   7. The tricuspid valve is has been repaired/replaced. Tricuspid valve  regurgitation is moderate.   8. The aortic valve is tricuspid. Aortic valve regurgitation is not  visualized. No aortic stenosis is present.   LHC 12/31/2017:  Prox LAD lesion is 10% stenosed. Dist LM to Ost LAD lesion is 10% stenosed. Ost Cx lesion is 15% stenosed. The left ventricular systolic function is normal. LV end diastolic pressure is normal.  Laboratory Data:  Chemistry Recent Labs  Lab 02/01/21 0211 02/02/21 0318 02/07/21 1440  NA 135 139 135  K 2.9* 3.6 2.7*  CL 93* 100 93*  CO2 32 27 31  GLUCOSE 110* 86  165*  BUN 59* 54* 55*  CREATININE 3.15* 2.70* 3.10*  CALCIUM 9.6 9.6 9.4  GFRNONAA 16* 19* 16*  ANIONGAP _0 Total Protein  Date Value Ref Range Status  01/29/2021 8.6 (H) 6.5 - 8.1 g/dL Final  04/21/2020 8.0 6.0 - 8.5 g/dL Final   Albumin  Date Value Ref Range Status  01/29/2021 3.5 3.5 - 5.0 g/dL Final  04/21/2020 4.0 3.8 - 4.8 g/dL Final   AST  Date Value Ref Range Status  01/29/2021 39 15 - 41 U/L Final   ALT  Date Value Ref Range Status  01/29/2021 33 0 - 44 U/L Final   Alkaline Phosphatase  Date Value Ref Range Status  01/29/2021 94 38 - 126 U/L Final   Total Bilirubin  Date Value Ref Range Status  01/29/2021 0.8 0.3 - 1.2 mg/dL Final   Bilirubin Total  Date Value Ref Range Status  04/21/2020 0.3 0.0 - 1.2 mg/dL Final   Hematology Recent Labs  Lab 02/02/21 0318 02/07/21 1440  WBC 7.2 6.2  RBC 3.93 4.34  HGB 10.8* 11.7*  HCT 34.6* 37.4  MCV 88.0 86.2  MCH 27.5 27.0  MCHC 31.2 31.3  RDW 14.3 14.0  PLT 245 270   Cardiac EnzymesNo results  for input(s): TROPONINI in the last 168 hours. No results for input(s): TROPIPOC in the last 168 hours.  BNPNo results for input(s): BNP, PROBNP in the last 168 hours.  DDimer No results for input(s): DDIMER in the last 168 hours. TSH:  Lab Results  Component Value Date   TSH 2.159 01/25/2021   Lipids: Lab Results  Component Value Date   CHOL 140 06/24/2020   HDL 46 06/24/2020   LDLCALC 75 06/24/2020   TRIG 102 06/24/2020   CHOLHDL 3.0 06/24/2020   HgbA1c: Lab Results  Component Value Date   HGBA1C 7.4 (H) 01/29/2021    Radiology/Studies:  DG Chest 2 View  Result Date: 02/07/2021 CLINICAL DATA:  Chest pain EXAM: CHEST - 2 VIEW COMPARISON:  01/29/2021 FINDINGS: Post sternotomy changes with mitral and tricuspid valve repair and left atrial appendage clipping. Cardiomegaly with aortic atherosclerosis. No focal opacity, pleural effusion, or pneumothorax. IMPRESSION: No active cardiopulmonary  disease.  Cardiomegaly. Electronically Signed   By: Donavan Foil M.D.   On: 02/07/2021 15:35    Assessment and Plan:   1.  Chest pain: -Patient presented with right sided chest pain dwith radiation down her right shoulder which is reproducible on PE>> not consistent with ACS  -HST, 11 and 12  -LHC from 12/31/2017 with nonobstructive CAD -Low suspicion for ACS chest pain etiology at this time. She was found to be in AF with rate control once again. Unclear etiology of her chest pain however I suspect some of it comes from her recent surgery along with recurrent symptomatic atrial fibrillation. -Continue current regimen -Plan at last cardiology sign off was for amiodarone 200 mg daily along with Eliquis 5 mg twice daily, torsemide 40 mg daily to start 02/03/2021, Crestor 10, Toprol 25 and K. Dur 20 daily -Will add IV amio with bolus and follow response   2.  Chronic combined systolic and diastolic CHF: -Last echocardiogram with LVEF at 60 to 65% with mildly enlarged RV, status post MV/TV repair with maze procedure 12/30/2020 -Continue with regimen as above  -Does not appear to be fluid volume overloaded on exam -No edema on CXR -Weight, 258lb  -I&O, 85m  -Would hold torsemide for now  -Trend creatinine   3.  Severe MR/TR s/p mitral valve repair and tricuspid valve repair on 12/30/2020: -Sternotomy healing however wonder if some of her chest pain is coming from her surgical site  -Post op echocardiogram with stable LVEF at 60 to 65% with mean MV gradient at 7 mmHg with trace MR, moderate TR and mild RV enlargement -Follows with surgery  4.  Persistent atrial fibrillation s/p maze procedure: -Longstanding difficult to control atrial fibrillation.  Recently underwent DCCV on 02/01/2021 for restoration of NSR -Unfortunately on ED arrival patient again in AF with relative rate control in the 90-100 range  -Continue amiodarone 200 mg, Eliquis 5 mg twice daily, Toprol 21mQD  5.  Acute on  chronic CKD stage III: -Baseline creatinine appears to be in the 1.6-1.8 range however was 3.1 on this admission -Has had recent issues with fluctuating creatinine levels -Per chart review, may be headed towards HD -Would consider nephrology consultation while inpatient  6.  DM2: -SSI for glucose control inpatient status -Management per primary team  7.  Obesity: -BMI greater than 40  8. HLD: -Continue Crestor 1074mD   9. Hypokalemia: -Replaced per IM    For questions or updates, please contact [facility] consult [email or web address] for contact info under Cardiology/STEMI.  Lyndel Safe NP-C [place] Pager: [phone] 02/07/2021 7:06 PM   Patient seen and examined.  Agree with above documentation.  [name] is a 65 year old female with a history of chronic combined systolic and diastolic heart failure (EF 45 to 50%), severe MR/TR status post mitral valve repair and tricuspid valve repair on 12/30/2020, atrial fibrillation status post maze procedure on 12/30/2020, T2DM, hypertension who we are consulted to see by Dr. Tamala Julian for evaluation of chest pain.  She underwent mitral valve repair, tricuspid valve repair, Maze procedure on 12/30/2020.  Postop course was complicated by progressive renal failure and volume overload, advanced heart failure was consulted and she required milrinone.  She was weaned off inotropes and eventually discharged on 5/28.  She was readmitted on 6/11 with worsening shortness of breath and chest pain.  EKG showed A. fib with RVR.  She underwent cardioversion on 6/14.  Creatinine was 2.7 on discharge.  She presents today with chest pain.  Reports pain is right-sided, radiates to right shoulder.  Has been occurring for the past 2 days.  She denies any shortness of breath.  In the ED, initial vital signs notable for BP 121/75, pulse 102, SPO2 97% on room air.  Labs notable for creatinine 3.1, potassium 2.7, sodium 135, hemoglobin 11.7,  platelets 270, WBC 6.2, troponins 11 >12, BNP 74, COVID 19 negative. Chest x-ray unremarkable.  EKG shows atrial fibrillation, rate 114, incomplete right bundle branch block.  On exam, patient is alert and oriented,  irregular rhythm, tachycardic, no murmurs, lungs CTAB, no LE edema or JVD.  For her chest pain, suspect likely related to recent cardiac surgery.  Troponins unremarkable, no evidence of acute coronary syndrome.  No ischemic work-up recommended. For her atrial fibrillation, rates mildly elevated.  Will start on amiodarone drip for rate control.  Would correct hypokalemia/hypomagnesemia.  Plan likely repeat cardioversion once more amiodarone is loaded and electrolytes corrected.  For her chronic combined heart failure, she appears euvolemic on exam.  Given her hypokalemia and worsening renal function, will hold diuretics.  Donato Heinz, MD

## 2021-02-07 NOTE — Telephone Encounter (Signed)
Pts sister calling to inform Dr. Harrell Gave that her sister is in the emergency room.

## 2021-02-08 ENCOUNTER — Telehealth (INDEPENDENT_AMBULATORY_CARE_PROVIDER_SITE_OTHER): Payer: Medicare Other | Admitting: Primary Care

## 2021-02-08 ENCOUNTER — Encounter (HOSPITAL_COMMUNITY): Payer: Self-pay | Admitting: Internal Medicine

## 2021-02-08 ENCOUNTER — Other Ambulatory Visit: Payer: Self-pay

## 2021-02-08 DIAGNOSIS — E1122 Type 2 diabetes mellitus with diabetic chronic kidney disease: Secondary | ICD-10-CM

## 2021-02-08 DIAGNOSIS — I4819 Other persistent atrial fibrillation: Secondary | ICD-10-CM | POA: Diagnosis not present

## 2021-02-08 DIAGNOSIS — I130 Hypertensive heart and chronic kidney disease with heart failure and stage 1 through stage 4 chronic kidney disease, or unspecified chronic kidney disease: Secondary | ICD-10-CM | POA: Diagnosis not present

## 2021-02-08 DIAGNOSIS — R0789 Other chest pain: Secondary | ICD-10-CM

## 2021-02-08 DIAGNOSIS — I5042 Chronic combined systolic (congestive) and diastolic (congestive) heart failure: Secondary | ICD-10-CM | POA: Diagnosis not present

## 2021-02-08 DIAGNOSIS — Z20822 Contact with and (suspected) exposure to covid-19: Secondary | ICD-10-CM | POA: Diagnosis not present

## 2021-02-08 DIAGNOSIS — Z794 Long term (current) use of insulin: Secondary | ICD-10-CM

## 2021-02-08 DIAGNOSIS — Z6841 Body Mass Index (BMI) 40.0 and over, adult: Secondary | ICD-10-CM | POA: Diagnosis not present

## 2021-02-08 DIAGNOSIS — G4733 Obstructive sleep apnea (adult) (pediatric): Secondary | ICD-10-CM | POA: Diagnosis present

## 2021-02-08 DIAGNOSIS — J449 Chronic obstructive pulmonary disease, unspecified: Secondary | ICD-10-CM | POA: Diagnosis present

## 2021-02-08 DIAGNOSIS — I480 Paroxysmal atrial fibrillation: Secondary | ICD-10-CM | POA: Diagnosis not present

## 2021-02-08 DIAGNOSIS — N184 Chronic kidney disease, stage 4 (severe): Secondary | ICD-10-CM | POA: Diagnosis not present

## 2021-02-08 DIAGNOSIS — R700 Elevated erythrocyte sedimentation rate: Secondary | ICD-10-CM | POA: Diagnosis present

## 2021-02-08 DIAGNOSIS — D631 Anemia in chronic kidney disease: Secondary | ICD-10-CM | POA: Diagnosis not present

## 2021-02-08 DIAGNOSIS — R112 Nausea with vomiting, unspecified: Secondary | ICD-10-CM | POA: Diagnosis present

## 2021-02-08 DIAGNOSIS — Z952 Presence of prosthetic heart valve: Secondary | ICD-10-CM | POA: Diagnosis not present

## 2021-02-08 DIAGNOSIS — N179 Acute kidney failure, unspecified: Secondary | ICD-10-CM | POA: Diagnosis not present

## 2021-02-08 DIAGNOSIS — I970 Postcardiotomy syndrome: Secondary | ICD-10-CM | POA: Diagnosis not present

## 2021-02-08 DIAGNOSIS — I484 Atypical atrial flutter: Secondary | ICD-10-CM | POA: Diagnosis not present

## 2021-02-08 DIAGNOSIS — N1832 Chronic kidney disease, stage 3b: Secondary | ICD-10-CM

## 2021-02-08 DIAGNOSIS — K219 Gastro-esophageal reflux disease without esophagitis: Secondary | ICD-10-CM | POA: Diagnosis present

## 2021-02-08 DIAGNOSIS — Z882 Allergy status to sulfonamides status: Secondary | ICD-10-CM | POA: Diagnosis not present

## 2021-02-08 DIAGNOSIS — Z86718 Personal history of other venous thrombosis and embolism: Secondary | ICD-10-CM | POA: Diagnosis not present

## 2021-02-08 DIAGNOSIS — F32A Depression, unspecified: Secondary | ICD-10-CM | POA: Diagnosis present

## 2021-02-08 DIAGNOSIS — E785 Hyperlipidemia, unspecified: Secondary | ICD-10-CM | POA: Diagnosis present

## 2021-02-08 DIAGNOSIS — E876 Hypokalemia: Secondary | ICD-10-CM | POA: Diagnosis present

## 2021-02-08 LAB — URINALYSIS, ROUTINE W REFLEX MICROSCOPIC
Bacteria, UA: NONE SEEN
Bilirubin Urine: NEGATIVE
Glucose, UA: NEGATIVE mg/dL
Ketones, ur: NEGATIVE mg/dL
Leukocytes,Ua: NEGATIVE
Nitrite: NEGATIVE
Protein, ur: NEGATIVE mg/dL
Specific Gravity, Urine: 1.013 (ref 1.005–1.030)
pH: 5 (ref 5.0–8.0)

## 2021-02-08 LAB — GLUCOSE, CAPILLARY
Glucose-Capillary: 170 mg/dL — ABNORMAL HIGH (ref 70–99)
Glucose-Capillary: 176 mg/dL — ABNORMAL HIGH (ref 70–99)
Glucose-Capillary: 160 mg/dL — ABNORMAL HIGH (ref 70–99)
Glucose-Capillary: 185 mg/dL — ABNORMAL HIGH (ref 70–99)

## 2021-02-08 LAB — CBC
HCT: 33.8 % — ABNORMAL LOW (ref 36.0–46.0)
Hemoglobin: 10.6 g/dL — ABNORMAL LOW (ref 12.0–15.0)
MCH: 26.8 pg (ref 26.0–34.0)
MCHC: 31.4 g/dL (ref 30.0–36.0)
MCV: 85.6 fL (ref 80.0–100.0)
Platelets: 251 10*3/uL (ref 150–400)
RBC: 3.95 MIL/uL (ref 3.87–5.11)
RDW: 14.1 % (ref 11.5–15.5)
WBC: 6.3 10*3/uL (ref 4.0–10.5)
nRBC: 0 % (ref 0.0–0.2)

## 2021-02-08 LAB — COMPREHENSIVE METABOLIC PANEL
ALT: 31 U/L (ref 0–44)
AST: 30 U/L (ref 15–41)
Albumin: 3.1 g/dL — ABNORMAL LOW (ref 3.5–5.0)
Alkaline Phosphatase: 72 U/L (ref 38–126)
Anion gap: 14 (ref 5–15)
BUN: 54 mg/dL — ABNORMAL HIGH (ref 8–23)
CO2: 30 mmol/L (ref 22–32)
Calcium: 9.5 mg/dL (ref 8.9–10.3)
Chloride: 96 mmol/L — ABNORMAL LOW (ref 98–111)
Creatinine, Ser: 3.01 mg/dL — ABNORMAL HIGH (ref 0.44–1.00)
GFR, Estimated: 17 mL/min — ABNORMAL LOW (ref >60)
Glucose, Bld: 190 mg/dL — ABNORMAL HIGH (ref 70–99)
Potassium: 3.1 mmol/L — ABNORMAL LOW (ref 3.5–5.1)
Sodium: 140 mmol/L (ref 135–145)
Total Bilirubin: 0.7 mg/dL (ref 0.3–1.2)
Total Protein: 7.5 g/dL (ref 6.5–8.1)

## 2021-02-08 MED ORDER — POTASSIUM CHLORIDE CRYS ER 20 MEQ PO TBCR
40.0000 meq | EXTENDED_RELEASE_TABLET | Freq: Once | ORAL | Status: AC
  Administered 2021-02-08: 40 meq via ORAL
  Filled 2021-02-08: qty 2

## 2021-02-08 MED ORDER — POTASSIUM CHLORIDE 20 MEQ PO PACK
40.0000 meq | PACK | Freq: Once | ORAL | Status: DC
  Filled 2021-02-08: qty 2

## 2021-02-08 MED ORDER — PROCHLORPERAZINE EDISYLATE 10 MG/2ML IJ SOLN
2.5000 mg | Freq: Four times a day (QID) | INTRAMUSCULAR | Status: DC | PRN
  Administered 2021-02-08: 5 mg via INTRAVENOUS
  Filled 2021-02-08: qty 2

## 2021-02-08 MED ORDER — POTASSIUM CHLORIDE 20 MEQ PO PACK: 40.0000 meq | PACK | Freq: Every day | ORAL | Status: DC

## 2021-02-08 NOTE — Progress Notes (Signed)
Progress Note  Patient Name: [name] Date of Encounter: 02/08/2021  [facility] Cardiologist: Buford Dresser, MD   Subjective   We spent significant time today reviewing her post op symptoms. Her right sided chest pain is tender on palpation, discussed that sternal healing can take time and may cause pain. Also discussed her afib. She feels like she was starting to go back into afib over the weekend and then went back in yesterday and stayed. This makes her feel poorly. Appetite is poor, lots of fatigue. She also has significant family stress. We discussed how we can set up a plan to manage her symptoms so that if she is hemodynamically stable, we can try to manage as an outpatient.  Inpatient Medications    Scheduled Meds:  apixaban  5 mg Oral BID   cycloSPORINE  1 drop Both Eyes BID   DULoxetine  60 mg Oral Daily   insulin aspart  0-15 Units Subcutaneous TID WC   insulin detemir  30 Units Subcutaneous BID   metoprolol succinate  25 mg Oral Daily   pantoprazole  40 mg Oral BID   potassium chloride  40 mEq Oral Once   rosuvastatin  10 mg Oral Daily   sodium chloride flush  3 mL Intravenous Q12H   Continuous Infusions:  amiodarone 30 mg/hr (02/08/21 0704)   PRN Meds: acetaminophen **OR** acetaminophen, albuterol, ondansetron **OR** ondansetron (ZOFRAN) IV, oxyCODONE   Vital Signs    Vitals:   02/07/21 2105 02/08/21 0011 02/08/21 0500 02/08/21 0726  BP: 116/79 100/61 122/77 131/71  Pulse: 98 86 88 91  Resp: 20 18 19 18   Temp: 98.2 F (36.8 C) 98.3 F (36.8 C) 98 F (36.7 C) 97.8 F (36.6 C)  TempSrc: Oral Oral Oral Oral  SpO2: 97% 100% 95% 99%  Weight: 114.2 kg  114.2 kg   Height: 5\' 7"  (1.702 m)       Intake/Output Summary (Last 24 hours) at 02/08/2021 0900 Last data filed at 02/08/2021 0500 Gross per 24 hour  Intake 358.48 ml  Output 700 ml  Net -341.52 ml   Last 3 Weights 02/08/2021 02/07/2021 02/07/2021  Weight (lbs) 251 lb 11.2 oz 251  lb 11.2 oz 258 lb 2.5 oz  Weight (kg) 114.17 kg 114.17 kg 117.1 kg  Some encounter information is confidential and restricted. Go to Review Flowsheets activity to see all data.      Telemetry    Rate controlled atrial fib/atypical atrial flutter- Personally Reviewed  ECG    Atypical atrial flutter - Personally Reviewed  Physical Exam   GEN: Well nourished, well developed in no acute distress NECK: No JVD CARDIAC: regular rhythm, normal S1 and S2, no rubs or gallops. No murmur. VASCULAR: Radial pulses 2+ bilaterally.  RESPIRATORY:  Clear to auscultation without wheezing or rhonchi. Mild crackles at bases ABDOMEN: Soft, non-tender, non-distended MUSCULOSKELETAL:  Moves all 4 limbs independently SKIN: Warm and dry, no edema NEUROLOGIC:  No focal neuro deficits noted. PSYCHIATRIC:  Normal affect    Labs    High Sensitivity Troponin:   Recent Labs  Lab 01/29/21 1104 01/29/21 1304 02/07/21 1440 02/07/21 1717  TROPONINIHS 20* 18* 11 12      Chemistry Recent Labs  Lab 02/02/21 0318 02/07/21 1440 02/08/21 0201  NA 139 135 140  K 3.6 2.7* 3.1*  CL 100 93* 96*  CO2 27 31 30   GLUCOSE 86 165* 190*  BUN 54* 55* 54*  CREATININE 2.70* 3.10* 3.01*  CALCIUM 9.6 9.4  9.5  PROT  --   --  7.5  ALBUMIN  --   --  3.1*  AST  --   --  30  ALT  --   --  31  ALKPHOS  --   --  72  BILITOT  --   --  0.7  GFRNONAA 19* 16* 17*  ANIONGAP 12 11 14      Hematology Recent Labs  Lab 02/02/21 0318 02/07/21 1440 02/08/21 0201  WBC 7.2 6.2 6.3  RBC 3.93 4.34 3.95  HGB 10.8* 11.7* 10.6*  HCT 34.6* 37.4 33.8*  MCV 88.0 86.2 85.6  MCH 27.5 27.0 26.8  MCHC 31.2 31.3 31.4  RDW 14.3 14.0 14.1  PLT 245 270 251    BNP Recent Labs  Lab 02/07/21 2105  BNP 74.1     DDimer No results for input(s): DDIMER in the last 168 hours.   Radiology    DG Chest 2 View  Result Date: 02/07/2021 CLINICAL DATA:  Chest pain EXAM: CHEST - 2 VIEW COMPARISON:  01/29/2021 FINDINGS: Post sternotomy  changes with mitral and tricuspid valve repair and left atrial appendage clipping. Cardiomegaly with aortic atherosclerosis. No focal opacity, pleural effusion, or pneumothorax. IMPRESSION: No active cardiopulmonary disease.  Cardiomegaly. Electronically Signed   By: Donavan Foil M.D.   On: 02/07/2021 15:35    Cardiac Studies   Echo 01/02/21 1. No subcostal views; Elevated mean gradient across aortic valve (mean  gradient 13 mmHg) but visually valve opens well; S/P MV repair with mean  gradient 7 mmHg and trace MR; S/P TV repair with moderate TR.   2. Left ventricular ejection fraction, by estimation, is 60 to 65%. The  left ventricle has normal function. The left ventricle has no regional  wall motion abnormalities. There is moderate left ventricular hypertrophy.  There is the interventricular  septum is flattened in systole and diastole, consistent with right  ventricular pressure and volume overload.   3. Right ventricular systolic function is normal. The right ventricular  size is mildly enlarged.   4. Left atrial size was moderately dilated.   5. Right atrial size was mildly dilated.   6. The mitral valve has been repaired/replaced. Trivial mitral valve  regurgitation. No evidence of mitral stenosis.   7. The tricuspid valve is has been repaired/replaced. Tricuspid valve  regurgitation is moderate.   8. The aortic valve is tricuspid. Aortic valve regurgitation is not  visualized. No aortic stenosis is present.   Patient Profile     65 y.o. 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 She has had a complicated postoperative course, presents currently with right sided chest pain and generally feeling poorly.  Assessment & Plan    Chest pain: atypical, likely related to healing from open heart surgery, tender on palpation  Persistent afib s/p MAZE -continue amiodarone,  apixaban -was cardioverted and held briefly, returned to afib likely yesterday based on symptoms -we had a long discussion. She feels poorly in afib but is rate controlled and hemodynamically stable. She did not remain in sinus rhythm for more than a few days. She is planned for cardioversion tomorrow AM, but we discussed that she may return to afib post cardioversion. We discussed options for managing this so she does not have to return to the hospital. She does not want to go to a facility to get stronger, and we discussed the importance of strengthening  post op at home.   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 This time includes coordination of care and counseling regarding atrial fib, management options. Remainder of non-face-to-face time involved reviewing chart documents/testing relevant to the patient encounter and documentation in the medical record.  Buford Dresser, MD, PhD, [place]    For questions or updates, please contact [place] Please consult [email or web address] for contact info under     Signed, Buford Dresser, MD  02/08/2021, 9:00 AM

## 2021-02-08 NOTE — Consult Note (Signed)
[facility] Renal Consultation Note  Requesting MD:  Indication for Consultation: Maintenance of euvolemia, acute on chronic kidney disease, treatment and assessment of anemia, assessment of electrolyte and acid-base abnormalities.  HPI:  [name] is a 65 y.o. female.  With a fairly complex cardiac history that includes combined systolic diastolic heart failure, chronic atrial fibrillation recent mitral along with tricuspid valve repair and maze procedure by Dr. Roxan Hockey 12/2020.  She was admitted with chest pain.  She also was found to have acute on chronic kidney injury with a creatinine that increased from a baseline of 2 mg/dL to 3 mg/dL.  Blood pressure 1 3561 pulse 91 temperature 98.2 O2 sats 99% on room air.  Urine output 825 cc 02/08/2021  Sodium 140 potassium 3.1 chloride 96 CO2 30 BUN 54 creatinine 3 glucose 198 calcium 9.5 albumin 3.1 hemoglobin 10.6  Home medications:  amiodarone, Eliquis, Cymbalta, Levemir, metoprolol, Protonix, potassium, rosuvastatin, Demadex 40 mg daily, Trulicity  Hospital medications: Amiodarone IV, Eliquis 5 mg twice daily, Cymbalta 60 mg daily, Levemir insulin, metoprolol 25 mg daily, Protonix 40 mg daily, rosuvastatin.  Creatinine, Ser  Date/Time Value Ref Range Status  02/08/2021 02:01 AM 3.01 (H) 0.44 - 1.00 mg/dL Final  02/07/2021 02:40 PM 3.10 (H) 0.44 - 1.00 mg/dL Final  02/02/2021 03:18 AM 2.70 (H) 0.44 - 1.00 mg/dL Final  02/01/2021 02:11 AM 3.15 (H) 0.44 - 1.00 mg/dL Final  01/31/2021 02:52 PM 3.24 (H) 0.44 - 1.00 mg/dL Final  01/31/2021 06:40 AM 3.55 (H) 0.44 - 1.00 mg/dL Final  01/30/2021 02:11 AM 3.23 (H) 0.44 - 1.00 mg/dL Final  01/29/2021 11:04 AM 3.58 (H) 0.44 - 1.00 mg/dL Final  01/25/2021 11:16 AM 3.53 (H) 0.44 - 1.00 mg/dL Final  01/12/2021 04:30 AM 2.81 (H) 0.44 - 1.00 mg/dL Final  01/11/2021 06:05 AM 2.75 (H) 0.44 - 1.00 mg/dL Final  01/10/2021 04:44 AM 2.72 (H) 0.44 - 1.00 mg/dL Final  01/09/2021  05:04 AM 2.55 (H) 0.44 - 1.00 mg/dL Final  01/08/2021 05:00 AM 2.56 (H) 0.44 - 1.00 mg/dL Final  01/07/2021 04:32 AM 2.38 (H) 0.44 - 1.00 mg/dL Final  01/06/2021 05:13 AM 2.36 (H) 0.44 - 1.00 mg/dL Final  01/05/2021 05:04 AM 2.73 (H) 0.44 - 1.00 mg/dL Final  01/04/2021 02:52 PM 3.09 (H) 0.44 - 1.00 mg/dL Final  01/04/2021 03:39 AM 3.60 (H) 0.44 - 1.00 mg/dL Final  01/03/2021 04:19 AM 4.28 (H) 0.44 - 1.00 mg/dL Final  01/02/2021 04:33 AM 4.36 (H) 0.44 - 1.00 mg/dL Final  01/01/2021 03:53 AM 4.11 (H) 0.44 - 1.00 mg/dL Final  12/31/2020 03:06 AM 3.03 (H) 0.44 - 1.00 mg/dL Final  12/30/2020 04:41 PM 2.55 (H) 0.44 - 1.00 mg/dL Final  12/30/2020 04:14 AM 2.00 (H) 0.44 - 1.00 mg/dL Final  12/29/2020 09:17 PM 1.94 (H) 0.44 - 1.00 mg/dL Final  12/29/2020 02:24 PM 1.90 (H) 0.44 - 1.00 mg/dL Final  12/29/2020 01:33 PM 1.90 (H) 0.44 - 1.00 mg/dL Final  12/29/2020 12:59 PM 1.90 (H) 0.44 - 1.00 mg/dL Final  12/29/2020 11:57 AM 1.90 (H) 0.44 - 1.00 mg/dL Final  12/29/2020 11:07 AM 1.90 (H) 0.44 - 1.00 mg/dL Final  12/29/2020 10:14 AM 2.20 (H) 0.44 - 1.00 mg/dL Final  12/29/2020 09:20 AM 2.10 (H) 0.44 - 1.00 mg/dL Final  12/29/2020 04:17 AM 2.08 (H) 0.44 - 1.00 mg/dL Final  12/28/2020 04:51 AM 2.04 (H) 0.44 - 1.00 mg/dL Final  12/27/2020 04:35 AM 1.96 (H) 0.44 - 1.00 mg/dL Final  12/26/2020 01:42 AM  2.03 (H) 0.44 - 1.00 mg/dL Final  14/03/2431 50:00 AM 1.95 (H) 0.44 - 1.00 mg/dL Final  27/20/1015 83:58 AM 1.88 (H) 0.44 - 1.00 mg/dL Final  19/52/3443 81:46 AM 2.00 (H) 0.44 - 1.00 mg/dL Final  27/40/2536 72:45 AM 1.94 (H) 0.44 - 1.00 mg/dL Final  80/11/5002 06:21 AM 1.86 (H) 0.44 - 1.00 mg/dL Final  97/91/4144 30:65 AM 1.83 (H) 0.44 - 1.00 mg/dL Final  99/03/7430 00:04 AM 1.68 (H) 0.44 - 1.00 mg/dL Final  38/28/9666 27:03 PM 1.79 (H) 0.57 - 1.00 mg/dL Final  75/70/7700 05:74 AM 1.62 (H) 0.44 - 1.00 mg/dL Final  49/02/6269 17:84 AM 1.50 (H) 0.44 - 1.00 mg/dL Final  12/71/7914 21:29 AM 1.57 (H) 0.44 -  1.00 mg/dL Final  24/81/4045 39:68 AM 1.68 (H) 0.44 - 1.00 mg/dL Final  34/99/8831 63:61 AM 1.84 (H) 0.57 - 1.00 mg/dL Final  95/47/3502 19:20 AM 2.00 (H) 0.44 - 1.00 mg/dL Final  69/07/4616 00:14 AM 1.91 (H) 0.57 - 1.00 mg/dL Final     PMHx:   Past Medical History:  Diagnosis Date   Allergic rhinitis    Arthritis    Asthma    Chest pain 12/27/2017   Chronic diastolic CHF (congestive heart failure) (HCC)    COPD (chronic obstructive pulmonary disease) (HCC)    Depression    DM (diabetes mellitus) (HCC)    DVT (deep vein thrombosis) in pregnancy    Dysrhythmia    atrial fibrilation   GERD (gastroesophageal reflux disease)    Headache(784.0)    HTN (hypertension)    Hyperlipidemia    Obesity    Pneumonia 04/2018   RIGHT LOBE   Shortness of breath    Sleep apnea    compliant with CPAP    Past Surgical History:  Procedure Laterality Date   ABDOMINAL HYSTERECTOMY     BUBBLE STUDY  11/26/2020   Procedure: BUBBLE STUDY;  Surgeon: Jodelle Red, MD;  Location: [facility] ENDOSCOPY;  Service: Cardiovascular;;   CARDIOVERSION N/A 05/06/2020   Procedure: CARDIOVERSION;  Surgeon: Jodelle Red, MD;  Location: [facility] ENDOSCOPY;  Service: Cardiovascular;  Laterality: N/A;   CARDIOVERSION N/A 11/04/2020   Procedure: CARDIOVERSION;  Surgeon: Lewayne Bunting, MD;  Location: [facility] ENDOSCOPY;  Service: Cardiovascular;  Laterality: N/A;   CARDIOVERSION N/A 02/01/2021   Procedure: CARDIOVERSION;  Surgeon: Dolores Patty, MD;  Location: [facility] ENDOSCOPY;  Service: Cardiovascular;  Laterality: N/A;   CATARACT EXTRACTION, BILATERAL     CHOLECYSTECTOMY     COLONOSCOPY WITH PROPOFOL N/A 03/05/2015   Procedure: COLONOSCOPY WITH PROPOFOL;  Surgeon: Jeani Hawking, MD;  Location: [facility] ENDOSCOPY;  Service: Endoscopy;  Laterality: N/A;   DIAGNOSTIC LAPAROSCOPY     ingrown hallux Left    KNEE SURGERY     LEFT HEART CATH AND CORONARY ANGIOGRAPHY N/A 12/31/2017   Procedure: LEFT HEART CATH AND  CORONARY ANGIOGRAPHY;  Surgeon: Rinaldo Cloud, MD;  Location: [facility] INVASIVE CV LAB;  Service: Cardiovascular;  Laterality: N/A;   MAZE N/A 12/29/2020   Procedure: MAZE;  Surgeon: Loreli Slot, MD;  Location: [facility] OR;  Service: Open Heart Surgery;  Laterality: N/A;   MITRAL VALVE REPAIR N/A 12/29/2020   Procedure: MITRAL VALVE REPAIR USING CARBOMEDICS ANNULOFLEX RING SIZE 30;  Surgeon: Loreli Slot, MD;  Location: [facility] OR;  Service: Open Heart Surgery;  Laterality: N/A;   RIGHT HEART CATH N/A 12/22/2020   Procedure: RIGHT HEART CATH;  Surgeon: Laurey Morale, MD;  Location: [facility] INVASIVE CV LAB;  Service: Cardiovascular;  Laterality: N/A;   RIGHT HEART CATH N/A 01/31/2021   Procedure: RIGHT HEART CATH;  Surgeon: Jolaine Artist, MD;  Location: [place] CV LAB;  Service: Cardiovascular;  Laterality: N/A;   TEE WITHOUT CARDIOVERSION N/A 11/26/2020   Procedure: TRANSESOPHAGEAL ECHOCARDIOGRAM (TEE);  Surgeon: Buford Dresser, MD;  Location: [facility] ENDOSCOPY;  Service: Cardiovascular;  Laterality: N/A;   TEE WITHOUT CARDIOVERSION N/A 12/29/2020   Procedure: TRANSESOPHAGEAL ECHOCARDIOGRAM (TEE);  Surgeon: [name], MD;  Location: [place];  Service: Open Heart Surgery;  Laterality: N/A;   TRICUSPID VALVE REPLACEMENT N/A 12/29/2020   Procedure: 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 RING SIZE 34;  Surgeon: [name], MD;  Location: [place];  Service: Open Heart Surgery;  Laterality: N/A;    Family Hx:  Family History  Problem Relation Age of Onset   Cancer Mother    Hypertension Mother    Cancer Father    CAD Other    Hypertension Sister    Hypertension Brother     Social History:  reports that she has never smoked. She has never used smokeless tobacco. She reports that she does not drink alcohol and does not use drugs.  Allergies:  Allergies  Allergen Reactions   Sulfa Antibiotics Itching    Medications: Prior to Admission medications    Medication Sig Start Date End Date Taking? Authorizing Provider  Accu-Chek Softclix Lancets lancets Use as instructed to check blood sugar three times daily. E11.69 04/21/20  Yes Newlin, Charlane Ferretti, MD  albuterol (VENTOLIN HFA) 108 (90 Base) MCG/ACT inhaler Inhale 2 puffs into the lungs every 4 (four) hours as needed for wheezing or shortness of breath.  08/15/19  Yes [provider]  Alcohol Swabs (B-D SINGLE USE SWABS REGULAR) PADS 1 each by Other route daily. 02/19/19  Yes [provider]  amiodarone (PACERONE) 200 MG tablet Take 1 tablet (200 mg total) by mouth daily. 01/25/21  Yes Lyda Jester M, PA-C  apixaban (ELIQUIS) 5 MG TABS tablet Take 1 tablet (5 mg total) by mouth 2 (two) times daily. 11/09/20  Yes Buford Dresser, MD  B-D ULTRAFINE III SHORT PEN 31G X 8 MM MISC 1 each by Other route in the morning, at noon, in the evening, and at bedtime. 11/18/20  Yes [provider]  Blood Glucose Monitoring Suppl (ACCU-CHEK GUIDE ME) w/Device KIT Use as instructed to check blood sugar three times daily. E11.69 04/21/20  Yes Charlott Rakes, MD  Blood Glucose Monitoring Suppl (ONETOUCH VERIO) w/Device KIT Use to check blood sugar TID. E11.69 02/04/21  Yes Newlin, Enobong, MD  DULoxetine (CYMBALTA) 60 MG capsule Take 1 capsule (60 mg total) by mouth daily. 09/24/20  Yes [name], NP  EPINEPHrine 0.3 mg/0.3 mL IJ SOAJ injection Inject 0.3 mg into the muscle as needed for anaphylaxis. 06/25/20  Yes [name], NP  glucose blood (ONETOUCH VERIO) test strip Use to check blood sugar TID. E11.69 02/04/21  Yes Newlin, Enobong, MD  insulin aspart (NOVOLOG) 100 UNIT/ML injection Inject 12 Units into the skin 3 (three) times daily before meals. For blood sugars 0-199 give 0 units of insulin, 201-250 give 4 units, 251-300 give 6 units, 301-350 give 8 units, 351-400 give 10 units,> 400 give 12 units and call M.D. 06/24/20  Yes [name], NP  LEVEMIR 100 UNIT/ML  injection INJECT 0.4 MLS (40 UNITS TOTAL) INTO THE SKIN 2 (TWO) TIMES DAILY. Patient taking differently: Inject 40 Units into the skin 2 (two) times daily.  12/01/20  Yes [name], NP  metoprolol succinate (TOPROL-XL) 25 MG 24 hr tablet Take 1 tablet (25 mg total) by mouth daily. 02/02/21 03/04/21 Yes Alma Friendly, MD  Multiple Vitamins-Minerals (MULTIVITAMIN WOMEN PO) Take 1 tablet by mouth daily.    Yes [provider]  nitroGLYCERIN (NITROSTAT) 0.4 MG SL tablet Place 1 tablet (0.4 mg total) under the tongue every 5 (five) minutes x 3 doses as needed for chest pain. 01/08/14  Yes Charolette Forward, MD  OneTouch Delica Lancets 43H MISC Use to check blood sugar TID.  E11.69 02/04/21  Yes Newlin, Enobong, MD  oxyCODONE (OXY IR/ROXICODONE) 5 MG immediate release tablet Take 1 tablet (5 mg total) by mouth every 4 (four) hours as needed for severe pain. 01/12/21  Yes Barrett, Erin R, PA-C  pantoprazole (PROTONIX) 40 MG tablet Take 40 mg by mouth 2 (two) times daily. 12/27/20  Yes [provider]  Potassium Chloride ER 20 MEQ TBCR Take 20 mEq by mouth daily. 11/05/20  Yes Baldwin Jamaica, PA-C  RESTASIS 0.05 % ophthalmic emulsion Place 1 drop into both eyes 2 (two) times daily. 11/19/17  Yes [provider]  rosuvastatin (CRESTOR) 10 MG tablet Take 1 tablet (10 mg total) by mouth daily. 09/24/20  Yes [name], NP  torsemide (DEMADEX) 20 MG tablet Take 2 tablets (40 mg total) by mouth daily. 02/03/21  Yes Alma Friendly, MD  TRULICITY 1.5 WY/6.1UO SOPN INJECT 1.5 MG INTO THE SKIN ONCE A WEEK. 12/05/20  Yes [name], NP      Labs:  Results for orders placed or performed during the hospital encounter of 02/07/21 (from the past 48 hour(s))  Basic metabolic panel     Status: Abnormal   Collection Time: 02/07/21  2:40 PM  Result Value Ref Range   Sodium 135 135 - 145 mmol/L   Potassium 2.7 (LL) 3.5 - 5.1 mmol/L    Comment: CRITICAL RESULT CALLED  TO, READ BACK BY AND VERIFIED WITH: C WANG,RN 1602 02/07/2021 WBOND    Chloride 93 (L) 98 - 111 mmol/L   CO2 31 22 - 32 mmol/L   Glucose, Bld 165 (H) 70 - 99 mg/dL    Comment: Glucose reference range applies only to samples taken after fasting for at least 8 hours.   BUN 55 (H) 8 - 23 mg/dL   Creatinine, Ser 3.10 (H) 0.44 - 1.00 mg/dL   Calcium 9.4 8.9 - 10.3 mg/dL   GFR, Estimated 16 (L) >60 mL/min    Comment: (NOTE) Calculated using the CKD-EPI Creatinine Equation (2021)    Anion gap 11 5 - 15    Comment: Performed at [facility] [address], [place], [place] [address]  CBC     Status: Abnormal   Collection Time: 02/07/21  2:40 PM  Result Value Ref Range   WBC 6.2 4.0 - 10.5 K/uL   RBC 4.34 3.87 - 5.11 MIL/uL   Hemoglobin 11.7 (L) 12.0 - 15.0 g/dL   HCT 37.4 36.0 - 46.0 %   MCV 86.2 80.0 - 100.0 fL   MCH 27.0 26.0 - 34.0 pg   MCHC 31.3 30.0 - 36.0 g/dL   RDW 14.0 11.5 - 15.5 %   Platelets 270 150 - 400 K/uL   nRBC 0.0 0.0 - 0.2 %    Comment: Performed at [facility], [place] [address] [place], [place] [address]  Troponin I (High Sensitivity)     Status: None  Collection Time: 02/07/21  2:40 PM  Result Value Ref Range   Troponin I (High Sensitivity) 11 <18 ng/L    Comment: (NOTE) Elevated high sensitivity troponin I (hsTnI) values and significant  changes across serial measurements may suggest ACS but many other  chronic and acute conditions are known to elevate hsTnI results.  Refer to the Links section for chest pain algorithms and additional  guidance. Performed at [facility], [place] [address] [place], [place] [address]   SARS CORONAVIRUS 2 (TAT 6-24 HRS) Nasopharyngeal Nasopharyngeal Swab     Status: None   Collection Time: 02/07/21  5:16 PM   Specimen: Nasopharyngeal Swab  Result Value Ref Range   SARS Coronavirus 2 NEGATIVE NEGATIVE    Comment: (NOTE) SARS-CoV-2 target nucleic acids are NOT DETECTED.  The SARS-CoV-2 RNA is generally  detectable in upper and lower respiratory specimens during the acute phase of infection. Negative results do not preclude SARS-CoV-2 infection, do not rule out co-infections with other pathogens, and should not be used as the sole basis for treatment or other patient management decisions. Negative results must be combined with clinical observations, patient history, and epidemiological information. The expected result is Negative.  Fact Sheet for Patients: SugarRoll.be  Fact Sheet for Healthcare Providers: https://www.woods-mathews.com/  This test is not yet approved or cleared by the [place] FDA and  has been authorized for detection and/or diagnosis of SARS-CoV-2 by FDA under an Emergency Use Authorization (EUA). This EUA will remain  in effect (meaning this test can be used) for the duration of the COVID-19 declaration under Se ction 564(b)(1) of the Act, 21 U.S.C. section 360bbb-3(b)(1), unless the authorization is terminated or revoked sooner.  Performed at [facility], [place] [address] [place], [place] [address]   Troponin I (High Sensitivity)     Status: None   Collection Time: 02/07/21  5:17 PM  Result Value Ref Range   Troponin I (High Sensitivity) 12 <18 ng/L    Comment: (NOTE) Elevated high sensitivity troponin I (hsTnI) values and significant  changes across serial measurements may suggest ACS but many other  chronic and acute conditions are known to elevate hsTnI results.  Refer to the "Links" section for chest pain algorithms and additional  guidance. Performed at [facility], [place] [address] [place], [place] [address]   Brain natriuretic peptide     Status: None   Collection Time: 02/07/21  9:05 PM  Result Value Ref Range   B Natriuretic Peptide 74.1 0.0 - 100.0 pg/mL    Comment: Performed at [facility] [address], [place], [place] [address]  Magnesium     Status: None   Collection  Time: 02/07/21  9:05 PM  Result Value Ref Range   Magnesium 1.8 1.7 - 2.4 mg/dL    Comment: Performed at [facility], [place] [address] [place], [place] [address]  Glucose, capillary     Status: Abnormal   Collection Time: 02/07/21  9:16 PM  Result Value Ref Range   Glucose-Capillary 144 (H) 70 - 99 mg/dL    Comment: Glucose reference range applies only to samples taken after fasting for at least 8 hours.  CBC     Status: Abnormal   Collection Time: 02/08/21  2:01 AM  Result Value Ref Range   WBC 6.3 4.0 - 10.5 K/uL   RBC 3.95 3.87 - 5.11 MIL/uL   Hemoglobin 10.6 (L) 12.0 - 15.0 g/dL   HCT 33.8 (  L) 36.0 - 46.0 %   MCV 85.6 80.0 - 100.0 fL   MCH 26.8 26.0 - 34.0 pg   MCHC 31.4 30.0 - 36.0 g/dL   RDW 14.1 11.5 - 15.5 %   Platelets 251 150 - 400 K/uL   nRBC 0.0 0.0 - 0.2 %    Comment: Performed at [facility] [address], [place], [place] [address]  Comprehensive metabolic panel     Status: Abnormal   Collection Time: 02/08/21  2:01 AM  Result Value Ref Range   Sodium 140 135 - 145 mmol/L   Potassium 3.1 (L) 3.5 - 5.1 mmol/L   Chloride 96 (L) 98 - 111 mmol/L   CO2 30 22 - 32 mmol/L   Glucose, Bld 190 (H) 70 - 99 mg/dL    Comment: Glucose reference range applies only to samples taken after fasting for at least 8 hours.   BUN 54 (H) 8 - 23 mg/dL   Creatinine, Ser 3.01 (H) 0.44 - 1.00 mg/dL   Calcium 9.5 8.9 - 10.3 mg/dL   Total Protein 7.5 6.5 - 8.1 g/dL   Albumin 3.1 (L) 3.5 - 5.0 g/dL   AST 30 15 - 41 U/L   ALT 31 0 - 44 U/L   Alkaline Phosphatase 72 38 - 126 U/L   Total Bilirubin 0.7 0.3 - 1.2 mg/dL   GFR, Estimated 17 (L) >60 mL/min    Comment: (NOTE) Calculated using the CKD-EPI Creatinine Equation (2021)    Anion gap 14 5 - 15    Comment: Performed at [facility] [address], [place], [place] [address]  Glucose, capillary     Status: Abnormal   Collection Time: 02/08/21  8:42 AM  Result Value Ref Range   Glucose-Capillary 170 (H) 70 - 99  mg/dL    Comment: Glucose reference range applies only to samples taken after fasting for at least 8 hours.  Glucose, capillary     Status: Abnormal   Collection Time: 02/08/21 11:19 AM  Result Value Ref Range   Glucose-Capillary 176 (H) 70 - 99 mg/dL    Comment: Glucose reference range applies only to samples taken after fasting for at least 8 hours.  Urinalysis, Routine w reflex microscopic Urine, Clean Catch     Status: Abnormal   Collection Time: 02/08/21 11:25 AM  Result Value Ref Range   Color, Urine YELLOW YELLOW   APPearance CLEAR CLEAR   Specific Gravity, Urine 1.013 1.005 - 1.030   pH 5.0 5.0 - 8.0   Glucose, UA NEGATIVE NEGATIVE mg/dL   Hgb urine dipstick SMALL (A) NEGATIVE   Bilirubin Urine NEGATIVE NEGATIVE   Ketones, ur NEGATIVE NEGATIVE mg/dL   Protein, ur NEGATIVE NEGATIVE mg/dL   Nitrite NEGATIVE NEGATIVE   Leukocytes,Ua NEGATIVE NEGATIVE   RBC / HPF 0-5 0 - 5 RBC/hpf   WBC, UA 0-5 0 - 5 WBC/hpf   Bacteria, UA NONE SEEN NONE SEEN   Squamous Epithelial / LPF 0-5 0 - 5   Hyaline Casts, UA PRESENT     Comment: Performed at [facility], [place] [address], [place], [place] [address]     ROS:  General: No fever sweats chills Eyes: No double vision blurred vision loss of vision Ears nose throat: No hearing loss epistaxis sore throat Cardiovascular: Admits chest pain, no increased dyspnea cough no lower extremity swelling Respiratory: No cough, wheeze or hemoptysis Abdominal system: No abdominal pain nausea vomiting no diarrhea Urogenital no urgency  frequency dysuria Neurologic: No headache double vision blurred vision numbness tingling Musculoskeletal: No joint swelling pain or nonsteroidal anti-inflammatory drug use Endocrine: Diabetes no thyroid disease Dermatologic: No skin rash or itching   Physical Exam: Vitals:   02/08/21 0931 02/08/21 1120  BP: 103/76 135/61  Pulse: 92 88  Resp:  18  Temp:  98.2 F (36.8 C)  SpO2:  99%     General: Alert  obese nondistressed HEENT: Normocephalic atraumatic mucous membranes moist neck supple without JVP Eyes: Pupils round equal reactive nonicteric Neck: Supple no thyromegaly adenopathy JVP not elevated Heart: Irregular rate and rhythm faint 2/6 systolic murmur Lungs: Clear to auscultation diminished at bases Abdomen: Soft obese nontender no organosplenomegaly Extremities: Chubby calfs but no definite edema Skin: No skin rashes noted Neuro: Grossly intact  Assessment/Plan: Acute on chronic kidney disease.  Baseline serum creatinine appears.  2 mg/dL.  There is been a slight upward trend in serum creatinine since admission.  The does not appear to be administration of any nephrotoxins.  Urine microscopy bland.  Negative for protein.  Renal ultrasound unremarkable 12/2020.  Continue to avoid nephrotoxins.  Daily renal panel.  Follow ins and outs closely 2. Hypertension/volume  -incredibly difficult to assess accurately.  It does not appear that she is volume overloaded at this point.  She does use diuretics as an outpatient.  It may be reasonable to restart these as an inpatient.  Demadex 40 mg daily oral.  It does not appear that she needs IV diuresis at this point 3.  Anemia  -stable no indications for ESA's at this point 4.  Hypokalemia replete per primary service 5.  Bone/mineral check PTH follow renal panel daily   Sherril Croon 02/08/2021, 12:23 PM

## 2021-02-08 NOTE — Telephone Encounter (Signed)
Currently in hospital

## 2021-02-08 NOTE — Progress Notes (Signed)
PROGRESS NOTE  [name]  ZSW:[id number] DOB: 06-27-1956 DOA: 02/07/2021 PCP: [name], NP   Brief Narrative: [name] is a 65 y.o. female with medical history significant of chronic atrial fibrillation, CKD, combined systolic and diastolic heart failure, OSA, s/p MVR, TVR and maze procedure by Dr. Roxan Hockey in 12/2020 presents with complaints of substernal chest pain.  Pain waxes and wanes in intensity and radiates down her right arm.  Denies any change in chest pain symptoms with movement.  Reported associated symptoms of nausea, vomiting, abdominal pain, and lightheadedness.  Patient had just recently been hospitalized from 6/11-6/15 for chest pain and was noted to have acute exacerbation of combined systolic and diastolic congestive heart failure with acute kidney injury superimposed on chronic kidney disease.  During hospitalization.  Patient underwent cardioversion and was reported to be in sinus rhythm upon discharge.  During the hospitalization diuretics were held after right heart cath showed low filling pressures.  She is followed by cardiology and nephrology hospital stay, and she was started back on torsemide 40 mg daily which she reports taking as advised.  Despite doing this she reports that she has had worsening lower extremity swelling and weight gain of 1 to 2 pounds.  At home patient was not able to try anything for her symptoms and asked her family to call EMS.   In route with EMS patient was noted be in atrial fibrillation at 120-130bpm.   Upon admission to the emergency department patient was seen to be afebrile with heart rates elevated up to 115, and all other vital signs maintained.  Labs significant for hemoglobin 11.7, potassium 2.7, BUN 55, creatinine 3.1, high-sensitivity troponin 11.  Chest x-ray noted cardiomegaly without any acute abnormality.  Patient was given metoprolol 5 mg IV x1 dose, potassium chloride 40 mEq p.o., and 10 mEq  IV.  Cardiology was consulted and is planning repeat DCCV after amiodarone infusion.  Assessment & Plan: Principal Problem:   Chest pain Active Problems:   Diabetes mellitus (HCC)   Morbid obesity (HCC)   AF (paroxysmal atrial fibrillation) (HCC)   OSA (obstructive sleep apnea)   Nausea and vomiting   Acute kidney injury superimposed on CKD ([place])   S/P mitral valve repair  Atypical chest pain: More likely related to prior surgery with negative troponin and +tenderness to palpation.  - Oxycodone prn pain  PAF with recurrent AFib RVR s/p MAZE.  - Uninterrupted anticoagulation, continue eliquis.  - Continue amiodarone gtt with plans, per cardiology, to repeat DCCV in coming days.  - Continue metoprolol  AKI stage IV CKD: (based on current CrCl data) - Hold diuretic for now - UA bland, appreciate nephrology assistance.   T2DM: HbA1c 7.4% recently.  - Continue levemir at 30u BID (increase to home dose if needed) - Mod SSI   Chronic HFpEF:  - Monitor I/O and weights daily  HTN:  - Continue metoprolol  HLD:  - Continue rosuvastatin (ceiling 10mg  dose)  GERD:  - Continue PPI  OSA: Not on home CPAP.   Obesity: Estimated body mass index is 39.42 kg/m as calculated from the following:   Height as of this encounter: 5\' 7"  (1.702 m).   Weight as of this encounter: 114.2 kg.  DVT prophylaxis: Eliquis Code Status: Full Family Communication: None at bedside Disposition Plan:  Status is: Inpatient  Remains inpatient appropriate because:Ongoing diagnostic testing needed not appropriate for outpatient work up and Inpatient level of care appropriate due to severity of illness  Dispo:  The patient is from: Home              Anticipated d/c is to: Home              Patient currently is not medically stable to d/c.   Difficult to place patient No       Consultants:  Cardiology  Procedures:  DCCV planned  Antimicrobials: None   Subjective: Still with sharp chest  pain that is constant over the mid/right chest without dyspnea.   Objective: Vitals:   02/08/21 0500 02/08/21 0726 02/08/21 0931 02/08/21 1120  BP: 122/77 131/71 103/76 135/61  Pulse: 88 91 92 88  Resp: 19 18  18   Temp: 98 F (36.7 C) 97.8 F (36.6 C)  98.2 F (36.8 C)  TempSrc: Oral Oral  Oral  SpO2: 95% 99%  99%  Weight: 114.2 kg     Height:        Intake/Output Summary (Last 24 hours) at 02/08/2021 1419 Last data filed at 02/08/2021 1245 Gross per 24 hour  Intake 1078.48 ml  Output 1525 ml  Net -446.52 ml   Filed Weights   02/07/21 1425 02/07/21 2105 02/08/21 0500  Weight: 117.1 kg 114.2 kg 114.2 kg    Gen: 65 y.o. female in no distress Pulm: Non-labored breathing. Slight crackles bilaterally. CV: Rapid irreg No murmur, rub, or gallop. No JVD, no pitting pedal edema. GI: Abdomen soft, non-tender, non-distended, with normoactive bowel sounds. No organomegaly or masses felt. Ext: Warm, no deformities Skin: Middle chest keloid forming that is tender as is the entire chest wall where there are no visible or palpable abnormalities.  Neuro: Alert and oriented. No focal neurological deficits. Psych: Judgement and insight appear normal. Mood & affect appropriate.   Data Reviewed: I have personally reviewed following labs and imaging studies  CBC: Recent Labs  Lab 02/02/21 0318 02/07/21 1440 02/08/21 0201  WBC 7.2 6.2 6.3  NEUTROABS 3.5  --   --   HGB 10.8* 11.7* 10.6*  HCT 34.6* 37.4 33.8*  MCV 88.0 86.2 85.6  PLT 245 270 716   Basic Metabolic Panel: Recent Labs  Lab 02/02/21 0318 02/07/21 1440 02/07/21 2105 02/08/21 0201  NA 139 135  --  140  K 3.6 2.7*  --  3.1*  CL 100 93*  --  96*  CO2 27 31  --  30  GLUCOSE 86 165*  --  190*  BUN 54* 55*  --  54*  CREATININE 2.70* 3.10*  --  3.01*  CALCIUM 9.6 9.4  --  9.5  MG 2.0  --  1.8  --    GFR: Estimated Creatinine Clearance: 24.6 mL/min (A) (by C-G formula based on SCr of 3.01 mg/dL (H)). Liver Function  Tests: Recent Labs  Lab 02/08/21 0201  AST 30  ALT 31  ALKPHOS 72  BILITOT 0.7  PROT 7.5  ALBUMIN 3.1*   No results for input(s): LIPASE, AMYLASE in the last 168 hours. No results for input(s): AMMONIA in the last 168 hours. Coagulation Profile: No results for input(s): INR, PROTIME in the last 168 hours. Cardiac Enzymes: No results for input(s): CKTOTAL, CKMB, CKMBINDEX, TROPONINI in the last 168 hours. BNP (last 3 results) No results for input(s): PROBNP in the last 8760 hours. HbA1C: No results for input(s): HGBA1C in the last 72 hours. CBG: Recent Labs  Lab 02/02/21 1138 02/02/21 1626 02/07/21 2116 02/08/21 0842 02/08/21 1119  GLUCAP 118* 148* 144* 170* 176*   Lipid Profile: No  results for input(s): CHOL, HDL, LDLCALC, TRIG, CHOLHDL, LDLDIRECT in the last 72 hours. Thyroid Function Tests: No results for input(s): TSH, T4TOTAL, FREET4, T3FREE, THYROIDAB in the last 72 hours. Anemia Panel: No results for input(s): VITAMINB12, FOLATE, FERRITIN, TIBC, IRON, RETICCTPCT in the last 72 hours. Urine analysis:    Component Value Date/Time   COLORURINE YELLOW 02/08/2021 1125   APPEARANCEUR CLEAR 02/08/2021 1125   LABSPEC 1.013 02/08/2021 1125   PHURINE 5.0 02/08/2021 1125   GLUCOSEU NEGATIVE 02/08/2021 1125   HGBUR SMALL (A) 02/08/2021 1125   BILIRUBINUR NEGATIVE 02/08/2021 1125   BILIRUBINUR negative 09/24/2019 1742   KETONESUR NEGATIVE 02/08/2021 1125   PROTEINUR NEGATIVE 02/08/2021 1125   UROBILINOGEN 1.0 09/24/2019 1742   UROBILINOGEN 1.0 10/26/2014 0249   NITRITE NEGATIVE 02/08/2021 1125   LEUKOCYTESUR NEGATIVE 02/08/2021 1125   Recent Results (from the past 240 hour(s))  SARS CORONAVIRUS 2 (TAT 6-24 HRS) Nasopharyngeal Nasopharyngeal Swab     Status: None   Collection Time: 02/07/21  5:16 PM   Specimen: Nasopharyngeal Swab  Result Value Ref Range Status   SARS Coronavirus 2 NEGATIVE NEGATIVE Final    Comment: (NOTE) SARS-CoV-2 target nucleic acids are NOT  DETECTED.  The SARS-CoV-2 RNA is generally detectable in upper and lower respiratory specimens during the acute phase of infection. Negative results do not preclude SARS-CoV-2 infection, do not rule out co-infections with other pathogens, and should not be used as the sole basis for treatment or other patient management decisions. Negative results must be combined with clinical observations, patient history, and epidemiological information. The expected result is Negative.  Fact Sheet for Patients: SugarRoll.be  Fact Sheet for Healthcare Providers: https://www.woods-mathews.com/  This test is not yet approved or cleared by the [place] FDA and  has been authorized for detection and/or diagnosis of SARS-CoV-2 by FDA under an Emergency Use Authorization (EUA). This EUA will remain  in effect (meaning this test can be used) for the duration of the COVID-19 declaration under Se ction 564(b)(1) of the Act, 21 U.S.C. section 360bbb-3(b)(1), unless the authorization is terminated or revoked sooner.  Performed at [facility], [place] [address], [place], [place] [address]       Radiology Studies: DG Chest 2 View  Result Date: 02/07/2021 CLINICAL DATA:  Chest pain EXAM: CHEST - 2 VIEW COMPARISON:  01/29/2021 FINDINGS: Post sternotomy changes with mitral and tricuspid valve repair and left atrial appendage clipping. Cardiomegaly with aortic atherosclerosis. No focal opacity, pleural effusion, or pneumothorax. IMPRESSION: No active cardiopulmonary disease.  Cardiomegaly. Electronically Signed   By: Donavan Foil M.D.   On: 02/07/2021 15:35    Scheduled Meds:  apixaban  5 mg Oral BID   cycloSPORINE  1 drop Both Eyes BID   DULoxetine  60 mg Oral Daily   insulin aspart  0-15 Units Subcutaneous TID WC   insulin detemir  30 Units Subcutaneous BID   metoprolol succinate  25 mg Oral Daily   pantoprazole  40 mg Oral BID   potassium chloride  40  mEq Oral Once   rosuvastatin  10 mg Oral Daily   sodium chloride flush  3 mL Intravenous Q12H   Continuous Infusions:  amiodarone 30 mg/hr (02/08/21 0704)     LOS: 0 days   Time spent: 25 minutes.  Patrecia Pour, MD [facility] [email or web address] 02/08/2021, 2:19 PM

## 2021-02-09 ENCOUNTER — Inpatient Hospital Stay (HOSPITAL_COMMUNITY): Payer: Medicare Other | Admitting: Anesthesiology

## 2021-02-09 ENCOUNTER — Encounter (HOSPITAL_COMMUNITY): Payer: Self-pay | Admitting: Internal Medicine

## 2021-02-09 ENCOUNTER — Other Ambulatory Visit: Payer: Self-pay

## 2021-02-09 ENCOUNTER — Encounter (HOSPITAL_COMMUNITY)
Admission: EM | Disposition: A | Discharge status: Home Health | Payer: Self-pay | Source: Home / Self Care | Attending: Family Medicine

## 2021-02-09 ENCOUNTER — Ambulatory Visit (HOSPITAL_COMMUNITY): Admission: RE | Admit: 2021-02-09 | Payer: Medicare Other | Source: Home / Self Care | Admitting: Internal Medicine

## 2021-02-09 HISTORY — PX: CARDIOVERSION: SHX1299

## 2021-02-09 LAB — RENAL FUNCTION PANEL
Albumin: 3.1 g/dL — ABNORMAL LOW (ref 3.5–5.0)
Anion gap: 11 (ref 5–15)
BUN: 52 mg/dL — ABNORMAL HIGH (ref 8–23)
CO2: 30 mmol/L (ref 22–32)
Calcium: 9.7 mg/dL (ref 8.9–10.3)
Chloride: 97 mmol/L — ABNORMAL LOW (ref 98–111)
Creatinine, Ser: 2.6 mg/dL — ABNORMAL HIGH (ref 0.44–1.00)
GFR, Estimated: 20 mL/min — ABNORMAL LOW (ref >60)
Glucose, Bld: 109 mg/dL — ABNORMAL HIGH (ref 70–99)
Phosphorus: 4 mg/dL (ref 2.5–4.6)
Potassium: 3.1 mmol/L — ABNORMAL LOW (ref 3.5–5.1)
Sodium: 138 mmol/L (ref 135–145)

## 2021-02-09 LAB — C-REACTIVE PROTEIN: CRP: 0.6 mg/dL (ref <1.0)

## 2021-02-09 LAB — GLUCOSE, CAPILLARY
Glucose-Capillary: 130 mg/dL — ABNORMAL HIGH (ref 70–99)
Glucose-Capillary: 232 mg/dL — ABNORMAL HIGH (ref 70–99)
Glucose-Capillary: 186 mg/dL — ABNORMAL HIGH (ref 70–99)
Glucose-Capillary: 193 mg/dL — ABNORMAL HIGH (ref 70–99)

## 2021-02-09 LAB — SEDIMENTATION RATE: Sed Rate: 85 mm/hr — ABNORMAL HIGH (ref 0–22)

## 2021-02-09 LAB — PARATHYROID HORMONE, INTACT (NO CA): PTH: 88 pg/mL — ABNORMAL HIGH (ref 15–65)

## 2021-02-09 SURGERY — CARDIOVERSION
Anesthesia: General

## 2021-02-09 MED ORDER — AMIODARONE HCL 200 MG PO TABS: 200.0000 mg | ORAL_TABLET | Freq: Every day | ORAL | Status: DC

## 2021-02-09 MED ORDER — PROPOFOL 10 MG/ML IV BOLUS
INTRAVENOUS | Status: DC | PRN
  Administered 2021-02-09: 50 mg via INTRAVENOUS

## 2021-02-09 MED ORDER — SENNOSIDES-DOCUSATE SODIUM 8.6-50 MG PO TABS
1.0000 | ORAL_TABLET | Freq: Every evening | ORAL | Status: DC | PRN
Start: 2021-02-09 — End: 2021-02-10
  Administered 2021-02-09: 2 via ORAL
  Filled 2021-02-09: qty 2

## 2021-02-09 MED ORDER — LIDOCAINE 2% (20 MG/ML) 5 ML SYRINGE
INTRAMUSCULAR | Status: DC | PRN
  Administered 2021-02-09: 50 mg via INTRAVENOUS

## 2021-02-09 MED ORDER — POTASSIUM CHLORIDE CRYS ER 20 MEQ PO TBCR
40.0000 meq | EXTENDED_RELEASE_TABLET | Freq: Once | ORAL | Status: AC
  Administered 2021-02-09: 40 meq via ORAL
  Filled 2021-02-09: qty 2

## 2021-02-09 MED ORDER — AMIODARONE HCL 200 MG PO TABS
400.0000 mg | ORAL_TABLET | Freq: Two times a day (BID) | ORAL | Status: DC
  Administered 2021-02-09 – 2021-02-10 (×3): 400 mg via ORAL
  Filled 2021-02-09 (×3): qty 2

## 2021-02-09 MED ORDER — SODIUM CHLORIDE 0.9 % IV SOLN: INTRAVENOUS | Status: DC | PRN

## 2021-02-09 NOTE — Transfer of Care (Signed)
Immediate Anesthesia Transfer of Care Note  Patient: [name]  Procedure(s) Performed: CARDIOVERSION  Patient Location: Endoscopy Unit  Anesthesia Type:General  Level of Consciousness: drowsy and patient cooperative  Airway & Oxygen Therapy: Patient Spontanous Breathing and Patient connected to nasal cannula oxygen  Post-op Assessment: Report given to RN, Post -op Vital signs reviewed and stable and Patient moving all extremities  Post vital signs: Reviewed and stable  Last Vitals:  Vitals Value Taken Time  BP    Temp    Pulse 77 02/09/21 0842  Resp 22 02/09/21 0842  SpO2 100 % 02/09/21 0842  Vitals shown include unvalidated device data.  Last Pain:  Vitals:   02/09/21 0800  TempSrc: Oral  PainSc: 1          Complications: No notable events documented.

## 2021-02-09 NOTE — Anesthesia Preprocedure Evaluation (Addendum)
Anesthesia Evaluation  Patient identified by MRN, date of birth, ID band Patient awake    Reviewed: Allergy & Precautions, NPO status , Patient's Chart, lab work & pertinent test results  Airway Mallampati: III  TM Distance: >3 FB Neck ROM: Full    Dental  (+) Dental Advisory Given, Upper Dentures, Lower Dentures   Pulmonary shortness of breath, asthma , sleep apnea , COPD,    breath sounds clear to auscultation       Cardiovascular hypertension, Pt. on medications and Pt. on home beta blockers +CHF  + dysrhythmias Atrial Fibrillation + Valvular Problems/Murmurs MR  Rhythm:Irregular Rate:Normal    1. No subcostal views; Elevated mean gradient across aortic valve (mean  gradient 13 mmHg) but visually valve opens well; S/P MV repair with mean  gradient 7 mmHg and trace MR; S/P TV repair with moderate TR.  2. Left ventricular ejection fraction, by estimation, is 60 to 65%. The  left ventricle has normal function. The left ventricle has no regional  wall motion abnormalities. There is moderate left ventricular hypertrophy.  There is the interventricular  septum is flattened in systole and diastole, consistent with right  ventricular pressure and volume overload.  3. Right ventricular systolic function is normal. The right ventricular  size is mildly enlarged.  4. Left atrial size was moderately dilated.  5. Right atrial size was mildly dilated.  6. The mitral valve has been repaired/replaced. Trivial mitral valve  regurgitation. No evidence of mitral stenosis.  7. The tricuspid valve is has been repaired/replaced. Tricuspid valve  regurgitation is moderate.  8. The aortic valve is tricuspid. Aortic valve regurgitation is not  visualized. No aortic stenosis is present.    Neuro/Psych  Headaches, PSYCHIATRIC DISORDERS Depression    GI/Hepatic Neg liver ROS, GERD  Medicated,  Endo/Other  diabetes, Type 2, Insulin  DependentMorbid obesity  Renal/GU CRFRenal disease  negative genitourinary   Musculoskeletal  (+) Arthritis ,   Abdominal (+) + obese,   Peds  Hematology  (+) anemia ,   Anesthesia Other Findings   Reproductive/Obstetrics negative OB ROS                            Lab Results  Component Value Date   WBC 6.3 02/08/2021   HGB 10.6 (L) 02/08/2021   HCT 33.8 (L) 02/08/2021   MCV 85.6 02/08/2021   PLT 251 02/08/2021   Lab Results  Component Value Date   CREATININE 2.60 (H) 02/09/2021   BUN 52 (H) 02/09/2021   NA 138 02/09/2021   K 3.1 (L) 02/09/2021   CL 97 (L) 02/09/2021   CO2 30 02/09/2021    Anesthesia Physical  Anesthesia Plan  ASA: 3  Anesthesia Plan: General   Post-op Pain Management:    Induction:   PONV Risk Score and Plan: 3 and Treatment may vary due to age or medical condition, Propofol infusion and TIVA  Airway Management Planned: Mask  Additional Equipment: None  Intra-op Plan:   Post-operative Plan:   Informed Consent: I have reviewed the patients History and Physical, chart, labs and discussed the procedure including the risks, benefits and alternatives for the proposed anesthesia with the patient or authorized representative who has indicated his/her understanding and acceptance.       Plan Discussed with: CRNA and Anesthesiologist  Anesthesia Plan Comments:        Anesthesia Quick Evaluation

## 2021-02-09 NOTE — Anesthesia Postprocedure Evaluation (Signed)
Anesthesia Post Note  Patient: [name]  Procedure(s) Performed: CARDIOVERSION     Patient location during evaluation: PACU Anesthesia Type: General Level of consciousness: awake Pain management: pain level controlled Vital Signs Assessment: post-procedure vital signs reviewed and stable Respiratory status: spontaneous breathing and respiratory function stable Cardiovascular status: stable Postop Assessment: no apparent nausea or vomiting Anesthetic complications: no   No notable events documented.  Last Vitals:  Vitals:   02/09/21 0846 02/09/21 0850  BP: 116/61 123/71  Pulse: 77 77  Resp: (!) 22 (!) 23  Temp: 36.8 C   SpO2: 100% 100%    Last Pain:  Vitals:   02/09/21 0850  TempSrc:   PainSc: 0-No pain                 Merlinda Frederick

## 2021-02-09 NOTE — Interval H&P Note (Signed)
History and Physical Interval Note:  02/09/2021 8:29 AM  [name]  has presented today for surgery, with the diagnosis of AFIB.  The various methods of treatment have been discussed with the patient and family. After consideration of risks, benefits and other options for treatment, the patient has consented to  Procedure(s): CARDIOVERSION (N/A) as a surgical intervention.  The patient's history has been reviewed, patient examined, no change in status, stable for surgery.  I have reviewed the patient's chart and labs.  Questions were answered to the patient's satisfaction.     Shaunte Tuft

## 2021-02-09 NOTE — Evaluation (Signed)
Occupational Therapy Evaluation Patient Details Name: [name] MRN: [id number] DOB: 02-14-1956 Today's Date: 02/09/2021    History of Present Illness Pt is a 65 y/o female presenting on 02/07/2021 with cmoplaints of substernal chest pain. Pt recently hosptialized 6/11-6/15 for chest pain, noted to have acute exacerbation of combined systolic and diastolic CHF with acute kidney injury superimposed on CKD, and underwent cardioversion. CXR noted cardiomegaly without acute abnormality. PMH: chronic atrial fibrillation, CKD, combined systolic and diastolic heart failure, OSA, recent mitral along with tricuspid valve repair.   Clinical Impression   Pt presents with decline in function and safety with ADLs and ADL mobility with impaired strength, balance and endurance. PTA pt lived at home with family and was Ind with ADLs/selfcare, uses cane for mobility and has a RW. Pt currently requires min - min guard A for LB ADLs, min guard grooming standing at sink and min guard A for mobility using RW. Pt would benefit from acute  OT services to address impairments to maximize level of function and safety    Follow Up Recommendations  Home health OT;Supervision/Assistance - 24 hour    Equipment Recommendations  None recommended by OT    Recommendations for Other Services       Precautions / Restrictions Precautions Precautions: Fall Restrictions Weight Bearing Restrictions: No      Mobility Bed Mobility Overal bed mobility: Modified Independent Bed Mobility: Sit to Supine       Sit to supine: Modified independent (Device/Increase time);HOB elevated   General bed mobility comments: pt in recliner upon arrival    Transfers Overall transfer level: Needs assistance Equipment used: Rolling walker (2 wheeled) Transfers: Sit to/from Stand Sit to Stand: Min guard Stand pivot transfers: Min guard       General transfer comment: min G for safety    Balance Overall balance  assessment: Needs assistance Sitting-balance support: No upper extremity supported;Feet supported Sitting balance-Leahy Scale: Good     Standing balance support: Bilateral upper extremity supported;No upper extremity supported;During functional activity Standing balance-Leahy Scale: Fair Standing balance comment: static standing to don face mask without UE reliance                           ADL either performed or assessed with clinical judgement   ADL Overall ADL's : Needs assistance/impaired     Grooming: Wash/dry face;Wash/dry hands;Min guard;Standing       Lower Body Bathing: Sit to/from stand;Minimal assistance       Lower Body Dressing: Minimal assistance;Sit to/from stand   Toilet Transfer: Min guard;RW;Ambulation   Toileting- Water quality scientist and Hygiene: Min guard;Sit to/from stand       Functional mobility during ADLs: Min guard;Rolling walker;Cueing for safety General ADL Comments: pt limited by impaired activity tolerance, generalized weakness     Vision Baseline Vision/History: Wears glasses Patient Visual Report: No change from baseline       Perception     Praxis      Pertinent Vitals/Pain Pain Assessment: No/denies pain Pain Score: 0-No pain Pain Intervention(s): Monitored during session;Repositioned     Hand Dominance Right   Extremity/Trunk Assessment Upper Extremity Assessment Upper Extremity Assessment: Generalized weakness   Lower Extremity Assessment Lower Extremity Assessment: Defer to PT evaluation   Cervical / Trunk Assessment Cervical / Trunk Assessment: Other exceptions Cervical / Trunk Exceptions: obese   Communication Communication Communication: No difficulties   Cognition Arousal/Alertness: Awake/alert Behavior During Therapy: WFL for tasks assessed/performed Overall Cognitive  Status: Within Functional Limits for tasks assessed Area of Impairment: Safety/judgement;Problem solving                                    General Comments  VSS on RA    Exercises     Shoulder Instructions      Home Living Family/patient expects to be discharged to:: Private residence Living Arrangements: Children Available Help at Discharge: Family;Available 24 hours/day Type of Home: Apartment Home Access: Stairs to enter Entrance Stairs-Number of Steps: 1 + 1+ down then a landing then 2 steps up Entrance Stairs-Rails: Left Home Layout: One level     Bathroom Shower/Tub: Teacher, early years/pre: Standard     Home Equipment: Cane - single point;Walker - 4 wheels;Shower seat;Toilet riser          Prior Functioning/Environment Level of Independence: Independent with assistive device(s)        Comments: Pt reports benig Ind with ADLs/selfcare, using SPC for mobility, but uses rollator as needed when feeling weak.        OT Problem List: Impaired balance (sitting and/or standing);Decreased activity tolerance;Obesity      OT Treatment/Interventions: Self-care/ADL training;DME and/or AE instruction;Patient/family education    OT Goals(Current goals can be found in the care plan section) Acute Rehab OT Goals Patient Stated Goal: get better and go home OT Goal Formulation: With patient Time For Goal Achievement: 02/23/21 Potential to Achieve Goals: Good ADL Goals Pt Will Perform Grooming: with supervision;with set-up;standing Pt Will Perform Lower Body Bathing: with supervision;with set-up;sit to/from stand Pt Will Perform Lower Body Dressing: with supervision;with set-up;sit to/from stand Pt Will Transfer to Toilet: with supervision;with modified independence;ambulating Pt Will Perform Toileting - Clothing Manipulation and hygiene: with supervision;with modified independence;sit to/from stand  OT Frequency: Min 2X/week   Barriers to D/C:            Co-evaluation              AM-PAC OT "6 Clicks" Daily Activity     Outcome Measure Help from another  person eating meals?: None Help from another person taking care of personal grooming?: A Little Help from another person toileting, which includes using toliet, bedpan, or urinal?: A Little Help from another person bathing (including washing, rinsing, drying)?: A Little Help from another person to put on and taking off regular upper body clothing?: None Help from another person to put on and taking off regular lower body clothing?: A Little 6 Click Score: 20   End of Session Equipment Utilized During Treatment: Rolling walker  Activity Tolerance: Patient limited by fatigue Patient left: with call bell/phone within reach;in bed  OT Visit Diagnosis: Unsteadiness on feet (R26.81);History of falling (Z91.81);Dizziness and giddiness (R42)                Time: 3154-0086 OT Time Calculation (min): 19 min Charges:  OT General Charges $OT Visit: 1 Visit OT Evaluation $OT Eval Low Complexity: 1 Low    Britt Bottom 02/09/2021, 2:44 PM

## 2021-02-09 NOTE — Progress Notes (Signed)
[facility] ROUNDING NOTE   Subjective:   Interval History: This 65 year old lady with complex cardiac history that combined systolic diastolic heart failure chronic atrial fibrillation recent mitral and tricuspid valve repair with maze procedure by Dr. Roxan Hockey 12/2020.  She has baseline chronic kidney disease with creatinine of 2 mg/dL to 3 mg/dL.   Sodium 138 potassium 3.1 chloride 97 CO2 30 creatinine BUN 5.2 creatinine 2.6 calcium 9.7 albumin 3.1 hemoglobin 10.6  Urine output 1300 cc 02/09/2019   Blood pressure 120/61 pulse 84 temperature 98 O2 sats 98% room air  Objective:  Vital signs in last 24 hours:  Temp:  [97.2 F (36.2 C)-98.2 F (36.8 C)] 98 F (36.7 C) (06/22 0942) Pulse Rate:  [77-100] 84 (06/22 0942) Resp:  [14-23] 16 (06/22 0942) BP: (100-161)/(58-91) 122/61 (06/22 0942) SpO2:  [95 %-100 %] 98 % (06/22 0942) Weight:  [114.3 kg] 114.3 kg (06/22 0800)  Weight change: -2.794 kg Filed Weights   02/08/21 0500 02/09/21 0617 02/09/21 0800  Weight: 114.2 kg 114.3 kg 114.3 kg    Intake/Output: I/O last 3 completed shifts: In: 7672 [P.O.:960; I.V.:385] Out: 2525 [Urine:2525]   Intake/Output this shift:  Total I/O In: 150 [I.V.:150] Out: -   CVS- RRR RS- CTA ABD- BS present soft non-distended EXT- no edema   Basic Metabolic Panel: Recent Labs  Lab 02/07/21 1440 02/07/21 2105 02/08/21 0201 02/09/21 0323  NA 135  --  140 138  K 2.7*  --  3.1* 3.1*  CL 93*  --  96* 97*  CO2 31  --  30 30  GLUCOSE 165*  --  190* 109*  BUN 55*  --  54* 52*  CREATININE 3.10*  --  3.01* 2.60*  CALCIUM 9.4  --  9.5 9.7  MG  --  1.8  --   --   PHOS  --   --   --  4.0    Liver Function Tests: Recent Labs  Lab 02/08/21 0201 02/09/21 0323  AST 30  --   ALT 31  --   ALKPHOS 72  --   BILITOT 0.7  --   PROT 7.5  --   ALBUMIN 3.1* 3.1*   No results for input(s): LIPASE, AMYLASE in the last 168 hours. No results for input(s): AMMONIA in the last  168 hours.  CBC: Recent Labs  Lab 02/07/21 1440 02/08/21 0201  WBC 6.2 6.3  HGB 11.7* 10.6*  HCT 37.4 33.8*  MCV 86.2 85.6  PLT 270 251    Cardiac Enzymes: No results for input(s): CKTOTAL, CKMB, CKMBINDEX, TROPONINI in the last 168 hours.  BNP: Invalid input(s): POCBNP  CBG: Recent Labs  Lab 02/08/21 0842 02/08/21 1119 02/08/21 1703 02/08/21 2123 02/09/21 0933  GLUCAP 170* 176* 160* 185* 130*    Microbiology: Results for orders placed or performed during the hospital encounter of 02/07/21  SARS CORONAVIRUS 2 (TAT 6-24 HRS) Nasopharyngeal Nasopharyngeal Swab     Status: None   Collection Time: 02/07/21  5:16 PM   Specimen: Nasopharyngeal Swab  Result Value Ref Range Status   SARS Coronavirus 2 NEGATIVE NEGATIVE Final    Comment: (NOTE) SARS-CoV-2 target nucleic acids are NOT DETECTED.  The SARS-CoV-2 RNA is generally detectable in upper and lower respiratory specimens during the acute phase of infection. Negative results do not preclude SARS-CoV-2 infection, do not rule out co-infections with other pathogens, and should not be used as the sole basis for treatment or other patient management decisions. Negative results must  be combined with clinical observations, patient history, and epidemiological information. The expected result is Negative.  Fact Sheet for Patients: SugarRoll.be  Fact Sheet for Healthcare Providers: https://www.woods-mathews.com/  This test is not yet approved or cleared by the [place] FDA and  has been authorized for detection and/or diagnosis of SARS-CoV-2 by FDA under an Emergency Use Authorization (EUA). This EUA will remain  in effect (meaning this test can be used) for the duration of the COVID-19 declaration under Se ction 564(b)(1) of the Act, 21 U.S.C. section 360bbb-3(b)(1), unless the authorization is terminated or revoked sooner.  Performed at [facility], [place]  [address], [place], [place] [address]     Coagulation Studies: No results for input(s): LABPROT, INR in the last 72 hours.  Urinalysis: Recent Labs    02/08/21 1125  COLORURINE YELLOW  LABSPEC 1.013  PHURINE 5.0  GLUCOSEU NEGATIVE  HGBUR SMALL*  BILIRUBINUR NEGATIVE  KETONESUR NEGATIVE  PROTEINUR NEGATIVE  NITRITE NEGATIVE  LEUKOCYTESUR NEGATIVE      Imaging: DG Chest 2 View  Result Date: 02/07/2021 CLINICAL DATA:  Chest pain EXAM: CHEST - 2 VIEW COMPARISON:  01/29/2021 FINDINGS: Post sternotomy changes with mitral and tricuspid valve repair and left atrial appendage clipping. Cardiomegaly with aortic atherosclerosis. No focal opacity, pleural effusion, or pneumothorax. IMPRESSION: No active cardiopulmonary disease.  Cardiomegaly. Electronically Signed   By: Donavan Foil M.D.   On: 02/07/2021 15:35     Medications:    amiodarone 30 mg/hr (02/09/21 0610)    apixaban  5 mg Oral BID   cycloSPORINE  1 drop Both Eyes BID   DULoxetine  60 mg Oral Daily   insulin aspart  0-15 Units Subcutaneous TID WC   insulin detemir  30 Units Subcutaneous BID   metoprolol succinate  25 mg Oral Daily   pantoprazole  40 mg Oral BID   rosuvastatin  10 mg Oral Daily   sodium chloride flush  3 mL Intravenous Q12H   acetaminophen **OR** acetaminophen, albuterol, ondansetron **OR** ondansetron (ZOFRAN) IV, oxyCODONE, prochlorperazine  Assessment/ Plan:  Acute on chronic kidney disease.  Baseline serum creatinine appears.  2 mg/dL.  There is been a slight upward trend in serum creatinine since admission.  The does not appear to be administration of any nephrotoxins.  Urine microscopy bland.  Negative for protein.  Renal ultrasound unremarkable 12/2020.  Continue to avoid nephrotoxins.  Daily renal panel.  Follow ins and outs closely 2. Hypertension/volume  -incredibly difficult to assess accurately.  It does not appear that she is volume overloaded at this point.  She does use diuretics as an outpatient.   It may be reasonable to restart these as an inpatient.  Demadex 40 mg daily oral.  It does not appear that she needs IV diuresis at this point 3.  Anemia  -stable no indications for ESA's at this point 4.  Hypokalemia replete per primary service 5.  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  Please call back if needed for further assistance    LOS: [place] @TODAY @11 :07 AM

## 2021-02-09 NOTE — H&P (View-Only) (Signed)
Cardiology Progress Note  Patient ID: [name] MRN: [id number] DOB: 1956/04/17 Date of Encounter: 02/09/2021  Primary Cardiologist: Buford Dresser, MD  Subjective   Chief Complaint: Still having right-sided chest pain  HPI: Remains in A. fib.  Plans for cardioversion morning.  Still has right-sided sharp chest pain worse with inspiration.  Suspect this is pleuritic in nature related to recent surgery.  ROS:  All other ROS reviewed and negative. Pertinent positives noted in the HPI.     Inpatient Medications  Scheduled Meds:  [MAR Hold] apixaban  5 mg Oral BID   [MAR Hold] cycloSPORINE  1 drop Both Eyes BID   [MAR Hold] DULoxetine  60 mg Oral Daily   [MAR Hold] insulin aspart  0-15 Units Subcutaneous TID WC   [MAR Hold] insulin detemir  30 Units Subcutaneous BID   [MAR Hold] metoprolol succinate  25 mg Oral Daily   [MAR Hold] pantoprazole  40 mg Oral BID   [MAR Hold] potassium chloride  40 mEq Oral Once   [MAR Hold] rosuvastatin  10 mg Oral Daily   [MAR Hold] sodium chloride flush  3 mL Intravenous Q12H   Continuous Infusions:  amiodarone 30 mg/hr (02/09/21 0610)   PRN Meds: [[facility] Hold] acetaminophen **OR** [MAR Hold] acetaminophen, [MAR Hold] albuterol, [MAR Hold] ondansetron **OR** [MAR Hold] ondansetron (ZOFRAN) IV, [MAR Hold] oxyCODONE, [MAR Hold] prochlorperazine   Vital Signs   Vitals:   02/08/21 2052 02/09/21 0100 02/09/21 0447 02/09/21 0617  BP: 114/75 (!) 125/58 100/66   Pulse:  83 94   Resp:  15 17   Temp: 98 F (36.7 C) 97.8 F (36.6 C) 98 F (36.7 C)   TempSrc: Oral Oral Oral   SpO2:  100% 95%   Weight:    114.3 kg  Height:        Intake/Output Summary (Last 24 hours) at 02/09/2021 0804 Last data filed at 02/09/2021 0618 Gross per 24 hour  Intake 1083.85 ml  Output 1825 ml  Net -741.15 ml   Last 3 Weights 02/09/2021 02/08/2021 02/07/2021  Weight (lbs) 252 lb 251 lb 11.2 oz 251 lb 11.2 oz  Weight (kg) 114.306 kg 114.17 kg 114.17 kg   Some encounter information is confidential and restricted. Go to Review Flowsheets activity to see all data.      Telemetry  Overnight telemetry shows atrial fibrillation heart rate 80-90 bpm, which I personally reviewed.   Physical Exam   Vitals:   02/08/21 2052 02/09/21 0100 02/09/21 0447 02/09/21 0617  BP: 114/75 (!) 125/58 100/66   Pulse:  83 94   Resp:  15 17   Temp: 98 F (36.7 C) 97.8 F (36.6 C) 98 F (36.7 C)   TempSrc: Oral Oral Oral   SpO2:  100% 95%   Weight:    114.3 kg  Height:        Intake/Output Summary (Last 24 hours) at 02/09/2021 0804 Last data filed at 02/09/2021 0618 Gross per 24 hour  Intake 1083.85 ml  Output 1825 ml  Net -741.15 ml    Last 3 Weights 02/09/2021 02/08/2021 02/07/2021  Weight (lbs) 252 lb 251 lb 11.2 oz 251 lb 11.2 oz  Weight (kg) 114.306 kg 114.17 kg 114.17 kg  Some encounter information is confidential and restricted. Go to Review Flowsheets activity to see all data.    Body mass index is 39.47 kg/m.   General: Well nourished, well developed, in no acute distress Head: Atraumatic, normal size  Eyes: PEERLA, EOMI  Neck: Supple,  no JVD Endocrine: No thryomegaly Cardiac: Normal S1, S2; irregular rhythm Lungs: Clear to auscultation bilaterally, no wheezing, rhonchi or rales  Abd: Soft, nontender, no hepatomegaly  Ext: No edema, pulses 2+ Musculoskeletal: No deformities, BUE and BLE strength normal and equal, tenderness to palpation on the right chest wall Skin: Warm and dry, no rashes   Neuro: Alert and oriented to person, place, time, and situation, CNII-XII grossly intact, no focal deficits  Psych: Normal mood and affect   Labs  High Sensitivity Troponin:   Recent Labs  Lab 01/29/21 1104 01/29/21 1304 02/07/21 1440 02/07/21 1717  TROPONINIHS 20* 18* 11 12     Cardiac EnzymesNo results for input(s): TROPONINI in the last 168 hours. No results for input(s): TROPIPOC in the last 168 hours.  Chemistry Recent Labs  Lab  02/07/21 1440 02/08/21 0201 02/09/21 0323  NA 135 140 138  K 2.7* 3.1* 3.1*  CL 93* 96* 97*  CO2 $Re'31 30 30  'Rmr$ GLUCOSE 165* 190* 109*  BUN 55* 54* 52*  CREATININE 3.10* 3.01* 2.60*  CALCIUM 9.4 9.5 9.7  PROT  --  7.5  --   ALBUMIN  --  3.1* 3.1*  AST  --  30  --   ALT  --  31  --   ALKPHOS  --  72  --   BILITOT  --  0.7  --   GFRNONAA 16* 17* 20*  ANIONGAP $RemoveB'11 14 11    'mQgTiJYI$ Hematology Recent Labs  Lab 02/07/21 1440 02/08/21 0201  WBC 6.2 6.3  RBC 4.34 3.95  HGB 11.7* 10.6*  HCT 37.4 33.8*  MCV 86.2 85.6  MCH 27.0 26.8  MCHC 31.3 31.4  RDW 14.0 14.1  PLT 270 251   BNP Recent Labs  Lab 02/07/21 2105  BNP 74.1    DDimer No results for input(s): DDIMER in the last 168 hours.   Radiology  DG Chest 2 View  Result Date: 02/07/2021 CLINICAL DATA:  Chest pain EXAM: CHEST - 2 VIEW COMPARISON:  01/29/2021 FINDINGS: Post sternotomy changes with mitral and tricuspid valve repair and left atrial appendage clipping. Cardiomegaly with aortic atherosclerosis. No focal opacity, pleural effusion, or pneumothorax. IMPRESSION: No active cardiopulmonary disease.  Cardiomegaly. Electronically Signed   By: Donavan Foil M.D.   On: 02/07/2021 15:35    Cardiac Studies  TTE 01/02/2021   1. No subcostal views; Elevated mean gradient across aortic valve (mean  gradient 13 mmHg) but visually valve opens well; S/P MV repair with mean  gradient 7 mmHg and trace MR; S/P TV repair with moderate TR.   2. Left ventricular ejection fraction, by estimation, is 60 to 65%. The  left ventricle has normal function. The left ventricle has no regional  wall motion abnormalities. There is moderate left ventricular hypertrophy.  There is the interventricular  septum is flattened in systole and diastole, consistent with right  ventricular pressure and volume overload.   3. Right ventricular systolic function is normal. The right ventricular  size is mildly enlarged.   4. Left atrial size was moderately dilated.    5. Right atrial size was mildly dilated.   6. The mitral valve has been repaired/replaced. Trivial mitral valve  regurgitation. No evidence of mitral stenosis.   7. The tricuspid valve is has been repaired/replaced. Tricuspid valve  regurgitation is moderate.   8. The aortic valve is tricuspid. Aortic valve regurgitation is not  visualized. No aortic stenosis is present.   Patient Profile  [name] is a  65 y.o. female with systolic heart failure with recovery of ejection fraction, mitral valve repair tricuspid valve repair, persistent atrial fibrillation status post maze, diabetes, obesity who was admitted on 02/07/2021 with sharp right-sided chest pain and recurrent atrial fibrillation.  Assessment & Plan   Chest pain, atypical -Pleuritic chest pain likely related to recent sternotomy.  I will check an ESR and CRP just to make sure she does not have any pericarditis.  Symptoms are not classic for this.  She may benefit from a short course of anti-inflammatory agents. -For now we will just continue Tylenol as needed -BNP negative.  Troponin is negative.  Nonobstructive minimal CAD on left heart cath in 2019.  This is not related to CAD.  2.  Persistent atrial fibrillation status post maze -She has had several failed cardioversions.  She is on amiodarone.  She is status post maze.  The plan is to reattempt cardioversion on IV amiodarone therapy. -Overall I feel she is heading towards AV node ablation with pacemaker therapy to control her A. fib.  She has just been very difficult to control. -We will continue with cardioversion today. -Continue IV amiodarone. -Continue Eliquis.  No missed doses in last 3 weeks.  3.  Systolic heart failure with recovery of EF -Most recent echo shows EF 60 to 65% -BNP normal. -No evidence of volume overload.  For questions or updates, please contact [place] Please consult [email or web address] for contact info under   Time Spent with  Patient: I have spent a total of 35 minutes with patient reviewing hospital notes, telemetry, EKGs, labs and examining the patient as well as establishing an assessment and plan that was discussed with the patient.  > 50% of time was spent in direct patient care.    Signed, Addison Naegeli. Audie Box, MD, [place]  02/09/2021 8:04 AM

## 2021-02-09 NOTE — Progress Notes (Addendum)
PROGRESS NOTE  [name]  HKV:[id number] DOB: [birth year] DOA: 02/07/2021 PCP: [name], NP   Brief Narrative: [name] is a 65 y.o. female with medical history significant of chronic atrial fibrillation, CKD, combined systolic and diastolic heart failure, OSA, s/p MVR, TVR and maze procedure by Dr. Roxan Hockey in 12/2020 presents with complaints of substernal chest pain.  Pain waxes and wanes in intensity and radiates down her right arm.  Denies any change in chest pain symptoms with movement.  Reported associated symptoms of nausea, vomiting, abdominal pain, and lightheadedness.  Patient had just recently been hospitalized from 6/11-6/15 for chest pain and was noted to have acute exacerbation of combined systolic and diastolic congestive heart failure with acute kidney injury superimposed on chronic kidney disease.  During hospitalization.  Patient underwent cardioversion and was reported to be in sinus rhythm upon discharge.  During the hospitalization diuretics were held after right heart cath showed low filling pressures.  She is followed by cardiology and nephrology hospital stay, and she was started back on torsemide 40 mg daily which she reports taking as advised.  Despite doing this she reports that she has had worsening lower extremity swelling and weight gain of 1 to 2 pounds.  At home patient was not able to try anything for her symptoms and asked her family to call EMS.   In route with EMS patient was noted be in atrial fibrillation at 120-130bpm.   Upon admission to the emergency department patient was seen to be afebrile with heart rates elevated up to 115, and all other vital signs maintained.  Labs significant for hemoglobin 11.7, potassium 2.7, BUN 55, creatinine 3.1, high-sensitivity troponin 11.  Chest x-ray noted cardiomegaly without any acute abnormality.  Patient was given metoprolol 5 mg IV x1 dose, potassium chloride 40 mEq p.o., and 10 mEq  IV.  Cardiology was consulted and is planning repeat DCCV after amiodarone infusion.  Assessment & Plan: Principal Problem:   Chest pain Active Problems:   Diabetes mellitus (HCC)   Morbid obesity (HCC)   AF (paroxysmal atrial fibrillation) (HCC)   OSA (obstructive sleep apnea)   Nausea and vomiting   Acute kidney injury superimposed on CKD ([place])   S/P mitral valve repair  Atypical chest pain: More likely related to prior surgery with negative troponin and +tenderness to palpation.  - Oxycodone prn pain - Inflammatory markers checked per cardiology.   PAF with recurrent AFib RVR s/p MAZE and despite DCCV: Repeat DCCV performed 6/22 after amiodarone load.   - Uninterrupted anticoagulation, continue eliquis.  - Post cardioversion care per cardiology. If unsuccessful, may need evaluation for ablation/PPM.  - Continue metoprolol  AKI stage IV CKD: (based on current CrCl data) - Hold diuretic for now - UA bland, appreciate nephrology assistance.   T2DM: HbA1c 7.4% recently.  - Continue levemir at 30u BID + SSI.  Chronic HFpEF, history of HFrEF (EF since recovered to 60-65%):  - Monitor I/O and weights daily.  - No overload, will defer diuretic to cardiology/nephrology.  HTN:  - Continue metoprolol  HLD:  - Continue rosuvastatin (ceiling 10mg  dose)  GERD:  - Continue PPI  OSA: Not on home CPAP.   Hypokalemia:  - Supplemented this AM. Continue monitoring with Mg.   Obesity: Estimated body mass index is 39.47 kg/m as calculated from the following:   Height as of this encounter: 5\' 7"  (1.702 m).   Weight as of this encounter: 114.3 kg.  DVT prophylaxis: Eliquis Code Status: Full  Family Communication: None at bedside Disposition Plan:  Status is: Inpatient  Remains inpatient appropriate because:Ongoing diagnostic testing needed not appropriate for outpatient work up and Inpatient level of care appropriate due to severity of illness  Dispo: The patient is from:  Home              Anticipated d/c is to: Home w/HH. Presumably 6/23 if remains in sinus rhythm.              Patient currently is not medically stable to d/c.   Difficult to place patient No  Consultants:  Cardiology  Procedures:  DCCV 02/09/2021  Antimicrobials: None   Subjective: Sharp, predominantly right sided chest pain is stable, worse with movements, not associated with dyspnea. Overall this feels better since cardioversion. No other complaints.   Objective: Vitals:   02/09/21 0850 02/09/21 0855 02/09/21 0905 02/09/21 0942  BP: 123/71 123/71 134/72 122/61  Pulse: 77 77 79 84  Resp: (!) 23 (!) 23 20 16   Temp:    98 F (36.7 C)  TempSrc:    Oral  SpO2: 100% 99% 98% 98%  Weight:      Height:        Intake/Output Summary (Last 24 hours) at 02/09/2021 1450 Last data filed at 02/09/2021 0841 Gross per 24 hour  Intake 513.85 ml  Output 1000 ml  Net -486.15 ml   Filed Weights   02/08/21 0500 02/09/21 0617 02/09/21 0800  Weight: 114.2 kg 114.3 kg 114.3 kg   Gen: 65 y.o. female in no distress Pulm: Nonlabored breathing room air. Clear. CV: Regular rate and rhythm. No murmur, rub, or gallop. No JVD, no pitting dependent edema. GI: Abdomen soft, non-tender, non-distended, with normoactive bowel sounds.  Ext: Warm, no deformities Skin: No rashes, lesions or ulcers on visualized skin. Neuro: Alert and oriented. No focal neurological deficits. Psych: Judgement and insight appear fair. Mood euthymic & affect congruent. Behavior is appropriate.    Data Reviewed: I have personally reviewed following labs and imaging studies  CBC: Recent Labs  Lab 02/07/21 1440 02/08/21 0201  WBC 6.2 6.3  HGB 11.7* 10.6*  HCT 37.4 33.8*  MCV 86.2 85.6  PLT 270 124   Basic Metabolic Panel: Recent Labs  Lab 02/07/21 1440 02/07/21 2105 02/08/21 0201 02/09/21 0323  NA 135  --  140 138  K 2.7*  --  3.1* 3.1*  CL 93*  --  96* 97*  CO2 31  --  30 30  GLUCOSE 165*  --  190* 109*   BUN 55*  --  54* 52*  CREATININE 3.10*  --  3.01* 2.60*  CALCIUM 9.4  --  9.5 9.7  MG  --  1.8  --   --   PHOS  --   --   --  4.0   GFR: Estimated Creatinine Clearance: 28.5 mL/min (A) (by C-G formula based on SCr of 2.6 mg/dL (H)). Liver Function Tests: Recent Labs  Lab 02/08/21 0201 02/09/21 0323  AST 30  --   ALT 31  --   ALKPHOS 72  --   BILITOT 0.7  --   PROT 7.5  --   ALBUMIN 3.1* 3.1*   No results for input(s): LIPASE, AMYLASE in the last 168 hours. No results for input(s): AMMONIA in the last 168 hours. Coagulation Profile: No results for input(s): INR, PROTIME in the last 168 hours. Cardiac Enzymes: No results for input(s): CKTOTAL, CKMB, CKMBINDEX, TROPONINI in the last 168 hours. BNP (last  3 results) No results for input(s): PROBNP in the last 8760 hours. HbA1C: No results for input(s): HGBA1C in the last 72 hours. CBG: Recent Labs  Lab 02/08/21 1119 02/08/21 1703 02/08/21 2123 02/09/21 0933 02/09/21 1159  GLUCAP 176* 160* 185* 130* 232*   Lipid Profile: No results for input(s): CHOL, HDL, LDLCALC, TRIG, CHOLHDL, LDLDIRECT in the last 72 hours. Thyroid Function Tests: No results for input(s): TSH, T4TOTAL, FREET4, T3FREE, THYROIDAB in the last 72 hours. Anemia Panel: No results for input(s): VITAMINB12, FOLATE, FERRITIN, TIBC, IRON, RETICCTPCT in the last 72 hours. Urine analysis:    Component Value Date/Time   COLORURINE YELLOW 02/08/2021 1125   APPEARANCEUR CLEAR 02/08/2021 1125   LABSPEC 1.013 02/08/2021 1125   PHURINE 5.0 02/08/2021 1125   GLUCOSEU NEGATIVE 02/08/2021 1125   HGBUR SMALL (A) 02/08/2021 1125   BILIRUBINUR NEGATIVE 02/08/2021 1125   BILIRUBINUR negative 09/24/2019 1742   KETONESUR NEGATIVE 02/08/2021 1125   PROTEINUR NEGATIVE 02/08/2021 1125   UROBILINOGEN 1.0 09/24/2019 1742   UROBILINOGEN 1.0 10/26/2014 0249   NITRITE NEGATIVE 02/08/2021 1125   LEUKOCYTESUR NEGATIVE 02/08/2021 1125   Recent Results (from the past 240  hour(s))  SARS CORONAVIRUS 2 (TAT 6-24 HRS) Nasopharyngeal Nasopharyngeal Swab     Status: None   Collection Time: 02/07/21  5:16 PM   Specimen: Nasopharyngeal Swab  Result Value Ref Range Status   SARS Coronavirus 2 NEGATIVE NEGATIVE Final    Comment: (NOTE) SARS-CoV-2 target nucleic acids are NOT DETECTED.  The SARS-CoV-2 RNA is generally detectable in upper and lower respiratory specimens during the acute phase of infection. Negative results do not preclude SARS-CoV-2 infection, do not rule out co-infections with other pathogens, and should not be used as the sole basis for treatment or other patient management decisions. Negative results must be combined with clinical observations, patient history, and epidemiological information. The expected result is Negative.  Fact Sheet for Patients: SugarRoll.be  Fact Sheet for Healthcare Providers: https://www.woods-mathews.com/  This test is not yet approved or cleared by the [place] FDA and  has been authorized for detection and/or diagnosis of SARS-CoV-2 by FDA under an Emergency Use Authorization (EUA). This EUA will remain  in effect (meaning this test can be used) for the duration of the COVID-19 declaration under Se ction 564(b)(1) of the Act, 21 U.S.C. section 360bbb-3(b)(1), unless the authorization is terminated or revoked sooner.  Performed at [facility], [place] [address] [place], [place] [address]       Radiology Studies: DG Chest 2 View  Result Date: 02/07/2021 CLINICAL DATA:  Chest pain EXAM: CHEST - 2 VIEW COMPARISON:  01/29/2021 FINDINGS: Post sternotomy changes with mitral and tricuspid valve repair and left atrial appendage clipping. Cardiomegaly with aortic atherosclerosis. No focal opacity, pleural effusion, or pneumothorax. IMPRESSION: No active cardiopulmonary disease.  Cardiomegaly. Electronically Signed   By: Donavan Foil M.D.   On: 02/07/2021 15:35     Scheduled Meds:  amiodarone  400 mg Oral BID   Followed by   Derrill Memo ON 02/16/2021] amiodarone  200 mg Oral Daily   apixaban  5 mg Oral BID   cycloSPORINE  1 drop Both Eyes BID   DULoxetine  60 mg Oral Daily   insulin aspart  0-15 Units Subcutaneous TID WC   insulin detemir  30 Units Subcutaneous BID   metoprolol succinate  25 mg Oral Daily   pantoprazole  40 mg Oral BID   rosuvastatin  10 mg Oral Daily   sodium chloride  flush  3 mL Intravenous Q12H   Continuous Infusions:     LOS: 1 day   Time spent: 25 minutes.  Patrecia Pour, MD [facility] [email or web address] 02/09/2021, 2:50 PM

## 2021-02-09 NOTE — Evaluation (Signed)
Physical Therapy Evaluation Patient Details Name: [name] MRN: [id number] DOB: February 26, 1956 Today's Date: 02/09/2021   History of Present Illness  Pt is a 65 y/o female presenting on 02/07/2021 with cmoplaints of substernal chest pain. Pt recently hosptialized 6/11-6/15 for chest pain, noted to have acute exacerbation of combined systolic and diastolic CHF with acute kidney injury superimposed on CKD, and underwent cardioversion. CXR noted cardiomegaly without acute abnormality. PMH: chronic atrial fibrillation, CKD, combined systolic and diastolic heart failure, OSA, recent mitral along with tricuspid valve repair.  Clinical Impression  Pt demonstrates ability to perform bed mobility, transfers, and ambulate without requiring physical assistance. Pt tolerates gait of household distances with use of RW, limited by fatigue. Pt reports typically using SPC for mobility, but when feeling weak may use rollator. Pt may benefit from gait training with use of SPC at next session. Pt demonstrates generalized weakness, decreased activity tolerance, and deficits in balance and will benefit from continued acute PT to improve independence in mobility. SPT recommends HHPT to improve activity tolerance and return to prior level.     Follow Up Recommendations Home health PT    Equipment Recommendations  None recommended by PT    Recommendations for Other Services       Precautions / Restrictions Precautions Precautions: Fall Restrictions Weight Bearing Restrictions: No      Mobility  Bed Mobility Overal bed mobility: Modified Independent Bed Mobility: Supine to Sit           General bed mobility comments: HOB elevated    Transfers Overall transfer level: Needs assistance Equipment used: Rolling walker (2 wheeled) Transfers: Sit to/from Stand Sit to Stand: Min guard         General transfer comment: min G for safety  Ambulation/Gait Ambulation/Gait assistance: Min  guard;Supervision Gait Distance (Feet): 200 Feet Assistive device: Rolling walker (2 wheeled) Gait Pattern/deviations: Step-through pattern;Decreased stride length Gait velocity: decreased Gait velocity interpretation: 1.31 - 2.62 ft/sec, indicative of limited community ambulator General Gait Details: slow step-through gait with use of RW, limited by fatigue.  Stairs            Wheelchair Mobility    Modified Rankin (Stroke Patients Only)       Balance Overall balance assessment: Needs assistance Sitting-balance support: No upper extremity supported;Feet supported Sitting balance-Leahy Scale: Good     Standing balance support: Bilateral upper extremity supported;No upper extremity supported;During functional activity Standing balance-Leahy Scale: Fair Standing balance comment: static standing to don face mask without UE reliance                             Pertinent Vitals/Pain Pain Assessment: No/denies pain    Home Living Family/patient expects to be discharged to:: Private residence Living Arrangements: Children (daughter, grand children) Available Help at Discharge: Family;Available 24 hours/day Type of Home: Apartment Home Access: Stairs to enter Entrance Stairs-Rails: Left Entrance Stairs-Number of Steps: 1 + 1+ down then a landing then 2 steps up Home Layout: One level Home Equipment: Cane - single point;Walker - 4 wheels;Shower seat;Toilet riser      Prior Function Level of Independence: Independent with assistive device(s)         Comments: Pt reports using SPC for mobility, but uses rollator as needed when feeling weak.     Hand Dominance        Extremity/Trunk Assessment   Upper Extremity Assessment Upper Extremity Assessment: Defer to OT evaluation  Lower Extremity Assessment Lower Extremity Assessment: Generalized weakness    Cervical / Trunk Assessment Cervical / Trunk Assessment: Other exceptions Cervical / Trunk  Exceptions: obese  Communication   Communication: No difficulties  Cognition Arousal/Alertness: Awake/alert Behavior During Therapy: WFL for tasks assessed/performed Overall Cognitive Status: Within Functional Limits for tasks assessed                                        General Comments General comments (skin integrity, edema, etc.): VSS on RA    Exercises     Assessment/Plan    PT Assessment Patient needs continued PT services  PT Problem List         PT Treatment Interventions DME instruction;Gait training;Stair training;Functional mobility training;Therapeutic activities;Therapeutic exercise;Patient/family education;Balance training    PT Goals (Current goals can be found in the Care Plan section)  Acute Rehab PT Goals Patient Stated Goal: get better and go home PT Goal Formulation: With patient Time For Goal Achievement: 02/23/21 Potential to Achieve Goals: Good    Frequency Min 3X/week   Barriers to discharge        Co-evaluation               AM-PAC PT "6 Clicks" Mobility  Outcome Measure Help needed turning from your back to your side while in a flat bed without using bedrails?: None Help needed moving from lying on your back to sitting on the side of a flat bed without using bedrails?: None Help needed moving to and from a bed to a chair (including a wheelchair)?: A Little Help needed standing up from a chair using your arms (e.g., wheelchair or bedside chair)?: A Little Help needed to walk in hospital room?: A Little Help needed climbing 3-5 steps with a railing? : A Lot 6 Click Score: 19    End of Session Equipment Utilized During Treatment: Gait belt Activity Tolerance: Patient limited by fatigue Patient left: in chair;with call bell/phone within reach Nurse Communication: Mobility status PT Visit Diagnosis: Other abnormalities of gait and mobility (R26.89);Muscle weakness (generalized) (M62.81)    Time: 7564-3329 PT Time  Calculation (min) (ACUTE ONLY): 18 min   Charges:   PT Evaluation $PT Eval Low Complexity: 1 Low          Acute Rehab  Pager: [phone]   Garwin Brothers, SPT  02/09/2021, 12:54 PM

## 2021-02-09 NOTE — Progress Notes (Signed)
Cardiology Progress Note  Patient ID: [name] MRN: [id number] DOB: December 06, 1955 Date of Encounter: 02/09/2021  Primary Cardiologist: Buford Dresser, MD  Subjective   Chief Complaint: Still having right-sided chest pain  HPI: Remains in A. fib.  Plans for cardioversion morning.  Still has right-sided sharp chest pain worse with inspiration.  Suspect this is pleuritic in nature related to recent surgery.  ROS:  All other ROS reviewed and negative. Pertinent positives noted in the HPI.     Inpatient Medications  Scheduled Meds:  [MAR Hold] apixaban  5 mg Oral BID   [MAR Hold] cycloSPORINE  1 drop Both Eyes BID   [MAR Hold] DULoxetine  60 mg Oral Daily   [MAR Hold] insulin aspart  0-15 Units Subcutaneous TID WC   [MAR Hold] insulin detemir  30 Units Subcutaneous BID   [MAR Hold] metoprolol succinate  25 mg Oral Daily   [MAR Hold] pantoprazole  40 mg Oral BID   [MAR Hold] potassium chloride  40 mEq Oral Once   [MAR Hold] rosuvastatin  10 mg Oral Daily   [MAR Hold] sodium chloride flush  3 mL Intravenous Q12H   Continuous Infusions:  amiodarone 30 mg/hr (02/09/21 0610)   PRN Meds: [BWL Hold] acetaminophen **OR** [MAR Hold] acetaminophen, [MAR Hold] albuterol, [MAR Hold] ondansetron **OR** [MAR Hold] ondansetron (ZOFRAN) IV, [MAR Hold] oxyCODONE, [MAR Hold] prochlorperazine   Vital Signs   Vitals:   02/08/21 2052 02/09/21 0100 02/09/21 0447 02/09/21 0617  BP: 114/75 (!) 125/58 100/66   Pulse:  83 94   Resp:  15 17   Temp: 98 F (36.7 C) 97.8 F (36.6 C) 98 F (36.7 C)   TempSrc: Oral Oral Oral   SpO2:  100% 95%   Weight:    114.3 kg  Height:        Intake/Output Summary (Last 24 hours) at 02/09/2021 0804 Last data filed at 02/09/2021 0618 Gross per 24 hour  Intake 1083.85 ml  Output 1825 ml  Net -741.15 ml   Last 3 Weights 02/09/2021 02/08/2021 02/07/2021  Weight (lbs) 252 lb 251 lb 11.2 oz 251 lb 11.2 oz  Weight (kg) 114.306 kg 114.17 kg 114.17 kg   Some encounter information is confidential and restricted. Go to Review Flowsheets activity to see all data.      Telemetry  Overnight telemetry shows atrial fibrillation heart rate 80-90 bpm, which I personally reviewed.   Physical Exam   Vitals:   02/08/21 2052 02/09/21 0100 02/09/21 0447 02/09/21 0617  BP: 114/75 (!) 125/58 100/66   Pulse:  83 94   Resp:  15 17   Temp: 98 F (36.7 C) 97.8 F (36.6 C) 98 F (36.7 C)   TempSrc: Oral Oral Oral   SpO2:  100% 95%   Weight:    114.3 kg  Height:        Intake/Output Summary (Last 24 hours) at 02/09/2021 0804 Last data filed at 02/09/2021 0618 Gross per 24 hour  Intake 1083.85 ml  Output 1825 ml  Net -741.15 ml    Last 3 Weights 02/09/2021 02/08/2021 02/07/2021  Weight (lbs) 252 lb 251 lb 11.2 oz 251 lb 11.2 oz  Weight (kg) 114.306 kg 114.17 kg 114.17 kg  Some encounter information is confidential and restricted. Go to Review Flowsheets activity to see all data.    Body mass index is 39.47 kg/m.   General: Well nourished, well developed, in no acute distress Head: Atraumatic, normal size  Eyes: PEERLA, EOMI  Neck: Supple,  no JVD Endocrine: No thryomegaly Cardiac: Normal S1, S2; irregular rhythm Lungs: Clear to auscultation bilaterally, no wheezing, rhonchi or rales  Abd: Soft, nontender, no hepatomegaly  Ext: No edema, pulses 2+ Musculoskeletal: No deformities, BUE and BLE strength normal and equal, tenderness to palpation on the right chest wall Skin: Warm and dry, no rashes   Neuro: Alert and oriented to person, place, time, and situation, CNII-XII grossly intact, no focal deficits  Psych: Normal mood and affect   Labs  High Sensitivity Troponin:   Recent Labs  Lab 01/29/21 1104 01/29/21 1304 02/07/21 1440 02/07/21 1717  TROPONINIHS 20* 18* 11 12     Cardiac EnzymesNo results for input(s): TROPONINI in the last 168 hours. No results for input(s): TROPIPOC in the last 168 hours.  Chemistry Recent Labs  Lab  02/07/21 1440 02/08/21 0201 02/09/21 0323  NA 135 140 138  K 2.7* 3.1* 3.1*  CL 93* 96* 97*  CO2 $Re'31 30 30  'dad$ GLUCOSE 165* 190* 109*  BUN 55* 54* 52*  CREATININE 3.10* 3.01* 2.60*  CALCIUM 9.4 9.5 9.7  PROT  --  7.5  --   ALBUMIN  --  3.1* 3.1*  AST  --  30  --   ALT  --  31  --   ALKPHOS  --  72  --   BILITOT  --  0.7  --   GFRNONAA 16* 17* 20*  ANIONGAP $RemoveB'11 14 11    'RCAVwdks$ Hematology Recent Labs  Lab 02/07/21 1440 02/08/21 0201  WBC 6.2 6.3  RBC 4.34 3.95  HGB 11.7* 10.6*  HCT 37.4 33.8*  MCV 86.2 85.6  MCH 27.0 26.8  MCHC 31.3 31.4  RDW 14.0 14.1  PLT 270 251   BNP Recent Labs  Lab 02/07/21 2105  BNP 74.1    DDimer No results for input(s): DDIMER in the last 168 hours.   Radiology  DG Chest 2 View  Result Date: 02/07/2021 CLINICAL DATA:  Chest pain EXAM: CHEST - 2 VIEW COMPARISON:  01/29/2021 FINDINGS: Post sternotomy changes with mitral and tricuspid valve repair and left atrial appendage clipping. Cardiomegaly with aortic atherosclerosis. No focal opacity, pleural effusion, or pneumothorax. IMPRESSION: No active cardiopulmonary disease.  Cardiomegaly. Electronically Signed   By: Donavan Foil M.D.   On: 02/07/2021 15:35    Cardiac Studies  TTE 01/02/2021   1. No subcostal views; Elevated mean gradient across aortic valve (mean  gradient 13 mmHg) but visually valve opens well; S/P MV repair with mean  gradient 7 mmHg and trace MR; S/P TV repair with moderate TR.   2. Left ventricular ejection fraction, by estimation, is 60 to 65%. The  left ventricle has normal function. The left ventricle has no regional  wall motion abnormalities. There is moderate left ventricular hypertrophy.  There is the interventricular  septum is flattened in systole and diastole, consistent with right  ventricular pressure and volume overload.   3. Right ventricular systolic function is normal. The right ventricular  size is mildly enlarged.   4. Left atrial size was moderately dilated.    5. Right atrial size was mildly dilated.   6. The mitral valve has been repaired/replaced. Trivial mitral valve  regurgitation. No evidence of mitral stenosis.   7. The tricuspid valve is has been repaired/replaced. Tricuspid valve  regurgitation is moderate.   8. The aortic valve is tricuspid. Aortic valve regurgitation is not  visualized. No aortic stenosis is present.   Patient Profile  [name] is a  65 y.o. female with systolic heart failure with recovery of ejection fraction, mitral valve repair tricuspid valve repair, persistent atrial fibrillation status post maze, diabetes, obesity who was admitted on 02/07/2021 with sharp right-sided chest pain and recurrent atrial fibrillation.  Assessment & Plan   Chest pain, atypical -Pleuritic chest pain likely related to recent sternotomy.  I will check an ESR and CRP just to make sure she does not have any pericarditis.  Symptoms are not classic for this.  She may benefit from a short course of anti-inflammatory agents. -For now we will just continue Tylenol as needed -BNP negative.  Troponin is negative.  Nonobstructive minimal CAD on left heart cath in 2019.  This is not related to CAD.  2.  Persistent atrial fibrillation status post maze -She has had several failed cardioversions.  She is on amiodarone.  She is status post maze.  The plan is to reattempt cardioversion on IV amiodarone therapy. -Overall I feel she is heading towards AV node ablation with pacemaker therapy to control her A. fib.  She has just been very difficult to control. -We will continue with cardioversion today. -Continue IV amiodarone. -Continue Eliquis.  No missed doses in last 3 weeks.  3.  Systolic heart failure with recovery of EF -Most recent echo shows EF 60 to 65% -BNP normal. -No evidence of volume overload.  For questions or updates, please contact [place] Please consult [email or web address] for contact info under   Time Spent with  Patient: I have spent a total of 35 minutes with patient reviewing hospital notes, telemetry, EKGs, labs and examining the patient as well as establishing an assessment and plan that was discussed with the patient.  > 50% of time was spent in direct patient care.    Signed, Addison Naegeli. Audie Box, MD, [place]  02/09/2021 8:04 AM

## 2021-02-10 ENCOUNTER — Telehealth (INDEPENDENT_AMBULATORY_CARE_PROVIDER_SITE_OTHER): Payer: Self-pay

## 2021-02-10 ENCOUNTER — Encounter (HOSPITAL_COMMUNITY): Payer: Medicare Other

## 2021-02-10 ENCOUNTER — Other Ambulatory Visit (HOSPITAL_COMMUNITY): Payer: Self-pay | Admitting: Cardiology

## 2021-02-10 ENCOUNTER — Encounter (HOSPITAL_COMMUNITY): Payer: Self-pay | Admitting: Internal Medicine

## 2021-02-10 LAB — MAGNESIUM: Magnesium: 1.8 mg/dL (ref 1.7–2.4)

## 2021-02-10 LAB — BASIC METABOLIC PANEL
Anion gap: 9 (ref 5–15)
BUN: 51 mg/dL — ABNORMAL HIGH (ref 8–23)
CO2: 28 mmol/L (ref 22–32)
Calcium: 9.1 mg/dL (ref 8.9–10.3)
Chloride: 99 mmol/L (ref 98–111)
Creatinine, Ser: 2.59 mg/dL — ABNORMAL HIGH (ref 0.44–1.00)
GFR, Estimated: 20 mL/min — ABNORMAL LOW (ref >60)
Glucose, Bld: 134 mg/dL — ABNORMAL HIGH (ref 70–99)
Potassium: 3.2 mmol/L — ABNORMAL LOW (ref 3.5–5.1)
Sodium: 136 mmol/L (ref 135–145)

## 2021-02-10 LAB — GLUCOSE, CAPILLARY
Glucose-Capillary: 129 mg/dL — ABNORMAL HIGH (ref 70–99)
Glucose-Capillary: 149 mg/dL — ABNORMAL HIGH (ref 70–99)

## 2021-02-10 MED ORDER — POTASSIUM CHLORIDE CRYS ER 20 MEQ PO TBCR
40.0000 meq | EXTENDED_RELEASE_TABLET | Freq: Once | ORAL | Status: AC
  Administered 2021-02-10: 40 meq via ORAL
  Filled 2021-02-10: qty 2

## 2021-02-10 MED ORDER — BISACODYL 10 MG RE SUPP: 10.0000 mg | Freq: Every day | RECTAL | Status: DC | PRN

## 2021-02-10 MED ORDER — BLOOD GLUCOSE MONITOR KIT: PACK | 0 refills | Status: DC

## 2021-02-10 MED ORDER — PREDNISONE 5 MG PO TABS: 5.0000 mg | ORAL_TABLET | Freq: Every day | ORAL | Status: DC

## 2021-02-10 MED ORDER — AMIODARONE HCL 200 MG PO TABS: ORAL_TABLET | ORAL | 0 refills | Status: DC

## 2021-02-10 MED ORDER — COLCHICINE 0.3 MG HALF TABLET
0.3000 mg | ORAL_TABLET | Freq: Every day | ORAL | Status: DC
  Administered 2021-02-10: 0.3 mg via ORAL
  Filled 2021-02-10: qty 1

## 2021-02-10 MED ORDER — PREDNISONE 20 MG PO TABS
40.0000 mg | ORAL_TABLET | Freq: Every day | ORAL | Status: DC
  Administered 2021-02-10: 40 mg via ORAL
  Filled 2021-02-10: qty 2

## 2021-02-10 MED ORDER — PREDNISONE 10 MG PO TABS: ORAL_TABLET | ORAL | 0 refills | Status: DC

## 2021-02-10 MED ORDER — PREDNISONE 20 MG PO TABS: 20.0000 mg | ORAL_TABLET | Freq: Every day | ORAL | Status: DC

## 2021-02-10 MED ORDER — PREDNISONE 10 MG PO TABS: 10.0000 mg | ORAL_TABLET | Freq: Every day | ORAL | Status: DC

## 2021-02-10 MED ORDER — COLCHICINE 0.6 MG PO TABS: 0.3000 mg | ORAL_TABLET | Freq: Every day | ORAL | 0 refills | Status: DC

## 2021-02-10 NOTE — Progress Notes (Signed)
MD requested close f/u. Pt has appt 7/6 with [facility]. Per Dr. Audie Box this can suffice for requested post-hospital follow-up, does not need gen cards at this time as well as patient has multiple appts in the post-hospital period (PCP, CVTS, AHF). Tyffani Foglesong PA-C

## 2021-02-10 NOTE — Telephone Encounter (Signed)
Copied from [place] [phone]. Topic: General - Other >> Feb 10, 2021  8:45 AM Alanda Slim E wrote: Reason for CRM: The diabetes store called and they received RX for diabetic shoes but it is not readable/ please refax to fax# [phone]

## 2021-02-10 NOTE — Progress Notes (Signed)
Cardiology Progress Note  Patient ID: [name] MRN: [id number] DOB: 06/08/1956 Date of Encounter: 02/10/2021  Primary Cardiologist: Buford Dresser, MD  Subjective   Chief Complaint: Chest pain  HPI: Status post cardioversion.  Maintaining sinus rhythm.  Elevated ESR.  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  ROS:  All other ROS reviewed and negative. Pertinent positives noted in the HPI.     Inpatient Medications  Scheduled Meds:  amiodarone  400 mg Oral BID   Followed by   Derrill Memo ON 02/16/2021] amiodarone  200 mg Oral Daily   apixaban  5 mg Oral BID   colchicine  0.3 mg Oral Daily   cycloSPORINE  1 drop Both Eyes BID   DULoxetine  60 mg Oral Daily   insulin aspart  0-15 Units Subcutaneous TID WC   insulin detemir  30 Units Subcutaneous BID   metoprolol succinate  25 mg Oral Daily   pantoprazole  40 mg Oral BID   potassium chloride  40 mEq Oral Once   predniSONE  40 mg Oral Q breakfast   Followed by   Derrill Memo ON 02/13/2021] predniSONE  20 mg Oral Q breakfast   Followed by   Derrill Memo ON 02/16/2021] predniSONE  10 mg Oral Q breakfast   Followed by   Derrill Memo ON 02/19/2021] predniSONE  5 mg Oral Q breakfast   rosuvastatin  10 mg Oral Daily   sodium chloride flush  3 mL Intravenous Q12H   Continuous Infusions:  PRN Meds: acetaminophen **OR** acetaminophen, albuterol, ondansetron **OR** ondansetron (ZOFRAN) IV, oxyCODONE, prochlorperazine, senna-docusate   Vital Signs   Vitals:   02/09/21 0855 02/09/21 0905 02/09/21 0942 02/10/21 0522  BP: 123/71 134/72 122/61 (!) 129/96  Pulse: 77 79 84 86  Resp: (!) $RemoveB'23 20 16 20  'FJeBkkFu$ Temp:   98 F (36.7 C) 98.4 F (36.9 C)  TempSrc:   Oral Oral  SpO2: 99% 98% 98% 99%  Weight:    116.1 kg  Height:        Intake/Output Summary (Last 24 hours) at 02/10/2021 0803 Last data filed at 02/09/2021 2130 Gross per 24 hour  Intake 390 ml  Output --  Net 390 ml   Last 3 Weights 02/10/2021 02/09/2021 02/09/2021  Weight (lbs) 255 lb 14.4 oz  252 lb 252 lb  Weight (kg) 116.075 kg 114.306 kg 114.306 kg  Some encounter information is confidential and restricted. Go to Review Flowsheets activity to see all data.      Telemetry  Overnight telemetry shows sinus rhythm in the 80s, which I personally reviewed.   ECG  The most recent ECG shows sinus rhythm heart rate 77, first-degree AV block, inferior infarct, which I personally reviewed.   Physical Exam   Vitals:   02/09/21 0855 02/09/21 0905 02/09/21 0942 02/10/21 0522  BP: 123/71 134/72 122/61 (!) 129/96  Pulse: 77 79 84 86  Resp: (!) $RemoveB'23 20 16 20  'CfpyScQY$ Temp:   98 F (36.7 C) 98.4 F (36.9 C)  TempSrc:   Oral Oral  SpO2: 99% 98% 98% 99%  Weight:    116.1 kg  Height:        Intake/Output Summary (Last 24 hours) at 02/10/2021 0803 Last data filed at 02/09/2021 2130 Gross per 24 hour  Intake 390 ml  Output --  Net 390 ml    Last 3 Weights 02/10/2021 02/09/2021 02/09/2021  Weight (lbs) 255 lb 14.4 oz 252 lb 252 lb  Weight (kg) 116.075 kg 114.306 kg 114.306 kg  Some encounter information is confidential  and restricted. Go to Review Flowsheets activity to see all data.    Body mass index is 40.08 kg/m.   General: Well nourished, well developed, in no acute distress Head: Atraumatic, normal size  Eyes: PEERLA, EOMI  Neck: Supple, no JVD Endocrine: No thryomegaly Cardiac: Normal S1, S2; RRR; no murmurs, rubs, or gallops Lungs: Clear to auscultation bilaterally, no wheezing, rhonchi or rales  Abd: Soft, nontender, no hepatomegaly  Ext: No edema, pulses 2+ Musculoskeletal: No deformities, BUE and BLE strength normal and equal, tenderness to palpation to the right chest wall Skin: Warm and dry, no rashes   Neuro: Alert and oriented to person, place, time, and situation, CNII-XII grossly intact, no focal deficits  Psych: Normal mood and affect   Labs  High Sensitivity Troponin:   Recent Labs  Lab 01/29/21 1104 01/29/21 1304 02/07/21 1440 02/07/21 1717  TROPONINIHS 20*  18* 11 12     Cardiac EnzymesNo results for input(s): TROPONINI in the last 168 hours. No results for input(s): TROPIPOC in the last 168 hours.  Chemistry Recent Labs  Lab 02/08/21 0201 02/09/21 0323 02/10/21 0216  NA 140 138 136  K 3.1* 3.1* 3.2*  CL 96* 97* 99  CO2 $Re'30 30 28  'WaQ$ GLUCOSE 190* 109* 134*  BUN 54* 52* 51*  CREATININE 3.01* 2.60* 2.59*  CALCIUM 9.5 9.7 9.1  PROT 7.5  --   --   ALBUMIN 3.1* 3.1*  --   AST 30  --   --   ALT 31  --   --   ALKPHOS 72  --   --   BILITOT 0.7  --   --   GFRNONAA 17* 20* 20*  ANIONGAP $RemoveB'14 11 9    'nQgYGGzP$ Hematology Recent Labs  Lab 02/07/21 1440 02/08/21 0201  WBC 6.2 6.3  RBC 4.34 3.95  HGB 11.7* 10.6*  HCT 37.4 33.8*  MCV 86.2 85.6  MCH 27.0 26.8  MCHC 31.3 31.4  RDW 14.0 14.1  PLT 270 251   BNP Recent Labs  Lab 02/07/21 2105  BNP 74.1    DDimer No results for input(s): DDIMER in the last 168 hours.   Radiology  No results found.  Cardiac Studies  TTE 01/02/2021  1. No subcostal views; Elevated mean gradient across aortic valve (mean  gradient 13 mmHg) but visually valve opens well; S/P MV repair with mean  gradient 7 mmHg and trace MR; S/P TV repair with moderate TR.   2. Left ventricular ejection fraction, by estimation, is 60 to 65%. The  left ventricle has normal function. The left ventricle has no regional  wall motion abnormalities. There is moderate left ventricular hypertrophy.  There is the interventricular  septum is flattened in systole and diastole, consistent with right  ventricular pressure and volume overload.   3. Right ventricular systolic function is normal. The right ventricular  size is mildly enlarged.   4. Left atrial size was moderately dilated.   5. Right atrial size was mildly dilated.   6. The mitral valve has been repaired/replaced. Trivial mitral valve  regurgitation. No evidence of mitral stenosis.   7. The tricuspid valve is has been repaired/replaced. Tricuspid valve  regurgitation is  moderate.   8. The aortic valve is tricuspid. Aortic valve regurgitation is not  visualized. No aortic stenosis is present.   Patient Profile  Syndey Gallagher is a 65 y.o. female with systolic heart failure with recovery of ejection fraction, mitral valve repair tricuspid valve repair, persistent atrial fibrillation status  post maze, diabetes, obesity who was admitted on 02/07/2021 with sharp right-sided chest pain and recurrent atrial fibrillation.  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  Elevated ESR. 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  This is on the spectrum of pericarditis. -We will go ahead and treat her with steroids.  She has limited options for NSAIDs given her CKD.  We will pursue prednisone 40 mg for 3 days, 20 mg of prednisone for 3 days, 10 mg for 3 days, 5 mg for 3 days. -We will start her on low-dose colchicine 0.3 mg daily.  She can complete 3 months of this.  2.  Persistent atrial fibrillation status post maze -Status post cardioversion on 02/09/2021.  We will continue with oral amiodarone load.  400 mg twice daily for 7 days.  Then 200 mg daily. -She has had difficult to control A. fib.  Amiodarone may not be a good option for her given her extended age.  If her A. fib continues she should be considered for AV node ablation with pacemaker. -Continue Eliquis.  3.  Systolic heart failure with recovery of EF -No evidence of volume overload.  Continue home diuretic.  Most recent EF 60 to 65%.  [facility] will sign off.   Medication Recommendations: As above Other recommendations (labs, testing, etc): None Follow up as an outpatient: We will arrange 2 to 3-week follow-up  For questions or updates, please contact [place] Please consult [email or web address] for contact info under   Time Spent with Patient: I have spent a total of 35 minutes with patient reviewing hospital notes, telemetry,  EKGs, labs and examining the patient as well as establishing an assessment and plan that was discussed with the patient.  > 50% of time was spent in direct patient care.    Signed, Addison Naegeli. Audie Box, MD, [place]  02/10/2021 8:03 AM

## 2021-02-10 NOTE — TOC Initial Note (Addendum)
Transition of Care [facility]) - Initial/Assessment Note    Patient Details  Name: [name] MRN: [id number] Date of Birth: 12-18-1955  [facility] CM/SW Contact:    Bethena Roys, RN Phone Number: 02/10/2021, 10:19 AM  Clinical Narrative:  Risk for readmission assessment completed. Prior to arrival patient was from home with children. Patient will return home with home health services. Patient is currently active with [facility]. Liaison is aware that the patient will transition home today. Liaison is verifying home health services and will get back to the Case Manager for orders. No needs for durable medical equipment at this time. Patient has a rolling walker and bedside commode in the home. Patient has a primary care provider an she states she can get medications without issues. Medications should cost no more than $3.00. Patient has transportation home via private vehicle. No further needs from Case Manager at this time.     1142 02-10-21 Disciplines from [facility]: Registered Nurse, Aide, Physical Therapy, Social Worker             Expected Discharge Plan: [place] Barriers to Discharge: No Barriers Identified   Patient Goals and CMS Choice Patient states their goals for this hospitalization and ongoing recovery are:: to return home with home health services. CMS Medicare.gov Compare Post Acute Care list provided to:: Patient Choice offered to / list presented to : Patient  Expected Discharge Plan and Services Expected Discharge Plan: [place] In-house Referral: NA Discharge Planning Services: CM Consult Post Acute Care Choice: [place] arrangements for the past 2 months: Apartment Expected Discharge Date: 02/10/21                 DME Agency: NA       HH Arranged: PT HH Agency: [place] Date [place]: 02/10/21 Time HH Agency Contacted: 61 Representative  spoke with at [place]: [place] Arrangements/Services Living arrangements for the past 2 months: [place] with:: Self, Adult Children Patient language and need for interpreter reviewed:: Yes Do you feel safe going back to the place where you live?: Yes      Need for Family Participation in Patient Care: Yes (Comment) Care giver support system in place?: Yes (comment) Current home services: DME (Patient has rolling walker and Bedside commode.) Criminal Activity/Legal Involvement Pertinent to Current Situation/Hospitalization: No - Comment as needed  Activities of Daily Living Home Assistive Devices/Equipment: Bedside commode/3-in-1 ADL Screening (condition at time of admission) Patient's cognitive ability adequate to safely complete daily activities?: Yes Is the patient deaf or have difficulty hearing?: No Does the patient have difficulty seeing, even when wearing glasses/contacts?: No Does the patient have difficulty concentrating, remembering, or making decisions?: No Patient able to express need for assistance with ADLs?: Yes Does the patient have difficulty dressing or bathing?: No Independently performs ADLs?: Yes (appropriate for developmental age) Does the patient have difficulty walking or climbing stairs?: No Weakness of Legs: None Weakness of Arms/Hands: None  Permission Sought/Granted Permission sought to share information with : Family Supports, Customer service manager, Case Optician, dispensing granted to share information with : Yes, Verbal Permission Granted     Permission granted to share info w AGENCY: [place]        Emotional Assessment Appearance:: Appears stated age Attitude/Demeanor/Rapport: Engaged Affect (typically observed): Appropriate Orientation: : Oriented to Situation, Oriented to  Time, Oriented to Place, Oriented to Self Alcohol /  Substance Use: Not Applicable Psych Involvement: No (comment)  Admission  diagnosis:  Hypokalemia [E87.6] AKI (acute kidney injury) ([place]) [N17.9] Chest pain [R07.9] Nonspecific chest pain [R07.9] Patient Active Problem List   Diagnosis Date Noted   Acute exacerbation of CHF (congestive heart failure) ([place]) 01/29/2021   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   Encounter for preoperative dental examination    Teeth missing    Gingivitis    Accretions on teeth    CHF exacerbation ([place]) 12/21/2020   Acute on chronic heart failure ([place]) 12/19/2020   Atrial fibrillation, permanent ([place]) 11/02/2020   Chronic combined systolic and diastolic heart failure ([place]) 05/06/2020   NICM (nonischemic cardiomyopathy) ([place]) 04/01/2020   Family history of heart disease 04/01/2020   Mitral regurgitation 04/01/2020   Severe tricuspid regurgitation 04/01/2020   Closed right ankle fracture 09/24/2019   DKA, type 2 ([place]) 09/24/2019   HHNC (hyperglycemic hyperosmolar nonketotic coma) ([place]) 02/21/2019   Hypokalemia 02/21/2019   Acute kidney injury superimposed on CKD ([place]) 02/21/2019   Acute on chronic left systolic heart failure ([place]) 07/01/2018   Congestive heart failure with left ventricular diastolic dysfunction, acute ([place]) 07/01/2018   Right upper lobe pneumonia 05/14/2018   Atrial fibrillation with RVR ([place]) 05/14/2018   Chronic atrial fibrillation ([place]) 32/35/5732   Diastolic dysfunction 20/25/4270   Diabetes mellitus type 2 in obese ([place]) 12/26/2017   Nausea and vomiting    Chest pain 02/26/2012   OSA (obstructive sleep apnea) 09/21/2011   Hypoxia 09/21/2011   Diabetes mellitus ([place]) 02/04/2010   Morbid obesity ([place]) 02/04/2010   AF (paroxysmal atrial fibrillation) ([place]) 02/04/2010   Gastroparesis 02/04/2010   Acute gastroenteritis 02/04/2010   PCP:  [name], NP Pharmacy:   [id number] - [place], [address] [place] [address] Phone: [phone] Fax: [phone]  [facility] [address] [place] [address] Phone: [phone] Fax: [phone]    Readmission Risk Interventions Readmission Risk Prevention Plan 02/10/2021 01/11/2021 03/08/2020  Transportation Screening Complete Complete Complete  Medication Review Press photographer) Complete - Complete  PCP or Specialist appointment within 3-5 days of discharge Complete Complete Complete  HRI or Home Care Consult Complete Complete Complete  SW Recovery Care/Counseling Consult Complete Complete Complete  Palliative Care Screening Not Applicable Not Applicable Not [place] Not Applicable Not Applicable Not Applicable  Some recent data might be hidden

## 2021-02-10 NOTE — Progress Notes (Signed)
Pt is alert and oriented. Discharge instructions/ AVS given to pt. 

## 2021-02-10 NOTE — Discharge Summary (Signed)
Physician Discharge Summary  [name] TTS:[id number] DOB: Jan 11, 1956 DOA: 02/07/2021  PCP: [name], NP  Admit date: 02/07/2021 Discharge date: 02/10/2021  Admitted From: Home Disposition: Home   Recommendations for Outpatient Follow-up:  Follow up with PCP in 1-2 weeks with repeat BMP, CBC, ESR Follow up with cardiology in 2-3 weeks (they will arrange this)  Home Health: Resume PT, RN, CSW, aide Equipment/Devices: None new Discharge Condition: Stable CODE STATUS: Full Diet recommendation: Heart healthy, carb-modified  Brief/Interim Summary: [name] is a 65 y.o. female with medical history significant of chronic atrial fibrillation, CKD, combined systolic and diastolic heart failure, OSA, s/p MVR, TVR and maze procedure by Dr. Roxan Hockey in 12/2020 presents with complaints of substernal chest pain.  Pain waxes and wanes in intensity and radiates down her right arm.  Denies any change in chest pain symptoms with movement.  Reported associated symptoms of nausea, vomiting, abdominal pain, and lightheadedness.  Patient had just recently been hospitalized from 6/11-6/15 for chest pain and was noted to have acute exacerbation of combined systolic and diastolic congestive heart failure with acute kidney injury superimposed on chronic kidney disease.  During hospitalization.  Patient underwent cardioversion and was reported to be in sinus rhythm upon discharge.  During the hospitalization diuretics were held after right heart cath showed low filling pressures.  She is followed by cardiology and nephrology hospital stay, and she was started back on torsemide 40 mg daily which she reports taking as advised.  Despite doing this she reports that she has had worsening lower extremity swelling and weight gain of 1 to 2 pounds.  At home patient was not able to try anything for her symptoms and asked her family to call EMS.   In route with EMS patient was noted be in atrial  fibrillation at 120-130bpm.   Upon admission to the emergency department patient was seen to be afebrile with heart rates elevated up to 115, and all other vital signs maintained.  Labs significant for hemoglobin 11.7, potassium 2.7, BUN 55, creatinine 3.1, high-sensitivity troponin 11.  Chest x-ray noted cardiomegaly without any acute abnormality.  Patient was given metoprolol 5 mg IV x1 dose, potassium chloride 40 mEq p.o., and 10 mEq IV.   Cardiology was consulted and performed repeat DCCV after amiodarone infusion. She has maintained sinus rhythm. For ongoing chest discomfort, cardiology has recommended steroids and colchicine (see details below). The patient is stable for discharge.  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 Avoid NSAIDs with CKD. Not felt to be related to myocardial ischemia. - Per cardiology, prednisone 40 mg for 3 days, 20 mg of prednisone for 3 days, 10 mg for 3 days, 5 mg for 3 days. - Start low-dose colchicine 0.3 mg daily x 3 months.    PAF with recurrent AFib RVR s/p MAZE and despite DCCV: Repeat DCCV performed 6/22 after amiodarone load.   - Uninterrupted anticoagulation, continue eliquis.  - Continue amiodarone load per cardiology ($RemoveBeforeD'400mg'WOstxhyliBuvzZ$  po BID x7 days, then $RemoveBe'200mg'DKRNIjkhr$  po daily). If unsuccessful, may need evaluation for ablation/PPM.  - Continue metoprolol   AKI stage IV CKD: (based on current CrCl data). Improved. UA bland,  appreciate nephrology assistance (signed off).   T2DM: HbA1c 7.4% recently. - Continue home medications at discharge. Would recommend PCP follow up if CBGs are elevated with steroid taper.   Chronic HFpEF,  history of HFrEF (EF since recovered to 60-65%): - No overload, no changes to home medication/diuretic per cardiology.   HTN: - Continue metoprolol   HLD: - Continue rosuvastatin (ceiling 10mg  dose)   GERD: - Continue PPI   OSA: Not on home CPAP.    Hypokalemia: - Supplemented this AM. Continue monitoring at follow up.   Obesity: Body mass index is 40.08 kg/m.   Discharge Instructions Discharge Instructions     Diet - low sodium heart healthy   Complete by: As directed    Diet Carb Modified   Complete by: As directed    Discharge instructions   Complete by: As directed    Per cardiology, please take amiodarone with a higher dose for 7 days (400mg  twice daily) then return to 200mg  daily.  To help with inflammatory pain, take colchicine daily for 3 months and take a taper of prednisone as prescribed.   A new glucometer has been sent to your pharmacy as well.   Follow up with cardiology as already scheduled prior to discharge, or seek medical attention right away if your symptoms return.   Increase activity slowly   Complete by: As directed    No wound care   Complete by: As directed       Allergies as of 02/10/2021       Reactions   Sulfa Antibiotics Itching        Medication List     TAKE these medications    Accu-Chek Guide Me w/Device Kit Use as instructed to check blood sugar three times daily. E11.69   OneTouch Verio w/Device Kit Use to check blood sugar TID. E11.69   Accu-Chek Softclix Lancets lancets Use as instructed to check blood sugar three times daily. H47.65   OneTouch Delica Lancets 46T Misc Use to check blood sugar TID.  E11.69   albuterol 108 (90 Base) MCG/ACT inhaler Commonly known as: VENTOLIN HFA Inhale 2 puffs into the lungs every 4 (four) hours as needed for wheezing or shortness of breath.   amiodarone 200 MG tablet Commonly known as: Pacerone Take 2 tablets (400 mg total) by mouth 2 (two) times daily for 7 days, THEN 1  tablet (200 mg total) daily for 23 days. Start taking on: February 10, 2021 What changed: See the new instructions.   apixaban 5 MG Tabs tablet Commonly known as: Eliquis Take 1 tablet (5 mg total) by mouth 2 (two) times daily.   B-D SINGLE USE SWABS REGULAR Pads 1 each by Other route daily.   B-D ULTRAFINE III SHORT PEN 31G X 8 MM Misc Generic drug: Insulin Pen Needle 1 each by Other route in the morning, at noon, in the evening, and at bedtime.   blood glucose meter kit and supplies Kit Dispense based on patient and insurance preference. Use up to four times daily as directed.   colchicine 0.6 MG tablet Take 0.5 tablets (0.3 mg total) by mouth daily. Start taking on: February 11, 2021   DULoxetine 60 MG capsule Commonly known as: CYMBALTA Take 1 capsule (60 mg total) by mouth daily.   EPINEPHrine 0.3 mg/0.3 mL Soaj injection Commonly known as: EPI-PEN Inject 0.3 mg into the muscle as needed for anaphylaxis.   insulin aspart 100 UNIT/ML injection Commonly known as: NovoLOG Inject 12 Units into  the skin 3 (three) times daily before meals. For blood sugars 0-199 give 0 units of insulin, 201-250 give 4 units, 251-300 give 6 units, 301-350 give 8 units, 351-400 give 10 units,> 400 give 12 units and call M.D.   Levemir 100 UNIT/ML injection Generic drug: insulin detemir INJECT 0.4 MLS (40 UNITS TOTAL) INTO THE SKIN 2 (TWO) TIMES DAILY. What changed: See the new instructions.   metoprolol succinate 25 MG 24 hr tablet Commonly known as: TOPROL-XL Take 1 tablet (25 mg total) by mouth daily.   MULTIVITAMIN WOMEN PO Take 1 tablet by mouth daily.   nitroGLYCERIN 0.4 MG SL tablet Commonly known as: NITROSTAT Place 1 tablet (0.4 mg total) under the tongue every 5 (five) minutes x 3 doses as needed for chest pain.   OneTouch Verio test strip Generic drug: glucose blood Use to check blood sugar TID. E11.69   oxyCODONE 5 MG immediate release tablet Commonly known as: Oxy  IR/ROXICODONE Take 1 tablet (5 mg total) by mouth every 4 (four) hours as needed for severe pain.   pantoprazole 40 MG tablet Commonly known as: PROTONIX Take 40 mg by mouth 2 (two) times daily.   Potassium Chloride ER 20 MEQ Tbcr Take 20 mEq by mouth daily.   predniSONE 10 MG tablet Commonly known as: DELTASONE Take 4 tablets (40 mg total) by mouth daily with breakfast for 3 days, THEN 2 tablets (20 mg total) daily with breakfast for 3 days, THEN 1 tablet (10 mg total) daily with breakfast for 3 days, THEN 0.5 tablets (5 mg total) daily with breakfast for 4 days. Start taking on: February 11, 2021   Restasis 0.05 % ophthalmic emulsion Generic drug: cycloSPORINE Place 1 drop into both eyes 2 (two) times daily.   rosuvastatin 10 MG tablet Commonly known as: CRESTOR Take 1 tablet (10 mg total) by mouth daily.   torsemide 20 MG tablet Commonly known as: Demadex Take 2 tablets (40 mg total) by mouth daily.   Trulicity 1.5 MV/6.7MC Sopn Generic drug: Dulaglutide INJECT 1.5 MG INTO THE SKIN ONCE A WEEK.        Follow-up Information     [facility] Follow up.   Specialty: Cardiology Why: Advanced Heart Failure Clinic - keep follow-up scheduled on Wednesday February 23, 2021 at 9:30 AM. Please arrive 15 minutes early to check in. Contact information: [address] [phone] [place], [place] Follow up.   Specialty: Home Health Services Why: Physical Therapy, Registered Nurse, Aide, Social Work-office to call with a visit time. Contact information: [address] [place] [address] [phone]                Allergies  Allergen Reactions   Sulfa Antibiotics Itching    Consultations: Cardiology  Procedures/Studies: DG Chest 2 View  Result Date: 02/07/2021 CLINICAL DATA:  Chest pain EXAM: CHEST - 2 VIEW COMPARISON:  01/29/2021 FINDINGS:  Post sternotomy changes with mitral and tricuspid valve repair and left atrial appendage clipping. Cardiomegaly with aortic atherosclerosis. No focal opacity, pleural effusion, or pneumothorax. IMPRESSION: No active cardiopulmonary disease.  Cardiomegaly. Electronically Signed   By: Donavan Foil M.D.   On: 02/07/2021 15:35   CARDIAC CATHETERIZATION  Result Date: 01/31/2021 Findings: RA = 5 RV = 33/2 PA = 35/13 (22) PCW = 13 Fick cardiac output/index = 4.3/2.0 PVR = 2.1 WU FA sat = 97%  PA sat = 54%, 54% Assessment: 1. Low filling pressures with moderately reduced CO. Plan/Discussion: Would hold diuretics for now. Hopefully CO will improve with DC-CV. Glori Bickers, MD 2:48 PM  DG Chest Portable 1 View  Result Date: 01/29/2021 CLINICAL DATA:  Chest pain.  Recent open heart surgery EXAM: PORTABLE CHEST 1 VIEW COMPARISON:  01/09/2021 FINDINGS: Cardiomegaly. Prior mitral and tricuspid valve repair. Left atrial clipping. There is no edema, consolidation, effusion, or pneumothorax. IMPRESSION: Stable postoperative chest.  No evidence of active disease. Electronically Signed   By: Monte Fantasia M.D.   On: 01/29/2021 11:32    DCCV  Subjective: Feels well, in street clothes and ready to go after clearance by cardiology. Pain controlled, no palpitations or dyspnea.   Discharge Exam: Vitals:   02/10/21 0522 02/10/21 1013  BP: (!) 129/96 (!) 114/58  Pulse: 86 80  Resp: 20   Temp: 98.4 F (36.9 C)   SpO2: 99%    General: Pt is alert, awake, not in acute distress Cardiovascular: RRR, S1/S2 +, no rubs, no gallops Respiratory: CTA bilaterally, no wheezing, no rhonchi Abdominal: Soft, NT, ND, bowel sounds + Extremities: No edema, no cyanosis  Labs: BNP (last 3 results) Recent Labs    12/19/20 1016 01/29/21 1104 02/07/21 2105  BNP 227.9* 33.6 16.1   Basic Metabolic Panel: Recent Labs  Lab 02/07/21 1440 02/07/21 2105 02/08/21 0201 02/09/21 0323 02/10/21 0216  NA 135  --  140 138 136   K 2.7*  --  3.1* 3.1* 3.2*  CL 93*  --  96* 97* 99  CO2 31  --  $R'30 30 28  'cX$ GLUCOSE 165*  --  190* 109* 134*  BUN 55*  --  54* 52* 51*  CREATININE 3.10*  --  3.01* 2.60* 2.59*  CALCIUM 9.4  --  9.5 9.7 9.1  MG  --  1.8  --   --  1.8  PHOS  --   --   --  4.0  --    Liver Function Tests: Recent Labs  Lab 02/08/21 0201 02/09/21 0323  AST 30  --   ALT 31  --   ALKPHOS 72  --   BILITOT 0.7  --   PROT 7.5  --   ALBUMIN 3.1* 3.1*   No results for input(s): LIPASE, AMYLASE in the last 168 hours. No results for input(s): AMMONIA in the last 168 hours. CBC: Recent Labs  Lab 02/07/21 1440 02/08/21 0201  WBC 6.2 6.3  HGB 11.7* 10.6*  HCT 37.4 33.8*  MCV 86.2 85.6  PLT 270 251   Cardiac Enzymes: No results for input(s): CKTOTAL, CKMB, CKMBINDEX, TROPONINI in the last 168 hours. BNP: Invalid input(s): POCBNP CBG: Recent Labs  Lab 02/09/21 1159 02/09/21 1615 02/09/21 2119 02/10/21 0743 02/10/21 1126  GLUCAP 232* 186* 193* 129* 149*   D-Dimer No results for input(s): DDIMER in the last 72 hours. Hgb A1c No results for input(s): HGBA1C in the last 72 hours. Lipid Profile No results for input(s): CHOL, HDL, LDLCALC, TRIG, CHOLHDL, LDLDIRECT in the last 72 hours. Thyroid function studies No results for input(s): TSH, T4TOTAL, T3FREE, THYROIDAB in the last 72 hours.  Invalid input(s): FREET3 Anemia work up No results for input(s): VITAMINB12, FOLATE, FERRITIN, TIBC, IRON, RETICCTPCT in the last 72 hours. Urinalysis    Component Value Date/Time   COLORURINE YELLOW 02/08/2021 1125   APPEARANCEUR CLEAR 02/08/2021 1125   LABSPEC 1.013 02/08/2021 1125   PHURINE 5.0 02/08/2021 1125   GLUCOSEU NEGATIVE  02/08/2021 1125   HGBUR SMALL (A) 02/08/2021 1125   BILIRUBINUR NEGATIVE 02/08/2021 1125   BILIRUBINUR negative 09/24/2019 1742   KETONESUR NEGATIVE 02/08/2021 1125   PROTEINUR NEGATIVE 02/08/2021 1125   UROBILINOGEN 1.0 09/24/2019 1742   UROBILINOGEN 1.0 10/26/2014 0249    NITRITE NEGATIVE 02/08/2021 1125   LEUKOCYTESUR NEGATIVE 02/08/2021 1125    Microbiology Recent Results (from the past 240 hour(s))  SARS CORONAVIRUS 2 (TAT 6-24 HRS) Nasopharyngeal Nasopharyngeal Swab     Status: None   Collection Time: 02/07/21  5:16 PM   Specimen: Nasopharyngeal Swab  Result Value Ref Range Status   SARS Coronavirus 2 NEGATIVE NEGATIVE Final    Comment: (NOTE) SARS-CoV-2 target nucleic acids are NOT DETECTED.  The SARS-CoV-2 RNA is generally detectable in upper and lower respiratory specimens during the acute phase of infection. Negative results do not preclude SARS-CoV-2 infection, do not rule out co-infections with other pathogens, and should not be used as the sole basis for treatment or other patient management decisions. Negative results must be combined with clinical observations, patient history, and epidemiological information. The expected result is Negative.  Fact Sheet for Patients: SugarRoll.be  Fact Sheet for Healthcare Providers: https://www.woods-mathews.com/  This test is not yet approved or cleared by the [place] FDA and  has been authorized for detection and/or diagnosis of SARS-CoV-2 by FDA under an Emergency Use Authorization (EUA). This EUA will remain  in effect (meaning this test can be used) for the duration of the COVID-19 declaration under Se ction 564(b)(1) of the Act, 21 U.S.C. section 360bbb-3(b)(1), unless the authorization is terminated or revoked sooner.  Performed at [facility], [place] [address] [place], [place] [address]     Time coordinating discharge: Approximately 40 minutes  Patrecia Pour, MD  [facility] 02/10/2021, 11:49 AM

## 2021-02-11 ENCOUNTER — Telehealth: Payer: Self-pay

## 2021-02-11 NOTE — CV Procedure (Signed)
    DIRECT CURRENT CARDIOVERSION  NAME:  [name]   MRN: [id number] DOB:  [birth year]   ADMIT DATE: 02/07/2021   INDICATIONS: Atrial fibrillation    PROCEDURE:   Informed consent was obtained prior to the procedure. The risks, benefits and alternatives for the procedure were discussed and the patient comprehended these risks. Once an appropriate time out was taken, the patient had the defibrillator pads placed in the anterior and posterior position. The patient then underwent sedation by the anesthesia service. Once an appropriate level of sedation was achieved, the patient received a single biphasic, synchronized 200J shock with prompt conversion to sinus rhythm. No apparent complications.  Glori Bickers, MD  11:55 PM

## 2021-02-11 NOTE — Telephone Encounter (Signed)
Transition Care Management Unsuccessful Follow-up Telephone Call  Date of discharge and from where:  [facility] on 02/10/2021  Attempts:  1st Attempt  Reason for unsuccessful TCM follow-up call:  Unable to reach patient or leave VM at this time. SMS notification sent.   Pt have scheduled appt with NP Juluis Mire on 02/15/2021 for HFU

## 2021-02-14 ENCOUNTER — Telehealth: Payer: Self-pay

## 2021-02-14 ENCOUNTER — Telehealth (INDEPENDENT_AMBULATORY_CARE_PROVIDER_SITE_OTHER): Payer: Self-pay | Admitting: Primary Care

## 2021-02-14 NOTE — Telephone Encounter (Signed)
[name] with [facility] called for Pt 2 times a week for 5 weeks.  CB#  [phone]

## 2021-02-14 NOTE — Telephone Encounter (Signed)
Returned call, left VM

## 2021-02-14 NOTE — Telephone Encounter (Signed)
Transition Care Management Follow-up Telephone Call Date of discharge and from where: 02/10/2021, [facility]  How have you been since you were released from the hospital? She said she is feeling all right Any questions or concerns? No  Items Reviewed: Did the pt receive and understand the discharge instructions provided? Yes  Medications obtained and verified? Yes  - she said she has all medications and did not have any questions about her med regime.  Other? No  Any new allergies since your discharge? No  Do you have support at home? Yes   Home Care and Equipment/Supplies: Were home health services ordered? yes If so, what is the name of the agency? [facility]  Has the agency set up a time to come to the patient's home? Yes- the nurse saw her yesterday and a therapist is coming to see her today.  Were any new equipment or medical supplies ordered?  Yes: glucometer What is the name of the medical supply agency? [facility]  Were you able to get the supplies/equipment? yes Do you have any questions related to the use of the equipment or supplies? No  Functional Questionnaire: (I = Independent and D = Dependent) ADLs: independent, uses cane with ambulation.  Has walker to use if needed   Follow up appointments reviewed:  PCP Hospital f/u appt confirmed? Yes  Scheduled to see Juluis Mire, NP  on 02/15/2021. [facility] f/u appt confirmed? Yes  Scheduled to see CTS - 02/22/2021, HF clinic - 02/23/2021 Are transportation arrangements needed?  Not discussed  If their condition worsens, is the pt aware to call PCP or go to the Emergency Dept.? Yes Was the patient provided with contact information for the PCP's office or ED? Yes Was to pt encouraged to call back with questions or concerns? Yes

## 2021-02-14 NOTE — Telephone Encounter (Signed)
Returned call , VM left.

## 2021-02-14 NOTE — Telephone Encounter (Signed)
Hermenia Fiscal RN with [facility] is calling with Skilled Nursing for resumption of care Frequency - 2 w 2, 1 w 3, 2 prn.  CB- [phone]

## 2021-02-15 ENCOUNTER — Ambulatory Visit (INDEPENDENT_AMBULATORY_CARE_PROVIDER_SITE_OTHER): Payer: Medicare Other | Admitting: Primary Care

## 2021-02-15 ENCOUNTER — Encounter (INDEPENDENT_AMBULATORY_CARE_PROVIDER_SITE_OTHER): Payer: Self-pay | Admitting: Primary Care

## 2021-02-15 ENCOUNTER — Other Ambulatory Visit: Payer: Self-pay

## 2021-02-15 VITALS — BP 127/83 | HR 78 | Temp 97.3°F | Resp 12 | Wt 265.0 lb

## 2021-02-15 DIAGNOSIS — I10 Essential (primary) hypertension: Secondary | ICD-10-CM

## 2021-02-15 DIAGNOSIS — F419 Anxiety disorder, unspecified: Secondary | ICD-10-CM | POA: Diagnosis not present

## 2021-02-15 DIAGNOSIS — Z09 Encounter for follow-up examination after completed treatment for conditions other than malignant neoplasm: Secondary | ICD-10-CM

## 2021-02-15 DIAGNOSIS — N183 Chronic kidney disease, stage 3 unspecified: Secondary | ICD-10-CM

## 2021-02-15 DIAGNOSIS — I5042 Chronic combined systolic (congestive) and diastolic (congestive) heart failure: Secondary | ICD-10-CM

## 2021-02-15 DIAGNOSIS — Z794 Long term (current) use of insulin: Secondary | ICD-10-CM | POA: Diagnosis not present

## 2021-02-15 DIAGNOSIS — E1122 Type 2 diabetes mellitus with diabetic chronic kidney disease: Secondary | ICD-10-CM

## 2021-02-15 DIAGNOSIS — G4700 Insomnia, unspecified: Secondary | ICD-10-CM

## 2021-02-15 LAB — POCT CBG (FASTING - GLUCOSE)-MANUAL ENTRY: Glucose Fasting, POC: 151 mg/dL — AB (ref 70–99)

## 2021-02-15 MED ORDER — ALPRAZOLAM 0.5 MG PO TABS: 0.5000 mg | ORAL_TABLET | Freq: Two times a day (BID) | ORAL | 0 refills | Status: DC | PRN

## 2021-02-15 NOTE — Progress Notes (Signed)
Hospital f/u Tingling and numbness in feet and hands Onset greater than 1 mth.   Pain-  Chest Knees Lower back

## 2021-02-15 NOTE — Progress Notes (Signed)
A1C- 01/29/2021- 7.4

## 2021-02-15 NOTE — Patient Instructions (Signed)
Depression can affect your thoughts and feelings, relationships, daily activities, and physical health. It is caused by changes in the way your brain functions. If you receive a diagnosis of depression, your health care provider will tell you which type of depression you have and what treatment options are available to you.

## 2021-02-15 NOTE — Progress Notes (Signed)
[facility]   Subjective:   [name] is a 65 y.o. morbid obese female who presents for hospital follow up .Patient initially went for chest pain to the ED -via EMS admit date to the hospital was 02/07/21, patient was discharged from the hospital on 02/10/21, patient was admitted ATF:TDDUK pain, Diabetes mellitus ([place]), AF (paroxysmal atrial fibrillation) ([place]), Nausea and vomiting, Acute kidney injury superimposed on CKD ([place]) and S/P mitral valve repair. Glad to be out of the hospital but my heart is heavy her niece has cancer with 3 kids oncology giving her 2 weeks to live- this is her sister only daughter.     Past Medical History:  Diagnosis Date   Allergic rhinitis    Arthritis    Asthma    Chest pain 12/27/2017   Chronic diastolic CHF (congestive heart failure) (HCC)    COPD (chronic obstructive pulmonary disease) (HCC)    Depression    DM (diabetes mellitus) ([place])    DVT (deep vein thrombosis) in pregnancy    Dysrhythmia    atrial fibrilation   GERD (gastroesophageal reflux disease)    Headache(784.0)    HTN (hypertension)    Hyperlipidemia    Obesity    Pneumonia 04/2018   RIGHT LOBE   Shortness of breath    Sleep apnea    compliant with CPAP     Allergies  Allergen Reactions   Sulfa Antibiotics Itching      Current Outpatient Medications on File Prior to Visit  Medication Sig Dispense Refill   amiodarone (PACERONE) 200 MG tablet Take 2 tablets (400 mg total) by mouth 2 (two) times daily for 7 days, THEN 1 tablet (200 mg total) daily for 23 days. 51 tablet 0   colchicine 0.6 MG tablet Take 0.5 tablets (0.3 mg total) by mouth daily. 90 tablet 0   LEVEMIR 100 UNIT/ML injection INJECT 0.4 MLS (40 UNITS TOTAL) INTO THE SKIN 2 (TWO) TIMES DAILY. 60 mL 1   nitroGLYCERIN (NITROSTAT) 0.4 MG SL tablet Place 1 tablet (0.4 mg total) under the tongue every 5 (five) minutes x 3 doses as needed for chest pain. 25 tablet 12   oxyCODONE (OXY  IR/ROXICODONE) 5 MG immediate release tablet Take 1 tablet (5 mg total) by mouth every 4 (four) hours as needed for severe pain. 30 tablet 0   Potassium Chloride ER 20 MEQ TBCR Take 20 mEq by mouth daily. 30 tablet 6   predniSONE (DELTASONE) 10 MG tablet Take 10 mg by mouth 2 (two) times daily with a meal.     rosuvastatin (CRESTOR) 10 MG tablet Take 1 tablet (10 mg total) by mouth daily. 90 tablet 1   torsemide (DEMADEX) 20 MG tablet Take 2 tablets (40 mg total) by mouth daily. (Patient taking differently: Take 40 mg by mouth 3 (three) times daily.) 120 tablet 3   Accu-Chek Softclix Lancets lancets Use as instructed to check blood sugar three times daily. E11.69 100 each 12   albuterol (VENTOLIN HFA) 108 (90 Base) MCG/ACT inhaler Inhale 2 puffs into the lungs every 4 (four) hours as needed for wheezing or shortness of breath.      Alcohol Swabs (B-D SINGLE USE SWABS REGULAR) PADS 1 each by Other route daily.     apixaban (ELIQUIS) 5 MG TABS tablet Take 1 tablet (5 mg total) by mouth 2 (two) times daily. (Patient not taking: Reported on 02/15/2021) 180 tablet 1   B-D ULTRAFINE III SHORT PEN 31G X 8 MM MISC  1 each by Other route in the morning, at noon, in the evening, and at bedtime.     blood glucose meter kit and supplies KIT Dispense based on patient and insurance preference. Use up to four times daily as directed. 1 each 0   Blood Glucose Monitoring Suppl (ACCU-CHEK GUIDE ME) w/Device KIT Use as instructed to check blood sugar three times daily. E11.69 1 kit 0   Blood Glucose Monitoring Suppl (ONETOUCH VERIO) w/Device KIT Use to check blood sugar TID. E11.69 1 kit 0   DULoxetine (CYMBALTA) 60 MG capsule Take 1 capsule (60 mg total) by mouth daily. (Patient not taking: Reported on 02/15/2021) 90 capsule 3   EPINEPHrine 0.3 mg/0.3 mL IJ SOAJ injection Inject 0.3 mg into the muscle as needed for anaphylaxis. (Patient not taking: Reported on 02/15/2021) 1 each 1   glucose blood (ONETOUCH VERIO) test  strip Use to check blood sugar TID. E11.69 100 each 6   insulin aspart (NOVOLOG) 100 UNIT/ML injection Inject 12 Units into the skin 3 (three) times daily before meals. For blood sugars 0-199 give 0 units of insulin, 201-250 give 4 units, 251-300 give 6 units, 301-350 give 8 units, 351-400 give 10 units,> 400 give 12 units and call M.D. (Patient not taking: Reported on 02/15/2021) 10 mL 3   metoprolol succinate (TOPROL-XL) 25 MG 24 hr tablet Take 1 tablet (25 mg total) by mouth daily. (Patient not taking: Reported on 02/15/2021) 30 tablet 0   Multiple Vitamins-Minerals (MULTIVITAMIN WOMEN PO) Take 1 tablet by mouth daily.  (Patient not taking: Reported on 02/15/2021)     OneTouch Delica Lancets 82U MISC Use to check blood sugar TID.  E11.69 100 each 6   pantoprazole (PROTONIX) 40 MG tablet Take 40 mg by mouth 2 (two) times daily. (Patient not taking: Reported on 02/15/2021)     predniSONE (DELTASONE) 10 MG tablet Take 4 tablets (40 mg total) by mouth daily with breakfast for 3 days, THEN 2 tablets (20 mg total) daily with breakfast for 3 days, THEN 1 tablet (10 mg total) daily with breakfast for 3 days, THEN 0.5 tablets (5 mg total) daily with breakfast for 4 days. (Patient not taking: Reported on 02/15/2021) 23 tablet 0   RESTASIS 0.05 % ophthalmic emulsion Place 1 drop into both eyes 2 (two) times daily.  12   TRULICITY 1.5 MP/5.3IR SOPN INJECT 1.5 MG INTO THE SKIN ONCE A WEEK. 0.5 mL 1   No current facility-administered medications on file prior to visit.   Review of System: Review of Systems  Respiratory:  Positive for shortness of breath.        With exertion   Cardiovascular:  Positive for chest pain and leg swelling.  Gastrointestinal:  Positive for constipation.       Resolved with OTC laxative eating more greens and vegtables   Musculoskeletal:  Positive for back pain.  Neurological:  Positive for tingling and weakness.  Psychiatric/Behavioral:  Positive for depression.   All other systems  reviewed and are negative.  Objective:  BP 127/83 (BP Location: Left Arm, Patient Position: Sitting, Cuff Size: Large)   Pulse 78   Temp (!) 97.3 F (36.3 C)   Resp 12   Wt 265 lb (120.2 kg)   SpO2 98%   BMI 41.50 kg/m   Filed Weights   02/15/21 0933  Weight: 265 lb (120.2 kg)    Physical Exam: General Appearance: Well nourished, morbid obesity in no apparent distress. Eyes: PERRLA, EOMs, conjunctiva no swelling  or erythema Sinuses: No Frontal/maxillary tenderness ENT/Mouth: Ext aud canals clear, TMs without erythema, bulging.  Hearing normal.  Neck: Supple, thyroid normal.  Respiratory: Respiratory effort normal, BS equal bilaterally without rales, rhonchi, wheezing or stridor.  Cardio: RRR with no MRGs. Brisk peripheral pulses without edema.  Abdomen: Soft, + BS.  Non tender, no guarding, rebound, hernias, masses. Lymphatics: Non tender without lymphadenopathy.  Musculoskeletal: Full ROM, 5/5 strength, normal gait.  Skin: Warm, dry without rashes, lesions, ecchymosis.  Neuro: Cranial nerves intact. Normal muscle tone, no cerebellar symptoms. Sensation intact.  Psych: Awake and oriented X 3, normal affect, Insight and Judgment appropriate.    Assessment:   Diagnoses and all orders for this visit:  Type 2 diabetes mellitus with stage 3 chronic kidney disease, with long-term current use of insulin, unspecified whether stage 3a or 3b CKD (HCC) -     Glucose (CBG), Fasting  Goal of therapy: Less than 6.5 hemoglobin A1c. Continue to foods that are high in carbohydrates are the following rice, potatoes, breads, sugars, and pastas.  Reduction in the intake (eating) will assist in lowering your blood sugars.   Essential hypertension Counseled on blood pressure goal of less than 130/80, low-sodium, DASH diet, medication compliance, 150 minutes of moderate intensity exercise per week. Discussed medication compliance, adverse effects.   Chronic combined systolic and diastolic  heart failure ([place]) Followed by cardiology   Hospital discharge follow-up Follow up with [place] (Cardiology); [facility] - keep follow-up scheduled on Wednesday February 23, 2021 at 9:30 AM. Please arrive 15 minutes early to check in. Follow up with Health, [place] ([place]); Physical Therapy, Registered Nurse, Aide, Social Work-office to call with a visit time. Anxiety See HPI ALPRAZolam (XANAX) 0.5 MG tablet; Take 1 tablet (0.5 mg total) by mouth 2 (two) times daily as needed for anxiety.( Short term only)   Insomnia, unspecified type Situational and may take xanax at bedtime   Other orders -     ALPRAZolam (XANAX) 0.5 MG tablet; Take 1 tablet (0.5 mg total) by mouth 2 (two) times daily as needed for anxiety.   No orders of the defined types were placed in this encounter.   This note has been created with Surveyor, quantity. Any transcriptional errors are unintentional.   [name], NP 02/15/2021, 9:49 AM

## 2021-02-16 ENCOUNTER — Other Ambulatory Visit: Payer: Self-pay | Admitting: Thoracic Surgery (Cardiothoracic Vascular Surgery)

## 2021-02-16 ENCOUNTER — Telehealth (INDEPENDENT_AMBULATORY_CARE_PROVIDER_SITE_OTHER): Payer: Self-pay

## 2021-02-16 DIAGNOSIS — Z9889 Other specified postprocedural states: Secondary | ICD-10-CM

## 2021-02-16 NOTE — Telephone Encounter (Signed)
Copied from [place] [id number]. Topic: General - Inquiry >> Feb 16, 2021  3:50 PM Oneta Rack wrote: [place] Human name: Kallie Locks Relation to pt: Social Worker from [employer] back number: [phone]   Reason for call:  Social worker visit is delayed until next week as per patient request

## 2021-02-16 NOTE — Telephone Encounter (Signed)
Copied from [place] [id number]. Topic: General - Other >> Feb 15, 2021 11:02 AM Bayard Beaver wrote: Reason for CRM: Terri from sleep center at [facility] long, called to inform us that patients ahi wasn't high enough in December when she had the sleep study done and also doesn't quality now for one and needs to be changed to split night study from cpap titration study.  Please advice

## 2021-02-17 ENCOUNTER — Other Ambulatory Visit (INDEPENDENT_AMBULATORY_CARE_PROVIDER_SITE_OTHER): Payer: Self-pay | Admitting: Primary Care

## 2021-02-17 DIAGNOSIS — R0902 Hypoxemia: Secondary | ICD-10-CM

## 2021-02-17 NOTE — Telephone Encounter (Signed)
Please see message. °

## 2021-02-17 NOTE — Telephone Encounter (Signed)
Order placed

## 2021-02-17 NOTE — Telephone Encounter (Signed)
Shelton Silvas, from diabetes store, called stating that they still have not received this refax. She is requesting to have this done so that the pt can receive the shoes. Please advise.      Callback # [phone]

## 2021-02-17 NOTE — Telephone Encounter (Signed)
Called number list listing of options none for DM shoes. Filled this out at least 3 times

## 2021-02-21 NOTE — Progress Notes (Signed)
Advanced Heart Failure Clinic Note   PCP: [name], NP PCP-Cardiologist: Buford Dresser, MD  [facility]: Dr. 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   HPI:  [name] is a 65 year old female with morbid obesity, chronic diastolic and systolic HF with mildly reduced LVEF (45-50%), severe MR/TR, atrial fibrillation on eliquis, HTN, and DMII who is followed by Dr. Harrell Gave as an out-patient. She has a known history of HF with mildly reduced LVEF of 45-50% with cath in 2019 with mild, non-obstructive CAD. Also with history of difficult to control Afib with failed Tikosyn, later switched to amiodarone, dig, metop and apixaban for [facility]. Saw Dr. Harrell Gave in clinic on 11/22/20 where she was experiencing worsening SOB, weight gain, LE edema and chest pressure. She declined ER visit at that time. She was changed from lasix to torsemide for diuresis. Also planned for CV surgery evaluation for severe MR.    Admitted 12/19/20 with worsening HF.  LHC 5/19 non-obstructive CAD. RHC 12/22/20 with elevated filling pressures and low CI.    Underwent MV/TV repair with Maze on 12/30/20. Post-operatively, she struggled  with low co-ox, progressive renal failure and volume overload. AHF team was consulted to assist w/ further management. Post operative Echo showed LVEF 55% RV moderately HK. Moderate TR. No significant MR. No effusion. Hemodynamics improved w/ milrinone. Suspected of having post-op ATN. Nephrology assisted w/ transition back to PO diuretics. She was discharged on 5/28. D/w wt was 281 lb. SCr was 2.8. Nephrology recommended home on torsemide 60 mg daily. She was also in Afib day of d/c. Recommendations were to consider DC-CV in several weeks after atrial irritation settles down from Maze.   She was seen in clinic 01/25/21 for post hospital f/u  w/ daughter. Feels bad. Complaints of exertional dyspnea,  nausea/ vomiting, poor appetite and abdominal discomfort. Remains in Afib on EKG, HR 100. BP soft but no orthostatic symptoms. ReDs clip 42%. Plan for DCCV.  She was seen in ED 01/29/21 with SOB and CP, with decreased urinary output. She was admitted for A/CHF, diuresed with IV lasix, RHC showed low filling pressures and moderately reduced CO. She underwent successful DCCV 02/01/21. 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 She was discharged with CR referral and instructions to follow up with nephrology.  Seen in ED 02/07/21 for leg swelling & CP. She was back in Afib, rate controlled. She was loaded w amiodarone gtt, repeat successful DCCV on 02/09/21. 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   Today she returns for post DCCV and HF follow up. She underwent successful DCCV 02/10/21 but not back in atrial fibrillation. Says she felt ok for longer this time after cardioversion. SOB with minimal activity. Uses electric cart at grocery store. Still having incisional chest pain from sternotomy. Denies increasing CP, dizziness, or PND/Orthopnea. Appetite ok. No fever or chills. Weight at home 270 pounds. Taking all medications, however only taking tosemide 20 mg bid. 255 lbs at discharge 2 weeks ago.  Cardiac Studies - Echo (11/13): EF 60-65%, moderate TR - Echo (11/14): EF 60-65%, severe TR, PA peak pressure 47 mmHg - Echo (5/15): EF 50-55% - Exercise stress test (7/6): A. Fib. with controlled vent. response OLD inferior MI and non sp. T wave changes at baseline. - Echo (8/17):  EF 50-55%, moderate MR - Exercise stress test (8/17): Atrial Fib. Q wave in lead 3 and AVF with nonsp T wave changes at baseline - Echo (5/19): EF 55-60%, moderate MR - Exercise stress test (5/19): Afib, normal. - LHC (5/19): Prox LAD lesion is 10% stenosed, Dist LM to Ost LAD  lesion is 10% stenosed, Ost Cx lesion is 15% stenosed, LV systolic function normal, LVEDP is normal. - Echo (11/19): EF 45-50%, severe MR/TR - Echo (11/21): EF 45-50%, severe MR (rheumatic)/TR - Echo (4/22): EF 45-50%, severe MR mean gradient 2.0 mmHg, severe TR - Echo (5/22): EF 60-65%, s/p MVR, trace MR mean gradient 7.0 mmHg, moderate TR - RHC (5/22): Elevated R > L heart filling pressures with PAPi low but not markedly so (1.8) suggestive of a significant component of RV dysfunction. Prominent v-waves in PCWP and RA pressure tracings suggestive of significant MR and TR. Cardiac index low. - RHC (6/22): low filling pressures, moderate reduced CO.   RA = 5 RV = 33/2 PA = 35/13 (22) PCW = 13 Fick cardiac output/index = 4.3/2.0 PVR = 2.1 WU FA sat = 97% PA sat = 54%, 54% - DCCV (02/01/21): successful conversion to NSR. - DCCV (02/09/21): successful conversion to NSR.  ROS: All systems reviewed and negative except as per HPI.   Past Medical History:  Diagnosis Date   Allergic rhinitis    Arthritis    Asthma    Chest pain 12/27/2017   Chronic diastolic CHF (congestive heart failure) (HCC)    COPD (chronic obstructive pulmonary disease) (HCC)    Depression    DM (diabetes mellitus) ([place])    DVT (deep vein thrombosis) in pregnancy    Dysrhythmia    atrial fibrilation   GERD (gastroesophageal reflux disease)    Headache(784.0)    HTN (hypertension)    Hyperlipidemia    Obesity    Pneumonia 04/2018   RIGHT LOBE   Shortness of breath    Sleep apnea    compliant with CPAP   Current Outpatient Medications  Medication Sig Dispense Refill   Accu-Chek Softclix Lancets lancets Use as instructed to check blood sugar three times daily. E11.69 100 each 12   albuterol (VENTOLIN HFA) 108 (90 Base) MCG/ACT inhaler Inhale 2 puffs into the lungs every 4 (four) hours as needed for wheezing or shortness of breath.      Alcohol Swabs (B-D SINGLE USE SWABS REGULAR) PADS 1 each by Other route  daily.     ALPRAZolam (XANAX) 0.5 MG tablet Take 1 tablet (0.5 mg total) by mouth 2 (two) times daily as needed for anxiety. 30 tablet 0   amiodarone (PACERONE) 200 MG tablet Take 2 tablets (400 mg total) by mouth 2 (two) times daily for 7 days, THEN 1 tablet (200 mg total) daily for 23 days. 51 tablet 0   apixaban (ELIQUIS) 5 MG TABS tablet Take 1 tablet (5 mg total) by mouth 2 (two) times daily. 180 tablet 1   B-D ULTRAFINE III SHORT PEN 31G X 8 MM MISC 1 each by Other route in the morning, at noon, in the evening, and at bedtime.     blood glucose meter kit and supplies KIT Dispense based on patient and insurance preference. Use up to four times daily as directed. 1 each 0   Blood Glucose Monitoring Suppl (ACCU-CHEK GUIDE ME) w/Device KIT Use as instructed to check blood sugar three times daily. E11.69 1 kit 0   Blood Glucose Monitoring Suppl (ONETOUCH VERIO) w/Device  KIT Use to check blood sugar TID. E11.69 1 kit 0   colchicine 0.6 MG tablet Take 0.5 tablets (0.3 mg total) by mouth daily. 90 tablet 0   DULoxetine (CYMBALTA) 60 MG capsule Take 1 capsule (60 mg total) by mouth daily. 90 capsule 3   EPINEPHrine 0.3 mg/0.3 mL IJ SOAJ injection Inject 0.3 mg into the muscle as needed for anaphylaxis. 1 each 1   glucose blood (ONETOUCH VERIO) test strip Use to check blood sugar TID. E11.69 100 each 6   insulin aspart (NOVOLOG) 100 UNIT/ML injection Inject 12 Units into the skin 3 (three) times daily before meals. For blood sugars 0-199 give 0 units of insulin, 201-250 give 4 units, 251-300 give 6 units, 301-350 give 8 units, 351-400 give 10 units,> 400 give 12 units and call M.D. 10 mL 3   LEVEMIR 100 UNIT/ML injection INJECT 0.4 MLS (40 UNITS TOTAL) INTO THE SKIN 2 (TWO) TIMES DAILY. 60 mL 1   metoprolol succinate (TOPROL-XL) 25 MG 24 hr tablet Take 1 tablet (25 mg total) by mouth daily. 30 tablet 0   Multiple Vitamins-Minerals (MULTIVITAMIN WOMEN PO) Take 1 tablet by mouth daily.     nitroGLYCERIN  (NITROSTAT) 0.4 MG SL tablet Place 1 tablet (0.4 mg total) under the tongue every 5 (five) minutes x 3 doses as needed for chest pain. 25 tablet 12   OneTouch Delica Lancets 46T MISC Use to check blood sugar TID.  E11.69 100 each 6   pantoprazole (PROTONIX) 40 MG tablet Take 40 mg by mouth 2 (two) times daily.     Potassium Chloride ER 20 MEQ TBCR Take 20 mEq by mouth daily. 30 tablet 6   RESTASIS 0.05 % ophthalmic emulsion Place 1 drop into both eyes 2 (two) times daily.  12   rosuvastatin (CRESTOR) 10 MG tablet Take 1 tablet (10 mg total) by mouth daily. 90 tablet 1   torsemide (DEMADEX) 20 MG tablet Take 20 mg by mouth 2 (two) times daily.     TRULICITY 1.5 KP/5.4SF SOPN INJECT 1.5 MG INTO THE SKIN ONCE A WEEK. 0.5 mL 1   No current facility-administered medications for this encounter.   Allergies  Allergen Reactions   Sulfa Antibiotics Itching   Social History   Socioeconomic History   Marital status: Significant Other    Spouse name: Not on file   Number of children: 2   Years of education: Not on file   Highest education level: Some college, no degree  Occupational History   Not on file  Tobacco Use   Smoking status: Never   Smokeless tobacco: Never  Vaping Use   Vaping Use: Never used  Substance and Sexual Activity   Alcohol use: No   Drug use: No   Sexual activity: Not Currently  Other Topics Concern   Not on file  Social History Narrative   Pt lives in [place] with spouse.  2 grown children.   Previously worked in [employer] at [employer].  Now on disability   Social Determinants of Health   Financial Resource Strain: Medium Risk   Difficulty of Paying Living Expenses: Somewhat hard  Food Insecurity: No Food Insecurity   Worried About Charity fundraiser in the Last Year: Never true   Ran Out of Food in the Last Year: Never true  Transportation Needs: Unmet Transportation Needs   Lack of Transportation (Medical): Yes   Lack of Transportation (Non-Medical): No   Physical Activity: Not on file  Stress: Not  on file  Social Connections: Not on file  Intimate Partner Violence: Not on file   Family History  Problem Relation Age of Onset   Cancer Mother    Hypertension Mother    Cancer Father    CAD Other    Hypertension Sister    Hypertension Brother    BP 102/60   Pulse (!) 103   Wt 124.7 kg   SpO2 100%   BMI 43.07 kg/m   Wt Readings from Last 3 Encounters:  02/23/21 124.7 kg  02/22/21 122.7 kg  02/15/21 120.2 kg   PHYSICAL EXAM: General:  NAD. No resp difficulty, chronically ill appearing HEENT: Normal Neck: Supple. JVP to jaw. Carotids 2+ bilat; no bruits. No lymphadenopathy or thryomegaly appreciated. Cor: PMI nondisplaced. Irregular rate & rhythm. No rubs, gallops or murmurs. Lungs: Fine crackles in bases Abdomen: Obese, soft, +tender, nondistended. No hepatosplenomegaly. No bruits or masses. Good bowel sounds. Extremities: No cyanosis, clubbing, rash, 2+ LE edema (appears to have lymphedema as well) Neuro: Alert & oriented x 3, cranial nerves grossly intact. Moves all 4 extremities w/o difficulty. Affect pleasant.  ECG: atrial flutter w/ PACs 101 bpm (personally reviewed).  ReDs: 53%  ASSESSMENT & PLAN:  1. Valvular heart disease s/p MVR/TVR  on 5/12 - Severe TR s/p annuloplasty. Severe MR s/p annuloplasty on 12/30/20 - Echo 01/02/21 LVEF 55% RV moderately HK. Moderate TR. No significant MR. No effusion. - Sternotomy site well healed.    2. Acute Systolic HF with R>>L HF - TEE 4/22 EF 45-50% RV moderately HK  - Echo LVEF 55% RV moderately HK. Moderate TR. No significant MR. No effusion. - NYHA Class IIIb. Volume overloaded, ReDs Clip 53%. Weight up 20 lbs from discharge 2 weeks ago. - Give 80 mg IV lasix today and tomorrow with remote health; serial BMETs each day. - Hold PO torsemide for now. - No Spiro w/ CKD. - No SGLT2i for now with recent GFR 19.  - BMET & BNP today.  3. Longstanding Persistent Atrial  Fibrillation s/p Maze 5/15 - Persistent AF, HR 103 on EKG today.  - Increase Amio 200 mg bid. TSH/LFTs ok (6/22). - Off digoxin with recent AKI and EF 55%.  - On Eliquis 5 mg bid. No bleeding issues. - Refer to EP for AV nodal ablation/PPM.   4. Stage IIIb CKD  - Recent AKIs on last 2 admissions. Followed by nephrology. - Baseline SCr 1.6-1.8. - BMET today.   5. DM2 - Management per PCP.  - Recent GFR too low for SGLT2i.  - A1c (6/22) 7.4  6. Obesity/ Deconditioning Body mass index is 43.07 kg/m. - She has been referred to CR.  F/u on Friday. If still markedly volume overloaded with worsening kidney function will plan to admit.  [place], FNP 02/23/21  Addendum: unable to receive IV lasix at home. Will have her take torsemide 80 mg bid x 2 days + 40 mEq of KCl bid x 2 days. Will follow up on Friday in clinic with repeat labs.

## 2021-02-22 ENCOUNTER — Ambulatory Visit: Payer: Self-pay | Admitting: Thoracic Surgery (Cardiothoracic Vascular Surgery)

## 2021-02-22 ENCOUNTER — Ambulatory Visit
Admission: RE | Admit: 2021-02-22 | Discharge: 2021-02-22 | Disposition: A | Discharge status: Home / Self Care | Payer: Medicare Other | Source: Ambulatory Visit | Attending: Thoracic Surgery (Cardiothoracic Vascular Surgery) | Admitting: Thoracic Surgery (Cardiothoracic Vascular Surgery)

## 2021-02-22 ENCOUNTER — Other Ambulatory Visit: Payer: Self-pay

## 2021-02-22 ENCOUNTER — Ambulatory Visit (INDEPENDENT_AMBULATORY_CARE_PROVIDER_SITE_OTHER): Payer: Self-pay | Admitting: Physician Assistant

## 2021-02-22 VITALS — BP 124/89 | HR 115 | Resp 20 | Ht 67.0 in | Wt 270.4 lb

## 2021-02-22 DIAGNOSIS — Z9889 Other specified postprocedural states: Secondary | ICD-10-CM | POA: Insufficient documentation

## 2021-02-22 DIAGNOSIS — Z8679 Personal history of other diseases of the circulatory system: Secondary | ICD-10-CM

## 2021-02-22 NOTE — Patient Instructions (Signed)
Continue to observe sternal precautions. No change in medications. Follow-up in 1 month with Dr. Roxan Hockey with chest x-ray.

## 2021-02-22 NOTE — Progress Notes (Signed)
HPI:  Patient returns for routine postoperative follow-up having undergone mitral valve and tricuspid valve annuloplasty's along with left and right sided maze clipping of the left atrial appendage by Dr. Roxan Gallagher in May of this year.  Postoperative course was complicated by volume excess in the presence of acute on chronic renal insufficiency.  She was followed closely by the advanced heart failure team while in the hospital and was maintained on inotropic support for several days.  She remained in rate controlled atrial fibrillation and was discharged on Eliquis.  She has been readmitted to the hospital twice since then.  First on 01/29/2021 for acute exacerbation of heart failure with acute on chronic kidney disease sees.  DC cardioversion was carried out during that admission and she was discharged on 02/02/2021 in sinus rhythm.  She remained on torsemide as recommended by the heart failure team.  Then on 02/07/2021, she was admitted with a primary complaint of chest pain.  At that time she was again found to be in atrial fibrillation with RVR.  She was loaded with additional amiodarone and DC cardioversion was carried out once again.  She was discharged on her usual medications along with prednisone and colchicine as recommended by the heart failure team.  She presents today for routine scheduled follow-up.  She feels she is gradually improving overall.  She has less short of breath but still uses some home oxygen on occasion.  She has some soreness adjacent to the sternotomy incision on the right side.  This is most pronounced at night and affects her ability to sleep. Her torsemide has been increased to 80 mg/day.   Current Outpatient Medications  Medication Sig Dispense Refill   Accu-Chek Softclix Lancets lancets Use as instructed to check blood sugar three times daily. E11.69 100 each 12   albuterol (VENTOLIN HFA) 108 (90 Base) MCG/ACT inhaler Inhale 2 puffs into the lungs every 4 (four) hours  as needed for wheezing or shortness of breath.      Alcohol Swabs (B-D SINGLE USE SWABS REGULAR) PADS 1 each by Other route daily.     ALPRAZolam (XANAX) 0.5 MG tablet Take 1 tablet (0.5 mg total) by mouth 2 (two) times daily as needed for anxiety. 30 tablet 0   amiodarone (PACERONE) 200 MG tablet Take 2 tablets (400 mg total) by mouth 2 (two) times daily for 7 days, THEN 1 tablet (200 mg total) daily for 23 days. 51 tablet 0   apixaban (ELIQUIS) 5 MG TABS tablet Take 1 tablet (5 mg total) by mouth 2 (two) times daily. 180 tablet 1   B-D ULTRAFINE III SHORT PEN 31G X 8 MM MISC 1 each by Other route in the morning, at noon, in the evening, and at bedtime.     blood glucose meter kit and supplies KIT Dispense based on patient and insurance preference. Use up to four times daily as directed. 1 each 0   Blood Glucose Monitoring Suppl (ACCU-CHEK GUIDE ME) w/Device KIT Use as instructed to check blood sugar three times daily. E11.69 1 kit 0   Blood Glucose Monitoring Suppl (ONETOUCH VERIO) w/Device KIT Use to check blood sugar TID. E11.69 1 kit 0   colchicine 0.6 MG tablet Take 0.5 tablets (0.3 mg total) by mouth daily. 90 tablet 0   DULoxetine (CYMBALTA) 60 MG capsule Take 1 capsule (60 mg total) by mouth daily. 90 capsule 3   EPINEPHrine 0.3 mg/0.3 mL IJ SOAJ injection Inject 0.3 mg into the muscle as needed for  anaphylaxis. 1 each 1   glucose blood (ONETOUCH VERIO) test strip Use to check blood sugar TID. E11.69 100 each 6   insulin aspart (NOVOLOG) 100 UNIT/ML injection Inject 12 Units into the skin 3 (three) times daily before meals. For blood sugars 0-199 give 0 units of insulin, 201-250 give 4 units, 251-300 give 6 units, 301-350 give 8 units, 351-400 give 10 units,> 400 give 12 units and call M.D. 10 mL 3   LEVEMIR 100 UNIT/ML injection INJECT 0.4 MLS (40 UNITS TOTAL) INTO THE SKIN 2 (TWO) TIMES DAILY. 60 mL 1   metoprolol succinate (TOPROL-XL) 25 MG 24 hr tablet Take 1 tablet (25 mg total) by mouth  daily. 30 tablet 0   Multiple Vitamins-Minerals (MULTIVITAMIN WOMEN PO) Take 1 tablet by mouth daily.     nitroGLYCERIN (NITROSTAT) 0.4 MG SL tablet Place 1 tablet (0.4 mg total) under the tongue every 5 (five) minutes x 3 doses as needed for chest pain. 25 tablet 12   OneTouch Delica Lancets 46T MISC Use to check blood sugar TID.  E11.69 100 each 6   pantoprazole (PROTONIX) 40 MG tablet Take 40 mg by mouth 2 (two) times daily.     Potassium Chloride ER 20 MEQ TBCR Take 20 mEq by mouth daily. 30 tablet 6   RESTASIS 0.05 % ophthalmic emulsion Place 1 drop into both eyes 2 (two) times daily.  12   rosuvastatin (CRESTOR) 10 MG tablet Take 1 tablet (10 mg total) by mouth daily. 90 tablet 1   torsemide (DEMADEX) 20 MG tablet Take 2 tablets (40 mg total) by mouth daily. (Patient taking differently: Take 40 mg by mouth 3 (three) times daily.) 035 tablet 3   TRULICITY 1.5 WS/5.6CL SOPN INJECT 1.5 MG INTO THE SKIN ONCE A WEEK. 0.5 mL 1   No current facility-administered medications for this visit.    Physical Exam Blood pressure 124/89 Heart rate 115 Respirations 20 SPO2 95%  Heart-irregularly irregular rhythm.  Rate is around 100/min.  There is no murmur. Chest- breath sounds are clear to auscultation.  Sternotomy incision is well-healed.  There is some tenderness along the right side of the top half of the sternotomy incision.  Sternum is stable.  Chest x-ray was reviewed and continues to show stable cardiomegaly.  There is no consolidation or effusions. Extremities-she continues to have significant bilateral lower extremity edema.     Diagnostic Tests:  EXAM: CHEST - 2 VIEW   COMPARISON:  Chest x-ray from February 07, 2021.   FINDINGS: Trachea is midline. Cardiomediastinal contours and hilar structures with stable appearance. Median sternotomy changes for atrial appendage clipping and mitral valve repair and tricuspid valve replacement as before.   Heart size remains enlarged.   No  consolidation.  No sign of pleural effusion.   On limited assessment no acute skeletal process.   IMPRESSION: No active cardiopulmonary disease.   Cardiomegaly and signs of Maze procedure with valvular replacement/repair and median sternotomy as before.     Electronically Signed   By: Zetta Bills M.D.   On: 02/22/2021 14:40    Impression / Plan: -[name] is about 7 weeks post mitral and tricuspid annuloplasty with right and left-sided maze procedure and clipping of the left atrial appendage.  Her postoperative course has been tenuous with 2 additional admissions over the last month for acute exacerbation of chronic heart failure and acute on chronic kidney disease.  She was cardioverted for persistent atrial fibrillation during both of those admissions but  appears to be in atrial fibrillation again today.  Her ventricular rate is reasonably controlled.  Despite these setbacks, she feels she is now progressing and feels the best she has since her surgery.  She continues to use home oxygen on occasion.  She remains on high-dose torsemide, amiodarone, and Eliquis in addition to the medications listed above.  She is being followed closely by the heart failure team.  No changes in medications are made from our standpoint.  She is encouraged to continue to observe sternal precautions for the next 4 weeks.  I will prescribe Toradol for use at night to help her rest since she continues to have some parasternal pain. She is asked to follow-up in 1 month with Dr. Roxan Gallagher.   Antony Odea, PA-C [facility] [phone]

## 2021-02-23 ENCOUNTER — Telehealth (HOSPITAL_COMMUNITY): Payer: Self-pay | Admitting: Family Medicine

## 2021-02-23 ENCOUNTER — Other Ambulatory Visit: Payer: Self-pay | Admitting: Physician Assistant

## 2021-02-23 ENCOUNTER — Encounter (HOSPITAL_COMMUNITY): Payer: Self-pay

## 2021-02-23 ENCOUNTER — Ambulatory Visit (HOSPITAL_BASED_OUTPATIENT_CLINIC_OR_DEPARTMENT_OTHER)
Admission: RE | Admit: 2021-02-23 | Discharge: 2021-02-23 | Disposition: A | Discharge status: Home / Self Care | Payer: Medicare Other | Source: Ambulatory Visit | Attending: Family Medicine | Admitting: Family Medicine

## 2021-02-23 ENCOUNTER — Telehealth (HOSPITAL_BASED_OUTPATIENT_CLINIC_OR_DEPARTMENT_OTHER): Payer: Self-pay | Admitting: Cardiology

## 2021-02-23 ENCOUNTER — Other Ambulatory Visit (HOSPITAL_COMMUNITY): Payer: Self-pay

## 2021-02-23 VITALS — BP 102/60 | HR 103 | Wt 275.0 lb

## 2021-02-23 DIAGNOSIS — I2510 Atherosclerotic heart disease of native coronary artery without angina pectoris: Secondary | ICD-10-CM | POA: Insufficient documentation

## 2021-02-23 DIAGNOSIS — N179 Acute kidney failure, unspecified: Secondary | ICD-10-CM | POA: Insufficient documentation

## 2021-02-23 DIAGNOSIS — I340 Nonrheumatic mitral (valve) insufficiency: Secondary | ICD-10-CM

## 2021-02-23 DIAGNOSIS — N1832 Chronic kidney disease, stage 3b: Secondary | ICD-10-CM

## 2021-02-23 DIAGNOSIS — Z79899 Other long term (current) drug therapy: Secondary | ICD-10-CM | POA: Insufficient documentation

## 2021-02-23 DIAGNOSIS — I5023 Acute on chronic systolic (congestive) heart failure: Secondary | ICD-10-CM

## 2021-02-23 DIAGNOSIS — I4811 Longstanding persistent atrial fibrillation: Secondary | ICD-10-CM | POA: Diagnosis not present

## 2021-02-23 DIAGNOSIS — Z596 Low income: Secondary | ICD-10-CM | POA: Insufficient documentation

## 2021-02-23 DIAGNOSIS — I480 Paroxysmal atrial fibrillation: Secondary | ICD-10-CM

## 2021-02-23 DIAGNOSIS — I5042 Chronic combined systolic (congestive) and diastolic (congestive) heart failure: Secondary | ICD-10-CM | POA: Insufficient documentation

## 2021-02-23 DIAGNOSIS — Z7901 Long term (current) use of anticoagulants: Secondary | ICD-10-CM | POA: Insufficient documentation

## 2021-02-23 DIAGNOSIS — E669 Obesity, unspecified: Secondary | ICD-10-CM

## 2021-02-23 DIAGNOSIS — Z6841 Body Mass Index (BMI) 40.0 and over, adult: Secondary | ICD-10-CM | POA: Insufficient documentation

## 2021-02-23 DIAGNOSIS — I130 Hypertensive heart and chronic kidney disease with heart failure and stage 1 through stage 4 chronic kidney disease, or unspecified chronic kidney disease: Secondary | ICD-10-CM | POA: Insufficient documentation

## 2021-02-23 DIAGNOSIS — Z952 Presence of prosthetic heart valve: Secondary | ICD-10-CM | POA: Insufficient documentation

## 2021-02-23 DIAGNOSIS — I071 Rheumatic tricuspid insufficiency: Secondary | ICD-10-CM

## 2021-02-23 DIAGNOSIS — Z20822 Contact with and (suspected) exposure to covid-19: Secondary | ICD-10-CM | POA: Diagnosis not present

## 2021-02-23 DIAGNOSIS — I38 Endocarditis, valve unspecified: Secondary | ICD-10-CM | POA: Insufficient documentation

## 2021-02-23 DIAGNOSIS — E1169 Type 2 diabetes mellitus with other specified complication: Secondary | ICD-10-CM

## 2021-02-23 DIAGNOSIS — Z794 Long term (current) use of insulin: Secondary | ICD-10-CM | POA: Insufficient documentation

## 2021-02-23 DIAGNOSIS — E1122 Type 2 diabetes mellitus with diabetic chronic kidney disease: Secondary | ICD-10-CM | POA: Insufficient documentation

## 2021-02-23 LAB — BASIC METABOLIC PANEL
Anion gap: 6 (ref 5–15)
BUN: 29 mg/dL — ABNORMAL HIGH (ref 8–23)
CO2: 34 mmol/L — ABNORMAL HIGH (ref 22–32)
Calcium: 9.3 mg/dL (ref 8.9–10.3)
Chloride: 98 mmol/L (ref 98–111)
Creatinine, Ser: 2.3 mg/dL — ABNORMAL HIGH (ref 0.44–1.00)
GFR, Estimated: 23 mL/min — ABNORMAL LOW (ref >60)
Glucose, Bld: 149 mg/dL — ABNORMAL HIGH (ref 70–99)
Potassium: 3.6 mmol/L (ref 3.5–5.1)
Sodium: 138 mmol/L (ref 135–145)

## 2021-02-23 LAB — BRAIN NATRIURETIC PEPTIDE: B Natriuretic Peptide: 204.3 pg/mL — ABNORMAL HIGH (ref 0.0–100.0)

## 2021-02-23 MED ORDER — POTASSIUM CHLORIDE ER 20 MEQ PO TBCR
20.0000 meq | EXTENDED_RELEASE_TABLET | Freq: Two times a day (BID) | ORAL | 6 refills | Status: DC
Start: 2021-02-23 — End: 2021-02-28

## 2021-02-23 MED ORDER — AMIODARONE HCL 200 MG PO TABS: 200.0000 mg | ORAL_TABLET | Freq: Two times a day (BID) | ORAL | 3 refills | Status: DC

## 2021-02-23 MED ORDER — TRAMADOL HCL 50 MG PO TABS: 50.0000 mg | ORAL_TABLET | Freq: Three times a day (TID) | ORAL | 0 refills | Status: DC | PRN

## 2021-02-23 NOTE — Telephone Encounter (Signed)
New Message:    Pt would like for Dr Harrell Gave to please calk her when she have time. She saw another doctor this morning and wants to discuss this visit with Dr Harrell Gave only please.

## 2021-02-23 NOTE — Patient Instructions (Addendum)
HOLD Torsemide until you return for follow up Friday 02/25/21 INCREASE Amiodarone to 200 mg, one tab twice a day   Labs today We will only contact you if something comes back abnormal or we need to make some changes. Otherwise no news is good news!  You have been referred to [facility] for home management of CHF -you will receive a dose of IV lasix 7/6 and 7/7  Your physician recommends that you schedule a follow-up appointment in: [facility] -they will be in contact with an appointment   Your physician recommends that you schedule a follow-up appointment in: 3 days  Do the following things EVERYDAY: Weigh yourself in the morning before breakfast. Write it down and keep it in a log. Take your medicines as prescribed Eat low salt foods--Limit salt (sodium) to 2000 mg per day.  Stay as active as you can everyday Limit all fluids for the day to less than 2 liters  At the [facility], you and your health needs are our priority. As part of our continuing mission to provide you with exceptional heart care, we have created designated Provider Care Teams. These Care Teams include your primary Cardiologist (physician) and Advanced Practice Providers (APPs- Physician Assistants and Nurse Practitioners) who all work together to provide you with the care you need, when you need it.   You may see any of the following providers on your designated Care Team at your next follow up: Dr Glori Bickers Dr Loralie Champagne Dr Patrice Paradise, NP Lyda Jester, [place] Ginnie Smart Audry Riles, PharmD   Please be sure to bring in all your medications bottles to every appointment.   If you have any questions or concerns before your next appointment please send [place] a message through [place] or call our office at [phone].    TO LEAVE A MESSAGE FOR THE NURSE SELECT OPTION 2, PLEASE LEAVE A MESSAGE INCLUDING: YOUR NAME DATE OF BIRTH CALL BACK  NUMBER REASON FOR CALL**this is important as we prioritize the call backs  YOU WILL RECEIVE A CALL BACK THE SAME DAY AS LONG AS YOU CALL BEFORE 4:00 PM

## 2021-02-23 NOTE — Telephone Encounter (Signed)
Spoke with patient regarding her lab results and change in medication plan. She will take torsemide 80 mg bid x 2 days + 40 mEq of KCl bid x 2 days. She will follow back in clinic on Friday with repeat labs. Patient voiced understanding and in agreement with plan.  Allena Katz, FNP-BC

## 2021-02-23 NOTE — Progress Notes (Signed)
ReDS Vest / Clip - 02/23/21 0900       ReDS Vest / Clip   Station Marker C    Ruler Value 27.5    ReDS Value Range High volume overload    ReDS Actual Value 53

## 2021-02-23 NOTE — Telephone Encounter (Signed)
Called patient, she states that she went to see her Cardiothoracic doctor yesterday (note in epic) as well as an appointment today with [place]. They states patient had fluid on board, increased Torsemide and will recheck labs, they stated if Friday she is no better they will send her to hospital. She wanted to make Dr.Christopher aware, advised with patient I would call back with any other recommendations from MD.  Patient verbalized understanding.

## 2021-02-25 ENCOUNTER — Other Ambulatory Visit: Payer: Self-pay

## 2021-02-25 ENCOUNTER — Ambulatory Visit (HOSPITAL_BASED_OUTPATIENT_CLINIC_OR_DEPARTMENT_OTHER)
Admission: RE | Admit: 2021-02-25 | Discharge: 2021-02-25 | Disposition: A | Discharge status: Home / Self Care | Payer: Medicare Other | Source: Ambulatory Visit | Attending: Family Medicine | Admitting: Family Medicine

## 2021-02-25 ENCOUNTER — Encounter (HOSPITAL_COMMUNITY): Payer: Self-pay | Admitting: Anesthesiology

## 2021-02-25 ENCOUNTER — Inpatient Hospital Stay (HOSPITAL_COMMUNITY)
Admission: EM | Admit: 2021-02-25 | Discharge: 2021-02-28 | DRG: 291 | Disposition: A | Discharge status: Home Health | Payer: Medicare Other | Attending: Internal Medicine | Admitting: Internal Medicine

## 2021-02-25 ENCOUNTER — Encounter (HOSPITAL_COMMUNITY): Payer: Self-pay

## 2021-02-25 ENCOUNTER — Emergency Department (HOSPITAL_COMMUNITY): Payer: Medicare Other

## 2021-02-25 ENCOUNTER — Inpatient Hospital Stay (HOSPITAL_COMMUNITY): Payer: Medicare Other

## 2021-02-25 ENCOUNTER — Encounter (HOSPITAL_COMMUNITY): Payer: Self-pay | Admitting: Cardiology

## 2021-02-25 VITALS — BP 118/70 | HR 136 | Wt 273.2 lb

## 2021-02-25 DIAGNOSIS — Z7901 Long term (current) use of anticoagulants: Secondary | ICD-10-CM | POA: Insufficient documentation

## 2021-02-25 DIAGNOSIS — I4891 Unspecified atrial fibrillation: Secondary | ICD-10-CM | POA: Diagnosis not present

## 2021-02-25 DIAGNOSIS — Z6841 Body Mass Index (BMI) 40.0 and over, adult: Secondary | ICD-10-CM

## 2021-02-25 DIAGNOSIS — Z952 Presence of prosthetic heart valve: Secondary | ICD-10-CM | POA: Insufficient documentation

## 2021-02-25 DIAGNOSIS — I059 Rheumatic mitral valve disease, unspecified: Secondary | ICD-10-CM | POA: Diagnosis present

## 2021-02-25 DIAGNOSIS — E669 Obesity, unspecified: Secondary | ICD-10-CM

## 2021-02-25 DIAGNOSIS — I484 Atypical atrial flutter: Secondary | ICD-10-CM | POA: Diagnosis present

## 2021-02-25 DIAGNOSIS — M7989 Other specified soft tissue disorders: Secondary | ICD-10-CM | POA: Insufficient documentation

## 2021-02-25 DIAGNOSIS — I4811 Longstanding persistent atrial fibrillation: Secondary | ICD-10-CM | POA: Diagnosis present

## 2021-02-25 DIAGNOSIS — Z79899 Other long term (current) drug therapy: Secondary | ICD-10-CM | POA: Insufficient documentation

## 2021-02-25 DIAGNOSIS — I2510 Atherosclerotic heart disease of native coronary artery without angina pectoris: Secondary | ICD-10-CM | POA: Insufficient documentation

## 2021-02-25 DIAGNOSIS — N1832 Chronic kidney disease, stage 3b: Secondary | ICD-10-CM | POA: Diagnosis present

## 2021-02-25 DIAGNOSIS — R609 Edema, unspecified: Secondary | ICD-10-CM

## 2021-02-25 DIAGNOSIS — R0600 Dyspnea, unspecified: Secondary | ICD-10-CM

## 2021-02-25 DIAGNOSIS — R0609 Other forms of dyspnea: Secondary | ICD-10-CM

## 2021-02-25 DIAGNOSIS — I5023 Acute on chronic systolic (congestive) heart failure: Secondary | ICD-10-CM

## 2021-02-25 DIAGNOSIS — Z794 Long term (current) use of insulin: Secondary | ICD-10-CM

## 2021-02-25 DIAGNOSIS — I480 Paroxysmal atrial fibrillation: Secondary | ICD-10-CM

## 2021-02-25 DIAGNOSIS — J449 Chronic obstructive pulmonary disease, unspecified: Secondary | ICD-10-CM | POA: Diagnosis present

## 2021-02-25 DIAGNOSIS — Z882 Allergy status to sulfonamides status: Secondary | ICD-10-CM | POA: Diagnosis not present

## 2021-02-25 DIAGNOSIS — K219 Gastro-esophageal reflux disease without esophagitis: Secondary | ICD-10-CM | POA: Diagnosis present

## 2021-02-25 DIAGNOSIS — E1169 Type 2 diabetes mellitus with other specified complication: Secondary | ICD-10-CM

## 2021-02-25 DIAGNOSIS — E1122 Type 2 diabetes mellitus with diabetic chronic kidney disease: Secondary | ICD-10-CM | POA: Diagnosis present

## 2021-02-25 DIAGNOSIS — I252 Old myocardial infarction: Secondary | ICD-10-CM

## 2021-02-25 DIAGNOSIS — I970 Postcardiotomy syndrome: Secondary | ICD-10-CM | POA: Diagnosis present

## 2021-02-25 DIAGNOSIS — E785 Hyperlipidemia, unspecified: Secondary | ICD-10-CM | POA: Diagnosis present

## 2021-02-25 DIAGNOSIS — Z20822 Contact with and (suspected) exposure to covid-19: Secondary | ICD-10-CM | POA: Diagnosis present

## 2021-02-25 DIAGNOSIS — I130 Hypertensive heart and chronic kidney disease with heart failure and stage 1 through stage 4 chronic kidney disease, or unspecified chronic kidney disease: Principal | ICD-10-CM | POA: Diagnosis present

## 2021-02-25 DIAGNOSIS — R0602 Shortness of breath: Secondary | ICD-10-CM | POA: Insufficient documentation

## 2021-02-25 DIAGNOSIS — Z8249 Family history of ischemic heart disease and other diseases of the circulatory system: Secondary | ICD-10-CM | POA: Insufficient documentation

## 2021-02-25 DIAGNOSIS — F32A Depression, unspecified: Secondary | ICD-10-CM | POA: Diagnosis present

## 2021-02-25 DIAGNOSIS — I4892 Unspecified atrial flutter: Secondary | ICD-10-CM | POA: Insufficient documentation

## 2021-02-25 DIAGNOSIS — I5042 Chronic combined systolic (congestive) and diastolic (congestive) heart failure: Secondary | ICD-10-CM | POA: Insufficient documentation

## 2021-02-25 DIAGNOSIS — E876 Hypokalemia: Secondary | ICD-10-CM | POA: Diagnosis present

## 2021-02-25 DIAGNOSIS — R42 Dizziness and giddiness: Secondary | ICD-10-CM | POA: Insufficient documentation

## 2021-02-25 LAB — CBC WITH DIFFERENTIAL/PLATELET
Abs Immature Granulocytes: 0.01 10*3/uL (ref 0.00–0.07)
Basophils Absolute: 0 10*3/uL (ref 0.0–0.1)
Basophils Relative: 0 %
Eosinophils Absolute: 0.1 10*3/uL (ref 0.0–0.5)
Eosinophils Relative: 2 %
HCT: 33.3 % — ABNORMAL LOW (ref 36.0–46.0)
Hemoglobin: 10.1 g/dL — ABNORMAL LOW (ref 12.0–15.0)
Immature Granulocytes: 0 %
Lymphocytes Relative: 27 %
Lymphs Abs: 1.8 10*3/uL (ref 0.7–4.0)
MCH: 27.5 pg (ref 26.0–34.0)
MCHC: 30.3 g/dL (ref 30.0–36.0)
MCV: 90.7 fL (ref 80.0–100.0)
Monocytes Absolute: 1 10*3/uL (ref 0.1–1.0)
Monocytes Relative: 15 %
Neutro Abs: 3.7 10*3/uL (ref 1.7–7.7)
Neutrophils Relative %: 56 %
Platelets: 159 10*3/uL (ref 150–400)
RBC: 3.67 MIL/uL — ABNORMAL LOW (ref 3.87–5.11)
RDW: 16.1 % — ABNORMAL HIGH (ref 11.5–15.5)
WBC: 6.7 10*3/uL (ref 4.0–10.5)
nRBC: 0 % (ref 0.0–0.2)

## 2021-02-25 LAB — ECHOCARDIOGRAM COMPLETE
Calc EF: 33.4 %
Height: 67 in
MV M vel: 4.18 m/s
MV Peak grad: 69.9 mmHg
MV VTI: 0.9 cm2
Single Plane A2C EF: 28.9 %
Single Plane A4C EF: 35.7 %
Weight: 4368 oz

## 2021-02-25 LAB — RESP PANEL BY RT-PCR (FLU A&B, COVID) ARPGX2
Influenza A by PCR: NEGATIVE
Influenza B by PCR: NEGATIVE
SARS Coronavirus 2 by RT PCR: NEGATIVE

## 2021-02-25 LAB — GLUCOSE, CAPILLARY
Glucose-Capillary: 208 mg/dL — ABNORMAL HIGH (ref 70–99)
Glucose-Capillary: 268 mg/dL — ABNORMAL HIGH (ref 70–99)

## 2021-02-25 LAB — BASIC METABOLIC PANEL
Anion gap: 10 (ref 5–15)
BUN: 26 mg/dL — ABNORMAL HIGH (ref 8–23)
CO2: 30 mmol/L (ref 22–32)
Calcium: 8.9 mg/dL (ref 8.9–10.3)
Chloride: 99 mmol/L (ref 98–111)
Creatinine, Ser: 2.21 mg/dL — ABNORMAL HIGH (ref 0.44–1.00)
GFR, Estimated: 24 mL/min — ABNORMAL LOW (ref >60)
Glucose, Bld: 202 mg/dL — ABNORMAL HIGH (ref 70–99)
Potassium: 3.6 mmol/L (ref 3.5–5.1)
Sodium: 139 mmol/L (ref 135–145)

## 2021-02-25 LAB — PROTIME-INR
INR: 1.8 — ABNORMAL HIGH (ref 0.8–1.2)
Prothrombin Time: 20.7 seconds — ABNORMAL HIGH (ref 11.4–15.2)

## 2021-02-25 LAB — MAGNESIUM: Magnesium: 1.6 mg/dL — ABNORMAL LOW (ref 1.7–2.4)

## 2021-02-25 LAB — BRAIN NATRIURETIC PEPTIDE: B Natriuretic Peptide: 162.7 pg/mL — ABNORMAL HIGH (ref 0.0–100.0)

## 2021-02-25 MED ORDER — DOCUSATE SODIUM 100 MG PO CAPS
100.0000 mg | ORAL_CAPSULE | Freq: Two times a day (BID) | ORAL | Status: DC
  Administered 2021-02-25 – 2021-02-28 (×7): 100 mg via ORAL
  Filled 2021-02-25 (×7): qty 1

## 2021-02-25 MED ORDER — DULOXETINE HCL 60 MG PO CPEP
60.0000 mg | ORAL_CAPSULE | Freq: Every day | ORAL | Status: DC
  Administered 2021-02-26 – 2021-02-28 (×3): 60 mg via ORAL
  Filled 2021-02-25 (×4): qty 1

## 2021-02-25 MED ORDER — ONDANSETRON HCL 4 MG/2ML IJ SOLN
4.0000 mg | Freq: Four times a day (QID) | INTRAMUSCULAR | Status: DC | PRN
  Administered 2021-02-26 (×2): 4 mg via INTRAVENOUS
  Filled 2021-02-25 (×2): qty 2

## 2021-02-25 MED ORDER — ACETAMINOPHEN 325 MG PO TABS
650.0000 mg | ORAL_TABLET | ORAL | Status: DC | PRN
  Administered 2021-02-27: 650 mg via ORAL
  Filled 2021-02-25: qty 2

## 2021-02-25 MED ORDER — ROSUVASTATIN CALCIUM 5 MG PO TABS
10.0000 mg | ORAL_TABLET | Freq: Every day | ORAL | Status: DC
  Administered 2021-02-25 – 2021-02-28 (×4): 10 mg via ORAL
  Filled 2021-02-25 (×4): qty 2

## 2021-02-25 MED ORDER — APIXABAN 5 MG PO TABS
5.0000 mg | ORAL_TABLET | Freq: Two times a day (BID) | ORAL | Status: DC
  Administered 2021-02-25 – 2021-02-28 (×7): 5 mg via ORAL
  Filled 2021-02-25 (×7): qty 1

## 2021-02-25 MED ORDER — AMIODARONE LOAD VIA INFUSION
150.0000 mg | Freq: Once | INTRAVENOUS | Status: AC
  Administered 2021-02-25: 150 mg via INTRAVENOUS
  Filled 2021-02-25: qty 83.34

## 2021-02-25 MED ORDER — PANTOPRAZOLE SODIUM 40 MG PO TBEC
40.0000 mg | DELAYED_RELEASE_TABLET | Freq: Two times a day (BID) | ORAL | Status: DC
  Administered 2021-02-25 – 2021-02-28 (×7): 40 mg via ORAL
  Filled 2021-02-25 (×7): qty 1

## 2021-02-25 MED ORDER — AMIODARONE HCL IN DEXTROSE 360-4.14 MG/200ML-% IV SOLN
60.0000 mg/h | INTRAVENOUS | Status: DC
  Administered 2021-02-26: 30 mg/h via INTRAVENOUS
  Administered 2021-02-26 – 2021-02-28 (×8): 60 mg/h via INTRAVENOUS
  Filled 2021-02-25 (×9): qty 200

## 2021-02-25 MED ORDER — MAGNESIUM SULFATE 2 GM/50ML IV SOLN
2.0000 g | Freq: Once | INTRAVENOUS | Status: AC
  Administered 2021-02-25: 2 g via INTRAVENOUS
  Filled 2021-02-25: qty 50

## 2021-02-25 MED ORDER — INSULIN ASPART 100 UNIT/ML IJ SOLN
0.0000 [IU] | Freq: Three times a day (TID) | INTRAMUSCULAR | Status: DC
  Administered 2021-02-25 – 2021-02-26 (×4): 7 [IU] via SUBCUTANEOUS
  Administered 2021-02-27: 11 [IU] via SUBCUTANEOUS
  Administered 2021-02-27 (×2): 7 [IU] via SUBCUTANEOUS
  Administered 2021-02-28: 4 [IU] via SUBCUTANEOUS

## 2021-02-25 MED ORDER — FUROSEMIDE 10 MG/ML IJ SOLN
80.0000 mg | Freq: Two times a day (BID) | INTRAMUSCULAR | Status: DC
  Administered 2021-02-25 – 2021-02-27 (×4): 80 mg via INTRAVENOUS
  Filled 2021-02-25 (×5): qty 8

## 2021-02-25 MED ORDER — AMIODARONE HCL IN DEXTROSE 360-4.14 MG/200ML-% IV SOLN
60.0000 mg/h | INTRAVENOUS | Status: AC
  Administered 2021-02-25: 60 mg/h via INTRAVENOUS
  Filled 2021-02-25 (×2): qty 200

## 2021-02-25 MED ORDER — SODIUM CHLORIDE 0.9% FLUSH: 3.0000 mL | INTRAVENOUS | Status: DC | PRN

## 2021-02-25 MED ORDER — POTASSIUM CHLORIDE CRYS ER 20 MEQ PO TBCR
40.0000 meq | EXTENDED_RELEASE_TABLET | Freq: Once | ORAL | Status: AC
  Administered 2021-02-25: 40 meq via ORAL
  Filled 2021-02-25: qty 2

## 2021-02-25 MED ORDER — SODIUM CHLORIDE 0.9 % IV SOLN: 250.0000 mL | INTRAVENOUS | Status: DC | PRN

## 2021-02-25 MED ORDER — SODIUM CHLORIDE 0.9% FLUSH
3.0000 mL | Freq: Two times a day (BID) | INTRAVENOUS | Status: DC
  Administered 2021-02-25 – 2021-02-27 (×5): 3 mL via INTRAVENOUS

## 2021-02-25 NOTE — Progress Notes (Signed)
  Echocardiogram 2D Echocardiogram has been performed.  [name] 02/25/2021, 1:34 PM

## 2021-02-25 NOTE — H&P (Addendum)
Advanced Heart Failure Team History and Physical Note   PCP:  Grayce Sessions, NP  PCP-Cardiology: Jodelle Red, MD     Reason for Admission: A/C Diastolic Heart Failure/ A Fib RVR   HPI:    [name] is a 65 year old with h/o obesity, PAF, has had multiple cardioversions, severe MR/TR, DMII, non obstructive CAD, and chronic diastolic /systolic heart failure   Had LHC/RHC  12/2020 with nonobstructive cad and elevated filling pressures + low cardiac index.   On 12/30/20 underwent MV/TV repair with MAZE. Post operative course complicated by AKI and volume overload. AHF team was consulted to assist w/ further management. Post operative Echo showed LVEF 55% RV moderately HK. Moderate TR. No significant MR. No effusion. Hemodynamics improved w/ milrinone. Suspected of having post-op ATN. Nephrology assisted w/ transition back to PO diuretics. She was discharged on 5/28. D/w wt was 281 lb. SCr was 2.8. Nephrology recommended home on torsemide 60 mg daily. She was also in Afib day of d/c. Recommendations were to consider DC-CV in several weeks after atrial irritation settles down from Maze.  She was seen in ED 01/29/21 with SOB and CP, with decreased urinary output. She was admitted for A/CHF, diuresed with IV lasix, RHC showed low filling pressures and moderately reduced CO. She underwent successful DCCV 02/01/21. 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 She was discharged with CR referral and instructions to follow up with nephrology.   Seen in ED 02/07/21 for leg swelling & CP. She was back in Afib, rate controlled. She was loaded w amiodarone gtt, and had epeat DCCV on 02/09/21 with conversion to NSR. 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  She returned to HF clinic on 02/22/21 for post hospital follow after  cardioversion.She was back in A fib and volume overloaded. Torsemide was increased to 80 mg twice a day and amio was increased to 200 mg twice a day. (She did not tell staff she was out of amio).   Today she returned to HF clinic was in A fib RVR with rate in the 130s. SOB with minimal exertion.She was sent to the ED for further evaluation. She has been out of amiodarone for the last 7 days. SOB with exertion. Has not missed dose of eliquis.  EP consulted. Remains in A fib RVR. CXR with vascular congestion. Pertinent labs included: Sars2 negative, Hgb 10, K 3.6, mag 1.6, BNP 162.   SOB with exertion.    Cardiac Studies - Echo (11/13): EF 60-65%, moderate TR - Echo (11/14): EF 60-65%, severe TR, PA peak pressure 47 mmHg - Echo (5/15): EF 50-55% - Exercise stress test (7/6): A. Fib. with controlled vent. response OLD inferior MI and non sp. T wave changes at baseline. - Echo (8/17): EF 50-55%, moderate MR - Exercise stress test (8/17): Atrial Fib. Q wave in lead 3 and AVF with nonsp T wave changes at baseline - Echo (5/19): EF 55-60%, moderate MR - Exercise stress test (5/19): Afib, normal. - LHC (5/19): Prox LAD lesion is 10% stenosed, Dist LM to Ost LAD lesion is 10% stenosed, Ost Cx lesion is 15% stenosed, LV systolic function normal, LVEDP is normal. - Echo (11/19): EF 45-50%, severe MR/TR - Echo (11/21): EF 45-50%, severe MR (rheumatic)/TR - Echo (4/22): EF 45-50%, severe MR mean gradient 2.0 mmHg, severe TR - Echo (5/22):  EF 60-65%, s/p MVR, trace MR mean gradient 7.0 mmHg, moderate TR - RHC (5/22): Elevated R > L heart filling pressures with PAPi low but not markedly so (1.8) suggestive of a significant component of RV dysfunction. Prominent v-waves in PCWP and RA pressure tracings suggestive of significant MR and TR. Cardiac index low. - RHC (6/22): low filling pressures, moderate reduced CO.  RA = 5 RV = 33/2 PA = 35/13 (22) PCW = 13 Fick cardiac output/index = 4.3/2.0 PVR = 2.1  WU FA sat = 97% PA sat = 54%, 54% - DCCV (02/01/21): successful conversion to NSR. - DCCV (02/09/21): successful conversion to NSR.  Review of Systems: [y] = yes, [ ]  = no   General: Weight gain [ ] ; Weight loss [ ] ; Anorexia [ ] ; Fatigue [ ] ; Fever [ ] ; Chills [ ] ; Weakness [Y ]  Cardiac: Chest pain/pressure [ ] ; Resting SOB [ ] ; Exertional SOB [Y ]; Orthopnea [ Y]; Pedal Edema [ ] ; Palpitations [ ] ; Syncope [ ] ; Presyncope [ ] ; Paroxysmal nocturnal dyspnea[ ]   Pulmonary: Cough [ ] ; Wheezing[ ] ; Hemoptysis[ ] ; Sputum [ ] ; Snoring [ ]   GI: Vomiting[ ] ; Dysphagia[ ] ; Melena[ ] ; Hematochezia [ ] ; Heartburn[ ] ; Abdominal pain [ ] ; Constipation [ ] ; Diarrhea [ ] ; BRBPR [ ]   GU: Hematuria[ ] ; Dysuria [ ] ; Nocturia[ ]   Vascular: Pain in legs with walking [ ] ; Pain in feet with lying flat [ ] ; Non-healing sores [ ] ; Stroke [ ] ; TIA [ ] ; Slurred speech [ ] ;  Neuro: Headaches[ ] ; Vertigo[ ] ; Seizures[ ] ; Paresthesias[ ] ;Blurred vision [ ] ; Diplopia [ ] ; Vision changes [ ]   Ortho/Skin: Arthritis [ ] ; Joint pain [ Y]; Muscle pain [ ] ; Joint swelling [ ] ; Back Pain [ Y]; Rash [ ]   Psych: Depression[ ] ; Anxiety[ ]   Heme: Bleeding problems [ ] ; Clotting disorders [ ] ; Anemia [ ]   Endocrine: Diabetes [ Y]; Thyroid dysfunction[ ]    Home Medications Prior to Admission medications   Medication Sig Start Date End Date Taking? Authorizing Provider  albuterol (VENTOLIN HFA) 108 (90 Base) MCG/ACT inhaler Inhale 2 puffs into the lungs every 4 (four) hours as needed for wheezing or shortness of breath.  08/15/19  Yes [provider]  ALPRAZolam Duanne Moron) 0.5 MG tablet Take 1 tablet (0.5 mg total) by mouth 2 (two) times daily as needed for anxiety. Patient taking differently: Take 0.5 mg by mouth daily. 02/15/21  Yes [name], NP  amiodarone (PACERONE) 200 MG tablet Take 1 tablet (200 mg total) by mouth 2 (two) times daily. 02/23/21  Yes Milford, Maricela Bo, FNP  apixaban (ELIQUIS) 5 MG TABS tablet  Take 1 tablet (5 mg total) by mouth 2 (two) times daily. 11/09/20  Yes Buford Dresser, MD  colchicine 0.6 MG tablet Take 0.5 tablets (0.3 mg total) by mouth daily. 02/11/21  Yes Patrecia Pour, MD  docusate sodium (COLACE) 100 MG capsule Take 100 mg by mouth 2 (two) times daily.   Yes [provider]  DULoxetine (CYMBALTA) 60 MG capsule Take 1 capsule (60 mg total) by mouth daily. 09/24/20  Yes [name], NP  EPINEPHrine 0.3 mg/0.3 mL IJ SOAJ injection Inject 0.3 mg into the muscle as needed for anaphylaxis. 06/25/20  Yes [name], NP  insulin aspart (NOVOLOG) 100 UNIT/ML injection Inject 12 Units into the skin 3 (three) times daily before meals. For blood sugars 0-199 give 0 units of insulin, 201-250 give 4 units, 251-300 give  6 units, 301-350 give 8 units, 351-400 give 10 units,> 400 give 12 units and call M.D. Patient taking differently: Inject 5 Units into the skin 3 (three) times daily before meals. For blood sugars 0-199 give 0 units of insulin, 201-250 give 4 units, 251-300 give 6 units, 301-350 give 8 units, 351-400 give 10 units,> 400 give 12 units and call M.D. 06/24/20  Yes [name], NP  LEVEMIR 100 UNIT/ML injection INJECT 0.4 MLS (40 UNITS TOTAL) INTO THE SKIN 2 (TWO) TIMES DAILY. Patient taking differently: Inject 50 Units into the skin 2 (two) times daily. 12/01/20  Yes [name], NP  metoprolol succinate (TOPROL-XL) 25 MG 24 hr tablet Take 1 tablet (25 mg total) by mouth daily. 02/02/21 03/04/21 Yes Alma Friendly, MD  Multiple Vitamins-Minerals (MULTIVITAMIN WOMEN PO) Take 1 tablet by mouth daily.   Yes [provider]  nitroGLYCERIN (NITROSTAT) 0.4 MG SL tablet Place 1 tablet (0.4 mg total) under the tongue every 5 (five) minutes x 3 doses as needed for chest pain. 01/08/14  Yes Charolette Forward, MD  pantoprazole (PROTONIX) 40 MG tablet Take 40 mg by mouth 2 (two) times daily. 12/27/20  Yes [provider]  Potassium  Chloride ER 20 MEQ TBCR Take 20 mEq by mouth 2 (two) times daily. Patient taking differently: Take 20 mEq by mouth daily. 02/23/21  Yes Milford, Maricela Bo, FNP  RESTASIS 0.05 % ophthalmic emulsion Place 1 drop into both eyes 2 (two) times daily. 11/19/17  Yes [provider]  rosuvastatin (CRESTOR) 10 MG tablet Take 1 tablet (10 mg total) by mouth daily. 09/24/20  Yes [name], NP  torsemide (DEMADEX) 20 MG tablet Take 80 mg by mouth daily.   Yes [provider]  TRULICITY 1.5 UE/4.5WU SOPN INJECT 1.5 MG INTO THE SKIN ONCE A WEEK. 12/05/20  Yes [name], NP  Accu-Chek Softclix Lancets lancets Use as instructed to check blood sugar three times daily. E11.69 04/21/20   Charlott Rakes, MD  Alcohol Swabs (B-D SINGLE USE SWABS REGULAR) PADS 1 each by Other route daily. 02/19/19   [provider]  B-D ULTRAFINE III SHORT PEN 31G X 8 MM MISC 1 each by Other route in the morning, at noon, in the evening, and at bedtime. 11/18/20   [provider]  blood glucose meter kit and supplies KIT Dispense based on patient and insurance preference. Use up to four times daily as directed. 02/10/21   Patrecia Pour, MD  Blood Glucose Monitoring Suppl (ACCU-CHEK GUIDE ME) w/Device KIT Use as instructed to check blood sugar three times daily. E11.69 04/21/20   Charlott Rakes, MD  Blood Glucose Monitoring Suppl (ONETOUCH VERIO) w/Device KIT Use to check blood sugar TID. E11.69 02/04/21   Charlott Rakes, MD  glucose blood (ONETOUCH VERIO) test strip Use to check blood sugar TID. E11.69 02/04/21   Charlott Rakes, MD  OneTouch Delica Lancets 98J MISC Use to check blood sugar TID.  E11.69 02/04/21   Charlott Rakes, MD  traMADol (ULTRAM) 50 MG tablet Take 1 tablet (50 mg total) by mouth every 8 (eight) hours as needed. Patient not taking: Reported on 02/25/2021 02/23/21 02/23/22  Malon Kindle    Past Medical History: Past Medical History:  Diagnosis Date   Allergic  rhinitis    Arthritis    Asthma    Chest pain 12/27/2017   Chronic diastolic CHF (congestive heart failure) (HCC)    COPD (chronic obstructive pulmonary disease) ([place])  Depression    DM (diabetes mellitus) ([place])    DVT (deep vein thrombosis) in pregnancy    Dysrhythmia    atrial fibrilation   GERD (gastroesophageal reflux disease)    Headache(784.0)    HTN (hypertension)    Hyperlipidemia    Obesity    Pneumonia 04/2018   RIGHT LOBE   Shortness of breath    Sleep apnea    compliant with CPAP    Past Surgical History: Past Surgical History:  Procedure Laterality Date   ABDOMINAL HYSTERECTOMY     BUBBLE STUDY  11/26/2020   Procedure: BUBBLE STUDY;  Surgeon: Buford Dresser, MD;  Location: [place];  Service: Cardiovascular;;   CARDIOVERSION N/A 05/06/2020   Procedure: CARDIOVERSION;  Surgeon: Buford Dresser, MD;  Location: [place];  Service: Cardiovascular;  Laterality: N/A;   CARDIOVERSION N/A 11/04/2020   Procedure: CARDIOVERSION;  Surgeon: Lelon Perla, MD;  Location: [place];  Service: Cardiovascular;  Laterality: N/A;   CARDIOVERSION N/A 02/01/2021   Procedure: CARDIOVERSION;  Surgeon: Jolaine Artist, MD;  Location[facility] ENDOSCOPY;  Service: Cardiovascular;  Laterality: N/A;   CARDIOVERSION N/A 02/09/2021   Procedure: CARDIOVERSION;  Surgeon: Jolaine Artist, MD;  Location: [facility] ENDOSCOPY;  Service: Cardiovascular;  Laterality: N/A;   CATARACT EXTRACTION, BILATERAL     CHOLECYSTECTOMY     COLONOSCOPY WITH PROPOFOL N/A 03/05/2015   Procedure: COLONOSCOPY WITH PROPOFOL;  Surgeon: Carol Ada, MD;  Location: [facility] ENDOSCOPY;  Service: Endoscopy;  Laterality: N/A;   DIAGNOSTIC LAPAROSCOPY     ingrown hallux Left    KNEE SURGERY     LEFT HEART CATH AND CORONARY ANGIOGRAPHY N/A 12/31/2017   Procedure: LEFT HEART CATH AND CORONARY ANGIOGRAPHY;  Surgeon: Charolette Forward, MD;  Location: [place] CV LAB;  Service: Cardiovascular;  Laterality:  N/A;   MAZE N/A 12/29/2020   Procedure: MAZE;  Surgeon: [name], MD;  Location: [place];  Service: Open Heart Surgery;  Laterality: N/A;   MITRAL VALVE REPAIR N/A 12/29/2020   Procedure: MITRAL VALVE REPAIR USING CARBOMEDICS ANNULOFLEX RING SIZE 30;  Surgeon: [name], MD;  Location: [place];  Service: Open Heart Surgery;  Laterality: N/A;   RIGHT HEART CATH N/A 12/22/2020   Procedure: RIGHT HEART CATH;  Surgeon: Larey Dresser, MD;  Location: [place] CV LAB;  Service: Cardiovascular;  Laterality: N/A;   RIGHT HEART CATH N/A 01/31/2021   Procedure: RIGHT HEART CATH;  Surgeon: Jolaine Artist, MD;  Location: [place] CV LAB;  Service: Cardiovascular;  Laterality: N/A;   TEE WITHOUT CARDIOVERSION N/A 11/26/2020   Procedure: TRANSESOPHAGEAL ECHOCARDIOGRAM (TEE);  Surgeon: Buford Dresser, MD;  Location: [facility] ENDOSCOPY;  Service: Cardiovascular;  Laterality: N/A;   TEE WITHOUT CARDIOVERSION N/A 12/29/2020   Procedure: TRANSESOPHAGEAL ECHOCARDIOGRAM (TEE);  Surgeon: [name], MD;  Location: [place];  Service: Open Heart Surgery;  Laterality: N/A;   TRICUSPID VALVE REPLACEMENT N/A 12/29/2020   Procedure: 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 RING SIZE 34;  Surgeon: [name], MD;  Location: [place];  Service: Open Heart Surgery;  Laterality: N/A;    Family History:  Family History  Problem Relation Age of Onset   Cancer Mother    Hypertension Mother    Cancer Father    CAD Other    Hypertension Sister    Hypertension Brother     Social History: Social History   Socioeconomic History   Marital status: Significant Other    Spouse name: Not  on file   Number of children: 2   Years of education: Not on file   Highest education level: Some college, no degree  Occupational History   Not on file  Tobacco Use   Smoking status: Never   Smokeless tobacco: Never  Vaping Use   Vaping Use: Never used  Substance and Sexual  Activity   Alcohol use: No   Drug use: No   Sexual activity: Not Currently  Other Topics Concern   Not on file  Social History Narrative   Pt lives in [place] with spouse.  2 grown children.   Previously worked in [employer] at [employer].  Now on disability   Social Determinants of Health   Financial Resource Strain: Medium Risk   Difficulty of Paying Living Expenses: Somewhat hard  Food Insecurity: No Food Insecurity   Worried About Charity fundraiser in the Last Year: Never true   Ran Out of Food in the Last Year: Never true  Transportation Needs: Unmet Transportation Needs   Lack of Transportation (Medical): Yes   Lack of Transportation (Non-Medical): No  Physical Activity: Not on file  Stress: Not on file  Social Connections: Not on file    Allergies:  Allergies  Allergen Reactions   Sulfa Antibiotics Itching    Objective:    Vital Signs:   Temp:  [97.3 F (36.3 C)] 97.3 F (36.3 C) (07/08 0857) Pulse Rate:  [113-136] 135 (07/08 1215) Resp:  [17-25] 22 (07/08 1215) BP: (99-121)/(58-96) 105/80 (07/08 1215) SpO2:  [98 %-100 %] 100 % (07/08 1215) Weight:  [123.8 kg] 123.8 kg (07/08 0858)   Filed Weights   02/25/21 0858  Weight: 123.8 kg     Physical Exam     General:   No respiratory difficulty HEENT: Normal Neck: Supple. JVP to jaw  Carotids 2+ bilat; no bruits. No lymphadenopathy or thyromegaly appreciated. Cor: PMI nondisplaced. Tachy Regular rate & rhythm. No rubs, gallops or murmurs. Lungs: Clear Abdomen: obese, soft, nontender, nondistended. No hepatosplenomegaly. No bruits or masses. Good bowel sounds. Extremities: No cyanosis, clubbing, rash, edema Neuro: Alert & oriented x 3, cranial nerves grossly intact. moves all 4 extremities w/o difficulty. Affect pleasant.   Telemetry   A fib RVR 130s   EKG   A flutter 135 bpm personally reviewed.   Labs     Basic Metabolic Panel: Recent Labs  Lab 02/23/21 1011 02/25/21 0909  NA 138 139  K  3.6 3.6  CL 98 99  CO2 34* 30  GLUCOSE 149* 202*  BUN 29* 26*  CREATININE 2.30* 2.21*  CALCIUM 9.3 8.9  MG  --  1.6*    Liver Function Tests: No results for input(s): AST, ALT, ALKPHOS, BILITOT, PROT, ALBUMIN in the last 168 hours. No results for input(s): LIPASE, AMYLASE in the last 168 hours. No results for input(s): AMMONIA in the last 168 hours.  CBC: Recent Labs  Lab 02/25/21 0909  WBC 6.7  NEUTROABS 3.7  HGB 10.1*  HCT 33.3*  MCV 90.7  PLT 159    Cardiac Enzymes: No results for input(s): CKTOTAL, CKMB, CKMBINDEX, TROPONINI in the last 168 hours.  BNP: BNP (last 3 results) Recent Labs    02/07/21 2105 02/23/21 1011 02/25/21 0909  BNP 74.1 204.3* 162.7*    ProBNP (last 3 results) No results for input(s): PROBNP in the last 8760 hours.   CBG: No results for input(s): GLUCAP in the last 168 hours.  Coagulation Studies: Recent Labs  02/25/21 0909  LABPROT 20.7*  INR 1.8*    Imaging: DG Chest Portable 1 View  Result Date: 02/25/2021 CLINICAL DATA:  Tachycardia, chest pain tachycardia EXAM: PORTABLE CHEST 1 VIEW COMPARISON:  None. FINDINGS: Midline sternotomy. Atrial clip noted. Stable cardiomegaly. Mild central venous congestion. Venous congestion slightly increased. No consolidation. No pneumothorax. IMPRESSION: Mild increase in central venous congestion. Electronically Signed   By: Suzy Bouchard M.D.   On: 02/25/2021 09:50     Assessment/Plan  A/C Combined Systolic Heart Failure NICM. ECHO after valve surgery showed EF 55%. ECHO repeated in ED and showed EF now down to ~ 35% suspect tachy mediated. Need to try and restore SR.  - Volume overloaded. Start lasix 80 mg twice a day and try to cardiovert Monday.  - Supp K and Mag  2. A Fib RVR Had MAZE 12/2020. Has had multiple DC-CVs. Most recent DC-CV 02/09/21 -->NSR - Unfortunately she has been out of amio for 7 days. Reload on IV amiodarone.  - She has been taking elqiuis 5 mg twice a day and we  will continue.  - EP following. No plan for ablation until at least 3 months after MAZE.  - Will set up cardioversion on Monday.   3. CKD Stage IIIb Creatinine baseline 1.8-2.1  Admit creatinine 2.2 Follow bmet daily  4. S/P MVR/TVR 12/2020   5. DMII Start sliding scale.   6. Obesity    Admit to progressive care.      Darrick Grinder, NP 02/25/2021, 12:33 PM  Advanced Heart Failure Team Pager [phone] (M-F; 7a - 5p)  Please contact [facility] for night-coverage after hours (4p -7a ) and weekends on [email or web address]  Patient seen with NP, agree with the above note.   Patient was seen in clinic this morning, was in atypical atrial flutter with 2:1 block and short of breath.  She had been out of amiodarone x 7 days.   She was started on amiodarone gtt in the ER.  HR in 100s currently.  She has not missed any Eliquis doses.   General: NAD Neck: JVP 14-16 cm, no thyromegaly or thyroid nodule.  Lungs: Clear to auscultation bilaterally with normal respiratory effort. CV: Nondisplaced PMI.  Heart mildly tachy, irregular S1/S2, no S3/S4, no murmur.  1+ ankle edema.  No carotid bruit.  Normal pedal pulses.  Abdomen: Soft, nontender, no hepatosplenomegaly, no distention.  Skin: Intact without lesions or rashes.  Neurologic: Alert and oriented x 3.  Psych: Normal affect. Extremities: No clubbing or cyanosis.  HEENT: Normal.   1. Atrial flutter: Patient appears to be in atypical atrial flutter with RVR.  She was out of amiodarone for about a week.  She had Maze in 5/22 and DCCV in 6/22.  - Restarted amiodarone gtt.  - Continue Eliquis.  - Will arrange for DCCV on Monday after diuresis.  2. Acute systolic CHF: Prior echo with EF 55%.  However, I looked at echo today at bedside, EF appears to be in the 35% range and there is moderate RV dysfunction.  This may be tachycardia-mediated with atrial flutter.  On exam, she is volume overloaded with NYHA class IV symptoms. Creatinine 2.2, mildly  higher than usual baseline.  - Start Lasix 80 mg IV bid.  - Need to get her back into NSR after diuresis (see above).  3. CKD stage 3: Creatinine 2.2, mildly higher than baseline.  Watch closely with diuresis.  4. S/p MV and TV repairs in 5/22: Valves look stable on  echo today.   Loralie Champagne 02/25/2021 3:05 PM

## 2021-02-25 NOTE — ED Triage Notes (Signed)
Patient arrives POV from [facility] for tachycardia. Patient has been dealing with this intermittently for a few months.Patient stated she felt like her heart was beating out of her chest all night. When she was seen at the clinic they did an EKG and saw she had a HR in the 140s. Pt denies chest pain. Endorses epigastric abd pain with nausea. No vomitting. Pt Aox4. Pt was recently cardioverted and has been multiple times per her.

## 2021-02-25 NOTE — ED Provider Notes (Addendum)
[facility] EMERGENCY DEPARTMENT Provider Note   CSN: [id number] Arrival date & time: 02/25/21  8659     History Chief Complaint  Patient presents with   Tachycardia    [name] is a 65 y.o. female.  Presents to ER with concern for tachycardia.  Patient reports that over the last couple weeks she has had sensation of feeling generally unwell.  Does not have palpitations.  Went to cardiology clinic today and was told that her heart rate was in the 130s and recommended going to ER.  Patient has very extensive cardiac history.  Has extensive history of atrial fibrillation that is difficult to control.  Has been on many different medication regimens.  Currently is on amiodarone and metoprolol.  Review of chart, reviewed note from cardiology from this morning.  HPI     Past Medical History:  Diagnosis Date   Allergic rhinitis    Arthritis    Asthma    Chest pain 12/27/2017   Chronic diastolic CHF (congestive heart failure) (HCC)    COPD (chronic obstructive pulmonary disease) (HCC)    Depression    DM (diabetes mellitus) (HCC)    DVT (deep vein thrombosis) in pregnancy    Dysrhythmia    atrial fibrilation   GERD (gastroesophageal reflux disease)    Headache(784.0)    HTN (hypertension)    Hyperlipidemia    Obesity    Pneumonia 04/2018   RIGHT LOBE   Shortness of breath    Sleep apnea    compliant with CPAP    Patient Active Problem List   Diagnosis Date Noted   Acute on chronic systolic heart failure (HCC) 02/25/2021   Atypical atrial flutter (HCC)    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   Acute exacerbation of CHF (congestive heart failure) (HCC) 01/29/2021   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   Encounter for preoperative dental examination    Teeth missing    Gingivitis    Accretions on teeth    CHF exacerbation (HCC) 12/21/2020   Acute on chronic heart failure (HCC) 12/19/2020   Atrial fibrillation, permanent (HCC) 11/02/2020    Chronic combined systolic and diastolic heart failure (HCC) 05/06/2020   NICM (nonischemic cardiomyopathy) (HCC) 04/01/2020   Family history of heart disease 04/01/2020   Mitral regurgitation 04/01/2020   Severe tricuspid regurgitation 04/01/2020   Closed right ankle fracture 09/24/2019   DKA, type 2 (HCC) 09/24/2019   HHNC (hyperglycemic hyperosmolar nonketotic coma) (HCC) 02/21/2019   Hypokalemia 02/21/2019   Acute kidney injury superimposed on CKD (HCC) 02/21/2019   Acute on chronic left systolic heart failure (HCC) 07/01/2018   Congestive heart failure with left ventricular diastolic dysfunction, acute (HCC) 07/01/2018   Right upper lobe pneumonia 05/14/2018   Atrial fibrillation with RVR (HCC) 05/14/2018   Chronic atrial fibrillation (HCC) 12/26/2017   Diastolic dysfunction 12/26/2017   Diabetes mellitus type 2 in obese (HCC) 12/26/2017   Nausea and vomiting    Chest pain 02/26/2012   OSA (obstructive sleep apnea) 09/21/2011   Hypoxia 09/21/2011   Diabetes mellitus (HCC) 02/04/2010   Morbid obesity (HCC) 02/04/2010   AF (paroxysmal atrial fibrillation) (HCC) 02/04/2010   Gastroparesis 02/04/2010   Acute gastroenteritis 02/04/2010    Past Surgical History:  Procedure Laterality Date   ABDOMINAL HYSTERECTOMY     BUBBLE STUDY  11/26/2020   Procedure: BUBBLE STUDY;  Surgeon: Jodelle Red, MD;  Location: [facility] ENDOSCOPY;  Service: Cardiovascular;;   CARDIOVERSION N/A 05/06/2020   Procedure: CARDIOVERSION;  Surgeon: Buford Dresser, MD;  Location: [place];  Service: Cardiovascular;  Laterality: N/A;   CARDIOVERSION N/A 11/04/2020   Procedure: CARDIOVERSION;  Surgeon: Lelon Perla, MD;  Location: [facility] ENDOSCOPY;  Service: Cardiovascular;  Laterality: N/A;   CARDIOVERSION N/A 02/01/2021   Procedure: CARDIOVERSION;  Surgeon: Jolaine Artist, MD;  Location: [place];  Service: Cardiovascular;  Laterality: N/A;   CARDIOVERSION N/A 02/09/2021   Procedure:  CARDIOVERSION;  Surgeon: Jolaine Artist, MD;  Location: [facility] ENDOSCOPY;  Service: Cardiovascular;  Laterality: N/A;   CATARACT EXTRACTION, BILATERAL     CHOLECYSTECTOMY     COLONOSCOPY WITH PROPOFOL N/A 03/05/2015   Procedure: COLONOSCOPY WITH PROPOFOL;  Surgeon: Carol Ada, MD;  Location: [facility] ENDOSCOPY;  Service: Endoscopy;  Laterality: N/A;   DIAGNOSTIC LAPAROSCOPY     ingrown hallux Left    KNEE SURGERY     LEFT HEART CATH AND CORONARY ANGIOGRAPHY N/A 12/31/2017   Procedure: LEFT HEART CATH AND CORONARY ANGIOGRAPHY;  Surgeon: Charolette Forward, MD;  Location: [place] CV LAB;  Service: Cardiovascular;  Laterality: N/A;   MAZE N/A 12/29/2020   Procedure: MAZE;  Surgeon: [name], MD;  Location: [place];  Service: Open Heart Surgery;  Laterality: N/A;   MITRAL VALVE REPAIR N/A 12/29/2020   Procedure: MITRAL VALVE REPAIR USING CARBOMEDICS ANNULOFLEX RING SIZE 30;  Surgeon: [name], MD;  Location: [place];  Service: Open Heart Surgery;  Laterality: N/A;   RIGHT HEART CATH N/A 12/22/2020   Procedure: RIGHT HEART CATH;  Surgeon: Larey Dresser, MD;  Location: [place] CV LAB;  Service: Cardiovascular;  Laterality: N/A;   RIGHT HEART CATH N/A 01/31/2021   Procedure: RIGHT HEART CATH;  Surgeon: Jolaine Artist, MD;  Location: [place] CV LAB;  Service: Cardiovascular;  Laterality: N/A;   TEE WITHOUT CARDIOVERSION N/A 11/26/2020   Procedure: TRANSESOPHAGEAL ECHOCARDIOGRAM (TEE);  Surgeon: Buford Dresser, MD;  Location: [facility] ENDOSCOPY;  Service: Cardiovascular;  Laterality: N/A;   TEE WITHOUT CARDIOVERSION N/A 12/29/2020   Procedure: TRANSESOPHAGEAL ECHOCARDIOGRAM (TEE);  Surgeon: [name], MD;  Location: [place];  Service: Open Heart Surgery;  Laterality: N/A;   TRICUSPID VALVE REPLACEMENT N/A 12/29/2020   Procedure: 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 RING SIZE 34;  Surgeon: [name], MD;  Location: [place];  Service: Open Heart  Surgery;  Laterality: N/A;     OB History   No obstetric history on file.     Family History  Problem Relation Age of Onset   Cancer Mother    Hypertension Mother    Cancer Father    CAD Other    Hypertension Sister    Hypertension Brother     Social History   Tobacco Use   Smoking status: Never   Smokeless tobacco: Never  Vaping Use   Vaping Use: Never used  Substance Use Topics   Alcohol use: No   Drug use: No    Home Medications Prior to Admission medications   Medication Sig Start Date End Date Taking? Authorizing Provider  albuterol (VENTOLIN HFA) 108 (90 Base) MCG/ACT inhaler Inhale 2 puffs into the lungs every 4 (four) hours as needed for wheezing or shortness of breath.  08/15/19  Yes [provider]  ALPRAZolam Duanne Moron) 0.5 MG tablet Take 1 tablet (0.5 mg total) by mouth 2 (two) times daily as needed for anxiety. Patient taking differently: Take 0.5 mg by mouth daily. 02/15/21  Yes [name], NP  amiodarone ([place])  200 MG tablet Take 1 tablet (200 mg total) by mouth 2 (two) times daily. 02/23/21  Yes Milford, Maricela Bo, FNP  apixaban (ELIQUIS) 5 MG TABS tablet Take 1 tablet (5 mg total) by mouth 2 (two) times daily. 11/09/20  Yes Buford Dresser, MD  colchicine 0.6 MG tablet Take 0.5 tablets (0.3 mg total) by mouth daily. 02/11/21  Yes Patrecia Pour, MD  docusate sodium (COLACE) 100 MG capsule Take 100 mg by mouth 2 (two) times daily.   Yes [provider]  DULoxetine (CYMBALTA) 60 MG capsule Take 1 capsule (60 mg total) by mouth daily. 09/24/20  Yes [name], NP  EPINEPHrine 0.3 mg/0.3 mL IJ SOAJ injection Inject 0.3 mg into the muscle as needed for anaphylaxis. 06/25/20  Yes [name], NP  insulin aspart (NOVOLOG) 100 UNIT/ML injection Inject 12 Units into the skin 3 (three) times daily before meals. For blood sugars 0-199 give 0 units of insulin, 201-250 give 4 units, 251-300 give 6 units, 301-350 give 8 units,  351-400 give 10 units,> 400 give 12 units and call M.D. Patient taking differently: Inject 5 Units into the skin 3 (three) times daily before meals. For blood sugars 0-199 give 0 units of insulin, 201-250 give 4 units, 251-300 give 6 units, 301-350 give 8 units, 351-400 give 10 units,> 400 give 12 units and call M.D. 06/24/20  Yes [name], NP  LEVEMIR 100 UNIT/ML injection INJECT 0.4 MLS (40 UNITS TOTAL) INTO THE SKIN 2 (TWO) TIMES DAILY. Patient taking differently: Inject 50 Units into the skin 2 (two) times daily. 12/01/20  Yes [name], NP  metoprolol succinate (TOPROL-XL) 25 MG 24 hr tablet Take 1 tablet (25 mg total) by mouth daily. 02/02/21 03/04/21 Yes Alma Friendly, MD  Multiple Vitamins-Minerals (MULTIVITAMIN WOMEN PO) Take 1 tablet by mouth daily.   Yes [provider]  nitroGLYCERIN (NITROSTAT) 0.4 MG SL tablet Place 1 tablet (0.4 mg total) under the tongue every 5 (five) minutes x 3 doses as needed for chest pain. 01/08/14  Yes Charolette Forward, MD  pantoprazole (PROTONIX) 40 MG tablet Take 40 mg by mouth 2 (two) times daily. 12/27/20  Yes [provider]  Potassium Chloride ER 20 MEQ TBCR Take 20 mEq by mouth 2 (two) times daily. Patient taking differently: Take 20 mEq by mouth daily. 02/23/21  Yes [place], Maricela Bo, FNP  RESTASIS 0.05 % ophthalmic emulsion Place 1 drop into both eyes 2 (two) times daily. 11/19/17  Yes [provider]  rosuvastatin (CRESTOR) 10 MG tablet Take 1 tablet (10 mg total) by mouth daily. 09/24/20  Yes [name], NP  torsemide (DEMADEX) 20 MG tablet Take 80 mg by mouth daily.   Yes [provider]  TRULICITY 1.5 AJ/2.8NO SOPN INJECT 1.5 MG INTO THE SKIN ONCE A WEEK. 12/05/20  Yes [name], NP  Accu-Chek Softclix Lancets lancets Use as instructed to check blood sugar three times daily. E11.69 04/21/20   Charlott Rakes, MD  Alcohol Swabs (B-D SINGLE USE SWABS REGULAR) PADS 1 each by Other route  daily. 02/19/19   [provider]  B-D ULTRAFINE III SHORT PEN 31G X 8 MM MISC 1 each by Other route in the morning, at noon, in the evening, and at bedtime. 11/18/20   [provider]  blood glucose meter kit and supplies KIT Dispense based on patient and insurance preference. Use up to four times daily as directed. 02/10/21   Patrecia Pour,  MD  Blood Glucose Monitoring Suppl (ACCU-CHEK GUIDE ME) w/Device KIT Use as instructed to check blood sugar three times daily. E11.69 04/21/20   Charlott Rakes, MD  Blood Glucose Monitoring Suppl (ONETOUCH VERIO) w/Device KIT Use to check blood sugar TID. E11.69 02/04/21   Charlott Rakes, MD  glucose blood (ONETOUCH VERIO) test strip Use to check blood sugar TID. E11.69 02/04/21   Charlott Rakes, MD  OneTouch Delica Lancets 13K MISC Use to check blood sugar TID.  E11.69 02/04/21   Charlott Rakes, MD  traMADol (ULTRAM) 50 MG tablet Take 1 tablet (50 mg total) by mouth every 8 (eight) hours as needed. Patient not taking: Reported on 02/25/2021 02/23/21 02/23/22  Antony Odea, PA-C    Allergies    Sulfa antibiotics  Review of Systems   Review of Systems  Constitutional:  Positive for fatigue. Negative for chills and fever.  HENT:  Negative for ear pain and sore throat.   Eyes:  Negative for pain and visual disturbance.  Respiratory:  Negative for cough and shortness of breath.   Cardiovascular:  Positive for palpitations. Negative for chest pain.  Gastrointestinal:  Negative for abdominal pain and vomiting.  Genitourinary:  Negative for dysuria and hematuria.  Musculoskeletal:  Negative for arthralgias and back pain.  Skin:  Negative for color change and rash.  Neurological:  Positive for weakness. Negative for seizures and syncope.  All other systems reviewed and are negative.  Physical Exam Updated Vital Signs BP (!) 133/101   Pulse (!) 136   Temp 98.7 F (37.1 C) (Oral)   Resp (!) 25   Ht _0  (1.702 m)   Wt 124.8 kg   SpO2  99%   BMI 43.09 kg/m   Physical Exam Vitals and nursing note reviewed.  Constitutional:      General: She is not in acute distress.    Appearance: She is well-developed.  HENT:     Head: Normocephalic and atraumatic.  Eyes:     Conjunctiva/sclera: Conjunctivae normal.  Cardiovascular:     Rate and Rhythm: Regular rhythm. Tachycardia present.     Heart sounds: No murmur heard. Pulmonary:     Effort: Pulmonary effort is normal. No respiratory distress.     Breath sounds: Normal breath sounds.  Abdominal:     Palpations: Abdomen is soft.     Tenderness: There is no abdominal tenderness.  Musculoskeletal:        General: No deformity or signs of injury.     Cervical back: Neck supple.  Skin:    General: Skin is warm and dry.  Neurological:     General: No focal deficit present.     Mental Status: She is alert.  Psychiatric:        Mood and Affect: Mood normal.    ED Results / Procedures / Treatments   Labs (all labs ordered are listed, but only abnormal results are displayed) Labs Reviewed  BRAIN NATRIURETIC PEPTIDE - Abnormal; Notable for the following components:      Result Value   B Natriuretic Peptide 162.7 (*)    All other components within normal limits  MAGNESIUM - Abnormal; Notable for the following components:   Magnesium 1.6 (*)    All other components within normal limits  CBC WITH DIFFERENTIAL/PLATELET - Abnormal; Notable for the following components:   RBC 3.67 (*)    Hemoglobin 10.1 (*)    HCT 33.3 (*)    RDW 16.1 (*)    All other components  within normal limits  BASIC METABOLIC PANEL - Abnormal; Notable for the following components:   Glucose, Bld 202 (*)    BUN 26 (*)    Creatinine, Ser 2.21 (*)    GFR, Estimated 24 (*)    All other components within normal limits  PROTIME-INR - Abnormal; Notable for the following components:   Prothrombin Time 20.7 (*)    INR 1.8 (*)    All other components within normal limits  GLUCOSE, CAPILLARY - Abnormal;  Notable for the following components:   Glucose-Capillary 208 (*)    All other components within normal limits  BASIC METABOLIC PANEL - Abnormal; Notable for the following components:   Glucose, Bld 206 (*)    BUN 25 (*)    Creatinine, Ser 2.04 (*)    GFR, Estimated 27 (*)    All other components within normal limits  GLUCOSE, CAPILLARY - Abnormal; Notable for the following components:   Glucose-Capillary 268 (*)    All other components within normal limits  GLUCOSE, CAPILLARY - Abnormal; Notable for the following components:   Glucose-Capillary 243 (*)    All other components within normal limits  RESP PANEL BY RT-PCR (FLU A&B, COVID) [id number]    EKG None EKG performed at 8:26 AM on February 25, 2021 shows atrial flutter with incomplete right bundle branch block, heart rate 136    Radiology DG Chest Portable 1 View  Result Date: 02/25/2021 CLINICAL DATA:  Tachycardia, chest pain tachycardia EXAM: PORTABLE CHEST 1 VIEW COMPARISON:  None. FINDINGS: Midline sternotomy. Atrial clip noted. Stable cardiomegaly. Mild central venous congestion. Venous congestion slightly increased. No consolidation. No pneumothorax. IMPRESSION: Mild increase in central venous congestion. Electronically Signed   By: Suzy Bouchard M.D.   On: 02/25/2021 09:50   ECHOCARDIOGRAM COMPLETE  Result Date: 02/25/2021    ECHOCARDIOGRAM REPORT   Patient Name:   [name] Date of Exam: 02/25/2021 Medical Rec #:  [id number]            Height:       67.0 in Accession #:    [id number]           Weight:       273.0 lb Date of Birth:  [birth year]            BSA:          2.308 m Patient Age:    85 years             BP:           105/80 mmHg Patient Gender: F                    HR:           126 bpm. Exam Location:  Inpatient Procedure: 2D Echo, Cardiac Doppler and Color Doppler Indications:    Congestive Heart Failure I50.9  History:        Patient has prior history of Echocardiogram examinations. CHF,                 COPD,  Arrythmias:Atrial Fibrillation, Signs/Symptoms:Shortness                 of Breath and Chest Pain; Risk Factors:Hypertension, Diabetes                 and Dyslipidemia. GERD. MITRAL VALVE REPAIR USING CARBOMEDICS                 ANNULOFLEX RING SIZE 30; TRICUSPID VALVE  REPAIR WITH EDWARDS MC3                 TRICUSPID RING SIZE 34, 12/29/2020.  Sonographer:    Vickie Epley RDCS Referring Phys: [phone] AMY D CLEGG IMPRESSIONS  1. Left ventricular ejection fraction, by estimation, is 30 to 35%. The left ventricle has moderately decreased function. The left ventricle demonstrates global hypokinesis. There is moderate left ventricular hypertrophy. Left ventricular diastolic parameters are indeterminate.  2. Right ventricular systolic function is moderately reduced. The right ventricular size is mildly enlarged. There is moderately elevated pulmonary artery systolic pressure. The estimated right ventricular systolic pressure is 01.6 mmHg.  3. Left atrial size was moderately dilated.  4. Right atrial size was mildly dilated.  5. The mitral valve has been repaired/replaced. There is a prosthetic annuloplasty ring present in the mitral position. Mild mitral valve regurgitation. Moderate to severe mitral stenosis. Mean gradient 10 mmHg at HR 129 bpm.  6. The tricuspid valve is has been repaired/replaced. The tricuspid valve is status post repair with an annuloplasty ring. Mean gradient 5 mmHg at HR 129 bpm. Tricuspid valve regurgitation is moderate.  7. The aortic valve is tricuspid. Aortic valve regurgitation is not visualized. No aortic stenosis is present.  8. The inferior vena cava is dilated in size with <50% respiratory variability, suggesting right atrial pressure of 15 mmHg. FINDINGS  Left Ventricle: Left ventricular ejection fraction, by estimation, is 30 to 35%. The left ventricle has moderately decreased function. The left ventricle demonstrates global hypokinesis. The left ventricular internal cavity size was  normal in size. There is moderate left ventricular hypertrophy. Left ventricular diastolic parameters are indeterminate. Right Ventricle: The right ventricular size is mildly enlarged. Right vetricular wall thickness was not well visualized. Right ventricular systolic function is moderately reduced. There is moderately elevated pulmonary artery systolic pressure. The tricuspid regurgitant velocity is 2.88 m/s, and with an assumed right atrial pressure of 15 mmHg, the estimated right ventricular systolic pressure is 01.0 mmHg. Left Atrium: Left atrial size was moderately dilated. Right Atrium: Right atrial size was mildly dilated. Pericardium: There is no evidence of pericardial effusion. Mitral Valve: The mitral valve has been repaired/replaced. Mild mitral valve regurgitation. There is a prosthetic annuloplasty ring present in the mitral position. Moderate to severe mitral valve stenosis. MV peak gradient, 13.5 mmHg. The mean mitral valve gradient is 9.0 mmHg. Tricuspid Valve: The tricuspid valve is has been repaired/replaced. Tricuspid valve regurgitation is moderate. The tricuspid valve is status post repair with an annuloplasty ring. Aortic Valve: The aortic valve is tricuspid. Aortic valve regurgitation is not visualized. No aortic stenosis is present. Pulmonic Valve: The pulmonic valve was not well visualized. Pulmonic valve regurgitation is trivial. Aorta: The aortic root is normal in size and structure. Venous: The inferior vena cava is dilated in size with less than 50% respiratory variability, suggesting right atrial pressure of 15 mmHg. IAS/Shunts: The interatrial septum was not well visualized.  LEFT VENTRICLE PLAX 2D LVOT diam:     1.50 cm LV SV:         22 LV SV Index:   9 LVOT Area:     1.77 cm  LV Volumes (MOD) LV vol d, MOD A2C: 117.0 ml LV vol d, MOD A4C: 139.0 ml LV vol s, MOD A2C: 83.2 ml LV vol s, MOD A4C: 89.4 ml LV SV MOD A2C:     33.8 ml LV SV MOD A4C:     139.0 ml LV SV MOD BP:  43.8 ml  RIGHT VENTRICLE TAPSE (M-mode): 0.9 cm LEFT ATRIUM              Index       RIGHT ATRIUM           Index LA Vol (A2C):   79.6 ml  34.49 ml/m RA Area:     25.90 cm LA Vol (A4C):   108.0 ml 46.79 ml/m RA Volume:   81.80 ml  35.44 ml/m LA Biplane Vol: 101.0 ml 43.76 ml/m  AORTIC VALVE LVOT Vmax:   87.90 cm/s LVOT Vmean:  57.000 cm/s LVOT VTI:    0.122 m  AORTA Ao Root diam: 2.90 cm MITRAL VALVE              TRICUSPID VALVE MV Area VTI:  0.90 cm    TR Peak grad:   33.2 mmHg MV Peak grad: 13.5 mmHg   TR Vmax:        288.00 cm/s MV Mean grad: 9.0 mmHg MV Vmax:      1.84 m/s    SHUNTS MV Vmean:     139.0 cm/s  Systemic VTI:  0.12 m MR Peak grad: 69.9 mmHg   Systemic Diam: 1.50 cm MR Mean grad: 46.0 mmHg MR Vmax:      418.00 cm/s MR Vmean:     331.0 cm/s Oswaldo Milian MD Electronically signed by Oswaldo Milian MD Signature Date/Time: 02/25/2021/5:09:12 PM    Final     Procedures .Critical Care  Date/Time: 02/26/2021 8:59 AM Performed by: Lucrezia Starch, MD Authorized by: Lucrezia Starch, MD   Critical care provider statement:    Critical care time (minutes):  40   Critical care was time spent personally by me on the following activities:  Discussions with consultants, evaluation of patient's response to treatment, examination of patient, ordering and performing treatments and interventions, ordering and review of laboratory studies, ordering and review of radiographic studies, pulse oximetry, re-evaluation of patient's condition, obtaining history from patient or surrogate and review of old charts   Medications Ordered in ED Medications  amiodarone (NEXTERONE) 1.8 mg/mL load via infusion 150 mg (150 mg Intravenous Bolus from Bag 02/25/21 1239)    Followed by  amiodarone (NEXTERONE PREMIX) 360-4.14 MG/200ML-% (1.8 mg/mL) IV infusion (0 mg/hr Intravenous Stopped 02/25/21 1910)    Followed by  amiodarone (NEXTERONE PREMIX) 360-4.14 MG/200ML-% (1.8 mg/mL) IV infusion (30 mg/hr Intravenous  Infusion Verify 02/26/21 0800)  rosuvastatin (CRESTOR) tablet 10 mg (10 mg Oral Given 02/25/21 1333)  DULoxetine (CYMBALTA) DR capsule 60 mg (0 mg Oral Hold 02/25/21 1544)  docusate sodium (COLACE) capsule 100 mg (100 mg Oral Given 02/25/21 2021)  pantoprazole (PROTONIX) EC tablet 40 mg (40 mg Oral Given 02/25/21 2021)  sodium chloride flush (NS) 0.9 % injection 3 mL (3 mLs Intravenous Given 02/25/21 2130)  sodium chloride flush (NS) 0.9 % injection 3 mL (has no administration in time range)  0.9 %  sodium chloride infusion (has no administration in time range)  acetaminophen (TYLENOL) tablet 650 mg (has no administration in time range)  ondansetron (ZOFRAN) injection 4 mg (4 mg Intravenous Given 02/26/21 0655)  furosemide (LASIX) injection 80 mg (80 mg Intravenous Given 02/25/21 1703)  apixaban (ELIQUIS) tablet 5 mg (5 mg Oral Given 02/25/21 2021)  insulin aspart (novoLOG) injection 0-20 Units (7 Units Subcutaneous Given 02/26/21 0658)  nitroGLYCERIN (NITROSTAT) 0.4 MG SL tablet (  Not Given 02/26/21 0107)  magnesium sulfate IVPB 2 g 50 mL (0 g Intravenous Stopped  02/25/21 1209)  potassium chloride SA (KLOR-CON) CR tablet 40 mEq (40 mEq Oral Given 02/25/21 1333)  nitroGLYCERIN (NITROSTAT) SL tablet 0.4 mg (0.4 mg Sublingual Given 02/26/21 0113)    ED Course  I have reviewed the triage vital signs and the nursing notes.  Pertinent labs & imaging results that were available during my care of the patient were reviewed by me and considered in my medical decision making (see chart for details).    MDM Rules/Calculators/A&P                          65 year old lady with history of heart failure, A. fib, valvular heart disease s/p recent mitral and tricuspid valve repair presented to ER with tachycardia.  On exam patient appears well in no acute distress.  She has stable blood pressure.  EKG concerning for atrial flutter with rapid ventricular rate.  Given her extensive history of difficult to control atrial fibrillation,  consulted cardiology to evaluate patient to provide recommendations regarding further management.  Their team came to bedside and evaluated patient, initiated IV amiodarone and admitted patient to their service for further management.  Final Clinical Impression(s) / ED Diagnoses Final diagnoses:  Atrial fibrillation with rapid ventricular response [facility])    Rx / DC Orders ED Discharge Orders     None        Lucrezia Starch, MD 02/26/21 1275    Lucrezia Starch, MD 02/26/21 [phone]

## 2021-02-25 NOTE — Progress Notes (Signed)
   Cardioversion set up for Monday 02/28/21 at 7:30 by Dr [name].   Mariangel Ringley NP-C  3:22 PM

## 2021-02-25 NOTE — Progress Notes (Signed)
Admission from the Ed by bed awake and alert.

## 2021-02-25 NOTE — Telephone Encounter (Signed)
I see she is in the ER now with afib. I am out of the office until 7/18 but we will make sure she gets followed closely. I know this has been rough for her since surgery.

## 2021-02-25 NOTE — Consult Note (Signed)
Cardiology Consultation:   Patient ID: [name] MRN: [id number]; DOB: 1956/08/18  Admit date: 02/25/2021 Date of Consult: 02/25/2021  PCP:  [name], NP   [facility] Providers Cardiologist:  Buford Dresser, MD  EP: Dr. Rayann Heman AHF: Dr. [name]   Patient Profile:   [name] is a 65 y.o. female with a hx of morbid obesity, chronic combined CHF, AFib desribed as persistent), VHD (s/p MV annuloplasty, TV annuloplasty and MAZE left and right atria, ans LAA clip on 12/30/20), DM, OSA w/CPAPwho is being seen 02/25/2021 for the evaluation of rapid AFib at the request of Dr. Roslynn Amble.  History of Present Illness:   [name] has hx of AFib described as persistent, goes back as far as I  can tell to at least 2019.  She was started on Tikosyn 11/03/20 had ERAF afterwards it seems and changed to amiodarone  April 2022 She subsequently underwent MV/TV repairs, L/R MAZE and LAA clip on 12/30/20  She has had trouble with HF along the way LVEF 60-65% on 01/02/21 02/01/21 DCCV > SR 02/07/21 admitted woth c/o CP, in AFib though rates 90's-100 Seems amiodarone has been on again off again by notes, this is unclear 02/09/22 DCCV  She saw HF team this AM more SOB and suspect to be volume OL, was in rapid AFib and referred to the ER. Here sounds like they offered a DCCV but she declined, states that she was told she should not have so many and not to have more. She reports compliance with her medicines except the amiodarone that her pharmacy did not fill it for her Probably has been without it about a week or so maybe less.  LABS K+ 3.6 Mag 1.6 BUN/Creat 26/2.21 (looks about her baseline post op) BNP 162 WBC 6.7 H/H 10/33 Plts 159  She thinks she has been back in AFib a couple weeks, her SOB worse since then, and that her rates have been fast all along   Past Medical History:  Diagnosis Date   Allergic rhinitis    Arthritis    Asthma    Chest  pain 12/27/2017   Chronic diastolic CHF (congestive heart failure) (HCC)    COPD (chronic obstructive pulmonary disease) ([place])    Depression    DM (diabetes mellitus) ([place])    DVT (deep vein thrombosis) in pregnancy    Dysrhythmia    atrial fibrilation   GERD (gastroesophageal reflux disease)    Headache(784.0)    HTN (hypertension)    Hyperlipidemia    Obesity    Pneumonia 04/2018   RIGHT LOBE   Shortness of breath    Sleep apnea    compliant with CPAP    Past Surgical History:  Procedure Laterality Date   ABDOMINAL HYSTERECTOMY     BUBBLE STUDY  11/26/2020   Procedure: BUBBLE STUDY;  Surgeon: Buford Dresser, MD;  Location: [place];  Service: Cardiovascular;;   CARDIOVERSION N/A 05/06/2020   Procedure: CARDIOVERSION;  Surgeon: Buford Dresser, MD;  Location: [facility] ENDOSCOPY;  Service: Cardiovascular;  Laterality: N/A;   CARDIOVERSION N/A 11/04/2020   Procedure: CARDIOVERSION;  Surgeon: Lelon Perla, MD;  Location: [facility] ENDOSCOPY;  Service: Cardiovascular;  Laterality: N/A;   CARDIOVERSION N/A 02/01/2021   Procedure: CARDIOVERSION;  Surgeon: Jolaine Artist, MD;  Location: [place];  Service: Cardiovascular;  Laterality: N/A;   CARDIOVERSION N/A 02/09/2021   Procedure: CARDIOVERSION;  Surgeon: Jolaine Artist, MD;  Location: [facility] ENDOSCOPY;  Service: Cardiovascular;  Laterality: N/A;   CATARACT  EXTRACTION, BILATERAL     CHOLECYSTECTOMY     COLONOSCOPY WITH PROPOFOL N/A 03/05/2015   Procedure: COLONOSCOPY WITH PROPOFOL;  Surgeon: Carol Ada, MD;  Location: [facility] ENDOSCOPY;  Service: Endoscopy;  Laterality: N/A;   DIAGNOSTIC LAPAROSCOPY     ingrown hallux Left    KNEE SURGERY     LEFT HEART CATH AND CORONARY ANGIOGRAPHY N/A 12/31/2017   Procedure: LEFT HEART CATH AND CORONARY ANGIOGRAPHY;  Surgeon: Charolette Forward, MD;  Location: [place] CV LAB;  Service: Cardiovascular;  Laterality: N/A;   MAZE N/A 12/29/2020   Procedure: MAZE;  Surgeon: [name], MD;  Location: [place];  Service: Open Heart Surgery;  Laterality: N/A;   MITRAL VALVE REPAIR N/A 12/29/2020   Procedure: MITRAL VALVE REPAIR USING CARBOMEDICS ANNULOFLEX RING SIZE 30;  Surgeon: [name], MD;  Location: [place];  Service: Open Heart Surgery;  Laterality: N/A;   RIGHT HEART CATH N/A 12/22/2020   Procedure: RIGHT HEART CATH;  Surgeon: Larey Dresser, MD;  Location: [place] CV LAB;  Service: Cardiovascular;  Laterality: N/A;   RIGHT HEART CATH N/A 01/31/2021   Procedure: RIGHT HEART CATH;  Surgeon: Jolaine Artist, MD;  Location: [place] CV LAB;  Service: Cardiovascular;  Laterality: N/A;   TEE WITHOUT CARDIOVERSION N/A 11/26/2020   Procedure: TRANSESOPHAGEAL ECHOCARDIOGRAM (TEE);  Surgeon: Buford Dresser, MD;  Location: [facility] ENDOSCOPY;  Service: Cardiovascular;  Laterality: N/A;   TEE WITHOUT CARDIOVERSION N/A 12/29/2020   Procedure: TRANSESOPHAGEAL ECHOCARDIOGRAM (TEE);  Surgeon: [name], MD;  Location: [place];  Service: Open Heart Surgery;  Laterality: N/A;   TRICUSPID VALVE REPLACEMENT N/A 12/29/2020   Procedure: 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 RING SIZE 34;  Surgeon: [name], MD;  Location: [place];  Service: Open Heart Surgery;  Laterality: N/A;     Home Medications:  Prior to Admission medications   Medication Sig Start Date End Date Taking? Authorizing Provider  albuterol (VENTOLIN HFA) 108 (90 Base) MCG/ACT inhaler Inhale 2 puffs into the lungs every 4 (four) hours as needed for wheezing or shortness of breath.  08/15/19  Yes [provider]  ALPRAZolam Duanne Moron) 0.5 MG tablet Take 1 tablet (0.5 mg total) by mouth 2 (two) times daily as needed for anxiety. Patient taking differently: Take 0.5 mg by mouth daily. 02/15/21  Yes [name], NP  amiodarone (PACERONE) 200 MG tablet Take 1 tablet (200 mg total) by mouth 2 (two) times daily. 02/23/21  Yes [place], Maricela Bo, FNP  apixaban  (ELIQUIS) 5 MG TABS tablet Take 1 tablet (5 mg total) by mouth 2 (two) times daily. 11/09/20  Yes Buford Dresser, MD  colchicine 0.6 MG tablet Take 0.5 tablets (0.3 mg total) by mouth daily. 02/11/21  Yes Patrecia Pour, MD  docusate sodium (COLACE) 100 MG capsule Take 100 mg by mouth 2 (two) times daily.   Yes [provider]  DULoxetine (CYMBALTA) 60 MG capsule Take 1 capsule (60 mg total) by mouth daily. 09/24/20  Yes [name], NP  EPINEPHrine 0.3 mg/0.3 mL IJ SOAJ injection Inject 0.3 mg into the muscle as needed for anaphylaxis. 06/25/20  Yes [name], NP  insulin aspart (NOVOLOG) 100 UNIT/ML injection Inject 12 Units into the skin 3 (three) times daily before meals. For blood sugars 0-199 give 0 units of insulin, 201-250 give 4 units, 251-300 give 6 units, 301-350 give 8 units, 351-400 give 10 units,> 400 give 12 units and call  M.D. Patient taking differently: Inject 5 Units into the skin 3 (three) times daily before meals. For blood sugars 0-199 give 0 units of insulin, 201-250 give 4 units, 251-300 give 6 units, 301-350 give 8 units, 351-400 give 10 units,> 400 give 12 units and call M.D. 06/24/20  Yes [name], NP  LEVEMIR 100 UNIT/ML injection INJECT 0.4 MLS (40 UNITS TOTAL) INTO THE SKIN 2 (TWO) TIMES DAILY. Patient taking differently: Inject 50 Units into the skin 2 (two) times daily. 12/01/20  Yes [name], NP  metoprolol succinate (TOPROL-XL) 25 MG 24 hr tablet Take 1 tablet (25 mg total) by mouth daily. 02/02/21 03/04/21 Yes Alma Friendly, MD  Multiple Vitamins-Minerals (MULTIVITAMIN WOMEN PO) Take 1 tablet by mouth daily.   Yes [provider]  nitroGLYCERIN (NITROSTAT) 0.4 MG SL tablet Place 1 tablet (0.4 mg total) under the tongue every 5 (five) minutes x 3 doses as needed for chest pain. 01/08/14  Yes Charolette Forward, MD  pantoprazole (PROTONIX) 40 MG tablet Take 40 mg by mouth 2 (two) times daily. 12/27/20  Yes [provider]  Potassium Chloride ER 20 MEQ TBCR Take 20 mEq by mouth 2 (two) times daily. Patient taking differently: Take 20 mEq by mouth daily. 02/23/21  Yes [place], Maricela Bo, FNP  RESTASIS 0.05 % ophthalmic emulsion Place 1 drop into both eyes 2 (two) times daily. 11/19/17  Yes [provider]  rosuvastatin (CRESTOR) 10 MG tablet Take 1 tablet (10 mg total) by mouth daily. 09/24/20  Yes [name], NP  torsemide (DEMADEX) 20 MG tablet Take 80 mg by mouth daily.   Yes [provider]  TRULICITY 1.5 DU/2.0UR SOPN INJECT 1.5 MG INTO THE SKIN ONCE A WEEK. 12/05/20  Yes [name], NP  Accu-Chek Softclix Lancets lancets Use as instructed to check blood sugar three times daily. E11.69 04/21/20   Charlott Rakes, MD  Alcohol Swabs (B-D SINGLE USE SWABS REGULAR) PADS 1 each by Other route daily. 02/19/19   [provider]  B-D ULTRAFINE III SHORT PEN 31G X 8 MM MISC 1 each by Other route in the morning, at noon, in the evening, and at bedtime. 11/18/20   [provider]  blood glucose meter kit and supplies KIT Dispense based on patient and insurance preference. Use up to four times daily as directed. 02/10/21   Patrecia Pour, MD  Blood Glucose Monitoring Suppl (ACCU-CHEK GUIDE ME) w/Device KIT Use as instructed to check blood sugar three times daily. E11.69 04/21/20   Charlott Rakes, MD  Blood Glucose Monitoring Suppl (ONETOUCH VERIO) w/Device KIT Use to check blood sugar TID. E11.69 02/04/21   Charlott Rakes, MD  glucose blood (ONETOUCH VERIO) test strip Use to check blood sugar TID. E11.69 02/04/21   Charlott Rakes, MD  OneTouch Delica Lancets 42H MISC Use to check blood sugar TID.  E11.69 02/04/21   Charlott Rakes, MD  traMADol (ULTRAM) 50 MG tablet Take 1 tablet (50 mg total) by mouth every 8 (eight) hours as needed. Patient not taking: Reported on 02/25/2021 02/23/21 02/23/22  Antony Odea, PA-C    Inpatient Medications: Scheduled Meds:  Continuous  Infusions:  magnesium sulfate bolus IVPB     PRN Meds:   Allergies:    Allergies  Allergen Reactions   Sulfa Antibiotics Itching    Social History:   Social History   Socioeconomic History   Marital status: Significant Other    Spouse name: Not on file  Number of children: 2   Years of education: Not on file   Highest education level: Some college, no degree  Occupational History   Not on file  Tobacco Use   Smoking status: Never   Smokeless tobacco: Never  Vaping Use   Vaping Use: Never used  Substance and Sexual Activity   Alcohol use: No   Drug use: No   Sexual activity: Not Currently  Other Topics Concern   Not on file  Social History Narrative   Pt lives in [place] with spouse.  2 grown children.   Previously worked in [employer] at [employer].  Now on disability   Social Determinants of Health   Financial Resource Strain: Medium Risk   Difficulty of Paying Living Expenses: Somewhat hard  Food Insecurity: No Food Insecurity   Worried About Charity fundraiser in the Last Year: Never true   Ran Out of Food in the Last Year: Never true  Transportation Needs: Unmet Transportation Needs   Lack of Transportation (Medical): Yes   Lack of Transportation (Non-Medical): No  Physical Activity: Not on file  Stress: Not on file  Social Connections: Not on file  Intimate Partner Violence: Not on file    Family History:   Family History  Problem Relation Age of Onset   Cancer Mother    Hypertension Mother    Cancer Father    CAD Other    Hypertension Sister    Hypertension Brother      ROS:  Please see the history of present illness.  All other ROS reviewed and negative.     Physical Exam/Data:   Vitals:   02/25/21 0945 02/25/21 1000 02/25/21 1030 02/25/21 1045  BP: (!) 114/96 105/74 107/81 99/84  Pulse: (!) 134 (!) 135 (!) 125 (!) 133  Resp: $Remo'19 20 17 18  'PDBLs$ Temp:      TempSrc:      SpO2: 100% 100% 100% 100%  Weight:      Height:       No intake or  output data in the 24 hours ending 02/25/21 1116 Last 3 Weights 02/25/2021 02/25/2021 02/23/2021  Weight (lbs) 273 lb 273 lb 3.2 oz 275 lb  Weight (kg) 123.832 kg 123.923 kg 124.739 kg  Some encounter information is confidential and restricted. Go to Review Flowsheets activity to see all data.     Body mass index is 42.76 kg/m.  General:  Well nourished, well developed, in no acute distress HEENT: normal Lymph: no adenopathy Neck: no JVD Endocrine:  No thryomegaly Vascular: No carotid bruits  Cardiac:  irreg-irreg, tachycardic; no murmurs Lungs:  b/l crackles to about mid-lung, no wheezing, no wheezing, rhonchi or rales  Abd: soft, nontender, obese Ext: no edema Musculoskeletal:  No deformities Skin: warm and dry  Neuro:  no focal abnormalities noted Psych:  Normal affect   EKG:  The EKG was personally reviewed and demonstrates:   All her EKGs are an AFlutter 130's  Telemetry:  Telemetry was personally reviewed and demonstrates:   More irregular, AFib/flutter  Relevant CV Studies:  01/02/21: TTE IMPRESSIONS   1. No subcostal views; Elevated mean gradient across aortic valve (mean  gradient 13 mmHg) but visually valve opens well; S/P MV repair with mean  gradient 7 mmHg and trace MR; S/P TV repair with moderate TR.   2. Left ventricular ejection fraction, by estimation, is 60 to 65%. The  left ventricle has normal function. The left ventricle has no regional  wall motion abnormalities.  There is moderate left ventricular hypertrophy.  There is the interventricular  septum is flattened in systole and diastole, consistent with right  ventricular pressure and volume overload.   3. Right ventricular systolic function is normal. The right ventricular  size is mildly enlarged.   4. Left atrial size was moderately dilated.   5. Right atrial size was mildly dilated.   6. The mitral valve has been repaired/replaced. Trivial mitral valve  regurgitation. No evidence of mitral stenosis.    7. The tricuspid valve is has been repaired/replaced. Tricuspid valve  regurgitation is moderate.   8. The aortic valve is tricuspid. Aortic valve regurgitation is not  visualized. No aortic stenosis is present.   Laboratory Data:  High Sensitivity Troponin:   Recent Labs  Lab 01/29/21 1104 01/29/21 1304 02/07/21 1440 02/07/21 1717  TROPONINIHS 20* 18* 11 12     Chemistry Recent Labs  Lab 02/23/21 1011 02/25/21 0909  NA 138 139  K 3.6 3.6  CL 98 99  CO2 34* 30  GLUCOSE 149* 202*  BUN 29* 26*  CREATININE 2.30* 2.21*  CALCIUM 9.3 8.9  GFRNONAA 23* 24*  ANIONGAP 6 10    No results for input(s): PROT, ALBUMIN, AST, ALT, ALKPHOS, BILITOT in the last 168 hours. Hematology Recent Labs  Lab 02/25/21 0909  WBC 6.7  RBC 3.67*  HGB 10.1*  HCT 33.3*  MCV 90.7  MCH 27.5  MCHC 30.3  RDW 16.1*  PLT 159   BNP Recent Labs  Lab 02/23/21 1011  BNP 204.3*    DDimer No results for input(s): DDIMER in the last 168 hours.   Radiology/Studies:  DG Chest 2 View  Result Date: 02/22/2021 CLINICAL DATA:  Status post mitral valve replacement. EXAM: CHEST - 2 VIEW COMPARISON:  Chest x-ray from February 07, 2021. FINDINGS: Trachea is midline. Cardiomediastinal contours and hilar structures with stable appearance. Median sternotomy changes for atrial appendage clipping and mitral valve repair and tricuspid valve replacement as before. Heart size remains enlarged. No consolidation.  No sign of pleural effusion. On limited assessment no acute skeletal process. IMPRESSION: No active cardiopulmonary disease. Cardiomegaly and signs of Maze procedure with valvular replacement/repair and median sternotomy as before. Electronically Signed   By: Zetta Bills M.D.   On: 02/22/2021 14:40   DG Chest Portable 1 View  Result Date: 02/25/2021 CLINICAL DATA:  Tachycardia, chest pain tachycardia EXAM: PORTABLE CHEST 1 VIEW COMPARISON:  None. FINDINGS: Midline sternotomy. Atrial clip noted. Stable  cardiomegaly. Mild central venous congestion. Venous congestion slightly increased. No consolidation. No pneumothorax. IMPRESSION: Mild increase in central venous congestion. Electronically Signed   By: Suzy Bouchard M.D.   On: 02/25/2021 09:50     Assessment and Plan:   Persistent AFib She comes today in an AFlutter CHA2DS2Vasc is 4, on eliquis appropriately dosed RVR Flutter most likely is atypical given hx She is VERY reluctant about another DCCV  Will start amio with bolus Her Mag is being replaced  I would not plan PPM AV node ablation just yet given only 28mo s/p valve repairs and MAZE I discussed with her and HF team Dr. Quentin Ore will see her later today  3. Acute on chronic CHF HFpEF, diastolic, historically combined AHF team is admitting Felt to be volume OL and planned for diuresis   Risk Assessment/Risk Scores:    For questions or updates, please contact [place] Please consult [email or web address] for contact info under    Signed, Baldwin Jamaica, PA-C  02/25/2021 11:16  AM

## 2021-02-25 NOTE — Telephone Encounter (Signed)
Aware. Thank you.

## 2021-02-25 NOTE — Progress Notes (Signed)
Advanced Heart Failure Clinic Note   PCP: [name], NP PCP-Cardiologist: Buford Dresser, MD  [facility]: Dr. 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   HPI:  [name] is a 65 year old female with morbid obesity, chronic diastolic and systolic HF with mildly reduced LVEF (45-50%), severe MR/TR, atrial fibrillation on eliquis, HTN, and DMII who is followed by Dr. Harrell Gave as an out-patient. She has a known history of HF with mildly reduced LVEF of 45-50% with cath in 2019 with mild, non-obstructive CAD. Also with history of difficult to control Afib with failed Tikosyn, later switched to amiodarone, dig, metop and apixaban for [facility]. Saw Dr. Harrell Gave in clinic on 11/22/20 where she was experiencing worsening SOB, weight gain, LE edema and chest pressure. She declined ER visit at that time. She was changed from lasix to torsemide for diuresis. Also planned for CV surgery evaluation for severe MR.    Admitted 12/19/20 with worsening HF.  LHC 5/19 non-obstructive CAD. RHC 12/22/20 with elevated filling pressures and low CI.    Underwent MV/TV repair with Maze on 12/30/20. Post-operatively, she struggled  with low co-ox, progressive renal failure and volume overload. AHF team was consulted to assist w/ further management. Post operative Echo showed LVEF 55% RV moderately HK. Moderate TR. No significant MR. No effusion. Hemodynamics improved w/ milrinone. Suspected of having post-op ATN. Nephrology assisted w/ transition back to PO diuretics. She was discharged on 5/28. D/w wt was 281 lb. SCr was 2.8. Nephrology recommended home on torsemide 60 mg daily. She was also in Afib day of d/c. Recommendations were to consider DC-CV in several weeks after atrial irritation settles down from Maze.   She was seen in clinic 01/25/21 for post hospital f/u  w/ daughter. Feels bad. Complaints of exertional dyspnea,  nausea/ vomiting, poor appetite and abdominal discomfort. Remains in Afib on EKG, HR 100. BP soft but no orthostatic symptoms. ReDs clip 42%. Plan for DCCV.  She was seen in ED 01/29/21 with SOB and CP, with decreased urinary output. She was admitted for A/CHF, diuresed with IV lasix, RHC showed low filling pressures and moderately reduced CO. She underwent successful DCCV 02/01/21. 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 She was discharged with CR referral and instructions to follow up with nephrology.  Seen in ED 02/07/21 for leg swelling & CP. She was back in Afib, rate controlled. She was loaded w amiodarone gtt, repeat successful DCCV on 02/09/21. 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   She returned 02/22/21 for post DCCV and HF follow up. She underwent successful DCCV 02/10/21 but now back in atrial fibrillation. Says she felt ok for longer this time after cardioversion. SOB with minimal activity. Uses electric cart at grocery store. Denies increasing CP, dizziness, or PND/Orthopnea. Appetite ok. No fever or chills. Weight at home 270 pounds. Taking all medications, however only taking tosemide 20 mg bid. 255 lbs at discharge 2 weeks ago. We increased torsemide to 80 mg bid (unable to have [place] come out and do IV lasix) and increased amio to 200 bid, and isntructed to come back to clinic Friday for eval.  Today she returns for HF follow up. Overall feeling worse, SOB with any activity. Still dizzy, swelling has not subsided. Feels heart is racing. Denies  increasing CP or PND/Orthopnea. Weight at home 272 pounds. Taking all medications.  Cardiac Studies - Echo (11/13): EF 60-65%, moderate TR - Echo (11/14): EF 60-65%, severe TR, PA peak pressure 47 mmHg - Echo (5/15): EF 50-55% - Exercise stress test (7/6): A. Fib. with controlled vent. response  OLD inferior MI and non sp. T wave changes at baseline. - Echo (8/17): EF 50-55%, moderate MR - Exercise stress test (8/17): Atrial Fib. Q wave in lead 3 and AVF with nonsp T wave changes at baseline - Echo (5/19): EF 55-60%, moderate MR - Exercise stress test (5/19): Afib, normal. - LHC (5/19): Prox LAD lesion is 10% stenosed, Dist LM to Ost LAD lesion is 10% stenosed, Ost Cx lesion is 15% stenosed, LV systolic function normal, LVEDP is normal. - Echo (11/19): EF 45-50%, severe MR/TR - Echo (11/21): EF 45-50%, severe MR (rheumatic)/TR - Echo (4/22): EF 45-50%, severe MR mean gradient 2.0 mmHg, severe TR - Echo (5/22): EF 60-65%, s/p MVR, trace MR mean gradient 7.0 mmHg, moderate TR - RHC (5/22): Elevated R > L heart filling pressures with PAPi low but not markedly so (1.8) suggestive of a significant component of RV dysfunction. Prominent v-waves in PCWP and RA pressure tracings suggestive of significant MR and TR. Cardiac index low. - RHC (6/22): low filling pressures, moderate reduced CO.   RA = 5 RV = 33/2 PA = 35/13 (22) PCW = 13 Fick cardiac output/index = 4.3/2.0 PVR = 2.1 WU FA sat = 97% PA sat = 54%, 54% - DCCV (02/01/21): successful conversion to NSR. - DCCV (02/09/21): successful conversion to NSR.  ROS: All systems reviewed and negative except as per HPI.   Past Medical History:  Diagnosis Date   Allergic rhinitis    Arthritis    Asthma    Chest pain 12/27/2017   Chronic diastolic CHF (congestive heart failure) (HCC)    COPD (chronic obstructive pulmonary disease) (HCC)    Depression    DM (diabetes mellitus) ([place])    DVT (deep vein thrombosis) in pregnancy    Dysrhythmia    atrial fibrilation   GERD (gastroesophageal reflux disease)    Headache(784.0)    HTN (hypertension)    Hyperlipidemia    Obesity    Pneumonia 04/2018   RIGHT LOBE   Shortness of breath    Sleep apnea    compliant with CPAP   Current Outpatient Medications  Medication Sig Dispense  Refill   Accu-Chek Softclix Lancets lancets Use as instructed to check blood sugar three times daily. E11.69 100 each 12   albuterol (VENTOLIN HFA) 108 (90 Base) MCG/ACT inhaler Inhale 2 puffs into the lungs every 4 (four) hours as needed for wheezing or shortness of breath.      Alcohol Swabs (B-D SINGLE USE SWABS REGULAR) PADS 1 each by Other route daily.     ALPRAZolam (XANAX) 0.5 MG tablet Take 1 tablet (0.5 mg total) by mouth 2 (two) times daily as needed for anxiety. 30 tablet 0   amiodarone (PACERONE) 200 MG tablet Take 1 tablet (200 mg total) by mouth 2 (two) times daily. 60 tablet 3   apixaban (ELIQUIS) 5 MG TABS tablet Take 1 tablet (5 mg total) by mouth 2 (two) times daily. 180 tablet 1   B-D ULTRAFINE III SHORT PEN 31G X 8 MM MISC 1 each by Other route in the morning, at noon, in the evening, and at bedtime.     blood glucose meter kit and supplies KIT Dispense  based on patient and insurance preference. Use up to four times daily as directed. 1 each 0   Blood Glucose Monitoring Suppl (ACCU-CHEK GUIDE ME) w/Device KIT Use as instructed to check blood sugar three times daily. E11.69 1 kit 0   Blood Glucose Monitoring Suppl (ONETOUCH VERIO) w/Device KIT Use to check blood sugar TID. E11.69 1 kit 0   colchicine 0.6 MG tablet Take 0.5 tablets (0.3 mg total) by mouth daily. 90 tablet 0   DULoxetine (CYMBALTA) 60 MG capsule Take 1 capsule (60 mg total) by mouth daily. 90 capsule 3   EPINEPHrine 0.3 mg/0.3 mL IJ SOAJ injection Inject 0.3 mg into the muscle as needed for anaphylaxis. 1 each 1   glucose blood (ONETOUCH VERIO) test strip Use to check blood sugar TID. E11.69 100 each 6   insulin aspart (NOVOLOG) 100 UNIT/ML injection Inject 12 Units into the skin 3 (three) times daily before meals. For blood sugars 0-199 give 0 units of insulin, 201-250 give 4 units, 251-300 give 6 units, 301-350 give 8 units, 351-400 give 10 units,> 400 give 12 units and call M.D. 10 mL 3   LEVEMIR 100 UNIT/ML  injection INJECT 0.4 MLS (40 UNITS TOTAL) INTO THE SKIN 2 (TWO) TIMES DAILY. 60 mL 1   metoprolol succinate (TOPROL-XL) 25 MG 24 hr tablet Take 1 tablet (25 mg total) by mouth daily. 30 tablet 0   Multiple Vitamins-Minerals (MULTIVITAMIN WOMEN PO) Take 1 tablet by mouth daily.     nitroGLYCERIN (NITROSTAT) 0.4 MG SL tablet Place 1 tablet (0.4 mg total) under the tongue every 5 (five) minutes x 3 doses as needed for chest pain. 25 tablet 12   OneTouch Delica Lancets 86P MISC Use to check blood sugar TID.  E11.69 100 each 6   pantoprazole (PROTONIX) 40 MG tablet Take 40 mg by mouth 2 (two) times daily.     Potassium Chloride ER 20 MEQ TBCR Take 20 mEq by mouth 2 (two) times daily. 60 tablet 6   RESTASIS 0.05 % ophthalmic emulsion Place 1 drop into both eyes 2 (two) times daily.  12   rosuvastatin (CRESTOR) 10 MG tablet Take 1 tablet (10 mg total) by mouth daily. 90 tablet 1   traMADol (ULTRAM) 50 MG tablet Take 1 tablet (50 mg total) by mouth every 8 (eight) hours as needed. 20 tablet 0   TRULICITY 1.5 YP/9.5KD SOPN INJECT 1.5 MG INTO THE SKIN ONCE A WEEK. 0.5 mL 1   No current facility-administered medications for this encounter.   Allergies  Allergen Reactions   Sulfa Antibiotics Itching   Social History   Socioeconomic History   Marital status: Significant Other    Spouse name: Not on file   Number of children: 2   Years of education: Not on file   Highest education level: Some college, no degree  Occupational History   Not on file  Tobacco Use   Smoking status: Never   Smokeless tobacco: Never  Vaping Use   Vaping Use: Never used  Substance and Sexual Activity   Alcohol use: No   Drug use: No   Sexual activity: Not Currently  Other Topics Concern   Not on file  Social History Narrative   Pt lives in [place] with spouse.  2 grown children.   Previously worked in [employer] at [employer].  Now on disability   Social Determinants of Health   Financial Resource Strain: Medium  Risk   Difficulty of Paying Living Expenses: Somewhat hard  Food Insecurity: No Food Insecurity   Worried About Charity fundraiser in the Last Year: Never true   Ran Out of Food in the Last Year: Never true  Transportation Needs: Unmet Transportation Needs   Lack of Transportation (Medical): Yes   Lack of Transportation (Non-Medical): No  Physical Activity: Not on file  Stress: Not on file  Social Connections: Not on file  Intimate Partner Violence: Not on file   Family History  Problem Relation Age of Onset   Cancer Mother    Hypertension Mother    Cancer Father    CAD Other    Hypertension Sister    Hypertension Brother    Wt 123.9 kg (273 lb 3.2 oz)   BMI 42.79 kg/m   Wt Readings from Last 3 Encounters:  02/25/21 123.9 kg (273 lb 3.2 oz)  02/23/21 124.7 kg (275 lb)  02/22/21 122.7 kg (270 lb 6.4 oz)   PHYSICAL EXAM: General:  NAD. No resp difficulty HEENT: Normal Neck: Supple. JVP to jaw. Carotids 2+ bilat; no bruits. No lymphadenopathy or thryomegaly appreciated. Cor: PMI nondisplaced. Irregular rate & rhythm. No rubs, gallops or murmurs. Lungs: Clear, faint crackles RLL Abdomen: Obese, +tender, nondistended. No hepatosplenomegaly. No bruits or masses. Good bowel sounds. Extremities: No cyanosis, clubbing, rash, 1-2 +LE edema however she appears to have baseline lymphedema. Neuro: Alert & oriented x 3, cranial nerves grossly intact. Moves all 4 extremities w/o difficulty. Affect pleasant.  ECG: SVT?atrial fibrillation with RVR 130's (personally reviewed).  ReDs: 46%  ASSESSMENT & PLAN:  1. Valvular heart disease s/p MVR/TVR  on 5/12 - Severe TR s/p annuloplasty. Severe MR s/p annuloplasty on 12/30/20 - Echo 01/02/21 LVEF 55% RV moderately HK. Moderate TR. No significant MR. No effusion. - Sternotomy site well healed.    2. Acute Systolic HF with R>>L HF - TEE 4/22 EF 45-50% RV moderately HK  - Echo LVEF 55% RV moderately HK. Moderate TR. No significant MR. No  effusion. - NYHA Class IIIb. Volume overloaded, ReDs Clip 46%. Weight only down 2 lbs from increasing torsemide to 80 mg bid.  - She needs to be evaluated in ED for IV diuresis. - No Spiro w/ CKD. - No SGLT2i for now with recent GFR 19.   3. Longstanding Persistent Atrial Fibrillation s/p Maze 5/15 - Persistent AF, now with RVR in 130s on EKG today. - Amio increased at last visit to 200 bid, however she says she has not taken for several days, although she has been on for several months. - Off digoxin with recent AKI and EF 55%.  - On Eliquis 5 mg bid. No bleeding issues. - She has been referred to EP for AV nodal ablation/PPM.   4. Stage IIIb CKD  - Recent AKIs on last 2 admissions. Followed by nephrology. - Baseline SCr 1.6-1.8. - Labs today.  5. DM2 - Management per PCP.  - Recent GFR too low for SGLT2i.  - A1c (6/22) 7.4  6. Obesity/ Deconditioning Body mass index is 42.79 kg/m. - She has been referred to CR.  Her HR is now in 130s SVT vs AF, still SOB, worsening dizziness, failed increased diuretic. I worry she is at risk for quick decompensation. On further discussion, she says she has been out of her amiodarone for several days, although she has been on amio for several weeks. Rapid response RN will assist with transfer to ED. ED charge nurse notified patient is on the way.  Will likely need IV diuresis  and stabilization of [place] 02/25/21

## 2021-02-26 LAB — BASIC METABOLIC PANEL
Anion gap: 7 (ref 5–15)
BUN: 25 mg/dL — ABNORMAL HIGH (ref 8–23)
CO2: 32 mmol/L (ref 22–32)
Calcium: 8.9 mg/dL (ref 8.9–10.3)
Chloride: 98 mmol/L (ref 98–111)
Creatinine, Ser: 2.04 mg/dL — ABNORMAL HIGH (ref 0.44–1.00)
GFR, Estimated: 27 mL/min — ABNORMAL LOW (ref >60)
Glucose, Bld: 206 mg/dL — ABNORMAL HIGH (ref 70–99)
Potassium: 3.6 mmol/L (ref 3.5–5.1)
Sodium: 137 mmol/L (ref 135–145)

## 2021-02-26 LAB — GLUCOSE, CAPILLARY
Glucose-Capillary: 243 mg/dL — ABNORMAL HIGH (ref 70–99)
Glucose-Capillary: 208 mg/dL — ABNORMAL HIGH (ref 70–99)
Glucose-Capillary: 212 mg/dL — ABNORMAL HIGH (ref 70–99)
Glucose-Capillary: 221 mg/dL — ABNORMAL HIGH (ref 70–99)

## 2021-02-26 MED ORDER — METOCLOPRAMIDE HCL 5 MG/ML IJ SOLN
5.0000 mg | Freq: Three times a day (TID) | INTRAMUSCULAR | Status: DC | PRN
  Administered 2021-02-26: 5 mg via INTRAVENOUS
  Filled 2021-02-26: qty 2

## 2021-02-26 MED ORDER — NITROGLYCERIN 0.4 MG SL SUBL
SUBLINGUAL_TABLET | SUBLINGUAL | Status: AC
  Filled 2021-02-26: qty 1

## 2021-02-26 MED ORDER — METOLAZONE 2.5 MG PO TABS
2.5000 mg | ORAL_TABLET | Freq: Once | ORAL | Status: AC
  Administered 2021-02-26: 2.5 mg via ORAL
  Filled 2021-02-26: qty 1

## 2021-02-26 MED ORDER — NITROGLYCERIN 0.4 MG SL SUBL
0.4000 mg | SUBLINGUAL_TABLET | SUBLINGUAL | Status: AC | PRN
  Administered 2021-02-26 (×3): 0.4 mg via SUBLINGUAL

## 2021-02-26 MED ORDER — POTASSIUM CHLORIDE CRYS ER 20 MEQ PO TBCR
40.0000 meq | EXTENDED_RELEASE_TABLET | Freq: Once | ORAL | Status: AC
  Administered 2021-02-26: 40 meq via ORAL
  Filled 2021-02-26: qty 2

## 2021-02-26 NOTE — Progress Notes (Signed)
   02/26/21 0055 02/26/21 0100  Vitals  BP  --  103/63  MAP (mmHg)  --  72  Pulse Rate (!) 121 (!) 118  ECG Heart Rate (!) 120 (!) 113  Resp (!) 24 (!) 25  MEWS COLOR  MEWS Score Color Yellow Yellow  Oxygen Therapy  SpO2  --  99 %  MEWS Score  MEWS Temp 0 0  MEWS Systolic 0 0  MEWS Pulse 2 2  MEWS RR 1 1  MEWS LOC 0 0  MEWS Score 3 3   Pt c/o pain in chest, left side of face and then down left arm into hand. Rapid Response on unit.  She was asked to assist with Pt.

## 2021-02-26 NOTE — Progress Notes (Signed)
   02/26/21 0400  Assess: MEWS Score  BP 115/75  Pulse Rate (!) 131  ECG Heart Rate (!) 130  Resp 20  Assess: MEWS Score  MEWS Temp 0  MEWS Systolic 0  MEWS Pulse 3  MEWS RR 0  MEWS LOC 0  MEWS Score 3  MEWS Score Color Yellow  Assess: if the MEWS score is Yellow or Red  Were vital signs taken at a resting state? Yes  Focused Assessment No change from prior assessment  Early Detection of Sepsis Score *See Row Information* Low  MEWS guidelines implemented *See Row Information* No, previously yellow, continue vital signs every 4 hours  Notify: Provider  Provider response No new orders (continue to monitor the Pt.)  Date of Provider Response 02/26/21  Time of Provider Response 0500  No changed to continue to monitor the Pt.

## 2021-02-26 NOTE — Significant Event (Signed)
Rapid Response Event Note   Reason for Call :  Chest pain  Initial Focused Assessment:  Pt lying in bed with eyes open, alert and oriented. She is c/o 8/10  shooting chest pain radiating to her L jaw/L arm accompanied by nausea. She says she has this pain at home sometimes. Lungs clear with crackles in bases. Skin warm and dry.   T-97.9, HR-118, BP-103/63, RR-25  Interventions:  EKG-Afib c RVR Zofran 4 mg IV(previously ordered prn med) NTG SL x 3 Plan of Care:  After 2 SL NTG, pt says her CP is gone but is still c/o L arm 5/10 pain. She also says her nausea is getting better. She is in no distress. Alert MD of happenings. Continue to monitor pt closely. Call RRT if further assistance needed.    Event Summary:   MD Notified: Dr. Rudi Rummage paged by bedside RN.  Call Dixie  Dillard Essex, RN

## 2021-02-26 NOTE — Progress Notes (Signed)
   02/26/21 0715  Assess: MEWS Score  Temp 98.7 F (37.1 C)  BP (!) 133/101  Pulse Rate (!) 136  ECG Heart Rate (!) 137  Resp (!) 25  SpO2 99 %  Assess: MEWS Score  MEWS Temp 0  MEWS Systolic 0  MEWS Pulse 3  MEWS RR 1  MEWS LOC 0  MEWS Score 4  MEWS Score Color Red  Assess: if the MEWS score is Yellow or Red  Were vital signs taken at a resting state? Yes  Focused Assessment No change from prior assessment  Early Detection of Sepsis Score *See Row Information* Low  MEWS guidelines implemented *See Row Information* No, vital signs rechecked  Treat  Pain Scale 0-10  Pain Score 0  Take Vital Signs  Increase Vital Sign Frequency  Red: Q 1hr X 4 then Q 4hr X 4, if remains red, continue Q 4hrs  Escalate  MEWS: Escalate Red: discuss with charge nurse/RN and provider, consider discussing with RRT  Notify: Charge Nurse/RN  Name of Charge Nurse/RN Notified april  Date Charge Nurse/RN Notified 02/26/21  Time Charge Nurse/RN Notified 0720  Notify: Provider  Provider Name/Title [name]  Date Provider Notified 02/26/21  Time Provider Notified 0730  Notification Type Call  Notification Reason  (nauseous)  Provider response No new orders  Date of Provider Response 02/26/21  Time of Provider Response [phone]  Document  Patient Outcome Not stable and remains on department

## 2021-02-26 NOTE — Progress Notes (Signed)
Heart rate still on 130's A- fib./ A-flutter Amiodarone gtt increased to 60 mg  as ordered. Continue to monitor.

## 2021-02-26 NOTE — Progress Notes (Signed)
Tolerated all am po meds . No further vomiting noted.continue to monitor.

## 2021-02-26 NOTE — Plan of Care (Signed)
  Problem: Education: Goal: Knowledge of General Education information will improve Description: Including pain rating scale, medication(s)/side effects and non-pharmacologic comfort measures Outcome: Progressing   Problem: Education: Goal: Knowledge of General Education information will improve Description: Including pain rating scale, medication(s)/side effects and non-pharmacologic comfort measures Outcome: Progressing   Problem: Health Behavior/Discharge Planning: Goal: Ability to manage health-related needs will improve Outcome: Progressing   Problem: Clinical Measurements: Goal: Ability to maintain clinical measurements within normal limits will improve Outcome: Progressing Goal: Will remain free from infection Outcome: Progressing Goal: Diagnostic test results will improve Outcome: Progressing Goal: Respiratory complications will improve Outcome: Progressing Goal: Cardiovascular complication will be avoided Outcome: Progressing   Problem: Activity: Goal: Risk for activity intolerance will decrease Outcome: Progressing   Problem: Nutrition: Goal: Adequate nutrition will be maintained Outcome: Progressing   Problem: Coping: Goal: Level of anxiety will decrease Outcome: Progressing   Problem: Elimination: Goal: Will not experience complications related to bowel motility Outcome: Progressing Goal: Will not experience complications related to urinary retention Outcome: Progressing   Problem: Pain Managment: Goal: General experience of comfort will improve Outcome: Progressing   Problem: Safety: Goal: Ability to remain free from injury will improve Outcome: Progressing   Problem: Skin Integrity: Goal: Risk for impaired skin integrity will decrease Outcome: Progressing   Problem: Education: Goal: Ability to demonstrate management of disease process will improve Outcome: Progressing Goal: Ability to verbalize understanding of medication therapies will  improve Outcome: Progressing Goal: Individualized Educational Video(s) Outcome: Progressing   Problem: Activity: Goal: Capacity to carry out activities will improve Outcome: Progressing   Problem: Cardiac: Goal: Ability to achieve and maintain adequate cardiopulmonary perfusion will improve Outcome: Progressing

## 2021-02-26 NOTE — Progress Notes (Signed)
   02/26/21 0055  Vitals  Pulse Rate (!) 121  ECG Heart Rate (!) 120  Resp (!) 24  MEWS COLOR  MEWS Score Color Yellow  Oxygen Therapy  SpO2 99 %  O2 Device Room Air  Complaints & Interventions  Complains of Nausea /  Vomiting  Nausea relieved by Antiemetic  MEWS Score  MEWS Temp 0  MEWS Systolic 0  MEWS Pulse 2  MEWS RR 1  MEWS LOC 0  MEWS Score 3   Pt called out was having chest pain, headache and nausea

## 2021-02-26 NOTE — Progress Notes (Signed)
Vomited to small amount of undigested food. Got antiemetic med an hour ago. Provided with ice chips to melt in her mouth. Continue to monitor.

## 2021-02-26 NOTE — Progress Notes (Signed)
Patient ID: [name], female   DOB: 21-Dec-1955, 65 y.o.   MRN: [id number]     Advanced Heart Failure Rounding Note  PCP-Cardiologist: Buford Dresser, MD   Subjective:    Nauseated earlier this morning, Zofran helped some but coming back.    HR 130s, atypical atrial flutter.  She is on amiodarone gtt 30 mg/hr.   Not much UOP recorded, creatinine 2.21 => 2.04.   Echo: EF 30-35%, moderate LVH, moderate RV dysfunction, PASP 48, s/p MV repair with mild MR and mean gradient 10 mmHg with HR around 130, s/p TV repair with mild-moderate TR, dilated IVC.    Objective:   Weight Range: 124.8 kg Body mass index is 43.09 kg/m.   Vital Signs:   Temp:  [97.8 F (36.6 C)-98.7 F (37.1 C)] 98.7 F (37.1 C) (07/09 0715) Pulse Rate:  [112-141] 136 (07/09 0715) Resp:  [15-31] 25 (07/09 0715) BP: (90-148)/(58-111) 133/101 (07/09 0715) SpO2:  [95 %-100 %] 99 % (07/09 0715) Weight:  [124.8 kg] 124.8 kg (07/09 0338) Last BM Date: 02/25/21  Weight change: Filed Weights   02/25/21 0858 02/26/21 0338  Weight: 123.8 kg 124.8 kg    Intake/Output:   Intake/Output Summary (Last 24 hours) at 02/26/2021 0936 Last data filed at 02/26/2021 0600 Gross per 24 hour  Intake 427.53 ml  Output 300 ml  Net 127.53 ml      Physical Exam    General: NAD Neck: JVP 14-16 cm, no thyromegaly or thyroid nodule.  Lungs: Clear to auscultation bilaterally with normal respiratory effort. CV: Nondisplaced PMI.  Heart tachy, regular S1/S2, no S3/S4, no murmur.  1+ ankle edema.  Abdomen: Soft, nontender, no hepatosplenomegaly, no distention.  Skin: Intact without lesions or rashes.  Neurologic: Alert and oriented x 3.  Psych: Normal affect. Extremities: No clubbing or cyanosis.  HEENT: Normal.    Telemetry   Atypical atrial flutter rate 130s (personally reviewed)   Labs    CBC Recent Labs    02/25/21 0909  WBC 6.7  NEUTROABS 3.7  HGB 10.1*  HCT 33.3*  MCV 90.7  PLT 850   Basic  Metabolic Panel Recent Labs    02/25/21 0909 02/26/21 0022  NA 139 137  K 3.6 3.6  CL 99 98  CO2 30 32  GLUCOSE 202* 206*  BUN 26* 25*  CREATININE 2.21* 2.04*  CALCIUM 8.9 8.9  MG 1.6*  --    Liver Function Tests No results for input(s): AST, ALT, ALKPHOS, BILITOT, PROT, ALBUMIN in the last 72 hours. No results for input(s): LIPASE, AMYLASE in the last 72 hours. Cardiac Enzymes No results for input(s): CKTOTAL, CKMB, CKMBINDEX, TROPONINI in the last 72 hours.  BNP: BNP (last 3 results) Recent Labs    02/07/21 2105 02/23/21 1011 02/25/21 0909  BNP 74.1 204.3* 162.7*    ProBNP (last 3 results) No results for input(s): PROBNP in the last 8760 hours.   D-Dimer No results for input(s): DDIMER in the last 72 hours. Hemoglobin A1C No results for input(s): HGBA1C in the last 72 hours. Fasting Lipid Panel No results for input(s): CHOL, HDL, LDLCALC, TRIG, CHOLHDL, LDLDIRECT in the last 72 hours. Thyroid Function Tests No results for input(s): TSH, T4TOTAL, T3FREE, THYROIDAB in the last 72 hours.  Invalid input(s): FREET3  Other results:   Imaging    ECHOCARDIOGRAM COMPLETE  Result Date: 02/25/2021    ECHOCARDIOGRAM REPORT   Patient Name:   [name] Date of Exam: 02/25/2021 Medical Rec #:  [id number]  Height:       67.0 in Accession #:    [id number]           Weight:       273.0 lb Date of Birth:  [birth year]            BSA:          2.308 m Patient Age:    39 years             BP:           105/80 mmHg Patient Gender: F                    HR:           126 bpm. Exam Location:  Inpatient Procedure: 2D Echo, Cardiac Doppler and Color Doppler Indications:    Congestive Heart Failure I50.9  History:        Patient has prior history of Echocardiogram examinations. CHF,                 COPD, Arrythmias:Atrial Fibrillation, Signs/Symptoms:Shortness                 of Breath and Chest Pain; Risk Factors:Hypertension, Diabetes                 and Dyslipidemia.  GERD. MITRAL VALVE REPAIR USING CARBOMEDICS                 ANNULOFLEX RING SIZE 30; TRICUSPID VALVE REPAIR WITH EDWARDS MC3                 TRICUSPID RING SIZE 34, 12/29/2020.  Sonographer:    Vickie Epley RDCS Referring Phys: [phone] AMY D CLEGG IMPRESSIONS  1. Left ventricular ejection fraction, by estimation, is 30 to 35%. The left ventricle has moderately decreased function. The left ventricle demonstrates global hypokinesis. There is moderate left ventricular hypertrophy. Left ventricular diastolic parameters are indeterminate.  2. Right ventricular systolic function is moderately reduced. The right ventricular size is mildly enlarged. There is moderately elevated pulmonary artery systolic pressure. The estimated right ventricular systolic pressure is 43.1 mmHg.  3. Left atrial size was moderately dilated.  4. Right atrial size was mildly dilated.  5. The mitral valve has been repaired/replaced. There is a prosthetic annuloplasty ring present in the mitral position. Mild mitral valve regurgitation. Moderate to severe mitral stenosis. Mean gradient 10 mmHg at HR 129 bpm.  6. The tricuspid valve is has been repaired/replaced. The tricuspid valve is status post repair with an annuloplasty ring. Mean gradient 5 mmHg at HR 129 bpm. Tricuspid valve regurgitation is moderate.  7. The aortic valve is tricuspid. Aortic valve regurgitation is not visualized. No aortic stenosis is present.  8. The inferior vena cava is dilated in size with <50% respiratory variability, suggesting right atrial pressure of 15 mmHg. FINDINGS  Left Ventricle: Left ventricular ejection fraction, by estimation, is 30 to 35%. The left ventricle has moderately decreased function. The left ventricle demonstrates global hypokinesis. The left ventricular internal cavity size was normal in size. There is moderate left ventricular hypertrophy. Left ventricular diastolic parameters are indeterminate. Right Ventricle: The right ventricular size is mildly  enlarged. Right vetricular wall thickness was not well visualized. Right ventricular systolic function is moderately reduced. There is moderately elevated pulmonary artery systolic pressure. The tricuspid regurgitant velocity is 2.88 m/s, and with an assumed right atrial pressure of 15 mmHg, the estimated right ventricular systolic pressure is 54.0 mmHg. Left  Atrium: Left atrial size was moderately dilated. Right Atrium: Right atrial size was mildly dilated. Pericardium: There is no evidence of pericardial effusion. Mitral Valve: The mitral valve has been repaired/replaced. Mild mitral valve regurgitation. There is a prosthetic annuloplasty ring present in the mitral position. Moderate to severe mitral valve stenosis. MV peak gradient, 13.5 mmHg. The mean mitral valve gradient is 9.0 mmHg. Tricuspid Valve: The tricuspid valve is has been repaired/replaced. Tricuspid valve regurgitation is moderate. The tricuspid valve is status post repair with an annuloplasty ring. Aortic Valve: The aortic valve is tricuspid. Aortic valve regurgitation is not visualized. No aortic stenosis is present. Pulmonic Valve: The pulmonic valve was not well visualized. Pulmonic valve regurgitation is trivial. Aorta: The aortic root is normal in size and structure. Venous: The inferior vena cava is dilated in size with less than 50% respiratory variability, suggesting right atrial pressure of 15 mmHg. IAS/Shunts: The interatrial septum was not well visualized.  LEFT VENTRICLE PLAX 2D LVOT diam:     1.50 cm LV SV:         22 LV SV Index:   9 LVOT Area:     1.77 cm  LV Volumes (MOD) LV vol d, MOD A2C: 117.0 ml LV vol d, MOD A4C: 139.0 ml LV vol s, MOD A2C: 83.2 ml LV vol s, MOD A4C: 89.4 ml LV SV MOD A2C:     33.8 ml LV SV MOD A4C:     139.0 ml LV SV MOD BP:      43.8 ml RIGHT VENTRICLE TAPSE (M-mode): 0.9 cm LEFT ATRIUM              Index       RIGHT ATRIUM           Index LA Vol (A2C):   79.6 ml  34.49 ml/m RA Area:     25.90 cm LA Vol  (A4C):   108.0 ml 46.79 ml/m RA Volume:   81.80 ml  35.44 ml/m LA Biplane Vol: 101.0 ml 43.76 ml/m  AORTIC VALVE LVOT Vmax:   87.90 cm/s LVOT Vmean:  57.000 cm/s LVOT VTI:    0.122 m  AORTA Ao Root diam: 2.90 cm MITRAL VALVE              TRICUSPID VALVE MV Area VTI:  0.90 cm    TR Peak grad:   33.2 mmHg MV Peak grad: 13.5 mmHg   TR Vmax:        288.00 cm/s MV Mean grad: 9.0 mmHg MV Vmax:      1.84 m/s    SHUNTS MV Vmean:     139.0 cm/s  Systemic VTI:  0.12 m MR Peak grad: 69.9 mmHg   Systemic Diam: 1.50 cm MR Mean grad: 46.0 mmHg MR Vmax:      418.00 cm/s MR Vmean:     331.0 cm/s Oswaldo Milian MD Electronically signed by Oswaldo Milian MD Signature Date/Time: 02/25/2021/5:09:12 PM    Final      Medications:     Scheduled Medications:  apixaban  5 mg Oral BID   docusate sodium  100 mg Oral BID   DULoxetine  60 mg Oral Daily   furosemide  80 mg Intravenous BID   insulin aspart  0-20 Units Subcutaneous TID WC   metolazone  2.5 mg Oral Once   nitroGLYCERIN       pantoprazole  40 mg Oral BID   potassium chloride  40 mEq Oral Once   rosuvastatin  10  mg Oral Daily   sodium chloride flush  3 mL Intravenous Q12H    Infusions:  sodium chloride     amiodarone 30 mg/hr (02/26/21 0800)    PRN Medications: sodium chloride, acetaminophen, metoCLOPramide (REGLAN) injection, ondansetron (ZOFRAN) IV, sodium chloride flush   Assessment/Plan   1. Atrial flutter: Patient is in atypical atrial flutter with RVR.  She was out of amiodarone for about a week.  She had Maze in 5/22 and DCCV in 6/22. - Increase amiodarone gtt to 60 mg/hr today.  - Continue Eliquis, has not missed doses. - Have arranged for DCCV on Monday after diuresis. 2. Acute systolic CHF: Prior echo with EF 55%.  Echo this admission in setting of AFL with RVR showed EF 30-35%, moderate LVH, moderate RV dysfunction, PASP 48, s/p MV repair with mild MR and mean gradient 10 mmHg with HR around 130, s/p TV repair with  mild-moderate TR, dilated IVC.  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  On exam, she remains volume overloaded with NYHA class IV symptoms. Creatinine 2.2 => 2.05 (near baseline).   - Continue Lasix 80 mg IV bid, will give metolazone 2.5 x 1.  Replace K. - Need to get her back into NSR after diuresis (see above). 3. CKD stage 3: Creatinine 2.2 => 2.05, near baseline.  Watch closely with diuresis. 4. S/p MV and TV repairs in 5/22: Echo this admission with mild-moderate TR.  There is only mild MR, but mean gradient across the valve is 10 mmHg suggesting mitral stenosis.  However, HR was also around 130, so would favor repeat limited echo for MV assessment when HR is lower/in NSR.    Length of Stay: 1  Loralie Champagne, MD  02/26/2021, 9:36 AM  Advanced Heart Failure Team Pager [phone] (M-F; 7a - 5p)  Please contact [facility] for night-coverage after hours (5p -7a ) and weekends on [email or web address]

## 2021-02-26 NOTE — Plan of Care (Signed)

## 2021-02-27 DIAGNOSIS — I480 Paroxysmal atrial fibrillation: Secondary | ICD-10-CM

## 2021-02-27 LAB — CBC
HCT: 31.2 % — ABNORMAL LOW (ref 36.0–46.0)
Hemoglobin: 10 g/dL — ABNORMAL LOW (ref 12.0–15.0)
MCH: 27.6 pg (ref 26.0–34.0)
MCHC: 32.1 g/dL (ref 30.0–36.0)
MCV: 86.2 fL (ref 80.0–100.0)
Platelets: 128 K/uL — ABNORMAL LOW (ref 150–400)
RBC: 3.62 MIL/uL — ABNORMAL LOW (ref 3.87–5.11)
RDW: 16 % — ABNORMAL HIGH (ref 11.5–15.5)
WBC: 9.4 K/uL (ref 4.0–10.5)
nRBC: 0 % (ref 0.0–0.2)

## 2021-02-27 LAB — BASIC METABOLIC PANEL WITH GFR
Anion gap: 10 (ref 5–15)
BUN: 25 mg/dL — ABNORMAL HIGH (ref 8–23)
CO2: 30 mmol/L (ref 22–32)
Calcium: 9.2 mg/dL (ref 8.9–10.3)
Chloride: 95 mmol/L — ABNORMAL LOW (ref 98–111)
Creatinine, Ser: 2.35 mg/dL — ABNORMAL HIGH (ref 0.44–1.00)
GFR, Estimated: 23 mL/min — ABNORMAL LOW
Glucose, Bld: 219 mg/dL — ABNORMAL HIGH (ref 70–99)
Potassium: 3.4 mmol/L — ABNORMAL LOW (ref 3.5–5.1)
Sodium: 135 mmol/L (ref 135–145)

## 2021-02-27 LAB — GLUCOSE, CAPILLARY
Glucose-Capillary: 216 mg/dL — ABNORMAL HIGH (ref 70–99)
Glucose-Capillary: 234 mg/dL — ABNORMAL HIGH (ref 70–99)
Glucose-Capillary: 260 mg/dL — ABNORMAL HIGH (ref 70–99)
Glucose-Capillary: 161 mg/dL — ABNORMAL HIGH (ref 70–99)

## 2021-02-27 LAB — MAGNESIUM: Magnesium: 1.7 mg/dL (ref 1.7–2.4)

## 2021-02-27 MED ORDER — POTASSIUM CHLORIDE CRYS ER 20 MEQ PO TBCR
40.0000 meq | EXTENDED_RELEASE_TABLET | Freq: Once | ORAL | Status: AC
  Administered 2021-02-27: 40 meq via ORAL
  Filled 2021-02-27: qty 2

## 2021-02-27 MED ORDER — MAGNESIUM SULFATE 2 GM/50ML IV SOLN
2.0000 g | Freq: Once | INTRAVENOUS | Status: AC
  Administered 2021-02-27: 2 g via INTRAVENOUS
  Filled 2021-02-27: qty 50

## 2021-02-27 NOTE — Progress Notes (Signed)
Pt's consent taken for DCCV procedure tomorrow, pt aware of NPO from midnight, husband is at bed side and updated, Pt's HR running 80-90's. Will continue to monitor  Palma Holter, RN

## 2021-02-27 NOTE — Plan of Care (Signed)

## 2021-02-27 NOTE — Progress Notes (Signed)
Patient ID: [name], female   DOB: 1956/06/29, 65 y.o.   MRN: [id number]     Advanced Heart Failure Rounding Note  PCP-Cardiologist: Buford Dresser, MD   Subjective:    Converted to NSR on IV amio last night. Remains on amio at 32. Feels much better. Denies SOB, orthopnea or PND.  Diuresed well with IV lasix. Weight down 4 pounds. Scr 2.04 -> 2.35   Echo: EF 30-35%, moderate LVH, moderate RV dysfunction, PASP 48, s/p MV repair with mild MR and mean gradient 10 mmHg with HR around 130, s/p TV repair with mild-moderate TR, dilated IVC.    Objective:   Weight Range: 123 kg Body mass index is 42.47 kg/m.   Vital Signs:   Temp:  [97.6 F (36.4 C)-98.7 F (37.1 C)] 98.7 F (37.1 C) (07/10 1142) Pulse Rate:  [85-92] 85 (07/10 1142) Resp:  [18-25] 18 (07/10 1142) BP: (113-131)/(70-102) 126/85 (07/10 1142) SpO2:  [98 %-100 %] 100 % (07/10 1142) Weight:  [123 kg] 123 kg (07/10 0409) Last BM Date: 02/25/21  Weight change: Filed Weights   02/25/21 0858 02/26/21 0338 02/27/21 0409  Weight: 123.8 kg 124.8 kg 123 kg    Intake/Output:   Intake/Output Summary (Last 24 hours) at 02/27/2021 1444 Last data filed at 02/27/2021 0800 Gross per 24 hour  Intake 897.07 ml  Output 600 ml  Net 297.07 ml       Physical Exam    General:  Sitting up in bed No resp difficulty HEENT: normal Neck: supple. JVP hard to see Carotids 2+ bilat; no bruits. No lymphadenopathy or thryomegaly appreciated. Cor: PMI nondisplaced. Regular rate & rhythm. No rubs, gallops or murmurs. Lungs: clear Abdomen: obese soft, nontender, nondistended. No hepatosplenomegaly. No bruits or masses. Good bowel sounds. Extremities: no cyanosis, clubbing, rash, tr edema Neuro: alert & orientedx3, cranial nerves grossly intact. moves all 4 extremities w/o difficulty. Affect pleasant    Telemetry   Sinus 80s Personally reviewed   Labs    CBC Recent Labs    02/25/21 0909 02/27/21 0541  WBC  6.7 9.4  NEUTROABS 3.7  --   HGB 10.1* 10.0*  HCT 33.3* 31.2*  MCV 90.7 86.2  PLT 159 128*    Basic Metabolic Panel Recent Labs    02/25/21 0909 02/26/21 0022 02/27/21 0541  NA 139 137 135  K 3.6 3.6 3.4*  CL 99 98 95*  CO2 30 32 30  GLUCOSE 202* 206* 219*  BUN 26* 25* 25*  CREATININE 2.21* 2.04* 2.35*  CALCIUM 8.9 8.9 9.2  MG 1.6*  --  1.7    Liver Function Tests No results for input(s): AST, ALT, ALKPHOS, BILITOT, PROT, ALBUMIN in the last 72 hours. No results for input(s): LIPASE, AMYLASE in the last 72 hours. Cardiac Enzymes No results for input(s): CKTOTAL, CKMB, CKMBINDEX, TROPONINI in the last 72 hours.  BNP: BNP (last 3 results) Recent Labs    02/07/21 2105 02/23/21 1011 02/25/21 0909  BNP 74.1 204.3* 162.7*     ProBNP (last 3 results) No results for input(s): PROBNP in the last 8760 hours.   D-Dimer No results for input(s): DDIMER in the last 72 hours. Hemoglobin A1C No results for input(s): HGBA1C in the last 72 hours. Fasting Lipid Panel No results for input(s): CHOL, HDL, LDLCALC, TRIG, CHOLHDL, LDLDIRECT in the last 72 hours. Thyroid Function Tests No results for input(s): TSH, T4TOTAL, T3FREE, THYROIDAB in the last 72 hours.  Invalid input(s): FREET3  Other results:   Imaging  No results found.   Medications:     Scheduled Medications:  apixaban  5 mg Oral BID   docusate sodium  100 mg Oral BID   DULoxetine  60 mg Oral Daily   insulin aspart  0-20 Units Subcutaneous TID WC   pantoprazole  40 mg Oral BID   rosuvastatin  10 mg Oral Daily   sodium chloride flush  3 mL Intravenous Q12H    Infusions:  sodium chloride     amiodarone 60 mg/hr (02/27/21 1321)    PRN Medications: sodium chloride, acetaminophen, metoCLOPramide (REGLAN) injection, ondansetron (ZOFRAN) IV, sodium chloride flush   Assessment/Plan   1. Atrial flutter: Patient is in atypical atrial flutter with RVR.  She was out of amiodarone for about a week.   She had Maze in 5/22 and DCCV in 6/22. - Back in NSR on IV amio. Will continue IV amio overnight.  - Continue Eliquis, has not missed doses. 2. Acute systolic CHF: Prior echo with EF 55%.  Echo this admission in setting of AFL with RVR showed EF 30-35%, moderate LVH, moderate RV dysfunction, PASP 48, s/p MV repair with mild MR and mean gradient 10 mmHg with HR around 130, s/p TV repair with mild-moderate TR, dilated IVC.  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   - Volume status improved. SCr climbing a bit. Will hold IV lasix tonight. Resume po diuretics in am  - Supp K - with new reduction in EF need to focus on keeping her in NSR and titrating GDMT as BP and renal function tolerates - consider addition of SGLT2i +/1 spiro tomorrow if renal function stable.  3. CKD stage 3b: Creatinine 2.2 => 2.05 -> 2.35, near baseline.  hold lasix  4. Valvular heart disease - S/p MV and TV repairs in 5/22: Echo this admission with mild-moderate TR.  There is only mild MR, but mean gradient across the valve is 10 mmHg suggesting mitral stenosis.  However, HR was also around 130, so would favor repeat limited echo for MV assessment when HR is lower/in NSR. Can do limited echo in am if still in NSR 5. Hypokalemia/hypomagnesemia - supp 6. DM2 - last HGBA1c 7.4 in 6/22 - consider SGLT2i in am if renal function stable   Length of Stay: 2  Glori Bickers, MD  02/27/2021, 2:44 PM  Advanced Heart Failure Team Pager [phone] (M-F; 7a - 5p)  Please contact [facility] for night-coverage after hours (5p -7a ) and weekends on [email or web address]

## 2021-02-28 ENCOUNTER — Inpatient Hospital Stay (HOSPITAL_COMMUNITY): Payer: Medicare Other

## 2021-02-28 ENCOUNTER — Other Ambulatory Visit (HOSPITAL_COMMUNITY): Payer: Self-pay

## 2021-02-28 DIAGNOSIS — I5023 Acute on chronic systolic (congestive) heart failure: Secondary | ICD-10-CM

## 2021-02-28 LAB — BASIC METABOLIC PANEL
Anion gap: 11 (ref 5–15)
BUN: 25 mg/dL — ABNORMAL HIGH (ref 8–23)
CO2: 31 mmol/L (ref 22–32)
Calcium: 9 mg/dL (ref 8.9–10.3)
Chloride: 95 mmol/L — ABNORMAL LOW (ref 98–111)
Creatinine, Ser: 2.46 mg/dL — ABNORMAL HIGH (ref 0.44–1.00)
GFR, Estimated: 21 mL/min — ABNORMAL LOW (ref >60)
Glucose, Bld: 206 mg/dL — ABNORMAL HIGH (ref 70–99)
Potassium: 3.3 mmol/L — ABNORMAL LOW (ref 3.5–5.1)
Sodium: 137 mmol/L (ref 135–145)

## 2021-02-28 LAB — CBC
HCT: 28.1 % — ABNORMAL LOW (ref 36.0–46.0)
Hemoglobin: 9 g/dL — ABNORMAL LOW (ref 12.0–15.0)
MCH: 27.8 pg (ref 26.0–34.0)
MCHC: 32 g/dL (ref 30.0–36.0)
MCV: 86.7 fL (ref 80.0–100.0)
Platelets: 123 10*3/uL — ABNORMAL LOW (ref 150–400)
RBC: 3.24 MIL/uL — ABNORMAL LOW (ref 3.87–5.11)
RDW: 15.9 % — ABNORMAL HIGH (ref 11.5–15.5)
WBC: 7.9 10*3/uL (ref 4.0–10.5)
nRBC: 0 % (ref 0.0–0.2)

## 2021-02-28 LAB — ECHOCARDIOGRAM LIMITED
Calc EF: 49.5 %
Height: 67 in
S' Lateral: 4 cm
Single Plane A2C EF: 49.4 %
Single Plane A4C EF: 48.9 %
Weight: 4345.71 oz

## 2021-02-28 LAB — PROTIME-INR
INR: 2.3 — ABNORMAL HIGH (ref 0.8–1.2)
Prothrombin Time: 25.3 seconds — ABNORMAL HIGH (ref 11.4–15.2)

## 2021-02-28 LAB — MAGNESIUM: Magnesium: 1.9 mg/dL (ref 1.7–2.4)

## 2021-02-28 LAB — GLUCOSE, CAPILLARY: Glucose-Capillary: 197 mg/dL — ABNORMAL HIGH (ref 70–99)

## 2021-02-28 SURGERY — CARDIOVERSION
Anesthesia: General

## 2021-02-28 MED ORDER — TORSEMIDE 20 MG PO TABS
80.0000 mg | ORAL_TABLET | Freq: Every day | ORAL | Status: DC
  Administered 2021-02-28: 80 mg via ORAL
  Filled 2021-02-28: qty 4

## 2021-02-28 MED ORDER — POTASSIUM CHLORIDE CRYS ER 20 MEQ PO TBCR
40.0000 meq | EXTENDED_RELEASE_TABLET | Freq: Every day | ORAL | 6 refills | Status: DC
  Filled 2021-02-28: qty 60, 30d supply, fill #0

## 2021-02-28 MED ORDER — AMIODARONE HCL 200 MG PO TABS
200.0000 mg | ORAL_TABLET | Freq: Two times a day (BID) | ORAL | 3 refills | Status: DC
  Filled 2021-02-28: qty 60, 30d supply, fill #0

## 2021-02-28 MED ORDER — POTASSIUM CHLORIDE CRYS ER 20 MEQ PO TBCR
40.0000 meq | EXTENDED_RELEASE_TABLET | Freq: Once | ORAL | Status: AC
  Administered 2021-02-28: 40 meq via ORAL
  Filled 2021-02-28: qty 2

## 2021-02-28 MED ORDER — MAGNESIUM SULFATE 2 GM/50ML IV SOLN
2.0000 g | Freq: Once | INTRAVENOUS | Status: DC
  Filled 2021-02-28: qty 50

## 2021-02-28 MED ORDER — AMIODARONE HCL 200 MG PO TABS
200.0000 mg | ORAL_TABLET | Freq: Two times a day (BID) | ORAL | Status: DC
  Administered 2021-02-28: 200 mg via ORAL
  Filled 2021-02-28: qty 1

## 2021-02-28 NOTE — Progress Notes (Signed)
  Echocardiogram 2D Echocardiogram has been performed.  [name] 02/28/2021, 10:15 AM

## 2021-02-28 NOTE — Discharge Summary (Addendum)
Advanced Heart Failure Team  Discharge Summary   Patient ID: [name] MRN: [id number], DOB/AGE: 1956/03/08 65 y.o. Admit date: 02/25/2021 D/C date:     02/28/2021   Primary Discharge Diagnoses:  A Flutter Acute Systolic Heart Failure  CKD Stage IIIb Valvular Heart Disease Hypokalemia/Hypomagnesmia  DMII    Hospital Course:  [name] is a 65 year old with h/o obesity, PAF, has had multiple cardioversions, severe MR/TR, DMII, non obstructive CAD, and chronic diastolic /systolic heart failure.   Had LHC/RHC  12/2020 with nonobstructive cad and elevated filling pressures + low cardiac index.    On 12/30/20 underwent MV/TV repair with MAZE. Post operative course complicated by AKI and volume overload. AHF team was consulted to assist w/ further management. Post operative Echo showed LVEF 55% RV moderately HK. Moderate TR. No significant MR. No effusion. Hemodynamics improved w/ milrinone. Suspected of having post-op ATN. Nephrology assisted w/ transition back to PO diuretics. She was discharged on 5/28. D/w wt was 281 lb. SCr was 2.8. Nephrology recommended home on torsemide 60 mg daily. She was also in Afib day of d/c. Recommendations were to consider DC-CV in several weeks after atrial irritation settles down from Maze.   She was seen in ED 01/29/21 with SOB and CP, with decreased urinary output. She was admitted for A/CHF, diuresed with IV lasix, RHC showed low filling pressures and moderately reduced CO. She underwent successful DCCV 02/01/21. 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 She was discharged with CR referral and instructions to follow up with nephrology.   Seen in ED 02/07/21 for leg swelling & CP. She was back in Afib, rate controlled. She was loaded w amiodarone gtt, and had repeat DCCV on 02/09/21 with conversion to NSR. 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  She returned to clinic 02/22/21 and was back in A fib so amio was increased to 200 twice a day and torsemide was increased to 80 mg twice a day. She returned to the HF clinic on 02/25/21 and was in A flutter RVR with rate in the 130s and dyspneic with minimal exertion. During this visit she reported being out of potassium and amiodarone for 7 days. She was sent the the ED.   Admitted 02/25/21 from the ED with A flutter RVR and Acute Systolic Heart Failure. ECHO showed EF had dropped to 30-35%. Loaded on IV amio and diuresed with IV lasix. Once diuresed IV lasix stopped and started back torsemide 80 mg daily. Mag and Potassium replaced as needed. Chemically converted with IV amio and was transitioned to amio 200 mg twice a day. Continued on eliquis 5 mg twice a day.   With restoration of NSR, limited echo was completed and showed EF was trending up to 40-45%. EP consulted for recurrent A flutter. She in not currently a candidate for PPM AV node ablation given recent MAZE.  Per Dr Quentin Ore would repeat ECHO in 6 months to come up with definitive plan. EP follow up has been set up.   At d/c she was not placed on home Toprol XL due to long PR interval. Can consider SGLT2i / spiro in the outpatient setting. Referred TOC for resumption of home health. [place] delivered potassium and amiodarone to her room for discharge.   See below for detailed problems list. She will contine to be followed closely in  the HF clinic. We will check EKG, BMET, CBC next week.   1. Atrial flutter: Patient is in atypical atrial flutter with RVR.  She was out of amiodarone for about a week.  She had Maze in 5/22 and DCCV in 6/22. -Chemically converted to SR on IV amio. Stop IV amio. Start amio 200 mg twice a day. - Continue eliquis 5 mg twice a day.  - EP consulted. Plan to repeat ECHO in 6 months for definitive plan. Would favor coming off amio down the road given potential  side effects.  2. Acute systolic CHF: Prior echo with EF 55%.  Echo this admission in setting of AFL with RVR showed EF 30-35%, moderate LVH, moderate RV dysfunction, PASP 48, s/p MV repair with mild MR and mean gradient 10 mmHg with HR around 130, s/p TV repair with mild-moderate TR, dilated IVC.  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   - Diuresed with IV lasix and transitioned to torsemide 80 mg daily.  - With new reduction in EF need to focus on keeping her in NSR and titrating GDMT as BP and renal function tolerates - consider addition of SGLT2i + spiro at her follow up. Creatinine 2.5 at discharge. . - Limited ECHO completed once back in NSR and EF had gone up 40-45%.  3. CKD stage 3b: Creatinine 2.2 => 2.05 -> 2.35-->2.5. [place] Kitchen 4. Valvular heart disease - S/p MV and TV repairs in 5/22: Echo this admission with mild-moderate TR.  There is only mild MR, but mean gradient across the valve is 10 mmHg suggesting mitral stenosis.  However, HR was also around 130, so would favor repeat limited echo for MV assessment when HR is lower/in NSR. - Will limited ECHO today.  5. Hypokalemia/hypomagnesemia - K and Mag supplemented as needed.  - Give 2 grams mag.  6. DM2 - last HGBA1c 7.4 in 6/22 - consider SGLT2i at follow up visit.   Discharge Vitals: Blood pressure 100/83, pulse 85, temperature 97.8 F (36.6 C), resp. rate (!) 21, height $RemoveBe'5\' 7"'zqtwGoino$  (1.702 m), weight 123.2 kg, SpO2 100 %.  Labs: Lab Results  Component Value Date   WBC 7.9 02/28/2021   HGB 9.0 (L) 02/28/2021   HCT 28.1 (L) 02/28/2021   MCV 86.7 02/28/2021   PLT 123 (L) 02/28/2021    Recent Labs  Lab 02/28/21 0009  NA 137  K 3.3*  CL 95*  CO2 31  BUN 25*  CREATININE 2.46*  CALCIUM 9.0  GLUCOSE 206*   Lab Results  Component Value Date   CHOL 140 06/24/2020   HDL 46 06/24/2020   LDLCALC 75 06/24/2020   TRIG 102 06/24/2020   BNP (last 3 results) Recent Labs    02/07/21 2105 02/23/21 1011 02/25/21 0909  BNP 74.1 204.3*  162.7*    ProBNP (last 3 results) No results for input(s): PROBNP in the last 8760 hours.   Diagnostic Studies/Procedures   No results found.  Discharge Medications   Allergies as of 02/28/2021       Reactions   Sulfa Antibiotics Itching        Medication List     STOP taking these medications    metoprolol succinate 25 MG 24 hr tablet Commonly known as: TOPROL-XL   Potassium Chloride ER 20 MEQ Tbcr   traMADol 50 MG tablet Commonly known as: Ultram       TAKE these medications    Accu-Chek Guide Me w/Device Kit Use as instructed to check blood sugar three times  daily. E11.69   OneTouch Verio w/Device Kit Use to check blood sugar TID. E11.69   Accu-Chek Softclix Lancets lancets Use as instructed to check blood sugar three times daily. E11.69   OneTouch Delica Lancets 33G Misc Use to check blood sugar TID.  E11.69   albuterol 108 (90 Base) MCG/ACT inhaler Commonly known as: VENTOLIN HFA Inhale 2 puffs into the lungs every 4 (four) hours as needed for wheezing or shortness of breath.   ALPRAZolam 0.5 MG tablet Commonly known as: XANAX Take 1 tablet (0.5 mg total) by mouth 2 (two) times daily as needed for anxiety. What changed: when to take this   amiodarone 200 MG tablet Commonly known as: Pacerone Take 1 tablet (200 mg total) by mouth 2 (two) times daily.   apixaban 5 MG Tabs tablet Commonly known as: Eliquis Take 1 tablet (5 mg total) by mouth 2 (two) times daily.   B-D SINGLE USE SWABS REGULAR Pads 1 each by Other route daily.   B-D ULTRAFINE III SHORT PEN 31G X 8 MM Misc Generic drug: Insulin Pen Needle 1 each by Other route in the morning, at noon, in the evening, and at bedtime.   blood glucose meter kit and supplies Kit Dispense based on patient and insurance preference. Use up to four times daily as directed.   colchicine 0.6 MG tablet Take 0.5 tablets (0.3 mg total) by mouth daily.   docusate sodium 100 MG capsule Commonly known  as: COLACE Take 100 mg by mouth 2 (two) times daily.   DULoxetine 60 MG capsule Commonly known as: CYMBALTA Take 1 capsule (60 mg total) by mouth daily.   EPINEPHrine 0.3 mg/0.3 mL Soaj injection Commonly known as: EPI-PEN Inject 0.3 mg into the muscle as needed for anaphylaxis.   insulin aspart 100 UNIT/ML injection Commonly known as: NovoLOG Inject 12 Units into the skin 3 (three) times daily before meals. For blood sugars 0-199 give 0 units of insulin, 201-250 give 4 units, 251-300 give 6 units, 301-350 give 8 units, 351-400 give 10 units,> 400 give 12 units and call M.D. What changed: how much to take   Levemir 100 UNIT/ML injection Generic drug: insulin detemir INJECT 0.4 MLS (40 UNITS TOTAL) INTO THE SKIN 2 (TWO) TIMES DAILY. What changed: See the new instructions.   MULTIVITAMIN WOMEN PO Take 1 tablet by mouth daily.   nitroGLYCERIN 0.4 MG SL tablet Commonly known as: NITROSTAT Place 1 tablet (0.4 mg total) under the tongue every 5 (five) minutes x 3 doses as needed for chest pain.   OneTouch Verio test strip Generic drug: glucose blood Use to check blood sugar TID. E11.69   pantoprazole 40 MG tablet Commonly known as: PROTONIX Take 40 mg by mouth 2 (two) times daily.   potassium chloride SA 20 MEQ tablet Commonly known as: KLOR-CON Take 2 tablets (40 mEq total) by mouth daily.   Restasis 0.05 % ophthalmic emulsion Generic drug: cycloSPORINE Place 1 drop into both eyes 2 (two) times daily.   rosuvastatin 10 MG tablet Commonly known as: CRESTOR Take 1 tablet (10 mg total) by mouth daily.   torsemide 20 MG tablet Commonly known as: DEMADEX Take 80 mg by mouth daily.   Trulicity 1.5 MG/0.5ML Sopn Generic drug: Dulaglutide INJECT 1.5 MG INTO THE SKIN ONCE A WEEK.               Durable Medical Equipment  (From admission, onward)           Start  Ordered   02/28/21 1041  Heart failure home health orders  (Heart failure home health orders /  Face to face)  Once       Comments: Heart Failure Follow-up Care:  Verify follow-up appointments per Patient Discharge Instructions. Confirm transportation arranged. Reconcile home medications with discharge medication list. Remove discontinued medications from use. Assist patient/caregiver to manage medications using pill box. Reinforce low sodium food selection Assessments: Vital signs and oxygen saturation at each visit. Assess home environment for safety concerns, caregiver support and availability of low-sodium foods. Consult Education officer, museum, PT/OT, Dietitian, and CNA based on assessments. Perform comprehensive cardiopulmonary assessment. Notify MD for any change in condition or weight gain of 3 pounds in one day or 5 pounds in one week with symptoms. Daily Weights and Symptom Monitoring: Ensure patient has access to scales. Teach patient/caregiver to weigh daily before breakfast and after voiding using same scale and record.    Teach patient/caregiver to track weight and symptoms and when to notify Provider. Activity: Develop individualized activity plan with patient/caregiver.   Question Answer Comment  Heart Failure Follow-up Care Advanced Heart Failure (AHF) Clinic at [phone]   Lab frequency Other see comments   Fax lab results to Other see comments   Diet Low Sodium Heart Healthy   Fluid restrictions: 2000 mL Fluid   Consult: Social work      02/28/21 1041            Disposition   The patient will be discharged in stable condition to home. Discharge Instructions     (HEART FAILURE PATIENTS) Call MD:  Anytime you have any of the following symptoms: 1) 3 pound weight gain in 24 hours or 5 pounds in 1 week 2) shortness of breath, with or without a dry hacking cough 3) swelling in the hands, feet or stomach 4) if you have to sleep on extra pillows at night in order to breathe.   Complete by: As directed    Diet - low sodium heart healthy   Complete by: As directed     Heart Failure patients record your daily weight using the same scale at the same time of day   Complete by: As directed    Increase activity slowly   Complete by: As directed        Follow-up Information     [facility] Follow up on 03/10/2021.   Specialty: Cardiology Why: at 3:30 . Bring all your meds . Contact information: [address] [id number] mc [place] [phone]        Shirley Friar, PA-C Follow up on 03/17/2021.   Specialty: Physician Assistant Why: at 9:00 Contact information: [place] [address] [phone]                   Duration of Discharge Encounter: Greater than 35 minutes   Signed, Darrick Grinder  NP-C  02/28/2021, 11:06 AM    Ok for d/c today. Will need close f/u in HF clinic. Continue amio and Eliquis. Consider addition of SGLT2i +/- spiro soon.    Glori Bickers, MD  1:56 PM

## 2021-02-28 NOTE — Plan of Care (Signed)
  Problem: Education: Goal: Knowledge of General Education information will improve Description: Including pain rating scale, medication(s)/side effects and non-pharmacologic comfort measures Outcome: Adequate for Discharge   Problem: Health Behavior/Discharge Planning: Goal: Ability to manage health-related needs will improve Outcome: Adequate for Discharge   Problem: Clinical Measurements: Goal: Ability to maintain clinical measurements within normal limits will improve Outcome: Adequate for Discharge Goal: Will remain free from infection Outcome: Adequate for Discharge Goal: Diagnostic test results will improve Outcome: Adequate for Discharge Goal: Respiratory complications will improve Outcome: Adequate for Discharge Goal: Cardiovascular complication will be avoided Outcome: Adequate for Discharge   Problem: Activity: Goal: Risk for activity intolerance will decrease Outcome: Adequate for Discharge   Problem: Nutrition: Goal: Adequate nutrition will be maintained Outcome: Adequate for Discharge   Problem: Coping: Goal: Level of anxiety will decrease Outcome: Adequate for Discharge   Problem: Elimination: Goal: Will not experience complications related to bowel motility Outcome: Adequate for Discharge Goal: Will not experience complications related to urinary retention Outcome: Adequate for Discharge   Problem: Pain Managment: Goal: General experience of comfort will improve Outcome: Adequate for Discharge   Problem: Safety: Goal: Ability to remain free from injury will improve Outcome: Adequate for Discharge   Problem: Skin Integrity: Goal: Risk for impaired skin integrity will decrease Outcome: Adequate for Discharge   Problem: Education: Goal: Ability to demonstrate management of disease process will improve Outcome: Adequate for Discharge Goal: Ability to verbalize understanding of medication therapies will improve Outcome: Adequate for Discharge Goal:  Individualized Educational Video(s) Outcome: Adequate for Discharge   Problem: Activity: Goal: Capacity to carry out activities will improve Outcome: Adequate for Discharge   Problem: Cardiac: Goal: Ability to achieve and maintain adequate cardiopulmonary perfusion will improve Outcome: Adequate for Discharge   

## 2021-02-28 NOTE — TOC Transition Note (Signed)
Transition of Care [facility]) - CM/SW Discharge Note   Patient Details  Name: [name] MRN: [id number] Date of Birth: November 18, 1955  [facility]) CM/SW Contact:  Zenon Mayo, RN Phone Number: 02/28/2021, 11:57 AM   Clinical Narrative:    NCM notified Stacie with [facility] patient is for dc today.     Final next level of care: [place] Barriers to Discharge: No Barriers Identified   Patient Goals and CMS Choice Patient states their goals for this hospitalization and ongoing recovery are:: return home      Discharge Placement                       Discharge Plan and Services                                     Social Determinants of Health (SDOH) Interventions     Readmission Risk Interventions Readmission Risk Prevention Plan 02/10/2021 01/11/2021 03/08/2020  Transportation Screening Complete Complete Complete  Medication Review Press photographer) Complete - Complete  PCP or Specialist appointment within 3-5 days of discharge Complete Complete Complete  HRI or Home Care Consult Complete Complete Complete  SW Recovery Care/Counseling Consult Complete Complete Complete  Palliative Care Screening Not Applicable Not Applicable Not [place] Not Applicable Not Applicable Not Applicable  Some recent data might be hidden

## 2021-02-28 NOTE — Progress Notes (Addendum)
Patient ID: [name], female   DOB: Apr 18, 1956, 65 y.o.   MRN: [id number]     Advanced Heart Failure Rounding Note  PCP-Cardiologist: Buford Dresser, MD   Subjective:    7/9 Converted to NSR on IV amio 7/10 Continued on IV amio and diuretics held.   Remains on amio drip.   Creatinine trending up 2>2.35>2.5  Denies SOB.   Objective:   Weight Range: 123.2 kg Body mass index is 42.54 kg/m.   Vital Signs:   Temp:  [97.7 F (36.5 C)-98.7 F (37.1 C)] 97.8 F (36.6 C) (07/11 0730) Pulse Rate:  [85-89] 85 (07/10 1544) Resp:  [17-23] 17 (07/11 0730) BP: (112-126)/(64-102) 114/71 (07/11 0730) SpO2:  [98 %-100 %] 100 % (07/10 1544) Weight:  [123.2 kg] 123.2 kg (07/11 0500) Last BM Date: 02/27/21  Weight change: Filed Weights   02/26/21 0338 02/27/21 0409 02/28/21 0500  Weight: 124.8 kg 123 kg 123.2 kg    Intake/Output:   Intake/Output Summary (Last 24 hours) at 02/28/2021 0743 Last data filed at 02/28/2021 0200 Gross per 24 hour  Intake 1244.45 ml  Output 700 ml  Net 544.45 ml      Physical Exam   General:  No resp difficulty HEENT: normal Neck: supple. no JVD. Carotids 2+ bilat; no bruits. No lymphadenopathy or thryomegaly appreciated. Cor: PMI nondisplaced. Regular rate & rhythm. No rubs, gallops or murmurs. Lungs: clear Abdomen: obese, soft, nontender, nondistended. No hepatosplenomegaly. No bruits or masses. Good bowel sounds. Extremities: no cyanosis, clubbing, rash, edema Neuro: alert & orientedx3, cranial nerves grossly intact. moves all 4 extremities w/o difficulty. Affect pleasant   Telemetry   SR 80s personally reviewed.   EKG SR with 1st degree heart block.86 bpm.    Labs    CBC Recent Labs    02/25/21 0909 02/27/21 0541 02/28/21 0009  WBC 6.7 9.4 7.9  NEUTROABS 3.7  --   --   HGB 10.1* 10.0* 9.0*  HCT 33.3* 31.2* 28.1*  MCV 90.7 86.2 86.7  PLT 159 128* 627*   Basic Metabolic Panel Recent Labs    02/27/21 0541  02/28/21 0009  NA 135 137  K 3.4* 3.3*  CL 95* 95*  CO2 30 31  GLUCOSE 219* 206*  BUN 25* 25*  CREATININE 2.35* 2.46*  CALCIUM 9.2 9.0  MG 1.7 1.9   Liver Function Tests No results for input(s): AST, ALT, ALKPHOS, BILITOT, PROT, ALBUMIN in the last 72 hours. No results for input(s): LIPASE, AMYLASE in the last 72 hours. Cardiac Enzymes No results for input(s): CKTOTAL, CKMB, CKMBINDEX, TROPONINI in the last 72 hours.  BNP: BNP (last 3 results) Recent Labs    02/07/21 2105 02/23/21 1011 02/25/21 0909  BNP 74.1 204.3* 162.7*    ProBNP (last 3 results) No results for input(s): PROBNP in the last 8760 hours.   D-Dimer No results for input(s): DDIMER in the last 72 hours. Hemoglobin A1C No results for input(s): HGBA1C in the last 72 hours. Fasting Lipid Panel No results for input(s): CHOL, HDL, LDLCALC, TRIG, CHOLHDL, LDLDIRECT in the last 72 hours. Thyroid Function Tests No results for input(s): TSH, T4TOTAL, T3FREE, THYROIDAB in the last 72 hours.  Invalid input(s): FREET3  Other results:   Imaging    No results found.   Medications:     Scheduled Medications:  apixaban  5 mg Oral BID   docusate sodium  100 mg Oral BID   DULoxetine  60 mg Oral Daily   insulin aspart  0-20 Units  Subcutaneous TID WC   pantoprazole  40 mg Oral BID   potassium chloride  40 mEq Oral Once   rosuvastatin  10 mg Oral Daily   sodium chloride flush  3 mL Intravenous Q12H    Infusions:  sodium chloride     amiodarone 60 mg/hr (02/28/21 0657)   magnesium sulfate bolus IVPB      PRN Medications: sodium chloride, acetaminophen, metoCLOPramide (REGLAN) injection, ondansetron (ZOFRAN) IV, sodium chloride flush   Assessment/Plan   1. Atrial flutter: Patient is in atypical atrial flutter with RVR.  She was out of amiodarone for about a week.  She had Maze in 5/22 and DCCV in 6/22. -Chemically converted to SR on IV amio. Stop IV amio . Start amio 200 mg twice a day.  -  Continue eliquis 5 mg twice a day.  2. Acute systolic CHF: Prior echo with EF 55%.  Echo this admission in setting of AFL with RVR showed EF 30-35%, moderate LVH, moderate RV dysfunction, PASP 48, s/p MV repair with mild MR and mean gradient 10 mmHg with HR around 130, s/p TV repair with mild-moderate TR, dilated IVC.  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   - Volume status improved. Renal function trending up. Supp K.  - Restart torsemide 80 mg daily. Supp K.   - with new reduction in EF need to focus on keeping her in NSR and titrating GDMT as BP and renal function tolerates - consider addition of SGLT2i +/- spiro as outpatient 3. CKD stage 3b: Creatinine 2.2 => 2.05 -> 2.35-->2.46  near baseline.  4. Valvular heart disease - S/p MV and TV repairs in 5/22: Echo this admission with mild-moderate TR.  There is only mild MR, but mean gradient across the valve is 10 mmHg suggesting mitral stenosis.  However, HR was also around 130, so would favor repeat limited echo for MV assessment when HR is lower/in NSR.  - Will limited ECHO today.  5. Hypokalemia/hypomagnesemia - supp K  - Give 2 grams mag.  6. DM2 - last HGBA1c 7.4 in 6/22 - consider SGLT2i in am if renal function stable  Will get limited ECHO for EF now that she is in SR.  Will make sure she has amio in hand when she discharges.    Length of Stay: 3  Amy Clegg, NP  02/28/2021, 7:43 AM  Advanced Heart Failure Team Pager [phone] (M-F; 7a - 5p)  Please contact [facility] for night-coverage after hours (5p -7a ) and weekends on [email or web address]  Patient seen and examined with the above-signed Advanced Practice Provider and/or Housestaff. I personally reviewed laboratory data, imaging studies and relevant notes. I independently examined the patient and formulated the important aspects of the plan. I have edited the note to reflect any of my changes or salient points. I have personally discussed the plan with the patient and/or  family.  Remains in NSR. Volume status looks good. SCr up slightly. Echo with EF 45-50%   General:  Well appearing. No resp difficulty HEENT: normal Neck: supple. no JVD. Carotids 2+ bilat; no bruits. No lymphadenopathy or thryomegaly appreciated. Cor: PMI nondisplaced. Regular rate & rhythm. No rubs, gallops or murmurs. Lungs: clear Abdomen: obese soft, nontender, nondistended. No hepatosplenomegaly. No bruits or masses. Good bowel sounds. Extremities: no cyanosis, clubbing, rash, edema Neuro: alert & orientedx3, cranial nerves grossly intact. moves all 4 extremities w/o difficulty. Affect pleasant   Ok for d/c today. Will need close f/u in HF clinic. Continue amio and Eliquis.  Consider addition of SGLT2i +/- spiro soon.   Glori Bickers, MD  1:56 PM

## 2021-02-28 NOTE — Progress Notes (Signed)
Note that patient converted to NSR over weekend and remains so this am on amio by EKG.   Will make outpatient follow up with EP.   EP will see as needed while here, please call with any concerns or questions.   Above reviewed with Dr. Ronni Rumble "[address] [place], [place]  02/28/2021 8:07 AM

## 2021-03-01 ENCOUNTER — Encounter: Payer: Self-pay | Admitting: Podiatry

## 2021-03-01 ENCOUNTER — Telehealth: Payer: Self-pay

## 2021-03-01 ENCOUNTER — Ambulatory Visit (INDEPENDENT_AMBULATORY_CARE_PROVIDER_SITE_OTHER): Payer: Medicare Other | Admitting: Podiatry

## 2021-03-01 ENCOUNTER — Other Ambulatory Visit: Payer: Self-pay

## 2021-03-01 DIAGNOSIS — M79675 Pain in left toe(s): Secondary | ICD-10-CM

## 2021-03-01 DIAGNOSIS — B351 Tinea unguium: Secondary | ICD-10-CM

## 2021-03-01 DIAGNOSIS — M79674 Pain in right toe(s): Secondary | ICD-10-CM

## 2021-03-01 DIAGNOSIS — E1142 Type 2 diabetes mellitus with diabetic polyneuropathy: Secondary | ICD-10-CM

## 2021-03-01 NOTE — Telephone Encounter (Signed)
Transition Care Management Follow-up Telephone Call Date of discharge and from where: 02/28/2021, [facility]  How have you been since you were released from the hospital? She said she is feeling fine.  Any questions or concerns? Yes - She would like Ms Oletta Lamas NP to call her . She is requesting  a recommendation for OTC medication for nasal congestion. She said she is especially congested in the morning.   Items Reviewed: Did the pt receive and understand the discharge instructions provided? Yes  Medications obtained and verified? Yes - she said she has all medications and did not have any questions about her med regime.  She said she is aware of what she is to stop taking.  Other? No  Any new allergies since your discharge? No  Do you have support at home? Yes   Home Care and Equipment/Supplies: Were home health services ordered? yes If so, what is the name of the agency? [facility]  Has the agency set up a time to come to the patient's home? No,not yet.  She was receiving services prior to her hospitalization  Were any new equipment or medical supplies ordered?  No What is the name of the medical supply agency? N/a Were you able to get the supplies/equipment? not applicable Do you have any questions related to the use of the equipment or supplies? No  Has a glucometer  Functional Questionnaire: (I = Independent and D = Dependent) ADLs: independent. Uses cane or walker with ambulation.   Follow up appointments reviewed:  PCP Hospital f/u appt confirmed? Yes  Scheduled to see Juluis Mire, NP  on 03/10/2021 @ 1050. [facility] f/u appt confirmed? Yes  Scheduled to see podiatry- 03/01/2021; cardiology - 03/10/2021 Are transportation arrangements needed? No  If their condition worsens, is the pt aware to call PCP or go to the Emergency Dept.? Yes Was the patient provided with contact information for the PCP's office or ED? Yes Was to pt encouraged to call back with  questions or concerns? Yes

## 2021-03-02 ENCOUNTER — Other Ambulatory Visit (HOSPITAL_COMMUNITY): Payer: Self-pay | Admitting: Cardiology

## 2021-03-02 ENCOUNTER — Other Ambulatory Visit (INDEPENDENT_AMBULATORY_CARE_PROVIDER_SITE_OTHER): Payer: Self-pay | Admitting: Primary Care

## 2021-03-04 NOTE — Progress Notes (Signed)
Subjective: [name] is a pleasant 65 y.o. female patient seen today painful thick toenails that are difficult to trim. Pain interferes with ambulation. Aggravating factors include wearing enclosed shoe gear. Pain is relieved with periodic professional debridement.  She voices no new pedal problems. States she was hospitalized recently and diagnosed with afib.  She states her blood glucose was 125 mg/dl today.  PCP is [name], NP. Last visit was: 02/15/2021.  Allergies  Allergen Reactions   Sulfa Antibiotics Itching    Objective: Physical Exam  General: [name] is a pleasant 65 y.o. African American female, morbidly obese in NAD. AAO x 3.   Vascular:  Capillary refill time to digits immediate b/l. Palpable DP pulse(s) b/l lower extremities Nonpalpable PT pulse(s) b/l lower extremities. Pedal hair absent. Lower extremity skin temperature gradient within normal limits. No pain with calf compression b/l. +1 pitting edema b/l lower extremities.  Dermatological:  Pedal skin with normal turgor, texture and tone b/l lower extremities No open wounds b/l lower extremities No interdigital macerations b/l lower extremities Toenails 1-5 b/l elongated, discolored, dystrophic, thickened, crumbly with subungual debris and tenderness to dorsal palpation.  Musculoskeletal:  Normal muscle strength 5/5 to all lower extremity muscle groups bilaterally. No pain crepitus or joint limitation noted with ROM b/l. No gross bony deformities bilaterally. Utilizes cane for ambulation assistance.  Neurological:  Protective sensation diminished with 10g monofilament b/l. Vibratory sensation diminished b/l.  Assessment and Plan:  1. Pain due to onychomycosis of toenails of both feet   2. Diabetic peripheral neuropathy associated with type 2 diabetes mellitus ([place])      -Continue diabetic foot care principles. -Patient to continue soft, supportive shoe gear daily. -Toenails  1-5 b/l were debrided in length and girth with sterile nail nippers and dremel without iatrogenic bleeding.  -Patient to report any pedal injuries to medical professional immediately. -Patient/POA to call should there be question/concern in the interim.  Return in about 3 months (around 06/01/2021).  Marzetta Board, DPM

## 2021-03-08 ENCOUNTER — Telehealth (INDEPENDENT_AMBULATORY_CARE_PROVIDER_SITE_OTHER): Payer: Self-pay | Admitting: Primary Care

## 2021-03-08 NOTE — Telephone Encounter (Signed)
Pt was discharged from hospital on 7.11.22 but no continuation of  home health orders were placed/ they just found out this week of the discharge / Darlene wanted verbal orders for resumption of care / please advise

## 2021-03-09 NOTE — Progress Notes (Addendum)
Advanced Heart Failure Clinic Note   PCP: [name], NP PCP-Cardiologist: Buford Dresser, MD  [facility]: Dr. 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   HPI:  [name] is a 65 year old female with morbid obesity, chronic diastolic and systolic HF with mildly reduced LVEF (45-50%), severe MR/TR, atrial fibrillation on eliquis, HTN, and DMII who is followed by Dr. Harrell Gave as an out-patient. She has a known history of HF with mildly reduced LVEF of 45-50% with cath in 2019 with mild, non-obstructive CAD. Also with history of difficult to control Afib with failed Tikosyn, later switched to amiodarone, dig, metop and apixaban for [facility]. Saw Dr. Harrell Gave in clinic on 11/22/20 where she was experiencing worsening SOB, weight gain, LE edema and chest pressure. She declined ER visit at that time. She was changed from lasix to torsemide for diuresis. Also planned for CV surgery evaluation for severe MR.    Admitted 12/19/20 with worsening HF.  LHC 5/19 non-obstructive CAD. RHC 12/22/20 with elevated filling pressures and low CI.    Underwent MV/TV repair with Maze on 12/30/20. Post-operatively, she struggled  with low co-ox, progressive renal failure and volume overload. AHF team was consulted to assist w/ further management. Post operative Echo showed LVEF 55% RV moderately HK. Moderate TR. No significant MR. No effusion. Hemodynamics improved w/ milrinone. Suspected of having post-op ATN. Nephrology assisted w/ transition back to PO diuretics. She was discharged on 5/28. D/w wt was 281 lb. SCr was 2.8. Nephrology recommended home on torsemide 60 mg daily. She was also in Afib day of d/c. Recommendations were to consider DC-CV in several weeks after atrial irritation settles down from Maze.   She was seen in clinic 01/25/21 for post hospital f/u  w/ daughter. Feels bad. Complaints of exertional dyspnea,  nausea/ vomiting, poor appetite and abdominal discomfort. Remains in Afib on EKG, HR 100. BP soft but no orthostatic symptoms. ReDs clip 42%. Plan for DCCV.  She was seen in ED 01/29/21 with SOB and CP, with decreased urinary output. She was admitted for A/CHF, diuresed with IV lasix, RHC showed low filling pressures and moderately reduced CO. She underwent successful DCCV 02/01/21. 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 She was discharged with CR referral and instructions to follow up with nephrology.  Seen in ED 02/07/21 for leg swelling & CP. She was back in Afib, rate controlled. She was loaded w amiodarone gtt, repeat successful DCCV on 02/09/21. 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   She returned to HF clinic on 02/22/21 for post hospital follow after cardioversion. She was back in A fib and volume overloaded. Torsemide was increased to 80 mg twice a day and amio was increased to 200 mg twice a day. (She did not tell staff she was out of amio).    She returned to HF clinic 2 days later and was in A fib RVR with rate in the 130s. SOB with minimal exertion. She was sent to the ED for further evaluation. She had been out of amiodarone for the last 7 days. SOB with exertion.  EP consulted. Remains in A fib RVR. CXR with vascular congestion. She converted to NSR on IV amiodarone. SCr trended up and diuretics held. Repeat echo showed new reduction in EF, in setting of  AFL with RVR,  EF 30-35%, moderate LVH, moderate RV dysfunction, PASP 48, s/p MV repair with mild MR and mean gradient 10 mmHg with HR around 130, s/p TV repair with mild-moderate TR, dilated IVC. Transitioned to po amio and torsemide, discharge weight 271 lbs.  Today she returns for post hospitalization HF follow up. Overall feeling fine. Denies increasing SOB, CP, dizziness, edema,  or PND/Orthopnea. Appetite ok, struggling with nausea. No fever or chills. Weight at home 255 pounds. Taking all medications. She took 3 metoprolol pills since discharge, unaware she was not supposed to stop. She has not needed extra torsemide.   ECG (personally reviewed): SR w/ PAC, qrs 531 ms, PR 112 ms (personally reviewed).   Cardiac Studies  - Echo (11/13): EF 60-65%, moderate TR - Echo (11/14): EF 60-65%, severe TR, PA peak pressure 47 mmHg - Echo (5/15): EF 50-55% - Exercise stress test (7/6): A. Fib. with controlled vent. response OLD inferior MI and non sp. T wave changes at baseline. - Echo (8/17): EF 50-55%, moderate MR - Exercise stress test (8/17): Atrial Fib. Q wave in lead 3 and AVF with nonsp T wave changes at baseline - Echo (5/19): EF 55-60%, moderate MR - Exercise stress test (5/19): Afib, normal. - LHC (5/19): Prox LAD lesion is 10% stenosed, Dist LM to Ost LAD lesion is 10% stenosed, Ost Cx lesion is 15% stenosed, LV systolic function normal, LVEDP is normal. - Echo (11/19): EF 45-50%, severe MR/TR - Echo (11/21): EF 45-50%, severe MR (rheumatic)/TR - Echo (4/22): EF 45-50%, severe MR mean gradient 2.0 mmHg, severe TR - Echo (5/22): EF 60-65%, s/p MVR, trace MR mean gradient 7.0 mmHg, moderate TR - RHC (5/22): Elevated R > L heart filling pressures with PAPi low but not markedly so (1.8) suggestive of a significant component of RV dysfunction. Prominent v-waves in PCWP and RA pressure tracings suggestive of significant MR and TR. Cardiac index low. - RHC (6/22): low filling pressures, moderate reduced CO. - Echo (7/22): EF 30-35%, moderate LVH, moderate RV dysfunction, PASP 48, s/p MV repair with mild MR and mean gradient 10 mmHg with HR around 130, s/p TV repair with mild-moderate TR, dilated IVC.   RA = 5 RV = 33/2 PA = 35/13 (22) PCW = 13 Fick cardiac output/index = 4.3/2.0 PVR = 2.1 WU FA sat = 97% PA sat = 54%, 54% - DCCV (02/01/21): successful conversion to  NSR. - DCCV (02/09/21): successful conversion to NSR.  ROS: All systems reviewed and negative except as per HPI.   Past Medical History:  Diagnosis Date   Allergic rhinitis    Arthritis    Asthma    Chest pain 12/27/2017   Chronic diastolic CHF (congestive heart failure) (HCC)    COPD (chronic obstructive pulmonary disease) (HCC)    Depression    DM (diabetes mellitus) ([place])    DVT (deep vein thrombosis) in pregnancy    Dysrhythmia    atrial fibrilation   GERD (gastroesophageal reflux disease)    Headache(784.0)    HTN (hypertension)    Hyperlipidemia    Obesity    Pneumonia 04/2018   RIGHT LOBE   Shortness of breath    Sleep apnea    compliant with CPAP   Current Outpatient Medications  Medication Sig Dispense Refill   Accu-Chek Softclix Lancets lancets Use as instructed to check blood sugar three times daily. E11.69 100 each 12   albuterol (VENTOLIN HFA) 108 (90 Base) MCG/ACT inhaler Inhale 2 puffs into the  lungs every 4 (four) hours as needed for wheezing or shortness of breath.      Alcohol Swabs (B-D SINGLE USE SWABS REGULAR) PADS 1 each by Other route daily.     ALPRAZolam (XANAX) 0.5 MG tablet Take 1 tablet (0.5 mg total) by mouth 2 (two) times daily as needed for anxiety. 30 tablet 0   amiodarone (PACERONE) 200 MG tablet Take 1 tablet (200 mg total) by mouth 2 (two) times daily. 60 tablet 3   apixaban (ELIQUIS) 5 MG TABS tablet Take 1 tablet (5 mg total) by mouth 2 (two) times daily. 180 tablet 1   B-D ULTRAFINE III SHORT PEN 31G X 8 MM MISC 1 each by Other route in the morning, at noon, in the evening, and at bedtime.     blood glucose meter kit and supplies KIT Dispense based on patient and insurance preference. Use up to four times daily as directed. 1 each 0   Blood Glucose Monitoring Suppl (ACCU-CHEK GUIDE ME) w/Device KIT Use as instructed to check blood sugar three times daily. E11.69 1 kit 0   Blood Glucose Monitoring Suppl (ONETOUCH VERIO) w/Device KIT Use to  check blood sugar TID. E11.69 1 kit 0   colchicine 0.6 MG tablet Take 0.5 tablets (0.3 mg total) by mouth daily. 90 tablet 0   docusate sodium (COLACE) 100 MG capsule Take 100 mg by mouth 2 (two) times daily.     DULoxetine (CYMBALTA) 60 MG capsule Take 1 capsule (60 mg total) by mouth daily. 90 capsule 3   EPINEPHrine 0.3 mg/0.3 mL IJ SOAJ injection Inject 0.3 mg into the muscle as needed for anaphylaxis. 1 each 1   glucose blood (ONETOUCH VERIO) test strip Use to check blood sugar TID. E11.69 100 each 6   insulin aspart (NOVOLOG) 100 UNIT/ML injection Inject 12 Units into the skin 3 (three) times daily before meals. For blood sugars 0-199 give 0 units of insulin, 201-250 give 4 units, 251-300 give 6 units, 301-350 give 8 units, 351-400 give 10 units,> 400 give 12 units and call M.D. 10 mL 3   LEVEMIR 100 UNIT/ML injection INJECT 0.4 MLS (40 UNITS TOTAL) INTO THE SKIN 2 (TWO) TIMES DAILY. 60 mL 1   Multiple Vitamins-Minerals (MULTIVITAMIN WOMEN PO) Take 1 tablet by mouth daily.     nitroGLYCERIN (NITROSTAT) 0.4 MG SL tablet Place 1 tablet (0.4 mg total) under the tongue every 5 (five) minutes x 3 doses as needed for chest pain. 25 tablet 12   OneTouch Delica Lancets 35K MISC Use to check blood sugar TID.  E11.69 100 each 6   pantoprazole (PROTONIX) 40 MG tablet Take 40 mg by mouth 2 (two) times daily.     potassium chloride SA (KLOR-CON) 20 MEQ tablet Take 2 tablets (40 mEq total) by mouth daily. 60 tablet 6   RESTASIS 0.05 % ophthalmic emulsion Place 1 drop into both eyes 2 (two) times daily.  12   rosuvastatin (CRESTOR) 10 MG tablet Take 1 tablet (10 mg total) by mouth daily. 90 tablet 1   torsemide (DEMADEX) 20 MG tablet TAKE 4 TABLETS BY MOUTH DAILY. 093 tablet 1   TRULICITY 1.5 GH/8.2XH SOPN INJECT 1.5 MG INTO THE SKIN ONCE A WEEK. 0.5 mL 1   No current facility-administered medications for this encounter.   Allergies  Allergen Reactions   Sulfa Antibiotics Itching   Social History    Socioeconomic History   Marital status: Significant Other    Spouse name: Not on file  Number of children: 2   Years of education: Not on file   Highest education level: Some college, no degree  Occupational History   Not on file  Tobacco Use   Smoking status: Never   Smokeless tobacco: Never  Vaping Use   Vaping Use: Never used  Substance and Sexual Activity   Alcohol use: No   Drug use: No   Sexual activity: Not Currently  Other Topics Concern   Not on file  Social History Narrative   Pt lives in [place] with spouse.  2 grown children.   Previously worked in [employer] at [employer].  Now on disability   Social Determinants of Health   Financial Resource Strain: Medium Risk   Difficulty of Paying Living Expenses: Somewhat hard  Food Insecurity: No Food Insecurity   Worried About Charity fundraiser in the Last Year: Never true   Ran Out of Food in the Last Year: Never true  Transportation Needs: Unmet Transportation Needs   Lack of Transportation (Medical): Yes   Lack of Transportation (Non-Medical): No  Physical Activity: Not on file  Stress: Not on file  Social Connections: Not on file  Intimate Partner Violence: Not on file   Family History  Problem Relation Age of Onset   Cancer Mother    Hypertension Mother    Cancer Father    CAD Other    Hypertension Sister    Hypertension Brother    BP 128/80   Pulse 99   Wt 125 kg (275 lb 9.6 oz)   SpO2 98%   BMI 43.17 kg/m   Wt Readings from Last 3 Encounters:  03/10/21 125 kg (275 lb 9.6 oz)  02/28/21 123.2 kg (271 lb 9.7 oz)  02/25/21 123.9 kg (273 lb 3.2 oz)   PHYSICAL EXAM: General:  NAD. No resp difficulty HEENT: Normal Neck: Supple. No JVD. Carotids 2+ bilat; no bruits. No lymphadenopathy or thryomegaly appreciated. Cor: PMI nondisplaced. Regular rate & rhythm. No rubs, gallops or murmurs. Lungs: Clear Abdomen: Obese, nontender, nondistended. No hepatosplenomegaly. No bruits or masses. Good bowel  sounds. Extremities: No cyanosis, clubbing, rash, edema (chronic lymphedema BLE) Neuro: Alert & oriented x 3, cranial nerves grossly intact. Moves all 4 extremities w/o difficulty. Affect pleasant.  ASSESSMENT & PLAN: 1. Atrial flutter: Patient was in atypical atrial flutter with RVR.  She was out of amiodarone for about a week.  She had Maze in 5/22 and DCCV in 6/22. She chemically converted to SR on IV amio. - Continue amiodarone 200 mg bid. - Continue Eliquis 5 mg bid.  2. Chronic systolic CHF: Prior echo with EF 55%.  Echo this admission in setting of AFL with RVR showed EF 30-35%, moderate LVH, moderate RV dysfunction, PASP 48, s/p MV repair with mild MR and mean gradient 10 mmHg with HR around 130, s/p TV repair with mild-moderate TR, dilated IVC.  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   - NYHA II-early III. Volume status much improved.  - Continue torsemide 80 mg daily.  - Restart Toprol 25 mg daily (This was stopped during most recent admission due to long PR interval. PR 112 ms today on ECG. Personally discussed with Dr. Aundra Dubin today.) - With new reduction in EF, need to keep her in NSR and titrate GDMT as BP and renal function tolerates. - Consider addition of SGLT2i +/- spiro next. 3. CKD stage 3b: BMET today. 4. Valvular heart disease: S/p MV and TV repairs in 5/22. Echo this admission with mild-moderate  TR.  There is only mild MR, but mean gradient across the valve is 10 mmHg suggesting mitral stenosis.  However, HR was also around 130, so would favor repeat limited echo for MV assessment when HR is lower/in NSR. - Limited echo showed improved EF 40-45%, MV mean gradient 7 mmHg (HR 82 bpm), moderate TR. 5. DM2 - Last HGBA1c 7.4 in 6/22 - Consider SGLT2i  6. Obesity Body mass index is 43.17 kg/m.  Follow up in 3-4 weeks with APP. Repeat echo in 3 months after optimization of GDMT.  Allena Katz, FNP-BC 03/10/21

## 2021-03-09 NOTE — Telephone Encounter (Signed)
Verbals given  

## 2021-03-10 ENCOUNTER — Ambulatory Visit (INDEPENDENT_AMBULATORY_CARE_PROVIDER_SITE_OTHER): Payer: Medicare Other | Admitting: Primary Care

## 2021-03-10 ENCOUNTER — Encounter (HOSPITAL_COMMUNITY): Payer: Self-pay

## 2021-03-10 ENCOUNTER — Ambulatory Visit (HOSPITAL_COMMUNITY)
Admission: RE | Admit: 2021-03-10 | Discharge: 2021-03-10 | Disposition: A | Discharge status: Home / Self Care | Payer: Medicare Other | Attending: Family Medicine | Admitting: Family Medicine

## 2021-03-10 ENCOUNTER — Other Ambulatory Visit: Payer: Self-pay

## 2021-03-10 VITALS — BP 128/80 | HR 99 | Wt 275.6 lb

## 2021-03-10 DIAGNOSIS — I5042 Chronic combined systolic (congestive) and diastolic (congestive) heart failure: Secondary | ICD-10-CM | POA: Diagnosis not present

## 2021-03-10 DIAGNOSIS — N1832 Chronic kidney disease, stage 3b: Secondary | ICD-10-CM | POA: Diagnosis not present

## 2021-03-10 DIAGNOSIS — I480 Paroxysmal atrial fibrillation: Secondary | ICD-10-CM

## 2021-03-10 DIAGNOSIS — Z596 Low income: Secondary | ICD-10-CM | POA: Diagnosis not present

## 2021-03-10 DIAGNOSIS — Z6841 Body Mass Index (BMI) 40.0 and over, adult: Secondary | ICD-10-CM | POA: Insufficient documentation

## 2021-03-10 DIAGNOSIS — I5022 Chronic systolic (congestive) heart failure: Secondary | ICD-10-CM | POA: Diagnosis not present

## 2021-03-10 DIAGNOSIS — I484 Atypical atrial flutter: Secondary | ICD-10-CM | POA: Insufficient documentation

## 2021-03-10 DIAGNOSIS — Z882 Allergy status to sulfonamides status: Secondary | ICD-10-CM | POA: Insufficient documentation

## 2021-03-10 DIAGNOSIS — E669 Obesity, unspecified: Secondary | ICD-10-CM | POA: Diagnosis not present

## 2021-03-10 DIAGNOSIS — I5031 Acute diastolic (congestive) heart failure: Secondary | ICD-10-CM | POA: Diagnosis not present

## 2021-03-10 DIAGNOSIS — Z8249 Family history of ischemic heart disease and other diseases of the circulatory system: Secondary | ICD-10-CM | POA: Insufficient documentation

## 2021-03-10 DIAGNOSIS — Z79899 Other long term (current) drug therapy: Secondary | ICD-10-CM | POA: Insufficient documentation

## 2021-03-10 DIAGNOSIS — E1122 Type 2 diabetes mellitus with diabetic chronic kidney disease: Secondary | ICD-10-CM | POA: Diagnosis not present

## 2021-03-10 DIAGNOSIS — I340 Nonrheumatic mitral (valve) insufficiency: Secondary | ICD-10-CM

## 2021-03-10 DIAGNOSIS — E1169 Type 2 diabetes mellitus with other specified complication: Secondary | ICD-10-CM

## 2021-03-10 DIAGNOSIS — Z794 Long term (current) use of insulin: Secondary | ICD-10-CM | POA: Insufficient documentation

## 2021-03-10 DIAGNOSIS — Z7901 Long term (current) use of anticoagulants: Secondary | ICD-10-CM | POA: Diagnosis not present

## 2021-03-10 DIAGNOSIS — I130 Hypertensive heart and chronic kidney disease with heart failure and stage 1 through stage 4 chronic kidney disease, or unspecified chronic kidney disease: Secondary | ICD-10-CM | POA: Insufficient documentation

## 2021-03-10 DIAGNOSIS — Z9889 Other specified postprocedural states: Secondary | ICD-10-CM

## 2021-03-10 DIAGNOSIS — I071 Rheumatic tricuspid insufficiency: Secondary | ICD-10-CM

## 2021-03-10 LAB — BASIC METABOLIC PANEL
Anion gap: 9 (ref 5–15)
BUN: 18 mg/dL (ref 8–23)
CO2: 32 mmol/L (ref 22–32)
Calcium: 9.4 mg/dL (ref 8.9–10.3)
Chloride: 101 mmol/L (ref 98–111)
Creatinine, Ser: 2.56 mg/dL — ABNORMAL HIGH (ref 0.44–1.00)
GFR, Estimated: 20 mL/min — ABNORMAL LOW (ref >60)
Glucose, Bld: 71 mg/dL (ref 70–99)
Potassium: 3.6 mmol/L (ref 3.5–5.1)
Sodium: 142 mmol/L (ref 135–145)

## 2021-03-10 MED ORDER — METOPROLOL SUCCINATE ER 25 MG PO TB24: 25.0000 mg | ORAL_TABLET | Freq: Every day | ORAL | 11 refills | Status: DC

## 2021-03-10 NOTE — Patient Instructions (Addendum)
START Toprol 25 mg, one tab daily  Labs today We will only contact you if something comes back abnormal or we need to make some changes. Otherwise no news is good news!  Your physician recommends that you schedule a follow-up appointment in: 4-6 weeks  in the Advanced Practitioners (PA/NP) Clinic   Do the following things EVERYDAY: Weigh yourself in the morning before breakfast. Write it down and keep it in a log. Take your medicines as prescribed Eat low salt foods--Limit salt (sodium) to 2000 mg per day.  Stay as active as you can everyday Limit all fluids for the day to less than 2 liters  milAt the Advanced Heart Failure Clinic, you and your health needs are our priority. As part of our continuing mission to provide you with exceptional heart care, we have created designated Provider Care Teams. These Care Teams include your primary Cardiologist (physician) and Advanced Practice Providers (APPs- Physician Assistants and Nurse Practitioners) who all work together to provide you with the care you need, when you need it.   You may see any of the following providers on your designated Care Team at your next follow up: Dr Glori Bickers Dr Loralie Champagne Dr Patrice Paradise, NP Lyda Jester, [place] Ginnie Smart Audry Riles, PharmD   Please be sure to bring in all your medications bottles to every appointment.   If you have any questions or concerns before your next appointment please send [place] a message through [place] or call our office at [phone].    TO LEAVE A MESSAGE FOR THE NURSE SELECT OPTION 2, PLEASE LEAVE A MESSAGE INCLUDING: YOUR NAME DATE OF BIRTH CALL BACK NUMBER REASON FOR CALL**this is important as we prioritize the call backs  YOU WILL RECEIVE A CALL BACK THE SAME DAY AS LONG AS YOU CALL BEFORE 4:00 PM

## 2021-03-14 ENCOUNTER — Telehealth (INDEPENDENT_AMBULATORY_CARE_PROVIDER_SITE_OTHER): Payer: Self-pay

## 2021-03-14 ENCOUNTER — Other Ambulatory Visit (INDEPENDENT_AMBULATORY_CARE_PROVIDER_SITE_OTHER): Payer: Self-pay | Admitting: Primary Care

## 2021-03-14 DIAGNOSIS — I5023 Acute on chronic systolic (congestive) heart failure: Secondary | ICD-10-CM

## 2021-03-14 NOTE — Telephone Encounter (Signed)
Referral received for home health services.  Patient had been re-referred to [facility] when she was recently discharged from the hospital.  Call placed to [facility], spoke to Darlene/Clinical Manager.  She explained that they have been trying to schedule a resumption of care visit with the patient but she keeps delaying. Darlene said it is questionable if the patient will even qualify for home health services because she is driving so her homebound status is in question. Darlene said that they would try to contact the patient one more time to schedule the home visit.  Call placed to patient.  She said she has been driving  to see her niece who is very ill with cancer.  Explained to the patient that she needs to contact [facility] if she wishes to resume home health services. Provided her with the phone number or [facility] and instructed her to ask to speak with Darlene.

## 2021-03-14 NOTE — Telephone Encounter (Signed)
Copied from [facility] [phone]. Topic: General - Other >> Mar 11, 2021  4:26 PM Pawlus, Brayton Layman A wrote: Reason for CRM: [facility] home health was not able to see the pt today, caller is requested a new order for home health to be faxed to [phone]

## 2021-03-14 NOTE — Telephone Encounter (Signed)
Please place new order so that it can be faxed.

## 2021-03-16 ENCOUNTER — Telehealth (INDEPENDENT_AMBULATORY_CARE_PROVIDER_SITE_OTHER): Payer: Self-pay | Admitting: Primary Care

## 2021-03-16 NOTE — Telephone Encounter (Signed)
Copied from [place] [phone]. Topic: Quick Communication - Home Health Verbal Orders >> Mar 16, 2021  4:27 PM Jodie Echevaria wrote: Caller/Agency: [facility] Number: [phone] ok to LM  Requesting OT/PT/Skilled Nursing/Social Work/Speech Therapy: Skilled nursing  Frequency: 1 w 4 for CHF

## 2021-03-16 NOTE — Progress Notes (Addendum)
PCP:  [name], NP Primary Cardiologist: Buford Dresser, MD Electrophysiologist: Thompson Grayer, MD   [name] is a 65 y.o. female seen today for Thompson Grayer, MD for post hospital follow up.    Admitted 7/8 - 7/11 with aflutter and systolic CHF. Diuresed and she chemically converted to NSR on IV amio. EP consulted for recurrent A flutter. She is felt to not currently be a candidate for PPM AV node ablation given recent MAZE.  Per Dr Quentin Ore would repeat ECHO in 6 months to come up with definitive plan.   Since discharge from hospital the patient reports doing OK. Denies palpitations or chest pain. Edema well controlled. Mild fatigue and leg cramps. Has had nausea that she wonders if is from Amiodarone. Has been referred to nephrology and has visit pending.   Past Medical History:  Diagnosis Date   Allergic rhinitis    Arthritis    Asthma    Chest pain 12/27/2017   Chronic diastolic CHF (congestive heart failure) (HCC)    COPD (chronic obstructive pulmonary disease) (HCC)    Depression    DM (diabetes mellitus) ([place])    DVT (deep vein thrombosis) in pregnancy    Dysrhythmia    atrial fibrilation   GERD (gastroesophageal reflux disease)    Headache(784.0)    HTN (hypertension)    Hyperlipidemia    Obesity    Pneumonia 04/2018   RIGHT LOBE   Shortness of breath    Sleep apnea    compliant with CPAP   Past Surgical History:  Procedure Laterality Date   ABDOMINAL HYSTERECTOMY     BUBBLE STUDY  11/26/2020   Procedure: BUBBLE STUDY;  Surgeon: Buford Dresser, MD;  Location: [place];  Service: Cardiovascular;;   CARDIOVERSION N/A 05/06/2020   Procedure: CARDIOVERSION;  Surgeon: Buford Dresser, MD;  Location: [facility] ENDOSCOPY;  Service: Cardiovascular;  Laterality: N/A;   CARDIOVERSION N/A 11/04/2020   Procedure: CARDIOVERSION;  Surgeon: Lelon Perla, MD;  Location: [place];  Service: Cardiovascular;  Laterality: N/A;    CARDIOVERSION N/A 02/01/2021   Procedure: CARDIOVERSION;  Surgeon: Jolaine Artist, MD;  Location: [place];  Service: Cardiovascular;  Laterality: N/A;   CARDIOVERSION N/A 02/09/2021   Procedure: CARDIOVERSION;  Surgeon: Jolaine Artist, MD;  Location: [place];  Service: Cardiovascular;  Laterality: N/A;   CATARACT EXTRACTION, BILATERAL     CHOLECYSTECTOMY     COLONOSCOPY WITH PROPOFOL N/A 03/05/2015   Procedure: COLONOSCOPY WITH PROPOFOL;  Surgeon: Carol Ada, MD;  Location: [facility] ENDOSCOPY;  Service: Endoscopy;  Laterality: N/A;   DIAGNOSTIC LAPAROSCOPY     ingrown hallux Left    KNEE SURGERY     LEFT HEART CATH AND CORONARY ANGIOGRAPHY N/A 12/31/2017   Procedure: LEFT HEART CATH AND CORONARY ANGIOGRAPHY;  Surgeon: Charolette Forward, MD;  Location: [place] [facility];  Service: Cardiovascular;  Laterality: N/A;   MAZE N/A 12/29/2020   Procedure: MAZE;  Surgeon: [name], MD;  Location: [place];  Service: Open Heart Surgery;  Laterality: N/A;   MITRAL VALVE REPAIR N/A 12/29/2020   Procedure: MITRAL VALVE REPAIR USING CARBOMEDICS ANNULOFLEX RING SIZE 30;  Surgeon: [name], MD;  Location: [place];  Service: Open Heart Surgery;  Laterality: N/A;   RIGHT HEART CATH N/A 12/22/2020   Procedure: RIGHT HEART CATH;  Surgeon: Larey Dresser, MD;  Location: [place] [facility];  Service: Cardiovascular;  Laterality: N/A;   RIGHT HEART CATH N/A 01/31/2021   Procedure: RIGHT HEART  CATH;  Surgeon: Jolaine Artist, MD;  Location: [place] [facility];  Service: Cardiovascular;  Laterality: N/A;   TEE WITHOUT CARDIOVERSION N/A 11/26/2020   Procedure: TRANSESOPHAGEAL ECHOCARDIOGRAM (TEE);  Surgeon: Buford Dresser, MD;  Location: [facility] ENDOSCOPY;  Service: Cardiovascular;  Laterality: N/A;   TEE WITHOUT CARDIOVERSION N/A 12/29/2020   Procedure: TRANSESOPHAGEAL ECHOCARDIOGRAM (TEE);  Surgeon: [name], MD;  Location: [place];  Service: Open Heart Surgery;   Laterality: N/A;   TRICUSPID VALVE REPLACEMENT N/A 12/29/2020   Procedure: 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 RING SIZE 34;  Surgeon: [name], MD;  Location: [place];  Service: Open Heart Surgery;  Laterality: N/A;    Current Outpatient Medications  Medication Sig Dispense Refill   Accu-Chek Softclix Lancets lancets Use as instructed to check blood sugar three times daily. E11.69 100 each 12   albuterol (VENTOLIN HFA) 108 (90 Base) MCG/ACT inhaler Inhale 2 puffs into the lungs every 4 (four) hours as needed for wheezing or shortness of breath.      Alcohol Swabs (B-D SINGLE USE SWABS REGULAR) PADS 1 each by Other route daily.     ALPRAZolam (XANAX) 0.5 MG tablet Take 1 tablet (0.5 mg total) by mouth 2 (two) times daily as needed for anxiety. 30 tablet 0   amiodarone (PACERONE) 200 MG tablet Take 1 tablet (200 mg total) by mouth 2 (two) times daily. 60 tablet 3   apixaban (ELIQUIS) 5 MG TABS tablet Take 1 tablet (5 mg total) by mouth 2 (two) times daily. 180 tablet 1   B-D ULTRAFINE III SHORT PEN 31G X 8 MM MISC 1 each by Other route in the morning, at noon, in the evening, and at bedtime.     blood glucose meter kit and supplies KIT Dispense based on patient and insurance preference. Use up to four times daily as directed. 1 each 0   Blood Glucose Monitoring Suppl (ACCU-CHEK GUIDE ME) w/Device KIT Use as instructed to check blood sugar three times daily. E11.69 1 kit 0   Blood Glucose Monitoring Suppl (ONETOUCH VERIO) w/Device KIT Use to check blood sugar TID. E11.69 1 kit 0   colchicine 0.6 MG tablet Take 0.5 tablets (0.3 mg total) by mouth daily. 90 tablet 0   docusate sodium (COLACE) 100 MG capsule Take 100 mg by mouth 2 (two) times daily.     DULoxetine (CYMBALTA) 60 MG capsule Take 1 capsule (60 mg total) by mouth daily. 90 capsule 3   EPINEPHrine 0.3 mg/0.3 mL IJ SOAJ injection Inject 0.3 mg into the muscle as needed for anaphylaxis. 1 each 1   glucose blood  (ONETOUCH VERIO) test strip Use to check blood sugar TID. E11.69 100 each 6   insulin aspart (NOVOLOG) 100 UNIT/ML injection Inject 12 Units into the skin 3 (three) times daily before meals. For blood sugars 0-199 give 0 units of insulin, 201-250 give 4 units, 251-300 give 6 units, 301-350 give 8 units, 351-400 give 10 units,> 400 give 12 units and call M.D. 10 mL 3   LEVEMIR 100 UNIT/ML injection INJECT 0.4 MLS (40 UNITS TOTAL) INTO THE SKIN 2 (TWO) TIMES DAILY. 60 mL 1   metoprolol succinate (TOPROL XL) 25 MG 24 hr tablet Take 1 tablet (25 mg total) by mouth daily. 30 tablet 11   Multiple Vitamins-Minerals (MULTIVITAMIN WOMEN PO) Take 1 tablet by mouth daily.     nitroGLYCERIN (NITROSTAT) 0.4 MG SL tablet Place 1 tablet (0.4 mg total) under the tongue every  5 (five) minutes x 3 doses as needed for chest pain. 25 tablet 12   OneTouch Delica Lancets 22G MISC Use to check blood sugar TID.  E11.69 100 each 6   pantoprazole (PROTONIX) 40 MG tablet Take 40 mg by mouth 2 (two) times daily.     potassium chloride SA (KLOR-CON) 20 MEQ tablet Take 2 tablets (40 mEq total) by mouth daily. 60 tablet 6   RESTASIS 0.05 % ophthalmic emulsion Place 1 drop into both eyes 2 (two) times daily.  12   rosuvastatin (CRESTOR) 10 MG tablet Take 1 tablet (10 mg total) by mouth daily. 90 tablet 1   torsemide (DEMADEX) 20 MG tablet TAKE 4 TABLETS BY MOUTH DAILY. 254 tablet 1   TRULICITY 1.5 YH/0.6CB SOPN INJECT 1.5 MG INTO THE SKIN ONCE A WEEK. 0.5 mL 1   No current facility-administered medications for this visit.    Allergies  Allergen Reactions   Sulfa Antibiotics Itching    Social History   Socioeconomic History   Marital status: Significant Other    Spouse name: Not on file   Number of children: 2   Years of education: Not on file   Highest education level: Some college, no degree  Occupational History   Not on file  Tobacco Use   Smoking status: Never   Smokeless tobacco: Never  Vaping Use   Vaping  Use: Never used  Substance and Sexual Activity   Alcohol use: No   Drug use: No   Sexual activity: Not Currently  Other Topics Concern   Not on file  Social History Narrative   Pt lives in [place] with spouse.  2 grown children.   Previously worked in [employer] at [employer].  Now on disability   Social Determinants of Health   Financial Resource Strain: Medium Risk   Difficulty of Paying Living Expenses: Somewhat hard  Food Insecurity: No Food Insecurity   Worried About Charity fundraiser in the Last Year: Never true   Ran Out of Food in the Last Year: Never true  Transportation Needs: Unmet Transportation Needs   Lack of Transportation (Medical): Yes   Lack of Transportation (Non-Medical): No  Physical Activity: Not on file  Stress: Not on file  Social Connections: Not on file  Intimate Partner Violence: Not on file     Review of Systems: General: No chills, fever, night sweats or weight changes  Cardiovascular:  No chest pain, dyspnea on exertion, edema, orthopnea, palpitations, paroxysmal nocturnal dyspnea Dermatological: No rash, lesions or masses Respiratory: No cough, dyspnea Urologic: No hematuria, dysuria Abdominal: No nausea, vomiting, diarrhea, bright red blood per rectum, melena, or hematemesis Neurologic: No visual changes, weakness, changes in mental status All other systems reviewed and are otherwise negative except as noted above.  Physical Exam: Vitals:   03/17/21 0857  BP: 124/72  Pulse: 87  SpO2: 97%  Weight: 266 lb 12.8 oz (121 kg)  Height: 5' 7" (1.702 m)    GEN- The patient is well appearing, alert and oriented x 3 today.   HEENT: normocephalic, atraumatic; sclera clear, conjunctiva pink; hearing intact; oropharynx clear; neck supple, no JVP Lymph- no cervical lymphadenopathy Lungs- Clear to ausculation bilaterally, normal work of breathing.  No wheezes, rales, rhonchi Heart- Regular rate and rhythm, no murmurs, rubs or gallops, PMI not  laterally displaced GI- soft, non-tender, non-distended, bowel sounds present, no hepatosplenomegaly Extremities- no clubbing, cyanosis, or edema; DP/PT/radial pulses 2+ bilaterally MS- no significant deformity or atrophy Skin- warm  and dry, no rash or lesion Psych- euthymic mood, full affect Neuro- strength and sensation are intact  EKG is ordered. Personal review of EKG from today shows NSR at 87 bpm with PVCs  Additional studies reviewed include: EP and HF office notes. Admission notes.   Assessment and Plan:  Persistent AFib / Flutter (most likely atypical) EKG today shows NSR Continue eliquis for CHA2DS2Vasc of at least 4 Converted recently on amiodarone. Decrease to 200 mg daily today.  Would not plan PPM AV node ablation given relatively recent 58mos/p valve repair and MAZE Labs today.  Not currently an ablation candidate. Elevated weight, kidney function, and recent MAZE/valve surgery significantly decrease likelihood of success with elevated risk of complications.  Body mass index is 41.79 kg/m.   Failed tikosyn. No 1c with HF and poor renal function.   2. Chronic systolic CHF Echo 78/34/1962LVEF 40-45% in sinus (Felt to be 30-35% on 7/8 in fib/flutter) HFpEF, diastolic, historically combined Volume status stable on exam today.  Sees CHF team again next month. Continue toprol 25 mg daily Consider SGLT2i Consider hydral/imdur Likely not MRA candidate given renal function. Will looked into [place] [place]. Not sure if she would be a candidate.  3. Valvular heart disease: S/p MV and TV repairs in 5/22.  Recent echo with mild-moderate TR.  There is only mild MR, but mean gradient across the valve is 10 mmHg suggesting mitral stenosis.  However, HR was also around 130 at time, so would favor repeat limited echo for MV assessment when HR is lower/in NSR. - Limited echo showed improved EF 40-45%, MV mean gradient 7 mmHg (HR 82 bpm), moderate TR. Per TCTS. Visit next  month.  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   RTC to see Dr. ARayann Heman3 months. Will need to see how her EF does in NSR for definitive planning.  Failed Tikosyn.   MShirley Friar PA-C  03/16/21 11:38 AM

## 2021-03-17 ENCOUNTER — Ambulatory Visit (INDEPENDENT_AMBULATORY_CARE_PROVIDER_SITE_OTHER): Payer: Medicare Other | Admitting: Student

## 2021-03-17 ENCOUNTER — Other Ambulatory Visit: Payer: Self-pay

## 2021-03-17 ENCOUNTER — Encounter: Payer: Self-pay | Admitting: Student

## 2021-03-17 VITALS — BP 124/72 | HR 87 | Ht 67.0 in | Wt 266.8 lb

## 2021-03-17 DIAGNOSIS — I480 Paroxysmal atrial fibrillation: Secondary | ICD-10-CM

## 2021-03-17 DIAGNOSIS — I4892 Unspecified atrial flutter: Secondary | ICD-10-CM

## 2021-03-17 DIAGNOSIS — I5031 Acute diastolic (congestive) heart failure: Secondary | ICD-10-CM | POA: Diagnosis not present

## 2021-03-17 LAB — COMPREHENSIVE METABOLIC PANEL
ALT: 6 IU/L (ref 0–32)
AST: 14 IU/L (ref 0–40)
Albumin/Globulin Ratio: 1.2 (ref 1.2–2.2)
Albumin: 4 g/dL (ref 3.8–4.8)
Alkaline Phosphatase: 90 IU/L (ref 44–121)
BUN/Creatinine Ratio: 9 — ABNORMAL LOW (ref 12–28)
BUN: 21 mg/dL (ref 8–27)
Bilirubin Total: <0.2 mg/dL (ref 0.0–1.2)
CO2: 28 mmol/L (ref 20–29)
Calcium: 9.9 mg/dL (ref 8.7–10.3)
Chloride: 99 mmol/L (ref 96–106)
Creatinine, Ser: 2.4 mg/dL — ABNORMAL HIGH (ref 0.57–1.00)
Globulin, Total: 3.4 g/dL (ref 1.5–4.5)
Glucose: 68 mg/dL (ref 65–99)
Potassium: 3.6 mmol/L (ref 3.5–5.2)
Sodium: 143 mmol/L (ref 134–144)
Total Protein: 7.4 g/dL (ref 6.0–8.5)
eGFR: 22 mL/min/{1.73_m2} — ABNORMAL LOW (ref >59)

## 2021-03-17 LAB — CBC
Hematocrit: 37.7 % (ref 34.0–46.6)
Hemoglobin: 11.9 g/dL (ref 11.1–15.9)
MCH: 26.7 pg (ref 26.6–33.0)
MCHC: 31.6 g/dL (ref 31.5–35.7)
MCV: 85 fL (ref 79–97)
Platelets: 283 10*3/uL (ref 150–450)
RBC: 4.46 x10E6/uL (ref 3.77–5.28)
RDW: 14.7 % (ref 11.7–15.4)
WBC: 6.5 10*3/uL (ref 3.4–10.8)

## 2021-03-17 MED ORDER — AMIODARONE HCL 200 MG PO TABS: 200.0000 mg | ORAL_TABLET | Freq: Every day | ORAL | 3 refills | Status: DC

## 2021-03-17 NOTE — Patient Instructions (Signed)
Medication Instructions:  Your physician has recommended you make the following change in your medication:   DECREASE: Amiodarone to 200mg  daily  *If you need a refill on your cardiac medications before your next appointment, please call your pharmacy*   Lab Work: TODAY: CMET, CBC  If you have labs (blood work) drawn today and your tests are completely normal, you will receive your results only by: [place] (if you have MyChart) OR A paper copy in the mail If you have any lab test that is abnormal or we need to change your treatment, we will call you to review the results.  Follow-Up: At [facility], you and your health needs are our priority.  As part of our continuing mission to provide you with exceptional heart care, we have created designated Provider Care Teams.  These Care Teams include your primary Cardiologist (physician) and Advanced Practice Providers (APPs -  Physician Assistants and Nurse Practitioners) who all work together to provide you with the care you need, when you need it.   Your next appointment:   3 month(s)  The format for your next appointment:   In Person  Provider:   You may see Thompson Grayer, MD or one of the following Advanced Practice Providers on your designated Care Team:   Tommye Standard, [place] Legrand Como "[facility]" [place], [place]

## 2021-03-17 NOTE — Telephone Encounter (Signed)
Spoke with Moshe Salisbury gave verbal orders for evaluation treat PT/OT/ST and follow closely with cardiology for any additional restrictions.

## 2021-03-18 ENCOUNTER — Telehealth (HOSPITAL_COMMUNITY): Payer: Self-pay

## 2021-03-18 NOTE — Telephone Encounter (Signed)
Successful telephone encounter to [facility] to review health history prior to cardiac rehab orientation appointment 03/22/21@9 :30 am. All questions answered. Provided patient with CR contact information. Covid screening negative.

## 2021-03-18 NOTE — Telephone Encounter (Signed)
Called patient to see if she was interested in participating in the Cardiac Rehab Program. Patient stated yes. Patient will come in for orientation on 03/22/2021@9 :30am and will attend the 9:15am exercise class.   Tourist information centre manager.

## 2021-03-21 ENCOUNTER — Telehealth (INDEPENDENT_AMBULATORY_CARE_PROVIDER_SITE_OTHER): Payer: Self-pay | Admitting: Primary Care

## 2021-03-21 ENCOUNTER — Ambulatory Visit (INDEPENDENT_AMBULATORY_CARE_PROVIDER_SITE_OTHER): Payer: Medicare Other | Admitting: Primary Care

## 2021-03-21 ENCOUNTER — Encounter (INDEPENDENT_AMBULATORY_CARE_PROVIDER_SITE_OTHER): Payer: Self-pay | Admitting: Primary Care

## 2021-03-21 ENCOUNTER — Other Ambulatory Visit: Payer: Self-pay

## 2021-03-21 VITALS — BP 126/77 | HR 88 | Resp 16 | Wt 265.4 lb

## 2021-03-21 DIAGNOSIS — E1122 Type 2 diabetes mellitus with diabetic chronic kidney disease: Secondary | ICD-10-CM

## 2021-03-21 DIAGNOSIS — N183 Chronic kidney disease, stage 3 unspecified: Secondary | ICD-10-CM

## 2021-03-21 DIAGNOSIS — Z794 Long term (current) use of insulin: Secondary | ICD-10-CM

## 2021-03-21 DIAGNOSIS — Z760 Encounter for issue of repeat prescription: Secondary | ICD-10-CM

## 2021-03-21 DIAGNOSIS — R112 Nausea with vomiting, unspecified: Secondary | ICD-10-CM

## 2021-03-21 DIAGNOSIS — F32A Depression, unspecified: Secondary | ICD-10-CM

## 2021-03-21 DIAGNOSIS — E1142 Type 2 diabetes mellitus with diabetic polyneuropathy: Secondary | ICD-10-CM | POA: Diagnosis not present

## 2021-03-21 LAB — GLUCOSE, POCT (MANUAL RESULT ENTRY): POC Glucose: 102 mg/dl — AB (ref 70–99)

## 2021-03-21 MED ORDER — DULOXETINE HCL 60 MG PO CPEP: 60.0000 mg | ORAL_CAPSULE | Freq: Every day | ORAL | 3 refills | Status: DC

## 2021-03-21 MED ORDER — INSULIN DETEMIR 100 UNIT/ML ~~LOC~~ SOLN: SUBCUTANEOUS | 1 refills | Status: DC

## 2021-03-21 MED ORDER — ONDANSETRON 4 MG PO TBDP
4.0000 mg | ORAL_TABLET | Freq: Three times a day (TID) | ORAL | 0 refills | Status: DC | PRN
Start: 2021-03-21 — End: 2021-07-29

## 2021-03-21 MED ORDER — TRULICITY 1.5 MG/0.5ML ~~LOC~~ SOAJ: 1.5000 mg | SUBCUTANEOUS | 3 refills | Status: DC

## 2021-03-21 NOTE — Progress Notes (Signed)
[facility]   Subjective:    [name] is a 65 y.o. female presents for hospital follow up and establish care. Admit date to the hospital was 02/25/21, patient was discharged from the hospital on 02/28/21, patient was admitted for: Acute on chronic systolic heart failure ([place].). She denies chest pain and shortness of breaths.  She has voiced concerns about nausea,  headache lower extremity swelling.    Past Medical History:  Diagnosis Date   Allergic rhinitis    Arthritis    Asthma    Chest pain 12/27/2017   Chronic diastolic CHF (congestive heart failure) (HCC)    COPD (chronic obstructive pulmonary disease) (HCC)    Depression    DM (diabetes mellitus) ([place])    DVT (deep vein thrombosis) in pregnancy    Dysrhythmia    atrial fibrilation   GERD (gastroesophageal reflux disease)    Headache(784.0)    HTN (hypertension)    Hyperlipidemia    Obesity    Pneumonia 04/2018   RIGHT LOBE   Shortness of breath    Sleep apnea    compliant with CPAP     Allergies  Allergen Reactions   Sulfa Antibiotics Itching      Current Outpatient Medications on File Prior to Visit  Medication Sig Dispense Refill   Accu-Chek Softclix Lancets lancets Use as instructed to check blood sugar three times daily. E11.69 100 each 12   albuterol (VENTOLIN HFA) 108 (90 Base) MCG/ACT inhaler Inhale 2 puffs into the lungs every 4 (four) hours as needed for wheezing or shortness of breath.      Alcohol Swabs (B-D SINGLE USE SWABS REGULAR) PADS 1 each by Other route daily.     ALPRAZolam (XANAX) 0.5 MG tablet Take 1 tablet (0.5 mg total) by mouth 2 (two) times daily as needed for anxiety. 30 tablet 0   amiodarone (PACERONE) 200 MG tablet Take 1 tablet (200 mg total) by mouth daily. 180 tablet 3   apixaban (ELIQUIS) 5 MG TABS tablet Take 1 tablet (5 mg total) by mouth 2 (two) times daily. 180 tablet 1   B-D ULTRAFINE III SHORT PEN 31G X 8 MM MISC 1 each by  Other route in the morning, at noon, in the evening, and at bedtime.     blood glucose meter kit and supplies KIT Dispense based on patient and insurance preference. Use up to four times daily as directed. 1 each 0   Blood Glucose Monitoring Suppl (ACCU-CHEK GUIDE ME) w/Device KIT Use as instructed to check blood sugar three times daily. E11.69 1 kit 0   Blood Glucose Monitoring Suppl (ONETOUCH VERIO) w/Device KIT Use to check blood sugar TID. E11.69 1 kit 0   colchicine 0.6 MG tablet Take 0.5 tablets (0.3 mg total) by mouth daily. 90 tablet 0   docusate sodium (COLACE) 100 MG capsule Take 100 mg by mouth 2 (two) times daily.     DULoxetine (CYMBALTA) 60 MG capsule Take 1 capsule (60 mg total) by mouth daily. 90 capsule 3   EPINEPHrine 0.3 mg/0.3 mL IJ SOAJ injection Inject 0.3 mg into the muscle as needed for anaphylaxis. 1 each 1   glucose blood (ONETOUCH VERIO) test strip Use to check blood sugar TID. E11.69 100 each 6   insulin aspart (NOVOLOG) 100 UNIT/ML injection Inject 12 Units into the skin 3 (three) times daily before meals. For blood sugars 0-199 give 0 units of insulin, 201-250 give 4 units, 251-300 give 6 units, 301-350 give 8  units, 351-400 give 10 units,> 400 give 12 units and call M.D. 10 mL 3   LEVEMIR 100 UNIT/ML injection INJECT 0.4 MLS (40 UNITS TOTAL) INTO THE SKIN 2 (TWO) TIMES DAILY. 60 mL 1   metoprolol succinate (TOPROL XL) 25 MG 24 hr tablet Take 1 tablet (25 mg total) by mouth daily. 30 tablet 11   Multiple Vitamins-Minerals (MULTIVITAMIN WOMEN PO) Take 1 tablet by mouth daily.     nitroGLYCERIN (NITROSTAT) 0.4 MG SL tablet Place 1 tablet (0.4 mg total) under the tongue every 5 (five) minutes x 3 doses as needed for chest pain. 25 tablet 12   OneTouch Delica Lancets 95M MISC Use to check blood sugar TID.  E11.69 100 each 6   pantoprazole (PROTONIX) 40 MG tablet Take 40 mg by mouth 2 (two) times daily.     potassium chloride SA (KLOR-CON) 20 MEQ tablet Take 2 tablets (40 mEq  total) by mouth daily. 60 tablet 6   RESTASIS 0.05 % ophthalmic emulsion Place 1 drop into both eyes 2 (two) times daily.  12   rosuvastatin (CRESTOR) 10 MG tablet Take 1 tablet (10 mg total) by mouth daily. 90 tablet 1   torsemide (DEMADEX) 20 MG tablet TAKE 4 TABLETS BY MOUTH DAILY. 841 tablet 1   TRULICITY 1.5 LK/4.4WN SOPN INJECT 1.5 MG INTO THE SKIN ONCE A WEEK. 0.5 mL 1   No current facility-administered medications on file prior to visit.     Review of System: Review of Systems  Cardiovascular:  Positive for leg swelling.  Gastrointestinal:  Positive for nausea.  Neurological:  Positive for headaches.  All other systems reviewed and are negative.  Objective:  BP 126/77 (BP Location: Left Arm, Patient Position: Sitting, Cuff Size: Large)   Pulse 88   Resp 16   Wt 265 lb 6.4 oz (120.4 kg)   SpO2 96%   BMI 41.57 kg/m   Filed Weights   03/21/21 1105  Weight: 265 lb 6.4 oz (120.4 kg)    Physical Exam General Appearance: Well nourished, obese female in no apparent distress. Eyes: PERRLA, EOMs, conjunctiva no swelling or erythema Sinuses: No Frontal/maxillary tenderness ENT/Mouth: Ext aud canals clear, TMs without erythema, bulging. Hearing normal.  Neck: Supple, thyroid normal.  Respiratory: Respiratory effort normal, BS equal bilaterally without rales, rhonchi, wheezing or stridor.  Cardio: RRR with no MRGs. Brisk peripheral pulses without edema.  Abdomen: Soft, + BS.  Non tender, no guarding, rebound, hernias, masses. Lymphatics: Non tender without lymphadenopathy.  Musculoskeletal: Full ROM, 5/5 strength, normal gait.  Skin: Warm, dry without rashes, lesions, ecchymosis.  Neuro: A&O Psych: Awake and oriented X 3, normal affect, Insight and Judgment appropriate.    Assessment:  Susy was seen today for hospitalization follow-up.  Diagnoses and all orders for this visit:  Type 2 diabetes mellitus with stage 3 chronic kidney disease, with long-term current use  of insulin, unspecified whether stage 3a or 3b CKD (HCC) -     POCT glucose (manual entry)  Diabetic polyneuropathy associated with type 2 diabetes mellitus (HCC) -     DULoxetine (CYMBALTA) 60 MG capsule; Take 1 capsule (60 mg total) by mouth daily.  Depression, unspecified depression type -     DULoxetine (CYMBALTA) 60 MG capsule; Take 1 capsule (60 mg total) by mouth daily.  Medication refill -     DULoxetine (CYMBALTA) 60 MG capsule; Take 1 capsule (60 mg total) by mouth daily. -     Dulaglutide (TRULICITY) 1.5 UU/7.2ZD SOPN; Inject  1.5 mg into the skin once a week. (Patient taking differently: Inject 1.5 mg into the skin every Wednesday.)  Nausea and vomiting, intractability of vomiting not specified, unspecified vomiting type -     ondansetron (ZOFRAN ODT) 4 MG disintegrating tablet; Take 1 tablet (4 mg total) by mouth every 8 (eight) hours as needed for nausea or vomiting.  Other orders -     insulin detemir (LEVEMIR) 100 UNIT/ML injection; INJECT 0.4 MLS (40 UNITS TOTAL) INTO THE SKIN 2 (TWO) TIMES DAILY.    This note has been created with Surveyor, quantity. Any transcriptional errors are unintentional.   [name], NP 03/21/2021, 11:24 AM

## 2021-03-21 NOTE — Telephone Encounter (Signed)
Home Health Verbal Orders - Caller/Agency: [facility]  Callback Number: [phone]  Requesting; PT  Frequency: 1w1, 2w3, 1w3, start 03/19/21

## 2021-03-21 NOTE — Patient Instructions (Signed)
Diabetes Mellitus and Exercise Exercising regularly is important for overall health, especially for people who have diabetes mellitus. Exercising is not only about losing weight. It has many other health benefits, such as increasing muscle strength and bone density and reducing body fat and stress. This leads to improved fitness, flexibility, and endurance, all of which result in better overall health. What are the benefits of exercise if I have diabetes? Exercise has many benefits for people with diabetes. They include: Helping to lower and control blood sugar (glucose). Helping the body to respond better to the hormone insulin by improving insulin sensitivity. Reducing how much insulin the body needs. Lowering the risk for heart disease by: Lowering "bad" cholesterol and triglyceride levels. Increasing "good" cholesterol levels. Lowering blood pressure. Lowering blood glucose levels. What is my activity plan? Your health care provider or certified diabetes educator can help you make a plan for the type and frequency of exercise that works for you. This is called your activity plan. Be sure to: Get at least 150 minutes of medium-intensity or high-intensity exercise each week. Exercises may include brisk walking, biking, or water aerobics. Do stretching and strengthening exercises, such as yoga or weight lifting, at least 2 times a week. Spread out your activity over at least 3 days of the week. Get some form of physical activity each day. Do not go more than 2 days in a row without some kind of physical activity. Avoid being inactive for more than 90 minutes at a time. Take frequent breaks to walk or stretch. Choose exercises or activities that you enjoy. Set realistic goals. Start slowly and gradually increase your exercise intensity over time. How do I manage my diabetes during exercise? Monitor your blood glucose Check your blood glucose before and after exercising. If your blood glucose  is: 240 mg/dL (13.3 mmol/L) or higher before you exercise, check your urine for ketones. These are chemicals created by the liver. If you have ketones in your urine, do not exercise until your blood glucose returns to normal. 100 mg/dL (5.6 mmol/L) or lower, eat a snack containing 15-20 grams of carbohydrate. Check your blood glucose 15 minutes after the snack to make sure that your glucose level is above 100 mg/dL (5.6 mmol/L) before you start your exercise. Know the symptoms of low blood glucose (hypoglycemia) and how to treat it. Your risk for hypoglycemia increases during and after exercise. Follow these tips and your health care provider's instructions Keep a carbohydrate snack that is fast-acting for use before, during, and after exercise to help prevent or treat hypoglycemia. Avoid injecting insulin into areas of the body that are going to be exercised. For example, avoid injecting insulin into: Your arms, when you are about to play tennis. Your legs, when you are about to go jogging. Keep records of your exercise habits. Doing this can help you and your health care provider adjust your diabetes management plan as needed. Write down: Food that you eat before and after you exercise. Blood glucose levels before and after you exercise. The type and amount of exercise you have done. Work with your health care provider when you start a new exercise or activity. He or she may need to: Make sure that the activity is safe for you. Adjust your insulin, other medicines, and food that you eat. Drink plenty of water while you exercise. This prevents loss of water (dehydration) and problems caused by a lot of heat in the body (heat stroke). Where to find more information   American Diabetes Association: www.diabetes.org Summary Exercising regularly is important for overall health, especially for people who have diabetes mellitus. Exercising has many health benefits. It increases muscle strength and bone  density and reduces body fat and stress. It also lowers and controls blood glucose. Your health care provider or certified diabetes educator can help you make an activity plan for the type and frequency of exercise that works for you. Work with your health care provider to make sure any new activity is safe for you. Also work with your health care provider to adjust your insulin, other medicines, and the food you eat. This information is not intended to replace advice given to you by your health care provider. Make sure you discuss any questions you have with your health care provider. Document Revised: 05/05/2019 Document Reviewed: 05/05/2019 Elsevier Patient Education  2022 Elsevier Inc.  

## 2021-03-22 ENCOUNTER — Telehealth (HOSPITAL_COMMUNITY): Payer: Self-pay | Admitting: *Deleted

## 2021-03-22 ENCOUNTER — Inpatient Hospital Stay (HOSPITAL_COMMUNITY): Admission: RE | Admit: 2021-03-22 | Payer: Medicare Other | Source: Ambulatory Visit

## 2021-03-22 NOTE — Telephone Encounter (Signed)
Spoke with [name]. Reported that she is not feeling well. Patient had an orientation appointment scheduled today at 0930. Did not show up for the appointment.Barnet Pall, RN,BSN 03/22/2021 10:17 AM

## 2021-03-22 NOTE — Telephone Encounter (Signed)
Called Mark at [phone] VO given.

## 2021-03-23 ENCOUNTER — Telehealth (INDEPENDENT_AMBULATORY_CARE_PROVIDER_SITE_OTHER): Payer: Self-pay

## 2021-03-23 NOTE — Telephone Encounter (Signed)
Spoke with Saralyn Pilar at [employer] and states faxed was not legible and cut off . Not aware of this until now . He will refax form

## 2021-03-23 NOTE — Telephone Encounter (Signed)
Copied from [place] [id number]. Topic: General - Other >> Mar 23, 2021 11:04 AM Celene Kras wrote: Reason for CRM: Bridgette, from the diabetes store, calling stating that she is needing to follow up on the prescription for the pts diabetic shoes. She states that her last call was 02/17/21 and still has not received a response. Please advise.

## 2021-03-28 ENCOUNTER — Other Ambulatory Visit: Payer: Self-pay | Admitting: Thoracic Surgery (Cardiothoracic Vascular Surgery)

## 2021-03-28 ENCOUNTER — Ambulatory Visit (HOSPITAL_COMMUNITY): Payer: Medicare Other

## 2021-03-28 DIAGNOSIS — Z9889 Other specified postprocedural states: Secondary | ICD-10-CM

## 2021-03-29 ENCOUNTER — Other Ambulatory Visit: Payer: Self-pay

## 2021-03-29 ENCOUNTER — Ambulatory Visit (INDEPENDENT_AMBULATORY_CARE_PROVIDER_SITE_OTHER): Payer: Self-pay | Admitting: Physician Assistant

## 2021-03-29 ENCOUNTER — Ambulatory Visit
Admission: RE | Admit: 2021-03-29 | Discharge: 2021-03-29 | Disposition: A | Discharge status: Home / Self Care | Payer: Medicare Other | Source: Ambulatory Visit | Attending: Thoracic Surgery (Cardiothoracic Vascular Surgery) | Admitting: Thoracic Surgery (Cardiothoracic Vascular Surgery)

## 2021-03-29 VITALS — BP 135/83 | HR 90 | Resp 20 | Ht 67.0 in | Wt 269.0 lb

## 2021-03-29 DIAGNOSIS — Z8679 Personal history of other diseases of the circulatory system: Secondary | ICD-10-CM

## 2021-03-29 DIAGNOSIS — Z9889 Other specified postprocedural states: Secondary | ICD-10-CM

## 2021-03-29 NOTE — Patient Instructions (Signed)
Endocarditis is a potentially serious infection of heart valves or inside lining of the heart.  It occurs more commonly in patients with diseased heart valves (such as patient's with aortic or mitral valve disease) and in patients who have undergone heart valve repair or replacement.  Certain surgical and dental procedures may put you at risk, such as dental cleaning, other dental procedures, or any surgery involving the respiratory, urinary, gastrointestinal tract, gallbladder or prostate gland.   To minimize your chances for develooping endocarditis, maintain good oral health and seek prompt medical attention for any infections involving the mouth, teeth, gums, skin or urinary tract.    Always notify your doctor or dentist about your underlying heart valve condition before having any invasive procedures. You will need to take antibiotics before certain procedures, including all routine dental cleanings or other dental procedures.  Your cardiologist or dentist should prescribe these antibiotics for you to be taken ahead of time.  You may return to driving an automobile as long as you are no longer requiring oral narcotic pain relievers during the daytime.  It would be wise to start driving only short distances during the daylight and gradually increase from there as you feel comfortable.  Make every effort to maintain a "heart-healthy" lifestyle with regular physical exercise and adherence to a low-fat, low-carbohydrate diet.  Continue to seek regular follow-up appointments with your primary care physician and/or cardiologist.  You may continue to gradually increase your physical activity as tolerated.  Refrain from any heavy lifting or strenuous use of your arms and shoulders until at least 8 weeks from the time of your surgery, and avoid activities that cause increased pain in your chest on the side of your surgical incision.  Otherwise you may continue to increase activities without any particular  limitations.  Increase the intensity and duration of physical activity gradually.          

## 2021-03-29 NOTE — Progress Notes (Signed)
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   The patient's early postoperative recovery while in the hospital was notable for AKI with creatinine peaking at 4.36.  She required aggressive treatment with Milrinone therapy.  She had post operative Atrial fibrillation.  She was admitted in June post operatively for acute exacerbation of CHF.  In July she was again admitted and underwent Cardioversion and management of CHF.  She presents today for post operative follow up.  Overall she is doing very well.  She states that her incisions have healed without evidence of infection.  She is up and ambulating throughout the day without difficulty.  Her weight is currently at baseline and she is following routinely with Dr. Sung Amabile.  She is going to participate with Cardiac rehab, but needs to delay as her niece is currently dying from breast cancer and was just taken to the hospital this morning.  Current Outpatient Medications  Medication Sig Dispense Refill   Accu-Chek Softclix Lancets lancets Use as instructed to check blood sugar three times daily. E11.69 100 each 12   albuterol (VENTOLIN HFA) 108 (90 Base) MCG/ACT inhaler Inhale 2 puffs into the lungs every 4 (four) hours as needed for wheezing or shortness of breath.      Alcohol Swabs (B-D SINGLE USE SWABS REGULAR) PADS 1 each by Other route daily.     ALPRAZolam (XANAX) 0.5 MG tablet Take 1 tablet (0.5 mg total) by mouth 2 (two) times daily as needed for anxiety. 30 tablet 0   amiodarone (PACERONE) 200 MG tablet Take 1 tablet (200 mg total) by mouth daily. 180 tablet 3   apixaban (ELIQUIS) 5 MG TABS tablet Take 1 tablet (5 mg total) by mouth 2 (two) times daily. 180 tablet 1   B-D ULTRAFINE III SHORT PEN 31G X 8 MM MISC 1 each by Other route in the  morning, at noon, in the evening, and at bedtime.     blood glucose meter kit and supplies KIT Dispense based on patient and insurance preference. Use up to four times daily as directed. 1 each 0   Blood Glucose Monitoring Suppl (ACCU-CHEK GUIDE ME) w/Device KIT Use as instructed to check blood sugar three times daily. E11.69 1 kit 0   Blood Glucose Monitoring Suppl (ONETOUCH VERIO) w/Device KIT Use to check blood sugar TID. E11.69 1 kit 0   colchicine 0.6 MG tablet Take 0.5 tablets (0.3 mg total) by mouth daily. 90 tablet 0   docusate sodium (COLACE) 100 MG capsule Take 100 mg by mouth 2 (two) times daily.     Dulaglutide (TRULICITY) 1.5 VV/6.1QA SOPN Inject 1.5 mg into the skin once a week. (Patient taking differently: Inject 1.5 mg into the skin every Wednesday.) 0.5 mL 3   DULoxetine (CYMBALTA) 60 MG capsule Take 1 capsule (60 mg total) by mouth daily. 90 capsule 3   EPINEPHrine 0.3 mg/0.3 mL IJ SOAJ injection Inject 0.3 mg into the muscle as needed for anaphylaxis. 1 each 1   glucose blood (ONETOUCH VERIO) test strip Use to check blood sugar TID. E11.69 100 each 6   insulin aspart (NOVOLOG) 100 UNIT/ML injection Inject 12 Units into the skin 3 (three) times daily before meals. For blood sugars 0-199 give 0 units of insulin, 201-250 give 4 units, 251-300 give 6 units, 301-350  give 8 units, 351-400 give 10 units,> 400 give 12 units and call M.D. (Patient taking differently: Inject 0-12 Units into the skin 3 (three) times daily before meals. For blood sugars 0-199 give 0 units of insulin, 201-250 give 4 units, 251-300 give 6 units, 301-350 give 8 units, 351-400 give 10 units,> 400 give 12 units and call M.D.) 10 mL 3   insulin detemir (LEVEMIR) 100 UNIT/ML injection INJECT 0.4 MLS (40 UNITS TOTAL) INTO THE SKIN 2 (TWO) TIMES DAILY. 60 mL 1   metoprolol succinate (TOPROL XL) 25 MG 24 hr tablet Take 1 tablet (25 mg total) by mouth daily. 30 tablet 11   Multiple Vitamins-Minerals (MULTIVITAMIN WOMEN PO)  Take 1 tablet by mouth daily.     nitroGLYCERIN (NITROSTAT) 0.4 MG SL tablet Place 1 tablet (0.4 mg total) under the tongue every 5 (five) minutes x 3 doses as needed for chest pain. 25 tablet 12   ondansetron (ZOFRAN ODT) 4 MG disintegrating tablet Take 1 tablet (4 mg total) by mouth every 8 (eight) hours as needed for nausea or vomiting. 20 tablet 0   OneTouch Delica Lancets 25K MISC Use to check blood sugar TID.  E11.69 100 each 6   pantoprazole (PROTONIX) 40 MG tablet Take 40 mg by mouth 2 (two) times daily.     potassium chloride SA (KLOR-CON) 20 MEQ tablet Take 2 tablets (40 mEq total) by mouth daily. 60 tablet 6   RESTASIS 0.05 % ophthalmic emulsion Place 1 drop into both eyes 2 (two) times daily.  12   rosuvastatin (CRESTOR) 10 MG tablet Take 1 tablet (10 mg total) by mouth daily. 90 tablet 1   torsemide (DEMADEX) 20 MG tablet TAKE 4 TABLETS BY MOUTH DAILY. (Patient taking differently: Take 80 mg by mouth daily.) 360 tablet 1   No current facility-administered medications for this visit.    Physical Exam  BP 135/83 (BP Location: Left Arm, Patient Position: Sitting)   Pulse 90   Resp 20   Ht 5' 7" (1.702 m)   Wt 269 lb (122 kg)   SpO2 96% Comment: RA  BMI 42.13 kg/m   Gen: no apparent distress Heart: RRR Lungs: CTA bilaterally Ext: no edema present Incisions: well healed, mild keloid formation  Diagnostic Tests:  CXR: no pleural effusions, mild cardiomegaly, sternal wires intact  ECHO- EF40-45%, mod TR  A/P;  Ms. Moccio is S/P MV Repair, TV Repair, MAZE procedure with cardioversion in July.  She is doing very well.  She is 3 months after from surgery and is back to her normal state of health.  She plans to start cardiac rehab in the next few weeks.  She was provided instructions on Endocarditis prophylaxis and instructed to increase activity as tolerated.  She will continue to follow up with Cardiology as scheduled.  She can return to our office on PRN basis.   Ellwood Handler, PA-C [facility] [phone]

## 2021-03-30 ENCOUNTER — Ambulatory Visit (HOSPITAL_COMMUNITY): Payer: Medicare Other

## 2021-03-31 ENCOUNTER — Telehealth (HOSPITAL_COMMUNITY): Payer: Self-pay | Admitting: *Deleted

## 2021-03-31 ENCOUNTER — Ambulatory Visit (HOSPITAL_COMMUNITY): Payer: Medicare Other

## 2021-03-31 NOTE — Telephone Encounter (Signed)
Spoke to [name] her niece passed away last night and she will not be attending cardiac rehab orientation this morning. Will cancel appointments.[name] wants about two weeks before we call her to reschedule. I offered [name] my condolences for her loss.Barnet Pall, RN,BSN 03/31/2021 9:25 AM

## 2021-04-01 ENCOUNTER — Ambulatory Visit (HOSPITAL_COMMUNITY): Payer: Medicare Other

## 2021-04-01 ENCOUNTER — Telehealth (INDEPENDENT_AMBULATORY_CARE_PROVIDER_SITE_OTHER): Payer: Self-pay

## 2021-04-01 ENCOUNTER — Other Ambulatory Visit (INDEPENDENT_AMBULATORY_CARE_PROVIDER_SITE_OTHER): Payer: Self-pay | Admitting: Primary Care

## 2021-04-01 ENCOUNTER — Other Ambulatory Visit: Payer: Self-pay | Admitting: Cardiology

## 2021-04-01 DIAGNOSIS — E1169 Type 2 diabetes mellitus with other specified complication: Secondary | ICD-10-CM

## 2021-04-01 DIAGNOSIS — I5041 Acute combined systolic (congestive) and diastolic (congestive) heart failure: Secondary | ICD-10-CM

## 2021-04-01 DIAGNOSIS — E669 Obesity, unspecified: Secondary | ICD-10-CM

## 2021-04-01 NOTE — Telephone Encounter (Signed)
Copied from [place] [id number]. Topic: General - Other >> Mar 31, 2021 12:00 PM Leward Quan A wrote: Reason for CRM: Patient called in asking for Juluis Mire to give her a call back to discuss a death situation Please call Ph# [phone]   Contacted patient. Informed that PCP is not in office. She did not request to speak with anyone else and did not ask for a return when PCP returns to office. Nat Christen, CMA

## 2021-04-01 NOTE — Telephone Encounter (Signed)
   Notes to clinic:  The original prescription was discontinued on 06/24/2020 by [name], NP. Renewing this prescription may not be appropriate   Requested Prescriptions  Pending Prescriptions Disp Refills   NOVOLOG 100 UNIT/ML injection [Pharmacy Med Name: NOVOLOG 100 UNIT/ML VIAL] 10 mL 3    Sig: INJECT 5 UNITS INTO THE SKIN 3 (THREE) TIMES DAILY WITH MEALS.     Endocrinology:  Diabetes - Insulins Passed - 04/01/2021 12:18 PM      Passed - HBA1C is between 0 and 7.9 and within 180 days    HbA1c, POC (controlled diabetic range)  Date Value Ref Range Status  09/24/2020 9.9 (A) 0.0 - 7.0 % Final   Hgb A1c MFr Bld  Date Value Ref Range Status  01/29/2021 7.4 (H) 4.8 - 5.6 % Final    Comment:    (NOTE)         Prediabetes: 5.7 - 6.4         Diabetes: >6.4         Glycemic control for adults with diabetes: <7.0           Passed - Valid encounter within last 6 months    Recent Outpatient Visits           1 week ago Type 2 diabetes mellitus with stage 3 chronic kidney disease, with long-term current use of insulin, unspecified whether stage 3a or 3b CKD ([place])   [place] [facility] Juluis Mire P, NP   1 month ago Type 2 diabetes mellitus with stage 3 chronic kidney disease, with long-term current use of insulin, unspecified whether stage 3a or 3b CKD ([place])   [place], Michelle P, NP   6 months ago Diabetes mellitus type 2 in obese ([place])   [place], Michelle P, NP   9 months ago Type 2 diabetes mellitus without complication, without long-term current use of insulin ([place])   [place], Michelle P, NP   11 months ago Diabetes mellitus type 2 in obese [facility])   [place], RPH-CPP       Future Appointments             In 1 month Oletta Lamas, Milford Cage, NP [place]   In 3  months [place], Jeneen Rinks, MD [place], [facility]

## 2021-04-03 ENCOUNTER — Other Ambulatory Visit: Payer: Self-pay | Admitting: Cardiology

## 2021-04-03 DIAGNOSIS — I5041 Acute combined systolic (congestive) and diastolic (congestive) heart failure: Secondary | ICD-10-CM

## 2021-04-04 ENCOUNTER — Ambulatory Visit (HOSPITAL_COMMUNITY): Payer: Medicare Other

## 2021-04-04 ENCOUNTER — Telehealth (INDEPENDENT_AMBULATORY_CARE_PROVIDER_SITE_OTHER): Payer: Self-pay | Admitting: Primary Care

## 2021-04-04 MED ORDER — HYDROXYZINE HCL 25 MG PO TABS: 25.0000 mg | ORAL_TABLET | Freq: Two times a day (BID) | ORAL | 0 refills | Status: DC | PRN

## 2021-04-04 NOTE — Telephone Encounter (Signed)
Looks like she recently had a visit with PCP hence I have sent a prescription for hydroxyzine to her pharmacy.

## 2021-04-04 NOTE — Telephone Encounter (Signed)
Please advise if something can be sent.

## 2021-04-04 NOTE — Telephone Encounter (Signed)
[name] calling to ask if the pt can be sent in something for anxiety and insomnia. Pt's niece passed away and she is having a  difficult time. Advised [name] pt wil need appt. No answer at the office.  Please call pt to schedule.  Leslie Cb [phone]

## 2021-04-06 ENCOUNTER — Ambulatory Visit (HOSPITAL_COMMUNITY): Payer: Medicare Other

## 2021-04-06 ENCOUNTER — Telehealth (INDEPENDENT_AMBULATORY_CARE_PROVIDER_SITE_OTHER): Payer: Self-pay | Admitting: Primary Care

## 2021-04-06 ENCOUNTER — Ambulatory Visit (INDEPENDENT_AMBULATORY_CARE_PROVIDER_SITE_OTHER): Payer: Self-pay | Admitting: *Deleted

## 2021-04-06 NOTE — Telephone Encounter (Signed)
Left voicemail advising patient to contact cardiology and if unable to reach them present to ED for chest pain.

## 2021-04-06 NOTE — Telephone Encounter (Addendum)
[place] is with patient- patient is complaining of chest pressure and SOB. Patient states she has been under increased stress recently and she has been having some increase in swelling- "puffiness" in the face. Patient has reached out to cardiology- but has not heard back.Patient advised per protocol- ED.

## 2021-04-06 NOTE — Telephone Encounter (Signed)
[name] from [place] called and reported that th pt complained of 8 out of 10 pain in chest / tried contacting heart and vascular center with no success yesterday / pt called twice to see if she was possibly going into AFIB again / please advise

## 2021-04-06 NOTE — Telephone Encounter (Signed)
Reason for Disposition . Difficulty breathing  Answer Assessment - Initial Assessment Questions 1. LOCATION: "Where does it hurt?"       Middle of chest 2. RADIATION: "Does the pain go anywhere else?" (e.g., into neck, jaw, arms, back)     no 3. ONSET: "When did the chest pain begin?" (Minutes, hours or days)      Tuesday 4. PATTERN "Does the pain come and go, or has it been constant since it started?"  "Does it get worse with exertion?"      Comes and goes, worse with activity 5. DURATION: "How long does it last" (e.g., seconds, minutes, hours)     minutes 6. SEVERITY: "How bad is the pain?"  (e.g., Scale 1-10; mild, moderate, or severe)    - MILD (1-3): doesn't interfere with normal activities     - MODERATE (4-7): interferes with normal activities or awakens from sleep    - SEVERE (8-10): excruciating pain, unable to do any normal activities       moderate 7. CARDIAC RISK FACTORS: "Do you have any history of heart problems or risk factors for heart disease?" (e.g., angina, prior heart attack; diabetes, high blood pressure, high cholesterol, smoker, or strong family history of heart disease)     Open heart surgery history 8. PULMONARY RISK FACTORS: "Do you have any history of lung disease?"  (e.g., blood clots in lung, asthma, emphysema, birth control pills)     asthma 9. CAUSE: "What do you think is causing the chest pain?"     Fluid retention 10. OTHER SYMPTOMS: "Do you have any other symptoms?" (e.g., dizziness, nausea, vomiting, sweating, fever, difficulty breathing, cough)       SOB 11. PREGNANCY: "Is there any chance you are pregnant?" "When was your last menstrual period?"       N/a  Protocols used: Chest Pain-A-AH

## 2021-04-08 ENCOUNTER — Other Ambulatory Visit: Payer: Self-pay | Admitting: Cardiology

## 2021-04-08 ENCOUNTER — Encounter (HOSPITAL_COMMUNITY): Payer: Medicare Other

## 2021-04-11 ENCOUNTER — Inpatient Hospital Stay (INDEPENDENT_AMBULATORY_CARE_PROVIDER_SITE_OTHER): Payer: Medicare Other | Admitting: Primary Care

## 2021-04-13 ENCOUNTER — Ambulatory Visit (HOSPITAL_COMMUNITY): Payer: Medicare Other

## 2021-04-13 ENCOUNTER — Telehealth: Payer: Self-pay | Admitting: Cardiology

## 2021-04-13 NOTE — Telephone Encounter (Signed)
Patient c/o Palpitations:  High priority if patient c/o lightheadedness, shortness of breath, or chest pain  How long have you had palpitations/irregular HR/ Afib? Are you having the symptoms now? Since  04/07/21  Are you currently experiencing lightheadedness, SOB or CP? No not now, Pt has oxygen to use AS NEEDED  Do you have a history of afib (atrial fibrillation) or irregular heart rhythm? Yes  Have you checked your BP or HR? (document readings if available):  102/60 HR 52  Are you experiencing any other symptoms? Pt only has SOB with activity when she gets up  [facility] with [facility] is calling with this information [phone]

## 2021-04-13 NOTE — Telephone Encounter (Signed)
Spoke with pt who states she believes she is back in AF. She does not feel well and is having SOB. She denies CP, cough. She states she believes she has gained "a pound or two."   Pt agrees to be seen tomorrow by [name], PA.

## 2021-04-14 ENCOUNTER — Ambulatory Visit (INDEPENDENT_AMBULATORY_CARE_PROVIDER_SITE_OTHER): Payer: Medicare Other | Admitting: Student

## 2021-04-14 ENCOUNTER — Encounter: Payer: Self-pay | Admitting: Student

## 2021-04-14 ENCOUNTER — Other Ambulatory Visit: Payer: Self-pay

## 2021-04-14 VITALS — BP 167/86 | HR 107 | Ht 67.0 in | Wt 273.0 lb

## 2021-04-14 DIAGNOSIS — N1832 Chronic kidney disease, stage 3b: Secondary | ICD-10-CM

## 2021-04-14 DIAGNOSIS — I5031 Acute diastolic (congestive) heart failure: Secondary | ICD-10-CM | POA: Diagnosis not present

## 2021-04-14 DIAGNOSIS — I480 Paroxysmal atrial fibrillation: Secondary | ICD-10-CM

## 2021-04-14 LAB — BASIC METABOLIC PANEL
BUN/Creatinine Ratio: 9 — ABNORMAL LOW (ref 12–28)
BUN: 19 mg/dL (ref 8–27)
CO2: 28 mmol/L (ref 20–29)
Calcium: 9.7 mg/dL (ref 8.7–10.3)
Chloride: 100 mmol/L (ref 96–106)
Creatinine, Ser: 2.1 mg/dL — ABNORMAL HIGH (ref 0.57–1.00)
Glucose: 94 mg/dL (ref 65–99)
Potassium: 3.5 mmol/L (ref 3.5–5.2)
Sodium: 144 mmol/L (ref 134–144)
eGFR: 26 mL/min/{1.73_m2} — ABNORMAL LOW (ref >59)

## 2021-04-14 LAB — CBC
Hematocrit: 32.5 % — ABNORMAL LOW (ref 34.0–46.6)
Hemoglobin: 10.5 g/dL — ABNORMAL LOW (ref 11.1–15.9)
MCH: 27.5 pg (ref 26.6–33.0)
MCHC: 32.3 g/dL (ref 31.5–35.7)
MCV: 85 fL (ref 79–97)
Platelets: 216 10*3/uL (ref 150–450)
RBC: 3.82 x10E6/uL (ref 3.77–5.28)
RDW: 15.3 % (ref 11.7–15.4)
WBC: 6.2 10*3/uL (ref 3.4–10.8)

## 2021-04-14 MED ORDER — AMIODARONE HCL 200 MG PO TABS: 200.0000 mg | ORAL_TABLET | Freq: Two times a day (BID) | ORAL | 3 refills | Status: DC

## 2021-04-14 NOTE — Patient Instructions (Addendum)
You are scheduled for a  Cardioversion on 04/19/2021 with Dr. Harrington Challenger.  Please arrive at the [facility] (Main Entrance A) at [facility]: [address] [place], [place] [address] at 8:30 am.   DIET: Nothing to eat or drink after midnight except a sip of water with medications (see medication instructions below)  FYI: For your safety, and to allow [place] to monitor your vital signs accurately during the surgery/procedure we request that   if you have artificial nails, gel coating, SNS etc. Please have those removed prior to your surgery/procedure. Not having the nail coverings /polish removed may result in cancellation or delay of your surgery/procedure.   Medication Instructions: Only take 1/2 dose of your Insulin the night before and hold the morning of your procedure.   Continue your anticoagulant: Eliquis You will need to continue your anticoagulant after your procedure until you  are told by your  Provider that it is safe to stop   Labs: TODAY: BMET, CBC  You must have a responsible person to drive you home and stay in the waiting area during your procedure. Failure to do so could result in cancellation.  Bring your insurance cards.  *Special Note: Every effort is made to have your procedure done on time. Occasionally there are emergencies that occur at the hospital that may cause delays. Please be patient if a delay does occur.    ------------------------------------------------------------------------------------------------------------  AFIB CLINIC INFORMATION: Your appointment is scheduled on: 04/26/2021 at 9:00am. Please arrive 15 minutes early for check-in. The AFib Clinic is located in the Heart and Vascular Specialty Clinics at [facility] Parking instructions/directions: [address] (off [address]). When you pull in to Entrance C, there is an underground parking garage to your right. The code to enter the garage is 4455. Take the elevators to the first floor.  Follow the signs to the Heart and Vascular Specialty Clinics. You will see registration at the end of the hallway.  Phone number: [phone]

## 2021-04-14 NOTE — Progress Notes (Signed)
PCP:  [name], NP Primary Cardiologist: Buford Dresser, MD Electrophysiologist: Thompson Grayer, MD   [name] is a 65 y.o. female seen today for Thompson Grayer, MD for acute visit due to recurrent AF .    Admitted 7/8 - 7/11 with aflutter and systolic CHF. Diuresed and she chemically converted to NSR on IV amio. EP consulted for recurrent A flutter. She is felt to not currently be a candidate for PPM AV node ablation given recent MAZE.  Dr Quentin Ore recommended  repeat ECHO in 6 months to come up with definitive plan.   Seen in clinic end of July and was in NSR and overall doing well. Amio decreased. Denies lightheadedness,syncope, or near syncope. Edema remains well controlled. No further fatgue or leg cramps at this time. Is anxious about the idea of a repeat [facility] and asks again about possibility of PPM.   Past Medical History:  Diagnosis Date   Allergic rhinitis    Arthritis    Asthma    Chest pain 12/27/2017   Chronic diastolic CHF (congestive heart failure) (HCC)    COPD (chronic obstructive pulmonary disease) (HCC)    Depression    DM (diabetes mellitus) ([place])    DVT (deep vein thrombosis) in pregnancy    Dysrhythmia    atrial fibrilation   GERD (gastroesophageal reflux disease)    Headache(784.0)    HTN (hypertension)    Hyperlipidemia    Obesity    Pneumonia 04/2018   RIGHT LOBE   Shortness of breath    Sleep apnea    compliant with CPAP   Past Surgical History:  Procedure Laterality Date   ABDOMINAL HYSTERECTOMY     BUBBLE STUDY  11/26/2020   Procedure: BUBBLE STUDY;  Surgeon: Buford Dresser, MD;  Location: [place];  Service: Cardiovascular;;   CARDIOVERSION N/A 05/06/2020   Procedure: CARDIOVERSION;  Surgeon: Buford Dresser, MD;  Location: [facility] ENDOSCOPY;  Service: Cardiovascular;  Laterality: N/A;   CARDIOVERSION N/A 11/04/2020   Procedure: CARDIOVERSION;  Surgeon: Lelon Perla, MD;  Location: [place];  Service:  Cardiovascular;  Laterality: N/A;   CARDIOVERSION N/A 02/01/2021   Procedure: CARDIOVERSION;  Surgeon: Jolaine Artist, MD;  Location: [place];  Service: Cardiovascular;  Laterality: N/A;   CARDIOVERSION N/A 02/09/2021   Procedure: CARDIOVERSION;  Surgeon: Jolaine Artist, MD;  Location: [place];  Service: Cardiovascular;  Laterality: N/A;   CATARACT EXTRACTION, BILATERAL     CHOLECYSTECTOMY     COLONOSCOPY WITH PROPOFOL N/A 03/05/2015   Procedure: COLONOSCOPY WITH PROPOFOL;  Surgeon: Carol Ada, MD;  Location: [facility] ENDOSCOPY;  Service: Endoscopy;  Laterality: N/A;   DIAGNOSTIC LAPAROSCOPY     ingrown hallux Left    KNEE SURGERY     LEFT HEART CATH AND CORONARY ANGIOGRAPHY N/A 12/31/2017   Procedure: LEFT HEART CATH AND CORONARY ANGIOGRAPHY;  Surgeon: Charolette Forward, MD;  Location: [place] [facility];  Service: Cardiovascular;  Laterality: N/A;   MAZE N/A 12/29/2020   Procedure: MAZE;  Surgeon: [name], MD;  Location: [place];  Service: Open Heart Surgery;  Laterality: N/A;   MITRAL VALVE REPAIR N/A 12/29/2020   Procedure: MITRAL VALVE REPAIR USING CARBOMEDICS ANNULOFLEX RING SIZE 30;  Surgeon: [name], MD;  Location: [place];  Service: Open Heart Surgery;  Laterality: N/A;   RIGHT HEART CATH N/A 12/22/2020   Procedure: RIGHT HEART CATH;  Surgeon: Larey Dresser, MD;  Location: [place] CV [facility];  Service: Cardiovascular;  Laterality: N/A;  RIGHT HEART CATH N/A 01/31/2021   Procedure: RIGHT HEART CATH;  Surgeon: Jolaine Artist, MD;  Location: [place] [facility];  Service: Cardiovascular;  Laterality: N/A;   TEE WITHOUT CARDIOVERSION N/A 11/26/2020   Procedure: TRANSESOPHAGEAL ECHOCARDIOGRAM (TEE);  Surgeon: Buford Dresser, MD;  Location: [place] ENDOSCOPY;  Service: Cardiovascular;  Laterality: N/A;   TEE WITHOUT CARDIOVERSION N/A 12/29/2020   Procedure: TRANSESOPHAGEAL ECHOCARDIOGRAM (TEE);  Surgeon: [name], MD;  Location: [place];   Service: Open Heart Surgery;  Laterality: N/A;   TRICUSPID VALVE REPLACEMENT N/A 12/29/2020   Procedure: 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 RING SIZE 34;  Surgeon: [name], MD;  Location: [place];  Service: Open Heart Surgery;  Laterality: N/A;    Current Outpatient Medications  Medication Sig Dispense Refill   Accu-Chek Softclix Lancets lancets Use as instructed to check blood sugar three times daily. E11.69 100 each 12   albuterol (VENTOLIN HFA) 108 (90 Base) MCG/ACT inhaler Inhale 2 puffs into the lungs every 4 (four) hours as needed for wheezing or shortness of breath.      Alcohol Swabs (B-D SINGLE USE SWABS REGULAR) PADS 1 each by Other route daily.     ALPRAZolam (XANAX) 0.5 MG tablet Take 1 tablet (0.5 mg total) by mouth 2 (two) times daily as needed for anxiety. 30 tablet 0   amiodarone (PACERONE) 200 MG tablet Take 1 tablet (200 mg total) by mouth daily. 180 tablet 3   apixaban (ELIQUIS) 5 MG TABS tablet Take 1 tablet (5 mg total) by mouth 2 (two) times daily. 180 tablet 1   B-D ULTRAFINE III SHORT PEN 31G X 8 MM MISC 1 each by Other route in the morning, at noon, in the evening, and at bedtime.     blood glucose meter kit and supplies KIT Dispense based on patient and insurance preference. Use up to four times daily as directed. 1 each 0   Blood Glucose Monitoring Suppl (ACCU-CHEK GUIDE ME) w/Device KIT Use as instructed to check blood sugar three times daily. E11.69 1 kit 0   Blood Glucose Monitoring Suppl (ONETOUCH VERIO) w/Device KIT Use to check blood sugar TID. E11.69 1 kit 0   colchicine 0.6 MG tablet Take 0.5 tablets (0.3 mg total) by mouth daily. 90 tablet 0   docusate sodium (COLACE) 100 MG capsule Take 100 mg by mouth 2 (two) times daily.     Dulaglutide (TRULICITY) 1.5 JQ/3.0SP SOPN Inject 1.5 mg into the skin once a week. 0.5 mL 3   DULoxetine (CYMBALTA) 60 MG capsule Take 1 capsule (60 mg total) by mouth daily. 90 capsule 3   EPINEPHrine 0.3  mg/0.3 mL IJ SOAJ injection Inject 0.3 mg into the muscle as needed for anaphylaxis. 1 each 1   glucose blood (ONETOUCH VERIO) test strip Use to check blood sugar TID. E11.69 100 each 6   hydrOXYzine (ATARAX/VISTARIL) 25 MG tablet Take 1 tablet (25 mg total) by mouth 2 (two) times daily as needed. 60 tablet 0   insulin aspart (NOVOLOG) 100 UNIT/ML injection Inject 12 Units into the skin 3 (three) times daily before meals. For blood sugars 0-199 give 0 units of insulin, 201-250 give 4 units, 251-300 give 6 units, 301-350 give 8 units, 351-400 give 10 units,> 400 give 12 units and call M.D. 10 mL 3   insulin detemir (LEVEMIR) 100 UNIT/ML injection INJECT 0.4 MLS (40 UNITS TOTAL) INTO THE SKIN 2 (TWO) TIMES DAILY. 60 mL 1   metoprolol succinate (  TOPROL XL) 25 MG 24 hr tablet Take 1 tablet (25 mg total) by mouth daily. 30 tablet 11   Multiple Vitamins-Minerals (MULTIVITAMIN WOMEN PO) Take 1 tablet by mouth daily.     nitroGLYCERIN (NITROSTAT) 0.4 MG SL tablet Place 1 tablet (0.4 mg total) under the tongue every 5 (five) minutes x 3 doses as needed for chest pain. 25 tablet 12   ondansetron (ZOFRAN ODT) 4 MG disintegrating tablet Take 1 tablet (4 mg total) by mouth every 8 (eight) hours as needed for nausea or vomiting. 20 tablet 0   OneTouch Delica Lancets 33G MISC Use to check blood sugar TID.  E11.69 100 each 6   pantoprazole (PROTONIX) 40 MG tablet Take 40 mg by mouth 2 (two) times daily.     potassium chloride SA (KLOR-CON) 20 MEQ tablet Take 2 tablets (40 mEq total) by mouth daily. 60 tablet 6   RESTASIS 0.05 % ophthalmic emulsion Place 1 drop into both eyes 2 (two) times daily.  12   rosuvastatin (CRESTOR) 10 MG tablet Take 1 tablet (10 mg total) by mouth daily. 90 tablet 1   torsemide (DEMADEX) 20 MG tablet TAKE 4 TABLETS BY MOUTH DAILY. 360 tablet 1   No current facility-administered medications for this visit.    Allergies  Allergen Reactions   Sulfa Antibiotics Itching    Social History    Socioeconomic History   Marital status: Significant Other    Spouse name: Not on file   Number of children: 2   Years of education: Not on file   Highest education level: Some college, no degree  Occupational History   Not on file  Tobacco Use   Smoking status: Never   Smokeless tobacco: Never  Vaping Use   Vaping Use: Never used  Substance and Sexual Activity   Alcohol use: No   Drug use: No   Sexual activity: Not Currently  Other Topics Concern   Not on file  Social History Narrative   Pt lives in [place] with spouse.  2 grown children.   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   Social Determinants of Health   Financial Resource Strain: Medium Risk   Difficulty of Paying Living Expenses: Somewhat hard  Food Insecurity: No Food Insecurity   Worried About Running Out of Food in the Last Year: Never true   Ran Out of Food in the Last Year: Never true  Transportation Needs: Unmet Transportation Needs   Lack of Transportation (Medical): Yes   Lack of Transportation (Non-Medical): No  Physical Activity: Not on file  Stress: Not on file  Social Connections: Not on file  Intimate Partner Violence: Not on file     Review of Systems: All other systems reviewed and are otherwise negative except as noted above.  Physical Exam: Vitals:   04/14/21 0858  BP: (!) 167/86  Pulse: (!) 107  SpO2: 98%  Weight: 273 lb (123.8 kg)  Height: 5' 7" (1.702 m)    General:  Well appearing. No resp difficulty. HEENT: Normal Neck: Supple. JVP 5-6. Carotids 2+ bilat; no bruits. No thyromegaly or nodule noted. Cor: PMI nondisplaced. Irregularly irregular, No M/G/R noted Lungs: Slightly diminished, normal effort. Abdomen: Obese, Soft, non-tender, non-distended, no HSM. No bruits or masses. +BS   Extremities: No cyanosis, clubbing, or rash. R and LLE no edema.  Neuro: Alert & orientedx3, cranial nerves grossly intact. moves all 4 extremities w/o difficulty.  Affect flat but appropriate   EKG is   ordered. Personal review of EKG from today appears to show atypical flutter at 107 bpm   Additional studies reviewed include: EP and HF office notes. Previous Admission notes.   Assessment and Plan:  Persistent AFib / Flutter (most likely atypical) EKG today shows most likely atypical flutter. Continue eliquis for CHA2DS2Vasc of at least 4. Reports compliance.  Increase amiodarone back to 200 mg BID for the time being.  She remains very interested in PPM AV node ablation since mentioned to her in hospital.  Relatively recent s/p TV and MV repairs + MAZE in May. Labs today.  Not currently an atrial ablation candidate. Elevated weight, kidney function, and recent MAZE/valve surgery significantly decrease likelihood of success with elevated risk of complications.  Body mass index is 42.76 kg/m.   Failed tikosyn. No 1c with HF and poor renal function.   2. Chronic systolic CHF Echo 1/43/8887 LVEF 40-45% in sinus (Felt to be 30-35% on 7/8 in fib/flutter) HFpEF, diastolic, historically combined Volume status OK for now. Follow closely.  Sees CHF team again next month. Continue toprol 25 mg daily Consider SGLT2i Consider hydral/imdur Likely not MRA candidate given renal function. Will looked into [place] more. Not sure if she would be a candidate.  3. Valvular heart disease: S/p MV and TV repairs in 5/22.  Recent echo with mild-moderate TR.  There is only mild MR, but mean gradient across the valve is 10 mmHg suggesting mitral stenosis.  However, HR was also around 130 at time, so would favor repeat limited echo for MV assessment when HR is lower/in NSR. - Limited echo showed improved EF 40-45%, MV mean gradient 7 mmHg (HR 82 bpm), moderate TR. Per TCTS. Visit next month.  4. CKD IV Cr settled in 2.4 - 2.6 range Labs today.   She is very symptomatic. Plan [facility] next week, follow up with AF clinic after. Keep appt in Nov with Dr. Rayann Heman to discuss  more definitive options, but overall she is a poor candidate for EP procedures.  Ultimately, may have to just monitor closely on higher dose amiodarone.   Shirley Friar, PA-C  04/14/21 9:29 AM

## 2021-04-14 NOTE — H&P (View-Only) (Signed)
PCP:  [name], NP Primary Cardiologist: Buford Dresser, MD Electrophysiologist: Thompson Grayer, MD   [name] is a 65 y.o. female seen today for Thompson Grayer, MD for acute visit due to recurrent AF .    Admitted 7/8 - 7/11 with aflutter and systolic CHF. Diuresed and she chemically converted to NSR on IV amio. EP consulted for recurrent A flutter. She is felt to not currently be a candidate for PPM AV node ablation given recent MAZE.  Dr Quentin Ore recommended  repeat ECHO in 6 months to come up with definitive plan.   Seen in clinic end of July and was in NSR and overall doing well. Amio decreased. Denies lightheadedness,syncope, or near syncope. Edema remains well controlled. No further fatgue or leg cramps at this time. Is anxious about the idea of a repeat [facility] and asks again about possibility of PPM.   Past Medical History:  Diagnosis Date   Allergic rhinitis    Arthritis    Asthma    Chest pain 12/27/2017   Chronic diastolic CHF (congestive heart failure) (HCC)    COPD (chronic obstructive pulmonary disease) (HCC)    Depression    DM (diabetes mellitus) ([place])    DVT (deep vein thrombosis) in pregnancy    Dysrhythmia    atrial fibrilation   GERD (gastroesophageal reflux disease)    Headache(784.0)    HTN (hypertension)    Hyperlipidemia    Obesity    Pneumonia 04/2018   RIGHT LOBE   Shortness of breath    Sleep apnea    compliant with CPAP   Past Surgical History:  Procedure Laterality Date   ABDOMINAL HYSTERECTOMY     BUBBLE STUDY  11/26/2020   Procedure: BUBBLE STUDY;  Surgeon: Buford Dresser, MD;  Location: [place];  Service: Cardiovascular;;   CARDIOVERSION N/A 05/06/2020   Procedure: CARDIOVERSION;  Surgeon: Buford Dresser, MD;  Location: [facility] ENDOSCOPY;  Service: Cardiovascular;  Laterality: N/A;   CARDIOVERSION N/A 11/04/2020   Procedure: CARDIOVERSION;  Surgeon: Lelon Perla, MD;  Location: [place];  Service:  Cardiovascular;  Laterality: N/A;   CARDIOVERSION N/A 02/01/2021   Procedure: CARDIOVERSION;  Surgeon: Jolaine Artist, MD;  Location: [place];  Service: Cardiovascular;  Laterality: N/A;   CARDIOVERSION N/A 02/09/2021   Procedure: CARDIOVERSION;  Surgeon: Jolaine Artist, MD;  Location: [place];  Service: Cardiovascular;  Laterality: N/A;   CATARACT EXTRACTION, BILATERAL     CHOLECYSTECTOMY     COLONOSCOPY WITH PROPOFOL N/A 03/05/2015   Procedure: COLONOSCOPY WITH PROPOFOL;  Surgeon: Carol Ada, MD;  Location: [facility] ENDOSCOPY;  Service: Endoscopy;  Laterality: N/A;   DIAGNOSTIC LAPAROSCOPY     ingrown hallux Left    KNEE SURGERY     LEFT HEART CATH AND CORONARY ANGIOGRAPHY N/A 12/31/2017   Procedure: LEFT HEART CATH AND CORONARY ANGIOGRAPHY;  Surgeon: Charolette Forward, MD;  Location: [place] [facility];  Service: Cardiovascular;  Laterality: N/A;   MAZE N/A 12/29/2020   Procedure: MAZE;  Surgeon: [name], MD;  Location: [place];  Service: Open Heart Surgery;  Laterality: N/A;   MITRAL VALVE REPAIR N/A 12/29/2020   Procedure: MITRAL VALVE REPAIR USING CARBOMEDICS ANNULOFLEX RING SIZE 30;  Surgeon: [name], MD;  Location: [place];  Service: Open Heart Surgery;  Laterality: N/A;   RIGHT HEART CATH N/A 12/22/2020   Procedure: RIGHT HEART CATH;  Surgeon: Larey Dresser, MD;  Location: [place] [facility];  Service: Cardiovascular;  Laterality: N/A;  RIGHT HEART CATH N/A 01/31/2021   Procedure: RIGHT HEART CATH;  Surgeon: Jolaine Artist, MD;  Location: [place] [facility];  Service: Cardiovascular;  Laterality: N/A;   TEE WITHOUT CARDIOVERSION N/A 11/26/2020   Procedure: TRANSESOPHAGEAL ECHOCARDIOGRAM (TEE);  Surgeon: Buford Dresser, MD;  Location: [place] ENDOSCOPY;  Service: Cardiovascular;  Laterality: N/A;   TEE WITHOUT CARDIOVERSION N/A 12/29/2020   Procedure: TRANSESOPHAGEAL ECHOCARDIOGRAM (TEE);  Surgeon: [name], MD;  Location: [place];   Service: Open Heart Surgery;  Laterality: N/A;   TRICUSPID VALVE REPLACEMENT N/A 12/29/2020   Procedure: 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 RING SIZE 34;  Surgeon: [name], MD;  Location: [place];  Service: Open Heart Surgery;  Laterality: N/A;    Current Outpatient Medications  Medication Sig Dispense Refill   Accu-Chek Softclix Lancets lancets Use as instructed to check blood sugar three times daily. E11.69 100 each 12   albuterol (VENTOLIN HFA) 108 (90 Base) MCG/ACT inhaler Inhale 2 puffs into the lungs every 4 (four) hours as needed for wheezing or shortness of breath.      Alcohol Swabs (B-D SINGLE USE SWABS REGULAR) PADS 1 each by Other route daily.     ALPRAZolam (XANAX) 0.5 MG tablet Take 1 tablet (0.5 mg total) by mouth 2 (two) times daily as needed for anxiety. 30 tablet 0   amiodarone (PACERONE) 200 MG tablet Take 1 tablet (200 mg total) by mouth daily. 180 tablet 3   apixaban (ELIQUIS) 5 MG TABS tablet Take 1 tablet (5 mg total) by mouth 2 (two) times daily. 180 tablet 1   B-D ULTRAFINE III SHORT PEN 31G X 8 MM MISC 1 each by Other route in the morning, at noon, in the evening, and at bedtime.     blood glucose meter kit and supplies KIT Dispense based on patient and insurance preference. Use up to four times daily as directed. 1 each 0   Blood Glucose Monitoring Suppl (ACCU-CHEK GUIDE ME) w/Device KIT Use as instructed to check blood sugar three times daily. E11.69 1 kit 0   Blood Glucose Monitoring Suppl (ONETOUCH VERIO) w/Device KIT Use to check blood sugar TID. E11.69 1 kit 0   colchicine 0.6 MG tablet Take 0.5 tablets (0.3 mg total) by mouth daily. 90 tablet 0   docusate sodium (COLACE) 100 MG capsule Take 100 mg by mouth 2 (two) times daily.     Dulaglutide (TRULICITY) 1.5 JQ/3.0SP SOPN Inject 1.5 mg into the skin once a week. 0.5 mL 3   DULoxetine (CYMBALTA) 60 MG capsule Take 1 capsule (60 mg total) by mouth daily. 90 capsule 3   EPINEPHrine 0.3  mg/0.3 mL IJ SOAJ injection Inject 0.3 mg into the muscle as needed for anaphylaxis. 1 each 1   glucose blood (ONETOUCH VERIO) test strip Use to check blood sugar TID. E11.69 100 each 6   hydrOXYzine (ATARAX/VISTARIL) 25 MG tablet Take 1 tablet (25 mg total) by mouth 2 (two) times daily as needed. 60 tablet 0   insulin aspart (NOVOLOG) 100 UNIT/ML injection Inject 12 Units into the skin 3 (three) times daily before meals. For blood sugars 0-199 give 0 units of insulin, 201-250 give 4 units, 251-300 give 6 units, 301-350 give 8 units, 351-400 give 10 units,> 400 give 12 units and call M.D. 10 mL 3   insulin detemir (LEVEMIR) 100 UNIT/ML injection INJECT 0.4 MLS (40 UNITS TOTAL) INTO THE SKIN 2 (TWO) TIMES DAILY. 60 mL 1   metoprolol succinate (  TOPROL XL) 25 MG 24 hr tablet Take 1 tablet (25 mg total) by mouth daily. 30 tablet 11   Multiple Vitamins-Minerals (MULTIVITAMIN WOMEN PO) Take 1 tablet by mouth daily.     nitroGLYCERIN (NITROSTAT) 0.4 MG SL tablet Place 1 tablet (0.4 mg total) under the tongue every 5 (five) minutes x 3 doses as needed for chest pain. 25 tablet 12   ondansetron (ZOFRAN ODT) 4 MG disintegrating tablet Take 1 tablet (4 mg total) by mouth every 8 (eight) hours as needed for nausea or vomiting. 20 tablet 0   OneTouch Delica Lancets 86P MISC Use to check blood sugar TID.  E11.69 100 each 6   pantoprazole (PROTONIX) 40 MG tablet Take 40 mg by mouth 2 (two) times daily.     potassium chloride SA (KLOR-CON) 20 MEQ tablet Take 2 tablets (40 mEq total) by mouth daily. 60 tablet 6   RESTASIS 0.05 % ophthalmic emulsion Place 1 drop into both eyes 2 (two) times daily.  12   rosuvastatin (CRESTOR) 10 MG tablet Take 1 tablet (10 mg total) by mouth daily. 90 tablet 1   torsemide (DEMADEX) 20 MG tablet TAKE 4 TABLETS BY MOUTH DAILY. 360 tablet 1   No current facility-administered medications for this visit.    Allergies  Allergen Reactions   Sulfa Antibiotics Itching    Social History    Socioeconomic History   Marital status: Significant Other    Spouse name: Not on file   Number of children: 2   Years of education: Not on file   Highest education level: Some college, no degree  Occupational History   Not on file  Tobacco Use   Smoking status: Never   Smokeless tobacco: Never  Vaping Use   Vaping Use: Never used  Substance and Sexual Activity   Alcohol use: No   Drug use: No   Sexual activity: Not Currently  Other Topics Concern   Not on file  Social History Narrative   Pt lives in [place] with spouse.  2 grown children.   Previously worked in [employer] at [employer].  Now on disability   Social Determinants of Health   Financial Resource Strain: Medium Risk   Difficulty of Paying Living Expenses: Somewhat hard  Food Insecurity: No Food Insecurity   Worried About Charity fundraiser in the Last Year: Never true   Ran Out of Food in the Last Year: Never true  Transportation Needs: Unmet Transportation Needs   Lack of Transportation (Medical): Yes   Lack of Transportation (Non-Medical): No  Physical Activity: Not on file  Stress: Not on file  Social Connections: Not on file  Intimate Partner Violence: Not on file     Review of Systems: All other systems reviewed and are otherwise negative except as noted above.  Physical Exam: Vitals:   04/14/21 0858  BP: (!) 167/86  Pulse: (!) 107  SpO2: 98%  Weight: 273 lb (123.8 kg)  Height: _0  (1.702 m)    General:  Well appearing. No resp difficulty. HEENT: Normal Neck: Supple. JVP 5-6. Carotids 2+ bilat; no bruits. No thyromegaly or nodule noted. Cor: PMI nondisplaced. Irregularly irregular, No M/G/R noted Lungs: Slightly diminished, normal effort. Abdomen: Obese, Soft, non-tender, non-distended, no HSM. No bruits or masses. +BS   Extremities: No cyanosis, clubbing, or rash. R and LLE no edema.  Neuro: Alert & orientedx3, cranial nerves grossly intact. moves all 4 extremities w/o difficulty.  Affect flat but appropriate   EKG is  ordered. Personal review of EKG from today appears to show atypical flutter at 107 bpm   Additional studies reviewed include: EP and HF office notes. Previous Admission notes.   Assessment and Plan:  Persistent AFib / Flutter (most likely atypical) EKG today shows most likely atypical flutter. Continue eliquis for CHA2DS2Vasc of at least 4. Reports compliance.  Increase amiodarone back to 200 mg BID for the time being.  She remains very interested in PPM AV node ablation since mentioned to her in hospital.  Relatively recent s/p TV and MV repairs + MAZE in May. Labs today.  Not currently an atrial ablation candidate. Elevated weight, kidney function, and recent MAZE/valve surgery significantly decrease likelihood of success with elevated risk of complications.  Body mass index is 42.76 kg/m.   Failed tikosyn. No 1c with HF and poor renal function.   2. Chronic systolic CHF Echo 1/43/8887 LVEF 40-45% in sinus (Felt to be 30-35% on 7/8 in fib/flutter) HFpEF, diastolic, historically combined Volume status OK for now. Follow closely.  Sees CHF team again next month. Continue toprol 25 mg daily Consider SGLT2i Consider hydral/imdur Likely not MRA candidate given renal function. Will looked into [place] more. Not sure if she would be a candidate.  3. Valvular heart disease: S/p MV and TV repairs in 5/22.  Recent echo with mild-moderate TR.  There is only mild MR, but mean gradient across the valve is 10 mmHg suggesting mitral stenosis.  However, HR was also around 130 at time, so would favor repeat limited echo for MV assessment when HR is lower/in NSR. - Limited echo showed improved EF 40-45%, MV mean gradient 7 mmHg (HR 82 bpm), moderate TR. Per TCTS. Visit next month.  4. CKD IV Cr settled in 2.4 - 2.6 range Labs today.   She is very symptomatic. Plan [facility] next week, follow up with AF clinic after. Keep appt in Nov with Dr. Rayann Heman to discuss  more definitive options, but overall she is a poor candidate for EP procedures.  Ultimately, may have to just monitor closely on higher dose amiodarone.   Shirley Friar, PA-C  04/14/21 9:29 AM

## 2021-04-15 ENCOUNTER — Ambulatory Visit (HOSPITAL_COMMUNITY): Payer: Medicare Other

## 2021-04-15 ENCOUNTER — Other Ambulatory Visit (HOSPITAL_COMMUNITY): Payer: Self-pay

## 2021-04-18 ENCOUNTER — Telehealth (HOSPITAL_BASED_OUTPATIENT_CLINIC_OR_DEPARTMENT_OTHER): Payer: Self-pay | Admitting: Cardiology

## 2021-04-18 ENCOUNTER — Ambulatory Visit (HOSPITAL_COMMUNITY): Payer: Medicare Other

## 2021-04-18 NOTE — Telephone Encounter (Signed)
Patient calling with concerns for her procedure on tomorrow 8/30. Patient would like to discuss with dr Harrell Gave the concerns. Please advise

## 2021-04-18 NOTE — Telephone Encounter (Signed)
Pt would not elaborate on message only wanted to talk to Dr Harrell Gave about Cardioversion scheduled for tom . Pt would like to talk to Dr Louretta Parma Will forward message .Adonis Housekeeper

## 2021-04-19 ENCOUNTER — Ambulatory Visit (HOSPITAL_COMMUNITY): Payer: Medicare Other | Admitting: Anesthesiology

## 2021-04-19 ENCOUNTER — Other Ambulatory Visit: Payer: Self-pay

## 2021-04-19 ENCOUNTER — Ambulatory Visit (HOSPITAL_COMMUNITY)
Admission: RE | Admit: 2021-04-19 | Discharge: 2021-04-19 | Disposition: A | Discharge status: Home / Self Care | Payer: Medicare Other | Attending: Internal Medicine | Admitting: Internal Medicine

## 2021-04-19 ENCOUNTER — Encounter (HOSPITAL_COMMUNITY)
Admission: RE | Disposition: A | Discharge status: Home / Self Care | Payer: Self-pay | Source: Home / Self Care | Attending: Internal Medicine

## 2021-04-19 ENCOUNTER — Encounter (HOSPITAL_COMMUNITY): Payer: Self-pay | Admitting: Internal Medicine

## 2021-04-19 DIAGNOSIS — E1122 Type 2 diabetes mellitus with diabetic chronic kidney disease: Secondary | ICD-10-CM | POA: Insufficient documentation

## 2021-04-19 DIAGNOSIS — Z882 Allergy status to sulfonamides status: Secondary | ICD-10-CM | POA: Insufficient documentation

## 2021-04-19 DIAGNOSIS — I059 Rheumatic mitral valve disease, unspecified: Secondary | ICD-10-CM | POA: Insufficient documentation

## 2021-04-19 DIAGNOSIS — I484 Atypical atrial flutter: Secondary | ICD-10-CM | POA: Diagnosis not present

## 2021-04-19 DIAGNOSIS — Z794 Long term (current) use of insulin: Secondary | ICD-10-CM | POA: Diagnosis not present

## 2021-04-19 DIAGNOSIS — I5042 Chronic combined systolic (congestive) and diastolic (congestive) heart failure: Secondary | ICD-10-CM | POA: Insufficient documentation

## 2021-04-19 DIAGNOSIS — Z79899 Other long term (current) drug therapy: Secondary | ICD-10-CM | POA: Insufficient documentation

## 2021-04-19 DIAGNOSIS — I4891 Unspecified atrial fibrillation: Secondary | ICD-10-CM | POA: Diagnosis not present

## 2021-04-19 DIAGNOSIS — Z7901 Long term (current) use of anticoagulants: Secondary | ICD-10-CM | POA: Insufficient documentation

## 2021-04-19 DIAGNOSIS — I130 Hypertensive heart and chronic kidney disease with heart failure and stage 1 through stage 4 chronic kidney disease, or unspecified chronic kidney disease: Secondary | ICD-10-CM | POA: Diagnosis not present

## 2021-04-19 DIAGNOSIS — I4819 Other persistent atrial fibrillation: Secondary | ICD-10-CM | POA: Insufficient documentation

## 2021-04-19 DIAGNOSIS — N184 Chronic kidney disease, stage 4 (severe): Secondary | ICD-10-CM | POA: Diagnosis not present

## 2021-04-19 HISTORY — PX: CARDIOVERSION: SHX1299

## 2021-04-19 LAB — GLUCOSE, CAPILLARY: Glucose-Capillary: 62 mg/dL — ABNORMAL LOW (ref 70–99)

## 2021-04-19 SURGERY — CARDIOVERSION
Anesthesia: General

## 2021-04-19 MED ORDER — LIDOCAINE 2% (20 MG/ML) 5 ML SYRINGE
INTRAMUSCULAR | Status: DC | PRN
  Administered 2021-04-19: 40 mg via INTRAVENOUS

## 2021-04-19 MED ORDER — PROPOFOL 10 MG/ML IV BOLUS
INTRAVENOUS | Status: DC | PRN
  Administered 2021-04-19: 100 mg via INTRAVENOUS

## 2021-04-19 MED ORDER — SODIUM CHLORIDE 0.9 % IV SOLN
INTRAVENOUS | Status: DC | PRN
Start: 2021-04-19 — End: 2021-04-19

## 2021-04-19 MED ORDER — APIXABAN 5 MG PO TABS
5.0000 mg | ORAL_TABLET | Freq: Once | ORAL | Status: AC
  Administered 2021-04-19: 5 mg via ORAL
  Filled 2021-04-19: qty 1

## 2021-04-19 NOTE — Transfer of Care (Signed)
Immediate Anesthesia Transfer of Care Note  Patient: [name]  Procedure(s) Performed: CARDIOVERSION  Patient Location: Endoscopy Unit  Anesthesia Type:General  Level of Consciousness: sedated and patient cooperative  Airway & Oxygen Therapy: Patient Spontanous Breathing and Patient connected to nasal cannula oxygen  Post-op Assessment: Report given to RN and Post -op Vital signs reviewed and stable  Post vital signs: Reviewed  Last Vitals:  Vitals Value Taken Time  BP 146/58 04/19/21 1016  Temp 37.2 C 04/19/21 0956  Pulse 75 04/19/21 1016  Resp 20 04/19/21 1016  SpO2 100 % 04/19/21 1016  Vitals shown include unvalidated device data.  Last Pain:  Vitals:   04/19/21 1016  TempSrc:   PainSc: 0-No pain         Complications: No notable events documented.

## 2021-04-19 NOTE — Interval H&P Note (Signed)
History and Physical Interval Note:  04/19/2021 9:28 AM  [name]  has presented today for surgery, with the diagnosis of AFIB.  The various methods of treatment have been discussed with the patient and family. After consideration of risks, benefits and other options for treatment, the patient has consented to  Procedure(s): CARDIOVERSION (N/A) as a surgical intervention.  The patient's history has been reviewed, patient examined, no change in status, stable for surgery.  I have reviewed the patient's chart and labs.  Questions were answered to the patient's satisfaction.     Dorris Carnes

## 2021-04-19 NOTE — Discharge Instructions (Signed)
Electrical Cardioversion Electrical cardioversion is the delivery of a jolt of electricity to restore a normal rhythm to the heart. A rhythm that is too fast or is not regular keeps the heart from pumping well. In this procedure, sticky patches or metal paddles are placed on the chest to deliver electricity to the heart from a device. This procedure may be done in an emergency if: There is low or no blood pressure as a result of the heart rhythm. Normal rhythm must be restored as fast as possible to protect the brain and heart from further damage. It may save a life. This may also be a scheduled procedure for irregular or fast heart rhythms that are not immediately life-threatening.  What can I expect after the procedure? Your blood pressure, heart rate, breathing rate, and blood oxygen level will be monitored until you leave the hospital or clinic. Your heart rhythm will be watched to make sure it does not change. You may have some redness on the skin where the shocks were given. Over the counter cortizone cream may be helpful.  Follow these instructions at home: Do not drive for 24 hours if you were given a sedative during your procedure. Take over-the-counter and prescription medicines only as told by your health care provider. Ask your health care provider how to check your pulse. Check it often. Rest for 48 hours after the procedure or as told by your health care provider. Avoid or limit your caffeine use as told by your health care provider. Keep all follow-up visits as told by your health care provider. This is important. Contact a health care provider if: You feel like your heart is beating too quickly or your pulse is not regular. You have a serious muscle cramp that does not go away. Get help right away if: You have discomfort in your chest. You are dizzy or you feel faint. You have trouble breathing or you are short of breath. Your speech is slurred. You have trouble moving an  arm or leg on one side of your body. Your fingers or toes turn cold or blue. Summary Electrical cardioversion is the delivery of a jolt of electricity to restore a normal rhythm to the heart. This procedure may be done right away in an emergency or may be a scheduled procedure if the condition is not an emergency. Generally, this is a safe procedure. After the procedure, check your pulse often as told by your health care provider. This information is not intended to replace advice given to you by your health care provider. Make sure you discuss any questions you have with your health care provider. Document Revised: 03/10/2019 Document Reviewed: 03/10/2019 Elsevier Patient Education  2020 Elsevier Inc.  

## 2021-04-19 NOTE — Anesthesia Postprocedure Evaluation (Signed)
Anesthesia Post Note  Patient: [name]  Procedure(s) Performed: CARDIOVERSION     Patient location during evaluation: Endoscopy Anesthesia Type: General Level of consciousness: awake and alert, patient cooperative and oriented Pain management: pain level controlled Vital Signs Assessment: post-procedure vital signs reviewed and stable Respiratory status: nonlabored ventilation, spontaneous breathing and respiratory function stable Cardiovascular status: stable and blood pressure returned to baseline Postop Assessment: no apparent nausea or vomiting, able to ambulate and adequate PO intake Anesthetic complications: no   No notable events documented.  Last Vitals:  Vitals:   04/19/21 1006 04/19/21 1016  BP: (!) 131/56 (!) 146/58  Pulse: 74 75  Resp: 16 20  Temp:    SpO2: 100% 100%    Last Pain:  Vitals:   04/19/21 1016  TempSrc:   PainSc: 0-No pain                 Sherlin Sonier,E. Annaliz Aven

## 2021-04-19 NOTE — Anesthesia Preprocedure Evaluation (Addendum)
Anesthesia Evaluation  Patient identified by MRN, date of birth, ID band Patient awake    Reviewed: Allergy & Precautions, NPO status , Patient's Chart, lab work & pertinent test results  History of Anesthesia Complications Negative for: history of anesthetic complications  Airway Mallampati: II  TM Distance: >3 FB Neck ROM: Full    Dental  (+) Dental Advisory Given   Pulmonary shortness of breath, sleep apnea and Continuous Positive Airway Pressure Ventilation , COPD,  COPD inhaler,    breath sounds clear to auscultation       Cardiovascular hypertension, (-) angina+CHF  + dysrhythmias Atrial Fibrillation  Rhythm:Regular Rate:Normal  02/2021 ECHO: EF 40 to 45%. The LV has mildly decreased function, global hypokinesis. There is mild concentric LVH, mildly reduced RV function, s/p MVR, mild MR, mod TR   Neuro/Psych  Headaches, Depression    GI/Hepatic Neg liver ROS, GERD  Controlled,  Endo/Other  diabetes, Type 2, Insulin DependentMorbid obesity  Renal/GU Renal InsufficiencyRenal disease     Musculoskeletal   Abdominal (+) + obese,   Peds  Hematology  (+) Blood dyscrasia (Hb 10.5), anemia , eliquis   Anesthesia Other Findings   Reproductive/Obstetrics                           Anesthesia Physical Anesthesia Plan  ASA: 3  Anesthesia Plan: General   Post-op Pain Management:    Induction: Intravenous  PONV Risk Score and Plan: 3 and Ondansetron and Dexamethasone  Airway Management Planned: Natural Airway and Mask  Additional Equipment: None  Intra-op Plan:   Post-operative Plan:   Informed Consent: I have reviewed the patients History and Physical, chart, labs and discussed the procedure including the risks, benefits and alternatives for the proposed anesthesia with the patient or authorized representative who has indicated his/her understanding and acceptance.     Dental advisory  given  Plan Discussed with: CRNA and Surgeon  Anesthesia Plan Comments:        Anesthesia Quick Evaluation

## 2021-04-19 NOTE — Anesthesia Procedure Notes (Signed)
Procedure Name: General with mask airway Date/Time: 04/19/2021 9:39 AM Performed by: Jenne Campus, CRNA Pre-anesthesia Checklist: Patient identified, Emergency Drugs available, Suction available and Patient being monitored Patient Re-evaluated:Patient Re-evaluated prior to induction Oxygen Delivery Method: Ambu bag

## 2021-04-19 NOTE — Interval H&P Note (Signed)
History and Physical Interval Note:  04/19/2021 8:51 AM  [name]  has presented today for surgery, with the diagnosis of AFIB.  The various methods of treatment have been discussed with the patient and family. After consideration of risks, benefits and other options for treatment, the patient has consented to  Procedure(s): CARDIOVERSION (N/A) as a surgical intervention.  The patient's history has been reviewed, patient examined, no change in status, stable for surgery.  I have reviewed the patient's chart and labs.  Questions were answered to the patient's satisfaction.     Dorris Carnes

## 2021-04-19 NOTE — CV Procedure (Signed)
CARDIOVERSION  Patient anesthetized by anesthesia with 40 mg lidocaine and 100 mg propofol intravenously  With pads in AP position, patient cardioverted to SR with 200 J synchronized biphasic energy  Procedure was without complication  12 lead EKG pending   Dorris Carnes MD

## 2021-04-20 ENCOUNTER — Ambulatory Visit (HOSPITAL_COMMUNITY): Payer: Medicare Other

## 2021-04-20 NOTE — Telephone Encounter (Signed)
Appears she has already had cardioversion, but if she has other questions/concerns please let me know.

## 2021-04-21 ENCOUNTER — Encounter (HOSPITAL_COMMUNITY): Payer: Self-pay | Admitting: Internal Medicine

## 2021-04-21 NOTE — Telephone Encounter (Signed)
Spoke with pt who report she has some personal concerns that she would like to discuss with Dr. Harrell Gave only.  Appointment scheduled for 9/14 to discuss further.

## 2021-04-22 ENCOUNTER — Ambulatory Visit (HOSPITAL_COMMUNITY): Payer: Medicare Other

## 2021-04-22 ENCOUNTER — Other Ambulatory Visit (INDEPENDENT_AMBULATORY_CARE_PROVIDER_SITE_OTHER): Payer: Self-pay | Admitting: Primary Care

## 2021-04-22 ENCOUNTER — Other Ambulatory Visit: Payer: Self-pay | Admitting: Cardiology

## 2021-04-22 DIAGNOSIS — I5041 Acute combined systolic (congestive) and diastolic (congestive) heart failure: Secondary | ICD-10-CM

## 2021-04-22 DIAGNOSIS — E1142 Type 2 diabetes mellitus with diabetic polyneuropathy: Secondary | ICD-10-CM

## 2021-04-22 NOTE — Telephone Encounter (Signed)
Sent to PCP ?

## 2021-04-26 ENCOUNTER — Other Ambulatory Visit: Payer: Self-pay

## 2021-04-26 ENCOUNTER — Ambulatory Visit (HOSPITAL_COMMUNITY)
Admission: RE | Admit: 2021-04-26 | Discharge: 2021-04-26 | Disposition: A | Discharge status: Home / Self Care | Payer: Medicare Other | Source: Ambulatory Visit | Attending: Nurse Practitioner | Admitting: Nurse Practitioner

## 2021-04-26 VITALS — BP 142/96 | HR 106 | Ht 67.0 in | Wt 283.2 lb

## 2021-04-26 DIAGNOSIS — D6869 Other thrombophilia: Secondary | ICD-10-CM

## 2021-04-26 DIAGNOSIS — I4819 Other persistent atrial fibrillation: Secondary | ICD-10-CM | POA: Diagnosis not present

## 2021-04-26 DIAGNOSIS — E118 Type 2 diabetes mellitus with unspecified complications: Secondary | ICD-10-CM | POA: Diagnosis not present

## 2021-04-26 DIAGNOSIS — I4892 Unspecified atrial flutter: Secondary | ICD-10-CM | POA: Insufficient documentation

## 2021-04-26 DIAGNOSIS — Z8249 Family history of ischemic heart disease and other diseases of the circulatory system: Secondary | ICD-10-CM | POA: Diagnosis not present

## 2021-04-26 DIAGNOSIS — Z794 Long term (current) use of insulin: Secondary | ICD-10-CM | POA: Insufficient documentation

## 2021-04-26 DIAGNOSIS — I480 Paroxysmal atrial fibrillation: Secondary | ICD-10-CM | POA: Diagnosis not present

## 2021-04-26 DIAGNOSIS — N189 Chronic kidney disease, unspecified: Secondary | ICD-10-CM | POA: Insufficient documentation

## 2021-04-26 DIAGNOSIS — Z7901 Long term (current) use of anticoagulants: Secondary | ICD-10-CM | POA: Insufficient documentation

## 2021-04-26 DIAGNOSIS — Z79899 Other long term (current) drug therapy: Secondary | ICD-10-CM | POA: Diagnosis not present

## 2021-04-26 DIAGNOSIS — I5023 Acute on chronic systolic (congestive) heart failure: Secondary | ICD-10-CM | POA: Insufficient documentation

## 2021-04-26 MED ORDER — METOPROLOL SUCCINATE ER 25 MG PO TB24: 37.5000 mg | ORAL_TABLET | Freq: Every day | ORAL | 11 refills | Status: DC

## 2021-04-26 NOTE — Progress Notes (Signed)
Primary Care Physician: [name], NP Referring Physician:   Shree Espey is a 65 y.o. female with , type 2 DM, morbid obesity, CKD, a a hx of  many  years of longstanding persisitent afib ( ekg's reviewed from all the back to 2011 show persistent  afib) , previoulsy  followed by Dr. Terrence Dupont until recently, established with  Dr. Harrell Gave, 03/31/20.  She set her up for a cardioversion, she did convert to SR, but she had ERAF possibly after just a few hours. 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 She  did at one time weigh around 500-600 lbs. Her left atrium size is 5.3 indicating severe left atrial enlargement.She  is bothered with fatigue and shortness of breath. She is on pradaxa 150 mg bid. Dr. Harrell Gave wanted me to discuss tikosyn admit with the pt.  She was admitted for [facility] 11/02/20. Unfortunately, Tikosyn did not work to keep her in [place]. She was started on amiodarone 200 mg bid and d/c home. She then went on to have  an hospitalization in May 2022,  with acute on chronic HF, wight up 30 lbs and was diuresed. She then went on to  Mitral and Tricuspid Valvular ring annuloplasty, Left and Right sided MAZE procedure, and clipping of her Left Atrial Appendage with Dr. Roxan Hockey.  She tolerated the procedure without difficulty and was taken to the SICU in stable condition.  she was in NSR after surgery. SHe was followed by HF team. Her creatinine peaked at 4.36. She was supported on IV milrinone  for several days after surgery.   She then was hospitalized again 6/11 thru 6/15 with acute exacerbation of CHF. Again diuresed and had cardioversion. She was in SR on discharge.   Admitted again 02/07/21 thru 02/10/21 with substernal chest pain, nausea/vomiting and abdominal pain. She was back in afib with RVR. She again had repeat cardioversion.   She again had admission  02/25/21 with afib with RVR. She was out of amiodarone x one week. She  was started on IV amiodarone and converted. SHe was discharged on 200 mg bid of amiodarone.  EP consulted and wanted to defer treatment for 6 months until repeat echo. At that time there was discussion of PPM with AV nodal ablation.  She  was then seen acutely  by [name], PA, for Dr. Rayann Heman,  8/25 for being in persistent afib. She was not felt to be a current candidate for PPM with AV nodal ablation as she had just had Maze procedure in May. She was set up for cardioversion 04/19/21.   F/u in Afib clinic, 04/26/21, for successful cardioversion 04/19/21.  She is  back in afib with v rate at 106 bpm. She states  that her weight is up 18.7 lbs. She feels heavy in her chest and has exertional dyspnea and fatigue and can only walk short distances. She chronically sleeps with 3 pillows at night.   Today, she denies symptoms of palpitations, chest pain, shortness of breath, orthopnea, PND, lower extremity edema, dizziness, presyncope, syncope, or neurologic sequela. The patient is tolerating medications without difficulties and is otherwise without complaint today.   Past Medical History:  Diagnosis Date   Allergic rhinitis    Arthritis    Asthma    Chest pain 12/27/2017   Chronic diastolic CHF (congestive heart failure) (HCC)    COPD (chronic obstructive pulmonary disease) ([place])    Depression  DM (diabetes mellitus) ([place])    DVT (deep vein thrombosis) in pregnancy    Dysrhythmia    atrial fibrilation   GERD (gastroesophageal reflux disease)    Headache(784.0)    HTN (hypertension)    Hyperlipidemia    Obesity    Pneumonia 04/2018   RIGHT LOBE   Shortness of breath    Sleep apnea    compliant with CPAP   Past Surgical History:  Procedure Laterality Date   ABDOMINAL HYSTERECTOMY     BUBBLE STUDY  11/26/2020   Procedure: BUBBLE STUDY;  Surgeon: Buford Dresser, MD;  Location: [place];  Service: Cardiovascular;;   CARDIOVERSION N/A 05/06/2020   Procedure: CARDIOVERSION;   Surgeon: Buford Dresser, MD;  Location: [place];  Service: Cardiovascular;  Laterality: N/A;   CARDIOVERSION N/A 11/04/2020   Procedure: CARDIOVERSION;  Surgeon: Lelon Perla, MD;  Location: [facility] ENDOSCOPY;  Service: Cardiovascular;  Laterality: N/A;   CARDIOVERSION N/A 02/01/2021   Procedure: CARDIOVERSION;  Surgeon: Jolaine Artist, MD;  Location: [facility] ENDOSCOPY;  Service: Cardiovascular;  Laterality: N/A;   CARDIOVERSION N/A 02/09/2021   Procedure: CARDIOVERSION;  Surgeon: Jolaine Artist, MD;  Location: [facility] ENDOSCOPY;  Service: Cardiovascular;  Laterality: N/A;   CARDIOVERSION N/A 04/19/2021   Procedure: CARDIOVERSION;  Surgeon: Fay Records, MD;  Location: [facility] ENDOSCOPY;  Service: Cardiovascular;  Laterality: N/A;   CATARACT EXTRACTION, BILATERAL     CHOLECYSTECTOMY     COLONOSCOPY WITH PROPOFOL N/A 03/05/2015   Procedure: COLONOSCOPY WITH PROPOFOL;  Surgeon: Carol Ada, MD;  Location: [facility] ENDOSCOPY;  Service: Endoscopy;  Laterality: N/A;   DIAGNOSTIC LAPAROSCOPY     ingrown hallux Left    KNEE SURGERY     LEFT HEART CATH AND CORONARY ANGIOGRAPHY N/A 12/31/2017   Procedure: LEFT HEART CATH AND CORONARY ANGIOGRAPHY;  Surgeon: Charolette Forward, MD;  Location: [place] CV LAB;  Service: Cardiovascular;  Laterality: N/A;   MAZE N/A 12/29/2020   Procedure: MAZE;  Surgeon: [name], MD;  Location: [place];  Service: Open Heart Surgery;  Laterality: N/A;   MITRAL VALVE REPAIR N/A 12/29/2020   Procedure: MITRAL VALVE REPAIR USING CARBOMEDICS ANNULOFLEX RING SIZE 30;  Surgeon: [name], MD;  Location: [place];  Service: Open Heart Surgery;  Laterality: N/A;   RIGHT HEART CATH N/A 12/22/2020   Procedure: RIGHT HEART CATH;  Surgeon: Larey Dresser, MD;  Location: [place] CV LAB;  Service: Cardiovascular;  Laterality: N/A;   RIGHT HEART CATH N/A 01/31/2021   Procedure: RIGHT HEART CATH;  Surgeon: Jolaine Artist, MD;  Location: [place] CV LAB;   Service: Cardiovascular;  Laterality: N/A;   TEE WITHOUT CARDIOVERSION N/A 11/26/2020   Procedure: TRANSESOPHAGEAL ECHOCARDIOGRAM (TEE);  Surgeon: Buford Dresser, MD;  Location: [facility] ENDOSCOPY;  Service: Cardiovascular;  Laterality: N/A;   TEE WITHOUT CARDIOVERSION N/A 12/29/2020   Procedure: TRANSESOPHAGEAL ECHOCARDIOGRAM (TEE);  Surgeon: [name], MD;  Location: [place];  Service: Open Heart Surgery;  Laterality: N/A;   TRICUSPID VALVE REPLACEMENT N/A 12/29/2020   Procedure: 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 RING SIZE 34;  Surgeon: [name], MD;  Location: [place];  Service: Open Heart Surgery;  Laterality: N/A;    Current Outpatient Medications  Medication Sig Dispense Refill   Accu-Chek Softclix Lancets lancets Use as instructed to check blood sugar three times daily. E11.69 100 each 12   albuterol (VENTOLIN HFA) 108 (90 Base) MCG/ACT inhaler Inhale 2 puffs into the lungs every 4 (  four) hours as needed (Asthma).     Alcohol Swabs (B-D SINGLE USE SWABS REGULAR) PADS 1 each by Other route daily.     ALPRAZolam (XANAX) 0.5 MG tablet Take 1 tablet (0.5 mg total) by mouth 2 (two) times daily as needed for anxiety. 30 tablet 0   amiodarone (PACERONE) 200 MG tablet Take 1 tablet (200 mg total) by mouth 2 (two) times daily. 180 tablet 3   apixaban (ELIQUIS) 5 MG TABS tablet Take 1 tablet (5 mg total) by mouth 2 (two) times daily. 180 tablet 1   B-D ULTRAFINE III SHORT PEN 31G X 8 MM MISC 1 each by Other route in the morning, at noon, in the evening, and at bedtime.     blood glucose meter kit and supplies KIT Dispense based on patient and insurance preference. Use up to four times daily as directed. 1 each 0   Blood Glucose Monitoring Suppl (ACCU-CHEK GUIDE ME) w/Device KIT Use as instructed to check blood sugar three times daily. E11.69 1 kit 0   Blood Glucose Monitoring Suppl (ONETOUCH VERIO) w/Device KIT Use to check blood sugar TID. E11.69 1 kit 0    colchicine 0.6 MG tablet Taking 1/2 tablet by mouth daily     docusate sodium (COLACE) 250 MG capsule Take 100 mg by mouth daily.     Dulaglutide (TRULICITY) 1.5 TK/1.6WF SOPN Inject 1.5 mg into the skin once a week. 0.5 mL 3   DULoxetine (CYMBALTA) 60 MG capsule Take 1 capsule (60 mg total) by mouth daily. 90 capsule 3   EPINEPHrine 0.3 mg/0.3 mL IJ SOAJ injection Inject 0.3 mg into the muscle as needed for anaphylaxis. 1 each 1   glucose blood (ONETOUCH VERIO) test strip Use to check blood sugar TID. E11.69 100 each 6   insulin aspart (NOVOLOG) 100 UNIT/ML injection Inject 12 Units into the skin 3 (three) times daily before meals. For blood sugars 0-199 give 0 units of insulin, 201-250 give 4 units, 251-300 give 6 units, 301-350 give 8 units, 351-400 give 10 units,> 400 give 12 units and call M.D. (Patient taking differently: Inject 5-10 Units into the skin 3 (three) times daily before meals. For blood sugars 0-199 give 0 units of insulin, 201-250 give 4 units, 251-300 give 6 units, 301-350 give 8 units, 351-400 give 10 units,> 400 give 12 units and call M.D.) 10 mL 3   insulin detemir (LEVEMIR) 100 UNIT/ML injection INJECT 0.4 MLS (40 UNITS TOTAL) INTO THE SKIN 2 (TWO) TIMES DAILY. (Patient taking differently: Inject 50 Units into the skin 2 (two) times daily.) 60 mL 1   Multiple Vitamins-Minerals (MULTIVITAMIN WOMEN PO) Take 1 tablet by mouth daily.     nitroGLYCERIN (NITROSTAT) 0.4 MG SL tablet Place 1 tablet (0.4 mg total) under the tongue every 5 (five) minutes x 3 doses as needed for chest pain. 25 tablet 12   ondansetron (ZOFRAN ODT) 4 MG disintegrating tablet Take 1 tablet (4 mg total) by mouth every 8 (eight) hours as needed for nausea or vomiting. 20 tablet 0   OneTouch Delica Lancets 09N MISC Use to check blood sugar TID.  E11.69 100 each 6   pantoprazole (PROTONIX) 40 MG tablet Take 40 mg by mouth 2 (two) times daily.     potassium chloride SA (KLOR-CON) 20 MEQ tablet Take 2 tablets (40  mEq total) by mouth daily. (Patient taking differently: Take 20 mEq by mouth 2 (two) times daily.) 60 tablet 6   RESTASIS 0.05 % ophthalmic emulsion  Place 1 drop into both eyes 2 (two) times daily.  12   rosuvastatin (CRESTOR) 10 MG tablet Take 1 tablet (10 mg total) by mouth daily. 90 tablet 1   torsemide (DEMADEX) 20 MG tablet TAKE 4 TABLETS BY MOUTH DAILY. 360 tablet 1   metoprolol succinate (TOPROL XL) 25 MG 24 hr tablet Take 1.5 tablets (37.5 mg total) by mouth daily. 30 tablet 11   No current facility-administered medications for this encounter.    Allergies  Allergen Reactions   Sulfa Antibiotics Itching    Social History   Socioeconomic History   Marital status: Significant Other    Spouse name: Not on file   Number of children: 2   Years of education: Not on file   Highest education level: Some college, no degree  Occupational History   Not on file  Tobacco Use   Smoking status: Never   Smokeless tobacco: Never  Vaping Use   Vaping Use: Never used  Substance and Sexual Activity   Alcohol use: No   Drug use: No   Sexual activity: Not Currently  Other Topics Concern   Not on file  Social History Narrative   Pt lives in [place] with spouse.  2 grown children.   Previously worked in [employer] at [employer].  Now on disability   Social Determinants of Health   Financial Resource Strain: Medium Risk   Difficulty of Paying Living Expenses: Somewhat hard  Food Insecurity: No Food Insecurity   Worried About Charity fundraiser in the Last Year: Never true   Ran Out of Food in the Last Year: Never true  Transportation Needs: Unmet Transportation Needs   Lack of Transportation (Medical): Yes   Lack of Transportation (Non-Medical): No  Physical Activity: Not on file  Stress: Not on file  Social Connections: Not on file  Intimate Partner Violence: Not on file    Family History  Problem Relation Age of Onset   Cancer Mother    Hypertension Mother    Cancer Father     CAD Other    Hypertension Sister    Hypertension Brother     ROS- All systems are reviewed and negative except as per the HPI above  Physical Exam: Vitals:   04/26/21 0855  BP: (!) 142/96  Pulse: (!) 106  Weight: 128.5 kg  Height: _0  (1.702 m)   Wt Readings from Last 3 Encounters:  04/26/21 128.5 kg  04/19/21 120.2 kg  04/14/21 123.8 kg    Labs: Lab Results  Component Value Date   NA 144 04/14/2021   K 3.5 04/14/2021   CL 100 04/14/2021   CO2 28 04/14/2021   GLUCOSE 94 04/14/2021   BUN 19 04/14/2021   CREATININE 2.10 (H) 04/14/2021   CALCIUM 9.7 04/14/2021   PHOS 4.0 02/09/2021   MG 1.9 02/28/2021   Lab Results  Component Value Date   INR 2.3 (H) 02/28/2021   Lab Results  Component Value Date   CHOL 140 06/24/2020   HDL 46 06/24/2020   LDLCALC 75 06/24/2020   TRIG 102 06/24/2020     GEN- The patient is well appearing, alert and oriented x 3 today.   Head- normocephalic, atraumatic Eyes-  Sclera clear, conjunctiva pink Ears- hearing intact Oropharynx- clear Neck- supple, no JVP Lymph- no cervical lymphadenopathy Lungs- Clear to ausculation bilaterally, normal work of breathing Heart- irregular rate and rhythm, no murmurs, rubs or gallops, PMI not laterally displaced GI- soft, NT, ND, + BS  Extremities- no clubbing, cyanosis, or edema MS- no significant deformity or atrophy Skin- no rash or lesion Psych- euthymic mood, full affect Neuro- strength and sensation are intact  EKG- probable 2: 1 atrial flutter at 106 bpm.     Assessment and Plan:  1. Persistent atrial fib/flutter  Has had afib for 10+ years  Has failed Tikosyn and amiodarone Maze procedure in May and many cardioversion's since then and afib persists Last CV 8/30 with ERAF, in aflutter today  I discussed with Dr. Rayann Heman as there had been discussion re PPM and AV nodal ablation may be an option Echo pending 9/12 She has an appointment in November to discuss further with Dr.  Rayann Heman but his feeling today is that this would not be helpful unless she is racing and cannot be controlled He does not feel EP has anything else to offer to maintain SR  HR today is 106 bpm He suggested to increase BB I will increase to 37.5 mg daily today  Continue amiodarone at 400 mg bid for now   2. Acute on chronic systolic HF Pt states that her weight is up 18.7 lbs today  I discussed with Allena Katz, NP She suggested to increase torsemide to 80 mg bid (from  80 mg am ) with extra 40 meq K+ in the pm  She will be seen by Janett Billow in the [facility] on Thursday for further diuresis evaluation  Most recent creatinine( 8/25) 2.10 mg/dl   F/u with HF on Thursday   F/u with Dr. Rayann Heman in November   [place]. Anikka Marsan, [facility] [address] [place], [place] [address] [phone]

## 2021-04-26 NOTE — Patient Instructions (Signed)
Increase metoprolol to 1 and 1/2 tablets a day (37.5mg )  Increase torsemide to 80mg  twice a day until follow up Thursday  Increase potassium to 53meq twice a day until follow up Thursday

## 2021-04-27 ENCOUNTER — Ambulatory Visit (HOSPITAL_COMMUNITY): Payer: Medicare Other

## 2021-04-27 ENCOUNTER — Telehealth (HOSPITAL_COMMUNITY): Payer: Self-pay

## 2021-04-27 NOTE — Telephone Encounter (Signed)
Called to confirm/remind patient of their appointment at the [facility] on 04/28/21.   Patient reminded to bring all medications and/or complete list.  Confirmed patient has transportation. Gave directions, instructed to utilize [facility] parking.  Confirmed appointment prior to ending call.

## 2021-04-27 NOTE — Progress Notes (Signed)
Advanced Heart Failure Clinic Note   PCP: [name], NP PCP-Cardiologist: Buford Dresser, MD  [facility]: Dr. 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   HPI: Ms. Woodhams is a 65 y.o. female with morbid obesity, chronic diastolic and systolic HF with mildly reduced LVEF (45-50%), severe MR/TR, atrial fibrillation on eliquis, HTN, and DMII who is followed by Dr. Harrell Gave as an out-patient.   She has a known history of HF with mildly reduced LVEF of 45-50% with cath in 2019 with mild, non-obstructive CAD. Also with history of difficult to control Afib with failed Tikosyn, later switched to amiodarone, dig, metop and apixaban for [facility]. Saw Dr. Harrell Gave in clinic on 11/22/20 where she was experiencing worsening SOB, weight gain, LE edema and chest pressure. She declined ER visit at that time. She was changed from lasix to torsemide for diuresis. Also planned for CV surgery evaluation for severe MR.    Admitted 12/19/20 with worsening HF.  LHC 5/19 non-obstructive CAD. RHC 12/22/20 with elevated filling pressures and low CI.    Underwent MV/TV repair with Maze on 12/30/20. Post-operatively, she struggled  with low co-ox, progressive renal failure and volume overload. AHF team was consulted to assist w/ further management. Post operative Echo showed LVEF 55% RV moderately HK. Moderate TR. No significant MR. No effusion. Hemodynamics improved w/ milrinone. Suspected of having post-op ATN. Nephrology assisted w/ transition back to PO diuretics. She was discharged on 5/28. D/w wt was 281 lb. SCr was 2.8. Nephrology recommended home on torsemide 60 mg daily. She was also in Afib day of d/c. Recommendations were to consider DC-CV in several weeks after atrial irritation settles down from Maze.   She was seen in clinic 01/25/21 for post hospital f/u. ReDs clip 42%. Plan for DCCV.  She was seen in ED 01/29/21 with SOB and CP,  with decreased urinary output. She was admitted for A/CHF, diuresed with IV lasix, RHC showed low filling pressures and moderately reduced CO. She underwent successful DCCV 02/01/21. 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 She was discharged with CR referral and instructions to follow up with nephrology.  Seen in ED 02/07/21 for leg swelling & CP. She was back in Afib, rate controlled. She was loaded w amiodarone gtt, repeat successful DCCV on 02/09/21. 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 If she returned to Afib, would need evaluation for AV node ablation/PPM.   She returned to HF clinic on 02/22/21. She was back in A fib and volume overloaded. Torsemide was increased to 80 mg bid and amio increased to 200 mg bid. (She did not tell staff she was out of amio).    She returned to HF clinic 2 days later and was in A fib RVR with rate in the 130s. SOB with minimal exertion. She was sent to the ED for further evaluation. EP consulted. She converted to NSR on IV amiodarone. SCr trended up and diuretics held. Repeat echo showed new reduction in EF, in setting of AFL with RVR,  EF 30-35%, moderate LVH, moderate RV dysfunction, PASP 48, s/p MV repair with mild MR and mean gradient 10 mmHg with HR around 130, s/p TV repair with mild-moderate TR, dilated IVC. Transitioned to po amio and torsemide, discharge weight 271 lbs.  Hospital f/u 7/22 NYHA II-early III, volume good. Weight 275 lbs.  S/p DCCV 04/19/21, but back  in afib at follow up a week later at [facility] appointment. Weight up 18 lbs, and more SOB. EP felt nothing else to offer her to maintain SR. Beta Blocker increased and amio 400 bid continued. We suggested increasing torsemide to 80 mg bid with close HF follow up.  Today she returns for acute HF visit with complaints of new SOB. She is SOB with any physical activity. Stomach is tight and legs are more swollen.  Dizziness with standing and chest pressure. Symptoms started last Thursday. Denies abnormal bleeding or PND/Orthopnea. Appetite ok. Weight at home 287 pounds. Taking all medications. Her niece died about 4 weeks ago from breast CA, she helped care for her before her death.  Cardiac Studies - Echo (11/13): EF 60-65%, moderate TR - Echo (11/14): EF 60-65%, severe TR, PA peak pressure 47 mmHg - Echo (5/15): EF 50-55% - Exercise stress test (7/6): A. Fib. with controlled vent. response OLD inferior MI and non sp. T wave changes at baseline. - Echo (8/17): EF 50-55%, moderate MR - Exercise stress test (8/17): Atrial Fib. Q wave in lead 3 and AVF with nonsp T wave changes at baseline - Echo (5/19): EF 55-60%, moderate MR - Exercise stress test (5/19): Afib, normal. - LHC (5/19): Prox LAD lesion is 10% stenosed, Dist LM to Ost LAD lesion is 10% stenosed, Ost Cx lesion is 15% stenosed, LV systolic function normal, LVEDP is normal. - Echo (11/19): EF 45-50%, severe MR/TR - Echo (11/21): EF 45-50%, severe MR (rheumatic)/TR - Echo (4/22): EF 45-50%, severe MR mean gradient 2.0 mmHg, severe TR - Echo (5/22): EF 60-65%, s/p MVR, trace MR mean gradient 7.0 mmHg, moderate TR - RHC (5/22): Elevated R > L heart filling pressures with PAPi low but not markedly so (1.8) suggestive of a significant component of RV dysfunction. Prominent v-waves in PCWP and RA pressure tracings suggestive of significant MR and TR. Cardiac index low. - RHC (6/22): low filling pressures, moderate reduced CO. - Echo (7/22): EF 30-35%, moderate LVH, moderate RV dysfunction, PASP 48, s/p MV repair with mild MR and mean gradient 10 mmHg with HR around 130, s/p TV repair with mild-moderate TR, dilated IVC.   RA = 5 RV = 33/2 PA = 35/13 (22) PCW = 13 Fick cardiac output/index = 4.3/2.0 PVR = 2.1 WU FA sat = 97% PA sat = 54%, 54% - DCCV (02/01/21): successful conversion to NSR. - DCCV (02/09/21): successful conversion to NSR.  ROS:  All systems reviewed and negative except as per HPI.   Past Medical History:  Diagnosis Date   Allergic rhinitis    Arthritis    Asthma    Chest pain 12/27/2017   Chronic diastolic CHF (congestive heart failure) (HCC)    COPD (chronic obstructive pulmonary disease) (HCC)    Depression    DM (diabetes mellitus) ([place])    DVT (deep vein thrombosis) in pregnancy    Dysrhythmia    atrial fibrilation   GERD (gastroesophageal reflux disease)    Headache(784.0)    HTN (hypertension)    Hyperlipidemia    Obesity    Pneumonia 04/2018   RIGHT LOBE   Shortness of breath    Sleep apnea    compliant with CPAP   Current Outpatient Medications  Medication Sig Dispense Refill   Accu-Chek Softclix Lancets lancets Use as instructed to check blood sugar three times daily. E11.69 100 each 12   albuterol (VENTOLIN HFA) 108 (90 Base) MCG/ACT inhaler Inhale 2 puffs into the lungs  every 4 (four) hours as needed (Asthma).     Alcohol Swabs (B-D SINGLE USE SWABS REGULAR) PADS 1 each by Other route daily.     ALPRAZolam (XANAX) 0.5 MG tablet Take 1 tablet (0.5 mg total) by mouth 2 (two) times daily as needed for anxiety. 30 tablet 0   amiodarone (PACERONE) 200 MG tablet Take 1 tablet (200 mg total) by mouth 2 (two) times daily. 180 tablet 3   apixaban (ELIQUIS) 5 MG TABS tablet Take 1 tablet (5 mg total) by mouth 2 (two) times daily. 180 tablet 1   B-D ULTRAFINE III SHORT PEN 31G X 8 MM MISC 1 each by Other route in the morning, at noon, in the evening, and at bedtime.     blood glucose meter kit and supplies KIT Dispense based on patient and insurance preference. Use up to four times daily as directed. 1 each 0   Blood Glucose Monitoring Suppl (ACCU-CHEK GUIDE ME) w/Device KIT Use as instructed to check blood sugar three times daily. E11.69 1 kit 0   Blood Glucose Monitoring Suppl (ONETOUCH VERIO) w/Device KIT Use to check blood sugar TID. E11.69 1 kit 0   colchicine 0.6 MG tablet Taking 1/2 tablet by  mouth daily     docusate sodium (COLACE) 250 MG capsule Take 100 mg by mouth daily.     Dulaglutide (TRULICITY) 1.5 ZO/1.0RU SOPN Inject 1.5 mg into the skin once a week. 0.5 mL 3   DULoxetine (CYMBALTA) 60 MG capsule Take 1 capsule (60 mg total) by mouth daily. 90 capsule 3   EPINEPHrine 0.3 mg/0.3 mL IJ SOAJ injection Inject 0.3 mg into the muscle as needed for anaphylaxis. 1 each 1   glucose blood (ONETOUCH VERIO) test strip Use to check blood sugar TID. E11.69 100 each 6   insulin aspart (NOVOLOG) 100 UNIT/ML injection Inject 12 Units into the skin 3 (three) times daily before meals. For blood sugars 0-199 give 0 units of insulin, 201-250 give 4 units, 251-300 give 6 units, 301-350 give 8 units, 351-400 give 10 units,> 400 give 12 units and call M.D. (Patient taking differently: Inject 5-10 Units into the skin 3 (three) times daily before meals. For blood sugars 0-199 give 0 units of insulin, 201-250 give 4 units, 251-300 give 6 units, 301-350 give 8 units, 351-400 give 10 units,> 400 give 12 units and call M.D.) 10 mL 3   insulin detemir (LEVEMIR) 100 UNIT/ML injection INJECT 0.4 MLS (40 UNITS TOTAL) INTO THE SKIN 2 (TWO) TIMES DAILY. (Patient taking differently: Inject 50 Units into the skin 2 (two) times daily.) 60 mL 1   metoprolol succinate (TOPROL XL) 25 MG 24 hr tablet Take 1.5 tablets (37.5 mg total) by mouth daily. 30 tablet 11   Multiple Vitamins-Minerals (MULTIVITAMIN WOMEN PO) Take 1 tablet by mouth daily.     nitroGLYCERIN (NITROSTAT) 0.4 MG SL tablet Place 1 tablet (0.4 mg total) under the tongue every 5 (five) minutes x 3 doses as needed for chest pain. 25 tablet 12   ondansetron (ZOFRAN ODT) 4 MG disintegrating tablet Take 1 tablet (4 mg total) by mouth every 8 (eight) hours as needed for nausea or vomiting. 20 tablet 0   OneTouch Delica Lancets 04V MISC Use to check blood sugar TID.  E11.69 100 each 6   pantoprazole (PROTONIX) 40 MG tablet Take 40 mg by mouth 2 (two) times daily.      potassium chloride SA (KLOR-CON) 20 MEQ tablet Take 2 tablets (40 mEq total)  by mouth daily. 60 tablet 6   RESTASIS 0.05 % ophthalmic emulsion Place 1 drop into both eyes 2 (two) times daily.  12   rosuvastatin (CRESTOR) 10 MG tablet Take 1 tablet (10 mg total) by mouth daily. 90 tablet 1   torsemide (DEMADEX) 20 MG tablet TAKE 4 TABLETS BY MOUTH DAILY. 360 tablet 1   No current facility-administered medications for this encounter.   Allergies  Allergen Reactions   Sulfa Antibiotics Itching   Social History   Socioeconomic History   Marital status: Significant Other    Spouse name: Not on file   Number of children: 2   Years of education: Not on file   Highest education level: Some college, no degree  Occupational History   Not on file  Tobacco Use   Smoking status: Never   Smokeless tobacco: Never  Vaping Use   Vaping Use: Never used  Substance and Sexual Activity   Alcohol use: No   Drug use: No   Sexual activity: Not Currently  Other Topics Concern   Not on file  Social History Narrative   Pt lives in [place] with spouse.  2 grown children.   Previously worked in [employer] at [employer].  Now on disability   Social Determinants of Health   Financial Resource Strain: Medium Risk   Difficulty of Paying Living Expenses: Somewhat hard  Food Insecurity: No Food Insecurity   Worried About Charity fundraiser in the Last Year: Never true   Ran Out of Food in the Last Year: Never true  Transportation Needs: Unmet Transportation Needs   Lack of Transportation (Medical): Yes   Lack of Transportation (Non-Medical): No  Physical Activity: Not on file  Stress: Not on file  Social Connections: Not on file  Intimate Partner Violence: Not on file   Family History  Problem Relation Age of Onset   Cancer Mother    Hypertension Mother    Cancer Father    CAD Other    Hypertension Sister    Hypertension Brother    BP (!) 148/97   Pulse 84   Wt 131.8 kg   SpO2 97%    BMI 45.51 kg/m   Wt Readings from Last 3 Encounters:  04/28/21 131.8 kg  04/26/21 128.5 kg  04/19/21 120.2 kg   PHYSICAL EXAM: General:  NAD. No resp difficult with speaking in full sentences, arrived in [facility] HEENT: Normal Neck: Supple. JVP to ear. Carotids 2+ bilat; no bruits. No lymphadenopathy or thryomegaly appreciated. Cor: PMI nondisplaced. Regular rate & rhythm. No rubs, gallops or murmurs. Lungs: Clear Abdomen: Obese, +tender, +distended. No hepatosplenomegaly. No bruits or masses. Good bowel sounds. Extremities: No cyanosis, clubbing, rash, 2-3+ LE edema (does have chronic bilateral lymphedema) Neuro: Alert & oriented x 3, cranial nerves grossly intact. Moves all 4 extremities w/o difficulty. Affect pleasant.  ECG: SR (personally reviewed with Dr. [name]).  ReDs: 51%  ASSESSMENT & PLAN: 1. Acute on Chronic systolic CHF:  - Prior echo with EF 55%.  - RHC (5/22): elevated R>L filling pressures with PAPi low, suggestive of significant component of RV dysfunction, prominent v-waves in PCWP and RA tracings suggestive of MR/TR, CO low - RHC (6/22) with low filling pressures with moderately reduced CO. - Echo (7/22) admission in setting of AFL with RVR, showed EF 30-35%, moderate LVH, moderate RV dysfunction, PASP 48, s/p MV repair with mild MR and mean gradient 10 mmHg with HR around 130, s/p TV repair with mild-moderate TR, dilated  IVC.  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  - GDMT has been limited by renal function and BP. - Worsening NYHA IIIb. Volume overloaded on exam, ReDs 51%, weight up 20+ lbs. - Will give lasix 80 mg IV + metolazone 5 mg + 40 KCl in clinic now. - Starting tomorrow (04/29/21) she will take metolazone 5 mg daily x 3 days with extra 40 KCl. - Continue torsemide 80 mg daily.  - Continue Toprol 37.5 mg daily. - Consider addition of SGLT2i +/- spiro as renal function allows. - ? Cardiomems candidate. Chest circumference 108 cm. - BMET and BNP now.   2.  Persistent Atrial flutter/fib:  - She has failed Tikosyn and amiodarone. - She had Maze in 5/22 and DCCV in 6/22 & 04/19/21, last of which she had ERAF. - Per EP, likely not candidate for PPM or AV nodal ablation. - She appears to be in SR today on ECG (discussed with Dr. [name]). - Continue Toprol 35.7 mg daily. - Continue amiodarone 400 mg bid. - Continue Eliquis 5 mg bid.   3. CKD stage 3b:  - SCR baseline ~ 2-2.4 - BMET today.  4. Valvular heart disease:  - S/p MV and TV repair in 5/22.  - Echo 02/25/21 with mild-moderate TR.  There is only mild MR, but mean gradient across the valve is 10 mmHg suggesting mitral stenosis.  However, HR was also around 130, so would favor repeat limited echo for MV assessment when HR is lower/in NSR. - Limited echo (02/28/21): improved EF 40-45%, MV mean gradient 7 mmHg (HR 82 bpm), moderate TR.  5. DM2 - Last HGBA1c 7.4 in 6/22 - Consider SGLT2i as gFR allows  6. Obesity Body mass index is 45.51 kg/m.  Personally discussed plan with Dr. [name].   She has voided 700 cc of urine. She will take metolazone 5 mg daily x 3 days with extra 40 KCl, and we will see her back on Monday in clinic. Will repeat labs and Reds at that time.  Allena Katz, FNP-BC 04/28/21  Patient seen and examined with the above-signed Advanced Practice Provider and/or Housestaff. I personally reviewed laboratory data, imaging studies and relevant notes. I independently examined the patient and formulated the important aspects of the plan. I have edited the note to reflect any of my changes or salient points. I have personally discussed the plan with the patient and/or family.  65 y/o woman as above with MV/TV repair in 2/37, systolic HF EF 62-83%, PAF s/p recent DC-CV, morbid obesity and CKD IV.  Weight up about 20 pounds. NYHA IIIB symptoms. Remains in NSR.  General:  Obese woman sitting in clinic. No resp difficulty HEENT: normal Neck: supple. JVP to jaw .  Carotids 2+ bilat; no bruits. No lymphadenopathy or thryomegaly appreciated. Cor: PMI nondisplaced. Regular rate & rhythm. No rubs, gallops or murmurs. Lungs: clear Abdomen: obese soft, nontender, nondistended. No hepatosplenomegaly. No bruits or masses. Good bowel sounds. Extremities: no cyanosis, clubbing, rash, 2+ edema Neuro: alert & orientedx3, cranial nerves grossly intact. moves all 4 extremities w/o difficulty. Affect pleasant  She is volume overloaded. We discussed admission through ER or trial of IV lasix in clinic followed by home metolazone for several days with close f/u in Clinic.   She has chosen the latter. She received 80 IV lasix in clinic + metolazone. With over 700cc out quickly. She will take metolazone daily for next few days and we will see her again on Monday.   Total time spent  45 minutes. Over half that time spent discussing above.   Glori Bickers, MD  10:37 PM

## 2021-04-28 ENCOUNTER — Ambulatory Visit (HOSPITAL_COMMUNITY)
Admission: RE | Admit: 2021-04-28 | Discharge: 2021-04-28 | Disposition: A | Discharge status: Home / Self Care | Payer: Medicare Other | Source: Ambulatory Visit | Attending: Family Medicine | Admitting: Family Medicine

## 2021-04-28 ENCOUNTER — Other Ambulatory Visit: Payer: Self-pay

## 2021-04-28 ENCOUNTER — Encounter (HOSPITAL_COMMUNITY): Payer: Self-pay

## 2021-04-28 VITALS — BP 148/97 | HR 84 | Wt 290.6 lb

## 2021-04-28 DIAGNOSIS — Z7901 Long term (current) use of anticoagulants: Secondary | ICD-10-CM | POA: Insufficient documentation

## 2021-04-28 DIAGNOSIS — Z596 Low income: Secondary | ICD-10-CM | POA: Insufficient documentation

## 2021-04-28 DIAGNOSIS — N184 Chronic kidney disease, stage 4 (severe): Secondary | ICD-10-CM | POA: Diagnosis not present

## 2021-04-28 DIAGNOSIS — I4892 Unspecified atrial flutter: Secondary | ICD-10-CM | POA: Diagnosis not present

## 2021-04-28 DIAGNOSIS — I5042 Chronic combined systolic (congestive) and diastolic (congestive) heart failure: Secondary | ICD-10-CM

## 2021-04-28 DIAGNOSIS — Z952 Presence of prosthetic heart valve: Secondary | ICD-10-CM | POA: Diagnosis not present

## 2021-04-28 DIAGNOSIS — I2510 Atherosclerotic heart disease of native coronary artery without angina pectoris: Secondary | ICD-10-CM | POA: Insufficient documentation

## 2021-04-28 DIAGNOSIS — N1832 Chronic kidney disease, stage 3b: Secondary | ICD-10-CM | POA: Diagnosis not present

## 2021-04-28 DIAGNOSIS — Z794 Long term (current) use of insulin: Secondary | ICD-10-CM | POA: Diagnosis not present

## 2021-04-28 DIAGNOSIS — Z882 Allergy status to sulfonamides status: Secondary | ICD-10-CM | POA: Diagnosis not present

## 2021-04-28 DIAGNOSIS — E1122 Type 2 diabetes mellitus with diabetic chronic kidney disease: Secondary | ICD-10-CM | POA: Insufficient documentation

## 2021-04-28 DIAGNOSIS — I252 Old myocardial infarction: Secondary | ICD-10-CM | POA: Insufficient documentation

## 2021-04-28 DIAGNOSIS — Z6841 Body Mass Index (BMI) 40.0 and over, adult: Secondary | ICD-10-CM | POA: Insufficient documentation

## 2021-04-28 DIAGNOSIS — Z79899 Other long term (current) drug therapy: Secondary | ICD-10-CM | POA: Insufficient documentation

## 2021-04-28 DIAGNOSIS — I130 Hypertensive heart and chronic kidney disease with heart failure and stage 1 through stage 4 chronic kidney disease, or unspecified chronic kidney disease: Secondary | ICD-10-CM | POA: Insufficient documentation

## 2021-04-28 DIAGNOSIS — Z8249 Family history of ischemic heart disease and other diseases of the circulatory system: Secondary | ICD-10-CM | POA: Insufficient documentation

## 2021-04-28 DIAGNOSIS — I480 Paroxysmal atrial fibrillation: Secondary | ICD-10-CM

## 2021-04-28 DIAGNOSIS — I5023 Acute on chronic systolic (congestive) heart failure: Secondary | ICD-10-CM | POA: Diagnosis not present

## 2021-04-28 LAB — BRAIN NATRIURETIC PEPTIDE: B Natriuretic Peptide: 88.7 pg/mL (ref 0.0–100.0)

## 2021-04-28 LAB — CBC
HCT: 32.3 % — ABNORMAL LOW (ref 36.0–46.0)
Hemoglobin: 9.7 g/dL — ABNORMAL LOW (ref 12.0–15.0)
MCH: 28 pg (ref 26.0–34.0)
MCHC: 30 g/dL (ref 30.0–36.0)
MCV: 93.1 fL (ref 80.0–100.0)
Platelets: 160 10*3/uL (ref 150–400)
RBC: 3.47 MIL/uL — ABNORMAL LOW (ref 3.87–5.11)
RDW: 16.5 % — ABNORMAL HIGH (ref 11.5–15.5)
WBC: 6.9 10*3/uL (ref 4.0–10.5)
nRBC: 0 % (ref 0.0–0.2)

## 2021-04-28 LAB — BASIC METABOLIC PANEL
Anion gap: 12 (ref 5–15)
BUN: 33 mg/dL — ABNORMAL HIGH (ref 8–23)
CO2: 25 mmol/L (ref 22–32)
Calcium: 9.1 mg/dL (ref 8.9–10.3)
Chloride: 104 mmol/L (ref 98–111)
Creatinine, Ser: 2.76 mg/dL — ABNORMAL HIGH (ref 0.44–1.00)
GFR, Estimated: 19 mL/min — ABNORMAL LOW (ref >60)
Glucose, Bld: 80 mg/dL (ref 70–99)
Potassium: 3.6 mmol/L (ref 3.5–5.1)
Sodium: 141 mmol/L (ref 135–145)

## 2021-04-28 MED ORDER — POTASSIUM CHLORIDE CRYS ER 20 MEQ PO TBCR: 40.0000 meq | EXTENDED_RELEASE_TABLET | Freq: Every day | ORAL | 6 refills | Status: DC

## 2021-04-28 MED ORDER — METOLAZONE 5 MG PO TABS
5.0000 mg | ORAL_TABLET | Freq: Once | ORAL | Status: AC
  Administered 2021-04-28: 5 mg via ORAL

## 2021-04-28 MED ORDER — POTASSIUM CHLORIDE CRYS ER 20 MEQ PO TBCR
40.0000 meq | EXTENDED_RELEASE_TABLET | Freq: Once | ORAL | Status: AC
  Administered 2021-04-28: 40 meq via ORAL

## 2021-04-28 MED ORDER — FUROSEMIDE 10 MG/ML IJ SOLN
80.0000 mg | Freq: Once | INTRAMUSCULAR | Status: AC
  Administered 2021-04-28: 80 mg via INTRAVENOUS

## 2021-04-28 MED ORDER — METOLAZONE 5 MG PO TABS
5.0000 mg | ORAL_TABLET | ORAL | 1 refills | Status: DC
Start: 2021-04-28 — End: 2021-05-06

## 2021-04-28 NOTE — Patient Instructions (Signed)
START Metolazone 5 mg, one tab daily for 3 days. Then as directed by the CHF CLINIC Add ADDITIONAL 40 meq of potassium with every dose of metolazone  Labs today We will only contact you if something comes back abnormal or we need to make some changes. Otherwise no news is good news!  Your physician recommends that you schedule a follow-up appointment in: 3-4 days  in the Advanced Practitioners (PA/NP) Clinic    Do the following things EVERYDAY: Weigh yourself in the morning before breakfast. Write it down and keep it in a log. Take your medicines as prescribed Eat low salt foods--Limit salt (sodium) to 2000 mg per day.  Stay as active as you can everyday Limit all fluids for the day to less than 2 liters   At the [facility], you and your health needs are our priority. As part of our continuing mission to provide you with exceptional heart care, we have created designated Provider Care Teams. These Care Teams include your primary Cardiologist (physician) and Advanced Practice Providers (APPs- Physician Assistants and Nurse Practitioners) who all work together to provide you with the care you need, when you need it.   You may see any of the following providers on your designated Care Team at your next follow up: Dr Glori Bickers Dr Loralie Champagne Dr Patrice Paradise, NP Lyda Jester, [place] Ginnie Smart Audry Riles, PharmD   Please be sure to bring in all your medications bottles to every appointment.   If you have any questions or concerns before your next appointment please send [place] a message through [place] or call our office at [phone].    TO LEAVE A MESSAGE FOR THE NURSE SELECT OPTION 2, PLEASE LEAVE A MESSAGE INCLUDING: YOUR NAME DATE OF BIRTH CALL BACK NUMBER REASON FOR CALL**this is important as we prioritize the call backs  YOU WILL RECEIVE A CALL BACK THE SAME DAY AS LONG AS YOU CALL BEFORE 4:00 PM

## 2021-04-28 NOTE — Progress Notes (Signed)
ReDS Vest / Clip - 04/28/21 1000       ReDS Vest / Clip   Station Marker C    Ruler Value 30    ReDS Value Range High volume overload    ReDS Actual Value 51

## 2021-04-29 ENCOUNTER — Ambulatory Visit (HOSPITAL_COMMUNITY): Payer: Medicare Other

## 2021-05-02 ENCOUNTER — Encounter (HOSPITAL_COMMUNITY): Payer: Medicare Other

## 2021-05-02 ENCOUNTER — Emergency Department (HOSPITAL_COMMUNITY): Payer: Medicare Other

## 2021-05-02 ENCOUNTER — Telehealth (INDEPENDENT_AMBULATORY_CARE_PROVIDER_SITE_OTHER): Payer: Self-pay | Admitting: Primary Care

## 2021-05-02 ENCOUNTER — Inpatient Hospital Stay (HOSPITAL_COMMUNITY)
Admission: EM | Admit: 2021-05-02 | Discharge: 2021-05-06 | DRG: 683 | Disposition: A | Discharge status: Home / Self Care | Payer: Medicare Other | Attending: Internal Medicine | Admitting: Internal Medicine

## 2021-05-02 ENCOUNTER — Ambulatory Visit (HOSPITAL_COMMUNITY): Payer: Medicare Other

## 2021-05-02 ENCOUNTER — Ambulatory Visit (INDEPENDENT_AMBULATORY_CARE_PROVIDER_SITE_OTHER): Payer: Medicare Other | Admitting: Primary Care

## 2021-05-02 ENCOUNTER — Other Ambulatory Visit: Payer: Self-pay

## 2021-05-02 ENCOUNTER — Observation Stay (HOSPITAL_COMMUNITY): Payer: Medicare Other

## 2021-05-02 DIAGNOSIS — I4892 Unspecified atrial flutter: Secondary | ICD-10-CM

## 2021-05-02 DIAGNOSIS — Z8249 Family history of ischemic heart disease and other diseases of the circulatory system: Secondary | ICD-10-CM

## 2021-05-02 DIAGNOSIS — R0789 Other chest pain: Secondary | ICD-10-CM | POA: Diagnosis not present

## 2021-05-02 DIAGNOSIS — G8929 Other chronic pain: Secondary | ICD-10-CM | POA: Diagnosis present

## 2021-05-02 DIAGNOSIS — M109 Gout, unspecified: Secondary | ICD-10-CM | POA: Diagnosis present

## 2021-05-02 DIAGNOSIS — N1832 Chronic kidney disease, stage 3b: Secondary | ICD-10-CM | POA: Diagnosis not present

## 2021-05-02 DIAGNOSIS — R0902 Hypoxemia: Secondary | ICD-10-CM

## 2021-05-02 DIAGNOSIS — Z23 Encounter for immunization: Secondary | ICD-10-CM

## 2021-05-02 DIAGNOSIS — Z6841 Body Mass Index (BMI) 40.0 and over, adult: Secondary | ICD-10-CM

## 2021-05-02 DIAGNOSIS — Z79899 Other long term (current) drug therapy: Secondary | ICD-10-CM

## 2021-05-02 DIAGNOSIS — F419 Anxiety disorder, unspecified: Secondary | ICD-10-CM | POA: Diagnosis present

## 2021-05-02 DIAGNOSIS — J309 Allergic rhinitis, unspecified: Secondary | ICD-10-CM | POA: Diagnosis present

## 2021-05-02 DIAGNOSIS — E1142 Type 2 diabetes mellitus with diabetic polyneuropathy: Secondary | ICD-10-CM | POA: Diagnosis present

## 2021-05-02 DIAGNOSIS — I130 Hypertensive heart and chronic kidney disease with heart failure and stage 1 through stage 4 chronic kidney disease, or unspecified chronic kidney disease: Secondary | ICD-10-CM | POA: Diagnosis present

## 2021-05-02 DIAGNOSIS — E1122 Type 2 diabetes mellitus with diabetic chronic kidney disease: Secondary | ICD-10-CM | POA: Diagnosis present

## 2021-05-02 DIAGNOSIS — K219 Gastro-esophageal reflux disease without esophagitis: Secondary | ICD-10-CM | POA: Diagnosis present

## 2021-05-02 DIAGNOSIS — E876 Hypokalemia: Secondary | ICD-10-CM | POA: Diagnosis not present

## 2021-05-02 DIAGNOSIS — Z7901 Long term (current) use of anticoagulants: Secondary | ICD-10-CM

## 2021-05-02 DIAGNOSIS — E785 Hyperlipidemia, unspecified: Secondary | ICD-10-CM | POA: Diagnosis present

## 2021-05-02 DIAGNOSIS — Z20822 Contact with and (suspected) exposure to covid-19: Secondary | ICD-10-CM | POA: Diagnosis present

## 2021-05-02 DIAGNOSIS — F32A Depression, unspecified: Secondary | ICD-10-CM | POA: Diagnosis present

## 2021-05-02 DIAGNOSIS — R0602 Shortness of breath: Secondary | ICD-10-CM | POA: Diagnosis not present

## 2021-05-02 DIAGNOSIS — N179 Acute kidney failure, unspecified: Secondary | ICD-10-CM | POA: Diagnosis not present

## 2021-05-02 DIAGNOSIS — I5042 Chronic combined systolic (congestive) and diastolic (congestive) heart failure: Secondary | ICD-10-CM | POA: Diagnosis present

## 2021-05-02 DIAGNOSIS — I5082 Biventricular heart failure: Secondary | ICD-10-CM | POA: Diagnosis present

## 2021-05-02 DIAGNOSIS — Z882 Allergy status to sulfonamides status: Secondary | ICD-10-CM

## 2021-05-02 DIAGNOSIS — Z9049 Acquired absence of other specified parts of digestive tract: Secondary | ICD-10-CM

## 2021-05-02 DIAGNOSIS — Z90710 Acquired absence of both cervix and uterus: Secondary | ICD-10-CM

## 2021-05-02 DIAGNOSIS — N184 Chronic kidney disease, stage 4 (severe): Secondary | ICD-10-CM | POA: Diagnosis present

## 2021-05-02 DIAGNOSIS — E861 Hypovolemia: Secondary | ICD-10-CM | POA: Diagnosis present

## 2021-05-02 DIAGNOSIS — G4733 Obstructive sleep apnea (adult) (pediatric): Secondary | ICD-10-CM | POA: Diagnosis present

## 2021-05-02 DIAGNOSIS — I4819 Other persistent atrial fibrillation: Secondary | ICD-10-CM | POA: Diagnosis present

## 2021-05-02 DIAGNOSIS — Z794 Long term (current) use of insulin: Secondary | ICD-10-CM

## 2021-05-02 DIAGNOSIS — J449 Chronic obstructive pulmonary disease, unspecified: Secondary | ICD-10-CM | POA: Diagnosis present

## 2021-05-02 DIAGNOSIS — R1011 Right upper quadrant pain: Secondary | ICD-10-CM | POA: Diagnosis not present

## 2021-05-02 DIAGNOSIS — I2510 Atherosclerotic heart disease of native coronary artery without angina pectoris: Secondary | ICD-10-CM | POA: Diagnosis present

## 2021-05-02 LAB — COMPREHENSIVE METABOLIC PANEL
ALT: 12 U/L (ref 0–44)
AST: 17 U/L (ref 15–41)
Albumin: 3.6 g/dL (ref 3.5–5.0)
Alkaline Phosphatase: 95 U/L (ref 38–126)
Anion gap: 15 (ref 5–15)
BUN: 44 mg/dL — ABNORMAL HIGH (ref 8–23)
CO2: 33 mmol/L — ABNORMAL HIGH (ref 22–32)
Calcium: 10 mg/dL (ref 8.9–10.3)
Chloride: 92 mmol/L — ABNORMAL LOW (ref 98–111)
Creatinine, Ser: 3.12 mg/dL — ABNORMAL HIGH (ref 0.44–1.00)
GFR, Estimated: 16 mL/min — ABNORMAL LOW (ref >60)
Glucose, Bld: 103 mg/dL — ABNORMAL HIGH (ref 70–99)
Potassium: 3.5 mmol/L (ref 3.5–5.1)
Sodium: 140 mmol/L (ref 135–145)
Total Bilirubin: 0.7 mg/dL (ref 0.3–1.2)
Total Protein: 8.9 g/dL — ABNORMAL HIGH (ref 6.5–8.1)

## 2021-05-02 LAB — URINALYSIS, COMPLETE (UACMP) WITH MICROSCOPIC
Bilirubin Urine: NEGATIVE
Glucose, UA: NEGATIVE mg/dL
Hgb urine dipstick: NEGATIVE
Ketones, ur: NEGATIVE mg/dL
Leukocytes,Ua: NEGATIVE
Nitrite: NEGATIVE
Protein, ur: 30 mg/dL — AB
Specific Gravity, Urine: 1.014 (ref 1.005–1.030)
pH: 6 (ref 5.0–8.0)

## 2021-05-02 LAB — CBC
HCT: 41.3 % (ref 36.0–46.0)
Hemoglobin: 13.1 g/dL (ref 12.0–15.0)
MCH: 27.6 pg (ref 26.0–34.0)
MCHC: 31.7 g/dL (ref 30.0–36.0)
MCV: 87.1 fL (ref 80.0–100.0)
Platelets: 277 10*3/uL (ref 150–400)
RBC: 4.74 MIL/uL (ref 3.87–5.11)
RDW: 15.3 % (ref 11.5–15.5)
WBC: 6.6 10*3/uL (ref 4.0–10.5)
nRBC: 0 % (ref 0.0–0.2)

## 2021-05-02 LAB — TROPONIN I (HIGH SENSITIVITY)
Troponin I (High Sensitivity): 6 ng/L (ref <18)
Troponin I (High Sensitivity): 6 ng/L (ref <18)

## 2021-05-02 LAB — CBG MONITORING, ED
Glucose-Capillary: 118 mg/dL — ABNORMAL HIGH (ref 70–99)
Glucose-Capillary: 97 mg/dL (ref 70–99)

## 2021-05-02 MED ORDER — DOCUSATE SODIUM 100 MG PO CAPS
100.0000 mg | ORAL_CAPSULE | Freq: Every day | ORAL | Status: DC
  Administered 2021-05-02 – 2021-05-06 (×5): 100 mg via ORAL
  Filled 2021-05-02 (×5): qty 1

## 2021-05-02 MED ORDER — POTASSIUM CHLORIDE CRYS ER 20 MEQ PO TBCR
40.0000 meq | EXTENDED_RELEASE_TABLET | Freq: Every day | ORAL | Status: DC
  Administered 2021-05-02 – 2021-05-06 (×5): 40 meq via ORAL
  Filled 2021-05-02 (×5): qty 2

## 2021-05-02 MED ORDER — ROSUVASTATIN CALCIUM 5 MG PO TABS: 10.0000 mg | ORAL_TABLET | Freq: Every day | ORAL | Status: DC

## 2021-05-02 MED ORDER — AMIODARONE HCL 200 MG PO TABS
200.0000 mg | ORAL_TABLET | Freq: Two times a day (BID) | ORAL | Status: DC
  Administered 2021-05-02 – 2021-05-06 (×8): 200 mg via ORAL
  Filled 2021-05-02 (×8): qty 1

## 2021-05-02 MED ORDER — ACETAMINOPHEN 650 MG RE SUPP: 650.0000 mg | Freq: Four times a day (QID) | RECTAL | Status: DC | PRN

## 2021-05-02 MED ORDER — DULOXETINE HCL 60 MG PO CPEP
60.0000 mg | ORAL_CAPSULE | Freq: Every day | ORAL | Status: DC
  Administered 2021-05-03 – 2021-05-06 (×4): 60 mg via ORAL
  Filled 2021-05-02 (×4): qty 1

## 2021-05-02 MED ORDER — ACETAMINOPHEN 325 MG PO TABS
650.0000 mg | ORAL_TABLET | Freq: Four times a day (QID) | ORAL | Status: DC | PRN
  Administered 2021-05-03 – 2021-05-06 (×6): 650 mg via ORAL
  Filled 2021-05-02 (×6): qty 2

## 2021-05-02 MED ORDER — ALBUTEROL SULFATE (2.5 MG/3ML) 0.083% IN NEBU: 3.0000 mL | INHALATION_SOLUTION | RESPIRATORY_TRACT | Status: DC | PRN

## 2021-05-02 MED ORDER — ROSUVASTATIN CALCIUM 5 MG PO TABS
10.0000 mg | ORAL_TABLET | Freq: Every day | ORAL | Status: DC
  Administered 2021-05-03 – 2021-05-06 (×4): 10 mg via ORAL
  Filled 2021-05-02 (×5): qty 2

## 2021-05-02 MED ORDER — METOPROLOL SUCCINATE ER 25 MG PO TB24
37.5000 mg | ORAL_TABLET | Freq: Every day | ORAL | Status: DC
  Administered 2021-05-03 – 2021-05-06 (×4): 37.5 mg via ORAL
  Filled 2021-05-02 (×5): qty 2

## 2021-05-02 MED ORDER — INSULIN ASPART 100 UNIT/ML IJ SOLN
0.0000 [IU] | INTRAMUSCULAR | Status: DC
  Administered 2021-05-03 (×2): 3 [IU] via SUBCUTANEOUS

## 2021-05-02 MED ORDER — DULOXETINE HCL 60 MG PO CPEP: 60.0000 mg | ORAL_CAPSULE | Freq: Every day | ORAL | Status: DC

## 2021-05-02 MED ORDER — APIXABAN 5 MG PO TABS
5.0000 mg | ORAL_TABLET | Freq: Two times a day (BID) | ORAL | Status: DC
  Administered 2021-05-02 – 2021-05-06 (×8): 5 mg via ORAL
  Filled 2021-05-02 (×8): qty 1

## 2021-05-02 MED ORDER — METOPROLOL SUCCINATE ER 25 MG PO TB24: 37.5000 mg | ORAL_TABLET | Freq: Every day | ORAL | Status: DC

## 2021-05-02 MED ORDER — PANTOPRAZOLE SODIUM 40 MG PO TBEC
40.0000 mg | DELAYED_RELEASE_TABLET | Freq: Two times a day (BID) | ORAL | Status: DC
  Administered 2021-05-02 – 2021-05-06 (×8): 40 mg via ORAL
  Filled 2021-05-02 (×8): qty 1

## 2021-05-02 MED ORDER — LACTATED RINGERS IV SOLN: INTRAVENOUS | Status: AC

## 2021-05-02 NOTE — ED Provider Notes (Signed)
[facility] EMERGENCY DEPARTMENT Provider Note   CSN: [id number] Arrival date & time: 05/02/21  1410     History Chief Complaint  Patient presents with   Chest Pain    [name] is a 65 y.o. female.   Chest Pain Associated symptoms: shortness of breath   Associated symptoms: no back pain and no weakness   Patient presents with chest pain.  Anterior chest.  Began yesterday.  History of CHF and atrial fibrillation/flutter.  Recently had increase in her diuretic through both the A. fib and CHF offices.  Appears if she is down around 11 kg over the last few days, however now is chest tightness.  Thinks she went back into atrial fibs/flutter on Saturday with today being Monday.  Developed chest tightness today.  No real relief by nitro with EMS.  Has had a mitral valve replacement.  Had previous cardioversions.  Is on Eliquis.  Has had reassuring heart catheterizations.  Cardiology has seen the patient and plan for what appears to be a medicine admission due to increased creatinine.  Will need to be off diuretics recheck BMP tomorrow.    Past Medical History:  Diagnosis Date   Allergic rhinitis    Arthritis    Asthma    Chest pain 12/27/2017   Chronic diastolic CHF (congestive heart failure) (HCC)    COPD (chronic obstructive pulmonary disease) (HCC)    Depression    DM (diabetes mellitus) ([place])    DVT (deep vein thrombosis) in pregnancy    Dysrhythmia    atrial fibrilation   GERD (gastroesophageal reflux disease)    Headache(784.0)    HTN (hypertension)    Hyperlipidemia    Obesity    Pneumonia 04/2018   RIGHT LOBE   Shortness of breath    Sleep apnea    compliant with CPAP    Patient Active Problem List   Diagnosis Date Noted   Acute on chronic systolic heart failure ([place]) 02/25/2021   Atypical atrial flutter ([place])    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   Acute exacerbation of CHF (congestive heart failure) ([place]) 01/29/2021   S/P  mitral valve repair 12/29/2020   Encounter for preoperative dental examination    Teeth missing    Gingivitis    Accretions on teeth    CHF exacerbation ([place]) 12/21/2020   Acute on chronic heart failure ([place]) 12/19/2020   Atrial fibrillation, permanent ([place]) 11/02/2020   Chronic combined systolic and diastolic heart failure ([place]) 05/06/2020   NICM (nonischemic cardiomyopathy) ([place]) 04/01/2020   Family history of heart disease 04/01/2020   Mitral regurgitation 04/01/2020   Severe tricuspid regurgitation 04/01/2020   Closed right ankle fracture 09/24/2019   DKA, type 2 ([place]) 09/24/2019   HHNC (hyperglycemic hyperosmolar nonketotic coma) ([place]) 02/21/2019   Hypokalemia 02/21/2019   Acute kidney injury superimposed on CKD ([place]) 02/21/2019   Acute on chronic left systolic heart failure ([place]) 07/01/2018   Congestive heart failure with left ventricular diastolic dysfunction, acute ([place]) 07/01/2018   Right upper lobe pneumonia 05/14/2018   Atrial fibrillation with RVR ([place]) 05/14/2018   Chronic atrial fibrillation ([place]) 50/53/9767   Diastolic dysfunction 34/19/3790   Diabetes mellitus type 2 in obese ([place]) 12/26/2017   Nausea and vomiting    Chest pain 02/26/2012   OSA (obstructive sleep apnea) 09/21/2011   Hypoxia 09/21/2011   Diabetes mellitus ([place]) 02/04/2010   Morbid obesity ([place]) 02/04/2010   AF (paroxysmal atrial fibrillation) ([place]) 02/04/2010   Gastroparesis 02/04/2010  Acute gastroenteritis 02/04/2010    Past Surgical History:  Procedure Laterality Date   ABDOMINAL HYSTERECTOMY     BUBBLE STUDY  11/26/2020   Procedure: BUBBLE STUDY;  Surgeon: Buford Dresser, MD;  Location: [place];  Service: Cardiovascular;;   CARDIOVERSION N/A 05/06/2020   Procedure: CARDIOVERSION;  Surgeon: Buford Dresser, MD;  Location: [place];  Service: Cardiovascular;  Laterality: N/A;   CARDIOVERSION N/A 11/04/2020   Procedure: CARDIOVERSION;  Surgeon: Lelon Perla, MD;   Location: [facility] ENDOSCOPY;  Service: Cardiovascular;  Laterality: N/A;   CARDIOVERSION N/A 02/01/2021   Procedure: CARDIOVERSION;  Surgeon: Jolaine Artist, MD;  Location: [facility] ENDOSCOPY;  Service: Cardiovascular;  Laterality: N/A;   CARDIOVERSION N/A 02/09/2021   Procedure: CARDIOVERSION;  Surgeon: Jolaine Artist, MD;  Location: [facility] ENDOSCOPY;  Service: Cardiovascular;  Laterality: N/A;   CARDIOVERSION N/A 04/19/2021   Procedure: CARDIOVERSION;  Surgeon: Fay Records, MD;  Location: [place];  Service: Cardiovascular;  Laterality: N/A;   CATARACT EXTRACTION, BILATERAL     CHOLECYSTECTOMY     COLONOSCOPY WITH PROPOFOL N/A 03/05/2015   Procedure: COLONOSCOPY WITH PROPOFOL;  Surgeon: Carol Ada, MD;  Location: [facility] ENDOSCOPY;  Service: Endoscopy;  Laterality: N/A;   DIAGNOSTIC LAPAROSCOPY     ingrown hallux Left    KNEE SURGERY     LEFT HEART CATH AND CORONARY ANGIOGRAPHY N/A 12/31/2017   Procedure: LEFT HEART CATH AND CORONARY ANGIOGRAPHY;  Surgeon: Charolette Forward, MD;  Location: [place] CV LAB;  Service: Cardiovascular;  Laterality: N/A;   MAZE N/A 12/29/2020   Procedure: MAZE;  Surgeon: [name], MD;  Location: [place];  Service: Open Heart Surgery;  Laterality: N/A;   MITRAL VALVE REPAIR N/A 12/29/2020   Procedure: MITRAL VALVE REPAIR USING CARBOMEDICS ANNULOFLEX RING SIZE 30;  Surgeon: [name], MD;  Location: [place];  Service: Open Heart Surgery;  Laterality: N/A;   RIGHT HEART CATH N/A 12/22/2020   Procedure: RIGHT HEART CATH;  Surgeon: Larey Dresser, MD;  Location: [place] CV LAB;  Service: Cardiovascular;  Laterality: N/A;   RIGHT HEART CATH N/A 01/31/2021   Procedure: RIGHT HEART CATH;  Surgeon: Jolaine Artist, MD;  Location: [place] CV LAB;  Service: Cardiovascular;  Laterality: N/A;   TEE WITHOUT CARDIOVERSION N/A 11/26/2020   Procedure: TRANSESOPHAGEAL ECHOCARDIOGRAM (TEE);  Surgeon: Buford Dresser, MD;  Location: [facility] ENDOSCOPY;   Service: Cardiovascular;  Laterality: N/A;   TEE WITHOUT CARDIOVERSION N/A 12/29/2020   Procedure: TRANSESOPHAGEAL ECHOCARDIOGRAM (TEE);  Surgeon: [name], MD;  Location: [place];  Service: Open Heart Surgery;  Laterality: N/A;   TRICUSPID VALVE REPLACEMENT N/A 12/29/2020   Procedure: 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 RING SIZE 34;  Surgeon: [name], MD;  Location: [place];  Service: Open Heart Surgery;  Laterality: N/A;     OB History   No obstetric history on file.     Family History  Problem Relation Age of Onset   Cancer Mother    Hypertension Mother    Cancer Father    CAD Other    Hypertension Sister    Hypertension Brother     Social History   Tobacco Use   Smoking status: Never   Smokeless tobacco: Never  Vaping Use   Vaping Use: Never used  Substance Use Topics   Alcohol use: No   Drug use: No    Home Medications Prior to Admission medications   Medication Sig Start Date End Date Taking?  Authorizing Provider  nitroGLYCERIN (NITROSTAT) 0.4 MG SL tablet Place 1 tablet (0.4 mg total) under the tongue every 5 (five) minutes x 3 doses as needed for chest pain. 01/08/14  Yes Charolette Forward, MD  Accu-Chek Softclix Lancets lancets Use as instructed to check blood sugar three times daily. E11.69 04/21/20   Charlott Rakes, MD  albuterol (VENTOLIN HFA) 108 (90 Base) MCG/ACT inhaler Inhale 2 puffs into the lungs every 4 (four) hours as needed (Asthma). 08/15/19   [provider]  Alcohol Swabs (B-D SINGLE USE SWABS REGULAR) PADS 1 each by Other route daily. 02/19/19   [provider]  ALPRAZolam Duanne Moron) 0.5 MG tablet Take 1 tablet (0.5 mg total) by mouth 2 (two) times daily as needed for anxiety. 02/15/21   [name], NP  amiodarone (PACERONE) 200 MG tablet Take 1 tablet (200 mg total) by mouth 2 (two) times daily. Patient taking differently: Take 400 mg by mouth 2 (two) times daily. 04/14/21   Shirley Friar, PA-C  apixaban (ELIQUIS) 5 MG TABS tablet Take 1 tablet (5 mg total) by mouth 2 (two) times daily. 11/09/20   Buford Dresser, MD  B-D ULTRAFINE III SHORT PEN 31G X 8 MM MISC 1 each by Other route in the morning, at noon, in the evening, and at bedtime. 11/18/20   [provider]  blood glucose meter kit and supplies KIT Dispense based on patient and insurance preference. Use up to four times daily as directed. 02/10/21   Patrecia Pour, MD  Blood Glucose Monitoring Suppl (ACCU-CHEK GUIDE ME) w/Device KIT Use as instructed to check blood sugar three times daily. E11.69 04/21/20   Charlott Rakes, MD  Blood Glucose Monitoring Suppl (ONETOUCH VERIO) w/Device KIT Use to check blood sugar TID. E11.69 02/04/21   Charlott Rakes, MD  colchicine 0.6 MG tablet Taking 1/2 tablet by mouth daily    [provider]  docusate sodium (COLACE) 250 MG capsule Take 100 mg by mouth daily.    [provider]  Dulaglutide (TRULICITY) 1.5 WU/9.8JX SOPN Inject 1.5 mg into the skin once a week. 03/21/21   [name], NP  DULoxetine (CYMBALTA) 60 MG capsule Take 1 capsule (60 mg total) by mouth daily. 03/21/21   [name], NP  EPINEPHrine 0.3 mg/0.3 mL IJ SOAJ injection Inject 0.3 mg into the muscle as needed for anaphylaxis. 06/25/20   [name], NP  glucose blood (ONETOUCH VERIO) test strip Use to check blood sugar TID. E11.69 02/04/21   Charlott Rakes, MD  insulin aspart (NOVOLOG) 100 UNIT/ML injection Inject 12 Units into the skin 3 (three) times daily before meals. For blood sugars 0-199 give 0 units of insulin, 201-250 give 4 units, 251-300 give 6 units, 301-350 give 8 units, 351-400 give 10 units,> 400 give 12 units and call M.D. Patient taking differently: Inject 5-10 Units into the skin 3 (three) times daily before meals. For blood sugars 0-199 give 0 units of insulin, 201-250 give 4 units, 251-300 give 6 units, 301-350 give 8 units, 351-400 give 10 units,>  400 give 12 units and call M.D. 06/24/20   [name], NP  insulin detemir (LEVEMIR) 100 UNIT/ML injection INJECT 0.4 MLS (40 UNITS TOTAL) INTO THE SKIN 2 (TWO) TIMES DAILY. Patient taking differently: Inject 50 Units into the skin 2 (two) times daily. 03/21/21   [name], NP  metolazone (ZAROXOLYN) 5 MG tablet Take 1 tablet (5 mg total) by mouth as directed. 04/28/21  [place], Janett Billow M, FNP  metoprolol succinate (TOPROL XL) 25 MG 24 hr tablet Take 1.5 tablets (37.5 mg total) by mouth daily. 04/26/21 04/26/22  Sherran Needs, NP  Multiple Vitamins-Minerals (MULTIVITAMIN WOMEN PO) Take 1 tablet by mouth daily.    [provider]  ondansetron (ZOFRAN ODT) 4 MG disintegrating tablet Take 1 tablet (4 mg total) by mouth every 8 (eight) hours as needed for nausea or vomiting. 03/21/21   [name], NP  OneTouch Delica Lancets 01K MISC Use to check blood sugar TID.  E11.69 02/04/21   Charlott Rakes, MD  pantoprazole (PROTONIX) 40 MG tablet Take 40 mg by mouth 2 (two) times daily. 12/27/20   [provider]  potassium chloride SA (KLOR-CON) 20 MEQ tablet Take 2 tablets (40 mEq total) by mouth daily. Take an additional 40 meq with every dose of METOLAZONE 04/28/21   [place], Maricela Bo, FNP  RESTASIS 0.05 % ophthalmic emulsion Place 1 drop into both eyes 2 (two) times daily. 11/19/17   [provider]  rosuvastatin (CRESTOR) 10 MG tablet Take 1 tablet (10 mg total) by mouth daily. 09/24/20   [name], NP  torsemide (DEMADEX) 20 MG tablet TAKE 4 TABLETS BY MOUTH DAILY. 03/02/21   Buford Dresser, MD    Allergies    Sulfa antibiotics  Review of Systems   Review of Systems  Constitutional:  Negative for appetite change.  HENT:  Negative for congestion.   Respiratory:  Positive for shortness of breath.   Cardiovascular:  Positive for chest pain and leg swelling.  Genitourinary:  Negative for flank pain.  Musculoskeletal:  Negative for back pain.   Skin:  Negative for rash.  Neurological:  Negative for weakness.   Physical Exam Updated Vital Signs BP 114/88   Pulse (!) 102   Temp 98.3 F (36.8 C) (Oral)   Resp 18   Ht _0  (1.702 m)   Wt 120.2 kg   SpO2 95%   BMI 41.50 kg/m   Physical Exam Vitals and nursing note reviewed.  Constitutional:      Appearance: She is obese.  Eyes:     Pupils: Pupils are equal, round, and reactive to light.  Cardiovascular:     Rate and Rhythm: Rhythm irregular.  Chest:     Chest wall: Tenderness present.     Comments: Mild anterior chest tenderness. Abdominal:     Tenderness: There is no abdominal tenderness.  Skin:    General: Skin is warm.     Capillary Refill: Capillary refill takes less than 2 seconds.  Neurological:     Mental Status: She is oriented to person, place, and time.    ED Results / Procedures / Treatments   Labs (all labs ordered are listed, but only abnormal results are displayed) Labs Reviewed  COMPREHENSIVE METABOLIC PANEL - Abnormal; Notable for the following components:      Result Value   Chloride 92 (*)    CO2 33 (*)    Glucose, Bld 103 (*)    BUN 44 (*)    Creatinine, Ser 3.12 (*)    Total Protein 8.9 (*)    GFR, Estimated 16 (*)    All other components within normal limits  CBC  TROPONIN I (HIGH SENSITIVITY)  TROPONIN I (HIGH SENSITIVITY)    EKG None  Radiology DG Chest 2 View  Result Date: 05/02/2021 CLINICAL DATA:  Chest pain EXAM: CHEST - 2 VIEW COMPARISON:  Chest x-ray 03/29/2021. FINDINGS: Cardiac silhouette is  enlarged, unchanged. Postsurgical changes are similar to the prior study. The lungs are clear. There is no pleural effusion or pneumothorax. The mediastinal silhouette is within normal limits. There is no pulmonary vascular congestion. No acute fractures are seen. There are surgical clips in the upper abdomen. IMPRESSION: 1. No acute cardiopulmonary process. 2. Stable cardiomegaly. Electronically Signed   By: Ronney Asters M.D.    On: 05/02/2021 15:31    Procedures Procedures   Medications Ordered in ED Medications - No data to display  ED Course  I have reviewed the triage vital signs and the nursing notes.  Pertinent labs & imaging results that were available during my care of the patient were reviewed by me and considered in my medical decision making (see chart for details).    MDM Rules/Calculators/A&P                           Patient with chest pain shortness of breath.  Appears to be in atrial flutter.  Already on anticoagulation but has failed numerous cardioversions.  Has been seen by heart failure with consult already in.  Recommend holding diuretics and rechecking creatinine tomorrow.  Chest pain likely noncardiac.  Reassuring heart cath 3 years ago.  Will discuss with unassigned medicine.  Patient sees [facility] and discussed with the hospitalist and they believe this should be an unassigned admission. Final Clinical Impression(s) / ED Diagnoses Final diagnoses:  Atrial flutter, unspecified type ([place])  AKI (acute kidney injury) [facility])    Rx / [place] Orders ED Discharge Orders     None        Davonna Belling, MD 05/02/21 1802

## 2021-05-02 NOTE — Consult Note (Addendum)
Advanced Heart Failure Team Consult Note   Primary Physician: Grayce Sessions, NP PCP-Cardiologist:  Leslie Red, MD HF MD: [name]   Reason for Consultation: Heart Failure   HPI:    [name] is seen today for evaluation of heart failure  at the request of Leslie Hay NP.   [name] is a 65 year old with a history of [name] is a 65 y.o. female with morbid obesity, chronic diastolic and systolic HF with mildly reduced LVEF (45-50%), severe MR/TR, atrial fibrillation on eliquis, HTN, and DMII who is followed by Leslie. Cristal Gallagher as an out-patient.    She has a known history of HF with mildly reduced LVEF of 45-50% with cath in 2019 with mild, non-obstructive CAD. Also with history of difficult to control Afib with failed Tikosyn, later switched to amiodarone, dig, metop and apixaban for [facility]. Saw Leslie. Cristal Gallagher in clinic on 11/22/20 where she was experiencing worsening SOB, weight gain, LE edema and chest pressure. She declined ER visit at that time. She was changed from lasix to torsemide for diuresis. Also planned for CV surgery evaluation for severe MR.    Admitted 12/19/20 with worsening HF.  LHC 5/19 non-obstructive CAD. RHC 12/22/20 with elevated filling pressures and low CI.    Underwent MV/TV repair with Maze on 12/30/20. Post-operatively, she struggled  with low co-ox, progressive renal failure and volume overload. AHF team was consulted to assist w/ further management. Post operative Echo showed LVEF 55% RV moderately HK. Moderate TR. No significant MR. No effusion. Hemodynamics improved w/ milrinone. Suspected of having post-op ATN. Nephrology assisted w/ transition back to PO diuretics. She was discharged on 5/28. D/w wt was 281 lb. SCr was 2.8. Nephrology recommended home on torsemide 60 mg daily. She was also in Afib day of d/c. Recommendations were to consider DC-CV in several weeks after atrial irritation settles down from Maze.    She was seen in clinic 01/25/21 for post hospital f/u. ReDs clip 42%. Plan for DCCV.   She was seen in ED 01/29/21 with SOB and CP, with decreased urinary output. She was admitted for A/CHF, diuresed with IV lasix, RHC showed low filling pressures and moderately reduced CO. She underwent successful DCCV 02/01/21. 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 She was discharged with CR referral and instructions to follow up with nephrology.   Seen in ED 02/07/21 for leg swelling & CP. She was back in Afib, rate controlled. She was loaded w amiodarone gtt, repeat successful DCCV on 02/09/21. 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 If she returned to Afib, would need evaluation for AV node ablation/PPM.   She returned to HF clinic on 02/22/21. She was back in A fib and volume overloaded. Torsemide was increased to 80 mg bid and amio increased to 200 mg bid. (She did not tell staff she was out of amio).    She returned to HF clinic 2 days later and was in A fib RVR with rate in the 130s. SOB with minimal exertion. She was sent to the ED for further evaluation. EP consulted. She converted to NSR on IV amiodarone. SCr trended up and diuretics held. Repeat echo showed new reduction in EF, in setting of AFL with RVR,  EF 30-35%, moderate LVH, moderate RV dysfunction, PASP 48, s/p MV repair with mild MR and mean gradient 10 mmHg with HR around 130, s/p TV repair with  mild-moderate TR, dilated IVC. Transitioned to po amio and torsemide, discharge weight 271 lbs.   Hospital f/u 7/22 NYHA II-early III, volume good. Weight 275 lbs.   S/p DCCV 04/19/21, but back in afib at follow up a week later at [facility] appointment. Weight up 18 lbs, and more SOB. EP felt nothing else to offer her to maintain SR. Beta Blocker increased and amio 400 bid continued. We suggested increasing torsemide to 80 mg bid with close HF follow up.  She was seen in  the HF clinic on 04/28/21. Volume overloaded and given 80 mg IV lasix + 5 mg metolazone in the clinic with good response. She was then instructed 5 mg daily x3 day with 80 mg torsemide. EKG during the visit showed SR. Cardiomems was also considered.   Today she was going to see PCP and had chest pain. Says she started having chest pain yesterday. PCP saw her in the parking lot and she was complaining left arm/chest pain. Says she has not been around anyone sick. PCP called 911. EMS gave 325 mg aspirin and transported to [facility].   CXR -clear with cardiomegaly, no pulmonary edema. Pertinent labs included: HS Trop 6, Creatinine 3.1, Hgb 13, K 3.5, WBC 6.6.  EKG shows A flutter.    Cardiac Studies - Echo (11/13): EF 60-65%, moderate TR - Echo (11/14): EF 60-65%, severe TR, PA peak pressure 47 mmHg - Echo (5/15): EF 50-55% - Exercise stress test (7/6): A. Fib. with controlled vent. response OLD inferior MI and non sp. T wave changes at baseline. - Echo (8/17): EF 50-55%, moderate MR - Exercise stress test (8/17): Atrial Fib. Q wave in lead 3 and AVF with nonsp T wave changes at baseline - Echo (5/19): EF 55-60%, moderate MR - Exercise stress test (5/19): Afib, normal. - LHC (5/19): Prox LAD lesion is 10% stenosed, Dist LM to Ost LAD lesion is 10% stenosed, Ost Cx lesion is 15% stenosed, LV systolic function normal, LVEDP is normal. - Echo (11/19): EF 45-50%, severe MR/TR - Echo (11/21): EF 45-50%, severe MR (rheumatic)/TR - Echo (4/22): EF 45-50%, severe MR mean gradient 2.0 mmHg, severe TR - Echo (5/22): EF 60-65%, s/p MVR, trace MR mean gradient 7.0 mmHg, moderate TR - RHC (5/22): Elevated R > L heart filling pressures with PAPi low but not markedly so (1.8) suggestive of a significant component of RV dysfunction. Prominent v-waves in PCWP and RA pressure tracings suggestive of significant MR and TR. Cardiac index low. - RHC (6/22): low filling pressures, moderate reduced CO. - Echo (7/22): EF  30-35%, moderate LVH, moderate RV dysfunction, PASP 48, s/p MV repair with mild MR and mean gradient 10 mmHg with HR around 130, s/p TV repair with mild-moderate TR, dilated IVC.   RHC 01/31/21  RA = 5 RV = 33/2 PA = 35/13 (22) PCW = 13 Fick cardiac output/index = 4.3/2.0 PVR = 2.1 WU FA sat = 97% PA sat = 54%, 54% - DCCV (02/01/21): successful conversion to NSR. - DCCV (02/09/21): successful conversion to NSR. -DCCV (830/22): successful conversion to NSR.  Review of Systems: [y] = yes, [ ]  = no   General: Weight gain [ ] ; Weight loss [ ] ; Anorexia [ ] ; Fatigue [ Y]; Fever [ ] ; Chills [ ] ; Weakness [ Y]  Cardiac: Chest pain/pressure [ ] ; Resting SOB [ ] ; Exertional SOB [ Y]; Orthopnea [ ] ; Pedal Edema [ Y]; Palpitations [ ] ; Syncope [ ] ; Presyncope [ ] ; Paroxysmal nocturnal dyspnea[ ]   Pulmonary: Cough [ ] ; Wheezing[ ] ; Hemoptysis[ ] ; Sputum [ ] ; Snoring [ ]   GI: Vomiting[ ] ; Dysphagia[ ] ; Melena[ ] ; Hematochezia [ ] ; Heartburn[ ] ; Abdominal pain [ ] ; Constipation [ ] ; Diarrhea [ ] ; BRBPR [ ]   GU: Hematuria[ ] ; Dysuria [ ] ; Nocturia[ ]   Vascular: Pain in legs with walking [ ] ; Pain in feet with lying flat [ ] ; Non-healing sores [ ] ; Stroke [ ] ; TIA [ ] ; Slurred speech [ ] ;  Neuro: Headaches[ ] ; Vertigo[ ] ; Seizures[ ] ; Paresthesias[ ] ;Blurred vision [ ] ; Diplopia [ ] ; Vision changes [ ]   Ortho/Skin: Arthritis [ ] ; Joint pain [ ] ; Muscle pain [ ] ; Joint swelling [ ] ; Back Pain [Y ]; Rash [ ]   Psych: Depression[ ] ; Anxiety[ ]   Heme: Bleeding problems [ ] ; Clotting disorders [ ] ; Anemia [ ]   Endocrine: Diabetes [ Y]; Thyroid dysfunction[ ]   Home Medications Prior to Admission medications   Medication Sig Start Date End Date Taking? Authorizing Provider  Accu-Chek Softclix Lancets lancets Use as instructed to check blood sugar three times daily. E11.69 04/21/20   Charlott Rakes, MD  albuterol (VENTOLIN HFA) 108 (90 Base) MCG/ACT inhaler Inhale 2 puffs into the lungs every 4 (four) hours as  needed (Asthma). 08/15/19   [provider]  Alcohol Swabs (B-D SINGLE USE SWABS REGULAR) PADS 1 each by Other route daily. 02/19/19   [provider]  ALPRAZolam Duanne Moron) 0.5 MG tablet Take 1 tablet (0.5 mg total) by mouth 2 (two) times daily as needed for anxiety. 02/15/21   [name], NP  amiodarone (PACERONE) 200 MG tablet Take 1 tablet (200 mg total) by mouth 2 (two) times daily. Patient taking differently: Take 400 mg by mouth 2 (two) times daily. 04/14/21   Shirley Friar, PA-C  apixaban (ELIQUIS) 5 MG TABS tablet Take 1 tablet (5 mg total) by mouth 2 (two) times daily. 11/09/20   Buford Dresser, MD  B-D ULTRAFINE III SHORT PEN 31G X 8 MM MISC 1 each by Other route in the morning, at noon, in the evening, and at bedtime. 11/18/20   [provider]  blood glucose meter kit and supplies KIT Dispense based on patient and insurance preference. Use up to four times daily as directed. 02/10/21   Patrecia Pour, MD  Blood Glucose Monitoring Suppl (ACCU-CHEK GUIDE ME) w/Device KIT Use as instructed to check blood sugar three times daily. E11.69 04/21/20   Charlott Rakes, MD  Blood Glucose Monitoring Suppl (ONETOUCH VERIO) w/Device KIT Use to check blood sugar TID. E11.69 02/04/21   Charlott Rakes, MD  colchicine 0.6 MG tablet Taking 1/2 tablet by mouth daily    [provider]  docusate sodium (COLACE) 250 MG capsule Take 100 mg by mouth daily.    [provider]  Dulaglutide (TRULICITY) 1.5 OZ/2.2QM SOPN Inject 1.5 mg into the skin once a week. 03/21/21   [name], NP  DULoxetine (CYMBALTA) 60 MG capsule Take 1 capsule (60 mg total) by mouth daily. 03/21/21   [name], NP  EPINEPHrine 0.3 mg/0.3 mL IJ SOAJ injection Inject 0.3 mg into the muscle as needed for anaphylaxis. 06/25/20   [name], NP  glucose blood (ONETOUCH VERIO) test strip Use to check blood sugar TID. E11.69 02/04/21   Charlott Rakes, MD  insulin  aspart (NOVOLOG) 100 UNIT/ML injection Inject 12 Units into the skin 3 (three) times daily before meals. For blood sugars 0-199 give 0 units of insulin, 201-250 give  4 units, 251-300 give 6 units, 301-350 give 8 units, 351-400 give 10 units,> 400 give 12 units and call M.D. Patient taking differently: Inject 5-10 Units into the skin 3 (three) times daily before meals. For blood sugars 0-199 give 0 units of insulin, 201-250 give 4 units, 251-300 give 6 units, 301-350 give 8 units, 351-400 give 10 units,> 400 give 12 units and call M.D. 06/24/20   [name], NP  insulin detemir (LEVEMIR) 100 UNIT/ML injection INJECT 0.4 MLS (40 UNITS TOTAL) INTO THE SKIN 2 (TWO) TIMES DAILY. Patient taking differently: Inject 50 Units into the skin 2 (two) times daily. 03/21/21   [name], NP  metolazone (ZAROXOLYN) 5 MG tablet Take 1 tablet (5 mg total) by mouth as directed. 04/28/21   Milford, Maricela Bo, FNP  metoprolol succinate (TOPROL XL) 25 MG 24 hr tablet Take 1.5 tablets (37.5 mg total) by mouth daily. 04/26/21 04/26/22  Sherran Needs, NP  Multiple Vitamins-Minerals (MULTIVITAMIN WOMEN PO) Take 1 tablet by mouth daily.    [provider]  nitroGLYCERIN (NITROSTAT) 0.4 MG SL tablet Place 1 tablet (0.4 mg total) under the tongue every 5 (five) minutes x 3 doses as needed for chest pain. 01/08/14   Charolette Forward, MD  ondansetron (ZOFRAN ODT) 4 MG disintegrating tablet Take 1 tablet (4 mg total) by mouth every 8 (eight) hours as needed for nausea or vomiting. 03/21/21   [name], NP  OneTouch Delica Lancets 01V MISC Use to check blood sugar TID.  E11.69 02/04/21   Charlott Rakes, MD  pantoprazole (PROTONIX) 40 MG tablet Take 40 mg by mouth 2 (two) times daily. 12/27/20   [provider]  potassium chloride SA (KLOR-CON) 20 MEQ tablet Take 2 tablets (40 mEq total) by mouth daily. Take an additional 40 meq with every dose of METOLAZONE 04/28/21   Milford, Maricela Bo, FNP  RESTASIS  0.05 % ophthalmic emulsion Place 1 drop into both eyes 2 (two) times daily. 11/19/17   [provider]  rosuvastatin (CRESTOR) 10 MG tablet Take 1 tablet (10 mg total) by mouth daily. 09/24/20   [name], NP  torsemide (DEMADEX) 20 MG tablet TAKE 4 TABLETS BY MOUTH DAILY. 03/02/21   Buford Dresser, MD    Past Medical History: Past Medical History:  Diagnosis Date   Allergic rhinitis    Arthritis    Asthma    Chest pain 12/27/2017   Chronic diastolic CHF (congestive heart failure) (HCC)    COPD (chronic obstructive pulmonary disease) (HCC)    Depression    DM (diabetes mellitus) ([place])    DVT (deep vein thrombosis) in pregnancy    Dysrhythmia    atrial fibrilation   GERD (gastroesophageal reflux disease)    Headache(784.0)    HTN (hypertension)    Hyperlipidemia    Obesity    Pneumonia 04/2018   RIGHT LOBE   Shortness of breath    Sleep apnea    compliant with CPAP    Past Surgical History: Past Surgical History:  Procedure Laterality Date   ABDOMINAL HYSTERECTOMY     BUBBLE STUDY  11/26/2020   Procedure: BUBBLE STUDY;  Surgeon: Buford Dresser, MD;  Location: [place];  Service: Cardiovascular;;   CARDIOVERSION N/A 05/06/2020   Procedure: CARDIOVERSION;  Surgeon: Buford Dresser, MD;  Location: [facility] ENDOSCOPY;  Service: Cardiovascular;  Laterality: N/A;   CARDIOVERSION N/A 11/04/2020   Procedure: CARDIOVERSION;  Surgeon: Lelon Perla, MD;  Location: [place];  Service: Cardiovascular;  Laterality: N/A;   CARDIOVERSION N/A 02/01/2021   Procedure: CARDIOVERSION;  Surgeon: Jolaine Artist, MD;  Location: [facility] ENDOSCOPY;  Service: Cardiovascular;  Laterality: N/A;   CARDIOVERSION N/A 02/09/2021   Procedure: CARDIOVERSION;  Surgeon: Jolaine Artist, MD;  Location: [facility] ENDOSCOPY;  Service: Cardiovascular;  Laterality: N/A;   CARDIOVERSION N/A 04/19/2021   Procedure: CARDIOVERSION;  Surgeon: Fay Records, MD;  Location: [facility]  ENDOSCOPY;  Service: Cardiovascular;  Laterality: N/A;   CATARACT EXTRACTION, BILATERAL     CHOLECYSTECTOMY     COLONOSCOPY WITH PROPOFOL N/A 03/05/2015   Procedure: COLONOSCOPY WITH PROPOFOL;  Surgeon: Carol Ada, MD;  Location: [facility] ENDOSCOPY;  Service: Endoscopy;  Laterality: N/A;   DIAGNOSTIC LAPAROSCOPY     ingrown hallux Left    KNEE SURGERY     LEFT HEART CATH AND CORONARY ANGIOGRAPHY N/A 12/31/2017   Procedure: LEFT HEART CATH AND CORONARY ANGIOGRAPHY;  Surgeon: Charolette Forward, MD;  Location: [place] CV LAB;  Service: Cardiovascular;  Laterality: N/A;   MAZE N/A 12/29/2020   Procedure: MAZE;  Surgeon: [name], MD;  Location: [place];  Service: Open Heart Surgery;  Laterality: N/A;   MITRAL VALVE REPAIR N/A 12/29/2020   Procedure: MITRAL VALVE REPAIR USING CARBOMEDICS ANNULOFLEX RING SIZE 30;  Surgeon: [name], MD;  Location: [place];  Service: Open Heart Surgery;  Laterality: N/A;   RIGHT HEART CATH N/A 12/22/2020   Procedure: RIGHT HEART CATH;  Surgeon: Larey Dresser, MD;  Location: [place] CV LAB;  Service: Cardiovascular;  Laterality: N/A;   RIGHT HEART CATH N/A 01/31/2021   Procedure: RIGHT HEART CATH;  Surgeon: Jolaine Artist, MD;  Location: [place] CV LAB;  Service: Cardiovascular;  Laterality: N/A;   TEE WITHOUT CARDIOVERSION N/A 11/26/2020   Procedure: TRANSESOPHAGEAL ECHOCARDIOGRAM (TEE);  Surgeon: Buford Dresser, MD;  Location: [facility] ENDOSCOPY;  Service: Cardiovascular;  Laterality: N/A;   TEE WITHOUT CARDIOVERSION N/A 12/29/2020   Procedure: TRANSESOPHAGEAL ECHOCARDIOGRAM (TEE);  Surgeon: [name], MD;  Location: [place];  Service: Open Heart Surgery;  Laterality: N/A;   TRICUSPID VALVE REPLACEMENT N/A 12/29/2020   Procedure: 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 RING SIZE 34;  Surgeon: [name], MD;  Location: [place];  Service: Open Heart Surgery;  Laterality: N/A;    Family History: Family History   Problem Relation Age of Onset   Cancer Mother    Hypertension Mother    Cancer Father    CAD Other    Hypertension Sister    Hypertension Brother     Social History: Social History   Socioeconomic History   Marital status: Significant Other    Spouse name: Not on file   Number of children: 2   Years of education: Not on file   Highest education level: Some college, no degree  Occupational History   Not on file  Tobacco Use   Smoking status: Never   Smokeless tobacco: Never  Vaping Use   Vaping Use: Never used  Substance and Sexual Activity   Alcohol use: No   Drug use: No   Sexual activity: Not Currently  Other Topics Concern   Not on file  Social History Narrative   Pt lives in [place] with spouse.  2 grown children.   Previously worked in [employer] at [employer].  Now on disability   Social Determinants of Health   Financial Resource Strain: Medium Risk   Difficulty of Paying Living Expenses: Somewhat hard  Food  Insecurity: No Food Insecurity   Worried About Charity fundraiser in the Last Year: Never true   Ran Out of Food in the Last Year: Never true  Transportation Needs: Unmet Transportation Needs   Lack of Transportation (Medical): Yes   Lack of Transportation (Non-Medical): No  Physical Activity: Not on file  Stress: Not on file  Social Connections: Not on file    Allergies:  Allergies  Allergen Reactions   Sulfa Antibiotics Itching    Objective:    Vital Signs:   Temp:  [98.3 F (36.8 C)] 98.3 F (36.8 C) (09/12 1423) Pulse Rate:  [92] 92 (09/12 1423) Resp:  [16] 16 (09/12 1423) BP: (112)/(74) 112/74 (09/12 1423) SpO2:  [96 %] 96 % (09/12 1423) Weight:  [120.2 kg] 120.2 kg (09/12 1418)    Weight change: Filed Weights   05/02/21 1418  Weight: 120.2 kg    Intake/Output:  No intake or output data in the 24 hours ending 05/02/21 1529    Physical Exam    General:  Sitting in the wheel chair. No resp difficulty HEENT: normal Neck:  supple. JVP does not appear elevated. . Carotids 2+ bilat; no bruits. No lymphadenopathy or thyromegaly appreciated. Cor: PMI nondisplaced. Regular rate & rhythm. No rubs, gallops or murmurs. Lungs: clear Abdomen: obese, soft, nontender, nondistended. No hepatosplenomegaly. No bruits or masses. Good bowel sounds. Extremities: no cyanosis, clubbing, rash, R and LLE trace edema Neuro: alert & orientedx3, cranial nerves grossly intact. moves all 4 extremities w/o difficulty. Affect pleasant  Telemetry   Not currently on the monitor   EKG    A flutter 101 bpm   Labs   Basic Metabolic Panel: Recent Labs  Lab 04/28/21 1059  NA 141  K 3.6  CL 104  CO2 25  GLUCOSE 80  BUN 33*  CREATININE 2.76*  CALCIUM 9.1    Liver Function Tests: No results for input(s): AST, ALT, ALKPHOS, BILITOT, PROT, ALBUMIN in the last 168 hours. No results for input(s): LIPASE, AMYLASE in the last 168 hours. No results for input(s): AMMONIA in the last 168 hours.  CBC: Recent Labs  Lab 04/28/21 1059  WBC 6.9  HGB 9.7*  HCT 32.3*  MCV 93.1  PLT 160    Cardiac Enzymes: No results for input(s): CKTOTAL, CKMB, CKMBINDEX, TROPONINI in the last 168 hours.  BNP: BNP (last 3 results) Recent Labs    02/23/21 1011 02/25/21 0909 04/28/21 1059  BNP 204.3* 162.7* 88.7    ProBNP (last 3 results) No results for input(s): PROBNP in the last 8760 hours.   CBG: No results for input(s): GLUCAP in the last 168 hours.  Coagulation Studies: No results for input(s): LABPROT, INR in the last 72 hours.   Imaging   No results found.   Medications:     Current Medications:   Infusions:     Patient Profile   65 y/o woman as above with MV/TV repair in 3/47, systolic HF EF 42-59%, PAF s/p recent DC-CV, morbid obesity and CKD IV.  Presented with chest pain and AKI.  Assessment/Plan   Chest Pain Previously had cath in 2019 - Prox LAD lesion is 10% stenosed, Dist LM to Ost LAD lesion is  10% stenosed, Ost Cx lesion is 15% stenosed, HS Trop 6. EKG with no ischemic changes. Does not appear to be cardiac in nature.   2. AKI on CKD Stage IIIb.  Baseline creatinine 2.1-2.4  Creatinine trending up 2.1>2.7>3.1  Hold diuretics today and tomorrow.  Check BMET in am.  3.PAF Has had multiple cardioversion with most recent cardioversion 04/19/21. She was back in NSR but today she is in atrial flutter with controlled rate. In the community followed by EP She has failed Tikosyn and amiodarone.She had Maze in 5/22 and DCCV in 6/22 & 04/19/21, last of which she had ERAF. - Per EP, likely not candidate for PPM or AV nodal ablation Continue eliquis 5 mg twice a day + Toprol 37.5 mg twice a day + amio 200 mg twice a day .   4. Chronic Systolic Heart Failure RHC (5/22): elevated R>L filling pressures with PAPi low, suggestive of significant component of RV dysfunction, prominent v-waves in PCWP and RA tracings suggestive of MR/TR, CO low - RHC (6/22) with low filling pressures with moderately reduced CO. - Echo (7/22) admission in setting of AFL with RVR, showed EF 30-35%, moderate LVH, moderate RV dysfunction, PASP 48, s/p MV repair with mild MR and mean gradient 10 mmHg with HR around 130, s/p TV repair with mild-moderate TR, dilated IVC.  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  On 9/8 she was given IV lasix + metolazone then had metolazone 5 mg x3 days with torsemide 80 mg daily.  Now with AKI. She  does not appear volume overloaded. Would hold torsemide and restart 05/04/21.  GDMT limited by CKD.   5. Valvular Heart Disease S/p MV and TV repair in 5/22.  - Echo 02/25/21 with mild-moderate TR.  There is only mild MR, but mean gradient across the valve is 10 mmHg suggesting mitral stenosis  6. Obesity Body mass index is 41.5 kg/m.  7. DMII 01/2021 Last Hgb A1C 7.4     Length of Stay: 0  Amy Clegg, NP  05/02/2021, 3:29 PM  Advanced Heart Failure Team Pager [phone] (M-F; 7a - 5p)   Please contact [facility] for night-coverage after hours (4p -7a ) and weekends on [email or web address]  Patient seen and examined with the above-signed Advanced Practice Provider and/or Housestaff. I personally reviewed laboratory data, imaging studies and relevant notes. I independently examined the patient and formulated the important aspects of the plan. I have edited the note to reflect any of my changes or salient points. I have personally discussed the plan with the patient and/or family.  65 y/o woman with morbid obesity, CKD IV, valvular heart disease s/p MVR/TVR 5/22, PAFL and chronic systolic HF EF 27-74%.   She has struggled since her valve replacement in 5/22 with chronic SOB and PAFL. Has undergone DC-CV several times without much improvement. RHC in 6/22 with low filling pressures and moderately reduced CO.  Had cath 5/22 with minimal CAD  Now presents with CP radiating to her shoulders and SOB.   CXR clear. Hs trop 6 x 2. Recent BNP 88. Scr up to 3.1 ECG with recurrent atypical flutter (rate controlled)  General:  Fatigued appearing. No resp difficulty HEENT: normal Neck: supple. no JVD. Carotids 2+ bilat; no bruits. No lymphadenopathy or thryomegaly appreciated. Cor: PMI nondisplaced. Irregular rate & rhythm. No rubs, gallops or murmurs. Lungs: clear Abdomen: obese soft, nontender, nondistended. No hepatosplenomegaly. No bruits or masses. Good bowel sounds. Extremities: no cyanosis, clubbing, rash, edema Neuro: alert & orientedx3, cranial nerves grossly intact. moves all 4 extremities w/o difficulty. Affect pleasant  Doubt CP is cardiac given minimal CAD on recent cath and low troponin. On exam and by labs she appears dry and not volume overloaded. She is back in AFL but has not had a significant  improvement in her symptoms with DC-CV in past.   For now would hydrate gently and follow creatinine. Suspect some of her symptoms may be related to anxiety due to her declining health.  At this point not much else to offer from cardiac standpoint. If she is felt to need admission would need to be admitted to Hospitalist or Teaching service.   Glori Bickers, MD  6:21 PM

## 2021-05-02 NOTE — Progress Notes (Signed)
[name] is  a obese Female, 65 y.o. present to her PCP at 1:12 appointment 1:30 saw PCP going into office - (she is still in her car- passenger) Patient states [name] I do not feel well. Gave 4 81mg  of ASA asked to chew and a couple of sips of water. She was having  Chest pain, Pain radiating from the left side of her neck down to her arm,  shortness of breath, headaches, and diaphoretic. I stated you needed to go to the ED not to your  PCP appointment . PCP called 911 vs EKG- ok with not ST elevation but showed Afib. VS 127/79, pulse initially 64 but increased to 100. O2 on room air 94-100. Notified Dr. Purnell Shoemaker of this event will follow at hospital. Juluis Mire, NP

## 2021-05-02 NOTE — ED Triage Notes (Addendum)
Pt arrived by EMS from home complaining of left sided chest pain that radiates to her left arm, feeling lightheaded with mild N/V 8/10 CP, after 1 nitro pain is 7/10. Medics also gave 324 aspirin  12 lead Afib per EMS  CBG 120 per EMS     Hx of valve replacement May 2022, A.fib and CHF  Was caridovetered 2wks ago for Afib RVR

## 2021-05-02 NOTE — H&P (Addendum)
Date: 05/02/2021               Patient Name:  [name] MRN: [id number]  DOB: August 27, 1955 Age / Sex: 65 y.o., female   PCP: [name], NP         Medical Service: Internal Medicine Teaching Service         Attending Physician: Dr. Velna Ochs, MD    First Contact: Farrel Gordon, DO Pager: ED [phone]  Second Contact: Rick Duff MD Pager: [phone]       After Hours (After 5p/  First Contact Pager: [phone]  weekends / holidays): Second Contact Pager: [phone]   SUBJECTIVE   Chief Complaint: Chest Pressure  History of Present Illness:   [name] is a 65 y/o F with a PMHx of obesity, chronic diastolic and systolic HF with mildly reduced LVEF (45-50%), severe MR/TR, atrial fibrillation on eliquis, HTN, DMII, HLD, asthma, gout, and OSA who is coming in for chest pain and pressure. Started feeling poorly 2 days ago, with poor appetite. She also noted central chest pain/pressure that radiated down her left arm. The chest pain started in the middle of the day while the patient was sitting doing no exertional activity. Describes the pain as hurting pain/someone sitting on chest. Nothing makes the pain better. The chest pain is persistent but varies in intensity while the left arm pain comes and goes. Denies chest pain worsening with deep breaths. She endorses similar pain in the past. . Associated symptoms of increased shortness of breath with exertion, dry cough, upper abdominal pain, diaphoresis, nausea, and one episode of vomiting this morning.  Does have a history of GERD but says this current chest pain feels different than her reflux symptoms. She also was found to have fluttering in her chest two days ago and it felt similar to when her " heart goes out of rhythm." This happens a few times a week. The episodes last for a few days. She notes getting a shock last week (cardioversion). She is unable to lie flat in the evening, she normally sleeps on 2-3 pillows.  She has added additional pillow three days ago. She has also noticed increased swelling in her lower extremities. She reports that she has been adherent to her medications.   Last week found to be 50 lbs overweight, and her doctors at that time increased her fluid pills. She saw an initial increased in her urinary frequency after her lasix was increased, but over the past two days she feels as though the frequency has decreased. Patient denies improvement in her lower extremities since she has her cardiologist last week.   Denies blood or dark coffee grounds in vomit. Denies diarrhea, dysuria, sore throat, or fevers.   ED Course: Troponins in the ED WNL, CXR no acute cardiopulmonary disease, CBC WNL, however on CMP was noted to have an AKI  with a creatinine of 3.12 and GFR of about 16 from a baseline GFR of in the mid 20s. Cardiology was consulted who did not suspect CP was cardiac in nature and did not think patient was volume overloaded. Recommended gentle hydration for AKI as not much else to offer from a cardiac standpoint.   Meds:  Current Meds  Medication Sig   albuterol (VENTOLIN HFA) 108 (90 Base) MCG/ACT inhaler Inhale 2 puffs into the lungs every 4 (four) hours as needed (Asthma).   ALPRAZolam (XANAX) 0.5 MG tablet Take 1 tablet (0.5 mg total) by mouth 2 (two) times daily as  needed for anxiety.   amiodarone (PACERONE) 200 MG tablet Take 1 tablet (200 mg total) by mouth 2 (two) times daily. (Patient taking differently: Take 400 mg by mouth 2 (two) times daily.)   apixaban (ELIQUIS) 5 MG TABS tablet Take 1 tablet (5 mg total) by mouth 2 (two) times daily.   Aspirin Effervescent (ALKA-SELTZER PO) Take 2 tablets by mouth daily as needed (indigestion).   colchicine 0.6 MG tablet Taking 1/2 tablet by mouth daily   docusate sodium (COLACE) 250 MG capsule Take 100 mg by mouth daily.   Dulaglutide (TRULICITY) 1.5 JY/7.8GN SOPN Inject 1.5 mg into the skin once a week.   DULoxetine (CYMBALTA) 60  MG capsule Take 1 capsule (60 mg total) by mouth daily.   EPINEPHrine 0.3 mg/0.3 mL IJ SOAJ injection Inject 0.3 mg into the muscle as needed for anaphylaxis.   insulin aspart (NOVOLOG) 100 UNIT/ML injection Inject 12 Units into the skin 3 (three) times daily before meals. For blood sugars 0-199 give 0 units of insulin, 201-250 give 4 units, 251-300 give 6 units, 301-350 give 8 units, 351-400 give 10 units,> 400 give 12 units and call M.D. (Patient taking differently: Inject 5-10 Units into the skin 3 (three) times daily before meals. For blood sugars 0-199 give 0 units of insulin, 201-250 give 4 units, 251-300 give 6 units, 301-350 give 8 units, 351-400 give 10 units,> 400 give 12 units and call M.D.)   insulin detemir (LEVEMIR) 100 UNIT/ML injection INJECT 0.4 MLS (40 UNITS TOTAL) INTO THE SKIN 2 (TWO) TIMES DAILY. (Patient taking differently: Inject 50 Units into the skin 2 (two) times daily.)   metolazone (ZAROXOLYN) 5 MG tablet Take 1 tablet (5 mg total) by mouth as directed.   metoprolol succinate (TOPROL XL) 25 MG 24 hr tablet Take 1.5 tablets (37.5 mg total) by mouth daily.   Multiple Vitamins-Minerals (MULTIVITAMIN WOMEN PO) Take 1 tablet by mouth daily.   nitroGLYCERIN (NITROSTAT) 0.4 MG SL tablet Place 1 tablet (0.4 mg total) under the tongue every 5 (five) minutes x 3 doses as needed for chest pain.   ondansetron (ZOFRAN ODT) 4 MG disintegrating tablet Take 1 tablet (4 mg total) by mouth every 8 (eight) hours as needed for nausea or vomiting.   pantoprazole (PROTONIX) 40 MG tablet Take 40 mg by mouth 2 (two) times daily.   potassium chloride SA (KLOR-CON) 20 MEQ tablet Take 2 tablets (40 mEq total) by mouth daily. Take an additional 40 meq with every dose of METOLAZONE   RESTASIS 0.05 % ophthalmic emulsion Place 1 drop into both eyes 2 (two) times daily.   rosuvastatin (CRESTOR) 10 MG tablet Take 1 tablet (10 mg total) by mouth daily.   torsemide (DEMADEX) 20 MG tablet TAKE 4 TABLETS BY  MOUTH DAILY.    Past Medical History:  Diagnosis Date   Allergic rhinitis    Arthritis    Asthma    Chest pain 12/27/2017   Chronic diastolic CHF (congestive heart failure) (HCC)    COPD (chronic obstructive pulmonary disease) (HCC)    Depression    DM (diabetes mellitus) ([place])    DVT (deep vein thrombosis) in pregnancy    Dysrhythmia    atrial fibrilation   GERD (gastroesophageal reflux disease)    Headache(784.0)    HTN (hypertension)    Hyperlipidemia    Obesity    Pneumonia 04/2018   RIGHT LOBE   Shortness of breath    Sleep apnea    compliant with CPAP  Past Surgical History:  Procedure Laterality Date   ABDOMINAL HYSTERECTOMY     BUBBLE STUDY  11/26/2020   Procedure: BUBBLE STUDY;  Surgeon: Buford Dresser, MD;  Location: [place];  Service: Cardiovascular;;   CARDIOVERSION N/A 05/06/2020   Procedure: CARDIOVERSION;  Surgeon: Buford Dresser, MD;  Location: [place];  Service: Cardiovascular;  Laterality: N/A;   CARDIOVERSION N/A 11/04/2020   Procedure: CARDIOVERSION;  Surgeon: Lelon Perla, MD;  Location: [facility] ENDOSCOPY;  Service: Cardiovascular;  Laterality: N/A;   CARDIOVERSION N/A 02/01/2021   Procedure: CARDIOVERSION;  Surgeon: Jolaine Artist, MD;  Location: [facility] ENDOSCOPY;  Service: Cardiovascular;  Laterality: N/A;   CARDIOVERSION N/A 02/09/2021   Procedure: CARDIOVERSION;  Surgeon: Jolaine Artist, MD;  Location: [facility] ENDOSCOPY;  Service: Cardiovascular;  Laterality: N/A;   CARDIOVERSION N/A 04/19/2021   Procedure: CARDIOVERSION;  Surgeon: Fay Records, MD;  Location: [place];  Service: Cardiovascular;  Laterality: N/A;   CATARACT EXTRACTION, BILATERAL     CHOLECYSTECTOMY     COLONOSCOPY WITH PROPOFOL N/A 03/05/2015   Procedure: COLONOSCOPY WITH PROPOFOL;  Surgeon: Carol Ada, MD;  Location: [facility] ENDOSCOPY;  Service: Endoscopy;  Laterality: N/A;   DIAGNOSTIC LAPAROSCOPY     ingrown hallux Left    KNEE SURGERY     LEFT HEART  CATH AND CORONARY ANGIOGRAPHY N/A 12/31/2017   Procedure: LEFT HEART CATH AND CORONARY ANGIOGRAPHY;  Surgeon: Charolette Forward, MD;  Location: [place] CV LAB;  Service: Cardiovascular;  Laterality: N/A;   MAZE N/A 12/29/2020   Procedure: MAZE;  Surgeon: [name], MD;  Location: [place];  Service: Open Heart Surgery;  Laterality: N/A;   MITRAL VALVE REPAIR N/A 12/29/2020   Procedure: MITRAL VALVE REPAIR USING CARBOMEDICS ANNULOFLEX RING SIZE 30;  Surgeon: [name], MD;  Location: [place];  Service: Open Heart Surgery;  Laterality: N/A;   RIGHT HEART CATH N/A 12/22/2020   Procedure: RIGHT HEART CATH;  Surgeon: Larey Dresser, MD;  Location: [place] CV LAB;  Service: Cardiovascular;  Laterality: N/A;   RIGHT HEART CATH N/A 01/31/2021   Procedure: RIGHT HEART CATH;  Surgeon: Jolaine Artist, MD;  Location: [place] CV LAB;  Service: Cardiovascular;  Laterality: N/A;   TEE WITHOUT CARDIOVERSION N/A 11/26/2020   Procedure: TRANSESOPHAGEAL ECHOCARDIOGRAM (TEE);  Surgeon: Buford Dresser, MD;  Location: [facility] ENDOSCOPY;  Service: Cardiovascular;  Laterality: N/A;   TEE WITHOUT CARDIOVERSION N/A 12/29/2020   Procedure: TRANSESOPHAGEAL ECHOCARDIOGRAM (TEE);  Surgeon: [name], MD;  Location: [place];  Service: Open Heart Surgery;  Laterality: N/A;   TRICUSPID VALVE REPLACEMENT N/A 12/29/2020   Procedure: 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 RING SIZE 34;  Surgeon: [name], MD;  Location: [place];  Service: Open Heart Surgery;  Laterality: N/A;   Social:  Lives With: daughter and grandchildren Support: Family Level of Function: Able to perform ADL's, sometimes she does need assistance putting on her shoes. Does use a cane and walker at times PCP: Juluis Mire NP Substances: Smoking never, Denies alcohol use, Denies other substances  Family History:  Sister - CAD w/ MI Sister - CHF, afib Brother - CHF, afib Father - Vocal chord  cancer Mother - Breast cancer mets to bone  Allergies: Allergies as of 05/02/2021 - Review Complete 05/02/2021  Allergen Reaction Noted   Sulfa antibiotics Itching 09/21/2011   Review of Systems: A complete ROS was negative except as per HPI.   OBJECTIVE:   Physical Exam: Blood pressure 117/89,  pulse 93, temperature 98.3 F (36.8 C), temperature source Oral, resp. rate (!) 29, height 5\' 7"  (1.702 m), weight 120.2 kg, SpO2 95 %.  Constitutional: obese, elderly woman, resting in comfortably in bed, in no acute distress HENT: normocephalic atraumatic, mucous membranes moist Eyes: conjunctiva non-erythematous Neck: supple Cardiovascular: irregularly irregular no m/r/g, no JVD, no pitting edema Pulmonary/Chest: normal work of breathing on room air, lungs clear to auscultation bilaterally Abdominal: soft, non-tender, distended MSK: normal bulk and tone, reproducible chest pain Neurological: alert & oriented x 3, answering questions appropriately Skin: warm and dry Psych: normal affect  Labs: CBC    Component Value Date/Time   WBC 6.6 05/02/2021 1438   RBC 4.74 05/02/2021 1438   HGB 13.1 05/02/2021 1438   HGB 10.5 (L) 04/14/2021 0957   HCT 41.3 05/02/2021 1438   HCT 32.5 (L) 04/14/2021 0957   PLT 277 05/02/2021 1438   PLT 216 04/14/2021 0957   MCV 87.1 05/02/2021 1438   MCV 85 04/14/2021 0957   MCH 27.6 05/02/2021 1438   MCHC 31.7 05/02/2021 1438   RDW 15.3 05/02/2021 1438   RDW 15.3 04/14/2021 0957   LYMPHSABS 1.8 02/25/2021 0909   LYMPHSABS 2.6 10/07/2019 1337   MONOABS 1.0 02/25/2021 0909   EOSABS 0.1 02/25/2021 0909   EOSABS 0.2 10/07/2019 1337   BASOSABS 0.0 02/25/2021 0909   BASOSABS 0.0 10/07/2019 1337     CMP     Component Value Date/Time   NA 140 05/02/2021 1438   NA 144 04/14/2021 0957   K 3.5 05/02/2021 1438   CL 92 (L) 05/02/2021 1438   CO2 33 (H) 05/02/2021 1438   GLUCOSE 103 (H) 05/02/2021 1438   BUN 44 (H) 05/02/2021 1438   BUN 19 04/14/2021  0957   CREATININE 3.12 (H) 05/02/2021 1438   CALCIUM 10.0 05/02/2021 1438   PROT 8.9 (H) 05/02/2021 1438   PROT 7.4 03/17/2021 0927   ALBUMIN 3.6 05/02/2021 1438   ALBUMIN 4.0 03/17/2021 0927   AST 17 05/02/2021 1438   ALT 12 05/02/2021 1438   ALKPHOS 95 05/02/2021 1438   BILITOT 0.7 05/02/2021 1438   BILITOT <0.2 03/17/2021 0927   GFRNONAA 16 (L) 05/02/2021 1438   GFRAA 33 (L) 09/08/2020 1054    Imaging: DG Chest 2 View  Result Date: 05/02/2021 CLINICAL DATA:  Chest pain EXAM: CHEST - 2 VIEW COMPARISON:  Chest x-ray 03/29/2021. FINDINGS: Cardiac silhouette is enlarged, unchanged. Postsurgical changes are similar to the prior study. The lungs are clear. There is no pleural effusion or pneumothorax. The mediastinal silhouette is within normal limits. There is no pulmonary vascular congestion. No acute fractures are seen. There are surgical clips in the upper abdomen. IMPRESSION: 1. No acute cardiopulmonary process. 2. Stable cardiomegaly. Electronically Signed   By: Ronney Asters M.D.   On: 05/02/2021 15:31    RHC (5/22): elevated R>L filling pressures with PAPi low, suggestive of significant component of RV dysfunction, prominent v-waves in PCWP and RA tracings suggestive of MR/TR, CO low - RHC (6/22) with low filling pressures with moderately reduced CO. - Echo (7/22) admission in setting of AFL with RVR, showed EF 30-35%, moderate LVH, moderate RV dysfunction, PASP 48, s/p MV repair with mild MR and mean gradient 10 mmHg with HR around 130, s/p TV repair with mild-moderate TR, dilated IVC.  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   EKG: personally reviewed my interpretation is atrial flutter, right axis deviation, normal interval. Prior EKG was sinus rhythm with right axis  deviation unchanged from prior   ASSESSMENT & PLAN:    Assessment & Plan by Problem: Active Problems:   AKI (acute kidney injury) ([place])   Antonya Leeder is a 65 y.o. with 65 y/o F with a PMHx of obesity,  chronic diastolic and systolic HF with mildly reduced LVEF (45-50%), severe MR/TR, atrial fibrillation on eliquis, HTN, DMII, HLD, asthma, gout, and OSA who is coming in for chest pain and pressure and was admitted for an AKI on CKD IV.  #AKI on CKD IV Patient's new established baseline GFR in the 20s, with progression from prior CKD3b to CKD4. GFR is acutely decreased today to 16. Suspect prerenal etiology caused by over-diuresis and is volume down in the setting of her increased lasix. No pitting edema on exam, lungs are CTAB. Will continue follow Cr/GFR and provide gentle hydration. Patient does not see a kidney doctor as an outpatient but may need outpatient follow up.  - IVF 50 mL/hr for 5 hours - UA unremarkable - f/u urine Na - f/u renal ultrasound - holding diuretics - daily BMP - hold nephrotoxic meds  #Chest Pain #Chronic systolic  HF Troponins WNL, no EKG changes, do not suspect ACS at this time. Suspect chest pain is non-cardiac and actually MSK in nature, patient endorses a dry cough which could be exacerbating pain. Last LHC cath in 2019 - Prox LAD lesion is 10% stenosed, Dist LM to Ost LAD lesion is 10% stenosed, Ost Cx lesion is 15% stenosed. [place] (5/22): elevated R>L filling pressures with PAPi low, suggestive of significant component of RV dysfunction, prominent v-waves in PCWP and RA tracings suggestive of MR/TR, CO low. Last EF was 30-35% on 7/22. - Cardiology following, appreciate assistance - Holding torsemide 2/2 AKI, per cards restart 9/14 - Continue Toprol 37.5 mg daily - 40 K daily - NYHA IIIb - GDMT has been limited by renal function - daily weights - K>4 Mg >2  #Persistent AFib / Flutter (most likely atypical) S/p several cardioversions, most recent 04/19/21. Likely not a candidate for ablation or PPM. -continue amio 200mg  twice daily -continue apixaban -continue toprol 37.5mg    #Valvular heart disease: S/p MV and TV repairs in 5/22.   Echo 02/25/21 with  mild-moderate TR.  There is only mild MR, but mean gradient across the valve is 10 mmHg suggesting mitral stenosis  #Asthma -continue home albuterol  #DMII c/b polyneuropathy - SSI - f/u A1c - continue home duloxetine 60 mg daily -continue home trulicity  #Depression -continue home duloxetine as above  #HTN - continue metoprolol as above  #HLD -rosuvastatin 10 mg daily  #Gout - holding colchicine, patient does not appear to be in acute flare  #OSA - does not wear CPAP at home  #GERD -pantroprazole 40 mg daily    Diet: Heart Healthy VTE:  Eliquis IVF: LR,50cc/hr Code: Full  Prior to Admission Living Arrangement: Home, living with family Anticipated Discharge Location: Home Barriers to Discharge: Medical management of AKI  Dispo: Admit patient to Observation with expected length of stay less than 2 midnights.  Signed: Scarlett Presto, MD Internal Medicine Resident PGY-2 Pager: [phone]  05/02/2021, 8:36 PM

## 2021-05-02 NOTE — Telephone Encounter (Signed)
[name] is  a obese Female, 65 y.o. present to her PCP at 1:12 appointment 1:30 saw PCP going into office - (she is still in her car- passenger) Patient states [name] I do not feel well. Gave 4 81mg  of ASA asked to chew and a couple of sips of water. She was having  Chest pain, Pain radiating from the left side of her neck down to her arm,  shortness of breath, headaches, and diaphoretic. I stated you needed to go to the ED not to your  PCP appointment . PCP called 911 vs EKG- ok with not ST elevation but showed Afib. VS 127/79, pulse initially 64 but increased to 100. O2 on room air 94-100. Notified Dr. Purnell Shoemaker of this event will follow at hospital. Juluis Mire, NP

## 2021-05-02 NOTE — Progress Notes (Signed)
ANTICOAGULATION CONSULT NOTE - Initial Consult  Pharmacy Consult for Eliquis Indication: atrial fibrillation  Allergies  Allergen Reactions   Sulfa Antibiotics Itching    Patient Measurements: Height: 5\' 7"  (170.2 cm) Weight: 120.2 kg (265 lb) IBW/kg (Calculated) : 61.6  Vital Signs: Temp: 98.3 F (36.8 C) (09/12 1423) Temp Source: Oral (09/12 1423) BP: 113/83 (09/12 2100) Pulse Rate: 98 (09/12 2000)  Labs: Recent Labs    05/02/21 1438 05/02/21 1638  HGB 13.1  --   HCT 41.3  --   PLT 277  --   CREATININE 3.12*  --   TROPONINIHS 6 6    Estimated Creatinine Clearance: 24.4 mL/min (A) (by C-G formula based on SCr of 3.12 mg/dL (H)).   Medical History: Past Medical History:  Diagnosis Date   Allergic rhinitis    Arthritis    Asthma    Chest pain 12/27/2017   Chronic diastolic CHF (congestive heart failure) (HCC)    COPD (chronic obstructive pulmonary disease) (HCC)    Depression    DM (diabetes mellitus) ([place])    DVT (deep vein thrombosis) in pregnancy    Dysrhythmia    atrial fibrilation   GERD (gastroesophageal reflux disease)    Headache(784.0)    HTN (hypertension)    Hyperlipidemia    Obesity    Pneumonia 04/2018   RIGHT LOBE   Shortness of breath    Sleep apnea    compliant with CPAP    Medications:  (Not in a hospital admission)   Assessment: 89 YOF who presents with chest pain on apixaban PTA for AFib. Pharmacy consulted to resume apixaban while admitted.   H/H and Plt wnl. SCr 3.12   Goal of Therapy:  Stroke prevention Monitor platelets by anticoagulation protocol: Yes   Plan:  -Resume home dose of apixaban 5 mg twice daily to start tonight -Monitor CBC and for s/s of bleeding   Albertina Parr, PharmD., BCPS, BCCCP Clinical Pharmacist Please refer to [facility] for unit-specific pharmacist

## 2021-05-02 NOTE — ED Provider Notes (Signed)
Emergency Medicine Provider Triage Evaluation Note  [name] , a 65 y.o. female  was evaluated in triage.  Pt complains of chest pain.  The pain started yesterday at unknown time.  She says it feels like an elephant is sitting on her chest.  Has been constant.  Is localized to the left chest and radiates down her right arm.  Pain is pleuritic. this is happened in the past when she was diagnosed with A. fib.  She is having associated palpitations.  She says that she is having some difficulty breathing.  She is having bilateral swollen legs.  Also complaining of epigastric abdominal pain.  No nausea no vomiting.  No change in bowel movements.  No melena noted.  No headache.  No vision changes.  She had a recent CABG over the summer.  He has a history of heart failure.  Review of Systems  Positive: Chest pain, shortness of breath, palpitations, leg swelling, abdominal pain Negative: Nausea, vomiting, constipation, melena, diarrhea, headache, vision change  Physical Exam  BP 112/74 (BP Location: Right Arm)   Pulse 92   Temp 98.3 F (36.8 C) (Oral)   Resp 16   Ht 5\' 7"  (1.702 m)   Wt 120.2 kg   SpO2 96%   BMI 41.50 kg/m  Gen:   Awake, no distress  Resp:  Normal effort.  Clear to auscultation bilaterally MSK:   Moves extremities without difficulty Other:  Heart sounds normal.  No extremity weakness.  Bilateral lower legs edema that is equal, nontender.  Chest pain is reproducible with palpating anterior chest wall.  Medical Decision Making  Medically screening exam initiated at 2:38 PM.  Appropriate orders placed.  [name] was informed that the remainder of the evaluation will be completed by another provider, this initial triage assessment does not replace that evaluation, and the importance of remaining in the ED until their evaluation is complete.   Leslie Birchwood, PA-C 05/02/21 [name], [place], DO 05/02/21 [name]

## 2021-05-03 ENCOUNTER — Observation Stay (HOSPITAL_COMMUNITY): Payer: Medicare Other

## 2021-05-03 ENCOUNTER — Encounter (HOSPITAL_COMMUNITY): Payer: Medicare Other

## 2021-05-03 DIAGNOSIS — K219 Gastro-esophageal reflux disease without esophagitis: Secondary | ICD-10-CM | POA: Diagnosis present

## 2021-05-03 DIAGNOSIS — R0602 Shortness of breath: Secondary | ICD-10-CM | POA: Diagnosis not present

## 2021-05-03 DIAGNOSIS — E861 Hypovolemia: Secondary | ICD-10-CM | POA: Diagnosis present

## 2021-05-03 DIAGNOSIS — Z20822 Contact with and (suspected) exposure to covid-19: Secondary | ICD-10-CM | POA: Diagnosis present

## 2021-05-03 DIAGNOSIS — I5082 Biventricular heart failure: Secondary | ICD-10-CM | POA: Diagnosis present

## 2021-05-03 DIAGNOSIS — G8929 Other chronic pain: Secondary | ICD-10-CM | POA: Diagnosis present

## 2021-05-03 DIAGNOSIS — F32A Depression, unspecified: Secondary | ICD-10-CM | POA: Diagnosis present

## 2021-05-03 DIAGNOSIS — Z6841 Body Mass Index (BMI) 40.0 and over, adult: Secondary | ICD-10-CM | POA: Diagnosis not present

## 2021-05-03 DIAGNOSIS — E785 Hyperlipidemia, unspecified: Secondary | ICD-10-CM | POA: Diagnosis present

## 2021-05-03 DIAGNOSIS — R0902 Hypoxemia: Secondary | ICD-10-CM | POA: Diagnosis present

## 2021-05-03 DIAGNOSIS — G4733 Obstructive sleep apnea (adult) (pediatric): Secondary | ICD-10-CM | POA: Diagnosis present

## 2021-05-03 DIAGNOSIS — I4892 Unspecified atrial flutter: Secondary | ICD-10-CM | POA: Diagnosis present

## 2021-05-03 DIAGNOSIS — E1122 Type 2 diabetes mellitus with diabetic chronic kidney disease: Secondary | ICD-10-CM | POA: Diagnosis present

## 2021-05-03 DIAGNOSIS — I2510 Atherosclerotic heart disease of native coronary artery without angina pectoris: Secondary | ICD-10-CM | POA: Diagnosis present

## 2021-05-03 DIAGNOSIS — I483 Typical atrial flutter: Secondary | ICD-10-CM | POA: Diagnosis not present

## 2021-05-03 DIAGNOSIS — I130 Hypertensive heart and chronic kidney disease with heart failure and stage 1 through stage 4 chronic kidney disease, or unspecified chronic kidney disease: Secondary | ICD-10-CM | POA: Diagnosis present

## 2021-05-03 DIAGNOSIS — I5042 Chronic combined systolic (congestive) and diastolic (congestive) heart failure: Secondary | ICD-10-CM | POA: Diagnosis present

## 2021-05-03 DIAGNOSIS — J309 Allergic rhinitis, unspecified: Secondary | ICD-10-CM | POA: Diagnosis present

## 2021-05-03 DIAGNOSIS — I4819 Other persistent atrial fibrillation: Secondary | ICD-10-CM | POA: Diagnosis present

## 2021-05-03 DIAGNOSIS — Z23 Encounter for immunization: Secondary | ICD-10-CM | POA: Diagnosis present

## 2021-05-03 DIAGNOSIS — J449 Chronic obstructive pulmonary disease, unspecified: Secondary | ICD-10-CM | POA: Diagnosis present

## 2021-05-03 DIAGNOSIS — E1142 Type 2 diabetes mellitus with diabetic polyneuropathy: Secondary | ICD-10-CM | POA: Diagnosis present

## 2021-05-03 DIAGNOSIS — F419 Anxiety disorder, unspecified: Secondary | ICD-10-CM | POA: Diagnosis present

## 2021-05-03 DIAGNOSIS — N1832 Chronic kidney disease, stage 3b: Secondary | ICD-10-CM | POA: Diagnosis not present

## 2021-05-03 DIAGNOSIS — N179 Acute kidney failure, unspecified: Principal | ICD-10-CM

## 2021-05-03 DIAGNOSIS — I5032 Chronic diastolic (congestive) heart failure: Secondary | ICD-10-CM | POA: Insufficient documentation

## 2021-05-03 DIAGNOSIS — N184 Chronic kidney disease, stage 4 (severe): Secondary | ICD-10-CM | POA: Diagnosis present

## 2021-05-03 DIAGNOSIS — M109 Gout, unspecified: Secondary | ICD-10-CM | POA: Diagnosis present

## 2021-05-03 DIAGNOSIS — R0789 Other chest pain: Secondary | ICD-10-CM | POA: Diagnosis not present

## 2021-05-03 DIAGNOSIS — I5020 Unspecified systolic (congestive) heart failure: Secondary | ICD-10-CM | POA: Insufficient documentation

## 2021-05-03 LAB — CBC WITH DIFFERENTIAL/PLATELET
Abs Immature Granulocytes: 0.02 10*3/uL (ref 0.00–0.07)
Basophils Absolute: 0 10*3/uL (ref 0.0–0.1)
Basophils Relative: 1 %
Eosinophils Absolute: 0 10*3/uL (ref 0.0–0.5)
Eosinophils Relative: 1 %
HCT: 36.7 % (ref 36.0–46.0)
Hemoglobin: 11.8 g/dL — ABNORMAL LOW (ref 12.0–15.0)
Immature Granulocytes: 0 %
Lymphocytes Relative: 28 %
Lymphs Abs: 1.8 10*3/uL (ref 0.7–4.0)
MCH: 28 pg (ref 26.0–34.0)
MCHC: 32.2 g/dL (ref 30.0–36.0)
MCV: 87 fL (ref 80.0–100.0)
Monocytes Absolute: 1.2 10*3/uL — ABNORMAL HIGH (ref 0.1–1.0)
Monocytes Relative: 18 %
Neutro Abs: 3.3 10*3/uL (ref 1.7–7.7)
Neutrophils Relative %: 52 %
Platelets: 226 10*3/uL (ref 150–400)
RBC: 4.22 MIL/uL (ref 3.87–5.11)
RDW: 15.4 % (ref 11.5–15.5)
WBC: 6.4 10*3/uL (ref 4.0–10.5)
nRBC: 0 % (ref 0.0–0.2)

## 2021-05-03 LAB — SODIUM, URINE, RANDOM: Sodium, Ur: 14 mmol/L

## 2021-05-03 LAB — BASIC METABOLIC PANEL
Anion gap: 13 (ref 5–15)
Anion gap: 10 (ref 5–15)
BUN: 51 mg/dL — ABNORMAL HIGH (ref 8–23)
BUN: 51 mg/dL — ABNORMAL HIGH (ref 8–23)
CO2: 31 mmol/L (ref 22–32)
CO2: 34 mmol/L — ABNORMAL HIGH (ref 22–32)
Calcium: 9.7 mg/dL (ref 8.9–10.3)
Calcium: 9.9 mg/dL (ref 8.9–10.3)
Chloride: 93 mmol/L — ABNORMAL LOW (ref 98–111)
Chloride: 92 mmol/L — ABNORMAL LOW (ref 98–111)
Creatinine, Ser: 3.27 mg/dL — ABNORMAL HIGH (ref 0.44–1.00)
Creatinine, Ser: 3.45 mg/dL — ABNORMAL HIGH (ref 0.44–1.00)
GFR, Estimated: 15 mL/min — ABNORMAL LOW (ref >60)
GFR, Estimated: 14 mL/min — ABNORMAL LOW (ref >60)
Glucose, Bld: 107 mg/dL — ABNORMAL HIGH (ref 70–99)
Glucose, Bld: 136 mg/dL — ABNORMAL HIGH (ref 70–99)
Potassium: 3.4 mmol/L — ABNORMAL LOW (ref 3.5–5.1)
Potassium: 3.6 mmol/L (ref 3.5–5.1)
Sodium: 137 mmol/L (ref 135–145)
Sodium: 136 mmol/L (ref 135–145)

## 2021-05-03 LAB — MAGNESIUM: Magnesium: 2 mg/dL (ref 1.7–2.4)

## 2021-05-03 LAB — SARS CORONAVIRUS 2 (TAT 6-24 HRS): SARS Coronavirus 2: NEGATIVE

## 2021-05-03 LAB — PROTEIN / CREATININE RATIO, URINE
Creatinine, Urine: 177.25 mg/dL
Protein Creatinine Ratio: 0.14 mg/mg{Cre} (ref 0.00–0.15)
Total Protein, Urine: 24 mg/dL

## 2021-05-03 LAB — LIPID PANEL
Cholesterol: 158 mg/dL (ref 0–200)
HDL: 50 mg/dL (ref >40)
LDL Cholesterol: 91 mg/dL (ref 0–99)
Total CHOL/HDL Ratio: 3.2 RATIO
Triglycerides: 83 mg/dL (ref <150)
VLDL: 17 mg/dL (ref 0–40)

## 2021-05-03 LAB — CBG MONITORING, ED
Glucose-Capillary: 138 mg/dL — ABNORMAL HIGH (ref 70–99)
Glucose-Capillary: 129 mg/dL — ABNORMAL HIGH (ref 70–99)

## 2021-05-03 LAB — HEMOGLOBIN A1C
Hgb A1c MFr Bld: 5.8 % — ABNORMAL HIGH (ref 4.8–5.6)
Mean Plasma Glucose: 119.76 mg/dL

## 2021-05-03 LAB — GLUCOSE, CAPILLARY: Glucose-Capillary: 140 mg/dL — ABNORMAL HIGH (ref 70–99)

## 2021-05-03 LAB — BRAIN NATRIURETIC PEPTIDE: B Natriuretic Peptide: 32.9 pg/mL (ref 0.0–100.0)

## 2021-05-03 MED ORDER — ONDANSETRON HCL 4 MG/2ML IJ SOLN
4.0000 mg | Freq: Four times a day (QID) | INTRAMUSCULAR | Status: DC | PRN
  Administered 2021-05-03 (×3): 4 mg via INTRAVENOUS
  Filled 2021-05-03 (×3): qty 2

## 2021-05-03 MED ORDER — INFLUENZA VAC SPLIT QUAD 0.5 ML IM SUSY
0.5000 mL | PREFILLED_SYRINGE | INTRAMUSCULAR | Status: AC
  Administered 2021-05-04: 0.5 mL via INTRAMUSCULAR
  Filled 2021-05-03: qty 0.5

## 2021-05-03 MED ORDER — LIDOCAINE 5 % EX PTCH
1.0000 | MEDICATED_PATCH | CUTANEOUS | Status: DC
  Administered 2021-05-04 – 2021-05-05 (×2): 1 via TRANSDERMAL
  Filled 2021-05-03 (×2): qty 1

## 2021-05-03 MED ORDER — ONDANSETRON HCL 4 MG/2ML IJ SOLN
4.0000 mg | Freq: Once | INTRAMUSCULAR | Status: AC
  Administered 2021-05-03: 4 mg via INTRAVENOUS
  Filled 2021-05-03: qty 2

## 2021-05-03 MED ORDER — ALUM & MAG HYDROXIDE-SIMETH 200-200-20 MG/5ML PO SUSP
30.0000 mL | Freq: Once | ORAL | Status: AC
  Administered 2021-05-03: 30 mL via ORAL
  Filled 2021-05-03: qty 30

## 2021-05-03 MED ORDER — INSULIN ASPART 100 UNIT/ML IJ SOLN
0.0000 [IU] | Freq: Three times a day (TID) | INTRAMUSCULAR | Status: DC
  Administered 2021-05-04: 4 [IU] via SUBCUTANEOUS
  Administered 2021-05-04 – 2021-05-05 (×3): 3 [IU] via SUBCUTANEOUS

## 2021-05-03 MED ORDER — LACTATED RINGERS IV SOLN: INTRAVENOUS | Status: DC

## 2021-05-03 MED ORDER — HYOSCYAMINE SULFATE 0.125 MG SL SUBL
0.2500 mg | SUBLINGUAL_TABLET | Freq: Once | SUBLINGUAL | Status: AC
  Administered 2021-05-03: 0.25 mg via SUBLINGUAL
  Filled 2021-05-03: qty 2

## 2021-05-03 MED ORDER — DICLOFENAC SODIUM 1 % EX GEL
2.0000 g | Freq: Four times a day (QID) | CUTANEOUS | Status: DC
  Administered 2021-05-03 – 2021-05-06 (×10): 2 g via TOPICAL
  Filled 2021-05-03: qty 100

## 2021-05-03 MED ORDER — INSULIN ASPART 100 UNIT/ML IJ SOLN: 0.0000 [IU] | Freq: Three times a day (TID) | INTRAMUSCULAR | Status: DC

## 2021-05-03 MED ORDER — LIDOCAINE VISCOUS HCL 2 % MT SOLN
15.0000 mL | Freq: Once | OROMUCOSAL | Status: AC
  Administered 2021-05-03: 15 mL via ORAL
  Filled 2021-05-03: qty 15

## 2021-05-03 MED ORDER — ALPRAZOLAM 0.25 MG PO TABS
0.2500 mg | ORAL_TABLET | Freq: Two times a day (BID) | ORAL | Status: DC | PRN
  Administered 2021-05-05: 0.25 mg via ORAL
  Filled 2021-05-03: qty 1

## 2021-05-03 NOTE — Progress Notes (Addendum)
Advanced Heart Failure Rounding Note  PCP-Cardiologist: Buford Dresser, MD   Subjective:    05/02/21 Presented to ED with chest pain. HS Trop 6. Creatinine was 3.1. Diuretics held. In A flutter with controlled rate on admit.   SARS 2 negative. HS Trop 6>6. Creatinine 3.1>3.3   Earlier this morning developed nausea/vomiting.     Objective:   Weight Range: 120.2 kg Body mass index is 41.5 kg/m.   Vital Signs:   Temp:  [98.3 F (36.8 C)-98.7 F (37.1 C)] 98.7 F (37.1 C) (09/12 2200) Pulse Rate:  [77-109] 102 (09/13 0930) Resp:  [12-29] 20 (09/13 0930) BP: (92-122)/(59-93) 117/68 (09/13 0930) SpO2:  [93 %-100 %] 94 % (09/13 0930) Weight:  [120.2 kg] 120.2 kg (09/12 1418)    Weight change: Filed Weights   05/02/21 1418  Weight: 120.2 kg    Intake/Output:  No intake or output data in the 24 hours ending 05/03/21 1010    Physical Exam    General:  Vomiting.  HEENT: Normal Neck: Supple. JVP difficult to assess with vomiting. . Carotids 2+ bilat; no bruits. No lymphadenopathy or thyromegaly appreciated. Cor: PMI nondisplaced. Regular rate & rhythm. No rubs, gallops or murmurs. Lungs: Clear Abdomen: obese, soft, nontender, nondistended. No hepatosplenomegaly. No bruits or masses. Good bowel sounds. Extremities: No cyanosis, clubbing, rash, edema Neuro: Alert & orientedx3, cranial nerves grossly intact. moves all 4 extremities w/o difficulty. Affect flat    Telemetry   A flutter   EKG   N/A  Labs    CBC Recent Labs    05/02/21 1438 05/03/21 0730  WBC 6.6 6.4  NEUTROABS  --  3.3  HGB 13.1 11.8*  HCT 41.3 36.7  MCV 87.1 87.0  PLT 277 563   Basic Metabolic Panel Recent Labs    05/02/21 1438 05/03/21 0603  NA 140 137  K 3.5 3.4*  CL 92* 93*  CO2 33* 31  GLUCOSE 103* 107*  BUN 44* 51*  CREATININE 3.12* 3.27*  CALCIUM 10.0 9.7  MG  --  2.0   Liver Function Tests Recent Labs    05/02/21 1438  AST 17  ALT 12  ALKPHOS 95   BILITOT 0.7  PROT 8.9*  ALBUMIN 3.6   No results for input(s): LIPASE, AMYLASE in the last 72 hours. Cardiac Enzymes No results for input(s): CKTOTAL, CKMB, CKMBINDEX, TROPONINI in the last 72 hours.  BNP: BNP (last 3 results) Recent Labs    02/23/21 1011 02/25/21 0909 04/28/21 1059  BNP 204.3* 162.7* 88.7    ProBNP (last 3 results) No results for input(s): PROBNP in the last 8760 hours.   D-Dimer No results for input(s): DDIMER in the last 72 hours. Hemoglobin A1C Recent Labs    05/03/21 0730  HGBA1C 5.8*   Fasting Lipid Panel Recent Labs    05/03/21 0603  CHOL 158  HDL 50  LDLCALC 91  TRIG 83  CHOLHDL 3.2   Thyroid Function Tests No results for input(s): TSH, T4TOTAL, T3FREE, THYROIDAB in the last 72 hours.  Invalid input(s): FREET3  Other results:   Imaging    DG Chest 2 View  Result Date: 05/02/2021 CLINICAL DATA:  Chest pain EXAM: CHEST - 2 VIEW COMPARISON:  Chest x-ray 03/29/2021. FINDINGS: Cardiac silhouette is enlarged, unchanged. Postsurgical changes are similar to the prior study. The lungs are clear. There is no pleural effusion or pneumothorax. The mediastinal silhouette is within normal limits. There is no pulmonary vascular congestion. No acute fractures are seen.  There are surgical clips in the upper abdomen. IMPRESSION: 1. No acute cardiopulmonary process. 2. Stable cardiomegaly. Electronically Signed   By: Ronney Asters M.D.   On: 05/02/2021 15:31   US RENAL  Result Date: 05/02/2021 CLINICAL DATA:  Acute renal insufficiency EXAM: RENAL / URINARY TRACT ULTRASOUND COMPLETE COMPARISON:  01/02/2021 FINDINGS: Right Kidney: Renal measurements: 8.9 x 5.4 x 4.8 cm = volume: 120.1 mL. Echogenicity within normal limits. No mass or hydronephrosis. 8 mm simple right renal cortical cyst. Left Kidney: Renal measurements: 9.9 x 5.1 x 4.8 cm = volume: 128.7 mL. Echogenicity within normal limits. No mass or hydronephrosis visualized. Bladder: Appears normal  for degree of bladder distention. Other: None. IMPRESSION: 1. Grossly unremarkable bilateral renal ultrasound. Electronically Signed   By: Randa Ngo M.D.   On: 05/02/2021 23:34     Medications:     Scheduled Medications:  amiodarone  200 mg Oral BID   apixaban  5 mg Oral BID   docusate sodium  100 mg Oral Daily   DULoxetine  60 mg Oral Daily   insulin aspart  0-20 Units Subcutaneous Q4H   metoprolol succinate  37.5 mg Oral Daily   pantoprazole  40 mg Oral BID   potassium chloride SA  40 mEq Oral Daily   rosuvastatin  10 mg Oral Daily    Infusions:   PRN Medications: acetaminophen **OR** acetaminophen, albuterol, ondansetron (ZOFRAN) IV    Patient Profile    65 y/o woman as above with MV/TV repair in 8/46, systolic HF EF 96-29%, PAF s/p recent DC-CV, morbid obesity and CKD IV.   Presented with chest pain and AKI.  Assessment/Plan  Chest Pain Previously had cath in 2019 - Prox LAD lesion is 10% stenosed, Dist LM to Ost LAD lesion is 10% stenosed, Ost Cx lesion is 15% stenosed, -HS Trop 6>Hs trop - EKG with no ischemic changes. Does not appear to be cardiac in nature.  - No chest pain today.    2. AKI on CKD Stage IIIb.  Baseline creatinine 2.1-2.4  Creatinine trending up 2.1>2.7>3.1>3.3  -Ongoing vomiting. May need additional IV fluids.  -Hold diuretics Check BMET in am.   3.PAF Has had multiple cardioversion with most recent cardioversion 04/19/21. She was back in NSR but today she is in atrial flutter with controlled rate. In the community followed by EP She has failed Tikosyn and amiodarone.She had Maze in 5/22 and DCCV in 6/22 & 04/19/21, last of which she had ERAF. - Per EP, likely not candidate for PPM or AV nodal ablation -Continue eliquis 5 mg twice a day + Toprol 37.5 mg twice a day + amio 200 mg twice a day.  - Rate controlled. Check EKG .     4. Chronic Systolic Heart Failure RHC (5/22): elevated R>L filling pressures with PAPi low, suggestive of  significant component of RV dysfunction, prominent v-waves in PCWP and RA tracings suggestive of MR/TR, CO low - RHC (6/22) with low filling pressures with moderately reduced CO. - Echo (7/22) admission in setting of AFL with RVR, showed EF 30-35%, moderate LVH, moderate RV dysfunction, PASP 48, s/p MV repair with mild MR and mean gradient 10 mmHg with HR around 130, s/p TV repair with mild-moderate TR, dilated IVC.  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  On 9/8 she was given IV lasix + metolazone then had metolazone 5 mg x3 days with torsemide 80 mg daily.  Now with AKI. She  does not appear volume overloaded. Would hold torsemide and  restart 05/04/21.  GDMT limited by CKD.    5. Valvular Heart Disease S/p MV and TV repair in 5/22.  - Echo 02/25/21 with mild-moderate TR.  There is only mild MR, but mean gradient across the valve is 10 mmHg suggesting mitral stenosis   6. Obesity Body mass index is 41.5 kg/m.   7. DMII 01/2021 Last Hgb A1C 7.4   8. Nausea/Vomiting.  ? Viral . I called her sister and she also has nausea/vomiting. I wonder if this is viral illness. Check viral panel.      Length of Stay: 0  Darrick Grinder, NP  05/03/2021, 10:10 AM  Advanced Heart Failure Team Pager [phone] (M-F; 7a - 5p)  Please contact [facility] for night-coverage after hours (5p -7a ) and weekends on [email or web address]  Patient seen and examined with the above-signed Advanced Practice Provider and/or Housestaff. I personally reviewed laboratory data, imaging studies and relevant notes. I independently examined the patient and formulated the important aspects of the plan. I have edited the note to reflect any of my changes or salient points. I have personally discussed the plan with the patient and/or family.  She had severe n/v earlier in the day and received IVF. Feeling better now. No further n/v. SCr slightly more elevated overnight. Renal u/s and UA unremarkable,.  BP ok. Remains in rate-controlled AFL.    General:  Lying flat in bed No resp difficulty HEENT: normal Neck: supple. no JVD. Carotids 2+ bilat; no bruits. No lymphadenopathy or thryomegaly appreciated. Cor: PMI nondisplaced. Irregular rate & rhythm. No rubs, gallops or murmurs. Lungs: clear Abdomen: obese soft, nontender, nondistended. No hepatosplenomegaly. No bruits or masses. Good bowel sounds. Extremities: no cyanosis, clubbing, rash, edema Neuro: alert & orientedx3, cranial nerves grossly intact. moves all 4 extremities w/o difficulty. Affect pleasant  I suspect volume status still on the low side. Agree with holding IVF for now and encourage po intake as she tolerates. Would continue to hold diuretics. I suspect renal function will begin to improve tomorrow. If not, can consider RHC to further evaluate hemodynamics. Suspect she has left atrial flutter and has not responded well to attempts at rhythm control. Continue rate control for now.   Glori Bickers, MD  10:31 PM

## 2021-05-03 NOTE — Progress Notes (Addendum)
Patient with worsening of creatinine on afternoon BMP.  Upon evaluation today patient does have crackles bilateral lung bases and has minimal lower extremity edema.  JVD difficult to assess secondary to body habitus.  She does note feeling more short of breath since this morning.  She is saturating well on room air, breathing comfortably in no respiratory distress.  Uncertain if worsening creatinine levels are secondary to initial AKI and diuretic use or if since getting IV fluid she is now having congestion leading to worsening of her creatinine.  Her IV fluids have expired and we will hold off on more fluids at this time.  Reassess BMP tomorrow morning as well as volume status.  If thought to be congestion can consider restarting diuretic therapy tomorrow.  She is also endorsing some nausea, she has Zofran as needed ordered.  If continuous doses of Zofran given, can consider EKG to ensure no QT C prolongation.

## 2021-05-03 NOTE — ED Notes (Signed)
Phlebotomy at bedside to obtain morning lab work

## 2021-05-03 NOTE — ED Notes (Signed)
Complains of nausea. MD informed. No active vomiting. No signs of distress.

## 2021-05-03 NOTE — ED Notes (Signed)
Received pt, ao x 4. Pt states she's feeling much better than yesterday. Denies pain at the moment

## 2021-05-03 NOTE — Progress Notes (Signed)
2000- Patient arrived into room 5W02 via bed from ED. A&O x4. VSS. Non-skid footwear in place. Bed locked and in lowest position, call light and personal belongings within reach.

## 2021-05-03 NOTE — Progress Notes (Addendum)
Subjective: No acute events or concerns since overnight admission.  Patient had nausea this morning and was given Zofran. Patient seen on rounds and she continues to endorse substernal chest pain that is constant and feels like pressure. She also endorses shortness of breath at rest. Patient was reassured that cardiac workup has been negative for acute cardiac causes of CP.   Patient was given GI cocktail with improvement in nausea and slight improvement in chest pain later in the morning.    Objective:  Vital signs in last 24 hours: Vitals:   05/03/21 0605 05/03/21 0800 05/03/21 0830 05/03/21 0930  BP: 111/69 116/77 (!) 95/59 117/68  Pulse: 99   (!) 102  Resp: 19 14 (!) 22 20  Temp:      TempSrc:      SpO2: 97%   94%  Weight:      Height:       No intake or output data in the 24 hours ending 05/03/21 1330  Physical Exam: General: Well appearing obese African-American female, NAD HENT: normocephalic, atraumatic EYES: conjunctiva non-erythematous, no scleral icterus CV: regular rate, normal rhythm, no murmurs, rubs, gallops. 1+ pitting edema bilateral lower extremities Pulmonary: sating well on RA, crackles at bilateral lung bases, without wheezes, rhonchi Abdominal: non-distended, soft, epigastric tenderness to palpation, normal BS Skin: Warm and dry, no rashes or lesions Neurological: MS: awake, alert and oriented x3, normal speech and fund of knowledge Motor: moves all extremities antigravity Psych: normal affect   Assessment/Plan:  Active Problems:   AKI (acute kidney injury) ([place])  Savannaha Stonerock is a 65 y.o. with 65 y/o F with a PMHx of obesity, chronic diastolic and systolic HF with mildly reduced LVEF (45-50%), severe MR/TR, atrial fibrillation on eliquis, HTN, DMII, HLD, asthma, gout, and OSA who presented for chest pain and pressure and was admitted for an AKI on CKD IV.  #AKI on CKD IV #Hypokalemia Patient's new established baseline GFR in the 20s,  suspect progression of CKD3b to CKD4. BUN and CR elevated from baseline (2.1-2.4) on admission. UA unremarkable. Suspect prerenal etiology caused by over-diuresis and hypovolemic in the setting of recent increase in diuretic therapy. Patient with increase in torsemide to 80mg  BID and metolazone 5mg  3x daily 9/06. On admission by overnight team, she was noted to be hypovolemic, LCTAB, without LEE, chest x-ray without vascular congestion.  She was started on gentle fluids.  9/13 creatinine from 3.1 to 3.3.  Additional gentle fluids ordered this morning.  Patient had crackles on exam with pitting edema in the BLE.  We will continue to need to monitor volume status carefully. She is likely preload dependent with her RV function and very sensitive to changes in her volume status.  -70ml/hr LR gentle hydration -Repeat BMP 1700 -Holding torsemide and metolazone  -F/u urine protein creatinine ratio -F/u urine sodium -Electrolyte repletion as needed - hold nephrotoxic meds  #Chest pain likely noncardiac #GERD Troponins WNL, no ST elevations on EKG .  ACS unlikely.  Suspect chest pain is noncardiac. Previously had cath in 2019 without significant stenosis.  Patient has history of GERD and tenderness to the epigastric palpation this morning.  Patient was given GI cocktail with mild improvement of chest pain.  Suspect pain is secondary to GERD. -Protonix 40 mg twice daily  #Nausea and vomiting Patient with nausea, vomiting 9/13 likely secondary to AKI.  1 episode of nonbloody emesis. Patient afebrile without leukocytosis, still tolerating p.o. intake.  -Zofran as needed -Scheduled Colace daily -Improvement  in nausea following GI cocktail this a.m.  #Chronic Systolic Heart Failure  #Chronic Biventricular heart failure  NYHAIIIb Echo (7/22) admission in setting of AFL with RVR, showed EF 30-35%, moderate LVH, moderate RV dysfunction, PASP 48, s/p MV repair with mild MR and mean gradient 10 mmHg with HR  around 130, s/p TV repair with mild-moderate TR, dilated IVC. On 9/8 she was given IV lasix + metolazone then had metolazone 5 mg x3 days with torsemide 80 mg daily. Cardiology was consulted and recommended holding torsemide given AKI to restart 9/14.  Today on exam crackles at bilateral lung bases, trace LEE.  Will need some monitor volume status carefully while receiving gentle hydration. -Cardiology following, appreciate recs -f/u BNP -f/u repeat CXR -restart torsemide 80 mg daily on 9/14 - daily weights, strict I's and O's -Guideline directed medical therapy limited by CKD - goal K>4 Mg >2, 75meq K scheduled daily   #Paroxysmal A. Fib/a.flutter #HLD #HTN Has had multiple cardioversion with most recent cardioversion 04/19/21.  Was previously in NSR, EKG shows a flutter on admission.  Patient is followed by EP.  She has failed Tikosyn and amiodarone.  S/p maze 5/22 and D CCV in 6/22 and 04/19/2021, after which patient had relapse of A. Fib. 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  Echo 02/25/2021 with mild mod TR, and mild MR with possible mitral stenosis.    #T2DM #Peripheral neuropathy Patient has history of type 2 diabetes mellitus.  Home medications: Trulicity 1.5 mg once per week, mealtime NovoLog 12 units, Levemir 40 units twice daily.  -SSI resistant -Duloxetine 60mg  daily  #Depression #Anxiety -continue home duloxetine as above  #Gout - holding colchicine, patient does not appear to be in acute flare  #OSA - does not wear CPAP at home  Diet: Heart healthy carb mod VTE: Eliquis IVF: 50 mL/h LR Code: Full   Prior to Admission Living Arrangement: Home Anticipated Discharge Location: Home Barriers to Discharge: Pending clinical  improvement Dispo: Anticipated discharge in approximately 1-2 day(s).   Wayland Denis, MD 05/03/2021, 11:14 AM Pager: [phone] After 5pm on weekdays and 1pm on weekends: On Call pager [phone]

## 2021-05-04 ENCOUNTER — Encounter (HOSPITAL_COMMUNITY): Payer: Self-pay | Admitting: Internal Medicine

## 2021-05-04 ENCOUNTER — Telehealth (HOSPITAL_COMMUNITY): Payer: Self-pay

## 2021-05-04 ENCOUNTER — Ambulatory Visit (HOSPITAL_BASED_OUTPATIENT_CLINIC_OR_DEPARTMENT_OTHER): Payer: Medicare Other | Admitting: Cardiology

## 2021-05-04 ENCOUNTER — Ambulatory Visit (INDEPENDENT_AMBULATORY_CARE_PROVIDER_SITE_OTHER): Payer: Medicare Other

## 2021-05-04 ENCOUNTER — Ambulatory Visit (HOSPITAL_COMMUNITY): Payer: Medicare Other

## 2021-05-04 DIAGNOSIS — R0602 Shortness of breath: Secondary | ICD-10-CM | POA: Diagnosis not present

## 2021-05-04 DIAGNOSIS — I483 Typical atrial flutter: Secondary | ICD-10-CM

## 2021-05-04 DIAGNOSIS — N1832 Chronic kidney disease, stage 3b: Secondary | ICD-10-CM | POA: Diagnosis not present

## 2021-05-04 DIAGNOSIS — R0789 Other chest pain: Secondary | ICD-10-CM | POA: Diagnosis not present

## 2021-05-04 DIAGNOSIS — N179 Acute kidney failure, unspecified: Secondary | ICD-10-CM | POA: Diagnosis not present

## 2021-05-04 LAB — BASIC METABOLIC PANEL
Anion gap: 10 (ref 5–15)
Anion gap: 11 (ref 5–15)
BUN: 51 mg/dL — ABNORMAL HIGH (ref 8–23)
BUN: 53 mg/dL — ABNORMAL HIGH (ref 8–23)
CO2: 34 mmol/L — ABNORMAL HIGH (ref 22–32)
CO2: 28 mmol/L (ref 22–32)
Calcium: 9.5 mg/dL (ref 8.9–10.3)
Calcium: 9.4 mg/dL (ref 8.9–10.3)
Chloride: 93 mmol/L — ABNORMAL LOW (ref 98–111)
Chloride: 98 mmol/L (ref 98–111)
Creatinine, Ser: 3.33 mg/dL — ABNORMAL HIGH (ref 0.44–1.00)
Creatinine, Ser: 3.44 mg/dL — ABNORMAL HIGH (ref 0.44–1.00)
GFR, Estimated: 15 mL/min — ABNORMAL LOW (ref >60)
GFR, Estimated: 14 mL/min — ABNORMAL LOW (ref >60)
Glucose, Bld: 103 mg/dL — ABNORMAL HIGH (ref 70–99)
Glucose, Bld: 144 mg/dL — ABNORMAL HIGH (ref 70–99)
Potassium: 3.3 mmol/L — ABNORMAL LOW (ref 3.5–5.1)
Potassium: 4.1 mmol/L (ref 3.5–5.1)
Sodium: 137 mmol/L (ref 135–145)
Sodium: 137 mmol/L (ref 135–145)

## 2021-05-04 LAB — CBC
HCT: 35.8 % — ABNORMAL LOW (ref 36.0–46.0)
Hemoglobin: 11.7 g/dL — ABNORMAL LOW (ref 12.0–15.0)
MCH: 28 pg (ref 26.0–34.0)
MCHC: 32.7 g/dL (ref 30.0–36.0)
MCV: 85.6 fL (ref 80.0–100.0)
Platelets: 216 10*3/uL (ref 150–400)
RBC: 4.18 MIL/uL (ref 3.87–5.11)
RDW: 15.4 % (ref 11.5–15.5)
WBC: 6.7 10*3/uL (ref 4.0–10.5)
nRBC: 0 % (ref 0.0–0.2)

## 2021-05-04 LAB — GLUCOSE, CAPILLARY
Glucose-Capillary: 118 mg/dL — ABNORMAL HIGH (ref 70–99)
Glucose-Capillary: 161 mg/dL — ABNORMAL HIGH (ref 70–99)
Glucose-Capillary: 147 mg/dL — ABNORMAL HIGH (ref 70–99)
Glucose-Capillary: 129 mg/dL — ABNORMAL HIGH (ref 70–99)

## 2021-05-04 LAB — MAGNESIUM: Magnesium: 2.3 mg/dL (ref 1.7–2.4)

## 2021-05-04 MED ORDER — CALCIUM CARBONATE ANTACID 500 MG PO CHEW: 1.0000 | CHEWABLE_TABLET | Freq: Every day | ORAL | Status: DC | PRN

## 2021-05-04 MED ORDER — SIMETHICONE 80 MG PO CHEW
80.0000 mg | CHEWABLE_TABLET | ORAL | Status: AC
  Administered 2021-05-04: 80 mg via ORAL
  Filled 2021-05-04: qty 1

## 2021-05-04 MED ORDER — DIPHENHYDRAMINE HCL 25 MG PO CAPS
25.0000 mg | ORAL_CAPSULE | Freq: Once | ORAL | Status: AC
  Administered 2021-05-04: 25 mg via ORAL
  Filled 2021-05-04: qty 1

## 2021-05-04 MED ORDER — CALCIUM CARBONATE ANTACID 500 MG PO CHEW
3.0000 | CHEWABLE_TABLET | Freq: Every day | ORAL | Status: DC | PRN
  Administered 2021-05-04: 600 mg via ORAL
  Filled 2021-05-04: qty 3

## 2021-05-04 MED ORDER — POTASSIUM CHLORIDE CRYS ER 20 MEQ PO TBCR
20.0000 meq | EXTENDED_RELEASE_TABLET | Freq: Once | ORAL | Status: AC
  Administered 2021-05-04: 20 meq via ORAL
  Filled 2021-05-04: qty 1

## 2021-05-04 MED ORDER — CALCIUM CARBONATE ANTACID 500 MG PO CHEW
1.0000 | CHEWABLE_TABLET | Freq: Once | ORAL | Status: AC
  Administered 2021-05-04: 200 mg via ORAL
  Filled 2021-05-04: qty 1

## 2021-05-04 MED ORDER — LACTATED RINGERS IV SOLN: INTRAVENOUS | Status: AC

## 2021-05-04 NOTE — TOC Initial Note (Addendum)
Transition of Care [facility]) - Initial/Assessment Note    Patient Details  Name: [name] MRN: [id number] Date of Birth: 07/24/1956  Transition of Care [facility]) CM/SW Contact:    [place], [place] Phone Number: 05/04/2021, 4:40 PM  Clinical Narrative:                 CSW spoke with the patient and completed a very brief SDOH with the patient who denied having any needs at this time. Patient reported she does have a PCP, [name] and she can get to the pharmacy to pick up her medications. [name] denied having any concerns or needs at this time. CSW provided the patient with the social workers name and position and if anything changes to please reach out so that CSW can provide support.  CSW will continue to follow throughout discharge.    Barriers to Discharge: Continued Medical Work up   Patient Goals and CMS Choice        Expected Discharge Plan and Services   In-house Referral: Clinical Social Work     Living arrangements for the past 2 months: Apartment                                      Prior Living Arrangements/Services Living arrangements for the past 2 months: Apartment   Patient language and need for interpreter reviewed:: Yes        Need for Family Participation in Patient Care: No (Comment) Care giver support system in place?: No (comment)   Criminal Activity/Legal Involvement Pertinent to Current Situation/Hospitalization: No - Comment as needed  Activities of Daily Living Home Assistive Devices/Equipment: Cane (specify quad or straight), Walker (specify type) (straight cane, wheels) ADL Screening (condition at time of admission) Patient's cognitive ability adequate to safely complete daily activities?: No Is the patient deaf or have difficulty hearing?: No Does the patient have difficulty seeing, even when wearing glasses/contacts?: No Does the patient have difficulty concentrating, remembering, or making decisions?:  No Patient able to express need for assistance with ADLs?: No Does the patient have difficulty dressing or bathing?: No Independently performs ADLs?: Yes (appropriate for developmental age) Does the patient have difficulty walking or climbing stairs?: No Weakness of Legs: Both Weakness of Arms/Hands: None  Permission Sought/Granted   Permission granted to share information with : Yes, Verbal Permission Granted              Emotional Assessment Appearance:: Appears stated age Attitude/Demeanor/Rapport: Engaged Affect (typically observed): Pleasant Orientation: : Oriented to Self, Oriented to Place, Oriented to  Time, Oriented to Situation   Psych Involvement: No (comment)  Admission diagnosis:  Hypoxemia [R09.02] AKI (acute kidney injury) ([place]) [N17.9] Acute kidney injury ([place]) [N17.9] Atrial flutter, unspecified type ([place]) [I48.92] Patient Active Problem List   Diagnosis Date Noted   Biventricular heart failure ([place])    Acute kidney injury ([place]) 05/02/2021   Acute on chronic systolic heart failure ([place]) 02/25/2021   Atrial flutter ([place])    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   Acute exacerbation of CHF (congestive heart failure) ([place]) 01/29/2021   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   Encounter for preoperative dental examination    Teeth missing    Gingivitis    Accretions on teeth    CHF exacerbation ([place]) 12/21/2020   Acute on chronic heart failure ([place]) 12/19/2020   Atrial fibrillation, permanent ([place]) 11/02/2020   Chronic  combined systolic and diastolic heart failure ([employer]) 05/06/2020   NICM (nonischemic cardiomyopathy) ([place]) 04/01/2020   Family history of heart disease 04/01/2020   Mitral regurgitation 04/01/2020   Severe tricuspid regurgitation 04/01/2020   Closed right ankle fracture 09/24/2019   DKA, type 2 ([place]) 09/24/2019   HHNC (hyperglycemic hyperosmolar nonketotic coma) ([place]) 02/21/2019   Hypokalemia 02/21/2019   Acute kidney injury superimposed on  CKD ([place]) 02/21/2019   Acute on chronic left systolic heart failure ([place]) 07/01/2018   Congestive heart failure with left ventricular diastolic dysfunction, acute ([place]) 07/01/2018   Right upper lobe pneumonia 05/14/2018   Atrial fibrillation with RVR ([place]) 05/14/2018   Chronic atrial fibrillation ([place]) 96/29/5284   Diastolic dysfunction 13/24/4010   Diabetes mellitus type 2 in obese ([place]) 12/26/2017   Nausea and vomiting    Chest pain 02/26/2012   OSA (obstructive sleep apnea) 09/21/2011   Hypoxia 09/21/2011   Diabetes mellitus ([place]) 02/04/2010   Morbid obesity ([place]) 02/04/2010   AF (paroxysmal atrial fibrillation) ([place]) 02/04/2010   Gastroparesis 02/04/2010   Acute gastroenteritis 02/04/2010   PCP:  [name], NP Pharmacy:   [id number] [place], [address] [place] [address] Phone: [phone] Fax: [phone]  [facility] [address] [place] [address] Phone: [phone] Fax: [phone]     Social Determinants of Health (SDOH) Interventions Food Insecurity Interventions: Intervention Not Indicated Financial Strain Interventions: Intervention Not Indicated Housing Interventions: Intervention Not Indicated Transportation Interventions: Intervention Not Indicated  Readmission Risk Interventions Readmission Risk Prevention Plan 02/10/2021 01/11/2021 03/08/2020  Transportation Screening Complete Complete Complete  Medication Review Press photographer) Complete - Complete  PCP or Specialist appointment within 3-5 days of discharge Complete Complete Complete  HRI or [place] Complete Complete Complete  SW Recovery Care/Counseling Consult Complete Complete Complete  Palliative Care Screening Not Applicable Not Applicable Not Opp Not Applicable Not Applicable Not Applicable  Some recent data might be hidden    [name], MSW,  LCSWA [phone] Heart Failure Social Worker

## 2021-05-04 NOTE — Progress Notes (Signed)
Subjective: No acute events overnight.  Received Benadryl for itching with improvement in symptoms.  She received Tums for abdominal pain with improvement in symptoms. No I's and O's documented per chart review.  Patient was seen and examined on rounds this morning.  Patient endorses improvement in chest pain from prior.  Has tenderness to palpation left anterior chest wall.  She continues to endorse nausea, was reminded she has as needed's available if needed.  She reports good urine output.  She still endorses some shortness of breath though has not required oxygen supplementation.  Objective:  Vital signs in last 24 hours: Vitals:   05/03/21 2019 05/04/21 0419 05/04/21 0739 05/04/21 1158  BP: 133/76 94/64 134/68 119/85  Pulse: (!) 105 93 79 76  Resp: 18 18 20 19   Temp: 98.6 F (37 C) 98.7 F (37.1 C) 98.3 F (36.8 C) 98.4 F (36.9 C)  TempSrc: Oral Oral Oral Oral  SpO2:  97% 99% 98%  Weight: 121.8 kg     Height: 5\' 7"  (1.702 m)       Intake/Output Summary (Last 24 hours) at 05/04/2021 1310 Last data filed at 05/04/2021 1000 Gross per 24 hour  Intake 360 ml  Output --  Net 360 ml    Physical Exam: General: Well appearing obese African-American female, NAD HENT: normocephalic, atraumatic EYES: conjunctiva non-erythematous, no scleral icterus CV: regular rate, normal rhythm, no murmurs, rubs, gallops. 1+ pitting edema bilateral lower extremities, tenderness to left anterior chest wall Pulmonary: sating well on RA, crackles at bilateral lung bases, without wheezes, rhonchi Abdominal: non-distended, soft, epigastric tenderness to palpation, normal BS Skin: Warm and dry, no rashes or lesions Neurological: MS: awake, alert and oriented x3, normal speech and fund of knowledge Motor: moves all extremities antigravity Psych: normal affect   Assessment/Plan:  Active Problems:   Atrial flutter ([place])   Acute kidney injury ([place])  [name] is a 65 y.o. with 65 y/o  F with a PMHx of obesity, chronic diastolic and systolic HF with mildly reduced LVEF (45-50%), severe MR/TR, atrial fibrillation on eliquis, HTN, DMII, HLD, asthma, gout, and OSA who presented for chest pain and pressure and was admitted for an AKI on CKD IV.  #AKI on CKD IV likely 2/2 over diuresis #Hypokalemia Suspect progression of CKD3b to CKD4. Cr 3.1 to 3.2 to 3.4 to 3.3, starting to improve. Suspect prerenal etiology caused by over-diuresis in the setting of recent increase in diuretic therapy.  Patient s/p 750 cc gentle hydration.  Volume status stable. We will continue to need to monitor volume status carefully. She is likely preload dependent with her RV function and very sensitive to changes in her volume status.  -Repeat BMP 1600, can consider additional gentle hydration this afternoon after repeat BMP check if creatinine no longer improving -Holding torsemide and metolazone  -Electrolyte repletion as needed, 7meq K scheduled daily  - hold nephrotoxic meds  #Chest pain likely noncardiac #GERD Troponins WNL, no ST elevations on EKG .  ACS unlikely.  Suspect chest pain is noncardiac. Previously had cath in 2019 without significant stenosis.  Patient has history of GERD and tenderness to the epigastric palpation this morning.  Patient was given GI cocktail with mild improvement of chest pain.  Suspect pain is secondary to GERD. -Protonix 40 mg twice daily -TUMs PRN -considering outpatient referral to GI for EGD for evaluation of gastritis/PUD  #Nausea and vomiting Patient with nausea, vomiting 9/13 likely secondary to AKI.  1 episode of nonbloody emesis.  Patient afebrile without leukocytosis, still tolerating p.o. intake.  -Zofran as needed -Scheduled Colace daily -Improvement in nausea following GI cocktail, declines additional doses  #Chronic Systolic Heart Failure  #Chronic Biventricular heart failure, NYHAIIIb Will need some monitor volume status carefully while receiving  gentle hydration. She is likely preload dependent with her RV function and very sensitive to changes in her volume status. Guideline directed medical therapy limited by CKD -Cardiology following, appreciate recs -Cardiology recommends holding torsemide additional day with plan to restart torsemide dose 40 mg daily for Guideline directed medical therapy - daily weights, strict I's and O's - goal K>4 Mg >2, 44meq K scheduled daily   #Paroxysmal A. Fib/a.flutter #HLD #HTN Has had multiple cardioversions with most recent cardioversion 04/19/21.  Was previously in NSR, EKG showed a flutter on admission.  Patient is in a flutter though rate is controlled.  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  Echo 02/25/2021 with mild mod TR, and mild MR with possible mitral stenosis.    #T2DM #Peripheral neuropathy Patient has history of type 2 diabetes mellitus.  Home medications: Trulicity 1.5 mg once per week, mealtime NovoLog 12 units, Levemir 40 units twice daily.  -SSI resistant -Duloxetine 60mg  daily  #Depression #Anxiety -continue home duloxetine as above  #Gout - holding colchicine, patient does not appear to be in acute flare  #OSA - does not wear CPAP at home  Diet: Heart healthy carb mod VTE: Eliquis IVF: none Code: Full   Prior to Admission Living Arrangement: Home Anticipated Discharge Location: Home Barriers to Discharge: Pending clinical improvement Dispo: Anticipated discharge in approximately 1-2 day(s).   Wayland Denis, MD 05/04/2021, 1:10 PM Pager: [phone] After 5pm on weekdays and 1pm on weekends: On Call pager [phone]

## 2021-05-04 NOTE — Progress Notes (Signed)
Received a page from nurse about patient experiencing some itching. With patient's recent nausea went to bedside to assess for anaphylaxis. On exam patient was sitting on the side of the bed in no acute distress. Able to converse without difficulty and was not experiencing any shortness of breath. Denied any numbness, tingling, or itching of her mouth or throat. Patient had a normal bowel movement during the night that was not diarrhea. Nausea improved with zofran earlier today but came back. Says that she often takes tums at home which helps. Lungs were CTAB with no wheezing appreciated. Bibasilar crackles unchanged from prior examination. Normal work of breathing. Patient had normal work of breathing. No rashes, edema, or erythema were noted. Low suspicion for anaphylaxis at this time. Will order one time dose of benadryl and tums.

## 2021-05-04 NOTE — Progress Notes (Signed)
Paged by RN that patient was having RLQ abdominal pain. Evaluated at bedside, patient resting comfortably in no acute distress. She endorses sharp RLQ abdominal pain that onset this evening that is constant. She denies radiation of the pain. She denies ever having this pain before. Nothing makes it better, including a dose of tylenol and lidocaine patch placed on lower abdomen. She does not know of anything that makes it worse. She has had regular bowel movements without difficulty and denies any pain or discomfort with urination.   On exam she is no acute distress. Abdomen is soft, non tender, non distended, obese. Tenderness is localized to the left lower quadrant with superficial and deep palpation. Low suspicion for acute abdomen at this time. Difficult to assess whether pain is superficial or deep. No fever or chills and having regular bowel movements, less suspicious for constipation, diverticulitis, infectious etiology. Difficult to assess for hernia 2/2 body habitus.Will order gasx and see if that helps with the pain.

## 2021-05-04 NOTE — Progress Notes (Addendum)
Advanced Heart Failure Rounding Note  PCP-Cardiologist: Buford Dresser, MD   Subjective:    05/02/21 Presented to ED with chest pain. HS Trop 6. Creatinine was 3.1. Diuretics held. In A flutter with controlled rate on admit.  9/13 - Given IV fluids.   Creatinine 3.1>3.3 >3.3   No further nausea. Complaining of abdominal discomfort. (Non tender on exam)  Objective:   Weight Range: 121.8 kg Body mass index is 42.06 kg/m.   Vital Signs:   Temp:  [98.3 F (36.8 C)-98.7 F (37.1 C)] 98.3 F (36.8 C) (09/14 0739) Pulse Rate:  [69-105] 79 (09/14 0739) Resp:  [16-22] 20 (09/14 0739) BP: (94-134)/(64-82) 134/68 (09/14 0739) SpO2:  [94 %-99 %] 99 % (09/14 0739) Weight:  [121.8 kg] 121.8 kg (09/13 2019) Last BM Date: 05/03/21  Weight change: Filed Weights   05/02/21 1418 05/03/21 2019  Weight: 120.2 kg 121.8 kg    Intake/Output:   Intake/Output Summary (Last 24 hours) at 05/04/2021 1019 Last data filed at 05/03/2021 2006 Gross per 24 hour  Intake 120 ml  Output --  Net 120 ml      Physical Exam   General:   No resp difficulty HEENT: normal Neck: supple. no JVD. Carotids 2+ bilat; no bruits. No lymphadenopathy or thryomegaly appreciated. Cor: PMI nondisplaced. Regular rate & rhythm. No rubs, gallops or murmurs. Lungs: clear Abdomen: obese, soft, nontender, nondistended. No hepatosplenomegaly. No bruits or masses. Good bowel sounds. Extremities: no cyanosis, clubbing, rash, edema Neuro: alert & orientedx3, cranial nerves grossly intact. moves all 4 extremities w/o difficulty. Affect pleasant  Telemetry   NSR with occasional PVCs.   EKG   N/A  Labs    CBC Recent Labs    05/03/21 0730 05/04/21 0015  WBC 6.4 6.7  NEUTROABS 3.3  --   HGB 11.8* 11.7*  HCT 36.7 35.8*  MCV 87.0 85.6  PLT 226 016   Basic Metabolic Panel Recent Labs    05/03/21 0603 05/03/21 2008 05/04/21 0015  NA 137 136 137  K 3.4* 3.6 3.3*  CL 93* 92* 93*  CO2 31 34* 34*   GLUCOSE 107* 136* 103*  BUN 51* 51* 51*  CREATININE 3.27* 3.45* 3.33*  CALCIUM 9.7 9.9 9.5  MG 2.0  --  2.3   Liver Function Tests Recent Labs    05/02/21 1438  AST 17  ALT 12  ALKPHOS 95  BILITOT 0.7  PROT 8.9*  ALBUMIN 3.6   No results for input(s): LIPASE, AMYLASE in the last 72 hours. Cardiac Enzymes No results for input(s): CKTOTAL, CKMB, CKMBINDEX, TROPONINI in the last 72 hours.  BNP: BNP (last 3 results) Recent Labs    02/25/21 0909 04/28/21 1059 05/03/21 0730  BNP 162.7* 88.7 32.9    ProBNP (last 3 results) No results for input(s): PROBNP in the last 8760 hours.   D-Dimer No results for input(s): DDIMER in the last 72 hours. Hemoglobin A1C Recent Labs    05/03/21 0730  HGBA1C 5.8*   Fasting Lipid Panel Recent Labs    05/03/21 0603  CHOL 158  HDL 50  LDLCALC 91  TRIG 83  CHOLHDL 3.2   Thyroid Function Tests No results for input(s): TSH, T4TOTAL, T3FREE, THYROIDAB in the last 72 hours.  Invalid input(s): FREET3  Other results:   Imaging    DG CHEST PORT 1 VIEW  Result Date: 05/03/2021 CLINICAL DATA:  Worsening hypoxia EXAM: PORTABLE CHEST 1 VIEW COMPARISON:  05/02/2021 FINDINGS: Prior median sternotomy. Cardiomegaly. Lungs clear.  No effusions or acute bony abnormality. IMPRESSION: Cardiomegaly.  No active disease. Electronically Signed   By: Rolm Baptise M.D.   On: 05/03/2021 11:41     Medications:     Scheduled Medications:  amiodarone  200 mg Oral BID   apixaban  5 mg Oral BID   diclofenac Sodium  2 g Topical QID   docusate sodium  100 mg Oral Daily   DULoxetine  60 mg Oral Daily   insulin aspart  0-20 Units Subcutaneous TID with meals   lidocaine  1 patch Transdermal Q24H   metoprolol succinate  37.5 mg Oral Daily   pantoprazole  40 mg Oral BID   potassium chloride SA  40 mEq Oral Daily   rosuvastatin  10 mg Oral Daily    Infusions:   PRN Medications: acetaminophen **OR** acetaminophen, albuterol, ALPRAZolam,  calcium carbonate, ondansetron (ZOFRAN) IV    Patient Profile    65 y/o woman as above with MV/TV repair in 7/42, systolic HF EF 59-56%, PAF s/p recent DC-CV, morbid obesity and CKD IV.   Presented with chest pain and AKI.  Assessment/Plan  Chest Pain Previously had cath in 2019 - Prox LAD lesion is 10% stenosed, Dist LM to Ost LAD lesion is 10% stenosed, Ost Cx lesion is 15% stenosed, -HS Trop 6>Hs trop - EKG with no ischemic changes. Does not appear to be cardiac in nature.  - having chest discomfort but suspect GI in nature. Improved Maalox.    2. AKI on CKD Stage IIIb.  Baseline creatinine 2.1-2.4  Creatinine trending up 2.1>2.7>3.1>3.3>3.  -Vomiting resolved and tolerating pos.    3.PAF Has had multiple cardioversion with most recent cardioversion 04/19/21. She was back in NSR but today she is in atrial flutter with controlled rate. In the community followed by EP She has failed Tikosyn and amiodarone.She had Maze in 5/22 and DCCV in 6/22 & 04/19/21, last of which she had ERAF. - Per EP, likely not candidate for PPM or AV nodal ablation -Continue eliquis 5 mg twice a day + Toprol 37.5 mg twice a day + amio 200 mg twice a day.  - In SR today.    4. Chronic Systolic Heart Failure RHC (5/22): elevated R>L filling pressures with PAPi low, suggestive of significant component of RV dysfunction, prominent v-waves in PCWP and RA tracings suggestive of MR/TR, CO low - RHC (6/22) with low filling pressures with moderately reduced CO. - Echo (7/22) admission in setting of AFL with RVR, showed EF 30-35%, moderate LVH, moderate RV dysfunction, PASP 48, s/p MV repair with mild MR and mean gradient 10 mmHg with HR around 130, s/p TV repair with mild-moderate TR, dilated IVC.  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  On 9/8 she was given IV lasix + metolazone then had metolazone 5 mg x3 days with torsemide 80 mg daily.  Given IV fluids. Improved today. Supp K.  Now with AKI. She  does not appear  volume overloaded. Would hold torsemide today. Anticipate starting torsemide 40 mg daily  GDMT limited by CKD.    5. Valvular Heart Disease S/p MV and TV repair in 5/22.  - Echo 02/25/21 with mild-moderate TR.  There is only mild MR, but mean gradient across the valve is 10 mmHg suggesting mitral stenosis   6. Obesity Body mass index is 42.06 kg/m.    7. DMII 01/2021 Last Hgb A1C 7.4   8. Nausea/Vomiting.  ? Viral . Her sister and she also has nausea/vomiting. I wonder if  this is viral illness. Check viral panel.  - On PPI.      Length of Stay: 1  Amy Clegg, NP  05/04/2021, 10:19 AM  Advanced Heart Failure Team Pager [phone] (M-F; 7a - 5p)  Please contact [facility] for night-coverage after hours (5p -7a ) and weekends on [email or web address]  No further ab pain. Denies orthopnea or PND. No CP. Still c/o ab pain. Scr seems to be turning the corner.  General:  Sitting in bed. No resp difficulty HEENT: normal Neck: supple. no JVD. Carotids 2+ bilat; no bruits. No lymphadenopathy or thryomegaly appreciated. Cor: PMI nondisplaced. Irregular rate & rhythm. No rubs, gallops or murmurs. Lungs: clear Abdomen: obese soft, nontender, nondistended. No hepatosplenomegaly. No bruits or masses. Good bowel sounds. Extremities: no cyanosis, clubbing, rash, edema Neuro: alert & orientedx3, cranial nerves grossly intact. moves all 4 extremities w/o difficulty. Affect pleasant  Volume status appears stable. Scr beginning to turn the corner. Would continue to hold diuretics for at least one more day. Remains in AFL. Rate is controlled. Mobilize.   Glori Bickers, MD  12:56 PM

## 2021-05-04 NOTE — Telephone Encounter (Signed)
Pt is currently in the hospital. Will place ppw in medical hold bin.

## 2021-05-05 ENCOUNTER — Inpatient Hospital Stay (HOSPITAL_COMMUNITY): Payer: Medicare Other

## 2021-05-05 DIAGNOSIS — I5082 Biventricular heart failure: Secondary | ICD-10-CM | POA: Diagnosis not present

## 2021-05-05 DIAGNOSIS — N179 Acute kidney failure, unspecified: Secondary | ICD-10-CM | POA: Diagnosis not present

## 2021-05-05 DIAGNOSIS — I4892 Unspecified atrial flutter: Secondary | ICD-10-CM | POA: Diagnosis not present

## 2021-05-05 LAB — BASIC METABOLIC PANEL
Anion gap: 9 (ref 5–15)
BUN: 52 mg/dL — ABNORMAL HIGH (ref 8–23)
CO2: 31 mmol/L (ref 22–32)
Calcium: 9.3 mg/dL (ref 8.9–10.3)
Chloride: 97 mmol/L — ABNORMAL LOW (ref 98–111)
Creatinine, Ser: 3.43 mg/dL — ABNORMAL HIGH (ref 0.44–1.00)
GFR, Estimated: 14 mL/min — ABNORMAL LOW (ref >60)
Glucose, Bld: 108 mg/dL — ABNORMAL HIGH (ref 70–99)
Potassium: 3.7 mmol/L (ref 3.5–5.1)
Sodium: 137 mmol/L (ref 135–145)

## 2021-05-05 LAB — MISC LABCORP TEST (SEND OUT): Labcorp test code: 139650

## 2021-05-05 LAB — GLUCOSE, CAPILLARY
Glucose-Capillary: 98 mg/dL (ref 70–99)
Glucose-Capillary: 128 mg/dL — ABNORMAL HIGH (ref 70–99)
Glucose-Capillary: 125 mg/dL — ABNORMAL HIGH (ref 70–99)
Glucose-Capillary: 128 mg/dL — ABNORMAL HIGH (ref 70–99)

## 2021-05-05 MED ORDER — HYDRALAZINE HCL 25 MG PO TABS: 12.5000 mg | ORAL_TABLET | Freq: Three times a day (TID) | ORAL | Status: DC

## 2021-05-05 MED ORDER — TORSEMIDE 20 MG PO TABS: 20.0000 mg | ORAL_TABLET | Freq: Every day | ORAL | Status: DC

## 2021-05-05 MED ORDER — ISOSORBIDE MONONITRATE ER 30 MG PO TB24: 15.0000 mg | ORAL_TABLET | Freq: Every day | ORAL | Status: DC

## 2021-05-05 NOTE — Progress Notes (Addendum)
Advanced Heart Failure Rounding Note  PCP-Cardiologist: Buford Dresser, MD   Subjective:    05/02/21 Presented to ED with chest pain. HS Trop 6. Creatinine was 3.1. Diuretics held. In A flutter with controlled rate on admit.  9/13 - Given IV fluids. 9/14 Given IV fluids.    Creatinine 3.1>3.3 >3.3> 3.4> 3.4    Denies SOB. Had abdominal last night. Nontender on exam. Had BM last night. Tolerating diet.  Objective:   Weight Range: 121.8 kg Body mass index is 42.06 kg/m.   Vital Signs:   Temp:  [97.8 F (36.6 C)-98.6 F (37 C)] 97.8 F (36.6 C) (09/15 0732) Pulse Rate:  [71-76] 76 (09/15 0732) Resp:  [15-20] 15 (09/15 0732) BP: (113-137)/(60-85) 137/82 (09/15 0732) SpO2:  [95 %-100 %] 95 % (09/15 0732) Last BM Date: 05/03/21  Weight change: Filed Weights   05/02/21 1418 05/03/21 2019  Weight: 120.2 kg 121.8 kg    Intake/Output:   Intake/Output Summary (Last 24 hours) at 05/05/2021 1045 Last data filed at 05/05/2021 0733 Gross per 24 hour  Intake 120.79 ml  Output 1050 ml  Net -929.21 ml      Physical Exam  General:  In bed. . No resp difficulty HEENT: normal Neck: supple. no JVD. Carotids 2+ bilat; no bruits. No lymphadenopathy or thryomegaly appreciated. Cor: PMI nondisplaced. Regular rate & rhythm. No rubs, gallops or murmurs. Lungs: clear Abdomen: obese, soft, nontender, nondistended. No hepatosplenomegaly. No bruits or masses. Good bowel sounds. Extremities: no cyanosis, clubbing, rash, edema Neuro: alert & orientedx3, cranial nerves grossly intact. moves all 4 extremities w/o difficulty. Affect pleasant  Telemetry    NSR 70s   EKG   N/A  Labs    CBC Recent Labs    05/03/21 0730 05/04/21 0015  WBC 6.4 6.7  NEUTROABS 3.3  --   HGB 11.8* 11.7*  HCT 36.7 35.8*  MCV 87.0 85.6  PLT 226 035   Basic Metabolic Panel Recent Labs    05/03/21 0603 05/03/21 2008 05/04/21 0015 05/04/21 1627 05/05/21 0119  NA 137   < > 137 137 137  K  3.4*   < > 3.3* 4.1 3.7  CL 93*   < > 93* 98 97*  CO2 31   < > 34* 28 31  GLUCOSE 107*   < > 103* 144* 108*  BUN 51*   < > 51* 53* 52*  CREATININE 3.27*   < > 3.33* 3.44* 3.43*  CALCIUM 9.7   < > 9.5 9.4 9.3  MG 2.0  --  2.3  --   --    < > = values in this interval not displayed.   Liver Function Tests Recent Labs    05/02/21 1438  AST 17  ALT 12  ALKPHOS 95  BILITOT 0.7  PROT 8.9*  ALBUMIN 3.6   No results for input(s): LIPASE, AMYLASE in the last 72 hours. Cardiac Enzymes No results for input(s): CKTOTAL, CKMB, CKMBINDEX, TROPONINI in the last 72 hours.  BNP: BNP (last 3 results) Recent Labs    02/25/21 0909 04/28/21 1059 05/03/21 0730  BNP 162.7* 88.7 32.9    ProBNP (last 3 results) No results for input(s): PROBNP in the last 8760 hours.   D-Dimer No results for input(s): DDIMER in the last 72 hours. Hemoglobin A1C Recent Labs    05/03/21 0730  HGBA1C 5.8*   Fasting Lipid Panel Recent Labs    05/03/21 0603  CHOL 158  HDL 50  LDLCALC 91  TRIG 83  CHOLHDL 3.2   Thyroid Function Tests No results for input(s): TSH, T4TOTAL, T3FREE, THYROIDAB in the last 72 hours.  Invalid input(s): FREET3  Other results:   Imaging    No results found.   Medications:     Scheduled Medications:  amiodarone  200 mg Oral BID   apixaban  5 mg Oral BID   diclofenac Sodium  2 g Topical QID   docusate sodium  100 mg Oral Daily   DULoxetine  60 mg Oral Daily   insulin aspart  0-20 Units Subcutaneous TID with meals   lidocaine  1 patch Transdermal Q24H   metoprolol succinate  37.5 mg Oral Daily   pantoprazole  40 mg Oral BID   potassium chloride SA  40 mEq Oral Daily   rosuvastatin  10 mg Oral Daily    Infusions:   PRN Medications: acetaminophen **OR** acetaminophen, albuterol, ALPRAZolam, calcium carbonate, ondansetron (ZOFRAN) IV    Patient Profile    65 y/o woman as above with MV/TV repair in 1/93, systolic HF EF 79-02%, PAF s/p recent DC-CV,  morbid obesity and CKD IV.   Presented with chest pain and AKI.  Assessment/Plan  Chest Pain Previously had cath in 2019 - Prox LAD lesion is 10% stenosed, Dist LM to Ost LAD lesion is 10% stenosed, Ost Cx lesion is 15% stenosed, -HS Trop 6>Hs trop - EKG with no ischemic changes. Does not appear to be cardiac in nature.  - No chest pain.    2. AKI on CKD Stage IIIb.  Baseline creatinine 2.1-2.4  Creatinine trending up 2.1>2.7>3.1>3.3>3.4>3.4   - Diuretics have been on hold.    3.PAF Has had multiple cardioversion with most recent cardioversion 04/19/21. She was back in NSR but today she is in atrial flutter with controlled rate. In the community followed by EP She has failed Tikosyn and amiodarone.She had Maze in 5/22 and DCCV in 6/22 & 04/19/21, last of which she had ERAF. - Per EP, likely not candidate for PPM or AV nodal ablation -Continue eliquis 5 mg twice a day + Toprol 37.5 mg twice a day + amio 200 mg twice a day.  - IN SR today.     4. Chronic Systolic Heart Failure RHC (5/22): elevated R>L filling pressures with PAPi low, suggestive of significant component of RV dysfunction, prominent v-waves in PCWP and RA tracings suggestive of MR/TR, CO low - RHC (6/22) with low filling pressures with moderately reduced CO. - Echo (7/22) admission in setting of AFL with RVR, showed EF 30-35%, moderate LVH, moderate RV dysfunction, PASP 48, s/p MV repair with mild MR and mean gradient 10 mmHg with HR around 130, s/p TV repair with mild-moderate TR, dilated IVC.  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  On 9/8 she was given IV lasix + metolazone then had metolazone 5 mg x3 days with torsemide 80 mg daily.  Given IV fluids. Improved today. Supp K.  Now with AKI.  - Volume status ok. Hold diuretics .   - No spiro, arni with AKI -   5. Valvular Heart Disease S/p MV and TV repair in 5/22.  - Echo 02/25/21 with mild-moderate TR.  There is only mild MR, but mean gradient across the valve is 10 mmHg  suggesting mitral stenosis   6. Obesity Body mass index is 42.06 kg/m.   7. DMII 01/2021 Last Hgb A1C 7.4  - On SSI  8. Nausea/Vomiting.  ? Viral . Her sister and she also has nausea/vomiting.  I wonder if this is viral illness.  - On PPI.  - Resolved.   9. Abdominal Pain  Non tender on exam.     Length of Stay: 2  Amy Clegg, NP  05/05/2021, 10:45 AM  Advanced Heart Failure Team Pager [phone] (M-F; 7a - 5p)  Please contact [facility] for night-coverage after hours (5p -7a ) and weekends on [email or web address]   Patient seen and examined with the above-signed Advanced Practice Provider and/or Housestaff. I personally reviewed laboratory data, imaging studies and relevant notes. I independently examined the patient and formulated the important aspects of the plan. I have edited the note to reflect any of my changes or salient points. I have personally discussed the plan with the patient and/or family.  She looks better. Feeling some better. Still with some nausea. No vomitting. Denies SOB. Now back in NSR.   Creatinine stable.   General:  Lying in bed  No resp difficulty HEENT: normal Neck: supple. no JVD. Carotids 2+ bilat; no bruits. No lymphadenopathy or thryomegaly appreciated. Cor: PMI nondisplaced. Regular rate & rhythm. No rubs, gallops or murmurs. Lungs: clear Abdomen: obese soft, nontender, nondistended. No hepatosplenomegaly. No bruits or masses. Good bowel sounds. Extremities: no cyanosis, clubbing, rash, edema Neuro: alert & orientedx3, cranial nerves grossly intact. moves all 4 extremities w/o difficulty. Affect pleasant  She is back in NSR. Volume status looks good. She is well perfused on exam so doubt low output. Question whether or not abdominal symptoms related to amio?   Would continue to hold diuretics at least one more day. Ambulate with PT/OT. If creatinine improving may be ready for d/c soon. D/c Teaching Service at bedside.   Glori Bickers, MD  2:15  PM

## 2021-05-05 NOTE — Progress Notes (Signed)
Subjective: No acute events overnight.  Night team was called for abdominal pain.  Pain was located right upper quadrant.  Was given Gas-X with improvement in symptoms.  Patient was seen and examined on rounds this morning.  Endorsing left quadrant abdominal pain.  She denies changes in pain prandial, postprandial, or after a bowel movement.  Has had 2 bowel movements, normal consistency brown.  Nausea has improved. Chest pain is much improved. At home she takes 1 Xanax tablet daily.  We discussed restarting this medication to hopefully make her feel less anxious while she is here in the hospital.  Objective:  Vital signs in last 24 hours: Vitals:   05/05/21 0418 05/05/21 0732 05/05/21 1134 05/05/21 1642  BP: 113/67 137/82 112/60 116/63  Pulse: 71 76 71 69  Resp: 19 15 16 20   Temp: 98.5 F (36.9 C) 97.8 F (36.6 C) 97.7 F (36.5 C) 98 F (36.7 C)  TempSrc: Oral Oral Axillary Oral  SpO2: 97% 95% 100% 97%  Weight:      Height:        Intake/Output Summary (Last 24 hours) at 05/05/2021 1727 Last data filed at 05/05/2021 1504 Gross per 24 hour  Intake 420.79 ml  Output 1450 ml  Net -1029.21 ml    Physical Exam: General: Well appearing obese African-American female, NAD HENT: normocephalic, atraumatic EYES: conjunctiva non-erythematous, no scleral icterus CV: regular rate, normal rhythm, no murmurs, rubs, gallops. 1+ pitting edema bilateral lower extremities, tenderness to left anterior chest wall Pulmonary: sating well on RA, crackles at bilateral lung bases, without wheezes, rhonchi Abdominal: non-distended, soft, epigastric tenderness to palpation, normal BS Skin: Warm and dry, no rashes or lesions Neurological: MS: awake, alert and oriented x3, normal speech and fund of knowledge Motor: moves all extremities antigravity Psych: normal affect   Assessment/Plan:  Active Problems:   Atrial flutter ([place])   Acute kidney injury ([place])  [name] is a 65 y.o.  with 65 y/o F with a PMHx of obesity, chronic diastolic and systolic HF with mildly reduced LVEF (45-50%), severe MR/TR, atrial fibrillation on eliquis, HTN, DMII, HLD, asthma, gout, and OSA who presented for chest pain and pressure and was admitted for an AKI on CKD IV.  #AKI on CKD IV likely 2/2 over diuresis #Hypokalemia Suspect progression of CKD3b to CKD4. Cr 3.1 to 3.2 to 3.4 to 3.3, starting to improve. Suspect prerenal etiology caused by over-diuresis in the setting of recent increase in diuretic therapy.  Patient s/p 750 cc gentle hydration.  Volume status stable. We will continue to need to monitor volume status carefully. She is likely preload dependent with her RV function and very sensitive to changes in her volume status.  -Repeat BMP in the AM -Holding fluids for now -Holding torsemide and metolazone  -Electrolyte repletion as needed, 54meq K scheduled daily  - hold nephrotoxic meds  #Noncardiac chest pain  #GERD Troponins WNL, no ST elevations on EKG .  ACS unlikely.  Suspect chest pain is noncardiac. Previously had cath in 2019 without significant stenosis.  Patient has history of GERD and tenderness to the epigastric palpation this morning.  Patient was given GI cocktail with mild improvement of chest pain.  Suspect pain is secondary to GERD. -Protonix 40 mg twice daily -TUMs PRN -considering outpatient referral to GI for EGD for evaluation of gastritis/PUD  #Nausea and vomiting, improving #Nonspecific abdominal pain Patient with nausea, vomiting 9/13 likely secondary to AKI.  1 episode of nonbloody emesis. Patient afebrile without  leukocytosis, still tolerating p.o. intake. Having regular nonbloody firm bowel movements. Will treat symptomatically.  Symptoms improving. No need for further viral work-up.  Abdominal pain is nonspecific with no triggering or alleviating factors.  Suspect psychosomatic component. -Zofran as needed -Scheduled Colace daily -CT abdomen pelvis with  contrast to rule out other acute causes of abdominal pain in the setting of AKI  #Chronic Systolic Heart Failure  #Chronic Biventricular heart failure, NYHAIIIb Will need some monitor volume status carefully while receiving gentle hydration. She is likely preload dependent with her RV function and very sensitive to changes in her volume status. Guideline directed medical therapy limited by CKD.  Volume status stable. -Cardiology following, appreciate recs -Cardiology recommends holding torsemide additional day with plan to restart torsemide dose 40 mg daily for guideline directed medical therapy - daily weights, strict I's and O's - goal K>4 Mg >2, 6meq K scheduled daily   #Paroxysmal A. Fib/a.flutter #HLD #HTN Has had multiple cardioversions with most recent cardioversion 04/19/21.  Was previously in NSR, EKG showed a flutter on admission.  Patient is in a flutter though rate is controlled.  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  Echo 02/25/2021 with mild mod TR, and mild MR with possible mitral stenosis.    #T2DM #Peripheral neuropathy Patient has history of type 2 diabetes mellitus.  Home medications: Trulicity 1.5 mg once per week, mealtime NovoLog 12 units, Levemir 40 units twice daily.  -SSI resistant -Duloxetine 60mg  daily  #Depression #Anxiety -continue home duloxetine as above -Restarted home Xanax 0.25 mg twice daily as needed  #Gout - holding colchicine, patient does not appear to be in acute flare  #OSA - does not wear CPAP at home  Diet: Heart healthy carb mod VTE: Eliquis IVF: none Code: Full   Prior to Admission Living Arrangement: Home Anticipated Discharge Location: Home Barriers to Discharge: Pending clinical  improvement Dispo: Anticipated discharge in approximately 1-2 day(s).   Wayland Denis, MD 05/05/2021, 5:27 PM Pager: [phone] After 5pm on weekdays and 1pm on weekends: On Call pager [phone]

## 2021-05-06 ENCOUNTER — Ambulatory Visit (HOSPITAL_COMMUNITY): Payer: Medicare Other

## 2021-05-06 DIAGNOSIS — I4892 Unspecified atrial flutter: Secondary | ICD-10-CM | POA: Diagnosis not present

## 2021-05-06 DIAGNOSIS — I5082 Biventricular heart failure: Secondary | ICD-10-CM | POA: Diagnosis not present

## 2021-05-06 DIAGNOSIS — N179 Acute kidney failure, unspecified: Secondary | ICD-10-CM | POA: Diagnosis not present

## 2021-05-06 LAB — BASIC METABOLIC PANEL
Anion gap: 11 (ref 5–15)
BUN: 46 mg/dL — ABNORMAL HIGH (ref 8–23)
CO2: 29 mmol/L (ref 22–32)
Calcium: 9.1 mg/dL (ref 8.9–10.3)
Chloride: 95 mmol/L — ABNORMAL LOW (ref 98–111)
Creatinine, Ser: 2.91 mg/dL — ABNORMAL HIGH (ref 0.44–1.00)
GFR, Estimated: 17 mL/min — ABNORMAL LOW (ref >60)
Glucose, Bld: 116 mg/dL — ABNORMAL HIGH (ref 70–99)
Potassium: 3.4 mmol/L — ABNORMAL LOW (ref 3.5–5.1)
Sodium: 135 mmol/L (ref 135–145)

## 2021-05-06 LAB — GLUCOSE, CAPILLARY
Glucose-Capillary: 94 mg/dL (ref 70–99)
Glucose-Capillary: 118 mg/dL — ABNORMAL HIGH (ref 70–99)

## 2021-05-06 MED ORDER — DAPAGLIFLOZIN PROPANEDIOL 10 MG PO TABS
10.0000 mg | ORAL_TABLET | Freq: Every day | ORAL | Status: DC
  Filled 2021-05-06: qty 1

## 2021-05-06 MED ORDER — TORSEMIDE 20 MG PO TABS: 40.0000 mg | ORAL_TABLET | Freq: Every day | ORAL | 0 refills | Status: DC

## 2021-05-06 MED ORDER — POTASSIUM CHLORIDE CRYS ER 20 MEQ PO TBCR
20.0000 meq | EXTENDED_RELEASE_TABLET | Freq: Once | ORAL | Status: AC
  Administered 2021-05-06: 20 meq via ORAL
  Filled 2021-05-06: qty 1

## 2021-05-06 MED ORDER — POTASSIUM CHLORIDE CRYS ER 20 MEQ PO TBCR
40.0000 meq | EXTENDED_RELEASE_TABLET | Freq: Once | ORAL | Status: AC
  Administered 2021-05-06: 40 meq via ORAL
  Filled 2021-05-06: qty 2

## 2021-05-06 NOTE — Discharge Instructions (Addendum)
You were hospitalized for kidney injury.  Thank you for allowing [place] to be part of your care.   You will need to follow-up with your cardiologist as well as your primary care physician.  Please call your cardiologist's office to make an appointment as soon as possible.  Please note these changes made to your medications:   Please START taking:   Crestor 10 mg daily Eliquis 5 mg twice a day Toprol 37.5 mg daily (1.5 tablets) Torsemide 40 mg daily (2 tablets) Can take additional torsemide 40 mg 2 tablets for increased weight gain as needed Potassium supplement 1 tablet daily   Please STOP taking:  Metolazone Alka-Seltzer

## 2021-05-06 NOTE — Progress Notes (Signed)
Discussed discharge instructions with patient, all questions answered and she verbalized understanding. Pt given all belongings. Pt left unit in wheelchair.

## 2021-05-06 NOTE — Progress Notes (Signed)
Advanced Heart Failure Rounding Note  PCP-Cardiologist: Buford Dresser, MD   Subjective:    05/02/21 Presented to ED with chest pain. HS Trop 6. Creatinine was 3.1. Diuretics held. In A flutter with controlled rate on admit.  9/13 - Given IV fluids. 9/14 Given IV fluids.    Creatinine 3.1>3.3 >3.3> 3.4> 3.4 > 2.9   Still with some ab pain. No N/V. No weight recorded. Remains off diuretics. Appears to be in sinus.   CT ab/pelvis yesterday was normal.   Objective:   Weight Range: 121.8 kg Body mass index is 42.06 kg/m.   Vital Signs:   Temp:  [97.7 F (36.5 C)-98.3 F (36.8 C)] 98.1 F (36.7 C) (09/16 0737) Pulse Rate:  [69-72] 70 (09/16 0737) Resp:  [16-20] 19 (09/16 0737) BP: (103-123)/(60-96) 103/64 (09/16 0737) SpO2:  [97 %-100 %] 100 % (09/16 0737) Last BM Date: 05/04/21  Weight change: Filed Weights   05/02/21 1418 05/03/21 2019  Weight: 120.2 kg 121.8 kg    Intake/Output:   Intake/Output Summary (Last 24 hours) at 05/06/2021 0832 Last data filed at 05/06/2021 0753 Gross per 24 hour  Intake 300 ml  Output 2400 ml  Net -2100 ml       Physical Exam   General:  Lying in bed No resp difficulty HEENT: normal Neck: supple. no JVD. Carotids 2+ bilat; no bruits. No lymphadenopathy or thryomegaly appreciated. Cor: PMI nondisplaced. Regular rate & rhythm. No rubs, gallops or murmurs. Lungs: clear Abdomen: obese soft, nontender, nondistended. No hepatosplenomegaly. No bruits or masses. Good bowel sounds. Extremities: no cyanosis, clubbing, rash, edema Neuro: alert & orientedx3, cranial nerves grossly intact. moves all 4 extremities w/o difficulty. Affect pleasant  Telemetry    NSR 70-80s  Personally reviewed   Labs    CBC Recent Labs    05/04/21 0015  WBC 6.7  HGB 11.7*  HCT 35.8*  MCV 85.6  PLT 315    Basic Metabolic Panel Recent Labs    05/04/21 0015 05/04/21 1627 05/05/21 0119 05/06/21 0024  NA 137   < > 137 135  K 3.3*   <  > 3.7 3.4*  CL 93*   < > 97* 95*  CO2 34*   < > 31 29  GLUCOSE 103*   < > 108* 116*  BUN 51*   < > 52* 46*  CREATININE 3.33*   < > 3.43* 2.91*  CALCIUM 9.5   < > 9.3 9.1  MG 2.3  --   --   --    < > = values in this interval not displayed.    Liver Function Tests No results for input(s): AST, ALT, ALKPHOS, BILITOT, PROT, ALBUMIN in the last 72 hours.  No results for input(s): LIPASE, AMYLASE in the last 72 hours. Cardiac Enzymes No results for input(s): CKTOTAL, CKMB, CKMBINDEX, TROPONINI in the last 72 hours.  BNP: BNP (last 3 results) Recent Labs    02/25/21 0909 04/28/21 1059 05/03/21 0730  BNP 162.7* 88.7 32.9     ProBNP (last 3 results) No results for input(s): PROBNP in the last 8760 hours.   D-Dimer No results for input(s): DDIMER in the last 72 hours. Hemoglobin A1C No results for input(s): HGBA1C in the last 72 hours.  Fasting Lipid Panel No results for input(s): CHOL, HDL, LDLCALC, TRIG, CHOLHDL, LDLDIRECT in the last 72 hours.  Thyroid Function Tests No results for input(s): TSH, T4TOTAL, T3FREE, THYROIDAB in the last 72 hours.  Invalid input(s): FREET3  Other results:   Imaging    CT ABDOMEN PELVIS WO CONTRAST  Result Date: 05/06/2021 CLINICAL DATA:  Left lower quadrant abdominal pain EXAM: CT ABDOMEN AND PELVIS WITHOUT CONTRAST TECHNIQUE: Multidetector CT imaging of the abdomen and pelvis was performed following the standard protocol without IV contrast. COMPARISON:  03/04/2020 FINDINGS: Lower chest: Mild linear scarring/atelectasis in the left lower lobe. Hepatobiliary: Unenhanced liver is unremarkable. Status post cholecystectomy. No intrahepatic or extrahepatic ductal dilatation. Pancreas: Within normal limits. Spleen: Within normal limits. Adrenals/Urinary Tract: Mild thickening of the left adrenal gland. Right adrenal gland is within normal limits. Kidneys are within normal limits.  No hydronephrosis. Bladder is within normal limits.  Stomach/Bowel: Stomach is notable for a tiny hiatal hernia. No evidence of bowel obstruction. Normal appendix (series 3/image 67). No colonic wall thickening or inflammatory changes. Vascular/Lymphatic: No evidence of abdominal aortic aneurysm. Atherosclerotic calcifications of the abdominal aorta and branch vessels. 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 No suspicious abdominopelvic lymphadenopathy. Reproductive: Status post hysterectomy. Bilateral ovaries are within normal limits. Other: No abdominopelvic ascites. Musculoskeletal: Degenerative changes of the lower thoracic spine. IMPRESSION: No CT findings to account for the patient's left lower quadrant abdominal pain. No colonic wall thickening or inflammatory changes. No evidence of bowel obstruction.  Normal appendix. Electronically Signed   By: Julian Hy M.D.   On: 05/06/2021 00:52     Medications:     Scheduled Medications:  amiodarone  200 mg Oral BID   apixaban  5 mg Oral BID   diclofenac Sodium  2 g Topical QID   docusate sodium  100 mg Oral Daily   DULoxetine  60 mg Oral Daily   insulin aspart  0-20 Units Subcutaneous TID with meals   lidocaine  1 patch Transdermal Q24H   metoprolol succinate  37.5 mg Oral Daily   pantoprazole  40 mg Oral BID   potassium chloride SA  40 mEq Oral Daily   rosuvastatin  10 mg Oral Daily    Infusions:   PRN Medications: acetaminophen **OR** acetaminophen, albuterol, ALPRAZolam, calcium carbonate, ondansetron (ZOFRAN) IV    Patient Profile    65 y/o woman as above with MV/TV repair in 9/83, systolic HF EF 38-25%, PAF s/p recent DC-CV, morbid obesity and CKD IV.   Presented with chest pain and AKI.  Assessment/Plan  Chest Pain Previously had cath in 2019 - Prox LAD lesion is 10% stenosed, Dist LM to Ost LAD lesion is 10% stenosed, Ost Cx lesion is 15% stenosed, -HS Trop 6>Hs trop - EKG with no ischemic changes. Does not appear to be  cardiac in nature.  - No chest pain  2. AKI on CKD Stage IIIb.  - Baseline creatinine 2.1-2.4  - Creatinine trending up 2.1>2.7>3.1>3.3>3.4>3.4 > 2.9 . Suspect overdiuresis. ATN - Diuretics have been on hold.  - creatinine improving   3.PAF Has had multiple cardioversion with most recent cardioversion 04/19/21. She was back in NSR but today she is in atrial flutter with controlled rate. In the community followed by EP She has failed Tikosyn and amiodarone.She had Maze in 5/22 and DCCV in 6/22 & 04/19/21, last of which she had ERAF. - Per EP, likely not candidate for PPM or AV nodal ablation -Continue eliquis 5 mg twice a day + Toprol 37.5 mg twice a day + amio 200 mg twice a day.  - Appears to be in NSR today   4. Chronic Systolic Heart Failure RHC (5/22): elevated  R>L filling pressures with PAPi low, suggestive of significant component of RV dysfunction, prominent v-waves in PCWP and RA tracings suggestive of MR/TR, CO low - RHC (6/22) with low filling pressures with moderately reduced CO. - Echo (7/22) in setting of AFL with RVR, showed EF 30-35%, moderate LVH, moderate RV dysfunction, PASP 48, s/p MV repair with mild MR and mean gradient 10 mmHg with HR around 130, s/p TV repair with mild-moderate TR, dilated IVC.  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  - Suspect overdiuresed on admit - No spiro, arni with AKI - BP low     5. Valvular Heart Disease S/p MV and TV repair in 5/22.  - Echo 02/25/21 with mild-moderate TR.  There is only mild MR, but mean gradient across the valve is 10 mmHg suggesting mitral stenosis   6. Obesity Body mass index is 42.06 kg/m.   7. DMII 01/2021 Last Hgb A1C 7.4  - On SSI  8. Ab pain eith Nausea/Vomiting.  - CT negative - ? Amio side effect   I think she is stable for d/c from HF perspective    HF Meds for d/c  Crestor 10 Eliquis 5 bid Toprol 37.5 daily Amio 200 bid Torsemide 40 daily. (Can take extra for weight gain) Kdur 20  daily  Eventually would like to switch torsemide to [place] when GFR > 20. No bidil yet with low BP.   Glori Bickers, MD  8:55 AM     Length of Stay: 3  Glori Bickers, MD  05/06/2021, 8:32 AM  Advanced Heart Failure Team Pager [phone] (M-F; 7a - 5p)  Please contact [facility] for night-coverage after hours (5p -7a ) and weekends on [email or web address]

## 2021-05-06 NOTE — Discharge Summary (Signed)
Name: [name] MRN: [id number] DOB: [birth year] 65 y.o. PCP: [name], NP  Date of Admission: 05/02/2021  2:10 PM Date of Discharge: 05/06/21 Attending Physician: Sid Falcon, MD  Discharge Diagnosis: 1.  AKI on CKD stage IIIb 2.  Nonspecific abdominal pain 3.  Noncardiac chest pain 4.  Hypokalemia 5.  Nausea and vomiting  Discharge Medications: Allergies as of 05/06/2021       Reactions   Sulfa Antibiotics Itching        Medication List     STOP taking these medications    ALKA-SELTZER PO   metolazone 5 MG tablet Commonly known as: ZAROXOLYN       TAKE these medications    Accu-Chek Guide Me w/Device Kit Use as instructed to check blood sugar three times daily. E11.69   OneTouch Verio w/Device Kit Use to check blood sugar TID. E11.69   Accu-Chek Softclix Lancets lancets Use as instructed to check blood sugar three times daily. J33.54   OneTouch Delica Lancets 56Y Misc Use to check blood sugar TID.  E11.69   albuterol 108 (90 Base) MCG/ACT inhaler Commonly known as: VENTOLIN HFA Inhale 2 puffs into the lungs every 4 (four) hours as needed (Asthma).   ALPRAZolam 0.5 MG tablet Commonly known as: XANAX Take 1 tablet (0.5 mg total) by mouth 2 (two) times daily as needed for anxiety.   amiodarone 200 MG tablet Commonly known as: Pacerone Take 1 tablet (200 mg total) by mouth 2 (two) times daily. What changed: how much to take   apixaban 5 MG Tabs tablet Commonly known as: Eliquis Take 1 tablet (5 mg total) by mouth 2 (two) times daily.   B-D SINGLE USE SWABS REGULAR Pads 1 each by Other route daily.   B-D ULTRAFINE III SHORT PEN 31G X 8 MM Misc Generic drug: Insulin Pen Needle 1 each by Other route in the morning, at noon, in the evening, and at bedtime.   blood glucose meter kit and supplies Kit Dispense based on [name] and insurance preference. Use up to four times daily as directed.   colchicine 0.6 MG  tablet Taking 1/2 tablet by mouth daily   docusate sodium 250 MG capsule Commonly known as: COLACE Take 100 mg by mouth daily.   DULoxetine 60 MG capsule Commonly known as: CYMBALTA Take 1 capsule (60 mg total) by mouth daily.   EPINEPHrine 0.3 mg/0.3 mL Soaj injection Commonly known as: EPI-PEN Inject 0.3 mg into the muscle as needed for anaphylaxis.   insulin aspart 100 UNIT/ML injection Commonly known as: NovoLOG Inject 12 Units into the skin 3 (three) times daily before meals. For blood sugars 0-199 give 0 units of insulin, 201-250 give 4 units, 251-300 give 6 units, 301-350 give 8 units, 351-400 give 10 units,> 400 give 12 units and call M.D. What changed: how much to take   insulin detemir 100 UNIT/ML injection Commonly known as: Levemir INJECT 0.4 MLS (40 UNITS TOTAL) INTO THE SKIN 2 (TWO) TIMES DAILY. What changed:  how much to take how to take this when to take this additional instructions   metoprolol succinate 25 MG 24 hr tablet Commonly known as: Toprol XL Take 1.5 tablets (37.5 mg total) by mouth daily.   MULTIVITAMIN WOMEN PO Take 1 tablet by mouth daily.   nitroGLYCERIN 0.4 MG SL tablet Commonly known as: NITROSTAT Place 1 tablet (0.4 mg total) under the tongue every 5 (five) minutes x 3 doses as needed for chest pain.  ondansetron 4 MG disintegrating tablet Commonly known as: Zofran ODT Take 1 tablet (4 mg total) by mouth every 8 (eight) hours as needed for nausea or vomiting.   OneTouch Verio test strip Generic drug: glucose blood Use to check blood sugar TID. E11.69   pantoprazole 40 MG tablet Commonly known as: PROTONIX Take 40 mg by mouth 2 (two) times daily.   potassium chloride SA 20 MEQ tablet Commonly known as: KLOR-CON Take 2 tablets (40 mEq total) by mouth daily. Take an additional 40 meq with every dose of METOLAZONE   Restasis 0.05 % ophthalmic emulsion Generic drug: cycloSPORINE Place 1 drop into both eyes 2 (two) times daily.    rosuvastatin 10 MG tablet Commonly known as: CRESTOR Take 1 tablet (10 mg total) by mouth daily.   torsemide 20 MG tablet Commonly known as: DEMADEX Take 2 tablets (40 mg total) by mouth daily. Can take an additional 2 tablets as needed for increased weight gain What changed:  how much to take additional instructions   Trulicity 1.5 CX/4.4YJ Sopn Generic drug: Dulaglutide Inject 1.5 mg into the skin once a week.        Disposition and follow-up:   [name] was discharged from [facility] in Stable condition.  At the hospital follow up visit please address:  AKI on CKD: Follow-up BMP at outpatient primary care visit. Nonspecific abdominal pain, Noncardiac chest pain: [name] can consider discussing with PCP for need for GI referral for endoscopy to further work-up noncardiac chest pain nonspecific abdominal pain, concern for gastritis/PUD.  There may be a psychosomatic component to abdominal and chest pain given [name]'s history of anxiety and intermittent anxious affect. Chronic Biventricular heart failure, NYHAIIIb: [name] will need to follow-up with with her cardiologist for further chronic heart failure management.  2.  Labs / imaging needed at time of follow-up: BMP  3.  Pending labs/ test needing follow-up: none  Follow-up Appointments:  Follow-up Information     [name], NP. Schedule an appointment as soon as possible for a visit in 2 week(s).   Specialty: Internal Medicine Contact information: [place] [address] [phone]         Buford Dresser, MD. Schedule an appointment as soon as possible for a visit in 1 week(s).   Specialty: Cardiology Contact information: [address] [place] [address] [phone]                 Hospital Course by problem list:  [name] is a 65 y.o. with 65 y/o F with a PMHx of obesity, chronic diastolic and  systolic HF with mildly reduced LVEF (45-50%), severe MR/TR, atrial fibrillation on eliquis, HTN, DMII, HLD, asthma, gout, and OSA who presented for chest pain and pressure and was admitted for an AKI on CKD IV.  #AKI on CKD IV likely 2/2 over diuresis #Hypokalemia Suspect progression of CKD3b to CKD4.  On arrival BUN 44 creatinine 3.12.  [name]'s baseline 2.4.  Suspect prerenal etiology caused by over-diuresis in the setting of recent increase in diuretic therapy.  Cardiology was consulted.  Diuretics were held in the setting of AKI. [name] s/p 1250 cc gentle hydration.  Volume status stable. Cr 3.1 initially increased to 3.4 before decreasing down to 2.9 at discharge.  With her history of heart failure, [name] is likely preload dependent with her RV function and very sensitive to changes in her volume status.  Potassium was low to 3.4 and was repleted  to reach goal K greater than 4. [name] was deemed stable for discharge both by cardiology and internal medicine on 9/16.  She will need to follow-up with her cardiologist outpatient.  At discharge [name] will restart torsemide 40 mg daily with additional as needed dosing as needed for weight gain.  Metolazone will be held.  She will continue taking potassium supplementation 20 mEq daily.  #Noncardiac chest pain  #GERD [name] presented for chest pain.  Troponins WNL, no ST elevations on EKG .  ACS unlikely.  Suspect chest pain is noncardiac. Previously had cath in 2019 without significant stenosis.  [name] has history of GERD and + tenderness to the epigastric palpation.  [name] was given GI cocktail with mild improvement of chest pain.  Suspect pain is secondary to GERD.  [name] was started on Protonix 40 mg twice daily as well as Tums as needed.  [name] was encouraged to discuss with primary care provider about referral to GI for EGD for evaluation of gastritis/PUD.  There is possibly a psychosomatic component to her chest pain and her history of  anxiety, and intermittent anxious affect.  #Nausea and vomiting, improving #Nonspecific abdominal pain [name] with nausea, vomiting 9/13 likely secondary to AKI.  1 episode of nonbloody emesis. [name] afebrile without leukocytosis, still tolerating p.o. intake. Having regular nonbloody firm bowel movements.  He was treated symptomatically.  Symptoms improved, no indication for further viral work-up.  Abdominal pain is nonspecific with no triggering or alleviating factors.  Suspect psychosomatic component.  [name] was given Zofran as needed as well as as scheduled Colace daily for regular bowel movements.  CT abdomen pelvis with contrast was completed with no CT findings to account for the [name]'s abdominal pain.  [name]'s abdominal pain and nausea vomiting have resolved at discharge. No further medical management required.  #Chronic Biventricular heart failure, NYHAIIIb [name] follows with cardiology for history of heart failure. She is likely preload dependent with her RV function and very sensitive to changes in her volume status. Guideline directed medical therapy limited by CKD.  Volume status during admission stable: Bibasilar crackles with trace bilateral lower extremity edema.  Cardiology was consulted for this [name] and provided recommendations for diureses.  Diuretics were held this admission in the setting of AKI.  Once kidney function improved, [name] was stable for discharge and will restart torsemide dose 40 mg once daily.  Metolazone will be held at discharge.  She will require close follow-up with her cardiologist.  Elveria Rising Fib/a.flutter #HLD #HTN [name] has had multiple cardioversions with most recent cardioversion 04/19/21.  Was previously in NSR, EKG showed a flutter on this admission.  [name] is in a flutter throughout this admission, though rate is controlled. Per Ep. She is likely not a candidate for PPM or AV nodal ablation.  Continued on Eliquis 5 mg twice daily,  metoprolol 37.5 mg twice daily, amiodarone 200 mg twice daily, Crestor 10 mg daily.  [name] will discharge on the same home medications.  #Valvular Heart Disease  [name] has history of mitral valve and tricuspid valve repair in 5/22.  Echo 02/25/2021 with mild mod TR, and mild MR with possible mitral stenosis.     #T2DM #Peripheral neuropathy [name] has history of type 2 diabetes mellitus.  Home medications: Trulicity 1.5 mg once per week, mealtime NovoLog 12 units, Levemir 40 units twice daily.  [name] was started on SSI resistant for blood glucose management while inpatient.  She was restarted on duloxetine 60 mg daily for peripheral neuropathy.  At discharge she will continue her home medications.  #Depression #Anxiety [name] was continued on home duloxetine 60 mg daily for depression, peripheral neuropathy.  [name] has significant anxiety which may be contributing to some of her abdominal or chest pain.  At outpatient follow-up with primary care provider, could consider switching the SNRI with SSRI which would better treat anxiety and consider alternative medication for peripheral neuropathy.   #Gout  Colchicine was held since [name] does not appear to be in acute flare.  [name] was restarted on home medication at discharge.   #OSA [name] does not wear CPAP at home.  No further medical management.   Objective on day of discharge: No acute concerns or events overnight.  [name] was examined and seen this morning on rounds.  She denies chest pain and abdominal pain this morning, those have resolved.  She denies nausea vomiting.  She denies SOB.  Her leg swelling is unchanged.  He is amendable to going home today.  Discharge Exam:   BP (!) 122/93 (BP Location: Right Arm)   Pulse 71   Temp 98.4 F (36.9 C) (Oral)   Resp 20   Ht $R'5\' 7"'xD$  (1.702 m)   Wt 121.8 kg   SpO2 99%   BMI 42.06 kg/m   Pertinent Labs, Studies, and Procedures:  Physical Exam: General: Well appearing obese  African-American female, NAD HENT: normocephalic, atraumatic, no JVD EYES: conjunctiva non-erythematous, no scleral icterus CV: regular rate, normal rhythm, no murmurs, rubs, gallops. 1+ pitting edema bilateral lower extremities Pulmonary: sating well on RA, crackles at bilateral lung bases, without wheezes, rhonchi Abdominal: non-distended, soft, mild LLQ tenderness to palpation, normal BS Skin: Warm and dry, no rashes or lesions Neurological: MS: awake, alert and oriented x3, normal speech and fund of knowledge Motor: moves all extremities antigravity Psych: normal affect  CBC Latest Ref Rng & Units 05/04/2021 05/03/2021 05/02/2021  WBC 4.0 - 10.5 K/uL 6.7 6.4 6.6  Hemoglobin 12.0 - 15.0 g/dL 11.7(L) 11.8(L) 13.1  Hematocrit 36.0 - 46.0 % 35.8(L) 36.7 41.3  Platelets 150 - 400 K/uL 216 226 277   BMP Latest Ref Rng & Units 05/06/2021 05/05/2021 05/04/2021  Glucose 70 - 99 mg/dL 116(H) 108(H) 144(H)  BUN 8 - 23 mg/dL 46(H) 52(H) 53(H)  Creatinine 0.44 - 1.00 mg/dL 2.91(H) 3.43(H) 3.44(H)  BUN/Creat Ratio 12 - 28 - - -  Sodium 135 - 145 mmol/L 135 137 137  Potassium 3.5 - 5.1 mmol/L 3.4(L) 3.7 4.1  Chloride 98 - 111 mmol/L 95(L) 97(L) 98  CO2 22 - 32 mmol/L $RemoveB'29 31 28  'KXwAmXTe$ Calcium 8.9 - 10.3 mg/dL 9.1 9.3 9.4   Brain natriuretic peptide [[id number]] Collected: 05/03/21 0730  Specimen: Blood Updated: 05/03/21 1810   B Natriuretic Peptide 32.9 pg/mL    Magnesium [[id number]] Collected: 05/04/21 0015  Specimen: Blood Updated: 05/04/21 0238   Magnesium 2.3 mg/dL     Discharge Instructions: Discharge Instructions     Call MD for:  difficulty breathing, headache or visual disturbances   Complete by: As directed    Call MD for:  persistant nausea and vomiting   Complete by: As directed        Signed: Wayland Denis, MD 05/06/2021, 3:29 PM   Pager: [phone]

## 2021-05-06 NOTE — Care Management Important Message (Signed)
Important Message  Patient Details  Name: [name] MRN: [id number] Date of Birth: [birth year]   Medicare Important Message Given:  Yes     Orbie Pyo 05/06/2021, 3:21 PM

## 2021-05-09 ENCOUNTER — Telehealth: Payer: Self-pay

## 2021-05-09 ENCOUNTER — Ambulatory Visit (HOSPITAL_COMMUNITY): Payer: Medicare Other

## 2021-05-09 ENCOUNTER — Telehealth (HOSPITAL_COMMUNITY): Payer: Self-pay | Admitting: Licensed Clinical Social Worker

## 2021-05-09 NOTE — Telephone Encounter (Signed)
Transition Care Management Follow-up Telephone Call Date of discharge and from where: 05/06/2021, [facility]  How have you been since you were released from the hospital? She said she is feeling good Any questions or concerns? NO Items Reviewed: Did the pt receive and understand the discharge instructions provided? Yes  Medications obtained and verified? Yes - she said she has all medications and did not have any questions about her med regime.  She said she is aware of how to take them  Other? No  Any new allergies since your discharge? No  Do you have support at home? Yes daughter   Home Care and Equipment/Supplies: Were home health services order? NA   Has a glucometer at home   Functional Questionnaire: (I = Independent and D = Dependent) ADLs: independent. Uses cane or walker with ambulation.     Follow up appointments reviewed:   PCP Hospital f/u appt confirmed? Yes  Scheduled to see Juluis Mire, NP  on 05/24/2021  Specialist Hospital f/u appt confirmed? Yes   Are transportation arrangements needed? No  If their condition worsens, is the pt aware to call PCP or go to the Emergency Dept.? Yes Was the patient provided with contact information for the PCP's office or ED? Yes Was to pt encouraged to call back with questions or concerns? Yes

## 2021-05-09 NOTE — Progress Notes (Signed)
Heart and Vascular Care Navigation  05/09/2021  [name] 1955-09-16 [id number]  Reason for Referral: Enrollment in [place] with patient by telephone for initial visit for Heart and Vascular Care Coordination.                                                                                                   Paramedicine Initial Assessment:  Do you live with anyone? Lives with dtr and grandchildren  Income:  What is your current source of income? Social security retirement income  Transportation:  Do you have transportation to your medical appointments? Yes- has a friend that drives her to and from appts   Daily Health Needs: Do you have a working scale at home? yes  How do you manage your medications at home? Usually takes them right out of the bottle  Do you ever take your medications differently than prescribed? no  Do you have issues affording your medications? no  Do you have any concerns with mobility at home? no  Do you use any assistive devices at home or have PCS at home? Uses a cane- did have home health prior to hospital stay  Do you have a PCP? [name]  Are there any additional barriers you see to getting the care you need? no                                      HRT/VAS Care Coordination     Outpatient Care Team Social Worker; Community Paramed[facility]   Social Worker Name: [name]- [facility]- [phone]   Living arrangements for the past 2 months Apartment   Lives with: Adult Children; Relatives   Patient Current Insurance Coverage Managed Medicare; Medicaid   Patient Has Concern With Paying Medical Bills No   Does Patient Have Prescription Coverage? Yes   Home Assistive Devices/Equipment Cane (specify quad or straight); Walker (specify type)  straight cane, wheels   DME Agency NA   [name]   Current home services DME  Patient has rolling walker and Bedside commode.        Social History:                                                                             SDOH Screenings   Alcohol Screen: Low Risk    Last Alcohol Screening Score (AUDIT): 0  Depression (PHQ2-9): Medium Risk   PHQ-2 Score: 9  Financial Resource Strain: Low Risk    Difficulty of Paying Living Expenses: Not very hard  Food Insecurity: No Food Insecurity   Worried About Charity fundraiser in the Last Year: Never true   Ran Out of Food in  the Last Year: Never true  Housing: Low Risk    Last Housing Risk Score: 0  Physical Activity: Not on file  Social Connections: Not on file  Stress: Not on file  Tobacco Use: Low Risk    Smoking Tobacco Use: Never   Smokeless Tobacco Use: Never  Transportation Needs: No Transportation Needs   Lack of Transportation (Medical): No   Lack of Transportation (Non-Medical): No    Follow-up plan:     CSW to send out referral to [employer] paramedics for assignment  [name], [facility] Desk#: [phone] Cell#: [phone]

## 2021-05-11 ENCOUNTER — Ambulatory Visit (HOSPITAL_COMMUNITY): Payer: Medicare Other

## 2021-05-12 ENCOUNTER — Other Ambulatory Visit (INDEPENDENT_AMBULATORY_CARE_PROVIDER_SITE_OTHER): Payer: Self-pay | Admitting: Primary Care

## 2021-05-12 ENCOUNTER — Other Ambulatory Visit: Payer: Self-pay | Admitting: Cardiology

## 2021-05-12 DIAGNOSIS — E1142 Type 2 diabetes mellitus with diabetic polyneuropathy: Secondary | ICD-10-CM

## 2021-05-12 DIAGNOSIS — I5041 Acute combined systolic (congestive) and diastolic (congestive) heart failure: Secondary | ICD-10-CM

## 2021-05-12 NOTE — Telephone Encounter (Signed)
Sent to PCP ?

## 2021-05-12 NOTE — Telephone Encounter (Signed)
D/C 09/08/20

## 2021-05-13 ENCOUNTER — Telehealth (HOSPITAL_COMMUNITY): Payer: Self-pay

## 2021-05-13 ENCOUNTER — Ambulatory Visit (HOSPITAL_COMMUNITY): Payer: Medicare Other

## 2021-05-13 NOTE — Telephone Encounter (Signed)
Spoke to Bowler who agreed to meet in the clinic on Monday Sept. 26th at 12:00 to discuss paramedicine more at length and to possibly set up home visit. Call complete.

## 2021-05-13 NOTE — Telephone Encounter (Signed)
Called to confirm/remind patient of their appointment at the [facility] on 05/16/21.   Patient reminded to bring all medications and/or complete list.  Confirmed patient has transportation. Gave directions, instructed to utilize [place] parking.  Confirmed appointment prior to ending call.

## 2021-05-15 NOTE — Progress Notes (Addendum)
Advanced Heart Failure Clinic Note    PCP: [name], NP PCP-Cardiologist: Buford Dresser, MD  [facility]: Dr. [name]   Reason for Visit: [facility] f/u for chronic systolic heart failure   HPI: [name] is a 65 y.o. with a history of morbid obesity, chronic diastolic and systolic HF with mildly reduced LVEF (45-50%), severe MR/TR, atrial fibrillation on Eliquis, HTN, and DMII who is followed by Dr. Harrell Gave as an out-patient.    She has a known history of HF with mildly reduced LVEF of 45-50% with cath in 2019 with mild, non-obstructive CAD. Also with history of difficult to control Afib with failed Tikosyn, later switched to amiodarone, dig, metop and apixaban for [facility]. Saw Dr. Harrell Gave in clinic on 11/22/20 where she was experiencing worsening SOB, weight gain, LE edema and chest pressure. She declined ER visit at that time. She was changed from lasix to torsemide for diuresis. Also planned for CV surgery evaluation for severe MR.    Admitted 12/19/20 with worsening HF.  LHC 5/19 non-obstructive CAD. RHC 12/22/20 with elevated filling pressures and low CI.    Underwent MV/TV repair with Maze on 12/30/20. Post-operatively, she struggled  with low co-ox, progressive renal failure and volume overload. AHF team was consulted to assist w/ further management. Post operative Echo showed LVEF 55% RV moderately HK. Moderate TR. No significant MR. No effusion. Hemodynamics improved w/ milrinone. Suspected of having post-op ATN. Nephrology assisted w/ transition back to PO diuretics. She was discharged on 5/28. D/w wt was 281 lb. SCr was 2.8. Nephrology recommended home on torsemide 60 mg daily. She was also in Afib day of d/c. Recommendations were to consider DC-CV in several weeks after atrial irritation settles down from Maze.   She was seen in clinic 01/25/21 for post hospital f/u. ReDs clip 42%. Plan for DCCV.   She was seen in ED 01/29/21 with SOB and CP, with decreased  urinary output. She was admitted for A/CHF, diuresed with IV lasix, RHC showed low filling pressures and moderately reduced CO. She underwent successful DCCV 02/01/21. 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 She was discharged with CR referral and instructions to follow up with nephrology.   Seen in ED 02/07/21 for leg swelling & CP. She was back in Afib, rate controlled. She was loaded w amiodarone gtt, repeat successful DCCV on 02/09/21. 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 If she returned to Afib, would need evaluation for AV node ablation/PPM.   She was back in A fib and volume overloaded 02/22/21 at follow up. Torsemide was increased to 80 mg bid and amio increased to 200 mg bid. (She did not tell staff she was out of amio).    She returned to HF clinic 2 days later and was in A fib RVR with rate in the 130s. SOB with minimal exertion. She was sent to the ED for further evaluation. EP consulted. She converted to NSR on IV amiodarone. SCr trended up and diuretics held. Repeat echo showed new reduction in EF, in setting of AFL with RVR,  EF 30-35%, moderate LVH, moderate RV dysfunction. Transitioned to po amio and torsemide, discharge weight 271 lbs.   Hospital f/u 7/22 NYHA II-early III, volume good. Weight 275 lbs.   S/p DCCV 04/19/21, but back in afib at follow up a week later at [facility] appointment. Weight up 18 lbs, and more SOB. EP felt nothing else to  offer her to maintain SR. Beta Blocker increased and amio 400 bid continued. We suggested increasing torsemide to 80 mg bid with close HF follow up.   She was seen in the HF clinic on 04/28/21. Volume overloaded and given 80 mg IV lasix + 5 mg metolazone in the clinic with good response. She was then instructed 5 mg daily x3 day with 80 mg torsemide. EKG during the visit showed SR. Cardiomems was also considered.   Admitted 9/12-9/16/22 with CP and AKI on CKD  IV. SCr 3.12 in ED, likely due to over diuresis. AHF consulted, diuretics held, and she received gentle IV hydration. She remained in AFL rate controlled.  CP felt to be non-cardiac in nature and possibly psychosomatic. She had N/V, CT ab/pelvis was normal. Symptoms improved with GI cocktail and she was started on PPI at discharge. SCr trended back to her baseline (2.9 day of d/c) her metolazone was stopped and home dose of HF medications were continued at discharge, weight 268 lbs.  Today she returns for post hospitalization HF follow up. She reports weight gain, LEE and increased dyspnea, NYHA Class IIIb. Also complains of orthopnea. Denies CP. No palpitations. Wt up 15 lb 268>>293 lb. ReDs Clip 46%. Reports full med compliance but sluggish UOP despite torsemide 80 mg daily.   EKG shows NSR, 79 bpm. BP 138/72.   Review of systems complete and found to be negative unless listed in HPI.     Cardiac Studies - Echo (11/13): EF 60-65%, moderate TR - Echo (11/14): EF 60-65%, severe TR, PA peak pressure 47 mmHg - Echo (5/15): EF 50-55% - Exercise stress test (7/6): A. Fib. with controlled vent. response OLD inferior MI and non sp. T wave changes at baseline. - Echo (8/17): EF 50-55%, moderate MR - Exercise stress test (8/17): Atrial Fib. Q wave in lead 3 and AVF with nonsp T wave changes at baseline - Echo (5/19): EF 55-60%, moderate MR - Exercise stress test (5/19): Afib, normal. - LHC (5/19): Prox LAD lesion is 10% stenosed, Dist LM to Ost LAD lesion is 10% stenosed, Ost Cx lesion is 15% stenosed, LV systolic function normal, LVEDP is normal. - Echo (11/19): EF 45-50%, severe MR/TR - Echo (11/21): EF 45-50%, severe MR (rheumatic)/TR - Echo (4/22): EF 45-50%, severe MR mean gradient 2.0 mmHg, severe TR - Echo (5/22): EF 60-65%, s/p MVR, trace MR mean gradient 7.0 mmHg, moderate TR - RHC (5/22): Elevated R > L heart filling pressures with PAPi low but not markedly so (1.8) suggestive of a  significant component of RV dysfunction. Prominent v-waves in PCWP and RA pressure tracings suggestive of significant MR and TR. Cardiac index low. - RHC (6/22): low filling pressures, moderate reduced CO. - Echo (7/22): EF 30-35%, moderate LVH, moderate RV dysfunction, PASP 48, s/p MV repair with mild MR and mean gradient 10 mmHg with HR around 130, s/p TV repair with mild-moderate TR, dilated IVC.   RHC 01/31/21  RA = 5 RV = 33/2 PA = 35/13 (22) PCW = 13 Fick cardiac output/index = 4.3/2.0 PVR = 2.1 WU FA sat = 97% PA sat = 54%, 54% - DCCV (02/01/21): successful conversion to NSR. - DCCV (02/09/21): successful conversion to NSR. -DCCV (830/22): successful conversion to NSR.  ROS: All systems negative except as listed in HPI, PMH and Problem List.  SH:  Social History   Socioeconomic History   Marital status: Significant Other    Spouse name: Not on file   Number of  children: 2   Years of education: Not on file   Highest education level: Some college, no degree  Occupational History   Not on file  Tobacco Use   Smoking status: Never   Smokeless tobacco: Never  Vaping Use   Vaping Use: Never used  Substance and Sexual Activity   Alcohol use: No   Drug use: No   Sexual activity: Not Currently  Other Topics Concern   Not on file  Social History Narrative   Pt lives in [place] with spouse.  2 grown children.   Previously worked in [employer] at [employer].  Now on disability   Social Determinants of Health   Financial Resource Strain: Low Risk    Difficulty of Paying Living Expenses: Not very hard  Food Insecurity: No Food Insecurity   Worried About Charity fundraiser in the Last Year: Never true   Ran Out of Food in the Last Year: Never true  Transportation Needs: No Transportation Needs   Lack of Transportation (Medical): No   Lack of Transportation (Non-Medical): No  Physical Activity: Not on file  Stress: Not on file  Social Connections: Not on file  Intimate  Partner Violence: Not on file   FH:  Family History  Problem Relation Age of Onset   Cancer Mother    Hypertension Mother    Cancer Father    CAD Other    Hypertension Sister    Hypertension Brother    Past Medical History:  Diagnosis Date   Allergic rhinitis    Arthritis    Asthma    Chest pain 12/27/2017   Chronic diastolic CHF (congestive heart failure) (HCC)    COPD (chronic obstructive pulmonary disease) ([place])    Depression    DM (diabetes mellitus) ([place])    DVT (deep vein thrombosis) in pregnancy    Dysrhythmia    atrial fibrilation   GERD (gastroesophageal reflux disease)    Headache(784.0)    HTN (hypertension)    Hyperlipidemia    Obesity    Pneumonia 04/2018   RIGHT LOBE   Shortness of breath    Sleep apnea    compliant with CPAP   Current Outpatient Medications  Medication Sig Dispense Refill   Accu-Chek Softclix Lancets lancets Use as instructed to check blood sugar three times daily. E11.69 100 each 12   albuterol (VENTOLIN HFA) 108 (90 Base) MCG/ACT inhaler Inhale 2 puffs into the lungs every 4 (four) hours as needed (Asthma).     Alcohol Swabs (B-D SINGLE USE SWABS REGULAR) PADS 1 each by Other route daily.     ALPRAZolam (XANAX) 0.5 MG tablet Take 1 tablet (0.5 mg total) by mouth 2 (two) times daily as needed for anxiety. 30 tablet 0   amiodarone (PACERONE) 200 MG tablet Take 200 mg by mouth daily.     apixaban (ELIQUIS) 5 MG TABS tablet Take 1 tablet (5 mg total) by mouth 2 (two) times daily. 180 tablet 1   B-D ULTRAFINE III SHORT PEN 31G X 8 MM MISC 1 each by Other route in the morning, at noon, in the evening, and at bedtime.     blood glucose meter kit and supplies KIT Dispense based on patient and insurance preference. Use up to four times daily as directed. 1 each 0   Blood Glucose Monitoring Suppl (ACCU-CHEK GUIDE ME) w/Device KIT Use as instructed to check blood sugar three times daily. E11.69 1 kit 0   Blood Glucose Monitoring Suppl (ONETOUCH  VERIO)  w/Device KIT Use to check blood sugar TID. E11.69 1 kit 0   colchicine 0.6 MG tablet Taking 1/2 tablet by mouth daily     docusate sodium (COLACE) 250 MG capsule Take 100 mg by mouth daily.     Dulaglutide (TRULICITY) 1.5 WC/3.7SE SOPN Inject 1.5 mg into the skin once a week. 0.5 mL 3   DULoxetine (CYMBALTA) 60 MG capsule Take 1 capsule (60 mg total) by mouth daily. 90 capsule 3   EPINEPHrine 0.3 mg/0.3 mL IJ SOAJ injection Inject 0.3 mg into the muscle as needed for anaphylaxis. 1 each 1   glucose blood (ONETOUCH VERIO) test strip Use to check blood sugar TID. E11.69 100 each 6   insulin aspart (NOVOLOG) 100 UNIT/ML injection Inject 12 Units into the skin 3 (three) times daily before meals. For blood sugars 0-199 give 0 units of insulin, 201-250 give 4 units, 251-300 give 6 units, 301-350 give 8 units, 351-400 give 10 units,> 400 give 12 units and call M.D. (Patient taking differently: Inject 5-10 Units into the skin 3 (three) times daily before meals. For blood sugars 0-199 give 0 units of insulin, 201-250 give 4 units, 251-300 give 6 units, 301-350 give 8 units, 351-400 give 10 units,> 400 give 12 units and call M.D.) 10 mL 3   insulin detemir (LEVEMIR) 100 UNIT/ML injection INJECT 0.4 MLS (40 UNITS TOTAL) INTO THE SKIN 2 (TWO) TIMES DAILY. (Patient taking differently: Inject 50 Units into the skin 2 (two) times daily.) 60 mL 1   metoprolol succinate (TOPROL XL) 25 MG 24 hr tablet Take 1.5 tablets (37.5 mg total) by mouth daily. 30 tablet 11   Multiple Vitamins-Minerals (MULTIVITAMIN WOMEN PO) Take 1 tablet by mouth daily.     nitroGLYCERIN (NITROSTAT) 0.4 MG SL tablet Place 1 tablet (0.4 mg total) under the tongue every 5 (five) minutes x 3 doses as needed for chest pain. 25 tablet 12   ondansetron (ZOFRAN ODT) 4 MG disintegrating tablet Take 1 tablet (4 mg total) by mouth every 8 (eight) hours as needed for nausea or vomiting. 20 tablet 0   OneTouch Delica Lancets 83T MISC Use to check blood  sugar TID.  E11.69 100 each 6   pantoprazole (PROTONIX) 40 MG tablet Take 40 mg by mouth 2 (two) times daily.     potassium chloride SA (KLOR-CON) 20 MEQ tablet Take 2 tablets (40 mEq total) by mouth daily. Take an additional 40 meq with every dose of METOLAZONE 66 tablet 6   RESTASIS 0.05 % ophthalmic emulsion Place 1 drop into both eyes 2 (two) times daily.  12   rosuvastatin (CRESTOR) 10 MG tablet Take 1 tablet (10 mg total) by mouth daily. 90 tablet 1   torsemide (DEMADEX) 20 MG tablet Take 2 tablets (40 mg total) by mouth daily. Can take an additional 2 tablets as needed for increased weight gain 80 tablet 0   No current facility-administered medications for this encounter.   Vitals:   05/16/21 1147  BP: 138/72  Pulse: 82  SpO2: 99%  Weight: 133.1 kg (293 lb 6.4 oz)    PHYSICAL EXAM: ReDs Clip 46%  General:  obese, No respiratory difficulty at rest  HEENT: normal Neck: supple. JVD to jaw. Carotids 2+ bilat; no bruits. No lymphadenopathy or thyromegaly appreciated. Cor: PMI nondisplaced. Regular rate & rhythm. No rubs, gallops or murmurs. Lungs: decreased BS at the bases bilaterally  Abdomen: obese, soft, nontender, nondistended. No hepatosplenomegaly. No bruits or masses. Good bowel sounds. Extremities: no  cyanosis, clubbing, rash, obese LEs,  2+ bilateral LE edema Neuro: alert & oriented x 3, cranial nerves grossly intact. moves all 4 extremities w/o difficulty. Affect pleasant.   ECG: NSR 79 bpm, incomplete RBBB. QT 460 ms   ASSESSMENT & PLAN:  1. Acute on Chronic Systolic Heart Failure: - RHC (5/22): elevated R>L filling pressures with PAPi low, suggestive of significant component of RV dysfunction, prominent v-waves in PCWP and RA tracings suggestive of MR/TR, CO low - RHC (6/22) with low filling pressures with moderately reduced CO. - Echo (7/22) in setting of AFL with RVR, showed EF 30-35%, moderate LVH, moderate RV dysfunction, PASP 48, s/p MV repair with mild MR and  mean gradient 10 mmHg with HR around 130, s/p TV repair with mild-moderate TR, dilated IVC.  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  - NYHA IIIb, confounded by obesity and deconditioning - Volume status elevated, Wt up 15 lb from recent hospital d/c wt. ReDs clip elevated at 46% - Continue torsemide 80 mg daily  - Add scheduled metolazone, 2.5 mg qMondays + Fridays + extra 40 meQ of KCl on metolazone days  - can take an extra 2.5 mg of Metolazone this coming Wednesday if needed - No spiro, arni with CKD  - Eventually would like to start SGLT2i when GFR > 20.  - BMET today and again in 7 days - discussed low sodium diet and fluid restriction    2. PAF - She has failed Tikosyn and amiodarone. - She had Maze in 5/22 and DCCV in 6/22 & 04/19/21, last of which she had ERAF. - Per EP, likely not candidate for PPM or AV nodal ablation. - in NSR on EKG today  - Continue Toprol 35.7 mg daily. - Continue amiodarone 200 mg  - Continue Eliquis 5 mg bid.  - Has OSA, awaiting split night study for CPAP titration   3. CKD Stage IIIb.  - Baseline creatinine 2.1-2.4  - AKI recent admit, SCr 3.4>>2.9 day of d/c  - Repeat BMP today and again in 7 days w/ diuretic increase - Consider SGLT2i if GFR> 20  - suspect she may be nearing need for HD. Will refer to nephrology   4. CAD - Cath (2019): Prox LAD lesion is 10% stenosed, Dist LM to Ost LAD lesion is 10% stenosed, Ost Cx lesion is 15% stenosed, - Recent admission with CP, likely non-cardiac - Denies CP today  - Continue statin + beta blocker - no ASA w/ Eliquis    5. Valvular Heart Disease: - S/p MV and TV repair in 5/22.  - Echo 02/25/21 with mild-moderate TR.  There is only mild MR, but mean gradient across the valve is 10 mmHg suggesting mitral stenosis   6. Obesity - Body mass index is 45.95 kg/m.   7. DMII - 01/2021 Last Hgb A1C 7.4  - Consider SGLT2i if GFR >20   8. Low Literacy Level - refer to paramedicine to help w/ med management     F/u w/ APP in 2-3 weeks.   Lyda Jester, PA-C  Patient seen and examined with the above-signed Advanced Practice Provider and/or Housestaff. I personally reviewed laboratory data, imaging studies and relevant notes. I independently examined the patient and formulated the important aspects of the plan. I have edited the note to reflect any of my changes or salient points. I have personally discussed the plan with the patient and/or family.  Recently admitted for volume depletion and AKI. Now presents with 15 pounds volume overload  and progressive HF symptoms despite compliance with current meds. Remains in NSR  General:  Obese woman No resp difficulty HEENT: normal Neck: supple. JVP to jaw  Carotids 2+ bilat; no bruits. No lymphadenopathy or thryomegaly appreciated. Cor: PMI nondisplaced. Regular rate & rhythm. No rubs, gallops or murmurs. Lungs: clear Abdomen: obese soft, nontender, nondistended. No hepatosplenomegaly. No bruits or masses. Good bowel sounds. Extremities: no cyanosis, clubbing, rash, 2+ edema Neuro: alert & orientedx3, cranial nerves grossly intact. moves all 4 extremities w/o difficulty. Affect pleasant  Difficult situation. Volume status has been very hard to manage in setting of CKD IV and cardiorenal syndrome. Long talk about fact that she may be nearing time for HD.    Will continue torsemide 80 daily. Add metolazone 2.5 and kcl 40 Monday and Wednesday. Heve engaged Paramedicine to help. Check labs. Refer to Nephrology ASAP.   Total time spent 35 minutes. Over half that time spent discussing above.   Glori Bickers, MD  3:02 PM

## 2021-05-16 ENCOUNTER — Ambulatory Visit (HOSPITAL_COMMUNITY): Payer: Medicare Other

## 2021-05-16 ENCOUNTER — Other Ambulatory Visit: Payer: Self-pay

## 2021-05-16 ENCOUNTER — Encounter (HOSPITAL_COMMUNITY): Payer: Self-pay

## 2021-05-16 ENCOUNTER — Ambulatory Visit (HOSPITAL_COMMUNITY)
Admission: RE | Admit: 2021-05-16 | Discharge: 2021-05-16 | Disposition: A | Discharge status: Home / Self Care | Payer: Medicare Other | Source: Ambulatory Visit | Attending: Family Medicine | Admitting: Family Medicine

## 2021-05-16 ENCOUNTER — Telehealth (HOSPITAL_COMMUNITY): Payer: Self-pay | Admitting: Surgery

## 2021-05-16 ENCOUNTER — Other Ambulatory Visit (HOSPITAL_COMMUNITY): Payer: Self-pay

## 2021-05-16 VITALS — BP 138/72 | HR 82 | Wt 293.4 lb

## 2021-05-16 DIAGNOSIS — N184 Chronic kidney disease, stage 4 (severe): Secondary | ICD-10-CM | POA: Diagnosis not present

## 2021-05-16 DIAGNOSIS — E1122 Type 2 diabetes mellitus with diabetic chronic kidney disease: Secondary | ICD-10-CM | POA: Diagnosis not present

## 2021-05-16 DIAGNOSIS — G4733 Obstructive sleep apnea (adult) (pediatric): Secondary | ICD-10-CM | POA: Insufficient documentation

## 2021-05-16 DIAGNOSIS — I480 Paroxysmal atrial fibrillation: Secondary | ICD-10-CM | POA: Diagnosis not present

## 2021-05-16 DIAGNOSIS — Z79899 Other long term (current) drug therapy: Secondary | ICD-10-CM | POA: Diagnosis not present

## 2021-05-16 DIAGNOSIS — I5023 Acute on chronic systolic (congestive) heart failure: Secondary | ICD-10-CM | POA: Diagnosis not present

## 2021-05-16 DIAGNOSIS — Z794 Long term (current) use of insulin: Secondary | ICD-10-CM | POA: Diagnosis not present

## 2021-05-16 DIAGNOSIS — Z6841 Body Mass Index (BMI) 40.0 and over, adult: Secondary | ICD-10-CM | POA: Diagnosis not present

## 2021-05-16 DIAGNOSIS — I130 Hypertensive heart and chronic kidney disease with heart failure and stage 1 through stage 4 chronic kidney disease, or unspecified chronic kidney disease: Secondary | ICD-10-CM | POA: Insufficient documentation

## 2021-05-16 DIAGNOSIS — I5042 Chronic combined systolic (congestive) and diastolic (congestive) heart failure: Secondary | ICD-10-CM | POA: Diagnosis not present

## 2021-05-16 DIAGNOSIS — I5043 Acute on chronic combined systolic (congestive) and diastolic (congestive) heart failure: Secondary | ICD-10-CM | POA: Insufficient documentation

## 2021-05-16 DIAGNOSIS — I2510 Atherosclerotic heart disease of native coronary artery without angina pectoris: Secondary | ICD-10-CM | POA: Diagnosis not present

## 2021-05-16 DIAGNOSIS — I081 Rheumatic disorders of both mitral and tricuspid valves: Secondary | ICD-10-CM | POA: Diagnosis not present

## 2021-05-16 DIAGNOSIS — Z7901 Long term (current) use of anticoagulants: Secondary | ICD-10-CM | POA: Insufficient documentation

## 2021-05-16 DIAGNOSIS — E1169 Type 2 diabetes mellitus with other specified complication: Secondary | ICD-10-CM

## 2021-05-16 LAB — CBC
HCT: 31.7 % — ABNORMAL LOW (ref 36.0–46.0)
Hemoglobin: 9.8 g/dL — ABNORMAL LOW (ref 12.0–15.0)
MCH: 28 pg (ref 26.0–34.0)
MCHC: 30.9 g/dL (ref 30.0–36.0)
MCV: 90.6 fL (ref 80.0–100.0)
Platelets: 210 10*3/uL (ref 150–400)
RBC: 3.5 MIL/uL — ABNORMAL LOW (ref 3.87–5.11)
RDW: 16.4 % — ABNORMAL HIGH (ref 11.5–15.5)
WBC: 7 10*3/uL (ref 4.0–10.5)
nRBC: 0 % (ref 0.0–0.2)

## 2021-05-16 LAB — BRAIN NATRIURETIC PEPTIDE: B Natriuretic Peptide: 188.7 pg/mL — ABNORMAL HIGH (ref 0.0–100.0)

## 2021-05-16 MED ORDER — POTASSIUM CHLORIDE CRYS ER 20 MEQ PO TBCR: 40.0000 meq | EXTENDED_RELEASE_TABLET | Freq: Every day | ORAL | 6 refills | Status: DC

## 2021-05-16 MED ORDER — ROSUVASTATIN CALCIUM 10 MG PO TABS: 10.0000 mg | ORAL_TABLET | Freq: Every day | ORAL | 3 refills | Status: DC

## 2021-05-16 MED ORDER — METOLAZONE 2.5 MG PO TABS: 2.5000 mg | ORAL_TABLET | ORAL | 3 refills | Status: DC

## 2021-05-16 MED ORDER — APIXABAN 5 MG PO TABS: 5.0000 mg | ORAL_TABLET | Freq: Two times a day (BID) | ORAL | 3 refills | Status: DC

## 2021-05-16 MED ORDER — TORSEMIDE 20 MG PO TABS: 80.0000 mg | ORAL_TABLET | Freq: Every day | ORAL | 5 refills | Status: DC

## 2021-05-16 NOTE — Telephone Encounter (Signed)
-----   Message from Consuelo Pandy, [place] sent at 05/16/2021  3:54 PM EDT ----- Unfortunately BNP was checked instead of BMP. Please have pt return on Thursday for BMET. Also will need f/u CBC given drop in Hgb. Please call to arrange labs.

## 2021-05-16 NOTE — Telephone Encounter (Signed)
I called patient to schedule repeat lab work for Thursday 05/18/21 per Allena Katz NP and to inform her that I sent refill prescriptions to her pharmacy of choice.  She is aware and agreeable.

## 2021-05-16 NOTE — Patient Instructions (Signed)
Labs were done today, if any labs come back abnormal the clinic will call you  Your physician recommends that you schedule a follow-up appointment in: 3 weeks  Your physician recommends that you return for lab work in: 7 days  You are being referred to [place] they will call you to make a appointment  TAKE Metolazone 2.5 mg 1 tablet every Monday and Friday  TAKE a extra Potassium 40 meq with Metolazone dose Every Monday and Friday  Per Lyda Jester PA take a extra 2.5 Metolazone 1 tablet this Wednesday if needed  INCREASE torsemide to 80 mg daily  At the [facility], you and your health needs are our priority. As part of our continuing mission to provide you with exceptional heart care, we have created designated Provider Care Teams. These Care Teams include your primary Cardiologist (physician) and Advanced Practice Providers (APPs- Physician Assistants and Nurse Practitioners) who all work together to provide you with the care you need, when you need it.   You may see any of the following providers on your designated Care Team at your next follow up: Dr Glori Bickers Dr Loralie Champagne Dr Patrice Paradise, NP Lyda Jester, [place] Ginnie Smart Audry Riles, PharmD   Please be sure to bring in all your medications bottles to every appointment.   If you have any questions or concerns before your next appointment please send [place] a message through [place] or call our office at [phone].    TO LEAVE A MESSAGE FOR THE NURSE SELECT OPTION 2, PLEASE LEAVE A MESSAGE INCLUDING: YOUR NAME DATE OF BIRTH CALL BACK NUMBER REASON FOR CALL**this is important as we prioritize the call backs  YOU WILL RECEIVE A CALL BACK THE SAME DAY AS LONG AS YOU CALL BEFORE 4:00 PM

## 2021-05-16 NOTE — Telephone Encounter (Signed)
-----   Message from Jeris Penta, EMT sent at 05/16/2021  3:52 PM EDT ----- Regarding: med refill Hoyle Sauer needs Eliquis and Crestor sent to [facility] on [address] please. Thanks!

## 2021-05-16 NOTE — Progress Notes (Signed)
ReDS Vest / Clip - 05/16/21 1200       ReDS Vest / Clip   Station Marker C    Ruler Value 29    ReDS Value Range High volume overload    ReDS Actual Value 46

## 2021-05-16 NOTE — Progress Notes (Signed)
Paramedicine Encounter    Patient ID: [name], female    DOB: Dec 01, 1955, 65 y.o.   MRN: [id number]   Met with [name] in clinic today to establish paramedicine. [name] agreeable to Monday visits. Today PA and MD evaluated [name] and made the following med changes:  -Add Metolazone 2.54m on Monday's and Friday's with extra 40MEQ of Potassium   -Follow up with [facility]  -I will follow up on Wednesday for a check in.   -CTationahas a sleep study scheduled for Wednesday   -[facility] -Eliquis -Torsemide -Duloxetine -Colchicine -Crestor   WEIGHT TODAY- 293 REDS CLIP- 46%    ACTION: Home visit completed

## 2021-05-17 ENCOUNTER — Telehealth (HOSPITAL_COMMUNITY): Payer: Self-pay

## 2021-05-17 NOTE — Telephone Encounter (Signed)
Received a call from [place] reporting she has been vomiting with diarrhea since 0500 this morning. She states she ate [employer] last night and began throwing up at 0500. She is going to try OTC Immodium and Dramamine for the N/V/D. I will follow up later today via phone to check in.

## 2021-05-17 NOTE — Telephone Encounter (Signed)
Spoke to [name] who reports she is no longer vomiting but is still having diarrhea. Her weight this morning is 286lbs. I will report this to HF clinic staff. She continues to report some shortness of breath and leg swelling. I will continue to follow up. Call complete.

## 2021-05-18 ENCOUNTER — Ambulatory Visit (HOSPITAL_COMMUNITY): Payer: Medicare Other

## 2021-05-18 ENCOUNTER — Ambulatory Visit (HOSPITAL_BASED_OUTPATIENT_CLINIC_OR_DEPARTMENT_OTHER): Payer: Medicare Other | Admitting: Internal Medicine

## 2021-05-19 ENCOUNTER — Telehealth (INDEPENDENT_AMBULATORY_CARE_PROVIDER_SITE_OTHER): Payer: Self-pay

## 2021-05-19 ENCOUNTER — Other Ambulatory Visit: Payer: Self-pay

## 2021-05-19 ENCOUNTER — Ambulatory Visit (HOSPITAL_COMMUNITY)
Admission: RE | Admit: 2021-05-19 | Discharge: 2021-05-19 | Disposition: A | Discharge status: Home / Self Care | Payer: Medicare Other | Source: Ambulatory Visit | Attending: Internal Medicine | Admitting: Internal Medicine

## 2021-05-19 DIAGNOSIS — I5042 Chronic combined systolic (congestive) and diastolic (congestive) heart failure: Secondary | ICD-10-CM | POA: Insufficient documentation

## 2021-05-19 LAB — BASIC METABOLIC PANEL
Anion gap: 10 (ref 5–15)
BUN: 34 mg/dL — ABNORMAL HIGH (ref 8–23)
CO2: 32 mmol/L (ref 22–32)
Calcium: 9.3 mg/dL (ref 8.9–10.3)
Chloride: 100 mmol/L (ref 98–111)
Creatinine, Ser: 2.62 mg/dL — ABNORMAL HIGH (ref 0.44–1.00)
GFR, Estimated: 20 mL/min — ABNORMAL LOW (ref >60)
Glucose, Bld: 75 mg/dL (ref 70–99)
Potassium: 4 mmol/L (ref 3.5–5.1)
Sodium: 142 mmol/L (ref 135–145)

## 2021-05-19 LAB — CBC
HCT: 32.8 % — ABNORMAL LOW (ref 36.0–46.0)
Hemoglobin: 10.2 g/dL — ABNORMAL LOW (ref 12.0–15.0)
MCH: 28.2 pg (ref 26.0–34.0)
MCHC: 31.1 g/dL (ref 30.0–36.0)
MCV: 90.6 fL (ref 80.0–100.0)
Platelets: 196 10*3/uL (ref 150–400)
RBC: 3.62 MIL/uL — ABNORMAL LOW (ref 3.87–5.11)
RDW: 16.2 % — ABNORMAL HIGH (ref 11.5–15.5)
WBC: 6.6 10*3/uL (ref 4.0–10.5)
nRBC: 0 % (ref 0.0–0.2)

## 2021-05-19 LAB — BRAIN NATRIURETIC PEPTIDE: B Natriuretic Peptide: 285.3 pg/mL — ABNORMAL HIGH (ref 0.0–100.0)

## 2021-05-19 NOTE — Telephone Encounter (Signed)
Copied from [place] [id number]. Topic: General - Other >> May 19, 2021  8:47 AM Celene Kras wrote: Reason for CRM: Jacolyn Reedy, calling from the sleep center [facility], and is requesting to have the orders for pts CPAP study are supposed to be a split night study. He is requesting to have this changed. Please advise.     [phone]

## 2021-05-20 ENCOUNTER — Ambulatory Visit (HOSPITAL_COMMUNITY): Payer: Medicare Other

## 2021-05-20 ENCOUNTER — Other Ambulatory Visit (INDEPENDENT_AMBULATORY_CARE_PROVIDER_SITE_OTHER): Payer: Self-pay | Admitting: Primary Care

## 2021-05-21 DIAGNOSIS — G4733 Obstructive sleep apnea (adult) (pediatric): Secondary | ICD-10-CM | POA: Diagnosis not present

## 2021-05-23 ENCOUNTER — Ambulatory Visit (HOSPITAL_COMMUNITY)
Admission: RE | Admit: 2021-05-23 | Discharge: 2021-05-23 | Disposition: A | Discharge status: Home / Self Care | Payer: Medicare HMO | Source: Ambulatory Visit | Attending: Internal Medicine | Admitting: Internal Medicine

## 2021-05-23 ENCOUNTER — Ambulatory Visit (HOSPITAL_COMMUNITY): Payer: Medicare Other

## 2021-05-23 ENCOUNTER — Other Ambulatory Visit: Payer: Self-pay

## 2021-05-23 ENCOUNTER — Other Ambulatory Visit (HOSPITAL_COMMUNITY): Payer: Self-pay | Admitting: Surgery

## 2021-05-23 ENCOUNTER — Telehealth (HOSPITAL_COMMUNITY): Payer: Self-pay | Admitting: Surgery

## 2021-05-23 DIAGNOSIS — I5042 Chronic combined systolic (congestive) and diastolic (congestive) heart failure: Secondary | ICD-10-CM | POA: Diagnosis not present

## 2021-05-23 DIAGNOSIS — I5023 Acute on chronic systolic (congestive) heart failure: Secondary | ICD-10-CM

## 2021-05-23 LAB — BASIC METABOLIC PANEL
Anion gap: 10 (ref 5–15)
BUN: 47 mg/dL — ABNORMAL HIGH (ref 8–23)
CO2: 34 mmol/L — ABNORMAL HIGH (ref 22–32)
Calcium: 9.6 mg/dL (ref 8.9–10.3)
Chloride: 97 mmol/L — ABNORMAL LOW (ref 98–111)
Creatinine, Ser: 2.82 mg/dL — ABNORMAL HIGH (ref 0.44–1.00)
GFR, Estimated: 18 mL/min — ABNORMAL LOW (ref >60)
Glucose, Bld: 124 mg/dL — ABNORMAL HIGH (ref 70–99)
Potassium: 3.4 mmol/L — ABNORMAL LOW (ref 3.5–5.1)
Sodium: 141 mmol/L (ref 135–145)

## 2021-05-23 MED ORDER — POTASSIUM CHLORIDE CRYS ER 20 MEQ PO TBCR: 60.0000 meq | EXTENDED_RELEASE_TABLET | Freq: Every day | ORAL | 6 refills | Status: DC

## 2021-05-23 NOTE — Progress Notes (Signed)
Order for potassium updated to new dosage.

## 2021-05-23 NOTE — Telephone Encounter (Signed)
-----   Message from Rafael Bihari, [place] sent at 05/23/2021  4:20 PM EDT ----- K is low. Is she taking her K supp, along with extra 40 KCL on metolazone days? If so, please increase daily KCl suppl by 20 mEq. Repeat BMET in 10 days.

## 2021-05-23 NOTE — Telephone Encounter (Signed)
I called patient to inform her of results and instructions per Allena Katz NP.  She is aware and agreeable. I sent prescription to her pharmacy of choice.  She has repeat labwork scheduled for October 13th.

## 2021-05-24 ENCOUNTER — Ambulatory Visit (INDEPENDENT_AMBULATORY_CARE_PROVIDER_SITE_OTHER): Payer: Medicare HMO | Admitting: Primary Care

## 2021-05-24 ENCOUNTER — Encounter (INDEPENDENT_AMBULATORY_CARE_PROVIDER_SITE_OTHER): Payer: Self-pay | Admitting: Primary Care

## 2021-05-24 VITALS — BP 130/83 | HR 76 | Temp 97.5°F | Ht 67.0 in | Wt 277.2 lb

## 2021-05-24 DIAGNOSIS — N183 Chronic kidney disease, stage 3 unspecified: Secondary | ICD-10-CM

## 2021-05-24 DIAGNOSIS — Z794 Long term (current) use of insulin: Secondary | ICD-10-CM | POA: Diagnosis not present

## 2021-05-24 DIAGNOSIS — K3184 Gastroparesis: Secondary | ICD-10-CM

## 2021-05-24 DIAGNOSIS — E1122 Type 2 diabetes mellitus with diabetic chronic kidney disease: Secondary | ICD-10-CM

## 2021-05-24 DIAGNOSIS — J0110 Acute frontal sinusitis, unspecified: Secondary | ICD-10-CM | POA: Diagnosis not present

## 2021-05-24 DIAGNOSIS — K219 Gastro-esophageal reflux disease without esophagitis: Secondary | ICD-10-CM

## 2021-05-24 DIAGNOSIS — G4733 Obstructive sleep apnea (adult) (pediatric): Secondary | ICD-10-CM

## 2021-05-24 DIAGNOSIS — R0902 Hypoxemia: Secondary | ICD-10-CM

## 2021-05-24 DIAGNOSIS — Z09 Encounter for follow-up examination after completed treatment for conditions other than malignant neoplasm: Secondary | ICD-10-CM

## 2021-05-24 DIAGNOSIS — K5900 Constipation, unspecified: Secondary | ICD-10-CM | POA: Diagnosis not present

## 2021-05-24 MED ORDER — LORATADINE 10 MG PO TABS: 10.0000 mg | ORAL_TABLET | Freq: Every day | ORAL | 11 refills | Status: DC

## 2021-05-24 MED ORDER — FLUTICASONE PROPIONATE 50 MCG/ACT NA SUSP: 2.0000 | Freq: Every day | NASAL | 6 refills | Status: DC

## 2021-05-24 MED ORDER — AMOXICILLIN-POT CLAVULANATE 875-125 MG PO TABS: 1.0000 | ORAL_TABLET | Freq: Two times a day (BID) | ORAL | 0 refills | Status: DC

## 2021-05-24 MED ORDER — LINACLOTIDE 72 MCG PO CAPS: 72.0000 ug | ORAL_CAPSULE | Freq: Every day | ORAL | 1 refills | Status: DC

## 2021-05-24 NOTE — Patient Instructions (Signed)
Sinusitis, Adult °Sinusitis is inflammation of your sinuses. Sinuses are hollow spaces in the bones around your face. Your sinuses are located: °Around your eyes. °In the middle of your forehead. °Behind your nose. °In your cheekbones. °Mucus normally drains out of your sinuses. When your nasal tissues become inflamed or swollen, mucus can become trapped or blocked. This allows bacteria, viruses, and fungi to grow, which leads to infection. Most infections of the sinuses are caused by a virus. °Sinusitis can develop quickly. It can last for up to 4 weeks (acute) or for more than 12 weeks (chronic). Sinusitis often develops after a cold. °What are the causes? °This condition is caused by anything that creates swelling in the sinuses or stops mucus from draining. This includes: °Allergies. °Asthma. °Infection from bacteria or viruses. °Deformities or blockages in your nose or sinuses. °Abnormal growths in the nose (nasal polyps). °Pollutants, such as chemicals or irritants in the air. °Infection from fungi (rare). °What increases the risk? °You are more likely to develop this condition if you: °Have a weak body defense system (immune system). °Do a lot of swimming or diving. °Overuse nasal sprays. °Smoke. °What are the signs or symptoms? °The main symptoms of this condition are pain and a feeling of pressure around the affected sinuses. Other symptoms include: °Stuffy nose or congestion. °Thick drainage from your nose. °Swelling and warmth over the affected sinuses. °Headache. °Upper toothache. °A cough that may get worse at night. °Extra mucus that collects in the throat or the back of the nose (postnasal drip). °Decreased sense of smell and taste. °Fatigue. °A fever. °Sore throat. °Bad breath. °How is this diagnosed? °This condition is diagnosed based on: °Your symptoms. °Your medical history. °A physical exam. °Tests to find out if your condition is acute or chronic. This may include: °Checking your nose for nasal  polyps. °Viewing your sinuses using a device that has a light (endoscope). °Testing for allergies or bacteria. °Imaging tests, such as an MRI or CT scan. °In rare cases, a bone biopsy may be done to rule out more serious types of fungal sinus disease. °How is this treated? °Treatment for sinusitis depends on the cause and whether your condition is chronic or acute. °If caused by a virus, your symptoms should go away on their own within 10 days. You may be given medicines to relieve symptoms. They include: °Medicines that shrink swollen nasal passages (topical intranasal decongestants). °Medicines that treat allergies (antihistamines). °A spray that eases inflammation of the nostrils (topical intranasal corticosteroids). °Rinses that help get rid of thick mucus in your nose (nasal saline washes). °If caused by bacteria, your health care provider may recommend waiting to see if your symptoms improve. Most bacterial infections will get better without antibiotic medicine. You may be given antibiotics if you have: °A severe infection. °A weak immune system. °If caused by narrow nasal passages or nasal polyps, you may need to have surgery. °Follow these instructions at home: °Medicines °Take, use, or apply over-the-counter and prescription medicines only as told by your health care provider. These may include nasal sprays. °If you were prescribed an antibiotic medicine, take it as told by your health care provider. Do not stop taking the antibiotic even if you start to feel better. °Hydrate and humidify ° °Drink enough fluid to keep your urine pale yellow. Staying hydrated will help to thin your mucus. °Use a cool mist humidifier to keep the humidity level in your home above 50%. °Inhale steam for 10-15 minutes, 3-4 times   a day, or as told by your health care provider. You can do this in the bathroom while a hot shower is running. °Limit your exposure to cool or dry air. °Rest °Rest as much as possible. °Sleep with your  head raised (elevated). °Make sure you get enough sleep each night. °General instructions ° °Apply a warm, moist washcloth to your face 3-4 times a day or as told by your health care provider. This will help with discomfort. °Wash your hands often with soap and water to reduce your exposure to germs. If soap and water are not available, use hand sanitizer. °Do not smoke. Avoid being around people who are smoking (secondhand smoke). °Keep all follow-up visits as told by your health care provider. This is important. °Contact a health care provider if: °You have a fever. °Your symptoms get worse. °Your symptoms do not improve within 10 days. °Get help right away if: °You have a severe headache. °You have persistent vomiting. °You have severe pain or swelling around your face or eyes. °You have vision problems. °You develop confusion. °Your neck is stiff. °You have trouble breathing. °Summary °Sinusitis is soreness and inflammation of your sinuses. Sinuses are hollow spaces in the bones around your face. °This condition is caused by nasal tissues that become inflamed or swollen. The swelling traps or blocks the flow of mucus. This allows bacteria, viruses, and fungi to grow, which leads to infection. °If you were prescribed an antibiotic medicine, take it as told by your health care provider. Do not stop taking the antibiotic even if you start to feel better. °Keep all follow-up visits as told by your health care provider. This is important. °This information is not intended to replace advice given to you by your health care provider. Make sure you discuss any questions you have with your health care provider. °Document Revised: 01/07/2018 Document Reviewed: 01/07/2018 °Elsevier Patient Education © 2022 [employer] ° °

## 2021-05-24 NOTE — Progress Notes (Signed)
[facility]   Subjective:   [name] is a 65 y.o. female presents for hospital follow up . Patient initially presented to [facility] for an appointment told PCP she was not feeling well. Patient was in the car nauseated and sweating with her extensive hx of heart problems PCP called 911 and Dr. [name] inform of event she was admitted to the hospital on 05/02/21, patient was discharged from the hospital on 05/06/21, patient was admitted for: Atrial flutter ([place]) and Acute kidney injury ([place]). Today she denies shortness of breath, chest pain or lower extremity edema. She does have a headache frontal and nasal area. 7/10 taking OTC tylenol 325(2) no helping.  Past Medical History:  Diagnosis Date   Allergic rhinitis    Arthritis    Asthma    Chest pain 12/27/2017   Chronic diastolic CHF (congestive heart failure) (HCC)    COPD (chronic obstructive pulmonary disease) (HCC)    Depression    DM (diabetes mellitus) ([place])    DVT (deep vein thrombosis) in pregnancy    Dysrhythmia    atrial fibrilation   GERD (gastroesophageal reflux disease)    Headache(784.0)    HTN (hypertension)    Hyperlipidemia    Obesity    Pneumonia 04/2018   RIGHT LOBE   Shortness of breath    Sleep apnea    compliant with CPAP     Allergies  Allergen Reactions   Sulfa Antibiotics Itching      Current Outpatient Medications on File Prior to Visit  Medication Sig Dispense Refill   Accu-Chek Softclix Lancets lancets Use as instructed to check blood sugar three times daily. E11.69 100 each 12   albuterol (VENTOLIN HFA) 108 (90 Base) MCG/ACT inhaler Inhale 2 puffs into the lungs every 4 (four) hours as needed (Asthma).     Alcohol Swabs (B-D SINGLE USE SWABS REGULAR) PADS 1 each by Other route daily.     ALPRAZolam (XANAX) 0.5 MG tablet Take 1 tablet (0.5 mg total) by mouth 2 (two) times daily as needed for anxiety. 30 tablet 0   amiodarone (PACERONE) 200 MG tablet Take 200 mg by  mouth daily.     apixaban (ELIQUIS) 5 MG TABS tablet Take 1 tablet (5 mg total) by mouth 2 (two) times daily. 180 tablet 3   B-D ULTRAFINE III SHORT PEN 31G X 8 MM MISC 1 each by Other route in the morning, at noon, in the evening, and at bedtime.     blood glucose meter kit and supplies KIT Dispense based on patient and insurance preference. Use up to four times daily as directed. 1 each 0   Blood Glucose Monitoring Suppl (ACCU-CHEK GUIDE ME) w/Device KIT Use as instructed to check blood sugar three times daily. E11.69 1 kit 0   Blood Glucose Monitoring Suppl (ONETOUCH VERIO) w/Device KIT Use to check blood sugar TID. E11.69 1 kit 0   colchicine 0.6 MG tablet Taking 1/2 tablet by mouth daily     docusate sodium (COLACE) 250 MG capsule Take 100 mg by mouth daily.     Dulaglutide (TRULICITY) 1.5 OF/7.5ZW SOPN Inject 1.5 mg into the skin once a week. 0.5 mL 3   DULoxetine (CYMBALTA) 60 MG capsule Take 1 capsule (60 mg total) by mouth daily. 90 capsule 3   EPINEPHrine 0.3 mg/0.3 mL IJ SOAJ injection Inject 0.3 mg into the muscle as needed for anaphylaxis. 1 each 1   glucose blood (ONETOUCH VERIO) test strip Use to check blood  sugar TID. E11.69 100 each 6   insulin aspart (NOVOLOG) 100 UNIT/ML injection Inject 12 Units into the skin 3 (three) times daily before meals. For blood sugars 0-199 give 0 units of insulin, 201-250 give 4 units, 251-300 give 6 units, 301-350 give 8 units, 351-400 give 10 units,> 400 give 12 units and call M.D. (Patient taking differently: Inject 5-10 Units into the skin 3 (three) times daily before meals. For blood sugars 0-199 give 0 units of insulin, 201-250 give 4 units, 251-300 give 6 units, 301-350 give 8 units, 351-400 give 10 units,> 400 give 12 units and call M.D.) 10 mL 3   insulin detemir (LEVEMIR) 100 UNIT/ML injection INJECT 0.4 MLS (40 UNITS TOTAL) INTO THE SKIN 2 (TWO) TIMES DAILY. (Patient taking differently: Inject 50 Units into the skin 2 (two) times daily.) 60 mL 1    metolazone (ZAROXOLYN) 2.5 MG tablet Take 1 tablet (2.5 mg total) by mouth 2 (two) times a week. Take every Monday and Friday 90 tablet 3   metoprolol succinate (TOPROL XL) 25 MG 24 hr tablet Take 1.5 tablets (37.5 mg total) by mouth daily. 30 tablet 11   Multiple Vitamins-Minerals (MULTIVITAMIN WOMEN PO) Take 1 tablet by mouth daily.     nitroGLYCERIN (NITROSTAT) 0.4 MG SL tablet Place 1 tablet (0.4 mg total) under the tongue every 5 (five) minutes x 3 doses as needed for chest pain. 25 tablet 12   ondansetron (ZOFRAN ODT) 4 MG disintegrating tablet Take 1 tablet (4 mg total) by mouth every 8 (eight) hours as needed for nausea or vomiting. 20 tablet 0   OneTouch Delica Lancets 93X MISC Use to check blood sugar TID.  E11.69 100 each 6   pantoprazole (PROTONIX) 40 MG tablet Take 40 mg by mouth 2 (two) times daily. (Patient not taking: Reported on 05/16/2021)     potassium chloride SA (KLOR-CON) 20 MEQ tablet Take 3 tablets (60 mEq total) by mouth daily. Take an additional 40 meq with every dose of METOLAZONE 66 tablet 6   RESTASIS 0.05 % ophthalmic emulsion Place 1 drop into both eyes 2 (two) times daily.  12   rosuvastatin (CRESTOR) 10 MG tablet Take 1 tablet (10 mg total) by mouth daily. 90 tablet 3   torsemide (DEMADEX) 20 MG tablet Take 4 tablets (80 mg total) by mouth daily. Can take an additional 2 tablets as needed for increased weight gain 120 tablet 5   No current facility-administered medications on file prior to visit.   Review of System: Review of Systems  HENT:  Positive for sinus pain.        Frontal and maxillary tenderness Red throat - drainage   All other systems reviewed and are negative.   Objective:  BP 130/83 (BP Location: Left Arm, Patient Position: Sitting, Cuff Size: Large)   Pulse 76   Temp (!) 97.5 F (36.4 C) (Temporal)   Ht $R'5\' 7"'hb$  (1.702 m)   Wt 277 lb 3.2 oz (125.7 kg)   SpO2 100%   BMI 43.42 kg/m    Physical Exam: General Appearance: Well nourished, in  no apparent distress. Eyes: PERRLA, EOMs, conjunctiva no swelling or erythema Sinuses: No Frontal/maxillary tenderness ENT/Mouth: Ext aud canals clear, TMs without erythema, bulging. No erythema, swelling, or exudate on post pharynx.  Tonsils not swollen or erythematous. Hearing normal.  Neck: Supple, thyroid normal.  Respiratory: Respiratory effort normal, BS equal bilaterally without rales, rhonchi, wheezing or stridor.  Cardio: RRR with no MRGs. Brisk peripheral pulses without  edema.  Abdomen: Soft, + BS.  Non tender, no guarding, rebound, hernias, masses. Lymphatics: Non tender without lymphadenopathy.  Musculoskeletal: Full ROM, 5/5 strength, normal gait.  Skin: Warm, dry without rashes, lesions, ecchymosis.  Neuro: Cranial nerves intact. Normal muscle tone, no cerebellar symptoms. Sensation intact.  Psych: Awake and oriented X 3, normal affect, Insight and Judgment appropriate.   Paulla was seen today for hospitalization follow-up.  Diagnoses and all orders for this visit:  Gastroesophageal reflux disease without esophagitis/Gastroparesis Discussed eating small frequent meal, reduction in acidic foods, fried foods ,spicy foods, alcohol caffeine and tobacco and certain medications. Avoid laying down after eating 32mins-1hour, elevated head of the bed.   Constipation, unspecified constipation type -     linaclotide (LINZESS) 72 MCG capsule; Take 1 capsule (72 mcg total) by mouth daily before breakfast. If BM not achieve with 1 capsule she may take 2 daily. Stop if diarrhea occurs  Type 2 diabetes mellitus with stage 3 chronic kidney disease, with long-term current use of insulin, unspecified whether stage 3a or 3b CKD (HCC) A1C 5.8 well controlled . She monitors her foods that are high in carbohydrates are the following rice, potatoes, breads, sugars, and pastas.  Reduction in the intake (eating) will assist in lowering your blood sugars.   Hospital discharge follow-up Per discharge  summary  Schedule an appointment with [name], NP (Internal Medicine) in 2 weeks (05/20/2021) Schedule an appointment with Buford Dresser, MD (Cardiology) in 1 week (05/13/2021)  Acute frontal sinusitis, recurrence not specified -     amoxicillin-clavulanate (AUGMENTIN) 875-125 MG tablet; Take 1 tablet by mouth 2 (two) times daily. -     fluticasone (FLONASE) 50 MCG/ACT nasal spray; Place 2 sprays into both nostrils daily. -     loratadine (CLARITIN) 10 MG tablet; Take 1 tablet (10 mg total) by mouth daily.  This note has been created with Surveyor, quantity. Any transcriptional errors are unintentional.   [name], NP 05/24/2021, 9:36 AM

## 2021-05-25 ENCOUNTER — Ambulatory Visit (HOSPITAL_BASED_OUTPATIENT_CLINIC_OR_DEPARTMENT_OTHER): Payer: Medicaid Other | Admitting: Internal Medicine

## 2021-05-25 ENCOUNTER — Ambulatory Visit (HOSPITAL_COMMUNITY): Payer: Medicare Other

## 2021-05-25 DIAGNOSIS — S82891A Other fracture of right lower leg, initial encounter for closed fracture: Secondary | ICD-10-CM | POA: Diagnosis not present

## 2021-05-27 ENCOUNTER — Ambulatory Visit (HOSPITAL_COMMUNITY): Payer: Medicare Other

## 2021-05-30 ENCOUNTER — Ambulatory Visit (INDEPENDENT_AMBULATORY_CARE_PROVIDER_SITE_OTHER): Payer: Medicare HMO | Admitting: Internal Medicine

## 2021-05-30 ENCOUNTER — Other Ambulatory Visit: Payer: Self-pay

## 2021-05-30 VITALS — BP 110/74 | HR 81 | Ht 67.0 in | Wt 269.0 lb

## 2021-05-30 DIAGNOSIS — I480 Paroxysmal atrial fibrillation: Secondary | ICD-10-CM

## 2021-05-30 DIAGNOSIS — G4733 Obstructive sleep apnea (adult) (pediatric): Secondary | ICD-10-CM

## 2021-05-30 DIAGNOSIS — I10 Essential (primary) hypertension: Secondary | ICD-10-CM

## 2021-05-30 NOTE — Progress Notes (Signed)
PCP: [name], NP Primary Cardiologist: Dr [name] Primary EP: Dr Veryl Speak is a 65 y.o. female who presents today for routine electrophysiology followup.  Since last being seen in our clinic, the patient reports doing reasonably well.  She underwent mitral valve repair, tricuspid valve repiar, MAZE, and LAA clip 12/29/20 by Dr Roxan Hockey.  She had substantial issues with atypical atrial flutter, CHF, and renal failure afterwards.  She continues to make progress.  Her last cardioversion was in August.  She appears to be predominantly in sinus rhythm since that time.  She has occasional chest pain at rest but overall feels "much better".  SOB and edema are stable.   Today, she denies symptoms of palpitations,   dizziness, presyncope, or syncope.  The patient is otherwise without complaint today.   Past Medical History:  Diagnosis Date   Allergic rhinitis    Arthritis    Asthma    Chest pain 12/27/2017   Chronic diastolic CHF (congestive heart failure) (HCC)    COPD (chronic obstructive pulmonary disease) (HCC)    Depression    DM (diabetes mellitus) ([place])    DVT (deep vein thrombosis) in pregnancy    Dysrhythmia    atrial fibrilation   GERD (gastroesophageal reflux disease)    Headache(784.0)    HTN (hypertension)    Hyperlipidemia    Obesity    Pneumonia 04/2018   RIGHT LOBE   Shortness of breath    Sleep apnea    compliant with CPAP   Past Surgical History:  Procedure Laterality Date   ABDOMINAL HYSTERECTOMY     BUBBLE STUDY  11/26/2020   Procedure: BUBBLE STUDY;  Surgeon: Buford Dresser, MD;  Location: [place];  Service: Cardiovascular;;   CARDIOVERSION N/A 05/06/2020   Procedure: CARDIOVERSION;  Surgeon: Buford Dresser, MD;  Location: [facility] ENDOSCOPY;  Service: Cardiovascular;  Laterality: N/A;   CARDIOVERSION N/A 11/04/2020   Procedure: CARDIOVERSION;  Surgeon: Lelon Perla, MD;  Location: [place];  Service:  Cardiovascular;  Laterality: N/A;   CARDIOVERSION N/A 02/01/2021   Procedure: CARDIOVERSION;  Surgeon: Jolaine Artist, MD;  Location: [place];  Service: Cardiovascular;  Laterality: N/A;   CARDIOVERSION N/A 02/09/2021   Procedure: CARDIOVERSION;  Surgeon: Jolaine Artist, MD;  Location: [place];  Service: Cardiovascular;  Laterality: N/A;   CARDIOVERSION N/A 04/19/2021   Procedure: CARDIOVERSION;  Surgeon: Fay Records, MD;  Location: [place];  Service: Cardiovascular;  Laterality: N/A;   CATARACT EXTRACTION, BILATERAL     CHOLECYSTECTOMY     COLONOSCOPY WITH PROPOFOL N/A 03/05/2015   Procedure: COLONOSCOPY WITH PROPOFOL;  Surgeon: Carol Ada, MD;  Location: [facility] ENDOSCOPY;  Service: Endoscopy;  Laterality: N/A;   DIAGNOSTIC LAPAROSCOPY     ingrown hallux Left    KNEE SURGERY     LEFT HEART CATH AND CORONARY ANGIOGRAPHY N/A 12/31/2017   Procedure: LEFT HEART CATH AND CORONARY ANGIOGRAPHY;  Surgeon: Charolette Forward, MD;  Location: [place] CV [facility];  Service: Cardiovascular;  Laterality: N/A;   MAZE N/A 12/29/2020   Procedure: MAZE;  Surgeon: [name], MD;  Location: [place];  Service: Open Heart Surgery;  Laterality: N/A;   MITRAL VALVE REPAIR N/A 12/29/2020   Procedure: MITRAL VALVE REPAIR USING CARBOMEDICS ANNULOFLEX RING SIZE 30;  Surgeon: [name], MD;  Location: [place];  Service: Open Heart Surgery;  Laterality: N/A;   RIGHT HEART CATH N/A 12/22/2020   Procedure: RIGHT HEART CATH;  Surgeon: Larey Dresser,  MD;  Location: [place] CV LAB;  Service: Cardiovascular;  Laterality: N/A;   RIGHT HEART CATH N/A 01/31/2021   Procedure: RIGHT HEART CATH;  Surgeon: Jolaine Artist, MD;  Location: [place] [facility];  Service: Cardiovascular;  Laterality: N/A;   TEE WITHOUT CARDIOVERSION N/A 11/26/2020   Procedure: TRANSESOPHAGEAL ECHOCARDIOGRAM (TEE);  Surgeon: Buford Dresser, MD;  Location: [facility] ENDOSCOPY;  Service: Cardiovascular;  Laterality:  N/A;   TEE WITHOUT CARDIOVERSION N/A 12/29/2020   Procedure: TRANSESOPHAGEAL ECHOCARDIOGRAM (TEE);  Surgeon: [name], MD;  Location: [place];  Service: Open Heart Surgery;  Laterality: N/A;   TRICUSPID VALVE REPLACEMENT N/A 12/29/2020   Procedure: 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 RING SIZE 34;  Surgeon: [name], MD;  Location: [place];  Service: Open Heart Surgery;  Laterality: N/A;    ROS- all systems are reviewed and negatives except as per HPI above  Current Outpatient Medications  Medication Sig Dispense Refill   Accu-Chek Softclix Lancets lancets Use as instructed to check blood sugar three times daily. E11.69 100 each 12   albuterol (VENTOLIN HFA) 108 (90 Base) MCG/ACT inhaler Inhale 2 puffs into the lungs every 4 (four) hours as needed (Asthma).     Alcohol Swabs (B-D SINGLE USE SWABS REGULAR) PADS 1 each by Other route daily.     ALPRAZolam (XANAX) 0.5 MG tablet Take 1 tablet (0.5 mg total) by mouth 2 (two) times daily as needed for anxiety. 30 tablet 0   amiodarone (PACERONE) 200 MG tablet Take 200 mg by mouth daily.     amoxicillin-clavulanate (AUGMENTIN) 875-125 MG tablet Take 1 tablet by mouth 2 (two) times daily. 20 tablet 0   apixaban (ELIQUIS) 5 MG TABS tablet Take 1 tablet (5 mg total) by mouth 2 (two) times daily. 180 tablet 3   B-D ULTRAFINE III SHORT PEN 31G X 8 MM MISC 1 each by Other route in the morning, at noon, in the evening, and at bedtime.     blood glucose meter kit and supplies KIT Dispense based on patient and insurance preference. Use up to four times daily as directed. 1 each 0   Blood Glucose Monitoring Suppl (ACCU-CHEK GUIDE ME) w/Device KIT Use as instructed to check blood sugar three times daily. E11.69 1 kit 0   Blood Glucose Monitoring Suppl (ONETOUCH VERIO) w/Device KIT Use to check blood sugar TID. E11.69 1 kit 0   colchicine 0.6 MG tablet Taking 1/2 tablet by mouth daily     docusate sodium (COLACE) 250 MG capsule  Take 100 mg by mouth daily.     Dulaglutide (TRULICITY) 1.5 OA/4.1YS SOPN Inject 1.5 mg into the skin once a week. 0.5 mL 3   DULoxetine (CYMBALTA) 60 MG capsule Take 1 capsule (60 mg total) by mouth daily. 90 capsule 3   EPINEPHrine 0.3 mg/0.3 mL IJ SOAJ injection Inject 0.3 mg into the muscle as needed for anaphylaxis. 1 each 1   fluticasone (FLONASE) 50 MCG/ACT nasal spray Place 2 sprays into both nostrils daily. 16 g 6   glucose blood (ONETOUCH VERIO) test strip Use to check blood sugar TID. E11.69 100 each 6   insulin aspart (NOVOLOG) 100 UNIT/ML injection Inject 12 Units into the skin 3 (three) times daily before meals. For blood sugars 0-199 give 0 units of insulin, 201-250 give 4 units, 251-300 give 6 units, 301-350 give 8 units, 351-400 give 10 units,> 400 give 12 units and call M.D. (Patient taking differently: Inject 5-10 Units into  the skin 3 (three) times daily before meals. For blood sugars 0-199 give 0 units of insulin, 201-250 give 4 units, 251-300 give 6 units, 301-350 give 8 units, 351-400 give 10 units,> 400 give 12 units and call M.D.) 10 mL 3   linaclotide (LINZESS) 72 MCG capsule Take 1 capsule (72 mcg total) by mouth daily before breakfast. If BM not achieve with 1 capsule she may take 2 daily. Stop if diarrhea occurs 60 capsule 1   loratadine (CLARITIN) 10 MG tablet Take 1 tablet (10 mg total) by mouth daily. 30 tablet 11   metolazone (ZAROXOLYN) 2.5 MG tablet Take 1 tablet (2.5 mg total) by mouth 2 (two) times a week. Take every Monday and Friday 90 tablet 3   metoprolol succinate (TOPROL XL) 25 MG 24 hr tablet Take 1.5 tablets (37.5 mg total) by mouth daily. 30 tablet 11   Multiple Vitamins-Minerals (MULTIVITAMIN WOMEN PO) Take 1 tablet by mouth daily.     nitroGLYCERIN (NITROSTAT) 0.4 MG SL tablet Place 1 tablet (0.4 mg total) under the tongue every 5 (five) minutes x 3 doses as needed for chest pain. 25 tablet 12   ondansetron (ZOFRAN ODT) 4 MG disintegrating tablet Take 1  tablet (4 mg total) by mouth every 8 (eight) hours as needed for nausea or vomiting. 20 tablet 0   OneTouch Delica Lancets 14N MISC Use to check blood sugar TID.  E11.69 100 each 6   pantoprazole (PROTONIX) 40 MG tablet Take 40 mg by mouth 2 (two) times daily.     potassium chloride SA (KLOR-CON) 20 MEQ tablet Take 3 tablets (60 mEq total) by mouth daily. Take an additional 40 meq with every dose of METOLAZONE 66 tablet 6   RESTASIS 0.05 % ophthalmic emulsion Place 1 drop into both eyes 2 (two) times daily.  12   rosuvastatin (CRESTOR) 10 MG tablet Take 1 tablet (10 mg total) by mouth daily. 90 tablet 3   torsemide (DEMADEX) 20 MG tablet Take 4 tablets (80 mg total) by mouth daily. Can take an additional 2 tablets as needed for increased weight gain 120 tablet 5   No current facility-administered medications for this visit.    Physical Exam: Vitals:   05/30/21 1138  BP: 110/74  Pulse: 81  SpO2: 97%  Weight: 269 lb (122 kg)  Height: _0  (1.702 m)    GEN- The patient is overweight  appearing, alert and oriented x 3 today.   Head- normocephalic, atraumatic Eyes-  Sclera clear, conjunctiva pink Ears- hearing intact Oropharynx- clear Lungs- Clear to ausculation bilaterally, normal work of breathing Heart- Regular rate and rhythm  GI- soft, NT, ND, + BS Extremities- no clubbing, cyanosis, or edema  Wt Readings from Last 3 Encounters:  05/30/21 269 lb (122 kg)  05/24/21 277 lb 3.2 oz (125.7 kg)  05/16/21 293 lb 6.4 oz (133.1 kg)    EKG tracing ordered today is personally reviewed and shows likely sinus rhythm.  I cannot completely exclude a 2:1 flutter, though I really think that this is unlikely.  Incomplete RBBB  Assessment and Plan:  Longstanding persistent afib S/p MAZE and LAA clilp 12/29/20 by Dr Roxan Hockey. She has failed medical therapy with tikosyn and amiodarone previously. Doing well with eliquis It appears that her afib/ atypical atrial flutter has improved  substantially when compared to her early post operative months.  I would advise that we continue amiodarone and watchful waiting at this point. If her atypical atrial flutter returns, we will consider  ablation vs PPM/AV nodal ablation at that point.  2. S/p MV/TV repair with MAZE 12/30/20 Stable No change required today  3. Chronic combined systolic and diastolic CHF EF has improved post operatively from valve repair to 40-45% She continues to have NYHA Class III symptoms at times. Treatment is complicated by stage IV renal failure.  Dr [name] is following closely and per his last note has referred to nephrology  4. CAD No ischemic symptoms No changes  5. Obesity Body mass index is 42.13 kg/m. Lifestyle modification is advised   Risks, benefits and potential toxicities for medications prescribed and/or refilled reviewed with patient today.   Very complicated patient.  She is at risk for decompensation/ hospitalization.  A high level of decision making was required for this visit.  Follow-up with AF clinic in 3 months   Thompson Grayer MD, [facility] 05/30/2021 11:55 AM

## 2021-05-30 NOTE — Patient Instructions (Addendum)
Medication Instructions:  Your physician recommends that you continue on your current medications as directed. Please refer to the Current Medication list given to you today. *If you need a refill on your cardiac medications before your next appointment, please call your pharmacy*  Lab Work: None. If you have labs (blood work) drawn today and your tests are completely normal, you will receive your results only by: MyChart Message (if you have MyChart) OR A paper copy in the mail If you have any lab test that is abnormal or we need to change your treatment, we will call you to review the results.  Testing/Procedures: None.  Follow-Up: At [facility], you and your health needs are our priority.  As part of our continuing mission to provide you with exceptional heart care, we have created designated Provider Care Teams.  These Care Teams include your primary Cardiologist (physician) and Advanced Practice Providers (APPs -  Physician Assistants and Nurse Practitioners) who all work together to provide you with the care you need, when you need it.  Your physician wants you to follow-up in: 3 months with the Afib Clinic. They will contact you to schedule.  We recommend signing up for the patient portal called "MyChart".  Sign up information is provided on this After Visit Summary.  MyChart is used to connect with patients for Virtual Visits (Telemedicine).  Patients are able to view lab/test results, encounter notes, upcoming appointments, etc.  Non-urgent messages can be sent to your provider as well.   To learn more about what you can do with MyChart, go to [email or web address].    Any Other Special Instructions Will Be Listed Below (If Applicable).         

## 2021-05-31 ENCOUNTER — Encounter (HOSPITAL_COMMUNITY): Payer: Medicare Other

## 2021-06-02 ENCOUNTER — Other Ambulatory Visit: Payer: Self-pay

## 2021-06-02 ENCOUNTER — Telehealth (HOSPITAL_COMMUNITY): Payer: Self-pay | Admitting: Cardiology

## 2021-06-02 ENCOUNTER — Ambulatory Visit (HOSPITAL_COMMUNITY)
Admission: RE | Admit: 2021-06-02 | Discharge: 2021-06-02 | Disposition: A | Discharge status: Home / Self Care | Payer: Medicare HMO | Source: Ambulatory Visit | Attending: Internal Medicine | Admitting: Internal Medicine

## 2021-06-02 DIAGNOSIS — I5023 Acute on chronic systolic (congestive) heart failure: Secondary | ICD-10-CM | POA: Diagnosis not present

## 2021-06-02 LAB — BASIC METABOLIC PANEL
Anion gap: 13 (ref 5–15)
BUN: 51 mg/dL — ABNORMAL HIGH (ref 8–23)
CO2: 33 mmol/L — ABNORMAL HIGH (ref 22–32)
Calcium: 9.8 mg/dL (ref 8.9–10.3)
Chloride: 94 mmol/L — ABNORMAL LOW (ref 98–111)
Creatinine, Ser: 3.32 mg/dL — ABNORMAL HIGH (ref 0.44–1.00)
GFR, Estimated: 15 mL/min — ABNORMAL LOW (ref >60)
Glucose, Bld: 139 mg/dL — ABNORMAL HIGH (ref 70–99)
Potassium: 3.4 mmol/L — ABNORMAL LOW (ref 3.5–5.1)
Sodium: 140 mmol/L (ref 135–145)

## 2021-06-02 NOTE — Telephone Encounter (Signed)
Patient left message on triage with concerns about constipation  Returned call-LMOM

## 2021-06-09 ENCOUNTER — Inpatient Hospital Stay (INDEPENDENT_AMBULATORY_CARE_PROVIDER_SITE_OTHER): Payer: Medicare Other | Admitting: Primary Care

## 2021-06-14 ENCOUNTER — Other Ambulatory Visit (INDEPENDENT_AMBULATORY_CARE_PROVIDER_SITE_OTHER): Payer: Self-pay | Admitting: Primary Care

## 2021-06-14 ENCOUNTER — Telehealth (HOSPITAL_COMMUNITY): Payer: Self-pay

## 2021-06-14 DIAGNOSIS — Z760 Encounter for issue of repeat prescription: Secondary | ICD-10-CM

## 2021-06-14 DIAGNOSIS — E1142 Type 2 diabetes mellitus with diabetic polyneuropathy: Secondary | ICD-10-CM

## 2021-06-14 DIAGNOSIS — F32A Depression, unspecified: Secondary | ICD-10-CM

## 2021-06-14 MED ORDER — TRULICITY 1.5 MG/0.5ML ~~LOC~~ SOAJ: 1.5000 mg | SUBCUTANEOUS | 3 refills | Status: DC

## 2021-06-14 MED ORDER — DULOXETINE HCL 60 MG PO CPEP: 60.0000 mg | ORAL_CAPSULE | Freq: Every day | ORAL | 3 refills | Status: DC

## 2021-06-14 MED ORDER — ACCU-CHEK SOFTCLIX LANCETS MISC: 12 refills | Status: DC

## 2021-06-14 NOTE — Telephone Encounter (Signed)
Spoke to [place] who reports she is doing well and agreed to visit in clinic on Monday 10/31 I will meet her there to get back on track with paramedicine visits following my absence.

## 2021-06-17 ENCOUNTER — Telehealth (HOSPITAL_COMMUNITY): Payer: Self-pay

## 2021-06-17 NOTE — Telephone Encounter (Signed)
Called to confirm/remind patient of their appointment at the [facility] on 06/20/21.   Patient reminded to bring all medications and/or complete list.  Confirmed patient has transportation. Gave directions, instructed to utilize [place] parking.  Confirmed appointment prior to ending call.

## 2021-06-20 ENCOUNTER — Encounter (HOSPITAL_COMMUNITY): Payer: Self-pay

## 2021-06-20 ENCOUNTER — Encounter (HOSPITAL_COMMUNITY): Payer: Medicare HMO

## 2021-06-20 ENCOUNTER — Telehealth (HOSPITAL_COMMUNITY): Payer: Self-pay

## 2021-06-20 ENCOUNTER — Other Ambulatory Visit: Payer: Self-pay

## 2021-06-20 ENCOUNTER — Ambulatory Visit (HOSPITAL_COMMUNITY)
Admission: RE | Admit: 2021-06-20 | Discharge: 2021-06-20 | Disposition: A | Discharge status: Home / Self Care | Payer: Medicare HMO | Source: Ambulatory Visit | Attending: Adult Health | Admitting: Adult Health

## 2021-06-20 ENCOUNTER — Other Ambulatory Visit (HOSPITAL_COMMUNITY): Payer: Self-pay

## 2021-06-20 VITALS — BP 110/70 | HR 81 | Wt 291.0 lb

## 2021-06-20 DIAGNOSIS — I2510 Atherosclerotic heart disease of native coronary artery without angina pectoris: Secondary | ICD-10-CM | POA: Diagnosis not present

## 2021-06-20 DIAGNOSIS — I38 Endocarditis, valve unspecified: Secondary | ICD-10-CM | POA: Insufficient documentation

## 2021-06-20 DIAGNOSIS — Z79899 Other long term (current) drug therapy: Secondary | ICD-10-CM | POA: Diagnosis not present

## 2021-06-20 DIAGNOSIS — I5042 Chronic combined systolic (congestive) and diastolic (congestive) heart failure: Secondary | ICD-10-CM | POA: Diagnosis not present

## 2021-06-20 DIAGNOSIS — G4733 Obstructive sleep apnea (adult) (pediatric): Secondary | ICD-10-CM

## 2021-06-20 DIAGNOSIS — I5022 Chronic systolic (congestive) heart failure: Secondary | ICD-10-CM | POA: Diagnosis not present

## 2021-06-20 DIAGNOSIS — Z6841 Body Mass Index (BMI) 40.0 and over, adult: Secondary | ICD-10-CM | POA: Diagnosis not present

## 2021-06-20 DIAGNOSIS — I480 Paroxysmal atrial fibrillation: Secondary | ICD-10-CM | POA: Diagnosis not present

## 2021-06-20 DIAGNOSIS — H52222 Regular astigmatism, left eye: Secondary | ICD-10-CM | POA: Diagnosis not present

## 2021-06-20 DIAGNOSIS — E119 Type 2 diabetes mellitus without complications: Secondary | ICD-10-CM | POA: Insufficient documentation

## 2021-06-20 DIAGNOSIS — N184 Chronic kidney disease, stage 4 (severe): Secondary | ICD-10-CM | POA: Diagnosis not present

## 2021-06-20 DIAGNOSIS — E669 Obesity, unspecified: Secondary | ICD-10-CM | POA: Insufficient documentation

## 2021-06-20 DIAGNOSIS — H40023 Open angle with borderline findings, high risk, bilateral: Secondary | ICD-10-CM | POA: Diagnosis not present

## 2021-06-20 DIAGNOSIS — H5201 Hypermetropia, right eye: Secondary | ICD-10-CM | POA: Diagnosis not present

## 2021-06-20 DIAGNOSIS — H524 Presbyopia: Secondary | ICD-10-CM | POA: Diagnosis not present

## 2021-06-20 DIAGNOSIS — Z794 Long term (current) use of insulin: Secondary | ICD-10-CM | POA: Diagnosis not present

## 2021-06-20 DIAGNOSIS — H43813 Vitreous degeneration, bilateral: Secondary | ICD-10-CM | POA: Diagnosis not present

## 2021-06-20 DIAGNOSIS — H26493 Other secondary cataract, bilateral: Secondary | ICD-10-CM | POA: Diagnosis not present

## 2021-06-20 DIAGNOSIS — H16223 Keratoconjunctivitis sicca, not specified as Sjogren's, bilateral: Secondary | ICD-10-CM | POA: Diagnosis not present

## 2021-06-20 LAB — BASIC METABOLIC PANEL
Anion gap: 9 (ref 5–15)
BUN: 36 mg/dL — ABNORMAL HIGH (ref 8–23)
CO2: 32 mmol/L (ref 22–32)
Calcium: 9.5 mg/dL (ref 8.9–10.3)
Chloride: 101 mmol/L (ref 98–111)
Creatinine, Ser: 2.75 mg/dL — ABNORMAL HIGH (ref 0.44–1.00)
GFR, Estimated: 19 mL/min — ABNORMAL LOW (ref >60)
Glucose, Bld: 94 mg/dL (ref 70–99)
Potassium: 3.7 mmol/L (ref 3.5–5.1)
Sodium: 142 mmol/L (ref 135–145)

## 2021-06-20 MED ORDER — TORSEMIDE 20 MG PO TABS: 100.0000 mg | ORAL_TABLET | Freq: Every day | ORAL | 4 refills | Status: DC

## 2021-06-20 NOTE — Telephone Encounter (Signed)
Spoke to [name] who confirmed her appointment for today, I plan to meet her at 12:00 at the clinic. Reminder for her to bring her medications and pill box.

## 2021-06-20 NOTE — Progress Notes (Signed)
Advanced Heart Failure Clinic Note    PCP: [name], NP PCP-Cardiologist: Buford Dresser, MD  [facility]: Dr. [name]   Reason for Visit:Heart Failure   HPI: [name] is a 65 y.o. with a history of morbid obesity, chronic diastolic and systolic HF with mildly reduced LVEF (45-50%), severe MR/TR, atrial fibrillation on Eliquis, HTN, and DMII.    She has a known history of HF with mildly reduced LVEF of 45-50% with cath in 2019 with mild, non-obstructive CAD. Also with history of difficult to control Afib with failed Tikosyn, later switched to amiodarone, dig, metop and apixaban for [facility]. Saw Dr. Harrell Gave in clinic on 11/22/20 where she was experiencing worsening SOB, weight gain, LE edema and chest pressure. She declined ER visit at that time. She was changed from lasix to torsemide for diuresis. Also planned for CV surgery evaluation for severe MR.    Admitted 12/19/20 with worsening HF.  LHC 5/19 non-obstructive CAD. RHC 12/22/20 with elevated filling pressures and low CI.    Underwent MV/TV repair with Maze on 12/30/20. Post-operatively, she struggled  with low co-ox, progressive renal failure and volume overload. AHF team was consulted to assist w/ further management. Post operative Echo showed LVEF 55% RV moderately HK. Moderate TR. No significant MR. No effusion. Hemodynamics improved w/ milrinone. Suspected of having post-op ATN. Nephrology assisted w/ transition back to PO diuretics. She was discharged on 5/28. D/w wt was 281 lb. SCr was 2.8. Nephrology recommended home on torsemide 60 mg daily. She was also in Afib day of d/c. Recommendations were to consider DC-CV in several weeks after atrial irritation settles down from Maze.   She was seen in clinic 01/25/21 for post hospital f/u. ReDs clip 42%. Plan for DCCV.   She was seen in ED 01/29/21 with SOB and CP, with decreased urinary output. She was admitted for A/CHF, diuresed with IV lasix, RHC showed low filling  pressures and moderately reduced CO. She underwent successful DCCV 02/01/21. 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 She was discharged with CR referral and instructions to follow up with nephrology.   Seen in ED 02/07/21 for leg swelling & CP. She was back in Afib, rate controlled. She was loaded w amiodarone gtt, repeat successful DCCV on 02/09/21. 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 If she returned to Afib, would need evaluation for AV node ablation/PPM.   She was back in A fib and volume overloaded 02/22/21 at follow up. Torsemide was increased to 80 mg bid and amio increased to 200 mg bid. (She did not tell staff she was out of amio).    She returned to HF clinic 2 days later and was in A fib RVR with rate in the 130s. SOB with minimal exertion. She was sent to the ED for further evaluation. EP consulted. She converted to NSR on IV amiodarone. SCr trended up and diuretics held. Repeat echo showed new reduction in EF, in setting of AFL with RVR,  EF 30-35%, moderate LVH, moderate RV dysfunction. Transitioned to po amio and torsemide, discharge weight 271 lbs.   Hospital f/u 7/22 NYHA II-early III, volume good. Weight 275 lbs.   S/p DCCV 04/19/21, but back in afib at follow up a week later at [facility] appointment. Weight up 18 lbs, and more SOB. EP felt nothing else to offer her to maintain SR. Beta Blocker increased and amio 400 bid continued. We suggested increasing  torsemide to 80 mg bid with close HF follow up.   She was seen in the HF clinic on 04/28/21. Volume overloaded and given 80 mg IV lasix + 5 mg metolazone in the clinic with good response. She was then instructed 5 mg daily x3 day with 80 mg torsemide. EKG during the visit showed SR. Cardiomems was also considered.   Admitted 9/12-9/16/22 with CP and AKI on CKD IV. SCr 3.12 in ED, likely due to over diuresis. AHF consulted, diuretics held, and she  received gentle IV hydration. She remained in AFL rate controlled.  CP felt to be non-cardiac in nature and possibly psychosomatic. She had N/V, CT ab/pelvis was normal. Symptoms improved with GI cocktail and she was started on PPI at discharge. SCr trended back to her baseline (2.9 day of d/c) her metolazone was stopped and home dose of HF medications were continued at discharge, weight 268 lbs.  Saw Dr [name] the end of September and was volume overloaded.  Metolazone was increased to twice a week.   Today she returns for HF follow up. Overall feeling ok but having intermittent chest discomfort. SOB with exertion. Says she is stressed out about paying her rent. Denies PND/Orthopnea. Not Appetite ok. Last night had ox tail and chicken wings. No fever or chills.  She did not take medications today.   Review of systems complete and found to be negative unless listed in HPI.    Cardiac Studies - Echo (11/13): EF 60-65%, moderate TR - Echo (11/14): EF 60-65%, severe TR, PA peak pressure 47 mmHg - Echo (5/15): EF 50-55% - Exercise stress test (7/6): A. Fib. with controlled vent. response OLD inferior MI and non sp. T wave changes at baseline. - Echo (8/17): EF 50-55%, moderate MR - Exercise stress test (8/17): Atrial Fib. Q wave in lead 3 and AVF with nonsp T wave changes at baseline - Echo (5/19): EF 55-60%, moderate MR - Exercise stress test (5/19): Afib, normal. - LHC (5/19): Prox LAD lesion is 10% stenosed, Dist LM to Ost LAD lesion is 10% stenosed, Ost Cx lesion is 15% stenosed, LV systolic function normal, LVEDP is normal. - Echo (11/19): EF 45-50%, severe MR/TR - Echo (11/21): EF 45-50%, severe MR (rheumatic)/TR - Echo (4/22): EF 45-50%, severe MR mean gradient 2.0 mmHg, severe TR - Echo (5/22): EF 60-65%, s/p MVR, trace MR mean gradient 7.0 mmHg, moderate TR - RHC (5/22): Elevated R > L heart filling pressures with PAPi low but not markedly so (1.8) suggestive of a significant component  of RV dysfunction. Prominent v-waves in PCWP and RA pressure tracings suggestive of significant MR and TR. Cardiac index low. - RHC (6/22): low filling pressures, moderate reduced CO. - Echo (7/22): EF 30-35%, moderate LVH, moderate RV dysfunction, PASP 48, s/p MV repair with mild MR and mean gradient 10 mmHg with HR around 130, s/p TV repair with mild-moderate TR, dilated IVC.   RHC 01/31/21  RA = 5 RV = 33/2 PA = 35/13 (22) PCW = 13 Fick cardiac output/index = 4.3/2.0 PVR = 2.1 WU FA sat = 97% PA sat = 54%, 54% - DCCV (02/01/21): successful conversion to NSR. - DCCV (02/09/21): successful conversion to NSR. -DCCV (830/22): successful conversion to NSR.  ROS: All systems negative except as listed in HPI, PMH and Problem List.  SH:  Social History   Socioeconomic History   Marital status: Significant Other    Spouse name: Not on file   Number of children: 2  Years of education: Not on file   Highest education level: Some college, no degree  Occupational History   Not on file  Tobacco Use   Smoking status: Never   Smokeless tobacco: Never  Vaping Use   Vaping Use: Never used  Substance and Sexual Activity   Alcohol use: No   Drug use: No   Sexual activity: Not Currently  Other Topics Concern   Not on file  Social History Narrative   Pt lives in [place] with spouse.  2 grown children.   Previously worked in [employer] at [employer].  Now on disability   Social Determinants of Health   Financial Resource Strain: Low Risk    Difficulty of Paying Living Expenses: Not very hard  Food Insecurity: No Food Insecurity   Worried About Charity fundraiser in the Last Year: Never true   Ran Out of Food in the Last Year: Never true  Transportation Needs: No Transportation Needs   Lack of Transportation (Medical): No   Lack of Transportation (Non-Medical): No  Physical Activity: Not on file  Stress: Not on file  Social Connections: Not on file  Intimate Partner Violence: Not on  file   FH:  Family History  Problem Relation Age of Onset   Cancer Mother    Hypertension Mother    Cancer Father    CAD Other    Hypertension Sister    Hypertension Brother    Past Medical History:  Diagnosis Date   Allergic rhinitis    Arthritis    Asthma    Chest pain 12/27/2017   Chronic diastolic CHF (congestive heart failure) (HCC)    COPD (chronic obstructive pulmonary disease) ([place])    Depression    DM (diabetes mellitus) ([name])    DVT (deep vein thrombosis) in pregnancy    Dysrhythmia    atrial fibrilation   GERD (gastroesophageal reflux disease)    Headache(784.0)    HTN (hypertension)    Hyperlipidemia    Obesity    Pneumonia 04/2018   RIGHT LOBE   Shortness of breath    Sleep apnea    compliant with CPAP   Current Outpatient Medications  Medication Sig Dispense Refill   Accu-Chek Softclix Lancets lancets Use as instructed 100 each 12   albuterol (VENTOLIN HFA) 108 (90 Base) MCG/ACT inhaler Inhale 2 puffs into the lungs every 4 (four) hours as needed (Asthma).     Alcohol Swabs (B-D SINGLE USE SWABS REGULAR) PADS 1 each by Other route daily.     ALPRAZolam (XANAX) 0.5 MG tablet Take 1 tablet (0.5 mg total) by mouth 2 (two) times daily as needed for anxiety. 30 tablet 0   amiodarone (PACERONE) 200 MG tablet Take 200 mg by mouth daily.     apixaban (ELIQUIS) 5 MG TABS tablet Take 1 tablet (5 mg total) by mouth 2 (two) times daily. 180 tablet 3   B-D ULTRAFINE III SHORT PEN 31G X 8 MM MISC 1 each by Other route in the morning, at noon, in the evening, and at bedtime.     blood glucose meter kit and supplies KIT Dispense based on patient and insurance preference. Use up to four times daily as directed. 1 each 0   Blood Glucose Monitoring Suppl (ACCU-CHEK GUIDE ME) w/Device KIT Use as instructed to check blood sugar three times daily. E11.69 1 kit 0   Blood Glucose Monitoring Suppl (ONETOUCH VERIO) w/Device KIT Use to check blood sugar TID. E11.69 1 kit 0  colchicine 0.6 MG tablet Taking 1/2 tablet by mouth daily     docusate sodium (COLACE) 250 MG capsule Take 100 mg by mouth daily.     Dulaglutide (TRULICITY) 1.5 HT/3.4KA SOPN Inject 1.5 mg into the skin once a week. 0.5 mL 3   DULoxetine (CYMBALTA) 60 MG capsule Take 1 capsule (60 mg total) by mouth daily. 90 capsule 3   EPINEPHrine 0.3 mg/0.3 mL IJ SOAJ injection Inject 0.3 mg into the muscle as needed for anaphylaxis. 1 each 1   fluticasone (FLONASE) 50 MCG/ACT nasal spray Place 2 sprays into both nostrils daily. 16 g 6   glucose blood (ONETOUCH VERIO) test strip Use to check blood sugar TID. E11.69 100 each 6   insulin aspart (NOVOLOG) 100 UNIT/ML injection Inject 12 Units into the skin 3 (three) times daily before meals. For blood sugars 0-199 give 0 units of insulin, 201-250 give 4 units, 251-300 give 6 units, 301-350 give 8 units, 351-400 give 10 units,> 400 give 12 units and call M.D. (Patient taking differently: Inject 5-10 Units into the skin 3 (three) times daily before meals. For blood sugars 0-199 give 0 units of insulin, 201-250 give 4 units, 251-300 give 6 units, 301-350 give 8 units, 351-400 give 10 units,> 400 give 12 units and call M.D.) 10 mL 3   linaclotide (LINZESS) 72 MCG capsule Patient takes 1 capsule every other day.     loratadine (CLARITIN) 10 MG tablet Take 1 tablet (10 mg total) by mouth daily. 30 tablet 11   metolazone (ZAROXOLYN) 2.5 MG tablet Take 1 tablet (2.5 mg total) by mouth 2 (two) times a week. Take every Monday and Friday 90 tablet 3   metoprolol succinate (TOPROL XL) 25 MG 24 hr tablet Take 1.5 tablets (37.5 mg total) by mouth daily. 30 tablet 11   Multiple Vitamins-Minerals (MULTIVITAMIN WOMEN PO) Take 1 tablet by mouth daily.     nitroGLYCERIN (NITROSTAT) 0.4 MG SL tablet Place 1 tablet (0.4 mg total) under the tongue every 5 (five) minutes x 3 doses as needed for chest pain. 25 tablet 12   ondansetron (ZOFRAN ODT) 4 MG disintegrating tablet Take 1 tablet (4  mg total) by mouth every 8 (eight) hours as needed for nausea or vomiting. 20 tablet 0   pantoprazole (PROTONIX) 40 MG tablet Take 40 mg by mouth 2 (two) times daily.     potassium chloride SA (KLOR-CON) 20 MEQ tablet Take 3 tablets (60 mEq total) by mouth daily. Take an additional 40 meq with every dose of METOLAZONE 66 tablet 6   RESTASIS 0.05 % ophthalmic emulsion Place 1 drop into both eyes 2 (two) times daily.  12   rosuvastatin (CRESTOR) 10 MG tablet Take 1 tablet (10 mg total) by mouth daily. 90 tablet 3   torsemide (DEMADEX) 20 MG tablet Take 4 tablets (80 mg total) by mouth daily. Can take an additional 2 tablets as needed for increased weight gain 120 tablet 5   No current facility-administered medications for this encounter.   Vitals:   06/20/21 1211  BP: 110/70  Pulse: 81  SpO2: 96%  Weight: 132 kg (291 lb)   Wt Readings from Last 3 Encounters:  06/20/21 132 kg (291 lb)  05/30/21 122 kg (269 lb)  05/24/21 125.7 kg (277 lb 3.2 oz)     PHYSICAL EXAM: General:  . No resp difficulty. Walked in the clinic HEENT: normal Neck: supple. JVP 7-8. Carotids 2+ bilat; no bruits. No lymphadenopathy or thryomegaly appreciated. Cor:  PMI nondisplaced. Regular rate & rhythm. No rubs, gallops or murmurs. Lungs: clear Abdomen: obese, soft, nontender, nondistended. No hepatosplenomegaly. No bruits or masses. Good bowel sounds. Extremities: no cyanosis, clubbing, rash, edema Neuro: alert & orientedx3, cranial nerves grossly intact. moves all 4 extremities w/o difficulty. Affect pleasant  EKG: SR 1st degree heart block 81 bpm    ASSESSMENT & PLAN:  1. Chronic Systolic Heart Failure: - RHC (5/22): elevated R>L filling pressures with PAPi low, suggestive of significant component of RV dysfunction, prominent v-waves in PCWP and RA tracings suggestive of MR/TR, CO low - RHC (6/22) with low filling pressures with moderately reduced CO. - Echo (7/22) in setting of AFL with RVR, showed EF  30-35%, moderate LVH, moderate RV dysfunction, PASP 48, s/p MV repair with mild MR and mean gradient 10 mmHg with HR around 130, s/p TV repair with mild-moderate TR, dilated IVC.  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  - NYHA IIIb, confounded by obesity and deconditioning. Volume status trending up. Discussed low salt food choices.   - Increase torsemide 100 mg daily + metolazone. Mon/Frid - No spiro, arni with CKD  - GFR to low for  SGLT2i when GFR > 20.  - Check BMET today.    2. PAF - She has failed Tikosyn and amiodarone. - She had Maze in 5/22 and DCCV in 6/22 & 04/19/21, last of which she had ERAF. - Per EP, likely not candidate for PPM or AV nodal ablation. - Continue Toprol 37.5 mg daily. - Continue amiodarone 200 mg  - Continue Eliquis 5 mg bid.  - Has OSA, awaiting split night study for CPAP titration . This was set up for  06/23/21  3. CKD Stage IV   - New baseline creatinine  ~ 3  - Consider SGLT2i if GFR> 20  - Nephrology referral has been placed. She has follow up 06/29/21   4. CAD - Cath (2019): Prox LAD lesion is 10% stenosed, Dist LM to Ost LAD lesion is 10% stenosed, Ost Cx lesion is 15% stenosed - No chest pain.  - Continue statin + beta blocker - no ASA w/ Eliquis    5. Valvular Heart Disease: - S/p MV and TV repair in 5/22.  - Echo 02/25/21 with mild-moderate TR.  There is only mild MR, but mean gradient across the valve is 10 mmHg suggesting mitral stenosis   6. Obesity - Body mass index is 45.58 kg/m.   7. DMII - 01/2021 Last Hgb A1C 7.4  - Consider SGLT2i if GFR >20   8. Low Literacy Level - refer to paramedicine to help w/ med management   Follow up in 3 weeks. Check BMET today.   Areanna Gengler NP-C  12:13 PM

## 2021-06-20 NOTE — Patient Instructions (Signed)
EKG was done today  Labs were done today, if any labs are abnormal the clinic will call you   Your physician recommends that you schedule a follow-up appointment in: 3 weeks this appointment has been scheduled for you  INCREASE Torsemide to 100 mg 5 tablets daily   At the [facility], you and your health needs are our priority. As part of our continuing mission to provide you with exceptional heart care, we have created designated Provider Care Teams. These Care Teams include your primary Cardiologist (physician) and Advanced Practice Providers (APPs- Physician Assistants and Nurse Practitioners) who all work together to provide you with the care you need, when you need it.   You may see any of the following providers on your designated Care Team at your next follow up: Dr Glori Bickers Dr Haynes Kerns, NP Lyda Jester, [place] [facility] [place], [place] Audry Riles, PharmD   Please be sure to bring in all your medications bottles to every appointment.   If you have any questions or concerns before your next appointment please send [place] a message through [place] or call our office at [phone].    TO LEAVE A MESSAGE FOR THE NURSE SELECT OPTION 2, PLEASE LEAVE A MESSAGE INCLUDING: YOUR NAME DATE OF BIRTH CALL BACK NUMBER REASON FOR CALL**this is important as we prioritize the call backs  YOU WILL RECEIVE A CALL BACK THE SAME DAY AS LONG AS YOU CALL BEFORE 4:00 PM

## 2021-06-20 NOTE — Progress Notes (Signed)
Paramedicine Encounter    Patient ID: [name], female    DOB: 12-11-1955, 65 y.o.   MRN: [id number]  Met with [name] in clinic today where she was seen by Leslie Grinder NP.   [name] reports taking medications and having no trouble picking up medications at her pharmacy. [name] talked with [name] about the importance of minimizing her fluid and salt intake as she has a 20lbs weight gain in office today since last clinic visit. [name] states she doesn't eat or drink much.   Legs had minimal swelling today. [name] denied shortness of breath but complains of right neck, shoulder, arm and pectoral pain. EKG was obtained and was un-remarkable per [name]. I will reach out to [name] reference same. [name] is scheduled for sleep study 11/3 and [name] follow up on 11/9 at 1000.   I plan on seeing patient in the home Monday 11/7 at 1000.   Patient expressed issues with depression and accepting to help, I will reach out to [place] with [place].   We reviewed medications and I labeled same for assistance when taking medications.   Clinic drew labs and provided with appointment list. Visit complete.     ACTION: Home visit completed

## 2021-06-21 ENCOUNTER — Emergency Department (HOSPITAL_COMMUNITY)
Admission: EM | Admit: 2021-06-21 | Discharge: 2021-06-21 | Disposition: A | Discharge status: Home / Self Care | Payer: Medicare Other | Attending: Emergency Medicine | Admitting: Emergency Medicine

## 2021-06-21 ENCOUNTER — Telehealth (HOSPITAL_COMMUNITY): Payer: Self-pay | Admitting: Surgery

## 2021-06-21 ENCOUNTER — Emergency Department (HOSPITAL_COMMUNITY): Payer: Medicare Other

## 2021-06-21 DIAGNOSIS — J449 Chronic obstructive pulmonary disease, unspecified: Secondary | ICD-10-CM | POA: Insufficient documentation

## 2021-06-21 DIAGNOSIS — S199XXA Unspecified injury of neck, initial encounter: Secondary | ICD-10-CM | POA: Insufficient documentation

## 2021-06-21 DIAGNOSIS — E1110 Type 2 diabetes mellitus with ketoacidosis without coma: Secondary | ICD-10-CM | POA: Insufficient documentation

## 2021-06-21 DIAGNOSIS — R0789 Other chest pain: Secondary | ICD-10-CM | POA: Insufficient documentation

## 2021-06-21 DIAGNOSIS — J45909 Unspecified asthma, uncomplicated: Secondary | ICD-10-CM | POA: Insufficient documentation

## 2021-06-21 DIAGNOSIS — Z79899 Other long term (current) drug therapy: Secondary | ICD-10-CM | POA: Diagnosis not present

## 2021-06-21 DIAGNOSIS — S0990XA Unspecified injury of head, initial encounter: Secondary | ICD-10-CM | POA: Diagnosis present

## 2021-06-21 DIAGNOSIS — Y92410 Unspecified street and highway as the place of occurrence of the external cause: Secondary | ICD-10-CM | POA: Insufficient documentation

## 2021-06-21 DIAGNOSIS — N189 Chronic kidney disease, unspecified: Secondary | ICD-10-CM | POA: Diagnosis not present

## 2021-06-21 DIAGNOSIS — N9489 Other specified conditions associated with female genital organs and menstrual cycle: Secondary | ICD-10-CM | POA: Insufficient documentation

## 2021-06-21 DIAGNOSIS — I130 Hypertensive heart and chronic kidney disease with heart failure and stage 1 through stage 4 chronic kidney disease, or unspecified chronic kidney disease: Secondary | ICD-10-CM | POA: Diagnosis not present

## 2021-06-21 DIAGNOSIS — Z955 Presence of coronary angioplasty implant and graft: Secondary | ICD-10-CM | POA: Diagnosis not present

## 2021-06-21 DIAGNOSIS — I5042 Chronic combined systolic (congestive) and diastolic (congestive) heart failure: Secondary | ICD-10-CM | POA: Insufficient documentation

## 2021-06-21 DIAGNOSIS — Z7901 Long term (current) use of anticoagulants: Secondary | ICD-10-CM | POA: Diagnosis not present

## 2021-06-21 DIAGNOSIS — I4891 Unspecified atrial fibrillation: Secondary | ICD-10-CM | POA: Insufficient documentation

## 2021-06-21 DIAGNOSIS — Z794 Long term (current) use of insulin: Secondary | ICD-10-CM | POA: Diagnosis not present

## 2021-06-21 DIAGNOSIS — R079 Chest pain, unspecified: Secondary | ICD-10-CM

## 2021-06-21 LAB — I-STAT CHEM 8, ED
BUN: 40 mg/dL — ABNORMAL HIGH (ref 8–23)
Calcium, Ion: 1.15 mmol/L (ref 1.15–1.40)
Chloride: 102 mmol/L (ref 98–111)
Creatinine, Ser: 2.7 mg/dL — ABNORMAL HIGH (ref 0.44–1.00)
Glucose, Bld: 122 mg/dL — ABNORMAL HIGH (ref 70–99)
HCT: 34 % — ABNORMAL LOW (ref 36.0–46.0)
Hemoglobin: 11.6 g/dL — ABNORMAL LOW (ref 12.0–15.0)
Potassium: 3.6 mmol/L (ref 3.5–5.1)
Sodium: 141 mmol/L (ref 135–145)
TCO2: 30 mmol/L (ref 22–32)

## 2021-06-21 LAB — CBC WITH DIFFERENTIAL/PLATELET
Abs Immature Granulocytes: 0.01 10*3/uL (ref 0.00–0.07)
Basophils Absolute: 0 10*3/uL (ref 0.0–0.1)
Basophils Relative: 1 %
Eosinophils Absolute: 0.2 10*3/uL (ref 0.0–0.5)
Eosinophils Relative: 4 %
HCT: 33.9 % — ABNORMAL LOW (ref 36.0–46.0)
Hemoglobin: 10.7 g/dL — ABNORMAL LOW (ref 12.0–15.0)
Immature Granulocytes: 0 %
Lymphocytes Relative: 27 %
Lymphs Abs: 1.6 10*3/uL (ref 0.7–4.0)
MCH: 28.6 pg (ref 26.0–34.0)
MCHC: 31.6 g/dL (ref 30.0–36.0)
MCV: 90.6 fL (ref 80.0–100.0)
Monocytes Absolute: 1.1 10*3/uL — ABNORMAL HIGH (ref 0.1–1.0)
Monocytes Relative: 17 %
Neutro Abs: 3.1 10*3/uL (ref 1.7–7.7)
Neutrophils Relative %: 51 %
Platelets: 185 10*3/uL (ref 150–400)
RBC: 3.74 MIL/uL — ABNORMAL LOW (ref 3.87–5.11)
RDW: 14.7 % (ref 11.5–15.5)
WBC: 6.1 10*3/uL (ref 4.0–10.5)
nRBC: 0 % (ref 0.0–0.2)

## 2021-06-21 LAB — BASIC METABOLIC PANEL
Anion gap: 8 (ref 5–15)
BUN: 39 mg/dL — ABNORMAL HIGH (ref 8–23)
CO2: 30 mmol/L (ref 22–32)
Calcium: 9.1 mg/dL (ref 8.9–10.3)
Chloride: 100 mmol/L (ref 98–111)
Creatinine, Ser: 2.64 mg/dL — ABNORMAL HIGH (ref 0.44–1.00)
GFR, Estimated: 20 mL/min — ABNORMAL LOW (ref >60)
Glucose, Bld: 127 mg/dL — ABNORMAL HIGH (ref 70–99)
Potassium: 3.6 mmol/L (ref 3.5–5.1)
Sodium: 138 mmol/L (ref 135–145)

## 2021-06-21 MED ORDER — HYDROCODONE-ACETAMINOPHEN 5-325 MG PO TABS: 1.0000 | ORAL_TABLET | Freq: Four times a day (QID) | ORAL | 0 refills | Status: DC | PRN

## 2021-06-21 MED ORDER — MORPHINE SULFATE (PF) 2 MG/ML IV SOLN
2.0000 mg | Freq: Once | INTRAVENOUS | Status: AC
  Administered 2021-06-21: 2 mg via INTRAVENOUS
  Filled 2021-06-21: qty 1

## 2021-06-21 NOTE — ED Notes (Signed)
C-collar removed by Francia Greaves MD
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
## 2021-06-21 NOTE — ED Notes (Signed)
Patient transported to X-ray 
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
## 2021-06-21 NOTE — Discharge Instructions (Addendum)
Return for any problem.  ?

## 2021-06-21 NOTE — ED Notes (Signed)
MD at bedside. 

## 2021-06-23 ENCOUNTER — Encounter (HOSPITAL_BASED_OUTPATIENT_CLINIC_OR_DEPARTMENT_OTHER): Payer: Medicare Other | Admitting: Internal Medicine

## 2021-06-28 ENCOUNTER — Other Ambulatory Visit (HOSPITAL_COMMUNITY): Payer: Self-pay

## 2021-06-28 ENCOUNTER — Telehealth: Payer: Self-pay
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
## 2021-06-29 NOTE — Telephone Encounter (Signed)
This morning, this CM shared photo of patient's bruise with Juluis Mire, NP who has attempted to contact patient.

## 2021-06-29 NOTE — Telephone Encounter (Signed)
Called and left her a message. Left yesterday the same way I sent her out on her previous visit. She will call me back

## 2021-07-01 ENCOUNTER — Encounter (HOSPITAL_BASED_OUTPATIENT_CLINIC_OR_DEPARTMENT_OTHER): Payer: Self-pay | Admitting: Cardiology

## 2021-07-01 ENCOUNTER — Ambulatory Visit (INDEPENDENT_AMBULATORY_CARE_PROVIDER_SITE_OTHER): Payer: Medicare Other | Admitting: Cardiology

## 2021-07-01 ENCOUNTER — Ambulatory Visit (INDEPENDENT_AMBULATORY_CARE_PROVIDER_SITE_OTHER): Payer: Medicare Other

## 2021-07-01 ENCOUNTER — Other Ambulatory Visit: Payer: Self-pay

## 2021-07-01 ENCOUNTER — Ambulatory Visit: Payer: Medicare Other | Admitting: Internal Medicine

## 2021-07-01 VITALS — BP 144/82 | HR 84 | Ht 67.0 in | Wt 289.4 lb

## 2021-07-01 DIAGNOSIS — Z9889 Other specified postprocedural states: Secondary | ICD-10-CM

## 2021-07-01 DIAGNOSIS — N184 Chronic kidney disease, stage 4 (severe): Secondary | ICD-10-CM

## 2021-07-01 DIAGNOSIS — I82721 Chronic embolism and thrombosis of deep veins of right upper extremity: Secondary | ICD-10-CM

## 2021-07-01 DIAGNOSIS — I10 Essential (primary) hypertension: Secondary | ICD-10-CM

## 2021-07-01 DIAGNOSIS — I5042 Chronic combined systolic (congestive) and diastolic (congestive) heart failure: Secondary | ICD-10-CM

## 2021-07-01 DIAGNOSIS — I480 Paroxysmal atrial fibrillation: Secondary | ICD-10-CM
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
## 2021-07-01 NOTE — Patient Instructions (Addendum)
Medication Instructions:  Your Physician recommend you continue on your current medication as directed.    *If you need a refill on your cardiac medications before your next appointment, please call your pharmacy*   Lab Work: None ordered    Testing/Procedures: Your physician has requested that you have a upper extremity venous duplex. This test is an ultrasound of the veins in the legs or arms. It looks at venous blood flow that carries blood from the heart to the legs or arms. Allow one hour for a Lower Venous exam. Allow thirty minutes for an Upper Venous exam. There are no restrictions or special instructions.     Follow-Up: At [facility], you and your health needs are our priority.  As part of our continuing mission to provide you with exceptional heart care, we have created designated Provider Care Teams.  These Care Teams include your primary Cardiologist (physician) and Advanced Practice Providers (APPs -  Physician Assistants and Nurse Practitioners) who all work together to provide you with the care you need, when you need it.  We recommend signing up for the patient portal called "MyChart".  Sign up information is provided on this After Visit Summary.  MyChart is used to connect with patients for Virtual Visits (Telemedicine).  Patients are able to view lab/test results, encounter notes, upcoming appointments, etc.  Non-urgent messages can be sent to your provider as well.   To learn more about what you can do with MyChart, go to [email or web address].    Your next appointment:   3 month(s)  The format for your next appointment:   In Person  Provider:   Buford Dresser, MD

## 2021-07-04 ENCOUNTER — Other Ambulatory Visit (INDEPENDENT_AMBULATORY_CARE_PROVIDER_SITE_OTHER): Payer: Self-pay | Admitting: Primary Care

## 2021-07-04 ENCOUNTER — Telehealth (HOSPITAL_COMMUNITY): Payer: Self-pay

## 2021-07-04 DIAGNOSIS — F4323 Adjustment disorder with mixed anxiety and depressed mood: Secondary | ICD-10-CM

## 2021-07-04 MED ORDER — BUSPIRONE HCL 5 MG PO TABS: 5.0000 mg | ORAL_TABLET | Freq: Two times a day (BID) | ORAL | 1 refills | Status: DC

## 2021-07-04 NOTE — Telephone Encounter (Signed)
Called in refills for [name] and [facility] reports she needs refills on the following sent in by PCP.   -Alprazolam -Pantoprazole.   Potassium is refillable and will be ready today.

## 2021-07-04 NOTE — Telephone Encounter (Signed)
Sent to PCP ?

## 2021-07-05 ENCOUNTER — Ambulatory Visit (HOSPITAL_BASED_OUTPATIENT_CLINIC_OR_DEPARTMENT_OTHER): Payer: Medicare Other | Admitting: Cardiology

## 2021-07-06 ENCOUNTER — Other Ambulatory Visit (INDEPENDENT_AMBULATORY_CARE_PROVIDER_SITE_OTHER): Payer: Self-pay | Admitting: Primary Care

## 2021-07-06 ENCOUNTER — Other Ambulatory Visit (HOSPITAL_COMMUNITY): Payer: Self-pay

## 2021-07-06 ENCOUNTER — Telehealth (HOSPITAL_COMMUNITY): Payer: Self-pay

## 2021-07-06 MED ORDER — BLOOD GLUCOSE METER KIT: PACK | 0 refills | Status: DC

## 2021-07-06 MED ORDER — BD SWAB SINGLE USE REGULAR PADS
1.0000 | MEDICATED_PAD | Freq: Every day | 11 refills | Status: DC
Start: 2021-07-06 — End: 2022-03-09

## 2021-07-06 NOTE — Telephone Encounter (Signed)
Sent to PCP ?

## 2021-07-06 NOTE — Progress Notes (Signed)
Paramedicine Encounter    Patient ID: [name], female    DOB: Sep 23, 1955, 65 y.o.   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   Acute exacerbation of CHF (congestive heart failure) ([place]) 01/29/2021   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   Encounter for preoperative dental examination    Teeth missing    Gingivitis    Accretions on teeth    CHF exacerbation ([place]) 12/21/2020   Acute on chronic heart failure ([place]) 12/19/2020   Atrial fibrillation, permanent ([place]) 11/02/2020   Chronic combined systolic and diastolic heart failure ([place]) 05/06/2020   NICM (nonischemic cardiomyopathy) ([place]) 04/01/2020   Family history of heart disease 04/01/2020   Mitral regurgitation 04/01/2020   Severe tricuspid regurgitation 04/01/2020   Closed right ankle fracture 09/24/2019   DKA, type 2 ([place]) 09/24/2019   [place] (hyperglycemic hyperosmolar nonketotic coma) ([place]) 02/21/2019   Hypokalemia 02/21/2019   Acute kidney injury superimposed on CKD ([place]) 02/21/2019   Acute on chronic left systolic heart failure ([place]) 07/01/2018   Congestive heart failure with left ventricular diastolic dysfunction, acute ([place]) 07/01/2018   Right upper lobe pneumonia 05/14/2018   Atrial fibrillation with RVR ([place]) 05/14/2018   Chronic atrial fibrillation ([place]) 32/99/2426   Diastolic dysfunction 83/41/9622   Diabetes mellitus type 2 in obese ([place]) 12/26/2017   Nausea and vomiting    Chest pain 02/26/2012   OSA (obstructive sleep  apnea) 09/21/2011   Hypoxia 09/21/2011   Diabetes mellitus ([place]) 02/04/2010   Morbid obesity ([place]) 02/04/2010   AF (paroxysmal atrial fibrillation) ([place]) 02/04/2010   Gastroparesis 02/04/2010   Acute gastroenteritis 02/04/2010    Current Outpatient Medications:    albuterol (VENTOLIN HFA) 108 (90 Base) MCG/ACT inhaler, Inhale 2 puffs into the lungs every 4 (four) hours as needed (Asthma)., Disp: , Rfl:    Alcohol Swabs (B-D SINGLE USE SWABS REGULAR) PADS, 1 each by Other route daily., Disp: , Rfl:    ALPRAZolam (XANAX) 0.5 MG tablet, Take 1 tablet (0.5 mg total) by mouth 2 (two) times daily as needed for anxiety., Disp: 30 tablet, Rfl: 0   amiodarone (PACERONE) 200 MG tablet, Take 200 mg by mouth daily., Disp: , Rfl:    apixaban (ELIQUIS) 5 MG TABS tablet, Take 1 tablet (5 mg total) by mouth 2 (two) times daily., Disp: 180 tablet, Rfl: 3   B-D ULTRAFINE III SHORT PEN 31G X 8 MM MISC, 1 each by Other route in the morning, at noon, in the evening, and at bedtime., Disp: , Rfl:    blood glucose meter kit and supplies KIT, Dispense based on patient and insurance preference. Use up to four times daily as directed., Disp: 1 each, Rfl: 0   Blood Glucose Monitoring Suppl (ACCU-CHEK GUIDE ME) w/Device KIT, Use as instructed to check blood sugar three times daily. E11.69, Disp: 1 kit, Rfl: 0   Blood Glucose Monitoring Suppl (ONETOUCH VERIO) w/Device KIT, Use to check blood sugar TID. E11.69, Disp: 1 kit, Rfl: 0   busPIRone (BUSPAR) 5 MG tablet, Take 1  tablet (5 mg total) by mouth 2 (two) times daily., Disp: 60 tablet, Rfl: 1   colchicine 0.6 MG tablet, Taking 1/2 tablet by mouth daily, Disp: , Rfl:    docusate sodium (COLACE) 250 MG capsule, Take 100 mg by mouth daily., Disp: , Rfl:    Dulaglutide (TRULICITY) 1.5 YY/5.1TM SOPN, Inject 1.5 mg into the skin once a week., Disp: 0.5 mL, Rfl: 3   DULoxetine (CYMBALTA) 60 MG capsule, Take 1 capsule (60 mg total) by mouth daily., Disp: 90 capsule, Rfl: 3    EPINEPHrine 0.3 mg/0.3 mL IJ SOAJ injection, Inject 0.3 mg into the muscle as needed for anaphylaxis., Disp: 1 each, Rfl: 1   fluticasone (FLONASE) 50 MCG/ACT nasal spray, Place 2 sprays into both nostrils daily., Disp: 16 g, Rfl: 6   glucose blood (ONETOUCH VERIO) test strip, Use to check blood sugar TID. E11.69, Disp: 100 each, Rfl: 6   HYDROcodone-acetaminophen (NORCO/VICODIN) 5-325 MG tablet, Take 1 tablet by mouth every 6 (six) hours as needed., Disp: 8 tablet, Rfl: 0   insulin aspart (NOVOLOG) 100 UNIT/ML injection, Inject 12 Units into the skin 3 (three) times daily before meals. For blood sugars 0-199 give 0 units of insulin, 201-250 give 4 units, 251-300 give 6 units, 301-350 give 8 units, 351-400 give 10 units,> 400 give 12 units and call M.D. (Patient taking differently: Inject 5-10 Units into the skin 3 (three) times daily before meals. For blood sugars 0-199 give 0 units of insulin, 201-250 give 4 units, 251-300 give 6 units, 301-350 give 8 units, 351-400 give 10 units,> 400 give 12 units and call M.D.), Disp: 10 mL, Rfl: 3   linaclotide (LINZESS) 72 MCG capsule, Patient takes 1 capsule every other day., Disp: , Rfl:    loratadine (CLARITIN) 10 MG tablet, Take 1 tablet (10 mg total) by mouth daily., Disp: 30 tablet, Rfl: 11   metolazone (ZAROXOLYN) 2.5 MG tablet, Take 1 tablet (2.5 mg total) by mouth 2 (two) times a week. Take every Monday and Friday, Disp: 90 tablet, Rfl: 3   metoprolol succinate (TOPROL XL) 25 MG 24 hr tablet, Take 1.5 tablets (37.5 mg total) by mouth daily., Disp: 30 tablet, Rfl: 11   Multiple Vitamins-Minerals (MULTIVITAMIN WOMEN PO), Take 1 tablet by mouth daily., Disp: , Rfl:    nitroGLYCERIN (NITROSTAT) 0.4 MG SL tablet, Place 1 tablet (0.4 mg total) under the tongue every 5 (five) minutes x 3 doses as needed for chest pain., Disp: 25 tablet, Rfl: 12   ondansetron (ZOFRAN ODT) 4 MG disintegrating tablet, Take 1 tablet (4 mg total) by mouth every 8 (eight) hours as  needed for nausea or vomiting., Disp: 20 tablet, Rfl: 0   pantoprazole (PROTONIX) 40 MG tablet, Take 40 mg by mouth 2 (two) times daily., Disp: , Rfl:    potassium chloride SA (KLOR-CON) 20 MEQ tablet, Take 3 tablets (60 mEq total) by mouth daily. Take an additional 40 meq with every dose of METOLAZONE, Disp: 66 tablet, Rfl: 6   RESTASIS 0.05 % ophthalmic emulsion, Place 1 drop into both eyes 2 (two) times daily., Disp: , Rfl: 12   rosuvastatin (CRESTOR) 10 MG tablet, Take 1 tablet (10 mg total) by mouth daily., Disp: 90 tablet, Rfl: 3   torsemide (DEMADEX) 20 MG tablet, Take 5 tablets (100 mg total) by mouth daily. Can take an additional 2 tablets as needed for increased weight gain, Disp: 150 tablet, Rfl: 4 Allergies  Allergen Reactions   Sulfa Antibiotics Itching  Social History   Socioeconomic History   Marital status: Significant Other    Spouse name: Not on file   Number of children: 2   Years of education: Not on file   Highest education level: Some college, no degree  Occupational History   Not on file  Tobacco Use   Smoking status: Never   Smokeless tobacco: Never  Vaping Use   Vaping Use: Never used  Substance and Sexual Activity   Alcohol use: No   Drug use: No   Sexual activity: Not Currently  Other Topics Concern   Not on file  Social History Narrative   Pt lives in [place] with spouse.  2 grown children.   Previously worked in [employer] at [employer].  Now on disability   Social Determinants of Health   Financial Resource Strain: Low Risk    Difficulty of Paying Living Expenses: Not very hard  Food Insecurity: No Food Insecurity   Worried About Charity fundraiser in the Last Year: Never true   Ran Out of Food in the Last Year: Never true  Transportation Needs: No Transportation Needs   Lack of Transportation (Medical): No   Lack of Transportation (Non-Medical): No  Physical Activity: Not on file  Stress: Not on file  Social Connections: Not on file   Intimate Partner Violence: Not on file    Physical Exam Vitals reviewed.  Constitutional:      Appearance: Normal appearance. She is normal weight.  HENT:     Head: Normocephalic.     Nose: Nose normal.     Mouth/Throat:     Mouth: Mucous membranes are moist.     Pharynx: Oropharynx is clear.  Eyes:     Conjunctiva/sclera: Conjunctivae normal.     Pupils: Pupils are equal, round, and reactive to light.  Cardiovascular:     Rate and Rhythm: Normal rate and regular rhythm.     Pulses: Normal pulses.     Heart sounds: Normal heart sounds.  Pulmonary:     Effort: Pulmonary effort is normal.     Breath sounds: Normal breath sounds.  Abdominal:     General: Abdomen is flat.     Palpations: Abdomen is soft.  Musculoskeletal:        General: Swelling present. Normal range of motion.     Cervical back: Normal range of motion.     Right lower leg: No edema.     Left lower leg: No edema.  Skin:    General: Skin is warm and dry.     Capillary Refill: Capillary refill takes less than 2 seconds.  Neurological:     General: No focal deficit present.     Mental Status: She is alert. Mental status is at baseline.  Psychiatric:        Mood and Affect: Mood normal.    Arrived for home visit with Hoyle Sauer who reports she is feeling great. She denied shortness of breath, chest pain, dizziness this week. She was compliant with her medications this week. I obtained vitals and assessment as noted. No swelling noted. Lung sounds clear. Medications reviewed and confirmed, one week of medications filled in pill box. Dietary management discussed. She currently is not checking her sugar due to her not having a working glucometer. I will message Dr. Oletta Lamas about same. Rolando reported that she is wanting to explore options with a psychiatrist. I will message Asante LCSW with [facility].  She advised she spoke to Dr. Harrell Gave about same,  I will coordinate some resources for her. We reviewed appointments  and confirmed same. Home visit complete. I will see Tyshia in one week in clinic.   Refills: Pantoprazole Glucometer meter/lancets     Future Appointments  Date Time Provider [place]  07/13/2021  8:30 AM [facility] PA/NP [facility] None  07/27/2021  8:30 AM [name], NP [facility] None  08/24/2021  9:50 AM [name], NP [facility] None  10/03/2021  9:00 AM Buford Dresser, MD [facility]     ACTION: Home visit completed

## 2021-07-06 NOTE — Telephone Encounter (Signed)
Spoke to patient who reports she isn't checking her sugar due to her meter being broken and needing a new one. I will send message to PCP in regards to needing RX for Glucometer and supplies sent to [facility] for patient. She also is needing RX for Protonix in which she has been without for several weeks. All other medications verified and confirmed. No other needs at this time. Call complete.

## 2021-07-07 NOTE — Telephone Encounter (Signed)
It looks like [facility] sent supplies and glucometer 07/06/2021. Let me know if these were not picked up by the patient or if I need to send something different for insurance preference.
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
## 2021-07-12 ENCOUNTER — Telehealth (HOSPITAL_COMMUNITY): Payer: Self-pay

## 2021-07-12 NOTE — Telephone Encounter (Signed)
Called to confirm/remind patient of their appointment at the [facility] on 07/13/21.   Patient reminded to bring all medications and/or complete list.  Confirmed patient has transportation. Gave directions, instructed to utilize [facility] parking.  Confirmed appointment prior to ending call.

## 2021-07-12 NOTE — Telephone Encounter (Signed)
Attempted to help [name] with rescheduling her sleep study however they report per sleep lab wrong order is in the system and they can not move forward until fixed. Please assist if possible.  -Call routed to PCP [facility]  Sleep Lab [phone]

## 2021-07-13 ENCOUNTER — Encounter (HOSPITAL_COMMUNITY): Payer: Self-pay

## 2021-07-13 ENCOUNTER — Other Ambulatory Visit (INDEPENDENT_AMBULATORY_CARE_PROVIDER_SITE_OTHER): Payer: Self-pay | Admitting: Primary Care

## 2021-07-13 ENCOUNTER — Other Ambulatory Visit: Payer: Self-pay | Admitting: Pharmacist

## 2021-07-13 ENCOUNTER — Ambulatory Visit (HOSPITAL_COMMUNITY)
Admission: RE | Admit: 2021-07-13 | Discharge: 2021-07-13 | Disposition: A | Discharge status: Home / Self Care | Payer: Medicare Other | Source: Ambulatory Visit | Attending: Family Medicine | Admitting: Family Medicine

## 2021-07-13 ENCOUNTER — Other Ambulatory Visit: Payer: Self-pay

## 2021-07-13 ENCOUNTER — Telehealth (HOSPITAL_COMMUNITY): Payer: Self-pay | Admitting: Surgery

## 2021-07-13 ENCOUNTER — Other Ambulatory Visit (HOSPITAL_COMMUNITY): Payer: Self-pay

## 2021-07-13 VITALS — BP 130/82 | HR 93 | Wt 282.0 lb

## 2021-07-13 DIAGNOSIS — I5022 Chronic systolic (congestive) heart failure: Secondary | ICD-10-CM

## 2021-07-13 DIAGNOSIS — I2510 Atherosclerotic heart disease of native coronary artery without angina pectoris: Secondary | ICD-10-CM | POA: Diagnosis not present

## 2021-07-13 DIAGNOSIS — I130 Hypertensive heart and chronic kidney disease with heart failure and stage 1 through stage 4 chronic kidney disease, or unspecified chronic kidney disease: Secondary | ICD-10-CM | POA: Diagnosis not present

## 2021-07-13 DIAGNOSIS — Z79899 Other long term (current) drug therapy: Secondary | ICD-10-CM | POA: Diagnosis not present

## 2021-07-13 DIAGNOSIS — Z6841 Body Mass Index (BMI) 40.0 and over, adult: Secondary | ICD-10-CM | POA: Diagnosis not present

## 2021-07-13 DIAGNOSIS — E1122 Type 2 diabetes mellitus with diabetic chronic kidney disease: Secondary | ICD-10-CM | POA: Insufficient documentation

## 2021-07-13 DIAGNOSIS — I480 Paroxysmal atrial fibrillation: Secondary | ICD-10-CM | POA: Insufficient documentation

## 2021-07-13 DIAGNOSIS — Z550 Illiteracy and low-level literacy: Secondary | ICD-10-CM | POA: Insufficient documentation

## 2021-07-13 DIAGNOSIS — G4733 Obstructive sleep apnea (adult) (pediatric): Secondary | ICD-10-CM

## 2021-07-13 DIAGNOSIS — I4892 Unspecified atrial flutter: Secondary | ICD-10-CM | POA: Diagnosis not present

## 2021-07-13 DIAGNOSIS — N184 Chronic kidney disease, stage 4 (severe): Secondary | ICD-10-CM | POA: Diagnosis not present

## 2021-07-13 DIAGNOSIS — R0902 Hypoxemia: Secondary | ICD-10-CM

## 2021-07-13 LAB — COMPREHENSIVE METABOLIC PANEL
ALT: 32 U/L (ref 0–44)
AST: 33 U/L (ref 15–41)
Albumin: 3.7 g/dL (ref 3.5–5.0)
Alkaline Phosphatase: 172 U/L — ABNORMAL HIGH (ref 38–126)
Anion gap: 11 (ref 5–15)
BUN: 45 mg/dL — ABNORMAL HIGH (ref 8–23)
CO2: 36 mmol/L — ABNORMAL HIGH (ref 22–32)
Calcium: 9.8 mg/dL (ref 8.9–10.3)
Chloride: 93 mmol/L — ABNORMAL LOW (ref 98–111)
Creatinine, Ser: 3.12 mg/dL — ABNORMAL HIGH (ref 0.44–1.00)
GFR, Estimated: 16 mL/min — ABNORMAL LOW (ref >60)
Glucose, Bld: 142 mg/dL — ABNORMAL HIGH (ref 70–99)
Potassium: 3.2 mmol/L — ABNORMAL LOW (ref 3.5–5.1)
Sodium: 140 mmol/L (ref 135–145)
Total Bilirubin: 0.7 mg/dL (ref 0.3–1.2)
Total Protein: 8.5 g/dL — ABNORMAL HIGH (ref 6.5–8.1)

## 2021-07-13 LAB — TSH: TSH: 2.666 u[IU]/mL (ref 0.350–4.500)

## 2021-07-13 MED ORDER — ACCU-CHEK GUIDE VI STRP: ORAL_STRIP | 3 refills | Status: DC

## 2021-07-13 MED ORDER — POTASSIUM CHLORIDE CRYS ER 20 MEQ PO TBCR
80.0000 meq | EXTENDED_RELEASE_TABLET | Freq: Every day | ORAL | 6 refills | Status: DC
Start: 2021-07-13 — End: 2021-09-02

## 2021-07-13 NOTE — Telephone Encounter (Signed)
-----   Message from Joette Catching, [place] sent at 07/13/2021  1:43 PM EST ----- Scr slightly above recent baseline, but GFR stable. K low at 3.2. Need to increase K supplement from 60 mEQ to 80 mEQ daily. Needs BMET in 1 week

## 2021-07-13 NOTE — Patient Instructions (Signed)
Please increase Amiodarone to 200 mg twice daily. Referral sent to Electrophysiology.  They will contact you for an appt.  Return in 6 weeks per appt.

## 2021-07-13 NOTE — Progress Notes (Signed)
Paramedicine Encounter    Patient ID: [name], female    DOB: [birth year], 65 y.o.   MRN: [id number]  Met with [name] in clinic today who was complaining of chest pain since yesterday around 0900 with not much relief using her nitroglycerin. PA ordered Labs and EKG today. EKG noted a.flutter. PA [name] referred to Dr. Haroldine Gallagher and they agreed to increase Amiodarone to 241m BID and to have the patient seen in EP clinic ASAP. [name] wanted patient to stop using Zofran due to prolonged QT. Potassium low today and was increased from 60MEQ to 8[place] Patient was made aware of this by RN in clinic. I plan to see patient in one week in the home and we will repeat EKG at that time. Clinic visit complete.   Refills: Eliquis (called in 1600 on 11/23)   [place] she [place] medicare and medicaid. I advised her to be sure to show the pharmacy her medicaid card as well to ensure she is getting correct pricing on he medications. She agreed.   ACTION: Home visit completed

## 2021-07-13 NOTE — Telephone Encounter (Signed)
Patient made aware of results and recommendations per provider.  I have updated medication list and scheduled follow-up labwork appt.

## 2021-07-20 ENCOUNTER — Ambulatory Visit (HOSPITAL_COMMUNITY)
Admission: RE | Admit: 2021-07-20 | Discharge: 2021-07-20 | Disposition: A | Discharge status: Home / Self Care | Payer: Medicare Other | Source: Ambulatory Visit | Attending: Cardiology | Admitting: Cardiology

## 2021-07-20 ENCOUNTER — Telehealth (HOSPITAL_COMMUNITY): Payer: Self-pay

## 2021-07-20 ENCOUNTER — Other Ambulatory Visit (HOSPITAL_COMMUNITY): Payer: Self-pay

## 2021-07-20 ENCOUNTER — Other Ambulatory Visit: Payer: Self-pay

## 2021-07-20 DIAGNOSIS — I5022 Chronic systolic (congestive) heart failure: Secondary | ICD-10-CM

## 2021-07-20 LAB — BASIC METABOLIC PANEL
Anion gap: 7 (ref 5–15)
BUN: 49 mg/dL — ABNORMAL HIGH (ref 8–23)
CO2: 31 mmol/L (ref 22–32)
Calcium: 9.5 mg/dL (ref 8.9–10.3)
Chloride: 102 mmol/L (ref 98–111)
Creatinine, Ser: 3.08 mg/dL — ABNORMAL HIGH (ref 0.44–1.00)
GFR, Estimated: 16 mL/min — ABNORMAL LOW (ref >60)
Glucose, Bld: 151 mg/dL — ABNORMAL HIGH (ref 70–99)
Potassium: 3.8 mmol/L (ref 3.5–5.1)
Sodium: 140 mmol/L (ref 135–145)

## 2021-07-20 NOTE — Telephone Encounter (Signed)
Patient advised and verbalized understanding. Lab appt scheduled, lab order entered.   Orders Placed This Encounter  Procedures   Basic metabolic panel    Standing Status:   Future    Standing Expiration Date:   07/20/2022    Order Specific Question:   Release to patient    Answer:   Immediate  .

## 2021-07-20 NOTE — Progress Notes (Signed)
Paramedicine Encounter    Patient ID: [name], female    DOB: [birth year], 65 y.o.   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   Acute exacerbation of CHF (congestive heart failure) ([place]) 01/29/2021   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   Encounter for preoperative dental examination    Teeth missing    Gingivitis    Accretions on teeth    CHF exacerbation ([place]) 12/21/2020   Acute on chronic heart failure ([place]) 12/19/2020   Atrial fibrillation, permanent ([place]) 11/02/2020   Chronic combined systolic and diastolic heart failure ([place]) 05/06/2020   NICM (nonischemic cardiomyopathy) ([place]) 04/01/2020   Family history of heart disease 04/01/2020   Mitral regurgitation 04/01/2020   Severe tricuspid regurgitation 04/01/2020   Closed right ankle fracture 09/24/2019   DKA, type 2 ([place]) 09/24/2019   [place] (hyperglycemic hyperosmolar nonketotic coma) ([place]) 02/21/2019   Hypokalemia 02/21/2019   Acute kidney injury superimposed on CKD ([place]) 02/21/2019   Acute on chronic left systolic heart failure ([place]) 07/01/2018   Congestive heart failure with left ventricular diastolic dysfunction, acute ([place]) 07/01/2018   Right upper lobe pneumonia 05/14/2018   Atrial fibrillation with RVR ([place]) 05/14/2018   Chronic atrial fibrillation ([place]) 26/37/8588   Diastolic dysfunction 50/27/7412   Diabetes mellitus type 2 in obese ([place]) 12/26/2017   Nausea and vomiting    Chest pain 02/26/2012   OSA (obstructive sleep  apnea) 09/21/2011   Hypoxia 09/21/2011   Diabetes mellitus ([place]) 02/04/2010   Morbid obesity ([place]) 02/04/2010   AF (paroxysmal atrial fibrillation) ([place]) 02/04/2010   Gastroparesis 02/04/2010   Acute gastroenteritis 02/04/2010    Current Outpatient Medications:    albuterol (VENTOLIN HFA) 108 (90 Base) MCG/ACT inhaler, Inhale 2 puffs into the lungs every 4 (four) hours as needed (Asthma)., Disp: , Rfl:    Alcohol Swabs (B-D SINGLE USE SWABS REGULAR) PADS, 1 each by Other route daily., Disp: 100 each, Rfl: 11   ALPRAZolam (XANAX) 0.5 MG tablet, Take 1 tablet (0.5 mg total) by mouth 2 (two) times daily as needed for anxiety., Disp: 30 tablet, Rfl: 0   amiodarone (PACERONE) 200 MG tablet, Take 200 mg by mouth in the morning and at bedtime., Disp: , Rfl:    apixaban (ELIQUIS) 5 MG TABS tablet, Take 1 tablet (5 mg total) by mouth 2 (two) times daily., Disp: 180 tablet, Rfl: 3   B-D ULTRAFINE III SHORT PEN 31G X 8 MM MISC, 1 each by Other route in the morning, at noon, in the evening, and at bedtime., Disp: , Rfl:    blood glucose meter kit and supplies KIT, Dispense based on patient and insurance preference. Use up to four times daily as directed., Disp: 1 each, Rfl: 0   blood glucose meter kit and supplies, Dispense based on patient and insurance preference. Use up to four times daily as directed. (FOR ICD-10 E10.9, E11.9)., Disp: 1 each, Rfl: 0   Blood Glucose Monitoring Suppl (ACCU-CHEK GUIDE ME) w/Device KIT, Use as instructed to check blood sugar three times  daily. E11.69, Disp: 1 kit, Rfl: 0   Blood Glucose Monitoring Suppl (ONETOUCH VERIO) w/Device KIT, Use to check blood sugar TID. E11.69, Disp: 1 kit, Rfl: 0   busPIRone (BUSPAR) 5 MG tablet, Take 1 tablet (5 mg total) by mouth 2 (two) times daily., Disp: 60 tablet, Rfl: 1   colchicine 0.6 MG tablet, Taking 1/2 tablet by mouth daily, Disp: , Rfl:    docusate sodium (COLACE) 250 MG capsule, Take 100 mg by mouth daily., Disp: , Rfl:     Dulaglutide (TRULICITY) 1.5 OI/7.1IW SOPN, Inject 1.5 mg into the skin once a week., Disp: 0.5 mL, Rfl: 3   DULoxetine (CYMBALTA) 60 MG capsule, Take 1 capsule (60 mg total) by mouth daily., Disp: 90 capsule, Rfl: 3   EPINEPHrine 0.3 mg/0.3 mL IJ SOAJ injection, Inject 0.3 mg into the muscle as needed for anaphylaxis., Disp: 1 each, Rfl: 1   fluticasone (FLONASE) 50 MCG/ACT nasal spray, Place 2 sprays into both nostrils daily., Disp: 16 g, Rfl: 6   glucose blood (ACCU-CHEK GUIDE) test strip, Use to check blood sugar TID. E11.69, Disp: 100 each, Rfl: 3   HYDROcodone-acetaminophen (NORCO/VICODIN) 5-325 MG tablet, Take 1 tablet by mouth every 6 (six) hours as needed., Disp: 8 tablet, Rfl: 0   insulin aspart (NOVOLOG) 100 UNIT/ML injection, Inject 12 Units into the skin 3 (three) times daily before meals. For blood sugars 0-199 give 0 units of insulin, 201-250 give 4 units, 251-300 give 6 units, 301-350 give 8 units, 351-400 give 10 units,> 400 give 12 units and call M.D. (Patient taking differently: Inject 5-10 Units into the skin 3 (three) times daily before meals. For blood sugars 0-199 give 0 units of insulin, 201-250 give 4 units, 251-300 give 6 units, 301-350 give 8 units, 351-400 give 10 units,> 400 give 12 units and call M.D.), Disp: 10 mL, Rfl: 3   linaclotide (LINZESS) 72 MCG capsule, Patient takes 1 capsule every other day., Disp: , Rfl:    loratadine (CLARITIN) 10 MG tablet, Take 1 tablet (10 mg total) by mouth daily., Disp: 30 tablet, Rfl: 11   metolazone (ZAROXOLYN) 2.5 MG tablet, Take 1 tablet (2.5 mg total) by mouth 2 (two) times a week. Take every Monday and Friday, Disp: 90 tablet, Rfl: 3   metoprolol succinate (TOPROL XL) 25 MG 24 hr tablet, Take 1.5 tablets (37.5 mg total) by mouth daily., Disp: 30 tablet, Rfl: 11   Multiple Vitamins-Minerals (MULTIVITAMIN WOMEN PO), Take 1 tablet by mouth daily., Disp: , Rfl:    nitroGLYCERIN (NITROSTAT) 0.4 MG SL tablet, Place 1 tablet (0.4 mg total)  under the tongue every 5 (five) minutes x 3 doses as needed for chest pain., Disp: 25 tablet, Rfl: 12   ondansetron (ZOFRAN ODT) 4 MG disintegrating tablet, Take 1 tablet (4 mg total) by mouth every 8 (eight) hours as needed for nausea or vomiting., Disp: 20 tablet, Rfl: 0   pantoprazole (PROTONIX) 40 MG tablet, TAKE 1 TABLET BY MOUTH TWICE A DAY, Disp: 180 tablet, Rfl: 1   potassium chloride SA (KLOR-CON) 20 MEQ tablet, Take 4 tablets (80 mEq total) by mouth daily. Take an additional 40 meq with every dose of METOLAZONE, Disp: 66 tablet, Rfl: 6   RESTASIS 0.05 % ophthalmic emulsion, Place 1 drop into both eyes 2 (two) times daily., Disp: , Rfl: 12   rosuvastatin (CRESTOR) 10 MG tablet, Take 1 tablet (10 mg total) by mouth daily., Disp: 90 tablet, Rfl: 3   torsemide (DEMADEX) 20 MG  tablet, Take 5 tablets (100 mg total) by mouth daily. Can take an additional 2 tablets as needed for increased weight gain, Disp: 150 tablet, Rfl: 4 Allergies  Allergen Reactions   Sulfa Antibiotics Itching     Social History   Socioeconomic History   Marital status: Significant Other    Spouse name: Not on file   Number of children: 2   Years of education: Not on file   Highest education level: Some college, no degree  Occupational History   Not on file  Tobacco Use   Smoking status: Never   Smokeless tobacco: Never  Vaping Use   Vaping Use: Never used  Substance and Sexual Activity   Alcohol use: No   Drug use: No   Sexual activity: Not Currently  Other Topics Concern   Not on file  Social History Narrative   Pt lives in [place] with spouse.  2 grown children.   Previously worked in [employer] at [employer].  Now on disability   Social Determinants of Health   Financial Resource Strain: Low Risk    Difficulty of Paying Living Expenses: Not very hard  Food Insecurity: No Food Insecurity   Worried About Charity fundraiser in the Last Year: Never true   Ran Out of Food in the Last Year: Never true   Transportation Needs: No Transportation Needs   Lack of Transportation (Medical): No   Lack of Transportation (Non-Medical): No  Physical Activity: Not on file  Stress: Not on file  Social Connections: Not on file  Intimate Partner Violence: Not on file    Physical Exam Vitals reviewed.  Constitutional:      Appearance: Normal appearance. She is normal weight.  HENT:     Head: Normocephalic.     Nose: Nose normal.     Mouth/Throat:     Mouth: Mucous membranes are moist.     Pharynx: Oropharynx is clear.  Eyes:     Conjunctiva/sclera: Conjunctivae normal.     Pupils: Pupils are equal, round, and reactive to light.  Cardiovascular:     Rate and Rhythm: Normal rate. Rhythm irregular.     Pulses: Normal pulses.     Heart sounds: Normal heart sounds.  Pulmonary:     Effort: Pulmonary effort is normal. No respiratory distress.     Breath sounds: Normal breath sounds. No wheezing or rhonchi.  Abdominal:     General: Abdomen is flat.     Palpations: Abdomen is soft.  Musculoskeletal:        General: No swelling. Normal range of motion.     Cervical back: Normal range of motion.     Right lower leg: No edema.     Left lower leg: No edema.  Skin:    General: Skin is warm and dry.     Capillary Refill: Capillary refill takes less than 2 seconds.  Neurological:     General: No focal deficit present.     Mental Status: She is alert. Mental status is at baseline.  Psychiatric:        Mood and Affect: Mood normal.   Arrived for home visit for [facility] who reports feeling dizzy, fatigued with lack of appetite and increased shortness of breath. Last week was determined she was in a.flutter upon EKG and was referred to EP with Amiodarone increased to 27m BID. She reports no improvements this week. I obtained vitals and EKG and shared these with PA LMarlyce Hugein HF clinic. She suggested  to stay hydrated, be taking all medications and follow up with EP next week.   [name] was seen  by [facility] earlier this week and they suggested she start an Iron supplement daily. She reports she will pick up same this weekend.   I advised [name] to check her HR and BP at home and make a log of same for [place] to see how it is trending. She agreed. She understands if symptoms worsen to be further evaluated.   Home visit complete I will see [name] in one week.      Future Appointments  Date Time Provider [place]  07/25/2021 12:20 PM Shirley Friar, PA-C [facility]  07/27/2021  8:30 AM [name], NP [facility] None  07/28/2021 10:30 AM Leilani Merl, Alonna Buckler, LCSW [facility] None  08/24/2021  9:50 AM [name], NP [facility] None  08/24/2021  2:00 PM [facility] PA/NP [facility] None  10/03/2021  9:00 AM Buford Dresser, MD [facility]     ACTION: Home visit completed

## 2021-07-20 NOTE — Telephone Encounter (Signed)
-----   Message from Joette Catching, [place] sent at 07/20/2021  2:23 PM EST ----- K improved. Scr stable. No changes. Repeat BMET in 2 weeks

## 2021-07-25 ENCOUNTER — Ambulatory Visit: Payer: Medicare Other | Admitting: Student

## 2021-07-25 ENCOUNTER — Telehealth (HOSPITAL_COMMUNITY): Payer: Self-pay

## 2021-07-25 NOTE — Telephone Encounter (Signed)
Spoke to [place] who reports she is still having chest discomfort but at this time does not feel it requires an ER visit. I advised her to be sure to seek immediate care if necessary and to reach out to me if needed. I plan to see her Thursday after she sees Dr. Quentin Ore in clinic on Wednesday. Call complete.

## 2021-07-26 NOTE — Progress Notes (Signed)
Electrophysiology Office Note:    Date:  07/27/2021   ID:  [name], DOB 1956-08-10, MRN [id number]  PCP:  [name], NP  [facility] Cardiologist:  Buford Dresser, MD  [facility] Electrophysiologist:  Vickie Epley, MD   Referring MD: Joette Catching, *   Chief Complaint: Atrial fibrillation/flutter  History of Present Illness:    [name] is a 65 y.o. female who presents for an evaluation of atrial fibrillation/flutter.  The patient was previously seen by Dr. Rayann Heman.. Their medical history includes chronic systolic heart failure, COPD, DVT, hypertension, obesity, sleep apnea with CPAP.  The patient was last seen by the heart failure clinic July 13, 2021.  At that appointment she reported several days of dyspnea and was out of rhythm on the EKG.  She has previously failed Tikosyn.  She had a maze in May 2022 with multiple cardioversions.  The patient saw Dr. Rayann Heman May 30, 2021 in clinic.  Today the patient reports continued shortness of breath and chest discomfort that is intermittent.  She does me her shortness of breath is really limiting.  She is seeing Dr. [name] in the heart failure clinic for the same.   Past Medical History:  Diagnosis Date   Allergic rhinitis    Arthritis    Asthma    Chest pain 12/27/2017   Chronic diastolic CHF (congestive heart failure) (HCC)    COPD (chronic obstructive pulmonary disease) (HCC)    Depression    DM (diabetes mellitus) ([place])    DVT (deep vein thrombosis) in pregnancy    Dysrhythmia    atrial fibrilation   GERD (gastroesophageal reflux disease)    Headache(784.0)    HTN (hypertension)    Hyperlipidemia    Obesity    Pneumonia 04/2018   RIGHT LOBE   Shortness of breath    Sleep apnea    compliant with CPAP    Past Surgical History:  Procedure Laterality Date   ABDOMINAL HYSTERECTOMY     BUBBLE STUDY  11/26/2020   Procedure: BUBBLE STUDY;  Surgeon:  Buford Dresser, MD;  Location: [place];  Service: Cardiovascular;;   CARDIOVERSION N/A 05/06/2020   Procedure: CARDIOVERSION;  Surgeon: Buford Dresser, MD;  Location: [facility] ENDOSCOPY;  Service: Cardiovascular;  Laterality: N/A;   CARDIOVERSION N/A 11/04/2020   Procedure: CARDIOVERSION;  Surgeon: Lelon Perla, MD;  Location: [place];  Service: Cardiovascular;  Laterality: N/A;   CARDIOVERSION N/A 02/01/2021   Procedure: CARDIOVERSION;  Surgeon: Jolaine Artist, MD;  Location: [place];  Service: Cardiovascular;  Laterality: N/A;   CARDIOVERSION N/A 02/09/2021   Procedure: CARDIOVERSION;  Surgeon: Jolaine Artist, MD;  Location: [place];  Service: Cardiovascular;  Laterality: N/A;   CARDIOVERSION N/A 04/19/2021   Procedure: CARDIOVERSION;  Surgeon: Fay Records, MD;  Location: [place];  Service: Cardiovascular;  Laterality: N/A;   CATARACT EXTRACTION, BILATERAL     CHOLECYSTECTOMY     COLONOSCOPY WITH PROPOFOL N/A 03/05/2015   Procedure: COLONOSCOPY WITH PROPOFOL;  Surgeon: Carol Ada, MD;  Location: [facility] ENDOSCOPY;  Service: Endoscopy;  Laterality: N/A;   DIAGNOSTIC LAPAROSCOPY     ingrown hallux Left    KNEE SURGERY     LEFT HEART CATH AND CORONARY ANGIOGRAPHY N/A 12/31/2017   Procedure: LEFT HEART CATH AND CORONARY ANGIOGRAPHY;  Surgeon: Charolette Forward, MD;  Location: [place] CV LAB;  Service: Cardiovascular;  Laterality: N/A;   MAZE N/A 12/29/2020   Procedure: MAZE;  Surgeon: [name], MD;  Location: [facility] OR;  Service: Open Heart Surgery;  Laterality: N/A;   MITRAL VALVE REPAIR N/A 12/29/2020   Procedure: MITRAL VALVE REPAIR USING CARBOMEDICS ANNULOFLEX RING SIZE 30;  Surgeon: [name], MD;  Location: [place];  Service: Open Heart Surgery;  Laterality: N/A;   RIGHT HEART CATH N/A 12/22/2020   Procedure: RIGHT HEART CATH;  Surgeon: Larey Dresser, MD;  Location: [place] CV LAB;  Service: Cardiovascular;  Laterality:  N/A;   RIGHT HEART CATH N/A 01/31/2021   Procedure: RIGHT HEART CATH;  Surgeon: Jolaine Artist, MD;  Location: [place] CV LAB;  Service: Cardiovascular;  Laterality: N/A;   TEE WITHOUT CARDIOVERSION N/A 11/26/2020   Procedure: TRANSESOPHAGEAL ECHOCARDIOGRAM (TEE);  Surgeon: Buford Dresser, MD;  Location: [facility] ENDOSCOPY;  Service: Cardiovascular;  Laterality: N/A;   TEE WITHOUT CARDIOVERSION N/A 12/29/2020   Procedure: TRANSESOPHAGEAL ECHOCARDIOGRAM (TEE);  Surgeon: [name], MD;  Location: [place];  Service: Open Heart Surgery;  Laterality: N/A;   TRICUSPID VALVE REPLACEMENT N/A 12/29/2020   Procedure: 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 RING SIZE 34;  Surgeon: [name], MD;  Location: [place];  Service: Open Heart Surgery;  Laterality: N/A;    Current Medications: Current Meds  Medication Sig   albuterol (VENTOLIN HFA) 108 (90 Base) MCG/ACT inhaler Inhale 2 puffs into the lungs every 4 (four) hours as needed (Asthma).   Alcohol Swabs (B-D SINGLE USE SWABS REGULAR) PADS 1 each by Other route daily.   ALPRAZolam (XANAX) 0.5 MG tablet Take 1 tablet (0.5 mg total) by mouth 2 (two) times daily as needed for anxiety.   amiodarone (PACERONE) 200 MG tablet Take 200 mg by mouth in the morning and at bedtime.   apixaban (ELIQUIS) 5 MG TABS tablet Take 1 tablet (5 mg total) by mouth 2 (two) times daily.   B-D ULTRAFINE III SHORT PEN 31G X 8 MM MISC 1 each by Other route in the morning, at noon, in the evening, and at bedtime.   blood glucose meter kit and supplies KIT Dispense based on patient and insurance preference. Use up to four times daily as directed.   blood glucose meter kit and supplies Dispense based on patient and insurance preference. Use up to four times daily as directed. (FOR ICD-10 E10.9, E11.9).   Blood Glucose Monitoring Suppl (ACCU-CHEK GUIDE ME) w/Device KIT Use as instructed to check blood sugar three times daily. E11.69   Blood Glucose  Monitoring Suppl (ONETOUCH VERIO) w/Device KIT Use to check blood sugar TID. E11.69   busPIRone (BUSPAR) 5 MG tablet Take 1 tablet (5 mg total) by mouth 2 (two) times daily.   colchicine 0.6 MG tablet Taking 1/2 tablet by mouth daily   docusate sodium (COLACE) 250 MG capsule Take 100 mg by mouth daily.   Dulaglutide (TRULICITY) 1.5 BZ/1.6RC SOPN Inject 1.5 mg into the skin once a week.   DULoxetine (CYMBALTA) 60 MG capsule Take 1 capsule (60 mg total) by mouth daily.   EPINEPHrine 0.3 mg/0.3 mL IJ SOAJ injection Inject 0.3 mg into the muscle as needed for anaphylaxis.   fluticasone (FLONASE) 50 MCG/ACT nasal spray Place 2 sprays into both nostrils daily.   glucose blood (ACCU-CHEK GUIDE) test strip Use to check blood sugar TID. E11.69   HYDROcodone-acetaminophen (NORCO/VICODIN) 5-325 MG tablet Take 1 tablet by mouth every 6 (six) hours as needed.   insulin aspart (NOVOLOG) 100 UNIT/ML injection Inject 12 Units into the skin 3 (three) times daily before  meals. For blood sugars 0-199 give 0 units of insulin, 201-250 give 4 units, 251-300 give 6 units, 301-350 give 8 units, 351-400 give 10 units,> 400 give 12 units and call M.D. (Patient taking differently: Inject 5-10 Units into the skin 3 (three) times daily before meals. For blood sugars 0-199 give 0 units of insulin, 201-250 give 4 units, 251-300 give 6 units, 301-350 give 8 units, 351-400 give 10 units,> 400 give 12 units and call M.D.)   Lancets [facility] DELICA PLUS [id number]) MISC Apply topically 3 (three) times daily.   LEVEMIR 100 UNIT/ML injection SMARTSIG:0.4 Milliliter(s) SUB-Q Twice Daily   linaclotide (LINZESS) 72 MCG capsule Patient takes 1 capsule every other day.   loratadine (CLARITIN) 10 MG tablet Take 1 tablet (10 mg total) by mouth daily.   metolazone (ZAROXOLYN) 2.5 MG tablet Take 1 tablet (2.5 mg total) by mouth 2 (two) times a week. Take every Monday and Friday   metoprolol succinate (TOPROL XL) 25 MG 24 hr tablet Take 1.5  tablets (37.5 mg total) by mouth daily.   Multiple Vitamins-Minerals (MULTIVITAMIN WOMEN PO) Take 1 tablet by mouth daily.   nitroGLYCERIN (NITROSTAT) 0.4 MG SL tablet Place 1 tablet (0.4 mg total) under the tongue every 5 (five) minutes x 3 doses as needed for chest pain.   ondansetron (ZOFRAN ODT) 4 MG disintegrating tablet Take 1 tablet (4 mg total) by mouth every 8 (eight) hours as needed for nausea or vomiting.   pantoprazole (PROTONIX) 40 MG tablet TAKE 1 TABLET BY MOUTH TWICE A DAY   potassium chloride SA (KLOR-CON) 20 MEQ tablet Take 4 tablets (80 mEq total) by mouth daily. Take an additional 40 meq with every dose of METOLAZONE   RESTASIS 0.05 % ophthalmic emulsion Place 1 drop into both eyes 2 (two) times daily.   rosuvastatin (CRESTOR) 10 MG tablet Take 1 tablet (10 mg total) by mouth daily.   torsemide (DEMADEX) 100 MG tablet Take 100 mg by mouth daily.     Allergies:   Sulfa antibiotics   Social History   Socioeconomic History   Marital status: Significant Other    Spouse name: Not on file   Number of children: 2   Years of education: Not on file   Highest education level: Some college, no degree  Occupational History   Not on file  Tobacco Use   Smoking status: Never   Smokeless tobacco: Never  Vaping Use   Vaping Use: Never used  Substance and Sexual Activity   Alcohol use: No   Drug use: No   Sexual activity: Not Currently  Other Topics Concern   Not on file  Social History Narrative   Pt lives in [place] with spouse.  2 grown children.   Previously worked in [employer] at [employer].  Now on disability   Social Determinants of Health   Financial Resource Strain: Low Risk    Difficulty of Paying Living Expenses: Not very hard  Food Insecurity: No Food Insecurity   Worried About Charity fundraiser in the Last Year: Never true   Ran Out of Food in the Last Year: Never true  Transportation Needs: No Transportation Needs   Lack of Transportation (Medical): No    Lack of Transportation (Non-Medical): No  Physical Activity: Not on file  Stress: Not on file  Social Connections: Not on file     Family History: The patient's family history includes CAD in an other family member; Cancer in her father and mother; Hypertension  in her brother, mother, and sister.  ROS:   Please see the history of present illness.    All other systems reviewed and are negative.  EKGs/Labs/Other Studies Reviewed:    The following studies were reviewed today:  February 28, 2021 echo Left ventricular function mildly decreased 45% Global hypokinesis Right ventricular function mildly reduced Severely dilated left atrium Moderately dilated right atrium Mitral repair with annuloplasty ring Moderate TR  July 13, 2021 EKG shows atypical atrial flutter with a ventricular rate of 90 bpm.   EKG:  The ekg ordered today demonstrates sinus rhythm.  Frequent PVCs.  Low amplitude QRS.   Recent Labs: 05/04/2021: Magnesium 2.3 05/19/2021: B Natriuretic Peptide 285.3 06/21/2021: Hemoglobin 11.6; Platelets 185 07/13/2021: ALT 32; TSH 2.666 07/20/2021: BUN 49; Creatinine, Ser 3.08; Potassium 3.8; Sodium 140  Recent Lipid Panel    Component Value Date/Time   CHOL 158 05/03/2021 0603   CHOL 140 06/24/2020 1054   TRIG 83 05/03/2021 0603   HDL 50 05/03/2021 0603   HDL 46 06/24/2020 1054   CHOLHDL 3.2 05/03/2021 0603   VLDL 17 05/03/2021 0603   LDLCALC 91 05/03/2021 0603   LDLCALC 75 06/24/2020 1054    Physical Exam:    VS:  BP 128/70   Pulse 79   Ht $R'5\' 7"'QK$  (1.702 m)   Wt 285 lb (129.3 kg)   SpO2 99%   BMI 44.64 kg/m     Wt Readings from Last 3 Encounters:  07/27/21 285 lb (129.3 kg)  07/20/21 286 lb (129.7 kg)  07/13/21 282 lb (127.9 kg)     GEN: Well nourished, well developed in no acute distress.  Morbidly obese HEENT: Normal NECK: No JVD; No carotid bruits LYMPHATICS: No lymphadenopathy CARDIAC: RRR, no murmurs, rubs, gallops RESPIRATORY:  Clear to  auscultation without rales, wheezing or rhonchi  ABDOMEN: Soft, non-tender, non-distended MUSCULOSKELETAL:  No edema; No deformity  SKIN: Warm and dry NEUROLOGIC:  Alert and oriented x 3 PSYCHIATRIC:  Normal affect       ASSESSMENT:    1. Chronic combined systolic and diastolic heart failure ([place])   2. CKD (chronic kidney disease) stage 4, GFR 15-29 ml/min (HCC)   3. DOE (dyspnea on exertion)   4. Morbid obesity ([place])    PLAN:    In order of problems listed above:  #Chronic combined systolic and [place] heart failure NYHA class III today.  Complains of significant dyspnea with exertion.  She is in normal sinus rhythm today. I do not suspect her dyspnea is related to her atrial fibs/flutter. Seems like a large component is related to her right heart dysfunction.  #Atrial fibrillation/flutter In rhythm todaY.  Continue amiodarone twice daily for another week and then drop back down to 200 mg by mouth once daily.  She has had recent blood work for monitoring of the amiodarone.  I will have her follow-up in 6 months with our clinic.  At that appointment, can discuss stopping the amiodarone if she has maintain normal rhythm.  If she continues to have struggles with maintaining normal rhythm, can discuss pacemaker plus AV junction ablation in the future.  #Morbid obesity I suspect this is also contributing to her dyspnea.  Follow-up with EP in 6 months with APP.       Medication Adjustments/Labs and Tests Ordered: Current medicines are reviewed at length with the patient today.  Concerns regarding medicines are outlined above.  No orders of the defined types were placed in this encounter.  No orders  of the defined types were placed in this encounter.    Signed, Hilton Cork. Quentin Ore, MD, [facility], [facility] 07/27/2021 3:37 PM    Electrophysiology [facility]

## 2021-07-27 ENCOUNTER — Ambulatory Visit (INDEPENDENT_AMBULATORY_CARE_PROVIDER_SITE_OTHER): Payer: Medicare Other | Admitting: Cardiology

## 2021-07-27 ENCOUNTER — Other Ambulatory Visit: Payer: Self-pay

## 2021-07-27 ENCOUNTER — Other Ambulatory Visit (INDEPENDENT_AMBULATORY_CARE_PROVIDER_SITE_OTHER): Payer: Self-pay | Admitting: Primary Care

## 2021-07-27 ENCOUNTER — Ambulatory Visit (INDEPENDENT_AMBULATORY_CARE_PROVIDER_SITE_OTHER): Payer: Medicare Other | Admitting: Primary Care

## 2021-07-27 VITALS — BP 128/70 | HR 79 | Ht 67.0 in | Wt 285.0 lb

## 2021-07-27 DIAGNOSIS — I259 Chronic ischemic heart disease, unspecified: Secondary | ICD-10-CM

## 2021-07-27 DIAGNOSIS — N184 Chronic kidney disease, stage 4 (severe): Secondary | ICD-10-CM | POA: Diagnosis not present

## 2021-07-27 DIAGNOSIS — I5042 Chronic combined systolic (congestive) and diastolic (congestive) heart failure: Secondary | ICD-10-CM

## 2021-07-27 DIAGNOSIS — R0609 Other forms of dyspnea: Secondary | ICD-10-CM | POA: Diagnosis not present

## 2021-07-27 MED ORDER — AMIODARONE HCL 200 MG PO TABS: ORAL_TABLET | ORAL | 3 refills | Status: DC

## 2021-07-27 NOTE — Progress Notes (Signed)
[facility]   Telephone Note  I connected with Sola Margolis, on 07/27/2021 at 2:46 PM  by telephone and verified that I am speaking with the correct person using two identifiers.   Consent: I discussed the limitations, risks, security and privacy concerns of performing an evaluation and management service by telephone and the availability of in person appointments. I also discussed with the patient that there may be a patient responsible charge related to this service. The patient expressed understanding and agreed to proceed.   Location of Patient: Cardiology   Location of Provider: [facility]   Persons participating in Telemedicine visit: Scheryl Sanborn Juluis Mire,  NP [place] , CMA  History of Present Illness: [name] is a 65 year old female who is experiencing chest pain and not feeling well and will be seen by cardiologist today. Explain was pleased she listen and went to the cardiologist for chest pain. She will inform PCP about appt.   Past Medical History:  Diagnosis Date   Allergic rhinitis    Arthritis    Asthma    Chest pain 12/27/2017   Chronic diastolic CHF (congestive heart failure) (HCC)    COPD (chronic obstructive pulmonary disease) (HCC)    Depression    DM (diabetes mellitus) ([place])    DVT (deep vein thrombosis) in pregnancy    Dysrhythmia    atrial fibrilation   GERD (gastroesophageal reflux disease)    Headache(784.0)    HTN (hypertension)    Hyperlipidemia    Obesity    Pneumonia 04/2018   RIGHT LOBE   Shortness of breath    Sleep apnea    compliant with CPAP   Allergies  Allergen Reactions   Sulfa Antibiotics Itching    Current Outpatient Medications on File Prior to Visit  Medication Sig Dispense Refill   albuterol (VENTOLIN HFA) 108 (90 Base) MCG/ACT inhaler Inhale 2 puffs into the lungs every 4 (four) hours as needed  (Asthma).     Alcohol Swabs (B-D SINGLE USE SWABS REGULAR) PADS 1 each by Other route daily. 100 each 11   ALPRAZolam (XANAX) 0.5 MG tablet Take 1 tablet (0.5 mg total) by mouth 2 (two) times daily as needed for anxiety. 30 tablet 0   amiodarone (PACERONE) 200 MG tablet Take 200 mg by mouth in the morning and at bedtime.     apixaban (ELIQUIS) 5 MG TABS tablet Take 1 tablet (5 mg total) by mouth 2 (two) times daily. 180 tablet 3   B-D ULTRAFINE III SHORT PEN 31G X 8 MM MISC 1 each by Other route in the morning, at noon, in the evening, and at bedtime.     blood glucose meter kit and supplies KIT Dispense based on patient and insurance preference. Use up to four times daily as directed. 1 each 0   blood glucose meter kit and supplies Dispense based on patient and insurance preference. Use up to four times daily as directed. (FOR ICD-10 E10.9, E11.9). 1 each 0   Blood Glucose Monitoring Suppl (ACCU-CHEK GUIDE ME) w/Device KIT Use as instructed to check blood sugar three times daily. E11.69 1 kit 0   Blood Glucose Monitoring Suppl (ONETOUCH VERIO) w/Device KIT Use to check blood sugar TID. E11.69 1 kit 0   busPIRone (BUSPAR) 5 MG tablet Take 1 tablet (5 mg total) by mouth 2 (two) times daily. 60 tablet 1   colchicine 0.6 MG tablet Taking 1/2 tablet by  mouth daily     docusate sodium (COLACE) 250 MG capsule Take 100 mg by mouth daily.     Dulaglutide (TRULICITY) 1.5 MG/0.5ML SOPN Inject 1.5 mg into the skin once a week. 0.5 mL 3   DULoxetine (CYMBALTA) 60 MG capsule Take 1 capsule (60 mg total) by mouth daily. 90 capsule 3   EPINEPHrine 0.3 mg/0.3 mL IJ SOAJ injection Inject 0.3 mg into the muscle as needed for anaphylaxis. 1 each 1   fluticasone (FLONASE) 50 MCG/ACT nasal spray Place 2 sprays into both nostrils daily. 16 g 6   glucose blood (ACCU-CHEK GUIDE) test strip Use to check blood sugar TID. E11.69 100 each 3   HYDROcodone-acetaminophen (NORCO/VICODIN) 5-325 MG tablet Take 1 tablet by mouth  every 6 (six) hours as needed. (Patient not taking: Reported on 07/21/2021) 8 tablet 0   insulin aspart (NOVOLOG) 100 UNIT/ML injection Inject 12 Units into the skin 3 (three) times daily before meals. For blood sugars 0-199 give 0 units of insulin, 201-250 give 4 units, 251-300 give 6 units, 301-350 give 8 units, 351-400 give 10 units,> 400 give 12 units and call M.D. (Patient taking differently: Inject 5-10 Units into the skin 3 (three) times daily before meals. For blood sugars 0-199 give 0 units of insulin, 201-250 give 4 units, 251-300 give 6 units, 301-350 give 8 units, 351-400 give 10 units,> 400 give 12 units and call M.D.) 10 mL 3   linaclotide (LINZESS) 72 MCG capsule Patient takes 1 capsule every other day.     loratadine (CLARITIN) 10 MG tablet Take 1 tablet (10 mg total) by mouth daily. 30 tablet 11   metolazone (ZAROXOLYN) 2.5 MG tablet Take 1 tablet (2.5 mg total) by mouth 2 (two) times a week. Take every Monday and Friday 90 tablet 3   metoprolol succinate (TOPROL XL) 25 MG 24 hr tablet Take 1.5 tablets (37.5 mg total) by mouth daily. 30 tablet 11   Multiple Vitamins-Minerals (MULTIVITAMIN WOMEN PO) Take 1 tablet by mouth daily.     nitroGLYCERIN (NITROSTAT) 0.4 MG SL tablet Place 1 tablet (0.4 mg total) under the tongue every 5 (five) minutes x 3 doses as needed for chest pain. 25 tablet 12   ondansetron (ZOFRAN ODT) 4 MG disintegrating tablet Take 1 tablet (4 mg total) by mouth every 8 (eight) hours as needed for nausea or vomiting. 20 tablet 0   pantoprazole (PROTONIX) 40 MG tablet TAKE 1 TABLET BY MOUTH TWICE A DAY 180 tablet 1   potassium chloride SA (KLOR-CON) 20 MEQ tablet Take 4 tablets (80 mEq total) by mouth daily. Take an additional 40 meq with every dose of METOLAZONE 66 tablet 6   RESTASIS 0.05 % ophthalmic emulsion Place 1 drop into both eyes 2 (two) times daily.  12   rosuvastatin (CRESTOR) 10 MG tablet Take 1 tablet (10 mg total) by mouth daily. 90 tablet 3   torsemide  (DEMADEX) 20 MG tablet Take 5 tablets (100 mg total) by mouth daily. Can take an additional 2 tablets as needed for increased weight gain 150 tablet 4   No current facility-administered medications on file prior to visit.    Observations/Objective: Chest pain   Assessment and Plan: Sherica was seen today for hospitalization follow-up.  Diagnoses and all orders for this visit:  Chest pain due to myocardial ischemia, unspecified ischemic chest pain type Chest pain - being seen by cardiologist today a long hx of heart disease   Follow Up Instructions: March DM  I discussed the assessment and treatment plan with the patient. The patient was provided an opportunity to ask questions and all were answered. The patient agreed with the plan and demonstrated an understanding of the instructions.   The patient was advised to call back or seek an in-person evaluation if the symptoms worsen or if the condition fails to improve as anticipated.     I provided 8 minutes total of non-face-to-face time during this encounter including median intraservice time, reviewing previous notes, investigations, ordering medications, medical decision making, coordinating care and patient verbalized understanding at the end of the visit.    This note has been created with Surveyor, quantity. Any transcriptional errors are unintentional.   [name], NP 07/27/2021, 2:46 PM

## 2021-07-27 NOTE — Patient Instructions (Addendum)
Medication Instructions:  Your physician has recommended you make the following change in your medication:    CONTINUE amiodarone 200 mg - Take one tablet by mouth TWICE a day for another 7 days  2.   On August 03, 2021-REDUCE your amiodarone 200 mg-  Take one tablet by mouth ONCE a day  Lab Work: None ordered. If you have labs (blood work) drawn today and your tests are completely normal, you will receive your results only by: [place] (if you have MyChart) OR A paper copy in the mail If you have any lab test that is abnormal or we need to change your treatment, we will call you to review the results.  Testing/Procedures: None ordered.  Follow-Up: At [facility], you and your health needs are our priority.  As part of our continuing mission to provide you with exceptional heart care, we have created designated Provider Care Teams.  These Care Teams include your primary Cardiologist (physician) and Advanced Practice Providers (APPs -  Physician Assistants and Nurse Practitioners) who all work together to provide you with the care you need, when you need it.  Your next appointment:   Your physician wants you to follow-up in: 6 months with one of the following Advanced Practice Providers on your designated Care Team:   Tommye Standard, [place] Legrand Como "Jonni Sanger" [place], [place] You will receive a reminder letter in the mail two months in advance. If you don't receive a letter, please call our office to schedule the follow-up appointment.

## 2021-07-28 ENCOUNTER — Emergency Department (HOSPITAL_BASED_OUTPATIENT_CLINIC_OR_DEPARTMENT_OTHER): Payer: Medicare Other | Admitting: Radiology

## 2021-07-28 ENCOUNTER — Other Ambulatory Visit (HOSPITAL_COMMUNITY): Payer: Self-pay

## 2021-07-28 ENCOUNTER — Other Ambulatory Visit: Payer: Self-pay

## 2021-07-28 ENCOUNTER — Institutional Professional Consult (permissible substitution) (INDEPENDENT_AMBULATORY_CARE_PROVIDER_SITE_OTHER): Payer: Medicare Other | Admitting: Clinical

## 2021-07-28 ENCOUNTER — Encounter (HOSPITAL_BASED_OUTPATIENT_CLINIC_OR_DEPARTMENT_OTHER): Payer: Self-pay | Admitting: Emergency Medicine

## 2021-07-28 ENCOUNTER — Telehealth: Payer: Self-pay | Admitting: Cardiology

## 2021-07-28 ENCOUNTER — Observation Stay (HOSPITAL_BASED_OUTPATIENT_CLINIC_OR_DEPARTMENT_OTHER)
Admission: EM | Admit: 2021-07-28 | Discharge: 2021-07-31 | Disposition: A | Discharge status: Home / Self Care | Payer: Medicare Other | Attending: Family Medicine | Admitting: Family Medicine

## 2021-07-28 DIAGNOSIS — R079 Chest pain, unspecified: Principal | ICD-10-CM | POA: Diagnosis present

## 2021-07-28 DIAGNOSIS — R112 Nausea with vomiting, unspecified: Secondary | ICD-10-CM | POA: Diagnosis present

## 2021-07-28 DIAGNOSIS — N184 Chronic kidney disease, stage 4 (severe): Secondary | ICD-10-CM | POA: Diagnosis not present

## 2021-07-28 DIAGNOSIS — Z7901 Long term (current) use of anticoagulants: Secondary | ICD-10-CM | POA: Diagnosis not present

## 2021-07-28 DIAGNOSIS — R0602 Shortness of breath: Secondary | ICD-10-CM | POA: Insufficient documentation

## 2021-07-28 DIAGNOSIS — I50813 Acute on chronic right heart failure: Secondary | ICD-10-CM | POA: Diagnosis present

## 2021-07-28 DIAGNOSIS — Z20822 Contact with and (suspected) exposure to covid-19: Secondary | ICD-10-CM | POA: Insufficient documentation

## 2021-07-28 DIAGNOSIS — R9431 Abnormal electrocardiogram [ECG] [EKG]: Secondary | ICD-10-CM | POA: Diagnosis present

## 2021-07-28 DIAGNOSIS — E119 Type 2 diabetes mellitus without complications: Secondary | ICD-10-CM | POA: Insufficient documentation

## 2021-07-28 DIAGNOSIS — Z79899 Other long term (current) drug therapy: Secondary | ICD-10-CM | POA: Insufficient documentation

## 2021-07-28 DIAGNOSIS — I480 Paroxysmal atrial fibrillation: Secondary | ICD-10-CM | POA: Diagnosis present

## 2021-07-28 DIAGNOSIS — J449 Chronic obstructive pulmonary disease, unspecified: Secondary | ICD-10-CM | POA: Diagnosis present

## 2021-07-28 DIAGNOSIS — E876 Hypokalemia: Secondary | ICD-10-CM | POA: Diagnosis present

## 2021-07-28 DIAGNOSIS — E1169 Type 2 diabetes mellitus with other specified complication: Secondary | ICD-10-CM

## 2021-07-28 DIAGNOSIS — I5042 Chronic combined systolic (congestive) and diastolic (congestive) heart failure: Secondary | ICD-10-CM | POA: Diagnosis not present

## 2021-07-28 DIAGNOSIS — G4733 Obstructive sleep apnea (adult) (pediatric): Secondary | ICD-10-CM | POA: Diagnosis present

## 2021-07-28 DIAGNOSIS — N179 Acute kidney failure, unspecified: Secondary | ICD-10-CM | POA: Diagnosis present

## 2021-07-28 DIAGNOSIS — I5021 Acute systolic (congestive) heart failure: Secondary | ICD-10-CM | POA: Insufficient documentation

## 2021-07-28 DIAGNOSIS — J45909 Unspecified asthma, uncomplicated: Secondary | ICD-10-CM | POA: Insufficient documentation

## 2021-07-28 DIAGNOSIS — Z794 Long term (current) use of insulin: Secondary | ICD-10-CM | POA: Diagnosis not present

## 2021-07-28 DIAGNOSIS — R6889 Other general symptoms and signs: Secondary | ICD-10-CM

## 2021-07-28 DIAGNOSIS — J9601 Acute respiratory failure with hypoxia: Secondary | ICD-10-CM

## 2021-07-28 DIAGNOSIS — I5043 Acute on chronic combined systolic (congestive) and diastolic (congestive) heart failure: Secondary | ICD-10-CM | POA: Diagnosis present

## 2021-07-28 LAB — RESP PANEL BY RT-PCR (FLU A&B, COVID) ARPGX2
Influenza A by PCR: NEGATIVE
Influenza B by PCR: NEGATIVE
SARS Coronavirus 2 by RT PCR: NEGATIVE

## 2021-07-28 LAB — TROPONIN I (HIGH SENSITIVITY)
Troponin I (High Sensitivity): 7 ng/L (ref <18)
Troponin I (High Sensitivity): 7 ng/L (ref <18)

## 2021-07-28 LAB — BASIC METABOLIC PANEL
Anion gap: 12 (ref 5–15)
BUN: 51 mg/dL — ABNORMAL HIGH (ref 8–23)
CO2: 34 mmol/L — ABNORMAL HIGH (ref 22–32)
Calcium: 10.1 mg/dL (ref 8.9–10.3)
Chloride: 93 mmol/L — ABNORMAL LOW (ref 98–111)
Creatinine, Ser: 3.66 mg/dL — ABNORMAL HIGH (ref 0.44–1.00)
GFR, Estimated: 13 mL/min — ABNORMAL LOW (ref >60)
Glucose, Bld: 108 mg/dL — ABNORMAL HIGH (ref 70–99)
Potassium: 3.4 mmol/L — ABNORMAL LOW (ref 3.5–5.1)
Sodium: 139 mmol/L (ref 135–145)

## 2021-07-28 LAB — CBC
HCT: 40.3 % (ref 36.0–46.0)
Hemoglobin: 12.8 g/dL (ref 12.0–15.0)
MCH: 28.4 pg (ref 26.0–34.0)
MCHC: 31.8 g/dL (ref 30.0–36.0)
MCV: 89.6 fL (ref 80.0–100.0)
Platelets: 220 10*3/uL (ref 150–400)
RBC: 4.5 MIL/uL (ref 3.87–5.11)
RDW: 14 % (ref 11.5–15.5)
WBC: 5 10*3/uL (ref 4.0–10.5)
nRBC: 0 % (ref 0.0–0.2)

## 2021-07-28 LAB — BRAIN NATRIURETIC PEPTIDE: B Natriuretic Peptide: 217.4 pg/mL — ABNORMAL HIGH (ref 0.0–100.0)

## 2021-07-28 MED ORDER — FUROSEMIDE 40 MG PO TABS
100.0000 mg | ORAL_TABLET | Freq: Once | ORAL | Status: AC
  Administered 2021-07-28: 100 mg via ORAL
  Filled 2021-07-28: qty 1

## 2021-07-28 MED ORDER — AEROCHAMBER PLUS FLO-VU MEDIUM MISC
1.0000 | Freq: Once | Status: AC
  Administered 2021-07-28: 1
  Filled 2021-07-28: qty 1

## 2021-07-28 NOTE — ED Provider Notes (Signed)
[facility] EMERGENCY DEPT Provider Note   CSN: [id number] Arrival date & time: 07/28/21  1500     History Chief Complaint  Patient presents with   Chest Pain    [name] is a 65 y.o. 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   Patient states that the chest pain has been going on for the past couple weeks.  It happens intermittently, and when it happens it feels like a stabbing pain that goes to the back.  She also states that she feels a chest pressure "like an elephant is sitting on my chest."  The chest pain will sometimes occur when she is sleeping and is not associated with exertion.  She will be short of breath with exertion, however.  SOB worsens with lying flat.  The last time she has had chest pain this severe was earlier this year, at which time she was admitted for heart failure exacerbation.  She denies dietary indiscretion.  No recent travel.  She has been compliant with her heart failure medications and her anticoagulation.  Otherwise, she states that she had greenish appearing emesis this morning x2.  She has tried Tylenol for the pain, which has not helped.  No other complaints or concerns today.   Chest Pain Associated symptoms: dizziness, shortness of breath and vomiting   Associated symptoms: no fever       Past Medical History:  Diagnosis Date   Allergic rhinitis    Arthritis    Asthma    Chest pain 12/27/2017   Chronic diastolic CHF (congestive heart failure) (HCC)    COPD (chronic obstructive pulmonary disease) (HCC)    Depression    DM (diabetes mellitus) ([place])    DVT (deep vein thrombosis) in pregnancy    Dysrhythmia    atrial fibrilation   GERD (gastroesophageal reflux disease)    Headache(784.0)    HTN (hypertension)    Hyperlipidemia     Obesity    Pneumonia 04/2018   RIGHT LOBE   Shortness of breath    Sleep apnea    compliant with CPAP    Patient Active Problem List   Diagnosis Date Noted   Acute systolic CHF (congestive heart failure) ([place]) 07/28/2021   Biventricular heart failure ([place])    Acute kidney injury ([place]) 05/02/2021   Acute on chronic systolic heart failure ([place]) 02/25/2021   Atrial flutter ([place])    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   Acute exacerbation of CHF (congestive heart failure) ([place]) 01/29/2021   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   Encounter for preoperative dental examination    Teeth missing    Gingivitis    Accretions on teeth    CHF exacerbation ([place]) 12/21/2020   Acute on chronic heart failure ([place]) 12/19/2020   Atrial fibrillation, permanent ([place]) 11/02/2020   Chronic combined systolic and diastolic heart failure ([place]) 05/06/2020   NICM (nonischemic cardiomyopathy) ([place]) 04/01/2020   Family history of heart disease 04/01/2020   Mitral regurgitation 04/01/2020   Severe tricuspid regurgitation 04/01/2020   Closed right ankle fracture 09/24/2019   DKA, type 2 ([place]) 09/24/2019   HHNC (hyperglycemic hyperosmolar nonketotic coma) ([place]) 02/21/2019   Hypokalemia 02/21/2019   Acute kidney injury superimposed on CKD ([place]) 02/21/2019   Acute on chronic left systolic heart failure ([place]) 07/01/2018  Congestive heart failure with left ventricular diastolic dysfunction, acute ([place]) 07/01/2018   Right upper lobe pneumonia 05/14/2018   Atrial fibrillation with RVR ([place]) 05/14/2018   Chronic atrial fibrillation ([place]) 43/15/4008   Diastolic dysfunction 67/61/9509   Diabetes mellitus type 2 in obese ([place]) 12/26/2017   Nausea and vomiting    Chest pain 02/26/2012   OSA (obstructive sleep apnea) 09/21/2011   Hypoxia 09/21/2011   Diabetes mellitus ([place]) 02/04/2010   Morbid obesity ([place]) 02/04/2010   AF (paroxysmal atrial fibrillation) ([place]) 02/04/2010   Gastroparesis 02/04/2010   Acute  gastroenteritis 02/04/2010    Past Surgical History:  Procedure Laterality Date   ABDOMINAL HYSTERECTOMY     BUBBLE STUDY  11/26/2020   Procedure: BUBBLE STUDY;  Surgeon: Buford Dresser, MD;  Location: [place];  Service: Cardiovascular;;   CARDIOVERSION N/A 05/06/2020   Procedure: CARDIOVERSION;  Surgeon: Buford Dresser, MD;  Location: [place];  Service: Cardiovascular;  Laterality: N/A;   CARDIOVERSION N/A 11/04/2020   Procedure: CARDIOVERSION;  Surgeon: Lelon Perla, MD;  Location: [place];  Service: Cardiovascular;  Laterality: N/A;   CARDIOVERSION N/A 02/01/2021   Procedure: CARDIOVERSION;  Surgeon: Jolaine Artist, MD;  Location: [facility] ENDOSCOPY;  Service: Cardiovascular;  Laterality: N/A;   CARDIOVERSION N/A 02/09/2021   Procedure: CARDIOVERSION;  Surgeon: Jolaine Artist, MD;  Location: [facility]COPY;  Service: Cardiovascular;  Laterality: N/A;   CARDIOVERSION N/A 04/19/2021   Procedure: CARDIOVERSION;  Surgeon: Fay Records, MD;  Location: [facility] ENDOSCOPY;  Service: Cardiovascular;  Laterality: N/A;   CATARACT EXTRACTION, BILATERAL     CHOLECYSTECTOMY     COLONOSCOPY WITH PROPOFOL N/A 03/05/2015   Procedure: COLONOSCOPY WITH PROPOFOL;  Surgeon: Carol Ada, MD;  Location: [facility] ENDOSCOPY;  Service: Endoscopy;  Laterality: N/A;   DIAGNOSTIC LAPAROSCOPY     ingrown hallux Left    KNEE SURGERY     LEFT HEART CATH AND CORONARY ANGIOGRAPHY N/A 12/31/2017   Procedure: LEFT HEART CATH AND CORONARY ANGIOGRAPHY;  Surgeon: Charolette Forward, MD;  Location: [place] CV LAB;  Service: Cardiovascular;  Laterality: N/A;   MAZE N/A 12/29/2020   Procedure: MAZE;  Surgeon: [name], MD;  Location: [place];  Service: Open Heart Surgery;  Laterality: N/A;   MITRAL VALVE REPAIR N/A 12/29/2020   Procedure: MITRAL VALVE REPAIR USING CARBOMEDICS ANNULOFLEX RING SIZE 30;  Surgeon: [name], MD;  Location: [place];  Service: Open Heart Surgery;  Laterality:  N/A;   RIGHT HEART CATH N/A 12/22/2020   Procedure: RIGHT HEART CATH;  Surgeon: Larey Dresser, MD;  Location: [place] CV LAB;  Service: Cardiovascular;  Laterality: N/A;   RIGHT HEART CATH N/A 01/31/2021   Procedure: RIGHT HEART CATH;  Surgeon: Jolaine Artist, MD;  Location: [place] CV LAB;  Service: Cardiovascular;  Laterality: N/A;   TEE WITHOUT CARDIOVERSION N/A 11/26/2020   Procedure: TRANSESOPHAGEAL ECHOCARDIOGRAM (TEE);  Surgeon: Buford Dresser, MD;  Location: [facility] ENDOSCOPY;  Service: Cardiovascular;  Laterality: N/A;   TEE WITHOUT CARDIOVERSION N/A 12/29/2020   Procedure: TRANSESOPHAGEAL ECHOCARDIOGRAM (TEE);  Surgeon: [name], MD;  Location: [place];  Service: Open Heart Surgery;  Laterality: N/A;   TRICUSPID VALVE REPLACEMENT N/A 12/29/2020   Procedure: 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 RING SIZE 34;  Surgeon: [name], MD;  Location: [place];  Service: Open Heart Surgery;  Laterality: N/A;     OB History   No obstetric history on file.     Family History  Problem Relation Age of Onset   Cancer Mother    Hypertension Mother    Cancer Father    CAD Other    Hypertension Sister    Hypertension Brother     Social History   Tobacco Use   Smoking status: Never   Smokeless tobacco: Never  Vaping Use   Vaping Use: Never used  Substance Use Topics   Alcohol use: No   Drug use: No    Home Medications Prior to Admission medications   Medication Sig Start Date End Date Taking? Authorizing Provider  albuterol (VENTOLIN HFA) 108 (90 Base) MCG/ACT inhaler Inhale 2 puffs into the lungs every 4 (four) hours as needed (Asthma). 08/15/19   [provider]  Alcohol Swabs (B-D SINGLE USE SWABS REGULAR) PADS 1 each by Other route daily. 07/06/21   [name], NP  ALPRAZolam Duanne Moron) 0.5 MG tablet Take 1 tablet (0.5 mg total) by mouth 2 (two) times daily as needed for anxiety. 02/15/21   [name], NP   amiodarone (PACERONE) 200 MG tablet Take 1 tablet (200 mg total) by mouth daily for 7 days, THEN 1 tablet (200 mg total) daily. 07/27/21 08/03/22  Vickie Epley, MD  apixaban (ELIQUIS) 5 MG TABS tablet Take 1 tablet (5 mg total) by mouth 2 (two) times daily. 05/16/21   [place], Maricela Bo, FNP  B-D ULTRAFINE III SHORT PEN 31G X 8 MM MISC 1 each by Other route in the morning, at noon, in the evening, and at bedtime. 11/18/20   [provider]  blood glucose meter kit and supplies KIT Dispense based on patient and insurance preference. Use up to four times daily as directed. 02/10/21   Patrecia Pour, MD  blood glucose meter kit and supplies Dispense based on patient and insurance preference. Use up to four times daily as directed. (FOR ICD-10 E10.9, E11.9). 07/06/21   [name], NP  Blood Glucose Monitoring Suppl (ACCU-CHEK GUIDE ME) w/Device KIT Use as instructed to check blood sugar three times daily. E11.69 04/21/20   Charlott Rakes, MD  Blood Glucose Monitoring Suppl (ONETOUCH VERIO) w/Device KIT Use to check blood sugar TID. E11.69 Patient not taking: Reported on 07/28/2021 02/04/21   Charlott Rakes, MD  busPIRone (BUSPAR) 5 MG tablet Take 1 tablet (5 mg total) by mouth 2 (two) times daily. 07/04/21   [name], NP  colchicine 0.6 MG tablet Taking 1/2 tablet by mouth daily    [provider]  docusate sodium (COLACE) 250 MG capsule Take 100 mg by mouth daily.    [provider]  Dulaglutide (TRULICITY) 1.5 FH/2.1FX SOPN Inject 1.5 mg into the skin once a week. 06/14/21   [name], NP  DULoxetine (CYMBALTA) 60 MG capsule Take 1 capsule (60 mg total) by mouth daily. 06/14/21   [name], NP  EPINEPHrine 0.3 mg/0.3 mL IJ SOAJ injection Inject 0.3 mg into the muscle as needed for anaphylaxis. 06/25/20   [name], NP  fluticasone (FLONASE) 50 MCG/ACT nasal spray Place 2 sprays into both nostrils daily. 05/24/21   [name], NP  glucose blood (ACCU-CHEK GUIDE) test strip Use to check blood sugar TID. E11.69 07/13/21   Charlott Rakes, MD  HYDROcodone-acetaminophen (NORCO/VICODIN) 5-325 MG tablet Take 1 tablet by mouth every 6 (six) hours as needed. Patient not taking: Reported on 07/28/2021 06/21/21   Valarie Merino, MD  insulin aspart (NOVOLOG) 100 UNIT/ML injection Inject 12 Units into the  skin 3 (three) times daily before meals. For blood sugars 0-199 give 0 units of insulin, 201-250 give 4 units, 251-300 give 6 units, 301-350 give 8 units, 351-400 give 10 units,> 400 give 12 units and call M.D. Patient taking differently: Inject 5-10 Units into the skin 3 (three) times daily before meals. For blood sugars 0-199 give 0 units of insulin, 201-250 give 4 units, 251-300 give 6 units, 301-350 give 8 units, 351-400 give 10 units,> 400 give 12 units and call M.D. 06/24/20   [name], NP  Lancets [facility] DELICA PLUS [id number]) MISC Apply topically 3 (three) times daily. 07/07/21   [provider]  LEVEMIR 100 UNIT/ML injection SMARTSIG:0.4 Milliliter(s) SUB-Q Twice Daily Patient not taking: Reported on 07/28/2021 07/03/21   [provider]  linaclotide Rolan Lipa) 72 MCG capsule Patient takes 1 capsule every other day.    [provider]  loratadine (CLARITIN) 10 MG tablet Take 1 tablet (10 mg total) by mouth daily. 05/24/21   [name], NP  metolazone (ZAROXOLYN) 2.5 MG tablet Take 1 tablet (2.5 mg total) by mouth 2 (two) times a week. Take every Monday and Friday 05/16/21 08/14/21  Rafael Bihari, FNP  metoprolol succinate (TOPROL XL) 25 MG 24 hr tablet Take 1.5 tablets (37.5 mg total) by mouth daily. 04/26/21 04/26/22  Sherran Needs, NP  Multiple Vitamins-Minerals (MULTIVITAMIN WOMEN PO) Take 1 tablet by mouth daily.    [provider]  nitroGLYCERIN (NITROSTAT) 0.4 MG SL tablet Place 1 tablet (0.4 mg total) under the tongue every 5 (five) minutes x 3 doses  as needed for chest pain. 01/08/14   Charolette Forward, MD  ondansetron (ZOFRAN ODT) 4 MG disintegrating tablet Take 1 tablet (4 mg total) by mouth every 8 (eight) hours as needed for nausea or vomiting. Patient not taking: Reported on 07/28/2021 03/21/21   [name], NP  pantoprazole (PROTONIX) 40 MG tablet TAKE 1 TABLET BY MOUTH TWICE A DAY 07/06/21   [name], NP  potassium chloride SA (KLOR-CON) 20 MEQ tablet Take 4 tablets (80 mEq total) by mouth daily. Take an additional 40 meq with every dose of METOLAZONE 07/13/21   Joette Catching, PA-C  RESTASIS 0.05 % ophthalmic emulsion Place 1 drop into both eyes 2 (two) times daily. 11/19/17   [provider]  rosuvastatin (CRESTOR) 10 MG tablet Take 1 tablet (10 mg total) by mouth daily. 05/16/21   Rafael Bihari, FNP  torsemide (DEMADEX) 100 MG tablet Take 100 mg by mouth daily. 07/11/21   [provider]    Allergies    Sulfa antibiotics  Review of Systems   Review of Systems  Constitutional:  Negative for chills and fever.  HENT: Negative.    Eyes: Negative.   Respiratory:  Positive for chest tightness and shortness of breath.   Cardiovascular:  Positive for chest pain and leg swelling.  Gastrointestinal:  Positive for vomiting.  Endocrine: Negative.   Genitourinary: Negative.   Musculoskeletal: Negative.   Skin: Negative.   Allergic/Immunologic: Negative.   Neurological:  Positive for dizziness.  Hematological: Negative.   Psychiatric/Behavioral: Negative.     Physical Exam Updated Vital Signs BP 117/70   Pulse 88   Temp 98.1 F (36.7 C) (Oral)   Resp (!) 22   Wt 129.3 kg   SpO2 96%   BMI 44.65 kg/m   Physical Exam Constitutional:      General: She is not in acute distress. HENT:  Head: Normocephalic and atraumatic.  Eyes:     Extraocular Movements: Extraocular movements intact.     Pupils: Pupils are equal, round, and reactive to light.  Cardiovascular:     Rate and  Rhythm: Normal rate. Rhythm irregular.     Heart sounds: No murmur heard.   No friction rub. No gallop.  Pulmonary:     Effort: Pulmonary effort is normal.     Breath sounds: Rales present. No wheezing or rhonchi.  Abdominal:     General: Abdomen is flat.     Tenderness: There is no abdominal tenderness.  Musculoskeletal:        General: Swelling present. Normal range of motion.  Skin:    General: Skin is warm and dry.  Neurological:     General: No focal deficit present.     Mental Status: She is alert and oriented to person, place, and time. Mental status is at baseline.  Psychiatric:        Mood and Affect: Mood normal.        Behavior: Behavior normal.    ED Results / Procedures / Treatments   Labs (all labs ordered are listed, but only abnormal results are displayed) Labs Reviewed  BASIC METABOLIC PANEL - Abnormal; Notable for the following components:      Result Value   Potassium 3.4 (*)    Chloride 93 (*)    CO2 34 (*)    Glucose, Bld 108 (*)    BUN 51 (*)    Creatinine, Ser 3.66 (*)    GFR, Estimated 13 (*)    All other components within normal limits  BRAIN NATRIURETIC PEPTIDE - Abnormal; Notable for the following components:   B Natriuretic Peptide 217.4 (*)    All other components within normal limits  RESP PANEL BY RT-PCR (FLU A&B, COVID) ARPGX2  CBC  TROPONIN I (HIGH SENSITIVITY)  TROPONIN I (HIGH SENSITIVITY)    EKG EKG Interpretation  Date/Time:  Thursday July 28 2021 15:12:14 EST Ventricular Rate:  86 PR Interval:    QRS Duration: 90 QT Interval:  420 QTC Calculation: 502 R Axis:   169 Text Interpretation: Atrial fibrillation with premature ventricular or aberrantly conducted complexes Possible Right ventricular hypertrophy Nonspecific ST abnormality Prolonged QT Abnormal ECG When compared to prior, similar Afib with PVCs and prolonged QTC. No STEMI Confirmed by Antony Blackbird [phone]) on 07/28/2021 3:21:44 PM  Radiology DG Chest 2  View  Result Date: 07/28/2021 CLINICAL DATA:  Chest pain. Additional history provided by scanning technologist: Patient reports chest pain with associated shortness of breath/dyspnea on exertion. Pain described as sharp. Pain radiates to mid back. EXAM: CHEST - 2 VIEW COMPARISON:  Prior chest radiographs 06/21/2021 and earlier. Chest CT 06/21/2021. FINDINGS: Prior median sternotomy and left atrial clipping. Prosthetic cardiac valve. Central pulmonary vascular congestion is suspected. However, there is no appreciable airspace consolidation or frank pulmonary edema. No evidence of pleural effusion or pneumothorax. No acute bony abnormality identified. Surgical clips within the upper abdomen. IMPRESSION: Cardiomegaly with suspected central pulmonary vascular congestion. No appreciable airspace consolidation or frank pulmonary edema. Electronically Signed   By: Kellie Simmering D.O.   On: 07/28/2021 15:53    Procedures Procedures   Medications Ordered in ED Medications  furosemide (LASIX) tablet 100 mg (has no administration in time range)  AeroChamber Plus Flo-Vu Medium MISC 1 each (1 each Other Given 07/28/21 1952)    ED Course  I have reviewed the triage vital signs and the nursing notes.  Pertinent labs & imaging results that were available during my care of the patient were reviewed by me and considered in my medical decision making (see chart for details).    MDM Rules/Calculators/A&P                         This is a 65 year old female with a past medical history as noted above who has had multiple recent admissions for heart failure exacerbation who is here today with complaints of chest pain and dyspnea on exertion.  On arrival, patient was afebrile and hemodynamically stable.  On exam, there are bibasilar crackles and edema in the bilateral lower extremities.  Labs are notable for BNP 217, troponin x2 flat, creatinine 3.66 (baseline around 2.6).  Chest x-ray shows pulmonary vascular congestion  and cardiomegaly.  EKG showed A. fib with PVCs, overall unchanged from prior last month.  Ordered COVID/flu. Patient with O2 sat down to the 80s and significant shortness of breath while ambulating in the hallway with respiratory therapy.  We have consulted hospitalist for medical management of heart failure exacerbation. Will give 100 mg Lasix x 1 in the ED in the meantime.    Final Clinical Impression(s) / ED Diagnoses Final diagnoses:  None    Rx / DC Orders ED Discharge Orders     None        Orvis Brill, MD 07/28/21 2122    Tegeler, Gwenyth Allegra, MD 07/30/21 [phone]

## 2021-07-28 NOTE — Progress Notes (Signed)
Paramedicine Encounter    Patient ID: [name], female    DOB: 03-21-1956, 65 y.o.   MRN: [id number]   Arrived for home visit for [name] who reports she is having center chest pain with shortness of breath when she moves around with dizziness. She stated her pain is 9/10. She took one nitro at 1130 and had some relief with same. I obtained vitals and noted patient to warm and dry with no diaphoresis. Lungs clear, no edema noted.   [name] reports she has had all of her medications except her Cymbalta in which she is out of. I verified medications and pill box was filled by Hoyle Sauer, it was checked for accuracy.   Weight today at home- [place] was seen in AHF clinic on 11/28 with chest pain, labs and EKG obtained (EKG afib)  She was seen by EP yesterday and EKG was sinus and Amiodarone instructions were $RemoveBeforeDEI'200mg'AtmLiyrSSSAkdzQj$  BID for 7 days, then back to $Remov'200mg'mgVaIg$  once daily.   Patient had called Dr. Harrell Gave earlier for advice and I contacted HF clinic staff, both recommend ER due to persistent chest pain.   I talked with Hoyle Sauer at length about being further evaluated in the ED and she stated she understood but did not want to go sit in the waiting rooms. I explained to her the risks of not being further evaluated and she verbalized understanding and agreed to go later today after she got showered and dressed.    I advised her if she felt necessary to call for EMS if things worsened and she felt unable to go by private vehicle to ED. She agreed with plan.   Home visit complete. I will see [name] in one week.   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   Acute exacerbation of CHF (congestive heart failure) ([place]) 01/29/2021   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   Encounter for preoperative dental examination    Teeth missing    Gingivitis    Accretions on teeth    CHF exacerbation ([place]) 12/21/2020   Acute on chronic heart failure ([place]) 12/19/2020   Atrial fibrillation, permanent ([place]) 11/02/2020   Chronic combined systolic and diastolic heart failure ([place]) 05/06/2020   NICM (nonischemic cardiomyopathy) ([place]) 04/01/2020   Family history of heart disease 04/01/2020   Mitral regurgitation 04/01/2020   Severe tricuspid regurgitation 04/01/2020   Closed right ankle fracture 09/24/2019   DKA, type 2 ([place]) 09/24/2019   [place] (hyperglycemic hyperosmolar nonketotic coma) ([place]) 02/21/2019   Hypokalemia 02/21/2019   Acute kidney injury superimposed on CKD ([place]) 02/21/2019   Acute on chronic left systolic heart failure ([place]) 07/01/2018   Congestive heart failure with left ventricular diastolic dysfunction, acute ([place]) 07/01/2018   Right upper lobe pneumonia 05/14/2018   Atrial fibrillation with RVR ([place]) 05/14/2018   Chronic atrial fibrillation ([place]) 36/62/9476   Diastolic dysfunction 54/65/0354   Diabetes mellitus type 2 in obese ([place]) 12/26/2017  Nausea and vomiting    Chest pain 02/26/2012   OSA (obstructive sleep apnea) 09/21/2011   Hypoxia 09/21/2011   Diabetes mellitus ([place]) 02/04/2010   Morbid obesity ([place]) 02/04/2010   AF (paroxysmal atrial fibrillation) ([place]) 02/04/2010   Gastroparesis 02/04/2010   Acute gastroenteritis 02/04/2010    Current Outpatient Medications:    albuterol (VENTOLIN HFA) 108 (90 Base) MCG/ACT inhaler, Inhale 2 puffs into the lungs every 4 (four) hours as needed (Asthma)., Disp: , Rfl:     Alcohol Swabs (B-D SINGLE USE SWABS REGULAR) PADS, 1 each by Other route daily., Disp: 100 each, Rfl: 11   ALPRAZolam (XANAX) 0.5 MG tablet, Take 1 tablet (0.5 mg total) by mouth 2 (two) times daily as needed for anxiety., Disp: 30 tablet, Rfl: 0   amiodarone (PACERONE) 200 MG tablet, Take 1 tablet (200 mg total) by mouth daily for 7 days, THEN 1 tablet (200 mg total) daily., Disp: 90 tablet, Rfl: 3   apixaban (ELIQUIS) 5 MG TABS tablet, Take 1 tablet (5 mg total) by mouth 2 (two) times daily., Disp: 180 tablet, Rfl: 3   B-D ULTRAFINE III SHORT PEN 31G X 8 MM MISC, 1 each by Other route in the morning, at noon, in the evening, and at bedtime., Disp: , Rfl:    blood glucose meter kit and supplies KIT, Dispense based on patient and insurance preference. Use up to four times daily as directed., Disp: 1 each, Rfl: 0   blood glucose meter kit and supplies, Dispense based on patient and insurance preference. Use up to four times daily as directed. (FOR ICD-10 E10.9, E11.9)., Disp: 1 each, Rfl: 0   Blood Glucose Monitoring Suppl (ACCU-CHEK GUIDE ME) w/Device KIT, Use as instructed to check blood sugar three times daily. E11.69, Disp: 1 kit, Rfl: 0   Blood Glucose Monitoring Suppl (ONETOUCH VERIO) w/Device KIT, Use to check blood sugar TID. E11.69, Disp: 1 kit, Rfl: 0   busPIRone (BUSPAR) 5 MG tablet, Take 1 tablet (5 mg total) by mouth 2 (two) times daily., Disp: 60 tablet, Rfl: 1   colchicine 0.6 MG tablet, Taking 1/2 tablet by mouth daily, Disp: , Rfl:    docusate sodium (COLACE) 250 MG capsule, Take 100 mg by mouth daily., Disp: , Rfl:    Dulaglutide (TRULICITY) 1.5 KF/2.7MD SOPN, Inject 1.5 mg into the skin once a week., Disp: 0.5 mL, Rfl: 3   DULoxetine (CYMBALTA) 60 MG capsule, Take 1 capsule (60 mg total) by mouth daily., Disp: 90 capsule, Rfl: 3   EPINEPHrine 0.3 mg/0.3 mL IJ SOAJ injection, Inject 0.3 mg into the muscle as needed for anaphylaxis., Disp: 1 each, Rfl: 1   fluticasone (FLONASE) 50  MCG/ACT nasal spray, Place 2 sprays into both nostrils daily., Disp: 16 g, Rfl: 6   glucose blood (ACCU-CHEK GUIDE) test strip, Use to check blood sugar TID. E11.69, Disp: 100 each, Rfl: 3   HYDROcodone-acetaminophen (NORCO/VICODIN) 5-325 MG tablet, Take 1 tablet by mouth every 6 (six) hours as needed., Disp: 8 tablet, Rfl: 0   insulin aspart (NOVOLOG) 100 UNIT/ML injection, Inject 12 Units into the skin 3 (three) times daily before meals. For blood sugars 0-199 give 0 units of insulin, 201-250 give 4 units, 251-300 give 6 units, 301-350 give 8 units, 351-400 give 10 units,> 400 give 12 units and call M.D. (Patient taking differently: Inject 5-10 Units into the skin 3 (three) times daily before meals. For blood sugars 0-199 give 0 units of insulin, 201-250 give 4 units, 251-300  give 6 units, 301-350 give 8 units, 351-400 give 10 units,> 400 give 12 units and call M.D.), Disp: 10 mL, Rfl: 3   Lancets (ONETOUCH DELICA PLUS [id number]) MISC, Apply topically 3 (three) times daily., Disp: , Rfl:    LEVEMIR 100 UNIT/ML injection, SMARTSIG:0.4 Milliliter(s) SUB-Q Twice Daily, Disp: , Rfl:    linaclotide (LINZESS) 72 MCG capsule, Patient takes 1 capsule every other day., Disp: , Rfl:    loratadine (CLARITIN) 10 MG tablet, Take 1 tablet (10 mg total) by mouth daily., Disp: 30 tablet, Rfl: 11   metolazone (ZAROXOLYN) 2.5 MG tablet, Take 1 tablet (2.5 mg total) by mouth 2 (two) times a week. Take every Monday and Friday, Disp: 90 tablet, Rfl: 3   metoprolol succinate (TOPROL XL) 25 MG 24 hr tablet, Take 1.5 tablets (37.5 mg total) by mouth daily., Disp: 30 tablet, Rfl: 11   Multiple Vitamins-Minerals (MULTIVITAMIN WOMEN PO), Take 1 tablet by mouth daily., Disp: , Rfl:    nitroGLYCERIN (NITROSTAT) 0.4 MG SL tablet, Place 1 tablet (0.4 mg total) under the tongue every 5 (five) minutes x 3 doses as needed for chest pain., Disp: 25 tablet, Rfl: 12   ondansetron (ZOFRAN ODT) 4 MG disintegrating tablet, Take 1 tablet (4  mg total) by mouth every 8 (eight) hours as needed for nausea or vomiting., Disp: 20 tablet, Rfl: 0   pantoprazole (PROTONIX) 40 MG tablet, TAKE 1 TABLET BY MOUTH TWICE A DAY, Disp: 180 tablet, Rfl: 1   potassium chloride SA (KLOR-CON) 20 MEQ tablet, Take 4 tablets (80 mEq total) by mouth daily. Take an additional 40 meq with every dose of METOLAZONE, Disp: 66 tablet, Rfl: 6   RESTASIS 0.05 % ophthalmic emulsion, Place 1 drop into both eyes 2 (two) times daily., Disp: , Rfl: 12   rosuvastatin (CRESTOR) 10 MG tablet, Take 1 tablet (10 mg total) by mouth daily., Disp: 90 tablet, Rfl: 3   torsemide (DEMADEX) 100 MG tablet, Take 100 mg by mouth daily., Disp: , Rfl:  Allergies  Allergen Reactions   Sulfa Antibiotics Itching     Social History   Socioeconomic History   Marital status: Significant Other    Spouse name: Not on file   Number of children: 2   Years of education: Not on file   Highest education level: Some college, no degree  Occupational History   Not on file  Tobacco Use   Smoking status: Never   Smokeless tobacco: Never  Vaping Use   Vaping Use: Never used  Substance and Sexual Activity   Alcohol use: No   Drug use: No   Sexual activity: Not Currently  Other Topics Concern   Not on file  Social History Narrative   Pt lives in [place] with spouse.  2 grown children.   Previously worked in [employer] at [employer].  Now on disability   Social Determinants of Health   Financial Resource Strain: Low Risk    Difficulty of Paying Living Expenses: Not very hard  Food Insecurity: No Food Insecurity   Worried About Charity fundraiser in the Last Year: Never true   Ran Out of Food in the Last Year: Never true  Transportation Needs: No Transportation Needs   Lack of Transportation (Medical): No   Lack of Transportation (Non-Medical): No  Physical Activity: Not on file  Stress: Not on file  Social Connections: Not on file  Intimate Partner Violence: Not on file     Physical Exam Vitals reviewed.  Constitutional:      Appearance: Normal appearance. She is normal weight.  HENT:     Head: Normocephalic.     Nose: Nose normal.     Mouth/Throat:     Mouth: Mucous membranes are moist.     Pharynx: Oropharynx is clear.  Eyes:     Conjunctiva/sclera: Conjunctivae normal.     Pupils: Pupils are equal, round, and reactive to light.  Cardiovascular:     Rate and Rhythm: Normal rate and regular rhythm.     Pulses: Normal pulses.     Heart sounds: Normal heart sounds.     Comments: 9/10 center chest pain, radiating through to her back.  Pulmonary:     Effort: Pulmonary effort is normal.     Breath sounds: Normal breath sounds.  Abdominal:     General: Abdomen is flat.     Palpations: Abdomen is soft.  Musculoskeletal:        General: No swelling. Normal range of motion.     Cervical back: Normal range of motion.     Right lower leg: No edema.     Left lower leg: No edema.  Skin:    General: Skin is warm and dry.     Capillary Refill: Capillary refill takes less than 2 seconds.  Neurological:     General: No focal deficit present.     Mental Status: She is alert. Mental status is at baseline.        Future Appointments  Date Time Provider [place]  08/03/2021  9:00 AM [facility] LAB [facility] None  08/24/2021  9:50 AM [name], NP [facility] None  08/24/2021  2:00 PM [facility] PA/NP [facility] None  10/03/2021  9:00 AM Buford Dresser, MD [facility]     ACTION: Home visit completed

## 2021-07-28 NOTE — ED Notes (Signed)
Pt w/hx of asthma/COPD. Pt assessed and educated on use of spacer w/her home MDI medication for better deposition of medication. This will also help w/better relief of symptoms when they occur. RT suggested pt seek PFT/pulmonoloy visit for her asthma/COPD. Pt verbalized understanding, no questions at this time.

## 2021-07-28 NOTE — ED Notes (Signed)
RT abulated pt in dept on RA w/Sats of 92% w/exertion. Pt became SOB/tachypneic during ambulation. Pt needed to stop and rest x2 during ambulation. Upon rest pts sats returned to 97%-100% on RA. Pt respiratory status stable w/SOB during ambulation. RT will continue to monitor. MD notified.

## 2021-07-28 NOTE — Telephone Encounter (Signed)
Spoke to [place] an EMT through the heart failure clinic. She report pt was seen by HF clinic on 11/28 and at that time reported c/o sob with exertion and CP. She report pt took nitro but it didn't help. At 11/28 visit, pt was evaluated and referred to Dr. Quentin Ore for afib.  Pt completed appointment with Dr. Quentin Ore yesterday 12/7 and was found to be back in NSR. Dr. Quentin Ore recommended to continue amiodarone 200 mg BID x 7 days then decrease to 200 mg daily. Today, Heather EMT report pt is c/o 9/10 sharp chest pain that is radiating to her back.   Based on current symptoms, nurse recommended to report to ER for further evaluations. Heather verbalized understanding and will make pt aware.

## 2021-07-28 NOTE — ED Triage Notes (Signed)
Pt arrives to ED with c/o chest pain. She reports this started x3 weeks ago. Associated symptoms include SOB, DOE. The chest pain is constant and has worsened today. The pain is described as sharp. The pain radiates to her mid back. No N/V/D. Pain 9/10.

## 2021-07-28 NOTE — Telephone Encounter (Signed)
Pt states that she was instructed by Dr. Harrell Gave that if she had any additional issues to reach out and Dr. Harrell Gave would return her call.. please advise

## 2021-07-29 ENCOUNTER — Other Ambulatory Visit: Payer: Self-pay

## 2021-07-29 ENCOUNTER — Ambulatory Visit (HOSPITAL_COMMUNITY)
Admission: EM | Disposition: A | Discharge status: Home / Self Care | Payer: Self-pay | Source: Home / Self Care | Attending: Emergency Medicine

## 2021-07-29 ENCOUNTER — Observation Stay (HOSPITAL_BASED_OUTPATIENT_CLINIC_OR_DEPARTMENT_OTHER): Payer: Medicare Other

## 2021-07-29 DIAGNOSIS — E876 Hypokalemia: Secondary | ICD-10-CM

## 2021-07-29 DIAGNOSIS — I5042 Chronic combined systolic (congestive) and diastolic (congestive) heart failure: Secondary | ICD-10-CM | POA: Diagnosis not present

## 2021-07-29 DIAGNOSIS — Z794 Long term (current) use of insulin: Secondary | ICD-10-CM | POA: Diagnosis not present

## 2021-07-29 DIAGNOSIS — R071 Chest pain on breathing: Secondary | ICD-10-CM

## 2021-07-29 DIAGNOSIS — I480 Paroxysmal atrial fibrillation: Secondary | ICD-10-CM | POA: Diagnosis not present

## 2021-07-29 DIAGNOSIS — N179 Acute kidney failure, unspecified: Secondary | ICD-10-CM

## 2021-07-29 DIAGNOSIS — I259 Chronic ischemic heart disease, unspecified: Secondary | ICD-10-CM

## 2021-07-29 DIAGNOSIS — N184 Chronic kidney disease, stage 4 (severe): Secondary | ICD-10-CM | POA: Diagnosis not present

## 2021-07-29 DIAGNOSIS — Z7901 Long term (current) use of anticoagulants: Secondary | ICD-10-CM | POA: Diagnosis not present

## 2021-07-29 DIAGNOSIS — R9431 Abnormal electrocardiogram [ECG] [EKG]: Secondary | ICD-10-CM | POA: Diagnosis present

## 2021-07-29 DIAGNOSIS — R079 Chest pain, unspecified: Secondary | ICD-10-CM

## 2021-07-29 DIAGNOSIS — I5032 Chronic diastolic (congestive) heart failure: Secondary | ICD-10-CM

## 2021-07-29 DIAGNOSIS — R0602 Shortness of breath: Secondary | ICD-10-CM | POA: Diagnosis not present

## 2021-07-29 DIAGNOSIS — E1122 Type 2 diabetes mellitus with diabetic chronic kidney disease: Secondary | ICD-10-CM

## 2021-07-29 DIAGNOSIS — J449 Chronic obstructive pulmonary disease, unspecified: Secondary | ICD-10-CM | POA: Diagnosis present

## 2021-07-29 DIAGNOSIS — N189 Chronic kidney disease, unspecified: Secondary | ICD-10-CM

## 2021-07-29 DIAGNOSIS — N1832 Chronic kidney disease, stage 3b: Secondary | ICD-10-CM

## 2021-07-29 DIAGNOSIS — R112 Nausea with vomiting, unspecified: Secondary | ICD-10-CM

## 2021-07-29 DIAGNOSIS — J45909 Unspecified asthma, uncomplicated: Secondary | ICD-10-CM | POA: Diagnosis not present

## 2021-07-29 DIAGNOSIS — G4733 Obstructive sleep apnea (adult) (pediatric): Secondary | ICD-10-CM

## 2021-07-29 DIAGNOSIS — E119 Type 2 diabetes mellitus without complications: Secondary | ICD-10-CM | POA: Diagnosis not present

## 2021-07-29 DIAGNOSIS — Z20822 Contact with and (suspected) exposure to covid-19: Secondary | ICD-10-CM | POA: Diagnosis not present

## 2021-07-29 DIAGNOSIS — Z79899 Other long term (current) drug therapy: Secondary | ICD-10-CM | POA: Diagnosis not present

## 2021-07-29 HISTORY — PX: RIGHT HEART CATH: CATH118263

## 2021-07-29 LAB — POCT I-STAT EG7
Acid-Base Excess: 8 mmol/L — ABNORMAL HIGH (ref 0.0–2.0)
Acid-Base Excess: 9 mmol/L — ABNORMAL HIGH (ref 0.0–2.0)
Bicarbonate: 34.5 mmol/L — ABNORMAL HIGH (ref 20.0–28.0)
Bicarbonate: 36 mmol/L — ABNORMAL HIGH (ref 20.0–28.0)
Calcium, Ion: 1.13 mmol/L — ABNORMAL LOW (ref 1.15–1.40)
Calcium, Ion: 1.19 mmol/L (ref 1.15–1.40)
HCT: 36 % (ref 36.0–46.0)
HCT: 37 % (ref 36.0–46.0)
Hemoglobin: 12.2 g/dL (ref 12.0–15.0)
Hemoglobin: 12.6 g/dL (ref 12.0–15.0)
O2 Saturation: 52 %
O2 Saturation: 55 %
Potassium: 3.1 mmol/L — ABNORMAL LOW (ref 3.5–5.1)
Potassium: 3.3 mmol/L — ABNORMAL LOW (ref 3.5–5.1)
Sodium: 141 mmol/L (ref 135–145)
Sodium: 142 mmol/L (ref 135–145)
TCO2: 36 mmol/L — ABNORMAL HIGH (ref 22–32)
TCO2: 38 mmol/L — ABNORMAL HIGH (ref 22–32)
pCO2, Ven: 58.2 mmHg (ref 44.0–60.0)
pCO2, Ven: 61.4 mmHg — ABNORMAL HIGH (ref 44.0–60.0)
pH, Ven: 7.377 (ref 7.250–7.430)
pH, Ven: 7.38 (ref 7.250–7.430)
pO2, Ven: 29 mmHg — CL (ref 32.0–45.0)
pO2, Ven: 31 mmHg — CL (ref 32.0–45.0)

## 2021-07-29 LAB — GLUCOSE, CAPILLARY
Glucose-Capillary: 135 mg/dL — ABNORMAL HIGH (ref 70–99)
Glucose-Capillary: 178 mg/dL — ABNORMAL HIGH (ref 70–99)

## 2021-07-29 LAB — BASIC METABOLIC PANEL
Anion gap: 11 (ref 5–15)
BUN: 55 mg/dL — ABNORMAL HIGH (ref 8–23)
CO2: 33 mmol/L — ABNORMAL HIGH (ref 22–32)
Calcium: 9.7 mg/dL (ref 8.9–10.3)
Chloride: 94 mmol/L — ABNORMAL LOW (ref 98–111)
Creatinine, Ser: 3.73 mg/dL — ABNORMAL HIGH (ref 0.44–1.00)
GFR, Estimated: 13 mL/min — ABNORMAL LOW (ref >60)
Glucose, Bld: 163 mg/dL — ABNORMAL HIGH (ref 70–99)
Potassium: 3.4 mmol/L — ABNORMAL LOW (ref 3.5–5.1)
Sodium: 138 mmol/L (ref 135–145)

## 2021-07-29 LAB — URINALYSIS, ROUTINE W REFLEX MICROSCOPIC
Bilirubin Urine: NEGATIVE
Glucose, UA: NEGATIVE mg/dL
Hgb urine dipstick: NEGATIVE
Ketones, ur: NEGATIVE mg/dL
Leukocytes,Ua: NEGATIVE
Nitrite: NEGATIVE
Protein, ur: NEGATIVE mg/dL
Specific Gravity, Urine: 1.02 (ref 1.005–1.030)
pH: 6 (ref 5.0–8.0)

## 2021-07-29 LAB — ECHOCARDIOGRAM COMPLETE
AR max vel: 2.53 cm2
AV Peak grad: 8.2 mmHg
Ao pk vel: 1.43 m/s
Area-P 1/2: 2.19 cm2
Calc EF: 43.7 %
Height: 67 in
MV VTI: 1.58 cm2
S' Lateral: 3.8 cm
Single Plane A2C EF: 43 %
Single Plane A4C EF: 43.7 %
Weight: 4402.15 oz

## 2021-07-29 LAB — CBG MONITORING, ED: Glucose-Capillary: 135 mg/dL — ABNORMAL HIGH (ref 70–99)

## 2021-07-29 LAB — CREATININE, URINE, RANDOM: Creatinine, Urine: 97.28 mg/dL

## 2021-07-29 SURGERY — RIGHT HEART CATH
Anesthesia: LOCAL

## 2021-07-29 MED ORDER — SODIUM CHLORIDE 0.9% FLUSH: 3.0000 mL | INTRAVENOUS | Status: DC | PRN

## 2021-07-29 MED ORDER — NITROGLYCERIN 0.4 MG SL SUBL: 0.4000 mg | SUBLINGUAL_TABLET | SUBLINGUAL | Status: DC | PRN

## 2021-07-29 MED ORDER — DULOXETINE HCL 60 MG PO CPEP
60.0000 mg | ORAL_CAPSULE | Freq: Every day | ORAL | Status: DC
  Administered 2021-07-29 – 2021-07-31 (×3): 60 mg via ORAL
  Filled 2021-07-29 (×3): qty 1

## 2021-07-29 MED ORDER — ACETAMINOPHEN 650 MG RE SUPP: 650.0000 mg | Freq: Four times a day (QID) | RECTAL | Status: DC | PRN

## 2021-07-29 MED ORDER — ALBUTEROL SULFATE (2.5 MG/3ML) 0.083% IN NEBU: 2.5000 mg | INHALATION_SOLUTION | Freq: Four times a day (QID) | RESPIRATORY_TRACT | Status: DC | PRN

## 2021-07-29 MED ORDER — POTASSIUM CHLORIDE CRYS ER 20 MEQ PO TBCR
40.0000 meq | EXTENDED_RELEASE_TABLET | Freq: Once | ORAL | Status: AC
  Administered 2021-07-29: 40 meq via ORAL
  Filled 2021-07-29: qty 2

## 2021-07-29 MED ORDER — PANTOPRAZOLE SODIUM 40 MG PO TBEC
40.0000 mg | DELAYED_RELEASE_TABLET | Freq: Once | ORAL | Status: AC
  Administered 2021-07-30: 40 mg via ORAL
  Filled 2021-07-29: qty 1

## 2021-07-29 MED ORDER — METOCLOPRAMIDE HCL 5 MG/ML IJ SOLN
10.0000 mg | Freq: Once | INTRAMUSCULAR | Status: AC
  Administered 2021-07-29: 10 mg via INTRAVENOUS
  Filled 2021-07-29: qty 2

## 2021-07-29 MED ORDER — LIDOCAINE HCL (PF) 1 % IJ SOLN
INTRAMUSCULAR | Status: AC
  Filled 2021-07-29: qty 30

## 2021-07-29 MED ORDER — APIXABAN 5 MG PO TABS
5.0000 mg | ORAL_TABLET | Freq: Two times a day (BID) | ORAL | Status: DC
  Administered 2021-07-29 – 2021-07-31 (×5): 5 mg via ORAL
  Filled 2021-07-29 (×5): qty 1

## 2021-07-29 MED ORDER — SODIUM CHLORIDE 0.9 % IV SOLN: INTRAVENOUS | Status: DC

## 2021-07-29 MED ORDER — FLUTICASONE PROPIONATE 50 MCG/ACT NA SUSP
2.0000 | Freq: Every day | NASAL | Status: DC | PRN
  Filled 2021-07-29: qty 16

## 2021-07-29 MED ORDER — FENTANYL CITRATE (PF) 100 MCG/2ML IJ SOLN
INTRAMUSCULAR | Status: DC | PRN
  Administered 2021-07-29: 25 ug via INTRAVENOUS

## 2021-07-29 MED ORDER — DIPHENHYDRAMINE HCL 50 MG/ML IJ SOLN
25.0000 mg | Freq: Once | INTRAMUSCULAR | Status: AC
  Administered 2021-07-29: 25 mg via INTRAVENOUS
  Filled 2021-07-29: qty 1

## 2021-07-29 MED ORDER — MORPHINE SULFATE (PF) 2 MG/ML IV SOLN
2.0000 mg | INTRAVENOUS | Status: DC | PRN
  Administered 2021-07-29 – 2021-07-30 (×2): 2 mg via INTRAVENOUS
  Filled 2021-07-29 (×3): qty 1

## 2021-07-29 MED ORDER — BUSPIRONE HCL 5 MG PO TABS
5.0000 mg | ORAL_TABLET | Freq: Two times a day (BID) | ORAL | Status: DC
  Administered 2021-07-29 – 2021-07-31 (×5): 5 mg via ORAL
  Filled 2021-07-29 (×5): qty 1

## 2021-07-29 MED ORDER — LIDOCAINE HCL (PF) 1 % IJ SOLN
INTRAMUSCULAR | Status: DC | PRN
  Administered 2021-07-29: 2 mL

## 2021-07-29 MED ORDER — ASPIRIN 81 MG PO CHEW: 81.0000 mg | CHEWABLE_TABLET | ORAL | Status: DC

## 2021-07-29 MED ORDER — MIDAZOLAM HCL 2 MG/2ML IJ SOLN
INTRAMUSCULAR | Status: AC
  Filled 2021-07-29: qty 2

## 2021-07-29 MED ORDER — DOCUSATE SODIUM 100 MG PO CAPS
100.0000 mg | ORAL_CAPSULE | Freq: Two times a day (BID) | ORAL | Status: DC
  Administered 2021-07-29 – 2021-07-31 (×5): 100 mg via ORAL
  Filled 2021-07-29 (×5): qty 1

## 2021-07-29 MED ORDER — CYCLOSPORINE 0.05 % OP EMUL
1.0000 [drp] | Freq: Two times a day (BID) | OPHTHALMIC | Status: DC
  Administered 2021-07-29 – 2021-07-31 (×5): 1 [drp] via OPHTHALMIC
  Filled 2021-07-29 (×7): qty 30

## 2021-07-29 MED ORDER — AMIODARONE HCL 200 MG PO TABS
200.0000 mg | ORAL_TABLET | Freq: Two times a day (BID) | ORAL | Status: DC
  Administered 2021-07-29 – 2021-07-31 (×5): 200 mg via ORAL
  Filled 2021-07-29 (×5): qty 1

## 2021-07-29 MED ORDER — INSULIN ASPART 100 UNIT/ML IJ SOLN
0.0000 [IU] | Freq: Three times a day (TID) | INTRAMUSCULAR | Status: DC
  Administered 2021-07-29: 1 [IU] via SUBCUTANEOUS
  Administered 2021-07-30 (×2): 2 [IU] via SUBCUTANEOUS
  Administered 2021-07-30 – 2021-07-31 (×3): 1 [IU] via SUBCUTANEOUS

## 2021-07-29 MED ORDER — SODIUM CHLORIDE 0.9 % IV SOLN
INTRAVENOUS | Status: DC | PRN
  Administered 2021-07-29: 10 mL/h via INTRAVENOUS

## 2021-07-29 MED ORDER — ALPRAZOLAM 0.5 MG PO TABS
0.5000 mg | ORAL_TABLET | Freq: Two times a day (BID) | ORAL | Status: DC
  Administered 2021-07-29 – 2021-07-31 (×5): 0.5 mg via ORAL
  Filled 2021-07-29 (×5): qty 1

## 2021-07-29 MED ORDER — MIDAZOLAM HCL 2 MG/2ML IJ SOLN
INTRAMUSCULAR | Status: DC | PRN
  Administered 2021-07-29: 1 mg via INTRAVENOUS

## 2021-07-29 MED ORDER — DULAGLUTIDE 1.5 MG/0.5ML ~~LOC~~ SOAJ: 1.5000 mg | SUBCUTANEOUS | Status: DC

## 2021-07-29 MED ORDER — SODIUM CHLORIDE 0.9% FLUSH: 3.0000 mL | Freq: Two times a day (BID) | INTRAVENOUS | Status: DC

## 2021-07-29 MED ORDER — ONDANSETRON HCL 4 MG/2ML IJ SOLN
4.0000 mg | Freq: Once | INTRAMUSCULAR | Status: AC
  Administered 2021-07-29: 4 mg via INTRAVENOUS
  Filled 2021-07-29: qty 2

## 2021-07-29 MED ORDER — HEPARIN (PORCINE) IN NACL 1000-0.9 UT/500ML-% IV SOLN
INTRAVENOUS | Status: DC | PRN
  Administered 2021-07-29: 500 mL

## 2021-07-29 MED ORDER — SODIUM CHLORIDE 0.9% FLUSH
3.0000 mL | Freq: Two times a day (BID) | INTRAVENOUS | Status: DC
  Administered 2021-07-29 – 2021-07-31 (×5): 3 mL via INTRAVENOUS

## 2021-07-29 MED ORDER — ROSUVASTATIN CALCIUM 5 MG PO TABS
10.0000 mg | ORAL_TABLET | Freq: Every evening | ORAL | Status: DC
  Administered 2021-07-29 – 2021-07-30 (×2): 10 mg via ORAL
  Filled 2021-07-29 (×2): qty 2

## 2021-07-29 MED ORDER — SODIUM CHLORIDE 0.9 % IV SOLN: 250.0000 mL | INTRAVENOUS | Status: DC | PRN

## 2021-07-29 MED ORDER — MORPHINE SULFATE (PF) 4 MG/ML IV SOLN
4.0000 mg | Freq: Once | INTRAVENOUS | Status: AC
  Administered 2021-07-29: 4 mg via INTRAVENOUS
  Filled 2021-07-29: qty 1

## 2021-07-29 MED ORDER — HEPARIN SODIUM (PORCINE) 1000 UNIT/ML IJ SOLN
INTRAMUSCULAR | Status: AC
  Filled 2021-07-29: qty 10

## 2021-07-29 MED ORDER — FUROSEMIDE 10 MG/ML IJ SOLN
100.0000 mg | Freq: Once | INTRAVENOUS | Status: DC
  Filled 2021-07-29: qty 10

## 2021-07-29 MED ORDER — ACETAMINOPHEN 325 MG PO TABS
650.0000 mg | ORAL_TABLET | Freq: Four times a day (QID) | ORAL | Status: DC | PRN
  Administered 2021-07-30: 650 mg via ORAL
  Filled 2021-07-29: qty 2

## 2021-07-29 MED ORDER — METOPROLOL SUCCINATE ER 25 MG PO TB24
37.5000 mg | ORAL_TABLET | Freq: Every day | ORAL | Status: DC
  Administered 2021-07-29 – 2021-07-31 (×3): 37.5 mg via ORAL
  Filled 2021-07-29 (×3): qty 2

## 2021-07-29 MED ORDER — HEPARIN (PORCINE) IN NACL 1000-0.9 UT/500ML-% IV SOLN
INTRAVENOUS | Status: AC
  Filled 2021-07-29: qty 1000

## 2021-07-29 MED ORDER — FENTANYL CITRATE (PF) 100 MCG/2ML IJ SOLN
INTRAMUSCULAR | Status: AC
  Filled 2021-07-29: qty 2

## 2021-07-29 MED ORDER — LORATADINE 10 MG PO TABS
10.0000 mg | ORAL_TABLET | Freq: Every day | ORAL | Status: DC
  Administered 2021-07-29 – 2021-07-31 (×3): 10 mg via ORAL
  Filled 2021-07-29 (×3): qty 1

## 2021-07-29 MED ORDER — ALUM & MAG HYDROXIDE-SIMETH 200-200-20 MG/5ML PO SUSP: 15.0000 mL | Freq: Once | ORAL | Status: DC

## 2021-07-29 SURGICAL SUPPLY — 8 items
CATH BALLN WEDGE 5F 110CM (CATHETERS) ×2 IMPLANT
KIT HEART LEFT (KITS) ×2 IMPLANT
MAT PREVALON FULL STRYKER (MISCELLANEOUS) ×2 IMPLANT
PACK CARDIAC CATHETERIZATION (CUSTOM PROCEDURE TRAY) ×2 IMPLANT
SHEATH GLIDE SLENDER 4/5FR (SHEATH) ×2 IMPLANT
TRANSDUCER W/STOPCOCK (MISCELLANEOUS) ×2 IMPLANT
TUBING CIL FLEX 10 FLL-RA (TUBING) IMPLANT
WIRE MICRO SET SILHO 5FR 7 (SHEATH) ×2 IMPLANT

## 2021-07-29 NOTE — Progress Notes (Signed)
Patient returned to unit from cardiac cath. No complaints at this time. Family at bedside, Current vitals are as follows:   07/29/21 1714  Vitals  Temp Source Oral  BP 100/63  MAP (mmHg) 71  BP Location Right Arm  BP Method Automatic  Patient Position (if appropriate) Lying  Pulse Rate 90  Pulse Rate Source Monitor  ECG Heart Rate 93  Resp 19  Level of Consciousness  Level of Consciousness Alert  MEWS COLOR  MEWS Score Color Green  Oxygen Therapy  SpO2 97 %  O2 Device Room Air  Patient Activity (if Appropriate) In bed  Pulse Oximetry Type Continuous  MEWS Score  MEWS Temp 0  MEWS Systolic 1  MEWS Pulse 0  MEWS RR 0  MEWS LOC 0  MEWS Score 1

## 2021-07-29 NOTE — ED Notes (Signed)
Pt stated that her pain was a 9 last night and at approx 0300 she was given medication for same. Pt stated that her pain decreased to an 8 and has remained there since.  Pt stated that she did feel better than last night. Pt did not bring up and pain and only commented on it when asked by this RN. Sh appereaed be resting well and has a calm demeanor.

## 2021-07-29 NOTE — H&P (Addendum)
History and Physical    [name] WNI:[id number] DOB: [birth year] DOA: 07/28/2021  Referring MD/NP/PA: Gean Birchwood, MD PCP: [name], NP  Patient coming from: [facility]  Chief Complaint: Chest pain and shortness of breath  I have personally briefly reviewed patient's old medical records in [place]   HPI: [name] is a 65 y.o. 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  She notes symptoms have been present now for multiple weeks.  Complains of having a left substernal chest pain that she describes as sharp and pressure that feels like an elephant is sitting on her chest.  She rates pain as a 8 out of 10 on the pain scale.  Shortness of breath is worsened with exertion or whenever she is laying flat.  States that she has been abiding by the fluid restriction, but her weight has been fluctuating between 282 -286 pounds, but previously had been around 266 after open heart surgery in May of this year.  Associated symptoms have included lower extremity swelling unchanged, palpitations, and 3 episodes of emesis.  Denies having any significant cough, wheezing, fever, dysuria, abdominal pain, or diarrhea.  Patient reported trying to take Tylenol as well as nitroglycerin without relief in chest pain.  She had been seen last by Dr. Quentin Ore 2 days ago, and was recommended to continue on amiodarone twice daily for another week prior to dropping back down to 200 mg daily.  Review of records note that if she continued to have struggles with maintaining normal rhythm discussions about pacemaker/AV ablation in the future.  ED Course: Upon admission into the emergency department patient was seen  to be afebrile, pulse 55-94, respirations 16-26, blood pressures 91/74-142/60, and O2 saturations currently maintained on room air.  Labs significant for potassium 3.4 BUN 31, creatinine 3.66, BNP 217.4, and high-sensitivity troponin negative x2.  Cardiomegaly with suspected central pulmonary vascular congestion without consolidation or frank edema.  COVID-19 and influenza screening were negative.  Patient had initially been given Lasix 100 mg p.o. milligrams IV, Zofran, Reglan, and Benadryl.  Review of Systems  Constitutional:  Positive for malaise/fatigue. Negative for fever.  HENT:  Negative for congestion and nosebleeds.   Eyes:  Negative for photophobia and pain.  Respiratory:  Positive for shortness of breath. Negative for cough and wheezing.   Cardiovascular:  Positive for chest pain, palpitations, orthopnea and leg swelling.  Gastrointestinal:  Positive for vomiting. Negative for abdominal pain and diarrhea.  Genitourinary:  Negative for dysuria and frequency.  Musculoskeletal:  Negative for falls.  Skin:  Negative for rash.  Neurological:  Negative for focal weakness and loss of consciousness.  Psychiatric/Behavioral:  Negative for substance abuse.    Past Medical History:  Diagnosis Date   Allergic rhinitis    Arthritis    Asthma    Chest pain 12/27/2017   Chronic diastolic CHF (congestive heart failure) (HCC)    COPD (chronic obstructive pulmonary disease) (HCC)    Depression    DM (diabetes mellitus) ([place])    DVT (deep vein thrombosis) in pregnancy    Dysrhythmia    atrial fibrilation   GERD (gastroesophageal reflux disease)    Headache(784.0)    HTN (hypertension)    Hyperlipidemia    Obesity    Pneumonia 04/2018  RIGHT LOBE   Shortness of breath    Sleep apnea    compliant with CPAP    Past Surgical History:  Procedure Laterality Date   ABDOMINAL HYSTERECTOMY     BUBBLE STUDY  11/26/2020   Procedure: BUBBLE STUDY;  Surgeon: Buford Dresser, MD;   Location: [place];  Service: Cardiovascular;;   CARDIOVERSION N/A 05/06/2020   Procedure: CARDIOVERSION;  Surgeon: Buford Dresser, MD;  Location: [place];  Service: Cardiovascular;  Laterality: N/A;   CARDIOVERSION N/A 11/04/2020   Procedure: CARDIOVERSION;  Surgeon: Lelon Perla, MD;  Location: [facility] ENDOSCOPY;  Service: Cardiovascular;  Laterality: N/A;   CARDIOVERSION N/A 02/01/2021   Procedure: CARDIOVERSION;  Surgeon: Jolaine Artist, MD;  Location: [facility] ENDOSCOPY;  Service: Cardiovascular;  Laterality: N/A;   CARDIOVERSION N/A 02/09/2021   Procedure: CARDIOVERSION;  Surgeon: Jolaine Artist, MD;  Location: [facility] ENDOSCOPY;  Service: Cardiovascular;  Laterality: N/A;   CARDIOVERSION N/A 04/19/2021   Procedure: CARDIOVERSION;  Surgeon: Fay Records, MD;  Location: [place];  Service: Cardiovascular;  Laterality: N/A;   CATARACT EXTRACTION, BILATERAL     CHOLECYSTECTOMY     COLONOSCOPY WITH PROPOFOL N/A 03/05/2015   Procedure: COLONOSCOPY WITH PROPOFOL;  Surgeon: Carol Ada, MD;  Location: [facility] ENDOSCOPY;  Service: Endoscopy;  Laterality: N/A;   DIAGNOSTIC LAPAROSCOPY     ingrown hallux Left    KNEE SURGERY     LEFT HEART CATH AND CORONARY ANGIOGRAPHY N/A 12/31/2017   Procedure: LEFT HEART CATH AND CORONARY ANGIOGRAPHY;  Surgeon: Charolette Forward, MD;  Location: [place] CV LAB;  Service: Cardiovascular;  Laterality: N/A;   MAZE N/A 12/29/2020   Procedure: MAZE;  Surgeon: [name], MD;  Location: [place];  Service: Open Heart Surgery;  Laterality: N/A;   MITRAL VALVE REPAIR N/A 12/29/2020   Procedure: MITRAL VALVE REPAIR USING CARBOMEDICS ANNULOFLEX RING SIZE 30;  Surgeon: [name], MD;  Location: [place];  Service: Open Heart Surgery;  Laterality: N/A;   RIGHT HEART CATH N/A 12/22/2020   Procedure: RIGHT HEART CATH;  Surgeon: Larey Dresser, MD;  Location: [place] CV LAB;  Service: Cardiovascular;  Laterality: N/A;   RIGHT HEART CATH N/A  01/31/2021   Procedure: RIGHT HEART CATH;  Surgeon: Jolaine Artist, MD;  Location: [place] CV LAB;  Service: Cardiovascular;  Laterality: N/A;   TEE WITHOUT CARDIOVERSION N/A 11/26/2020   Procedure: TRANSESOPHAGEAL ECHOCARDIOGRAM (TEE);  Surgeon: Buford Dresser, MD;  Location: [facility] ENDOSCOPY;  Service: Cardiovascular;  Laterality: N/A;   TEE WITHOUT CARDIOVERSION N/A 12/29/2020   Procedure: TRANSESOPHAGEAL ECHOCARDIOGRAM (TEE);  Surgeon: [name], MD;  Location: [place];  Service: Open Heart Surgery;  Laterality: N/A;   TRICUSPID VALVE REPLACEMENT N/A 12/29/2020   Procedure: 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 RING SIZE 34;  Surgeon: [name], MD;  Location: [place];  Service: Open Heart Surgery;  Laterality: N/A;     reports that she has never smoked. She has never used smokeless tobacco. She reports that she does not drink alcohol and does not use drugs.  Allergies  Allergen Reactions   Sulfa Antibiotics Itching    Family History  Problem Relation Age of Onset   Cancer Mother    Hypertension Mother    Cancer Father    CAD Other    Hypertension Sister    Hypertension Brother     Prior to Admission medications   Medication Sig Start Date End Date Taking? Authorizing Provider  albuterol (  VENTOLIN HFA) 108 (90 Base) MCG/ACT inhaler Inhale 2 puffs into the lungs every 4 (four) hours as needed (Asthma). 08/15/19   [provider]  Alcohol Swabs (B-D SINGLE USE SWABS REGULAR) PADS 1 each by Other route daily. 07/06/21   [name], NP  ALPRAZolam Duanne Moron) 0.5 MG tablet Take 1 tablet (0.5 mg total) by mouth 2 (two) times daily as needed for anxiety. 02/15/21   [name], NP  amiodarone (PACERONE) 200 MG tablet Take 1 tablet (200 mg total) by mouth daily for 7 days, THEN 1 tablet (200 mg total) daily. 07/27/21 08/03/22  Vickie Epley, MD  apixaban (ELIQUIS) 5 MG TABS tablet Take 1 tablet (5 mg total) by mouth 2  (two) times daily. 05/16/21   [place], Maricela Bo, FNP  B-D ULTRAFINE III SHORT PEN 31G X 8 MM MISC 1 each by Other route in the morning, at noon, in the evening, and at bedtime. 11/18/20   [provider]  blood glucose meter kit and supplies KIT Dispense based on patient and insurance preference. Use up to four times daily as directed. 02/10/21   Patrecia Pour, MD  blood glucose meter kit and supplies Dispense based on patient and insurance preference. Use up to four times daily as directed. (FOR ICD-10 E10.9, E11.9). 07/06/21   [name], NP  Blood Glucose Monitoring Suppl (ACCU-CHEK GUIDE ME) w/Device KIT Use as instructed to check blood sugar three times daily. E11.69 04/21/20   Charlott Rakes, MD  Blood Glucose Monitoring Suppl (ONETOUCH VERIO) w/Device KIT Use to check blood sugar TID. E11.69 Patient not taking: Reported on 07/28/2021 02/04/21   Charlott Rakes, MD  busPIRone (BUSPAR) 5 MG tablet Take 1 tablet (5 mg total) by mouth 2 (two) times daily. 07/04/21   [name], NP  colchicine 0.6 MG tablet Taking 1/2 tablet by mouth daily    [provider]  docusate sodium (COLACE) 250 MG capsule Take 100 mg by mouth daily.    [provider]  Dulaglutide (TRULICITY) 1.5 KC/1.2XN SOPN Inject 1.5 mg into the skin once a week. 06/14/21   [name], NP  DULoxetine (CYMBALTA) 60 MG capsule Take 1 capsule (60 mg total) by mouth daily. 06/14/21   [name], NP  EPINEPHrine 0.3 mg/0.3 mL IJ SOAJ injection Inject 0.3 mg into the muscle as needed for anaphylaxis. 06/25/20   [name], NP  fluticasone (FLONASE) 50 MCG/ACT nasal spray Place 2 sprays into both nostrils daily. 05/24/21   [name], NP  glucose blood (ACCU-CHEK GUIDE) test strip Use to check blood sugar TID. E11.69 07/13/21   Charlott Rakes, MD  HYDROcodone-acetaminophen (NORCO/VICODIN) 5-325 MG tablet Take 1 tablet by mouth every 6 (six) hours as needed. Patient  not taking: Reported on 07/28/2021 06/21/21   Valarie Merino, MD  insulin aspart (NOVOLOG) 100 UNIT/ML injection Inject 12 Units into the skin 3 (three) times daily before meals. For blood sugars 0-199 give 0 units of insulin, 201-250 give 4 units, 251-300 give 6 units, 301-350 give 8 units, 351-400 give 10 units,> 400 give 12 units and call M.D. Patient taking differently: Inject 5-10 Units into the skin 3 (three) times daily before meals. For blood sugars 0-199 give 0 units of insulin, 201-250 give 4 units, 251-300 give 6 units, 301-350 give 8 units, 351-400 give 10 units,> 400 give 12 units and call M.D. 06/24/20   [name], NP  Lancets (ONETOUCH DELICA PLUS [id number])  MISC Apply topically 3 (three) times daily. 07/07/21   [provider]  LEVEMIR 100 UNIT/ML injection SMARTSIG:0.4 Milliliter(s) SUB-Q Twice Daily Patient not taking: Reported on 07/28/2021 07/03/21   [provider]  linaclotide Rolan Lipa) 72 MCG capsule Patient takes 1 capsule every other day.    [provider]  loratadine (CLARITIN) 10 MG tablet Take 1 tablet (10 mg total) by mouth daily. 05/24/21   [name], NP  metolazone (ZAROXOLYN) 2.5 MG tablet Take 1 tablet (2.5 mg total) by mouth 2 (two) times a week. Take every Monday and Friday 05/16/21 08/14/21  Rafael Bihari, FNP  metoprolol succinate (TOPROL XL) 25 MG 24 hr tablet Take 1.5 tablets (37.5 mg total) by mouth daily. 04/26/21 04/26/22  Sherran Needs, NP  Multiple Vitamins-Minerals (MULTIVITAMIN WOMEN PO) Take 1 tablet by mouth daily.    [provider]  nitroGLYCERIN (NITROSTAT) 0.4 MG SL tablet Place 1 tablet (0.4 mg total) under the tongue every 5 (five) minutes x 3 doses as needed for chest pain. 01/08/14   Charolette Forward, MD  ondansetron (ZOFRAN ODT) 4 MG disintegrating tablet Take 1 tablet (4 mg total) by mouth every 8 (eight) hours as needed for nausea or vomiting. Patient not taking: Reported on 07/28/2021 03/21/21    [name], NP  pantoprazole (PROTONIX) 40 MG tablet TAKE 1 TABLET BY MOUTH TWICE A DAY 07/06/21   [name], NP  potassium chloride SA (KLOR-CON) 20 MEQ tablet Take 4 tablets (80 mEq total) by mouth daily. Take an additional 40 meq with every dose of METOLAZONE 07/13/21   Joette Catching, PA-C  RESTASIS 0.05 % ophthalmic emulsion Place 1 drop into both eyes 2 (two) times daily. 11/19/17   [provider]  rosuvastatin (CRESTOR) 10 MG tablet Take 1 tablet (10 mg total) by mouth daily. 05/16/21   Rafael Bihari, FNP  torsemide (DEMADEX) 100 MG tablet Take 100 mg by mouth daily. 07/11/21   [provider]    Physical Exam:  Constitutional: Morbidly obese female who appears to be in discomfort Vitals:   07/29/21 0600 07/29/21 0630 07/29/21 0815 07/29/21 1053  BP: 122/80 120/72 (!) 139/102   Pulse: 83 84 89   Resp: 17 20 (!) 26   Temp:      TempSrc:      SpO2: 94% 99% 95%   Weight:    124.8 kg  Height:    '5\' 7"'$  (1.702 m)   Eyes: PERRL, lids and conjunctivae normal ENMT: Mucous membranes are moist. Posterior pharynx clear of any exudate or lesions.  Neck: normal, supple, no masses, no thyromegaly.  Difficult to assess, but not clearly appreciated at this time. Respiratory: Noted some mild bibasilar crackles, but no significant wheezes or rhonchi.  O2 saturation currently maintained on room air. Cardiovascular: Irregular rhythm.  At least +1 pitting edema. Abdomen: no tenderness, no masses palpated. No  Bowel sounds positive.  Musculoskeletal: no clubbing / cyanosis. No joint deformity upper and lower extremities.  Normal muscle tone. Skin: no rashes, lesions, ulcers. No induration Neurologic: CN 2-12 grossly intact.  Able to move all extremity Psychiatric: Normal judgment and insight. Alert and oriented x 3. Normal mood.     Labs on Admission: I have personally reviewed following labs and imaging studies  CBC: Recent Labs  Lab  07/28/21 1515  WBC 5.0  HGB 12.8  HCT 40.3  MCV 89.6  PLT 193   Basic Metabolic Panel: Recent Labs  Lab 07/28/21  1515  NA 139  K 3.4*  CL 93*  CO2 34*  GLUCOSE 108*  BUN 51*  CREATININE 3.66*  CALCIUM 10.1   GFR: Estimated Creatinine Clearance: 21 mL/min (A) (by C-G formula based on SCr of 3.66 mg/dL (H)). Liver Function Tests: No results for input(s): AST, ALT, ALKPHOS, BILITOT, PROT, ALBUMIN in the last 168 hours. No results for input(s): LIPASE, AMYLASE in the last 168 hours. No results for input(s): AMMONIA in the last 168 hours. Coagulation Profile: No results for input(s): INR, PROTIME in the last 168 hours. Cardiac Enzymes: No results for input(s): CKTOTAL, CKMB, CKMBINDEX, TROPONINI in the last 168 hours. BNP (last 3 results) No results for input(s): PROBNP in the last 8760 hours. HbA1C: No results for input(s): HGBA1C in the last 72 hours. CBG: Recent Labs  Lab 07/29/21 0848  GLUCAP 135*   Lipid Profile: No results for input(s): CHOL, HDL, LDLCALC, TRIG, CHOLHDL, LDLDIRECT in the last 72 hours. Thyroid Function Tests: No results for input(s): TSH, T4TOTAL, FREET4, T3FREE, THYROIDAB in the last 72 hours. Anemia Panel: No results for input(s): VITAMINB12, FOLATE, FERRITIN, TIBC, IRON, RETICCTPCT in the last 72 hours. Urine analysis:    Component Value Date/Time   COLORURINE YELLOW 05/02/2021 1953   APPEARANCEUR HAZY (A) 05/02/2021 1953   LABSPEC 1.014 05/02/2021 1953   PHURINE 6.0 05/02/2021 1953   GLUCOSEU NEGATIVE 05/02/2021 1953   HGBUR NEGATIVE 05/02/2021 1953   BILIRUBINUR NEGATIVE 05/02/2021 1953   BILIRUBINUR negative 09/24/2019 1742   KETONESUR NEGATIVE 05/02/2021 1953   PROTEINUR 30 (A) 05/02/2021 1953   UROBILINOGEN 1.0 09/24/2019 1742   UROBILINOGEN 1.0 10/26/2014 0249   NITRITE NEGATIVE 05/02/2021 1953   LEUKOCYTESUR NEGATIVE 05/02/2021 1953   Sepsis Labs: Recent Results (from the past 240 hour(s))  Resp Panel by RT-PCR (Flu A&B,  Covid) Nasopharyngeal Swab     Status: None   Collection Time: 07/28/21  7:51 PM   Specimen: Nasopharyngeal Swab; Nasopharyngeal(NP) swabs in vial transport medium  Result Value Ref Range Status   SARS Coronavirus 2 by RT PCR NEGATIVE NEGATIVE Final    Comment: (NOTE) SARS-CoV-2 target nucleic acids are NOT DETECTED.  The SARS-CoV-2 RNA is generally detectable in upper respiratory specimens during the acute phase of infection. The lowest concentration of SARS-CoV-2 viral copies this assay can detect is 138 copies/mL. A negative result does not preclude SARS-Cov-2 infection and should not be used as the sole basis for treatment or other patient management decisions. A negative result may occur with  improper specimen collection/handling, submission of specimen other than nasopharyngeal swab, presence of viral mutation(s) within the areas targeted by this assay, and inadequate number of viral copies(<138 copies/mL). A negative result must be combined with clinical observations, patient history, and epidemiological information. The expected result is Negative.  Fact Sheet for Patients:  EntrepreneurPulse.com.au  Fact Sheet for Healthcare Providers:  IncredibleEmployment.be  This test is no t yet approved or cleared by the [place] FDA and  has been authorized for detection and/or diagnosis of SARS-CoV-2 by FDA under an Emergency Use Authorization (EUA). This EUA will remain  in effect (meaning this test can be used) for the duration of the COVID-19 declaration under Section 564(b)(1) of the Act, 21 U.S.C.section 360bbb-3(b)(1), unless the authorization is terminated  or revoked sooner.       Influenza A by PCR NEGATIVE NEGATIVE Final   Influenza B by PCR NEGATIVE NEGATIVE Final    Comment: (NOTE) The Xpert Xpress SARS-CoV-2/FLU/RSV plus assay is intended  as an aid in the diagnosis of influenza from Nasopharyngeal swab specimens and should  not be used as a sole basis for treatment. Nasal washings and aspirates are unacceptable for Xpert Xpress SARS-CoV-2/FLU/RSV testing.  Fact Sheet for Patients: EntrepreneurPulse.com.au  Fact Sheet for Healthcare Providers: IncredibleEmployment.be  This test is not yet approved or cleared by the [place] FDA and has been authorized for detection and/or diagnosis of SARS-CoV-2 by FDA under an Emergency Use Authorization (EUA). This EUA will remain in effect (meaning this test can be used) for the duration of the COVID-19 declaration under Section 564(b)(1) of the Act, 21 U.S.C. section 360bbb-3(b)(1), unless the authorization is terminated or revoked.  Performed at [facility], [address] [place], [place] [address]      Radiological Exams on Admission: DG Chest 2 View  Result Date: 07/28/2021 CLINICAL DATA:  Chest pain. Additional history provided by scanning technologist: Patient reports chest pain with associated shortness of breath/dyspnea on exertion. Pain described as sharp. Pain radiates to mid back. EXAM: CHEST - 2 VIEW COMPARISON:  Prior chest radiographs 06/21/2021 and earlier. Chest CT 06/21/2021. FINDINGS: Prior median sternotomy and left atrial clipping. Prosthetic cardiac valve. Central pulmonary vascular congestion is suspected. However, there is no appreciable airspace consolidation or frank pulmonary edema. No evidence of pleural effusion or pneumothorax. No acute bony abnormality identified. Surgical clips within the upper abdomen. IMPRESSION: Cardiomegaly with suspected central pulmonary vascular congestion. No appreciable airspace consolidation or frank pulmonary edema. Electronically Signed   By: Kellie Simmering D.O.   On: 07/28/2021 15:53    EKG: Independently reviewed.  Appears to be atrial fibrillation with premature ventricular complexes at 95 bpm QTc -526  Assessment/Plan Chest painCAD: Patient  presents with complaints of continued chest pain that she describes as sharp and pressure like.  EKG notes atrial fibrillation without significant ischemic changes.  High-sensitivity troponins were negative x2.  Previous cardiac cath from 2019 noted mild nonobstructive coronary artery disease. -Admit to a telemetry bed -Check echocardiogram -Continue statin and beta-blocker -IV morphine as needed for pain -Cardiology consulted, we will follow-up for any further recommendation  Combined systolic and diastolic CHF: Chronic.  Patient report weight has been anywhere from 282-286 lbs over the last few weeks.  On physical exam patient with at least 1+ pitting bilateral lower extremity edema.  Chest x-ray noted cardiomegaly without frank edema.  BNP was noted to be elevated at 217.4.  Last echocardiogram revealed EF of 40-45% in 02/2021.  He had been given additional furosemide 100 mg p.o. at droppage prior to being transported, but also reported having taken her home torsemide $RemoveBefore'100mg'[id number]$  for that day. -Strict intake and output and daily weights -Deferring diuresis per cardiology   Acute kidney injury superimposed on chronic kidney disease stage IV: Patient presented with creatinine elevated up to 3.66 with BUN 51.  Baseline creatinine previously had been around 2.6-3 range recently.  This is greater than 0.3 increase from baseline's increase signifying an acute kidney injury.  She is followed by Dr. Posey Pronto of nephrology in the outpatient setting. -Check FeUr -Check urinalysis -Will formally consult nephrology if kidney function does not appear to be improving back to baseline  Paroxysmal atrial fibrillation/flutter: Patient appears to be in atrial fibrillation currently.  Per office note 12/7 patient is supposed to be on amiodarone 200 mg twice daily for the next week and to decrease back to once daily thereafter. -Continue Eliquis, metoprolol, and amiodarone twice daily  Nausea and vomiting: Patient  reportedly  had 3 episodes of nausea and vomiting of the last 24 hours.  Similar symptoms noted during previous hospitalization August. -Continue to monitor  Hypokalemia: Acute.  Potassium initially was noted to be 3.4 yesterday. -Give 40 mEq of potassium chloride  Prolonged QT interval: QTC was 526. -Avoid further QT prolonging medications  Valvular heart disease: Status post maze procedure in 12/2020  COPD, without exacerbation: On physical exam patient's lungs appear to be mostly clear with some mild basilar crackles. -Albuterol nebs as needed for shortness of breath/wheezing  Essential hypertension: Blood pressures currently stable. -Continue metoprolol as tolerated  Diabetes mellitus type 2: Last hemoglobin A1c 5.8 on 05/04/2011.  Home medication regimen includes Trulicity 1.5 mg q. Wednesday and NovoLog  2 to 10 units 3 times daily with meals. -CBGs before every meal with sensitive SSI  Anxiety and depression -Continue Cymbalta, BuSpar, and Xanax as needed  Hyperlipidemia -Continue Crestor  OSA: Patient has a sleep study pending for CPAP titration -Continue CPAP nightly  GERD -Held Protonix due to prolonged QT interval  Morbid obesity: BMI 43.09 kg/m  DVT prophylaxis: Eliquis Code Status: Full Family Communication: Called sister to update left voicemail Disposition Plan: Hopefully discharge home once medically stable Consults called: Cardiology Admission status: Observation  Norval Morton MD [facility]   If 7PM-7AM, please contact night-coverage   07/29/2021, 10:58 AM

## 2021-07-29 NOTE — Progress Notes (Signed)
Heart Failure Navigator Progress Note  Assessed for Heart & Vascular TOC clinic readiness.  Patient does not meet criteria due to prior to hospitalization pt is established with AHF clinic and paramedicine program.   Navigator available for reassessment of patient.   Pricilla Holm, MSN, RN Heart Failure Nurse Navigator [phone]

## 2021-07-29 NOTE — Progress Notes (Signed)
  RHC results  Findings:  RA = 5 RV = 36/8 PA = 40/11 (26) PCW = 13 Fick cardiac output/index = 4.3/1.9 PVR = 3.0 WU FA sat = 95% PA sat = 52%, 55%  Assessment: Normal filling pressures with mild PAH Moderately reduced cardiac out likely due in part to low fluid status  Plan/Discussion:   Volume status normal to low. Dyspnea likely not related to HF. Would hold diuretics for now and resume when Scr improving. We will follow as outpatient.   We will sign off. [place] for d/c from our perspective.  Glori Bickers, MD  5:06 PM

## 2021-07-29 NOTE — ED Notes (Signed)
Report called to care link 

## 2021-07-29 NOTE — Consult Note (Addendum)
Advanced Heart Failure Team Consult Note   Primary Physician: [name], NP PCP-Cardiologist:  Buford Dresser, MD  Reason for Consultation: Acute on chronic systolic HF  HPI:    [name] is seen today for evaluation of acute on chronic systolic HF at the request of Dr. Tamala Julian with [facility].   65 y.o. female with history of morbid obesity, chronic diastolic and systolic HF with mildly reduced LVEF (45-50%), severe MR/TR, atrial fibrillation on Eliquis, HTN, and DMII.    She has a known history of HF with mildly reduced LVEF of 45-50%, cath in 2019 with mild, non-obstructive CAD. Also with history of difficult to control Afib with failed Tikosyn, now on amiodarone.   Admitted 12/19/20 with worsening HF.  LHC 5/19 non-obstructive CAD. RHC 12/22/20 with elevated filling pressures and low CI.    Underwent MV/TV repair with Maze on 12/30/20. Post-operatively, she struggled  with low output progressive renal failure and volume overload. Post operative Echo showed LVEF 55% RV moderately HK. Moderate TR. No significant MR. No effusion. Hemodynamics improved w/ milrinone. Suspected of having post-op ATN. She was discharged on 5/28. D/c wt was 281 lb. SCr was 2.8. She was also in Afib day of d/c.    She was seen in ED 01/29/21 with SOB and CP, with decreased urinary output. She was admitted for A/CHF, diuresed with IV lasix, RHC showed low filling pressures and moderately reduced CO. She underwent successful DCCV 02/01/21. 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    Seen in ED 02/07/21 for leg swelling & CP. She was back in Afib, rate controlled. She was loaded w amiodarone gtt, successful DCCV on 02/09/21. 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    She was back in A fib and volume overloaded 02/22/21 at follow up. Torsemide was increased to 80 mg bid and amio increased to 200 mg bid. (She did not  tell staff she was out of amio).    She returned to HF clinic 2 days later and was in A fib RVR with rate in the 130s. She was sent to the ED for further evaluation. EP consulted. She converted to NSR on IV amiodarone. SCr trended up and diuretics held. Repeat echo showed new reduction in EF, in setting of AFL with RVR,  EF 30-35%, moderate LVH, moderate RV dysfunction. Transitioned to po amio and torsemide, discharge weight 271 lbs.   S/p DCCV 04/19/21, but back in afib at follow up a week later at [facility] appointment. Weight up 18 lbs, and more SOB. EP felt nothing else to offer her to maintain SR. Beta Blocker increased and amio 400 bid continued. We suggested increasing torsemide to 80 mg bid with close HF follow up.   She was seen in the HF clinic on 04/28/21. Volume overloaded and given 80 mg IV lasix + 5 mg metolazone in the clinic with good response. She was then instructed 5 mg daily x3 day with 80 mg torsemide. EKG during the visit showed SR. Cardiomems was also considered.    Admitted 9/12-9/16/22 with CP and AKI on CKD IV. SCr 3.12 in ED, likely due to over diuresis. AHF consulted, diuretics held, and she received gentle IV hydration. She remained in AFL rate controlled.  CP felt to be non-cardiac in nature and possibly psychosomatic. SCr trended back to her baseline (2.9 day of d/c) her metolazone was stopped and home dose of  HF medications were continued at discharge, weight 268 lbs.   Has been followed closely in HF clinic. Multiple adjustments in diuretics last few months. Last f/u 11/23. Reported more dyspnea. Weight down 9 lb from last clinic f/u. Volume appeared okay. Back in atrial fib/flutter. Amiodarone increased to 200 mg BID.  Saw EP for f/u on 07/27/21. Back in NSR. Recommended to continue amio 200 BID X 1 week then reduce to 200 mg daily. AV nodal ablation/pacemaker to be considered if recurrent AF.   Seen by paramedicine for home visit yesterday. Reported central CP, dyspnea  and dizziness. Also episodes of nausea and vomiting. Weight 277 lb, down 5 lb from last clinic f/u. ED evaluation recommended. ECG demonstrated AF with rate 86 pbm, PVCs, Qtc 502 ms. Chest xray with evidence of CHF. Scr 3.66 , baseline last few months around 2.6-2.8. HS troponin 6>7. BNP 217. O2 desaturated to 80s when up ambulating in hall. Given 100 mg lasix po last night and admitted to [facility] for management of acute on chronic systolic HF.  Patient reports increasing orthopnea, PND and chest heaviness x 3-4 days. Also notes central chest pain that is reproducible with direct palpation. Does not occur with relationship to exertion.   Has been taking medications as prescribed. Does not use salt shaker but does consume higher sodium foods such as fried chicken   Review of Systems: [y] = yes, [ ]  = no   General: Weight gain [ ] ; Weight loss [ ] ; Anorexia [ ] ; Fatigue [Y]; Fever [ ] ; Chills [ ] ; Weakness [ ]   Cardiac: Chest pain/pressure [ Y]; Resting SOB [Y]; Exertional SOB [Y]; Orthopnea [Y]; Pedal Edema [Y]; Palpitations [ ] ; Syncope [ ] ; Presyncope [ ] ; Paroxysmal nocturnal dyspnea[Y ]  Pulmonary: Cough [ ] ; Wheezing[ ] ; Hemoptysis[ ] ; Sputum [ ] ; Snoring [ ]   GI: Vomiting[Y]; Dysphagia[ ] ; Melena[ ] ; Hematochezia [ ] ; Heartburn[ ] ; Abdominal pain [ ] ; Constipation [ ] ; Diarrhea [ ] ; BRBPR [ ]   GU: Hematuria[ ] ; Dysuria [ ] ; Nocturia[ ]   Vascular: Pain in legs with walking [ ] ; Pain in feet with lying flat [ ] ; Non-healing sores [ ] ; Stroke [ ] ; TIA [ ] ; Slurred speech [ ] ;  Neuro: Headaches[ ] ; Vertigo[ ] ; Seizures[ ] ; Paresthesias[ ] ;Blurred vision [ ] ; Diplopia [ ] ; Vision changes [ ]   Ortho/Skin: Arthritis [ ] ; Joint pain [ ] ; Muscle pain [ ] ; Joint swelling [ ] ; Back Pain [ ] ; Rash [ ]   Psych: Depression[ ] ; Anxiety[ ]   Heme: Bleeding problems [ ] ; Clotting disorders [ ] ; Anemia [ ]   Endocrine: Diabetes [Y]; Thyroid dysfunction[ ]   Home Medications Prior to Admission medications    Medication Sig Start Date End Date Taking? Authorizing Provider  acetaminophen (TYLENOL) 500 MG tablet Take 1,000 mg by mouth every 6 (six) hours as needed for mild pain.   Yes [provider]  albuterol (VENTOLIN HFA) 108 (90 Base) MCG/ACT inhaler Inhale 2 puffs into the lungs every 4 (four) hours as needed for wheezing or shortness of breath (Asthma). 08/15/19  Yes [provider]  ALPRAZolam Duanne Moron) 0.5 MG tablet Take 1 tablet (0.5 mg total) by mouth 2 (two) times daily as needed for anxiety. Patient taking differently: Take 0.5 mg by mouth 2 (two) times daily. 02/15/21  Yes [name], NP  amiodarone (PACERONE) 200 MG tablet Take 1 tablet (200 mg total) by mouth daily for 7 days, THEN 1 tablet (200 mg total) daily. Patient taking differently: 200 mg  daily 07/27/21 08/03/22 Yes Vickie Epley, MD  apixaban (ELIQUIS) 5 MG TABS tablet Take 1 tablet (5 mg total) by mouth 2 (two) times daily. 05/16/21  Yes Milford, Maricela Bo, FNP  busPIRone (BUSPAR) 5 MG tablet Take 1 tablet (5 mg total) by mouth 2 (two) times daily. 07/04/21  Yes [name], NP  docusate sodium (COLACE) 100 MG capsule Take 100 mg by mouth 2 (two) times daily.   Yes [provider]  Dulaglutide (TRULICITY) 1.5 ZO/1.0RU SOPN Inject 1.5 mg into the skin once a week. Patient taking differently: Inject 1.5 mg into the skin once a week. Wednesday's 06/14/21  Yes [name], NP  DULoxetine (CYMBALTA) 60 MG capsule Take 1 capsule (60 mg total) by mouth daily. 06/14/21  Yes [name], NP  EPINEPHrine 0.3 mg/0.3 mL IJ SOAJ injection Inject 0.3 mg into the muscle as needed for anaphylaxis. 06/25/20  Yes [name], NP  fluticasone (FLONASE) 50 MCG/ACT nasal spray Place 2 sprays into both nostrils daily. Patient taking differently: Place 2 sprays into both nostrils daily as needed for allergies. 05/24/21  Yes [name], NP  insulin aspart (NOVOLOG) 100 UNIT/ML  injection Inject 12 Units into the skin 3 (three) times daily before meals. For blood sugars 0-199 give 0 units of insulin, 201-250 give 4 units, 251-300 give 6 units, 301-350 give 8 units, 351-400 give 10 units,> 400 give 12 units and call M.D. Patient taking differently: Inject 2-10 Units into the skin 3 (three) times daily before meals. Per sliding scale 06/24/20  Yes [name], NP  loratadine (CLARITIN) 10 MG tablet Take 1 tablet (10 mg total) by mouth daily. 05/24/21  Yes [name], NP  metolazone (ZAROXOLYN) 2.5 MG tablet Take 1 tablet (2.5 mg total) by mouth 2 (two) times a week. Take every Monday and Friday 05/16/21 08/14/21 Yes Milford, Maricela Bo, FNP  metoprolol succinate (TOPROL XL) 25 MG 24 hr tablet Take 1.5 tablets (37.5 mg total) by mouth daily. 04/26/21 04/26/22 Yes Sherran Needs, NP  Multiple Vitamins-Minerals (MULTIVITAMIN WOMEN PO) Take 1 tablet by mouth daily.   Yes [provider]  nitroGLYCERIN (NITROSTAT) 0.4 MG SL tablet Place 1 tablet (0.4 mg total) under the tongue every 5 (five) minutes x 3 doses as needed for chest pain. 01/08/14  Yes Charolette Forward, MD  pantoprazole (PROTONIX) 40 MG tablet TAKE 1 TABLET BY MOUTH TWICE A DAY Patient taking differently: Take 40 mg by mouth daily. 07/06/21  Yes [name], NP  potassium chloride SA (KLOR-CON) 20 MEQ tablet Take 4 tablets (80 mEq total) by mouth daily. Take an additional 40 meq with every dose of METOLAZONE Patient taking differently: Take 40 mEq by mouth 2 (two) times daily. Take an additional 20 meq with every dose of METOLAZONE 07/13/21  Yes Joette Catching, PA-C  RESTASIS 0.05 % ophthalmic emulsion Place 1 drop into both eyes 2 (two) times daily. 11/19/17  Yes [provider]  rosuvastatin (CRESTOR) 10 MG tablet Take 1 tablet (10 mg total) by mouth daily. Patient taking differently: Take 10 mg by mouth every evening. 05/16/21  Yes Milford, Maricela Bo, FNP  torsemide (DEMADEX) 100  MG tablet Take 100 mg by mouth daily. 07/11/21  Yes [provider]  Alcohol Swabs (B-D SINGLE USE SWABS REGULAR) PADS 1 each by Other route daily. 07/06/21   [name], NP  blood glucose meter kit and supplies KIT Dispense based on patient and  insurance preference. Use up to four times daily as directed. 02/10/21   Patrecia Pour, MD  blood glucose meter kit and supplies Dispense based on patient and insurance preference. Use up to four times daily as directed. (FOR ICD-10 E10.9, E11.9). 07/06/21   [name], NP  Blood Glucose Monitoring Suppl (ACCU-CHEK GUIDE ME) w/Device KIT Use as instructed to check blood sugar three times daily. E11.69 04/21/20   Charlott Rakes, MD  Blood Glucose Monitoring Suppl (ONETOUCH VERIO) w/Device KIT Use to check blood sugar TID. E11.69 Patient not taking: Reported on 07/28/2021 02/04/21   Charlott Rakes, MD  colchicine 0.6 MG tablet Taking 1/2 tablet by mouth daily    [provider]  glucose blood (ACCU-CHEK GUIDE) test strip Use to check blood sugar TID. E11.69 07/13/21   Charlott Rakes, MD  HYDROcodone-acetaminophen (NORCO/VICODIN) 5-325 MG tablet Take 1 tablet by mouth every 6 (six) hours as needed. Patient not taking: Reported on 07/28/2021 06/21/21   Valarie Merino, MD    Past Medical History: Past Medical History:  Diagnosis Date   Allergic rhinitis    Arthritis    Asthma    Chest pain 12/27/2017   Chronic diastolic CHF (congestive heart failure) (HCC)    COPD (chronic obstructive pulmonary disease) (HCC)    Depression    DM (diabetes mellitus) ([place])    DVT (deep vein thrombosis) in pregnancy    Dysrhythmia    atrial fibrilation   GERD (gastroesophageal reflux disease)    Headache(784.0)    HTN (hypertension)    Hyperlipidemia    Obesity    Pneumonia 04/2018   RIGHT LOBE   Shortness of breath    Sleep apnea    compliant with CPAP    Past Surgical History: Past Surgical History:  Procedure Laterality  Date   ABDOMINAL HYSTERECTOMY     BUBBLE STUDY  11/26/2020   Procedure: BUBBLE STUDY;  Surgeon: Buford Dresser, MD;  Location: [place];  Service: Cardiovascular;;   CARDIOVERSION N/A 05/06/2020   Procedure: CARDIOVERSION;  Surgeon: Buford Dresser, MD;  Location: [facility] ENDOSCOPY;  Service: Cardiovascular;  Laterality: N/A;   CARDIOVERSION N/A 11/04/2020   Procedure: CARDIOVERSION;  Surgeon: Lelon Perla, MD;  Location: [place];  Service: Cardiovascular;  Laterality: N/A;   CARDIOVERSION N/A 02/01/2021   Procedure: CARDIOVERSION;  Surgeon: Jolaine Artist, MD;  Location: [place];  Service: Cardiovascular;  Laterality: N/A;   CARDIOVERSION N/A 02/09/2021   Procedure: CARDIOVERSION;  Surgeon: Jolaine Artist, MD;  Location: [place];  Service: Cardiovascular;  Laterality: N/A;   CARDIOVERSION N/A 04/19/2021   Procedure: CARDIOVERSION;  Surgeon: Fay Records, MD;  Location: [place];  Service: Cardiovascular;  Laterality: N/A;   CATARACT EXTRACTION, BILATERAL     CHOLECYSTECTOMY     COLONOSCOPY WITH PROPOFOL N/A 03/05/2015   Procedure: COLONOSCOPY WITH PROPOFOL;  Surgeon: Carol Ada, MD;  Location: [facility] ENDOSCOPY;  Service: Endoscopy;  Laterality: N/A;   DIAGNOSTIC LAPAROSCOPY     ingrown hallux Left    KNEE SURGERY     LEFT HEART CATH AND CORONARY ANGIOGRAPHY N/A 12/31/2017   Procedure: LEFT HEART CATH AND CORONARY ANGIOGRAPHY;  Surgeon: Charolette Forward, MD;  Location: [place] CV LAB;  Service: Cardiovascular;  Laterality: N/A;   MAZE N/A 12/29/2020   Procedure: MAZE;  Surgeon: [name], MD;  Location: [place];  Service: Open Heart Surgery;  Laterality: N/A;   MITRAL VALVE REPAIR N/A 12/29/2020   Procedure: MITRAL VALVE REPAIR USING CARBOMEDICS  ANNULOFLEX RING SIZE 30;  Surgeon: [name], MD;  Location: [place];  Service: Open Heart Surgery;  Laterality: N/A;   RIGHT HEART CATH N/A 12/22/2020   Procedure: RIGHT HEART CATH;   Surgeon: Larey Dresser, MD;  Location: [place] CV LAB;  Service: Cardiovascular;  Laterality: N/A;   RIGHT HEART CATH N/A 01/31/2021   Procedure: RIGHT HEART CATH;  Surgeon: Jolaine Artist, MD;  Location: [place] CV LAB;  Service: Cardiovascular;  Laterality: N/A;   TEE WITHOUT CARDIOVERSION N/A 11/26/2020   Procedure: TRANSESOPHAGEAL ECHOCARDIOGRAM (TEE);  Surgeon: Buford Dresser, MD;  Location: [facility] ENDOSCOPY;  Service: Cardiovascular;  Laterality: N/A;   TEE WITHOUT CARDIOVERSION N/A 12/29/2020   Procedure: TRANSESOPHAGEAL ECHOCARDIOGRAM (TEE);  Surgeon: [name], MD;  Location: [place];  Service: Open Heart Surgery;  Laterality: N/A;   TRICUSPID VALVE REPLACEMENT N/A 12/29/2020   Procedure: 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 RING SIZE 34;  Surgeon: [name], MD;  Location: [place];  Service: Open Heart Surgery;  Laterality: N/A;    Family History: Family History  Problem Relation Age of Onset   Cancer Mother    Hypertension Mother    Cancer Father    CAD Other    Hypertension Sister    Hypertension Brother     Social History: Social History   Socioeconomic History   Marital status: Significant Other    Spouse name: Not on file   Number of children: 2   Years of education: Not on file   Highest education level: Some college, no degree  Occupational History   Not on file  Tobacco Use   Smoking status: Never   Smokeless tobacco: Never  Vaping Use   Vaping Use: Never used  Substance and Sexual Activity   Alcohol use: No   Drug use: No   Sexual activity: Not Currently  Other Topics Concern   Not on file  Social History Narrative   Pt lives in [place] with spouse.  2 grown children.   Previously worked in [employer] at [employer].  Now on disability   Social Determinants of Health   Financial Resource Strain: Low Risk    Difficulty of Paying Living Expenses: Not very hard  Food Insecurity: No Food Insecurity    Worried About Charity fundraiser in the Last Year: Never true   Ran Out of Food in the Last Year: Never true  Transportation Needs: No Transportation Needs   Lack of Transportation (Medical): No   Lack of Transportation (Non-Medical): No  Physical Activity: Not on file  Stress: Not on file  Social Connections: Not on file    Allergies:  Allergies  Allergen Reactions   Bee Pollen Anaphylaxis   Sulfa Antibiotics Itching    Objective:    Vital Signs:   Temp:  [98 F (36.7 C)-98.1 F (36.7 C)] 98 F (36.7 C) (12/09 1059) Pulse Rate:  [55-94] 83 (12/09 1059) Resp:  [16-26] 18 (12/09 1059) BP: (91-142)/(60-124) 103/71 (12/09 1059) SpO2:  [92 %-100 %] 94 % (12/09 1059) Weight:  [124.8 kg-129.3 kg] 124.8 kg (12/09 1053) Last BM Date: 07/28/21  Weight change: Filed Weights   07/28/21 1739 07/29/21 1053  Weight: 129.3 kg 124.8 kg    Intake/Output:  No intake or output data in the 24 hours ending 07/29/21 1210    Physical Exam    General:  Well appearing. No resp difficulty HEENT: normal Neck: supple. JVP . Carotids 2+ bilat; no  bruits. No lymphadenopathy or thyromegaly appreciated. Cor: PMI nondisplaced. Regular rate & rhythm. No rubs, gallops or murmurs. Lungs: clear Abdomen: soft, nontender, nondistended. No hepatosplenomegaly. No bruits or masses. Good bowel sounds. Extremities: no cyanosis, clubbing, rash, edema Neuro: alert & orientedx3, cranial nerves grossly intact. moves all 4 extremities w/o difficulty. Affect pleasant   Telemetry   AF 80s - low 100s, 10 PVCs/min  EKG    12/08: Afib/Afl? Rate 86bpm, 2 PVCs,   Labs   Basic Metabolic Panel: Recent Labs  Lab 07/28/21 1515 07/29/21 1119  NA 139 138  K 3.4* 3.4*  CL 93* 94*  CO2 34* 33*  GLUCOSE 108* 163*  BUN 51* 55*  CREATININE 3.66* 3.73*  CALCIUM 10.1 9.7    Liver Function Tests: No results for input(s): AST, ALT, ALKPHOS, BILITOT, PROT, ALBUMIN in the last 168 hours. No results for  input(s): LIPASE, AMYLASE in the last 168 hours. No results for input(s): AMMONIA in the last 168 hours.  CBC: Recent Labs  Lab 07/28/21 1515  WBC 5.0  HGB 12.8  HCT 40.3  MCV 89.6  PLT 220    Cardiac Enzymes: No results for input(s): CKTOTAL, CKMB, CKMBINDEX, TROPONINI in the last 168 hours.  BNP: BNP (last 3 results) Recent Labs    05/16/21 1247 05/19/21 1400 07/28/21 1515  BNP 188.7* 285.3* 217.4*    ProBNP (last 3 results) No results for input(s): PROBNP in the last 8760 hours.   CBG: Recent Labs  Lab 07/29/21 0848  GLUCAP 135*    Coagulation Studies: No results for input(s): LABPROT, INR in the last 72 hours.   Imaging   DG Chest 2 View  Result Date: 07/28/2021 CLINICAL DATA:  Chest pain. Additional history provided by scanning technologist: Patient reports chest pain with associated shortness of breath/dyspnea on exertion. Pain described as sharp. Pain radiates to mid back. EXAM: CHEST - 2 VIEW COMPARISON:  Prior chest radiographs 06/21/2021 and earlier. Chest CT 06/21/2021. FINDINGS: Prior median sternotomy and left atrial clipping. Prosthetic cardiac valve. Central pulmonary vascular congestion is suspected. However, there is no appreciable airspace consolidation or frank pulmonary edema. No evidence of pleural effusion or pneumothorax. No acute bony abnormality identified. Surgical clips within the upper abdomen. IMPRESSION: Cardiomegaly with suspected central pulmonary vascular congestion. No appreciable airspace consolidation or frank pulmonary edema. Electronically Signed   By: Kellie Simmering D.O.   On: 07/28/2021 15:53     Medications:     Current Medications:  ALPRAZolam  0.5 mg Oral BID   amiodarone  200 mg Oral BID   apixaban  5 mg Oral BID   busPIRone  5 mg Oral BID   cycloSPORINE  1 drop Both Eyes BID   docusate sodium  100 mg Oral BID   [START ON 08/03/2021] Dulaglutide  1.5 mg Subcutaneous Weekly   DULoxetine  60 mg Oral Daily    loratadine  10 mg Oral Daily   metoprolol succinate  37.5 mg Oral Daily   rosuvastatin  10 mg Oral QPM   sodium chloride flush  3 mL Intravenous Q12H    Infusions:    Assessment/Plan  1. Chronic Systolic Heart Failure: - RHC (6/22) with low filling pressures with moderately reduced CO. - Echo (7/22) in setting of AFL with RVR, EF 30-35%, moderate RV dysfunction, PASP 48, s/p MV repair with mild MR and mean gradient 10 mmHg with HR 130, s/p TV repair with mild-moderate TR. 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  -Limited echo 07/22: EF 40-45%,  RV mildly reduced, RVSP 33 mmHg, mean gradient of 7 across mitral valve, moderate TR - Echo today - NYHA IIIb, confounded by obesity and deconditioning.  - Volume very difficult on exam. Weight actually down from previous clinic visits. Scr worsening, recent baseline around 2.6-2.8. Up to 3.7 today.  - Will arrange for RHC this afternoon to assess filling pressures as well as CO. Will decide on additional diuresis pending results. - No spiro, ARNI with CKD  - GFR currently too low for SGLT2i - On toprol xl 37.5 mg daily. May need to hold if low-output today  2. Chest pain -HS troponin negative X 2 -Reproducible with direct palpation. Does not appear cardiac in origin   3. PAF - She has failed Tikosyn. - She had Maze in 5/22 and DCCV in 6/22 & 04/19/21, last of which she had ERAF. - seen by EP 07/27/21. Considering ablation vs PPM/AV nodal ablation if AFL returns - Rhythm this admit afib/?AFL rate 90s. ECGs today difficult to interpret d/t artifact - Continue Toprol 37.5 mg daily. - Continue amiodarone 200 mg BID for now - Continue Eliquis 5 mg bid.  - Has OSA, awaiting split night study for CPAP titration.    4. AKI on CKD Stage IV   - New baseline creatinine  ~ 2.6 - 2.8, 3.7 today.  - ? Cardiorenal syndrome -Sees [facility]   5. CAD - Cath (2019): Mild nonobstructive CAD - Notes occasional reproducible chest wall pain. No  symptoms suggestive of angina. - Continue statin + beta blocker - no ASA w/ Eliquis    6. Valvular Heart Disease: - S/p MV and TV repair in 5/22.  - Echo 02/25/21 30-35% with mild-moderate TR.  There is only mild MR, but mean gradient across the valve is 10 mmHg suggesting mitral stenosis - Limited echo 02/28/21: EF 40-45%, mean gradient of 7 across mitral valve and moderate TR   7. Obesity - Body mass index is 43.09 kg/m. - Likely contributing significantly to dyspnea   8. DMII - 01/2021 Hgb A1C 7.4  - GFR too low for SGLT2i  9. Hypokalemia - K 3.4 - Supp   10. Low Literacy Level - now followed by paramedicine in the community   Length of Stay: 0  FINCH, LINDSAY N, PA-C  07/29/2021, 12:10 PM  Advanced Heart Failure Team Pager [phone] (M-F; 7a - 5p)  Please contact [facility] for night-coverage after hours (4p -7a ) and weekends on [email or web address]   Patient seen and examined with the above-signed Advanced Practice Provider and/or Housestaff. I personally reviewed laboratory data, imaging studies and relevant notes. I independently examined the patient and formulated the important aspects of the plan. I have edited the note to reflect any of my changes or salient points. I have personally discussed the plan with the patient and/or family.  65 y/o with morbid obesity, DM2, CKD IV, minimal non-obstructive CAD on cath 2019. Valvular heart disease s/p MV/TV ring 5/22, PAF admitted with recurrent CP and SOB.  Hstrop and ECG negative  BNP 217  General:  Sitting up  No resp difficulty HEENT: normal Neck: supple. Hard to see JVD but doesn;t appear elevated. Carotids 2+ bilat; no bruits. No lymphadenopathy or thryomegaly appreciated. Cor: PMI nondisplaced. Regular rate & rhythm. No rubs, gallops or murmurs. Lungs: clear Abdomen: obese soft, nontender, nondistended. No hepatosplenomegaly. No bruits or masses. Good bowel sounds. Extremities: no cyanosis, clubbing, rash, edema Neuro:  alert & orientedx3, cranial nerves grossly intact. moves all 4  extremities w/o difficulty. Affect pleasant  She is quite symptomatic but no obviosul volume overload on exam. Given degree of symptoms will proceed with RHC to more clearly evaluate. CP is reproducible on palpation. With minimal CAD in 2019, negative troponins and CKD IV would not pursue angiography unless there is objective evidence of ischemia.   Glori Bickers, MD  4:00 PM

## 2021-07-29 NOTE — ED Notes (Signed)
Pt staying pretty consistently in trigeminy. MD notified.

## 2021-07-29 NOTE — Progress Notes (Signed)
Patient to 3E21 from [facility] ED. MD notified. Vital signs obtained. On monitor CCMD notified. Alert and oriented to room and call light. Call bell within reach.  Era Bumpers, RN

## 2021-07-30 DIAGNOSIS — N179 Acute kidney failure, unspecified: Secondary | ICD-10-CM | POA: Diagnosis not present

## 2021-07-30 DIAGNOSIS — R079 Chest pain, unspecified: Secondary | ICD-10-CM | POA: Diagnosis not present

## 2021-07-30 DIAGNOSIS — I5042 Chronic combined systolic (congestive) and diastolic (congestive) heart failure: Secondary | ICD-10-CM | POA: Diagnosis not present

## 2021-07-30 DIAGNOSIS — E1122 Type 2 diabetes mellitus with diabetic chronic kidney disease: Secondary | ICD-10-CM | POA: Diagnosis not present

## 2021-07-30 DIAGNOSIS — R071 Chest pain on breathing: Secondary | ICD-10-CM | POA: Diagnosis not present

## 2021-07-30 LAB — GLUCOSE, CAPILLARY
Glucose-Capillary: 143 mg/dL — ABNORMAL HIGH (ref 70–99)
Glucose-Capillary: 161 mg/dL — ABNORMAL HIGH (ref 70–99)
Glucose-Capillary: 170 mg/dL — ABNORMAL HIGH (ref 70–99)
Glucose-Capillary: 160 mg/dL — ABNORMAL HIGH (ref 70–99)

## 2021-07-30 LAB — BASIC METABOLIC PANEL
Anion gap: 10 (ref 5–15)
Anion gap: 8 (ref 5–15)
BUN: 63 mg/dL — ABNORMAL HIGH (ref 8–23)
BUN: 65 mg/dL — ABNORMAL HIGH (ref 8–23)
CO2: 30 mmol/L (ref 22–32)
CO2: 33 mmol/L — ABNORMAL HIGH (ref 22–32)
Calcium: 8.9 mg/dL (ref 8.9–10.3)
Calcium: 9.2 mg/dL (ref 8.9–10.3)
Chloride: 95 mmol/L — ABNORMAL LOW (ref 98–111)
Chloride: 96 mmol/L — ABNORMAL LOW (ref 98–111)
Creatinine, Ser: 3.67 mg/dL — ABNORMAL HIGH (ref 0.44–1.00)
Creatinine, Ser: 3.82 mg/dL — ABNORMAL HIGH (ref 0.44–1.00)
GFR, Estimated: 13 mL/min — ABNORMAL LOW (ref >60)
GFR, Estimated: 13 mL/min — ABNORMAL LOW (ref >60)
Glucose, Bld: 163 mg/dL — ABNORMAL HIGH (ref 70–99)
Glucose, Bld: 142 mg/dL — ABNORMAL HIGH (ref 70–99)
Potassium: 2.9 mmol/L — ABNORMAL LOW (ref 3.5–5.1)
Potassium: 4.3 mmol/L (ref 3.5–5.1)
Sodium: 135 mmol/L (ref 135–145)
Sodium: 137 mmol/L (ref 135–145)

## 2021-07-30 LAB — CBC
HCT: 37.4 % (ref 36.0–46.0)
Hemoglobin: 12 g/dL (ref 12.0–15.0)
MCH: 28.4 pg (ref 26.0–34.0)
MCHC: 32.1 g/dL (ref 30.0–36.0)
MCV: 88.4 fL (ref 80.0–100.0)
Platelets: 195 10*3/uL (ref 150–400)
RBC: 4.23 MIL/uL (ref 3.87–5.11)
RDW: 13.9 % (ref 11.5–15.5)
WBC: 5.4 10*3/uL (ref 4.0–10.5)
nRBC: 0 % (ref 0.0–0.2)

## 2021-07-30 LAB — HEMOGLOBIN A1C
Hgb A1c MFr Bld: 6.2 % — ABNORMAL HIGH (ref 4.8–5.6)
Mean Plasma Glucose: 131.24 mg/dL

## 2021-07-30 LAB — MAGNESIUM
Magnesium: 2.3 mg/dL (ref 1.7–2.4)
Magnesium: 2.2 mg/dL (ref 1.7–2.4)

## 2021-07-30 LAB — UREA NITROGEN, URINE: Urea Nitrogen, Ur: 423 mg/dL

## 2021-07-30 MED ORDER — POTASSIUM CHLORIDE CRYS ER 20 MEQ PO TBCR
40.0000 meq | EXTENDED_RELEASE_TABLET | ORAL | Status: AC
  Administered 2021-07-30 (×2): 40 meq via ORAL
  Filled 2021-07-30 (×2): qty 2
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
## 2021-07-30 NOTE — Progress Notes (Signed)
PROGRESS NOTE    [name]  UXN:[id number] DOB: May 13, 1956 DOA: 07/28/2021 PCP: [name], NP   Brief Narrative:  HPI: [name] is a 65 y.o. 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  She notes symptoms have been present now for multiple weeks.  Complains of having a left substernal chest pain that she describes as sharp and pressure that feels like an elephant is sitting on her chest.  She rates pain as a 8 out of 10 on the pain scale.  Shortness of breath is worsened with exertion or whenever she is laying flat.  States that she has been abiding by the fluid restriction, but her weight has been fluctuating between 282 -286 pounds, but previously had been around 266 after open heart surgery in May of this year.  Associated symptoms have included lower extremity swelling unchanged, palpitations, and 3 episodes of emesis.  Denies having any significant cough, wheezing, fever, dysuria, abdominal pain, or diarrhea.  Patient reported trying to take Tylenol as well as nitroglycerin without relief in chest pain.  She had been seen last by Dr. Quentin Ore 2 days ago, and was recommended to continue on amiodarone twice daily for another week prior to dropping back down to 200 mg daily.  Review of records note that if she continued to have struggles with maintaining normal rhythm discussions about pacemaker/AV ablation in the future.   ED Course: Upon admission into the emergency department patient was seen to be afebrile, pulse 55-94, respirations 16-26, blood pressures 91/74-142/60, and O2 saturations currently maintained on room air.  Labs significant for potassium 3.4 BUN 31, creatinine 3.66, BNP 217.4, and high-sensitivity  troponin negative x2.  Cardiomegaly with suspected central pulmonary vascular congestion without consolidation or frank edema.  COVID-19 and influenza screening were negative.  Patient had initially been given Lasix 100 mg p.o. milligrams IV, Zofran, Reglan, and Benadryl.  Assessment & Plan:   Active Problems:   Diabetes mellitus (HCC)   Morbid obesity (HCC)   AF (paroxysmal atrial fibrillation) (HCC)   OSA (obstructive sleep apnea)   Chest pain   Nausea and vomiting   Hypokalemia   Acute kidney injury superimposed on CKD (HCC)   Chronic combined systolic and diastolic heart failure (HCC)   COPD (chronic obstructive pulmonary disease) (HCC)   Prolonged QT interval  Chest painCAD: Patient presents with complaints of continued chest pain that she describes as sharp and pressure like.  EKG notes atrial fibrillation without significant ischemic changes.  High-sensitivity troponins were negative x2.  Previous cardiac cath from 2019 noted mild nonobstructive coronary artery disease.  Cardiology consulted.  She underwent RHC which showed normal filling pressures.  She has point tenderness on the chest as well indicating musculoskeletal pain.  ACS ruled out and cardiology cleared her for discharge.  Chronic combined systolic and diastolic CHF/dyspnea:  Patient report weight has been anywhere from 282-286 lbs over the last few weeks.  Per cardiology note who she is very well-known to, her weight is actually down from previous clinic visits.  Chest x-ray noted cardiomegaly without frank edema.  BNP was noted to be elevated at 217.4 which is also close to her baseline.  Last echocardiogram revealed EF of 40-45% in 02/2021.  Underwent RHC which shows normal filling pressure with mild  PAH and moderately reduced cardiac output likely due in part to low fluid status.  Per cardiology, her dyspnea is not likely secondary to CHF and she is not fluid overloaded.  They cleared her for discharge.  She is on Eliquis  already so I do not think she will have any PE.  Although she is complaining of shortness of breath but she is not hypoxic, lungs are clear to auscultation and she looks very comfortable.   Acute kidney injury superimposed on chronic kidney disease stage IV: Patient presented with creatinine elevated up to 3.66 with BUN 51.  Baseline creatinine previously had been around 2.6-3.1 range recently.  Very slight improvement in creatinine.  I wonder if this is her new baseline.  We will keep her overnight, repeat labs in the morning.  If worsening, will consult nephrology but if stable, will be ready for discharge.   Paroxysmal atrial fibrillation/flutter: Rates controlled, she is currently on amiodarone 200 twice daily, Eliquis and Toprol-XL 37.5 mg   Nausea and vomiting: Resolved.   Hypokalemia: Acute.  Down to 2.9 again.  Will replace.   Prolonged QT interval: QTC was 526. 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 -Albuterol nebs as needed for shortness of breath/wheezing   Essential hypertension: Blood pressures currently stable. -Continue metoprolol as tolerated   Diabetes mellitus type 2: Last hemoglobin A1c 5.8 on 05/04/2011.  Home medication regimen includes Trulicity 1.5 mg q. Wednesday and NovoLog  2 to 10 units 3 times daily with meals.  Blood sugar controlled on SSI.   Anxiety and depression -Continue Cymbalta, BuSpar, and Xanax as needed   Hyperlipidemia -Continue Crestor   OSA: Patient has a sleep study pending for CPAP titration -Continue CPAP nightly   GERD -Held Protonix due to prolonged QT interval   Morbid obesity: BMI 43.09 kg/m.  Weight loss encouraged  DVT prophylaxis:    Code Status: Full Code  Family Communication:  None present at bedside.  Plan of care discussed with patient in  length and he verbalized understanding and agreed with it.  Status is: Observation  The patient will require care spanning > 2 midnights and should be moved to inpatient because: Needs monitoring for AKI with hypokalemia.  Estimated body mass index is 43.68 kg/m as calculated from the following:   Height as of this encounter: 5\' 7"  (1.702 m).   Weight as of this encounter: 126.5 kg.     Nutritional Assessment: Body mass index is 43.68 kg/m.Marland Kitchen Seen by dietician.  I agree with the assessment and plan as outlined below: Nutrition Status:   Skin Assessment: I have examined the patient's skin and I agree with the wound assessment as performed by the wound care RN as outlined below:   Consultants:  Cardiology-signed off  Procedures:  RHC  Antimicrobials:  Anti-infectives (From admission, onward)    None          Subjective: Seen and examined.  She still complains of left-sided chest pain and shortness of breath but looks very comfortable.  Objective: Vitals:   07/30/21 0523 07/30/21 0524 07/30/21 0525 07/30/21 1111  BP: (!) 102/58   120/87  Pulse: 81 79 74 91  Resp: 15   18  Temp: 98.1 F (36.7 C)   98.3 F (36.8 C)  TempSrc: Oral   Oral  SpO2: 98%  97% 97% 97%  Weight: 126.5 kg     Height:        Intake/Output Summary (Last 24 hours) at 07/30/2021 1317 Last data filed at 07/30/2021 0859 Gross per 24 hour  Intake 595 ml  Output 1100 ml  Net -505 ml   Filed Weights   07/29/21 1053 07/29/21 1550 07/30/21 0523  Weight: 124.8 kg 124.8 kg 126.5 kg    Examination:  General exam: Appears calm and comfortable, obese Respiratory system: Clear to auscultation. Respiratory effort normal.  Point tenderness at the left chest. Cardiovascular system: S1 & S2 heard, RRR. No JVD, murmurs, rubs, gallops or clicks. No pedal edema. Gastrointestinal system: Abdomen is nondistended, soft and nontender. No organomegaly or masses felt. Normal bowel sounds heard. Central  nervous system: Alert and oriented. No focal neurological deficits. Extremities: Symmetric 5 x 5 power. Skin: No rashes, lesions or ulcers Psychiatry: Judgement and insight appear normal. Mood & affect appropriate.    Data Reviewed: I have personally reviewed following labs and imaging studies  CBC: Recent Labs  Lab 07/28/21 1515 07/29/21 1649 07/30/21 0243  WBC 5.0  --  5.4  HGB 12.8 12.6  12.2 12.0  HCT 40.3 37.0  36.0 37.4  MCV 89.6  --  88.4  PLT 220  --  854   Basic Metabolic Panel: Recent Labs  Lab 07/28/21 1515 07/29/21 1119 07/29/21 1649 07/30/21 0243  NA 139 138 141  142 135  K 3.4* 3.4* 3.3*  3.1* 2.9*  CL 93* 94*  --  95*  CO2 34* 33*  --  30  GLUCOSE 108* 163*  --  163*  BUN 51* 55*  --  63*  CREATININE 3.66* 3.73*  --  3.67*  CALCIUM 10.1 9.7  --  8.9  MG  --   --   --  2.3   GFR: Estimated Creatinine Clearance: 21.1 mL/min (A) (by C-G formula based on SCr of 3.67 mg/dL (H)). Liver Function Tests: No results for input(s): AST, ALT, ALKPHOS, BILITOT, PROT, ALBUMIN in the last 168 hours. No results for input(s): LIPASE, AMYLASE in the last 168 hours. No results for input(s): AMMONIA in the last 168 hours. Coagulation Profile: No results for input(s): INR, PROTIME in the last 168 hours. Cardiac Enzymes: No results for input(s): CKTOTAL, CKMB, CKMBINDEX, TROPONINI in the last 168 hours. BNP (last 3 results) No results for input(s): PROBNP in the last 8760 hours. HbA1C: Recent Labs    07/29/21 1508  HGBA1C 6.2*   CBG: Recent Labs  Lab 07/29/21 0848 07/29/21 1718 07/29/21 2108 07/30/21 0605 07/30/21 1121  GLUCAP 135* 135* 178* 143* 161*   Lipid Profile: No results for input(s): CHOL, HDL, LDLCALC, TRIG, CHOLHDL, LDLDIRECT in the last 72 hours. Thyroid Function Tests: No results for input(s): TSH, T4TOTAL, FREET4, T3FREE, THYROIDAB in the last 72 hours. Anemia Panel: No results for input(s): VITAMINB12, FOLATE, FERRITIN, TIBC, IRON,  RETICCTPCT in the last 72 hours. Sepsis Labs: No results for input(s): PROCALCITON, LATICACIDVEN in the last 168 hours.  Recent Results (from the past 240 hour(s))  Resp Panel by RT-PCR (Flu A&B, Covid) Nasopharyngeal Swab     Status: None   Collection Time: 07/28/21  7:51 PM   Specimen: Nasopharyngeal Swab; Nasopharyngeal(NP) swabs in vial transport medium  Result Value Ref Range Status   SARS Coronavirus 2 by RT PCR NEGATIVE NEGATIVE Final    Comment: (NOTE) SARS-CoV-2 target nucleic acids are NOT DETECTED.  The SARS-CoV-2 RNA is generally detectable in upper  respiratory specimens during the acute phase of infection. The lowest concentration of SARS-CoV-2 viral copies this assay can detect is 138 copies/mL. A negative result does not preclude SARS-Cov-2 infection and should not be used as the sole basis for treatment or other patient management decisions. A negative result may occur with  improper specimen collection/handling, submission of specimen other than nasopharyngeal swab, presence of viral mutation(s) within the areas targeted by this assay, and inadequate number of viral copies(<138 copies/mL). A negative result must be combined with clinical observations, patient history, and epidemiological information. The expected result is Negative.  Fact Sheet for Patients:  EntrepreneurPulse.com.au  Fact Sheet for Healthcare Providers:  IncredibleEmployment.be  This test is no t yet approved or cleared by the [place] FDA and  has been authorized for detection and/or diagnosis of SARS-CoV-2 by FDA under an Emergency Use Authorization (EUA). This EUA will remain  in effect (meaning this test can be used) for the duration of the COVID-19 declaration under Section 564(b)(1) of the Act, 21 U.S.C.section 360bbb-3(b)(1), unless the authorization is terminated  or revoked sooner.       Influenza A by PCR NEGATIVE NEGATIVE Final   Influenza  B by PCR NEGATIVE NEGATIVE Final    Comment: (NOTE) The Xpert Xpress SARS-CoV-2/FLU/RSV plus assay is intended as an aid in the diagnosis of influenza from Nasopharyngeal swab specimens and should not be used as a sole basis for treatment. Nasal washings and aspirates are unacceptable for Xpert Xpress SARS-CoV-2/FLU/RSV testing.  Fact Sheet for Patients: EntrepreneurPulse.com.au  Fact Sheet for Healthcare Providers: IncredibleEmployment.be  This test is not yet approved or cleared by the [place] FDA and has been authorized for detection and/or diagnosis of SARS-CoV-2 by FDA under an Emergency Use Authorization (EUA). This EUA will remain in effect (meaning this test can be used) for the duration of the COVID-19 declaration under Section 564(b)(1) of the Act, 21 U.S.C. section 360bbb-3(b)(1), unless the authorization is terminated or revoked.  Performed at [facility], [address], [place], [place] [address]       Radiology Studies: DG Chest 2 View  Result Date: 07/28/2021 CLINICAL DATA:  Chest pain. Additional history provided by scanning technologist: Patient reports chest pain with associated shortness of breath/dyspnea on exertion. Pain described as sharp. Pain radiates to mid back. EXAM: CHEST - 2 VIEW COMPARISON:  Prior chest radiographs 06/21/2021 and earlier. Chest CT 06/21/2021. FINDINGS: Prior median sternotomy and left atrial clipping. Prosthetic cardiac valve. Central pulmonary vascular congestion is suspected. However, there is no appreciable airspace consolidation or frank pulmonary edema. No evidence of pleural effusion or pneumothorax. No acute bony abnormality identified. Surgical clips within the upper abdomen. IMPRESSION: Cardiomegaly with suspected central pulmonary vascular congestion. No appreciable airspace consolidation or frank pulmonary edema. Electronically Signed   By: Kellie Simmering D.O.   On:  07/28/2021 15:53   CARDIAC CATHETERIZATION  Result Date: 07/29/2021 Findings: RA = 5 RV = 36/8 PA = 40/11 (26) PCW = 13 Fick cardiac output/index = 4.3/1.9 PVR = 3.0 WU FA sat = 95% PA sat = 52%, 55% Assessment: Normal filling pressures with mild PAH Moderately reduced cardiac out likely due in part to low fluid status Plan/Discussion: Volume status normal to low. Dyspnea likely not related to HF. Would hold diuretics for now and resume when Scr improving. We will follow as outpatient. Glori Bickers, MD 5:06 PM  ECHOCARDIOGRAM COMPLETE  Result Date: 07/29/2021    ECHOCARDIOGRAM REPORT   Patient Name:  [name] Date of Exam: 07/29/2021 Medical Rec #:  [id number]            Height:       67.0 in Accession #:    [id number]           Weight:       275.1 lb Date of Birth:  April 16, 1956            BSA:          2.316 m Patient Age:    37 years             BP:           122/80 mmHg Patient Gender: F                    HR:           90 bpm. Exam Location:  Inpatient Procedure: 2D Echo, Cardiac Doppler and Color Doppler Indications:    Chest pain  History:        Patient has prior history of Echocardiogram examinations. COPD,                 Arrythmias:Atrial Fibrillation; Risk Factors:Diabetes and Sleep                 Apnea. TV ring, MV ring.                  Mitral Valve: 30 mm prosthetic annuloplasty ring valve is                 present in the mitral position.  Sonographer:    Jyl Heinz Referring Phys: [id number] RONDELL A SMITH IMPRESSIONS  1. Left ventricular ejection fraction, by estimation, is 40 to 45%. The left ventricle has mildly decreased function. The left ventricle demonstrates global hypokinesis. There is mild left ventricular hypertrophy. Left ventricular diastolic parameters are indeterminate.  2. Right ventricular systolic function is mildly reduced. The right ventricular size is normal. There is normal pulmonary artery systolic pressure.  3. Left atrial size was moderately dilated.   4. Right atrial size was mildly dilated.  5. HR 90 bpm. MV peak gradient, 14.1 mmHg. The mean mitral valve gradient is 6.5 mmHg     . There is a 30 mm prosthetic annuloplasty ring present in the mitral position.  6. S/p repair with an annuloplasty ring.  7. The aortic valve is normal in structure. Aortic valve regurgitation is not visualized. No aortic stenosis is present.  8. The inferior vena cava is normal in size with greater than 50% respiratory variability, suggesting right atrial pressure of 3 mmHg. Conclusion(s)/Recommendation(s): Compared to prior echo 02/28/2021, PA pressure reduced. FINDINGS  Left Ventricle: Left ventricular ejection fraction, by estimation, is 40 to 45%. The left ventricle has mildly decreased function. The left ventricle demonstrates global hypokinesis. The left ventricular internal cavity size was normal in size. There is  mild left ventricular hypertrophy. Abnormal (paradoxical) septal motion consistent with post-operative status. Left ventricular diastolic parameters are indeterminate. Right Ventricle: The right ventricular size is normal. No increase in right ventricular wall thickness. Right ventricular systolic function is mildly reduced. There is normal pulmonary artery systolic pressure. The tricuspid regurgitant velocity is 2.42 m/s, and with an assumed right atrial pressure of 3 mmHg, the estimated right ventricular systolic pressure is 82.9 mmHg. Left Atrium: Left atrial size was moderately dilated. Right Atrium: Right atrial size was mildly dilated. Pericardium: There is no evidence of pericardial effusion. Mitral  Valve: HR 90 bpm. MV peak gradient, 14.1 mmHg. The mean mitral valve gradient is 6.5 mmHg. There is a 30 mm prosthetic annuloplasty ring present in the mitral position. MV peak gradient, 14.1 mmHg. The mean mitral valve gradient is 6.5 mmHg. Tricuspid Valve: S/p repair with an annuloplasty ring. The tricuspid valve is grossly normal. Tricuspid valve regurgitation is  mild. Aortic Valve: The aortic valve is normal in structure. Aortic valve regurgitation is not visualized. No aortic stenosis is present. Aortic valve peak gradient measures 8.2 mmHg. Pulmonic Valve: The pulmonic valve was grossly normal. Pulmonic valve regurgitation is mild. Aorta: The aortic root and ascending aorta are structurally normal, with no evidence of dilitation. Venous: The inferior vena cava is normal in size with greater than 50% respiratory variability, suggesting right atrial pressure of 3 mmHg. IAS/Shunts: No atrial level shunt detected by color flow Doppler.  LEFT VENTRICLE PLAX 2D LVIDd:         5.40 cm      Diastology LVIDs:         3.80 cm      LV e' medial:    6.53 cm/s LV PW:         1.10 cm      LV E/e' medial:  23.3 LV IVS:        0.90 cm      LV e' lateral:   5.87 cm/s LVOT diam:     2.00 cm      LV E/e' lateral: 25.9 LV SV:         63 LV SV Index:   27 LVOT Area:     3.14 cm  LV Volumes (MOD) LV vol d, MOD A2C: 103.0 ml LV vol d, MOD A4C: 119.0 ml LV vol s, MOD A2C: 58.7 ml LV vol s, MOD A4C: 67.0 ml LV SV MOD A2C:     44.3 ml LV SV MOD A4C:     119.0 ml LV SV MOD BP:      48.7 ml RIGHT VENTRICLE            IVC RV Basal diam:  3.80 cm    IVC diam: 1.40 cm RV Mid diam:    3.30 cm RV S prime:     4.47 cm/s TAPSE (M-mode): 1.2 cm LEFT ATRIUM             Index        RIGHT ATRIUM           Index LA diam:        4.80 cm 2.07 cm/m   RA Area:     20.40 cm LA Vol (A2C):   85.8 ml 37.05 ml/m  RA Volume:   53.80 ml  23.23 ml/m LA Vol (A4C):   76.9 ml 33.21 ml/m LA Biplane Vol: 81.7 ml 35.28 ml/m  AORTIC VALVE AV Area (Vmax): 2.53 cm AV Vmax:        143.00 cm/s AV Peak Grad:   8.2 mmHg LVOT Vmax:      115.00 cm/s LVOT Vmean:     75.800 cm/s LVOT VTI:       0.202 m  AORTA Ao Root diam: 2.90 cm Ao Asc diam:  2.70 cm MITRAL VALVE                TRICUSPID VALVE MV Area (PHT): 2.19 cm     TV Peak grad:   3.7 mmHg MV Area VTI:   1.58 cm     TV  Mean grad:   2.0 mmHg MV Peak grad:  14.1 mmHg    TV  Vmax:        0.97 m/s MV Mean grad:  6.5 mmHg     TV Vmean:       61.9 cm/s MV Vmax:       1.88 m/s     TV VTI:         0.19 msec MV Vmean:      123.0 cm/s   TR Peak grad:   23.4 mmHg MV Decel Time: 347 msec     TR Vmax:        242.00 cm/s MV E velocity: 152.00 cm/s                             SHUNTS                             Systemic VTI:  0.20 m                             Systemic Diam: 2.00 cm Phineas Inches Electronically signed by Phineas Inches Signature Date/Time: 07/29/2021/4:14:03 PM    Final     Scheduled Meds:  ALPRAZolam  0.5 mg Oral BID   amiodarone  200 mg Oral BID   apixaban  5 mg Oral BID   busPIRone  5 mg Oral BID   cycloSPORINE  1 drop Both Eyes BID   docusate sodium  100 mg Oral BID   DULoxetine  60 mg Oral Daily   insulin aspart  0-9 Units Subcutaneous TID WC   loratadine  10 mg Oral Daily   metoprolol succinate  37.5 mg Oral Daily   rosuvastatin  10 mg Oral QPM   sodium chloride flush  3 mL Intravenous Q12H   Continuous Infusions:   LOS: 0 days   Time spent: 35 minutes   Darliss Cheney, MD [facility]  07/30/2021, 1:17 PM  Please page via [facility] and do not message via secure chat for anything urgent. Secure chat can be used for anything non urgent.  How to contact the [facility] Attending or Consulting provider [place] or covering provider during after hours [place], for this patient?  Check the care team in [employer] and look for a) attending/consulting [facility] provider listed and b) the [facility] team listed. Page or secure chat [phone]. Log into [email or web address] and use [place] universal password to access. If you do not have the password, please contact the hospital operator. Locate the [facility] provider you are looking for under [facility] and page to a number that you can be directly reached. If you still have difficulty reaching the provider, please page the [facility] (Director on Call) for the Hospitalists listed on amion for assistance.
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
## 2021-07-31 DIAGNOSIS — I5042 Chronic combined systolic (congestive) and diastolic (congestive) heart failure: Secondary | ICD-10-CM | POA: Diagnosis not present

## 2021-07-31 DIAGNOSIS — I480 Paroxysmal atrial fibrillation: Secondary | ICD-10-CM | POA: Diagnosis not present

## 2021-07-31 DIAGNOSIS — R079 Chest pain, unspecified: Secondary | ICD-10-CM | POA: Diagnosis not present

## 2021-07-31 LAB — BASIC METABOLIC PANEL
Anion gap: 8 (ref 5–15)
BUN: 64 mg/dL — ABNORMAL HIGH (ref 8–23)
CO2: 31 mmol/L (ref 22–32)
Calcium: 9 mg/dL (ref 8.9–10.3)
Chloride: 96 mmol/L — ABNORMAL LOW (ref 98–111)
Creatinine, Ser: 3.69 mg/dL — ABNORMAL HIGH (ref 0.44–1.00)
GFR, Estimated: 13 mL/min — ABNORMAL LOW (ref >60)
Glucose, Bld: 147 mg/dL — ABNORMAL HIGH (ref 70–99)
Potassium: 3.6 mmol/L (ref 3.5–5.1)
Sodium: 135 mmol/L (ref 135–145)

## 2021-07-31 LAB — GLUCOSE, CAPILLARY
Glucose-Capillary: 145 mg/dL — ABNORMAL HIGH (ref 70–99)
Glucose-Capillary: 131 mg/dL — ABNORMAL HIGH (ref 70–99)

## 2021-07-31 NOTE — TOC Transition Note (Signed)
Transition of Care [facility]) - CM/SW Discharge Note   Patient Details  Name: [name] MRN: [id number] Date of Birth: 07/07/1956  [facility] CM/SW Contact:  Carles Collet, RN Phone Number: 07/31/2021, 9:08 AM   Clinical Narrative:    Damaris Schooner w patient to discuss DC plan. HH OT will not see patyient w/o other discipline, no other discipline needed and patient confirms that that she is ambulatory and without transportation issues and able to get to outpt OT. Referral placed to [facility]. Reviewed patient to call office to expedite appointment time. Referral made electronically and added to AVS. No DME needs     Final next level of care: Home/Self Care Barriers to Discharge: No Barriers Identified   Patient Goals and CMS Choice Patient states their goals for this hospitalization and ongoing recovery are:: to go home      Discharge Placement                       Discharge Plan and Services                                     Social Determinants of Health (SDOH) Interventions     Readmission Risk Interventions Readmission Risk Prevention Plan 02/10/2021 01/11/2021 03/08/2020  Transportation Screening Complete Complete Complete  Medication Review Press photographer) Complete - Complete  PCP or Specialist appointment within 3-5 days of discharge Complete Complete Complete  HRI or Home Care Consult Complete Complete Complete  SW Recovery Care/Counseling Consult Complete Complete Complete  Palliative Care Screening Not Applicable Not Applicable Not [place] Not Applicable Not Applicable Not Applicable  Some recent data might be hidden

## 2021-07-31 NOTE — Discharge Summary (Signed)
Physician Discharge Summary  Tyrika Newman HFW:[id number] DOB: 1955/10/27 DOA: 07/28/2021  PCP: [name], NP  Admit date: 07/28/2021 Discharge date: 07/31/2021    Admitted From: Home Disposition: Home  Recommendations for Outpatient Follow-up:  Follow up with PCP in 1-2 weeks Please obtain BMP/CBC in one week Follow-up with primary cardiologist. Follow-up with primary nephrologist in 1 to 2 weeks Please follow up with your PCP on the following pending results: Unresulted Labs (From admission, onward)     Start     Ordered   07/30/21 0277  Basic metabolic panel  Daily,   R      07/29/21 [place]: Yes Equipment/Devices: None  Discharge Condition: Stable CODE STATUS: Full code Diet recommendation: Cardiac  Subjective: Seen and examined.  Still complains of dyspnea on exertion but she looks extremely comfortable and euvolemic.  Following HPI and ED course is copied from my colleague admitting hospitalist Dr. Thompson Caul H&P. HPI: [name] is a 65 y.o. 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  She notes symptoms have been present now for multiple weeks.  Complains of having a left substernal chest pain that she describes as sharp and pressure that feels like an elephant is sitting on her chest.  She rates pain as a 8 out of 10 on the pain scale.  Shortness of breath is worsened with exertion or whenever she is laying flat.  States that she has been abiding by the fluid restriction, but her weight has been fluctuating between 282 -286 pounds, but previously had been around 266 after open heart surgery in May of this year.  Associated symptoms have included lower extremity  swelling unchanged, palpitations, and 3 episodes of emesis.  Denies having any significant cough, wheezing, fever, dysuria, abdominal pain, or diarrhea.  Patient reported trying to take Tylenol as well as nitroglycerin without relief in chest pain.  She had been seen last by Dr. Quentin Ore 2 days ago, and was recommended to continue on amiodarone twice daily for another week prior to dropping back down to 200 mg daily.  Review of records note that if she continued to have struggles with maintaining normal rhythm discussions about pacemaker/AV ablation in the future.   ED Course: Upon admission into the emergency department patient was seen to be afebrile, pulse 55-94, respirations 16-26, blood pressures 91/74-142/60, and O2 saturations currently maintained on room air.  Labs significant for potassium 3.4 BUN 31, creatinine 3.66, BNP 217.4, and high-sensitivity troponin negative x2.  Cardiomegaly with suspected central pulmonary vascular congestion without consolidation or frank edema.  COVID-19 and influenza screening were negative.  Patient had initially been given Lasix 100 mg p.o. milligrams IV, Zofran, Reglan, and Benadryl.  Brief/Interim Summary:  Chest painCAD:  EKG notes atrial fibrillation without significant ischemic changes.  High-sensitivity troponins were negative x2.  Previous cardiac cath from 2019 noted mild nonobstructive coronary artery disease.  Cardiology consulted.  She underwent RHC which showed normal filling pressures.  She has point tenderness on the chest as well indicating musculoskeletal pain.  ACS ruled out and cardiology cleared her for discharge.   Chronic combined systolic and diastolic CHF/dyspnea:  Patient report weight has been anywhere from 282-286 lbs over the last  few weeks.  Per cardiology note who she is very well-known to, her weight is actually down from previous clinic visits.  Chest x-ray noted cardiomegaly without frank edema.  BNP was noted to be elevated at 217.4  which is also close to her baseline.  Last echocardiogram revealed EF of 40-45% in 02/2021.  Underwent RHC which shows normal filling pressure with mild PAH and moderately reduced cardiac output likely due in part to low fluid status.  Per cardiology, her dyspnea is not likely secondary to CHF and she is not fluid overloaded.  They cleared her for discharge.  She is on Eliquis already so I do not think she will have any PE.  Although she is complaining of shortness of breath but she is not hypoxic, lungs are clear to auscultation and she looks very comfortable.   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  Previously, it was presumed that her baseline creatinine was between 2.6 and 3.1 but when reviewed the chart in more detail, it appears that her creatinine this year has ranged anywhere from 2.2-3.5.  She presented with 3.7 and has remained stable since past 5 days, indicating that this might be her new baseline with GFR of 13 making it CKD stage V.  She has had good urine output.  Does not appear to be fluid overloaded.  Recommend to follow-up with nephrology.   Paroxysmal atrial fibrillation/flutter: Rates controlled, she is currently on amiodarone 200 twice daily, Eliquis and Toprol-XL 37.5 mg.  Resume home medications.   Nausea and vomiting: Resolved.   Hypokalemia: Resolved.   Prolonged QT interval: QTC was 526. 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 -Albuterol nebs as needed for shortness of breath/wheezing   Essential hypertension: Blood pressures currently stable. -Continue metoprolol as tolerated   Diabetes mellitus type 2: Last hemoglobin A1c 5.8 on 05/04/2011.  Resume home  medications.   Anxiety and depression -Continue Cymbalta, BuSpar, and Xanax as needed   Hyperlipidemia -Continue Crestor   OSA: Patient has a sleep study pending for CPAP titration -Continue CPAP nightly   GERD -Held Protonix due to prolonged QT interval   Morbid obesity: BMI 43.09 kg/m.  Weight loss encouraged  Discharge Diagnoses:  Active Problems:   Diabetes mellitus (HCC)   Morbid obesity (HCC)   AF (paroxysmal atrial fibrillation) (HCC)   OSA (obstructive sleep apnea)   Chest pain   Nausea and vomiting   Hypokalemia   Acute kidney injury superimposed on CKD (HCC)   Chronic combined systolic and diastolic heart failure (HCC)   COPD (chronic obstructive pulmonary disease) (HCC)   Prolonged QT interval    Discharge Instructions  Discharge Instructions     Ambulatory referral to Occupational Therapy   Complete by: As directed    Decreased activity tolerance;Impaired balance (sitting and/or standing);Decreased strength;Decreased knowledge of use of DME or AE;Decreased knowledge of precautions;Decreased safety awareness;Cardiopulmonary status limiting activity      Allergies as of 07/31/2021       Reactions   Bee Pollen Anaphylaxis   Sulfa Antibiotics Itching        Medication List     STOP taking these medications    HYDROcodone-acetaminophen 5-325 MG tablet Commonly known as: NORCO/VICODIN  TAKE these medications    Accu-Chek Guide Me w/Device Kit Use as instructed to check blood sugar three times daily. E11.69 What changed: Another medication with the same name was removed. Continue taking this medication, and follow the directions you see here.   Accu-Chek Guide test strip Generic drug: glucose blood Use to check blood sugar TID. E11.69   acetaminophen 500 MG tablet Commonly known as: TYLENOL Take 1,000 mg by mouth every 6 (six) hours as needed for mild pain.   albuterol 108 (90 Base) MCG/ACT inhaler Commonly known as: VENTOLIN  HFA Inhale 2 puffs into the lungs every 4 (four) hours as needed for wheezing or shortness of breath (Asthma).   ALPRAZolam 0.5 MG tablet Commonly known as: XANAX Take 1 tablet (0.5 mg total) by mouth 2 (two) times daily as needed for anxiety. What changed: when to take this   amiodarone 200 MG tablet Commonly known as: PACERONE Take 1 tablet (200 mg total) by mouth daily for 7 days, THEN 1 tablet (200 mg total) daily. Start taking on: July 27, 2021 What changed: See the new instructions.   apixaban 5 MG Tabs tablet Commonly known as: Eliquis Take 1 tablet (5 mg total) by mouth 2 (two) times daily.   B-D SINGLE USE SWABS REGULAR Pads 1 each by Other route daily.   blood glucose meter kit and supplies Dispense based on patient and insurance preference. Use up to four times daily as directed. (FOR ICD-10 E10.9, E11.9).   blood glucose meter kit and supplies Kit Dispense based on patient and insurance preference. Use up to four times daily as directed.   busPIRone 5 MG tablet Commonly known as: BUSPAR Take 1 tablet (5 mg total) by mouth 2 (two) times daily.   colchicine 0.6 MG tablet Taking 1/2 tablet by mouth daily   docusate sodium 100 MG capsule Commonly known as: COLACE Take 100 mg by mouth 2 (two) times daily.   DULoxetine 60 MG capsule Commonly known as: CYMBALTA Take 1 capsule (60 mg total) by mouth daily.   EPINEPHrine 0.3 mg/0.3 mL Soaj injection Commonly known as: EPI-PEN Inject 0.3 mg into the muscle as needed for anaphylaxis.   fluticasone 50 MCG/ACT nasal spray Commonly known as: FLONASE Place 2 sprays into both nostrils daily. What changed:  when to take this reasons to take this   insulin aspart 100 UNIT/ML injection Commonly known as: NovoLOG Inject 12 Units into the skin 3 (three) times daily before meals. For blood sugars 0-199 give 0 units of insulin, 201-250 give 4 units, 251-300 give 6 units, 301-350 give 8 units, 351-400 give 10 units,> 400  give 12 units and call M.D. What changed:  how much to take additional instructions   loratadine 10 MG tablet Commonly known as: CLARITIN Take 1 tablet (10 mg total) by mouth daily.   metolazone 2.5 MG tablet Commonly known as: ZAROXOLYN Take 1 tablet (2.5 mg total) by mouth 2 (two) times a week. Take every Monday and Friday   metoprolol succinate 25 MG 24 hr tablet Commonly known as: Toprol XL Take 1.5 tablets (37.5 mg total) by mouth daily.   MULTIVITAMIN WOMEN PO Take 1 tablet by mouth daily.   nitroGLYCERIN 0.4 MG SL tablet Commonly known as: NITROSTAT Place 1 tablet (0.4 mg total) under the tongue every 5 (five) minutes x 3 doses as needed for chest pain.   pantoprazole 40 MG tablet Commonly known as: PROTONIX TAKE 1 TABLET BY MOUTH TWICE A DAY What changed:  when to take this   potassium chloride SA 20 MEQ tablet Commonly known as: KLOR-CON M Take 4 tablets (80 mEq total) by mouth daily. Take an additional 40 meq with every dose of METOLAZONE What changed:  how much to take when to take this additional instructions   Restasis 0.05 % ophthalmic emulsion Generic drug: cycloSPORINE Place 1 drop into both eyes 2 (two) times daily.   rosuvastatin 10 MG tablet Commonly known as: CRESTOR Take 1 tablet (10 mg total) by mouth daily. What changed: when to take this   torsemide 100 MG tablet Commonly known as: DEMADEX Take 100 mg by mouth daily.   Trulicity 1.5 ZO/1.0RU Sopn Generic drug: Dulaglutide Inject 1.5 mg into the skin once a week. What changed: additional instructions        Follow-up Information     [name], NP Follow up in 1 week(s).   Specialty: Internal Medicine Contact information: [place] [address] [phone]         Buford Dresser, MD .   Specialty: Cardiology Contact information: [address] [place] [address] [phone]         Vickie Epley, MD .    Specialties: Cardiology, Radiology Contact information: [address] [place] [address] [phone]         [facility] Follow up.   Specialty: Rehabilitation Why: Call Monday to schedule an appointment. A referral has been made electronically for you Contact information: [address] [id number] [place] [address] [phone]               Allergies  Allergen Reactions   Bee Pollen Anaphylaxis   Sulfa Antibiotics Itching    Consultations: Cardiology   Procedures/Studies: DG Chest 2 View  Result Date: 07/28/2021 CLINICAL DATA:  Chest pain. Additional history provided by scanning technologist: Patient reports chest pain with associated shortness of breath/dyspnea on exertion. Pain described as sharp. Pain radiates to mid back. EXAM: CHEST - 2 VIEW COMPARISON:  Prior chest radiographs 06/21/2021 and earlier. Chest CT 06/21/2021. FINDINGS: Prior median sternotomy and left atrial clipping. Prosthetic cardiac valve. Central pulmonary vascular congestion is suspected. However, there is no appreciable airspace consolidation or frank pulmonary edema. No evidence of pleural effusion or pneumothorax. No acute bony abnormality identified. Surgical clips within the upper abdomen. IMPRESSION: Cardiomegaly with suspected central pulmonary vascular congestion. No appreciable airspace consolidation or frank pulmonary edema. Electronically Signed   By: Kellie Simmering D.O.   On: 07/28/2021 15:53   CARDIAC CATHETERIZATION  Result Date: 07/29/2021 Findings: RA = 5 RV = 36/8 PA = 40/11 (26) PCW = 13 Fick cardiac output/index = 4.3/1.9 PVR = 3.0 WU FA sat = 95% PA sat = 52%, 55% Assessment: Normal filling pressures with mild PAH Moderately reduced cardiac out likely due in part to low fluid status Plan/Discussion: Volume status normal to low. Dyspnea likely not related to HF. Would hold diuretics for now and resume when  Scr improving. We will follow as outpatient. Glori Bickers, MD 5:06 PM  ECHOCARDIOGRAM COMPLETE  Result Date: 07/29/2021    ECHOCARDIOGRAM REPORT   Patient Name:   [name] Date of Exam: 07/29/2021 Medical Rec #:  [id number]            Height:       67.0 in Accession #:    [id number]           Weight:  275.1 lb Date of Birth:  [birth year]            BSA:          2.316 m Patient Age:    65 years             BP:           122/80 mmHg Patient Gender: F                    HR:           90 bpm. Exam Location:  Inpatient Procedure: 2D Echo, Cardiac Doppler and Color Doppler Indications:    Chest pain  History:        Patient has prior history of Echocardiogram examinations. COPD,                 Arrythmias:Atrial Fibrillation; Risk Factors:Diabetes and Sleep                 Apnea. TV ring, MV ring.                  Mitral Valve: 30 mm prosthetic annuloplasty ring valve is                 present in the mitral position.  Sonographer:    Jyl Heinz Referring Phys: [id number] RONDELL A SMITH IMPRESSIONS  1. Left ventricular ejection fraction, by estimation, is 40 to 45%. The left ventricle has mildly decreased function. The left ventricle demonstrates global hypokinesis. There is mild left ventricular hypertrophy. Left ventricular diastolic parameters are indeterminate.  2. Right ventricular systolic function is mildly reduced. The right ventricular size is normal. There is normal pulmonary artery systolic pressure.  3. Left atrial size was moderately dilated.  4. Right atrial size was mildly dilated.  5. HR 90 bpm. MV peak gradient, 14.1 mmHg. The mean mitral valve gradient is 6.5 mmHg     . There is a 30 mm prosthetic annuloplasty ring present in the mitral position.  6. S/p repair with an annuloplasty ring.  7. The aortic valve is normal in structure. Aortic valve regurgitation is not visualized. No aortic stenosis is present.  8. The inferior vena cava is normal in size with greater than 50%  respiratory variability, suggesting right atrial pressure of 3 mmHg. Conclusion(s)/Recommendation(s): Compared to prior echo 02/28/2021, PA pressure reduced. FINDINGS  Left Ventricle: Left ventricular ejection fraction, by estimation, is 40 to 45%. The left ventricle has mildly decreased function. The left ventricle demonstrates global hypokinesis. The left ventricular internal cavity size was normal in size. There is  mild left ventricular hypertrophy. Abnormal (paradoxical) septal motion consistent with post-operative status. Left ventricular diastolic parameters are indeterminate. Right Ventricle: The right ventricular size is normal. No increase in right ventricular wall thickness. Right ventricular systolic function is mildly reduced. There is normal pulmonary artery systolic pressure. The tricuspid regurgitant velocity is 2.42 m/s, and with an assumed right atrial pressure of 3 mmHg, the estimated right ventricular systolic pressure is 45.4 mmHg. Left Atrium: Left atrial size was moderately dilated. Right Atrium: Right atrial size was mildly dilated. Pericardium: There is no evidence of pericardial effusion. Mitral Valve: HR 90 bpm. MV peak gradient, 14.1 mmHg. The mean mitral valve gradient is 6.5 mmHg. There is a 30 mm prosthetic annuloplasty ring present in the mitral position. MV peak gradient, 14.1 mmHg. The mean mitral valve gradient is 6.5 mmHg. Tricuspid Valve: S/p repair with an annuloplasty ring. The tricuspid valve  is grossly normal. Tricuspid valve regurgitation is mild. Aortic Valve: The aortic valve is normal in structure. Aortic valve regurgitation is not visualized. No aortic stenosis is present. Aortic valve peak gradient measures 8.2 mmHg. Pulmonic Valve: The pulmonic valve was grossly normal. Pulmonic valve regurgitation is mild. Aorta: The aortic root and ascending aorta are structurally normal, with no evidence of dilitation. Venous: The inferior vena cava is normal in size with greater  than 50% respiratory variability, suggesting right atrial pressure of 3 mmHg. IAS/Shunts: No atrial level shunt detected by color flow Doppler.  LEFT VENTRICLE PLAX 2D LVIDd:         5.40 cm      Diastology LVIDs:         3.80 cm      LV e' medial:    6.53 cm/s LV PW:         1.10 cm      LV E/e' medial:  23.3 LV IVS:        0.90 cm      LV e' lateral:   5.87 cm/s LVOT diam:     2.00 cm      LV E/e' lateral: 25.9 LV SV:         63 LV SV Index:   27 LVOT Area:     3.14 cm  LV Volumes (MOD) LV vol d, MOD A2C: 103.0 ml LV vol d, MOD A4C: 119.0 ml LV vol s, MOD A2C: 58.7 ml LV vol s, MOD A4C: 67.0 ml LV SV MOD A2C:     44.3 ml LV SV MOD A4C:     119.0 ml LV SV MOD BP:      48.7 ml RIGHT VENTRICLE            IVC RV Basal diam:  3.80 cm    IVC diam: 1.40 cm RV Mid diam:    3.30 cm RV S prime:     4.47 cm/s TAPSE (M-mode): 1.2 cm LEFT ATRIUM             Index        RIGHT ATRIUM           Index LA diam:        4.80 cm 2.07 cm/m   RA Area:     20.40 cm LA Vol (A2C):   85.8 ml 37.05 ml/m  RA Volume:   53.80 ml  23.23 ml/m LA Vol (A4C):   76.9 ml 33.21 ml/m LA Biplane Vol: 81.7 ml 35.28 ml/m  AORTIC VALVE AV Area (Vmax): 2.53 cm AV Vmax:        143.00 cm/s AV Peak Grad:   8.2 mmHg LVOT Vmax:      115.00 cm/s LVOT Vmean:     75.800 cm/s LVOT VTI:       0.202 m  AORTA Ao Root diam: 2.90 cm Ao Asc diam:  2.70 cm MITRAL VALVE                TRICUSPID VALVE MV Area (PHT): 2.19 cm     TV Peak grad:   3.7 mmHg MV Area VTI:   1.58 cm     TV Mean grad:   2.0 mmHg MV Peak grad:  14.1 mmHg    TV Vmax:        0.97 m/s MV Mean grad:  6.5 mmHg     TV Vmean:       61.9 cm/s MV Vmax:  1.88 m/s     TV VTI:         0.19 msec MV Vmean:      123.0 cm/s   TR Peak grad:   23.4 mmHg MV Decel Time: 347 msec     TR Vmax:        242.00 cm/s MV E velocity: 152.00 cm/s                             SHUNTS                             Systemic VTI:  0.20 m                             Systemic Diam: 2.00 cm Phineas Inches Electronically signed by  Phineas Inches Signature Date/Time: 07/29/2021/4:14:03 PM    Final    VAS [place] UPPER EXTREMITY VENOUS DUPLEX  Result Date: 07/01/2021 UPPER VENOUS STUDY  Patient Name:  KARIANNA GUSMAN  Date of Exam:   07/01/2021 Medical Rec #: [id number]             Accession #:    [id number] Date of Birth: 02-Oct-1955             Patient Gender: F Patient Age:   50 years Exam Location:  [facility] Procedure:      VAS [place] UPPER EXTREMITY VENOUS DUPLEX Referring Phys: BRIDGETTE CHRISTOPHER --------------------------------------------------------------------------------  Indications: Patient involved in [place] on 06/20/2021 and injured right arm. Now has large hematoma with palpable mass on anterolateral right humerus. She denies increased chest pain or SOB. Anticoagulation: Eliquis. Comparison Study: None Performing Technologist: Alecia Mackin RVT, RDCS (AE), RDMS  Examination Guidelines: A complete evaluation includes B-mode imaging, spectral Doppler, color Doppler, and power Doppler as needed of all accessible portions of each vessel. Bilateral testing is considered an integral part of a complete examination. Limited examinations for reoccurring indications may be performed as noted.  Right Findings: +----------+------------+---------+-----------+----------+-------+ RIGHT     CompressiblePhasicitySpontaneousPropertiesSummary +----------+------------+---------+-----------+----------+-------+ IJV           Full       Yes       Yes                      +----------+------------+---------+-----------+----------+-------+ Subclavian               Yes       Yes                      +----------+------------+---------+-----------+----------+-------+ Axillary      Full       Yes       Yes                      +----------+------------+---------+-----------+----------+-------+ Brachial      Full       Yes       Yes                      +----------+------------+---------+-----------+----------+-------+ Cephalic       Full       Yes       Yes                      +----------+------------+---------+-----------+----------+-------+ Basilic       Full  Yes       Yes                      +----------+------------+---------+-----------+----------+-------+  Left Findings: +----+------------+---------+-----------+----------+-------+ LEFTCompressiblePhasicitySpontaneousPropertiesSummary +----+------------+---------+-----------+----------+-------+ IJV     Full       Yes       Yes                      +----+------------+---------+-----------+----------+-------+  Summary:  Right: No evidence of deep vein thrombosis in the upper extremity. No evidence of superficial vein thrombosis in the upper extremity.  Heterogenous subcutaneous tissue seen just below skin surface measuring approximately 6.1 x 5.9 x 1.6 cm, most likely representing a hematoma.  *See table(s) above for measurements and observations.  Diagnosing physician: Ida Rogue MD Electronically signed by Ida Rogue MD on 07/01/2021 at 11:57:36 PM.    Final      Discharge Exam: Vitals:   07/31/21 0100 07/31/21 0300  BP: 121/67 117/67  Pulse:  77  Resp:  15  Temp:  97.8 F (36.6 C)  SpO2:  98%   Vitals:   07/30/21 2112 07/31/21 0000 07/31/21 0100 07/31/21 0300  BP: (!) 124/49 118/63 121/67 117/67  Pulse:    77  Resp: 16   15  Temp:    97.8 F (36.6 C)  TempSrc:    Oral  SpO2:    98%  Weight:    124.8 kg  Height:        General: Pt is alert, awake, not in acute distress Cardiovascular: RRR, S1/S2 +, no rubs, no gallops Respiratory: CTA bilaterally, no wheezing, no rhonchi Abdominal: Soft, NT, ND, bowel sounds + Extremities: no edema, no cyanosis    The results of significant diagnostics from this hospitalization (including imaging, microbiology, ancillary and laboratory) are listed below for reference.     Microbiology: Recent Results (from the past 240 hour(s))  Resp Panel by RT-PCR (Flu A&B, Covid)  Nasopharyngeal Swab     Status: None   Collection Time: 07/28/21  7:51 PM   Specimen: Nasopharyngeal Swab; Nasopharyngeal(NP) swabs in vial transport medium  Result Value Ref Range Status   SARS Coronavirus 2 by RT PCR NEGATIVE NEGATIVE Final    Comment: (NOTE) SARS-CoV-2 target nucleic acids are NOT DETECTED.  The SARS-CoV-2 RNA is generally detectable in upper respiratory specimens during the acute phase of infection. The lowest concentration of SARS-CoV-2 viral copies this assay can detect is 138 copies/mL. A negative result does not preclude SARS-Cov-2 infection and should not be used as the sole basis for treatment or other patient management decisions. A negative result may occur with  improper specimen collection/handling, submission of specimen other than nasopharyngeal swab, presence of viral mutation(s) within the areas targeted by this assay, and inadequate number of viral copies(<138 copies/mL). A negative result must be combined with clinical observations, patient history, and epidemiological information. The expected result is Negative.  Fact Sheet for Patients:  EntrepreneurPulse.com.au  Fact Sheet for Healthcare Providers:  [employer]  This test is no t yet approved or cleared by the [place] FDA and  has been authorized for detection and/or diagnosis of SARS-CoV-2 by FDA under an Emergency Use Authorization (EUA). This EUA will remain  in effect (meaning this test can be used) for the duration of the COVID-19 declaration under Section 564(b)(1) of the Act, 21 U.S.C.section 360bbb-3(b)(1), unless the authorization is terminated  or revoked sooner.       Influenza A by PCR  NEGATIVE NEGATIVE Final   Influenza B by PCR NEGATIVE NEGATIVE Final    Comment: (NOTE) The Xpert Xpress SARS-CoV-2/FLU/RSV plus assay is intended as an aid in the diagnosis of influenza from Nasopharyngeal swab specimens and should not be  used as a sole basis for treatment. Nasal washings and aspirates are unacceptable for Xpert Xpress SARS-CoV-2/FLU/RSV testing.  Fact Sheet for Patients: EntrepreneurPulse.com.au  Fact Sheet for Healthcare Providers: IncredibleEmployment.be  This test is not yet approved or cleared by the [place] FDA and has been authorized for detection and/or diagnosis of SARS-CoV-2 by FDA under an Emergency Use Authorization (EUA). This EUA will remain in effect (meaning this test can be used) for the duration of the COVID-19 declaration under Section 564(b)(1) of the Act, 21 U.S.C. section 360bbb-3(b)(1), unless the authorization is terminated or revoked.  Performed at [facility], [address] [place], [place] [address]      Labs: BNP (last 3 results) Recent Labs    05/16/21 1247 05/19/21 1400 07/28/21 1515  BNP 188.7* 285.3* 217.4*    Basic Metabolic Panel: Recent Labs  Lab 07/28/21 1515 07/29/21 1119 07/29/21 1649 07/30/21 0243 07/30/21 2213 07/31/21 0156  NA 139 138 141  142 135 137 135  K 3.4* 3.4* 3.3*  3.1* 2.9* 4.3 3.6  CL 93* 94*  --  95* 96* 96*  CO2 34* 33*  --  30 33* 31  GLUCOSE 108* 163*  --  163* 142* 147*  BUN 51* 55*  --  63* 65* 64*  CREATININE 3.66* 3.73*  --  3.67* 3.82* 3.69*  CALCIUM 10.1 9.7  --  8.9 9.2 9.0  MG  --   --   --  2.3 2.2  --     Liver Function Tests: No results for input(s): AST, ALT, ALKPHOS, BILITOT, PROT, ALBUMIN in the last 168 hours. No results for input(s): LIPASE, AMYLASE in the last 168 hours. No results for input(s): AMMONIA in the last 168 hours. CBC: Recent Labs  Lab 07/28/21 1515 07/29/21 1649 07/30/21 0243  WBC 5.0  --  5.4  HGB 12.8 12.6  12.2 12.0  HCT 40.3 37.0  36.0 37.4  MCV 89.6  --  88.4  PLT 220  --  195    Cardiac Enzymes: No results for input(s): CKTOTAL, CKMB, CKMBINDEX, TROPONINI in the last 168 hours. BNP: Invalid input(s):  POCBNP CBG: Recent Labs  Lab 07/30/21 0605 07/30/21 1121 07/30/21 1633 07/30/21 2057 07/31/21 0601  GLUCAP 143* 161* 170* 160* 145*    D-Dimer No results for input(s): DDIMER in the last 72 hours. Hgb A1c Recent Labs    07/29/21 1508  HGBA1C 6.2*    Lipid Profile No results for input(s): CHOL, HDL, LDLCALC, TRIG, CHOLHDL, LDLDIRECT in the last 72 hours. Thyroid function studies No results for input(s): TSH, T4TOTAL, T3FREE, THYROIDAB in the last 72 hours.  Invalid input(s): FREET3 Anemia work up No results for input(s): VITAMINB12, FOLATE, FERRITIN, TIBC, IRON, RETICCTPCT in the last 72 hours. Urinalysis    Component Value Date/Time   COLORURINE YELLOW 07/29/2021 1331   APPEARANCEUR CLEAR 07/29/2021 1331   LABSPEC 1.020 07/29/2021 1331   PHURINE 6.0 07/29/2021 1331   GLUCOSEU NEGATIVE 07/29/2021 1331   HGBUR NEGATIVE 07/29/2021 1331   BILIRUBINUR NEGATIVE 07/29/2021 1331   BILIRUBINUR negative 09/24/2019 1742   KETONESUR NEGATIVE 07/29/2021 1331   PROTEINUR NEGATIVE 07/29/2021 1331   UROBILINOGEN 1.0 09/24/2019 1742   UROBILINOGEN 1.0 10/26/2014 0249   NITRITE NEGATIVE 07/29/2021 1331  LEUKOCYTESUR NEGATIVE 07/29/2021 1331   Sepsis Labs Invalid input(s): PROCALCITONIN,  WBC,  LACTICIDVEN Microbiology Recent Results (from the past 240 hour(s))  Resp Panel by RT-PCR (Flu A&B, Covid) Nasopharyngeal Swab     Status: None   Collection Time: 07/28/21  7:51 PM   Specimen: Nasopharyngeal Swab; Nasopharyngeal(NP) swabs in vial transport medium  Result Value Ref Range Status   SARS Coronavirus 2 by RT PCR NEGATIVE NEGATIVE Final    Comment: (NOTE) SARS-CoV-2 target nucleic acids are NOT DETECTED.  The SARS-CoV-2 RNA is generally detectable in upper respiratory specimens during the acute phase of infection. The lowest concentration of SARS-CoV-2 viral copies this assay can detect is 138 copies/mL. A negative result does not preclude SARS-Cov-2 infection and should  not be used as the sole basis for treatment or other patient management decisions. A negative result may occur with  improper specimen collection/handling, submission of specimen other than nasopharyngeal swab, presence of viral mutation(s) within the areas targeted by this assay, and inadequate number of viral copies(<138 copies/mL). A negative result must be combined with clinical observations, patient history, and epidemiological information. The expected result is Negative.  Fact Sheet for Patients:  EntrepreneurPulse.com.au  Fact Sheet for Healthcare Providers:  IncredibleEmployment.be  This test is no t yet approved or cleared by the [place] FDA and  has been authorized for detection and/or diagnosis of SARS-CoV-2 by FDA under an Emergency Use Authorization (EUA). This EUA will remain  in effect (meaning this test can be used) for the duration of the COVID-19 declaration under Section 564(b)(1) of the Act, 21 U.S.C.section 360bbb-3(b)(1), unless the authorization is terminated  or revoked sooner.       Influenza A by PCR NEGATIVE NEGATIVE Final   Influenza B by PCR NEGATIVE NEGATIVE Final    Comment: (NOTE) The Xpert Xpress SARS-CoV-2/FLU/RSV plus assay is intended as an aid in the diagnosis of influenza from Nasopharyngeal swab specimens and should not be used as a sole basis for treatment. Nasal washings and aspirates are unacceptable for Xpert Xpress SARS-CoV-2/FLU/RSV testing.  Fact Sheet for Patients: EntrepreneurPulse.com.au  Fact Sheet for Healthcare Providers: IncredibleEmployment.be  This test is not yet approved or cleared by the [place] FDA and has been authorized for detection and/or diagnosis of SARS-CoV-2 by FDA under an Emergency Use Authorization (EUA). This EUA will remain in effect (meaning this test can be used) for the duration of the COVID-19 declaration under  Section 564(b)(1) of the Act, 21 U.S.C. section 360bbb-3(b)(1), unless the authorization is terminated or revoked.  Performed at [facility], [address] [place], [place] [address]      Time coordinating discharge: Over 30 minutes  SIGNED:   Darliss Cheney, MD  [facility] 07/31/2021, 9:50 AM  If 7PM-7AM, please contact night-coverage [email or web address]

## 2021-08-01 ENCOUNTER — Encounter (HOSPITAL_COMMUNITY): Payer: Self-pay | Admitting: Internal Medicine

## 2021-08-01 ENCOUNTER — Telehealth: Payer: Self-pay

## 2021-08-01 MED FILL — Heparin Sod (Porcine)-NaCl IV Soln 1000 Unit/500ML-0.9%: INTRAVENOUS | Qty: 500 | Status: AC

## 2021-08-01 NOTE — Telephone Encounter (Signed)
Transition Care Management Unsuccessful Follow-up Telephone Call  Date of discharge and from where:  07/31/2021, [facility]   Attempts:  1st Attempt  Reason for unsuccessful TCM follow-up call:  Left voice message on # [phone].

## 2021-08-02 ENCOUNTER — Telehealth: Payer: Self-pay

## 2021-08-02 NOTE — Telephone Encounter (Signed)
Transition Care Management Unsuccessful Follow-up Telephone Call  Date of discharge and from where:  07/31/2021, [facility]   Attempts:  2nd Attempt  Reason for unsuccessful TCM follow-up call:  Left voice message  on # [phone].  Call back requested.    Patient has appointment with Juluis Mire, NP @ [facility]- 08/24/2021

## 2021-08-03 ENCOUNTER — Other Ambulatory Visit (HOSPITAL_COMMUNITY): Payer: Medicare Other

## 2021-08-03 ENCOUNTER — Telehealth: Payer: Self-pay

## 2021-08-03 NOTE — Telephone Encounter (Signed)
Transition Care Management Unsuccessful Follow-up Telephone Call  Date of discharge and from where:  07/31/2021, [facility]   Attempts:  3rd Attempt  Reason for unsuccessful TCM follow-up call:  Left voice message  on # [phone].  Call back requested.     Patient has appointment with Juluis Mire, NP @ [facility]- 08/24/2021

## 2021-08-04 ENCOUNTER — Telehealth (HOSPITAL_COMMUNITY): Payer: Self-pay

## 2021-08-04 NOTE — Telephone Encounter (Signed)
[name] called to cancel todays paramedicine visit as she reports she has had a family emergency and wants to reschedule for next week. I will follow up next week. Call complete.

## 2021-08-11 ENCOUNTER — Other Ambulatory Visit (HOSPITAL_COMMUNITY): Payer: Self-pay

## 2021-08-11 NOTE — Progress Notes (Signed)
Paramedicine Encounter    Patient ID: [name], female    DOB: September 22, 1955, 65 y.o.   MRN: [id number]   Arrived for home visit for [name] who reports feeling okay she stated she has had some chest pressure continued since her discharge from hospital along with some shortness of breath. She says her breathing worsens with activity. She appeared today with a normal breathing effort with clear lung sounds.   Assessment and vitals obtained. [name] filled pill box on her own prior to my arrival. I checked same for accuracy. It was correct.   [name] should be following up with [facility], they will be calling me to assist her in scheduling same over the next week.   [name] was reminded of upcoming appointments and agreed with plan.   I will see [name] in one week. Home visit complete.  Refills: Potassium Metoprolol         Patient Care Team: [name], NP as PCP - General (Internal Medicine) Buford Dresser, MD as PCP - Cardiology (Cardiology) Vickie Epley, MD as PCP - Electrophysiology (Cardiology) Charolette Forward, MD as Referring Physician (Internal Medicine)  Patient Active Problem List   Diagnosis Date Noted   COPD (chronic obstructive pulmonary disease) ([place]) 07/29/2021   Prolonged QT interval 50/04/3817   Acute systolic CHF (congestive heart failure) ([place]) 07/28/2021   Biventricular heart failure ([place])    Acute kidney injury ([place]) 05/02/2021   Acute on chronic systolic heart failure ([place]) 02/25/2021   Atrial flutter ([place])    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   Encounter for preoperative dental examination    Teeth missing    Gingivitis    Accretions on teeth    CHF exacerbation ([place]) 12/21/2020   Acute on chronic heart failure ([place]) 12/19/2020   Atrial fibrillation, permanent ([place]) 11/02/2020   Chronic combined systolic and diastolic heart failure ([place]) 05/06/2020   NICM  (nonischemic cardiomyopathy) ([place]) 04/01/2020   Family history of heart disease 04/01/2020   Mitral regurgitation 04/01/2020   Severe tricuspid regurgitation 04/01/2020   Closed right ankle fracture 09/24/2019   DKA, type 2 ([place]) 09/24/2019   [place] (hyperglycemic hyperosmolar nonketotic coma) ([place]) 02/21/2019   Hypokalemia 02/21/2019   Acute kidney injury superimposed on CKD ([place]) 02/21/2019   Acute on chronic left systolic heart failure ([place]) 07/01/2018   Congestive heart failure with left ventricular diastolic dysfunction, acute ([place]) 07/01/2018   Right upper lobe pneumonia 05/14/2018   Atrial fibrillation with RVR ([place]) 05/14/2018   Chronic atrial fibrillation ([place]) 29/93/7169   Diastolic dysfunction 67/89/3810   Diabetes mellitus type 2 in obese ([place]) 12/26/2017   Nausea and vomiting    Chest pain 02/26/2012   OSA (obstructive sleep apnea) 09/21/2011   Hypoxia 09/21/2011   Diabetes mellitus ([place]) 02/04/2010   Morbid obesity ([place]) 02/04/2010   AF (paroxysmal atrial fibrillation) ([place]) 02/04/2010   Gastroparesis 02/04/2010   Acute gastroenteritis 02/04/2010    Current Outpatient Medications:    acetaminophen (TYLENOL) 500 MG tablet, Take 1,000 mg by mouth every 6 (six) hours as needed for mild pain., Disp: , Rfl:    albuterol (VENTOLIN HFA) 108 (90 Base) MCG/ACT inhaler, Inhale 2 puffs into the lungs every 4 (four) hours as needed for wheezing or shortness of breath (Asthma)., Disp: , Rfl:    Alcohol Swabs (B-D SINGLE USE SWABS REGULAR) PADS, 1 each by Other route daily., Disp: 100 each, Rfl: 11   ALPRAZolam (XANAX) 0.5 MG  tablet, Take 1 tablet (0.5 mg total) by mouth 2 (two) times daily as needed for anxiety. (Patient taking differently: Take 0.5 mg by mouth 2 (two) times daily.), Disp: 30 tablet, Rfl: 0   amiodarone (PACERONE) 200 MG tablet, Take 1 tablet (200 mg total) by mouth daily for 7 days, THEN 1 tablet (200 mg total) daily. (Patient taking differently: 200 mg daily), Disp:  90 tablet, Rfl: 3   apixaban (ELIQUIS) 5 MG TABS tablet, Take 1 tablet (5 mg total) by mouth 2 (two) times daily., Disp: 180 tablet, Rfl: 3   blood glucose meter kit and supplies KIT, Dispense based on patient and insurance preference. Use up to four times daily as directed., Disp: 1 each, Rfl: 0   blood glucose meter kit and supplies, Dispense based on patient and insurance preference. Use up to four times daily as directed. (FOR ICD-10 E10.9, E11.9)., Disp: 1 each, Rfl: 0   Blood Glucose Monitoring Suppl (ACCU-CHEK GUIDE ME) w/Device KIT, Use as instructed to check blood sugar three times daily. E11.69, Disp: 1 kit, Rfl: 0   busPIRone (BUSPAR) 5 MG tablet, Take 1 tablet (5 mg total) by mouth 2 (two) times daily., Disp: 60 tablet, Rfl: 1   colchicine 0.6 MG tablet, Taking 1/2 tablet by mouth daily, Disp: , Rfl:    docusate sodium (COLACE) 100 MG capsule, Take 100 mg by mouth 2 (two) times daily., Disp: , Rfl:    Dulaglutide (TRULICITY) 1.5 WJ/1.9JY SOPN, Inject 1.5 mg into the skin once a week. (Patient taking differently: Inject 1.5 mg into the skin once a week. Wednesday's), Disp: 0.5 mL, Rfl: 3   DULoxetine (CYMBALTA) 60 MG capsule, Take 1 capsule (60 mg total) by mouth daily., Disp: 90 capsule, Rfl: 3   EPINEPHrine 0.3 mg/0.3 mL IJ SOAJ injection, Inject 0.3 mg into the muscle as needed for anaphylaxis., Disp: 1 each, Rfl: 1   fluticasone (FLONASE) 50 MCG/ACT nasal spray, Place 2 sprays into both nostrils daily. (Patient taking differently: Place 2 sprays into both nostrils daily as needed for allergies.), Disp: 16 g, Rfl: 6   glucose blood (ACCU-CHEK GUIDE) test strip, Use to check blood sugar TID. E11.69, Disp: 100 each, Rfl: 3   insulin aspart (NOVOLOG) 100 UNIT/ML injection, Inject 12 Units into the skin 3 (three) times daily before meals. For blood sugars 0-199 give 0 units of insulin, 201-250 give 4 units, 251-300 give 6 units, 301-350 give 8 units, 351-400 give 10 units,> 400 give 12 units  and call M.D. (Patient taking differently: Inject 2-10 Units into the skin 3 (three) times daily before meals. Per sliding scale), Disp: 10 mL, Rfl: 3   loratadine (CLARITIN) 10 MG tablet, Take 1 tablet (10 mg total) by mouth daily., Disp: 30 tablet, Rfl: 11   metolazone (ZAROXOLYN) 2.5 MG tablet, Take 1 tablet (2.5 mg total) by mouth 2 (two) times a week. Take every Monday and Friday, Disp: 90 tablet, Rfl: 3   metoprolol succinate (TOPROL XL) 25 MG 24 hr tablet, Take 1.5 tablets (37.5 mg total) by mouth daily., Disp: 30 tablet, Rfl: 11   Multiple Vitamins-Minerals (MULTIVITAMIN WOMEN PO), Take 1 tablet by mouth daily., Disp: , Rfl:    nitroGLYCERIN (NITROSTAT) 0.4 MG SL tablet, Place 1 tablet (0.4 mg total) under the tongue every 5 (five) minutes x 3 doses as needed for chest pain., Disp: 25 tablet, Rfl: 12   pantoprazole (PROTONIX) 40 MG tablet, TAKE 1 TABLET BY MOUTH TWICE A DAY (Patient taking differently:  Take 40 mg by mouth daily.), Disp: 180 tablet, Rfl: 1   potassium chloride SA (KLOR-CON) 20 MEQ tablet, Take 4 tablets (80 mEq total) by mouth daily. Take an additional 40 meq with every dose of METOLAZONE (Patient taking differently: Take 40 mEq by mouth 2 (two) times daily. Take an additional 20 meq with every dose of METOLAZONE), Disp: 66 tablet, Rfl: 6   RESTASIS 0.05 % ophthalmic emulsion, Place 1 drop into both eyes 2 (two) times daily., Disp: , Rfl: 12   rosuvastatin (CRESTOR) 10 MG tablet, Take 1 tablet (10 mg total) by mouth daily. (Patient taking differently: Take 10 mg by mouth every evening.), Disp: 90 tablet, Rfl: 3   torsemide (DEMADEX) 100 MG tablet, Take 100 mg by mouth daily., Disp: , Rfl:  Allergies  Allergen Reactions   Bee Pollen Anaphylaxis   Sulfa Antibiotics Itching     Social History   Socioeconomic History   Marital status: Significant Other    Spouse name: Not on file   Number of children: 2   Years of education: Not on file   Highest education level: Some  college, no degree  Occupational History   Not on file  Tobacco Use   Smoking status: Never   Smokeless tobacco: Never  Vaping Use   Vaping Use: Never used  Substance and Sexual Activity   Alcohol use: No   Drug use: No   Sexual activity: Not Currently  Other Topics Concern   Not on file  Social History Narrative   Pt lives in [place] with spouse.  2 grown children.   Previously worked in [employer] at [employer].  Now on disability   Social Determinants of Health   Financial Resource Strain: Low Risk    Difficulty of Paying Living Expenses: Not very hard  Food Insecurity: No Food Insecurity   Worried About Charity fundraiser in the Last Year: Never true   Ran Out of Food in the Last Year: Never true  Transportation Needs: No Transportation Needs   Lack of Transportation (Medical): No   Lack of Transportation (Non-Medical): No  Physical Activity: Not on file  Stress: Not on file  Social Connections: Not on file  Intimate Partner Violence: Not on file    Physical Exam Vitals reviewed.  Constitutional:      Appearance: Normal appearance. She is obese.  HENT:     Head: Normocephalic.     Nose: Nose normal.     Mouth/Throat:     Mouth: Mucous membranes are moist.     Pharynx: Oropharynx is clear.  Eyes:     Conjunctiva/sclera: Conjunctivae normal.     Pupils: Pupils are equal, round, and reactive to light.  Cardiovascular:     Rate and Rhythm: Normal rate and regular rhythm.     Pulses: Normal pulses.     Heart sounds: Normal heart sounds.  Pulmonary:     Effort: Pulmonary effort is normal.     Breath sounds: Normal breath sounds.  Abdominal:     General: Abdomen is flat.     Palpations: Abdomen is soft.  Musculoskeletal:        General: Swelling present. Normal range of motion.     Cervical back: Normal range of motion.     Right lower leg: No edema.     Left lower leg: No edema.  Skin:    General: Skin is warm and dry.     Capillary Refill: Capillary  refill takes less than  2 seconds.  Neurological:     General: No focal deficit present.     Mental Status: She is alert. Mental status is at baseline.  Psychiatric:        Mood and Affect: Mood normal.        Future Appointments  Date Time Provider [place]  08/24/2021  9:50 AM [name], NP [facility] None  08/24/2021  2:00 PM [facility] PA/NP [facility] None  10/03/2021  9:00 AM Buford Dresser, MD [facility]     ACTION: Home visit completed

## 2021-08-12 ENCOUNTER — Other Ambulatory Visit (HOSPITAL_COMMUNITY): Payer: Self-pay | Admitting: Internal Medicine

## 2021-08-18 ENCOUNTER — Other Ambulatory Visit (HOSPITAL_COMMUNITY): Payer: Self-pay

## 2021-08-18 NOTE — Progress Notes (Signed)
Paramedicine Encounter    Patient ID: [name], female    DOB: [birth year], 65 y.o.   MRN: [id number]  Arrived for home visit for Netty who reports feeling okay but having some burning under her left breast moving across to the mid axillary area of her left underarm. She reports it feeling hot to touch and swollen. I visualized same and it did not appear swollen but was feverish to the touch. No redness or injury noted. I advised her to monitor same and report to her PCP at next weeks visit.   Vitals and assessment as noted.  Weight did not change- 281lbs CBG after a meal- 184  BP- 122/68 HR-68 regular  Lungs-clear  No swelling or edema noted.   Meds were reviewed and confirmed. Pill box was filled by patient and was checked for accuracy. It was correct.   Appointments confirmed.   Home visit complete.  No refills needed.       Patient Care Team: [name], NP as PCP - General (Internal Medicine) Buford Dresser, MD as PCP - Cardiology (Cardiology) Vickie Epley, MD as PCP - Electrophysiology (Cardiology) Charolette Forward, MD as Referring Physician (Internal Medicine)  Patient Active Problem List   Diagnosis Date Noted   COPD (chronic obstructive pulmonary disease) ([place]) 07/29/2021   Prolonged QT interval 92/42/6834   Acute systolic CHF (congestive heart failure) ([place]) 07/28/2021   Biventricular heart failure ([place])    Acute kidney injury ([place]) 05/02/2021   Acute on chronic systolic heart failure ([place]) 02/25/2021   Atrial flutter ([place])    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   Encounter for preoperative dental examination    Teeth missing    Gingivitis    Accretions on teeth    CHF exacerbation ([place]) 12/21/2020   Acute on chronic heart failure ([place]) 12/19/2020   Atrial fibrillation, permanent ([place]) 11/02/2020   Chronic combined systolic and diastolic heart failure ([place]) 05/06/2020   NICM  (nonischemic cardiomyopathy) ([place]) 04/01/2020   Family history of heart disease 04/01/2020   Mitral regurgitation 04/01/2020   Severe tricuspid regurgitation 04/01/2020   Closed right ankle fracture 09/24/2019   DKA, type 2 ([place]) 09/24/2019   Halfway (hyperglycemic hyperosmolar nonketotic coma) ([place]) 02/21/2019   Hypokalemia 02/21/2019   Acute kidney injury superimposed on CKD ([place]) 02/21/2019   Acute on chronic left systolic heart failure ([place]) 07/01/2018   Congestive heart failure with left ventricular diastolic dysfunction, acute ([place]) 07/01/2018   Right upper lobe pneumonia 05/14/2018   Atrial fibrillation with RVR ([place]) 05/14/2018   Chronic atrial fibrillation ([place]) 19/62/2297   Diastolic dysfunction 98/92/1194   Diabetes mellitus type 2 in obese ([place]) 12/26/2017   Nausea and vomiting    Chest pain 02/26/2012   OSA (obstructive sleep apnea) 09/21/2011   Hypoxia 09/21/2011   Diabetes mellitus ([place]) 02/04/2010   Morbid obesity ([place]) 02/04/2010   AF (paroxysmal atrial fibrillation) ([place]) 02/04/2010   Gastroparesis 02/04/2010   Acute gastroenteritis 02/04/2010    Current Outpatient Medications:    acetaminophen (TYLENOL) 500 MG tablet, Take 1,000 mg by mouth every 6 (six) hours as needed for mild pain., Disp: , Rfl:    albuterol (VENTOLIN HFA) 108 (90 Base) MCG/ACT inhaler, Inhale 2 puffs into the lungs every 4 (four) hours as needed for wheezing or shortness of breath (Asthma)., Disp: , Rfl:    Alcohol Swabs (B-D SINGLE USE SWABS REGULAR) PADS, 1 each by Other route daily., Disp: 100 each, Rfl:  11   ALPRAZolam (XANAX) 0.5 MG tablet, Take 1 tablet (0.5 mg total) by mouth 2 (two) times daily as needed for anxiety. (Patient taking differently: Take 0.5 mg by mouth 2 (two) times daily.), Disp: 30 tablet, Rfl: 0   amiodarone (PACERONE) 200 MG tablet, Take 1 tablet (200 mg total) by mouth daily for 7 days, THEN 1 tablet (200 mg total) daily. (Patient taking differently: 200 mg daily), Disp:  90 tablet, Rfl: 3   apixaban (ELIQUIS) 5 MG TABS tablet, Take 1 tablet (5 mg total) by mouth 2 (two) times daily., Disp: 180 tablet, Rfl: 3   blood glucose meter kit and supplies KIT, Dispense based on patient and insurance preference. Use up to four times daily as directed., Disp: 1 each, Rfl: 0   blood glucose meter kit and supplies, Dispense based on patient and insurance preference. Use up to four times daily as directed. (FOR ICD-10 E10.9, E11.9)., Disp: 1 each, Rfl: 0   Blood Glucose Monitoring Suppl (ACCU-CHEK GUIDE ME) w/Device KIT, Use as instructed to check blood sugar three times daily. E11.69, Disp: 1 kit, Rfl: 0   busPIRone (BUSPAR) 5 MG tablet, Take 1 tablet (5 mg total) by mouth 2 (two) times daily., Disp: 60 tablet, Rfl: 1   colchicine 0.6 MG tablet, Taking 1/2 tablet by mouth daily, Disp: , Rfl:    docusate sodium (COLACE) 100 MG capsule, Take 100 mg by mouth 2 (two) times daily., Disp: , Rfl:    Dulaglutide (TRULICITY) 1.5 HY/8.6VH SOPN, Inject 1.5 mg into the skin once a week. (Patient taking differently: Inject 1.5 mg into the skin once a week. Wednesday's), Disp: 0.5 mL, Rfl: 3   DULoxetine (CYMBALTA) 60 MG capsule, Take 1 capsule (60 mg total) by mouth daily., Disp: 90 capsule, Rfl: 3   EPINEPHrine 0.3 mg/0.3 mL IJ SOAJ injection, Inject 0.3 mg into the muscle as needed for anaphylaxis., Disp: 1 each, Rfl: 1   fluticasone (FLONASE) 50 MCG/ACT nasal spray, Place 2 sprays into both nostrils daily. (Patient taking differently: Place 2 sprays into both nostrils daily as needed for allergies.), Disp: 16 g, Rfl: 6   glucose blood (ACCU-CHEK GUIDE) test strip, Use to check blood sugar TID. E11.69, Disp: 100 each, Rfl: 3   insulin aspart (NOVOLOG) 100 UNIT/ML injection, Inject 12 Units into the skin 3 (three) times daily before meals. For blood sugars 0-199 give 0 units of insulin, 201-250 give 4 units, 251-300 give 6 units, 301-350 give 8 units, 351-400 give 10 units,> 400 give 12 units  and call M.D. (Patient taking differently: Inject 2-10 Units into the skin 3 (three) times daily before meals. Per sliding scale), Disp: 10 mL, Rfl: 3   loratadine (CLARITIN) 10 MG tablet, Take 1 tablet (10 mg total) by mouth daily., Disp: 30 tablet, Rfl: 11   metolazone (ZAROXOLYN) 2.5 MG tablet, Take 1 tablet (2.5 mg total) by mouth 2 (two) times a week. Take every Monday and Friday, Disp: 90 tablet, Rfl: 3   metoprolol succinate (TOPROL XL) 25 MG 24 hr tablet, Take 1.5 tablets (37.5 mg total) by mouth daily., Disp: 30 tablet, Rfl: 11   Multiple Vitamins-Minerals (MULTIVITAMIN WOMEN PO), Take 1 tablet by mouth daily., Disp: , Rfl:    nitroGLYCERIN (NITROSTAT) 0.4 MG SL tablet, Place 1 tablet (0.4 mg total) under the tongue every 5 (five) minutes x 3 doses as needed for chest pain., Disp: 25 tablet, Rfl: 12   pantoprazole (PROTONIX) 40 MG tablet, TAKE 1 TABLET BY  MOUTH TWICE A DAY (Patient taking differently: Take 40 mg by mouth daily.), Disp: 180 tablet, Rfl: 1   potassium chloride SA (KLOR-CON) 20 MEQ tablet, Take 4 tablets (80 mEq total) by mouth daily. Take an additional 40 meq with every dose of METOLAZONE (Patient taking differently: Take 40 mEq by mouth 2 (two) times daily. Take an additional 20 meq with every dose of METOLAZONE), Disp: 66 tablet, Rfl: 6   RESTASIS 0.05 % ophthalmic emulsion, Place 1 drop into both eyes 2 (two) times daily., Disp: , Rfl: 12   rosuvastatin (CRESTOR) 10 MG tablet, Take 1 tablet (10 mg total) by mouth daily. (Patient taking differently: Take 10 mg by mouth every evening.), Disp: 90 tablet, Rfl: 3   torsemide (DEMADEX) 100 MG tablet, Take 100 mg by mouth daily., Disp: , Rfl:  Allergies  Allergen Reactions   Bee Pollen Anaphylaxis   Sulfa Antibiotics Itching     Social History   Socioeconomic History   Marital status: Significant Other    Spouse name: Not on file   Number of children: 2   Years of education: Not on file   Highest education level: Some  college, no degree  Occupational History   Not on file  Tobacco Use   Smoking status: Never   Smokeless tobacco: Never  Vaping Use   Vaping Use: Never used  Substance and Sexual Activity   Alcohol use: No   Drug use: No   Sexual activity: Not Currently  Other Topics Concern   Not on file  Social History Narrative   Pt lives in [place] with spouse.  2 grown children.   Previously worked in [employer] at [employer].  Now on disability   Social Determinants of Health   Financial Resource Strain: Low Risk    Difficulty of Paying Living Expenses: Not very hard  Food Insecurity: No Food Insecurity   Worried About Charity fundraiser in the Last Year: Never true   Ran Out of Food in the Last Year: Never true  Transportation Needs: No Transportation Needs   Lack of Transportation (Medical): No   Lack of Transportation (Non-Medical): No  Physical Activity: Not on file  Stress: Not on file  Social Connections: Not on file  Intimate Partner Violence: Not on file    Physical Exam Vitals reviewed.  Constitutional:      Appearance: Normal appearance. She is obese.  HENT:     Head: Normocephalic.     Nose: Nose normal.     Mouth/Throat:     Mouth: Mucous membranes are moist.     Pharynx: Oropharynx is clear.  Eyes:     Conjunctiva/sclera: Conjunctivae normal.     Pupils: Pupils are equal, round, and reactive to light.  Cardiovascular:     Rate and Rhythm: Normal rate and regular rhythm.     Pulses: Normal pulses.     Heart sounds: Normal heart sounds.  Pulmonary:     Effort: Pulmonary effort is normal. No respiratory distress.     Breath sounds: Normal breath sounds. No stridor. No wheezing, rhonchi or rales.  Abdominal:     General: Abdomen is flat.     Palpations: Abdomen is soft.  Musculoskeletal:        General: No swelling. Normal range of motion.     Cervical back: Normal range of motion.     Right lower leg: No edema.     Left lower leg: No edema.  Skin:  General: Skin is warm and dry.     Capillary Refill: Capillary refill takes less than 2 seconds.  Neurological:     General: No focal deficit present.     Mental Status: She is alert. Mental status is at baseline.  Psychiatric:        Mood and Affect: Mood normal.        Future Appointments  Date Time Provider [place]  08/23/2021  8:45 AM Hulda Marin, OT [facility]  08/24/2021  9:50 AM [name], NP [facility] None  08/24/2021  2:00 PM [facility] PA/NP [facility] None  10/03/2021  9:00 AM Buford Dresser, MD [facility]     ACTION: Home visit completed

## 2021-08-19 ENCOUNTER — Other Ambulatory Visit (INDEPENDENT_AMBULATORY_CARE_PROVIDER_SITE_OTHER): Payer: Self-pay | Admitting: Primary Care

## 2021-08-19 DIAGNOSIS — Z760 Encounter for issue of repeat prescription: Secondary | ICD-10-CM

## 2021-08-19 NOTE — Telephone Encounter (Signed)
Requested medication (s) are due for refill today: yes  Requested medication (s) are on the active medication list: yes  Last refill:  07/12/21  Future visit scheduled: 08/24/21  Notes to clinic:  Pt has had a curtesy refill, does have appt 08/24/21, please assess.  Requested Prescriptions  Pending Prescriptions Disp Refills   TRULICITY 1.5 GL/8.7FI SOPN [Pharmacy Med Name: TRULICITY 1.5 EP/3.2 ML PEN]  3    Sig: Inject 1.5 mg into the skin once a week.     Endocrinology:  Diabetes - GLP-1 Receptor Agonists Passed - 08/19/2021  1:24 AM      Passed - HBA1C is between 0 and 7.9 and within 180 days    HbA1c, POC (controlled diabetic range)  Date Value Ref Range Status  09/24/2020 9.9 (A) 0.0 - 7.0 % Final   Hgb A1c MFr Bld  Date Value Ref Range Status  07/29/2021 6.2 (H) 4.8 - 5.6 % Final    Comment:    (NOTE) Pre diabetes:          5.7%-6.4%  Diabetes:              >6.4%  Glycemic control for   <7.0% adults with diabetes           Passed - Valid encounter within last 6 months    Recent Outpatient Visits           3 weeks ago Chest pain due to myocardial ischemia, unspecified ischemic chest pain type   [facility] [name], NP   2 months ago Gastroesophageal reflux disease without esophagitis   [facility] Juluis Mire P, NP   3 months ago Erroneous encounter - disregard   [place], Michelle P, NP   5 months ago Type 2 diabetes mellitus with stage 3 chronic kidney disease, with long-term current use of insulin, unspecified whether stage 3a or 3b CKD ([place])   Moore, Esperanza P, NP   6 months ago Type 2 diabetes mellitus with stage 3 chronic kidney disease, with long-term current use of insulin, unspecified whether stage 3a or 3b CKD ([place])   [place], [place], NP       Future Appointments             In  5 days Oletta Lamas, Milford Cage, NP [place]   In 1 month Buford Dresser, MD [facility], [facility]

## 2021-08-21 ENCOUNTER — Other Ambulatory Visit (INDEPENDENT_AMBULATORY_CARE_PROVIDER_SITE_OTHER): Payer: Self-pay | Admitting: Primary Care

## 2021-08-21 DIAGNOSIS — F4323 Adjustment disorder with mixed anxiety and depressed mood: Secondary | ICD-10-CM

## 2021-08-23 ENCOUNTER — Telehealth (HOSPITAL_COMMUNITY): Payer: Self-pay

## 2021-08-23 ENCOUNTER — Ambulatory Visit: Payer: 59 | Attending: Occupational Therapy | Admitting: Occupational Therapy

## 2021-08-23 NOTE — Telephone Encounter (Signed)
Called to confirm/remind patient of their appointment at the [facility] on 08/24/21.   Patient reminded to bring all medications and/or complete list.  Confirmed patient has transportation. Gave directions, instructed to utilize [place] parking.  Confirmed appointment prior to ending call.

## 2021-08-24 ENCOUNTER — Ambulatory Visit (INDEPENDENT_AMBULATORY_CARE_PROVIDER_SITE_OTHER): Payer: Medicaid Other | Admitting: Primary Care

## 2021-08-24 ENCOUNTER — Telehealth (HOSPITAL_COMMUNITY): Payer: Self-pay

## 2021-08-24 ENCOUNTER — Encounter (HOSPITAL_COMMUNITY): Payer: Medicare Other

## 2021-08-24 NOTE — Telephone Encounter (Signed)
I spoke to [name] at 11:20 and she told me she didn't make it to todays PCP appointment due to her having cold/flu symptoms and unable to leave home. I advised her the cancellation procedure for the clinic and she reports she is going to seek care at an UC for follow up for her symptoms. She asked me to cancel her HF clinic appointment for today as well. She requests a paramedicine visit next week. Call complete and I will continue to follow up.

## 2021-08-25 ENCOUNTER — Ambulatory Visit: Payer: 59 | Admitting: Physician Assistant

## 2021-08-25 ENCOUNTER — Other Ambulatory Visit: Payer: Self-pay

## 2021-08-25 ENCOUNTER — Telehealth (HOSPITAL_COMMUNITY): Payer: Self-pay

## 2021-08-25 DIAGNOSIS — J0100 Acute maxillary sinusitis, unspecified: Secondary | ICD-10-CM

## 2021-08-25 MED ORDER — AMOXICILLIN-POT CLAVULANATE 875-125 MG PO TABS: 1.0000 | ORAL_TABLET | Freq: Two times a day (BID) | ORAL | 0 refills | Status: DC

## 2021-08-25 MED ORDER — BENZONATATE 200 MG PO CAPS: 200.0000 mg | ORAL_CAPSULE | Freq: Two times a day (BID) | ORAL | 0 refills | Status: DC | PRN

## 2021-08-25 NOTE — Telephone Encounter (Signed)
The patient was referred to the [facility] Unit for follow up

## 2021-08-25 NOTE — Telephone Encounter (Signed)
Kaoru reached out to me stating she feels like she has fluid in her face, when she described this it sounds more like sinus congestion. She denied leg swelling, weight gain or increased shortness of breath. She continued to state she has congestion, sinus pressure, headache and fever with body aches. She has not taken anything over the counter. I reccommended heart healthy over the counter cold/flu medications and I advised her I could attempt to reach [facility] for a possible virtual visit with a provider or the mobile care unit schedule.  I will work with Lowell Guitar RN on same. I will follow up with patient once this is complete.

## 2021-08-25 NOTE — Progress Notes (Signed)
Established Patient Office Visit  Subjective:  Patient ID: [name], female    DOB: 1956-06-17  Age: 66 y.o. MRN: [id number]  CC:  Chief Complaint  Patient presents with   Sinusitis   Virtual Visit via Telephone Note  I connected with [name] on 08/25/21 at  4:40 PM EST by telephone and verified that I am speaking with the correct person using two identifiers.  Location: Patient: Home Provider: [facility]    I discussed the limitations, risks, security and privacy concerns of performing an evaluation and management service by telephone and the availability of in person appointments. I also discussed with the patient that there may be a patient responsible charge related to this service. The patient expressed understanding and agreed to proceed.   History of Present Illness: States that she  has been having sinus congestion, sinus pressure and pain, unmeasured fever, difficulty sleeping to productive cough with yellow phlegm since Monday, Jan 2, 23.  Is eating and drinking okay but does endorse low appetite.  States that she has had a few episodes of loose stools as well.  No sick contacts.    Has taken Tylenol with little relief.    Observations/Objective: Medical history and current medications reviewed, no physical exam completed  Past Medical History:  Diagnosis Date   Allergic rhinitis    Arthritis    Asthma    Chest pain 12/27/2017   Chronic diastolic CHF (congestive heart failure) (HCC)    COPD (chronic obstructive pulmonary disease) (HCC)    Depression    DM (diabetes mellitus) ([place])    DVT (deep vein thrombosis) in pregnancy    Dysrhythmia    atrial fibrilation   GERD (gastroesophageal reflux disease)    Headache(784.0)    HTN (hypertension)    Hyperlipidemia    Obesity    Pneumonia 04/2018   RIGHT LOBE   Shortness of breath    Sleep apnea    compliant with CPAP    Past Surgical History:  Procedure  Laterality Date   ABDOMINAL HYSTERECTOMY     BUBBLE STUDY  11/26/2020   Procedure: BUBBLE STUDY;  Surgeon: Buford Dresser, MD;  Location: [place];  Service: Cardiovascular;;   CARDIOVERSION N/A 05/06/2020   Procedure: CARDIOVERSION;  Surgeon: Buford Dresser, MD;  Location: [facility] ENDOSCOPY;  Service: Cardiovascular;  Laterality: N/A;   CARDIOVERSION N/A 11/04/2020   Procedure: CARDIOVERSION;  Surgeon: Lelon Perla, MD;  Location: [place];  Service: Cardiovascular;  Laterality: N/A;   CARDIOVERSION N/A 02/01/2021   Procedure: CARDIOVERSION;  Surgeon: Jolaine Artist, MD;  Location: [place];  Service: Cardiovascular;  Laterality: N/A;   CARDIOVERSION N/A 02/09/2021   Procedure: CARDIOVERSION;  Surgeon: Jolaine Artist, MD;  Location: [place];  Service: Cardiovascular;  Laterality: N/A;   CARDIOVERSION N/A 04/19/2021   Procedure: CARDIOVERSION;  Surgeon: Fay Records, MD;  Location: [place];  Service: Cardiovascular;  Laterality: N/A;   CATARACT EXTRACTION, BILATERAL     CHOLECYSTECTOMY     COLONOSCOPY WITH PROPOFOL N/A 03/05/2015   Procedure: COLONOSCOPY WITH PROPOFOL;  Surgeon: Carol Ada, MD;  Location: [facility] ENDOSCOPY;  Service: Endoscopy;  Laterality: N/A;   DIAGNOSTIC LAPAROSCOPY     ingrown hallux Left    KNEE SURGERY     LEFT HEART CATH AND CORONARY ANGIOGRAPHY N/A 12/31/2017   Procedure: LEFT HEART CATH AND CORONARY ANGIOGRAPHY;  Surgeon: Charolette Forward, MD;  Location: [place] CV LAB;  Service: Cardiovascular;  Laterality: N/A;  MAZE N/A 12/29/2020   Procedure: MAZE;  Surgeon: [name], MD;  Location: [place];  Service: Open Heart Surgery;  Laterality: N/A;   MITRAL VALVE REPAIR N/A 12/29/2020   Procedure: MITRAL VALVE REPAIR USING CARBOMEDICS ANNULOFLEX RING SIZE 30;  Surgeon: [name], MD;  Location: [place];  Service: Open Heart Surgery;  Laterality: N/A;   RIGHT HEART CATH N/A 12/22/2020   Procedure: RIGHT HEART  CATH;  Surgeon: Larey Dresser, MD;  Location: [place] CV [facility];  Service: Cardiovascular;  Laterality: N/A;   RIGHT HEART CATH N/A 01/31/2021   Procedure: RIGHT HEART CATH;  Surgeon: Jolaine Artist, MD;  Location: [place] [facility];  Service: Cardiovascular;  Laterality: N/A;   RIGHT HEART CATH N/A 07/29/2021   Procedure: RIGHT HEART CATH;  Surgeon: Jolaine Artist, MD;  Location: [place] [facility];  Service: Cardiovascular;  Laterality: N/A;   TEE WITHOUT CARDIOVERSION N/A 11/26/2020   Procedure: TRANSESOPHAGEAL ECHOCARDIOGRAM (TEE);  Surgeon: Buford Dresser, MD;  Location: [facility] ENDOSCOPY;  Service: Cardiovascular;  Laterality: N/A;   TEE WITHOUT CARDIOVERSION N/A 12/29/2020   Procedure: TRANSESOPHAGEAL ECHOCARDIOGRAM (TEE);  Surgeon: [name], MD;  Location: [place];  Service: Open Heart Surgery;  Laterality: N/A;   TRICUSPID VALVE REPLACEMENT N/A 12/29/2020   Procedure: 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 RING SIZE 34;  Surgeon: [name], MD;  Location: [place];  Service: Open Heart Surgery;  Laterality: N/A;    Family History  Problem Relation Age of Onset   Cancer Mother    Hypertension Mother    Cancer Father    CAD Other    Hypertension Sister    Hypertension Brother     Social History   Socioeconomic History   Marital status: Significant Other    Spouse name: Not on file   Number of children: 2   Years of education: Not on file   Highest education level: Some college, no degree  Occupational History   Not on file  Tobacco Use   Smoking status: Never   Smokeless tobacco: Never  Vaping Use   Vaping Use: Never used  Substance and Sexual Activity   Alcohol use: No   Drug use: No   Sexual activity: Not Currently  Other Topics Concern   Not on file  Social History Narrative   Pt lives in [place] with spouse.  2 grown children.   Previously worked in [employer] at [employer].  Now on disability   Social Determinants of  Health   Financial Resource Strain: Low Risk    Difficulty of Paying Living Expenses: Not very hard  Food Insecurity: No Food Insecurity   Worried About Charity fundraiser in the Last Year: Never true   Ran Out of Food in the Last Year: Never true  Transportation Needs: No Transportation Needs   Lack of Transportation (Medical): No   Lack of Transportation (Non-Medical): No  Physical Activity: Not on file  Stress: Not on file  Social Connections: Not on file  Intimate Partner Violence: Not on file    Outpatient Medications Prior to Visit  Medication Sig Dispense Refill   acetaminophen (TYLENOL) 500 MG tablet Take 1,000 mg by mouth every 6 (six) hours as needed for mild pain.     albuterol (VENTOLIN HFA) 108 (90 Base) MCG/ACT inhaler Inhale 2 puffs into the lungs every 4 (four) hours as needed for wheezing or shortness of breath (Asthma).     Alcohol  Swabs (B-D SINGLE USE SWABS REGULAR) PADS 1 each by Other route daily. 100 each 11   ALPRAZolam (XANAX) 0.5 MG tablet Take 1 tablet (0.5 mg total) by mouth 2 (two) times daily as needed for anxiety. (Patient taking differently: Take 0.5 mg by mouth 2 (two) times daily.) 30 tablet 0   amiodarone (PACERONE) 200 MG tablet Take 1 tablet (200 mg total) by mouth daily for 7 days, THEN 1 tablet (200 mg total) daily. (Patient taking differently: 200 mg daily) 90 tablet 3   apixaban (ELIQUIS) 5 MG TABS tablet Take 1 tablet (5 mg total) by mouth 2 (two) times daily. 180 tablet 3   blood glucose meter kit and supplies KIT Dispense based on patient and insurance preference. Use up to four times daily as directed. 1 each 0   blood glucose meter kit and supplies Dispense based on patient and insurance preference. Use up to four times daily as directed. (FOR ICD-10 E10.9, E11.9). 1 each 0   Blood Glucose Monitoring Suppl (ACCU-CHEK GUIDE ME) w/Device KIT Use as instructed to check blood sugar three times daily. E11.69 1 kit 0   busPIRone (BUSPAR) 5 MG tablet  TAKE 1 TABLET BY MOUTH TWICE A DAY 180 tablet 1   colchicine 0.6 MG tablet Taking 1/2 tablet by mouth daily     docusate sodium (COLACE) 100 MG capsule Take 100 mg by mouth 2 (two) times daily.     DULoxetine (CYMBALTA) 60 MG capsule Take 1 capsule (60 mg total) by mouth daily. 90 capsule 3   EPINEPHrine 0.3 mg/0.3 mL IJ SOAJ injection Inject 0.3 mg into the muscle as needed for anaphylaxis. 1 each 1   fluticasone (FLONASE) 50 MCG/ACT nasal spray Place 2 sprays into both nostrils daily. (Patient taking differently: Place 2 sprays into both nostrils daily as needed for allergies.) 16 g 6   glucose blood (ACCU-CHEK GUIDE) test strip Use to check blood sugar TID. E11.69 100 each 3   insulin aspart (NOVOLOG) 100 UNIT/ML injection Inject 12 Units into the skin 3 (three) times daily before meals. For blood sugars 0-199 give 0 units of insulin, 201-250 give 4 units, 251-300 give 6 units, 301-350 give 8 units, 351-400 give 10 units,> 400 give 12 units and call M.D. (Patient taking differently: Inject 2-10 Units into the skin 3 (three) times daily before meals. Per sliding scale) 10 mL 3   loratadine (CLARITIN) 10 MG tablet Take 1 tablet (10 mg total) by mouth daily. 30 tablet 11   metolazone (ZAROXOLYN) 2.5 MG tablet Take 1 tablet (2.5 mg total) by mouth 2 (two) times a week. Take every Monday and Friday 90 tablet 3   metoprolol succinate (TOPROL XL) 25 MG 24 hr tablet Take 1.5 tablets (37.5 mg total) by mouth daily. 30 tablet 11   Multiple Vitamins-Minerals (MULTIVITAMIN WOMEN PO) Take 1 tablet by mouth daily.     nitroGLYCERIN (NITROSTAT) 0.4 MG SL tablet Place 1 tablet (0.4 mg total) under the tongue every 5 (five) minutes x 3 doses as needed for chest pain. 25 tablet 12   pantoprazole (PROTONIX) 40 MG tablet TAKE 1 TABLET BY MOUTH TWICE A DAY (Patient taking differently: Take 40 mg by mouth daily.) 180 tablet 1   potassium chloride SA (KLOR-CON) 20 MEQ tablet Take 4 tablets (80 mEq total) by mouth daily.  Take an additional 40 meq with every dose of METOLAZONE (Patient taking differently: Take 40 mEq by mouth 2 (two) times daily. Take an additional 20 meq  with every dose of METOLAZONE) 66 tablet 6   RESTASIS 0.05 % ophthalmic emulsion Place 1 drop into both eyes 2 (two) times daily.  12   rosuvastatin (CRESTOR) 10 MG tablet Take 1 tablet (10 mg total) by mouth daily. (Patient taking differently: Take 10 mg by mouth every evening.) 90 tablet 3   torsemide (DEMADEX) 100 MG tablet Take 100 mg by mouth daily.     TRULICITY 1.5 PP/5.0DT SOPN INJECT 1.5 MG INTO THE SKIN ONCE A WEEK. 2 mL 2   No facility-administered medications prior to visit.    Allergies  Allergen Reactions   Bee Pollen Anaphylaxis   Sulfa Antibiotics Itching    ROS Review of Systems  Constitutional:  Positive for fatigue. Negative for chills and fever.  HENT:  Positive for congestion, sinus pressure and sinus pain. Negative for sore throat and trouble swallowing.   Eyes: Negative.   Respiratory:  Positive for cough. Negative for shortness of breath and wheezing.   Cardiovascular:  Negative for chest pain.  Gastrointestinal:  Positive for diarrhea. Negative for abdominal pain, nausea and vomiting.  Endocrine: Negative.   Genitourinary: Negative.   Musculoskeletal: Negative.   Skin: Negative.   Allergic/Immunologic: Negative.   Neurological:  Positive for headaches.  Hematological: Negative.   Psychiatric/Behavioral: Negative.       Objective:      There were no vitals taken for this visit. Wt Readings from Last 3 Encounters:  08/18/21 281 lb (127.5 kg)  08/11/21 281 lb (127.5 kg)  07/31/21 275 lb 2.2 oz (124.8 kg)     Health Maintenance Due  Topic Date Due   OPHTHALMOLOGY EXAM  Never done   Zoster Vaccines- Shingrix (1 of 2) Never done   PAP SMEAR-Modifier  Never done   MAMMOGRAM  Never done   Pneumonia Vaccine 35+ Years old (2 - PCV) 02/26/2013   COVID-19 Vaccine (3 - Pfizer risk series) 01/01/2020    URINE MICROALBUMIN  02/23/2021   DEXA SCAN  Never done    There are no preventive care reminders to display for this patient.  Lab Results  Component Value Date   TSH 2.666 07/13/2021   Lab Results  Component Value Date   WBC 5.4 07/30/2021   HGB 12.0 07/30/2021   HCT 37.4 07/30/2021   MCV 88.4 07/30/2021   PLT 195 07/30/2021   Lab Results  Component Value Date   NA 135 07/31/2021   K 3.6 07/31/2021   CO2 31 07/31/2021   GLUCOSE 147 (H) 07/31/2021   BUN 64 (H) 07/31/2021   CREATININE 3.69 (H) 07/31/2021   BILITOT 0.7 07/13/2021   ALKPHOS 172 (H) 07/13/2021   AST 33 07/13/2021   ALT 32 07/13/2021   PROT 8.5 (H) 07/13/2021   ALBUMIN 3.7 07/13/2021   CALCIUM 9.0 07/31/2021   ANIONGAP 8 07/31/2021   EGFR 26 (L) 04/14/2021   Lab Results  Component Value Date   CHOL 158 05/03/2021   Lab Results  Component Value Date   HDL 50 05/03/2021   Lab Results  Component Value Date   LDLCALC 91 05/03/2021   Lab Results  Component Value Date   TRIG 83 05/03/2021   Lab Results  Component Value Date   CHOLHDL 3.2 05/03/2021   Lab Results  Component Value Date   HGBA1C 6.2 (H) 07/29/2021      Assessment & Plan:   Problem List Items Addressed This Visit   None Visit Diagnoses     Acute non-recurrent maxillary sinusitis    -  Primary   Relevant Medications   amoxicillin-clavulanate (AUGMENTIN) 875-125 MG tablet   benzonatate (TESSALON) 200 MG capsule       Meds ordered this encounter  Medications   amoxicillin-clavulanate (AUGMENTIN) 875-125 MG tablet    Sig: Take 1 tablet by mouth 2 (two) times daily.    Dispense:  20 tablet    Refill:  0    Order Specific Question:   Supervising Provider    Answer:   Elsie Stain [[id number]]   benzonatate (TESSALON) 200 MG capsule    Sig: Take 1 capsule (200 mg total) by mouth 2 (two) times daily as needed for cough.    Dispense:  20 capsule    Refill:  0    Order Specific Question:   Supervising Provider    Answer:    Elsie Stain [[id number]]     Assessment and Plan: 1. Acute non-recurrent maxillary sinusitis Trial augmentin, tessalon perles, patient education given on supportive care, red flags for prompt reevaluation. - amoxicillin-clavulanate (AUGMENTIN) 875-125 MG tablet; Take 1 tablet by mouth 2 (two) times daily.  Dispense: 20 tablet; Refill: 0 - benzonatate (TESSALON) 200 MG capsule; Take 1 capsule (200 mg total) by mouth 2 (two) times daily as needed for cough.  Dispense: 20 capsule; Refill: 0   Follow Up Instructions:    I discussed the assessment and treatment plan with the patient. The patient was provided an opportunity to ask questions and all were answered. The patient agreed with the plan and demonstrated an understanding of the instructions.   The patient was advised to call back or seek an in-person evaluation if the symptoms worsen or if the condition fails to improve as anticipated.  I provided 21 minutes of non-face-to-face time during this encounter.  Follow-up: Return if symptoms worsen or fail to improve.   No AVS was created, patient declines my chart. Loraine Grip Mayers, PA-C

## 2021-08-25 NOTE — Progress Notes (Signed)
Patient has taken medication and has low appetite at this time. Patient reports Sx starting Monday. Patient has diarrhea with last episode today. Patient reports body aches and night sweats with productive yellow sputum cough. Patient has only taken tylenol with no exposure that she is aware of.

## 2021-08-26 ENCOUNTER — Inpatient Hospital Stay (HOSPITAL_COMMUNITY)
Admission: EM | Admit: 2021-08-26 | Discharge: 2021-09-04 | DRG: 291 | Disposition: A | Discharge status: Home / Self Care | Payer: 59 | Attending: Internal Medicine | Admitting: Internal Medicine

## 2021-08-26 ENCOUNTER — Encounter (HOSPITAL_COMMUNITY): Payer: Self-pay | Admitting: Emergency Medicine

## 2021-08-26 ENCOUNTER — Emergency Department (HOSPITAL_COMMUNITY): Payer: 59

## 2021-08-26 DIAGNOSIS — R55 Syncope and collapse: Secondary | ICD-10-CM | POA: Diagnosis not present

## 2021-08-26 DIAGNOSIS — E876 Hypokalemia: Secondary | ICD-10-CM | POA: Diagnosis present

## 2021-08-26 DIAGNOSIS — M1990 Unspecified osteoarthritis, unspecified site: Secondary | ICD-10-CM | POA: Diagnosis present

## 2021-08-26 DIAGNOSIS — E1169 Type 2 diabetes mellitus with other specified complication: Secondary | ICD-10-CM | POA: Diagnosis present

## 2021-08-26 DIAGNOSIS — Z90710 Acquired absence of both cervix and uterus: Secondary | ICD-10-CM

## 2021-08-26 DIAGNOSIS — R197 Diarrhea, unspecified: Secondary | ICD-10-CM | POA: Diagnosis present

## 2021-08-26 DIAGNOSIS — I10 Essential (primary) hypertension: Secondary | ICD-10-CM | POA: Diagnosis present

## 2021-08-26 DIAGNOSIS — Z5320 Procedure and treatment not carried out because of patient's decision for unspecified reasons: Secondary | ICD-10-CM | POA: Diagnosis not present

## 2021-08-26 DIAGNOSIS — E785 Hyperlipidemia, unspecified: Secondary | ICD-10-CM | POA: Diagnosis present

## 2021-08-26 DIAGNOSIS — N179 Acute kidney failure, unspecified: Secondary | ICD-10-CM | POA: Diagnosis not present

## 2021-08-26 DIAGNOSIS — E669 Obesity, unspecified: Secondary | ICD-10-CM | POA: Diagnosis present

## 2021-08-26 DIAGNOSIS — Z760 Encounter for issue of repeat prescription: Secondary | ICD-10-CM

## 2021-08-26 DIAGNOSIS — J0100 Acute maxillary sinusitis, unspecified: Secondary | ICD-10-CM

## 2021-08-26 DIAGNOSIS — Z8249 Family history of ischemic heart disease and other diseases of the circulatory system: Secondary | ICD-10-CM

## 2021-08-26 DIAGNOSIS — I5043 Acute on chronic combined systolic (congestive) and diastolic (congestive) heart failure: Secondary | ICD-10-CM

## 2021-08-26 DIAGNOSIS — J9601 Acute respiratory failure with hypoxia: Secondary | ICD-10-CM | POA: Diagnosis present

## 2021-08-26 DIAGNOSIS — T501X5A Adverse effect of loop [high-ceiling] diuretics, initial encounter: Secondary | ICD-10-CM | POA: Diagnosis present

## 2021-08-26 DIAGNOSIS — I484 Atypical atrial flutter: Secondary | ICD-10-CM

## 2021-08-26 DIAGNOSIS — Z6841 Body Mass Index (BMI) 40.0 and over, adult: Secondary | ICD-10-CM

## 2021-08-26 DIAGNOSIS — I50813 Acute on chronic right heart failure: Secondary | ICD-10-CM | POA: Diagnosis present

## 2021-08-26 DIAGNOSIS — I5023 Acute on chronic systolic (congestive) heart failure: Secondary | ICD-10-CM

## 2021-08-26 DIAGNOSIS — Z794 Long term (current) use of insulin: Secondary | ICD-10-CM

## 2021-08-26 DIAGNOSIS — G4733 Obstructive sleep apnea (adult) (pediatric): Secondary | ICD-10-CM | POA: Diagnosis present

## 2021-08-26 DIAGNOSIS — I480 Paroxysmal atrial fibrillation: Secondary | ICD-10-CM | POA: Diagnosis present

## 2021-08-26 DIAGNOSIS — I493 Ventricular premature depolarization: Secondary | ICD-10-CM | POA: Diagnosis not present

## 2021-08-26 DIAGNOSIS — F419 Anxiety disorder, unspecified: Secondary | ICD-10-CM | POA: Diagnosis present

## 2021-08-26 DIAGNOSIS — R112 Nausea with vomiting, unspecified: Secondary | ICD-10-CM | POA: Diagnosis present

## 2021-08-26 DIAGNOSIS — K219 Gastro-esophageal reflux disease without esophagitis: Secondary | ICD-10-CM | POA: Diagnosis present

## 2021-08-26 DIAGNOSIS — E1122 Type 2 diabetes mellitus with diabetic chronic kidney disease: Secondary | ICD-10-CM | POA: Diagnosis present

## 2021-08-26 DIAGNOSIS — U071 COVID-19: Secondary | ICD-10-CM | POA: Diagnosis present

## 2021-08-26 DIAGNOSIS — I4892 Unspecified atrial flutter: Secondary | ICD-10-CM | POA: Diagnosis not present

## 2021-08-26 DIAGNOSIS — Z952 Presence of prosthetic heart valve: Secondary | ICD-10-CM

## 2021-08-26 DIAGNOSIS — N184 Chronic kidney disease, stage 4 (severe): Secondary | ICD-10-CM | POA: Diagnosis present

## 2021-08-26 DIAGNOSIS — R0602 Shortness of breath: Secondary | ICD-10-CM

## 2021-08-26 DIAGNOSIS — R7989 Other specified abnormal findings of blood chemistry: Secondary | ICD-10-CM | POA: Diagnosis not present

## 2021-08-26 DIAGNOSIS — I130 Hypertensive heart and chronic kidney disease with heart failure and stage 1 through stage 4 chronic kidney disease, or unspecified chronic kidney disease: Principal | ICD-10-CM | POA: Diagnosis present

## 2021-08-26 DIAGNOSIS — Z86718 Personal history of other venous thrombosis and embolism: Secondary | ICD-10-CM

## 2021-08-26 DIAGNOSIS — I071 Rheumatic tricuspid insufficiency: Secondary | ICD-10-CM | POA: Diagnosis present

## 2021-08-26 DIAGNOSIS — Z28311 Partially vaccinated for covid-19: Secondary | ICD-10-CM

## 2021-08-26 DIAGNOSIS — J449 Chronic obstructive pulmonary disease, unspecified: Secondary | ICD-10-CM | POA: Diagnosis present

## 2021-08-26 DIAGNOSIS — F32A Depression, unspecified: Secondary | ICD-10-CM | POA: Diagnosis present

## 2021-08-26 DIAGNOSIS — Z7901 Long term (current) use of anticoagulants: Secondary | ICD-10-CM

## 2021-08-26 DIAGNOSIS — R0600 Dyspnea, unspecified: Secondary | ICD-10-CM

## 2021-08-26 DIAGNOSIS — Z91199 Patient's noncompliance with other medical treatment and regimen due to unspecified reason: Secondary | ICD-10-CM

## 2021-08-26 LAB — COMPREHENSIVE METABOLIC PANEL
ALT: 41 U/L (ref 0–44)
AST: 41 U/L (ref 15–41)
Albumin: 3.2 g/dL — ABNORMAL LOW (ref 3.5–5.0)
Alkaline Phosphatase: 175 U/L — ABNORMAL HIGH (ref 38–126)
Anion gap: 9 (ref 5–15)
BUN: 26 mg/dL — ABNORMAL HIGH (ref 8–23)
CO2: 27 mmol/L (ref 22–32)
Calcium: 9 mg/dL (ref 8.9–10.3)
Chloride: 102 mmol/L (ref 98–111)
Creatinine, Ser: 2.61 mg/dL — ABNORMAL HIGH (ref 0.44–1.00)
GFR, Estimated: 20 mL/min — ABNORMAL LOW (ref >60)
Glucose, Bld: 162 mg/dL — ABNORMAL HIGH (ref 70–99)
Potassium: 3.9 mmol/L (ref 3.5–5.1)
Sodium: 138 mmol/L (ref 135–145)
Total Bilirubin: 0.5 mg/dL (ref 0.3–1.2)
Total Protein: 7.8 g/dL (ref 6.5–8.1)

## 2021-08-26 LAB — CBC
HCT: 38.1 % (ref 36.0–46.0)
Hemoglobin: 12.1 g/dL (ref 12.0–15.0)
MCH: 28.5 pg (ref 26.0–34.0)
MCHC: 31.8 g/dL (ref 30.0–36.0)
MCV: 89.9 fL (ref 80.0–100.0)
Platelets: 159 10*3/uL (ref 150–400)
RBC: 4.24 MIL/uL (ref 3.87–5.11)
RDW: 14.4 % (ref 11.5–15.5)
WBC: 4.5 10*3/uL (ref 4.0–10.5)
nRBC: 0 % (ref 0.0–0.2)

## 2021-08-26 LAB — TROPONIN I (HIGH SENSITIVITY)
Troponin I (High Sensitivity): 10 ng/L (ref <18)
Troponin I (High Sensitivity): 10 ng/L (ref <18)

## 2021-08-26 LAB — RESP PANEL BY RT-PCR (FLU A&B, COVID) ARPGX2
Influenza A by PCR: NEGATIVE
Influenza B by PCR: NEGATIVE
SARS Coronavirus 2 by RT PCR: POSITIVE — AB

## 2021-08-26 LAB — LIPASE, BLOOD: Lipase: 25 U/L (ref 11–51)

## 2021-08-26 LAB — BRAIN NATRIURETIC PEPTIDE: B Natriuretic Peptide: 170.2 pg/mL — ABNORMAL HIGH (ref 0.0–100.0)

## 2021-08-26 LAB — MAGNESIUM: Magnesium: 2.1 mg/dL (ref 1.7–2.4)

## 2021-08-26 MED ORDER — DILTIAZEM HCL-DEXTROSE 125-5 MG/125ML-% IV SOLN (PREMIX)
5.0000 mg/h | INTRAVENOUS | Status: DC
  Administered 2021-08-26: 5 mg/h via INTRAVENOUS
  Filled 2021-08-26: qty 125

## 2021-08-26 MED ORDER — BUSPIRONE HCL 10 MG PO TABS
5.0000 mg | ORAL_TABLET | Freq: Two times a day (BID) | ORAL | Status: DC
  Administered 2021-08-26 – 2021-09-04 (×18): 5 mg via ORAL
  Filled 2021-08-26 (×19): qty 1

## 2021-08-26 MED ORDER — METOPROLOL SUCCINATE ER 25 MG PO TB24
37.5000 mg | ORAL_TABLET | Freq: Every day | ORAL | Status: DC
  Administered 2021-08-27 – 2021-09-04 (×9): 37.5 mg via ORAL
  Filled 2021-08-26 (×9): qty 2

## 2021-08-26 MED ORDER — NITROGLYCERIN 0.4 MG SL SUBL
0.4000 mg | SUBLINGUAL_TABLET | SUBLINGUAL | Status: DC | PRN
  Administered 2021-09-02: 0.4 mg via SUBLINGUAL
  Filled 2021-08-26: qty 1

## 2021-08-26 MED ORDER — DULOXETINE HCL 60 MG PO CPEP
60.0000 mg | ORAL_CAPSULE | Freq: Every day | ORAL | Status: DC
  Administered 2021-08-26 – 2021-09-04 (×10): 60 mg via ORAL
  Filled 2021-08-26 (×10): qty 1

## 2021-08-26 MED ORDER — POTASSIUM CHLORIDE CRYS ER 20 MEQ PO TBCR
40.0000 meq | EXTENDED_RELEASE_TABLET | Freq: Two times a day (BID) | ORAL | Status: DC
  Administered 2021-08-26: 40 meq via ORAL
  Filled 2021-08-26: qty 2

## 2021-08-26 MED ORDER — SODIUM CHLORIDE 0.9% FLUSH
3.0000 mL | Freq: Two times a day (BID) | INTRAVENOUS | Status: DC
  Administered 2021-08-26 – 2021-09-04 (×16): 3 mL via INTRAVENOUS

## 2021-08-26 MED ORDER — ALPRAZOLAM 0.5 MG PO TABS
0.5000 mg | ORAL_TABLET | Freq: Two times a day (BID) | ORAL | Status: DC | PRN
  Administered 2021-08-26 – 2021-08-29 (×4): 0.5 mg via ORAL
  Filled 2021-08-26: qty 1
  Filled 2021-08-26: qty 2
  Filled 2021-08-26 (×2): qty 1

## 2021-08-26 MED ORDER — ALBUTEROL SULFATE (2.5 MG/3ML) 0.083% IN NEBU: 2.5000 mg | INHALATION_SOLUTION | RESPIRATORY_TRACT | Status: DC | PRN

## 2021-08-26 MED ORDER — ACETAMINOPHEN 325 MG PO TABS
650.0000 mg | ORAL_TABLET | Freq: Four times a day (QID) | ORAL | Status: DC | PRN
  Administered 2021-08-28 – 2021-09-02 (×11): 650 mg via ORAL
  Filled 2021-08-26 (×11): qty 2

## 2021-08-26 MED ORDER — SODIUM CHLORIDE 0.9% FLUSH: 3.0000 mL | INTRAVENOUS | Status: DC | PRN

## 2021-08-26 MED ORDER — LORATADINE 10 MG PO TABS
10.0000 mg | ORAL_TABLET | Freq: Every day | ORAL | Status: DC
  Administered 2021-08-26 – 2021-09-04 (×10): 10 mg via ORAL
  Filled 2021-08-26 (×10): qty 1

## 2021-08-26 MED ORDER — FUROSEMIDE 10 MG/ML IJ SOLN
80.0000 mg | Freq: Two times a day (BID) | INTRAMUSCULAR | Status: DC
  Administered 2021-08-26 – 2021-08-29 (×6): 80 mg via INTRAVENOUS
  Filled 2021-08-26 (×6): qty 8

## 2021-08-26 MED ORDER — SODIUM CHLORIDE 0.9 % IV SOLN
250.0000 mL | INTRAVENOUS | Status: DC | PRN
  Administered 2021-08-27: 250 mL via INTRAVENOUS

## 2021-08-26 MED ORDER — COLCHICINE 0.3 MG HALF TABLET
0.3000 mg | ORAL_TABLET | Freq: Every day | ORAL | Status: DC
  Administered 2021-08-26 – 2021-09-04 (×10): 0.3 mg via ORAL
  Filled 2021-08-26 (×10): qty 1

## 2021-08-26 MED ORDER — ACETAMINOPHEN 650 MG RE SUPP: 650.0000 mg | Freq: Four times a day (QID) | RECTAL | Status: DC | PRN

## 2021-08-26 MED ORDER — SODIUM CHLORIDE 0.9 % IV SOLN
Freq: Once | INTRAVENOUS | Status: AC
Start: 2021-08-26 — End: 2021-08-26

## 2021-08-26 MED ORDER — FLUTICASONE PROPIONATE 50 MCG/ACT NA SUSP
2.0000 | Freq: Every day | NASAL | Status: DC
  Administered 2021-08-26 – 2021-09-04 (×10): 2 via NASAL
  Filled 2021-08-26: qty 16

## 2021-08-26 MED ORDER — PANTOPRAZOLE SODIUM 40 MG PO TBEC
40.0000 mg | DELAYED_RELEASE_TABLET | Freq: Every day | ORAL | Status: DC
Start: 2021-08-26 — End: 2021-09-04
  Administered 2021-08-26 – 2021-09-04 (×10): 40 mg via ORAL
  Filled 2021-08-26 (×10): qty 1

## 2021-08-26 MED ORDER — DOCUSATE SODIUM 100 MG PO CAPS
100.0000 mg | ORAL_CAPSULE | Freq: Two times a day (BID) | ORAL | Status: DC
  Administered 2021-08-26 – 2021-09-04 (×18): 100 mg via ORAL
  Filled 2021-08-26 (×18): qty 1

## 2021-08-26 MED ORDER — ROSUVASTATIN CALCIUM 5 MG PO TABS
10.0000 mg | ORAL_TABLET | Freq: Every evening | ORAL | Status: DC
  Administered 2021-08-26 – 2021-09-03 (×9): 10 mg via ORAL
  Filled 2021-08-26 (×10): qty 2

## 2021-08-26 MED ORDER — COLCHICINE 0.6 MG PO TABS
0.3000 mg | ORAL_TABLET | Freq: Every day | ORAL | Status: DC
  Filled 2021-08-26: qty 0.5

## 2021-08-26 MED ORDER — APIXABAN 5 MG PO TABS
5.0000 mg | ORAL_TABLET | Freq: Two times a day (BID) | ORAL | Status: DC
  Administered 2021-08-26 – 2021-09-04 (×18): 5 mg via ORAL
  Filled 2021-08-26 (×18): qty 1

## 2021-08-26 MED ORDER — AMIODARONE HCL IN DEXTROSE 360-4.14 MG/200ML-% IV SOLN
30.0000 mg/h | INTRAVENOUS | Status: DC
  Administered 2021-08-26 – 2021-08-28 (×3): 30 mg/h via INTRAVENOUS
  Filled 2021-08-26 (×5): qty 200

## 2021-08-26 MED ORDER — DILTIAZEM LOAD VIA INFUSION
20.0000 mg | Freq: Once | INTRAVENOUS | Status: DC
Start: 2021-08-26 — End: 2021-08-26

## 2021-08-26 MED ORDER — CYCLOSPORINE 0.05 % OP EMUL
1.0000 [drp] | Freq: Two times a day (BID) | OPHTHALMIC | Status: DC
  Administered 2021-08-26 – 2021-09-04 (×17): 1 [drp] via OPHTHALMIC
  Filled 2021-08-26 (×20): qty 30

## 2021-08-26 MED ORDER — PROCHLORPERAZINE EDISYLATE 10 MG/2ML IJ SOLN: 5.0000 mg | Freq: Four times a day (QID) | INTRAMUSCULAR | Status: DC | PRN

## 2021-08-26 MED ORDER — AMIODARONE LOAD VIA INFUSION
150.0000 mg | Freq: Once | INTRAVENOUS | Status: AC
  Administered 2021-08-26: 150 mg via INTRAVENOUS
  Filled 2021-08-26: qty 83.34

## 2021-08-26 MED ORDER — DILTIAZEM HCL 25 MG/5ML IV SOLN
20.0000 mg | Freq: Once | INTRAVENOUS | Status: AC
  Administered 2021-08-26: 20 mg via INTRAVENOUS
  Filled 2021-08-26: qty 5

## 2021-08-26 MED ORDER — ADULT MULTIVITAMIN W/MINERALS CH
1.0000 | ORAL_TABLET | Freq: Every day | ORAL | Status: DC
  Administered 2021-08-26 – 2021-09-04 (×10): 1 via ORAL
  Filled 2021-08-26 (×10): qty 1

## 2021-08-26 MED ORDER — AMIODARONE HCL IN DEXTROSE 360-4.14 MG/200ML-% IV SOLN
60.0000 mg/h | INTRAVENOUS | Status: AC
  Administered 2021-08-26: 60 mg/h via INTRAVENOUS
  Filled 2021-08-26: qty 200

## 2021-08-26 NOTE — Assessment & Plan Note (Addendum)
-  does not appear significantly volume overloaded, but weight up to 295 and was 275 on discharge a few weeks ago -no LE edema, JVD and CXR without overt fluid load. BnP mildly elevated -She is on 100 mg daily of torsemide at home with twice weekly dosing of 2.5 mg metolazone.holding this -IV diuresis per cardiology at 80mg  BID -strict I/O and daily weights -recent echo: 07/29/21 with EF of 40-45% with mildly reduced LVF. Diastolic parameters indeterminate

## 2021-08-26 NOTE — Assessment & Plan Note (Signed)
Not fully compliant with cpap at home. Will order here nightly

## 2021-08-26 NOTE — ED Triage Notes (Signed)
EMS stated, Tuesday this week felt not well, she has chest pain with N/V , fever and SOB with exertion . Pt feels like she is fluid over loaded.

## 2021-08-26 NOTE — Assessment & Plan Note (Signed)
Well controlled, continue toprol-xl

## 2021-08-26 NOTE — Assessment & Plan Note (Signed)
Stable, continue cymbalta, buspar and xanax prn

## 2021-08-26 NOTE — ED Provider Triage Note (Signed)
Emergency Medicine Provider Triage Evaluation Note  [name] , a 66 y.o. female  was evaluated in triage.  Pt complains of chest pain.  She states that she has had chest pain since Tuesday.  She describes it as stabbing and then as an elephant sitting on her chest.  She states it is mostly left-sided and radiates over to the left shoulder.  She endorses intermittent nausea as well as shortness of breath.  She also endorses palpitations.  Denies lower extremity edema, syncope, diaphoresis.  Of note she had tricuspid valve replacement, mitral valve repair in May of last year.  She has paroxysmal atrial fibrillation with multiple cardioversions.  Review of Systems  Positive: See above Negative:   Physical Exam  BP 133/79 (BP Location: Right Arm)    Pulse (!) 133    Temp 98.9 F (37.2 C) (Oral)    Resp 20    SpO2 99%  Gen:   Awake, mild distress Resp:  Mildly short of breath MSK:   Moves extremities without difficulty  Other:  Tachycardia  Medical Decision Making  Medically screening exam initiated at 12:13 PM.  Appropriate orders placed.  Hoa Briggs was informed that the remainder of the evaluation will be completed by another provider, this initial triage assessment does not replace that evaluation, and the importance of remaining in the ED until their evaluation is complete.     Mickie Hillier, PA-C 08/26/21 1215

## 2021-08-26 NOTE — Assessment & Plan Note (Addendum)
66 year old presenting with 3 day history of shortness of breath, chest pain and palpitations found to be in atrial flutter with RVR in setting of known PAF with MAZE procedure in 12/2020 and subsequent afib.  -missed her dose of eliquis this AM -stopping cardizem drip and start IV amio per cardiology  -continue eliquis and telemetry  -continue her toprol-xl  -TSH recently checked and wnl  -possible ablation and PPM per EP, follow cards recommendation

## 2021-08-26 NOTE — Assessment & Plan Note (Signed)
Continue protonix daily. 

## 2021-08-26 NOTE — H&P (Signed)
History and Physical    [name] QQV:[id number] DOB: 1956/02/24 DOA: 08/26/2021  PCP: [name], NP Consultants:  cardiology: Dr. Harrell Gave: and Dr. Baldwin [place] & CTS: Dr. Roxan Hockey, nephrology: Dr. Posey Pronto  Patient coming from:  Home - lives with daughter and 3 grandkids.   Chief Complaint: chest pain and shortness of breath   HPI: [name] is a 66 y.o. female with medical history significant of PAF on  eliquis with hx of multiple cardioversion's, severe MR/TR,  P3IR, chronic diastolic/systolic CHF, CKD stage IV, severe MR s/p mitral valve replacement in 12/2020, OSA, COPD who presented to ED with chest pain and shortness of breath x 3 days. She states the chest pain was over the left chest wall and felt like an elephant on her chest and radiated toward her arm. Pain rated as a 9/10. Nothing made it better or worse and has been constant. She had associated shortness of breath and nausea. She also has a cough that is productive in nature. She also endorses worsening orthopnea and mild leg swelling. She also felt like she went into atrial fibrillation 3 days at this time as well. She last took her eliquis yesterday evening.   No fever/chills at home, she has had no dizziness, no stomach pain, V/D, dysuria. No rashes. No sick contacts.   Recently admitted on 07/28/21 for chest pain and ACS was ruled out. Underwent RHC which showed normal filling pressure with mild PAH and moderately reduced cardiac output likely due in part to low fluid status. Weight at discharge was 275 pounds.   ED Course: vitals: afebrile, bp: 133/79, HR: 133, RR: 20, oxygen: 99% RA Pertinent labs: bun: 26, creatinine: 2.61 , bnp: 170,  CXR: cardiomegaly, no overt edema or effusion. In ED: given   Review of Systems: As per HPI; otherwise review of systems reviewed and negative.   Ambulatory Status:  Ambulates with cane    Past Medical History:  Diagnosis Date   Allergic rhinitis     Arthritis    Asthma    Chest pain 12/27/2017   Chronic diastolic CHF (congestive heart failure) (HCC)    COPD (chronic obstructive pulmonary disease) (HCC)    Depression    DM (diabetes mellitus) ([place])    DVT (deep vein thrombosis) in pregnancy    Dysrhythmia    atrial fibrilation   GERD (gastroesophageal reflux disease)    Headache(784.0)    HTN (hypertension)    Hyperlipidemia    Obesity    Pneumonia 04/2018   RIGHT LOBE   Shortness of breath    Sleep apnea    compliant with CPAP    Past Surgical History:  Procedure Laterality Date   ABDOMINAL HYSTERECTOMY     BUBBLE STUDY  11/26/2020   Procedure: BUBBLE STUDY;  Surgeon: Buford Dresser, MD;  Location: [place];  Service: Cardiovascular;;   CARDIOVERSION N/A 05/06/2020   Procedure: CARDIOVERSION;  Surgeon: Buford Dresser, MD;  Location: [facility] ENDOSCOPY;  Service: Cardiovascular;  Laterality: N/A;   CARDIOVERSION N/A 11/04/2020   Procedure: CARDIOVERSION;  Surgeon: Lelon Perla, MD;  Location: [place];  Service: Cardiovascular;  Laterality: N/A;   CARDIOVERSION N/A 02/01/2021   Procedure: CARDIOVERSION;  Surgeon: Jolaine Artist, MD;  Location: [facility] ENDOSCOPY;  Service: Cardiovascular;  Laterality: N/A;   CARDIOVERSION N/A 02/09/2021   Procedure: CARDIOVERSION;  Surgeon: Jolaine Artist, MD;  Location: [facility] ENDOSCOPY;  Service: Cardiovascular;  Laterality: N/A;   CARDIOVERSION N/A 04/19/2021   Procedure: CARDIOVERSION;  Surgeon: Dorris Carnes  V, MD;  Location: [place];  Service: Cardiovascular;  Laterality: N/A;   CATARACT EXTRACTION, BILATERAL     CHOLECYSTECTOMY     COLONOSCOPY WITH PROPOFOL N/A 03/05/2015   Procedure: COLONOSCOPY WITH PROPOFOL;  Surgeon: Carol Ada, MD;  Location: [facility] ENDOSCOPY;  Service: Endoscopy;  Laterality: N/A;   DIAGNOSTIC LAPAROSCOPY     ingrown hallux Left    KNEE SURGERY     LEFT HEART CATH AND CORONARY ANGIOGRAPHY N/A 12/31/2017   Procedure: LEFT HEART CATH AND  CORONARY ANGIOGRAPHY;  Surgeon: Charolette Forward, MD;  Location: [place] CV LAB;  Service: Cardiovascular;  Laterality: N/A;   MAZE N/A 12/29/2020   Procedure: MAZE;  Surgeon: [name], MD;  Location: [place];  Service: Open Heart Surgery;  Laterality: N/A;   MITRAL VALVE REPAIR N/A 12/29/2020   Procedure: MITRAL VALVE REPAIR USING CARBOMEDICS ANNULOFLEX RING SIZE 30;  Surgeon: [name], MD;  Location: [place];  Service: Open Heart Surgery;  Laterality: N/A;   RIGHT HEART CATH N/A 12/22/2020   Procedure: RIGHT HEART CATH;  Surgeon: Larey Dresser, MD;  Location: [place] CV LAB;  Service: Cardiovascular;  Laterality: N/A;   RIGHT HEART CATH N/A 01/31/2021   Procedure: RIGHT HEART CATH;  Surgeon: Jolaine Artist, MD;  Location: [place] CV LAB;  Service: Cardiovascular;  Laterality: N/A;   RIGHT HEART CATH N/A 07/29/2021   Procedure: RIGHT HEART CATH;  Surgeon: Jolaine Artist, MD;  Location: [place] CV LAB;  Service: Cardiovascular;  Laterality: N/A;   TEE WITHOUT CARDIOVERSION N/A 11/26/2020   Procedure: TRANSESOPHAGEAL ECHOCARDIOGRAM (TEE);  Surgeon: Buford Dresser, MD;  Location: [facility] ENDOSCOPY;  Service: Cardiovascular;  Laterality: N/A;   TEE WITHOUT CARDIOVERSION N/A 12/29/2020   Procedure: TRANSESOPHAGEAL ECHOCARDIOGRAM (TEE);  Surgeon: [name], MD;  Location: [place];  Service: Open Heart Surgery;  Laterality: N/A;   TRICUSPID VALVE REPLACEMENT N/A 12/29/2020   Procedure: 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 RING SIZE 34;  Surgeon: [name], MD;  Location: [place];  Service: Open Heart Surgery;  Laterality: N/A;    Social History   Socioeconomic History   Marital status: Significant Other    Spouse name: Not on file   Number of children: 2   Years of education: Not on file   Highest education level: Some college, no degree  Occupational History   Not on file  Tobacco Use   Smoking status: Never    Smokeless tobacco: Never  Vaping Use   Vaping Use: Never used  Substance and Sexual Activity   Alcohol use: No   Drug use: No   Sexual activity: Not Currently  Other Topics Concern   Not on file  Social History Narrative   Pt lives in [place] with spouse.  2 grown children.   Previously worked in [employer] at [employer].  Now on disability   Social Determinants of Health   Financial Resource Strain: Low Risk    Difficulty of Paying Living Expenses: Not very hard  Food Insecurity: No Food Insecurity   Worried About Charity fundraiser in the Last Year: Never true   Ran Out of Food in the Last Year: Never true  Transportation Needs: No Transportation Needs   Lack of Transportation (Medical): No   Lack of Transportation (Non-Medical): No  Physical Activity: Not on file  Stress: Not on file  Social Connections: Not on file  Intimate Partner Violence: Not on file  Allergies  Allergen Reactions   Bee Pollen Anaphylaxis   Sulfa Antibiotics Itching    Family History  Problem Relation Age of Onset   Cancer Mother    Hypertension Mother    Cancer Father    CAD Other    Hypertension Sister    Hypertension Brother     Prior to Admission medications   Medication Sig Start Date End Date Taking? Authorizing Provider  acetaminophen (TYLENOL) 500 MG tablet Take 1,000 mg by mouth every 6 (six) hours as needed for mild pain.   Yes [provider]  albuterol (VENTOLIN HFA) 108 (90 Base) MCG/ACT inhaler Inhale 2 puffs into the lungs every 4 (four) hours as needed for wheezing or shortness of breath (Asthma). 08/15/19  Yes [provider]  ALPRAZolam Prudy Feeler) 0.5 MG tablet Take 1 tablet (0.5 mg total) by mouth 2 (two) times daily as needed for anxiety. 02/15/21  Yes Grayce Sessions, NP  amiodarone (PACERONE) 200 MG tablet Take 1 tablet (200 mg total) by mouth daily for 7 days, THEN 1 tablet (200 mg total) daily. Patient taking differently: 200 mg daily 07/27/21  08/03/22 Yes Lanier Prude, MD  apixaban (ELIQUIS) 5 MG TABS tablet Take 1 tablet (5 mg total) by mouth 2 (two) times daily. 05/16/21  Yes Milford, Anderson Malta, FNP  busPIRone (BUSPAR) 5 MG tablet TAKE 1 TABLET BY MOUTH TWICE A DAY Patient taking differently: Take 5 mg by mouth 2 (two) times daily. 08/22/21  Yes Grayce Sessions, NP  colchicine 0.6 MG tablet Take 0.3 mg by mouth daily.   Yes [provider]  docusate sodium (COLACE) 100 MG capsule Take 100 mg by mouth 2 (two) times daily.   Yes [provider]  DULoxetine (CYMBALTA) 60 MG capsule Take 1 capsule (60 mg total) by mouth daily. 06/14/21  Yes Grayce Sessions, NP  EPINEPHrine 0.3 mg/0.3 mL IJ SOAJ injection Inject 0.3 mg into the muscle as needed for anaphylaxis. 06/25/20  Yes Grayce Sessions, NP  fluticasone (FLONASE) 50 MCG/ACT nasal spray Place 2 sprays into both nostrils daily. 05/24/21  Yes Grayce Sessions, NP  insulin aspart (NOVOLOG) 100 UNIT/ML injection Inject 12 Units into the skin 3 (three) times daily before meals. For blood sugars 0-199 give 0 units of insulin, 201-250 give 4 units, 251-300 give 6 units, 301-350 give 8 units, 351-400 give 10 units,> 400 give 12 units and call M.D. Patient taking differently: Inject 2-10 Units into the skin 3 (three) times daily before meals. Per sliding scale 06/24/20  Yes Grayce Sessions, NP  loratadine (CLARITIN) 10 MG tablet Take 1 tablet (10 mg total) by mouth daily. 05/24/21  Yes Grayce Sessions, NP  metolazone (ZAROXOLYN) 2.5 MG tablet Take 1 tablet (2.5 mg total) by mouth 2 (two) times a week. Take every Monday and Friday 05/16/21 08/26/21 Yes Milford, Anderson Malta, FNP  metoprolol succinate (TOPROL XL) 25 MG 24 hr tablet Take 1.5 tablets (37.5 mg total) by mouth daily. 04/26/21 04/26/22 Yes Newman Nip, NP  Multiple Vitamins-Minerals (MULTIVITAMIN WOMEN PO) Take 1 tablet by mouth daily.   Yes [provider]  nitroGLYCERIN (NITROSTAT) 0.4 MG SL  tablet Place 1 tablet (0.4 mg total) under the tongue every 5 (five) minutes x 3 doses as needed for chest pain. 01/08/14  Yes Rinaldo Cloud, MD  pantoprazole (PROTONIX) 40 MG tablet TAKE 1 TABLET BY MOUTH TWICE A DAY Patient taking differently: Take 40 mg by mouth daily. 07/06/21  Yes [name], NP  potassium chloride SA (KLOR-CON) 20 MEQ tablet Take 4 tablets (80 mEq total) by mouth daily. Take an additional 40 meq with every dose of METOLAZONE Patient taking differently: Take 40 mEq by mouth daily. Take an additional 20 meq with every dose of METOLAZONE 07/13/21  Yes Joette Catching, PA-C  RESTASIS 0.05 % ophthalmic emulsion Place 1 drop into both eyes 2 (two) times daily. 11/19/17  Yes [provider]  rosuvastatin (CRESTOR) 10 MG tablet Take 1 tablet (10 mg total) by mouth daily. Patient taking differently: Take 10 mg by mouth every evening. 05/16/21  Yes Milford, Maricela Bo, FNP  torsemide (DEMADEX) 100 MG tablet Take 100 mg by mouth daily. 07/11/21  Yes [provider]  TRULICITY 1.5 ZS/0.1UX SOPN INJECT 1.5 MG INTO THE SKIN ONCE A WEEK. 08/19/21 11/17/21 Yes [name], NP  Alcohol Swabs (B-D SINGLE USE SWABS REGULAR) PADS 1 each by Other route daily. 07/06/21   [name], NP  amoxicillin-clavulanate (AUGMENTIN) 875-125 MG tablet Take 1 tablet by mouth 2 (two) times daily. Patient not taking: Reported on 08/26/2021 08/25/21   Mayers, Cari S, PA-C  benzonatate (TESSALON) 200 MG capsule Take 1 capsule (200 mg total) by mouth 2 (two) times daily as needed for cough. Patient not taking: Reported on 08/26/2021 08/25/21   Mayers, Cari S, PA-C  blood glucose meter kit and supplies KIT Dispense based on patient and insurance preference. Use up to four times daily as directed. 02/10/21   Patrecia Pour, MD  blood glucose meter kit and supplies Dispense based on patient and insurance preference. Use up to four times daily as directed. (FOR ICD-10 E10.9, E11.9).  07/06/21   [name], NP  Blood Glucose Monitoring Suppl (ACCU-CHEK GUIDE ME) w/Device KIT Use as instructed to check blood sugar three times daily. E11.69 04/21/20   Charlott Rakes, MD  glucose blood (ACCU-CHEK GUIDE) test strip Use to check blood sugar TID. E11.69 07/13/21   Charlott Rakes, MD    Physical Exam: Vitals:   08/26/21 1432 08/26/21 1445 08/26/21 1515 08/26/21 1900  BP:  (!) 128/92 (!) 101/53 (!) 128/98  Pulse:  82 (!) 113 (!) 125  Resp:  (!) 21 (!) 28 (!) 27  Temp:      TempSrc:      SpO2:  96% 99% 100%  Weight: 135.2 kg     Height: $Remove'5\' 7"'XxtNIpa$  (1.702 m)        General:  Appears calm and comfortable and is in NAD Eyes:  PERRL, EOMI, normal lids, iris ENT:  grossly normal hearing, lips & tongue, mmm; appropriate dentition Neck:  no LAD, masses or thyromegaly; no carotid bruits, no JVD Cardiovascular:  RRR, +systolic murmur. No LE edema.  Respiratory:   CTA bilaterally with no wheezes/rales/rhonchi.  Normal respiratory effort. Abdomen:  soft, NT, ND, NABS Back:   normal alignment, no CVAT Skin:  no rash or induration seen on limited exam Musculoskeletal:  grossly normal tone BUE/BLE, good ROM, no bony abnormality Lower extremity:  No LE edema.  Limited foot exam with no ulcerations.  2+ distal pulses. Psychiatric:  grossly normal mood and affect, speech fluent and appropriate, AOx3 Neurologic:  CN 2-12 grossly intact, moves all extremities in coordinated fashion, sensation intact    Radiological Exams on Admission: Independently reviewed - see discussion in A/P where applicable  DG Chest 2 View  Result Date: 08/26/2021 CLINICAL DATA:  cp EXAM: CHEST - 2 VIEW COMPARISON:  December 2022 FINDINGS: Cardiomegaly. Left atrial appendage clip present. Median sternotomy wires. No pleural effusion. No pneumothorax. No overt edema or lobar consolidation. IMPRESSION: Cardiomegaly.  No overt edema or effusion. Electronically Signed   By: Albin Felling M.D.   On: 08/26/2021  13:15    EKG: Independently reviewed.  Atrial flutter with rate 133; nonspecific ST changes with no evidence of acute ischemia   Labs on Admission: I have personally reviewed the available labs and imaging studies at the time of the admission.  Pertinent labs:  bun: 26,  creatinine: 2.61 ,  bnp: 170,   Assessment/Plan * Atrial flutter with rapid ventricular response (HCC)- (present on admission) 66 year old presenting with 3 day history of shortness of breath, chest pain and palpitations found to be in atrial flutter with RVR in setting of known PAF with MAZE procedure in 12/2020 and subsequent afib.  -missed her dose of eliquis this AM -stopping cardizem drip and start IV amio per cardiology  -continue eliquis and telemetry  -continue her toprol-xl  -TSH recently checked and wnl  -possible ablation and PPM per EP, follow cards recommendation   Acute on chronic combined systolic and diastolic CHF (congestive heart failure) ([place])- (present on admission) -does not appear significantly volume overloaded, but weight up to 295 and was 275 on discharge a few weeks ago -no LE edema, JVD and CXR without overt fluid load. BnP mildly elevated -She is on 100 mg daily of torsemide at home with twice weekly dosing of 2.5 mg metolazone.holding this -IV diuresis per cardiology at $RemoveBefor'80mg'[id number]$  BID -strict I/O and daily weights -recent echo: 07/29/21 with EF of 40-45% with mildly reduced LVF. Diastolic parameters indeterminate  CKD (chronic kidney disease) stage 4, GFR 15-29 ml/min (HCC)- (present on admission) Followed outpatient by nephrology Baseline appears to be around 2.2-3.5 Creatinine today is 2.61.  Continue to monitor with IV diuresis   Diabetes mellitus type 2 in obese [facility])- (present on admission) Last a1c was 6.2 in 07/2021 SSI and accuchecks per protocol   Essential hypertension- (present on admission) Well controlled, continue toprol-xl   COPD (chronic obstructive pulmonary disease)  ([place])- (present on admission) No signs of exacerbation at this time Continue prn albuterol   Anxiety and depression- (present on admission) Stable, continue cymbalta, buspar and xanax prn   GERD (gastroesophageal reflux disease)- (present on admission) Continue protonix daily   OSA (obstructive sleep apnea)- (present on admission) Not fully compliant with cpap at home. Will order here nightly   Hyperlipidemia Continue crestor    Body mass index is 46.67 kg/m.    Level of care: Progressive DVT prophylaxis:  eliquis  Code Status:  Full - confirmed with patient Family Communication: None present Disposition Plan:  The patient is from: home  Anticipated d/c is to: home   Patient placed in observation as anticipate less than 2 midnight stay. Requires hospitalization for IV diuresis, IV rate and rhythm controlling drugs with possible intervention for persistent atrial fibrillation/atrial flutter.    Patient is currently: stable Consults called: cardiology: Dr. Harrell Gave   Admission status:  observation   Orma Flaming MD [facility]   How to contact the [facility] Attending or Consulting provider [place] or covering provider during after hours [place], for this patient?  Check the care team in [facility] and look for a) attending/consulting [facility] provider listed and b) the [facility] team listed Log into [email or web address] and use [place]'s universal password to access. If you do not have the password,  please contact the hospital operator. Locate the [facility] provider you are looking for under [facility] and page to a number that you can be directly reached. If you still have difficulty reaching the provider, please page the [facility] (Director on Call) for the Hospitalists listed on amion for assistance.   08/26/2021, 9:37 PM

## 2021-08-26 NOTE — Consult Note (Signed)
Cardiology Consultation:   Patient ID: [name] MRN: [id number]; DOB: 04-30-1956  Admit date: 08/26/2021 Date of Consult: 08/26/2021  PCP:  [name], NP   [facility] Providers Cardiologist:  Buford Dresser, MD  Electrophysiologist:  Vickie Epley, MD       Patient Profile:   [name] is a 66 y.o. 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 Rogers Blocker.  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  She was noted to have borderline low EF of 45 to 50% in 2019.  Cardiac catheterization showed mild nonobstructive CAD.  She has failed the Tikosyn and later switched to amiodarone.  She takes Eliquis for anticoagulation.  Right heart cath performed on 12/22/2020 showed elevated filling pressure with low cardiac index.  She underwent mitral valve and tricuspid valve repair with maze procedure on 12/30/2020.  Postop, she had HTN and volume overload.  Postop echo showed EF 55%, hypokinesis of RV, moderate TR, no significant MR, no effusion.  Hemodynamics improved with milrinone.  She was discharged on 60 mg daily of torsemide.  She was readmitted with acute on chronic heart failure in June 2023.  She underwent diuresis.  Right heart cath showed low filling pressure and moderately reduced cardiac output.  Patient underwent successful cardioversion on 02/01/2021.  Hospital course complicated by acute on chronic renal insufficiency.  Unfortunately she had early return of A. fib and underwent repeat cardioversion again  on 02/09/2021.  She was given a thyroid taper in 3 months on low-dose colchicine for post cardiotomy syndrome.  Due to recurrence of atrial fibrillation, she was readmitted to the hospital under converted on IV amiodarone.  Echocardiogram obtained on 02/25/2021 showed EF 30 to 35% prosthetic annuloplasty ring present in the mitral position, mild MR, moderate to severe mitral stenosis, moderate TR.  She had volume overload in September 2022, metolazone was increased to twice daily dosing.  She was last seen by heart failure service on 06/20/2021 at which time her torsemide was increased to 100 mg daily.  She was seen by EP service on 07/27/2021, at which time he was back in sinus rhythm.  Was recommended to continue amiodarone 200 mg twice a day for 1 week then reduce to 200 mg daily thereafter.  Dr. Quentin Ore also mentioned possibility of AV nodal ablation/pacemaker implantation if any recurrence of A. fib.  More recently, patient was seen again in the hospital in early December due to chest pain, dyspnea and dizziness.  Creatinine was elevated around 3.6 which is higher than her baseline of 2.6-2.8.  Repeat echocardiogram at that time showed EF 40 to 45%, s/p annuloplasty ring, mild TR.  There was no mention of moderate to severe MR on this echocardiogram.  Right heart cath performed by Dr. [name] on 07/29/2021 showed cardiac index 1.9, cardiac output 4.3, normal filling pressure with mild PAH.  Patient was discharged on 07/31/2021.  Her discharge weight was 275 pounds.   Patient presented to the emergency room on 08/26/2021 with complaint of chest pain, nausea vomiting, diarrhea, leg swelling and shortness of breath for the past  3 days.  She has been compliant with her medications at home.  On arrival, heart rate was 298 pounds.  She says she also had a fever all yesterday at 100.5.  However she is afebrile on arrival to the emergency room today.  She complaining of sharp pressure-like sensation in the chest.  EKG  showed rapid atrial flutter.  When asked about her symptom, she says her symptom is similar to what she has felt before with atrial fibrillation.  She usually sleeps upright at night, she did not sleep very well for the past 3 days due to the chest discomfort.  She eventually sought medical attention around noon today.  She did not take any of her medication this morning as she was feeling bad.  Cardiology service is consulted for atrial flutter with RVR.    Past Medical History:  Diagnosis Date   Allergic rhinitis    Arthritis    Asthma    Chest pain 12/27/2017   Chronic diastolic CHF (congestive heart failure) (HCC)    COPD (chronic obstructive pulmonary disease) (HCC)    Depression    DM (diabetes mellitus) ([place])    DVT (deep vein thrombosis) in pregnancy    Dysrhythmia    atrial fibrilation   GERD (gastroesophageal reflux disease)    Headache(784.0)    HTN (hypertension)    Hyperlipidemia    Obesity    Pneumonia 04/2018   RIGHT LOBE   Shortness of breath    Sleep apnea    compliant with CPAP    Past Surgical History:  Procedure Laterality Date   ABDOMINAL HYSTERECTOMY     BUBBLE STUDY  11/26/2020   Procedure: BUBBLE STUDY;  Surgeon: Buford Dresser, MD;  Location: [place];  Service: Cardiovascular;;   CARDIOVERSION N/A 05/06/2020   Procedure: CARDIOVERSION;  Surgeon: Buford Dresser, MD;  Location: [facility];  Service: Cardiovascular;  Laterality: N/A;   CARDIOVERSION N/A 11/04/2020   Procedure: CARDIOVERSION;  Surgeon: Lelon Perla, MD;  Location: [place];  Service: Cardiovascular;  Laterality: N/A;   CARDIOVERSION N/A 02/01/2021   Procedure: CARDIOVERSION;  Surgeon: Jolaine Artist, MD;  Location: [place];  Service: Cardiovascular;  Laterality: N/A;   CARDIOVERSION N/A 02/09/2021   Procedure: CARDIOVERSION;  Surgeon: Jolaine Artist, MD;  Location: [place];  Service: Cardiovascular;  Laterality: N/A;   CARDIOVERSION N/A  04/19/2021   Procedure: CARDIOVERSION;  Surgeon: Fay Records, MD;  Location: [place];  Service: Cardiovascular;  Laterality: N/A;   CATARACT EXTRACTION, BILATERAL     CHOLECYSTECTOMY     COLONOSCOPY WITH PROPOFOL N/A 03/05/2015   Procedure: COLONOSCOPY WITH PROPOFOL;  Surgeon: Carol Ada, MD;  Location: [facility];  Service: Endoscopy;  Laterality: N/A;   DIAGNOSTIC LAPAROSCOPY     ingrown hallux Left    KNEE SURGERY     LEFT HEART CATH AND CORONARY ANGIOGRAPHY N/A 12/31/2017   Procedure: LEFT HEART CATH AND CORONARY ANGIOGRAPHY;  Surgeon: Charolette Forward, MD;  Location: [place] [facility];  Service: Cardiovascular;  Laterality: N/A;   MAZE N/A 12/29/2020   Procedure: MAZE;  Surgeon: [name], MD;  Location: [place];  Service: Open Heart Surgery;  Laterality: N/A;   MITRAL VALVE REPAIR N/A 12/29/2020   Procedure: MITRAL VALVE REPAIR USING CARBOMEDICS ANNULOFLEX RING SIZE 30;  Surgeon: [name], MD;  Location: [place];  Service: Open Heart Surgery;  Laterality: N/A;   RIGHT HEART CATH N/A 12/22/2020   Procedure: RIGHT HEART CATH;  Surgeon: Aundra Dubin,  Elby Showers, MD;  Location: [place] [facility];  Service: Cardiovascular;  Laterality: N/A;   RIGHT HEART CATH N/A 01/31/2021   Procedure: RIGHT HEART CATH;  Surgeon: Jolaine Artist, MD;  Location: [place] [facility];  Service: Cardiovascular;  Laterality: N/A;   RIGHT HEART CATH N/A 07/29/2021   Procedure: RIGHT HEART CATH;  Surgeon: Jolaine Artist, MD;  Location: [place] [facility];  Service: Cardiovascular;  Laterality: N/A;   TEE WITHOUT CARDIOVERSION N/A 11/26/2020   Procedure: TRANSESOPHAGEAL ECHOCARDIOGRAM (TEE);  Surgeon: Buford Dresser, MD;  Location: [facility];  Service: Cardiovascular;  Laterality: N/A;   TEE WITHOUT CARDIOVERSION N/A 12/29/2020   Procedure: TRANSESOPHAGEAL ECHOCARDIOGRAM (TEE);  Surgeon: [name], MD;  Location: [place];  Service: Open Heart Surgery;  Laterality: N/A;    TRICUSPID VALVE REPLACEMENT N/A 12/29/2020   Procedure: 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 RING SIZE 34;  Surgeon: [name], MD;  Location: [place];  Service: Open Heart Surgery;  Laterality: N/A;     Home Medications:  Prior to Admission medications   Medication Sig Start Date End Date Taking? Authorizing Provider  acetaminophen (TYLENOL) 500 MG tablet Take 1,000 mg by mouth every 6 (six) hours as needed for mild pain.   Yes [provider]  albuterol (VENTOLIN HFA) 108 (90 Base) MCG/ACT inhaler Inhale 2 puffs into the lungs every 4 (four) hours as needed for wheezing or shortness of breath (Asthma). 08/15/19  Yes [provider]  ALPRAZolam Duanne Moron) 0.5 MG tablet Take 1 tablet (0.5 mg total) by mouth 2 (two) times daily as needed for anxiety. 02/15/21  Yes [name], NP  amiodarone (PACERONE) 200 MG tablet Take 1 tablet (200 mg total) by mouth daily for 7 days, THEN 1 tablet (200 mg total) daily. Patient taking differently: 200 mg daily 07/27/21 08/03/22 Yes Vickie Epley, MD  apixaban (ELIQUIS) 5 MG TABS tablet Take 1 tablet (5 mg total) by mouth 2 (two) times daily. 05/16/21  Yes Milford, Maricela Bo, FNP  busPIRone (BUSPAR) 5 MG tablet TAKE 1 TABLET BY MOUTH TWICE A DAY Patient taking differently: Take 5 mg by mouth 2 (two) times daily. 08/22/21  Yes [name], NP  colchicine 0.6 MG tablet Take 0.3 mg by mouth daily.   Yes [provider]  docusate sodium (COLACE) 100 MG capsule Take 100 mg by mouth 2 (two) times daily.   Yes [provider]  DULoxetine (CYMBALTA) 60 MG capsule Take 1 capsule (60 mg total) by mouth daily. 06/14/21  Yes [name], NP  EPINEPHrine 0.3 mg/0.3 mL IJ SOAJ injection Inject 0.3 mg into the muscle as needed for anaphylaxis. 06/25/20  Yes [name], NP  fluticasone (FLONASE) 50 MCG/ACT nasal spray Place 2 sprays into both nostrils daily. 05/24/21  Yes [name],  NP  insulin aspart (NOVOLOG) 100 UNIT/ML injection Inject 12 Units into the skin 3 (three) times daily before meals. For blood sugars 0-199 give 0 units of insulin, 201-250 give 4 units, 251-300 give 6 units, 301-350 give 8 units, 351-400 give 10 units,> 400 give 12 units and call M.D. Patient taking differently: Inject 2-10 Units into the skin 3 (three) times daily before meals. Per sliding scale 06/24/20  Yes [name], NP  loratadine (CLARITIN) 10 MG tablet Take 1 tablet (10 mg total) by mouth daily. 05/24/21  Yes [name], NP  metolazone (ZAROXOLYN) 2.5 MG tablet Take 1 tablet (2.5 mg  total) by mouth 2 (two) times a week. Take every Monday and Friday 05/16/21 08/26/21 Yes Milford, Maricela Bo, FNP  metoprolol succinate (TOPROL XL) 25 MG 24 hr tablet Take 1.5 tablets (37.5 mg total) by mouth daily. 04/26/21 04/26/22 Yes Sherran Needs, NP  Multiple Vitamins-Minerals (MULTIVITAMIN WOMEN PO) Take 1 tablet by mouth daily.   Yes [provider]  nitroGLYCERIN (NITROSTAT) 0.4 MG SL tablet Place 1 tablet (0.4 mg total) under the tongue every 5 (five) minutes x 3 doses as needed for chest pain. 01/08/14  Yes Charolette Forward, MD  pantoprazole (PROTONIX) 40 MG tablet TAKE 1 TABLET BY MOUTH TWICE A DAY Patient taking differently: Take 40 mg by mouth daily. 07/06/21  Yes [name], NP  potassium chloride SA (KLOR-CON) 20 MEQ tablet Take 4 tablets (80 mEq total) by mouth daily. Take an additional 40 meq with every dose of METOLAZONE Patient taking differently: Take 40 mEq by mouth daily. Take an additional 20 meq with every dose of METOLAZONE 07/13/21  Yes Joette Catching, PA-C  RESTASIS 0.05 % ophthalmic emulsion Place 1 drop into both eyes 2 (two) times daily. 11/19/17  Yes [provider]  rosuvastatin (CRESTOR) 10 MG tablet Take 1 tablet (10 mg total) by mouth daily. Patient taking differently: Take 10 mg by mouth every evening. 05/16/21  Yes Milford, Maricela Bo, FNP   torsemide (DEMADEX) 100 MG tablet Take 100 mg by mouth daily. 07/11/21  Yes [provider]  TRULICITY 1.5 FF/6.3WG SOPN INJECT 1.5 MG INTO THE SKIN ONCE A WEEK. 08/19/21 11/17/21 Yes [name], NP  Alcohol Swabs (B-D SINGLE USE SWABS REGULAR) PADS 1 each by Other route daily. 07/06/21   [name], NP  amoxicillin-clavulanate (AUGMENTIN) 875-125 MG tablet Take 1 tablet by mouth 2 (two) times daily. Patient not taking: Reported on 08/26/2021 08/25/21   Mayers, Cari S, PA-C  benzonatate (TESSALON) 200 MG capsule Take 1 capsule (200 mg total) by mouth 2 (two) times daily as needed for cough. Patient not taking: Reported on 08/26/2021 08/25/21   Mayers, Cari S, PA-C  blood glucose meter kit and supplies KIT Dispense based on patient and insurance preference. Use up to four times daily as directed. 02/10/21   Patrecia Pour, MD  blood glucose meter kit and supplies Dispense based on patient and insurance preference. Use up to four times daily as directed. (FOR ICD-10 E10.9, E11.9). 07/06/21   [name], NP  Blood Glucose Monitoring Suppl (ACCU-CHEK GUIDE ME) w/Device KIT Use as instructed to check blood sugar three times daily. E11.69 04/21/20   Charlott Rakes, MD  glucose blood (ACCU-CHEK GUIDE) test strip Use to check blood sugar TID. E11.69 07/13/21   Charlott Rakes, MD    Inpatient Medications: Scheduled Meds:  Continuous Infusions:  diltiazem (CARDIZEM) infusion 5 mg/hr (08/26/21 1534)   PRN Meds:   Allergies:    Allergies  Allergen Reactions   Bee Pollen Anaphylaxis   Sulfa Antibiotics Itching    Social History:   Social History   Socioeconomic History   Marital status: Significant Other    Spouse name: Not on file   Number of children: 2   Years of education: Not on file   Highest education level: Some college, no degree  Occupational History   Not on file  Tobacco Use   Smoking status: Never   Smokeless tobacco: Never  Vaping Use   Vaping  Use: Never used  Substance and Sexual Activity  Alcohol use: No   Drug use: No   Sexual activity: Not Currently  Other Topics Concern   Not on file  Social History Narrative   Pt lives in [place] with spouse.  2 grown children.   Previously worked in [employer] at [employer].  Now on disability   Social Determinants of Health   Financial Resource Strain: Low Risk    Difficulty of Paying Living Expenses: Not very hard  Food Insecurity: No Food Insecurity   Worried About Charity fundraiser in the Last Year: Never true   Ran Out of Food in the Last Year: Never true  Transportation Needs: No Transportation Needs   Lack of Transportation (Medical): No   Lack of Transportation (Non-Medical): No  Physical Activity: Not on file  Stress: Not on file  Social Connections: Not on file  Intimate Partner Violence: Not on file    Family History:    Family History  Problem Relation Age of Onset   Cancer Mother    Hypertension Mother    Cancer Father    CAD Other    Hypertension Sister    Hypertension Brother      ROS:  Please see the history of present illness.   All other ROS reviewed and negative.     Physical Exam/Data:   Vitals:   08/26/21 1430 08/26/21 1432 08/26/21 1445 08/26/21 1515  BP: 115/73  (!) 128/92 (!) 101/53  Pulse: (!) 130  82 (!) 113  Resp: (!) 24  (!) 21 (!) 28  Temp:      TempSrc:      SpO2: 98%  96% 99%  Weight:  135.2 kg    Height:  _0  (1.702 m)     No intake or output data in the 24 hours ending 08/26/21 1652 Last 3 Weights 08/26/2021 08/18/2021 08/11/2021  Weight (lbs) 298 lb 281 lb 281 lb  Weight (kg) 135.172 kg 127.461 kg 127.461 kg  Some encounter information is confidential and restricted. Go to Review Flowsheets activity to see all data.     Body mass index is 46.67 kg/m.  General:  Well nourished, well developed, in no acute distress HEENT: normal Neck: no JVD Vascular: No carotid bruits; Distal pulses 2+ bilaterally Cardiac:  normal  S1, S2; tachycardic; no murmur  Lungs:  clear to auscultation bilaterally, no wheezing, rhonchi or rales  Abd: soft, nontender, no hepatomegaly  Ext: no edema Musculoskeletal:  No deformities, BUE and BLE strength normal and equal Skin: warm and dry  Neuro:  CNs 2-12 intact, no focal abnormalities noted Psych:  Normal affect   EKG:  The EKG was personally reviewed and demonstrates: Atrial flutter with RVR Telemetry:  Telemetry was personally reviewed and demonstrates: Atrial flutter with RVR  Relevant CV Studies:  Echo 07/29/2021  1. Left ventricular ejection fraction, by estimation, is 40 to 45%. The  left ventricle has mildly decreased function. The left ventricle  demonstrates global hypokinesis. There is mild left ventricular  hypertrophy. Left ventricular diastolic parameters  are indeterminate.   2. Right ventricular systolic function is mildly reduced. The right  ventricular size is normal. There is normal pulmonary artery systolic  pressure.   3. Left atrial size was moderately dilated.   4. Right atrial size was mildly dilated.   5. HR 90 bpm. MV peak gradient, 14.1 mmHg. The mean mitral valve gradient  is 6.5 mmHg      . There is a 30 mm prosthetic annuloplasty ring present in  the mitral  position.   6. S/p repair with an annuloplasty ring.   7. The aortic valve is normal in structure. Aortic valve regurgitation is  not visualized. No aortic stenosis is present.   8. The inferior vena cava is normal in size with greater than 50%  respiratory variability, suggesting right atrial pressure of 3 mmHg.   Conclusion(s)/Recommendation(s): Compared to prior echo 02/28/2021, PA  pressure reduced.    Cath 07/29/2021 Findings:   RA = 5 RV = 36/8 PA = 40/11 (26) PCW = 13 Fick cardiac output/index = 4.3/1.9 PVR = 3.0 WU FA sat = 95% PA sat = 52%, 55%   Assessment: Normal filling pressures with mild PAH Moderately reduced cardiac out likely due in part to low fluid  status   Plan/Discussion:    Volume status normal to low. Dyspnea likely not related to HF. Would hold diuretics for now and resume when Scr improving. We will follow as outpatient.     Laboratory Data:  High Sensitivity Troponin:   Recent Labs  Lab 07/28/21 1515 07/28/21 2029 08/26/21 1215 08/26/21 1416  TROPONINIHS _0 Chemistry Recent Labs  Lab 08/26/21 1215 08/26/21 1416  NA 138  --   K 3.9  --   CL 102  --   CO2 27  --   GLUCOSE 162*  --   BUN 26*  --   CREATININE 2.61*  --   CALCIUM 9.0  --   MG  --  2.1  GFRNONAA 20*  --   ANIONGAP 9  --     Recent Labs  Lab 08/26/21 1215  PROT 7.8  ALBUMIN 3.2*  AST 41  ALT 41  ALKPHOS 175*  BILITOT 0.5   Lipids No results for input(s): CHOL, TRIG, HDL, LABVLDL, LDLCALC, CHOLHDL in the last 168 hours.  Hematology Recent Labs  Lab 08/26/21 1215  WBC 4.5  RBC 4.24  HGB 12.1  HCT 38.1  MCV 89.9  MCH 28.5  MCHC 31.8  RDW 14.4  PLT 159   Thyroid No results for input(s): TSH, FREET4 in the last 168 hours.  BNP Recent Labs  Lab 08/26/21 1416  BNP 170.2*    DDimer No results for input(s): DDIMER in the last 168 hours.   Radiology/Studies:  DG Chest 2 View  Result Date: 08/26/2021 CLINICAL DATA:  cp EXAM: CHEST - 2 VIEW COMPARISON:  December 2022 FINDINGS: Cardiomegaly. Left atrial appendage clip present. Median sternotomy wires. No pleural effusion. No pneumothorax. No overt edema or lobar consolidation. IMPRESSION: Cardiomegaly.  No overt edema or effusion. Electronically Signed   By: Albin Felling M.D.   On: 08/26/2021 13:15     Assessment and Plan:   Atrial flutter with RVR: -History of atrial fibrillation status post Maze procedure in May 2022  -Patient has been complaining of nausea, vomiting, chest discomfort and shortness of breath for the past 3 days. -She has been compliant with Eliquis until this morning.  She did not take this morning's dose of Eliquis as she was feeling bad.   Unfortunately after she arrived in the emergency room, no Eliquis was given to her either. -will discuss with MD, convert oral amiodarone to IV amiodarone.  If she does not convert, given recurrence of atrial fibrillation, may need to discuss with the EP regarding possibility of AV nodal ablation and pacemaker therapy.  Acute on chronic combined systolic and diastolic heart failure  -Previous discharge dry weight in early December  was 275 pounds.  Her weight today was 298 pounds.  She does not seems to be significantly volume overloaded based on physical exam though.  Her lungs is clear despite productive cough with thick sputum. -She is on 100 mg daily of torsemide at home with twice weekly dosing of 2.5 mg metolazone. -Despite her high dose of diuretic at home, she previously had good diuresis on 80 mg twice daily of IV Lasix.  We will attempt IV diuresis again.  Subjective fever: Patient reported fever of 100.5 yesterday, however he is afebrile in the emergency room.  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  Morbid obesity   Risk Assessment/Risk Scores:     HEAR Score (for undifferentiated chest pain):  HEAR Score: 6  New York Heart Association (NYHA) Functional Class NYHA Class IV  CHA2DS2-VASc Score = 5   This indicates a 7.2% annual risk of stroke. The patient's score is based upon: CHF History: 1 HTN History: 1 Diabetes History: 1 Stroke History: 0 Vascular Disease History: 1 Age Score: 0 Gender Score: 1         For questions or updates, please contact [facility] Please consult [email or web address] for contact info under    Hilbert Corrigan, [place]  08/26/2021 4:52 PM

## 2021-08-26 NOTE — Assessment & Plan Note (Signed)
No signs of exacerbation at this time Continue prn albuterol

## 2021-08-26 NOTE — Assessment & Plan Note (Signed)
Continue crestor 

## 2021-08-26 NOTE — ED Provider Notes (Signed)
[facility] EMERGENCY DEPARTMENT Provider Note   CSN: [id number] Arrival date & time: 08/26/21  1146     History  Chief Complaint  Patient presents with   Chest Pain   Nausea   Emesis   Diarrhea   Leg Swelling    [name] is a 66 y.o. female with a past medical history of a flutter currently anticoagulated, CHF, diabetes, CKD presenting to the ED with chief complaint of chest pain, palpitations and fatigue.  Her symptoms began on 08/23/2021 and have been constant.  She has been compliant with all of her medications except for this morning when she felt like she was too ill to take anything.  Feels like there is "something sitting on my chest."  She denies any worsening lower extremity edema.  Reports some shortness of breath along with her pain.  No recent medication changes.  Denies any abdominal pain.  She has had a few episodes of emesis and diarrhea but "not that much."  Denies any fevers.   Chest Pain Associated symptoms: palpitations, shortness of breath and vomiting   Associated symptoms: no abdominal pain, no cough, no dizziness, no fever, no nausea and no weakness   Emesis Associated symptoms: diarrhea   Associated symptoms: no abdominal pain, no chills, no cough, no fever, no myalgias and no sore throat   Diarrhea Associated symptoms: vomiting   Associated symptoms: no abdominal pain, no chills, no fever and no myalgias       Home Medications Prior to Admission medications   Medication Sig Start Date End Date Taking? Authorizing Provider  acetaminophen (TYLENOL) 500 MG tablet Take 1,000 mg by mouth every 6 (six) hours as needed for mild pain.   Yes [provider]  albuterol (VENTOLIN HFA) 108 (90 Base) MCG/ACT inhaler Inhale 2 puffs into the lungs every 4 (four) hours as needed for wheezing or shortness of breath (Asthma). 08/15/19  Yes [provider]  ALPRAZolam Duanne Moron) 0.5 MG tablet Take 1 tablet (0.5 mg total) by mouth  2 (two) times daily as needed for anxiety. 02/15/21  Yes [name], NP  amiodarone (PACERONE) 200 MG tablet Take 1 tablet (200 mg total) by mouth daily for 7 days, THEN 1 tablet (200 mg total) daily. Patient taking differently: 200 mg daily 07/27/21 08/03/22 Yes Vickie Epley, MD  apixaban (ELIQUIS) 5 MG TABS tablet Take 1 tablet (5 mg total) by mouth 2 (two) times daily. 05/16/21  Yes Milford, Maricela Bo, FNP  busPIRone (BUSPAR) 5 MG tablet TAKE 1 TABLET BY MOUTH TWICE A DAY Patient taking differently: Take 5 mg by mouth 2 (two) times daily. 08/22/21  Yes [name], NP  colchicine 0.6 MG tablet Take 0.3 mg by mouth daily.   Yes [provider]  docusate sodium (COLACE) 100 MG capsule Take 100 mg by mouth 2 (two) times daily.   Yes [provider]  DULoxetine (CYMBALTA) 60 MG capsule Take 1 capsule (60 mg total) by mouth daily. 06/14/21  Yes [name], NP  EPINEPHrine 0.3 mg/0.3 mL IJ SOAJ injection Inject 0.3 mg into the muscle as needed for anaphylaxis. 06/25/20  Yes [name], NP  fluticasone (FLONASE) 50 MCG/ACT nasal spray Place 2 sprays into both nostrils daily. 05/24/21  Yes [name], NP  insulin aspart (NOVOLOG) 100 UNIT/ML injection Inject 12 Units into the skin 3 (three) times daily before meals. For blood sugars 0-199 give 0 units of insulin, 201-250 give 4  units, 251-300 give 6 units, 301-350 give 8 units, 351-400 give 10 units,> 400 give 12 units and call M.D. Patient taking differently: Inject 2-10 Units into the skin 3 (three) times daily before meals. Per sliding scale 06/24/20  Yes [name], NP  loratadine (CLARITIN) 10 MG tablet Take 1 tablet (10 mg total) by mouth daily. 05/24/21  Yes [name], NP  metolazone (ZAROXOLYN) 2.5 MG tablet Take 1 tablet (2.5 mg total) by mouth 2 (two) times a week. Take every Monday and Friday 05/16/21 08/26/21 Yes Milford, Maricela Bo, FNP  metoprolol succinate (TOPROL XL)  25 MG 24 hr tablet Take 1.5 tablets (37.5 mg total) by mouth daily. 04/26/21 04/26/22 Yes Sherran Needs, NP  Multiple Vitamins-Minerals (MULTIVITAMIN WOMEN PO) Take 1 tablet by mouth daily.   Yes [provider]  nitroGLYCERIN (NITROSTAT) 0.4 MG SL tablet Place 1 tablet (0.4 mg total) under the tongue every 5 (five) minutes x 3 doses as needed for chest pain. 01/08/14  Yes Charolette Forward, MD  pantoprazole (PROTONIX) 40 MG tablet TAKE 1 TABLET BY MOUTH TWICE A DAY Patient taking differently: Take 40 mg by mouth daily. 07/06/21  Yes [name], NP  potassium chloride SA (KLOR-CON) 20 MEQ tablet Take 4 tablets (80 mEq total) by mouth daily. Take an additional 40 meq with every dose of METOLAZONE Patient taking differently: Take 40 mEq by mouth daily. Take an additional 20 meq with every dose of METOLAZONE 07/13/21  Yes Joette Catching, PA-C  RESTASIS 0.05 % ophthalmic emulsion Place 1 drop into both eyes 2 (two) times daily. 11/19/17  Yes [provider]  rosuvastatin (CRESTOR) 10 MG tablet Take 1 tablet (10 mg total) by mouth daily. Patient taking differently: Take 10 mg by mouth every evening. 05/16/21  Yes Milford, Maricela Bo, FNP  torsemide (DEMADEX) 100 MG tablet Take 100 mg by mouth daily. 07/11/21  Yes [provider]  TRULICITY 1.5 JY/7.8GN SOPN INJECT 1.5 MG INTO THE SKIN ONCE A WEEK. 08/19/21 11/17/21 Yes [name], NP  Alcohol Swabs (B-D SINGLE USE SWABS REGULAR) PADS 1 each by Other route daily. 07/06/21   [name], NP  amoxicillin-clavulanate (AUGMENTIN) 875-125 MG tablet Take 1 tablet by mouth 2 (two) times daily. Patient not taking: Reported on 08/26/2021 08/25/21   Mayers, Cari S, PA-C  benzonatate (TESSALON) 200 MG capsule Take 1 capsule (200 mg total) by mouth 2 (two) times daily as needed for cough. Patient not taking: Reported on 08/26/2021 08/25/21   Mayers, Cari S, PA-C  blood glucose meter kit and supplies KIT Dispense based on  patient and insurance preference. Use up to four times daily as directed. 02/10/21   Patrecia Pour, MD  blood glucose meter kit and supplies Dispense based on patient and insurance preference. Use up to four times daily as directed. (FOR ICD-10 E10.9, E11.9). 07/06/21   [name], NP  Blood Glucose Monitoring Suppl (ACCU-CHEK GUIDE ME) w/Device KIT Use as instructed to check blood sugar three times daily. E11.69 04/21/20   Charlott Rakes, MD  glucose blood (ACCU-CHEK GUIDE) test strip Use to check blood sugar TID. E11.69 07/13/21   Charlott Rakes, MD      Allergies    Bee pollen and Sulfa antibiotics    Review of Systems   Review of Systems  Constitutional:  Negative for appetite change, chills and fever.  HENT:  Negative for ear pain, rhinorrhea, sneezing and sore throat.   Eyes:  Negative for photophobia and visual disturbance.  Respiratory:  Positive for shortness of breath. Negative for cough, chest tightness and wheezing.   Cardiovascular:  Positive for chest pain and palpitations.  Gastrointestinal:  Positive for diarrhea and vomiting. Negative for abdominal pain, blood in stool, constipation and nausea.  Genitourinary:  Negative for dysuria, hematuria and urgency.  Musculoskeletal:  Negative for myalgias.  Skin:  Negative for rash.  Neurological:  Negative for dizziness, weakness and light-headedness.   Physical Exam Updated Vital Signs BP (!) 101/53    Pulse (!) 113    Temp 99 F (37.2 C)    Resp (!) 28    Ht 5' 7" (1.702 m)    Wt 135.2 kg    SpO2 99%    BMI 46.67 kg/m  Physical Exam Vitals and nursing note reviewed.  Constitutional:      General: She is not in acute distress.    Appearance: She is well-developed.  HENT:     Head: Normocephalic and atraumatic.     Nose: Nose normal.  Eyes:     General: No scleral icterus.       Left eye: No discharge.     Conjunctiva/sclera: Conjunctivae normal.  Cardiovascular:     Rate and Rhythm: Regular rhythm. Tachycardia  present.     Heart sounds: Normal heart sounds. No murmur heard.   No friction rub. No gallop.  Pulmonary:     Effort: Pulmonary effort is normal. No respiratory distress.     Breath sounds: Normal breath sounds.  Abdominal:     General: Bowel sounds are normal. There is no distension.     Palpations: Abdomen is soft.     Tenderness: There is no abdominal tenderness. There is no guarding.  Musculoskeletal:        General: Normal range of motion.     Cervical back: Normal range of motion and neck supple.  Skin:    General: Skin is warm and dry.     Findings: No rash.  Neurological:     Mental Status: She is alert.     Motor: No abnormal muscle tone.     Coordination: Coordination normal.    ED Results / Procedures / Treatments   Labs (all labs ordered are listed, but only abnormal results are displayed) Labs Reviewed  COMPREHENSIVE METABOLIC PANEL - Abnormal; Notable for the following components:      Result Value   Glucose, Bld 162 (*)    BUN 26 (*)    Creatinine, Ser 2.61 (*)    Albumin 3.2 (*)    Alkaline Phosphatase 175 (*)    GFR, Estimated 20 (*)    All other components within normal limits  BRAIN NATRIURETIC PEPTIDE - Abnormal; Notable for the following components:   B Natriuretic Peptide 170.2 (*)    All other components within normal limits  RESP PANEL BY RT-PCR (FLU A&B, COVID) ARPGX2  LIPASE, BLOOD  CBC  MAGNESIUM  URINALYSIS, ROUTINE W REFLEX MICROSCOPIC  TSH  TROPONIN I (HIGH SENSITIVITY)  TROPONIN I (HIGH SENSITIVITY)    EKG None  Radiology DG Chest 2 View  Result Date: 08/26/2021 CLINICAL DATA:  cp EXAM: CHEST - 2 VIEW COMPARISON:  December 2022 FINDINGS: Cardiomegaly. Left atrial appendage clip present. Median sternotomy wires. No pleural effusion. No pneumothorax. No overt edema or lobar consolidation. IMPRESSION: Cardiomegaly.  No overt edema or effusion. Electronically Signed   By: Albin Felling M.D.   On: 08/26/2021 13:15     Procedures .  Critical Care Performed by: Delia Heady, PA-C Authorized by: Delia Heady, PA-C   Critical care provider statement:    Critical care time (minutes):  35   Critical care time was exclusive of:  Separately billable procedures and treating other patients and teaching time   Critical care was necessary to treat or prevent imminent or life-threatening deterioration of the following conditions:  Circulatory failure, respiratory failure and cardiac failure   Critical care was time spent personally by me on the following activities:  Development of treatment plan with patient or surrogate, discussions with consultants, evaluation of patient's response to treatment, ordering and performing treatments and interventions, ordering and review of laboratory studies, ordering and review of radiographic studies, pulse oximetry, re-evaluation of patient's condition and review of old charts   I assumed direction of critical care for this patient from another provider in my specialty: no     Care discussed with: admitting provider      Medications Ordered in ED Medications  diltiazem (CARDIZEM) 125 mg in dextrose 5% 125 mL (1 mg/mL) infusion (5 mg/hr Intravenous New Bag/Given 08/26/21 1534)  0.9 %  sodium chloride infusion ( Intravenous New Bag/Given 08/26/21 1425)  diltiazem (CARDIZEM) injection 20 mg (20 mg Intravenous Given 08/26/21 1426)    ED Course/ Medical Decision Making/ A&P Clinical Course as of 08/26/21 1613  Fri Aug 26, 2021  1405 Heart rate improved to 100.  Blood pressure 350 systolic [HK]  0938 Spoke to cardiology who will evaluation the patient in consult. [HK]    Clinical Course User Index [HK] Delia Heady, PA-C                           Medical Decision Making  66 year old female with past medical history of a flutter, diabetes, hypertension presenting to the ED with chief complaint of chest pain, palpitations for the past 3 days.  Symptoms have been constant.  She has  been compliant with all of her medications except for this morning when she was unable to take anything due to feeling ill.  She reports generalized weakness.  Denies any abdominal pain or fevers.  On exam patient without calf tenderness.  Her heart rates in the 130s and EKG showing a flutter.  Oxygen saturations are 99% on room air.  Her TSH has been normal but we will recheck this.  Creatinine is around baseline.  Will check BNP, magnesium as well as troponin.  Chest x-ray shows cardiomegaly without evidence of edema.  We will give 1 dose of Cardizem and reassess.  Her heart rate improved with 1 dose of Cardizem however decreased back up to 130s.  We will begin her on a Cardizem drip.  Have consulted cardiology who will evaluate her at the bedside but will admit to hospitalist service for ongoing management of this.    Portions of this note were generated with Lobby[employer]. Dictation errors may occur despite best attempts at proofreading.        Final Clinical Impression(s) / ED Diagnoses Final diagnoses:  Atrial flutter, unspecified type [facility])    Rx / DC Orders ED Discharge Orders     None         Delia Heady, PA-C 08/26/21 1613    Horton, Alvin Critchley, DO 08/29/21 1500

## 2021-08-26 NOTE — Assessment & Plan Note (Signed)
Followed outpatient by nephrology Baseline appears to be around 2.2-3.5 Creatinine today is 2.61.  Continue to monitor with IV diuresis

## 2021-08-26 NOTE — Assessment & Plan Note (Signed)
Last a1c was 6.2 in 07/2021 SSI and accuchecks per protocol

## 2021-08-27 DIAGNOSIS — I493 Ventricular premature depolarization: Secondary | ICD-10-CM | POA: Diagnosis not present

## 2021-08-27 DIAGNOSIS — E876 Hypokalemia: Secondary | ICD-10-CM | POA: Diagnosis present

## 2021-08-27 DIAGNOSIS — R55 Syncope and collapse: Secondary | ICD-10-CM | POA: Diagnosis not present

## 2021-08-27 DIAGNOSIS — N179 Acute kidney failure, unspecified: Secondary | ICD-10-CM | POA: Diagnosis not present

## 2021-08-27 DIAGNOSIS — I5043 Acute on chronic combined systolic (congestive) and diastolic (congestive) heart failure: Secondary | ICD-10-CM | POA: Diagnosis present

## 2021-08-27 DIAGNOSIS — Z952 Presence of prosthetic heart valve: Secondary | ICD-10-CM | POA: Diagnosis not present

## 2021-08-27 DIAGNOSIS — I10 Essential (primary) hypertension: Secondary | ICD-10-CM | POA: Diagnosis not present

## 2021-08-27 DIAGNOSIS — U071 COVID-19: Secondary | ICD-10-CM | POA: Diagnosis present

## 2021-08-27 DIAGNOSIS — F32A Depression, unspecified: Secondary | ICD-10-CM | POA: Diagnosis present

## 2021-08-27 DIAGNOSIS — F419 Anxiety disorder, unspecified: Secondary | ICD-10-CM | POA: Diagnosis present

## 2021-08-27 DIAGNOSIS — N184 Chronic kidney disease, stage 4 (severe): Secondary | ICD-10-CM

## 2021-08-27 DIAGNOSIS — I130 Hypertensive heart and chronic kidney disease with heart failure and stage 1 through stage 4 chronic kidney disease, or unspecified chronic kidney disease: Secondary | ICD-10-CM | POA: Diagnosis present

## 2021-08-27 DIAGNOSIS — I484 Atypical atrial flutter: Secondary | ICD-10-CM | POA: Diagnosis present

## 2021-08-27 DIAGNOSIS — G4733 Obstructive sleep apnea (adult) (pediatric): Secondary | ICD-10-CM | POA: Diagnosis present

## 2021-08-27 DIAGNOSIS — I071 Rheumatic tricuspid insufficiency: Secondary | ICD-10-CM | POA: Diagnosis present

## 2021-08-27 DIAGNOSIS — E785 Hyperlipidemia, unspecified: Secondary | ICD-10-CM | POA: Diagnosis present

## 2021-08-27 DIAGNOSIS — Z6841 Body Mass Index (BMI) 40.0 and over, adult: Secondary | ICD-10-CM | POA: Diagnosis not present

## 2021-08-27 DIAGNOSIS — Z9889 Other specified postprocedural states: Secondary | ICD-10-CM | POA: Diagnosis not present

## 2021-08-27 DIAGNOSIS — Z7901 Long term (current) use of anticoagulants: Secondary | ICD-10-CM | POA: Diagnosis not present

## 2021-08-27 DIAGNOSIS — I480 Paroxysmal atrial fibrillation: Secondary | ICD-10-CM | POA: Diagnosis present

## 2021-08-27 DIAGNOSIS — I4892 Unspecified atrial flutter: Secondary | ICD-10-CM | POA: Diagnosis not present

## 2021-08-27 DIAGNOSIS — J9601 Acute respiratory failure with hypoxia: Secondary | ICD-10-CM | POA: Diagnosis present

## 2021-08-27 DIAGNOSIS — R197 Diarrhea, unspecified: Secondary | ICD-10-CM | POA: Diagnosis present

## 2021-08-27 DIAGNOSIS — E1122 Type 2 diabetes mellitus with diabetic chronic kidney disease: Secondary | ICD-10-CM | POA: Diagnosis present

## 2021-08-27 DIAGNOSIS — J449 Chronic obstructive pulmonary disease, unspecified: Secondary | ICD-10-CM | POA: Diagnosis present

## 2021-08-27 DIAGNOSIS — R7989 Other specified abnormal findings of blood chemistry: Secondary | ICD-10-CM | POA: Diagnosis not present

## 2021-08-27 LAB — BASIC METABOLIC PANEL
Anion gap: 10 (ref 5–15)
BUN: 27 mg/dL — ABNORMAL HIGH (ref 8–23)
CO2: 28 mmol/L (ref 22–32)
Calcium: 8.5 mg/dL — ABNORMAL LOW (ref 8.9–10.3)
Chloride: 101 mmol/L (ref 98–111)
Creatinine, Ser: 2.62 mg/dL — ABNORMAL HIGH (ref 0.44–1.00)
GFR, Estimated: 20 mL/min — ABNORMAL LOW (ref >60)
Glucose, Bld: 137 mg/dL — ABNORMAL HIGH (ref 70–99)
Potassium: 3.2 mmol/L — ABNORMAL LOW (ref 3.5–5.1)
Sodium: 139 mmol/L (ref 135–145)

## 2021-08-27 LAB — URINALYSIS, ROUTINE W REFLEX MICROSCOPIC
Bilirubin Urine: NEGATIVE
Glucose, UA: NEGATIVE mg/dL
Ketones, ur: NEGATIVE mg/dL
Leukocytes,Ua: NEGATIVE
Nitrite: NEGATIVE
Protein, ur: NEGATIVE mg/dL
Specific Gravity, Urine: 1.015 (ref 1.005–1.030)
pH: 6 (ref 5.0–8.0)

## 2021-08-27 LAB — C-REACTIVE PROTEIN: CRP: 1.2 mg/dL — ABNORMAL HIGH (ref <1.0)

## 2021-08-27 LAB — CBC
HCT: 35.6 % — ABNORMAL LOW (ref 36.0–46.0)
Hemoglobin: 11.4 g/dL — ABNORMAL LOW (ref 12.0–15.0)
MCH: 29.1 pg (ref 26.0–34.0)
MCHC: 32 g/dL (ref 30.0–36.0)
MCV: 90.8 fL (ref 80.0–100.0)
Platelets: 133 10*3/uL — ABNORMAL LOW (ref 150–400)
RBC: 3.92 MIL/uL (ref 3.87–5.11)
RDW: 14.6 % (ref 11.5–15.5)
WBC: 3.9 10*3/uL — ABNORMAL LOW (ref 4.0–10.5)
nRBC: 0 % (ref 0.0–0.2)

## 2021-08-27 LAB — GLUCOSE, CAPILLARY
Glucose-Capillary: 175 mg/dL — ABNORMAL HIGH (ref 70–99)
Glucose-Capillary: 125 mg/dL — ABNORMAL HIGH (ref 70–99)

## 2021-08-27 LAB — CBG MONITORING, ED: Glucose-Capillary: 218 mg/dL — ABNORMAL HIGH (ref 70–99)

## 2021-08-27 LAB — POTASSIUM: Potassium: 4.4 mmol/L (ref 3.5–5.1)

## 2021-08-27 LAB — URINALYSIS, MICROSCOPIC (REFLEX)

## 2021-08-27 LAB — HEMOGLOBIN A1C
Hgb A1c MFr Bld: 6.5 % — ABNORMAL HIGH (ref 4.8–5.6)
Mean Plasma Glucose: 139.85 mg/dL

## 2021-08-27 LAB — BRAIN NATRIURETIC PEPTIDE: B Natriuretic Peptide: 31.4 pg/mL (ref 0.0–100.0)

## 2021-08-27 MED ORDER — INSULIN ASPART 100 UNIT/ML IJ SOLN
0.0000 [IU] | Freq: Three times a day (TID) | INTRAMUSCULAR | Status: DC
  Administered 2021-08-27: 3 [IU] via SUBCUTANEOUS
  Administered 2021-08-27 – 2021-08-28 (×2): 2 [IU] via SUBCUTANEOUS
  Administered 2021-08-28 – 2021-08-30 (×3): 1 [IU] via SUBCUTANEOUS
  Administered 2021-08-31 (×2): 2 [IU] via SUBCUTANEOUS
  Administered 2021-08-31: 3 [IU] via SUBCUTANEOUS
  Administered 2021-09-01: 7 [IU] via SUBCUTANEOUS
  Administered 2021-09-01: 2 [IU] via SUBCUTANEOUS
  Administered 2021-09-01: 1 [IU] via SUBCUTANEOUS
  Administered 2021-09-02: 3 [IU] via SUBCUTANEOUS
  Administered 2021-09-02: 2 [IU] via SUBCUTANEOUS
  Administered 2021-09-02 – 2021-09-03 (×2): 3 [IU] via SUBCUTANEOUS
  Administered 2021-09-03: 5 [IU] via SUBCUTANEOUS
  Administered 2021-09-04: 2 [IU] via SUBCUTANEOUS
  Administered 2021-09-04: 7 [IU] via SUBCUTANEOUS

## 2021-08-27 MED ORDER — INSULIN ASPART 100 UNIT/ML IJ SOLN
0.0000 [IU] | Freq: Every day | INTRAMUSCULAR | Status: DC
  Administered 2021-08-30 – 2021-08-31 (×2): 4 [IU] via SUBCUTANEOUS
  Administered 2021-09-01 – 2021-09-03 (×3): 5 [IU] via SUBCUTANEOUS

## 2021-08-27 MED ORDER — MAGNESIUM SULFATE 2 GM/50ML IV SOLN
2.0000 g | Freq: Once | INTRAVENOUS | Status: DC
Start: 2021-08-27 — End: 2021-08-27

## 2021-08-27 MED ORDER — POTASSIUM CHLORIDE CRYS ER 20 MEQ PO TBCR
40.0000 meq | EXTENDED_RELEASE_TABLET | Freq: Two times a day (BID) | ORAL | Status: DC
  Administered 2021-08-27 – 2021-08-29 (×5): 40 meq via ORAL
  Filled 2021-08-27 (×5): qty 2

## 2021-08-27 MED ORDER — POTASSIUM CHLORIDE CRYS ER 20 MEQ PO TBCR
40.0000 meq | EXTENDED_RELEASE_TABLET | Freq: Once | ORAL | Status: AC
Start: 2021-08-27 — End: 2021-08-27
  Administered 2021-08-27: 40 meq via ORAL
  Filled 2021-08-27: qty 2

## 2021-08-27 MED ORDER — MAGNESIUM SULFATE IN D5W 1-5 GM/100ML-% IV SOLN
1.0000 g | Freq: Once | INTRAVENOUS | Status: AC
  Administered 2021-08-27: 1 g via INTRAVENOUS
  Filled 2021-08-27: qty 100

## 2021-08-27 MED ORDER — GUAIFENESIN-DM 100-10 MG/5ML PO SYRP
10.0000 mL | ORAL_SOLUTION | ORAL | Status: DC | PRN
  Administered 2021-08-27 – 2021-09-03 (×17): 10 mL via ORAL
  Filled 2021-08-27 (×19): qty 10

## 2021-08-27 MED ORDER — SPIRONOLACTONE 25 MG PO TABS
25.0000 mg | ORAL_TABLET | Freq: Every day | ORAL | Status: DC
  Administered 2021-08-27 – 2021-08-29 (×3): 25 mg via ORAL
  Filled 2021-08-27 (×3): qty 1

## 2021-08-27 NOTE — Progress Notes (Signed)
PROGRESS NOTE                                                                                                                                                                                                             Patient Demographics:    [name], is a 66 y.o. female, DOB - 12-06-1955, PHX:[id number]  Outpatient Primary MD for the patient is [name], NP    LOS - 0  Admit date - 08/26/2021    Chief Complaint  Patient presents with   Chest Pain   Nausea   Emesis   Diarrhea   Leg Swelling       Brief Narrative (HPI from H&P)   [name] is a 66 y.o. female with medical history significant of PAF on  eliquis with hx of multiple cardioversion's, severe MR/TR,  A1KP, chronic diastolic/systolic CHF, CKD stage IV, severe MR s/p mitral valve replacement in 12/2020, OSA, COPD who presented to ED with chest pain and shortness of breath x 3 days, in the ER she was found to be in A. fib RVR along with acute on chronic systolic CHF and admitted to the hospital.  Her work-up further showed she also had incidental COVID-19 infection.   Subjective:    [name] today has, No headache, No chest pain, No abdominal pain - No Nausea, No new weakness tingling or numbness, improved SOB.   Assessment  & Plan :     Acute Hypoxic Resp. Failure due to Acute on chronic systolic heart failure EF around 20%. - she has massive fluid overload and she is currently on IV Lasix along with beta-blocker, have added Aldactone, continue supplemental oxygen and monitor.  Cardiology on board.  2.  Incidental COVID-19 infection.  She is partially vaccinated for COVID-19 we will loosely monitor inflammatory markers are clinically.  3.  Paroxysmal A. fib in RVR this admission.  Mali vas 2 score of greater than 5.  She has had difficult to control rhythm and rate in the past has had multiple cardioversions, for now  IV amiodarone drip, oral beta-blocker and Eliquis.  Cardiology on board  4.  Severe MR/TR.  Supportive care.  Recent echo noted below.  5.  COPD.  At baseline no wheezing continue supportive care.  6.  Obesity with OSA.  BMI of 46 follow with PCP  for weight loss, nighttime CPAP.  Noncompliant.  Counseled.  7.  CKD 3B.  Baseline creatinine around 3.  Monitor with diuresis.  8.  Hypokalemia.  Replaced.  9.  Dyslipidemia.  On Crestor.  10.  GERD.  PPI.  11.  DM type II.  SSI  Lab Results  Component Value Date   HGBA1C 6.2 (H) 07/29/2021    CBG (last 3)  No results for input(s): GLUCAP in the last 72 hours.         Condition - Extremely Guarded  Family Communication  :  None present  Code Status :  Full  Consults  :  Cards  PUD Prophylaxis : PPI   Procedures  :      TTE 07/29/21 -   1. Left ventricular ejection fraction, by estimation, is 40 to 45%. The left ventricle has mildly decreased function. The left ventricle  demonstrates global hypokinesis. There is mild left ventricular hypertrophy. Left ventricular diastolic parameters are indeterminate.   2. Right ventricular systolic function is mildly reduced. The right ventricular size is normal. There is normal pulmonary artery systolic pressure.   3. Left atrial size was moderately dilated.   4. Right atrial size was mildly dilated.   5. HR 90 bpm. MV peak gradient, 14.1 mmHg. The mean mitral valve gradient is 6.5 mmHg      . There is a 30 mm prosthetic annuloplasty ring present in the mitral position.   6. S/p repair with an annuloplasty ring.   7. The aortic valve is normal in structure. Aortic valve regurgitation is not visualized. No aortic stenosis is present.   8. The inferior vena cava is normal in size with greater than 50% respiratory variability, suggesting right atrial pressure of 3 mmHg.      Disposition Plan  :    Status is: Observation  DVT Prophylaxis  :    apixaban (ELIQUIS) tablet 5 mg      Lab Results  Component Value Date   PLT 133 (L) 08/27/2021    Diet :  Diet Order             Diet heart healthy/carb modified Room service appropriate? Yes; Fluid consistency: Thin  Diet effective now                    Inpatient Medications  Scheduled Meds:  apixaban  5 mg Oral BID   busPIRone  5 mg Oral BID   colchicine  0.3 mg Oral Daily   cycloSPORINE  1 drop Both Eyes BID   docusate sodium  100 mg Oral BID   DULoxetine  60 mg Oral Daily   fluticasone  2 spray Each Nare Daily   furosemide  80 mg Intravenous BID   loratadine  10 mg Oral Daily   metoprolol succinate  37.5 mg Oral Daily   multivitamin with minerals  1 tablet Oral Daily   pantoprazole  40 mg Oral Daily   potassium chloride  40 mEq Oral BID   potassium chloride  40 mEq Oral Once   rosuvastatin  10 mg Oral QPM   sodium chloride flush  3 mL Intravenous Q12H   spironolactone  25 mg Oral Daily   Continuous Infusions:  sodium chloride     amiodarone 30 mg/hr (08/26/21 2216)   PRN Meds:.sodium chloride, acetaminophen **OR** acetaminophen, albuterol, ALPRAZolam, nitroGLYCERIN, sodium chloride flush  Antibiotics  :    Anti-infectives (From admission, onward)    None  Time Spent in minutes  30   Lala Lund M.D on 08/27/2021 at 10:49 AM  To page go to [email or web address]   [facility] -  Office  [phone]  See all Orders from today for further details    Objective:   Vitals:   08/27/21 0645 08/27/21 0730 08/27/21 0815 08/27/21 0945  BP:  136/65 136/65 122/79  Pulse: 81 84 84 86  Resp: (!) 22 20 16 17   Temp:   99.2 F (37.3 C)   TempSrc:   Oral   SpO2: 95% 94% 98% 98%  Weight:      Height:        Wt Readings from Last 3 Encounters:  08/26/21 135.2 kg  08/18/21 127.5 kg  08/11/21 127.5 kg     Intake/Output Summary (Last 24 hours) at 08/27/2021 1049 Last data filed at 08/27/2021 0049 Gross per 24 hour  Intake 200 ml  Output 900 ml  Net -700 ml     Physical  Exam  Awake Alert, No new F.N deficits, Normal affect [place].AT,PERRAL Supple Neck, No JVD,   Symmetrical Chest wall movement, Good air movement bilaterally,+ve rales RRR,No Gallops,Rubs or new Murmurs,  +ve B.Sounds, Abd Soft, No tenderness,   2+ leg edema   Data Review:    CBC Recent Labs  Lab 08/26/21 1215 08/27/21 0232  WBC 4.5 3.9*  HGB 12.1 11.4*  HCT 38.1 35.6*  PLT 159 133*  MCV 89.9 90.8  MCH 28.5 29.1  MCHC 31.8 32.0  RDW 14.4 14.6    Electrolytes Recent Labs  Lab 08/26/21 1215 08/26/21 1416 08/27/21 0232  NA 138  --  139  K 3.9  --  3.2*  CL 102  --  101  CO2 27  --  28  GLUCOSE 162*  --  137*  BUN 26*  --  27*  CREATININE 2.61*  --  2.62*  CALCIUM 9.0  --  8.5*  AST 41  --   --   ALT 41  --   --   ALKPHOS 175*  --   --   BILITOT 0.5  --   --   ALBUMIN 3.2*  --   --   MG  --  2.1  --   BNP  --  170.2*  --     ------------------------------------------------------------------------------------------------------------------ No results for input(s): CHOL, HDL, LDLCALC, TRIG, CHOLHDL, LDLDIRECT in the last 72 hours.  Lab Results  Component Value Date   HGBA1C 6.2 (H) 07/29/2021    No results for input(s): TSH, T4TOTAL, T3FREE, THYROIDAB in the last 72 hours.  Invalid input(s): FREET3 ------------------------------------------------------------------------------------------------------------------ ID Labs Recent Labs  Lab 08/26/21 1215 08/27/21 0232  WBC 4.5 3.9*  PLT 159 133*  CREATININE 2.61* 2.62*   Cardiac Enzymes No results for input(s): CKMB, TROPONINI, MYOGLOBIN in the last 168 hours.  Invalid input(s): CK  Radiology Reports DG Chest 2 View  Result Date: 08/26/2021 CLINICAL DATA:  cp EXAM: CHEST - 2 VIEW COMPARISON:  December 2022 FINDINGS: Cardiomegaly. Left atrial appendage clip present. Median sternotomy wires. No pleural effusion. No pneumothorax. No overt edema or lobar consolidation. IMPRESSION: Cardiomegaly.  No overt  edema or effusion. Electronically Signed   By: Albin Felling M.D.   On: 08/26/2021 13:15

## 2021-08-27 NOTE — Progress Notes (Signed)
Pt is having increased Trigeminy PVCs while sleeping  MD notified  Cardiology notified

## 2021-08-27 NOTE — Progress Notes (Signed)
Pt refusing CPAP for tonight 

## 2021-08-27 NOTE — Progress Notes (Signed)
Progress Note  Patient Name: [name] Date of Encounter: 08/27/2021  [facility] Cardiologist: Buford Dresser, MD   Subjective   Swab yesterday came back positive for Covid. She has been extremely cautious in terms of minimizing exposure risk, though notes a few times she has left the house (but worn a mask). She did have a fever at home and has been coughing. She converted back to sinus rhythm yesterday at 19:44. She endorses excellent urine output on the lasix. Still coughing, has a headache and some sinus congestion.  Inpatient Medications    Scheduled Meds:  apixaban  5 mg Oral BID   busPIRone  5 mg Oral BID   colchicine  0.3 mg Oral Daily   cycloSPORINE  1 drop Both Eyes BID   docusate sodium  100 mg Oral BID   DULoxetine  60 mg Oral Daily   fluticasone  2 spray Each Nare Daily   furosemide  80 mg Intravenous BID   insulin aspart  0-5 Units Subcutaneous QHS   insulin aspart  0-9 Units Subcutaneous TID WC   loratadine  10 mg Oral Daily   metoprolol succinate  37.5 mg Oral Daily   multivitamin with minerals  1 tablet Oral Daily   pantoprazole  40 mg Oral Daily   potassium chloride  40 mEq Oral BID   potassium chloride  40 mEq Oral Once   rosuvastatin  10 mg Oral QPM   sodium chloride flush  3 mL Intravenous Q12H   spironolactone  25 mg Oral Daily   Continuous Infusions:  sodium chloride     amiodarone 30 mg/hr (08/27/21 1131)   PRN Meds: sodium chloride, acetaminophen **OR** acetaminophen, albuterol, ALPRAZolam, guaiFENesin-dextromethorphan, nitroGLYCERIN, sodium chloride flush   Vital Signs    Vitals:   08/27/21 0645 08/27/21 0730 08/27/21 0815 08/27/21 0945  BP:  136/65 136/65 122/79  Pulse: 81 84 84 86  Resp: (!) 22 20 16 17   Temp:   99.2 F (37.3 C)   TempSrc:   Oral   SpO2: 95% 94% 98% 98%  Weight:      Height:        Intake/Output Summary (Last 24 hours) at 08/27/2021 1209 Last data filed at 08/27/2021 1131 Gross per 24 hour   Intake 420.36 ml  Output 900 ml  Net -479.64 ml   Last 3 Weights 08/26/2021 08/18/2021 08/11/2021  Weight (lbs) 298 lb 281 lb 281 lb  Weight (kg) 135.172 kg 127.461 kg 127.461 kg  Some encounter information is confidential and restricted. Go to Review Flowsheets activity to see all data.      Telemetry    Aflutter, converted to sinus rhythm 08/26/21 at 19:44 - Personally Reviewed  ECG    No new since 08/26/21 - Personally Reviewed  Physical Exam   GEN: No acute distress.   Neck: difficult to assess JVD 2/2 body habitus Cardiac: RRR, no rubs, or gallops. 2/6 systolic murmur Respiratory: Clear to auscultation bilaterally. Some rhonchi that improve with cough GI: Soft, nontender, non-distended  MS: Firm but nonpitting bilateral LE edema; No deformity. Neuro:  Nonfocal  Psych: Normal affect   Labs    High Sensitivity Troponin:   Recent Labs  Lab 07/28/21 1515 07/28/21 2029 08/26/21 1215 08/26/21 1416  TROPONINIHS 7 7 10 10      Chemistry Recent Labs  Lab 08/26/21 1215 08/26/21 1416 08/27/21 0232  NA 138  --  139  K 3.9  --  3.2*  CL 102  --  101  CO2 27  --  28  GLUCOSE 162*  --  137*  BUN 26*  --  27*  CREATININE 2.61*  --  2.62*  CALCIUM 9.0  --  8.5*  MG  --  2.1  --   PROT 7.8  --   --   ALBUMIN 3.2*  --   --   AST 41  --   --   ALT 41  --   --   ALKPHOS 175*  --   --   BILITOT 0.5  --   --   GFRNONAA 20*  --  20*  ANIONGAP 9  --  10    Lipids No results for input(s): CHOL, TRIG, HDL, LABVLDL, LDLCALC, CHOLHDL in the last 168 hours.  Hematology Recent Labs  Lab 08/26/21 1215 08/27/21 0232  WBC 4.5 3.9*  RBC 4.24 3.92  HGB 12.1 11.4*  HCT 38.1 35.6*  MCV 89.9 90.8  MCH 28.5 29.1  MCHC 31.8 32.0  RDW 14.4 14.6  PLT 159 133*   Thyroid No results for input(s): TSH, FREET4 in the last 168 hours.  BNP Recent Labs  Lab 08/26/21 1416  BNP 170.2*    DDimer No results for input(s): DDIMER in the last 168 hours.   Radiology    DG Chest 2  View  Result Date: 08/26/2021 CLINICAL DATA:  cp EXAM: CHEST - 2 VIEW COMPARISON:  December 2022 FINDINGS: Cardiomegaly. Left atrial appendage clip present. Median sternotomy wires. No pleural effusion. No pneumothorax. No overt edema or lobar consolidation. IMPRESSION: Cardiomegaly.  No overt edema or effusion. Electronically Signed   By: Albin Felling M.D.   On: 08/26/2021 13:15    Cardiac Studies   Prior studies reviewed  Patient Profile     66 y.o. 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 She presents after having elevated heart rates since 1/4. She also notes a 20 lb weight gain since her hospital discharge on 07/31/21. Found to be Covid positive on testing.  Assessment & Plan    Covid positive History of atrial fibrillation, s/p MAZE Atypical atrial flutter with RVR -she has been on multiple agents in the past. Was last in sinus rhythm on amiodarone, but returned to flutter. -her Covid swab came back positive. She is extremely careful re: exposures but has been out (with a mask) a few times. She did have a fever at home. This could potentially be what triggered her return to flutter and her acute on chronic heart failure -she converted back to sinus last evening. Would continue IV amiodarone for another day, then transition back to oral -see note 1/6 re: potential future ablate and pace strategy. 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 I/O not well tracked, no weights yet  today but reports improved output  Hypokalemia: repletion already ordered  For questions or updates, please contact [place] Please consult [email or web address] for contact info under        Signed, Buford Dresser, MD  08/27/2021, 12:09 PM

## 2021-08-27 NOTE — ED Notes (Signed)
Pt ambulated from hallway to hospital bed.  Sats remained at 96% w/rr of 26.  HR remained in 80's NSR with no increase in PVC's.

## 2021-08-28 DIAGNOSIS — I4892 Unspecified atrial flutter: Secondary | ICD-10-CM | POA: Diagnosis not present

## 2021-08-28 DIAGNOSIS — N184 Chronic kidney disease, stage 4 (severe): Secondary | ICD-10-CM | POA: Diagnosis not present

## 2021-08-28 DIAGNOSIS — I484 Atypical atrial flutter: Secondary | ICD-10-CM | POA: Diagnosis not present

## 2021-08-28 DIAGNOSIS — I5043 Acute on chronic combined systolic (congestive) and diastolic (congestive) heart failure: Secondary | ICD-10-CM | POA: Diagnosis not present

## 2021-08-28 LAB — CBC WITH DIFFERENTIAL/PLATELET
Abs Immature Granulocytes: 0.02 10*3/uL (ref 0.00–0.07)
Basophils Absolute: 0 10*3/uL (ref 0.0–0.1)
Basophils Relative: 0 %
Eosinophils Absolute: 0.2 10*3/uL (ref 0.0–0.5)
Eosinophils Relative: 4 %
HCT: 34.3 % — ABNORMAL LOW (ref 36.0–46.0)
Hemoglobin: 10.8 g/dL — ABNORMAL LOW (ref 12.0–15.0)
Immature Granulocytes: 0 %
Lymphocytes Relative: 34 %
Lymphs Abs: 1.6 10*3/uL (ref 0.7–4.0)
MCH: 28.6 pg (ref 26.0–34.0)
MCHC: 31.5 g/dL (ref 30.0–36.0)
MCV: 90.7 fL (ref 80.0–100.0)
Monocytes Absolute: 0.8 10*3/uL (ref 0.1–1.0)
Monocytes Relative: 17 %
Neutro Abs: 2.2 10*3/uL (ref 1.7–7.7)
Neutrophils Relative %: 45 %
Platelets: 138 10*3/uL — ABNORMAL LOW (ref 150–400)
RBC: 3.78 MIL/uL — ABNORMAL LOW (ref 3.87–5.11)
RDW: 14.6 % (ref 11.5–15.5)
WBC: 4.8 10*3/uL (ref 4.0–10.5)
nRBC: 0 % (ref 0.0–0.2)

## 2021-08-28 LAB — COMPREHENSIVE METABOLIC PANEL
ALT: 33 U/L (ref 0–44)
AST: 34 U/L (ref 15–41)
Albumin: 2.7 g/dL — ABNORMAL LOW (ref 3.5–5.0)
Alkaline Phosphatase: 141 U/L — ABNORMAL HIGH (ref 38–126)
Anion gap: 7 (ref 5–15)
BUN: 31 mg/dL — ABNORMAL HIGH (ref 8–23)
CO2: 28 mmol/L (ref 22–32)
Calcium: 8.5 mg/dL — ABNORMAL LOW (ref 8.9–10.3)
Chloride: 101 mmol/L (ref 98–111)
Creatinine, Ser: 2.86 mg/dL — ABNORMAL HIGH (ref 0.44–1.00)
GFR, Estimated: 18 mL/min — ABNORMAL LOW (ref >60)
Glucose, Bld: 147 mg/dL — ABNORMAL HIGH (ref 70–99)
Potassium: 4.6 mmol/L (ref 3.5–5.1)
Sodium: 136 mmol/L (ref 135–145)
Total Bilirubin: 0.5 mg/dL (ref 0.3–1.2)
Total Protein: 6.9 g/dL (ref 6.5–8.1)

## 2021-08-28 LAB — GLUCOSE, CAPILLARY
Glucose-Capillary: 121 mg/dL — ABNORMAL HIGH (ref 70–99)
Glucose-Capillary: 165 mg/dL — ABNORMAL HIGH (ref 70–99)
Glucose-Capillary: 129 mg/dL — ABNORMAL HIGH (ref 70–99)
Glucose-Capillary: 132 mg/dL — ABNORMAL HIGH (ref 70–99)

## 2021-08-28 LAB — BRAIN NATRIURETIC PEPTIDE: B Natriuretic Peptide: 36 pg/mL (ref 0.0–100.0)

## 2021-08-28 LAB — MAGNESIUM: Magnesium: 2.8 mg/dL — ABNORMAL HIGH (ref 1.7–2.4)

## 2021-08-28 LAB — C-REACTIVE PROTEIN: CRP: 1.1 mg/dL — ABNORMAL HIGH (ref <1.0)

## 2021-08-28 MED ORDER — AMIODARONE HCL 200 MG PO TABS
200.0000 mg | ORAL_TABLET | Freq: Two times a day (BID) | ORAL | Status: DC
  Administered 2021-08-28 – 2021-09-04 (×15): 200 mg via ORAL
  Filled 2021-08-28 (×15): qty 1

## 2021-08-28 MED ORDER — ORAL CARE MOUTH RINSE
15.0000 mL | Freq: Two times a day (BID) | OROMUCOSAL | Status: DC
  Administered 2021-08-28 – 2021-09-04 (×12): 15 mL via OROMUCOSAL

## 2021-08-28 NOTE — Social Work (Signed)
CSW acknowledges orders for PT. The patient will require PT/OT evaluations. TOC will assist with disposition planning once the evaluations have been completed.    TOC will continue to follow.

## 2021-08-28 NOTE — Progress Notes (Signed)
PROGRESS NOTE                                                                                                                                                                                                             Patient Demographics:    [name], is a 66 y.o. female, DOB - 12/16/1955, KYH:[id number]  Outpatient Primary MD for the patient is [name], NP    LOS - 1  Admit date - 08/26/2021    Chief Complaint  Patient presents with   Chest Pain   Nausea   Emesis   Diarrhea   Leg Swelling       Brief Narrative (HPI from H&P)   [name] is a 66 y.o. female with medical history significant of PAF on  eliquis with hx of multiple cardioversion's, severe MR/TR,  T5VV, chronic diastolic/systolic CHF, CKD stage IV, severe MR s/p mitral valve replacement in 12/2020, OSA, COPD who presented to ED with chest pain and shortness of breath x 3 days, in the ER she was found to be in A. fib RVR along with acute on chronic systolic CHF and admitted to the hospital.  Her work-up further showed she also had incidental COVID-19 infection.   Subjective:   Patient in bed, appears comfortable, denies any headache, no fever, mild somewhat chronic substernal pressure, mild shortness of breath , no abdominal pain. No new focal weakness.    Assessment  & Plan :     Acute Hypoxic Resp. Failure due to Acute on chronic systolic heart failure EF around 20%. - she has massive fluid overload and she is currently on IV Lasix along with beta-blocker, have added Aldactone, continue supplemental oxygen and monitor.  Cardiology on board.  2.  Incidental COVID-19 infection.  She is partially vaccinated for COVID-19 we will loosely monitor inflammatory markers are clinically.  3.  Paroxysmal A. fib in RVR this admission.  Mali vas 2 score of greater than 5.  She has had difficult to control rhythm and rate in the past has  had multiple cardioversions, was initially on IV amiodarone drip now switched to oral, continue on oral beta-blocker and Eliquis.  Cardiology on board, seems to be now in sinus rhythm on 08/28/2021.  4.  Severe MR s/p repair/severe TR.  Supportive care.  Recent echo noted below.  5.  COPD.  At baseline no wheezing continue supportive care.  6.  Obesity with OSA.  BMI of 46 follow with PCP for weight loss, nighttime CPAP.  Noncompliant.  Counseled.  She refused CPAP night of 08/27/2021 and counseled again.  7.  CKD 3B.  Baseline creatinine around 3.  Monitor with diuresis.  8.  Hypokalemia.  Replaced.  9.  Dyslipidemia.  On Crestor.  10.  GERD.  PPI.  11.  DM type II.  SSI  Lab Results  Component Value Date   HGBA1C 6.5 (H) 08/27/2021    CBG (last 3)  Recent Labs    08/27/21 1532 08/27/21 2123 08/28/21 0625  GLUCAP 175* 125* 121*           Condition - Extremely Guarded  Family Communication  :  None present  Code Status :  Full  Consults  :  Cards  PUD Prophylaxis : PPI   Procedures  :      TTE 07/29/21 -   1. Left ventricular ejection fraction, by estimation, is 40 to 45%. The left ventricle has mildly decreased function. The left ventricle  demonstrates global hypokinesis. There is mild left ventricular hypertrophy. Left ventricular diastolic parameters are indeterminate.   2. Right ventricular systolic function is mildly reduced. The right ventricular size is normal. There is normal pulmonary artery systolic pressure.   3. Left atrial size was moderately dilated.   4. Right atrial size was mildly dilated.   5. HR 90 bpm. MV peak gradient, 14.1 mmHg. The mean mitral valve gradient is 6.5 mmHg . There is a 30 mm prosthetic annuloplasty ring present in the mitral position.   6. S/p repair with an annuloplasty ring.   7. The aortic valve is normal in structure. Aortic valve regurgitation is not visualized. No aortic stenosis is present.   8. The inferior vena cava  is normal in size with greater than 50% respiratory variability, suggesting right atrial pressure of 3 mmHg.      Disposition Plan  :    Status is: Observation  DVT Prophylaxis  :    apixaban (ELIQUIS) tablet 5 mg     Lab Results  Component Value Date   PLT 138 (L) 08/28/2021    Diet :  Diet Order             Diet heart healthy/carb modified Room service appropriate? Yes; Fluid consistency: Thin  Diet effective now                    Inpatient Medications  Scheduled Meds:  amiodarone  200 mg Oral BID   apixaban  5 mg Oral BID   busPIRone  5 mg Oral BID   colchicine  0.3 mg Oral Daily   cycloSPORINE  1 drop Both Eyes BID   docusate sodium  100 mg Oral BID   DULoxetine  60 mg Oral Daily   fluticasone  2 spray Each Nare Daily   furosemide  80 mg Intravenous BID   insulin aspart  0-5 Units Subcutaneous QHS   insulin aspart  0-9 Units Subcutaneous TID WC   loratadine  10 mg Oral Daily   metoprolol succinate  37.5 mg Oral Daily   multivitamin with minerals  1 tablet Oral Daily   pantoprazole  40 mg Oral Daily   potassium chloride  40 mEq Oral BID   rosuvastatin  10 mg Oral QPM   sodium chloride flush  3 mL Intravenous Q12H   spironolactone  25 mg Oral Daily   Continuous Infusions:  sodium chloride 250 mL (08/27/21 2138)   PRN Meds:.sodium chloride, acetaminophen **OR** acetaminophen, albuterol, ALPRAZolam, guaiFENesin-dextromethorphan, nitroGLYCERIN, sodium chloride flush  Antibiotics  :    Anti-infectives (From admission, onward)    None        Time Spent in minutes  30   Lala Lund M.D on 08/28/2021 at 10:19 AM  To page go to [email or web address]   [facility] -  Office  [phone]  See all Orders from today for further details    Objective:   Vitals:   08/28/21 0343 08/28/21 0627 08/28/21 0800 08/28/21 0844  BP: 125/67   132/69  Pulse: 76   80  Resp: 18  19   Temp: 98.4 F (36.9 C)   98.2 F (36.8 C)  TempSrc: Oral   Oral   SpO2: 97%   98%  Weight:  127.9 kg    Height:        Wt Readings from Last 3 Encounters:  08/28/21 127.9 kg  08/18/21 127.5 kg  08/11/21 127.5 kg     Intake/Output Summary (Last 24 hours) at 08/28/2021 1019 Last data filed at 08/28/2021 8242 Gross per 24 hour  Intake 220.36 ml  Output 1050 ml  Net -829.64 ml     Physical Exam  Awake Alert, No new F.N deficits, Normal affect [place].AT,PERRAL Supple Neck, No JVD,   Symmetrical Chest wall movement, Good air movement bilaterally, CTAB RRR,No Gallops, Rubs or new Murmurs,  +ve B.Sounds, Abd Soft, No tenderness,   2+ edema     Data Review:    CBC Recent Labs  Lab 08/26/21 1215 08/27/21 0232 08/28/21 0113  WBC 4.5 3.9* 4.8  HGB 12.1 11.4* 10.8*  HCT 38.1 35.6* 34.3*  PLT 159 133* 138*  MCV 89.9 90.8 90.7  MCH 28.5 29.1 28.6  MCHC 31.8 32.0 31.5  RDW 14.4 14.6 14.6  LYMPHSABS  --   --  1.6  MONOABS  --   --  0.8  EOSABS  --   --  0.2  BASOSABS  --   --  0.0    Electrolytes Recent Labs  Lab 08/26/21 1215 08/26/21 1416 08/27/21 0232 08/27/21 1304 08/28/21 0113  NA 138  --  139  --  136  K 3.9  --  3.2* 4.4 4.6  CL 102  --  101  --  101  CO2 27  --  28  --  28  GLUCOSE 162*  --  137*  --  147*  BUN 26*  --  27*  --  31*  CREATININE 2.61*  --  2.62*  --  2.86*  CALCIUM 9.0  --  8.5*  --  8.5*  AST 41  --   --   --  34  ALT 41  --   --   --  33  ALKPHOS 175*  --   --   --  141*  BILITOT 0.5  --   --   --  0.5  ALBUMIN 3.2*  --   --   --  2.7*  MG  --  2.1  --   --  2.8*  CRP  --   --   --  1.2* 1.1*  HGBA1C  --   --   --  6.5*  --   BNP  --  170.2*  --  31.4 36.0    ------------------------------------------------------------------------------------------------------------------ No results for input(s): CHOL, HDL, LDLCALC, TRIG, CHOLHDL, LDLDIRECT in  the last 72 hours.  Lab Results  Component Value Date   HGBA1C 6.5 (H) 08/27/2021    No results for input(s): TSH, T4TOTAL, T3FREE, THYROIDAB in the  last 72 hours.  Invalid input(s): FREET3 ------------------------------------------------------------------------------------------------------------------ ID Labs Recent Labs  Lab 08/26/21 1215 08/27/21 0232 08/27/21 1304 08/28/21 0113  WBC 4.5 3.9*  --  4.8  PLT 159 133*  --  138*  CRP  --   --  1.2* 1.1*  CREATININE 2.61* 2.62*  --  2.86*   Cardiac Enzymes No results for input(s): CKMB, TROPONINI, MYOGLOBIN in the last 168 hours.  Invalid input(s): CK  Radiology Reports DG Chest 2 View  Result Date: 08/26/2021 CLINICAL DATA:  cp EXAM: CHEST - 2 VIEW COMPARISON:  December 2022 FINDINGS: Cardiomegaly. Left atrial appendage clip present. Median sternotomy wires. No pleural effusion. No pneumothorax. No overt edema or lobar consolidation. IMPRESSION: Cardiomegaly.  No overt edema or effusion. Electronically Signed   By: Albin Felling M.D.   On: 08/26/2021 13:15

## 2021-08-28 NOTE — Progress Notes (Signed)
Pt continues  to decline CPAP

## 2021-08-28 NOTE — Progress Notes (Signed)
Progress Note  Patient Name: [name] Date of Encounter: 08/28/2021  [facility] Cardiologist: Buford Dresser, MD   Subjective   Did not sleep well. Continues to have sinus congestion. Remains in sinus rhythm. Diuresing well, still with shortness of breath.  Inpatient Medications    Scheduled Meds:  amiodarone  200 mg Oral BID   apixaban  5 mg Oral BID   busPIRone  5 mg Oral BID   colchicine  0.3 mg Oral Daily   cycloSPORINE  1 drop Both Eyes BID   docusate sodium  100 mg Oral BID   DULoxetine  60 mg Oral Daily   fluticasone  2 spray Each Nare Daily   furosemide  80 mg Intravenous BID   insulin aspart  0-5 Units Subcutaneous QHS   insulin aspart  0-9 Units Subcutaneous TID WC   loratadine  10 mg Oral Daily   metoprolol succinate  37.5 mg Oral Daily   multivitamin with minerals  1 tablet Oral Daily   pantoprazole  40 mg Oral Daily   potassium chloride  40 mEq Oral BID   rosuvastatin  10 mg Oral QPM   sodium chloride flush  3 mL Intravenous Q12H   spironolactone  25 mg Oral Daily   Continuous Infusions:  sodium chloride 250 mL (08/27/21 2138)   PRN Meds: sodium chloride, acetaminophen **OR** acetaminophen, albuterol, ALPRAZolam, guaiFENesin-dextromethorphan, nitroGLYCERIN, sodium chloride flush   Vital Signs    Vitals:   08/28/21 0343 08/28/21 0627 08/28/21 0800 08/28/21 0844  BP: 125/67   132/69  Pulse: 76   80  Resp: 18  19   Temp: 98.4 F (36.9 C)   98.2 F (36.8 C)  TempSrc: Oral   Oral  SpO2: 97%   98%  Weight:  127.9 kg    Height:        Intake/Output Summary (Last 24 hours) at 08/28/2021 1219 Last data filed at 08/28/2021 6948 Gross per 24 hour  Intake --  Output 1050 ml  Net -1050 ml   Last 3 Weights 08/28/2021 08/26/2021 08/18/2021  Weight (lbs) 281 lb 15.5 oz 298 lb 281 lb  Weight (kg) 127.9 kg 135.172 kg 127.461 kg  Some encounter information is confidential and restricted. Go to Review Flowsheets activity to see all data.       Telemetry    Aflutter, converted to sinus rhythm 08/26/21 at 19:44 - Personally Reviewed  ECG    No new since 08/26/21 - Personally Reviewed  Physical Exam   GEN: Well nourished, well developed in no acute distress. On room air NECK: No JVD CARDIAC: regular rhythm, normal S1 and S2, no rubs or gallops. No murmur. VASCULAR: Radial pulses 2+ bilaterally.  RESPIRATORY:  Somewhat distant but no rhonchi or rales ABDOMEN: Soft, non-tender, non-distended MUSCULOSKELETAL:  Moves all 4 limbs independently SKIN: Warm and dry, firm nonpitting LE edema NEUROLOGIC:  No focal neuro deficits noted. PSYCHIATRIC:  Normal affect    Labs    High Sensitivity Troponin:   Recent Labs  Lab 08/26/21 1215 08/26/21 1416  TROPONINIHS 10 10     Chemistry Recent Labs  Lab 08/26/21 1215 08/26/21 1416 08/27/21 0232 08/27/21 1304 08/28/21 0113  NA 138  --  139  --  136  K 3.9  --  3.2* 4.4 4.6  CL 102  --  101  --  101  CO2 27  --  28  --  28  GLUCOSE 162*  --  137*  --  147*  BUN 26*  --  27*  --  31*  CREATININE 2.61*  --  2.62*  --  2.86*  CALCIUM 9.0  --  8.5*  --  8.5*  MG  --  2.1  --   --  2.8*  PROT 7.8  --   --   --  6.9  ALBUMIN 3.2*  --   --   --  2.7*  AST 41  --   --   --  34  ALT 41  --   --   --  33  ALKPHOS 175*  --   --   --  141*  BILITOT 0.5  --   --   --  0.5  GFRNONAA 20*  --  20*  --  18*  ANIONGAP 9  --  10  --  7    Lipids No results for input(s): CHOL, TRIG, HDL, LABVLDL, LDLCALC, CHOLHDL in the last 168 hours.  Hematology Recent Labs  Lab 08/26/21 1215 08/27/21 0232 08/28/21 0113  WBC 4.5 3.9* 4.8  RBC 4.24 3.92 3.78*  HGB 12.1 11.4* 10.8*  HCT 38.1 35.6* 34.3*  MCV 89.9 90.8 90.7  MCH 28.5 29.1 28.6  MCHC 31.8 32.0 31.5  RDW 14.4 14.6 14.6  PLT 159 133* 138*   Thyroid No results for input(s): TSH, FREET4 in the last 168 hours.  BNP Recent Labs  Lab 08/26/21 1416 08/27/21 1304 08/28/21 0113  BNP 170.2* 31.4 36.0    DDimer No results for  input(s): DDIMER in the last 168 hours.   Radiology    DG Chest 2 View  Result Date: 08/26/2021 CLINICAL DATA:  cp EXAM: CHEST - 2 VIEW COMPARISON:  December 2022 FINDINGS: Cardiomegaly. Left atrial appendage clip present. Median sternotomy wires. No pleural effusion. No pneumothorax. No overt edema or lobar consolidation. IMPRESSION: Cardiomegaly.  No overt edema or effusion. Electronically Signed   By: Albin Felling M.D.   On: 08/26/2021 13:15    Cardiac Studies   Prior studies reviewed  Patient Profile     66 y.o. 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 She presents after having elevated heart rates since 1/4. She also notes a 20 lb weight gain since her hospital discharge on 07/31/21. Found to be Covid positive on testing.  Assessment & Plan    Covid positive History of atrial fibrillation, s/p MAZE Atypical atrial flutter with RVR -she has been on multiple agents in the past. Was last in sinus rhythm on amiodarone, but returned to flutter. -her Covid swab came back positive. She is extremely careful re: exposures but has been out (with a mask) a few times. She did have a fever at home. This could potentially be what triggered her return to flutter and her acute on chronic heart failure -she converted back to sinus 1/6. Continued IV amiodarone for ~24 hours, will change to oral amiodarone today -see note 1/6 re: potential future ablate and pace strategy. 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 Prior discharge weight on 07/31/21 was 124.8 kg, nearing her dry weight -Cr 2.86 today, continue to monitor  For questions or updates, please contact [place] Please consult [email or web address] for contact info under     Signed, Buford Dresser, MD  08/28/2021, 12:19 PM

## 2021-08-28 NOTE — Evaluation (Signed)
Physical Therapy Evaluation Patient Details Name: Jazmarie Biever MRN: [id number] DOB: [birth year] Today's Date: 08/28/2021  History of Present Illness  Pt is a 66 y.o. female admitted 08/26/21 with chest pain, SOB. Workup for hypoxic respiratory failure secondary to acute on chronic CHF, afib with RVR; incidental (+) COVID-19. PMH includes CHF, CKD IV, severe MR, OSA, COPD, asthma, DVT, OSA, DM2, afib on Eliquis.   Clinical Impression  Pt presents with an overall decrease in functional mobility secondary to above. PTA, pt mod indep with SPC, limited community ambulatory, lives with family who assist without household tasks. Today, pt moving well with RW and supervision for safety/lines; limited by c/o fatigue, dizziness and productive cough. Educ re: activity recommendations, importance of OOB, incentive spirometer use. Pt would benefit from continued acute PT services to maximize functional mobility and independence prior to d/c home.     SpO2 >/95% on RA    Recommendations for follow up therapy are one component of a multi-disciplinary discharge planning process, led by the attending physician.  Recommendations may be updated based on patient status, additional functional criteria and insurance authorization.  Follow Up Recommendations No PT follow up    Assistance Recommended at Discharge PRN  Patient can return home with the following  Assistance with cooking/housework;Assist for transportation    Equipment Recommendations None recommended by PT  Recommendations for Other Services       Functional Status Assessment Patient has had a recent decline in their functional status and demonstrates the ability to make significant improvements in function in a reasonable and predictable amount of time.     Precautions / Restrictions Precautions Precautions: Fall Restrictions Weight Bearing Restrictions: No      Mobility  Bed Mobility Overal bed mobility: Modified Independent                   Transfers Overall transfer level: Modified independent Equipment used: Rolling walker (2 wheels)                    Ambulation/Gait Ambulation/Gait assistance: Supervision Gait Distance (Feet): 60 Feet Assistive device: Rolling walker (2 wheels) Gait Pattern/deviations: Step-through pattern;Decreased stride length;Trunk flexed Gait velocity: Decreased     General Gait Details: Ambulating laps around room with RW and supervision for safety/lines; seated rest break secondary to c/o dizziness and productive cough  Stairs            Wheelchair Mobility    Modified Rankin (Stroke Patients Only)       Balance Overall balance assessment: Needs assistance   Sitting balance-Leahy Scale: Good     Standing balance support: No upper extremity supported;During functional activity Standing balance-Leahy Scale: Fair                               Pertinent Vitals/Pain Pain Assessment: Faces Faces Pain Scale: Hurts a little bit Pain Location: chest with deep breath Pain Descriptors / Indicators: Sore Pain Intervention(s): Monitored during session    Home Living Family/patient expects to be discharged to:: Private residence Living Arrangements: Children;Other relatives Available Help at Discharge: Family;Available PRN/intermittently Type of Home: Apartment Home Access: Stairs to enter Entrance Stairs-Rails: None Entrance Stairs-Number of Steps: 4 total (1 + 1 + 2 in) from parking lot, long sidewalk   Home Layout: One level Home Equipment: Cane - single point;Rollator (4 wheels);BSC/3in1;Shower seat Additional Comments: Lives with daughter and grandson; daughter works, but family able to assist without  household tasks if needed    Prior Function Prior Level of Function : Independent/Modified Independent             Mobility Comments: Mod I using SPC, denies any recent falls. Does not drive ADLs Comments: Pt mod indep with  bathing, sits/stands to shower. Daughter performs 29 of household tasks. Pt indep with med management     Hand Dominance   Dominant Hand: Right    Extremity/Trunk Assessment   Upper Extremity Assessment Upper Extremity Assessment: Overall WFL for tasks assessed    Lower Extremity Assessment Lower Extremity Assessment: Overall WFL for tasks assessed       Communication   Communication: No difficulties  Cognition Arousal/Alertness: Awake/alert Behavior During Therapy: WFL for tasks assessed/performed Overall Cognitive Status: Within Functional Limits for tasks assessed                                          General Comments General comments (skin integrity, edema, etc.): SpO2 >/95% on RA with activity; productive cough increasing once sitting upright. Educ on importance of OOB, including transfers to/from [facility] instead of using purewick during day (pt mod indep with this, RN notified)    Exercises Other Exercises Other Exercises: IS x5, pt pulling ~500-739mL (educ on frequency)   Assessment/Plan    PT Assessment Patient needs continued PT services  PT Problem List Decreased strength;Decreased activity tolerance;Decreased mobility;Cardiopulmonary status limiting activity       PT Treatment Interventions DME instruction;Gait training;Stair training;Functional mobility training;Therapeutic activities;Therapeutic exercise;Balance training;Patient/family education    PT Goals (Current goals can be found in the Care Plan section)  Acute Rehab PT Goals Patient Stated Goal: Return home PT Goal Formulation: With patient Time For Goal Achievement: 09/11/21 Potential to Achieve Goals: Good    Frequency Min 3X/week     Co-evaluation               AM-PAC PT "6 Clicks" Mobility  Outcome Measure Help needed turning from your back to your side while in a flat bed without using bedrails?: None Help needed moving from lying on your back to sitting on  the side of a flat bed without using bedrails?: None Help needed moving to and from a bed to a chair (including a wheelchair)?: None Help needed standing up from a chair using your arms (e.g., wheelchair or bedside chair)?: None Help needed to walk in hospital room?: A Little Help needed climbing 3-5 steps with a railing? : A Little 6 Click Score: 22    End of Session   Activity Tolerance: Patient tolerated treatment well Patient left: in chair;with call bell/phone within reach Nurse Communication: Mobility status PT Visit Diagnosis: Other abnormalities of gait and mobility (R26.89)    Time: 1005-1025 PT Time Calculation (min) (ACUTE ONLY): 20 min   Charges:   PT Evaluation $PT Eval Low Complexity: 1 Low     Mabeline Caras, PT, DPT Acute Rehabilitation Services  Pager [phone] Office [place] 08/28/2021, 10:55 AM

## 2021-08-29 ENCOUNTER — Other Ambulatory Visit (INDEPENDENT_AMBULATORY_CARE_PROVIDER_SITE_OTHER): Payer: Self-pay | Admitting: Primary Care

## 2021-08-29 ENCOUNTER — Inpatient Hospital Stay (HOSPITAL_COMMUNITY): Payer: 59

## 2021-08-29 DIAGNOSIS — F4323 Adjustment disorder with mixed anxiety and depressed mood: Secondary | ICD-10-CM

## 2021-08-29 DIAGNOSIS — Z9889 Other specified postprocedural states: Secondary | ICD-10-CM | POA: Diagnosis not present

## 2021-08-29 DIAGNOSIS — I4892 Unspecified atrial flutter: Secondary | ICD-10-CM | POA: Diagnosis not present

## 2021-08-29 DIAGNOSIS — I5043 Acute on chronic combined systolic (congestive) and diastolic (congestive) heart failure: Secondary | ICD-10-CM | POA: Diagnosis not present

## 2021-08-29 LAB — COMPREHENSIVE METABOLIC PANEL
ALT: 27 U/L (ref 0–44)
AST: 24 U/L (ref 15–41)
Albumin: 2.6 g/dL — ABNORMAL LOW (ref 3.5–5.0)
Alkaline Phosphatase: 149 U/L — ABNORMAL HIGH (ref 38–126)
Anion gap: 9 (ref 5–15)
BUN: 32 mg/dL — ABNORMAL HIGH (ref 8–23)
CO2: 27 mmol/L (ref 22–32)
Calcium: 8.7 mg/dL — ABNORMAL LOW (ref 8.9–10.3)
Chloride: 100 mmol/L (ref 98–111)
Creatinine, Ser: 2.75 mg/dL — ABNORMAL HIGH (ref 0.44–1.00)
GFR, Estimated: 19 mL/min — ABNORMAL LOW (ref >60)
Glucose, Bld: 107 mg/dL — ABNORMAL HIGH (ref 70–99)
Potassium: 4.5 mmol/L (ref 3.5–5.1)
Sodium: 136 mmol/L (ref 135–145)
Total Bilirubin: 0.8 mg/dL (ref 0.3–1.2)
Total Protein: 7 g/dL (ref 6.5–8.1)

## 2021-08-29 LAB — C-REACTIVE PROTEIN: CRP: 4.4 mg/dL — ABNORMAL HIGH (ref <1.0)

## 2021-08-29 LAB — GLUCOSE, CAPILLARY
Glucose-Capillary: 87 mg/dL (ref 70–99)
Glucose-Capillary: 159 mg/dL — ABNORMAL HIGH (ref 70–99)
Glucose-Capillary: 126 mg/dL — ABNORMAL HIGH (ref 70–99)
Glucose-Capillary: 143 mg/dL — ABNORMAL HIGH (ref 70–99)

## 2021-08-29 LAB — MAGNESIUM: Magnesium: 2.2 mg/dL (ref 1.7–2.4)

## 2021-08-29 LAB — CBC WITH DIFFERENTIAL/PLATELET
Abs Immature Granulocytes: 0.01 10*3/uL (ref 0.00–0.07)
Basophils Absolute: 0 10*3/uL (ref 0.0–0.1)
Basophils Relative: 0 %
Eosinophils Absolute: 0.1 10*3/uL (ref 0.0–0.5)
Eosinophils Relative: 3 %
HCT: 32.8 % — ABNORMAL LOW (ref 36.0–46.0)
Hemoglobin: 10.7 g/dL — ABNORMAL LOW (ref 12.0–15.0)
Immature Granulocytes: 0 %
Lymphocytes Relative: 31 %
Lymphs Abs: 1.4 10*3/uL (ref 0.7–4.0)
MCH: 28.9 pg (ref 26.0–34.0)
MCHC: 32.6 g/dL (ref 30.0–36.0)
MCV: 88.6 fL (ref 80.0–100.0)
Monocytes Absolute: 0.8 10*3/uL (ref 0.1–1.0)
Monocytes Relative: 18 %
Neutro Abs: 2.2 10*3/uL (ref 1.7–7.7)
Neutrophils Relative %: 48 %
Platelets: 142 10*3/uL — ABNORMAL LOW (ref 150–400)
RBC: 3.7 MIL/uL — ABNORMAL LOW (ref 3.87–5.11)
RDW: 14.5 % (ref 11.5–15.5)
WBC: 4.6 10*3/uL (ref 4.0–10.5)
nRBC: 0 % (ref 0.0–0.2)

## 2021-08-29 LAB — BRAIN NATRIURETIC PEPTIDE: B Natriuretic Peptide: 90.3 pg/mL (ref 0.0–100.0)

## 2021-08-29 MED ORDER — METOLAZONE 2.5 MG PO TABS
2.5000 mg | ORAL_TABLET | ORAL | Status: DC
  Administered 2021-08-29 – 2021-08-31 (×2): 2.5 mg via ORAL
  Filled 2021-08-29 (×3): qty 1

## 2021-08-29 MED ORDER — TORSEMIDE 20 MG PO TABS
60.0000 mg | ORAL_TABLET | Freq: Two times a day (BID) | ORAL | Status: DC
  Administered 2021-08-29 – 2021-09-01 (×7): 60 mg via ORAL
  Filled 2021-08-29 (×7): qty 3

## 2021-08-29 MED ORDER — FENTANYL CITRATE PF 50 MCG/ML IJ SOSY
PREFILLED_SYRINGE | INTRAMUSCULAR | Status: AC
  Filled 2021-08-29: qty 2

## 2021-08-29 MED ORDER — MIDAZOLAM HCL 2 MG/2ML IJ SOLN
INTRAMUSCULAR | Status: AC
  Filled 2021-08-29: qty 4

## 2021-08-29 MED ORDER — AMIODARONE HCL IN DEXTROSE 360-4.14 MG/200ML-% IV SOLN
INTRAVENOUS | Status: AC
  Filled 2021-08-29: qty 200

## 2021-08-29 NOTE — Progress Notes (Signed)
Pt refusing cpap for the night. ?

## 2021-08-29 NOTE — Progress Notes (Signed)
PROGRESS NOTE                                                                                                                                                                                                             Patient Demographics:    [name], is a 66 y.o. female, DOB - [birth year], MWN:[id number]  Outpatient Primary MD for the patient is [name], NP    LOS - 2  Admit date - 08/26/2021    Chief Complaint  Patient presents with   Chest Pain   Nausea   Emesis   Diarrhea   Leg Swelling       Brief Narrative (HPI from H&P)   [name] is a 66 y.o. female with medical history significant of PAF on  eliquis with hx of multiple cardioversion's, severe MR/TR,  Q0HK, chronic diastolic/systolic CHF, CKD stage IV, severe MR s/p mitral valve replacement in 12/2020, OSA, COPD who presented to ED with chest pain and shortness of breath x 3 days, in the ER she was found to be in A. fib RVR along with acute on chronic systolic CHF and admitted to the hospital.  Her work-up further showed she also had incidental COVID-19 infection.   Subjective:   Patient in bed, appears comfortable, denies any headache, no fever, no chest pain or pressure, improved shortness of breath , no abdominal pain. No new focal weakness.   Assessment  & Plan :     Acute Hypoxic Resp. Failure due to Acute on chronic systolic heart failure EF around 20%. - she has massive fluid overload and she is currently on IV Lasix along with beta-blocker, have added Aldactone, continue supplemental oxygen and monitor.  Cardiology on board.  2.  Incidental COVID-19 infection.  She is partially vaccinated for COVID-19 we will closely monitor inflammatory markers are clinically.  CRP noted, repeat chest x-ray.  Recent Labs  Lab 08/26/21 1215 08/27/21 0232 08/27/21 1304 08/28/21 0113 08/29/21 0642  WBC 4.5 3.9*  --  4.8 4.6  PLT 159  133*  --  138* 142*  CRP  --   --  1.2* 1.1* 4.4*  CREATININE 2.61* 2.62*  --  2.86* 2.75*     3.  Paroxysmal A. fib in RVR this admission.  Mali vas 2 score of greater than 5.  She has  had difficult to control rhythm and rate in the past has had multiple cardioversions, was initially on IV amiodarone drip now switched to oral, continue on oral beta-blocker and Eliquis.  Cardiology on board, seems to be now in sinus rhythm on 08/28/2021.  4.  Severe MR s/p repair/severe TR.  Supportive care.  Recent echo noted below.  5.  COPD.  At baseline no wheezing continue supportive care.  6.  Obesity with OSA.  BMI of 46 follow with PCP for weight loss, nighttime CPAP.  Noncompliant.  Counseled.  She refused CPAP night of 08/27/2021 and counseled again.  7.  CKD 3B.  Baseline creatinine around 3.  Monitor with diuresis.  8.  Hypokalemia.  Replaced.  9.  Dyslipidemia.  On Crestor.  10.  GERD.  PPI.  11.  DM type II.  SSI  Lab Results  Component Value Date   HGBA1C 6.5 (H) 08/27/2021    CBG (last 3)  Recent Labs    08/28/21 1547 08/28/21 2236 08/29/21 0650  GLUCAP 129* 132* 87           Condition - Extremely Guarded  Family Communication  :  None present  Code Status :  Full  Consults  :  Cards  PUD Prophylaxis : PPI   Procedures  :      TTE 07/29/21 -   1. Left ventricular ejection fraction, by estimation, is 40 to 45%. The left ventricle has mildly decreased function. The left ventricle  demonstrates global hypokinesis. There is mild left ventricular hypertrophy. Left ventricular diastolic parameters are indeterminate.   2. Right ventricular systolic function is mildly reduced. The right ventricular size is normal. There is normal pulmonary artery systolic pressure.   3. Left atrial size was moderately dilated.   4. Right atrial size was mildly dilated.   5. HR 90 bpm. MV peak gradient, 14.1 mmHg. The mean mitral valve gradient is 6.5 mmHg . There is a 30 mm prosthetic  annuloplasty ring present in the mitral position.   6. S/p repair with an annuloplasty ring.   7. The aortic valve is normal in structure. Aortic valve regurgitation is not visualized. No aortic stenosis is present.   8. The inferior vena cava is normal in size with greater than 50% respiratory variability, suggesting right atrial pressure of 3 mmHg.      Disposition Plan  :    Status is: Observation  DVT Prophylaxis  :    apixaban (ELIQUIS) tablet 5 mg     Lab Results  Component Value Date   PLT 142 (L) 08/29/2021    Diet :  Diet Order             Diet heart healthy/carb modified Room service appropriate? Yes; Fluid consistency: Thin  Diet effective now                    Inpatient Medications  Scheduled Meds:  amiodarone  200 mg Oral BID   apixaban  5 mg Oral BID   busPIRone  5 mg Oral BID   colchicine  0.3 mg Oral Daily   cycloSPORINE  1 drop Both Eyes BID   docusate sodium  100 mg Oral BID   DULoxetine  60 mg Oral Daily   fluticasone  2 spray Each Nare Daily   furosemide  80 mg Intravenous BID   insulin aspart  0-5 Units Subcutaneous QHS   insulin aspart  0-9 Units Subcutaneous TID WC   loratadine  10 mg Oral Daily   mouth rinse  15 mL Mouth Rinse BID   metoprolol succinate  37.5 mg Oral Daily   multivitamin with minerals  1 tablet Oral Daily   pantoprazole  40 mg Oral Daily   potassium chloride  40 mEq Oral BID   rosuvastatin  10 mg Oral QPM   sodium chloride flush  3 mL Intravenous Q12H   spironolactone  25 mg Oral Daily   Continuous Infusions:  sodium chloride 250 mL (08/27/21 2138)   PRN Meds:.sodium chloride, acetaminophen **OR** acetaminophen, albuterol, ALPRAZolam, guaiFENesin-dextromethorphan, nitroGLYCERIN, sodium chloride flush  Antibiotics  :    Anti-infectives (From admission, onward)    None        Time Spent in minutes  30   Lala Lund M.D on 08/29/2021 at 10:05 AM  To page go to [email or web address]   [facility] -   Office  [phone]  See all Orders from today for further details    Objective:   Vitals:   08/28/21 0844 08/28/21 1600 08/28/21 2238 08/29/21 0817  BP: 132/69 132/72 118/69 (!) 114/58  Pulse: 80  80 79  Resp:  (!) 22 19 (!) 21  Temp: 98.2 F (36.8 C) 98.1 F (36.7 C) 97.9 F (36.6 C) 99.2 F (37.3 C)  TempSrc: Oral Oral Oral Oral  SpO2: 98%  95%   Weight:      Height:        Wt Readings from Last 3 Encounters:  08/28/21 127.9 kg  08/18/21 127.5 kg  08/11/21 127.5 kg     Intake/Output Summary (Last 24 hours) at 08/29/2021 1005 Last data filed at 08/29/2021 0400 Gross per 24 hour  Intake 0 ml  Output 900 ml  Net -900 ml     Physical Exam  Awake Alert, No new F.N deficits, Normal affect [place].AT,PERRAL Supple Neck, No JVD,   Symmetrical Chest wall movement, Good air movement bilaterally, few crackles RRR,No Gallops, Rubs or new Murmurs,  +ve B.Sounds, Abd Soft, No tenderness,   2+edema     Data Review:    CBC Recent Labs  Lab 08/26/21 1215 08/27/21 0232 08/28/21 0113 08/29/21 0642  WBC 4.5 3.9* 4.8 4.6  HGB 12.1 11.4* 10.8* 10.7*  HCT 38.1 35.6* 34.3* 32.8*  PLT 159 133* 138* 142*  MCV 89.9 90.8 90.7 88.6  MCH 28.5 29.1 28.6 28.9  MCHC 31.8 32.0 31.5 32.6  RDW 14.4 14.6 14.6 14.5  LYMPHSABS  --   --  1.6 1.4  MONOABS  --   --  0.8 0.8  EOSABS  --   --  0.2 0.1  BASOSABS  --   --  0.0 0.0    Electrolytes Recent Labs  Lab 08/26/21 1215 08/26/21 1416 08/27/21 0232 08/27/21 1304 08/28/21 0113 08/29/21 0642  NA 138  --  139  --  136 136  K 3.9  --  3.2* 4.4 4.6 4.5  CL 102  --  101  --  101 100  CO2 27  --  28  --  28 27  GLUCOSE 162*  --  137*  --  147* 107*  BUN 26*  --  27*  --  31* 32*  CREATININE 2.61*  --  2.62*  --  2.86* 2.75*  CALCIUM 9.0  --  8.5*  --  8.5* 8.7*  AST 41  --   --   --  34 24  ALT 41  --   --   --  33 27  ALKPHOS 175*  --   --   --  141* 149*  BILITOT 0.5  --   --   --  0.5 0.8  ALBUMIN 3.2*  --   --   --   2.7* 2.6*  MG  --  2.1  --   --  2.8* 2.2  CRP  --   --   --  1.2* 1.1* 4.4*  HGBA1C  --   --   --  6.5*  --   --   BNP  --  170.2*  --  31.4 36.0 90.3    ------------------------------------------------------------------------------------------------------------------ No results for input(s): CHOL, HDL, LDLCALC, TRIG, CHOLHDL, LDLDIRECT in the last 72 hours.  Lab Results  Component Value Date   HGBA1C 6.5 (H) 08/27/2021    No results for input(s): TSH, T4TOTAL, T3FREE, THYROIDAB in the last 72 hours.  Invalid input(s): FREET3 ------------------------------------------------------------------------------------------------------------------ ID Labs Recent Labs  Lab 08/26/21 1215 08/27/21 0232 08/27/21 1304 08/28/21 0113 08/29/21 0642  WBC 4.5 3.9*  --  4.8 4.6  PLT 159 133*  --  138* 142*  CRP  --   --  1.2* 1.1* 4.4*  CREATININE 2.61* 2.62*  --  2.86* 2.75*   Cardiac Enzymes No results for input(s): CKMB, TROPONINI, MYOGLOBIN in the last 168 hours.  Invalid input(s): CK  Radiology Reports DG Chest 2 View  Result Date: 08/26/2021 CLINICAL DATA:  cp EXAM: CHEST - 2 VIEW COMPARISON:  December 2022 FINDINGS: Cardiomegaly. Left atrial appendage clip present. Median sternotomy wires. No pleural effusion. No pneumothorax. No overt edema or lobar consolidation. IMPRESSION: Cardiomegaly.  No overt edema or effusion. Electronically Signed   By: Albin Felling M.D.   On: 08/26/2021 13:15

## 2021-08-29 NOTE — TOC Progression Note (Signed)
Transition of Care [facility]) - Progression Note    Patient Details  Name: [name] MRN: [id number] Date of Birth: 1955/10/04  Transition of [facility]) CM/SW [place], RN Phone Number:[phone]  08/29/2021, 1:56 PM  Clinical Narrative:    TOC continues to follow. Currently there are no PT recommendations per PT evaluation.         Expected Discharge Plan and Services                                                 Social Determinants of Health (SDOH) Interventions    Readmission Risk Interventions Readmission Risk Prevention Plan 02/10/2021 01/11/2021 03/08/2020  Transportation Screening Complete Complete Complete  Medication Review Press photographer) Complete - Complete  PCP or Specialist appointment within 3-5 days of discharge Complete Complete Complete  HRI or Home Care Consult Complete Complete Complete  SW Recovery Care/Counseling Consult Complete Complete Complete  Palliative Care Screening Not Applicable Not Applicable Not [place] Not Applicable Not Applicable Not Applicable  Some recent data might be hidden

## 2021-08-29 NOTE — Progress Notes (Signed)
Progress Note  Patient Name: [name] Date of Encounter: 08/29/2021  [facility] Cardiologist: Buford Dresser, MD   Subjective   Some coughing overall improved   Inpatient Medications    Scheduled Meds:  amiodarone  200 mg Oral BID   apixaban  5 mg Oral BID   busPIRone  5 mg Oral BID   colchicine  0.3 mg Oral Daily   cycloSPORINE  1 drop Both Eyes BID   docusate sodium  100 mg Oral BID   DULoxetine  60 mg Oral Daily   fluticasone  2 spray Each Nare Daily   furosemide  80 mg Intravenous BID   insulin aspart  0-5 Units Subcutaneous QHS   insulin aspart  0-9 Units Subcutaneous TID WC   loratadine  10 mg Oral Daily   mouth rinse  15 mL Mouth Rinse BID   metoprolol succinate  37.5 mg Oral Daily   multivitamin with minerals  1 tablet Oral Daily   pantoprazole  40 mg Oral Daily   potassium chloride  40 mEq Oral BID   rosuvastatin  10 mg Oral QPM   sodium chloride flush  3 mL Intravenous Q12H   spironolactone  25 mg Oral Daily   Continuous Infusions:  sodium chloride 250 mL (08/27/21 2138)   PRN Meds: sodium chloride, acetaminophen **OR** acetaminophen, albuterol, ALPRAZolam, guaiFENesin-dextromethorphan, nitroGLYCERIN, sodium chloride flush   Vital Signs    Vitals:   08/28/21 0844 08/28/21 1600 08/28/21 2238 08/29/21 0817  BP: 132/69 132/72 118/69 (!) 114/58  Pulse: 80  80 79  Resp:  (!) 22 19 (!) 21  Temp: 98.2 F (36.8 C) 98.1 F (36.7 C) 97.9 F (36.6 C) 99.2 F (37.3 C)  TempSrc: Oral Oral Oral Oral  SpO2: 98%  95%   Weight:      Height:        Intake/Output Summary (Last 24 hours) at 08/29/2021 0959 Last data filed at 08/29/2021 0400 Gross per 24 hour  Intake 0 ml  Output 900 ml  Net -900 ml   Last 3 Weights 08/28/2021 08/26/2021 08/18/2021  Weight (lbs) 281 lb 15.5 oz 298 lb 281 lb  Weight (kg) 127.9 kg 135.172 kg 127.461 kg  Some encounter information is confidential and restricted. Go to Review Flowsheets activity to see all data.       Telemetry    Aflutter, converted to sinus rhythm 08/26/21 at 19:44 - Personally Reviewed  ECG    No new since 08/26/21 - Personally Reviewed  Physical Exam   Overweight black female Lungs atelectasis base No murmur  Plus one bilateral edema  Palpable pedal pulses   Labs    High Sensitivity Troponin:   Recent Labs  Lab 08/26/21 1215 08/26/21 1416  TROPONINIHS 10 10     Chemistry Recent Labs  Lab 08/26/21 1215 08/26/21 1416 08/27/21 0232 08/27/21 1304 08/28/21 0113 08/29/21 0642  NA 138  --  139  --  136 136  K 3.9  --  3.2* 4.4 4.6 4.5  CL 102  --  101  --  101 100  CO2 27  --  28  --  28 27  GLUCOSE 162*  --  137*  --  147* 107*  BUN 26*  --  27*  --  31* 32*  CREATININE 2.61*  --  2.62*  --  2.86* 2.75*  CALCIUM 9.0  --  8.5*  --  8.5* 8.7*  MG  --  2.1  --   --  2.8* 2.2  PROT 7.8  --   --   --  6.9 7.0  ALBUMIN 3.2*  --   --   --  2.7* 2.6*  AST 41  --   --   --  34 24  ALT 41  --   --   --  33 27  ALKPHOS 175*  --   --   --  141* 149*  BILITOT 0.5  --   --   --  0.5 0.8  GFRNONAA 20*  --  20*  --  18* 19*  ANIONGAP 9  --  10  --  7 9    Lipids No results for input(s): CHOL, TRIG, HDL, LABVLDL, LDLCALC, CHOLHDL in the last 168 hours.  Hematology Recent Labs  Lab 08/27/21 0232 08/28/21 0113 08/29/21 0642  WBC 3.9* 4.8 4.6  RBC 3.92 3.78* 3.70*  HGB 11.4* 10.8* 10.7*  HCT 35.6* 34.3* 32.8*  MCV 90.8 90.7 88.6  MCH 29.1 28.6 28.9  MCHC 32.0 31.5 32.6  RDW 14.6 14.6 14.5  PLT 133* 138* 142*   Thyroid No results for input(s): TSH, FREET4 in the last 168 hours.  BNP Recent Labs  Lab 08/27/21 1304 08/28/21 0113 08/29/21 0642  BNP 31.4 36.0 90.3    DDimer No results for input(s): DDIMER in the last 168 hours.   Radiology    No results found.  Cardiac Studies   Prior studies reviewed  Patient Profile     66 y.o. female with complex cardiac history including severe MR/TR s/p repair, chronic combined systolic and diastolic heart  failure, atrial fibrillation s/p MAZE with recurrence, atypical atrial flutter, frequent hospitalizations, chronic kidney disease stage 4. She presents after having elevated heart rates since 1/4. She also notes a 20 lb weight gain since her hospital discharge on 07/31/21. Found to be Covid positive on testing.  Assessment & Plan     Flutter:  history of MAZE now on oral amiodarone and maintaining NSR   CHF:  acute/chronic systolic EF 92-11% Concern for being on aldactone / K given renal failure Home on demedex and zaroxyln Continue iv lasix. D/c aldactone and K. Add oral zaroxyln and transition to oral lasix in am Right heart cath 07/29/21 showed PCWP only 13 mmHg with PA 40/11 mmHg  Labs today BNP normal 90   MR/TR:  post repair with 30 mm annuloplasty ring normal function on TTE 07/29/21   CRF:  Cr 2.6 at baseline K 4.5 see above regarding concern for being on K/aldactone   Covid positive:  per primary service    For questions or updates, please contact [facility] Please consult [email or web address] for contact info under     Signed, Jenkins Rouge, MD  08/29/2021, 9:59 AM

## 2021-08-30 DIAGNOSIS — I5043 Acute on chronic combined systolic (congestive) and diastolic (congestive) heart failure: Secondary | ICD-10-CM | POA: Diagnosis not present

## 2021-08-30 DIAGNOSIS — I4892 Unspecified atrial flutter: Secondary | ICD-10-CM | POA: Diagnosis not present

## 2021-08-30 LAB — CBC WITH DIFFERENTIAL/PLATELET
Abs Immature Granulocytes: 0.01 10*3/uL (ref 0.00–0.07)
Basophils Absolute: 0 10*3/uL (ref 0.0–0.1)
Basophils Relative: 0 %
Eosinophils Absolute: 0.1 10*3/uL (ref 0.0–0.5)
Eosinophils Relative: 3 %
HCT: 33.2 % — ABNORMAL LOW (ref 36.0–46.0)
Hemoglobin: 10.8 g/dL — ABNORMAL LOW (ref 12.0–15.0)
Immature Granulocytes: 0 %
Lymphocytes Relative: 43 %
Lymphs Abs: 1.7 10*3/uL (ref 0.7–4.0)
MCH: 28.4 pg (ref 26.0–34.0)
MCHC: 32.5 g/dL (ref 30.0–36.0)
MCV: 87.4 fL (ref 80.0–100.0)
Monocytes Absolute: 0.8 10*3/uL (ref 0.1–1.0)
Monocytes Relative: 19 %
Neutro Abs: 1.4 10*3/uL — ABNORMAL LOW (ref 1.7–7.7)
Neutrophils Relative %: 35 %
Platelets: 150 10*3/uL (ref 150–400)
RBC: 3.8 MIL/uL — ABNORMAL LOW (ref 3.87–5.11)
RDW: 14.2 % (ref 11.5–15.5)
WBC: 4.1 10*3/uL (ref 4.0–10.5)
nRBC: 0 % (ref 0.0–0.2)

## 2021-08-30 LAB — COMPREHENSIVE METABOLIC PANEL
ALT: 24 U/L (ref 0–44)
AST: 23 U/L (ref 15–41)
Albumin: 2.7 g/dL — ABNORMAL LOW (ref 3.5–5.0)
Alkaline Phosphatase: 156 U/L — ABNORMAL HIGH (ref 38–126)
Anion gap: 10 (ref 5–15)
BUN: 35 mg/dL — ABNORMAL HIGH (ref 8–23)
CO2: 29 mmol/L (ref 22–32)
Calcium: 8.5 mg/dL — ABNORMAL LOW (ref 8.9–10.3)
Chloride: 98 mmol/L (ref 98–111)
Creatinine, Ser: 2.66 mg/dL — ABNORMAL HIGH (ref 0.44–1.00)
GFR, Estimated: 19 mL/min — ABNORMAL LOW (ref >60)
Glucose, Bld: 99 mg/dL (ref 70–99)
Potassium: 3.5 mmol/L (ref 3.5–5.1)
Sodium: 137 mmol/L (ref 135–145)
Total Bilirubin: 0.6 mg/dL (ref 0.3–1.2)
Total Protein: 7.2 g/dL (ref 6.5–8.1)

## 2021-08-30 LAB — C-REACTIVE PROTEIN: CRP: 7 mg/dL — ABNORMAL HIGH (ref <1.0)

## 2021-08-30 LAB — GLUCOSE, CAPILLARY
Glucose-Capillary: 116 mg/dL — ABNORMAL HIGH (ref 70–99)
Glucose-Capillary: 178 mg/dL — ABNORMAL HIGH (ref 70–99)
Glucose-Capillary: 176 mg/dL — ABNORMAL HIGH (ref 70–99)
Glucose-Capillary: 312 mg/dL — ABNORMAL HIGH (ref 70–99)

## 2021-08-30 LAB — BRAIN NATRIURETIC PEPTIDE: B Natriuretic Peptide: 88.1 pg/mL (ref 0.0–100.0)

## 2021-08-30 LAB — MAGNESIUM: Magnesium: 2.1 mg/dL (ref 1.7–2.4)

## 2021-08-30 MED ORDER — MOLNUPIRAVIR EUA 200MG CAPSULE
4.0000 | ORAL_CAPSULE | Freq: Two times a day (BID) | ORAL | Status: AC
  Administered 2021-08-30 – 2021-09-03 (×10): 800 mg via ORAL
  Filled 2021-08-30: qty 4

## 2021-08-30 MED ORDER — POTASSIUM CHLORIDE CRYS ER 20 MEQ PO TBCR
40.0000 meq | EXTENDED_RELEASE_TABLET | Freq: Two times a day (BID) | ORAL | Status: DC
  Administered 2021-08-30 (×2): 40 meq via ORAL
  Filled 2021-08-30 (×2): qty 2

## 2021-08-30 MED ORDER — DEXAMETHASONE SODIUM PHOSPHATE 10 MG/ML IJ SOLN
6.0000 mg | INTRAMUSCULAR | Status: DC
  Administered 2021-08-30 – 2021-09-04 (×6): 6 mg via INTRAVENOUS
  Filled 2021-08-30 (×6): qty 0.6

## 2021-08-30 NOTE — Progress Notes (Signed)
PROGRESS NOTE                                                                                                                                                                                                             Patient Demographics:    [name], is a 65 y.o. female, DOB - [birth year], BJS:[id number]  Outpatient Primary MD for the patient is [name], NP    LOS - 3  Admit date - 08/26/2021    Chief Complaint  Patient presents with   Chest Pain   Nausea   Emesis   Diarrhea   Leg Swelling       Brief Narrative (HPI from H&P)   [name] is a 66 y.o. female with medical history significant of PAF on  eliquis with hx of multiple cardioversion's, severe MR/TR,  Y0VP, chronic diastolic/systolic CHF, CKD stage IV, severe MR s/p mitral valve replacement in 12/2020, OSA, COPD who presented to ED with chest pain and shortness of breath x 3 days, in the ER she was found to be in A. fib RVR along with acute on chronic systolic CHF and admitted to the hospital.  Her work-up further showed she also had incidental COVID-19 infection.   Subjective:   Patient in bed, appears comfortable, denies any headache, no fever, no chest pain or pressure, improved shortness of breath , no abdominal pain. No new focal weakness.   Assessment  & Plan :     Acute Hypoxic Resp. Failure due to Acute on chronic systolic heart failure EF 45% improved from 20% before. - she has massive fluid overload and she is currently on IV Lasix along with beta-blocker, has diuresed well with improvement in symptom, on room air now.  Cardiology on board.  Aldactone is being avoided due to CKD.  2.  Incidental COVID-19 infection.  She is partially vaccinated for COVID-19 , CRP gradually rising with slight worsening of chest x-ray although no symptoms we will give her a trial of Decadron along with Molnupiravir on 08/30/2021 and monitor  closely, no hypoxia currently.  Recent Labs  Lab 08/26/21 1215 08/27/21 0232 08/27/21 1304 08/28/21 0113 08/29/21 0642 08/30/21 0531  WBC 4.5 3.9*  --  4.8 4.6 4.1  PLT 159 133*  --  138* 142* 150  CRP  --   --  1.2*  1.1* 4.4* 7.0*  CREATININE 2.61* 2.62*  --  2.86* 2.75* 2.66*     3.  Paroxysmal A. fib in RVR this admission.  Mali vas 2 score of greater than 5.  She has had difficult to control rhythm and rate in the past has had multiple cardioversions, was initially on IV amiodarone drip now switched to oral, continue on oral beta-blocker and Eliquis.  Cardiology on board, seems to be now in sinus rhythm on 08/28/2021.  4.  Severe MR s/p repair/severe TR.  Supportive care.  Recent echo noted below.  5.  COPD.  At baseline no wheezing continue supportive care.  6.  Obesity with OSA.  BMI of 46 follow with PCP for weight loss, nighttime CPAP.  Noncompliant.  Counseled.  She refused CPAP night of 08/27/2021 and counseled again.  7.  CKD 3B.  Baseline creatinine around 3.  Monitor with diuresis.  8.  Hypokalemia.  Replaced.  9.  Dyslipidemia.  On Crestor.  10.  GERD.  PPI.  11.  DM type II.  SSI  Lab Results  Component Value Date   HGBA1C 6.5 (H) 08/27/2021    CBG (last 3)  Recent Labs    08/29/21 1643 08/29/21 2112 08/30/21 0654  GLUCAP 126* 143* 116*           Condition - Extremely Guarded  Family Communication  :  None present  Code Status :  Full  Consults  :  Cards  PUD Prophylaxis : PPI   Procedures  :      TTE 07/29/21 -   1. Left ventricular ejection fraction, by estimation, is 40 to 45%. The left ventricle has mildly decreased function. The left ventricle  demonstrates global hypokinesis. There is mild left ventricular hypertrophy. Left ventricular diastolic parameters are indeterminate.   2. Right ventricular systolic function is mildly reduced. The right ventricular size is normal. There is normal pulmonary artery systolic pressure.   3. Left  atrial size was moderately dilated.   4. Right atrial size was mildly dilated.   5. HR 90 bpm. MV peak gradient, 14.1 mmHg. The mean mitral valve gradient is 6.5 mmHg . There is a 30 mm prosthetic annuloplasty ring present in the mitral position.   6. S/p repair with an annuloplasty ring.   7. The aortic valve is normal in structure. Aortic valve regurgitation is not visualized. No aortic stenosis is present.   8. The inferior vena cava is normal in size with greater than 50% respiratory variability, suggesting right atrial pressure of 3 mmHg.      Disposition Plan  :    Status is: Observation  DVT Prophylaxis  :    apixaban (ELIQUIS) tablet 5 mg     Lab Results  Component Value Date   PLT 150 08/30/2021    Diet :  Diet Order             Diet heart healthy/carb modified Room service appropriate? Yes; Fluid consistency: Thin  Diet effective now                    Inpatient Medications  Scheduled Meds:  amiodarone  200 mg Oral BID   apixaban  5 mg Oral BID   busPIRone  5 mg Oral BID   colchicine  0.3 mg Oral Daily   cycloSPORINE  1 drop Both Eyes BID   docusate sodium  100 mg Oral BID   DULoxetine  60 mg Oral Daily   fluticasone  2 spray Each Nare Daily   insulin aspart  0-5 Units Subcutaneous QHS   insulin aspart  0-9 Units Subcutaneous TID WC   loratadine  10 mg Oral Daily   mouth rinse  15 mL Mouth Rinse BID   metolazone  2.5 mg Oral Q M,W,F   metoprolol succinate  37.5 mg Oral Daily   multivitamin with minerals  1 tablet Oral Daily   pantoprazole  40 mg Oral Daily   potassium chloride  40 mEq Oral BID   rosuvastatin  10 mg Oral QPM   sodium chloride flush  3 mL Intravenous Q12H   torsemide  60 mg Oral BID   Continuous Infusions:  sodium chloride 250 mL (08/27/21 2138)   PRN Meds:.sodium chloride, acetaminophen **OR** acetaminophen, albuterol, ALPRAZolam, guaiFENesin-dextromethorphan, nitroGLYCERIN, sodium chloride flush  Antibiotics  :     Anti-infectives (From admission, onward)    None        Time Spent in minutes  30   Lala Lund M.D on 08/30/2021 at 11:47 AM  To page go to [email or web address]   [facility] -  Office  [phone]  See all Orders from today for further details    Objective:   Vitals:   08/29/21 2321 08/30/21 0412 08/30/21 0704 08/30/21 0727  BP: 130/75 116/64  127/72  Pulse:  73  81  Resp: (!) 25 17  19   Temp: 98.6 F (37 C) 98.3 F (36.8 C)  98.3 F (36.8 C)  TempSrc: Oral Oral  Oral  SpO2:  96%  93%  Weight:   127 kg   Height:        Wt Readings from Last 3 Encounters:  08/30/21 127 kg  08/18/21 127.5 kg  08/11/21 127.5 kg     Intake/Output Summary (Last 24 hours) at 08/30/2021 1147 Last data filed at 08/30/2021 0846 Gross per 24 hour  Intake 123 ml  Output 4650 ml  Net -4527 ml     Physical Exam  Awake Alert, No new F.N deficits, flat affect [place].AT,PERRAL Supple Neck, No JVD,   Symmetrical Chest wall movement, Good air movement bilaterally, CTAB RRR,No Gallops, Rubs or new Murmurs,  +ve B.Sounds, Abd Soft, No tenderness,   1+ edema    Data Review:    CBC Recent Labs  Lab 08/26/21 1215 08/27/21 0232 08/28/21 0113 08/29/21 0642 08/30/21 0531  WBC 4.5 3.9* 4.8 4.6 4.1  HGB 12.1 11.4* 10.8* 10.7* 10.8*  HCT 38.1 35.6* 34.3* 32.8* 33.2*  PLT 159 133* 138* 142* 150  MCV 89.9 90.8 90.7 88.6 87.4  MCH 28.5 29.1 28.6 28.9 28.4  MCHC 31.8 32.0 31.5 32.6 32.5  RDW 14.4 14.6 14.6 14.5 14.2  LYMPHSABS  --   --  1.6 1.4 1.7  MONOABS  --   --  0.8 0.8 0.8  EOSABS  --   --  0.2 0.1 0.1  BASOSABS  --   --  0.0 0.0 0.0    Electrolytes Recent Labs  Lab 08/26/21 1215 08/26/21 1416 08/27/21 0232 08/27/21 1304 08/28/21 0113 08/29/21 0642 08/30/21 0531  NA 138  --  139  --  136 136 137  K 3.9  --  3.2* 4.4 4.6 4.5 3.5  CL 102  --  101  --  101 100 98  CO2 27  --  28  --  28 27 29   GLUCOSE 162*  --  137*  --  147* 107* 99  BUN 26*  --  27*  --  31* 32* 35*  CREATININE 2.61*  --  2.62*  --  2.86* 2.75* 2.66*  CALCIUM 9.0  --  8.5*  --  8.5* 8.7* 8.5*  AST 41  --   --   --  34 24 23  ALT 41  --   --   --  33 27 24  ALKPHOS 175*  --   --   --  141* 149* 156*  BILITOT 0.5  --   --   --  0.5 0.8 0.6  ALBUMIN 3.2*  --   --   --  2.7* 2.6* 2.7*  MG  --  2.1  --   --  2.8* 2.2 2.1  CRP  --   --   --  1.2* 1.1* 4.4* 7.0*  HGBA1C  --   --   --  6.5*  --   --   --   BNP  --  170.2*  --  31.4 36.0 90.3 88.1    ------------------------------------------------------------------------------------------------------------------ No results for input(s): CHOL, HDL, LDLCALC, TRIG, CHOLHDL, LDLDIRECT in the last 72 hours.  Lab Results  Component Value Date   HGBA1C 6.5 (H) 08/27/2021    No results for input(s): TSH, T4TOTAL, T3FREE, THYROIDAB in the last 72 hours.  Invalid input(s): FREET3 ------------------------------------------------------------------------------------------------------------------ ID Labs Recent Labs  Lab 08/26/21 1215 08/27/21 0232 08/27/21 1304 08/28/21 0113 08/29/21 0642 08/30/21 0531  WBC 4.5 3.9*  --  4.8 4.6 4.1  PLT 159 133*  --  138* 142* 150  CRP  --   --  1.2* 1.1* 4.4* 7.0*  CREATININE 2.61* 2.62*  --  2.86* 2.75* 2.66*   Cardiac Enzymes No results for input(s): CKMB, TROPONINI, MYOGLOBIN in the last 168 hours.  Invalid input(s): CK  Radiology Reports DG Chest 2 View  Result Date: 08/26/2021 CLINICAL DATA:  cp EXAM: CHEST - 2 VIEW COMPARISON:  December 2022 FINDINGS: Cardiomegaly. Left atrial appendage clip present. Median sternotomy wires. No pleural effusion. No pneumothorax. No overt edema or lobar consolidation. IMPRESSION: Cardiomegaly.  No overt edema or effusion. Electronically Signed   By: Albin Felling M.D.   On: 08/26/2021 13:15   DG Chest Port 1 View  Result Date: 08/29/2021 CLINICAL DATA:  Dyspnea, COPD, CHF EXAM: PORTABLE CHEST 1 VIEW COMPARISON:  08/26/2021 chest radiograph.  FINDINGS: Intact sternotomy wires. Tricuspid valve annuloplasty ring in place. Stable cardiomediastinal silhouette with mild to moderate cardiomegaly. No pneumothorax. No pleural effusion. No overt pulmonary edema. Minimal patchy peripheral right lung base opacity. IMPRESSION: 1. Stable mild-to-moderate cardiomegaly without overt pulmonary edema. 2. Minimal patchy peripheral right lung base opacity, cannot exclude mild aspiration or pneumonia. Chest radiograph follow-up advised. Electronically Signed   By: Ilona Sorrel M.D.   On: 08/29/2021 10:56

## 2021-08-30 NOTE — Progress Notes (Signed)
Progress Note  Patient Name: [name] Date of Encounter: 08/30/2021  [facility] Cardiologist: Buford Dresser, MD   Subjective   Patient reports that she continues to have chest pain, been present intermittently since admission. Pain is located throughout the chest and worse with cough. Patient becomes SOB on exertion. Able to tolerate laying flat on her back. Becomes dizzy when she stands up. Denies palpitations.    Inpatient Medications    Scheduled Meds:  amiodarone  200 mg Oral BID   apixaban  5 mg Oral BID   busPIRone  5 mg Oral BID   colchicine  0.3 mg Oral Daily   cycloSPORINE  1 drop Both Eyes BID   docusate sodium  100 mg Oral BID   DULoxetine  60 mg Oral Daily   fluticasone  2 spray Each Nare Daily   insulin aspart  0-5 Units Subcutaneous QHS   insulin aspart  0-9 Units Subcutaneous TID WC   loratadine  10 mg Oral Daily   mouth rinse  15 mL Mouth Rinse BID   metolazone  2.5 mg Oral Q M,W,F   metoprolol succinate  37.5 mg Oral Daily   multivitamin with minerals  1 tablet Oral Daily   pantoprazole  40 mg Oral Daily   rosuvastatin  10 mg Oral QPM   sodium chloride flush  3 mL Intravenous Q12H   torsemide  60 mg Oral BID   Continuous Infusions:  sodium chloride 250 mL (08/27/21 2138)   PRN Meds: sodium chloride, acetaminophen **OR** acetaminophen, albuterol, ALPRAZolam, guaiFENesin-dextromethorphan, nitroGLYCERIN, sodium chloride flush   Vital Signs    Vitals:   08/29/21 2321 08/30/21 0412 08/30/21 0704 08/30/21 0727  BP: 130/75 116/64  127/72  Pulse:  73  81  Resp: (!) _0 Temp: 98.6 F (37 C) 98.3 F (36.8 C)  98.3 F (36.8 C)  TempSrc: Oral Oral  Oral  SpO2:  96%  93%  Weight:   127 kg   Height:        Intake/Output Summary (Last 24 hours) at 08/30/2021 0830 Last data filed at 08/30/2021 0412 Gross per 24 hour  Intake 123 ml  Output 4950 ml  Net -4827 ml   Last 3 Weights 08/30/2021 08/28/2021 08/26/2021  Weight (lbs)  280 lb 281 lb 15.5 oz 298 lb  Weight (kg) 127.007 kg 127.9 kg 135.172 kg  Some encounter information is confidential and restricted. Go to Review Flowsheets activity to see all data.      Telemetry    Predominantly sinus rhythm, occasional PVCs and trigeminy - Personally Reviewed  ECG    No new tracings since 1/6 - Personally Reviewed  Physical Exam   GEN: No acute distress.   Neck: No JVD Cardiac: RRR, no murmurs, rubs, or gallops.  Respiratory: Occasional rales at lung bases that clear with coughing  GI: Soft, nontender, non-distended  MS: 1+ edema in BLE; No deformity. Neuro:  Nonfocal  Psych: Normal affect   Labs    High Sensitivity Troponin:   Recent Labs  Lab 08/26/21 1215 08/26/21 1416  TROPONINIHS 10 10     Chemistry Recent Labs  Lab 08/28/21 0113 08/29/21 0642 08/30/21 0531  NA 136 136 137  K 4.6 4.5 3.5  CL 101 100 98  CO2 _1 GLUCOSE 147* 107* 99  BUN 31* 32* 35*  CREATININE 2.86* 2.75* 2.66*  CALCIUM 8.5* 8.7* 8.5*  MG 2.8* 2.2 2.1  PROT 6.9 7.0 7.2  ALBUMIN 2.7* 2.6* 2.7*  °AST 34 24 23  °ALT 33 27 24  °ALKPHOS 141* 149* 156*  °BILITOT 0.5 0.8 0.6  °GFRNONAA 18* 19* 19*  °ANIONGAP 7 9 10  °  °Lipids No results for input(s): CHOL, TRIG, HDL, LABVLDL, LDLCALC, CHOLHDL in the last 168 hours.  °Hematology °Recent Labs  °Lab 08/28/21 °0113 08/29/21 °0642 08/30/21 °0531  °WBC 4.8 4.6 4.1  °RBC 3.78* 3.70* 3.80*  °HGB 10.8* 10.7* 10.8*  °HCT 34.3* 32.8* 33.2*  °MCV 90.7 88.6 87.4  °MCH 28.6 28.9 28.4  °MCHC 31.5 32.6 32.5  °RDW 14.6 14.5 14.2  °PLT 138* 142* 150  ° °Thyroid No results for input(s): TSH, FREET4 in the last 168 hours.  °BNP °Recent Labs  °Lab 08/28/21 °0113 08/29/21 °0642 08/30/21 °0531  °BNP 36.0 90.3 88.1  °  °DDimer No results for input(s): DDIMER in the last 168 hours.  ° °Radiology  °  °DG Chest Port 1 View ° °Result Date: 08/29/2021 °CLINICAL DATA:  Dyspnea, COPD, CHF EXAM: PORTABLE CHEST 1 VIEW COMPARISON:  08/26/2021 chest radiograph.  FINDINGS: Intact sternotomy wires. Tricuspid valve annuloplasty ring in place. Stable cardiomediastinal silhouette with mild to moderate cardiomegaly. No pneumothorax. No pleural effusion. No overt pulmonary edema. Minimal patchy peripheral right lung base opacity. IMPRESSION: 1. Stable mild-to-moderate cardiomegaly without overt pulmonary edema. 2. Minimal patchy peripheral right lung base opacity, cannot exclude mild aspiration or pneumonia. Chest radiograph follow-up advised. Electronically Signed   By: Jason A Poff M.D.   On: 08/29/2021 10:56   ° °Cardiac Studies  ° °Right Heart Cath 07/29/21  °Findings: °  °RA = 5 °RV = 36/8 °PA = 40/11 (26) °PCW = 13 °Fick cardiac output/index = 4.3/1.9 °PVR = 3.0 WU °FA sat = 95% °PA sat = 52%, 55% °  °Assessment: °Normal filling pressures with mild PAH °Moderately reduced cardiac out likely due in part to low fluid status °  °Echo 07/29/21 ° 1. Left ventricular ejection fraction, by estimation, is 40 to 45%. The  °left ventricle has mildly decreased function. The left ventricle  °demonstrates global hypokinesis. There is mild left ventricular  °hypertrophy. Left ventricular diastolic parameters  °are indeterminate.  ° 2. Right ventricular systolic function is mildly reduced. The right  °ventricular size is normal. There is normal pulmonary artery systolic  °pressure.  ° 3. Left atrial size was moderately dilated.  ° 4. Right atrial size was mildly dilated.  ° 5. HR 90 bpm. MV peak gradient, 14.1 mmHg. The mean mitral valve gradient  °is 6.5 mmHg  °    . There is a 30 mm prosthetic annuloplasty ring present in the mitral  °position.  ° 6. S/p repair with an annuloplasty ring.  ° 7. The aortic valve is normal in structure. Aortic valve regurgitation is  °not visualized. No aortic stenosis is present.  ° 8. The inferior vena cava is normal in size with greater than 50%  °respiratory variability, suggesting right atrial pressure of 3 mmHg.  ° °Patient Profile  °   °65 y.o. female  with complex cardiac history including severe MR/TR s/p repair, chronic combined systolic and diastolic heart failure, atrial fibrillation s/p MAZE with recurrence, atypical atrial flutter, frequent hospitalizations, chronic kidney disease stage 4. She presents after having elevated heart rates since 1/4. She also notes a 20 lb weight gain since her hospital discharge on 07/31/21. Found to be Covid positive on testing. ° °  °Atrial Flutter °- History of atrial fibrillation status post Maze   procedure in May 2022, usual regiment of amiodarone 200mg PO daily, eliquis 5mg BID  °- Patient was given IV amiodarone starting on 1/6 and converted to NSR. Transitioned to oral amiodarone on 1/8 - currently on amiodarone 200mg BID °- Per telemetry, patient remains in sinus rhythm. Plan to continue amiodarone PO with outpatient follow up for dose adjustment. Continue eliquis 5 mg BID  °- Plan to arrange EP follow up for after discharge as patient has complex/atypical atrial fibrillation/flutter. Patient has previously been seen by Dr. Lambert with consideration of AVN ablation + PPM going forward if needed ° °Acute on chronic systolic and diastolic heart failure: Echo on 07/29/21 showed LVEF 40-45% (similar to prior), LV global hypokinesis, mildly reduced LV/RV systolic function, moderately dilated LA  °- Patient continues to have 1+ pedal edema and crackles in lungs °- Patient net -7.25 L fluids, weight has decreased from 135.2 kj on admission  to 127 kg  °- Patient was taken off aldactone and K supplementation yesterday 1/8 as there was concerns about being on them with her chronic renal failure (Creatinine 2.66, baseline is around 2.6-3. eGFR 19, baseline17-20) °-Transitioned to PO demadex this AM, continue oral metolazone MWF. Closely monitor K, Mag, and creatinine    °- Continue  metoprolol succinate ° °MR/TR  °- post repair with 30 mm annuloplasty ring, normal function on TTE 07/29/21  ° °CKD stage IV  °- Creatinine 2.66,  baseline is around 2.6-3 °- eGFR 19, baseline 17-20 °- Closely monitor creatinine with patient on metolazone. Monitor K and Mag   °  °COVID Positive °- Treatment per primary team  ° °PVCs: noted on telemetry  °- Continue metoprolol succinate  °- Hesitant to aggressively replete K given kidney function (K 3.5) °   ° °For questions or updates, please contact [facility] °Please consult [email or web address] for contact info under  ° °  °   °Signed, ° R. , PA-C  °08/30/2021, 8:30 AM   ° °

## 2021-08-30 NOTE — Progress Notes (Signed)
Mobility Specialist Progress Note    08/30/21 1359  Mobility  Activity Ambulated in hall  Level of Assistance Standby assist, set-up cues, supervision of patient - no hands on  Assistive Device Four wheel walker  Distance Ambulated (ft) 165 ft  Mobility Ambulated with assistance in hallway  Mobility Response Tolerated fair  Mobility performed by Mobility specialist  $Mobility charge 1 Mobility   Pt received in bed and agreeable. Had successful void on BSC. No complaints on walk. Upon return had productive cough. Left in bed with call bell in reach.   [facility] Mobility Specialist  M.S. 2C and 6E: [phone] M.S. 4E: [phone]

## 2021-08-30 NOTE — Progress Notes (Signed)
Physical Therapy Treatment Patient Details Name: [name] MRN: [id number] DOB: 12/10/1955 Today's Date: 08/30/2021   History of Present Illness Pt is a 66 y.o. female admitted 08/26/21 with chest pain, SOB. Workup for hypoxic respiratory failure secondary to acute on chronic CHF, afib with RVR; incidental (+) COVID-19. PMH includes CHF, CKD IV, severe MR, OSA, COPD, asthma, DVT, OSA, DM2, afib on Eliquis.   PT Comments    Pt progressing with mobility. Today's session focused on ambulation for improving strength and activity tolerance; pt moving well, but needs to move more beyond sitting up in recliner. Encouraged more frequent in-room ambulation, as well as activity with mobility specialists. Will continue to follow acutely to address established goals.    Recommendations for follow up therapy are one component of a multi-disciplinary discharge planning process, led by the attending physician.  Recommendations may be updated based on patient status, additional functional criteria and insurance authorization.  Follow Up Recommendations  No PT follow up     Assistance Recommended at Discharge PRN  Patient can return home with the following Assistance with cooking/housework;Assist for transportation   Equipment Recommendations  None recommended by PT    Recommendations for Other Services       Precautions / Restrictions Precautions Precautions: Fall Restrictions Weight Bearing Restrictions: No     Mobility  Bed Mobility               General bed mobility comments: Received sitting in recliner    Transfers Overall transfer level: Modified independent Equipment used: Rolling walker (2 wheels)               General transfer comment: requesting use of RW    Ambulation/Gait Ambulation/Gait assistance: Supervision Gait Distance (Feet): 60 Feet Assistive device: Rolling walker (2 wheels) Gait Pattern/deviations: Step-through pattern;Decreased stride  length;Trunk flexed Gait velocity: Decreased     General Gait Details: Ambulating laps around room with RW and supervision for safety/lines; pt declines further distance secondary to fatigue; declined hallway ambulation   Stairs             Wheelchair Mobility    Modified Rankin (Stroke Patients Only)       Balance Overall balance assessment: Needs assistance   Sitting balance-Leahy Scale: Good     Standing balance support: No upper extremity supported;During functional activity Standing balance-Leahy Scale: Fair                              Cognition Arousal/Alertness: Awake/alert Behavior During Therapy: WFL for tasks assessed/performed;Flat affect Overall Cognitive Status: Within Functional Limits for tasks assessed                                          Exercises      General Comments        Pertinent Vitals/Pain Pain Assessment: Faces Faces Pain Scale: Hurts a little bit Pain Location: chest with deep breath Pain Descriptors / Indicators: Sore Pain Intervention(s): Monitored during session    Home Living                          Prior Function            PT Goals (current goals can now be found in the care plan section) Progress towards PT goals: Progressing toward  goals    Frequency    Min 3X/week      PT Plan Current plan remains appropriate    Co-evaluation              AM-PAC PT "6 Clicks" Mobility   Outcome Measure  Help needed turning from your back to your side while in a flat bed without using bedrails?: None Help needed moving from lying on your back to sitting on the side of a flat bed without using bedrails?: None Help needed moving to and from a bed to a chair (including a wheelchair)?: None Help needed standing up from a chair using your arms (e.g., wheelchair or bedside chair)?: None Help needed to walk in hospital room?: A Little Help needed climbing 3-5 steps with a  railing? : A Little 6 Click Score: 22    End of Session   Activity Tolerance: Patient tolerated treatment well;Patient limited by fatigue Patient left: in chair;with call bell/phone within reach Nurse Communication: Mobility status PT Visit Diagnosis: Other abnormalities of gait and mobility (R26.89)     Time: 2585-2778 PT Time Calculation (min) (ACUTE ONLY): 19 min  Charges:  $Therapeutic Exercise: 8-22 mins                     Mabeline Caras, PT, DPT Acute Rehabilitation Services  Pager [phone] Office [place] 08/30/2021, 11:44 AM

## 2021-08-31 DIAGNOSIS — I5043 Acute on chronic combined systolic (congestive) and diastolic (congestive) heart failure: Secondary | ICD-10-CM | POA: Diagnosis not present

## 2021-08-31 DIAGNOSIS — I4892 Unspecified atrial flutter: Secondary | ICD-10-CM | POA: Diagnosis not present

## 2021-08-31 DIAGNOSIS — I10 Essential (primary) hypertension: Secondary | ICD-10-CM

## 2021-08-31 LAB — CBC WITH DIFFERENTIAL/PLATELET
Abs Immature Granulocytes: 0.01 10*3/uL (ref 0.00–0.07)
Basophils Absolute: 0 10*3/uL (ref 0.0–0.1)
Basophils Relative: 0 %
Eosinophils Absolute: 0 10*3/uL (ref 0.0–0.5)
Eosinophils Relative: 0 %
HCT: 34.2 % — ABNORMAL LOW (ref 36.0–46.0)
Hemoglobin: 11.4 g/dL — ABNORMAL LOW (ref 12.0–15.0)
Immature Granulocytes: 0 %
Lymphocytes Relative: 28 %
Lymphs Abs: 1 10*3/uL (ref 0.7–4.0)
MCH: 28.3 pg (ref 26.0–34.0)
MCHC: 33.3 g/dL (ref 30.0–36.0)
MCV: 84.9 fL (ref 80.0–100.0)
Monocytes Absolute: 0.5 10*3/uL (ref 0.1–1.0)
Monocytes Relative: 13 %
Neutro Abs: 2 10*3/uL (ref 1.7–7.7)
Neutrophils Relative %: 59 %
Platelets: 193 10*3/uL (ref 150–400)
RBC: 4.03 MIL/uL (ref 3.87–5.11)
RDW: 13.3 % (ref 11.5–15.5)
WBC: 3.4 10*3/uL — ABNORMAL LOW (ref 4.0–10.5)
nRBC: 0 % (ref 0.0–0.2)

## 2021-08-31 LAB — COMPREHENSIVE METABOLIC PANEL
ALT: 23 U/L (ref 0–44)
AST: 24 U/L (ref 15–41)
Albumin: 2.8 g/dL — ABNORMAL LOW (ref 3.5–5.0)
Alkaline Phosphatase: 161 U/L — ABNORMAL HIGH (ref 38–126)
Anion gap: 11 (ref 5–15)
BUN: 43 mg/dL — ABNORMAL HIGH (ref 8–23)
CO2: 27 mmol/L (ref 22–32)
Calcium: 9.1 mg/dL (ref 8.9–10.3)
Chloride: 96 mmol/L — ABNORMAL LOW (ref 98–111)
Creatinine, Ser: 2.89 mg/dL — ABNORMAL HIGH (ref 0.44–1.00)
GFR, Estimated: 17 mL/min — ABNORMAL LOW (ref >60)
Glucose, Bld: 167 mg/dL — ABNORMAL HIGH (ref 70–99)
Potassium: 4.1 mmol/L (ref 3.5–5.1)
Sodium: 134 mmol/L — ABNORMAL LOW (ref 135–145)
Total Bilirubin: 0.7 mg/dL (ref 0.3–1.2)
Total Protein: 7.9 g/dL (ref 6.5–8.1)

## 2021-08-31 LAB — GLUCOSE, CAPILLARY
Glucose-Capillary: 182 mg/dL — ABNORMAL HIGH (ref 70–99)
Glucose-Capillary: 157 mg/dL — ABNORMAL HIGH (ref 70–99)
Glucose-Capillary: 203 mg/dL — ABNORMAL HIGH (ref 70–99)
Glucose-Capillary: 347 mg/dL — ABNORMAL HIGH (ref 70–99)

## 2021-08-31 LAB — BRAIN NATRIURETIC PEPTIDE: B Natriuretic Peptide: 132.7 pg/mL — ABNORMAL HIGH (ref 0.0–100.0)

## 2021-08-31 LAB — MAGNESIUM: Magnesium: 2.1 mg/dL (ref 1.7–2.4)

## 2021-08-31 LAB — C-REACTIVE PROTEIN: CRP: 5.6 mg/dL — ABNORMAL HIGH (ref <1.0)

## 2021-08-31 MED ORDER — POTASSIUM CHLORIDE CRYS ER 20 MEQ PO TBCR
40.0000 meq | EXTENDED_RELEASE_TABLET | Freq: Every day | ORAL | Status: DC
  Administered 2021-08-31 – 2021-09-04 (×5): 40 meq via ORAL
  Filled 2021-08-31 (×5): qty 2

## 2021-08-31 MED ORDER — ACETAMINOPHEN 325 MG PO TABS
650.0000 mg | ORAL_TABLET | Freq: Once | ORAL | Status: AC
  Administered 2021-08-31: 650 mg via ORAL
  Filled 2021-08-31: qty 2

## 2021-08-31 NOTE — Progress Notes (Signed)
Patient ID: [name], female   DOB: May 27, 1956, 66 y.o.   MRN: [id number]  PROGRESS NOTE    Lolita Faulds  JEH:[id number] DOB: Nov 22, 1955 DOA: 08/26/2021 PCP: [name], NP   Brief Narrative:  66 y.o. female with medical history significant of PAF on  eliquis with hx of multiple cardioversions, severe MR/TR,  V7CH, chronic diastolic/systolic CHF, CKD stage IV, severe MR s/p mitral valve replacement in 12/2020, OSA, COPD who presented to ED with chest pain and shortness of breath.  On presentation, she was found to be in A. fib with RVR along with acute on chronic systolic CHF and admitted to the hospital on IV Lasix.  Cardiology was consulted for her work-up further showed she also had incidental COVID-19 infection.  Assessment & Plan:   Acute hypoxic respiratory failure Acute on chronic systolic heart failure -Respiratory status improving.  Currently on room air. -Initially treated with IV Lasix.  Currently already on oral torsemide -EF improved to 45% from 20% in the past. -Cardiology following: Continue metoprolol succinate, torsemide and metolazone. -Strict input and output.  Daily weights.  Fluid restriction.  Negative balance of 8706.6 cc since admission.  COVID-19 infection -Currently on room air. -Monitor daily inflammatory markers COVID-19 Labs  Recent Labs    08/29/21 0642 08/30/21 0531 08/31/21 0114  CRP 4.4* 7.0* 5.6*    Lab Results  Component Value Date   SARSCOV2NAA POSITIVE (A) 08/26/2021   SARSCOV2NAA NEGATIVE 07/28/2021   [place] NEGATIVE 05/02/2021   [place] NEGATIVE 02/25/2021   -Started on IV Decadron and oral molnupiravir on 08/30/2021  Paroxysmal A. fib with RVR -Currently rate controlled.  Continue oral amiodarone and Eliquis.  Outpatient follow-up with cardiology  Severe MR status post repair/severe TR -Cardiology following.  COPD -Currently stable.  Hyperlipidemia - continue  statin  Hypokalemia -Improved.  GERD -Continue PPI  CKD stage IV -Baseline creatinine around 2.6-3.  Creatinine 2.89 today.  Diabetes mellitus type 2 -Continue CBGs with SSI   DVT prophylaxis: Eliquis Code Status: Full Family Communication: None at bedside Disposition Plan: Status is: Inpatient  Remains inpatient appropriate because: Of severity of illness  Consultants: Cardiology  Procedures: Echo  Antimicrobials: None   Subjective: Patient seen and examined at bedside.  Does not feel well and does not feel ready to go home today.  Still short of breath with exertion.  No overnight fever, nausea or vomiting reported. Objective: Vitals:   08/31/21 0309 08/31/21 0635 08/31/21 0830 08/31/21 1106  BP: 118/66  (!) 118/59 121/67  Pulse: 74  74 90  Resp: 18   19  Temp: 98.2 F (36.8 C)  98.1 F (36.7 C) 97.6 F (36.4 C)  TempSrc: Oral  Oral Oral  SpO2: 94%   94%  Weight:  123.1 kg    Height:        Intake/Output Summary (Last 24 hours) at 08/31/2021 1147 Last data filed at 08/31/2021 0911 Gross per 24 hour  Intake 600 ml  Output 1100 ml  Net -500 ml   Filed Weights   08/28/21 0627 08/30/21 0704 08/31/21 0635  Weight: 127.9 kg 127 kg 123.1 kg    Examination:  General exam: Appears calm and comfortable.  Currently on room air.  Looks chronically ill and deconditioned. Respiratory system: Bilateral decreased breath sounds at bases with scattered crackles Cardiovascular system: S1 & S2 heard, Rate controlled Gastrointestinal system: Abdomen is nondistended, soft and nontender. Normal bowel sounds heard. Extremities: No cyanosis, clubbing; trace lower extremity edema present Central nervous  system: Alert and oriented.  Slow to respond.  No focal neurological deficits. Moving extremities Skin: No rashes, lesions or ulcers Psychiatry: Affect is mostly flat.   Data Reviewed: I have personally reviewed following labs and imaging studies  CBC: Recent Labs   Lab 08/27/21 0232 08/28/21 0113 08/29/21 0642 08/30/21 0531 08/31/21 0114  WBC 3.9* 4.8 4.6 4.1 3.4*  NEUTROABS  --  2.2 2.2 1.4* 2.0  HGB 11.4* 10.8* 10.7* 10.8* 11.4*  HCT 35.6* 34.3* 32.8* 33.2* 34.2*  MCV 90.8 90.7 88.6 87.4 84.9  PLT 133* 138* 142* 150 938   Basic Metabolic Panel: Recent Labs  Lab 08/26/21 1416 08/27/21 0232 08/27/21 1304 08/28/21 0113 08/29/21 0642 08/30/21 0531 08/31/21 0114  NA  --  139  --  136 136 137 134*  K  --  3.2* 4.4 4.6 4.5 3.5 4.1  CL  --  101  --  101 100 98 96*  CO2  --  28  --  28 27 29 27   GLUCOSE  --  137*  --  147* 107* 99 167*  BUN  --  27*  --  31* 32* 35* 43*  CREATININE  --  2.62*  --  2.86* 2.75* 2.66* 2.89*  CALCIUM  --  8.5*  --  8.5* 8.7* 8.5* 9.1  MG 2.1  --   --  2.8* 2.2 2.1 2.1   GFR: Estimated Creatinine Clearance: 26.4 mL/min (A) (by C-G formula based on SCr of 2.89 mg/dL (H)). Liver Function Tests: Recent Labs  Lab 08/26/21 1215 08/28/21 0113 08/29/21 0642 08/30/21 0531 08/31/21 0114  AST 41 34 24 23 24   ALT 41 33 27 24 23   ALKPHOS 175* 141* 149* 156* 161*  BILITOT 0.5 0.5 0.8 0.6 0.7  PROT 7.8 6.9 7.0 7.2 7.9  ALBUMIN 3.2* 2.7* 2.6* 2.7* 2.8*   Recent Labs  Lab 08/26/21 1215  LIPASE 25   No results for input(s): AMMONIA in the last 168 hours. Coagulation Profile: No results for input(s): INR, PROTIME in the last 168 hours. Cardiac Enzymes: No results for input(s): CKTOTAL, CKMB, CKMBINDEX, TROPONINI in the last 168 hours. BNP (last 3 results) No results for input(s): PROBNP in the last 8760 hours. HbA1C: No results for input(s): HGBA1C in the last 72 hours. CBG: Recent Labs  Lab 08/30/21 1153 08/30/21 1723 08/30/21 2116 08/31/21 0626 08/31/21 1101  GLUCAP 178* 176* 312* 182* 157*   Lipid Profile: No results for input(s): CHOL, HDL, LDLCALC, TRIG, CHOLHDL, LDLDIRECT in the last 72 hours. Thyroid Function Tests: No results for input(s): TSH, T4TOTAL, FREET4, T3FREE, THYROIDAB in the  last 72 hours. Anemia Panel: No results for input(s): VITAMINB12, FOLATE, FERRITIN, TIBC, IRON, RETICCTPCT in the last 72 hours. Sepsis Labs: No results for input(s): PROCALCITON, LATICACIDVEN in the last 168 hours.  Recent Results (from the past 240 hour(s))  Resp Panel by RT-PCR (Flu A&B, Covid) Nasopharyngeal Swab     Status: Abnormal   Collection Time: 08/26/21  3:34 PM   Specimen: Nasopharyngeal Swab; Nasopharyngeal(NP) swabs in vial transport medium  Result Value Ref Range Status   SARS Coronavirus 2 by RT PCR POSITIVE (A) NEGATIVE Final    Comment: (NOTE) SARS-CoV-2 target nucleic acids are DETECTED.  The SARS-CoV-2 RNA is generally detectable in upper respiratory specimens during the acute phase of infection. Positive results are indicative of the presence of the identified virus, but do not rule out bacterial infection or co-infection with other pathogens not detected by the test. Clinical correlation  with patient history and other diagnostic information is necessary to determine patient infection status. The expected result is Negative.  Fact Sheet for Patients: EntrepreneurPulse.com.au  Fact Sheet for Healthcare Providers: IncredibleEmployment.be  This test is not yet approved or cleared by the [place] FDA and  has been authorized for detection and/or diagnosis of SARS-CoV-2 by FDA under an Emergency Use Authorization (EUA).  This EUA will remain in effect (meaning this test can be used) for the duration of  the COVID-19 declaration under Section 564(b)(1) of the A ct, 21 U.S.C. section 360bbb-3(b)(1), unless the authorization is terminated or revoked sooner.     Influenza A by PCR NEGATIVE NEGATIVE Final   Influenza B by PCR NEGATIVE NEGATIVE Final    Comment: (NOTE) The Xpert Xpress SARS-CoV-2/FLU/RSV plus assay is intended as an aid in the diagnosis of influenza from Nasopharyngeal swab specimens and should not be used  as a sole basis for treatment. Nasal washings and aspirates are unacceptable for Xpert Xpress SARS-CoV-2/FLU/RSV testing.  Fact Sheet for Patients: EntrepreneurPulse.com.au  Fact Sheet for Healthcare Providers: IncredibleEmployment.be  This test is not yet approved or cleared by the [place] FDA and has been authorized for detection and/or diagnosis of SARS-CoV-2 by FDA under an Emergency Use Authorization (EUA). This EUA will remain in effect (meaning this test can be used) for the duration of the COVID-19 declaration under Section 564(b)(1) of the Act, 21 U.S.C. section 360bbb-3(b)(1), unless the authorization is terminated or revoked.  Performed at [facility], [place] [address], [place], [place] [address]          Radiology Studies: No results found.      Scheduled Meds:  amiodarone  200 mg Oral BID   apixaban  5 mg Oral BID   busPIRone  5 mg Oral BID   colchicine  0.3 mg Oral Daily   cycloSPORINE  1 drop Both Eyes BID   dexamethasone (DECADRON) injection  6 mg Intravenous Q24H   docusate sodium  100 mg Oral BID   DULoxetine  60 mg Oral Daily   fluticasone  2 spray Each Nare Daily   insulin aspart  0-5 Units Subcutaneous QHS   insulin aspart  0-9 Units Subcutaneous TID WC   loratadine  10 mg Oral Daily   mouth rinse  15 mL Mouth Rinse BID   metolazone  2.5 mg Oral Q M,W,F   metoprolol succinate  37.5 mg Oral Daily   molnupiravir EUA  4 capsule Oral BID   multivitamin with minerals  1 tablet Oral Daily   pantoprazole  40 mg Oral Daily   potassium chloride  40 mEq Oral Daily   rosuvastatin  10 mg Oral QPM   sodium chloride flush  3 mL Intravenous Q12H   torsemide  60 mg Oral BID   Continuous Infusions:  sodium chloride 250 mL (08/27/21 2138)          Aline August, MD [facility] 08/31/2021, 11:47 AM

## 2021-08-31 NOTE — Progress Notes (Addendum)
Progress Note  Patient Name: [name] Date of Encounter: 08/31/2021  [facility] Cardiologist: Buford Dresser, MD   Subjective   Reports feeling slightly better than yesterday and feels like she is improving with her current treatments. Continues to have some noncardiac chest pain that is worse when she coughs and is present throughout the chest. Her SOB has been improving, but she continues to have some trouble breathing due to feeling like her chest is congested. Occasional lightheadedness and headaches. Denies palpitations   Inpatient Medications    Scheduled Meds:  amiodarone  200 mg Oral BID   apixaban  5 mg Oral BID   busPIRone  5 mg Oral BID   colchicine  0.3 mg Oral Daily   cycloSPORINE  1 drop Both Eyes BID   dexamethasone (DECADRON) injection  6 mg Intravenous Q24H   docusate sodium  100 mg Oral BID   DULoxetine  60 mg Oral Daily   fluticasone  2 spray Each Nare Daily   insulin aspart  0-5 Units Subcutaneous QHS   insulin aspart  0-9 Units Subcutaneous TID WC   loratadine  10 mg Oral Daily   mouth rinse  15 mL Mouth Rinse BID   metolazone  2.5 mg Oral Q M,W,F   metoprolol succinate  37.5 mg Oral Daily   molnupiravir EUA  4 capsule Oral BID   multivitamin with minerals  1 tablet Oral Daily   pantoprazole  40 mg Oral Daily   potassium chloride  40 mEq Oral BID   rosuvastatin  10 mg Oral QPM   sodium chloride flush  3 mL Intravenous Q12H   torsemide  60 mg Oral BID   Continuous Infusions:  sodium chloride 250 mL (08/27/21 2138)   PRN Meds: sodium chloride, acetaminophen **OR** acetaminophen, albuterol, ALPRAZolam, guaiFENesin-dextromethorphan, nitroGLYCERIN, sodium chloride flush   Vital Signs    Vitals:   08/30/21 2041 08/30/21 2338 08/31/21 0309 08/31/21 0635  BP: 139/74 116/70 118/66   Pulse: 82 77 74   Resp: (!) _0 Temp: 98.2 F (36.8 C) 98.6 F (37 C) 98.2 F (36.8 C)   TempSrc: Oral Oral Oral   SpO2: 96% 95% 94%    Weight:    123.1 kg  Height:        Intake/Output Summary (Last 24 hours) at 08/31/2021 0740 Last data filed at 08/31/2021 0439 Gross per 24 hour  Intake 410 ml  Output 2100 ml  Net -1690 ml   Last 3 Weights 08/31/2021 08/30/2021 08/28/2021  Weight (lbs) 271 lb 6.2 oz 280 lb 281 lb 15.5 oz  Weight (kg) 123.1 kg 127.007 kg 127.9 kg  Some encounter information is confidential and restricted. Go to Review Flowsheets activity to see all data.      Telemetry    Sinus rhythm, ventricular trigeminy - Personally Reviewed  ECG    No new tracings since 1/6 - Personally Reviewed  Physical Exam   GEN: No acute distress.   Neck: No JVD Cardiac: RRR, no murmurs, rubs, or gallops.  Respiratory: Rales at the lung bases GI: Soft,mild tenderness to palpation over abdomen, non-distended  MS: 1+ pitting edema in BLE, No deformity. Neuro:  Nonfocal  Psych: Normal affect   Labs    High Sensitivity Troponin:   Recent Labs  Lab 08/26/21 1215 08/26/21 1416  TROPONINIHS 10 10     Chemistry Recent Labs  Lab 08/29/21 0642 08/30/21 0531 08/31/21 0114  NA 136 137 134*  K 4.5  3.5 4.1  CL 100 98 96*  CO2 _0 GLUCOSE 107* 99 167*  BUN 32* 35* 43*  CREATININE 2.75* 2.66* 2.89*  CALCIUM 8.7* 8.5* 9.1  MG 2.2 2.1 2.1  PROT 7.0 7.2 7.9  ALBUMIN 2.6* 2.7* 2.8*  AST _1 ALT _2 ALKPHOS 149* 156* 161*  BILITOT 0.8 0.6 0.7  GFRNONAA 19* 19* 17*  ANIONGAP _3 Lipids No results for input(s): CHOL, TRIG, HDL, LABVLDL, LDLCALC, CHOLHDL in the last 168 hours.  Hematology Recent Labs  Lab 08/29/21 0642 08/30/21 0531 08/31/21 0114  WBC 4.6 4.1 3.4*  RBC 3.70* 3.80* 4.03  HGB 10.7* 10.8* 11.4*  HCT 32.8* 33.2* 34.2*  MCV 88.6 87.4 84.9  MCH 28.9 28.4 28.3  MCHC 32.6 32.5 33.3  RDW 14.5 14.2 13.3  PLT 142* 150 193   Thyroid No results for input(s): TSH, FREET4 in the last 168 hours.  BNP Recent Labs  Lab 08/29/21 0642 08/30/21 0531 08/31/21 0114  BNP  90.3 88.1 132.7*    DDimer No results for input(s): DDIMER in the last 168 hours.   Radiology    DG Chest Port 1 View  Result Date: 08/29/2021 CLINICAL DATA:  Dyspnea, COPD, CHF EXAM: PORTABLE CHEST 1 VIEW COMPARISON:  08/26/2021 chest radiograph. FINDINGS: Intact sternotomy wires. Tricuspid valve annuloplasty ring in place. Stable cardiomediastinal silhouette with mild to moderate cardiomegaly. No pneumothorax. No pleural effusion. No overt pulmonary edema. Minimal patchy peripheral right lung base opacity. IMPRESSION: 1. Stable mild-to-moderate cardiomegaly without overt pulmonary edema. 2. Minimal patchy peripheral right lung base opacity, cannot exclude mild aspiration or pneumonia. Chest radiograph follow-up advised. Electronically Signed   By: Ilona Sorrel M.D.   On: 08/29/2021 10:56    Cardiac Studies   Right Heart Cath 07/29/21  Findings:   RA = 5 RV = 36/8 PA = 40/11 (26) PCW = 13 Fick cardiac output/index = 4.3/1.9 PVR = 3.0 WU FA sat = 95% PA sat = 52%, 55%   Assessment: Normal filling pressures with mild PAH Moderately reduced cardiac out likely due in part to low fluid status   Echo 07/29/21  1. Left ventricular ejection fraction, by estimation, is 40 to 45%. The  left ventricle has mildly decreased function. The left ventricle  demonstrates global hypokinesis. There is mild left ventricular  hypertrophy. Left ventricular diastolic parameters  are indeterminate.   2. Right ventricular systolic function is mildly reduced. The right  ventricular size is normal. There is normal pulmonary artery systolic  pressure.   3. Left atrial size was moderately dilated.   4. Right atrial size was mildly dilated.   5. HR 90 bpm. MV peak gradient, 14.1 mmHg. The mean mitral valve gradient  is 6.5 mmHg      . There is a 30 mm prosthetic annuloplasty ring present in the mitral  position.   6. S/p repair with an annuloplasty ring.   7. The aortic valve is normal in structure.  Aortic valve regurgitation is  not visualized. No aortic stenosis is present.   8. The inferior vena cava is normal in size with greater than 50%  respiratory variability, suggesting right atrial pressure of 3 mmHg.   Patient Profile     66 y.o. 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  She presents after having elevated heart rates since 1/4. She also notes a 20 lb weight gain since her hospital discharge on 07/31/21. Found to be Covid positive on testing.  Assessment & Plan    Atrial Flutter - History of atrial fibrillation status post Maze procedure in May 2022, usual regiment of amiodarone 264m PO daily, eliquis 566mBID  - Patient was given IV amiodarone starting on 1/6 and converted to NSR. Transitioned to oral amiodarone on 1/8 - currently on amiodarone 20039mID - Per telemetry, patient remains in sinus rhythm. Plan to continue amiodarone PO with outpatient follow up for dose adjustment. Continue eliquis 5 mg BID  - Plan to arrange EP follow up for after discharge as patient has complex/atypical atrial fibrillation/flutter. Patient has previously been seen by Dr. LamQuentin Oreth consideration of AVN ablation + PPM going forward if needed   Acute on chronic systolic and diastolic heart failure: Echo on 07/29/21 showed LVEF 40-45% (similar to prior), LV global hypokinesis, mildly reduced LV/RV systolic function, moderately dilated LA  - Patient continues to have 1+ pitting edema and crackles in lung bases - Patient net -8.95 L fluids, weight has decreased from 135.2 kg on admission  to 123.1 kg (down 26.61 pounds since admission)  - Patient was taken off aldactone and K supplementation 1/9 as there was concerns about being on them with her chronic renal failure. K supplementation  ordered by internal medicine team on 1/10, currently on 40 mEq BID. Due to kidney function, will reduce K supplementation to once daily. Consider decreasing more, but will defer to MD  -Transitioned to PO demadex on 1/10, continue oral metolazone MWF (second dose due this morning). Closely monitor K, Mag, and creatinine. K increased to 4.1 (up from 3.5), mag stable at 2.1, creatinine 2.89 (baseline is around 2.6-3) - Continue  metoprolol succinate   MR/TR  - post repair with 30 mm annuloplasty ring, normal function on TTE 07/29/21    CKD stage IV  - Creatinine 2.89, baseline is around 2.6-3 - eGFR 17, baseline 17-20 - Closely monitor creatinine with patient on metolazone. Monitor K and Mag      COVID Positive - Treatment per primary team    PVCs: noted on telemetry  - Continue metoprolol succinate  - Hesitant to aggressively replete K given kidney function (K 4.1)     For questions or updates, please contact [facility] consult [email or web address] for contact info under        Signed, KatMargie BilletA-C  08/31/2021, 7:40 AM    Patient examined chart reviewed Residual cough from COVID volume status better maintaining NSR on amiodarone  CRF at baseline with home dosing of non K sparing diuretics demedex and zaroxyln.  Will arrange outpatient f/u with Dr ChrHarrell Gaveardiology will sign off   PetJenkins Rouge FACFresno Heart And Surgical Hospital

## 2021-08-31 NOTE — TOC Initial Note (Deleted)
Transition of Care [facility]) - Initial/Assessment Note    Patient Details  Name: [name] MRN: [id number] Date of Birth: 1956/06/21  Transition of Care [facility]) CM/SW Contact:    Angelita Ingles, RN Phone Number:[phone]  08/31/2021, 1:54 PM  Clinical Narrative:                 TOC consulted to follow patient with high risk for readmission. Patient states that she is from home with her daughter and grandchildren. Patient reports that she has wheelchair, cane and broken walker at home. TOC will need to order new walker if indicated.  Per patient she has been functional at home without difficulty. Patient confirms that her PCP is Juluis Mire and that she is active with her care. [facility] [address]. Patient reports that she does have transportation to appointments and is able to afford and obtain medications as ordered per MD. Patient does plan to return home with daughter upon discharge. Patient currently has no needs but TOC will continue to follow.   Expected Discharge Plan: Home/Self Care Barriers to Discharge: Continued Medical Work up   Patient Goals and CMS Choice Patient states their goals for this hospitalization and ongoing recovery are:: To feel better and be able to go home CMS Medicare.gov Compare Post Acute Care list provided to::  (n/a)    Expected Discharge Plan and Services Expected Discharge Plan: Home/Self Care In-house Referral: NA Discharge Planning Services: CM Consult Post Acute Care Choice: NA Living arrangements for the past 2 months: Apartment                 DME Arranged: N/A DME Agency: NA       HH Arranged: NA (no recommendation yet- pending) [place] Agency: NA        Prior Living Arrangements/Services Living arrangements for the past 2 months: Apartment Lives with:: Adult Children, Other (Comment) (and grandchildren) Patient language and need for interpreter reviewed:: Yes Do you feel safe going back to the place where you  live?: Yes      Need for Family Participation in Patient Care: Yes (Comment) Care giver support system in place?: Yes (comment) Current home services: DME (has wheelchair, cane walker that is broken) Criminal Activity/Legal Involvement Pertinent to Current Situation/Hospitalization: No - Comment as needed  Activities of Daily Living      Permission Sought/Granted   Permission granted to share information with : No              Emotional Assessment   Attitude/Demeanor/Rapport: Unable to Assess (spoke with patient on phone) Affect (typically observed): Unable to Assess (spoke with patient via phone) Orientation: : Oriented to Self, Oriented to Place, Oriented to  Time, Oriented to Situation Alcohol / Substance Use: Not Applicable Psych Involvement: No (comment)  Admission diagnosis:  Atrial flutter with rapid ventricular response (HCC) [I48.92] Atrial flutter, unspecified type [facility]) [I48.92] Patient Active Problem List   Diagnosis Date Noted   Atrial flutter with rapid ventricular response ([place]) 08/26/2021   CKD (chronic kidney disease) stage 4, GFR 15-29 ml/min ([place]) 08/26/2021   Anxiety and depression 08/26/2021   Essential hypertension 08/26/2021   GERD (gastroesophageal reflux disease) 08/26/2021   Hyperlipidemia 08/26/2021   COPD (chronic obstructive pulmonary disease) ([place]) 07/29/2021   Prolonged QT interval 12/87/8676   Acute systolic CHF (congestive heart failure) ([place]) 07/28/2021   Biventricular heart failure ([place])    Acute on chronic systolic heart failure ([place]) 02/25/2021   Atrial flutter ([place])  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   Encounter for preoperative dental examination    Teeth missing    Gingivitis    Accretions on teeth    CHF exacerbation ([place]) 12/21/2020   Acute on chronic heart failure ([place]) 12/19/2020   Atrial fibrillation, permanent ([place]) 11/02/2020   Acute on chronic combined systolic and diastolic  CHF (congestive heart failure) ([place]) 05/06/2020   NICM (nonischemic cardiomyopathy) ([place]) 04/01/2020   Family history of heart disease 04/01/2020   Mitral regurgitation 04/01/2020   Severe tricuspid regurgitation 04/01/2020   Closed right ankle fracture 09/24/2019   DKA, type 2 ([place]) 09/24/2019   HHNC (hyperglycemic hyperosmolar nonketotic coma) ([place]) 02/21/2019   Hypokalemia 02/21/2019   Acute kidney injury superimposed on CKD ([place]) 02/21/2019   Acute on chronic left systolic heart failure ([place]) 07/01/2018   Congestive heart failure with left ventricular diastolic dysfunction, acute ([place]) 07/01/2018   Right upper lobe pneumonia 05/14/2018   Atrial fibrillation with RVR ([place]) 05/14/2018   Chronic atrial fibrillation ([place]) 28/76/8115   Diastolic dysfunction 72/62/0355   Diabetes mellitus type 2 in obese ([place]) 12/26/2017   Nausea and vomiting    Chest pain 02/26/2012   OSA (obstructive sleep apnea) 09/21/2011   Hypoxia 09/21/2011   Diabetes mellitus ([place]) 02/04/2010   Morbid obesity ([place]) 02/04/2010   AF (paroxysmal atrial fibrillation) ([place]) 02/04/2010   Gastroparesis 02/04/2010   Acute gastroenteritis 02/04/2010   PCP:  [name], NP [facility]   [id number] [place] [address] [place] [address] Phone: [phone] Fax: [phone]  [facility] - [place], [address] [place] [address] Phone: [phone] Fax: [phone]     Social Determinants of Health (SDOH) Interventions    Readmission Risk Interventions Readmission Risk Prevention Plan 08/31/2021 02/10/2021 01/11/2021  Transportation Screening Complete Complete Complete  Medication Review (RN Care Manager) Referral to Pharmacy Complete -  PCP or Specialist appointment within 3-5 days of discharge Complete Complete Complete  HRI or Home Care Consult Complete Complete Complete  SW Recovery Care/Counseling  Consult Complete Complete Complete  Palliative Care Screening Not Applicable Not Applicable Not Applicable  Skilled Nursing Facility Not Applicable Not Applicable Not Applicable  Some recent data might be hidden

## 2021-08-31 NOTE — Progress Notes (Signed)
Mobility Specialist Progress Note    08/31/21 1438  Mobility  Activity Ambulated in hall  Level of Assistance Standby assist, set-up cues, supervision of patient - no hands on  Assistive Device Four wheel walker  Distance Ambulated (ft) 165 ft  Mobility Ambulated with assistance in hallway  Mobility Response Tolerated fair  Mobility performed by Mobility specialist  Bed Position Chair  $Mobility charge 1 Mobility   Pt received in bed and agreeable. C/o some lightheadedness during walk. Took no breaks. Returned to walk with call bell in reach.   [facility] Mobility Specialist  M.S. 2C and 6E: [phone] M.S. 4E: [phone]

## 2021-08-31 NOTE — Plan of Care (Signed)
°  Problem: Education: Goal: Knowledge of disease or condition will improve Outcome: Progressing   Problem: Activity: Goal: Ability to tolerate increased activity will improve Outcome: Progressing   Problem: Cardiac: Goal: Ability to achieve and maintain adequate cardiopulmonary perfusion will improve Outcome: Progressing   Problem: Education: Goal: Knowledge of General Education information will improve Description: Including pain rating scale, medication(s)/side effects and non-pharmacologic comfort measures Outcome: Progressing   Problem: Health Behavior/Discharge Planning: Goal: Ability to manage health-related needs will improve Outcome: Progressing   Problem: Clinical Measurements: Goal: Diagnostic test results will improve Outcome: Progressing Goal: Respiratory complications will improve Outcome: Progressing   Problem: Activity: Goal: Risk for activity intolerance will decrease Outcome: Progressing   Problem: Pain Managment: Goal: General experience of comfort will improve Outcome: Progressing

## 2021-08-31 NOTE — Progress Notes (Signed)
Inpatient Diabetes Program Recommendations  AACE/ADA: New Consensus Statement on Inpatient Glycemic Control (2015)  Target Ranges:  Prepandial:   less than 140 mg/dL      Peak postprandial:   less than 180 mg/dL (1-2 hours)      Critically ill patients:  140 - 180 mg/dL   Lab Results  Component Value Date   GLUCAP 182 (H) 08/31/2021   HGBA1C 6.5 (H) 08/27/2021    Review of Glycemic Control  Latest Reference Range & Units 08/30/21 11:53 08/30/21 17:23 08/30/21 21:16 08/31/21 06:26  Glucose-Capillary 70 - 99 mg/dL 178 (H) 176 (H) 312 (H) 182 (H)   Diabetes history: DM 2 Outpatient Diabetes medications: Humalog 7-00 units tid, Trulicity weekly Current orders for Inpatient glycemic control:  Novolog sensitive tid with meals and HS Decadron 6 mg daily  Inpatient Diabetes Program Recommendations:    Consider adding Levemir 5 units bid and Novolog meal coverage 3 units tid with meals (hold if patient eats less than 50% or NPO) while on steroids.   Thanks,  Adah Perl, RN, BC-ADM Inpatient Diabetes Coordinator Pager [phone]  (8a-5p)

## 2021-08-31 NOTE — Plan of Care (Signed)
°  Problem: Activity: Goal: Ability to tolerate increased activity will improve Outcome: Progressing   Problem: Clinical Measurements: Goal: Ability to maintain clinical measurements within normal limits will improve Outcome: Progressing Goal: Will remain free from infection Outcome: Progressing   Problem: Activity: Goal: Risk for activity intolerance will decrease Outcome: Progressing

## 2021-08-31 NOTE — Plan of Care (Signed)
°  Problem: Education: Goal: Knowledge of disease or condition will improve Outcome: Progressing Goal: Understanding of medication regimen will improve Outcome: Progressing   Problem: Activity: Goal: Ability to tolerate increased activity will improve Outcome: Progressing   Problem: Education: Goal: Knowledge of General Education information will improve Description: Including pain rating scale, medication(s)/side effects and non-pharmacologic comfort measures Outcome: Progressing   Problem: Health Behavior/Discharge Planning: Goal: Ability to manage health-related needs will improve Outcome: Progressing   Problem: Clinical Measurements: Goal: Will remain free from infection Outcome: Progressing

## 2021-09-01 LAB — GLUCOSE, CAPILLARY
Glucose-Capillary: 161 mg/dL — ABNORMAL HIGH (ref 70–99)
Glucose-Capillary: 144 mg/dL — ABNORMAL HIGH (ref 70–99)
Glucose-Capillary: 321 mg/dL — ABNORMAL HIGH (ref 70–99)

## 2021-09-01 LAB — C-REACTIVE PROTEIN: CRP: 2 mg/dL — ABNORMAL HIGH (ref <1.0)

## 2021-09-01 MED ORDER — DIPHENHYDRAMINE HCL 25 MG PO CAPS
25.0000 mg | ORAL_CAPSULE | Freq: Once | ORAL | Status: AC
  Administered 2021-09-01: 25 mg via ORAL
  Filled 2021-09-01: qty 1

## 2021-09-01 NOTE — Progress Notes (Signed)
Patient ID: [name], female   DOB: June 22, 1956, 66 y.o.   MRN: [id number]  PROGRESS NOTE    [name]  HER:[id number] DOB: April 12, 1956 DOA: 08/26/2021 PCP: [name], NP   Brief Narrative:  66 y.o. female with medical history significant of PAF on  eliquis with hx of multiple cardioversions, severe MR/TR,  E5UD, chronic diastolic/systolic CHF, CKD stage IV, severe MR s/p mitral valve replacement in 12/2020, OSA, COPD who presented to ED with chest pain and shortness of breath.  On presentation, she was found to be in A. fib with RVR along with acute on chronic systolic CHF and admitted to the hospital on IV Lasix.  Cardiology was consulted for her work-up further showed she also had incidental COVID-19 infection.  Assessment & Plan:   Acute hypoxic respiratory failure Acute on chronic systolic heart failure -Respiratory status improving.  Currently on room air. -Initially treated with IV Lasix.  Currently already on oral torsemide -EF improved to 45% from 20% in the past. -Cardiology signed off on 08/31/2021: Continue metoprolol succinate, torsemide and metolazone.  Outpatient follow-up with cardiology. -Strict input and output.  Daily weights.  Fluid restriction.  Negative balance of 8706.6 cc since admission.  COVID-19 infection -Currently on room air. -Monitor daily inflammatory markers COVID-19 Labs  Recent Labs    08/30/21 0531 08/31/21 0114 09/01/21 0127  CRP 7.0* 5.6* 2.0*     Lab Results  Component Value Date   SARSCOV2NAA POSITIVE (A) 08/26/2021   SARSCOV2NAA NEGATIVE 07/28/2021   SARSCOV2NAA NEGATIVE 05/02/2021   [place] NEGATIVE 02/25/2021   -Started on IV Decadron and oral molnupiravir on 08/30/2021  Paroxysmal A. fib with RVR -Currently rate controlled.  Continue oral amiodarone and Eliquis.  Outpatient follow-up with cardiology  Severe MR status post repair/severe TR -Cardiology following.  COPD -Currently  stable.  Hyperlipidemia - continue statin  Hypokalemia -Improved.  GERD -Continue PPI  CKD stage IV -Baseline creatinine around 2.6-3.  Creatinine 2.89 on 08/31/2020.  Repeat a.m. labs.  Diabetes mellitus type 2 -Continue CBGs with SSI   DVT prophylaxis: Eliquis Code Status: Full Family Communication: None at bedside Disposition Plan: Status is: Inpatient  Remains inpatient appropriate because: Of severity of illness  Consultants: Cardiology  Procedures: Echo  Antimicrobials: None   Subjective: Patient seen and examined at bedside.  Still feels slightly short of breath with exertion.  Still does not feel ready to go home today.  No overnight fever, vomiting, worsening chest pain reported. Objective: Vitals:   09/01/21 0350 09/01/21 0445 09/01/21 0811 09/01/21 0904  BP: (!) 141/86  125/88   Pulse: 75  66 65  Resp: 19  15   Temp: 97.7 F (36.5 C)  97.7 F (36.5 C)   TempSrc: Oral  Oral   SpO2: 95%  95%   Weight:  123 kg    Height:        Intake/Output Summary (Last 24 hours) at 09/01/2021 1022 Last data filed at 09/01/2021 0855 Gross per 24 hour  Intake 540 ml  Output 675 ml  Net -135 ml    Filed Weights   08/30/21 0704 08/31/21 0635 09/01/21 0445  Weight: 127 kg 123.1 kg 123 kg    Examination:  General exam: On room air currently.  No acute distress.  Looks chronically ill and deconditioned. Respiratory system: Decreased breath sounds at bases bilaterally with some scattered crackles  cardiovascular system: Rate controlled; S1-S2 heard gastrointestinal system: Abdomen is distended slightly; soft and nontender.  Bowel sounds are  heard  extremities: Mild lower extremity edema present; no cyanosis  Central nervous system: Awake and alert.  Still slow to respond.  No focal neurological deficits.  Moves extremities Skin: No obvious ecchymosis/rashes  psychiatry: Flat affect   Data Reviewed: I have personally reviewed following labs and imaging  studies  CBC: Recent Labs  Lab 08/27/21 0232 08/28/21 0113 08/29/21 0642 08/30/21 0531 08/31/21 0114  WBC 3.9* 4.8 4.6 4.1 3.4*  NEUTROABS  --  2.2 2.2 1.4* 2.0  HGB 11.4* 10.8* 10.7* 10.8* 11.4*  HCT 35.6* 34.3* 32.8* 33.2* 34.2*  MCV 90.8 90.7 88.6 87.4 84.9  PLT 133* 138* 142* 150 510    Basic Metabolic Panel: Recent Labs  Lab 08/26/21 1416 08/27/21 0232 08/27/21 1304 08/28/21 0113 08/29/21 0642 08/30/21 0531 08/31/21 0114  NA  --  139  --  136 136 137 134*  K  --  3.2* 4.4 4.6 4.5 3.5 4.1  CL  --  101  --  101 100 98 96*  CO2  --  28  --  28 27 29 27   GLUCOSE  --  137*  --  147* 107* 99 167*  BUN  --  27*  --  31* 32* 35* 43*  CREATININE  --  2.62*  --  2.86* 2.75* 2.66* 2.89*  CALCIUM  --  8.5*  --  8.5* 8.7* 8.5* 9.1  MG 2.1  --   --  2.8* 2.2 2.1 2.1    GFR: Estimated Creatinine Clearance: 26.4 mL/min (A) (by C-G formula based on SCr of 2.89 mg/dL (H)). Liver Function Tests: Recent Labs  Lab 08/26/21 1215 08/28/21 0113 08/29/21 0642 08/30/21 0531 08/31/21 0114  AST 41 34 24 23 24   ALT 41 33 27 24 23   ALKPHOS 175* 141* 149* 156* 161*  BILITOT 0.5 0.5 0.8 0.6 0.7  PROT 7.8 6.9 7.0 7.2 7.9  ALBUMIN 3.2* 2.7* 2.6* 2.7* 2.8*    Recent Labs  Lab 08/26/21 1215  LIPASE 25    No results for input(s): AMMONIA in the last 168 hours. Coagulation Profile: No results for input(s): INR, PROTIME in the last 168 hours. Cardiac Enzymes: No results for input(s): CKTOTAL, CKMB, CKMBINDEX, TROPONINI in the last 168 hours. BNP (last 3 results) No results for input(s): PROBNP in the last 8760 hours. HbA1C: No results for input(s): HGBA1C in the last 72 hours. CBG: Recent Labs  Lab 08/31/21 0626 08/31/21 1101 08/31/21 1621 08/31/21 2118 09/01/21 0619  GLUCAP 182* 157* 203* 347* 161*    Lipid Profile: No results for input(s): CHOL, HDL, LDLCALC, TRIG, CHOLHDL, LDLDIRECT in the last 72 hours. Thyroid Function Tests: No results for input(s): TSH,  T4TOTAL, FREET4, T3FREE, THYROIDAB in the last 72 hours. Anemia Panel: No results for input(s): VITAMINB12, FOLATE, FERRITIN, TIBC, IRON, RETICCTPCT in the last 72 hours. Sepsis Labs: No results for input(s): PROCALCITON, LATICACIDVEN in the last 168 hours.  Recent Results (from the past 240 hour(s))  Resp Panel by RT-PCR (Flu A&B, Covid) Nasopharyngeal Swab     Status: Abnormal   Collection Time: 08/26/21  3:34 PM   Specimen: Nasopharyngeal Swab; Nasopharyngeal(NP) swabs in vial transport medium  Result Value Ref Range Status   SARS Coronavirus 2 by RT PCR POSITIVE (A) NEGATIVE Final    Comment: (NOTE) SARS-CoV-2 target nucleic acids are DETECTED.  The SARS-CoV-2 RNA is generally detectable in upper respiratory specimens during the acute phase of infection. Positive results are indicative of the presence of the identified virus, but do  not rule out bacterial infection or co-infection with other pathogens not detected by the test. Clinical correlation with patient history and other diagnostic information is necessary to determine patient infection status. The expected result is Negative.  Fact Sheet for Patients: EntrepreneurPulse.com.au  Fact Sheet for Healthcare Providers: IncredibleEmployment.be  This test is not yet approved or cleared by the [place] FDA and  has been authorized for detection and/or diagnosis of SARS-CoV-2 by FDA under an Emergency Use Authorization (EUA).  This EUA will remain in effect (meaning this test can be used) for the duration of  the COVID-19 declaration under Section 564(b)(1) of the A ct, 21 U.S.C. section 360bbb-3(b)(1), unless the authorization is terminated or revoked sooner.     Influenza A by PCR NEGATIVE NEGATIVE Final   Influenza B by PCR NEGATIVE NEGATIVE Final    Comment: (NOTE) The Xpert Xpress SARS-CoV-2/FLU/RSV plus assay is intended as an aid in the diagnosis of influenza from  Nasopharyngeal swab specimens and should not be used as a sole basis for treatment. Nasal washings and aspirates are unacceptable for Xpert Xpress SARS-CoV-2/FLU/RSV testing.  Fact Sheet for Patients: EntrepreneurPulse.com.au  Fact Sheet for Healthcare Providers: IncredibleEmployment.be  This test is not yet approved or cleared by the [place] FDA and has been authorized for detection and/or diagnosis of SARS-CoV-2 by FDA under an Emergency Use Authorization (EUA). This EUA will remain in effect (meaning this test can be used) for the duration of the COVID-19 declaration under Section 564(b)(1) of the Act, 21 U.S.C. section 360bbb-3(b)(1), unless the authorization is terminated or revoked.  Performed at [facility], [place] [address] [place], [place] [address]           Radiology Studies: No results found.      Scheduled Meds:  amiodarone  200 mg Oral BID   apixaban  5 mg Oral BID   busPIRone  5 mg Oral BID   colchicine  0.3 mg Oral Daily   cycloSPORINE  1 drop Both Eyes BID   dexamethasone (DECADRON) injection  6 mg Intravenous Q24H   docusate sodium  100 mg Oral BID   DULoxetine  60 mg Oral Daily   fluticasone  2 spray Each Nare Daily   insulin aspart  0-5 Units Subcutaneous QHS   insulin aspart  0-9 Units Subcutaneous TID WC   loratadine  10 mg Oral Daily   mouth rinse  15 mL Mouth Rinse BID   metolazone  2.5 mg Oral Q M,W,F   metoprolol succinate  37.5 mg Oral Daily   molnupiravir EUA  4 capsule Oral BID   multivitamin with minerals  1 tablet Oral Daily   pantoprazole  40 mg Oral Daily   potassium chloride  40 mEq Oral Daily   rosuvastatin  10 mg Oral QPM   sodium chloride flush  3 mL Intravenous Q12H   torsemide  60 mg Oral BID   Continuous Infusions:  sodium chloride 250 mL (08/27/21 2138)          Aline August, MD [facility] 09/01/2021, 10:22 AM

## 2021-09-01 NOTE — Plan of Care (Signed)
°  Problem: Education: Goal: Knowledge of disease or condition will improve Outcome: Progressing Goal: Understanding of medication regimen will improve Outcome: Progressing   Problem: Cardiac: Goal: Ability to achieve and maintain adequate cardiopulmonary perfusion will improve Outcome: Progressing   Problem: Health Behavior/Discharge Planning: Goal: Ability to safely manage health-related needs after discharge will improve Outcome: Progressing   Problem: Education: Goal: Knowledge of General Education information will improve Description: Including pain rating scale, medication(s)/side effects and non-pharmacologic comfort measures Outcome: Progressing   Problem: Health Behavior/Discharge Planning: Goal: Ability to manage health-related needs will improve Outcome: Progressing   Problem: Clinical Measurements: Goal: Ability to maintain clinical measurements within normal limits will improve Outcome: Progressing Goal: Diagnostic test results will improve Outcome: Progressing Goal: Cardiovascular complication will be avoided Outcome: Progressing   Problem: Activity: Goal: Risk for activity intolerance will decrease Outcome: Progressing

## 2021-09-01 NOTE — Progress Notes (Addendum)
Physical Therapy Treatment Patient Details Name: [name] MRN: [id number] DOB: 08-01-1956 Today's Date: 09/01/2021   History of Present Illness Pt is a 66 y.o. female admitted 08/26/21 with chest pain, SOB. Workup for hypoxic respiratory failure secondary to acute on chronic CHF, afib with RVR; incidental (+) COVID-19. PMH includes CHF, CKD IV, severe MR, OSA, COPD, asthma, DVT, OSA, DM2, afib on Eliquis.   PT Comments    Pt progressing with mobility. Today's session focused on ambulation for improving strength and activity tolerance; noted improvements in endurance, stability and breathing. Encouraged more frequent OOB activity. Will continue to follow acutely.    Recommendations for follow up therapy are one component of a multi-disciplinary discharge planning process, led by the attending physician.  Recommendations may be updated based on patient status, additional functional criteria and insurance authorization.  Follow Up Recommendations  No PT follow up     Assistance Recommended at Discharge PRN  Patient can return home with the following Assistance with cooking/housework;Assist for transportation   Equipment Recommendations  None recommended by PT (will borrow walker from family member)   Recommendations for Other Services       Precautions / Restrictions Precautions Precautions: Fall Restrictions Weight Bearing Restrictions: No     Mobility  Bed Mobility Overal bed mobility: Independent             General bed mobility comments: return to supine with bed flat    Transfers Overall transfer level: Modified independent Equipment used: Rolling walker (2 wheels)               General transfer comment: declined use of SPC, requesting RW    Ambulation/Gait Ambulation/Gait assistance: Supervision Gait Distance (Feet): 152 Feet Assistive device: Rolling walker (2 wheels) Gait Pattern/deviations: Step-through pattern;Decreased stride  length;Trunk flexed Gait velocity: Decreased     General Gait Details: Slow, steady gait with RW and supervision for safety/lines; 2x brief standing rest breaks secondary to fatigue and SOB; pt requesting use of RW instead of SPC; declined further mobility due to fatigue   Stairs             Wheelchair Mobility    Modified Rankin (Stroke Patients Only)       Balance Overall balance assessment: Needs assistance   Sitting balance-Leahy Scale: Good     Standing balance support: No upper extremity supported;During functional activity Standing balance-Leahy Scale: Fair                              Cognition Arousal/Alertness: Awake/alert Behavior During Therapy: WFL for tasks assessed/performed;Flat affect Overall Cognitive Status: Within Functional Limits for tasks assessed                                          Exercises      General Comments General comments (skin integrity, edema, etc.): reports she can borrow RW from family member, that is what she plans on using at home for now      Pertinent Vitals/Pain Pain Assessment: Faces Faces Pain Scale: No hurt Pain Intervention(s): Monitored during session    Home Living                          Prior Function            PT Goals (  current goals can now be found in the care plan section) Progress towards PT goals: Progressing toward goals    Frequency    Min 3X/week      PT Plan Current plan remains appropriate    Co-evaluation              AM-PAC PT "6 Clicks" Mobility   Outcome Measure  Help needed turning from your back to your side while in a flat bed without using bedrails?: None Help needed moving from lying on your back to sitting on the side of a flat bed without using bedrails?: None Help needed moving to and from a bed to a chair (including a wheelchair)?: None Help needed standing up from a chair using your arms (e.g., wheelchair or  bedside chair)?: None Help needed to walk in hospital room?: A Little Help needed climbing 3-5 steps with a railing? : A Little 6 Click Score: 22    End of Session   Activity Tolerance: Patient tolerated treatment well Patient left: in bed;with call bell/phone within reach Nurse Communication: Mobility status PT Visit Diagnosis: Other abnormalities of gait and mobility (R26.89)     Time: 0254-2706 PT Time Calculation (min) (ACUTE ONLY): 16 min  Charges:  $Therapeutic Exercise: 8-22 mins                     Mabeline Caras, PT, DPT Acute Rehabilitation Services  Pager [phone] Office [place] 09/01/2021, 5:02 PM

## 2021-09-01 NOTE — Progress Notes (Signed)
Inpatient Diabetes Program Recommendations  AACE/ADA: New Consensus Statement on Inpatient Glycemic Control (2015)  Target Ranges:  Prepandial:   less than 140 mg/dL      Peak postprandial:   less than 180 mg/dL (1-2 hours)      Critically ill patients:  140 - 180 mg/dL   Lab Results  Component Value Date   GLUCAP 161 (H) 09/01/2021   HGBA1C 6.5 (H) 08/27/2021    Review of Glycemic Control  Latest Reference Range & Units 08/31/21 16:21 08/31/21 21:18 09/01/21 06:19  Glucose-Capillary 70 - 99 mg/dL 203 (H) 347 (H) 161 (H)  Diabetes history: DM 2 Outpatient Diabetes medications: Humalog 3-29 units tid, Trulicity weekly Current orders for Inpatient glycemic control:  Novolog sensitive tid with meals and HS Decadron 6 mg daily   Inpatient Diabetes Program Recommendations:     Consider adding Novolog meal coverage 3 units tid with meals (hold if patient eats less than 50% or NPO) while on steroids.   Thanks, Bronson Curb, MSN, RNC-OB Diabetes Coordinator [phone] (8a-5p)

## 2021-09-01 NOTE — TOC Progression Note (Signed)
Transition of Care [facility]) - Progression Note    Patient Details  Name: [name] MRN: [id number] Date of Birth: Oct 10, 1955  Transition of Care [facility]) CM/SW [place], RN Phone Number:[phone]  09/01/2021, 2:02 PM  Clinical Narrative:    CM made aware per nursing staff that patient will need new rolling walker. Cm has notified [employer] to make them aware. Per [employer] the patient does not qualify for another walker because she just got one in 2021 and insurance will only pay every 5 years. [employer] will notify patient and advise her of her options for obtaining a new walker. TOC will continue to follow for any other needs.   Expected Discharge Plan: Home/Self Care Barriers to Discharge: Continued Medical Work up  Expected Discharge Plan and Services Expected Discharge Plan: Home/Self Care In-house Referral: NA Discharge Planning Services: CM Consult Post Acute Care Choice: NA Living arrangements for the past 2 months: Apartment                 DME Arranged: Walker rolling DME Agency: [employer] Date DME Agency Contacted: 09/01/21 Time DME Agency Contacted: 1349 Representative spoke with at DME Agency: Adela Lank HH Arranged: NA (no recommendation yet- pending) [place] Agency: NA         Social Determinants of Health (SDOH) Interventions    Readmission Risk Interventions Readmission Risk Prevention Plan 08/31/2021 02/10/2021 01/11/2021  Transportation Screening Complete Complete Complete  Medication Review Press photographer) Referral to Pharmacy Complete -  PCP or Specialist appointment within 3-5 days of discharge Complete Complete Complete  HRI or Home Care Consult Complete Complete Complete  SW Recovery Care/Counseling Consult Complete Complete Complete  Palliative Care Screening Not Applicable Not Applicable Not [place] Not Applicable Not Applicable Not Applicable  Some recent data might be hidden

## 2021-09-01 NOTE — TOC Initial Note (Signed)
Transition of Care [facility]) - Initial/Assessment Note    Patient Details  Name: [name] MRN: [id number] Date of Birth: 07-Feb-1956  Transition of Care [facility]) CM/SW Contact:    Angelita Ingles, RN Phone Number:[phone]  09/01/2021, 2:01 PM  Clinical Narrative:                 TOC consulted to follow patient with high risk for readmission. Patient states that she is from home with her daughter and grandchildren. Patient reports that she has wheelchair, cane and broken walker at home. TOC will need to order new walker if indicated.  Per patient she has been functional at home without difficulty. Patient confirms that her PCP is Juluis Mire and that she is active with her care. [facility] [address]. Patient reports that she does have transportation to appointments and is able to afford and obtain medications as ordered per MD. Patient does plan to return home with daughter upon discharge. Patient currently has no needs but TOC will continue to follow.   Expected Discharge Plan: Home/Self Care Barriers to Discharge: Continued Medical Work up   Patient Goals and CMS Choice Patient states their goals for this hospitalization and ongoing recovery are:: To feel better and be able to go home CMS Medicare.gov Compare Post Acute Care list provided to::  (n/a)    Expected Discharge Plan and Services Expected Discharge Plan: Home/Self Care In-house Referral: NA Discharge Planning Services: CM Consult Post Acute Care Choice: NA Living arrangements for the past 2 months: Apartment                 DME Arranged: Walker rolling DME Agency: [employer] Date DME Agency Contacted: 09/01/21 Time DME Agency Contacted: 1349 Representative spoke with at DME Agency: [place]: NA (no recommendation yet- pending) [place] Agency: NA        Prior Living Arrangements/Services Living arrangements for the past 2 months: Apartment Lives with:: Adult Children, Other (Comment)  (and grandchildren) Patient language and need for interpreter reviewed:: Yes Do you feel safe going back to the place where you live?: Yes      Need for Family Participation in Patient Care: Yes (Comment) Care giver support system in place?: Yes (comment) Current home services: DME (has wheelchair, cane walker that is broken) Criminal Activity/Legal Involvement Pertinent to Current Situation/Hospitalization: No - Comment as needed  Activities of Daily Living      Permission Sought/Granted   Permission granted to share information with : No              Emotional Assessment   Attitude/Demeanor/Rapport: Unable to Assess (spoke with patient on phone) Affect (typically observed): Unable to Assess (spoke with patient via phone) Orientation: : Oriented to Self, Oriented to Place, Oriented to  Time, Oriented to Situation Alcohol / Substance Use: Not Applicable Psych Involvement: No (comment)  Admission diagnosis:  Atrial flutter with rapid ventricular response (HCC) [I48.92] Atrial flutter, unspecified type [facility]) [I48.92] Patient Active Problem List   Diagnosis Date Noted   Atrial flutter with rapid ventricular response ([place]) 08/26/2021   CKD (chronic kidney disease) stage 4, GFR 15-29 ml/min ([place]) 08/26/2021   Anxiety and depression 08/26/2021   Essential hypertension 08/26/2021   GERD (gastroesophageal reflux disease) 08/26/2021   Hyperlipidemia 08/26/2021   COPD (chronic obstructive pulmonary disease) ([place]) 07/29/2021   Prolonged QT interval 48/08/6551   Acute systolic CHF (congestive heart failure) ([place]) 07/28/2021   Biventricular heart failure ([place])    Acute on  chronic systolic heart failure ([place]) 02/25/2021   Atrial flutter ([place])    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   Encounter for preoperative dental examination    Teeth missing    Gingivitis    Accretions on teeth    CHF exacerbation ([place]) 12/21/2020   Acute on chronic heart  failure ([place]) 12/19/2020   Atrial fibrillation, permanent ([place]) 11/02/2020   Acute on chronic combined systolic and diastolic CHF (congestive heart failure) ([place]) 05/06/2020   NICM (nonischemic cardiomyopathy) ([place]) 04/01/2020   Family history of heart disease 04/01/2020   Mitral regurgitation 04/01/2020   Severe tricuspid regurgitation 04/01/2020   Closed right ankle fracture 09/24/2019   DKA, type 2 ([place]) 09/24/2019   HHNC (hyperglycemic hyperosmolar nonketotic coma) ([place]) 02/21/2019   Hypokalemia 02/21/2019   Acute kidney injury superimposed on CKD ([place]) 02/21/2019   Acute on chronic left systolic heart failure ([place]) 07/01/2018   Congestive heart failure with left ventricular diastolic dysfunction, acute ([place]) 07/01/2018   Right upper lobe pneumonia 05/14/2018   Atrial fibrillation with RVR ([place]) 05/14/2018   Chronic atrial fibrillation ([place]) 63/89/3734   Diastolic dysfunction 28/76/8115   Diabetes mellitus type 2 in obese ([place]) 12/26/2017   Nausea and vomiting    Chest pain 02/26/2012   OSA (obstructive sleep apnea) 09/21/2011   Hypoxia 09/21/2011   Diabetes mellitus ([place]) 02/04/2010   Morbid obesity ([place]) 02/04/2010   AF (paroxysmal atrial fibrillation) ([place]) 02/04/2010   Gastroparesis 02/04/2010   Acute gastroenteritis 02/04/2010   PCP:  [name], NP [facility]   [id number] [place] [address] [place] [place] [address] Phone: [phone] Fax: [phone]  [facility] - [place], [address] [address] Phone: [phone] Fax: [phone]     Social Determinants of Health (SDOH) Interventions    Readmission Risk Interventions Readmission Risk Prevention Plan 08/31/2021 02/10/2021 01/11/2021  Transportation Screening Complete Complete Complete  Medication Review (RN Care Manager) Referral to Pharmacy Complete -  PCP or Specialist appointment within 3-5  days of discharge Complete Complete Complete  HRI or Home Care Consult Complete Complete Complete  SW Recovery Care/Counseling Consult Complete Complete Complete  Palliative Care Screening Not Applicable Not Applicable Not Applicable  Skilled Nursing Facility Not Applicable Not Applicable Not Applicable  Some recent data might be hidden

## 2021-09-01 NOTE — Progress Notes (Signed)
Mobility Specialist: Progress Note   09/01/21 1204  Mobility  Activity Ambulated in room  Level of Assistance Independent after set-up  Assistive Device Front wheel walker  Distance Ambulated (ft) 100 ft  Mobility Ambulated with assistance in room  Mobility Response Tolerated well  Mobility performed by Mobility specialist  Bed Position Chair  $Mobility charge 1 Mobility   Post-Mobility: 76 HR, 96% SpO2  Pt received in bed and agreeable to mobility. C/o chest pain during mobility, no rating given. Pt to chair after session with all needs met.    [facility] Requan Hardge Mobility Specialist Mobility Specialist 4 [place]: [phone] Mobility Specialist 2 [place] and [place]: [phone]

## 2021-09-02 ENCOUNTER — Other Ambulatory Visit: Payer: Self-pay | Admitting: Physician Assistant

## 2021-09-02 ENCOUNTER — Inpatient Hospital Stay (HOSPITAL_COMMUNITY): Payer: 59

## 2021-09-02 DIAGNOSIS — N179 Acute kidney failure, unspecified: Secondary | ICD-10-CM

## 2021-09-02 LAB — BASIC METABOLIC PANEL
Anion gap: 11 (ref 5–15)
BUN: 70 mg/dL — ABNORMAL HIGH (ref 8–23)
CO2: 29 mmol/L (ref 22–32)
Calcium: 9 mg/dL (ref 8.9–10.3)
Chloride: 91 mmol/L — ABNORMAL LOW (ref 98–111)
Creatinine, Ser: 3.28 mg/dL — ABNORMAL HIGH (ref 0.44–1.00)
GFR, Estimated: 15 mL/min — ABNORMAL LOW (ref >60)
Glucose, Bld: 281 mg/dL — ABNORMAL HIGH (ref 70–99)
Potassium: 3.5 mmol/L (ref 3.5–5.1)
Sodium: 131 mmol/L — ABNORMAL LOW (ref 135–145)

## 2021-09-02 LAB — GLUCOSE, CAPILLARY
Glucose-Capillary: 389 mg/dL — ABNORMAL HIGH (ref 70–99)
Glucose-Capillary: 229 mg/dL — ABNORMAL HIGH (ref 70–99)
Glucose-Capillary: 165 mg/dL — ABNORMAL HIGH (ref 70–99)
Glucose-Capillary: 188 mg/dL — ABNORMAL HIGH (ref 70–99)
Glucose-Capillary: 370 mg/dL — ABNORMAL HIGH (ref 70–99)

## 2021-09-02 LAB — TROPONIN I (HIGH SENSITIVITY)
Troponin I (High Sensitivity): 8 ng/L (ref <18)
Troponin I (High Sensitivity): 7 ng/L (ref <18)

## 2021-09-02 LAB — C-REACTIVE PROTEIN: CRP: 0.8 mg/dL (ref <1.0)

## 2021-09-02 LAB — MAGNESIUM: Magnesium: 2.2 mg/dL (ref 1.7–2.4)

## 2021-09-02 MED ORDER — TORSEMIDE 20 MG PO TABS: 60.0000 mg | ORAL_TABLET | Freq: Two times a day (BID) | ORAL | 0 refills | Status: DC

## 2021-09-02 MED ORDER — ROSUVASTATIN CALCIUM 10 MG PO TABS: 10.0000 mg | ORAL_TABLET | Freq: Every evening | ORAL | Status: DC

## 2021-09-02 MED ORDER — PANTOPRAZOLE SODIUM 40 MG PO TBEC: 40.0000 mg | DELAYED_RELEASE_TABLET | Freq: Every day | ORAL | Status: DC

## 2021-09-02 MED ORDER — MOLNUPIRAVIR EUA 200MG CAPSULE: 4.0000 | ORAL_CAPSULE | Freq: Two times a day (BID) | ORAL | 0 refills | Status: DC

## 2021-09-02 MED ORDER — AMIODARONE HCL 200 MG PO TABS: 200.0000 mg | ORAL_TABLET | Freq: Two times a day (BID) | ORAL | 0 refills | Status: DC

## 2021-09-02 MED ORDER — POTASSIUM CHLORIDE CRYS ER 20 MEQ PO TBCR: 40.0000 meq | EXTENDED_RELEASE_TABLET | Freq: Every day | ORAL | Status: DC

## 2021-09-02 MED ORDER — METOLAZONE 2.5 MG PO TABS: 2.5000 mg | ORAL_TABLET | ORAL | 0 refills | Status: DC

## 2021-09-02 MED ORDER — BENZONATATE 200 MG PO CAPS: 200.0000 mg | ORAL_CAPSULE | Freq: Two times a day (BID) | ORAL | 0 refills | Status: DC | PRN

## 2021-09-02 MED ORDER — NOVOLOG 100 UNIT/ML ~~LOC~~ SOLN: 2.0000 [IU] | Freq: Three times a day (TID) | SUBCUTANEOUS | Status: DC

## 2021-09-02 MED ORDER — DEXAMETHASONE 6 MG PO TABS: 6.0000 mg | ORAL_TABLET | Freq: Every day | ORAL | 0 refills | Status: DC

## 2021-09-02 MED ORDER — GUAIFENESIN-DM 100-10 MG/5ML PO SYRP: 10.0000 mL | ORAL_SOLUTION | ORAL | 1 refills | Status: DC | PRN

## 2021-09-02 NOTE — Progress Notes (Signed)
RN was called to room by NT b/c pt was on toilet and was unresponsive. Pt appeared to have vagal response during bowel movement. When RN reached pt, pt was responsive, but with slow responses, diaphoretic, and was weak. Nt and SWOT RN used lift to get pt to bed. Rapid response was at bedside. Vital signs normal and blood sugar was not low. This RN paged MD and MD said to continue to monitor. Pt was "feeling better" after laying in bed for a while but said she had some chest tightness that she had experienced previously. RN had NT get EKG. RN notified MD and MD wrote in secure chat to give pt sublingual NG. RN administered med and is continuing to monitor pt. Pt sat up to eat and appears much better than she did previously.  RN will continue to monitor.

## 2021-09-02 NOTE — Discharge Summary (Signed)
Physician Discharge Summary  [name] XBM:[id number] DOB: 11/29/1955 DOA: 08/26/2021  PCP: [name], NP  Admit date: 08/26/2021 Discharge date: 09/02/2021  Admitted From: Home Disposition: Home  Recommendations for Outpatient Follow-up:  Follow up with PCP in 1 week with repeat CBC/BMP Outpatient follow-up with cardiology Recommend outpatient evaluation and follow-up by nephrology Follow up in ED if symptoms worsen or new appear   Home Health: No Equipment/Devices: None  Discharge Condition: Stable CODE STATUS: Full Diet recommendation: Heart healthy/carb modified/fluid restriction of up to 1200 cc a day  Brief/Interim Summary: 66 y.o. female with medical history significant of PAF on  eliquis with hx of multiple cardioversions, severe MR/TR,  U2VO, chronic diastolic/systolic CHF, CKD stage IV, severe MR s/p mitral valve replacement in 12/2020, OSA, COPD who presented to ED with chest pain and shortness of breath.  On presentation, she was found to be in A. fib with RVR along with acute on chronic systolic CHF and admitted to the hospital on IV Lasix.  Cardiology was consulted for her work-up further showed she also had incidental COVID-19 infection.  She was subsequently treated with IV Decadron and oral molnupiravir from 08/30/2021.  Overall condition has improved.  Cardiology has signed off.  Patient is currently on room air.  She will be discharged home today with close outpatient follow-up with PCP and cardiology.  Discharge Diagnoses:   Acute hypoxic respiratory failure Acute on chronic systolic heart failure -Respiratory status improving.  Currently on room air. -Initially treated with IV Lasix.  Currently already on oral torsemide -EF improved to 45% from 20% in the past. -Cardiology signed off on 08/31/2021: Continue metoprolol succinate, Outpatient follow-up with cardiology.  We will hold torsemide and metolazone on discharge because of elevated creatinine  today.  Currently on torsemide 60 mg twice daily and metolazone 2.5 mg on Monday/Wednesday/Friday.  Resume torsemide 60 mg twice a day from 09/04/2021 and metolazone from 09/05/2021 with close outpatient follow-up of BMP and follow-up with cardiology. -Continue fluid and diet restriction.  Negative balance of 12,291.6 cc since admission.   COVID-19 infection -Currently on room air. COVID-19 Labs  Recent Labs    08/31/21 0114 09/01/21 0127 09/02/21 0129  CRP 5.6* 2.0* 0.8    Lab Results  Component Value Date   SARSCOV2NAA POSITIVE (A) 08/26/2021   SARSCOV2NAA NEGATIVE 07/28/2021   [place] NEGATIVE 05/02/2021   [place] NEGATIVE 02/25/2021      -Started on IV Decadron and oral molnupiravir on 08/30/2021.  Discharge home today on oral Decadron to finish total of 10-day course of therapy.  Continue oral molnupiravir to finish 5-day course of therapy. -Complete 10-day course of isolation upon discharge   Paroxysmal A. fib with RVR -Currently rate controlled.  Continue oral amiodarone and Eliquis.  Outpatient follow-up with cardiology   Severe MR status post repair/severe TR -Outpatient follow-up with cardiology.   COPD -Currently stable.  Hyperlipidemia - continue statin  Hypokalemia -Improved.   GERD -Continue PPI   Acute kidney injury on CKD stage IV -Baseline creatinine around 2.6-3.  Creatinine 3.28 today.  Outpatient follow-up.  Diuretic plan as above.  Diabetes mellitus type 2 -Carb modified diet.  Continue outpatient regimen  Morbid obesity  -Outpatient follow-up Discharge Instructions  Discharge Instructions     Ambulatory referral to Cardiology   Complete by: As directed    Diet - low sodium heart healthy   Complete by: As directed    Diet Carb Modified   Complete by: As directed  Increase activity slowly   Complete by: As directed       Allergies as of 09/02/2021       Reactions   Bee Pollen Anaphylaxis   Sulfa Antibiotics Itching         Medication List     STOP taking these medications    ALPRAZolam 0.5 MG tablet Commonly known as: XANAX   amoxicillin-clavulanate 875-125 MG tablet Commonly known as: AUGMENTIN       TAKE these medications    Accu-Chek Guide Me w/Device Kit Use as instructed to check blood sugar three times daily. E11.69   Accu-Chek Guide test strip Generic drug: glucose blood Use to check blood sugar TID. E11.69   acetaminophen 500 MG tablet Commonly known as: TYLENOL Take 1,000 mg by mouth every 6 (six) hours as needed for mild pain.   albuterol 108 (90 Base) MCG/ACT inhaler Commonly known as: VENTOLIN HFA Inhale 2 puffs into the lungs every 4 (four) hours as needed for wheezing or shortness of breath (Asthma).   amiodarone 200 MG tablet Commonly known as: PACERONE Take 1 tablet (200 mg total) by mouth 2 (two) times daily. What changed: See the new instructions.   apixaban 5 MG Tabs tablet Commonly known as: Eliquis Take 1 tablet (5 mg total) by mouth 2 (two) times daily.   B-D SINGLE USE SWABS REGULAR Pads 1 each by Other route daily.   benzonatate 200 MG capsule Commonly known as: TESSALON Take 1 capsule (200 mg total) by mouth 2 (two) times daily as needed for cough.   blood glucose meter kit and supplies Dispense based on patient and insurance preference. Use up to four times daily as directed. (FOR ICD-10 E10.9, E11.9).   blood glucose meter kit and supplies Kit Dispense based on patient and insurance preference. Use up to four times daily as directed.   busPIRone 5 MG tablet Commonly known as: BUSPAR TAKE 1 TABLET BY MOUTH TWICE A DAY   colchicine 0.6 MG tablet Take 0.3 mg by mouth daily.   dexamethasone 6 MG tablet Commonly known as: DECADRON Take 1 tablet (6 mg total) by mouth daily for 7 days.   docusate sodium 100 MG capsule Commonly known as: COLACE Take 100 mg by mouth 2 (two) times daily.   DULoxetine 60 MG capsule Commonly known as:  CYMBALTA Take 1 capsule (60 mg total) by mouth daily.   EPINEPHrine 0.3 mg/0.3 mL Soaj injection Commonly known as: EPI-PEN Inject 0.3 mg into the muscle as needed for anaphylaxis.   fluticasone 50 MCG/ACT nasal spray Commonly known as: FLONASE Place 2 sprays into both nostrils daily.   guaiFENesin-dextromethorphan 100-10 MG/5ML syrup Commonly known as: ROBITUSSIN DM Take 10 mLs by mouth every 4 (four) hours as needed for cough.   loratadine 10 MG tablet Commonly known as: CLARITIN Take 1 tablet (10 mg total) by mouth daily.   metolazone 2.5 MG tablet Commonly known as: ZAROXOLYN Take 1 tablet (2.5 mg total) by mouth every Monday, Wednesday, and Friday. Take every Monday and Friday Start taking on: September 05, 2021 What changed: when to take this   metoprolol succinate 25 MG 24 hr tablet Commonly known as: Toprol XL Take 1.5 tablets (37.5 mg total) by mouth daily.   molnupiravir EUA 200 mg Caps capsule Commonly known as: LAGEVRIO Take 4 capsules (800 mg total) by mouth 2 (two) times daily.   MULTIVITAMIN WOMEN PO Take 1 tablet by mouth daily.   nitroGLYCERIN 0.4 MG SL tablet Commonly known  as: NITROSTAT Place 1 tablet (0.4 mg total) under the tongue every 5 (five) minutes x 3 doses as needed for chest pain.   NovoLOG 100 UNIT/ML injection Generic drug: insulin aspart Inject 2-10 Units into the skin 3 (three) times daily before meals. Per sliding scale   pantoprazole 40 MG tablet Commonly known as: PROTONIX Take 1 tablet (40 mg total) by mouth daily.   potassium chloride SA 20 MEQ tablet Commonly known as: KLOR-CON M Take 2 tablets (40 mEq total) by mouth daily. Take an additional 20 meq with every dose of METOLAZONE   Restasis 0.05 % ophthalmic emulsion Generic drug: cycloSPORINE Place 1 drop into both eyes 2 (two) times daily.   rosuvastatin 10 MG tablet Commonly known as: CRESTOR Take 1 tablet (10 mg total) by mouth every evening.   torsemide 20 MG  tablet Commonly known as: DEMADEX Take 3 tablets (60 mg total) by mouth 2 (two) times daily. Start taking on: September 04, 2021 What changed:  medication strength how much to take when to take this These instructions start on September 04, 2021. If you are unsure what to do until then, ask your doctor or other care provider.   Trulicity 1.5 NG/8.7JL Sopn Generic drug: Dulaglutide INJECT 1.5 MG INTO THE SKIN ONCE A WEEK.               Durable Medical Equipment  (From admission, onward)           Start     Ordered   09/01/21 1359  For home use only DME Walker rolling  Once       Question Answer Comment  Walker: With [place] Wheels   Patient needs a walker to treat with the following condition Weakness      09/01/21 1358            Follow-up Information     [name], NP. Schedule an appointment as soon as possible for a visit in 1 week(s).   Specialty: Internal Medicine Why: With repeat CBC/BMP Contact information: [address] [place] [address] [phone]         Buford Dresser, MD .   Specialty: Cardiology Contact information: [address] [place] [address] [phone]         Vickie Epley, MD .   Specialties: Cardiology, Radiology Contact information: [place] [address] [phone]         [place], [place]. Schedule an appointment as soon as possible for a visit in 1 week(s).   Contact information: [place] [address] [phone]                Allergies  Allergen Reactions   Bee Pollen Anaphylaxis   Sulfa Antibiotics Itching    Consultations: Cardiology   Procedures/Studies: DG Chest 2 View  Result Date: 08/26/2021 CLINICAL DATA:  cp EXAM: CHEST - 2 VIEW COMPARISON:  December 2022 FINDINGS: Cardiomegaly. Left atrial appendage clip present. Median sternotomy wires. No pleural effusion. No pneumothorax. No overt edema or  lobar consolidation. IMPRESSION: Cardiomegaly.  No overt edema or effusion. Electronically Signed   By: Albin Felling M.D.   On: 08/26/2021 13:15   DG Chest Port 1 View  Result Date: 08/29/2021 CLINICAL DATA:  Dyspnea, COPD, CHF EXAM: PORTABLE CHEST 1 VIEW COMPARISON:  08/26/2021 chest radiograph. FINDINGS: Intact sternotomy wires. Tricuspid valve annuloplasty ring in place. Stable cardiomediastinal silhouette with mild to moderate cardiomegaly. No pneumothorax.  No pleural effusion. No overt pulmonary edema. Minimal patchy peripheral right lung base opacity. IMPRESSION: 1. Stable mild-to-moderate cardiomegaly without overt pulmonary edema. 2. Minimal patchy peripheral right lung base opacity, cannot exclude mild aspiration or pneumonia. Chest radiograph follow-up advised. Electronically Signed   By: Ilona Sorrel M.D.   On: 08/29/2021 10:56      Subjective: Patient seen and examined at bedside.  Still complains of intermittent cough but feels okay to go home today.  Denies worsening chest pain, nausea, vomiting or diarrhea.  Discharge Exam: Vitals:   09/02/21 0723 09/02/21 0934  BP: 108/90   Pulse: 82 81  Resp: 15   Temp: (!) 97.5 F (36.4 C)   SpO2: 93%     General: Pt is alert, awake, not in acute distress.  Currently on room air. Cardiovascular: rate controlled, S1/S2 + Respiratory: bilateral decreased breath sounds at bases with some scattered crackles Abdominal: Soft, morbidly obese, NT, ND, bowel sounds + Extremities: Trace lower extremity edema; no cyanosis    The results of significant diagnostics from this hospitalization (including imaging, microbiology, ancillary and laboratory) are listed below for reference.     Microbiology: Recent Results (from the past 240 hour(s))  Resp Panel by RT-PCR (Flu A&B, Covid) Nasopharyngeal Swab     Status: Abnormal   Collection Time: 08/26/21  3:34 PM   Specimen: Nasopharyngeal Swab; Nasopharyngeal(NP) swabs in vial transport medium   Result Value Ref Range Status   SARS Coronavirus 2 by RT PCR POSITIVE (A) NEGATIVE Final    Comment: (NOTE) SARS-CoV-2 target nucleic acids are DETECTED.  The SARS-CoV-2 RNA is generally detectable in upper respiratory specimens during the acute phase of infection. Positive results are indicative of the presence of the identified virus, but do not rule out bacterial infection or co-infection with other pathogens not detected by the test. Clinical correlation with patient history and other diagnostic information is necessary to determine patient infection status. The expected result is Negative.  Fact Sheet for Patients: EntrepreneurPulse.com.au  Fact Sheet for Healthcare Providers: IncredibleEmployment.be  This test is not yet approved or cleared by the [place] FDA and  has been authorized for detection and/or diagnosis of SARS-CoV-2 by FDA under an Emergency Use Authorization (EUA).  This EUA will remain in effect (meaning this test can be used) for the duration of  the COVID-19 declaration under Section 564(b)(1) of the A ct, 21 U.S.C. section 360bbb-3(b)(1), unless the authorization is terminated or revoked sooner.     Influenza A by PCR NEGATIVE NEGATIVE Final   Influenza B by PCR NEGATIVE NEGATIVE Final    Comment: (NOTE) The Xpert Xpress SARS-CoV-2/FLU/RSV plus assay is intended as an aid in the diagnosis of influenza from Nasopharyngeal swab specimens and should not be used as a sole basis for treatment. Nasal washings and aspirates are unacceptable for Xpert Xpress SARS-CoV-2/FLU/RSV testing.  Fact Sheet for Patients: EntrepreneurPulse.com.au  Fact Sheet for Healthcare Providers: IncredibleEmployment.be  This test is not yet approved or cleared by the [place] FDA and has been authorized for detection and/or diagnosis of SARS-CoV-2 by FDA under an Emergency Use Authorization (EUA).  This EUA will remain in effect (meaning this test can be used) for the duration of the COVID-19 declaration under Section 564(b)(1) of the Act, 21 U.S.C. section 360bbb-3(b)(1), unless the authorization is terminated or revoked.  Performed at [facility], [place] [address], [place], [place] [address]      Labs: BNP (last 3 results) Recent Labs  08/29/21 0642 08/30/21 0531 08/31/21 0114  BNP 90.3 88.1 878.6*   Basic Metabolic Panel: Recent Labs  Lab 08/28/21 0113 08/29/21 0642 08/30/21 0531 08/31/21 0114 09/02/21 0129  NA 136 136 137 134* 131*  K 4.6 4.5 3.5 4.1 3.5  CL 101 100 98 96* 91*  CO2 $Re'28 27 29 27 29  'Hlw$ GLUCOSE 147* 107* 99 167* 281*  BUN 31* 32* 35* 43* 70*  CREATININE 2.86* 2.75* 2.66* 2.89* 3.28*  CALCIUM 8.5* 8.7* 8.5* 9.1 9.0  MG 2.8* 2.2 2.1 2.1 2.2   Liver Function Tests: Recent Labs  Lab 08/26/21 1215 08/28/21 0113 08/29/21 0642 08/30/21 0531 08/31/21 0114  AST 41 34 $Rem'24 23 24  'DXoJ$ ALT 41 33 $Rem'27 24 23  'PIlb$ ALKPHOS 175* 141* 149* 156* 161*  BILITOT 0.5 0.5 0.8 0.6 0.7  PROT 7.8 6.9 7.0 7.2 7.9  ALBUMIN 3.2* 2.7* 2.6* 2.7* 2.8*   Recent Labs  Lab 08/26/21 1215  LIPASE 25   No results for input(s): AMMONIA in the last 168 hours. CBC: Recent Labs  Lab 08/27/21 0232 08/28/21 0113 08/29/21 0642 08/30/21 0531 08/31/21 0114  WBC 3.9* 4.8 4.6 4.1 3.4*  NEUTROABS  --  2.2 2.2 1.4* 2.0  HGB 11.4* 10.8* 10.7* 10.8* 11.4*  HCT 35.6* 34.3* 32.8* 33.2* 34.2*  MCV 90.8 90.7 88.6 87.4 84.9  PLT 133* 138* 142* 150 193   Cardiac Enzymes: No results for input(s): CKTOTAL, CKMB, CKMBINDEX, TROPONINI in the last 168 hours. BNP: Invalid input(s): POCBNP CBG: Recent Labs  Lab 09/01/21 0619 09/01/21 1311 09/01/21 1640 09/01/21 2123 09/02/21 0612  GLUCAP 161* 144* 321* 389* 229*   D-Dimer No results for input(s): DDIMER in the last 72 hours. Hgb A1c No results for input(s): HGBA1C in the last 72 hours. Lipid Profile No results for input(s):  CHOL, HDL, LDLCALC, TRIG, CHOLHDL, LDLDIRECT in the last 72 hours. Thyroid function studies No results for input(s): TSH, T4TOTAL, T3FREE, THYROIDAB in the last 72 hours.  Invalid input(s): FREET3 Anemia work up No results for input(s): VITAMINB12, FOLATE, FERRITIN, TIBC, IRON, RETICCTPCT in the last 72 hours. Urinalysis    Component Value Date/Time   COLORURINE YELLOW 08/27/2021 0026   APPEARANCEUR CLEAR 08/27/2021 0026   LABSPEC 1.015 08/27/2021 0026   PHURINE 6.0 08/27/2021 0026   GLUCOSEU NEGATIVE 08/27/2021 0026   HGBUR SMALL (A) 08/27/2021 0026   BILIRUBINUR NEGATIVE 08/27/2021 0026   BILIRUBINUR negative 09/24/2019 1742   KETONESUR NEGATIVE 08/27/2021 0026   PROTEINUR NEGATIVE 08/27/2021 0026   UROBILINOGEN 1.0 09/24/2019 1742   UROBILINOGEN 1.0 10/26/2014 0249   NITRITE NEGATIVE 08/27/2021 0026   LEUKOCYTESUR NEGATIVE 08/27/2021 0026   Sepsis Labs Invalid input(s): PROCALCITONIN,  WBC,  LACTICIDVEN Microbiology Recent Results (from the past 240 hour(s))  Resp Panel by RT-PCR (Flu A&B, Covid) Nasopharyngeal Swab     Status: Abnormal   Collection Time: 08/26/21  3:34 PM   Specimen: Nasopharyngeal Swab; Nasopharyngeal(NP) swabs in vial transport medium  Result Value Ref Range Status   SARS Coronavirus 2 by RT PCR POSITIVE (A) NEGATIVE Final    Comment: (NOTE) SARS-CoV-2 target nucleic acids are DETECTED.  The SARS-CoV-2 RNA is generally detectable in upper respiratory specimens during the acute phase of infection. Positive results are indicative of the presence of the identified virus, but do not rule out bacterial infection or co-infection with other pathogens not detected by the test. Clinical correlation with patient history and other diagnostic information is necessary to determine patient infection status. The expected result is  Negative.  Fact Sheet for Patients: EntrepreneurPulse.com.au  Fact Sheet for Healthcare  Providers: IncredibleEmployment.be  This test is not yet approved or cleared by the [place] FDA and  has been authorized for detection and/or diagnosis of SARS-CoV-2 by FDA under an Emergency Use Authorization (EUA).  This EUA will remain in effect (meaning this test can be used) for the duration of  the COVID-19 declaration under Section 564(b)(1) of the A ct, 21 U.S.C. section 360bbb-3(b)(1), unless the authorization is terminated or revoked sooner.     Influenza A by PCR NEGATIVE NEGATIVE Final   Influenza B by PCR NEGATIVE NEGATIVE Final    Comment: (NOTE) The Xpert Xpress SARS-CoV-2/FLU/RSV plus assay is intended as an aid in the diagnosis of influenza from Nasopharyngeal swab specimens and should not be used as a sole basis for treatment. Nasal washings and aspirates are unacceptable for Xpert Xpress SARS-CoV-2/FLU/RSV testing.  Fact Sheet for Patients: EntrepreneurPulse.com.au  Fact Sheet for Healthcare Providers: IncredibleEmployment.be  This test is not yet approved or cleared by the [place] FDA and has been authorized for detection and/or diagnosis of SARS-CoV-2 by FDA under an Emergency Use Authorization (EUA). This EUA will remain in effect (meaning this test can be used) for the duration of the COVID-19 declaration under Section 564(b)(1) of the Act, 21 U.S.C. section 360bbb-3(b)(1), unless the authorization is terminated or revoked.  Performed at [facility], [place] [address] [place],  [address]      Time coordinating discharge: 35 minutes  SIGNED:   Aline August, MD  [facility] 09/02/2021, 11:20 AM

## 2021-09-02 NOTE — Care Management Important Message (Signed)
Important Message  Patient Details  Name: Timica Marcom MRN: [id number] Date of Birth: 03/11/1956   Medicare Important Message Given:  Yes     Shelda Altes 09/02/2021, 1:10 PM

## 2021-09-02 NOTE — Significant Event (Signed)
Rapid Response Event Note   Reason for Call :  Pt was having bowel movement on the commode when she became unresponsive, pulse present.   Initial Focused Assessment:  Pt assisted back to bed via [employer] .  Pt alert, oriented, slow to respond. Skin is cool, moist, pink. Pt in no distress. Skin non-tenting. Mucous membranes are pink.   VS: T 97.51F, BP 101/70, HR 86, RR 17, SpO2 100% on room air CBG: 165  Interventions:  -No intervention from RR RN  Plan of Care:  -Orthostatics and mobility prior to discharge this afternoon -Encourage oral intake  Call rapid response for additional needs  Event Summary:  MD Notified: Dr. Starla Link Call Time: [place] Time: 1155 End Time: [place], RN

## 2021-09-03 ENCOUNTER — Encounter (HOSPITAL_COMMUNITY): Payer: Self-pay | Admitting: Family Medicine

## 2021-09-03 ENCOUNTER — Inpatient Hospital Stay (HOSPITAL_COMMUNITY): Payer: 59

## 2021-09-03 LAB — BASIC METABOLIC PANEL
Anion gap: 13 (ref 5–15)
BUN: 93 mg/dL — ABNORMAL HIGH (ref 8–23)
CO2: 27 mmol/L (ref 22–32)
Calcium: 9.2 mg/dL (ref 8.9–10.3)
Chloride: 90 mmol/L — ABNORMAL LOW (ref 98–111)
Creatinine, Ser: 3.7 mg/dL — ABNORMAL HIGH (ref 0.44–1.00)
GFR, Estimated: 13 mL/min — ABNORMAL LOW (ref >60)
Glucose, Bld: 374 mg/dL — ABNORMAL HIGH (ref 70–99)
Potassium: 3.7 mmol/L (ref 3.5–5.1)
Sodium: 130 mmol/L — ABNORMAL LOW (ref 135–145)

## 2021-09-03 LAB — CBC WITH DIFFERENTIAL/PLATELET
Abs Immature Granulocytes: 0.04 10*3/uL (ref 0.00–0.07)
Basophils Absolute: 0 10*3/uL (ref 0.0–0.1)
Basophils Relative: 0 %
Eosinophils Absolute: 0 10*3/uL (ref 0.0–0.5)
Eosinophils Relative: 0 %
HCT: 38.6 % (ref 36.0–46.0)
Hemoglobin: 12.9 g/dL (ref 12.0–15.0)
Immature Granulocytes: 1 %
Lymphocytes Relative: 17 %
Lymphs Abs: 1.4 10*3/uL (ref 0.7–4.0)
MCH: 28 pg (ref 26.0–34.0)
MCHC: 33.4 g/dL (ref 30.0–36.0)
MCV: 83.7 fL (ref 80.0–100.0)
Monocytes Absolute: 0.7 10*3/uL (ref 0.1–1.0)
Monocytes Relative: 8 %
Neutro Abs: 6.2 10*3/uL (ref 1.7–7.7)
Neutrophils Relative %: 74 %
Platelets: 305 10*3/uL (ref 150–400)
RBC: 4.61 MIL/uL (ref 3.87–5.11)
RDW: 13 % (ref 11.5–15.5)
WBC: 8.2 10*3/uL (ref 4.0–10.5)
nRBC: 0 % (ref 0.0–0.2)

## 2021-09-03 LAB — GLUCOSE, CAPILLARY
Glucose-Capillary: 245 mg/dL — ABNORMAL HIGH (ref 70–99)
Glucose-Capillary: 245 mg/dL — ABNORMAL HIGH (ref 70–99)
Glucose-Capillary: 292 mg/dL — ABNORMAL HIGH (ref 70–99)
Glucose-Capillary: 410 mg/dL — ABNORMAL HIGH (ref 70–99)
Glucose-Capillary: 363 mg/dL — ABNORMAL HIGH (ref 70–99)

## 2021-09-03 LAB — MAGNESIUM: Magnesium: 2.3 mg/dL (ref 1.7–2.4)

## 2021-09-03 MED ORDER — INSULIN ASPART 100 UNIT/ML IJ SOLN
3.0000 [IU] | Freq: Three times a day (TID) | INTRAMUSCULAR | Status: DC
  Administered 2021-09-03 – 2021-09-04 (×2): 3 [IU] via SUBCUTANEOUS

## 2021-09-03 MED ORDER — SODIUM CHLORIDE 0.9 % IV SOLN: INTRAVENOUS | Status: DC

## 2021-09-03 MED ORDER — INSULIN ASPART 100 UNIT/ML IJ SOLN
15.0000 [IU] | Freq: Once | INTRAMUSCULAR | Status: AC
  Administered 2021-09-03: 15 [IU] via SUBCUTANEOUS

## 2021-09-03 MED ORDER — SODIUM CHLORIDE 0.9 % IV BOLUS
500.0000 mL | Freq: Once | INTRAVENOUS | Status: AC
  Administered 2021-09-03: 500 mL via INTRAVENOUS

## 2021-09-03 NOTE — Progress Notes (Signed)
Patient ID: [name], female   DOB: Sep 15, 1955, 66 y.o.   MRN: [id number]  PROGRESS NOTE    [name]  JIR:[id number] DOB: 15-Oct-1955 DOA: 08/26/2021 PCP: [name], NP   Brief Narrative:  66 y.o. female with medical history significant of PAF on  eliquis with hx of multiple cardioversions, severe MR/TR,  B5ZW, chronic diastolic/systolic CHF, CKD stage IV, severe MR s/p mitral valve replacement in 12/2020, OSA, COPD who presented to ED with chest pain and shortness of breath.  On presentation, she was found to be in A. fib with RVR along with acute on chronic systolic CHF and admitted to the hospital on IV Lasix.  Cardiology was consulted for her work-up further showed she also had incidental COVID-19 infection.  Patient was transitioned to oral diuretics and subsequently cardiology signed off.  She was supposed to be discharged home on 09/02/2021 but patient most likely had a vasovagal event while having a bowel movement and subsequently did not feel comfortable going home.  Discharge was held on 09/02/2021.  Assessment & Plan:   Acute hypoxic respiratory failure Acute on chronic systolic heart failure -Respiratory status improving.  Currently on room air. -Initially treated with IV Lasix.  Currently already on oral torsemide -EF improved to 45% from 20% in the past. -Cardiology signed off on 08/31/2021: Continue metoprolol succinate. Outpatient follow-up with cardiology. -Strict input and output.  Daily weights.  Fluid restriction.  Negative balance of 12,995.6 cc since admission. -Torsemide and metolazone held on 09/02/2021 because of worsening renal function.  Continue to hold diuretics.  COVID-19 infection -Currently on room air. -Monitor daily inflammatory markers COVID-19 Labs  Recent Labs    09/01/21 0127 09/02/21 0129  CRP 2.0* 0.8     Lab Results  Component Value Date   SARSCOV2NAA POSITIVE (A) 08/26/2021   [place] NEGATIVE 07/28/2021    [place] NEGATIVE 05/02/2021   [place] NEGATIVE 02/25/2021   -Started on IV Decadron and oral molnupiravir on 08/30/2021.  AKI on CKD stage IV -Baseline creatinine around 2.6-3.  Creatinine continues to worsen to 3.70 today.  Diuretic plan as above.  Consult nephrology/Dr. Jonnie Finner.  Follow recommendations.  Repeat a.m. labs.  Hyponatremia -Mild.  Monitor.  Paroxysmal A. fib with RVR -Currently rate controlled.  Continue oral amiodarone and Eliquis.  Outpatient follow-up with cardiology  Severe MR status post repair/severe TR -Cardiology following.  Diabetes mellitus type 2 with hyperglycemia -Blood sugars on the higher side.  Continue CBGs with SSI.  Start NovoLog with meals.  COPD -Currently stable.  Hyperlipidemia - continue statin  Hypokalemia -Improved.  GERD -Continue PPI   DVT prophylaxis: Eliquis Code Status: Full Family Communication: None at bedside Disposition Plan: Status is: Inpatient  Remains inpatient appropriate because: Of severity of illness and worsening renal function needing nephrology evaluation.  Consultants: Cardiology  Procedures: Echo  Antimicrobials: None   Subjective: Patient seen and examined at bedside.  Feels okay but still feels weak.  Complains of intermittent cough and shortness of breath.  No overnight fever or chest pain reported.   Objective: Vitals:   09/03/21 0755 09/03/21 0800 09/03/21 0832 09/03/21 0900  BP: 120/80 112/66 (!) 124/105 119/90  Pulse: 81 85 85   Resp: 15 (!) 21  20  Temp: 97.8 F (36.6 C)     TempSrc: Oral     SpO2: 92% 95%    Weight:      Height:        Intake/Output Summary (Last 24 hours) at 09/03/2021 1046  Last data filed at 09/03/2021 0800 Gross per 24 hour  Intake 836 ml  Output 1300 ml  Net -464 ml    Filed Weights   09/01/21 0445 09/02/21 0423 09/03/21 0648  Weight: 123 kg 122.7 kg 121.4 kg    Examination:  General exam: No distress.  Currently on room air.  Looks  chronically ill and deconditioned. Respiratory system: Bilateral decreased breath sounds at bases with some crackles cardiovascular system: S1-S2 heard; currently rate controlled gastrointestinal system: Abdomen is mildly distended; soft and nontender.  Normal bowel sounds heard extremities: No clubbing; lower extremity edema present bilaterally Central nervous system: Awake and oriented.  Still still slow to respond.  No focal neurological deficits.  Moving extremities Skin: No obvious petechiae/lesions psychiatry: Affect is flat.  No signs of agitation.   Data Reviewed: I have personally reviewed following labs and imaging studies  CBC: Recent Labs  Lab 08/28/21 0113 08/29/21 0642 08/30/21 0531 08/31/21 0114 09/03/21 0104  WBC 4.8 4.6 4.1 3.4* 8.2  NEUTROABS 2.2 2.2 1.4* 2.0 6.2  HGB 10.8* 10.7* 10.8* 11.4* 12.9  HCT 34.3* 32.8* 33.2* 34.2* 38.6  MCV 90.7 88.6 87.4 84.9 83.7  PLT 138* 142* 150 193 347    Basic Metabolic Panel: Recent Labs  Lab 08/29/21 0642 08/30/21 0531 08/31/21 0114 09/02/21 0129 09/03/21 0104  NA 136 137 134* 131* 130*  K 4.5 3.5 4.1 3.5 3.7  CL 100 98 96* 91* 90*  CO2 27 29 27 29 27   GLUCOSE 107* 99 167* 281* 374*  BUN 32* 35* 43* 70* 93*  CREATININE 2.75* 2.66* 2.89* 3.28* 3.70*  CALCIUM 8.7* 8.5* 9.1 9.0 9.2  MG 2.2 2.1 2.1 2.2 2.3    GFR: Estimated Creatinine Clearance: 20.5 mL/min (A) (by C-G formula based on SCr of 3.7 mg/dL (H)). Liver Function Tests: Recent Labs  Lab 08/28/21 0113 08/29/21 0642 08/30/21 0531 08/31/21 0114  AST 34 24 23 24   ALT 33 27 24 23   ALKPHOS 141* 149* 156* 161*  BILITOT 0.5 0.8 0.6 0.7  PROT 6.9 7.0 7.2 7.9  ALBUMIN 2.7* 2.6* 2.7* 2.8*    No results for input(s): LIPASE, AMYLASE in the last 168 hours.  No results for input(s): AMMONIA in the last 168 hours. Coagulation Profile: No results for input(s): INR, PROTIME in the last 168 hours. Cardiac Enzymes: No results for input(s): CKTOTAL, CKMB,  CKMBINDEX, TROPONINI in the last 168 hours. BNP (last 3 results) No results for input(s): PROBNP in the last 8760 hours. HbA1C: No results for input(s): HGBA1C in the last 72 hours. CBG: Recent Labs  Lab 09/02/21 1146 09/02/21 1622 09/02/21 2118 09/03/21 0628 09/03/21 0758  GLUCAP 165* 188* 370* 245* 245*    Lipid Profile: No results for input(s): CHOL, HDL, LDLCALC, TRIG, CHOLHDL, LDLDIRECT in the last 72 hours. Thyroid Function Tests: No results for input(s): TSH, T4TOTAL, FREET4, T3FREE, THYROIDAB in the last 72 hours. Anemia Panel: No results for input(s): VITAMINB12, FOLATE, FERRITIN, TIBC, IRON, RETICCTPCT in the last 72 hours. Sepsis Labs: No results for input(s): PROCALCITON, LATICACIDVEN in the last 168 hours.  Recent Results (from the past 240 hour(s))  Resp Panel by RT-PCR (Flu A&B, Covid) Nasopharyngeal Swab     Status: Abnormal   Collection Time: 08/26/21  3:34 PM   Specimen: Nasopharyngeal Swab; Nasopharyngeal(NP) swabs in vial transport medium  Result Value Ref Range Status   SARS Coronavirus 2 by RT PCR POSITIVE (A) NEGATIVE Final    Comment: (NOTE) SARS-CoV-2 target nucleic acids  are DETECTED.  The SARS-CoV-2 RNA is generally detectable in upper respiratory specimens during the acute phase of infection. Positive results are indicative of the presence of the identified virus, but do not rule out bacterial infection or co-infection with other pathogens not detected by the test. Clinical correlation with patient history and other diagnostic information is necessary to determine patient infection status. The expected result is Negative.  Fact Sheet for Patients: EntrepreneurPulse.com.au  Fact Sheet for Healthcare Providers: IncredibleEmployment.be  This test is not yet approved or cleared by the [place] FDA and  has been authorized for detection and/or diagnosis of SARS-CoV-2 by FDA under an Emergency Use  Authorization (EUA).  This EUA will remain in effect (meaning this test can be used) for the duration of  the COVID-19 declaration under Section 564(b)(1) of the A ct, 21 U.S.C. section 360bbb-3(b)(1), unless the authorization is terminated or revoked sooner.     Influenza A by PCR NEGATIVE NEGATIVE Final   Influenza B by PCR NEGATIVE NEGATIVE Final    Comment: (NOTE) The Xpert Xpress SARS-CoV-2/FLU/RSV plus assay is intended as an aid in the diagnosis of influenza from Nasopharyngeal swab specimens and should not be used as a sole basis for treatment. Nasal washings and aspirates are unacceptable for Xpert Xpress SARS-CoV-2/FLU/RSV testing.  Fact Sheet for Patients: EntrepreneurPulse.com.au  Fact Sheet for Healthcare Providers: IncredibleEmployment.be  This test is not yet approved or cleared by the [place] FDA and has been authorized for detection and/or diagnosis of SARS-CoV-2 by FDA under an Emergency Use Authorization (EUA). This EUA will remain in effect (meaning this test can be used) for the duration of the COVID-19 declaration under Section 564(b)(1) of the Act, 21 U.S.C. section 360bbb-3(b)(1), unless the authorization is terminated or revoked.  Performed at [facility], [place] [address] [place], [place] [address]           Radiology Studies: DG CHEST PORT 1 VIEW  Result Date: 09/02/2021 CLINICAL DATA:  Dyspnea.  Asthma.  CHF.  COPD. EXAM: PORTABLE CHEST 1 VIEW COMPARISON:  08/29/2021 FINDINGS: Prior median sternotomy. Left atrial appendage occlusion device. Tricuspid annuloplasty. Mild right hemidiaphragm elevation. Midline trachea. Moderate cardiomegaly. No pleural effusion or pneumothorax. No congestive failure. Clearing of right lung base. IMPRESSION: Cardiomegaly, without congestive failure or acute disease. Electronically Signed   By: Abigail Miyamoto M.D.   On: 09/02/2021 14:24        Scheduled Meds:   amiodarone  200 mg Oral BID   apixaban  5 mg Oral BID   busPIRone  5 mg Oral BID   colchicine  0.3 mg Oral Daily   cycloSPORINE  1 drop Both Eyes BID   dexamethasone (DECADRON) injection  6 mg Intravenous Q24H   docusate sodium  100 mg Oral BID   DULoxetine  60 mg Oral Daily   fluticasone  2 spray Each Nare Daily   insulin aspart  0-5 Units Subcutaneous QHS   insulin aspart  0-9 Units Subcutaneous TID WC   loratadine  10 mg Oral Daily   mouth rinse  15 mL Mouth Rinse BID   metoprolol succinate  37.5 mg Oral Daily   molnupiravir EUA  4 capsule Oral BID   multivitamin with minerals  1 tablet Oral Daily   pantoprazole  40 mg Oral Daily   potassium chloride  40 mEq Oral Daily   rosuvastatin  10 mg Oral QPM   sodium chloride flush  3 mL Intravenous Q12H   Continuous Infusions:  sodium chloride 250 mL (08/27/21 2138)          Aline August, MD [facility] 09/03/2021, 10:46 AM

## 2021-09-03 NOTE — Consult Note (Signed)
Renal Service Consult Note [facility]  [name] 09/03/2021 Leslie Blazing, MD Requesting Physician: Dr. Starla Link  Reason for Consult: Renal failure HPI: The patient is a 66 y.o. year-old w/ hx of COPD, depression, DM2, DVT, atrial fib, HTN, h/o MV repair and TV replacement (early 2022), combined diast/ systolic CHF and CKD IV w/ baseline creat 2.6- 3.8.  Pt was admitted for CP and SOB and found to be COVID +. Also had mild leg swelling, and palpitations. In ED creat 2.6, HR 133, RR 20 , 99% on RA. Pt admitted w/ atrial fib/ RVR, SOB, acute/ chron combined CHF and was put on IV lasix for diuresis.  She got IV lasix for 3 days 80 bid, then po demadex for the next 4 days. Diuretics were dc'd on 1/12 due to rising creatinine up to 3.3 yest. Creat was up to 3.7 today. Asked to see for renal faliure.   Pt seen in room.   She saw renal MD Dr Joylene Grapes first team referred as outpatient in the fall of 2022. She doesn't know what stage CKD she has.  She denies any current SOB, cough or leg swelling. Voiding w/o difficulty. No confusion, jerking or n/v.   ROS - denies CP, no joint pain, no HA, no blurry vision, no rash, no diarrhea, no nausea/ vomiting, no dysuria, no voiding issues   Past Medical History  Past Medical History:  Diagnosis Date   Allergic rhinitis    Arthritis    Asthma    Chest pain 12/27/2017   Chronic diastolic CHF (congestive heart failure) (HCC)    COPD (chronic obstructive pulmonary disease) (HCC)    Depression    DM (diabetes mellitus) ([place])    DVT (deep vein thrombosis) in pregnancy    Dysrhythmia    atrial fibrilation   GERD (gastroesophageal reflux disease)    Headache(784.0)    HTN (hypertension)    Hyperlipidemia    Obesity    Pneumonia 04/2018   RIGHT LOBE   Shortness of breath    Sleep apnea    compliant with CPAP   Past Surgical History  Past Surgical History:  Procedure Laterality Date   ABDOMINAL HYSTERECTOMY     BUBBLE STUDY   11/26/2020   Procedure: BUBBLE STUDY;  Surgeon: Buford Dresser, MD;  Location: [place];  Service: Cardiovascular;;   CARDIOVERSION N/A 05/06/2020   Procedure: CARDIOVERSION;  Surgeon: Buford Dresser, MD;  Location: [facility] ENDOSCOPY;  Service: Cardiovascular;  Laterality: N/A;   CARDIOVERSION N/A 11/04/2020   Procedure: CARDIOVERSION;  Surgeon: Lelon Perla, MD;  Location: [place];  Service: Cardiovascular;  Laterality: N/A;   CARDIOVERSION N/A 02/01/2021   Procedure: CARDIOVERSION;  Surgeon: Jolaine Artist, MD;  Location: [place];  Service: Cardiovascular;  Laterality: N/A;   CARDIOVERSION N/A 02/09/2021   Procedure: CARDIOVERSION;  Surgeon: Jolaine Artist, MD;  Location: [facility] ENDOSCOPY;  Service: Cardiovascular;  Laterality: N/A;   CARDIOVERSION N/A 04/19/2021   Procedure: CARDIOVERSION;  Surgeon: Fay Records, MD;  Location: [place];  Service: Cardiovascular;  Laterality: N/A;   CATARACT EXTRACTION, BILATERAL     CHOLECYSTECTOMY     COLONOSCOPY WITH PROPOFOL N/A 03/05/2015   Procedure: COLONOSCOPY WITH PROPOFOL;  Surgeon: Carol Ada, MD;  Location: [facility] ENDOSCOPY;  Service: Endoscopy;  Laterality: N/A;   DIAGNOSTIC LAPAROSCOPY     ingrown hallux Left    KNEE SURGERY     LEFT HEART CATH AND CORONARY ANGIOGRAPHY N/A 12/31/2017   Procedure: LEFT HEART CATH  AND CORONARY ANGIOGRAPHY;  Surgeon: Charolette Forward, MD;  Location: [place] CV LAB;  Service: Cardiovascular;  Laterality: N/A;   MAZE N/A 12/29/2020   Procedure: MAZE;  Surgeon: [name], MD;  Location: [place];  Service: Open Heart Surgery;  Laterality: N/A;   MITRAL VALVE REPAIR N/A 12/29/2020   Procedure: MITRAL VALVE REPAIR USING CARBOMEDICS ANNULOFLEX RING SIZE 30;  Surgeon: [name], MD;  Location: [place];  Service: Open Heart Surgery;  Laterality: N/A;   RIGHT HEART CATH N/A 12/22/2020   Procedure: RIGHT HEART CATH;  Surgeon: Larey Dresser, MD;  Location: [place] CV  LAB;  Service: Cardiovascular;  Laterality: N/A;   RIGHT HEART CATH N/A 01/31/2021   Procedure: RIGHT HEART CATH;  Surgeon: Jolaine Artist, MD;  Location: [place] CV LAB;  Service: Cardiovascular;  Laterality: N/A;   RIGHT HEART CATH N/A 07/29/2021   Procedure: RIGHT HEART CATH;  Surgeon: Jolaine Artist, MD;  Location: [place] CV LAB;  Service: Cardiovascular;  Laterality: N/A;   TEE WITHOUT CARDIOVERSION N/A 11/26/2020   Procedure: TRANSESOPHAGEAL ECHOCARDIOGRAM (TEE);  Surgeon: Buford Dresser, MD;  Location[facility]COPY;  Service: Cardiovascular;  Laterality: N/A;   TEE WITHOUT CARDIOVERSION N/A 12/29/2020   Procedure: TRANSESOPHAGEAL ECHOCARDIOGRAM (TEE);  Surgeon: [name], MD;  Location: [place];  Service: Open Heart Surgery;  Laterality: N/A;   TRICUSPID VALVE REPLACEMENT N/A 12/29/2020   Procedure: 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 RING SIZE 34;  Surgeon: [name], MD;  Location: [place];  Service: Open Heart Surgery;  Laterality: N/A;   Family History  Family History  Problem Relation Age of Onset   Cancer Mother    Hypertension Mother    Cancer Father    CAD Other    Hypertension Sister    Hypertension Brother    Social History  reports that she has never smoked. She has never used smokeless tobacco. She reports that she does not drink alcohol and does not use drugs. Allergies  Allergies  Allergen Reactions   Bee Pollen Anaphylaxis   Sulfa Antibiotics Itching   Home medications Prior to Admission medications   Medication Sig Start Date End Date Taking? Authorizing Provider  acetaminophen (TYLENOL) 500 MG tablet Take 1,000 mg by mouth every 6 (six) hours as needed for mild pain.   Yes [provider]  albuterol (VENTOLIN HFA) 108 (90 Base) MCG/ACT inhaler Inhale 2 puffs into the lungs every 4 (four) hours as needed for wheezing or shortness of breath (Asthma). 08/15/19  Yes [provider]   ALPRAZolam Duanne Moron) 0.5 MG tablet Take 1 tablet (0.5 mg total) by mouth 2 (two) times daily as needed for anxiety. 02/15/21  Yes [name], NP  amiodarone (PACERONE) 200 MG tablet Take 1 tablet (200 mg total) by mouth daily for 7 days, THEN 1 tablet (200 mg total) daily. Patient taking differently: 200 mg daily 07/27/21 08/03/22 Yes Vickie Epley, MD  apixaban (ELIQUIS) 5 MG TABS tablet Take 1 tablet (5 mg total) by mouth 2 (two) times daily. 05/16/21  Yes [place], Maricela Bo, FNP  busPIRone (BUSPAR) 5 MG tablet TAKE 1 TABLET BY MOUTH TWICE A DAY Patient taking differently: Take 5 mg by mouth 2 (two) times daily. 08/22/21  Yes [name], NP  colchicine 0.6 MG tablet Take 0.3 mg by mouth daily.   Yes [provider]  dexamethasone (DECADRON) 6 MG tablet Take 1 tablet (6 mg total) by  mouth daily for 7 days. 09/02/21 09/09/21 Yes Aline August, MD  docusate sodium (COLACE) 100 MG capsule Take 100 mg by mouth 2 (two) times daily.   Yes [provider]  DULoxetine (CYMBALTA) 60 MG capsule Take 1 capsule (60 mg total) by mouth daily. 06/14/21  Yes [name], NP  EPINEPHrine 0.3 mg/0.3 mL IJ SOAJ injection Inject 0.3 mg into the muscle as needed for anaphylaxis. 06/25/20  Yes [name], NP  fluticasone (FLONASE) 50 MCG/ACT nasal spray Place 2 sprays into both nostrils daily. 05/24/21  Yes [name], NP  loratadine (CLARITIN) 10 MG tablet Take 1 tablet (10 mg total) by mouth daily. 05/24/21  Yes [name], NP  metoprolol succinate (TOPROL XL) 25 MG 24 hr tablet Take 1.5 tablets (37.5 mg total) by mouth daily. 04/26/21 04/26/22 Yes Sherran Needs, NP  Multiple Vitamins-Minerals (MULTIVITAMIN WOMEN PO) Take 1 tablet by mouth daily.   Yes [provider]  nitroGLYCERIN (NITROSTAT) 0.4 MG SL tablet Place 1 tablet (0.4 mg total) under the tongue every 5 (five) minutes x 3 doses as needed for chest pain. 01/08/14  Yes Charolette Forward, MD   RESTASIS 0.05 % ophthalmic emulsion Place 1 drop into both eyes 2 (two) times daily. 11/19/17  Yes [provider]  torsemide (DEMADEX) 100 MG tablet Take 100 mg by mouth daily. 07/11/21  Yes [provider]  torsemide (DEMADEX) 20 MG tablet Take 3 tablets (60 mg total) by mouth 2 (two) times daily. 09/04/21  Yes Aline August, MD  TRULICITY 1.5 GO/1.1XB SOPN INJECT 1.5 MG INTO THE SKIN ONCE A WEEK. 08/19/21 11/17/21 Yes [name], NP  Alcohol Swabs (B-D SINGLE USE SWABS REGULAR) PADS 1 each by Other route daily. 07/06/21   [name], NP  amiodarone (PACERONE) 200 MG tablet Take 1 tablet (200 mg total) by mouth 2 (two) times daily. 09/02/21   Aline August, MD  amoxicillin-clavulanate (AUGMENTIN) 875-125 MG tablet Take 1 tablet by mouth 2 (two) times daily. Patient not taking: Reported on 08/26/2021 08/25/21   Mayers, Cari S, PA-C  benzonatate (TESSALON) 200 MG capsule Take 1 capsule (200 mg total) by mouth 2 (two) times daily as needed for cough. 09/02/21   Aline August, MD  blood glucose meter kit and supplies KIT Dispense based on patient and insurance preference. Use up to four times daily as directed. 02/10/21   Patrecia Pour, MD  blood glucose meter kit and supplies Dispense based on patient and insurance preference. Use up to four times daily as directed. (FOR ICD-10 E10.9, E11.9). 07/06/21   [name], NP  Blood Glucose Monitoring Suppl (ACCU-CHEK GUIDE ME) w/Device KIT Use as instructed to check blood sugar three times daily. E11.69 04/21/20   Charlott Rakes, MD  glucose blood (ACCU-CHEK GUIDE) test strip Use to check blood sugar TID. E11.69 07/13/21   Charlott Rakes, MD  guaiFENesin-dextromethorphan (ROBITUSSIN DM) 100-10 MG/5ML syrup Take 10 mLs by mouth every 4 (four) hours as needed for cough. 09/02/21   Aline August, MD  insulin aspart (NOVOLOG) 100 UNIT/ML injection Inject 2-10 Units into the skin 3 (three) times daily before meals. Per sliding  scale 09/02/21   Aline August, MD  metolazone (ZAROXOLYN) 2.5 MG tablet Take 1 tablet (2.5 mg total) by mouth every Monday, Wednesday, and Friday. Take every Monday and Friday 09/05/21   Aline August, MD  molnupiravir EUA (LAGEVRIO) 200 mg CAPS capsule Take 4 capsules (800 mg total) by mouth  2 (two) times daily. 09/02/21   Aline August, MD  pantoprazole (PROTONIX) 40 MG tablet Take 1 tablet (40 mg total) by mouth daily. 09/02/21   Aline August, MD  potassium chloride SA (KLOR-CON M) 20 MEQ tablet Take 2 tablets (40 mEq total) by mouth daily. Take an additional 20 meq with every dose of METOLAZONE 09/02/21   Aline August, MD  rosuvastatin (CRESTOR) 10 MG tablet Take 1 tablet (10 mg total) by mouth every evening. 09/02/21   Aline August, MD     Vitals:   09/03/21 8416 09/03/21 0900 09/03/21 1200 09/03/21 1656  BP: (!) 124/105 119/90 130/89 113/80  Pulse: 85   83  Resp:  $Remo'20 17 20  'wmxMX$ Temp:    (!) 97.3 F (36.3 C)  TempSrc:    Oral  SpO2:    96%  Weight:      Height:       Exam Gen alert, no distress No rash, cyanosis or gangrene Sclera anicteric, throat clear  No jvd or bruits Chest clear bilat to bases, no rales/ wheezing RRR no MRG Abd soft ntnd no mass or ascites +bs GU defer MS no joint effusions or deformity Ext obese LE / UE's without any edema Neuro is alert, Ox 3 , nf         Home meds include - xanax , amiodarone, eliquis, buspar, colchicine, cymbalta, toprol xl, sl ntg, demadex, trulicity, insulin novolog, zaroxolyn, protonix, Klor-con, crestor, prn's/ vits/ supps      Date   Creat  eGFR    2011   1.28- 1.96    2021   1.35- 2.09    Jan- June 2022  1.50- 4.36    July - sept 2022 2.04- 3.45    Oct - dec 2022 2.64- 3.82 13- 20 ml/min, ckd IV     Aug 26, 2020  2.61  20    Sep 03, 2020 3.70  13               UA 1/07 - negative   CXR 1/09 - IMPRESSION:  Stable mild-to-moderate cardiomegaly without overt pulmonary edema. Minimal patchy peripheral right lung base  opacity, cannot exclude mild aspiration or pneumonia.    CXR 1/13 - IMPRESSION: Cardiomegaly, without congestive failure or acute disease    I/O 1/6- 1/14 = 2.5 L in and 16 L out = net negative 13.5 L    IV lasix 80 bid >>  1/06 - 1/09     Aldactone 25 qd >> 1/07- 1/09     Demadex 60 po bid >> 1/09 - 1/12 dc'd    Stable BP's since admission MAP 78 - 97 range, ave 128/ 80    Last admit dc weight was 125kg. Today is 121 kg.            Assessment/ Plan: AKI on CKD 4 - b/l creatinine 2.64- 3.82, eGFR 13- 20 ml/min from Oct - dec 2022. Creat here 2.61 on admission, up to 2.89 on 121/, 3.28 on 1/13 and 3.70 today in setting of diuretics from 1.06- 1.12, held due to rising creatinine. I/O net are negative 13 L since admission, although not sure all po intake was recorded. Pt is down 14kg to 121 kg today.  Last dc weight per cards notes in Dec 2022 was 125kg.  BP's are okay, on metoprolol xl only. Will get renal US. UA is negative. Rising creatinine suspect is due to over-diuresis. Diuretics already have been held. Recommend IV NS at 75  cc/hr w/ bolus of 500 cc. F/u creat in am. Will follow.  COVID infection - per pmd H/o combined systolic/ diast CHF SP  MV repair and TV replacement in may 2022      Rob Lunden Mcleish  MD 09/03/2021, 6:21 PM  Recent Labs  Lab 08/31/21 0114 09/03/21 0104  WBC 3.4* 8.2  HGB 11.4* 12.9   Recent Labs  Lab 09/02/21 0129 09/03/21 0104  K 3.5 3.7  BUN 70* 93*  CREATININE 3.28* 3.70*  CALCIUM 9.0 9.2

## 2021-09-03 NOTE — Plan of Care (Signed)
°  Problem: Education: Goal: Knowledge of disease or condition will improve Outcome: Progressing Goal: Understanding of medication regimen will improve Outcome: Progressing Goal: Individualized Educational Video(s) Outcome: Progressing   Problem: Activity: Goal: Ability to tolerate increased activity will improve Outcome: Progressing   Problem: Cardiac: Goal: Ability to achieve and maintain adequate cardiopulmonary perfusion will improve Outcome: Progressing   Problem: Health Behavior/Discharge Planning: Goal: Ability to safely manage health-related needs after discharge will improve Outcome: Progressing   Problem: Education: Goal: Knowledge of General Education information will improve Description: Including pain rating scale, medication(s)/side effects and non-pharmacologic comfort measures Outcome: Progressing   Problem: Health Behavior/Discharge Planning: Goal: Ability to manage health-related needs will improve Outcome: Progressing   Problem: Clinical Measurements: Goal: Will remain free from infection Outcome: Progressing Goal: Diagnostic test results will improve Outcome: Progressing Goal: Respiratory complications will improve Outcome: Progressing Goal: Cardiovascular complication will be avoided Outcome: Progressing   Problem: Activity: Goal: Risk for activity intolerance will decrease Outcome: Progressing   Problem: Nutrition: Goal: Adequate nutrition will be maintained Outcome: Progressing   Problem: Coping: Goal: Level of anxiety will decrease Outcome: Progressing   Problem: Elimination: Goal: Will not experience complications related to bowel motility Outcome: Progressing Goal: Will not experience complications related to urinary retention Outcome: Progressing   Problem: Pain Managment: Goal: General experience of comfort will improve Outcome: Progressing   Problem: Safety: Goal: Ability to remain free from injury will improve Outcome:  Progressing   Problem: Skin Integrity: Goal: Risk for impaired skin integrity will decrease Outcome: Progressing

## 2021-09-03 NOTE — Progress Notes (Signed)
Mobility Specialist Progress Note    09/03/21 1757  Mobility  Activity Ambulated in hall  Level of Assistance Standby assist, set-up cues, supervision of patient - no hands on  Assistive Device Four wheel walker  Distance Ambulated (ft) 110 ft  Mobility Ambulated with assistance in hallway  Mobility Response Tolerated fair  Mobility performed by Mobility specialist  $Mobility charge 1 Mobility   Pre-Mobility: 87 HR, 124/75 BP, 97% SpO2 During Mobility: 111 HR, 98% SpO2 Post-Mobility: 93 HR, 96% SpO2  Pt received in bed and agreeable. Took x1 seated rest break. Returned to bed with call bell in reach and visitor present.   [facility] Mobility Specialist  M.S. 2C and 6E: [phone] M.S. 4E: [phone]

## 2021-09-04 LAB — BASIC METABOLIC PANEL
Anion gap: 8 (ref 5–15)
BUN: 83 mg/dL — ABNORMAL HIGH (ref 8–23)
CO2: 26 mmol/L (ref 22–32)
Calcium: 9.2 mg/dL (ref 8.9–10.3)
Chloride: 97 mmol/L — ABNORMAL LOW (ref 98–111)
Creatinine, Ser: 2.91 mg/dL — ABNORMAL HIGH (ref 0.44–1.00)
GFR, Estimated: 17 mL/min — ABNORMAL LOW (ref >60)
Glucose, Bld: 326 mg/dL — ABNORMAL HIGH (ref 70–99)
Potassium: 3.4 mmol/L — ABNORMAL LOW (ref 3.5–5.1)
Sodium: 131 mmol/L — ABNORMAL LOW (ref 135–145)

## 2021-09-04 LAB — C-REACTIVE PROTEIN: CRP: 0.6 mg/dL (ref <1.0)

## 2021-09-04 LAB — GLUCOSE, CAPILLARY
Glucose-Capillary: 306 mg/dL — ABNORMAL HIGH (ref 70–99)
Glucose-Capillary: 181 mg/dL — ABNORMAL HIGH (ref 70–99)

## 2021-09-04 LAB — MAGNESIUM: Magnesium: 2.2 mg/dL (ref 1.7–2.4)

## 2021-09-04 MED ORDER — INSULIN ASPART 100 UNIT/ML IJ SOLN
5.0000 [IU] | Freq: Three times a day (TID) | INTRAMUSCULAR | Status: DC
  Administered 2021-09-04: 5 [IU] via SUBCUTANEOUS

## 2021-09-04 MED ORDER — DEXAMETHASONE 6 MG PO TABS: 6.0000 mg | ORAL_TABLET | Freq: Every day | ORAL | 0 refills | Status: AC

## 2021-09-04 MED ORDER — INSULIN DETEMIR 100 UNIT/ML ~~LOC~~ SOLN
10.0000 [IU] | Freq: Every day | SUBCUTANEOUS | Status: DC
  Administered 2021-09-04: 10 [IU] via SUBCUTANEOUS
  Filled 2021-09-04: qty 0.1

## 2021-09-04 MED ORDER — TORSEMIDE 20 MG PO TABS: 40.0000 mg | ORAL_TABLET | Freq: Two times a day (BID) | ORAL | 0 refills | Status: DC

## 2021-09-04 MED ORDER — METOLAZONE 2.5 MG PO TABS: 2.5000 mg | ORAL_TABLET | ORAL | 0 refills | Status: DC

## 2021-09-04 NOTE — Progress Notes (Signed)
[facility] Progress Note  Subjective: Patient not seen directly today given COVID-19 + status, utilizing data taken from chart +/- discussions w/ providers and staff.   Creat down to 2.9 today. Good UOP.  Vitals:   09/04/21 0053 09/04/21 0330 09/04/21 0647 09/04/21 0735  BP: 135/89 123/64  123/64  Pulse: (!) 52 76  71  Resp: $Remo'18 19  16  'heTYP$ Temp: 98.6 F (37 C) 97.6 F (36.4 C)  97.8 F (36.6 C)  TempSrc: Oral Oral  Oral  SpO2: 91% 96%  97%  Weight:   123.1 kg   Height:        Exam: Patient not seen directly today given COVID-19 + status, utilizing data taken from chart +/- discussions w/ providers and staff.          Home meds include - xanax , amiodarone, eliquis, buspar, colchicine, cymbalta, toprol xl, sl ntg, demadex, trulicity, insulin novolog, zaroxolyn, protonix, Klor-con, crestor, prn's/ vits/ supps       Date                         Creat               eGFR    2011                         1.28- 1.96    2021                         1.35- 2.09    Jan- June 2022        1.50- 4.36    July - sept 2022       2.04- 3.45    Oct - dec 2022         2.64- 3.82        13- 20 ml/min, ckd IV      Aug 26, 2020              2.61                 20    Sep 03, 2020            3.70                 13                                                        UA 1/07 - negative   CXR 1/09 - IMPRESSION:  Stable mild-to-moderate cardiomegaly without overt pulmonary edema. Minimal patchy peripheral right lung base opacity, cannot exclude mild aspiration or pneumonia.    CXR 1/13 - IMPRESSION: Cardiomegaly, without congestive failure     I/O 1/6- 1/14 = 2.5 L in and 16 L out = net negative 13.5 L    IV lasix 80 bid >>  1/06 - 1/09     Aldactone 25 qd >> 1/07- 1/09     Demadex 60 po bid >> 1/09 - 1/12 dc'd    Stable BP's since admission MAP 78 - 97 range, ave 128/ 80       Assessment/ Plan: AKI on CKD 4 - b/l creatinine 2.64- 3.82, eGFR 13- 20 ml/min from Oct - dec 2022.  Creat here 2.61 on admission, up to 2.89 on 121/, 3.28 on 1/13 and 3.70 today in setting of IV then po diuresis from 1/06- 1/12 for weight gain and LE edema/ hx severe CHF. Diuretics held then due to rising creatinine. BP's okay, renal US no obstruction and UA negative. AKI due to over-diuresis. Creat improved today after small NS bolus and IVF"s overnight. OK for dc. Would hold diuretics at home about 3 days then resume. Will sign off.  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   Inpatient medications:  amiodarone  200 mg Oral BID   apixaban  5 mg Oral BID   busPIRone  5 mg Oral BID   colchicine  0.3 mg Oral Daily   cycloSPORINE  1 drop Both Eyes BID   dexamethasone (DECADRON) injection  6 mg Intravenous Q24H   docusate sodium  100 mg Oral BID   DULoxetine  60 mg Oral Daily   fluticasone  2 spray Each Nare Daily   insulin aspart  0-5 Units Subcutaneous QHS   insulin aspart  0-9 Units Subcutaneous TID WC   insulin aspart  5 Units Subcutaneous TID WC   insulin detemir  10 Units Subcutaneous Daily   loratadine  10 mg Oral Daily   mouth rinse  15 mL Mouth Rinse BID   metoprolol succinate  37.5 mg Oral Daily   multivitamin with minerals  1 tablet Oral Daily   pantoprazole  40 mg Oral Daily   potassium chloride  40 mEq Oral Daily   rosuvastatin  10 mg Oral QPM   sodium chloride flush  3 mL Intravenous Q12H    sodium chloride 250 mL (08/27/21 2138)   sodium chloride 75 mL/hr at 09/04/21 0333   sodium chloride, acetaminophen **OR** acetaminophen, albuterol, ALPRAZolam, guaiFENesin-dextromethorphan, nitroGLYCERIN,  sodium chloride flush

## 2021-09-04 NOTE — Progress Notes (Signed)
RN went over d/c summary with pt. NT removed PIVs. Belongings with pt. Pt said "Marchia Bond" is picking her up. NT transporting pt to private vehicle.

## 2021-09-04 NOTE — Discharge Summary (Signed)
Physician Discharge Summary  [name] NTZ:[id number] DOB: 1956-06-17 DOA: 08/26/2021  PCP: [name], NP  Admit date: 08/26/2021 Discharge date: 09/04/2021  Admitted From: Home Disposition: Home  Recommendations for Outpatient Follow-up:  Follow up with PCP in 1 week with repeat CBC/BMP Outpatient follow-up with cardiology and nephrology Follow up in ED if symptoms worsen or new appear   Home Health: No Equipment/Devices: None  Discharge Condition: Stable CODE STATUS: Full Diet recommendation: Heart healthy/carb modified/fluid restriction of up to 1200 cc a day  Brief/Interim Summary: 66 y.o. female with medical history significant of PAF on  eliquis with hx of multiple cardioversions, severe MR/TR,  Q7RF, chronic diastolic/systolic CHF, CKD stage IV, severe MR s/p mitral valve replacement in 12/2020, OSA, COPD who presented to ED with chest pain and shortness of breath.  On presentation, she was found to be in A. fib with RVR along with acute on chronic systolic CHF and admitted to the hospital on IV Lasix.  Cardiology was consulted for her work-up further showed she also had incidental COVID-19 infection.  She was subsequently treated with IV Decadron and oral molnupiravir from 08/30/2021.  Overall condition has improved.  Cardiology has signed off.  Patient is currently on room air.  She was supposed to be discharged home on 09/02/2021 but patient most likely had a vasovagal event while having a bowel movement and subsequently did not feel comfortable going home.  Discharge was held on 09/02/2021.  Nephrology was consulted on 09/03/2021 for acute kidney injury and patient was started on gentle hydration.  Kidney function is improving today and nephrology has cleared the patient for discharge home today with outpatient follow-up with nephrology.  Diuretic plan as below.  Discharge home today.  Discharge Diagnoses:   Acute hypoxic respiratory failure Acute on chronic systolic  heart failure -Respiratory status improving.  Currently on room air. -Initially treated with IV Lasix.  Currently already on oral torsemide -EF improved to 45% from 20% in the past. -Cardiology signed off on 08/31/2021: Continue metoprolol succinate, Outpatient follow-up with cardiology.   -Continue fluid and diet restriction.  Negative balance of 13,308.4 cc since admission. --Torsemide and metolazone held on 09/02/2021 because of worsening renal function.  She had to be treated with IV fluids on 09/03/2022 AKI.  Diuretic plan as below.   COVID-19 infection -Currently on room air. COVID-19 Labs  Recent Labs    09/02/21 0129 09/04/21 0058  CRP 0.8 0.6     Lab Results  Component Value Date   SARSCOV2NAA POSITIVE (A) 08/26/2021   [place] NEGATIVE 07/28/2021   [place] NEGATIVE 05/02/2021   [place] NEGATIVE 02/25/2021      -Started on IV Decadron and oral molnupiravir on 08/30/2021.  Discharge home today on oral Decadron to finish total of 10-day course of therapy.  Completed 5-day course of oral molnupiravir therapy. -Complete 10-day course of isolation upon discharge   Paroxysmal A. fib with RVR -Currently rate controlled.  Continue oral amiodarone and Eliquis.  Outpatient follow-up with cardiology   Severe MR status post repair/severe TR -Outpatient follow-up with cardiology.   COPD -Currently stable.  Hyperlipidemia - continue statin  Hypokalemia -Improved.   GERD -Continue PPI   Acute kidney injury on CKD stage IV -Baseline creatinine around 2.6-3.  Creatinine 3.70 on 09/03/2021.nephrology was consulted on 09/03/2021 and patient was treated with gentle hydration.  Creatinine 2.91 today.  Nephrology has cleared the patient for discharge with close outpatient follow-up with nephrology. -After discussion with nephrology: Will resume diuretics  from 09/07/2021 onwards but at a lower dose: Torsemide 40 mg twice a day and metolazone 2.5 mg 3 times a week.  Patient  needs close outpatient follow-up of creatinine as an outpatient and does adjusted accordingly by PCP and/or cardiology. Diabetes mellitus type 2 -Carb modified diet.  Continue outpatient regimen  Morbid obesity  -Outpatient follow-up  Discharge Instructions  Discharge Instructions     Ambulatory referral to Cardiology   Complete by: As directed    Diet - low sodium heart healthy   Complete by: As directed    Diet Carb Modified   Complete by: As directed    Increase activity slowly   Complete by: As directed       Allergies as of 09/04/2021       Reactions   Bee Pollen Anaphylaxis   Sulfa Antibiotics Itching        Medication List     STOP taking these medications    ALPRAZolam 0.5 MG tablet Commonly known as: XANAX   amoxicillin-clavulanate 875-125 MG tablet Commonly known as: AUGMENTIN       TAKE these medications    Accu-Chek Guide Me w/Device Kit Use as instructed to check blood sugar three times daily. E11.69   Accu-Chek Guide test strip Generic drug: glucose blood Use to check blood sugar TID. E11.69   acetaminophen 500 MG tablet Commonly known as: TYLENOL Take 1,000 mg by mouth every 6 (six) hours as needed for mild pain.   albuterol 108 (90 Base) MCG/ACT inhaler Commonly known as: VENTOLIN HFA Inhale 2 puffs into the lungs every 4 (four) hours as needed for wheezing or shortness of breath (Asthma).   amiodarone 200 MG tablet Commonly known as: PACERONE Take 1 tablet (200 mg total) by mouth 2 (two) times daily. What changed: See the new instructions.   apixaban 5 MG Tabs tablet Commonly known as: Eliquis Take 1 tablet (5 mg total) by mouth 2 (two) times daily.   B-D SINGLE USE SWABS REGULAR Pads 1 each by Other route daily.   benzonatate 200 MG capsule Commonly known as: TESSALON Take 1 capsule (200 mg total) by mouth 2 (two) times daily as needed for cough.   blood glucose meter kit and supplies Dispense based on patient and  insurance preference. Use up to four times daily as directed. (FOR ICD-10 E10.9, E11.9).   blood glucose meter kit and supplies Kit Dispense based on patient and insurance preference. Use up to four times daily as directed.   busPIRone 5 MG tablet Commonly known as: BUSPAR TAKE 1 TABLET BY MOUTH TWICE A DAY   colchicine 0.6 MG tablet Take 0.3 mg by mouth daily.   dexamethasone 6 MG tablet Commonly known as: DECADRON Take 1 tablet (6 mg total) by mouth daily for 4 days. Start taking on: September 05, 2021   docusate sodium 100 MG capsule Commonly known as: COLACE Take 100 mg by mouth 2 (two) times daily.   DULoxetine 60 MG capsule Commonly known as: CYMBALTA Take 1 capsule (60 mg total) by mouth daily.   EPINEPHrine 0.3 mg/0.3 mL Soaj injection Commonly known as: EPI-PEN Inject 0.3 mg into the muscle as needed for anaphylaxis.   fluticasone 50 MCG/ACT nasal spray Commonly known as: FLONASE Place 2 sprays into both nostrils daily.   guaiFENesin-dextromethorphan 100-10 MG/5ML syrup Commonly known as: ROBITUSSIN DM Take 10 mLs by mouth every 4 (four) hours as needed for cough.   loratadine 10 MG tablet Commonly known as: CLARITIN Take 1  tablet (10 mg total) by mouth daily.   metolazone 2.5 MG tablet Commonly known as: ZAROXOLYN Take 1 tablet (2.5 mg total) by mouth every Monday, Wednesday, and Friday. Start taking on: September 07, 2021 What changed:  when to take this additional instructions These instructions start on September 07, 2021. If you are unsure what to do until then, ask your doctor or other care provider.   metoprolol succinate 25 MG 24 hr tablet Commonly known as: Toprol XL Take 1.5 tablets (37.5 mg total) by mouth daily.   MULTIVITAMIN WOMEN PO Take 1 tablet by mouth daily.   nitroGLYCERIN 0.4 MG SL tablet Commonly known as: NITROSTAT Place 1 tablet (0.4 mg total) under the tongue every 5 (five) minutes x 3 doses as needed for chest pain.   NovoLOG  100 UNIT/ML injection Generic drug: insulin aspart Inject 2-10 Units into the skin 3 (three) times daily before meals. Per sliding scale   pantoprazole 40 MG tablet Commonly known as: PROTONIX Take 1 tablet (40 mg total) by mouth daily.   potassium chloride SA 20 MEQ tablet Commonly known as: KLOR-CON M Take 2 tablets (40 mEq total) by mouth daily. Take an additional 20 meq with every dose of METOLAZONE   Restasis 0.05 % ophthalmic emulsion Generic drug: cycloSPORINE Place 1 drop into both eyes 2 (two) times daily.   rosuvastatin 10 MG tablet Commonly known as: CRESTOR Take 1 tablet (10 mg total) by mouth every evening.   torsemide 20 MG tablet Commonly known as: DEMADEX Take 2 tablets (40 mg total) by mouth 2 (two) times daily. Start taking on: September 07, 2021 What changed:  medication strength how much to take when to take this These instructions start on September 07, 2021. If you are unsure what to do until then, ask your doctor or other care provider.   Trulicity 1.5 DI/9.7OE Sopn Generic drug: Dulaglutide INJECT 1.5 MG INTO THE SKIN ONCE A WEEK.               Durable Medical Equipment  (From admission, onward)           Start     Ordered   09/01/21 1359  For home use only DME Walker rolling  Once       Question Answer Comment  Walker: With [place] Wheels   Patient needs a walker to treat with the following condition Weakness      09/01/21 1358            Follow-up Information     [name], NP. Schedule an appointment as soon as possible for a visit in 1 week(s).   Specialty: Internal Medicine Why: With repeat CBC/BMP Contact information: [address] [address] [phone]         Buford Dresser, MD .   Specialty: Cardiology Contact information: [address] [place] [address] [phone]         Vickie Epley, MD .   Specialties: Cardiology, Radiology Contact  information: [place] [address] [phone]         [place], [place]. Schedule an appointment as soon as possible for a visit in 1 week(s).   Contact information: [place] [address] [phone]                Allergies  Allergen Reactions   Bee Pollen Anaphylaxis   Sulfa Antibiotics Itching    Consultations: Cardiology/nephrology   Procedures/Studies:  DG Chest 2 View  Result Date: 08/26/2021 CLINICAL DATA:  cp EXAM: CHEST - 2 VIEW COMPARISON:  December 2022 FINDINGS: Cardiomegaly. Left atrial appendage clip present. Median sternotomy wires. No pleural effusion. No pneumothorax. No overt edema or lobar consolidation. IMPRESSION: Cardiomegaly.  No overt edema or effusion. Electronically Signed   By: Albin Felling M.D.   On: 08/26/2021 13:15   US RENAL  Result Date: 09/03/2021 CLINICAL DATA:  ARF EXAM: RENAL / URINARY TRACT ULTRASOUND COMPLETE COMPARISON:  None. FINDINGS: Right Kidney: Renal measurements: 9.7 x 4.9 x 4.9 cm = volume: 122 mL. Echogenicity within normal limits. There is a 1.1 x 0.7 x 0.9 cm simple renal cyst within the right kidney. No mass or hydronephrosis visualized. Left Kidney: Renal measurements: 9.5 x 5.5 x 5.5 cm = volume: 152 mL. Echogenicity within normal limits. Couple of calcified stones measuring up to 11 mm. No mass or hydronephrosis visualized. Urine bladder: Appears normal for degree of bladder distention. Prevoid volume of 74 mL. Other: None. IMPRESSION: Nonobstructive left nephrolithiasis measuring up to 11 mm. Electronically Signed   By: Iven Finn M.D.   On: 09/03/2021 21:51   DG CHEST PORT 1 VIEW  Result Date: 09/02/2021 CLINICAL DATA:  Dyspnea.  Asthma.  CHF.  COPD. EXAM: PORTABLE CHEST 1 VIEW COMPARISON:  08/29/2021 FINDINGS: Prior median sternotomy. Left atrial appendage occlusion device. Tricuspid annuloplasty. Mild right hemidiaphragm elevation. Midline trachea. Moderate cardiomegaly. No  pleural effusion or pneumothorax. No congestive failure. Clearing of right lung base. IMPRESSION: Cardiomegaly, without congestive failure or acute disease. Electronically Signed   By: Abigail Miyamoto M.D.   On: 09/02/2021 14:24   DG Chest Port 1 View  Result Date: 08/29/2021 CLINICAL DATA:  Dyspnea, COPD, CHF EXAM: PORTABLE CHEST 1 VIEW COMPARISON:  08/26/2021 chest radiograph. FINDINGS: Intact sternotomy wires. Tricuspid valve annuloplasty ring in place. Stable cardiomediastinal silhouette with mild to moderate cardiomegaly. No pneumothorax. No pleural effusion. No overt pulmonary edema. Minimal patchy peripheral right lung base opacity. IMPRESSION: 1. Stable mild-to-moderate cardiomegaly without overt pulmonary edema. 2. Minimal patchy peripheral right lung base opacity, cannot exclude mild aspiration or pneumonia. Chest radiograph follow-up advised. Electronically Signed   By: Ilona Sorrel M.D.   On: 08/29/2021 10:56      Subjective: Patient seen and examined at bedside.  Feels better and thinks that she is ready to go home today.  Denies any overnight fever, nausea or vomiting.  Still has intermittent cough. Discharge Exam: Vitals:   09/04/21 0330 09/04/21 0735  BP: 123/64 123/64  Pulse: 76 71  Resp: 19 16  Temp: 97.6 F (36.4 C) 97.8 F (36.6 C)  SpO2: 96% 97%    General: On room air currently.  No distress.   Cardiovascular: S1-S2 positive; currently rate controlled.   Respiratory: Decreased breath sounds at bases bilaterally with scattered crackles Abdominal: Soft, morbidly obese, NT, ND, bowel sounds + Extremities: No clubbing; mild lower extremity edema present    The results of significant diagnostics from this hospitalization (including imaging, microbiology, ancillary and laboratory) are listed below for reference.     Microbiology: Recent Results (from the past 240 hour(s))  Resp Panel by RT-PCR (Flu A&B, Covid) Nasopharyngeal Swab     Status: Abnormal   Collection Time:  08/26/21  3:34 PM   Specimen: Nasopharyngeal Swab; Nasopharyngeal(NP) swabs in vial transport medium  Result Value Ref Range Status   SARS Coronavirus 2 by RT PCR POSITIVE (A) NEGATIVE Final    Comment: (NOTE) SARS-CoV-2 target nucleic  acids are DETECTED.  The SARS-CoV-2 RNA is generally detectable in upper respiratory specimens during the acute phase of infection. Positive results are indicative of the presence of the identified virus, but do not rule out bacterial infection or co-infection with other pathogens not detected by the test. Clinical correlation with patient history and other diagnostic information is necessary to determine patient infection status. The expected result is Negative.  Fact Sheet for Patients: EntrepreneurPulse.com.au  Fact Sheet for Healthcare Providers: IncredibleEmployment.be  This test is not yet approved or cleared by the [place] FDA and  has been authorized for detection and/or diagnosis of SARS-CoV-2 by FDA under an Emergency Use Authorization (EUA).  This EUA will remain in effect (meaning this test can be used) for the duration of  the COVID-19 declaration under Section 564(b)(1) of the A ct, 21 U.S.C. section 360bbb-3(b)(1), unless the authorization is terminated or revoked sooner.     Influenza A by PCR NEGATIVE NEGATIVE Final   Influenza B by PCR NEGATIVE NEGATIVE Final    Comment: (NOTE) The Xpert Xpress SARS-CoV-2/FLU/RSV plus assay is intended as an aid in the diagnosis of influenza from Nasopharyngeal swab specimens and should not be used as a sole basis for treatment. Nasal washings and aspirates are unacceptable for Xpert Xpress SARS-CoV-2/FLU/RSV testing.  Fact Sheet for Patients: EntrepreneurPulse.com.au  Fact Sheet for Healthcare Providers: IncredibleEmployment.be  This test is not yet approved or cleared by the [place] FDA and has been  authorized for detection and/or diagnosis of SARS-CoV-2 by FDA under an Emergency Use Authorization (EUA). This EUA will remain in effect (meaning this test can be used) for the duration of the COVID-19 declaration under Section 564(b)(1) of the Act, 21 U.S.C. section 360bbb-3(b)(1), unless the authorization is terminated or revoked.  Performed at [facility], [place] [address], [place], [place] [address]      Labs: BNP (last 3 results) Recent Labs    08/29/21 0642 08/30/21 0531 08/31/21 0114  BNP 90.3 88.1 132.7*    Basic Metabolic Panel: Recent Labs  Lab 08/30/21 0531 08/31/21 0114 09/02/21 0129 09/03/21 0104 09/04/21 0058  NA 137 134* 131* 130* 131*  K 3.5 4.1 3.5 3.7 3.4*  CL 98 96* 91* 90* 97*  CO2 $Re'29 27 29 27 26  'bzM$ GLUCOSE 99 167* 281* 374* 326*  BUN 35* 43* 70* 93* 83*  CREATININE 2.66* 2.89* 3.28* 3.70* 2.91*  CALCIUM 8.5* 9.1 9.0 9.2 9.2  MG 2.1 2.1 2.2 2.3 2.2    Liver Function Tests: Recent Labs  Lab 08/29/21 0642 08/30/21 0531 08/31/21 0114  AST $Re'24 23 24  'OoB$ ALT $R'27 24 23  'TE$ ALKPHOS 149* 156* 161*  BILITOT 0.8 0.6 0.7  PROT 7.0 7.2 7.9  ALBUMIN 2.6* 2.7* 2.8*    No results for input(s): LIPASE, AMYLASE in the last 168 hours.  No results for input(s): AMMONIA in the last 168 hours. CBC: Recent Labs  Lab 08/29/21 0642 08/30/21 0531 08/31/21 0114 09/03/21 0104  WBC 4.6 4.1 3.4* 8.2  NEUTROABS 2.2 1.4* 2.0 6.2  HGB 10.7* 10.8* 11.4* 12.9  HCT 32.8* 33.2* 34.2* 38.6  MCV 88.6 87.4 84.9 83.7  PLT 142* 150 193 305    Cardiac Enzymes: No results for input(s): CKTOTAL, CKMB, CKMBINDEX, TROPONINI in the last 168 hours. BNP: Invalid input(s): POCBNP CBG: Recent Labs  Lab 09/03/21 0758 09/03/21 1141 09/03/21 1653 09/03/21 2210 09/04/21 0646  GLUCAP 245* 292* 410* 363* 306*    D-Dimer No results for input(s): DDIMER  in the last 72 hours. Hgb A1c No results for input(s): HGBA1C in the last 72 hours. Lipid Profile No results for  input(s): CHOL, HDL, LDLCALC, TRIG, CHOLHDL, LDLDIRECT in the last 72 hours. Thyroid function studies No results for input(s): TSH, T4TOTAL, T3FREE, THYROIDAB in the last 72 hours.  Invalid input(s): FREET3 Anemia work up No results for input(s): VITAMINB12, FOLATE, FERRITIN, TIBC, IRON, RETICCTPCT in the last 72 hours. Urinalysis    Component Value Date/Time   COLORURINE YELLOW 08/27/2021 0026   APPEARANCEUR CLEAR 08/27/2021 0026   LABSPEC 1.015 08/27/2021 0026   PHURINE 6.0 08/27/2021 0026   GLUCOSEU NEGATIVE 08/27/2021 0026   HGBUR SMALL (A) 08/27/2021 0026   BILIRUBINUR NEGATIVE 08/27/2021 0026   BILIRUBINUR negative 09/24/2019 1742   KETONESUR NEGATIVE 08/27/2021 0026   PROTEINUR NEGATIVE 08/27/2021 0026   UROBILINOGEN 1.0 09/24/2019 1742   UROBILINOGEN 1.0 10/26/2014 0249   NITRITE NEGATIVE 08/27/2021 0026   LEUKOCYTESUR NEGATIVE 08/27/2021 0026   Sepsis Labs Invalid input(s): PROCALCITONIN,  WBC,  LACTICIDVEN Microbiology Recent Results (from the past 240 hour(s))  Resp Panel by RT-PCR (Flu A&B, Covid) Nasopharyngeal Swab     Status: Abnormal   Collection Time: 08/26/21  3:34 PM   Specimen: Nasopharyngeal Swab; Nasopharyngeal(NP) swabs in vial transport medium  Result Value Ref Range Status   SARS Coronavirus 2 by RT PCR POSITIVE (A) NEGATIVE Final    Comment: (NOTE) SARS-CoV-2 target nucleic acids are DETECTED.  The SARS-CoV-2 RNA is generally detectable in upper respiratory specimens during the acute phase of infection. Positive results are indicative of the presence of the identified virus, but do not rule out bacterial infection or co-infection with other pathogens not detected by the test. Clinical correlation with patient history and other diagnostic information is necessary to determine patient infection status. The expected result is Negative.  Fact Sheet for Patients: EntrepreneurPulse.com.au  Fact Sheet for Healthcare  Providers: IncredibleEmployment.be  This test is not yet approved or cleared by the [place] FDA and  has been authorized for detection and/or diagnosis of SARS-CoV-2 by FDA under an Emergency Use Authorization (EUA).  This EUA will remain in effect (meaning this test can be used) for the duration of  the COVID-19 declaration under Section 564(b)(1) of the A ct, 21 U.S.C. section 360bbb-3(b)(1), unless the authorization is terminated or revoked sooner.     Influenza A by PCR NEGATIVE NEGATIVE Final   Influenza B by PCR NEGATIVE NEGATIVE Final    Comment: (NOTE) The Xpert Xpress SARS-CoV-2/FLU/RSV plus assay is intended as an aid in the diagnosis of influenza from Nasopharyngeal swab specimens and should not be used as a sole basis for treatment. Nasal washings and aspirates are unacceptable for Xpert Xpress SARS-CoV-2/FLU/RSV testing.  Fact Sheet for Patients: EntrepreneurPulse.com.au  Fact Sheet for Healthcare Providers: IncredibleEmployment.be  This test is not yet approved or cleared by the [place] FDA and has been authorized for detection and/or diagnosis of SARS-CoV-2 by FDA under an Emergency Use Authorization (EUA). This EUA will remain in effect (meaning this test can be used) for the duration of the COVID-19 declaration under Section 564(b)(1) of the Act, 21 U.S.C. section 360bbb-3(b)(1), unless the authorization is terminated or revoked.  Performed at [facility], [place] [address], [place], [place] [address]      Time coordinating discharge: 35 minutes  SIGNED:   Aline August, MD  [facility] 09/04/2021, 10:39 AM

## 2021-09-04 NOTE — TOC Transition Note (Signed)
Transition of Care [facility]) - CM/SW Discharge Note   Patient Details  Name: [name] MRN: [id number] Date of Birth: 01-02-1956  [facility] CM/SW Contact:  Bartholomew Crews, RN Phone Number: [phone] 09/04/2021, 3:21 PM   Clinical Narrative:     Patient to transition home today. No TOC needs.   Final next level of care: Home/Self Care Barriers to Discharge: No Barriers Identified   Patient Goals and CMS Choice Patient states their goals for this hospitalization and ongoing recovery are:: To feel better and be able to go home CMS Medicare.gov Compare Post Acute Care list provided to::  (n/a)    Discharge Placement                       Discharge Plan and Services In-house Referral: NA Discharge Planning Services: CM Consult Post Acute Care Choice: NA          DME Arranged:  (unable to order due to patient has had walekr in 2021) DME Agency: [employer] Date DME Agency Contacted: 09/01/21 Time DME Agency Contacted: [id number] Representative spoke with at DME Agency: [place]: NA (no recommendation yet- pending) [place] Agency: NA        Social Determinants of Health ([place]) Interventions     Readmission Risk Interventions Readmission Risk Prevention Plan 08/31/2021 02/10/2021 01/11/2021  Transportation Screening Complete Complete Complete  Medication Review Press photographer) Referral to Pharmacy Complete -  PCP or Specialist appointment within 3-5 days of discharge Complete Complete Complete  HRI or Home Care Consult Complete Complete Complete  SW Recovery Care/Counseling Consult Complete Complete Complete  Palliative Care Screening Not Applicable Not Applicable Not [place] Not Applicable Not Applicable Not Applicable  Some recent data might be hidden

## 2021-09-04 NOTE — Plan of Care (Signed)
°  Problem: Activity: Goal: Ability to tolerate increased activity will improve Outcome: Progressing   Problem: Clinical Measurements: Goal: Will remain free from infection Outcome: Progressing Goal: Respiratory complications will improve Outcome: Progressing   Problem: Activity: Goal: Risk for activity intolerance will decrease Outcome: Progressing   Problem: Nutrition: Goal: Adequate nutrition will be maintained Outcome: Progressing   Problem: Clinical Measurements: Goal: Diagnostic test results will improve Outcome: Not Progressing

## 2021-09-05 ENCOUNTER — Telehealth: Payer: Self-pay

## 2021-09-05 NOTE — Telephone Encounter (Signed)
Transition Care Management Follow-up Telephone Call Date of discharge and from where: 09/04/2021, [facility] How have you been since you were released from the hospital? She said she is feeling all right, a lot better. She also said that [name] can call her if she wants to.  Any questions or concerns? No  Items Reviewed: Did the pt receive and understand the discharge instructions provided? Yes  Medications obtained and verified? Yes  -she said she has all of her medications and did not have any questions about her med regime  Other? No  Any new allergies since your discharge? No  Dietary orders reviewed? Yes Do you have support at home? Yes   Home Care and Equipment/Supplies: Were home health services ordered? no If so, what is the name of the agency? N/a  Has the agency set up a time to come to the patient's home? not applicable Were any new equipment or medical supplies ordered?  No What is the name of the medical supply agency? N/a Were you able to get the supplies/equipment? not applicable Do you have any questions related to the use of the equipment or supplies? No  She receives services from the [facility], EMT/[facility]amed[facility] who per patient is coming to see her 09/08/2021.   She has a working Surveyor, minerals.   Functional Questionnaire: (I = Independent and D = Dependent) ADLs: independent  - she said she is ambulating without an assistive device.    Follow up appointments reviewed:  PCP Hospital f/u appt confirmed? Yes  Scheduled to see Juluis Mire, NP on 09/12/2021. [facility] f/u appt confirmed? Yes  Scheduled to see cardiology - 09/27/2021.  Are transportation arrangements needed? No  If their condition worsens, is the pt aware to call PCP or go to the Emergency Dept.? Yes Was the patient provided with contact information for the PCP's office or ED? Yes Was to pt encouraged to call back with questions or concerns? Yes

## 2021-09-07 ENCOUNTER — Encounter: Payer: Self-pay | Admitting: Occupational Therapy

## 2021-09-08 ENCOUNTER — Other Ambulatory Visit (HOSPITAL_COMMUNITY): Payer: Self-pay

## 2021-09-08 MED ORDER — TORSEMIDE 20 MG PO TABS: 40.0000 mg | ORAL_TABLET | Freq: Two times a day (BID) | ORAL | 11 refills | Status: DC

## 2021-09-08 NOTE — Progress Notes (Signed)
Paramedicine Encounter    Patient ID: [name], female    DOB: [birth year], 66 y.o.   MRN: [id number]   Arrived for home visit for [name] who reports feeling much better than she did prior to her admission for COVID. She reports feeling tired and having some residual congestion and cough but overall much better. She is almost done with Amoxcillin. She completed steroid. She is needing her albuterol inhaler. I will request a refill.   Lungs clear on assessment. No lower leg swelling more than her normal.  Vitals obtained and as noted.   I reviewed appointments and confirmed same.  Medications were reviewed.   Torsemide dose is now $Remo'40mg'XZxzI$  BID where she was taking $RemoveBe'100mg'XVyMLHIZM$  daily. She does not have $Remove'20mg'WJLCUgv$  tablet at home only the $Remove'100mg'fxVEwAs$ . I called HF clinic to see if she could take 1/2 tablet of $RemoveB'100mg'IiTWHsAZ$ '50mg'$ ) BID until she picked up the $Remo'20mg'QmxYj$  tablets to start taking $RemoveBefo'40mg'MGlEziFhrcj$  BID. Tamsen Roers NP confirmed and agreed with this plan.   [name] is also missing her Metoprolol. I will call and request refill for same.   Pillbox filled accordingly.   Refills: Albuterol  Torsemide $RemoveBe'20mg'YQnalVYNx$  tabs Metoprolol   CBG today was 598  -she just completed steroid course yesterday  -she admits to drinking a lot of juices  We discussed diabetic management and she agreed with plan. She took 5 units of Novolog at 0945.   Home visit complete. I plan to see [name] in one week.     Patient Care Team: [name], NP as PCP - General (Internal Medicine) Buford Dresser, MD as PCP - Cardiology (Cardiology) Vickie Epley, MD as PCP - Electrophysiology (Cardiology) Charolette Forward, MD as Referring Physician (Internal Medicine)  Patient Active Problem List   Diagnosis Date Noted   Atrial flutter with rapid ventricular response ([place]) 08/26/2021   CKD (chronic kidney disease) stage 4, GFR 15-29 ml/min ([place]) 08/26/2021   Anxiety and depression 08/26/2021   Essential hypertension 08/26/2021   GERD  (gastroesophageal reflux disease) 08/26/2021   Hyperlipidemia 08/26/2021   COPD (chronic obstructive pulmonary disease) ([place]) 07/29/2021   Prolonged QT interval 81/15/7262   Acute systolic CHF (congestive heart failure) ([place]) 07/28/2021   Biventricular heart failure ([place])    Acute on chronic systolic heart failure ([place]) 02/25/2021   Atrial flutter ([place])    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   Encounter for preoperative dental examination    Teeth missing    Gingivitis    Accretions on teeth    CHF exacerbation ([place]) 12/21/2020   Acute on chronic heart failure ([place]) 12/19/2020   Atrial fibrillation, permanent ([place]) 11/02/2020   Acute on chronic combined systolic and diastolic CHF (congestive heart failure) ([place]) 05/06/2020   NICM (nonischemic cardiomyopathy) ([place]) 04/01/2020   Family history of heart disease 04/01/2020   Mitral regurgitation 04/01/2020   Severe tricuspid regurgitation 04/01/2020   Closed right ankle fracture 09/24/2019   DKA, type 2 ([place]) 09/24/2019   [place] (hyperglycemic hyperosmolar nonketotic coma) ([place]) 02/21/2019   Hypokalemia 02/21/2019   Acute kidney injury superimposed on CKD ([place]) 02/21/2019   Acute on chronic left systolic heart failure ([place]) 07/01/2018   Congestive heart failure with left ventricular diastolic dysfunction, acute ([place]) 07/01/2018   Right upper lobe pneumonia 05/14/2018   Atrial fibrillation with RVR ([place]) 05/14/2018   Chronic atrial fibrillation ([place]) 03/55/9741   Diastolic dysfunction 63/84/5364   Diabetes mellitus type 2 in obese ([place]) 12/26/2017  Nausea and vomiting    Chest pain 02/26/2012   OSA (obstructive sleep apnea) 09/21/2011   Hypoxia 09/21/2011   Diabetes mellitus ([place]) 02/04/2010   Morbid obesity ([place]) 02/04/2010   AF (paroxysmal atrial fibrillation) ([place]) 02/04/2010   Gastroparesis 02/04/2010   Acute gastroenteritis 02/04/2010    Current Outpatient Medications:     acetaminophen (TYLENOL) 500 MG tablet, Take 1,000 mg by mouth every 6 (six) hours as needed for mild pain., Disp: , Rfl:    albuterol (VENTOLIN HFA) 108 (90 Base) MCG/ACT inhaler, Inhale 2 puffs into the lungs every 4 (four) hours as needed for wheezing or shortness of breath (Asthma)., Disp: , Rfl:    Alcohol Swabs (B-D SINGLE USE SWABS REGULAR) PADS, 1 each by Other route daily., Disp: 100 each, Rfl: 11   amiodarone (PACERONE) 200 MG tablet, Take 1 tablet (200 mg total) by mouth 2 (two) times daily., Disp: 60 tablet, Rfl: 0   apixaban (ELIQUIS) 5 MG TABS tablet, Take 1 tablet (5 mg total) by mouth 2 (two) times daily., Disp: 180 tablet, Rfl: 3   benzonatate (TESSALON) 200 MG capsule, Take 1 capsule (200 mg total) by mouth 2 (two) times daily as needed for cough., Disp: 20 capsule, Rfl: 0   blood glucose meter kit and supplies KIT, Dispense based on patient and insurance preference. Use up to four times daily as directed., Disp: 1 each, Rfl: 0   blood glucose meter kit and supplies, Dispense based on patient and insurance preference. Use up to four times daily as directed. (FOR ICD-10 E10.9, E11.9)., Disp: 1 each, Rfl: 0   Blood Glucose Monitoring Suppl (ACCU-CHEK GUIDE ME) w/Device KIT, Use as instructed to check blood sugar three times daily. E11.69, Disp: 1 kit, Rfl: 0   busPIRone (BUSPAR) 5 MG tablet, TAKE 1 TABLET BY MOUTH TWICE A DAY (Patient taking differently: Take 5 mg by mouth 2 (two) times daily.), Disp: 180 tablet, Rfl: 1   colchicine 0.6 MG tablet, Take 0.3 mg by mouth daily., Disp: , Rfl:    dexamethasone (DECADRON) 6 MG tablet, Take 1 tablet (6 mg total) by mouth daily for 4 days., Disp: 4 tablet, Rfl: 0   docusate sodium (COLACE) 100 MG capsule, Take 100 mg by mouth 2 (two) times daily., Disp: , Rfl:    DULoxetine (CYMBALTA) 60 MG capsule, Take 1 capsule (60 mg total) by mouth daily., Disp: 90 capsule, Rfl: 3   EPINEPHrine 0.3 mg/0.3 mL IJ SOAJ injection, Inject 0.3 mg into the muscle  as needed for anaphylaxis., Disp: 1 each, Rfl: 1   fluticasone (FLONASE) 50 MCG/ACT nasal spray, Place 2 sprays into both nostrils daily., Disp: 16 g, Rfl: 6   glucose blood (ACCU-CHEK GUIDE) test strip, Use to check blood sugar TID. E11.69, Disp: 100 each, Rfl: 3   guaiFENesin-dextromethorphan (ROBITUSSIN DM) 100-10 MG/5ML syrup, Take 10 mLs by mouth every 4 (four) hours as needed for cough., Disp: 236 mL, Rfl: 1   insulin aspart (NOVOLOG) 100 UNIT/ML injection, Inject 2-10 Units into the skin 3 (three) times daily before meals. Per sliding scale, Disp: , Rfl:    loratadine (CLARITIN) 10 MG tablet, Take 1 tablet (10 mg total) by mouth daily., Disp: 30 tablet, Rfl: 11   metolazone (ZAROXOLYN) 2.5 MG tablet, Take 1 tablet (2.5 mg total) by mouth every Monday, Wednesday, and Friday., Disp: 10 tablet, Rfl: 0   metoprolol succinate (TOPROL XL) 25 MG 24 hr tablet, Take 1.5 tablets (37.5 mg total) by mouth daily., Disp:  30 tablet, Rfl: 11   Multiple Vitamins-Minerals (MULTIVITAMIN WOMEN PO), Take 1 tablet by mouth daily., Disp: , Rfl:    nitroGLYCERIN (NITROSTAT) 0.4 MG SL tablet, Place 1 tablet (0.4 mg total) under the tongue every 5 (five) minutes x 3 doses as needed for chest pain., Disp: 25 tablet, Rfl: 12   pantoprazole (PROTONIX) 40 MG tablet, Take 1 tablet (40 mg total) by mouth daily., Disp: , Rfl:    potassium chloride SA (KLOR-CON M) 20 MEQ tablet, Take 2 tablets (40 mEq total) by mouth daily. Take an additional 20 meq with every dose of METOLAZONE, Disp: , Rfl:    RESTASIS 0.05 % ophthalmic emulsion, Place 1 drop into both eyes 2 (two) times daily., Disp: , Rfl: 12   rosuvastatin (CRESTOR) 10 MG tablet, Take 1 tablet (10 mg total) by mouth every evening., Disp: , Rfl:    torsemide (DEMADEX) 20 MG tablet, Take 2 tablets (40 mg total) by mouth 2 (two) times daily., Disp: 60 tablet, Rfl: 0   TRULICITY 1.5 PR/9.1MB SOPN, INJECT 1.5 MG INTO THE SKIN ONCE A WEEK., Disp: 2 mL, Rfl: 2 Allergies   Allergen Reactions   Bee Pollen Anaphylaxis   Sulfa Antibiotics Itching     Social History   Socioeconomic History   Marital status: Significant Other    Spouse name: Not on file   Number of children: 2   Years of education: Not on file   Highest education level: Some college, no degree  Occupational History   Not on file  Tobacco Use   Smoking status: Never   Smokeless tobacco: Never  Vaping Use   Vaping Use: Never used  Substance and Sexual Activity   Alcohol use: No   Drug use: No   Sexual activity: Not Currently  Other Topics Concern   Not on file  Social History Narrative   Pt lives in [place] with spouse.  2 grown children.   Previously worked in [employer] at [employer].  Now on disability   Social Determinants of Health   Financial Resource Strain: Low Risk    Difficulty of Paying Living Expenses: Not very hard  Food Insecurity: No Food Insecurity   Worried About Charity fundraiser in the Last Year: Never true   Ran Out of Food in the Last Year: Never true  Transportation Needs: No Transportation Needs   Lack of Transportation (Medical): No   Lack of Transportation (Non-Medical): No  Physical Activity: Not on file  Stress: Not on file  Social Connections: Not on file  Intimate Partner Violence: Not on file    Physical Exam Vitals reviewed.  Constitutional:      Appearance: Normal appearance. She is normal weight.  HENT:     Head: Normocephalic.     Nose: Congestion present. No rhinorrhea.     Mouth/Throat:     Mouth: Mucous membranes are moist.     Pharynx: Oropharynx is clear.  Eyes:     Conjunctiva/sclera: Conjunctivae normal.     Pupils: Pupils are equal, round, and reactive to light.  Cardiovascular:     Rate and Rhythm: Normal rate and regular rhythm.     Pulses: Normal pulses.     Heart sounds: Normal heart sounds.  Pulmonary:     Effort: Pulmonary effort is normal. No respiratory distress.     Breath sounds: Normal breath sounds. No  wheezing, rhonchi or rales.  Abdominal:     General: Abdomen is flat.  Palpations: Abdomen is soft.  Musculoskeletal:        General: No swelling. Normal range of motion.     Cervical back: Normal range of motion.     Right lower leg: No edema.     Left lower leg: No edema.  Skin:    General: Skin is warm and dry.     Capillary Refill: Capillary refill takes less than 2 seconds.  Neurological:     General: No focal deficit present.     Mental Status: She is alert. Mental status is at baseline.  Psychiatric:        Mood and Affect: Mood normal.        Future Appointments  Date Time Provider [place]  09/12/2021  9:50 AM [name], NP [facility] None  09/27/2021  9:30 AM Vickie Epley, MD [facility]  10/03/2021  9:00 AM Buford Dresser, MD [facility]  11/16/2021 10:15 AM Marzetta Board, DPM [facility]     ACTION: Home visit completed

## 2021-09-09 ENCOUNTER — Other Ambulatory Visit (HOSPITAL_COMMUNITY): Payer: Self-pay

## 2021-09-09 ENCOUNTER — Other Ambulatory Visit (INDEPENDENT_AMBULATORY_CARE_PROVIDER_SITE_OTHER): Payer: Self-pay | Admitting: Primary Care

## 2021-09-09 DIAGNOSIS — F4323 Adjustment disorder with mixed anxiety and depressed mood: Secondary | ICD-10-CM

## 2021-09-09 MED ORDER — METOPROLOL SUCCINATE ER 25 MG PO TB24: 37.5000 mg | ORAL_TABLET | Freq: Every day | ORAL | 11 refills | Status: DC

## 2021-09-12 ENCOUNTER — Other Ambulatory Visit: Payer: Self-pay

## 2021-09-12 ENCOUNTER — Ambulatory Visit (INDEPENDENT_AMBULATORY_CARE_PROVIDER_SITE_OTHER): Payer: Self-pay

## 2021-09-12 ENCOUNTER — Ambulatory Visit (INDEPENDENT_AMBULATORY_CARE_PROVIDER_SITE_OTHER): Payer: 59 | Admitting: Primary Care

## 2021-09-12 DIAGNOSIS — U071 COVID-19: Secondary | ICD-10-CM

## 2021-09-12 DIAGNOSIS — Z09 Encounter for follow-up examination after completed treatment for conditions other than malignant neoplasm: Secondary | ICD-10-CM

## 2021-09-12 NOTE — Telephone Encounter (Signed)
° ° ° °  Chief Complaint: Weakness Symptoms: Weak, nausea, short of breath with exertion Frequency: Started 1 week ago. Recently hospitalized. Pertinent Negatives: Patient denies chest pain. Disposition: [] ED /[] Urgent Care (no appt availability in office) / [] Appointment(In office/virtual)/ []  [facility]/ [] Home Care/ [] Refused Recommended Disposition /[] [facility]/ []  Follow-up with PCP Additional Notes: Requests in office appointment be changed to virtual.  Answer Assessment - Initial Assessment Questions 1. DESCRIPTION: "Describe how you are feeling."     Weak 2. SEVERITY: "How bad is it?"  "Can you stand and walk?"   - MILD - Feels weak or tired, but does not interfere with work, school or normal activities   - [place]ine to stand and walk; weakness interferes with work, school, or normal activities   - SEVERE - Unable to stand or walk     Moderate 3. ONSET:  "When did the weakness begin?"     1 week ago 4. CAUSE: "What do you think is causing the weakness?"     Was recently in hospital 5. MEDICINES: "Have you recently started a new medicine or had a change in the amount of a medicine?"     No 6. OTHER SYMPTOMS: "Do you have any other symptoms?" (e.g., chest pain, fever, cough, SOB, vomiting, diarrhea, bleeding, other areas of pain)     Nausea, weak, not sleeping,cough 7. PREGNANCY: "Is there any chance you are pregnant?" "When was your last menstrual period?"     No  Protocols used: Weakness (Generalized) and Fatigue-A-AH

## 2021-09-12 NOTE — Progress Notes (Signed)
[facility] Telephone Note  I connected with [name], on 09/12/2021 at 9:17 AM  by telephone and verified that I am speaking with the correct person using two identifiers.   Consent: I discussed the limitations, risks, security and privacy concerns of performing an evaluation and management service by telephone and the availability of in person appointments. I also discussed with the patient that there may be a patient responsible charge related to this service. The patient expressed understanding and agreed to proceed.   Location of Patient: Home  Location of Provider: [facility] [place]   Persons participating in Telemedicine visit: R[place],  NP Llana Aliment , CMA     History of Present Illness: [name] is a 66 y.o. female presents for hospital follow up. She presented to the ED with chest pain and shortness of breath x 3 days. She stated the chest pain was over the left chest wall and felt like an elephant on her chest and radiated toward her left arm. There were no alleviating or aggravating factors associated with the pain.Admit date to the hospital was 08/26/21, patient was discharged from the hospital on 09/04/21, patient was admitted for: Acute on chronic combined systolic and diastolic CHF (congestive heart failure) (HCC),COPD (chronic obstructive pulmonary disease) (HCC) and Atrial flutter with rapid ventricular response ([place]). Today she is tired and weak. Expressed she has lost her sense of taste after COVID.  Past Medical History:  Diagnosis Date   Allergic rhinitis    Arthritis    Asthma    Chest pain 12/27/2017   Chronic diastolic CHF (congestive heart failure) (HCC)    COPD (chronic obstructive pulmonary disease) (HCC)    Depression    DM (diabetes mellitus) ([place])    DVT (deep vein thrombosis) in pregnancy    Dysrhythmia    atrial fibrilation    GERD (gastroesophageal reflux disease)    Headache(784.0)    HTN (hypertension)    Hyperlipidemia    Obesity    Pneumonia 04/2018   RIGHT LOBE   Shortness of breath    Sleep apnea    compliant with CPAP   Allergies  Allergen Reactions   Bee Pollen Anaphylaxis   Sulfa Antibiotics Itching    Current Outpatient Medications on File Prior to Visit  Medication Sig Dispense Refill   acetaminophen (TYLENOL) 500 MG tablet Take 1,000 mg by mouth every 6 (six) hours as needed for mild pain.     albuterol (VENTOLIN HFA) 108 (90 Base) MCG/ACT inhaler Inhale 2 puffs into the lungs every 4 (four) hours as needed for wheezing or shortness of breath (Asthma).     Alcohol Swabs (B-D SINGLE USE SWABS REGULAR) PADS 1 each by Other route daily. 100 each 11   amiodarone (PACERONE) 200 MG tablet Take 1 tablet (200 mg total) by mouth 2 (two) times daily. 60 tablet 0   apixaban (ELIQUIS) 5 MG TABS tablet Take 1 tablet (5 mg total) by mouth 2 (two) times daily. 180 tablet 3   benzonatate (TESSALON) 200 MG capsule Take 1 capsule (200 mg total) by mouth 2 (two) times daily as needed for cough. 20 capsule 0   blood glucose meter kit and supplies KIT Dispense based on patient and insurance preference. Use up to four times daily as directed. 1 each 0   blood glucose meter kit and supplies Dispense based on patient and insurance preference. Use up to four times daily as directed. (  FOR ICD-10 E10.9, E11.9). 1 each 0   Blood Glucose Monitoring Suppl (ACCU-CHEK GUIDE ME) w/Device KIT Use as instructed to check blood sugar three times daily. E11.69 1 kit 0   busPIRone (BUSPAR) 5 MG tablet TAKE 1 TABLET BY MOUTH TWICE A DAY (Patient taking differently: Take 5 mg by mouth 2 (two) times daily.) 180 tablet 1   colchicine 0.6 MG tablet Take 0.3 mg by mouth daily.     docusate sodium (COLACE) 100 MG capsule Take 100 mg by mouth 2 (two) times daily.     DULoxetine (CYMBALTA) 60 MG capsule Take 1 capsule (60 mg total) by  mouth daily. 90 capsule 3   EPINEPHrine 0.3 mg/0.3 mL IJ SOAJ injection Inject 0.3 mg into the muscle as needed for anaphylaxis. 1 each 1   fluticasone (FLONASE) 50 MCG/ACT nasal spray Place 2 sprays into both nostrils daily. 16 g 6   glucose blood (ACCU-CHEK GUIDE) test strip Use to check blood sugar TID. E11.69 100 each 3   guaiFENesin-dextromethorphan (ROBITUSSIN DM) 100-10 MG/5ML syrup Take 10 mLs by mouth every 4 (four) hours as needed for cough. 236 mL 1   insulin aspart (NOVOLOG) 100 UNIT/ML injection Inject 2-10 Units into the skin 3 (three) times daily before meals. Per sliding scale     loratadine (CLARITIN) 10 MG tablet Take 1 tablet (10 mg total) by mouth daily. 30 tablet 11   metolazone (ZAROXOLYN) 2.5 MG tablet Take 1 tablet (2.5 mg total) by mouth every Monday, Wednesday, and Friday. 10 tablet 0   metoprolol succinate (TOPROL XL) 25 MG 24 hr tablet Take 1.5 tablets (37.5 mg total) by mouth daily. 30 tablet 11   Multiple Vitamins-Minerals (MULTIVITAMIN WOMEN PO) Take 1 tablet by mouth daily.     nitroGLYCERIN (NITROSTAT) 0.4 MG SL tablet Place 1 tablet (0.4 mg total) under the tongue every 5 (five) minutes x 3 doses as needed for chest pain. 25 tablet 12   pantoprazole (PROTONIX) 40 MG tablet Take 1 tablet (40 mg total) by mouth daily.     potassium chloride SA (KLOR-CON M) 20 MEQ tablet Take 2 tablets (40 mEq total) by mouth daily. Take an additional 20 meq with every dose of METOLAZONE     RESTASIS 0.05 % ophthalmic emulsion Place 1 drop into both eyes 2 (two) times daily.  12   rosuvastatin (CRESTOR) 10 MG tablet Take 1 tablet (10 mg total) by mouth every evening.     torsemide (DEMADEX) 20 MG tablet Take 2 tablets (40 mg total) by mouth 2 (two) times daily. 973 tablet 11   TRULICITY 1.5 ZH/2.9JM SOPN INJECT 1.5 MG INTO THE SKIN ONCE A WEEK. 2 mL 2   No current facility-administered medications on file prior to visit.    Observations/Objective: There were no vitals taken for  this visit.  Comprehensive ROS Pertinent positive and negative noted in HPI   Assessment and Plan: [name] was seen today for hospitalization follow-up.  Diagnoses and all orders for this visit:  Hospital discharge follow-up Retrieved from hospital discharge Schedule an appointment with [name], NP (Internal Medicine) in 1 week (09/02/2021); With repeat CBC/BMP- virtual no labs completed schedule for future. Follow up with Buford Dresser, MD (Cardiology) Follow up with Vickie Epley, MD (Cardiology) Schedule an appointment with [place] in 1 week (09/02/2021)  COVID Side effect discussed how to retrain brain and hopefully regained taste buds    Follow Up Instructions:  39month A1C 6.5 2 weeks ago  I discussed the assessment and treatment plan with the patient. The patient was provided an opportunity to ask questions and all were answered. The patient agreed with the plan and demonstrated an understanding of the instructions.   The patient was advised to call back or seek an in-person evaluation if the symptoms worsen or if the condition fails to improve as anticipated.     I provided 15 minutes total of non-face-to-face time during this encounter including median intraservice time, reviewing previous notes, investigations, ordering medications, medical decision making, coordinating care and patient verbalized understanding at the end of the visit.  This note has been created with Surveyor, quantity. Any transcriptional errors are unintentional.   [name], NP 09/12/2021, 9:18 AM

## 2021-09-13 ENCOUNTER — Other Ambulatory Visit (INDEPENDENT_AMBULATORY_CARE_PROVIDER_SITE_OTHER): Payer: Self-pay | Admitting: Primary Care

## 2021-09-13 DIAGNOSIS — E669 Obesity, unspecified: Secondary | ICD-10-CM

## 2021-09-13 DIAGNOSIS — F4323 Adjustment disorder with mixed anxiety and depressed mood: Secondary | ICD-10-CM

## 2021-09-13 DIAGNOSIS — E1169 Type 2 diabetes mellitus with other specified complication: Secondary | ICD-10-CM

## 2021-09-13 NOTE — Telephone Encounter (Signed)
Sent to PCP ?

## 2021-09-14 ENCOUNTER — Other Ambulatory Visit (INDEPENDENT_AMBULATORY_CARE_PROVIDER_SITE_OTHER): Payer: Self-pay | Admitting: Primary Care

## 2021-09-14 DIAGNOSIS — E1142 Type 2 diabetes mellitus with diabetic polyneuropathy: Secondary | ICD-10-CM

## 2021-09-14 DIAGNOSIS — J0110 Acute frontal sinusitis, unspecified: Secondary | ICD-10-CM

## 2021-09-14 DIAGNOSIS — K3184 Gastroparesis: Secondary | ICD-10-CM

## 2021-09-14 DIAGNOSIS — Z760 Encounter for issue of repeat prescription: Secondary | ICD-10-CM

## 2021-09-14 DIAGNOSIS — F32A Depression, unspecified: Secondary | ICD-10-CM

## 2021-09-14 MED ORDER — ALCOHOL SWABS PADS: 1.0000 "application " | MEDICATED_PAD | Freq: Three times a day (TID) | 11 refills | Status: DC

## 2021-09-14 MED ORDER — LORATADINE 10 MG PO TABS: 10.0000 mg | ORAL_TABLET | Freq: Every day | ORAL | 11 refills | Status: DC

## 2021-09-14 MED ORDER — PANTOPRAZOLE SODIUM 40 MG PO TBEC: 40.0000 mg | DELAYED_RELEASE_TABLET | Freq: Every day | ORAL | 0 refills | Status: DC

## 2021-09-14 MED ORDER — DULOXETINE HCL 60 MG PO CPEP: 60.0000 mg | ORAL_CAPSULE | Freq: Every day | ORAL | 3 refills | Status: DC

## 2021-09-14 NOTE — Telephone Encounter (Signed)
[facility] calling to check status. I also called the patient and she stated that she has not picked [facility] because [facility] are having trouble getting medication so [facility]   [facility] - [place], [address] Phone:  [phone]  Fax:  [phone]      Also when speaking with patient she asked that her Albuterol inhaler be sent to [facility] local [facility]  Please advise   [id number] [address] [place] Phone:  [phone]  Fax:  [phone]

## 2021-09-14 NOTE — Telephone Encounter (Signed)
Patient requesting a prescription for an albuterol inhaler. States she was recently discharged from the hospital with [facility]. She does not need refill of buspirone as she just picked it up yesterday 09/13/21. [facility] [address]

## 2021-09-15 ENCOUNTER — Other Ambulatory Visit (HOSPITAL_COMMUNITY): Payer: Self-pay

## 2021-09-15 NOTE — Progress Notes (Signed)
Paramedicine Encounter    Patient ID: [name], female    DOB: [birth year], 66 y.o.   MRN: [id number]  Arrived for home visit for [name] who reports feeling "okay", but says she still has some residual congestion and coughing which is causing some pain in her rib area on the left side.   Vitals and assessment completed. Lungs clear. No swelling noted. Weight is down.  I obtained blood sugar and it was reading HI (>500) she reports she took her insulin this morning with breakfast. 5 units.  She states she had chickfila chicken biscuit, hashbrowns and an tea/lemonade. I discussed with her the dangers of hyperglycemia and the importance of checking her sugar often as well as controlling her diet. She verbalized understanding and plans to take insulin after home visit.   I reviewed upcoming appointments and confirmed same.   I reviewed medications and confirmed them filling pill box for one week. [name] made aware of my absence next Thursday and that she can do her own pill box. Med bottles labeled and list left with her at home.  Home visit complete. I will see [name] in two weeks.   I called her at 1650 to recheck her sugar  & it was 414. I encouraged her to be sure to drink plenty of water, reduce sugar and to avoid carbs for dinner. She agreed and plans to take 5 more units at supper. If high blood sugar persists I advised her to contact her PCP. She agreed.    Patient Care Team: [name], NP as PCP - General (Internal Medicine) Buford Dresser, MD as PCP - Cardiology (Cardiology) Vickie Epley, MD as PCP - Electrophysiology (Cardiology) Charolette Forward, MD as Referring Physician (Internal Medicine)  Patient Active Problem List   Diagnosis Date Noted   Atrial flutter with rapid ventricular response ([place]) 08/26/2021   CKD (chronic kidney disease) stage 4, GFR 15-29 ml/min ([place]) 08/26/2021   Anxiety and depression 08/26/2021   Essential hypertension  08/26/2021   GERD (gastroesophageal reflux disease) 08/26/2021   Hyperlipidemia 08/26/2021   COPD (chronic obstructive pulmonary disease) ([place]) 07/29/2021   Prolonged QT interval 41/66/0630   Acute systolic CHF (congestive heart failure) ([place]) 07/28/2021   Biventricular heart failure ([place])    Acute on chronic systolic heart failure ([place]) 02/25/2021   Atrial flutter ([place])    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   Encounter for preoperative dental examination    Teeth missing    Gingivitis    Accretions on teeth    CHF exacerbation ([place]) 12/21/2020   Acute on chronic heart failure ([place]) 12/19/2020   Atrial fibrillation, permanent ([place]) 11/02/2020   Acute on chronic combined systolic and diastolic CHF (congestive heart failure) ([place]) 05/06/2020   NICM (nonischemic cardiomyopathy) ([place]) 04/01/2020   Family history of heart disease 04/01/2020   Mitral regurgitation 04/01/2020   Severe tricuspid regurgitation 04/01/2020   Closed right ankle fracture 09/24/2019   DKA, type 2 ([place]) 09/24/2019   [place] (hyperglycemic hyperosmolar nonketotic coma) ([place]) 02/21/2019   Hypokalemia 02/21/2019   Acute kidney injury superimposed on CKD ([place]) 02/21/2019   Acute on chronic left systolic heart failure ([place]) 07/01/2018   Congestive heart failure with left ventricular diastolic dysfunction, acute ([place]) 07/01/2018   Right upper lobe pneumonia 05/14/2018   Atrial fibrillation with RVR ([place]) 05/14/2018   Chronic atrial fibrillation ([place]) 16/08/930   Diastolic dysfunction 35/57/3220   Diabetes mellitus type 2 in obese ([place])  12/26/2017   Nausea and vomiting    Chest pain 02/26/2012   OSA (obstructive sleep apnea) 09/21/2011   Hypoxia 09/21/2011   Diabetes mellitus ([place]) 02/04/2010   Morbid obesity ([place]) 02/04/2010   AF (paroxysmal atrial fibrillation) ([place]) 02/04/2010   Gastroparesis 02/04/2010   Acute gastroenteritis 02/04/2010    Current Outpatient  Medications:    acetaminophen (TYLENOL) 500 MG tablet, Take 1,000 mg by mouth every 6 (six) hours as needed for mild pain., Disp: , Rfl:    Alcohol Swabs (B-D SINGLE USE SWABS REGULAR) PADS, 1 each by Other route daily., Disp: 100 each, Rfl: 11   Alcohol Swabs PADS, 1 application by Does not apply route in the morning, at noon, and at bedtime., Disp: 120 each, Rfl: 11   amiodarone (PACERONE) 200 MG tablet, Take 1 tablet (200 mg total) by mouth 2 (two) times daily., Disp: 60 tablet, Rfl: 0   apixaban (ELIQUIS) 5 MG TABS tablet, Take 1 tablet (5 mg total) by mouth 2 (two) times daily., Disp: 180 tablet, Rfl: 3   benzonatate (TESSALON) 200 MG capsule, Take 1 capsule (200 mg total) by mouth 2 (two) times daily as needed for cough., Disp: 20 capsule, Rfl: 0   blood glucose meter kit and supplies KIT, Dispense based on patient and insurance preference. Use up to four times daily as directed., Disp: 1 each, Rfl: 0   blood glucose meter kit and supplies, Dispense based on patient and insurance preference. Use up to four times daily as directed. (FOR ICD-10 E10.9, E11.9)., Disp: 1 each, Rfl: 0   Blood Glucose Monitoring Suppl (ACCU-CHEK GUIDE ME) w/Device KIT, Use as instructed to check blood sugar three times daily. E11.69, Disp: 1 kit, Rfl: 0   busPIRone (BUSPAR) 5 MG tablet, Take 1 tablet (5 mg total) by mouth 2 (two) times daily., Disp: 180 tablet, Rfl: 1   colchicine 0.6 MG tablet, Take 0.3 mg by mouth daily., Disp: , Rfl:    docusate sodium (COLACE) 100 MG capsule, Take 100 mg by mouth 2 (two) times daily., Disp: , Rfl:    DULoxetine (CYMBALTA) 60 MG capsule, Take 1 capsule (60 mg total) by mouth daily., Disp: 90 capsule, Rfl: 3   EPINEPHrine 0.3 mg/0.3 mL IJ SOAJ injection, Inject 0.3 mg into the muscle as needed for anaphylaxis., Disp: 1 each, Rfl: 1   fluticasone (FLONASE) 50 MCG/ACT nasal spray, Place 2 sprays into both nostrils daily., Disp: 16 g, Rfl: 6   glucose blood (ACCU-CHEK GUIDE) test  strip, Use to check blood sugar TID. E11.69, Disp: 100 each, Rfl: 3   guaiFENesin-dextromethorphan (ROBITUSSIN DM) 100-10 MG/5ML syrup, Take 10 mLs by mouth every 4 (four) hours as needed for cough., Disp: 236 mL, Rfl: 1   insulin aspart (NOVOLOG) 100 UNIT/ML injection, Inject 2-10 Units into the skin 3 (three) times daily before meals. Per sliding scale, Disp: , Rfl:    loratadine (CLARITIN) 10 MG tablet, Take 1 tablet (10 mg total) by mouth daily., Disp: 30 tablet, Rfl: 11   metolazone (ZAROXOLYN) 2.5 MG tablet, Take 1 tablet (2.5 mg total) by mouth every Monday, Wednesday, and Friday., Disp: 10 tablet, Rfl: 0   metoprolol succinate (TOPROL XL) 25 MG 24 hr tablet, Take 1.5 tablets (37.5 mg total) by mouth daily., Disp: 30 tablet, Rfl: 11   Multiple Vitamins-Minerals (MULTIVITAMIN WOMEN PO), Take 1 tablet by mouth daily., Disp: , Rfl:    nitroGLYCERIN (NITROSTAT) 0.4 MG SL tablet, Place 1 tablet (0.4 mg total) under the  tongue every 5 (five) minutes x 3 doses as needed for chest pain., Disp: 25 tablet, Rfl: 12   pantoprazole (PROTONIX) 40 MG tablet, Take 1 tablet (40 mg total) by mouth daily., Disp: 90 tablet, Rfl: 0   potassium chloride SA (KLOR-CON M) 20 MEQ tablet, Take 2 tablets (40 mEq total) by mouth daily. Take an additional 20 meq with every dose of METOLAZONE, Disp: , Rfl:    RESTASIS 0.05 % ophthalmic emulsion, Place 1 drop into both eyes 2 (two) times daily., Disp: , Rfl: 12   rosuvastatin (CRESTOR) 10 MG tablet, Take 1 tablet (10 mg total) by mouth every evening., Disp: , Rfl:    torsemide (DEMADEX) 20 MG tablet, Take 2 tablets (40 mg total) by mouth 2 (two) times daily., Disp: 120 tablet, Rfl: 11   TRULICITY 1.5 ES/9.2ZR SOPN, INJECT 1.5 MG INTO THE SKIN ONCE A WEEK., Disp: 2 mL, Rfl: 2 Allergies  Allergen Reactions   Bee Pollen Anaphylaxis   Sulfa Antibiotics Itching     Social History   Socioeconomic History   Marital status: Significant Other    Spouse name: Not on file    Number of children: 2   Years of education: Not on file   Highest education level: Some college, no degree  Occupational History   Not on file  Tobacco Use   Smoking status: Never   Smokeless tobacco: Never  Vaping Use   Vaping Use: Never used  Substance and Sexual Activity   Alcohol use: No   Drug use: No   Sexual activity: Not Currently  Other Topics Concern   Not on file  Social History Narrative   Pt lives in [place] with spouse.  2 grown children.   Previously worked in [employer] at [employer].  Now on disability   Social Determinants of Health   Financial Resource Strain: Low Risk    Difficulty of Paying Living Expenses: Not very hard  Food Insecurity: No Food Insecurity   Worried About Charity fundraiser in the Last Year: Never true   Ran Out of Food in the Last Year: Never true  Transportation Needs: No Transportation Needs   Lack of Transportation (Medical): No   Lack of Transportation (Non-Medical): No  Physical Activity: Not on file  Stress: Not on file  Social Connections: Not on file  Intimate Partner Violence: Not on file    Physical Exam Constitutional:      Appearance: Normal appearance. She is normal weight.  HENT:     Head: Normocephalic.     Nose: Congestion present.     Mouth/Throat:     Mouth: Mucous membranes are moist.     Pharynx: Oropharynx is clear.  Eyes:     Conjunctiva/sclera: Conjunctivae normal.     Pupils: Pupils are equal, round, and reactive to light.  Cardiovascular:     Rate and Rhythm: Normal rate and regular rhythm.     Pulses: Normal pulses.     Heart sounds: Normal heart sounds.  Pulmonary:     Effort: Pulmonary effort is normal. No respiratory distress.     Breath sounds: Normal breath sounds. No stridor. No wheezing, rhonchi or rales.  Abdominal:     General: Abdomen is flat.     Palpations: Abdomen is soft.  Musculoskeletal:        General: No swelling. Normal range of motion.     Cervical back: Normal range of  motion.     Right lower leg: No edema.  Left lower leg: No edema.  Skin:    General: Skin is warm and dry.     Capillary Refill: Capillary refill takes less than 2 seconds.  Neurological:     General: No focal deficit present.     Mental Status: She is alert. Mental status is at baseline.  Psychiatric:        Mood and Affect: Mood normal.        Future Appointments  Date Time Provider [place]  09/27/2021  9:30 AM Vickie Epley, MD [facility]  10/03/2021  9:00 AM Buford Dresser, MD [facility]  11/16/2021 10:15 AM Marzetta Board, DPM [facility]  12/14/2021 11:00 AM Bensimhon, Shaune Pascal, MD [facility] None     ACTION: Home visit completed

## 2021-09-16 ENCOUNTER — Other Ambulatory Visit (INDEPENDENT_AMBULATORY_CARE_PROVIDER_SITE_OTHER): Payer: Self-pay | Admitting: Primary Care

## 2021-09-19 ENCOUNTER — Encounter (HOSPITAL_BASED_OUTPATIENT_CLINIC_OR_DEPARTMENT_OTHER): Payer: Self-pay

## 2021-09-19 ENCOUNTER — Other Ambulatory Visit (INDEPENDENT_AMBULATORY_CARE_PROVIDER_SITE_OTHER): Payer: Self-pay | Admitting: Primary Care

## 2021-09-19 ENCOUNTER — Emergency Department (HOSPITAL_BASED_OUTPATIENT_CLINIC_OR_DEPARTMENT_OTHER): Payer: 59

## 2021-09-19 ENCOUNTER — Other Ambulatory Visit: Payer: Self-pay

## 2021-09-19 ENCOUNTER — Emergency Department (HOSPITAL_BASED_OUTPATIENT_CLINIC_OR_DEPARTMENT_OTHER)
Admission: EM | Admit: 2021-09-19 | Discharge: 2021-09-19 | Disposition: A | Discharge status: Home / Self Care | Payer: 59 | Attending: Emergency Medicine | Admitting: Emergency Medicine

## 2021-09-19 DIAGNOSIS — R0602 Shortness of breath: Secondary | ICD-10-CM | POA: Insufficient documentation

## 2021-09-19 DIAGNOSIS — Z8616 Personal history of COVID-19: Secondary | ICD-10-CM | POA: Diagnosis not present

## 2021-09-19 DIAGNOSIS — R079 Chest pain, unspecified: Secondary | ICD-10-CM | POA: Diagnosis present

## 2021-09-19 DIAGNOSIS — Z7901 Long term (current) use of anticoagulants: Secondary | ICD-10-CM | POA: Diagnosis not present

## 2021-09-19 DIAGNOSIS — Z794 Long term (current) use of insulin: Secondary | ICD-10-CM | POA: Diagnosis not present

## 2021-09-19 DIAGNOSIS — F4323 Adjustment disorder with mixed anxiety and depressed mood: Secondary | ICD-10-CM

## 2021-09-19 DIAGNOSIS — R059 Cough, unspecified: Secondary | ICD-10-CM | POA: Insufficient documentation

## 2021-09-19 DIAGNOSIS — Z79899 Other long term (current) drug therapy: Secondary | ICD-10-CM | POA: Insufficient documentation

## 2021-09-19 LAB — BASIC METABOLIC PANEL
Anion gap: 11 (ref 5–15)
BUN: 43 mg/dL — ABNORMAL HIGH (ref 8–23)
CO2: 36 mmol/L — ABNORMAL HIGH (ref 22–32)
Calcium: 9.6 mg/dL (ref 8.9–10.3)
Chloride: 90 mmol/L — ABNORMAL LOW (ref 98–111)
Creatinine, Ser: 2.77 mg/dL — ABNORMAL HIGH (ref 0.44–1.00)
GFR, Estimated: 18 mL/min — ABNORMAL LOW (ref >60)
Glucose, Bld: 340 mg/dL — ABNORMAL HIGH (ref 70–99)
Potassium: 3.2 mmol/L — ABNORMAL LOW (ref 3.5–5.1)
Sodium: 137 mmol/L (ref 135–145)

## 2021-09-19 LAB — CBC
HCT: 40.5 % (ref 36.0–46.0)
Hemoglobin: 13.2 g/dL (ref 12.0–15.0)
MCH: 28 pg (ref 26.0–34.0)
MCHC: 32.6 g/dL (ref 30.0–36.0)
MCV: 85.8 fL (ref 80.0–100.0)
Platelets: 152 10*3/uL (ref 150–400)
RBC: 4.72 MIL/uL (ref 3.87–5.11)
RDW: 13.1 % (ref 11.5–15.5)
WBC: 7.8 10*3/uL (ref 4.0–10.5)
nRBC: 0 % (ref 0.0–0.2)

## 2021-09-19 LAB — TROPONIN I (HIGH SENSITIVITY)
Troponin I (High Sensitivity): 7 ng/L (ref <18)
Troponin I (High Sensitivity): 7 ng/L (ref <18)

## 2021-09-19 LAB — D-DIMER, QUANTITATIVE: D-Dimer, Quant: <0.27 ug/mL-FEU (ref 0.00–0.50)

## 2021-09-19 LAB — PROTIME-INR
INR: 1.9 — ABNORMAL HIGH (ref 0.8–1.2)
Prothrombin Time: 21.5 seconds — ABNORMAL HIGH (ref 11.4–15.2)

## 2021-09-19 MED ORDER — POTASSIUM CHLORIDE CRYS ER 20 MEQ PO TBCR
40.0000 meq | EXTENDED_RELEASE_TABLET | Freq: Once | ORAL | Status: AC
  Administered 2021-09-19: 40 meq via ORAL
  Filled 2021-09-19: qty 2

## 2021-09-19 MED ORDER — ACETAMINOPHEN 325 MG PO TABS
650.0000 mg | ORAL_TABLET | Freq: Once | ORAL | Status: AC
  Administered 2021-09-19: 650 mg via ORAL
  Filled 2021-09-19: qty 2

## 2021-09-19 NOTE — Discharge Instructions (Addendum)
Call your primary care doctor or specialist as discussed in the next 2-3 days.   Return immediately back to the ER if:  Your symptoms worsen within the next 12-24 hours. You develop new symptoms such as new fevers, persistent vomiting, new pain, shortness of breath, or new weakness or numbness, or if you have any other concerns.  

## 2021-09-19 NOTE — ED Triage Notes (Signed)
Pt presents with Left side chest pressure and SOB x1 day. Pt also presents with a congested cough x1 week. Pt denies being around any sick family. Pt tested positive for Covid 09/03/2021

## 2021-09-19 NOTE — ED Provider Notes (Signed)
[facility] EMERGENCY DEPT Provider Note   CSN: [id number] Arrival date & time: 09/19/21  5701     History  Chief Complaint  Patient presents with   Chest Pain   Shortness of Breath   Cough    Shaneta Cervenka is a 66 y.o. female.  Patient presents chief complaint of shortness of breath and chest pain and cough.  She states she had a cough for least a week she was diagnosed with COVID approximately 2 weeks ago and has had a persistent cough now.  Denies fevers.  Denies vomiting or diarrhea.  Describes her chest pain has been chest ache ongoing for 2 days continuously now.      Home Medications Prior to Admission medications   Medication Sig Start Date End Date Taking? Authorizing Provider  acetaminophen (TYLENOL) 500 MG tablet Take 1,000 mg by mouth every 6 (six) hours as needed for mild pain.    [provider]  Alcohol Swabs (B-D SINGLE USE SWABS REGULAR) PADS 1 each by Other route daily. 07/06/21   [name], NP  Alcohol Swabs PADS 1 application by Does not apply route in the morning, at noon, and at bedtime. 09/14/21   [name], NP  amiodarone (PACERONE) 200 MG tablet Take 1 tablet (200 mg total) by mouth 2 (two) times daily. 09/02/21   Aline August, MD  apixaban (ELIQUIS) 5 MG TABS tablet Take 1 tablet (5 mg total) by mouth 2 (two) times daily. 05/16/21   [place], Maricela Bo, FNP  benzonatate (TESSALON) 200 MG capsule Take 1 capsule (200 mg total) by mouth 2 (two) times daily as needed for cough. 09/02/21   Aline August, MD  blood glucose meter kit and supplies KIT Dispense based on patient and insurance preference. Use up to four times daily as directed. 02/10/21   Patrecia Pour, MD  blood glucose meter kit and supplies Dispense based on patient and insurance preference. Use up to four times daily as directed. (FOR ICD-10 E10.9, E11.9). 07/06/21   [name], NP  Blood Glucose Monitoring Suppl (ACCU-CHEK GUIDE ME)  w/Device KIT Use as instructed to check blood sugar three times daily. E11.69 04/21/20   Charlott Rakes, MD  busPIRone (BUSPAR) 5 MG tablet Take 1 tablet (5 mg total) by mouth 2 (two) times daily. 09/14/21   [name], NP  colchicine 0.6 MG tablet Take 0.3 mg by mouth daily.    [provider]  docusate sodium (COLACE) 100 MG capsule Take 100 mg by mouth 2 (two) times daily.    [provider]  DULoxetine (CYMBALTA) 60 MG capsule Take 1 capsule (60 mg total) by mouth daily. 09/14/21   [name], NP  EPINEPHrine 0.3 mg/0.3 mL IJ SOAJ injection Inject 0.3 mg into the muscle as needed for anaphylaxis. 06/25/20   [name], NP  fluticasone (FLONASE) 50 MCG/ACT nasal spray Place 2 sprays into both nostrils daily. 05/24/21   [name], NP  glucose blood (ACCU-CHEK GUIDE) test strip Use to check blood sugar TID. E11.69 07/13/21   Charlott Rakes, MD  guaiFENesin-dextromethorphan (ROBITUSSIN DM) 100-10 MG/5ML syrup Take 10 mLs by mouth every 4 (four) hours as needed for cough. 09/02/21   Aline August, MD  insulin aspart (NOVOLOG) 100 UNIT/ML injection Inject 2-10 Units into the skin 3 (three) times daily before meals. Per sliding scale 09/02/21   Aline August, MD  loratadine (CLARITIN) 10 MG tablet Take 1 tablet (10 mg total) by mouth  daily. Patient not taking: Reported on 09/15/2021 09/14/21   [name], NP  metolazone (ZAROXOLYN) 2.5 MG tablet Take 1 tablet (2.5 mg total) by mouth every Monday, Wednesday, and Friday. 09/07/21   Aline August, MD  metoprolol succinate (TOPROL XL) 25 MG 24 hr tablet Take 1.5 tablets (37.5 mg total) by mouth daily. 09/09/21 09/09/22  Bensimhon, Shaune Pascal, MD  Multiple Vitamins-Minerals (MULTIVITAMIN WOMEN PO) Take 1 tablet by mouth daily.    [provider]  nitroGLYCERIN (NITROSTAT) 0.4 MG SL tablet Place 1 tablet (0.4 mg total) under the tongue every 5 (five) minutes x 3 doses as needed for chest pain.  01/08/14   Charolette Forward, MD  pantoprazole (PROTONIX) 40 MG tablet Take 1 tablet (40 mg total) by mouth daily. 09/14/21   [name], NP  potassium chloride SA (KLOR-CON M) 20 MEQ tablet Take 2 tablets (40 mEq total) by mouth daily. Take an additional 20 meq with every dose of METOLAZONE 09/02/21   Aline August, MD  RESTASIS 0.05 % ophthalmic emulsion Place 1 drop into both eyes 2 (two) times daily. 11/19/17   [provider]  rosuvastatin (CRESTOR) 10 MG tablet Take 1 tablet (10 mg total) by mouth every evening. 09/02/21   Aline August, MD  torsemide (DEMADEX) 20 MG tablet Take 2 tablets (40 mg total) by mouth 2 (two) times daily. 09/08/21   Bensimhon, Shaune Pascal, MD  TRULICITY 1.5 KK/9.3GH SOPN INJECT 1.5 MG INTO THE SKIN ONCE A WEEK. 08/19/21 11/17/21  [name], NP      Allergies    Bee pollen and Sulfa antibiotics    Review of Systems   Review of Systems  Constitutional:  Negative for fever.  HENT:  Negative for ear pain.   Eyes:  Negative for pain.  Respiratory:  Positive for cough and shortness of breath.   Cardiovascular:  Positive for chest pain.  Gastrointestinal:  Negative for abdominal pain.  Genitourinary:  Negative for flank pain.  Musculoskeletal:  Negative for back pain.  Skin:  Negative for rash.  Neurological:  Negative for headaches.   Physical Exam Updated Vital Signs BP 102/67    Pulse 84    Temp 98.2 F (36.8 C) (Oral)    Resp 14    SpO2 98%  Physical Exam Constitutional:      General: She is not in acute distress.    Appearance: Normal appearance.  HENT:     Head: Normocephalic.     Nose: Nose normal.  Eyes:     Extraocular Movements: Extraocular movements intact.  Cardiovascular:     Rate and Rhythm: Normal rate.  Pulmonary:     Effort: Pulmonary effort is normal.  Musculoskeletal:        General: Normal range of motion.     Cervical back: Normal range of motion.  Neurological:     General: No focal deficit present.      Mental Status: She is alert. Mental status is at baseline.    ED Results / Procedures / Treatments   Labs (all labs ordered are listed, but only abnormal results are displayed) Labs Reviewed  BASIC METABOLIC PANEL - Abnormal; Notable for the following components:      Result Value   Potassium 3.2 (*)    Chloride 90 (*)    CO2 36 (*)    Glucose, Bld 340 (*)    BUN 43 (*)    Creatinine, Ser 2.77 (*)    GFR, Estimated 18 (*)  All other components within normal limits  PROTIME-INR - Abnormal; Notable for the following components:   Prothrombin Time 21.5 (*)    INR 1.9 (*)    All other components within normal limits  CBC  D-DIMER, QUANTITATIVE  TROPONIN I (HIGH SENSITIVITY)  TROPONIN I (HIGH SENSITIVITY)    EKG None  Radiology DG Chest Portable 1 View  Result Date: 09/19/2021 CLINICAL DATA:  Chest pain EXAM: PORTABLE CHEST 1 VIEW COMPARISON:  09/02/2021 FINDINGS: Low lung volumes. No visible consolidation, pleural effusions, or pneumothorax. Enlarged cardiac silhouette, similar. Median sternotomy and left atrial appendage occlusion device. IMPRESSION: 1. Low lung volumes without evidence of acute cardiopulmonary disease. 2. Cardiomegaly. Electronically Signed   By: Margaretha Sheffield M.D.   On: 09/19/2021 10:15    Procedures Procedures    Medications Ordered in ED Medications  acetaminophen (TYLENOL) tablet 650 mg (650 mg Oral Given 09/19/21 1045)  potassium chloride SA (KLOR-CON M) CR tablet 40 mEq (40 mEq Oral Given 09/19/21 1045)    ED Course/ Medical Decision Making/ A&P                           Medical Decision Making Amount and/or Complexity of Data Reviewed External Data Reviewed: notes.    Details: Chart review shows primary care office visit January 2023 for sinusitis. Labs: ordered. Decision-making details documented in ED Course. Radiology: ordered. ECG/medicine tests: ordered and independent interpretation performed.  Risk OTC drugs. Prescription drug  management. Risk Details: Initial troponin is negative.  Patient's been having symptoms for greater than 2 days with negative troponin and unchanged EKG.  D-dimer ordered given she has a history of COVID with shortness of breath and chest pain now.  D-dimer is negative and troponins are negative x2.  I doubt acute coronary syndrome given work-up today.  I do not feel the patient needs admission given negative work-up and normal vital signs.  Will recommend outpatient follow-up with her doctor within the week.  Recommending immediate return for worsening symptoms worsening pain trouble breathing or any additional concerns.           Final Clinical Impression(s) / ED Diagnoses Final diagnoses:  Chest pain, unspecified type    Rx / DC Orders ED Discharge Orders     None         Luna Fuse, MD 09/19/21 1246

## 2021-09-23 ENCOUNTER — Emergency Department (HOSPITAL_BASED_OUTPATIENT_CLINIC_OR_DEPARTMENT_OTHER): Payer: 59

## 2021-09-23 ENCOUNTER — Encounter (HOSPITAL_BASED_OUTPATIENT_CLINIC_OR_DEPARTMENT_OTHER): Payer: Self-pay | Admitting: *Deleted

## 2021-09-23 ENCOUNTER — Telehealth (HOSPITAL_BASED_OUTPATIENT_CLINIC_OR_DEPARTMENT_OTHER): Payer: Self-pay | Admitting: Cardiology

## 2021-09-23 ENCOUNTER — Emergency Department (HOSPITAL_BASED_OUTPATIENT_CLINIC_OR_DEPARTMENT_OTHER)
Admission: EM | Admit: 2021-09-23 | Discharge: 2021-09-23 | Disposition: A | Discharge status: Home / Self Care | Payer: 59 | Attending: Emergency Medicine | Admitting: Emergency Medicine

## 2021-09-23 ENCOUNTER — Emergency Department (HOSPITAL_BASED_OUTPATIENT_CLINIC_OR_DEPARTMENT_OTHER): Payer: 59 | Admitting: Radiology

## 2021-09-23 ENCOUNTER — Other Ambulatory Visit: Payer: Self-pay

## 2021-09-23 DIAGNOSIS — Z8616 Personal history of COVID-19: Secondary | ICD-10-CM | POA: Diagnosis not present

## 2021-09-23 DIAGNOSIS — R079 Chest pain, unspecified: Secondary | ICD-10-CM | POA: Diagnosis present

## 2021-09-23 DIAGNOSIS — I110 Hypertensive heart disease with heart failure: Secondary | ICD-10-CM | POA: Insufficient documentation

## 2021-09-23 DIAGNOSIS — R109 Unspecified abdominal pain: Secondary | ICD-10-CM | POA: Insufficient documentation

## 2021-09-23 DIAGNOSIS — I509 Heart failure, unspecified: Secondary | ICD-10-CM | POA: Diagnosis not present

## 2021-09-23 DIAGNOSIS — R0602 Shortness of breath: Secondary | ICD-10-CM | POA: Diagnosis not present

## 2021-09-23 DIAGNOSIS — J449 Chronic obstructive pulmonary disease, unspecified: Secondary | ICD-10-CM | POA: Insufficient documentation

## 2021-09-23 DIAGNOSIS — E119 Type 2 diabetes mellitus without complications: Secondary | ICD-10-CM | POA: Insufficient documentation

## 2021-09-23 DIAGNOSIS — Z79899 Other long term (current) drug therapy: Secondary | ICD-10-CM | POA: Insufficient documentation

## 2021-09-23 DIAGNOSIS — Z7901 Long term (current) use of anticoagulants: Secondary | ICD-10-CM | POA: Diagnosis not present

## 2021-09-23 DIAGNOSIS — Z794 Long term (current) use of insulin: Secondary | ICD-10-CM | POA: Diagnosis not present

## 2021-09-23 DIAGNOSIS — I4891 Unspecified atrial fibrillation: Secondary | ICD-10-CM | POA: Insufficient documentation

## 2021-09-23 LAB — BASIC METABOLIC PANEL
Anion gap: 11 (ref 5–15)
BUN: 29 mg/dL — ABNORMAL HIGH (ref 8–23)
CO2: 32 mmol/L (ref 22–32)
Calcium: 9.5 mg/dL (ref 8.9–10.3)
Chloride: 95 mmol/L — ABNORMAL LOW (ref 98–111)
Creatinine, Ser: 2.48 mg/dL — ABNORMAL HIGH (ref 0.44–1.00)
GFR, Estimated: 21 mL/min — ABNORMAL LOW (ref >60)
Glucose, Bld: 252 mg/dL — ABNORMAL HIGH (ref 70–99)
Potassium: 3.6 mmol/L (ref 3.5–5.1)
Sodium: 138 mmol/L (ref 135–145)

## 2021-09-23 LAB — CBC WITH DIFFERENTIAL/PLATELET
Abs Immature Granulocytes: 0.02 10*3/uL (ref 0.00–0.07)
Basophils Absolute: 0 10*3/uL (ref 0.0–0.1)
Basophils Relative: 0 %
Eosinophils Absolute: 0.3 10*3/uL (ref 0.0–0.5)
Eosinophils Relative: 6 %
HCT: 38.3 % (ref 36.0–46.0)
Hemoglobin: 12.5 g/dL (ref 12.0–15.0)
Immature Granulocytes: 0 %
Lymphocytes Relative: 22 %
Lymphs Abs: 1.4 10*3/uL (ref 0.7–4.0)
MCH: 28.3 pg (ref 26.0–34.0)
MCHC: 32.6 g/dL (ref 30.0–36.0)
MCV: 86.7 fL (ref 80.0–100.0)
Monocytes Absolute: 0.8 10*3/uL (ref 0.1–1.0)
Monocytes Relative: 13 %
Neutro Abs: 3.7 10*3/uL (ref 1.7–7.7)
Neutrophils Relative %: 59 %
Platelets: 142 10*3/uL — ABNORMAL LOW (ref 150–400)
RBC: 4.42 MIL/uL (ref 3.87–5.11)
RDW: 13.6 % (ref 11.5–15.5)
WBC: 6.2 10*3/uL (ref 4.0–10.5)
nRBC: 0 % (ref 0.0–0.2)

## 2021-09-23 LAB — URINALYSIS, ROUTINE W REFLEX MICROSCOPIC
Bilirubin Urine: NEGATIVE
Glucose, UA: NEGATIVE mg/dL
Ketones, ur: NEGATIVE mg/dL
Leukocytes,Ua: NEGATIVE
Nitrite: NEGATIVE
Protein, ur: 30 mg/dL — AB
Specific Gravity, Urine: 1.018 (ref 1.005–1.030)
pH: 5 (ref 5.0–8.0)

## 2021-09-23 LAB — LIPASE, BLOOD: Lipase: <10 U/L — ABNORMAL LOW (ref 11–51)

## 2021-09-23 LAB — CBG MONITORING, ED: Glucose-Capillary: 223 mg/dL — ABNORMAL HIGH (ref 70–99)

## 2021-09-23 LAB — HEPATIC FUNCTION PANEL
ALT: 25 U/L (ref 0–44)
AST: 22 U/L (ref 15–41)
Albumin: 3.5 g/dL (ref 3.5–5.0)
Alkaline Phosphatase: 115 U/L (ref 38–126)
Bilirubin, Direct: <0.1 mg/dL (ref 0.0–0.2)
Total Bilirubin: 0.5 mg/dL (ref 0.3–1.2)
Total Protein: 7.6 g/dL (ref 6.5–8.1)

## 2021-09-23 LAB — BRAIN NATRIURETIC PEPTIDE: B Natriuretic Peptide: 31.1 pg/mL (ref 0.0–100.0)

## 2021-09-23 LAB — TROPONIN I (HIGH SENSITIVITY)
Troponin I (High Sensitivity): 4 ng/L (ref <18)
Troponin I (High Sensitivity): 4 ng/L (ref <18)

## 2021-09-23 MED ORDER — HYDROMORPHONE HCL 1 MG/ML IJ SOLN
1.0000 mg | Freq: Once | INTRAMUSCULAR | Status: AC
  Administered 2021-09-23: 1 mg via INTRAVENOUS
  Filled 2021-09-23: qty 1

## 2021-09-23 MED ORDER — OXYCODONE HCL 5 MG PO TABS: 5.0000 mg | ORAL_TABLET | ORAL | 0 refills | Status: DC | PRN

## 2021-09-23 MED ORDER — IPRATROPIUM-ALBUTEROL 0.5-2.5 (3) MG/3ML IN SOLN
3.0000 mL | Freq: Once | RESPIRATORY_TRACT | Status: AC
  Administered 2021-09-23: 3 mL via RESPIRATORY_TRACT
  Filled 2021-09-23: qty 3

## 2021-09-23 MED ORDER — ONDANSETRON HCL 4 MG/2ML IJ SOLN
4.0000 mg | Freq: Once | INTRAMUSCULAR | Status: AC
  Administered 2021-09-23: 4 mg via INTRAVENOUS

## 2021-09-23 NOTE — ED Notes (Signed)
Patient transported to CT 

## 2021-09-23 NOTE — Telephone Encounter (Signed)
Returned patients call regarding chest pain telephone encounter  D/c'd from hospital on 1/14 from covid. Patient is currently having Chest pain, throwing up and fever with no appetite    Patient was advised to report to the Emergency room and given locations! Patient states she is going to come to the [facility] ED

## 2021-09-23 NOTE — ED Notes (Signed)
Pt given juice and crackers, tolerating well.

## 2021-09-23 NOTE — ED Notes (Signed)
Asked for urine sample x 2, pt reports cannot go at this time

## 2021-09-23 NOTE — ED Notes (Signed)
Pt's CBG result was 223. Informed Jess - RN.

## 2021-09-23 NOTE — Telephone Encounter (Signed)
Pt c/o of Chest Pain: STAT if CP now or developed within 24 hours  1. Are you having CP right now? Yes   2. Are you experiencing any other symptoms (ex. SOB, nausea, vomiting, sweating)? SOB, Nausea   3. How long have you been experiencing CP? Last week (went to ER on 30th but things have gotten worse)  4. Is your CP continuous or coming and going? continuous  5. Have you taken Nitroglycerin? No    Sister ended up saying she going have her go back to ER. Also she wasn't with the patient at the moment  ?

## 2021-09-23 NOTE — Discharge Instructions (Signed)
If you develop recurrent, continued, or worsening chest pain, shortness of breath, fever, vomiting, abdominal or back pain, or any other new/concerning symptoms then return to the ER for evaluation.  

## 2021-09-23 NOTE — ED Notes (Signed)
Patient transported to X-ray 

## 2021-09-23 NOTE — ED Provider Notes (Signed)
[facility] DE[facility] Provider Note   CSN: [id number] Arrival date & time: 09/23/21  1506     History  Chief Complaint  Patient presents with   Chest Pain    [name] is a 66 y.o. female.  HPI 66 year old female with numerous comorbidities including diastolic CHF, COPD, diabetes, hypertension, obesity, atrial fibrillation, presents with a chief complaint of chest pain.  She has numerous complaints on top of this, and states this all started when she got out of the hospital for [place] around 1/14.  She states she has been having chest pain and shortness of breath ever since.  Feels like a pressure in her inferior middle chest.  Sometimes it radiates to her arm.  She is also been having a cough that has improved since her COVID went away but not gone.  She reports a fever up to 100.5 over the last 3 days or so.  She was seen here on 1/30 and discharged home to take Tylenol.  She states that not helping.  She endorses worsening leg swelling over the last week bilaterally in addition to vomiting, diarrhea and severe chest pain.  Home Medications Prior to Admission medications   Medication Sig Start Date End Date Taking? Authorizing Provider  oxyCODONE (ROXICODONE) 5 MG immediate release tablet Take 1 tablet (5 mg total) by mouth every 4 (four) hours as needed for severe pain. 09/23/21  Yes Sherwood Gambler, MD  acetaminophen (TYLENOL) 500 MG tablet Take 1,000 mg by mouth every 6 (six) hours as needed for mild pain.    [provider]  Alcohol Swabs (B-D SINGLE USE SWABS REGULAR) PADS 1 each by Other route daily. 07/06/21   [name], NP  Alcohol Swabs PADS 1 application by Does not apply route in the morning, at noon, and at bedtime. 09/14/21   [name], NP  amiodarone (PACERONE) 200 MG tablet Take 1 tablet (200 mg total) by mouth 2 (two) times daily. 09/02/21   Aline August, MD  apixaban (ELIQUIS) 5 MG TABS tablet Take 1 tablet (5 mg  total) by mouth 2 (two) times daily. 05/16/21   Milford, Maricela Bo, FNP  benzonatate (TESSALON) 200 MG capsule Take 1 capsule (200 mg total) by mouth 2 (two) times daily as needed for cough. 09/02/21   Aline August, MD  blood glucose meter kit and supplies KIT Dispense based on patient and insurance preference. Use up to four times daily as directed. 02/10/21   Patrecia Pour, MD  blood glucose meter kit and supplies Dispense based on patient and insurance preference. Use up to four times daily as directed. (FOR ICD-10 E10.9, E11.9). 07/06/21   [name], NP  Blood Glucose Monitoring Suppl (ACCU-CHEK GUIDE ME) w/Device KIT Use as instructed to check blood sugar three times daily. E11.69 04/21/20   Charlott Rakes, MD  busPIRone (BUSPAR) 5 MG tablet Take 1 tablet (5 mg total) by mouth 2 (two) times daily. 09/14/21   [name], NP  colchicine 0.6 MG tablet Take 0.3 mg by mouth daily.    [provider]  docusate sodium (COLACE) 100 MG capsule Take 100 mg by mouth 2 (two) times daily.    [provider]  DULoxetine (CYMBALTA) 60 MG capsule Take 1 capsule (60 mg total) by mouth daily. 09/14/21   [name], NP  EPINEPHrine 0.3 mg/0.3 mL IJ SOAJ injection Inject 0.3 mg into the muscle as needed for anaphylaxis. 06/25/20   [name],  NP  fluticasone (FLONASE) 50 MCG/ACT nasal spray Place 2 sprays into both nostrils daily. 05/24/21   [name], NP  glucose blood (ACCU-CHEK GUIDE) test strip Use to check blood sugar TID. E11.69 07/13/21   Charlott Rakes, MD  guaiFENesin-dextromethorphan (ROBITUSSIN DM) 100-10 MG/5ML syrup Take 10 mLs by mouth every 4 (four) hours as needed for cough. 09/02/21   Aline August, MD  insulin aspart (NOVOLOG) 100 UNIT/ML injection Inject 2-10 Units into the skin 3 (three) times daily before meals. Per sliding scale 09/02/21   Aline August, MD  loratadine (CLARITIN) 10 MG tablet Take 1 tablet (10 mg total) by mouth  daily. Patient not taking: Reported on 09/15/2021 09/14/21   [name], NP  metolazone (ZAROXOLYN) 2.5 MG tablet Take 1 tablet (2.5 mg total) by mouth every Monday, Wednesday, and Friday. 09/07/21   Aline August, MD  metoprolol succinate (TOPROL XL) 25 MG 24 hr tablet Take 1.5 tablets (37.5 mg total) by mouth daily. 09/09/21 09/09/22  Bensimhon, Shaune Pascal, MD  Multiple Vitamins-Minerals (MULTIVITAMIN WOMEN PO) Take 1 tablet by mouth daily.    [provider]  nitroGLYCERIN (NITROSTAT) 0.4 MG SL tablet Place 1 tablet (0.4 mg total) under the tongue every 5 (five) minutes x 3 doses as needed for chest pain. 01/08/14   Charolette Forward, MD  pantoprazole (PROTONIX) 40 MG tablet Take 1 tablet (40 mg total) by mouth daily. 09/14/21   [name], NP  potassium chloride SA (KLOR-CON M) 20 MEQ tablet Take 2 tablets (40 mEq total) by mouth daily. Take an additional 20 meq with every dose of METOLAZONE 09/02/21   Aline August, MD  RESTASIS 0.05 % ophthalmic emulsion Place 1 drop into both eyes 2 (two) times daily. 11/19/17   [provider]  rosuvastatin (CRESTOR) 10 MG tablet Take 1 tablet (10 mg total) by mouth every evening. 09/02/21   Aline August, MD  torsemide (DEMADEX) 20 MG tablet Take 2 tablets (40 mg total) by mouth 2 (two) times daily. 09/08/21   Bensimhon, Shaune Pascal, MD  TRULICITY 1.5 XB/2.8UX SOPN INJECT 1.5 MG INTO THE SKIN ONCE A WEEK. 08/19/21 11/17/21  [name], NP      Allergies    Bee pollen and Sulfa antibiotics    Review of Systems   Review of Systems  Constitutional:  Positive for fever.  Respiratory:  Positive for cough and shortness of breath.   Cardiovascular:  Positive for chest pain and leg swelling.  Gastrointestinal:  Positive for abdominal pain, diarrhea and vomiting.   Physical Exam Updated Vital Signs BP (!) 141/74 (BP Location: Left Arm)    Pulse 93    Temp 97.9 F (36.6 C)    Resp 14    Wt 114.3 kg    SpO2 100%    BMI 39.47 kg/m   Physical Exam Vitals and nursing note reviewed.  Constitutional:      Appearance: She is well-developed. She is not ill-appearing or diaphoretic.  HENT:     Head: Normocephalic and atraumatic.  Cardiovascular:     Rate and Rhythm: Rhythm irregular.     Heart sounds: Normal heart sounds.     Comments: Frequent PVCs on monitor, HR ~90s Pulmonary:     Effort: Pulmonary effort is normal.     Breath sounds: Normal breath sounds.  Chest:     Chest wall: Tenderness (mid-sternal) present.  Abdominal:     Palpations: Abdomen is soft.     Tenderness: There is abdominal  tenderness in the epigastric area.  Musculoskeletal:     Right lower leg: Edema present.     Left lower leg: Edema present.     Comments: Mild lower leg swelling, no pitting  Skin:    General: Skin is warm and dry.  Neurological:     Mental Status: She is alert.    ED Results / Procedures / Treatments   Labs (all labs ordered are listed, but only abnormal results are displayed) Labs Reviewed  BASIC METABOLIC PANEL - Abnormal; Notable for the following components:      Result Value   Chloride 95 (*)    Glucose, Bld 252 (*)    BUN 29 (*)    Creatinine, Ser 2.48 (*)    GFR, Estimated 21 (*)    All other components within normal limits  LIPASE, BLOOD - Abnormal; Notable for the following components:   Lipase <10 (*)    All other components within normal limits  CBC WITH DIFFERENTIAL/PLATELET - Abnormal; Notable for the following components:   Platelets 142 (*)    All other components within normal limits  URINALYSIS, ROUTINE W REFLEX MICROSCOPIC - Abnormal; Notable for the following components:   APPearance HAZY (*)    Hgb urine dipstick SMALL (*)    Protein, ur 30 (*)    Bacteria, UA RARE (*)    All other components within normal limits  CBG MONITORING, ED - Abnormal; Notable for the following components:   Glucose-Capillary 223 (*)    All other components within normal limits  BRAIN NATRIURETIC PEPTIDE   HEPATIC FUNCTION PANEL  TROPONIN I (HIGH SENSITIVITY)  TROPONIN I (HIGH SENSITIVITY)    EKG EKG Interpretation  Date/Time:  Friday September 23 2021 15:46:41 EST Ventricular Rate:  92 PR Interval:  109 QRS Duration: 122 QT Interval:  327 QTC Calculation: 405 R Axis:   129 Text Interpretation: Sinus rhythm Ventricular trigeminy Short PR interval RBBB and LPFB nonspecific inferior ST changes similar to Jan 2023 Confirmed by Sherwood Gambler [phone]) on 09/23/2021 3:56:16 PM  Radiology CT ABDOMEN PELVIS WO CONTRAST  Result Date: 09/23/2021 CLINICAL DATA:  Abdominal pain, shortness of breath, chest pain. History of hysterectomy and cholecystectomy. EXAM: CT ABDOMEN AND PELVIS WITHOUT CONTRAST TECHNIQUE: Multidetector CT imaging of the abdomen and pelvis was performed following the standard protocol without IV contrast. RADIATION DOSE REDUCTION: This exam was performed according to the departmental dose-optimization program which includes automated exposure control, adjustment of the mA and/or kV according to patient size and/or use of iterative reconstruction technique. COMPARISON:  CT abdomen dated 05/05/2021 FINDINGS: Lower chest: Patchy atelectasis at the bilateral lung bases. No acute findings. Low-density foci within RIGHT lower lobe pulmonary artery branches, likely artifactual but could indicate pulmonary embolism in the setting of chest pain and shortness of breath. Hepatobiliary: No focal liver abnormality is seen. Status post cholecystectomy. No bile duct dilatation. Pancreas: Unremarkable. No pancreatic ductal dilatation or surrounding inflammatory changes. Spleen: Normal in size without focal abnormality. Adrenals/Urinary Tract: Adrenal glands are unremarkable. Kidneys are unremarkable without stone or hydronephrosis. No perinephric fluid. 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 No ureteral or bladder calculi. Bladder is unremarkable, partially  decompressed. Stomach/Bowel: No dilated large or small bowel loops. No evidence of bowel wall inflammation. Appendix is normal. Stomach is unremarkable. Vascular/Lymphatic: Mild aortic atherosclerosis. No abdominal aortic aneurysm. No enlarged lymph nodes are seen. Reproductive: Status post hysterectomy. No adnexal mass or free fluid. Other: No free fluid or  abscess collection. No free intraperitoneal air. Musculoskeletal: Degenerative spondylosis of the lumbar spine, mild in degree. No acute-appearing osseous abnormality. IMPRESSION: 1. Low-density foci within RIGHT lower lobe pulmonary artery branches, likely artifactual but could indicate pulmonary embolism in the setting of chest pain and shortness of breath. Consider further characterization with chest CT angiogram (PE protocol). 2. No acute findings within the abdomen or pelvis. No bowel obstruction or evidence of bowel wall inflammation. No evidence of acute solid organ abnormality. No renal or ureteral calculi. Appendix is normal. Aortic Atherosclerosis (ICD10-I70.0). Electronically Signed   By: Franki Cabot M.D.   On: 09/23/2021 20:36   DG Chest 2 View  Result Date: 09/23/2021 CLINICAL DATA:  Chest pain and shortness of breath EXAM: CHEST - 2 VIEW COMPARISON:  Radiograph 09/19/2021 FINDINGS: Prior median sternotomy. Mitral annuloplasty. Unchanged appendage clip. Unchanged cardiomegaly. No focal airspace disease. No pleural effusion. No pneumothorax. No acute osseous abnormality. Bilateral shoulder degenerative changes. Thoracic spondylosis. IMPRESSION: No focal airspace disease.  Unchanged cardiomegaly. Electronically Signed   By: Maurine Simmering M.D.   On: 09/23/2021 16:21    Procedures Ultrasound ED Peripheral IV (Provider)  Date/Time: 09/23/2021 4:34 PM Performed by: Sherwood Gambler, MD Authorized by: Sherwood Gambler, MD   Procedure details:    Indications: multiple failed IV attempts and poor IV access     Skin Prep: chlorhexidine gluconate      Location:  Right AC   Angiocath:  20 G   Bedside Ultrasound Guided: Yes     Patient tolerated procedure without complications: Yes     Dressing applied: Yes      Medications Ordered in ED Medications  HYDROmorphone (DILAUDID) injection 1 mg (1 mg Intravenous Given 09/23/21 1634)  ondansetron (ZOFRAN) injection 4 mg (4 mg Intravenous Given 09/23/21 1722)  ipratropium-albuterol (DUONEB) 0.5-2.5 (3) MG/3ML nebulizer solution 3 mL (3 mLs Nebulization Given 09/23/21 2054)    ED Course/ Medical Decision Making/ A&P                           Medical Decision Making Amount and/or Complexity of Data Reviewed Labs: ordered. Radiology: ordered.  Risk Prescription drug management.   Patient has numerous complaints.  I do not see any obvious reason to explain all of them or any medical emergencies.  Her ECG shows some nonspecific findings but this seems similar and with troponins negative x2, I think ACS is pretty unlikely.  I doubt dissection.  PE initially considered though on chart review she just had a negative D-dimer a few days ago and she is taking Eliquis and is compliant.  However given her continued symptoms despite an initial negative work-up, including negative checks x-ray (I have personally reviewed these images and find no obvious pneumonia or emergency) in addition to her other labs which do not so evidence of pancreatitis, biliary obstruction, etc. a CT was obtained of her abdomen/pelvis given the upper abdominal discomfort.  This is fairly unremarkable though I did discuss with the radiologist, Dr. [name], who indicates that she has some abnormal right lower lobe findings that could possibly be from PE but is very nonspecific.  Patient cannot get a CTA because of her renal dysfunction, which is chronic and stable.  However had a discussion with patient and she would not want to go get a VQ scan at [facility] at this point (this is not available here).  I also think that given the negative  D-dimer recently, normal oxygen  saturation, and compliance with Eliquis, PE or significant PE is pretty unlikely.  She is okay with holding off on this.  We will give a short course of pain control but at this point she appears stable       Final Clinical Impression(s) / ED Diagnoses Final diagnoses:  Nonspecific chest pain    Rx / DC Orders ED Discharge Orders          Ordered    oxyCODONE (ROXICODONE) 5 MG immediate release tablet  Every 4 hours PRN        09/23/21 2223              Sherwood Gambler, MD 09/24/21 [phone]

## 2021-09-23 NOTE — ED Triage Notes (Signed)
Pt is here for shortness of breath and chest pain which feels like a tightness.

## 2021-09-26 ENCOUNTER — Telehealth (HOSPITAL_BASED_OUTPATIENT_CLINIC_OR_DEPARTMENT_OTHER): Payer: Self-pay | Admitting: Cardiology

## 2021-09-26 NOTE — Telephone Encounter (Signed)
Patient states she was at the Urgent Care Friday 09/23/21. She had a CT done but was told that another test needed to be done at the [facility] to see if she had a blood clot.  She wanted to know what Dr.Christopher recommends

## 2021-09-26 NOTE — Telephone Encounter (Signed)
Pt in [facility] ED 09/23/21 with numerous complaints. Per ED notes: negative d-dimer few days prior. She had CT abdomen/pelvis given upper abdominal discomfort. Abnormal RLL findings from possible PE though very unspecific. No CTA due to renal dysfunction. She declined VQ scan (would require transfer to [facility]) and given negative d-dimer a few days prior, normal O2, compliance with Eliquis - PE thought to be unlikely. I have copied full CT report below.   She has an upcoming follow up appointment 10/03/21 with Dr. Harrell Gave. Anticipate concerns could be discussed at her upcoming office visit.   Will route to Dr. Harrell Gave and her team for input.   Loel Dubonnet, NP  ______________________  CT ABD/Pelvis 09/23/21  IMPRESSION: 1. Low-density foci within RIGHT lower lobe pulmonary artery branches, likely artifactual but could indicate pulmonary embolism in the setting of chest pain and shortness of breath. Consider further characterization with chest CT angiogram (PE protocol). 2. No acute findings within the abdomen or pelvis. No bowel obstruction or evidence of bowel wall inflammation. No evidence of acute solid organ abnormality. No renal or ureteral calculi. Appendix is normal.

## 2021-09-26 NOTE — Telephone Encounter (Signed)
Thanks, agree with negative d dimer and chronic anticoagulation that suspicion for PE is low. We can discuss at her visit.

## 2021-09-27 ENCOUNTER — Encounter: Payer: Self-pay | Admitting: Cardiology

## 2021-09-27 ENCOUNTER — Telehealth: Payer: Self-pay | Admitting: *Deleted

## 2021-09-27 ENCOUNTER — Other Ambulatory Visit: Payer: Self-pay

## 2021-09-27 ENCOUNTER — Ambulatory Visit (INDEPENDENT_AMBULATORY_CARE_PROVIDER_SITE_OTHER): Payer: 59 | Admitting: Cardiology

## 2021-09-27 VITALS — BP 98/70 | HR 92 | Ht 67.0 in | Wt 268.0 lb

## 2021-09-27 DIAGNOSIS — I5042 Chronic combined systolic (congestive) and diastolic (congestive) heart failure: Secondary | ICD-10-CM | POA: Diagnosis not present

## 2021-09-27 DIAGNOSIS — I4892 Unspecified atrial flutter: Secondary | ICD-10-CM

## 2021-09-27 DIAGNOSIS — I4891 Unspecified atrial fibrillation: Secondary | ICD-10-CM

## 2021-09-27 MED ORDER — AMIODARONE HCL 200 MG PO TABS: 200.0000 mg | ORAL_TABLET | Freq: Every day | ORAL | 3 refills | Status: DC

## 2021-09-27 NOTE — Progress Notes (Deleted)
Recent COVID and lingering chest pain  I last saw her July 27, 2021.  Previously seen by Dr. Rayann Heman.  She had multiple cardioversions.  Previously failed Tikosyn.  Had a Maze procedure in May 2022 at the time of mitral valve surgery.  She is on amiodarone.  Previously discussed AV junction ablation plus pacemaker implant.  Hospitalized January 6 through January 15 with chest pain and shortness of breath.  She was found to be in atrial fibrillation with RVR.  Incidental COVID infection discovered.  July 29, 2021 echo shows left ventricular function is mildly decreased, 40%  Right heart cath July 29, 2021 shows normal filling pressures and mild pulmonary arterial hypertension.  Her cardiac index was 1.9.   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  Staged.

## 2021-09-27 NOTE — Progress Notes (Signed)
Electrophysiology Office Follow up Visit Note:    Date:  09/27/2021   ID:  [name], DOB 26-May-1956, MRN [id number]  PCP:  Leslie Perna, NP  [facility] Cardiologist:  Buford Dresser, MD  [facility] Electrophysiologist:  Vickie Epley, MD    Interval History:    [name] is a 66 y.o. female who presents for a follow up visit. They were last seen in clinic 07/27/2021.  Recent COVID and lingering chest pain  I last saw her July 27, 2021.  Previously seen by Dr. Rayann Heman.  She had multiple cardioversions.  Previously failed Tikosyn.  Had a Maze procedure in May 2022 at the time of mitral valve surgery.  She is on amiodarone.    Hospitalized January 6 through January 15 with chest pain and shortness of breath.  She was found to be in atrial fibrillation with RVR.  Incidental COVID infection discovered.  July 29, 2021 echo shows left ventricular function is mildly decreased, 40%  Right heart cath July 29, 2021 shows normal filling pressures and mild pulmonary arterial hypertension.  Her cardiac index was 1.9.  Since her last appointment, she continues to suffer from chest pains and a recent COVID infection. Overall she feels like she has no strength or energy.   Lately she also has a significant loss of appetite. She still has some sense of taste and smell.  She denies any palpitations, or shortness of breath. No lightheadedness, headaches, syncope, orthopnea, PND, or lower extremity edema.       Past Medical History:  Diagnosis Date   Allergic rhinitis    Arthritis    Asthma    Chest pain 12/27/2017   Chronic diastolic CHF (congestive heart failure) (HCC)    COPD (chronic obstructive pulmonary disease) (HCC)    Depression    DM (diabetes mellitus) ([place])    DVT (deep vein thrombosis) in pregnancy    Dysrhythmia    atrial fibrilation   GERD (gastroesophageal reflux disease)    Headache(784.0)    HTN (hypertension)     Hyperlipidemia    Obesity    Pneumonia 04/2018   RIGHT LOBE   Shortness of breath    Sleep apnea    compliant with CPAP    Past Surgical History:  Procedure Laterality Date   ABDOMINAL HYSTERECTOMY     BUBBLE STUDY  11/26/2020   Procedure: BUBBLE STUDY;  Surgeon: Buford Dresser, MD;  Location: [place];  Service: Cardiovascular;;   CARDIOVERSION N/A 05/06/2020   Procedure: CARDIOVERSION;  Surgeon: Buford Dresser, MD;  Location: [facility] ENDOSCOPY;  Service: Cardiovascular;  Laterality: N/A;   CARDIOVERSION N/A 11/04/2020   Procedure: CARDIOVERSION;  Surgeon: Lelon Perla, MD;  Location: [place];  Service: Cardiovascular;  Laterality: N/A;   CARDIOVERSION N/A 02/01/2021   Procedure: CARDIOVERSION;  Surgeon: Jolaine Artist, MD;  Location: [place];  Service: Cardiovascular;  Laterality: N/A;   CARDIOVERSION N/A 02/09/2021   Procedure: CARDIOVERSION;  Surgeon: Jolaine Artist, MD;  Location: [place];  Service: Cardiovascular;  Laterality: N/A;   CARDIOVERSION N/A 04/19/2021   Procedure: CARDIOVERSION;  Surgeon: Fay Records, MD;  Location: [place];  Service: Cardiovascular;  Laterality: N/A;   CATARACT EXTRACTION, BILATERAL     CHOLECYSTECTOMY     COLONOSCOPY WITH PROPOFOL N/A 03/05/2015   Procedure: COLONOSCOPY WITH PROPOFOL;  Surgeon: Carol Ada, MD;  Location: [facility] ENDOSCOPY;  Service: Endoscopy;  Laterality: N/A;   DIAGNOSTIC LAPAROSCOPY     ingrown hallux Left  KNEE SURGERY     LEFT HEART CATH AND CORONARY ANGIOGRAPHY N/A 12/31/2017   Procedure: LEFT HEART CATH AND CORONARY ANGIOGRAPHY;  Surgeon: Charolette Forward, MD;  Location: [place] CV LAB;  Service: Cardiovascular;  Laterality: N/A;   MAZE N/A 12/29/2020   Procedure: MAZE;  Surgeon: [name], MD;  Location: [place];  Service: Open Heart Surgery;  Laterality: N/A;   MITRAL VALVE REPAIR N/A 12/29/2020   Procedure: MITRAL VALVE REPAIR USING CARBOMEDICS ANNULOFLEX RING SIZE  30;  Surgeon: [name], MD;  Location: [place];  Service: Open Heart Surgery;  Laterality: N/A;   RIGHT HEART CATH N/A 12/22/2020   Procedure: RIGHT HEART CATH;  Surgeon: Larey Dresser, MD;  Location: [place] CV LAB;  Service: Cardiovascular;  Laterality: N/A;   RIGHT HEART CATH N/A 01/31/2021   Procedure: RIGHT HEART CATH;  Surgeon: Jolaine Artist, MD;  Location: [place] CV LAB;  Service: Cardiovascular;  Laterality: N/A;   RIGHT HEART CATH N/A 07/29/2021   Procedure: RIGHT HEART CATH;  Surgeon: Jolaine Artist, MD;  Location: [place] CV LAB;  Service: Cardiovascular;  Laterality: N/A;   TEE WITHOUT CARDIOVERSION N/A 11/26/2020   Procedure: TRANSESOPHAGEAL ECHOCARDIOGRAM (TEE);  Surgeon: Buford Dresser, MD;  Location: [facility] ENDOSCOPY;  Service: Cardiovascular;  Laterality: N/A;   TEE WITHOUT CARDIOVERSION N/A 12/29/2020   Procedure: TRANSESOPHAGEAL ECHOCARDIOGRAM (TEE);  Surgeon: [name], MD;  Location: [place];  Service: Open Heart Surgery;  Laterality: N/A;   TRICUSPID VALVE REPLACEMENT N/A 12/29/2020   Procedure: 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 RING SIZE 34;  Surgeon: [name], MD;  Location: [place];  Service: Open Heart Surgery;  Laterality: N/A;    Current Medications: Current Meds  Medication Sig   acetaminophen (TYLENOL) 500 MG tablet Take 1,000 mg by mouth every 6 (six) hours as needed for mild pain.   Alcohol Swabs (B-D SINGLE USE SWABS REGULAR) PADS 1 each by Other route daily.   Alcohol Swabs PADS 1 application by Does not apply route in the morning, at noon, and at bedtime.   amiodarone (PACERONE) 200 MG tablet Take 1 tablet (200 mg total) by mouth 2 (two) times daily.   apixaban (ELIQUIS) 5 MG TABS tablet Take 1 tablet (5 mg total) by mouth 2 (two) times daily.   benzonatate (TESSALON) 200 MG capsule Take 1 capsule (200 mg total) by mouth 2 (two) times daily as needed for cough.   blood glucose meter kit  and supplies KIT Dispense based on patient and insurance preference. Use up to four times daily as directed.   blood glucose meter kit and supplies Dispense based on patient and insurance preference. Use up to four times daily as directed. (FOR ICD-10 E10.9, E11.9).   Blood Glucose Monitoring Suppl (ACCU-CHEK GUIDE ME) w/Device KIT Use as instructed to check blood sugar three times daily. E11.69   busPIRone (BUSPAR) 5 MG tablet Take 1 tablet (5 mg total) by mouth 2 (two) times daily.   colchicine 0.6 MG tablet Take 0.3 mg by mouth daily.   docusate sodium (COLACE) 100 MG capsule Take 100 mg by mouth 2 (two) times daily.   DULoxetine (CYMBALTA) 60 MG capsule Take 1 capsule (60 mg total) by mouth daily.   EPINEPHrine 0.3 mg/0.3 mL IJ SOAJ injection Inject 0.3 mg into the muscle as needed for anaphylaxis.   fluticasone (FLONASE) 50 MCG/ACT nasal spray Place 2 sprays into both nostrils daily.   glucose blood (  ACCU-CHEK GUIDE) test strip Use to check blood sugar TID. E11.69   guaiFENesin-dextromethorphan (ROBITUSSIN DM) 100-10 MG/5ML syrup Take 10 mLs by mouth every 4 (four) hours as needed for cough.   insulin aspart (NOVOLOG) 100 UNIT/ML injection Inject 2-10 Units into the skin 3 (three) times daily before meals. Per sliding scale   loratadine (CLARITIN) 10 MG tablet Take 1 tablet (10 mg total) by mouth daily.   metolazone (ZAROXOLYN) 2.5 MG tablet Take 1 tablet (2.5 mg total) by mouth every Monday, Wednesday, and Friday.   metoprolol succinate (TOPROL XL) 25 MG 24 hr tablet Take 1.5 tablets (37.5 mg total) by mouth daily.   Multiple Vitamins-Minerals (MULTIVITAMIN WOMEN PO) Take 1 tablet by mouth daily.   nitroGLYCERIN (NITROSTAT) 0.4 MG SL tablet Place 1 tablet (0.4 mg total) under the tongue every 5 (five) minutes x 3 doses as needed for chest pain.   oxyCODONE (ROXICODONE) 5 MG immediate release tablet Take 1 tablet (5 mg total) by mouth every 4 (four) hours as needed for severe pain.    pantoprazole (PROTONIX) 40 MG tablet Take 1 tablet (40 mg total) by mouth daily.   potassium chloride SA (KLOR-CON M) 20 MEQ tablet Take 2 tablets (40 mEq total) by mouth daily. Take an additional 20 meq with every dose of METOLAZONE   RESTASIS 0.05 % ophthalmic emulsion Place 1 drop into both eyes 2 (two) times daily.   rosuvastatin (CRESTOR) 10 MG tablet Take 1 tablet (10 mg total) by mouth every evening.   torsemide (DEMADEX) 20 MG tablet Take 2 tablets (40 mg total) by mouth 2 (two) times daily.   TRULICITY 1.5 OV/2.9VB SOPN INJECT 1.5 MG INTO THE SKIN ONCE A WEEK.     Allergies:   Bee pollen and Sulfa antibiotics   Social History   Socioeconomic History   Marital status: Significant Other    Spouse name: Not on file   Number of children: 2   Years of education: Not on file   Highest education level: Some college, no degree  Occupational History   Not on file  Tobacco Use   Smoking status: Never   Smokeless tobacco: Never  Vaping Use   Vaping Use: Never used  Substance and Sexual Activity   Alcohol use: No   Drug use: No   Sexual activity: Not Currently  Other Topics Concern   Not on file  Social History Narrative   Pt lives in [place] with spouse.  2 grown children.   Previously worked in [employer] at [employer].  Now on disability   Social Determinants of Health   Financial Resource Strain: Low Risk    Difficulty of Paying Living Expenses: Not very hard  Food Insecurity: No Food Insecurity   Worried About Charity fundraiser in the Last Year: Never true   Ran Out of Food in the Last Year: Never true  Transportation Needs: No Transportation Needs   Lack of Transportation (Medical): No   Lack of Transportation (Non-Medical): No  Physical Activity: Not on file  Stress: Not on file  Social Connections: Not on file     Family History: The patient's family history includes CAD in an other family member; Cancer in her father and mother; Hypertension in her brother,  mother, and sister.  ROS:   Please see the history of present illness.    (+) Chest pain (+) Fatigue (+) Loss of appetite All other systems reviewed and are negative.  EKGs/Labs/Other Studies Reviewed:  The following studies were reviewed today:  07/29/2021 Right Heart Cath: Findings:   RA = 5 RV = 36/8 PA = 40/11 (26) PCW = 13 Fick cardiac output/index = 4.3/1.9 PVR = 3.0 WU FA sat = 95% PA sat = 52%, 55%   Assessment: Normal filling pressures with mild PAH Moderately reduced cardiac out likely due in part to low fluid status   Plan/Discussion:    Volume status normal to low. Dyspnea likely not related to HF. Would hold diuretics for now and resume when Scr improving. We will follow as outpatient.   07/29/2021 Echo:  1. Left ventricular ejection fraction, by estimation, is 40 to 45%. The  left ventricle has mildly decreased function. The left ventricle  demonstrates global hypokinesis. There is mild left ventricular  hypertrophy. Left ventricular diastolic parameters  are indeterminate.   2. Right ventricular systolic function is mildly reduced. The right  ventricular size is normal. There is normal pulmonary artery systolic  pressure.   3. Left atrial size was moderately dilated.   4. Right atrial size was mildly dilated.   5. HR 90 bpm. MV peak gradient, 14.1 mmHg. The mean mitral valve gradient  is 6.5 mmHg      . There is a 30 mm prosthetic annuloplasty ring present in the mitral  position.   6. S/p repair with an annuloplasty ring.   7. The aortic valve is normal in structure. Aortic valve regurgitation is  not visualized. No aortic stenosis is present.   8. The inferior vena cava is normal in size with greater than 50%  respiratory variability, suggesting right atrial pressure of 3 mmHg.   Conclusion(s)/Recommendation(s): Compared to prior echo 02/28/2021, PA  pressure reduced.   CT Chest 06/21/2021: FINDINGS: Cardiovascular: Cardiomegaly. Prior median  sternotomy and valve replacement. Aortic calcifications. No aneurysm.   Mediastinum/Nodes: No mediastinal, hilar, or axillary adenopathy. Trachea and esophagus are unremarkable. Thyroid unremarkable.   Lungs/Pleura: Linear scarring at the left base. No confluent airspace opacities or effusions. No pneumothorax.   Upper Abdomen: Imaging into the upper abdomen demonstrates no acute findings.   Musculoskeletal: Chest wall soft tissues are unremarkable. No acute bony abnormality.   IMPRESSION: No acute cardiopulmonary disease.   Cardiomegaly.   Aortic Atherosclerosis (ICD10-I70.0).  EKG:   09/27/2021: Sinus rhythm.  Ventricular rate 92 bpm.  QRS duration is 100 ms.  Recent Labs: 07/13/2021: TSH 2.666 09/04/2021: Magnesium 2.2 09/23/2021: ALT 25; B Natriuretic Peptide 31.1; BUN 29; Creatinine, Ser 2.48; Hemoglobin 12.5; Platelets 142; Potassium 3.6; Sodium 138  Recent Lipid Panel    Component Value Date/Time   CHOL 158 05/03/2021 0603   CHOL 140 06/24/2020 1054   TRIG 83 05/03/2021 0603   HDL 50 05/03/2021 0603   HDL 46 06/24/2020 1054   CHOLHDL 3.2 05/03/2021 0603   VLDL 17 05/03/2021 0603   LDLCALC 91 05/03/2021 0603   LDLCALC 75 06/24/2020 1054    Physical Exam:    VS:  BP 98/70    Pulse 92    Ht _0  (1.702 m)    Wt 268 lb (121.6 kg)    SpO2 96%    BMI 41.97 kg/m     Wt Readings from Last 3 Encounters:  09/27/21 268 lb (121.6 kg)  09/23/21 252 lb (114.3 kg)  09/15/21 252 lb (114.3 kg)     GEN: Obese.  Appears fatigued.  Mild distress due to general malaise. HEENT: Normal NECK: No JVD; No carotid bruits LYMPHATICS: No lymphadenopathy CARDIAC: RRR, no  murmurs, rubs, gallops RESPIRATORY:  Clear to auscultation without rales, wheezing or rhonchi  ABDOMEN: Soft, non-tender, non-distended MUSCULOSKELETAL:  No edema; No deformity  SKIN: Warm and dry NEUROLOGIC:  Alert and oriented x 3 PSYCHIATRIC:  Normal affect        ASSESSMENT:    1. Chronic combined  systolic and diastolic heart failure ([place])   2. Morbid obesity ([place])   3. Atrial flutter, unspecified type ([place])   4. Atrial fibrillation with RVR (HCC)    PLAN:    In order of problems listed above:  #Chronic month systolic diastolic heart failure NYHA class II-III.  Warm and relatively euvolemic on exam today.  Follows in the heart failure clinic.  #Atrial fibrillation flutter In rhythm today.  On Eliquis for stroke prophylaxis.  On amiodarone 200 mg by mouth twice daily.  I will plan to decrease her to 200 mg by mouth once daily today.  If recurrent atrial arrhythmias become a problem in the future, would need to consider AV junction ablation plus CRT.  Follow-up in 6 months with an APP.  She will need blood work at the 26-monthcheck.    Medication Adjustments/Labs and Tests Ordered: Current medicines are reviewed at length with the patient today.  Concerns regarding medicines are outlined above.   Orders Placed This Encounter  Procedures   EKG 12-Lead   No orders of the defined types were placed in this encounter.  I,Mathew Stumpf,acting as a sEducation administratorfor CVickie Epley MD.,have documented all relevant documentation on the behalf of CVickie Epley MD,as directed by  CVickie Epley MD while in the presence of CVickie Epley MD.  I, CVickie Epley MD, have reviewed all documentation for this visit. The documentation on 09/27/21 for the exam, diagnosis, procedures, and orders are all accurate and complete.   Signed, CLars Mage MD, F[facility]2/02/2022 9:55 AM    Electrophysiology [facility]

## 2021-09-27 NOTE — Patient Instructions (Addendum)
Medication Instructions:  Your physician recommends that you continue on your current medications as directed. Please refer to the Current Medication list given to you today. *If you need a refill on your cardiac medications before your next appointment, please call your pharmacy*  Lab Work: None. If you have labs (blood work) drawn today and your tests are completely normal, you will receive your results only by: MyChart Message (if you have MyChart) OR A paper copy in the mail If you have any lab test that is abnormal or we need to change your treatment, we will call you to review the results.  Testing/Procedures: None.  Follow-Up: At [facility], you and your health needs are our priority.  As part of our continuing mission to provide you with exceptional heart care, we have created designated Provider Care Teams.  These Care Teams include your primary Cardiologist (physician) and Advanced Practice Providers (APPs -  Physician Assistants and Nurse Practitioners) who all work together to provide you with the care you need, when you need it.  Your physician wants you to follow-up in: 6 months with one of the following Advanced Practice Providers on your designated Care Team:    Renee Ursuy, PA-C Michael "Andy" Tillery, PA-C   You will receive a reminder letter in the mail two months in advance. If you don't receive a letter, please call our office to schedule the follow-up appointment.  We recommend signing up for the patient portal called "MyChart".  Sign up information is provided on this After Visit Summary.  MyChart is used to connect with patients for Virtual Visits (Telemedicine).  Patients are able to view lab/test results, encounter notes, upcoming appointments, etc.  Non-urgent messages can be sent to your provider as well.   To learn more about what you can do with MyChart, go to [email or web address].    Any Other Special Instructions Will Be Listed Below (If  Applicable).         

## 2021-09-27 NOTE — Telephone Encounter (Signed)
Amiodarone 200 mg by mouth once daily instead of twice. Patient verbalized understanding.

## 2021-09-27 NOTE — Telephone Encounter (Signed)
Pt updated and verbalized understanding.  

## 2021-09-28 ENCOUNTER — Telehealth (HOSPITAL_COMMUNITY): Payer: Self-pay

## 2021-09-28 NOTE — Telephone Encounter (Signed)
Spoke to [place] who reports she is still having on and off chest pain. She was seen by [facility] yesterday and was told to decrease Amiodarone from BID to once daily. She reports she already made change to her pill box for the week. I offered her a paramedicine visit for today and she declined reporting she would like to wait until after her appointment on Monday. I will follow up with her on Monday afternoon. She understands to call 911 if pain persists. Call complete.
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
## 2021-10-03 ENCOUNTER — Ambulatory Visit (HOSPITAL_BASED_OUTPATIENT_CLINIC_OR_DEPARTMENT_OTHER): Payer: Medicare Other | Admitting: Cardiology

## 2021-10-05 ENCOUNTER — Other Ambulatory Visit (INDEPENDENT_AMBULATORY_CARE_PROVIDER_SITE_OTHER): Payer: Self-pay | Admitting: Primary Care

## 2021-10-05 ENCOUNTER — Telehealth (HOSPITAL_COMMUNITY): Payer: Self-pay | Admitting: Licensed Clinical Social Worker

## 2021-10-05 NOTE — Telephone Encounter (Signed)
HF Paramedicine Team Based Care Meeting  HF MD- NA  HF NP - [place] NP-C   [facility] admit within the last 30 days for heart failure? Two ED visits due to chest pain- not leading to admission  Medications concerns? Not currently- fills her own pillbox- is accurate during checks  Transportation issues ? No- boyfriend drives her  Education needs? no  SDOH -no  Eligible for discharge? Have continued to keep due to frequent ED visits- will inquire with physician regarding DC since has no current needs and sees other cardiologist frequently  [name], [facility] Desk#: [phone] Cell#: [phone]

## 2021-10-05 NOTE — Telephone Encounter (Signed)
Requested medication (s) are due for refill today:Unsure  Requested medication (s) are on the active medication list: no    Last refill:   Future visit scheduled no  Notes to clinic:Not on current med profile. Sent via Insurance risk surveyor Prescriptions  Pending Prescriptions Disp Refills   albuterol (VENTOLIN HFA) 108 (90 Base) MCG/ACT inhaler [Pharmacy Med Name: ALBUTEROL HFA (PROVENTIL) INH] 6.7 each 1    Sig: INHALE 2 PUFFS BY MOUTH EVERY 6 HOURS AS NEEDED     Pulmonology:  Beta Agonists 2 Passed - 10/05/2021 10:33 AM      Passed - Last BP in normal range    BP Readings from Last 1 Encounters:  09/27/21 98/70          Passed - Last Heart Rate in normal range    Pulse Readings from Last 1 Encounters:  09/27/21 92          Passed - Valid encounter within last 12 months    Recent Outpatient Visits           3 weeks ago Hospital discharge follow-up   [place] Juluis Mire P, NP   2 months ago Chest pain due to myocardial ischemia, unspecified ischemic chest pain type   [facility] Juluis Mire P, NP   4 months ago Gastroesophageal reflux disease without esophagitis   [place] [facility] Juluis Mire P, NP   5 months ago Erroneous encounter - disregard   [place] Juluis Mire P, NP   6 months ago Type 2 diabetes mellitus with stage 3 chronic kidney disease, with long-term current use of insulin, unspecified whether stage 3a or 3b CKD ([place])   [place] [name], NP

## 2021-10-13 ENCOUNTER — Telehealth (HOSPITAL_COMMUNITY): Payer: Self-pay

## 2021-10-13 NOTE — Telephone Encounter (Signed)
Left message for Sheresa to return my call for scheduling a home visit to prepare for discharge. I will continue to follow up.

## 2021-10-17 ENCOUNTER — Encounter (HOSPITAL_COMMUNITY): Payer: Self-pay

## 2021-10-17 ENCOUNTER — Emergency Department (HOSPITAL_COMMUNITY): Payer: 59

## 2021-10-17 ENCOUNTER — Other Ambulatory Visit: Payer: Self-pay

## 2021-10-17 ENCOUNTER — Emergency Department (HOSPITAL_COMMUNITY)
Admission: EM | Admit: 2021-10-17 | Discharge: 2021-10-17 | Disposition: A | Discharge status: Home / Self Care | Payer: 59 | Attending: Emergency Medicine | Admitting: Emergency Medicine

## 2021-10-17 DIAGNOSIS — Z7901 Long term (current) use of anticoagulants: Secondary | ICD-10-CM | POA: Diagnosis not present

## 2021-10-17 DIAGNOSIS — N184 Chronic kidney disease, stage 4 (severe): Secondary | ICD-10-CM | POA: Diagnosis not present

## 2021-10-17 DIAGNOSIS — I509 Heart failure, unspecified: Secondary | ICD-10-CM

## 2021-10-17 DIAGNOSIS — I5032 Chronic diastolic (congestive) heart failure: Secondary | ICD-10-CM | POA: Insufficient documentation

## 2021-10-17 DIAGNOSIS — R110 Nausea: Secondary | ICD-10-CM

## 2021-10-17 DIAGNOSIS — I4891 Unspecified atrial fibrillation: Secondary | ICD-10-CM | POA: Insufficient documentation

## 2021-10-17 DIAGNOSIS — J449 Chronic obstructive pulmonary disease, unspecified: Secondary | ICD-10-CM | POA: Insufficient documentation

## 2021-10-17 DIAGNOSIS — E1122 Type 2 diabetes mellitus with diabetic chronic kidney disease: Secondary | ICD-10-CM | POA: Diagnosis not present

## 2021-10-17 DIAGNOSIS — I130 Hypertensive heart and chronic kidney disease with heart failure and stage 1 through stage 4 chronic kidney disease, or unspecified chronic kidney disease: Secondary | ICD-10-CM | POA: Diagnosis not present

## 2021-10-17 DIAGNOSIS — Z20822 Contact with and (suspected) exposure to covid-19: Secondary | ICD-10-CM | POA: Diagnosis not present

## 2021-10-17 DIAGNOSIS — Z7951 Long term (current) use of inhaled steroids: Secondary | ICD-10-CM | POA: Insufficient documentation

## 2021-10-17 DIAGNOSIS — Z794 Long term (current) use of insulin: Secondary | ICD-10-CM | POA: Insufficient documentation

## 2021-10-17 DIAGNOSIS — R079 Chest pain, unspecified: Secondary | ICD-10-CM | POA: Diagnosis present

## 2021-10-17 DIAGNOSIS — R0789 Other chest pain: Secondary | ICD-10-CM

## 2021-10-17 DIAGNOSIS — D649 Anemia, unspecified: Secondary | ICD-10-CM

## 2021-10-17 DIAGNOSIS — D631 Anemia in chronic kidney disease: Secondary | ICD-10-CM | POA: Diagnosis not present

## 2021-10-17 LAB — URINALYSIS, ROUTINE W REFLEX MICROSCOPIC
Bilirubin Urine: NEGATIVE
Glucose, UA: NEGATIVE mg/dL
Ketones, ur: NEGATIVE mg/dL
Leukocytes,Ua: NEGATIVE
Nitrite: NEGATIVE
Protein, ur: NEGATIVE mg/dL
Specific Gravity, Urine: 1.015 (ref 1.005–1.030)
pH: 5 (ref 5.0–8.0)

## 2021-10-17 LAB — RESP PANEL BY RT-PCR (FLU A&B, COVID) ARPGX2
Influenza A by PCR: NEGATIVE
Influenza B by PCR: NEGATIVE
SARS Coronavirus 2 by RT PCR: NEGATIVE

## 2021-10-17 LAB — TROPONIN I (HIGH SENSITIVITY)
Troponin I (High Sensitivity): 4 ng/L (ref <18)
Troponin I (High Sensitivity): 4 ng/L (ref <18)

## 2021-10-17 LAB — CBC
HCT: 34.3 % — ABNORMAL LOW (ref 36.0–46.0)
Hemoglobin: 11.3 g/dL — ABNORMAL LOW (ref 12.0–15.0)
MCH: 29.5 pg (ref 26.0–34.0)
MCHC: 32.9 g/dL (ref 30.0–36.0)
MCV: 89.6 fL (ref 80.0–100.0)
Platelets: 219 10*3/uL (ref 150–400)
RBC: 3.83 MIL/uL — ABNORMAL LOW (ref 3.87–5.11)
RDW: 14.6 % (ref 11.5–15.5)
WBC: 6.6 10*3/uL (ref 4.0–10.5)
nRBC: 0 % (ref 0.0–0.2)

## 2021-10-17 LAB — COMPREHENSIVE METABOLIC PANEL
ALT: 30 U/L (ref 0–44)
AST: 28 U/L (ref 15–41)
Albumin: 2.7 g/dL — ABNORMAL LOW (ref 3.5–5.0)
Alkaline Phosphatase: 142 U/L — ABNORMAL HIGH (ref 38–126)
Anion gap: 10 (ref 5–15)
BUN: 19 mg/dL (ref 8–23)
CO2: 30 mmol/L (ref 22–32)
Calcium: 8.9 mg/dL (ref 8.9–10.3)
Chloride: 100 mmol/L (ref 98–111)
Creatinine, Ser: 2.23 mg/dL — ABNORMAL HIGH (ref 0.44–1.00)
GFR, Estimated: 24 mL/min — ABNORMAL LOW (ref >60)
Glucose, Bld: 131 mg/dL — ABNORMAL HIGH (ref 70–99)
Potassium: 3.2 mmol/L — ABNORMAL LOW (ref 3.5–5.1)
Sodium: 140 mmol/L (ref 135–145)
Total Bilirubin: 0.8 mg/dL (ref 0.3–1.2)
Total Protein: 6.7 g/dL (ref 6.5–8.1)

## 2021-10-17 LAB — D-DIMER, QUANTITATIVE: D-Dimer, Quant: <0.27 ug/mL-FEU (ref 0.00–0.50)

## 2021-10-17 MED ORDER — MORPHINE SULFATE (PF) 4 MG/ML IV SOLN
4.0000 mg | Freq: Once | INTRAVENOUS | Status: AC
  Administered 2021-10-17: 4 mg via INTRAVENOUS
  Filled 2021-10-17: qty 1

## 2021-10-17 MED ORDER — OXYCODONE-ACETAMINOPHEN 5-325 MG PO TABS: 1.0000 | ORAL_TABLET | Freq: Four times a day (QID) | ORAL | 0 refills | Status: DC | PRN

## 2021-10-17 MED ORDER — ONDANSETRON HCL 4 MG/2ML IJ SOLN
4.0000 mg | Freq: Once | INTRAMUSCULAR | Status: AC
  Administered 2021-10-17: 4 mg via INTRAVENOUS
  Filled 2021-10-17: qty 2

## 2021-10-17 MED ORDER — ONDANSETRON 4 MG PO TBDP: 4.0000 mg | ORAL_TABLET | Freq: Three times a day (TID) | ORAL | 0 refills | Status: DC | PRN

## 2021-10-17 MED ORDER — APIXABAN 5 MG PO TABS
5.0000 mg | ORAL_TABLET | Freq: Once | ORAL | Status: AC
  Administered 2021-10-17: 5 mg via ORAL
  Filled 2021-10-17: qty 1

## 2021-10-17 MED ORDER — SODIUM CHLORIDE 0.9 % IV SOLN: INTRAVENOUS | Status: DC

## 2021-10-17 MED ORDER — AMIODARONE HCL 200 MG PO TABS
200.0000 mg | ORAL_TABLET | Freq: Every day | ORAL | Status: DC
  Administered 2021-10-17: 200 mg via ORAL
  Filled 2021-10-17: qty 1

## 2021-10-17 MED ORDER — TORSEMIDE 20 MG PO TABS
20.0000 mg | ORAL_TABLET | Freq: Once | ORAL | Status: AC
  Administered 2021-10-17: 20 mg via ORAL
  Filled 2021-10-17: qty 1

## 2021-10-17 MED ORDER — LORAZEPAM 2 MG/ML IJ SOLN
1.0000 mg | Freq: Once | INTRAMUSCULAR | Status: AC
  Administered 2021-10-17: 1 mg via INTRAVENOUS
  Filled 2021-10-17: qty 1

## 2021-10-17 NOTE — Discharge Instructions (Addendum)
It is very important to take your meds as directed; every day.  Your urinalysis did not show evidence of infection  Please follow-up with your cardiologist  It was a pleasure caring for you today in the emergency department.  Please return to the emergency department for any worsening or worrisome symptoms.

## 2021-10-17 NOTE — ED Provider Notes (Signed)
°  Provider Note MRN:  [id number]  Arrival date & time: 10/17/21    ED Course and Medical Decision Making  Assumed care from Dr Gilford Raid at shift change at 5:24 PM  See not from prior team for complete details, in brief: 66 year old female with history of cardiomyopathy presents to the emergency department secondary to chest pain.  Has not taken her medication x2 days.  She has been having chest pain x1 week.  Intermittent initially but now is been constant.  Cardiac work-up in the emergency department stable.  Given dose her home medications while in the emergency department.    Plan per prior physician urinalysis, outpatient follow-up with cardiology  Urinalysis has resulted and does not show evidence of acute infection.  The patient's chest pain is not suggestive of pulmonary embolus, cardiac ischemia, aortic dissection, pericarditis, myocarditis, pulmonary embolism, pneumothorax, pneumonia, Zoster, or esophageal perforation, or other serious etiology.  Historically not abrupt in onset, tearing or ripping, pulses symmetric. EKG nonspecific for ischemia/infarction. No dysrhythmias, brugada, WPW, prolonged QT noted.   Troponin negative x2. CXR reviewed. Labs without demonstration of acute pathology unless otherwise noted above. HEART score </= 4.   Given the extremely low risk of these diagnoses further testing and evaluation for these possibilities does not appear to be indicated at this time. Patient in no distress and overall condition improved here in the ED. Detailed discussions were had with the patient regarding current findings, and need for close f/u with PCP or on call doctor. The patient has been instructed to return immediately if the symptoms worsen in any way for re-evaluation. Patient verbalized understanding and is in agreement with current care plan. All questions answered prior to discharge.   Procedures  Final Clinical Impressions(s) / ED Diagnoses     ICD-10-CM   1.  Atypical chest pain  R07.89     2. Chronic congestive heart failure, unspecified heart failure type ([place])  I50.9     3. Atrial fibrillation, unspecified type ([place])  I48.91     4. Nausea  R11.0     5. CKD (chronic kidney disease) stage 4, GFR 15-29 ml/min ([facility])  N18.4     6. Chronic anemia  D64.9       ED Discharge Orders          Ordered    ondansetron (ZOFRAN-ODT) 4 MG disintegrating tablet  Every 8 hours PRN        10/17/21 1700    oxyCODONE-acetaminophen (PERCOCET/ROXICET) 5-325 MG tablet  Every 6 hours PRN        10/17/21 1700              Discharge Instructions      It is very important to take your meds as directed; every day.  Your urinalysis did not show evidence of infection  Please follow-up with your cardiologist  It was a pleasure caring for you today in the emergency department.  Please return to the emergency department for any worsening or worrisome symptoms.         Jeanell Sparrow, DO 10/17/21 [phone]

## 2021-10-17 NOTE — ED Provider Notes (Signed)
[facility] EMERGENCY DEPARTMENT Provider Note   CSN: [id number] Arrival date & time: 10/17/21  1026     History  Chief Complaint  Patient presents with   Chest Pain    [name] is a 66 y.o. female.  Pt is a 66 year old female with numerous comorbidities including diastolic CHF, COPD, diabetes, hypertension, obesity, atrial fibrillation on Eliquis, presents with a chief complaint of chest pain.  Pt said she has had cp for a week.  It was intermittent and now it is constant.  She said pain is worse when she takes a deep breath.  She has not taken her meds for 2 days.  She was given asa by ems.   Pt has been seen in the ED multiple times for the same.  She has seen cards.      Home Medications Prior to Admission medications   Medication Sig Start Date End Date Taking? Authorizing Provider  ondansetron (ZOFRAN-ODT) 4 MG disintegrating tablet Take 1 tablet (4 mg total) by mouth every 8 (eight) hours as needed for nausea or vomiting. 10/17/21  Yes Isla Pence, MD  oxyCODONE-acetaminophen (PERCOCET/ROXICET) 5-325 MG tablet Take 1 tablet by mouth every 6 (six) hours as needed for severe pain. 10/17/21  Yes Isla Pence, MD  acetaminophen (TYLENOL) 500 MG tablet Take 1,000 mg by mouth every 6 (six) hours as needed for mild pain.    [provider]  albuterol (VENTOLIN HFA) 108 (90 Base) MCG/ACT inhaler INHALE 2 PUFFS BY MOUTH EVERY 6 HOURS AS NEEDED 10/06/21   [name], NP  Alcohol Swabs (B-D SINGLE USE SWABS REGULAR) PADS 1 each by Other route daily. 07/06/21   [name], NP  Alcohol Swabs PADS 1 application by Does not apply route in the morning, at noon, and at bedtime. 09/14/21   [name], NP  amiodarone (PACERONE) 200 MG tablet Take 1 tablet (200 mg total) by mouth daily. 09/27/21   Vickie Epley, MD  apixaban (ELIQUIS) 5 MG TABS tablet Take 1 tablet (5 mg total) by mouth 2 (two) times daily. 05/16/21   [place],  Maricela Bo, FNP  benzonatate (TESSALON) 200 MG capsule Take 1 capsule (200 mg total) by mouth 2 (two) times daily as needed for cough. 09/02/21   Aline August, MD  blood glucose meter kit and supplies KIT Dispense based on patient and insurance preference. Use up to four times daily as directed. 02/10/21   Patrecia Pour, MD  blood glucose meter kit and supplies Dispense based on patient and insurance preference. Use up to four times daily as directed. (FOR ICD-10 E10.9, E11.9). 07/06/21   [name], NP  Blood Glucose Monitoring Suppl (ACCU-CHEK GUIDE ME) w/Device KIT Use as instructed to check blood sugar three times daily. E11.69 04/21/20   Charlott Rakes, MD  busPIRone (BUSPAR) 5 MG tablet Take 1 tablet (5 mg total) by mouth 2 (two) times daily. 09/14/21   [name], NP  colchicine 0.6 MG tablet Take 0.3 mg by mouth daily.    [provider]  docusate sodium (COLACE) 100 MG capsule Take 100 mg by mouth 2 (two) times daily.    [provider]  DULoxetine (CYMBALTA) 60 MG capsule Take 1 capsule (60 mg total) by mouth daily. 09/14/21   [name], NP  EPINEPHrine 0.3 mg/0.3 mL IJ SOAJ injection Inject 0.3 mg into the muscle as needed for anaphylaxis. 06/25/20   [name], NP  fluticasone Asencion Islam)  50 MCG/ACT nasal spray Place 2 sprays into both nostrils daily. 05/24/21   [name], NP  glucose blood (ACCU-CHEK GUIDE) test strip Use to check blood sugar TID. E11.69 07/13/21   Charlott Rakes, MD  guaiFENesin-dextromethorphan (ROBITUSSIN DM) 100-10 MG/5ML syrup Take 10 mLs by mouth every 4 (four) hours as needed for cough. 09/02/21   Aline August, MD  insulin aspart (NOVOLOG) 100 UNIT/ML injection Inject 2-10 Units into the skin 3 (three) times daily before meals. Per sliding scale 09/02/21   Aline August, MD  loratadine (CLARITIN) 10 MG tablet Take 1 tablet (10 mg total) by mouth daily. 09/14/21   [name], NP  metolazone  (ZAROXOLYN) 2.5 MG tablet Take 1 tablet (2.5 mg total) by mouth every Monday, Wednesday, and Friday. 09/07/21   Aline August, MD  metoprolol succinate (TOPROL XL) 25 MG 24 hr tablet Take 1.5 tablets (37.5 mg total) by mouth daily. 09/09/21 09/09/22  Bensimhon, Shaune Pascal, MD  Multiple Vitamins-Minerals (MULTIVITAMIN WOMEN PO) Take 1 tablet by mouth daily.    [provider]  nitroGLYCERIN (NITROSTAT) 0.4 MG SL tablet Place 1 tablet (0.4 mg total) under the tongue every 5 (five) minutes x 3 doses as needed for chest pain. 01/08/14   Charolette Forward, MD  oxyCODONE (ROXICODONE) 5 MG immediate release tablet Take 1 tablet (5 mg total) by mouth every 4 (four) hours as needed for severe pain. 09/23/21   Sherwood Gambler, MD  pantoprazole (PROTONIX) 40 MG tablet Take 1 tablet (40 mg total) by mouth daily. 09/14/21   [name], NP  potassium chloride SA (KLOR-CON M) 20 MEQ tablet Take 2 tablets (40 mEq total) by mouth daily. Take an additional 20 meq with every dose of METOLAZONE 09/02/21   Aline August, MD  RESTASIS 0.05 % ophthalmic emulsion Place 1 drop into both eyes 2 (two) times daily. 11/19/17   [provider]  rosuvastatin (CRESTOR) 10 MG tablet Take 1 tablet (10 mg total) by mouth every evening. 09/02/21   Aline August, MD  torsemide (DEMADEX) 20 MG tablet Take 2 tablets (40 mg total) by mouth 2 (two) times daily. 09/08/21   Bensimhon, Shaune Pascal, MD  TRULICITY 1.5 OZ/2.2QM SOPN INJECT 1.5 MG INTO THE SKIN ONCE A WEEK. 08/19/21 11/17/21  [name], NP      Allergies    Bee pollen and Sulfa antibiotics    Review of Systems   Review of Systems  Cardiovascular:  Positive for chest pain.  All other systems reviewed and are negative.  Physical Exam Updated Vital Signs BP 103/83    Pulse (!) 55    Temp 98.2 F (36.8 C) (Oral)    Resp 18    SpO2 98%  Physical Exam Vitals and nursing note reviewed.  Constitutional:      Appearance: She is well-developed.  HENT:      Head: Normocephalic and atraumatic.  Eyes:     Extraocular Movements: Extraocular movements intact.     Pupils: Pupils are equal, round, and reactive to light.  Cardiovascular:     Rate and Rhythm: Normal rate. Rhythm irregular.     Heart sounds: Normal heart sounds.  Pulmonary:     Effort: Pulmonary effort is normal.     Breath sounds: Normal breath sounds.  Abdominal:     General: Bowel sounds are normal.     Palpations: Abdomen is soft.  Musculoskeletal:        General: Normal range of motion.  Cervical back: Normal range of motion and neck supple.  Skin:    General: Skin is warm.     Capillary Refill: Capillary refill takes less than 2 seconds.  Neurological:     General: No focal deficit present.     Mental Status: She is alert and oriented to person, place, and time.  Psychiatric:        Mood and Affect: Mood normal.        Behavior: Behavior normal.    ED Results / Procedures / Treatments   Labs (all labs ordered are listed, but only abnormal results are displayed) Labs Reviewed  CBC - Abnormal; Notable for the following components:      Result Value   RBC 3.83 (*)    Hemoglobin 11.3 (*)    HCT 34.3 (*)    All other components within normal limits  COMPREHENSIVE METABOLIC PANEL - Abnormal; Notable for the following components:   Potassium 3.2 (*)    Glucose, Bld 131 (*)    Creatinine, Ser 2.23 (*)    Albumin 2.7 (*)    Alkaline Phosphatase 142 (*)    GFR, Estimated 24 (*)    All other components within normal limits  RESP PANEL BY RT-PCR (FLU A&B, COVID) ARPGX2  D-DIMER, QUANTITATIVE  URINALYSIS, ROUTINE W REFLEX MICROSCOPIC  TROPONIN I (HIGH SENSITIVITY)  TROPONIN I (HIGH SENSITIVITY)    EKG EKG Interpretation  Date/Time:  Monday October 17 2021 10:37:04 EST Ventricular Rate:  106 PR Interval:    QRS Duration: 115 QT Interval:  436 QTC Calculation: 580 R Axis:   143 Text Interpretation: Atrial fibrillation IRBBB and LPFB Consider anterior  infarct Nonspecific T abnormalities, lateral leads back in afib Confirmed by Isla Pence [phone]) on 10/17/2021 10:41:37 AM  Radiology CT Chest Wo Contrast  Result Date: 10/17/2021 CLINICAL DATA:  Dyspnea, chronic, unclear etiology EXAM: CT CHEST WITHOUT CONTRAST TECHNIQUE: Multidetector CT imaging of the chest was performed following the standard protocol without IV contrast. RADIATION DOSE REDUCTION: This exam was performed according to the departmental dose-optimization program which includes automated exposure control, adjustment of the mA and/or kV according to patient size and/or use of iterative reconstruction technique. COMPARISON:  06/21/2021 FINDINGS: Cardiovascular: Cardiomegaly. No pericardial effusion. Left atrial appendage clip. Mitral and tricuspid valve repair. Thoracic aorta is normal in caliber with mild plaque. Mediastinum/Nodes: No enlarged nodes.  Thyroid is unremarkable. Lungs/Pleura: Bibasilar atelectasis/scarring. Patchy ground-glass density may reflect atelectasis or edema. No pleural effusion or pneumothorax. Upper Abdomen: No acute abnormality. Musculoskeletal: No acute osseous abnormality. IMPRESSION: Bibasilar atelectasis/scarring. Patchy ground-glass density may reflect atelectasis or edema. Global cardiomegaly. Electronically Signed   By: Macy Mis M.D.   On: 10/17/2021 16:34   DG Chest Portable 1 View  Result Date: 10/17/2021 CLINICAL DATA:  Shortness of breath. EXAM: PORTABLE CHEST 1 VIEW COMPARISON:  September 23, 2021. FINDINGS: Stable cardiomegaly. Status post cardiac valve repair. Both lungs are clear. The visualized skeletal structures are unremarkable. IMPRESSION: No active disease. Electronically Signed   By: Marijo Conception M.D.   On: 10/17/2021 10:58    Procedures Procedures    Medications Ordered in ED Medications  0.9 %  sodium chloride infusion ( Intravenous New Bag/Given 10/17/21 1257)  amiodarone (PACERONE) tablet 200 mg (has no administration in  time range)  apixaban (ELIQUIS) tablet 5 mg (has no administration in time range)  torsemide (DEMADEX) tablet 20 mg (has no administration in time range)  ondansetron (ZOFRAN) injection 4 mg (4 mg Intravenous Given  10/17/21 1257)  morphine (PF) 4 MG/ML injection 4 mg (4 mg Intravenous Given 10/17/21 1329)  LORazepam (ATIVAN) injection 1 mg (1 mg Intravenous Given 10/17/21 1449)    ED Course/ Medical Decision Making/ A&P                           Medical Decision Making Amount and/or Complexity of Data Reviewed Labs: ordered. Radiology: ordered.  Risk Prescription drug management.   This patient presents to the ED for concern of cp, this involves an extensive number of treatment options, and is a complaint that carries with it a high risk of complications and morbidity.  The differential diagnosis includes cardiac, pulmonary, msk, gi   Co morbidities that complicate the patient evaluation  diastolic CHF, COPD, diabetes, hypertension, obesity, atrial fibrillation on Eliqui   Additional history obtained:  Additional history obtained from epic chart review  Lab Tests:  I Ordered, and personally interpreted labs.  The pertinent results include:  cbc with chronic anemia, chronic kidney disease with gfr of 24   Imaging Studies ordered:  I ordered imaging studies including cxr and ct chest I independently visualized and interpreted imaging which showed cxr shows nothing acute, CT chest shows   IMPRESSION:  Bibasilar atelectasis/scarring. Patchy ground-glass density may  reflect atelectasis or edema. Global cardiomegaly.   I agree with the radiologist interpretation   Cardiac Monitoring:  The patient was maintained on a cardiac monitor.  I personally viewed and interpreted the cardiac monitored which showed an underlying rhythm of: afib   Medicines ordered and prescription drug management:  I ordered medication including morphine and ativan  for cp  Reevaluation of the  patient after these medicines showed that the patient stayed the same I have reviewed the patients home medicines and have made adjustments as needed   Test Considered:  Cta chest to r/o pe, but ddimer neg   Problem List / ED Course:  Cp:  atypical and occurs with deep breaths.  Ddimer neg. Pt will be d/c with a short course of percocet. Medication noncompliance:  she is encouraged to take meds as directed.  She is given a dose of her demadex, amio, and eliquis in the ED   Reevaluation:  After the interventions noted above, I reevaluated the patient and found that they have :improved   Dispostion:  After consideration of the diagnostic results and the patients response to treatment, I feel that the patent would benefit from d/c with close outpatient f/u.          Final Clinical Impression(s) / ED Diagnoses Final diagnoses:  Atypical chest pain  Chronic congestive heart failure, unspecified heart failure type [facility])  Atrial fibrillation, unspecified type ([place])  Nausea    Rx / DC Orders ED Discharge Orders          Ordered    ondansetron (ZOFRAN-ODT) 4 MG disintegrating tablet  Every 8 hours PRN        10/17/21 1700    oxyCODONE-acetaminophen (PERCOCET/ROXICET) 5-325 MG tablet  Every 6 hours PRN        10/17/21 1700              Isla Pence, MD 10/17/21 1705

## 2021-10-17 NOTE — ED Triage Notes (Signed)
Pt BIB [facility] from home d/t CP for 1 week VSS, nausea, no vomit, no meds for 2 days d/t the nausea, EMS gave ASA d/t cardiac HX she is in AFIB at 80 bpm with PVC's, 98% on RA. CBG 197, A/Ox4.

## 2021-10-19 ENCOUNTER — Other Ambulatory Visit (INDEPENDENT_AMBULATORY_CARE_PROVIDER_SITE_OTHER): Payer: Self-pay | Admitting: Primary Care

## 2021-10-19 ENCOUNTER — Telehealth (HOSPITAL_COMMUNITY): Payer: Self-pay

## 2021-10-19 DIAGNOSIS — Z760 Encounter for issue of repeat prescription: Secondary | ICD-10-CM

## 2021-10-19 MED ORDER — TRULICITY 1.5 MG/0.5ML ~~LOC~~ SOAJ: 1.5000 mg | SUBCUTANEOUS | 2 refills | Status: DC

## 2021-10-19 NOTE — Telephone Encounter (Signed)
Spoke to [place] who reports she is doing okay but is continuing to have ongoing chest pain when she is taking deep breaths as well as nausea. She reports being seen in ED Monday and was told to follow up with [facility] and PCP. She is being seen by PCP tomorrow and plans to call [facility] to see if she can be seen sooner by Dr. Harrell Gave. I plan to call her Monday to follow up with paramedicine plan regarding HF clinic. She agreed and reports she has all her medications and is filling her own pill box with no issues or concerns. Call complete.  ?

## 2021-10-20 ENCOUNTER — Inpatient Hospital Stay (INDEPENDENT_AMBULATORY_CARE_PROVIDER_SITE_OTHER): Payer: 59 | Admitting: Primary Care

## 2021-10-24 ENCOUNTER — Telehealth: Payer: Self-pay | Admitting: Cardiology

## 2021-10-24 NOTE — Telephone Encounter (Signed)
Pt called with complaints of chest pain.  I called back but she did not answer.  I left a message to come to ER, she has been seen in ER several times for the same.   ? ?Triage please check on her 10/25/21 and if possible have her seen this week.   ? ?Thanks. ?

## 2021-10-25 NOTE — Telephone Encounter (Signed)
Routed to [facility] triage °

## 2021-10-25 NOTE — Telephone Encounter (Signed)
Left message for patient to call back  

## 2021-10-26 NOTE — Telephone Encounter (Signed)
Left message for patient to call back  

## 2021-10-27 NOTE — Telephone Encounter (Signed)
3rd call attempt to patient, at this time her VM is full and I am unable to leave a message. Patient has previously called for similar issue and has been advised to present to ED  ?

## 2021-10-28 ENCOUNTER — Encounter (INDEPENDENT_AMBULATORY_CARE_PROVIDER_SITE_OTHER): Payer: Self-pay

## 2021-10-28 ENCOUNTER — Other Ambulatory Visit: Payer: Self-pay

## 2021-10-28 ENCOUNTER — Ambulatory Visit (INDEPENDENT_AMBULATORY_CARE_PROVIDER_SITE_OTHER): Payer: 59

## 2021-10-28 ENCOUNTER — Other Ambulatory Visit: Payer: Self-pay | Admitting: Physician Assistant

## 2021-10-28 DIAGNOSIS — J0100 Acute maxillary sinusitis, unspecified: Secondary | ICD-10-CM

## 2021-10-28 DIAGNOSIS — Z0000 Encounter for general adult medical examination without abnormal findings: Secondary | ICD-10-CM

## 2021-10-28 NOTE — Progress Notes (Signed)
Subjective:   [name] is a 66 y.o. female who presents for an Initial Medicare Annual Wellness Visit. I connected with  [name] on 10/28/21 by a audio enabled telemedicine application and verified that I am speaking with the correct person using two identifiers.  Patient Location: Home  Provider Location: Home Office  I discussed the limitations of evaluation and management by telemedicine. The patient expressed understanding and agreed to proceed.    Objective:    Today's Vitals   10/28/21 1231  PainSc: 8    There is no height or weight on file to calculate BMI.  Advanced Directives 10/17/2021 09/23/2021 09/19/2021 08/26/2021 07/30/2021 07/28/2021 05/03/2021  Does Patient Have a Medical Advance Directive? No No No No - No No  Would patient like information on creating a medical advance directive? No - Patient declined - No - Patient declined No - Patient declined No - Patient declined - No - Patient declined  Pre-existing out of facility DNR order (yellow form or pink MOST form) - - - - - - -  Some encounter information is confidential and restricted. Go to Review Flowsheets activity to see all data.    Current Medications (verified) Outpatient Encounter Medications as of 10/28/2021  Medication Sig   acetaminophen (TYLENOL) 500 MG tablet Take 1,000 mg by mouth every 6 (six) hours as needed for mild pain.   albuterol (VENTOLIN HFA) 108 (90 Base) MCG/ACT inhaler INHALE 2 PUFFS BY MOUTH EVERY 6 HOURS AS NEEDED   Alcohol Swabs (B-D SINGLE USE SWABS REGULAR) PADS 1 each by Other route daily.   Alcohol Swabs PADS 1 application by Does not apply route in the morning, at noon, and at bedtime.   amiodarone (PACERONE) 200 MG tablet Take 1 tablet (200 mg total) by mouth daily.   apixaban (ELIQUIS) 5 MG TABS tablet Take 1 tablet (5 mg total) by mouth 2 (two) times daily.   benzonatate (TESSALON) 200 MG capsule Take 1 capsule (200 mg total) by mouth 2 (two) times daily as  needed for cough.   blood glucose meter kit and supplies KIT Dispense based on patient and insurance preference. Use up to four times daily as directed.   blood glucose meter kit and supplies Dispense based on patient and insurance preference. Use up to four times daily as directed. (FOR ICD-10 E10.9, E11.9).   Blood Glucose Monitoring Suppl (ACCU-CHEK GUIDE ME) w/Device KIT Use as instructed to check blood sugar three times daily. E11.69   busPIRone (BUSPAR) 5 MG tablet Take 1 tablet (5 mg total) by mouth 2 (two) times daily.   colchicine 0.6 MG tablet Take 0.3 mg by mouth daily.   docusate sodium (COLACE) 100 MG capsule Take 100 mg by mouth 2 (two) times daily.   Dulaglutide (TRULICITY) 1.5 MG/0.5ML SOPN Inject 1.5 mg into the skin once a week.   DULoxetine (CYMBALTA) 60 MG capsule Take 1 capsule (60 mg total) by mouth daily.   EPINEPHrine 0.3 mg/0.3 mL IJ SOAJ injection Inject 0.3 mg into the muscle as needed for anaphylaxis.   fluticasone (FLONASE) 50 MCG/ACT nasal spray Place 2 sprays into both nostrils daily.   glucose blood (ACCU-CHEK GUIDE) test strip Use to check blood sugar TID. E11.69   insulin aspart (NOVOLOG) 100 UNIT/ML injection Inject 2-10 Units into the skin 3 (three) times daily before meals. Per sliding scale   loratadine (CLARITIN) 10 MG tablet Take 1 tablet (10 mg total) by mouth daily.   metolazone (ZAROXOLYN) 2.5 MG tablet  Take 1 tablet (2.5 mg total) by mouth every Monday, Wednesday, and Friday.   metoprolol succinate (TOPROL XL) 25 MG 24 hr tablet Take 1.5 tablets (37.5 mg total) by mouth daily.   Multiple Vitamins-Minerals (MULTIVITAMIN WOMEN PO) Take 1 tablet by mouth daily.   nitroGLYCERIN (NITROSTAT) 0.4 MG SL tablet Place 1 tablet (0.4 mg total) under the tongue every 5 (five) minutes x 3 doses as needed for chest pain.   ondansetron (ZOFRAN-ODT) 4 MG disintegrating tablet Take 1 tablet (4 mg total) by mouth every 8 (eight) hours as needed for nausea or vomiting.    pantoprazole (PROTONIX) 40 MG tablet Take 1 tablet (40 mg total) by mouth daily.   potassium chloride SA (KLOR-CON M) 20 MEQ tablet Take 2 tablets (40 mEq total) by mouth daily. Take an additional 20 meq with every dose of METOLAZONE   RESTASIS 0.05 % ophthalmic emulsion Place 1 drop into both eyes 2 (two) times daily.   rosuvastatin (CRESTOR) 10 MG tablet Take 1 tablet (10 mg total) by mouth every evening.   torsemide (DEMADEX) 20 MG tablet Take 2 tablets (40 mg total) by mouth 2 (two) times daily.   guaiFENesin-dextromethorphan (ROBITUSSIN DM) 100-10 MG/5ML syrup Take 10 mLs by mouth every 4 (four) hours as needed for cough. (Patient not taking: Reported on 10/28/2021)   oxyCODONE (ROXICODONE) 5 MG immediate release tablet Take 1 tablet (5 mg total) by mouth every 4 (four) hours as needed for severe pain. (Patient not taking: Reported on 10/28/2021)   oxyCODONE-acetaminophen (PERCOCET/ROXICET) 5-325 MG tablet Take 1 tablet by mouth every 6 (six) hours as needed for severe pain. (Patient not taking: Reported on 10/28/2021)   No facility-administered encounter medications on file as of 10/28/2021.    Allergies (verified) Bee pollen and Sulfa antibiotics   History: Past Medical History:  Diagnosis Date   Allergic rhinitis    Arthritis    Asthma    Chest pain 12/27/2017   Chronic diastolic CHF (congestive heart failure) (HCC)    COPD (chronic obstructive pulmonary disease) (HCC)    Depression    DM (diabetes mellitus) ([place])    DVT (deep vein thrombosis) in pregnancy    Dysrhythmia    atrial fibrilation   GERD (gastroesophageal reflux disease)    Headache(784.0)    HTN (hypertension)    Hyperlipidemia    Obesity    Pneumonia 04/2018   RIGHT LOBE   Shortness of breath    Sleep apnea    compliant with CPAP   Past Surgical History:  Procedure Laterality Date   ABDOMINAL HYSTERECTOMY     BUBBLE STUDY  11/26/2020   Procedure: BUBBLE STUDY;  Surgeon: Buford Dresser, MD;   Location: [place];  Service: Cardiovascular;;   CARDIOVERSION N/A 05/06/2020   Procedure: CARDIOVERSION;  Surgeon: Buford Dresser, MD;  Location: [facility] ENDOSCOPY;  Service: Cardiovascular;  Laterality: N/A;   CARDIOVERSION N/A 11/04/2020   Procedure: CARDIOVERSION;  Surgeon: Lelon Perla, MD;  Location: [place];  Service: Cardiovascular;  Laterality: N/A;   CARDIOVERSION N/A 02/01/2021   Procedure: CARDIOVERSION;  Surgeon: Jolaine Artist, MD;  Location: [place];  Service: Cardiovascular;  Laterality: N/A;   CARDIOVERSION N/A 02/09/2021   Procedure: CARDIOVERSION;  Surgeon: Jolaine Artist, MD;  Location: [place];  Service: Cardiovascular;  Laterality: N/A;   CARDIOVERSION N/A 04/19/2021   Procedure: CARDIOVERSION;  Surgeon: Fay Records, MD;  Location: [facility] ENDOSCOPY;  Service: Cardiovascular;  Laterality: N/A;   CATARACT EXTRACTION, BILATERAL  CHOLECYSTECTOMY     COLONOSCOPY WITH PROPOFOL N/A 03/05/2015   Procedure: COLONOSCOPY WITH PROPOFOL;  Surgeon: Carol Ada, MD;  Location: [facility] ENDOSCOPY;  Service: Endoscopy;  Laterality: N/A;   DIAGNOSTIC LAPAROSCOPY     ingrown hallux Left    KNEE SURGERY     LEFT HEART CATH AND CORONARY ANGIOGRAPHY N/A 12/31/2017   Procedure: LEFT HEART CATH AND CORONARY ANGIOGRAPHY;  Surgeon: Charolette Forward, MD;  Location: [place] CV LAB;  Service: Cardiovascular;  Laterality: N/A;   MAZE N/A 12/29/2020   Procedure: MAZE;  Surgeon: [name], MD;  Location: [place];  Service: Open Heart Surgery;  Laterality: N/A;   MITRAL VALVE REPAIR N/A 12/29/2020   Procedure: MITRAL VALVE REPAIR USING CARBOMEDICS ANNULOFLEX RING SIZE 30;  Surgeon: [name], MD;  Location: [place];  Service: Open Heart Surgery;  Laterality: N/A;   RIGHT HEART CATH N/A 12/22/2020   Procedure: RIGHT HEART CATH;  Surgeon: Larey Dresser, MD;  Location: [place] CV LAB;  Service: Cardiovascular;  Laterality: N/A;   RIGHT HEART CATH N/A  01/31/2021   Procedure: RIGHT HEART CATH;  Surgeon: Jolaine Artist, MD;  Location: [place] CV LAB;  Service: Cardiovascular;  Laterality: N/A;   RIGHT HEART CATH N/A 07/29/2021   Procedure: RIGHT HEART CATH;  Surgeon: Jolaine Artist, MD;  Location: [place] CV LAB;  Service: Cardiovascular;  Laterality: N/A;   TEE WITHOUT CARDIOVERSION N/A 11/26/2020   Procedure: TRANSESOPHAGEAL ECHOCARDIOGRAM (TEE);  Surgeon: Buford Dresser, MD;  Location: [facility] ENDOSCOPY;  Service: Cardiovascular;  Laterality: N/A;   TEE WITHOUT CARDIOVERSION N/A 12/29/2020   Procedure: TRANSESOPHAGEAL ECHOCARDIOGRAM (TEE);  Surgeon: [name], MD;  Location: [place];  Service: Open Heart Surgery;  Laterality: N/A;   TRICUSPID VALVE REPLACEMENT N/A 12/29/2020   Procedure: 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 RING SIZE 34;  Surgeon: [name], MD;  Location: [place];  Service: Open Heart Surgery;  Laterality: N/A;   Family History  Problem Relation Age of Onset   Cancer Mother    Hypertension Mother    Cancer Father    CAD Other    Hypertension Sister    Hypertension Brother    Social History   Socioeconomic History   Marital status: Significant Other    Spouse name: Not on file   Number of children: 2   Years of education: Not on file   Highest education level: Some college, no degree  Occupational History   Not on file  Tobacco Use   Smoking status: Never   Smokeless tobacco: Never  Vaping Use   Vaping Use: Never used  Substance and Sexual Activity   Alcohol use: No   Drug use: No   Sexual activity: Not Currently  Other Topics Concern   Not on file  Social History Narrative   Pt lives in [place] with spouse.  2 grown children.   Previously worked in [employer] at [employer].  Now on disability   Social Determinants of Health   Financial Resource Strain: Low Risk    Difficulty of Paying Living Expenses: Not hard at all  Food Insecurity: No Food  Insecurity   Worried About Charity fundraiser in the Last Year: Never true   [place] in the Last Year: Never true  Transportation Needs: No Transportation Needs   Lack of Transportation (Medical): No   Lack of Transportation (Non-Medical): No  Physical Activity: Insufficiently Active   Days  of Exercise per Week: 2 days   Minutes of Exercise per Session: 70 min  Stress: No Stress Concern Present   Feeling of Stress : Not at all  Social Connections: Socially Isolated   Frequency of Communication with Friends and Family: More than three times a week   Frequency of Social Gatherings with Friends and Family: Three times a week   Attends Religious Services: Never   Active Member of Clubs or Organizations: No   Attends Archivist Meetings: Never   Marital Status: Widowed    Tobacco Counseling Counseling given: Not Answered   Clinical Intake:  Pre-visit preparation completed: Yes  Pain : 0-10 (patients rates a 8) Pain Score: 8  (chest & under left breast) Pain Type: Acute pain (pt reports discomfort) Pain Location: Other (Comment) Pain Orientation: Left Pain Radiating Towards: from left side to left breast to her chest. Pain Descriptors / Indicators: Discomfort, Burning, Aching, Stabbing, Tightness Pain Onset: More than a month ago (she reports being in and out of the emergency room. no one finding anything.) Pain Relieving Factors: heating pad, tylenol over the counter (patient refuses to take anymore pain medication.)  Pain Relieving Factors: heating pad, tylenol over the counter (patient refuses to take anymore pain medication.)  Nutritional Risks: None Diabetes: Yes CBG done?: No Did pt. bring in CBG monitor from home?: No  How often do you need to have someone help you when you read instructions, pamphlets, or other written materials from your doctor or pharmacy?: 3 - Sometimes (daughter helps) What is the last grade level you completed in school?: 12th  grade  Diabetic?yes   Interpreter Needed?: No      Activities of Daily Living In your present state of health, do you have any difficulty performing the following activities: 10/28/2021 09/03/2021  Hearing? N N  Vision? N N  Difficulty concentrating or making decisions? Y -  Comment sometimes -  Walking or climbing stairs? Y -  Comment sometimes -  Dressing or bathing? N -  Doing errands, shopping? Y -  Some recent data might be hidden    Patient Care Team: [name], NP as PCP - General (Internal Medicine) Buford Dresser, MD as PCP - Cardiology (Cardiology) Vickie Epley, MD as PCP - Electrophysiology (Cardiology) Charolette Forward, MD as Referring Physician (Internal Medicine)  Indicate any recent Medical Services you may have received from other than [facility] providers in the past year (date may be approximate).     Assessment:   This is a routine wellness examination for [place].  Hearing/Vision screen No results found.  Dietary issues and exercise activities discussed:     Goals Addressed   None   Depression Screen PHQ 2/9 Scores 10/28/2021 07/27/2021 05/24/2021 02/15/2021 10/05/2020 10/05/2020 09/24/2020  PHQ - 2 Score 2 0 $R'1 2 2 2 2  'FJ$ PHQ- 9 Score 12 - - $R'9 4 4 4    'eI$ Fall Risk Fall Risk  10/28/2021 07/27/2021 05/24/2021 03/21/2021 09/24/2020  Falls in the past year? 1 0 0 0 1  Number falls in past yr: 1 - - 0 1  Injury with Fall? 0 - - 0 0  Risk Factor Category  - - - - -  Risk for fall due to : History of fall(s) - - No Fall Risks Impaired mobility  Follow up Falls evaluation completed - - - Falls evaluation completed    FALL RISK PREVENTION PERTAINING TO THE HOME:  Any stairs in or around the home? No  If so, are there any without handrails? No  Home free of loose throw rugs in walkways, pet beds, electrical cords, etc? Yes  Adequate lighting in your home to reduce risk of falls? Yes   ASSISTIVE DEVICES UTILIZED TO PREVENT FALLS:  Life alert? No   Use of a cane, walker or w/c? Yes  Grab bars in the bathroom? No  Shower chair or bench in shower? Yes  Elevated toilet seat or a handicapped toilet? Yes    Cognitive Function:     6CIT Screen 10/28/2021 04/26/2017  What Year? 4 points 0 points  What month? 0 points 0 points  What time? 3 points 0 points  Count back from 20 0 points 0 points  Months in reverse 4 points 2 points  Repeat phrase 0 points 8 points  Total Score 11 10    Immunizations Immunization History  Administered Date(s) Administered   Influenza,inj,Quad PF,6+ Mos 05/14/2019, 06/24/2020, 05/04/2021   PFIZER(Purple Top)SARS-COV-2 Vaccination 11/13/2019, 12/04/2019   Pneumococcal Polysaccharide-23 02/27/2012   Tdap 03/04/2019    TDAP status: Up to date  Flu Vaccine status: Up to date  Pneumococcal vaccine status: Due, Education has been provided regarding the importance of this vaccine. Advised may receive this vaccine at local pharmacy or Health Dept. Aware to provide a copy of the vaccination record if obtained from local pharmacy or Health Dept. Verbalized acceptance and understanding.  Covid-19 vaccine status: Information provided on how to obtain vaccines.   Qualifies for Shingles Vaccine? Yes   Zostavax completed No   Shingrix Completed?: No.    Education has been provided regarding the importance of this vaccine. Patient has been advised to call insurance company to determine out of pocket expense if they have not yet received this vaccine. Advised may also receive vaccine at local pharmacy or Health Dept. Verbalized acceptance and understanding.  Screening Tests Health Maintenance  Topic Date Due   OPHTHALMOLOGY EXAM  Never done   Zoster Vaccines- Shingrix (1 of 2) Never done   PAP SMEAR-Modifier  Never done   MAMMOGRAM  Never done   Pneumonia Vaccine 6+ Years old (2 - PCV) 02/26/2013   COVID-19 Vaccine (3 - Pfizer risk series) 01/01/2020   URINE MICROALBUMIN  02/23/2021   DEXA SCAN  Never done    FOOT EXAM  09/24/2021   HEMOGLOBIN A1C  02/24/2022   COLONOSCOPY (Pts 45-68yrs Insurance coverage will need to be confirmed)  03/04/2025   TETANUS/TDAP  03/03/2029   INFLUENZA VACCINE  Completed   Hepatitis C Screening  Completed   HIV Screening  Completed   HPV VACCINES  Aged Out    Health Maintenance  Health Maintenance Due  Topic Date Due   OPHTHALMOLOGY EXAM  Never done   Zoster Vaccines- Shingrix (1 of 2) Never done   PAP SMEAR-Modifier  Never done   MAMMOGRAM  Never done   Pneumonia Vaccine 47+ Years old (2 - PCV) 02/26/2013   COVID-19 Vaccine (3 - Pfizer risk series) 01/01/2020   URINE MICROALBUMIN  02/23/2021   DEXA SCAN  Never done   FOOT EXAM  09/24/2021    Colorectal cancer screening: Referral to GI placed provider notified.Marland Kitchen Pt aware the office will call re: appt.  Mammogram status: Ordered provider notified.Marland Kitchen Pt provided with contact info and advised to call to schedule appt.   Bone Density status: Ordered provider notified.Marland Kitchen Pt provided with contact info and advised to call to schedule appt.  Lung Cancer Screening: (Low Dose CT Chest recommended if  Age 68-80 years, 30 pack-year currently smoking OR have quit w/in 15years.) does not qualify.    Additional Screening:  Hepatitis C Screening: does qualify; Completed 07/03/2018  Vision Screening: Recommended annual ophthalmology exams for early detection of glaucoma and other disorders of the eye. Is the patient up to date with their annual eye exam?  No  Who is the provider or what is the name of the office in which the patient attends annual eye exams? Patient notified of eye  exam due, she will get scheduled soon. If pt is not established with a provider, would they like to be referred to a provider to establish care? No .   Dental Screening: Recommended annual dental exams for proper oral hygiene  Community Resource Referral / Chronic Care Management: CRR required this visit?  No   CCM required this  visit?  Yes      Plan:     I have personally reviewed and noted the following in the patients chart:   Medical and social history Use of alcohol, tobacco or illicit drugs  Current medications and supplements including opioid prescriptions. Patient is currently taking opioid prescriptions. Information provided to patient regarding non-opioid alternatives. Patient advised to discuss non-opioid treatment plan with their provider. Functional ability and status Nutritional status Physical activity Advanced directives List of other physicians Hospitalizations, surgeries, and ER visits in previous 12 months Vitals Screenings to include cognitive, depression, and falls Referrals and appointments  In addition, I have reviewed and discussed with patient certain preventive protocols, quality metrics, and best practice recommendations. A written personalized care plan for preventive services as well as general preventive health recommendations were provided to patient.     Debbora Dus, [place]   10/28/2021   Nurse Notes: patient informed in depth about HM tab. Pt aware to contact PCP to get an appointment scheduled soon.

## 2021-10-28 NOTE — Patient Instructions (Signed)
Health Maintenance, Female °Adopting a healthy lifestyle and getting preventive care are important in promoting health and wellness. Ask your health care provider about: °The right schedule for you to have regular tests and exams. °Things you can do on your own to prevent diseases and keep yourself healthy. °What should I know about diet, weight, and exercise? °Eat a healthy diet ° °Eat a diet that includes plenty of vegetables, fruits, low-fat dairy products, and lean protein. °Do not eat a lot of foods that are high in solid fats, added sugars, or sodium. °Maintain a healthy weight °Body mass index (BMI) is used to identify weight problems. It estimates body fat based on height and weight. Your health care provider can help determine your BMI and help you achieve or maintain a healthy weight. °Get regular exercise °Get regular exercise. This is one of the most important things you can do for your health. Most adults should: °Exercise for at least 150 minutes each week. The exercise should increase your heart rate and make you sweat (moderate-intensity exercise). °Do strengthening exercises at least twice a week. This is in addition to the moderate-intensity exercise. °Spend less time sitting. Even light physical activity can be beneficial. °Watch cholesterol and blood lipids °Have your blood tested for lipids and cholesterol at 66 years of age, then have this test every 5 years. °Have your cholesterol levels checked more often if: °Your lipid or cholesterol levels are high. °You are older than 66 years of age. °You are at high risk for heart disease. °What should I know about cancer screening? °Depending on your health history and family history, you may need to have cancer screening at various ages. This may include screening for: °Breast cancer. °Cervical cancer. °Colorectal cancer. °Skin cancer. °Lung cancer. °What should I know about heart disease, diabetes, and high blood pressure? °Blood pressure and heart  disease °High blood pressure causes heart disease and increases the risk of stroke. This is more likely to develop in people who have high blood pressure readings or are overweight. °Have your blood pressure checked: °Every 3-5 years if you are 18-39 years of age. °Every year if you are 40 years old or older. °Diabetes °Have regular diabetes screenings. This checks your fasting blood sugar level. Have the screening done: °Once every three years after age 40 if you are at a normal weight and have a low risk for diabetes. °More often and at a younger age if you are overweight or have a high risk for diabetes. °What should I know about preventing infection? °Hepatitis B °If you have a higher risk for hepatitis B, you should be screened for this virus. Talk with your health care provider to find out if you are at risk for hepatitis B infection. °Hepatitis C °Testing is recommended for: °Everyone born from 1945 through 1965. °Anyone with known risk factors for hepatitis C. °Sexually transmitted infections (STIs) °Get screened for STIs, including gonorrhea and chlamydia, if: °You are sexually active and are younger than 66 years of age. °You are older than 66 years of age and your health care provider tells you that you are at risk for this type of infection. °Your sexual activity has changed since you were last screened, and you are at increased risk for chlamydia or gonorrhea. Ask your health care provider if you are at risk. °Ask your health care provider about whether you are at high risk for HIV. Your health care provider may recommend a prescription medicine to help prevent HIV   infection. If you choose to take medicine to prevent HIV, you should first get tested for HIV. You should then be tested every 3 months for as long as you are taking the medicine. °Pregnancy °If you are about to stop having your period (premenopausal) and you may become pregnant, seek counseling before you get pregnant. °Take 400 to 800  micrograms (mcg) of folic acid every day if you become pregnant. °Ask for birth control (contraception) if you want to prevent pregnancy. °Osteoporosis and menopause °Osteoporosis is a disease in which the bones lose minerals and strength with aging. This can result in bone fractures. If you are 65 years old or older, or if you are at risk for osteoporosis and fractures, ask your health care provider if you should: °Be screened for bone loss. °Take a calcium or vitamin D supplement to lower your risk of fractures. °Be given hormone replacement therapy (HRT) to treat symptoms of menopause. °Follow these instructions at home: °Alcohol use °Do not drink alcohol if: °Your health care provider tells you not to drink. °You are pregnant, may be pregnant, or are planning to become pregnant. °If you drink alcohol: °Limit how much you have to: °0-1 drink a day. °Know how much alcohol is in your drink. In the [place] one drink equals one 12 oz bottle of beer (355 mL), one 5 oz glass of wine (148 mL), or one 1½ oz glass of hard liquor (44 mL). °Lifestyle °Do not use any products that contain nicotine or tobacco. These products include cigarettes, chewing tobacco, and vaping devices, such as e-cigarettes. If you need help quitting, ask your health care provider. °Do not use street drugs. °Do not share needles. °Ask your health care provider for help if you need support or information about quitting drugs. °General instructions °Schedule regular health, dental, and eye exams. °Stay current with your vaccines. °Tell your health care provider if: °You often feel depressed. °You have ever been abused or do not feel safe at home. °Summary °Adopting a healthy lifestyle and getting preventive care are important in promoting health and wellness. °Follow your health care provider's instructions about healthy diet, exercising, and getting tested or screened for diseases. °Follow your health care provider's instructions on monitoring your  cholesterol and blood pressure. °This information is not intended to replace advice given to you by your health care provider. Make sure you discuss any questions you have with your health care provider. °Document Revised: 12/27/2020 Document Reviewed: 12/27/2020 °Elsevier Patient Education © 2022 [employer] ° °

## 2021-11-08 ENCOUNTER — Encounter (HOSPITAL_BASED_OUTPATIENT_CLINIC_OR_DEPARTMENT_OTHER): Payer: Self-pay | Admitting: Cardiology

## 2021-11-08 ENCOUNTER — Ambulatory Visit (INDEPENDENT_AMBULATORY_CARE_PROVIDER_SITE_OTHER): Payer: 59 | Admitting: Cardiology

## 2021-11-08 ENCOUNTER — Other Ambulatory Visit: Payer: Self-pay

## 2021-11-08 VITALS — BP 134/78 | HR 88 | Ht 67.0 in | Wt 292.6 lb

## 2021-11-08 DIAGNOSIS — I5042 Chronic combined systolic (congestive) and diastolic (congestive) heart failure: Secondary | ICD-10-CM | POA: Diagnosis not present

## 2021-11-08 DIAGNOSIS — R0789 Other chest pain: Secondary | ICD-10-CM

## 2021-11-08 DIAGNOSIS — E1169 Type 2 diabetes mellitus with other specified complication: Secondary | ICD-10-CM

## 2021-11-08 DIAGNOSIS — Z9889 Other specified postprocedural states: Secondary | ICD-10-CM

## 2021-11-08 DIAGNOSIS — I428 Other cardiomyopathies: Secondary | ICD-10-CM

## 2021-11-08 DIAGNOSIS — I484 Atypical atrial flutter: Secondary | ICD-10-CM | POA: Diagnosis not present

## 2021-11-08 DIAGNOSIS — I2510 Atherosclerotic heart disease of native coronary artery without angina pectoris: Secondary | ICD-10-CM

## 2021-11-08 DIAGNOSIS — G8929 Other chronic pain: Secondary | ICD-10-CM

## 2021-11-08 DIAGNOSIS — E669 Obesity, unspecified: Secondary | ICD-10-CM

## 2021-11-08 DIAGNOSIS — I4892 Unspecified atrial flutter: Secondary | ICD-10-CM

## 2021-11-08 MED ORDER — LIDOCAINE 5 % EX OINT: 1.0000 "application " | TOPICAL_OINTMENT | CUTANEOUS | 3 refills | Status: DC | PRN

## 2021-11-08 MED ORDER — LIDOCAINE 5 % EX PTCH: 1.0000 | MEDICATED_PATCH | CUTANEOUS | 3 refills | Status: DC

## 2021-11-08 NOTE — Progress Notes (Signed)
?Cardiology Office Note:   ? ?Date:  11/08/2021  ? ?IDJaclynn Gallagher, DOB February 19, 1956, MRN [id number] ? ?PCP:  [name], NP  ?Cardiologist:  Buford Dresser, MD ? ?Referring MD: [name], NP  ? ?CC: follow up ? ?History of Present Illness:   ? ?[name] is a 66 y.o. female with a hx of atrial fibrillation, chronic systolic and diastolic heart failure, type II diabetes who is seen for hospital follow up today. I initially saw her 03/31/20 as a new consult at the request of [name], NP for the evaluation and management of atrial fibrillation, heart failure. ? ?Today: ?Since her last visit she followed up with Dr. Quentin Ore 09/27/2021. Amiodarone was decreased to 200 mg once daily. On 10/17/2021 she presented to the ED with chest pain x1 week, gradually became constant. Troponin x2 negative, recommended outpatient cardiology follow-up. ? ?Overall, she is not feeling well, and states that she feels feverish with her chest pain. Her chest pain is occurring daily. It is mostly left-sided, but may occur on either side radiating medially. Also, when she lies on her left side she feels a stabbing pain. She believes her pain has been getting worse. Taking pain medication typically does not provide relief.  ? ?She is also suffering from significant shortness of breath associated with fast heart beats. Walking down the hallway today was difficult. Sometimes she feels depressed as she is not able to move like she wants. She reports that she was in Afib when she was last in the hospital. She confirms that amiodarone was cut back to once daily. However, she does not believe there has been much difference. ? ?Lately she does not have much of an appetite, and she generally feels ill and nauseated in the mornings. This also occurs with the smell of food. ? ?She denies any peripheral edema. No lightheadedness, headaches, syncope, orthopnea, or PND. ? ? ?Past Medical History:   ?Diagnosis Date  ? Allergic rhinitis   ? Arthritis   ? Asthma   ? Chest pain 12/27/2017  ? Chronic diastolic CHF (congestive heart failure) ([place])   ? COPD (chronic obstructive pulmonary disease) ([place])   ? Depression   ? DM (diabetes mellitus) ([place])   ? DVT (deep vein thrombosis) in pregnancy   ? Dysrhythmia   ? atrial fibrilation  ? GERD (gastroesophageal reflux disease)   ? Headache(784.0)   ? HTN (hypertension)   ? Hyperlipidemia   ? Obesity   ? Pneumonia 04/2018  ? RIGHT LOBE  ? Shortness of breath   ? Sleep apnea   ? compliant with CPAP  ? ? ?Past Surgical History:  ?Procedure Laterality Date  ? ABDOMINAL HYSTERECTOMY    ? BUBBLE STUDY  11/26/2020  ? Procedure: BUBBLE STUDY;  Surgeon: Buford Dresser, MD;  Location: [place];  Service: Cardiovascular;;  ? CARDIOVERSION N/A 05/06/2020  ? Procedure: CARDIOVERSION;  Surgeon: Buford Dresser, MD;  Location: [place];  Service: Cardiovascular;  Laterality: N/A;  ? CARDIOVERSION N/A 11/04/2020  ? Procedure: CARDIOVERSION;  Surgeon: Lelon Perla, MD;  Location: [place];  Service: Cardiovascular;  Laterality: N/A;  ? CARDIOVERSION N/A 02/01/2021  ? Procedure: CARDIOVERSION;  Surgeon: Jolaine Artist, MD;  Location: [place];  Service: Cardiovascular;  Laterality: N/A;  ? CARDIOVERSION N/A 02/09/2021  ? Procedure: CARDIOVERSION;  Surgeon: Jolaine Artist, MD;  Location: [place];  Service: Cardiovascular;  Laterality: N/A;  ? CARDIOVERSION N/A 04/19/2021  ? Procedure: CARDIOVERSION;  Surgeon: Harrington Challenger,  Carmin Muskrat, MD;  Location: [facility]COPY;  Service: Cardiovascular;  Laterality: N/A;  ? CATARACT EXTRACTION, BILATERAL    ? CHOLECYSTECTOMY    ? COLONOSCOPY WITH PROPOFOL N/A 03/05/2015  ? Procedure: COLONOSCOPY WITH PROPOFOL;  Surgeon: Carol Ada, MD;  Location: [facility];  Service: Endoscopy;  Laterality: N/A;  ? DIAGNOSTIC LAPAROSCOPY    ? ingrown hallux Left   ? KNEE SURGERY    ? LEFT HEART CATH AND CORONARY ANGIOGRAPHY N/A  12/31/2017  ? Procedure: LEFT HEART CATH AND CORONARY ANGIOGRAPHY;  Surgeon: Charolette Forward, MD;  Location: [place] [facility];  Service: Cardiovascular;  Laterality: N/A;  ? MAZE N/A 12/29/2020  ? Procedure: MAZE;  Surgeon: [name], MD;  Location: [place];  Service: Open Heart Surgery;  Laterality: N/A;  ? MITRAL VALVE REPAIR N/A 12/29/2020  ? Procedure: MITRAL VALVE REPAIR USING CARBOMEDICS ANNULOFLEX [place] SIZE 30;  Surgeon: [name], MD;  Location: [place];  Service: Open Heart Surgery;  Laterality: N/A;  ? RIGHT HEART CATH N/A 12/22/2020  ? Procedure: RIGHT HEART CATH;  Surgeon: Larey Dresser, MD;  Location: [place] [facility];  Service: Cardiovascular;  Laterality: N/A;  ? RIGHT HEART CATH N/A 01/31/2021  ? Procedure: RIGHT HEART CATH;  Surgeon: Jolaine Artist, MD;  Location: [place] [facility];  Service: Cardiovascular;  Laterality: N/A;  ? RIGHT HEART CATH N/A 07/29/2021  ? Procedure: RIGHT HEART CATH;  Surgeon: Jolaine Artist, MD;  Location: [place] [facility];  Service: Cardiovascular;  Laterality: N/A;  ? TEE WITHOUT CARDIOVERSION N/A 11/26/2020  ? Procedure: TRANSESOPHAGEAL ECHOCARDIOGRAM (TEE);  Surgeon: Buford Dresser, MD;  Location: [place];  Service: Cardiovascular;  Laterality: N/A;  ? TEE WITHOUT CARDIOVERSION N/A 12/29/2020  ? Procedure: TRANSESOPHAGEAL ECHOCARDIOGRAM (TEE);  Surgeon: [name], MD;  Location: [place];  Service: Open Heart Surgery;  Laterality: N/A;  ? TRICUSPID VALVE REPLACEMENT N/A 12/29/2020  ? 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;  Service: Open Heart Surgery;  Laterality: N/A;  ? ? ?Current Medications: ?Current Outpatient Medications on File Prior to Visit  ?Medication Sig  ? acetaminophen (TYLENOL) 500 MG tablet Take 1,000 mg by mouth every 6 (six) hours as needed for mild pain.  ? albuterol (VENTOLIN HFA) 108 (90 Base) MCG/ACT inhaler  INHALE 2 PUFFS BY MOUTH EVERY 6 HOURS AS NEEDED  ? Alcohol Swabs (B-D SINGLE USE SWABS REGULAR) PADS 1 each by Other route daily.  ? Alcohol Swabs PADS 1 application by Does not apply route in the morning, at noon, and at bedtime.  ? amiodarone (PACERONE) 200 MG tablet Take 1 tablet (200 mg total) by mouth daily. (Patient taking differently: Take 200 mg by mouth 2 (two) times daily.)  ? apixaban (ELIQUIS) 5 MG TABS tablet Take 1 tablet (5 mg total) by mouth 2 (two) times daily.  ? blood glucose meter kit and supplies KIT Dispense based on patient and insurance preference. Use up to four times daily as directed.  ? blood glucose meter kit and supplies Dispense based on patient and insurance preference. Use up to four times daily as directed. (FOR ICD-10 E10.9, E11.9).  ? Blood Glucose Monitoring Suppl (ACCU-CHEK GUIDE ME) w/Device KIT Use as instructed to check blood sugar three times daily. E11.69  ? busPIRone (BUSPAR) 5 MG tablet Take 1 tablet (5 mg total) by mouth 2 (two) times daily.  ? colchicine 0.6  MG tablet Take 0.3 mg by mouth daily.  ? docusate sodium (COLACE) 100 MG capsule Take 100 mg by mouth 2 (two) times daily.  ? Dulaglutide (TRULICITY) 1.5 SR/1.5XY SOPN Inject 1.5 mg into the skin once a week.  ? DULoxetine (CYMBALTA) 60 MG capsule Take 1 capsule (60 mg total) by mouth daily.  ? EPINEPHrine 0.3 mg/0.3 mL IJ SOAJ injection Inject 0.3 mg into the muscle as needed for anaphylaxis.  ? fluticasone (FLONASE) 50 MCG/ACT nasal spray Place 2 sprays into both nostrils daily.  ? glucose blood (ACCU-CHEK GUIDE) test strip Use to check blood sugar TID. E11.69  ? insulin aspart (NOVOLOG) 100 UNIT/ML injection Inject 2-10 Units into the skin 3 (three) times daily before meals. Per sliding scale  ? loratadine (CLARITIN) 10 MG tablet Take 1 tablet (10 mg total) by mouth daily.  ? metolazone (ZAROXOLYN) 2.5 MG tablet Take 1 tablet (2.5 mg total) by mouth every Monday, Wednesday, and Friday.  ? metoprolol succinate  (TOPROL XL) 25 MG 24 hr tablet Take 1.5 tablets (37.5 mg total) by mouth daily.  ? Multiple Vitamins-Minerals (MULTIVITAMIN WOMEN PO) Take 1 tablet by mouth daily.  ? nitroGLYCERIN (NITROSTAT) 0.4 MG SL tablet Place 1

## 2021-11-08 NOTE — Patient Instructions (Signed)
Medication Instructions:  ?Lidocaine (patch and ointment ordered) for pain ?I will reach out to Dr. Quentin Ore. For now, take 25 mg metoprolol twice a day. ? ?*If you need a refill on your cardiac medications before your next appointment, please call your pharmacy* ? ? ?Lab Work: ?None ordered today ? ? ?Testing/Procedures: ?None ordered today ? ? ?Follow-Up: ?At [facility], you and your health needs are our priority.  As part of our continuing mission to provide you with exceptional heart care, we have created designated Provider Care Teams.  These Care Teams include your primary Cardiologist (physician) and Advanced Practice Providers (APPs -  Physician Assistants and Nurse Practitioners) who all work together to provide you with the care you need, when you need it. ? ?We recommend signing up for the patient portal called "MyChart".  Sign up information is provided on this After Visit Summary.  MyChart is used to connect with patients for Virtual Visits (Telemedicine).  Patients are able to view lab/test results, encounter notes, upcoming appointments, etc.  Non-urgent messages can be sent to your provider as well.   ?To learn more about what you can do with MyChart, go to [email or web address].   ? ?Your next appointment:   ?1 month(s) ? ?The format for your next appointment:   ?In Person ? ?Provider:   ?Buford Dresser, MD  ? ? ? ?

## 2021-11-13 ENCOUNTER — Other Ambulatory Visit (HOSPITAL_COMMUNITY): Payer: Self-pay | Admitting: Adult Health

## 2021-11-14 ENCOUNTER — Other Ambulatory Visit (INDEPENDENT_AMBULATORY_CARE_PROVIDER_SITE_OTHER): Payer: Self-pay | Admitting: Primary Care

## 2021-11-14 ENCOUNTER — Telehealth (HOSPITAL_COMMUNITY): Payer: Self-pay

## 2021-11-14 MED ORDER — ALLOPURINOL 100 MG PO TABS: 100.0000 mg | ORAL_TABLET | Freq: Every day | ORAL | 3 refills | Status: DC

## 2021-11-14 NOTE — Telephone Encounter (Signed)
[name] called me in reference to needing her gout medication prescription  sent to her pharmacy. Colchicine 0.'3mg'$  daily. I will forward to PCP Sylvester Harder NP.  ? ?She also is requesting an appointment with Dr. [name] sooner than 4/26. I will reach out to HF staff.  ? ?Call complete.  ?

## 2021-11-15 ENCOUNTER — Telehealth (HOSPITAL_COMMUNITY): Payer: Self-pay

## 2021-11-15 NOTE — Telephone Encounter (Signed)
Spoke to [place] and made her aware that there are no sooner appointments with Dr. [name] before her scheduled 4/26 appointment and offered her a sooner visit with one of the APP's in clinic. She refused this offer and wants to wait to see Dr. [name] on 4/26.  ? ? ?She denied any further needs at this time.  ?I will consult with HF clinic staff about discharge from program at our meeting on Wednesday. Call complete.  ?

## 2021-11-16 ENCOUNTER — Ambulatory Visit (INDEPENDENT_AMBULATORY_CARE_PROVIDER_SITE_OTHER): Payer: 59 | Admitting: Podiatry

## 2021-11-16 ENCOUNTER — Telehealth (HOSPITAL_COMMUNITY): Payer: Self-pay

## 2021-11-16 ENCOUNTER — Encounter: Payer: Self-pay | Admitting: Podiatry

## 2021-11-16 DIAGNOSIS — E1142 Type 2 diabetes mellitus with diabetic polyneuropathy: Secondary | ICD-10-CM

## 2021-11-16 DIAGNOSIS — R600 Localized edema: Secondary | ICD-10-CM

## 2021-11-16 DIAGNOSIS — R634 Abnormal weight loss: Secondary | ICD-10-CM | POA: Insufficient documentation

## 2021-11-16 DIAGNOSIS — E119 Type 2 diabetes mellitus without complications: Secondary | ICD-10-CM

## 2021-11-16 DIAGNOSIS — M79674 Pain in right toe(s): Secondary | ICD-10-CM | POA: Diagnosis not present

## 2021-11-16 DIAGNOSIS — M79675 Pain in left toe(s): Secondary | ICD-10-CM

## 2021-11-16 DIAGNOSIS — R0781 Pleurodynia: Secondary | ICD-10-CM | POA: Insufficient documentation

## 2021-11-16 DIAGNOSIS — B351 Tinea unguium: Secondary | ICD-10-CM | POA: Diagnosis not present

## 2021-11-16 DIAGNOSIS — Z860100 Personal history of colon polyps, unspecified: Secondary | ICD-10-CM | POA: Insufficient documentation

## 2021-11-16 NOTE — Telephone Encounter (Signed)
Called to inform [name] of graduation from [employer]. She has been able to make and attend all appointments without assistance. She is aware of her medications and what she takes and how often she takes them. She fills her own pill box and calls in her own refills. She has no social concerns at present. Amy Clegg NP and [name] LCSW are all in agreement for graduation.  ? ?She understands to follow up with her appointment with Dr. [name] in April and to reach out to HF clinic if she has any further needs. Call complete.  ?

## 2021-11-20 NOTE — Addendum Note (Signed)
Addended by: Marzetta Board on: 11/20/2021 09:34 PM ? ? Modules accepted: Level of Service ? ?

## 2021-11-20 NOTE — Progress Notes (Addendum)
?Subjective:  ?Patient ID: [name], female    DOB: 1956/08/01,  MRN: [id number] ? ?[name] presents to clinic today for for annual diabetic foot examination, at risk foot care with history of diabetic neuropathy, and painful elongated mycotic toenails 1-5 bilaterally which are tender when wearing enclosed shoe gear. Pain is relieved with periodic professional debridement. ? ?Patient relates 10 year h/o diabetes. She denies any h/o foot wounds. She does experience tingling on occasion. ?. ?Blood glucose was 150 mg/dl on today. ? ?New problem(s): None.  ? ?PCP is [name], NP , and last visit was September 12, 2021. ? ?Past Medical History:  ?Diagnosis Date  ? Allergic rhinitis   ? Arthritis   ? Asthma   ? Chest pain 12/27/2017  ? Chronic diastolic CHF (congestive heart failure) ([place])   ? COPD (chronic obstructive pulmonary disease) ([place])   ? Depression   ? DM (diabetes mellitus) ([place])   ? DVT (deep vein thrombosis) in pregnancy   ? Dysrhythmia   ? atrial fibrilation  ? GERD (gastroesophageal reflux disease)   ? Headache(784.0)   ? HTN (hypertension)   ? Hyperlipidemia   ? Obesity   ? Pneumonia 04/2018  ? RIGHT LOBE  ? Shortness of breath   ? Sleep apnea   ? compliant with CPAP  ?  ?Past Surgical History:  ?Procedure Laterality Date  ? ABDOMINAL HYSTERECTOMY    ? BUBBLE STUDY  11/26/2020  ? Procedure: BUBBLE STUDY;  Surgeon: Buford Dresser, MD;  Location: [place];  Service: Cardiovascular;;  ? CARDIOVERSION N/A 05/06/2020  ? Procedure: CARDIOVERSION;  Surgeon: Buford Dresser, MD;  Location: [place];  Service: Cardiovascular;  Laterality: N/A;  ? CARDIOVERSION N/A 11/04/2020  ? Procedure: CARDIOVERSION;  Surgeon: Lelon Perla, MD;  Location: [place];  Service: Cardiovascular;  Laterality: N/A;  ? CARDIOVERSION N/A 02/01/2021  ? Procedure: CARDIOVERSION;  Surgeon: Jolaine Artist, MD;  Location: [place];  Service: Cardiovascular;  Laterality:  N/A;  ? CARDIOVERSION N/A 02/09/2021  ? Procedure: CARDIOVERSION;  Surgeon: Jolaine Artist, MD;  Location: [place];  Service: Cardiovascular;  Laterality: N/A;  ? CARDIOVERSION N/A 04/19/2021  ? Procedure: CARDIOVERSION;  Surgeon: Fay Records, MD;  Location: [facility] ENDOSCOPY;  Service: Cardiovascular;  Laterality: N/A;  ? CATARACT EXTRACTION, BILATERAL    ? CHOLECYSTECTOMY    ? COLONOSCOPY WITH PROPOFOL N/A 03/05/2015  ? Procedure: COLONOSCOPY WITH PROPOFOL;  Surgeon: Carol Ada, MD;  Location: [facility] ENDOSCOPY;  Service: Endoscopy;  Laterality: N/A;  ? DIAGNOSTIC LAPAROSCOPY    ? ingrown hallux Left   ? KNEE SURGERY    ? LEFT HEART CATH AND CORONARY ANGIOGRAPHY N/A 12/31/2017  ? Procedure: LEFT HEART CATH AND CORONARY ANGIOGRAPHY;  Surgeon: Charolette Forward, MD;  Location: [place] CV LAB;  Service: Cardiovascular;  Laterality: N/A;  ? MAZE N/A 12/29/2020  ? Procedure: MAZE;  Surgeon: [name], MD;  Location: [place];  Service: Open Heart Surgery;  Laterality: N/A;  ? MITRAL VALVE REPAIR N/A 12/29/2020  ? Procedure: MITRAL VALVE REPAIR USING CARBOMEDICS ANNULOFLEX [place] SIZE 30;  Surgeon: [name], MD;  Location: [place];  Service: Open Heart Surgery;  Laterality: N/A;  ? RIGHT HEART CATH N/A 12/22/2020  ? Procedure: RIGHT HEART CATH;  Surgeon: Larey Dresser, MD;  Location: [place] CV LAB;  Service: Cardiovascular;  Laterality: N/A;  ? RIGHT HEART CATH N/A 01/31/2021  ? Procedure: RIGHT HEART CATH;  Surgeon: Jolaine Artist, MD;  Location:  [place] INVASIVE CV LAB;  Service: Cardiovascular;  Laterality: N/A;  ? RIGHT HEART CATH N/A 07/29/2021  ? Procedure: RIGHT HEART CATH;  Surgeon: Jolaine Artist, MD;  Location: [place] CV LAB;  Service: Cardiovascular;  Laterality: N/A;  ? TEE WITHOUT CARDIOVERSION N/A 11/26/2020  ? Procedure: TRANSESOPHAGEAL ECHOCARDIOGRAM (TEE);  Surgeon: Buford Dresser, MD;  Location: [place];  Service: Cardiovascular;  Laterality: N/A;  ? TEE  WITHOUT CARDIOVERSION N/A 12/29/2020  ? Procedure: TRANSESOPHAGEAL ECHOCARDIOGRAM (TEE);  Surgeon: [name], MD;  Location: [place];  Service: Open Heart Surgery;  Laterality: N/A;  ? TRICUSPID VALVE REPLACEMENT N/A 12/29/2020  ? Procedure: 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 RING SIZE 34;  Surgeon: [name], MD;  Location: [place];  Service: Open Heart Surgery;  Laterality: N/A;  ?  ?Current Outpatient Medications on File Prior to Visit  ?Medication Sig Dispense Refill  ? acetaminophen (TYLENOL) 500 MG tablet Take 1,000 mg by mouth every 6 (six) hours as needed for mild pain.    ? albuterol (VENTOLIN HFA) 108 (90 Base) MCG/ACT inhaler INHALE 2 PUFFS BY MOUTH EVERY 6 HOURS AS NEEDED 6.7 each 1  ? Alcohol Swabs (B-D SINGLE USE SWABS REGULAR) PADS 1 each by Other route daily. 100 each 11  ? Alcohol Swabs PADS 1 application by Does not apply route in the morning, at noon, and at bedtime. 120 each 11  ? allopurinol (ZYLOPRIM) 100 MG tablet Take 1 tablet (100 mg total) by mouth daily. 30 tablet 3  ? amiodarone (PACERONE) 200 MG tablet Take 1 tablet (200 mg total) by mouth daily. (Patient taking differently: Take 200 mg by mouth 2 (two) times daily.) 90 tablet 3  ? apixaban (ELIQUIS) 5 MG TABS tablet Take 1 tablet (5 mg total) by mouth 2 (two) times daily. 180 tablet 3  ? benzonatate (TESSALON) 200 MG capsule Take 1 capsule (200 mg total) by mouth 2 (two) times daily as needed for cough. (Patient not taking: Reported on 11/08/2021) 20 capsule 0  ? blood glucose meter kit and supplies KIT Dispense based on patient and insurance preference. Use up to four times daily as directed. 1 each 0  ? blood glucose meter kit and supplies Dispense based on patient and insurance preference. Use up to four times daily as directed. (FOR ICD-10 E10.9, E11.9). 1 each 0  ? Blood Glucose Monitoring Suppl (ACCU-CHEK GUIDE ME) w/Device KIT Use as instructed to check blood sugar three times daily. E11.69 1 kit 0   ? busPIRone (BUSPAR) 5 MG tablet Take 1 tablet (5 mg total) by mouth 2 (two) times daily. 180 tablet 1  ? colchicine 0.6 MG tablet Take 0.3 mg by mouth daily.    ? docusate sodium (COLACE) 100 MG capsule Take 100 mg by mouth 2 (two) times daily.    ? Dulaglutide (TRULICITY) 1.5 PI/9.5JO SOPN Inject 1.5 mg into the skin once a week. 2 mL 2  ? DULoxetine (CYMBALTA) 60 MG capsule Take 1 capsule (60 mg total) by mouth daily. 90 capsule 3  ? EPINEPHrine 0.3 mg/0.3 mL IJ SOAJ injection Inject 0.3 mg into the muscle as needed for anaphylaxis. 1 each 1  ? fluticasone (FLONASE) 50 MCG/ACT nasal spray Place 2 sprays into both nostrils daily. 16 g 6  ? glucose blood (ACCU-CHEK GUIDE) test strip Use to check blood sugar TID. E11.69 100 each 3  ? guaiFENesin-dextromethorphan (ROBITUSSIN DM) 100-10 MG/5ML syrup Take 10 mLs by mouth every 4 (four) hours as  needed for cough. (Patient not taking: Reported on 11/08/2021) 236 mL 1  ? insulin aspart (NOVOLOG) 100 UNIT/ML injection Inject 2-10 Units into the skin 3 (three) times daily before meals. Per sliding scale    ? lidocaine (LIDODERM) 5 % Place 1 patch onto the skin daily. Remove & Discard patch within 12 hours or as directed by MD 30 patch 3  ? lidocaine (XYLOCAINE) 5 % ointment Apply 1 application. topically as needed for moderate pain. First choice is lidocaine patch, but if not covered/expensive, please fill ointment 35.44 g 3  ? loratadine (CLARITIN) 10 MG tablet Take 1 tablet (10 mg total) by mouth daily. 30 tablet 11  ? metolazone (ZAROXOLYN) 2.5 MG tablet Take 1 tablet (2.5 mg total) by mouth every Monday, Wednesday, and Friday. 10 tablet 0  ? metoprolol succinate (TOPROL XL) 25 MG 24 hr tablet Take 1.5 tablets (37.5 mg total) by mouth daily. 30 tablet 11  ? Multiple Vitamins-Minerals (MULTIVITAMIN WOMEN PO) Take 1 tablet by mouth daily.    ? nitroGLYCERIN (NITROSTAT) 0.4 MG SL tablet Place 1 tablet (0.4 mg total) under the tongue every 5 (five) minutes x 3 doses as  needed for chest pain. 25 tablet 12  ? ondansetron (ZOFRAN-ODT) 4 MG disintegrating tablet Take 1 tablet (4 mg total) by mouth every 8 (eight) hours as needed for nausea or vomiting. (Patient not taking: Reported

## 2021-11-21 ENCOUNTER — Emergency Department (HOSPITAL_COMMUNITY): Payer: 59

## 2021-11-21 ENCOUNTER — Inpatient Hospital Stay (HOSPITAL_COMMUNITY)
Admission: EM | Admit: 2021-11-21 | Discharge: 2021-12-07 | DRG: 286 | Disposition: A | Discharge status: Home Health | Payer: 59 | Attending: Internal Medicine | Admitting: Internal Medicine

## 2021-11-21 DIAGNOSIS — I130 Hypertensive heart and chronic kidney disease with heart failure and stage 1 through stage 4 chronic kidney disease, or unspecified chronic kidney disease: Secondary | ICD-10-CM | POA: Diagnosis not present

## 2021-11-21 DIAGNOSIS — Z91030 Bee allergy status: Secondary | ICD-10-CM

## 2021-11-21 DIAGNOSIS — N184 Chronic kidney disease, stage 4 (severe): Secondary | ICD-10-CM | POA: Diagnosis not present

## 2021-11-21 DIAGNOSIS — I272 Pulmonary hypertension, unspecified: Secondary | ICD-10-CM | POA: Diagnosis present

## 2021-11-21 DIAGNOSIS — H70002 Acute mastoiditis without complications, left ear: Secondary | ICD-10-CM | POA: Diagnosis not present

## 2021-11-21 DIAGNOSIS — F8081 Childhood onset fluency disorder: Secondary | ICD-10-CM | POA: Diagnosis present

## 2021-11-21 DIAGNOSIS — G251 Drug-induced tremor: Secondary | ICD-10-CM | POA: Diagnosis present

## 2021-11-21 DIAGNOSIS — I4819 Other persistent atrial fibrillation: Secondary | ICD-10-CM | POA: Diagnosis present

## 2021-11-21 DIAGNOSIS — J449 Chronic obstructive pulmonary disease, unspecified: Secondary | ICD-10-CM | POA: Diagnosis present

## 2021-11-21 DIAGNOSIS — R0602 Shortness of breath: Secondary | ICD-10-CM | POA: Diagnosis not present

## 2021-11-21 DIAGNOSIS — E876 Hypokalemia: Secondary | ICD-10-CM | POA: Diagnosis present

## 2021-11-21 DIAGNOSIS — I428 Other cardiomyopathies: Secondary | ICD-10-CM | POA: Diagnosis present

## 2021-11-21 DIAGNOSIS — R4182 Altered mental status, unspecified: Secondary | ICD-10-CM | POA: Diagnosis present

## 2021-11-21 DIAGNOSIS — E785 Hyperlipidemia, unspecified: Secondary | ICD-10-CM | POA: Diagnosis present

## 2021-11-21 DIAGNOSIS — N1832 Chronic kidney disease, stage 3b: Secondary | ICD-10-CM

## 2021-11-21 DIAGNOSIS — I480 Paroxysmal atrial fibrillation: Secondary | ICD-10-CM | POA: Diagnosis present

## 2021-11-21 DIAGNOSIS — I5082 Biventricular heart failure: Secondary | ICD-10-CM | POA: Diagnosis present

## 2021-11-21 DIAGNOSIS — K219 Gastro-esophageal reflux disease without esophagitis: Secondary | ICD-10-CM | POA: Diagnosis present

## 2021-11-21 DIAGNOSIS — Z6841 Body Mass Index (BMI) 40.0 and over, adult: Secondary | ICD-10-CM

## 2021-11-21 DIAGNOSIS — E1122 Type 2 diabetes mellitus with diabetic chronic kidney disease: Secondary | ICD-10-CM | POA: Diagnosis present

## 2021-11-21 DIAGNOSIS — E119 Type 2 diabetes mellitus without complications: Secondary | ICD-10-CM

## 2021-11-21 DIAGNOSIS — N189 Chronic kidney disease, unspecified: Secondary | ICD-10-CM | POA: Diagnosis present

## 2021-11-21 DIAGNOSIS — I2510 Atherosclerotic heart disease of native coronary artery without angina pectoris: Secondary | ICD-10-CM | POA: Diagnosis present

## 2021-11-21 DIAGNOSIS — Z20822 Contact with and (suspected) exposure to covid-19: Secondary | ICD-10-CM | POA: Diagnosis present

## 2021-11-21 DIAGNOSIS — Z91198 Patient's noncompliance with other medical treatment and regimen for other reason: Secondary | ICD-10-CM

## 2021-11-21 DIAGNOSIS — Z8701 Personal history of pneumonia (recurrent): Secondary | ICD-10-CM

## 2021-11-21 DIAGNOSIS — T502X5A Adverse effect of carbonic-anhydrase inhibitors, benzothiadiazides and other diuretics, initial encounter: Secondary | ICD-10-CM | POA: Diagnosis present

## 2021-11-21 DIAGNOSIS — Z8249 Family history of ischemic heart disease and other diseases of the circulatory system: Secondary | ICD-10-CM

## 2021-11-21 DIAGNOSIS — Z7901 Long term (current) use of anticoagulants: Secondary | ICD-10-CM

## 2021-11-21 DIAGNOSIS — G4733 Obstructive sleep apnea (adult) (pediatric): Secondary | ICD-10-CM | POA: Diagnosis present

## 2021-11-21 DIAGNOSIS — Z90710 Acquired absence of both cervix and uterus: Secondary | ICD-10-CM

## 2021-11-21 DIAGNOSIS — T462X5A Adverse effect of other antidysrhythmic drugs, initial encounter: Secondary | ICD-10-CM | POA: Diagnosis present

## 2021-11-21 DIAGNOSIS — G929 Unspecified toxic encephalopathy: Secondary | ICD-10-CM | POA: Diagnosis not present

## 2021-11-21 DIAGNOSIS — F419 Anxiety disorder, unspecified: Secondary | ICD-10-CM | POA: Diagnosis present

## 2021-11-21 DIAGNOSIS — I071 Rheumatic tricuspid insufficiency: Secondary | ICD-10-CM | POA: Diagnosis present

## 2021-11-21 DIAGNOSIS — I493 Ventricular premature depolarization: Secondary | ICD-10-CM | POA: Diagnosis not present

## 2021-11-21 DIAGNOSIS — T508X5A Adverse effect of diagnostic agents, initial encounter: Secondary | ICD-10-CM | POA: Diagnosis not present

## 2021-11-21 DIAGNOSIS — Z794 Long term (current) use of insulin: Secondary | ICD-10-CM

## 2021-11-21 DIAGNOSIS — Z7985 Long-term (current) use of injectable non-insulin antidiabetic drugs: Secondary | ICD-10-CM

## 2021-11-21 DIAGNOSIS — I5023 Acute on chronic systolic (congestive) heart failure: Secondary | ICD-10-CM | POA: Diagnosis present

## 2021-11-21 DIAGNOSIS — D649 Anemia, unspecified: Secondary | ICD-10-CM | POA: Diagnosis not present

## 2021-11-21 DIAGNOSIS — Z952 Presence of prosthetic heart valve: Secondary | ICD-10-CM

## 2021-11-21 DIAGNOSIS — N1411 Contrast-induced nephropathy: Secondary | ICD-10-CM | POA: Diagnosis not present

## 2021-11-21 DIAGNOSIS — N179 Acute kidney failure, unspecified: Secondary | ICD-10-CM | POA: Diagnosis not present

## 2021-11-21 DIAGNOSIS — F32A Depression, unspecified: Secondary | ICD-10-CM | POA: Diagnosis present

## 2021-11-21 DIAGNOSIS — E1169 Type 2 diabetes mellitus with other specified complication: Secondary | ICD-10-CM | POA: Diagnosis present

## 2021-11-21 DIAGNOSIS — J309 Allergic rhinitis, unspecified: Secondary | ICD-10-CM | POA: Diagnosis present

## 2021-11-21 DIAGNOSIS — Z79899 Other long term (current) drug therapy: Secondary | ICD-10-CM

## 2021-11-21 DIAGNOSIS — I509 Heart failure, unspecified: Secondary | ICD-10-CM

## 2021-11-21 DIAGNOSIS — M1990 Unspecified osteoarthritis, unspecified site: Secondary | ICD-10-CM | POA: Diagnosis present

## 2021-11-21 DIAGNOSIS — Z882 Allergy status to sulfonamides status: Secondary | ICD-10-CM

## 2021-11-21 DIAGNOSIS — T426X5A Adverse effect of other antiepileptic and sedative-hypnotic drugs, initial encounter: Secondary | ICD-10-CM | POA: Diagnosis present

## 2021-11-21 LAB — CBC WITH DIFFERENTIAL/PLATELET
Abs Immature Granulocytes: 0.02 10*3/uL (ref 0.00–0.07)
Basophils Absolute: 0 10*3/uL (ref 0.0–0.1)
Basophils Relative: 1 %
Eosinophils Absolute: 0.1 10*3/uL (ref 0.0–0.5)
Eosinophils Relative: 1 %
HCT: 33 % — ABNORMAL LOW (ref 36.0–46.0)
Hemoglobin: 10.3 g/dL — ABNORMAL LOW (ref 12.0–15.0)
Immature Granulocytes: 0 %
Lymphocytes Relative: 19 %
Lymphs Abs: 1.2 10*3/uL (ref 0.7–4.0)
MCH: 29.1 pg (ref 26.0–34.0)
MCHC: 31.2 g/dL (ref 30.0–36.0)
MCV: 93.2 fL (ref 80.0–100.0)
Monocytes Absolute: 1 10*3/uL (ref 0.1–1.0)
Monocytes Relative: 16 %
Neutro Abs: 4.1 10*3/uL (ref 1.7–7.7)
Neutrophils Relative %: 63 %
Platelets: 225 10*3/uL (ref 150–400)
RBC: 3.54 MIL/uL — ABNORMAL LOW (ref 3.87–5.11)
RDW: 15.5 % (ref 11.5–15.5)
WBC: 6.5 10*3/uL (ref 4.0–10.5)
nRBC: 0 % (ref 0.0–0.2)

## 2021-11-21 LAB — URINALYSIS, ROUTINE W REFLEX MICROSCOPIC
Bilirubin Urine: NEGATIVE
Glucose, UA: NEGATIVE mg/dL
Ketones, ur: NEGATIVE mg/dL
Leukocytes,Ua: NEGATIVE
Nitrite: NEGATIVE
Protein, ur: 30 mg/dL — AB
Specific Gravity, Urine: 1.008 (ref 1.005–1.030)
pH: 5 (ref 5.0–8.0)

## 2021-11-21 LAB — RESP PANEL BY RT-PCR (FLU A&B, COVID) ARPGX2
Influenza A by PCR: NEGATIVE
Influenza B by PCR: NEGATIVE
SARS Coronavirus 2 by RT PCR: NEGATIVE

## 2021-11-21 LAB — CBG MONITORING, ED
Glucose-Capillary: 119 mg/dL — ABNORMAL HIGH (ref 70–99)
Glucose-Capillary: 117 mg/dL — ABNORMAL HIGH (ref 70–99)

## 2021-11-21 LAB — RAPID URINE DRUG SCREEN, HOSP PERFORMED
Amphetamines: NOT DETECTED
Barbiturates: NOT DETECTED
Benzodiazepines: NOT DETECTED
Cocaine: NOT DETECTED
Opiates: NOT DETECTED
Tetrahydrocannabinol: NOT DETECTED

## 2021-11-21 LAB — I-STAT VENOUS BLOOD GAS, ED
Acid-Base Excess: 2 mmol/L (ref 0.0–2.0)
Bicarbonate: 25.7 mmol/L (ref 20.0–28.0)
Calcium, Ion: 1.11 mmol/L — ABNORMAL LOW (ref 1.15–1.40)
HCT: 30 % — ABNORMAL LOW (ref 36.0–46.0)
Hemoglobin: 10.2 g/dL — ABNORMAL LOW (ref 12.0–15.0)
O2 Saturation: 94 %
Potassium: 3.2 mmol/L — ABNORMAL LOW (ref 3.5–5.1)
Sodium: 142 mmol/L (ref 135–145)
TCO2: 27 mmol/L (ref 22–32)
pCO2, Ven: 34.2 mmHg — ABNORMAL LOW (ref 44–60)
pH, Ven: 7.485 — ABNORMAL HIGH (ref 7.25–7.43)
pO2, Ven: 66 mmHg — ABNORMAL HIGH (ref 32–45)

## 2021-11-21 LAB — LIPASE, BLOOD: Lipase: 22 U/L (ref 11–51)

## 2021-11-21 LAB — COMPREHENSIVE METABOLIC PANEL
ALT: 17 U/L (ref 0–44)
AST: 26 U/L (ref 15–41)
Albumin: 3.4 g/dL — ABNORMAL LOW (ref 3.5–5.0)
Alkaline Phosphatase: 154 U/L — ABNORMAL HIGH (ref 38–126)
Anion gap: 10 (ref 5–15)
BUN: 16 mg/dL (ref 8–23)
CO2: 24 mmol/L (ref 22–32)
Calcium: 9.2 mg/dL (ref 8.9–10.3)
Chloride: 105 mmol/L (ref 98–111)
Creatinine, Ser: 2.03 mg/dL — ABNORMAL HIGH (ref 0.44–1.00)
GFR, Estimated: 27 mL/min — ABNORMAL LOW (ref >60)
Glucose, Bld: 147 mg/dL — ABNORMAL HIGH (ref 70–99)
Potassium: 3.6 mmol/L (ref 3.5–5.1)
Sodium: 139 mmol/L (ref 135–145)
Total Bilirubin: 1.7 mg/dL — ABNORMAL HIGH (ref 0.3–1.2)
Total Protein: 7.8 g/dL (ref 6.5–8.1)

## 2021-11-21 LAB — BRAIN NATRIURETIC PEPTIDE: B Natriuretic Peptide: 534.8 pg/mL — ABNORMAL HIGH (ref 0.0–100.0)

## 2021-11-21 LAB — BILIRUBIN, DIRECT: Bilirubin, Direct: 0.6 mg/dL — ABNORMAL HIGH (ref 0.0–0.2)

## 2021-11-21 LAB — MAGNESIUM: Magnesium: 1.8 mg/dL (ref 1.7–2.4)

## 2021-11-21 LAB — D-DIMER, QUANTITATIVE: D-Dimer, Quant: <0.27 ug/mL-FEU (ref 0.00–0.50)

## 2021-11-21 LAB — TROPONIN I (HIGH SENSITIVITY)
Troponin I (High Sensitivity): 6 ng/L (ref <18)
Troponin I (High Sensitivity): 7 ng/L (ref <18)

## 2021-11-21 MED ORDER — METOPROLOL TARTRATE 5 MG/5ML IV SOLN
5.0000 mg | Freq: Once | INTRAVENOUS | Status: AC
  Administered 2021-11-21: 5 mg via INTRAVENOUS
  Filled 2021-11-21: qty 5

## 2021-11-21 MED ORDER — METOPROLOL SUCCINATE ER 25 MG PO TB24
37.5000 mg | ORAL_TABLET | Freq: Every day | ORAL | Status: DC
  Administered 2021-11-21 – 2021-11-24 (×4): 37.5 mg via ORAL
  Filled 2021-11-21 (×4): qty 2

## 2021-11-21 MED ORDER — ASPIRIN 81 MG PO CHEW: 324.0000 mg | CHEWABLE_TABLET | Freq: Once | ORAL | Status: DC

## 2021-11-21 MED ORDER — AMOXICILLIN-POT CLAVULANATE 875-125 MG PO TABS
1.0000 | ORAL_TABLET | Freq: Two times a day (BID) | ORAL | Status: AC
  Administered 2021-11-22 – 2021-11-28 (×14): 1 via ORAL
  Filled 2021-11-21 (×14): qty 1

## 2021-11-21 MED ORDER — FUROSEMIDE 10 MG/ML IJ SOLN
40.0000 mg | Freq: Once | INTRAMUSCULAR | Status: AC
  Administered 2021-11-21: 40 mg via INTRAVENOUS
  Filled 2021-11-21: qty 4

## 2021-11-21 MED ORDER — INSULIN ASPART 100 UNIT/ML IJ SOLN
0.0000 [IU] | INTRAMUSCULAR | Status: DC
  Administered 2021-11-22: 3 [IU] via SUBCUTANEOUS
  Administered 2021-11-22: 2 [IU] via SUBCUTANEOUS
  Administered 2021-11-22: 1 [IU] via SUBCUTANEOUS
  Administered 2021-11-22 – 2021-11-23 (×4): 2 [IU] via SUBCUTANEOUS
  Administered 2021-11-23 – 2021-11-24 (×2): 1 [IU] via SUBCUTANEOUS
  Administered 2021-11-24: 2 [IU] via SUBCUTANEOUS

## 2021-11-21 MED ORDER — FENTANYL CITRATE PF 50 MCG/ML IJ SOSY
50.0000 ug | PREFILLED_SYRINGE | Freq: Once | INTRAMUSCULAR | Status: AC
  Administered 2021-11-21: 50 ug via INTRAVENOUS
  Filled 2021-11-21: qty 1

## 2021-11-21 MED ORDER — NITROGLYCERIN 2 % TD OINT: 1.0000 [in_us] | TOPICAL_OINTMENT | Freq: Once | TRANSDERMAL | Status: DC

## 2021-11-21 NOTE — Assessment & Plan Note (Signed)
?       -   CHA2DS2 vas score 6: continue current anticoagulation with  Eliquis,  ?  ?       -  Rate control:  Currently controlled with  Toprolol,        - Rhythm control: Continue amiodarone ? ?

## 2021-11-21 NOTE — Assessment & Plan Note (Signed)
nutrition consult  As outpatient ? ?

## 2021-11-21 NOTE — Subjective & Objective (Signed)
Presented with shortness of breath and occasional chest pain for the past 3 days she has been having more swelling in her arms and legs for the past 2 weeks on EMS arrival blood pressure 160/100 CBG 166 satting 95% on room air but started on 2 L for comfort given 2 nitro and 324 of aspirin as well as Zofran ?Patient describes pleuritic chest pain substernal and occasional chest pressure ?Patient also notes that left side of her face has been swollen although there is no dental pain she had a little bit of chills associated down ?On arrival to ER noted to be in atrial fibrillation with runs of RVR was given 5 mg of IV metoprolol ?

## 2021-11-21 NOTE — Assessment & Plan Note (Signed)
-   Order Sensitive SSI  °  ° -  check TSH and HgA1C °  ° ° °

## 2021-11-21 NOTE — Assessment & Plan Note (Signed)
Treat with augmentin ?

## 2021-11-21 NOTE — Assessment & Plan Note (Signed)
-  chronic avoid nephrotoxic medications such as NSAIDs, Vanco Zosyn combo,  avoid hypotension, continue to follow renal function  

## 2021-11-21 NOTE — ED Provider Notes (Signed)
?[facility] ?Provider Note ? ? ?CSN: [id number] ?Arrival date & time: 11/21/21  1057 ? ?  ? ?History ? ?No chief complaint on file. ? ? ?[name] is a 66 y.o. female. ? ?HPI ? ?66 year old female with a history of DM 2, obesity, chronic atrial fibrillation on Eliquis, nonischemic cardiomyopathy with CHF with a last EF in December 2022 of 40 to 45%, DKA, COPD, prolonged QT interval, CKD stage IV, anxiety, depression, HTN, HLD, GERD who to the emergency department with intermittent chest pain and shortness of breath.  Patient states that she has had worsening fluid retention in her face, bilateral arms and legs for the last 2 weeks.  She endorses worsening shortness of breath and dyspnea on exertion.  She endorses pleuritic chest discomfort that is substernal.  Some occasional episodes of chest pressure.  On arrival, the patient was notably in atrial fibrillation, occasionally in RVR.  She was administered 5 mg of IV metoprolol.  She also endorses asymmetric left-sided facial swelling.  She denies any dental pain or trauma.  She has had some chills. ? ?Home Medications ?Prior to Admission medications   ?Medication Sig Start Date End Date Taking? Authorizing Provider  ?acetaminophen (TYLENOL) 500 MG tablet Take 1,000 mg by mouth every 6 (six) hours as needed for mild pain.    [provider]  ?albuterol (VENTOLIN HFA) 108 (90 Base) MCG/ACT inhaler INHALE 2 PUFFS BY MOUTH EVERY 6 HOURS AS NEEDED 10/06/21   [name], NP  ?Alcohol Swabs (B-D SINGLE USE SWABS REGULAR) PADS 1 each by Other route daily. 07/06/21   [name], NP  ?Alcohol Swabs PADS 1 application by Does not apply route in the morning, at noon, and at bedtime. 09/14/21   [name], NP  ?allopurinol (ZYLOPRIM) 100 MG tablet Take 1 tablet (100 mg total) by mouth daily. 11/14/21   [name], NP  ?amiodarone (PACERONE) 200 MG tablet Take 1 tablet (200 mg total) by  mouth daily. ?Patient taking differently: Take 200 mg by mouth 2 (two) times daily. 09/27/21   Vickie Epley, MD  ?apixaban (ELIQUIS) 5 MG TABS tablet Take 1 tablet (5 mg total) by mouth 2 (two) times daily. 05/16/21   Rafael Bihari, FNP  ?benzonatate (TESSALON) 200 MG capsule Take 1 capsule (200 mg total) by mouth 2 (two) times daily as needed for cough. ?Patient not taking: Reported on 11/08/2021 09/02/21   Aline August, MD  ?blood glucose meter kit and supplies KIT Dispense based on patient and insurance preference. Use up to four times daily as directed. 02/10/21   Patrecia Pour, MD  ?blood glucose meter kit and supplies Dispense based on patient and insurance preference. Use up to four times daily as directed. (FOR ICD-10 E10.9, E11.9). 07/06/21   [name], NP  ?Blood Glucose Monitoring Suppl (ACCU-CHEK GUIDE ME) w/Device KIT Use as instructed to check blood sugar three times daily. E11.69 04/21/20   Charlott Rakes, MD  ?busPIRone (BUSPAR) 5 MG tablet Take 1 tablet (5 mg total) by mouth 2 (two) times daily. 09/14/21   [name], NP  ?colchicine 0.6 MG tablet Take 0.3 mg by mouth daily.    [provider]  ?docusate sodium (COLACE) 100 MG capsule Take 100 mg by mouth 2 (two) times daily.    [provider]  ?Dulaglutide (TRULICITY) 1.5 JK/9.3OI SOPN Inject 1.5 mg into the skin once a week. 10/19/21 01/17/22  [name],  NP  ?DULoxetine (CYMBALTA) 60 MG capsule Take 1 capsule (60 mg total) by mouth daily. 09/14/21   [name], NP  ?EPINEPHrine 0.3 mg/0.3 mL IJ SOAJ injection Inject 0.3 mg into the muscle as needed for anaphylaxis. 06/25/20   [name], NP  ?fluticasone (FLONASE) 50 MCG/ACT nasal spray Place 2 sprays into both nostrils daily. 05/24/21   [name], NP  ?glucose blood (ACCU-CHEK GUIDE) test strip Use to check blood sugar TID. E11.69 07/13/21   Charlott Rakes, MD  ?guaiFENesin-dextromethorphan (ROBITUSSIN DM) 100-10 MG/5ML  syrup Take 10 mLs by mouth every 4 (four) hours as needed for cough. ?Patient not taking: Reported on 11/08/2021 09/02/21   Aline August, MD  ?insulin aspart (NOVOLOG) 100 UNIT/ML injection Inject 2-10 Units into the skin 3 (three) times daily before meals. Per sliding scale 09/02/21   Aline August, MD  ?lidocaine (LIDODERM) 5 % Place 1 patch onto the skin daily. Remove & Discard patch within 12 hours or as directed by MD 11/08/21   Buford Dresser, MD  ?lidocaine (XYLOCAINE) 5 % ointment Apply 1 application. topically as needed for moderate pain. First choice is lidocaine patch, but if not covered/expensive, please fill ointment 11/08/21   Buford Dresser, MD  ?loratadine (CLARITIN) 10 MG tablet Take 1 tablet (10 mg total) by mouth daily. 09/14/21   [name], NP  ?metolazone (ZAROXOLYN) 2.5 MG tablet Take 1 tablet (2.5 mg total) by mouth every Monday, Wednesday, and Friday. 09/07/21   Aline August, MD  ?metoprolol succinate (TOPROL XL) 25 MG 24 hr tablet Take 1.5 tablets (37.5 mg total) by mouth daily. 09/09/21 09/09/22  Bensimhon, Shaune Pascal, MD  ?Multiple Vitamins-Minerals (MULTIVITAMIN WOMEN PO) Take 1 tablet by mouth daily.    [provider]  ?nitroGLYCERIN (NITROSTAT) 0.4 MG SL tablet Place 1 tablet (0.4 mg total) under the tongue every 5 (five) minutes x 3 doses as needed for chest pain. 01/08/14   Charolette Forward, MD  ?ondansetron (ZOFRAN-ODT) 4 MG disintegrating tablet Take 1 tablet (4 mg total) by mouth every 8 (eight) hours as needed for nausea or vomiting. ?Patient not taking: Reported on 11/08/2021 10/17/21   Isla Pence, MD  ?oxyCODONE (ROXICODONE) 5 MG immediate release tablet Take 1 tablet (5 mg total) by mouth every 4 (four) hours as needed for severe pain. ?Patient not taking: Reported on 11/08/2021 09/23/21   Sherwood Gambler, MD  ?oxyCODONE-acetaminophen (PERCOCET/ROXICET) 5-325 MG tablet Take 1 tablet by mouth every 6 (six) hours as needed for severe pain. ?Patient not  taking: Reported on 11/08/2021 10/17/21   Isla Pence, MD  ?pantoprazole (PROTONIX) 40 MG tablet Take 1 tablet (40 mg total) by mouth daily. 09/14/21   [name], NP  ?potassium chloride SA (KLOR-CON M) 20 MEQ tablet Take 2 tablets (40 mEq total) by mouth daily. Take an additional 20 meq with every dose of METOLAZONE 09/02/21   Aline August, MD  ?RESTASIS 0.05 % ophthalmic emulsion Place 1 drop into both eyes 2 (two) times daily. 11/19/17   [provider]  ?rosuvastatin (CRESTOR) 10 MG tablet Take 1 tablet (10 mg total) by mouth every evening. 09/02/21   Aline August, MD  ?torsemide (DEMADEX) 20 MG tablet TAKE 5 TABLETS BY MOUTH DAILY. CAN TAKE AN ADDITIONAL 2 TABLETS AS NEEDED FOR INCREASED WEIGHT GAIN 11/14/21   Bensimhon, Shaune Pascal, MD  ?   ? ?Allergies    ?Bee pollen and Sulfa antibiotics   ? ?Review of Systems   ?Review of  Systems  ?Respiratory:  Positive for chest tightness and shortness of breath.   ?Cardiovascular:  Positive for chest pain.  ?All other systems reviewed and are negative. ? ?Physical Exam ?Updated Vital Signs ?BP (!) 166/105   Pulse (!) 119   Temp 97.8 ?F (36.6 ?C) (Oral)   Resp 19   Ht 5' 7" (1.702 m)   Wt 134.3 kg   SpO2 97%   BMI 46.36 kg/m?  ?Physical Exam ?Vitals and nursing note reviewed.  ?Constitutional:   ?   General: She is not in acute distress. ?   Appearance: She is well-developed. She is obese. She is not ill-appearing.  ?HENT:  ?   Head: Normocephalic and atraumatic.  ?   Jaw: Tenderness and swelling present.  ?   Comments: Asymetric left sided facial swelling with mild TTP ?Eyes:  ?   Conjunctiva/sclera: Conjunctivae normal.  ?Cardiovascular:  ?   Rate and Rhythm: Tachycardia present. Rhythm irregular.  ?   Heart sounds: No murmur heard. ?Pulmonary:  ?   Effort: Pulmonary effort is normal. No respiratory distress.  ?   Breath sounds: Normal breath sounds.  ?Abdominal:  ?   Palpations: Abdomen is soft.  ?   Tenderness: There is no abdominal tenderness.   ?Musculoskeletal:     ?   General: No swelling.  ?   Cervical back: Neck supple.  ?Skin: ?   General: Skin is warm and dry.  ?   Capillary Refill: Capillary refill takes less than 2 seconds.  ?Neurological:  ?

## 2021-11-21 NOTE — ED Triage Notes (Signed)
Pt from home by [facility] with complaints of SHOB and CP since Friday. Pt reports fluid retention in face, arms, and legs for last 2 weeks. Hx asthma, Diabetes. Ems ggave '4mg'$  zofran, 2 nitro, and pt took 324 of aspirin pta. 20 LAC, HR 110, BP 160/100. CBG 166. Pt 95% on RA but EMS applied 2L for comfort ?

## 2021-11-21 NOTE — Assessment & Plan Note (Signed)
-   Pt diagnosed with CHF based on presence of the following:  OA, rales on exam,  cardiomegaly, Pulmonary edema on CXR, and  bilateral leg edema, DOE, tachycardia  ,   ? With noted response to IV diuretic in ER ? ?admit on telemetry,  ?cycle cardiac enzymes,  6 - 7 ?obtain serial ECG  to evaluate for ischemia as a cause of heart failure ? monitor daily weight:  ?Filed Weights  ? 11/21/21 1110  ?Weight: 134.3 kg  ? ?Last BNP BNP (last 3 results) ?Recent Labs  ?  08/31/21 ?0114 09/23/21 ?1554 11/21/21 ?1122  ?BNP 132.7* 31.1 534.8*  ? ?  diurese with IV lasix and monitor orthostatics and creatinine to avoid over diuresis. ? Order echogram to evaluate EF and valves ? ACE/ARBi   Contraindicated  ?email  cardiology   ? ?

## 2021-11-21 NOTE — Assessment & Plan Note (Signed)
continue crestor

## 2021-11-21 NOTE — ED Provider Notes (Signed)
Care of patient assumed dialyzing at 3 PM.  This patient with history of CHF and CKD, presenting for 2 weeks of worsening swelling shortness of breath.  Does have some asymmetric swelling in her face.  BNP is elevated.  She did receive 40 of IV Lasix here in the ED 3 hours ago.  She had intermittent atrial fibrillation with RVR.  She is now rate controlled.  Currently awaiting imaging studies.  Will require reassessment. ?Physical Exam  ?BP (!) 152/90   Pulse 95   Temp 97.8 ?F (36.6 ?C) (Oral)   Resp 18   Ht '5\' 7"'$  (1.702 m)   Wt 134.3 kg   SpO2 100%   BMI 46.36 kg/m?  ? ?Physical Exam ?Vitals and nursing note reviewed.  ?Constitutional:   ?   General: She is not in acute distress. ?   Appearance: Normal appearance. She is well-developed. She is ill-appearing and diaphoretic. She is not toxic-appearing.  ?HENT:  ?   Head: Normocephalic and atraumatic.  ?   Right Ear: External ear normal.  ?   Left Ear: External ear normal.  ?   Nose: Nose normal.  ?Eyes:  ?   Extraocular Movements: Extraocular movements intact.  ?   Conjunctiva/sclera: Conjunctivae normal.  ?Cardiovascular:  ?   Rate and Rhythm: Normal rate. Rhythm irregular.  ?Pulmonary:  ?   Effort: Tachypnea present. No accessory muscle usage or respiratory distress.  ?Abdominal:  ?   Palpations: Abdomen is soft.  ?   Tenderness: There is no abdominal tenderness.  ?Musculoskeletal:     ?   General: No swelling.  ?   Cervical back: Normal range of motion and neck supple.  ?   Right lower leg: Edema present.  ?   Left lower leg: Edema present.  ?Skin: ?   General: Skin is warm.  ?   Capillary Refill: Capillary refill takes less than 2 seconds.  ?Neurological:  ?   General: No focal deficit present.  ?   Mental Status: She is alert and oriented to person, place, and time.  ?Psychiatric:     ?   Mood and Affect: Mood normal.     ?   Behavior: Behavior normal.     ?   Thought Content: Thought content normal.     ?   Judgment: Judgment normal.  ? ? ?Procedures   ?Procedures ? ?ED Course / MDM  ?  ?Medical Decision Making ?Amount and/or Complexity of Data Reviewed ?Labs: ordered. ?Radiology: ordered. ? ?Risk ?Prescription drug management. ?Decision regarding hospitalization. ? ? ?On assessment, patient resting in bed.  Her granddaughter is at bedside.  Since IV Lasix, she has had approximately 1 L of urine output.  She continues to have tachypnea and labored breathing.  She also endorses some chest pain.  Medications were provided for chest pain.  Per chart review, does appear that she has had documented chronic chest pain.  I did reach out to cardiologist on-call, Dr. Debara Pickett who has no recommendations at this time.  Given her continued increased work of breathing and tachypnea, patient to be admitted for continued IV diuresis.  Patient was admitted to hospitalist for further management. ? ? ? ? ?  ?Godfrey Pick, MD ?11/22/21 1329 ? ?

## 2021-11-21 NOTE — H&P (Signed)
? ? [name] ERX:[id number] DOB: 03/16/1956 DOA: 11/21/2021 ?  ?PCP: [name], NP   ?Outpatient Specialists:  ?CARDS:  Dr.Christpher  Dr. Micheline Maze ?  ? ?Patient arrived to ER on 11/21/21 at 1057 ?Referred by Attending Godfrey Pick, MD ? ? ?Patient coming from:   ? home Lives  With family ?  ? ?Chief Complaint:  shortness of breath ? ? ?HPI: ?[name] is a 66 y.o. female with medical history significant of  DM2, obesity, A.fib on Eliquis, CHF, EF 40-45% ?DKA, COPD CKD stage IV, sp mitral valve repair ?  ? ?Presented with   Cp and SOB ?Presented with shortness of breath and occasional chest pain for the past 3 days she has been having more swelling in her arms and legs for the past 2 weeks on EMS arrival blood pressure 160/100 CBG 166 satting 95% on room air but started on 2 L for comfort given 2 nitro and 324 of aspirin as well as Zofran ?Patient describes pleuritic chest pain substernal and occasional chest pressure ?Patient also notes that left side of her face has been swollen although there is no dental pain she had a little bit of chills associated down ?On arrival to ER noted to be in atrial fibrillation with runs of RVR was given 5 mg of IV metoprolol ? Reports she has been taking her Lasix ?She has chronic CP ? ?Does not smoke no EtOH ? Initial COVID TEST  ?NEGATIVE  ? ?Lab Results  ?Component Value Date  ? [place] NEGATIVE 11/21/2021  ? [place] NEGATIVE 10/17/2021  ? SARSCOV2NAA POSITIVE (A) 08/26/2021  ? [place] NEGATIVE 07/28/2021  ? ?  ?Regarding pertinent Chronic problems:   ? Hyperlipidemia -  on statins crestor ?Lipid Panel  ?   ?Component Value Date/Time  ? CHOL 158 05/03/2021 0603  ? CHOL 140 06/24/2020 1054  ? TRIG 83 05/03/2021 0603  ? HDL 50 05/03/2021 0603  ? HDL 46 06/24/2020 1054  ? CHOLHDL 3.2 05/03/2021 0603  ? VLDL 17 05/03/2021 0603  ? [place] 91 05/03/2021 0603  ? [place] 75 06/24/2020 1054  ? LABVLDL 19 06/24/2020 1054  ? ? ? HTN on toprol ? ?  chronic CHF  systolic/ biventricular- last [id number] ?EF 40-45% ? ?ON TORSEMIDE AND metalozone ? ?  CAD  - On   statin, betablocker,  ?               -  followed by cardiology ?               - last cardiac cath 2019 ? Prox LAD lesion is 10% stenosed. ?Dist LM to Ost LAD lesion is 10% stenosed. ?Ost Cx lesion is 15% stenosed. ?The left ventricular systolic function is normal. ?LV end diastolic pressure is normal. ? ? ?  DM 2 -  ?Lab Results  ?Component Value Date  ? HGBA1C 6.5 (H) 08/27/2021  ?novolog ?  ? ?  Morbid obesity-   ?BMI Readings from Last 1 Encounters:  ?11/21/21 46.36 kg/m?  ? ?   ? COPD - not   followed by pulmonology   ? ? OSA - noncompliant with CPAP on 2L at night ?  ? A. Fib -  - CHA2DS2 vas score  6   ?   ? current  on anticoagulation with  Eliquis,  ?  ?       -  Rate control:  Currently controlled with  Toprolol,    ?       -  Rhythm control: amiodarone,   ? CKD stage IV- baseline Cr  2.4 ?Estimated Creatinine Clearance: 39.6 mL/min (A) (by C-G formula based on SCr of 2.03 mg/dL (H)). ? ?Lab Results  ?Component Value Date  ? CREATININE 2.03 (H) 11/21/2021  ? CREATININE 2.23 (H) 10/17/2021  ? CREATININE 2.48 (H) 09/23/2021  ? ? Chronic anemia - baseline hg Hemoglobin & Hematocrit  ?Recent Labs  ?  09/23/21 ?1554 10/17/21 ?1037 11/21/21 ?1122  ?HGB 12.5 11.3* 10.3*  ? ?  ?While in ER:  ? ?Negative d.dimer normal troponin ?ER gave 40 IV and she made 1 litter ?  ?Ordered ?RUQ [place] - Status post cholecystectomy. No acute abnormality seen in the right ?upper quadrant of the abdomen. ? ?CT NEck   Nonspecific subcutaneous edema within the ventrolateral lower neck ?and imaged chest wall, bilaterally. ?  ?Nonspecific borderline enlarged lymph node within the left lower ?neck (level IV), measuring 10 mm in short axis. ? ?CXR - Previous mitral valve replacement and atrial clip. Cardiomegaly. ?Pulmonary venous hypertension. ?    ?CT chest - Mild mosaic ground-glass opacities suggest mild pulmonary edema ?or  atelectasis.Mitral and tricuspid valve ?repair. ? ? ?Following Medications were ordered in ER: ?Medications  ?metoprolol succinate (TOPROL-XL) 24 hr tablet 37.5 mg (37.5 mg Oral Given 11/21/21 1609)  ?furosemide (LASIX) injection 40 mg (40 mg Intravenous Given 11/21/21 1220)  ?metoprolol tartrate (LOPRESSOR) injection 5 mg (5 mg Intravenous Given 11/21/21 1404)  ?fentaNYL (SUBLIMAZE) injection 50 mcg (50 mcg Intravenous Given 11/21/21 1840)  ?  ?_______________________________________________________ ?ER Provider Called:  Cardiology   Dr. Debara Pickett ?They Recommend admit to medicine   ? Re consult as needed ?  ?ED Triage Vitals  ?Enc Vitals Group  ?   BP 11/21/21 1109 130/85  ?   Pulse Rate 11/21/21 1109 (!) 142  ?   Resp 11/21/21 1109 (!) 28  ?   Temp 11/21/21 1109 97.8 ?F (36.6 ?C)  ?   Temp Source 11/21/21 1109 Oral  ?   SpO2 11/21/21 1105 99 %  ?   Weight 11/21/21 1110 296 lb (134.3 kg)  ?   Height 11/21/21 1110 '5\' 7"'$  (1.702 m)  ?   Head Circumference --   ?   Peak Flow --   ?   Pain Score 11/21/21 1109 9  ?   Pain Loc --   ?   Pain Edu? --   ?   Excl. in [place]? --   ?NIOE(70)@    ? _________________________________________ ?Significant initial  Findings: ?Abnormal Labs Reviewed  ?COMPREHENSIVE METABOLIC PANEL - Abnormal; Notable for the following components:  ?    Result Value  ? Glucose, Bld 147 (*)   ? Creatinine, Ser 2.03 (*)   ? Albumin 3.4 (*)   ? Alkaline Phosphatase 154 (*)   ? Total Bilirubin 1.7 (*)   ? GFR, Estimated 27 (*)   ? All other components within normal limits  ?BRAIN NATRIURETIC PEPTIDE - Abnormal; Notable for the following components:  ? B Natriuretic Peptide 534.8 (*)   ? All other components within normal limits  ?CBC WITH DIFFERENTIAL/PLATELET - Abnormal; Notable for the following components:  ? RBC 3.54 (*)   ? Hemoglobin 10.3 (*)   ? HCT 33.0 (*)   ? All other components within normal limits  ?BILIRUBIN, DIRECT - Abnormal; Notable for the following components:  ? Bilirubin, Direct 0.6 (*)   ? All  other components within normal limits  ?CBG MONITORING, ED - Abnormal; Notable  for the following components:  ? Glucose-Capillary 119 (*)   ? All other components within normal limits  ? ?  ?_________________________ ?Troponin 7 ?ECG: Ordered ?Personally reviewed by me showing: ?HR : 105 ?Rhythm:Atrial fibrillation ?Ventricular premature complex ?Right axis deviation ?Low voltage, precordial leads ?Nonspecific T abnrm, anterolateral leads ?Prolonged QT interval ?QTC 565 ?  ?The recent clinical data is shown below. ?Vitals:  ? 11/21/21 1609 11/21/21 1730 11/21/21 1800 11/21/21 1830  ?BP: (!) 144/99 108/85 117/82 (!) 141/84  ?Pulse: 100 100 93 91  ?Resp: '17 17 18 18  '$ ?Temp:      ?TempSrc:      ?SpO2: 96% 96% 98% 100%  ?Weight:      ?Height:      ? ?WBC ? ?   ?Component Value Date/Time  ? WBC 6.5 11/21/2021 1122  ? LYMPHSABS 1.2 11/21/2021 1122  ? LYMPHSABS 2.6 10/07/2019 1337  ? MONOABS 1.0 11/21/2021 1122  ? EOSABS 0.1 11/21/2021 1122  ? EOSABS 0.2 10/07/2019 1337  ? BASOSABS 0.0 11/21/2021 1122  ? BASOSABS 0.0 10/07/2019 1337  ? ? ? UA   ordered ?   ?Results for orders placed or performed during the hospital encounter of 11/21/21  ?Resp Panel by RT-PCR (Flu A&B, Covid) Nasopharyngeal Swab     Status: None  ? Collection Time: 11/21/21  1:22 PM  ? Specimen: Nasopharyngeal Swab; Nasopharyngeal(NP) swabs in vial transport medium  ?Result Value Ref Range Status  ? SARS Coronavirus 2 by RT PCR NEGATIVE NEGATIVE Final  ?     ? Influenza A by PCR NEGATIVE NEGATIVE Final  ? Influenza B by PCR NEGATIVE NEGATIVE Final  ?     ? ?_______________________________________________ ?Hospitalist was called for admission for acute  ? ? ?The following Work up has been ordered so far: ? ?Orders Placed This Encounter  ?Procedures  ? Resp Panel by RT-PCR (Flu A&B, Covid) Nasopharyngeal Swab  ? DG Chest Portable 1 View  ? CT Chest Wo Contrast  ? CT Soft Tissue Neck Wo Contrast  ? US Abdomen Limited RUQ (LIVER/GB)  ? Comprehensive metabolic  panel  ? Magnesium  ? Lipase, blood  ? Brain natriuretic peptide (order if patient c/o SOB ONLY)  ? Rapid urine drug screen (hospital performed)  ? CBC with Differential/Platelet  ? D-dimer, quantitative  ? Bi

## 2021-11-22 ENCOUNTER — Observation Stay (HOSPITAL_COMMUNITY): Payer: 59

## 2021-11-22 ENCOUNTER — Other Ambulatory Visit: Payer: Self-pay

## 2021-11-22 ENCOUNTER — Encounter (HOSPITAL_COMMUNITY): Payer: Self-pay | Admitting: Internal Medicine

## 2021-11-22 DIAGNOSIS — I5023 Acute on chronic systolic (congestive) heart failure: Secondary | ICD-10-CM | POA: Diagnosis present

## 2021-11-22 DIAGNOSIS — N184 Chronic kidney disease, stage 4 (severe): Secondary | ICD-10-CM | POA: Diagnosis present

## 2021-11-22 DIAGNOSIS — I4819 Other persistent atrial fibrillation: Secondary | ICD-10-CM | POA: Diagnosis present

## 2021-11-22 DIAGNOSIS — D649 Anemia, unspecified: Secondary | ICD-10-CM | POA: Diagnosis not present

## 2021-11-22 DIAGNOSIS — I2510 Atherosclerotic heart disease of native coronary artery without angina pectoris: Secondary | ICD-10-CM | POA: Diagnosis present

## 2021-11-22 DIAGNOSIS — I5082 Biventricular heart failure: Secondary | ICD-10-CM | POA: Diagnosis present

## 2021-11-22 DIAGNOSIS — H70002 Acute mastoiditis without complications, left ear: Secondary | ICD-10-CM | POA: Diagnosis present

## 2021-11-22 DIAGNOSIS — Z20822 Contact with and (suspected) exposure to covid-19: Secondary | ICD-10-CM | POA: Diagnosis present

## 2021-11-22 DIAGNOSIS — I272 Pulmonary hypertension, unspecified: Secondary | ICD-10-CM | POA: Diagnosis present

## 2021-11-22 DIAGNOSIS — I509 Heart failure, unspecified: Secondary | ICD-10-CM

## 2021-11-22 DIAGNOSIS — N179 Acute kidney failure, unspecified: Secondary | ICD-10-CM | POA: Diagnosis not present

## 2021-11-22 DIAGNOSIS — I4891 Unspecified atrial fibrillation: Secondary | ICD-10-CM

## 2021-11-22 DIAGNOSIS — Z6841 Body Mass Index (BMI) 40.0 and over, adult: Secondary | ICD-10-CM | POA: Diagnosis not present

## 2021-11-22 DIAGNOSIS — I480 Paroxysmal atrial fibrillation: Secondary | ICD-10-CM | POA: Diagnosis not present

## 2021-11-22 DIAGNOSIS — E1122 Type 2 diabetes mellitus with diabetic chronic kidney disease: Secondary | ICD-10-CM | POA: Diagnosis present

## 2021-11-22 DIAGNOSIS — F32A Depression, unspecified: Secondary | ICD-10-CM | POA: Diagnosis present

## 2021-11-22 DIAGNOSIS — I130 Hypertensive heart and chronic kidney disease with heart failure and stage 1 through stage 4 chronic kidney disease, or unspecified chronic kidney disease: Secondary | ICD-10-CM | POA: Diagnosis present

## 2021-11-22 DIAGNOSIS — R0602 Shortness of breath: Secondary | ICD-10-CM | POA: Diagnosis present

## 2021-11-22 DIAGNOSIS — E1169 Type 2 diabetes mellitus with other specified complication: Secondary | ICD-10-CM | POA: Diagnosis present

## 2021-11-22 DIAGNOSIS — K219 Gastro-esophageal reflux disease without esophagitis: Secondary | ICD-10-CM | POA: Diagnosis present

## 2021-11-22 DIAGNOSIS — N189 Chronic kidney disease, unspecified: Secondary | ICD-10-CM | POA: Diagnosis not present

## 2021-11-22 DIAGNOSIS — I428 Other cardiomyopathies: Secondary | ICD-10-CM | POA: Diagnosis present

## 2021-11-22 DIAGNOSIS — Z952 Presence of prosthetic heart valve: Secondary | ICD-10-CM | POA: Diagnosis not present

## 2021-11-22 DIAGNOSIS — J449 Chronic obstructive pulmonary disease, unspecified: Secondary | ICD-10-CM | POA: Diagnosis present

## 2021-11-22 DIAGNOSIS — F419 Anxiety disorder, unspecified: Secondary | ICD-10-CM | POA: Diagnosis present

## 2021-11-22 DIAGNOSIS — I5021 Acute systolic (congestive) heart failure: Secondary | ICD-10-CM

## 2021-11-22 DIAGNOSIS — G929 Unspecified toxic encephalopathy: Secondary | ICD-10-CM | POA: Diagnosis not present

## 2021-11-22 DIAGNOSIS — G4733 Obstructive sleep apnea (adult) (pediatric): Secondary | ICD-10-CM | POA: Diagnosis present

## 2021-11-22 DIAGNOSIS — I110 Hypertensive heart disease with heart failure: Secondary | ICD-10-CM | POA: Diagnosis not present

## 2021-11-22 DIAGNOSIS — G251 Drug-induced tremor: Secondary | ICD-10-CM | POA: Diagnosis present

## 2021-11-22 LAB — CBC WITH DIFFERENTIAL/PLATELET
Abs Immature Granulocytes: 0.02 10*3/uL (ref 0.00–0.07)
Basophils Absolute: 0.1 10*3/uL (ref 0.0–0.1)
Basophils Relative: 1 %
Eosinophils Absolute: 0.2 10*3/uL (ref 0.0–0.5)
Eosinophils Relative: 3 %
HCT: 31.2 % — ABNORMAL LOW (ref 36.0–46.0)
Hemoglobin: 9.5 g/dL — ABNORMAL LOW (ref 12.0–15.0)
Immature Granulocytes: 0 %
Lymphocytes Relative: 25 %
Lymphs Abs: 1.8 10*3/uL (ref 0.7–4.0)
MCH: 28.4 pg (ref 26.0–34.0)
MCHC: 30.4 g/dL (ref 30.0–36.0)
MCV: 93.1 fL (ref 80.0–100.0)
Monocytes Absolute: 1.4 10*3/uL — ABNORMAL HIGH (ref 0.1–1.0)
Monocytes Relative: 20 %
Neutro Abs: 3.6 10*3/uL (ref 1.7–7.7)
Neutrophils Relative %: 51 %
Platelets: 232 10*3/uL (ref 150–400)
RBC: 3.35 MIL/uL — ABNORMAL LOW (ref 3.87–5.11)
RDW: 15.4 % (ref 11.5–15.5)
WBC: 7 10*3/uL (ref 4.0–10.5)
nRBC: 0 % (ref 0.0–0.2)

## 2021-11-22 LAB — COMPREHENSIVE METABOLIC PANEL
ALT: 15 U/L (ref 0–44)
AST: 19 U/L (ref 15–41)
Albumin: 3.2 g/dL — ABNORMAL LOW (ref 3.5–5.0)
Alkaline Phosphatase: 133 U/L — ABNORMAL HIGH (ref 38–126)
Anion gap: 10 (ref 5–15)
BUN: 18 mg/dL (ref 8–23)
CO2: 25 mmol/L (ref 22–32)
Calcium: 9.1 mg/dL (ref 8.9–10.3)
Chloride: 106 mmol/L (ref 98–111)
Creatinine, Ser: 2.09 mg/dL — ABNORMAL HIGH (ref 0.44–1.00)
GFR, Estimated: 26 mL/min — ABNORMAL LOW (ref >60)
Glucose, Bld: 125 mg/dL — ABNORMAL HIGH (ref 70–99)
Potassium: 3.5 mmol/L (ref 3.5–5.1)
Sodium: 141 mmol/L (ref 135–145)
Total Bilirubin: 1.1 mg/dL (ref 0.3–1.2)
Total Protein: 7.5 g/dL (ref 6.5–8.1)

## 2021-11-22 LAB — RETICULOCYTES
Immature Retic Fract: 22.7 % — ABNORMAL HIGH (ref 2.3–15.9)
RBC.: 3.25 MIL/uL — ABNORMAL LOW (ref 3.87–5.11)
Retic Count, Absolute: 107.3 10*3/uL (ref 19.0–186.0)
Retic Ct Pct: 3.3 % — ABNORMAL HIGH (ref 0.4–3.1)

## 2021-11-22 LAB — ECHOCARDIOGRAM COMPLETE
Area-P 1/2: 2.51 cm2
Calc EF: 38.8 %
Height: 67 in
MV VTI: 1.61 cm2
S' Lateral: 3.8 cm
Single Plane A2C EF: 39.6 %
Single Plane A4C EF: 36.7 %
Weight: 4736 oz

## 2021-11-22 LAB — IRON AND TIBC
Iron: 38 ug/dL (ref 28–170)
Saturation Ratios: 12 % (ref 10.4–31.8)
TIBC: 316 ug/dL (ref 250–450)
UIBC: 278 ug/dL

## 2021-11-22 LAB — HIV ANTIBODY (ROUTINE TESTING W REFLEX): HIV Screen 4th Generation wRfx: NONREACTIVE

## 2021-11-22 LAB — RESPIRATORY PANEL BY PCR

## 2021-11-22 LAB — GLUCOSE, CAPILLARY: Glucose-Capillary: 210 mg/dL — ABNORMAL HIGH (ref 70–99)

## 2021-11-22 LAB — CBG MONITORING, ED
Glucose-Capillary: 120 mg/dL — ABNORMAL HIGH (ref 70–99)
Glucose-Capillary: 131 mg/dL — ABNORMAL HIGH (ref 70–99)
Glucose-Capillary: 137 mg/dL — ABNORMAL HIGH (ref 70–99)
Glucose-Capillary: 157 mg/dL — ABNORMAL HIGH (ref 70–99)
Glucose-Capillary: 164 mg/dL — ABNORMAL HIGH (ref 70–99)

## 2021-11-22 LAB — PHOSPHORUS: Phosphorus: 3.4 mg/dL (ref 2.5–4.6)

## 2021-11-22 LAB — VITAMIN B12: Vitamin B-12: 645 pg/mL (ref 180–914)

## 2021-11-22 LAB — MAGNESIUM: Magnesium: 1.8 mg/dL (ref 1.7–2.4)

## 2021-11-22 LAB — FOLATE: Folate: 9.9 ng/mL (ref >5.9)

## 2021-11-22 LAB — FERRITIN: Ferritin: 42 ng/mL (ref 11–307)

## 2021-11-22 LAB — CK: Total CK: 51 U/L (ref 38–234)

## 2021-11-22 LAB — TSH: TSH: 2.224 u[IU]/mL (ref 0.350–4.500)

## 2021-11-22 MED ORDER — PANTOPRAZOLE SODIUM 40 MG PO TBEC
40.0000 mg | DELAYED_RELEASE_TABLET | Freq: Every day | ORAL | Status: DC
  Administered 2021-11-22 – 2021-12-07 (×16): 40 mg via ORAL
  Filled 2021-11-22 (×17): qty 1

## 2021-11-22 MED ORDER — AMIODARONE HCL IN DEXTROSE 360-4.14 MG/200ML-% IV SOLN
30.0000 mg/h | INTRAVENOUS | Status: DC
  Administered 2021-11-22 – 2021-11-26 (×7): 30 mg/h via INTRAVENOUS
  Filled 2021-11-22 (×8): qty 200

## 2021-11-22 MED ORDER — DIPHENHYDRAMINE HCL 25 MG PO CAPS
25.0000 mg | ORAL_CAPSULE | Freq: Four times a day (QID) | ORAL | Status: AC | PRN
  Administered 2021-11-22: 25 mg via ORAL
  Filled 2021-11-22: qty 1

## 2021-11-22 MED ORDER — BUSPIRONE HCL 5 MG PO TABS
5.0000 mg | ORAL_TABLET | Freq: Two times a day (BID) | ORAL | Status: DC
  Administered 2021-11-22 – 2021-12-07 (×32): 5 mg via ORAL
  Filled 2021-11-22 (×32): qty 1

## 2021-11-22 MED ORDER — MAGNESIUM SULFATE 2 GM/50ML IV SOLN
2.0000 g | Freq: Once | INTRAVENOUS | Status: AC
  Administered 2021-11-22: 2 g via INTRAVENOUS
  Filled 2021-11-22: qty 50

## 2021-11-22 MED ORDER — FUROSEMIDE 10 MG/ML IJ SOLN
80.0000 mg | Freq: Two times a day (BID) | INTRAMUSCULAR | Status: DC
  Administered 2021-11-22 – 2021-11-24 (×4): 80 mg via INTRAVENOUS
  Filled 2021-11-22 (×4): qty 8

## 2021-11-22 MED ORDER — ROSUVASTATIN CALCIUM 5 MG PO TABS
10.0000 mg | ORAL_TABLET | Freq: Every evening | ORAL | Status: DC
  Administered 2021-11-22 – 2021-12-06 (×15): 10 mg via ORAL
  Filled 2021-11-22 (×17): qty 2

## 2021-11-22 MED ORDER — FUROSEMIDE 10 MG/ML IJ SOLN
40.0000 mg | Freq: Two times a day (BID) | INTRAMUSCULAR | Status: DC
  Administered 2021-11-22: 40 mg via INTRAVENOUS
  Filled 2021-11-22: qty 4

## 2021-11-22 MED ORDER — SODIUM CHLORIDE 0.9% FLUSH
3.0000 mL | Freq: Two times a day (BID) | INTRAVENOUS | Status: DC
  Administered 2021-11-22 – 2021-12-06 (×8): 3 mL via INTRAVENOUS

## 2021-11-22 MED ORDER — COLCHICINE 0.6 MG PO TABS
0.3000 mg | ORAL_TABLET | Freq: Every day | ORAL | Status: DC
  Administered 2021-11-22: 0.3 mg via ORAL
  Filled 2021-11-22 (×2): qty 0.5

## 2021-11-22 MED ORDER — APIXABAN 5 MG PO TABS
5.0000 mg | ORAL_TABLET | Freq: Two times a day (BID) | ORAL | Status: DC
  Administered 2021-11-22 – 2021-12-07 (×32): 5 mg via ORAL
  Filled 2021-11-22 (×32): qty 1

## 2021-11-22 MED ORDER — SODIUM CHLORIDE 0.9% FLUSH: 3.0000 mL | INTRAVENOUS | Status: DC | PRN

## 2021-11-22 MED ORDER — POTASSIUM CHLORIDE CRYS ER 20 MEQ PO TBCR
40.0000 meq | EXTENDED_RELEASE_TABLET | ORAL | Status: AC
  Administered 2021-11-22 (×2): 40 meq via ORAL
  Filled 2021-11-22 (×2): qty 2

## 2021-11-22 MED ORDER — PERFLUTREN LIPID MICROSPHERE
1.0000 mL | INTRAVENOUS | Status: AC | PRN
  Administered 2021-11-22: 3 mL via INTRAVENOUS
  Filled 2021-11-22: qty 10

## 2021-11-22 MED ORDER — AMIODARONE HCL IN DEXTROSE 360-4.14 MG/200ML-% IV SOLN
60.0000 mg/h | INTRAVENOUS | Status: AC
Start: 2021-11-22 — End: 2021-11-22
  Administered 2021-11-22: 60 mg/h via INTRAVENOUS
  Filled 2021-11-22: qty 200

## 2021-11-22 MED ORDER — DULOXETINE HCL 60 MG PO CPEP
60.0000 mg | ORAL_CAPSULE | Freq: Every day | ORAL | Status: DC
  Administered 2021-11-22 – 2021-11-30 (×9): 60 mg via ORAL
  Filled 2021-11-22 (×9): qty 1

## 2021-11-22 MED ORDER — ALBUTEROL SULFATE (2.5 MG/3ML) 0.083% IN NEBU: 2.5000 mg | INHALATION_SOLUTION | RESPIRATORY_TRACT | Status: DC | PRN

## 2021-11-22 MED ORDER — SODIUM CHLORIDE 0.9 % IV SOLN: 250.0000 mL | INTRAVENOUS | Status: DC | PRN

## 2021-11-22 MED ORDER — ACETAMINOPHEN 650 MG RE SUPP
650.0000 mg | Freq: Four times a day (QID) | RECTAL | Status: DC | PRN
Start: 2021-11-22 — End: 2021-12-07

## 2021-11-22 MED ORDER — HYDROCODONE-ACETAMINOPHEN 5-325 MG PO TABS
1.0000 | ORAL_TABLET | ORAL | Status: DC | PRN
  Administered 2021-11-22 (×2): 2 via ORAL
  Administered 2021-11-22: 1 via ORAL
  Administered 2021-11-23 – 2021-11-24 (×3): 2 via ORAL
  Administered 2021-11-24: 1 via ORAL
  Filled 2021-11-22 (×2): qty 2
  Filled 2021-11-22: qty 1
  Filled 2021-11-22 (×5): qty 2

## 2021-11-22 MED ORDER — DIPHENHYDRAMINE-ZINC ACETATE 2-0.1 % EX CREA
TOPICAL_CREAM | Freq: Two times a day (BID) | CUTANEOUS | Status: DC | PRN
  Filled 2021-11-22: qty 28

## 2021-11-22 MED ORDER — LORATADINE 10 MG PO TABS
10.0000 mg | ORAL_TABLET | Freq: Every day | ORAL | Status: DC
  Administered 2021-11-22 – 2021-11-30 (×9): 10 mg via ORAL
  Filled 2021-11-22 (×9): qty 1

## 2021-11-22 MED ORDER — AMIODARONE HCL 200 MG PO TABS
200.0000 mg | ORAL_TABLET | Freq: Every day | ORAL | Status: DC
  Administered 2021-11-22: 200 mg via ORAL
  Filled 2021-11-22: qty 1

## 2021-11-22 MED ORDER — ACETAMINOPHEN 325 MG PO TABS
650.0000 mg | ORAL_TABLET | Freq: Four times a day (QID) | ORAL | Status: DC | PRN
  Administered 2021-11-29 – 2021-12-05 (×9): 650 mg via ORAL
  Filled 2021-11-22 (×9): qty 2

## 2021-11-22 NOTE — Progress Notes (Signed)
Heart Failure Navigator Progress Note ? ?Assessed for Heart & Vascular TOC clinic readiness.  ?Patient does not meet criteria due to prior to hospitalization pt established with AHF clinic, Dr. [name].  ? ?Navigator available for educational resources. ? ?Pricilla Holm, MSN, RN ?Heart Failure Nurse Navigator ?[phone] ? ? ?

## 2021-11-22 NOTE — Progress Notes (Signed)
?PROGRESS NOTE ? ? ? 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 ?-Frequent hospitalizations with CHF, A-fib, recent admission in January with chest pain and shortness of breath, diuresed with Lasix and transition to torsemide 60 twice daily with metolazone 3 days a week.  Presented to the ED 4/3 with chest pain, increased shortness of breath, weight gain. ?-CT chest noted mild pulmonary edema and small pleural effusions, creatinine was 2.1, BNP 534 ? ? ?Subjective: ?-Complains of chest discomfort and shortness of breath ? ?Assessment & Plan: ? ?Acute on chronic systolic and diastolic CHF ?Nonischemic cardiomyopathy ?-Multiple prior right heart caths, RHC 12/22 noted normal filling pressures moderately reduced cardiac output ?-Last echo 12/22 with EF of 40-45%, mildly decreased RV function ?-Continue IV Lasix, A-fib RVR could be contributing ?-Heart failure team consulted ?-Add SGLT2i if kidney function continues to improve ? ?A-fib RVR ?-Had maze procedure in May 22, multiple DCCVs in 2022 ?-A-fib RVR likely contributing to CHF exacerbation currently ?-Cardiology consulted, started on amiodarone drip today, ?-Continue Eliquis ? ?History of severe MR, severe TR ?-Mitral and tricuspid valve repair in 12/2018 ? ?CAD ?-Continue beta-blocker, statin, cath in 2019 with minimal nonobstructive CAD ? ?CKD3a ?-Baseline creatinine around 2, recently creatinine trended up to 2.6-3 range, improving since ?-Monitor with diuresis, ? ?Normocytic anemia ?-check anemia panel, mild iron deficiency in 2021 ? ?Type 2 diabetes mellitus ?-CBGs are stable, continue sliding scale insulin ? ?DVT prophylaxis: Apixaban ?Code Status: Full code 40 ?Family  Communication: d/w pt, no family at bedside ?Disposition Plan: Pending improvement in A-fib RVR, CHF symptoms ? ?Consultants:  ?Cardiology ? ?Procedures:  ? ?Antimicrobials:  ? ? ?Objective: ?Vitals:  ? 11/22/21 0815 11/22/21 0915 11/22/21 0930 11/22/21 1015  ?BP: 133/76 (!) 117/56 132/73 106/88  ?Pulse: (!) 107 96 (!) 112 91  ?Resp: (!) 21 (!) 23 20 (!) 22  ?Temp:      ?TempSrc:      ?SpO2: 98% 98% 100% 99%  ?Weight:      ?Height:      ? ?No intake or output data in the 24 hours ending 11/22/21 1122 ?Filed Weights  ? 11/21/21 1110  ?Weight: 134.3 kg  ? ? ?Examination: ? ?General exam: Obese chronically ill female, sitting up in bed, AAOx3, no distress ?HEENT: No JVD ?CVS: S1-S2, irregular rhythm ?Lungs: Decreased breath sounds the bases ?Abdomen: Soft, obese, nontender, bowel sounds present ?Extremities: trace edema ?Skin: No rashes ?Psychiatry: Mood & affect appropriate.  ? ? ? ?Data Reviewed:  ? ?CBC: ?Recent Labs  ?Lab 11/21/21 ?1122 11/21/21 ?2256 11/22/21 ?0244  ?WBC 6.5  --  7.0  ?NEUTROABS 4.1  --  3.6  ?HGB 10.3* 10.2* 9.5*  ?HCT 33.0* 30.0* 31.2*  ?MCV 93.2  --  93.1  ?PLT 225  --  232  ? ?Basic Metabolic Panel: ?Recent Labs  ?Lab 11/21/21 ?1122 11/21/21 ?2256 11/22/21 ?0244  ?NA 139 142 141  ?K 3.6 3.2* 3.5  ?CL 105  --  106  ?CO2 24  --  25  ?GLUCOSE 147*  --  125*  ?BUN 16  --  18  ?CREATININE 2.03*  --  2.09*  ?CALCIUM 9.2  --  9.1  ?MG 1.8  --  1.8  ?PHOS  --   --  3.4  ? ?  GFR: ?Estimated Creatinine Clearance: 38.4 mL/min (A) (by C-G formula based on SCr of 2.09 mg/dL (H)). ?Liver Function Tests: ?Recent Labs  ?Lab 11/21/21 ?1122 11/22/21 ?0244  ?AST 26 19  ?ALT 17 15  ?ALKPHOS 154* 133*  ?BILITOT 1.7* 1.1  ?PROT 7.8 7.5  ?ALBUMIN 3.4* 3.2*  ? ?Recent Labs  ?Lab 11/21/21 ?1122  ?LIPASE 22  ? ?No results for input(s): AMMONIA in the last 168 hours. ?Coagulation Profile: ?No results for input(s): INR, PROTIME in the last 168 hours. ?Cardiac Enzymes: ?Recent Labs  ?Lab 11/22/21 ?0244  ?CKTOTAL 51  ? ?BNP  (last 3 results) ?No results for input(s): PROBNP in the last 8760 hours. ?HbA1C: ?No results for input(s): HGBA1C in the last 72 hours. ?CBG: ?Recent Labs  ?Lab 11/21/21 ?1342 11/21/21 ?1944 11/22/21 ?2703 11/22/21 ?5009 11/22/21 ?3818  ?GLUCAP 119* 117* 131* 120* 137*  ? ?Lipid Profile: ?No results for input(s): CHOL, HDL, LDLCALC, TRIG, CHOLHDL, LDLDIRECT in the last 72 hours. ?Thyroid Function Tests: ?Recent Labs  ?  11/22/21 ?0244  ?TSH 2.224  ? ?Anemia Panel: ?No results for input(s): VITAMINB12, FOLATE, FERRITIN, TIBC, IRON, RETICCTPCT in the last 72 hours. ?Urine analysis: ?   ?Component Value Date/Time  ? [place] YELLOW 11/21/2021 1926  ? APPEARANCEUR CLEAR 11/21/2021 1926  ? LABSPEC 1.008 11/21/2021 1926  ? PHURINE 5.0 11/21/2021 1926  ? GLUCOSEU NEGATIVE 11/21/2021 1926  ? HGBUR SMALL (A) 11/21/2021 1926  ? [place] NEGATIVE 11/21/2021 1926  ? BILIRUBINUR negative 09/24/2019 1742  ? [place] NEGATIVE 11/21/2021 1926  ? PROTEINUR 30 (A) 11/21/2021 1926  ? UROBILINOGEN 1.0 09/24/2019 1742  ? UROBILINOGEN 1.0 10/26/2014 0249  ? NITRITE NEGATIVE 11/21/2021 1926  ? LEUKOCYTESUR NEGATIVE 11/21/2021 1926  ? ?Sepsis Labs: ?'@LABRCNTIP'$ (procalcitonin:4,lacticidven:4) ? ?) ?Recent Results (from the past 240 hour(s))  ?Resp Panel by RT-PCR (Flu A&B, Covid) Nasopharyngeal Swab     Status: None  ? Collection Time: 11/21/21  1:22 PM  ? Specimen: Nasopharyngeal Swab; Nasopharyngeal(NP) swabs in vial transport medium  ?Result Value Ref Range Status  ? SARS Coronavirus 2 by RT PCR NEGATIVE NEGATIVE Final  ?  Comment: (NOTE) ?SARS-CoV-2 target nucleic acids are NOT DETECTED. ? ?The SARS-CoV-2 RNA is generally detectable in upper respiratory ?specimens during the acute phase of infection. The lowest ?concentration of SARS-CoV-2 viral copies this assay can detect is ?138 copies/mL. A negative result does not preclude SARS-Cov-2 ?infection and should not be used as the sole basis for treatment or ?other patient  management decisions. A negative result may occur with  ?improper specimen collection/handling, submission of specimen other ?than nasopharyngeal swab, presence of viral mutation(s) within the ?areas targeted by this assay, and inadequate number of viral ?copies(<138 copies/mL). A negative result must be combined with ?clinical observations, patient history, and epidemiological ?information. The expected result is Negative. ? ?Fact Sheet for Patients:  ?EntrepreneurPulse.com.au ? ?Fact Sheet for Healthcare Providers:  ?IncredibleEmployment.be ? ?This test is no t yet approved or cleared by the [place] FDA and  ?has been authorized for detection and/or diagnosis of SARS-CoV-2 by ?FDA under an Emergency Use Authorization (EUA). This EUA will remain  ?in effect (meaning this test can be used) for the duration of the ?COVID-19 declaration under Section 564(b)(1) of the Act, 21 ?U.S.C.section 360bbb-3(b)(1), unless the authorization is terminated  ?or revoked sooner.  ? ? ?  ? Influenza A by PCR NEGATIVE NEGATIVE Final  ? Influenza B by PCR NEGATIVE NEGATIVE Final  ?  Comment: (NOTE) ?The Xpert Xpress SARS-CoV-2/FLU/RSV  plus assay is intended as an aid ?in the diagnosis of influenza from Nasopharyngeal swab specimens and ?should not be used as a sole basis for treatment. Nasal washings and ?aspirates are unacceptable for Xpert Xpress SARS-CoV-2/FLU/RSV ?testing. ? ?Fact Sheet for Patients: ?EntrepreneurPulse.com.au ? ?Fact Sheet for Healthcare Providers: ?IncredibleEmployment.be ? ?This test is not yet approved or cleared by the [place] FDA and ?has been authorized for detection and/or diagnosis of SARS-CoV-2 by ?FDA under an Emergency Use Authorization (EUA). This EUA will remain ?in effect (meaning this test can be used) for the duration of the ?COVID-19 declaration under Section 564(b)(1) of the Act, 21 U.S.C. ?section 360bbb-3(b)(1),  unless the authorization is terminated or ?revoked. ? ?Performed at [facility], [place] [address] [place], [place] ?[address] ?  ?Respiratory (~20 pathogens) panel by PCR     Status: None  ? Collection Time: 04/0

## 2021-11-22 NOTE — ED Notes (Signed)
Pt c/o itching after Echo gave IV definity, no swelling or sob. Admitting MD paged.   ?

## 2021-11-22 NOTE — Evaluation (Signed)
Occupational Therapy Evaluation ?Patient Details ?Name: [name] ?MRN: [id number] ?DOB: Jan 16, 1956 ?Today's Date: 11/22/2021 ? ? ?History of Present Illness This 66 y.o. female presenting on 4/3 with SOB and chest pain.  Found with acute on chronic systolic CHF.  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  ? ?Clinical Impression ?  ?PTA Patient reports independent using cane for mobility and ADLs, limited IADLs.  Admitted for above and presents with decreased activity tolerance, impaired balance and generalized weakness.  Pt on RA with Spo2 maintained, HR up to 120 with limited mobility in room.  She requires up to mod assist for LB ADLs, min assist +2 for mobility and transfers and would likely benefit from use of RW.  She was cueing for PLB during session, RR up to 34 during activity.  Pt will benefit from continued OT services while admitted and after dc at [facility] to optimize independence, tolerance and return to PLOF.  Will follow.   ? ?Recommendations for follow up therapy are one component of a multi-disciplinary discharge planning process, led by the attending physician.  Recommendations may be updated based on patient status, additional functional criteria and insurance authorization.  ? ?Follow Up Recommendations ? Home health OT  ?  ?Assistance Recommended at Discharge Frequent or constant Supervision/Assistance  ?Patient can return home with the following A little help with walking and/or transfers;A little help with bathing/dressing/bathroom;Assistance with cooking/housework;Direct supervision/assist for medications management;Direct supervision/assist for financial management;Help with stairs or ramp for entrance;Assist for transportation ? ?  ?Functional Status Assessment ? Patient has had a recent decline  in their functional status and demonstrates the ability to make significant improvements in function in a reasonable and predictable amount of time.  ?Equipment Recommendations ? None recommended by OT  ?  ?Recommendations for Other Services   ? ? ?  ?Precautions / Restrictions Precautions ?Precautions: Fall ?Precaution Comments: watch HR ?Restrictions ?Weight Bearing Restrictions: No  ? ?  ? ?Mobility Bed Mobility ?Overal bed mobility: Needs Assistance ?Bed Mobility: Supine to Sit, Sit to Supine ?  ?  ?Supine to sit: Min guard ?Sit to supine: Min guard ?  ?General bed mobility comments: for safety ?  ? ?Transfers ?  ?  ?  ?  ?  ?  ?  ?  ?  ?  ?  ? ?  ?Balance Overall balance assessment: Needs assistance ?Sitting-balance support: No upper extremity supported, Feet supported ?Sitting balance-Leahy Scale: Fair ?Sitting balance - Comments: limited dynamically ?  ?Standing balance support: Bilateral upper extremity supported, During functional activity ?Standing balance-Leahy Scale: Poor ?Standing balance comment: relies on BUE support ?  ?  ?  ?  ?  ?  ?  ?  ?  ?  ?  ?   ? ?ADL either performed or assessed with clinical judgement  ? ?ADL Overall ADL's : Needs assistance/impaired ?  ?  ?Grooming: Set up;Sitting ?  ?  ?  ?  ?  ?Upper Body Dressing : Sitting;Minimal assistance ?  ?Lower Body Dressing: Moderate assistance;Sit to/from stand ?Lower Body Dressing Details (indicate cue type and reason): assist for R sock, relies on BUE support in standing ?Toilet Transfer: Minimal assistance;+2 for safety/equipment;Ambulation ?Toilet Transfer Details (indicate cue type and reason): in room, bilateral hand held support ?  ?  ?  ?  ?Functional mobility  during ADLs: Minimal assistance;+2 for safety/equipment;Cueing for safety ?General ADL Comments: cueing for PLB during session, SOB and fatigues easily  ? ? ? ?Vision   ?   ?   ?Perception   ?  ?Praxis   ?  ? ?Pertinent Vitals/Pain Pain Assessment ?Pain Assessment: No/denies pain   ? ? ? ?Hand Dominance Right ?  ?Extremity/Trunk Assessment Upper Extremity Assessment ?Upper Extremity Assessment: Generalized weakness ?  ?Lower Extremity Assessment ?Lower Extremity Assessment: Defer to PT evaluation ?  ?Cervical / Trunk Assessment ?Cervical / Trunk Assessment: Other exceptions ?Cervical / Trunk Exceptions: obestiy ?  ?Communication Communication ?Communication: No difficulties ?  ?Cognition Arousal/Alertness: Awake/alert ?Behavior During Therapy: [facility] for tasks assessed/performed ?Overall Cognitive Status: Within Functional Limits for tasks assessed ?  ?  ?  ?  ?  ?  ?  ?  ?  ?  ?  ?  ?  ?  ?  ?  ?  ?  ?  ?General Comments  pt educated on PLB, fatigues easily and requires increased rest breaks.  SpO2 stable on RA, HR up to 120 with limited activity in room. ? ?  ?Exercises   ?  ?Shoulder Instructions    ? ? ?Home Living Family/patient expects to be discharged to:: Private residence ?Living Arrangements: Children;Other relatives ?Available Help at Discharge: Family;Available 24 hours/day ?Type of Home: Apartment ?Home Access: Stairs to enter ?Entrance Stairs-Number of Steps: 4 total (1 + 1 + 2 in) from parking lot, long sidewalk ?  ?Home Layout: One level ?  ?  ?Bathroom Shower/Tub: Tub/shower unit ?  ?Bathroom Toilet: Standard ?  ?  ?Home Equipment: Cane - single point;Rollator (4 wheels);BSC/3in1;Shower seat ?  ?Additional Comments: Lives with daughter and grandson; daughter works, but family able to assist without household tasks if needed ?  ? ?  ?Prior Functioning/Environment Prior Level of Function : Independent/Modified Independent ?  ?  ?  ?  ?  ?  ?Mobility Comments: Mod I using SPC, denies any recent falls. Does not drive ?ADLs Comments: Pt mod indep with bathing, sits/stands to shower. Daughter performs 51 of household tasks. Pt has RN coming out 1x/week for med mgmt and health mgmt. ?  ? ?  ?  ?OT Problem List: Decreased strength;Impaired balance (sitting and/or  standing);Decreased activity tolerance;Decreased knowledge of use of DME or AE;Decreased knowledge of precautions;Cardiopulmonary status limiting activity;Obesity ?  ?   ?OT Treatment/Interventions: Self-care/ADL training;Energy conservation;DME and/or AE instruction;Therapeutic activities;Patient/family education;Balance training  ?  ?OT Goals(Current goals can be found in the care plan section) Acute Rehab OT Goals ?Patient Stated Goal: get better ?OT Goal Formulation: With patient ?Time For Goal Achievement: 12/06/21 ?Potential to Achieve Goals: Good  ?OT Frequency: Min 2X/week ?  ? ?Co-evaluation   ?  ?  ?  ?  ? ?  ?AM-PAC OT "6 Clicks" Daily Activity     ?Outcome Measure Help from another person eating meals?: A Little ?Help from another person taking care of personal grooming?: A Little ?Help from another person toileting, which includes using toliet, bedpan, or urinal?: A Lot ?Help from another person bathing (including washing, rinsing, drying)?: A Lot ?Help from another person to put on and taking off regular upper body clothing?: A Little ?Help from another person to put on and taking off regular lower body clothing?: A Lot ?6 Click Score: 15 ?  ?End of Session Nurse Communication: Mobility status ? ?Activity Tolerance: Patient tolerated treatment well ?Patient left: in bed;with call bell/phone within  reach ? ?OT Visit Diagnosis: Other abnormalities of gait and mobility (R26.89);Muscle weakness (generalized) (M62.81)  ?              ?Time: 4103-0131 ?OT Time Calculation (min): 24 min ?Charges:  OT General Charges ?$OT Visit: 1 Visit ?OT Evaluation ?$OT Eval Moderate Complexity: 1 Mod ? ?Jolaine Artist, OT ?Acute Rehabilitation Services ?Pager [phone] ?Office [phone] ? ? ?Delight Stare ?11/22/2021, 11:04 AM ?

## 2021-11-22 NOTE — ED Notes (Signed)
SLP at BS 

## 2021-11-22 NOTE — Progress Notes (Signed)
?  Echocardiogram ?2D Echocardiogram has been performed. ? ?[name] ?11/22/2021, 8:55 AM ?

## 2021-11-22 NOTE — ED Notes (Signed)
Dr. Sung Amabile CHF team at [facility] ?

## 2021-11-22 NOTE — Plan of Care (Signed)

## 2021-11-22 NOTE — Evaluation (Signed)
Clinical/Bedside Swallow Evaluation ?Patient Details  ?Name: [name] ?MRN: [id number] ?Date of Birth: [birth year] ? ?Today's Date: 11/22/2021 ?Time: SLP Start Time (ACUTE ONLY): 1120 SLP Stop Time (ACUTE ONLY): 1135 ?SLP Time Calculation (min) (ACUTE ONLY): 15 min ? ?Past Medical History:  ?Past Medical History:  ?Diagnosis Date  ? Allergic rhinitis   ? Arthritis   ? Asthma   ? Chest pain 12/27/2017  ? Chronic diastolic CHF (congestive heart failure) ([place])   ? COPD (chronic obstructive pulmonary disease) ([place])   ? Depression   ? DM (diabetes mellitus) ([place])   ? DVT (deep vein thrombosis) in pregnancy   ? Dysrhythmia   ? atrial fibrilation  ? GERD (gastroesophageal reflux disease)   ? Headache(784.0)   ? HTN (hypertension)   ? Hyperlipidemia   ? Obesity   ? Pneumonia 04/2018  ? RIGHT LOBE  ? Shortness of breath   ? Sleep apnea   ? compliant with CPAP  ? ?Past Surgical History:  ?Past Surgical History:  ?Procedure Laterality Date  ? ABDOMINAL HYSTERECTOMY    ? BUBBLE STUDY  11/26/2020  ? Procedure: BUBBLE STUDY;  Surgeon: Buford Dresser, MD;  Location: [place];  Service: Cardiovascular;;  ? CARDIOVERSION N/A 05/06/2020  ? Procedure: CARDIOVERSION;  Surgeon: Buford Dresser, MD;  Location: [place];  Service: Cardiovascular;  Laterality: N/A;  ? CARDIOVERSION N/A 11/04/2020  ? Procedure: CARDIOVERSION;  Surgeon: Lelon Perla, MD;  Location: [place];  Service: Cardiovascular;  Laterality: N/A;  ? CARDIOVERSION N/A 02/01/2021  ? Procedure: CARDIOVERSION;  Surgeon: Jolaine Artist, MD;  Location: [place];  Service: Cardiovascular;  Laterality: N/A;  ? CARDIOVERSION N/A 02/09/2021  ? Procedure: CARDIOVERSION;  Surgeon: Jolaine Artist, MD;  Location: [place];  Service: Cardiovascular;  Laterality: N/A;  ? CARDIOVERSION N/A 04/19/2021  ? Procedure: CARDIOVERSION;  Surgeon: Fay Records, MD;  Location[facility];  Service: Cardiovascular;  Laterality: N/A;  ? CATARACT  EXTRACTION, BILATERAL    ? CHOLECYSTECTOMY    ? COLONOSCOPY WITH PROPOFOL N/A 03/05/2015  ? Procedure: COLONOSCOPY WITH PROPOFOL;  Surgeon: Carol Ada, MD;  Location: [facility] ENDOSCOPY;  Service: Endoscopy;  Laterality: N/A;  ? DIAGNOSTIC LAPAROSCOPY    ? ingrown hallux Left   ? KNEE SURGERY    ? LEFT HEART CATH AND CORONARY ANGIOGRAPHY N/A 12/31/2017  ? Procedure: LEFT HEART CATH AND CORONARY ANGIOGRAPHY;  Surgeon: Charolette Forward, MD;  Location: [place] CV LAB;  Service: Cardiovascular;  Laterality: N/A;  ? MAZE N/A 12/29/2020  ? Procedure: MAZE;  Surgeon: [name], MD;  Location: [place];  Service: Open Heart Surgery;  Laterality: N/A;  ? MITRAL VALVE REPAIR N/A 12/29/2020  ? Procedure: MITRAL VALVE REPAIR USING CARBOMEDICS ANNULOFLEX [place] SIZE 30;  Surgeon: [name], MD;  Location: [place];  Service: Open Heart Surgery;  Laterality: N/A;  ? RIGHT HEART CATH N/A 12/22/2020  ? Procedure: RIGHT HEART CATH;  Surgeon: Larey Dresser, MD;  Location: [place] CV LAB;  Service: Cardiovascular;  Laterality: N/A;  ? RIGHT HEART CATH N/A 01/31/2021  ? Procedure: RIGHT HEART CATH;  Surgeon: Jolaine Artist, MD;  Location: [place] CV LAB;  Service: Cardiovascular;  Laterality: N/A;  ? RIGHT HEART CATH N/A 07/29/2021  ? Procedure: RIGHT HEART CATH;  Surgeon: Jolaine Artist, MD;  Location: [place] CV [facility];  Service: Cardiovascular;  Laterality: N/A;  ? TEE WITHOUT CARDIOVERSION N/A 11/26/2020  ? Procedure: TRANSESOPHAGEAL ECHOCARDIOGRAM (TEE);  Surgeon: Buford Dresser, MD;  Location: [facility] ENDOSCOPY;  Service: Cardiovascular;  Laterality: N/A;  ? TEE WITHOUT CARDIOVERSION N/A 12/29/2020  ? Procedure: TRANSESOPHAGEAL ECHOCARDIOGRAM (TEE);  Surgeon: [name], MD;  Location: [place];  Service: Open Heart Surgery;  Laterality: N/A;  ? TRICUSPID VALVE REPLACEMENT N/A 12/29/2020  ? Procedure: 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 RING SIZE 34;  Surgeon: [name], MD;  Location: [place];  Service: Open Heart Surgery;  Laterality: N/A;  ? ?HPI:  ?Patient is a 66 y.o. female with PMH: DM-2, obesity, afib on Eliquis, COPD, CKD stage IV, s/p mitral valve repair. She presented to the hospital with SOB, occasional chest pain for past 3 days. On arrival of  EMS, BP of 160/100, CBG 166, satting at 95% but given 2L oxygen for comfort. In ER, found to be in atrial fibrillation with runs of RVR.  ?  ?Assessment / Plan / Recommendation  ?Clinical Impression ? Patient not currently presenting with clinical s/s of oral or pharyngeal phase dysphagia as per this bedside/clinical swallow evaluation but as per patient reports, she appears to be with esophageal phase dysphagia. She reported 3 month history of food feeling like it gets stuck in throat, feeling nauseated in AM, having instances of nausea and vomiting in PM, feeling bloated and having difficulty sleeping. She denied ever seeing her PCP for these symptoms. SLP is not recommending further treatment or assessment but is recommending patient f/u with PCP and may benefit from GI referral due to her complaints. ?SLP Visit Diagnosis: Dysphagia, unspecified (R13.10) ?   ?Aspiration Risk ? No limitations  ?  ?Diet Recommendation Regular;Thin liquid  ? ?Liquid Administration via: Cup;Straw ?Medication Administration: Whole meds with liquid ?Supervision: Patient able to self feed ?Postural Changes: Seated upright at 90 degrees;Remain upright for at least 30 minutes after po intake  ?  ?Other  Recommendations Recommended Consults: Consider GI evaluation ?Oral Care Recommendations: Oral care BID   ? ?Recommendations for follow up therapy are one component of a multi-disciplinary discharge planning process, led by the attending physician.  Recommendations may be updated based on patient status, additional functional criteria and insurance authorization. ? ?Follow up Recommendations No SLP follow up  ? ? ?  ?Assistance Recommended at Discharge  None  ?Functional Status Assessment Patient has had a recent decline in their functional status and demonstrates the ability to make significant improvements in function in a reasonable and predictable amount of time.  ?Frequency and Duration    ?  ?N/A  ?   ? ?Prognosis   N/A ? ?  ? ?Swallow Study   ?General Date of Onset: 11/22/21 ?HPI: Patient is a 66 y.o. female with PMH: DM-2, obesity, afib on Eliquis, COPD, CKD stage IV, s/p mitral valve repair. She presented to the hospital with SOB, occasional chest pain for past 3 days. On arrival of  EMS, BP of 160/100, CBG 166, satting at 95% but given 2L oxygen for comfort. In ER, found to be in atrial fibrillation with runs of RVR. ?Type of Study: Bedside Swallow Evaluation ?Previous Swallow Assessment: none found ?Diet Prior to this Study: Regular;Thin liquids ?Temperature Spikes Noted: No ?Respiratory Status: Room air ?History of Recent Intubation: No ?Behavior/Cognition: Alert;Cooperative;Pleasant mood ?Oral Cavity Assessment: Within Functional Limits ?Oral Care Completed by SLP: No ?Oral Cavity - Dentition: Adequate natural dentition ?Vision: Functional for self-feeding ?Self-Feeding Abilities: Able to feed self ?Patient Positioning: Upright in bed ?Baseline Vocal Quality: Normal ?Volitional Cough: Strong ?Volitional Swallow: Able to elicit  ?  ?  Oral/Motor/Sensory Function Overall Oral Motor/Sensory Function: Within functional limits   ?Ice Chips     ?Thin Liquid Thin Liquid: Within functional limits ?Presentation: Straw;Self Fed  ?  ?Nectar Thick     ?Honey Thick     ?Puree Puree: Not tested   ?Solid ? ? ?  Solid: Not tested  ? ?  ? ?Sonia Baller, MA, CCC-SLP ?Speech Therapy ? ? ? ? ? ?

## 2021-11-22 NOTE — Evaluation (Signed)
Physical Therapy Evaluation ?Patient Details ?Name: [name] ?MRN: [id number] ?DOB: 12/20/1955 ?Today's Date: 11/22/2021 ? ?History of Present Illness ? This 66 y.o. female presenting on 4/3 with SOB and chest pain.  Found with acute on chronic systolic CHF.  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  ?Clinical Impression ? Pt admitted secondary to problem above with deficits below. Pt requiring min guard to min A for transfers and short distance ambulation in the room. Pt with increased SOB, however oxygen sats WFL on RA. Recommending HHPT at d/c to address current deficits. Will continue to follow acutely.    ?   ? ?Recommendations for follow up therapy are one component of a multi-disciplinary discharge planning process, led by the attending physician.  Recommendations may be updated based on patient status, additional functional criteria and insurance authorization. ? ?Follow Up Recommendations Home health PT ? ?  ?Assistance Recommended at Discharge Frequent or constant Supervision/Assistance  ?Patient can return home with the following ? A little help with bathing/dressing/bathroom;Help with stairs or ramp for entrance;Assist for transportation;Assistance with cooking/housework ? ?  ?Equipment Recommendations None recommended by PT  ?Recommendations for Other Services ?    ?  ?Functional Status Assessment Patient has had a recent decline in their functional status and demonstrates the ability to make significant improvements in function in a reasonable and predictable amount of time.  ? ?  ?Precautions / Restrictions Precautions ?Precautions: Fall ?Precaution Comments: watch HR ?Restrictions ?Weight Bearing Restrictions: No  ? ?  ? ?Mobility ? Bed Mobility ?Overal bed mobility: Needs Assistance ?Bed Mobility: Supine to  Sit, Sit to Supine ?  ?  ?Supine to sit: Min guard ?Sit to supine: Min guard ?  ?General bed mobility comments: for safety ?  ? ?Transfers ?Overall transfer level: Needs assistance ?Equipment used: 2 person hand held assist ?Transfers: Sit to/from Stand ?Sit to Stand: Min guard ?  ?  ?  ?  ?  ?General transfer comment: Min guard for safety ?  ? ?Ambulation/Gait ?Ambulation/Gait assistance: Min assist, +2 safety/equipment ?Gait Distance (Feet): 10 Feet ?Assistive device: 2 person hand held assist ?Gait Pattern/deviations: Step-through pattern, Decreased stride length ?Gait velocity: Decreased ?  ?  ?General Gait Details: Min A for steadying assist to ambulate at EOB. Pt with SOB, however, oxygen sats WFL on RA. ? ?Stairs ?  ?  ?  ?  ?  ? ?Wheelchair Mobility ?  ? ?Modified Rankin (Stroke Patients Only) ?  ? ?  ? ?Balance Overall balance assessment: Needs assistance ?Sitting-balance support: No upper extremity supported, Feet supported ?Sitting balance-Leahy Scale: Fair ?Sitting balance - Comments: limited dynamically ?  ?Standing balance support: Bilateral upper extremity supported, During functional activity ?Standing balance-Leahy Scale: Poor ?Standing balance comment: relies on BUE support ?  ?  ?  ?  ?  ?  ?  ?  ?  ?  ?  ?   ? ? ? ?Pertinent Vitals/Pain    ? ? ?Home Living Family/patient expects to be discharged to:: Private residence ?Living Arrangements: Children;Other relatives ?Available Help at Discharge: Family;Available 24 hours/day ?Type of Home: Apartment ?Home Access: Stairs to enter ?  ?Entrance Stairs-Number of Steps: 4 total (1 + 1 + 2 in) from parking lot, long sidewalk ?  ?Home Layout: One level ?Home Equipment: Cane - single point;Rollator (4 wheels);BSC/3in1;Shower  seat ?Additional Comments: Lives with daughter and grandson; daughter works, but family able to assist without household tasks if needed  ?  ?Prior Function Prior Level of Function : Independent/Modified Independent ?  ?  ?  ?  ?  ?   ?Mobility Comments: Mod I using SPC, denies any recent falls. Does not drive ?ADLs Comments: Pt mod indep with bathing, sits/stands to shower. Daughter performs 18 of household tasks. Pt has RN coming out 1x/week for med mgmt and health mgmt. ?  ? ? ?Hand Dominance  ? Dominant Hand: Right ? ?  ?Extremity/Trunk Assessment  ? Upper Extremity Assessment ?Upper Extremity Assessment: Defer to OT evaluation ?  ? ?Lower Extremity Assessment ?Lower Extremity Assessment: Generalized weakness ?  ? ?Cervical / Trunk Assessment ?Cervical / Trunk Assessment: Other exceptions ?Cervical / Trunk Exceptions: obestiy  ?Communication  ? Communication: No difficulties  ?Cognition Arousal/Alertness: Awake/alert ?Behavior During Therapy: [facility] for tasks assessed/performed ?Overall Cognitive Status: Within Functional Limits for tasks assessed ?  ?  ?  ?  ?  ?  ?  ?  ?  ?  ?  ?  ?  ?  ?  ?  ?  ?  ?  ? ?  ?General Comments General comments (skin integrity, edema, etc.): pt educated on PLB, fatigues easily and requires increased rest breaks.  SpO2 stable on RA, HR up to 120 with limited activity in room. ? ?  ?Exercises    ? ?Assessment/Plan  ?  ?PT Assessment Patient needs continued PT services  ?PT Problem List Cardiopulmonary status limiting activity;Decreased strength;Decreased activity tolerance;Decreased balance;Decreased mobility ? ?   ?  ?PT Treatment Interventions DME instruction;Gait training;Functional mobility training;Therapeutic activities;Therapeutic exercise;Stair training;Patient/family education;Balance training   ? ?PT Goals (Current goals can be found in the Care Plan section)  ?Acute Rehab PT Goals ?Patient Stated Goal: to feel better ?PT Goal Formulation: With patient ?Time For Goal Achievement: 12/06/21 ?Potential to Achieve Goals: Good ? ?  ?Frequency Min 3X/week ?  ? ? ?Co-evaluation   ?  ?  ?  ?  ? ? ?  ?AM-PAC PT "6 Clicks" Mobility  ?Outcome Measure Help needed turning from your back to your side while in a  flat bed without using bedrails?: A Little ?Help needed moving from lying on your back to sitting on the side of a flat bed without using bedrails?: A Little ?Help needed moving to and from a bed to a chair (including a wheelchair)?: A Little ?Help needed standing up from a chair using your arms (e.g., wheelchair or bedside chair)?: A Little ?Help needed to walk in hospital room?: A Little ?Help needed climbing 3-5 steps with a railing? : A Lot ?6 Click Score: 17 ? ?  ?End of Session   ?Activity Tolerance: Patient limited by fatigue ?Patient left: in bed;with call bell/phone within reach (on stretcher in ED) ?Nurse Communication: Mobility status ?PT Visit Diagnosis: Other abnormalities of gait and mobility (R26.89);Unsteadiness on feet (R26.81);Muscle weakness (generalized) (M62.81) ?  ? ?Time: 7824-2353 ?PT Time Calculation (min) (ACUTE ONLY): 14 min ? ? ?Charges:   PT Evaluation ?$PT Eval Moderate Complexity: 1 Mod ?  ?  ?   ? ? ?Reuel Derby, PT, DPT  ?Acute Rehabilitation Services  ?Pager: [phone] ?Office: [phone] ? ? ?[place] ?11/22/2021, 12:55 PM ?

## 2021-11-22 NOTE — ED Notes (Signed)
Pt alert, NAD, calm, interactive. VSS. Echo in to see, at [facility].  ?

## 2021-11-22 NOTE — Consult Note (Signed)
?  ?Advanced Heart Failure Team Consult Note ? ? ?Primary Physician: [name], NP ?PCP-Cardiologist:  Buford Dresser, MD ? ?Reason for Consultation: A/C HFrEF ? ?HPI:   ? ?[name] is seen today for evaluation of heart failure at the request of Dr Broadus John.  ? ?66 y.o. female with history of morbid obesity, chronic diastolic and systolic HF with mildly reduced LVEF (45-50%), severe MR/TR, atrial fibrillation on Eliquis, HTN, and DMII.  ?  ?She has a known history of HF with mildly reduced LVEF of 45-50%, cath in 2019 with mild, non-obstructive CAD. Also with history of difficult to control Afib with failed Tikosyn, now on amiodarone. ?  ?Admitted 12/19/20 with worsening HF.  LHC 5/19 non-obstructive CAD. RHC 12/22/20 with elevated filling pressures and low CI.  ?  ?Underwent MV/TV repair with Maze on 12/30/20. Post-operatively, she struggled  with low output progressive renal failure and volume overload. Post operative Echo showed LVEF 55% RV moderately HK. Moderate TR. No significant MR. No effusion. Hemodynamics improved w/ milrinone. Suspected of having post-op ATN. She was discharged on 5/28. D/c wt was 281 lb. SCr was 2.8. She was also in Afib day of d/c.  ?  ?She was seen in ED 01/29/21 with SOB and CP, with decreased urinary output. She was admitted for A/CHF, diuresed with IV lasix, RHC showed low filling pressures and moderately reduced CO. She underwent successful DCCV 02/01/21. 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  ?  ?Seen in ED 02/07/21 for leg swelling & CP. She was back in Afib, rate controlled. She was loaded w amiodarone gtt, successful DCCV on 02/09/21. 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  ?  ?She was back in A fib and volume overloaded 02/22/21 at follow up. (She did not tell staff she was out of amio).  ?  ?She returned to HF clinic 2 days later and was in A fib RVR with rate in the  130s. She was sent to the ED for further evaluation. EP consulted. She converted to NSR on IV amiodarone. SCr trended up and diuretics held. Repeat echo showed new reduction in EF, in setting of AFL with RVR,  EF 30-35%, moderate LVH, moderate RV dysfunction. Transitioned to po amio and torsemide, discharge weight 271 lbs. ?  ?S/p DCCV 04/19/21, but back in afib at follow up a week later at [facility] appointment. Weight up 18 lbs, and more SOB. EP felt nothing else to offer her to maintain SR. Beta Blocker increased and amio 400 bid continued.  ?  ?Admitted 9/12-9/16/22 with CP and AKI on CKD IV. SCr 3.12 in ED, likely due to over diuresis. AHF consulted, diuretics held, and she received gentle IV hydration. She remained in AFL rate controlled.  CP felt to be non-cardiac in nature and possibly psychosomatic. SCr trended back to her baseline (2.9 day of d/c) her metolazone was stopped and home dose of HF medications were continued at discharge, weight 268 lbs. ?  ?Saw EP for f/u on 07/27/21. Back in NSR. Recommended to continue amio 200 BID X 1 week then reduce to 200 mg daily. AV nodal ablation/pacemaker to be considered if recurrent AF.  ? ?Admitted 08/26/21 with chest pain and increased shortness of breath. + Covid. EKG Afib RVR. Diuresed with IV lasix and transitioned to torsemide 60 mg twice a day + metolazone 2.5 mg M-W-F.  ? ?Presented to [facility] ED with chest  pain and increased shortness of breath. EKG A fib 105 bpm.She hasnt missed any doses of eliquis. Looks like she has been in A fib since January.  Respiratory panel negative. HS Trop 6>7. BNP 534, creatinine 2.1,  CT chest mild pulmonary edema and small pleural effusions. Given IV lasix this morning.   ? ?ECHO/Cath  ?RHC (5/22): elevated R>L filling pressures with PAPi low, suggestive of significant component of RV dysfunction, component of RV dysfunction, prominent v-waves in PCWP and RA tracings suggestive of MR/TR, CO low ?- RHC (6/22) with low filling  pressures with moderately reduced CO. ?- RHC 07/2021 normal filling pressures and moderately recued cardiac output.  ?- Echo (7/22) in setting of AFL with RVR, showed EF 30-35%, moderate LVH, moderate RV dysfunction, PASP 48, s/p MV repair with mild MR and mean gradient 10 mmHg with HR around 130, s/p TV repair with mild-moderate TR, dilated IVC.  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  ?-Echo 07/29/2021 EF 40-45% RV mildly reduced  ? ?Review of Systems: [y] = yes, [ ] = no  ? ?General: Weight gain [ ]; Weight loss [ ]; Anorexia [ ]; Fatigue [ Y]; Fever [ ]; Chills [ ]; Weakness [ ]  ?Cardiac: Chest pain/pressure [ ]; Resting SOB [ ]; Exertional SOB [Y ]; Orthopnea [ Y]; Pedal Edema [ Y]; Palpitations [ ]; Syncope [ ]; Presyncope [ ]; Paroxysmal nocturnal dyspnea[ ]  ?Pulmonary: Cough [ ]; Wheezing[ ]; Hemoptysis[ ]; Sputum [ ]; Snoring [ ]  ?GI: Vomiting[ ]; Dysphagia[ ]; Melena[ ]; Hematochezia [ ]; Heartburn[ ]; Abdominal pain [ ]; Constipation [ ]; Diarrhea [ ]; BRBPR [ ]  ?GU: Hematuria[ ]; Dysuria [ ]; Nocturia[ ]  ?Vascular: Pain in legs with walking [ ]; Pain in feet with lying flat [ ]; Non-healing sores [ ]; Stroke [ ]; TIA [ ]; Slurred speech [ ];  ?Neuro: Headaches[ ]; Vertigo[ ]; Seizures[ ]; Paresthesias[ ];Blurred vision [ ]; Diplopia [ ]; Vision changes [ ]  ?Ortho/Skin: Arthritis [ ]; Joint pain Jazmín.Cullens ]; Muscle pain [ ]; Joint swelling [ ]; Back Pain Jazmín.Cullens ]; Rash [ ]  ?Psych: Depression[ ]; Anxiety[ ]  ?Heme: Bleeding problems [ ]; Clotting disorders [ ]; Anemia [Y ]  ?Endocrine: Diabetes [Y ]; Thyroid dysfunction[ ] ? ?Home Medications ?Prior to Admission medications   ?Medication Sig Start Date End Date Taking? Authorizing Provider  ?acetaminophen (TYLENOL) 500 MG tablet Take 1,000 mg by mouth every 6 (six) hours as needed for mild pain.   Yes [provider]  ?albuterol (VENTOLIN HFA) 108 (90 Base) MCG/ACT inhaler INHALE 2 PUFFS BY MOUTH EVERY 6 HOURS AS NEEDED ?Patient taking differently:  Inhale 2 puffs into the lungs every 6 (six) hours as needed for wheezing or shortness of breath. 10/06/21  Yes [name], NP  ?amiodarone (PACERONE) 200 MG tablet Take 1 tablet (200 mg total) by mouth daily. 09/27/21  Yes Vickie Epley, MD  ?apixaban (ELIQUIS) 5 MG TABS tablet Take 1 tablet (5 mg total) by mouth 2 (two) times daily. 05/16/21  Yes Mil[place], Maricela Bo, FNP  ?busPIRone (BUSPAR) 5 MG tablet Take 1 tablet (5 mg total) by mouth 2 (two) times daily. 09/14/21  Yes [name], NP  ?colchicine 0.6 MG tablet Take 0.3 mg by mouth daily.   Yes [provider]  ?docusate sodium (COLACE) 100 MG capsule Take 100 mg by mouth 2 (two) times daily.   Yes [provider]  ?Dulaglutide (  TRULICITY) 1.5 BW/6.2MB SOPN Inject 1.5 mg into the skin once a week. ?Patient taking differently: Inject 1.5 mg into the skin every Wednesday. 10/19/21 01/17/22 Yes [name], NP  ?DULoxetine (CYMBALTA) 60 MG capsule Take 1 capsule (60 mg total) by mouth daily. 09/14/21  Yes [name], NP  ?EPINEPHrine 0.3 mg/0.3 mL IJ SOAJ injection Inject 0.3 mg into the muscle as needed for anaphylaxis. 06/25/20  Yes [name], NP  ?fluticasone (FLONASE) 50 MCG/ACT nasal spray Place 2 sprays into both nostrils daily. 05/24/21  Yes [name], NP  ?insulin aspart (NOVOLOG) 100 UNIT/ML injection Inject 2-10 Units into the skin 3 (three) times daily before meals. Per sliding scale 09/02/21  Yes Aline August, MD  ?lidocaine (XYLOCAINE) 5 % ointment Apply 1 application. topically as needed for moderate pain. First choice is lidocaine patch, but if not covered/expensive, please fill ointment ?Patient taking differently: Apply 1 application. topically 2 (two) times daily as needed for moderate pain. 11/08/21  Yes Buford Dresser, MD  ?loratadine (CLARITIN) 10 MG tablet Take 1 tablet (10 mg total) by mouth daily. 09/14/21  Yes [name], NP  ?metolazone (ZAROXOLYN) 2.5 MG  tablet Take 1 tablet (2.5 mg total) by mouth every Monday, Wednesday, and Friday. 09/07/21  Yes Aline August, MD  ?metoprolol succinate (TOPROL XL) 25 MG 24 hr tablet Take 1.5 tablets (37.5 mg total) by mouth dai

## 2021-11-22 NOTE — Progress Notes (Signed)
Inpatient Diabetes Program Recommendations ? ?AACE/ADA: New Consensus Statement on Inpatient Glycemic Control (2015) ? ?Target Ranges:  Prepandial:   less than 140 mg/dL ?     Peak postprandial:   less than 180 mg/dL (1-2 hours) ?     Critically ill patients:  140 - 180 mg/dL  ? ?Lab Results  ?Component Value Date  ? GLUCAP 164 (H) 11/22/2021  ? HGBA1C 6.5 (H) 08/27/2021  ? ? ?Review of Glycemic Control ? Latest Reference Range & Units 11/22/21 00:56 11/22/21 04:16 11/22/21 08:51 11/22/21 11:23  ?Glucose-Capillary 70 - 99 mg/dL 131 (H) 120 (H) 137 (H) 164 (H)  ?(H): Data is abnormally high ? ?Diabetes history: DM ?Outpatient Diabetes medications: Trulicity 1.5 mg every Wednesday, Novolog 2-10 units TID ?Current orders for Inpatient glycemic control: Novolog 0-9 units Q4H ? ?Referral received for DM evaluation.  Last A1C was 6.5% on 08/27/21.  Agree with current regimen.   ? ?Will continue to follow while inpatient. ? ?Thank you, ?Reche Dixon, MSN, RN ?Diabetes Coordinator ?Inpatient Diabetes Program ?[phone] (team pager from 8a-5p) ? ? ? ? ? ?

## 2021-11-23 DIAGNOSIS — H70002 Acute mastoiditis without complications, left ear: Secondary | ICD-10-CM | POA: Diagnosis not present

## 2021-11-23 DIAGNOSIS — I480 Paroxysmal atrial fibrillation: Secondary | ICD-10-CM

## 2021-11-23 DIAGNOSIS — I5023 Acute on chronic systolic (congestive) heart failure: Secondary | ICD-10-CM | POA: Diagnosis not present

## 2021-11-23 DIAGNOSIS — N184 Chronic kidney disease, stage 4 (severe): Secondary | ICD-10-CM

## 2021-11-23 DIAGNOSIS — E1169 Type 2 diabetes mellitus with other specified complication: Secondary | ICD-10-CM

## 2021-11-23 DIAGNOSIS — E785 Hyperlipidemia, unspecified: Secondary | ICD-10-CM

## 2021-11-23 LAB — BASIC METABOLIC PANEL
Anion gap: 9 (ref 5–15)
BUN: 24 mg/dL — ABNORMAL HIGH (ref 8–23)
CO2: 23 mmol/L (ref 22–32)
Calcium: 8.8 mg/dL — ABNORMAL LOW (ref 8.9–10.3)
Chloride: 105 mmol/L (ref 98–111)
Creatinine, Ser: 2.46 mg/dL — ABNORMAL HIGH (ref 0.44–1.00)
GFR, Estimated: 21 mL/min — ABNORMAL LOW (ref >60)
Glucose, Bld: 128 mg/dL — ABNORMAL HIGH (ref 70–99)
Potassium: 3.4 mmol/L — ABNORMAL LOW (ref 3.5–5.1)
Sodium: 137 mmol/L (ref 135–145)

## 2021-11-23 LAB — GLUCOSE, CAPILLARY
Glucose-Capillary: 132 mg/dL — ABNORMAL HIGH (ref 70–99)
Glucose-Capillary: 136 mg/dL — ABNORMAL HIGH (ref 70–99)
Glucose-Capillary: 152 mg/dL — ABNORMAL HIGH (ref 70–99)
Glucose-Capillary: 175 mg/dL — ABNORMAL HIGH (ref 70–99)
Glucose-Capillary: 194 mg/dL — ABNORMAL HIGH (ref 70–99)

## 2021-11-23 LAB — CBC
HCT: 31.3 % — ABNORMAL LOW (ref 36.0–46.0)
Hemoglobin: 9.8 g/dL — ABNORMAL LOW (ref 12.0–15.0)
MCH: 29.2 pg (ref 26.0–34.0)
MCHC: 31.3 g/dL (ref 30.0–36.0)
MCV: 93.2 fL (ref 80.0–100.0)
Platelets: 198 10*3/uL (ref 150–400)
RBC: 3.36 MIL/uL — ABNORMAL LOW (ref 3.87–5.11)
RDW: 15.5 % (ref 11.5–15.5)
WBC: 6.2 10*3/uL (ref 4.0–10.5)
nRBC: 0 % (ref 0.0–0.2)

## 2021-11-23 LAB — MAGNESIUM: Magnesium: 2.1 mg/dL (ref 1.7–2.4)

## 2021-11-23 LAB — HEMOGLOBIN A1C
Hgb A1c MFr Bld: 6.7 % — ABNORMAL HIGH (ref 4.8–5.6)
Mean Plasma Glucose: 146 mg/dL

## 2021-11-23 MED ORDER — ADULT MULTIVITAMIN W/MINERALS CH
1.0000 | ORAL_TABLET | Freq: Every day | ORAL | Status: DC
  Administered 2021-11-23 – 2021-12-07 (×15): 1 via ORAL
  Filled 2021-11-23 (×16): qty 1

## 2021-11-23 MED ORDER — COLCHICINE 0.3 MG HALF TABLET
0.3000 mg | ORAL_TABLET | Freq: Every day | ORAL | Status: DC
  Administered 2021-11-23 – 2021-12-07 (×15): 0.3 mg via ORAL
  Filled 2021-11-23 (×16): qty 1

## 2021-11-23 MED ORDER — PROSOURCE PLUS PO LIQD
30.0000 mL | Freq: Two times a day (BID) | ORAL | Status: DC
  Administered 2021-11-23 – 2021-11-24 (×3): 30 mL via ORAL
  Filled 2021-11-23 (×5): qty 30

## 2021-11-23 MED ORDER — ALUM & MAG HYDROXIDE-SIMETH 200-200-20 MG/5ML PO SUSP
30.0000 mL | Freq: Once | ORAL | Status: AC
  Administered 2021-11-24: 30 mL via ORAL
  Filled 2021-11-23: qty 30

## 2021-11-23 MED ORDER — POTASSIUM CHLORIDE CRYS ER 20 MEQ PO TBCR
40.0000 meq | EXTENDED_RELEASE_TABLET | Freq: Two times a day (BID) | ORAL | Status: DC
  Administered 2021-11-23 – 2021-11-24 (×2): 40 meq via ORAL
  Filled 2021-11-23 (×2): qty 2

## 2021-11-23 MED ORDER — POTASSIUM CHLORIDE CRYS ER 20 MEQ PO TBCR
40.0000 meq | EXTENDED_RELEASE_TABLET | Freq: Once | ORAL | Status: AC
  Administered 2021-11-23: 40 meq via ORAL
  Filled 2021-11-23: qty 2

## 2021-11-23 NOTE — Assessment & Plan Note (Addendum)
Patient underwent direct current cardioversion converting to sinus rhythm. ?Loaded with amiodarone and continue anticoagulation with apixaban.  ? ?Amiodarone was interrupted for encephalopathy suspected medications induced.  ?After improvement on encephalopathy, amiodarone was resumed with good toleration.  ?Plan to continue with 200 mg daily and follow up as outpatient.  ?Continue anticoagulation with apixaban. ?

## 2021-11-23 NOTE — Progress Notes (Signed)
?Progress Note ? ? ?PatientSri Gallagher JEH:[id number] DOB: 1956/02/09 DOA: 11/21/2021     1 ?DOS: the patient was seen and examined on 11/23/2021 ?  ?Brief hospital course: ?[name] was admitted to the hospital with the working diagnosis of decompensated heart failure. ? 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 Reported edema of arms and legs for 2 weeks, and dyspnea for 3 days into her hospitalization. Positive weight gain.  ?Frequent hospitalizations for heart failure, recent one on 08/2021, placed on daily torsemide and 3 time per week metolazone.  ? ?On her initial physical examination her blood pressure was 130/85, HR 142, RR 28, 02 saturation 99%, heart S1 and S2 present, irregularly irregular, tachycardic, lungs with no wheezing, but positive rales, abdomen soft and no lower extremity edema.  ? ?Na 139, K 3,6, CL 105, bicarbonate 24, glucose 147, BUN 16 and CR 2.0 ?BNP 534 ?Wbc 6.5 hgb 10,3 hct 33.0 plt 225  ?Sars covid 19 negative ? ?Chest radiograph with cardiomegaly, bilateral hilar vascular congestion, no infiltrates.  ? ?EKG 105 bpm, right axis deviation, Qtc 565, atrial fibrillation rhythm, with no significant ST segment changes, negative T wave lead I and AVL.  ? ?Patient placed on IV furosemide for diuresis.  ? ? ?Assessment and Plan: ?* Acute on chronic systolic CHF (congestive heart failure) ([place]) ?Echocardiogram with reduced LV systolic function 35 to 37% with global hypokinesis, interventricular septum is flattened and systole and diastole. Reduced RV systolic function, moderate. RVSP 46.9. sever dilatation of right and left atriums. Severe tricuspid valve regurgitation.  ? ?Pulmonary hypertension.  ? ?Urine output over last 24 hrs is 1000. ? ?Plan to continue medical therapy with furosemide.  ?Continue with metoprolol.  ? ? ?AF (paroxysmal  atrial fibrillation) ([place]) ?Continue rate control with metoprolol and anticoagulation with apixaban.  ?Amiodarone infusion per protocol.  ? ?CKD (chronic kidney disease) stage 4, GFR 15-29 ml/min (HCC) ?Hypokalemia. ?Renal function with serum cr at 2,46 with K t 3,4 and serum bicarbonate at 23.  ?Plan to continue diuresis with furosemide and follow up renal function in am. ?Continue with KCl supplementation.  ? ?Acute mastoiditis, left ?Continue antibiotic therapy with Augmentin  ? ?Class 3 obesity (HCC) ?Calculated BMI is 51,9  ? ?Type 2 diabetes mellitus with hyperlipidemia ([place]) ?Fasting glucose is 128,  ?Continue glucose cover and monitoring.  ?Patient is tolerating po well.  ? ?Continue with rosuvastatin.  ? ? ? ? ?  ? ?Subjective: patient is feeling very weak and deconditioned, continue to have edema and dyspnea  ? ?Physical Exam: ?Vitals:  ? 11/23/21 0452 11/23/21 0728 11/23/21 1053 11/23/21 1139  ?BP: 123/82 118/77  123/73  ?Pulse: 75 85  85  ?Resp: '14 20  18  '$ ?Temp: (!) 97.4 ?F (36.3 ?C)  98.3 ?F (36.8 ?C)   ?TempSrc: Oral Oral    ?SpO2: 98% 98%  98%  ?Weight: (!) 150.4 kg     ?Height:      ? ?Neurology awake and alert ?ENT with no pallor ?Cardiovascular with S1 and S2 present and rhythmic with no gallops or rubs, no murmurs ?Positive JVD ?Positive non pitting lower extremity edema ?Respiratory with no wheezing ?Abdomen protuberant ? ?Data Reviewed: ? ? ? ?Family Communication: no family at the bedside  ? ?Disposition: ?Status is: Inpatient ?Remains inpatient appropriate because: heart failure  ? Planned Discharge Destination: Home ? ? ? ?  Author: ?Tawni Millers, MD ?11/23/2021 6:11 PM ? ?For on call review [email or web address].  ?

## 2021-11-23 NOTE — Assessment & Plan Note (Signed)
Calculated BMI is 51,9  ?

## 2021-11-23 NOTE — Hospital Course (Addendum)
[name] was admitted to the hospital with the working diagnosis of decompensated heart failure. ? 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 Reported edema of arms and legs for 2 weeks, and dyspnea for 3 days into her hospitalization. Positive weight gain.  ?Frequent hospitalizations for heart failure, recent one on 08/2021, placed on daily torsemide and 3 time per week metolazone.  ? ?On her initial physical examination her blood pressure was 130/85, HR 142, RR 28, 02 saturation 99%, heart S1 and S2 present, irregularly irregular, tachycardic, lungs with no wheezing, but positive rales, abdomen soft and no lower extremity edema.  ? ?Na 139, K 3,6, CL 105, bicarbonate 24, glucose 147, BUN 16 and CR 2.0 ?BNP 534 ?Wbc 6.5 hgb 10,3 hct 33.0 plt 225  ?Sars covid 19 negative ? ?Chest radiograph with cardiomegaly, bilateral hilar vascular congestion, no infiltrates.  ? ?EKG 105 bpm, right axis deviation, Qtc 565, atrial fibrillation rhythm, with no significant ST segment changes, negative T wave lead I and AVL.  ? ?Patient placed on IV furosemide for diuresis.  ?04/06 biventricular failure with elevated pulmonary pressures and wedge pressure. ?Placed on inotropic support with milrinone and furosemide drip.  ?04/07 TEE cardioversion converting to sinus rhythm.  ? ?04/10 patient with acute altered mental status, slurring speech and stuttering.  ?CODE stroke was called and MRI was ordered.  ?CVA ruled out, possible medication induced neurologic side effects (amiodarone or gabapentin).  ?04/11 milrinone discontinued.  ?04/12 worsening renal failure, continue with stuttering and tremors.  ?04/13 neurologic deficits are improving.  ?04/14 renal function more stable but not back to baseline.  ? ?Continue diuresis with furosemide and metolazone with further improvement in renal  function. ?Patient will follow up as outpatient with cardiology.  ? ?

## 2021-11-23 NOTE — Progress Notes (Signed)
Initial Nutrition Assessment ? ?DOCUMENTATION CODES:  ? ?Morbid obesity ? ?INTERVENTION:  ?- 30 ml ProSource Plus BID, each supplement provides 100 kcals and 15 grams protein.  ?- MVI with minerals daily ?- Provide 2P and 8P snack to keep with pt's typical home diet pattern ? ?NUTRITION DIAGNOSIS:  ? ?Increased nutrient needs related to acute illness as evidenced by estimated needs. ? ?GOAL:  ? ?Patient will meet greater than or equal to 90% of their needs ? ?MONITOR:  ? ?PO intake, Supplement acceptance, Labs, Weight trends, I & O's ? ?REASON FOR ASSESSMENT:  ? ?Consult ?Assessment of nutrition requirement/status ? ?ASSESSMENT:  ? ?Pt admitted with SOB secondary to acute on chronic CHF. PMH significant for T2DM, obesity, afib on Eliquid, CHF, DKA, COPD and CKD stage IV. ? ?SLP performed bedside evaluation as pt reports 3 months of food feeling like it gets stuck in throat, feeling nauseated in the morning, n/v at night and bloating. Recommend pt continue with regular diet.  ? ?Pt reports that she has had a poor appetite for "a while." She states that since admission she has not eaten much but reports eating most of her breakfast today. PTA she typically has grits, cheese, eggs, [place] bacon, 1 piece of toast and/or fruit for breakfast. For lunch she makes homemade chicken or [place] salad with eggs, seasoning and mayo on toast. For dinner she may have chicken and a variety of vegetables. During the day, her daughter encourages her to have an afternoon snack and evening snack consisting of half a chicken salad sandwich and cheese with crackers and fruit. She is lactose intolerant which gives her diarrhea so she does not drink protein supplements at home.   ? ?Pt checks her blood sugar daily and reports that it has been running 125-150 which is much improved. She manages her diabetes with meal time insulin. ? ?Briefly discussed ways to decrease sodium intake in diet to help manage fluid status which include choosing  vegetables with no salt and rinsing canned vegetables, as well as the use of no salt seasoning. ? ?Meal completion: ?04/05: 85%-breakfast ? ?Pt states that she had some dry weight loss but then experienced increase in volume status. Reviewed weight history. Noted pt's weight +17.7 kg since 3/21 which could be attributed to volume status as pt with acute on chronic CHF. ? ?Documentation of non-pitting BUE and mild pitting facial and BLE edema present. ? ?Medications: lasix, SSI, protonix ? ?Labs: potassium 3.4, BUN 24, Cr 2.46, ionized calcium 1.11, GFR 21, HgbA1c 6.7%, CBG's: 132-210 x24 hours ? ?UOP: 1L x24 hours ?I/O's: -169m since admission ? ?NUTRITION - FOCUSED PHYSICAL EXAM: ? ?Flowsheet Row Most Recent Value  ?Orbital Region No depletion  ?Upper Arm Region No depletion  ?Thoracic and Lumbar Region No depletion  ?Buccal Region No depletion  ?Temple Region No depletion  ?Clavicle Bone Region No depletion  ?Clavicle and Acromion Bone Region No depletion  ?Scapular Bone Region No depletion  ?Dorsal Hand No depletion  ?Patellar Region No depletion  ?Anterior Thigh Region No depletion  ?Posterior Calf Region No depletion  ?Edema (RD Assessment) Mild  ?Hair Reviewed  ?Eyes Reviewed  ?Mouth Reviewed  ?Skin Reviewed  ?Nails Reviewed  ? ?  ? ? ?Diet Order:   ?Diet Order   ? ?       ?  Diet Carb Modified Fluid consistency: Thin; Room service appropriate? Yes  Diet effective now       ?  ? ?  ?  ? ?  ? ? ?  EDUCATION NEEDS:  ? ?Education needs have been addressed ? ?Skin:  Skin Assessment: Reviewed RN Assessment ? ?Last BM:  4/4 ? ?Height:  ? ?Ht Readings from Last 1 Encounters:  ?11/22/21 '5\' 7"'$  (1.702 m)  ? ? ?Weight:  ? ?Wt Readings from Last 1 Encounters:  ?11/23/21 (!) 150.4 kg  ? ? ?Ideal Body Weight:  61.4 kg ? ?BMI:  Body mass index is 51.93 kg/m?. ? ?Estimated Nutritional Needs:  ? ?Kcal:  1800-2000 ? ?Protein:  90-105g ? ?Fluid:  >/=1.8L ? ?Clayborne Dana, RDN, LDN ?Clinical Nutrition ?

## 2021-11-23 NOTE — Progress Notes (Signed)
Physical Therapy Treatment ?Patient Details ?Name: [name] ?MRN: [id number] ?DOB: April 16, 1956 ?Today's Date: 11/23/2021 ? ? ?History of Present Illness This 66 y.o. female presenting on 4/3 with SOB and chest pain.  Found with acute on chronic systolic CHF.  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 ? ?  ?PT Comments  ? ? Pt received in supine, agreeable to therapy session with encouragement and with good participation and improved tolerance for gait and transfer training using rollator. Pt making progress toward mobility goals, needing up to min guard with rollator for short household distance gait task and needing seated break on rollator prior to return to bed. VSS on RA. Pt continues to benefit from PT services to progress toward functional mobility goals.   ?Recommendations for follow up therapy are one component of a multi-disciplinary discharge planning process, led by the attending physician.  Recommendations may be updated based on patient status, additional functional criteria and insurance authorization. ? ?Follow Up Recommendations ? Home health PT ?  ?  ?Assistance Recommended at Discharge Frequent or constant Supervision/Assistance  ?Patient can return home with the following A little help with bathing/dressing/bathroom;Help with stairs or ramp for entrance;Assist for transportation;Assistance with cooking/housework;A little help with walking and/or transfers ?  ?Equipment Recommendations ? None recommended by PT  ?  ?Recommendations for Other Services   ? ? ?  ?Precautions / Restrictions Precautions ?Precautions: Fall ?Precaution Comments: watch HR ?Restrictions ?Weight Bearing Restrictions: No  ?  ? ?Mobility ? Bed Mobility ?Overal bed mobility: Needs Assistance ?Bed Mobility: Supine to Sit, Sit to Sidelying,  Rolling ?Rolling: Supervision ?  ?Supine to sit: Min guard ?  ?Sit to sidelying: Min guard ?General bed mobility comments: for safety and line mgmt ?  ? ?Transfers ?Overall transfer level: Needs assistance ?Equipment used: Rollator (4 wheels) ?Transfers: Sit to/from Stand ?Sit to Stand: Min guard ?  ?  ?  ?  ?  ?General transfer comment: Min guard for safety, rollator brake not working on one side so PTA holding onto device for stability with foot blocking wheel; after tx session, rollator tagged at front desk for repair ?  ? ?Ambulation/Gait ?Ambulation/Gait assistance: Min guard ?Gait Distance (Feet): 45 Feet (34f, seated break, 173f ?Assistive device: Rollator (4 wheels) ?Gait Pattern/deviations: Step-through pattern, Decreased stride length, Trunk flexed ?Gait velocity: Decreased ?  ?  ?General Gait Details: Pt reliant on BUE support of rollator; cues for activity pacing. Pt with DOE 2/4 however, oxygen sats WFL on RA. seated break in hallway with fatigue ? ? ? ? ?  ?Balance Overall balance assessment: Needs assistance ?Sitting-balance support: No upper extremity supported, Feet supported ?Sitting balance-Leahy Scale: Fair ?  ?  ?Standing balance support: Bilateral upper extremity supported, During functional activity ?Standing balance-Leahy Scale: Poor ?Standing balance comment: relies on BUE support, min guard for safety while turning around to sit on rollator seat ?  ?  ?  ?  ?  ?  ?  ?  ?  ?  ?  ?  ? ?  ?Cognition Arousal/Alertness: Awake/alert ?Behavior During Therapy: [facility] tasks assessed/performed ?Overall Cognitive Status: Within Functional Limits for tasks assessed ?  ?   ?  ?  ?General Comments: pt likes to have a heads-up on the plan prior to initiating mobility; cooperative ?  ?  ? ?  ?  Exercises Other Exercises ?Other Exercises: supine BLE AROM: ankle pumps, quad sets x5-10 reps ea ?Reviewed breathing/pursed-lip breathing techniques. ?  ?General Comments General comments (skin integrity, edema,  etc.): BP 119/99 (107) seated EOB, BP 127/90 (98) standing; HR 85 bpm resting, 95-108 bpm with exertion ?  ?  ? ?Pertinent Vitals/Pain Pain Assessment ?Pain Assessment: No/denies pain  ? ? ? ?PT Goals (current goals can now be found in the care plan section) Acute Rehab PT Goals ?Patient Stated Goal: to feel better ?PT Goal Formulation: With patient ?Time For Goal Achievement: 12/06/21 ?Progress towards PT goals: Progressing toward goals ? ?  ?Frequency ? ? ? Min 3X/week ? ? ? ?  ?PT Plan Current plan remains appropriate  ? ? ?   ?AM-PAC PT "6 Clicks" Mobility   ?Outcome Measure ? Help needed turning from your back to your side while in a flat bed without using bedrails?: A Little ?Help needed moving from lying on your back to sitting on the side of a flat bed without using bedrails?: A Little ?Help needed moving to and from a bed to a chair (including a wheelchair)?: A Little ?Help needed standing up from a chair using your arms (e.g., wheelchair or bedside chair)?: A Little ?Help needed to walk in hospital room?: A Little ?Help needed climbing 3-5 steps with a railing? : A Lot ?6 Click Score: 17 ? ?  ?End of Session Equipment Utilized During Treatment: Gait belt ?Activity Tolerance: Patient tolerated treatment well;Patient limited by fatigue ?Patient left: in bed;with call bell/phone within reach;with bed alarm set;with nursing/sitter in room (RN in room to assess IV site due to scant blood/pt c/o itching) ?Nurse Communication: Mobility status;Other (comment) (IV itchy) ?PT Visit Diagnosis: Other abnormalities of gait and mobility (R26.89);Unsteadiness on feet (R26.81);Muscle weakness (generalized) (M62.81) ?  ? ? ?Time: 2060-1561 ?PT Time Calculation (min) (ACUTE ONLY): 38 min ? ?Charges:  $Gait Training: 8-22 mins ?$Therapeutic Exercise: 8-22 mins ?$Therapeutic Activity: 8-22 mins          ?          ? ?Danaja Lasota P., PTA ?Acute Rehabilitation Services ?Secure Chat Preferred 9a-5:30pm ?Office: [phone]   ? ? ?Kara Pacer Tab Rylee ?11/23/2021, 5:15 PM ? ?

## 2021-11-23 NOTE — Assessment & Plan Note (Addendum)
Patient's glucose remained stable, initially placed on insulin sliding scale, then discontinued. ?Fasting glucose at the time of her discharge is 112 mg/dl.   ?

## 2021-11-23 NOTE — Progress Notes (Addendum)
? ? Advanced Heart Failure Rounding Note ? ?PCP-Cardiologist: Buford Dresser, MD  ? ?Subjective:   ? ?Admitted with A fib RVR and A/C HFrEF.  Started on amio drip. Diuresing with IV lasix.   ? ? ?Remains SOB with exertion.  ?Objective:   ?Weight Range: ?(!) 150.4 kg ?Body mass index is 51.93 kg/m?.  ? ?Vital Signs:   ?Temp:  [97.4 ?F (36.3 ?C)-98.4 ?F (36.9 ?C)] 98.3 ?F (36.8 ?C) (04/05 1053) ?Pulse Rate:  [75-94] 85 (04/05 1139) ?Resp:  [14-21] 18 (04/05 1139) ?BP: (118-140)/(70-94) 123/73 (04/05 1139) ?SpO2:  [91 %-98 %] 98 % (04/05 1139) ?Weight:  [150.4 kg] 150.4 kg (04/05 0452) ?Last BM Date : 11/22/21 ? ?Weight change: ?Filed Weights  ? 11/21/21 1110 11/23/21 0452  ?Weight: 134.3 kg (!) 150.4 kg  ? ? ?Intake/Output:  ? ?Intake/Output Summary (Last 24 hours) at 11/23/2021 1314 ?Last data filed at 11/23/2021 1307 ?Gross per 24 hour  ?Intake 1216.99 ml  ?Output 1000 ml  ?Net 216.99 ml  ?  ? ? ?Physical Exam  ?  ?General:  No resp difficulty ?HEENT: Normal ?Neck: Supple. JVP 7-8  Carotids 2+ bilat; no bruits. No lymphadenopathy or thyromegaly appreciated. ?Cor: PMI nondisplaced. Irregular rate & rhythm. No rubs, gallops or murmurs. ?Lungs: Clear ?Abdomen: obese, soft, nontender, nondistended. No hepatosplenomegaly. No bruits or masses. Good bowel sounds. ?Extremities: No cyanosis, clubbing, rash, edema ?Neuro: Alert & orientedx3, cranial nerves grossly intact. moves all 4 extremities w/o difficulty. Affect pleasant ? ? ?Telemetry  ? ?A fib 70-90s personally reviewed  ? ?EKG  ?  ?N/A ? ?Labs  ?  ?CBC ?Recent Labs  ?  11/21/21 ?1122 11/21/21 ?2256 11/22/21 ?3570 11/23/21 ?0416  ?WBC 6.5  --  7.0 6.2  ?NEUTROABS 4.1  --  3.6  --   ?HGB 10.3*   < > 9.5* 9.8*  ?HCT 33.0*   < > 31.2* 31.3*  ?MCV 93.2  --  93.1 93.2  ?PLT 225  --  232 198  ? < > = values in this interval not displayed.  ? ?Basic Metabolic Panel ?Recent Labs  ?  11/22/21 ?1779 11/23/21 ?0416  ?NA 141 137  ?K 3.5 3.4*  ?CL 106 105  ?CO2 25 23   ?GLUCOSE 125* 128*  ?BUN 18 24*  ?CREATININE 2.09* 2.46*  ?CALCIUM 9.1 8.8*  ?MG 1.8 2.1  ?PHOS 3.4  --   ? ?Liver Function Tests ?Recent Labs  ?  11/21/21 ?1122 11/22/21 ?0244  ?AST 26 19  ?ALT 17 15  ?ALKPHOS 154* 133*  ?BILITOT 1.7* 1.1  ?PROT 7.8 7.5  ?ALBUMIN 3.4* 3.2*  ? ?Recent Labs  ?  11/21/21 ?1122  ?LIPASE 22  ? ?Cardiac Enzymes ?Recent Labs  ?  11/22/21 ?0244  ?CKTOTAL 51  ? ? ?BNP: ?BNP (last 3 results) ?Recent Labs  ?  08/31/21 ?0114 09/23/21 ?1554 11/21/21 ?1122  ?BNP 132.7* 31.1 534.8*  ? ? ?ProBNP (last 3 results) ?No results for input(s): PROBNP in the last 8760 hours. ? ? ?D-Dimer ?Recent Labs  ?  11/21/21 ?1122  ?DDIMER <0.27  ? ?Hemoglobin A1C ?Recent Labs  ?  11/21/21 ?2000  ?HGBA1C 6.7*  ? ?Fasting Lipid Panel ?No results for input(s): CHOL, HDL, LDLCALC, TRIG, CHOLHDL, LDLDIRECT in the last 72 hours. ?Thyroid Function Tests ?Recent Labs  ?  11/22/21 ?0244  ?TSH 2.224  ? ? ?Other results: ? ? ?Imaging  ? ? ?No results found. ? ? ?Medications:   ? ? ?Scheduled Medications: ?  amoxicillin-clavulanate  1 tablet Oral Q12H  ? apixaban  5 mg Oral BID  ? busPIRone  5 mg Oral BID  ? colchicine  0.3 mg Oral Daily  ? DULoxetine  60 mg Oral Daily  ? furosemide  80 mg Intravenous Q12H  ? insulin aspart  0-9 Units Subcutaneous Q4H  ? loratadine  10 mg Oral Daily  ? metoprolol succinate  37.5 mg Oral Daily  ? pantoprazole  40 mg Oral Daily  ? rosuvastatin  10 mg Oral QPM  ? sodium chloride flush  3 mL Intravenous Q12H  ? ? ?Infusions: ? sodium chloride    ? amiodarone 30 mg/hr (11/22/21 1802)  ? ? ?PRN Medications: ?sodium chloride, acetaminophen **OR** acetaminophen, albuterol, diphenhydrAMINE-zinc acetate, HYDROcodone-acetaminophen, sodium chloride flush ? ? ? ?Patient Profile  ?66 y.o. female with history of morbid obesity, chronic diastolic and systolic HF with mildly reduced LVEF (45-50%), severe MR/TR, atrial fibrillation on Eliquis, HTN, and DMII.  ? ?Admitted with A Fib RVR and A/C HFrEF.   ? ? ? ?Assessment/Plan  ?1. A fib RVR ?-She has failed Tikosyn and amiodarone. ?- She had Maze in 5/22 and DCCV in 6/22 & 04/19/21, last of which she had ERAF. ?- Saw Dr Quentin Ore in December will need to see if we have any other options.  ?- On admit started on amio drip. Rate better controlled today but remains in  A Fib   ?-Continue eliquis 5 mg twice a day. She has not missed any doses.  ?- Set up cardioversion for Friday.  ?  ?2. A/C HFrEF,NICM ?[place] (5/22): elevated R>L filling pressures with PAPi low, suggestive of significant component of RV dysfunction, prominent v-waves in PCWP and RA tracings suggestive of MR/TR, CO low ?- RHC (6/22) with low filling pressures with moderately reduced CO. ?- RHC 07/2021 normal filling pressures and moderately recued cardiac output.  ?- Echo (7/22) in setting of AFL with RVR, showed EF 30-35%, moderate LVH, moderate RV dysfunction, PASP 48, s/p MV repair with mild MR and mean gradient 10 mmHg with HR around 130, s/p TV repair with mild-moderate TR, dilated IVC.  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  ?- Echo repeated  ?- Volume status elevated and likely worsened with A fib RVR.  ?- Volume status improving. Continue IV lasix .  ?- Renal function stable.    ?  ?Valvular Heart Disease ?-S/p MV and TV repair in 5/22 ?- stable ?  ?CAD/Chest pain ?- trivial CAD on heart cath 2019 ?- CP is chronic for her.  ?- hstrop negative. Doubt ischemic ?  ?CKD Stage IV ?- baseline Scr 2.1-2.8 ?- she is at baseline 2.46 ?  ?DMII ?- per primary  ?- suggest SGLT2i  ?  ?Morbid Obesity  ?- needs weight loss ?- May be candidate for GLP1RA ?  ? ?Length of Stay: 1 ? ?Darrick Grinder, NP  ?11/23/2021, 1:14 PM ? ?Advanced Heart Failure Team ?Pager [phone] (M-F; 7a - 5p)  ?Please contact [facility] for night-coverage after hours (5p -7a ) and weekends on [email or web address] ? ?Patient seen and examined with the above-signed Advanced Practice Provider and/or Housestaff. I personally reviewed laboratory data, imaging  studies and relevant notes. I independently examined the patient and formulated the important aspects of the plan. I have edited the note to reflect any of my changes or salient points. I have personally discussed the plan with the patient and/or family. ? ?Feels weak. Mildly SOB. No orthopnea or PND. Diuresing moderately. Remains in AF despite  IV amio ? ?General:  Weak appearing. No resp difficulty ?HEENT: normal ?Neck: supple. JVP 8-9. Carotids 2+ bilat; no bruits. No lymphadenopathy or thryomegaly appreciated. ?Cor: PMI nondisplaced. Irregular rate & rhythm. No rubs, gallops or murmurs. ?Lungs: clear ?Abdomen: obese soft, nontender, nondistended. No hepatosplenomegaly. No bruits or masses. Good bowel sounds. ?Extremities: no cyanosis, clubbing, rash, tr-1+ edema ?Neuro: alert & orientedx3, cranial nerves grossly intact. moves all 4 extremities w/o difficulty. Affect pleasant ? ?Needs more IV diuresis. Continue IV lasix. Continue IV amio for AF. If doesn't convert chemically will plan DC-CV on Friday. Continue Eliquis. Watch renal function with diuresis.  ? ?Glori Bickers, MD  ?11:51 PM ? ? ? ? ?Glori Bickers, MD  ?11:48 PM ? ?

## 2021-11-23 NOTE — Assessment & Plan Note (Addendum)
Patient had a prolonged hospitalization, admitted to the cardiac ward and had aggressive diuresis with furosemide and metolazone.  ?Negative fluid balance was achieved -25,523 ml, since admission with improvement in her symptoms.  ? ?Echocardiogram with reduced LV systolic function 35 to 33% with global hypokinesis, interventricular septum is flattened and systole and diastole. Reduced RV systolic function, moderate. RVSP 46.9. sever dilatation of right and left atriums. Severe tricuspid valve regurgitation.  ? ?Cardiac catheterization with PA 67/37, mean 45, PCW 30, with cardiac output 6,2 and index 2,5 ?Consistent with biventricular failure, with pulmonary hypertension ?Pulmonary hypertension.  ? ?Patient was placed on inotropic support with milrinone and aggressive diuresis with furosemide drip.  ?Prolonged hospitalization due to development of renal failure limiting further diuresis.  ? ?At the time of her discharge her renal function improved and allowing diuresis.  ?At the time of her discharge she will continue with metoprolol and diuresis with oral furosemide plus metolazone. ?Will need further follow up as outpatient.  ?Holding ras inhibition due to risk of worsening renal function. ?Start SGLT2i as outpatient when renal function more stable.  ? ? ?

## 2021-11-23 NOTE — Assessment & Plan Note (Addendum)
CKD stage 4, Hypokalemia. ? ?Patient developed AKI related to diuresis. With conservative medical care, renal function has improved and diuresis has been resumed.  ? ?At the time of her discharge her renal function has a serum cr of 2.97, K 3,3 and serum bicarbonate at 30. ?KCl repletion before discharge and plan to follow up renal function as outpatient. ?

## 2021-11-23 NOTE — Assessment & Plan Note (Addendum)
Completed 7 days of Augmentin.  ?

## 2021-11-23 NOTE — Progress Notes (Signed)
?  Transition of Care (TOC) Screening Note ? ? ?Patient Details  ?Name: [name] ?Date of Birth: 21-Sep-1955 ? ? ?Transition of Care (TOC) CM/SW Contact:    ?Shadee Montoya, LCSW ?Phone Number: ?11/23/2021, 2:44 PM ? ? ? ?Transition of Care Department [facility]) has reviewed patient and no TOC needs have been identified at this time. We will continue to monitor patient advancement through interdisciplinary progression rounds. Patient will benefit from PT/OT consult for disposition recommendations. If new patient transition needs arise, please place a TOC consult. ?  ?

## 2021-11-23 NOTE — Progress Notes (Addendum)
HOSPITAL MEDICINE OVERNIGHT EVENT NOTE   ? ?Notified by nursing that patient has been complaining of atypical chest discomfort.  Patient describes chest discomfort as midsternal in location, sharp and pressure-like in quality.  Patient is not in respiratory distress and is hemodynamically stable. ? ?Of note, patient initially presented to the hospital on 4/3 with similar chest discomfort and at the time had several rounds of cardiac enzymes performed all of which were unremarkable. ? ?Repeat EKG has been obtained and reveals no evidence of dynamic ST segment change.  We will obtain a repeat troponin to ensure that this has remained negative.  Considering that this is unlikely to be cardiac, bedside nurse is providing patient with a dose of Norco which the patient had a standing order for.  We will additionally administer a dose of GI cocktail if there is no response to this. ? ?We will follow-up on troponin, continue to monitor closely on telemetry. ? ?Leslie Emerald  MD ?[facility]  ? ?ADDENDUM (4/6 6:20AM) ? ?Troponin unremarkable.  Patient continues to have intermittent atypical chest pain.  Continue to monitor on telemetry. ? ?[name] ? ? ? ? ? ? ? ? ? ? ? ? ?

## 2021-11-24 ENCOUNTER — Encounter (HOSPITAL_COMMUNITY)
Admission: EM | Disposition: A | Discharge status: Home Health | Payer: Self-pay | Source: Home / Self Care | Attending: Internal Medicine

## 2021-11-24 ENCOUNTER — Encounter (HOSPITAL_COMMUNITY): Payer: Self-pay | Admitting: Internal Medicine

## 2021-11-24 DIAGNOSIS — I5023 Acute on chronic systolic (congestive) heart failure: Secondary | ICD-10-CM | POA: Diagnosis not present

## 2021-11-24 DIAGNOSIS — N179 Acute kidney failure, unspecified: Secondary | ICD-10-CM | POA: Diagnosis not present

## 2021-11-24 DIAGNOSIS — N189 Chronic kidney disease, unspecified: Secondary | ICD-10-CM

## 2021-11-24 DIAGNOSIS — E1169 Type 2 diabetes mellitus with other specified complication: Secondary | ICD-10-CM | POA: Diagnosis not present

## 2021-11-24 DIAGNOSIS — I4891 Unspecified atrial fibrillation: Secondary | ICD-10-CM | POA: Diagnosis not present

## 2021-11-24 DIAGNOSIS — I480 Paroxysmal atrial fibrillation: Secondary | ICD-10-CM | POA: Diagnosis not present

## 2021-11-24 HISTORY — PX: RIGHT HEART CATH: CATH118263

## 2021-11-24 LAB — GLUCOSE, CAPILLARY
Glucose-Capillary: 133 mg/dL — ABNORMAL HIGH (ref 70–99)
Glucose-Capillary: 147 mg/dL — ABNORMAL HIGH (ref 70–99)
Glucose-Capillary: 157 mg/dL — ABNORMAL HIGH (ref 70–99)
Glucose-Capillary: 160 mg/dL — ABNORMAL HIGH (ref 70–99)

## 2021-11-24 LAB — CBC
HCT: 30 % — ABNORMAL LOW (ref 36.0–46.0)
Hemoglobin: 9.4 g/dL — ABNORMAL LOW (ref 12.0–15.0)
MCH: 29 pg (ref 26.0–34.0)
MCHC: 31.3 g/dL (ref 30.0–36.0)
MCV: 92.6 fL (ref 80.0–100.0)
Platelets: 256 10*3/uL (ref 150–400)
RBC: 3.24 MIL/uL — ABNORMAL LOW (ref 3.87–5.11)
RDW: 15.6 % — ABNORMAL HIGH (ref 11.5–15.5)
WBC: 6.7 10*3/uL (ref 4.0–10.5)
nRBC: 0 % (ref 0.0–0.2)

## 2021-11-24 LAB — BASIC METABOLIC PANEL
Anion gap: 7 (ref 5–15)
BUN: 28 mg/dL — ABNORMAL HIGH (ref 8–23)
CO2: 23 mmol/L (ref 22–32)
Calcium: 8.9 mg/dL (ref 8.9–10.3)
Chloride: 107 mmol/L (ref 98–111)
Creatinine, Ser: 2.63 mg/dL — ABNORMAL HIGH (ref 0.44–1.00)
GFR, Estimated: 20 mL/min — ABNORMAL LOW (ref >60)
Glucose, Bld: 167 mg/dL — ABNORMAL HIGH (ref 70–99)
Potassium: 4.1 mmol/L (ref 3.5–5.1)
Sodium: 137 mmol/L (ref 135–145)

## 2021-11-24 LAB — POCT I-STAT EG7
Acid-Base Excess: 0 mmol/L (ref 0.0–2.0)
Acid-Base Excess: 2 mmol/L (ref 0.0–2.0)
Bicarbonate: 25.9 mmol/L (ref 20.0–28.0)
Bicarbonate: 27.6 mmol/L (ref 20.0–28.0)
Calcium, Ion: 1.22 mmol/L (ref 1.15–1.40)
Calcium, Ion: 1.25 mmol/L (ref 1.15–1.40)
HCT: 32 % — ABNORMAL LOW (ref 36.0–46.0)
HCT: 32 % — ABNORMAL LOW (ref 36.0–46.0)
Hemoglobin: 10.9 g/dL — ABNORMAL LOW (ref 12.0–15.0)
Hemoglobin: 10.9 g/dL — ABNORMAL LOW (ref 12.0–15.0)
O2 Saturation: 55 %
O2 Saturation: 50 %
Potassium: 4.5 mmol/L (ref 3.5–5.1)
Potassium: 4.6 mmol/L (ref 3.5–5.1)
Sodium: 139 mmol/L (ref 135–145)
Sodium: 139 mmol/L (ref 135–145)
TCO2: 27 mmol/L (ref 22–32)
TCO2: 29 mmol/L (ref 22–32)
pCO2, Ven: 47.9 mmHg (ref 44–60)
pCO2, Ven: 49.2 mmHg (ref 44–60)
pH, Ven: 7.341 (ref 7.25–7.43)
pH, Ven: 7.357 (ref 7.25–7.43)
pO2, Ven: 31 mmHg — CL (ref 32–45)
pO2, Ven: 29 mmHg — CL (ref 32–45)

## 2021-11-24 LAB — PROTIME-INR
INR: 2.3 — ABNORMAL HIGH (ref 0.8–1.2)
Prothrombin Time: 25.2 seconds — ABNORMAL HIGH (ref 11.4–15.2)

## 2021-11-24 LAB — MAGNESIUM: Magnesium: 2.1 mg/dL (ref 1.7–2.4)

## 2021-11-24 LAB — TROPONIN I (HIGH SENSITIVITY): Troponin I (High Sensitivity): 4 ng/L (ref <18)

## 2021-11-24 SURGERY — RIGHT HEART CATH
Anesthesia: LOCAL

## 2021-11-24 MED ORDER — ACETAMINOPHEN 325 MG PO TABS: 650.0000 mg | ORAL_TABLET | ORAL | Status: DC | PRN

## 2021-11-24 MED ORDER — SODIUM CHLORIDE 0.9 % IV SOLN: 250.0000 mL | INTRAVENOUS | Status: DC | PRN

## 2021-11-24 MED ORDER — SODIUM CHLORIDE 0.9% FLUSH: 3.0000 mL | INTRAVENOUS | Status: DC | PRN

## 2021-11-24 MED ORDER — ASPIRIN 81 MG PO CHEW
81.0000 mg | CHEWABLE_TABLET | ORAL | Status: DC
Start: 2021-11-24 — End: 2021-11-24

## 2021-11-24 MED ORDER — HYDRALAZINE HCL 20 MG/ML IJ SOLN: 10.0000 mg | INTRAMUSCULAR | Status: AC | PRN

## 2021-11-24 MED ORDER — POTASSIUM CHLORIDE CRYS ER 20 MEQ PO TBCR: 40.0000 meq | EXTENDED_RELEASE_TABLET | Freq: Every day | ORAL | Status: DC

## 2021-11-24 MED ORDER — SODIUM CHLORIDE 0.9% FLUSH
3.0000 mL | Freq: Two times a day (BID) | INTRAVENOUS | Status: DC
  Administered 2021-11-29 – 2021-12-06 (×5): 3 mL via INTRAVENOUS

## 2021-11-24 MED ORDER — DIPHENHYDRAMINE HCL 25 MG PO CAPS
25.0000 mg | ORAL_CAPSULE | Freq: Four times a day (QID) | ORAL | Status: DC | PRN
  Administered 2021-11-25: 25 mg via ORAL
  Filled 2021-11-24 (×2): qty 1

## 2021-11-24 MED ORDER — LIDOCAINE HCL (PF) 1 % IJ SOLN
INTRAMUSCULAR | Status: DC | PRN
  Administered 2021-11-24: 2 mL

## 2021-11-24 MED ORDER — SODIUM CHLORIDE 0.9% FLUSH
3.0000 mL | Freq: Two times a day (BID) | INTRAVENOUS | Status: DC
  Administered 2021-11-24 – 2021-12-07 (×15): 3 mL via INTRAVENOUS

## 2021-11-24 MED ORDER — LABETALOL HCL 5 MG/ML IV SOLN: 10.0000 mg | INTRAVENOUS | Status: AC | PRN

## 2021-11-24 MED ORDER — HEPARIN (PORCINE) IN NACL 1000-0.9 UT/500ML-% IV SOLN
INTRAVENOUS | Status: AC
  Filled 2021-11-24: qty 1000

## 2021-11-24 MED ORDER — POTASSIUM CHLORIDE CRYS ER 20 MEQ PO TBCR
40.0000 meq | EXTENDED_RELEASE_TABLET | Freq: Two times a day (BID) | ORAL | Status: DC
  Administered 2021-11-24 – 2021-11-29 (×10): 40 meq via ORAL
  Filled 2021-11-24 (×10): qty 2

## 2021-11-24 MED ORDER — HEPARIN (PORCINE) IN NACL 1000-0.9 UT/500ML-% IV SOLN
INTRAVENOUS | Status: DC | PRN
Start: 2021-11-24 — End: 2021-11-24
  Administered 2021-11-24: 500 mL

## 2021-11-24 MED ORDER — SODIUM CHLORIDE 0.9 % IV SOLN: INTRAVENOUS | Status: DC

## 2021-11-24 MED ORDER — SODIUM CHLORIDE 0.9 % IV SOLN
INTRAVENOUS | Status: DC
Start: 2021-11-24 — End: 2021-11-24

## 2021-11-24 MED ORDER — FUROSEMIDE 10 MG/ML IJ SOLN
15.0000 mg/h | INTRAVENOUS | Status: DC
  Administered 2021-11-24: 10 mg/h via INTRAVENOUS
  Administered 2021-11-25 – 2021-11-26 (×3): 20 mg/h via INTRAVENOUS
  Administered 2021-11-27: 15 mg/h via INTRAVENOUS
  Administered 2021-11-27: 20 mg/h via INTRAVENOUS
  Administered 2021-11-28: 15 mg/h via INTRAVENOUS
  Filled 2021-11-24 (×9): qty 20

## 2021-11-24 MED ORDER — MILRINONE LACTATE IN DEXTROSE 20-5 MG/100ML-% IV SOLN
0.1250 ug/kg/min | INTRAVENOUS | Status: DC
  Administered 2021-11-24 – 2021-11-29 (×7): 0.125 ug/kg/min via INTRAVENOUS
  Filled 2021-11-24 (×7): qty 100

## 2021-11-24 MED ORDER — ASPIRIN 81 MG PO CHEW
81.0000 mg | CHEWABLE_TABLET | ORAL | Status: DC
Start: 2021-11-25 — End: 2021-11-24

## 2021-11-24 MED ORDER — LIDOCAINE HCL (PF) 1 % IJ SOLN
INTRAMUSCULAR | Status: AC
  Filled 2021-11-24: qty 30

## 2021-11-24 SURGICAL SUPPLY — 10 items
CATH BALLN WEDGE 5F 110CM (CATHETERS) ×1 IMPLANT
GUIDEWIRE .025 260CM (WIRE) ×1 IMPLANT
KIT MICROPUNCTURE NIT STIFF (SHEATH) ×1 IMPLANT
PACK CARDIAC CATHETERIZATION (CUSTOM PROCEDURE TRAY) ×2 IMPLANT
PROTECTION STATION PRESSURIZED (MISCELLANEOUS) ×2
SHEATH GLIDE SLENDER 4/5FR (SHEATH) ×2 IMPLANT
SHEATH PROBE COVER 6X72 (BAG) ×1 IMPLANT
STATION PROTECTION PRESSURIZED (MISCELLANEOUS) IMPLANT
TRANSDUCER W/STOPCOCK (MISCELLANEOUS) ×2 IMPLANT
WIRE EMERALD 3MM-J .025X260CM (WIRE) ×1 IMPLANT

## 2021-11-24 NOTE — H&P (View-Only) (Signed)
? ? Advanced Heart Failure Rounding Note ? ?PCP-Cardiologist: Buford Dresser, MD  ? ?Subjective:   ? ?Admitted with A fib RVR and A/C HFrEF.  Started on amio drip. Diuresing with IV lasix.   ? ?Echo Ef 30-35% RV moderately reduced. Elevated pulmonary pressures, TV severe TR.  ? ?Currently on amio drip. Remains in  A fib. ? ?Weight trending up but she had a bed weight.  ? ?Creatinine 2>2.5>2.6.  ? ?Denies SOB.  ? ?Objective:   ?Weight Range: ?(!) 151.8 kg ?Body mass index is 52.41 kg/m?.  ? ?Vital Signs:   ?Temp:  [97.4 ?F (36.3 ?C)-98.3 ?F (36.8 ?C)] 97.4 ?F (36.3 ?C) (04/06 0413) ?Pulse Rate:  [85-90] 90 (04/06 0413) ?Resp:  [18-20] 19 (04/06 0413) ?BP: (106-123)/(73) 106/73 (04/06 0413) ?SpO2:  [96 %-98 %] 96 % (04/06 0413) ?Weight:  [151.8 kg] 151.8 kg (04/06 0413) ?Last BM Date : 11/23/21 ? ?Weight change: ?Filed Weights  ? 11/21/21 1110 11/23/21 0452 11/24/21 0413  ?Weight: 134.3 kg (!) 150.4 kg (!) 151.8 kg  ? ? ?Intake/Output:  ? ?Intake/Output Summary (Last 24 hours) at 11/24/2021 0907 ?Last data filed at 11/24/2021 0758 ?Gross per 24 hour  ?Intake 1643.11 ml  ?Output 625 ml  ?Net 1018.11 ml  ?  ? ? ?Physical Exam  ?  ?General No resp difficulty ?HEENT: normal ?Neck: supple. JVD 6-7  Carotids 2+ bilat; no bruits. No lymphadenopathy or thryomegaly appreciated. ?Cor: PMI nondisplaced. Irregular rate & rhythm. No rubs, gallops.  LLSB 2/6 . ?Lungs: clear ?Abdomen: obese, soft, nontender, nondistended. No hepatosplenomegaly. No bruits or masses. Good bowel sounds. ?Extremities: no cyanosis, clubbing, rash, edema ?Neuro: alert & orientedx3, cranial nerves grossly intact. moves all 4 extremities w/o difficulty. Affect pleasant ? ? ?Telemetry  ? ? Aifb 80-90s. Check EKG  ? ?EKG  ?  ?A fib 79 bpm  ? ?Labs  ?  ?CBC ?Recent Labs  ?  11/21/21 ?1122 11/21/21 ?2256 11/22/21 ?6160 11/23/21 ?7371 11/24/21 ?0626  ?WBC 6.5  --  7.0 6.2 6.7  ?NEUTROABS 4.1  --  3.6  --   --   ?HGB 10.3*   < > 9.5* 9.8* 9.4*  ?HCT 33.0*    < > 31.2* 31.3* 30.0*  ?MCV 93.2  --  93.1 93.2 92.6  ?PLT 225  --  232 198 256  ? < > = values in this interval not displayed.  ? ?Basic Metabolic Panel ?Recent Labs  ?  11/22/21 ?9485 11/23/21 ?4627 11/24/21 ?0350  ?NA 141 137 137  ?K 3.5 3.4* 4.1  ?CL 106 105 107  ?CO2 '25 23 23  '$ ?GLUCOSE 125* 128* 167*  ?BUN 18 24* 28*  ?CREATININE 2.09* 2.46* 2.63*  ?CALCIUM 9.1 8.8* 8.9  ?MG 1.8 2.1 2.1  ?PHOS 3.4  --   --   ? ?Liver Function Tests ?Recent Labs  ?  11/21/21 ?1122 11/22/21 ?0244  ?AST 26 19  ?ALT 17 15  ?ALKPHOS 154* 133*  ?BILITOT 1.7* 1.1  ?PROT 7.8 7.5  ?ALBUMIN 3.4* 3.2*  ? ?Recent Labs  ?  11/21/21 ?1122  ?LIPASE 22  ? ?Cardiac Enzymes ?Recent Labs  ?  11/22/21 ?0244  ?CKTOTAL 51  ? ? ?BNP: ?BNP (last 3 results) ?Recent Labs  ?  08/31/21 ?0114 09/23/21 ?1554 11/21/21 ?1122  ?BNP 132.7* 31.1 534.8*  ? ? ?ProBNP (last 3 results) ?No results for input(s): PROBNP in the last 8760 hours. ? ? ?D-Dimer ?Recent Labs  ?  11/21/21 ?1122  ?DDIMER <0.27  ? ?  Hemoglobin A1C ?Recent Labs  ?  11/21/21 ?2000  ?HGBA1C 6.7*  ? ?Fasting Lipid Panel ?No results for input(s): CHOL, HDL, LDLCALC, TRIG, CHOLHDL, LDLDIRECT in the last 72 hours. ?Thyroid Function Tests ?Recent Labs  ?  11/22/21 ?0244  ?TSH 2.224  ? ? ?Other results: ? ? ?Imaging  ? ? ?No results found. ? ? ?Medications:   ? ? ?Scheduled Medications: ? (feeding supplement) PROSource Plus  30 mL Oral BID BM  ? amoxicillin-clavulanate  1 tablet Oral Q12H  ? apixaban  5 mg Oral BID  ? busPIRone  5 mg Oral BID  ? colchicine  0.3 mg Oral Daily  ? DULoxetine  60 mg Oral Daily  ? furosemide  80 mg Intravenous Q12H  ? insulin aspart  0-9 Units Subcutaneous Q4H  ? loratadine  10 mg Oral Daily  ? metoprolol succinate  37.5 mg Oral Daily  ? multivitamin with minerals  1 tablet Oral Daily  ? pantoprazole  40 mg Oral Daily  ? potassium chloride  40 mEq Oral BID  ? rosuvastatin  10 mg Oral QPM  ? sodium chloride flush  3 mL Intravenous Q12H  ? ? ?Infusions: ? sodium chloride    ?  amiodarone 30 mg/hr (11/24/21 4235)  ? ? ?PRN Medications: ?sodium chloride, acetaminophen **OR** acetaminophen, albuterol, diphenhydrAMINE-zinc acetate, HYDROcodone-acetaminophen, sodium chloride flush ? ? ? ?Patient Profile  ?66 y.o. female with history of morbid obesity, chronic diastolic and systolic HF with mildly reduced LVEF (45-50%), severe MR/TR, atrial fibrillation on Eliquis, HTN, and DMII.  ? ?Admitted with A Fib RVR and A/C HFrEF.  ? ? ? ?Assessment/Plan  ?1. A fib RVR ?-She has failed Tikosyn and amiodarone. ?- She had Maze in 5/22 and DCCV in 6/22 & 04/19/21, last of which she had ERAF. ?- Saw Dr Quentin Ore in December will need to see if we have any other options.  ?- On admit started on amio drip. Rate controlled in A Fib.  ?-Continue eliquis 5 mg twice a day. She has not missed any doses.  ?- Check EKG.  ?- Cardioversion on Friday.   ?  ?2. A/C HFrEF,NICM ?[place] (5/22): elevated R>L filling pressures with PAPi low, suggestive of significant component of RV dysfunction, prominent v-waves in PCWP and RA tracings suggestive of MR/TR, CO low ?- RHC (6/22) with low filling pressures with moderately reduced CO. ?- RHC 07/2021 normal filling pressures and moderately recued cardiac output.  ?- Echo (7/22) in setting of AFL with RVR, showed EF 30-35%, moderate LVH, moderate RV dysfunction, PASP 48, s/p MV repair with mild MR and mean gradient 10 mmHg with HR around 130, s/p TV repair with mild-moderate TR, dilated IVC.  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  ?- Echo 11/22/21- EF 30-35% RV moderately reduced. RA/LA dilated. RVSP 47.  ?- Volume status elevated and likely worsened with A fib RVR.  ?- Given one more dose of IV lasix then stop. Renal function trending up.  ?- Set up [place] today.  ? ?Valvular Heart Disease ?-S/p MV and TV repair in 5/22 ?-Echo this admit TV now with severe regurgitation. May need  TEE.  ?-Not sure she has any options for repair .  ?- Discussed with Dr [name]. Start with [place] today.  ? ?  CAD/Chest pain ?- trivial CAD on heart cath 2019 ?- CP is chronic for her.  ?- hstrop negative. Doubt ischemic ?  ?CKD Stage IV ?- baseline Scr 2-2.5 ?- Creatinine trending up  ?  ?DMII ?-  per primary  ?- suggest SGLT2i  ?  ?Morbid Obesity  ?- needs weight loss ?- May be candidate for GLP1RA ?  ? ?[place] today  ? ?Length of Stay: 2 ? ?Darrick Grinder, NP  ?11/24/2021, 9:07 AM ? ?Advanced Heart Failure Team ?Pager [phone] (M-F; 7a - 5p)  ?Please contact [facility] for night-coverage after hours (5p -7a ) and weekends on [email or web address] ? ?Patient seen and examined with the above-signed Advanced Practice Provider and/or Housestaff. I personally reviewed laboratory data, imaging studies and relevant notes. I independently examined the patient and formulated the important aspects of the plan. I have edited the note to reflect any of my changes or salient points. I have personally discussed the plan with the patient and/or family. ? ?Continues to feel fatigued and SOB. Diuresing. Weight inaccurate. Remains in AF despite IV amio. ? ?Echo EF 35-40% recurrent severe TR with evidence of pulmonary HTN and RV strain.  ? ?General:  Weak appearing. No resp difficulty ?HEENT: normal ?Neck: supple. JVP 7-8 Carotids 2+ bilat; no bruits. No lymphadenopathy or thryomegaly appreciated. ?Cor: PMI nondisplaced. Irregular rate & rhythm. 3/6 TR ?Lungs: clear ?Abdomen: obese soft, nontender, nondistended. No hepatosplenomegaly. No bruits or masses. Good bowel sounds. ?Extremities: no cyanosis, clubbing, rash, tr edema ?Neuro: alert & orientedx3, cranial nerves grossly intact. moves all 4 extremities w/o difficulty. Affect pleasant ? ?Echo with evidence of severe TR despite previous TVRing. There is also evidence of PH with RV strain. Will plan RHC today to further evaluate. Continue IV amio. Plan DC-CV of AF tomorrow. Continue Eliquis.  ? ?Glori Bickers, MD  ?10:29 AM ? ?  ?

## 2021-11-24 NOTE — Interval H&P Note (Signed)
History and Physical Interval Note: ? ?11/24/2021 ?10:30 AM ? ?[name]  has presented today for surgery, with the diagnosis of hf.  The various methods of treatment have been discussed with the patient and family. After consideration of risks, benefits and other options for treatment, the patient has consented to  Procedure(s): ?RIGHT HEART CATH (N/A) as a surgical intervention.  The patient's history has been reviewed, patient examined, no change in status, stable for surgery.  I have reviewed the patient's chart and labs.  Questions were answered to the patient's satisfaction.   ? ? ?[name] ? ? ?

## 2021-11-24 NOTE — Progress Notes (Signed)
? ?  RHC results reviewed. Biventricular HF with marked volume overload.  ? ?Milrinone 0.125 mcg started and will start lasix drip 10 mg drip.  ? ?Leslie Grisby NP-C  ?1:56 PM ? ? ? ? ?

## 2021-11-24 NOTE — Progress Notes (Addendum)
? ? Advanced Heart Failure Rounding Note ? ?PCP-Cardiologist: Buford Dresser, MD  ? ?Subjective:   ? ?Admitted with A fib RVR and A/C HFrEF.  Started on amio drip. Diuresing with IV lasix.   ? ?Echo Ef 30-35% RV moderately reduced. Elevated pulmonary pressures, TV severe TR.  ? ?Currently on amio drip. Remains in  A fib. ? ?Weight trending up but she had a bed weight.  ? ?Creatinine 2>2.5>2.6.  ? ?Denies SOB.  ? ?Objective:   ?Weight Range: ?(!) 151.8 kg ?Body mass index is 52.41 kg/m?.  ? ?Vital Signs:   ?Temp:  [97.4 ?F (36.3 ?C)-98.3 ?F (36.8 ?C)] 97.4 ?F (36.3 ?C) (04/06 0413) ?Pulse Rate:  [85-90] 90 (04/06 0413) ?Resp:  [18-20] 19 (04/06 0413) ?BP: (106-123)/(73) 106/73 (04/06 0413) ?SpO2:  [96 %-98 %] 96 % (04/06 0413) ?Weight:  [151.8 kg] 151.8 kg (04/06 0413) ?Last BM Date : 11/23/21 ? ?Weight change: ?Filed Weights  ? 11/21/21 1110 11/23/21 0452 11/24/21 0413  ?Weight: 134.3 kg (!) 150.4 kg (!) 151.8 kg  ? ? ?Intake/Output:  ? ?Intake/Output Summary (Last 24 hours) at 11/24/2021 0907 ?Last data filed at 11/24/2021 0758 ?Gross per 24 hour  ?Intake 1643.11 ml  ?Output 625 ml  ?Net 1018.11 ml  ?  ? ? ?Physical Exam  ?  ?General No resp difficulty ?HEENT: normal ?Neck: supple. JVD 6-7  Carotids 2+ bilat; no bruits. No lymphadenopathy or thryomegaly appreciated. ?Cor: PMI nondisplaced. Irregular rate & rhythm. No rubs, gallops.  LLSB 2/6 . ?Lungs: clear ?Abdomen: obese, soft, nontender, nondistended. No hepatosplenomegaly. No bruits or masses. Good bowel sounds. ?Extremities: no cyanosis, clubbing, rash, edema ?Neuro: alert & orientedx3, cranial nerves grossly intact. moves all 4 extremities w/o difficulty. Affect pleasant ? ? ?Telemetry  ? ? Aifb 80-90s. Check EKG  ? ?EKG  ?  ?A fib 79 bpm  ? ?Labs  ?  ?CBC ?Recent Labs  ?  11/21/21 ?1122 11/21/21 ?2256 11/22/21 ?8938 11/23/21 ?1017 11/24/21 ?5102  ?WBC 6.5  --  7.0 6.2 6.7  ?NEUTROABS 4.1  --  3.6  --   --   ?HGB 10.3*   < > 9.5* 9.8* 9.4*  ?HCT 33.0*    < > 31.2* 31.3* 30.0*  ?MCV 93.2  --  93.1 93.2 92.6  ?PLT 225  --  232 198 256  ? < > = values in this interval not displayed.  ? ?Basic Metabolic Panel ?Recent Labs  ?  11/22/21 ?5852 11/23/21 ?7782 11/24/21 ?4235  ?NA 141 137 137  ?K 3.5 3.4* 4.1  ?CL 106 105 107  ?CO2 '25 23 23  '$ ?GLUCOSE 125* 128* 167*  ?BUN 18 24* 28*  ?CREATININE 2.09* 2.46* 2.63*  ?CALCIUM 9.1 8.8* 8.9  ?MG 1.8 2.1 2.1  ?PHOS 3.4  --   --   ? ?Liver Function Tests ?Recent Labs  ?  11/21/21 ?1122 11/22/21 ?0244  ?AST 26 19  ?ALT 17 15  ?ALKPHOS 154* 133*  ?BILITOT 1.7* 1.1  ?PROT 7.8 7.5  ?ALBUMIN 3.4* 3.2*  ? ?Recent Labs  ?  11/21/21 ?1122  ?LIPASE 22  ? ?Cardiac Enzymes ?Recent Labs  ?  11/22/21 ?0244  ?CKTOTAL 51  ? ? ?BNP: ?BNP (last 3 results) ?Recent Labs  ?  08/31/21 ?0114 09/23/21 ?1554 11/21/21 ?1122  ?BNP 132.7* 31.1 534.8*  ? ? ?ProBNP (last 3 results) ?No results for input(s): PROBNP in the last 8760 hours. ? ? ?D-Dimer ?Recent Labs  ?  11/21/21 ?1122  ?DDIMER <0.27  ? ?  Hemoglobin A1C ?Recent Labs  ?  11/21/21 ?2000  ?HGBA1C 6.7*  ? ?Fasting Lipid Panel ?No results for input(s): CHOL, HDL, LDLCALC, TRIG, CHOLHDL, LDLDIRECT in the last 72 hours. ?Thyroid Function Tests ?Recent Labs  ?  11/22/21 ?0244  ?TSH 2.224  ? ? ?Other results: ? ? ?Imaging  ? ? ?No results found. ? ? ?Medications:   ? ? ?Scheduled Medications: ? (feeding supplement) PROSource Plus  30 mL Oral BID BM  ? amoxicillin-clavulanate  1 tablet Oral Q12H  ? apixaban  5 mg Oral BID  ? busPIRone  5 mg Oral BID  ? colchicine  0.3 mg Oral Daily  ? DULoxetine  60 mg Oral Daily  ? furosemide  80 mg Intravenous Q12H  ? insulin aspart  0-9 Units Subcutaneous Q4H  ? loratadine  10 mg Oral Daily  ? metoprolol succinate  37.5 mg Oral Daily  ? multivitamin with minerals  1 tablet Oral Daily  ? pantoprazole  40 mg Oral Daily  ? potassium chloride  40 mEq Oral BID  ? rosuvastatin  10 mg Oral QPM  ? sodium chloride flush  3 mL Intravenous Q12H  ? ? ?Infusions: ? sodium chloride    ?  amiodarone 30 mg/hr (11/24/21 7341)  ? ? ?PRN Medications: ?sodium chloride, acetaminophen **OR** acetaminophen, albuterol, diphenhydrAMINE-zinc acetate, HYDROcodone-acetaminophen, sodium chloride flush ? ? ? ?Patient Profile  ?66 y.o. female with history of morbid obesity, chronic diastolic and systolic HF with mildly reduced LVEF (45-50%), severe MR/TR, atrial fibrillation on Eliquis, HTN, and DMII.  ? ?Admitted with A Fib RVR and A/C HFrEF.  ? ? ? ?Assessment/Plan  ?1. A fib RVR ?-She has failed Tikosyn and amiodarone. ?- She had Maze in 5/22 and DCCV in 6/22 & 04/19/21, last of which she had ERAF. ?- Saw Dr Quentin Ore in December will need to see if we have any other options.  ?- On admit started on amio drip. Rate controlled in A Fib.  ?-Continue eliquis 5 mg twice a day. She has not missed any doses.  ?- Check EKG.  ?- Cardioversion on Friday.   ?  ?2. A/C HFrEF,NICM ?[name] (5/22): elevated R>L filling pressures with PAPi low, suggestive of significant component of RV dysfunction, prominent v-waves in PCWP and RA tracings suggestive of MR/TR, CO low ?- RHC (6/22) with low filling pressures with moderately reduced CO. ?- RHC 07/2021 normal filling pressures and moderately recued cardiac output.  ?- Echo (7/22) in setting of AFL with RVR, showed EF 30-35%, moderate LVH, moderate RV dysfunction, PASP 48, s/p MV repair with mild MR and mean gradient 10 mmHg with HR around 130, s/p TV repair with mild-moderate TR, dilated IVC.  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  ?- Echo 11/22/21- EF 30-35% RV moderately reduced. RA/LA dilated. RVSP 47.  ?- Volume status elevated and likely worsened with A fib RVR.  ?- Given one more dose of IV lasix then stop. Renal function trending up.  ?- Set up [place] today.  ? ?Valvular Heart Disease ?-S/p MV and TV repair in 5/22 ?-Echo this admit TV now with severe regurgitation. May need  TEE.  ?-Not sure she has any options for repair .  ?- Discussed with Dr [name]. Start with [place] today.  ? ?  CAD/Chest pain ?- trivial CAD on heart cath 2019 ?- CP is chronic for her.  ?- hstrop negative. Doubt ischemic ?  ?CKD Stage IV ?- baseline Scr 2-2.5 ?- Creatinine trending up  ?  ?DMII ?-  per primary  ?- suggest SGLT2i  ?  ?Morbid Obesity  ?- needs weight loss ?- May be candidate for GLP1RA ?  ? ?[place] today  ? ?Length of Stay: 2 ? ?Darrick Grinder, NP  ?11/24/2021, 9:07 AM ? ?Advanced Heart Failure Team ?Pager [phone] (M-F; 7a - 5p)  ?Please contact [facility] for night-coverage after hours (5p -7a ) and weekends on [email or web address] ? ?Patient seen and examined with the above-signed Advanced Practice Provider and/or Housestaff. I personally reviewed laboratory data, imaging studies and relevant notes. I independently examined the patient and formulated the important aspects of the plan. I have edited the note to reflect any of my changes or salient points. I have personally discussed the plan with the patient and/or family. ? ?Continues to feel fatigued and SOB. Diuresing. Weight inaccurate. Remains in AF despite IV amio. ? ?Echo EF 35-40% recurrent severe TR with evidence of pulmonary HTN and RV strain.  ? ?General:  Weak appearing. No resp difficulty ?HEENT: normal ?Neck: supple. JVP 7-8 Carotids 2+ bilat; no bruits. No lymphadenopathy or thryomegaly appreciated. ?Cor: PMI nondisplaced. Irregular rate & rhythm. 3/6 TR ?Lungs: clear ?Abdomen: obese soft, nontender, nondistended. No hepatosplenomegaly. No bruits or masses. Good bowel sounds. ?Extremities: no cyanosis, clubbing, rash, tr edema ?Neuro: alert & orientedx3, cranial nerves grossly intact. moves all 4 extremities w/o difficulty. Affect pleasant ? ?Echo with evidence of severe TR despite previous TVRing. There is also evidence of PH with RV strain. Will plan RHC today to further evaluate. Continue IV amio. Plan DC-CV of AF tomorrow. Continue Eliquis.  ? ?Glori Bickers, MD  ?10:29 AM ? ?  ?

## 2021-11-24 NOTE — Care Management Important Message (Signed)
Important Message ? ?Patient Details  ?Name: [name] ?MRN: [id number] ?Date of Birth: [birth year] ? ? ?Medicare Important Message Given:  Yes ? ? ? ? ?Shelda Altes ?11/24/2021, 8:48 AM ?

## 2021-11-24 NOTE — Progress Notes (Signed)
PT Cancellation Note ? ?Patient Details ?Name: [name] ?MRN: [id number] ?DOB: Jun 29, 1956 ? ? ?Cancelled Treatment:    Reason Eval/Treat Not Completed: Patient at procedure or test/unavailable (Pt in CATH lab. Will return tomorrow.) ? ? ?Alvira Philips ?11/24/2021, 10:26 AM ?Arrie Aran M,PT ?Acute Rehab Services ?[phone] ?[phone] (pager)  ? ? ?

## 2021-11-24 NOTE — Progress Notes (Signed)
?Progress Note ? ? ?PatientRhenda Gallagher TIR:[id number] DOB: 08/31/1955 DOA: 11/21/2021     2 ?DOS: the patient was seen and examined on 11/24/2021 ?  ?Brief hospital course: ?[name] was admitted to the hospital with the working diagnosis of decompensated heart failure. ? 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 Reported edema of arms and legs for 2 weeks, and dyspnea for 3 days into her hospitalization. Positive weight gain.  ?Frequent hospitalizations for heart failure, recent one on 08/2021, placed on daily torsemide and 3 time per week metolazone.  ? ?On her initial physical examination her blood pressure was 130/85, HR 142, RR 28, 02 saturation 99%, heart S1 and S2 present, irregularly irregular, tachycardic, lungs with no wheezing, but positive rales, abdomen soft and no lower extremity edema.  ? ?Na 139, K 3,6, CL 105, bicarbonate 24, glucose 147, BUN 16 and CR 2.0 ?BNP 534 ?Wbc 6.5 hgb 10,3 hct 33.0 plt 225  ?Sars covid 19 negative ? ?Chest radiograph with cardiomegaly, bilateral hilar vascular congestion, no infiltrates.  ? ?EKG 105 bpm, right axis deviation, Qtc 565, atrial fibrillation rhythm, with no significant ST segment changes, negative T wave lead I and AVL.  ? ?Patient placed on IV furosemide for diuresis.  ?04/06 biventricular failure with elevated pulmonary pressures and wedge pressure. ?Placed on inotropic support with milrinone.  ? ? ?Assessment and Plan: ?* Acute on chronic systolic CHF (congestive heart failure) ([place]) ?Echocardiogram with reduced LV systolic function 35 to 67% with global hypokinesis, interventricular septum is flattened and systole and diastole. Reduced RV systolic function, moderate. RVSP 46.9. sever dilatation of right and left atriums. Severe tricuspid valve regurgitation.  ? ?Pulmonary hypertension.  ? ?Urine output  over last 24 hrs is 625 ml ?Patient continue with volume overload.  ? ?Cardiac catheterization with PA 67/37, mean 45, PCW 30, with cardiac output 6,2 and index 2,5 ?Consistent with biventricular failure, with pulmonary hypertension. ? ?Plan to continue diuresis and added milrinone for inotropic support.  ? ? ?AF (paroxysmal atrial fibrillation) ([place]) ?Discontinue metoprolol and continue rate control with amiodarone driop. ?Continue anticoagulation with apizaban. ?Continue telemetry monitoring.  ? ?Acute kidney injury superimposed on chronic kidney disease ([place]) ?CKD stage 4, Hypokalemia. ? ?Patient continue with hypervolemia.  ?Renal function with serum cr at 2,63 with K at 4,1 and serum bicarbonate at 23. Mg 2.1  ?Continue diuresis and inotropic support.   ? ?Type 2 diabetes mellitus with hyperlipidemia ([place]) ?Fasting glucose is 167 ?Capillary has been 160, 147, 133.  ? ?Discontinue insulin therapy and check capillary glucose bid as needed.  ?Continue with rosuvastatin.  ? ?Acute mastoiditis, left ?Continue antibiotic therapy with Augmentin for 7 days.  ? ?Class 3 obesity ([place]) ?Calculated BMI is 51,9  ? ? ? ? ?  ? ?Subjective: patient continue to have dyspnea and edema.  ? ?Physical Exam: ?Vitals:  ? 11/24/21 1201 11/24/21 1206 11/24/21 1211 11/24/21 1230  ?BP: (!) 126/101 (!) 170/115 (!) 170/115 97/63  ?Pulse: 88 90 (!) 0 84  ?Resp:    20  ?Temp:    (!) 97.4 ?F (36.3 ?C)  ?TempSrc:    Oral  ?SpO2: 90%   99%  ?Weight:      ?Height:      ? ?Neurology awake and alert ?ENT with no pallor ?Cardiovascular with S1 and S2 present and rhythmic with no gallops,  rubs or murmurs ?Positive JVD ?Positive lower extremity edema non pitting ?Respiratory with decreased breath sounds at the dependent zones, no wheezing ?Abdomen protuberant but not distended  ?Data Reviewed: ? ? ? ?Family Communication: no family a the bedside  ? ?Disposition: ?Status is: Inpatient ?Remains inpatient appropriate because: heart failure  ? Planned  Discharge Destination: Home ? ?Author: ?Tawni Millers, MD ?11/24/2021 1:53 PM ? ?For on call review [email or web address].  ?

## 2021-11-25 ENCOUNTER — Inpatient Hospital Stay (HOSPITAL_COMMUNITY): Payer: 59 | Admitting: Anesthesiology

## 2021-11-25 ENCOUNTER — Encounter (HOSPITAL_BASED_OUTPATIENT_CLINIC_OR_DEPARTMENT_OTHER): Payer: Self-pay | Admitting: Cardiology

## 2021-11-25 ENCOUNTER — Other Ambulatory Visit: Payer: Self-pay

## 2021-11-25 ENCOUNTER — Encounter (HOSPITAL_COMMUNITY): Payer: Self-pay | Admitting: Internal Medicine

## 2021-11-25 ENCOUNTER — Encounter (HOSPITAL_COMMUNITY)
Admission: EM | Disposition: A | Discharge status: Home Health | Payer: Self-pay | Source: Home / Self Care | Attending: Internal Medicine

## 2021-11-25 DIAGNOSIS — I4891 Unspecified atrial fibrillation: Secondary | ICD-10-CM

## 2021-11-25 DIAGNOSIS — E1169 Type 2 diabetes mellitus with other specified complication: Secondary | ICD-10-CM | POA: Diagnosis not present

## 2021-11-25 DIAGNOSIS — I110 Hypertensive heart disease with heart failure: Secondary | ICD-10-CM

## 2021-11-25 DIAGNOSIS — H70002 Acute mastoiditis without complications, left ear: Secondary | ICD-10-CM | POA: Diagnosis not present

## 2021-11-25 DIAGNOSIS — I5023 Acute on chronic systolic (congestive) heart failure: Secondary | ICD-10-CM | POA: Diagnosis not present

## 2021-11-25 DIAGNOSIS — J449 Chronic obstructive pulmonary disease, unspecified: Secondary | ICD-10-CM

## 2021-11-25 DIAGNOSIS — N179 Acute kidney failure, unspecified: Secondary | ICD-10-CM | POA: Diagnosis not present

## 2021-11-25 DIAGNOSIS — I480 Paroxysmal atrial fibrillation: Secondary | ICD-10-CM | POA: Diagnosis not present

## 2021-11-25 HISTORY — PX: CARDIOVERSION: SHX1299

## 2021-11-25 LAB — CBC
HCT: 27.6 % — ABNORMAL LOW (ref 36.0–46.0)
Hemoglobin: 8.6 g/dL — ABNORMAL LOW (ref 12.0–15.0)
MCH: 28.8 pg (ref 26.0–34.0)
MCHC: 31.2 g/dL (ref 30.0–36.0)
MCV: 92.3 fL (ref 80.0–100.0)
Platelets: 204 10*3/uL (ref 150–400)
RBC: 2.99 MIL/uL — ABNORMAL LOW (ref 3.87–5.11)
RDW: 15.5 % (ref 11.5–15.5)
WBC: 6.6 10*3/uL (ref 4.0–10.5)
nRBC: 0 % (ref 0.0–0.2)

## 2021-11-25 LAB — BASIC METABOLIC PANEL
Anion gap: 7 (ref 5–15)
BUN: 33 mg/dL — ABNORMAL HIGH (ref 8–23)
CO2: 25 mmol/L (ref 22–32)
Calcium: 8.7 mg/dL — ABNORMAL LOW (ref 8.9–10.3)
Chloride: 105 mmol/L (ref 98–111)
Creatinine, Ser: 2.54 mg/dL — ABNORMAL HIGH (ref 0.44–1.00)
GFR, Estimated: 20 mL/min — ABNORMAL LOW (ref >60)
Glucose, Bld: 164 mg/dL — ABNORMAL HIGH (ref 70–99)
Potassium: 4.2 mmol/L (ref 3.5–5.1)
Sodium: 137 mmol/L (ref 135–145)

## 2021-11-25 LAB — GLUCOSE, CAPILLARY: Glucose-Capillary: 119 mg/dL — ABNORMAL HIGH (ref 70–99)

## 2021-11-25 SURGERY — CARDIOVERSION
Anesthesia: General

## 2021-11-25 MED ORDER — SODIUM CHLORIDE 0.9 % IV SOLN: INTRAVENOUS | Status: DC | PRN

## 2021-11-25 MED ORDER — SODIUM CHLORIDE 0.9 % IV SOLN: 510.0000 mg | Freq: Once | INTRAVENOUS | Status: DC

## 2021-11-25 MED ORDER — LIDOCAINE 2% (20 MG/ML) 5 ML SYRINGE
INTRAMUSCULAR | Status: DC | PRN
  Administered 2021-11-25: 40 mg via INTRAVENOUS

## 2021-11-25 MED ORDER — ETOMIDATE 2 MG/ML IV SOLN
INTRAVENOUS | Status: DC | PRN
  Administered 2021-11-25: 20 mg via INTRAVENOUS

## 2021-11-25 MED ORDER — METOLAZONE 2.5 MG PO TABS
2.5000 mg | ORAL_TABLET | Freq: Once | ORAL | Status: AC
  Administered 2021-11-25: 2.5 mg via ORAL
  Filled 2021-11-25: qty 1

## 2021-11-25 NOTE — Anesthesia Procedure Notes (Signed)
Procedure Name: General with mask airway ?Date/Time: 11/25/2021 1:36 PM ?Performed by: Reece Agar, CRNA ?Pre-anesthesia Checklist: Patient identified, Emergency Drugs available, Patient being monitored, Suction available and Timeout performed ?Patient Re-evaluated:Patient Re-evaluated prior to induction ?Oxygen Delivery Method: Ambu bag ?Preoxygenation: Pre-oxygenation with 100% oxygen ?Induction Type: IV induction ? ? ? ? ?

## 2021-11-25 NOTE — Transfer of Care (Signed)
Immediate Anesthesia Transfer of Care Note ? ?Patient: [name] ? ?Procedure(s) Performed: CARDIOVERSION ? ?Patient Location: Endoscopy Unit ? ?Anesthesia Type:General ? ?Level of Consciousness: drowsy ? ?Airway & Oxygen Therapy: Patient Spontanous Breathing ? ?Post-op Assessment: Report given to RN and Post -op Vital signs reviewed and stable ? ?Post vital signs: Reviewed and stable ? ?Last Vitals:  ?Vitals Value Taken Time  ?BP 148/78 11/25/21 1348  ?Temp    ?Pulse 81 11/25/21 1349  ?Resp 18 11/25/21 1349  ?SpO2 100 % 11/25/21 1349  ?Vitals shown include unvalidated device data. ? ?Last Pain:  ?Vitals:  ? 11/25/21 1303  ?TempSrc: Temporal  ?PainSc: 0-No pain  ?   ? ?Patients Stated Pain Goal: 3 (11/22/21 1813) ? ?Complications: No notable events documented. ?

## 2021-11-25 NOTE — Anesthesia Preprocedure Evaluation (Addendum)
Anesthesia Evaluation  ?Patient identified by MRN, date of birth, ID band ?Patient awake ? ? ? ?Reviewed: ?Allergy & Precautions, NPO status , Patient's Chart, lab work & pertinent test results ? ?History of Anesthesia Complications ?Negative for: history of anesthetic complications ? ?Airway ?Mallampati: III ? ?TM Distance: >3 FB ? ? ? ? Dental ?no notable dental hx. ?(+) Dental Advisory Given ?  ?Pulmonary ?shortness of breath, sleep apnea and Continuous Positive Airway Pressure Ventilation , COPD,  COPD inhaler,  ?  ?Pulmonary exam normal ? ? ? ? ? ? ? Cardiovascular ?hypertension, (-) angina+CHF  ?+ dysrhythmias Atrial Fibrillation  ?Rhythm:Irregular Rate:Normal ? ?02/2021 ECHO: EF 40 to 45%. The LV has mildly decreased function, global hypokinesis. There is mild concentric LVH, mildly reduced RV function, s/p MVR, mild MR, mod TR ?  ?Neuro/Psych ? Headaches, Depression   ? GI/Hepatic ?Neg liver ROS, GERD  Controlled,  ?Endo/Other  ?diabetes, Type 2, Insulin DependentMorbid obesity ? Renal/GU ?Renal InsufficiencyRenal disease  ? ?  ?Musculoskeletal ? ? Abdominal ?(+) + obese,   ?Peds ? Hematology ? ?(+) Blood dyscrasia (Hb 10.5), anemia , eliquis   ?Anesthesia Other Findings ? ? Reproductive/Obstetrics ? ?  ? ? ? ? ? ? ? ? ? ? ? ? ? ?  ?  ? ? ? ? ? ? ?Anesthesia Physical ? ?Anesthesia Plan ? ?ASA: 3 ? ?Anesthesia Plan: General  ? ?Post-op Pain Management:   ? ?Induction: Intravenous ? ?PONV Risk Score and Plan: 3 and Ondansetron and Dexamethasone ? ?Airway Management Planned: Mask ? ?Additional Equipment: None ? ?Intra-op Plan:  ? ?Post-operative Plan:  ? ?Informed Consent: I have reviewed the patients History and Physical, chart, labs and discussed the procedure including the risks, benefits and alternatives for the proposed anesthesia with the patient or authorized representative who has indicated his/her understanding and acceptance.  ? ? ? ?Dental advisory given ? ?Plan  Discussed with: Anesthesiologist and CRNA ? ?Anesthesia Plan Comments:   ? ? ? ? ? ?Anesthesia Quick Evaluation ? ?

## 2021-11-25 NOTE — Progress Notes (Signed)
Patient taken for cardioversion via wheelchair. Transported by endo nurse. ?

## 2021-11-25 NOTE — Progress Notes (Signed)
Physical Therapy Treatment ?Patient Details ?Name: [name] ?MRN: [id number] ?DOB: 13-Jun-1956 ?Today's Date: 11/25/2021 ? ? ?History of Present Illness This 66 y.o. female presenting on 4/3 with SOB and chest pain.  Found with acute on chronic systolic CHF.  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 ? ?  ?PT Comments  ? ? The pt was agreeable to session with focus on progressing ambulation and mobility. Able to complete sit-stand transfers and gait with minG for safety and use of rollator. She did need 1-2 min seated rest after ~75 ft ambulation due to SOB, but maintained good stability and states her gait feels close to her baseline. Will continue to benefit from skilled PT to progress endurance and LE power.  ?   ?Recommendations for follow up therapy are one component of a multi-disciplinary discharge planning process, led by the attending physician.  Recommendations may be updated based on patient status, additional functional criteria and insurance authorization. ? ?Follow Up Recommendations ? Home health PT ?  ?  ?Assistance Recommended at Discharge Frequent or constant Supervision/Assistance  ?Patient can return home with the following A little help with bathing/dressing/bathroom;Help with stairs or ramp for entrance;Assist for transportation;Assistance with cooking/housework;A little help with walking and/or transfers ?  ?Equipment Recommendations ? None recommended by PT  ?  ?Recommendations for Other Services   ? ? ?  ?Precautions / Restrictions Precautions ?Precautions: Fall ?Precaution Comments: watch HR ?Restrictions ?Weight Bearing Restrictions: No  ?  ? ?Mobility ? Bed Mobility ?Overal bed mobility: Needs Assistance ?Bed Mobility: Supine to Sit, Sit to Sidelying, Rolling ?Rolling: Supervision ?  ?Supine to  sit: Min guard ?  ?Sit to sidelying: Min assist ?General bed mobility comments: minA to LE to return to bed, then able to reposition without assist. slightly increased time to come to sitting EOB ?  ? ?Transfers ?Overall transfer level: Needs assistance ?Equipment used: Rollator (4 wheels) ?Transfers: Sit to/from Stand ?Sit to Stand: Min guard ?  ?  ?  ?  ?  ?General transfer comment: minG for safety, cues for hand placement on rollator, stable with rise from EOB ?  ? ?Ambulation/Gait ?Ambulation/Gait assistance: Min guard ?Gait Distance (Feet): 75 Feet (+ 75 ft) ?Assistive device: Rollator (4 wheels) ?Gait Pattern/deviations: Step-through pattern, Decreased stride length, Trunk flexed ?Gait velocity: 0.42 m/s ?Gait velocity interpretation: <1.31 ft/sec, indicative of household ambulator ?  ?General Gait Details: seated rest after 75 ft due to SOB, VSS. after 2 min rest, pt returned to room with steady gait. increased lateral sway as natural gait ? ? ? ?  ?Balance Overall balance assessment: Needs assistance ?Sitting-balance support: No upper extremity supported, Feet supported ?Sitting balance-Leahy Scale: Fair ?  ?  ?Standing balance support: Bilateral upper extremity supported, During functional activity ?Standing balance-Leahy Scale: Poor ?Standing balance comment: relies on BUE support, min guard for safety while turning around to sit on rollator seat ?  ?  ?  ?  ?  ?  ?  ?  ?  ?  ?  ?  ? ?  ?Cognition Arousal/Alertness: Awake/alert ?Behavior During Therapy: [facility] for tasks assessed/performed ?Overall Cognitive Status: Within Functional Limits for tasks assessed ?  ?  ?  ?  ?  ?  ?  ?  ?  ?  ?  ?  ?  ?  ?  ?  ?  General Comments: pt likes to have a heads-up on the plan prior to initiating mobility; cooperative ?  ?  ? ?  ?Exercises   ? ?  ?General Comments   ?  ?  ? ?Pertinent Vitals/Pain Pain Assessment ?Pain Assessment: No/denies pain  ? ? ? ?PT Goals (current goals can now be found in the care plan section)  Acute Rehab PT Goals ?Patient Stated Goal: to feel better ?PT Goal Formulation: With patient ?Time For Goal Achievement: 12/06/21 ?Potential to Achieve Goals: Good ?Progress towards PT goals: Progressing toward goals ? ?  ?Frequency ? ? ? Min 3X/week ? ? ? ?  ?PT Plan Current plan remains appropriate  ? ? ?   ?AM-PAC PT "6 Clicks" Mobility   ?Outcome Measure ? Help needed turning from your back to your side while in a flat bed without using bedrails?: A Little ?Help needed moving from lying on your back to sitting on the side of a flat bed without using bedrails?: A Little ?Help needed moving to and from a bed to a chair (including a wheelchair)?: A Little ?Help needed standing up from a chair using your arms (e.g., wheelchair or bedside chair)?: A Little ?Help needed to walk in hospital room?: A Little ?Help needed climbing 3-5 steps with a railing? : A Lot ?6 Click Score: 17 ? ?  ?End of Session Equipment Utilized During Treatment: Gait belt ?Activity Tolerance: Patient tolerated treatment well;Patient limited by fatigue ?Patient left: in bed;with call bell/phone within reach;with bed alarm set;with family/visitor present ?Nurse Communication: Mobility status ?PT Visit Diagnosis: Other abnormalities of gait and mobility (R26.89);Unsteadiness on feet (R26.81);Muscle weakness (generalized) (M62.81) ?  ? ? ?Time: 4287-6811 ?PT Time Calculation (min) (ACUTE ONLY): 19 min ? ?Charges:  $Therapeutic Exercise: 8-22 mins          ?          ? ?West Carbo, PT, DPT  ? ?Acute Rehabilitation Department ?Pager #: [phone] - [phone] ? ? ?[name] ?11/25/2021, 5:20 PM ? ?

## 2021-11-25 NOTE — Progress Notes (Signed)
?Progress Note ? ? ?PatientAlliyah Gallagher EXB:[id number] DOB: 1955-12-07 DOA: 11/21/2021     3 ?DOS: the patient was seen and examined on 11/25/2021 ?  ?Brief hospital course: ?[name] was admitted to the hospital with the working diagnosis of decompensated heart failure. ? 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 Reported edema of arms and legs for 2 weeks, and dyspnea for 3 days into her hospitalization. Positive weight gain.  ?Frequent hospitalizations for heart failure, recent one on 08/2021, placed on daily torsemide and 3 time per week metolazone.  ? ?On her initial physical examination her blood pressure was 130/85, HR 142, RR 28, 02 saturation 99%, heart S1 and S2 present, irregularly irregular, tachycardic, lungs with no wheezing, but positive rales, abdomen soft and no lower extremity edema.  ? ?Na 139, K 3,6, CL 105, bicarbonate 24, glucose 147, BUN 16 and CR 2.0 ?BNP 534 ?Wbc 6.5 hgb 10,3 hct 33.0 plt 225  ?Sars covid 19 negative ? ?Chest radiograph with cardiomegaly, bilateral hilar vascular congestion, no infiltrates.  ? ?EKG 105 bpm, right axis deviation, Qtc 565, atrial fibrillation rhythm, with no significant ST segment changes, negative T wave lead I and AVL.  ? ?Patient placed on IV furosemide for diuresis.  ?04/06 biventricular failure with elevated pulmonary pressures and wedge pressure. ?Placed on inotropic support with milrinone.  ? ? ?Assessment and Plan: ?* Acute on chronic systolic CHF (congestive heart failure) ([place]) ?Echocardiogram with reduced LV systolic function 35 to 10% with global hypokinesis, interventricular septum is flattened and systole and diastole. Reduced RV systolic function, moderate. RVSP 46.9. sever dilatation of right and left atriums. Severe tricuspid valve regurgitation.  ? ?Cardiac catheterization with PA 67/37,  mean 45, PCW 30, with cardiac output 6,2 and index 2,5 ?Consistent with biventricular failure, with pulmonary hypertension ?Pulmonary hypertension.  ? ?Urine output over last 24 hrs is 1,850 ml ?Systolic blood pressure 272 to 118 mmHg.  ? ?Continue diuresis with furosemide drip (20 mg/hr) and inotropic support with milrinone.  ? ? ?AF (paroxysmal atrial fibrillation) ([place]) ?Continue rate control with amiodarone infusion.  ?Continue anticoagulation with apixaban ? ?Heart rate has been in th 90 range. ?Continue telemetry monitoring.  ? ?Acute kidney injury superimposed on chronic kidney disease ([place]) ?CKD stage 4, Hypokalemia. ? ?Continue with significant volume overload. ?Renal function today with serum cr at 2,54 with K at 4,2 and serum bicarbonate at 25.  ?Plan to continue diuresis with furosemide infusion and follow up renal function in am. ?Check Mg.  ? ?Type 2 diabetes mellitus with hyperlipidemia ([place]) ?Fasting glucose is 164 ? ?Patient is tolerating po well, continue to hold on insulin therapy.  ?Check capillary glucose as needed bid.  ? ?Continue with statin therapy.  ? ?Acute mastoiditis, left ?Continue antibiotic therapy with Augmentin for 7 days.  ? ?Class 3 obesity ([place]) ?Calculated BMI is 51,9  ? ? ? ? ?  ? ?Subjective: patient continue to have edema and dyspnea.  ? ?Physical Exam: ?Vitals:  ? 11/24/21 2014 11/25/21 0550 11/25/21 0559 11/25/21 1056  ?BP: 133/72 120/67  118/62  ?Pulse: 84 88  91  ?Resp: '20 16  18  '$ ?Temp: 97.6 ?F (36.4 ?C)  97.7 ?F (36.5 ?C) (!) 97.5 ?F (36.4 ?C)  ?TempSrc: Oral   Oral  ?SpO2: 100% 99%  100%  ?Weight:   (!) 139 kg   ?  Height:      ? ?Neurology awake and alert ?ENT with no pallor ?Cardiovascular with S1 and S2 present irregularly irregular with no gallops or rubs, no murmurs ?Positive JVD ?Positive lower extremity ?Respiratory with no rales or wheezing ?Abdomen not distended  ?Data Reviewed: ? ? ? ?Family Communication: no family at the bedside  ? ?Disposition: ?Status is:  Inpatient ?Remains inpatient appropriate because: heart failure  ? Planned Discharge Destination: Home ? ?Author: ?Tawni Millers, MD ?11/25/2021 12:55 PM ? ?For on call review [email or web address].  ?

## 2021-11-25 NOTE — CV Procedure (Signed)
? ?   DIRECT CURRENT CARDIOVERSION ? ?NAME:  [name]   MRN: [id number] ?DOB:  May 06, 1956   ADMIT DATE: 11/21/2021 ? ? ?INDICATIONS: Atrial fibrillation  ? ? ?PROCEDURE:  ? ?Informed consent was obtained prior to the procedure. The risks, benefits and alternatives for the procedure were discussed and the patient comprehended these risks. Once an appropriate time out was taken, the patient had the defibrillator pads placed in the anterior and posterior position. The patient then underwent sedation by the anesthesia service. Once an appropriate level of sedation was achieved, the patient received a single biphasic, synchronized 200J shock with prompt conversion to sinus rhythm. No apparent complications. ? ?Glori Bickers, MD  ?1:43 PM ? ?

## 2021-11-25 NOTE — Progress Notes (Addendum)
? ? Advanced Heart Failure Rounding Note ? ?PCP-Cardiologist: Buford Dresser, MD  ? ?Subjective:   ? ?04/03: Admitted with A fib RVR and A/C HFrEF.  Started on amio drip. Diuresing with IV lasix.   ?04/06: RHC with severely elevated biventricular pressures. Severe RV failure. Started milrinone 0.125 + lasix gtt  ? ?1.8L UOP + unmeasured voids charted yesterday, negative 500 cc last 24 hr. Weight unchanged. ? ?Scr 2.09>2.46>2.63>2.54 ? ?Dyspneic with minimal activity. Orthopnea overnight. Abdomen feels distended. ? ?Remains in AF 80s-100s, on amio at 30/hr ? ?Objective:   ?Weight Range: ?(!) 139 kg ?Body mass index is 48 kg/m?.  ? ?Vital Signs:   ?Temp:  [97.4 ?F (36.3 ?C)-97.7 ?F (36.5 ?C)] 97.7 ?F (36.5 ?C) (04/07 0559) ?Pulse Rate:  [0-147] 88 (04/07 0550) ?Resp:  [16-28] 16 (04/07 0550) ?BP: (87-182)/(27-123) 120/67 (04/07 0550) ?SpO2:  [76 %-100 %] 99 % (04/07 0550) ?Weight:  [138.8 kg-139 kg] 139 kg (04/07 0559) ?Last BM Date : 11/23/21 ? ?Weight change: ?Filed Weights  ? 11/24/21 0413 11/24/21 0900 11/25/21 0559  ?Weight: (!) 151.8 kg (!) 138.8 kg (!) 139 kg  ? ? ?Intake/Output:  ? ?Intake/Output Summary (Last 24 hours) at 11/25/2021 0810 ?Last data filed at 11/25/2021 0102 ?Gross per 24 hour  ?Intake 1066.31 ml  ?Output 1850 ml  ?Net -783.69 ml  ?  ? ? ?Physical Exam  ?  ?General:  Lying in bed. No distress. ?HEENT: normal ?Neck: supple. JVP to jaw. Carotids 2+ bilat; no bruits.  ?Cor: PMI nondisplaced. Irregular rate & rhythm. No rubs, gallops, 3/6 TRM murmur ?Lungs: clear ?Abdomen: obese, soft, nontender, nondistended. No hepatosplenomegaly.  ?Extremities: no cyanosis, clubbing, rash, 1+ edema ?Neuro: alert & orientedx3, cranial nerves grossly intact. moves all 4 extremities w/o difficulty. Affect pleasant ? ? ? ?Telemetry  ? ?AF 80s-100s, 1-2 PVCs/min (personally reviewed) ? ? ?Labs  ?  ?CBC ?Recent Labs  ?  11/24/21 ?0433 11/24/21 ?1152 11/24/21 ?1153 11/25/21 ?0300  ?WBC 6.7  --   --  6.6  ?HGB 9.4*    < > 10.9* 8.6*  ?HCT 30.0*   < > 32.0* 27.6*  ?MCV 92.6  --   --  92.3  ?PLT 256  --   --  204  ? < > = values in this interval not displayed.  ? ?Basic Metabolic Panel ?Recent Labs  ?  11/23/21 ?0416 11/24/21 ?0433 11/24/21 ?1152 11/24/21 ?1153 11/25/21 ?0300  ?NA 137 137   < > 139 137  ?K 3.4* 4.1   < > 4.6 4.2  ?CL 105 107  --   --  105  ?CO2 23 23  --   --  25  ?GLUCOSE 128* 167*  --   --  164*  ?BUN 24* 28*  --   --  33*  ?CREATININE 2.46* 2.63*  --   --  2.54*  ?CALCIUM 8.8* 8.9  --   --  8.7*  ?MG 2.1 2.1  --   --   --   ? < > = values in this interval not displayed.  ? ?Liver Function Tests ?No results for input(s): AST, ALT, ALKPHOS, BILITOT, PROT, ALBUMIN in the last 72 hours. ? ?No results for input(s): LIPASE, AMYLASE in the last 72 hours. ? ?Cardiac Enzymes ?No results for input(s): CKTOTAL, CKMB, CKMBINDEX, TROPONINI in the last 72 hours. ? ? ?BNP: ?BNP (last 3 results) ?Recent Labs  ?  08/31/21 ?0114 09/23/21 ?1554 11/21/21 ?1122  ?BNP 132.7* 31.1 534.8*  ? ? ?  ProBNP (last 3 results) ?No results for input(s): PROBNP in the last 8760 hours. ? ? ?D-Dimer ?No results for input(s): DDIMER in the last 72 hours. ? ?Hemoglobin A1C ?No results for input(s): HGBA1C in the last 72 hours. ? ?Fasting Lipid Panel ?No results for input(s): CHOL, HDL, LDLCALC, TRIG, CHOLHDL, LDLDIRECT in the last 72 hours. ?Thyroid Function Tests ?No results for input(s): TSH, T4TOTAL, T3FREE, THYROIDAB in the last 72 hours. ? ?Invalid input(s): FREET3 ? ? ?Other results: ? ? ?Imaging  ? ? ?CARDIAC CATHETERIZATION ? ?Result Date: 11/24/2021 ?Findings: RA = 29 RV = 65/30 PA = 67/37 (45) PCW = 30 (v waves to 45) Fick cardiac output/index = 6.2/2.5 PVR = 2.4 WU Ao sat = 93% PA sat = 50%, 55% PAPi = 1.0 Assessment: 1. Severely elevated biventricular pressures 2. Moderate pulmonary venous HTN with marked v-waves in PCWP tracing 3. Severe RV failure Plan/Discussion: Add milrinone. Continue diuresis. Glori Bickers, MD 12:11 PM   ? ? ?Medications:   ? ? ?Scheduled Medications: ? (feeding supplement) PROSource Plus  30 mL Oral BID BM  ? amoxicillin-clavulanate  1 tablet Oral Q12H  ? apixaban  5 mg Oral BID  ? busPIRone  5 mg Oral BID  ? colchicine  0.3 mg Oral Daily  ? DULoxetine  60 mg Oral Daily  ? loratadine  10 mg Oral Daily  ? multivitamin with minerals  1 tablet Oral Daily  ? pantoprazole  40 mg Oral Daily  ? potassium chloride  40 mEq Oral BID  ? rosuvastatin  10 mg Oral QPM  ? sodium chloride flush  3 mL Intravenous Q12H  ? sodium chloride flush  3 mL Intravenous Q12H  ? sodium chloride flush  3 mL Intravenous Q12H  ? ? ?Infusions: ? sodium chloride    ? sodium chloride 20 mL/hr at 11/25/21 0803  ? sodium chloride    ? amiodarone 30 mg/hr (11/25/21 0556)  ? furosemide (LASIX) 200 mg in dextrose 5% 100 mL ('2mg'$ /mL) infusion 10 mg/hr (11/24/21 1829)  ? milrinone 0.125 mcg/kg/min (11/25/21 0551)  ? ? ?PRN Medications: ?sodium chloride, sodium chloride, acetaminophen **OR** acetaminophen, albuterol, diphenhydrAMINE, HYDROcodone-acetaminophen, sodium chloride flush, sodium chloride flush ? ? ? ?Patient Profile  ?66 y.o. female with history of morbid obesity, chronic diastolic and systolic HF with mildly reduced LVEF (45-50%), severe MR/TR, atrial fibrillation on Eliquis, HTN, and DMII.  ? ?Admitted with A Fib RVR and A/C HFrEF.  ? ? ? ?Assessment/Plan  ?1. A fib RVR ?- She has failed Tikosyn ?- Maze in 5/22 and DCCV in 6/22 & 04/19/21, last of which she had ERAF. ?- Saw Dr Quentin Ore in February, discussed AV nodal ablation + CRT if recurrent atrial arrhythmias ?- Now on amio gtt at 30/hr. Rate okay, 80s-100s  ?- Continue eliquis 5 mg twice a day.  ?- DCCV planned for today. Hopefully restoring SR will help with managing volume. ?  ?2. A/C HFrEF,NICM ?- RHC (5/22): elevated R>L filling pressures with PAPi low, suggestive of significant component of RV dysfunction, prominent v-waves in PCWP and RA tracings suggestive of MR/TR, CO low ?- RHC  (6/22) with low filling pressures with moderately reduced CO. ?- RHC 07/2021 normal filling pressures and moderately reduced cardiac output.  ?- Echo (7/22) in setting of AFL with RVR, showed EF 30-35%, moderate LVH, moderate RV dysfunction, PASP 48, s/p MV repair with mild MR and mean gradient 10 mmHg with HR around 130, s/p TV repair with mild-moderate TR, dilated IVC.  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  ?- Echo 11/22/21: EF 30-35% RV moderately reduced, RVSP 47 mmHg, severe BAE, mild MS, severe TR despite TV repair ?- RHC 11/24/21: RA 29, PA 67/37 (45), PCW 30 ( v waves to 45), Fick CO/CI 6.2/2.5, PA sat 50%, 55%, PAPi 1.0. Severely elevated biventricular pressures, moderate pulmonary hypertension with marked V waves in PCWP tracing and severe RV failure. ?- Suspect AF contributing to decompensation. Attempting DCCV today as above. ?- Now on milrinone 0.125 + lasix gtt at 10/hr. Diuresis not very brisk overnight. Increase lasix gtt to 15/hr and give 2.5 mg metolazone. ?- GDMT limited by AKI on CKD, eventually consider spiro and SGLT2i ?- Apply TED hose ?- CR ? ?Valvular Heart Disease ?-S/p MV and TV repair in 5/22 ?-Echo 04/23: EF 30-35%, RV moderately reduced, severe TR ?-RHC as above. Severely elevated biventricular pressures, moderate pulmonary hypertension and severe RV failure. PAPi 1.0. ?-Not sure if there are any options for repair ? ? CAD/Chest pain ?- trivial CAD on heart cath 2019 ?- CP is chronic for her.  ?- hstrop negative. Doubt ischemic ?  ?CKD Stage IV ?- baseline Scr 2-2.5 ?- Scr 2.09>2.46.2.63>2.54 ?- Hopefully renal function improves with diuresis, daily BMET ?  ?DMII ?- per primary  ?- consider SGLT2i as renal function stabilizes ?  ?Morbid Obesity  ?- needs weight loss ?- May be candidate for GLP1RA ? ?Anemia ?- Baseline 10-12, Hgb 10.3>9.5>9.8>8.6 ?- Monitor. NO overt bleeding. ?- Iron stores ok ? ? ?Length of Stay: 3 ? ?FINCH, LINDSAY N, PA-C  ?11/25/2021, 8:10 AM ? ?Advanced Heart Failure  Team ?Pager [phone] (M-F; 7a - 5p)  ?Please contact [facility] for night-coverage after hours (5p -7a ) and weekends on [email or web address] ? ? ?Patient seen and examined with the above-signed Advanced Practice Prov

## 2021-11-25 NOTE — H&P (View-Only) (Signed)
? ? Advanced Heart Failure Rounding Note ? ?PCP-Cardiologist: Buford Dresser, MD  ? ?Subjective:   ? ?04/03: Admitted with A fib RVR and A/C HFrEF.  Started on amio drip. Diuresing with IV lasix.   ?04/06: RHC with severely elevated biventricular pressures. Severe RV failure. Started milrinone 0.125 + lasix gtt  ? ?1.8L UOP + unmeasured voids charted yesterday, negative 500 cc last 24 hr. Weight unchanged. ? ?Scr 2.09>2.46>2.63>2.54 ? ?Dyspneic with minimal activity. Orthopnea overnight. Abdomen feels distended. ? ?Remains in AF 80s-100s, on amio at 30/hr ? ?Objective:   ?Weight Range: ?(!) 139 kg ?Body mass index is 48 kg/m?.  ? ?Vital Signs:   ?Temp:  [97.4 ?F (36.3 ?C)-97.7 ?F (36.5 ?C)] 97.7 ?F (36.5 ?C) (04/07 0559) ?Pulse Rate:  [0-147] 88 (04/07 0550) ?Resp:  [16-28] 16 (04/07 0550) ?BP: (87-182)/(27-123) 120/67 (04/07 0550) ?SpO2:  [76 %-100 %] 99 % (04/07 0550) ?Weight:  [138.8 kg-139 kg] 139 kg (04/07 0559) ?Last BM Date : 11/23/21 ? ?Weight change: ?Filed Weights  ? 11/24/21 0413 11/24/21 0900 11/25/21 0559  ?Weight: (!) 151.8 kg (!) 138.8 kg (!) 139 kg  ? ? ?Intake/Output:  ? ?Intake/Output Summary (Last 24 hours) at 11/25/2021 0810 ?Last data filed at 11/25/2021 7253 ?Gross per 24 hour  ?Intake 1066.31 ml  ?Output 1850 ml  ?Net -783.69 ml  ?  ? ? ?Physical Exam  ?  ?General:  Lying in bed. No distress. ?HEENT: normal ?Neck: supple. JVP to jaw. Carotids 2+ bilat; no bruits.  ?Cor: PMI nondisplaced. Irregular rate & rhythm. No rubs, gallops, 3/6 TRM murmur ?Lungs: clear ?Abdomen: obese, soft, nontender, nondistended. No hepatosplenomegaly.  ?Extremities: no cyanosis, clubbing, rash, 1+ edema ?Neuro: alert & orientedx3, cranial nerves grossly intact. moves all 4 extremities w/o difficulty. Affect pleasant ? ? ? ?Telemetry  ? ?AF 80s-100s, 1-2 PVCs/min (personally reviewed) ? ? ?Labs  ?  ?CBC ?Recent Labs  ?  11/24/21 ?0433 11/24/21 ?1152 11/24/21 ?1153 11/25/21 ?0300  ?WBC 6.7  --   --  6.6  ?HGB 9.4*    < > 10.9* 8.6*  ?HCT 30.0*   < > 32.0* 27.6*  ?MCV 92.6  --   --  92.3  ?PLT 256  --   --  204  ? < > = values in this interval not displayed.  ? ?Basic Metabolic Panel ?Recent Labs  ?  11/23/21 ?0416 11/24/21 ?0433 11/24/21 ?1152 11/24/21 ?1153 11/25/21 ?0300  ?NA 137 137   < > 139 137  ?K 3.4* 4.1   < > 4.6 4.2  ?CL 105 107  --   --  105  ?CO2 23 23  --   --  25  ?GLUCOSE 128* 167*  --   --  164*  ?BUN 24* 28*  --   --  33*  ?CREATININE 2.46* 2.63*  --   --  2.54*  ?CALCIUM 8.8* 8.9  --   --  8.7*  ?MG 2.1 2.1  --   --   --   ? < > = values in this interval not displayed.  ? ?Liver Function Tests ?No results for input(s): AST, ALT, ALKPHOS, BILITOT, PROT, ALBUMIN in the last 72 hours. ? ?No results for input(s): LIPASE, AMYLASE in the last 72 hours. ? ?Cardiac Enzymes ?No results for input(s): CKTOTAL, CKMB, CKMBINDEX, TROPONINI in the last 72 hours. ? ? ?BNP: ?BNP (last 3 results) ?Recent Labs  ?  08/31/21 ?0114 09/23/21 ?1554 11/21/21 ?1122  ?BNP 132.7* 31.1 534.8*  ? ? ?  ProBNP (last 3 results) ?No results for input(s): PROBNP in the last 8760 hours. ? ? ?D-Dimer ?No results for input(s): DDIMER in the last 72 hours. ? ?Hemoglobin A1C ?No results for input(s): HGBA1C in the last 72 hours. ? ?Fasting Lipid Panel ?No results for input(s): CHOL, HDL, LDLCALC, TRIG, CHOLHDL, LDLDIRECT in the last 72 hours. ?Thyroid Function Tests ?No results for input(s): TSH, T4TOTAL, T3FREE, THYROIDAB in the last 72 hours. ? ?Invalid input(s): FREET3 ? ? ?Other results: ? ? ?Imaging  ? ? ?CARDIAC CATHETERIZATION ? ?Result Date: 11/24/2021 ?Findings: RA = 29 RV = 65/30 PA = 67/37 (45) PCW = 30 (v waves to 45) Fick cardiac output/index = 6.2/2.5 PVR = 2.4 WU Ao sat = 93% PA sat = 50%, 55% PAPi = 1.0 Assessment: 1. Severely elevated biventricular pressures 2. Moderate pulmonary venous HTN with marked v-waves in PCWP tracing 3. Severe RV failure Plan/Discussion: Add milrinone. Continue diuresis. Glori Bickers, MD 12:11 PM   ? ? ?Medications:   ? ? ?Scheduled Medications: ? (feeding supplement) PROSource Plus  30 mL Oral BID BM  ? amoxicillin-clavulanate  1 tablet Oral Q12H  ? apixaban  5 mg Oral BID  ? busPIRone  5 mg Oral BID  ? colchicine  0.3 mg Oral Daily  ? DULoxetine  60 mg Oral Daily  ? loratadine  10 mg Oral Daily  ? multivitamin with minerals  1 tablet Oral Daily  ? pantoprazole  40 mg Oral Daily  ? potassium chloride  40 mEq Oral BID  ? rosuvastatin  10 mg Oral QPM  ? sodium chloride flush  3 mL Intravenous Q12H  ? sodium chloride flush  3 mL Intravenous Q12H  ? sodium chloride flush  3 mL Intravenous Q12H  ? ? ?Infusions: ? sodium chloride    ? sodium chloride 20 mL/hr at 11/25/21 0803  ? sodium chloride    ? amiodarone 30 mg/hr (11/25/21 0556)  ? furosemide (LASIX) 200 mg in dextrose 5% 100 mL ('2mg'$ /mL) infusion 10 mg/hr (11/24/21 1829)  ? milrinone 0.125 mcg/kg/min (11/25/21 0551)  ? ? ?PRN Medications: ?sodium chloride, sodium chloride, acetaminophen **OR** acetaminophen, albuterol, diphenhydrAMINE, HYDROcodone-acetaminophen, sodium chloride flush, sodium chloride flush ? ? ? ?Patient Profile  ?66 y.o. female with history of morbid obesity, chronic diastolic and systolic HF with mildly reduced LVEF (45-50%), severe MR/TR, atrial fibrillation on Eliquis, HTN, and DMII.  ? ?Admitted with A Fib RVR and A/C HFrEF.  ? ? ? ?Assessment/Plan  ?1. A fib RVR ?- She has failed Tikosyn and amiodarone. ?- Maze in 5/22 and DCCV in 6/22 & 04/19/21, last of which she had ERAF. ?- Saw Dr Quentin Ore in February, discussed AV nodal ablation + CRT if recurrent atrial arrhythmias ?- Now on amio gtt at 30/hr. Rate okay, 80s-100s  ?- Continue eliquis 5 mg twice a day.  ?- DCCV planned for today. Hopefully restoring SR will help with managing volume. ?  ?2. A/C HFrEF,NICM ?- RHC (5/22): elevated R>L filling pressures with PAPi low, suggestive of significant component of RV dysfunction, prominent v-waves in PCWP and RA tracings suggestive of MR/TR,  CO low ?- RHC (6/22) with low filling pressures with moderately reduced CO. ?- RHC 07/2021 normal filling pressures and moderately reduced cardiac output.  ?- Echo (7/22) in setting of AFL with RVR, showed EF 30-35%, moderate LVH, moderate RV dysfunction, PASP 48, s/p MV repair with mild MR and mean gradient 10 mmHg with HR around 130, s/p TV repair with mild-moderate TR, dilated  IVC.  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  ?- Echo 11/22/21: EF 30-35% RV moderately reduced, RVSP 47 mmHg, severe BAE, mild MS, severe TR despite TV repair ?- RHC 11/24/21: RA 29, PA 67/37 (45), PCW 30 ( v waves to 45), Fick CO/CI 6.2/2.5, PA sat 50%, 55%, PAPi 1.0. Severely elevated biventricular pressures, moderate pulmonary hypertension with marked V waves in PCWP tracing and severe RV failure. ?- Suspect AF contributing to decompensation. Attempting DCCV today as above. ?- Now on milrinone 0.125 + lasix gtt at 10/hr. Diuresis not very brisk overnight. Increase lasix gtt to 15/hr and give 2.5 mg metolazone. ?- GDMT limited by AKI on CKD, eventually consider spiro and SGLT2i ?- Apply TED hose ?- CR ? ?Valvular Heart Disease ?-S/p MV and TV repair in 5/22 ?-Echo 04/23: EF 30-35%, RV moderately reduced, severe TR ?-RHC as above. Severely elevated biventricular pressures, moderate pulmonary hypertension and severe RV failure. PAPi 1.0. ?-Not sure if there are any options for repair ? ? CAD/Chest pain ?- trivial CAD on heart cath 2019 ?- CP is chronic for her.  ?- hstrop negative. Doubt ischemic ?  ?CKD Stage IV ?- baseline Scr 2-2.5 ?- Scr 2.09>2.46.2.63>2.54 ?- Hopefully renal function improves with diuresis, daily BMET ?  ?DMII ?- per primary  ?- consider SGLT2i as renal function stabilizes ?  ?Morbid Obesity  ?- needs weight loss ?- May be candidate for GLP1RA ? ?Anemia ?- Baseline 10-12, Hgb 10.3>9.5>9.8>8.6 ?- Monitor. NO overt bleeding. ?- Iron stores low. Give feraheme once off abx for mastoiditis ? ? ?Length of Stay: 3 ? ?FINCH,  LINDSAY N, PA-C  ?11/25/2021, 8:10 AM ? ?Advanced Heart Failure Team ?Pager [phone] (M-F; 7a - 5p)  ?Please contact [facility] for night-coverage after hours (5p -7a ) and weekends on [email or web address] ? ? ?Patient s

## 2021-11-25 NOTE — Anesthesia Postprocedure Evaluation (Signed)
Anesthesia Post Note ? ?Patient: [name] ? ?Procedure(s) Performed: CARDIOVERSION ? ?  ? ?Patient location during evaluation: Endoscopy ?Anesthesia Type: General ?Level of consciousness: awake and alert, patient cooperative and oriented ?Pain management: pain level controlled ?Vital Signs Assessment: post-procedure vital signs reviewed and stable ?Respiratory status: spontaneous breathing, nonlabored ventilation and respiratory function stable ?Cardiovascular status: blood pressure returned to baseline and stable ?Postop Assessment: no apparent nausea or vomiting and able to ambulate ?Anesthetic complications: no ? ? ?No notable events documented. ? ?Last Vitals:  ?Vitals:  ? 11/25/21 1355 11/25/21 1409  ?BP: 135/83 136/70  ?Pulse: 81 81  ?Resp: 19 17  ?Temp:    ?SpO2: 95% 100%  ?  ?Last Pain:  ?Vitals:  ? 11/25/21 1409  ?TempSrc:   ?PainSc: 0-No pain  ? ? ?  ?  ?  ?  ?  ?  ? ?Camrin Lapre,E. Kyair Ditommaso ? ? ? ? ?

## 2021-11-25 NOTE — Interval H&P Note (Signed)
History and Physical Interval Note: ? ?11/25/2021 ?1:42 PM ? ?[name]  has presented today for surgery, with the diagnosis of A fib.  The various methods of treatment have been discussed with the patient and family. After consideration of risks, benefits and other options for treatment, the patient has consented to  Procedure(s): ?CARDIOVERSION (N/A) as a surgical intervention.  The patient's history has been reviewed, patient examined, no change in status, stable for surgery.  I have reviewed the patient's chart and labs.  Questions were answered to the patient's satisfaction.   ? ? ?[name] ? ? ?

## 2021-11-26 DIAGNOSIS — I5023 Acute on chronic systolic (congestive) heart failure: Secondary | ICD-10-CM | POA: Diagnosis not present

## 2021-11-26 DIAGNOSIS — I4891 Unspecified atrial fibrillation: Secondary | ICD-10-CM | POA: Diagnosis not present

## 2021-11-26 DIAGNOSIS — N179 Acute kidney failure, unspecified: Secondary | ICD-10-CM | POA: Diagnosis not present

## 2021-11-26 DIAGNOSIS — I480 Paroxysmal atrial fibrillation: Secondary | ICD-10-CM | POA: Diagnosis not present

## 2021-11-26 DIAGNOSIS — E1169 Type 2 diabetes mellitus with other specified complication: Secondary | ICD-10-CM | POA: Diagnosis not present

## 2021-11-26 LAB — CBC
HCT: 28.4 % — ABNORMAL LOW (ref 36.0–46.0)
Hemoglobin: 8.7 g/dL — ABNORMAL LOW (ref 12.0–15.0)
MCH: 28 pg (ref 26.0–34.0)
MCHC: 30.6 g/dL (ref 30.0–36.0)
MCV: 91.3 fL (ref 80.0–100.0)
Platelets: 196 10*3/uL (ref 150–400)
RBC: 3.11 MIL/uL — ABNORMAL LOW (ref 3.87–5.11)
RDW: 15.5 % (ref 11.5–15.5)
WBC: 6.1 10*3/uL (ref 4.0–10.5)
nRBC: 0 % (ref 0.0–0.2)

## 2021-11-26 LAB — BASIC METABOLIC PANEL
Anion gap: 9 (ref 5–15)
BUN: 34 mg/dL — ABNORMAL HIGH (ref 8–23)
CO2: 26 mmol/L (ref 22–32)
Calcium: 9 mg/dL (ref 8.9–10.3)
Chloride: 103 mmol/L (ref 98–111)
Creatinine, Ser: 2.53 mg/dL — ABNORMAL HIGH (ref 0.44–1.00)
GFR, Estimated: 21 mL/min — ABNORMAL LOW (ref >60)
Glucose, Bld: 117 mg/dL — ABNORMAL HIGH (ref 70–99)
Potassium: 4.3 mmol/L (ref 3.5–5.1)
Sodium: 138 mmol/L (ref 135–145)

## 2021-11-26 LAB — MAGNESIUM: Magnesium: 1.8 mg/dL (ref 1.7–2.4)

## 2021-11-26 MED ORDER — GABAPENTIN 600 MG PO TABS
300.0000 mg | ORAL_TABLET | Freq: Three times a day (TID) | ORAL | Status: DC
  Administered 2021-11-26 – 2021-11-28 (×6): 300 mg via ORAL
  Filled 2021-11-26 (×6): qty 1

## 2021-11-26 MED ORDER — MAGNESIUM SULFATE 2 GM/50ML IV SOLN
2.0000 g | Freq: Once | INTRAVENOUS | Status: AC
  Administered 2021-11-26: 2 g via INTRAVENOUS
  Filled 2021-11-26: qty 50

## 2021-11-26 MED ORDER — AMIODARONE HCL 200 MG PO TABS
400.0000 mg | ORAL_TABLET | Freq: Two times a day (BID) | ORAL | Status: DC
  Administered 2021-11-26 – 2021-11-27 (×4): 400 mg via ORAL
  Filled 2021-11-26 (×4): qty 2

## 2021-11-26 NOTE — Progress Notes (Signed)
?Progress Note ? ? ?PatientMayda Gallagher JGO:[id number] DOB: [birth year] DOA: 11/21/2021     4 ?DOS: the patient was seen and examined on 11/26/2021 ?  ?Brief hospital course: ?[name] was admitted to the hospital with the working diagnosis of decompensated heart failure. ? 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 Reported edema of arms and legs for 2 weeks, and dyspnea for 3 days into her hospitalization. Positive weight gain.  ?Frequent hospitalizations for heart failure, recent one on 08/2021, placed on daily torsemide and 3 time per week metolazone.  ? ?On her initial physical examination her blood pressure was 130/85, HR 142, RR 28, 02 saturation 99%, heart S1 and S2 present, irregularly irregular, tachycardic, lungs with no wheezing, but positive rales, abdomen soft and no lower extremity edema.  ? ?Na 139, K 3,6, CL 105, bicarbonate 24, glucose 147, BUN 16 and CR 2.0 ?BNP 534 ?Wbc 6.5 hgb 10,3 hct 33.0 plt 225  ?Sars covid 19 negative ? ?Chest radiograph with cardiomegaly, bilateral hilar vascular congestion, no infiltrates.  ? ?EKG 105 bpm, right axis deviation, Qtc 565, atrial fibrillation rhythm, with no significant ST segment changes, negative T wave lead I and AVL.  ? ?Patient placed on IV furosemide for diuresis.  ?04/06 biventricular failure with elevated pulmonary pressures and wedge pressure. ?Placed on inotropic support with milrinone.  ?04/07 TEE cardioversion converting to sinus rhythm.  ? ? ?Assessment and Plan: ?* Acute on chronic systolic CHF (congestive heart failure) ([place]) ?Echocardiogram with reduced LV systolic function 35 to 55% with global hypokinesis, interventricular septum is flattened and systole and diastole. Reduced RV systolic function, moderate. RVSP 46.9. sever dilatation of right and left atriums. Severe tricuspid valve  regurgitation.  ? ?Cardiac catheterization with PA 67/37, mean 45, PCW 30, with cardiac output 6,2 and index 2,5 ?Consistent with biventricular failure, with pulmonary hypertension ?Pulmonary hypertension.  ? ?Urine output over last 24 hrs is 5,400 ml ?Systolic blood pressure 99 to 130 mmHg.   ? ?Continue diuresis with furosemide drip (20 mg/hr) and inotropic support with milrinone drip.  ?Had metolazone 04/07.  ? ? ? ?AF (paroxysmal atrial fibrillation) ([place]) ?Continue rate control with amiodarone infusion.  ?Continue anticoagulation with apixaban ? ?Patient now on sinus rhythm post cardioversion ?Continue telemetry monitoring ?Keel K at 4 and Mg at 2.  ? ?Acute kidney injury superimposed on chronic kidney disease ([place]) ?CKD stage 4, Hypokalemia. ? ?Renal function today with serum cr at 2,53 with K at 4,3 and serum bicarbonate at 26. ?Mg 1,8  ? ?Continue diuresis with furosemide and follow up renal function in am. ?Continue to have painful edema at her lower extremities, will add gabapentin.  ? ?Type 2 diabetes mellitus with hyperlipidemia ([place]) ?Fasting glucose is 117 mg/dl.  ? ?Patient is tolerating po well, continue to hold on insulin therapy.  ?Check capillary glucose as needed bid.  ? ?Continue with statin therapy.  ? ?Acute mastoiditis, left ?Continue antibiotic therapy with Augmentin for 7 days.  ? ?Class 3 obesity ([place]) ?Calculated BMI is 51,9  ? ? ? ? ?  ? ?Subjective: Patient with painful edema at her lower extremities, no chest pain, dyspnea has improved.  ? ?Physical Exam: ?Vitals:  ? 11/25/21 1902 11/26/21 0438 11/26/21 0444 11/26/21 1157  ?BP: (!) 108/58 (!) 112/52  130/73  ?Pulse: 83 89  92  ?Resp: 16 19  20  ?Temp: 98.2 ?F (36.8 ?C) 98.7 ?F (37.1 ?C)  98.6 ?F (37 ?C)  ?TempSrc: Oral Oral  Oral  ?SpO2: 98% 92%  95%  ?Weight:   (!) 136.2 kg   ?Height:      ? ?Neurology awake and alert ?ENT with no pallor ?Cardiovascular with S1 and S2 present and rhythmic with no gallops or murmurs ?Moderate  JVD ?Positive lower extremity edema not pitting ?Respiratory with no wheezing or rhonchi ?Abdomen soft and not distended  ?Data Reviewed: ? ? ? ?Family Communication: no family at the bedside  ? ?Disposition: ?Status is: Inpatient ?Remains inpatient appropriate because: heart failure on milrinone and furosemide drip  ? Planned Discharge Destination: Home ? ? ? ?Author: ?Tawni Millers, MD ?11/26/2021 2:24 PM ? ?For on call review [email or web address].  ?

## 2021-11-26 NOTE — Progress Notes (Signed)
Occupational Therapy Treatment ?Patient Details ?Name: [name] ?MRN: [id number] ?DOB: 1955/11/27 ?Today's Date: 11/26/2021 ? ? ?History of present illness This 66 y.o. female presenting on 4/3 with SOB and chest pain.  Found with acute on chronic systolic CHF.  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 ?  ?OT comments ? Pt presented in chair and agreed to therapy. Pt at this time was shown on LE AE to assist as needs min to mod assist with sock donning and LE dressing. Pt completed toilet tasks to [facility] with min guard with transfers, ambulation with FW and peri care. Pt then completed two intervals of ambulation with seated rest break with less then 5 mins of activity. Pt currently with functional limitations due to the deficits listed below (see OT Problem List).  Pt will benefit from skilled OT to increase their safety and independence with ADL and functional mobility for ADL to facilitate discharge to venue listed below.  ?  ? ?Recommendations for follow up therapy are one component of a multi-disciplinary discharge planning process, led by the attending physician.  Recommendations may be updated based on patient status, additional functional criteria and insurance authorization. ?   ?Follow Up Recommendations ? Home health OT  ?  ?Assistance Recommended at Discharge Frequent or constant Supervision/Assistance  ?Patient can return home with the following ? A little help with walking and/or transfers;A little help with bathing/dressing/bathroom;Assistance with cooking/housework;Direct supervision/assist for medications management;Direct supervision/assist for financial management;Help with stairs or ramp for entrance;Assist for transportation ?  ?Equipment Recommendations ? None recommended by OT  ?  ?Recommendations for  Other Services   ? ?  ?Precautions / Restrictions Precautions ?Precautions: Fall ?Precaution Comments: watch HR ?Restrictions ?Weight Bearing Restrictions: No  ? ? ?  ? ?Mobility Bed Mobility ?Overal bed mobility:  (presented in chair) ?  ?  ?  ?  ?  ?  ?  ?  ? ?Transfers ?Overall transfer level: Needs assistance ?Equipment used: Rolling walker (2 wheels) (bring 4WW per pt reproted they feel more comforable with) ?Transfers: Sit to/from Stand ?Sit to Stand: Min guard ?  ?  ?  ?  ?  ?  ?  ?  ?Balance Overall balance assessment: Needs assistance ?Sitting-balance support: No upper extremity supported, Feet supported ?Sitting balance-Leahy Scale: Good ?  ?  ?Standing balance support: Bilateral upper extremity supported, Single extremity supported ?Standing balance-Leahy Scale: Poor ?Standing balance comment: relies on BUE support but able to complete hygiene in standing ?  ?  ?  ?  ?  ?  ?  ?  ?  ?  ?  ?   ? ?ADL either performed or assessed with clinical judgement  ? ?ADL Overall ADL's : Needs assistance/impaired ?Eating/Feeding: Independent;Sitting ?  ?Grooming: Wash/dry hands;Wash/dry face;Set up;Standing ?  ?Upper Body Bathing: Set up;Sitting ?  ?Lower Body Bathing: Minimal assistance;Sit to/from stand ?  ?Upper Body Dressing : Set up;Sitting ?  ?Lower Body Dressing: Minimal assistance;Cueing for safety;Cueing for sequencing;Sit to/from stand ?  ?Toilet Transfer: Min guard;Cueing for safety;Cueing for sequencing;Rolling walker (2 wheels) ?  ?Toileting- Clothing Manipulation and Hygiene: Min guard;Cueing for safety;Cueing for sequencing;Sit to/from stand ?  ?  ?  ?Functional mobility during ADLs: Min guard;Cueing for safety;Cueing for sequencing;Rolling walker (2 wheels) ?  ?  ? ?Extremity/Trunk Assessment Upper Extremity Assessment ?Upper  Extremity Assessment: Generalized weakness (Pt reported sore from all the IV lines) ?  ?Lower Extremity Assessment ?Lower Extremity Assessment: Defer to PT evaluation ?  ?  ?   ? ?Vision   ?  ?  ?Perception   ?  ?Praxis   ?  ? ?Cognition Arousal/Alertness: Awake/alert ?Behavior During Therapy: [facility] for tasks assessed/performed ?Overall Cognitive Status: Within Functional Limits for tasks assessed ?  ?  ?  ?  ?  ?  ?  ?  ?  ?  ?  ?  ?  ?  ?  ?  ?  ?  ?  ?   ?Exercises   ? ?  ?Shoulder Instructions   ? ? ?  ?General Comments    ? ? ?Pertinent Vitals/ Pain       Pain Assessment ?Pain Assessment: 0-10 ?Pain Score: 8  ?Pain Location: B feet ?Pain Descriptors / Indicators: Aching ?Pain Intervention(s): Limited activity within patient's tolerance, Monitored during session, Repositioned ? ?Home Living   ?  ?  ?  ?  ?  ?  ?  ?  ?  ?  ?  ?  ?  ?  ?  ?  ?  ?  ? ?  ?Prior Functioning/Environment    ?  ?  ?  ?   ? ?Frequency ? Min 2X/week  ? ? ? ? ?  ?Progress Toward Goals ? ?OT Goals(current goals can now be found in the care plan section) ? Progress towards OT goals: Progressing toward goals ? ?Acute Rehab OT Goals ?Patient Stated Goal: to be able to have lunch ?OT Goal Formulation: With patient ?Time For Goal Achievement: 12/06/21 ?Potential to Achieve Goals: Good ?ADL Goals ?Pt Will Perform Grooming: with modified independence;standing ?Pt Will Perform Lower Body Dressing: with modified independence;sit to/from stand;with adaptive equipment ?Pt Will Transfer to Toilet: with modified independence;ambulating ?Pt Will Perform Tub/Shower Transfer: shower seat;Tub transfer;with supervision;rolling walker ?Additional ADL Goal #1: Pt will utilize energy conservation and PLB techniques during ADL routine with independence.  ?Plan Discharge plan remains appropriate   ? ?Co-evaluation ? ? ?   ?  ?  ?  ?  ? ?  ?AM-PAC OT "6 Clicks" Daily Activity     ?Outcome Measure ? ? Help from another person eating meals?: None ?Help from another person taking care of personal grooming?: A Little ?Help from another person toileting, which includes using toliet, bedpan, or urinal?: A Little ?Help from another person  bathing (including washing, rinsing, drying)?: A Little ?Help from another person to put on and taking off regular upper body clothing?: A Little ?Help from another person to put on and taking off regular lower body clothing?: A Little ?6 Click Score: 19 ? ?  ?End of Session Equipment Utilized During Treatment: Gait belt;Rolling walker (2 wheels) ? ?OT Visit Diagnosis: Other abnormalities of gait and mobility (R26.89);Muscle weakness (generalized) (M62.81) ?  ?Activity Tolerance Patient tolerated treatment well ?  ?Patient Left in chair;with call bell/phone within reach ?  ?Nurse Communication   ?  ? ?   ? ?Time: 1497-0263 ?OT Time Calculation (min): 36 min ? ?Charges: OT General Charges ?$OT Visit: 1 Visit ?OT Treatments ?$Self Care/Home Management : 23-37 mins ? ?Joeseph Amor OTR/L  ?Acute Rehab Services  ?[phone] office number ?[phone] pager number ? ? ?Joeseph Amor ?11/26/2021, 12:54 PM ?

## 2021-11-26 NOTE — Progress Notes (Signed)
CARDIAC REHAB PHASE I  ? ?PRE:  Rate/Rhythm: 86 SR ? ?  BP: sitting 130/73 ? ?  SaO2: 98 RA ? ?MODE:  Ambulation: 116 ft  ? ?POST:  Rate/Rhythm: 87 AR ? ?  BP: sitting NA  ? ?  SaO2: 97 RA ? ?1150-1215 ?Patient sitting in chair upon arrival with OT. OT/CR worked with patient together providing safe ambulation and toileting. Gait belt utilized with RW. OT assisted with ambulation while CR followed with chair. Sitting rest break needed x 1. Post ambulation patient back to chair with continued OT treatment. CR with continue to follow. Assist x2 recommended.  ? ?Willadean Carol RN, BSN ? ?

## 2021-11-26 NOTE — Progress Notes (Signed)
? ? Advanced Heart Failure Rounding Note ? ?PCP-Cardiologist: Buford Dresser, MD  ? ?Subjective:   ? ?04/03: Admitted with A fib RVR and A/C HFrEF.  Started on amio drip. Diuresing with IV lasix.   ?04/06: RHC with severely elevated biventricular pressures. Severe RV failure. Started milrinone 0.125 + lasix gtt  ? ?1.8L UOP + unmeasured voids charted yesterday, negative 500 cc last 24 hr. Weight unchanged. ? ?Scr 2.09>2.46>2.63>2.54 ? ?Dyspneic with minimal activity. Orthopnea overnight. Abdomen feels distended. ? ?Remains in AF 80s-100s, on amio at 30/hr yesterday  ? ?S/p DCCV now in sinus rhythm ? ? ?Objective:   ?Weight Range: ?(!) 136.2 kg ?Body mass index is 47.03 kg/m?.  ? ?Vital Signs:   ?Temp:  [96.3 ?F (35.7 ?C)-98.7 ?F (37.1 ?C)] 98.7 ?F (37.1 ?C) (04/08 5993) ?Pulse Rate:  [78-91] 89 (04/08 0438) ?Resp:  [16-22] 19 (04/08 0438) ?BP: (104-148)/(52-88) 112/52 (04/08 0438) ?SpO2:  [92 %-100 %] 92 % (04/08 0438) ?Weight:  [136.2 kg] 136.2 kg (04/08 0444) ?Last BM Date : 11/26/21 ? ?Weight change: ?Filed Weights  ? 11/24/21 0900 11/25/21 0559 11/26/21 0444  ?Weight: (!) 138.8 kg (!) 139 kg (!) 136.2 kg  ? ? ?Intake/Output:  ? ?Intake/Output Summary (Last 24 hours) at 11/26/2021 0950 ?Last data filed at 11/26/2021 0745 ?Gross per 24 hour  ?Intake 1700.75 ml  ?Output 6400 ml  ?Net -4699.25 ml  ?  ? ? ?Physical Exam  ?  ?Physical Exam ?Gen: well appearing ?Neuro: alert and oriented ?CV: r,r,r , holosystolic murmur at the sternal border ?Pulm: nl wob, CLAB ?Abd: non distended ?Ext: + BL LE edema ?Skin: warm and well perfused ?Psych: normal mood ? ? ?Telemetry  ? ?NSR (personally reviewed) ? ? ?Labs  ?  ?CBC ?Recent Labs  ?  11/25/21 ?0300 11/26/21 ?0234  ?WBC 6.6 6.1  ?HGB 8.6* 8.7*  ?HCT 27.6* 28.4*  ?MCV 92.3 91.3  ?PLT 204 196  ? ?Basic Metabolic Panel ?Recent Labs  ?  11/24/21 ?0433 11/24/21 ?1152 11/25/21 ?0300 11/26/21 ?0234  ?NA 137   < > 137 138  ?K 4.1   < > 4.2 4.3  ?CL 107  --  105 103  ?CO2 23  --   25 26  ?GLUCOSE 167*  --  164* 117*  ?BUN 28*  --  33* 34*  ?CREATININE 2.63*  --  2.54* 2.53*  ?CALCIUM 8.9  --  8.7* 9.0  ?MG 2.1  --   --  1.8  ? < > = values in this interval not displayed.  ? ?Liver Function Tests ?No results for input(s): AST, ALT, ALKPHOS, BILITOT, PROT, ALBUMIN in the last 72 hours. ? ?No results for input(s): LIPASE, AMYLASE in the last 72 hours. ? ?Cardiac Enzymes ?No results for input(s): CKTOTAL, CKMB, CKMBINDEX, TROPONINI in the last 72 hours. ? ? ?BNP: ?BNP (last 3 results) ?Recent Labs  ?  08/31/21 ?0114 09/23/21 ?1554 11/21/21 ?1122  ?BNP 132.7* 31.1 534.8*  ? ? ?ProBNP (last 3 results) ?No results for input(s): PROBNP in the last 8760 hours. ? ? ?D-Dimer ?No results for input(s): DDIMER in the last 72 hours. ? ?Hemoglobin A1C ?No results for input(s): HGBA1C in the last 72 hours. ? ?Fasting Lipid Panel ?No results for input(s): CHOL, HDL, LDLCALC, TRIG, CHOLHDL, LDLDIRECT in the last 72 hours. ?Thyroid Function Tests ?No results for input(s): TSH, T4TOTAL, T3FREE, THYROIDAB in the last 72 hours. ? ?Invalid input(s): FREET3 ? ? ?Other results: ? ? ?Imaging  ? ?TTE  11/22/2021 ? 1. Left ventricular ejection fraction, by estimation, is 35 to 40%. The  ?left ventricle has moderately decreased function. The left ventricle  ?demonstrates global hypokinesis. Left ventricular diastolic function could  ?not be evaluated. There is the  ?interventricular septum is flattened in systole and diastole, consistent  ?with right ventricular pressure and volume overload.  ? 2. Right ventricular systolic function is moderately reduced. The right  ?ventricular size is moderately enlarged. There is moderately elevated  ?pulmonary artery systolic pressure. The estimated right ventricular  ?systolic pressure is 79.0 mmHg.  ? 3. Left atrial size was severely dilated.  ? 4. Right atrial size was severely dilated.  ? 5. The mitral valve is myxomatous. Trivial mitral valve regurgitation.  ?Mild mitral stenosis.  The mean mitral valve gradient is 8.2 mmHg with  ?average heart rate of 100 bpm. There is a 30 mm CARBOMEDICS ANNULOFLEX  ?RING prosthetic annuloplasty ring present  ? in the mitral position. Procedure Date: 12/29/20.  ? 6. Tricuspid annuloplasty repair. The tricuspid valve is has been  ?repaired/replaced. Tricuspid valve regurgitation is severe.  ? 7. The aortic valve is tricuspid. Aortic valve regurgitation is not  ?visualized. No aortic stenosis is present.  ? 8. The inferior vena cava is dilated in size with >50% respiratory  ?variability, suggesting right atrial pressure of 8 mmHg.  ? ? ? ?Medications:   ? ? ?Scheduled Medications: ? (feeding supplement) PROSource Plus  30 mL Oral BID BM  ? amoxicillin-clavulanate  1 tablet Oral Q12H  ? apixaban  5 mg Oral BID  ? busPIRone  5 mg Oral BID  ? colchicine  0.3 mg Oral Daily  ? DULoxetine  60 mg Oral Daily  ? loratadine  10 mg Oral Daily  ? multivitamin with minerals  1 tablet Oral Daily  ? pantoprazole  40 mg Oral Daily  ? potassium chloride  40 mEq Oral BID  ? rosuvastatin  10 mg Oral QPM  ? sodium chloride flush  3 mL Intravenous Q12H  ? sodium chloride flush  3 mL Intravenous Q12H  ? sodium chloride flush  3 mL Intravenous Q12H  ? ? ?Infusions: ? sodium chloride    ? sodium chloride    ? amiodarone 30 mg/hr (11/26/21 0158)  ? furosemide (LASIX) 200 mg in dextrose 5% 100 mL ('2mg'$ /mL) infusion 20 mg/hr (11/26/21 0913)  ? magnesium sulfate bolus IVPB 2 g (11/26/21 0933)  ? milrinone 0.125 mcg/kg/min (11/26/21 0157)  ? ? ?PRN Medications: ?sodium chloride, sodium chloride, acetaminophen **OR** acetaminophen, albuterol, diphenhydrAMINE, HYDROcodone-acetaminophen, sodium chloride flush, sodium chloride flush ? ? ? ?Patient Profile  ?66 y.o. female with history of morbid obesity, chronic diastolic and systolic HF with mildly reduced LVEF (45-50%), severe MR/TR, atrial fibrillation on Eliquis, HTN, and DMII.  ? ?Admitted with A Fib RVR and A/C HFrEF.  ? ?Now in sinus  rhythm ? ?Continued on milrionone and lasix gtt; crt is stable ? ? ? ?Assessment/Plan  ?1. A fib RVR ?- s/p successful DCCV 4/7, in sinus rhythm ?- changed her amio gtt to oral amiodarone load 400 mg BID. ?- She has failed Tikosyn and amiodarone. ?- Maze in 5/22 and DCCV in 6/22 & 04/19/21, last of which she had ERAF. ?- Saw Dr Quentin Ore in February, discussed AV nodal ablation + CRT if recurrent atrial arrhythmias ?- Continue eliquis 5 mg twice a day.  ? ?  ?2. A/C HFrEF,NICM ?- RHC (5/22): elevated R>L filling pressures with PAPi low, suggestive of significant component of  RV dysfunction, prominent v-waves in PCWP and RA tracings suggestive of MR/TR, CO low ?- RHC (6/22) with low filling pressures with moderately reduced CO. ?- RHC 07/2021 normal filling pressures and moderately reduced cardiac output.  ?- Echo (7/22) in setting of AFL with RVR, showed EF 30-35%, moderate LVH, moderate RV dysfunction, PASP 48, s/p MV repair with mild MR and mean gradient 10 mmHg with HR around 130, s/p TV repair with mild-moderate TR, dilated IVC.  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  ?- Echo 11/22/21: EF 30-35% RV moderately reduced, RVSP 47 mmHg, severe BAE, mild MS, severe TR despite TV repair ?- RHC 11/24/21: RA 29, PA 67/37 (45), PCW 30 ( v waves to 45), Fick CO/CI 6.2/2.5, PA sat 50%, 55%, PAPi 1.0. Severely elevated biventricular pressures, moderate pulmonary hypertension with marked V waves in PCWP tracing and severe RV failure. ?- s/p DCCV with restoration of sinus rhythm ?- lasix gtt increased to 20/ s/p metoalzone 2.5 mg; crt is stable; net negative 3.6 L ?- would continue lasix gtt at current rate  ?- GDMT limited by AKI on CKD, eventually consider spiro and SGLT2i ?- Apply TED hose ?- CR ? ?Valvular Heart Disease ?-S/p MV and TV repair in 5/22 ?-Echo 04/23: EF 30-35%, RV moderately reduced, severe TR ?-RHC as above. Severely elevated biventricular pressures, moderate pulmonary hypertension and severe RV failure. PAPi  1.0. ?-Not sure if there are any options for repair ? ? CAD/Chest pain ?- trivial CAD on heart cath 2019 ?- CP is chronic for her.  ?- hstrop negative. Doubt ischemic ?  ?CKD Stage IV ?- baseline Scr 2-2.5

## 2021-11-27 ENCOUNTER — Encounter (HOSPITAL_COMMUNITY): Payer: Self-pay | Admitting: Internal Medicine

## 2021-11-27 DIAGNOSIS — N179 Acute kidney failure, unspecified: Secondary | ICD-10-CM | POA: Diagnosis not present

## 2021-11-27 DIAGNOSIS — I5023 Acute on chronic systolic (congestive) heart failure: Secondary | ICD-10-CM | POA: Diagnosis not present

## 2021-11-27 DIAGNOSIS — H70002 Acute mastoiditis without complications, left ear: Secondary | ICD-10-CM | POA: Diagnosis not present

## 2021-11-27 DIAGNOSIS — I480 Paroxysmal atrial fibrillation: Secondary | ICD-10-CM | POA: Diagnosis not present

## 2021-11-27 DIAGNOSIS — I4891 Unspecified atrial fibrillation: Secondary | ICD-10-CM | POA: Diagnosis not present

## 2021-11-27 DIAGNOSIS — E1169 Type 2 diabetes mellitus with other specified complication: Secondary | ICD-10-CM | POA: Diagnosis not present

## 2021-11-27 LAB — BASIC METABOLIC PANEL
Anion gap: 9 (ref 5–15)
BUN: 34 mg/dL — ABNORMAL HIGH (ref 8–23)
CO2: 28 mmol/L (ref 22–32)
Calcium: 9.4 mg/dL (ref 8.9–10.3)
Chloride: 102 mmol/L (ref 98–111)
Creatinine, Ser: 2.45 mg/dL — ABNORMAL HIGH (ref 0.44–1.00)
GFR, Estimated: 21 mL/min — ABNORMAL LOW (ref >60)
Glucose, Bld: 146 mg/dL — ABNORMAL HIGH (ref 70–99)
Potassium: 4.5 mmol/L (ref 3.5–5.1)
Sodium: 139 mmol/L (ref 135–145)

## 2021-11-27 NOTE — Progress Notes (Addendum)
? ? Advanced Heart Failure Rounding Note ? ?PCP-Cardiologist: Buford Dresser, MD  ? ?Subjective:   ? ?04/03: Admitted with A fib RVR and A/C HFrEF.  Started on amio drip. Diuresing with IV lasix.   ?04/06: RHC with severely elevated biventricular pressures. Severe RV failure. Started milrinone 0.125 + lasix gtt  ? ?1.8L UOP + unmeasured voids charted yesterday, negative 500 cc last 24 hr. Weight unchanged. ? ?Scr 2.09>2.46>2.63>2.54 ? ?Dyspneic with minimal activity. Orthopnea overnight. Abdomen feels distended. ? ?Remains in AF 80s-100s, on amio at 30/hr yesterday  ? ?11/26/2021 ?S/p DCCV now in sinus rhythm ? ? ?11/27/2021 ?She notes continued PND. She was able to sit in a chair and ambulate.  ? ?Crt stable ? ?Net negative 4 L with 20 cc/hr lasix gtt ? ?Weights 331-> 293 pounds; improving ? ? ? ?Objective:   ?Weight Range: ?133.2 kg ?Body mass index is 45.98 kg/m?.  ? ?Vital Signs:   ?Temp:  [97.6 ?F (36.4 ?C)-98.6 ?F (37 ?C)] 98.6 ?F (37 ?C) (04/09 0506) ?Pulse Rate:  [92-96] 96 (04/09 0506) ?Resp:  [19-23] 23 (04/09 0506) ?BP: (123-130)/(67-74) 123/67 (04/09 0506) ?SpO2:  [95 %-100 %] 98 % (04/09 0506) ?Weight:  [133.2 kg] 133.2 kg (04/09 0506) ?Last BM Date : 11/26/21 ? ?Weight change: ?Filed Weights  ? 11/25/21 0559 11/26/21 0444 11/27/21 0506  ?Weight: (!) 139 kg (!) 136.2 kg 133.2 kg  ? ? ?Intake/Output:  ? ?Intake/Output Summary (Last 24 hours) at 11/27/2021 0844 ?Last data filed at 11/27/2021 0750 ?Gross per 24 hour  ?Intake 902.5 ml  ?Output 4890 ml  ?Net -3987.5 ml  ?  ? ? ?Physical Exam  ?  ?Physical Exam ?Gen: well appearing ?Neuro: alert and oriented ?CV: r,r,r , JVD +,holosystolic murmur at the sternal border, loud P2 ?Pulm: nl wob, CLAB ?Abd: non distended ?Ext: large legs, + BL LE edema ?Skin: warm and well perfused ?Psych: normal mood ? ? ?Telemetry  ? ?NSR (personally reviewed) ? ? ?Labs  ?  ?CBC ?Recent Labs  ?  11/25/21 ?0300 11/26/21 ?0234  ?WBC 6.6 6.1  ?HGB 8.6* 8.7*  ?HCT 27.6* 28.4*  ?MCV  92.3 91.3  ?PLT 204 196  ? ?Basic Metabolic Panel ?Recent Labs  ?  11/26/21 ?0234 11/27/21 ?0414  ?NA 138 139  ?K 4.3 4.5  ?CL 103 102  ?CO2 26 28  ?GLUCOSE 117* 146*  ?BUN 34* 34*  ?CREATININE 2.53* 2.45*  ?CALCIUM 9.0 9.4  ?MG 1.8  --   ? ?Liver Function Tests ?No results for input(s): AST, ALT, ALKPHOS, BILITOT, PROT, ALBUMIN in the last 72 hours. ? ?No results for input(s): LIPASE, AMYLASE in the last 72 hours. ? ?Cardiac Enzymes ?No results for input(s): CKTOTAL, CKMB, CKMBINDEX, TROPONINI in the last 72 hours. ? ? ?BNP: ?BNP (last 3 results) ?Recent Labs  ?  08/31/21 ?0114 09/23/21 ?1554 11/21/21 ?1122  ?BNP 132.7* 31.1 534.8*  ? ? ?ProBNP (last 3 results) ?No results for input(s): PROBNP in the last 8760 hours. ? ? ?D-Dimer ?No results for input(s): DDIMER in the last 72 hours. ? ?Hemoglobin A1C ?No results for input(s): HGBA1C in the last 72 hours. ? ?Fasting Lipid Panel ?No results for input(s): CHOL, HDL, LDLCALC, TRIG, CHOLHDL, LDLDIRECT in the last 72 hours. ?Thyroid Function Tests ?No results for input(s): TSH, T4TOTAL, T3FREE, THYROIDAB in the last 72 hours. ? ?Invalid input(s): FREET3 ? ? ?Other results: ? ? ?Imaging  ? ?TTE 11/22/2021 ? 1. Left ventricular ejection fraction, by estimation, is 35  to 40%. The  ?left ventricle has moderately decreased function. The left ventricle  ?demonstrates global hypokinesis. Left ventricular diastolic function could  ?not be evaluated. There is the  ?interventricular septum is flattened in systole and diastole, consistent  ?with right ventricular pressure and volume overload.  ? 2. Right ventricular systolic function is moderately reduced. The right  ?ventricular size is moderately enlarged. There is moderately elevated  ?pulmonary artery systolic pressure. The estimated right ventricular  ?systolic pressure is 61.4 mmHg.  ? 3. Left atrial size was severely dilated.  ? 4. Right atrial size was severely dilated.  ? 5. The mitral valve is myxomatous. Trivial mitral  valve regurgitation.  ?Mild mitral stenosis. The mean mitral valve gradient is 8.2 mmHg with  ?average heart rate of 100 bpm. There is a 30 mm CARBOMEDICS ANNULOFLEX  ?RING prosthetic annuloplasty ring present  ? in the mitral position. Procedure Date: 12/29/20.  ? 6. Tricuspid annuloplasty repair. The tricuspid valve is has been  ?repaired/replaced. Tricuspid valve regurgitation is severe.  ? 7. The aortic valve is tricuspid. Aortic valve regurgitation is not  ?visualized. No aortic stenosis is present.  ? 8. The inferior vena cava is dilated in size with >50% respiratory  ?variability, suggesting right atrial pressure of 8 mmHg.  ? ? ? ?Medications:   ? ? ?Scheduled Medications: ? (feeding supplement) PROSource Plus  30 mL Oral BID BM  ? amiodarone  400 mg Oral BID  ? amoxicillin-clavulanate  1 tablet Oral Q12H  ? apixaban  5 mg Oral BID  ? busPIRone  5 mg Oral BID  ? colchicine  0.3 mg Oral Daily  ? DULoxetine  60 mg Oral Daily  ? gabapentin  300 mg Oral TID  ? loratadine  10 mg Oral Daily  ? multivitamin with minerals  1 tablet Oral Daily  ? pantoprazole  40 mg Oral Daily  ? potassium chloride  40 mEq Oral BID  ? rosuvastatin  10 mg Oral QPM  ? sodium chloride flush  3 mL Intravenous Q12H  ? sodium chloride flush  3 mL Intravenous Q12H  ? sodium chloride flush  3 mL Intravenous Q12H  ? ? ?Infusions: ? sodium chloride    ? sodium chloride    ? furosemide (LASIX) 200 mg in dextrose 5% 100 mL ('2mg'$ /mL) infusion 20 mg/hr (11/27/21 0530)  ? milrinone 0.125 mcg/kg/min (11/26/21 2109)  ? ? ?PRN Medications: ?sodium chloride, sodium chloride, acetaminophen **OR** acetaminophen, albuterol, diphenhydrAMINE, HYDROcodone-acetaminophen, sodium chloride flush, sodium chloride flush ? ? ? ?Patient Profile  ?66 y.o. female with history of morbid obesity, chronic diastolic and systolic HF with mildly reduced LVEF (45-50%), severe MR/TR, atrial fibrillation on Eliquis, HTN, and DMII.  ? ?Admitted with A Fib RVR and A/C HFrEF.   ? ?Now in sinus rhythm ? ?Continued on milrionone and lasix gtt; crt is stable ? ? ? ?Assessment/Plan  ?1. A fib RVR: resolved ?- s/p successful DCCV 4/7, in sinus rhythm ?- changed her amio gtt to oral amiodarone load 400 mg BID. ?- She has failed Tikosyn and amiodarone. ?- Maze in 5/22 and DCCV in 6/22 & 04/19/21, last of which she had ERAF. ?- Saw Dr Quentin Ore in February, discussed AV nodal ablation + CRT if recurrent atrial arrhythmias ?- Continue eliquis 5 mg twice a day.  ? ?  ?2. A/C HFrEF,NICM/ Group II Moderate PHTN (likely component of Group III-was unable to complete PFTs); RV failure, low CO ?- RHC (5/22): elevated R>L filling pressures with PAPi  low, suggestive of significant component of RV dysfunction, prominent v-waves in PCWP and RA tracings suggestive of MR/TR, CO low ?- RHC (6/22) with low filling pressures with moderately reduced CO. ?- RHC 07/2021 normal filling pressures and moderately reduced cardiac output.  ?- Echo (7/22) in setting of AFL with RVR, showed EF 30-35%, moderate LVH, moderate RV dysfunction, PASP 48, s/p MV repair with mild MR and mean gradient 10 mmHg with HR around 130, s/p TV repair with mild-moderate TR, dilated IVC.  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  ?- Echo 11/22/21: EF 30-35% RV moderately reduced, RVSP 47 mmHg, severe BAE, mild MS, severe TR despite TV repair ?- RHC 11/24/21: RA 29, PA 67/37 (45), PCW 30 ( v waves to 45), Fick CO/CI 6.2/2.5, PA sat 50%, 55%, PAPi 1.0. Severely elevated biventricular pressures, moderate pulmonary hypertension with marked V waves in PCWP tracing and severe RV failure. ?- s/p DCCV with restoration of sinus rhythm ?- lasix gtt increased to 20/ s/p metoalzone 2.5 mg; crt is stable; net negative 3.6 L ?- would continue lasix gtt decreased to 15 cc/hr 11/27/2021 with significant output ?- GDMT limited by AKI on CKD, eventually consider spiro and SGLT2i ?- Apply TED hose ?- CR ? ?Valvular Heart Disease ?-S/p MV and TV repair in 5/22 ?-Echo  04/23: EF 30-35%, RV moderately reduced, severe TR ?-RHC as above. Severely elevated biventricular pressures, moderate pulmonary hypertension and severe RV failure. PAPi 1.0. ?-Not sure if there are any options

## 2021-11-27 NOTE — Progress Notes (Addendum)
?Progress Note ? ? ?PatientAlvena Gallagher JOI:[id number] DOB: 02/04/1956 DOA: 11/21/2021     5 ?DOS: the patient was seen and examined on 11/27/2021 ?  ?Brief hospital course: ?[name] was admitted to the hospital with the working diagnosis of decompensated heart failure. ? 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 Reported edema of arms and legs for 2 weeks, and dyspnea for 3 days into her hospitalization. Positive weight gain.  ?Frequent hospitalizations for heart failure, recent one on 08/2021, placed on daily torsemide and 3 time per week metolazone.  ? ?On her initial physical examination her blood pressure was 130/85, HR 142, RR 28, 02 saturation 99%, heart S1 and S2 present, irregularly irregular, tachycardic, lungs with no wheezing, but positive rales, abdomen soft and no lower extremity edema.  ? ?Na 139, K 3,6, CL 105, bicarbonate 24, glucose 147, BUN 16 and CR 2.0 ?BNP 534 ?Wbc 6.5 hgb 10,3 hct 33.0 plt 225  ?Sars covid 19 negative ? ?Chest radiograph with cardiomegaly, bilateral hilar vascular congestion, no infiltrates.  ? ?EKG 105 bpm, right axis deviation, Qtc 565, atrial fibrillation rhythm, with no significant ST segment changes, negative T wave lead I and AVL.  ? ?Patient placed on IV furosemide for diuresis.  ?04/06 biventricular failure with elevated pulmonary pressures and wedge pressure. ?Placed on inotropic support with milrinone and furosemide drip.  ?04/07 TEE cardioversion converting to sinus rhythm.  ? ? ?Assessment and Plan: ?* Acute on chronic systolic CHF (congestive heart failure) ([place]) ?Echocardiogram with reduced LV systolic function 35 to 47% with global hypokinesis, interventricular septum is flattened and systole and diastole. Reduced RV systolic function, moderate. RVSP 46.9. sever dilatation of right and left atriums. Severe  tricuspid valve regurgitation.  ? ?Cardiac catheterization with PA 67/37, mean 45, PCW 30, with cardiac output 6,2 and index 2,5 ?Consistent with biventricular failure, with pulmonary hypertension ?Pulmonary hypertension.  ? ?Urine output over last 24 hrs is 5,290 ml ?Systolic blood pressure 096 to 123 mmHg.   ? ?Continue diuresis with furosemide drip (15 mg/hr) and inotropic support with milrinone drip.  ?Had metolazone 04/07.  ?Clinically improving volume status but not back to her baseline.  ? ? ?AF (paroxysmal atrial fibrillation) ([place]) ?Continue rate control with amiodarone.   ?Continue anticoagulation with apixaban ? ?Continue on sinus rhythm sp direct current cardioversion.  ? ?Acute kidney injury superimposed on chronic kidney disease ([place]) ?CKD stage 4, Hypokalemia. ? ?Patient with significant urine output, continue to have hypervolemia.  ? ?Renal function with serum cr at 2,45 with K at 4,5 and serum bicarbonate at 28. ?Continue diuresis with furosemide, follow up on renal function in am. ?Avoid hypotension or nephrotoxic medications.  ? ?Continue to have painful edema at her lower extremities, improved with gabapentin.  ? ?Type 2 diabetes mellitus with hyperlipidemia ([place]) ?Fasting glucose is 146 mg/dl.  ?No insulin therapy. ?Continue with statin therapy.   ? ?Acute mastoiditis, left ?Continue antibiotic therapy with Augmentin for 7 days.  ? ?Class 3 obesity ([place]) ?Calculated BMI is 51,9  ? ? ? ? ?  ? ?Subjective: patient is feeling better, but continue to have edema, she was able to visit her family member yesterday in 2 H  ? ?Physical Exam: ?Vitals:  ? 11/26/21 1157 11/26/21 1955 11/27/21 0506 11/27/21 1119  ?BP: 130/73 128/74 123/67 103/62  ?Pulse: 92  96 93  ?  Resp: 20 19 (!) 23 18  ?Temp: 98.6 ?F (37 ?C) 97.6 ?F (36.4 ?C) 98.6 ?F (37 ?C) 98.6 ?F (37 ?C)  ?TempSrc: Oral Oral Oral Oral  ?SpO2: 95% 100% 98% 98%  ?Weight:   133.2 kg   ?Height:      ? ?Neurology awake and alert ?ENT with no  pallor ?Cardiovascular with S1 and S2 present and rhythmic with no gallops or murmurs ?Neck with moderate JVD ?Positive non pitting lower extremity edema  ?Respiratory with no wheezing ?Abdomen protuberant but not distended  ?Data Reviewed: ? ? ? ?Family Communication: no family a the bedside  ? ?Disposition: ?Status is: Inpatient ?Remains inpatient appropriate because: heart failure on milrinone and furosemide drip ? Planned Discharge Destination: Home ? ?Author: ?Tawni Millers, MD ?11/27/2021 1:21 PM ? ?For on call review [email or web address].  ?

## 2021-11-28 ENCOUNTER — Encounter (HOSPITAL_COMMUNITY): Payer: Self-pay | Admitting: Internal Medicine

## 2021-11-28 ENCOUNTER — Inpatient Hospital Stay (HOSPITAL_COMMUNITY): Payer: 59

## 2021-11-28 DIAGNOSIS — R4182 Altered mental status, unspecified: Secondary | ICD-10-CM | POA: Diagnosis present

## 2021-11-28 DIAGNOSIS — I480 Paroxysmal atrial fibrillation: Secondary | ICD-10-CM | POA: Diagnosis not present

## 2021-11-28 DIAGNOSIS — E1169 Type 2 diabetes mellitus with other specified complication: Secondary | ICD-10-CM | POA: Diagnosis not present

## 2021-11-28 DIAGNOSIS — N179 Acute kidney failure, unspecified: Secondary | ICD-10-CM | POA: Diagnosis not present

## 2021-11-28 DIAGNOSIS — I5023 Acute on chronic systolic (congestive) heart failure: Secondary | ICD-10-CM | POA: Diagnosis not present

## 2021-11-28 LAB — GLUCOSE, CAPILLARY
Glucose-Capillary: 181 mg/dL — ABNORMAL HIGH (ref 70–99)
Glucose-Capillary: 183 mg/dL — ABNORMAL HIGH (ref 70–99)

## 2021-11-28 LAB — BASIC METABOLIC PANEL WITH GFR
Anion gap: 9 (ref 5–15)
BUN: 36 mg/dL — ABNORMAL HIGH (ref 8–23)
CO2: 29 mmol/L (ref 22–32)
Calcium: 9.2 mg/dL (ref 8.9–10.3)
Chloride: 100 mmol/L (ref 98–111)
Creatinine, Ser: 2.6 mg/dL — ABNORMAL HIGH (ref 0.44–1.00)
GFR, Estimated: 20 mL/min — ABNORMAL LOW
Glucose, Bld: 175 mg/dL — ABNORMAL HIGH (ref 70–99)
Potassium: 4 mmol/L (ref 3.5–5.1)
Sodium: 138 mmol/L (ref 135–145)

## 2021-11-28 LAB — MAGNESIUM: Magnesium: 2 mg/dL (ref 1.7–2.4)

## 2021-11-28 MED ORDER — AMIODARONE HCL 200 MG PO TABS
200.0000 mg | ORAL_TABLET | Freq: Two times a day (BID) | ORAL | Status: DC
  Administered 2021-11-28 – 2021-11-29 (×3): 200 mg via ORAL
  Filled 2021-11-28 (×3): qty 1

## 2021-11-28 MED ORDER — IOHEXOL 350 MG/ML SOLN
75.0000 mL | Freq: Once | INTRAVENOUS | Status: AC | PRN
Start: 2021-11-28 — End: 2021-11-28
  Administered 2021-11-28: 75 mL via INTRAVENOUS

## 2021-11-28 MED ORDER — MORPHINE SULFATE (PF) 2 MG/ML IV SOLN
1.0000 mg | INTRAVENOUS | Status: DC | PRN
Start: 2021-11-28 — End: 2021-11-30
  Administered 2021-11-28 (×2): 1 mg via INTRAVENOUS
  Filled 2021-11-28 (×2): qty 1

## 2021-11-28 NOTE — Progress Notes (Signed)
?Progress Note ? ? ?PatientDeniece Gallagher QMG:[id number] DOB: Oct 26, 1955 DOA: 11/21/2021     6 ?DOS: the patient was seen and examined on 11/28/2021 ?  ?Brief hospital course: ?[name] was admitted to the hospital with the working diagnosis of decompensated heart failure. ? 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 Reported edema of arms and legs for 2 weeks, and dyspnea for 3 days into her hospitalization. Positive weight gain.  ?Frequent hospitalizations for heart failure, recent one on 08/2021, placed on daily torsemide and 3 time per week metolazone.  ? ?On her initial physical examination her blood pressure was 130/85, HR 142, RR 28, 02 saturation 99%, heart S1 and S2 present, irregularly irregular, tachycardic, lungs with no wheezing, but positive rales, abdomen soft and no lower extremity edema.  ? ?Na 139, K 3,6, CL 105, bicarbonate 24, glucose 147, BUN 16 and CR 2.0 ?BNP 534 ?Wbc 6.5 hgb 10,3 hct 33.0 plt 225  ?Sars covid 19 negative ? ?Chest radiograph with cardiomegaly, bilateral hilar vascular congestion, no infiltrates.  ? ?EKG 105 bpm, right axis deviation, Qtc 565, atrial fibrillation rhythm, with no significant ST segment changes, negative T wave lead I and AVL.  ? ?Patient placed on IV furosemide for diuresis.  ?04/06 biventricular failure with elevated pulmonary pressures and wedge pressure. ?Placed on inotropic support with milrinone and furosemide drip.  ?04/07 TEE cardioversion converting to sinus rhythm.  ? ?04/10 patient with acute altered mental status, slurring speech and stuttering.  ?CODE stroke was called and MRI was ordered.  ? ?Assessment and Plan: ?* Acute on chronic systolic CHF (congestive heart failure) ([place]) ?Echocardiogram with reduced LV systolic function 35 to 32% with global hypokinesis, interventricular septum is flattened  and systole and diastole. Reduced RV systolic function, moderate. RVSP 46.9. sever dilatation of right and left atriums. Severe tricuspid valve regurgitation.  ? ?Cardiac catheterization with PA 67/37, mean 45, PCW 30, with cardiac output 6,2 and index 2,5 ?Consistent with biventricular failure, with pulmonary hypertension ?Pulmonary hypertension.  ? ?Urine output over last 24 hrs is 5,700 ml ?Systolic blood pressure 671/24 mmHg.   ? ?Had metolazone 04/07.  ?Patient continue on milrinone infusion and furosemide drip. ? ? ?AF (paroxysmal atrial fibrillation) ([place]) ?Continue rate control with amiodarone.   ?Continue anticoagulation with apixaban ? ?Continue on sinus rhythm sp direct current cardioversion.  ? ?Acute kidney injury superimposed on chronic kidney disease ([place]) ?CKD stage 4, Hypokalemia. ? ?Renal function with serum cr at 2,60with K at 4,0 and serum bicarbonate at 29. ? ?Plan to continue diuresis with furosemide drip and continue with inotropic support with milrinone.  ? ?For painful edema at her lower extremities, continue with gabapentin.  ? ?Type 2 diabetes mellitus with hyperlipidemia ([place]) ?Fasting glucose is 175 mg/dl.  ?No insulin therapy. ?Continue with statin therapy.   ? ?Acute mastoiditis, left ?Completed 7 days of Augmentin.  ? ?Class 3 obesity ([place]) ?Calculated BMI is 51,9  ? ?Altered mental status ?Patient is awake and alert, following commands but continue to have difficulty speaking stuttering.  ?Follow code stroke protocol and follow up on MRI and neurology recommendations.  ? ? ? ? ? ?  ? ?Subjective: patient is awake and alert, positive headache, no dyspnea or chest pain. Continue to have difficulty speaking  ? ?Physical Exam: ?Vitals:  ? 11/28/21 0403 11/28/21 0812 11/28/21 0902 11/28/21 1122  ?  BP: (!) 114/58 140/68 136/81 102/68  ?Pulse: 95 96 91 93  ?Resp: (!) 21 (!) 23 (!) 23 20  ?Temp: 97.6 ?F (36.4 ?C)  98.5 ?F (36.9 ?C) 98.7 ?F (37.1 ?C)  ?TempSrc: Oral  Oral Oral  ?SpO2: 95% 97%  94% 96%  ?Weight: 131.8 kg     ?Height:      ? ?Neurology awake and alert, positive stuttering, follows simple commands and responds to simple questions, no frank focal weakness ?ENT with mid pallor ?Cardiovascular with S1 and S2 present and rhythmic with no gallops or murmurs ?Mild JVD ?Positive lower extremity edema  ?Respiratory with no rales or wheezing ?Abdomen protuberant but not distended  ?Data Reviewed: ? ? ? ?Family Communication: I spoke with patient's sisters at the bedside, we talked in detail about patient's condition, plan of care and prognosis and all questions were addressed. ? ? ?Disposition: ?Status is: Inpatient ?Remains inpatient appropriate because: heart failure  ? Planned Discharge Destination: Home ? ?Author: ?Tawni Millers, MD ?11/28/2021 12:40 PM ? ?For on call review [email or web address].  ?

## 2021-11-28 NOTE — Progress Notes (Signed)
PT Cancellation Note ? ?Patient Details ?Name: [name] ?MRN: [id number] ?DOB: 1956/04/15 ? ? ?Cancelled Treatment:    Reason Eval/Treat Not Completed: Other (comment) Pt with code stroke called this morning, RN requesting PT hold out of bed activity until after MRI. Will continue to follow and return after the pt has MRI.  ? ?[place], PT, DPT  ? ?Acute Rehabilitation Department ?Pager #: [phone] ? ? ?[name] ?11/28/2021, 4:33 PM ?

## 2021-11-28 NOTE — Progress Notes (Addendum)
Patient was on face time with daughter, when they reported patient's eyes rolled back into head. Daughter called front desk to report. Upon entering room, patient tearful and having difficulty speaking and following commands. BG stable at 183 and VS WNL. LKW at 0730. Code stroke called. PA and MD at bedside to assess. Patient in route for stat CT.  ?

## 2021-11-28 NOTE — Progress Notes (Addendum)
? ? Advanced Heart Failure Rounding Note ? ?PCP-Cardiologist: Buford Dresser, MD  ? ?Subjective:   ? ?04/03: Admitted with A fib RVR and A/C HFrEF.  Started on amio drip. Diuresing with IV lasix.   ?04/06: RHC with severely elevated biventricular pressures. Severe RV failure. Started milrinone 0.125 + lasix gtt  ?04/07: S/p DCCV, Afib >>>NSR ? ?Continues on milrinone 0.125 + lasix gtt at 15/hr.  ? ?Brisk diuresis noted again yesterday. Negative 4.7L + unmeasured void.  Weight down another 3 lb, weight down 16 lb from admit. ? ?Maintaining SR on po amio 400 mg BID. ? ?Scr stable at 2.6. K 4.0, Mag 2.0.  ? ?BP stable.  ? ?On FaceTime call with daughter around 7:30 am. Unit secretary received report from daughter around 8:10 am that patient's speech appeared incoherent and her eyes rolled back. ? ?Patient evaluated at bedside. Appeared confused. Speech slurred with stuttering. B/l UE tremor noted. Code Stroke called. Stroke team arrived to bedside shortly after. ? ? ? ?Objective:   ?Weight Range: ?131.8 kg ?Body mass index is 45.51 kg/m?.  ? ?Vital Signs:   ?Temp:  [97.6 ?F (36.4 ?C)-98.6 ?F (37 ?C)] 97.6 ?F (36.4 ?C) (04/10 0403) ?Pulse Rate:  [88-95] 95 (04/10 0403) ?Resp:  [18-21] 21 (04/10 0403) ?BP: (103-117)/(58-74) 114/58 (04/10 0403) ?SpO2:  [95 %-98 %] 95 % (04/10 0403) ?Weight:  [131.8 kg] 131.8 kg (04/10 0403) ?Last BM Date : 11/26/21 ? ?Weight change: ?Filed Weights  ? 11/26/21 0444 11/27/21 0506 11/28/21 0403  ?Weight: (!) 136.2 kg 133.2 kg 131.8 kg  ? ? ?Intake/Output:  ? ?Intake/Output Summary (Last 24 hours) at 11/28/2021 0748 ?Last data filed at 11/28/2021 0700 ?Gross per 24 hour  ?Intake 1027.49 ml  ?Output 5100 ml  ?Net -4072.51 ml  ?  ? ? ?Physical Exam  ?  ?General:  Tearful.  ?HEENT: normal ?Neck: supple. JVP difficult but appears elevated. Carotids 2+ bilat; no bruits.  ?Cor: PMI nondisplaced. Regular rate & rhythm. No rubs, gallops, 2/6 TR murmur. ?Lungs: clear ?Abdomen: obese, soft,  nontender, nondistended. No hepatosplenomegaly.  ?Extremities: no cyanosis, clubbing, rash, 1+ edema ?Neuro: Awake. Oriented to time and person. Cannot tell me where she is or why she is here. B/l upper extremity tremor. ? ? ? ? ?Telemetry  ? ?SR 90s, ~ 10 PVCs/min ? ? ?Labs  ?  ?CBC ?Recent Labs  ?  11/26/21 ?0234  ?WBC 6.1  ?HGB 8.7*  ?HCT 28.4*  ?MCV 91.3  ?PLT 196  ? ?Basic Metabolic Panel ?Recent Labs  ?  11/26/21 ?0234 11/27/21 ?0414 11/28/21 ?2841  ?NA 138 139 138  ?K 4.3 4.5 4.0  ?CL 103 102 100  ?CO2 '26 28 29  '$ ?GLUCOSE 117* 146* 175*  ?BUN 34* 34* 36*  ?CREATININE 2.53* 2.45* 2.60*  ?CALCIUM 9.0 9.4 9.2  ?MG 1.8  --  2.0  ? ?Liver Function Tests ?No results for input(s): AST, ALT, ALKPHOS, BILITOT, PROT, ALBUMIN in the last 72 hours. ? ?No results for input(s): LIPASE, AMYLASE in the last 72 hours. ? ?Cardiac Enzymes ?No results for input(s): CKTOTAL, CKMB, CKMBINDEX, TROPONINI in the last 72 hours. ? ? ?BNP: ?BNP (last 3 results) ?Recent Labs  ?  08/31/21 ?0114 09/23/21 ?1554 11/21/21 ?1122  ?BNP 132.7* 31.1 534.8*  ? ? ?ProBNP (last 3 results) ?No results for input(s): PROBNP in the last 8760 hours. ? ? ?D-Dimer ?No results for input(s): DDIMER in the last 72 hours. ? ?Hemoglobin A1C ?No results for input(s): HGBA1C  in the last 72 hours. ? ?Fasting Lipid Panel ?No results for input(s): CHOL, HDL, LDLCALC, TRIG, CHOLHDL, LDLDIRECT in the last 72 hours. ?Thyroid Function Tests ?No results for input(s): TSH, T4TOTAL, T3FREE, THYROIDAB in the last 72 hours. ? ?Invalid input(s): FREET3 ? ? ?Other results: ? ? ?Imaging  ? ? ?No results found. ? ? ?Medications:   ? ? ?Scheduled Medications: ? (feeding supplement) PROSource Plus  30 mL Oral BID BM  ? amiodarone  400 mg Oral BID  ? amoxicillin-clavulanate  1 tablet Oral Q12H  ? apixaban  5 mg Oral BID  ? busPIRone  5 mg Oral BID  ? colchicine  0.3 mg Oral Daily  ? DULoxetine  60 mg Oral Daily  ? gabapentin  300 mg Oral TID  ? loratadine  10 mg Oral Daily  ?  multivitamin with minerals  1 tablet Oral Daily  ? pantoprazole  40 mg Oral Daily  ? potassium chloride  40 mEq Oral BID  ? rosuvastatin  10 mg Oral QPM  ? sodium chloride flush  3 mL Intravenous Q12H  ? sodium chloride flush  3 mL Intravenous Q12H  ? sodium chloride flush  3 mL Intravenous Q12H  ? ? ?Infusions: ? sodium chloride    ? sodium chloride    ? furosemide (LASIX) 200 mg in dextrose 5% 100 mL ('2mg'$ /mL) infusion 15 mg/hr (11/28/21 0601)  ? milrinone 0.125 mcg/kg/min (11/27/21 1313)  ? ? ?PRN Medications: ?sodium chloride, sodium chloride, acetaminophen **OR** acetaminophen, albuterol, diphenhydrAMINE, HYDROcodone-acetaminophen, sodium chloride flush, sodium chloride flush ? ? ? ?Patient Profile  ?66 y.o. female with history of morbid obesity, chronic diastolic and systolic HF with mildly reduced LVEF (45-50%), severe MR/TR, atrial fibrillation on Eliquis, HTN, and DMII.  ? ?Admitted with A Fib RVR and A/C HFrEF.  ? ? ? ?Assessment/Plan  ?1. A fib RVR ?- She has failed Tikosyn ?- Maze in 5/22 and DCCV in 6/22 & 04/19/21, last of which she had ERAF. ?- Saw Dr Quentin Ore in February, discussed AV nodal ablation + CRT if recurrent atrial arrhythmias ?- S/p successful DCCV on 04/07. Maintaining SR. ?- Amio gtt switched to po amio 400 mg BID on 04/08. Decrease to 200 mg BID in view of tremors. See below. ?- Continue Eliquis 5 mg BID. ?  ?2. A/C HFrEF,NICM ?- RHC (5/22): elevated R>L filling pressures with PAPi low, suggestive of significant component of RV dysfunction, prominent v-waves in PCWP and RA tracings suggestive of MR/TR, CO low ?- RHC (6/22) with low filling pressures with moderately reduced CO. ?- RHC 07/2021 normal filling pressures and moderately reduced cardiac output.  ?- Echo (7/22) in setting of AFL with RVR, showed EF 30-35%, moderate LVH, moderate RV dysfunction, PASP 48, s/p MV repair with mild MR and mean gradient 10 mmHg with HR around 130, s/p TV repair with mild-moderate TR, dilated IVC.  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  ?- Echo 11/22/21: EF 30-35% RV moderately reduced, RVSP 47 mmHg, severe BAE, mild MS, severe TR despite TV repair ?- RHC 11/24/21: RA 29, PA 67/37 (45), PCW 30 ( v waves to 45), Fick CO/CI 6.2/2.5, PA sat 50%, 55%, PAPi 1.0. Severely elevated biventricular pressures, moderate pulmonary hypertension with marked V waves in PCWP tracing and severe RV failure. ?- Suspect AF contributing to decompensation. S/p successful DCCV on 04/07 as above. ?- Continue milrinone 0.125 + lasix gtt at 15/hr. Brisk diuresis again noted. Down another 3 lb overnight. Renal function stable. ?- GDMT limited  by AKI on CKD, eventually consider spiro and SGLT2i ?- TED hose ?- CR ? ?3. PVCs ?-10/min on tele ?-Continue amiodarone ?-Hopefully burden improves once off milrinone ? ?4. Valvular Heart Disease ?-S/p MV and TV repair in 5/22 ?-Echo 04/23: EF 30-35%, RV moderately reduced, severe TR ?-RHC as above. Severely elevated biventricular pressures, moderate pulmonary hypertension and severe RV failure. PAPi 1.0. ?-Not sure if there are any options for repair ? ?5. CAD/Chest pain ?- trivial CAD on heart cath 2019 ?- CP is chronic for her.  ?- hstrop negative. Doubt ischemic ?  ?6. CKD Stage IV ?- baseline Scr 2-2.5 ?- Scr 2.09>2.46.2.63>2.54>2.45>2.60 ?- Watch closely with diuresis. Daily BMET ?  ?7. DMII ?- per primary  ?- consider SGLT2i as renal function stabilizes ?  ?8. Morbid Obesity  ?- needs weight loss ?- May be candidate for GLP1RA ? ?9. Anemia ?- Baseline 10-12, Hgb 10.3>9.5>9.8>8.6>8.7 ?- Monitor. No overt bleeding. ?- Iron stores low. Consider feraheme once treatment for mastoiditis complete. ? ?10. Confusion/slurred speech/tremor ?- Last normal around 7:30 am when assessed by RN. Notified around 8:10 am of slurred speech and confusion. I called daughter via phone and confirmed timing of events.  ?- Blood glucose 180. BP stable.  ?- Code stroke activated. Taken for STAT CT head. ?- Reviewed med rec.  Has not missed any doses of Eliquis since admission. ?- ? Amiodarone contributing to tremor, will decrease dose. May need to consider a different agent. ? ? ? ?Length of Stay: 6 ? ?FINCH, LINDSAY N, PA-C  ?4/10/

## 2021-11-28 NOTE — Consult Note (Signed)
NEUROLOGY CONSULTATION NOTE  ? ?Date of service: November 28, 2021 ?Patient Name: [name] ?MRN:  [id number] ?DOB:  Aug 19, 1956 ?Reason for consult: "Stroke code for word finding difficulty" ?Requesting Provider: Tawni Millers,* ?_ _ _   _ __   _ __ _ _  __ __   _ __   __ _ ? ?History of Present Illness  ?[name] is a 66 y.o. female with PMH significant for diabetes, COPD, CKD stage IV, paroxysmal atrial fibrillation, mitral valve repair and maze procedure on Eliquis with the last dose given in the p.m. on 11/27/2021.  She initially presented to the hospital with dyspnea and found to have acute on chronic systolic CHF, A-fib on amiodarone gtt., AKI on CKD 4. ? ?Today in the morning patient was noted to have slurring of her speech along with gibberish/stuttering noted when asked to identify objects.  This was noted by her daughter over the phone.  Last known well is 0730 on 11/28/2021. ? ? ?LKW: 6237 on 11/28/21 ?mRS: 2 ?tNKASE: Patient took her Eliquis last night and is thus not a candidate for TNKase. ?Thrombectomy: not offered, no LVO on my review ?NIHSS components Score: Comment  ?1a Level of Conscious 0'[x]'$  1'[]'$  2'[]'$  3'[]'$      ?1b LOC Questions 0'[]'$  1'[x]'$  2'[]'$       ?1c LOC Commands 0'[x]'$  1'[]'$  2'[]'$       ?2 Best Gaze 0'[x]'$  1'[]'$  2'[]'$       ?3 Visual 0'[x]'$  1'[]'$  2'[]'$  3'[]'$      ?4 Facial Palsy 0'[x]'$  1'[]'$  2'[]'$  3'[]'$      ?5a Motor Arm - left 0'[]'$  1'[x]'$  2'[]'$  3'[]'$  4'[]'$  UN'[]'$    ?5b Motor Arm - Right 0'[]'$  1'[x]'$  2'[]'$  3'[]'$  4'[]'$  UN'[]'$    ?6a Motor Leg - Left 0'[]'$  1'[]'$  2'[]'$  3'[x]'$  4'[]'$  UN'[]'$    ?6b Motor Leg - Right 0'[]'$  1'[]'$  2'[]'$  3'[x]'$  4'[]'$  UN'[]'$    ?7 Limb Ataxia 0'[x]'$  1'[]'$  2'[]'$  3'[]'$  UN'[]'$     ?8 Sensory 0'[x]'$  1'[]'$  2'[]'$  UN'[]'$      ?9 Best Language 0'[]'$  1'[]'$  2'[x]'$  3'[]'$      ?10 Dysarthria 0'[x]'$  1'[]'$  2'[]'$  UN'[]'$      ?11 Extinct. and Inattention 0'[x]'$  1'[]'$  2'[]'$       ?TOTAL: 11   ? ?  ?ROS  ? ?Unable to obtain 2/2 Aphasia. ? ?Past History  ? ?Past Medical History:  ?Diagnosis Date  ? Acute on chronic systolic CHF (congestive heart failure) ([place]) 12/21/2020  ? Allergic rhinitis   ?  Arthritis   ? Asthma   ? Chest pain 12/27/2017  ? Chronic diastolic CHF (congestive heart failure) ([place])   ? COPD (chronic obstructive pulmonary disease) ([place])   ? Depression   ? DM (diabetes mellitus) ([place])   ? DVT (deep vein thrombosis) in pregnancy   ? Dysrhythmia   ? atrial fibrilation  ? GERD (gastroesophageal reflux disease)   ? Headache(784.0)   ? HTN (hypertension)   ? Hyperlipidemia   ? Obesity   ? Pneumonia 04/2018  ? RIGHT LOBE  ? Shortness of breath   ? Sleep apnea   ? compliant with CPAP  ? ?Past Surgical History:  ?Procedure Laterality Date  ? ABDOMINAL HYSTERECTOMY    ? BUBBLE STUDY  11/26/2020  ? Procedure: BUBBLE STUDY;  Surgeon: Buford Dresser, MD;  Location: [facility];  Service: Cardiovascular;;  ? CARDIOVERSION N/A 05/06/2020  ? Procedure: CARDIOVERSION;  Surgeon: Buford Dresser, MD;  Location: [place];  Service: Cardiovascular;  Laterality: N/A;  ? CARDIOVERSION N/A 11/04/2020  ? Procedure: CARDIOVERSION;  Surgeon: Lelon Perla, MD;  Location: [place];  Service: Cardiovascular;  Laterality: N/A;  ? CARDIOVERSION N/A 02/01/2021  ? Procedure: CARDIOVERSION;  Surgeon: Jolaine Artist, MD;  Location: [place];  Service: Cardiovascular;  Laterality: N/A;  ? CARDIOVERSION N/A 02/09/2021  ? Procedure: CARDIOVERSION;  Surgeon: Jolaine Artist, MD;  Location: [place];  Service: Cardiovascular;  Laterality: N/A;  ? CARDIOVERSION N/A 04/19/2021  ? Procedure: CARDIOVERSION;  Surgeon: Fay Records, MD;  Location: [facility] ENDOSCOPY;  Service: Cardiovascular;  Laterality: N/A;  ? CATARACT EXTRACTION, BILATERAL    ? CHOLECYSTECTOMY    ? COLONOSCOPY WITH PROPOFOL N/A 03/05/2015  ? Procedure: COLONOSCOPY WITH PROPOFOL;  Surgeon: Carol Ada, MD;  Location: [facility] ENDOSCOPY;  Service: Endoscopy;  Laterality: N/A;  ? DIAGNOSTIC LAPAROSCOPY    ? ingrown hallux Left   ? KNEE SURGERY    ? LEFT HEART CATH AND CORONARY ANGIOGRAPHY N/A 12/31/2017  ? Procedure: LEFT HEART CATH AND  CORONARY ANGIOGRAPHY;  Surgeon: Charolette Forward, MD;  Location: [place] CV LAB;  Service: Cardiovascular;  Laterality: N/A;  ? MAZE N/A 12/29/2020  ? Procedure: MAZE;  Surgeon: [name], MD;  Location: [place];  Service: Open Heart Surgery;  Laterality: N/A;  ? MITRAL VALVE REPAIR N/A 12/29/2020  ? Procedure: MITRAL VALVE REPAIR USING CARBOMEDICS ANNULOFLEX [place] SIZE 30;  Surgeon: [name], MD;  Location: [place];  Service: Open Heart Surgery;  Laterality: N/A;  ? RIGHT HEART CATH N/A 12/22/2020  ? Procedure: RIGHT HEART CATH;  Surgeon: Larey Dresser, MD;  Location: [place] [facility];  Service: Cardiovascular;  Laterality: N/A;  ? RIGHT HEART CATH N/A 01/31/2021  ? Procedure: RIGHT HEART CATH;  Surgeon: Jolaine Artist, MD;  Location: [place] [facility];  Service: Cardiovascular;  Laterality: N/A;  ? RIGHT HEART CATH N/A 07/29/2021  ? Procedure: RIGHT HEART CATH;  Surgeon: Jolaine Artist, MD;  Location: [place] [facility];  Service: Cardiovascular;  Laterality: N/A;  ? RIGHT HEART CATH N/A 11/24/2021  ? Procedure: RIGHT HEART CATH;  Surgeon: Jolaine Artist, MD;  Location: [place] CV LAB;  Service: Cardiovascular;  Laterality: N/A;  ? TEE WITHOUT CARDIOVERSION N/A 11/26/2020  ? Procedure: TRANSESOPHAGEAL ECHOCARDIOGRAM (TEE);  Surgeon: Buford Dresser, MD;  Location: [place];  Service: Cardiovascular;  Laterality: N/A;  ? TEE WITHOUT CARDIOVERSION N/A 12/29/2020  ? Procedure: TRANSESOPHAGEAL ECHOCARDIOGRAM (TEE);  Surgeon: [name], MD;  Location: [facility];  Service: Open Heart Surgery;  Laterality: N/A;  ? TRICUSPID VALVE REPLACEMENT N/A 12/29/2020  ? Procedure: 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 RING SIZE 34;  Surgeon: [name], MD;  Location: [place];  Service: Open Heart Surgery;  Laterality: N/A;  ? ?Family History  ?Problem Relation Age of Onset  ? Cancer Mother   ? Hypertension Mother   ? Cancer Father   ? CAD Other   ?  Hypertension Sister   ? Hypertension Brother   ? ?Social History  ? ?Socioeconomic History  ? Marital status: Significant Other  ?  Spouse name: Not on file  ? Number of children: 2  ? Years of education: Not on file  ? Highest education level: Some college, no degree  ?Occupational History  ? Not on file  ?Tobacco Use  ? Smoking status: Never  ? Smokeless tobacco: Never  ?Vaping Use  ? Vaping Use: Never used  ?Substance and Sexual Activity  ? Alcohol use: No  ? Drug use:  No  ? Sexual activity: Not Currently  ?Other Topics Concern  ? Not on file  ?Social History Narrative  ? Pt lives in [place] with spouse.  2 grown children.  ? Previously worked in [employer] at [employer].  Now on disability  ? ?Social Determinants of Health  ? ?Financial Resource Strain: Low Risk   ? Difficulty of Paying Living Expenses: Not hard at all  ?Food Insecurity: No Food Insecurity  ? Worried About Charity fundraiser in the Last Year: Never true  ? Ran Out of Food in the Last Year: Never true  ?Transportation Needs: No Transportation Needs  ? Lack of Transportation (Medical): No  ? Lack of Transportation (Non-Medical): No  ?Physical Activity: Insufficiently Active  ? Days of Exercise per Week: 2 days  ? Minutes of Exercise per Session: 70 min  ?Stress: No Stress Concern Present  ? Feeling of Stress : Not at all  ?Social Connections: Socially Isolated  ? Frequency of Communication with Friends and Family: More than three times a week  ? Frequency of Social Gatherings with Friends and Family: Three times a week  ? Attends Religious Services: Never  ? Active Member of Clubs or Organizations: No  ? Attends Archivist Meetings: Never  ? Marital Status: Widowed  ? ?Allergies  ?Allergen Reactions  ? Bee Pollen Anaphylaxis  ? Sulfa Antibiotics Itching  ? ? ?Medications  ? ?Medications Prior to Admission  ?Medication Sig Dispense Refill Last Dose  ? acetaminophen (TYLENOL) 500 MG tablet Take 1,000 mg by mouth every 6 (six) hours as  needed for mild pain.   11/20/2021  ? albuterol (VENTOLIN HFA) 108 (90 Base) MCG/ACT inhaler INHALE 2 PUFFS BY MOUTH EVERY 6 HOURS AS NEEDED (Patient taking differently: Inhale 2 puffs into the lungs every 6 (six) hours as

## 2021-11-28 NOTE — Assessment & Plan Note (Addendum)
CVA has been ruled out.  ?She developed stuttering and tremors that slowly improved, possible medication related to gabapentin. ? ?Low GFR likely made recovery more slow.  ?Continue neuro checks per unit protocol.  ? ?For depression continue with buspirone. ?For back pain continue with duloxetine. ?

## 2021-11-28 NOTE — Code Documentation (Signed)
Stroke Response Nurse Documentation ?Code Documentation ? ?[name] is a 66 y.o. female admitted on 11/22/21 with a history of DM, CKD, Afib currently on Eliquis.  ? ?Patient was LKW at [facility] when she was [place] family and they noticed she started having difficulties with speech so they alerted the nurses station.  ? ?Stroke team at the bedside on patient arrival. Patient to CT with team. NIHSS 9, see documentation for details and code stroke times. Patient with disoriented, bilateral arm weakness, bilateral leg weakness, and Expressive aphasia  on exam. The following imaging was completed:  CT Head and CTA. Patient is not a candidate for IV Thrombolytic due to being on Eliquis. Patient is not not a candidate for IR due to no suspected LVO.  ? ?Care Plan: Q2 NIHSS and VS. Routine MRI. [facility] Swallow screen before next meal.  ? ?Bedside handoff with [facility] RN Alma Friendly. ? ?[name]  ?Stroke Response RN ? ? ?

## 2021-11-29 DIAGNOSIS — N179 Acute kidney failure, unspecified: Secondary | ICD-10-CM | POA: Diagnosis not present

## 2021-11-29 DIAGNOSIS — I480 Paroxysmal atrial fibrillation: Secondary | ICD-10-CM | POA: Diagnosis not present

## 2021-11-29 DIAGNOSIS — E1169 Type 2 diabetes mellitus with other specified complication: Secondary | ICD-10-CM | POA: Diagnosis not present

## 2021-11-29 DIAGNOSIS — I5023 Acute on chronic systolic (congestive) heart failure: Secondary | ICD-10-CM | POA: Diagnosis not present

## 2021-11-29 LAB — CBC
HCT: 27.6 % — ABNORMAL LOW (ref 36.0–46.0)
Hemoglobin: 8.5 g/dL — ABNORMAL LOW (ref 12.0–15.0)
MCH: 28.4 pg (ref 26.0–34.0)
MCHC: 30.8 g/dL (ref 30.0–36.0)
MCV: 92.3 fL (ref 80.0–100.0)
Platelets: 219 10*3/uL (ref 150–400)
RBC: 2.99 MIL/uL — ABNORMAL LOW (ref 3.87–5.11)
RDW: 14.9 % (ref 11.5–15.5)
WBC: 6.8 10*3/uL (ref 4.0–10.5)
nRBC: 0 % (ref 0.0–0.2)

## 2021-11-29 LAB — BASIC METABOLIC PANEL
Anion gap: 8 (ref 5–15)
BUN: 38 mg/dL — ABNORMAL HIGH (ref 8–23)
CO2: 29 mmol/L (ref 22–32)
Calcium: 8.9 mg/dL (ref 8.9–10.3)
Chloride: 101 mmol/L (ref 98–111)
Creatinine, Ser: 2.78 mg/dL — ABNORMAL HIGH (ref 0.44–1.00)
GFR, Estimated: 18 mL/min — ABNORMAL LOW (ref >60)
Glucose, Bld: 153 mg/dL — ABNORMAL HIGH (ref 70–99)
Potassium: 4.6 mmol/L (ref 3.5–5.1)
Sodium: 138 mmol/L (ref 135–145)

## 2021-11-29 LAB — MAGNESIUM: Magnesium: 2.1 mg/dL (ref 1.7–2.4)

## 2021-11-29 NOTE — Progress Notes (Signed)
Nutrition Follow-up ? ?DOCUMENTATION CODES:  ? ?Morbid obesity ? ?INTERVENTION:  ? ?Continue Multivitamin w/ minerals daily ?Continue Snacks ?Double Protein with all meals  ?Discontinue ProSource Plus  ? ?NUTRITION DIAGNOSIS:  ? ?Increased nutrient needs related to acute illness as evidenced by estimated needs. - Ongoing  ? ?GOAL:  ? ?Patient will meet greater than or equal to 90% of their needs - Ongoing  ? ?MONITOR:  ? ?PO intake, Labs, Weight trends, I & O's ? ?REASON FOR ASSESSMENT:  ? ?Consult ?Assessment of nutrition requirement/status ? ?ASSESSMENT:  ? ?Pt admitted with SOB secondary to acute on chronic CHF. PMH significant for T2DM, obesity, afib on Eliquid, CHF, DKA, COPD and CKD stage IV. ? ?Pt sitting in bed, just finished eating lunch. Pt reports that her lunch was good and that she was still hungry. States that she does not get enough food.  ?Per EMR, pt meal intakes include: ?4/6: Lunch 80%, Dinner 100% ?4/7: Lunch 80%, Dinner 100% ?4/8: Breakfast 100% ?4/9: Breakfast 90%, Lunch 100% ?4/10: Dinner 100% ? ?Pt reports that she is drinking the ProSource Plus supplement that are ordered. Per EMR, pt is refusing the supplement every time that it is offered.  ? ?Pt with a 2 pm and 8 pm snack ordered. Reached out to RN to verify if pt has been getting snacks and RN was unaware that they were ordered.  ? ?Medications reviewed and include: MVI, Protonix, Potassium Chloride ?Labs reviewed: BUN 38, Creatinine 2.78 ? ?UOP: 1300 mL x 24 hr ?I/O: -13229 mL since admit  ? ?Diet Order:   ?Diet Order   ? ?       ?  Diet Carb Modified Fluid consistency: Thin; Room service appropriate? Yes; Fluid restriction: 1200 mL Fluid  Diet effective now       ?  ? ?  ?  ? ?  ? ? ?EDUCATION NEEDS:  ? ?No education needs have been identified at this time ? ?Skin:  Skin Assessment: Reviewed RN Assessment ? ?Last BM:  4/8 ? ?Height:  ? ?Ht Readings from Last 1 Encounters:  ?11/22/21 '5\' 7"'$  (1.702 m)  ? ? ?Weight:  ? ?Wt Readings from  Last 1 Encounters:  ?11/29/21 133.2 kg  ? ? ?Ideal Body Weight:  61.4 kg ? ?BMI:  Body mass index is 45.98 kg/m?. ? ?Estimated Nutritional Needs:  ? ?Kcal:  2000-2200 ? ?Protein:  100-115 grams ? ?Fluid:  >/= 2 L ? ? ? ?Hermina Barters RD, LDN ?Clinical Dietitian ?See AMiON for contact information.  ? ?

## 2021-11-29 NOTE — Progress Notes (Signed)
?Progress Note ? ? ?PatientDeshante Gallagher HAL:[id number] DOB: 10-Oct-1955 DOA: 11/21/2021     7 ?DOS: the patient was seen and examined on 11/29/2021 ?  ?Brief hospital course: ?[name] was admitted to the hospital with the working diagnosis of decompensated heart failure. ? 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 Reported edema of arms and legs for 2 weeks, and dyspnea for 3 days into her hospitalization. Positive weight gain.  ?Frequent hospitalizations for heart failure, recent one on 08/2021, placed on daily torsemide and 3 time per week metolazone.  ? ?On her initial physical examination her blood pressure was 130/85, HR 142, RR 28, 02 saturation 99%, heart S1 and S2 present, irregularly irregular, tachycardic, lungs with no wheezing, but positive rales, abdomen soft and no lower extremity edema.  ? ?Na 139, K 3,6, CL 105, bicarbonate 24, glucose 147, BUN 16 and CR 2.0 ?BNP 534 ?Wbc 6.5 hgb 10,3 hct 33.0 plt 225  ?Sars covid 19 negative ? ?Chest radiograph with cardiomegaly, bilateral hilar vascular congestion, no infiltrates.  ? ?EKG 105 bpm, right axis deviation, Qtc 565, atrial fibrillation rhythm, with no significant ST segment changes, negative T wave lead I and AVL.  ? ?Patient placed on IV furosemide for diuresis.  ?04/06 biventricular failure with elevated pulmonary pressures and wedge pressure. ?Placed on inotropic support with milrinone and furosemide drip.  ?04/07 TEE cardioversion converting to sinus rhythm.  ? ?04/10 patient with acute altered mental status, slurring speech and stuttering.  ?CODE stroke was called and MRI was ordered.  ?CVA ruled out, possible medication induced neurologic side effects (amiodarone or gabapentin).  ? ?Assessment and Plan: ?* Acute on chronic systolic CHF (congestive heart failure) ([name]) ?Echocardiogram with  reduced LV systolic function 35 to 73% with global hypokinesis, interventricular septum is flattened and systole and diastole. Reduced RV systolic function, moderate. RVSP 46.9. sever dilatation of right and left atriums. Severe tricuspid valve regurgitation.  ? ?Cardiac catheterization with PA 67/37, mean 45, PCW 30, with cardiac output 6,2 and index 2,5 ?Consistent with biventricular failure, with pulmonary hypertension ?Pulmonary hypertension.  ? ?Urine output over last 24 hrs is 1,300 ml ?Systolic blood pressure 532 and 96 mmHg.   ? ?Had metolazone 04/07.  ?Milrinone and furosemide now have been discontinued.  ?Continue blood pressure monitoring, her volume status is negative 13,229 ml since admission.  ? ? ?AF (paroxysmal atrial fibrillation) ([name]) ?Discontinue amiodarone  ?Continue anticoagulation with apixaban ? ?Continue on sinus rhythm sp direct current cardioversion.  ? ?Acute kidney injury superimposed on chronic kidney disease ([name]) ?CKD stage 4, Hypokalemia. ? ?Improved volume status, renal function with serum cr at 2,78 with K at 4,6 and serum bicarbonate at 29.  ? ?Furosemide has been discontinued. ?Continue close follow up on renal function and electrolytes.  ? ? ?Type 2 diabetes mellitus with hyperlipidemia ([name]) ?Fasting glucose is 153 mg/dl.  ?No insulin therapy. ?Continue with statin therapy.   ? ?Acute mastoiditis, left ?Completed 7 days of Augmentin.  ? ?Class 3 obesity ([name]) ?Calculated BMI is 51,9  ? ?Altered mental status ?CVA has been ruled out.  ?Stuttering has improved, possible medication related to amiodarone or gabapentin. ?Continue close neuro checks, PT and OT.   ? ? ? ? ? ?  ? ?Subjective: Patient is feeling better but still has difficulty speaking no chest pain, edema improving, no dyspnea  ? ?  Physical Exam: ?Vitals:  ? 11/28/21 2031 11/29/21 0414 11/29/21 0800 11/29/21 1352  ?BP: 131/68 132/70 (!) 129/57 96/84  ?Pulse: 97 91 91 85  ?Resp: (!) '23 20 20 18  '$ ?Temp: 98.8 ?F (37.1 ?C)  98.8 ?F (37.1 ?C) 97.6 ?F (36.4 ?C) 97.8 ?F (36.6 ?C)  ?TempSrc: Oral Oral Oral Oral  ?SpO2: 95% 95% 95% 96%  ?Weight:  133.2 kg    ?Height:      ? ?Neurology non focal, awake and alert, positive difficulty speaking ?ENT with no pallor ?Cardiovascular with S1 and S2 present and rhythmic with no gallops or murmurs ?Respiratory with no rales or wheezing ?Abdomen protuberant not distended ?Positive non pitting lower extremity edema  ?Data Reviewed: ? ? ? ?Family Communication: I spoke with patient's daughter at the bedside, we talked in detail about patient's condition, plan of care and prognosis and all questions were addressed. ? ? ?Disposition: ?Status is: Inpatient ?Remains inpatient appropriate because: heart failure  ? Planned Discharge Destination: Home ? ? ? ?Author: ?Tawni Millers, MD ?11/29/2021 2:02 PM ? ?For on call review [email or web address].  ?

## 2021-11-29 NOTE — TOC Initial Note (Signed)
Transition of Care (TOC) - Initial/Assessment Note  ? ? ?Patient Details  ?Name: [name] ?MRN: [id number] ?Date of Birth: Feb 08, 1956 ? ?Transition of Care [facility]) CM/SW Contact:    ?Marcheta Grammes Rexene Alberts, RN ?Phone Number: [phone] ?11/29/2021, 4:49 PM ? ?Clinical Narrative:                 ? ?HF TOC CM spoke to pt at bedside. States her dtr and grandchildren in home to assist with care. Pt states she has Rollator and bedside commode in the home. Declined Rolling Walker. Offered choice for [facility]. Pt states she had [facility] in the past. Contacted [facility] rep, Marjory Lies and they accepted referral for [facility]. Will need orders for HHPT with F2F.  ? ? ?Expected Discharge Plan: [place] ?Barriers to Discharge: Continued Medical Work up ? ? ?Patient Goals and CMS Choice ?Patient states their goals for this hospitalization and ongoing recovery are:: wants to go home and get back to her baseline ?CMS Medicare.gov Compare Post Acute Care list provided to:: Patient ?Choice offered to / list presented to : Patient ? ?Expected Discharge Plan and Services ?Expected Discharge Plan: [place] ?  ?Discharge Planning Services: CM Consult ?Post Acute Care Choice: Home Health ?Living arrangements for the past 2 months: [place] ? ?  ?[place] Agency: [facility] ?Date HH Agency Contacted: 11/29/21 ?Time [place]: [id number] ?Representative spoke with at [place]: Romie Jumper ? ?Prior Living Arrangements/Services ?Living arrangements for the past 2 months: [place] ?Lives with:: Adult Children ?Patient language and need for interpreter reviewed:: Yes ?Do you feel safe going back to the place where you live?: Yes      ?Need for Family Participation in Patient Care: Yes (Comment) ?Care giver support system in place?: Yes (comment) ?Current home services: DME (rollator, bedside commode) ?Criminal Activity/Legal Involvement Pertinent to Current  Situation/Hospitalization: No - Comment as needed ? ?Activities of Daily Living ?Home Assistive Devices/Equipment: Bedside commode/3-in-1, Cane (specify quad or straight), Walker (specify type), Shower chair with back ?ADL Screening (condition at time of admission) ?Patient's cognitive ability adequate to safely complete daily activities?: Yes ?Is the patient deaf or have difficulty hearing?: No ?Does the patient have difficulty seeing, even when wearing glasses/contacts?: No ?Does the patient have difficulty concentrating, remembering, or making decisions?: No ?Patient able to express need for assistance with ADLs?: Yes ?Does the patient have difficulty dressing or bathing?: No ?Independently performs ADLs?: Yes (appropriate for developmental age) ?Does the patient have difficulty walking or climbing stairs?: Yes ?Weakness of Legs: Both ?Weakness of Arms/Hands: None ? ?Permission Sought/Granted ?Permission sought to share information with : Case Manager, Family Supports, PCP ?Permission granted to share information with : Yes, Verbal Permission Granted ? Share Information with NAME: Ulyess Mort ? Permission granted to share info w AGENCY: [facility] ? Permission granted to share info w Relationship: daugher ? Permission granted to share info w Contact Information: [phone] ? ?Emotional Assessment ?Appearance:: Appears stated age ?Attitude/Demeanor/Rapport: Engaged ?Affect (typically observed): Accepting ?Orientation: : Oriented to Self, Oriented to Place, Oriented to  Time, Oriented to Situation ?  ?Psych Involvement: No (comment) ? ?Admission diagnosis:  Shortness of breath [R06.02] ?CHF exacerbation ([place]) [I50.9] ?Acute on chronic congestive heart failure, unspecified heart failure type ([place]) [I50.9] ?Patient Active Problem List  ? Diagnosis Date Noted  ? Altered mental status 11/28/2021  ? Acute mastoiditis, left 11/21/2021  ? Abnormal weight loss 11/16/2021  ?  Personal history of colonic polyps  11/16/2021  ? Rib pain 11/16/2021  ? Atrial flutter with rapid ventricular response ([place]) 08/26/2021  ? 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 Anxiety and depression 08/26/2021  ? Essential hypertension 08/26/2021  ? GERD (gastroesophageal reflux disease) 08/26/2021  ? COPD (chronic obstructive pulmonary disease) ([place]) 07/29/2021  ? Prolonged QT interval 07/29/2021  ? Acute systolic CHF (congestive heart failure) ([place]) 07/28/2021  ? Biventricular heart failure ([place])   ? Acute on chronic systolic heart failure ([place]) 02/25/2021  ? Atrial flutter ([place])   ? 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  ? 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  ? Encounter for preoperative dental examination   ? Teeth missing   ? Gingivitis   ? Accretions on teeth   ? Acute on chronic systolic CHF (congestive heart failure) ([place]) 12/21/2020  ? Acute on chronic heart failure ([place]) 12/19/2020  ? Atrial fibrillation, permanent ([place]) 11/02/2020  ? Acute on chronic combined systolic and diastolic CHF (congestive heart failure) ([place]) 05/06/2020  ? NICM (nonischemic cardiomyopathy) ([place]) 04/01/2020  ? Family history of heart disease 04/01/2020  ? Mitral regurgitation 04/01/2020  ? Severe tricuspid regurgitation 04/01/2020  ? Closed right ankle fracture 09/24/2019  ? DKA, type 2 ([place]) 09/24/2019  ? HHNC (hyperglycemic hyperosmolar nonketotic coma) ([place]) 02/21/2019  ? Hypokalemia 02/21/2019  ? Acute Gallagher injury superimposed on CKD ([place]) 02/21/2019  ? Acute on chronic left systolic heart failure ([place]) 07/01/2018  ? Congestive heart failure with left ventricular diastolic dysfunction, acute ([place]) 07/01/2018  ? Right upper lobe pneumonia 05/14/2018  ? Atrial fibrillation with RVR ([place]) 05/14/2018  ? Chronic atrial fibrillation ([place]) 12/26/2017  ? Diastolic dysfunction 08/29/3233  ? Diabetes mellitus type 2 in obese ([place]) 12/26/2017  ? Nausea and vomiting   ? Chest pain 02/26/2012  ? OSA (obstructive sleep apnea)  09/21/2011  ? Hypoxia 09/21/2011  ? Type 2 diabetes mellitus with hyperlipidemia ([place]) 02/04/2010  ? Class 3 obesity ([place]) 02/04/2010  ? AF (paroxysmal atrial fibrillation) ([place]) 02/04/2010  ? Gastroparesis 02/04/2010  ? Acute gastroenteritis 02/04/2010  ? ?PCP:  [name], NP ?Pharmacy:   ?[id number] [place] [place]?Phone: [phone]: [phone]? ?[facility] - [place] - [address] ?[address] ?[place]?Phone: [phone]: [phone]? ? ? ? ?Social Determinants of Health (SDOH) Interventions ?  ? ?Readmission Risk Interventions ? ?  08/31/2021  ?  1:49 PM 02/10/2021  ? 10:17 AM 01/11/2021  ? 12:21 PM  ?Readmission Risk Prevention Plan  ?Transportation Screening Complete Complete Complete  ?Medication Review (RN Care Manager) Referral to Pharmacy Complete   ?PCP or Specialist appointment within 3-5 days of discharge Complete Complete Complete  ?[place] Consult Complete Complete Complete  ?SW Recovery Care/Counseling Consult Complete Complete Complete  ?Palliative Care Screening Not Applicable Not Applicable Not Applicable  ?[place] Applicable Not Applicable Not Applicable  ? ? ? ?

## 2021-11-29 NOTE — Care Management Important Message (Signed)
Important Message ? ?Patient Details  ?Name: [name] ?MRN: [id number] ?Date of Birth: Apr 13, 1956 ? ? ?Medicare Important Message Given:  Yes ? ? ? ? ?Shelda Altes ?11/29/2021, 8:21 AM ?

## 2021-11-29 NOTE — Progress Notes (Addendum)
? ? Advanced Heart Failure Rounding Note ? ?PCP-Cardiologist: Buford Dresser, MD  ? ?Subjective:   ? ?04/03: Admitted with A fib RVR and A/C HFrEF.  Started on amio drip. Diuresing with IV lasix.   ?04/06: RHC with severely elevated biventricular pressures. Severe RV failure. Started milrinone 0.125 + lasix gtt  ?04/07: S/p DCCV, Afib >>>NSR ?04/10: AMS . Code stroke called. MRI/Head CT negative. ? Related to amio. Diuretics stopped.  ? ?Creatinine trending up 2.45>2.6 (contrast) >2.8  ? ? ?Continues to stutter with some moments of clear speech.  Frustrated. Follows commands.  ? ? ?Objective:   ?Weight Range: ?133.2 kg ?Body mass index is 45.98 kg/m?.  ? ?Vital Signs:   ?Temp:  [97.6 ?F (36.4 ?C)-98.8 ?F (37.1 ?C)] 97.6 ?F (36.4 ?C) (04/11 0800) ?Pulse Rate:  [91-97] 91 (04/11 0800) ?Resp:  [20-23] 20 (04/11 0800) ?BP: (102-136)/(57-81) 129/57 (04/11 0800) ?SpO2:  [94 %-96 %] 95 % (04/11 0800) ?Weight:  [133.2 kg] 133.2 kg (04/11 0414) ?Last BM Date : 11/26/21 ? ?Weight change: ?Filed Weights  ? 11/27/21 0506 11/28/21 0403 11/29/21 0414  ?Weight: 133.2 kg 131.8 kg 133.2 kg  ? ? ?Intake/Output:  ? ?Intake/Output Summary (Last 24 hours) at 11/29/2021 0823 ?Last data filed at 11/29/2021 0425 ?Gross per 24 hour  ?Intake 611.77 ml  ?Output 1300 ml  ?Net -688.23 ml  ?  ? ? ?Physical Exam  ? General:  Walking with therapy.  No resp difficulty ?HEENT: normal ?Neck: supple. no JVD. Carotids 2+ bilat; no bruits. No lymphadenopathy or thryomegaly appreciated. ?Cor: PMI nondisplaced. Regular rate & rhythm. No rubs, gallops or murmurs. ?Lungs: clear ?Abdomen: soft, nontender, nondistended. No hepatosplenomegaly. No bruits or masses. Good bowel sounds. ?Extremities: no cyanosis, clubbing, rash, edema ?Neuro: Intermittent stuttering. alert & orientedx3, cranial nerves grossly intact. moves all 4 extremities w/o difficulty.  ? ? ?Telemetry  ? ?SR 90s, ~ 10 PVCs/min ? ? ?Labs  ?  ?CBC ?Recent Labs  ?  11/29/21 ?0322  ?WBC 6.8   ?HGB 8.5*  ?HCT 27.6*  ?MCV 92.3  ?PLT 219  ? ?Basic Metabolic Panel ?Recent Labs  ?  11/28/21 ?7262 11/29/21 ?0322  ?NA 138 138  ?K 4.0 4.6  ?CL 100 101  ?CO2 29 29  ?GLUCOSE 175* 153*  ?BUN 36* 38*  ?CREATININE 2.60* 2.78*  ?CALCIUM 9.2 8.9  ?MG 2.0 2.1  ? ?Liver Function Tests ?No results for input(s): AST, ALT, ALKPHOS, BILITOT, PROT, ALBUMIN in the last 72 hours. ? ?No results for input(s): LIPASE, AMYLASE in the last 72 hours. ? ?Cardiac Enzymes ?No results for input(s): CKTOTAL, CKMB, CKMBINDEX, TROPONINI in the last 72 hours. ? ? ?BNP: ?BNP (last 3 results) ?Recent Labs  ?  08/31/21 ?0114 09/23/21 ?1554 11/21/21 ?1122  ?BNP 132.7* 31.1 534.8*  ? ? ?ProBNP (last 3 results) ?No results for input(s): PROBNP in the last 8760 hours. ? ? ?D-Dimer ?No results for input(s): DDIMER in the last 72 hours. ? ?Hemoglobin A1C ?No results for input(s): HGBA1C in the last 72 hours. ? ?Fasting Lipid Panel ?No results for input(s): CHOL, HDL, LDLCALC, TRIG, CHOLHDL, LDLDIRECT in the last 72 hours. ?Thyroid Function Tests ?No results for input(s): TSH, T4TOTAL, T3FREE, THYROIDAB in the last 72 hours. ? ?Invalid input(s): FREET3 ? ? ?Other results: ? ? ?Imaging  ? ? ?MR BRAIN WO CONTRAST ? ?Result Date: 11/28/2021 ?CLINICAL DATA:  Neuro deficit, acute, stroke suspected EXAM: MRI HEAD WITHOUT CONTRAST TECHNIQUE: Multiplanar, multiecho pulse sequences of the brain and  surrounding structures were obtained without intravenous contrast. COMPARISON:  2018 FINDINGS: Brain: There is no acute infarction or intracranial hemorrhage. There is no intracranial mass, mass effect, or edema. There is no hydrocephalus or extra-axial fluid collection. Ventricles and sulci are normal in size and configuration. Patchy foci of T2 hyperintensity in the supratentorial white matter are nonspecific but may reflect mild chronic microvascular ischemic changes. Vascular: Major vessel flow voids at the skull base are preserved. Skull and upper cervical  spine: Normal marrow signal is preserved. Sinuses/Orbits: Paranasal sinuses are aerated. Bilateral lens replacements. Other: Sella is unremarkable. Minor patchy mastoid fluid opacification. IMPRESSION: No acute infarction, hemorrhage, or mass. Mild chronic microvascular ischemic changes. Electronically Signed   By: Macy Mis M.D.   On: 11/28/2021 17:23  ? ?CT HEAD CODE STROKE WO CONTRAST ? ?Result Date: 11/28/2021 ?CLINICAL DATA:  Code stroke.  Neuro deficit, acute, stroke suspected EXAM: CT HEAD WITHOUT CONTRAST TECHNIQUE: Contiguous axial images were obtained from the base of the skull through the vertex without intravenous contrast. RADIATION DOSE REDUCTION: This exam was performed according to the departmental dose-optimization program which includes automated exposure control, adjustment of the mA and/or kV according to patient size and/or use of iterative reconstruction technique. COMPARISON:  CT head June 21, 2021. FINDINGS: Brain: No evidence of acute large vascular territory infarction, hemorrhage, hydrocephalus, extra-axial collection or mass lesion/mass effect. Vascular: No hyperdense vessel identified. Skull: No acute fracture.  Hyperostosis frontalis. Sinuses/Orbits: Clear sinuses.  No acute orbital findings. Other: No mastoid effusions ASPECTS [facility] Stroke Program Early CT Score) Total score (0-10 with 10 being normal): 10. IMPRESSION: 1. No evidence of acute intracranial abnormality. 2. ASPECTS is 10. Code stroke imaging results were communicated on 11/28/2021 at 8:45 am to provider Dr. Lorrin Goodell via secure text paging. Electronically Signed   By: Margaretha Sheffield M.D.   On: 11/28/2021 08:45  ? ?CT ANGIO HEAD NECK W WO CM (CODE STROKE) ? ?Result Date: 11/28/2021 ?CLINICAL DATA:  Neuro deficit, acute, stroke suspected EXAM: CT ANGIOGRAPHY HEAD AND NECK TECHNIQUE: Multidetector CT imaging of the head and neck was performed using the standard protocol during bolus administration of intravenous  contrast. Multiplanar CT image reconstructions and MIPs were obtained to evaluate the vascular anatomy. Carotid stenosis measurements (when applicable) are obtained utilizing NASCET criteria, using the distal internal carotid diameter as the denominator. RADIATION DOSE REDUCTION: This exam was performed according to the departmental dose-optimization program which includes automated exposure control, adjustment of the mA and/or kV according to patient size and/or use of iterative reconstruction technique. CONTRAST:  50m OMNIPAQUE IOHEXOL 350 MG/ML SOLN COMPARISON:  None. FINDINGS: CTA NECK FINDINGS Aortic arch: Great vessel origins are patent without significant stenosis. Right carotid system: No evidence of dissection, stenosis (50% or greater) or occlusion. Mild atherosclerosis at the carotid bifurcation. Retropharyngeal course. Left carotid system: No evidence of dissection, stenosis (50% or greater) or occlusion. Mild-to-moderate atherosclerosis at the carotid bifurcation without greater than 50% narrowing. Vertebral arteries: Codominant. No evidence of dissection, stenosis (50% or greater) or occlusion. Skeleton: No acute findings. Other neck: Please see recent CT neck for better characterization findings in the neck. Upper chest: Mild mosaic ground-glass opacities in the lung apices, better characterized on recent CT chest. Review of the MIP images confirms the above findings CTA HEAD FINDINGS Anterior circulation: Bilateral intracranial ICAs, MCAs, and ACAs are patent without proximal hemodynamically significant stenosis Posterior circulation: Bilateral intradural vertebral arteries, basilar artery, and posterior cerebral arteries are patent without proximal hemodynamically significant stenosis.  Venous sinuses: As permitted by contrast timing, patent. Review of the MIP images confirms the above findings IMPRESSION: 1. No large vessel occlusion or proximal hemodynamically significant stenosis in the head or  neck. 2. Please see recent CT neck and CT chest for soft tissue neck and intrathoracic findings. Findings communicated via secure text page to Dr. Lorrin Goodell [id number] a a at 9:02 AM. Electronically Signed   B

## 2021-11-29 NOTE — Progress Notes (Signed)
NEUROLOGY CONSULTATION PROGRESS NOTE  ? ?Date of service: November 29, 2021 ?Patient Name: [name] ?MRN:  [id number] ?DOB:  1956/05/28 ? ?Brief HPI  ?[name] is a 66 y.o. female admitted with acute on chronic systolic CHF, A-fib on amiodarone gtt., AKI on CKD 4. She had acute onset stuttering speech that seems intermittent. ?  ?Interval Hx  ? ?MRI brain is negative for an acute stroke. Sitting outside her room in the rolator walker and working with therapy. Speech still stutters but no focal deficit. Can read time on my watch without any issues. ? ?Vitals  ? ?Vitals:  ? 11/28/21 1122 11/28/21 2031 11/29/21 0414 11/29/21 0800  ?BP: 102/68 131/68 132/70 (!) 129/57  ?Pulse: 93 97 91 91  ?Resp: 20 (!) '23 20 20  '$ ?Temp: 98.7 ?F (37.1 ?C) 98.8 ?F (37.1 ?C) 98.8 ?F (37.1 ?C) 97.6 ?F (36.4 ?C)  ?TempSrc: Oral Oral Oral Oral  ?SpO2: 96% 95% 95% 95%  ?Weight:   133.2 kg   ?Height:      ?  ? ?Body mass index is 45.98 kg/m?. ? ?Physical Exam  ? ?General: Laying comfortably in bed; in no acute distress.  ?HENT: Normal oropharynx and mucosa. Normal external appearance of ears and nose.  ?Neck: Supple, no pain or tenderness  ?CV: No JVD. No peripheral edema.  ?Pulmonary: Symmetric Chest rise. Normal respiratory effort.  ?Abdomen: Soft to touch, non-tender.  ?Ext: No cyanosis, edema, or deformity  ?Skin: No rash. Normal palpation of skin.   ?Musculoskeletal: Normal digits and nails by inspection. No clubbing.  ? ?Neurologic Examination  ?Mental status/Cognition: Alert, oriented to self, place, month and year, good attention.  ?Speech/language: Fluent, comprehension intact, object naming intact, repetition intact.  ?Cranial nerves:  ? CN II Pupils equal and reactive to light, no VF deficits   ? CN III,IV,VI EOM intact, no gaze preference or deviation, no nystagmus   ? CN V normal sensation in V1, V2, and V3 segments bilaterally   ? CN VII no asymmetry, no nasolabial fold flattening   ? CN VIII normal hearing to  speech   ? CN IX & X normal palatal elevation, no uvular deviation   ? CN XI 5/5 head turn and 5/5 shoulder shrug bilaterally   ? CN XII midline tongue protrusion   ? ?Motor:  ?Muscle bulk: normal, tone normal, pronator drift none, has some jaw tremor. ?Mvmt Root Nerve  Muscle Right Left Comments  ?SA C5/6 Ax Deltoid     ?EF C5/6 Mc Biceps     ?EE C6/7/8 Rad Triceps     ?WF C6/7 Med FCR     ?WE C7/8 PIN ECU     ?F Ab C8/T1 U ADM/FDI 5 5   ?HF L1/2/3 Fem Illopsoas 4 4   ?KE L2/3/4 Fem Quad     ?DF L4/5 D Peron Tib Ant     ?PF S1/2 Tibial Grc/Sol     ? ?Sensation: ? Light touch Intact throughout  ? Pin prick   ? Temperature   ? Vibration   ?Proprioception   ? ?Coordination/Complex Motor:  ?- Finger to Nose intact BL ?- Gait: deferred ? ?Labs  ? ?Basic Metabolic Panel:  ?Lab Results  ?Component Value Date  ? NA 138 11/29/2021  ? K 4.6 11/29/2021  ? CO2 29 11/29/2021  ? GLUCOSE 153 (H) 11/29/2021  ? BUN 38 (H) 11/29/2021  ? CREATININE 2.78 (H) 11/29/2021  ? CALCIUM 8.9 11/29/2021  ? GFRNONAA 18 (L) 11/29/2021  ?  GFRAA 33 (L) 09/08/2020  ? ?HbA1c:  ?Lab Results  ?Component Value Date  ? HGBA1C 6.7 (H) 11/21/2021  ? ?LDL:  ?Lab Results  ?Component Value Date  ? [place] 91 05/03/2021  ? ?Urine Drug Screen:  ?   ?Component Value Date/Time  ? LABOPIA NONE DETECTED 11/21/2021 1352  ? COCAINSCRNUR NONE DETECTED 11/21/2021 1352  ? LABBENZ NONE DETECTED 11/21/2021 1352  ? AMPHETMU NONE DETECTED 11/21/2021 1352  ? THCU NONE DETECTED 11/21/2021 1352  ? LABBARB NONE DETECTED 11/21/2021 1352  ?  ?Alcohol Level No results found for: ETH ?No results found for: PHENYTOIN, ZONISAMIDE, LAMOTRIGINE, LEVETIRACETA ?No results found for: PHENYTOIN, PHENOBARB, VALPROATE, CBMZ ? ?Imaging and Diagnostic studies  ? ?MRI Brain without contrast: ?No acute infarction, hemorrhage, or mass. ?  ?Mild chronic microvascular ischemic changes. ? ?Impression  ? ?[name] is a 66 y.o. female admitted with acute on chronic systolic CHF, A-fib on  amiodarone gtt., AKI on CKD 4. She had acute onset stuttering speech that seems intermittent. Workup with MRI Brain is negative for an acute stroke. Has some jaw tremor, specifically when trying to produce speech and stutters without any dysarthria. ? ?Suspect that this is likely functional neurological deficit, another consideration is amiodarone induced tremor but less likely since I would not expect it to cause a jaw tremor. Unlikely to be a seizure. ? ?Recommendations  ?- Recommend speech therapy. No further neurological workup. We will signoff. ?________________________________________________________________ ? ?Plan discussed with Dr. Cathlean Sauer over secure chat. ? ? ?Thank you for the opportunity to take part in the care of this patient. If you have any further questions, please contact the neurology consultation attending. ? ?Signed, ? ?Donnetta Simpers ?[facility] ?Pager Number [phone] ? ?

## 2021-11-29 NOTE — Progress Notes (Signed)
Physical Therapy Treatment ?Patient Details ?Name: [name] ?MRN: [id number] ?DOB: [birth year] ?Today's Date: 11/29/2021 ? ? ?History of Present Illness This 66 y.o. female presenting on 4/3 with SOB and chest pain.  Found with acute on chronic systolic CHF.  Code stroke called 4/10 due to speech difficulties. CT and MRI-negative. 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 ? ?  ?PT Comments  ? ? Patient progressing slowly towards PT goals. Continues to have stuttering like speech; some words are intelligible and some are not. Stroke is ruled out. Also noted to be tremulous during mobility throughout trunk and UEs/LEs. RR up to 37 with mobility and Sp02 >94% on RA. Requires seated rest break due to SOB and fatigue. Pt visibly upset about new speech deficits. Will continue to follow and progress as tolerated. ?  ?Recommendations for follow up therapy are one component of a multi-disciplinary discharge planning process, led by the attending physician.  Recommendations may be updated based on patient status, additional functional criteria and insurance authorization. ? ?Follow Up Recommendations ? Home health PT ?  ?  ?Assistance Recommended at Discharge Intermittent Supervision/Assistance  ?Patient can return home with the following A little help with bathing/dressing/bathroom;Help with stairs or ramp for entrance;Assist for transportation;Assistance with cooking/housework;A little help with walking and/or transfers ?  ?Equipment Recommendations ? None recommended by PT  ?  ?Recommendations for Other Services   ? ? ?  ?Precautions / Restrictions Precautions ?Precautions: Fall;Other (comment) ?Precaution Comments: watch HR ?Restrictions ?Weight Bearing Restrictions: No  ?  ? ?Mobility ? Bed Mobility ?Overal bed mobility: Needs  Assistance ?Bed Mobility: Supine to Sit ?  ?  ?Supine to sit: Supervision, HOB elevated ?  ?  ?General bed mobility comments: No assist needed, use of rail. ?  ? ?Transfers ?Overall transfer level: Needs assistance ?Equipment used: Rollator (4 wheels) ?Transfers: Sit to/from Stand ?Sit to Stand: Min guard ?  ?  ?  ?  ?  ?General transfer comment: minG for safety, cues for hand placement on rollator, stable with rise from EOB, tremulous ?  ? ?Ambulation/Gait ?Ambulation/Gait assistance: Min guard ?Gait Distance (Feet): 80 Feet (x2 bouts) ?Assistive device: Rollator (4 wheels) ?Gait Pattern/deviations: Step-through pattern, Decreased stride length, Trunk flexed ?Gait velocity: decreased ?  ?  ?General Gait Details: Slow, mild tremulous gait with 1 seated rest break due to 2/4 DOE.  RR 37 at its highest and Sp02 >94% on RA. Lateral sway present. ? ? ?Stairs ?  ?  ?  ?  ?  ? ? ?Wheelchair Mobility ?  ? ?Modified Rankin (Stroke Patients Only) ?  ? ? ?  ?Balance Overall balance assessment: Needs assistance ?Sitting-balance support: Feet supported, No upper extremity supported ?Sitting balance-Leahy Scale: Good ?  ?  ?Standing balance support: During functional activity ?Standing balance-Leahy Scale: Poor ?Standing balance comment: relies on BUE support ?  ?  ?  ?  ?  ?  ?  ?  ?  ?  ?  ?  ? ?  ?Cognition Arousal/Alertness: Awake/alert ?Behavior During Therapy: [facility] for tasks assessed/performed ?Overall Cognitive Status: Within Functional Limits for tasks assessed ?  ?  ?  ?  ?  ?  ?  ?  ?  ?  ?  ?  ?  ?  ?  ?  ?General Comments: Difficult to assess  cognition today due to stuttering speech, some words intellgible and some not. Able to follow commands well without difficulty. Visibly upset about speech changes. ?  ?  ? ?  ?Exercises   ? ?  ?General Comments General comments (skin integrity, edema, etc.): VSS on RA. ?  ?  ? ?Pertinent Vitals/Pain Pain Assessment ?Pain Assessment: Faces ?Faces Pain Scale: Hurts a little  bit ?Pain Location: head ?Pain Descriptors / Indicators: Headache ?Pain Intervention(s): Monitored during session  ? ? ?Home Living   ?  ?  ?  ?  ?  ?  ?  ?  ?  ?   ?  ?Prior Function    ?  ?  ?   ? ?PT Goals (current goals can now be found in the care plan section) Progress towards PT goals: Progressing toward goals ? ?  ?Frequency ? ? ? Min 3X/week ? ? ? ?  ?PT Plan Current plan remains appropriate  ? ? ?Co-evaluation   ?  ?  ?  ?  ? ?  ?AM-PAC PT "6 Clicks" Mobility   ?Outcome Measure ? Help needed turning from your back to your side while in a flat bed without using bedrails?: A Little ?Help needed moving from lying on your back to sitting on the side of a flat bed without using bedrails?: A Little ?Help needed moving to and from a bed to a chair (including a wheelchair)?: A Little ?Help needed standing up from a chair using your arms (e.g., wheelchair or bedside chair)?: A Little ?Help needed to walk in hospital room?: A Little ?Help needed climbing 3-5 steps with a railing? : A Lot ?6 Click Score: 17 ? ?  ?End of Session Equipment Utilized During Treatment: Gait belt ?Activity Tolerance: Patient tolerated treatment well;Patient limited by fatigue ?Patient left: in bed;with call bell/phone within reach;with bed alarm set (sitting EOB) ?Nurse Communication: Mobility status ?PT Visit Diagnosis: Other abnormalities of gait and mobility (R26.89);Unsteadiness on feet (R26.81);Muscle weakness (generalized) (M62.81) ?  ? ? ?Time: 0973-5329 ?PT Time Calculation (min) (ACUTE ONLY): 29 min ? ?Charges:  $Gait Training: 8-22 mins ?$Therapeutic Activity: 8-22 mins          ?          ? ?Marisa Severin, PT, DPT ?Acute Rehabilitation Services ?Secure chat preferred ?Office [phone] ? ? ? ? ? ?[place] ?11/29/2021, 12:38 PM ? ?

## 2021-11-30 DIAGNOSIS — E1169 Type 2 diabetes mellitus with other specified complication: Secondary | ICD-10-CM | POA: Diagnosis not present

## 2021-11-30 DIAGNOSIS — N179 Acute kidney failure, unspecified: Secondary | ICD-10-CM | POA: Diagnosis not present

## 2021-11-30 DIAGNOSIS — I5023 Acute on chronic systolic (congestive) heart failure: Secondary | ICD-10-CM | POA: Diagnosis not present

## 2021-11-30 DIAGNOSIS — I480 Paroxysmal atrial fibrillation: Secondary | ICD-10-CM | POA: Diagnosis not present

## 2021-11-30 LAB — BASIC METABOLIC PANEL
Anion gap: 6 (ref 5–15)
BUN: 38 mg/dL — ABNORMAL HIGH (ref 8–23)
CO2: 29 mmol/L (ref 22–32)
Calcium: 9.1 mg/dL (ref 8.9–10.3)
Chloride: 102 mmol/L (ref 98–111)
Creatinine, Ser: 2.89 mg/dL — ABNORMAL HIGH (ref 0.44–1.00)
GFR, Estimated: 17 mL/min — ABNORMAL LOW (ref >60)
Glucose, Bld: 154 mg/dL — ABNORMAL HIGH (ref 70–99)
Potassium: 4.8 mmol/L (ref 3.5–5.1)
Sodium: 137 mmol/L (ref 135–145)

## 2021-11-30 LAB — CBC
HCT: 28 % — ABNORMAL LOW (ref 36.0–46.0)
Hemoglobin: 8.7 g/dL — ABNORMAL LOW (ref 12.0–15.0)
MCH: 28.3 pg (ref 26.0–34.0)
MCHC: 31.1 g/dL (ref 30.0–36.0)
MCV: 91.2 fL (ref 80.0–100.0)
Platelets: 237 10*3/uL (ref 150–400)
RBC: 3.07 MIL/uL — ABNORMAL LOW (ref 3.87–5.11)
RDW: 14.7 % (ref 11.5–15.5)
WBC: 6.7 10*3/uL (ref 4.0–10.5)
nRBC: 0 % (ref 0.0–0.2)

## 2021-11-30 MED ORDER — AMIODARONE HCL 200 MG PO TABS
200.0000 mg | ORAL_TABLET | Freq: Every day | ORAL | Status: DC
  Administered 2021-12-01 – 2021-12-07 (×7): 200 mg via ORAL
  Filled 2021-11-30 (×7): qty 1

## 2021-11-30 MED ORDER — DULOXETINE HCL 30 MG PO CPEP
30.0000 mg | ORAL_CAPSULE | Freq: Every day | ORAL | Status: DC
  Administered 2021-12-01 – 2021-12-07 (×7): 30 mg via ORAL
  Filled 2021-11-30 (×7): qty 1

## 2021-11-30 MED ORDER — METOPROLOL SUCCINATE ER 25 MG PO TB24
25.0000 mg | ORAL_TABLET | Freq: Every day | ORAL | Status: DC
  Administered 2021-11-30 – 2021-12-07 (×8): 25 mg via ORAL
  Filled 2021-11-30 (×8): qty 1

## 2021-11-30 NOTE — Progress Notes (Signed)
Mobility Specialist Progress Note: ? ? 11/30/21 1400  ?Mobility  ?Activity Ambulated with assistance to bathroom  ?Level of Assistance Standby assist, set-up cues, supervision of patient - no hands on  ?Assistive Device None  ?Distance Ambulated (ft) 30 ft  ?Activity Response Tolerated well  ?$Mobility charge 1 Mobility  ? ?Pt received ambulating to BR independently. No physical assist required, no AD used. Pt back in bed with all needs met.  ? ?Nelta Numbers ?Acute Rehab ?Phone: [phone] ?Office Phone: [phone] ? ?

## 2021-11-30 NOTE — Progress Notes (Addendum)
? ? Advanced Heart Failure Rounding Note ? ?PCP-Cardiologist: Buford Dresser, MD  ? ?Subjective:   ? ?04/03: Admitted with A fib RVR and A/C HFrEF.  Started on amio drip. Diuresing with IV lasix.   ?04/06: RHC with severely elevated biventricular pressures. Severe RV failure. Started milrinone 0.125 + lasix gtt  ?04/07: S/p DCCV, Afib >>>NSR ?04/10: AMS . Code stroke called. MRI/Head CT negative. ? Related to amio. Diuretics stopped.  ? ?Creatinine trending up 2.45>2.6 (contrast) >2.8 >2.9  ? ?Frustrated due to ongoing stuttering. Wants to talk to Dr [name] or Dr Harrell Gave.  ? ?Objective:   ?Weight Range: ?134.2 kg ?Body mass index is 46.34 kg/m?.  ? ?Vital Signs:   ?Temp:  [97.5 ?F (36.4 ?C)-98 ?F (36.7 ?C)] 97.5 ?F (36.4 ?C) (04/12 5631) ?Pulse Rate:  [85-89] 89 (04/12 0748) ?Resp:  [18-24] 24 (04/12 0748) ?BP: (96-141)/(66-84) 140/76 (04/12 0748) ?SpO2:  [96 %-98 %] 98 % (04/12 0748) ?Weight:  [134.2 kg] 134.2 kg (04/12 0550) ?Last BM Date : 11/26/21 ? ?Weight change: ?Filed Weights  ? 11/28/21 0403 11/29/21 0414 11/30/21 0550  ?Weight: 131.8 kg 133.2 kg 134.2 kg  ? ? ?Intake/Output:  ? ?Intake/Output Summary (Last 24 hours) at 11/30/2021 0915 ?Last data filed at 11/30/2021 0500 ?Gross per 24 hour  ?Intake --  ?Output 1100 ml  ?Net -1100 ml  ?  ? ? ?Physical Exam  ? General:  Walking in the hall. No resp difficulty ?HEENT: normal ?Neck: supple. no JVD. Carotids 2+ bilat; no bruits. No lymphadenopathy or thryomegaly appreciated. ?Cor: PMI nondisplaced. Regular rate & rhythm. No rubs, gallops or murmurs. ?Lungs: clear ?Abdomen: soft, nontender, nondistended. No hepatosplenomegaly. No bruits or masses. Good bowel sounds. ?Extremities: no cyanosis, clubbing, rash, edema ?Neuro: Stuttering. Alert & orientedx3, cranial nerves grossly intact. moves all 4 extremities w/o difficulty. Affect flat.  ? ? ?Telemetry  ? ?SR 80-90s with occasional PVCs  ? ? ?Labs  ?  ?CBC ?Recent Labs  ?  11/29/21 ?0322  11/30/21 ?0346  ?WBC 6.8 6.7  ?HGB 8.5* 8.7*  ?HCT 27.6* 28.0*  ?MCV 92.3 91.2  ?PLT 219 237  ? ?Basic Metabolic Panel ?Recent Labs  ?  11/28/21 ?4970 11/29/21 ?2637 11/30/21 ?0346  ?NA 138 138 137  ?K 4.0 4.6 4.8  ?CL 100 101 102  ?CO2 '29 29 29  '$ ?GLUCOSE 175* 153* 154*  ?BUN 36* 38* 38*  ?CREATININE 2.60* 2.78* 2.89*  ?CALCIUM 9.2 8.9 9.1  ?MG 2.0 2.1  --   ? ?Liver Function Tests ?No results for input(s): AST, ALT, ALKPHOS, BILITOT, PROT, ALBUMIN in the last 72 hours. ? ?No results for input(s): LIPASE, AMYLASE in the last 72 hours. ? ?Cardiac Enzymes ?No results for input(s): CKTOTAL, CKMB, CKMBINDEX, TROPONINI in the last 72 hours. ? ? ?BNP: ?BNP (last 3 results) ?Recent Labs  ?  08/31/21 ?0114 09/23/21 ?1554 11/21/21 ?1122  ?BNP 132.7* 31.1 534.8*  ? ? ?ProBNP (last 3 results) ?No results for input(s): PROBNP in the last 8760 hours. ? ? ?D-Dimer ?No results for input(s): DDIMER in the last 72 hours. ? ?Hemoglobin A1C ?No results for input(s): HGBA1C in the last 72 hours. ? ?Fasting Lipid Panel ?No results for input(s): CHOL, HDL, LDLCALC, TRIG, CHOLHDL, LDLDIRECT in the last 72 hours. ?Thyroid Function Tests ?No results for input(s): TSH, T4TOTAL, T3FREE, THYROIDAB in the last 72 hours. ? ?Invalid input(s): FREET3 ? ? ?Other results: ? ? ?Imaging  ? ? ?No results found. ? ? ?Medications:   ? ? ?  Scheduled Medications: ? apixaban  5 mg Oral BID  ? busPIRone  5 mg Oral BID  ? colchicine  0.3 mg Oral Daily  ? DULoxetine  60 mg Oral Daily  ? loratadine  10 mg Oral Daily  ? multivitamin with minerals  1 tablet Oral Daily  ? pantoprazole  40 mg Oral Daily  ? rosuvastatin  10 mg Oral QPM  ? sodium chloride flush  3 mL Intravenous Q12H  ? sodium chloride flush  3 mL Intravenous Q12H  ? sodium chloride flush  3 mL Intravenous Q12H  ? ? ?Infusions: ? sodium chloride    ? sodium chloride    ? ? ?PRN Medications: ?sodium chloride, sodium chloride, acetaminophen **OR** acetaminophen, albuterol, diphenhydrAMINE,  HYDROcodone-acetaminophen, morphine injection, sodium chloride flush, sodium chloride flush ? ? ? ?Patient Profile  ?66 y.o. female with history of morbid obesity, chronic diastolic and systolic HF with mildly reduced LVEF (45-50%), severe MR/TR, atrial fibrillation on Eliquis, HTN, and DMII.  ? ?Admitted with A Fib RVR and A/C HFrEF.  ? ? ? ?Assessment/Plan  ?1. A fib RVR ?- She has failed Tikosyn ?- Maze in 5/22 and DCCV in 6/22 & 04/19/21, last of which she had ERAF. ?- Saw Dr Quentin Ore in February, discussed AV nodal ablation + CRT if recurrent atrial arrhythmias ?- S/p successful DCCV on 04/07.  ?- Amio gtt switched to po amio 400 mg BID on 04/08.  ?- Now off amio with neurologic issues.Add Toprol XL 25 mg daily.  ?- if she goes in A fib again ? Rate control. Maintaining SR.  ?- Continue Eliquis 5 mg BID. ?  ?2. A/C HFrEF,NICM ?- RHC (5/22): elevated R>L filling pressures with PAPi low, suggestive of significant component of RV dysfunction, prominent v-waves in PCWP and RA tracings suggestive of MR/TR, CO low ?- RHC (6/22) with low filling pressures with moderately reduced CO. ?- RHC 07/2021 normal filling pressures and moderately reduced cardiac output.  ?- Echo (7/22) in setting of AFL with RVR, showed EF 30-35%, moderate LVH, moderate RV dysfunction, PASP 48, s/p MV repair with mild MR and mean gradient 10 mmHg with HR around 130, s/p TV repair with mild-moderate TR, dilated IVC.  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  ?- Echo 11/22/21: EF 30-35% RV moderately reduced, RVSP 47 mmHg, severe BAE, mild MS, severe TR despite TV repair ?- RHC 11/24/21: RA 29, PA 67/37 (45), PCW 30 ( v waves to 45), Fick CO/CI 6.2/2.5, PA sat 50%, 55%, PAPi 1.0. Severely elevated biventricular pressures, moderate pulmonary hypertension with marked V waves in PCWP tracing and severe RV failure. ?- Suspect AF contributing to decompensation. S/p successful DCCV on 04/07 as above. ?- Milrinone stopped 4/11.  Volume status stable. Hold  diuretics.  ?- GDMT limited by AKI on CKD, eventually consider spiro and SGLT2i ?- TED hose ? ?3. PVCs ?-< 10 per hour  ?Hopefully burden improves off milrinone ? ?4. Valvular Heart Disease ?-S/p MV and TV repair in 5/22 ?-Echo 04/23: EF 30-35%, RV moderately reduced, severe TR ?-RHC 4/6 .  Severely elevated biventricular pressures, moderate pulmonary hypertension and severe RV failure. PAPi 1.0. ?-Not sure if there are any options for repair ? ?5. CAD/Chest pain ?- trivial CAD on heart cath 2019 ?-No chest pain.  ?- hstrop negative. Doubt ischemic ?  ?6. CKD Stage IV ?- baseline Scr 2-2.5 ?- Scr 2.09>2.46.2.63>2.54>2.45>2.60>2.8 >>2.9  ?- Daily BMET ?  ?7. DMII ?- per primary  ?- consider SGLT2i as renal function stabilizes ?  ?  8. Morbid Obesity  ?- needs weight loss ?- May be candidate for GLP1RA ? ?9. Anemia ?- Baseline 10-12, Hgb 8.7 today ?- No bleeding issues.  ?- Iron stores low. Consider feraheme once treatment for mastoiditis complete. ? ?10. Confusion/slurred speech/tremor. Stuttering ?- 4/10 Code stroke activated.Neurology consulted.  ?-  MRI - no acute findings. CT unrevealing.  ?Has not missed any doses of Eliquis since admission. ?- ? Amiodarone contributing to tremor. Amio stopped 4/11  ?- Continues to stutter. Unclear etiology.   ? ? ? ?Length of Stay: 8 ? ?Darrick Grinder, NP  ?11/30/2021, 9:15 AM ? ?Advanced Heart Failure Team ?Pager [phone] (M-F; 7a - 5p)  ?Please contact [facility] for night-coverage after hours (5p -7a ) and weekends on [email or web address] ? ?Patient seen and examined with the above-signed Advanced Practice Provider and/or Housestaff. I personally reviewed laboratory data, imaging studies and relevant notes. I independently examined the patient and formulated the important aspects of the plan. I have edited the note to reflect any of my changes or salient points. I have personally discussed the plan with the patient and/or family. ? ?Stroke w/u completely negative. Continues to stutter but  can communicate some. Denies CP or SOB. SCr slightly worse. Weight up 2 pounds (off diuretics). Amio stopped. Remains in NSR  ? ?General:  Lying flat in bed No resp difficulty ?HEENT: normal ?Neck: supple. JVP hard to see [place]

## 2021-11-30 NOTE — Progress Notes (Signed)
Physical Therapy Treatment ?Patient Details ?Name: [name] ?MRN: [id number] ?DOB: 01-12-1956 ?Today's Date: 11/30/2021 ? ? ?History of Present Illness This 66 y.o. female presenting on 4/3 with SOB and chest pain.  Found with acute on chronic systolic CHF.  Code stroke called 4/10 due to speech difficulties. CT and MRI-negative. 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 ? ?  ?PT Comments  ? ? Patient progressing well towards PT goals. Pt visibly upset at beginning of session due to continued stuttering speech, seems to be slightly improved at end of session with clear words being spoken. Improved ambulation distance today with use of rollator and Min guard assist; needed 2 seated rest breaks due to fatigue. Strength is improving. VSS on RA. Will follow. ?   ?Recommendations for follow up therapy are one component of a multi-disciplinary discharge planning process, led by the attending physician.  Recommendations may be updated based on patient status, additional functional criteria and insurance authorization. ? ?Follow Up Recommendations ? Home health PT ?  ?  ?Assistance Recommended at Discharge Intermittent Supervision/Assistance  ?Patient can return home with the following A little help with bathing/dressing/bathroom;Help with stairs or ramp for entrance;Assist for transportation;Assistance with cooking/housework;A little help with walking and/or transfers ?  ?Equipment Recommendations ? None recommended by PT  ?  ?Recommendations for Other Services   ? ? ?  ?Precautions / Restrictions Precautions ?Precautions: Fall;Other (comment) ?Precaution Comments: watch HR ?Restrictions ?Weight Bearing Restrictions: No  ?  ? ?Mobility ? Bed Mobility ?  ?  ?  ?  ?  ?  ?  ?General bed mobility comments: Sitting on BSC at sink upon  PT arrival. ?  ? ?Transfers ?Overall transfer level: Needs assistance ?Equipment used: Rollator (4 wheels) ?Transfers: Sit to/from Stand ?Sit to Stand: Min guard ?  ?  ?  ?  ?  ?General transfer comment: minG for safety, stood from [facility] x1, from rollator seat x2, transferred to chair post ambulation. ?  ? ?Ambulation/Gait ?Ambulation/Gait assistance: Min guard ?Gait Distance (Feet): 120 Feet (+ 50' + 100') ?Assistive device: Rollator (4 wheels) ?Gait Pattern/deviations: Step-through pattern, Decreased stride length, Trunk flexed ?Gait velocity: 1.38 ft/sec ?Gait velocity interpretation: 1.31 - 2.62 ft/sec, indicative of limited community ambulator ?  ?General Gait Details: Slow, steady gait with RW, no tremors noted today. 2 seated rest breaks. RR up to 37, otherwise VSS with Sp02 >93% on RA and HR stable in 70s-80s bpm. ? ? ?Stairs ?  ?  ?  ?  ?  ? ? ?Wheelchair Mobility ?  ? ?Modified Rankin (Stroke Patients Only) ?  ? ? ?  ?Balance Overall balance assessment: Needs assistance ?Sitting-balance support: Feet supported, No upper extremity supported ?Sitting balance-Leahy Scale: Good ?Sitting balance - Comments: total A to donn socks ?  ?Standing balance support: During functional activity ?Standing balance-Leahy Scale: Poor ?Standing balance comment: relies on at least 1 UE support ?  ?  ?  ?  ?  ?  ?  ?  ?  ?  ?  ?  ? ?  ?Cognition Arousal/Alertness: Awake/alert ?Behavior During Therapy: [facility] for tasks assessed/performed ?Overall Cognitive Status: Within Functional Limits for tasks assessed ?  ?  ?  ?  ?  ?  ?  ?  ?  ?  ?  ?  ?  ?  ?  ?  ?  General Comments: Upset about her continued stuttering speech, only wanting to speak with her cardiology MDs and no one else. Speech had moments of clarity with words at end of session. ?  ?  ? ?  ?Exercises   ? ?  ?General Comments General comments (skin integrity, edema, etc.): VSS on RA. Sp02 >93%, HR 70-80s bpm ?  ?  ? ?Pertinent Vitals/Pain Pain Assessment ?Pain Assessment:  Faces ?Faces Pain Scale: Hurts a little bit ?Pain Location: head, feet ?Pain Descriptors / Indicators: Headache, Sore, Discomfort ?Pain Intervention(s): Monitored during session  ? ? ?Home Living   ?  ?  ?  ?  ?  ?  ?  ?  ?  ?   ?  ?Prior Function    ?  ?  ?   ? ?PT Goals (current goals can now be found in the care plan section) Progress towards PT goals: Progressing toward goals ? ?  ?Frequency ? ? ? Min 3X/week ? ? ? ?  ?PT Plan Current plan remains appropriate  ? ? ?Co-evaluation   ?  ?  ?  ?  ? ?  ?AM-PAC PT "6 Clicks" Mobility   ?Outcome Measure ? Help needed turning from your back to your side while in a flat bed without using bedrails?: A Little ?Help needed moving from lying on your back to sitting on the side of a flat bed without using bedrails?: A Little ?Help needed moving to and from a bed to a chair (including a wheelchair)?: A Little ?Help needed standing up from a chair using your arms (e.g., wheelchair or bedside chair)?: A Little ?Help needed to walk in hospital room?: A Little ?Help needed climbing 3-5 steps with a railing? : A Lot ?6 Click Score: 17 ? ?  ?End of Session   ?Activity Tolerance: Patient tolerated treatment well;Patient limited by fatigue ?Patient left: in chair;with call bell/phone within reach ?Nurse Communication: Mobility status ?PT Visit Diagnosis: Other abnormalities of gait and mobility (R26.89);Unsteadiness on feet (R26.81);Muscle weakness (generalized) (M62.81) ?  ? ? ?Time: [phone] ?PT Time Calculation (min) (ACUTE ONLY): 31 min ? ?Charges:  $Gait Training: 8-22 mins ?$Therapeutic Exercise: 8-22 mins          ?          ? ?Marisa Severin, PT, DPT ?Acute Rehabilitation Services ?Secure chat preferred ?Office [phone] ? ? ? ? ? ?[place] ?11/30/2021, 10:36 AM ? ?

## 2021-11-30 NOTE — Progress Notes (Signed)
?Progress Note ? ? ?PatientAdreena Gallagher QMV:[id number] DOB: February 16, 1956 DOA: 11/21/2021     8 ?DOS: the patient was seen and examined on 11/30/2021 ?  ?Brief hospital course: ?[name] was admitted to the hospital with the working diagnosis of decompensated heart failure. ? 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 Reported edema of arms and legs for 2 weeks, and dyspnea for 3 days into her hospitalization. Positive weight gain.  ?Frequent hospitalizations for heart failure, recent one on 08/2021, placed on daily torsemide and 3 time per week metolazone.  ? ?On her initial physical examination her blood pressure was 130/85, HR 142, RR 28, 02 saturation 99%, heart S1 and S2 present, irregularly irregular, tachycardic, lungs with no wheezing, but positive rales, abdomen soft and no lower extremity edema.  ? ?Na 139, K 3,6, CL 105, bicarbonate 24, glucose 147, BUN 16 and CR 2.0 ?BNP 534 ?Wbc 6.5 hgb 10,3 hct 33.0 plt 225  ?Sars covid 19 negative ? ?Chest radiograph with cardiomegaly, bilateral hilar vascular congestion, no infiltrates.  ? ?EKG 105 bpm, right axis deviation, Qtc 565, atrial fibrillation rhythm, with no significant ST segment changes, negative T wave lead I and AVL.  ? ?Patient placed on IV furosemide for diuresis.  ?04/06 biventricular failure with elevated pulmonary pressures and wedge pressure. ?Placed on inotropic support with milrinone and furosemide drip.  ?04/07 TEE cardioversion converting to sinus rhythm.  ? ?04/10 patient with acute altered mental status, slurring speech and stuttering.  ?CODE stroke was called and MRI was ordered.  ?CVA ruled out, possible medication induced neurologic side effects (amiodarone or gabapentin).  ? ?04/12 worsening renal failure, continue with stuttering and tremors.  ? ?Assessment and Plan: ?* Acute on chronic  systolic CHF (congestive heart failure) ([place]) ?Echocardiogram with reduced LV systolic function 35 to 28% with global hypokinesis, interventricular septum is flattened and systole and diastole. Reduced RV systolic function, moderate. RVSP 46.9. sever dilatation of right and left atriums. Severe tricuspid valve regurgitation.  ? ?Cardiac catheterization with PA 67/37, mean 45, PCW 30, with cardiac output 6,2 and index 2,5 ?Consistent with biventricular failure, with pulmonary hypertension ?Pulmonary hypertension.  ? ?Furosemide drip was discontinued.  ?Had metolazone 04/07.  ?Patient now off milrinone infusion.  ?Systolic blood pressure 413 to 129 mmHg.  ? ? ?AF (paroxysmal atrial fibrillation) ([place]) ?Discontinue amiodarone  ?Continue anticoagulation with apixaban ? ?Continue on sinus rhythm sp direct current cardioversion.  ?Continue telemetry monitoring.  ? ?Acute kidney injury superimposed on chronic kidney disease ([place]) ?CKD stage 4, Hypokalemia. ? ?Renal function with serum cr at 2,89 with K at 4,8 and serum bicarbonate at 29.  ?Plan to continue holding on furosemide and close monitoring on blood pressure.  ? ?Type 2 diabetes mellitus with hyperlipidemia ([place]) ?Fasting glucose is 154 mg/dl.  ?No insulin therapy. ?Continue with statin therapy.   ? ?Acute mastoiditis, left ?Completed 7 days of Augmentin.  ? ?Class 3 obesity ([place]) ?Calculated BMI is 51,9  ? ?Altered mental status ?CVA has been ruled out.  ?Stuttering and tremors have improved, possible medication related to amiodarone or gabapentin. ? ?Low GFR likely making recovery more slow.  ?Continue neuro checks per unit protocol.  ? ?For depression continue with buspirone. ?Decrease dose of duloxetine. ?Discontinue diphenhydramine and hydrocodone.\ ?Discontinue loratadine, hydrocodone and morphine.  ? ? ? ? ?  ? ?Subjective: Patient with  no chest pain or dyspnea, continue to have tremors and difficulty talking  ? ?Physical Exam: ?Vitals:  ? 11/29/21 2008  11/30/21 0550 11/30/21 0748 11/30/21 1200  ?BP: 137/84 (!) 141/66 140/76 129/76  ?Pulse: 86 86 89 85  ?Resp: 18 18 (!) 24 19  ?Temp: 97.8 ?F (36.6 ?C) 98 ?F (36.7 ?C) (!) 97.5 ?F (36.4 ?C) 98.2 ?F (36.8 ?C)  ?TempSrc: Oral Oral Oral Oral  ?SpO2: 98% 98% 98% 98%  ?Weight:  134.2 kg    ?Height:      ? ?Neurology positive tremors, intentional, with positive stuttering, no focal weakness ?ENT with no pallor ?Cardiovascular with S1 and S2 present and rhythmic with no gallops ?No JVD ?Positive non pitting lower extremity edema  ?Respiratory with no wheezing ?Abdomen protuberant ?Data Reviewed: ? ? ? ?Family Communication: no family at the bedside  ? ?Disposition: ?Status is: Inpatient ?Remains inpatient appropriate because: renal failure  ? Planned Discharge Destination: Home ? ? ? ?Author: ?Tawni Millers, MD ?11/30/2021 2:13 PM ? ?For on call review [email or web address].  ?

## 2021-11-30 NOTE — Progress Notes (Addendum)
Occupational Therapy Treatment ?Patient Details ?Name: [name] ?MRN: [id number] ?DOB: August 05, 1956 ?Today's Date: 11/30/2021 ? ? ?History of present illness This 66 y.o. female presenting on 4/3 with SOB and chest pain.  Found with acute on chronic systolic CHF.  Code stroke called 4/10 due to speech difficulties. CT and MRI-negative. 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 ?  ?OT comments ? Patient seated in recliner and agreeable to OT session.  Focused on LB ADLs, utilize AE to complete LB dressing with min guard assist.  Pt requires min cueing to recall use of sock aide and reacher.  Issued AE to patient, very thankful.  Pt remains frustrated with changes in speech. Will follow acutely.   ? ?Recommendations for follow up therapy are one component of a multi-disciplinary discharge planning process, led by the attending physician.  Recommendations may be updated based on patient status, additional functional criteria and insurance authorization. ?   ?Follow Up Recommendations ? Home health OT  ?  ?Assistance Recommended at Discharge Frequent or constant Supervision/Assistance  ?Patient can return home with the following ? A little help with walking and/or transfers;A little help with bathing/dressing/bathroom;Assistance with cooking/housework;Direct supervision/assist for medications management;Direct supervision/assist for financial management;Help with stairs or ramp for entrance;Assist for transportation ?  ?Equipment Recommendations ? None recommended by OT  ?  ?Recommendations for Other Services Speech consult ? ?  ?Precautions / Restrictions Precautions ?Precautions: Fall;Other (comment) ?Precaution Comments: watch HR ?Restrictions ?Weight Bearing Restrictions: No  ? ? ?  ? ?Mobility Bed Mobility ?Overal bed  mobility: Needs Assistance ?  ?  ?  ?  ?  ?  ?General bed mobility comments: OOB upon entry ?  ? ?Transfers ?  ?  ?  ?  ?  ?  ?  ?  ?  ?  ?  ?  ?Balance Overall balance assessment: Needs assistance ?Sitting-balance support: Feet supported, No upper extremity supported ?Sitting balance-Leahy Scale: Good ?  ?  ?Standing balance support: During functional activity, No upper extremity supported ?Standing balance-Leahy Scale: Poor ?Standing balance comment: min guard during ADLs ?  ?  ?  ?  ?  ?  ?  ?  ?  ?  ?  ?   ? ?ADL either performed or assessed with clinical judgement  ? ?ADL Overall ADL's : Needs assistance/impaired ?  ?  ?  ?  ?  ?  ?Lower Body Bathing: Min guard;Sit to/from stand ?  ?  ?  ?Lower Body Dressing: Min guard;Sit to/from stand;With adaptive equipment ?Lower Body Dressing Details (indicate cue type and reason): using AE, min cueing but able to complete using AE with min guard sit to stand ?  ?  ?  ?  ?  ?  ?  ?General ADL Comments: pt reports already completing mobility today, agreeable to AE education.  Issued AE. ?  ? ?Extremity/Trunk Assessment   ?  ?  ?  ?  ?  ? ?Vision   ?  ?  ?Perception   ?  ?Praxis   ?  ? ?Cognition Arousal/Alertness: Awake/alert ?Behavior During Therapy: [facility] for tasks assessed/performed ?Overall Cognitive Status: Within Functional Limits for tasks assessed ?  ?  ?  ?  ?  ?  ?  ?  ?  ?  ?  ?  ?  ?  ?  ?  ?  ?  ?  ?   ?  Exercises   ? ?  ?Shoulder Instructions   ? ? ?  ?General Comments VSS on RA  ? ? ?Pertinent Vitals/ Pain       Pain Assessment ?Pain Assessment: Faces ?Faces Pain Scale: No hurt ? ?Home Living   ?  ?  ?  ?  ?  ?  ?  ?  ?  ?  ?  ?  ?  ?  ?  ?  ?  ?  ? ?  ?Prior Functioning/Environment    ?  ?  ?  ?   ? ?Frequency ? Min 2X/week  ? ? ? ? ?  ?Progress Toward Goals ? ?OT Goals(current goals can now be found in the care plan section) ? Progress towards OT goals: Progressing toward goals ? ?Acute Rehab OT Goals ?Patient Stated Goal: none stated ?Time For Goal  Achievement: 12/06/21 ?Potential to Achieve Goals: Good  ?Plan Discharge plan remains appropriate;Frequency remains appropriate   ? ?Co-evaluation ? ? ?   ?  ?  ?  ?  ? ?  ?AM-PAC OT "6 Clicks" Daily Activity     ?Outcome Measure ? ? Help from another person eating meals?: None ?Help from another person taking care of personal grooming?: A Little ?Help from another person toileting, which includes using toliet, bedpan, or urinal?: A Little ?Help from another person bathing (including washing, rinsing, drying)?: A Little ?Help from another person to put on and taking off regular upper body clothing?: A Little ?Help from another person to put on and taking off regular lower body clothing?: A Little ?6 Click Score: 19 ? ?  ?End of Session   ? ?OT Visit Diagnosis: Other abnormalities of gait and mobility (R26.89);Muscle weakness (generalized) (M62.81) ?  ?Activity Tolerance Patient tolerated treatment well ?  ?Patient Left in chair;with call bell/phone within reach ?  ?Nurse Communication Mobility status ?  ? ?   ? ?Time: 3762-8315 ?OT Time Calculation (min): 22 min ? ?Charges: OT General Charges ?$OT Visit: 1 Visit ?OT Treatments ?$Self Care/Home Management : 8-22 mins ? ?Jolaine Artist, OT ?Acute Rehabilitation Services ?Pager [phone] ?Office [phone] ? ? ?[place] ?11/30/2021, 1:30 PM ?

## 2021-12-01 DIAGNOSIS — I480 Paroxysmal atrial fibrillation: Secondary | ICD-10-CM | POA: Diagnosis not present

## 2021-12-01 DIAGNOSIS — N179 Acute kidney failure, unspecified: Secondary | ICD-10-CM | POA: Diagnosis not present

## 2021-12-01 DIAGNOSIS — E1169 Type 2 diabetes mellitus with other specified complication: Secondary | ICD-10-CM | POA: Diagnosis not present

## 2021-12-01 DIAGNOSIS — I5023 Acute on chronic systolic (congestive) heart failure: Secondary | ICD-10-CM | POA: Diagnosis not present

## 2021-12-01 LAB — BASIC METABOLIC PANEL
Anion gap: 8 (ref 5–15)
BUN: 45 mg/dL — ABNORMAL HIGH (ref 8–23)
CO2: 27 mmol/L (ref 22–32)
Calcium: 9.1 mg/dL (ref 8.9–10.3)
Chloride: 101 mmol/L (ref 98–111)
Creatinine, Ser: 3.26 mg/dL — ABNORMAL HIGH (ref 0.44–1.00)
GFR, Estimated: 15 mL/min — ABNORMAL LOW (ref >60)
Glucose, Bld: 135 mg/dL — ABNORMAL HIGH (ref 70–99)
Potassium: 4.8 mmol/L (ref 3.5–5.1)
Sodium: 136 mmol/L (ref 135–145)

## 2021-12-01 NOTE — Plan of Care (Signed)
?  Problem: Cardiac: ?Goal: Ability to achieve and maintain adequate cardiopulmonary perfusion will improve ?Outcome: Progressing ?  ?Problem: Clinical Measurements: ?Goal: Diagnostic test results will improve ?Outcome: Progressing ?Goal: Respiratory complications will improve ?Outcome: Progressing ?Goal: Cardiovascular complication will be avoided ?Outcome: Progressing ?  ?Problem: Pain Managment: ?Goal: General experience of comfort will improve ?Outcome: Progressing ?  ?Problem: Safety: ?Goal: Ability to remain free from injury will improve ?Outcome: Progressing ?  ?

## 2021-12-01 NOTE — Progress Notes (Signed)
?Progress Note ? ? ?PatientJohnathan Gallagher WJX:[id number] DOB: 1955-12-19 DOA: 11/21/2021     9 ?DOS: the patient was seen and examined on 12/01/2021 ?  ?Brief hospital course: ?[name] was admitted to the hospital with the working diagnosis of decompensated heart failure. ? 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 Reported edema of arms and legs for 2 weeks, and dyspnea for 3 days into her hospitalization. Positive weight gain.  ?Frequent hospitalizations for heart failure, recent one on 08/2021, placed on daily torsemide and 3 time per week metolazone.  ? ?On her initial physical examination her blood pressure was 130/85, HR 142, RR 28, 02 saturation 99%, heart S1 and S2 present, irregularly irregular, tachycardic, lungs with no wheezing, but positive rales, abdomen soft and no lower extremity edema.  ? ?Na 139, K 3,6, CL 105, bicarbonate 24, glucose 147, BUN 16 and CR 2.0 ?BNP 534 ?Wbc 6.5 hgb 10,3 hct 33.0 plt 225  ?Sars covid 19 negative ? ?Chest radiograph with cardiomegaly, bilateral hilar vascular congestion, no infiltrates.  ? ?EKG 105 bpm, right axis deviation, Qtc 565, atrial fibrillation rhythm, with no significant ST segment changes, negative T wave lead I and AVL.  ? ?Patient placed on IV furosemide for diuresis.  ?04/06 biventricular failure with elevated pulmonary pressures and wedge pressure. ?Placed on inotropic support with milrinone and furosemide drip.  ?04/07 TEE cardioversion converting to sinus rhythm.  ? ?04/10 patient with acute altered mental status, slurring speech and stuttering.  ?CODE stroke was called and MRI was ordered.  ?CVA ruled out, possible medication induced neurologic side effects (amiodarone or gabapentin).  ? ?04/12 worsening renal failure, continue with stuttering and tremors.  ?04/13 neurologic deficits are improving.   ? ?Assessment and Plan: ?* Acute on chronic systolic CHF (congestive heart failure) ([place]) ?Echocardiogram with reduced LV systolic function 35 to 21% with global hypokinesis, interventricular septum is flattened and systole and diastole. Reduced RV systolic function, moderate. RVSP 46.9. sever dilatation of right and left atriums. Severe tricuspid valve regurgitation.  ? ?Cardiac catheterization with PA 67/37, mean 45, PCW 30, with cardiac output 6,2 and index 2,5 ?Consistent with biventricular failure, with pulmonary hypertension ?Pulmonary hypertension.  ? ?Furosemide drip was discontinued.  ?Had metolazone 04/07.  ?Patient now off milrinone infusion.  ?Systolic blood pressure 308 to 135 mmHg. ? ?Patient with recurrent edema at her lower extremities, to consider resuming low dose diuretic therapy.  ? ? ?AF (paroxysmal atrial fibrillation) ([place]) ?Off amiodarone  ?Continue anticoagulation with apixaban ? ?Continue on sinus rhythm sp direct current cardioversion.  ?Continue telemetry monitoring.  ? ?Acute kidney injury superimposed on chronic kidney disease ([place]) ?CKD stage 4, Hypokalemia. ? ?Continue worsening renal function with serum cr at 3,26 with K at 4,8 and serum bicarbonate at 27. ?Patient may benefit from resuming diuresis.  ? ?Follow up renal function in am, avoid hypotension and nephrotoxic medications.  ? ?Type 2 diabetes mellitus with hyperlipidemia ([place]) ?Fasting glucose is 135 mg/dl.  ?No insulin therapy. ?Continue with statin therapy.   ? ?Acute mastoiditis, left ?Completed 7 days of Augmentin.  ? ?Class 3 obesity ([place]) ?Calculated BMI is 51,9  ? ?Altered mental status ?CVA has been ruled out.  ?Stuttering and tremors have improved, possible medication related to amiodarone or gabapentin. ? ?Low GFR likely making recovery more slow.  ?Continue neuro checks per unit protocol.  ? ?  For depression continue with buspirone. ?Decrease dose of duloxetine. ?Discontinue diphenhydramine and  hydrocodone. ?Discontinue loratadine, hydrocodone and morphine.  ? ?Neurologic deficits are improving slowly but not back to baseline.  ? ? ? ? ?  ? ?Subjective: patient with pain in her legs, continue stuttering but no tremors, no dyspnea  ? ?Physical Exam: ?Vitals:  ? 12/01/21 0400 12/01/21 0426 12/01/21 0824 12/01/21 1054  ?BP:  134/64 135/74 112/67  ?Pulse:  81 85 80  ?Resp:  '18 19 17  '$ ?Temp: 98 ?F (36.7 ?C) 98 ?F (36.7 ?C) 97.6 ?F (36.4 ?C) 97.8 ?F (36.6 ?C)  ?TempSrc:  Oral Oral Oral  ?SpO2:  98% 98% 95%  ?Weight:  (!) 136.4 kg    ?Height:      ? ?Neurology stuttering has improved, but not resolved, no tremors, follows commands, no focal weakness ?ENT with no pallor ?Cardiovascular with S1 and S2 present and rhythmic with no gallops or murmurs ?Respiratory with no wheezing ?Abdomen protuberant ?Lower extremities with non pitting edema bilaterally.  ?Data Reviewed: ? ? ? ?Family Communication: no family at the bedside  ? ?Disposition: ?Status is: Inpatient ?Remains inpatient appropriate because: heart failure  ? Planned Discharge Destination: Home ? ?Author: ?Tawni Millers, MD ?12/01/2021 2:00 PM ? ?For on call review [email or web address]  ?

## 2021-12-01 NOTE — Progress Notes (Signed)
Orthopedic Tech Progress Note ?Patient Details:  ?[name] ?October 22, 1955 ?[id number] ? ?BLT unna boots applied, to be changed on Monday 4/17. ? ?Ortho Devices ?Type of Ortho Device: Unna boot ?Ortho Device/Splint Location: BLE ?Ortho Device/Splint Interventions: Ordered, Application, Adjustment ?  ?Post Interventions ?Patient Tolerated: Well ?Instructions Provided: Care of device ? ?Mung Rinker Jeri Modena ?12/01/2021, 3:52 PM ? ?

## 2021-12-01 NOTE — Progress Notes (Signed)
?  Mobility Specialist Criteria Algorithm Info. ? ? 12/01/21 1450  ?Mobility  ?Activity Ambulated with assistance in hallway  ?Range of Motion/Exercises Active;All extremities  ?Level of Assistance Standby assist, set-up cues, supervision of patient - no hands on  ?Assistive Device Front wheel walker  ?Distance Ambulated (ft) 220 ft  ?Activity Response Tolerated well  ? ?Patient received in chair eager to participate in mobility. Upon standing from chair pt with LOBx1, but no complaints of dizziness. Agreed to use RW. Ambulated in hallway supervision level with slow steady gait requiring seated rest x1 second to fatigue. Returned to room without complaint or incident. Was left in recliner chair with all needs met, call bell in reach.  ? ?12/01/2021 ?3:50 PM ? ?[place] Daesean Lazarz, CMS, BS EXP ?Acute Rehabilitation Services  ?HCSPZ:[phone] ?Office: [phone] ? ?

## 2021-12-01 NOTE — Progress Notes (Addendum)
? ? Advanced Heart Failure Rounding Note ? ?PCP-Cardiologist: Buford Dresser, MD  ? ?Subjective:   ? ?04/03: Admitted with A fib RVR and A/C HFrEF.  Started on amio drip. Diuresing with IV lasix.   ?04/06: RHC with severely elevated biventricular pressures. Severe RV failure. Started milrinone 0.125 + lasix gtt  ?04/07: S/p DCCV, Afib >>>NSR ?04/10: AMS . Code stroke called. MRI/Head CT negative. ? Related to amio. Diuretics stopped.  ? ?Creatinine trending up 2.45>2.6 (contrast) >2.8 >2.9>3.26 ? ?Continues to stutter, speech intermittently clear.   ? ?No dyspnea but reports legs feeling swollen. ? ?Maintaining SR. ? ? ?Objective:   ?Weight Range: ?(!) 136.4 kg ?Body mass index is 47.08 kg/m?.  ? ?Vital Signs:   ?Temp:  [97.6 ?F (36.4 ?C)-98.2 ?F (36.8 ?C)] 97.6 ?F (36.4 ?C) (04/13 0947) ?Pulse Rate:  [81-85] 85 (04/13 0824) ?Resp:  [18-20] 19 (04/13 0824) ?BP: (129-141)/(64-76) 135/74 (04/13 0962) ?SpO2:  [96 %-98 %] 98 % (04/13 0824) ?Weight:  [136.4 kg] 136.4 kg (04/13 0426) ?Last BM Date : 11/30/21 ? ?Weight change: ?Filed Weights  ? 11/29/21 0414 11/30/21 0550 12/01/21 0426  ?Weight: 133.2 kg 134.2 kg (!) 136.4 kg  ? ? ?Intake/Output:  ? ?Intake/Output Summary (Last 24 hours) at 12/01/2021 0909 ?Last data filed at 12/01/2021 0835 ?Gross per 24 hour  ?Intake 960 ml  ?Output 975 ml  ?Net -15 ml  ?  ? ? ?Physical Exam  ?General:  No distress. Sitting up in chair. ?HEENT: normal ?Neck: supple. JVP difficult d/t body habitus. Carotids 2+ bilat; no bruits.  ?Cor: PMI nondisplaced. Regular rate & rhythm. No rubs, gallops, 3/6 TR murmur ?Lungs: clear ?Abdomen: obese, soft, nontender, nondistended. Good bowel sounds. ?Extremities: no cyanosis, clubbing, rash, 2+ edema ?Neuro: alert & orientedx3, cranial nerves grossly intact. moves all 4 extremities w/o difficulty. Affect pleasant ? ? ? ?Telemetry  ? ?SR 70s-80s (personally reviewed) ? ? ?Labs  ?  ?CBC ?Recent Labs  ?  11/29/21 ?0322 11/30/21 ?0346  ?WBC 6.8 6.7   ?HGB 8.5* 8.7*  ?HCT 27.6* 28.0*  ?MCV 92.3 91.2  ?PLT 219 237  ? ?Basic Metabolic Panel ?Recent Labs  ?  11/29/21 ?0322 11/30/21 ?0346 12/01/21 ?8366  ?NA 138 137 136  ?K 4.6 4.8 4.8  ?CL 101 102 101  ?CO2 '29 29 27  '$ ?GLUCOSE 153* 154* 135*  ?BUN 38* 38* 45*  ?CREATININE 2.78* 2.89* 3.26*  ?CALCIUM 8.9 9.1 9.1  ?MG 2.1  --   --   ? ?Liver Function Tests ?No results for input(s): AST, ALT, ALKPHOS, BILITOT, PROT, ALBUMIN in the last 72 hours. ? ?No results for input(s): LIPASE, AMYLASE in the last 72 hours. ? ?Cardiac Enzymes ?No results for input(s): CKTOTAL, CKMB, CKMBINDEX, TROPONINI in the last 72 hours. ? ? ?BNP: ?BNP (last 3 results) ?Recent Labs  ?  08/31/21 ?0114 09/23/21 ?1554 11/21/21 ?1122  ?BNP 132.7* 31.1 534.8*  ? ? ?ProBNP (last 3 results) ?No results for input(s): PROBNP in the last 8760 hours. ? ? ?D-Dimer ?No results for input(s): DDIMER in the last 72 hours. ? ?Hemoglobin A1C ?No results for input(s): HGBA1C in the last 72 hours. ? ?Fasting Lipid Panel ?No results for input(s): CHOL, HDL, LDLCALC, TRIG, CHOLHDL, LDLDIRECT in the last 72 hours. ?Thyroid Function Tests ?No results for input(s): TSH, T4TOTAL, T3FREE, THYROIDAB in the last 72 hours. ? ?Invalid input(s): FREET3 ? ? ?Other results: ? ? ?Imaging  ? ? ?No results found. ? ? ?Medications:   ? ? ?  Scheduled Medications: ? amiodarone  200 mg Oral Daily  ? apixaban  5 mg Oral BID  ? busPIRone  5 mg Oral BID  ? colchicine  0.3 mg Oral Daily  ? DULoxetine  30 mg Oral Daily  ? metoprolol succinate  25 mg Oral Daily  ? multivitamin with minerals  1 tablet Oral Daily  ? pantoprazole  40 mg Oral Daily  ? rosuvastatin  10 mg Oral QPM  ? sodium chloride flush  3 mL Intravenous Q12H  ? sodium chloride flush  3 mL Intravenous Q12H  ? sodium chloride flush  3 mL Intravenous Q12H  ? ? ?Infusions: ? sodium chloride    ? sodium chloride    ? ? ?PRN Medications: ?sodium chloride, sodium chloride, acetaminophen **OR** acetaminophen, albuterol, sodium chloride  flush, sodium chloride flush ? ? ? ?Patient Profile  ?66 y.o. female with history of morbid obesity, chronic diastolic and systolic HF with mildly reduced LVEF (45-50%), severe MR/TR, atrial fibrillation on Eliquis, HTN, and DMII.  ? ?Admitted with A Fib RVR and A/C HFrEF.  ? ? ? ?Assessment/Plan  ?1. A fib RVR ?- She has failed Tikosyn ?- Maze in 5/22 and DCCV in 6/22 & 04/19/21, last of which she had ERAF. ?- Saw Dr Quentin Ore in February, discussed AV nodal ablation + CRT if recurrent atrial arrhythmias ?- S/p successful DCCV on 04/07.  ?- Amio gtt switched to po amio 400 mg BID on 04/08.  ?- Initially stopped amio d/t concern contributing to neuro issus. Amio restarted 04/12. Do not feel amio contributing stuttering. ?- if she goes in A fib again ? Rate control. Maintaining SR.  ?- Continue Eliquis 5 mg BID. ?  ?2. A/C HFrEF,NICM ?- RHC (5/22): elevated R>L filling pressures with PAPi low, suggestive of significant component of RV dysfunction, prominent v-waves in PCWP and RA tracings suggestive of MR/TR, CO low ?- RHC (6/22) with low filling pressures with moderately reduced CO. ?- RHC 07/2021 normal filling pressures and moderately reduced cardiac output.  ?- Echo (7/22) in setting of AFL with RVR, showed EF 30-35%, moderate LVH, moderate RV dysfunction, PASP 48, s/p MV repair with mild MR and mean gradient 10 mmHg with HR around 130, s/p TV repair with mild-moderate TR, dilated IVC.  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  ?- Echo 11/22/21: EF 30-35% RV moderately reduced, RVSP 47 mmHg, severe BAE, mild MS, severe TR despite TV repair ?- RHC 11/24/21: RA 29, PA 67/37 (45), PCW 30 ( v waves to 45), Fick CO/CI 6.2/2.5, PA sat 50%, 55%, PAPi 1.0. Severely elevated biventricular pressures, moderate pulmonary hypertension with marked V waves in PCWP tracing and severe RV failure. ?- Suspect AF contributing to decompensation. S/p successful DCCV on 04/07 as above. ?- Milrinone stopped 4/11.  Volume status stable, maybe  slightly volume up. Hold diuretics with AKI.  ?- GDMT limited by AKI on CKD, eventually consider spiro and SGLT2i ?- Continue toprol xl 25 mg daily ?- Reapply UNNA boots ? ?3. PVCs ?- Improved off milrinone ? ?4. Valvular Heart Disease ?-S/p MV and TV repair in 5/22 ?-Echo 04/23: EF 30-35%, RV moderately reduced, severe TR ?-RHC 4/6 .  Severely elevated biventricular pressures, moderate pulmonary hypertension and severe RV failure. PAPi 1.0. ?-Not sure if there are any options for repair. Need to allow for renal recovery at this point. ? ?5. CAD/Chest pain ?- trivial CAD on heart cath 2019 ?- No chest pain.  ?- hstrop negative. Doubt ischemic ?  ?6. CKD  Stage IV ?- baseline Scr 2-2.5 ?- Scr 2.09>2.46.2.63>2.54>2.45>2.60>2.8 >>2.9>>3.26 ?- Daily BMET ?  ?7. DMII ?- per primary  ?- consider SGLT2i as renal function stabilizes ?  ?8. Morbid Obesity  ?- needs weight loss ?- May be candidate for GLP1RA ? ?9. Anemia ?- Baseline 10-12, Hgb 8.7 yesterday ?- No bleeding issues.  ?- Iron stores low. Consider feraheme once treatment for mastoiditis complete. ? ?10. Confusion/slurred speech/tremor. Stuttering ?- 4/10 Code stroke activated.Neurology consulted.  ?-  MRI - no acute findings. CT unrevealing.  ?- Has not missed any doses of Eliquis since admission. ?- ? Amiodarone contributing to tremor. Amio stopped but added back 04/12 to maintain sinus rhythm. Doubt contributing to stutter. Uncertain etiology. ?- Consider speech therapy? ?- Medications including narcotics, benadryl and duloxetine adjusted by primary team ? ?Disposition: ?PT/OT suggested [facility] PT/OT. Would consider CIR consult. ? ? ?Length of Stay: 9 ? ?FINCH, LINDSAY N, PA-C  ?12/01/2021, 9:09 AM ? ?Advanced Heart Failure Team ?Pager [phone] (M-F; 7a - 5p)  ?Please contact [facility] for night-coverage after hours (5p -7a ) and weekends on [email or web address] ? ?Patient seen and examined with the above-signed Advanced Practice Provider and/or Housestaff. I personally  reviewed laboratory data, imaging studies and relevant notes. I independently examined the patient and formulated the important aspects of the plan. I have edited the note to reflect any of my changes or

## 2021-12-01 NOTE — Progress Notes (Signed)
?  Inpatient Rehabilitation Admissions Coordinator  ? ?I met with patient at bedside for rehab assessment. Her insurance will not approve [facility] admit at her current functional level nor does she require that level of rehab. I discussed SNF which she refuses. She requests home health. I have alerted acute tem and TOC. We will sign off at this time. ? ?Danne Baxter, RN, MSN ?Rehab Admissions Coordinator ?[phone] ?12/01/2021 1:09 PM ? ?

## 2021-12-02 DIAGNOSIS — I480 Paroxysmal atrial fibrillation: Secondary | ICD-10-CM | POA: Diagnosis not present

## 2021-12-02 DIAGNOSIS — N179 Acute kidney failure, unspecified: Secondary | ICD-10-CM | POA: Diagnosis not present

## 2021-12-02 DIAGNOSIS — E1169 Type 2 diabetes mellitus with other specified complication: Secondary | ICD-10-CM | POA: Diagnosis not present

## 2021-12-02 DIAGNOSIS — I5023 Acute on chronic systolic (congestive) heart failure: Secondary | ICD-10-CM | POA: Diagnosis not present

## 2021-12-02 LAB — BASIC METABOLIC PANEL
Anion gap: 6 (ref 5–15)
BUN: 51 mg/dL — ABNORMAL HIGH (ref 8–23)
CO2: 28 mmol/L (ref 22–32)
Calcium: 8.9 mg/dL (ref 8.9–10.3)
Chloride: 101 mmol/L (ref 98–111)
Creatinine, Ser: 3.29 mg/dL — ABNORMAL HIGH (ref 0.44–1.00)
GFR, Estimated: 15 mL/min — ABNORMAL LOW (ref >60)
Glucose, Bld: 138 mg/dL — ABNORMAL HIGH (ref 70–99)
Potassium: 4 mmol/L (ref 3.5–5.1)
Sodium: 135 mmol/L (ref 135–145)

## 2021-12-02 MED ORDER — FUROSEMIDE 10 MG/ML IJ SOLN
80.0000 mg | Freq: Once | INTRAMUSCULAR | Status: AC
  Administered 2021-12-02: 80 mg via INTRAVENOUS
  Filled 2021-12-02: qty 8

## 2021-12-02 MED ORDER — TORSEMIDE 20 MG PO TABS
40.0000 mg | ORAL_TABLET | Freq: Every day | ORAL | Status: DC
  Administered 2021-12-03: 40 mg via ORAL
  Filled 2021-12-02: qty 2

## 2021-12-02 NOTE — Care Management Important Message (Signed)
Important Message ? ?Patient Details  ?Name: [name] ?MRN: [id number] ?Date of Birth: 02-17-1956 ? ? ?Medicare Important Message Given:  Yes ? ? ? ? ?Shelda Altes ?12/02/2021, 11:08 AM ?

## 2021-12-02 NOTE — Progress Notes (Signed)
Mobility Specialist Progress Note: ? ? 12/02/21 1000  ?Mobility  ?Activity Ambulated with assistance in hallway  ?Level of Assistance Standby assist, set-up cues, supervision of patient - no hands on  ?Assistive Device Front wheel walker  ?Distance Ambulated (ft) 250 ft  ?Activity Response Tolerated well  ?$Mobility charge 1 Mobility  ? ?Pt agreeable to mobility session. Required x2 seated rest breaks d/t fatigue. Pt in better spirits today d/t incr clearness of speech. Pt left in chair with all needs met, chair alarm on.  ? ?Nelta Numbers ?Acute Rehab ?Phone: [phone] ?Office Phone: [phone] ? ?

## 2021-12-02 NOTE — Progress Notes (Signed)
Mobility Specialist Progress Note: ? ? 12/02/21 1630  ?Mobility  ?Activity  ?(chair level exercises)  ?Range of Motion/Exercises Active;All extremities  ?Level of Assistance Independent  ?Assistive Device None  ?Activity Response Tolerated well  ?$Mobility charge 1 Mobility  ? ?Pt seen for second mobility session today. Declined ambulation at this time d/t receiving IV lasix and urine urgency. Agreeable to chair-level exercises, pt tolerated well. Pt left in chair with all needs met.  ? ?Nelta Numbers ?Acute Rehab ?Phone: [phone] ?Office Phone: [phone] ? ?

## 2021-12-02 NOTE — Progress Notes (Addendum)
? ? Advanced Heart Failure Rounding Note ? ?PCP-Cardiologist: Buford Dresser, MD  ? ?Subjective:   ? ?04/03: Admitted with A fib RVR and A/C HFrEF.  Started on amio drip. Diuresing with IV lasix.   ?04/06: RHC with severely elevated biventricular pressures. Severe RV failure. Started milrinone 0.125 + lasix gtt  ?04/07: S/p DCCV, Afib >>>NSR ?04/10: AMS . Code stroke called. MRI/Head CT negative. ? Related to amio. Diuretics stopped.  ? ?Creatinine trending up 2.45>2.6 (contrast) >2.8 >2.9>3.26>>3.29 ? ?Stuttering seems to be less frequent. Denies SOB.  ? ?Objective:   ?Weight Range: ?(!) 137.8 kg ?Body mass index is 47.57 kg/m?.  ? ?Vital Signs:   ?Temp:  [97.6 ?F (36.4 ?C)-98.4 ?F (36.9 ?C)] 98.4 ?F (36.9 ?C) (04/14 0348) ?Pulse Rate:  [75-85] 79 (04/14 0348) ?Resp:  [17-20] 20 (04/14 0348) ?BP: (112-139)/(56-92) 118/56 (04/14 0348) ?SpO2:  [95 %-98 %] 98 % (04/14 0348) ?Weight:  [137.8 kg] 137.8 kg (04/14 0348) ?Last BM Date : 11/30/21 ? ?Weight change: ?Filed Weights  ? 11/30/21 0550 12/01/21 0426 12/02/21 0348  ?Weight: 134.2 kg (!) 136.4 kg (!) 137.8 kg  ? ? ?Intake/Output:  ? ?Intake/Output Summary (Last 24 hours) at 12/02/2021 0820 ?Last data filed at 12/02/2021 0400 ?Gross per 24 hour  ?Intake 663 ml  ?Output 1375 ml  ?Net -712 ml  ?  ? ? ?Physical Exam  ?General:  . No resp difficulty ?HEENT: normal ?Neck: supple. JVP 6-7 . Carotids 2+ bilat; no bruits. No lymphadenopathy or thryomegaly appreciated. ?Cor: PMI nondisplaced. Regular rate & rhythm. No rubs, gallops or murmurs. ?Lungs: clear ?Abdomen: soft, nontender, nondistended. No hepatosplenomegaly. No bruits or masses. Good bowel sounds. ?Extremities: no cyanosis, clubbing, rash, R and LLE unna boots. edema ?Neuro: alert & orientedx3, cranial nerves grossly intact. moves all 4 extremities w/o difficulty. Affect pleasant ? ? ? ?Telemetry  ? ?SR 70s-80s (personally reviewed) ? ? ?Labs  ?  ?CBC ?Recent Labs  ?  11/30/21 ?0346  ?WBC 6.7  ?HGB 8.7*   ?HCT 28.0*  ?MCV 91.2  ?PLT 237  ? ?Basic Metabolic Panel ?Recent Labs  ?  12/01/21 ?5009 12/02/21 ?0321  ?NA 136 135  ?K 4.8 4.0  ?CL 101 101  ?CO2 27 28  ?GLUCOSE 135* 138*  ?BUN 45* 51*  ?CREATININE 3.26* 3.29*  ?CALCIUM 9.1 8.9  ? ?Liver Function Tests ?No results for input(s): AST, ALT, ALKPHOS, BILITOT, PROT, ALBUMIN in the last 72 hours. ? ?No results for input(s): LIPASE, AMYLASE in the last 72 hours. ? ?Cardiac Enzymes ?No results for input(s): CKTOTAL, CKMB, CKMBINDEX, TROPONINI in the last 72 hours. ? ? ?BNP: ?BNP (last 3 results) ?Recent Labs  ?  08/31/21 ?0114 09/23/21 ?1554 11/21/21 ?1122  ?BNP 132.7* 31.1 534.8*  ? ? ?ProBNP (last 3 results) ?No results for input(s): PROBNP in the last 8760 hours. ? ? ?D-Dimer ?No results for input(s): DDIMER in the last 72 hours. ? ?Hemoglobin A1C ?No results for input(s): HGBA1C in the last 72 hours. ? ?Fasting Lipid Panel ?No results for input(s): CHOL, HDL, LDLCALC, TRIG, CHOLHDL, LDLDIRECT in the last 72 hours. ?Thyroid Function Tests ?No results for input(s): TSH, T4TOTAL, T3FREE, THYROIDAB in the last 72 hours. ? ?Invalid input(s): FREET3 ? ? ?Other results: ? ? ?Imaging  ? ? ?No results found. ? ? ?Medications:   ? ? ?Scheduled Medications: ? amiodarone  200 mg Oral Daily  ? apixaban  5 mg Oral BID  ? busPIRone  5 mg Oral BID  ?  colchicine  0.3 mg Oral Daily  ? DULoxetine  30 mg Oral Daily  ? metoprolol succinate  25 mg Oral Daily  ? multivitamin with minerals  1 tablet Oral Daily  ? pantoprazole  40 mg Oral Daily  ? rosuvastatin  10 mg Oral QPM  ? sodium chloride flush  3 mL Intravenous Q12H  ? sodium chloride flush  3 mL Intravenous Q12H  ? sodium chloride flush  3 mL Intravenous Q12H  ? ? ?Infusions: ? sodium chloride    ? sodium chloride    ? ? ?PRN Medications: ?sodium chloride, sodium chloride, acetaminophen **OR** acetaminophen, albuterol, sodium chloride flush, sodium chloride flush ? ? ? ?Patient Profile  ?66 y.o. female with history of morbid  obesity, chronic diastolic and systolic HF with mildly reduced LVEF (45-50%), severe MR/TR, atrial fibrillation on Eliquis, HTN, and DMII.  ? ?Admitted with A Fib RVR and A/C HFrEF.  ? ? ? ?Assessment/Plan  ?1. A fib RVR ?- She has failed Tikosyn ?- Maze in 5/22 and DCCV in 6/22 & 04/19/21, last of which she had ERAF. ?- Saw Dr Quentin Ore in February, discussed AV nodal ablation + CRT if recurrent atrial arrhythmias ?- S/p successful DCCV on 04/07.  ?- Amio gtt switched to po amio 400 mg BID on 04/08.  ?- Initially stopped amio d/t concern contributing to neuro issus. Amio restarted 04/12. Do not feel amio contributing stuttering. ?- if she goes in A fib again ? Rate control. Maintaining SR.  ?- Continue Eliquis 5 mg BID. ?  ?2. A/C HFrEF,NICM ?- RHC (5/22): elevated R>L filling pressures with PAPi low, suggestive of significant component of RV dysfunction, prominent v-waves in PCWP and RA tracings suggestive of MR/TR, CO low ?- RHC (6/22) with low filling pressures with moderately reduced CO. ?- RHC 07/2021 normal filling pressures and moderately reduced cardiac output.  ?- Echo (7/22) in setting of AFL with RVR, showed EF 30-35%, moderate LVH, moderate RV dysfunction, PASP 48, s/p MV repair with mild MR and mean gradient 10 mmHg with HR around 130, s/p TV repair with mild-moderate TR, dilated IVC.  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  ?- Echo 11/22/21: EF 30-35% RV moderately reduced, RVSP 47 mmHg, severe BAE, mild MS, severe TR despite TV repair ?- RHC 11/24/21: RA 29, PA 67/37 (45), PCW 30 ( v waves to 45), Fick CO/CI 6.2/2.5, PA sat 50%, 55%, PAPi 1.0. Severely elevated biventricular pressures, moderate pulmonary hypertension with marked V waves in PCWP tracing and severe RV failure. ?- Suspect AF contributing to decompensation. S/p successful DCCV on 04/07 as above. ?- Milrinone stopped 4/11.   ?- Volume status look ok. Hold diuretics with AKI.  ?- GDMT limited by AKI on CKD, eventually consider spiro and  SGLT2i ?- Continue toprol xl 25 mg daily ?- Reapply UNNA boots ? ?3. PVCs ?- Improved off milrinone ?< 10 per hour.  ? ?4. Valvular Heart Disease ?-S/p MV and TV repair in 5/22 ?-Echo 04/23: EF 30-35%, RV moderately reduced, severe TR ?-RHC 4/6 .  Severely elevated biventricular pressures, moderate pulmonary hypertension and severe RV failure. PAPi 1.0. ?-Not sure if there are any options for repair. Need to allow for renal recovery at this point. ? ?5. CAD/Chest pain ?- trivial CAD on heart cath 2019 ?- No chest pain.  ?- hstrop negative. Doubt ischemic ?  ?6. CKD Stage IV ?- baseline Scr 2-2.5 ?- Scr 2.09>2.46.2.63>2.54>2.45>2.60>2.8 >>2.9>>3.26>>3.29 ?- Hold diuretics.  ?  ?7. DMII ?- per primary  ?-  consider SGLT2i as renal function stabilizes ?  ?8. Morbid Obesity  ?- needs weight loss ?- May be candidate for GLP1RA ? ?9. Anemia ?- Baseline 10-12, Hgb 8.7 4/12 ?- No bleeding issues.  ?- Iron stores low. Consider feraheme once treatment for mastoiditis complete. ? ?10. Confusion/slurred speech/tremor. Stuttering ?- 4/10 Code stroke activated.Neurology consulted.  ?-  MRI - no acute findings. CT unrevealing.  ?- Has not missed any doses of Eliquis since admission. ?- ? Amiodarone contributing to tremor. Amio stopped but added back 04/12 to maintain sinus rhythm. Doubt contributing to stutter. Uncertain etiology. ?- Consider speech therapy? Seems to resolving.  ?- Medications including narcotics, benadryl and duloxetine adjusted by primary team ? ?Disposition: ?PT/OT suggested [facility] PT/OT.  ? ? ?CIR is not an option at her functional level. ? SNF versus HH.  ?  ?Length of Stay: 10 ? ?Darrick Grinder, NP  ?12/02/2021, 8:20 AM ? ?Advanced Heart Failure Team ?Pager [phone] (M-F; 7a - 5p)  ?Please contact [facility] for night-coverage after hours (5p -7a ) and weekends on [email or web address] ? ?Patient seen and examined with the above-signed Advanced Practice Provider and/or Housestaff. I personally reviewed laboratory data,  imaging studies and relevant notes. I independently examined the patient and formulated the important aspects of the plan. I have edited the note to reflect any of my changes or salient points. I have personally discussed t

## 2021-12-02 NOTE — Progress Notes (Signed)
?Progress Note ? ? ?PatientNyelah Gallagher AJO:[id number] DOB: 10-30-1955 DOA: 11/21/2021     10 ?DOS: the patient was seen and examined on 12/02/2021 ?  ?Brief hospital course: ?[name] was admitted to the hospital with the working diagnosis of decompensated heart failure. ? 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 Reported edema of arms and legs for 2 weeks, and dyspnea for 3 days into her hospitalization. Positive weight gain.  ?Frequent hospitalizations for heart failure, recent one on 08/2021, placed on daily torsemide and 3 time per week metolazone.  ? ?On her initial physical examination her blood pressure was 130/85, HR 142, RR 28, 02 saturation 99%, heart S1 and S2 present, irregularly irregular, tachycardic, lungs with no wheezing, but positive rales, abdomen soft and no lower extremity edema.  ? ?Na 139, K 3,6, CL 105, bicarbonate 24, glucose 147, BUN 16 and CR 2.0 ?BNP 534 ?Wbc 6.5 hgb 10,3 hct 33.0 plt 225  ?Sars covid 19 negative ? ?Chest radiograph with cardiomegaly, bilateral hilar vascular congestion, no infiltrates.  ? ?EKG 105 bpm, right axis deviation, Qtc 565, atrial fibrillation rhythm, with no significant ST segment changes, negative T wave lead I and AVL.  ? ?Patient placed on IV furosemide for diuresis.  ?04/06 biventricular failure with elevated pulmonary pressures and wedge pressure. ?Placed on inotropic support with milrinone and furosemide drip.  ?04/07 TEE cardioversion converting to sinus rhythm.  ? ?04/10 patient with acute altered mental status, slurring speech and stuttering.  ?CODE stroke was called and MRI was ordered.  ?CVA ruled out, possible medication induced neurologic side effects (amiodarone or gabapentin).  ? ?04/12 worsening renal failure, continue with stuttering and tremors.  ?04/13 neurologic deficits are improving.   ?04/14 renal function more stable but not back to baseline.  ? ?Assessment and Plan: ?* Acute on chronic systolic CHF (congestive heart failure) ([place]) ?Echocardiogram with reduced LV systolic function 35 to 94% with global hypokinesis, interventricular septum is flattened and systole and diastole. Reduced RV systolic function, moderate. RVSP 46.9. sever dilatation of right and left atriums. Severe tricuspid valve regurgitation.  ? ?Cardiac catheterization with PA 67/37, mean 45, PCW 30, with cardiac output 6,2 and index 2,5 ?Consistent with biventricular failure, with pulmonary hypertension ?Pulmonary hypertension.  ? ?Furosemide drip was discontinued.  ?Had metolazone 04/07.  ?Patient now off milrinone infusion.  ? ?Patient hemodynamically stable with blood pressure systolic 709 mmHg range, HR 79 range. ?Her renal function more sable today.  ?Pending final recommendations from heart failure team. ?Plan to discharge home with home health services.  ? ?AF (paroxysmal atrial fibrillation) ([place]) ?Off amiodarone  ?Continue anticoagulation with apixaban ? ?Continue on sinus rhythm sp direct current cardioversion.  ?Continue telemetry monitoring.  ? ?Acute kidney injury superimposed on chronic kidney disease ([place]) ?CKD stage 4, Hypokalemia. ? ?Her renal function is more stable today with serum cr at 3.29 with K at 4,0 and serum bicarbonate at 28. ?Plan to continue close follow up on renal function and electrolytes, avoid hypotension and nephrotoxic medications.  ? ?Type 2 diabetes mellitus with hyperlipidemia ([place]) ?Fasting glucose is 138 mg/dl.  ?No insulin therapy. ?Continue with statin therapy.   ? ?Acute mastoiditis, left ?Completed 7 days of Augmentin.  ? ?Class 3 obesity ([place]) ?Calculated BMI is 51,9  ? ?Altered mental status ?CVA has been ruled out.  ?Stuttering and tremors have improved,  possible medication related to amiodarone or gabapentin. ? ?Low GFR likely making recovery more slow.  ?Continue neuro checks  per unit protocol.  ? ?For depression continue with buspirone. ?Continue lower dose of duloxetine. ?Discontinue diphenhydramine and hydrocodone. ?Discontinue loratadine, hydrocodone and morphine.  ? ?Continue to improve neurologic deficits. ?At this point will continue pain control with acetaminophen, patient agrees. ?Avoid gabapentin  ? ? ? ? ?  ? ?Subjective: Patient out of bed to chair, speech is improving, she has moderate leg pain, improved with acetaminophen  ? ?Physical Exam: ?Vitals:  ? 12/01/21 1751 12/01/21 1959 12/02/21 0348 12/02/21 1109  ?BP: (!) 139/92 132/79 (!) 118/56 117/74  ?Pulse: 78 75 79 79  ?Resp: '19 19 20 20  '$ ?Temp: 98.2 ?F (36.8 ?C) 97.6 ?F (36.4 ?C) 98.4 ?F (36.9 ?C) 98 ?F (36.7 ?C)  ?TempSrc: Oral Oral Oral Axillary  ?SpO2: 95% 95% 98% 98%  ?Weight:   (!) 137.8 kg   ?Height:      ? ?Neurology awake and alert ?ENT with no pallor ?Cardiovascular with S1 and S2 present and rhythmic with no gallops or murmurs ?Respiratory with no rales ?Abdomen protuberant but not distended ?Non pitting bilateral lower extremity edema  ?Data Reviewed: ? ? ? ?Family Communication: no family at the bedside  ? ?Disposition: ?Status is: Inpatient ?Remains inpatient appropriate because: heart and renal failure  ? Planned Discharge Destination: Home ? ? ? ? ? ?Author: ?Tawni Millers, MD ?12/02/2021 12:55 PM ? ?For on call review [email or web address].  ?

## 2021-12-02 NOTE — Plan of Care (Signed)
?  Problem: Education: ?Goal: Ability to demonstrate management of disease process will improve ?Outcome: Progressing ?  ?Problem: Activity: ?Goal: Capacity to carry out activities will improve ?Outcome: Progressing ?  ?Problem: Cardiac: ?Goal: Ability to achieve and maintain adequate cardiopulmonary perfusion will improve ?Outcome: Progressing ?  ?Problem: Clinical Measurements: ?Goal: Will remain free from infection ?Outcome: Progressing ?Goal: Diagnostic test results will improve ?Outcome: Progressing ?Goal: Respiratory complications will improve ?Outcome: Progressing ?Goal: Cardiovascular complication will be avoided ?Outcome: Progressing ?  ?Problem: Coping: ?Goal: Level of anxiety will decrease ?Outcome: Progressing ?  ?Problem: Elimination: ?Goal: Will not experience complications related to urinary retention ?Outcome: Progressing ?  ?Problem: Pain Managment: ?Goal: General experience of comfort will improve ?Outcome: Progressing ?  ?Problem: Safety: ?Goal: Ability to remain free from injury will improve ?Outcome: Progressing ?  ?Problem: Skin Integrity: ?Goal: Risk for impaired skin integrity will decrease ?Outcome: Progressing ?  ?

## 2021-12-03 DIAGNOSIS — I509 Heart failure, unspecified: Secondary | ICD-10-CM

## 2021-12-03 DIAGNOSIS — I5023 Acute on chronic systolic (congestive) heart failure: Secondary | ICD-10-CM | POA: Diagnosis not present

## 2021-12-03 LAB — BASIC METABOLIC PANEL
Anion gap: 7 (ref 5–15)
BUN: 54 mg/dL — ABNORMAL HIGH (ref 8–23)
CO2: 27 mmol/L (ref 22–32)
Calcium: 8.8 mg/dL — ABNORMAL LOW (ref 8.9–10.3)
Chloride: 106 mmol/L (ref 98–111)
Creatinine, Ser: 3.33 mg/dL — ABNORMAL HIGH (ref 0.44–1.00)
GFR, Estimated: 15 mL/min — ABNORMAL LOW (ref >60)
Glucose, Bld: 128 mg/dL — ABNORMAL HIGH (ref 70–99)
Potassium: 3.9 mmol/L (ref 3.5–5.1)
Sodium: 140 mmol/L (ref 135–145)

## 2021-12-03 MED ORDER — FUROSEMIDE 10 MG/ML IJ SOLN
80.0000 mg | Freq: Once | INTRAMUSCULAR | Status: AC
  Administered 2021-12-03: 80 mg via INTRAVENOUS
  Filled 2021-12-03: qty 8

## 2021-12-03 NOTE — Plan of Care (Signed)
?  Problem: Health Behavior/Discharge Planning: ?Goal: Ability to manage health-related needs will improve ?Outcome: Progressing patient got up and walked to the bedside toilet and managed her ADLs with just observation. ?  ?Problem: Nutrition: ?Goal: Adequate nutrition will be maintained ?Outcome: Progressing Patient was able to eat complete meal and 1 nutrition drink. ?  ?Problem: Elimination: ?Goal: Will not experience complications related to bowel motility ?Outcome: Patient is making progress to begin using the rest room independently. ?  ?

## 2021-12-03 NOTE — Progress Notes (Signed)
?PROGRESS NOTE ? ? ? 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 ?-Frequent hospitalizations with CHF, A-fib, recent admission in January with chest pain and shortness of breath, diuresed with Lasix and transition to torsemide 60 twice daily with metolazone 3 days a week.  Presented to the ED 4/3 with chest pain, increased shortness of breath, weight gain. ?-CT chest noted mild pulmonary edema and small pleural effusions, creatinine was 2.1, BNP 534 ?-4/6 right heart cath: Noted severely elevated biventricular pressures, severe RV failure, started on Lasix and milrinone gtt. ?-4/7 underwent DC cardioversion for A-fib ?-4/10: Noted to be encephalopathic, stuttering, this MRI and CT head were negative, seen by neurology, CTA head and neck unremarkable, mentation improving, suspected to be med related ?-Now with AKI on CKD 4, diuretics held ? ?Subjective: ?-Feels okay Today, denies any complaints, had some shortness of breath with activity yesterday ? ?Assessment & Plan: ? ?Acute on chronic systolic CHF ?-Echo with EF 30-35%, decreased RV function, severe TR, RVSP of 47 ?-Heart failure team following, right heart cath 4/6 noted PA pressures 67/37, wedge of 30, severely elevated biventricular pressures, moderate PAH, severe RV failure ?-Started on Lasix and milrinone gtt. ?-Diuresed, 15.7 L negative, milrinone discontinued 4/11 ?-Subsequently developed AKI, contrast nephropathy, diuretics held ?-Creatinine appears to be plateauing ? ?Atrial fibrillation with RVR ?-Status post DC cardioversion 4/7, also treated with amiodarone this admission, briefly held and then restarted at low-dose ?-Continue apixaban ? ?AKI on CKD 4 ?-Baseline  creatinine around 2-2.5, creatinine was in the 2.8 range, subsequently trended up to 3.2-suspect cardiorenal, worsened by contrast, following neuro work-up for encephalopathy with CTA neck ?-Avoid hypotension, creatinine appears to be plateauing around 3.3 now ? ?Toxic encephalopathy ?-Suspected to be secondary to medications primarily gabapentin, Benadryl, hydrocodone, morphine ?-Seen by neurology this admission, MRI brain negative for CVA, CTA head and neck unremarkable as well ? ?Anxiety, depression  ?-Continue buspirone and lower dose of duloxetine ? ?Type 2 diabetes mellitus with hyperlipidemia ([place]) ?-CBGs are stable, continue sliding scale insulin ? ?Acute mastoiditis, left ?Completed 7 days of Augmentin.  ? ?Class 3 obesity ([place]) ?Calculated BMI is 51,9  ? ?DVT prophylaxis: Apixaban ?Code Status: Full code ?Family Communication: Discussed with patient in detail, no family at bedside ?Disposition Plan: Home pending improvement in kidney function, CHF ? ?Consultants:  ?Heart failure team ? ?Procedures:  ? ?Antimicrobials:  ? ? ?Objective: ?Vitals:  ? 12/02/21 0348 12/02/21 1109 12/02/21 2058 12/03/21 0455  ?BP: (!) 118/56 117/74 (!) 114/57 111/63  ?Pulse: 79 79  80  ?Resp: '20 20 17 19  '$ ?Temp: 98.4 ?F (36.9 ?C) 98 ?F (36.7 ?C) 97.6 ?F (36.4 ?C) 98.1 ?F (36.7 ?C)  ?TempSrc: Oral Axillary Oral Oral  ?SpO2: 98% 98% 97% 100%  ?Weight: (!) 137.8 kg   (!) 137.5 kg  ?Height:      ? ? ?Intake/Output Summary (Last 24 hours) at 12/03/2021 1045 ?Last data filed at 12/03/2021 0945 ?Gross per 24 hour  ?Intake 843 ml  ?Output 1450 ml  ?Net -607 ml  ? ?Filed Weights  ? 12/01/21 0426 12/02/21 0348 12/03/21 0455  ?Weight: (!) 136.4 kg (!) 137.8 kg (!) 137.5 kg  ? ? ?Examination: ? ?General exam: Chronically ill female sitting up in bed, AAO x2, mild cognitive  deficits ?CVS: S1-S2, regular rhythm ?Lungs: Clear bilaterally ?Abdomen: Soft, obese, nontender, bowel sounds present ?Extremities: Trace edema, Unna boots on ?Neuro: Moves  all extremities, no localizing signs, intermittent stuttering noted  ? ? ? ?Data Reviewed:  ? ?CBC: ?Recent Labs  ?Lab 11/29/21 ?0322 11/30/21 ?0346  ?WBC 6.8 6.7  ?HGB 8.5* 8.7*  ?HCT 27.6* 28.0*  ?MCV 92.3 91.2  ?PLT 219 237  ? ?Basic Metabolic Panel: ?Recent Labs  ?Lab 11/28/21 ?9735 11/29/21 ?0322 11/30/21 ?0346 12/01/21 ?3299 12/02/21 ?0321 12/03/21 ?0359  ?NA 138 138 137 136 135 140  ?K 4.0 4.6 4.8 4.8 4.0 3.9  ?CL 100 101 102 101 101 106  ?CO2 '29 29 29 27 28 27  '$ ?GLUCOSE 175* 153* 154* 135* 138* 128*  ?BUN 36* 38* 38* 45* 51* 54*  ?CREATININE 2.60* 2.78* 2.89* 3.26* 3.29* 3.33*  ?CALCIUM 9.2 8.9 9.1 9.1 8.9 8.8*  ?MG 2.0 2.1  --   --   --   --   ? ?GFR: ?Estimated Creatinine Clearance: 24.5 mL/min (A) (by C-G formula based on SCr of 3.33 mg/dL (H)). ?Liver Function Tests: ?No results for input(s): AST, ALT, ALKPHOS, BILITOT, PROT, ALBUMIN in the last 168 hours. ?No results for input(s): LIPASE, AMYLASE in the last 168 hours. ?No results for input(s): AMMONIA in the last 168 hours. ?Coagulation Profile: ?No results for input(s): INR, PROTIME in the last 168 hours. ?Cardiac Enzymes: ?No results for input(s): CKTOTAL, CKMB, CKMBINDEX, TROPONINI in the last 168 hours. ?BNP (last 3 results) ?No results for input(s): PROBNP in the last 8760 hours. ?HbA1C: ?No results for input(s): HGBA1C in the last 72 hours. ?CBG: ?Recent Labs  ?Lab 11/28/21 ?0732 11/28/21 ?0814  ?GLUCAP 181* 183*  ? ?Lipid Profile: ?No results for input(s): CHOL, HDL, LDLCALC, TRIG, CHOLHDL, LDLDIRECT in the last 72 hours. ?Thyroid Function Tests: ?No results for input(s): TSH, T4TOTAL, FREET4, T3FREE, THYROIDAB in the last 72 hours. ?Anemia Panel: ?No results for input(s): VITAMINB12, FOLATE, FERRITIN, TIBC, IRON, RETICCTPCT in the last 72 hours. ?Urine analysis: ?   ?Component Value Date/Time  ? [place] YELLOW 11/21/2021 1926  ? APPEARANCEUR CLEAR 11/21/2021 1926  ? LABSPEC 1.008 11/21/2021 1926  ? PHURINE 5.0 11/21/2021 1926  ? GLUCOSEU  NEGATIVE 11/21/2021 1926  ? HGBUR SMALL (A) 11/21/2021 1926  ? [place] NEGATIVE 11/21/2021 1926  ? BILIRUBINUR negative 09/24/2019 1742  ? [place] Stain NEGATIVE 11/21/2021 1926  ? PROTEINUR 30 (A) 11/21/2021 1926  ? UROBILINOGEN 1.0 09/24/2019 1742  ? UROBILINOGEN 1.0 10/26/2014 0249  ? NITRITE NEGATIVE 11/21/2021 1926  ? LEUKOCYTESUR NEGATIVE 11/21/2021 1926  ? ?Sepsis Labs: ?'@LABRCNTIP'$ (procalcitonin:4,lacticidven:4) ? ?)No results found for this or any previous visit (from the past 240 hour(s)).  ? ?Radiology Studies: ?No results found. ? ? ?Scheduled Meds: ? amiodarone  200 mg Oral Daily  ? apixaban  5 mg Oral BID  ? busPIRone  5 mg Oral BID  ? colchicine  0.3 mg Oral Daily  ? DULoxetine  30 mg Oral Daily  ? metoprolol succinate  25 mg Oral Daily  ? multivitamin with minerals  1 tablet Oral Daily  ? pantoprazole  40 mg Oral Daily  ? rosuvastatin  10 mg Oral QPM  ? sodium chloride flush  3 mL Intravenous Q12H  ? sodium chloride flush  3 mL Intravenous Q12H  ? sodium chloride flush  3 mL Intravenous Q12H  ? torsemide  40 mg Oral Daily  ? ?Continuous Infusions: ? sodium chloride    ? sodium chloride    ? ? ?  LOS: 11 days  ? ? ?Time spent: 45mn ? ?PDomenic Polite MD ?[facility] ? ? ?12/03/2021, 10:45 AM  ?  ?

## 2021-12-03 NOTE — Progress Notes (Signed)
Mobility Specialist Progress Note: ? ? 12/03/21 1150  ?Mobility  ?Activity  ?(Chair level exercises)  ?Range of Motion/Exercises Active;All extremities  ?Level of Assistance Independent  ?Activity Response Tolerated well  ?$Mobility charge 1 Mobility  ? ?Pt agreeable to second mobility session. Performed multiple chair level exercises. Pt left sitting in chair with all needs met.  ? ?Nelta Numbers ?Acute Rehab ?Phone: [phone] ?Office Phone: [phone] ? ?

## 2021-12-03 NOTE — Progress Notes (Signed)
Mobility Specialist Progress Note: ? ? 12/03/21 0900  ?Mobility  ?Activity Ambulated with assistance in hallway  ?Level of Assistance Standby assist, set-up cues, supervision of patient - no hands on  ?Assistive Device Four wheel walker  ?Distance Ambulated (ft) 250 ft  ?Activity Response Tolerated well  ?$Mobility charge 1 Mobility  ? ?Pt eager for mobility session. Ambulated at supervision level with rollator. Pt required x2 seated rest breaks d/t fatigue. SpO2 99% upon exertion. Pt left sitting in chair with all needs met.  ? ?Nelta Numbers ?Acute Rehab ?Phone: [phone] ?Office Phone: [phone] ? ?

## 2021-12-03 NOTE — Progress Notes (Signed)
? ? [facility] Rounding Note ? ?PCP-Cardiologist: Buford Dresser, MD  ? ?Subjective:   ? ?04/03: Admitted with A fib RVR and A/C HFrEF.  Started on amio drip. Diuresing with IV lasix.   ?04/06: RHC with severely elevated biventricular pressures. Severe RV failure. Started milrinone 0.125 + lasix gtt  ?04/07: S/p DCCV, Afib >>>NSR ?04/10: AMS . Code stroke called. MRI/Head CT negative. ? Related to amio. Diuretics stopped.  ? ?Creatinine stabilizing out 2.45>2.6 (contrast) >2.8 >2.9>3.26>3.29>3.33 ?Received 1 dose of lasix overnight; -997 ? ?Feels okay today. States her edema improved slightly with IV lasix. Stuttering resolved on exam for me today. ? ? ?Objective:   ?Weight Range: ?(!) 137.5 kg ?Body mass index is 47.49 kg/m?.  ? ?Vital Signs:   ?Temp:  [97.6 ?F (36.4 ?C)-98.8 ?F (37.1 ?C)] 98.8 ?F (37.1 ?C) (04/15 1100) ?Pulse Rate:  [80-91] 91 (04/15 1100) ?Resp:  [17-19] 18 (04/15 1100) ?BP: (111-128)/(57-83) 128/83 (04/15 1100) ?SpO2:  [97 %-100 %] 97 % (04/15 1100) ?Weight:  [137.5 kg] 137.5 kg (04/15 0455) ?Last BM Date : 12/02/21 ? ?Weight change: ?Filed Weights  ? 12/01/21 0426 12/02/21 0348 12/03/21 0455  ?Weight: (!) 136.4 kg (!) 137.8 kg (!) 137.5 kg  ? ? ?Intake/Output:  ? ?Intake/Output Summary (Last 24 hours) at 12/03/2021 1237 ?Last data filed at 12/03/2021 1000 ?Gross per 24 hour  ?Intake 1083 ml  ?Output 1450 ml  ?Net -367 ml  ?  ? ? ?Physical Exam  ?General:  Comfortable, sitting up in bed ?HEENT: normal ?Neck: Supple. JVD to angle of the mandible. Carotids 2+ bilat; no bruits.  ?Cor: Regular rate & rhythm. 2/6 systolic murmur.  ?Lungs: Faint crackles at right lung base ?Abdomen: Obese, soft ?Extremities: no cyanosis, clubbing, rash, R and LLE unna boots. 1-2+ pitting edema ?Neuro: alert & orientedx3, cranial nerves grossly intact. moves all 4 extremities w/o difficulty. Affect pleasant ? ? ? ?Telemetry  ? ?NSR, rare PVCs- (personally reviewed) ? ? ?Labs  ?  ?CBC ?No results for input(s): WBC,  NEUTROABS, HGB, HCT, MCV, PLT in the last 72 hours. ? ?Basic Metabolic Panel ?Recent Labs  ?  12/02/21 ?0321 12/03/21 ?0359  ?NA 135 140  ?K 4.0 3.9  ?CL 101 106  ?CO2 28 27  ?GLUCOSE 138* 128*  ?BUN 51* 54*  ?CREATININE 3.29* 3.33*  ?CALCIUM 8.9 8.8*  ? ?Liver Function Tests ?No results for input(s): AST, ALT, ALKPHOS, BILITOT, PROT, ALBUMIN in the last 72 hours. ? ?No results for input(s): LIPASE, AMYLASE in the last 72 hours. ? ?Cardiac Enzymes ?No results for input(s): CKTOTAL, CKMB, CKMBINDEX, TROPONINI in the last 72 hours. ? ? ?BNP: ?BNP (last 3 results) ?Recent Labs  ?  08/31/21 ?0114 09/23/21 ?1554 11/21/21 ?1122  ?BNP 132.7* 31.1 534.8*  ? ? ?ProBNP (last 3 results) ?No results for input(s): PROBNP in the last 8760 hours. ? ? ?D-Dimer ?No results for input(s): DDIMER in the last 72 hours. ? ?Hemoglobin A1C ?No results for input(s): HGBA1C in the last 72 hours. ? ?Fasting Lipid Panel ?No results for input(s): CHOL, HDL, LDLCALC, TRIG, CHOLHDL, LDLDIRECT in the last 72 hours. ?Thyroid Function Tests ?No results for input(s): TSH, T4TOTAL, T3FREE, THYROIDAB in the last 72 hours. ? ?Invalid input(s): FREET3 ? ? ?Other results: ? ? ?Imaging  ? ? ?No results found. ? ? ?Medications:   ? ? ?Scheduled Medications: ? amiodarone  200 mg Oral Daily  ? apixaban  5 mg Oral BID  ? busPIRone  5 mg Oral  BID  ? colchicine  0.3 mg Oral Daily  ? DULoxetine  30 mg Oral Daily  ? furosemide  80 mg Intravenous Once  ? metoprolol succinate  25 mg Oral Daily  ? multivitamin with minerals  1 tablet Oral Daily  ? pantoprazole  40 mg Oral Daily  ? rosuvastatin  10 mg Oral QPM  ? sodium chloride flush  3 mL Intravenous Q12H  ? sodium chloride flush  3 mL Intravenous Q12H  ? sodium chloride flush  3 mL Intravenous Q12H  ? ? ?Infusions: ? sodium chloride    ? sodium chloride    ? ? ?PRN Medications: ?sodium chloride, sodium chloride, acetaminophen **OR** acetaminophen, albuterol, sodium chloride flush, sodium chloride  flush ? ? ? ?Patient Profile  ?66 y.o. female with history of morbid obesity, chronic diastolic and systolic HF with mildly reduced LVEF (45-50%), severe MR/TR, atrial fibrillation on Eliquis, HTN, and DMII who was admitted with Afib with RVR and acute on chronic HFrEF. ? ?Assessment/Plan  ?1. A fib RVR ?- She has failed Tikosyn ?- Maze in 5/22 and DCCV in 6/22 & 04/19/21, last of which she had ERAF. ?- Saw Dr Quentin Ore in February, discussed AV nodal ablation + CRT if recurrent atrial arrhythmias ?- S/p successful DCCV on 04/07.  ?- Amio gtt switched to po amio 400 mg BID on 04/08.  ?- Initially stopped amio d/t concern contributing to neuro issus. Amio restarted 04/12. Do not feel amio contributing stuttering. ?- Currently maintaining NSR ?- Continue Eliquis 5 mg BID. ?  ?2. A/C HFrEF,NICM ?- RHC (5/22): elevated R>L filling pressures with PAPi low, suggestive of significant component of RV dysfunction, prominent v-waves in PCWP and RA tracings suggestive of MR/TR, CO low ?- RHC (6/22) with low filling pressures with moderately reduced CO. ?- RHC 07/2021 normal filling pressures and moderately reduced cardiac output.  ?- Echo (7/22) in setting of AFL with RVR, showed EF 30-35%, moderate LVH, moderate RV dysfunction, PASP 48, s/p MV repair with mild MR and mean gradient 10 mmHg with HR around 130, s/p TV repair with mild-moderate TR, dilated IVC.  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  ?- Echo 11/22/21: EF 30-35% RV moderately reduced, RVSP 47 mmHg, severe BAE, mild MS, severe TR despite TV repair ?- RHC 11/24/21: RA 29, PA 67/37 (45), PCW 30 ( v waves to 45), Fick CO/CI 6.2/2.5, PA sat 50%, 55%, PAPi 1.0. Severely elevated biventricular pressures, moderate pulmonary hypertension with marked V waves in PCWP tracing and severe RV failure. ?- Suspect AF contributing to decompensation. S/p successful DCCV on 04/07 as above. ?- Milrinone stopped 4/11.   ?- Appears volume up today; will re-dose lasix '80mg'$  IV and monitor  response  ?- GDMT limited by AKI on CKD, eventually consider spiro and SGLT2i ?- Continue toprol xl 25 mg daily ?- Continue UNNA boots ? ?3. PVCs ?- Improved off milrinone ?- On amiodarone as above ? ?4. Valvular Heart Disease ?-S/p MV and TV repair in 5/22 ?-Echo 04/23: EF 30-35%, RV moderately reduced, severe TR ?-RHC 4/6 .  Severely elevated biventricular pressures, moderate pulmonary hypertension and severe RV failure. PAPi 1.0. ?-Not sure if there are any options for repair. Need to allow for renal recovery at this point. ? ?5. CAD/Chest pain ?- trivial CAD on heart cath 2019 ?- hstrop negative. No evidence of ischemia ?  ?6. CKD Stage IV ?- baseline Scr 2-2.5 ?- Scr 2.09>2.46.2.63>2.54>2.45>2.60>2.8 >2.9>3.26>3.29>3.33 ?- Re-dose lasix '80mg'$  IV and monitor response ?  ?7. DMII ?-  per primary  ?- consider SGLT2i as renal function stabilizes ?  ?8. Morbid Obesity  ?- needs weight loss ?- May be candidate for GLP1RA ? ?9. Anemia ?- Baseline 10-12, Hgb 8.7 4/12 ?- No bleeding issues.  ?- Iron stores low. Consider feraheme once treatment for mastoiditis complete. ? ?10. Confusion/slurred speech/tremor. Stuttering ?- 4/10 Code stroke activated.Neurology consulted.  ?- MRI - no acute findings. CT unrevealing.  ?- Has not missed any doses of Eliquis since admission. ?- ? Amiodarone contributing to tremor. Amio stopped but added back 04/12 to maintain sinus rhythm. Doubt contributing to stutter. Uncertain etiology. ?- Stuttering resolved this AM ?- Medications including narcotics, benadryl and duloxetine adjusted by primary team ? ?Disposition: ?PT/OT suggested [facility] PT/OT.  ? ? ?CIR is not an option at her functional level. ? SNF versus HH.  ?  ?Length of Stay: 11 ? ?Freada Bergeron, MD  ?12/03/2021, 12:37 PM ? ? ? ? ? ? ?

## 2021-12-03 NOTE — Plan of Care (Signed)
?  Problem: Nutrition: ?Goal: Adequate nutrition will be maintained ?Outcome: Progressing ?  ?Problem: Coping: ?Goal: Level of anxiety will decrease ?Outcome: Progressing ?  ?Problem: Elimination: ?Goal: Will not experience complications related to urinary retention ?Outcome: Progressing ?  ?

## 2021-12-04 DIAGNOSIS — I5023 Acute on chronic systolic (congestive) heart failure: Secondary | ICD-10-CM | POA: Diagnosis not present

## 2021-12-04 LAB — CBC
HCT: 28.5 % — ABNORMAL LOW (ref 36.0–46.0)
Hemoglobin: 8.8 g/dL — ABNORMAL LOW (ref 12.0–15.0)
MCH: 28.1 pg (ref 26.0–34.0)
MCHC: 30.9 g/dL (ref 30.0–36.0)
MCV: 91.1 fL (ref 80.0–100.0)
Platelets: 232 10*3/uL (ref 150–400)
RBC: 3.13 MIL/uL — ABNORMAL LOW (ref 3.87–5.11)
RDW: 14.9 % (ref 11.5–15.5)
WBC: 6 10*3/uL (ref 4.0–10.5)
nRBC: 0 % (ref 0.0–0.2)

## 2021-12-04 LAB — BASIC METABOLIC PANEL
Anion gap: 8 (ref 5–15)
BUN: 52 mg/dL — ABNORMAL HIGH (ref 8–23)
CO2: 27 mmol/L (ref 22–32)
Calcium: 8.8 mg/dL — ABNORMAL LOW (ref 8.9–10.3)
Chloride: 104 mmol/L (ref 98–111)
Creatinine, Ser: 3.4 mg/dL — ABNORMAL HIGH (ref 0.44–1.00)
GFR, Estimated: 14 mL/min — ABNORMAL LOW (ref >60)
Glucose, Bld: 131 mg/dL — ABNORMAL HIGH (ref 70–99)
Potassium: 3.5 mmol/L (ref 3.5–5.1)
Sodium: 139 mmol/L (ref 135–145)

## 2021-12-04 LAB — GLUCOSE, CAPILLARY: Glucose-Capillary: 92 mg/dL (ref 70–99)

## 2021-12-04 MED ORDER — FUROSEMIDE 10 MG/ML IJ SOLN
80.0000 mg | Freq: Once | INTRAMUSCULAR | Status: AC
  Administered 2021-12-04: 80 mg via INTRAVENOUS
  Filled 2021-12-04: qty 8

## 2021-12-04 NOTE — Progress Notes (Signed)
? ? [facility] Rounding Note ? ?PCP-Cardiologist: Buford Dresser, MD  ? ?Subjective:   ? ?04/03: Admitted with A fib RVR and A/C HFrEF.  Started on amio drip. Diuresing with IV lasix.   ?04/06: RHC with severely elevated biventricular pressures. Severe RV failure. Started milrinone 0.125 + lasix gtt  ?04/07: S/p DCCV, Afib >>>NSR ?04/10: AMS . Code stroke called. MRI/Head CT negative. ? Related to amio. Diuretics stopped.  ? ?Creatinine stabilizing out 2.45>2.6 (contrast) >2.8 >2.9>3.26>3.29>3.33>3.4 ?Received 1 dose of lasix overnight; -1700 ?States she is feeling better. Complains of LE edema. States she thinks "the fluid is back." ? ? ?Objective:   ?Weight Range: ?(!) 138.1 kg ?Body mass index is 47.68 kg/m?.  ? ?Vital Signs:   ?Temp:  [98.4 ?F (36.9 ?C)-98.8 ?F (37.1 ?C)] 98.4 ?F (36.9 ?C) (04/16 0531) ?Pulse Rate:  [82-91] 82 (04/16 0531) ?Resp:  [18-19] 19 (04/16 0531) ?BP: (108-128)/(56-83) 108/56 (04/16 0531) ?SpO2:  [95 %-97 %] 97 % (04/16 0531) ?Weight:  [138.1 kg] 138.1 kg (04/16 0534) ?Last BM Date : 12/03/21 ? ?Weight change: ?Filed Weights  ? 12/02/21 0348 12/03/21 0455 12/04/21 0534  ?Weight: (!) 137.8 kg (!) 137.5 kg (!) 138.1 kg  ? ? ?Intake/Output:  ? ?Intake/Output Summary (Last 24 hours) at 12/04/2021 0612 ?Last data filed at 12/04/2021 0533 ?Gross per 24 hour  ?Intake 600 ml  ?Output 2300 ml  ?Net -1700 ml  ? ?  ? ? ?Physical Exam  ?General:  Comfortable, sitting up in bed ?HEENT: normal ?Neck: Supple. JVD to midneck. Prominent V-waves. Carotids 2+ bilat; no bruits.  ?Cor: Regular rate & rhythm. 2/6 systolic murmur.  ?Lungs: Faint crackles at right lung base ?Abdomen: Obese, soft ?Extremities: no cyanosis, clubbing, rash, R and LLE unna boots. 1+ pitting edema.  ?Neuro: alert & orientedx3, cranial nerves grossly intact. moves all 4 extremities w/o difficulty. Affect pleasant ? ? ? ?Telemetry  ? ?NSR, rare PVCs- (personally reviewed) ? ? ?Labs  ?  ?CBC ?Recent Labs  ?  12/04/21 ?0403  ?WBC 6.0   ?HGB 8.8*  ?HCT 28.5*  ?MCV 91.1  ?PLT 232  ? ? ?Basic Metabolic Panel ?Recent Labs  ?  12/03/21 ?0359 12/04/21 ?0403  ?NA 140 139  ?K 3.9 3.5  ?CL 106 104  ?CO2 27 27  ?GLUCOSE 128* 131*  ?BUN 54* 52*  ?CREATININE 3.33* 3.40*  ?CALCIUM 8.8* 8.8*  ? ? ?Liver Function Tests ?No results for input(s): AST, ALT, ALKPHOS, BILITOT, PROT, ALBUMIN in the last 72 hours. ? ?No results for input(s): LIPASE, AMYLASE in the last 72 hours. ? ?Cardiac Enzymes ?No results for input(s): CKTOTAL, CKMB, CKMBINDEX, TROPONINI in the last 72 hours. ? ? ?BNP: ?BNP (last 3 results) ?Recent Labs  ?  08/31/21 ?0114 09/23/21 ?1554 11/21/21 ?1122  ?BNP 132.7* 31.1 534.8*  ? ? ? ?ProBNP (last 3 results) ?No results for input(s): PROBNP in the last 8760 hours. ? ? ?D-Dimer ?No results for input(s): DDIMER in the last 72 hours. ? ?Hemoglobin A1C ?No results for input(s): HGBA1C in the last 72 hours. ? ?Fasting Lipid Panel ?No results for input(s): CHOL, HDL, LDLCALC, TRIG, CHOLHDL, LDLDIRECT in the last 72 hours. ?Thyroid Function Tests ?No results for input(s): TSH, T4TOTAL, T3FREE, THYROIDAB in the last 72 hours. ? ?Invalid input(s): FREET3 ? ? ?Other results: ? ? ?Imaging  ? ? ?No results found. ? ? ?Medications:   ? ? ?Scheduled Medications: ? amiodarone  200 mg Oral Daily  ? apixaban  5 mg  Oral BID  ? busPIRone  5 mg Oral BID  ? colchicine  0.3 mg Oral Daily  ? DULoxetine  30 mg Oral Daily  ? metoprolol succinate  25 mg Oral Daily  ? multivitamin with minerals  1 tablet Oral Daily  ? pantoprazole  40 mg Oral Daily  ? rosuvastatin  10 mg Oral QPM  ? sodium chloride flush  3 mL Intravenous Q12H  ? sodium chloride flush  3 mL Intravenous Q12H  ? sodium chloride flush  3 mL Intravenous Q12H  ? ? ?Infusions: ? sodium chloride    ? sodium chloride    ? ? ?PRN Medications: ?sodium chloride, sodium chloride, acetaminophen **OR** acetaminophen, albuterol, sodium chloride flush, sodium chloride flush ? ? ? ?Patient Profile  ?66 y.o. female with  history of morbid obesity, chronic diastolic and systolic HF with mildly reduced LVEF (45-50%), severe MR/TR, atrial fibrillation on Eliquis, HTN, and DMII who was admitted with Afib with RVR and acute on chronic HFrEF. ? ?Assessment/Plan  ?1. A fib RVR ?- She has failed Tikosyn ?- Maze in 5/22 and DCCV in 6/22 & 04/19/21, last of which she had ERAF. ?- Saw Dr Quentin Ore in February, discussed AV nodal ablation + CRT if recurrent atrial arrhythmias ?- S/p successful DCCV on 04/07.  ?- Amio gtt switched to po amio 400 mg BID on 04/08.  ?- Initially stopped amio d/t concern contributing to neuro issus. Amio restarted 04/12. Do not feel amio contributing stuttering. ?- Currently maintaining NSR ?- Continue Eliquis 5 mg BID. ?  ?2. A/C HFrEF,NICM ?- RHC (5/22): elevated R>L filling pressures with PAPi low, suggestive of significant component of RV dysfunction, prominent v-waves in PCWP and RA tracings suggestive of MR/TR, CO low ?- RHC (6/22) with low filling pressures with moderately reduced CO. ?- RHC 07/2021 normal filling pressures and moderately reduced cardiac output.  ?- Echo (7/22) in setting of AFL with RVR, showed EF 30-35%, moderate LVH, moderate RV dysfunction, PASP 48, s/p MV repair with mild MR and mean gradient 10 mmHg with HR around 130, s/p TV repair with mild-moderate TR, dilated IVC.  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  ?- Echo 11/22/21: EF 30-35% RV moderately reduced, RVSP 47 mmHg, severe BAE, mild MS, severe TR despite TV repair ?- RHC 11/24/21: RA 29, PA 67/37 (45), PCW 30 ( v waves to 45), Fick CO/CI 6.2/2.5, PA sat 50%, 55%, PAPi 1.0. Severely elevated biventricular pressures, moderate pulmonary hypertension with marked V waves in PCWP tracing and severe RV failure. ?- Suspect AF contributing to decompensation. S/p successful DCCV on 04/07 as above. ?- Milrinone stopped 4/11.   ?- Mildly volume up on exam today; will re-dose lasix '80mg'$  IV and monitor response. Hopefully transition back to torsemide  tomorrow. ?- GDMT limited by AKI on CKD, eventually consider spiro and SGLT2i ?- Continue toprol xl 25 mg daily ?- Continue UNNA boots ? ?3. PVCs ?- Improved off milrinone ?- On amiodarone as above ? ?4. Valvular Heart Disease ?-S/p MV and TV repair in 5/22 ?-Echo 04/23: EF 30-35%, RV moderately reduced, severe TR ?-RHC 4/6 .  Severely elevated biventricular pressures, moderate pulmonary hypertension and severe RV failure. PAPi 1.0. ?-Not sure if there are any options for repair. Need to allow for renal recovery at this point. ? ?5. CAD/Chest pain ?- trivial CAD on heart cath 2019 ?- hstrop negative. No evidence of ischemia ?  ?6. CKD Stage IV ?- baseline Scr 2-2.5 ?- Scr 2.09>2.46.2.63>2.54>2.45>2.60>2.8 >2.9>3.26>3.29>3.33>3.4 ?- Re-dose lasix '80mg'$   IV and monitor response; hopefully transition back to torsemide tomorrow ?  ?7. DMII ?- per primary  ?- consider SGLT2i as renal function stabilizes ?  ?8. Morbid Obesity  ?- needs weight loss ?- May be candidate for GLP1RA ? ?9. Anemia ?- Baseline 10-12, Hgb 8.7 4/12 ?- No bleeding issues.  ?- Iron stores low. Consider feraheme once treatment for mastoiditis complete. ? ?10. Confusion/slurred speech/tremor. Stuttering ?- 4/10 Code stroke activated.Neurology consulted.  ?- MRI - no acute findings. CT unrevealing.  ?- Has not missed any doses of Eliquis since admission. ?- ? Amiodarone contributing to tremor. Amio stopped but added back 04/12 to maintain sinus rhythm. Doubt contributing to stutter. Uncertain etiology. ?- Stuttering resolved this AM ?- Medications including narcotics, benadryl and duloxetine adjusted by primary team ? ?Disposition: ?PT/OT suggested [facility] PT/OT.  ? ? ?CIR is not an option at her functional level. ? SNF versus HH.  ?  ?Length of Stay: 12 ? ?Freada Bergeron, MD  ?12/04/2021, 6:12 AM ? ? ? ? ? ? ?

## 2021-12-04 NOTE — Progress Notes (Signed)
?PROGRESS NOTE ? ? ? ?[name]  AST:[id number] DOB: 04/24/1956 DOA: 11/21/2021 ?PCP: [name], NP  ?Narrative 65/female with history of morbid obesity, chronic systolic and diastolic CHF, EF 98%, severe MR, TR status post mitral and tricuspid valve replacement, paroxysmal A-fib on Eliquis, type 2 diabetes mellitus, hypertension. ?-Frequent hospitalizations with CHF, A-fib, recent admission in January with chest pain and shortness of breath, diuresed with Lasix and transition to torsemide 60 twice daily with metolazone 3 days a week.  Presented to the ED 4/3 with chest pain, increased shortness of breath, weight gain. ?-CT chest noted mild pulmonary edema and small pleural effusions, creatinine was 2.1, BNP 534 ?-4/6 right heart cath: Noted severely elevated biventricular pressures, severe RV failure, started on Lasix and milrinone gtt. ?-4/7 underwent DC cardioversion for A-fib ?-4/10: Noted to be encephalopathic, stuttering, this MRI and CT head were negative, seen by neurology, CTA head and neck unremarkable, mentation improving, suspected to be med related ?-Now with AKI on CKD 4, diuretics held ? ?Subjective: ?-Feels well, denies any complaints today ? ?Assessment & Plan: ? ?Acute on chronic systolic CHF ?-Echo with EF 30-35%, decreased RV function, severe TR, RVSP of 47 ?-Heart failure team following, right heart cath 4/6 noted PA pressures 67/37, wedge of 30, severely elevated biventricular pressures, moderate PAH, severe RV failure ?-Started on Lasix and milrinone gtt. ?-Diuresed, 17.4 L negative, milrinone discontinued 4/11 ?-Subsequently developed AKI, contrast nephropathy,  ?-Creatinine appears to be plateauing, cardiology following Lasix x1 today ? ?Atrial fibrillation with RVR ?-Status post DC cardioversion 4/7, also treated with amiodarone this admission, briefly held and then restarted at low-dose ?-Continue apixaban ? ?Valvular Heart Disease ?-S/p MV and TV repair in 5/22 ?-Echo  04/23: EF 30-35%, RV function moderately reduced, severe TR ?-RHC 4/6 .  Severely elevated biventricular pressures, moderate pulmonary hypertension and severe RV failure ? ?AKI on CKD 4 ?-Baseline creatinine around 2-2.5, creatinine was in the 2.8 range, subsequently trended up to 3.4 ?-suspect cardiorenal, worsened by contrast, following neuro work-up for encephalopathy with CTA neck ?-Avoid hypotension, creatinine plateauing, hopeful for improvement ? ?Toxic encephalopathy ?-Suspected to be secondary to medications primarily gabapentin, Benadryl, hydrocodone, morphine ?-Seen by neurology this admission, MRI brain negative for CVA, CTA head and neck unremarkable as well ? ?Anxiety, depression  ?-Continue buspirone and lower dose of duloxetine ? ?Type 2 diabetes mellitus with hyperlipidemia ([place]) ?-CBGs are stable, continue sliding scale insulin ? ?Acute mastoiditis, left ?Completed 7 days of Augmentin.  ? ?Class 3 obesity ([place]) ?Calculated BMI is 51,9  ? ?DVT prophylaxis: Apixaban ?Code Status: Full code ?Family Communication: Discussed with patient in detail, no family at bedside ?Disposition Plan: Home pending improvement in kidney function, CHF ? ?Consultants:  ?Heart failure team ? ?Procedures:  ? ?Antimicrobials:  ? ? ?Objective: ?Vitals:  ? 12/04/21 0531 12/04/21 0534 12/04/21 0726 12/04/21 0904  ?BP: (!) 108/56  96/66 115/62  ?Pulse: 82  (!) 119 80  ?Resp: 19  18   ?Temp: 98.4 ?F (36.9 ?C)  98 ?F (36.7 ?C)   ?TempSrc: Oral  Oral   ?SpO2: 97%     ?Weight:  (!) 138.1 kg    ?Height:      ? ? ?Intake/Output Summary (Last 24 hours) at 12/04/2021 1120 ?Last data filed at 12/04/2021 [phone] ?Gross per 24 hour  ?Intake 118 ml  ?Output 2050 ml  ?Net -1932 ml  ? ?Filed Weights  ? 12/02/21 0348 12/03/21 0455 12/04/21 0534  ?Weight: (!) 137.8 kg (!) 137.5  kg (!) 138.1 kg  ? ? ?Examination: ? ?General exam: Chronically ill female sitting up in bed, AAO x2, mild cognitive deficits ?CVS: S1-S2, regular rhythm ?Lungs: Clear  bilaterally ?Abdomen: Soft, obese, nontender, bowel sounds present ?Extremities: Trace edema, Unna boots on ?Neuro: Moves all extremities, no localizing signs, intermittent stuttering noted  ? ? ? ?Data Reviewed:  ? ?CBC: ?Recent Labs  ?Lab 11/29/21 ?0322 11/30/21 ?0346 12/04/21 ?0403  ?WBC 6.8 6.7 6.0  ?HGB 8.5* 8.7* 8.8*  ?HCT 27.6* 28.0* 28.5*  ?MCV 92.3 91.2 91.1  ?PLT 219 237 232  ? ?Basic Metabolic Panel: ?Recent Labs  ?Lab 11/28/21 ?4782 11/29/21 ?0322 11/30/21 ?0346 12/01/21 ?9562 12/02/21 ?0321 12/03/21 ?1308 12/04/21 ?0403  ?NA 138 138 137 136 135 140 139  ?K 4.0 4.6 4.8 4.8 4.0 3.9 3.5  ?CL 100 101 102 101 101 106 104  ?CO2 '29 29 29 27 28 27 27  '$ ?GLUCOSE 175* 153* 154* 135* 138* 128* 131*  ?BUN 36* 38* 38* 45* 51* 54* 52*  ?CREATININE 2.60* 2.78* 2.89* 3.26* 3.29* 3.33* 3.40*  ?CALCIUM 9.2 8.9 9.1 9.1 8.9 8.8* 8.8*  ?MG 2.0 2.1  --   --   --   --   --   ? ?GFR: ?Estimated Creatinine Clearance: 24 mL/min (A) (by C-G formula based on SCr of 3.4 mg/dL (H)). ?Liver Function Tests: ?No results for input(s): AST, ALT, ALKPHOS, BILITOT, PROT, ALBUMIN in the last 168 hours. ?No results for input(s): LIPASE, AMYLASE in the last 168 hours. ?No results for input(s): AMMONIA in the last 168 hours. ?Coagulation Profile: ?No results for input(s): INR, PROTIME in the last 168 hours. ?Cardiac Enzymes: ?No results for input(s): CKTOTAL, CKMB, CKMBINDEX, TROPONINI in the last 168 hours. ?BNP (last 3 results) ?No results for input(s): PROBNP in the last 8760 hours. ?HbA1C: ?No results for input(s): HGBA1C in the last 72 hours. ?CBG: ?Recent Labs  ?Lab 11/28/21 ?0732 11/28/21 ?0814  ?GLUCAP 181* 183*  ? ?Lipid Profile: ?No results for input(s): CHOL, HDL, LDLCALC, TRIG, CHOLHDL, LDLDIRECT in the last 72 hours. ?Thyroid Function Tests: ?No results for input(s): TSH, T4TOTAL, FREET4, T3FREE, THYROIDAB in the last 72 hours. ?Anemia Panel: ?No results for input(s): VITAMINB12, FOLATE, FERRITIN, TIBC, IRON, RETICCTPCT in the last  72 hours. ?Urine analysis: ?   ?Component Value Date/Time  ? [place] YELLOW 11/21/2021 1926  ? APPEARANCEUR CLEAR 11/21/2021 1926  ? LABSPEC 1.008 11/21/2021 1926  ? PHURINE 5.0 11/21/2021 1926  ? GLUCOSEU NEGATIVE 11/21/2021 1926  ? HGBUR SMALL (A) 11/21/2021 1926  ? [place] NEGATIVE 11/21/2021 1926  ? BILIRUBINUR negative 09/24/2019 1742  ? [place] Stain NEGATIVE 11/21/2021 1926  ? PROTEINUR 30 (A) 11/21/2021 1926  ? UROBILINOGEN 1.0 09/24/2019 1742  ? UROBILINOGEN 1.0 10/26/2014 0249  ? NITRITE NEGATIVE 11/21/2021 1926  ? LEUKOCYTESUR NEGATIVE 11/21/2021 1926  ? ?Sepsis Labs: ?'@LABRCNTIP'$ (procalcitonin:4,lacticidven:4) ? ?)No results found for this or any previous visit (from the past 240 hour(s)).  ? ?Radiology Studies: ?No results found. ? ? ?Scheduled Meds: ? amiodarone  200 mg Oral Daily  ? apixaban  5 mg Oral BID  ? busPIRone  5 mg Oral BID  ? colchicine  0.3 mg Oral Daily  ? DULoxetine  30 mg Oral Daily  ? furosemide  80 mg Intravenous Once  ? metoprolol succinate  25 mg Oral Daily  ? multivitamin with minerals  1 tablet Oral Daily  ? pantoprazole  40 mg Oral Daily  ? rosuvastatin  10 mg Oral QPM  ? sodium  chloride flush  3 mL Intravenous Q12H  ? sodium chloride flush  3 mL Intravenous Q12H  ? sodium chloride flush  3 mL Intravenous Q12H  ? ?Continuous Infusions: ? sodium chloride    ? sodium chloride    ? ? ? LOS: 12 days  ? ? ?Time spent: 70mn ? ?PDomenic Polite MD ?[facility] ? ? ?12/04/2021, 11:20 AM  ?  ?

## 2021-12-04 NOTE — Progress Notes (Signed)
Mobility Specialist Progress Note: ? ? 12/04/21 1100  ?Mobility  ?Activity Ambulated with assistance in hallway  ?Level of Assistance Standby assist, set-up cues, supervision of patient - no hands on  ?Assistive Device Four wheel walker  ?Distance Ambulated (ft) 250 ft  ?Activity Response Tolerated well  ?$Mobility charge 1 Mobility  ? ?Pt eager for mobility session this am. Only requiring x1 seated rest break d/t DOE. No physical assist required, pt left sitting in chair with all needs met. ? ?Nelta Numbers ?Acute Rehab ?Phone: [phone] ?Office Phone: [phone] ? ?

## 2021-12-05 DIAGNOSIS — I5023 Acute on chronic systolic (congestive) heart failure: Secondary | ICD-10-CM | POA: Diagnosis not present

## 2021-12-05 LAB — BASIC METABOLIC PANEL
Anion gap: 8 (ref 5–15)
Anion gap: 9 (ref 5–15)
BUN: 51 mg/dL — ABNORMAL HIGH (ref 8–23)
BUN: 47 mg/dL — ABNORMAL HIGH (ref 8–23)
CO2: 25 mmol/L (ref 22–32)
CO2: 27 mmol/L (ref 22–32)
Calcium: 8.7 mg/dL — ABNORMAL LOW (ref 8.9–10.3)
Calcium: 8.9 mg/dL (ref 8.9–10.3)
Chloride: 106 mmol/L (ref 98–111)
Chloride: 104 mmol/L (ref 98–111)
Creatinine, Ser: 3.34 mg/dL — ABNORMAL HIGH (ref 0.44–1.00)
Creatinine, Ser: 3.27 mg/dL — ABNORMAL HIGH (ref 0.44–1.00)
GFR, Estimated: 15 mL/min — ABNORMAL LOW (ref >60)
GFR, Estimated: 15 mL/min — ABNORMAL LOW (ref >60)
Glucose, Bld: 121 mg/dL — ABNORMAL HIGH (ref 70–99)
Glucose, Bld: 137 mg/dL — ABNORMAL HIGH (ref 70–99)
Potassium: 3.2 mmol/L — ABNORMAL LOW (ref 3.5–5.1)
Potassium: 3.7 mmol/L (ref 3.5–5.1)
Sodium: 139 mmol/L (ref 135–145)
Sodium: 140 mmol/L (ref 135–145)

## 2021-12-05 LAB — GLUCOSE, CAPILLARY
Glucose-Capillary: 148 mg/dL — ABNORMAL HIGH (ref 70–99)
Glucose-Capillary: 126 mg/dL — ABNORMAL HIGH (ref 70–99)

## 2021-12-05 MED ORDER — POTASSIUM CHLORIDE CRYS ER 20 MEQ PO TBCR
40.0000 meq | EXTENDED_RELEASE_TABLET | Freq: Once | ORAL | Status: AC
  Administered 2021-12-05: 40 meq via ORAL
  Filled 2021-12-05: qty 2

## 2021-12-05 MED ORDER — FUROSEMIDE 10 MG/ML IJ SOLN
80.0000 mg | Freq: Once | INTRAMUSCULAR | Status: AC
  Administered 2021-12-05: 80 mg via INTRAVENOUS
  Filled 2021-12-05: qty 8

## 2021-12-05 NOTE — Progress Notes (Signed)
Mobility Specialist Progress Note: ? ? 12/05/21 1530  ?Mobility  ?Activity Ambulated with assistance in hallway  ?Level of Assistance Standby assist, set-up cues, supervision of patient - no hands on  ?Assistive Device Four wheel walker  ?Distance Ambulated (ft) 250 ft  ?Activity Response Tolerated well  ?$Mobility charge 1 Mobility  ? ?Pt agreeable to mobility session this afternoon. Ambulated at supervision level with rollator. Required x2 seated rest breaks d/t fatigue. Pt left sitting EOB with all needs met.  ? ?Nelta Numbers ?Acute Rehab ?Phone: [phone] ?Office Phone: [phone] ? ?

## 2021-12-05 NOTE — Progress Notes (Signed)
OT Cancellation Note ? ?Patient Details ?Name: [name] ?MRN: [id number] ?DOB: 06/12/1956 ? ? ?Cancelled Treatment:    Reason Eval/Treat Not Completed: Patient declined, no reason specified Patient motivated to work with OT, but requesting OT come back after her Lasix has had a few hours to work so she can get the most out of therapy. OT to follow back. ? ?Corinne Ports E. Elenor Wildes, OTR/L ?Acute Rehabilitation Services ?[phone] ?[phone]  ? ?Corinne Ports Elio Haden ?12/05/2021, 9:13 AM ?

## 2021-12-05 NOTE — Progress Notes (Signed)
Occupational Therapy Treatment ?Patient Details ?Name: [name] ?MRN: [id number] ?DOB: 04/11/1956 ?Today's Date: 12/05/2021 ? ? ?History of present illness This 66 y.o. female presenting on 4/3 with SOB and chest pain.  Found with acute on chronic systolic CHF.  Code stroke called 4/10 due to speech difficulties. CT and MRI-negative. 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 ?  ?OT comments ? Patient continues to make steady progress towards goals in skilled OT session. Patient's session encompassed functional mobility, toileting, and reiteration of adaptive equipment for discharge home soon. Patient able to complete toilet transfer and ambulation in room at min guard level, without need for rollator, using furniture minimally to maintain balance. Patient demonstrating good anticipatory awareness with regard to energy conservation, and able to verbally walk through the uses of adaptive equipment. Patient left in chair at end of session with all needs met. Discharge remains appropriate, with goals updated, OT will continue to follow acutely.   ? ?Recommendations for follow up therapy are one component of a multi-disciplinary discharge planning process, led by the attending physician.  Recommendations may be updated based on patient status, additional functional criteria and insurance authorization. ?   ?Follow Up Recommendations ? Home health OT  ?  ?Assistance Recommended at Discharge Frequent or constant Supervision/Assistance  ?Patient can return home with the following ? A little help with walking and/or transfers;A little help with bathing/dressing/bathroom;Assistance with cooking/housework;Direct supervision/assist for medications management;Direct supervision/assist for financial management;Help with stairs or ramp  for entrance;Assist for transportation ?  ?Equipment Recommendations ? None recommended by OT  ?  ?Recommendations for Other Services   ? ?  ?Precautions / Restrictions Precautions ?Precautions: Fall;Other (comment) ?Precaution Comments: watch HR ?Restrictions ?Weight Bearing Restrictions: No  ? ? ?  ? ?Mobility Bed Mobility ?Overal bed mobility: Needs Assistance ?Bed Mobility: Supine to Sit ?  ?  ?Supine to sit: Supervision, HOB elevated ?  ?  ?  ?  ? ?Transfers ?Overall transfer level: Needs assistance ?Equipment used: None ?Transfers: Sit to/from Stand ?Sit to Stand: Min guard ?  ?  ?  ?  ?  ?General transfer comment: able to complete stand pivot and ambulation around room without assistive device, minimal reaching out for surfaces ?  ?  ?Balance Overall balance assessment: Needs assistance ?Sitting-balance support: Feet supported, No upper extremity supported ?Sitting balance-Leahy Scale: Good ?  ?  ?Standing balance support: During functional activity, No upper extremity supported ?Standing balance-Leahy Scale: Fair ?  ?  ?  ?  ?  ?  ?  ?  ?  ?  ?  ?  ?   ? ?ADL either performed or assessed with clinical judgement  ? ?ADL Overall ADL's : Needs assistance/impaired ?  ?  ?Grooming: Wash/dry hands;Wash/dry face;Set up;Sitting ?  ?  ?  ?  ?  ?  ?  ?  ?  ?Toilet Transfer: Min guard;Cueing for safety;Cueing for sequencing ?  ?Toileting- Clothing Manipulation and Hygiene: Min guard;Cueing for safety;Cueing for sequencing;Sit to/from stand;Sitting/lateral lean ?  ?  ?  ?Functional mobility during ADLs: Min guard;Cueing for safety;Cueing for sequencing;Rolling walker (2 wheels) ?General ADL Comments: Patient engaging in toileting, mobility in room, reiteration of AE, and energy conservation in hopes of discharge soon ?  ? ?Extremity/Trunk Assessment   ?  ?  ?  ?  ?  ? ?  Vision   ?  ?  ?Perception   ?  ?Praxis   ?  ? ?Cognition Arousal/Alertness: Awake/alert ?Behavior During Therapy: [facility] for tasks  assessed/performed ?Overall Cognitive Status: Within Functional Limits for tasks assessed ?  ?  ?  ?  ?  ?  ?  ?  ?  ?  ?  ?  ?  ?  ?  ?  ?General Comments: Improved speech in session ?  ?  ?   ?Exercises   ? ?  ?Shoulder Instructions   ? ? ?  ?General Comments    ? ? ?Pertinent Vitals/ Pain       Pain Assessment ?Pain Assessment: No/denies pain ? ?Home Living   ?  ?  ?  ?  ?  ?  ?  ?  ?  ?  ?  ?  ?  ?  ?  ?  ?  ?  ? ?  ?Prior Functioning/Environment    ?  ?  ?  ?   ? ?Frequency ? Min 2X/week  ? ? ? ? ?  ?Progress Toward Goals ? ?OT Goals(current goals can now be found in the care plan section) ? Progress towards OT goals: Progressing toward goals ? ?Acute Rehab OT Goals ?Patient Stated Goal: to go home ?OT Goal Formulation: With patient ?Time For Goal Achievement: 12/20/21 ?Potential to Achieve Goals: Good  ?Plan Discharge plan remains appropriate;Frequency remains appropriate   ? ?Co-evaluation ? ? ?   ?  ?  ?  ?  ? ?  ?AM-PAC OT "6 Clicks" Daily Activity     ?Outcome Measure ? ? Help from another person eating meals?: None ?Help from another person taking care of personal grooming?: None ?Help from another person toileting, which includes using toliet, bedpan, or urinal?: A Little ?Help from another person bathing (including washing, rinsing, drying)?: A Little ?Help from another person to put on and taking off regular upper body clothing?: None ?Help from another person to put on and taking off regular lower body clothing?: A Little ?6 Click Score: 21 ? ?  ?End of Session Equipment Utilized During Treatment: Gait belt ? ?OT Visit Diagnosis: Other abnormalities of gait and mobility (R26.89);Muscle weakness (generalized) (M62.81) ?  ?Activity Tolerance Patient tolerated treatment well ?  ?Patient Left in chair;with call bell/phone within reach ?  ?Nurse Communication Mobility status ?  ? ?   ? ?Time: 1110-1141 ?OT Time Calculation (min): 31 min ? ?Charges: OT General Charges ?$OT Visit: 1 Visit ?OT  Treatments ?$Self Care/Home Management : 23-37 mins ? ?Corinne Ports E. Winford Hehn, OTR/L ?Acute Rehabilitation Services ?[phone] ?[phone]  ? ?Corinne Ports Brittani Purdum ?12/05/2021, 2:04 PM ?

## 2021-12-05 NOTE — Care Management Important Message (Signed)
Important Message ? ?Patient Details  ?Name: [name] ?MRN: [id number] ?Date of Birth: 03-Feb-1956 ? ? ?Medicare Important Message Given:  Yes ? ? ? ? ?Shelda Altes ?12/05/2021, 8:40 AM ?

## 2021-12-05 NOTE — Progress Notes (Signed)
Mobility Specialist Progress Note: ? ? 12/05/21 1000  ?Mobility  ?Activity  ?(bed level exercises)  ?Activity Response Tolerated well  ?$Mobility charge 1 Mobility  ? ?Pt declining all OOB mobility this am d/t being on lasix and not wanting to "have a stream going down the hallway". Pt agreeable to bed level exercises, tolerated well.  ? ?Nelta Numbers ?Acute Rehab ?Phone: [phone] ?Office Phone: [phone] ? ?

## 2021-12-05 NOTE — Progress Notes (Signed)
?PROGRESS NOTE ? ? ? 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 ?-Frequent hospitalizations with CHF, A-fib, recent admission in January with chest pain and shortness of breath, diuresed with Lasix and transition to torsemide 60 twice daily with metolazone 3 days a week.  Presented to the ED 4/3 with chest pain, increased shortness of breath, weight gain. ?-CT chest noted mild pulmonary edema and small pleural effusions, creatinine was 2.1, BNP 534 ?-4/6 right heart cath: Noted severely elevated biventricular pressures, severe RV failure, started on Lasix and milrinone gtt. ?-4/7 underwent DC cardioversion for A-fib ?-4/10: Noted to be encephalopathic, stuttering, this MRI and CT head were negative, seen by neurology, CTA head and neck unremarkable, mentation improving, suspected to be med related ?-Now with AKI on CKD 4 ? ?Subjective: ?-Feels okay, denies any complaints, ambulated in the halls yesterday without dyspnea ? ?Assessment & Plan: ? ?Acute on chronic systolic CHF ?-Echo with EF 30-35%, decreased RV function, severe TR, RVSP of 47 ?-Heart failure team following, right heart cath 4/6 noted PA pressures 67/37, wedge of 30, severely elevated biventricular pressures, moderate PAH, severe RV failure ?-Started on Lasix and milrinone gtt. ?-Diuresed, 17.4 L negative, milrinone discontinued 4/11 ?-Subsequently developed AKI, contrast nephropathy,  ?-Creatinine plateauing, cards following, discharge planning, lasix x1 given today ? ?Atrial fibrillation with RVR ?-Status post DC cardioversion 4/7, also treated with amiodarone this admission, briefly held and then restarted at low-dose ?-Continue apixaban ? ?Valvular Heart  Disease ?-S/p MV and TV repair in 5/22 ?-Echo 04/23: EF 30-35%, RV function moderately reduced, severe TR ?-RHC 4/6 .  Severely elevated biventricular pressures, moderate pulmonary hypertension and severe RV failure ? ?AKI on CKD 4 ?-Baseline creatinine around 2-2.5, creatinine was in the 2.8 range, subsequently trended up to 3.4 ?-suspect cardiorenal, worsened by contrast, following neuro work-up for encephalopathy with CTA neck ?-Avoid hypotension, creatinine plateauing, hopeful for improvement ? ?Toxic encephalopathy ?-Suspected to be secondary to medications primarily gabapentin, Benadryl, hydrocodone, morphine ?-Seen by neurology this admission, MRI brain negative for CVA, CTA head and neck unremarkable as well ? ?Anxiety, depression  ?-Continue buspirone and lower dose of duloxetine ? ?Type 2 diabetes mellitus with hyperlipidemia ([place]) ?-CBGs are stable, continue sliding scale insulin ? ?Acute mastoiditis, left ?Completed 7 days of Augmentin.  ? ?Class 3 obesity ([place]) ?Calculated BMI is 51,9  ? ?DVT prophylaxis: Apixaban ?Code Status: Full code ?Family Communication: Discussed with patient in detail, no family at bedside ?Disposition Plan: Home tomorrow if stable ? ?Consultants:  ?Heart failure team ? ?Procedures:  ? ?Antimicrobials:  ? ? ?Objective: ?Vitals:  ? 12/04/21 2009 12/05/21 1448 12/05/21 0836 12/05/21 0859  ?BP: (!) 136/57 117/64 126/80   ?Pulse: 79 81 79   ?Resp: '17 17 19   '$ ?Temp: 97.8 ?F (36.6 ?C) 98.2 ?F (36.8 ?C)  97.8 ?F (36.6 ?C)  ?TempSrc: Oral Oral Oral Oral  ?SpO2: 100% 98% 100%   ?Weight:  (!) 138.3 kg    ?Height:      ? ? ?Intake/Output Summary (Last 24 hours) at 12/05/2021 1157 ?Last data filed at 12/05/2021 1038 ?Gross per 24 hour  ?Intake 1068 ml  ?Output 3250 ml  ?Net -2182 ml  ? ?Filed Weights  ? 12/03/21 0455 12/04/21 0534 12/05/21 0514  ?Weight: (!) 137.5  kg (!) 138.1 kg (!) 138.3 kg  ? ? ?Examination: ? ?General exam: Obese chronically ill female sitting up in bed, AAO x3, no  deficits ?CVS: S1-S2, regular rhythm ?Lungs: Clear bilaterally ?Abdomen: Soft, obese, nontender, bowel sounds present ?Extremities: No edema, Unna boots on ?Neuro: Moves all extremities, no localizing signs, intermittent stuttering noted  ? ?Data Reviewed:  ? ?CBC: ?Recent Labs  ?Lab 11/29/21 ?0322 11/30/21 ?0346 12/04/21 ?0403  ?WBC 6.8 6.7 6.0  ?HGB 8.5* 8.7* 8.8*  ?HCT 27.6* 28.0* 28.5*  ?MCV 92.3 91.2 91.1  ?PLT 219 237 232  ? ?Basic Metabolic Panel: ?Recent Labs  ?Lab 11/29/21 ?0322 11/30/21 ?0346 12/01/21 ?9924 12/02/21 ?0321 12/03/21 ?2683 12/04/21 ?0403 12/05/21 ?4196  ?NA 138   < > 136 135 140 139 139  ?K 4.6   < > 4.8 4.0 3.9 3.5 3.2*  ?CL 101   < > 101 101 106 104 106  ?CO2 29   < > '27 28 27 27 25  '$ ?GLUCOSE 153*   < > 135* 138* 128* 131* 121*  ?BUN 38*   < > 45* 51* 54* 52* 51*  ?CREATININE 2.78*   < > 3.26* 3.29* 3.33* 3.40* 3.34*  ?CALCIUM 8.9   < > 9.1 8.9 8.8* 8.8* 8.7*  ?MG 2.1  --   --   --   --   --   --   ? < > = values in this interval not displayed.  ? ?GFR: ?Estimated Creatinine Clearance: 24.5 mL/min (A) (by C-G formula based on SCr of 3.34 mg/dL (H)). ?Liver Function Tests: ?No results for input(s): AST, ALT, ALKPHOS, BILITOT, PROT, ALBUMIN in the last 168 hours. ?No results for input(s): LIPASE, AMYLASE in the last 168 hours. ?No results for input(s): AMMONIA in the last 168 hours. ?Coagulation Profile: ?No results for input(s): INR, PROTIME in the last 168 hours. ?Cardiac Enzymes: ?No results for input(s): CKTOTAL, CKMB, CKMBINDEX, TROPONINI in the last 168 hours. ?BNP (last 3 results) ?No results for input(s): PROBNP in the last 8760 hours. ?HbA1C: ?No results for input(s): HGBA1C in the last 72 hours. ?CBG: ?Recent Labs  ?Lab 12/04/21 ?1617  ?GLUCAP 92  ? ?Lipid Profile: ?No results for input(s): CHOL, HDL, LDLCALC, TRIG, CHOLHDL, LDLDIRECT in the last 72 hours. ?Thyroid Function Tests: ?No results for input(s): TSH, T4TOTAL, FREET4, T3FREE, THYROIDAB in the last 72 hours. ?Anemia  Panel: ?No results for input(s): VITAMINB12, FOLATE, FERRITIN, TIBC, IRON, RETICCTPCT in the last 72 hours. ?Urine analysis: ?   ?Component Value Date/Time  ? [place] YELLOW 11/21/2021 1926  ? APPEARANCEUR CLEAR 11/21/2021 1926  ? LABSPEC 1.008 11/21/2021 1926  ? PHURINE 5.0 11/21/2021 1926  ? GLUCOSEU NEGATIVE 11/21/2021 1926  ? HGBUR SMALL (A) 11/21/2021 1926  ? [place] NEGATIVE 11/21/2021 1926  ? BILIRUBINUR negative 09/24/2019 1742  ? Benjamin Stain NEGATIVE 11/21/2021 1926  ? PROTEINUR 30 (A) 11/21/2021 1926  ? UROBILINOGEN 1.0 09/24/2019 1742  ? UROBILINOGEN 1.0 10/26/2014 0249  ? NITRITE NEGATIVE 11/21/2021 1926  ? LEUKOCYTESUR NEGATIVE 11/21/2021 1926  ? ?Sepsis Labs: ?'@LABRCNTIP'$ (procalcitonin:4,lacticidven:4) ? ?)No results found for this or any previous visit (from the past 240 hour(s)).  ? ?Radiology Studies: ?No results found. ? ? ?Scheduled Meds: ? amiodarone  200 mg Oral Daily  ? apixaban  5 mg Oral BID  ? busPIRone  5 mg Oral BID  ? colchicine  0.3 mg Oral Daily  ? DULoxetine  30 mg Oral Daily  ? metoprolol succinate  25 mg Oral Daily  ?  multivitamin with minerals  1 tablet Oral Daily  ? pantoprazole  40 mg Oral Daily  ? potassium chloride  40 mEq Oral Once  ? rosuvastatin  10 mg Oral QPM  ? sodium chloride flush  3 mL Intravenous Q12H  ? sodium chloride flush  3 mL Intravenous Q12H  ? sodium chloride flush  3 mL Intravenous Q12H  ? ?Continuous Infusions: ? sodium chloride    ? sodium chloride    ? ? ? LOS: 13 days  ? ? ?Time spent: 65mn ? ?PDomenic Polite MD ?[facility] ? ? ?12/05/2021, 11:57 AM  ?  ?

## 2021-12-05 NOTE — Progress Notes (Signed)
PT Cancellation Note ? ?Patient Details ?Name: [name] ?MRN: [id number] ?DOB: Feb 13, 1956 ? ? ?Cancelled Treatment:    Reason Eval/Treat Not Completed: Patient declined, no reason specified (Pt declines at this time.  States she just had lasix and needs to stay hooked up to purewick due to inc urination.  Requests that PT f/u after 11 am.  PT to f/u as able.) ? ?Mylissa Lambe A. Nashla Althoff, PT, DPT ?Acute Rehabilitation Services ?Office: [phone]  ?[name] ?12/05/2021, 9:44 AM ?

## 2021-12-05 NOTE — Progress Notes (Addendum)
? ? Advanced Heart Failure Rounding Note ? ?PCP-Cardiologist: Buford Dresser, MD  ? ?Subjective:   ? ?04/03: Admitted with A fib RVR and A/C HFrEF.  Started on amio drip. Diuresing with IV lasix.   ?04/06: RHC with severely elevated biventricular pressures. Severe RV failure. Started milrinone 0.125 + lasix gtt  ?04/07: S/p DCCV, Afib >>>NSR ?04/10: AMS . Code stroke called. MRI/Head CT negative. ? Related to amio. Diuretics stopped.  ? ?Yesterday given 80 mg IV lasix. Negative 1 liter.  ? ?Creatinine trending up 2.45>2.6 (contrast) >2.8 >2.9>3.26>>3.29>>3.34 ? ?Feels ok. No longer stuttering.  ? ? ?Objective:   ?Weight Range: ?(!) 138.3 kg ?Body mass index is 47.77 kg/m?.  ? ?Vital Signs:   ?Temp:  [97.8 ?F (36.6 ?C)-98.2 ?F (36.8 ?C)] 98.2 ?F (36.8 ?C) (04/17 2202) ?Pulse Rate:  [79-81] 81 (04/17 0514) ?Resp:  [17] 17 (04/17 0514) ?BP: (115-136)/(57-64) 117/64 (04/17 0514) ?SpO2:  [98 %-100 %] 98 % (04/17 0514) ?Weight:  [138.3 kg] 138.3 kg (04/17 0514) ?Last BM Date : 12/04/21 ? ?Weight change: ?Filed Weights  ? 12/03/21 0455 12/04/21 0534 12/05/21 0514  ?Weight: (!) 137.5 kg (!) 138.1 kg (!) 138.3 kg  ? ? ?Intake/Output:  ? ?Intake/Output Summary (Last 24 hours) at 12/05/2021 0742 ?Last data filed at 12/05/2021 0526 ?Gross per 24 hour  ?Intake 946 ml  ?Output 2000 ml  ?Net -1054 ml  ?  ? ? ?Physical Exam  ?General: No resp difficulty ?HEENT: normal ?Neck: supple. JVP 6-7 . Carotids 2+ bilat; no bruits. No lymphadenopathy or thryomegaly appreciated. ?Cor: PMI nondisplaced. Regular rate & rhythm. No rubs, gallops or murmurs. ?Lungs: clear ?Abdomen: obese. Soft, nontender, nondistended. No hepatosplenomegaly. No bruits or masses. Good bowel sounds. ?Extremities: no cyanosis, clubbing, rash, edema ?Neuro: alert & orientedx3, cranial nerves grossly intact. moves all 4 extremities w/o difficulty. Affect pleasant ? ? ?Telemetry  ? ? ?SR 70-80s personally checked.  ? ?Labs  ?  ?CBC ?Recent Labs  ?  12/04/21 ?0403   ?WBC 6.0  ?HGB 8.8*  ?HCT 28.5*  ?MCV 91.1  ?PLT 232  ? ?Basic Metabolic Panel ?Recent Labs  ?  12/04/21 ?0403 12/05/21 ?0218  ?NA 139 139  ?K 3.5 3.2*  ?CL 104 106  ?CO2 27 25  ?GLUCOSE 131* 121*  ?BUN 52* 51*  ?CREATININE 3.40* 3.34*  ?CALCIUM 8.8* 8.7*  ? ?Liver Function Tests ?No results for input(s): AST, ALT, ALKPHOS, BILITOT, PROT, ALBUMIN in the last 72 hours. ? ?No results for input(s): LIPASE, AMYLASE in the last 72 hours. ? ?Cardiac Enzymes ?No results for input(s): CKTOTAL, CKMB, CKMBINDEX, TROPONINI in the last 72 hours. ? ? ?BNP: ?BNP (last 3 results) ?Recent Labs  ?  08/31/21 ?0114 09/23/21 ?1554 11/21/21 ?1122  ?BNP 132.7* 31.1 534.8*  ? ? ?ProBNP (last 3 results) ?No results for input(s): PROBNP in the last 8760 hours. ? ? ?D-Dimer ?No results for input(s): DDIMER in the last 72 hours. ? ?Hemoglobin A1C ?No results for input(s): HGBA1C in the last 72 hours. ? ?Fasting Lipid Panel ?No results for input(s): CHOL, HDL, LDLCALC, TRIG, CHOLHDL, LDLDIRECT in the last 72 hours. ?Thyroid Function Tests ?No results for input(s): TSH, T4TOTAL, T3FREE, THYROIDAB in the last 72 hours. ? ?Invalid input(s): FREET3 ? ? ?Other results: ? ? ?Imaging  ? ? ?No results found. ? ? ?Medications:   ? ? ?Scheduled Medications: ? amiodarone  200 mg Oral Daily  ? apixaban  5 mg Oral BID  ? busPIRone  5 mg  Oral BID  ? colchicine  0.3 mg Oral Daily  ? DULoxetine  30 mg Oral Daily  ? metoprolol succinate  25 mg Oral Daily  ? multivitamin with minerals  1 tablet Oral Daily  ? pantoprazole  40 mg Oral Daily  ? potassium chloride  40 mEq Oral Once  ? rosuvastatin  10 mg Oral QPM  ? sodium chloride flush  3 mL Intravenous Q12H  ? sodium chloride flush  3 mL Intravenous Q12H  ? sodium chloride flush  3 mL Intravenous Q12H  ? ? ?Infusions: ? sodium chloride    ? sodium chloride    ? ? ?PRN Medications: ?sodium chloride, sodium chloride, acetaminophen **OR** acetaminophen, albuterol, sodium chloride flush, sodium chloride  flush ? ? ? ?Patient Profile  ?66 y.o. female with history of morbid obesity, chronic diastolic and systolic HF with mildly reduced LVEF (45-50%), severe MR/TR, atrial fibrillation on Eliquis, HTN, and DMII.  ? ?Admitted with A Fib RVR and A/C HFrEF.  ? ? ? ?Assessment/Plan  ?1. A fib RVR ?- She has failed Tikosyn ?- Maze in 5/22 and DCCV in 6/22 & 04/19/21, last of which she had ERAF. ?- Saw Dr Quentin Ore in February, discussed AV nodal ablation + CRT if recurrent atrial arrhythmias ?- S/p successful DCCV on 04/07.  ?- Amio gtt switched to po amio 400 mg BID on 04/08.  ?- Initially stopped amio d/t concern contributing to neuro issus. Amio restarted 04/12. Do not feel amio contributing stuttering. Continue amio 200 mg daily .  ?- if she goes in A fib again ? Rate control. Maintaining SR.  ?- Continue Eliquis 5 mg BID. ?  ?2. A/C HFrEF,NICM ?- RHC (5/22): elevated R>L filling pressures with PAPi low, suggestive of significant component of RV dysfunction, prominent v-waves in PCWP and RA tracings suggestive of MR/TR, CO low ?- RHC (6/22) with low filling pressures with moderately reduced CO. ?- RHC 07/2021 normal filling pressures and moderately reduced cardiac output.  ?- Echo (7/22) in setting of AFL with RVR, showed EF 30-35%, moderate LVH, moderate RV dysfunction, PASP 48, s/p MV repair with mild MR and mean gradient 10 mmHg with HR around 130, s/p TV repair with mild-moderate TR, dilated IVC.  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  ?- Echo 11/22/21: EF 30-35% RV moderately reduced, RVSP 47 mmHg, severe BAE, mild MS, severe TR despite TV repair ?- RHC 11/24/21: RA 29, PA 67/37 (45), PCW 30 ( v waves to 45), Fick CO/CI 6.2/2.5, PA sat 50%, 55%, PAPi 1.0. Severely elevated biventricular pressures, moderate pulmonary hypertension with marked V waves in PCWP tracing and severe RV failure. ?- Suspect AF contributing to decompensation. S/p successful DCCV on 04/07 as above. ?- Milrinone stopped 4/11.   ?- Volume status look  ok. Hold diuretics with AKI. Tomorrow can start torsemide 60 mg daily.  ?- GDMT limited by AKI on CKD, eventually consider spiro and SGLT2i ?- Continue toprol xl 25 mg daily ?- Continue unna boots.  ? ?3. PVCs ?- Improved off milrinone ?< 10 per hour.  ? ?4. Valvular Heart Disease ?-S/p MV and TV repair in 5/22 ?-Echo 04/23: EF 30-35%, RV moderately reduced, severe TR ?-RHC 4/6 .  Severely elevated biventricular pressures, moderate pulmonary hypertension and severe RV failure. PAPi 1.0. ?-Not sure if there are any options for repair. Need to allow for renal recovery at this point. ? ?5. CAD/Chest pain ?- trivial CAD on heart cath 2019 ?- No chest pain.  ?- hstrop negative. Doubt  ischemic ?  ?6. CKD Stage IV ?- baseline Scr 2-2.5 ?- Scr 2.09>2.46.2.63>2.54>2.45>2.60>2.8 >>2.9>>3.26>>3.29>>3.4 ?- Hold diuretics.  ?  ?7. DMII ?- per primary  ?- consider SGLT2i as renal function stabilizes ?  ?8. Morbid Obesity  ?- needs weight loss ?- May be candidate for GLP1RA ? ?9. Anemia ?- Baseline 10-12, Hgb 8.8 on 4/16  4/12 ?- No bleeding issues.  ?- Iron stores low. Consider feraheme once treatment for mastoiditis complete. ? ?10. Confusion/slurred speech/tremor. Stuttering ?- 4/10 Code stroke activated.Neurology consulted.  ?-  MRI - no acute findings. CT unrevealing.  ?- Has not missed any doses of Eliquis since admission. ?- ? Amiodarone contributing to tremor. Amio stopped but added back 04/12 to maintain sinus rhythm. Doubt contributing to stutter. Uncertain etiology. ?- Resolved.   ?- Medications including narcotics, benadryl and duloxetine adjusted by primary team ? ?Disposition: ?PT/OT suggested [facility] PT/OT.  ? ? ?CIR is not an option at her functional level. ? SNF versus HH.  ?  ?Length of Stay: 13 ? ?Darrick Grinder, NP  ?12/05/2021, 7:42 AM ? ?Advanced Heart Failure Team ?Pager [phone] (M-F; 7a - 5p)  ?Please contact [facility] for night-coverage after hours (5p -7a ) and weekends on [email or web address] ? ?Patient seen and  examined with the above-signed Advanced Practice Provider and/or Housestaff. I personally reviewed laboratory data, imaging studies and relevant notes. I independently examined the patient and formulated the importan

## 2021-12-06 LAB — BASIC METABOLIC PANEL
Anion gap: 10 (ref 5–15)
BUN: 49 mg/dL — ABNORMAL HIGH (ref 8–23)
CO2: 27 mmol/L (ref 22–32)
Calcium: 9 mg/dL (ref 8.9–10.3)
Chloride: 103 mmol/L (ref 98–111)
Creatinine, Ser: 3.12 mg/dL — ABNORMAL HIGH (ref 0.44–1.00)
GFR, Estimated: 16 mL/min — ABNORMAL LOW (ref >60)
Glucose, Bld: 109 mg/dL — ABNORMAL HIGH (ref 70–99)
Potassium: 3.7 mmol/L (ref 3.5–5.1)
Sodium: 140 mmol/L (ref 135–145)

## 2021-12-06 LAB — GLUCOSE, CAPILLARY
Glucose-Capillary: 148 mg/dL — ABNORMAL HIGH (ref 70–99)
Glucose-Capillary: 105 mg/dL — ABNORMAL HIGH (ref 70–99)
Glucose-Capillary: 170 mg/dL — ABNORMAL HIGH (ref 70–99)

## 2021-12-06 MED ORDER — METOLAZONE 2.5 MG PO TABS
2.5000 mg | ORAL_TABLET | Freq: Every day | ORAL | Status: DC
  Administered 2021-12-06 – 2021-12-07 (×2): 2.5 mg via ORAL
  Filled 2021-12-06 (×2): qty 1

## 2021-12-06 MED ORDER — FUROSEMIDE 10 MG/ML IJ SOLN
80.0000 mg | Freq: Two times a day (BID) | INTRAMUSCULAR | Status: DC
  Administered 2021-12-06 – 2021-12-07 (×3): 80 mg via INTRAVENOUS
  Filled 2021-12-06 (×3): qty 8

## 2021-12-06 NOTE — Progress Notes (Signed)
?  12/06/21 1420  ?Clinical Encounter Type  ?Visited With Patient  ?Visit Type Initial ?(Advance Directive Education)  ?Referral From Nurse ?Rolm Baptise, RN)  ?Consult/Referral To Chaplain ?Albertina Parr [place])  ?Spiritual Encounters  ?Spiritual Needs Prayer  ? ?Chaplain Melvenia Beam met with [name] at bedside regarding the completion of Advance Directive. Patient acknowledged that preparing A.D. was something important that she would like to have completed. Chaplain explained that A.D. is only used when the patient cannot make medical decisions about medical care for herself, giving instruction to the doctors and her agents of how she wishes to be cared for.  ? ?Patient stated after careful thought that she plans to name her daughters as her Medical Power of Attorney.   ? ?Chaplain explained the importance of having conversation with her daughters prior to naming them as her agent.   ? ?Chaplain provided patient a blank copy and provided patient instruction for the completion of Parts A and B of the A.D. Chaplain instructed patient to have the nurse contact [place] [facility] when she has completed Parts A and B, so that we can arrange for Notary and witnesses to assist with completion of Part C.   ? ? Patient was appreciative of the consultation. Chaplain will follow up with patient to arrange for witnesses and Notary assistance.  ?[place], M. [place]. may be contacted at [phone] for further assistance.    ?

## 2021-12-06 NOTE — Progress Notes (Signed)
Mobility Specialist Progress Note: ? ? 12/06/21 1010  ?Mobility  ?Activity  ?(bed level exercises)  ?Range of Motion/Exercises Active;Right leg;Left leg  ?Level of Assistance Independent  ?Assistive Device None  ?Activity Response Tolerated well  ?$Mobility charge 1 Mobility  ? ?Pt requesting to ambulate later d/t just receiving lasix. Agreed to bed level exercises. Pt tolerated well. ? ?Nelta Numbers ?Acute Rehab ?Phone: [phone] ?Office Phone: [phone] ? ?

## 2021-12-06 NOTE — Progress Notes (Signed)
Nutrition Follow-up ? ?DOCUMENTATION CODES:  ? ?Morbid obesity ? ?INTERVENTION:  ? ?Continue Multivitamin w/ minerals daily ?Continue Snacks ?Double Protein with all meals  ?Change diet to 2 gm NA diet with fluid restriction ?Diet education   ? ?NUTRITION DIAGNOSIS:  ? ?Increased nutrient needs related to acute illness as evidenced by estimated needs. - Ongoing  ? ?GOAL:  ? ?Patient will meet greater than or equal to 90% of their needs - Ongoing  ? ?MONITOR:  ? ?PO intake, Labs, I & O's, Weight trends ? ?REASON FOR ASSESSMENT:  ? ?Consult ?Diet education ? ?ASSESSMENT:  ? ?Pt admitted with SOB secondary to acute on chronic CHF. PMH significant for T2DM, obesity, afib on Eliquid, CHF, DKA, COPD and CKD stage IV. ? ?Pt resting in bed. Pt reports that she has been doing well, but has had multiple issues with quality of food. This has impacted her eating a bit. Pt reports that she di not receive her snack yesterday, but other than that she has. Reports that double proteins on trays have helped with still feeling hungry. Pt reports that at home she drinks lemonade with all meals, then cranberry juice, water, [place], and Ginger Ale. Pt reports that she does not drink diet beverages as she does not like them. Pt requesting to have lemonade with meals here.  ?Per EMR, pt intake includes: ?4/13: Dinner 60% ?4/14: Breakfast 100% ?4/15: Breakfast 50% ?4/16: Breakfast 50%, Lunch 100% ?4/17: Breakfast 25%, Lunch 100%, Dinner 50% ? ?RD added "Heart Failure Nutrition Therapy" to discharge instructions for pt. ? ?Medications reviewed and include: Lasix, Metolazone, MVI, Protonix ?Labs reviewed: BUN 49, Creatinine 3.12, 24 hr CBG 126-148 ? ?UOP: 2500 mL + 1 unmeasured occurrence x 24 hr ?I/O: -20132 mL since admit  ? ?Diet Order:   ?Diet Order   ? ?       ?  Diet Carb Modified Fluid consistency: Thin; Room service appropriate? Yes; Fluid restriction: 1200 mL Fluid  Diet effective now       ?  ? ?  ?  ? ?  ? ? ?EDUCATION  NEEDS:  ? ?Education needs have been addressed ? ?Skin:  Skin Assessment: Reviewed RN Assessment ? ?Last BM:  4/16 ? ?Height:  ? ?Ht Readings from Last 1 Encounters:  ?11/22/21 '5\' 7"'$  (1.702 m)  ? ? ?Weight:  ? ?Wt Readings from Last 1 Encounters:  ?12/06/21 (!) 138.5 kg  ? ? ?Ideal Body Weight:  61.4 kg ? ?BMI:  Body mass index is 47.83 kg/m?. ? ?Estimated Nutritional Needs:  ? ?Kcal:  2000-2200 ? ?Protein:  100-115 grams ? ?Fluid:  >/= 2 L ? ? ? ?Hermina Barters RD, LDN ?Clinical Dietitian ?See AMiON for contact information.  ? ?

## 2021-12-06 NOTE — Progress Notes (Addendum)
Physical Therapy Treatment ?Patient Details ?Name: [name] ?MRN: [id number] ?DOB: 20-Jan-1956 ?Today's Date: 12/06/2021 ? ? ?History of Present Illness This 66 y.o. female presenting on 4/3 with SOB and chest pain.  Found with acute on chronic systolic CHF.  Code stroke called 4/10 due to speech difficulties. CT and MRI-negative. 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 ? ?  ?PT Comments  ? Pt received in supine agreeable to therapy session with encouragement and emphasis on bed mobility exercises and transfer training. Pt needing minA cues for proper technique during bed exercises. Pt was able to sit to stand with encouragement and able to march in place with min guard assist and verbal cues for proper body mechanics. She needed to take frequent breaks throughout the session due to feeling lethargic. Educated patient on the importance of staying active and continuing to do exercises to increase her core strength for balance and strengthening her LE's to walk. Plan for next session is to focus on gait training. Pt continues to benefit from PT services to progress toward functional mobility goals.                                                                                                                                                                                                                                                                                                                                                                                                                                                                                                                                                                                                                                                                                                                                                                                                                                                                                                                                                                                                                                                                                                                                                                                                                                                                                                                                                                                                                                                                                                                                                                                                                                                                                                                                                                                                                                                                                                                                                                                                                                                                                                                                                                                                                                                                                                                                                                               ?  Recommendations for follow up therapy are one component of a multi-disciplinary discharge planning process, led by the attending physician.  Recommendations may be updated based on patient status, additional functional criteria and insurance authorization. ? ?Follow Up Recommendations ? Home health  PT ?  ?  ?Assistance Recommended at Discharge Intermittent Supervision/Assistance  ?Patient can return home with the following A little help with bathing/dressing/bathroom;Help with stairs or ramp for entrance;Assist for transportation;Assistance with cooking/housework;A little help with walking and/or transfers ?  ?Equipment Recommendations ? None recommended by PT  ?  ?   ?Precautions / Restrictions Precautions ?Precautions: Fall;Other (comment) ?Precaution Comments: watch HR ?Restrictions ?Weight Bearing Restrictions: No  ?  ? ?Mobility ? Bed Mobility ?Overal bed mobility: Needs Assistance ?Bed Mobility: Supine to Sit ?Rolling: Supervision ?  ?Supine to sit: Supervision, HOB elevated ?Sit to supine: Min guard ?Sit to sidelying: Min assist ?  ?  ? ?Transfers ?Overall transfer level: Needs assistance ?Equipment used: Rolling walker (2 wheels) ?Transfers: Sit to/from Stand ?Sit to Stand: Min guard ?  ?  ?  ?  ?  ? Comments: Pt stood x2 reps with max encouragement from bed, cues for UE placement, standing hip flexion x5 reps ?  ? ? ? ? ?  ?Balance Overall balance assessment: Needs assistance ?Sitting-balance support: Feet supported, No upper extremity supported ?Sitting balance-Leahy Scale: Good ?Sitting balance - Comments: total A to donn socks ?  ?Standing balance support: During functional activity, No upper extremity supported ?Standing balance-Leahy Scale: Fair ?Standing balance comment: min guard during ADLs ?  ?  ? ?  ?Cognition Arousal/Alertness: Awake/alert ?Behavior During Therapy: [facility] for tasks assessed/performed ?Overall Cognitive Status: Within Functional Limits for tasks assessed ?  ?  ? ?  ?Exercises General Exercises - Lower Extremity ?Ankle Circles/Pumps: AROM, Both, 10 reps, Supine, 5 reps, Seated ?Heel Slides: AROM, Both, 10 reps, Supine (cues for proper technique - keeping toes pointed up) ?Hip ABduction/ADduction: AROM, Both, 10 reps, Supine ?Hip Flexion/Marching: AROM, Both, 10 reps, Seated, 5  reps, Standing ?Other Exercises ?Other Exercises: lateral leans with elbow taps both x10 reps ? ?  ?   ? ?Pertinent Vitals/Pain Pain Assessment ?Pain Assessment: No/denies pain ?Pain Score: 0-No pain  ? ? ?   ?   ? ?PT Goals (current goals can now be found in the care plan section) Acute Rehab PT Goals ?Patient Stated Goal: to feel better ?PT Goal Formulation: With patient ?Time For Goal Achievement: 12/06/21 ?Potential to Achieve Goals: Good ?Progress towards PT goals: Progressing toward goals ? ?  ?Frequency ? ? ? Min 3X/week ? ? ? ?  ?PT Plan Current plan remains appropriate  ? ? ?   ?AM-PAC PT "6 Clicks" Mobility   ?Outcome Measure ? Help needed turning from your back to your side while in a flat bed without using bedrails?: A Little ?Help needed moving from lying on your back to sitting on the side of a flat bed without using bedrails?: A Little ?Help needed moving to and from a bed to a chair (including a wheelchair)?: A Little ?Help needed standing up from a chair using your arms (e.g., wheelchair or bedside chair)?: A Little ?Help needed to walk in hospital room?: A Little ?Help needed climbing 3-5 steps with a railing? : A Lot ?6 Click Score: 17 ? ?  ?End of Session Equipment Utilized During Treatment: Gait belt ?Activity Tolerance: Patient tolerated treatment well;Patient limited by fatigue ?Patient left: in bed;with call bell/phone within reach;with bed alarm set ?  Nurse Communication: Mobility status ?  ?  ? ? ?Time: 0258-5277 ?PT Time Calculation (min) (ACUTE ONLY): 27 min ? ?Charges:  $Therapeutic Exercise: 8-22 mins ?$Therapeutic Activity: 8-22 mins          ?          ?Wadie Lessen, SPTA ? ? ? ?Tiffany Mutch ?12/06/2021, 3:19 PM ? ?Carly P., PTA ?Acute Rehabilitation Services ?Secure Chat Preferred 9a-5:30pm ?Office: [phone]  ? ?

## 2021-12-06 NOTE — Progress Notes (Signed)
?PROGRESS NOTE ? ? ? ?[name]  ZOX:[id number] DOB: 06-01-1956 DOA: 11/21/2021 ?PCP: [name], NP  ?Narrative 65/female with history of morbid obesity, chronic systolic and diastolic CHF, EF 81%, severe MR, TR status post mitral and tricuspid valve replacement, paroxysmal A-fib on Eliquis, type 2 diabetes mellitus, hypertension. ?-Frequent hospitalizations with CHF, A-fib, recent admission in January with chest pain and shortness of breath, diuresed with Lasix and transition to torsemide 60 twice daily with metolazone 3 days a week.  Presented to the ED 4/3 with chest pain, increased shortness of breath, weight gain. ?-CT chest noted mild pulmonary edema and small pleural effusions, creatinine was 2.1, BNP 534 ?-4/6 right heart cath: Noted severely elevated biventricular pressures, severe RV failure, started on Lasix and milrinone gtt. ?-4/7 underwent DC cardioversion for A-fib ?-4/10: Noted to be encephalopathic, stuttering, this MRI and CT head were negative, seen by neurology, CTA head and neck unremarkable, mentation improving, suspected to be med related ?-Then developed AKI on CKD 4 ? ?Subjective: ?-Feels well, ambulating in the halls, wants to go home ?-Drink 4 cups of juice this morning, multiple cups of water/ice alongside ? ?Assessment & Plan: ? ?Acute on chronic systolic CHF ?-Echo with EF 30-35%, decreased RV function, severe TR, RVSP of 47 ?-Heart failure team following, right heart cath 4/6 noted PA pressures 67/37, wedge of 30, severely elevated biventricular pressures, moderate PAH, severe RV failure ?-Started on Lasix and milrinone gtt. ?-Diuresed, 20.1 L negative, milrinone discontinued 4/11 ?-Subsequently developed AKI, contrast nephropathy,  ?-Creatinine improving, redosed with IV Lasix today with metolazone ?-Continue Toprol ?-Fluid restriction emphasized, RN updated ?-Discharge planning, will need close follow-up ? ?Atrial fibrillation with RVR ?-Status post DC cardioversion  4/7, also treated with amiodarone this admission, briefly held and then restarted at low-dose ?-Continue apixaban ? ?Valvular Heart Disease ?-S/p MV and TV repair in 5/22 ?-Echo 04/23: EF 30-35%, RV function moderately reduced, severe TR ?-RHC 4/6 .  Severely elevated biventricular pressures, moderate pulmonary hypertension and severe RV failure ? ?AKI on CKD 4 ?-Baseline creatinine around 2-2.5, creatinine was in the 2.8 range, subsequently trended up to 3.4 ?-suspect cardiorenal, worsened by contrast, following neuro work-up for encephalopathy with CTA neck ?-Avoid hypotension, creatinine plateauing, hopeful for improvement ? ?Toxic encephalopathy ?-Suspected to be secondary to medications primarily gabapentin, Benadryl, hydrocodone, morphine ?-Seen by neurology this admission, MRI brain negative for CVA, CTA head and neck unremarkable as well ? ?Anxiety, depression  ?-Continue buspirone and lower dose of duloxetine ? ?Type 2 diabetes mellitus with hyperlipidemia ([place]) ?-CBGs are stable, continue sliding scale insulin ? ?Acute mastoiditis, left ?Completed 7 days of Augmentin.  ? ?Class 3 obesity ([place]) ?Calculated BMI is 51,9  ? ?DVT prophylaxis: Apixaban ?Code Status: Full code ?Family Communication: Discussed with patient in detail, no family at bedside ?Disposition Plan: Home tomorrow if stable ? ?Consultants:  ?Heart failure team ? ?Procedures:  ? ?Antimicrobials:  ? ? ?Objective: ?Vitals:  ? 12/06/21 0100 12/06/21 0341 12/06/21 0907 12/06/21 1134  ?BP:  131/73 120/64 122/79  ?Pulse:  81  77  ?Resp:  '18 16 18  '$ ?Temp:  98.1 ?F (36.7 ?C)    ?TempSrc:  Oral  Oral  ?SpO2:  95%  100%  ?Weight: (!) 138.5 kg     ?Height:      ? ? ?Intake/Output Summary (Last 24 hours) at 12/06/2021 1150 ?Last data filed at 12/06/2021 1135 ?Gross per 24 hour  ?Intake 1140 ml  ?Output 2900 ml  ?Net -1760 ml  ? ?  Filed Weights  ? 12/04/21 0534 12/05/21 0514 12/06/21 0100  ?Weight: (!) 138.1 kg (!) 138.3 kg (!) 138.5 kg   ? ? ?Examination: ? ?General exam: Obese chronically ill female sitting up in bed, AAO x3, no deficits ?CVS: S1-S2, regular rhythm ?Lungs: Clear bilaterally ?Abdomen: Soft, obese, nontender, bowel sounds present ?Extremities: No edema, Unna boots on ?Neuro: Moves all extremities, no localizing signs, intermittent stuttering noted  ? ?Data Reviewed:  ? ?CBC: ?Recent Labs  ?Lab 11/30/21 ?0346 12/04/21 ?0403  ?WBC 6.7 6.0  ?HGB 8.7* 8.8*  ?HCT 28.0* 28.5*  ?MCV 91.2 91.1  ?PLT 237 232  ? ?Basic Metabolic Panel: ?Recent Labs  ?Lab 12/03/21 ?6503 12/04/21 ?0403 12/05/21 ?0218 12/05/21 ?1327 12/06/21 ?0114  ?NA 140 139 139 140 140  ?K 3.9 3.5 3.2* 3.7 3.7  ?CL 106 104 106 104 103  ?CO2 '27 27 25 27 27  '$ ?GLUCOSE 128* 131* 121* 137* 109*  ?BUN 54* 52* 51* 47* 49*  ?CREATININE 3.33* 3.40* 3.34* 3.27* 3.12*  ?CALCIUM 8.8* 8.8* 8.7* 8.9 9.0  ? ?GFR: ?Estimated Creatinine Clearance: 26.2 mL/min (A) (by C-G formula based on SCr of 3.12 mg/dL (H)). ?Liver Function Tests: ?No results for input(s): AST, ALT, ALKPHOS, BILITOT, PROT, ALBUMIN in the last 168 hours. ?No results for input(s): LIPASE, AMYLASE in the last 168 hours. ?No results for input(s): AMMONIA in the last 168 hours. ?Coagulation Profile: ?No results for input(s): INR, PROTIME in the last 168 hours. ?Cardiac Enzymes: ?No results for input(s): CKTOTAL, CKMB, CKMBINDEX, TROPONINI in the last 168 hours. ?BNP (last 3 results) ?No results for input(s): PROBNP in the last 8760 hours. ?HbA1C: ?No results for input(s): HGBA1C in the last 72 hours. ?CBG: ?Recent Labs  ?Lab 12/04/21 ?1617 12/05/21 ?1423 12/05/21 ?1633 12/06/21 ?1132  ?GLUCAP 92 148* 126* 148*  ? ?Lipid Profile: ?No results for input(s): CHOL, HDL, LDLCALC, TRIG, CHOLHDL, LDLDIRECT in the last 72 hours. ?Thyroid Function Tests: ?No results for input(s): TSH, T4TOTAL, FREET4, T3FREE, THYROIDAB in the last 72 hours. ?Anemia Panel: ?No results for input(s): VITAMINB12, FOLATE, FERRITIN, TIBC, IRON, RETICCTPCT in  the last 72 hours. ?Urine analysis: ?   ?Component Value Date/Time  ? [place] YELLOW 11/21/2021 1926  ? APPEARANCEUR CLEAR 11/21/2021 1926  ? LABSPEC 1.008 11/21/2021 1926  ? PHURINE 5.0 11/21/2021 1926  ? GLUCOSEU NEGATIVE 11/21/2021 1926  ? HGBUR SMALL (A) 11/21/2021 1926  ? [place] NEGATIVE 11/21/2021 1926  ? BILIRUBINUR negative 09/24/2019 1742  ? [place] Stain NEGATIVE 11/21/2021 1926  ? PROTEINUR 30 (A) 11/21/2021 1926  ? UROBILINOGEN 1.0 09/24/2019 1742  ? UROBILINOGEN 1.0 10/26/2014 0249  ? NITRITE NEGATIVE 11/21/2021 1926  ? LEUKOCYTESUR NEGATIVE 11/21/2021 1926  ? ?Sepsis Labs: ?'@LABRCNTIP'$ (procalcitonin:4,lacticidven:4) ? ?)No results found for this or any previous visit (from the past 240 hour(s)).  ? ?Radiology Studies: ?No results found. ? ? ?Scheduled Meds: ? amiodarone  200 mg Oral Daily  ? apixaban  5 mg Oral BID  ? busPIRone  5 mg Oral BID  ? colchicine  0.3 mg Oral Daily  ? DULoxetine  30 mg Oral Daily  ? furosemide  80 mg Intravenous BID  ? metolazone  2.5 mg Oral Daily  ? metoprolol succinate  25 mg Oral Daily  ? multivitamin with minerals  1 tablet Oral Daily  ? pantoprazole  40 mg Oral Daily  ? rosuvastatin  10 mg Oral QPM  ? sodium chloride flush  3 mL Intravenous Q12H  ? sodium chloride flush  3 mL Intravenous Q12H  ?  sodium chloride flush  3 mL Intravenous Q12H  ? ?Continuous Infusions: ? sodium chloride    ? sodium chloride    ? ? ? LOS: 14 days  ? ? ?Time spent: 83mn ? ?PDomenic Polite MD ?[facility] ? ? ?12/06/2021, 11:50 AM  ?  ?

## 2021-12-06 NOTE — Progress Notes (Addendum)
? ? Advanced Heart Failure Rounding Note ? ?PCP-Cardiologist: Buford Dresser, MD  ? ?Subjective:   ? ?04/03: Admitted with A fib RVR and A/C HFrEF.  Started on amio drip. Diuresing with IV lasix.   ?04/06: RHC with severely elevated biventricular pressures. Severe RV failure. Started milrinone 0.125 + lasix gtt  ?04/07: S/p DCCV, Afib >>>NSR ?04/10: AMS . Code stroke called. MRI/Head CT negative. ? Related to amio. Diuretics stopped.  ? ?Yesterday given 80 mg IV lasix. Negative 1.6 liters.   ? ?Creatinine trending up 2.45>2.6 (contrast) >2.8 >2.9>3.26>>3.29>>3.34>>3.1 ? ?Eating bacon this morning. Drank 4 cups of OJ this morning + ice. . She is frustrated and wants to go home.  ? ?Objective:   ?Weight Range: ?(!) 138.5 kg ?Body mass index is 47.83 kg/m?.  ? ?Vital Signs:   ?Temp:  [97.8 ?F (36.6 ?C)-98.1 ?F (36.7 ?C)] 98.1 ?F (36.7 ?C) (04/18 0341) ?Pulse Rate:  [79-81] 81 (04/18 0341) ?Resp:  [18-19] 18 (04/18 0341) ?BP: (126-142)/(73-80) 131/73 (04/18 0341) ?SpO2:  [95 %-100 %] 95 % (04/18 0341) ?Weight:  [138.5 kg] 138.5 kg (04/18 0100) ?Last BM Date : 12/04/21 ? ?Weight change: ?Filed Weights  ? 12/04/21 0534 12/05/21 0514 12/06/21 0100  ?Weight: (!) 138.1 kg (!) 138.3 kg (!) 138.5 kg  ? ? ?Intake/Output:  ? ?Intake/Output Summary (Last 24 hours) at 12/06/2021 0812 ?Last data filed at 12/06/2021 0342 ?Gross per 24 hour  ?Intake 900 ml  ?Output 2500 ml  ?Net -1600 ml  ?  ? ? ?Physical Exam  ?General:  No resp difficulty ?HEENT: normal ?Neck: supple. JVP 8-9 . Carotids 2+ bilat; no bruits. No lymphadenopathy or thryomegaly appreciated. ?Cor: PMI nondisplaced. Regular rate & rhythm. No rubs, gallops or murmurs. ?Lungs: clear ?Abdomen: soft, nontender, nondistended. No hepatosplenomegaly. No bruits or masses. Good bowel sounds. ?Extremities: no cyanosis, clubbing, rash, R and LLE unna boots.  ?Neuro: alert & orientedx3, cranial nerves grossly intact. moves all 4 extremities w/o difficulty. Affect  pleasant ? ?Telemetry  ? ? ?SR 70-80s personally checked.  ? ?Labs  ?  ?CBC ?Recent Labs  ?  12/04/21 ?0403  ?WBC 6.0  ?HGB 8.8*  ?HCT 28.5*  ?MCV 91.1  ?PLT 232  ? ?Basic Metabolic Panel ?Recent Labs  ?  12/05/21 ?1327 12/06/21 ?0114  ?NA 140 140  ?K 3.7 3.7  ?CL 104 103  ?CO2 27 27  ?GLUCOSE 137* 109*  ?BUN 47* 49*  ?CREATININE 3.27* 3.12*  ?CALCIUM 8.9 9.0  ? ?Liver Function Tests ?No results for input(s): AST, ALT, ALKPHOS, BILITOT, PROT, ALBUMIN in the last 72 hours. ? ?No results for input(s): LIPASE, AMYLASE in the last 72 hours. ? ?Cardiac Enzymes ?No results for input(s): CKTOTAL, CKMB, CKMBINDEX, TROPONINI in the last 72 hours. ? ? ?BNP: ?BNP (last 3 results) ?Recent Labs  ?  08/31/21 ?0114 09/23/21 ?1554 11/21/21 ?1122  ?BNP 132.7* 31.1 534.8*  ? ? ?ProBNP (last 3 results) ?No results for input(s): PROBNP in the last 8760 hours. ? ? ?D-Dimer ?No results for input(s): DDIMER in the last 72 hours. ? ?Hemoglobin A1C ?No results for input(s): HGBA1C in the last 72 hours. ? ?Fasting Lipid Panel ?No results for input(s): CHOL, HDL, LDLCALC, TRIG, CHOLHDL, LDLDIRECT in the last 72 hours. ?Thyroid Function Tests ?No results for input(s): TSH, T4TOTAL, T3FREE, THYROIDAB in the last 72 hours. ? ?Invalid input(s): FREET3 ? ? ?Other results: ? ? ?Imaging  ? ? ?No results found. ? ? ?Medications:   ? ? ?Scheduled Medications: ?  amiodarone  200 mg Oral Daily  ? apixaban  5 mg Oral BID  ? busPIRone  5 mg Oral BID  ? colchicine  0.3 mg Oral Daily  ? DULoxetine  30 mg Oral Daily  ? metoprolol succinate  25 mg Oral Daily  ? multivitamin with minerals  1 tablet Oral Daily  ? pantoprazole  40 mg Oral Daily  ? rosuvastatin  10 mg Oral QPM  ? sodium chloride flush  3 mL Intravenous Q12H  ? sodium chloride flush  3 mL Intravenous Q12H  ? sodium chloride flush  3 mL Intravenous Q12H  ? ? ?Infusions: ? sodium chloride    ? sodium chloride    ? ? ?PRN Medications: ?sodium chloride, sodium chloride, acetaminophen **OR**  acetaminophen, albuterol, sodium chloride flush, sodium chloride flush ? ? ? ?Patient Profile  ?66 y.o. female with history of morbid obesity, chronic diastolic and systolic HF with mildly reduced LVEF (45-50%), severe MR/TR, atrial fibrillation on Eliquis, HTN, and DMII.  ? ?Admitted with A Fib RVR and A/C HFrEF.  ? ? ? ?Assessment/Plan  ?1. A fib RVR ?- She has failed Tikosyn ?- Maze in 5/22 and DCCV in 6/22 & 04/19/21, last of which she had ERAF. ?- Saw Dr Quentin Ore in February, discussed AV nodal ablation + CRT if recurrent atrial arrhythmias ?- S/p successful DCCV on 04/07.  ?- Amio gtt switched to po amio 400 mg BID on 04/08.  ?- Initially stopped amio d/t concern contributing to neuro issus. Amio restarted 04/12. Do not feel amio contributing stuttering. Continue amio 200 mg daily .  ?- if she goes in A fib again ? Rate control. Maintaining SR.  ?- In SR  ?-Continue Eliquis 5 mg BID. ?  ?2. A/C HFrEF,NICM ?- RHC (5/22): elevated R>L filling pressures with PAPi low, suggestive of significant component of RV dysfunction, prominent v-waves in PCWP and RA tracings suggestive of MR/TR, CO low ?- RHC (6/22) with low filling pressures with moderately reduced CO. ?- RHC 07/2021 normal filling pressures and moderately reduced cardiac output.  ?- Echo (7/22) in setting of AFL with RVR, showed EF 30-35%, moderate LVH, moderate RV dysfunction, PASP 48, s/p MV repair with mild MR and mean gradient 10 mmHg with HR around 130, s/p TV repair with mild-moderate TR, dilated IVC.  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  ?- Echo 11/22/21: EF 30-35% RV moderately reduced, RVSP 47 mmHg, severe BAE, mild MS, severe TR despite TV repair ?- RHC 11/24/21: RA 29, PA 67/37 (45), PCW 30 ( v waves to 45), Fick CO/CI 6.2/2.5, PA sat 50%, 55%, PAPi 1.0. Severely elevated biventricular pressures, moderate pulmonary hypertension with marked V waves in PCWP tracing and severe RV failure. ?- Suspect AF contributing to decompensation. S/p successful  DCCV on 04/07 as above. ?- Milrinone stopped 4/11.   ?- Volume status remains elevated. Suspect due to fluid intake and diet. Discussed limiting fluid intake to < 2 liters and low salt diet.  ?- Give 80 mg IV lasix bid + 2.5 mg metolazone.   ?- GDMT limited by AKI on CKD, eventually consider spiro and SGLT2i ?- Continue toprol xl 25 mg daily ?- Continue unna boots.  ?- Consult dietitian ? ?3. PVCs ?- Improved off milrinone ?< 10 per hour.  ? ?4. Valvular Heart Disease ?-S/p MV and TV repair in 5/22 ?-Echo 04/23: EF 30-35%, RV moderately reduced, severe TR ?-RHC 4/6 .  Severely elevated biventricular pressures, moderate pulmonary hypertension and severe RV failure. PAPi 1.0. ?-  Not sure if there are any options for repair. Need to allow for renal recovery at this point. ? ?5. CAD/Chest pain ?- trivial CAD on heart cath 2019 ?- No chest pain.  ?- hstrop negative. Doubt ischemic ?  ?6. CKD Stage IV ?- baseline Scr 2-2.5 ?- Scr 2.09>2.46.2.63>2.54>2.45>2.60>2.8 >>2.9>>3.26>>3.29>>3.4>>3.3>3.1 ?  ?7. DMII ?- per primary  ?- consider SGLT2i as renal function stabilizes ?  ?8. Morbid Obesity  ?- needs weight loss ?- May be candidate for GLP1RA ? ?9. Anemia ?- Baseline 10-12, Hgb 8.8 on 4/16  4/12 ?- No bleeding issues.  ?- Iron stores low. Consider feraheme once treatment for mastoiditis complete. ? ?10. Confusion/slurred speech/tremor. Stuttering ?- 4/10 Code stroke activated.Neurology consulted.  ?-  MRI - no acute findings. CT unrevealing.  ?- Has not missed any doses of Eliquis since admission. ?- ? Amiodarone contributing to tremor. Amio stopped but added back 04/12 to maintain sinus rhythm. Doubt contributing to stutter. Uncertain etiology. ?- Resolved.   ?- Medications including narcotics, benadryl and duloxetine adjusted by primary team ? ?Disposition: ?PT/OT suggested [facility] PT/OT.  ? ?Discussed with Dr Broadus John at the bedside.  ?CIR is not an option at her functional level. ? SNF versus HH.  ?  ?Length of Stay:  14 ? ?Darrick Grinder, NP  ?12/06/2021, 8:12 AM ? ?Advanced Heart Failure Team ?Pager [phone] (M-F; 7a - 5p)  ?Please contact [facility] for night-coverage after hours (5p -7a ) and weekends on [email or web address] ? ?Patient seen and examined

## 2021-12-06 NOTE — Progress Notes (Signed)
Mobility Specialist Progress Note: ? ? 12/06/21 1550  ?Mobility  ?Activity Ambulated with assistance in hallway  ?Level of Assistance Standby assist, set-up cues, supervision of patient - no hands on  ?Assistive Device Four wheel walker  ?Distance Ambulated (ft) 250 ft  ?Activity Response Tolerated well  ?$Mobility charge 1 Mobility  ? ?Pt agreeable to hallway ambulation at this time. Ambulated at supervision level with rollator. Asdx throughout session. Pt left sitting in chair with RN present to remove unna boots.  ? ?Nelta Numbers ?Acute Rehab ?Phone: [phone] ?Office Phone: [phone] ? ?

## 2021-12-06 NOTE — Discharge Instructions (Signed)
Low Sodium Nutrition Therapy  ?Eating less sodium can help you if you have high blood pressure, heart failure, or kidney or liver disease.  ? ?Your body needs a little sodium, but too much sodium can cause your body to hold onto extra water. This extra water will raise your blood pressure and can cause damage to your heart, kidneys, or liver as they are forced to work harder.  ? ?Sometimes you can see how the extra fluid affects you because your hands, legs, or belly swell. You may also hold water around your heart and lungs, which makes it hard to breathe.  ? ?Even if you take medication for blood pressure or a water pill (diuretic) to remove fluid, it is still important to have less salt in your diet.  ? ?Check with your primary care provider before drinking alcohol since it may affect the amount of fluid in your body and how your heart, kidneys, or liver work. ?Sodium in Food ?A low-sodium meal plan limits the sodium that you get from food and beverages to 1,500-2,000 milligrams (mg) per day. Salt is the main source of sodium. Read the nutrition label on the package to find out how much sodium is in one serving of a food.  ?Select foods with 140 milligrams (mg) of sodium or less per serving.  ?You may be able to eat one or two servings of foods with a little more than 140 milligrams (mg) of sodium if you are closely watching how much sodium you eat in a day.  ?Check the serving size on the label. The amount of sodium listed on the label shows the amount in one serving of the food. So, if you eat more than one serving, you will get more sodium than the amount listed. ? ?Tips ?Cutting Back on Sodium ?Eat more fresh foods.  ?Fresh fruits and vegetables are low in sodium, as well as frozen vegetables and fruits that have no added juices or sauces.  ?Fresh meats are lower in sodium than processed meats, such as bacon, sausage, and hotdogs.  ?Not all processed foods are unhealthy, but some processed foods may have too  much sodium.  ?Eat less salt at the table and when cooking. One of the ingredients in salt is sodium.  ?One teaspoon of table salt has 2,300 milligrams of sodium.  ?Leave the salt out of recipes for pasta, casseroles, and soups. ?Be a Paramedic.  ?Food packages that say ?Salt-free?, sodium-free?, ?very low sodium,? and ?low sodium? have less than 140 milligrams of sodium per serving.  ?Beware of products identified as ?Unsalted,? ?No Salt Added,? ?Reduced Sodium,? or ?Lower Sodium.? These items may still be high in sodium. You should always check the nutrition label. ?Add flavors to your food without adding sodium.  ?Try lemon juice, lime juice, or vinegar.  ?Dry or fresh herbs add flavor.  ?Buy a sodium-free seasoning blend or make your own at home. ?You can purchase salt-free or sodium-free condiments like barbeque sauce in stores and online. Ask your registered dietitian nutritionist for recommendations and where to find them.  ? ?Eating in Restaurants ?Choose foods carefully when you eat outside your home. Restaurant foods can be very high in sodium. Many restaurants provide nutrition facts on their menus or their websites. If you cannot find that information, ask your server. Let your server know that you want your food to be cooked without salt and that you would like your salad dressing and sauces to be served on the  side.  ? ? ?Foods Recommended ?Food Group Foods Recommended  ?Grains Bread, bagels, rolls without salted tops ?Homemade bread made with reduced-sodium baking powder ?Cold cereals, especially shredded wheat and puffed rice ?Oats, grits, or cream of wheat ?Pastas, quinoa, and rice ?Popcorn, pretzels or crackers without salt ?Corn tortillas  ?Protein Foods Fresh meats and fish; [place] bacon (check the nutrition labels - make sure they are not packaged in a sodium solution) ?Canned or packed tuna (no more than 4 ounces at 1 serving) ?Beans and peas ?Soybeans) and tofu ?Eggs ?Nuts or nut butters  without salt  ?Dairy Milk or milk powder ?Plant milks, such as rice and soy ?Yogurt, including Greek yogurt ?Small amounts of natural cheese (blocks of cheese) or reduced-sodium cheese can be used in moderation. (Swiss, ricotta, and fresh mozzarella cheese are lower in sodium than the others) ?Cream Cheese ?Low sodium cottage cheese  ?Vegetables Fresh and frozen vegetables without added sauces or salt ?Homemade soups (without salt) ?Low-sodium, salt-free or sodium-free canned vegetables and soups  ?Fruit Fresh and canned fruits ?Dried fruits, such as raisins, cranberries, and prunes  ?Oils Tub or liquid margarine, regular or without salt ?Canola, corn, peanut, olive, safflower, or sunflower oils  ?Condiments Fresh or dried herbs such as basil, bay leaf, dill, mustard (dry), nutmeg, paprika, parsley, rosemary, sage, or thyme.  ?Low sodium ketchup ?Vinegar  ?Lemon or lime juice ?Pepper, red pepper flakes, and cayenne. ?Hot sauce contains sodium, but if you use just a drop or two, it will not add up to much.  ?Salt-free or sodium-free seasoning mixes and marinades ?Simple salad dressings: vinegar and oil  ? ?Foods Not Recommended ?Food Group Foods Not Recommended  ?Grains Breads or crackers topped with salt ?Cereals (hot/cold) with more than 300 mg sodium per serving ?Biscuits, cornbread, and other ?quick? breads prepared with baking soda ?Pre-packaged bread crumbs ?Seasoned and packaged rice and pasta mixes ?Self-rising flours  ?Protein Foods Cured meats: Bacon, ham, sausage, pepperoni and hot dogs ?Canned meats (chili, vienna sausage, or sardines) ?Smoked fish and meats ?Frozen meals that have more than 600 mg of sodium per serving ?Egg substitute (with added sodium)  ?Dairy Buttermilk ?Processed cheese spreads ?Cottage cheese (1 cup may have over 500 mg of sodium; look for low-sodium.) ?American or feta cheese ?Shredded Cheese has more sodium than blocks of cheese ?String cheese  ?Vegetables Canned vegetables  (unless they are salt-free, sodium-free or low sodium) ?Frozen vegetables with seasoning and sauces ?Sauerkraut and pickled vegetables ?Canned or dried soups (unless they are salt-free, sodium-free, or low sodium) ?[place] fries and onion rings  ?Fruit Dried fruits preserved with additives that have sodium  ?Oils Salted butter or margarine, all types of olives  ?Condiments Salt, sea salt, kosher salt, onion salt, and garlic salt ?Seasoning mixes with salt ?Bouillon cubes ?Ketchup ?Barbeque sauce and Worcestershire sauce unless low sodium ?Soy sauce ?Salsa, pickles, olives, relish ?Salad dressings: ranch, blue cheese, [place], and [place].  ? ?Low Sodium Sample 1-Day Menu  ?Breakfast 1 cup cooked oatmeal  ?1 slice whole wheat bread toast  ?1 tablespoon peanut butter without salt  ?1 banana  ?1 cup 1% milk  ?Lunch Tacos made with: 2 corn tortillas  ?? cup black beans, low sodium  ?? cup roasted or grilled chicken (without skin)  ?? avocado  ?Squeeze of lime juice  ?1 cup salad greens  ?1 tablespoon low-sodium salad dressing  ?? cup strawberries  ?1 orange  ?Afternoon Snack 1/3 cup grapes  ?6 ounces yogurt  ?  Evening Meal 3 ounces herb-baked fish  ?1 baked potato  ?2 teaspoons olive oil  ?? cup cooked carrots  ?2 thick slices tomatoes on:  ?2 lettuce leaves  ?1 teaspoon olive oil  ?1 teaspoon balsamic vinegar  ?1 cup 1% milk  ?Evening Snack 1 apple  ?? cup almonds without salt  ? ?Low-Sodium Vegetarian (Lacto-Ovo) Sample 1-Day Menu  ?Breakfast 1 cup cooked oatmeal  ?1 slice whole wheat toast  ?1 tablespoon peanut butter without salt  ?1 banana  ?1 cup 1% milk  ?Lunch Tacos made with: 2 corn tortillas  ?? cup black beans, low sodium  ?? cup roasted or grilled chicken (without skin)  ?? avocado  ?Squeeze of lime juice  ?1 cup salad greens  ?1 tablespoon low-sodium salad dressing  ?? cup strawberries  ?1 orange  ?Evening Meal Stir fry made with: ? cup tofu  ?1 cup brown rice  ?? cup broccoli  ?? cup green beans  ?? cup  peppers  ?? tablespoon peanut oil  ?1 orange  ?1 cup 1% milk  ?Evening Snack 4 strips celery  ?2 tablespoons hummus  ?1 hard-boiled egg  ? ?Low-Sodium Vegan Sample 1-Day Menu  ?Breakfast 1 cup cooked oatmeal  ?1

## 2021-12-07 ENCOUNTER — Other Ambulatory Visit (HOSPITAL_COMMUNITY): Payer: Self-pay

## 2021-12-07 LAB — BASIC METABOLIC PANEL
Anion gap: 6 (ref 5–15)
BUN: 46 mg/dL — ABNORMAL HIGH (ref 8–23)
CO2: 30 mmol/L (ref 22–32)
Calcium: 9 mg/dL (ref 8.9–10.3)
Chloride: 105 mmol/L (ref 98–111)
Creatinine, Ser: 2.97 mg/dL — ABNORMAL HIGH (ref 0.44–1.00)
GFR, Estimated: 17 mL/min — ABNORMAL LOW (ref >60)
Glucose, Bld: 112 mg/dL — ABNORMAL HIGH (ref 70–99)
Potassium: 3.3 mmol/L — ABNORMAL LOW (ref 3.5–5.1)
Sodium: 141 mmol/L (ref 135–145)

## 2021-12-07 LAB — GLUCOSE, CAPILLARY: Glucose-Capillary: 134 mg/dL — ABNORMAL HIGH (ref 70–99)

## 2021-12-07 MED ORDER — FUROSEMIDE 10 MG/ML IJ SOLN
80.0000 mg | Freq: Once | INTRAMUSCULAR | Status: AC
  Administered 2021-12-07: 80 mg via INTRAVENOUS
  Filled 2021-12-07: qty 8

## 2021-12-07 MED ORDER — METOLAZONE 2.5 MG PO TABS
2.5000 mg | ORAL_TABLET | ORAL | 0 refills | Status: DC
  Filled 2021-12-07: qty 10, 70d supply, fill #0

## 2021-12-07 MED ORDER — ACETAMINOPHEN 325 MG PO TABS: 650.0000 mg | ORAL_TABLET | Freq: Four times a day (QID) | ORAL | Status: AC | PRN

## 2021-12-07 MED ORDER — TORSEMIDE 20 MG PO TABS
40.0000 mg | ORAL_TABLET | Freq: Two times a day (BID) | ORAL | 0 refills | Status: DC
Start: 2021-12-07 — End: 2021-12-14
  Filled 2021-12-07: qty 120, 30d supply, fill #0

## 2021-12-07 MED ORDER — POTASSIUM CHLORIDE CRYS ER 20 MEQ PO TBCR
20.0000 meq | EXTENDED_RELEASE_TABLET | Freq: Every day | ORAL | 0 refills | Status: DC
  Filled 2021-12-07: qty 30, 30d supply, fill #0

## 2021-12-07 MED ORDER — POTASSIUM CHLORIDE CRYS ER 20 MEQ PO TBCR
40.0000 meq | EXTENDED_RELEASE_TABLET | ORAL | Status: AC
  Administered 2021-12-07 (×2): 40 meq via ORAL
  Filled 2021-12-07 (×2): qty 2

## 2021-12-07 NOTE — Plan of Care (Signed)
?  Problem: Education: ?Goal: Ability to demonstrate management of disease process will improve ?Outcome: Adequate for Discharge ?Goal: Ability to verbalize understanding of medication therapies will improve ?Outcome: Adequate for Discharge ?Goal: Individualized Educational Video(s) ?Outcome: Adequate for Discharge ?  ?Problem: Activity: ?Goal: Capacity to carry out activities will improve ?Outcome: Adequate for Discharge ?  ?Problem: Cardiac: ?Goal: Ability to achieve and maintain adequate cardiopulmonary perfusion will improve ?Outcome: Adequate for Discharge ?  ?Problem: Acute Rehab OT Goals (only OT should resolve) ?Goal: Pt. Will Perform Grooming ?Outcome: Adequate for Discharge ?Goal: Pt. Will Perform Lower Body Dressing ?Outcome: Adequate for Discharge ?Goal: Pt. Will Transfer To Toilet ?Outcome: Adequate for Discharge ?Goal: Pt. Will Perform Tub/Shower Transfer ?Outcome: Adequate for Discharge ?Goal: OT Additional ADL Goal #1 ?Outcome: Adequate for Discharge ?  ?Problem: Acute Rehab PT Goals(only PT should resolve) ?Goal: Pt Will Go Supine/Side To Sit ?Outcome: Adequate for Discharge ?Goal: Pt Will Go Sit To Supine/Side ?Outcome: Adequate for Discharge ?Goal: Patient Will Transfer Sit To/From Stand ?Outcome: Adequate for Discharge ?Goal: Pt Will Ambulate ?Outcome: Adequate for Discharge ?  ?Problem: Increased Nutrient Needs (NI-5.1) ?Goal: Food and/or nutrient delivery ?Description: Individualized approach for food/nutrient provision. ?Outcome: Adequate for Discharge ?  ?Problem: Education: ?Goal: Knowledge of General Education information will improve ?Description: Including pain rating scale, medication(s)/side effects and non-pharmacologic comfort measures ?Outcome: Adequate for Discharge ?  ?Problem: Health Behavior/Discharge Planning: ?Goal: Ability to manage health-related needs will improve ?Outcome: Adequate for Discharge ?  ?Problem: Clinical Measurements: ?Goal: Ability to maintain clinical  measurements within normal limits will improve ?Outcome: Adequate for Discharge ?Goal: Will remain free from infection ?Outcome: Adequate for Discharge ?Goal: Diagnostic test results will improve ?Outcome: Adequate for Discharge ?Goal: Respiratory complications will improve ?Outcome: Adequate for Discharge ?Goal: Cardiovascular complication will be avoided ?Outcome: Adequate for Discharge ?  ?Problem: Activity: ?Goal: Risk for activity intolerance will decrease ?Outcome: Adequate for Discharge ?  ?Problem: Nutrition: ?Goal: Adequate nutrition will be maintained ?Outcome: Adequate for Discharge ?  ?Problem: Coping: ?Goal: Level of anxiety will decrease ?Outcome: Adequate for Discharge ?  ?Problem: Elimination: ?Goal: Will not experience complications related to bowel motility ?Outcome: Adequate for Discharge ?Goal: Will not experience complications related to urinary retention ?Outcome: Adequate for Discharge ?  ?Problem: Pain Managment: ?Goal: General experience of comfort will improve ?Outcome: Adequate for Discharge ?  ?Problem: Safety: ?Goal: Ability to remain free from injury will improve ?Outcome: Adequate for Discharge ?  ?Problem: Skin Integrity: ?Goal: Risk for impaired skin integrity will decrease ?Outcome: Adequate for Discharge ?  ?

## 2021-12-07 NOTE — Discharge Summary (Signed)
?Physician Discharge Summary ?  ?Patient: [name] MRN: [id number] DOB: Jul 20, 1956  ?Admit date:     11/21/2021  ?Discharge date: 12/07/21  ?Discharge Physician: Jimmy Picket Bradi [place]  ? ?PCP: [name], NP  ? ?Recommendations at discharge:  ? ? Patient will continue diuresis with torsemide 40 mg bid and metalazone 2,5 mg once weekly.  ?Continue K supplementation daily and an extra dose with metolazone. ?Follow up as outpatient.  ? ?Discharge Diagnoses: ?Principal Problem: ?  Acute on chronic systolic CHF (congestive heart failure) ([place]) ?Active Problems: ?  AF (paroxysmal atrial fibrillation) ([place]) ?  Acute kidney injury superimposed on chronic kidney disease ([place]) ?  Type 2 diabetes mellitus with hyperlipidemia ([place]) ?  Acute mastoiditis, left ?  Class 3 obesity (H[facility]) ?  Altered mental status ? ?Resolved Problems: ?  * No resolved hospital problems. * ? ?Hospital Course: ?[name] was admitted to the hospital with the working diagnosis of decompensated heart failure. ? 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 Reported edema of arms and legs for 2 weeks, and dyspnea for 3 days into her hospitalization. Positive weight gain.  ?Frequent hospitalizations for heart failure, recent one on 08/2021, placed on daily torsemide and 3 time per week metolazone.  ? ?On her initial physical examination her blood pressure was 130/85, HR 142, RR 28, 02 saturation 99%, heart S1 and S2 present, irregularly irregular, tachycardic, lungs with no wheezing, but positive rales, abdomen soft and no lower extremity edema.  ? ?Na 139, K 3,6, CL 105, bicarbonate 24, glucose 147, BUN 16 and CR 2.0 ?BNP 534 ?Wbc 6.5 hgb 10,3 hct 33.0 plt 225  ?Sars covid 19 negative ? ?Chest radiograph with cardiomegaly, bilateral hilar vascular congestion, no infiltrates.  ? ?EKG 105 bpm,  right axis deviation, Qtc 565, atrial fibrillation rhythm, with no significant ST segment changes, negative T wave lead I and AVL.  ? ?Patient placed on IV furosemide for diuresis.  ?04/06 biventricular failure with elevated pulmonary pressures and wedge pressure. ?Placed on inotropic support with milrinone and furosemide drip.  ?04/07 TEE cardioversion converting to sinus rhythm.  ? ?04/10 patient with acute altered mental status, slurring speech and stuttering.  ?CODE stroke was called and MRI was ordered.  ?CVA ruled out, possible medication induced neurologic side effects (amiodarone or gabapentin).  ?04/11 milrinone discontinued.  ?04/12 worsening renal failure, continue with stuttering and tremors.  ?04/13 neurologic deficits are improving.  ?04/14 renal function more stable but not back to baseline.  ? ?Continue diuresis with furosemide and metolazone with further improvement in renal function. ?Patient will follow up as outpatient with cardiology.  ? ? ?Assessment and Plan: ?* Acute on chronic systolic CHF (congestive heart failure) ([place]) ?Patient had a prolonged hospitalization, admitted to the cardiac ward and had aggressive diuresis with furosemide and metolazone.  ?Negative fluid balance was achieved -25,523 ml, since admission with improvement in her symptoms.  ? ?Echocardiogram with reduced LV systolic function 35 to 69% with global hypokinesis, interventricular septum is flattened and systole and diastole. Reduced RV systolic function, moderate. RVSP 46.9. sever dilatation of right and left atriums. Severe tricuspid valve regurgitation.  ? ?Cardiac catheterization with PA 67/37, mean 45, PCW 30, with cardiac output 6,2 and index 2,5 ?Consistent with biventricular failure, with pulmonary hypertension ?Pulmonary hypertension.  ? ?Patient was placed on inotropic support with milrinone and  aggressive diuresis with furosemide drip.  ?Prolonged hospitalization due to development of renal failure limiting  further diuresis.  ? ?At the time of her discharge her renal function improved and allowing diuresis.  ?At the time of her discharge she will continue with metoprolol and diuresis with oral furosemide plus metolazone. ?Will need further follow up as outpatient.  ?Holding ras inhibition due to risk of worsening renal function. ?Start SGLT2i as outpatient when renal function more stable.  ? ? ? ?AF (paroxysmal atrial fibrillation) ([place]) ?Patient underwent direct current cardioversion converting to sinus rhythm. ?Loaded with amiodarone and continue anticoagulation with apixaban.  ? ?Amiodarone was interrupted for encephalopathy suspected medications induced.  ?After improvement on encephalopathy, amiodarone was resumed with good toleration.  ?Plan to continue with 200 mg daily and follow up as outpatient.  ?Continue anticoagulation with apixaban. ? ?Acute kidney injury superimposed on chronic kidney disease ([place]) ?CKD stage 4, Hypokalemia. ? ?Patient developed AKI related to diuresis. With conservative medical care, renal function has improved and diuresis has been resumed.  ? ?At the time of her discharge her renal function has a serum cr of 2.97, K 3,3 and serum bicarbonate at 30. ?KCl repletion before discharge and plan to follow up renal function as outpatient. ? ?Type 2 diabetes mellitus with hyperlipidemia ([place]) ?Patient's glucose remained stable, initially placed on insulin sliding scale, then discontinued. ?Fasting glucose at the time of her discharge is 112 mg/dl.   ? ?Acute mastoiditis, left ?Completed 7 days of Augmentin.  ? ?Class 3 obesity ([place]) ?Calculated BMI is 51,9  ? ?Altered mental status ?CVA has been ruled out.  ?She developed stuttering and tremors that slowly improved, possible medication related to gabapentin. ? ?Low GFR likely made recovery more slow.  ?Continue neuro checks per unit protocol.  ? ?For depression continue with buspirone. ?For back pain continue with duloxetine. ? ? ? ? ?   ? ? ?Consultants: cardiology and neurology  ?Procedures performed: DC cardioversion   ?Disposition: Home ?Diet recommendation:  ?Discharge Diet Orders (From admission, onward)  ? ?  Start     Ordered  ? 12/07/21 0000  Diet - low sodium heart healthy       ? 12/07/21 1213  ? ?  ?  ? ?  ? ?Cardiac diet ?DISCHARGE MEDICATION: ?Allergies as of 12/07/2021   ? ?   Reactions  ? Bee Pollen Anaphylaxis  ? Sulfa Antibiotics Itching  ? ?  ? ?  ?Medication List  ?  ? ?STOP taking these medications   ? ?allopurinol 100 MG tablet ?Commonly known as: ZYLOPRIM ?  ?benzonatate 200 MG capsule ?Commonly known as: TESSALON ?  ?guaiFENesin-dextromethorphan 100-10 MG/5ML syrup ?Commonly known as: ROBITUSSIN DM ?  ?oxyCODONE 5 MG immediate release tablet ?Commonly known as: Roxicodone ?  ?oxyCODONE-acetaminophen 5-325 MG tablet ?Commonly known as: PERCOCET/ROXICET ?  ? ?  ? ?TAKE these medications   ? ?Accu-Chek Guide Me w/Device Kit ?Use as instructed to check blood sugar three times daily. E11.69 ?  ?Accu-Chek Guide test strip ?Generic drug: glucose blood ?Use to check blood sugar TID. E11.69 ?  ?acetaminophen 325 MG tablet ?Commonly known as: TYLENOL ?Take 2 tablets (650 mg total) by mouth every 6 (six) hours as needed for mild pain or moderate pain. ?What changed:  ?medication strength ?how much to take ?reasons to take this ?  ?albuterol 108 (90 Base) MCG/ACT inhaler ?Commonly known as: VENTOLIN HFA ?INHALE 2 PUFFS BY MOUTH EVERY 6 HOURS AS NEEDED ?What changed: reasons  to take this ?  ?amiodarone 200 MG tablet ?Commonly known as: PACERONE ?Take 1 tablet (200 mg total) by mouth daily. ?  ?apixaban 5 MG Tabs tablet ?Commonly known as: Eliquis ?Take 1 tablet (5 mg total) by mouth 2 (two) times daily. ?  ?B-D SINGLE USE SWABS REGULAR Pads ?1 each by Other route daily. ?  ?Alcohol Swabs Pads ?1 application by Does not apply route in the morning, at noon, and at bedtime. ?  ?blood glucose meter kit and supplies ?Dispense based on patient  and insurance preference. Use up to four times daily as directed. (FOR ICD-10 E10.9, E11.9). ?  ?blood glucose meter kit and supplies Kit ?Dispense based on patient and insurance preference. Use up to four times

## 2021-12-07 NOTE — TOC Transition Note (Signed)
Transition of Care (TOC) - CM/SW Discharge Note ? ? ?Patient Details  ?Name: [name] ?MRN: [id number] ?Date of Birth: June 26, 1956 ? ?Transition of Care (TOC) CM/SW Contact:  ?Zenon Mayo, RN ?Phone Number: ?12/07/2021, 12:11 PM ? ? ?Clinical Narrative:    ?Patient for possible dc today, she is set up with [facility]  for HHPT per previous NCM.  Patient states her friend will transport her home at dc.  ? ? ?  ?Barriers to Discharge: Continued Medical Work up ? ? ?Patient Goals and CMS Choice ?Patient states their goals for this hospitalization and ongoing recovery are:: wants to go home and get back to her baseline ?CMS Medicare.gov Compare Post Acute Care list provided to:: Patient ?Choice offered to / list presented to : Patient ? ?Discharge Placement ?  ?           ?  ?  ?  ?  ? ?Discharge Plan and Services ?  ?Discharge Planning Services: CM Consult ?Post Acute Care Choice: Home Health          ?  ?  ?  ?  ?  ?  ?[name] Agency: [facility] ?Date HH Agency Contacted: 11/29/21 ?Time [place]: [id number] ?Representative spoke with at [facility]: Romie Jumper ? ?Social Determinants of Health (SDOH) Interventions ?  ? ? ?Readmission Risk Interventions ? ?  08/31/2021  ?  1:49 PM 02/10/2021  ? 10:17 AM 01/11/2021  ? 12:21 PM  ?Readmission Risk Prevention Plan  ?Transportation Screening Complete Complete Complete  ?Medication Review (RN Care Manager) Referral to Pharmacy Complete   ?PCP or Specialist appointment within 3-5 days of discharge Complete Complete Complete  ?[place] or Home Care Consult Complete Complete Complete  ?SW Recovery Care/Counseling Consult Complete Complete Complete  ?Palliative Care Screening Not Applicable Not Applicable Not Applicable  ?[place] Not Applicable Not Applicable Not Applicable  ? ? ? ? ? ?

## 2021-12-07 NOTE — Progress Notes (Addendum)
? ? Advanced Heart Failure Rounding Note ? ?PCP-Cardiologist: Buford Dresser, MD  ? ?Subjective:   ? ?04/03: Admitted with A fib RVR and A/C HFrEF.  Started on amio drip. Diuresing with IV lasix.   ?04/06: RHC with severely elevated biventricular pressures. Severe RV failure. Started milrinone 0.125 + lasix gtt  ?04/07: S/p DCCV, Afib >>>NSR ?04/10: AMS . Code stroke called. MRI/Head CT negative.   ? ?Diuresed well yesterday with 80 mg lasix IV BID + 2.5 mg metolazone. ? ?Down 9 lb ? ?Creatinine trending up 2.45>2.6 (contrast) >2.8 >2.9>3.26>>3.29>>3.34>>3.1>>2.97 ? ?Leg edema significantly improved. Denies dyspnea. Wants to go home. ? ? ?Objective:   ?Weight Range: ?134.4 kg ?Body mass index is 46.39 kg/m?.  ? ?Vital Signs:   ?Temp:  [98.5 ?F (36.9 ?C)-98.7 ?F (37.1 ?C)] 98.6 ?F (37 ?C) (04/19 0900) ?Pulse Rate:  [78-80] 78 (04/19 0900) ?Resp:  [18] 18 (04/19 0428) ?BP: (117-132)/(61-80) 132/68 (04/19 0900) ?SpO2:  [96 %-99 %] 96 % (04/19 0900) ?Weight:  [134.4 kg] 134.4 kg (04/19 0428) ?Last BM Date : 12/06/21 ? ?Weight change: ?Filed Weights  ? 12/05/21 0514 12/06/21 0100 12/07/21 0428  ?Weight: (!) 138.3 kg (!) 138.5 kg 134.4 kg  ? ? ?Intake/Output:  ? ?Intake/Output Summary (Last 24 hours) at 12/07/2021 1145 ?Last data filed at 12/07/2021 1059 ?Gross per 24 hour  ?Intake 723 ml  ?Output 5200 ml  ?Net -4477 ml  ?  ? ? ?Physical Exam  ? ?General:  Sitting in chair. No distress. ?HEENT: normal ?Neck: supple. JVP 10-12. Carotids 2+ bilat; no bruits. ?Cor: PMI nondisplaced. Regular rate & rhythm. No rubs, gallops or murmurs. ?Lungs: clear ?Abdomen: obese, soft, nontender, nondistended.  ?Extremities: no cyanosis, clubbing, rash, trace edema ?Neuro: alert & orientedx3, cranial nerves grossly intact. moves all 4 extremities w/o difficulty. Affect pleasant ? ? ? ?Telemetry  ? ? ?SR 60s-70s, 5-10 PVCs/min (personally reviewed) ? ?Labs  ?  ?CBC ?No results for input(s): WBC, NEUTROABS, HGB, HCT, MCV, PLT in the  last 72 hours. ? ?Basic Metabolic Panel ?Recent Labs  ?  12/06/21 ?0114 12/07/21 ?0345  ?NA 140 141  ?K 3.7 3.3*  ?CL 103 105  ?CO2 27 30  ?GLUCOSE 109* 112*  ?BUN 49* 46*  ?CREATININE 3.12* 2.97*  ?CALCIUM 9.0 9.0  ? ?Liver Function Tests ?No results for input(s): AST, ALT, ALKPHOS, BILITOT, PROT, ALBUMIN in the last 72 hours. ? ?No results for input(s): LIPASE, AMYLASE in the last 72 hours. ? ?Cardiac Enzymes ?No results for input(s): CKTOTAL, CKMB, CKMBINDEX, TROPONINI in the last 72 hours. ? ? ?BNP: ?BNP (last 3 results) ?Recent Labs  ?  08/31/21 ?0114 09/23/21 ?1554 11/21/21 ?1122  ?BNP 132.7* 31.1 534.8*  ? ? ?ProBNP (last 3 results) ?No results for input(s): PROBNP in the last 8760 hours. ? ? ?D-Dimer ?No results for input(s): DDIMER in the last 72 hours. ? ?Hemoglobin A1C ?No results for input(s): HGBA1C in the last 72 hours. ? ?Fasting Lipid Panel ?No results for input(s): CHOL, HDL, LDLCALC, TRIG, CHOLHDL, LDLDIRECT in the last 72 hours. ?Thyroid Function Tests ?No results for input(s): TSH, T4TOTAL, T3FREE, THYROIDAB in the last 72 hours. ? ?Invalid input(s): FREET3 ? ? ?Other results: ? ? ?Imaging  ? ? ?No results found. ? ? ?Medications:   ? ? ?Scheduled Medications: ? amiodarone  200 mg Oral Daily  ? apixaban  5 mg Oral BID  ? busPIRone  5 mg Oral BID  ? colchicine  0.3 mg Oral Daily  ?  DULoxetine  30 mg Oral Daily  ? furosemide  80 mg Intravenous Once  ? metolazone  2.5 mg Oral Daily  ? metoprolol succinate  25 mg Oral Daily  ? multivitamin with minerals  1 tablet Oral Daily  ? pantoprazole  40 mg Oral Daily  ? potassium chloride  40 mEq Oral Q4H  ? rosuvastatin  10 mg Oral QPM  ? sodium chloride flush  3 mL Intravenous Q12H  ? sodium chloride flush  3 mL Intravenous Q12H  ? sodium chloride flush  3 mL Intravenous Q12H  ? ? ?Infusions: ? sodium chloride    ? sodium chloride    ? ? ?PRN Medications: ?sodium chloride, sodium chloride, acetaminophen **OR** acetaminophen, albuterol, sodium chloride flush,  sodium chloride flush ? ? ? ?Patient Profile  ?66 y.o. female with history of morbid obesity, chronic diastolic and systolic HF with mildly reduced LVEF (45-50%), severe MR/TR, atrial fibrillation on Eliquis, HTN, and DMII.  ? ?Admitted with A Fib RVR and A/C HFrEF.  ? ? ? ?Assessment/Plan  ?1. A fib RVR ?- She has failed Tikosyn ?- Maze in 5/22 and DCCV in 6/22 & 04/19/21, last of which she had ERAF. ?- Saw Dr Quentin Ore in February, discussed AV nodal ablation + CRT if recurrent atrial arrhythmias ?- S/p successful DCCV on 04/07.  ?- Amio gtt switched to po amio 400 mg BID on 04/08.  ?- Initially stopped amio d/t concern contributing to neuro issus. Amio restarted 04/12. Do not feel amio contributing stuttering. Continue amio 200 mg daily .  ?- if she goes in A fib again ? Rate control. Now maintaining SR. ?-Continue Eliquis 5 mg BID. ?  ?2. A/C HFrEF,NICM ?- RHC (5/22): elevated R>L filling pressures with PAPi low, suggestive of significant component of RV dysfunction, prominent v-waves in PCWP and RA tracings suggestive of MR/TR, CO low ?- RHC (6/22) with low filling pressures with moderately reduced CO. ?- RHC 07/2021 normal filling pressures and moderately reduced cardiac output.  ?- Echo (7/22) in setting of AFL with RVR, showed EF 30-35%, moderate LVH, moderate RV dysfunction, PASP 48, s/p MV repair with mild MR and mean gradient 10 mmHg with HR around 130, s/p TV repair with mild-moderate TR, dilated IVC.  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  ?- Echo 11/22/21: EF 30-35% RV moderately reduced, RVSP 47 mmHg, severe BAE, mild MS, severe TR despite TV repair ?- RHC 11/24/21: RA 29, PA 67/37 (45), PCW 30 ( v waves to 45), Fick CO/CI 6.2/2.5, PA sat 50%, 55%, PAPi 1.0. Severely elevated biventricular pressures, moderate pulmonary hypertension with marked V waves in PCWP tracing and severe RV failure. ?- Suspect AF contributing to decompensation. S/p successful DCCV on 04/07 as above. ?- Milrinone stopped 4/11.   ?-  Diuresing well with 80 mg IV lasix bid + 2.5 mg metolazone.   ?- Really wants to go home. Will give another 80 mg lasix IV this afternoon then she can go home from HF perspective ?- GDMT limited by AKI on CKD, eventually consider spiro and SGLT2i ?- Continue toprol xl 25 mg daily ?- Seen by dietician ? ?3. PVCs ?- Improved off milrinone ?- 5-10/min ?- Already on amiodarone for atrial fibrillation ? ?4. Valvular Heart Disease ?-S/p MV and TV repair in 5/22 ?-Echo 04/23: EF 30-35%, RV moderately reduced, severe TR ?-RHC 4/6 .  Severely elevated biventricular pressures, moderate pulmonary hypertension and severe RV failure. PAPi 1.0. ?-Not sure if there are any options for repair. Need to  allow for renal recovery at this point. ? ?5. CAD/Chest pain ?- trivial CAD on heart cath 2019 ?- No chest pain.  ?- hstrop negative. Doubt ischemic ?  ?6. CKD Stage IV ?- baseline Scr 2-2.5 ?- Scr 2.09>2.46.2.63>2.54>2.45>2.60>2.8 >>2.9>>3.26>>3.29>>3.4>>3.3>3.1>2.9 ?  ?7. DMII ?- per primary  ?- consider SGLT2i as renal function stabilizes ?  ?8. Morbid Obesity  ?- needs weight loss ?- May be candidate for GLP1RA ? ?9. Anemia ?- Baseline 10-12, Hgb 8.8 on 4/16   ?- No bleeding issues.  ? ?10. Confusion/slurred speech/tremor. Stuttering ?- 4/10 Code stroke activated.Neurology consulted.  ?-  MRI - no acute findings. CT unrevealing.  ?- Has not missed any doses of Eliquis since admission. ?- ? Amiodarone contributing to tremor. Amio stopped but added back 04/12 to maintain sinus rhythm. Doubt contributing to stutter. Uncertain etiology. ?- Resolved.   ?- Medications including narcotics, benadryl and duloxetine adjusted by primary team ? ?Disposition: ?PT/OT suggested [facility] PT/OT.  ? ?Okay for discharge later today from HF perspective once receives PM dose of IV lasix. Really wants to leave today. ? ?Has HF f/u scheduled. ? ?HF Medications: ?Amiodarone 200 mg daily ?Eliquis 5 mg BID ?Rosuvastatin 10 mg daily  ?Metoprolol XL 25 mg  daily ?Torsemide 40 mg BID (40 daily prior to admit) ?Potassium chloride 20 mEq daily ?Metolazone 2.5 mg once a week on Saturday (may need to increase frequency at f/u) ?  ?Length of Stay: 15 ? ?FINCH, LINDSAY

## 2021-12-07 NOTE — Progress Notes (Signed)
Mobility Specialist Progress Note: ? ? 12/07/21 1420  ?Mobility  ?Activity Ambulated with assistance in hallway  ?Level of Assistance Standby assist, set-up cues, supervision of patient - no hands on  ?Assistive Device Four wheel walker  ?Distance Ambulated (ft) 200 ft  ?Activity Response Tolerated well  ?$Mobility charge 1 Mobility  ? ?Pt seen after PT stair training session. Pt agreeable to hallway ambulation at this time. No physical assist required throughout, however pt took x2 seated rest breaks d/t fatigue. Pt back in chair with all needs met. ? ?Nelta Numbers ?Acute Rehab ?Phone: [phone] ?Office Phone: [phone] ? ?

## 2021-12-07 NOTE — Progress Notes (Signed)
Discussed with pt HF booklet including daily wts, zones, med importance, low sodium diet, decreased fluids and continued mobility. Pt asking appropriate questions. Gave pt low sodium diets, she has HF booklet at home.  ?1330-1350 ?Yves Dill CES, ACSM ?2:53 PM ?12/07/2021 ? ?

## 2021-12-07 NOTE — Progress Notes (Signed)
Occupational Therapy Treatment ?Patient Details ?Name: [name] ?MRN: [id number] ?DOB: Apr 17, 1956 ?Today's Date: 12/07/2021 ? ? ?History of present illness This 66 y.o. female presenting on 4/3 with SOB and chest pain.  Found with acute on chronic systolic CHF.  Code stroke called 4/10 due to speech difficulties. CT and MRI-negative. 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 ?  ?OT comments ? Patient continues to make steady progress towards goals in skilled OT session. Patient's session encompassed  completion of lower body dressing with AE, and bathing ADL at sink. Patient set up for AE, continuing to improve with proficiency with sock aid. Patient min guard for ADLs, with patient able to engage in sit<>stands to complete bathing without external assist. Patient seated for bathing task to promote increased energy conservation. Patient able to ambulate in room without assistive device to recliner at end of session. OT will continue to follow.   ? ?Recommendations for follow up therapy are one component of a multi-disciplinary discharge planning process, led by the attending physician.  Recommendations may be updated based on patient status, additional functional criteria and insurance authorization. ?   ?Follow Up Recommendations ? Home health OT  ?  ?Assistance Recommended at Discharge Intermittent Supervision/Assistance  ?Patient can return home with the following ? A little help with walking and/or transfers;A little help with bathing/dressing/bathroom;Assistance with cooking/housework;Direct supervision/assist for medications management;Direct supervision/assist for financial management;Help with stairs or ramp for entrance;Assist for transportation ?  ?Equipment Recommendations ? None recommended by OT  ?   ?Recommendations for Other Services   ? ?  ?Precautions / Restrictions Precautions ?Precautions: Fall;Other (comment) ?Precaution Comments: watch HR ?Restrictions ?Weight Bearing Restrictions: No  ? ? ?  ? ?Mobility Bed Mobility ?Overal bed mobility: Needs Assistance ?Bed Mobility: Supine to Sit ?  ?  ?Supine to sit: Supervision, HOB elevated ?  ?  ?  ?  ? ?Transfers ?Overall transfer level: Needs assistance ?Equipment used: None ?Transfers: Sit to/from Stand ?Sit to Stand: Min guard ?  ?  ?  ?  ?  ?General transfer comment: able to complete ambulation in room without DME ?  ?  ?Balance Overall balance assessment: Needs assistance ?Sitting-balance support: Feet supported, No upper extremity supported ?Sitting balance-Leahy Scale: Good ?  ?  ?Standing balance support: During functional activity, No upper extremity supported ?Standing balance-Leahy Scale: Fair ?  ?  ?  ?  ?  ?  ?  ?  ?  ?  ?  ?  ?   ? ?ADL either performed or assessed with clinical judgement  ? ?ADL Overall ADL's : Needs assistance/impaired ?  ?  ?Grooming: Wash/dry hands;Oral Psychologist, sport and exercise;Applying deodorant;Set up;Sitting ?  ?Upper Body Bathing: Set up;Sitting ?  ?Lower Body Bathing: Min guard;Sit to/from stand ?  ?Upper Body Dressing : Set up;Sitting ?  ?Lower Body Dressing: Set up;Sit to/from stand;With adaptive equipment ?Lower Body Dressing Details (indicate cue type and reason): improved usage of AE ?Toilet Transfer: Min guard;Ambulation ?  ?  ?  ?  ?  ?Functional mobility during ADLs: Min guard;Cueing for safety;Cueing for sequencing;Rolling walker (2 wheels) ?General ADL Comments: Patient completing lower body dressing with AE, and completing bathing and dressing ADLs at sink ?  ? ?Extremity/Trunk Assessment   ?  ?  ?  ?  ?  ? ?Vision   ?  ?  ?  Perception   ?  ?Praxis   ?  ? ?Cognition Arousal/Alertness: Awake/alert ?Behavior During Therapy: [facility] for tasks assessed/performed ?Overall Cognitive Status: Within Functional Limits for tasks  assessed ?  ?  ?  ?  ?  ?  ?  ?  ?  ?  ?  ?  ?  ?  ?  ?  ?  ?  ?  ?   ?Exercises   ? ?  ?Shoulder Instructions   ? ? ?  ?General Comments    ? ? ?Pertinent Vitals/ Pain       Pain Assessment ?Pain Assessment: No/denies pain ? ?Home Living   ?  ?  ?  ?  ?  ?  ?  ?  ?  ?  ?  ?  ?  ?  ?  ?  ?  ?  ? ?  ?Prior Functioning/Environment    ?  ?  ?  ?   ? ?Frequency ?    ? ? ? ? ?  ?Progress Toward Goals ? ?OT Goals(current goals can now be found in the care plan section) ? Progress towards OT goals: Progressing toward goals ? ?Acute Rehab OT Goals ?Patient Stated Goal: to go home ?OT Goal Formulation: With patient ?Time For Goal Achievement: 12/20/21 ?Potential to Achieve Goals: Good  ?Plan Discharge plan remains appropriate;Frequency remains appropriate   ? ?Co-evaluation ? ? ?   ?  ?  ?  ?  ? ?  ?AM-PAC OT "6 Clicks" Daily Activity     ?Outcome Measure ? ? Help from another person eating meals?: None ?Help from another person taking care of personal grooming?: None ?Help from another person toileting, which includes using toliet, bedpan, or urinal?: A Little ?Help from another person bathing (including washing, rinsing, drying)?: A Little ?Help from another person to put on and taking off regular upper body clothing?: None ?Help from another person to put on and taking off regular lower body clothing?: None ?6 Click Score: 22 ? ?  ?End of Session   ? ?OT Visit Diagnosis: Other abnormalities of gait and mobility (R26.89);Muscle weakness (generalized) (M62.81) ?  ?Activity Tolerance Patient tolerated treatment well ?  ?Patient Left in chair;with call bell/phone within reach ?  ?Nurse Communication Mobility status ?  ? ?   ? ?Time: 1027-1100 ?OT Time Calculation (min): 33 min ? ?Charges: OT General Charges ?$OT Visit: 1 Visit ?OT Treatments ?$Self Care/Home Management : 23-37 mins ? ?Corinne Ports E. Asaad Gulley, OTR/L ?Acute Rehabilitation Services ?[phone] ?[phone]  ? ?Corinne Ports Dacoda Spallone ?12/07/2021, 12:03 PM ?

## 2021-12-07 NOTE — Progress Notes (Signed)
Physical Therapy Treatment ?Patient Details ?Name: [name] ?MRN: [id number] ?DOB: 1955/12/18 ?Today's Date: 12/07/2021 ? ? ?History of Present Illness This 66 y.o. female presenting on 4/3 with SOB and chest pain.  Found with acute on chronic systolic CHF.  Code stroke called 4/10 due to speech difficulties. CT and MRI-negative. 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 ? ?  ?PT Comments  ? ? Session focused on gait and stair training. Pt was able to navigate x3 stairs using bil rails to ascend and progressing to 1 handrail to descend at a min guard assist level without LOB. Pt was also able to display improved balance through being able to ambulate short distances in the room without UE support. Pt verbalized no questions prior to discharging home. Will continue to follow acutely. Current recommendations remain appropriate. ?   ?Recommendations for follow up therapy are one component of a multi-disciplinary discharge planning process, led by the attending physician.  Recommendations may be updated based on patient status, additional functional criteria and insurance authorization. ? ?Follow Up Recommendations ? Home health PT ?  ?  ?Assistance Recommended at Discharge Intermittent Supervision/Assistance  ?Patient can return home with the following A little help with bathing/dressing/bathroom;Help with stairs or ramp for entrance;Assist for transportation;Assistance with cooking/housework;A little help with walking and/or transfers ?  ?Equipment Recommendations ? None recommended by PT  ?  ?Recommendations for Other Services   ? ? ?  ?Precautions / Restrictions Precautions ?Precautions: Fall;Other (comment) ?Precaution Comments: watch HR ?Restrictions ?Weight Bearing Restrictions: No  ?  ? ?Mobility ? Bed Mobility ?  ?  ?   ?  ?  ?  ?  ?General bed mobility comments: Pt sitting in chair upon arrival. ?  ? ?Transfers ?Overall transfer level: Needs assistance ?Equipment used: Rollator (4 wheels), None ?Transfers: Sit to/from Stand ?Sit to Stand: Supervision ?  ?  ?  ?  ?  ?General transfer comment: Supervision for safety coming to stand from chair to rollator and from commode to no device, no LOB. ?  ? ?Ambulation/Gait ?Ambulation/Gait assistance: Min guard, Supervision ?Gait Distance (Feet): 110 Feet (x2 bouts of ~20 ft > ~110 ft) ?Assistive device: Rollator (4 wheels), None ?Gait Pattern/deviations: Step-through pattern, Decreased stride length, Trunk flexed ?Gait velocity: reduced ?Gait velocity interpretation: 1.31 - 2.62 ft/sec, indicative of limited community ambulator ?  ?General Gait Details: Slow, steady gait with rollator majority of time but intermittently ambulates short distances in room without UE support. No LOB, min guard-supervision for safety. ? ? ?Stairs ?Stairs: Yes ?Stairs assistance: Min guard ?Stair Management: Two rails, One rail Left, Step to pattern, Forwards ?Number of Stairs: 3 ?General stair comments: Ascends with bil rails and descends with bil rails initially but quickly progressing to only using L rail. No LOB, min guard for safety. ? ? ?Wheelchair Mobility ?  ? ?Modified Rankin (Stroke Patients Only) ?  ? ? ?  ?Balance Overall balance assessment: Needs assistance ?Sitting-balance support: Feet supported, No upper extremity supported ?Sitting balance-Leahy Scale: Good ?  ?  ?Standing balance support: No upper extremity supported, Single extremity supported, Bilateral upper extremity supported, During functional activity ?Standing balance-Leahy Scale: Fair ?Standing balance comment: Able to take a few steps without UE support but benefits from UE support with higher dynamic challenges. ?  ?  ?  ?  ?  ?  ?  ?  ?  ?  ?  ?  ? ?  ?  Cognition Arousal/Alertness: Awake/alert ?Behavior During Therapy: [facility] for tasks  assessed/performed ?Overall Cognitive Status: Within Functional Limits for tasks assessed ?  ?  ?  ?  ?  ?  ?  ?  ?  ?  ?  ?  ?  ?  ?  ?  ?  ?  ?  ? ?  ?Exercises   ? ?  ?General Comments   ?  ?  ? ?Pertinent Vitals/Pain Pain Assessment ?Pain Assessment: Faces ?Faces Pain Scale: No hurt ?Pain Intervention(s): Monitored during session  ? ? ?Home Living   ?  ?  ?  ?  ?  ?  ?  ?  ?  ?   ?  ?Prior Function    ?  ?  ?   ? ?PT Goals (current goals can now be found in the care plan section) Acute Rehab PT Goals ?Patient Stated Goal: to go home ?PT Goal Formulation: With patient ?Time For Goal Achievement: 12/06/21 ?Potential to Achieve Goals: Good ?Progress towards PT goals: Progressing toward goals ? ?  ?Frequency ? ? ? Min 3X/week ? ? ? ?  ?PT Plan Current plan remains appropriate  ? ? ?Co-evaluation   ?  ?  ?  ?  ? ?  ?AM-PAC PT "6 Clicks" Mobility   ?Outcome Measure ? Help needed turning from your back to your side while in a flat bed without using bedrails?: A Little ?Help needed moving from lying on your back to sitting on the side of a flat bed without using bedrails?: A Little ?Help needed moving to and from a bed to a chair (including a wheelchair)?: A Little ?Help needed standing up from a chair using your arms (e.g., wheelchair or bedside chair)?: A Little ?Help needed to walk in hospital room?: A Little ?Help needed climbing 3-5 steps with a railing? : A Little ?6 Click Score: 18 ? ?  ?End of Session   ?Activity Tolerance: Patient tolerated treatment well ?Patient left: in chair;with call bell/phone within reach ?Nurse Communication: Mobility status ?PT Visit Diagnosis: Other abnormalities of gait and mobility (R26.89);Unsteadiness on feet (R26.81);Muscle weakness (generalized) (M62.81) ?  ? ? ?Time: 9629-5284 ?PT Time Calculation (min) (ACUTE ONLY): 9 min ? ?Charges:  $Gait Training: 8-22 mins          ?          ? ?Moishe Spice, PT, DPT ?Acute Rehabilitation Services  ?Pager: [phone] ?Office:  [phone] ? ? ? ?[name] ?12/07/2021, 3:50 PM ? ?

## 2021-12-07 NOTE — Progress Notes (Signed)
Mobility Specialist Progress Note: ? ? 12/07/21 1120  ?Mobility  ?Activity  ?(chair-level exercises)  ?Level of Assistance Independent  ?Assistive Device None  ?Activity Response Tolerated well  ?$Mobility charge 1 Mobility  ? ?Pt agreeable to chair level exercises this am. Pt tolerated well. Will f/u for hallway ambulation this afternoon. Left in chair with all needs met. ? ?Nelta Numbers ?Acute Rehab ?Phone: [phone] ?Office Phone: [phone] ? ?

## 2021-12-07 NOTE — Progress Notes (Signed)
Patient given discharge instructions and stated understanding. 

## 2021-12-07 NOTE — Plan of Care (Signed)
  Problem: Education: Goal: Ability to demonstrate management of disease process will improve Outcome: Progressing Goal: Ability to verbalize understanding of medication therapies will improve Outcome: Progressing   

## 2021-12-08 ENCOUNTER — Telehealth: Payer: Self-pay

## 2021-12-08 MED ORDER — COLCHICINE 0.6 MG PO TABS: 0.3000 mg | ORAL_TABLET | Freq: Every day | ORAL | 0 refills | Status: DC

## 2021-12-08 NOTE — Telephone Encounter (Signed)
Rx sent. Thanks for all you do! ?

## 2021-12-08 NOTE — Telephone Encounter (Signed)
Transition Care Management Follow-up Telephone Call ?Date of discharge and from where: 12/07/2021, [facility] ?How have you been since you were released from the hospital? She said she is feeling pretty good, doing fine.  ?Any questions or concerns? Yes- She said she has been nervous and needs something for her nerves.  ?She has "graduated" from the [employer] program but would like to be referred back if possible.  ? ?Items Reviewed: ?Did the pt receive and understand the discharge instructions provided? Yes  ?Medications obtained and verified?  She has not picked up the new medications yet and will need to check with the pharmacy to see if they are ready. She has all of the other medications except colchicine. She also has a working glucometer.   ?Other? No  ?Any new allergies since your discharge? No  ?Dietary orders reviewed? Yes ?Do you have support at home? Yes  - she lives with her daughter  ? ?Home Care and Equipment/Supplies: ?Were home health services ordered? yes ?If so, what is the name of the agency? [facility]  ?Has the agency set up a time to come to the patient's home? Yes - Mon 12/12/2021.  ?Were any new equipment or medical supplies ordered?  No ?What is the name of the medical supply agency? N/a ?Were you able to get the supplies/equipment? not applicable ?Do you have any questions related to the use of the equipment or supplies? No ? ?Functional Questionnaire: (I = Independent and D = Dependent) ?ADLs: She stated that she is independent. Ambulates without an assistive device and does not have a RW.  ? ?Follow up appointments reviewed: ? ?PCP Hospital f/u appt confirmed? Yes  Scheduled to see Juluis Mire, NP - 12/29/2021. ?[facility] f/u appt confirmed? Yes  Scheduled to see [place] - 12/14/2021, cardiology  - 12/19/1021.   ?Are transportation arrangements needed? No  ?If their condition worsens, is the pt aware to call PCP or go to the Emergency Dept.?  Yes ?Was the patient provided with contact information for the PCP's office or ED? Yes ?Was to pt encouraged to call back with questions or concerns? Yes ? ?

## 2021-12-12 NOTE — Telephone Encounter (Signed)
Call placed to patient and informed her that the prescription for colchicine was sent to her pharmacy ?

## 2021-12-14 ENCOUNTER — Encounter (HOSPITAL_COMMUNITY): Payer: Self-pay | Admitting: Internal Medicine

## 2021-12-14 ENCOUNTER — Ambulatory Visit (HOSPITAL_COMMUNITY)
Admission: RE | Admit: 2021-12-14 | Discharge: 2021-12-14 | Disposition: A | Discharge status: Home / Self Care | Payer: 59 | Source: Ambulatory Visit | Attending: Internal Medicine | Admitting: Internal Medicine

## 2021-12-14 VITALS — BP 116/78 | HR 84 | Wt 286.2 lb

## 2021-12-14 DIAGNOSIS — I5042 Chronic combined systolic (congestive) and diastolic (congestive) heart failure: Secondary | ICD-10-CM | POA: Diagnosis present

## 2021-12-14 DIAGNOSIS — I4821 Permanent atrial fibrillation: Secondary | ICD-10-CM

## 2021-12-14 DIAGNOSIS — I480 Paroxysmal atrial fibrillation: Secondary | ICD-10-CM

## 2021-12-14 DIAGNOSIS — Z9889 Other specified postprocedural states: Secondary | ICD-10-CM

## 2021-12-14 DIAGNOSIS — G4733 Obstructive sleep apnea (adult) (pediatric): Secondary | ICD-10-CM | POA: Diagnosis not present

## 2021-12-14 LAB — COMPREHENSIVE METABOLIC PANEL
ALT: 18 U/L (ref 0–44)
AST: 24 U/L (ref 15–41)
Albumin: 3.6 g/dL (ref 3.5–5.0)
Alkaline Phosphatase: 136 U/L — ABNORMAL HIGH (ref 38–126)
Anion gap: 7 (ref 5–15)
BUN: 41 mg/dL — ABNORMAL HIGH (ref 8–23)
CO2: 34 mmol/L — ABNORMAL HIGH (ref 22–32)
Calcium: 9.6 mg/dL (ref 8.9–10.3)
Chloride: 101 mmol/L (ref 98–111)
Creatinine, Ser: 2.96 mg/dL — ABNORMAL HIGH (ref 0.44–1.00)
GFR, Estimated: 17 mL/min — ABNORMAL LOW (ref >60)
Glucose, Bld: 112 mg/dL — ABNORMAL HIGH (ref 70–99)
Potassium: 3.1 mmol/L — ABNORMAL LOW (ref 3.5–5.1)
Sodium: 142 mmol/L (ref 135–145)
Total Bilirubin: 0.6 mg/dL (ref 0.3–1.2)
Total Protein: 7.9 g/dL (ref 6.5–8.1)

## 2021-12-14 LAB — BRAIN NATRIURETIC PEPTIDE: B Natriuretic Peptide: 184.4 pg/mL — ABNORMAL HIGH (ref 0.0–100.0)

## 2021-12-14 NOTE — Patient Instructions (Signed)
Medication Changes: ? ?None, continue current medications ? ?Lab Work: ? ?Labs done today, your results will be available in MyChart, we will contact you for abnormal readings. ? ?Testing/Procedures: ? ?None ? ?Referrals: ? ?You have been referred to follow up with Dr Quentin Ore, his office will call you for an appointment ? ?You have been referred to Dr Radford Pax for your sleep apnea, her office will call you for an appointment ? ?Special Instructions // Education: ? ?Do the following things EVERYDAY: ?Weigh yourself in the morning before breakfast. Write it down and keep it in a log. ?Take your medicines as prescribed ?Eat low salt foods--Limit salt (sodium) to 2000 mg per day.  ?Stay as active as you can everyday ?Limit all fluids for the day to less than 2 liters ? ? ?Follow-Up in: 6 weeks ? ?At the [facility], you and your health needs are our priority. We have a designated team specialized in the treatment of Heart Failure. This Care Team includes your primary Heart Failure Specialized Cardiologist (physician), Advanced Practice Providers (APPs- Physician Assistants and Nurse Practitioners), and Pharmacist who all work together to provide you with the care you need, when you need it.  ? ?You may see any of the following providers on your designated Care Team at your next follow up: ? ?Dr Glori Bickers ?Dr Loralie Champagne ?Darrick Grinder, NP ?Lyda Jester, PA ?Jessica Milford,NP ?Marlyce Huge, PA ?Audry Riles, PharmD ? ? ?Please be sure to bring in all your medications bottles to every appointment.  ? ?Need to Contact us: ? ?If you have any questions or concerns before your next appointment please send [place] a message through [place] or call our office at [phone].   ? ?TO LEAVE A MESSAGE FOR THE NURSE SELECT OPTION 2, PLEASE LEAVE A MESSAGE INCLUDING: ?YOUR NAME ?DATE OF BIRTH ?CALL BACK NUMBER ?REASON FOR CALL**this is important as we prioritize the call backs ? ?YOU WILL RECEIVE A CALL BACK  THE SAME DAY AS LONG AS YOU CALL BEFORE 4:00 PM ? ? ? ?

## 2021-12-14 NOTE — Progress Notes (Signed)
?Advanced Heart Failure Clinic Note  ?  ?PCP: [name], NP ?PCP-Cardiologist: Buford Dresser, MD  ?[facility]: Dr. [name]  ? ?Reason for Visit: Heart Failure F/u ? ?HPI: ?[name] is a 66 y.o. with a history of morbid obesity, chronic diastolic and systolic HF with mildly reduced LVEF (45-50%), severe MR/TR, atrial fibrillation on Eliquis, HTN, and DMII.  ?  ?She has a known history of HF with mildly reduced LVEF of 45-50% with cath in 2019 with mild, non-obstructive CAD. Also with history of difficult to control Afib with failed Tikosyn, later switched to amiodarone, dig, metop and apixaban for [facility].  ?  ?Admitted 5//22 with worsening HF.  LHC 5/19 non-obstructive CAD. RHC 12/22/20 with elevated filling pressures and low CI.  ?  ?Underwent MV/TV repair with [facility] on 12/30/20. Post-operatively, she struggled  with low output progressive renal failure and volume overload.  Post operative Echo showed LVEF 55% RV moderately HK. Moderate TR. No significant MR. D/c wt was 281 lb. SCr was 2.8. She was also in Afib day of d/c.  ?  ?Admitted 6/22 for A/CHF and AF diuresed with IV lasix, RHC showed low filling pressures and moderately reduced CO. She underwent successful DCCV 02/01/21. 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  ?  ?Seen in ED 02/07/21 fShe was back in Afib, rate controlled. She was loaded w amiodarone gtt, successful DCCV on 02/09/21.  ?  ?Back in AF 7/22. Repeat echo showed new reduction in EF, in setting of AFL with RVR,  EF 30-35%, moderate LVH, moderate RV dysfunction. Transitioned to po amio and torsemide, discharge weight 271 lbs. ?  ?S/p DCCV 04/19/21, but back in afib at follow up a week later at [facility] appointment.  ? ?Admitted 9/22 with CP and AKI on CKD IV. SCr 3.12 in ED, likely due to over diuresis. discharge, weight 268 lbs. ? ?Admitted 1/23 x 2  with respiratory distress due to HF and COVID, Had recurrent AF. Echo EF 45% ? ?Admitted 4/23 with recurrent HF in setting of recurrent  AF. Echo 35-40% RV moderately down severe TR. [place] 11/24/21 notable for severely elevated biventricular pressures. Started on milrinone and IV lasix.  ? ?Underwent DC-CV to NSR on 11/25/21. Loaded with IV amio. Developed severe stuttering. Code Stroke called. CT/MRI normal. Resolved spontaneously. Developed AKI. Discharged home 12/07/21. Scr 2.9. Wt 295 ? ? ?RA = 29 ?RV = 65/30 ?PA = 67/37 (45) ?PCW = 30 (v waves to 45) ?Fick cardiac output/index = 6.2/2.5 ?PVR = 2.4 WU ?Ao sat = 93% ?PA sat = 50%, 55% ?PAPi = 1.0  ? ?Here for post-hospital f/u. Says she feels some  Taking torsemide 20 bid. Taking metolazone 2.5 MWF ? ? ?Review of systems complete and found to be negative unless listed in HPI.   ? ?Cardiac Studies ?- Echo (11/13): EF 60-65%, moderate TR ?- Echo (11/14): EF 60-65%, severe TR, PA peak pressure 47 mmHg ?- Echo (5/15): EF 50-55% ?- Exercise stress test (7/6): A. Fib. with controlled vent. response OLD inferior MI and non sp. T wave ?changes at baseline. ?- Echo (8/17): EF 50-55%, moderate MR ?- Exercise stress test (8/17): Atrial Fib. Q wave in lead 3 and AVF with nonsp T wave changes at baseline ?- Echo (5/19): EF 55-60%, moderate MR ?- Exercise stress test (5/19): Afib, normal. ?- LHC (5/19): Prox LAD lesion is 10% stenosed, Dist LM to Ost LAD lesion is 10% stenosed, Ost Cx lesion is 15% stenosed, LV systolic function normal,  LVEDP is normal. ?- Echo (11/19): EF 45-50%, severe MR/TR ?- Echo (11/21): EF 45-50%, severe MR (rheumatic)/TR ?- Echo (4/22): EF 45-50%, severe MR mean gradient 2.0 mmHg, severe TR ?- Echo (5/22): EF 60-65%, s/p MVR, trace MR mean gradient 7.0 mmHg, moderate TR ?- RHC (5/22): Elevated R > L heart filling pressures with PAPi low but not markedly so (1.8) suggestive of a significant component of RV dysfunction. Prominent v-waves in PCWP and RA pressure tracings suggestive of significant MR and TR. Cardiac index low. ?- RHC (6/22): low filling pressures, moderate reduced CO. ?- Echo  (7/22): EF 30-35%, moderate LVH, moderate RV dysfunction, PASP 48, s/p MV repair with mild MR and mean gradient 10 mmHg with HR around 130, s/p TV repair with mild-moderate TR, dilated IVC. ?  ?[place] 01/31/21  ?RA = 5 ?RV = 33/2 ?PA = 35/13 (22) ?PCW = 13 ?Fick cardiac output/index = 4.3/2.0 ?PVR = 2.1 WU ?FA sat = 97% ?PA sat = 54%, 54% ?- DCCV (02/01/21): successful conversion to NSR. ?- DCCV (02/09/21): successful conversion to NSR. ?-DCCV (830/22): successful conversion to NSR.  ?ROS: All systems negative except as listed in HPI, PMH and Problem List. ? ?SH:  ?Social History  ? ?Socioeconomic History  ? Marital status: Significant Other  ?  Spouse name: Not on file  ? Number of children: 2  ? Years of education: Not on file  ? Highest education level: Some college, no degree  ?Occupational History  ? Not on file  ?Tobacco Use  ? Smoking status: Never  ? Smokeless tobacco: Never  ?Vaping Use  ? Vaping Use: Never used  ?Substance and Sexual Activity  ? Alcohol use: No  ? Drug use: No  ? Sexual activity: Not Currently  ?Other Topics Concern  ? Not on file  ?Social History Narrative  ? Pt lives in [place] with spouse.  2 grown children.  ? Previously worked in [employer] at [employer].  Now on disability  ? ?Social Determinants of Health  ? ?Financial Resource Strain: Low Risk   ? Difficulty of Paying Living Expenses: Not hard at all  ?Food Insecurity: No Food Insecurity  ? Worried About Charity fundraiser in the Last Year: Never true  ? Ran Out of Food in the Last Year: Never true  ?Transportation Needs: No Transportation Needs  ? Lack of Transportation (Medical): No  ? Lack of Transportation (Non-Medical): No  ?Physical Activity: Insufficiently Active  ? Days of Exercise per Week: 2 days  ? Minutes of Exercise per Session: 70 min  ?Stress: No Stress Concern Present  ? Feeling of Stress : Not at all  ?Social Connections: Socially Isolated  ? Frequency of Communication with Friends and Family: More than three times a week   ? Frequency of Social Gatherings with Friends and Family: Three times a week  ? Attends Religious Services: Never  ? Active Member of Clubs or Organizations: No  ? Attends Archivist Meetings: Never  ? Marital Status: Widowed  ?Intimate Partner Violence: Not At Risk  ? Fear of Current or Ex-Partner: No  ? Emotionally Abused: No  ? Physically Abused: No  ? Sexually Abused: No  ? ?FH:  ?Family History  ?Problem Relation Age of Onset  ? Cancer Mother   ? Hypertension Mother   ? Cancer Father   ? CAD Other   ? Hypertension Sister   ? Hypertension Brother   ? ?Past Medical History:  ?Diagnosis Date  ? Acute  on chronic systolic CHF (congestive heart failure) ([place]) 12/21/2020  ? Allergic rhinitis   ? Arthritis   ? Asthma   ? Chest pain 12/27/2017  ? Chronic diastolic CHF (congestive heart failure) ([place])   ? COPD (chronic obstructive pulmonary disease) ([place])   ? Depression   ? DM (diabetes mellitus) ([place])   ? DVT (deep vein thrombosis) in pregnancy   ? Dysrhythmia   ? atrial fibrilation  ? GERD (gastroesophageal reflux disease)   ? Headache(784.0)   ? HTN (hypertension)   ? Hyperlipidemia   ? Obesity   ? Pneumonia 04/2018  ? RIGHT LOBE  ? Shortness of breath   ? Sleep apnea   ? compliant with CPAP  ? ?Current Outpatient Medications  ?Medication Sig Dispense Refill  ? acetaminophen (TYLENOL) 325 MG tablet Take 2 tablets (650 mg total) by mouth every 6 (six) hours as needed for mild pain or moderate pain.    ? albuterol (VENTOLIN HFA) 108 (90 Base) MCG/ACT inhaler INHALE 2 PUFFS BY MOUTH EVERY 6 HOURS AS NEEDED (Patient taking differently: Inhale 2 puffs into the lungs every 6 (six) hours as needed for wheezing or shortness of breath.) 6.7 each 1  ? Alcohol Swabs (B-D SINGLE USE SWABS REGULAR) PADS 1 each by Other route daily. 100 each 11  ? Alcohol Swabs PADS 1 application by Does not apply route in the morning, at noon, and at bedtime. 120 each 11  ? amiodarone (PACERONE) 200 MG tablet Take 1 tablet (200 mg total)  by mouth daily. 90 tablet 3  ? apixaban (ELIQUIS) 5 MG TABS tablet Take 1 tablet (5 mg total) by mouth 2 (two) times daily. 180 tablet 3  ? blood glucose meter kit and supplies KIT Dispense based on patie

## 2021-12-16 ENCOUNTER — Other Ambulatory Visit (INDEPENDENT_AMBULATORY_CARE_PROVIDER_SITE_OTHER): Payer: Self-pay | Admitting: Primary Care

## 2021-12-16 ENCOUNTER — Other Ambulatory Visit: Payer: Self-pay | Admitting: Primary Care

## 2021-12-16 DIAGNOSIS — K3184 Gastroparesis: Secondary | ICD-10-CM

## 2021-12-16 DIAGNOSIS — Z1231 Encounter for screening mammogram for malignant neoplasm of breast: Secondary | ICD-10-CM

## 2021-12-19 ENCOUNTER — Telehealth (HOSPITAL_COMMUNITY): Payer: Self-pay

## 2021-12-19 ENCOUNTER — Ambulatory Visit (INDEPENDENT_AMBULATORY_CARE_PROVIDER_SITE_OTHER): Payer: 59 | Admitting: Cardiology

## 2021-12-19 VITALS — BP 146/72 | HR 88 | Ht 67.0 in | Wt 289.9 lb

## 2021-12-19 DIAGNOSIS — I484 Atypical atrial flutter: Secondary | ICD-10-CM

## 2021-12-19 DIAGNOSIS — I5042 Chronic combined systolic (congestive) and diastolic (congestive) heart failure: Secondary | ICD-10-CM | POA: Diagnosis not present

## 2021-12-19 DIAGNOSIS — Z9889 Other specified postprocedural states: Secondary | ICD-10-CM

## 2021-12-19 DIAGNOSIS — I480 Paroxysmal atrial fibrillation: Secondary | ICD-10-CM | POA: Diagnosis not present

## 2021-12-19 DIAGNOSIS — I428 Other cardiomyopathies: Secondary | ICD-10-CM

## 2021-12-19 MED ORDER — POTASSIUM CHLORIDE CRYS ER 20 MEQ PO TBCR: 40.0000 meq | EXTENDED_RELEASE_TABLET | Freq: Every day | ORAL | 3 refills | Status: DC

## 2021-12-19 NOTE — Telephone Encounter (Addendum)
Pt aware, agreeable, and verbalized understanding ? ? ?----- Message from Jolaine Artist, MD sent at 12/16/2021  2:17 PM EDT ----- ?Double potassium. Recheck 2 weeks.  ?

## 2021-12-19 NOTE — Progress Notes (Signed)
?Cardiology Office Note:   ? ?Date:  12/26/2021  ? ?ID:  [name], DOB November 28, 1955, MRN [id number] ? ?PCP:  [name], NP  ?Cardiologist:  Buford Dresser, MD ? ?Referring MD: [name], NP  ? ?CC: follow up ? ?History of Present Illness:   ? ?[name] is a 66 y.o. female with a hx of atrial fibrillation, chronic systolic and diastolic heart failure, type II diabetes who is seen for hospital follow up today. I initially saw her 03/31/20 as a new consult at the request of [name], NP for the evaluation and management of atrial fibrillation, heart failure. ? ?Today: ?Has been out of rhythm since shortly after she saw Dr. [name], about 5 days or so. Her last cardioversion and amiodarone held her about 2 weeks. She is starting to feel poorly, more short of breath but no LE edema yet. We discussed options at length. She believes Dr. [name] mentioned a procedure in [place] possibly (?TV clip). She is frustrated and not sure what to do about her rhythm. She would rather not do another cardioversion so soon, but she also does not want to come back in the hospital. We reviewed options at length, see below. ? ?Denies chest pain. No PND, orthopnea, LE edema or unexpected weight gain. No syncope or palpitations.  ? ? ?Past Medical History:  ?Diagnosis Date  ? Acute on chronic systolic CHF (congestive heart failure) ([place]) 12/21/2020  ? Allergic rhinitis   ? Arthritis   ? Asthma   ? Chest pain 12/27/2017  ? Chronic diastolic CHF (congestive heart failure) ([place])   ? COPD (chronic obstructive pulmonary disease) ([place])   ? Depression   ? DM (diabetes mellitus) ([place])   ? DVT (deep vein thrombosis) in pregnancy   ? Dysrhythmia   ? atrial fibrilation  ? GERD (gastroesophageal reflux disease)   ? Headache(784.0)   ? HTN (hypertension)   ? Hyperlipidemia   ? Obesity   ? Pneumonia 04/2018  ? RIGHT LOBE  ? Shortness of breath   ? Sleep apnea   ? compliant with CPAP  ? ? ?Past Surgical  History:  ?Procedure Laterality Date  ? ABDOMINAL HYSTERECTOMY    ? BUBBLE STUDY  11/26/2020  ? Procedure: BUBBLE STUDY;  Surgeon: Buford Dresser, MD;  Location: [place];  Service: Cardiovascular;;  ? CARDIOVERSION N/A 05/06/2020  ? Procedure: CARDIOVERSION;  Surgeon: Buford Dresser, MD;  Location: [place];  Service: Cardiovascular;  Laterality: N/A;  ? CARDIOVERSION N/A 11/04/2020  ? Procedure: CARDIOVERSION;  Surgeon: Lelon Perla, MD;  Location: [place];  Service: Cardiovascular;  Laterality: N/A;  ? CARDIOVERSION N/A 02/01/2021  ? Procedure: CARDIOVERSION;  Surgeon: Jolaine Artist, MD;  Location: [place];  Service: Cardiovascular;  Laterality: N/A;  ? CARDIOVERSION N/A 02/09/2021  ? Procedure: CARDIOVERSION;  Surgeon: Jolaine Artist, MD;  Location: [place];  Service: Cardiovascular;  Laterality: N/A;  ? CARDIOVERSION N/A 04/19/2021  ? Procedure: CARDIOVERSION;  Surgeon: Fay Records, MD;  Location: [place];  Service: Cardiovascular;  Laterality: N/A;  ? CARDIOVERSION N/A 11/25/2021  ? Procedure: CARDIOVERSION;  Surgeon: Jolaine Artist, MD;  Location[facility] ENDOSCOPY;  Service: Cardiovascular;  Laterality: N/A;  ? CATARACT EXTRACTION, BILATERAL    ? CHOLECYSTECTOMY    ? COLONOSCOPY WITH PROPOFOL N/A 03/05/2015  ? Procedure: COLONOSCOPY WITH PROPOFOL;  Surgeon: Carol Ada, MD;  Location: [facility] ENDOSCOPY;  Service: Endoscopy;  Laterality: N/A;  ? DIAGNOSTIC LAPAROSCOPY    ?  ingrown hallux Left   ? KNEE SURGERY    ? LEFT HEART CATH AND CORONARY ANGIOGRAPHY N/A 12/31/2017  ? Procedure: LEFT HEART CATH AND CORONARY ANGIOGRAPHY;  Surgeon: Charolette Forward, MD;  Location: [place] CV LAB;  Service: Cardiovascular;  Laterality: N/A;  ? MAZE N/A 12/29/2020  ? Procedure: MAZE;  Surgeon: [name], MD;  Location: [place];  Service: Open Heart Surgery;  Laterality: N/A;  ? MITRAL VALVE REPAIR N/A 12/29/2020  ? Procedure: MITRAL VALVE REPAIR USING CARBOMEDICS  ANNULOFLEX [place] SIZE 30;  Surgeon: [name], MD;  Location: [place];  Service: Open Heart Surgery;  Laterality: N/A;  ? RIGHT HEART CATH N/A 12/22/2020  ? Procedure: RIGHT HEART CATH;  Surgeon: Larey Dresser, MD;  Location: [place] CV LAB;  Service: Cardiovascular;  Laterality: N/A;  ? RIGHT HEART CATH N/A 01/31/2021  ? Procedure: RIGHT HEART CATH;  Surgeon: Jolaine Artist, MD;  Location: [place] CV LAB;  Service: Cardiovascular;  Laterality: N/A;  ? RIGHT HEART CATH N/A 07/29/2021  ? Procedure: RIGHT HEART CATH;  Surgeon: Jolaine Artist, MD;  Location: [place] CV LAB;  Service: Cardiovascular;  Laterality: N/A;  ? RIGHT HEART CATH N/A 11/24/2021  ? Procedure: RIGHT HEART CATH;  Surgeon: Jolaine Artist, MD;  Location: [place] CV LAB;  Service: Cardiovascular;  Laterality: N/A;  ? TEE WITHOUT CARDIOVERSION N/A 11/26/2020  ? Procedure: TRANSESOPHAGEAL ECHOCARDIOGRAM (TEE);  Surgeon: Buford Dresser, MD;  Location: [place];  Service: Cardiovascular;  Laterality: N/A;  ? TEE WITHOUT CARDIOVERSION N/A 12/29/2020  ? Procedure: TRANSESOPHAGEAL ECHOCARDIOGRAM (TEE);  Surgeon: [name], MD;  Location: [place];  Service: Open Heart Surgery;  Laterality: N/A;  ? TRICUSPID VALVE REPLACEMENT N/A 12/29/2020  ? Procedure: 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 RING SIZE 34;  Surgeon: [name], MD;  Location: [place];  Service: Open Heart Surgery;  Laterality: N/A;  ? ? ?Current Medications: ?Current Outpatient Medications on File Prior to Visit  ?Medication Sig  ? acetaminophen (TYLENOL) 325 MG tablet Take 2 tablets (650 mg total) by mouth every 6 (six) hours as needed for mild pain or moderate pain.  ? albuterol (VENTOLIN HFA) 108 (90 Base) MCG/ACT inhaler INHALE 2 PUFFS BY MOUTH EVERY 6 HOURS AS NEEDED (Patient taking differently: Inhale 2 puffs into the lungs every 6 (six) hours as needed for wheezing or shortness of breath.)  ? Alcohol Swabs (B-D  SINGLE USE SWABS REGULAR) PADS 1 each by Other route daily.  ? Alcohol Swabs PADS 1 application by Does not apply route in the morning, at noon, and at bedtime.  ? amiodarone (PACERONE) 200 MG tablet Take 1 tablet (200 mg total) by mouth daily.  ? apixaban (ELIQUIS) 5 MG TABS tablet Take 1 tablet (5 mg total) by mouth 2 (two) times daily.  ? blood glucose meter kit and supplies KIT Dispense based on patient and insurance preference. Use up to four times daily as directed.  ? blood glucose meter kit and supplies Dispense based on patient and insurance preference. Use up to four times daily as directed. (FOR ICD-10 E10.9, E11.9).  ? Blood Glucose Monitoring Suppl (ACCU-CHEK GUIDE ME) w/Device KIT Use as instructed to check blood sugar three times daily. E11.69  ? busPIRone (BUSPAR) 5 MG tablet Take 1 tablet (5 mg total) by mouth 2 (two) times daily.  ? colchicine 0.6 MG tablet Take 0.5 tablets (0.3 mg total) by mouth daily.  ? docusate sodium (  COLACE) 100 MG capsule Take 100 mg by mouth 2 (two) times daily.  ? Dulaglutide (TRULICITY) 1.5 QQ/7.6PP SOPN Inject 1.5 mg into the skin once a week. (Patient taking differently: Inject 1.5 mg into the skin every Wednesday.)  ? DULoxetine (CYMBALTA) 60 MG capsule Take 1 capsule (60 mg total) by mouth daily.  ? EPINEPHrine 0.3 mg/0.3 mL IJ SOAJ injection Inject 0.3 mg into the muscle as needed for anaphylaxis.  ? fluticasone (FLONASE) 50 MCG/ACT nasal spray Place 2 sprays into both nostrils daily.  ? glucose blood (ACCU-CHEK GUIDE) test strip Use to check blood sugar TID. E11.69  ? insulin aspart (NOVOLOG) 100 UNIT/ML injection Inject 2-10 Units into the skin 3 (three) times daily before meals. Per sliding scale  ? lidocaine (XYLOCAINE) 5 % ointment Apply 1 application. topically as needed for moderate pain. First choice is lidocaine patch, but if not covered/expensive, please fill ointment (Patient taking differently: Apply 1 application. topically 2 (two) times daily as needed  for moderate pain.)  ? loratadine (CLARITIN) 10 MG tablet Take 1 tablet (10 mg total) by mouth daily.  ? metolazone (ZAROXOLYN) 2.5 MG tablet Patient takes 1 tablet by mouth on Monday Wednesday and Friday.  ? ME

## 2021-12-19 NOTE — Patient Instructions (Signed)
Medication Instructions:  ?Increase the torsemide to 40 mg three times a day for three days, then return to 40 mg twice daily. ? ?*If you need a refill on your cardiac medications before your next appointment, please call your pharmacy* ? ? ?Lab Work: ?None ordered today ? ? ?Testing/Procedures: ?None ordered today ? ? ?Follow-Up: ?At [facility], you and your health needs are our priority.  As part of our continuing mission to provide you with exceptional heart care, we have created designated Provider Care Teams.  These Care Teams include your primary Cardiologist (physician) and Advanced Practice Providers (APPs -  Physician Assistants and Nurse Practitioners) who all work together to provide you with the care you need, when you need it. ? ?We recommend signing up for the patient portal called "MyChart".  Sign up information is provided on this After Visit Summary.  MyChart is used to connect with patients for Virtual Visits (Telemedicine).  Patients are able to view lab/test results, encounter notes, upcoming appointments, etc.  Non-urgent messages can be sent to your provider as well.   ?To learn more about what you can do with MyChart, go to [email or web address].   ? ?Your next appointment:   ?2 week(s) ? ?The format for your next appointment:   ?In Person ? ?Provider:   ?Buford Dresser, MD{ ? ?I will reach out to Dr. [name] and Dr. Quentin Ore. If we cannot get you in to see Dr. Quentin Ore ASAP, then I would plan to do another cardioversion to try to hold you for a few weeks. ? ?We will see if we can get you a blood pressure cuff.  ? ?Important Information About Sugar ? ? ? ? ? ? ? ? ?

## 2021-12-23 ENCOUNTER — Ambulatory Visit
Admission: RE | Admit: 2021-12-23 | Discharge: 2021-12-23 | Disposition: A | Discharge status: Home / Self Care | Payer: 59 | Source: Ambulatory Visit | Attending: Primary Care | Admitting: Primary Care

## 2021-12-23 DIAGNOSIS — Z1231 Encounter for screening mammogram for malignant neoplasm of breast: Secondary | ICD-10-CM

## 2021-12-26 ENCOUNTER — Other Ambulatory Visit: Payer: Self-pay | Admitting: Primary Care

## 2021-12-26 ENCOUNTER — Encounter (HOSPITAL_BASED_OUTPATIENT_CLINIC_OR_DEPARTMENT_OTHER): Payer: Self-pay | Admitting: Cardiology

## 2021-12-26 DIAGNOSIS — R928 Other abnormal and inconclusive findings on diagnostic imaging of breast: Secondary | ICD-10-CM

## 2021-12-29 ENCOUNTER — Encounter (INDEPENDENT_AMBULATORY_CARE_PROVIDER_SITE_OTHER): Payer: Self-pay | Admitting: Primary Care

## 2021-12-29 ENCOUNTER — Ambulatory Visit (INDEPENDENT_AMBULATORY_CARE_PROVIDER_SITE_OTHER): Payer: 59 | Admitting: Primary Care

## 2021-12-29 ENCOUNTER — Other Ambulatory Visit (INDEPENDENT_AMBULATORY_CARE_PROVIDER_SITE_OTHER): Payer: Self-pay | Admitting: Primary Care

## 2021-12-29 VITALS — BP 140/83 | HR 77 | Temp 97.7°F | Ht 67.0 in | Wt 298.6 lb

## 2021-12-29 DIAGNOSIS — D6869 Other thrombophilia: Secondary | ICD-10-CM

## 2021-12-29 DIAGNOSIS — G8929 Other chronic pain: Secondary | ICD-10-CM

## 2021-12-29 DIAGNOSIS — Z760 Encounter for issue of repeat prescription: Secondary | ICD-10-CM

## 2021-12-29 DIAGNOSIS — J9601 Acute respiratory failure with hypoxia: Secondary | ICD-10-CM | POA: Diagnosis not present

## 2021-12-29 DIAGNOSIS — I82721 Chronic embolism and thrombosis of deep veins of right upper extremity: Secondary | ICD-10-CM

## 2021-12-29 DIAGNOSIS — J0110 Acute frontal sinusitis, unspecified: Secondary | ICD-10-CM

## 2021-12-29 DIAGNOSIS — M25562 Pain in left knee: Secondary | ICD-10-CM

## 2021-12-29 MED ORDER — LORATADINE 10 MG PO TABS: 10.0000 mg | ORAL_TABLET | Freq: Every day | ORAL | 11 refills | Status: DC

## 2021-12-29 MED ORDER — FLUTICASONE PROPIONATE 50 MCG/ACT NA SUSP: 2.0000 | Freq: Every day | NASAL | 6 refills | Status: DC

## 2021-12-29 MED ORDER — AMOXICILLIN-POT CLAVULANATE 875-125 MG PO TABS: 1.0000 | ORAL_TABLET | Freq: Two times a day (BID) | ORAL | 0 refills | Status: DC

## 2021-12-29 NOTE — Telephone Encounter (Signed)
Sent to PCP ?

## 2021-12-29 NOTE — Progress Notes (Signed)
? ?   [name] ? ? Ms.[name] is a 66 y.o. female who presents for evaluation of sinus pain. Symptoms include: congestion, headaches, itchy eyes, itchy nose, post nasal drip, and sinus pressure. Onset of symptoms was 2 weeks ago. Symptoms have been gradually worsening since that time. Past history is significant for asthma. Patient is a non-smoker. She also complains of chronic left knee pain which can be related to morbid obesity. ? ?The following portions of the patient's history were reviewed and updated as appropriate: allergies, current medications, past family history, past medical history, past social history, and past surgical history. ? ?Review of Systems ?Pertinent items noted in HPI and remainder of comprehensive ROS otherwise negative.  ? ?Objective:  ?BP 140/83 (BP Location: Left Arm, Patient Position: Sitting, Cuff Size: Large)   Pulse 77   Temp 97.7 ?F (36.5 ?C) (Oral)   Ht '5\' 7"'$  (1.702 m)   Wt 298 lb 9.6 oz (135.4 kg)   SpO2 98%   BMI 46.77 kg/m?   ? ?Assessment:  ?[name] was seen today for sinus problem and knee pain. ? ?Diagnoses and all orders for this visit: ? ?Chronic pain of left knee ?Work on losing weight to help reduce joint pain. May alternate with heat and ice application for pain relief. May only use acetaminophen for pain relief. Other alternatives include massage, acupuncture and water aerobics.  You must stay active and avoid a sedentary lifestyle. Not a candidate for knee surgery . ? ?Acute frontal sinusitis, recurrence not specified ?Nasal saline sprays. ?Antihistamines per medication orders. ?Augmentin per medication orders. ?-     amoxicillin-clavulanate (AUGMENTIN) 875-125 MG tablet; Take 1 tablet by mouth 2 (two) times daily. ?-     fluticasone (FLONASE) 50 MCG/ACT nasal spray; Place 2 sprays into both nostrils daily. ?-     loratadine (CLARITIN) 10 MG tablet; Take 1 tablet (10 mg total) by mouth daily. ? ?Medication refill ?-     loratadine  (CLARITIN) 10 MG tablet; Take 1 tablet (10 mg total) by mouth daily. ? ?  ? ?This note has been created with Surveyor, quantity. Any transcriptional errors are unintentional.  ? ?[name], NP ?12/29/2021, 9:49 AM  ?

## 2021-12-31 ENCOUNTER — Other Ambulatory Visit (HOSPITAL_COMMUNITY): Payer: Self-pay | Admitting: Family Medicine

## 2022-01-03 ENCOUNTER — Ambulatory Visit (INDEPENDENT_AMBULATORY_CARE_PROVIDER_SITE_OTHER): Payer: 59 | Admitting: Cardiology

## 2022-01-03 ENCOUNTER — Encounter (HOSPITAL_BASED_OUTPATIENT_CLINIC_OR_DEPARTMENT_OTHER): Payer: Self-pay | Admitting: Cardiology

## 2022-01-03 VITALS — BP 170/80 | HR 89 | Ht 67.0 in | Wt 302.6 lb

## 2022-01-03 DIAGNOSIS — I428 Other cardiomyopathies: Secondary | ICD-10-CM

## 2022-01-03 DIAGNOSIS — I480 Paroxysmal atrial fibrillation: Secondary | ICD-10-CM

## 2022-01-03 DIAGNOSIS — Z79899 Other long term (current) drug therapy: Secondary | ICD-10-CM

## 2022-01-03 DIAGNOSIS — I484 Atypical atrial flutter: Secondary | ICD-10-CM | POA: Diagnosis not present

## 2022-01-03 DIAGNOSIS — I5043 Acute on chronic combined systolic (congestive) and diastolic (congestive) heart failure: Secondary | ICD-10-CM | POA: Diagnosis not present

## 2022-01-03 DIAGNOSIS — Z9889 Other specified postprocedural states: Secondary | ICD-10-CM

## 2022-01-03 MED ORDER — TORSEMIDE 20 MG PO TABS: 40.0000 mg | ORAL_TABLET | Freq: Three times a day (TID) | ORAL | Status: DC

## 2022-01-03 NOTE — Patient Instructions (Signed)
Medication Instructions:  ?INCREASE: Torsemide 40 mg 3 times a day ? ?*If you need a refill on your cardiac medications before your next appointment, please call your pharmacy* ? ? ?Lab Work: ?Your provider has recommended lab work today (BMP). Please have this collected at [facility] at [place]. The lab is open 8:00 am - 4:30 pm. Please avoid 12:00p - 1:00p for lunch hour. You do not need an appointment. Please go to [address] [place], [place] [address]. This is in the Primary Care office on the 3rd floor, let them know you are there for blood work and they will direct you to the lab. ? ?If you have labs (blood work) drawn today and your tests are completely normal, you will receive your results only by: ?MyChart Message (if you have MyChart) OR ?A paper copy in the mail ?If you have any lab test that is abnormal or we need to change your treatment, we will call you to review the results. ? ? ?Testing/Procedures: ?None ordered today ? ? ?Follow-Up: ?At [facility], you and your health needs are our priority.  As part of our continuing mission to provide you with exceptional heart care, we have created designated Provider Care Teams.  These Care Teams include your primary Cardiologist (physician) and Advanced Practice Providers (APPs -  Physician Assistants and Nurse Practitioners) who all work together to provide you with the care you need, when you need it. ? ?We recommend signing up for the patient portal called "MyChart".  Sign up information is provided on this After Visit Summary.  MyChart is used to connect with patients for Virtual Visits (Telemedicine).  Patients are able to view lab/test results, encounter notes, upcoming appointments, etc.  Non-urgent messages can be sent to your provider as well.   ?To learn more about what you can do with MyChart, go to [email or web address].   ? ?Your next appointment:   ?1 week(s) ? ?The format for your next appointment:   ?In  Person ? ?Provider:   ?Buford Dresser, MD{ ? ? ?Important Information About Sugar ? ? ? ? ? ? ?

## 2022-01-03 NOTE — Progress Notes (Signed)
?Cardiology Office Note:   ? ?Date:  01/03/2022  ? ?ID:  [name], DOB 11/09/1955, MRN [id number] ? ?PCP:  Grayce Sessions, NP  ?Cardiologist:  Jodelle Red, MD ? ?Referring MD: Grayce Sessions, NP  ? ?CC: follow up ? ?History of Present Illness:   ? ?[name] is a 66 y.o. female with a hx of atrial fibrillation, chronic systolic and diastolic heart failure, type II diabetes who is seen for hospital follow up today. I initially saw her 03/31/20 as a new consult at the request of Grayce Sessions, NP for the evaluation and management of atrial fibrillation, heart failure. ? ?Today: ?Weight at home 298-299 lbs, 302 lbs in the office. Was 289 lbs at prior visit 15 days ago. Not sleeping well, coughing at night. Breathing is worse. Feels that she is still out of rhythm. Blood pressure higher than normal today. ? ?On my review of her ECG, appears that she is in sinus rhythm with 1st degree AV block today. Rate is 89 bpm ? ?No chest pain. No syncope.  ? ?She is scheduled to see Dr. Lalla Brothers on 5/30. ? ?Past Medical History:  ?Diagnosis Date  ? Acute on chronic systolic CHF (congestive heart failure) (HCC) 12/21/2020  ? Allergic rhinitis   ? Arthritis   ? Asthma   ? Chest pain 12/27/2017  ? Chronic diastolic CHF (congestive heart failure) (HCC)   ? COPD (chronic obstructive pulmonary disease) (HCC)   ? Depression   ? DM (diabetes mellitus) (HCC)   ? DVT (deep vein thrombosis) in pregnancy   ? Dysrhythmia   ? atrial fibrilation  ? GERD (gastroesophageal reflux disease)   ? Headache(784.0)   ? HTN (hypertension)   ? Hyperlipidemia   ? Obesity   ? Pneumonia 04/2018  ? RIGHT LOBE  ? Shortness of breath   ? Sleep apnea   ? compliant with CPAP  ? ? ?Past Surgical History:  ?Procedure Laterality Date  ? ABDOMINAL HYSTERECTOMY    ? BUBBLE STUDY  11/26/2020  ? Procedure: BUBBLE STUDY;  Surgeon: Jodelle Red, MD;  Location: [facility] ENDOSCOPY;  Service: Cardiovascular;;  ? CARDIOVERSION N/A 05/06/2020  ?  Procedure: CARDIOVERSION;  Surgeon: Jodelle Red, MD;  Location: [facility] ENDOSCOPY;  Service: Cardiovascular;  Laterality: N/A;  ? CARDIOVERSION N/A 11/04/2020  ? Procedure: CARDIOVERSION;  Surgeon: Lewayne Bunting, MD;  Location: [facility] ENDOSCOPY;  Service: Cardiovascular;  Laterality: N/A;  ? CARDIOVERSION N/A 02/01/2021  ? Procedure: CARDIOVERSION;  Surgeon: Dolores Patty, MD;  Location: [facility] ENDOSCOPY;  Service: Cardiovascular;  Laterality: N/A;  ? CARDIOVERSION N/A 02/09/2021  ? Procedure: CARDIOVERSION;  Surgeon: Dolores Patty, MD;  Location: [facility] ENDOSCOPY;  Service: Cardiovascular;  Laterality: N/A;  ? CARDIOVERSION N/A 04/19/2021  ? Procedure: CARDIOVERSION;  Surgeon: Pricilla Riffle, MD;  Location: [facility] ENDOSCOPY;  Service: Cardiovascular;  Laterality: N/A;  ? CARDIOVERSION N/A 11/25/2021  ? Procedure: CARDIOVERSION;  Surgeon: Dolores Patty, MD;  Location: [facility] ENDOSCOPY;  Service: Cardiovascular;  Laterality: N/A;  ? CATARACT EXTRACTION, BILATERAL    ? CHOLECYSTECTOMY    ? COLONOSCOPY WITH PROPOFOL N/A 03/05/2015  ? Procedure: COLONOSCOPY WITH PROPOFOL;  Surgeon: Jeani Hawking, MD;  Location: [facility] ENDOSCOPY;  Service: Endoscopy;  Laterality: N/A;  ? DIAGNOSTIC LAPAROSCOPY    ? ingrown hallux Left   ? KNEE SURGERY    ? LEFT HEART CATH AND CORONARY ANGIOGRAPHY N/A 12/31/2017  ? Procedure: LEFT HEART CATH AND CORONARY ANGIOGRAPHY;  Surgeon: Rinaldo Cloud, MD;  Location: [place] CV LAB;  Service: Cardiovascular;  Laterality: N/A;  ? MAZE N/A 12/29/2020  ? Procedure: MAZE;  Surgeon: [name], MD;  Location: [place];  Service: Open Heart Surgery;  Laterality: N/A;  ? MITRAL VALVE REPAIR N/A 12/29/2020  ? Procedure: MITRAL VALVE REPAIR USING CARBOMEDICS ANNULOFLEX [place] SIZE 30;  Surgeon: [name], MD;  Location: [place];  Service: Open Heart Surgery;  Laterality: N/A;  ? RIGHT HEART CATH N/A 12/22/2020  ? Procedure: RIGHT HEART CATH;  Surgeon: Larey Dresser, MD;  Location: [place]  CV LAB;  Service: Cardiovascular;  Laterality: N/A;  ? RIGHT HEART CATH N/A 01/31/2021  ? Procedure: RIGHT HEART CATH;  Surgeon: Jolaine Artist, MD;  Location: [place] CV LAB;  Service: Cardiovascular;  Laterality: N/A;  ? RIGHT HEART CATH N/A 07/29/2021  ? Procedure: RIGHT HEART CATH;  Surgeon: Jolaine Artist, MD;  Location: [place] CV LAB;  Service: Cardiovascular;  Laterality: N/A;  ? RIGHT HEART CATH N/A 11/24/2021  ? Procedure: RIGHT HEART CATH;  Surgeon: Jolaine Artist, MD;  Location: [place] CV LAB;  Service: Cardiovascular;  Laterality: N/A;  ? TEE WITHOUT CARDIOVERSION N/A 11/26/2020  ? Procedure: TRANSESOPHAGEAL ECHOCARDIOGRAM (TEE);  Surgeon: Buford Dresser, MD;  Location: [place];  Service: Cardiovascular;  Laterality: N/A;  ? TEE WITHOUT CARDIOVERSION N/A 12/29/2020  ? Procedure: TRANSESOPHAGEAL ECHOCARDIOGRAM (TEE);  Surgeon: [name], MD;  Location: [place];  Service: Open Heart Surgery;  Laterality: N/A;  ? TRICUSPID VALVE REPLACEMENT N/A 12/29/2020  ? 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 ? ?Current Medications: ?Current Outpatient Medications on File Prior to Visit  ?Medication Sig  ? acetaminophen (TYLENOL) 325 MG tablet Take 2 tablets (650 mg total) by mouth every 6 (six) hours as needed for mild pain or moderate pain.  ? albuterol (VENTOLIN HFA) 108 (90 Base) MCG/ACT inhaler Inhale 1-2 puffs into the lungs every 6 (six) hours as needed for wheezing or shortness of breath.  ? Alcohol Swabs (B-D SINGLE USE SWABS REGULAR) PADS 1 each by Other route daily.  ? Alcohol Swabs PADS 1 application by Does not apply route in the morning, at noon, and at bedtime.  ? amiodarone (PACERONE) 200 MG tablet Take 1 tablet (200 mg total) by mouth daily.  ? amoxicillin-clavulanate (AUGMENTIN) 875-125 MG tablet Take 1 tablet by mouth  2 (two) times daily.  ? apixaban (ELIQUIS) 5 MG TABS tablet Take 1 tablet (5 mg total) by mouth 2 (two) times daily.  ? blood glucose meter kit and supplies KIT Dispense based on patient and insurance preference. Use up to four times daily as directed.  ? blood glucose meter kit and supplies Dispense based on patient and insurance preference. Use up to four times daily as directed. (FOR ICD-10 E10.9, E11.9).  ? Blood Glucose Monitoring Suppl (ACCU-CHEK GUIDE ME) w/Device KIT Use as instructed to check blood sugar three times daily. E11.69  ? busPIRone (BUSPAR) 5 MG tablet Take 1 tablet (5 mg total) by mouth 2 (two) times daily.  ? colchicine 0.6 MG tablet Take 0.5 tablets (0.3 mg total) by mouth daily.  ? docusate sodium (COLACE) 100 MG capsule Take 100 mg by mouth 2 (two) times daily.  ? Dulaglutide (TRULICITY) 1.5 QH/4.7ML SOPN Inject 1.5 mg into the skin once a week. (Patient taking differently:  Inject 1.5 mg into the skin every Wednesday.)  ? DULoxetine (CYMBALTA) 60 MG capsule Take 1 capsule (60 mg total) by mouth daily.  ? EPINEPHrine 0.3 mg/0.3 mL IJ SOAJ injection Inject 0.3 mg into the muscle as needed for anaphylaxis.  ? fluticasone (FLONASE) 50 MCG/ACT nasal spray Place 2 sprays into both nostrils daily.  ? glucose blood (ACCU-CHEK GUIDE) test strip Use to check blood sugar TID. E11.69  ? insulin aspart (NOVOLOG) 100 UNIT/ML injection Inject 2-10 Units into the skin 3 (three) times daily before meals. Per sliding scale  ? lidocaine (XYLOCAINE) 5 % ointment Apply 1 application. topically as needed for moderate pain. First choice is lidocaine patch, but if not covered/expensive, please fill ointment (Patient taking differently: Apply 1 application. topically 2 (two) times daily as needed for moderate pain.)  ? loratadine (CLARITIN) 10 MG tablet Take 1 tablet (10 mg total) by mouth daily.  ? metolazone (ZAROXOLYN) 2.5 MG tablet Patient takes 1 tablet by mouth on Monday Wednesday and Friday.  ? metoprolol  succinate (TOPROL-XL) 25 MG 24 hr tablet Take 1 tablet (25 mg total) by mouth in the morning and at bedtime.  ? METOPROLOL TARTRATE PO Take 25 mg by mouth 2 (two) times daily.  ? Multiple Vitamins-Minerals

## 2022-01-04 ENCOUNTER — Other Ambulatory Visit: Payer: 59

## 2022-01-04 LAB — BASIC METABOLIC PANEL
BUN/Creatinine Ratio: 11 — ABNORMAL LOW (ref 12–28)
BUN: 25 mg/dL (ref 8–27)
CO2: 24 mmol/L (ref 20–29)
Calcium: 9.7 mg/dL (ref 8.7–10.3)
Chloride: 104 mmol/L (ref 96–106)
Creatinine, Ser: 2.19 mg/dL — ABNORMAL HIGH (ref 0.57–1.00)
Glucose: 93 mg/dL (ref 70–99)
Potassium: 3.8 mmol/L (ref 3.5–5.2)
Sodium: 143 mmol/L (ref 134–144)
eGFR: 24 mL/min/{1.73_m2} — ABNORMAL LOW (ref >59)

## 2022-01-07 ENCOUNTER — Other Ambulatory Visit (INDEPENDENT_AMBULATORY_CARE_PROVIDER_SITE_OTHER): Payer: Self-pay | Admitting: Primary Care

## 2022-01-07 DIAGNOSIS — K3184 Gastroparesis: Secondary | ICD-10-CM

## 2022-01-09 NOTE — Telephone Encounter (Signed)
Sent to PCP ?

## 2022-01-11 ENCOUNTER — Ambulatory Visit (INDEPENDENT_AMBULATORY_CARE_PROVIDER_SITE_OTHER): Payer: 59 | Admitting: Cardiology

## 2022-01-11 ENCOUNTER — Encounter (HOSPITAL_BASED_OUTPATIENT_CLINIC_OR_DEPARTMENT_OTHER): Payer: Self-pay | Admitting: Cardiology

## 2022-01-11 VITALS — BP 155/91 | HR 90 | Ht 67.0 in | Wt 307.4 lb

## 2022-01-11 DIAGNOSIS — I5043 Acute on chronic combined systolic (congestive) and diastolic (congestive) heart failure: Secondary | ICD-10-CM

## 2022-01-11 DIAGNOSIS — Z79899 Other long term (current) drug therapy: Secondary | ICD-10-CM | POA: Diagnosis not present

## 2022-01-11 DIAGNOSIS — I428 Other cardiomyopathies: Secondary | ICD-10-CM

## 2022-01-11 DIAGNOSIS — I484 Atypical atrial flutter: Secondary | ICD-10-CM

## 2022-01-11 DIAGNOSIS — Z9889 Other specified postprocedural states: Secondary | ICD-10-CM

## 2022-01-11 DIAGNOSIS — I480 Paroxysmal atrial fibrillation: Secondary | ICD-10-CM | POA: Diagnosis not present

## 2022-01-11 NOTE — Patient Instructions (Signed)
Medication Instructions:  Your Physician recommend you continue on your current medication as directed.    *If you need a refill on your cardiac medications before your next appointment, please call your pharmacy*   Lab Work: Your provider has recommended lab work today (BNP, CMP). Please have this collected at [facility] at [place]. The lab is open 8:00 am - 4:30 pm. Please avoid 12:00p - 1:00p for lunch hour. You do not need an appointment. Please go to [address] [place],  [phone]. This is in the Primary Care office on the 3rd floor, let them know you are there for blood work and they will direct you to the lab.  If you have labs (blood work) drawn today and your tests are completely normal, you will receive your results only by: [place] (if you have MyChart) OR A paper copy in the mail If you have any lab test that is abnormal or we need to change your treatment, we will call you to review the results.   Testing/Procedures: None ordered today   Follow-Up: At [facility], you and your health needs are our priority.  As part of our continuing mission to provide you with exceptional heart care, we have created designated Provider Care Teams.  These Care Teams include your primary Cardiologist (physician) and Advanced Practice Providers (APPs -  Physician Assistants and Nurse Practitioners) who all work together to provide you with the care you need, when you need it.  We recommend signing up for the patient portal called "MyChart".  Sign up information is provided on this After Visit Summary.  MyChart is used to connect with patients for Virtual Visits (Telemedicine).  Patients are able to view lab/test results, encounter notes, upcoming appointments, etc.  Non-urgent messages can be sent to your provider as well.   To learn more about what you can do with MyChart, go to [email or web address].    Your next appointment:   3 -4  week(s)  The format for your next appointment:   In Person  Provider:   Buford Dresser, MD{

## 2022-01-11 NOTE — Progress Notes (Signed)
Cardiology Office Note:    Date:  01/11/2022   ID:  LONNETTE Gallagher, DOB 1956-06-21, MRN [id number]  PCP:  Leslie Perna, NP  Cardiologist:  Leslie Dresser, MD  Referring MD: Leslie Perna, NP   CC: follow up  History of Present Illness:    [name] is a 66 y.o. female with a hx of atrial fibrillation, chronic systolic and diastolic heart failure, type II diabetes who is seen for hospital follow up today. I initially saw her 03/31/20 as a new consult at the request of Leslie Perna, NP for the evaluation and management of atrial fibrillation, heart failure.  Today: Here for close follow up. Weight today is actually up 5 lbs since last week, 307 lbs on our scale (305 on her home scale). Overall up about 17 lbs from recent baseline. This is despite TID torsemide with metolazone. Cr had improved at prior visit to 2.19 (baseline had recently been around 3), will recheck today.  She made excellent urine on TID torsemide. She was constantly using the bathroom every 10-15 minutes. Breathing overall feels "rough" today, not sleeping well. No change to eating/drinking. Had minimal appetite. Drinking 2-3 bottles (~20 oz) water per day. Swelling in her legs is getting worse.   Scheduled to see Dr. Quentin Gallagher 5/30. Has follow up in the HF clinic on 01/26/22.  Denies chest pain, syncope or palpitations.   Past Medical History:  Diagnosis Date   Acute on chronic systolic CHF (congestive heart failure) (HCC) 12/21/2020   Allergic rhinitis    Arthritis    Asthma    Chest pain 12/27/2017   Chronic diastolic CHF (congestive heart failure) (HCC)    COPD (chronic obstructive pulmonary disease) (HCC)    Depression    DM (diabetes mellitus) ([place])    DVT (deep vein thrombosis) in pregnancy    Dysrhythmia    atrial fibrilation   GERD (gastroesophageal reflux disease)    Headache(784.0)    HTN (hypertension)    Hyperlipidemia    Obesity    Pneumonia 04/2018   RIGHT LOBE    Shortness of breath    Sleep apnea    compliant with CPAP    Past Surgical History:  Procedure Laterality Date   ABDOMINAL HYSTERECTOMY     BUBBLE STUDY  11/26/2020   Procedure: BUBBLE STUDY;  Surgeon: Leslie Dresser, MD;  Location: [place];  Service: Cardiovascular;;   CARDIOVERSION N/A 05/06/2020   Procedure: CARDIOVERSION;  Surgeon: Leslie Dresser, MD;  Location: [facility] ENDOSCOPY;  Service: Cardiovascular;  Laterality: N/A;   CARDIOVERSION N/A 11/04/2020   Procedure: CARDIOVERSION;  Surgeon: Lelon Perla, MD;  Location: [place];  Service: Cardiovascular;  Laterality: N/A;   CARDIOVERSION N/A 02/01/2021   Procedure: CARDIOVERSION;  Surgeon: Jolaine Artist, MD;  Location: [place];  Service: Cardiovascular;  Laterality: N/A;   CARDIOVERSION N/A 02/09/2021   Procedure: CARDIOVERSION;  Surgeon: Jolaine Artist, MD;  Location: [place];  Service: Cardiovascular;  Laterality: N/A;   CARDIOVERSION N/A 04/19/2021   Procedure: CARDIOVERSION;  Surgeon: Fay Records, MD;  Location: [place];  Service: Cardiovascular;  Laterality: N/A;   CARDIOVERSION N/A 11/25/2021   Procedure: CARDIOVERSION;  Surgeon: Jolaine Artist, MD;  Location: [place];  Service: Cardiovascular;  Laterality: N/A;   CATARACT EXTRACTION, BILATERAL     CHOLECYSTECTOMY     COLONOSCOPY WITH PROPOFOL N/A 03/05/2015   Procedure: COLONOSCOPY WITH PROPOFOL;  Surgeon: Carol Ada, MD;  Location: [facility] ENDOSCOPY;  Service: Endoscopy;  Laterality: N/A;   DIAGNOSTIC LAPAROSCOPY     ingrown hallux Left    KNEE SURGERY     LEFT HEART CATH AND CORONARY ANGIOGRAPHY N/A 12/31/2017   Procedure: LEFT HEART CATH AND CORONARY ANGIOGRAPHY;  Surgeon: Charolette Forward, MD;  Location: [place] CV LAB;  Service: Cardiovascular;  Laterality: N/A;   MAZE N/A 12/29/2020   Procedure: MAZE;  Surgeon: [name], MD;  Location: [place];  Service: Open Heart Surgery;  Laterality: N/A;   MITRAL VALVE  REPAIR N/A 12/29/2020   Procedure: MITRAL VALVE REPAIR USING CARBOMEDICS ANNULOFLEX RING SIZE 30;  Surgeon: [name], MD;  Location: [place];  Service: Open Heart Surgery;  Laterality: N/A;   RIGHT HEART CATH N/A 12/22/2020   Procedure: RIGHT HEART CATH;  Surgeon: Larey Dresser, MD;  Location: [place] CV LAB;  Service: Cardiovascular;  Laterality: N/A;   RIGHT HEART CATH N/A 01/31/2021   Procedure: RIGHT HEART CATH;  Surgeon: Jolaine Artist, MD;  Location: [place] CV LAB;  Service: Cardiovascular;  Laterality: N/A;   RIGHT HEART CATH N/A 07/29/2021   Procedure: RIGHT HEART CATH;  Surgeon: Jolaine Artist, MD;  Location: [place] CV LAB;  Service: Cardiovascular;  Laterality: N/A;   RIGHT HEART CATH N/A 11/24/2021   Procedure: RIGHT HEART CATH;  Surgeon: Jolaine Artist, MD;  Location: [place] CV LAB;  Service: Cardiovascular;  Laterality: N/A;   TEE WITHOUT CARDIOVERSION N/A 11/26/2020   Procedure: TRANSESOPHAGEAL ECHOCARDIOGRAM (TEE);  Surgeon: Leslie Dresser, MD;  Location: [facility] ENDOSCOPY;  Service: Cardiovascular;  Laterality: N/A;   TEE WITHOUT CARDIOVERSION N/A 12/29/2020   Procedure: TRANSESOPHAGEAL ECHOCARDIOGRAM (TEE);  Surgeon: [name], MD;  Location: [place];  Service: Open Heart Surgery;  Laterality: N/A;   TRICUSPID VALVE REPLACEMENT N/A 12/29/2020   Procedure: 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 RING SIZE 34;  Surgeon: [name], MD;  Location: [place];  Service: Open Heart Surgery;  Laterality: N/A;    Current Medications: Current Outpatient Medications on File Prior to Visit  Medication Sig   acetaminophen (TYLENOL) 325 MG tablet Take 2 tablets (650 mg total) by mouth every 6 (six) hours as needed for mild pain or moderate pain.   albuterol (VENTOLIN HFA) 108 (90 Base) MCG/ACT inhaler Inhale 1-2 puffs into the lungs every 6 (six) hours as needed for wheezing or shortness of breath.   Alcohol Swabs (B-D  SINGLE USE SWABS REGULAR) PADS 1 each by Other route daily.   Alcohol Swabs PADS 1 application by Does not apply route in the morning, at noon, and at bedtime.   amiodarone (PACERONE) 200 MG tablet Take 1 tablet (200 mg total) by mouth daily.   amoxicillin-clavulanate (AUGMENTIN) 875-125 MG tablet Take 1 tablet by mouth 2 (two) times daily.   apixaban (ELIQUIS) 5 MG TABS tablet Take 1 tablet (5 mg total) by mouth 2 (two) times daily.   blood glucose meter kit and supplies KIT Dispense based on patient and insurance preference. Use up to four times daily as directed.   blood glucose meter kit and supplies Dispense based on patient and insurance preference. Use up to four times daily as directed. (FOR ICD-10 E10.9, E11.9).   Blood Glucose Monitoring Suppl (ACCU-CHEK GUIDE ME) w/Device KIT Use as instructed to check blood sugar three times daily. E11.69   busPIRone (BUSPAR) 5 MG tablet Take 1 tablet (5 mg total) by mouth 2 (two) times daily.   colchicine 0.6 MG tablet  Take 0.5 tablets (0.3 mg total) by mouth daily.   docusate sodium (COLACE) 100 MG capsule Take 100 mg by mouth 2 (two) times daily.   Dulaglutide (TRULICITY) 1.5 IE/3.3IR SOPN Inject 1.5 mg into the skin once a week. (Patient taking differently: Inject 1.5 mg into the skin every Wednesday.)   DULoxetine (CYMBALTA) 60 MG capsule Take 1 capsule (60 mg total) by mouth daily.   EPINEPHrine 0.3 mg/0.3 mL IJ SOAJ injection Inject 0.3 mg into the muscle as needed for anaphylaxis.   fluticasone (FLONASE) 50 MCG/ACT nasal spray Place 2 sprays into both nostrils daily.   glucose blood (ACCU-CHEK GUIDE) test strip Use to check blood sugar TID. E11.69   insulin aspart (NOVOLOG) 100 UNIT/ML injection Inject 2-10 Units into the skin 3 (three) times daily before meals. Per sliding scale   lidocaine (XYLOCAINE) 5 % ointment Apply 1 application. topically as needed for moderate pain. First choice is lidocaine patch, but if not covered/expensive, please  fill ointment (Patient taking differently: Apply 1 application. topically 2 (two) times daily as needed for moderate pain.)   loratadine (CLARITIN) 10 MG tablet Take 1 tablet (10 mg total) by mouth daily.   metolazone (ZAROXOLYN) 2.5 MG tablet Patient takes 1 tablet by mouth on Monday Wednesday and Friday.   metoprolol succinate (TOPROL-XL) 25 MG 24 hr tablet Take 1 tablet (25 mg total) by mouth in the morning and at bedtime.   Multiple Vitamins-Minerals (MULTIVITAMIN WOMEN PO) Take 1 tablet by mouth daily.   nitroGLYCERIN (NITROSTAT) 0.4 MG SL tablet Place 1 tablet (0.4 mg total) under the tongue every 5 (five) minutes x 3 doses as needed for chest pain.   ondansetron (ZOFRAN-ODT) 4 MG disintegrating tablet Take 1 tablet (4 mg total) by mouth every 8 (eight) hours as needed for nausea or vomiting.   pantoprazole (PROTONIX) 40 MG tablet Take 1 tablet (40 mg total) by mouth daily.   potassium chloride SA (KLOR-CON M) 20 MEQ tablet Take 2 tablets (40 mEq total) by mouth daily. Take an additional 20 meq with every dose of METOLAZONE   RESTASIS 0.05 % ophthalmic emulsion Place 1 drop into both eyes 2 (two) times daily.   rosuvastatin (CRESTOR) 10 MG tablet Take 1 tablet (10 mg total) by mouth every evening.   torsemide (DEMADEX) 20 MG tablet Take 2 tablets (40 mg total) by mouth 3 (three) times daily.   No current facility-administered medications on file prior to visit.     Allergies:   Bee pollen and Sulfa antibiotics   Social History   Tobacco Use   Smoking status: Never   Smokeless tobacco: Never  Vaping Use   Vaping Use: Never used  Substance Use Topics   Alcohol use: No   Drug use: No    Family History: family history includes Breast cancer in her mother; CAD in an other family member; Cancer in her father and mother; Hypertension in her brother, mother, and sister.  ROS:   Please see the history of present illness.   Additional pertinent ROS otherwise  unremarkable.  EKGs/Labs/Other Studies Reviewed:    The following studies were reviewed today: [place] 22-Dec-2021 Findings:   RA = 29 RV = 65/30 PA = 67/37 (45) PCW = 30 (v waves to 45) Fick cardiac output/index = 6.2/2.5 PVR = 2.4 WU Ao sat = 93% PA sat = 50%, 55% PAPi = 1.0    Assessment: 1. Severely elevated biventricular pressures 2. Moderate pulmonary venous HTN with marked v-waves in PCWP  tracing 3. Severe RV failure   Plan/Discussion:   Add milrinone. Continue diuresis.  Echo 11/22/21:  Left ventricular ejection fraction, by estimation, is 35 to 40%. The left ventricle has moderately decreased function. The left ventricle demonstrates global hypokinesis. Left ventricular diastolic function could not be evaluated. There is the interventricular septum is flattened in systole and diastole, consistent with right ventricular pressure and volume overload.  Right ventricular systolic function is moderately reduced. The right ventricular size is moderately enlarged. There is moderately elevated pulmonary artery systolic pressure. The estimated right ventricular systolic pressure is 93.2 mmHg.   Left atrial size was severely dilated.  Right atrial size was severely dilated. The mitral valve is myxomatous. Trivial mitral valve regurgitation. Mild mitral stenosis. The mean mitral valve gradient is 8.2 mmHg with average heart rate of 100 bpm. There is a 30 mm CARBOMEDICS ANNULOFLEX RING prosthetic annuloplasty ring present in the mitral position. Procedure Date: 12/29/20.  Tricuspid annuloplasty repair. The tricuspid valve is has been repaired/replaced. Tricuspid valve regurgitation is severe.  The aortic valve is tricuspid. Aortic valve regurgitation is not visualized. No aortic stenosis is present.  The inferior vena cava is dilated in size with >50% respiratory variability, suggesting right atrial pressure of 8 mmHg.  CT Chest 10/17/2021: FINDINGS: Cardiovascular:  Cardiomegaly. No pericardial effusion. Left atrial appendage clip. Mitral and tricuspid valve repair. Thoracic aorta is normal in caliber with mild plaque.   Mediastinum/Nodes: No enlarged nodes.  Thyroid is unremarkable.   Lungs/Pleura: Bibasilar atelectasis/scarring. Patchy ground-glass density may reflect atelectasis or edema. No pleural effusion or pneumothorax.   Upper Abdomen: No acute abnormality.   Musculoskeletal: No acute osseous abnormality.   IMPRESSION: Bibasilar atelectasis/scarring. Patchy ground-glass density may reflect atelectasis or edema. Global cardiomegaly.  Right Heart Cath 07/29/2021: Findings:   RA = 5 RV = 36/8 PA = 40/11 (26) PCW = 13 Fick cardiac output/index = 4.3/1.9 PVR = 3.0 WU FA sat = 95% PA sat = 52%, 55%   Assessment: Normal filling pressures with mild PAH Moderately reduced cardiac out likely due in part to low fluid status   Plan/Discussion:    Volume status normal to low. Dyspnea likely not related to HF. Would hold diuretics for now and resume when Scr improving. We will follow as outpatient.     Echo 07/29/2021:  1. Left ventricular ejection fraction, by estimation, is 40 to 45%. The  left ventricle has mildly decreased function. The left ventricle  demonstrates global hypokinesis. There is mild left ventricular  hypertrophy. Left ventricular diastolic parameters  are indeterminate.   2. Right ventricular systolic function is mildly reduced. The right  ventricular size is normal. There is normal pulmonary artery systolic  pressure.   3. Left atrial size was moderately dilated.   4. Right atrial size was mildly dilated.   5. HR 90 bpm. MV peak gradient, 14.1 mmHg. The mean mitral valve gradient  is 6.5 mmHg      . There is a 30 mm prosthetic annuloplasty ring present in the mitral  position.   6. S/p repair with an annuloplasty ring.   7. The aortic valve is normal in structure. Aortic valve regurgitation is  not  visualized. No aortic stenosis is present.   8. The inferior vena cava is normal in size with greater than 50%  respiratory variability, suggesting right atrial pressure of 3 mmHg.   Conclusion(s)/Recommendation(s): Compared to prior echo 02/28/2021, PA pressure reduced.   CT Chest 06/21/2021: FINDINGS: Cardiovascular: Cardiomegaly. Prior median  sternotomy and valve replacement. Aortic calcifications. No aneurysm.   Mediastinum/Nodes: No mediastinal, hilar, or axillary adenopathy. Trachea and esophagus are unremarkable. Thyroid unremarkable.   Lungs/Pleura: Linear scarring at the left base. No confluent airspace opacities or effusions. No pneumothorax.   Upper Abdomen: Imaging into the upper abdomen demonstrates no acute findings.   Musculoskeletal: Chest wall soft tissues are unremarkable. No acute bony abnormality.   IMPRESSION: No acute cardiopulmonary disease.   Cardiomegaly.   Aortic Atherosclerosis (ICD10-I70.0).  Echo 02/28/2021: 1. Left ventricular ejection fraction, by estimation, is 40 to 45%. The  left ventricle has mildly decreased function. The left ventricle  demonstrates global hypokinesis. There is mild concentric left ventricular  hypertrophy.   2. Right ventricular systolic function is mildly reduced. The right  ventricular size is mildly-to-moderately. There is normal pulmonary artery  systolic pressure. The estimated right ventricular systolic pressure is  87.5 mmHg.   3. Left atrial size was severely dilated.   4. Right atrial size was moderately dilated.   5. The mitral valve has been repaired/replaced. There is a 30 mm  Annuloflex prosthetic annuloplasty ring present in the mitral position.  Mild mitral valve regurgitation. Mild mitral stenosis with mean gradient  50mmHg at HR 82bpm.   6. The tricuspid valve is status post repair with an annuloplasty ring.  Tricuspid valve regurgitation is moderate.   7. The aortic valve is tricuspid. There is mild  calcification of the  aortic valve. There is mild thickening of the aortic valve. Mild aortic  valve sclerosis is present, with no evidence of aortic valve stenosis.   8. The inferior vena cava is dilated in size with <50% respiratory  variability, suggesting right atrial pressure of 15 mmHg.   Comparison(s): Compared to prior TTE on 02/25/21, the LVEF has improved to  40-45% (previously 30-35%) with restoration of NSR. The mitral valve  gradient has decresed to 1mmHg from 32mmHg. The PASP has improved.   Echo 02/25/2021:  1. Left ventricular ejection fraction, by estimation, is 30 to 35%. The  left ventricle has moderately decreased function. The left ventricle  demonstrates global hypokinesis. There is moderate left ventricular  hypertrophy. Left ventricular diastolic  parameters are indeterminate.   2. Right ventricular systolic function is moderately reduced. The right  ventricular size is mildly enlarged. There is moderately elevated  pulmonary artery systolic pressure. The estimated right ventricular  systolic pressure is 64.3 mmHg.   3. Left atrial size was moderately dilated.   4. Right atrial size was mildly dilated.   5. The mitral valve has been repaired/replaced. There is a prosthetic  annuloplasty ring present in the mitral position. Mild mitral valve  regurgitation. Moderate to severe mitral stenosis. Mean gradient 10 mmHg  at HR 129 bpm.   6. The tricuspid valve is has been repaired/replaced. The tricuspid valve  is status post repair with an annuloplasty ring. Mean gradient 5 mmHg at  HR 129 bpm. Tricuspid valve regurgitation is moderate.   7. The aortic valve is tricuspid. Aortic valve regurgitation is not  visualized. No aortic stenosis is present.   8. The inferior vena cava is dilated in size with <50% respiratory  variability, suggesting right atrial pressure of 15 mmHg.   Right Heart Cath 01/31/2021: Findings:   RA = 5 RV = 33/2 PA = 35/13 (22) PCW =  13 Fick cardiac output/index = 4.3/2.0 PVR = 2.1 WU FA sat = 97% PA sat = 54%, 54%   Assessment: 1. Low filling pressures with  moderately reduced CO.    Plan/Discussion: Would hold diuretics for now. Hopefully CO will improve with DC-CV.   Echo 07/09/20 1. Left ventricular ejection fraction, by estimation, is 45 to 50%. The  left ventricle has mildly decreased function. The left ventricle  demonstrates global hypokinesis. Left ventricular diastolic function could  not be evaluated.   2. Right ventricular systolic function was not well visualized. The right  ventricular size is moderately enlarged. There is normal pulmonary artery  systolic pressure.   3. Left atrial size was mildly dilated.   4. Right atrial size was moderately dilated.   5. Eccentric MR jet; appears mild-moderate on parasternal views, but on  apical views (see images 52, 66, 72) extends into distal atrium and may  have Coanda effect. Incompletely visualized and PV not sampled, suspect  moderate to severe MR.. The mitral  valve is rheumatic. Moderate to severe mitral valve regurgitation.   6. Tricuspid valve regurgitation is severe.   7. The aortic valve is tricuspid. Aortic valve regurgitation is not  visualized. No aortic stenosis is present.   8. The inferior vena cava is normal in size with <50% respiratory  variability, suggesting right atrial pressure of 8 mmHg.   Echo 07/02/2018 - Left ventricle: The cavity size was normal. Systolic function was    mildly reduced. The estimated ejection fraction was in the range    of 45% to 50%. There is moderate hypokinesis of the    entireanteroseptal myocardium.  - Aortic valve: There was mild regurgitation.  - Mitral valve: Mildly dilated, calcified annulus. There was severe    regurgitation.  - Left atrium: The atrium was severely dilated.  - Right ventricle: The cavity size was mildly dilated. Wall    thickness was normal. Systolic function was mildly to  moderately    reduced.  - Right atrium: The atrium was severely dilated.  - Tricuspid valve: There was severe regurgitation.  - Pulmonary arteries: Systolic pressure was mildly increased. PA    peak pressure: 31 mm Hg (S).   Cath 12/31/2017 Prox LAD lesion is 10% stenosed. Dist LM to Ost LAD lesion is 10% stenosed. Ost Cx lesion is 15% stenosed. The left ventricular systolic function is normal. LV end diastolic pressure is normal.  EKG:  Personally reviewed 01/03/22: sinus rhythm with 1st degree AV block at 89 bpm 12/19/21: atypical flutter at 88 bpm with intermittent aberrancy 11/08/2021: atypical atrial flutter at 131 bpm 07/01/2021: accelerated junction rhythm with PVC pattern every 3rd beat, rate 84 bpm 4/22- Atrial fibrillation, rate 82 bpm with PVCs 09/08/20- Atrial fibrillation, rate 108 bpm  Recent Labs: 11/22/2021: TSH 2.224 11/29/2021: Magnesium 2.1 12/04/2021: Hemoglobin 8.8; Platelets 232 12/14/2021: ALT 18; B Natriuretic Peptide 184.4 01/03/2022: BUN 25; Creatinine, Ser 2.19; Potassium 3.8; Sodium 143   Recent Lipid Panel    Component Value Date/Time   CHOL 158 05/03/2021 0603   CHOL 140 06/24/2020 1054   TRIG 83 05/03/2021 0603   HDL 50 05/03/2021 0603   HDL 46 06/24/2020 1054   CHOLHDL 3.2 05/03/2021 0603   VLDL 17 05/03/2021 0603   LDLCALC 91 05/03/2021 0603   LDLCALC 75 06/24/2020 1054    Physical Exam:    VS:  BP (!) 155/91 (BP Location: Left Wrist, Patient Position: Sitting)   Pulse 90   Ht 5\' 7"  (1.702 m)   Wt (!) 307 lb 6.4 oz (139.4 kg)   BMI 48.15 kg/m     Wt Readings from Last 3 Encounters:  01/11/22 (!) 307 lb 6.4 oz (139.4 kg)  01/03/22 (!) 302 lb 9.6 oz (137.3 kg)  12/29/21 298 lb 9.6 oz (135.4 kg)    GEN: Well nourished, well developed in no acute distress HEENT: Normal, moist mucous membranes NECK: JVD/TR up to ear at 90 degrees CARDIAC: regular rhythm, normal S1 and S2, no rubs or gallops. 2/6 systolic murmur. VASCULAR: Radial and DP  pulses 2+ bilaterally. No carotid bruits RESPIRATORY:  Clear to auscultation without rales, wheezing or rhonchi  ABDOMEN: Soft, non-tender, non-distended MUSCULOSKELETAL:  Ambulates independently SKIN: Warm and dry, no firm/pitting edema but bilateral leg circumference greater than prior NEUROLOGIC:  Alert and oriented x 3. No focal neuro deficits noted. PSYCHIATRIC:  Normal affect    ASSESSMENT:    1. Acute on chronic combined systolic and diastolic congestive heart failure ([place])   2. Medication management   3. AF (paroxysmal atrial fibrillation) ([place])   4. Atypical atrial flutter ([place])   5. NICM (nonischemic cardiomyopathy) ([place])   6. H/O mitral valve repair   7. History of tricuspid valve repair   8. Morbid obesity (HCC)      PLAN:    atrial fibrillation, paroxysmal Atypical atrial flutter -she is regular and rate controlled, but prior ECGs have noted both atypical flutter and NSR -pending appt with Dr. Quentin Gallagher 5/30 -CHA2DS2/VAS Stroke Risk Points=4, continue DOAC -has tried tikosyn, had MAZE and multiple cardioversions -continue metoprolol, amiodarone.  -I am concerned about loss of atrial kick if ablate/pace strategy needed given how symptomatic she is not in sinus rhythm, but we have limited options if she has recurrent RVR.  S/P MVR and TVR, with post op mitral stenosis Acute on chronic systolic and diastolic heart failure Nonischemic cardiomyopathy Nonobstructive CAD Type II diabetes -following closely with heart failure clinic -NYHA 4 today, worsening weights -last EF 35-40% -she endorsed making increase urine on TID torsemide. Continue current metolazone schedule. 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 On [place]. -on metoprolol succinate -no aspirin when on DOAC -continue statin -I spoke with Dr. [name] today. We both feel there is not much else to offer at this point beyond volume management  strategy. As long as she is making good urine, we can continue to attempt this at home. At some point, her kidneys may no longer tolerate and we may have to discuss dialysis with nephrology. For now, will continue with aggressive diuresis. If she acutely worsens, she will need to be admitted for diuretics.  History of obstructive sleep apnea, with CPAP at home: -has order for repeat sleep study  Strong family history of heart disease/heart failure: she is unclear of the etiology of this  Cardiac risk counseling and prevention recommendations: -recommend heart healthy/Mediterranean diet, with whole grains, fruits, vegetable, fish, lean meats, nuts, and olive oil. Limit salt. -recommend moderate walking, 3-5 times/week for 30-50 minutes each session. Aim for at least 150 minutes.week. Goal should be pace of 3 miles/hours, or walking 1.5 miles in 30 minutes -recommend avoidance of tobacco products. Avoid excess alcohol. -ASCVD risk score: The 10-year ASCVD risk score (Arnett DK, et al., 2019) is: 45.1%   Values used to calculate the score:     Age: 77 years     Sex: Female     Is Non-Hispanic African American: Yes     Diabetic: Yes     Tobacco smoker: Yes     Systolic Blood Pressure: 546 mmHg     Is  BP treated: Yes     HDL Cholesterol: 50 mg/dL     Total Cholesterol: 158 mg/dL    Plan for follow up: 1 week with EP, then 1 week with HF, then 1 week with me  Overall I am very concerned about long term management of her issues. Her heart failure has worsened since our recent visit, despite increasing diuretic and what appears to be restoration of sinus rhythm. If we cannot temporize her as an outpatient she may require readmission. She has had frequent and recurrent hospitalizations since her surgery. Limited options for arrhythmia management as well. Instructed her to contact me if her symptoms worsen. High risk of recurrent hospitalization, high complexity medical decision making.  Leslie Dresser, MD, PhD, [facility]    Medication Adjustments/Labs and Tests Ordered: Current medicines are reviewed at length with the patient today.  Concerns regarding medicines are outlined above.   Orders Placed This Encounter  Procedures   B Nat Peptide   Comp Met (CMET)   No orders of the defined types were placed in this encounter.  Patient Instructions  Medication Instructions:  Your Physician recommend you continue on your current medication as directed.    *If you need a refill on your cardiac medications before your next appointment, please call your pharmacy*   Lab Work: Your provider has recommended lab work today (BNP, CMP). Please have this collected at [facility] [place]. The lab is open 8:00 am - 4:30 pm. Please avoid 12:00p - 1:00p for lunch hour. You do not need an appointment. Please go to [address] [place] [place], [place] [address]. This is in the Primary Care office on the 3rd floor, let them know you are there for blood work and they will direct you to the lab.  If you have labs (blood work) drawn today and your tests are completely normal, you will receive your results only by: [place] (if you have MyChart) OR A paper copy in the mail If you have any lab test that is abnormal or we need to change your treatment, we will call you to review the results.   Testing/Procedures: None ordered today   Follow-Up: At [facility], you and your health needs are our priority.  As part of our continuing mission to provide you with exceptional heart care, we have created designated Provider Care Teams.  These Care Teams include your primary Cardiologist (physician) and Advanced Practice Providers (APPs -  Physician Assistants and Nurse Practitioners) who all work together to provide you with the care you need, when you need it.  We recommend signing up for the patient portal called "MyChart".  Sign up information  is provided on this After Visit Summary.  MyChart is used to connect with patients for Virtual Visits (Telemedicine).  Patients are able to view lab/test results, encounter notes, upcoming appointments, etc.  Non-urgent messages can be sent to your provider as well.   To learn more about what you can do with MyChart, go to [email or web address].    Your next appointment:   3 -4 week(s)  The format for your next appointment:   In Person  Provider:   Buford Dresser, MD{           Signed, Leslie Dresser, MD PhD 01/11/2022    [place]

## 2022-01-12 LAB — COMPREHENSIVE METABOLIC PANEL
ALT: 17 IU/L (ref 0–32)
AST: 21 IU/L (ref 0–40)
Albumin/Globulin Ratio: 1.1 — ABNORMAL LOW (ref 1.2–2.2)
Albumin: 4.1 g/dL (ref 3.8–4.8)
Alkaline Phosphatase: 171 IU/L — ABNORMAL HIGH (ref 44–121)
BUN/Creatinine Ratio: 9 — ABNORMAL LOW (ref 12–28)
BUN: 20 mg/dL (ref 8–27)
Bilirubin Total: 0.6 mg/dL (ref 0.0–1.2)
CO2: 23 mmol/L (ref 20–29)
Calcium: 9.3 mg/dL (ref 8.7–10.3)
Chloride: 103 mmol/L (ref 96–106)
Creatinine, Ser: 2.29 mg/dL — ABNORMAL HIGH (ref 0.57–1.00)
Globulin, Total: 3.8 g/dL (ref 1.5–4.5)
Glucose: 100 mg/dL — ABNORMAL HIGH (ref 70–99)
Potassium: 3.7 mmol/L (ref 3.5–5.2)
Sodium: 142 mmol/L (ref 134–144)
Total Protein: 7.9 g/dL (ref 6.0–8.5)
eGFR: 23 mL/min/{1.73_m2} — ABNORMAL LOW (ref >59)

## 2022-01-12 LAB — BRAIN NATRIURETIC PEPTIDE: BNP: 304.3 pg/mL — ABNORMAL HIGH (ref 0.0–100.0)

## 2022-01-17 ENCOUNTER — Other Ambulatory Visit: Payer: Self-pay

## 2022-01-17 ENCOUNTER — Emergency Department (HOSPITAL_COMMUNITY): Payer: 59

## 2022-01-17 ENCOUNTER — Encounter: Payer: Self-pay | Admitting: Cardiology

## 2022-01-17 ENCOUNTER — Encounter (HOSPITAL_COMMUNITY): Payer: Self-pay

## 2022-01-17 ENCOUNTER — Inpatient Hospital Stay (HOSPITAL_COMMUNITY)
Admission: EM | Admit: 2022-01-17 | Discharge: 2022-01-23 | DRG: 291 | Disposition: A | Discharge status: Home Health | Payer: 59 | Attending: Family Medicine | Admitting: Family Medicine

## 2022-01-17 ENCOUNTER — Ambulatory Visit (INDEPENDENT_AMBULATORY_CARE_PROVIDER_SITE_OTHER): Payer: 59 | Admitting: Cardiology

## 2022-01-17 VITALS — BP 136/82 | HR 83 | Ht 67.0 in | Wt 307.0 lb

## 2022-01-17 DIAGNOSIS — Z9841 Cataract extraction status, right eye: Secondary | ICD-10-CM

## 2022-01-17 DIAGNOSIS — I5023 Acute on chronic systolic (congestive) heart failure: Secondary | ICD-10-CM

## 2022-01-17 DIAGNOSIS — E876 Hypokalemia: Secondary | ICD-10-CM | POA: Diagnosis present

## 2022-01-17 DIAGNOSIS — I50814 Right heart failure due to left heart failure: Secondary | ICD-10-CM | POA: Diagnosis not present

## 2022-01-17 DIAGNOSIS — R0602 Shortness of breath: Principal | ICD-10-CM

## 2022-01-17 DIAGNOSIS — N179 Acute kidney failure, unspecified: Secondary | ICD-10-CM | POA: Diagnosis present

## 2022-01-17 DIAGNOSIS — Z882 Allergy status to sulfonamides status: Secondary | ICD-10-CM

## 2022-01-17 DIAGNOSIS — Z6841 Body Mass Index (BMI) 40.0 and over, adult: Secondary | ICD-10-CM | POA: Diagnosis not present

## 2022-01-17 DIAGNOSIS — I493 Ventricular premature depolarization: Secondary | ICD-10-CM | POA: Diagnosis present

## 2022-01-17 DIAGNOSIS — F419 Anxiety disorder, unspecified: Secondary | ICD-10-CM | POA: Diagnosis present

## 2022-01-17 DIAGNOSIS — D631 Anemia in chronic kidney disease: Secondary | ICD-10-CM | POA: Diagnosis present

## 2022-01-17 DIAGNOSIS — E1122 Type 2 diabetes mellitus with diabetic chronic kidney disease: Secondary | ICD-10-CM | POA: Diagnosis present

## 2022-01-17 DIAGNOSIS — Z20822 Contact with and (suspected) exposure to covid-19: Secondary | ICD-10-CM | POA: Diagnosis present

## 2022-01-17 DIAGNOSIS — I480 Paroxysmal atrial fibrillation: Secondary | ICD-10-CM | POA: Diagnosis present

## 2022-01-17 DIAGNOSIS — Z91030 Bee allergy status: Secondary | ICD-10-CM

## 2022-01-17 DIAGNOSIS — F32A Depression, unspecified: Secondary | ICD-10-CM | POA: Diagnosis present

## 2022-01-17 DIAGNOSIS — I428 Other cardiomyopathies: Secondary | ICD-10-CM | POA: Diagnosis not present

## 2022-01-17 DIAGNOSIS — Z7901 Long term (current) use of anticoagulants: Secondary | ICD-10-CM | POA: Diagnosis not present

## 2022-01-17 DIAGNOSIS — I482 Chronic atrial fibrillation, unspecified: Secondary | ICD-10-CM

## 2022-01-17 DIAGNOSIS — N184 Chronic kidney disease, stage 4 (severe): Secondary | ICD-10-CM | POA: Diagnosis present

## 2022-01-17 DIAGNOSIS — I509 Heart failure, unspecified: Secondary | ICD-10-CM

## 2022-01-17 DIAGNOSIS — I361 Nonrheumatic tricuspid (valve) insufficiency: Secondary | ICD-10-CM | POA: Diagnosis not present

## 2022-01-17 DIAGNOSIS — I10 Essential (primary) hypertension: Secondary | ICD-10-CM

## 2022-01-17 DIAGNOSIS — E785 Hyperlipidemia, unspecified: Secondary | ICD-10-CM | POA: Diagnosis present

## 2022-01-17 DIAGNOSIS — E1165 Type 2 diabetes mellitus with hyperglycemia: Secondary | ICD-10-CM | POA: Diagnosis present

## 2022-01-17 DIAGNOSIS — I5043 Acute on chronic combined systolic (congestive) and diastolic (congestive) heart failure: Secondary | ICD-10-CM | POA: Diagnosis present

## 2022-01-17 DIAGNOSIS — I5042 Chronic combined systolic (congestive) and diastolic (congestive) heart failure: Secondary | ICD-10-CM | POA: Diagnosis not present

## 2022-01-17 DIAGNOSIS — Z794 Long term (current) use of insulin: Secondary | ICD-10-CM

## 2022-01-17 DIAGNOSIS — I4892 Unspecified atrial flutter: Secondary | ICD-10-CM | POA: Diagnosis present

## 2022-01-17 DIAGNOSIS — Z9049 Acquired absence of other specified parts of digestive tract: Secondary | ICD-10-CM

## 2022-01-17 DIAGNOSIS — I130 Hypertensive heart and chronic kidney disease with heart failure and stage 1 through stage 4 chronic kidney disease, or unspecified chronic kidney disease: Secondary | ICD-10-CM | POA: Diagnosis present

## 2022-01-17 DIAGNOSIS — D6869 Other thrombophilia: Secondary | ICD-10-CM

## 2022-01-17 DIAGNOSIS — I081 Rheumatic disorders of both mitral and tricuspid valves: Secondary | ICD-10-CM | POA: Diagnosis present

## 2022-01-17 DIAGNOSIS — N189 Chronic kidney disease, unspecified: Secondary | ICD-10-CM | POA: Diagnosis present

## 2022-01-17 DIAGNOSIS — E1169 Type 2 diabetes mellitus with other specified complication: Secondary | ICD-10-CM | POA: Diagnosis not present

## 2022-01-17 DIAGNOSIS — I2510 Atherosclerotic heart disease of native coronary artery without angina pectoris: Secondary | ICD-10-CM | POA: Diagnosis present

## 2022-01-17 DIAGNOSIS — I5082 Biventricular heart failure: Secondary | ICD-10-CM | POA: Diagnosis present

## 2022-01-17 DIAGNOSIS — J449 Chronic obstructive pulmonary disease, unspecified: Secondary | ICD-10-CM | POA: Diagnosis present

## 2022-01-17 DIAGNOSIS — Z8249 Family history of ischemic heart disease and other diseases of the circulatory system: Secondary | ICD-10-CM

## 2022-01-17 DIAGNOSIS — I071 Rheumatic tricuspid insufficiency: Secondary | ICD-10-CM

## 2022-01-17 DIAGNOSIS — I110 Hypertensive heart disease with heart failure: Secondary | ICD-10-CM | POA: Diagnosis not present

## 2022-01-17 DIAGNOSIS — I4891 Unspecified atrial fibrillation: Secondary | ICD-10-CM | POA: Diagnosis not present

## 2022-01-17 DIAGNOSIS — G4733 Obstructive sleep apnea (adult) (pediatric): Secondary | ICD-10-CM | POA: Diagnosis present

## 2022-01-17 DIAGNOSIS — Z9842 Cataract extraction status, left eye: Secondary | ICD-10-CM

## 2022-01-17 DIAGNOSIS — I342 Nonrheumatic mitral (valve) stenosis: Secondary | ICD-10-CM | POA: Diagnosis not present

## 2022-01-17 DIAGNOSIS — Z79899 Other long term (current) drug therapy: Secondary | ICD-10-CM

## 2022-01-17 DIAGNOSIS — Z86718 Personal history of other venous thrombosis and embolism: Secondary | ICD-10-CM

## 2022-01-17 LAB — CBC
HCT: 29.2 % — ABNORMAL LOW (ref 36.0–46.0)
Hemoglobin: 8.7 g/dL — ABNORMAL LOW (ref 12.0–15.0)
MCH: 26.6 pg (ref 26.0–34.0)
MCHC: 29.8 g/dL — ABNORMAL LOW (ref 30.0–36.0)
MCV: 89.3 fL (ref 80.0–100.0)
Platelets: 191 10*3/uL (ref 150–400)
RBC: 3.27 MIL/uL — ABNORMAL LOW (ref 3.87–5.11)
RDW: 14.6 % (ref 11.5–15.5)
WBC: 4.6 10*3/uL (ref 4.0–10.5)
nRBC: 0 % (ref 0.0–0.2)

## 2022-01-17 LAB — BRAIN NATRIURETIC PEPTIDE: B Natriuretic Peptide: 292.6 pg/mL — ABNORMAL HIGH (ref 0.0–100.0)

## 2022-01-17 LAB — COMPREHENSIVE METABOLIC PANEL
ALT: 15 U/L (ref 0–44)
AST: 23 U/L (ref 15–41)
Albumin: 3.4 g/dL — ABNORMAL LOW (ref 3.5–5.0)
Alkaline Phosphatase: 132 U/L — ABNORMAL HIGH (ref 38–126)
Anion gap: 8 (ref 5–15)
BUN: 25 mg/dL — ABNORMAL HIGH (ref 8–23)
CO2: 27 mmol/L (ref 22–32)
Calcium: 9.2 mg/dL (ref 8.9–10.3)
Chloride: 106 mmol/L (ref 98–111)
Creatinine, Ser: 2.57 mg/dL — ABNORMAL HIGH (ref 0.44–1.00)
GFR, Estimated: 20 mL/min — ABNORMAL LOW (ref >60)
Glucose, Bld: 116 mg/dL — ABNORMAL HIGH (ref 70–99)
Potassium: 3.3 mmol/L — ABNORMAL LOW (ref 3.5–5.1)
Sodium: 141 mmol/L (ref 135–145)
Total Bilirubin: 0.8 mg/dL (ref 0.3–1.2)
Total Protein: 7.6 g/dL (ref 6.5–8.1)

## 2022-01-17 LAB — TROPONIN I (HIGH SENSITIVITY)
Troponin I (High Sensitivity): 5 ng/L (ref <18)
Troponin I (High Sensitivity): 8 ng/L (ref <18)

## 2022-01-17 LAB — MAGNESIUM: Magnesium: 1.9 mg/dL (ref 1.7–2.4)

## 2022-01-17 LAB — SARS CORONAVIRUS 2 BY RT PCR: SARS Coronavirus 2 by RT PCR: NEGATIVE

## 2022-01-17 MED ORDER — FUROSEMIDE 10 MG/ML IJ SOLN
80.0000 mg | Freq: Once | INTRAMUSCULAR | Status: AC
  Administered 2022-01-17: 80 mg via INTRAVENOUS
  Filled 2022-01-17: qty 8

## 2022-01-17 MED ORDER — POTASSIUM CHLORIDE CRYS ER 20 MEQ PO TBCR
40.0000 meq | EXTENDED_RELEASE_TABLET | Freq: Once | ORAL | Status: AC
Start: 2022-01-17 — End: 2022-01-17
  Administered 2022-01-17: 40 meq via ORAL
  Filled 2022-01-17: qty 2

## 2022-01-17 MED ORDER — FUROSEMIDE 10 MG/ML IJ SOLN
20.0000 mg/h | INTRAVENOUS | Status: DC
  Administered 2022-01-17 – 2022-01-19 (×4): 20 mg/h via INTRAVENOUS
  Filled 2022-01-17 (×6): qty 20

## 2022-01-17 NOTE — Assessment & Plan Note (Addendum)
Stable.  Continue Toprol-XL.  25 mg twice daily.

## 2022-01-17 NOTE — Progress Notes (Signed)
Orthopedic Tech Progress Note Patient Details:  [name] 1956-04-01 [id number]  Ortho Devices Type of Ortho Device: Louretta Parma boot Ortho Device/Splint Location: BLE Ortho Device/Splint Interventions: Ordered, Application, Adjustment   Post Interventions Patient Tolerated: Well Instructions Provided: Care of device  [name] 01/17/2022, 6:50 PM

## 2022-01-17 NOTE — Assessment & Plan Note (Addendum)
Chronic.  Cardiology considering TEE and possible tricuspid clip.

## 2022-01-17 NOTE — Assessment & Plan Note (Signed)
Stable.  Continue Eliquis 5 mg twice daily.

## 2022-01-17 NOTE — ED Triage Notes (Signed)
Pt arrived POV from the drs office stating she has fluid overload and is having SHOB.

## 2022-01-17 NOTE — Assessment & Plan Note (Addendum)
Continue glucose cover and monitoring with insulin sliding scale.  Patient is tolerating po well.  Fasting glucose this am is 137 mg/dl, consistent with hyperglycemia.   Continue with rosuvastatin.

## 2022-01-17 NOTE — Assessment & Plan Note (Signed)
Continue with Cpap 

## 2022-01-17 NOTE — Consult Note (Addendum)
Advanced Heart Failure Team Consult Note   Primary Physician: [name], NP Cardiologist:  Buford Dresser, MD HF: Dr. [name]  Reason for Consultation: Acute on chronic biventricular HF  HPI:    [name] is seen today for evaluation of acute on chronic biventricular heart failure at the request of Emergency Medicine physician.   66 y.o. with a history of morbid obesity, chronic diastolic and systolic HF with mildly reduced LVEF (45-50%), severe MR/TR, atrial fibrillation on Eliquis, HTN, and DMII.    She has a known history of HF with mildly reduced LVEF of 45-50% with cath in 2019 with mild, non-obstructive CAD. Also with history of difficult to control Afib with failed Tikosyn, later switched to amiodarone, dig, metop and apixaban for [facility].    Admitted 5//22 with worsening HF.  LHC 5/19 non-obstructive CAD. RHC 12/22/20 with elevated filling pressures and low CI.    Underwent MV/TV repair with Maze on 12/30/20. Post-operatively, she struggled  with low output progressive renal failure and volume overload.  Post operative Echo showed LVEF 55% RV moderately HK. Moderate TR. No significant MR. D/c wt was 281 lb. SCr was 2.8. She was also in Afib day of d/c.    Admitted 6/22 for A/CHF and AF diuresed with IV lasix, RHC showed low filling pressures and moderately reduced CO. She underwent successful DCCV 02/01/21. 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    Seen in ED 02/07/21 fShe was back in Afib, rate controlled. She was loaded w amiodarone gtt, successful DCCV on 02/09/21.    Back in AF 7/22. Repeat echo showed new reduction in EF, in setting of AFL with RVR,  EF 30-35%, moderate LVH, moderate RV dysfunction. Transitioned to po amio and torsemide, discharge weight 271 lbs.   S/p DCCV 04/19/21, but back in afib at follow up a week later at [facility] appointment.    Admitted 9/22 with CP and AKI on CKD IV. SCr 3.12 in ED, likely due to over diuresis. discharge, weight  268 lbs.   Admitted 1/23 x 2  with respiratory distress due to HF and COVID, Had recurrent AF. Echo EF 45%   Admitted 4/23 with recurrent HF in setting of recurrent AF. Echo 35-40% RV moderately down severe TR. [place]inal 11/24/21 notable for severely elevated biventricular pressures. Started on milrinone and IV lasix.    Underwent DC-CV to NSR on 11/25/21. Loaded with IV amio. Developed severe stuttering. Code Stroke called. CT/MRI normal. Resolved spontaneously. Developed AKI. Discharged home 12/07/21. Scr 2.9. Wt 295   RHC 04/23: RA = 29 RV = 65/30 PA = 67/37 (45) PCW = 30 (v waves to 45) Fick cardiac output/index = 6.2/2.5 PVR = 2.4 WU Ao sat = 93% PA sat = 50%, 55% PAPi = 1.0   Seen in HF clinic for f/u 12/14/21. Volume looked okay.  Maintaining SR.  Saw her regular cardiologist, Dr. Harrell Gave 12/19/21. She was in atrial flutter and referred back to EP. Torsemide increased. She had Cardiology follow-up 05/16 and 05/24 for volume overload. Torsemide had been increased to TID + taking metolazone 3 X weekly. Plan was for admission if continued to worsen.  She saw Dr. Quentin Ore in the clinic for EP follow-up today. ECG sinus rhythm with PVCs. Appeared significantly volume overloaded. She was directed to the ED, it was felt she would likely require admission for IV diuresis.  Reports worsening dyspnea, orthopnea and LE edema X 2 weeks. Chest feels heavy. Gets winded walking across the  room. Reports following fluid and sodium restriction and taking meds as prescribed. Notes 10 lb weight gain. Home weight 307 lb today.   Review of Systems: [y] = yes, [ ]  = no   General: Weight gain [Y]; Weight loss [ ] ; Anorexia [ ] ; Fatigue [Y]; Fever [ ] ; Chills [ ] ; Weakness [ ]   Cardiac: Chest pain/pressure [Y]; Resting SOB [ ] ; Exertional SOB [Y]; Orthopnea [Y]; Pedal Edema [Y]; Palpitations [ ] ; Syncope [ ] ; Presyncope [ ] ; Paroxysmal nocturnal dyspnea[ ]   Pulmonary: Cough [ ] ; Wheezing[ ] ; Hemoptysis[ ] ;  Sputum [ ] ; Snoring [ ]   GI: Vomiting[ ] ; Dysphagia[ ] ; Melena[ ] ; Hematochezia [ ] ; Heartburn[ ] ; Abdominal pain [ ] ; Constipation [ ] ; Diarrhea [ ] ; BRBPR [ ]   GU: Hematuria[ ] ; Dysuria [ ] ; Nocturia[ ]   Vascular: Pain in legs with walking [ ] ; Pain in feet with lying flat [ ] ; Non-healing sores [ ] ; Stroke [ ] ; TIA [ ] ; Slurred speech [ ] ;  Neuro: Headaches[ ] ; Vertigo[ ] ; Seizures[ ] ; Paresthesias[ ] ;Blurred vision [ ] ; Diplopia [ ] ; Vision changes [ ]   Ortho/Skin: Arthritis [ ] ; Joint pain [ ] ; Muscle pain [ ] ; Joint swelling [ ] ; Back Pain [ ] ; Rash [ ]   Psych: Depression[ ] ; Anxiety[ ]   Heme: Bleeding problems [ ] ; Clotting disorders [ ] ; Anemia [ ]   Endocrine: Diabetes [Y]; Thyroid dysfunction[ ]   Home Medications Prior to Admission medications   Medication Sig Start Date End Date Taking? Authorizing Provider  acetaminophen (TYLENOL) 325 MG tablet Take 2 tablets (650 mg total) by mouth every 6 (six) hours as needed for mild pain or moderate pain. 12/07/21   Arrien, Jimmy Picket, MD  albuterol (VENTOLIN HFA) 108 (90 Base) MCG/ACT inhaler Inhale 1-2 puffs into the lungs every 6 (six) hours as needed for wheezing or shortness of breath. 12/29/21   [name], NP  Alcohol Swabs (B-D SINGLE USE SWABS REGULAR) PADS 1 each by Other route daily. 07/06/21   [name], NP  Alcohol Swabs PADS 1 application by Does not apply route in the morning, at noon, and at bedtime. 09/14/21   [name], NP  amiodarone (PACERONE) 200 MG tablet Take 1 tablet (200 mg total) by mouth daily. 09/27/21   Vickie Epley, MD  amoxicillin-clavulanate (AUGMENTIN) 875-125 MG tablet Take 1 tablet by mouth 2 (two) times daily. 12/29/21   [name], NP  apixaban (ELIQUIS) 5 MG TABS tablet Take 1 tablet (5 mg total) by mouth 2 (two) times daily. 05/16/21   Rafael Bihari, FNP  blood glucose meter kit and supplies KIT Dispense based on patient and insurance preference. Use up to four  times daily as directed. 02/10/21   Patrecia Pour, MD  blood glucose meter kit and supplies Dispense based on patient and insurance preference. Use up to four times daily as directed. (FOR ICD-10 E10.9, E11.9). 07/06/21   [name], NP  Blood Glucose Monitoring Suppl (ACCU-CHEK GUIDE ME) w/Device KIT Use as instructed to check blood sugar three times daily. E11.69 04/21/20   Charlott Rakes, MD  busPIRone (BUSPAR) 5 MG tablet Take 1 tablet (5 mg total) by mouth 2 (two) times daily. 09/14/21   [name], NP  colchicine 0.6 MG tablet Take 0.5 tablets (0.3 mg total) by mouth daily. 12/08/21   Charlott Rakes, MD  docusate sodium (COLACE) 100 MG capsule Take 100 mg by mouth 2 (two) times daily.    [provider]  Dulaglutide (TRULICITY) 1.5 EL/3.8BO SOPN Inject 1.5 mg into the skin once a week. Patient taking differently: Inject 1.5 mg into the skin every Wednesday. 10/19/21 01/17/22  [name], NP  DULoxetine (CYMBALTA) 60 MG capsule Take 1 capsule (60 mg total) by mouth daily. 09/14/21   [name], NP  EPINEPHrine 0.3 mg/0.3 mL IJ SOAJ injection Inject 0.3 mg into the muscle as needed for anaphylaxis. 06/25/20   [name], NP  fluticasone (FLONASE) 50 MCG/ACT nasal spray Place 2 sprays into both nostrils daily. 12/29/21   [name], NP  glucose blood (ACCU-CHEK GUIDE) test strip Use to check blood sugar TID. E11.69 07/13/21   Charlott Rakes, MD  insulin aspart (NOVOLOG) 100 UNIT/ML injection Inject 2-10 Units into the skin 3 (three) times daily before meals. Per sliding scale 09/02/21   Aline August, MD  lidocaine (XYLOCAINE) 5 % ointment Apply 1 application. topically as needed for moderate pain. First choice is lidocaine patch, but if not covered/expensive, please fill ointment Patient taking differently: Apply 1 application. topically 2 (two) times daily as needed for moderate pain. 11/08/21   Buford Dresser, MD  loratadine  (CLARITIN) 10 MG tablet Take 1 tablet (10 mg total) by mouth daily. 12/29/21   [name], NP  metolazone (ZAROXOLYN) 2.5 MG tablet Patient takes 1 tablet by mouth on Monday Wednesday and Friday.    [provider]  metoprolol succinate (TOPROL-XL) 25 MG 24 hr tablet Take 1 tablet (25 mg total) by mouth in the morning and at bedtime. 01/02/22   Geofrey Silliman, Shaune Pascal, MD  Multiple Vitamins-Minerals (MULTIVITAMIN WOMEN PO) Take 1 tablet by mouth daily.    [provider]  nitroGLYCERIN (NITROSTAT) 0.4 MG SL tablet Place 1 tablet (0.4 mg total) under the tongue every 5 (five) minutes x 3 doses as needed for chest pain. 01/08/14   Charolette Forward, MD  ondansetron (ZOFRAN-ODT) 4 MG disintegrating tablet Take 1 tablet (4 mg total) by mouth every 8 (eight) hours as needed for nausea or vomiting. 10/17/21   Isla Pence, MD  pantoprazole (PROTONIX) 40 MG tablet Take 1 tablet (40 mg total) by mouth daily. 09/14/21   [name], NP  potassium chloride SA (KLOR-CON M) 20 MEQ tablet Take 2 tablets (40 mEq total) by mouth daily. Take an additional 20 meq with every dose of METOLAZONE 12/19/21   Konya Fauble, Shaune Pascal, MD  RESTASIS 0.05 % ophthalmic emulsion Place 1 drop into both eyes 2 (two) times daily. 11/19/17   [provider]  rosuvastatin (CRESTOR) 10 MG tablet Take 1 tablet (10 mg total) by mouth every evening. 09/02/21   Aline August, MD  torsemide (DEMADEX) 20 MG tablet Take 2 tablets (40 mg total) by mouth 3 (three) times daily. 01/03/22   Buford Dresser, MD    Past Medical History: Past Medical History:  Diagnosis Date   Acute on chronic systolic CHF (congestive heart failure) ([place]) 12/21/2020   Allergic rhinitis    Arthritis    Asthma    Chest pain 12/27/2017   Chronic diastolic CHF (congestive heart failure) ([facility])    COPD (chronic obstructive pulmonary disease) ([facility])    Depression    DM (diabetes mellitus) ([place])    DVT (deep vein thrombosis) in  pregnancy    Dysrhythmia    atrial fibrilation   GERD (gastroesophageal reflux disease)    Headache(784.0)    HTN (hypertension)    Hyperlipidemia    Obesity    Pneumonia  04/2018   RIGHT LOBE   Shortness of breath    Sleep apnea    compliant with CPAP    Past Surgical History: Past Surgical History:  Procedure Laterality Date   ABDOMINAL HYSTERECTOMY     BUBBLE STUDY  11/26/2020   Procedure: BUBBLE STUDY;  Surgeon: Buford Dresser, MD;  Location: [place];  Service: Cardiovascular;;   CARDIOVERSION N/A 05/06/2020   Procedure: CARDIOVERSION;  Surgeon: Buford Dresser, MD;  Location: [place];  Service: Cardiovascular;  Laterality: N/A;   CARDIOVERSION N/A 11/04/2020   Procedure: CARDIOVERSION;  Surgeon: Lelon Perla, MD;  Location: [place];  Service: Cardiovascular;  Laterality: N/A;   CARDIOVERSION N/A 02/01/2021   Procedure: CARDIOVERSION;  Surgeon: Jolaine Artist, MD;  Location: [place];  Service: Cardiovascular;  Laterality: N/A;   CARDIOVERSION N/A 02/09/2021   Procedure: CARDIOVERSION;  Surgeon: Jolaine Artist, MD;  Location: [facility] ENDOSCOPY;  Service: Cardiovascular;  Laterality: N/A;   CARDIOVERSION N/A 04/19/2021   Procedure: CARDIOVERSION;  Surgeon: Fay Records, MD;  Location: [place];  Service: Cardiovascular;  Laterality: N/A;   CARDIOVERSION N/A 11/25/2021   Procedure: CARDIOVERSION;  Surgeon: Jolaine Artist, MD;  Location: [place];  Service: Cardiovascular;  Laterality: N/A;   CATARACT EXTRACTION, BILATERAL     CHOLECYSTECTOMY     COLONOSCOPY WITH PROPOFOL N/A 03/05/2015   Procedure: COLONOSCOPY WITH PROPOFOL;  Surgeon: Carol Ada, MD;  Location: [facility] ENDOSCOPY;  Service: Endoscopy;  Laterality: N/A;   DIAGNOSTIC LAPAROSCOPY     ingrown hallux Left    KNEE SURGERY     LEFT HEART CATH AND CORONARY ANGIOGRAPHY N/A 12/31/2017   Procedure: LEFT HEART CATH AND CORONARY ANGIOGRAPHY;  Surgeon: Charolette Forward, MD;   Location: [place] CV LAB;  Service: Cardiovascular;  Laterality: N/A;   MAZE N/A 12/29/2020   Procedure: MAZE;  Surgeon: [name], MD;  Location: [place];  Service: Open Heart Surgery;  Laterality: N/A;   MITRAL VALVE REPAIR N/A 12/29/2020   Procedure: MITRAL VALVE REPAIR USING CARBOMEDICS ANNULOFLEX RING SIZE 30;  Surgeon: [name], MD;  Location: [place];  Service: Open Heart Surgery;  Laterality: N/A;   RIGHT HEART CATH N/A 12/22/2020   Procedure: RIGHT HEART CATH;  Surgeon: Larey Dresser, MD;  Location: [place] CV LAB;  Service: Cardiovascular;  Laterality: N/A;   RIGHT HEART CATH N/A 01/31/2021   Procedure: RIGHT HEART CATH;  Surgeon: Jolaine Artist, MD;  Location: [place] CV LAB;  Service: Cardiovascular;  Laterality: N/A;   RIGHT HEART CATH N/A 07/29/2021   Procedure: RIGHT HEART CATH;  Surgeon: Jolaine Artist, MD;  Location: [place] CV LAB;  Service: Cardiovascular;  Laterality: N/A;   RIGHT HEART CATH N/A 11/24/2021   Procedure: RIGHT HEART CATH;  Surgeon: Jolaine Artist, MD;  Location: [place] CV LAB;  Service: Cardiovascular;  Laterality: N/A;   TEE WITHOUT CARDIOVERSION N/A 11/26/2020   Procedure: TRANSESOPHAGEAL ECHOCARDIOGRAM (TEE);  Surgeon: Buford Dresser, MD;  Location: [facility] ENDOSCOPY;  Service: Cardiovascular;  Laterality: N/A;   TEE WITHOUT CARDIOVERSION N/A 12/29/2020   Procedure: TRANSESOPHAGEAL ECHOCARDIOGRAM (TEE);  Surgeon: [name], MD;  Location: [place];  Service: Open Heart Surgery;  Laterality: N/A;   TRICUSPID VALVE REPLACEMENT N/A 12/29/2020   Procedure: 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 RING SIZE 34;  Surgeon: [name], MD;  Location: [place];  Service: Open Heart Surgery;  Laterality: N/A;    Family History: Family History  Problem Relation Age of Onset   Breast cancer Mother    Cancer Mother    Hypertension Mother    Cancer Father    Hypertension Sister     Hypertension Brother    CAD Other     Social History: Social History   Socioeconomic History   Marital status: Significant Other    Spouse name: Not on file   Number of children: 2   Years of education: Not on file   Highest education level: Some college, no degree  Occupational History   Not on file  Tobacco Use   Smoking status: Never   Smokeless tobacco: Never  Vaping Use   Vaping Use: Never used  Substance and Sexual Activity   Alcohol use: No   Drug use: No   Sexual activity: Not Currently  Other Topics Concern   Not on file  Social History Narrative   Pt lives in [place] with spouse.  2 grown children.   Previously worked in [employer] at [employer].  Now on disability   Social Determinants of Health   Financial Resource Strain: Low Risk    Difficulty of Paying Living Expenses: Not hard at all  Food Insecurity: No Food Insecurity   Worried About Charity fundraiser in the Last Year: Never true   [place] in the Last Year: Never true  Transportation Needs: No Transportation Needs   Lack of Transportation (Medical): No   Lack of Transportation (Non-Medical): No  Physical Activity: Insufficiently Active   Days of Exercise per Week: 2 days   Minutes of Exercise per Session: 70 min  Stress: No Stress Concern Present   Feeling of Stress : Not at all  Social Connections: Socially Isolated   Frequency of Communication with Friends and Family: More than three times a week   Frequency of Social Gatherings with Friends and Family: Three times a week   Attends Religious Services: Never   Active Member of Clubs or Organizations: No   Attends Archivist Meetings: Never   Marital Status: Widowed    Allergies:  Allergies  Allergen Reactions   Bee Pollen Anaphylaxis   Sulfa Antibiotics Itching    Objective:    Vital Signs:   BP: ()/()  Arterial Line BP: ()/()     Weight change: There were no vitals filed for this visit.  Intake/Output:  No  intake or output data in the 24 hours ending 01/17/22 1305    Physical Exam    General:  Comfortable at rest, appears fatigued. HEENT: normal Neck: supple. JVP to ear . Carotids 2+ bilat; no bruits.  Cor: PMI nondisplaced. Regular rate & rhythm. No rubs, gallops, 2/6 TR murmur Lungs: bibasilar crackles Abdomen: soft, nontender, + distended.  Extremities: no cyanosis, clubbing, rash, 2+ edema Neuro: alert & orientedx3, cranial nerves grossly intact. moves all 4 extremities w/o difficulty. Affect pleasant   Telemetry   Not currently set up  EKG    SR 84 bpm, PVCs  Labs   Basic Metabolic Panel: Recent Labs  Lab 01/11/22 1134  NA 142  K 3.7  CL 103  CO2 23  GLUCOSE 100*  BUN 20  CREATININE 2.29*  CALCIUM 9.3    Liver Function Tests: Recent Labs  Lab 01/11/22 1134  AST 21  ALT 17  ALKPHOS 171*  BILITOT 0.6  PROT 7.9  ALBUMIN 4.1   No results for input(s): LIPASE, AMYLASE in the last 168 hours. No results for input(s): AMMONIA  in the last 168 hours.  CBC: No results for input(s): WBC, NEUTROABS, HGB, HCT, MCV, PLT in the last 168 hours.  Cardiac Enzymes: No results for input(s): CKTOTAL, CKMB, CKMBINDEX, TROPONINI in the last 168 hours.  BNP: BNP (last 3 results) Recent Labs    11/21/21 1122 12/14/21 1208 01/11/22 1134  BNP 534.8* 184.4* 304.3*    ProBNP (last 3 results) No results for input(s): PROBNP in the last 8760 hours.   CBG: No results for input(s): GLUCAP in the last 168 hours.  Coagulation Studies: No results for input(s): LABPROT, INR in the last 72 hours.   Imaging   No results found.   Medications:     Current Medications:   Infusions:     Patient Profile   66 y.o. female with history of chronic biventricular systolic CHF, PAF/AFL, CKD IV, CAD, valvular heart disease, DMII, OSA. Multiple admissions for decompensated HF mostly in setting of recurrent AF.   Now presenting with a/c CHF.   Assessment/Plan   1.  Acute on chronic Biventricular Systolic Heart Failure: - RHC (6/22) with low filling pressures with moderately reduced CO. - Echo (7/22) in setting of AFL with RVR, showed EF 30-35%, moderate LVH, moderate RV dysfunction, PASP 48, s/p MV repair with mild MR and mean gradient 10 mmHg with HR around 130, s/p TV repair with mild-moderate TR, dilated IVC.   - RHC 4/23 markedly elevated biventricular pressures - Echo 4/23 35-40% RV moderately down severe TR. Moderate MS mean grad 14mm HG - Multiple admissions recently for HF mostly occur in setting of recurrent AF. Currently in NSR. - NYHA IIIb/IV. Seen in ED triage. Appears volume overloaded.  Give IV lasix 80 mg X 1 once arrives to room, then start lasix gtt at 20 hr (home regimen: Torsemide 40 mg TID + 2.5 mg metolazone M, W, F).  - May need addition of milrinone for RV support if sluggish diuresis. No PICC with stage IV CKD. - Intolerant of AF. Need to maintain SR. SR on ECG today.  - GDMT limited by AKI on CKD, eventually consider spiro and SGLT2i (vs hdl/ntg) - Continue toprol xl 25 mg bid - Ordered labs - UNNA boots - Limit fluid intake - May need repeat RHC +/- TEE this admit   2. PAF/AFL - She has failed Tikosyn. - She had Maze in 5/22 and DCCV in 6/22 & 04/19/21, last of which she had ERAF. - seen by EP 10/22. Considering ablation vs PPM/AV nodal ablation if AF/AFL returns - Multiple admissions recently for HF mostly occur in setting of recurrent AF. Last DC-CV 11/24/21 - AFL at f/u 05/01, back in SR at f/u 05/16. SR today.  - Continue amiodarone.  - Unable to tolerate AF at all. Follows with EP. Not a candidate for Afib ablation or AV nodal ablation/PPM - Has OSA, awaiting split night study for CPAP titration.   3. CKD Stage IV   - New baseline creatinine  ~ 2.6 - 2.8, most recently 2.3 on 05/24 - Consider SGLT2i if maintaining GFR> 20  - Follows with Nephrology - Avoid hypotension   4. CAD - Cath (2019): Mild nonobstructive CAD -   No s/s angina - Continue statin + beta blocker - no ASA w/ Eliquis    5. Valvular Heart Disease: - S/p MV and TV repair in 5/22.  - Echo 02/25/21 with mild-moderate TR.   - Echo 4/23 35-40% RV moderately down. severe TR. Moderate MS mean gradient 76mmHG  - May  need to repeat TEE and consider TV clip.   6. Obesity - Body mass index is 44.83 kg/m.   7. DMII - Hgb A1c 6.7% 04/23 - GFR has been too low for SGLT2i   8. OSA - initial sleep study positive but was awaiting split night study to titrate CPAP - Has been referred to Dr. Radford Pax - suspect this is part of her AF recurrence   Recommend admitting under hospitalist service. HF service will continue to follow.  Length of Stay: 0  FINCH, LINDSAY N, PA-C  01/17/2022, 1:05 PM  Advanced Heart Failure Team Pager [phone] (M-F; 7a - 5p)  Please contact [facility] for night-coverage after hours (4p -7a ) and weekends on [email or web address]   Patient seen and examined with the above-signed Advanced Practice Provider and/or Housestaff. I personally reviewed laboratory data, imaging studies and relevant notes. I independently examined the patient and formulated the important aspects of the plan. I have edited the note to reflect any of my changes or salient points. I have personally discussed the plan with the patient and/or family.  66 y/o patient with history as above.   Presents with recurrent volume overload refractory to increasing oral diuretics. Diuresis has been limited in past due to low output from severe TR and well as AKI in setting of cardiorenal syndrome. Fortunately she remains in NSR today.  General:  Sitting up in bed. No resp difficulty HEENT: normal Neck: supple. JVP to earCarotids 2+ bilat; no bruits. No lymphadenopathy or thryomegaly appreciated. Cor: PMI nondisplaced. Regular rate & rhythm. 3/6 TR Lungs: clear Abdomen: obese soft, nontender, nondistended. No hepatosplenomegaly. No bruits or masses. Good bowel  sounds. Extremities: no cyanosis, clubbing, rash, 1-2+ edema Neuro: alert & orientedx3, cranial nerves grossly intact. moves all 4 extremities w/o difficulty. Affect pleasant  Will diurese with IV lasix. Watch renal function closely. Will need to reassess tricuspid valve with Structural Team to see if she is candidate for any percutaneous intervention. (Suspect she may not be a candidate with CKD).   Glori Bickers, MD  5:10 PM

## 2022-01-17 NOTE — Assessment & Plan Note (Addendum)
Continue rate control with metoprolol and amiodarone. Continue anticoagulation with apixaban.

## 2022-01-17 NOTE — H&P (Addendum)
History and Physical    [name] QVZ:[id number] DOB: [birth year] DOA: 01/17/2022  DOS: the patient was seen and examined on 01/17/2022  PCP: [name], NP   Patient coming from: Clinic  I have personally briefly reviewed patient's old medical records in [place]  Chief complaint: Shortness of breath History present illness: 66 year old female with a history of biventricular failure, severe tricuspid regurg, type 2 diabetes, super morbid obesity with BMI of 48, with recurrent hospital admissions for heart failure presents to the ER today from the cardiology office.  Patient states that she has been having increasing weight gain, shortness of breath, lower extremity edema for week to 2 weeks.  She states that he takes medications a regular basis.  She states that she has not eating too much salt.  She is not drinking excessive amounts of water.  Patient has not been seen by Dr. [name] with the advanced heart failure team.  She was started on Lasix drip and Unna boots.  [facility] contacted for admission.  Patient denies any chest pain, nausea, vomiting, fever, chills.   ED Course: Patient seen by advanced heart failure team.  Started on Lasix drip.  Unna boots placed.  Review of Systems:  Review of Systems  Constitutional:  Positive for malaise/fatigue.  HENT: Negative.    Eyes: Negative.   Respiratory:  Positive for shortness of breath.   Cardiovascular:  Positive for orthopnea, leg swelling and PND.  Gastrointestinal: Negative.   Genitourinary: Negative.   Musculoskeletal: Negative.   Skin: Negative.   Neurological: Negative.   Endo/Heme/Allergies: Negative.   Psychiatric/Behavioral: Negative.    All other systems reviewed and are negative.  Past Medical History:  Diagnosis Date   Acute on chronic systolic CHF (congestive heart failure) (HCC) 12/21/2020   Allergic rhinitis    Arthritis    Asthma    Chest pain 12/27/2017   Chronic diastolic CHF  (congestive heart failure) (HCC)    COPD (chronic obstructive pulmonary disease) (HCC)    Depression    DM (diabetes mellitus) ([place])    DVT (deep vein thrombosis) in pregnancy    Dysrhythmia    atrial fibrilation   GERD (gastroesophageal reflux disease)    Headache(784.0)    HTN (hypertension)    Hyperlipidemia    Obesity    Pneumonia 04/2018   RIGHT LOBE   Shortness of breath    Sleep apnea    compliant with CPAP    Past Surgical History:  Procedure Laterality Date   ABDOMINAL HYSTERECTOMY     BUBBLE STUDY  11/26/2020   Procedure: BUBBLE STUDY;  Surgeon: Buford Dresser, MD;  Location: [place];  Service: Cardiovascular;;   CARDIOVERSION N/A 05/06/2020   Procedure: CARDIOVERSION;  Surgeon: Buford Dresser, MD;  Location: [facility] ENDOSCOPY;  Service: Cardiovascular;  Laterality: N/A;   CARDIOVERSION N/A 11/04/2020   Procedure: CARDIOVERSION;  Surgeon: Lelon Perla, MD;  Location: [place];  Service: Cardiovascular;  Laterality: N/A;   CARDIOVERSION N/A 02/01/2021   Procedure: CARDIOVERSION;  Surgeon: Jolaine Artist, MD;  Location: [place];  Service: Cardiovascular;  Laterality: N/A;   CARDIOVERSION N/A 02/09/2021   Procedure: CARDIOVERSION;  Surgeon: Jolaine Artist, MD;  Location: [facility] ENDOSCOPY;  Service: Cardiovascular;  Laterality: N/A;   CARDIOVERSION N/A 04/19/2021   Procedure: CARDIOVERSION;  Surgeon: Fay Records, MD;  Location: [place];  Service: Cardiovascular;  Laterality: N/A;   CARDIOVERSION N/A 11/25/2021   Procedure: CARDIOVERSION;  Surgeon: Jolaine Artist, MD;  Location: [place] ENDOSCOPY;  Service: Cardiovascular;  Laterality: N/A;   CATARACT EXTRACTION, BILATERAL     CHOLECYSTECTOMY     COLONOSCOPY WITH PROPOFOL N/A 03/05/2015   Procedure: COLONOSCOPY WITH PROPOFOL;  Surgeon: Carol Ada, MD;  Location: [facility] ENDOSCOPY;  Service: Endoscopy;  Laterality: N/A;   DIAGNOSTIC LAPAROSCOPY     ingrown hallux Left    KNEE SURGERY      LEFT HEART CATH AND CORONARY ANGIOGRAPHY N/A 12/31/2017   Procedure: LEFT HEART CATH AND CORONARY ANGIOGRAPHY;  Surgeon: Charolette Forward, MD;  Location: [place] CV LAB;  Service: Cardiovascular;  Laterality: N/A;   MAZE N/A 12/29/2020   Procedure: MAZE;  Surgeon: [name], MD;  Location: [place];  Service: Open Heart Surgery;  Laterality: N/A;   MITRAL VALVE REPAIR N/A 12/29/2020   Procedure: MITRAL VALVE REPAIR USING CARBOMEDICS ANNULOFLEX RING SIZE 30;  Surgeon: [name], MD;  Location: [place];  Service: Open Heart Surgery;  Laterality: N/A;   RIGHT HEART CATH N/A 12/22/2020   Procedure: RIGHT HEART CATH;  Surgeon: Larey Dresser, MD;  Location: [place] CV LAB;  Service: Cardiovascular;  Laterality: N/A;   RIGHT HEART CATH N/A 01/31/2021   Procedure: RIGHT HEART CATH;  Surgeon: Jolaine Artist, MD;  Location: [place] CV LAB;  Service: Cardiovascular;  Laterality: N/A;   RIGHT HEART CATH N/A 07/29/2021   Procedure: RIGHT HEART CATH;  Surgeon: Jolaine Artist, MD;  Location: [place] CV LAB;  Service: Cardiovascular;  Laterality: N/A;   RIGHT HEART CATH N/A 11/24/2021   Procedure: RIGHT HEART CATH;  Surgeon: Jolaine Artist, MD;  Location: [place] CV LAB;  Service: Cardiovascular;  Laterality: N/A;   TEE WITHOUT CARDIOVERSION N/A 11/26/2020   Procedure: TRANSESOPHAGEAL ECHOCARDIOGRAM (TEE);  Surgeon: Buford Dresser, MD;  Location: [facility] ENDOSCOPY;  Service: Cardiovascular;  Laterality: N/A;   TEE WITHOUT CARDIOVERSION N/A 12/29/2020   Procedure: TRANSESOPHAGEAL ECHOCARDIOGRAM (TEE);  Surgeon: [name], MD;  Location: [place];  Service: Open Heart Surgery;  Laterality: N/A;   TRICUSPID VALVE REPLACEMENT N/A 12/29/2020   Procedure: 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 RING SIZE 34;  Surgeon: [name], MD;  Location: [place];  Service: Open Heart Surgery;  Laterality: N/A;     reports that she has never smoked. She  has never used smokeless tobacco. She reports that she does not drink alcohol and does not use drugs.  Allergies  Allergen Reactions   Bee Pollen Anaphylaxis   Sulfa Antibiotics Itching    Family History  Problem Relation Age of Onset   Breast cancer Mother    Cancer Mother    Hypertension Mother    Cancer Father    Hypertension Sister    Hypertension Brother    CAD Other     Prior to Admission medications   Medication Sig Start Date End Date Taking? Authorizing Provider  acetaminophen (TYLENOL) 325 MG tablet Take 2 tablets (650 mg total) by mouth every 6 (six) hours as needed for mild pain or moderate pain. 12/07/21  Yes Arrien, Jimmy Picket, MD  albuterol (VENTOLIN HFA) 108 (90 Base) MCG/ACT inhaler Inhale 1-2 puffs into the lungs every 6 (six) hours as needed for wheezing or shortness of breath. 12/29/21  Yes [name], NP  amiodarone (PACERONE) 200 MG tablet Take 1 tablet (200 mg total) by mouth daily. 09/27/21  Yes Vickie Epley, MD  apixaban (ELIQUIS) 5 MG TABS tablet Take 1 tablet (5 mg  total) by mouth 2 (two) times daily. 05/16/21  Yes [place], Maricela Bo, FNP  busPIRone (BUSPAR) 5 MG tablet Take 1 tablet (5 mg total) by mouth 2 (two) times daily. 09/14/21  Yes [name], NP  colchicine 0.6 MG tablet Take 0.5 tablets (0.3 mg total) by mouth daily. 12/08/21  Yes Charlott Rakes, MD  diphenhydrAMINE HCl (BENADRYL PO) Take 1 tablet by mouth daily as needed (itchyness).   Yes [provider]  diphenhydrAMINE-Zinc Acetate (BENADRYL ITCH RELIEF EX) Apply 1 application. topically daily as needed (itchyness).   Yes [provider]  docusate sodium (COLACE) 100 MG capsule Take 100 mg by mouth 2 (two) times daily.   Yes [provider]  Dulaglutide (TRULICITY) 1.5 KG/8.1EH SOPN Inject 1.5 mg into the skin once a week. Patient taking differently: Inject 1.5 mg into the skin every Wednesday. 10/19/21 01/17/22 Yes [name], NP   DULoxetine (CYMBALTA) 60 MG capsule Take 1 capsule (60 mg total) by mouth daily. 09/14/21  Yes [name], NP  EPINEPHrine 0.3 mg/0.3 mL IJ SOAJ injection Inject 0.3 mg into the muscle as needed for anaphylaxis. 06/25/20  Yes [name], NP  fluticasone (FLONASE) 50 MCG/ACT nasal spray Place 2 sprays into both nostrils daily. 12/29/21  Yes [name], NP  insulin aspart (NOVOLOG) 100 UNIT/ML injection Inject 2-10 Units into the skin 3 (three) times daily before meals. Per sliding scale 09/02/21  Yes Aline August, MD  lidocaine (XYLOCAINE) 5 % ointment Apply 1 application. topically as needed for moderate pain. First choice is lidocaine patch, but if not covered/expensive, please fill ointment Patient taking differently: Apply 1 application. topically 2 (two) times daily as needed for moderate pain. 11/08/21  Yes Buford Dresser, MD  loratadine (CLARITIN) 10 MG tablet Take 1 tablet (10 mg total) by mouth daily. 12/29/21  Yes [name], NP  metolazone (ZAROXOLYN) 2.5 MG tablet Take 2.5 mg by mouth 3 (three) times a week. Monday Wednesday and Friday.   Yes [provider]  metoprolol succinate (TOPROL-XL) 25 MG 24 hr tablet Take 1 tablet (25 mg total) by mouth in the morning and at bedtime. 01/02/22  Yes Bensimhon, Shaune Pascal, MD  Multiple Vitamins-Minerals (MULTIVITAMIN WOMEN PO) Take 1 tablet by mouth daily.   Yes [provider]  nitroGLYCERIN (NITROSTAT) 0.4 MG SL tablet Place 1 tablet (0.4 mg total) under the tongue every 5 (five) minutes x 3 doses as needed for chest pain. 01/08/14  Yes Charolette Forward, MD  potassium chloride SA (KLOR-CON M) 20 MEQ tablet Take 2 tablets (40 mEq total) by mouth daily. Take an additional 20 meq with every dose of METOLAZONE 12/19/21  Yes Bensimhon, Shaune Pascal, MD  RESTASIS 0.05 % ophthalmic emulsion Place 1 drop into both eyes 2 (two) times daily. 11/19/17  Yes [provider]  rosuvastatin (CRESTOR) 10 MG  tablet Take 1 tablet (10 mg total) by mouth every evening. 09/02/21  Yes Aline August, MD  torsemide (DEMADEX) 20 MG tablet Take 2 tablets (40 mg total) by mouth 3 (three) times daily. 01/03/22  Yes Buford Dresser, MD  Alcohol Swabs (B-D SINGLE USE SWABS REGULAR) PADS 1 each by Other route daily. 07/06/21   [name], NP  Alcohol Swabs PADS 1 application by Does not apply route in the morning, at noon, and at bedtime. 09/14/21   [name], NP  amoxicillin-clavulanate (AUGMENTIN) 875-125 MG tablet Take 1 tablet by mouth 2 (two) times daily. Patient not  taking: Reported on 01/17/2022 12/29/21   [name], NP  blood glucose meter kit and supplies KIT Dispense based on patient and insurance preference. Use up to four times daily as directed. 02/10/21   Patrecia Pour, MD  blood glucose meter kit and supplies Dispense based on patient and insurance preference. Use up to four times daily as directed. (FOR ICD-10 E10.9, E11.9). 07/06/21   [name], NP  Blood Glucose Monitoring Suppl (ACCU-CHEK GUIDE ME) w/Device KIT Use as instructed to check blood sugar three times daily. E11.69 04/21/20   Charlott Rakes, MD  glucose blood (ACCU-CHEK GUIDE) test strip Use to check blood sugar TID. E11.69 07/13/21   Charlott Rakes, MD  ondansetron (ZOFRAN-ODT) 4 MG disintegrating tablet Take 1 tablet (4 mg total) by mouth every 8 (eight) hours as needed for nausea or vomiting. Patient not taking: Reported on 01/17/2022 10/17/21   Isla Pence, MD  pantoprazole (PROTONIX) 40 MG tablet Take 1 tablet (40 mg total) by mouth daily. 09/14/21   [name], NP    Physical Exam: Vitals:   01/17/22 2030 01/17/22 2100 01/17/22 2120 01/17/22 2140  BP: (!) 144/58 137/64 (!) 132/99 102/73  Pulse: 85 85 85 87  Resp: $Remo'18 18 19 19  'phSWp$ Temp:      TempSrc:      SpO2: 100% 100% 100% 100%  Weight:      Height:        Physical Exam Vitals and nursing note reviewed.  Constitutional:       General: She is not in acute distress.    Appearance: She is obese. She is not ill-appearing, toxic-appearing or diaphoretic.  HENT:     Head: Normocephalic and atraumatic.     Nose: Nose normal.  Eyes:     General: No scleral icterus. Cardiovascular:     Rate and Rhythm: Normal rate and regular rhythm.     Heart sounds: Murmur heard.     Comments: 3/6 LLSB systolic/diastolic murmur Pulmonary:     Effort: Pulmonary effort is normal.     Breath sounds: No wheezing or rales.  Abdominal:     General: There is no distension.     Tenderness: There is no abdominal tenderness. There is no guarding.     Comments: Morbidly obese  Skin:    General: Skin is warm and dry.     Capillary Refill: Capillary refill takes less than 2 seconds.     Comments: No pitting edema in her thighs, abdominal flanks.  Patient has Unna boots on bilateral lower legs.  +1 to +2 pitting pedal edema.  Neurological:     General: No focal deficit present.     Mental Status: She is alert and oriented to person, place, and time.     Labs on Admission: I have personally reviewed following labs and imaging studies  CBC: Recent Labs  Lab 01/17/22 1350  WBC 4.6  HGB 8.7*  HCT 29.2*  MCV 89.3  PLT 196   Basic Metabolic Panel: Recent Labs  Lab 01/11/22 1134 01/17/22 1350  NA 142 141  K 3.7 3.3*  CL 103 106  CO2 23 27  GLUCOSE 100* 116*  BUN 20 25*  CREATININE 2.29* 2.57*  CALCIUM 9.3 9.2  MG  --  1.9   GFR: Estimated Creatinine Clearance: 31.9 mL/min (A) (by C-G formula based on SCr of 2.57 mg/dL (H)). Liver Function Tests: Recent Labs  Lab 01/11/22 1134 01/17/22 1350  AST 21 23  ALT  17 15  ALKPHOS 171* 132*  BILITOT 0.6 0.8  PROT 7.9 7.6  ALBUMIN 4.1 3.4*   No results for input(s): LIPASE, AMYLASE in the last 168 hours. No results for input(s): AMMONIA in the last 168 hours. Coagulation Profile: No results for input(s): INR, PROTIME in the last 168 hours. Cardiac Enzymes: Recent  Labs  Lab 01/17/22 1829 01/17/22 2045  TROPONINIHS 5 8   BNP (last 3 results) No results for input(s): PROBNP in the last 8760 hours. HbA1C: No results for input(s): HGBA1C in the last 72 hours. CBG: No results for input(s): GLUCAP in the last 168 hours. Lipid Profile: No results for input(s): CHOL, HDL, LDLCALC, TRIG, CHOLHDL, LDLDIRECT in the last 72 hours. Thyroid Function Tests: No results for input(s): TSH, T4TOTAL, FREET4, T3FREE, THYROIDAB in the last 72 hours. Anemia Panel: No results for input(s): VITAMINB12, FOLATE, FERRITIN, TIBC, IRON, RETICCTPCT in the last 72 hours. Urine analysis:    Component Value Date/Time   COLORURINE YELLOW 11/21/2021 1926   APPEARANCEUR CLEAR 11/21/2021 1926   LABSPEC 1.008 11/21/2021 1926   PHURINE 5.0 11/21/2021 1926   GLUCOSEU NEGATIVE 11/21/2021 1926   HGBUR SMALL (A) 11/21/2021 1926   BILIRUBINUR NEGATIVE 11/21/2021 1926   BILIRUBINUR negative 09/24/2019 1742   KETONESUR NEGATIVE 11/21/2021 1926   PROTEINUR 30 (A) 11/21/2021 1926   UROBILINOGEN 1.0 09/24/2019 1742   UROBILINOGEN 1.0 10/26/2014 0249   NITRITE NEGATIVE 11/21/2021 1926   LEUKOCYTESUR NEGATIVE 11/21/2021 1926    Radiological Exams on Admission: I have personally reviewed images DG Chest 2 View  Result Date: 01/17/2022 CLINICAL DATA:  Shortness of breath with lower extremity swelling and chest pain. History of congestive heart failure, asthma, diabetes and hypertension EXAM: CHEST - 2 VIEW COMPARISON:  CT 11/21/2021.  Radiographs 11/21/2021. FINDINGS: 1404 hours. Stable cardiomegaly post median sternotomy, mitral and tricuspid valve repair and left atrial appendage clipping. Stable mild vascular congestion without overt pulmonary edema. No pleural effusion or pneumothorax. The bones appear unchanged. IMPRESSION: Stable postoperative chest.  No acute cardiopulmonary process. Electronically Signed   By: Richardean Sale M.D.   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  Inpatient status.  Continue with Lasix drip at 20 mg an hour.  Monitor BMP daily.  Concern for possible cardiorenal syndrome.  Advanced heart failure team is ready been consulted.  Patient does not have any pulm edema.  Room air saturations are 100%.  This may be more right-sided heart failure anything else.  Patient complains of lower extremity edema but she really has no thigh edema or flank edema.  She ready has Unna boots on.  Chest x-ray is clear.  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   Severe tricuspid regurgitation Chronic.  Cardiology considering TEE and possible tricuspid clip.  Secondary hypercoagulable state ([place]) Stable.  Continue Eliquis 5 mg twice daily.  Essential hypertension Stable.  Continue Toprol-XL.  25 mg twice daily.  OSA (obstructive sleep apnea) Chronic.  AF (paroxysmal atrial fibrillation) (HCC) Chronic.  Currently in sinus rhythm.  Continue amiodarone 200 mg daily.  Class 3 obesity (HCC) Chronic.  BMI 48.  Weight currently 139.3 kg  Type 2 diabetes mellitus with hyperlipidemia (HCC) Stable.  Continue sliding scale insulin.  Hold Trulicity.  Check A1c.   DVT prophylaxis: Eliquis Code Status: Full Code Family Communication: no family at bedside  Disposition Plan:  return home  Consults called: CHF consult team  Admission status: Inpatient,  progressive   Kristopher Oppenheim, DO [facility] 01/17/2022, 9:46 PM

## 2022-01-17 NOTE — Progress Notes (Signed)
Electrophysiology Office Follow up Visit Note:    Date:  01/17/2022   ID:  [name], DOB 03-Jul-1956, MRN [id number]  PCP:  [name], NP  [facility] Cardiologist:  Buford Dresser, MD  [facility] Electrophysiologist:  Vickie Epley, MD    Interval History:    [name] is a 66 y.o. female who presents for a follow up visit. They were last seen in clinic 09/27/2021.  Since their last appointment, they were admitted 11/2021 with recurrent HF in the setting of recurrent AF. She was started on milrinone and IV lasix. She underwent DCCV and converted to NSR on 11/25/21. She was loaded with IV amio. She then developed severe stuttering that later resolved spontaneously. Her code stroke CT and MRI were normal.  She saw Dr. [name] 12/14/21. She was maintaining NSR on amiodarone. It was noted that she is unable to tolerate AF, so she was referred for EP follow-up to discuss a more durable option.  She most recently followed up in cardiology with Dr. Harrell Gave 01/11/22. There was concern about loss of atrial kick if ablate/pace strategy is needed given how symptomatic she is not in sinus rhythm. She was continued on volume management strategy, with discussion for dialysis if her kidneys no longer tolerate the diuresis.  She is accompanied by a family member. Overall, she is not feeling well. It is difficult for her to breathe. Currently she is on oxygen due to being out of breath after walking here from her car. Walking from her bed to the restroom causes her to become fatigued and short of breath.  Additionally she complains of cough and congestion. She confirms feeling like she is retaining fluid in her abdomen, legs, and hands.  She is unable to sleep well at night, especially when lying flat. This has been ongoing for weeks.  Of note, she has noticed a significant difference between IV diuresis (more effective) and taking torsemide at home.  She denies any  palpitations, or chest pain. No lightheadedness, headaches, syncope.      Past Medical History:  Diagnosis Date   Acute on chronic systolic CHF (congestive heart failure) (HCC) 12/21/2020   Allergic rhinitis    Arthritis    Asthma    Chest pain 12/27/2017   Chronic diastolic CHF (congestive heart failure) (HCC)    COPD (chronic obstructive pulmonary disease) (HCC)    Depression    DM (diabetes mellitus) ([place])    DVT (deep vein thrombosis) in pregnancy    Dysrhythmia    atrial fibrilation   GERD (gastroesophageal reflux disease)    Headache(784.0)    HTN (hypertension)    Hyperlipidemia    Obesity    Pneumonia 04/2018   RIGHT LOBE   Shortness of breath    Sleep apnea    compliant with CPAP    Past Surgical History:  Procedure Laterality Date   ABDOMINAL HYSTERECTOMY     BUBBLE STUDY  11/26/2020   Procedure: BUBBLE STUDY;  Surgeon: Buford Dresser, MD;  Location: [place];  Service: Cardiovascular;;   CARDIOVERSION N/A 05/06/2020   Procedure: CARDIOVERSION;  Surgeon: Buford Dresser, MD;  Location: [facility] ENDOSCOPY;  Service: Cardiovascular;  Laterality: N/A;   CARDIOVERSION N/A 11/04/2020   Procedure: CARDIOVERSION;  Surgeon: Lelon Perla, MD;  Location: [place];  Service: Cardiovascular;  Laterality: N/A;   CARDIOVERSION N/A 02/01/2021   Procedure: CARDIOVERSION;  Surgeon: Jolaine Artist, MD;  Location: [facility] ENDOSCOPY;  Service: Cardiovascular;  Laterality: N/A;  CARDIOVERSION N/A 02/09/2021   Procedure: CARDIOVERSION;  Surgeon: Jolaine Artist, MD;  Location: [facility] ENDOSCOPY;  Service: Cardiovascular;  Laterality: N/A;   CARDIOVERSION N/A 04/19/2021   Procedure: CARDIOVERSION;  Surgeon: Fay Records, MD;  Location: [place];  Service: Cardiovascular;  Laterality: N/A;   CARDIOVERSION N/A 11/25/2021   Procedure: CARDIOVERSION;  Surgeon: Jolaine Artist, MD;  Location: [facility] ENDOSCOPY;  Service: Cardiovascular;  Laterality: N/A;   CATARACT  EXTRACTION, BILATERAL     CHOLECYSTECTOMY     COLONOSCOPY WITH PROPOFOL N/A 03/05/2015   Procedure: COLONOSCOPY WITH PROPOFOL;  Surgeon: Carol Ada, MD;  Location: [facility] ENDOSCOPY;  Service: Endoscopy;  Laterality: N/A;   DIAGNOSTIC LAPAROSCOPY     ingrown hallux Left    KNEE SURGERY     LEFT HEART CATH AND CORONARY ANGIOGRAPHY N/A 12/31/2017   Procedure: LEFT HEART CATH AND CORONARY ANGIOGRAPHY;  Surgeon: Charolette Forward, MD;  Location: [place] CV LAB;  Service: Cardiovascular;  Laterality: N/A;   MAZE N/A 12/29/2020   Procedure: MAZE;  Surgeon: [name], MD;  Location: [place];  Service: Open Heart Surgery;  Laterality: N/A;   MITRAL VALVE REPAIR N/A 12/29/2020   Procedure: MITRAL VALVE REPAIR USING CARBOMEDICS ANNULOFLEX RING SIZE 30;  Surgeon: [name], MD;  Location: [place];  Service: Open Heart Surgery;  Laterality: N/A;   RIGHT HEART CATH N/A 12/22/2020   Procedure: RIGHT HEART CATH;  Surgeon: Larey Dresser, MD;  Location: [place] CV LAB;  Service: Cardiovascular;  Laterality: N/A;   RIGHT HEART CATH N/A 01/31/2021   Procedure: RIGHT HEART CATH;  Surgeon: Jolaine Artist, MD;  Location: [place] CV LAB;  Service: Cardiovascular;  Laterality: N/A;   RIGHT HEART CATH N/A 07/29/2021   Procedure: RIGHT HEART CATH;  Surgeon: Jolaine Artist, MD;  Location: [place] CV LAB;  Service: Cardiovascular;  Laterality: N/A;   RIGHT HEART CATH N/A 11/24/2021   Procedure: RIGHT HEART CATH;  Surgeon: Jolaine Artist, MD;  Location: [place] CV LAB;  Service: Cardiovascular;  Laterality: N/A;   TEE WITHOUT CARDIOVERSION N/A 11/26/2020   Procedure: TRANSESOPHAGEAL ECHOCARDIOGRAM (TEE);  Surgeon: Buford Dresser, MD;  Location: [facility] ENDOSCOPY;  Service: Cardiovascular;  Laterality: N/A;   TEE WITHOUT CARDIOVERSION N/A 12/29/2020   Procedure: TRANSESOPHAGEAL ECHOCARDIOGRAM (TEE);  Surgeon: [name], MD;  Location: [place];  Service: Open Heart  Surgery;  Laterality: N/A;   TRICUSPID VALVE REPLACEMENT N/A 12/29/2020   Procedure: 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 RING SIZE 34;  Surgeon: [name], MD;  Location: [place];  Service: Open Heart Surgery;  Laterality: N/A;    Current Medications: Current Meds  Medication Sig   acetaminophen (TYLENOL) 325 MG tablet Take 2 tablets (650 mg total) by mouth every 6 (six) hours as needed for mild pain or moderate pain.   albuterol (VENTOLIN HFA) 108 (90 Base) MCG/ACT inhaler Inhale 1-2 puffs into the lungs every 6 (six) hours as needed for wheezing or shortness of breath.   Alcohol Swabs (B-D SINGLE USE SWABS REGULAR) PADS 1 each by Other route daily.   Alcohol Swabs PADS 1 application by Does not apply route in the morning, at noon, and at bedtime.   amiodarone (PACERONE) 200 MG tablet Take 1 tablet (200 mg total) by mouth daily.   amoxicillin-clavulanate (AUGMENTIN) 875-125 MG tablet Take 1 tablet by mouth 2 (two) times daily.   apixaban (ELIQUIS) 5 MG TABS tablet Take 1 tablet (5 mg  total) by mouth 2 (two) times daily.   blood glucose meter kit and supplies KIT Dispense based on patient and insurance preference. Use up to four times daily as directed.   blood glucose meter kit and supplies Dispense based on patient and insurance preference. Use up to four times daily as directed. (FOR ICD-10 E10.9, E11.9).   Blood Glucose Monitoring Suppl (ACCU-CHEK GUIDE ME) w/Device KIT Use as instructed to check blood sugar three times daily. E11.69   busPIRone (BUSPAR) 5 MG tablet Take 1 tablet (5 mg total) by mouth 2 (two) times daily.   colchicine 0.6 MG tablet Take 0.5 tablets (0.3 mg total) by mouth daily.   docusate sodium (COLACE) 100 MG capsule Take 100 mg by mouth 2 (two) times daily.   Dulaglutide (TRULICITY) 1.5 WH/6.7RF SOPN Inject 1.5 mg into the skin once a week. (Patient taking differently: Inject 1.5 mg into the skin every Wednesday.)   DULoxetine (CYMBALTA) 60 MG  capsule Take 1 capsule (60 mg total) by mouth daily.   EPINEPHrine 0.3 mg/0.3 mL IJ SOAJ injection Inject 0.3 mg into the muscle as needed for anaphylaxis.   fluticasone (FLONASE) 50 MCG/ACT nasal spray Place 2 sprays into both nostrils daily.   glucose blood (ACCU-CHEK GUIDE) test strip Use to check blood sugar TID. E11.69   insulin aspart (NOVOLOG) 100 UNIT/ML injection Inject 2-10 Units into the skin 3 (three) times daily before meals. Per sliding scale   lidocaine (XYLOCAINE) 5 % ointment Apply 1 application. topically as needed for moderate pain. First choice is lidocaine patch, but if not covered/expensive, please fill ointment (Patient taking differently: Apply 1 application. topically 2 (two) times daily as needed for moderate pain.)   loratadine (CLARITIN) 10 MG tablet Take 1 tablet (10 mg total) by mouth daily.   metolazone (ZAROXOLYN) 2.5 MG tablet Patient takes 1 tablet by mouth on Monday Wednesday and Friday.   metoprolol succinate (TOPROL-XL) 25 MG 24 hr tablet Take 1 tablet (25 mg total) by mouth in the morning and at bedtime.   Multiple Vitamins-Minerals (MULTIVITAMIN WOMEN PO) Take 1 tablet by mouth daily.   nitroGLYCERIN (NITROSTAT) 0.4 MG SL tablet Place 1 tablet (0.4 mg total) under the tongue every 5 (five) minutes x 3 doses as needed for chest pain.   ondansetron (ZOFRAN-ODT) 4 MG disintegrating tablet Take 1 tablet (4 mg total) by mouth every 8 (eight) hours as needed for nausea or vomiting.   pantoprazole (PROTONIX) 40 MG tablet Take 1 tablet (40 mg total) by mouth daily.   potassium chloride SA (KLOR-CON M) 20 MEQ tablet Take 2 tablets (40 mEq total) by mouth daily. Take an additional 20 meq with every dose of METOLAZONE   RESTASIS 0.05 % ophthalmic emulsion Place 1 drop into both eyes 2 (two) times daily.   rosuvastatin (CRESTOR) 10 MG tablet Take 1 tablet (10 mg total) by mouth every evening.   torsemide (DEMADEX) 20 MG tablet Take 2 tablets (40 mg total) by mouth 3 (three)  times daily.     Allergies:   Bee pollen and Sulfa antibiotics   Social History   Socioeconomic History   Marital status: Significant Other    Spouse name: Not on file   Number of children: 2   Years of education: Not on file   Highest education level: Some college, no degree  Occupational History   Not on file  Tobacco Use   Smoking status: Never   Smokeless tobacco: Never  Vaping Use   Vaping  Use: Never used  Substance and Sexual Activity   Alcohol use: No   Drug use: No   Sexual activity: Not Currently  Other Topics Concern   Not on file  Social History Narrative   Pt lives in [place] with spouse.  2 grown children.   Previously worked in [employer] at [employer].  Now on disability   Social Determinants of Health   Financial Resource Strain: Low Risk    Difficulty of Paying Living Expenses: Not hard at all  Food Insecurity: No Food Insecurity   Worried About Charity fundraiser in the Last Year: Never true   [place] in the Last Year: Never true  Transportation Needs: No Transportation Needs   Lack of Transportation (Medical): No   Lack of Transportation (Non-Medical): No  Physical Activity: Insufficiently Active   Days of Exercise per Week: 2 days   Minutes of Exercise per Session: 70 min  Stress: No Stress Concern Present   Feeling of Stress : Not at all  Social Connections: Socially Isolated   Frequency of Communication with Friends and Family: More than three times a week   Frequency of Social Gatherings with Friends and Family: Three times a week   Attends Religious Services: Never   Active Member of Clubs or Organizations: No   Attends Archivist Meetings: Never   Marital Status: Widowed     Family History: The patient's family history includes Breast cancer in her mother; CAD in an other family member; Cancer in her father and mother; Hypertension in her brother, mother, and sister.  ROS:   Please see the history of present illness.     (+) Shortness of breath (+) Fatigue (+) Cough (+) Congestion (+) Insomnia (+) Orthopnea All other systems reviewed and are negative.  EKGs/Labs/Other Studies Reviewed:    The following studies were reviewed today:  11/24/2021  Right Heart Cath Findings:   RA = 29 RV = 65/30 PA = 67/37 (45) PCW = 30 (v waves to 45) Fick cardiac output/index = 6.2/2.5 PVR = 2.4 WU Ao sat = 93% PA sat = 50%, 55% PAPi = 1.0    Assessment: 1. Severely elevated biventricular pressures 2. Moderate pulmonary venous HTN with marked v-waves in PCWP tracing 3. Severe RV failure   Plan/Discussion:   Add milrinone. Continue diuresis.  07/29/2021 Right Heart Cath: Findings:   RA = 5 RV = 36/8 PA = 40/11 (26) PCW = 13 Fick cardiac output/index = 4.3/1.9 PVR = 3.0 WU FA sat = 95% PA sat = 52%, 55%   Assessment: Normal filling pressures with mild PAH Moderately reduced cardiac out likely due in part to low fluid status   Plan/Discussion:    Volume status normal to low. Dyspnea likely not related to HF. Would hold diuretics for now and resume when Scr improving. We will follow as outpatient.    11/22/2021  Echo  1. Left ventricular ejection fraction, by estimation, is 35 to 40%. The  left ventricle has moderately decreased function. The left ventricle  demonstrates global hypokinesis. Left ventricular diastolic function could  not be evaluated. There is the  interventricular septum is flattened in systole and diastole, consistent  with right ventricular pressure and volume overload.   2. Right ventricular systolic function is moderately reduced. The right  ventricular size is moderately enlarged. There is moderately elevated  pulmonary artery systolic pressure. The estimated right ventricular  systolic pressure is 29.7 mmHg.   3. Left atrial  size was severely dilated.   4. Right atrial size was severely dilated.   5. The mitral valve is myxomatous. Trivial mitral valve regurgitation.   Mild mitral stenosis. The mean mitral valve gradient is 8.2 mmHg with  average heart rate of 100 bpm. There is a 30 mm CARBOMEDICS ANNULOFLEX  RING prosthetic annuloplasty ring present   in the mitral position. Procedure Date: 12/29/20.   6. Tricuspid annuloplasty repair. The tricuspid valve is has been  repaired/replaced. Tricuspid valve regurgitation is severe.   7. The aortic valve is tricuspid. Aortic valve regurgitation is not  visualized. No aortic stenosis is present.   8. The inferior vena cava is dilated in size with >50% respiratory  variability, suggesting right atrial pressure of 8 mmHg.   Comparison(s): Mitral gradients and systolic PA pressure are higher,  probably due to rapid ventricular rates. Left ventricular systolic  function is slightly worse, possibly due to the same reason.   11/21/2021  CT Chest  FINDINGS: Cardiovascular: Midline sternotomy. Mitral and tricuspid valve repair. No pericardial fluid.   Mediastinum/Nodes: No axillary or supraclavicular adenopathy. No mediastinal or hilar adenopathy. No pericardial fluid. Esophagus normal.   Lungs/Pleura: Small bilateral pleural effusions. Mild ground-glass opacities in a mosaic pattern. No bronchiectasis. Nodularity.   Upper Abdomen: Limited view of the liver, kidneys, pancreas are unremarkable. Normal adrenal glands.   Musculoskeletal: No aggressive osseous lesion.   IMPRESSION: 1. Mild mosaic ground-glass opacities suggest mild pulmonary edema or atelectasis. 2. Small bilateral pleural effusions new from prior.  07/29/2021 Echo:  1. Left ventricular ejection fraction, by estimation, is 40 to 45%. The  left ventricle has mildly decreased function. The left ventricle  demonstrates global hypokinesis. There is mild left ventricular  hypertrophy. Left ventricular diastolic parameters  are indeterminate.   2. Right ventricular systolic function is mildly reduced. The right  ventricular size is normal. There  is normal pulmonary artery systolic  pressure.   3. Left atrial size was moderately dilated.   4. Right atrial size was mildly dilated.   5. HR 90 bpm. MV peak gradient, 14.1 mmHg. The mean mitral valve gradient  is 6.5 mmHg      . There is a 30 mm prosthetic annuloplasty ring present in the mitral  position.   6. S/p repair with an annuloplasty ring.   7. The aortic valve is normal in structure. Aortic valve regurgitation is  not visualized. No aortic stenosis is present.   8. The inferior vena cava is normal in size with greater than 50%  respiratory variability, suggesting right atrial pressure of 3 mmHg.   Conclusion(s)/Recommendation(s): Compared to prior echo 02/28/2021, PA pressure reduced.    CT Chest 06/21/2021: FINDINGS: Cardiovascular: Cardiomegaly. Prior median sternotomy and valve replacement. Aortic calcifications. No aneurysm.   Mediastinum/Nodes: No mediastinal, hilar, or axillary adenopathy. Trachea and esophagus are unremarkable. Thyroid unremarkable.   Lungs/Pleura: Linear scarring at the left base. No confluent airspace opacities or effusions. No pneumothorax.   Upper Abdomen: Imaging into the upper abdomen demonstrates no acute findings.   Musculoskeletal: Chest wall soft tissues are unremarkable. No acute bony abnormality.   IMPRESSION: No acute cardiopulmonary disease.   Cardiomegaly.   Aortic Atherosclerosis (ICD10-I70.0).  EKG:  EKG is personally reviewed.  01/17/2022: Sinus rhythm.  Low amplitude QRS.  Recent Labs: 11/22/2021: TSH 2.224 11/29/2021: Magnesium 2.1 12/04/2021: Hemoglobin 8.8; Platelets 232 01/11/2022: ALT 17; BNP 304.3; BUN 20; Creatinine, Ser 2.29; Potassium 3.7; Sodium 142   Recent Lipid Panel  Component Value Date/Time   CHOL 158 05/03/2021 0603   CHOL 140 06/24/2020 1054   TRIG 83 05/03/2021 0603   HDL 50 05/03/2021 0603   HDL 46 06/24/2020 1054   CHOLHDL 3.2 05/03/2021 0603   VLDL 17 05/03/2021 0603   LDLCALC 91  05/03/2021 0603   LDLCALC 75 06/24/2020 1054    Physical Exam:    VS:  BP 136/82   Pulse 83   Ht $R'5\' 7"'ah$  (1.702 m)   Wt (!) 307 lb (139.3 kg)   SpO2 98%   BMI 48.08 kg/m     Wt Readings from Last 3 Encounters:  01/17/22 (!) 307 lb (139.3 kg)  01/11/22 (!) 307 lb 6.4 oz (139.4 kg)  01/03/22 (!) 302 lb 9.6 oz (137.3 kg)     GEN: Obese.  Chronically ill-appearing.  On supplemental oxygen in wheelchair.  In mild distress.   HEENT: Normal NECK: No JVD; No carotid bruits LYMPHATICS: No lymphadenopathy CARDIAC: RRR, no murmurs, rubs, gallops RESPIRATORY:  Clear to auscultation without rales, wheezing or rhonchi; On oxygen  ABDOMEN: Soft, non-tender, non-distended MUSCULOSKELETAL: Significant lower extremity edema; No deformity  SKIN: Warm and dry NEUROLOGIC:  Alert and oriented x 3 PSYCHIATRIC:  Normal affect        ASSESSMENT:    1. Chronic atrial fibrillation ([place])   2. NICM (nonischemic cardiomyopathy) ([place])   3. Chronic combined systolic and diastolic heart failure (HCC)    PLAN:    In order of problems listed above:  #Persistent atrial fibrillation Maintaining sinus rhythm on amiodarone. Not a candidate for catheter ablation. Not a good candidate for AV junction ablation and permanent pacemaker implant.  #Acute on chronic combined systolic and diastolic heart failure Warm and significantly volume overloaded on exam. Had a long discussion with the patient regarding her symptoms.  She think she has a significant amount of fluid on board and her physical exam corroborates this.  I have recommended admission to the hospital for IV diuresis.  I discussed her case with Dr. Kae Heller who is in agreement.  Likely plan for diuresis followed by repeat transesophageal echo of the tricuspid valve.    Total time spent with patient today 45 minutes. This includes reviewing records, evaluating the patient and coordinating care.   Medication Adjustments/Labs and Tests  Ordered: Current medicines are reviewed at length with the patient today.  Concerns regarding medicines are outlined above.  No orders of the defined types were placed in this encounter.  No orders of the defined types were placed in this encounter.   I,Mathew Stumpf,acting as a Education administrator for Vickie Epley, MD.,have documented all relevant documentation on the behalf of Vickie Epley, MD,as directed by  Vickie Epley, MD while in the presence of Vickie Epley, MD.  I, Vickie Epley, MD, have reviewed all documentation for this visit. The documentation on 01/17/22 for the exam, diagnosis, procedures, and orders are all accurate and complete.   Signed, Lars Mage, MD, [facility], [facility] 01/17/2022 12:38 PM    Electrophysiology [facility]

## 2022-01-17 NOTE — ED Notes (Signed)
Purewick applied at this time.

## 2022-01-17 NOTE — ED Notes (Signed)
Attempted to obtain IV x2 without success

## 2022-01-17 NOTE — ED Notes (Signed)
Provider at bedside at this time

## 2022-01-17 NOTE — ED Notes (Signed)
Ortho tech called at this time.  

## 2022-01-17 NOTE — Subjective & Objective (Signed)
Chief complaint: Shortness of breath History present illness: 66 year old female with a history of biventricular failure, severe tricuspid regurg, type 2 diabetes, super morbid obesity with BMI of 48, with recurrent hospital admissions for heart failure presents to the ER today from the cardiology office.  Patient states that she has been having increasing weight gain, shortness of breath, lower extremity edema for week to 2 weeks.  She states that he takes medications a regular basis.  She states that she has not eating too much salt.  She is not drinking excessive amounts of water.  Patient has not been seen by Dr. [name] with the advanced heart failure team.  She was started on Lasix drip and Unna boots.  [facility] hospitalist contacted for admission.  Patient denies any chest pain, nausea, vomiting, fever, chills.

## 2022-01-17 NOTE — Assessment & Plan Note (Signed)
Chronic.  BMI 48.2 OSA continue with Cpap.

## 2022-01-17 NOTE — ED Provider Triage Note (Signed)
Emergency Medicine Provider Triage Evaluation Note  [name] , a 66 y.o. female  was evaluated in triage.  Pt complains of increasing fluid overload over the last month.  Came from PCP office today.  Increasing weakness and shortness of breath.  No recent fevers or upper respiratory infections.  No abdominal pain, nausea, vomiting, headaches, vision changes.  Hx of HTN, CHF, GERD, dysrhythmias, DVTs, depression, headache, COPD, mitral valve repair, tricuspid valve repair.  Review of Systems  Positive:  Negative: As above  Physical Exam  BP (!) 149/108 (BP Location: Left Arm)   Pulse 84   Temp 98.6 F (37 C) (Oral)   Resp 18   Ht '5\' 7"'$  (1.702 m)   Wt (!) 139.3 kg   SpO2 97%   BMI 48.08 kg/m  Gen:   Awake, no distress   Resp:  Normal effort, bilateral rales in the lower lobes MSK:   Moves extremities without difficulty  Other:  Significant edema of the lower extremities, nonpitting.  DP pulses 2+ bilaterally.  Abdomen soft nontender  Medical Decision Making  Medically screening exam initiated at 1:34 PM.  Appropriate orders placed.  ASHONTE ANGELUCCI was informed that the remainder of the evaluation will be completed by another provider, this initial triage assessment does not replace that evaluation, and the importance of remaining in the ED until their evaluation is complete.  Labs, imaging, EKG ordered.  PA from the advanced heart failure team came to see the patient in triage and began treatment regimen   Prince Rome, PA-C 65/78/46 [id number]

## 2022-01-17 NOTE — Assessment & Plan Note (Addendum)
April echocardiogram with reduced LV systolic function 35 to 95% with global hypokinesis, RV with moderate reduction in systolic function. RVSP 46,9 mmHg. Bilateral atrial dilatation. Mild mitral stenosis, severe tricuspid regurgitation.   Acute on chronic core pulmonale.   Urine output is 3,202 ml   Systolic blood pressure is 110 to 113 mmHg.   Continue with torsemide 40 mg po tid Metolazone 2.5 mg three times per week.  Continue with metoprolol.  Follow with TEE report on tricuspid valve evaluation.   Old records were reviewed, patient had mitral and tricuspid valve anuloplasty along with left and right sided maze clipping of the left atrial appendage in May 2022.  Post operative she had multiple hospitalizations for heart failure.   Encourage to get out of bed tid with meals.  PT and OT.

## 2022-01-17 NOTE — Patient Instructions (Addendum)
Medication Instructions:  Your physician recommends that you continue on your current medications as directed. Please refer to the Current Medication list given to you today. *If you need a refill on your cardiac medications before your next appointment, please call your pharmacy*  Lab Work: None. If you have labs (blood work) drawn today and your tests are completely normal, you will receive your results only by: [place] (if you have MyChart) OR A paper copy in the mail If you have any lab test that is abnormal or we need to change your treatment, we will call you to review the results.  Testing/Procedures: None.  Follow-Up: At [facility], you and your health needs are our priority.  As part of our continuing mission to provide you with exceptional heart care, we have created designated Provider Care Teams.  These Care Teams include your primary Cardiologist (physician) and Advanced Practice Providers (APPs -  Physician Assistants and Nurse Practitioners) who all work together to provide you with the care you need, when you need it.  Your physician wants you to follow-up in: Drive to [facility] ER from office today.   We recommend signing up for the patient portal called "MyChart".  Sign up information is provided on this After Visit Summary.  MyChart is used to connect with patients for Virtual Visits (Telemedicine).  Patients are able to view lab/test results, encounter notes, upcoming appointments, etc.  Non-urgent messages can be sent to your provider as well.   To learn more about what you can do with MyChart, go to [email or web address].    Any Other Special Instructions Will Be Listed Below (If Applicable).

## 2022-01-17 NOTE — ED Provider Notes (Addendum)
[facility] EMERGENCY DEPARTMENT Provider Note   CSN: [id number] Arrival date & time: 01/17/22  1303     History  Chief Complaint  Patient presents with   Shortness of Breath   Chest Pain    Leslie DEVENPORT is a 66 y.o. female.   Shortness of Breath Associated symptoms: chest pain   Chest Pain Associated symptoms: shortness of breath    66 year old female with medical history significant for recurrent atrial fibrillation and CHF previously underwent DCCV in April 2023, follows outpatient with Dr. Tempie Hoist of advanced heart failure and EP for her A-fib who presents to the emergency department from cardiology clinic earlier today with concern for CHF exacerbation and volume overload.  Per clinic notes, the patient was warm and significantly volume overloaded on exam with concern for significant fluid overload and need for admission to the hospital for IV diuresis.  The patient endorses continued and worsening bilateral lower extremity edema and worsening dyspnea on exertion and hypopnea.  She endorses some mild chest pressure and discomfort that has been ongoing for several days.  She has been compliant with her outpatient diuretic.  She denies any fevers or chills.  She denies any cough.  Chest pressure is located centrally in the chest with minimal radiation.  Home Medications Prior to Admission medications   Medication Sig Start Date End Date Taking? Authorizing Provider  acetaminophen (TYLENOL) 325 MG tablet Take 2 tablets (650 mg total) by mouth every 6 (six) hours as needed for mild pain or moderate pain. 12/07/21  Yes Arrien, Jimmy Picket, MD  albuterol (VENTOLIN HFA) 108 (90 Base) MCG/ACT inhaler Inhale 1-2 puffs into the lungs every 6 (six) hours as needed for wheezing or shortness of breath. 12/29/21  Yes [name], NP  amiodarone (PACERONE) 200 MG tablet Take 1 tablet (200 mg total) by mouth daily. 09/27/21  Yes Vickie Epley, MD  apixaban  (ELIQUIS) 5 MG TABS tablet Take 1 tablet (5 mg total) by mouth 2 (two) times daily. 05/16/21  Yes [place], Maricela Bo, FNP  busPIRone (BUSPAR) 5 MG tablet Take 1 tablet (5 mg total) by mouth 2 (two) times daily. 09/14/21  Yes [name], NP  colchicine 0.6 MG tablet Take 0.5 tablets (0.3 mg total) by mouth daily. 12/08/21  Yes Charlott Rakes, MD  diphenhydrAMINE HCl (BENADRYL PO) Take 1 tablet by mouth daily as needed (itchyness).   Yes [provider]  diphenhydrAMINE-Zinc Acetate (BENADRYL ITCH RELIEF EX) Apply 1 application. topically daily as needed (itchyness).   Yes [provider]  docusate sodium (COLACE) 100 MG capsule Take 100 mg by mouth 2 (two) times daily.   Yes [provider]  DULoxetine (CYMBALTA) 60 MG capsule Take 1 capsule (60 mg total) by mouth daily. 09/14/21  Yes [name], NP  EPINEPHrine 0.3 mg/0.3 mL IJ SOAJ injection Inject 0.3 mg into the muscle as needed for anaphylaxis. 06/25/20  Yes [name], NP  fluticasone (FLONASE) 50 MCG/ACT nasal spray Place 2 sprays into both nostrils daily. 12/29/21  Yes [name], NP  insulin aspart (NOVOLOG) 100 UNIT/ML injection Inject 2-10 Units into the skin 3 (three) times daily before meals. Per sliding scale 09/02/21  Yes Aline August, MD  lidocaine (XYLOCAINE) 5 % ointment Apply 1 application. topically as needed for moderate pain. First choice is lidocaine patch, but if not covered/expensive, please fill ointment Patient taking differently: Apply 1 application. topically 2 (two) times daily as needed for moderate  pain. 11/08/21  Yes Buford Dresser, MD  loratadine (CLARITIN) 10 MG tablet Take 1 tablet (10 mg total) by mouth daily. 12/29/21  Yes [name], NP  metolazone (ZAROXOLYN) 2.5 MG tablet Take 2.5 mg by mouth 3 (three) times a week. Monday Wednesday and Friday.   Yes [provider]  metoprolol succinate (TOPROL-XL) 25 MG 24 hr tablet Take 1 tablet  (25 mg total) by mouth in the morning and at bedtime. 01/02/22  Yes Bensimhon, Shaune Pascal, MD  Multiple Vitamins-Minerals (MULTIVITAMIN WOMEN PO) Take 1 tablet by mouth daily.   Yes [provider]  nitroGLYCERIN (NITROSTAT) 0.4 MG SL tablet Place 1 tablet (0.4 mg total) under the tongue every 5 (five) minutes x 3 doses as needed for chest pain. 01/08/14  Yes Charolette Forward, MD  potassium chloride SA (KLOR-CON M) 20 MEQ tablet Take 2 tablets (40 mEq total) by mouth daily. Take an additional 20 meq with every dose of METOLAZONE 12/19/21  Yes Bensimhon, Shaune Pascal, MD  RESTASIS 0.05 % ophthalmic emulsion Place 1 drop into both eyes 2 (two) times daily. 11/19/17  Yes [provider]  rosuvastatin (CRESTOR) 10 MG tablet Take 1 tablet (10 mg total) by mouth every evening. 09/02/21  Yes Aline August, MD  torsemide (DEMADEX) 20 MG tablet Take 2 tablets (40 mg total) by mouth 3 (three) times daily. 01/03/22  Yes Buford Dresser, MD  Alcohol Swabs (B-D SINGLE USE SWABS REGULAR) PADS 1 each by Other route daily. 07/06/21   [name], NP  Alcohol Swabs PADS 1 application by Does not apply route in the morning, at noon, and at bedtime. 09/14/21   [name], NP  amoxicillin-clavulanate (AUGMENTIN) 875-125 MG tablet Take 1 tablet by mouth 2 (two) times daily. Patient not taking: Reported on 01/17/2022 12/29/21   [name], NP  blood glucose meter kit and supplies KIT Dispense based on patient and insurance preference. Use up to four times daily as directed. 02/10/21   Patrecia Pour, MD  blood glucose meter kit and supplies Dispense based on patient and insurance preference. Use up to four times daily as directed. (FOR ICD-10 E10.9, E11.9). 07/06/21   [name], NP  Blood Glucose Monitoring Suppl (ACCU-CHEK GUIDE ME) w/Device KIT Use as instructed to check blood sugar three times daily. E11.69 04/21/20   Charlott Rakes, MD  glucose blood (ACCU-CHEK GUIDE) test strip  Use to check blood sugar TID. E11.69 07/13/21   Charlott Rakes, MD  ondansetron (ZOFRAN-ODT) 4 MG disintegrating tablet Take 1 tablet (4 mg total) by mouth every 8 (eight) hours as needed for nausea or vomiting. Patient not taking: Reported on 01/17/2022 10/17/21   Isla Pence, MD  pantoprazole (PROTONIX) 40 MG tablet Take 1 tablet (40 mg total) by mouth daily. 09/14/21   [name], NP      Allergies    Bee pollen and Sulfa antibiotics    Review of Systems   Review of Systems  Respiratory:  Positive for shortness of breath.   Cardiovascular:  Positive for chest pain and leg swelling.  All other systems reviewed and are negative.  Physical Exam Updated Vital Signs BP (!) 145/99   Pulse 86   Temp 98 F (36.7 C) (Oral)   Resp (!) 29   Ht $R'5\' 7"'xY$  (1.702 m)   Wt (!) 139.3 kg   SpO2 98%   BMI 48.08 kg/m  Physical Exam Vitals and nursing note reviewed.  Constitutional:  General: She is not in acute distress.    Appearance: She is well-developed.  HENT:     Head: Normocephalic and atraumatic.  Eyes:     Conjunctiva/sclera: Conjunctivae normal.  Neck:     Vascular: JVD present.  Cardiovascular:     Rate and Rhythm: Normal rate and regular rhythm.     Heart sounds: No murmur heard. Pulmonary:     Effort: Pulmonary effort is normal. No respiratory distress.     Breath sounds: Rales present.  Abdominal:     Palpations: Abdomen is soft.     Tenderness: There is no abdominal tenderness.  Musculoskeletal:        General: No swelling.     Cervical back: Neck supple.     Right lower leg: Edema present.     Left lower leg: Edema present.  Skin:    General: Skin is warm and dry.     Capillary Refill: Capillary refill takes less than 2 seconds.  Neurological:     Mental Status: She is alert.  Psychiatric:        Mood and Affect: Mood normal.    ED Results / Procedures / Treatments   Labs (all labs ordered are listed, but only abnormal results are  displayed) Labs Reviewed  COMPREHENSIVE METABOLIC PANEL - Abnormal; Notable for the following components:      Result Value   Potassium 3.3 (*)    Glucose, Bld 116 (*)    BUN 25 (*)    Creatinine, Ser 2.57 (*)    Albumin 3.4 (*)    Alkaline Phosphatase 132 (*)    GFR, Estimated 20 (*)    All other components within normal limits  BRAIN NATRIURETIC PEPTIDE - Abnormal; Notable for the following components:   B Natriuretic Peptide 292.6 (*)    All other components within normal limits  CBC - Abnormal; Notable for the following components:   RBC 3.27 (*)    Hemoglobin 8.7 (*)    HCT 29.2 (*)    MCHC 29.8 (*)    All other components within normal limits  SARS CORONAVIRUS 2 BY RT PCR  MAGNESIUM  TROPONIN I (HIGH SENSITIVITY)  TROPONIN I (HIGH SENSITIVITY)    EKG EKG Interpretation  Date/Time:  Tuesday Jan 17 2022 13:10:46 EDT Ventricular Rate:  84 PR Interval:    QRS Duration: 96 QT Interval:  466 QTC Calculation: 550 R Axis:   138 Text Interpretation: Normal sinus rhythm Prolonged PR interval PVCs Incomplete right bundle branch block Left posterior fascicular block Anterior infarct , age undetermined Prolonged QT Abnormal ECG When compared with ECG of 14-Dec-2021 12:15, PREVIOUS ECG IS PRESENT Reconfirmed by Regan Lemming ([id number]) on 01/17/2022 4:33:55 PM  Radiology DG Chest 2 View  Result Date: 01/17/2022 CLINICAL DATA:  Shortness of breath with lower extremity swelling and chest pain. History of congestive heart failure, asthma, diabetes and hypertension EXAM: CHEST - 2 VIEW COMPARISON:  CT 11/21/2021.  Radiographs 11/21/2021. FINDINGS: 1404 hours. Stable cardiomegaly post median sternotomy, mitral and tricuspid valve repair and left atrial appendage clipping. Stable mild vascular congestion without overt pulmonary edema. No pleural effusion or pneumothorax. The bones appear unchanged. IMPRESSION: Stable postoperative chest.  No acute cardiopulmonary process. Electronically Signed    By: Richardean Sale M.D.   On: 01/17/2022 14:22    Procedures Procedures    Medications Ordered in ED Medications  furosemide (LASIX) 200 mg in dextrose 5 % 100 mL (2 mg/mL) infusion (20 mg/hr Intravenous New Bag/Given 01/17/22  1701)  furosemide (LASIX) injection 80 mg (80 mg Intravenous Given 01/17/22 1656)  potassium chloride SA (KLOR-CON M) CR tablet 40 mEq (40 mEq Oral Given 01/17/22 1623)  potassium chloride SA (KLOR-CON M) CR tablet 40 mEq (40 mEq Oral Given 01/17/22 2226)    ED Course/ Medical Decision Making/ A&P                           Medical Decision Making Risk Decision regarding hospitalization.   66 year old female with medical history significant for recurrent atrial fibrillation and CHF previously underwent DCCV in April 2023, follows outpatient with Dr. Karena Addison of advanced heart failure and EP for her A-fib who presents to the emergency department from cardiology clinic earlier today with concern for CHF exacerbation and volume overload.  Per clinic notes, the patient was warm and significantly volume overloaded on exam with concern for significant fluid overload and need for admission to the hospital for IV diuresis.  The patient endorses continued and worsening bilateral lower extremity edema and worsening dyspnea on exertion and hypopnea.  She endorses some mild chest pressure and discomfort that has been ongoing for several days.  She has been compliant with her outpatient diuretic.  She denies any fevers or chills.  She denies any cough.  Chest pressure is located centrally in the chest with minimal radiation.  On arrival, the patient was afebrile, hypertensive BP 149/108, not tachycardic or tachypneic, saturating 97% on room air.  Patient presenting with concern for CHF exacerbation and volume overload and need for IV diuresis.  Work-up significant for an EKG with normal sinus rhythm, ventricular rate 84, prolonged PR interval, PVCs present, incomplete right bundle  branch block, prolonged QTc of 550, no ST segment changes to indicate coronary ischemia.  The patient was administered 80 mg of IV Lasix and started on a Lasix gtt. by cardiology after evaluation in triage.  Chest x-ray performed which revealed no clear evidence of pulmonary edema.  CBC revealed no leukocytosis, anemia to 8.7.  The patient denies any dark tarry stools or hematochezia.  CMP revealed hypokalemia 3.3, elevated BUN to 25, elevated creatinine of 2.57 which is within the patient's baseline of between 2.2 and 3.  She was advised Unna boots and a Lasix drip and admission to medicine by cardiology after evaluation in the ED.  Currently endorsing some chest pressure and persistent shortness of breath.  Hospitalist medicine, Dr. Bridgett Larsson consulted for admission for further evaluation and work-up plan for likely diuresis inpatient, echocardiogram inpatient per cardiology recommendations. Pt subsequently admitted in stable condition.   Final Clinical Impression(s) / ED Diagnoses Final diagnoses:  Shortness of breath  Heart failure, unspecified HF chronicity, unspecified heart failure type [facility])    Rx / DC Orders ED Discharge Orders     None         Regan Lemming, MD 01/18/22 0000    Regan Lemming, MD 01/18/22 0000

## 2022-01-18 ENCOUNTER — Encounter (HOSPITAL_COMMUNITY): Payer: Self-pay | Admitting: Internal Medicine

## 2022-01-18 DIAGNOSIS — I10 Essential (primary) hypertension: Secondary | ICD-10-CM | POA: Diagnosis not present

## 2022-01-18 DIAGNOSIS — E1169 Type 2 diabetes mellitus with other specified complication: Secondary | ICD-10-CM

## 2022-01-18 DIAGNOSIS — N179 Acute kidney failure, unspecified: Secondary | ICD-10-CM

## 2022-01-18 DIAGNOSIS — I50814 Right heart failure due to left heart failure: Secondary | ICD-10-CM | POA: Diagnosis not present

## 2022-01-18 DIAGNOSIS — N189 Chronic kidney disease, unspecified: Secondary | ICD-10-CM

## 2022-01-18 DIAGNOSIS — I480 Paroxysmal atrial fibrillation: Secondary | ICD-10-CM | POA: Diagnosis not present

## 2022-01-18 DIAGNOSIS — E785 Hyperlipidemia, unspecified: Secondary | ICD-10-CM

## 2022-01-18 LAB — BASIC METABOLIC PANEL
Anion gap: 8 (ref 5–15)
Anion gap: 5 (ref 5–15)
BUN: 26 mg/dL — ABNORMAL HIGH (ref 8–23)
BUN: 24 mg/dL — ABNORMAL HIGH (ref 8–23)
CO2: 25 mmol/L (ref 22–32)
CO2: 29 mmol/L (ref 22–32)
Calcium: 8.6 mg/dL — ABNORMAL LOW (ref 8.9–10.3)
Calcium: 9.1 mg/dL (ref 8.9–10.3)
Chloride: 104 mmol/L (ref 98–111)
Chloride: 106 mmol/L (ref 98–111)
Creatinine, Ser: 2.49 mg/dL — ABNORMAL HIGH (ref 0.44–1.00)
Creatinine, Ser: 2.61 mg/dL — ABNORMAL HIGH (ref 0.44–1.00)
GFR, Estimated: 21 mL/min — ABNORMAL LOW (ref >60)
GFR, Estimated: 20 mL/min — ABNORMAL LOW (ref >60)
Glucose, Bld: 161 mg/dL — ABNORMAL HIGH (ref 70–99)
Glucose, Bld: 144 mg/dL — ABNORMAL HIGH (ref 70–99)
Potassium: 3.1 mmol/L — ABNORMAL LOW (ref 3.5–5.1)
Potassium: 4.4 mmol/L (ref 3.5–5.1)
Sodium: 137 mmol/L (ref 135–145)
Sodium: 140 mmol/L (ref 135–145)

## 2022-01-18 LAB — GLUCOSE, CAPILLARY
Glucose-Capillary: 71 mg/dL (ref 70–99)
Glucose-Capillary: 131 mg/dL — ABNORMAL HIGH (ref 70–99)
Glucose-Capillary: 120 mg/dL — ABNORMAL HIGH (ref 70–99)

## 2022-01-18 LAB — CBG MONITORING, ED: Glucose-Capillary: 125 mg/dL — ABNORMAL HIGH (ref 70–99)

## 2022-01-18 LAB — MAGNESIUM: Magnesium: 1.8 mg/dL (ref 1.7–2.4)

## 2022-01-18 MED ORDER — PANTOPRAZOLE SODIUM 40 MG PO TBEC
40.0000 mg | DELAYED_RELEASE_TABLET | Freq: Every day | ORAL | Status: DC
  Administered 2022-01-18 – 2022-01-23 (×6): 40 mg via ORAL
  Filled 2022-01-18 (×6): qty 1

## 2022-01-18 MED ORDER — DULOXETINE HCL 60 MG PO CPEP
60.0000 mg | ORAL_CAPSULE | Freq: Every day | ORAL | Status: DC
  Administered 2022-01-18 – 2022-01-23 (×6): 60 mg via ORAL
  Filled 2022-01-18 (×7): qty 1

## 2022-01-18 MED ORDER — POTASSIUM CHLORIDE CRYS ER 20 MEQ PO TBCR
40.0000 meq | EXTENDED_RELEASE_TABLET | Freq: Once | ORAL | Status: AC
Start: 2022-01-18 — End: 2022-01-18
  Administered 2022-01-18: 40 meq via ORAL
  Filled 2022-01-18: qty 2

## 2022-01-18 MED ORDER — MAGNESIUM SULFATE 2 GM/50ML IV SOLN
2.0000 g | Freq: Once | INTRAVENOUS | Status: AC
Start: 2022-01-18 — End: 2022-01-18
  Administered 2022-01-18: 2 g via INTRAVENOUS
  Filled 2022-01-18: qty 50

## 2022-01-18 MED ORDER — INSULIN ASPART 100 UNIT/ML IJ SOLN
0.0000 [IU] | Freq: Three times a day (TID) | INTRAMUSCULAR | Status: DC
  Administered 2022-01-18 – 2022-01-19 (×5): 3 [IU] via SUBCUTANEOUS
  Administered 2022-01-20 – 2022-01-21 (×2): 4 [IU] via SUBCUTANEOUS
  Administered 2022-01-22: 3 [IU] via SUBCUTANEOUS
  Administered 2022-01-22: 4 [IU] via SUBCUTANEOUS
  Administered 2022-01-22: 3 [IU] via SUBCUTANEOUS
  Administered 2022-01-23: 4 [IU] via SUBCUTANEOUS

## 2022-01-18 MED ORDER — ACETAMINOPHEN 650 MG RE SUPP: 650.0000 mg | Freq: Four times a day (QID) | RECTAL | Status: DC | PRN

## 2022-01-18 MED ORDER — INSULIN ASPART 100 UNIT/ML IJ SOLN: 0.0000 [IU] | Freq: Every day | INTRAMUSCULAR | Status: DC

## 2022-01-18 MED ORDER — AMIODARONE HCL 200 MG PO TABS
200.0000 mg | ORAL_TABLET | Freq: Every day | ORAL | Status: DC
  Administered 2022-01-18 – 2022-01-23 (×6): 200 mg via ORAL
  Filled 2022-01-18 (×6): qty 1

## 2022-01-18 MED ORDER — BUSPIRONE HCL 5 MG PO TABS
5.0000 mg | ORAL_TABLET | Freq: Two times a day (BID) | ORAL | Status: DC
  Administered 2022-01-18 – 2022-01-23 (×11): 5 mg via ORAL
  Filled 2022-01-18 (×11): qty 1

## 2022-01-18 MED ORDER — APIXABAN 5 MG PO TABS
5.0000 mg | ORAL_TABLET | Freq: Two times a day (BID) | ORAL | Status: DC
  Administered 2022-01-18 – 2022-01-23 (×11): 5 mg via ORAL
  Filled 2022-01-18 (×11): qty 1

## 2022-01-18 MED ORDER — POTASSIUM CHLORIDE CRYS ER 20 MEQ PO TBCR
40.0000 meq | EXTENDED_RELEASE_TABLET | ORAL | Status: AC
  Administered 2022-01-18 (×2): 40 meq via ORAL
  Filled 2022-01-18 (×2): qty 2

## 2022-01-18 MED ORDER — ACETAMINOPHEN 325 MG PO TABS
650.0000 mg | ORAL_TABLET | Freq: Four times a day (QID) | ORAL | Status: DC | PRN
  Administered 2022-01-18 – 2022-01-22 (×6): 650 mg via ORAL
  Filled 2022-01-18 (×6): qty 2

## 2022-01-18 MED ORDER — ROSUVASTATIN CALCIUM 5 MG PO TABS
10.0000 mg | ORAL_TABLET | Freq: Every evening | ORAL | Status: DC
  Administered 2022-01-18 – 2022-01-22 (×5): 10 mg via ORAL
  Filled 2022-01-18 (×5): qty 2

## 2022-01-18 MED ORDER — METOPROLOL SUCCINATE ER 25 MG PO TB24
25.0000 mg | ORAL_TABLET | Freq: Two times a day (BID) | ORAL | Status: DC
  Administered 2022-01-18 – 2022-01-23 (×11): 25 mg via ORAL
  Filled 2022-01-18 (×11): qty 1

## 2022-01-18 MED ORDER — METOLAZONE 2.5 MG PO TABS
2.5000 mg | ORAL_TABLET | Freq: Once | ORAL | Status: AC
Start: 2022-01-18 — End: 2022-01-18
  Administered 2022-01-18: 2.5 mg via ORAL
  Filled 2022-01-18: qty 1

## 2022-01-18 NOTE — Assessment & Plan Note (Signed)
Hypokalemia, hypomagnesemia, CKD stage 4   Renal function with serum cr at 2,99, K is 4.2 and serum bicarbonate at 29.  Continue diuresis with torsemide and metolazone.  Follow up renal function in am.

## 2022-01-18 NOTE — Plan of Care (Signed)
  Problem: Activity: Goal: Capacity to carry out activities will improve Outcome: Progressing   Problem: Clinical Measurements: Goal: Respiratory complications will improve Outcome: Progressing Goal: Cardiovascular complication will be avoided Outcome: Progressing   Problem: Coping: Goal: Level of anxiety will decrease Outcome: Progressing   Problem: Elimination: Goal: Will not experience complications related to urinary retention Outcome: Progressing   Problem: Pain Managment: Goal: General experience of comfort will improve Outcome: Progressing   Problem: Safety: Goal: Ability to remain free from injury will improve Outcome: Progressing

## 2022-01-18 NOTE — Progress Notes (Addendum)
Advanced Heart Failure Rounding Note  PCP-Cardiologist: Buford Dresser, MD   Subjective:    On Lasix gtt at 20/hr.   2L in UOP yesterday. Weight not checked today   SCr 2.57>>2.49  K 3.1 Mg 1.8   CO2 25   Appears to be back in Afib this morning w/ CVR in the 80s.   Feels ok currently. No current resting dyspnea.  No complaints   Objective:   Weight Range: (!) 139.3 kg Body mass index is 48.08 kg/m.   Vital Signs:   Temp:  [98 F (36.7 C)-98.6 F (37 C)] 98 F (36.7 C) (05/30 1511) Pulse Rate:  [44-90] 84 (05/31 0730) Resp:  [14-29] 16 (05/31 0730) BP: (102-150)/(53-128) 133/74 (05/31 0730) SpO2:  [86 %-100 %] 100 % (05/31 0730) Weight:  [139.3 kg] 139.3 kg (05/30 1316)    Weight change: Filed Weights   01/17/22 1316  Weight: (!) 139.3 kg    Intake/Output:   Intake/Output Summary (Last 24 hours) at 01/18/2022 0800 Last data filed at 01/18/2022 0118 Gross per 24 hour  Intake --  Output 2100 ml  Net -2100 ml      Physical Exam    General:  Well appearing, obese. No resp difficulty HEENT: Normal Neck: Supple. Thick neck, JVP elevated 12 cm  Carotids 2+ bilat; no bruits. No lymphadenopathy or thyromegaly appreciated. Cor: PMI nondisplaced. Irregularly irregular rhythm. 3/6 TR murmur  Lungs: Clear Abdomen: Soft, nontender, nondistended. No hepatosplenomegaly. No bruits or masses. Good bowel sounds. Extremities: No cyanosis, clubbing, rash, obese LEs +edema + unna boots  Neuro: Alert & orientedx3, cranial nerves grossly intact. moves all 4 extremities w/o difficulty. Affect pleasant   Telemetry   Afib 80s   EKG    N/A   Labs    CBC Recent Labs    01/17/22 1350  WBC 4.6  HGB 8.7*  HCT 29.2*  MCV 89.3  PLT 086   Basic Metabolic Panel Recent Labs    01/17/22 1350 01/18/22 0354  NA 141 137  K 3.3* 3.1*  CL 106 104  CO2 27 25  GLUCOSE 116* 161*  BUN 25* 26*  CREATININE 2.57* 2.49*  CALCIUM 9.2 8.6*  MG 1.9 1.8    Liver Function Tests Recent Labs    01/17/22 1350  AST 23  ALT 15  ALKPHOS 132*  BILITOT 0.8  PROT 7.6  ALBUMIN 3.4*   No results for input(s): LIPASE, AMYLASE in the last 72 hours. Cardiac Enzymes No results for input(s): CKTOTAL, CKMB, CKMBINDEX, TROPONINI in the last 72 hours.  BNP: BNP (last 3 results) Recent Labs    12/14/21 1208 01/11/22 1134 01/17/22 1350  BNP 184.4* 304.3* 292.6*    ProBNP (last 3 results) No results for input(s): PROBNP in the last 8760 hours.   D-Dimer No results for input(s): DDIMER in the last 72 hours. Hemoglobin A1C No results for input(s): HGBA1C in the last 72 hours. Fasting Lipid Panel No results for input(s): CHOL, HDL, LDLCALC, TRIG, CHOLHDL, LDLDIRECT in the last 72 hours. Thyroid Function Tests No results for input(s): TSH, T4TOTAL, T3FREE, THYROIDAB in the last 72 hours.  Invalid input(s): FREET3  Other results:   Imaging    DG Chest 2 View  Result Date: 01/17/2022 CLINICAL DATA:  Shortness of breath with lower extremity swelling and chest pain. History of congestive heart failure, asthma, diabetes and hypertension EXAM: CHEST - 2 VIEW COMPARISON:  CT 11/21/2021.  Radiographs 11/21/2021. FINDINGS: 1404 hours. Stable cardiomegaly post median  sternotomy, mitral and tricuspid valve repair and left atrial appendage clipping. Stable mild vascular congestion without overt pulmonary edema. No pleural effusion or pneumothorax. The bones appear unchanged. IMPRESSION: Stable postoperative chest.  No acute cardiopulmonary process. Electronically Signed   By: Richardean Sale M.D.   On: 01/17/2022 14:22     Medications:     Scheduled Medications:  amiodarone  200 mg Oral Daily   apixaban  5 mg Oral BID   busPIRone  5 mg Oral BID   DULoxetine  60 mg Oral Daily   insulin aspart  0-20 Units Subcutaneous TID WC   insulin aspart  0-5 Units Subcutaneous QHS   metoprolol succinate  25 mg Oral BID   pantoprazole  40 mg Oral Daily    rosuvastatin  10 mg Oral QPM    Infusions:  furosemide (LASIX) 200 mg in dextrose 5% 100 mL ('2mg'$ /mL) infusion 20 mg/hr (01/18/22 0225)    PRN Medications: acetaminophen **OR** acetaminophen    Patient Profile   66 y.o. female with history of chronic biventricular systolic CHF, PAF/AFL, CKD IV, CAD, valvular heart disease, DMII, OSA. Multiple admissions for decompensated HF mostly in setting of recurrent AF.    Now presenting with a/c CHF.     Assessment/Plan   1. Acute on chronic Biventricular Systolic Heart Failure: - RHC (6/22) with low filling pressures with moderately reduced CO. - Echo (7/22) in setting of AFL with RVR, showed EF 30-35%, moderate LVH, moderate RV dysfunction, PASP 48, s/p MV repair with mild MR and mean gradient 10 mmHg with HR around 130, s/p TV repair with mild-moderate TR, dilated IVC.   - RHC 4/23 markedly elevated biventricular pressures - Echo 4/23 35-40% RV moderately down severe TR. Moderate MS mean grad 43m HG - Multiple admissions recently for HF mostly occur in setting of recurrent AF. Currently in NSR. - NYHA IIIb/IV. Volume overloaded. Diuresing w/ Lasix gtt but sluggish  - Continue Lasix gtt at 20/hr. Add 2.5 of metolazone and supp K  - May need addition of milrinone for RV support. No PICC with stage IV CKD. - Intolerant of AF. Need to maintain SR. Back in AF today, 80s.  - GDMT limited by AKI on CKD, eventually consider spiro and SGLT2i (vs hdl/ntg) - Continue toprol xl 25 mg bid - UNNA boots - Limit fluid intake - May need repeat RHC +/- TEE this admit   2. PAF/AFL - She has failed Tikosyn. - She had Maze in 5/22 and DCCV in 6/22 & 04/19/21, last of which she had ERAF. - seen by EP 10/22. Considering ablation vs PPM/AV nodal ablation if AF/AFL returns - Multiple admissions recently for HF mostly occur in setting of recurrent AF. Last DC-CV 11/24/21 - AFL at f/u 05/01, back in SR at f/u 05/16. In Afib today, rates 80s  - Continue  amiodarone + Eliquis  - Supp K and Mg  - Unable to tolerate AF at all. Follows with EP. Not a candidate for Afib ablation or AV nodal ablation/PPM - Has OSA, awaiting split night study for CPAP titration.    3. CKD Stage IV   - New baseline creatinine  ~ 2.6 - 2.8. 2.6 on admit - SCr 2.5 today. Follow w/ diuresis  - Consider SGLT2i if maintaining GFR> 20  - Follows with Nephrology - Avoid hypotension   4. CAD - Cath (2019): Mild nonobstructive CAD -  No s/s angina - Continue statin + beta blocker - no ASA w/ Eliquis  5. Valvular Heart Disease: - S/p MV and TV repair in 5/22.  - Echo 02/25/21 with mild-moderate TR.   - Echo 4/23 35-40% RV moderately down. severe TR. Moderate MS mean gradient 23mHG  - May need to repeat TEE and consider TV clip. Will d/w Structural Team    6. Obesity - Body mass index is 44.83 kg/m.   7. DMII - Hgb A1c 6.7% 04/23 - GFR has been too low for SGLT2i   8. OSA - initial sleep study positive but was awaiting split night study to titrate CPAP - Has been referred to Dr. TRadford Pax- suspect this is part of her AF recurrence  9. Hypokalemia/Hypomagnesemia - K 3.1, Mg 1.8  - supp lytes - follow closely w/ diuresis  - repeat BMP in PM       Length of Stay: 1  Brittainy Simmons, PA-C  01/18/2022, 8:00 AM  Advanced Heart Failure Team Pager [phone](M-F; 7a - 5p)  Please contact [facility] for night-coverage after hours (5p -7a ) and weekends on [email or web address]  Patient seen and examined with the above-signed Advanced Practice Provider and/or Housestaff. I personally reviewed laboratory data, imaging studies and relevant notes. I independently examined the patient and formulated the important aspects of the plan. I have edited the note to reflect any of my changes or salient points. I have personally discussed the plan with the patient and/or family.  Currently in SR with frequent PACs/PVCs. Modest diuresis on lasix gtt at 20/hr. Breathing better.  No orthopnea or PND.   General:  Sitting up in bed  No resp difficulty HEENT: normal Neck: supple. JVP to ear . Carotids 2+ bilat; no bruits. No lymphadenopathy or thryomegaly appreciated. Cor: PMI nondisplaced. Regular rate & rhythm. 3/6 TR Lungs: clear Abdomen: soft, nontender, nondistended. No hepatosplenomegaly. No bruits or masses. Good bowel sounds. Extremities: no cyanosis, clubbing, rash, 2+ edema  +UNNA Neuro: alert & orientedx3, cranial nerves grossly intact. moves all 4 extremities w/o difficulty. Affect pleasant  Continue IV diuresis. Supp electrolytes. Add metolazone. Will consider TEE once optimized with regard to potential TV intervention.   DGlori Bickers MD  2:00 PM

## 2022-01-18 NOTE — Progress Notes (Signed)
Heart Failure Navigator Progress Note  Assessed for Heart & Vascular TOC clinic readiness.  Patient does not meet criteria due to established patient with Advance Heart failure Team. .    [name], BSN, RN Heart Failure Nurse Navigator Secure Chat Only

## 2022-01-18 NOTE — Hospital Course (Signed)
Mrs. Cass was admitted to the hospital with the working diagnosis of decompensated heart failure.   66 yo female with past medical history of heart failure, severe tricuspid regurgitation, (mitral and tricuspid valve repair with Maze on 12/2020), obesity class 3 and T2DM who presented with dyspnea. Recent hospitalization in 11/2021 for heart failure exacerbation. Reported recurrent worsening dyspnea, weight gain and lower extremity edema over last 2 weeks.  As outpatient her diuretic therapy was adjusted, but unfortunately continue to worsen her symptoms, prompting Ed evaluation.  On her initial physical examination her blood pressure was 144/58, HR 85, RR 18 and 02 saturation 100%, lungs with no wheezing or rales, heart with S1 and S2 present with no gallops, positive systolic murmur at the right sternal border, abdomen protuberant and positive lower extremity edema.   Na 141, K 3.3 Cl 106, bicarbonate 116, bun 25 cr 2,57,  BNP 292 Wbc 4,6 hgb 8,7 plt 191   EKG 84 bpm, right axis, qtc 550, sinus rhythm with PAC with aberrancy, no ST segment changes, negative T lead II and AVL.   Patient was placed on furosemide drip for diuresis.   Improvement in volume status.   TEE to evaluate severe tricuspid regurgitation.

## 2022-01-18 NOTE — Progress Notes (Signed)
Progress Note   Patient: [name] [id number]:[id number] DOB: 24-Jul-1956 DOA: 01/17/2022     1 DOS: the patient was seen and examined on 01/18/2022   Brief hospital course: Mrs. Dye was admitted to the hospital with the working diagnosis of decompensated heart failure.   66 yo female with past medical history of heart failure, severe tricuspid regurgitation, (mitral and tricuspid valve repair with Maze on 12/2020), obesity class 3 and T2DM who presented with dyspnea. Recent hospitalization in 11/2021 for heart failure exacerbation. Reported recurrent worsening dyspnea, weight gain and lower extremity edema over last 2 weeks.  As outpatient her diuretic therapy was adjusted, but unfortunately continue to worsen her symptoms, prompting Ed evaluation.  On her initial physical examination her blood pressure was 144/58, HR 85, RR 18 and 02 saturation 100%, lungs with no wheezing or rales, heart with S1 and S2 present with no gallops, positive systolic murmur at the right sternal border, abdomen protuberant and positive lower extremity edema.   Na 141, K 3.3 Cl 106, bicarbonate 116, bun 25 cr 2,57,  BNP 292 Wbc 4,6 hgb 8,7 plt 191   EKG 84 bpm, right axis, qtc 550, sinus rhythm with PAC with aberrancy, no ST segment changes, negative T lead II and AVL.   Patient was placed on furosemide drip for diuresis.   Assessment and Plan: * Biventricular heart failure with reduced left ventricular function ([place]) April echocardiogram with reduced LV systolic function 35 to 63% with global hypokinesis, RV with moderate reduction in systolic function. RVSP 46,9 mmHg. Bilateral atrial dilatation. Mild mitral stenosis, severe tricuspid regurgitation.   Acute on chronic core pulmonale.   Urine output is 7,858 ml   Systolic blood pressure is 113 to 115 mmHg.   Plan to continue diuresis with IV furosemide.  Metolazone 2,5 mg today.  Continue with metoprolol.     Acute kidney injury superimposed on chronic  kidney disease (HCC) Hypokalemia, hypomagnesemia, CKD stage 4   Renal function with serum cr at 2,49, with K at 3,1 and serum bicarbonate at 25, Mg 1,8  Continue diuresis with furosemide.  Aggressive K correction with Kcl Continue Mg correction with Mg sulfate.  Follow up renal function and electrolytes in am.   Essential hypertension Continue diuresis with furosemide B blockade with metoprolol.   AF (paroxysmal atrial fibrillation) (HCC) Continue rate control with metoprolol and amiodarone. Continue anticoagulation with apixaban.   Type 2 diabetes mellitus with hyperlipidemia (HCC) Continue glucose cover and monitoring with insulin sliding scale.  Patient is tolerating po well.   Continue with rosuvastatin.   Class 3 obesity (HCC) Chronic.  BMI 48.2 OSA continue with Cpap.         Subjective: Patient with improvement of her symptoms but not back to baseline.   Physical Exam: Vitals:   01/18/22 0915 01/18/22 1000 01/18/22 1058 01/18/22 1105  BP: 115/74 (!) 119/95 113/73   Pulse: 86 85 86   Resp: '15 15 11 '$ (!) 21  Temp:  98 F (36.7 C) 98.5 F (36.9 C)   TempSrc:  Oral Oral   SpO2: 98% 100% 96%   Weight:   (!) 139.7 kg   Height:   '5\' 7"'$  (1.702 m)    Neurology awake and alert ENT with mild pallor Cardiovascular with S1 and S2 present, positive systolic murmur at the right sternal border. Positive JVD Positive lower extremity edema ++/+++ Abdomen not distended  Data Reviewed:    Family Communication: no family at the bedside   Disposition:  Status is: Inpatient Remains inpatient appropriate because: heart failure   Planned Discharge Destination: Home      Author: Tawni Millers, MD 01/18/2022 12:17 PM  For on call review [email or web address].

## 2022-01-19 DIAGNOSIS — I50814 Right heart failure due to left heart failure: Secondary | ICD-10-CM | POA: Diagnosis not present

## 2022-01-19 DIAGNOSIS — I10 Essential (primary) hypertension: Secondary | ICD-10-CM | POA: Diagnosis not present

## 2022-01-19 DIAGNOSIS — N179 Acute kidney failure, unspecified: Secondary | ICD-10-CM | POA: Diagnosis not present

## 2022-01-19 DIAGNOSIS — I480 Paroxysmal atrial fibrillation: Secondary | ICD-10-CM | POA: Diagnosis not present

## 2022-01-19 LAB — GLUCOSE, CAPILLARY
Glucose-Capillary: 136 mg/dL — ABNORMAL HIGH (ref 70–99)
Glucose-Capillary: 121 mg/dL — ABNORMAL HIGH (ref 70–99)
Glucose-Capillary: 135 mg/dL — ABNORMAL HIGH (ref 70–99)
Glucose-Capillary: 166 mg/dL — ABNORMAL HIGH (ref 70–99)

## 2022-01-19 LAB — BASIC METABOLIC PANEL
Anion gap: 7 (ref 5–15)
BUN: 30 mg/dL — ABNORMAL HIGH (ref 8–23)
CO2: 29 mmol/L (ref 22–32)
Calcium: 9.1 mg/dL (ref 8.9–10.3)
Chloride: 106 mmol/L (ref 98–111)
Creatinine, Ser: 2.88 mg/dL — ABNORMAL HIGH (ref 0.44–1.00)
GFR, Estimated: 18 mL/min — ABNORMAL LOW (ref >60)
Glucose, Bld: 136 mg/dL — ABNORMAL HIGH (ref 70–99)
Potassium: 4 mmol/L (ref 3.5–5.1)
Sodium: 142 mmol/L (ref 135–145)

## 2022-01-19 LAB — MAGNESIUM: Magnesium: 2.1 mg/dL (ref 1.7–2.4)

## 2022-01-19 MED ORDER — METOLAZONE 2.5 MG PO TABS
2.5000 mg | ORAL_TABLET | ORAL | Status: DC
  Administered 2022-01-20: 2.5 mg via ORAL
  Filled 2022-01-19: qty 1

## 2022-01-19 MED ORDER — TORSEMIDE 20 MG PO TABS
40.0000 mg | ORAL_TABLET | Freq: Three times a day (TID) | ORAL | Status: DC
  Administered 2022-01-19 – 2022-01-23 (×13): 40 mg via ORAL
  Filled 2022-01-19 (×14): qty 2

## 2022-01-19 MED ORDER — SODIUM CHLORIDE 0.9 % IV SOLN: INTRAVENOUS | Status: DC

## 2022-01-19 NOTE — Progress Notes (Addendum)
Advanced Heart Failure Rounding Note  PCP-Cardiologist: Buford Dresser, MD   Subjective:    2.6L in UOP yesterday w/ lasix gtt at 20/hr + 2.5 of metolazone.   Wt down 5 lb but still 8 lb above previous hospital d/c wt in April (294 lb)   SCr 2.57>>2.49>>2.61>>2.88. BUN 24>>30  K 4.0 Mg 2.1    Afib w/ PACs on tele.   Feeling a bit better but still SOB ambulating around room.     Objective:   Weight Range: (!) 137 kg Body mass index is 47.3 kg/m.   Vital Signs:   Temp:  [97.6 F (36.4 C)-98.5 F (36.9 C)] 97.6 F (36.4 C) (06/01 0550) Pulse Rate:  [84-88] 84 (06/01 0019) Resp:  [11-21] 18 (06/01 0550) BP: (112-154)/(64-95) 137/69 (06/01 0550) SpO2:  [96 %-100 %] 97 % (06/01 0550) Weight:  [137 kg-139.7 kg] 137 kg (06/01 0602) Last BM Date : 01/18/22  Weight change: Filed Weights   01/17/22 1316 01/18/22 1058 01/19/22 0602  Weight: (!) 139.3 kg (!) 139.7 kg (!) 137 kg    Intake/Output:   Intake/Output Summary (Last 24 hours) at 01/19/2022 0713 Last data filed at 01/19/2022 0552 Gross per 24 hour  Intake 947 ml  Output 2600 ml  Net -1653 ml      Physical Exam    General:  Well appearing, obese. No resp difficulty HEENT: Normal Neck: Supple. Thick neck JVD ~ 9 cm  Carotids 2+ bilat; no bruits. No lymphadenopathy or thyromegaly appreciated. Cor: PMI nondisplaced. Irregularly irregular rhythm. 3/6 TR murmur  Lungs: decreased BS at the bases bilaterally  Abdomen: obese, soft, nontender, nondistended. No hepatosplenomegaly. No bruits or masses. Good bowel sounds. Extremities: No cyanosis, clubbing, rash, obese LEs +edema + unna boots  Neuro: Alert & orientedx3, cranial nerves grossly intact. moves all 4 extremities w/o difficulty. Affect pleasant   Telemetry   NSR w/ PACs 80s, occasional PVCs   EKG    N/A   Labs    CBC Recent Labs    01/17/22 1350  WBC 4.6  HGB 8.7*  HCT 29.2*  MCV 89.3  PLT 025   Basic Metabolic Panel Recent  Labs    01/18/22 0354 01/18/22 1424 01/19/22 0354  NA 137 140 142  K 3.1* 4.4 4.0  CL 104 106 106  CO2 '25 29 29  '$ GLUCOSE 161* 144* 136*  BUN 26* 24* 30*  CREATININE 2.49* 2.61* 2.88*  CALCIUM 8.6* 9.1 9.1  MG 1.8  --  2.1   Liver Function Tests Recent Labs    01/17/22 1350  AST 23  ALT 15  ALKPHOS 132*  BILITOT 0.8  PROT 7.6  ALBUMIN 3.4*   No results for input(s): LIPASE, AMYLASE in the last 72 hours. Cardiac Enzymes No results for input(s): CKTOTAL, CKMB, CKMBINDEX, TROPONINI in the last 72 hours.  BNP: BNP (last 3 results) Recent Labs    12/14/21 1208 01/11/22 1134 01/17/22 1350  BNP 184.4* 304.3* 292.6*    ProBNP (last 3 results) No results for input(s): PROBNP in the last 8760 hours.   D-Dimer No results for input(s): DDIMER in the last 72 hours. Hemoglobin A1C No results for input(s): HGBA1C in the last 72 hours. Fasting Lipid Panel No results for input(s): CHOL, HDL, LDLCALC, TRIG, CHOLHDL, LDLDIRECT in the last 72 hours. Thyroid Function Tests No results for input(s): TSH, T4TOTAL, T3FREE, THYROIDAB in the last 72 hours.  Invalid input(s): FREET3  Other results:   Imaging  No results found.   Medications:     Scheduled Medications:  amiodarone  200 mg Oral Daily   apixaban  5 mg Oral BID   busPIRone  5 mg Oral BID   DULoxetine  60 mg Oral Daily   insulin aspart  0-20 Units Subcutaneous TID WC   insulin aspart  0-5 Units Subcutaneous QHS   metoprolol succinate  25 mg Oral BID   pantoprazole  40 mg Oral Daily   rosuvastatin  10 mg Oral QPM    Infusions:  furosemide (LASIX) 200 mg in dextrose 5% 100 mL ('2mg'$ /mL) infusion 20 mg/hr (01/19/22 0707)    PRN Medications: acetaminophen **OR** acetaminophen    Patient Profile   66 y.o. female with history of chronic biventricular systolic CHF, PAF/AFL, CKD IV, CAD, valvular heart disease, DMII, OSA. Multiple admissions for decompensated HF mostly in setting of recurrent AF.     Now presenting with a/c CHF.     Assessment/Plan   1. Acute on chronic Biventricular Systolic Heart Failure: - RHC (6/22) with low filling pressures with moderately reduced CO. - Echo (7/22) in setting of AFL with RVR, showed EF 30-35%, moderate LVH, moderate RV dysfunction, PASP 48, s/p MV repair with mild MR and mean gradient 10 mmHg with HR around 130, s/p TV repair with mild-moderate TR, dilated IVC.   - RHC 4/23 markedly elevated biventricular pressures - Echo 4/23 35-40% RV moderately down severe TR. Moderate MS mean grad 33m HG - Multiple admissions recently for HF mostly occur in setting of recurrent AF. Currently in NSR. - NYHA IIIb/IV. Wt down 5 lb w/ diuresis. SCr trending up. Volume status difficult due to body habitus but suspect she is still fluid overloaded. Remains 8 lb above previous d/c wt  - Continue lasix gtt at 20/hr  - Plan RHC to better assess volume status and hemodynamics  - May need addition of milrinone for RV support. No PICC with stage IV CKD. - Intolerant of AF. Need to maintain SR.  - GDMT limited by AKI on CKD, eventually consider spiro and SGLT2i (vs hdl/ntg) - Continue toprol xl 25 mg bid - UNNA boots - Limit fluid intake - TEE once better optimized to assess severity of TR    2. PAF/AFL - She has failed Tikosyn. - She had Maze in 5/22 and DCCV in 6/22 & 04/19/21, last of which she had ERAF. - seen by EP 10/22. Considering ablation vs PPM/AV nodal ablation if AF/AFL returns - Multiple admissions recently for HF mostly occur in setting of recurrent AF. Last DC-CV 11/24/21 - AFL at f/u 05/01, back in SR at f/u 05/16. SR w/ PACs today  - Continue amiodarone + Eliquis  - Supp K and Mg PRN   - Unable to tolerate AF at all. Follows with EP. Not a candidate for Afib ablation or AV nodal ablation/PPM - Has OSA, awaiting split night study for CPAP titration.    3. CKD Stage IV   - New baseline creatinine  ~ 2.6 - 2.8. 2.6 on admit - SCr trending up  2.49>>2.61>>2.88. Follow w/ diuresis  - Consider SGLT2i if maintaining GFR> 20  - Follows with Nephrology - Avoid hypotension   4. CAD - Cath (2019): Mild nonobstructive CAD -  No s/s angina - Continue statin + beta blocker - no ASA w/ Eliquis    5. Valvular Heart Disease: - S/p MV and TV repair in 5/22.  - Echo 02/25/21 with mild-moderate TR.   - Echo 4/23  35-40% RV moderately down. severe TR. Moderate MS mean gradient 52mHG  - TEE once HF optimized to evaluate for TV clip. Will d/w Structural Team    6. Obesity - Body mass index is 44.83 kg/m.   7. DMII - Hgb A1c 6.7% 04/23 - GFR has been too low for SGLT2i   8. OSA - initial sleep study positive but was awaiting split night study to titrate CPAP - Has been referred to Dr. TRadford Pax- suspect this is part of her AF recurrence  9. Hypokalemia/Hypomagnesemia - Resolved, K 4.0, Mg 2.1  - follow closely w/ diuresis     Length of Stay: 2  BLyda Jester PA-C  01/19/2022, 7:13 AM  Advanced Heart Failure Team Pager [phone](M-F; 7a - 5p)  Please contact [email or web address] for night-coverage after hours (5p -7a ) and weekends on [email or web address]  Patient seen and examined with the above-signed Advanced Practice Provider and/or Housestaff. I personally reviewed laboratory data, imaging studies and relevant notes. I independently examined the patient and formulated the important aspects of the plan. I have edited the note to reflect any of my changes or salient points. I have personally discussed the plan with the patient and/or family.  Feeling better but weight still up. Orthopnea resolved but still with some DOE. Scr climbing.   General:  Sitting up in bed  No resp difficulty HEENT: normal Neck: supple. JVP up  Carotids 2+ bilat; no bruits. No lymphadenopathy or thryomegaly appreciated. Cor: PMI nondisplaced. Regular rate & rhythm 3/6 TR Lungs: clear Abdomen: obese soft, nontender, nondistended. No hepatosplenomegaly. No bruits  or masses. Good bowel sounds. Extremities: no cyanosis, clubbing, rash, 1+ edema Neuro: alert & orientedx3, cranial nerves grossly intact. moves all 4 extremities w/o difficulty. Affect pleasant  Agree tat she likely still has volume on boar but renal function now limiting. Would hold IV lasix and switch back to po and see how she responds.  I have reviewed recent echo/TEE and RHC with Structural Team. PCWP is very high despite obvious MR and there is significant TR. Suspect end-stage and HD may be only want to control volume. Will arrange TTE to further evaluate options.   DGlori Bickers MD  8:40 AM

## 2022-01-19 NOTE — Progress Notes (Signed)
  Transition of Care [facility]) Note   Patient Details  Name: [name] Date of Birth: August 30, 1955   Transition of Care [facility]) CM/SW Contact:    Justis Closser, LCSW Phone Number: 01/19/2022, 9:34 AM    Transition of Care Department [facility]) has reviewed patient and we will continue to monitor patient advancement through interdisciplinary progression rounds. Patient will benefit from PT/OT consult for disposition recommendations.

## 2022-01-19 NOTE — Progress Notes (Signed)
Progress Note   Patient: [name] ACZ:[id number] DOB: 1956-07-25 DOA: 01/17/2022     2 DOS: the patient was seen and examined on 01/19/2022   Brief hospital course: [name] was admitted to the hospital with the working diagnosis of decompensated heart failure.   66 yo female with past medical history of heart failure, severe tricuspid regurgitation, (mitral and tricuspid valve repair with Maze on 12/2020), obesity class 3 and T2DM who presented with dyspnea. Recent hospitalization in 11/2021 for heart failure exacerbation. Reported recurrent worsening dyspnea, weight gain and lower extremity edema over last 2 weeks.  As outpatient her diuretic therapy was adjusted, but unfortunately continue to worsen her symptoms, prompting Ed evaluation.  On her initial physical examination her blood pressure was 144/58, HR 85, RR 18 and 02 saturation 100%, lungs with no wheezing or rales, heart with S1 and S2 present with no gallops, positive systolic murmur at the right sternal border, abdomen protuberant and positive lower extremity edema.   Na 141, K 3.3 Cl 106, bicarbonate 116, bun 25 cr 2,57,  BNP 292 Wbc 4,6 hgb 8,7 plt 191   EKG 84 bpm, right axis, qtc 550, sinus rhythm with PAC with aberrancy, no ST segment changes, negative T lead II and AVL.   Patient was placed on furosemide drip for diuresis.   Improvement in volume status.   Plan for TEE to evaluate severe tricuspid regurgitation.   Assessment and Plan: * Biventricular heart failure with reduced left ventricular function ([name]) April echocardiogram with reduced LV systolic function 35 to 10% with global hypokinesis, RV with moderate reduction in systolic function. RVSP 46,9 mmHg. Bilateral atrial dilatation. Mild mitral stenosis, severe tricuspid regurgitation.   Acute on chronic core pulmonale.   Urine output is 9,323 ml   Systolic blood pressure is 107 to 113 mmHg.   Transitioned to torsemide 40 mg po tid Metolazone 2.5 mg  three times per week.  Continue with metoprolol.  Plan for TEE,     Acute kidney injury superimposed on chronic kidney disease (HCC) Hypokalemia, hypomagnesemia, CKD stage 4   Improving volume status, renal function today with serum cr at 2,8 with K at 4,0 and serum bicarbonate at 29.  Mg 2.1  Plan to continue diuresis with torsemide tid and three times per week metolazone.  Follow up renal function and electrolytes in am.     Essential hypertension Blood pressure has been controlled, continue with metoprolol.  Limited medical options due to reduced GFR.  Continue diuretic therapy.   AF (paroxysmal atrial fibrillation) (HCC) Continue rate control with metoprolol and amiodarone. Continue anticoagulation with apixaban.   Type 2 diabetes mellitus with hyperlipidemia (HCC) Continue glucose cover and monitoring with insulin sliding scale.  Patient is tolerating po well.  Fasting glucose this am is 136 mg/dl.   Continue with rosuvastatin.   Class 3 obesity (HCC) Chronic.  BMI 48.2 OSA continue with Cpap.         Subjective: Patient with improvement of edema but not back to baseline. No chest pain   Physical Exam: Vitals:   01/19/22 0550 01/19/22 0602 01/19/22 0758 01/19/22 0817  BP: 137/69  117/68 113/72  Pulse:   81 84  Resp: '18  18 19  '$ Temp: 97.6 F (36.4 C)   97.6 F (36.4 C)  TempSrc: Oral   Oral  SpO2: 97%  97% 98%  Weight:  (!) 137 kg    Height:       Neurology awake and alert ENT  with no pallor Cardiovascular with S1 and S2 present and rhythmic with no gallops. Positive systolic murmur at the right sternal border Respiratory with no rales or wheezing Abdomen protuberant  Data Reviewed:    Family Communication: I spoke with patient's family at the bedside, we talked in detail about patient's condition, plan of care and prognosis and all questions were addressed.   Disposition: Status is: Inpatient Remains inpatient appropriate because: heart  failure   Planned Discharge Destination: Home    Author: Tawni Millers, MD 01/19/2022 1:51 PM  For on call review [email or web address].

## 2022-01-19 NOTE — Progress Notes (Signed)
Mobility Specialist Progress Note:   01/19/22 1500  Mobility  Activity Ambulated with assistance in hallway  Level of Assistance Standby assist, set-up cues, supervision of patient - no hands on  Assistive Device Front wheel walker  Distance Ambulated (ft) 200 ft  Activity Response Tolerated well  $Mobility charge 1 Mobility   Pt received in bed willing to participate in mobility. No complaints of pain. Required 1 standing rest break. Left EOB with call bell in reach and all needs met.   [facility] [name] Mobility Specialist

## 2022-01-19 NOTE — Progress Notes (Signed)
   Orders placed for TEE 01/20/22 to further assess Tricuspid Regurgitation.    NPO after MN.   Sashia Campas NP-C  3:57 PM

## 2022-01-20 ENCOUNTER — Encounter (HOSPITAL_COMMUNITY): Payer: Self-pay | Admitting: Internal Medicine

## 2022-01-20 ENCOUNTER — Inpatient Hospital Stay (HOSPITAL_COMMUNITY): Payer: 59 | Admitting: Anesthesiology

## 2022-01-20 ENCOUNTER — Encounter (HOSPITAL_COMMUNITY)
Admission: EM | Disposition: A | Discharge status: Home Health | Payer: Self-pay | Source: Home / Self Care | Attending: Internal Medicine

## 2022-01-20 ENCOUNTER — Inpatient Hospital Stay (HOSPITAL_COMMUNITY): Payer: 59

## 2022-01-20 DIAGNOSIS — I10 Essential (primary) hypertension: Secondary | ICD-10-CM | POA: Diagnosis not present

## 2022-01-20 DIAGNOSIS — I342 Nonrheumatic mitral (valve) stenosis: Secondary | ICD-10-CM

## 2022-01-20 DIAGNOSIS — I110 Hypertensive heart disease with heart failure: Secondary | ICD-10-CM

## 2022-01-20 DIAGNOSIS — I071 Rheumatic tricuspid insufficiency: Secondary | ICD-10-CM

## 2022-01-20 DIAGNOSIS — I361 Nonrheumatic tricuspid (valve) insufficiency: Secondary | ICD-10-CM

## 2022-01-20 DIAGNOSIS — I480 Paroxysmal atrial fibrillation: Secondary | ICD-10-CM | POA: Diagnosis not present

## 2022-01-20 DIAGNOSIS — N179 Acute kidney failure, unspecified: Secondary | ICD-10-CM | POA: Diagnosis not present

## 2022-01-20 DIAGNOSIS — I4891 Unspecified atrial fibrillation: Secondary | ICD-10-CM

## 2022-01-20 DIAGNOSIS — I50814 Right heart failure due to left heart failure: Secondary | ICD-10-CM | POA: Diagnosis not present

## 2022-01-20 DIAGNOSIS — I509 Heart failure, unspecified: Secondary | ICD-10-CM

## 2022-01-20 HISTORY — PX: TEE WITHOUT CARDIOVERSION: SHX5443

## 2022-01-20 LAB — BASIC METABOLIC PANEL
Anion gap: 9 (ref 5–15)
BUN: 36 mg/dL — ABNORMAL HIGH (ref 8–23)
CO2: 29 mmol/L (ref 22–32)
Calcium: 9.2 mg/dL (ref 8.9–10.3)
Chloride: 101 mmol/L (ref 98–111)
Creatinine, Ser: 2.99 mg/dL — ABNORMAL HIGH (ref 0.44–1.00)
GFR, Estimated: 17 mL/min — ABNORMAL LOW (ref >60)
Glucose, Bld: 110 mg/dL — ABNORMAL HIGH (ref 70–99)
Potassium: 4.2 mmol/L (ref 3.5–5.1)
Sodium: 139 mmol/L (ref 135–145)

## 2022-01-20 LAB — GLUCOSE, CAPILLARY
Glucose-Capillary: 115 mg/dL — ABNORMAL HIGH (ref 70–99)
Glucose-Capillary: 100 mg/dL — ABNORMAL HIGH (ref 70–99)
Glucose-Capillary: 152 mg/dL — ABNORMAL HIGH (ref 70–99)
Glucose-Capillary: 128 mg/dL — ABNORMAL HIGH (ref 70–99)

## 2022-01-20 LAB — MAGNESIUM: Magnesium: 1.9 mg/dL (ref 1.7–2.4)

## 2022-01-20 SURGERY — ECHOCARDIOGRAM, TRANSESOPHAGEAL
Anesthesia: Monitor Anesthesia Care

## 2022-01-20 MED ORDER — MAGNESIUM SULFATE 2 GM/50ML IV SOLN
2.0000 g | Freq: Once | INTRAVENOUS | Status: AC
  Administered 2022-01-20: 2 g via INTRAVENOUS
  Filled 2022-01-20: qty 50

## 2022-01-20 MED ORDER — PHENYLEPHRINE 80 MCG/ML (10ML) SYRINGE FOR IV PUSH (FOR BLOOD PRESSURE SUPPORT)
PREFILLED_SYRINGE | INTRAVENOUS | Status: DC | PRN
  Administered 2022-01-20 (×2): 160 ug via INTRAVENOUS
  Administered 2022-01-20: 80 ug via INTRAVENOUS

## 2022-01-20 MED ORDER — PROPOFOL 10 MG/ML IV BOLUS
INTRAVENOUS | Status: DC | PRN
  Administered 2022-01-20: 30 mg via INTRAVENOUS
  Administered 2022-01-20 (×2): 20 mg via INTRAVENOUS

## 2022-01-20 MED ORDER — PROPOFOL 500 MG/50ML IV EMUL
INTRAVENOUS | Status: DC | PRN
  Administered 2022-01-20: 75 ug/kg/min via INTRAVENOUS

## 2022-01-20 MED ORDER — LIDOCAINE HCL (CARDIAC) PF 100 MG/5ML IV SOSY
PREFILLED_SYRINGE | INTRAVENOUS | Status: DC | PRN
  Administered 2022-01-20: 40 mg via INTRATRACHEAL

## 2022-01-20 NOTE — H&P (View-Only) (Signed)
Advanced Heart Failure Rounding Note  PCP-Cardiologist: Buford Dresser, MD   Subjective:    6/1 IV diuresis held and switched to torsemide.   Negative 2.6 liters. Weight up 5 pounds.   SCr 2.57>>2.49>>2.61>>2.99. BUN 24>>30 >>36 K 4.2 Mg 1.9   Denies SOB. Denies chest pain.    Objective:   Weight Range: (!) 139.7 kg Body mass index is 48.24 kg/m.   Vital Signs:   Temp:  [97.8 F (36.6 C)-98.7 F (37.1 C)] 98 F (36.7 C) (06/02 0745) Pulse Rate:  [70-85] 81 (06/02 0745) Resp:  [15-25] 20 (06/02 0745) BP: (102-122)/(62-74) 105/74 (06/02 0745) SpO2:  [95 %] 95 % (06/01 2000) Weight:  [139.7 kg] 139.7 kg (06/02 0419) Last BM Date : 01/18/22  Weight change: Filed Weights   01/18/22 1058 01/19/22 0602 01/20/22 0419  Weight: (!) 139.7 kg (!) 137 kg (!) 139.7 kg    Intake/Output:   Intake/Output Summary (Last 24 hours) at 01/20/2022 0830 Last data filed at 01/20/2022 0421 Gross per 24 hour  Intake 720 ml  Output 2800 ml  Net -2080 ml      Physical Exam    General:   No resp difficulty HEENT: normal Neck: supple. JVP 10-11 . Carotids 2+ bilat; no bruits. No lymphadenopathy or thryomegaly appreciated. Cor: PMI nondisplaced. Regular rate & rhythm. No rubs, gallops . LLSB 2/6  murmurs. Lungs: clear Abdomen: soft, nontender, nondistended. No hepatosplenomegaly. No bruits or masses. Good bowel sounds. Extremities: no cyanosis, clubbing, rash, R and LLE  unna boots.  Neuro: alert & orientedx3, cranial nerves grossly intact. moves all 4 extremities w/o difficulty. Affect pleasant   Telemetry    SR with PACs/PVCs 70-80s   EKG    N/A   Labs    CBC Recent Labs    01/17/22 1350  WBC 4.6  HGB 8.7*  HCT 29.2*  MCV 89.3  PLT 762   Basic Metabolic Panel Recent Labs    01/19/22 0354 01/20/22 0451  NA 142 139  K 4.0 4.2  CL 106 101  CO2 29 29  GLUCOSE 136* 110*  BUN 30* 36*  CREATININE 2.88* 2.99*  CALCIUM 9.1 9.2  MG 2.1 1.9   Liver  Function Tests Recent Labs    01/17/22 1350  AST 23  ALT 15  ALKPHOS 132*  BILITOT 0.8  PROT 7.6  ALBUMIN 3.4*   No results for input(s): LIPASE, AMYLASE in the last 72 hours. Cardiac Enzymes No results for input(s): CKTOTAL, CKMB, CKMBINDEX, TROPONINI in the last 72 hours.  BNP: BNP (last 3 results) Recent Labs    12/14/21 1208 01/11/22 1134 01/17/22 1350  BNP 184.4* 304.3* 292.6*    ProBNP (last 3 results) No results for input(s): PROBNP in the last 8760 hours.   D-Dimer No results for input(s): DDIMER in the last 72 hours. Hemoglobin A1C No results for input(s): HGBA1C in the last 72 hours. Fasting Lipid Panel No results for input(s): CHOL, HDL, LDLCALC, TRIG, CHOLHDL, LDLDIRECT in the last 72 hours. Thyroid Function Tests No results for input(s): TSH, T4TOTAL, T3FREE, THYROIDAB in the last 72 hours.  Invalid input(s): FREET3  Other results:   Imaging    No results found.   Medications:     Scheduled Medications:  amiodarone  200 mg Oral Daily   apixaban  5 mg Oral BID   busPIRone  5 mg Oral BID   DULoxetine  60 mg Oral Daily   insulin aspart  0-20 Units Subcutaneous TID WC  insulin aspart  0-5 Units Subcutaneous QHS   metolazone  2.5 mg Oral Once per day on Mon Wed Fri   metoprolol succinate  25 mg Oral BID   pantoprazole  40 mg Oral Daily   rosuvastatin  10 mg Oral QPM   torsemide  40 mg Oral TID    Infusions:  sodium chloride 20 mL/hr at 01/20/22 0429    PRN Medications: acetaminophen **OR** acetaminophen    Patient Profile   66 y.o. female with history of chronic biventricular systolic CHF, PAF/AFL, CKD IV, CAD, valvular heart disease, DMII, OSA. Multiple admissions for decompensated HF mostly in setting of recurrent AF.    Now presenting with a/c CHF.     Assessment/Plan   1. Acute on chronic Biventricular Systolic Heart Failure: - RHC (6/22) with low filling pressures with moderately reduced CO. - Echo (7/22) in setting  of AFL with RVR, showed EF 30-35%, moderate LVH, moderate RV dysfunction, PASP 48, s/p MV repair with mild MR and mean gradient 10 mmHg with HR around 130, s/p TV repair with mild-moderate TR, dilated IVC.   - RHC 4/23 markedly elevated biventricular pressures - Echo 4/23 35-40% RV moderately down severe TR. Moderate MS mean grad 102m HG - Multiple admissions recently for HF mostly occur in setting of recurrent AF. Currently in NSR. - NYHA IIIb/IV.  -IV diuresis have been held due to worsening renal function. Switched to torsemide 40 mg tid.  - Continue torsemide for now but may need to stop tomorrow if renal function worsens.   - May need addition of milrinone for RV support. No PICC with stage IV CKD. - Intolerant of AF. Maintaining SR.   - GDMT limited by AKI on CKD, eventually consider spiro and SGLT2i (vs hdl/ntg) - Continue toprol xl 25 mg bid - UNNA boots - TEE today to further assess.    2. PAF/AFL - She has failed Tikosyn. - She had Maze in 5/22 and DCCV in 6/22 & 04/19/21, last of which she had ERAF. - seen by EP 10/22. Considering ablation vs PPM/AV nodal ablation if AF/AFL returns - Multiple admissions recently for HF mostly occur in setting of recurrent AF. Last DC-CV 11/24/21 - AFL at f/u 05/01, back in SR at f/u 05/16. SR w/ PACs today  - Continue amiodarone + Eliquis  - Mag 1.9. Supp Mag  - Unable to tolerate AF at all. Follows with EP. Not a candidate for Afib ablation or AV nodal ablation/PPM - Has OSA, awaiting split night study for CPAP titration.    3. CKD Stage IV   - New baseline creatinine  ~ 2.6 - 2.8. 2.6 on admit - SCr trending up 2.49>>2.61>>2.88>>2.99. As above will need to hold torsemide tomorrow if creatinine continues to rise.  - Consider SGLT2i if maintaining GFR> 20  - Follows with Nephrology - Avoid hypotension   4. CAD - Cath (2019): Mild nonobstructive CAD -  No chest pain.  - Continue statin + beta blocker - no ASA w/ Eliquis    5. Valvular  Heart Disease: - S/p MV and TV repair in 5/22.  - Echo 02/25/21 with mild-moderate TR.   - Echo 4/23 35-40% RV moderately down. severe TR. Moderate MS mean gradient 853mG  - TEE  today to further assess. NPO    6. Obesity - Body mass index is 48.24 kg/m. -Discussed portion control.    7. DMII - Hgb A1c 6.7% 04/23 - GFR has been too low for SGLT2i  8. OSA - initial sleep study positive but was awaiting split night study to titrate CPAP - Has been referred to Dr. Radford Pax - suspect this is part of her AF recurrence  9. Hypokalemia/Hypomagnesemia - Resolved, K 4.2, Mg 1.9  - follow closely w/ diuresis    Length of Stay: 3  Amy Clegg, NP  01/20/2022, 8:30 AM  Advanced Heart Failure Team Pager [phone] (M-F; 7a - 5p)  Please contact [facility] for night-coverage after hours (5p -7a ) and weekends on [email or web address]   Patient seen and examined with the above-signed Advanced Practice Provider and/or Housestaff. I personally reviewed laboratory data, imaging studies and relevant notes. I independently examined the patient and formulated the important aspects of the plan. I have edited the note to reflect any of my changes or salient points. I have personally discussed the plan with the patient and/or family.  Breathing ok. SCr continues to climb on po diuretics. Denies SOB, orthopnea or PND.   General:  Lying in bed No resp difficulty HEENT: normal Neck: supple. JVP hard to see Carotids 2+ bilat; no bruits. No lymphadenopathy or thryomegaly appreciated. Cor: PMI nondisplaced. Regular rate & rhythm. 3/6 TR Lungs: clear Abdomen: obese soft, nontender, nondistended. No hepatosplenomegaly. No bruits or masses. Good bowel sounds. Extremities: no cyanosis, clubbing, rash, edema Neuro: alert & orientedx3, cranial nerves grossly intact. moves all 4 extremities w/o difficulty. Affect pleasant  Volume status hard to assess on exam. Weight up 12 pounds from previous d/c but doesn;t seem  markedly overloaded on exam. Will check ReDS.   Plan TEE today to better assess TV/MV. (? Tricuspid VIV)  Reviewed data with Structural Team. We are concerned that she may have end-stage disease and that HD may be only way to manage volume status.   Glori Bickers, MD  9:20 AM

## 2022-01-20 NOTE — TOC Initial Note (Addendum)
Transition of Care [facility]) - Initial/Assessment Note    Patient Details  Name: [name] MRN: [id number] Date of Birth: Jan 01, 1956  Transition of Care [facility]) CM/SW Contact:    Erenest Rasher, RN Phone Number: [phone] 01/20/2022, 5:05 PM  Clinical Narrative:                  HF TOC CM spoke to pt at bedside. States she has a scale, and cane at home. States her boyfriend or sister will drive her to appts. Will continue to follow for dc needs.    Expected Discharge Plan: Home/Self Care Barriers to Discharge: Continued Medical Work up   Patient Goals and CMS Choice Patient states their goals for this hospitalization and ongoing recovery are:: wants to remain independent CMS Medicare.gov Compare Post Acute Care list provided to:: Patient    Expected Discharge Plan and Services Expected Discharge Plan: Home/Self Care   Discharge Planning Services: CM Consult   Living arrangements for the past 2 months: Apartment                                      Prior Living Arrangements/Services Living arrangements for the past 2 months: Apartment   Patient language and need for interpreter reviewed:: Yes Do you feel safe going back to the place where you live?: Yes      Need for Family Participation in Patient Care: No (Comment) Care giver support system in place?: No (comment) Current home services: DME (rollator, bedside commode, cane) Criminal Activity/Legal Involvement Pertinent to Current Situation/Hospitalization: No - Comment as needed  Activities of Daily Living Home Assistive Devices/Equipment: CBG Meter, Cane (specify quad or straight) ADL Screening (condition at time of admission) Patient's cognitive ability adequate to safely complete daily activities?: Yes Is the patient deaf or have difficulty hearing?: No Does the patient have difficulty seeing, even when wearing glasses/contacts?: No Does the patient have difficulty concentrating, remembering, or  making decisions?: No Patient able to express need for assistance with ADLs?: Yes Does the patient have difficulty dressing or bathing?: No Independently performs ADLs?: Yes (appropriate for developmental age) Does the patient have difficulty walking or climbing stairs?: No Weakness of Legs: Both Weakness of Arms/Hands: None  Permission Sought/Granted Permission sought to share information with : Family Supports, PCP, Case Manager Permission granted to share information with : Yes, Verbal Permission Granted  Share Information with NAME: Samiya     Permission granted to share info w Relationship: daughter  Permission granted to share info w Contact Information: [phone]  Emotional Assessment Appearance:: Appears stated age Attitude/Demeanor/Rapport: Engaged Affect (typically observed): Accepting Orientation: : Oriented to Self, Oriented to Place, Oriented to  Time, Oriented to Situation   Psych Involvement: No (comment)  Admission diagnosis:  Shortness of breath [R06.02] Heart failure (HCC) [I50.9] Biventricular heart failure with reduced left ventricular function (HCC) [I50.814] Heart failure, unspecified HF chronicity, unspecified heart failure type [facility]) [I50.9] Patient Active Problem List   Diagnosis Date Noted   Acute kidney injury superimposed on chronic kidney disease ([place]) 01/18/2022   Biventricular heart failure with reduced left ventricular function ([place]) 01/17/2022   Chronic deep vein thrombosis (DVT) of other vein of right upper extremity ([place]) 12/29/2021   Abnormal weight loss 11/16/2021   Personal history of colonic polyps 11/16/2021   Rib pain 11/16/2021   Anxiety and depression 08/26/2021   Essential hypertension 08/26/2021  GERD (gastroesophageal reflux disease) 08/26/2021   COPD (chronic obstructive pulmonary disease) ([place]) 07/29/2021   Prolonged QT interval 07/29/2021   Biventricular heart failure ([place])    Atrial flutter ([place])    S/P tricuspid valve  repair 02/22/2021   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   Encounter for preoperative dental examination    Teeth missing    Gingivitis    Accretions on teeth    Acute on chronic systolic CHF (congestive heart failure) ([place]) 12/21/2020   Atrial fibrillation, permanent ([place]) 11/02/2020   Acute on chronic combined systolic and diastolic CHF (congestive heart failure) ([place]) 05/06/2020   NICM (nonischemic cardiomyopathy) ([place]) 04/01/2020   Family history of heart disease 04/01/2020   Mitral regurgitation 04/01/2020   Acute on chronic left systolic heart failure ([place]) 07/01/2018   Congestive heart failure with left ventricular diastolic dysfunction, acute ([place]) 07/01/2018   Chronic atrial fibrillation ([place]) 24/40/1027   Diastolic dysfunction 25/36/6440   Diabetes mellitus type 2 in obese ([place]) 12/26/2017   Hypoxia 09/21/2011   Type 2 diabetes mellitus with hyperlipidemia ([place]) 02/04/2010   Class 3 obesity ([place]) 02/04/2010   AF (paroxysmal atrial fibrillation) ([place]) 02/04/2010   Gastroparesis 02/04/2010   PCP:  [name], NP Pharmacy:   [id number] [place]Phone: [phone]: [phone] [facility]Phone: [phone]: [phone] [facility] [address]: [phone]: [phone]    Social Determinants of Health (SDOH) Interventions Food Insecurity Interventions: Intervention Not Indicated Financial Strain Interventions: Intervention Not Indicated Housing Interventions: Intervention Not Indicated Transportation Interventions: Intervention Not Indicated  Readmission Risk Interventions    08/31/2021    1:49 PM 02/10/2021   10:17 AM 01/11/2021   12:21 PM  Readmission Risk Prevention Plan  Transportation Screening  Complete Complete Complete  Medication Review (RN Care Manager) Referral to Pharmacy Complete   PCP or Specialist appointment within 3-5 days of discharge Complete Complete Complete  HRI or Home Care Consult Complete Complete Complete  SW Recovery Care/Counseling Consult Complete Complete Complete  Palliative Care Screening Not Applicable Not Applicable Not AE[place]Not Applicable Not Applicable Not Applicable

## 2022-01-20 NOTE — Care Management Important Message (Signed)
Important Message  Patient Details  Name: LYAH MILLIRONS MRN: [id number] Date of Birth: [birth year]   Medicare Important Message Given:  Yes     Shelda Altes 01/20/2022, 8:00 AM

## 2022-01-20 NOTE — Anesthesia Preprocedure Evaluation (Addendum)
Anesthesia Evaluation  Patient identified by MRN, date of birth, ID band Patient awake    Reviewed: Allergy & Precautions, NPO status , Patient's Chart, lab work & pertinent test results  Airway Mallampati: II  TM Distance: >3 FB Neck ROM: Full    Dental  (+) Missing Several missing teeth:   Pulmonary asthma , sleep apnea and Continuous Positive Airway Pressure Ventilation , COPD,    Pulmonary exam normal breath sounds clear to auscultation       Cardiovascular hypertension, +CHF  + dysrhythmias Atrial Fibrillation  Rhythm:Irregular Rate:Normal  ECHO 11/2021 1. Left ventricular ejection fraction, by estimation, is 35 to 40%. The  left ventricle has moderately decreased function. The left ventricle  demonstrates global hypokinesis. Left ventricular diastolic function could  not be evaluated. There is the  interventricular septum is flattened in systole and diastole, consistent  with right ventricular pressure and volume overload.  2. Right ventricular systolic function is moderately reduced. The right  ventricular size is moderately enlarged. There is moderately elevated  pulmonary artery systolic pressure. The estimated right ventricular  systolic pressure is 87.5 mmHg.  3. Left atrial size was severely dilated.  4. Right atrial size was severely dilated.  5. The mitral valve is myxomatous. Trivial mitral valve regurgitation.  Mild mitral stenosis. The mean mitral valve gradient is 8.2 mmHg with  average heart rate of 100 bpm. There is a 30 mm CARBOMEDICS ANNULOFLEX  RING prosthetic annuloplasty ring present  in the mitral position. Procedure Date: 12/29/20.  6. Tricuspid annuloplasty repair. The tricuspid valve is has been  repaired/replaced. Tricuspid valve regurgitation is severe.  7. The aortic valve is tricuspid. Aortic valve regurgitation is not  visualized. No aortic stenosis is present.  8. The inferior vena  cava is dilated in size with >50% respiratory  variability, suggesting right atrial pressure of 8 mmHg.    Neuro/Psych  Headaches, PSYCHIATRIC DISORDERS Anxiety Depression    GI/Hepatic Neg liver ROS, GERD  ,  Endo/Other  diabetesMorbid obesity  Renal/GU Renal InsufficiencyRenal disease  negative genitourinary   Musculoskeletal  (+) Arthritis , Osteoarthritis,    Abdominal   Peds negative pediatric ROS (+)  Hematology  (+) Blood dyscrasia, anemia ,   Anesthesia Other Findings   Reproductive/Obstetrics negative OB ROS                           Anesthesia Physical Anesthesia Plan  ASA: 4  Anesthesia Plan: MAC   Post-op Pain Management:    Induction: Intravenous  PONV Risk Score and Plan: 2 and Propofol infusion, TIVA and Treatment may vary due to age or medical condition  Airway Management Planned: Natural Airway and Simple Face Mask  Additional Equipment: None  Intra-op Plan:   Post-operative Plan:   Informed Consent: I have reviewed the patients History and Physical, chart, labs and discussed the procedure including the risks, benefits and alternatives for the proposed anesthesia with the patient or authorized representative who has indicated his/her understanding and acceptance.       Plan Discussed with: CRNA, Anesthesiologist and Surgeon  Anesthesia Plan Comments: (Phenylephrine gtt available. Norton Blizzard, MD  )       Anesthesia Quick Evaluation

## 2022-01-20 NOTE — Progress Notes (Signed)
Progress Note   Patient: [name] SVX:[id number] DOB: [birth year] DOA: 01/17/2022     3 DOS: the patient was seen and examined on 01/20/2022   Brief hospital course: [name] was admitted to the hospital with the working diagnosis of decompensated heart failure.   66 yo female with past medical history of heart failure, severe tricuspid regurgitation, (mitral and tricuspid valve repair with Maze on 12/2020), obesity class 3 and T2DM who presented with dyspnea. Recent hospitalization in 11/2021 for heart failure exacerbation. Reported recurrent worsening dyspnea, weight gain and lower extremity edema over last 2 weeks.  As outpatient her diuretic therapy was adjusted, but unfortunately continue to worsen her symptoms, prompting Ed evaluation.  On her initial physical examination her blood pressure was 144/58, HR 85, RR 18 and 02 saturation 100%, lungs with no wheezing or rales, heart with S1 and S2 present with no gallops, positive systolic murmur at the right sternal border, abdomen protuberant and positive lower extremity edema.   Na 141, K 3.3 Cl 106, bicarbonate 116, bun 25 cr 2,57,  BNP 292 Wbc 4,6 hgb 8,7 plt 191   EKG 84 bpm, right axis, qtc 550, sinus rhythm with PAC with aberrancy, no ST segment changes, negative T lead II and AVL.   Patient was placed on furosemide drip for diuresis.   Improvement in volume status.   TEE to evaluate severe tricuspid regurgitation.   Assessment and Plan: * Biventricular heart failure with reduced left ventricular function ([place]) April echocardiogram with reduced LV systolic function 35 to 23% with global hypokinesis, RV with moderate reduction in systolic function. RVSP 46,9 mmHg. Bilateral atrial dilatation. Mild mitral stenosis, severe tricuspid regurgitation.   Acute on chronic core pulmonale.   Urine output is 3,007 ml   Systolic blood pressure is 102 to 103 mmHg.   Continue with torsemide 40 mg po tid Metolazone 2.5 mg three times  per week.  Continue with metoprolol.  Follow with TEE report on tricuspid valve evaluation.   Old records were reviewed, patient had mitral and tricuspid valve anuloplasty along with left and right sided maze clipping of the left atrial appendage in May 2022.  Post operative she had multiple hospitalizations for heart failure.     Acute kidney injury superimposed on chronic kidney disease (HCC) Hypokalemia, hypomagnesemia, CKD stage 4   Renal function with serum cr at 2,99, K is 4.2 and serum bicarbonate at 29.  Continue diuresis with torsemide and metolazone.  Follow up renal function in am.     Essential hypertension Blood pressure has been controlled, continue with metoprolol.  Limited medical options due to reduced GFR.  Continue diuretic therapy.   AF (paroxysmal atrial fibrillation) (HCC) Continue rate control with metoprolol and amiodarone. Continue anticoagulation with apixaban.   Type 2 diabetes mellitus with hyperlipidemia (HCC) Continue glucose cover and monitoring with insulin sliding scale.  Patient is tolerating po well.  Fasting glucose this am is 110 mg/dl.   Continue with rosuvastatin.   Class 3 obesity (HCC) Chronic.  BMI 48.2 OSA continue with Cpap.         Subjective: Patient continue to have lower extremity edema with moderate improvement, no dyspnea   Physical Exam: Vitals:   01/20/22 0745 01/20/22 0923 01/20/22 1059 01/20/22 1214  BP: 105/74 102/71 (!) 103/57 136/73  Pulse: 81 80 80 82  Resp: 20  18 (!) 22  Temp: 98 F (36.7 C)  98.5 F (36.9 C) (!) 97.3 F (36.3 C)  TempSrc: Oral  Oral Temporal  SpO2:   95% 98%  Weight:      Height:       Neurology awake and alert ENT with no pallor Cardiovascular with S1 and S2 present and rhythmic, no gallops, positive murmur at the right sternal border systolic Respiratory with no rales on anterior auscultation  Abdomen not distended Positive lower extremity edema ++++ unna boots in place.   Data Reviewed:    Family Communication: no family at the bedside   Disposition: Status is: Inpatient Remains inpatient appropriate because: heart failure   Planned Discharge Destination: Home    Author: Tawni Millers, MD 01/20/2022 12:55 PM  For on call review [email or web address].

## 2022-01-20 NOTE — Progress Notes (Addendum)
Advanced Heart Failure Rounding Note  PCP-Cardiologist: Buford Dresser, MD   Subjective:    6/1 IV diuresis held and switched to torsemide.   Negative 2.6 liters. Weight up 5 pounds.   SCr 2.57>>2.49>>2.61>>2.99. BUN 24>>30 >>36 K 4.2 Mg 1.9   Denies SOB. Denies chest pain.    Objective:   Weight Range: (!) 139.7 kg Body mass index is 48.24 kg/m.   Vital Signs:   Temp:  [97.8 F (36.6 C)-98.7 F (37.1 C)] 98 F (36.7 C) (06/02 0745) Pulse Rate:  [70-85] 81 (06/02 0745) Resp:  [15-25] 20 (06/02 0745) BP: (102-122)/(62-74) 105/74 (06/02 0745) SpO2:  [95 %] 95 % (06/01 2000) Weight:  [139.7 kg] 139.7 kg (06/02 0419) Last BM Date : 01/18/22  Weight change: Filed Weights   01/18/22 1058 01/19/22 0602 01/20/22 0419  Weight: (!) 139.7 kg (!) 137 kg (!) 139.7 kg    Intake/Output:   Intake/Output Summary (Last 24 hours) at 01/20/2022 0830 Last data filed at 01/20/2022 0421 Gross per 24 hour  Intake 720 ml  Output 2800 ml  Net -2080 ml      Physical Exam    General:   No resp difficulty HEENT: normal Neck: supple. JVP 10-11 . Carotids 2+ bilat; no bruits. No lymphadenopathy or thryomegaly appreciated. Cor: PMI nondisplaced. Regular rate & rhythm. No rubs, gallops . LLSB 2/6  murmurs. Lungs: clear Abdomen: soft, nontender, nondistended. No hepatosplenomegaly. No bruits or masses. Good bowel sounds. Extremities: no cyanosis, clubbing, rash, R and LLE  unna boots.  Neuro: alert & orientedx3, cranial nerves grossly intact. moves all 4 extremities w/o difficulty. Affect pleasant   Telemetry    SR with PACs/PVCs 70-80s   EKG    N/A   Labs    CBC Recent Labs    01/17/22 1350  WBC 4.6  HGB 8.7*  HCT 29.2*  MCV 89.3  PLT 497   Basic Metabolic Panel Recent Labs    01/19/22 0354 01/20/22 0451  NA 142 139  K 4.0 4.2  CL 106 101  CO2 29 29  GLUCOSE 136* 110*  BUN 30* 36*  CREATININE 2.88* 2.99*  CALCIUM 9.1 9.2  MG 2.1 1.9   Liver  Function Tests Recent Labs    01/17/22 1350  AST 23  ALT 15  ALKPHOS 132*  BILITOT 0.8  PROT 7.6  ALBUMIN 3.4*   No results for input(s): LIPASE, AMYLASE in the last 72 hours. Cardiac Enzymes No results for input(s): CKTOTAL, CKMB, CKMBINDEX, TROPONINI in the last 72 hours.  BNP: BNP (last 3 results) Recent Labs    12/14/21 1208 01/11/22 1134 01/17/22 1350  BNP 184.4* 304.3* 292.6*    ProBNP (last 3 results) No results for input(s): PROBNP in the last 8760 hours.   D-Dimer No results for input(s): DDIMER in the last 72 hours. Hemoglobin A1C No results for input(s): HGBA1C in the last 72 hours. Fasting Lipid Panel No results for input(s): CHOL, HDL, LDLCALC, TRIG, CHOLHDL, LDLDIRECT in the last 72 hours. Thyroid Function Tests No results for input(s): TSH, T4TOTAL, T3FREE, THYROIDAB in the last 72 hours.  Invalid input(s): FREET3  Other results:   Imaging    No results found.   Medications:     Scheduled Medications:  amiodarone  200 mg Oral Daily   apixaban  5 mg Oral BID   busPIRone  5 mg Oral BID   DULoxetine  60 mg Oral Daily   insulin aspart  0-20 Units Subcutaneous TID WC  insulin aspart  0-5 Units Subcutaneous QHS   metolazone  2.5 mg Oral Once per day on Mon Wed Fri   metoprolol succinate  25 mg Oral BID   pantoprazole  40 mg Oral Daily   rosuvastatin  10 mg Oral QPM   torsemide  40 mg Oral TID    Infusions:  sodium chloride 20 mL/hr at 01/20/22 0429    PRN Medications: acetaminophen **OR** acetaminophen    Patient Profile   66 y.o. female with history of chronic biventricular systolic CHF, PAF/AFL, CKD IV, CAD, valvular heart disease, DMII, OSA. Multiple admissions for decompensated HF mostly in setting of recurrent AF.    Now presenting with a/c CHF.     Assessment/Plan   1. Acute on chronic Biventricular Systolic Heart Failure: - RHC (6/22) with low filling pressures with moderately reduced CO. - Echo (7/22) in setting  of AFL with RVR, showed EF 30-35%, moderate LVH, moderate RV dysfunction, PASP 48, s/p MV repair with mild MR and mean gradient 10 mmHg with HR around 130, s/p TV repair with mild-moderate TR, dilated IVC.   - RHC 4/23 markedly elevated biventricular pressures - Echo 4/23 35-40% RV moderately down severe TR. Moderate MS mean grad 29m HG - Multiple admissions recently for HF mostly occur in setting of recurrent AF. Currently in NSR. - NYHA IIIb/IV.  -IV diuresis have been held due to worsening renal function. Switched to torsemide 40 mg tid.  - Continue torsemide for now but may need to stop tomorrow if renal function worsens.   - May need addition of milrinone for RV support. No PICC with stage IV CKD. - Intolerant of AF. Maintaining SR.   - GDMT limited by AKI on CKD, eventually consider spiro and SGLT2i (vs hdl/ntg) - Continue toprol xl 25 mg bid - UNNA boots - TEE today to further assess.    2. PAF/AFL - She has failed Tikosyn. - She had Maze in 5/22 and DCCV in 6/22 & 04/19/21, last of which she had ERAF. - seen by EP 10/22. Considering ablation vs PPM/AV nodal ablation if AF/AFL returns - Multiple admissions recently for HF mostly occur in setting of recurrent AF. Last DC-CV 11/24/21 - AFL at f/u 05/01, back in SR at f/u 05/16. SR w/ PACs today  - Continue amiodarone + Eliquis  - Mag 1.9. Supp Mag  - Unable to tolerate AF at all. Follows with EP. Not a candidate for Afib ablation or AV nodal ablation/PPM - Has OSA, awaiting split night study for CPAP titration.    3. CKD Stage IV   - New baseline creatinine  ~ 2.6 - 2.8. 2.6 on admit - SCr trending up 2.49>>2.61>>2.88>>2.99. As above will need to hold torsemide tomorrow if creatinine continues to rise.  - Consider SGLT2i if maintaining GFR> 20  - Follows with Nephrology - Avoid hypotension   4. CAD - Cath (2019): Mild nonobstructive CAD -  No chest pain.  - Continue statin + beta blocker - no ASA w/ Eliquis    5. Valvular  Heart Disease: - S/p MV and TV repair in 5/22.  - Echo 02/25/21 with mild-moderate TR.   - Echo 4/23 35-40% RV moderately down. severe TR. Moderate MS mean gradient 847mG  - TEE  today to further assess. NPO    6. Obesity - Body mass index is 48.24 kg/m. -Discussed portion control.    7. DMII - Hgb A1c 6.7% 04/23 - GFR has been too low for SGLT2i  8. OSA - initial sleep study positive but was awaiting split night study to titrate CPAP - Has been referred to Dr. Radford Pax - suspect this is part of her AF recurrence  9. Hypokalemia/Hypomagnesemia - Resolved, K 4.2, Mg 1.9  - follow closely w/ diuresis    Length of Stay: 3  Amy Clegg, NP  01/20/2022, 8:30 AM  Advanced Heart Failure Team Pager [phone] (M-F; 7a - 5p)  Please contact [facility] for night-coverage after hours (5p -7a ) and weekends on [email or web address]   Patient seen and examined with the above-signed Advanced Practice Provider and/or Housestaff. I personally reviewed laboratory data, imaging studies and relevant notes. I independently examined the patient and formulated the important aspects of the plan. I have edited the note to reflect any of my changes or salient points. I have personally discussed the plan with the patient and/or family.  Breathing ok. SCr continues to climb on po diuretics. Denies SOB, orthopnea or PND.   General:  Lying in bed No resp difficulty HEENT: normal Neck: supple. JVP hard to see Carotids 2+ bilat; no bruits. No lymphadenopathy or thryomegaly appreciated. Cor: PMI nondisplaced. Regular rate & rhythm. 3/6 TR Lungs: clear Abdomen: obese soft, nontender, nondistended. No hepatosplenomegaly. No bruits or masses. Good bowel sounds. Extremities: no cyanosis, clubbing, rash, edema Neuro: alert & orientedx3, cranial nerves grossly intact. moves all 4 extremities w/o difficulty. Affect pleasant  Volume status hard to assess on exam. Weight up 12 pounds from previous d/c but doesn;t seem  markedly overloaded on exam. Will check ReDS.   Plan TEE today to better assess TV/MV. (? Tricuspid VIV)  Reviewed data with Structural Team. We are concerned that she may have end-stage disease and that HD may be only way to manage volume status.   Glori Bickers, MD  9:20 AM  Addendum:  ReDS value 44% suggesting increased lung fluid but I think this is as good as we can get her.   Will switch back to po diuretics and if Scr stable in am would d/c home.  I would not start milrinone at this point as the risk of recurrent AF and further deterioration would be high.   Will discuss TEE results with Structural Team to see if she is candidate for TV VIV.   Glori Bickers, MD  1:58 PM

## 2022-01-20 NOTE — Anesthesia Postprocedure Evaluation (Signed)
Anesthesia Post Note  Patient: [name]  Procedure(s) Performed: TRANSESOPHAGEAL ECHOCARDIOGRAM (TEE)     Patient location during evaluation: Endoscopy Anesthesia Type: MAC Level of consciousness: awake and alert Pain management: pain level controlled Vital Signs Assessment: post-procedure vital signs reviewed and stable Respiratory status: spontaneous breathing, nonlabored ventilation and respiratory function stable Cardiovascular status: blood pressure returned to baseline and stable Postop Assessment: no apparent nausea or vomiting Anesthetic complications: no   No notable events documented.  Last Vitals:  Vitals:   01/20/22 1339 01/20/22 1401  BP: (!) 125/56 110/74  Pulse: (!) 47 73  Resp: (!) 22 20  Temp:    SpO2: 100% 100%    Last Pain:  Vitals:   01/20/22 1339  TempSrc:   PainSc: 0-No pain                 Lidia Collum

## 2022-01-20 NOTE — Transfer of Care (Signed)
Immediate Anesthesia Transfer of Care Note  Patient: [name]  Procedure(s) Performed: TRANSESOPHAGEAL ECHOCARDIOGRAM (TEE)  Patient Location: PACU and Endoscopy Unit  Anesthesia Type:MAC  Level of Consciousness: drowsy  Airway & Oxygen Therapy: Patient Spontanous Breathing  Post-op Assessment: Report given to RN and Post -op Vital signs reviewed and stable  Post vital signs: Reviewed and stable  Last Vitals:  Vitals Value Taken Time  BP 116/50 01/20/22 1319  Temp 36.2 C 01/20/22 1319  Pulse 66 01/20/22 1321  Resp 18 01/20/22 1321  SpO2 94 % 01/20/22 1321  Vitals shown include unvalidated device data.  Last Pain:  Vitals:   01/20/22 1319  TempSrc: Temporal  PainSc: 0-No pain      Patients Stated Pain Goal: 0 (15/83/09 4076)  Complications: No notable events documented.

## 2022-01-20 NOTE — Interval H&P Note (Signed)
History and Physical Interval Note:  01/20/2022 12:35 PM  [name]  has presented today for surgery, with the diagnosis of TRICUSPID REGURGITATION.  The various methods of treatment have been discussed with the patient and family. After consideration of risks, benefits and other options for treatment, the patient has consented to  Procedure(s): TRANSESOPHAGEAL ECHOCARDIOGRAM (TEE) (N/A) as a surgical intervention.  The patient's history has been reviewed, patient examined, no change in status, stable for surgery.  I have reviewed the patient's chart and labs.  Questions were answered to the patient's satisfaction.     Dravyn Severs

## 2022-01-21 DIAGNOSIS — F32A Depression, unspecified: Secondary | ICD-10-CM | POA: Diagnosis present

## 2022-01-21 LAB — BASIC METABOLIC PANEL
Anion gap: 9 (ref 5–15)
BUN: 43 mg/dL — ABNORMAL HIGH (ref 8–23)
CO2: 32 mmol/L (ref 22–32)
Calcium: 9 mg/dL (ref 8.9–10.3)
Chloride: 98 mmol/L (ref 98–111)
Creatinine, Ser: 3.17 mg/dL — ABNORMAL HIGH (ref 0.44–1.00)
GFR, Estimated: 16 mL/min — ABNORMAL LOW (ref >60)
Glucose, Bld: 137 mg/dL — ABNORMAL HIGH (ref 70–99)
Potassium: 3.4 mmol/L — ABNORMAL LOW (ref 3.5–5.1)
Sodium: 139 mmol/L (ref 135–145)

## 2022-01-21 LAB — GLUCOSE, CAPILLARY
Glucose-Capillary: 106 mg/dL — ABNORMAL HIGH (ref 70–99)
Glucose-Capillary: 164 mg/dL — ABNORMAL HIGH (ref 70–99)
Glucose-Capillary: 113 mg/dL — ABNORMAL HIGH (ref 70–99)
Glucose-Capillary: 128 mg/dL — ABNORMAL HIGH (ref 70–99)

## 2022-01-21 LAB — TROPONIN I (HIGH SENSITIVITY)
Troponin I (High Sensitivity): 5 ng/L (ref <18)
Troponin I (High Sensitivity): 6 ng/L (ref <18)

## 2022-01-21 LAB — C-REACTIVE PROTEIN: CRP: 0.9 mg/dL (ref <1.0)

## 2022-01-21 LAB — SEDIMENTATION RATE: Sed Rate: 94 mm/hr — ABNORMAL HIGH (ref 0–22)

## 2022-01-21 MED ORDER — POTASSIUM CHLORIDE CRYS ER 20 MEQ PO TBCR
40.0000 meq | EXTENDED_RELEASE_TABLET | ORAL | Status: AC
  Administered 2022-01-21 (×2): 40 meq via ORAL
  Filled 2022-01-21 (×2): qty 2

## 2022-01-21 MED ORDER — NITROGLYCERIN 0.4 MG SL SUBL
SUBLINGUAL_TABLET | SUBLINGUAL | Status: AC
  Administered 2022-01-21: 0.4 mg
  Filled 2022-01-21: qty 1

## 2022-01-21 NOTE — Progress Notes (Addendum)
Progress Note  Patient Name: [name] Date of Encounter: 01/21/2022  [facility] Cardiologist: Buford Dresser, MD   Subjective   Got called to see patient this morning for chest pain. She describes the pain as an elephant sitting on her chest that radiates to left shoulder. However, she also states it is worse when taking a deep breath and when laying down. Also reproducible with palpation of chest. She was given a dose of sublingual Nitro with no significant improvement. Systolic BP dropped to the 80s with this. She denies any shortness of breath. Appears comfortable.  Inpatient Medications    Scheduled Meds:  amiodarone  200 mg Oral Daily   apixaban  5 mg Oral BID   busPIRone  5 mg Oral BID   DULoxetine  60 mg Oral Daily   insulin aspart  0-20 Units Subcutaneous TID WC   insulin aspart  0-5 Units Subcutaneous QHS   metolazone  2.5 mg Oral Once per day on Mon Wed Fri   metoprolol succinate  25 mg Oral BID   pantoprazole  40 mg Oral Daily   rosuvastatin  10 mg Oral QPM   torsemide  40 mg Oral TID   Continuous Infusions:  PRN Meds: acetaminophen **OR** acetaminophen   Vital Signs    Vitals:   01/21/22 0916 01/21/22 0942 01/21/22 0945 01/21/22 1000  BP: 112/73 104/73 (!) 87/55 113/69  Pulse: 80 80 80   Resp: $Remo'20 18 19 'eFYGS$ (!) 21  Temp: 97.8 F (36.6 C)     TempSrc: Oral     SpO2:      Weight:      Height:        Intake/Output Summary (Last 24 hours) at 01/21/2022 1026 Last data filed at 01/21/2022 0900 Gross per 24 hour  Intake 1050 ml  Output 3600 ml  Net -2550 ml      01/21/2022    6:22 AM 01/20/2022    4:19 AM 01/19/2022    6:02 AM  Last 3 Weights  Weight (lbs) 304 lb 0.2 oz 307 lb 15.7 oz 302 lb  Weight (kg) 137.9 kg 139.7 kg 136.986 kg      Telemetry    Normal sinus rhythm with PVCs. - Personally Reviewed  ECG    Normal sinus rhythm, rate 81 bpm, with multiple PVCs and mild T wave versions in lead aVL. QTc 534 ms. - Personally  Reviewed  Physical Exam   GEN: Obese African-American female resting comfortably in no acute distress.   Neck: JVD elevated. Cardiac: RRR. II/VI systolic murmur best heard at left lower sternal border. No rubs or gallops. Respiratory: No increased work of breathing. Faint crackles noted in bases with occasional scattered wheeze in upper lung fields. GI: Soft, non-distended, and non-tender. MS: Bilateral lower extremity edema with Unna boots in place. No deformity. Skin: Warm and dry. Neuro:  No focal deficits. Psych: Normal affect. Responds appropriately.  Labs    High Sensitivity Troponin:   Recent Labs  Lab 01/17/22 1829 01/17/22 2045  TROPONINIHS 5 8     Chemistry Recent Labs  Lab 01/17/22 1350 01/18/22 0354 01/18/22 1424 01/19/22 0354 01/20/22 0451 01/21/22 0321  NA 141 137   < > 142 139 139  K 3.3* 3.1*   < > 4.0 4.2 3.4*  CL 106 104   < > 106 101 98  CO2 27 25   < > 29 29 32  GLUCOSE 116* 161*   < > 136* 110* 137*  BUN 25* 26*   < > 30* 36* 43*  CREATININE 2.57* 2.49*   < > 2.88* 2.99* 3.17*  CALCIUM 9.2 8.6*   < > 9.1 9.2 9.0  MG 1.9 1.8  --  2.1 1.9  --   PROT 7.6  --   --   --   --   --   ALBUMIN 3.4*  --   --   --   --   --   AST 23  --   --   --   --   --   ALT 15  --   --   --   --   --   ALKPHOS 132*  --   --   --   --   --   BILITOT 0.8  --   --   --   --   --   GFRNONAA 20* 21*   < > 18* 17* 16*  ANIONGAP 8 8   < > $R'7 9 9   'pR$ < > = values in this interval not displayed.    Lipids No results for input(s): CHOL, TRIG, HDL, LABVLDL, LDLCALC, CHOLHDL in the last 168 hours.  Hematology Recent Labs  Lab 01/17/22 1350  WBC 4.6  RBC 3.27*  HGB 8.7*  HCT 29.2*  MCV 89.3  MCH 26.6  MCHC 29.8*  RDW 14.6  PLT 191   Thyroid No results for input(s): TSH, FREET4 in the last 168 hours.  BNP Recent Labs  Lab 01/17/22 1350  BNP 292.6*    DDimer No results for input(s): DDIMER in the last 168 hours.   Radiology    ECHO TEE  Result Date:  01/20/2022    TRANSESOPHOGEAL ECHO REPORT   Patient Name:   MISCHELE DETTER Date of Exam: 01/20/2022 Medical Rec #:  [id number]      Height:       67.0 in Accession #:    [id number]     Weight:       308.0 lb Date of Birth:  1955-10-01      BSA:          2.430 m Patient Age:    66 years       BP:           122/58 mmHg Patient Gender: F              HR:           63 bpm. Exam Location:  Inpatient Procedure: Transesophageal Echo, Color Doppler, Cardiac Doppler and 3D Echo Indications:     Tricuspid regurgitation  History:         Patient has prior history of Echocardiogram examinations, most                  recent 11/22/2021. CHF, Mitral Valve Disease and TV Disease; Risk                  Factors:Sleep Apnea, Dyslipidemia, Diabetes and Hypertension.                  30 mm CARBOMEDICS ANNULOFLEX RING prosthetic annuloplasty ring                  present in the mitral position. Procedure Date: 12/29/20.                  Tricuspid annuloplasty repair. The tricuspid valve is has been  repaired/replaced.  Sonographer:     Eartha Inch Sonographer#2:   Clayton Lefort RDCS (AE) Referring Phys:  [id number] AMY D CLEGG Diagnosing Phys: Glori Bickers MD PROCEDURE: After discussion of the risks and benefits of a TEE, an informed consent was obtained from the patient. The transesophogeal probe was passed without difficulty through the esophogus of the patient. Sedation performed by different physician. The patient was monitored while under deep sedation. Anesthestetic sedation was provided intravenously by Anesthesiology: 300.51mg  of Propofol, 40mg  of Lidocaine. Image quality was good. The patient developed no complications during the procedure. IMPRESSIONS  1. Left ventricular ejection fraction, by estimation, is 55%. The left ventricle has severely decreased function. The left ventricle has no regional wall motion abnormalities.  2. Right ventricular systolic function is severely reduced. The right ventricular size is  severely enlarged.  3. Left atrial size was severely dilated. No left atrial/left atrial appendage thrombus was detected.  4. Right atrial size was severely dilated.  5. The mitral valve has been repaired/replaced. Trivial mitral valve regurgitation. Mild mitral stenosis. The mean mitral valve gradient is 3.0 mmHg.  6. The RA and RV are severely dilated. The tricuspid valve leaflets do no coapt. The tricuspid valve is has been repaired/replaced. The tricuspid valve is status post repair with an annuloplasty ring. Tricuspid valve regurgitation is severe.  7. The aortic valve is tricuspid. Aortic valve regurgitation is not visualized. FINDINGS  Left Ventricle: Left ventricular ejection fraction, by estimation, is 55%. The left ventricle has severely decreased function. The left ventricle has no regional wall motion abnormalities. The left ventricular internal cavity size was normal in size. Right Ventricle: The right ventricular size is severely enlarged. No increase in right ventricular wall thickness. Right ventricular systolic function is severely reduced. Left Atrium: Left atrial size was severely dilated. No left atrial/left atrial appendage thrombus was detected. Right Atrium: Right atrial size was severely dilated. Pericardium: There is no evidence of pericardial effusion. Mitral Valve: The mitral valve has been repaired/replaced. Trivial mitral valve regurgitation. There is a bioprosthetic valve present in the mitral position. Mild mitral valve stenosis. MV peak gradient, 14.1 mmHg. The mean mitral valve gradient is 3.0 mmHg. Tricuspid Valve: The RA and RV are severely dilated. The tricuspid valve leaflets do no coapt. The tricuspid valve is has been repaired/replaced. Tricuspid valve regurgitation is severe. No evidence of tricuspid stenosis. The tricuspid valve is status post repair with an annuloplasty ring. Aortic Valve: The aortic valve is tricuspid. Aortic valve regurgitation is not visualized. Pulmonic  Valve: The pulmonic valve was normal in structure. Pulmonic valve regurgitation is trivial. Aorta: The ascending aorta was not well visualized and the aortic root is normal in size and structure. IAS/Shunts: No atrial level shunt detected by color flow Doppler.  MITRAL VALVE            TRICUSPID VALVE MV Peak grad: 14.1 mmHg TV Peak grad:   3.5 mmHg MV Mean grad: 3.0 mmHg  TV Mean grad:   1.5 mmHg MV Vmax:      1.88 m/s  TV Vmax:        0.94 m/s MV Vmean:     73.6 cm/s TV Vmean:       64.4 cm/s                         TV VTI:         0.28 msec  TR Peak grad:   31.4 mmHg                         TR Vmax:        280.00 cm/s Glori Bickers MD Electronically signed by Glori Bickers MD Signature Date/Time: 01/20/2022/1:55:36 PM    Final     Cardiac Studies   Right Cardiac Catheterization 11/24/2021: Findings: RA = 29 RV = 65/30 PA = 67/37 (45) PCW = 30 (v waves to 45) Fick cardiac output/index = 6.2/2.5 PVR = 2.4 WU Ao sat = 93% PA sat = 50%, 55% PAPi = 1.0    Assessment: 1. Severely elevated biventricular pressures 2. Moderate pulmonary venous HTN with marked v-waves in PCWP tracing 3. Severe RV failure   Plan/Discussion: Add milrinone. Continue diuresis. _______________  TEE 01/20/2022: Impressions:  1. Left ventricular ejection fraction, by estimation, is 55%. The left  ventricle has severely decreased function. The left ventricle has no  regional wall motion abnormalities.   2. Right ventricular systolic function is severely reduced. The right  ventricular size is severely enlarged.   3. Left atrial size was severely dilated. No left atrial/left atrial  appendage thrombus was detected.   4. Right atrial size was severely dilated.   5. The mitral valve has been repaired/replaced. Trivial mitral valve  regurgitation. Mild mitral stenosis. The mean mitral valve gradient is 3.0  mmHg.   6. The RA and RV are severely dilated. The tricuspid valve leaflets do no   coapt. The tricuspid valve is has been repaired/replaced. The tricuspid  valve is status post repair with an annuloplasty ring. Tricuspid valve  regurgitation is severe.   7. The aortic valve is tricuspid. Aortic valve regurgitation is not  visualized.  Patient Profile     66 y.o. 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 She has had multiple admission for decompensated CHF mostly in the setting of recurrent atrial fibrillation. Readmitted with acute on chronic CHF on 01/17/2022.  Assessment & Plan    Chest Pain Non-Obstructive CAD History of non-obstructive CAD noted on cardiac catheterization in 2019. Started complaining of chest pain this morning. Describes the pain as an elephant sitting on her chest that radiates to left shoulder. However, she also states it is worse when taking a deep breath and when laying down. Also reproducible with palpation of chest.  - EKG shows no acute ischemic changes. - Will check high-sensitivity troponin. Suspect it will be mildly elevated given acute CHF and CKD but want to make sure it is not markedly elevated. - No pericardial effusion was noted on TEE yesterday but will check CRP and ESR. - This does not sounds like ischemic chest pain to me but will discuss with MD. If troponin markedly elevated, will need to discuss ischemic evaluation.  Acute on Chronic Biventricular Systolic CHF Patient has had multiple admissions for CHF mostly in the setting of recurrent atrial fibrillation. Most recent [place] in 11/2021 showed markedly elevated biventricular pressures. TTE in 11/2021 showed LVEF of 35-40%, moderately reduced RV systolic function, severe TR, and moderate mitral stenosis. Now admitted with  recurrent CHF. IV diuresis has been complicated byt worsening renal function. Patient underwent TEE on 01/20/2022 - report mentions LVEF of 55% but also states LV  has severely decreased function. RV severely enlarged with severely reduced systolic function. Also showed severe TR and mild MS. Net negative 9.3 L this admission. Weight only down 3 lbs since admission. Creatinine continues to rise. - She has chronic lower extremity edema. Per CHF note from yesterday, ReDS value was 44% suggesting increased lung fluid but it was felt that this was as good as we can get her. She has been switched back to PO Torsemide 40mg  thee times daily. Also on Metolazone 2.5mg  on M/W/F. - GDMT limited by renal function. No ACEi/ARB/ARNI or MRA. - Continue Toprol-XL 25mg  twice daily. - Of note, there is concern that she is approaching ESRD and that hemodialysis may be the only way to manage volume status.  Paroxysmal Atrial Fibrillation/Flutter S/p MAZE in 12/2020 and DCCV in 01/2021 and 03/2021. She has failed Tikosyn in the past. She does not tolerate atrial fibrillation at all with her CHF. - Maintaining sinus rhythm. - Continue Toprol-XL as above. - Continue Amiodarone 200mg  daily. - Continue Eliquis 5mg  twice daily.  Valvular Disease S/p MV and TV repair in 12/2020. Last TTE in 11/2021 showed severe TR and moderate MS. TEE on 01/20/2022 showed evere TR and mild MS. - Dr. [name] was going to review with structural team.  Hypertension BP well controlled. - Continue medications for CHF.  Hyperlipidemia - Continue Crestor 10mg  daily.  CKD Stage IV New baseline is around 2.6 to 2.8. Creatinine has been trending up the last several days: 2.49 >> 2.61 >> 2.88 >> 2.99 >>  3.17. - Follows with Nephrology. May need inpatient consult if renal function continues to worsen.  Hypokalemia Potassium 3.4 today. - Will give one dose of Kcl 40 mEq.  For questions or updates, please contact [place] Please consult  [email or web address] for contact info under   Signed, Darreld Mclean, PA-C  01/21/2022, 10:26 AM    History and all data above reviewed.  Patient examined.  I agree with the findings as above.  Non anginal chest pain with negative troponin.   EKG unchanged from previous with prolonged QTc.  PVCs   The patient exam reveals COR:RRR, no rub  ,  Lungs: Clear  ,  Abd: Positive bowel sounds, no rebound no guarding, Ext No edema  .  All available labs, radiology testing, previous records reviewed. Agree with documented assessment and plan. Chest pain:  Non anginal.  No change in therapy.  Acute on chronic right and left systolic HF:  Maximized the benefit of IV therapy.  Continue OP.  Need to follow creatinine.   Jeneen Rinks Krystyna Cleckley  12:02 PM  01/21/2022

## 2022-01-21 NOTE — Progress Notes (Signed)
Mobility Specialist Progress Note:   01/21/22 1200  Mobility  Activity Ambulated with assistance in hallway  Level of Assistance Standby assist, set-up cues, supervision of patient - no hands on  Assistive Device Front wheel walker  Distance Ambulated (ft) 200 ft  Activity Response Tolerated well  $Mobility charge 1 Mobility   Pt received in bed willing to participate in mobility. No complaints of pain. Left EOB with call bell in reach and all needs met.   [facility] Cederick Broadnax Mobility Specialist

## 2022-01-21 NOTE — Plan of Care (Signed)
  Problem: Clinical Measurements: Goal: Will remain free from infection Outcome: Completed/Met   Problem: Nutrition: Goal: Adequate nutrition will be maintained Outcome: Completed/Met   

## 2022-01-21 NOTE — Assessment & Plan Note (Signed)
Anxiety. Continue with buspirone and duloxetine.

## 2022-01-21 NOTE — Progress Notes (Signed)
Progress Note   Patient: [name]:[id number] DOB: 1955-11-08 DOA: 01/17/2022     4 DOS: the patient was seen and examined on 01/21/2022   Brief hospital course: [name] was admitted to the hospital with the working diagnosis of decompensated heart failure.   66 yo female with past medical history of heart failure, severe tricuspid regurgitation, (mitral and tricuspid valve repair with Maze on 12/2020), obesity class 3 and T2DM who presented with dyspnea. Recent hospitalization in 11/2021 for heart failure exacerbation. Reported recurrent worsening dyspnea, weight gain and lower extremity edema over last 2 weeks.  As outpatient her diuretic therapy was adjusted, but unfortunately continue to worsen her symptoms, prompting Ed evaluation.  On her initial physical examination her blood pressure was 144/58, HR 85, RR 18 and 02 saturation 100%, lungs with no wheezing or rales, heart with S1 and S2 present with no gallops, positive systolic murmur at the right sternal border, abdomen protuberant and positive lower extremity edema.   Na 141, K 3.3 Cl 106, bicarbonate 116, bun 25 cr 2,57,  BNP 292 Wbc 4,6 hgb 8,7 plt 191   EKG 84 bpm, right axis, qtc 550, sinus rhythm with PAC with aberrancy, no ST segment changes, negative T lead II and AVL.   Patient was placed on furosemide drip for diuresis.   Improvement in volume status.   TEE to evaluate severe tricuspid regurgitation on Monday.   Assessment and Plan: * Biventricular heart failure with reduced left ventricular function ([place]) April echocardiogram with reduced LV systolic function 35 to 48% with global hypokinesis, RV with moderate reduction in systolic function. RVSP 46,9 mmHg. Bilateral atrial dilatation. Mild mitral stenosis, severe tricuspid regurgitation.   Acute on chronic core pulmonale.   Urine output is 5,462 ml   Systolic blood pressure is 110 to 113 mmHg.   Continue with torsemide 40 mg po tid Metolazone 2.5 mg  three times per week.  Continue with metoprolol.  Follow with TEE report on tricuspid valve evaluation.   Old records were reviewed, patient had mitral and tricuspid valve anuloplasty along with left and right sided maze clipping of the left atrial appendage in May 2022.  Post operative she had multiple hospitalizations for heart failure.   Encourage to get out of bed tid with meals.  PT and OT.     Acute kidney injury superimposed on chronic kidney disease (HCC) Hypokalemia, hypomagnesemia, CKD stage 4   Patient has been diuresing well. Continue to have sings of volume overload.  Renal function today with serum cr at 3,17 with K at 3,4 and serum bicarbonate at 32. Plan to continue diuresis with torsemide and metolazone.  Follow up renal function in am Continue K correction with Kcl 40 meq x2     Essential hypertension Blood pressure has been controlled, continue with metoprolol.  Limited medical options due to reduced GFR.  Continue diuretic therapy.   AF (paroxysmal atrial fibrillation) (HCC) Continue rate control with metoprolol and amiodarone. Continue anticoagulation with apixaban.   Type 2 diabetes mellitus with hyperlipidemia (HCC) Continue glucose cover and monitoring with insulin sliding scale.  Patient is tolerating po well.  Fasting glucose this am is 137 mg/dl, consistent with hyperglycemia.   Continue with rosuvastatin.   Depression Anxiety. Continue with buspirone and duloxetine.   Class 3 obesity (HCC) Chronic.  BMI 48.2 OSA continue with Cpap.         Subjective: Patient had chest pain, pressure in nature this am radiated to her left  shoulder 9/10 in intensity. No dyspnea, edema has been improving, but not yet back to her baseline   Physical Exam: Vitals:   01/21/22 0942 01/21/22 0945 01/21/22 1000 01/21/22 1100  BP: 104/73 (!) 87/55 113/69 110/70  Pulse: 80 80    Resp: 18 19 (!) 21 18  Temp:      TempSrc:      SpO2:      Weight:       Height:        Neurology awake and alert ENT with no pallor Cardiovascular with S1 and S2 present and rhythmic with no gallops or murmurs Respiratory with no wheezing or rhonchi Abdomen with no distention  Positive lower extremity edema +++ unna boots in place.  Data Reviewed:    Family Communication: no family at the bedside   Disposition: Status is: Inpatient Remains inpatient appropriate because: pending TEE   Planned Discharge Destination: Home    Author: Tawni Millers, MD 01/21/2022 12:13 PM  For on call review [email or web address].

## 2022-01-22 ENCOUNTER — Other Ambulatory Visit (INDEPENDENT_AMBULATORY_CARE_PROVIDER_SITE_OTHER): Payer: Self-pay | Admitting: Primary Care

## 2022-01-22 DIAGNOSIS — Z760 Encounter for issue of repeat prescription: Secondary | ICD-10-CM

## 2022-01-22 LAB — BASIC METABOLIC PANEL
Anion gap: 10 (ref 5–15)
BUN: 50 mg/dL — ABNORMAL HIGH (ref 8–23)
CO2: 31 mmol/L (ref 22–32)
Calcium: 8.9 mg/dL (ref 8.9–10.3)
Chloride: 98 mmol/L (ref 98–111)
Creatinine, Ser: 3.12 mg/dL — ABNORMAL HIGH (ref 0.44–1.00)
GFR, Estimated: 16 mL/min — ABNORMAL LOW (ref >60)
Glucose, Bld: 157 mg/dL — ABNORMAL HIGH (ref 70–99)
Potassium: 3.6 mmol/L (ref 3.5–5.1)
Sodium: 139 mmol/L (ref 135–145)

## 2022-01-22 LAB — GLUCOSE, CAPILLARY
Glucose-Capillary: 129 mg/dL — ABNORMAL HIGH (ref 70–99)
Glucose-Capillary: 136 mg/dL — ABNORMAL HIGH (ref 70–99)
Glucose-Capillary: 175 mg/dL — ABNORMAL HIGH (ref 70–99)
Glucose-Capillary: 174 mg/dL — ABNORMAL HIGH (ref 70–99)

## 2022-01-22 MED ORDER — CALCIUM CARBONATE ANTACID 500 MG PO CHEW
1.0000 | CHEWABLE_TABLET | Freq: Two times a day (BID) | ORAL | Status: DC
  Administered 2022-01-22 – 2022-01-23 (×3): 200 mg via ORAL
  Filled 2022-01-22 (×3): qty 1

## 2022-01-22 NOTE — Progress Notes (Signed)
Progress Note  Patient: [name] TFT:[id number] DOB: 05/02/1956  DOA: 01/17/2022  DOS: 01/22/2022    Brief hospital course: [name] is a 66 y.o. female with a history of chronic combined HFrEF, severe TR s/p annuloplasty, AFib s/p MAZE May 2022, morbid obesity (BMI 47), and T2DM who presented to the ED 5/30 with shortness of breath and was readmitted with recurrent CHF exacerbation. [name] was admitted to the hospital with the working diagnosis of decompensated heart failure. Cardiology was consulted and lasix infusion started, ultimately weaned and started back on oral diuretics. Transesophageal echocardiogram confirmed severe TR and there is question of whether valve-in-valve surgery is indicated.   Assessment and Plan: Acute on chronic combined systolic and diastolic biventricular heart failure: LVEF 35-40% with global hypokinesis, moderately reduced RV systolic function with RVSP 46.9, BAE, mild MS, severe TR.  - Continue torsemide 40mg  po TID, metolazone 2.5mg  thrice weekly.  - Monitor daily weight, down 139kg >> 136.6kg. Good diuretic response over past 24 hours (3.275L yesterday, already 2.25L today). - Not currently on milrinone.  - Continue metoprolol succinate.  - GDMT limited by renal dysfunction.   Severe tricuspid insufficiency: With RV failure, peripheral volume overload. TEE confirmed severe TR.  - Diurese as above - Unna boots recommended - Cardiology to discuss with structural team ?TR-VIV.   PAF: Maintaining NSR on amiodarone. Failed tikosyn, s/p MAZE May 2022 and failed DCCV in June and Aug 2022 (ERAF), April 2023. - Intolerant of atrial fibrillation, exacerbates CHF. EP considering ablation vs. AV node ablation w/PPM if unable to maintain sinus rhythm. - Continue rhythm control strategy with amiodarone 200mg  po daily (last TSH 2.224), continue metoprolol, eliquis - Tx OSA    Chest pain: ACS ruled out w/normal troponins. Pericarditis unlikely with normal CRP.  ESR elevated with unclear etiology/significance in absence of fever, leukocytosis  AKI on stage IV CKD: Creatinine elevated from baseline but stabilized on oral diuretic regimen.  - Monitor metabolic panel daily, avoid NSAIDs and contrast.  - ?if she may require dialysis in the future for volume management with recurrent hospitalizations from CHF.  Hypokalemia:  - Resolved at this time, continue supplementation and monitoring.   Hypomagnesemia:  - Supplement as needed   HTN: Normotensive - Avoid hypotension.   T2DM: HbA1c 6.7% suggesting good chronic control.  - Continue SSI, at inpatient goal.  - Holding home dulaglutide  HLD:  - Continue rosuvastatin  Nonobstructive CAD: No ACS.  - Continue anticoagulation, BB, statin  Depression, anxiety: Quiescent.  - Continue home buspirone, duloxetine.   OSA:  - Continue CPAP qHS.   Anemia of CKD: Last Hb 8.7.  - Monitor CBC in AM.   Morbid obesity: Body mass index is 47.17 kg/m.  - Weight loss recommended. Pt is on dulaglutide, consider switch to semaglutide  Subjective: Having significant urine output and reports improved breathing and swelling but has not returned to her baseline. No chest pain at this time, but comes and goes.   Objective: Vitals:   01/21/22 1930 01/22/22 0330 01/22/22 0838 01/22/22 1109  BP: 110/65 114/64 (!) 119/91 115/71  Pulse: 79 78 79 74  Resp: 19 19 18 18   Temp: 97.9 F (36.6 C) 98.1 F (36.7 C)  97.6 F (36.4 C)  TempSrc: Oral Oral    SpO2: 96% 97%  95%  Weight:  (!) 136.6 kg    Height:       Gen: Pleasant obese 66 y.o. female in no distress Pulm: Nonlabored breathing room  air. Diminished at bases without wheezes or crackles CV: Regular rate and rhythm with occasional premature beats. Blowing systolic murmur at RSB, III/VI. No other murmur, rub, or gallop. Equivocal JVD, ++ dependent edema. GI: Abdomen soft, non-tender, non-distended, with normoactive bowel sounds.  Ext: Warm, no  deformities Skin: No rashes, lesions or ulcers on visualized skin. Neuro: Alert and oriented. No focal neurological deficits. Psych: Judgement and insight appear fair. Mood euthymic & affect congruent. Behavior is appropriate.    Data Personally reviewed: CBC: Recent Labs  Lab 01/17/22 1350  WBC 4.6  HGB 8.7*  HCT 29.2*  MCV 89.3  PLT 115   Basic Metabolic Panel: Recent Labs  Lab 01/17/22 1350 01/18/22 0354 01/18/22 1424 01/19/22 0354 01/20/22 0451 01/21/22 0321 01/22/22 0405  NA 141 137 140 142 139 139 139  K 3.3* 3.1* 4.4 4.0 4.2 3.4* 3.6  CL 106 104 106 106 101 98 98  CO2 $Re'27 25 29 29 29 'Vpb$ 32 31  GLUCOSE 116* 161* 144* 136* 110* 137* 157*  BUN 25* 26* 24* 30* 36* 43* 50*  CREATININE 2.57* 2.49* 2.61* 2.88* 2.99* 3.17* 3.12*  CALCIUM 9.2 8.6* 9.1 9.1 9.2 9.0 8.9  MG 1.9 1.8  --  2.1 1.9  --   --    GFR: Estimated Creatinine Clearance: 26 mL/min (A) (by C-G formula based on SCr of 3.12 mg/dL (H)). Liver Function Tests: Recent Labs  Lab 01/17/22 1350  AST 23  ALT 15  ALKPHOS 132*  BILITOT 0.8  PROT 7.6  ALBUMIN 3.4*   No results for input(s): LIPASE, AMYLASE in the last 168 hours. No results for input(s): AMMONIA in the last 168 hours. Coagulation Profile: No results for input(s): INR, PROTIME in the last 168 hours. Cardiac Enzymes: No results for input(s): CKTOTAL, CKMB, CKMBINDEX, TROPONINI in the last 168 hours. BNP (last 3 results) No results for input(s): PROBNP in the last 8760 hours. HbA1C: No results for input(s): HGBA1C in the last 72 hours. CBG: Recent Labs  Lab 01/21/22 1108 01/21/22 1655 01/21/22 2121 01/22/22 0546 01/22/22 1111  GLUCAP 164* 113* 128* 129* 136*   Lipid Profile: No results for input(s): CHOL, HDL, LDLCALC, TRIG, CHOLHDL, LDLDIRECT in the last 72 hours. Thyroid Function Tests: No results for input(s): TSH, T4TOTAL, FREET4, T3FREE, THYROIDAB in the last 72 hours. Anemia Panel: No results for input(s): VITAMINB12, FOLATE,  FERRITIN, TIBC, IRON, RETICCTPCT in the last 72 hours. Urine analysis:    Component Value Date/Time   COLORURINE YELLOW 11/21/2021 1926   APPEARANCEUR CLEAR 11/21/2021 1926   LABSPEC 1.008 11/21/2021 1926   PHURINE 5.0 11/21/2021 1926   GLUCOSEU NEGATIVE 11/21/2021 1926   HGBUR SMALL (A) 11/21/2021 1926   BILIRUBINUR NEGATIVE 11/21/2021 1926   BILIRUBINUR negative 09/24/2019 1742   KETONESUR NEGATIVE 11/21/2021 1926   PROTEINUR 30 (A) 11/21/2021 1926   UROBILINOGEN 1.0 09/24/2019 1742   UROBILINOGEN 1.0 10/26/2014 0249   NITRITE NEGATIVE 11/21/2021 1926   LEUKOCYTESUR NEGATIVE 11/21/2021 1926   Recent Results (from the past 240 hour(s))  SARS Coronavirus 2 by RT PCR (hospital order, performed in [facility] hospital lab) *cepheid single result test* Anterior Nasal Swab     Status: None   Collection Time: 01/17/22  6:18 PM   Specimen: Anterior Nasal Swab  Result Value Ref Range Status   SARS Coronavirus 2 by RT PCR NEGATIVE NEGATIVE Final    Comment: (NOTE) SARS-CoV-2 target nucleic acids are NOT DETECTED.  The SARS-CoV-2 RNA is generally detectable in upper and  lower respiratory specimens during the acute phase of infection. The lowest concentration of SARS-CoV-2 viral copies this assay can detect is 250 copies / mL. A negative result does not preclude SARS-CoV-2 infection and should not be used as the sole basis for treatment or other patient management decisions.  A negative result may occur with improper specimen collection / handling, submission of specimen other than nasopharyngeal swab, presence of viral mutation(s) within the areas targeted by this assay, and inadequate number of viral copies (<250 copies / mL). A negative result must be combined with clinical observations, patient history, and epidemiological information.  Fact Sheet for Patients:   https://www.patel.info/  Fact Sheet for Healthcare  Providers: https://hall.com/  This test is not yet approved or  cleared by the [place] FDA and has been authorized for detection and/or diagnosis of SARS-CoV-2 by FDA under an Emergency Use Authorization (EUA).  This EUA will remain in effect (meaning this test can be used) for the duration of the COVID-19 declaration under Section 564(b)(1) of the Act, 21 U.S.C. section 360bbb-3(b)(1), unless the authorization is terminated or revoked sooner.  Performed at [facility], [place] [address], [place], [place] [address]      ECHO TEE  Result Date: 01/20/2022    TRANSESOPHOGEAL ECHO REPORT   Patient Name:   EMORIE MCFATE Date of Exam: 01/20/2022 Medical Rec #:  [id number]      Height:       67.0 in Accession #:    [id number]     Weight:       308.0 lb Date of Birth:  1956-06-16      BSA:          2.430 m Patient Age:    5 years       BP:           122/58 mmHg Patient Gender: F              HR:           63 bpm. Exam Location:  Inpatient Procedure: Transesophageal Echo, Color Doppler, Cardiac Doppler and 3D Echo Indications:     Tricuspid regurgitation  History:         Patient has prior history of Echocardiogram examinations, most                  recent 11/22/2021. CHF, Mitral Valve Disease and TV Disease; Risk                  Factors:Sleep Apnea, Dyslipidemia, Diabetes and Hypertension.                  30 mm CARBOMEDICS ANNULOFLEX RING prosthetic annuloplasty ring                  present in the mitral position. Procedure Date: 12/29/20.                  Tricuspid annuloplasty repair. The tricuspid valve is has been                  repaired/replaced.  Sonographer:     Eartha Inch Sonographer#2:   Clayton Lefort RDCS (AE) Referring Phys:  [id number] AMY D CLEGG Diagnosing Phys: Glori Bickers MD PROCEDURE: After discussion of the risks and benefits of a TEE, an informed consent was obtained from the patient. The transesophogeal probe was passed without difficulty through the  esophogus of the patient. Sedation performed by different physician. The patient was  monitored while under deep sedation. Anesthestetic sedation was provided intravenously by Anesthesiology: 300.51mg  of Propofol, 40mg  of Lidocaine. Image quality was good. The patient developed no complications during the procedure. IMPRESSIONS  1. Left ventricular ejection fraction, by estimation, is 55%. The left ventricle has severely decreased function. The left ventricle has no regional wall motion abnormalities.  2. Right ventricular systolic function is severely reduced. The right ventricular size is severely enlarged.  3. Left atrial size was severely dilated. No left atrial/left atrial appendage thrombus was detected.  4. Right atrial size was severely dilated.  5. The mitral valve has been repaired/replaced. Trivial mitral valve regurgitation. Mild mitral stenosis. The mean mitral valve gradient is 3.0 mmHg.  6. The RA and RV are severely dilated. The tricuspid valve leaflets do no coapt. The tricuspid valve is has been repaired/replaced. The tricuspid valve is status post repair with an annuloplasty ring. Tricuspid valve regurgitation is severe.  7. The aortic valve is tricuspid. Aortic valve regurgitation is not visualized. FINDINGS  Left Ventricle: Left ventricular ejection fraction, by estimation, is 55%. The left ventricle has severely decreased function. The left ventricle has no regional wall motion abnormalities. The left ventricular internal cavity size was normal in size. Right Ventricle: The right ventricular size is severely enlarged. No increase in right ventricular wall thickness. Right ventricular systolic function is severely reduced. Left Atrium: Left atrial size was severely dilated. No left atrial/left atrial appendage thrombus was detected. Right Atrium: Right atrial size was severely dilated. Pericardium: There is no evidence of pericardial effusion. Mitral Valve: The mitral valve has been  repaired/replaced. Trivial mitral valve regurgitation. There is a bioprosthetic valve present in the mitral position. Mild mitral valve stenosis. MV peak gradient, 14.1 mmHg. The mean mitral valve gradient is 3.0 mmHg. Tricuspid Valve: The RA and RV are severely dilated. The tricuspid valve leaflets do no coapt. The tricuspid valve is has been repaired/replaced. Tricuspid valve regurgitation is severe. No evidence of tricuspid stenosis. The tricuspid valve is status post repair with an annuloplasty ring. Aortic Valve: The aortic valve is tricuspid. Aortic valve regurgitation is not visualized. Pulmonic Valve: The pulmonic valve was normal in structure. Pulmonic valve regurgitation is trivial. Aorta: The ascending aorta was not well visualized and the aortic root is normal in size and structure. IAS/Shunts: No atrial level shunt detected by color flow Doppler.  MITRAL VALVE            TRICUSPID VALVE MV Peak grad: 14.1 mmHg TV Peak grad:   3.5 mmHg MV Mean grad: 3.0 mmHg  TV Mean grad:   1.5 mmHg MV Vmax:      1.88 m/s  TV Vmax:        0.94 m/s MV Vmean:     73.6 cm/s TV Vmean:       64.4 cm/s                         TV VTI:         0.28 msec                         TR Peak grad:   31.4 mmHg                         TR Vmax:        280.00 cm/s Glori Bickers MD Electronically signed by Glori Bickers MD Signature Date/Time: 01/20/2022/1:55:36 PM  Final      Family Communication: None at bedside  Disposition: Status is: Inpatient Remains inpatient appropriate because: Ongoing diuresis, managing AKI on CKD and biventricular heart failure. Planned Discharge Destination: Home with Cap[place], MD 01/22/2022 12:24 PM Page by [email or web address]

## 2022-01-22 NOTE — Progress Notes (Signed)
Progress Note  Patient Name: [name] Date of Encounter: 01/22/2022  Primary Cardiologist:   Buford Dresser, MD   Subjective   She denies any acute SOB.  She feels like she is not breathing back to baseline however  Inpatient Medications    Scheduled Meds:  amiodarone  200 mg Oral Daily   apixaban  5 mg Oral BID   busPIRone  5 mg Oral BID   calcium carbonate  1 tablet Oral BID   DULoxetine  60 mg Oral Daily   insulin aspart  0-20 Units Subcutaneous TID WC   insulin aspart  0-5 Units Subcutaneous QHS   metolazone  2.5 mg Oral Once per day on Mon Wed Fri   metoprolol succinate  25 mg Oral BID   pantoprazole  40 mg Oral Daily   rosuvastatin  10 mg Oral QPM   torsemide  40 mg Oral TID   Continuous Infusions:  PRN Meds: acetaminophen **OR** acetaminophen   Vital Signs    Vitals:   01/21/22 1930 01/22/22 0330 01/22/22 0838 01/22/22 1109  BP: 110/65 114/64 (!) 119/91 115/71  Pulse: 79 78 79 74  Resp: '19 19 18 18  '$ Temp: 97.9 F (36.6 C) 98.1 F (36.7 C)  97.6 F (36.4 C)  TempSrc: Oral Oral    SpO2: 96% 97%  95%  Weight:  (!) 136.6 kg    Height:        Intake/Output Summary (Last 24 hours) at 01/22/2022 1157 Last data filed at 01/22/2022 1115 Gross per 24 hour  Intake 940 ml  Output 4225 ml  Net -3285 ml   Filed Weights   01/20/22 0419 01/21/22 0622 01/22/22 0330  Weight: (!) 139.7 kg (!) 137.9 kg (!) 136.6 kg    Telemetry    NSR, PVCs - Personally Reviewed  ECG    NA - Personally Reviewed  Physical Exam   GEN: No acute distress.   Neck:    Positive JVD Cardiac: RRR, 3/6 holosystolic left sternal murmur, no diastolic murmurs, rubs, or gallops.  Respiratory:      Bilateral basilar crackles GI: Soft, nontender, non-distended  MS:    Moderate leg edema; No deformity. Neuro:  Nonfocal  Psych: Normal affect   Labs    Chemistry Recent Labs  Lab 01/17/22 1350 01/18/22 0354 01/20/22 0451 01/21/22 0321 01/22/22 0405  NA 141   < >  139 139 139  K 3.3*   < > 4.2 3.4* 3.6  CL 106   < > 101 98 98  CO2 27   < > 29 32 31  GLUCOSE 116*   < > 110* 137* 157*  BUN 25*   < > 36* 43* 50*  CREATININE 2.57*   < > 2.99* 3.17* 3.12*  CALCIUM 9.2   < > 9.2 9.0 8.9  PROT 7.6  --   --   --   --   ALBUMIN 3.4*  --   --   --   --   AST 23  --   --   --   --   ALT 15  --   --   --   --   ALKPHOS 132*  --   --   --   --   BILITOT 0.8  --   --   --   --   GFRNONAA 20*   < > 17* 16* 16*  ANIONGAP 8   < > '9 9 10   '$ < > =  values in this interval not displayed.     Hematology Recent Labs  Lab 01/17/22 1350  WBC 4.6  RBC 3.27*  HGB 8.7*  HCT 29.2*  MCV 89.3  MCH 26.6  MCHC 29.8*  RDW 14.6  PLT 191    Cardiac EnzymesNo results for input(s): TROPONINI in the last 168 hours. No results for input(s): TROPIPOC in the last 168 hours.   BNP Recent Labs  Lab 01/17/22 1350  BNP 292.6*     DDimer No results for input(s): DDIMER in the last 168 hours.   Radiology    ECHO TEE  Result Date: 01/20/2022    TRANSESOPHOGEAL ECHO REPORT   Patient Name:   KESHAUNA DEGRAFFENREID Date of Exam: 01/20/2022 Medical Rec #:  [id number]      Height:       67.0 in Accession #:    [id number]     Weight:       308.0 lb Date of Birth:  11/29/1955      BSA:          2.430 m Patient Age:    89 years       BP:           122/58 mmHg Patient Gender: F              HR:           63 bpm. Exam Location:  Inpatient Procedure: Transesophageal Echo, Color Doppler, Cardiac Doppler and 3D Echo Indications:     Tricuspid regurgitation  History:         Patient has prior history of Echocardiogram examinations, most                  recent 11/22/2021. CHF, Mitral Valve Disease and TV Disease; Risk                  Factors:Sleep Apnea, Dyslipidemia, Diabetes and Hypertension.                  30 mm CARBOMEDICS ANNULOFLEX RING prosthetic annuloplasty ring                  present in the mitral position. Procedure Date: 12/29/20.                  Tricuspid annuloplasty repair. The  tricuspid valve is has been                  repaired/replaced.  Sonographer:     Eartha Inch Sonographer#2:   Clayton Lefort RDCS (AE) Referring Phys:  [id number] AMY D CLEGG Diagnosing Phys: Glori Bickers MD PROCEDURE: After discussion of the risks and benefits of a TEE, an informed consent was obtained from the patient. The transesophogeal probe was passed without difficulty through the esophogus of the patient. Sedation performed by different physician. The patient was monitored while under deep sedation. Anesthestetic sedation was provided intravenously by Anesthesiology: 300.'51mg'$  of Propofol, '40mg'$  of Lidocaine. Image quality was good. The patient developed no complications during the procedure. IMPRESSIONS  1. Left ventricular ejection fraction, by estimation, is 55%. The left ventricle has severely decreased function. The left ventricle has no regional wall motion abnormalities.  2. Right ventricular systolic function is severely reduced. The right ventricular size is severely enlarged.  3. Left atrial size was severely dilated. No left atrial/left atrial appendage thrombus was detected.  4. Right atrial size was severely dilated.  5. The mitral valve has been repaired/replaced. Trivial mitral valve  regurgitation. Mild mitral stenosis. The mean mitral valve gradient is 3.0 mmHg.  6. The RA and RV are severely dilated. The tricuspid valve leaflets do no coapt. The tricuspid valve is has been repaired/replaced. The tricuspid valve is status post repair with an annuloplasty ring. Tricuspid valve regurgitation is severe.  7. The aortic valve is tricuspid. Aortic valve regurgitation is not visualized. FINDINGS  Left Ventricle: Left ventricular ejection fraction, by estimation, is 55%. The left ventricle has severely decreased function. The left ventricle has no regional wall motion abnormalities. The left ventricular internal cavity size was normal in size. Right Ventricle: The right ventricular size is severely  enlarged. No increase in right ventricular wall thickness. Right ventricular systolic function is severely reduced. Left Atrium: Left atrial size was severely dilated. No left atrial/left atrial appendage thrombus was detected. Right Atrium: Right atrial size was severely dilated. Pericardium: There is no evidence of pericardial effusion. Mitral Valve: The mitral valve has been repaired/replaced. Trivial mitral valve regurgitation. There is a bioprosthetic valve present in the mitral position. Mild mitral valve stenosis. MV peak gradient, 14.1 mmHg. The mean mitral valve gradient is 3.0 mmHg. Tricuspid Valve: The RA and RV are severely dilated. The tricuspid valve leaflets do no coapt. The tricuspid valve is has been repaired/replaced. Tricuspid valve regurgitation is severe. No evidence of tricuspid stenosis. The tricuspid valve is status post repair with an annuloplasty ring. Aortic Valve: The aortic valve is tricuspid. Aortic valve regurgitation is not visualized. Pulmonic Valve: The pulmonic valve was normal in structure. Pulmonic valve regurgitation is trivial. Aorta: The ascending aorta was not well visualized and the aortic root is normal in size and structure. IAS/Shunts: No atrial level shunt detected by color flow Doppler.  MITRAL VALVE            TRICUSPID VALVE MV Peak grad: 14.1 mmHg TV Peak grad:   3.5 mmHg MV Mean grad: 3.0 mmHg  TV Mean grad:   1.5 mmHg MV Vmax:      1.88 m/s  TV Vmax:        0.94 m/s MV Vmean:     73.6 cm/s TV Vmean:       64.4 cm/s                         TV VTI:         0.28 msec                         TR Peak grad:   31.4 mmHg                         TR Vmax:        280.00 cm/s Glori Bickers MD Electronically signed by Glori Bickers MD Signature Date/Time: 01/20/2022/1:55:36 PM    Final      Patient Profile     66 y.o. female with a history of non-obstructive CAD noted on cath in 2019, chronic biventricular systolic CHF paroxysmal atrial fibrillation/flutter, valvular  heart disease s/p MV and TV repair in 12/2020, hypertension, hyperlipidemia, type 2 diabetes mellitus, CKD stage IV, obstructive sleep apnea, and obesity. She has had multiple admission for decompensated CHF mostly in the setting of recurrent atrial fibrillation. Readmitted with acute on chronic CHF on 01/17/2022.  Assessment & Plan    CHEST PAIN:  None reported this morning.  Pleuritic type pain yesterday but she is not reporting  this now CRP was normal.  Sed rate was increased.  I do not suspect pericarditis.  No further work up.    Net negative 12 liters with 2.3 liters out yesterday.  Tolerating current diuresis.    ACUTE ON CHRONIC SYSTOLIC AND DIASTOLIC RIGHT AND LEFT HEART FAILURE:    PAF:    Maintaining NSR on beta blocker and amio.  Continues Eliquis.    MV/TV repair with moderate severe TR and moderate MS:  Dr. [name] will discuss with structural team.      HTN:  This is being managed in the context of treating his CHF   CKD IV:  Creat is holding steady today at 3.12.     For questions or updates, please contact [place] Please consult [email or web address] for contact info under Cardiology/STEMI.   Signed, Minus Breeding, MD  01/22/2022, 11:57 AM

## 2022-01-22 NOTE — Progress Notes (Signed)
Mobility Specialist Progress Note:   01/22/22 1517  Mobility  Activity  (Bed level exercises)  Level of Assistance Independent after set-up  Activity Response Tolerated well  $Mobility charge 1 Mobility   Pt received in bed. Pt declined ambulation but agreed to bed level exercises. Completed 4 sets of leg exercises. Left in bed with call bell in reach and all needs met.   [facility] Chakia Counts Mobility Specialist

## 2022-01-23 ENCOUNTER — Encounter (HOSPITAL_COMMUNITY): Payer: Self-pay | Admitting: Internal Medicine

## 2022-01-23 DIAGNOSIS — F32A Depression, unspecified: Secondary | ICD-10-CM

## 2022-01-23 LAB — GLUCOSE, CAPILLARY
Glucose-Capillary: 108 mg/dL — ABNORMAL HIGH (ref 70–99)
Glucose-Capillary: 166 mg/dL — ABNORMAL HIGH (ref 70–99)

## 2022-01-23 LAB — BASIC METABOLIC PANEL
Anion gap: 7 (ref 5–15)
BUN: 51 mg/dL — ABNORMAL HIGH (ref 8–23)
CO2: 33 mmol/L — ABNORMAL HIGH (ref 22–32)
Calcium: 9.1 mg/dL (ref 8.9–10.3)
Chloride: 97 mmol/L — ABNORMAL LOW (ref 98–111)
Creatinine, Ser: 3.12 mg/dL — ABNORMAL HIGH (ref 0.44–1.00)
GFR, Estimated: 16 mL/min — ABNORMAL LOW (ref >60)
Glucose, Bld: 133 mg/dL — ABNORMAL HIGH (ref 70–99)
Potassium: 3.1 mmol/L — ABNORMAL LOW (ref 3.5–5.1)
Sodium: 137 mmol/L (ref 135–145)

## 2022-01-23 LAB — CBC
HCT: 27.2 % — ABNORMAL LOW (ref 36.0–46.0)
Hemoglobin: 8.7 g/dL — ABNORMAL LOW (ref 12.0–15.0)
MCH: 27.4 pg (ref 26.0–34.0)
MCHC: 32 g/dL (ref 30.0–36.0)
MCV: 85.5 fL (ref 80.0–100.0)
Platelets: 197 10*3/uL (ref 150–400)
RBC: 3.18 MIL/uL — ABNORMAL LOW (ref 3.87–5.11)
RDW: 14.5 % (ref 11.5–15.5)
WBC: 5.2 10*3/uL (ref 4.0–10.5)
nRBC: 0 % (ref 0.0–0.2)

## 2022-01-23 LAB — MAGNESIUM: Magnesium: 2 mg/dL (ref 1.7–2.4)

## 2022-01-23 MED ORDER — TORSEMIDE 20 MG PO TABS: 60.0000 mg | ORAL_TABLET | Freq: Two times a day (BID) | ORAL | Status: DC

## 2022-01-23 MED ORDER — TORSEMIDE 20 MG PO TABS: 60.0000 mg | ORAL_TABLET | Freq: Two times a day (BID) | ORAL | 0 refills | Status: DC

## 2022-01-23 MED ORDER — POTASSIUM CHLORIDE CRYS ER 20 MEQ PO TBCR: 20.0000 meq | EXTENDED_RELEASE_TABLET | Freq: Once | ORAL | Status: DC

## 2022-01-23 MED ORDER — POTASSIUM CHLORIDE CRYS ER 20 MEQ PO TBCR
40.0000 meq | EXTENDED_RELEASE_TABLET | Freq: Once | ORAL | Status: AC
Start: 2022-01-23 — End: 2022-01-23
  Administered 2022-01-23: 40 meq via ORAL
  Filled 2022-01-23: qty 2

## 2022-01-23 MED ORDER — TORSEMIDE 20 MG PO TABS
20.0000 mg | ORAL_TABLET | Freq: Once | ORAL | Status: AC
  Administered 2022-01-23: 20 mg via ORAL

## 2022-01-23 MED ORDER — POTASSIUM CHLORIDE CRYS ER 20 MEQ PO TBCR: 40.0000 meq | EXTENDED_RELEASE_TABLET | Freq: Two times a day (BID) | ORAL | Status: DC

## 2022-01-23 NOTE — Care Management Important Message (Signed)
Important Message  Patient Details  Name: [name] MRN: [id number] Date of Birth: July 16, 1956   Medicare Important Message Given:  Yes     Shelda Altes 01/23/2022, 10:40 AM

## 2022-01-23 NOTE — Plan of Care (Signed)
  Problem: Pain Managment: Goal: General experience of comfort will improve Outcome: Completed/Met   Problem: Safety: Goal: Ability to remain free from injury will improve Outcome: Completed/Met   Problem: Skin Integrity: Goal: Risk for impaired skin integrity will decrease Outcome: Completed/Met

## 2022-01-23 NOTE — Telephone Encounter (Signed)
Routed to PCP 

## 2022-01-23 NOTE — TOC CM/SW Note (Addendum)
HF TOC CM received call from [facility] and plan in not in network. Contacted [facility] rep, Amy with new referral. Able to accept referral for [facility]. They will schedule soc for 01/24/2022.   HF TOC CM, PT did not recommended HHPT. Pt states her boyfriend will provide transportation. Offered choice for HH (medicare.gov list placed in chart). Pt agreeable to [facility] for [facility] RN. Explained agency will call with appt time. [place], Heart Failure TOC CM [phone]

## 2022-01-23 NOTE — Evaluation (Signed)
Physical Therapy Evaluation Patient Details Name: [name] MRN: [id number] DOB: 10-Sep-1955 Today's Date: 01/23/2022  History of Present Illness  The pt is a 66 yo female presenting from MD visit on 5/30 with SOB, chest pain, and fluid overload. Pt found to have biventricular heart failure with severe tricuspid regurgitation. PMH includes: CAD, CHF, afib/flutter, valvular heart disease s/p MV and TV repair in 12/2020, HTN, HLD, DM II, CKD stage IV, obstructive sleep apnea, and obesity.   Clinical Impression  Pt in bed upon arrival of PT, agreeable to evaluation at this time. Prior to admission the pt was mobilizing with use of SPC in the home, independent with all ADLs and self-care without hx of falls. The pt now presents with limitations in functional mobility, strength, power, dynamic stability, and endurance due to above dx, and will continue to benefit from skilled PT to address these deficits. The pt was able to complete all sit-stand transfers and hallway ambulation without physical assist, did require increased time and effort as well as BUE support on RW for stability. The pt was educated in assistance for stairs and progressive walking program she can continue at home following d/c. Will be safe to return home with family when medically safe for d/c.   Gait Speed: 0.46ms using RW. (Gait speed <0.618m indicates increased risk of falls and dependence in ADLs)      Recommendations for follow up therapy are one component of a multi-disciplinary discharge planning process, led by the attending physician.  Recommendations may be updated based on patient status, additional functional criteria and insurance authorization.  Follow Up Recommendations No PT follow up    Assistance Recommended at Discharge PRN  Patient can return home with the following  Help with stairs or ramp for entrance;Assistance with cooking/housework    Equipment Recommendations None recommended by PT   Recommendations for Other Services       Functional Status Assessment Patient has had a recent decline in their functional status and demonstrates the ability to make significant improvements in function in a reasonable and predictable amount of time.     Precautions / Restrictions Precautions Precautions: Fall Restrictions Weight Bearing Restrictions: No      Mobility  Bed Mobility Overal bed mobility: Needs Assistance Bed Mobility: Supine to Sit     Supine to sit: Supervision, HOB elevated     General bed mobility comments: increased time and use of bed rails, reports she mostly sleeps in recliner at home    Transfers Overall transfer level: Modified independent Equipment used: Rolling walker (2 wheels)               General transfer comment: no assist, no instability    Ambulation/Gait Ambulation/Gait assistance: Supervision Gait Distance (Feet): 75 Feet (+ 75 ft after standing rest) Assistive device: Rolling walker (2 wheels) Gait Pattern/deviations: Step-through pattern, Decreased stride length Gait velocity: 0.31 m/s Gait velocity interpretation: <1.31 ft/sec, indicative of household ambulator   General Gait Details: slowed speed which pt reports is baseline. no overt LOB but SOB after 75 with SpO2 91% on RA       Balance Overall balance assessment: Mild deficits observed, not formally tested                                           Pertinent Vitals/Pain Pain Assessment Pain Assessment: Faces Faces Pain Scale: Hurts little  more Pain Location: LE, unna boots Pain Descriptors / Indicators: Discomfort Pain Intervention(s): Limited activity within patient's tolerance, Monitored during session, Repositioned    Home Living Family/patient expects to be discharged to:: Private residence Living Arrangements: Children Available Help at Discharge: Family;Available 24 hours/day Type of Home: Apartment Home Access: Stairs to  enter Entrance Stairs-Rails: Left Entrance Stairs-Number of Steps: down 1 + 1 with L rail, then up 2 with L rail   Home Layout: One level   Additional Comments: Lives with daughter and grandson; daughter works, but family able to assist without household tasks if needed    Prior Function Prior Level of Function : Independent/Modified Independent             Mobility Comments: Mod I using SPC, denies any recent falls. Does not drive ADLs Comments: pt reports no assist to shower/bathe, dress. used to cook more but now is limited by endurance.     Hand Dominance   Dominant Hand: Right    Extremity/Trunk Assessment   Upper Extremity Assessment Upper Extremity Assessment: Overall WFL for tasks assessed    Lower Extremity Assessment Lower Extremity Assessment: Overall WFL for tasks assessed (bilateral LE swelling with unna boots placed, reports mild numbness in feet)    Cervical / Trunk Assessment Cervical / Trunk Assessment: Other exceptions Cervical / Trunk Exceptions: large body habitus  Communication   Communication: No difficulties  Cognition Arousal/Alertness: Awake/alert Behavior During Therapy: WFL for tasks assessed/performed Overall Cognitive Status: Within Functional Limits for tasks assessed                                 General Comments: pt following all instructions and answering questions well        General Comments General comments (skin integrity, edema, etc.): VSS on RA        Assessment/Plan    PT Assessment Patient needs continued PT services  PT Problem List Cardiopulmonary status limiting activity;Decreased activity tolerance;Decreased mobility       PT Treatment Interventions DME instruction;Gait training;Functional mobility training;Stair training;Therapeutic activities;Balance training;Therapeutic exercise;Patient/family education    PT Goals (Current goals can be found in the Care Plan section)  Acute Rehab PT  Goals Patient Stated Goal: return home, to be able to get back to cooking PT Goal Formulation: With patient Time For Goal Achievement: 02/06/22 Potential to Achieve Goals: Good    Frequency Min 3X/week        AM-PAC PT "6 Clicks" Mobility  Outcome Measure Help needed turning from your back to your side while in a flat bed without using bedrails?: A Little Help needed moving from lying on your back to sitting on the side of a flat bed without using bedrails?: A Little Help needed moving to and from a bed to a chair (including a wheelchair)?: None Help needed standing up from a chair using your arms (e.g., wheelchair or bedside chair)?: None Help needed to walk in hospital room?: A Little Help needed climbing 3-5 steps with a railing? : A Little 6 Click Score: 20    End of Session Equipment Utilized During Treatment: Gait belt Activity Tolerance: Patient tolerated treatment well;Patient limited by fatigue Patient left: in bed;with call bell/phone within reach (sitting EOB) Nurse Communication: Mobility status PT Visit Diagnosis: Muscle weakness (generalized) (M62.81);Other abnormalities of gait and mobility (R26.89)    Time: 9379-0240 PT Time Calculation (min) (ACUTE ONLY): 25 min   Charges:  PT Evaluation $PT Eval Low Complexity: 1 Low PT Treatments $Therapeutic Exercise: 8-22 mins        West Carbo, PT, DPT   Acute Rehabilitation Department Pager #: [phone]  [name] 01/23/2022, 10:37 AM

## 2022-01-23 NOTE — Progress Notes (Signed)
Bilateral unna boots removed. Bilateral lower extremities cleansed with soap and water and dried. Ortho tech called to reapply unna boots prior to discharge.

## 2022-01-23 NOTE — Progress Notes (Signed)
Orthopedic Tech Progress Note Patient Details:  [name] 01/08/1956 [id number]  Ortho Devices Type of Ortho Device: Louretta Parma boot Ortho Device/Splint Location: BLE Ortho Device/Splint Interventions: Ordered, Application, Adjustment   Post Interventions Patient Tolerated: Well Instructions Provided: [facility] 01/23/2022, 11:21 AM

## 2022-01-23 NOTE — Progress Notes (Signed)
Discharge instructions given. Patient verbalized understanding and all questions were answered.  ?

## 2022-01-23 NOTE — Progress Notes (Addendum)
Advanced Heart Failure Rounding Note  PCP-Cardiologist: Buford Dresser, MD   Subjective:    Had CP over the weekend. HS troponin negative X 2. Evaluated by Cardiology. Not felt to be anginal. Low suspicion for pericarditis.  SCr 2.57>>2.49>>2.61>>2.99>>3.17>3.12>>3.12 BUN 30>43>51 CO2 33 K 3.1 Mag 2.0  Continues on PO Torsemide 40 TID + 2.5 mg metolazone three times weekly (M, W, F). 3.2L UOP yesterday + 2 unmeasured voids. Weight down 9 lb over the weekend, 298 lb today.   Feels better. Less dyspnea and abdominal bloating. Believes she is back to baseline.  BP 100s-110s   TEE, 06/02:  Severely reduced LV and RV function, severe BAE, severe TR, trivial MR, mean gradient 3 across mitral valve prosthesis  Objective:   Weight Range: 135.4 kg Body mass index is 46.75 kg/m.   Vital Signs:   Temp:  [97.6 F (36.4 C)-98 F (36.7 C)] 98 F (36.7 C) (06/05 0627) Pulse Rate:  [74-84] 76 (06/05 0627) Resp:  [18] 18 (06/05 0627) BP: (100-119)/(52-91) 103/61 (06/05 0627) SpO2:  [95 %-97 %] 97 % (06/04 1938) Weight:  [135.4 kg] 135.4 kg (06/05 0627) Last BM Date : 01/18/22  Weight change: Filed Weights   01/21/22 0622 01/22/22 0330 01/23/22 0627  Weight: (!) 137.9 kg (!) 136.6 kg 135.4 kg    Intake/Output:   Intake/Output Summary (Last 24 hours) at 01/23/2022 0700 Last data filed at 01/23/2022 0600 Gross per 24 hour  Intake 735 ml  Output 3225 ml  Net -2490 ml      Physical Exam    General:  No distress. Sitting up in bed. HEENT: normal Neck: supple. JVP appears elevated (in setting of severe TR). Carotids 2+ bilat; no bruits.  Cor: PMI nondisplaced. Regular rate & rhythm with ectopy. No rubs, gallops or murmurs. Lungs: clear Abdomen: obese, soft, nontender, nondistended.  Extremities: no cyanosis, clubbing, rash, trace edema, + UNNA Neuro: alert & orientedx3, cranial nerves grossly intact. moves all 4 extremities w/o difficulty. Affect  pleasant    Telemetry  SR 70s, PACs, ~ 10 PVCs/min  EKG    N/A   Labs    CBC Recent Labs    01/23/22 0318  WBC 5.2  HGB 8.7*  HCT 27.2*  MCV 85.5  PLT 267   Basic Metabolic Panel Recent Labs    01/22/22 0405 01/23/22 0318  NA 139 137  K 3.6 3.1*  CL 98 97*  CO2 31 33*  GLUCOSE 157* 133*  BUN 50* 51*  CREATININE 3.12* 3.12*  CALCIUM 8.9 9.1  MG  --  2.0   Liver Function Tests No results for input(s): AST, ALT, ALKPHOS, BILITOT, PROT, ALBUMIN in the last 72 hours.  No results for input(s): LIPASE, AMYLASE in the last 72 hours. Cardiac Enzymes No results for input(s): CKTOTAL, CKMB, CKMBINDEX, TROPONINI in the last 72 hours.  BNP: BNP (last 3 results) Recent Labs    12/14/21 1208 01/11/22 1134 01/17/22 1350  BNP 184.4* 304.3* 292.6*    ProBNP (last 3 results) No results for input(s): PROBNP in the last 8760 hours.   D-Dimer No results for input(s): DDIMER in the last 72 hours. Hemoglobin A1C No results for input(s): HGBA1C in the last 72 hours. Fasting Lipid Panel No results for input(s): CHOL, HDL, LDLCALC, TRIG, CHOLHDL, LDLDIRECT in the last 72 hours. Thyroid Function Tests No results for input(s): TSH, T4TOTAL, T3FREE, THYROIDAB in the last 72 hours.  Invalid input(s): FREET3  Other results:   Imaging  No results found.   Medications:     Scheduled Medications:  amiodarone  200 mg Oral Daily   apixaban  5 mg Oral BID   busPIRone  5 mg Oral BID   calcium carbonate  1 tablet Oral BID   DULoxetine  60 mg Oral Daily   insulin aspart  0-20 Units Subcutaneous TID WC   insulin aspart  0-5 Units Subcutaneous QHS   metolazone  2.5 mg Oral Once per day on Mon Wed Fri   metoprolol succinate  25 mg Oral BID   pantoprazole  40 mg Oral Daily   potassium chloride  20 mEq Oral Once   rosuvastatin  10 mg Oral QPM   torsemide  40 mg Oral TID    Infusions:    PRN Medications: acetaminophen **OR** acetaminophen    Patient  Profile   66 y.o. female with history of chronic biventricular systolic CHF, PAF/AFL, CKD IV, CAD, valvular heart disease, DMII, OSA. Multiple admissions for decompensated HF mostly in setting of recurrent AF.    Now presenting with a/c CHF.     Assessment/Plan   1. Acute on chronic Biventricular Systolic Heart Failure: - RHC (6/22) with low filling pressures with moderately reduced CO. - Echo (7/22) in setting of AFL with RVR, showed EF 30-35%, moderate LVH, moderate RV dysfunction, PASP 48, s/p MV repair with mild MR and mean gradient 10 mmHg with HR around 130, s/p TV repair with mild-moderate TR, dilated IVC.   - RHC 4/23 markedly elevated biventricular pressures - Echo 4/23 35-40% RV moderately down severe TR. Moderate MS mean grad 67m HG - TEE 06/02: Severe biventricular dysfunction, trivial MR, mean gradient 3 across MV prosthesis, severe TR - Multiple admissions recently for HF mostly occur in setting of recurrent AF. Currently in NSR. - NYHA IIIb/IV.  - Weight down 9 lb over the weekend, however Scr has trended up to 3.1 (2.6 on admit) -Continues on Torsemide 40 TID + 2.5 mg metolazone M, W, F. Volume looks about as good as we can get. BUN creeping up and CO2 elevated. Hold today's dose of metolazone.  - Intolerant of AF. Currently maintaining SR.   - GDMT limited by AKI on CKD. No BP room for bidil. - Continue toprol xl 25 mg bid - Worry may have end-stage disease. At some point, HD may be only way to manage volume status. - UNNA boots   2. PAF/AFL - She has failed Tikosyn. - She had Maze in 5/22 and DCCV in 6/22 & 04/19/21, last of which she had ERAF. - seen by EP 10/22. Considering ablation vs PPM/AV nodal ablation if AF/AFL returns - Multiple admissions recently for HF mostly occur in setting of recurrent AF. Last DC-CV 11/24/21 - AFL at f/u 05/01, back in SR at f/u 05/16. SR w/ PACs today  - Continue amiodarone + Eliquis  - Unable to tolerate AF at all. Follows with EP. Not  a candidate for Afib ablation or AV nodal ablation/PPM - Has OSA, awaiting split night study for CPAP titration.    3. CKD Stage IV   - New baseline creatinine  ~ 2.6 - 2.8. 2.6 on admit - SCr trending up 2.49>>2.61>>2.88>>2.99>>3.1 last 3 days. BUN and CO2 trending up. Holding today's metolazone as above. - No SGLT2i, GFR has been consistently less than 20 - Follows with Nephrology - Avoid hypotension   4. CAD - Cath (2019): Mild nonobstructive CAD -  No chest pain.  - Continue statin + beta  blocker - no ASA w/ Eliquis    5. Valvular Heart Disease: - S/p MV and TV repair in 5/22.  - Echo 02/25/21 with mild-moderate TR.   - Echo 4/23 35-40% RV moderately down. severe TR. Moderate MS mean gradient 39mHG  - TEE 06/23: severe biventricular dysfunction, trivial MR, mean gradient 3 across MV prosthesis, severe TR - Dr. BHaroldine Lawsdiscussed case with Structural Team to see if she is a candidate for TV VIV. Reviewed with Structural Team again this am. Arranging for outpatient f/u.    6. Obesity - Body mass index is 46.75 kg/m. -Discussed portion control.    7. DMII - Hgb A1c 6.7% 04/23 - GFR has been too low for SGLT2i   8. OSA - initial sleep study positive but was awaiting split night study to titrate CPAP - Has been referred to Dr. TRadford Pax- suspect this is part of her AF recurrence  9. Hypokalemia/Hypomagnesemia - K 3.1, Mag 2.0 - Supp K today - follow closely w/ diuresis   10. PVCs - ~ 10/min on tele - Supp K today - Already on PO amiodarone for AF/AFL   Likely stable for discharge today from HF perspective. Will review with Dr. MAundra Dubin  Has f/u in HF clinic scheduled 06/08. Will need BMET/BNP. Structural Team also arranging outpatient f/u.   HF medications at discharge: -Amiodarone 200 mg daily -Apixaban 5 mg BID -Metoprolol succinate 25 mg BID -Rosuvastatin 10 mg daily -Torsemide 60 mg BID -Hold today's Metolazone. Resume metolazone three times weekly (M, W, F)  on 06/07. Will decide on further adjustments at f/u.  - Potassium chloride 20 mEq daily + extra 20 mEq when taking metolazone    Length of Stay: 6  FINCH, LINDSAY N, PA-C  01/23/2022, 7:00 AM  Advanced Heart Failure Team Pager [phone](M-F; 7a - 5p)  Please contact [facility] for night-coverage after hours (5p -7a ) and weekends on [email or web address]  Patient seen with PA, agree with the above note.   No complaints this morning, no dyspnea.    General: NAD Neck: JVP 8 cm, no thyromegaly or thyroid nodule.  Lungs: Clear to auscultation bilaterally with normal respiratory effort. CV: Nondisplaced PMI.  Heart regular S1/S2, no S3/S4, 2/6 HSM LLSB.  No peripheral edema.   Abdomen: Soft, nontender, no hepatosplenomegaly, no distention.  Skin: Intact without lesions or rashes.  Neurologic: Alert and oriented x 3.  Psych: Normal affect. Extremities: No clubbing or cyanosis.  HEENT: Normal.   Volume status looks ok today.  She is on a stable po regimen.  Think she can go home with followup in CHF clinic and structural heart clinic for TR evaluation.   DLoralie Champagne6/12/2021 9:28 AM

## 2022-01-23 NOTE — Discharge Summary (Signed)
Physician Discharge Summary   Patient: [name] MRN: [id number] DOB: [birth year]  Admit date:     01/17/2022  Discharge date: 01/23/22  Discharge Physician: Patrecia Pour   PCP: [name], NP   Recommendations at discharge:  Follow up with cardiology, Dr. Burt Knack 6/7, recommend repeat BMP, weight, volume assessment.  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 female with a history of chronic combined HFrEF, severe TR s/p annuloplasty, AFib s/p MAZE May 2022, morbid obesity (BMI 47), and T2DM who presented to the ED 5/30 with shortness of breath and was readmitted with recurrent CHF exacerbation. [name] was admitted to the hospital with the working diagnosis of decompensated heart failure. Cardiology was consulted and lasix infusion started, ultimately weaned and started back on oral diuretics with continued improvement in volume status. On 6/5, she is felt to be medically optimized for discharge with medications as dictated below and follow up with HF clinic and structural cardiology scheduled.   Assessment and Plan: Acute on chronic combined systolic and diastolic biventricular heart failure: LVEF 35-40% with global hypokinesis, moderately reduced RV systolic function with RVSP 46.9, BAE, mild MS, severe TR.  - Continue torsemide 14m BID, metolazone 2.566mon MWF. - Monitor daily weight, down 139kg >> 135.4kg on day of discharge. - Not currently on milrinone.  - Continue metoprolol succinate.  - GDMT limited by renal dysfunction.  - Unna boots recommended, will order HH-RN for this. Per CM, payor may not cover  this.  Severe tricuspid insufficiency: With RV failure, peripheral volume overload. TEE confirmed severe TR.  - Follow up arranged to consider further management (e.g. VIV)   PAF: Maintaining NSR on amiodarone. Failed tikosyn, s/p MAZE May 2022 and failed DCCV in June and Aug 2022 (ERAF), April 2023. - Intolerant of atrial fibrillation, exacerbates CHF. EP considering ablation vs. AV node ablation w/PPM if unable to maintain sinus rhythm. - Continue rhythm control strategy with amiodarone 20080mo daily (last TSH 2.224), continue metoprolol succinate 14m23mD, eliquis - Tx OSA     Chest pain: ACS ruled out w/normal troponins. Pericarditis unlikely with normal CRP. ESR elevated with unclear etiology/significance in absence of fever, leukocytosis   AKI on stage IV CKD: Creatinine elevated from baseline but stabilized on oral diuretic regimen.  - Will need repeat metabolic panel at follow up.  - Nephrology follow up recommended. ?if she may require dialysis in the future for volume management with recurrent hospitalizations from CHF.   Hypokalemia:  - Continue supplementation (20mE45mily + additional 20mEq51mMWF when taking metolazone.    Hypomagnesemia:  - Supplement as needed    HTN: Normotensive - Avoid hypotension.    T2DM: HbA1c 6.7% suggesting good chronic control.  - Continue home dulaglutide, novolog at discharge   HLD:  - Continue rosuvastatin   Nonobstructive CAD: No ACS.  - Continue anticoagulation, BB, rosuvastatin   Depression, anxiety: Quiescent.  - Continue home buspirone, duloxetine.    OSA:  - Continue CPAP qHS.    Anemia of CKD: Hb is stable at 8.7.  - Defer consideration of ESA to nephrology.   Morbid obesity: Body mass  index is 47.17 kg/m.  - Weight loss recommended. Pt is on dulaglutide, consider switch to semaglutide  Consultants: Cardiology/HF team Procedures performed: TEE  Disposition: Home Diet recommendation:  Cardiac and Carb modified  diet DISCHARGE MEDICATION: Allergies as of 01/23/2022       Reactions   Bee Pollen Anaphylaxis   Sulfa Antibiotics Itching        Medication List     STOP taking these medications    amoxicillin-clavulanate 875-125 MG tablet Commonly known as: AUGMENTIN   ondansetron 4 MG disintegrating tablet Commonly known as: ZOFRAN-ODT       TAKE these medications    Accu-Chek Guide Me w/Device Kit Use as instructed to check blood sugar three times daily. E11.69   Accu-Chek Guide test strip Generic drug: glucose blood Use to check blood sugar TID. E11.69   acetaminophen 325 MG tablet Commonly known as: TYLENOL Take 2 tablets (650 mg total) by mouth every 6 (six) hours as needed for mild pain or moderate pain.   albuterol 108 (90 Base) MCG/ACT inhaler Commonly known as: VENTOLIN HFA Inhale 1-2 puffs into the lungs every 6 (six) hours as needed for wheezing or shortness of breath.   amiodarone 200 MG tablet Commonly known as: PACERONE Take 1 tablet (200 mg total) by mouth daily.   apixaban 5 MG Tabs tablet Commonly known as: Eliquis Take 1 tablet (5 mg total) by mouth 2 (two) times daily.   B-D SINGLE USE SWABS REGULAR Pads 1 each by Other route daily.   Alcohol Swabs Pads 1 application by Does not apply route in the morning, at noon, and at bedtime.   BENADRYL ITCH RELIEF EX Apply 1 application. topically daily as needed (itchyness).   BENADRYL PO Take 1 tablet by mouth daily as needed (itchyness).   blood glucose meter kit and supplies Dispense based on patient and insurance preference. Use up to four times daily as directed. (FOR ICD-10 E10.9, E11.9).   blood glucose meter kit and supplies Kit Dispense based on patient and insurance preference. Use up to four times daily as directed.   busPIRone 5 MG tablet Commonly known as: BUSPAR Take 1 tablet (5 mg total) by mouth 2 (two) times daily.   colchicine 0.6 MG tablet Take 0.5 tablets (0.3 mg total) by mouth  daily.   docusate sodium 100 MG capsule Commonly known as: COLACE Take 100 mg by mouth 2 (two) times daily.   DULoxetine 60 MG capsule Commonly known as: CYMBALTA Take 1 capsule (60 mg total) by mouth daily.   EPINEPHrine 0.3 mg/0.3 mL Soaj injection Commonly known as: EPI-PEN Inject 0.3 mg into the muscle as needed for anaphylaxis.   fluticasone 50 MCG/ACT nasal spray Commonly known as: FLONASE Place 2 sprays into both nostrils daily.   lidocaine 5 % ointment Commonly known as: XYLOCAINE Apply 1 application. topically as needed for moderate pain. First choice is lidocaine patch, but if not covered/expensive, please fill ointment What changed:  when to take this additional instructions   loratadine 10 MG tablet Commonly known as: CLARITIN Take 1 tablet (10 mg total) by mouth daily.   metolazone 2.5 MG tablet Commonly known as: ZAROXOLYN Take 2.5 mg by mouth 3 (three) times a week. Monday Wednesday and Friday.   metoprolol succinate 25 MG 24 hr tablet Commonly known as: TOPROL-XL Take 1 tablet (25 mg total) by mouth in the morning and at bedtime.   MULTIVITAMIN WOMEN PO Take 1 tablet by mouth daily.   nitroGLYCERIN 0.4  MG SL tablet Commonly known as: NITROSTAT Place 1 tablet (0.4 mg total) under the tongue every 5 (five) minutes x 3 doses as needed for chest pain.   NovoLOG 100 UNIT/ML injection Generic drug: insulin aspart Inject 2-10 Units into the skin 3 (three) times daily before meals. Per sliding scale   pantoprazole 40 MG tablet Commonly known as: PROTONIX TAKE ONE TABLET BY MOUTH DAILY AT 9AM What changed: See the new instructions.   potassium chloride SA 20 MEQ tablet Commonly known as: KLOR-CON M Take 2 tablets (40 mEq total) by mouth daily. Take an additional 20 meq with every dose of METOLAZONE   Restasis 0.05 % ophthalmic emulsion Generic drug: cycloSPORINE Place 1 drop into both eyes 2 (two) times daily.   rosuvastatin 10 MG tablet Commonly  known as: CRESTOR Take 1 tablet (10 mg total) by mouth every evening.   torsemide 20 MG tablet Commonly known as: DEMADEX Take 3 tablets (60 mg total) by mouth 2 (two) times daily. What changed:  how much to take when to take this   Trulicity 1.5 TD/1.7OH Sopn Generic drug: Dulaglutide Inject 1.5 mg into the skin every Wednesday. Start taking on: January 25, 2022        Follow-up Information     Sherren Mocha, MD. Go on 01/25/2022.   Specialty: Cardiology Why: @ 8am. Please arrive at least 10 minutes early Contact information: [address] [place] [address] [phone]         [name], NP. Go on 02/06/2022.   Specialty: Internal Medicine Why: _0 :30am Contact information: [address] [place] [address] [phone]         Buford Dresser, MD .   Specialty: Cardiology Contact information: [address] [place] [address] [phone]         Vickie Epley, MD .   Specialties: Cardiology, Radiology Contact information: [place] [address] [phone]                Discharge Exam: Danley Danker Weights   01/21/22 0622 01/22/22 0330 01/23/22 0627  Weight: (!) 137.9 kg (!) 136.6 kg 135.4 kg  BP 92/66 (BP Location: Left Wrist)   Pulse 83   Temp 97.6 F (36.4 C) (Oral)   Resp 20   Ht 5' 7" (1.702 m)   Wt 135.4 kg   SpO2 96%   BMI 46.75 kg/m   Obese, pleasant female in no distress +JVD Clear, nonlabored on room air II/VI systolic murmur at left sternal border, unna boots in place.  Condition at discharge: stable  The results of significant diagnostics from this hospitalization (including imaging, microbiology, ancillary and laboratory) are listed below for reference.   Imaging Studies: DG Chest 2 View  Result Date: 01/17/2022 CLINICAL DATA:  Shortness of breath with lower extremity swelling and chest pain. History of congestive heart failure,  asthma, diabetes and hypertension EXAM: CHEST - 2 VIEW COMPARISON:  CT 11/21/2021.  Radiographs 11/21/2021. FINDINGS: 1404 hours. Stable cardiomegaly post median sternotomy, mitral and tricuspid valve repair and left atrial appendage clipping. Stable mild vascular congestion without overt pulmonary edema. No pleural effusion or pneumothorax. The bones appear unchanged. IMPRESSION: Stable postoperative chest.  No acute cardiopulmonary process. Electronically Signed   By: Richardean Sale M.D.   On: 01/17/2022 14:22   ECHO TEE  Result Date: 01/20/2022    TRANSESOPHOGEAL ECHO REPORT   Patient Name:   TAMILA GAULIN Date of  Exam: 01/20/2022 Medical Rec #:  [id number]      Height:       67.0 in Accession #:    [id number]     Weight:       308.0 lb Date of Birth:  Oct 01, 1955      BSA:          2.430 m Patient Age:    26 years       BP:           122/58 mmHg Patient Gender: F              HR:           63 bpm. Exam Location:  Inpatient Procedure: Transesophageal Echo, Color Doppler, Cardiac Doppler and 3D Echo Indications:     Tricuspid regurgitation  History:         Patient has prior history of Echocardiogram examinations, most                  recent 11/22/2021. CHF, Mitral Valve Disease and TV Disease; Risk                  Factors:Sleep Apnea, Dyslipidemia, Diabetes and Hypertension.                  30 mm CARBOMEDICS ANNULOFLEX RING prosthetic annuloplasty ring                  present in the mitral position. Procedure Date: 12/29/20.                  Tricuspid annuloplasty repair. The tricuspid valve is has been                  repaired/replaced.  Sonographer:     Eartha Inch Sonographer#2:   Clayton Lefort RDCS (AE) Referring Phys:  [id number] AMY D CLEGG Diagnosing Phys: Glori Bickers MD PROCEDURE: After discussion of the risks and benefits of a TEE, an informed consent was obtained from the patient. The transesophogeal probe was passed without difficulty through the esophogus of the patient. Sedation performed by  different physician. The patient was monitored while under deep sedation. Anesthestetic sedation was provided intravenously by Anesthesiology: 300.82m of Propofol, 440mof Lidocaine. Image quality was good. The patient developed no complications during the procedure. IMPRESSIONS  1. Left ventricular ejection fraction, by estimation, is 55%. The left ventricle has severely decreased function. The left ventricle has no regional wall motion abnormalities.  2. Right ventricular systolic function is severely reduced. The right ventricular size is severely enlarged.  3. Left atrial size was severely dilated. No left atrial/left atrial appendage thrombus was detected.  4. Right atrial size was severely dilated.  5. The mitral valve has been repaired/replaced. Trivial mitral valve regurgitation. Mild mitral stenosis. The mean mitral valve gradient is 3.0 mmHg.  6. The RA and RV are severely dilated. The tricuspid valve leaflets do no coapt. The tricuspid valve is has been repaired/replaced. The tricuspid valve is status post repair with an annuloplasty ring. Tricuspid valve regurgitation is severe.  7. The aortic valve is tricuspid. Aortic valve regurgitation is not visualized. FINDINGS  Left Ventricle: Left ventricular ejection fraction, by estimation, is 55%. The left ventricle has severely decreased function. The left ventricle has no regional wall motion abnormalities. The left ventricular internal cavity size was normal in size. Right Ventricle: The right ventricular size is severely enlarged. No increase in right ventricular wall thickness. Right ventricular systolic function is severely  reduced. Left Atrium: Left atrial size was severely dilated. No left atrial/left atrial appendage thrombus was detected. Right Atrium: Right atrial size was severely dilated. Pericardium: There is no evidence of pericardial effusion. Mitral Valve: The mitral valve has been repaired/replaced. Trivial mitral valve regurgitation. There  is a bioprosthetic valve present in the mitral position. Mild mitral valve stenosis. MV peak gradient, 14.1 mmHg. The mean mitral valve gradient is 3.0 mmHg. Tricuspid Valve: The RA and RV are severely dilated. The tricuspid valve leaflets do no coapt. The tricuspid valve is has been repaired/replaced. Tricuspid valve regurgitation is severe. No evidence of tricuspid stenosis. The tricuspid valve is status post repair with an annuloplasty ring. Aortic Valve: The aortic valve is tricuspid. Aortic valve regurgitation is not visualized. Pulmonic Valve: The pulmonic valve was normal in structure. Pulmonic valve regurgitation is trivial. Aorta: The ascending aorta was not well visualized and the aortic root is normal in size and structure. IAS/Shunts: No atrial level shunt detected by color flow Doppler.  MITRAL VALVE            TRICUSPID VALVE MV Peak grad: 14.1 mmHg TV Peak grad:   3.5 mmHg MV Mean grad: 3.0 mmHg  TV Mean grad:   1.5 mmHg MV Vmax:      1.88 m/s  TV Vmax:        0.94 m/s MV Vmean:     73.6 cm/s TV Vmean:       64.4 cm/s                         TV VTI:         0.28 msec                         TR Peak grad:   31.4 mmHg                         TR Vmax:        280.00 cm/s Glori Bickers MD Electronically signed by Glori Bickers MD Signature Date/Time: 01/20/2022/1:55:36 PM    Final     Microbiology: Results for orders placed or performed during the hospital encounter of 01/17/22  SARS Coronavirus 2 by RT PCR (hospital order, performed in [facility] hospital lab) *cepheid single result test* Anterior Nasal Swab     Status: None   Collection Time: 01/17/22  6:18 PM   Specimen: Anterior Nasal Swab  Result Value Ref Range Status   SARS Coronavirus 2 by RT PCR NEGATIVE NEGATIVE Final    Comment: (NOTE) SARS-CoV-2 target nucleic acids are NOT DETECTED.  The SARS-CoV-2 RNA is generally detectable in upper and lower respiratory specimens during the acute phase of infection. The  lowest concentration of SARS-CoV-2 viral copies this assay can detect is 250 copies / mL. A negative result does not preclude SARS-CoV-2 infection and should not be used as the sole basis for treatment or other patient management decisions.  A negative result may occur with improper specimen collection / handling, submission of specimen other than nasopharyngeal swab, presence of viral mutation(s) within the areas targeted by this assay, and inadequate number of viral copies (<250 copies / mL). A negative result must be combined with clinical observations, patient history, and epidemiological information.  Fact Sheet for Patients:   https://www.patel.info/  Fact Sheet for Healthcare Providers: https://hall.com/  This test is not yet approved or  cleared by the  [place] Architectural [employer]ologist and has been authorized for detection and/or diagnosis of SARS-CoV-2 by FDA under an Print production planner (EUA).  This EUA will remain in effect (meaning this test can be used) for the duration of the COVID-19 declaration under Section 564(b)(1) of the Act, 21 U.S.C. section 360bbb-3(b)(1), unless the authorization is terminated or revoked sooner.  Performed at [facility], [place] [address] [place], [place] [address]     Labs: CBC: Recent Labs  Lab 01/17/22 1350 01/23/22 0318  WBC 4.6 5.2  HGB 8.7* 8.7*  HCT 29.2* 27.2*  MCV 89.3 85.5  PLT 191 829   Basic Metabolic Panel: Recent Labs  Lab 01/17/22 1350 01/18/22 0354 01/18/22 1424 01/19/22 0354 01/20/22 0451 01/21/22 0321 01/22/22 0405 01/23/22 0318  NA 141 137   < > 142 139 139 139 137  K 3.3* 3.1*   < > 4.0 4.2 3.4* 3.6 3.1*  CL 106 104   < > 106 101 98 98 97*  CO2 27 25   < > 29 29 32 31 33*  GLUCOSE 116* 161*   < > 136* 110* 137* 157* 133*  BUN 25* 26*   < > 30* 36* 43* 50* 51*  CREATININE 2.57* 2.49*   < > 2.88* 2.99* 3.17* 3.12* 3.12*  CALCIUM 9.2 8.6*   < > 9.1 9.2 9.0 8.9  9.1  MG 1.9 1.8  --  2.1 1.9  --   --  2.0   < > = values in this interval not displayed.   Liver Function Tests: Recent Labs  Lab 01/17/22 1350  AST 23  ALT 15  ALKPHOS 132*  BILITOT 0.8  PROT 7.6  ALBUMIN 3.4*   CBG: Recent Labs  Lab 01/22/22 1111 01/22/22 1609 01/22/22 2111 01/23/22 0626 01/23/22 1048  GLUCAP 136* 175* 174* 108* 166*    Discharge time spent: greater than 30 minutes.  Signed: Patrecia Pour, MD [facility] 01/23/2022

## 2022-01-24 ENCOUNTER — Encounter: Payer: Self-pay | Admitting: Physician Assistant

## 2022-01-24 ENCOUNTER — Telehealth: Payer: Self-pay

## 2022-01-24 NOTE — Telephone Encounter (Signed)
Transition Care Management Unsuccessful Follow-up Telephone Call  Date of discharge and from where:  01/23/2022, [facility]  Attempts:  1st Attempt  Reason for unsuccessful TCM follow-up call:  Left voice message [phone], call back requested.  Patient has an appointment with Juluis Mire, NP @ [facility]- 02/06/2022.

## 2022-01-25 ENCOUNTER — Telehealth: Payer: Self-pay

## 2022-01-25 ENCOUNTER — Telehealth (HOSPITAL_COMMUNITY): Payer: Self-pay

## 2022-01-25 ENCOUNTER — Encounter: Payer: Self-pay | Admitting: Cardiovascular Disease

## 2022-01-25 ENCOUNTER — Ambulatory Visit (INDEPENDENT_AMBULATORY_CARE_PROVIDER_SITE_OTHER): Payer: 59 | Admitting: Cardiovascular Disease

## 2022-01-25 VITALS — BP 132/62 | HR 86 | Ht 67.0 in | Wt 290.6 lb

## 2022-01-25 DIAGNOSIS — T8201XA Breakdown (mechanical) of heart valve prosthesis, initial encounter: Secondary | ICD-10-CM

## 2022-01-25 NOTE — Telephone Encounter (Signed)
Transition Care Management Unsuccessful Follow-up Telephone Call  Date of discharge and from where:   01/23/2022, [facility]   Attempts:  2nd Attempt  Reason for unsuccessful TCM follow-up call:  Left voice message on #  [phone], call back requested.   Patient has an appointment with Juluis Mire, NP @ [facility]- 02/06/2022.

## 2022-01-25 NOTE — Progress Notes (Addendum)
Advanced Heart Failure Clinic Note    PCP: [name], NP PCP-Cardiologist: Buford Dresser, MD  [facility]: Dr. [name]   Reason for Visit: Heart Failure F/u  HPI: [name] is a 66 y.o. with a history of morbid obesity, chronic diastolic and systolic HF with mildly reduced LVEF (45-50%), severe MR/TR, atrial fibrillation on Eliquis, HTN, and DMII.    She has a known history of HF with mildly reduced LVEF of 45-50% with cath in 2019 with mild, non-obstructive CAD. Also with history of difficult to control Afib with failed Tikosyn, later switched to amiodarone, dig, metop and apixaban for [facility].    Admitted 5//22 with worsening HF.  LHC 5/19 non-obstructive CAD. RHC 12/22/20 with elevated filling pressures and low CI.    Underwent MV/TV repair with Maze on 12/30/20. Post-operatively, she struggled  with low output progressive renal failure and volume overload.  Post operative Echo showed LVEF 55% RV moderately HK. Moderate TR. No significant MR. D/c wt was 281 lb. SCr was 2.8. She was also in Afib day of d/c.    Admitted 6/22 for A/CHF and AF diuresed with IV lasix, RHC showed low filling pressures and moderately reduced CO. She underwent successful DCCV 02/01/21. 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    Seen in ED 02/07/21 fShe was back in Afib, rate controlled. She was loaded w amiodarone gtt, successful DCCV on 02/09/21.    Back in AF 7/22. Repeat echo showed new reduction in EF, in setting of AFL with RVR,  EF 30-35%, moderate LVH, moderate RV dysfunction. Transitioned to po amio and torsemide, discharge weight 271 lbs.   S/p DCCV 04/19/21, but back in afib at follow up a week later at [facility] appointment.   Admitted 9/22 with CP and AKI on CKD IV. SCr 3.12 in ED, likely due to over diuresis. discharge, weight 268 lbs.  Admitted 1/23 x 2  with respiratory distress due to HF and COVID, Had recurrent AF. Echo EF 45%  Admitted 4/23 with recurrent HF in setting of recurrent  AF. Echo 35-40% RV moderately down severe TR. [place] 11/24/21 notable for severely elevated biventricular pressures. Started on milrinone and IV lasix.   Underwent DC-CV to NSR on 11/25/21. Loaded with IV amio. Developed severe stuttering. Code Stroke called. CT/MRI normal. Resolved spontaneously. Developed AKI. Discharged home 12/07/21. Scr 2.9. Wt 295   RA = 29 RV = 65/30 PA = 67/37 (45) PCW = 30 (v waves to 45) Fick cardiac output/index = 6.2/2.5 PVR = 2.4 WU Ao sat = 93% PA sat = 50%, 55% PAPi = 1.0   Readmitted 05/30-06/05/23 with a/c CHF after failing escalation of diuretics in outpatient setting. Initially required lasix gtt which was later transitioned to PO torsemide 60 BID + 2.5 mg metolazone M, W, F. TEE during admit with severe biventricular dysfunction, stable MV prosthesis and severe TR. At discharge she was referred to structural heart team for consideration of TV VIV.  She saw Dr. Burt Knack yesterday, she is hoping she will be candidate for TV VIV.  Here today for hospital follow-up. Reports less dyspnea and leg edema than she's had in a while. No CP. She denies palpitations. Home weight since discharge 289-291 lb. She does feel a little off, can tell she's back in atrial flutter. Taking all medications as prescribed. Watching fluid and sodium intake.   Review of systems complete and found to be negative unless listed in HPI.    Cardiac Studies - Echo (11/13): EF 60-65%,  moderate TR - Echo (11/14): EF 60-65%, severe TR, PA peak pressure 47 mmHg - Echo (5/15): EF 50-55% - Exercise stress test (7/6): A. Fib. with controlled vent. response OLD inferior MI and non sp. T wave changes at baseline. - Echo (8/17): EF 50-55%, moderate MR - Exercise stress test (8/17): Atrial Fib. Q wave in lead 3 and AVF with nonsp T wave changes at baseline - Echo (5/19): EF 55-60%, moderate MR - Exercise stress test (5/19): Afib, normal. - LHC (5/19): Prox LAD lesion is 10% stenosed, Dist LM to Ost  LAD lesion is 10% stenosed, Ost Cx lesion is 15% stenosed, LV systolic function normal, LVEDP is normal. - Echo (11/19): EF 45-50%, severe MR/TR - Echo (11/21): EF 45-50%, severe MR (rheumatic)/TR - Echo (4/22): EF 45-50%, severe MR mean gradient 2.0 mmHg, severe TR - Echo (5/22): EF 60-65%, s/p MVR, trace MR mean gradient 7.0 mmHg, moderate TR - RHC (5/22): Elevated R > L heart filling pressures with PAPi low but not markedly so (1.8) suggestive of a significant component of RV dysfunction. Prominent v-waves in PCWP and RA pressure tracings suggestive of significant MR and TR. Cardiac index low. - RHC (6/22): low filling pressures, moderate reduced CO. - Echo (7/22): EF 30-35%, moderate LVH, moderate RV dysfunction, PASP 48, s/p MV repair with mild MR and mean gradient 10 mmHg with HR around 130, s/p TV repair with mild-moderate TR, dilated IVC.   RHC 01/31/21  RA = 5 RV = 33/2 PA = 35/13 (22) PCW = 13 Fick cardiac output/index = 4.3/2.0 PVR = 2.1 WU FA sat = 97% PA sat = 54%, 54% - DCCV (02/01/21): successful conversion to NSR. - DCCV (02/09/21): successful conversion to NSR. -DCCV (830/22): successful conversion to NSR.  ROS: All systems negative except as listed in HPI, PMH and Problem List.  SH:  Social History   Socioeconomic History   Marital status: Significant Other    Spouse name: Not on file   Number of children: 2   Years of education: Not on file   Highest education level: Some college, no degree  Occupational History   Not on file  Tobacco Use   Smoking status: Never   Smokeless tobacco: Never  Vaping Use   Vaping Use: Never used  Substance and Sexual Activity   Alcohol use: No   Drug use: No   Sexual activity: Not Currently  Other Topics Concern   Not on file  Social History Narrative   Pt lives in [place] with spouse.  2 grown children.   Previously worked in [employer] at [employer].  Now on disability   Social Determinants of Health   Financial Resource  Strain: Low Risk  (01/19/2022)   Overall Financial Resource Strain (CARDIA)    Difficulty of Paying Living Expenses: Not hard at all  Food Insecurity: No Food Insecurity (01/19/2022)   Hunger Vital Sign    Worried About Running Out of Food in the Last Year: Never true    Ran Out of Food in the Last Year: Never true  Transportation Needs: No Transportation Needs (01/19/2022)   PRAPARE - Hydrologist (Medical): No    Lack of Transportation (Non-Medical): No  Physical Activity: Insufficiently Active (10/28/2021)   Exercise Vital Sign    Days of Exercise per Week: 2 days    Minutes of Exercise per Session: 70 min  Stress: No Stress Concern Present (10/28/2021)   Central  Stress Questionnaire    Feeling of Stress : Not at all  Social Connections: Socially Isolated (10/28/2021)   Social Connection and Isolation Panel [NHANES]    Frequency of Communication with Friends and Family: More than three times a week    Frequency of Social Gatherings with Friends and Family: Three times a week    Attends Religious Services: Never    Active Member of Clubs or Organizations: No    Attends Archivist Meetings: Never    Marital Status: Widowed  Intimate Partner Violence: Not At Risk (10/28/2021)   Humiliation, Afraid, Rape, and Kick questionnaire    Fear of Current or Ex-Partner: No    Emotionally Abused: No    Physically Abused: No    Sexually Abused: No   FH:  Family History  Problem Relation Age of Onset   Breast cancer Mother    Cancer Mother    Hypertension Mother    Cancer Father    Hypertension Sister    Hypertension Brother    CAD Other    Past Medical History:  Diagnosis Date   Acute on chronic systolic CHF (congestive heart failure) ([place]) 12/21/2020   Allergic rhinitis    Arthritis    Asthma    Chronic diastolic CHF (congestive heart failure) ([facility])    COPD (chronic obstructive pulmonary disease) (H[facility])     Depression    DM (diabetes mellitus) ([place])    DVT (deep vein thrombosis) in pregnancy    GERD (gastroesophageal reflux disease)    HTN (hypertension)    Hyperlipidemia    Obesity    Pneumonia 04/2018   RIGHT LOBE   Sleep apnea    compliant with CPAP   Current Outpatient Medications  Medication Sig Dispense Refill   acetaminophen (TYLENOL) 325 MG tablet Take 2 tablets (650 mg total) by mouth every 6 (six) hours as needed for mild pain or moderate pain.     albuterol (VENTOLIN HFA) 108 (90 Base) MCG/ACT inhaler Inhale 1-2 puffs into the lungs every 6 (six) hours as needed for wheezing or shortness of breath. 18 g 1   Alcohol Swabs (B-D SINGLE USE SWABS REGULAR) PADS 1 each by Other route daily. 100 each 11   Alcohol Swabs PADS 1 application by Does not apply route in the morning, at noon, and at bedtime. 120 each 11   amiodarone (PACERONE) 200 MG tablet Take 1 tablet (200 mg total) by mouth daily. 90 tablet 3   apixaban (ELIQUIS) 5 MG TABS tablet Take 1 tablet (5 mg total) by mouth 2 (two) times daily. 180 tablet 3   blood glucose meter kit and supplies KIT Dispense based on patient and insurance preference. Use up to four times daily as directed. 1 each 0   blood glucose meter kit and supplies Dispense based on patient and insurance preference. Use up to four times daily as directed. (FOR ICD-10 E10.9, E11.9). 1 each 0   Blood Glucose Monitoring Suppl (ACCU-CHEK GUIDE ME) w/Device KIT Use as instructed to check blood sugar three times daily. E11.69 1 kit 0   busPIRone (BUSPAR) 5 MG tablet Take 1 tablet (5 mg total) by mouth 2 (two) times daily. 180 tablet 1   colchicine 0.6 MG tablet Take 0.5 tablets (0.3 mg total) by mouth daily. 45 tablet 0   diphenhydrAMINE HCl (BENADRYL PO) Take 1 tablet by mouth daily as needed (itchyness).     diphenhydrAMINE-Zinc Acetate (BENADRYL ITCH RELIEF EX) Apply 1 application. topically daily as needed (itchyness).  docusate sodium (COLACE) 100 MG  capsule Take 100 mg by mouth 2 (two) times daily.     Dulaglutide (TRULICITY) 1.5 WI/0.9BD SOPN Inject 1.5 mg into the skin every Wednesday. 0.5 mL 4   DULoxetine (CYMBALTA) 60 MG capsule Take 1 capsule (60 mg total) by mouth daily. 90 capsule 3   EPINEPHrine 0.3 mg/0.3 mL IJ SOAJ injection Inject 0.3 mg into the muscle as needed for anaphylaxis. 1 each 1   fluticasone (FLONASE) 50 MCG/ACT nasal spray Place 2 sprays into both nostrils daily. 16 g 6   glucose blood (ACCU-CHEK GUIDE) test strip Use to check blood sugar TID. E11.69 100 each 3   insulin aspart (NOVOLOG) 100 UNIT/ML injection Inject 2-10 Units into the skin 3 (three) times daily before meals. Per sliding scale     lidocaine (XYLOCAINE) 5 % ointment Apply 1 application. topically as needed for moderate pain. First choice is lidocaine patch, but if not covered/expensive, please fill ointment (Patient taking differently: Apply 1 application. topically 2 (two) times daily as needed for moderate pain.) 35.44 g 3   loratadine (CLARITIN) 10 MG tablet Take 1 tablet (10 mg total) by mouth daily. 30 tablet 11   metolazone (ZAROXOLYN) 2.5 MG tablet Take 2.5 mg by mouth 3 (three) times a week. Monday Wednesday and Friday.     metoprolol succinate (TOPROL-XL) 25 MG 24 hr tablet Take 1 tablet (25 mg total) by mouth in the morning and at bedtime. 60 tablet 3   Multiple Vitamins-Minerals (MULTIVITAMIN WOMEN PO) Take 1 tablet by mouth daily.     nitroGLYCERIN (NITROSTAT) 0.4 MG SL tablet Place 1 tablet (0.4 mg total) under the tongue every 5 (five) minutes x 3 doses as needed for chest pain. 25 tablet 12   pantoprazole (PROTONIX) 40 MG tablet TAKE ONE TABLET BY MOUTH DAILY AT 9AM 90 tablet 0   potassium chloride SA (KLOR-CON M) 20 MEQ tablet Take 2 tablets (40 mEq total) by mouth daily. Take an additional 20 meq with every dose of METOLAZONE 180 tablet 3   RESTASIS 0.05 % ophthalmic emulsion Place 1 drop into both eyes 2 (two) times daily.  12    rosuvastatin (CRESTOR) 10 MG tablet Take 1 tablet (10 mg total) by mouth every evening.     torsemide (DEMADEX) 20 MG tablet Take 3 tablets (60 mg total) by mouth 2 (two) times daily. 180 tablet 0   No current facility-administered medications for this encounter.   Vitals:   01/26/22 1111  BP: 110/80  Pulse: 91  SpO2: 95%  Weight: 132.2 kg (291 lb 6.4 oz)     Wt Readings from Last 3 Encounters:  01/26/22 132.2 kg (291 lb 6.4 oz)  01/25/22 131.8 kg (290 lb 9.6 oz)  01/23/22 135.4 kg (298 lb 8.1 oz)     PHYSICAL EXAM: General:  No distress. Arrived in wheelchair. HEENT: normal Neck: supple. no JVD. Carotids 2+ bilat; no bruits.  Cor: PMI nondisplaced. Irregular rate & rhythm. No rubs, gallops, 2/6 TR Lungs: clear Abdomen: obese, soft, nontender, nondistended.  Extremities: no cyanosis, clubbing, rash, trace edema Neuro: alert & orientedx3, cranial nerves grossly intact. moves all 4 extremities w/o difficulty. Affect pleasant   EKG: Atrial flutter 89 bpm.   ECG reviewed with Dr. Aundra Dubin.   ASSESSMENT & PLAN:  1. Acute on chronic Biventricular Systolic Heart Failure: - RHC (6/22) with low filling pressures with moderately reduced CO. - Echo (7/22) in setting of AFL with RVR, showed EF 30-35%, moderate LVH,  moderate RV dysfunction, PASP 48, s/p MV repair with mild MR and mean gradient 10 mmHg with HR around 130, s/p TV repair with mild-moderate TR, dilated IVC.   - RHC 4/23 markedly elevated biventricular pressures - Echo 4/23 35-40% RV moderately down severe TR. Moderate Leslie mean grad 74mm HG - TEE 06/02: Severe biventricular dysfunction, trivial MR, mean gradient 3 across MV prosthesis, severe TR - Multiple admissions recently for HF mostly occur in setting of recurrent AF. Currently in NSR. - NYHA IIIb/IV. Volume appears stable, looks and feels the best she has in a while. Continues on Torsemide 60 BID + 2.5 mg metolazone M, W, F. BMP, BNP today. - In and out of AFL at recent  visits. AFL today (see below) - GDMT limited by AKI on CKD. No BP room for bidil. - Continue toprol xl 25 mg bid - Worry may have end-stage disease. At some point, HD may be only way to manage volume status.   2. PAF/AFL - She has failed Tikosyn. - She had Maze in 5/22 and DCCV in 6/22 & 04/19/21, last of which she had ERAF. - Multiple admissions recently for HF mostly occur in setting of recurrent AF. Last DCCV 11/24/21 - AFL at f/u 05/01, back in SR at f/u 05/16. Back in AFL today. She has been in and out of atrial flutter recently. She will return to clinic tomorrow for ECG. If remains in AFL, will plan to arrange DCCV. - Continue amiodarone + Eliquis  - Unable to tolerate AF at all. Follows with EP. Not a candidate for Afib ablation or AV nodal ablation/PPM - Has OSA, awaiting split night study for CPAP titration.    3. CKD Stage IV   - New baseline creatinine  ~ 2.6 - 2.8. Stabilized around 3.1 during recent admit - No SGLT2i, GFR has been consistently less than 20 - Follows with Nephrology - Labs today   4. CAD - Cath (2019): Mild nonobstructive CAD -  No chest pain.  - Continue statin + beta blocker - no ASA w/ Eliquis    5. Valvular Heart Disease: - S/p MV and TV repair in 5/22.  - Echo 02/25/21 with mild-moderate TR.   - Echo 4/23 35-40% RV moderately down. severe TR. Moderate Leslie mean gradient 42mmHG  - TEE 06/23: severe biventricular dysfunction, trivial MR, mean gradient 3 across MV prosthesis, severe TR - Seen by Dr. Burt Knack yesterday. Consider TV VIV   6. Obesity - Body mass index is 46.75 kg/m. -Discussed portion control.    7. DMII - Hgb A1c 6.7% 04/23 - GFR has been too low for SGLT2i   8. OSA - initial sleep study positive but was awaiting split night study to titrate CPAP - Has been referred to Dr. Radford Pax - suspect this is part of her AF recurrence   9. PVCs - Single PVC on ECG today - Already on PO amiodarone for AF/AFL      Follow-up: Needs close  f/u. ECG tomorrow. F/u with Dr. Harrell Gave (primary Cardiologist 07/07), APP clinic 08/03.  [name] N PA-C 11:51 AM

## 2022-01-25 NOTE — Telephone Encounter (Signed)
Called and left patient a message on her voice mail to confirm/remind patient of their appointment at the [facility] on 01/26/22.   Patient was also reminded to bring all medications and/or complete list.

## 2022-01-25 NOTE — Progress Notes (Signed)
Cardiology Office Note:    Date:  01/28/2022   ID:  [name], DOB November 01, 1955, MRN [id number]  PCP:  [name], NP   [facility] Providers Cardiologist:  Buford Dresser, MD Electrophysiologist:  Vickie Epley, MD     Referring MD: [name], NP   Chief Complaint  Patient presents with   Shortness of Breath    History of Present Illness:    [name] is a 66 y.o. female referred for evaluation of severe tricuspid regurgitation.  The patient has a very complex cardiac history.  She has a nonischemic cardiomyopathy with biventricular heart failure.  Comorbid conditions include morbid obesity, atrial fibrillation, hypertension, diabetes, and valvular heart disease.  She underwent mitral and tricuspid valve repair and maze procedure in May 2022.  She has had many hospitalizations since that time for continued problems with atrial fibrillation and congestive heart failure.  She has now developed progressive kidney disease and has stage IV CKD.  She is now treated in the outpatient setting with a combination of torsemide and metolazone.  During her recent hospitalization, she underwent right heart catheterization demonstrating severely elevated RA pressure of 29 mmHg, severe pulmonary hypertension with a mean PA pressure 45 mmHg, severely elevated wedge pressure of 30 mmHg with V waves to 45 mmHg, and preserved cardiac output.  Transesophageal echo demonstrated severe biventricular dysfunction with very severe tricuspid regurgitation.  The tricuspid valve leaflets appear restricted.  The patient continues to struggle symptomatically with shortness of breath and fatigue.  She is barely able to do her activities of daily living.  She cannot walk any significant distance at all.  She has chronic edema that is been somewhat improved since her most recent hospitalization.  Also admits to chronic orthopnea.  Past Medical History:  Diagnosis Date   Acute on  chronic systolic CHF (congestive heart failure) (HCC) 12/21/2020   Allergic rhinitis    Arthritis    Asthma    Chronic diastolic CHF (congestive heart failure) (HCC)    COPD (chronic obstructive pulmonary disease) (HCC)    Depression    DM (diabetes mellitus) ([place])    DVT (deep vein thrombosis) in pregnancy    GERD (gastroesophageal reflux disease)    HTN (hypertension)    Hyperlipidemia    Obesity    Pneumonia 04/2018   RIGHT LOBE   Sleep apnea    compliant with CPAP    Past Surgical History:  Procedure Laterality Date   ABDOMINAL HYSTERECTOMY     BUBBLE STUDY  11/26/2020   Procedure: BUBBLE STUDY;  Surgeon: Buford Dresser, MD;  Location: [place];  Service: Cardiovascular;;   CARDIOVERSION N/A 05/06/2020   Procedure: CARDIOVERSION;  Surgeon: Buford Dresser, MD;  Location: [facility] ENDOSCOPY;  Service: Cardiovascular;  Laterality: N/A;   CARDIOVERSION N/A 11/04/2020   Procedure: CARDIOVERSION;  Surgeon: Lelon Perla, MD;  Location: [place];  Service: Cardiovascular;  Laterality: N/A;   CARDIOVERSION N/A 02/01/2021   Procedure: CARDIOVERSION;  Surgeon: Jolaine Artist, MD;  Location: [place];  Service: Cardiovascular;  Laterality: N/A;   CARDIOVERSION N/A 02/09/2021   Procedure: CARDIOVERSION;  Surgeon: Jolaine Artist, MD;  Location: [facility] ENDOSCOPY;  Service: Cardiovascular;  Laterality: N/A;   CARDIOVERSION N/A 04/19/2021   Procedure: CARDIOVERSION;  Surgeon: Fay Records, MD;  Location: [place];  Service: Cardiovascular;  Laterality: N/A;   CARDIOVERSION N/A 11/25/2021   Procedure: CARDIOVERSION;  Surgeon: Jolaine Artist, MD;  Location: [facility] ENDOSCOPY;  Service: Cardiovascular;  Laterality: N/A;   CATARACT EXTRACTION, BILATERAL     CHOLECYSTECTOMY     COLONOSCOPY WITH PROPOFOL N/A 03/05/2015   Procedure: COLONOSCOPY WITH PROPOFOL;  Surgeon: Carol Ada, MD;  Location: [facility] ENDOSCOPY;  Service: Endoscopy;  Laterality: N/A;   DIAGNOSTIC  LAPAROSCOPY     ingrown hallux Left    KNEE SURGERY     LEFT HEART CATH AND CORONARY ANGIOGRAPHY N/A 12/31/2017   Procedure: LEFT HEART CATH AND CORONARY ANGIOGRAPHY;  Surgeon: Charolette Forward, MD;  Location: [place] CV LAB;  Service: Cardiovascular;  Laterality: N/A;   MAZE N/A 12/29/2020   Procedure: MAZE;  Surgeon: [name], MD;  Location: [place];  Service: Open Heart Surgery;  Laterality: N/A;   MITRAL VALVE REPAIR N/A 12/29/2020   Procedure: MITRAL VALVE REPAIR USING CARBOMEDICS ANNULOFLEX RING SIZE 30;  Surgeon: [name], MD;  Location: [place];  Service: Open Heart Surgery;  Laterality: N/A;   RIGHT HEART CATH N/A 12/22/2020   Procedure: RIGHT HEART CATH;  Surgeon: Larey Dresser, MD;  Location: [place] CV LAB;  Service: Cardiovascular;  Laterality: N/A;   RIGHT HEART CATH N/A 01/31/2021   Procedure: RIGHT HEART CATH;  Surgeon: Jolaine Artist, MD;  Location: [place] CV LAB;  Service: Cardiovascular;  Laterality: N/A;   RIGHT HEART CATH N/A 07/29/2021   Procedure: RIGHT HEART CATH;  Surgeon: Jolaine Artist, MD;  Location: [place] CV LAB;  Service: Cardiovascular;  Laterality: N/A;   RIGHT HEART CATH N/A 11/24/2021   Procedure: RIGHT HEART CATH;  Surgeon: Jolaine Artist, MD;  Location: [place] CV LAB;  Service: Cardiovascular;  Laterality: N/A;   TEE WITHOUT CARDIOVERSION N/A 11/26/2020   Procedure: TRANSESOPHAGEAL ECHOCARDIOGRAM (TEE);  Surgeon: Buford Dresser, MD;  Location: [facility] ENDOSCOPY;  Service: Cardiovascular;  Laterality: N/A;   TEE WITHOUT CARDIOVERSION N/A 12/29/2020   Procedure: TRANSESOPHAGEAL ECHOCARDIOGRAM (TEE);  Surgeon: [name], MD;  Location: [place];  Service: Open Heart Surgery;  Laterality: N/A;   TEE WITHOUT CARDIOVERSION N/A 01/20/2022   Procedure: TRANSESOPHAGEAL ECHOCARDIOGRAM (TEE);  Surgeon: Jolaine Artist, MD;  Location: [place];  Service: Cardiovascular;  Laterality: N/A;   TRICUSPID  VALVE REPLACEMENT N/A 12/29/2020   Procedure: 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 RING SIZE 34;  Surgeon: [name], MD;  Location: [place];  Service: Open Heart Surgery;  Laterality: N/A;    Current Medications: Current Meds  Medication Sig   acetaminophen (TYLENOL) 325 MG tablet Take 2 tablets (650 mg total) by mouth every 6 (six) hours as needed for mild pain or moderate pain.   albuterol (VENTOLIN HFA) 108 (90 Base) MCG/ACT inhaler Inhale 1-2 puffs into the lungs every 6 (six) hours as needed for wheezing or shortness of breath.   Alcohol Swabs (B-D SINGLE USE SWABS REGULAR) PADS 1 each by Other route daily.   Alcohol Swabs PADS 1 application by Does not apply route in the morning, at noon, and at bedtime.   amiodarone (PACERONE) 200 MG tablet Take 1 tablet (200 mg total) by mouth daily.   apixaban (ELIQUIS) 5 MG TABS tablet Take 1 tablet (5 mg total) by mouth 2 (two) times daily.   blood glucose meter kit and supplies KIT Dispense based on patient and insurance preference. Use up to four times daily as directed.   blood glucose meter kit and supplies Dispense based on patient and insurance preference. Use up to four times daily as directed. (FOR ICD-10 E10.9, E11.9).  Blood Glucose Monitoring Suppl (ACCU-CHEK GUIDE ME) w/Device KIT Use as instructed to check blood sugar three times daily. E11.69   busPIRone (BUSPAR) 5 MG tablet Take 1 tablet (5 mg total) by mouth 2 (two) times daily.   colchicine 0.6 MG tablet Take 0.5 tablets (0.3 mg total) by mouth daily.   diphenhydrAMINE HCl (BENADRYL PO) Take 1 tablet by mouth daily as needed (itchyness).   diphenhydrAMINE-Zinc Acetate (BENADRYL ITCH RELIEF EX) Apply 1 application. topically daily as needed (itchyness).   docusate sodium (COLACE) 100 MG capsule Take 100 mg by mouth 2 (two) times daily.   Dulaglutide (TRULICITY) 1.5 JS/9.7WY SOPN Inject 1.5 mg into the skin every Wednesday.   DULoxetine (CYMBALTA) 60 MG capsule  Take 1 capsule (60 mg total) by mouth daily.   EPINEPHrine 0.3 mg/0.3 mL IJ SOAJ injection Inject 0.3 mg into the muscle as needed for anaphylaxis.   fluticasone (FLONASE) 50 MCG/ACT nasal spray Place 2 sprays into both nostrils daily.   glucose blood (ACCU-CHEK GUIDE) test strip Use to check blood sugar TID. E11.69   insulin aspart (NOVOLOG) 100 UNIT/ML injection Inject 2-10 Units into the skin 3 (three) times daily before meals. Per sliding scale   lidocaine (XYLOCAINE) 5 % ointment Apply 1 application. topically as needed for moderate pain. First choice is lidocaine patch, but if not covered/expensive, please fill ointment (Patient taking differently: Apply 1 application. topically 2 (two) times daily as needed for moderate pain.)   loratadine (CLARITIN) 10 MG tablet Take 1 tablet (10 mg total) by mouth daily.   metolazone (ZAROXOLYN) 2.5 MG tablet Take 2.5 mg by mouth 3 (three) times a week. Monday Wednesday and Friday.   metoprolol succinate (TOPROL-XL) 25 MG 24 hr tablet Take 1 tablet (25 mg total) by mouth in the morning and at bedtime.   Multiple Vitamins-Minerals (MULTIVITAMIN WOMEN PO) Take 1 tablet by mouth daily.   nitroGLYCERIN (NITROSTAT) 0.4 MG SL tablet Place 1 tablet (0.4 mg total) under the tongue every 5 (five) minutes x 3 doses as needed for chest pain.   pantoprazole (PROTONIX) 40 MG tablet TAKE ONE TABLET BY MOUTH DAILY AT 9AM   RESTASIS 0.05 % ophthalmic emulsion Place 1 drop into both eyes 2 (two) times daily.   rosuvastatin (CRESTOR) 10 MG tablet Take 1 tablet (10 mg total) by mouth every evening.   torsemide (DEMADEX) 20 MG tablet Take 3 tablets (60 mg total) by mouth 2 (two) times daily.   [DISCONTINUED] potassium chloride SA (KLOR-CON M) 20 MEQ tablet Take 2 tablets (40 mEq total) by mouth daily. Take an additional 20 meq with every dose of METOLAZONE     Allergies:   Bee pollen and Sulfa antibiotics   Social History   Socioeconomic History   Marital status:  Significant Other    Spouse name: Not on file   Number of children: 2   Years of education: Not on file   Highest education level: Some college, no degree  Occupational History   Not on file  Tobacco Use   Smoking status: Never   Smokeless tobacco: Never  Vaping Use   Vaping Use: Never used  Substance and Sexual Activity   Alcohol use: No   Drug use: No   Sexual activity: Not Currently  Other Topics Concern   Not on file  Social History Narrative   Pt lives in [place] with spouse.  2 grown children.   Previously worked in [employer] at [employer].  Now on disability  Social Determinants of Health   Financial Resource Strain: Low Risk  (01/19/2022)   Overall Financial Resource Strain (CARDIA)    Difficulty of Paying Living Expenses: Not hard at all  Food Insecurity: No Food Insecurity (01/19/2022)   Hunger Vital Sign    Worried About Running Out of Food in the Last Year: Never true    Ran Out of Food in the Last Year: Never true  Transportation Needs: No Transportation Needs (01/19/2022)   PRAPARE - Administrator, Civil Service (Medical): No    Lack of Transportation (Non-Medical): No  Physical Activity: Insufficiently Active (10/28/2021)   Exercise Vital Sign    Days of Exercise per Week: 2 days    Minutes of Exercise per Session: 70 min  Stress: No Stress Concern Present (10/28/2021)   [employer] of Occupational Health - Occupational Stress Questionnaire    Feeling of Stress : Not at all  Social Connections: Socially Isolated (10/28/2021)   Social Connection and Isolation Panel [NHANES]    Frequency of Communication with Friends and Family: More than three times a week    Frequency of Social Gatherings with Friends and Family: Three times a week    Attends Religious Services: Never    Active Member of Clubs or Organizations: No    Attends Banker Meetings: Never    Marital Status: Widowed     Family History: The patient's family history  includes Breast cancer in her mother; CAD in an other family member; Cancer in her father and mother; Hypertension in her brother, mother, and sister.  ROS:   Please see the history of present illness.    All other systems reviewed and are negative.  EKGs/Labs/Other Studies Reviewed:    The following studies were reviewed today: Right heart catheterization 11/24/21: Findings:   RA = 29 RV = 65/30 PA = 67/37 (45) PCW = 30 (v waves to 45) Fick cardiac output/index = 6.2/2.5 PVR = 2.4 WU Ao sat = 93% PA sat = 50%, 55% PAPi = 1.0    Assessment: 1. Severely elevated biventricular pressures 2. Moderate pulmonary venous HTN with marked v-waves in PCWP tracing 3. Severe RV failure   Plan/Discussion:   Add milrinone. Continue diuresis.  TEE 01/20/22:  1. Left ventricular ejection fraction, by estimation, is 55%. The left  ventricle has severely decreased function. The left ventricle has no  regional wall motion abnormalities.   2. Right ventricular systolic function is severely reduced. The right  ventricular size is severely enlarged.   3. Left atrial size was severely dilated. No left atrial/left atrial  appendage thrombus was detected.   4. Right atrial size was severely dilated.   5. The mitral valve has been repaired/replaced. Trivial mitral valve  regurgitation. Mild mitral stenosis. The mean mitral valve gradient is 3.0  mmHg.   6. The RA and RV are severely dilated. The tricuspid valve leaflets do no  coapt. The tricuspid valve is has been repaired/replaced. The tricuspid  valve is status post repair with an annuloplasty ring. Tricuspid valve  regurgitation is severe.   7. The aortic valve is tricuspid. Aortic valve regurgitation is not  visualized.   Recent Labs: 11/22/2021: TSH 2.224 01/17/2022: ALT 15 01/23/2022: Hemoglobin 8.7; Magnesium 2.0; Platelets 197 01/26/2022: B Natriuretic Peptide 375.6; BUN 46; Creatinine, Ser 2.85; Potassium 3.3; Sodium 140  Recent Lipid  Panel    Component Value Date/Time   CHOL 158 05/03/2021 0603   CHOL 140 06/24/2020 1054  TRIG 83 05/03/2021 0603   HDL 50 05/03/2021 0603   HDL 46 06/24/2020 1054   CHOLHDL 3.2 05/03/2021 0603   VLDL 17 05/03/2021 0603   LDLCALC 91 05/03/2021 0603   LDLCALC 75 06/24/2020 1054         Physical Exam:    VS:  BP 132/62   Pulse 86   Ht $R'5\' 7"'ow$  (1.702 m)   Wt 290 lb 9.6 oz (131.8 kg)   SpO2 95%   BMI 45.51 kg/m     Wt Readings from Last 3 Encounters:  01/27/22 291 lb (132 kg)  01/26/22 291 lb 6.4 oz (132.2 kg)  01/25/22 290 lb 9.6 oz (131.8 kg)     GEN: Very pleasant, obese woman in no acute distress HEENT: Normal NECK: No JVD; No carotid bruits LYMPHATICS: No lymphadenopathy CARDIAC: RRR, 3/6 holosystolic murmur at the left lower sternal border RESPIRATORY:  Clear to auscultation without rales, wheezing or rhonchi  ABDOMEN: Soft, non-tender, non-distended MUSCULOSKELETAL: Trace bilateral pretibial edema; No deformity  SKIN: Warm and dry NEUROLOGIC:  Alert and oriented x 3 PSYCHIATRIC:  Normal affect   ASSESSMENT:    1. Prosthetic tricuspid valve failure    PLAN:    In order of problems listed above:  The patient has chronic biventricular heart failure and has developed severe tricuspid regurgitation with history of tricuspid valve repair and ring annuloplasty.  Her TEE is reviewed and demonstrates severe RV dysfunction and wide-open tricuspid regurgitation with restricted tricuspid valve leaflets and severe central TR.  She is here for evaluation of tricuspid replacement using valve in ring technique.  Literature is reviewed and this can be performed as a palliative treatment.  There is mixed experience with higher risks of valve failure because of increased risk of paravalvular regurgitation.  I discussed this complex patient's case with Dr. [name] who knows her well.  We have concerns about her degree of LV dysfunction and markedly elevated LV pressures.  This  would potentially increase her risk of tricuspid valve intervention significantly and she may not benefit as much from undergoing a right-sided procedure in the setting of her severe left heart disease.  The other consideration is to treat her heart failure/volume overload with hemodialysis and see how she tolerates this.  We will review her case with our multidisciplinary valve team before making a final recommendation.  I will also reach out to other structural operators to discuss their experiences there are limited case reports in the literature for tricuspid valve in ring.  Plans discussed at length with the patient and her husband today.  As part of her evaluation, I reviewed multiple studies including her surface echo's, transesophageal echo, extensive hospital records, heart failure notes, and cardiac catheterization data.           Medication Adjustments/Labs and Tests Ordered: Current medicines are reviewed at length with the patient today.  Concerns regarding medicines are outlined above.  No orders of the defined types were placed in this encounter.  No orders of the defined types were placed in this encounter.   Patient Instructions  Medication Instructions:  Your physician recommends that you continue on your current medications as directed. Please refer to the Current Medication list given to you today.  *If you need a refill on your cardiac medications before your next appointment, please call your pharmacy*   Lab Work: NONE If you have labs (blood work) drawn today and your tests are completely normal, you will receive your results only by:  MyChart Message (if you have MyChart) OR A paper copy in the mail If you have any lab test that is abnormal or we need to change your treatment, we will call you to review the results.   Testing/Procedures: NONE   Follow-Up: At [facility], you and your health needs are our priority.  As part of our continuing mission to  provide you with exceptional heart care, we have created designated Provider Care Teams.  These Care Teams include your primary Cardiologist (physician) and Advanced Practice Providers (APPs -  Physician Assistants and Nurse Practitioners) who all work together to provide you with the care you need, when you need it.  Your next appointment:   Structural Team will follow-up  Provider:   Sherren Mocha, MD    Important Information About Sugar         Signed, Sherren Mocha, MD  01/28/2022 2:38 PM    [place]

## 2022-01-25 NOTE — H&P (View-Only) (Signed)
Advanced Heart Failure Clinic Note    PCP: [name], NP PCP-Cardiologist: Buford Dresser, MD  [facility]: Dr. [name]   Reason for Visit: Heart Failure F/u  HPI: [name] is a 66 y.o. with a history of morbid obesity, chronic diastolic and systolic HF with mildly reduced LVEF (45-50%), severe MR/TR, atrial fibrillation on Eliquis, HTN, and DMII.    She has a known history of HF with mildly reduced LVEF of 45-50% with cath in 2019 with mild, non-obstructive CAD. Also with history of difficult to control Afib with failed Tikosyn, later switched to amiodarone, dig, metop and apixaban for [facility].    Admitted 5//22 with worsening HF.  LHC 5/19 non-obstructive CAD. RHC 12/22/20 with elevated filling pressures and low CI.    Underwent MV/TV repair with Maze on 12/30/20. Post-operatively, she struggled  with low output progressive renal failure and volume overload.  Post operative Echo showed LVEF 55% RV moderately HK. Moderate TR. No significant MR. D/c wt was 281 lb. SCr was 2.8. She was also in Afib day of d/c.    Admitted 6/22 for A/CHF and AF diuresed with IV lasix, RHC showed low filling pressures and moderately reduced CO. She underwent successful DCCV 02/01/21. 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    Seen in ED 02/07/21 fShe was back in Afib, rate controlled. She was loaded w amiodarone gtt, successful DCCV on 02/09/21.    Back in AF 7/22. Repeat echo showed new reduction in EF, in setting of AFL with RVR,  EF 30-35%, moderate LVH, moderate RV dysfunction. Transitioned to po amio and torsemide, discharge weight 271 lbs.   S/p DCCV 04/19/21, but back in afib at follow up a week later at [facility] appointment.   Admitted 9/22 with CP and AKI on CKD IV. SCr 3.12 in ED, likely due to over diuresis. discharge, weight 268 lbs.  Admitted 1/23 x 2  with respiratory distress due to HF and COVID, Had recurrent AF. Echo EF 45%  Admitted 4/23 with recurrent HF in setting of recurrent  AF. Echo 35-40% RV moderately down severe TR. Sebastian 11/24/21 notable for severely elevated biventricular pressures. Started on milrinone and IV lasix.   Underwent DC-CV to NSR on 11/25/21. Loaded with IV amio. Developed severe stuttering. Code Stroke called. CT/MRI normal. Resolved spontaneously. Developed AKI. Discharged home 12/07/21. Scr 2.9. Wt 295   RA = 29 RV = 65/30 PA = 67/37 (45) PCW = 30 (v waves to 45) Fick cardiac output/index = 6.2/2.5 PVR = 2.4 WU Ao sat = 93% PA sat = 50%, 55% PAPi = 1.0   Readmitted 05/30-06/05/23 with a/c CHF after failing escalation of diuretics in outpatient setting. Initially required lasix gtt which was later transitioned to PO torsemide 60 BID + 2.5 mg metolazone M, W, F. TEE during admit with severe biventricular dysfunction, stable MV prosthesis and severe TR. At discharge she was referred to structural heart team for consideration of TV VIV.  She saw Dr. Burt Knack yesterday, she is hoping she will be candidate for TV VIV.  Here today for hospital follow-up. Reports less dyspnea and leg edema than she's had in a while. No CP. She denies palpitations. Home weight since discharge 289-291 lb. She does feel a little off, can tell she's back in atrial flutter. Taking all medications as prescribed. Watching fluid and sodium intake.   Review of systems complete and found to be negative unless listed in HPI.    Cardiac Studies - Echo (11/13): EF 60-65%,  moderate TR - Echo (11/14): EF 60-65%, severe TR, PA peak pressure 47 mmHg - Echo (5/15): EF 50-55% - Exercise stress test (7/6): A. Fib. with controlled vent. response OLD inferior MI and non sp. T wave changes at baseline. - Echo (8/17): EF 50-55%, moderate MR - Exercise stress test (8/17): Atrial Fib. Q wave in lead 3 and AVF with nonsp T wave changes at baseline - Echo (5/19): EF 55-60%, moderate MR - Exercise stress test (5/19): Afib, normal. - LHC (5/19): Prox LAD lesion is 10% stenosed, Dist LM to Ost  LAD lesion is 10% stenosed, Ost Cx lesion is 15% stenosed, LV systolic function normal, LVEDP is normal. - Echo (11/19): EF 45-50%, severe MR/TR - Echo (11/21): EF 45-50%, severe MR (rheumatic)/TR - Echo (4/22): EF 45-50%, severe MR mean gradient 2.0 mmHg, severe TR - Echo (5/22): EF 60-65%, s/p MVR, trace MR mean gradient 7.0 mmHg, moderate TR - RHC (5/22): Elevated R > L heart filling pressures with PAPi low but not markedly so (1.8) suggestive of a significant component of RV dysfunction. Prominent v-waves in PCWP and RA pressure tracings suggestive of significant MR and TR. Cardiac index low. - RHC (6/22): low filling pressures, moderate reduced CO. - Echo (7/22): EF 30-35%, moderate LVH, moderate RV dysfunction, PASP 48, s/p MV repair with mild MR and mean gradient 10 mmHg with HR around 130, s/p TV repair with mild-moderate TR, dilated IVC.   RHC 01/31/21  RA = 5 RV = 33/2 PA = 35/13 (22) PCW = 13 Fick cardiac output/index = 4.3/2.0 PVR = 2.1 WU FA sat = 97% PA sat = 54%, 54% - DCCV (02/01/21): successful conversion to NSR. - DCCV (02/09/21): successful conversion to NSR. -DCCV (830/22): successful conversion to NSR.  ROS: All systems negative except as listed in HPI, PMH and Problem List.  SH:  Social History   Socioeconomic History   Marital status: Significant Other    Spouse name: Not on file   Number of children: 2   Years of education: Not on file   Highest education level: Some college, no degree  Occupational History   Not on file  Tobacco Use   Smoking status: Never   Smokeless tobacco: Never  Vaping Use   Vaping Use: Never used  Substance and Sexual Activity   Alcohol use: No   Drug use: No   Sexual activity: Not Currently  Other Topics Concern   Not on file  Social History Narrative   Pt lives in [place] with spouse.  2 grown children.   Previously worked in [employer] at [employer].  Now on disability   Social Determinants of Health   Financial Resource  Strain: Low Risk  (01/19/2022)   Overall Financial Resource Strain (CARDIA)    Difficulty of Paying Living Expenses: Not hard at all  Food Insecurity: No Food Insecurity (01/19/2022)   Hunger Vital Sign    Worried About Running Out of Food in the Last Year: Never true    Ran Out of Food in the Last Year: Never true  Transportation Needs: No Transportation Needs (01/19/2022)   PRAPARE - Hydrologist (Medical): No    Lack of Transportation (Non-Medical): No  Physical Activity: Insufficiently Active (10/28/2021)   Exercise Vital Sign    Days of Exercise per Week: 2 days    Minutes of Exercise per Session: 70 min  Stress: No Stress Concern Present (10/28/2021)   [place]  Stress Questionnaire    Feeling of Stress : Not at all  Social Connections: Socially Isolated (10/28/2021)   Social Connection and Isolation Panel [NHANES]    Frequency of Communication with Friends and Family: More than three times a week    Frequency of Social Gatherings with Friends and Family: Three times a week    Attends Religious Services: Never    Active Member of Clubs or Organizations: No    Attends Archivist Meetings: Never    Marital Status: Widowed  Intimate Partner Violence: Not At Risk (10/28/2021)   Humiliation, Afraid, Rape, and Kick questionnaire    Fear of Current or Ex-Partner: No    Emotionally Abused: No    Physically Abused: No    Sexually Abused: No   FH:  Family History  Problem Relation Age of Onset   Breast cancer Mother    Cancer Mother    Hypertension Mother    Cancer Father    Hypertension Sister    Hypertension Brother    CAD Other    Past Medical History:  Diagnosis Date   Acute on chronic systolic CHF (congestive heart failure) ([place]) 12/21/2020   Allergic rhinitis    Arthritis    Asthma    Chronic diastolic CHF (congestive heart failure) (HCC)    COPD (chronic obstructive pulmonary disease) (HCC)     Depression    DM (diabetes mellitus) ([place])    DVT (deep vein thrombosis) in pregnancy    GERD (gastroesophageal reflux disease)    HTN (hypertension)    Hyperlipidemia    Obesity    Pneumonia 04/2018   RIGHT LOBE   Sleep apnea    compliant with CPAP   Current Outpatient Medications  Medication Sig Dispense Refill   acetaminophen (TYLENOL) 325 MG tablet Take 2 tablets (650 mg total) by mouth every 6 (six) hours as needed for mild pain or moderate pain.     albuterol (VENTOLIN HFA) 108 (90 Base) MCG/ACT inhaler Inhale 1-2 puffs into the lungs every 6 (six) hours as needed for wheezing or shortness of breath. 18 g 1   Alcohol Swabs (B-D SINGLE USE SWABS REGULAR) PADS 1 each by Other route daily. 100 each 11   Alcohol Swabs PADS 1 application by Does not apply route in the morning, at noon, and at bedtime. 120 each 11   amiodarone (PACERONE) 200 MG tablet Take 1 tablet (200 mg total) by mouth daily. 90 tablet 3   apixaban (ELIQUIS) 5 MG TABS tablet Take 1 tablet (5 mg total) by mouth 2 (two) times daily. 180 tablet 3   blood glucose meter kit and supplies KIT Dispense based on patient and insurance preference. Use up to four times daily as directed. 1 each 0   blood glucose meter kit and supplies Dispense based on patient and insurance preference. Use up to four times daily as directed. (FOR ICD-10 E10.9, E11.9). 1 each 0   Blood Glucose Monitoring Suppl (ACCU-CHEK GUIDE ME) w/Device KIT Use as instructed to check blood sugar three times daily. E11.69 1 kit 0   busPIRone (BUSPAR) 5 MG tablet Take 1 tablet (5 mg total) by mouth 2 (two) times daily. 180 tablet 1   colchicine 0.6 MG tablet Take 0.5 tablets (0.3 mg total) by mouth daily. 45 tablet 0   diphenhydrAMINE HCl (BENADRYL PO) Take 1 tablet by mouth daily as needed (itchyness).     diphenhydrAMINE-Zinc Acetate (BENADRYL ITCH RELIEF EX) Apply 1 application. topically daily as needed (itchyness).  docusate sodium (COLACE) 100 MG  capsule Take 100 mg by mouth 2 (two) times daily.     Dulaglutide (TRULICITY) 1.5 MK/3.4JZ SOPN Inject 1.5 mg into the skin every Wednesday. 0.5 mL 4   DULoxetine (CYMBALTA) 60 MG capsule Take 1 capsule (60 mg total) by mouth daily. 90 capsule 3   EPINEPHrine 0.3 mg/0.3 mL IJ SOAJ injection Inject 0.3 mg into the muscle as needed for anaphylaxis. 1 each 1   fluticasone (FLONASE) 50 MCG/ACT nasal spray Place 2 sprays into both nostrils daily. 16 g 6   glucose blood (ACCU-CHEK GUIDE) test strip Use to check blood sugar TID. E11.69 100 each 3   insulin aspart (NOVOLOG) 100 UNIT/ML injection Inject 2-10 Units into the skin 3 (three) times daily before meals. Per sliding scale     lidocaine (XYLOCAINE) 5 % ointment Apply 1 application. topically as needed for moderate pain. First choice is lidocaine patch, but if not covered/expensive, please fill ointment (Patient taking differently: Apply 1 application. topically 2 (two) times daily as needed for moderate pain.) 35.44 g 3   loratadine (CLARITIN) 10 MG tablet Take 1 tablet (10 mg total) by mouth daily. 30 tablet 11   metolazone (ZAROXOLYN) 2.5 MG tablet Take 2.5 mg by mouth 3 (three) times a week. Monday Wednesday and Friday.     metoprolol succinate (TOPROL-XL) 25 MG 24 hr tablet Take 1 tablet (25 mg total) by mouth in the morning and at bedtime. 60 tablet 3   Multiple Vitamins-Minerals (MULTIVITAMIN WOMEN PO) Take 1 tablet by mouth daily.     nitroGLYCERIN (NITROSTAT) 0.4 MG SL tablet Place 1 tablet (0.4 mg total) under the tongue every 5 (five) minutes x 3 doses as needed for chest pain. 25 tablet 12   pantoprazole (PROTONIX) 40 MG tablet TAKE ONE TABLET BY MOUTH DAILY AT 9AM 90 tablet 0   potassium chloride SA (KLOR-CON M) 20 MEQ tablet Take 2 tablets (40 mEq total) by mouth daily. Take an additional 20 meq with every dose of METOLAZONE 180 tablet 3   RESTASIS 0.05 % ophthalmic emulsion Place 1 drop into both eyes 2 (two) times daily.  12    rosuvastatin (CRESTOR) 10 MG tablet Take 1 tablet (10 mg total) by mouth every evening.     torsemide (DEMADEX) 20 MG tablet Take 3 tablets (60 mg total) by mouth 2 (two) times daily. 180 tablet 0   No current facility-administered medications for this encounter.   Vitals:   01/26/22 1111  BP: 110/80  Pulse: 91  SpO2: 95%  Weight: 132.2 kg (291 lb 6.4 oz)     Wt Readings from Last 3 Encounters:  01/26/22 132.2 kg (291 lb 6.4 oz)  01/25/22 131.8 kg (290 lb 9.6 oz)  01/23/22 135.4 kg (298 lb 8.1 oz)     PHYSICAL EXAM: General:  No distress. Arrived in wheelchair. HEENT: normal Neck: supple. no JVD. Carotids 2+ bilat; no bruits.  Cor: PMI nondisplaced. Irregular rate & rhythm. No rubs, gallops, 2/6 TR Lungs: clear Abdomen: obese, soft, nontender, nondistended.  Extremities: no cyanosis, clubbing, rash, trace edema Neuro: alert & orientedx3, cranial nerves grossly intact. moves all 4 extremities w/o difficulty. Affect pleasant   EKG: Atrial flutter 89 bpm.   ECG reviewed with Dr. Aundra Dubin.   ASSESSMENT & PLAN:  1. Acute on chronic Biventricular Systolic Heart Failure: - RHC (6/22) with low filling pressures with moderately reduced CO. - Echo (7/22) in setting of AFL with RVR, showed EF 30-35%, moderate LVH,  moderate RV dysfunction, PASP 48, s/p MV repair with mild MR and mean gradient 10 mmHg with HR around 130, s/p TV repair with mild-moderate TR, dilated IVC.   - RHC 4/23 markedly elevated biventricular pressures - Echo 4/23 35-40% RV moderately down severe TR. Moderate Leslie mean grad 45mm HG - TEE 06/02: Severe biventricular dysfunction, trivial MR, mean gradient 3 across MV prosthesis, severe TR - Multiple admissions recently for HF mostly occur in setting of recurrent AF. Currently in NSR. - NYHA IIIb/IV. Volume appears stable, looks and feels the best she has in a while. Continues on Torsemide 60 BID + 2.5 mg metolazone M, W, F. BMP, BNP today. - In and out of AFL at recent  visits. AFL today (see below) - GDMT limited by AKI on CKD. No BP room for bidil. - Continue toprol xl 25 mg bid - Worry may have end-stage disease. At some point, HD may be only way to manage volume status.   2. PAF/AFL - She has failed Tikosyn. - She had Maze in 5/22 and DCCV in 6/22 & 04/19/21, last of which she had ERAF. - Multiple admissions recently for HF mostly occur in setting of recurrent AF. Last DCCV 11/24/21 - AFL at f/u 05/01, back in SR at f/u 05/16. Back in AFL today. She has been in and out of atrial flutter recently. She will return to clinic tomorrow for ECG. If remains in AFL, will plan to arrange DCCV. - Continue amiodarone + Eliquis  - Unable to tolerate AF at all. Follows with EP. Not a candidate for Afib ablation or AV nodal ablation/PPM - Has OSA, awaiting split night study for CPAP titration.    3. CKD Stage IV   - New baseline creatinine  ~ 2.6 - 2.8. Stabilized around 3.1 during recent admit - No SGLT2i, GFR has been consistently less than 20 - Follows with Nephrology - Labs today   4. CAD - Cath (2019): Mild nonobstructive CAD -  No chest pain.  - Continue statin + beta blocker - no ASA w/ Eliquis    5. Valvular Heart Disease: - S/p MV and TV repair in 5/22.  - Echo 02/25/21 with mild-moderate TR.   - Echo 4/23 35-40% RV moderately down. severe TR. Moderate Leslie mean gradient 42mmHG  - TEE 06/23: severe biventricular dysfunction, trivial MR, mean gradient 3 across MV prosthesis, severe TR - Seen by Dr. Burt Knack yesterday. Consider TV VIV   6. Obesity - Body mass index is 46.75 kg/m. -Discussed portion control.    7. DMII - Hgb A1c 6.7% 04/23 - GFR has been too low for SGLT2i   8. OSA - initial sleep study positive but was awaiting split night study to titrate CPAP - Has been referred to Dr. Radford Pax - suspect this is part of her AF recurrence   9. PVCs - Single PVC on ECG today - Already on PO amiodarone for AF/AFL      Follow-up: Needs close  f/u. ECG tomorrow. F/u with Dr. Harrell Gave (primary Cardiologist 07/07), APP clinic 08/03.  Tatsuya Okray N PA-C 11:51 AM

## 2022-01-25 NOTE — Progress Notes (Signed)
Pre Surgical Assessment: 5 M Walk Test  74M=16.108f  5 Meter Walk Test- trial 1: 10.61 seconds 5 Meter Walk Test- trial 2: 12.12 seconds 5 Meter Walk Test- trial 3: 11.40 seconds 5 Meter Walk Test Average: 11.38 seconds   ADDENDUM: Patient's case was reviewed with the multidisciplinary heart valve team and also discussed with colleagues at a tertiary center.  After reviewing her hemodynamic findings with significant left heart failure and severely elevated left heart pressures, we feel like she is not a suitable candidate for valve in valve tricuspid replacement.  I called the patient and spoke with her about our recommendations - she expresses clear understanding.  She will continue with medical therapy under the care of Dr. BHaroldine Laws  MSherren Mocha6/13/2023 4:24 PM

## 2022-01-25 NOTE — Patient Instructions (Signed)
Medication Instructions:  Your physician recommends that you continue on your current medications as directed. Please refer to the Current Medication list given to you today.  *If you need a refill on your cardiac medications before your next appointment, please call your pharmacy*   Lab Work: NONE If you have labs (blood work) drawn today and your tests are completely normal, you will receive your results only by: [place] (if you have MyChart) OR A paper copy in the mail If you have any lab test that is abnormal or we need to change your treatment, we will call you to review the results.   Testing/Procedures: NONE   Follow-Up: At [facility], you and your health needs are our priority.  As part of our continuing mission to provide you with exceptional heart care, we have created designated Provider Care Teams.  These Care Teams include your primary Cardiologist (physician) and Advanced Practice Providers (APPs -  Physician Assistants and Nurse Practitioners) who all work together to provide you with the care you need, when you need it.  Your next appointment:   Structural Team will follow-up  Provider:   Sherren Mocha, MD    Important Information About [place]

## 2022-01-26 ENCOUNTER — Ambulatory Visit (HOSPITAL_COMMUNITY)
Admission: RE | Admit: 2022-01-26 | Discharge: 2022-01-26 | Disposition: A | Discharge status: Home / Self Care | Payer: 59 | Source: Ambulatory Visit | Attending: Physician Assistant | Admitting: Physician Assistant

## 2022-01-26 ENCOUNTER — Encounter (HOSPITAL_COMMUNITY): Payer: Self-pay

## 2022-01-26 ENCOUNTER — Telehealth: Payer: Self-pay

## 2022-01-26 VITALS — BP 110/80 | HR 91 | Wt 291.4 lb

## 2022-01-26 DIAGNOSIS — G4733 Obstructive sleep apnea (adult) (pediatric): Secondary | ICD-10-CM | POA: Diagnosis not present

## 2022-01-26 DIAGNOSIS — I071 Rheumatic tricuspid insufficiency: Secondary | ICD-10-CM | POA: Diagnosis not present

## 2022-01-26 DIAGNOSIS — I480 Paroxysmal atrial fibrillation: Secondary | ICD-10-CM | POA: Diagnosis not present

## 2022-01-26 DIAGNOSIS — E1122 Type 2 diabetes mellitus with diabetic chronic kidney disease: Secondary | ICD-10-CM | POA: Diagnosis not present

## 2022-01-26 DIAGNOSIS — I493 Ventricular premature depolarization: Secondary | ICD-10-CM | POA: Insufficient documentation

## 2022-01-26 DIAGNOSIS — I5023 Acute on chronic systolic (congestive) heart failure: Secondary | ICD-10-CM | POA: Diagnosis not present

## 2022-01-26 DIAGNOSIS — Z79899 Other long term (current) drug therapy: Secondary | ICD-10-CM | POA: Diagnosis not present

## 2022-01-26 DIAGNOSIS — N184 Chronic kidney disease, stage 4 (severe): Secondary | ICD-10-CM | POA: Insufficient documentation

## 2022-01-26 DIAGNOSIS — I50814 Right heart failure due to left heart failure: Secondary | ICD-10-CM

## 2022-01-26 DIAGNOSIS — Z8616 Personal history of COVID-19: Secondary | ICD-10-CM | POA: Diagnosis not present

## 2022-01-26 DIAGNOSIS — I2510 Atherosclerotic heart disease of native coronary artery without angina pectoris: Secondary | ICD-10-CM | POA: Diagnosis not present

## 2022-01-26 DIAGNOSIS — I38 Endocarditis, valve unspecified: Secondary | ICD-10-CM | POA: Diagnosis not present

## 2022-01-26 DIAGNOSIS — Z7901 Long term (current) use of anticoagulants: Secondary | ICD-10-CM | POA: Diagnosis not present

## 2022-01-26 DIAGNOSIS — Z952 Presence of prosthetic heart valve: Secondary | ICD-10-CM | POA: Insufficient documentation

## 2022-01-26 DIAGNOSIS — I5082 Biventricular heart failure: Secondary | ICD-10-CM | POA: Diagnosis not present

## 2022-01-26 DIAGNOSIS — I4892 Unspecified atrial flutter: Secondary | ICD-10-CM | POA: Insufficient documentation

## 2022-01-26 DIAGNOSIS — Z6841 Body Mass Index (BMI) 40.0 and over, adult: Secondary | ICD-10-CM | POA: Diagnosis not present

## 2022-01-26 LAB — BASIC METABOLIC PANEL
Anion gap: 10 (ref 5–15)
BUN: 46 mg/dL — ABNORMAL HIGH (ref 8–23)
CO2: 33 mmol/L — ABNORMAL HIGH (ref 22–32)
Calcium: 9.2 mg/dL (ref 8.9–10.3)
Chloride: 97 mmol/L — ABNORMAL LOW (ref 98–111)
Creatinine, Ser: 2.85 mg/dL — ABNORMAL HIGH (ref 0.44–1.00)
GFR, Estimated: 18 mL/min — ABNORMAL LOW (ref >60)
Glucose, Bld: 159 mg/dL — ABNORMAL HIGH (ref 70–99)
Potassium: 3.3 mmol/L — ABNORMAL LOW (ref 3.5–5.1)
Sodium: 140 mmol/L (ref 135–145)

## 2022-01-26 LAB — BRAIN NATRIURETIC PEPTIDE: B Natriuretic Peptide: 375.6 pg/mL — ABNORMAL HIGH (ref 0.0–100.0)

## 2022-01-26 NOTE — Patient Instructions (Signed)
It was great to see you today! No medication changes are needed at this time.  Labs today We will only contact you if something comes back abnormal or we need to make some changes. Otherwise no news is good news!  Please return 01/27/2022 for another EKG  Your physician recommends that you schedule a follow-up appointment in: 8 weeks  in the Advanced Practitioners (PA/NP) Clinic    Do the following things EVERYDAY: Weigh yourself in the morning before breakfast. Write it down and keep it in a log. Take your medicines as prescribed Eat low salt foods--Limit salt (sodium) to 2000 mg per day.  Stay as active as you can everyday Limit all fluids for the day to less than 2 liters   At the [facility], you and your health needs are our priority. As part of our continuing mission to provide you with exceptional heart care, we have created designated Provider Care Teams. These Care Teams include your primary Cardiologist (physician) and Advanced Practice Providers (APPs- Physician Assistants and Nurse Practitioners) who all work together to provide you with the care you need, when you need it.   You may see any of the following providers on your designated Care Team at your next follow up: Dr Glori Bickers Dr Haynes Kerns, NP Lyda Jester, [place] [facility] [place], [place] Audry Riles, PharmD   Please be sure to bring in all your medications bottles to every appointment.

## 2022-01-26 NOTE — Telephone Encounter (Signed)
Transition Care Management Unsuccessful Follow-up Telephone Call  Date of discharge and from where:  01/23/2022, [facility]  Attempts:  3rd Attempt  Reason for unsuccessful TCM follow-up call:  Left voice message on #  [phone], call back requested.   Patient has an appointment with Juluis Mire, NP @ [facility]- 02/06/2022.

## 2022-01-27 ENCOUNTER — Other Ambulatory Visit (HOSPITAL_COMMUNITY): Payer: Self-pay | Admitting: *Deleted

## 2022-01-27 ENCOUNTER — Telehealth (HOSPITAL_COMMUNITY): Payer: Self-pay | Admitting: Surgery

## 2022-01-27 ENCOUNTER — Encounter (HOSPITAL_COMMUNITY): Payer: Self-pay | Admitting: Internal Medicine

## 2022-01-27 ENCOUNTER — Encounter (HOSPITAL_COMMUNITY): Payer: Self-pay

## 2022-01-27 ENCOUNTER — Ambulatory Visit (HOSPITAL_COMMUNITY)
Admission: RE | Admit: 2022-01-27 | Discharge: 2022-01-27 | Disposition: A | Discharge status: Home / Self Care | Payer: 59 | Source: Ambulatory Visit | Attending: Cardiology | Admitting: Cardiology

## 2022-01-27 ENCOUNTER — Encounter (HOSPITAL_COMMUNITY): Payer: Self-pay | Admitting: *Deleted

## 2022-01-27 VITALS — BP 116/80 | Wt 291.0 lb

## 2022-01-27 DIAGNOSIS — I4892 Unspecified atrial flutter: Secondary | ICD-10-CM

## 2022-01-27 DIAGNOSIS — I4821 Permanent atrial fibrillation: Secondary | ICD-10-CM | POA: Insufficient documentation

## 2022-01-27 DIAGNOSIS — I5042 Chronic combined systolic (congestive) and diastolic (congestive) heart failure: Secondary | ICD-10-CM

## 2022-01-27 MED ORDER — POTASSIUM CHLORIDE CRYS ER 20 MEQ PO TBCR: 60.0000 meq | EXTENDED_RELEASE_TABLET | Freq: Every day | ORAL | 3 refills | Status: DC

## 2022-01-27 NOTE — Progress Notes (Signed)
Attempted to obtain medical history via telephone, unable to reach at this time. HIPAA compliant voicemail message left requesting return call to pre surgical testing department. 

## 2022-01-27 NOTE — Progress Notes (Signed)
Patient had a nurse visit today for an ekg. Patient ekg showed she was still in  a flutter. Per Marlyce Huge, PA schedule DCCV with Dr.Bensimhon next week. DCCV scheduled for 6/13 at 8:00am. DCCV instruction letter printed and reviewed with patient.

## 2022-01-27 NOTE — Telephone Encounter (Signed)
-----   Message from Joette Catching, [place] sent at 01/26/2022 12:55 PM EDT ----- Labs stable except K low. Increase potassium chloride to 60 mEq daily. BMET 7-10 days.

## 2022-01-27 NOTE — Telephone Encounter (Signed)
I called patient and reviewed results and recommendations per provider.  She will return on Friday June 16th for repeat labwork.  I have updated medication change in [facility] and she is aware and agreeable.

## 2022-01-30 ENCOUNTER — Telehealth (HOSPITAL_COMMUNITY): Payer: Self-pay

## 2022-01-30 NOTE — Anesthesia Preprocedure Evaluation (Addendum)
Anesthesia Evaluation  Patient identified by MRN, date of birth, ID band Patient awake    Reviewed: Allergy & Precautions, NPO status , Patient's Chart, lab work & pertinent test results, reviewed documented beta blocker date and time   History of Anesthesia Complications Negative for: history of anesthetic complications  Airway Mallampati: II  TM Distance: >3 FB Neck ROM: Full    Dental  (+) Dental Advisory Given   Pulmonary asthma , sleep apnea , COPD,  COPD inhaler,    Pulmonary exam normal        Cardiovascular hypertension, Pt. on home beta blockers and Pt. on medications +CHF and + DVT  + dysrhythmias Atrial Fibrillation  Rhythm:Irregular Rate:Normal   '23 TEE - EF 55%. Right ventricular systolic function is severely reduced. The right ventricular size is severely enlarged. Left atrial size was severely dilated. Right atrial size was severely dilated. The mitral valve has been repaired/replaced. Trivial mitral valve regurgitation. Mild mitral stenosis. The RA and RV are severely dilated. The tricuspid valve leaflets do no coapt. The tricuspid valve is has been repaired/replaced. The tricuspid valve is status post repair with an annuloplasty ring. Tricuspid valve  regurgitation is severe.     Neuro/Psych PSYCHIATRIC DISORDERS Anxiety Depression negative neurological ROS     GI/Hepatic Neg liver ROS, GERD  Medicated,  Endo/Other  diabetes, Type 2, Insulin DependentMorbid obesity  Renal/GU Renal InsufficiencyRenal disease     Musculoskeletal  (+) Arthritis ,   Abdominal   Peds  Hematology  (+) Blood dyscrasia, anemia ,  On eliquis    Anesthesia Other Findings   Reproductive/Obstetrics                            Anesthesia Physical Anesthesia Plan  ASA: 4  Anesthesia Plan: General   Post-op Pain Management: Minimal or no pain anticipated   Induction: Intravenous  PONV Risk Score  and Plan: 3 and Treatment may vary due to age or medical condition and Propofol infusion  Airway Management Planned: Natural Airway and Mask  Additional Equipment: None  Intra-op Plan:   Post-operative Plan:   Informed Consent: I have reviewed the patients History and Physical, chart, labs and discussed the procedure including the risks, benefits and alternatives for the proposed anesthesia with the patient or authorized representative who has indicated his/her understanding and acceptance.     Dental advisory given  Plan Discussed with: CRNA and Anesthesiologist  Anesthesia Plan Comments:        Anesthesia Quick Evaluation

## 2022-01-30 NOTE — Telephone Encounter (Signed)
VM left for patient regarding cardioversion scheduled for tomorrow.Any questions call office.

## 2022-01-31 ENCOUNTER — Encounter (HOSPITAL_COMMUNITY)
Admission: RE | Disposition: A | Discharge status: Home / Self Care | Payer: Self-pay | Source: Home / Self Care | Attending: Internal Medicine

## 2022-01-31 ENCOUNTER — Encounter (HOSPITAL_COMMUNITY): Payer: Self-pay | Admitting: Internal Medicine

## 2022-01-31 ENCOUNTER — Ambulatory Visit (HOSPITAL_COMMUNITY): Payer: 59 | Admitting: Anesthesiology

## 2022-01-31 ENCOUNTER — Other Ambulatory Visit: Payer: Self-pay

## 2022-01-31 ENCOUNTER — Ambulatory Visit (HOSPITAL_BASED_OUTPATIENT_CLINIC_OR_DEPARTMENT_OTHER): Payer: 59 | Admitting: Anesthesiology

## 2022-01-31 ENCOUNTER — Ambulatory Visit (HOSPITAL_COMMUNITY)
Admission: RE | Admit: 2022-01-31 | Discharge: 2022-01-31 | Disposition: A | Discharge status: Home / Self Care | Payer: 59 | Attending: Internal Medicine | Admitting: Internal Medicine

## 2022-01-31 DIAGNOSIS — N184 Chronic kidney disease, stage 4 (severe): Secondary | ICD-10-CM | POA: Diagnosis not present

## 2022-01-31 DIAGNOSIS — D649 Anemia, unspecified: Secondary | ICD-10-CM | POA: Insufficient documentation

## 2022-01-31 DIAGNOSIS — I130 Hypertensive heart and chronic kidney disease with heart failure and stage 1 through stage 4 chronic kidney disease, or unspecified chronic kidney disease: Secondary | ICD-10-CM | POA: Diagnosis not present

## 2022-01-31 DIAGNOSIS — D759 Disease of blood and blood-forming organs, unspecified: Secondary | ICD-10-CM | POA: Insufficient documentation

## 2022-01-31 DIAGNOSIS — I4892 Unspecified atrial flutter: Secondary | ICD-10-CM

## 2022-01-31 DIAGNOSIS — I340 Nonrheumatic mitral (valve) insufficiency: Secondary | ICD-10-CM | POA: Diagnosis not present

## 2022-01-31 DIAGNOSIS — Z6841 Body Mass Index (BMI) 40.0 and over, adult: Secondary | ICD-10-CM | POA: Insufficient documentation

## 2022-01-31 DIAGNOSIS — I509 Heart failure, unspecified: Secondary | ICD-10-CM | POA: Diagnosis not present

## 2022-01-31 DIAGNOSIS — I110 Hypertensive heart disease with heart failure: Secondary | ICD-10-CM

## 2022-01-31 DIAGNOSIS — I493 Ventricular premature depolarization: Secondary | ICD-10-CM | POA: Diagnosis not present

## 2022-01-31 DIAGNOSIS — I480 Paroxysmal atrial fibrillation: Secondary | ICD-10-CM | POA: Diagnosis not present

## 2022-01-31 DIAGNOSIS — Z7901 Long term (current) use of anticoagulants: Secondary | ICD-10-CM | POA: Insufficient documentation

## 2022-01-31 DIAGNOSIS — Z79899 Other long term (current) drug therapy: Secondary | ICD-10-CM | POA: Insufficient documentation

## 2022-01-31 DIAGNOSIS — Z794 Long term (current) use of insulin: Secondary | ICD-10-CM | POA: Diagnosis not present

## 2022-01-31 DIAGNOSIS — I2510 Atherosclerotic heart disease of native coronary artery without angina pectoris: Secondary | ICD-10-CM | POA: Insufficient documentation

## 2022-01-31 DIAGNOSIS — G4733 Obstructive sleep apnea (adult) (pediatric): Secondary | ICD-10-CM | POA: Diagnosis not present

## 2022-01-31 DIAGNOSIS — I5043 Acute on chronic combined systolic (congestive) and diastolic (congestive) heart failure: Secondary | ICD-10-CM | POA: Diagnosis not present

## 2022-01-31 DIAGNOSIS — F419 Anxiety disorder, unspecified: Secondary | ICD-10-CM | POA: Diagnosis not present

## 2022-01-31 DIAGNOSIS — I4891 Unspecified atrial fibrillation: Secondary | ICD-10-CM | POA: Diagnosis not present

## 2022-01-31 DIAGNOSIS — E1122 Type 2 diabetes mellitus with diabetic chronic kidney disease: Secondary | ICD-10-CM | POA: Insufficient documentation

## 2022-01-31 HISTORY — PX: CARDIOVERSION: SHX1299

## 2022-01-31 LAB — POCT I-STAT, CHEM 8
BUN: 51 mg/dL — ABNORMAL HIGH (ref 8–23)
Calcium, Ion: 1.16 mmol/L (ref 1.15–1.40)
Chloride: 101 mmol/L (ref 98–111)
Creatinine, Ser: 2.6 mg/dL — ABNORMAL HIGH (ref 0.44–1.00)
Glucose, Bld: 131 mg/dL — ABNORMAL HIGH (ref 70–99)
HCT: 31 % — ABNORMAL LOW (ref 36.0–46.0)
Hemoglobin: 10.5 g/dL — ABNORMAL LOW (ref 12.0–15.0)
Potassium: 4.4 mmol/L (ref 3.5–5.1)
Sodium: 142 mmol/L (ref 135–145)
TCO2: 31 mmol/L (ref 22–32)

## 2022-01-31 SURGERY — CARDIOVERSION
Anesthesia: General

## 2022-01-31 MED ORDER — PROPOFOL 10 MG/ML IV BOLUS
INTRAVENOUS | Status: DC | PRN
  Administered 2022-01-31: 70 mg via INTRAVENOUS

## 2022-01-31 MED ORDER — LIDOCAINE 2% (20 MG/ML) 5 ML SYRINGE
INTRAMUSCULAR | Status: DC | PRN
  Administered 2022-01-31: 60 mg via INTRAVENOUS

## 2022-01-31 MED ORDER — APIXABAN 5 MG PO TABS
5.0000 mg | ORAL_TABLET | Freq: Once | ORAL | Status: AC
Start: 2022-01-31 — End: 2022-01-31
  Administered 2022-01-31: 5 mg via ORAL
  Filled 2022-01-31: qty 1

## 2022-01-31 NOTE — Discharge Instructions (Signed)
Electrical Cardioversion ° °Electrical cardioversion is the delivery of a jolt of electricity to restore a normal rhythm to the heart. A rhythm that is too fast or is not regular keeps the heart from pumping well. In this procedure, sticky patches or metal paddles are placed on the chest to deliver electricity to the heart from a device.  If this information does not answer your questions, please call [facility] office at [phone] to clarify.  ° °Follow these instructions at home: °You may have some redness on the skin where the shocks were given.  You may apply over-the-counter hydrocortisone cream or aloe vera to alleviate skin irritation. °YOU SHOULD NOT DRIVE, use power tools, machinery or perform tasks that involve climbing or major physical exertion for 24 hours (because of the sedation medicines used during the test).  °Take over-the-counter and prescription medicines only as told by your health care provider. °Ask your health care provider how to check your pulse. Check it often. °Rest for 48 hours after the procedure or as told by your health care provider. °Avoid or limit your caffeine use as told by your health care provider. °Keep all follow-up visits as told by your health care provider. This is important. ° °FOLLOW UP:  °Please also call with any specific questions about appointments or follow up tests.   °

## 2022-01-31 NOTE — CV Procedure (Signed)
    DIRECT CURRENT CARDIOVERSION  NAME:  [name]   MRN: [id number] DOB:  August 03, 1956   ADMIT DATE: 01/31/2022   INDICATIONS: Atrial flutter   PROCEDURE:   Informed consent was obtained prior to the procedure. The risks, benefits and alternatives for the procedure were discussed and the patient comprehended these risks. Once an appropriate time out was taken, the patient had the defibrillator pads placed in the anterior and posterior position. The patient then underwent sedation by the anesthesia service. Once an appropriate level of sedation was achieved, the patient received a single biphasic, synchronized 200J shock with prompt conversion to sinus rhythm. No apparent complications.  Glori Bickers, MD  7:59 AM

## 2022-01-31 NOTE — Transfer of Care (Signed)
Immediate Anesthesia Transfer of Care Note  Patient: [name]  Procedure(s) Performed: CARDIOVERSION  Patient Location: PACU and Endoscopy Unit  Anesthesia Type:General  Level of Consciousness: drowsy  Airway & Oxygen Therapy: Patient Spontanous Breathing and Patient connected to nasal cannula oxygen  Post-op Assessment: Report given to RN and Post -op Vital signs reviewed and stable  Post vital signs: Reviewed and stable  Last Vitals:  Vitals Value Taken Time  BP    Temp    Pulse    Resp    SpO2      Last Pain:  Vitals:   01/31/22 0720  TempSrc: Temporal  PainSc: 0-No pain         Complications: No notable events documented.

## 2022-01-31 NOTE — Interval H&P Note (Signed)
History and Physical Interval Note:  01/31/2022 7:58 AM  [name]  has presented today for surgery, with the diagnosis of A Fib.  The various methods of treatment have been discussed with the patient and family. After consideration of risks, benefits and other options for treatment, the patient has consented to  Procedure(s): CARDIOVERSION (N/A) as a surgical intervention.  The patient's history has been reviewed, patient examined, no change in status, stable for surgery.  I have reviewed the patient's chart and labs.  Questions were answered to the patient's satisfaction.     Odel Schmid

## 2022-01-31 NOTE — Anesthesia Postprocedure Evaluation (Signed)
Anesthesia Post Note  Patient: [name]  Procedure(s) Performed: CARDIOVERSION     Patient location during evaluation: PACU Anesthesia Type: General Level of consciousness: awake and alert Pain management: pain level controlled Vital Signs Assessment: post-procedure vital signs reviewed and stable Respiratory status: spontaneous breathing, nonlabored ventilation and respiratory function stable Cardiovascular status: stable and blood pressure returned to baseline Anesthetic complications: no   No notable events documented.  Last Vitals:  Vitals:   01/31/22 0815 01/31/22 0820  BP: 127/76 129/64  Pulse: 76 77  Resp: 17 20  Temp:    SpO2: 95% 97%    Last Pain:  Vitals:   01/31/22 0800  TempSrc: Temporal  PainSc:                  Audry Pili

## 2022-02-01 ENCOUNTER — Emergency Department (HOSPITAL_BASED_OUTPATIENT_CLINIC_OR_DEPARTMENT_OTHER): Payer: 59 | Admitting: Radiology

## 2022-02-01 ENCOUNTER — Encounter (HOSPITAL_COMMUNITY): Payer: Self-pay | Admitting: Internal Medicine

## 2022-02-01 ENCOUNTER — Emergency Department (HOSPITAL_BASED_OUTPATIENT_CLINIC_OR_DEPARTMENT_OTHER)
Admission: EM | Admit: 2022-02-01 | Discharge: 2022-02-01 | Disposition: A | Discharge status: Home / Self Care | Payer: 59 | Attending: Emergency Medicine | Admitting: Emergency Medicine

## 2022-02-01 ENCOUNTER — Other Ambulatory Visit: Payer: Self-pay

## 2022-02-01 ENCOUNTER — Emergency Department (HOSPITAL_BASED_OUTPATIENT_CLINIC_OR_DEPARTMENT_OTHER): Payer: 59

## 2022-02-01 DIAGNOSIS — R079 Chest pain, unspecified: Secondary | ICD-10-CM

## 2022-02-01 DIAGNOSIS — S8992XA Unspecified injury of left lower leg, initial encounter: Secondary | ICD-10-CM | POA: Diagnosis present

## 2022-02-01 DIAGNOSIS — I69244 Monoplegia of lower limb following other nontraumatic intracranial hemorrhage affecting left non-dominant side: Secondary | ICD-10-CM | POA: Insufficient documentation

## 2022-02-01 DIAGNOSIS — Z794 Long term (current) use of insulin: Secondary | ICD-10-CM | POA: Diagnosis not present

## 2022-02-01 DIAGNOSIS — S8012XA Contusion of left lower leg, initial encounter: Secondary | ICD-10-CM | POA: Insufficient documentation

## 2022-02-01 DIAGNOSIS — Z7901 Long term (current) use of anticoagulants: Secondary | ICD-10-CM | POA: Diagnosis not present

## 2022-02-01 DIAGNOSIS — I4891 Unspecified atrial fibrillation: Secondary | ICD-10-CM | POA: Diagnosis not present

## 2022-02-01 DIAGNOSIS — R0789 Other chest pain: Secondary | ICD-10-CM | POA: Insufficient documentation

## 2022-02-01 DIAGNOSIS — Z952 Presence of prosthetic heart valve: Secondary | ICD-10-CM | POA: Insufficient documentation

## 2022-02-01 DIAGNOSIS — X58XXXA Exposure to other specified factors, initial encounter: Secondary | ICD-10-CM | POA: Insufficient documentation

## 2022-02-01 DIAGNOSIS — W109XXA Fall (on) (from) unspecified stairs and steps, initial encounter: Secondary | ICD-10-CM | POA: Insufficient documentation

## 2022-02-01 DIAGNOSIS — S0990XA Unspecified injury of head, initial encounter: Secondary | ICD-10-CM | POA: Diagnosis present

## 2022-02-01 LAB — CBC
HCT: 28.6 % — ABNORMAL LOW (ref 36.0–46.0)
Hemoglobin: 8.5 g/dL — ABNORMAL LOW (ref 12.0–15.0)
MCH: 25.8 pg — ABNORMAL LOW (ref 26.0–34.0)
MCHC: 29.7 g/dL — ABNORMAL LOW (ref 30.0–36.0)
MCV: 86.9 fL (ref 80.0–100.0)
Platelets: 196 10*3/uL (ref 150–400)
RBC: 3.29 MIL/uL — ABNORMAL LOW (ref 3.87–5.11)
RDW: 15.3 % (ref 11.5–15.5)
WBC: 5.6 10*3/uL (ref 4.0–10.5)
nRBC: 0 % (ref 0.0–0.2)

## 2022-02-01 LAB — TROPONIN I (HIGH SENSITIVITY)
Troponin I (High Sensitivity): 7 ng/L (ref <18)
Troponin I (High Sensitivity): 6 ng/L (ref <18)

## 2022-02-01 LAB — BASIC METABOLIC PANEL
Anion gap: 12 (ref 5–15)
BUN: 43 mg/dL — ABNORMAL HIGH (ref 8–23)
CO2: 26 mmol/L (ref 22–32)
Calcium: 10 mg/dL (ref 8.9–10.3)
Chloride: 103 mmol/L (ref 98–111)
Creatinine, Ser: 3.21 mg/dL — ABNORMAL HIGH (ref 0.44–1.00)
GFR, Estimated: 15 mL/min — ABNORMAL LOW (ref >60)
Glucose, Bld: 166 mg/dL — ABNORMAL HIGH (ref 70–99)
Potassium: 3.9 mmol/L (ref 3.5–5.1)
Sodium: 141 mmol/L (ref 135–145)

## 2022-02-01 MED ORDER — ACETAMINOPHEN 325 MG PO TABS
650.0000 mg | ORAL_TABLET | Freq: Once | ORAL | Status: AC
  Administered 2022-02-01: 650 mg via ORAL
  Filled 2022-02-01: qty 2

## 2022-02-01 MED ORDER — OXYCODONE HCL 5 MG PO TABS: 5.0000 mg | ORAL_TABLET | Freq: Four times a day (QID) | ORAL | 0 refills | Status: DC | PRN

## 2022-02-01 MED ORDER — OXYCODONE-ACETAMINOPHEN 5-325 MG PO TABS
1.0000 | ORAL_TABLET | Freq: Once | ORAL | Status: AC
  Administered 2022-02-01: 1 via ORAL
  Filled 2022-02-01: qty 1

## 2022-02-01 NOTE — ED Triage Notes (Signed)
Onset last night of chest pain.  Right sideStates Defibrillator  shocked her last night and now feels like her heart is "out of rhythm"   Having SOB

## 2022-02-01 NOTE — Discharge Instructions (Addendum)
Recommend buying and using a walker at home for extra stability.

## 2022-02-01 NOTE — ED Notes (Signed)
Patient transported to CT 

## 2022-02-01 NOTE — ED Provider Notes (Signed)
[facility] EMERGENCY DEPT Provider Note   CSN: [id number] Arrival date & time: 02/01/22  1216     History  Chief Complaint  Patient presents with   Chest Pain    [name] is a 66 y.o. female with a history of atrial fibrillation on Eliquis, recent cardioversion yesterday in the hospital, presenting to the ED with complaint of chest pain, and a fall.  Patient reports that she has been having some chest discomfort in the right side of her chest which is sharp that began yesterday after she got home from her cardioversion, although she has had similar chest pains for a long time in the past.  She reports that she lost her footing and also fell face forward down about 3 steps, feels that she twisted or injured her right ankle, and has a "hard knot" in the anterior lower side of her right shin, and also struck her face or cheek on the ground.  She denies loss of consciousness.  She is here because she is complaining of pain  HPI     Home Medications Prior to Admission medications   Medication Sig Start Date End Date Taking? Authorizing Provider  oxyCODONE (ROXICODONE) 5 MG immediate release tablet Take 1 tablet (5 mg total) by mouth every 6 (six) hours as needed for up to 15 doses for severe pain. 02/01/22  Yes Winnona Wargo, Carola Rhine, MD  acetaminophen (TYLENOL) 325 MG tablet Take 2 tablets (650 mg total) by mouth every 6 (six) hours as needed for mild pain or moderate pain. 12/07/21   Arrien, Jimmy Picket, MD  albuterol (VENTOLIN HFA) 108 (90 Base) MCG/ACT inhaler Inhale 1-2 puffs into the lungs every 6 (six) hours as needed for wheezing or shortness of breath. 12/29/21   [name], NP  Alcohol Swabs (B-D SINGLE USE SWABS REGULAR) PADS 1 each by Other route daily. 07/06/21   [name], NP  Alcohol Swabs PADS 1 application by Does not apply route in the morning, at noon, and at bedtime. 09/14/21   [name], NP  amiodarone (PACERONE) 200 MG tablet  Take 1 tablet (200 mg total) by mouth daily. 09/27/21   Vickie Epley, MD  apixaban (ELIQUIS) 5 MG TABS tablet Take 1 tablet (5 mg total) by mouth 2 (two) times daily. 05/16/21   Rafael Bihari, FNP  blood glucose meter kit and supplies KIT Dispense based on patient and insurance preference. Use up to four times daily as directed. 02/10/21   Patrecia Pour, MD  blood glucose meter kit and supplies Dispense based on patient and insurance preference. Use up to four times daily as directed. (FOR ICD-10 E10.9, E11.9). 07/06/21   [name], NP  Blood Glucose Monitoring Suppl (ACCU-CHEK GUIDE ME) w/Device KIT Use as instructed to check blood sugar three times daily. E11.69 04/21/20   Charlott Rakes, MD  busPIRone (BUSPAR) 5 MG tablet Take 1 tablet (5 mg total) by mouth 2 (two) times daily. 09/14/21   [name], NP  colchicine 0.6 MG tablet Take 0.5 tablets (0.3 mg total) by mouth daily. 12/08/21   Charlott Rakes, MD  diphenhydrAMINE HCl (BENADRYL PO) Take 1 tablet by mouth daily as needed (itchyness).    [provider]  diphenhydrAMINE-Zinc Acetate (BENADRYL ITCH RELIEF EX) Apply 1 application. topically daily as needed (itchyness).    [provider]  docusate sodium (COLACE) 100 MG capsule Take 100 mg by mouth 2 (two) times daily.  [provider]  Dulaglutide (TRULICITY) 1.5 MG/0.5ML SOPN Inject 1.5 mg into the skin every Wednesday. 01/25/22   Grayce Sessions, NP  DULoxetine (CYMBALTA) 60 MG capsule Take 1 capsule (60 mg total) by mouth daily. 09/14/21   Grayce Sessions, NP  EPINEPHrine 0.3 mg/0.3 mL IJ SOAJ injection Inject 0.3 mg into the muscle as needed for anaphylaxis. 06/25/20   Grayce Sessions, NP  fluticasone (FLONASE) 50 MCG/ACT nasal spray Place 2 sprays into both nostrils daily. 12/29/21   Grayce Sessions, NP  glucose blood (ACCU-CHEK GUIDE) test strip Use to check blood sugar TID. E11.69 07/13/21   Hoy Register, MD  insulin  aspart (NOVOLOG) 100 UNIT/ML injection Inject 2-10 Units into the skin 3 (three) times daily before meals. Per sliding scale 09/02/21   Glade Lloyd, MD  lidocaine (XYLOCAINE) 5 % ointment Apply 1 application. topically as needed for moderate pain. First choice is lidocaine patch, but if not covered/expensive, please fill ointment Patient taking differently: Apply 1 application  topically 2 (two) times daily as needed for moderate pain. 11/08/21   Jodelle Red, MD  loratadine (CLARITIN) 10 MG tablet Take 1 tablet (10 mg total) by mouth daily. 12/29/21   Grayce Sessions, NP  metolazone (ZAROXOLYN) 2.5 MG tablet Take 2.5 mg by mouth 3 (three) times a week. Monday Wednesday and Friday.    [provider]  metoprolol succinate (TOPROL-XL) 25 MG 24 hr tablet Take 1 tablet (25 mg total) by mouth in the morning and at bedtime. 01/02/22   Bensimhon, Bevelyn Buckles, MD  Multiple Vitamins-Minerals (MULTIVITAMIN WOMEN PO) Take 1 tablet by mouth daily.    [provider]  nitroGLYCERIN (NITROSTAT) 0.4 MG SL tablet Place 1 tablet (0.4 mg total) under the tongue every 5 (five) minutes x 3 doses as needed for chest pain. 01/08/14   Rinaldo Cloud, MD  pantoprazole (PROTONIX) 40 MG tablet TAKE ONE TABLET BY MOUTH DAILY AT 9AM 01/20/22   Grayce Sessions, NP  potassium chloride SA (KLOR-CON M) 20 MEQ tablet Take 3 tablets (60 mEq total) by mouth daily. Take an additional 20 meq with every dose of METOLAZONE 01/27/22   Andrey Farmer, PA-C  RESTASIS 0.05 % ophthalmic emulsion Place 1 drop into both eyes 2 (two) times daily. 11/19/17   [provider]  rosuvastatin (CRESTOR) 10 MG tablet Take 1 tablet (10 mg total) by mouth every evening. 09/02/21   Glade Lloyd, MD  torsemide (DEMADEX) 20 MG tablet Take 3 tablets (60 mg total) by mouth 2 (two) times daily. 01/23/22   Tyrone Nine, MD      Allergies    Bee pollen and Sulfa antibiotics    Review of Systems   Review of  Systems  Physical Exam Updated Vital Signs BP 107/81 (BP Location: Right Arm)   Pulse 80   Temp 98 F (36.7 C) (Oral)   Resp 16   Ht 5\' 7"  (1.702 m)   Wt 134.3 kg   SpO2 97%   BMI 46.36 kg/m  Physical Exam Constitutional:      General: She is not in acute distress. HENT:     Head: Normocephalic and atraumatic.  Eyes:     Conjunctiva/sclera: Conjunctivae normal.     Pupils: Pupils are equal, round, and reactive to light.  Cardiovascular:     Rate and Rhythm: Normal rate and regular rhythm.  Pulmonary:     Effort: Pulmonary effort is normal. No respiratory distress.  Abdominal:  General: There is no distension.     Tenderness: There is no abdominal tenderness.  Musculoskeletal:     Comments: Nodule of the right lower tibia just above the ankle, which is tender, the anterior surface  Skin:    General: Skin is warm and dry.  Neurological:     General: No focal deficit present.     Mental Status: She is alert. Mental status is at baseline.  Psychiatric:        Mood and Affect: Mood normal.        Behavior: Behavior normal.     ED Results / Procedures / Treatments   Labs (all labs ordered are listed, but only abnormal results are displayed) Labs Reviewed  BASIC METABOLIC PANEL - Abnormal; Notable for the following components:      Result Value   Glucose, Bld 166 (*)    BUN 43 (*)    Creatinine, Ser 3.21 (*)    GFR, Estimated 15 (*)    All other components within normal limits  CBC - Abnormal; Notable for the following components:   RBC 3.29 (*)    Hemoglobin 8.5 (*)    HCT 28.6 (*)    MCH 25.8 (*)    MCHC 29.7 (*)    All other components within normal limits  TROPONIN I (HIGH SENSITIVITY)  TROPONIN I (HIGH SENSITIVITY)    EKG EKG Interpretation  Date/Time:  Wednesday February 01 2022 12:24:32 EDT Ventricular Rate:  82 PR Interval:  214 QRS Duration: 98 QT Interval:  464 QTC Calculation: 542 R Axis:   135 Text Interpretation: Sinus rhythm with 1st  degree A-V block with Premature atrial complexes with Abberant conduction Incomplete right bundle branch block Left posterior fascicular block Anterior infarct (cited on or before 21-Jan-2022) Prolonged QT , baseline wander inferior leads Abnormal ECG When compared with ECG of 31-Jan-2022 08:06, Premature ventricular complexes are no longer Present Abberant conduction is now Present Incomplete right bundle branch block is now Present Confirmed by Octaviano Glow [phone]) on 02/01/2022 2:58:40 PM  Radiology CT Head Wo Contrast  Result Date: 02/01/2022 CLINICAL DATA:  Head trauma, fall yesterday EXAM: CT HEAD WITHOUT CONTRAST TECHNIQUE: Contiguous axial images were obtained from the base of the skull through the vertex without intravenous contrast. RADIATION DOSE REDUCTION: This exam was performed according to the departmental dose-optimization program which includes automated exposure control, adjustment of the mA and/or kV according to patient size and/or use of iterative reconstruction technique. COMPARISON:  11/28/2021 FINDINGS: Brain: No evidence of acute infarction, hemorrhage, hydrocephalus, extra-axial collection or mass lesion/mass effect. Vascular: No hyperdense vessel or unexpected calcification. Skull: Hyperostosis frontalis. Negative for fracture or focal lesion. Sinuses/Orbits: No acute finding. Other: None. IMPRESSION: No acute intracranial pathology. Electronically Signed   By: Delanna Ahmadi M.D.   On: 02/01/2022 15:57   DG Ankle Complete Right  Result Date: 02/01/2022 CLINICAL DATA:  Fall down steps.  Right ankle injury and pain. EXAM: RIGHT ANKLE - COMPLETE 3+ VIEW COMPARISON:  None Available. FINDINGS: There is no evidence of acute fracture, dislocation, or joint effusion. A well corticated ossific density is seen adjacent to the lateral malleolus, which may represent accessory ossicle or old avulsion injury. Prominent dorsal and plantar calcaneal bone spurs are incidentally noted.  IMPRESSION: No acute findings. Prominent dorsal and plantar calcaneal bone spurs. Electronically Signed   By: Marlaine Hind M.D.   On: 02/01/2022 15:36   DG Chest Port 1 View  Result Date: 02/01/2022 CLINICAL DATA:  Right-sided  chest pain EXAM: PORTABLE CHEST 1 VIEW COMPARISON:  01/17/2022 and CT chest 11/21/2021. FINDINGS: Trachea is midline. Heart is enlarged, stable. Left atrial appendage clip is in place. Tricuspid and mitral valve replacements. Streaky atelectasis in the left lower lobe. Lungs are otherwise clear. No pleural fluid. IMPRESSION: No acute findings. Electronically Signed   By: Leanna Battles M.D.   On: 02/01/2022 13:04    Procedures Procedures    Medications Ordered in ED Medications  acetaminophen (TYLENOL) tablet 650 mg (650 mg Oral Given 02/01/22 1446)  oxyCODONE-acetaminophen (PERCOCET/ROXICET) 5-325 MG per tablet 1 tablet (1 tablet Oral Given 02/01/22 1523)    ED Course/ Medical Decision Making/ A&P Clinical Course as of 02/01/22 2332  Wed Feb 01, 2022  1647 Pain improved - daughter here to take her home [MT]    Clinical Course User Index [MT] Atiyana Welte, Kermit Balo, MD                           Medical Decision Making Amount and/or Complexity of Data Reviewed Labs: ordered. Radiology: ordered.  Risk Prescription drug management.   This patient presents to the ED with concern for chest pain, fall, face and leg injury. This involves an extensive number of treatment options, and is a complaint that carries with it a high risk of complications and morbidity.  The differential diagnosis includes contusion versus hematoma versus atypical ACS versus other  With the location description of her pain in right lower leg is strongly suspect this is a hematoma from her fall, not a blood clot, given it is not located along the DVT system, and she is anticoagulated with Eliquis  Co-morbidities that complicate the patient evaluation: Patient is on Eliquis which raises concern  for bleeding  External records from outside source obtained and reviewed including [facility] on May 2019:  Prox LAD lesion is 10% stenosed. Dist LM to Ost LAD lesion is 10% stenosed. Ost Cx lesion is 15% stenosed. The left ventricular systolic function is normal. LV end diastolic pressure is normal.   No significant coronary disease noted at that time.  Echocardiogram performed at start of this month showed severe biatrial dilation, mitral valve repair, mild mitral stenosis, right ventricular dilatation.  She has an EF of 55% with decreased left ventricular function.  I ordered and personally interpreted labs.  The pertinent results include: Troponins unremarkable, hemoglobin stable and near baseline level today, chronic kidney disease and creatinine at baseline levels  I ordered imaging studies including x-ray of the chest, CT of the head, x-ray of the right ankle I independently visualized and interpreted imaging which showed no acute traumatic injuries I agree with the radiologist interpretation  The patient was maintained on a cardiac monitor.  I personally viewed and interpreted the cardiac monitored which showed an underlying rhythm of: Sinus rhythm  Per my interpretation the patient's ECG shows sinus rhythm with occasional PVCs  I ordered medication including Percocet for pain  Test Considered: Doubt acute PE, patient is appropriately anticoagulated   Her chest pain is quite atypical for ACS, she does not have any significant coronary disease.   After the interventions noted above, I reevaluated the patient and found that they have: improved   Dispostion:  After consideration of the diagnostic results and the patients response to treatment, I feel that the patent would benefit from outpatient PCP follow.         Final Clinical Impression(s) / ED Diagnoses Final diagnoses:  Chest pain, unspecified type  Hematoma of left lower extremity, initial encounter    Rx / DC  Orders ED Discharge Orders          Ordered    oxyCODONE (ROXICODONE) 5 MG immediate release tablet  Every 6 hours PRN        02/01/22 1648              Yentl Verge, Carola Rhine, MD 02/01/22 2332

## 2022-02-01 NOTE — ED Notes (Signed)
Pt states she was cardioverted yesterday outpatient for afib. Reports ongoing chest pain.

## 2022-02-01 NOTE — ED Notes (Signed)
Pt stated she fell yesterday coming home after being released from the hosp. Pt stated she hurts all over now due to the fall. Pt stated her rt leg hurts down by the ankle due to the fall and now she is worried about blood clots. Pt stated she wanted to let her Dr know.

## 2022-02-03 ENCOUNTER — Telehealth (HOSPITAL_COMMUNITY): Payer: Self-pay | Admitting: Cardiology

## 2022-02-03 ENCOUNTER — Ambulatory Visit (HOSPITAL_COMMUNITY)
Admission: RE | Admit: 2022-02-03 | Discharge: 2022-02-03 | Disposition: A | Discharge status: Home / Self Care | Payer: 59 | Source: Ambulatory Visit | Attending: Internal Medicine | Admitting: Internal Medicine

## 2022-02-03 DIAGNOSIS — I5042 Chronic combined systolic (congestive) and diastolic (congestive) heart failure: Secondary | ICD-10-CM

## 2022-02-03 LAB — BASIC METABOLIC PANEL
Anion gap: 11 (ref 5–15)
BUN: 46 mg/dL — ABNORMAL HIGH (ref 8–23)
CO2: 27 mmol/L (ref 22–32)
Calcium: 9.4 mg/dL (ref 8.9–10.3)
Chloride: 105 mmol/L (ref 98–111)
Creatinine, Ser: 3.47 mg/dL — ABNORMAL HIGH (ref 0.44–1.00)
GFR, Estimated: 14 mL/min — ABNORMAL LOW (ref >60)
Glucose, Bld: 127 mg/dL — ABNORMAL HIGH (ref 70–99)
Potassium: 3.6 mmol/L (ref 3.5–5.1)
Sodium: 143 mmol/L (ref 135–145)

## 2022-02-03 MED ORDER — METOLAZONE 2.5 MG PO TABS: 2.5000 mg | ORAL_TABLET | ORAL | 11 refills | Status: DC

## 2022-02-03 NOTE — Telephone Encounter (Signed)
-----   Message from Joette Catching, [place] sent at 02/03/2022  4:19 PM EDT ----- Kidney function slightly worse. Decrease metolazone from three times a week (M, W, F) to twice a week (M, F)

## 2022-02-03 NOTE — Telephone Encounter (Signed)
Patient called.  Patient aware.  

## 2022-02-06 ENCOUNTER — Other Ambulatory Visit: Payer: Self-pay

## 2022-02-06 ENCOUNTER — Telehealth (HOSPITAL_COMMUNITY): Payer: Self-pay | Admitting: *Deleted

## 2022-02-06 ENCOUNTER — Emergency Department (HOSPITAL_COMMUNITY)
Admission: EM | Admit: 2022-02-06 | Discharge: 2022-02-07 | Disposition: A | Discharge status: Home / Self Care | Payer: 59 | Attending: Emergency Medicine | Admitting: Emergency Medicine

## 2022-02-06 ENCOUNTER — Encounter (HOSPITAL_COMMUNITY): Payer: Self-pay

## 2022-02-06 ENCOUNTER — Inpatient Hospital Stay (INDEPENDENT_AMBULATORY_CARE_PROVIDER_SITE_OTHER): Payer: 59 | Admitting: Primary Care

## 2022-02-06 ENCOUNTER — Emergency Department (HOSPITAL_COMMUNITY): Payer: 59

## 2022-02-06 DIAGNOSIS — Z794 Long term (current) use of insulin: Secondary | ICD-10-CM | POA: Diagnosis not present

## 2022-02-06 DIAGNOSIS — R0602 Shortness of breath: Secondary | ICD-10-CM | POA: Diagnosis present

## 2022-02-06 DIAGNOSIS — R7989 Other specified abnormal findings of blood chemistry: Secondary | ICD-10-CM | POA: Diagnosis not present

## 2022-02-06 DIAGNOSIS — I5023 Acute on chronic systolic (congestive) heart failure: Secondary | ICD-10-CM | POA: Diagnosis not present

## 2022-02-06 DIAGNOSIS — R790 Abnormal level of blood mineral: Secondary | ICD-10-CM | POA: Diagnosis not present

## 2022-02-06 DIAGNOSIS — Z79899 Other long term (current) drug therapy: Secondary | ICD-10-CM | POA: Insufficient documentation

## 2022-02-06 DIAGNOSIS — R9431 Abnormal electrocardiogram [ECG] [EKG]: Secondary | ICD-10-CM | POA: Diagnosis not present

## 2022-02-06 DIAGNOSIS — Z7901 Long term (current) use of anticoagulants: Secondary | ICD-10-CM | POA: Diagnosis not present

## 2022-02-06 DIAGNOSIS — I480 Paroxysmal atrial fibrillation: Secondary | ICD-10-CM | POA: Diagnosis not present

## 2022-02-06 DIAGNOSIS — I5082 Biventricular heart failure: Secondary | ICD-10-CM | POA: Diagnosis not present

## 2022-02-06 DIAGNOSIS — Z20822 Contact with and (suspected) exposure to covid-19: Secondary | ICD-10-CM | POA: Diagnosis not present

## 2022-02-06 DIAGNOSIS — R944 Abnormal results of kidney function studies: Secondary | ICD-10-CM | POA: Insufficient documentation

## 2022-02-06 DIAGNOSIS — I4892 Unspecified atrial flutter: Secondary | ICD-10-CM | POA: Diagnosis not present

## 2022-02-06 DIAGNOSIS — I509 Heart failure, unspecified: Secondary | ICD-10-CM | POA: Insufficient documentation

## 2022-02-06 LAB — COMPREHENSIVE METABOLIC PANEL
ALT: 21 U/L (ref 0–44)
AST: 27 U/L (ref 15–41)
Albumin: 3.3 g/dL — ABNORMAL LOW (ref 3.5–5.0)
Alkaline Phosphatase: 145 U/L — ABNORMAL HIGH (ref 38–126)
Anion gap: 10 (ref 5–15)
BUN: 28 mg/dL — ABNORMAL HIGH (ref 8–23)
CO2: 25 mmol/L (ref 22–32)
Calcium: 9.2 mg/dL (ref 8.9–10.3)
Chloride: 106 mmol/L (ref 98–111)
Creatinine, Ser: 2.44 mg/dL — ABNORMAL HIGH (ref 0.44–1.00)
GFR, Estimated: 21 mL/min — ABNORMAL LOW (ref >60)
Glucose, Bld: 100 mg/dL — ABNORMAL HIGH (ref 70–99)
Potassium: 3.3 mmol/L — ABNORMAL LOW (ref 3.5–5.1)
Sodium: 141 mmol/L (ref 135–145)
Total Bilirubin: 1.5 mg/dL — ABNORMAL HIGH (ref 0.3–1.2)
Total Protein: 8 g/dL (ref 6.5–8.1)

## 2022-02-06 LAB — BRAIN NATRIURETIC PEPTIDE: B Natriuretic Peptide: 550.8 pg/mL — ABNORMAL HIGH (ref 0.0–100.0)

## 2022-02-06 LAB — CBC
HCT: 28.4 % — ABNORMAL LOW (ref 36.0–46.0)
Hemoglobin: 8.8 g/dL — ABNORMAL LOW (ref 12.0–15.0)
MCH: 26.6 pg (ref 26.0–34.0)
MCHC: 31 g/dL (ref 30.0–36.0)
MCV: 85.8 fL (ref 80.0–100.0)
Platelets: 206 10*3/uL (ref 150–400)
RBC: 3.31 MIL/uL — ABNORMAL LOW (ref 3.87–5.11)
RDW: 15.5 % (ref 11.5–15.5)
WBC: 5.7 10*3/uL (ref 4.0–10.5)
nRBC: 0 % (ref 0.0–0.2)

## 2022-02-06 LAB — TROPONIN I (HIGH SENSITIVITY): Troponin I (High Sensitivity): 7 ng/L (ref <18)

## 2022-02-06 NOTE — ED Provider Triage Note (Signed)
Emergency Medicine Provider Triage Evaluation Note  JAYLEEN SCAGLIONE , a 66 y.o. female  was evaluated in triage.  Pt complains of shortness of breath, increased peripheral edema.  Symptoms have been going on for the past 6 days.  She is still taking her diuretic but feels symptomatic.  Reports chest tightness.  Denies any fever.  Review of Systems  Positive: Chest tightness, shortness of breath, edema Negative: Fever  Physical Exam  BP (!) 149/79 (BP Location: Right Wrist)   Pulse 91   Temp 98.6 F (37 C) (Oral)   Resp (!) 22   Ht '5\' 7"'$  (1.702 m)   Wt 135.6 kg   SpO2 96%   BMI 46.83 kg/m  Gen:   Awake, no distress   Resp:  Normal effort  MSK:   Moves extremities without difficulty  Other:  Edema noted to bilateral lower extremities, patient tachypneic  Medical Decision Making  Medically screening exam initiated at 6:11 PM.  Appropriate orders placed.  JOSETTE SHIMABUKURO was informed that the remainder of the evaluation will be completed by another provider, this initial triage assessment does not replace that evaluation, and the importance of remaining in the ED until their evaluation is complete.  Labs ordered   Delia Heady, [place] 02/06/22 8416

## 2022-02-06 NOTE — Telephone Encounter (Signed)
Pt called c/o swelling in face, pt said she is more short of breath, and vomiting. Pt said she "cant keep food down". Pt said symptoms started over the weekend. Pt said her weight is up 2lbs overnight. Today is her metolazone day. Pt said she is urinating more frequently but still feels bad.  Routed to [employer]

## 2022-02-06 NOTE — Progress Notes (Deleted)
[facility]   Subjective:   [name] is a 66 y.o. female presents for hospital follow up. Admit date to the hospital was 02/01/22, patient was discharged from the hospital on 02/01/22, patient was admitted for:   Past Medical History:  Diagnosis Date   Acute on chronic systolic CHF (congestive heart failure) ([place]) 12/21/2020   Allergic rhinitis    Arthritis    Asthma    Chronic diastolic CHF (congestive heart failure) ([facility])    COPD (chronic obstructive pulmonary disease) ([facility])    Depression    DM (diabetes mellitus) ([place])    DVT (deep vein thrombosis) in pregnancy    GERD (gastroesophageal reflux disease)    HTN (hypertension)    Hyperlipidemia    Obesity    Pneumonia 04/2018   RIGHT LOBE   Sleep apnea    compliant with CPAP     Allergies  Allergen Reactions   Bee Pollen Anaphylaxis   Sulfa Antibiotics Itching      Current Outpatient Medications on File Prior to Visit  Medication Sig Dispense Refill   acetaminophen (TYLENOL) 325 MG tablet Take 2 tablets (650 mg total) by mouth every 6 (six) hours as needed for mild pain or moderate pain.     albuterol (VENTOLIN HFA) 108 (90 Base) MCG/ACT inhaler Inhale 1-2 puffs into the lungs every 6 (six) hours as needed for wheezing or shortness of breath. 18 g 1   Alcohol Swabs (B-D SINGLE USE SWABS REGULAR) PADS 1 each by Other route daily. 100 each 11   Alcohol Swabs PADS 1 application by Does not apply route in the morning, at noon, and at bedtime. 120 each 11   amiodarone (PACERONE) 200 MG tablet Take 1 tablet (200 mg total) by mouth daily. 90 tablet 3   apixaban (ELIQUIS) 5 MG TABS tablet Take 1 tablet (5 mg total) by mouth 2 (two) times daily. 180 tablet 3   blood glucose meter kit and supplies KIT Dispense based on patient and insurance preference. Use up to four times daily as directed. 1 each 0   blood glucose meter kit and supplies Dispense based on patient and insurance preference. Use up to four times  daily as directed. (FOR ICD-10 E10.9, E11.9). 1 each 0   Blood Glucose Monitoring Suppl (ACCU-CHEK GUIDE ME) w/Device KIT Use as instructed to check blood sugar three times daily. E11.69 1 kit 0   busPIRone (BUSPAR) 5 MG tablet Take 1 tablet (5 mg total) by mouth 2 (two) times daily. 180 tablet 1   colchicine 0.6 MG tablet Take 0.5 tablets (0.3 mg total) by mouth daily. 45 tablet 0   diphenhydrAMINE HCl (BENADRYL PO) Take 1 tablet by mouth daily as needed (itchyness).     diphenhydrAMINE-Zinc Acetate (BENADRYL ITCH RELIEF EX) Apply 1 application. topically daily as needed (itchyness).     docusate sodium (COLACE) 100 MG capsule Take 100 mg by mouth 2 (two) times daily.     Dulaglutide (TRULICITY) 1.5 ZJ/6.9CV SOPN Inject 1.5 mg into the skin every Wednesday. 0.5 mL 4   DULoxetine (CYMBALTA) 60 MG capsule Take 1 capsule (60 mg total) by mouth daily. 90 capsule 3   EPINEPHrine 0.3 mg/0.3 mL IJ SOAJ injection Inject 0.3 mg into the muscle as needed for anaphylaxis. 1 each 1   fluticasone (FLONASE) 50 MCG/ACT nasal spray Place 2 sprays into both nostrils daily. 16 g 6   glucose blood (ACCU-CHEK GUIDE) test strip Use to check blood sugar TID. E11.69 100  each 3   insulin aspart (NOVOLOG) 100 UNIT/ML injection Inject 2-10 Units into the skin 3 (three) times daily before meals. Per sliding scale     lidocaine (XYLOCAINE) 5 % ointment Apply 1 application. topically as needed for moderate pain. First choice is lidocaine patch, but if not covered/expensive, please fill ointment (Patient taking differently: Apply 1 application  topically 2 (two) times daily as needed for moderate pain.) 35.44 g 3   loratadine (CLARITIN) 10 MG tablet Take 1 tablet (10 mg total) by mouth daily. 30 tablet 11   metolazone (ZAROXOLYN) 2.5 MG tablet Take 1 tablet (2.5 mg total) by mouth 2 (two) times a week. Monday Friday. 8 tablet 11   metoprolol succinate (TOPROL-XL) 25 MG 24 hr tablet Take 1 tablet (25 mg total) by mouth in the  morning and at bedtime. 60 tablet 3   Multiple Vitamins-Minerals (MULTIVITAMIN WOMEN PO) Take 1 tablet by mouth daily.     nitroGLYCERIN (NITROSTAT) 0.4 MG SL tablet Place 1 tablet (0.4 mg total) under the tongue every 5 (five) minutes x 3 doses as needed for chest pain. 25 tablet 12   oxyCODONE (ROXICODONE) 5 MG immediate release tablet Take 1 tablet (5 mg total) by mouth every 6 (six) hours as needed for up to 15 doses for severe pain. 15 tablet 0   pantoprazole (PROTONIX) 40 MG tablet TAKE ONE TABLET BY MOUTH DAILY AT 9AM 90 tablet 0   potassium chloride SA (KLOR-CON M) 20 MEQ tablet Take 3 tablets (60 mEq total) by mouth daily. Take an additional 20 meq with every dose of METOLAZONE 180 tablet 3   RESTASIS 0.05 % ophthalmic emulsion Place 1 drop into both eyes 2 (two) times daily.  12   rosuvastatin (CRESTOR) 10 MG tablet Take 1 tablet (10 mg total) by mouth every evening.     torsemide (DEMADEX) 20 MG tablet Take 3 tablets (60 mg total) by mouth 2 (two) times daily. 180 tablet 0   No current facility-administered medications on file prior to visit.     Review of System: ROS  Objective:  There were no vitals taken for this visit.  There were no vitals filed for this visit.  Physical Exam:   General Appearance: Well nourished, in no apparent distress. Eyes: PERRLA, EOMs, conjunctiva no swelling or erythema Sinuses: No Frontal/maxillary tenderness ENT/Mouth: Ext aud canals clear, TMs without erythema, bulging. No erythema, swelling, or exudate on post pharynx.  Tonsils not swollen or erythematous. Hearing normal.  Neck: Supple, thyroid normal.  Respiratory: Respiratory effort normal, BS equal bilaterally without rales, rhonchi, wheezing or stridor.  Cardio: RRR with no MRGs. Brisk peripheral pulses without edema.  Abdomen: Soft, + BS.  Non tender, no guarding, rebound, hernias, masses. Lymphatics: Non tender without lymphadenopathy.  Musculoskeletal: Full ROM, 5/5 strength, normal  gait.  Skin: Warm, dry without rashes, lesions, ecchymosis.  Neuro: Cranial nerves intact. Normal muscle tone, no cerebellar symptoms. Sensation intact.  Psych: Awake and oriented X 3, normal affect, Insight and Judgment appropriate.    Assessment:   No diagnosis found.  No orders of the defined types were placed in this encounter.   This note has been created with Surveyor, quantity. Any transcriptional errors are unintentional.   [name], NP 02/06/2022, 9:21 AM

## 2022-02-06 NOTE — Telephone Encounter (Signed)
Pt aware and agreeable. Pt said she will go to [facility] emergency [facility].

## 2022-02-06 NOTE — ED Triage Notes (Addendum)
Sent by dr [email or web address] due to sob and fluid over. Hx CHF. Also complains of chest pain and right leg pain and a knot in right leg.

## 2022-02-07 ENCOUNTER — Encounter (HOSPITAL_COMMUNITY): Payer: Self-pay | Admitting: Emergency Medicine

## 2022-02-07 DIAGNOSIS — N184 Chronic kidney disease, stage 4 (severe): Secondary | ICD-10-CM

## 2022-02-07 DIAGNOSIS — I4892 Unspecified atrial flutter: Secondary | ICD-10-CM

## 2022-02-07 DIAGNOSIS — I509 Heart failure, unspecified: Secondary | ICD-10-CM | POA: Diagnosis not present

## 2022-02-07 DIAGNOSIS — I480 Paroxysmal atrial fibrillation: Secondary | ICD-10-CM

## 2022-02-07 DIAGNOSIS — I5082 Biventricular heart failure: Secondary | ICD-10-CM

## 2022-02-07 DIAGNOSIS — I5023 Acute on chronic systolic (congestive) heart failure: Secondary | ICD-10-CM

## 2022-02-07 LAB — SARS CORONAVIRUS 2 BY RT PCR: SARS Coronavirus 2 by RT PCR: NEGATIVE

## 2022-02-07 LAB — BASIC METABOLIC PANEL
Anion gap: 8 (ref 5–15)
BUN: 25 mg/dL — ABNORMAL HIGH (ref 8–23)
CO2: 27 mmol/L (ref 22–32)
Calcium: 9.4 mg/dL (ref 8.9–10.3)
Chloride: 107 mmol/L (ref 98–111)
Creatinine, Ser: 2.35 mg/dL — ABNORMAL HIGH (ref 0.44–1.00)
GFR, Estimated: 22 mL/min — ABNORMAL LOW (ref >60)
Glucose, Bld: 160 mg/dL — ABNORMAL HIGH (ref 70–99)
Potassium: 3.5 mmol/L (ref 3.5–5.1)
Sodium: 142 mmol/L (ref 135–145)

## 2022-02-07 LAB — TROPONIN I (HIGH SENSITIVITY): Troponin I (High Sensitivity): 6 ng/L (ref <18)

## 2022-02-07 MED ORDER — FUROSEMIDE 10 MG/ML IJ SOLN: 40.0000 mg | Freq: Once | INTRAMUSCULAR | Status: DC

## 2022-02-07 MED ORDER — POTASSIUM CHLORIDE CRYS ER 20 MEQ PO TBCR
40.0000 meq | EXTENDED_RELEASE_TABLET | Freq: Once | ORAL | Status: AC
Start: 2022-02-07 — End: 2022-02-07
  Administered 2022-02-07: 40 meq via ORAL
  Filled 2022-02-07: qty 2

## 2022-02-07 MED ORDER — POTASSIUM CHLORIDE CRYS ER 20 MEQ PO TBCR
40.0000 meq | EXTENDED_RELEASE_TABLET | Freq: Once | ORAL | Status: AC
  Administered 2022-02-07: 40 meq via ORAL
  Filled 2022-02-07: qty 2

## 2022-02-07 MED ORDER — FUROSEMIDE 10 MG/ML IJ SOLN
100.0000 mg | Freq: Once | INTRAVENOUS | Status: AC
  Administered 2022-02-07: 100 mg via INTRAVENOUS
  Filled 2022-02-07: qty 10

## 2022-02-07 MED ORDER — FUROSEMIDE 10 MG/ML IJ SOLN
120.0000 mg | Freq: Once | INTRAVENOUS | Status: AC
  Administered 2022-02-07: 120 mg via INTRAVENOUS
  Filled 2022-02-07: qty 10

## 2022-02-07 NOTE — Discharge Instructions (Signed)
If you develop recurrent, continued, or worsening chest pain, shortness of breath, fever, vomiting, abdominal or back pain, or any other new/concerning symptoms then return to the ER for evaluation.  

## 2022-02-07 NOTE — Consult Note (Addendum)
Advanced Heart Failure Team Consult Note   Primary Physician: Grayce Sessions, NP PCP-Cardiologist:  Jodelle Red, MD HF: Dr. Gala Romney  Reason for Consultation: Acute on chronic systolic CHF  HPI:    [name] is seen today for evaluation of acute on chronic systolic CHF at the request of Dr. Criss Alvine with Emergency Medicine.   66 y.o. with a history of morbid obesity, chronic diastolic and systolic HF with mildly reduced LVEF (45-50%), severe MR/TR, atrial fibrillation on Eliquis, HTN, and DMII.    She has a known history of HF with mildly reduced LVEF of 45-50% with cath in 2019 with mild, non-obstructive CAD. Also with history of difficult to control Afib with failed Tikosyn, later switched to amiodarone, dig, metop and apixaban for [facility].    Admitted 5//22 with worsening HF.  LHC 5/19 non-obstructive CAD. RHC 12/22/20 with elevated filling pressures and low CI.    Underwent MV/TV repair with Maze on 12/30/20. Post-operatively, she struggled  with low output progressive renal failure and volume overload.  Post operative Echo showed LVEF 55% RV moderately HK. Moderate TR. No significant MR. D/c wt was 281 lb. SCr was 2.8. She was also in Afib day of d/c.    Admitted 6/22 for A/CHF and AF diuresed with IV lasix, RHC showed low filling pressures and moderately reduced CO. She underwent successful DCCV 02/01/21. 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    Seen in ED 02/07/21 fShe was back in Afib, rate controlled. She was loaded w amiodarone gtt, successful DCCV on 02/09/21.    Back in AF 7/22. Repeat echo showed new reduction in EF, in setting of AFL with RVR,  EF 30-35%, moderate LVH, moderate RV dysfunction. Transitioned to po amio and torsemide, discharge weight 271 lbs.   S/p DCCV 04/19/21, but back in afib at follow up a week later at [facility] appointment.    Admitted 9/22 with CP and AKI on CKD IV. SCr 3.12 in ED, likely due to over diuresis. discharge, weight 268  lbs.   Admitted 1/23 x 2  with respiratory distress due to HF and COVID, Had recurrent AF. Echo EF 45%   Admitted 4/23 with recurrent HF in setting of recurrent AF. Echo 35-40% RV moderately down severe TR. RHC 11/24/21 notable for severely elevated biventricular pressures. Started on milrinone and IV lasix.   RA = 29 RV = 65/30 PA = 67/37 (45) PCW = 30 (v waves to 45) Fick cardiac output/index = 6.2/2.5 PVR = 2.4 WU Ao sat = 93% PA sat = 50%, 55% PAPi = 1.0    Underwent DC-CV to NSR on 11/25/21. Loaded with IV amio. Developed severe stuttering. Code Stroke called. CT/MRI normal. Resolved spontaneously. Developed AKI. Discharged home 12/07/21. Scr 2.9. Wt 295   Readmitted 05/30-06/05/23 with a/c CHF after failing escalation of diuretics in outpatient setting. Initially required lasix gtt which was later transitioned to PO torsemide 60 BID + 2.5 mg metolazone M, W, F. TEE during admit with severe biventricular dysfunction, stable MV prosthesis and severe TR.   At discharge she was referred to structural heart team for consideration of TV VIV. She saw Dr. Excell Seltzer 06/07. After discussion with multidisciplinary valve team and colleagues at tertiary center it was decided that given her significant left heart failure with severely elevated left heart failure she was not be a good candidate for valve in valve tricuspid replacement.  She was back in atrial flutter at f/u 06/08. Underwent DCCV on 06/13  to SR.   Scr up to 3.4 on 06/16. Had been called to decrease metolazone from 3 X week to twice a week. Continued same dose of Torsemide.  She was referred to ED yesterday for evaluation after she called HF clinic with worsening dyspnea and facial swelling. Maintaining SR on ECG. Weight 299 lb had been 291 lb at last f/u. Labs Scr 2.44, K 3.3,  BNP 550 (previously 375), HS troponin negative X 2. Given 100 mg lasix IV and patient reports she put out over 1L urine. Continues to have dyspnea and chest  heaviness.    Review of Systems: [y] = yes, [ ]  = no   General: Weight gain [Y]; Weight loss [ ] ; Anorexia [ ] ; Fatigue [ ] ; Fever [ ] ; Chills [ ] ; Weakness [Y]  Cardiac: Chest pain/pressure [Y ]; Resting SOB [ Y]; Exertional SOB [Y]; Orthopnea [Y]; Pedal Edema [Y]; Palpitations [ ] ; Syncope [ ] ; Presyncope [ ] ; Paroxysmal nocturnal dyspnea[ ]   Pulmonary: Cough [ ] ; Wheezing[ ] ; Hemoptysis[ ] ; Sputum [ ] ; Snoring [ ]   GI: Vomiting[ ] ; Dysphagia[ ] ; Melena[ ] ; Hematochezia [ ] ; Heartburn[ ] ; Abdominal pain [ ] ; Constipation [ ] ; Diarrhea [ ] ; BRBPR [ ]   GU: Hematuria[ ] ; Dysuria [ ] ; Nocturia[ ]   Vascular: Pain in legs with walking [ ] ; Pain in feet with lying flat [ ] ; Non-healing sores [ ] ; Stroke [ ] ; TIA [ ] ; Slurred speech [ ] ;  Neuro: Headaches[ ] ; Vertigo[ ] ; Seizures[ ] ; Paresthesias[ ] ;Blurred vision [ ] ; Diplopia [ ] ; Vision changes [ ]   Ortho/Skin: Arthritis [ ] ; Joint pain [ ] ; Muscle pain [ ] ; Joint swelling [ ] ; Back Pain [ ] ; Rash [ ]   Psych: Depression[ ] ; Anxiety[ ]   Heme: Bleeding problems [ ] ; Clotting disorders [ ] ; Anemia [ ]   Endocrine: Diabetes [Y]; Thyroid dysfunction[ ]   Home Medications Prior to Admission medications   Medication Sig Start Date End Date Taking? Authorizing Provider  acetaminophen (TYLENOL) 325 MG tablet Take 2 tablets (650 mg total) by mouth every 6 (six) hours as needed for mild pain or moderate pain. 12/07/21   Arrien, Jimmy Picket, MD  albuterol (VENTOLIN HFA) 108 (90 Base) MCG/ACT inhaler Inhale 1-2 puffs into the lungs every 6 (six) hours as needed for wheezing or shortness of breath. 12/29/21   [name], NP  Alcohol Swabs (B-D SINGLE USE SWABS REGULAR) PADS 1 each by Other route daily. 07/06/21   [name], NP  Alcohol Swabs PADS 1 application by Does not apply route in the morning, at noon, and at bedtime. 09/14/21   [name], NP  amiodarone (PACERONE) 200 MG tablet Take 1 tablet (200 mg total) by mouth daily.  09/27/21   Vickie Epley, MD  apixaban (ELIQUIS) 5 MG TABS tablet Take 1 tablet (5 mg total) by mouth 2 (two) times daily. 05/16/21   Rafael Bihari, FNP  blood glucose meter kit and supplies KIT Dispense based on patient and insurance preference. Use up to four times daily as directed. 02/10/21   Patrecia Pour, MD  blood glucose meter kit and supplies Dispense based on patient and insurance preference. Use up to four times daily as directed. (FOR ICD-10 E10.9, E11.9). 07/06/21   [name], NP  Blood Glucose Monitoring Suppl (ACCU-CHEK GUIDE ME) w/Device KIT Use as instructed to check blood sugar three times daily. E11.69 04/21/20   Charlott Rakes, MD  busPIRone (BUSPAR) 5 MG tablet Take 1 tablet (  5 mg total) by mouth 2 (two) times daily. 09/14/21   [name], NP  colchicine 0.6 MG tablet Take 0.5 tablets (0.3 mg total) by mouth daily. 12/08/21   Charlott Rakes, MD  diphenhydrAMINE HCl (BENADRYL PO) Take 1 tablet by mouth daily as needed (itchyness).    [provider]  diphenhydrAMINE-Zinc Acetate (BENADRYL ITCH RELIEF EX) Apply 1 application. topically daily as needed (itchyness).    [provider]  docusate sodium (COLACE) 100 MG capsule Take 100 mg by mouth 2 (two) times daily.    [provider]  Dulaglutide (TRULICITY) 1.5 WG/6.6ZL SOPN Inject 1.5 mg into the skin every Wednesday. 01/25/22   [name], NP  DULoxetine (CYMBALTA) 60 MG capsule Take 1 capsule (60 mg total) by mouth daily. 09/14/21   [name], NP  EPINEPHrine 0.3 mg/0.3 mL IJ SOAJ injection Inject 0.3 mg into the muscle as needed for anaphylaxis. 06/25/20   [name], NP  fluticasone (FLONASE) 50 MCG/ACT nasal spray Place 2 sprays into both nostrils daily. 12/29/21   [name], NP  glucose blood (ACCU-CHEK GUIDE) test strip Use to check blood sugar TID. E11.69 07/13/21   Charlott Rakes, MD  insulin aspart (NOVOLOG) 100 UNIT/ML injection Inject  2-10 Units into the skin 3 (three) times daily before meals. Per sliding scale 09/02/21   Aline August, MD  lidocaine (XYLOCAINE) 5 % ointment Apply 1 application. topically as needed for moderate pain. First choice is lidocaine patch, but if not covered/expensive, please fill ointment Patient taking differently: Apply 1 application  topically 2 (two) times daily as needed for moderate pain. 11/08/21   Buford Dresser, MD  loratadine (CLARITIN) 10 MG tablet Take 1 tablet (10 mg total) by mouth daily. 12/29/21   [name], NP  metolazone (ZAROXOLYN) 2.5 MG tablet Take 1 tablet (2.5 mg total) by mouth 2 (two) times a week. Monday Friday. 02/06/22   Joette Catching, PA-C  metoprolol succinate (TOPROL-XL) 25 MG 24 hr tablet Take 1 tablet (25 mg total) by mouth in the morning and at bedtime. 01/02/22   Fayth Trefry, Shaune Pascal, MD  Multiple Vitamins-Minerals (MULTIVITAMIN WOMEN PO) Take 1 tablet by mouth daily.    [provider]  nitroGLYCERIN (NITROSTAT) 0.4 MG SL tablet Place 1 tablet (0.4 mg total) under the tongue every 5 (five) minutes x 3 doses as needed for chest pain. 01/08/14   Charolette Forward, MD  oxyCODONE (ROXICODONE) 5 MG immediate release tablet Take 1 tablet (5 mg total) by mouth every 6 (six) hours as needed for up to 15 doses for severe pain. 02/01/22   Wyvonnia Dusky, MD  pantoprazole (PROTONIX) 40 MG tablet TAKE ONE TABLET BY MOUTH DAILY AT 9AM 01/20/22   [name], NP  potassium chloride SA (KLOR-CON M) 20 MEQ tablet Take 3 tablets (60 mEq total) by mouth daily. Take an additional 20 meq with every dose of METOLAZONE 01/27/22   Joette Catching, PA-C  RESTASIS 0.05 % ophthalmic emulsion Place 1 drop into both eyes 2 (two) times daily. 11/19/17   [provider]  rosuvastatin (CRESTOR) 10 MG tablet Take 1 tablet (10 mg total) by mouth every evening. 09/02/21   Aline August, MD  torsemide (DEMADEX) 20 MG tablet Take 3 tablets (60 mg total) by  mouth 2 (two) times daily. 01/23/22   Patrecia Pour, MD    Past Medical History: Past Medical History:  Diagnosis Date   Acute on chronic systolic  CHF (congestive heart failure) (HCC) 12/21/2020   Allergic rhinitis    Arthritis    Asthma    Chronic diastolic CHF (congestive heart failure) (HCC)    COPD (chronic obstructive pulmonary disease) (HCC)    Depression    DM (diabetes mellitus) ([place])    DVT (deep vein thrombosis) in pregnancy    GERD (gastroesophageal reflux disease)    HTN (hypertension)    Hyperlipidemia    Obesity    Pneumonia 04/2018   RIGHT LOBE   Sleep apnea    compliant with CPAP    Past Surgical History: Past Surgical History:  Procedure Laterality Date   ABDOMINAL HYSTERECTOMY     BUBBLE STUDY  11/26/2020   Procedure: BUBBLE STUDY;  Surgeon: Buford Dresser, MD;  Location: [place];  Service: Cardiovascular;;   CARDIOVERSION N/A 05/06/2020   Procedure: CARDIOVERSION;  Surgeon: Buford Dresser, MD;  Location: [facility] ENDOSCOPY;  Service: Cardiovascular;  Laterality: N/A;   CARDIOVERSION N/A 11/04/2020   Procedure: CARDIOVERSION;  Surgeon: Lelon Perla, MD;  Location: [place];  Service: Cardiovascular;  Laterality: N/A;   CARDIOVERSION N/A 02/01/2021   Procedure: CARDIOVERSION;  Surgeon: Jolaine Artist, MD;  Location: [place];  Service: Cardiovascular;  Laterality: N/A;   CARDIOVERSION N/A 02/09/2021   Procedure: CARDIOVERSION;  Surgeon: Jolaine Artist, MD;  Location: [place];  Service: Cardiovascular;  Laterality: N/A;   CARDIOVERSION N/A 04/19/2021   Procedure: CARDIOVERSION;  Surgeon: Fay Records, MD;  Location: [place];  Service: Cardiovascular;  Laterality: N/A;   CARDIOVERSION N/A 11/25/2021   Procedure: CARDIOVERSION;  Surgeon: Jolaine Artist, MD;  Location: [place];  Service: Cardiovascular;  Laterality: N/A;   CARDIOVERSION N/A 01/31/2022   Procedure: CARDIOVERSION;  Surgeon: Jolaine Artist, MD;   Location: [place];  Service: Cardiovascular;  Laterality: N/A;   CATARACT EXTRACTION, BILATERAL     CHOLECYSTECTOMY     COLONOSCOPY WITH PROPOFOL N/A 03/05/2015   Procedure: COLONOSCOPY WITH PROPOFOL;  Surgeon: Carol Ada, MD;  Location: [facility] ENDOSCOPY;  Service: Endoscopy;  Laterality: N/A;   DIAGNOSTIC LAPAROSCOPY     ingrown hallux Left    KNEE SURGERY     LEFT HEART CATH AND CORONARY ANGIOGRAPHY N/A 12/31/2017   Procedure: LEFT HEART CATH AND CORONARY ANGIOGRAPHY;  Surgeon: Charolette Forward, MD;  Location: [place] CV LAB;  Service: Cardiovascular;  Laterality: N/A;   MAZE N/A 12/29/2020   Procedure: MAZE;  Surgeon: [name], MD;  Location: [place];  Service: Open Heart Surgery;  Laterality: N/A;   MITRAL VALVE REPAIR N/A 12/29/2020   Procedure: MITRAL VALVE REPAIR USING CARBOMEDICS ANNULOFLEX RING SIZE 30;  Surgeon: [name], MD;  Location: [place];  Service: Open Heart Surgery;  Laterality: N/A;   RIGHT HEART CATH N/A 12/22/2020   Procedure: RIGHT HEART CATH;  Surgeon: Larey Dresser, MD;  Location: [place] CV LAB;  Service: Cardiovascular;  Laterality: N/A;   RIGHT HEART CATH N/A 01/31/2021   Procedure: RIGHT HEART CATH;  Surgeon: Jolaine Artist, MD;  Location: [place] CV LAB;  Service: Cardiovascular;  Laterality: N/A;   RIGHT HEART CATH N/A 07/29/2021   Procedure: RIGHT HEART CATH;  Surgeon: Jolaine Artist, MD;  Location: [place] CV LAB;  Service: Cardiovascular;  Laterality: N/A;   RIGHT HEART CATH N/A 11/24/2021   Procedure: RIGHT HEART CATH;  Surgeon: Jolaine Artist, MD;  Location: [place] CV LAB;  Service: Cardiovascular;  Laterality: N/A;   TEE WITHOUT CARDIOVERSION  N/A 11/26/2020   Procedure: TRANSESOPHAGEAL ECHOCARDIOGRAM (TEE);  Surgeon: Buford Dresser, MD;  Location: [facility] ENDOSCOPY;  Service: Cardiovascular;  Laterality: N/A;   TEE WITHOUT CARDIOVERSION N/A 12/29/2020   Procedure: TRANSESOPHAGEAL ECHOCARDIOGRAM (TEE);   Surgeon: [name], MD;  Location: [place];  Service: Open Heart Surgery;  Laterality: N/A;   TEE WITHOUT CARDIOVERSION N/A 01/20/2022   Procedure: TRANSESOPHAGEAL ECHOCARDIOGRAM (TEE);  Surgeon: Jolaine Artist, MD;  Location: [place];  Service: Cardiovascular;  Laterality: N/A;   TRICUSPID VALVE REPLACEMENT N/A 12/29/2020   Procedure: 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 RING SIZE 34;  Surgeon: [name], MD;  Location: [place];  Service: Open Heart Surgery;  Laterality: N/A;    Family History: Family History  Problem Relation Age of Onset   Breast cancer Mother    Cancer Mother    Hypertension Mother    Cancer Father    Hypertension Sister    Hypertension Brother    CAD Other     Social History: Social History   Socioeconomic History   Marital status: Significant Other    Spouse name: Not on file   Number of children: 2   Years of education: Not on file   Highest education level: Some college, no degree  Occupational History   Not on file  Tobacco Use   Smoking status: Never   Smokeless tobacco: Never  Vaping Use   Vaping Use: Never used  Substance and Sexual Activity   Alcohol use: No   Drug use: No   Sexual activity: Not Currently  Other Topics Concern   Not on file  Social History Narrative   Pt lives in [place] with spouse.  2 grown children.   Previously worked in [employer] at [employer].  Now on disability   Social Determinants of Health   Financial Resource Strain: Low Risk  (01/19/2022)   Overall Financial Resource Strain (CARDIA)    Difficulty of Paying Living Expenses: Not hard at all  Food Insecurity: No Food Insecurity (01/19/2022)   Hunger Vital Sign    Worried About Running Out of Food in the Last Year: Never true    Ran Out of Food in the Last Year: Never true  Transportation Needs: No Transportation Needs (01/19/2022)   PRAPARE - Hydrologist (Medical): No    Lack of Transportation  (Non-Medical): No  Physical Activity: Insufficiently Active (10/28/2021)   Exercise Vital Sign    Days of Exercise per Week: 2 days    Minutes of Exercise per Session: 70 min  Stress: No Stress Concern Present (10/28/2021)   [place]    Feeling of Stress : Not at all  Social Connections: Socially Isolated (10/28/2021)   Social Connection and Isolation Panel [NHANES]    Frequency of Communication with Friends and Family: More than three times a week    Frequency of Social Gatherings with Friends and Family: Three times a week    Attends Religious Services: Never    Active Member of Clubs or Organizations: No    Attends Archivist Meetings: Never    Marital Status: Widowed    Allergies:  Allergies  Allergen Reactions   Bee Pollen Anaphylaxis   Sulfa Antibiotics Itching    Objective:    Vital Signs:   Temp:  [98.6 F (37 C)] 98.6 F (37 C) (06/19 1801) Pulse Rate:  [67-91] 84 (06/20 0815) Resp:  [15-23] 23 (  06/20 0815) BP: (109-152)/(67-117) 140/85 (06/20 0815) SpO2:  [96 %-100 %] 100 % (06/20 0815) Weight:  [135.6 kg] 135.6 kg (06/19 1801)    Weight change: Filed Weights   02/06/22 1801  Weight: 135.6 kg    Intake/Output:   Intake/Output Summary (Last 24 hours) at 02/07/2022 0859 Last data filed at 02/07/2022 9323 Gross per 24 hour  Intake 58.02 ml  Output --  Net 58.02 ml      Physical Exam    General:  Well appearing. No resp difficulty HEENT: normal Neck: supple. JVP . Carotids 2+ bilat; no bruits. No lymphadenopathy or thyromegaly appreciated. Cor: PMI nondisplaced. Regular rate & rhythm. No rubs, gallops or murmurs. Lungs: clear Abdomen: soft, nontender, nondistended. No hepatosplenomegaly. No bruits or masses. Good bowel sounds. Extremities: no cyanosis, clubbing, rash, edema Neuro: alert & orientedx3, cranial nerves grossly intact. moves all 4 extremities w/o difficulty.  Affect pleasant   Telemetry   SR 80s  EKG    SR 90 bpm, 1st degree AVB, PVCs  Labs   Basic Metabolic Panel: Recent Labs  Lab 02/01/22 1245 02/03/22 0916 02/06/22 1809  NA 141 143 141  K 3.9 3.6 3.3*  CL 103 105 106  CO2 $Re'26 27 25  'WHL$ GLUCOSE 166* 127* 100*  BUN 43* 46* 28*  CREATININE 3.21* 3.47* 2.44*  CALCIUM 10.0 9.4 9.2    Liver Function Tests: Recent Labs  Lab 02/06/22 1809  AST 27  ALT 21  ALKPHOS 145*  BILITOT 1.5*  PROT 8.0  ALBUMIN 3.3*   No results for input(s): "LIPASE", "AMYLASE" in the last 168 hours. No results for input(s): "AMMONIA" in the last 168 hours.  CBC: Recent Labs  Lab 02/01/22 1245 02/06/22 1809  WBC 5.6 5.7  HGB 8.5* 8.8*  HCT 28.6* 28.4*  MCV 86.9 85.8  PLT 196 206    Cardiac Enzymes: No results for input(s): "CKTOTAL", "CKMB", "CKMBINDEX", "TROPONINI" in the last 168 hours.  BNP: BNP (last 3 results) Recent Labs    01/17/22 1350 01/26/22 1156 02/06/22 1809  BNP 292.6* 375.6* 550.8*    ProBNP (last 3 results) No results for input(s): "PROBNP" in the last 8760 hours.   CBG: No results for input(s): "GLUCAP" in the last 168 hours.  Coagulation Studies: No results for input(s): "LABPROT", "INR" in the last 72 hours.   Imaging   DG Chest 2 View  Result Date: 02/06/2022 CLINICAL DATA:  Shortness of breath EXAM: CHEST - 2 VIEW COMPARISON:  Chest x-ray 02/01/2022 FINDINGS: Cardiomegaly and mediastinum appear unchanged. Cardiac surgical changes and median sternotomy wires. Slight pulmonary vascular prominence. No focal consolidation identified. No pleural effusion or pneumothorax. IMPRESSION: Cardiomegaly and minimal pulmonary vascular prominence. Electronically Signed   By: Ofilia Neas M.D.   On: 02/06/2022 18:54     Medications:     Current Medications:   Infusions:     Assessment/Plan   1. Acute on chronic Biventricular Systolic Heart Failure: - RHC (6/22) with low filling pressures with  moderately reduced CO. - Echo (7/22) in setting of AFL with RVR, showed EF 30-35%, moderate LVH, moderate RV dysfunction, PASP 48, s/p MV repair with mild MR and mean gradient 10 mmHg with HR around 130, s/p TV repair with mild-moderate TR, dilated IVC.   - RHC 4/23 markedly elevated biventricular pressures - Echo 4/23 35-40% RV moderately down severe TR. Moderate MS mean grad 89mm HG - TEE 06/02: Severe biventricular dysfunction, trivial MR, mean gradient 3 across MV prosthesis, severe TR -  Multiple admissions recently for HF mostly occur in setting of recurrent AF. Currently in NSR. - NYHA IIIb/IV. Does not appear significantly volume overloaded but weight up 8 lb and BNP elevated from prior. Given 100 mg lasix IV with good response. Will give 120 mg lasix IV. If voids > 500 cc she can discharge home today.  - Would continue Torsemide 60 BID and increase metolazone 2.5 mg back to three times weekly (M, W, F) at discharge - In and out of AFL at recent visits. Maintaining SR today - GDMT limited by CKD.  - Can consider bidil in future if BP allows - Continue toprol xl 25 mg bid - Worry may have end-stage disease. At some point, HD may be only way to manage volume status.   2. PAF/AFL - She has failed Tikosyn. - She had Maze in 5/22 and DCCV in 6/22 & 04/19/21, last of which she had ERAF. - Multiple admissions recently for HF mostly occur in setting of recurrent AF/AFL. Last DCCV 06/13.  - Currently maintaining SR - Continue amiodarone + Eliquis  - Unable to tolerate AF at all. Follows with EP. Not a candidate for Afib ablation or AV nodal ablation/PPM - Has OSA, awaiting split night study for CPAP titration.    3. CKD Stage IV   - New baseline creatinine  ~ 2.6 - 2.8. Stabilized around 3.1 during recent admit - Scr 2.35 today - No SGLT2i, GFR has been consistently less than 20 - Follows with Nephrology   4. CAD - Cath (2019): Mild nonobstructive CAD -  No chest pain.  - Continue statin  + beta blocker - no ASA w/ Eliquis    5. Valvular Heart Disease: - S/p MV and TV repair in 5/22.  - Echo 02/25/21 with mild-moderate TR.   - Echo 4/23 35-40% RV moderately down. severe TR. Moderate MS mean gradient 13mmHG  - TEE 06/23: severe biventricular dysfunction, trivial MR, mean gradient 3 across MV prosthesis, severe TR - Seen by structural heart team. Not a candidate for valve in valve tricuspid replacement d/t severe left heart failure   6. Obesity - Body mass index is 46.75 kg/m. - Discussed portion control.    7. DMII - Hgb A1c 6.7% 04/23 - GFR has been too low for SGLT2i   8. OSA - initial sleep study positive but was awaiting split night study to titrate CPAP - Has been referred to Dr. Radford Pax - suspect this is part of her AF recurrence   9. PVCs - Occasional - Already on PO amiodarone for AF/AFL   Okay for discharge this afternoon if able to void > 500 cc after receives 2nd dose IV lasix.  Has upcoming f/u with her regular cardiologist Dr. Harrell Gave and HF clinic.  Discharge diuretics Torsemide 60 BID Metolazone recently decreased to twice weekly M, F. Increase back to three times weekly, M, W, F.  Length of Stay: 0  FINCH, LINDSAY N, PA-C  02/07/2022, 8:59 AM  Advanced Heart Failure Team Pager [phone] (M-F; 7a - 5p)  Please contact [facility] for night-coverage after hours (4p -7a ) and weekends on [email or web address]   Patient seen and examined with the above-signed Advanced Practice Provider and/or Housestaff. I personally reviewed laboratory data, imaging studies and relevant notes. I independently examined the patient and formulated the important aspects of the plan. I have edited the note to reflect any of my changes or salient points. I have personally discussed the plan with the patient and/or family.  66 y/o with history as above. Presents to ER with volume overload, CP and recent mechanical fall.  Has responded well to IV diuretics this am. Remains  in NSR. No evidence serious injury with fall. Weight not too far from recent baseline. Hstrop 6  General:  Obese woman No resp difficulty HEENT: normal Neck: supple. JVP 7-8 . Carotids 2+ bilat; no bruits. No lymphadenopathy or thryomegaly appreciated. Cor: PMI nondisplaced. Regular rate & rhythm. No rubs, gallops or murmurs. Lungs: clear Abdomen: obese soft, nontender, nondistended. No hepatosplenomegaly. No bruits or masses. Good bowel sounds. Extremities: no cyanosis, clubbing, rash, tr edema Neuro: alert & orientedx3, cranial nerves grossly intact. moves all 4 extremities w/o difficulty. Affect pleasant  I think she is fairly close to her baseline. Would give one more dose of IV lasix and then I think she would be stable for d/c from rhe ER with close outpatient f/u. Agree with plan to increase metolazone back to MWF. I have reviewed her case extensively with the Structural Heart team who reviewed with [facility] team as well and not felt to be candidate for TV intervention.   Glori Bickers, MD  5:47 PM

## 2022-02-07 NOTE — ED Provider Notes (Signed)
10:16 AM gust case with Dr. [name].  He is going to order another dose of Lasix with some potassium and if she urinates around 500 cc she can go home with follow-up as an outpatient.  Patient has urinated over 600 cc. Feels well for d/c. D/c with return precautions.    Sherwood Gambler, MD 02/07/22 1336

## 2022-02-07 NOTE — ED Provider Notes (Signed)
[facility] EMERGENCY DEPARTMENT Provider Note   CSN: [id number] Arrival date & time: 02/06/22  1734     History  Chief Complaint  Patient presents with   Shortness of [name] is a 66 y.o. female.  The history is provided by the patient.  Shortness of Breath Severity:  Moderate Onset quality:  Gradual Duration:  6 days Timing:  Constant Progression:  Worsening Chronicity:  Recurrent Context: not URI   Relieved by:  Nothing Worsened by:  Nothing Ineffective treatments:  Diuretics (torsemide) Associated symptoms: chest pain   Associated symptoms: no fever and no swollen glands   Associated symptoms comment:  Total body edema and weight gain  Risk factors: no recent surgery   Patient with CHF presents with complaints of anasarca and SOB and chest tightness x 6 days.  States she called cardiology who told her to come in to be evaluated.       Home Medications Prior to Admission medications   Medication Sig Start Date End Date Taking? Authorizing Provider  acetaminophen (TYLENOL) 325 MG tablet Take 2 tablets (650 mg total) by mouth every 6 (six) hours as needed for mild pain or moderate pain. 12/07/21   Arrien, Jimmy Picket, MD  albuterol (VENTOLIN HFA) 108 (90 Base) MCG/ACT inhaler Inhale 1-2 puffs into the lungs every 6 (six) hours as needed for wheezing or shortness of breath. 12/29/21   [name], NP  Alcohol Swabs (B-D SINGLE USE SWABS REGULAR) PADS 1 each by Other route daily. 07/06/21   [name], NP  Alcohol Swabs PADS 1 application by Does not apply route in the morning, at noon, and at bedtime. 09/14/21   [name], NP  amiodarone (PACERONE) 200 MG tablet Take 1 tablet (200 mg total) by mouth daily. 09/27/21   Vickie Epley, MD  apixaban (ELIQUIS) 5 MG TABS tablet Take 1 tablet (5 mg total) by mouth 2 (two) times daily. 05/16/21   Rafael Bihari, FNP  blood glucose meter kit and supplies KIT  Dispense based on patient and insurance preference. Use up to four times daily as directed. 02/10/21   Patrecia Pour, MD  blood glucose meter kit and supplies Dispense based on patient and insurance preference. Use up to four times daily as directed. (FOR ICD-10 E10.9, E11.9). 07/06/21   [name], NP  Blood Glucose Monitoring Suppl (ACCU-CHEK GUIDE ME) w/Device KIT Use as instructed to check blood sugar three times daily. E11.69 04/21/20   Charlott Rakes, MD  busPIRone (BUSPAR) 5 MG tablet Take 1 tablet (5 mg total) by mouth 2 (two) times daily. 09/14/21   [name], NP  colchicine 0.6 MG tablet Take 0.5 tablets (0.3 mg total) by mouth daily. 12/08/21   Charlott Rakes, MD  diphenhydrAMINE HCl (BENADRYL PO) Take 1 tablet by mouth daily as needed (itchyness).    [provider]  diphenhydrAMINE-Zinc Acetate (BENADRYL ITCH RELIEF EX) Apply 1 application. topically daily as needed (itchyness).    [provider]  docusate sodium (COLACE) 100 MG capsule Take 100 mg by mouth 2 (two) times daily.    [provider]  Dulaglutide (TRULICITY) 1.5 XL/2.4MW SOPN Inject 1.5 mg into the skin every Wednesday. 01/25/22   [name], NP  DULoxetine (CYMBALTA) 60 MG capsule Take 1 capsule (60 mg total) by mouth daily. 09/14/21   [name], NP  EPINEPHrine 0.3 mg/0.3 mL IJ SOAJ injection Inject 0.3 mg  into the muscle as needed for anaphylaxis. 06/25/20   [name], NP  fluticasone (FLONASE) 50 MCG/ACT nasal spray Place 2 sprays into both nostrils daily. 12/29/21   [name], NP  glucose blood (ACCU-CHEK GUIDE) test strip Use to check blood sugar TID. E11.69 07/13/21   Charlott Rakes, MD  insulin aspart (NOVOLOG) 100 UNIT/ML injection Inject 2-10 Units into the skin 3 (three) times daily before meals. Per sliding scale 09/02/21   Aline August, MD  lidocaine (XYLOCAINE) 5 % ointment Apply 1 application. topically as needed for moderate pain.  First choice is lidocaine patch, but if not covered/expensive, please fill ointment Patient taking differently: Apply 1 application  topically 2 (two) times daily as needed for moderate pain. 11/08/21   Buford Dresser, MD  loratadine (CLARITIN) 10 MG tablet Take 1 tablet (10 mg total) by mouth daily. 12/29/21   [name], NP  metolazone (ZAROXOLYN) 2.5 MG tablet Take 1 tablet (2.5 mg total) by mouth 2 (two) times a week. Monday Friday. 02/06/22   Joette Catching, PA-C  metoprolol succinate (TOPROL-XL) 25 MG 24 hr tablet Take 1 tablet (25 mg total) by mouth in the morning and at bedtime. 01/02/22   Bensimhon, Shaune Pascal, MD  Multiple Vitamins-Minerals (MULTIVITAMIN WOMEN PO) Take 1 tablet by mouth daily.    [provider]  nitroGLYCERIN (NITROSTAT) 0.4 MG SL tablet Place 1 tablet (0.4 mg total) under the tongue every 5 (five) minutes x 3 doses as needed for chest pain. 01/08/14   Charolette Forward, MD  oxyCODONE (ROXICODONE) 5 MG immediate release tablet Take 1 tablet (5 mg total) by mouth every 6 (six) hours as needed for up to 15 doses for severe pain. 02/01/22   Wyvonnia Dusky, MD  pantoprazole (PROTONIX) 40 MG tablet TAKE ONE TABLET BY MOUTH DAILY AT 9AM 01/20/22   [name], NP  potassium chloride SA (KLOR-CON M) 20 MEQ tablet Take 3 tablets (60 mEq total) by mouth daily. Take an additional 20 meq with every dose of METOLAZONE 01/27/22   Joette Catching, PA-C  RESTASIS 0.05 % ophthalmic emulsion Place 1 drop into both eyes 2 (two) times daily. 11/19/17   [provider]  rosuvastatin (CRESTOR) 10 MG tablet Take 1 tablet (10 mg total) by mouth every evening. 09/02/21   Aline August, MD  torsemide (DEMADEX) 20 MG tablet Take 3 tablets (60 mg total) by mouth 2 (two) times daily. 01/23/22   Patrecia Pour, MD      Allergies    Bee pollen and Sulfa antibiotics    Review of Systems   Review of Systems  Constitutional:  Negative for fever.  Eyes:  Negative  for redness.  Respiratory:  Positive for shortness of breath.   Cardiovascular:  Positive for chest pain. Negative for leg swelling.  All other systems reviewed and are negative.   Physical Exam Updated Vital Signs BP (!) 142/82   Pulse 91   Temp 98.6 F (37 C) (Oral)   Resp 15   Ht _0  (1.702 m)   Wt 135.6 kg   SpO2 96%   BMI 46.83 kg/m  Physical Exam Vitals and nursing note reviewed.  Constitutional:      General: She is not in acute distress.    Appearance: She is well-developed.  HENT:     Head: Normocephalic and atraumatic.     Nose: Nose normal.  Eyes:     Pupils: Pupils are equal, round, and reactive  to light.  Cardiovascular:     Rate and Rhythm: Normal rate and regular rhythm.     Pulses: Normal pulses.     Heart sounds: Normal heart sounds.  Pulmonary:     Effort: Pulmonary effort is normal. No respiratory distress.     Breath sounds: No wheezing or rhonchi.  Abdominal:     General: Bowel sounds are normal. There is no distension.     Palpations: Abdomen is soft.     Tenderness: There is no abdominal tenderness. There is no guarding or rebound.  Genitourinary:    Vagina: No vaginal discharge.  Musculoskeletal:        General: Normal range of motion.     Cervical back: Neck supple.     Comments: Pedal edema B  Skin:    General: Skin is warm and dry.     Capillary Refill: Capillary refill takes less than 2 seconds.     Findings: No erythema or rash.  Neurological:     General: No focal deficit present.     Mental Status: She is oriented to person, place, and time.     Deep Tendon Reflexes: Reflexes normal.  Psychiatric:        Mood and Affect: Mood normal.        Behavior: Behavior normal.     ED Results / Procedures / Treatments   Labs (all labs ordered are listed, but only abnormal results are displayed) Results for orders placed or performed during the hospital encounter of 02/06/22  SARS Coronavirus 2 by RT PCR (hospital order, performed in  [facility] hospital lab) *cepheid single result test* Anterior Nasal Swab   Specimen: Anterior Nasal Swab  Result Value Ref Range   SARS Coronavirus 2 by RT PCR NEGATIVE NEGATIVE  CBC  Result Value Ref Range   WBC 5.7 4.0 - 10.5 K/uL   RBC 3.31 (L) 3.87 - 5.11 MIL/uL   Hemoglobin 8.8 (L) 12.0 - 15.0 g/dL   HCT 28.4 (L) 36.0 - 46.0 %   MCV 85.8 80.0 - 100.0 fL   MCH 26.6 26.0 - 34.0 pg   MCHC 31.0 30.0 - 36.0 g/dL   RDW 15.5 11.5 - 15.5 %   Platelets 206 150 - 400 K/uL   nRBC 0.0 0.0 - 0.2 %  Comprehensive metabolic panel  Result Value Ref Range   Sodium 141 135 - 145 mmol/L   Potassium 3.3 (L) 3.5 - 5.1 mmol/L   Chloride 106 98 - 111 mmol/L   CO2 25 22 - 32 mmol/L   Glucose, Bld 100 (H) 70 - 99 mg/dL   BUN 28 (H) 8 - 23 mg/dL   Creatinine, Ser 2.44 (H) 0.44 - 1.00 mg/dL   Calcium 9.2 8.9 - 10.3 mg/dL   Total Protein 8.0 6.5 - 8.1 g/dL   Albumin 3.3 (L) 3.5 - 5.0 g/dL   AST 27 15 - 41 U/L   ALT 21 0 - 44 U/L   Alkaline Phosphatase 145 (H) 38 - 126 U/L   Total Bilirubin 1.5 (H) 0.3 - 1.2 mg/dL   GFR, Estimated 21 (L) >60 mL/min   Anion gap 10 5 - 15  Brain natriuretic peptide  Result Value Ref Range   B Natriuretic Peptide 550.8 (H) 0.0 - 100.0 pg/mL  Troponin I (High Sensitivity)  Result Value Ref Range   Troponin I (High Sensitivity) 7 <18 ng/L  Troponin I (High Sensitivity)  Result Value Ref Range   Troponin I (High Sensitivity) 6 <  18 ng/L   DG Chest 2 View  Result Date: 02/06/2022 CLINICAL DATA:  Shortness of breath EXAM: CHEST - 2 VIEW COMPARISON:  Chest x-ray 02/01/2022 FINDINGS: Cardiomegaly and mediastinum appear unchanged. Cardiac surgical changes and median sternotomy wires. Slight pulmonary vascular prominence. No focal consolidation identified. No pleural effusion or pneumothorax. IMPRESSION: Cardiomegaly and minimal pulmonary vascular prominence. Electronically Signed   By: Ofilia Neas M.D.   On: 02/06/2022 18:54   CT Head Wo Contrast  Result Date:  02/01/2022 CLINICAL DATA:  Head trauma, fall yesterday EXAM: CT HEAD WITHOUT CONTRAST TECHNIQUE: Contiguous axial images were obtained from the base of the skull through the vertex without intravenous contrast. RADIATION DOSE REDUCTION: This exam was performed according to the departmental dose-optimization program which includes automated exposure control, adjustment of the mA and/or kV according to patient size and/or use of iterative reconstruction technique. COMPARISON:  11/28/2021 FINDINGS: Brain: No evidence of acute infarction, hemorrhage, hydrocephalus, extra-axial collection or mass lesion/mass effect. Vascular: No hyperdense vessel or unexpected calcification. Skull: Hyperostosis frontalis. Negative for fracture or focal lesion. Sinuses/Orbits: No acute finding. Other: None. IMPRESSION: No acute intracranial pathology. Electronically Signed   By: Delanna Ahmadi M.D.   On: 02/01/2022 15:57   DG Ankle Complete Right  Result Date: 02/01/2022 CLINICAL DATA:  Fall down steps.  Right ankle injury and pain. EXAM: RIGHT ANKLE - COMPLETE 3+ VIEW COMPARISON:  None Available. FINDINGS: There is no evidence of acute fracture, dislocation, or joint effusion. A well corticated ossific density is seen adjacent to the lateral malleolus, which may represent accessory ossicle or old avulsion injury. Prominent dorsal and plantar calcaneal bone spurs are incidentally noted. IMPRESSION: No acute findings. Prominent dorsal and plantar calcaneal bone spurs. Electronically Signed   By: Marlaine Hind M.D.   On: 02/01/2022 15:36   DG Chest Port 1 View  Result Date: 02/01/2022 CLINICAL DATA:  Right-sided chest pain EXAM: PORTABLE CHEST 1 VIEW COMPARISON:  01/17/2022 and CT chest 11/21/2021. FINDINGS: Trachea is midline. Heart is enlarged, stable. Left atrial appendage clip is in place. Tricuspid and mitral valve replacements. Streaky atelectasis in the left lower lobe. Lungs are otherwise clear. No pleural fluid. IMPRESSION:  No acute findings. Electronically Signed   By: Lorin Picket M.D.   On: 02/01/2022 13:04   ECHO TEE  Result Date: 01/20/2022    TRANSESOPHOGEAL ECHO REPORT   Patient Name:   [name] Date of Exam: 01/20/2022 Medical Rec #:  [id number]      Height:       67.0 in Accession #:    [id number]     Weight:       308.0 lb Date of Birth:  [birth year]      BSA:          2.430 m Patient Age:    89 years       BP:           122/58 mmHg Patient Gender: F              HR:           63 bpm. Exam Location:  Inpatient Procedure: Transesophageal Echo, Color Doppler, Cardiac Doppler and 3D Echo Indications:     Tricuspid regurgitation  History:         Patient has prior history of Echocardiogram examinations, most                  recent 11/22/2021. CHF, Mitral Valve Disease and TV Disease; Risk  Factors:Sleep Apnea, Dyslipidemia, Diabetes and Hypertension.                  30 mm CARBOMEDICS ANNULOFLEX RING prosthetic annuloplasty ring                  present in the mitral position. Procedure Date: 12/29/20.                  Tricuspid annuloplasty repair. The tricuspid valve is has been                  repaired/replaced.  Sonographer:     Eartha Inch Sonographer#2:   Clayton Lefort RDCS (AE) Referring Phys:  [id number] AMY D CLEGG Diagnosing Phys: Glori Bickers MD PROCEDURE: After discussion of the risks and benefits of a TEE, an informed consent was obtained from the patient. The transesophogeal probe was passed without difficulty through the esophogus of the patient. Sedation performed by different physician. The patient was monitored while under deep sedation. Anesthestetic sedation was provided intravenously by Anesthesiology: 300.22m of Propofol, 461mof Lidocaine. Image quality was good. The patient developed no complications during the procedure. IMPRESSIONS  1. Left ventricular ejection fraction, by estimation, is 55%. The left ventricle has severely decreased function. The left ventricle has no regional  wall motion abnormalities.  2. Right ventricular systolic function is severely reduced. The right ventricular size is severely enlarged.  3. Left atrial size was severely dilated. No left atrial/left atrial appendage thrombus was detected.  4. Right atrial size was severely dilated.  5. The mitral valve has been repaired/replaced. Trivial mitral valve regurgitation. Mild mitral stenosis. The mean mitral valve gradient is 3.0 mmHg.  6. The RA and RV are severely dilated. The tricuspid valve leaflets do no coapt. The tricuspid valve is has been repaired/replaced. The tricuspid valve is status post repair with an annuloplasty ring. Tricuspid valve regurgitation is severe.  7. The aortic valve is tricuspid. Aortic valve regurgitation is not visualized. FINDINGS  Left Ventricle: Left ventricular ejection fraction, by estimation, is 55%. The left ventricle has severely decreased function. The left ventricle has no regional wall motion abnormalities. The left ventricular internal cavity size was normal in size. Right Ventricle: The right ventricular size is severely enlarged. No increase in right ventricular wall thickness. Right ventricular systolic function is severely reduced. Left Atrium: Left atrial size was severely dilated. No left atrial/left atrial appendage thrombus was detected. Right Atrium: Right atrial size was severely dilated. Pericardium: There is no evidence of pericardial effusion. Mitral Valve: The mitral valve has been repaired/replaced. Trivial mitral valve regurgitation. There is a bioprosthetic valve present in the mitral position. Mild mitral valve stenosis. MV peak gradient, 14.1 mmHg. The mean mitral valve gradient is 3.0 mmHg. Tricuspid Valve: The RA and RV are severely dilated. The tricuspid valve leaflets do no coapt. The tricuspid valve is has been repaired/replaced. Tricuspid valve regurgitation is severe. No evidence of tricuspid stenosis. The tricuspid valve is status post repair with an  annuloplasty ring. Aortic Valve: The aortic valve is tricuspid. Aortic valve regurgitation is not visualized. Pulmonic Valve: The pulmonic valve was normal in structure. Pulmonic valve regurgitation is trivial. Aorta: The ascending aorta was not well visualized and the aortic root is normal in size and structure. IAS/Shunts: No atrial level shunt detected by color flow Doppler.  MITRAL VALVE            TRICUSPID VALVE MV Peak grad: 14.1 mmHg TV Peak grad:   3.5 mmHg  MV Mean grad: 3.0 mmHg  TV Mean grad:   1.5 mmHg MV Vmax:      1.88 m/s  TV Vmax:        0.94 m/s MV Vmean:     73.6 cm/s TV Vmean:       64.4 cm/s                         TV VTI:         0.28 msec                         TR Peak grad:   31.4 mmHg                         TR Vmax:        280.00 cm/s Glori Bickers MD Electronically signed by Glori Bickers MD Signature Date/Time: 01/20/2022/1:55:36 PM    Final    DG Chest 2 View  Result Date: 01/17/2022 CLINICAL DATA:  Shortness of breath with lower extremity swelling and chest pain. History of congestive heart failure, asthma, diabetes and hypertension EXAM: CHEST - 2 VIEW COMPARISON:  CT 11/21/2021.  Radiographs 11/21/2021. FINDINGS: 1404 hours. Stable cardiomegaly post median sternotomy, mitral and tricuspid valve repair and left atrial appendage clipping. Stable mild vascular congestion without overt pulmonary edema. No pleural effusion or pneumothorax. The bones appear unchanged. IMPRESSION: Stable postoperative chest.  No acute cardiopulmonary process. Electronically Signed   By: Richardean Sale M.D.   On: 01/17/2022 14:22      EKG  Radiology DG Chest 2 View  Result Date: 02/06/2022 CLINICAL DATA:  Shortness of breath EXAM: CHEST - 2 VIEW COMPARISON:  Chest x-ray 02/01/2022 FINDINGS: Cardiomegaly and mediastinum appear unchanged. Cardiac surgical changes and median sternotomy wires. Slight pulmonary vascular prominence. No focal consolidation identified. No pleural effusion or  pneumothorax. IMPRESSION: Cardiomegaly and minimal pulmonary vascular prominence. Electronically Signed   By: Ofilia Neas M.D.   On: 02/06/2022 18:54    Procedures Procedures    Medications Ordered in ED Medications  furosemide (LASIX) 100 mg in dextrose 5 % 50 mL IVPB (100 mg Intravenous New Bag/Given 02/07/22 0537)  potassium chloride SA (KLOR-CON M) CR tablet 40 mEq (40 mEq Oral Given 02/07/22 0535)    ED Course/ Medical Decision Making/ A&P                           Medical Decision Making Patient with CHF presents with anasarca and SOB and chest tightness   Problems Addressed: Prolonged Q-T interval on ECG:    Details: no QT prologing agents  Amount and/or Complexity of Data Reviewed External Data Reviewed: notes.    Details: previous notes reviewed Labs: ordered.    Details: All labs reviewed:  normal white count and platelets count, anemia with HB of 8.8.  Normal sodium potassium low 3.3 will replace.  Elevated BUN 28 and creatinine 2.44.  BNP 550.8 elevated.  2 normal troponins. Radiology: ordered and independent interpretation performed.    Details: No pulmonary edema by me on CXR ECG/medicine tests: ordered and independent interpretation performed. Decision-making details documented in ED Course.    Details: prolonged QT interval Discussion of management or test interpretation with external provider(s): Case d/w Dr. Clayton Bibles of cardiology.  Given 100 mg of lasix given creatinine and torsemide dose.  Will discuss with Dr. Jeffie Pollock and  then cardiology will see patient in am.    Risk Prescription drug management.    Final Clinical Impression(s) / ED Diagnoses Final diagnoses:  Prolonged Q-T interval on ECG    Signed out to Dr. Regenia Skeeter pending cardiology evaluation   Rx / DC Orders ED Discharge Orders     None         Billiejean Schimek, MD 02/07/22 2111

## 2022-02-09 ENCOUNTER — Ambulatory Visit (INDEPENDENT_AMBULATORY_CARE_PROVIDER_SITE_OTHER): Payer: Self-pay | Admitting: *Deleted

## 2022-02-09 NOTE — Telephone Encounter (Signed)
Summary: pt has question about otc medication   Pt stated she needs to speak with a nurse for advice for an otc medication that she can take for head and chest congestion. Cb# [phone]     Called pt and LM on VM with call back number.

## 2022-02-09 NOTE — Telephone Encounter (Signed)
Summary: pt has question about otc medication   Pt stated she needs to speak with a nurse for advice for an otc medication that she can take for head and chest congestion. Cb# [phone]       Called patient to review medication questions. No answer, LVMTCB [phone].

## 2022-02-09 NOTE — Telephone Encounter (Signed)
Sent to PCP ?

## 2022-02-09 NOTE — Telephone Encounter (Signed)
Summary: pt has question about otc medication   Pt stated she needs to speak with a nurse for advice for an otc medication that she can take for head and chest congestion. Cb# [phone]        Chief Complaint: requesting OTC she can take for head and chest congestion Symptoms: SOB at rest, chest pain at times. C/o head congestion nasal congestion and cough white phlegm x 2-3 weeks. Seen in ED 02/06/22. Blows nose but nothing comes out  Frequency: 2-3 weeks Pertinent Negatives: Patient denies severe chest pain difficulty breathing no fever.  Disposition: '[x]'$ ED /'[]'$ Urgent Care (no appt availability in office) / '[]'$ Appointment(In office/virtual)/ '[]'$  [facility]/ '[]'$ Home Care/ '[x]'$ Refused Recommended Disposition /'[]'$ [facility]/ '[]'$  Follow-up with PCP Additional Notes:   Patient does not want to go back to ED. No available appt until 03/06/22 with PCP. [facility] in [place] today . Recommended patient call pharmacist for  OTC option as well. Please advise  A. Hughley, FC notified of refusal to ED via teams message.    Reason for Disposition  [1] MILD difficulty breathing (e.g., minimal/no SOB at rest, SOB with walking, pulse <100) AND [2] still present when not coughing  Answer Assessment - Initial Assessment Questions 1. ONSET: "When did the cough begin?"      2- 3 weeks ago  2. SEVERITY: "How bad is the cough today?"      na 3. SPUTUM: "Describe the color of your sputum" (none, dry cough; clear, white, yellow, green)     White  4. HEMOPTYSIS: "Are you coughing up any blood?" If so ask: "How much?" (flecks, streaks, tablespoons, etc.)     na 5. DIFFICULTY BREATHING: "Are you having difficulty breathing?" If Yes, ask: "How bad is it?" (e.g., mild, moderate, severe)    - MILD: No SOB at rest, mild SOB with walking, speaks normally in sentences, can lie down, no retractions, pulse < 100.    - MODERATE: SOB at rest, SOB with minimal exertion and prefers to sit,  cannot lie down flat, speaks in phrases, mild retractions, audible wheezing, pulse 100-120.    - SEVERE: Very SOB at rest, speaks in single words, struggling to breathe, sitting hunched forward, retractions, pulse > 120      Shortness of breath at rest and chest pain at times.  6. FEVER: "Do you have a fever?" If Yes, ask: "What is your temperature, how was it measured, and when did it start?"     na 7. CARDIAC HISTORY: "Do you have any history of heart disease?" (e.g., heart attack, congestive heart failure)      See hx  8. LUNG HISTORY: "Do you have any history of lung disease?"  (e.g., pulmonary embolus, asthma, emphysema)     na 9. PE RISK FACTORS: "Do you have a history of blood clots?" (or: recent major surgery, recent prolonged travel, bedridden)     na 10. OTHER SYMPTOMS: "Do you have any other symptoms?" (e.g., runny nose, wheezing, chest pain)       Runny nose blows nose and nothing comes out. Chest pain at times. SOB at rest. Head and chest congestion last seen in ED 02/06/22 11. PREGNANCY: "Is there any chance you are pregnant?" "When was your last menstrual period?"       na 12. TRAVEL: "Have you traveled out of the country in the last month?" (e.g., travel history, exposures)       na  Protocols used: Cough -  Acute Productive-A-AH

## 2022-02-10 ENCOUNTER — Other Ambulatory Visit (INDEPENDENT_AMBULATORY_CARE_PROVIDER_SITE_OTHER): Payer: Self-pay | Admitting: Primary Care

## 2022-02-12 ENCOUNTER — Inpatient Hospital Stay (HOSPITAL_BASED_OUTPATIENT_CLINIC_OR_DEPARTMENT_OTHER)
Admission: EM | Admit: 2022-02-12 | Discharge: 2022-02-24 | DRG: 286 | Disposition: A | Discharge status: Home / Self Care | Payer: 59 | Attending: Internal Medicine | Admitting: Internal Medicine

## 2022-02-12 ENCOUNTER — Other Ambulatory Visit (INDEPENDENT_AMBULATORY_CARE_PROVIDER_SITE_OTHER): Payer: Self-pay | Admitting: Primary Care

## 2022-02-12 ENCOUNTER — Other Ambulatory Visit: Payer: Self-pay

## 2022-02-12 ENCOUNTER — Emergency Department (HOSPITAL_BASED_OUTPATIENT_CLINIC_OR_DEPARTMENT_OTHER): Payer: 59 | Admitting: Radiology

## 2022-02-12 ENCOUNTER — Encounter (HOSPITAL_BASED_OUTPATIENT_CLINIC_OR_DEPARTMENT_OTHER): Payer: Self-pay | Admitting: Emergency Medicine

## 2022-02-12 DIAGNOSIS — I428 Other cardiomyopathies: Secondary | ICD-10-CM

## 2022-02-12 DIAGNOSIS — M1990 Unspecified osteoarthritis, unspecified site: Secondary | ICD-10-CM | POA: Diagnosis present

## 2022-02-12 DIAGNOSIS — E669 Obesity, unspecified: Secondary | ICD-10-CM | POA: Diagnosis present

## 2022-02-12 DIAGNOSIS — Z6841 Body Mass Index (BMI) 40.0 and over, adult: Secondary | ICD-10-CM | POA: Diagnosis not present

## 2022-02-12 DIAGNOSIS — Z794 Long term (current) use of insulin: Secondary | ICD-10-CM

## 2022-02-12 DIAGNOSIS — I471 Supraventricular tachycardia: Secondary | ICD-10-CM | POA: Diagnosis present

## 2022-02-12 DIAGNOSIS — D509 Iron deficiency anemia, unspecified: Secondary | ICD-10-CM | POA: Diagnosis present

## 2022-02-12 DIAGNOSIS — Z20822 Contact with and (suspected) exposure to covid-19: Secondary | ICD-10-CM | POA: Diagnosis present

## 2022-02-12 DIAGNOSIS — E1122 Type 2 diabetes mellitus with diabetic chronic kidney disease: Secondary | ICD-10-CM | POA: Diagnosis present

## 2022-02-12 DIAGNOSIS — I5043 Acute on chronic combined systolic (congestive) and diastolic (congestive) heart failure: Secondary | ICD-10-CM | POA: Diagnosis present

## 2022-02-12 DIAGNOSIS — E1165 Type 2 diabetes mellitus with hyperglycemia: Secondary | ICD-10-CM | POA: Diagnosis not present

## 2022-02-12 DIAGNOSIS — I50813 Acute on chronic right heart failure: Secondary | ICD-10-CM | POA: Diagnosis not present

## 2022-02-12 DIAGNOSIS — Z8249 Family history of ischemic heart disease and other diseases of the circulatory system: Secondary | ICD-10-CM

## 2022-02-12 DIAGNOSIS — Z91030 Bee allergy status: Secondary | ICD-10-CM

## 2022-02-12 DIAGNOSIS — I10 Essential (primary) hypertension: Secondary | ICD-10-CM | POA: Diagnosis not present

## 2022-02-12 DIAGNOSIS — J449 Chronic obstructive pulmonary disease, unspecified: Secondary | ICD-10-CM | POA: Diagnosis present

## 2022-02-12 DIAGNOSIS — F419 Anxiety disorder, unspecified: Secondary | ICD-10-CM | POA: Diagnosis not present

## 2022-02-12 DIAGNOSIS — I071 Rheumatic tricuspid insufficiency: Secondary | ICD-10-CM | POA: Diagnosis present

## 2022-02-12 DIAGNOSIS — I4892 Unspecified atrial flutter: Secondary | ICD-10-CM | POA: Diagnosis present

## 2022-02-12 DIAGNOSIS — I130 Hypertensive heart and chronic kidney disease with heart failure and stage 1 through stage 4 chronic kidney disease, or unspecified chronic kidney disease: Principal | ICD-10-CM | POA: Diagnosis present

## 2022-02-12 DIAGNOSIS — I480 Paroxysmal atrial fibrillation: Secondary | ICD-10-CM | POA: Diagnosis not present

## 2022-02-12 DIAGNOSIS — K219 Gastro-esophageal reflux disease without esophagitis: Secondary | ICD-10-CM | POA: Diagnosis present

## 2022-02-12 DIAGNOSIS — Z882 Allergy status to sulfonamides status: Secondary | ICD-10-CM

## 2022-02-12 DIAGNOSIS — E785 Hyperlipidemia, unspecified: Secondary | ICD-10-CM

## 2022-02-12 DIAGNOSIS — I482 Chronic atrial fibrillation, unspecified: Secondary | ICD-10-CM | POA: Diagnosis present

## 2022-02-12 DIAGNOSIS — G4733 Obstructive sleep apnea (adult) (pediatric): Secondary | ICD-10-CM | POA: Diagnosis present

## 2022-02-12 DIAGNOSIS — I50814 Right heart failure due to left heart failure: Secondary | ICD-10-CM | POA: Diagnosis present

## 2022-02-12 DIAGNOSIS — F32A Depression, unspecified: Secondary | ICD-10-CM

## 2022-02-12 DIAGNOSIS — I2510 Atherosclerotic heart disease of native coronary artery without angina pectoris: Secondary | ICD-10-CM | POA: Diagnosis present

## 2022-02-12 DIAGNOSIS — D631 Anemia in chronic kidney disease: Secondary | ICD-10-CM | POA: Diagnosis present

## 2022-02-12 DIAGNOSIS — I5082 Biventricular heart failure: Secondary | ICD-10-CM | POA: Diagnosis present

## 2022-02-12 DIAGNOSIS — I5023 Acute on chronic systolic (congestive) heart failure: Secondary | ICD-10-CM | POA: Diagnosis not present

## 2022-02-12 DIAGNOSIS — E861 Hypovolemia: Secondary | ICD-10-CM | POA: Diagnosis not present

## 2022-02-12 DIAGNOSIS — Z7901 Long term (current) use of anticoagulants: Secondary | ICD-10-CM

## 2022-02-12 DIAGNOSIS — Z803 Family history of malignant neoplasm of breast: Secondary | ICD-10-CM

## 2022-02-12 DIAGNOSIS — I493 Ventricular premature depolarization: Secondary | ICD-10-CM | POA: Diagnosis not present

## 2022-02-12 DIAGNOSIS — R079 Chest pain, unspecified: Secondary | ICD-10-CM

## 2022-02-12 DIAGNOSIS — E1169 Type 2 diabetes mellitus with other specified complication: Secondary | ICD-10-CM

## 2022-02-12 DIAGNOSIS — R9431 Abnormal electrocardiogram [ECG] [EKG]: Secondary | ICD-10-CM | POA: Diagnosis present

## 2022-02-12 DIAGNOSIS — R0602 Shortness of breath: Secondary | ICD-10-CM | POA: Diagnosis present

## 2022-02-12 DIAGNOSIS — Z9889 Other specified postprocedural states: Secondary | ICD-10-CM

## 2022-02-12 DIAGNOSIS — N184 Chronic kidney disease, stage 4 (severe): Secondary | ICD-10-CM | POA: Diagnosis not present

## 2022-02-12 DIAGNOSIS — Z79899 Other long term (current) drug therapy: Secondary | ICD-10-CM

## 2022-02-12 DIAGNOSIS — Z802 Family history of malignant neoplasm of other respiratory and intrathoracic organs: Secondary | ICD-10-CM

## 2022-02-12 DIAGNOSIS — I272 Pulmonary hypertension, unspecified: Secondary | ICD-10-CM | POA: Diagnosis present

## 2022-02-12 DIAGNOSIS — I509 Heart failure, unspecified: Principal | ICD-10-CM

## 2022-02-12 DIAGNOSIS — Z7985 Long-term (current) use of injectable non-insulin antidiabetic drugs: Secondary | ICD-10-CM

## 2022-02-12 DIAGNOSIS — I5020 Unspecified systolic (congestive) heart failure: Secondary | ICD-10-CM | POA: Diagnosis present

## 2022-02-12 LAB — CBC
HCT: 27.3 % — ABNORMAL LOW (ref 36.0–46.0)
Hemoglobin: 8.1 g/dL — ABNORMAL LOW (ref 12.0–15.0)
MCH: 25.2 pg — ABNORMAL LOW (ref 26.0–34.0)
MCHC: 29.7 g/dL — ABNORMAL LOW (ref 30.0–36.0)
MCV: 84.8 fL (ref 80.0–100.0)
Platelets: 202 10*3/uL (ref 150–400)
RBC: 3.22 MIL/uL — ABNORMAL LOW (ref 3.87–5.11)
RDW: 15.9 % — ABNORMAL HIGH (ref 11.5–15.5)
WBC: 5.3 10*3/uL (ref 4.0–10.5)
nRBC: 0 % (ref 0.0–0.2)

## 2022-02-12 LAB — BASIC METABOLIC PANEL
Anion gap: 11 (ref 5–15)
BUN: 21 mg/dL (ref 8–23)
CO2: 25 mmol/L (ref 22–32)
Calcium: 9.6 mg/dL (ref 8.9–10.3)
Chloride: 105 mmol/L (ref 98–111)
Creatinine, Ser: 2.25 mg/dL — ABNORMAL HIGH (ref 0.44–1.00)
GFR, Estimated: 24 mL/min — ABNORMAL LOW (ref >60)
Glucose, Bld: 102 mg/dL — ABNORMAL HIGH (ref 70–99)
Potassium: 3.9 mmol/L (ref 3.5–5.1)
Sodium: 141 mmol/L (ref 135–145)

## 2022-02-12 LAB — SARS CORONAVIRUS 2 BY RT PCR: SARS Coronavirus 2 by RT PCR: NEGATIVE

## 2022-02-12 LAB — TROPONIN I (HIGH SENSITIVITY)
Troponin I (High Sensitivity): 7 ng/L (ref <18)
Troponin I (High Sensitivity): 6 ng/L (ref <18)

## 2022-02-12 LAB — GLUCOSE, CAPILLARY
Glucose-Capillary: 94 mg/dL (ref 70–99)
Glucose-Capillary: 144 mg/dL — ABNORMAL HIGH (ref 70–99)

## 2022-02-12 LAB — MAGNESIUM: Magnesium: 1.8 mg/dL (ref 1.7–2.4)

## 2022-02-12 LAB — BRAIN NATRIURETIC PEPTIDE: B Natriuretic Peptide: 517.3 pg/mL — ABNORMAL HIGH (ref 0.0–100.0)

## 2022-02-12 MED ORDER — ADULT MULTIVITAMIN W/MINERALS CH
ORAL_TABLET | Freq: Every day | ORAL | Status: DC
  Administered 2022-02-13 – 2022-02-24 (×12): 1 via ORAL
  Filled 2022-02-12 (×13): qty 1

## 2022-02-12 MED ORDER — DULOXETINE HCL 60 MG PO CPEP
60.0000 mg | ORAL_CAPSULE | Freq: Every day | ORAL | Status: DC
  Administered 2022-02-12 – 2022-02-24 (×13): 60 mg via ORAL
  Filled 2022-02-12 (×13): qty 1

## 2022-02-12 MED ORDER — CYCLOSPORINE 0.05 % OP EMUL
1.0000 [drp] | Freq: Two times a day (BID) | OPHTHALMIC | Status: DC
Start: 2022-02-12 — End: 2022-02-24
  Administered 2022-02-12 – 2022-02-24 (×24): 1 [drp] via OPHTHALMIC
  Filled 2022-02-12 (×25): qty 30

## 2022-02-12 MED ORDER — FLUTICASONE PROPIONATE 50 MCG/ACT NA SUSP
2.0000 | Freq: Every day | NASAL | Status: DC
  Administered 2022-02-13 – 2022-02-24 (×11): 2 via NASAL
  Filled 2022-02-12: qty 16

## 2022-02-12 MED ORDER — FUROSEMIDE 10 MG/ML IJ SOLN
40.0000 mg | Freq: Once | INTRAMUSCULAR | Status: AC
Start: 2022-02-12 — End: 2022-02-12
  Administered 2022-02-12: 40 mg via INTRAVENOUS
  Filled 2022-02-12: qty 4

## 2022-02-12 MED ORDER — NOVOLOG 100 UNIT/ML ~~LOC~~ SOLN: 2.0000 [IU] | Freq: Three times a day (TID) | SUBCUTANEOUS | Status: DC

## 2022-02-12 MED ORDER — NITROGLYCERIN 0.4 MG SL SUBL: 0.4000 mg | SUBLINGUAL_TABLET | SUBLINGUAL | Status: DC | PRN

## 2022-02-12 MED ORDER — AMIODARONE HCL 200 MG PO TABS
200.0000 mg | ORAL_TABLET | Freq: Every day | ORAL | Status: DC
  Administered 2022-02-12 – 2022-02-24 (×13): 200 mg via ORAL
  Filled 2022-02-12 (×13): qty 1

## 2022-02-12 MED ORDER — OXYCODONE HCL 5 MG PO TABS
5.0000 mg | ORAL_TABLET | Freq: Four times a day (QID) | ORAL | Status: DC | PRN
  Administered 2022-02-12 – 2022-02-20 (×6): 5 mg via ORAL
  Filled 2022-02-12 (×7): qty 1

## 2022-02-12 MED ORDER — ACETAMINOPHEN 325 MG PO TABS
650.0000 mg | ORAL_TABLET | ORAL | Status: DC | PRN
  Administered 2022-02-20 (×2): 650 mg via ORAL
  Filled 2022-02-12 (×2): qty 2

## 2022-02-12 MED ORDER — SODIUM CHLORIDE 0.9 % IV SOLN: 250.0000 mL | INTRAVENOUS | Status: DC | PRN

## 2022-02-12 MED ORDER — PANTOPRAZOLE SODIUM 40 MG PO TBEC
40.0000 mg | DELAYED_RELEASE_TABLET | Freq: Every day | ORAL | Status: DC
  Administered 2022-02-12 – 2022-02-24 (×13): 40 mg via ORAL
  Filled 2022-02-12 (×13): qty 1

## 2022-02-12 MED ORDER — SODIUM CHLORIDE 0.9% FLUSH
3.0000 mL | Freq: Two times a day (BID) | INTRAVENOUS | Status: DC
  Administered 2022-02-12 – 2022-02-23 (×10): 3 mL via INTRAVENOUS

## 2022-02-12 MED ORDER — FUROSEMIDE 10 MG/ML IJ SOLN
80.0000 mg | Freq: Two times a day (BID) | INTRAMUSCULAR | Status: DC
Start: 2022-02-12 — End: 2022-02-13
  Administered 2022-02-12: 80 mg via INTRAVENOUS
  Filled 2022-02-12 (×2): qty 8

## 2022-02-12 MED ORDER — APIXABAN 5 MG PO TABS
5.0000 mg | ORAL_TABLET | Freq: Two times a day (BID) | ORAL | Status: DC
  Administered 2022-02-12 – 2022-02-21 (×19): 5 mg via ORAL
  Filled 2022-02-12 (×11): qty 1
  Filled 2022-02-12: qty 2
  Filled 2022-02-12 (×9): qty 1

## 2022-02-12 MED ORDER — DOCUSATE SODIUM 100 MG PO CAPS
100.0000 mg | ORAL_CAPSULE | Freq: Two times a day (BID) | ORAL | Status: DC
  Administered 2022-02-12 – 2022-02-24 (×24): 100 mg via ORAL
  Filled 2022-02-12 (×24): qty 1

## 2022-02-12 MED ORDER — ONDANSETRON HCL 4 MG/2ML IJ SOLN: 4.0000 mg | Freq: Four times a day (QID) | INTRAMUSCULAR | Status: DC | PRN

## 2022-02-12 MED ORDER — COLCHICINE 0.3 MG HALF TABLET
0.3000 mg | ORAL_TABLET | Freq: Every day | ORAL | Status: DC
  Administered 2022-02-12 – 2022-02-24 (×13): 0.3 mg via ORAL
  Filled 2022-02-12 (×8): qty 1
  Filled 2022-02-12: qty 0.5
  Filled 2022-02-12 (×5): qty 1

## 2022-02-12 MED ORDER — METOLAZONE 2.5 MG PO TABS
2.5000 mg | ORAL_TABLET | ORAL | Status: DC
  Administered 2022-02-13: 2.5 mg via ORAL
  Filled 2022-02-12: qty 1

## 2022-02-12 MED ORDER — ALBUTEROL SULFATE HFA 108 (90 BASE) MCG/ACT IN AERS
2.0000 | INHALATION_SPRAY | RESPIRATORY_TRACT | Status: DC | PRN
  Administered 2022-02-12: 2 via RESPIRATORY_TRACT
  Filled 2022-02-12: qty 6.7

## 2022-02-12 MED ORDER — METOPROLOL SUCCINATE ER 25 MG PO TB24
25.0000 mg | ORAL_TABLET | Freq: Every day | ORAL | Status: DC
  Administered 2022-02-12 – 2022-02-24 (×13): 25 mg via ORAL
  Filled 2022-02-12 (×13): qty 1

## 2022-02-12 MED ORDER — LORATADINE 10 MG PO TABS
10.0000 mg | ORAL_TABLET | Freq: Every day | ORAL | Status: DC
  Administered 2022-02-13 – 2022-02-24 (×12): 10 mg via ORAL
  Filled 2022-02-12 (×12): qty 1

## 2022-02-12 MED ORDER — ROSUVASTATIN CALCIUM 5 MG PO TABS
10.0000 mg | ORAL_TABLET | Freq: Every evening | ORAL | Status: DC
  Administered 2022-02-12 – 2022-02-23 (×12): 10 mg via ORAL
  Filled 2022-02-12 (×14): qty 2

## 2022-02-12 MED ORDER — BUSPIRONE HCL 5 MG PO TABS
5.0000 mg | ORAL_TABLET | Freq: Two times a day (BID) | ORAL | Status: DC
  Administered 2022-02-12 – 2022-02-24 (×24): 5 mg via ORAL
  Filled 2022-02-12 (×24): qty 1

## 2022-02-12 MED ORDER — POTASSIUM CHLORIDE CRYS ER 20 MEQ PO TBCR
60.0000 meq | EXTENDED_RELEASE_TABLET | Freq: Every day | ORAL | Status: DC
  Administered 2022-02-13 – 2022-02-18 (×6): 60 meq via ORAL
  Filled 2022-02-12 (×7): qty 3

## 2022-02-12 MED ORDER — SODIUM CHLORIDE 0.9% FLUSH
3.0000 mL | INTRAVENOUS | Status: DC | PRN
  Administered 2022-02-12 – 2022-02-23 (×2): 3 mL via INTRAVENOUS

## 2022-02-12 MED ORDER — INSULIN ASPART 100 UNIT/ML IJ SOLN
0.0000 [IU] | Freq: Three times a day (TID) | INTRAMUSCULAR | Status: DC
  Administered 2022-02-13: 2 [IU] via SUBCUTANEOUS
  Administered 2022-02-13 – 2022-02-14 (×3): 1 [IU] via SUBCUTANEOUS
  Administered 2022-02-15: 2 [IU] via SUBCUTANEOUS
  Administered 2022-02-15 – 2022-02-19 (×6): 1 [IU] via SUBCUTANEOUS
  Administered 2022-02-20: 2 [IU] via SUBCUTANEOUS
  Administered 2022-02-20 – 2022-02-21 (×4): 1 [IU] via SUBCUTANEOUS
  Administered 2022-02-23: 2 [IU] via SUBCUTANEOUS
  Administered 2022-02-23: 1 [IU] via SUBCUTANEOUS

## 2022-02-12 MED ORDER — ALBUTEROL SULFATE (2.5 MG/3ML) 0.083% IN NEBU: 2.5000 mg | INHALATION_SOLUTION | Freq: Four times a day (QID) | RESPIRATORY_TRACT | Status: DC | PRN

## 2022-02-12 NOTE — Progress Notes (Signed)
Patient does not have dentures with her at the hospital on admission today.

## 2022-02-12 NOTE — Progress Notes (Signed)
States she  fell on Tuesday at home going into her house. Back pain is an 8 out 10.

## 2022-02-12 NOTE — H&P (Signed)
History and Physical    [name] JXB:[id number] DOB: 1956-05-01 DOA: 02/12/2022  PCP: Grayce Sessions, NP  Patient coming from: [facility].  I have personally briefly reviewed patient's old medical records in [facility]  Chief Complaint: Worsening shortness of breath/lower extremity edema  HPI: [name] is a unfortunate 66 y.o. female with medical history significant of complex cardiac history of morbid obesity, chronic diastolic systolic heart failure with mildly reduced EF 35 to 40% to 50% per 2D echo 11/2021, severe MR/TR, chronic atrial fibrillation on chronic anticoagulation with Eliquis, hypertension, type 2 diabetes who recently hospitalized 5/30 /2023-01/23/2022 with acute on chronic CHF which required Lasix drip and later transitioned to oral torsemide and metolazone, who presents to the ED with a 1 day history of worsening orthopnea, paroxysmal nocturnal dyspnea, shortness of breath, lower extremity edema, 5 pound weight gain over the past 2 days.  Patient also with complaints of midsternal chest pressure.  Patient does also endorse some nausea, bout of emesis, lightheadedness, lower abdominal pain/discomfort.  Patient denies any dysuria.  No melena, no hematemesis, no hematochezia.  Patient denies any syncopal episodes.  Patient patient endorses compliance with medications and low-salt diet.  Patient denies any fevers, no chills,  Patient noted to have presented to the ED on 02/07/2022 with similar symptoms, cardiology consulted at that time and patient given 100 mg IV Lasix with good response and subsequently another 120 mg IV Lasix and noted to have a urine output of greater than 500 cc and cleared for discharge at that time back on home regimen of torsemide 60 mg twice daily as well as increasing metolazone to 2.5 mg 3 times weekly.  ED Course: Patient seen in the ED, chest x-ray done with new pulmonary vascular congestion.  Basic metabolic  profile done with a sodium of 141, potassium of 3.9, BUN of 21, creatinine of 2.25, magnesium of 1.8.  BNP noted elevated at 517.3.  High-sensitivity troponin negative x2.  CBC done with a hemoglobin of 8.1 otherwise was within normal limits.  Patient stated received 40 of Lasix IV in the ED  Review of Systems: As per HPI otherwise all other systems reviewed and are negative.  Past Medical History:  Diagnosis Date   Acute on chronic systolic CHF (congestive heart failure) (HCC) 12/21/2020   Allergic rhinitis    Arthritis    Asthma    Chronic diastolic CHF (congestive heart failure) (HCC)    COPD (chronic obstructive pulmonary disease) (HCC)    Depression    DM (diabetes mellitus) (HCC)    DVT (deep vein thrombosis) in pregnancy    GERD (gastroesophageal reflux disease)    HTN (hypertension)    Hyperlipidemia    Obesity    Pneumonia 04/2018   RIGHT LOBE   Sleep apnea    compliant with CPAP    Past Surgical History:  Procedure Laterality Date   ABDOMINAL HYSTERECTOMY     BUBBLE STUDY  11/26/2020   Procedure: BUBBLE STUDY;  Surgeon: Jodelle Red, MD;  Location: [facility] ENDOSCOPY;  Service: Cardiovascular;;   CARDIOVERSION N/A 05/06/2020   Procedure: CARDIOVERSION;  Surgeon: Jodelle Red, MD;  Location: [facility] ENDOSCOPY;  Service: Cardiovascular;  Laterality: N/A;   CARDIOVERSION N/A 11/04/2020   Procedure: CARDIOVERSION;  Surgeon: Lewayne Bunting, MD;  Location: [facility] ENDOSCOPY;  Service: Cardiovascular;  Laterality: N/A;   CARDIOVERSION N/A 02/01/2021   Procedure: CARDIOVERSION;  Surgeon: Dolores Patty, MD;  Location: [facility] ENDOSCOPY;  Service: Cardiovascular;  Laterality: N/A;   CARDIOVERSION N/A 02/09/2021   Procedure: CARDIOVERSION;  Surgeon: Dolores Patty, MD;  Location: [facility] ENDOSCOPY;  Service: Cardiovascular;  Laterality: N/A;   CARDIOVERSION N/A 04/19/2021   Procedure: CARDIOVERSION;  Surgeon: Pricilla Riffle, MD;  Location: [facility] ENDOSCOPY;  Service: Cardiovascular;   Laterality: N/A;   CARDIOVERSION N/A 11/25/2021   Procedure: CARDIOVERSION;  Surgeon: Dolores Patty, MD;  Location: [facility] ENDOSCOPY;  Service: Cardiovascular;  Laterality: N/A;   CARDIOVERSION N/A 01/31/2022   Procedure: CARDIOVERSION;  Surgeon: Dolores Patty, MD;  Location: [facility] ENDOSCOPY;  Service: Cardiovascular;  Laterality: N/A;   CATARACT EXTRACTION, BILATERAL     CHOLECYSTECTOMY     COLONOSCOPY WITH PROPOFOL N/A 03/05/2015   Procedure: COLONOSCOPY WITH PROPOFOL;  Surgeon: Jeani Hawking, MD;  Location: [facility] ENDOSCOPY;  Service: Endoscopy;  Laterality: N/A;   DIAGNOSTIC LAPAROSCOPY     ingrown hallux Left    KNEE SURGERY     LEFT HEART CATH AND CORONARY ANGIOGRAPHY N/A 12/31/2017   Procedure: LEFT HEART CATH AND CORONARY ANGIOGRAPHY;  Surgeon: Rinaldo Cloud, MD;  Location: [facility] INVASIVE CV LAB;  Service: Cardiovascular;  Laterality: N/A;   MAZE N/A 12/29/2020   Procedure: MAZE;  Surgeon: Loreli Slot, MD;  Location: [facility] OR;  Service: Open Heart Surgery;  Laterality: N/A;   MITRAL VALVE REPAIR N/A 12/29/2020   Procedure: MITRAL VALVE REPAIR USING CARBOMEDICS ANNULOFLEX RING SIZE 30;  Surgeon: Loreli Slot, MD;  Location: [facility] OR;  Service: Open Heart Surgery;  Laterality: N/A;   RIGHT HEART CATH N/A 12/22/2020   Procedure: RIGHT HEART CATH;  Surgeon: Laurey Morale, MD;  Location: [facility] INVASIVE CV LAB;  Service: Cardiovascular;  Laterality: N/A;   RIGHT HEART CATH N/A 01/31/2021   Procedure: RIGHT HEART CATH;  Surgeon: Dolores Patty, MD;  Location: [facility] INVASIVE CV LAB;  Service: Cardiovascular;  Laterality: N/A;   RIGHT HEART CATH N/A 07/29/2021   Procedure: RIGHT HEART CATH;  Surgeon: Dolores Patty, MD;  Location: [facility] INVASIVE CV LAB;  Service: Cardiovascular;  Laterality: N/A;   RIGHT HEART CATH N/A 11/24/2021   Procedure: RIGHT HEART CATH;  Surgeon: Dolores Patty, MD;  Location: [facility] INVASIVE CV LAB;  Service: Cardiovascular;  Laterality: N/A;   TEE WITHOUT  CARDIOVERSION N/A 11/26/2020   Procedure: TRANSESOPHAGEAL ECHOCARDIOGRAM (TEE);  Surgeon: Jodelle Red, MD;  Location: [facility] ENDOSCOPY;  Service: Cardiovascular;  Laterality: N/A;   TEE WITHOUT CARDIOVERSION N/A 12/29/2020   Procedure: TRANSESOPHAGEAL ECHOCARDIOGRAM (TEE);  Surgeon: Loreli Slot, MD;  Location: [facility] OR;  Service: Open Heart Surgery;  Laterality: N/A;   TEE WITHOUT CARDIOVERSION N/A 01/20/2022   Procedure: TRANSESOPHAGEAL ECHOCARDIOGRAM (TEE);  Surgeon: Dolores Patty, MD;  Location: [facility] ENDOSCOPY;  Service: Cardiovascular;  Laterality: N/A;   TRICUSPID VALVE REPLACEMENT N/A 12/29/2020   Procedure: 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 RING SIZE 34;  Surgeon: Loreli Slot, MD;  Location: [facility] OR;  Service: Open Heart Surgery;  Laterality: N/A;    Social History  reports that she has never smoked. She has never used smokeless tobacco. She reports that she does not drink alcohol and does not use drugs.  Allergies  Allergen Reactions   Bee Pollen Anaphylaxis   Sulfa Antibiotics Itching    Family History  Problem Relation Age of Onset   Breast cancer Mother    Cancer Mother    Hypertension Mother    Cancer Father    Hypertension Sister  Hypertension Brother    CAD Other    Mother deceased age 91 from breast cancer in uterine cancer.  Father deceased age 36 from cancer of the vocal cords per patient.  Prior to Admission medications   Medication Sig Start Date End Date Taking? Authorizing Provider  amiodarone (PACERONE) 200 MG tablet Take 1 tablet (200 mg total) by mouth daily. 09/27/21  Yes Lanier Prude, MD  apixaban (ELIQUIS) 5 MG TABS tablet Take 1 tablet (5 mg total) by mouth 2 (two) times daily. 05/16/21  Yes Milford, Anderson Malta, FNP  busPIRone (BUSPAR) 5 MG tablet Take 1 tablet (5 mg total) by mouth 2 (two) times daily. 09/14/21  Yes Grayce Sessions, NP  colchicine 0.6 MG tablet Take 0.5 tablets (0.3 mg total) by mouth daily.  12/08/21  Yes Hoy Register, MD  Dulaglutide (TRULICITY) 1.5 MG/0.5ML SOPN Inject 1.5 mg into the skin every Wednesday. 01/25/22  Yes Grayce Sessions, NP  DULoxetine (CYMBALTA) 60 MG capsule Take 1 capsule (60 mg total) by mouth daily. 09/14/21  Yes Grayce Sessions, NP  insulin aspart (NOVOLOG) 100 UNIT/ML injection Inject 2-10 Units into the skin 3 (three) times daily before meals. Per sliding scale 09/02/21  Yes Glade Lloyd, MD  loratadine (CLARITIN) 10 MG tablet Take 1 tablet (10 mg total) by mouth daily. 12/29/21  Yes Grayce Sessions, NP  metolazone (ZAROXOLYN) 2.5 MG tablet Take 1 tablet (2.5 mg total) by mouth 2 (two) times a week. Monday Friday. 02/06/22  Yes Andrey Farmer, PA-C  metoprolol succinate (TOPROL-XL) 25 MG 24 hr tablet Take 1 tablet (25 mg total) by mouth in the morning and at bedtime. 01/02/22  Yes Bensimhon, Bevelyn Buckles, MD  pantoprazole (PROTONIX) 40 MG tablet TAKE ONE TABLET BY MOUTH DAILY AT 9AM 01/20/22  Yes Grayce Sessions, NP  potassium chloride SA (KLOR-CON M) 20 MEQ tablet Take 3 tablets (60 mEq total) by mouth daily. Take an additional 20 meq with every dose of METOLAZONE 01/27/22  Yes Andrey Farmer, PA-C  torsemide (DEMADEX) 20 MG tablet Take 3 tablets (60 mg total) by mouth 2 (two) times daily. 01/23/22  Yes Tyrone Nine, MD  acetaminophen (TYLENOL) 325 MG tablet Take 2 tablets (650 mg total) by mouth every 6 (six) hours as needed for mild pain or moderate pain. 12/07/21   Arrien, York Ram, MD  albuterol (VENTOLIN HFA) 108 (90 Base) MCG/ACT inhaler Inhale 1-2 puffs into the lungs every 6 (six) hours as needed for wheezing or shortness of breath. 12/29/21   Grayce Sessions, NP  Alcohol Swabs (B-D SINGLE USE SWABS REGULAR) PADS 1 each by Other route daily. 07/06/21   Grayce Sessions, NP  Alcohol Swabs PADS 1 application by Does not apply route in the morning, at noon, and at bedtime. 09/14/21   Grayce Sessions, NP  blood glucose meter  kit and supplies KIT Dispense based on patient and insurance preference. Use up to four times daily as directed. 02/10/21   Tyrone Nine, MD  blood glucose meter kit and supplies Dispense based on patient and insurance preference. Use up to four times daily as directed. (FOR ICD-10 E10.9, E11.9). 07/06/21   Grayce Sessions, NP  Blood Glucose Monitoring Suppl (ACCU-CHEK GUIDE ME) w/Device KIT Use as instructed to check blood sugar three times daily. E11.69 04/21/20   Hoy Register, MD  diphenhydrAMINE HCl (BENADRYL PO) Take 1 tablet by mouth daily as needed (itchyness).    [provider]  diphenhydrAMINE-Zinc Acetate (BENADRYL ITCH RELIEF EX) Apply 1 application. topically daily as needed (itchyness).    [provider]  docusate sodium (COLACE) 100 MG capsule Take 100 mg by mouth 2 (two) times daily.    [provider]  EPINEPHrine 0.3 mg/0.3 mL IJ SOAJ injection Inject 0.3 mg into the muscle as needed for anaphylaxis. 06/25/20   Grayce Sessions, NP  fluticasone (FLONASE) 50 MCG/ACT nasal spray Place 2 sprays into both nostrils daily. 12/29/21   Grayce Sessions, NP  glucose blood (ACCU-CHEK GUIDE) test strip Use to check blood sugar TID. E11.69 07/13/21   Hoy Register, MD  lidocaine (XYLOCAINE) 5 % ointment Apply 1 application. topically as needed for moderate pain. First choice is lidocaine patch, but if not covered/expensive, please fill ointment Patient taking differently: Apply 1 application  topically 2 (two) times daily as needed for moderate pain. 11/08/21   Jodelle Red, MD  Multiple Vitamins-Minerals (MULTIVITAMIN WOMEN PO) Take 1 tablet by mouth daily.    [provider]  nitroGLYCERIN (NITROSTAT) 0.4 MG SL tablet Place 1 tablet (0.4 mg total) under the tongue every 5 (five) minutes x 3 doses as needed for chest pain. 01/08/14   Rinaldo Cloud, MD  oxyCODONE (ROXICODONE) 5 MG immediate release tablet Take 1 tablet (5 mg total) by mouth  every 6 (six) hours as needed for up to 15 doses for severe pain. 02/01/22   Terald Sleeper, MD  RESTASIS 0.05 % ophthalmic emulsion Place 1 drop into both eyes 2 (two) times daily. 11/19/17   [provider]  rosuvastatin (CRESTOR) 10 MG tablet Take 1 tablet (10 mg total) by mouth every evening. 09/02/21   Glade Lloyd, MD    Physical Exam: Vitals:   02/12/22 1500 02/12/22 1521 02/12/22 1600 02/12/22 1716  BP: (!) 142/115 (!) 141/88 115/68 (!) 151/85  Pulse:  97 (!) 103 (!) 105  Resp:  (!) 21 18 (!) 21  Temp:    98.1 F (36.7 C)  TempSrc:    Oral  SpO2:  99% 98% 100%  Weight:    (!) 139.7 kg  Height:    5\' 7"  (1.702 m)    Constitutional: NAD, calm, comfortable Vitals:   02/12/22 1500 02/12/22 1521 02/12/22 1600 02/12/22 1716  BP: (!) 142/115 (!) 141/88 115/68 (!) 151/85  Pulse:  97 (!) 103 (!) 105  Resp:  (!) 21 18 (!) 21  Temp:    98.1 F (36.7 C)  TempSrc:    Oral  SpO2:  99% 98% 100%  Weight:    (!) 139.7 kg  Height:    5\' 7"  (1.702 m)   Eyes: PERRL, lids and conjunctivae normal ENMT: Mucous membranes are moist. Posterior pharynx clear of any exudate or lesions.Normal dentition.  Neck: normal, supple, no masses, no thyromegaly Respiratory: Crackles in the bibasilar region, no wheezing, fair air movement.  No use of accessory muscles of respiration.  Speaking in full sentences.   Cardiovascular: Irregularly irregular.  No murmurs rubs or gallops.  Trace to 1+ bilateral lower extremity edema.  Positive JVD. Abdomen: no tenderness, no masses palpated. No hepatosplenomegaly. Bowel sounds positive.  Musculoskeletal: no clubbing / cyanosis. No joint deformity upper and lower extremities. Good ROM, no contractures. Normal muscle tone.  Skin: no rashes, lesions, ulcers. No induration Neurologic: CN 2-12 grossly intact. Sensation intact, DTR normal. Strength 5/5 in all 4.  Psychiatric: Normal judgment and insight. Alert and oriented x 3. Normal mood.   Labs on  Admission: I have personally reviewed following labs and imaging studies  CBC: Recent Labs  Lab 02/06/22 1809 02/12/22 1226  WBC 5.7 5.3  HGB 8.8* 8.1*  HCT 28.4* 27.3*  MCV 85.8 84.8  PLT 206 202     Basic Metabolic Panel: Recent Labs  Lab 02/06/22 1809 02/07/22 0419 02/12/22 1226  NA 141 142 141  K 3.3* 3.5 3.9  CL 106 107 105  CO2 25 27 25   GLUCOSE 100* 160* 102*  BUN 28* 25* 21  CREATININE 2.44* 2.35* 2.25*  CALCIUM 9.2 9.4 9.6  MG  --   --  1.8     GFR: Estimated Creatinine Clearance: 36.5 mL/min (A) (by C-G formula based on SCr of 2.25 mg/dL (H)).  Liver Function Tests: Recent Labs  Lab 02/06/22 1809  AST 27  ALT 21  ALKPHOS 145*  BILITOT 1.5*  PROT 8.0  ALBUMIN 3.3*     Urine analysis:    Component Value Date/Time   COLORURINE YELLOW 11/21/2021 1926   APPEARANCEUR CLEAR 11/21/2021 1926   LABSPEC 1.008 11/21/2021 1926   PHURINE 5.0 11/21/2021 1926   GLUCOSEU NEGATIVE 11/21/2021 1926   HGBUR SMALL (A) 11/21/2021 1926   BILIRUBINUR NEGATIVE 11/21/2021 1926   BILIRUBINUR negative 09/24/2019 1742   KETONESUR NEGATIVE 11/21/2021 1926   PROTEINUR 30 (A) 11/21/2021 1926   UROBILINOGEN 1.0 09/24/2019 1742   UROBILINOGEN 1.0 10/26/2014 0249   NITRITE NEGATIVE 11/21/2021 1926   LEUKOCYTESUR NEGATIVE 11/21/2021 1926    Radiological Exams on Admission: DG Chest Port 1 View  Result Date: 02/12/2022 CLINICAL DATA:  CHF and 2 leaky valves. Shortness of breath at rest since last evening. EXAM: PORTABLE CHEST 1 VIEW COMPARISON:  02/06/2022 FINDINGS: Previous median sternotomy and left atrial clipping. Unchanged cardiac enlargement. New pulmonary vascular congestion without frank edema. No convincing evidence for pleural effusion. No airspace consolidation. IMPRESSION: New pulmonary vascular congestion. Electronically Signed   By: Signa Kell M.D.   On: 02/12/2022 12:31    EKG: Independently reviewed.  With some PVCs, prolonged QT interval,?   A-fib,  Assessment/Plan Principal Problem:   Acute on chronic combined systolic and diastolic CHF (congestive heart failure) (HCC) Active Problems:   Diabetes mellitus type 2 in obese (HCC)   Acute on chronic systolic CHF (congestive heart failure) (HCC)   Biventricular heart failure with reduced left ventricular function (HCC)   AF (paroxysmal atrial fibrillation) (HCC)   Essential hypertension   Type 2 diabetes mellitus with hyperlipidemia (HCC)   Depression   Class 3 obesity (HCC)   NICM (nonischemic cardiomyopathy) (HCC)   S/P mitral valve repair   Anxiety and depression   GERD (gastroesophageal reflux disease)   CHF (congestive heart failure) (HCC)   HFrEF (heart failure with reduced ejection fraction) (HCC)    #1 acute on chronic combined systolic diastolic heart failure- -Patient presenting with 1 day history of worsening orthopnea, paroxysmal nocturnal dyspnea, chest pressure, lower extremity edema, 5 pound weight gain per patient in 2 days.  Patient noted on prior discharge to a weight of 135.4 kg and on presentation today weight up to 139.7 kg.  BNP elevated. -Chest x-ray concerning for volume overload. -Patient with multiple hospitalizations, recently consulted on by cardiology on 02/07/2022. -Patient with complex cardiac history and received a dose of 40 mg IV x1 in the ED prior to transfer to [facility]. -Cardiac enzymes negative x2. -Patient with recent 2D echo 11/22/2021 with EF of 35 to 40%, left ventricular global hypokinesis, right ventricular  systolic function moderately reduced, moderately enlarged right ventricular size, moderately elevated PA systolic pressure, severely dilated left atrial size, severely dilated right atrial size, trivial MVR. -TEE ED done 01/20/2022 with severe biventricular dysfunction, trivial MR, severe TR. -We will place on Lasix 80 mg IV every 12 hours, continue home regimen Toprol-XL 25 mg twice daily, continue Zaroxolyn 2.5 mg every  Monday and Thursday, statin, strict I's and O's, daily weights. -Consult cardiology for further evaluation and management. -May need input from heart failure team.  2.  Paroxysmal atrial fibrillation/atrial flutter -Patient currently in atrial fibrillation. -Patient noted to have failed Tikosyn in the past. -Continue home regimen amiodarone, Eliquis for anticoagulation. - 3.  CKD stage IV -Patient noted to have a new baseline of approximately 2.6-2.8. -Creatinine currently at 2.25 today. -Monitor renal function with diuresis. -Outpatient follow-up with nephrology.  4.  CAD -Patient with complaints of chest pressure on presentation however high-sensitivity troponins are negative x2. -Chest pressure likely secondary to volume overload. -Patient noted to have a cardiac cath in 2019 with mild nonobstructive CAD. -Continue Lopressor, statin, Eliquis.  5.  Diabetes mellitus type 2 -Hemoglobin A1c 6.7 (11/21/2021) -Patient noted to be on Trulicity prior to admission. -Placed on sliding scale insulin.  6.  OSA -Patient currently awaiting split-night sleep study to titrate CPAP. -CPAP nightly.  7.  Class III obesity (BMI 48.24 kg/m2 -Lifestyle modification. -Outpatient follow-up with PCP.  8.  GERD -PPI.  9.  Hyperlipidemia -Statin.  10.  Depression/anxiety -Resume home regimen Cymbalta, BuSpar.   DVT prophylaxis: Eliquis Code Status:   Full Family Communication:  Updated patient.  No family at bedside. Disposition Plan:   Patient is from:  Home  Anticipated DC to:  TBD  Anticipated DC date:  TBD  Anticipated DC barriers: Clinical improvement  Consults called:  Cardiology [place]  Admission status:  Admit to inpatient.  Severity of Illness: The appropriate patient status for this patient is INPATIENT. Inpatient status is judged to be reasonable and necessary in order to provide the required intensity of service to ensure the patient's safety. The patient's presenting  symptoms, physical exam findings, and initial radiographic and laboratory data in the context of their chronic comorbidities is felt to place them at high risk for further clinical deterioration. Furthermore, it is not anticipated that the patient will be medically stable for discharge from the hospital within 2 midnights of admission.   * I certify that at the point of admission it is my clinical judgment that the patient will require inpatient hospital care spanning beyond 2 midnights from the point of admission due to high intensity of service, high risk for further deterioration and high frequency of surveillance required.*    Ramiro Harvest MD [facility]  How to contact the [facility] Attending or Consulting provider 7A - 7P or covering provider during after hours 7P -7A, for this patient?   Check the care team in [facility] and look for a) attending/consulting [facility] provider listed and b) the [facility] team listed Log into [email or web address] and use [place]'s universal password to access. If you do not have the password, please contact the hospital operator. Locate the [facility] provider you are looking for under [facility] and page to a number that you can be directly reached. If you still have difficulty reaching the provider, please page the [facility] (Director on Call) for the Hospitalists listed on amion for assistance.  02/12/2022, 7:40 PM

## 2022-02-12 NOTE — Progress Notes (Deleted)
History and Physical    [name]:[id number] DOB: 10-09-1955 DOA: 02/12/2022  PCP: Grayce Sessions, NP  Patient coming from: [facility].  I have personally briefly reviewed patient's old medical records in [facility]  Chief Complaint: Worsening shortness of breath/lower extremity edema  HPI: [name] is a unfortunate 66 y.o. female with medical history significant of complex cardiac history of morbid obesity, chronic diastolic systolic heart failure with mildly reduced EF 35 to 40% to 50% per 2D echo 11/2021, severe MR/TR, chronic atrial fibrillation on chronic anticoagulation with Eliquis, hypertension, type 2 diabetes who recently hospitalized 5/30 /2023-01/23/2022 with acute on chronic CHF which required Lasix drip and later transitioned to oral torsemide and metolazone, who presents to the ED with a 1 day history of worsening orthopnea, paroxysmal nocturnal dyspnea, shortness of breath, lower extremity edema, 5 pound weight gain over the past 2 days.  Patient also with complaints of midsternal chest pressure.  Patient does also endorse some nausea, bout of emesis, lightheadedness, lower abdominal pain/discomfort.  Patient denies any dysuria.  No melena, no hematemesis, no hematochezia.  Patient denies any syncopal episodes.  Patient patient endorses compliance with medications and low-salt diet.  Patient denies any fevers, no chills,  Patient noted to have presented to the ED on 02/07/2022 with similar symptoms, cardiology consulted at that time and patient given 100 mg IV Lasix with good response and subsequently another 120 mg IV Lasix and noted to have a urine output of greater than 500 cc and cleared for discharge at that time back on home regimen of torsemide 60 mg twice daily as well as increasing metolazone to 2.5 mg 3 times weekly.  ED Course: Patient seen in the ED, chest x-ray done with new pulmonary vascular congestion.  Basic metabolic  profile done with a sodium of 141, potassium of 3.9, BUN of 21, creatinine of 2.25, magnesium of 1.8.  BNP noted elevated at 517.3.  High-sensitivity troponin negative x2.  CBC done with a hemoglobin of 8.1 otherwise was within normal limits.  Patient stated received 40 of Lasix IV in the ED  Review of Systems: As per HPI otherwise all other systems reviewed and are negative.  Past Medical History:  Diagnosis Date   Acute on chronic systolic CHF (congestive heart failure) (HCC) 12/21/2020   Allergic rhinitis    Arthritis    Asthma    Chronic diastolic CHF (congestive heart failure) (HCC)    COPD (chronic obstructive pulmonary disease) (HCC)    Depression    DM (diabetes mellitus) (HCC)    DVT (deep vein thrombosis) in pregnancy    GERD (gastroesophageal reflux disease)    HTN (hypertension)    Hyperlipidemia    Obesity    Pneumonia 04/2018   RIGHT LOBE   Sleep apnea    compliant with CPAP    Past Surgical History:  Procedure Laterality Date   ABDOMINAL HYSTERECTOMY     BUBBLE STUDY  11/26/2020   Procedure: BUBBLE STUDY;  Surgeon: Jodelle Red, MD;  Location: [facility] ENDOSCOPY;  Service: Cardiovascular;;   CARDIOVERSION N/A 05/06/2020   Procedure: CARDIOVERSION;  Surgeon: Jodelle Red, MD;  Location: [facility] ENDOSCOPY;  Service: Cardiovascular;  Laterality: N/A;   CARDIOVERSION N/A 11/04/2020   Procedure: CARDIOVERSION;  Surgeon: Lewayne Bunting, MD;  Location: [facility] ENDOSCOPY;  Service: Cardiovascular;  Laterality: N/A;   CARDIOVERSION N/A 02/01/2021   Procedure: CARDIOVERSION;  Surgeon: Dolores Patty, MD;  Location: [facility] ENDOSCOPY;  Service: Cardiovascular;  Laterality: N/A;   CARDIOVERSION N/A 02/09/2021   Procedure: CARDIOVERSION;  Surgeon: Dolores Patty, MD;  Location: [facility] ENDOSCOPY;  Service: Cardiovascular;  Laterality: N/A;   CARDIOVERSION N/A 04/19/2021   Procedure: CARDIOVERSION;  Surgeon: Pricilla Riffle, MD;  Location: [facility] ENDOSCOPY;  Service: Cardiovascular;   Laterality: N/A;   CARDIOVERSION N/A 11/25/2021   Procedure: CARDIOVERSION;  Surgeon: Dolores Patty, MD;  Location: [facility] ENDOSCOPY;  Service: Cardiovascular;  Laterality: N/A;   CARDIOVERSION N/A 01/31/2022   Procedure: CARDIOVERSION;  Surgeon: Dolores Patty, MD;  Location: [facility] ENDOSCOPY;  Service: Cardiovascular;  Laterality: N/A;   CATARACT EXTRACTION, BILATERAL     CHOLECYSTECTOMY     COLONOSCOPY WITH PROPOFOL N/A 03/05/2015   Procedure: COLONOSCOPY WITH PROPOFOL;  Surgeon: Jeani Hawking, MD;  Location: [facility] ENDOSCOPY;  Service: Endoscopy;  Laterality: N/A;   DIAGNOSTIC LAPAROSCOPY     ingrown hallux Left    KNEE SURGERY     LEFT HEART CATH AND CORONARY ANGIOGRAPHY N/A 12/31/2017   Procedure: LEFT HEART CATH AND CORONARY ANGIOGRAPHY;  Surgeon: Rinaldo Cloud, MD;  Location: [facility] INVASIVE CV LAB;  Service: Cardiovascular;  Laterality: N/A;   MAZE N/A 12/29/2020   Procedure: MAZE;  Surgeon: Loreli Slot, MD;  Location: [facility] OR;  Service: Open Heart Surgery;  Laterality: N/A;   MITRAL VALVE REPAIR N/A 12/29/2020   Procedure: MITRAL VALVE REPAIR USING CARBOMEDICS ANNULOFLEX RING SIZE 30;  Surgeon: Loreli Slot, MD;  Location: [facility] OR;  Service: Open Heart Surgery;  Laterality: N/A;   RIGHT HEART CATH N/A 12/22/2020   Procedure: RIGHT HEART CATH;  Surgeon: Laurey Morale, MD;  Location: [facility] INVASIVE CV LAB;  Service: Cardiovascular;  Laterality: N/A;   RIGHT HEART CATH N/A 01/31/2021   Procedure: RIGHT HEART CATH;  Surgeon: Dolores Patty, MD;  Location: [facility] INVASIVE CV LAB;  Service: Cardiovascular;  Laterality: N/A;   RIGHT HEART CATH N/A 07/29/2021   Procedure: RIGHT HEART CATH;  Surgeon: Dolores Patty, MD;  Location: [facility] INVASIVE CV LAB;  Service: Cardiovascular;  Laterality: N/A;   RIGHT HEART CATH N/A 11/24/2021   Procedure: RIGHT HEART CATH;  Surgeon: Dolores Patty, MD;  Location: [facility] INVASIVE CV LAB;  Service: Cardiovascular;  Laterality: N/A;   TEE WITHOUT  CARDIOVERSION N/A 11/26/2020   Procedure: TRANSESOPHAGEAL ECHOCARDIOGRAM (TEE);  Surgeon: Jodelle Red, MD;  Location: [facility] ENDOSCOPY;  Service: Cardiovascular;  Laterality: N/A;   TEE WITHOUT CARDIOVERSION N/A 12/29/2020   Procedure: TRANSESOPHAGEAL ECHOCARDIOGRAM (TEE);  Surgeon: Loreli Slot, MD;  Location: [facility] OR;  Service: Open Heart Surgery;  Laterality: N/A;   TEE WITHOUT CARDIOVERSION N/A 01/20/2022   Procedure: TRANSESOPHAGEAL ECHOCARDIOGRAM (TEE);  Surgeon: Dolores Patty, MD;  Location: [facility] ENDOSCOPY;  Service: Cardiovascular;  Laterality: N/A;   TRICUSPID VALVE REPLACEMENT N/A 12/29/2020   Procedure: 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 RING SIZE 34;  Surgeon: Loreli Slot, MD;  Location: [facility] OR;  Service: Open Heart Surgery;  Laterality: N/A;    Social History  reports that she has never smoked. She has never used smokeless tobacco. She reports that she does not drink alcohol and does not use drugs.  Allergies  Allergen Reactions   Bee Pollen Anaphylaxis   Sulfa Antibiotics Itching    Family History  Problem Relation Age of Onset   Breast cancer Mother    Cancer Mother    Hypertension Mother    Cancer Father    Hypertension Sister  Hypertension Brother    CAD Other    Mother deceased age 82 from breast cancer in uterine cancer.  Father deceased age 59 from cancer of the vocal cords per patient.  Prior to Admission medications   Medication Sig Start Date End Date Taking? Authorizing Provider  amiodarone (PACERONE) 200 MG tablet Take 1 tablet (200 mg total) by mouth daily. 09/27/21  Yes Lanier Prude, MD  apixaban (ELIQUIS) 5 MG TABS tablet Take 1 tablet (5 mg total) by mouth 2 (two) times daily. 05/16/21  Yes Milford, Anderson Malta, FNP  busPIRone (BUSPAR) 5 MG tablet Take 1 tablet (5 mg total) by mouth 2 (two) times daily. 09/14/21  Yes Grayce Sessions, NP  colchicine 0.6 MG tablet Take 0.5 tablets (0.3 mg total) by mouth daily.  12/08/21  Yes Hoy Register, MD  Dulaglutide (TRULICITY) 1.5 MG/0.5ML SOPN Inject 1.5 mg into the skin every Wednesday. 01/25/22  Yes Grayce Sessions, NP  DULoxetine (CYMBALTA) 60 MG capsule Take 1 capsule (60 mg total) by mouth daily. 09/14/21  Yes Grayce Sessions, NP  insulin aspart (NOVOLOG) 100 UNIT/ML injection Inject 2-10 Units into the skin 3 (three) times daily before meals. Per sliding scale 09/02/21  Yes Glade Lloyd, MD  loratadine (CLARITIN) 10 MG tablet Take 1 tablet (10 mg total) by mouth daily. 12/29/21  Yes Grayce Sessions, NP  metolazone (ZAROXOLYN) 2.5 MG tablet Take 1 tablet (2.5 mg total) by mouth 2 (two) times a week. Monday Friday. 02/06/22  Yes Andrey Farmer, PA-C  metoprolol succinate (TOPROL-XL) 25 MG 24 hr tablet Take 1 tablet (25 mg total) by mouth in the morning and at bedtime. 01/02/22  Yes Bensimhon, Bevelyn Buckles, MD  pantoprazole (PROTONIX) 40 MG tablet TAKE ONE TABLET BY MOUTH DAILY AT 9AM 01/20/22  Yes Grayce Sessions, NP  potassium chloride SA (KLOR-CON M) 20 MEQ tablet Take 3 tablets (60 mEq total) by mouth daily. Take an additional 20 meq with every dose of METOLAZONE 01/27/22  Yes Andrey Farmer, PA-C  torsemide (DEMADEX) 20 MG tablet Take 3 tablets (60 mg total) by mouth 2 (two) times daily. 01/23/22  Yes Tyrone Nine, MD  acetaminophen (TYLENOL) 325 MG tablet Take 2 tablets (650 mg total) by mouth every 6 (six) hours as needed for mild pain or moderate pain. 12/07/21   Arrien, York Ram, MD  albuterol (VENTOLIN HFA) 108 (90 Base) MCG/ACT inhaler Inhale 1-2 puffs into the lungs every 6 (six) hours as needed for wheezing or shortness of breath. 12/29/21   Grayce Sessions, NP  Alcohol Swabs (B-D SINGLE USE SWABS REGULAR) PADS 1 each by Other route daily. 07/06/21   Grayce Sessions, NP  Alcohol Swabs PADS 1 application by Does not apply route in the morning, at noon, and at bedtime. 09/14/21   Grayce Sessions, NP  blood glucose meter  kit and supplies KIT Dispense based on patient and insurance preference. Use up to four times daily as directed. 02/10/21   Tyrone Nine, MD  blood glucose meter kit and supplies Dispense based on patient and insurance preference. Use up to four times daily as directed. (FOR ICD-10 E10.9, E11.9). 07/06/21   Grayce Sessions, NP  Blood Glucose Monitoring Suppl (ACCU-CHEK GUIDE ME) w/Device KIT Use as instructed to check blood sugar three times daily. E11.69 04/21/20   Hoy Register, MD  diphenhydrAMINE HCl (BENADRYL PO) Take 1 tablet by mouth daily as needed (itchyness).    [provider]  diphenhydrAMINE-Zinc Acetate (BENADRYL ITCH RELIEF EX) Apply 1 application. topically daily as needed (itchyness).    [provider]  docusate sodium (COLACE) 100 MG capsule Take 100 mg by mouth 2 (two) times daily.    [provider]  EPINEPHrine 0.3 mg/0.3 mL IJ SOAJ injection Inject 0.3 mg into the muscle as needed for anaphylaxis. 06/25/20   Grayce Sessions, NP  fluticasone (FLONASE) 50 MCG/ACT nasal spray Place 2 sprays into both nostrils daily. 12/29/21   Grayce Sessions, NP  glucose blood (ACCU-CHEK GUIDE) test strip Use to check blood sugar TID. E11.69 07/13/21   Hoy Register, MD  lidocaine (XYLOCAINE) 5 % ointment Apply 1 application. topically as needed for moderate pain. First choice is lidocaine patch, but if not covered/expensive, please fill ointment Patient taking differently: Apply 1 application  topically 2 (two) times daily as needed for moderate pain. 11/08/21   Jodelle Red, MD  Multiple Vitamins-Minerals (MULTIVITAMIN WOMEN PO) Take 1 tablet by mouth daily.    [provider]  nitroGLYCERIN (NITROSTAT) 0.4 MG SL tablet Place 1 tablet (0.4 mg total) under the tongue every 5 (five) minutes x 3 doses as needed for chest pain. 01/08/14   Rinaldo Cloud, MD  oxyCODONE (ROXICODONE) 5 MG immediate release tablet Take 1 tablet (5 mg total) by mouth  every 6 (six) hours as needed for up to 15 doses for severe pain. 02/01/22   Terald Sleeper, MD  RESTASIS 0.05 % ophthalmic emulsion Place 1 drop into both eyes 2 (two) times daily. 11/19/17   [provider]  rosuvastatin (CRESTOR) 10 MG tablet Take 1 tablet (10 mg total) by mouth every evening. 09/02/21   Glade Lloyd, MD    Physical Exam: Vitals:   02/12/22 1500 02/12/22 1521 02/12/22 1600 02/12/22 1716  BP: (!) 142/115 (!) 141/88 115/68 (!) 151/85  Pulse:  97 (!) 103 (!) 105  Resp:  (!) 21 18 (!) 21  Temp:    98.1 F (36.7 C)  TempSrc:    Oral  SpO2:  99% 98% 100%  Weight:    (!) 139.7 kg  Height:    5\' 7"  (1.702 m)    Constitutional: NAD, calm, comfortable Vitals:   02/12/22 1500 02/12/22 1521 02/12/22 1600 02/12/22 1716  BP: (!) 142/115 (!) 141/88 115/68 (!) 151/85  Pulse:  97 (!) 103 (!) 105  Resp:  (!) 21 18 (!) 21  Temp:    98.1 F (36.7 C)  TempSrc:    Oral  SpO2:  99% 98% 100%  Weight:    (!) 139.7 kg  Height:    5\' 7"  (1.702 m)   Eyes: PERRL, lids and conjunctivae normal ENMT: Mucous membranes are moist. Posterior pharynx clear of any exudate or lesions.Normal dentition.  Neck: normal, supple, no masses, no thyromegaly Respiratory: Crackles in the bibasilar region, no wheezing, fair air movement.  No use of accessory muscles of respiration.  Speaking in full sentences.   Cardiovascular: Irregularly irregular.  No murmurs rubs or gallops.  Trace to 1+ bilateral lower extremity edema.  Positive JVD. Abdomen: no tenderness, no masses palpated. No hepatosplenomegaly. Bowel sounds positive.  Musculoskeletal: no clubbing / cyanosis. No joint deformity upper and lower extremities. Good ROM, no contractures. Normal muscle tone.  Skin: no rashes, lesions, ulcers. No induration Neurologic: CN 2-12 grossly intact. Sensation intact, DTR normal. Strength 5/5 in all 4.  Psychiatric: Normal judgment and insight. Alert and oriented x 3. Normal mood.   Labs on  Admission: I have personally reviewed following labs and imaging studies  CBC: Recent Labs  Lab 02/06/22 1809 02/12/22 1226  WBC 5.7 5.3  HGB 8.8* 8.1*  HCT 28.4* 27.3*  MCV 85.8 84.8  PLT 206 202    Basic Metabolic Panel: Recent Labs  Lab 02/06/22 1809 02/07/22 0419 02/12/22 1226  NA 141 142 141  K 3.3* 3.5 3.9  CL 106 107 105  CO2 25 27 25   GLUCOSE 100* 160* 102*  BUN 28* 25* 21  CREATININE 2.44* 2.35* 2.25*  CALCIUM 9.2 9.4 9.6  MG  --   --  1.8    GFR: Estimated Creatinine Clearance: 36.5 mL/min (A) (by C-G formula based on SCr of 2.25 mg/dL (H)).  Liver Function Tests: Recent Labs  Lab 02/06/22 1809  AST 27  ALT 21  ALKPHOS 145*  BILITOT 1.5*  PROT 8.0  ALBUMIN 3.3*    Urine analysis:    Component Value Date/Time   COLORURINE YELLOW 11/21/2021 1926   APPEARANCEUR CLEAR 11/21/2021 1926   LABSPEC 1.008 11/21/2021 1926   PHURINE 5.0 11/21/2021 1926   GLUCOSEU NEGATIVE 11/21/2021 1926   HGBUR SMALL (A) 11/21/2021 1926   BILIRUBINUR NEGATIVE 11/21/2021 1926   BILIRUBINUR negative 09/24/2019 1742   KETONESUR NEGATIVE 11/21/2021 1926   PROTEINUR 30 (A) 11/21/2021 1926   UROBILINOGEN 1.0 09/24/2019 1742   UROBILINOGEN 1.0 10/26/2014 0249   NITRITE NEGATIVE 11/21/2021 1926   LEUKOCYTESUR NEGATIVE 11/21/2021 1926    Radiological Exams on Admission: DG Chest Port 1 View  Result Date: 02/12/2022 CLINICAL DATA:  CHF and 2 leaky valves. Shortness of breath at rest since last evening. EXAM: PORTABLE CHEST 1 VIEW COMPARISON:  02/06/2022 FINDINGS: Previous median sternotomy and left atrial clipping. Unchanged cardiac enlargement. New pulmonary vascular congestion without frank edema. No convincing evidence for pleural effusion. No airspace consolidation. IMPRESSION: New pulmonary vascular congestion. Electronically Signed   By: Signa Kell M.D.   On: 02/12/2022 12:31    EKG: Independently reviewed.  With some PVCs, prolonged QT interval,?   A-fib,  Assessment/Plan Principal Problem:   Acute on chronic combined systolic and diastolic CHF (congestive heart failure) (HCC) Active Problems:   Diabetes mellitus type 2 in obese (HCC)   Acute on chronic systolic CHF (congestive heart failure) (HCC)   Biventricular heart failure with reduced left ventricular function (HCC)   AF (paroxysmal atrial fibrillation) (HCC)   Essential hypertension   Type 2 diabetes mellitus with hyperlipidemia (HCC)   Depression   Class 3 obesity (HCC)   NICM (nonischemic cardiomyopathy) (HCC)   S/P mitral valve repair   Anxiety and depression   GERD (gastroesophageal reflux disease)   CHF (congestive heart failure) (HCC)   HFrEF (heart failure with reduced ejection fraction) (HCC)    #1 acute on chronic combined systolic diastolic heart failure- -Patient presenting with 1 day history of worsening orthopnea, paroxysmal nocturnal dyspnea, chest pressure, lower extremity edema, 5 pound weight gain per patient in 2 days.  Patient noted on prior discharge to a weight of 135.4 kg and on presentation today weight up to 139.7 kg.  BNP elevated. -Chest x-ray concerning for volume overload. -Patient with multiple hospitalizations, recently consulted on by cardiology on 02/07/2022. -Patient with complex cardiac history and received a dose of 40 mg IV x1 in the ED prior to transfer to [facility]. -Cardiac enzymes negative x2. -Patient with recent 2D echo 11/22/2021 with EF of 35 to 40%, left ventricular global hypokinesis, right ventricular systolic function moderately  reduced, moderately enlarged right ventricular size, moderately elevated PA systolic pressure, severely dilated left atrial size, severely dilated right atrial size, trivial MVR. -TEE ED done 01/20/2022 with severe biventricular dysfunction, trivial MR, severe TR. -We will place on Lasix 80 mg IV every 12 hours, continue home regimen Toprol-XL 25 mg twice daily, continue Zaroxolyn 2.5 mg every  Monday and Thursday, statin, strict I's and O's, daily weights. -Consult cardiology for further evaluation and management. -May need input from heart failure team.  2.  Paroxysmal atrial fibrillation/atrial flutter -Patient currently in atrial fibrillation. -Patient noted to have failed Tikosyn in the past. -Continue home regimen amiodarone, Eliquis for anticoagulation. - 3.  CKD stage IV -Patient noted to have a new baseline of approximately 2.6-2.8. -Creatinine currently at 2.25 today. -Monitor renal function with diuresis. -Outpatient follow-up with nephrology.  4.  CAD -Patient with complaints of chest pressure on presentation however high-sensitivity troponins are negative x2. -Chest pressure likely secondary to volume overload. -Patient noted to have a cardiac cath in 2019 with mild nonobstructive CAD. -Continue Lopressor, statin, Eliquis.  5.  Diabetes mellitus type 2 -Hemoglobin A1c 6.7 (11/21/2021) -Patient noted to be on Trulicity prior to admission. -Placed on sliding scale insulin.  6.  OSA -Patient currently awaiting split-night sleep study to titrate CPAP. -CPAP nightly.  7.  Class III obesity (BMI 48.24 kg/m2 -Lifestyle modification. -Outpatient follow-up with PCP.  8.  GERD -PPI.  9.  Hyperlipidemia -Statin.  10.  Depression/anxiety -Resume home regimen Cymbalta, BuSpar.   DVT prophylaxis: Eliquis Code Status:   Full Family Communication:  Updated patient.  No family at bedside. Disposition Plan:   Patient is from:  Home  Anticipated DC to:  TBD  Anticipated DC date:  TBD  Anticipated DC barriers: Clinical improvement  Consults called:  Cardiology [place]  Admission status:  Admit to inpatient.  Severity of Illness: The appropriate patient status for this patient is INPATIENT. Inpatient status is judged to be reasonable and necessary in order to provide the required intensity of service to ensure the patient's safety. The patient's presenting  symptoms, physical exam findings, and initial radiographic and laboratory data in the context of their chronic comorbidities is felt to place them at high risk for further clinical deterioration. Furthermore, it is not anticipated that the patient will be medically stable for discharge from the hospital within 2 midnights of admission.   * I certify that at the point of admission it is my clinical judgment that the patient will require inpatient hospital care spanning beyond 2 midnights from the point of admission due to high intensity of service, high risk for further deterioration and high frequency of surveillance required.*    Ramiro Harvest MD [facility]  How to contact the [facility] Attending or Consulting provider 7A - 7P or covering provider during after hours 7P -7A, for this patient?   Check the care team in [facility] and look for a) attending/consulting [facility] provider listed and b) the [facility] team listed Log into [email or web address] and use [place]'s universal password to access. If you do not have the password, please contact the hospital operator. Locate the [facility] provider you are looking for under [facility] and page to a number that you can be directly reached. If you still have difficulty reaching the provider, please page the [facility] (Director on Call) for the Hospitalists listed on amion for assistance.  02/12/2022, 6:32 PM

## 2022-02-13 DIAGNOSIS — I480 Paroxysmal atrial fibrillation: Secondary | ICD-10-CM

## 2022-02-13 DIAGNOSIS — I5043 Acute on chronic combined systolic (congestive) and diastolic (congestive) heart failure: Secondary | ICD-10-CM

## 2022-02-13 DIAGNOSIS — E1169 Type 2 diabetes mellitus with other specified complication: Secondary | ICD-10-CM | POA: Diagnosis not present

## 2022-02-13 DIAGNOSIS — I50813 Acute on chronic right heart failure: Secondary | ICD-10-CM

## 2022-02-13 DIAGNOSIS — E669 Obesity, unspecified: Secondary | ICD-10-CM | POA: Diagnosis not present

## 2022-02-13 LAB — BASIC METABOLIC PANEL
Anion gap: 6 (ref 5–15)
BUN: 20 mg/dL (ref 8–23)
CO2: 25 mmol/L (ref 22–32)
Calcium: 8.6 mg/dL — ABNORMAL LOW (ref 8.9–10.3)
Chloride: 108 mmol/L (ref 98–111)
Creatinine, Ser: 2.4 mg/dL — ABNORMAL HIGH (ref 0.44–1.00)
GFR, Estimated: 22 mL/min — ABNORMAL LOW (ref >60)
Glucose, Bld: 100 mg/dL — ABNORMAL HIGH (ref 70–99)
Potassium: 3.6 mmol/L (ref 3.5–5.1)
Sodium: 139 mmol/L (ref 135–145)

## 2022-02-13 LAB — GLUCOSE, CAPILLARY
Glucose-Capillary: 104 mg/dL — ABNORMAL HIGH (ref 70–99)
Glucose-Capillary: 124 mg/dL — ABNORMAL HIGH (ref 70–99)
Glucose-Capillary: 126 mg/dL — ABNORMAL HIGH (ref 70–99)
Glucose-Capillary: 172 mg/dL — ABNORMAL HIGH (ref 70–99)

## 2022-02-13 LAB — URINALYSIS, ROUTINE W REFLEX MICROSCOPIC
Bilirubin Urine: NEGATIVE
Glucose, UA: NEGATIVE mg/dL
Hgb urine dipstick: NEGATIVE
Ketones, ur: NEGATIVE mg/dL
Leukocytes,Ua: NEGATIVE
Nitrite: NEGATIVE
Protein, ur: NEGATIVE mg/dL
Specific Gravity, Urine: 1.008 (ref 1.005–1.030)
pH: 5 (ref 5.0–8.0)

## 2022-02-13 LAB — MAGNESIUM: Magnesium: 1.9 mg/dL (ref 1.7–2.4)

## 2022-02-13 MED ORDER — FUROSEMIDE 10 MG/ML IJ SOLN
120.0000 mg | Freq: Two times a day (BID) | INTRAVENOUS | Status: DC
  Filled 2022-02-13: qty 12

## 2022-02-13 MED ORDER — FUROSEMIDE 10 MG/ML IJ SOLN
120.0000 mg | Freq: Two times a day (BID) | INTRAMUSCULAR | Status: DC
  Administered 2022-02-13: 120 mg via INTRAVENOUS
  Filled 2022-02-13: qty 12
  Filled 2022-02-13: qty 10
  Filled 2022-02-13: qty 12

## 2022-02-13 MED ORDER — FUROSEMIDE 10 MG/ML IJ SOLN
120.0000 mg | INTRAVENOUS | Status: AC
  Administered 2022-02-13: 120 mg via INTRAVENOUS
  Filled 2022-02-13: qty 10

## 2022-02-13 NOTE — Progress Notes (Signed)
Heart Failure Navigator Progress Note  Assessed for Heart & Vascular TOC clinic readiness.  Patient does not meet criteria due to Advanced Heart Failure patient.    [name], BSN, RN Heart Failure Nurse Navigator Secure Chat Only   

## 2022-02-13 NOTE — Progress Notes (Signed)
Mobility Specialist Progress Note    02/13/22 1407  Mobility  Activity Refused mobility   Pt stated she has already been up and would like to start her lasix. Will f/u as schedule permits.   [place] Mobility Specialist

## 2022-02-13 NOTE — Telephone Encounter (Signed)
Patient is in the hospital

## 2022-02-14 ENCOUNTER — Encounter (HOSPITAL_COMMUNITY)
Admission: EM | Disposition: A | Discharge status: Home / Self Care | Payer: Self-pay | Source: Home / Self Care | Attending: Internal Medicine

## 2022-02-14 DIAGNOSIS — E1169 Type 2 diabetes mellitus with other specified complication: Secondary | ICD-10-CM | POA: Diagnosis not present

## 2022-02-14 DIAGNOSIS — I480 Paroxysmal atrial fibrillation: Secondary | ICD-10-CM | POA: Diagnosis not present

## 2022-02-14 DIAGNOSIS — I50813 Acute on chronic right heart failure: Secondary | ICD-10-CM | POA: Diagnosis not present

## 2022-02-14 DIAGNOSIS — I4892 Unspecified atrial flutter: Secondary | ICD-10-CM | POA: Diagnosis not present

## 2022-02-14 DIAGNOSIS — E785 Hyperlipidemia, unspecified: Secondary | ICD-10-CM | POA: Diagnosis not present

## 2022-02-14 DIAGNOSIS — E669 Obesity, unspecified: Secondary | ICD-10-CM | POA: Diagnosis not present

## 2022-02-14 DIAGNOSIS — I5023 Acute on chronic systolic (congestive) heart failure: Secondary | ICD-10-CM | POA: Diagnosis not present

## 2022-02-14 HISTORY — PX: RIGHT HEART CATH: CATH118263

## 2022-02-14 LAB — POCT I-STAT EG7
Acid-Base Excess: 2 mmol/L (ref 0.0–2.0)
Acid-Base Excess: 2 mmol/L (ref 0.0–2.0)
Bicarbonate: 27.2 mmol/L (ref 20.0–28.0)
Bicarbonate: 27.8 mmol/L (ref 20.0–28.0)
Calcium, Ion: 1.21 mmol/L (ref 1.15–1.40)
Calcium, Ion: 1.22 mmol/L (ref 1.15–1.40)
HCT: 28 % — ABNORMAL LOW (ref 36.0–46.0)
HCT: 28 % — ABNORMAL LOW (ref 36.0–46.0)
Hemoglobin: 9.5 g/dL — ABNORMAL LOW (ref 12.0–15.0)
Hemoglobin: 9.5 g/dL — ABNORMAL LOW (ref 12.0–15.0)
O2 Saturation: 45 %
O2 Saturation: 49 %
Potassium: 3.5 mmol/L (ref 3.5–5.1)
Potassium: 3.7 mmol/L (ref 3.5–5.1)
Sodium: 140 mmol/L (ref 135–145)
Sodium: 141 mmol/L (ref 135–145)
TCO2: 29 mmol/L (ref 22–32)
TCO2: 29 mmol/L (ref 22–32)
pCO2, Ven: 45.6 mmHg (ref 44–60)
pCO2, Ven: 47.7 mmHg (ref 44–60)
pH, Ven: 7.374 (ref 7.25–7.43)
pH, Ven: 7.384 (ref 7.25–7.43)
pO2, Ven: 26 mmHg — CL (ref 32–45)
pO2, Ven: 27 mmHg — CL (ref 32–45)

## 2022-02-14 LAB — GLUCOSE, CAPILLARY
Glucose-Capillary: 128 mg/dL — ABNORMAL HIGH (ref 70–99)
Glucose-Capillary: 130 mg/dL — ABNORMAL HIGH (ref 70–99)
Glucose-Capillary: 149 mg/dL — ABNORMAL HIGH (ref 70–99)
Glucose-Capillary: 127 mg/dL — ABNORMAL HIGH (ref 70–99)

## 2022-02-14 LAB — BASIC METABOLIC PANEL
Anion gap: 9 (ref 5–15)
BUN: 24 mg/dL — ABNORMAL HIGH (ref 8–23)
CO2: 25 mmol/L (ref 22–32)
Calcium: 8.8 mg/dL — ABNORMAL LOW (ref 8.9–10.3)
Chloride: 102 mmol/L (ref 98–111)
Creatinine, Ser: 2.84 mg/dL — ABNORMAL HIGH (ref 0.44–1.00)
GFR, Estimated: 18 mL/min — ABNORMAL LOW (ref >60)
Glucose, Bld: 124 mg/dL — ABNORMAL HIGH (ref 70–99)
Potassium: 3.7 mmol/L (ref 3.5–5.1)
Sodium: 136 mmol/L (ref 135–145)

## 2022-02-14 SURGERY — RIGHT HEART CATH
Anesthesia: LOCAL

## 2022-02-14 MED ORDER — LIDOCAINE HCL (PF) 1 % IJ SOLN
INTRAMUSCULAR | Status: AC
  Filled 2022-02-14: qty 30

## 2022-02-14 MED ORDER — SODIUM CHLORIDE 0.9% FLUSH: 3.0000 mL | INTRAVENOUS | Status: DC | PRN

## 2022-02-14 MED ORDER — FUROSEMIDE 10 MG/ML IJ SOLN
120.0000 mg | Freq: Two times a day (BID) | INTRAVENOUS | Status: DC
  Administered 2022-02-14: 120 mg via INTRAVENOUS
  Filled 2022-02-14 (×3): qty 12

## 2022-02-14 MED ORDER — FUROSEMIDE 10 MG/ML IJ SOLN
120.0000 mg | INTRAVENOUS | Status: AC
  Administered 2022-02-14: 120 mg via INTRAVENOUS
  Filled 2022-02-14: qty 2

## 2022-02-14 MED ORDER — ACETAMINOPHEN 325 MG PO TABS: 650.0000 mg | ORAL_TABLET | ORAL | Status: DC | PRN

## 2022-02-14 MED ORDER — LABETALOL HCL 5 MG/ML IV SOLN: 10.0000 mg | INTRAVENOUS | Status: AC | PRN

## 2022-02-14 MED ORDER — HYDRALAZINE HCL 20 MG/ML IJ SOLN: 10.0000 mg | INTRAMUSCULAR | Status: AC | PRN

## 2022-02-14 MED ORDER — SODIUM CHLORIDE 0.9 % IV SOLN: 250.0000 mL | INTRAVENOUS | Status: DC | PRN

## 2022-02-14 MED ORDER — SODIUM CHLORIDE 0.9% FLUSH
3.0000 mL | Freq: Two times a day (BID) | INTRAVENOUS | Status: DC
  Administered 2022-02-15 – 2022-02-24 (×14): 3 mL via INTRAVENOUS

## 2022-02-14 MED ORDER — HEPARIN (PORCINE) IN NACL 1000-0.9 UT/500ML-% IV SOLN
INTRAVENOUS | Status: AC
  Filled 2022-02-14: qty 1000

## 2022-02-14 MED ORDER — ASPIRIN 81 MG PO CHEW
81.0000 mg | CHEWABLE_TABLET | ORAL | Status: AC
  Administered 2022-02-14: 81 mg via ORAL
  Filled 2022-02-14: qty 1

## 2022-02-14 MED ORDER — SODIUM CHLORIDE 0.9 % IV SOLN: INTRAVENOUS | Status: DC

## 2022-02-14 MED ORDER — SODIUM CHLORIDE 0.9% FLUSH
3.0000 mL | Freq: Two times a day (BID) | INTRAVENOUS | Status: DC
  Administered 2022-02-14 – 2022-02-24 (×10): 3 mL via INTRAVENOUS

## 2022-02-14 MED ORDER — POTASSIUM CHLORIDE CRYS ER 20 MEQ PO TBCR
40.0000 meq | EXTENDED_RELEASE_TABLET | Freq: Once | ORAL | Status: DC
Start: 2022-02-14 — End: 2022-02-14

## 2022-02-14 MED ORDER — LIDOCAINE HCL (PF) 1 % IJ SOLN
INTRAMUSCULAR | Status: DC | PRN
  Administered 2022-02-14: 2 mL

## 2022-02-14 MED ORDER — ASPIRIN 81 MG PO CHEW: 81.0000 mg | CHEWABLE_TABLET | ORAL | Status: DC

## 2022-02-14 MED ORDER — HEPARIN (PORCINE) IN NACL 1000-0.9 UT/500ML-% IV SOLN
INTRAVENOUS | Status: DC | PRN
  Administered 2022-02-14: 500 mL

## 2022-02-14 SURGICAL SUPPLY — 5 items
CATH BALLN WEDGE 5F 110CM (CATHETERS) ×1 IMPLANT
GUIDEWIRE .025 260CM (WIRE) ×1 IMPLANT
PACK CARDIAC CATHETERIZATION (CUSTOM PROCEDURE TRAY) ×2 IMPLANT
SHEATH GLIDE SLENDER 4/5FR (SHEATH) ×1 IMPLANT
TRANSDUCER W/STOPCOCK (MISCELLANEOUS) ×2 IMPLANT

## 2022-02-14 NOTE — TOC Initial Note (Addendum)
Transition of Care [facility]) - Initial/Assessment Note    Patient Details  Name: [name] MRN: [id number] Date of Birth: [birth year]  Transition of Care [facility]) CM/SW Contact:    Erenest Rasher, RN Phone Number: [phone] 02/14/2022, 5:16 PM  Clinical Narrative:                 HF TOC CM spoke to pt and states [facility] did not come out after her last visit. TOC CM contacted [facility] rep, Amy to follow up on referral. Pt states she has needed DME. She does not have CPAP at home. She had sleep study many years ago. Pt will need outpt sleep study.   Received message from [facility] that pt was not active. No further explanation of why HH did not come out.     Expected Discharge Plan: [place] Barriers to Discharge: Continued Medical Work up   Patient Goals and CMS Choice   CMS Medicare.gov Compare Post Acute Care list provided to:: Patient    Expected Discharge Plan and Services Expected Discharge Plan: [place]   Discharge Planning Services: CM Consult Post Acute Care Choice: [place] arrangements for the past 2 months: Apartment                                      Prior Living Arrangements/Services Living arrangements for the past 2 months: Apartment Lives with:: Significant Other Patient language and need for interpreter reviewed:: Yes Do you feel safe going back to the place where you live?: Yes      Need for Family Participation in Patient Care: No (Comment) Care giver support system in place?: No (comment) Current home services: DME (rollator, bedside commode, cane) Criminal Activity/Legal Involvement Pertinent to Current Situation/Hospitalization: No - Comment as needed  Activities of Daily Living Home Assistive Devices/Equipment: CBG Meter, Cane (specify quad or straight) ADL Screening (condition at time of admission) Patient's cognitive ability adequate to safely complete daily activities?: Yes Is  the patient deaf or have difficulty hearing?: No Does the patient have difficulty seeing, even when wearing glasses/contacts?: No Does the patient have difficulty concentrating, remembering, or making decisions?: No Patient able to express need for assistance with ADLs?: Yes Does the patient have difficulty dressing or bathing?: No Independently performs ADLs?: Yes (appropriate for developmental age) Does the patient have difficulty walking or climbing stairs?: No Weakness of Legs: Both Weakness of Arms/Hands: None  Permission Sought/Granted Permission sought to share information with : Case Manager, Family Supports, PCP Permission granted to share information with : Yes, Verbal Permission Granted  Share Information with NAME: Samiya     Permission granted to share info w Relationship: daughter  Permission granted to share info w Contact Information: [phone]  Emotional Assessment Appearance:: Appears stated age Attitude/Demeanor/Rapport: Engaged Affect (typically observed): Accepting Orientation: : Oriented to Self, Oriented to Place, Oriented to  Time, Oriented to Situation   Psych Involvement: No (comment)  Admission diagnosis:  CHF (congestive heart failure) (HCC) [I50.9] Nonspecific chest pain [R07.9] HFrEF (heart failure with reduced ejection fraction) (HCC) [I50.20] Acute congestive heart failure, unspecified heart failure type [facility]) [I50.9] Patient Active Problem List   Diagnosis Date Noted   Depression 01/21/2022   Acute kidney injury superimposed on chronic kidney disease ([place]) 01/18/2022   Chronic deep vein thrombosis (DVT) of other vein of right upper  extremity ([place]) 12/29/2021   Abnormal weight loss 11/16/2021   Personal history of colonic polyps 11/16/2021   Rib pain 11/16/2021   Anxiety and depression 08/26/2021   Essential hypertension 08/26/2021   GERD (gastroesophageal reflux disease) 08/26/2021   COPD (chronic obstructive pulmonary disease) ([place])  07/29/2021   Prolonged QT interval 07/29/2021   Biventricular heart failure ([place])    Atrial flutter ([place])    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   Encounter for preoperative dental examination    Teeth missing    Gingivitis    Accretions on teeth    Atrial fibrillation, permanent ([place]) 11/02/2020   Acute on chronic right heart failure ([place]) 05/06/2020   NICM (nonischemic cardiomyopathy) ([place]) 04/01/2020   Family history of heart disease 04/01/2020   Mitral regurgitation 04/01/2020   Acute on chronic left systolic heart failure ([place]) 07/01/2018   Congestive heart failure with left ventricular diastolic dysfunction, acute ([place]) 07/01/2018   Chronic atrial fibrillation ([place]) 87/56/4332   Diastolic dysfunction 95/18/8416   Diabetes mellitus type 2 in obese ([place]) 12/26/2017   Hypoxia 09/21/2011   Type 2 diabetes mellitus with hyperlipidemia ([place]) 02/04/2010   Class 3 obesity ([place]) 02/04/2010   AF (paroxysmal atrial fibrillation) ([place]) 02/04/2010   Gastroparesis 02/04/2010   PCP:  [name], NP Pharmacy:   [id number] [place] [place]Phone: [phone]: [phone] [facility]Phone: [phone]: [phone] [facility] [address]Phone: [phone]: [phone] [place], [place] [place] ([place] [address]Phone: [phone]: [phone]    Social Determinants of Health (SDOH) Interventions    Readmission Risk Interventions    01/20/2022    5:06 PM 08/31/2021    1:49 PM 02/10/2021   10:17 AM  Readmission Risk Prevention Plan  Transportation Screening Complete Complete Complete  Medication Review (RN Care  Manager)  Referral to Pharmacy Complete  PCP or Specialist appointment within 3-5 days of discharge Complete Complete Complete  HRI or Home Care Consult  Complete Complete  SW Recovery Care/Counseling Consult Complete Complete Complete  Palliative Care Screening Not Applicable Not Applicable Not [place] Applicable Not Applicable Not Applicable

## 2022-02-14 NOTE — Interval H&P Note (Signed)
History and Physical Interval Note:  02/14/2022 12:11 PM  [name]  has presented today for surgery, with the diagnosis of Heart Failure.  The various methods of treatment have been discussed with the patient and family. After consideration of risks, benefits and other options for treatment, the patient has consented to  Procedure(s): RIGHT HEART CATH (N/A) as a surgical intervention.  The patient's history has been reviewed, patient examined, no change in status, stable for surgery.  I have reviewed the patient's chart and labs.  Questions were answered to the patient's satisfaction.     Lenn Volker

## 2022-02-14 NOTE — Progress Notes (Signed)
PT Cancellation Note  Patient Details Name: [name] MRN: [id number] DOB: 09-May-1956   Cancelled Treatment:    Reason Eval/Treat Not Completed: Patient at procedure or test/unavailable. She is off the floor at this time. PT to continue with attempts as appropriate.   Donna Bernard, PT, MPT  Ina Homes 02/14/2022, 12:34 PM

## 2022-02-15 ENCOUNTER — Encounter (HOSPITAL_COMMUNITY): Payer: Self-pay | Admitting: Internal Medicine

## 2022-02-15 DIAGNOSIS — I50813 Acute on chronic right heart failure: Secondary | ICD-10-CM | POA: Diagnosis not present

## 2022-02-15 LAB — URINE CULTURE: Culture: >=100000 — AB

## 2022-02-15 LAB — BASIC METABOLIC PANEL
Anion gap: 10 (ref 5–15)
BUN: 27 mg/dL — ABNORMAL HIGH (ref 8–23)
CO2: 27 mmol/L (ref 22–32)
Calcium: 8.8 mg/dL — ABNORMAL LOW (ref 8.9–10.3)
Chloride: 101 mmol/L (ref 98–111)
Creatinine, Ser: 2.8 mg/dL — ABNORMAL HIGH (ref 0.44–1.00)
GFR, Estimated: 18 mL/min — ABNORMAL LOW (ref >60)
Glucose, Bld: 128 mg/dL — ABNORMAL HIGH (ref 70–99)
Potassium: 3.4 mmol/L — ABNORMAL LOW (ref 3.5–5.1)
Sodium: 138 mmol/L (ref 135–145)

## 2022-02-15 LAB — GLUCOSE, CAPILLARY
Glucose-Capillary: 117 mg/dL — ABNORMAL HIGH (ref 70–99)
Glucose-Capillary: 130 mg/dL — ABNORMAL HIGH (ref 70–99)
Glucose-Capillary: 155 mg/dL — ABNORMAL HIGH (ref 70–99)
Glucose-Capillary: 151 mg/dL — ABNORMAL HIGH (ref 70–99)

## 2022-02-15 MED ORDER — POTASSIUM CHLORIDE CRYS ER 20 MEQ PO TBCR
60.0000 meq | EXTENDED_RELEASE_TABLET | Freq: Once | ORAL | Status: AC
Start: 2022-02-15 — End: 2022-02-15
  Administered 2022-02-15: 60 meq via ORAL
  Filled 2022-02-15: qty 3

## 2022-02-15 MED ORDER — SENNOSIDES-DOCUSATE SODIUM 8.6-50 MG PO TABS
1.0000 | ORAL_TABLET | Freq: Two times a day (BID) | ORAL | Status: DC
  Administered 2022-02-15 – 2022-02-24 (×18): 1 via ORAL
  Filled 2022-02-15 (×19): qty 1

## 2022-02-15 MED ORDER — POLYETHYLENE GLYCOL 3350 17 G PO PACK
17.0000 g | PACK | Freq: Every day | ORAL | Status: DC
  Administered 2022-02-15 – 2022-02-18 (×4): 17 g via ORAL
  Filled 2022-02-15 (×8): qty 1

## 2022-02-15 MED ORDER — METOLAZONE 2.5 MG PO TABS
2.5000 mg | ORAL_TABLET | Freq: Once | ORAL | Status: AC
  Administered 2022-02-15: 2.5 mg via ORAL
  Filled 2022-02-15: qty 1

## 2022-02-15 MED ORDER — DEXTROSE 5 % IV SOLN
160.0000 mg | Freq: Two times a day (BID) | INTRAVENOUS | Status: DC
  Administered 2022-02-15 – 2022-02-21 (×14): 160 mg via INTRAVENOUS
  Filled 2022-02-15: qty 16
  Filled 2022-02-15: qty 10
  Filled 2022-02-15: qty 16
  Filled 2022-02-15 (×3): qty 10
  Filled 2022-02-15: qty 16
  Filled 2022-02-15: qty 10
  Filled 2022-02-15: qty 16
  Filled 2022-02-15 (×2): qty 10
  Filled 2022-02-15 (×2): qty 16
  Filled 2022-02-15: qty 10
  Filled 2022-02-15: qty 2
  Filled 2022-02-15 (×2): qty 16

## 2022-02-15 NOTE — Care Management Important Message (Signed)
Important Message  Patient Details  Name: [name] MRN: [id number] Date of Birth: 06-17-1956   Medicare Important Message Given:  Yes     Shelda Altes 02/15/2022, 8:01 AM

## 2022-02-15 NOTE — Progress Notes (Signed)
Mobility Specialist Progress Note:   02/15/22 1025  Mobility  Activity Ambulated with assistance in hallway  Level of Assistance Standby assist, set-up cues, supervision of patient - no hands on  Assistive Device Front wheel walker  Distance Ambulated (ft) 200 ft (100+100)  Activity Response Tolerated well  $Mobility charge 1 Mobility   Pt received In chair willing to participate in mobility. No complaints of pain. Required 1 short standing break. Left in chair with call bell in reach and all needs met.   [facility] Shavontae Gibeault Mobility Specialist

## 2022-02-15 NOTE — Progress Notes (Addendum)
Advanced Heart Failure Rounding Note  PCP-Cardiologist: Buford Dresser, MD   Subjective:    RHC 06/27 Markedly elevated biventricular filling pressures, PA sat low but fick CO preserved Findings:   RA = 27  RV = 62/25 PA = 60/27 (41) PCW = 25 (v = 35) Fick cardiac output/index = 6.8/2.8 PVR = 2.4 WU Ao sat = 90% PA sat = 45%, 49% PAPi = 1.2  2.6L UOP yesterday with IV lasix 120 BID yesterday.  Scr trending up, 2.25>>2.40>>2.84>>2.80  K 3.4     Objective:   Weight Range: (!) 137.7 kg Body mass index is 47.53 kg/m.   Vital Signs:   Temp:  [97.2 F (36.2 C)-98.3 F (36.8 C)] 98.2 F (36.8 C) (06/28 0800) Pulse Rate:  [76-86] 85 (06/28 0800) Resp:  [17-23] 19 (06/28 0800) BP: (109-128)/(59-96) 118/62 (06/28 0800) SpO2:  [85 %-100 %] 97 % (06/28 0800) Weight:  [137.7 kg] 137.7 kg (06/28 0338) Last BM Date : 02/13/22  Weight change: Filed Weights   02/13/22 0500 02/14/22 0438 02/15/22 0338  Weight: (!) 141.6 kg (!) 137.8 kg (!) 137.7 kg    Intake/Output:   Intake/Output Summary (Last 24 hours) at 02/15/2022 0847 Last data filed at 02/15/2022 0800 Gross per 24 hour  Intake 150 ml  Output 3200 ml  Net -3050 ml      Physical Exam    General:  Sitting up in bed. HEENT: normal Neck: supple. JVP to ear. Carotids 2+ bilat; no bruits.  Cor: PMI nondisplaced. Regular rate & rhythm. No rubs, gallops, 3/6 TR murmur Lungs: clear Abdomen: soft, nontender, + distended.  Extremities: no cyanosis, clubbing, rash, 2+ dema Neuro: alert & orientedx3, cranial nerves grossly intact. moves all 4 extremities w/o difficulty. Affect pleasant   Telemetry    SR 80s, < 5 PVCs/min  EKG    N/A   Labs    CBC Recent Labs    02/12/22 1226 02/14/22 1303 02/14/22 1304  WBC 5.3  --   --   HGB 8.1* 9.5* 9.5*  HCT 27.3* 28.0* 28.0*  MCV 84.8  --   --   PLT 202  --   --    Basic Metabolic Panel Recent Labs    02/12/22 1226 02/13/22 0325  02/14/22 0218 02/14/22 1303 02/14/22 1304 02/15/22 0326  NA 141 139 136   < > 141 138  K 3.9 3.6 3.7   < > 3.5 3.4*  CL 105 108 102  --   --  101  CO2 '25 25 25  '$ --   --  27  GLUCOSE 102* 100* 124*  --   --  128*  BUN 21 20 24*  --   --  27*  CREATININE 2.25* 2.40* 2.84*  --   --  2.80*  CALCIUM 9.6 8.6* 8.8*  --   --  8.8*  MG 1.8 1.9  --   --   --   --    < > = values in this interval not displayed.   Liver Function Tests No results for input(s): "AST", "ALT", "ALKPHOS", "BILITOT", "PROT", "ALBUMIN" in the last 72 hours. No results for input(s): "LIPASE", "AMYLASE" in the last 72 hours. Cardiac Enzymes No results for input(s): "CKTOTAL", "CKMB", "CKMBINDEX", "TROPONINI" in the last 72 hours.  BNP: BNP (last 3 results) Recent Labs    01/26/22 1156 02/06/22 1809 02/12/22 1226  BNP 375.6* 550.8* 517.3*    ProBNP (last 3 results) No results for input(s): "PROBNP" in  the last 8760 hours.   D-Dimer No results for input(s): "DDIMER" in the last 72 hours. Hemoglobin A1C No results for input(s): "HGBA1C" in the last 72 hours. Fasting Lipid Panel No results for input(s): "CHOL", "HDL", "LDLCALC", "TRIG", "CHOLHDL", "LDLDIRECT" in the last 72 hours. Thyroid Function Tests No results for input(s): "TSH", "T4TOTAL", "T3FREE", "THYROIDAB" in the last 72 hours.  Invalid input(s): "FREET3"  Other results:   Imaging    CARDIAC CATHETERIZATION  Result Date: 02/14/2022 Findings: RA = 27 RV = 62/25 PA = 60/27 (41) PCW = 25 (v = 35) Fick cardiac output/index = 6.8/2.8 PVR = 2.4 WU Ao sat = 90% PA sat = 45%, 49% PAPi = 1.2 Assessment: 1. Markedly elevated biventricular pressures 2. Low PAPI suggestive of RV dysfunction 3. PA sats are low but Fick CO maintained Plan/Discussion: Diurese further as renal function tolerates. Glori Bickers, MD 2:29 PM    Medications:     Scheduled Medications:  amiodarone  200 mg Oral Daily   apixaban  5 mg Oral BID   busPIRone  5 mg Oral BID    colchicine  0.3 mg Oral Daily   cycloSPORINE  1 drop Both Eyes BID   docusate sodium  100 mg Oral BID   DULoxetine  60 mg Oral Daily   fluticasone  2 spray Each Nare Daily   insulin aspart  0-9 Units Subcutaneous TID WC   loratadine  10 mg Oral Daily   metolazone  2.5 mg Oral Once per day on Mon Thu   metoprolol succinate  25 mg Oral Daily   multivitamin with minerals   Oral Daily   pantoprazole  40 mg Oral Daily   potassium chloride SA  60 mEq Oral Daily   rosuvastatin  10 mg Oral QPM   sodium chloride flush  3 mL Intravenous Q12H   sodium chloride flush  3 mL Intravenous Q12H   sodium chloride flush  3 mL Intravenous Q12H    Infusions:  sodium chloride     sodium chloride     furosemide 120 mg (02/14/22 2113)    PRN Medications: sodium chloride, sodium chloride, acetaminophen, albuterol, nitroGLYCERIN, ondansetron (ZOFRAN) IV, oxyCODONE, sodium chloride flush, sodium chloride flush    Patient Profile   66 y.o. female with history of chronic biventricular systolic CHF, PAF/AFL, CKD IV, CAD, valvular heart disease, DMII, OSA. Multiple admissions for decompensated HF mostly in setting of recurrent AF.    Now presenting with a/c CHF.   Assessment/Plan     1. Acute on chronic Biventricular Systolic Heart Failure: - RHC (6/22) with low filling pressures with moderately reduced CO. - Echo (7/22) in setting of AFL with RVR, showed EF 30-35%, moderate LVH, moderate RV dysfunction, PASP 48, s/p MV repair with mild MR and mean gradient 10 mmHg with HR around 130, s/p TV repair with mild-moderate TR, dilated IVC.   - RHC 4/23 markedly elevated biventricular pressures - Echo 4/23 35-40% RV moderately down severe TR. Moderate MS mean grad 85m HG - TEE 06/02: Severe biventricular dysfunction, trivial MR, mean gradient 3 across MV prosthesis, severe TR - Multiple admissions recently for HF mostly occur in setting of recurrent AF. Currently in NSR. - RHC 06/27 markedly elevated  biventricular pressures, low PAPi, PA sats low but preserved Fick CO. - NYHA IIIb/IV. Diuresing okay with IV lasix but still significantly overloaded. Continue IV lasix 120 BID and give 2.5 mg metolazone. Supp K. - In and out of AFL at recent visits. Maintaining  SR today - Apply UNNA boots - GDMT limited by CKD.  - No Bidil for now.  Avoid hypotension w/ CKD  - Continue toprol xl 25 mg daily  - Worry may have end-stage disease. At some point, HD may be only way to manage volume status.   2. PAF/AFL - She has failed Tikosyn. - She had Maze in 5/22 and DCCV in 6/22 & 04/19/21, last of which she had ERAF. - Multiple admissions recently for HF mostly occur in setting of recurrent AF/AFL. Last DCCV 06/13.  - Currently maintaining SR - Continue amiodarone + Eliquis  - Unable to tolerate AF at all. Follows with EP. Not a candidate for Afib ablation or AV nodal ablation/PPM - Has OSA, awaiting split night study for CPAP titration.  - Keep K > 4.0 and Mg > 2.0    3. CKD Stage IV   - New baseline creatinine  ~ 2.6 - 2.8. Stabilized around 3.1 during recent admit - Scr 2.3 on admit, 2.4>>2.8>>2.8 - Follow with diuresis - No SGLT2i, GFR has been consistently less than 20 - Follows with Nephrology as outpatient - May be nearing need for HD    4. CAD - Cath (2019): Mild nonobstructive CAD - No chest pain.  - Continue statin + beta blocker - no ASA w/ Eliquis    5. Valvular Heart Disease: - S/p MV and TV repair in 5/22.  - Echo 02/25/21 with mild-moderate TR.   - Echo 4/23 35-40% RV moderately down. severe TR. Moderate MS mean gradient 32mHG  - TEE 06/23: severe biventricular dysfunction, trivial MR, mean gradient 3 across MV prosthesis, severe TR - Seen by structural heart team. Not a candidate for valve in valve tricuspid replacement d/t severe left heart failure   6. Obesity - Body mass index is 48 - Discussed portion control.    7. DMII - Hgb A1c 6.7% 04/23 - GFR has been too low for  SGLT2i   8. OSA - initial sleep study positive but was awaiting split night study to titrate CPAP - Has been referred to Dr. TRadford Pax- suspect this is part of her AF recurrence   9. PVCs - Occasional - Continue PO amiodarone   Continue to mobilize.  PT/OT.   Length of Stay: 3  FINCH, LINDSAY N, PA-C  02/15/2022, 8:47 AM  Advanced Heart Failure Team Pager [phone](M-F; 7a - 5p)  Please contact [facility] for night-coverage after hours (5p -7a ) and weekends on [email or web address]   Patient seen and examined with the above-signed Advanced Practice Provider and/or Housestaff. I personally reviewed laboratory data, imaging studies and relevant notes. I independently examined the patient and formulated the important aspects of the plan. I have edited the note to reflect any of my changes or salient points. I have personally discussed the plan with the patient and/or family.  Results of RHC yesterday discussed. Still with markedly elevated filling pressures. Decent urine out on IV lasix but weight unchanged. SCr up but stable today. Feels blaoted. No orthopnea or PND/   General: Sitting up  No resp difficulty HEENT: normal Neck: supple.JVP ot ear . Carotids 2+ bilat; no bruits. No lymphadenopathy or thryomegaly appreciated. Cor: PMI nondisplaced. Regular rate & rhythm. 2/6 TR Lungs: clear Abdomen: soft, nontender, nondistended. No hepatosplenomegaly. No bruits or masses. Good bowel sounds. Extremities: no cyanosis, clubbing, rash, tr edema Neuro: alert & orientedx3, cranial nerves grossly intact. moves all 4 extremities w/o difficulty. Affect pleasant  Very difficult situation with  severe valvular disease and cardiorenal syndrome. Continue to push diuresis with IV lasix and metolazone today. If unable to get more fluid off HD may be only answer.   Glori Bickers, MD  9:43 AM

## 2022-02-15 NOTE — Progress Notes (Signed)
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   Assessment & Plan:   Acute on chronic biventricular systolic heart failure, structural heart disease Echocardiogram 6/23 normal (Ciproxin, severe RV dysfunction, severe TR, status post MVR Right heart catheterization 6/27 with greatly elevated biventricular pressures Currently attempting diuresis on Lasix 160 mg twice daily, metolazone along with potassium replacements. Followed by advanced heart failure team. She remains on metoprolol. Urine output is 2625 last 24 hours.  Some clinical improvement but overall remains in difficult fluid balance status.  Paroxysmal A-fib: Failed Tikosyn.  Maze procedure on 5/22.  Multiple cardioversions.  Currently in sinus rhythm on amiodarone, therapeutic on Eliquis. Recently replaced electrolytes. Does have history of sleep apnea, does not have device at home.  She is in the process of getting a new sleep study.  Coronary artery disease: Continue on a statin, beta-blockers.  She is on Eliquis.  Type 2 diabetes, well controlled.  Trulicity at home.  Currently on insulin.  Obesity class III, BMI 48.  She will benefit with outpatient follow-up.  Depression/anxiety: On Cymbalta and BuSpar.  Continue.  CKD stage IV: Remains at about baseline.  Difficult fluid balance status.  May end up having dialysis as per cardiology.  Asymptomatic bacteriuria: Urine culture from 6/26 with E. coli.  Patient without obvious UTI symptoms.  Will not treat.    DVT prophylaxis:   apixaban (ELIQUIS) tablet 5 mg   Code Status: Full code Family Communication: None Disposition Plan: Status is: Inpatient Remains inpatient appropriate because: Significant fluid overload on advanced heart failure therapy     Consultants:  Heart failure team  Procedures:  Right heart cath  Antimicrobials:  None   Subjective: Patient seen and examined.  Somehow feels better.  Slept well last night.  Was able to get out of the bed with less shortness of breath.  No other overnight events.  Telemetry with sinus rhythm.  Objective: Vitals:   02/14/22 2300 02/15/22 0338 02/15/22 0800 02/15/22 1105  BP: 128/75 123/72 118/62 125/75  Pulse: 83 82 85 83  Resp: (!) '22 18 19 20  '$ Temp:  98.2 F (36.8 C) 98.2 F (36.8 C) (!) 97.5 F (36.4 C)  TempSrc:  Oral Oral Oral  SpO2: 99% 95% 97% 96%  Weight:  (!) 137.7 kg    Height:        Intake/Output Summary (Last 24 hours) at 02/15/2022 1252 Last data filed at 02/15/2022 1241 Gross per 24 hour  Intake 150 ml  Output 3750 ml  Net -3600 ml   Filed Weights   02/13/22 0500 02/14/22 0438 02/15/22 0338  Weight: (!) 141.6 kg (!) 137.8 kg (!) 137.7 kg    Examination:  General exam: Appears calm and comfortable.  Currently on room air. Respiratory system: No added sounds. Cardiovascular system: S1 & S2 heard, RRR.  Edematous and obese legs, no pitting edema.   Gastrointestinal system: Abdomen is nondistended, soft and nontender. No organomegaly or masses felt. Normal bowel sounds heard. Central nervous system: Alert and oriented. No focal neurological deficits. Extremities: Symmetric 5 x 5 power. Skin: No rashes, lesions or  ulcers Psychiatry: Judgement and insight appear normal. Mood & affect appropriate.     Data Reviewed: I have personally reviewed following labs and imaging studies  CBC: Recent Labs  Lab 02/12/22 1226 02/14/22 1303 02/14/22 1304  WBC 5.3  --   --   HGB 8.1* 9.5* 9.5*  HCT 27.3* 28.0* 28.0*  MCV 84.8   --   --   PLT 202  --   --    Basic Metabolic Panel: Recent Labs  Lab 02/12/22 1226 02/13/22 0325 02/14/22 0218 02/14/22 1303 02/14/22 1304 02/15/22 0326  NA 141 139 136 140 141 138  K 3.9 3.6 3.7 3.7 3.5 3.4*  CL 105 108 102  --   --  101  CO2 '25 25 25  '$ --   --  27  GLUCOSE 102* 100* 124*  --   --  128*  BUN 21 20 24*  --   --  27*  CREATININE 2.25* 2.40* 2.84*  --   --  2.80*  CALCIUM 9.6 8.6* 8.8*  --   --  8.8*  MG 1.8 1.9  --   --   --   --    GFR: Estimated Creatinine Clearance: 29.1 mL/min (A) (by C-G formula based on SCr of 2.8 mg/dL (H)). Liver Function Tests: No results for input(s): "AST", "ALT", "ALKPHOS", "BILITOT", "PROT", "ALBUMIN" in the last 168 hours. No results for input(s): "LIPASE", "AMYLASE" in the last 168 hours. No results for input(s): "AMMONIA" in the last 168 hours. Coagulation Profile: No results for input(s): "INR", "PROTIME" in the last 168 hours. Cardiac Enzymes: No results for input(s): "CKTOTAL", "CKMB", "CKMBINDEX", "TROPONINI" in the last 168 hours. BNP (last 3 results) No results for input(s): "PROBNP" in the last 8760 hours. HbA1C: No results for input(s): "HGBA1C" in the last 72 hours. CBG: Recent Labs  Lab 02/14/22 0600 02/14/22 1040 02/14/22 1604 02/14/22 2118 02/15/22 0603  GLUCAP 128* 130* 149* 127* 117*   Lipid Profile: No results for input(s): "CHOL", "HDL", "LDLCALC", "TRIG", "CHOLHDL", "LDLDIRECT" in the last 72 hours. Thyroid Function Tests: No results for input(s): "TSH", "T4TOTAL", "FREET4", "T3FREE", "THYROIDAB" in the last 72 hours. Anemia Panel: No results for input(s): "VITAMINB12", "FOLATE", "FERRITIN", "TIBC", "IRON", "RETICCTPCT" in the last 72 hours. Sepsis Labs: No results for input(s): "PROCALCITON", "LATICACIDVEN" in the last 168 hours.  Recent Results (from the past 240 hour(s))  SARS Coronavirus 2 by RT PCR (hospital order, performed in [facility] hospital lab) *cepheid single result test* Anterior  Nasal Swab     Status: None   Collection Time: 02/07/22  4:25 AM   Specimen: Anterior Nasal Swab  Result Value Ref Range Status   SARS Coronavirus 2 by RT PCR NEGATIVE NEGATIVE Final    Comment: (NOTE) SARS-CoV-2 target nucleic acids are NOT DETECTED.  The SARS-CoV-2 RNA is generally detectable in upper and lower respiratory specimens during the acute phase of infection. The lowest concentration of SARS-CoV-2 viral copies this assay can detect is 250 copies / mL. A negative result does not preclude SARS-CoV-2 infection and should not be used as the sole basis for treatment or other patient management decisions.  A negative result may occur with improper specimen collection / handling, submission of specimen other than nasopharyngeal swab, presence of viral mutation(s) within the areas targeted by this assay, and inadequate number of viral copies (<250 copies / mL). A negative result must be combined with clinical observations, patient history, and epidemiological information.  Fact Sheet for Patients:  https://www.patel.info/  Fact Sheet for Healthcare Providers: https://hall.com/  This test is not yet approved or  cleared by the [place] FDA and has been authorized for detection and/or diagnosis of SARS-CoV-2 by FDA under an Emergency Use Authorization (EUA).  This EUA will remain in effect (meaning this test can be used) for the duration of the COVID-19 declaration under Section 564(b)(1) of the Act, 21 U.S.C. section 360bbb-3(b)(1), unless the authorization is terminated or revoked sooner.  Performed at [facility], [place] [address], [place], [place] [address]   SARS Coronavirus 2 by RT PCR (hospital order, performed in [facility] hospital lab) *cepheid single result test* Anterior Nasal Swab     Status: None   Collection Time: 02/12/22 12:26 PM   Specimen: Anterior Nasal Swab  Result Value Ref Range Status   SARS  Coronavirus 2 by RT PCR NEGATIVE NEGATIVE Final    Comment: (NOTE) SARS-CoV-2 target nucleic acids are NOT DETECTED.  The SARS-CoV-2 RNA is generally detectable in upper and lower respiratory specimens during the acute phase of infection. The lowest concentration of SARS-CoV-2 viral copies this assay can detect is 250 copies / mL. A negative result does not preclude SARS-CoV-2 infection and should not be used as the sole basis for treatment or other patient management decisions.  A negative result may occur with improper specimen collection / handling, submission of specimen other than nasopharyngeal swab, presence of viral mutation(s) within the areas targeted by this assay, and inadequate number of viral copies (<250 copies / mL). A negative result must be combined with clinical observations, patient history, and epidemiological information.  Fact Sheet for Patients:   https://www.patel.info/  Fact Sheet for Healthcare Providers: https://hall.com/  This test is not yet approved or  cleared by the [place] FDA and has been authorized for detection and/or diagnosis of SARS-CoV-2 by FDA under an Emergency Use Authorization (EUA).  This EUA will remain in effect (meaning this test can be used) for the duration of the COVID-19 declaration under Section 564(b)(1) of the Act, 21 U.S.C. section 360bbb-3(b)(1), unless the authorization is terminated or revoked sooner.  Performed at [facility], [address] [place], [place] [address]   Urine Culture     Status: Abnormal   Collection Time: 02/13/22  8:09 AM   Specimen: Urine, Clean Catch  Result Value Ref Range Status   Specimen Description URINE, CLEAN CATCH  Final   Special Requests   Final    NONE Performed at [facility], [place] [address], [place],  [address]    Culture (A)  Final    >=100,000 COLONIES/mL ESCHERICHIA COLI 60,000 COLONIES/mL  ENTEROBACTER CLOACAE    Report Status 02/15/2022 FINAL  Final   Organism ID, Bacteria ESCHERICHIA COLI (A)  Final   Organism ID, Bacteria ENTEROBACTER CLOACAE (A)  Final      Susceptibility   Enterobacter cloacae - MIC*    CEFAZOLIN >=64 RESISTANT Resistant     CEFEPIME 2 SENSITIVE Sensitive     CIPROFLOXACIN <=0.25 SENSITIVE Sensitive     GENTAMICIN <=1 SENSITIVE Sensitive     IMIPENEM 0.5 SENSITIVE Sensitive     NITROFURANTOIN 32 SENSITIVE Sensitive     TRIMETH/SULFA <=20 SENSITIVE Sensitive     PIP/TAZO >=128 RESISTANT Resistant     * 60,000 COLONIES/mL ENTEROBACTER CLOACAE   Escherichia coli - MIC*    AMPICILLIN 4 SENSITIVE Sensitive     CEFAZOLIN <=4 SENSITIVE Sensitive     CEFEPIME <=0.12 SENSITIVE  Sensitive     CEFTRIAXONE <=0.25 SENSITIVE Sensitive     CIPROFLOXACIN <=0.25 SENSITIVE Sensitive     GENTAMICIN <=1 SENSITIVE Sensitive     IMIPENEM <=0.25 SENSITIVE Sensitive     NITROFURANTOIN 32 SENSITIVE Sensitive     TRIMETH/SULFA <=20 SENSITIVE Sensitive     AMPICILLIN/SULBACTAM <=2 SENSITIVE Sensitive     PIP/TAZO <=4 SENSITIVE Sensitive     * >=100,000 COLONIES/mL ESCHERICHIA COLI         Radiology Studies: CARDIAC CATHETERIZATION  Result Date: 02/14/2022 Findings: RA = 27 RV = 62/25 PA = 60/27 (41) PCW = 25 (v = 35) Fick cardiac output/index = 6.8/2.8 PVR = 2.4 WU Ao sat = 90% PA sat = 45%, 49% PAPi = 1.2 Assessment: 1. Markedly elevated biventricular pressures 2. Low PAPI suggestive of RV dysfunction 3. PA sats are low but Fick CO maintained Plan/Discussion: Diurese further as renal function tolerates. Glori Bickers, MD 2:29 PM       Scheduled Meds:  amiodarone  200 mg Oral Daily   apixaban  5 mg Oral BID   busPIRone  5 mg Oral BID   colchicine  0.3 mg Oral Daily   cycloSPORINE  1 drop Both Eyes BID   docusate sodium  100 mg Oral BID   DULoxetine  60 mg Oral Daily   fluticasone  2 spray Each Nare Daily   insulin aspart  0-9 Units Subcutaneous TID WC    loratadine  10 mg Oral Daily   metoprolol succinate  25 mg Oral Daily   multivitamin with minerals   Oral Daily   pantoprazole  40 mg Oral Daily   polyethylene glycol  17 g Oral Daily   potassium chloride SA  60 mEq Oral Daily   potassium chloride  60 mEq Oral Once   rosuvastatin  10 mg Oral QPM   senna-docusate  1 tablet Oral BID   sodium chloride flush  3 mL Intravenous Q12H   sodium chloride flush  3 mL Intravenous Q12H   sodium chloride flush  3 mL Intravenous Q12H   Continuous Infusions:  sodium chloride     sodium chloride     furosemide 160 mg (02/15/22 1053)     LOS: 3 days    Time spent: 35 minutes    Barb Merino, MD [facility] Pager [phone]

## 2022-02-15 NOTE — Progress Notes (Signed)
Orthopedic Tech Progress Note Patient Details:  [name] 12-Feb-1956 [id number]  Ortho Devices Type of Ortho Device: Louretta Parma boot Ortho Device/Splint Location: BLE Ortho Device/Splint Interventions: Ordered, Application, Adjustment   Post Interventions Patient Tolerated: Well Instructions Provided: Care of device  Nalea Salce OTR/L 02/15/2022, 11:54 AM

## 2022-02-15 NOTE — Progress Notes (Signed)
PT Cancellation Note  Patient Details Name: [name] MRN: [id number] DOB: 05-07-1956   Cancelled Treatment:    Reason Eval/Treat Not Completed: Patient declined, no reason specified. Pt declines PT treatment twice on this date, both times reporting recent administration of lasix or fluid pill and refusing to mobilize. PT will attempt to follow up as time allows.   Zenaida Niece 02/15/2022, 5:06 PM

## 2022-02-15 NOTE — Progress Notes (Signed)
Patient states she will place herself on her CPAP when she is ready.  RT will continue to monitor.

## 2022-02-16 DIAGNOSIS — I50813 Acute on chronic right heart failure: Secondary | ICD-10-CM | POA: Diagnosis not present

## 2022-02-16 LAB — BASIC METABOLIC PANEL
Anion gap: 9 (ref 5–15)
BUN: 33 mg/dL — ABNORMAL HIGH (ref 8–23)
CO2: 27 mmol/L (ref 22–32)
Calcium: 9 mg/dL (ref 8.9–10.3)
Chloride: 103 mmol/L (ref 98–111)
Creatinine, Ser: 2.9 mg/dL — ABNORMAL HIGH (ref 0.44–1.00)
GFR, Estimated: 17 mL/min — ABNORMAL LOW (ref >60)
Glucose, Bld: 104 mg/dL — ABNORMAL HIGH (ref 70–99)
Potassium: 3.8 mmol/L (ref 3.5–5.1)
Sodium: 139 mmol/L (ref 135–145)

## 2022-02-16 LAB — GLUCOSE, CAPILLARY
Glucose-Capillary: 119 mg/dL — ABNORMAL HIGH (ref 70–99)
Glucose-Capillary: 137 mg/dL — ABNORMAL HIGH (ref 70–99)
Glucose-Capillary: 125 mg/dL — ABNORMAL HIGH (ref 70–99)
Glucose-Capillary: 167 mg/dL — ABNORMAL HIGH (ref 70–99)

## 2022-02-16 LAB — MAGNESIUM: Magnesium: 1.9 mg/dL (ref 1.7–2.4)

## 2022-02-16 LAB — LIPOPROTEIN A (LPA): Lipoprotein (a): 59.6 nmol/L — ABNORMAL HIGH (ref <75.0)

## 2022-02-16 MED ORDER — POTASSIUM CHLORIDE CRYS ER 20 MEQ PO TBCR
60.0000 meq | EXTENDED_RELEASE_TABLET | Freq: Once | ORAL | Status: AC
Start: 2022-02-16 — End: 2022-02-16
  Administered 2022-02-16: 60 meq via ORAL
  Filled 2022-02-16: qty 3

## 2022-02-16 MED ORDER — METOLAZONE 2.5 MG PO TABS
2.5000 mg | ORAL_TABLET | Freq: Once | ORAL | Status: AC
Start: 2022-02-16 — End: 2022-02-16
  Administered 2022-02-16: 2.5 mg via ORAL
  Filled 2022-02-16: qty 1

## 2022-02-16 MED ORDER — MAGNESIUM SULFATE 2 GM/50ML IV SOLN
2.0000 g | Freq: Once | INTRAVENOUS | Status: AC
  Administered 2022-02-16: 2 g via INTRAVENOUS
  Filled 2022-02-16: qty 50

## 2022-02-16 NOTE — Progress Notes (Signed)
Visit to patients room to help administer CPAP for night rest.  Patient states she will self administer when she is ready for bed.  Patient reminded if she needs assistance to have RN call RT.

## 2022-02-16 NOTE — Consult Note (Signed)
Nephrology Consult   Requesting provider: Glori Bickers Service requesting consult: Heart Failure Reason for consult: CKD/volume overload   Assessment/Recommendations: [name] is a/an 66 y.o. female with a past medical history systolic congestive heart failure, valvular disease, atrial fibrillation, hypertension, diabetes, chronic kidney disease 4 who present w/ heart failure exacerbation   Systolic Heart Failure Exacerbation: Multiple hospitalizations for volume overload.  Some of them associated with atrial fibrillation.  Right heart cath with persistent elevated filling pressures.  Diuresing well at this time. -Continue with aggressive diuresis -Would pay less attention to creatinine and more focus on volume optimization -If creatinine worsens significantly with volume removal we can consider dialysis at that time.  Maintenance of euvolemia takes precedent over creatinine  CKD 4: It is likely her baseline is closer to 3-3.5 and that her creatinines of 2-2.5 are associated with dilution in the setting of volume overload. -continue with diuresis as above -Continue to monitor daily Cr, Dose meds for GFR -Monitor Daily I/Os, Daily weight  -Maintain MAP>65 for optimal renal perfusion.  -Avoid nephrotoxic medications including NSAIDs -Use synthetic opioids (Fentanyl/Dilaudid) if needed -Currently no indication for HD  Hyperlipidemia: On statin  Atrial fibrillation: Rate appropriate at this time on metoprolol  Anemia: Check iron stores tomorrow  Uncontrolled Diabetes Mellitus Type 2 with Hyperglycemia: Management per primary team   Recommendations conveyed to primary service.    [facility] 02/16/2022 10:19 AM   _____________________________________________________________________________________ CC: Heart failure exacerbation  History of Present Illness: [name] is a/an 66 y.o. female with a past medical history of systolic  congestive heart failure, valvular disease, atrial fibrillation, hypertension, diabetes, chronic kidney disease who presents with heart failure exacerbation.  Patient presented to the hospital 3 days ago with worsening shortness of breath and evidence of fluid gain on her body.  This has been a recurrent issue for months requiring multiple hospitalizations for IV diuretics.  She has undergone surgeries previously such as mitral valve and tricuspid valve repair as well as maze on 12/30/2020. She has had multiple admissions with worsening atrial fibrillation and she tolerates it poorly.  In the emergency department this time she had worsening lower extremity edema as well as a chest x-ray demonstrating pulmonary vascular congestion.  She was admitted for IV diuresis.  Since admission she has been receiving IV Lasix and has also received a couple doses of metolazone.  Initially her urine output was moderate but last night she made about 4.8 L of urine.  Her creatinine was 2.3 on arrival and is 2.9 today.  She had a right heart catheterization on 6/27 with markedly elevated biventricular filling pressures with preserved cardiac output.  Of note her creatinine has been closer to 3 when she was more volume optimized.   Medications:  Current Facility-Administered Medications  Medication Dose Route Frequency Provider Last Rate Last Admin   0.9 %  sodium chloride infusion  250 mL Intravenous PRN Bensimhon, Shaune Pascal, MD       0.9 %  sodium chloride infusion  250 mL Intravenous PRN Bensimhon, Shaune Pascal, MD       acetaminophen (TYLENOL) tablet 650 mg  650 mg Oral Q4H PRN Bensimhon, Shaune Pascal, MD       albuterol (PROVENTIL) (2.5 MG/3ML) 0.083% nebulizer solution 2.5 mg  2.5 mg Nebulization Q6H PRN Bensimhon, Shaune Pascal, MD       amiodarone (PACERONE) tablet 200 mg  200 mg Oral Daily Bensimhon, Shaune Pascal, MD   200  mg at 02/16/22 0839   apixaban (ELIQUIS) tablet 5 mg  5 mg Oral BID Bensimhon, Shaune Pascal, MD   5 mg at  02/16/22 1017   busPIRone (BUSPAR) tablet 5 mg  5 mg Oral BID Bensimhon, Shaune Pascal, MD   5 mg at 02/16/22 5102   colchicine tablet 0.3 mg  0.3 mg Oral Daily Bensimhon, Shaune Pascal, MD   0.3 mg at 02/15/22 0840   cycloSPORINE (RESTASIS) 0.05 % ophthalmic emulsion 1 drop  1 drop Both Eyes BID Bensimhon, Shaune Pascal, MD   1 drop at 02/16/22 0844   docusate sodium (COLACE) capsule 100 mg  100 mg Oral BID Bensimhon, Shaune Pascal, MD   100 mg at 02/16/22 0839   DULoxetine (CYMBALTA) DR capsule 60 mg  60 mg Oral Daily Bensimhon, Shaune Pascal, MD   60 mg at 02/16/22 0839   fluticasone (FLONASE) 50 MCG/ACT nasal spray 2 spray  2 spray Each Nare Daily Bensimhon, Shaune Pascal, MD   2 spray at 02/16/22 0844   furosemide (LASIX) 160 mg in dextrose 5 % 50 mL IVPB  160 mg Intravenous BID Bensimhon, Shaune Pascal, MD 66 mL/hr at 02/15/22 1749 160 mg at 02/15/22 1749   insulin aspart (novoLOG) injection 0-9 Units  0-9 Units Subcutaneous TID WC Bensimhon, Shaune Pascal, MD   2 Units at 02/15/22 1746   loratadine (CLARITIN) tablet 10 mg  10 mg Oral Daily Bensimhon, Shaune Pascal, MD   10 mg at 02/16/22 5852   metoprolol succinate (TOPROL-XL) 24 hr tablet 25 mg  25 mg Oral Daily Bensimhon, Shaune Pascal, MD   25 mg at 02/16/22 7782   multivitamin with minerals tablet   Oral Daily Bensimhon, Shaune Pascal, MD   1 tablet at 02/16/22 0839   nitroGLYCERIN (NITROSTAT) SL tablet 0.4 mg  0.4 mg Sublingual Q5 Min x 3 PRN Bensimhon, Shaune Pascal, MD       ondansetron [facility]) injection 4 mg  4 mg Intravenous Q6H PRN Bensimhon, Shaune Pascal, MD       oxyCODONE (Oxy IR/ROXICODONE) immediate release tablet 5 mg  5 mg Oral Q6H PRN Bensimhon, Shaune Pascal, MD   5 mg at 02/15/22 2055   pantoprazole (PROTONIX) EC tablet 40 mg  40 mg Oral Daily Bensimhon, Shaune Pascal, MD   40 mg at 02/16/22 0839   polyethylene glycol (MIRALAX / GLYCOLAX) packet 17 g  17 g Oral Daily Joette Catching, [place]   17 g at 02/16/22 4235   potassium chloride SA (KLOR-CON M) CR tablet 60 mEq  60 mEq Oral Daily  Bensimhon, Shaune Pascal, MD   60 mEq at 02/16/22 0840   potassium chloride SA (KLOR-CON M) CR tablet 60 mEq  60 mEq Oral Once Joette Catching, PA-C       rosuvastatin (CRESTOR) tablet 10 mg  10 mg Oral QPM Bensimhon, Shaune Pascal, MD   10 mg at 02/15/22 1743   senna-docusate (Senokot-S) tablet 1 tablet  1 tablet Oral BID Joette Catching, PA-C   1 tablet at 02/16/22 3614   sodium chloride flush (NS) 0.9 % injection 3 mL  3 mL Intravenous Q12H Bensimhon, Shaune Pascal, MD   3 mL at 02/15/22 2200   sodium chloride flush (NS) 0.9 % injection 3 mL  3 mL Intravenous PRN Bensimhon, Shaune Pascal, MD   3 mL at 02/12/22 2106   sodium chloride flush (NS) 0.9 % injection 3 mL  3 mL Intravenous Q12H Bensimhon, Shaune Pascal, MD   3  mL at 02/15/22 2054   sodium chloride flush (NS) 0.9 % injection 3 mL  3 mL Intravenous Q12H Bensimhon, Shaune Pascal, MD   3 mL at 02/16/22 0847   sodium chloride flush (NS) 0.9 % injection 3 mL  3 mL Intravenous PRN Bensimhon, Shaune Pascal, MD         ALLERGIES Bee pollen and Sulfa antibiotics  MEDICAL HISTORY Past Medical History:  Diagnosis Date   Acute on chronic systolic CHF (congestive heart failure) (HCC) 12/21/2020   Allergic rhinitis    Arthritis    Asthma    Chronic diastolic CHF (congestive heart failure) (HCC)    COPD (chronic obstructive pulmonary disease) (HCC)    Depression    DM (diabetes mellitus) ([place])    DVT (deep vein thrombosis) in pregnancy    GERD (gastroesophageal reflux disease)    HTN (hypertension)    Hyperlipidemia    Obesity    Pneumonia 04/2018   RIGHT LOBE   Sleep apnea    compliant with CPAP     SOCIAL HISTORY Social History   Socioeconomic History   Marital status: Significant Other    Spouse name: Not on file   Number of children: 2   Years of education: Not on file   Highest education level: Some college, no degree  Occupational History   Not on file  Tobacco Use   Smoking status: Never   Smokeless tobacco: Never  Vaping Use   Vaping  Use: Never used  Substance and Sexual Activity   Alcohol use: No   Drug use: No   Sexual activity: Not Currently  Other Topics Concern   Not on file  Social History Narrative   [place] lives in [place] with spouse.  2 grown children.   Previously worked in [employer] at [employer].  Now on disability   Social Determinants of Health   Financial Resource Strain: Low Risk  (01/19/2022)   Overall Financial Resource Strain (CARDIA)    Difficulty of Paying Living Expenses: Not hard at all  Food Insecurity: No Food Insecurity (01/19/2022)   Hunger Vital Sign    Worried About Running Out of Food in the Last Year: Never true    Ran Out of Food in the Last Year: Never true  Transportation Needs: No Transportation Needs (01/19/2022)   PRAPARE - Hydrologist (Medical): No    Lack of Transportation (Non-Medical): No  Physical Activity: Insufficiently Active (10/28/2021)   Exercise Vital Sign    Days of Exercise per Week: 2 days    Minutes of Exercise per Session: 70 min  Stress: No Stress Concern Present (10/28/2021)   [place]    Feeling of Stress : Not at all  Social Connections: Socially Isolated (10/28/2021)   Social Connection and Isolation Panel [NHANES]    Frequency of Communication with Friends and Family: More than three times a week    Frequency of Social Gatherings with Friends and Family: Three times a week    Attends Religious Services: Never    Active Member of Clubs or Organizations: No    Attends Archivist Meetings: Never    Marital Status: Widowed  Intimate Partner Violence: Not At Risk (10/28/2021)   Humiliation, Afraid, Rape, and Kick questionnaire    Fear of Current or Ex-Partner: No    Emotionally Abused: No    Physically Abused: No    Sexually Abused: No  FAMILY HISTORY Family History  Problem Relation Age of Onset   Breast cancer Mother    Cancer Mother     Hypertension Mother    Cancer Father    Hypertension Sister    Hypertension Brother    CAD Other       Review of Systems: 12 systems reviewed Otherwise as per HPI, all other systems reviewed and negative  Physical Exam: Vitals:   02/15/22 1916 02/16/22 0357  BP: 121/78 121/71  Pulse: 85 85  Resp: 20 19  Temp: 98.3 F (36.8 C) 98.1 F (36.7 C)  SpO2: 96% 95%   Total I/O In: 123 [P.O.:120; I.V.:3] Out: 400 [Urine:400]  Intake/Output Summary (Last 24 hours) at 02/16/2022 1019 Last data filed at 02/16/2022 0847 Gross per 24 hour  Intake 405 ml  Output 4450 ml  Net -4045 ml   General: well-appearing, no acute distress HEENT: anicteric sclera, oropharynx clear without lesions CV: Normal rate, 2+ pitting edema in the bilateral lower extremities Lungs: Bilateral chest rise, normal work of breathing Abd: soft, non-tender, non-distended Skin: no visible lesions or rashes Psych: alert, engaged, appropriate mood and affect Musculoskeletal: no obvious deformities Neuro: normal speech, no gross focal deficits   Test Results Reviewed Lab Results  Component Value Date   NA 139 02/16/2022   K 3.8 02/16/2022   CL 103 02/16/2022   CO2 27 02/16/2022   BUN 33 (H) 02/16/2022   CREATININE 2.90 (H) 02/16/2022   CALCIUM 9.0 02/16/2022   ALBUMIN 3.3 (L) 02/06/2022   PHOS 3.4 11/22/2021    CBC Recent Labs  Lab 02/12/22 1226 02/14/22 1303 02/14/22 1304  WBC 5.3  --   --   HGB 8.1* 9.5* 9.5*  HCT 27.3* 28.0* 28.0*  MCV 84.8  --   --   PLT 202  --   --     I have reviewed all relevant outside healthcare records related to the patient's current hospitalization

## 2022-02-16 NOTE — Progress Notes (Addendum)
Advanced Heart Failure Rounding Note  PCP-Cardiologist: Buford Dresser, MD   Subjective:    RHC 06/27 Markedly elevated biventricular filling pressures, PA sat low but fick CO preserved Findings:   RA = 27  RV = 62/25 PA = 60/27 (41) PCW = 25 (v = 35) Fick cardiac output/index = 6.8/2.8 PVR = 2.4 WU Ao sat = 90% PA sat = 45%, 49% PAPi = 1.2  4.8L in UOP charted yesterday with IV lasix 160 BID + 2.5 mg metolazone. Weight down 5 lb.  Scr 2.25>>2.40>>2.84>>2.80>>2.90  K 3.8  Maintaining SR.  Feeling better today. Leg edema and dyspnea improving. Still feels like she has some fluid on board.    Objective:   Weight Range: 135.4 kg Body mass index is 46.75 kg/m.   Vital Signs:   Temp:  [97.5 F (36.4 C)-98.3 F (36.8 C)] 98.1 F (36.7 C) (06/29 0357) Pulse Rate:  [83-85] 85 (06/29 0357) Resp:  [19-20] 19 (06/29 0357) BP: (119-125)/(60-78) 121/71 (06/29 0357) SpO2:  [95 %-97 %] 95 % (06/29 0357) Weight:  [135.4 kg] 135.4 kg (06/29 0357) Last BM Date : 02/14/22  Weight change: Filed Weights   02/14/22 0438 02/15/22 0338 02/16/22 0357  Weight: (!) 137.8 kg (!) 137.7 kg 135.4 kg    Intake/Output:   Intake/Output Summary (Last 24 hours) at 02/16/2022 0800 Last data filed at 02/16/2022 0751 Gross per 24 hour  Intake 402 ml  Output 4450 ml  Net -4048 ml      Physical Exam    General:  Sitting up in bed. No distress. HEENT: normal Neck: supple. JVP to ear. Carotids 2+ bilat; no bruits.  Cor: PMI nondisplaced. Regular rate & rhythm. No rubs, gallops, 2/6 TR murmur. Lungs: clear Abdomen: obese, soft, nontender, + distended.  Extremities: no cyanosis, clubbing, rash, + UNNA Neuro: alert & orientedx3, cranial nerves grossly intact. moves all 4 extremities w/o difficulty. Affect pleasant    Telemetry   SR 80s, 10 PVCs/min  EKG    N/A   Labs    CBC Recent Labs    02/14/22 1303 02/14/22 1304  HGB 9.5* 9.5*  HCT 28.0* 28.0*   Basic  Metabolic Panel Recent Labs    02/15/22 0326 02/16/22 0618  NA 138 139  K 3.4* 3.8  CL 101 103  CO2 27 27  GLUCOSE 128* 104*  BUN 27* 33*  CREATININE 2.80* 2.90*  CALCIUM 8.8* 9.0   Liver Function Tests No results for input(s): "AST", "ALT", "ALKPHOS", "BILITOT", "PROT", "ALBUMIN" in the last 72 hours. No results for input(s): "LIPASE", "AMYLASE" in the last 72 hours. Cardiac Enzymes No results for input(s): "CKTOTAL", "CKMB", "CKMBINDEX", "TROPONINI" in the last 72 hours.  BNP: BNP (last 3 results) Recent Labs    01/26/22 1156 02/06/22 1809 02/12/22 1226  BNP 375.6* 550.8* 517.3*    ProBNP (last 3 results) No results for input(s): "PROBNP" in the last 8760 hours.   D-Dimer No results for input(s): "DDIMER" in the last 72 hours. Hemoglobin A1C No results for input(s): "HGBA1C" in the last 72 hours. Fasting Lipid Panel No results for input(s): "CHOL", "HDL", "LDLCALC", "TRIG", "CHOLHDL", "LDLDIRECT" in the last 72 hours. Thyroid Function Tests No results for input(s): "TSH", "T4TOTAL", "T3FREE", "THYROIDAB" in the last 72 hours.  Invalid input(s): "FREET3"  Other results:   Imaging    No results found.   Medications:     Scheduled Medications:  amiodarone  200 mg Oral Daily   apixaban  5 mg  Oral BID   busPIRone  5 mg Oral BID   colchicine  0.3 mg Oral Daily   cycloSPORINE  1 drop Both Eyes BID   docusate sodium  100 mg Oral BID   DULoxetine  60 mg Oral Daily   fluticasone  2 spray Each Nare Daily   insulin aspart  0-9 Units Subcutaneous TID WC   loratadine  10 mg Oral Daily   metoprolol succinate  25 mg Oral Daily   multivitamin with minerals   Oral Daily   pantoprazole  40 mg Oral Daily   polyethylene glycol  17 g Oral Daily   potassium chloride SA  60 mEq Oral Daily   rosuvastatin  10 mg Oral QPM   senna-docusate  1 tablet Oral BID   sodium chloride flush  3 mL Intravenous Q12H   sodium chloride flush  3 mL Intravenous Q12H   sodium  chloride flush  3 mL Intravenous Q12H    Infusions:  sodium chloride     sodium chloride     furosemide 160 mg (02/15/22 1749)    PRN Medications: sodium chloride, sodium chloride, acetaminophen, albuterol, nitroGLYCERIN, ondansetron (ZOFRAN) IV, oxyCODONE, sodium chloride flush, sodium chloride flush    Patient Profile   66 y.o. female with history of chronic biventricular systolic CHF, PAF/AFL, CKD IV, CAD, valvular heart disease, DMII, OSA. Multiple admissions for decompensated HF mostly in setting of recurrent AF.    Now presenting with a/c CHF.   Assessment/Plan     1. Acute on chronic Biventricular Systolic Heart Failure: - RHC (6/22) with low filling pressures with moderately reduced CO. - Echo (7/22) in setting of AFL with RVR, showed EF 30-35%, moderate LVH, moderate RV dysfunction, PASP 48, s/p MV repair with mild MR and mean gradient 10 mmHg with HR around 130, s/p TV repair with mild-moderate TR, dilated IVC.   - RHC 4/23 markedly elevated biventricular pressures - Echo 4/23 35-40% RV moderately down severe TR. Moderate MS mean grad 32m HG - TEE 06/02: Severe biventricular dysfunction, trivial MR, mean gradient 3 across MV prosthesis, severe TR - Multiple admissions recently for HF mostly occur in setting of recurrent AF. Currently in NSR. - RHC 06/27 markedly elevated biventricular pressures, low PAPi, PA sats low but preserved Fick CO. - NYHA IIIb/IV. Brisk diuresis yesterday with IV lasix 160 BID + 2.5 mg metolazone. Continue today.  - In and out of AFL at recent visits. Maintaining SR this admit. - UNNA boots - GDMT limited by CKD.  - No Bidil for now.  Avoid hypotension w/ CKD  - Continue toprol xl 25 mg daily  - Worry may have end-stage disease. At some point, HD may be only way to manage volume status if renal function worsens with attempts to diurese.    2. PAF/AFL - She has failed Tikosyn. - She had Maze in 5/22 and DCCV in 6/22 & 04/19/21, last of which  she had ERAF. - Multiple admissions recently for HF mostly occur in setting of recurrent AF/AFL. Last DCCV 06/13.  - Currently maintaining SR - Continue amiodarone + Eliquis  - Unable to tolerate AF at all. Follows with EP. Not a candidate for Afib ablation or AV nodal ablation/PPM - Has OSA, awaiting split night study for CPAP titration.  - Keep K > 4.0 and Mg > 2.0    3. CKD Stage IV   - New baseline creatinine  ~ 2.6 - 2.8. Stabilized around 3.1 during recent admit - Scr 2.3  on admit, 2.4>>2.8>>2.8>>2.9 - Follow with diuresis - No SGLT2i, GFR has been consistently less than 20 - Follows with Nephrology as outpatient   4. CAD - Cath (2019): Mild nonobstructive CAD - No chest pain.  - Continue statin + beta blocker - no ASA w/ Eliquis    5. Valvular Heart Disease: - S/p MV and TV repair in 5/22.  - Echo 02/25/21 with mild-moderate TR.   - Echo 4/23 35-40% RV moderately down. severe TR. Moderate MS mean gradient 87mHG  - TEE 06/23: severe biventricular dysfunction, trivial MR, mean gradient 3 across MV prosthesis, severe TR - Seen by structural heart team. Not a candidate for valve in valve tricuspid replacement d/t severe left heart failure   6. Obesity - Body mass index is 48 - Discussed portion control.    7. DMII - Hgb A1c 6.7% 04/23 - GFR has been too low for SGLT2i   8. OSA - initial sleep study positive but was awaiting split night study to titrate CPAP - Has been referred to Dr. TRadford Pax- suspect this is part of her AF recurrence   9. PVCs - About 10/min on tele this am. - Continue PO amiodarone  - Keep K > 4 and Mag > 2  10. Bacteriuria - Urine culture grew E.Coli and Enterobacter cloacae - Not currently treated d/t lack of symptoms   Continue to mobilize.  PT/OT.   Length of Stay: 4  [facility], [place] PA-C  02/16/2022, 8:00 AM  Advanced Heart Failure Team Pager [phone](M-F; 7a - 5p)  Please contact [facility] for night-coverage after hours (5p  -7a ) and weekends on [email or web address]  Patient seen and examined with the above-signed Advanced Practice Provider and/or Housestaff. I personally reviewed laboratory data, imaging studies and relevant notes. I independently examined the patient and formulated the important aspects of the plan. I have edited the note to reflect any of my changes or salient points. I have personally discussed the plan with the patient and/or family.  Diuresing on high-dose lasix and metolazone. Scr up slightly. Sill SOB with mild exertion.  General:  Sitting up in bed No resp difficulty HEENT: normal Neck: supple. JVP to ear  Carotids 2+ bilat; no bruits. No lymphadenopathy or thryomegaly appreciated. Cor: PMI nondisplaced. Regular rate & rhythm. 3/6 TR Lungs: clear Abdomen: obese soft, nontender, nondistended. No hepatosplenomegaly. No bruits or masses. Good bowel sounds. Extremities: no cyanosis, clubbing, rash, edema Neuro: alert & orientedx3, cranial nerves grossly intact. moves all 4 extremities w/o difficulty. Affect pleasant  Remains volume overloaded. Continue aggressive diuresis. D/w Renal will push diuresis as much as possible until we get her dry (regardless of what renal function dose). Likely worth repeat RHC when we think she is fully diuresed.   DGlori Bickers MD  5:06 PM

## 2022-02-16 NOTE — Progress Notes (Signed)
Physical Therapy Treatment Patient Details Name: [name] MRN: [id number] DOB: 16-Nov-1955 Today's Date: 02/16/2022   History of Present Illness Pt is a 66 yo femaled presenting with SOB and increased LE edema. Admitted 02/12/2022 for CHF exacerbation. Recent hospitalization from 5/30-6/5 for CHF exacerbation. Previous trip to ED for similar symptoms on 6/20, with fall returning home. 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    PT Comments    Pt tolerates therapy well today, progressing to a [facility] which she uses at baseline. Pt ambulates household distances with one standing rest break. Continued PT services should facilitate improvements in Pt's activity tolerance and strength. Pt would benefit from stair training next session.  Recommendations for follow up therapy are one component of a multi-disciplinary discharge planning process, led by the attending physician.  Recommendations may be updated based on patient status, additional functional criteria and insurance authorization.  Follow Up Recommendations  Outpatient PT     Assistance Recommended at Discharge PRN  Patient can return home with the following A little help with walking and/or transfers;A little help with bathing/dressing/bathroom;Assistance with cooking/housework;Assist for transportation;Help with stairs or ramp for entrance   Equipment Recommendations  None recommended by PT    Recommendations for Other Services       Precautions / Restrictions Precautions Precautions: Fall Restrictions Weight Bearing Restrictions: No     Mobility  Bed Mobility Overal bed mobility: Modified Independent Bed Mobility: Supine to Sit     Supine to sit: Modified independent (Device/Increase time)     General bed mobility comments: HOB elevated during bed mobility    Transfers Overall transfer  level: Modified independent Equipment used: Straight cane Transfers: Sit to/from Stand Sit to Stand: Modified independent (Device/Increase time)                Ambulation/Gait Ambulation/Gait assistance: Min guard Gait Distance (Feet): 80 Feet (Additional 80' back to chair after standing rest) Assistive device: Straight cane Gait Pattern/deviations: Step-through pattern, Decreased step length - right, Decreased step length - left, Decreased stride length Gait velocity: decreased Gait velocity interpretation: <1.8 ft/sec, indicate of risk for recurrent falls   General Gait Details: Pt reports that ambulation today feels close to her baseline   Stairs             Wheelchair Mobility    Modified Rankin (Stroke Patients Only)       Balance Overall balance assessment: Modified Independent Sitting-balance support: Feet supported, No upper extremity supported Sitting balance-Leahy Scale: Good     Standing balance support: Single extremity supported, During functional activity, Reliant on assistive device for balance Standing balance-Leahy Scale: Poor                              Cognition Arousal/Alertness: Awake/alert Behavior During Therapy: WFL for tasks assessed/performed Overall Cognitive Status: Within Functional Limits for tasks assessed                                          Exercises      General Comments General comments (skin integrity, edema, etc.): Would like to see Pt perform stairs      Pertinent Vitals/Pain Pain Assessment Pain Assessment: 0-10 Pain Score: 8  Pain Location: LE Pain  Descriptors / Indicators: Aching Pain Intervention(s): Monitored during session    Home Living                          Prior Function            PT Goals (current goals can now be found in the care plan section) Acute Rehab PT Goals Patient Stated Goal: to go home PT Goal Formulation: With patient Time For  Goal Achievement: 02/27/22 Potential to Achieve Goals: Good Progress towards PT goals: Progressing toward goals    Frequency    Min 2X/week      PT Plan Frequency needs to be updated    Co-evaluation              AM-PAC PT "6 Clicks" Mobility   Outcome Measure  Help needed turning from your back to your side while in a flat bed without using bedrails?: A Little Help needed moving from lying on your back to sitting on the side of a flat bed without using bedrails?: A Little Help needed moving to and from a bed to a chair (including a wheelchair)?: None Help needed standing up from a chair using your arms (e.g., wheelchair or bedside chair)?: None Help needed to walk in hospital room?: A Little Help needed climbing 3-5 steps with a railing? : A Little 6 Click Score: 20    End of Session Equipment Utilized During Treatment: Gait belt Activity Tolerance: Patient tolerated treatment well Patient left: in chair;with call bell/phone within reach Nurse Communication: Mobility status PT Visit Diagnosis: Other abnormalities of gait and mobility (R26.89);Muscle weakness (generalized) (M62.81)     Time: 2878-6767 PT Time Calculation (min) (ACUTE ONLY): 18 min  Charges:                        Hall Busing, SPT Acute Rehabilitation Office #: [phone]    Hall Busing 02/16/2022, 5:10 PM

## 2022-02-16 NOTE — Progress Notes (Signed)
Mobility Specialist Progress Note    02/16/22 1011  Mobility  Activity Ambulated with assistance in hallway  Level of Assistance Contact guard assist, steadying assist  Assistive Device Other (Comment) (hallway railings)  Distance Ambulated (ft) 170 ft (85+85)  Activity Response Tolerated fair  $Mobility charge 1 Mobility   Pre-Mobility: 87 HR During Mobility: 93 HR Post-Mobility: 82 HR  Pt received in bed and agreeable. No complaints. Pt SOB with exertion and took x1 seated rest break. Returned to chair with call bell in reach.    Hildred Alamin Mobility Specialist

## 2022-02-16 NOTE — Progress Notes (Signed)
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 Remains in the hospital with severe right-sided fluid overload, on IV diuretics.  Assessment & Plan:   Acute on chronic biventricular systolic heart failure, structural heart disease Echocardiogram 6/23 normal (Ciproxin, severe RV dysfunction, severe TR, status post MVR Right heart catheterization 6/27 with greatly elevated biventricular pressures Currently attempting diuresis on Lasix 160 mg twice daily, metolazone along with potassium replacements. Followed by advanced heart failure team. She remains on metoprolol. Urine output is nicely more than 4 L / 24 hours.  Some clinical improvement but overall remains in difficult fluid balance status.  Paroxysmal A-fib: Failed Tikosyn.  Maze procedure on 5/22.  Multiple cardioversions.  Currently in sinus rhythm on amiodarone, therapeutic on Eliquis. Electrolytes adequate. Does have history of sleep apnea, does not have device at home.  She is in the process of getting a new sleep study.  Coronary artery disease: Continue on a statin, beta-blockers.  She is on Eliquis.  Type 2 diabetes, well controlled.  Trulicity at home.  Currently on insulin.  Obesity class III, BMI 48.  She will benefit with outpatient follow-up.  Depression/anxiety: On Cymbalta and BuSpar.  Continue.  CKD stage IV: Remains at about baseline.  Difficult fluid balance status.  Nephrology has started seeing.  Asymptomatic bacteriuria: Urine culture from 6/26 with E. coli.  Patient without  obvious UTI symptoms.  Will not treat.    DVT prophylaxis:  apixaban (ELIQUIS) tablet 5 mg   Code Status: Full code Family Communication: None Disposition Plan: Status is: Inpatient Remains inpatient appropriate because: Significant fluid overload on advanced heart failure therapy     Consultants:  Heart failure team  Procedures:  Right heart cath  Antimicrobials:  None   Subjective: Seen and examined.  Walking in the hallway with mobility tech.  Feels exhausted but maintained oxygen saturation.  Legs are edematous.  Some improvement of shortness of breath.  Objective: Vitals:   02/15/22 1700 02/15/22 1916 02/16/22 0357 02/16/22 1018  BP: 119/60 121/78 121/71 (!) 100/59  Pulse: 83 85 85 86  Resp: '20 20 19 20  '$ Temp: 97.7 F (36.5 C) 98.3 F (36.8 C) 98.1 F (36.7 C) 97.7 F (36.5 C)  TempSrc: Oral Oral Oral Oral  SpO2: 97% 96% 95% 99%  Weight:   135.4 kg   Height:        Intake/Output Summary (Last 24 hours) at 02/16/2022 1034 Last data filed at 02/16/2022 1022 Gross per 24 hour  Intake 405 ml  Output 4800 ml  Net -4395 ml   Filed Weights   02/14/22 0438 02/15/22 0338 02/16/22 0357  Weight: (!) 137.8 kg (!) 137.7 kg 135.4 kg    Examination:  General exam: Appears calm and comfortable.  Walking in the hallway on room air. Respiratory system: No added sounds. Cardiovascular system: S1 & S2 heard, RRR.  Edematous and obese legs, now has Haematologist. Gastrointestinal system: Obese and pendulous.  Nontender. Central nervous system: Alert and oriented. No focal neurological deficits. Extremities: Symmetric 5 x 5 power.   Data Reviewed: I have personally reviewed following labs and  imaging studies  CBC: Recent Labs  Lab 02/12/22 1226 02/14/22 1303 02/14/22 1304  WBC 5.3  --   --   HGB 8.1* 9.5* 9.5*  HCT 27.3* 28.0* 28.0*  MCV 84.8  --   --   PLT 202  --   --    Basic Metabolic Panel: Recent Labs  Lab 02/12/22 1226 02/13/22 0325 02/14/22 0218  02/14/22 1303 02/14/22 1304 02/15/22 0326 02/16/22 0618  NA 141 139 136 140 141 138 139  K 3.9 3.6 3.7 3.7 3.5 3.4* 3.8  CL 105 108 102  --   --  101 103  CO2 '25 25 25  '$ --   --  27 27  GLUCOSE 102* 100* 124*  --   --  128* 104*  BUN 21 20 24*  --   --  27* 33*  CREATININE 2.25* 2.40* 2.84*  --   --  2.80* 2.90*  CALCIUM 9.6 8.6* 8.8*  --   --  8.8* 9.0  MG 1.8 1.9  --   --   --   --  1.9   GFR: Estimated Creatinine Clearance: 27.8 mL/min (A) (by C-G formula based on SCr of 2.9 mg/dL (H)). Liver Function Tests: No results for input(s): "AST", "ALT", "ALKPHOS", "BILITOT", "PROT", "ALBUMIN" in the last 168 hours. No results for input(s): "LIPASE", "AMYLASE" in the last 168 hours. No results for input(s): "AMMONIA" in the last 168 hours. Coagulation Profile: No results for input(s): "INR", "PROTIME" in the last 168 hours. Cardiac Enzymes: No results for input(s): "CKTOTAL", "CKMB", "CKMBINDEX", "TROPONINI" in the last 168 hours. BNP (last 3 results) No results for input(s): "PROBNP" in the last 8760 hours. HbA1C: No results for input(s): "HGBA1C" in the last 72 hours. CBG: Recent Labs  Lab 02/15/22 0603 02/15/22 1109 02/15/22 1550 02/15/22 2118 02/16/22 0604  GLUCAP 117* 130* 155* 151* 119*   Lipid Profile: No results for input(s): "CHOL", "HDL", "LDLCALC", "TRIG", "CHOLHDL", "LDLDIRECT" in the last 72 hours. Thyroid Function Tests: No results for input(s): "TSH", "T4TOTAL", "FREET4", "T3FREE", "THYROIDAB" in the last 72 hours. Anemia Panel: No results for input(s): "VITAMINB12", "FOLATE", "FERRITIN", "TIBC", "IRON", "RETICCTPCT" in the last 72 hours. Sepsis Labs: No results for input(s): "PROCALCITON", "LATICACIDVEN" in the last 168 hours.  Recent Results (from the past 240 hour(s))  SARS Coronavirus 2 by RT PCR (hospital order, performed in [facility] hospital lab) *cepheid single result test* Anterior Nasal Swab     Status: None   Collection Time: 02/07/22  4:25 AM    Specimen: Anterior Nasal Swab  Result Value Ref Range Status   SARS Coronavirus 2 by RT PCR NEGATIVE NEGATIVE Final    Comment: (NOTE) SARS-CoV-2 target nucleic acids are NOT DETECTED.  The SARS-CoV-2 RNA is generally detectable in upper and lower respiratory specimens during the acute phase of infection. The lowest concentration of SARS-CoV-2 viral copies this assay can detect is 250 copies / mL. A negative result does not preclude SARS-CoV-2 infection and should not be used as the sole basis for treatment or other patient management decisions.  A negative result may occur with improper specimen collection / handling, submission of specimen other than nasopharyngeal swab, presence of viral mutation(s) within the areas targeted by this assay, and inadequate number of viral copies (<250 copies / mL). A negative result must be combined with clinical observations, patient history, and epidemiological information.  Fact Sheet for Patients:   https://www.patel.info/  Fact Sheet for Healthcare Providers: https://hall.com/  This test is not  yet approved or  cleared by the [place] and has been authorized for detection and/or diagnosis of SARS-CoV-2 by FDA under an Emergency Use Authorization (EUA).  This EUA will remain in effect (meaning this test can be used) for the duration of the COVID-19 declaration under Section 564(b)(1) of the Act, 21 U.S.C. section 360bbb-3(b)(1), unless the authorization is terminated or revoked sooner.  Performed at [facility], [place] [address], [place], [place] [address]   SARS Coronavirus 2 by RT PCR (hospital order, performed in [facility] hospital lab) *cepheid single result test* Anterior Nasal Swab     Status: None   Collection Time: 02/12/22 12:26 PM   Specimen: Anterior Nasal Swab  Result Value Ref Range Status   SARS Coronavirus 2 by RT PCR NEGATIVE NEGATIVE Final    Comment:  (NOTE) SARS-CoV-2 target nucleic acids are NOT DETECTED.  The SARS-CoV-2 RNA is generally detectable in upper and lower respiratory specimens during the acute phase of infection. The lowest concentration of SARS-CoV-2 viral copies this assay can detect is 250 copies / mL. A negative result does not preclude SARS-CoV-2 infection and should not be used as the sole basis for treatment or other patient management decisions.  A negative result may occur with improper specimen collection / handling, submission of specimen other than nasopharyngeal swab, presence of viral mutation(s) within the areas targeted by this assay, and inadequate number of viral copies (<250 copies / mL). A negative result must be combined with clinical observations, patient history, and epidemiological information.  Fact Sheet for Patients:   https://www.patel.info/  Fact Sheet for Healthcare Providers: https://hall.com/  This test is not yet approved or  cleared by the [place] FDA and has been authorized for detection and/or diagnosis of SARS-CoV-2 by FDA under an Emergency Use Authorization (EUA).  This EUA will remain in effect (meaning this test can be used) for the duration of the COVID-19 declaration under Section 564(b)(1) of the Act, 21 U.S.C. section 360bbb-3(b)(1), unless the authorization is terminated or revoked sooner.  Performed at [facility], [address], [place], [place] [address]   Urine Culture     Status: Abnormal   Collection Time: 02/13/22  8:09 AM   Specimen: Urine, Clean Catch  Result Value Ref Range Status   Specimen Description URINE, CLEAN CATCH  Final   Special Requests   Final    NONE Performed at [facility], [place] [address] [place], [place] [address]    Culture (A)  Final    >=100,000 COLONIES/mL ESCHERICHIA COLI 60,000 COLONIES/mL ENTEROBACTER CLOACAE    Report Status 02/15/2022 FINAL  Final    Organism ID, Bacteria ESCHERICHIA COLI (A)  Final   Organism ID, Bacteria ENTEROBACTER CLOACAE (A)  Final      Susceptibility   Enterobacter cloacae - MIC*    CEFAZOLIN >=64 RESISTANT Resistant     CEFEPIME 2 SENSITIVE Sensitive     CIPROFLOXACIN <=0.25 SENSITIVE Sensitive     GENTAMICIN <=1 SENSITIVE Sensitive     IMIPENEM 0.5 SENSITIVE Sensitive     NITROFURANTOIN 32 SENSITIVE Sensitive     TRIMETH/SULFA <=20 SENSITIVE Sensitive     PIP/TAZO >=128 RESISTANT Resistant     * 60,000 COLONIES/mL ENTEROBACTER CLOACAE   Escherichia coli - MIC*    AMPICILLIN 4 SENSITIVE Sensitive     CEFAZOLIN <=4 SENSITIVE Sensitive     CEFEPIME <=0.12 SENSITIVE Sensitive     CEFTRIAXONE <=0.25 SENSITIVE Sensitive  CIPROFLOXACIN <=0.25 SENSITIVE Sensitive     GENTAMICIN <=1 SENSITIVE Sensitive     IMIPENEM <=0.25 SENSITIVE Sensitive     NITROFURANTOIN 32 SENSITIVE Sensitive     TRIMETH/SULFA <=20 SENSITIVE Sensitive     AMPICILLIN/SULBACTAM <=2 SENSITIVE Sensitive     PIP/TAZO <=4 SENSITIVE Sensitive     * >=100,000 COLONIES/mL ESCHERICHIA COLI         Radiology Studies: CARDIAC CATHETERIZATION  Result Date: 02/14/2022 Findings: RA = 27 RV = 62/25 PA = 60/27 (41) PCW = 25 (v = 35) Fick cardiac output/index = 6.8/2.8 PVR = 2.4 WU Ao sat = 90% PA sat = 45%, 49% PAPi = 1.2 Assessment: 1. Markedly elevated biventricular pressures 2. Low PAPI suggestive of RV dysfunction 3. PA sats are low but Fick CO maintained Plan/Discussion: Diurese further as renal function tolerates. Glori Bickers, MD 2:29 PM       Scheduled Meds:  amiodarone  200 mg Oral Daily   apixaban  5 mg Oral BID   busPIRone  5 mg Oral BID   colchicine  0.3 mg Oral Daily   cycloSPORINE  1 drop Both Eyes BID   docusate sodium  100 mg Oral BID   DULoxetine  60 mg Oral Daily   fluticasone  2 spray Each Nare Daily   insulin aspart  0-9 Units Subcutaneous TID WC   loratadine  10 mg Oral Daily   metoprolol succinate  25 mg  Oral Daily   multivitamin with minerals   Oral Daily   pantoprazole  40 mg Oral Daily   polyethylene glycol  17 g Oral Daily   potassium chloride SA  60 mEq Oral Daily   potassium chloride  60 mEq Oral Once   rosuvastatin  10 mg Oral QPM   senna-docusate  1 tablet Oral BID   sodium chloride flush  3 mL Intravenous Q12H   sodium chloride flush  3 mL Intravenous Q12H   sodium chloride flush  3 mL Intravenous Q12H   Continuous Infusions:  sodium chloride     sodium chloride     furosemide 160 mg (02/16/22 1032)     LOS: 4 days    Time spent: 35 minutes    Barb Merino, MD [facility] Pager [phone]

## 2022-02-17 ENCOUNTER — Other Ambulatory Visit (HOSPITAL_COMMUNITY): Payer: Self-pay | Admitting: Family Medicine

## 2022-02-17 DIAGNOSIS — I50813 Acute on chronic right heart failure: Secondary | ICD-10-CM | POA: Diagnosis not present

## 2022-02-17 LAB — GLUCOSE, CAPILLARY
Glucose-Capillary: 118 mg/dL — ABNORMAL HIGH (ref 70–99)
Glucose-Capillary: 124 mg/dL — ABNORMAL HIGH (ref 70–99)
Glucose-Capillary: 145 mg/dL — ABNORMAL HIGH (ref 70–99)
Glucose-Capillary: 133 mg/dL — ABNORMAL HIGH (ref 70–99)

## 2022-02-17 LAB — IRON AND TIBC
Iron: 24 ug/dL — ABNORMAL LOW (ref 28–170)
Saturation Ratios: 6 % — ABNORMAL LOW (ref 10.4–31.8)
TIBC: 382 ug/dL (ref 250–450)
UIBC: 358 ug/dL

## 2022-02-17 LAB — BASIC METABOLIC PANEL
Anion gap: 8 (ref 5–15)
BUN: 37 mg/dL — ABNORMAL HIGH (ref 8–23)
CO2: 30 mmol/L (ref 22–32)
Calcium: 9.3 mg/dL (ref 8.9–10.3)
Chloride: 100 mmol/L (ref 98–111)
Creatinine, Ser: 3.01 mg/dL — ABNORMAL HIGH (ref 0.44–1.00)
GFR, Estimated: 17 mL/min — ABNORMAL LOW (ref >60)
Glucose, Bld: 129 mg/dL — ABNORMAL HIGH (ref 70–99)
Potassium: 4 mmol/L (ref 3.5–5.1)
Sodium: 138 mmol/L (ref 135–145)

## 2022-02-17 LAB — MAGNESIUM: Magnesium: 2.1 mg/dL (ref 1.7–2.4)

## 2022-02-17 LAB — CBC
HCT: 28.1 % — ABNORMAL LOW (ref 36.0–46.0)
Hemoglobin: 8.4 g/dL — ABNORMAL LOW (ref 12.0–15.0)
MCH: 24.9 pg — ABNORMAL LOW (ref 26.0–34.0)
MCHC: 29.9 g/dL — ABNORMAL LOW (ref 30.0–36.0)
MCV: 83.1 fL (ref 80.0–100.0)
Platelets: 224 10*3/uL (ref 150–400)
RBC: 3.38 MIL/uL — ABNORMAL LOW (ref 3.87–5.11)
RDW: 15.9 % — ABNORMAL HIGH (ref 11.5–15.5)
WBC: 5 10*3/uL (ref 4.0–10.5)
nRBC: 0 % (ref 0.0–0.2)

## 2022-02-17 LAB — FERRITIN: Ferritin: 38 ng/mL (ref 11–307)

## 2022-02-17 MED ORDER — SODIUM CHLORIDE 0.9 % IV SOLN
250.0000 mg | Freq: Every day | INTRAVENOUS | Status: DC
  Administered 2022-02-17: 250 mg via INTRAVENOUS
  Filled 2022-02-17 (×2): qty 20

## 2022-02-17 NOTE — Progress Notes (Signed)
Mobility Specialist Progress Note:   02/17/22 1524  Mobility  Activity Ambulated with assistance in hallway  Level of Assistance Standby assist, set-up cues, supervision of patient - no hands on  Assistive Device Front wheel walker  Distance Ambulated (ft) 230 ft  Activity Response Tolerated well  $Mobility charge 1 Mobility   Pt received in bed willing to participate in mobility. No complaints of pain. Left in chair with call bell in reach and all needs met.   [facility] Lucella Pommier Mobility Specialist

## 2022-02-17 NOTE — Progress Notes (Signed)
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 Remains in the hospital with severe right-sided fluid overload, on IV diuretics.  Assessment & Plan:   Acute on chronic biventricular systolic heart failure, structural heart disease Echocardiogram 6/23 normal (Ciproxin, severe RV dysfunction, severe TR, status post MVR Right heart catheterization 6/27 with greatly elevated biventricular pressures Currently attempting diuresis on Lasix 160 mg twice daily, metolazone along with potassium replacements. Followed by advanced heart failure team. She remains on metoprolol. Urine output is nicely more than 4 L / 24 hours.  Some clinical improvement but still grossly fluid overloaded.  Paroxysmal A-fib: Failed Tikosyn.  Maze procedure on 5/22.  Multiple cardioversions.  Currently in sinus rhythm on amiodarone, therapeutic on Eliquis. Electrolytes adequate. Does have history of sleep apnea, does not have device at home.  She is in the process of getting a new sleep study.  Coronary artery disease: Continue on a statin, beta-blockers.  She is on Eliquis.  Type 2 diabetes, well controlled.  Trulicity at home.  Currently on insulin.  Obesity class III, BMI 48.  She will benefit with outpatient follow-up.  Depression/anxiety: On Cymbalta and BuSpar.  Continue.  CKD stage IV: Remains at about baseline.  Difficult fluid balance status.  Nephrology following.  Remains stable.  Getting IV iron today.  Asymptomatic bacteriuria: Urine culture from 6/26 with E. coli.   Patient without obvious UTI symptoms.  Will not treat.    DVT prophylaxis:  apixaban (ELIQUIS) tablet 5 mg   Code Status: Full code Family Communication: None Disposition Plan: Status is: Inpatient Remains inpatient appropriate because: Significant fluid overload on advanced heart failure therapy     Consultants:  Heart failure team  Procedures:  Right heart cath  Antimicrobials:  None   Subjective:  Seen and examined.  No overnight events.  Able to walk around with some heavy legs.  Objective: Vitals:   02/16/22 1942 02/17/22 0028 02/17/22 0414 02/17/22 1035  BP: 113/61 113/61 122/71 (!) 110/52  Pulse: 85 88 85 83  Resp: 20 (!) '22 18 19  '$ Temp: 97.6 F (36.4 C)  97.7 F (36.5 C) 97.6 F (36.4 C)  TempSrc: Oral  Oral Oral  SpO2: 100% 99% 95% 97%  Weight:   (!) 136.4 kg   Height:        Intake/Output Summary (Last 24 hours) at 02/17/2022 1323 Last data filed at 02/17/2022 1029 Gross per 24 hour  Intake 333 ml  Output 4300 ml  Net -3967 ml   Filed Weights   02/14/22 0438 02/15/22 0338 02/17/22 0414  Weight: (!) 137.8 kg (!) 137.7 kg (!) 136.4 kg    Examination:  General exam: Appears calm and comfortable.  In the bed.  On room air. Respiratory system: No added sounds.  Difficult to auscultate. Cardiovascular system: S1 & S2 heard, RRR.  Edematous and obese legs, now has Haematologist. Gastrointestinal system: Obese and pendulous.  Nontender. Central nervous system: Alert and oriented. No focal neurological deficits. Extremities: Symmetric 5 x 5 power.   Data Reviewed: I have personally reviewed following labs and imaging studies  CBC: Recent  Labs  Lab 02/12/22 1226 02/14/22 1303 02/14/22 1304 02/17/22 0252  WBC 5.3  --   --  5.0  HGB 8.1* 9.5* 9.5* 8.4*  HCT 27.3* 28.0* 28.0* 28.1*  MCV 84.8  --   --  83.1  PLT 202  --   --  469   Basic Metabolic Panel: Recent Labs  Lab 02/12/22 1226 02/13/22 0325 02/14/22 0218 02/14/22 1303  02/14/22 1304 02/15/22 0326 02/16/22 0618 02/17/22 0252  NA 141 139 136 140 141 138 139 138  K 3.9 3.6 3.7 3.7 3.5 3.4* 3.8 4.0  CL 105 108 102  --   --  101 103 100  CO2 '25 25 25  '$ --   --  '27 27 30  '$ GLUCOSE 102* 100* 124*  --   --  128* 104* 129*  BUN 21 20 24*  --   --  27* 33* 37*  CREATININE 2.25* 2.40* 2.84*  --   --  2.80* 2.90* 3.01*  CALCIUM 9.6 8.6* 8.8*  --   --  8.8* 9.0 9.3  MG 1.8 1.9  --   --   --   --  1.9 2.1   GFR: Estimated Creatinine Clearance: 26.9 mL/min (A) (by C-G formula based on SCr of 3.01 mg/dL (H)). Liver Function Tests: No results for input(s): "AST", "ALT", "ALKPHOS", "BILITOT", "PROT", "ALBUMIN" in the last 168 hours. No results for input(s): "LIPASE", "AMYLASE" in the last 168 hours. No results for input(s): "AMMONIA" in the last 168 hours. Coagulation Profile: No results for input(s): "INR", "PROTIME" in the last 168 hours. Cardiac Enzymes: No results for input(s): "CKTOTAL", "CKMB", "CKMBINDEX", "TROPONINI" in the last 168 hours. BNP (last 3 results) No results for input(s): "PROBNP" in the last 8760 hours. HbA1C: No results for input(s): "HGBA1C" in the last 72 hours. CBG: Recent Labs  Lab 02/16/22 1054 02/16/22 1631 02/16/22 2124 02/17/22 0548 02/17/22 1037  GLUCAP 137* 125* 167* 118* 124*   Lipid Profile: No results for input(s): "CHOL", "HDL", "LDLCALC", "TRIG", "CHOLHDL", "LDLDIRECT" in the last 72 hours. Thyroid Function Tests: No results for input(s): "TSH", "T4TOTAL", "FREET4", "T3FREE", "THYROIDAB" in the last 72 hours. Anemia Panel: Recent Labs    02/17/22 0252  FERRITIN 38  TIBC 382  IRON 24*   Sepsis Labs: No results for input(s): "PROCALCITON", "LATICACIDVEN" in the last 168 hours.  Recent Results (from the past 240 hour(s))  SARS Coronavirus 2 by RT PCR (hospital order, performed in [facility] hospital lab) *cepheid single result test* Anterior Nasal Swab     Status: None   Collection Time: 02/12/22 12:26 PM    Specimen: Anterior Nasal Swab  Result Value Ref Range Status   SARS Coronavirus 2 by RT PCR NEGATIVE NEGATIVE Final    Comment: (NOTE) SARS-CoV-2 target nucleic acids are NOT DETECTED.  The SARS-CoV-2 RNA is generally detectable in upper and lower respiratory specimens during the acute phase of infection. The lowest concentration of SARS-CoV-2 viral copies this assay can detect is 250 copies / mL. A negative result does not preclude SARS-CoV-2 infection and should not be used as the sole basis for treatment or other patient management decisions.  A negative result may occur with improper specimen collection / handling, submission of specimen other than nasopharyngeal swab, presence of viral mutation(s) within the areas targeted by this assay, and inadequate number of viral copies (<250 copies / mL). A negative result must be combined with clinical observations, patient history, and epidemiological information.  Fact Sheet for Patients:  https://www.patel.info/  Fact Sheet for Healthcare Providers: https://hall.com/  This test is not yet approved or  cleared by the [place] FDA and has been authorized for detection and/or diagnosis of SARS-CoV-2 by FDA under an Emergency Use Authorization (EUA).  This EUA will remain in effect (meaning this test can be used) for the duration of the COVID-19 declaration under Section 564(b)(1) of the Act, 21 U.S.C. section 360bbb-3(b)(1), unless the authorization is terminated or revoked sooner.  Performed at [facility], [address] [place], [place] [address]   Urine Culture     Status: Abnormal   Collection Time: 02/13/22  8:09 AM   Specimen: Urine, Clean Catch  Result Value Ref Range Status   Specimen Description URINE, CLEAN CATCH  Final   Special Requests   Final    NONE Performed at [facility], [place] [address] [place], [place] [address]    Culture (A)   Final    >=100,000 COLONIES/mL ESCHERICHIA COLI 60,000 COLONIES/mL ENTEROBACTER CLOACAE    Report Status 02/15/2022 FINAL  Final   Organism ID, Bacteria ESCHERICHIA COLI (A)  Final   Organism ID, Bacteria ENTEROBACTER CLOACAE (A)  Final      Susceptibility   Enterobacter cloacae - MIC*    CEFAZOLIN >=64 RESISTANT Resistant     CEFEPIME 2 SENSITIVE Sensitive     CIPROFLOXACIN <=0.25 SENSITIVE Sensitive     GENTAMICIN <=1 SENSITIVE Sensitive     IMIPENEM 0.5 SENSITIVE Sensitive     NITROFURANTOIN 32 SENSITIVE Sensitive     TRIMETH/SULFA <=20 SENSITIVE Sensitive     PIP/TAZO >=128 RESISTANT Resistant     * 60,000 COLONIES/mL ENTEROBACTER CLOACAE   Escherichia coli - MIC*    AMPICILLIN 4 SENSITIVE Sensitive     CEFAZOLIN <=4 SENSITIVE Sensitive     CEFEPIME <=0.12 SENSITIVE Sensitive     CEFTRIAXONE <=0.25 SENSITIVE Sensitive     CIPROFLOXACIN <=0.25 SENSITIVE Sensitive     GENTAMICIN <=1 SENSITIVE Sensitive     IMIPENEM <=0.25 SENSITIVE Sensitive     NITROFURANTOIN 32 SENSITIVE Sensitive     TRIMETH/SULFA <=20 SENSITIVE Sensitive     AMPICILLIN/SULBACTAM <=2 SENSITIVE Sensitive     PIP/TAZO <=4 SENSITIVE Sensitive     * >=100,000 COLONIES/mL ESCHERICHIA COLI         Radiology Studies: No results found.      Scheduled Meds:  amiodarone  200 mg Oral Daily   apixaban  5 mg Oral BID   busPIRone  5 mg Oral BID   colchicine  0.3 mg Oral Daily   cycloSPORINE  1 drop Both Eyes BID   docusate sodium  100 mg Oral BID   DULoxetine  60 mg Oral Daily   fluticasone  2 spray Each Nare Daily   insulin aspart  0-9 Units Subcutaneous TID WC   loratadine  10 mg Oral Daily   metoprolol succinate  25 mg Oral Daily   multivitamin with minerals   Oral Daily   pantoprazole  40 mg Oral Daily   polyethylene glycol  17 g Oral Daily   potassium chloride SA  60 mEq Oral Daily   rosuvastatin  10 mg Oral QPM   senna-docusate  1 tablet Oral BID   sodium chloride flush  3 mL Intravenous  Q12H   sodium chloride flush  3 mL Intravenous Q12H   sodium chloride flush  3 mL Intravenous Q12H   Continuous Infusions:  sodium chloride     sodium chloride  ferric gluconate (FERRLECIT) IVPB 250 mg (02/17/22 1029)   furosemide 160 mg (02/17/22 0809)     LOS: 5 days    Time spent: 35 minutes    Barb Merino, MD [facility] Pager [phone]

## 2022-02-17 NOTE — Progress Notes (Addendum)
Advanced Heart Failure Rounding Note  PCP-Cardiologist: Buford Dresser, MD   Subjective:    RHC 06/27 Markedly elevated biventricular filling pressures, PA sat low but fick CO preserved Findings:   RA = 27  RV = 62/25 PA = 60/27 (41) PCW = 25 (v = 35) Fick cardiac output/index = 6.8/2.8 PVR = 2.4 WU Ao sat = 90% PA sat = 45%, 49% PAPi = 1.2  Good diuresis again yesterday with IV lasix and metolazone.   Scr 3.0 K 4.0  SR with PACs and PVCs on tele  Feeling okay. Still has some dyspnea and abdominal bloating.    Objective:   Weight Range: (!) 136.4 kg Body mass index is 47.1 kg/m.   Vital Signs:   Temp:  [97.6 F (36.4 C)-97.7 F (36.5 C)] 97.7 F (36.5 C) (06/30 0414) Pulse Rate:  [85-88] 85 (06/30 0414) Resp:  [18-22] 18 (06/30 0414) BP: (100-122)/(59-71) 122/71 (06/30 0414) SpO2:  [95 %-100 %] 95 % (06/30 0414) Weight:  [136.4 kg] 136.4 kg (06/30 0414) Last BM Date : 02/13/22  Weight change: Filed Weights   02/14/22 0438 02/15/22 0338 02/17/22 0414  Weight: (!) 137.8 kg (!) 137.7 kg (!) 136.4 kg    Intake/Output:   Intake/Output Summary (Last 24 hours) at 02/17/2022 0737 Last data filed at 02/17/2022 0500 Gross per 24 hour  Intake 463 ml  Output 4650 ml  Net -4187 ml      Physical Exam    General:  Appears well. Sitting up in bed eating breakfast. HEENT: normal Neck: supple. JVP to jaw. Carotids 2+ bilat; no bruits.  Cor: PMI nondisplaced. Regular rate & rhythm. No rubs, gallops or murmurs. Lungs: clear Abdomen: obese, soft, nontender, nondistended.  Extremities: no cyanosis, clubbing, rash, + UNNA Neuro: alert & orientedx3, cranial nerves grossly intact. moves all 4 extremities w/o difficulty. Affect pleasant     Telemetry   SR 80s, PACs, about 10 PVCs/min  EKG    N/A   Labs    CBC Recent Labs    02/14/22 1304 02/17/22 0252  WBC  --  5.0  HGB 9.5* 8.4*  HCT 28.0* 28.1*  MCV  --  83.1  PLT  --  740   Basic  Metabolic Panel Recent Labs    02/16/22 0618 02/17/22 0252  NA 139 138  K 3.8 4.0  CL 103 100  CO2 27 30  GLUCOSE 104* 129*  BUN 33* 37*  CREATININE 2.90* 3.01*  CALCIUM 9.0 9.3  MG 1.9  --    Liver Function Tests No results for input(s): "AST", "ALT", "ALKPHOS", "BILITOT", "PROT", "ALBUMIN" in the last 72 hours. No results for input(s): "LIPASE", "AMYLASE" in the last 72 hours. Cardiac Enzymes No results for input(s): "CKTOTAL", "CKMB", "CKMBINDEX", "TROPONINI" in the last 72 hours.  BNP: BNP (last 3 results) Recent Labs    01/26/22 1156 02/06/22 1809 02/12/22 1226  BNP 375.6* 550.8* 517.3*    ProBNP (last 3 results) No results for input(s): "PROBNP" in the last 8760 hours.   D-Dimer No results for input(s): "DDIMER" in the last 72 hours. Hemoglobin A1C No results for input(s): "HGBA1C" in the last 72 hours. Fasting Lipid Panel No results for input(s): "CHOL", "HDL", "LDLCALC", "TRIG", "CHOLHDL", "LDLDIRECT" in the last 72 hours. Thyroid Function Tests No results for input(s): "TSH", "T4TOTAL", "T3FREE", "THYROIDAB" in the last 72 hours.  Invalid input(s): "FREET3"  Other results:   Imaging    No results found.   Medications:  Scheduled Medications:  amiodarone  200 mg Oral Daily   apixaban  5 mg Oral BID   busPIRone  5 mg Oral BID   colchicine  0.3 mg Oral Daily   cycloSPORINE  1 drop Both Eyes BID   docusate sodium  100 mg Oral BID   DULoxetine  60 mg Oral Daily   fluticasone  2 spray Each Nare Daily   insulin aspart  0-9 Units Subcutaneous TID WC   loratadine  10 mg Oral Daily   metoprolol succinate  25 mg Oral Daily   multivitamin with minerals   Oral Daily   pantoprazole  40 mg Oral Daily   polyethylene glycol  17 g Oral Daily   potassium chloride SA  60 mEq Oral Daily   rosuvastatin  10 mg Oral QPM   senna-docusate  1 tablet Oral BID   sodium chloride flush  3 mL Intravenous Q12H   sodium chloride flush  3 mL Intravenous Q12H    sodium chloride flush  3 mL Intravenous Q12H    Infusions:  sodium chloride     sodium chloride     ferric gluconate (FERRLECIT) IVPB     furosemide 160 mg (02/16/22 1741)    PRN Medications: sodium chloride, sodium chloride, acetaminophen, albuterol, nitroGLYCERIN, ondansetron (ZOFRAN) IV, oxyCODONE, sodium chloride flush, sodium chloride flush    Patient Profile   66 y.o. female with history of chronic biventricular systolic CHF, PAF/AFL, CKD IV, CAD, valvular heart disease, DMII, OSA. Multiple admissions for decompensated HF mostly in setting of recurrent AF.    Now presenting with a/c CHF.   Assessment/Plan     1. Acute on chronic Biventricular Systolic Heart Failure: - RHC (6/22) with low filling pressures with moderately reduced CO. - Echo (7/22) in setting of AFL with RVR, showed EF 30-35%, moderate LVH, moderate RV dysfunction, PASP 48, s/p MV repair with mild MR and mean gradient 10 mmHg with HR around 130, s/p TV repair with mild-moderate TR, dilated IVC.   - RHC 4/23 markedly elevated biventricular pressures - Echo 4/23 35-40% RV moderately down severe TR. Moderate MS mean grad 67m HG - TEE 06/02: Severe biventricular dysfunction, trivial MR, mean gradient 3 across MV prosthesis, severe TR - Multiple admissions recently for HF mostly occur in setting of recurrent AF. Currently in NSR. - RHC 06/27 markedly elevated biventricular pressures, low PAPi, PA sats low but preserved Fick CO. - NYHA IIIb/IV. Diuresing well but still overloaded. Continue IV lasix 160 BID + 2.5 mg metolazone. - In and out of AFL at recent visits. Maintaining SR this admit. - UNNA boots - GDMT limited by CKD.  - No Bidil for now.  Avoid hypotension w/ CKD  - Continue toprol xl 25 mg daily  - Worry may have end-stage disease. Dr. BHaroldine Lawsdiscussed with Nephrology. Achieving euvolemia takes precedence over Scr. Focus on diuresis and consider HD in future if renal function worsens significantly  with volume removal. - Repeat RHC prior to discharge once she has been diuresis. Will tentatively arrange for Monday, July 3rd.   2. PAF/AFL - She has failed Tikosyn. - She had Maze in 5/22 and DCCV in 6/22 & 04/19/21, last of which she had ERAF. - Multiple admissions recently for HF mostly occur in setting of recurrent AF/AFL. Last DCCV 06/13.  - Currently maintaining SR - Continue amiodarone + Eliquis  - Unable to tolerate AF at all. Follows with EP. Not a candidate for Afib ablation or AV nodal ablation/PPM -  Has OSA, awaiting split night study for CPAP titration.  - Keep K > 4.0 and Mg > 2.0    3. CKD Stage IV   - Nephrology consulted.  - Baseline Scr likely 3-3.5, recent Scr 2-2.5 likely associated with dilution in setting of volume overload. - Scr 3.0 today. - Continue with aggressive diuresis.  - No SGLT2i, GFR has been consistently less than 20   4. CAD - Cath (2019): Mild nonobstructive CAD - No chest pain.  - Continue statin + beta blocker - no ASA w/ Eliquis    5. Valvular Heart Disease: - S/p MV and TV repair in 5/22.  - Echo 02/25/21 with mild-moderate TR.   - Echo 4/23 35-40% RV moderately down. severe TR. Moderate MS mean gradient 11mHG  - TEE 06/23: severe biventricular dysfunction, trivial MR, mean gradient 3 across MV prosthesis, severe TR - Seen by structural heart team. Not a candidate for valve in valve tricuspid replacement d/t severe left heart failure   6. Obesity - Body mass index is 48 - Discussed portion control.    7. DMII - Hgb A1c 6.7% 04/23 - GFR has been too low for SGLT2i   8. OSA - initial sleep study positive but was awaiting split night study to titrate CPAP - Has been referred to Dr. TRadford Pax- suspect this is part of her AF recurrence   9. PVCs - About 10/min on tele this am. - Continue PO amiodarone  - Keep K > 4 and Mag > 2  10. Bacteriuria - Urine culture grew E.Coli and Enterobacter cloacae - Not currently treated d/t lack of  symptoms  11. Anemia - Hgb 8s-9s - Iron stores low - IV iron ordered per Nephrology    Continue to mobilize.  PT/OT.   Length of Stay: 5  FIN[facility], LWest Feliciana PA-C  02/17/2022, 7:37 AM  Advanced Heart Failure Team Pager [phone](M-F; 7a - 5p)  Please contact [facility] for night-coverage after hours (5p -7a ) and weekends on [email or web address]  Patient seen and examined with the above-signed Advanced Practice Provider and/or Housestaff. I personally reviewed laboratory data, imaging studies and relevant notes. I independently examined the patient and formulated the important aspects of the plan. I have edited the note to reflect any of my changes or salient points. I have personally discussed the plan with the patient and/or family.  Out another 4L with aggressive diuresis. Scr climbing slowly. Breathing feels better. Still with DOE though. No orthopnea or PND  General:  Sitting up in bed No resp difficulty HEENT: normal Neck: supple. JVP hard to see  Carotids 2+ bilat; no bruits. No lymphadenopathy or thryomegaly appreciated. Cor: PMI nondisplaced. Regular rate & rhythm. 2/6 TR. Lungs: clear Abdomen: obese soft, nontender, nondistended. No hepatosplenomegaly. No bruits or masses. Good bowel sounds. Extremities: no cyanosis, clubbing, rash, edema Neuro: alert & orientedx3, cranial nerves grossly intact. moves all 4 extremities w/o difficulty. Affect pleasant  Would plan to continue aggressive IV diuresis until volume removal tapers off and then would repeat RHC to see how good we got her. D/w Renal -> agree with plan to diurese as much as needed despite rising Scr and see where renal function and fluid status winds up. Can consider HD as needed. Has been turned down for TV valve in ring due to markedly elevated wedge pressures despite diuresis. Can reassess if/when we finally get her dry. Avoid milrinone with propensity for AF.   DGlori Bickers MD  8:57 AM

## 2022-02-17 NOTE — Progress Notes (Signed)
Nephrology Follow-Up Consult note   Assessment/Recommendations: [name] is a/an 66 y.o. female with a past medical history significant for systolic congestive heart failure, valvular disease, atrial fibrillation, hypertension, diabetes, chronic kidney disease 4 who present w/ heart failure exacerbation    Systolic Heart Failure Exacerbation: Multiple hospitalizations for volume overload.  Some of them associated with atrial fibrillation.  Right heart cath with persistent elevated filling pressures.  Diuresing well at this time. -Continue with aggressive diuresis -Would pay less attention to creatinine and more focus on volume optimization -If creatinine worsens significantly with volume removal we can consider dialysis at that time.  Maintenance of euvolemia takes precedent over creatinine   CKD 4: It is likely her baseline is closer to 3-3.5 and that her creatinines of 2-2.5 are associated with dilution in the setting of volume overload. -continue with diuresis as above -Continue to monitor daily Cr, Dose meds for GFR -Monitor Daily I/Os, Daily weight  -Maintain MAP>65 for optimal renal perfusion.  -Avoid nephrotoxic medications including NSAIDs -Use synthetic opioids (Fentanyl/Dilaudid) if needed -Currently no indication for HD   Hyperlipidemia: On statin   Atrial fibrillation: Rate appropriate at this time on metoprolol   Iron deficiency anemia: CKD may play some role.  Iron saturation 6 and ferritin 38.  IV iron ordered for a total of 1 g   Uncontrolled Diabetes Mellitus Type 2 with Hyperglycemia: Management per primary team   Recommendations conveyed to primary service.    [facility] 02/17/2022 9:53 AM  ___________________________________________________________  CC: Heart failure exacerbation  Interval History/Subjective: Overall feels about the same.  Some shortness of breath and bloating but possibly improved.  Creatinine slightly  higher at 3 with excellent urine output of 4.7 L.  No new weight today   Medications:  Current Facility-Administered Medications  Medication Dose Route Frequency Provider Last Rate Last Admin   0.9 %  sodium chloride infusion  250 mL Intravenous PRN Bensimhon, Shaune Pascal, MD       0.9 %  sodium chloride infusion  250 mL Intravenous PRN Bensimhon, Shaune Pascal, MD       acetaminophen (TYLENOL) tablet 650 mg  650 mg Oral Q4H PRN Bensimhon, Shaune Pascal, MD       albuterol (PROVENTIL) (2.5 MG/3ML) 0.083% nebulizer solution 2.5 mg  2.5 mg Nebulization Q6H PRN Bensimhon, Shaune Pascal, MD       amiodarone (PACERONE) tablet 200 mg  200 mg Oral Daily Bensimhon, Shaune Pascal, MD   200 mg at 02/17/22 5852   apixaban (ELIQUIS) tablet 5 mg  5 mg Oral BID Bensimhon, Shaune Pascal, MD   5 mg at 02/17/22 7782   busPIRone (BUSPAR) tablet 5 mg  5 mg Oral BID Bensimhon, Shaune Pascal, MD   5 mg at 02/17/22 4235   colchicine tablet 0.3 mg  0.3 mg Oral Daily Bensimhon, Shaune Pascal, MD   0.3 mg at 02/17/22 3614   cycloSPORINE (RESTASIS) 0.05 % ophthalmic emulsion 1 drop  1 drop Both Eyes BID Bensimhon, Shaune Pascal, MD   1 drop at 02/17/22 4315   docusate sodium (COLACE) capsule 100 mg  100 mg Oral BID Bensimhon, Shaune Pascal, MD   100 mg at 02/17/22 0811   DULoxetine (CYMBALTA) DR capsule 60 mg  60 mg Oral Daily Bensimhon, Shaune Pascal, MD   60 mg at 02/17/22 0810   ferric gluconate (FERRLECIT) 250 mg in sodium chloride 0.9 % 250 mL IVPB  250 mg Intravenous Daily Reesa Chew,  MD       fluticasone (FLONASE) 50 MCG/ACT nasal spray 2 spray  2 spray Each Nare Daily Bensimhon, Shaune Pascal, MD   2 spray at 02/17/22 0817   furosemide (LASIX) 160 mg in dextrose 5 % 50 mL IVPB  160 mg Intravenous BID Bensimhon, Shaune Pascal, MD 66 mL/hr at 02/17/22 0809 160 mg at 02/17/22 0809   insulin aspart (novoLOG) injection 0-9 Units  0-9 Units Subcutaneous TID WC Bensimhon, Shaune Pascal, MD   1 Units at 02/16/22 1736   loratadine (CLARITIN) tablet 10 mg  10 mg Oral Daily  Bensimhon, Shaune Pascal, MD   10 mg at 02/17/22 6301   metoprolol succinate (TOPROL-XL) 24 hr tablet 25 mg  25 mg Oral Daily Bensimhon, Shaune Pascal, MD   25 mg at 02/17/22 6010   multivitamin with minerals tablet   Oral Daily Bensimhon, Shaune Pascal, MD   1 tablet at 02/17/22 0810   nitroGLYCERIN (NITROSTAT) SL tablet 0.4 mg  0.4 mg Sublingual Q5 Min x 3 PRN Bensimhon, Shaune Pascal, MD       ondansetron [facility]) injection 4 mg  4 mg Intravenous Q6H PRN Bensimhon, Shaune Pascal, MD       oxyCODONE (Oxy IR/ROXICODONE) immediate release tablet 5 mg  5 mg Oral Q6H PRN Bensimhon, Shaune Pascal, MD   5 mg at 02/16/22 2229   pantoprazole (PROTONIX) EC tablet 40 mg  40 mg Oral Daily Bensimhon, Shaune Pascal, MD   40 mg at 02/17/22 0810   polyethylene glycol (MIRALAX / GLYCOLAX) packet 17 g  17 g Oral Daily Joette Catching, [place]   17 g at 02/17/22 9323   potassium chloride SA (KLOR-CON M) CR tablet 60 mEq  60 mEq Oral Daily Bensimhon, Shaune Pascal, MD   60 mEq at 02/17/22 5573   rosuvastatin (CRESTOR) tablet 10 mg  10 mg Oral QPM Bensimhon, Shaune Pascal, MD   10 mg at 02/16/22 1736   senna-docusate (Senokot-S) tablet 1 tablet  1 tablet Oral BID Joette Catching, PA-C   1 tablet at 02/17/22 0810   sodium chloride flush (NS) 0.9 % injection 3 mL  3 mL Intravenous Q12H Bensimhon, Shaune Pascal, MD   3 mL at 02/15/22 2200   sodium chloride flush (NS) 0.9 % injection 3 mL  3 mL Intravenous PRN Bensimhon, Shaune Pascal, MD   3 mL at 02/12/22 2106   sodium chloride flush (NS) 0.9 % injection 3 mL  3 mL Intravenous Q12H Bensimhon, Shaune Pascal, MD   3 mL at 02/17/22 0810   sodium chloride flush (NS) 0.9 % injection 3 mL  3 mL Intravenous Q12H Bensimhon, Shaune Pascal, MD   3 mL at 02/16/22 0847   sodium chloride flush (NS) 0.9 % injection 3 mL  3 mL Intravenous PRN Bensimhon, Shaune Pascal, MD          Review of Systems: 10 systems reviewed and negative except per interval history/subjective  Physical Exam: Vitals:   02/17/22 0028 02/17/22 0414  BP: 113/61  122/71  Pulse: 88 85  Resp: (!) 22 18  Temp:  97.7 F (36.5 C)  SpO2: 99% 95%   Total I/O In: 233 [P.O.:230; I.V.:3] Out: -   Intake/Output Summary (Last 24 hours) at 02/17/2022 0953 Last data filed at 02/17/2022 0830 Gross per 24 hour  Intake 573 ml  Output 4250 ml  Net -3677 ml   Constitutional: well-appearing, no acute distress ENMT: ears and nose without scars or lesions, MMM CV: normal  rate, 2+ pitting edema in the bilateral lower extremities, wrapped Respiratory: Bilateral chest rise, normal work of breathing Gastrointestinal: soft, non-tender, no palpable masses or hernias Skin: no visible lesions or rashes Psych: alert, judgement/insight appropriate, appropriate mood and affect   Test Results I personally reviewed new and old clinical labs and radiology tests Lab Results  Component Value Date   NA 138 02/17/2022   K 4.0 02/17/2022   CL 100 02/17/2022   CO2 30 02/17/2022   BUN 37 (H) 02/17/2022   CREATININE 3.01 (H) 02/17/2022   CALCIUM 9.3 02/17/2022   ALBUMIN 3.3 (L) 02/06/2022   PHOS 3.4 11/22/2021    CBC Recent Labs  Lab 02/12/22 1226 02/14/22 1303 02/14/22 1304 02/17/22 0252  WBC 5.3  --   --  5.0  HGB 8.1* 9.5* 9.5* 8.4*  HCT 27.3* 28.0* 28.0* 28.1*  MCV 84.8  --   --  83.1  PLT 202  --   --  224

## 2022-02-17 NOTE — Care Management Important Message (Signed)
Important Message  Patient Details  Name: [name] MRN: [id number] Date of Birth: [birth year]   Medicare Important Message Given:  Yes     Shelda Altes 02/17/2022, 8:12 AM

## 2022-02-17 NOTE — Plan of Care (Signed)
  Problem: Clinical Measurements: Goal: Will remain free from infection Outcome: Completed/Met   Problem: Nutrition: Goal: Adequate nutrition will be maintained Outcome: Completed/Met   Problem: Coping: Goal: Level of anxiety will decrease Outcome: Completed/Met   Problem: Safety: Goal: Ability to remain free from injury will improve Outcome: Completed/Met   Problem: Metabolic: Goal: Ability to maintain appropriate glucose levels will improve Outcome: Completed/Met   Problem: Nutritional: Goal: Maintenance of adequate nutrition will improve Outcome: Completed/Met

## 2022-02-18 DIAGNOSIS — I50813 Acute on chronic right heart failure: Secondary | ICD-10-CM | POA: Diagnosis not present

## 2022-02-18 LAB — BASIC METABOLIC PANEL
Anion gap: 13 (ref 5–15)
BUN: 42 mg/dL — ABNORMAL HIGH (ref 8–23)
CO2: 30 mmol/L (ref 22–32)
Calcium: 9.4 mg/dL (ref 8.9–10.3)
Chloride: 99 mmol/L (ref 98–111)
Creatinine, Ser: 3.33 mg/dL — ABNORMAL HIGH (ref 0.44–1.00)
GFR, Estimated: 15 mL/min — ABNORMAL LOW (ref >60)
Glucose, Bld: 119 mg/dL — ABNORMAL HIGH (ref 70–99)
Potassium: 3.8 mmol/L (ref 3.5–5.1)
Sodium: 142 mmol/L (ref 135–145)

## 2022-02-18 LAB — GLUCOSE, CAPILLARY
Glucose-Capillary: 117 mg/dL — ABNORMAL HIGH (ref 70–99)
Glucose-Capillary: 128 mg/dL — ABNORMAL HIGH (ref 70–99)
Glucose-Capillary: 105 mg/dL — ABNORMAL HIGH (ref 70–99)
Glucose-Capillary: 135 mg/dL — ABNORMAL HIGH (ref 70–99)

## 2022-02-18 MED ORDER — BISACODYL 10 MG RE SUPP
10.0000 mg | Freq: Once | RECTAL | Status: AC
Start: 2022-02-18 — End: 2022-02-18
  Administered 2022-02-18: 10 mg via RECTAL
  Filled 2022-02-18: qty 1

## 2022-02-18 MED ORDER — SODIUM CHLORIDE 0.9 % IV SOLN
510.0000 mg | Freq: Once | INTRAVENOUS | Status: AC
  Administered 2022-02-18: 510 mg via INTRAVENOUS
  Filled 2022-02-18: qty 17

## 2022-02-18 NOTE — Progress Notes (Signed)
PROGRESS NOTE  [name]  XNA:[id number] DOB: 18-Aug-1956 DOA: 02/12/2022 PCP: [name], NP   Brief Narrative:  Patient is a 66 year old female with history of systolic congestive heart failure, severe right ventricular failure, status post mitral valve repair, severe tricuspid regurgitation, chronic A-fib, CKD stage IV who presented here with orthopnea, PND, weight gain and found to have fluid overload.  Currently on IV diuresis.  Cardiology, nephrology following.  Assessment & Plan:  Principal Problem:   Acute on chronic right heart failure (HCC) Active Problems:   AF (paroxysmal atrial fibrillation) (HCC)   Essential hypertension   Diabetes mellitus type 2 in obese (HCC)   Type 2 diabetes mellitus with hyperlipidemia (HCC)   Depression   Class 3 obesity (HCC)   NICM (nonischemic cardiomyopathy) ([place])   S/P mitral valve repair   Prolonged QT interval   Anxiety and depression   GERD (gastroesophageal reflux disease)  Acute on chronic biventricular systolic heart failure : Echocardiogram on 6/23 showed normal ejection fraction.  Right heart cath on 6/27 showed elevated biventricular pressure.  Presented with fluid overload, orthopnea, PND.  Currently on Lasix 160 mg twice a day along with metolazone.  Being followed by advanced heart failure team.  Continue metoprolol.  Monitor BMP. Cardiology planning for repeat right heart cath  Paroxysmal A-fib: Failed Tikosyn.  Status post maze procedure on 5/22.  Underwent multiple cardioversions.  Currently in normal sinus rhythm.  Continue amiodarone, on Eliquis for anticoagulation.  CKD stage IV: Currently kidney function around baseline.  Monitor volume status.  Monitor BMP.  Nephrology following.  Normocytic anemia: Most likely in the setting of chronic kidney disease.  Iron studies showed low iron.  Given IV iron  Coronary disease: No anginal symptoms at present.  Continue statin, beta-blocker.  Diabetes type 2: On  Trulicity at home.  Continue current insulin regimen.  Monitor blood sugars  Depression/anxiety: On Cymbalta, BuSpar  History of sleep apnea: Not on CPAP.  Seen in the process of getting a new sleep study  Asymptomatic bacteriuria: Urine culture from 6/26 showed E. coli. Urinary symptoms.  Deferred treatment with antibiotics  Morbid obesity: BMI  of 45            DVT prophylaxis: apixaban (ELIQUIS) tablet 5 mg     Code Status: Full Code  Family Communication: None at bedside  Patient status:Inpatient  Patient is from :Home  Anticipated discharge RK:YHCW  Estimated DC date:2-3 days.  Needs cardiology clearance   Consultants: Cardiology, nephrology  Procedures: None  Antimicrobials:  Anti-infectives (From admission, onward)    None       Subjective: Patient seen and examined at the bedside this morning.  Hemodynamically stable.  Lying in bed.  On room air.  Appears comfortable without any dyspnea or cough.  Objective: Vitals:   02/17/22 1035 02/17/22 1921 02/18/22 0000 02/18/22 0311  BP: (!) 110/52 (!) 111/56  102/78  Pulse: 83 81  77  Resp: '19 19 16 17  '$ Temp: 97.6 F (36.4 C) 97.7 F (36.5 C)  97.9 F (36.6 C)  TempSrc: Oral Oral  Oral  SpO2: 97% 98%  94%  Weight:    131.5 kg  Height:        Intake/Output Summary (Last 24 hours) at 02/18/2022 0728 Last data filed at 02/18/2022 0315 Gross per 24 hour  Intake 713 ml  Output 4600 ml  Net -3887 ml   Filed Weights   02/15/22 0338 02/17/22 0414 02/18/22 0311  Weight: Marland Kitchen)  137.7 kg (!) 136.4 kg 131.5 kg    Examination:  General exam: Overall comfortable, not in distress, morbidly obese HEENT: PERRL Respiratory system:  no wheezes or crackles  Cardiovascular system: S1 & S2 heard, RRR.  Gastrointestinal system: Abdomen is nondistended, soft and nontender. Central nervous system: Alert and oriented Extremities: Bilateral lower extremity pitting edema/wraps, no clubbing ,no cyanosis Skin: No  rashes, no ulcers,no icterus     Data Reviewed: I have personally reviewed following labs and imaging studies  CBC: Recent Labs  Lab 02/12/22 1226 02/14/22 1303 02/14/22 1304 02/17/22 0252  WBC 5.3  --   --  5.0  HGB 8.1* 9.5* 9.5* 8.4*  HCT 27.3* 28.0* 28.0* 28.1*  MCV 84.8  --   --  83.1  PLT 202  --   --  811   Basic Metabolic Panel: Recent Labs  Lab 02/12/22 1226 02/13/22 0325 02/14/22 0218 02/14/22 1303 02/14/22 1304 02/15/22 0326 02/16/22 0618 02/17/22 0252 02/18/22 0240  NA 141 139 136   < > 141 138 139 138 142  K 3.9 3.6 3.7   < > 3.5 3.4* 3.8 4.0 3.8  CL 105 108 102  --   --  101 103 100 99  CO2 '25 25 25  '$ --   --  '27 27 30 30  '$ GLUCOSE 102* 100* 124*  --   --  128* 104* 129* 119*  BUN 21 20 24*  --   --  27* 33* 37* 42*  CREATININE 2.25* 2.40* 2.84*  --   --  2.80* 2.90* 3.01* 3.33*  CALCIUM 9.6 8.6* 8.8*  --   --  8.8* 9.0 9.3 9.4  MG 1.8 1.9  --   --   --   --  1.9 2.1  --    < > = values in this interval not displayed.     Recent Results (from the past 240 hour(s))  SARS Coronavirus 2 by RT PCR (hospital order, performed in [facility] hospital lab) *cepheid single result test* Anterior Nasal Swab     Status: None   Collection Time: 02/12/22 12:26 PM   Specimen: Anterior Nasal Swab  Result Value Ref Range Status   SARS Coronavirus 2 by RT PCR NEGATIVE NEGATIVE Final    Comment: (NOTE) SARS-CoV-2 target nucleic acids are NOT DETECTED.  The SARS-CoV-2 RNA is generally detectable in upper and lower respiratory specimens during the acute phase of infection. The lowest concentration of SARS-CoV-2 viral copies this assay can detect is 250 copies / mL. A negative result does not preclude SARS-CoV-2 infection and should not be used as the sole basis for treatment or other patient management decisions.  A negative result may occur with improper specimen collection / handling, submission of specimen other than nasopharyngeal swab, presence of viral  mutation(s) within the areas targeted by this assay, and inadequate number of viral copies (<250 copies / mL). A negative result must be combined with clinical observations, patient history, and epidemiological information.  Fact Sheet for Patients:   https://www.patel.info/  Fact Sheet for Healthcare Providers: https://hall.com/  This test is not yet approved or  cleared by the [place] FDA and has been authorized for detection and/or diagnosis of SARS-CoV-2 by FDA under an Emergency Use Authorization (EUA).  This EUA will remain in effect (meaning this test can be used) for the duration of the COVID-19 declaration under Section 564(b)(1) of the Act, 21 U.S.C. section 360bbb-3(b)(1), unless the authorization is terminated or revoked sooner.  Performed at [facility], [address] [place], [place] [address]   Urine Culture     Status: Abnormal   Collection Time: 02/13/22  8:09 AM   Specimen: Urine, Clean Catch  Result Value Ref Range Status   Specimen Description URINE, CLEAN CATCH  Final   Special Requests   Final    NONE Performed at [facility], [place] [address], [place], [place] [address]    Culture (A)  Final    >=100,000 COLONIES/mL ESCHERICHIA COLI 60,000 COLONIES/mL ENTEROBACTER CLOACAE    Report Status 02/15/2022 FINAL  Final   Organism ID, Bacteria ESCHERICHIA COLI (A)  Final   Organism ID, Bacteria ENTEROBACTER CLOACAE (A)  Final      Susceptibility   Enterobacter cloacae - MIC*    CEFAZOLIN >=64 RESISTANT Resistant     CEFEPIME 2 SENSITIVE Sensitive     CIPROFLOXACIN <=0.25 SENSITIVE Sensitive     GENTAMICIN <=1 SENSITIVE Sensitive     IMIPENEM 0.5 SENSITIVE Sensitive     NITROFURANTOIN 32 SENSITIVE Sensitive     TRIMETH/SULFA <=20 SENSITIVE Sensitive     PIP/TAZO >=128 RESISTANT Resistant     * 60,000 COLONIES/mL ENTEROBACTER CLOACAE   Escherichia coli - MIC*    AMPICILLIN 4  SENSITIVE Sensitive     CEFAZOLIN <=4 SENSITIVE Sensitive     CEFEPIME <=0.12 SENSITIVE Sensitive     CEFTRIAXONE <=0.25 SENSITIVE Sensitive     CIPROFLOXACIN <=0.25 SENSITIVE Sensitive     GENTAMICIN <=1 SENSITIVE Sensitive     IMIPENEM <=0.25 SENSITIVE Sensitive     NITROFURANTOIN 32 SENSITIVE Sensitive     TRIMETH/SULFA <=20 SENSITIVE Sensitive     AMPICILLIN/SULBACTAM <=2 SENSITIVE Sensitive     PIP/TAZO <=4 SENSITIVE Sensitive     * >=100,000 COLONIES/mL ESCHERICHIA COLI     Radiology Studies: No results found.  Scheduled Meds:  amiodarone  200 mg Oral Daily   apixaban  5 mg Oral BID   busPIRone  5 mg Oral BID   colchicine  0.3 mg Oral Daily   cycloSPORINE  1 drop Both Eyes BID   docusate sodium  100 mg Oral BID   DULoxetine  60 mg Oral Daily   fluticasone  2 spray Each Nare Daily   insulin aspart  0-9 Units Subcutaneous TID WC   loratadine  10 mg Oral Daily   metoprolol succinate  25 mg Oral Daily   multivitamin with minerals   Oral Daily   pantoprazole  40 mg Oral Daily   polyethylene glycol  17 g Oral Daily   potassium chloride SA  60 mEq Oral Daily   rosuvastatin  10 mg Oral QPM   senna-docusate  1 tablet Oral BID   sodium chloride flush  3 mL Intravenous Q12H   sodium chloride flush  3 mL Intravenous Q12H   sodium chloride flush  3 mL Intravenous Q12H   Continuous Infusions:  sodium chloride     sodium chloride     ferric gluconate (FERRLECIT) IVPB 250 mg (02/17/22 1029)   furosemide 160 mg (02/17/22 1713)     LOS: 6 days   Shelly Coss, MD [facility] P7/08/2021, 7:28 AM

## 2022-02-18 NOTE — Progress Notes (Signed)
Nephrology Follow-Up Consult note   Assessment/Recommendations: [name] is a/an 66 y.o. female with a past medical history significant for systolic congestive heart failure, valvular disease, atrial fibrillation, hypertension, diabetes, chronic kidney disease 4 who present w/ heart failure exacerbation    Systolic Heart Failure Exacerbation: Multiple hospitalizations for volume overload.  Some of them associated with atrial fibrillation.  Right heart cath with persistent elevated filling pressures.  Diuresing well at this time.  Weight has decreased by about 10 kg since admission -Continue with aggressive diuresis with IV Lasix today -Would pay less attention to creatinine and more focus on volume optimization -If creatinine worsens significantly with volume removal we can consider dialysis at that time.  Maintenance of euvolemia takes precedent over creatinine   CKD 4: It is likely her baseline is closer to 3-3.5 and that her creatinines of 2-2.5 are associated with dilution in the setting of volume overload. -continue with diuresis as above; creatinine has risen but is currently in her previous baseline.  Would not be surprised if creatinine goes higher as we optimize her volume status but this takes precedent. -Continue to monitor daily Cr, Dose meds for GFR -Monitor Daily I/Os, Daily weight  -Maintain MAP>65 for optimal renal perfusion.  -Avoid nephrotoxic medications including NSAIDs -Use synthetic opioids (Fentanyl/Dilaudid) if needed -Currently no indication for HD   Hyperlipidemia: On statin   Atrial fibrillation: Rate appropriate at this time on metoprolol   Iron deficiency anemia: CKD may play some role.  Iron saturation 6 and ferritin 38.  IV iron ordered for a total of 1 g   Uncontrolled Diabetes Mellitus Type 2 with Hyperglycemia: Management per primary team   Recommendations conveyed to primary service.    [facility] 02/18/2022 8:45 AM  ___________________________________________________________  CC: Heart failure exacerbation  Interval History/Subjective: Patient has a headache this morning.  Otherwise denies any complaints.  Urine output continues to be robust.  Creatinine slightly higher at 3.3   Medications:  Current Facility-Administered Medications  Medication Dose Route Frequency Provider Last Rate Last Admin   0.9 %  sodium chloride infusion  250 mL Intravenous PRN Bensimhon, Shaune Pascal, MD       0.9 %  sodium chloride infusion  250 mL Intravenous PRN Bensimhon, Shaune Pascal, MD       acetaminophen (TYLENOL) tablet 650 mg  650 mg Oral Q4H PRN Bensimhon, Shaune Pascal, MD       albuterol (PROVENTIL) (2.5 MG/3ML) 0.083% nebulizer solution 2.5 mg  2.5 mg Nebulization Q6H PRN Bensimhon, Shaune Pascal, MD       amiodarone (PACERONE) tablet 200 mg  200 mg Oral Daily Bensimhon, Shaune Pascal, MD   200 mg at 02/17/22 1751   apixaban (ELIQUIS) tablet 5 mg  5 mg Oral BID Bensimhon, Shaune Pascal, MD   5 mg at 02/17/22 2114   busPIRone (BUSPAR) tablet 5 mg  5 mg Oral BID Bensimhon, Shaune Pascal, MD   5 mg at 02/17/22 2114   colchicine tablet 0.3 mg  0.3 mg Oral Daily Bensimhon, Shaune Pascal, MD   0.3 mg at 02/17/22 0258   cycloSPORINE (RESTASIS) 0.05 % ophthalmic emulsion 1 drop  1 drop Both Eyes BID Bensimhon, Shaune Pascal, MD   1 drop at 02/17/22 2115   docusate sodium (COLACE) capsule 100 mg  100 mg Oral BID Bensimhon, Shaune Pascal, MD   100 mg at 02/17/22 2114   DULoxetine (CYMBALTA) DR capsule 60 mg  60 mg Oral Daily  Bensimhon, Shaune Pascal, MD   60 mg at 02/17/22 0810   ferric gluconate (FERRLECIT) 250 mg in sodium chloride 0.9 % 250 mL IVPB  250 mg Intravenous Daily Reesa Chew, MD 135 mL/hr at 02/17/22 1029 250 mg at 02/17/22 1029   fluticasone (FLONASE) 50 MCG/ACT nasal spray 2 spray  2 spray Each Nare Daily Bensimhon, Shaune Pascal, MD   2 spray at 02/17/22 0817   furosemide (LASIX) 160 mg in dextrose 5 % 50 mL IVPB  160 mg  Intravenous BID Bensimhon, Shaune Pascal, MD 66 mL/hr at 02/17/22 1713 160 mg at 02/17/22 1713   insulin aspart (novoLOG) injection 0-9 Units  0-9 Units Subcutaneous TID WC Bensimhon, Shaune Pascal, MD   1 Units at 02/17/22 1710   loratadine (CLARITIN) tablet 10 mg  10 mg Oral Daily Bensimhon, Shaune Pascal, MD   10 mg at 02/17/22 6578   metoprolol succinate (TOPROL-XL) 24 hr tablet 25 mg  25 mg Oral Daily Bensimhon, Shaune Pascal, MD   25 mg at 02/17/22 4696   multivitamin with minerals tablet   Oral Daily Bensimhon, Shaune Pascal, MD   1 tablet at 02/17/22 0810   nitroGLYCERIN (NITROSTAT) SL tablet 0.4 mg  0.4 mg Sublingual Q5 Min x 3 PRN Bensimhon, Shaune Pascal, MD       ondansetron [facility]) injection 4 mg  4 mg Intravenous Q6H PRN Bensimhon, Shaune Pascal, MD       oxyCODONE (Oxy IR/ROXICODONE) immediate release tablet 5 mg  5 mg Oral Q6H PRN Bensimhon, Shaune Pascal, MD   5 mg at 02/16/22 2229   pantoprazole (PROTONIX) EC tablet 40 mg  40 mg Oral Daily Bensimhon, Shaune Pascal, MD   40 mg at 02/17/22 0810   polyethylene glycol (MIRALAX / GLYCOLAX) packet 17 g  17 g Oral Daily Joette Catching, [place]   17 g at 02/17/22 2952   potassium chloride SA (KLOR-CON M) CR tablet 60 mEq  60 mEq Oral Daily Bensimhon, Shaune Pascal, MD   60 mEq at 02/17/22 8413   rosuvastatin (CRESTOR) tablet 10 mg  10 mg Oral QPM Bensimhon, Shaune Pascal, MD   10 mg at 02/17/22 1710   senna-docusate (Senokot-S) tablet 1 tablet  1 tablet Oral BID Joette Catching, PA-C   1 tablet at 02/17/22 2114   sodium chloride flush (NS) 0.9 % injection 3 mL  3 mL Intravenous Q12H Bensimhon, Shaune Pascal, MD   3 mL at 02/17/22 1046   sodium chloride flush (NS) 0.9 % injection 3 mL  3 mL Intravenous PRN Bensimhon, Shaune Pascal, MD   3 mL at 02/12/22 2106   sodium chloride flush (NS) 0.9 % injection 3 mL  3 mL Intravenous Q12H Bensimhon, Shaune Pascal, MD   3 mL at 02/17/22 0810   sodium chloride flush (NS) 0.9 % injection 3 mL  3 mL Intravenous Q12H Bensimhon, Shaune Pascal, MD   3 mL at 02/17/22  1046   sodium chloride flush (NS) 0.9 % injection 3 mL  3 mL Intravenous PRN Bensimhon, Shaune Pascal, MD          Review of Systems: 10 systems reviewed and negative except per interval history/subjective  Physical Exam: Vitals:   02/18/22 0000 02/18/22 0311  BP:  102/78  Pulse:  77  Resp: 16 17  Temp:  97.9 F (36.6 C)  SpO2:  94%   No intake/output data recorded.  Intake/Output Summary (Last 24 hours) at 02/18/2022 0846 Last data filed at 02/18/2022 [id number]  Gross per 24 hour  Intake 480 ml  Output 4600 ml  Net -4120 ml   Constitutional: well-appearing, no acute distress ENMT: ears and nose without scars or lesions, MMM CV: normal rate, 2+ pitting edema in the bilateral lower extremities, wrapped Respiratory: Bilateral chest rise, normal work of breathing Gastrointestinal: soft, non-tender, no palpable masses or hernias Skin: no visible lesions or rashes Psych: alert, judgement/insight appropriate, appropriate mood and affect   Test Results I personally reviewed new and old clinical labs and radiology tests Lab Results  Component Value Date   NA 142 02/18/2022   K 3.8 02/18/2022   CL 99 02/18/2022   CO2 30 02/18/2022   BUN 42 (H) 02/18/2022   CREATININE 3.33 (H) 02/18/2022   CALCIUM 9.4 02/18/2022   ALBUMIN 3.3 (L) 02/06/2022   PHOS 3.4 11/22/2021    CBC Recent Labs  Lab 02/12/22 1226 02/14/22 1303 02/14/22 1304 02/17/22 0252  WBC 5.3  --   --  5.0  HGB 8.1* 9.5* 9.5* 8.4*  HCT 27.3* 28.0* 28.0* 28.1*  MCV 84.8  --   --  83.1  PLT 202  --   --  224

## 2022-02-18 NOTE — Progress Notes (Signed)
Patient ID: [name], female   DOB: 23-Apr-1956, 66 y.o.   MRN: [id number]     Advanced Heart Failure Rounding Note  PCP-Cardiologist: Buford Dresser, MD   Subjective:    RHC 06/27 Markedly elevated biventricular filling pressures, PA sat low but fick CO preserved Findings:   RA = 27  RV = 62/25 PA = 60/27 (41) PCW = 25 (v = 35) Fick cardiac output/index = 6.8/2.8 PVR = 2.4 WU Ao sat = 90% PA sat = 45%, 49% PAPi = 1.2  Good diuresis again yesterday with IV lasix 160 mg bid.  Weight trending down.    Creatinine 3.01 => 3.3.   SR with PACs and PVCs on telemetry  No dyspnea at rest or around her room.    Objective:   Weight Range: 131.5 kg Body mass index is 45.42 kg/m.   Vital Signs:   Temp:  [97.6 F (36.4 C)-97.9 F (36.6 C)] 97.9 F (36.6 C) (07/01 0311) Pulse Rate:  [77-83] 77 (07/01 0311) Resp:  [16-19] 17 (07/01 0311) BP: (102-111)/(52-78) 102/78 (07/01 0311) SpO2:  [94 %-98 %] 94 % (07/01 0311) Weight:  [131.5 kg] 131.5 kg (07/01 0311) Last BM Date : 02/13/22  Weight change: Filed Weights   02/15/22 0338 02/17/22 0414 02/18/22 0311  Weight: (!) 137.7 kg (!) 136.4 kg 131.5 kg    Intake/Output:   Intake/Output Summary (Last 24 hours) at 02/18/2022 0857 Last data filed at 02/18/2022 0315 Gross per 24 hour  Intake 480 ml  Output 4600 ml  Net -4120 ml      Physical Exam    General: NAD Neck: JVP 16+, no thyromegaly or thyroid nodule.  Lungs: Clear to auscultation bilaterally with normal respiratory effort. CV: Nondisplaced PMI.  Heart regular S1/S2, no S3/S4, 3/6 HSM apex.  1+ edema to knees.  Abdomen: Soft, nontender, no hepatosplenomegaly, no distention.  Skin: Intact without lesions or rashes.  Neurologic: Alert and oriented x 3.  Psych: Normal affect. Extremities: No clubbing or cyanosis.  HEENT: Normal.   Telemetry   NSR with PACs (personally reviewed)  EKG    N/A   Labs    CBC Recent Labs    02/17/22 0252  WBC  5.0  HGB 8.4*  HCT 28.1*  MCV 83.1  PLT 151   Basic Metabolic Panel Recent Labs    02/16/22 0618 02/17/22 0252 02/18/22 0240  NA 139 138 142  K 3.8 4.0 3.8  CL 103 100 99  CO2 '27 30 30  '$ GLUCOSE 104* 129* 119*  BUN 33* 37* 42*  CREATININE 2.90* 3.01* 3.33*  CALCIUM 9.0 9.3 9.4  MG 1.9 2.1  --    Liver Function Tests No results for input(s): "AST", "ALT", "ALKPHOS", "BILITOT", "PROT", "ALBUMIN" in the last 72 hours. No results for input(s): "LIPASE", "AMYLASE" in the last 72 hours. Cardiac Enzymes No results for input(s): "CKTOTAL", "CKMB", "CKMBINDEX", "TROPONINI" in the last 72 hours.  BNP: BNP (last 3 results) Recent Labs    01/26/22 1156 02/06/22 1809 02/12/22 1226  BNP 375.6* 550.8* 517.3*    ProBNP (last 3 results) No results for input(s): "PROBNP" in the last 8760 hours.   D-Dimer No results for input(s): "DDIMER" in the last 72 hours. Hemoglobin A1C No results for input(s): "HGBA1C" in the last 72 hours. Fasting Lipid Panel No results for input(s): "CHOL", "HDL", "LDLCALC", "TRIG", "CHOLHDL", "LDLDIRECT" in the last 72 hours. Thyroid Function Tests No results for input(s): "TSH", "T4TOTAL", "T3FREE", "THYROIDAB" in the last 72  hours.  Invalid input(s): "FREET3"  Other results:   Imaging    No results found.   Medications:     Scheduled Medications:  amiodarone  200 mg Oral Daily   apixaban  5 mg Oral BID   busPIRone  5 mg Oral BID   colchicine  0.3 mg Oral Daily   cycloSPORINE  1 drop Both Eyes BID   docusate sodium  100 mg Oral BID   DULoxetine  60 mg Oral Daily   fluticasone  2 spray Each Nare Daily   insulin aspart  0-9 Units Subcutaneous TID WC   loratadine  10 mg Oral Daily   metoprolol succinate  25 mg Oral Daily   multivitamin with minerals   Oral Daily   pantoprazole  40 mg Oral Daily   polyethylene glycol  17 g Oral Daily   potassium chloride SA  60 mEq Oral Daily   rosuvastatin  10 mg Oral QPM   senna-docusate  1 tablet  Oral BID   sodium chloride flush  3 mL Intravenous Q12H   sodium chloride flush  3 mL Intravenous Q12H   sodium chloride flush  3 mL Intravenous Q12H    Infusions:  sodium chloride     sodium chloride     ferumoxytol (FERAHEME) 510 mg in sodium chloride 0.9 % 100 mL IVPB     furosemide 160 mg (02/17/22 1713)    PRN Medications: sodium chloride, sodium chloride, acetaminophen, albuterol, nitroGLYCERIN, ondansetron (ZOFRAN) IV, oxyCODONE, sodium chloride flush, sodium chloride flush    Patient Profile   66 y.o. female with history of chronic biventricular systolic CHF, PAF/AFL, CKD IV, CAD, valvular heart disease, DMII, OSA. Multiple admissions for decompensated HF mostly in setting of recurrent AF.    Now presenting with a/c CHF.   Assessment/Plan     1. Acute on chronic Biventricular Systolic Heart Failure: - RHC (6/22) with low filling pressures with moderately reduced CO. - Echo (7/22) in setting of AFL with RVR, showed EF 30-35%, moderate LVH, moderate RV dysfunction, PASP 48, s/p MV repair with mild MR and mean gradient 10 mmHg with HR around 130, s/p TV repair with mild-moderate TR, dilated IVC.   - RHC 4/23 markedly elevated biventricular pressures - Echo 4/23 35-40% RV moderately down severe TR. Moderate MS mean grad 8m HG - TEE 06/02: Severe biventricular dysfunction, trivial MR, mean gradient 3 across MV prosthesis, severe TR - Multiple admissions recently for HF mostly occur in setting of recurrent AF. Currently in NSR. - RHC 06/27 markedly elevated biventricular pressures, low PAPi, PA sats low but preserved Fick CO. - NYHA IIIb/IV. Diuresing well but still seems quite volume overloaded.  She did well yesterday in terms of diuresis with Lasix 160 mg IV bid and no metolazone, will continue the same today. Creatinine slowly rising, 3.3 today.  - In and out of AFL at recent visits. Maintaining SR this admit. - UNNA boots - GDMT limited by CKD.  - No Bidil for now.   Avoid hypotension w/ CKD  - Continue toprol xl 25 mg daily  - Worry may have end-stage disease. Still very volume overloaded, continue diuresis as above.  I worry that she may end up with dialysis.  - Repeat RHC prior to discharge once she has been diuresed further => set for Monday.  - Avoid milrinone with propensity for AF.    2. PAF/AFL - She has failed Tikosyn. - She had Maze in 5/22 and DCCV in 6/22 & 04/19/21,  last of which she had ERAF. - Multiple admissions recently for HF mostly occur in setting of recurrent AF/AFL. Last DCCV 06/13.  - Currently maintaining SR - Continue amiodarone + Eliquis  - Unable to tolerate AF at all. Follows with EP. Not a candidate for Afib ablation or AV nodal ablation/PPM - Has OSA, awaiting split night study for CPAP titration.  - Keep K > 4.0 and Mg > 2.0    3. CKD Stage IV   - Nephrology consulted.  - Baseline Scr likely 3-3.5, recent Scr 2-2.5 likely associated with dilution in setting of volume overload. - Scr 3.0 => 3.3 today. - Continue with aggressive diuresis. I worry that she will eventually need HD for volume management.  - No SGLT2i, GFR has been consistently less than 20   4. CAD - Cath (2019): Mild nonobstructive CAD - No chest pain.  - Continue statin + beta blocker - no ASA w/ Eliquis    5. Valvular Heart Disease: - S/p MV and TV repair in 5/22.  - Echo 02/25/21 with mild-moderate TR.   - Echo 4/23 35-40% RV moderately down. severe TR. Moderate MS mean gradient 74mHG  - TEE 06/23: severe biventricular dysfunction, trivial MR, mean gradient 3 across MV prosthesis, severe TR - Seen by structural heart team. Has been turned down for TV valve in ring due to markedly elevated wedge pressures despite diuresis.   6. Obesity - Body mass index is 48 - Discussed portion control.    7. DMII - Hgb A1c 6.7% 04/23 - GFR has been too low for SGLT2i   8. OSA - initial sleep study positive but was awaiting split night study to titrate  CPAP - Has been referred to Dr. TRadford Pax- suspect this is part of her AF recurrence   9. PVCs - About 10/min on tele this am. - Continue PO amiodarone  - Keep K > 4 and Mag > 2  10. Bacteriuria - Urine culture grew E.Coli and Enterobacter cloacae - Not currently treated d/t lack of symptoms  11. Anemia - Hgb 8s-9s - Iron stores low - Feraheme ordered.   Continue to mobilize.  PT/OT.   Length of Stay: 6  DLoralie Champagne MD  02/18/2022, 8:57 AM  Advanced Heart Failure Team Pager [phone](M-F; 7a - 5p)  Please contact [facility] for night-coverage after hours (5p -7a ) and weekends on [email or web address]

## 2022-02-19 DIAGNOSIS — I50813 Acute on chronic right heart failure: Secondary | ICD-10-CM | POA: Diagnosis not present

## 2022-02-19 LAB — BASIC METABOLIC PANEL
Anion gap: 11 (ref 5–15)
BUN: 42 mg/dL — ABNORMAL HIGH (ref 8–23)
CO2: 31 mmol/L (ref 22–32)
Calcium: 9.3 mg/dL (ref 8.9–10.3)
Chloride: 98 mmol/L (ref 98–111)
Creatinine, Ser: 3.1 mg/dL — ABNORMAL HIGH (ref 0.44–1.00)
GFR, Estimated: 16 mL/min — ABNORMAL LOW (ref >60)
Glucose, Bld: 99 mg/dL (ref 70–99)
Potassium: 3.5 mmol/L (ref 3.5–5.1)
Sodium: 140 mmol/L (ref 135–145)

## 2022-02-19 LAB — GLUCOSE, CAPILLARY
Glucose-Capillary: 111 mg/dL — ABNORMAL HIGH (ref 70–99)
Glucose-Capillary: 125 mg/dL — ABNORMAL HIGH (ref 70–99)
Glucose-Capillary: 131 mg/dL — ABNORMAL HIGH (ref 70–99)
Glucose-Capillary: 150 mg/dL — ABNORMAL HIGH (ref 70–99)

## 2022-02-19 MED ORDER — SODIUM CHLORIDE 0.9 % IV SOLN: 250.0000 mL | INTRAVENOUS | Status: DC | PRN

## 2022-02-19 MED ORDER — SODIUM CHLORIDE 0.9% FLUSH
3.0000 mL | Freq: Two times a day (BID) | INTRAVENOUS | Status: DC
  Administered 2022-02-21 (×2): 3 mL via INTRAVENOUS

## 2022-02-19 MED ORDER — SODIUM CHLORIDE 0.9% FLUSH: 3.0000 mL | INTRAVENOUS | Status: DC | PRN

## 2022-02-19 MED ORDER — POTASSIUM CHLORIDE CRYS ER 20 MEQ PO TBCR
60.0000 meq | EXTENDED_RELEASE_TABLET | Freq: Two times a day (BID) | ORAL | Status: DC
Start: 2022-02-19 — End: 2022-02-24
  Administered 2022-02-19 – 2022-02-24 (×11): 60 meq via ORAL
  Filled 2022-02-19 (×11): qty 3

## 2022-02-19 MED ORDER — SODIUM CHLORIDE 0.9 % IV SOLN: INTRAVENOUS | Status: DC

## 2022-02-19 MED ORDER — METOLAZONE 2.5 MG PO TABS
2.5000 mg | ORAL_TABLET | ORAL | Status: AC
Start: 2022-02-19 — End: 2022-02-19
  Administered 2022-02-19: 2.5 mg via ORAL
  Filled 2022-02-19: qty 1

## 2022-02-19 NOTE — Progress Notes (Signed)
PROGRESS NOTE  [name]  XNA:[id number] DOB: Feb 03, 1956 DOA: 02/12/2022 PCP: [name], NP   Brief Narrative:  Patient is a 66 year old female with history of systolic congestive heart failure, severe right ventricular failure, status post mitral valve repair, severe tricuspid regurgitation, chronic A-fib, CKD stage IV who presented here with orthopnea, PND, weight gain and found to have fluid overload.  Currently on IV diuresis.  Cardiology, nephrology following.  Assessment & Plan:  Principal Problem:   Acute on chronic right heart failure (HCC) Active Problems:   AF (paroxysmal atrial fibrillation) (HCC)   Essential hypertension   Diabetes mellitus type 2 in obese (HCC)   Type 2 diabetes mellitus with hyperlipidemia (HCC)   Depression   Class 3 obesity (HCC)   NICM (nonischemic cardiomyopathy) ([place])   S/P mitral valve repair   Prolonged QT interval   Anxiety and depression   GERD (gastroesophageal reflux disease)  Acute on chronic biventricular systolic heart failure : Echocardiogram on 6/23 showed normal ejection fraction.  Right heart cath on 6/27 showed elevated biventricular pressure.  Presented with fluid overload, orthopnea, PND.  Currently on Lasix 160 mg twice a day along with metolazone.  Being followed by advanced heart failure team.  Continue metoprolol.  Monitor BMP.  Having good diuresis.  Remains on room air. Cardiology planning for repeat right heart cath  Paroxysmal A-fib: Failed Tikosyn.  Status post maze procedure on 5/22.  Underwent multiple cardioversions.  Currently in normal sinus rhythm.  Continue amiodarone, on Eliquis for anticoagulation.  CKD stage IV: Currently kidney function around baseline.  Monitor volume status.  Monitor BMP.  Nephrology following.  Normocytic anemia: Most likely in the setting of chronic kidney disease.  Iron studies showed low iron.  Given IV iron  Coronary disease: No anginal symptoms at present.  Continue  statin, beta-blocker.  Diabetes type 2: On Trulicity at home.  Continue current insulin regimen.  Monitor blood sugars  Depression/anxiety: On Cymbalta, BuSpar  History of sleep apnea: Not on CPAP.  Seen in the process of getting a new sleep study  Asymptomatic bacteriuria: Urine culture from 6/26 showed E. coli. Urinary symptoms.  Deferred treatment with antibiotics  Morbid obesity: BMI  of 45            DVT prophylaxis: apixaban (ELIQUIS) tablet 5 mg     Code Status: Full Code  Family Communication: None at bedside  Patient status:Inpatient  Patient is from :Home  Anticipated discharge RK:YHCW  Estimated DC date:2-3 days.  Needs cardiology clearance   Consultants: Cardiology, nephrology  Procedures: None  Antimicrobials:  Anti-infectives (From admission, onward)    None       Subjective: Patient seen and examined at the bedside this morning.  Hemodynamically stable.  Comfortable.  On room air.  No new complaints today.  Objective: Vitals:   02/18/22 2020 02/19/22 0510 02/19/22 0517 02/19/22 0937  BP: 99/74 (!) 105/59  105/60  Pulse: 86 86  81  Resp: '19 19  17  '$ Temp: 97.8 F (36.6 C) 98.4 F (36.9 C)    TempSrc: Oral Oral    SpO2: 100% 98%  96%  Weight:   129 kg   Height:        Intake/Output Summary (Last 24 hours) at 02/19/2022 1047 Last data filed at 02/19/2022 0830 Gross per 24 hour  Intake 1353 ml  Output 3575 ml  Net -2222 ml   Filed Weights   02/17/22 0414 02/18/22 0311 02/19/22 0517  Weight: Marland Kitchen)  136.4 kg 131.5 kg 129 kg    Examination:  General exam: Overall comfortable, not in distress, morbidly obese HEENT: PERRL Respiratory system:  no wheezes or crackles  Cardiovascular system: S1 & S2 heard, RRR.  Gastrointestinal system: Abdomen is nondistended, soft and nontender. Central nervous system: Alert and oriented Extremities: Bilateral lower extremity pitting edema/covered with wraps, no clubbing ,no cyanosis Skin: No rashes,  no ulcers,no icterus     Data Reviewed: I have personally reviewed following labs and imaging studies  CBC: Recent Labs  Lab 02/12/22 1226 02/14/22 1303 02/14/22 1304 02/17/22 0252  WBC 5.3  --   --  5.0  HGB 8.1* 9.5* 9.5* 8.4*  HCT 27.3* 28.0* 28.0* 28.1*  MCV 84.8  --   --  83.1  PLT 202  --   --  357   Basic Metabolic Panel: Recent Labs  Lab 02/12/22 1226 02/13/22 0325 02/14/22 0218 02/15/22 0326 02/16/22 0618 02/17/22 0252 02/18/22 0240 02/19/22 0429  NA 141 139   < > 138 139 138 142 140  K 3.9 3.6   < > 3.4* 3.8 4.0 3.8 3.5  CL 105 108   < > 101 103 100 99 98  CO2 25 25   < > '27 27 30 30 31  '$ GLUCOSE 102* 100*   < > 128* 104* 129* 119* 99  BUN 21 20   < > 27* 33* 37* 42* 42*  CREATININE 2.25* 2.40*   < > 2.80* 2.90* 3.01* 3.33* 3.10*  CALCIUM 9.6 8.6*   < > 8.8* 9.0 9.3 9.4 9.3  MG 1.8 1.9  --   --  1.9 2.1  --   --    < > = values in this interval not displayed.     Recent Results (from the past 240 hour(s))  SARS Coronavirus 2 by RT PCR (hospital order, performed in [facility] hospital lab) *cepheid single result test* Anterior Nasal Swab     Status: None   Collection Time: 02/12/22 12:26 PM   Specimen: Anterior Nasal Swab  Result Value Ref Range Status   SARS Coronavirus 2 by RT PCR NEGATIVE NEGATIVE Final    Comment: (NOTE) SARS-CoV-2 target nucleic acids are NOT DETECTED.  The SARS-CoV-2 RNA is generally detectable in upper and lower respiratory specimens during the acute phase of infection. The lowest concentration of SARS-CoV-2 viral copies this assay can detect is 250 copies / mL. A negative result does not preclude SARS-CoV-2 infection and should not be used as the sole basis for treatment or other patient management decisions.  A negative result may occur with improper specimen collection / handling, submission of specimen other than nasopharyngeal swab, presence of viral mutation(s) within the areas targeted by this assay, and inadequate  number of viral copies (<250 copies / mL). A negative result must be combined with clinical observations, patient history, and epidemiological information.  Fact Sheet for Patients:   https://www.patel.info/  Fact Sheet for Healthcare Providers: https://hall.com/  This test is not yet approved or  cleared by the [place] FDA and has been authorized for detection and/or diagnosis of SARS-CoV-2 by FDA under an Emergency Use Authorization (EUA).  This EUA will remain in effect (meaning this test can be used) for the duration of the COVID-19 declaration under Section 564(b)(1) of the Act, 21 U.S.C. section 360bbb-3(b)(1), unless the authorization is terminated or revoked sooner.  Performed at [facility], [address], [place], [place] [address]   Urine Culture  Status: Abnormal   Collection Time: 02/13/22  8:09 AM   Specimen: Urine, Clean Catch  Result Value Ref Range Status   Specimen Description URINE, CLEAN CATCH  Final   Special Requests   Final    NONE Performed at [facility], [address] [place], [place] [address]    Culture (A)  Final    >=100,000 COLONIES/mL ESCHERICHIA COLI 60,000 COLONIES/mL ENTEROBACTER CLOACAE    Report Status 02/15/2022 FINAL  Final   Organism ID, Bacteria ESCHERICHIA COLI (A)  Final   Organism ID, Bacteria ENTEROBACTER CLOACAE (A)  Final      Susceptibility   Enterobacter cloacae - MIC*    CEFAZOLIN >=64 RESISTANT Resistant     CEFEPIME 2 SENSITIVE Sensitive     CIPROFLOXACIN <=0.25 SENSITIVE Sensitive     GENTAMICIN <=1 SENSITIVE Sensitive     IMIPENEM 0.5 SENSITIVE Sensitive     NITROFURANTOIN 32 SENSITIVE Sensitive     TRIMETH/SULFA <=20 SENSITIVE Sensitive     PIP/TAZO >=128 RESISTANT Resistant     * 60,000 COLONIES/mL ENTEROBACTER CLOACAE   Escherichia coli - MIC*    AMPICILLIN 4 SENSITIVE Sensitive     CEFAZOLIN <=4 SENSITIVE Sensitive     CEFEPIME  <=0.12 SENSITIVE Sensitive     CEFTRIAXONE <=0.25 SENSITIVE Sensitive     CIPROFLOXACIN <=0.25 SENSITIVE Sensitive     GENTAMICIN <=1 SENSITIVE Sensitive     IMIPENEM <=0.25 SENSITIVE Sensitive     NITROFURANTOIN 32 SENSITIVE Sensitive     TRIMETH/SULFA <=20 SENSITIVE Sensitive     AMPICILLIN/SULBACTAM <=2 SENSITIVE Sensitive     PIP/TAZO <=4 SENSITIVE Sensitive     * >=100,000 COLONIES/mL ESCHERICHIA COLI     Radiology Studies: No results found.  Scheduled Meds:  amiodarone  200 mg Oral Daily   apixaban  5 mg Oral BID   busPIRone  5 mg Oral BID   colchicine  0.3 mg Oral Daily   cycloSPORINE  1 drop Both Eyes BID   docusate sodium  100 mg Oral BID   DULoxetine  60 mg Oral Daily   fluticasone  2 spray Each Nare Daily   insulin aspart  0-9 Units Subcutaneous TID WC   loratadine  10 mg Oral Daily   metoprolol succinate  25 mg Oral Daily   multivitamin with minerals   Oral Daily   pantoprazole  40 mg Oral Daily   polyethylene glycol  17 g Oral Daily   potassium chloride SA  60 mEq Oral BID   rosuvastatin  10 mg Oral QPM   senna-docusate  1 tablet Oral BID   sodium chloride flush  3 mL Intravenous Q12H   sodium chloride flush  3 mL Intravenous Q12H   sodium chloride flush  3 mL Intravenous Q12H   Continuous Infusions:  sodium chloride     sodium chloride     furosemide 160 mg (02/19/22 0951)     LOS: 7 days   Shelly Coss, MD [facility] P7/09/2021, 10:47 AM

## 2022-02-19 NOTE — Progress Notes (Signed)
Patient ID: [name], female   DOB: [birth year], 66 y.o.   MRN: [id number]     Advanced Heart Failure Rounding Note  PCP-Cardiologist: Buford Dresser, MD   Subjective:    RHC 06/27 Markedly elevated biventricular filling pressures, PA sat low but fick CO preserved Findings:   RA = 27  RV = 62/25 PA = 60/27 (41) PCW = 25 (v = 35) Fick cardiac output/index = 6.8/2.8 PVR = 2.4 WU Ao sat = 90% PA sat = 45%, 49% PAPi = 1.2  Good diuresis again yesterday with IV lasix 160 mg bid.  Weight trending down.    Creatinine 3.01 => 3.3 => 3.1.   SR with PACs and PVCs on telemetry  No dyspnea at rest or around her room.    Objective:   Weight Range: 129 kg Body mass index is 44.54 kg/m.   Vital Signs:   Temp:  [97.7 F (36.5 C)-98.4 F (36.9 C)] 98.4 F (36.9 C) (07/02 0510) Pulse Rate:  [85-86] 86 (07/02 0510) Resp:  [16-19] 19 (07/02 0510) BP: (99-121)/(59-87) 105/59 (07/02 0510) SpO2:  [97 %-100 %] 98 % (07/02 0510) Weight:  [129 kg] 129 kg (07/02 0517) Last BM Date : 02/18/22  Weight change: Filed Weights   02/17/22 0414 02/18/22 0311 02/19/22 0517  Weight: (!) 136.4 kg 131.5 kg 129 kg    Intake/Output:   Intake/Output Summary (Last 24 hours) at 02/19/2022 0853 Last data filed at 02/19/2022 0830 Gross per 24 hour  Intake 1953 ml  Output 3576 ml  Net -1623 ml      Physical Exam    General: NAD Neck: JVP 16 cm, no thyromegaly or thyroid nodule.  Lungs: Clear to auscultation bilaterally with normal respiratory effort. CV: Nondisplaced PMI.  Heart regular S1/S2, no S3/S4, 3/6 HSM apex.  1+ ankle edema.   Abdomen: Soft, nontender, no hepatosplenomegaly, no distention.  Skin: Intact without lesions or rashes.  Neurologic: Alert and oriented x 3.  Psych: Normal affect. Extremities: No clubbing or cyanosis.  HEENT: Normal.   Telemetry   NSR with PACs (personally reviewed)  EKG    N/A   Labs    CBC Recent Labs    02/17/22 0252  WBC 5.0   HGB 8.4*  HCT 28.1*  MCV 83.1  PLT 427   Basic Metabolic Panel Recent Labs    02/17/22 0252 02/18/22 0240 02/19/22 0429  NA 138 142 140  K 4.0 3.8 3.5  CL 100 99 98  CO2 '30 30 31  '$ GLUCOSE 129* 119* 99  BUN 37* 42* 42*  CREATININE 3.01* 3.33* 3.10*  CALCIUM 9.3 9.4 9.3  MG 2.1  --   --    Liver Function Tests No results for input(s): "AST", "ALT", "ALKPHOS", "BILITOT", "PROT", "ALBUMIN" in the last 72 hours. No results for input(s): "LIPASE", "AMYLASE" in the last 72 hours. Cardiac Enzymes No results for input(s): "CKTOTAL", "CKMB", "CKMBINDEX", "TROPONINI" in the last 72 hours.  BNP: BNP (last 3 results) Recent Labs    01/26/22 1156 02/06/22 1809 02/12/22 1226  BNP 375.6* 550.8* 517.3*    ProBNP (last 3 results) No results for input(s): "PROBNP" in the last 8760 hours.   D-Dimer No results for input(s): "DDIMER" in the last 72 hours. Hemoglobin A1C No results for input(s): "HGBA1C" in the last 72 hours. Fasting Lipid Panel No results for input(s): "CHOL", "HDL", "LDLCALC", "TRIG", "CHOLHDL", "LDLDIRECT" in the last 72 hours. Thyroid Function Tests No results for input(s): "TSH", "T4TOTAL", "T3FREE", "THYROIDAB"  in the last 72 hours.  Invalid input(s): "FREET3"  Other results:   Imaging    No results found.   Medications:     Scheduled Medications:  amiodarone  200 mg Oral Daily   apixaban  5 mg Oral BID   busPIRone  5 mg Oral BID   colchicine  0.3 mg Oral Daily   cycloSPORINE  1 drop Both Eyes BID   docusate sodium  100 mg Oral BID   DULoxetine  60 mg Oral Daily   fluticasone  2 spray Each Nare Daily   insulin aspart  0-9 Units Subcutaneous TID WC   loratadine  10 mg Oral Daily   metoprolol succinate  25 mg Oral Daily   multivitamin with minerals   Oral Daily   pantoprazole  40 mg Oral Daily   polyethylene glycol  17 g Oral Daily   potassium chloride SA  60 mEq Oral Daily   rosuvastatin  10 mg Oral QPM   senna-docusate  1 tablet Oral  BID   sodium chloride flush  3 mL Intravenous Q12H   sodium chloride flush  3 mL Intravenous Q12H   sodium chloride flush  3 mL Intravenous Q12H    Infusions:  sodium chloride     sodium chloride     furosemide 160 mg (02/18/22 1656)    PRN Medications: sodium chloride, sodium chloride, acetaminophen, albuterol, nitroGLYCERIN, ondansetron (ZOFRAN) IV, oxyCODONE, sodium chloride flush, sodium chloride flush    Patient Profile   66 y.o. female with history of chronic biventricular systolic CHF, PAF/AFL, CKD IV, CAD, valvular heart disease, DMII, OSA. Multiple admissions for decompensated HF mostly in setting of recurrent AF.    Now presenting with a/c CHF.   Assessment/Plan     1. Acute on chronic Biventricular Systolic Heart Failure: - RHC (6/22) with low filling pressures with moderately reduced CO. - Echo (7/22) in setting of AFL with RVR, showed EF 30-35%, moderate LVH, moderate RV dysfunction, PASP 48, s/p MV repair with mild MR and mean gradient 10 mmHg with HR around 130, s/p TV repair with mild-moderate TR, dilated IVC.   - RHC 4/23 markedly elevated biventricular pressures - Echo 4/23 35-40% RV moderately down severe TR. Moderate MS mean grad 6m HG - TEE 06/02: Severe biventricular dysfunction, trivial MR, mean gradient 3 across MV prosthesis, severe TR - Multiple admissions recently for HF mostly occur in setting of recurrent AF. Currently in NSR. - RHC 06/27 markedly elevated biventricular pressures, low PAPi, PA sats low but preserved Fick CO. - NYHA IIIb/IV. Diuresing well but still seems volume overloaded.  She did well yesterday in terms of diuresis with Lasix 160 mg IV bid, will repeat today and give 1 dose of metolazone 2.5 mg.  Creatinine lower today at 3.1.  - In and out of AFL at recent visits. Maintaining SR this admit. - UNNA boots - GDMT limited by CKD.  - No Bidil for now.  Avoid hypotension w/ CKD  - Continue toprol xl 25 mg daily  - Worry may have  end-stage disease. Still very volume overloaded, continue diuresis as above.  I worry that she may end up with dialysis.  - Repeat RHC prior to discharge once she has been diuresed further => set for Monday. Discussed risks/benefits and she agrees to procedure.  - Avoid milrinone with propensity for AF.    2. PAF/AFL - She has failed Tikosyn. - She had Maze in 5/22 and DCCV in 6/22 & 04/19/21, last of  which she had ERAF. - Multiple admissions recently for HF mostly occur in setting of recurrent AF/AFL. Last DCCV 06/13.  - Currently maintaining SR - Continue amiodarone + Eliquis  - Unable to tolerate AF at all. Follows with EP. Not a candidate for Afib ablation or AV nodal ablation/PPM - Has OSA, awaiting split night study for CPAP titration.  - Keep K > 4.0 and Mg > 2.0 (replete today).    3. CKD Stage IV   - Nephrology consulted.  - Baseline Scr likely 3-3.5, recent Scr 2-2.5 likely associated with dilution in setting of volume overload. - Scr 3.0 => 3.3 => 3.1 today. - Continue with aggressive diuresis. I worry that she will eventually need HD for volume management.  - No SGLT2i, GFR has been consistently less than 20   4. CAD - Cath (2019): Mild nonobstructive CAD - No chest pain.  - Continue statin + beta blocker - no ASA w/ Eliquis    5. Valvular Heart Disease: - S/p MV and TV repair in 5/22.  - Echo 02/25/21 with mild-moderate TR.   - Echo 4/23 35-40% RV moderately down. severe TR. Moderate MS mean gradient 79mHG  - TEE 06/23: severe biventricular dysfunction, trivial MR, mean gradient 3 across MV prosthesis, severe TR - Seen by structural heart team. Has been turned down for TV valve in ring due to markedly elevated wedge pressures despite diuresis.   6. Obesity - Body mass index is 48 - Discussed portion control.    7. DMII - Hgb A1c 6.7% 04/23 - GFR has been too low for SGLT2i   8. OSA - initial sleep study positive but was awaiting split night study to titrate  CPAP - Has been referred to Dr. TRadford Pax- suspect this is part of her AF recurrence   9. PVCs - About 10/min on tele this am. - Continue PO amiodarone  - Keep K > 4 and Mag > 2  10. Bacteriuria - Urine culture grew E.Coli and Enterobacter cloacae - Not currently treated d/t lack of symptoms  11. Anemia - Hgb 8s-9s - Iron stores low - Feraheme given.   Continue to mobilize.  PT/OT.   Length of Stay: 7  DLoralie Champagne MD  02/19/2022, 8:53 AM  Advanced Heart Failure Team Pager [phone](M-F; 7a - 5p)  Please contact [email or web address] for night-coverage after hours (5p -7a ) and weekends on [email or web address]

## 2022-02-19 NOTE — Progress Notes (Signed)
Mobility Specialist Progress Note    02/19/22 1424  Mobility  Activity Ambulated with assistance in hallway  Level of Assistance Contact guard assist, steadying assist  Assistive Device Front wheel walker  Distance Ambulated (ft) 180 ft (120+20+40)  Activity Response Tolerated fair  $Mobility charge 1 Mobility   Pre-Mobility: 81 HR, 90% SpO2 During Mobility: 90 HR, 95% SpO2 Post-Mobility: 85 HR  Pt received in chair and agreeable. C/o fatigue. Took x1 standing rest break and x1 extended seated rest break. Returned to bed with call bell in reach.    Hildred Alamin Mobility Specialist

## 2022-02-19 NOTE — Progress Notes (Signed)
Nephrology Follow-Up Consult note   Assessment/Recommendations: [name] is a/an 65 y.o. female with a past medical history significant for systolic congestive heart failure, valvular disease, atrial fibrillation, hypertension, diabetes, chronic kidney disease 4 who present w/ heart failure exacerbation    Systolic Heart Failure Exacerbation: Multiple hospitalizations for volume overload.  Some of them associated with atrial fibrillation.  Right heart cath with persistent elevated filling pressures.  Diuresing well at this time.  Weight has decreased by about 12 kg since admission -Creatinine is remaining stable despite diuresis -Continue with aggressive diuresis with IV Lasix today -Would pay less attention to creatinine and more focus on volume optimization -If creatinine worsens significantly with volume removal we can consider dialysis at that time.  Maintenance of euvolemia takes precedent over creatinine   CKD 4: It is likely her baseline is closer to 3-3.5 and that her creatinines of 2-2.5 are associated with dilution in the setting of volume overload. -continue with diuresis as above; creatinine has risen since admission but is currently in her previous baseline.  Would not be surprised if creatinine goes higher as we optimize her volume status but this takes precedent. -Continue to monitor daily Cr, Dose meds for GFR -Monitor Daily I/Os, Daily weight  -Maintain MAP>65 for optimal renal perfusion.  -Avoid nephrotoxic medications including NSAIDs -Use synthetic opioids (Fentanyl/Dilaudid) if needed -Currently no indication for HD   Hyperlipidemia: On statin   Atrial fibrillation: Rate appropriate at this time on metoprolol   Iron deficiency anemia: CKD may play some role.  Iron saturation 6 and ferritin 38.  IV iron ordered for a total of 1 g   Uncontrolled Diabetes Mellitus Type 2 with Hyperglycemia: Management per primary team   Recommendations conveyed to primary service.     [facility] 02/19/2022 7:59 AM  ___________________________________________________________  CC: Heart failure exacerbation  Interval History/Subjective: Patient feels better this morning.  Headache is gone.  Denies any other complaints.   Medications:  Current Facility-Administered Medications  Medication Dose Route Frequency Provider Last Rate Last Admin   0.9 %  sodium chloride infusion  250 mL Intravenous PRN Bensimhon, Shaune Pascal, MD       0.9 %  sodium chloride infusion  250 mL Intravenous PRN Bensimhon, Shaune Pascal, MD       acetaminophen (TYLENOL) tablet 650 mg  650 mg Oral Q4H PRN Bensimhon, Shaune Pascal, MD       albuterol (PROVENTIL) (2.5 MG/3ML) 0.083% nebulizer solution 2.5 mg  2.5 mg Nebulization Q6H PRN Bensimhon, Shaune Pascal, MD       amiodarone (PACERONE) tablet 200 mg  200 mg Oral Daily Bensimhon, Shaune Pascal, MD   200 mg at 02/18/22 0910   apixaban (ELIQUIS) tablet 5 mg  5 mg Oral BID Bensimhon, Shaune Pascal, MD   5 mg at 02/18/22 2149   busPIRone (BUSPAR) tablet 5 mg  5 mg Oral BID Bensimhon, Shaune Pascal, MD   5 mg at 02/18/22 2149   colchicine tablet 0.3 mg  0.3 mg Oral Daily Bensimhon, Shaune Pascal, MD   0.3 mg at 02/18/22 0913   cycloSPORINE (RESTASIS) 0.05 % ophthalmic emulsion 1 drop  1 drop Both Eyes BID Bensimhon, Shaune Pascal, MD   1 drop at 02/18/22 2155   docusate sodium (COLACE) capsule 100 mg  100 mg Oral BID Bensimhon, Shaune Pascal, MD   100 mg at 02/18/22 2149   DULoxetine (CYMBALTA) DR capsule 60 mg  60 mg Oral Daily Bensimhon, Quillian Quince  R, MD   60 mg at 02/18/22 0908   fluticasone (FLONASE) 50 MCG/ACT nasal spray 2 spray  2 spray Each Nare Daily Bensimhon, Shaune Pascal, MD   2 spray at 02/18/22 0910   furosemide (LASIX) 160 mg in dextrose 5 % 50 mL IVPB  160 mg Intravenous BID Bensimhon, Shaune Pascal, MD 66 mL/hr at 02/18/22 1656 160 mg at 02/18/22 1656   insulin aspart (novoLOG) injection 0-9 Units  0-9 Units Subcutaneous TID WC Bensimhon, Shaune Pascal, MD   1  Units at 02/18/22 1238   loratadine (CLARITIN) tablet 10 mg  10 mg Oral Daily Bensimhon, Shaune Pascal, MD   10 mg at 02/18/22 0908   metoprolol succinate (TOPROL-XL) 24 hr tablet 25 mg  25 mg Oral Daily Bensimhon, Shaune Pascal, MD   25 mg at 02/18/22 0908   multivitamin with minerals tablet   Oral Daily Bensimhon, Shaune Pascal, MD   1 tablet at 02/18/22 0909   nitroGLYCERIN (NITROSTAT) SL tablet 0.4 mg  0.4 mg Sublingual Q5 Min x 3 PRN Bensimhon, Shaune Pascal, MD       ondansetron [facility]) injection 4 mg  4 mg Intravenous Q6H PRN Bensimhon, Shaune Pascal, MD       oxyCODONE (Oxy IR/ROXICODONE) immediate release tablet 5 mg  5 mg Oral Q6H PRN Bensimhon, Shaune Pascal, MD   5 mg at 02/16/22 2229   pantoprazole (PROTONIX) EC tablet 40 mg  40 mg Oral Daily Bensimhon, Shaune Pascal, MD   40 mg at 02/18/22 0908   polyethylene glycol (MIRALAX / GLYCOLAX) packet 17 g  17 g Oral Daily Joette Catching, [place]   17 g at 02/18/22 1937   potassium chloride SA (KLOR-CON M) CR tablet 60 mEq  60 mEq Oral Daily Bensimhon, Shaune Pascal, MD   60 mEq at 02/18/22 0908   rosuvastatin (CRESTOR) tablet 10 mg  10 mg Oral QPM Bensimhon, Shaune Pascal, MD   10 mg at 02/18/22 1653   senna-docusate (Senokot-S) tablet 1 tablet  1 tablet Oral BID Joette Catching, PA-C   1 tablet at 02/18/22 2149   sodium chloride flush (NS) 0.9 % injection 3 mL  3 mL Intravenous Q12H Bensimhon, Shaune Pascal, MD   3 mL at 02/18/22 2200   sodium chloride flush (NS) 0.9 % injection 3 mL  3 mL Intravenous PRN Bensimhon, Shaune Pascal, MD   3 mL at 02/12/22 2106   sodium chloride flush (NS) 0.9 % injection 3 mL  3 mL Intravenous Q12H Bensimhon, Shaune Pascal, MD   3 mL at 02/18/22 2200   sodium chloride flush (NS) 0.9 % injection 3 mL  3 mL Intravenous Q12H Bensimhon, Shaune Pascal, MD   3 mL at 02/18/22 2159   sodium chloride flush (NS) 0.9 % injection 3 mL  3 mL Intravenous PRN Bensimhon, Shaune Pascal, MD          Review of Systems: 10 systems reviewed and negative except per interval  history/subjective  Physical Exam: Vitals:   02/18/22 2020 02/19/22 0510  BP: 99/74 (!) 105/59  Pulse: 86 86  Resp: 19 19  Temp: 97.8 F (36.6 C) 98.4 F (36.9 C)  SpO2: 100% 98%   No intake/output data recorded.  Intake/Output Summary (Last 24 hours) at 02/19/2022 0759 Last data filed at 02/19/2022 0526 Gross per 24 hour  Intake 1953 ml  Output 3401 ml  Net -1448 ml   Constitutional: well-appearing, no acute distress ENMT: ears and nose without scars or lesions, MMM  CV: normal rate, 1+ pitting edema in the bilateral lower extremities, wrapped Respiratory: Bilateral chest rise, normal work of breathing Gastrointestinal: soft, non-tender, no palpable masses or hernias Skin: no visible lesions or rashes Psych: alert, judgement/insight appropriate, appropriate mood and affect   Test Results I personally reviewed new and old clinical labs and radiology tests Lab Results  Component Value Date   NA 140 02/19/2022   K 3.5 02/19/2022   CL 98 02/19/2022   CO2 31 02/19/2022   BUN 42 (H) 02/19/2022   CREATININE 3.10 (H) 02/19/2022   CALCIUM 9.3 02/19/2022   ALBUMIN 3.3 (L) 02/06/2022   PHOS 3.4 11/22/2021    CBC Recent Labs  Lab 02/12/22 1226 02/14/22 1303 02/14/22 1304 02/17/22 0252  WBC 5.3  --   --  5.0  HGB 8.1* 9.5* 9.5* 8.4*  HCT 27.3* 28.0* 28.0* 28.1*  MCV 84.8  --   --  83.1  PLT 202  --   --  224

## 2022-02-20 ENCOUNTER — Ambulatory Visit (HOSPITAL_COMMUNITY): Admit: 2022-02-20 | Payer: 59 | Admitting: Cardiology

## 2022-02-20 DIAGNOSIS — I50813 Acute on chronic right heart failure: Secondary | ICD-10-CM | POA: Diagnosis not present

## 2022-02-20 DIAGNOSIS — I480 Paroxysmal atrial fibrillation: Secondary | ICD-10-CM | POA: Diagnosis not present

## 2022-02-20 LAB — BASIC METABOLIC PANEL
Anion gap: 6 (ref 5–15)
BUN: 44 mg/dL — ABNORMAL HIGH (ref 8–23)
CO2: 34 mmol/L — ABNORMAL HIGH (ref 22–32)
Calcium: 9.3 mg/dL (ref 8.9–10.3)
Chloride: 100 mmol/L (ref 98–111)
Creatinine, Ser: 3.29 mg/dL — ABNORMAL HIGH (ref 0.44–1.00)
GFR, Estimated: 15 mL/min — ABNORMAL LOW (ref >60)
Glucose, Bld: 90 mg/dL (ref 70–99)
Potassium: 4.1 mmol/L (ref 3.5–5.1)
Sodium: 140 mmol/L (ref 135–145)

## 2022-02-20 LAB — CBC
HCT: 29 % — ABNORMAL LOW (ref 36.0–46.0)
Hemoglobin: 8.9 g/dL — ABNORMAL LOW (ref 12.0–15.0)
MCH: 25.7 pg — ABNORMAL LOW (ref 26.0–34.0)
MCHC: 30.7 g/dL (ref 30.0–36.0)
MCV: 83.8 fL (ref 80.0–100.0)
Platelets: 217 10*3/uL (ref 150–400)
RBC: 3.46 MIL/uL — ABNORMAL LOW (ref 3.87–5.11)
RDW: 15.9 % — ABNORMAL HIGH (ref 11.5–15.5)
WBC: 5.1 10*3/uL (ref 4.0–10.5)
nRBC: 0 % (ref 0.0–0.2)

## 2022-02-20 LAB — GLUCOSE, CAPILLARY
Glucose-Capillary: 121 mg/dL — ABNORMAL HIGH (ref 70–99)
Glucose-Capillary: 122 mg/dL — ABNORMAL HIGH (ref 70–99)
Glucose-Capillary: 173 mg/dL — ABNORMAL HIGH (ref 70–99)
Glucose-Capillary: 115 mg/dL — ABNORMAL HIGH (ref 70–99)

## 2022-02-20 MED ORDER — METOLAZONE 2.5 MG PO TABS
2.5000 mg | ORAL_TABLET | Freq: Once | ORAL | Status: AC
  Administered 2022-02-20: 2.5 mg via ORAL
  Filled 2022-02-20: qty 1

## 2022-02-20 NOTE — Progress Notes (Addendum)
Patient ID: [name], female   DOB: 17-Apr-1956, 66 y.o.   MRN: [id number]     Advanced Heart Failure Rounding Note  PCP-Cardiologist: Buford Dresser, MD   Subjective:    RHC 06/27 Markedly elevated biventricular filling pressures, PA sat low but fick CO preserved Findings:   RA = 27  RV = 62/25 PA = 60/27 (41) PCW = 25 (v = 35) Fick cardiac output/index = 6.8/2.8 PVR = 2.4 WU Ao sat = 90% PA sat = 45%, 49% PAPi = 1.2  Continues to diurese with IV lasix 160 BID + 2.5 mg metolazone. 3.7L UOP yesterday. Weight down another 5 lb.   Creatinine 3.01 => 3.3 => 3.1. Awaiting labs this am.  Feels okay. Dyspnea improving but still has some abdominal bloating. Ambulating with walker.   Objective:   Weight Range: 126.6 kg Body mass index is 43.7 kg/m.   Vital Signs:   Temp:  [97.8 F (36.6 C)-98.5 F (36.9 C)] 97.8 F (36.6 C) (07/03 0431) Pulse Rate:  [81-86] 85 (07/03 0431) Resp:  [11-29] 16 (07/03 0431) BP: (105-111)/(56-60) 111/57 (07/03 0431) SpO2:  [80 %-100 %] 95 % (07/03 0431) Weight:  [126.6 kg] 126.6 kg (07/03 0437) Last BM Date : 02/19/22  Weight change: Filed Weights   02/18/22 0311 02/19/22 0517 02/20/22 0437  Weight: 131.5 kg 129 kg 126.6 kg    Intake/Output:   Intake/Output Summary (Last 24 hours) at 02/20/2022 0737 Last data filed at 02/20/2022 0500 Gross per 24 hour  Intake 610 ml  Output 3675 ml  Net -3065 ml      Physical Exam    General:  No distress. Lying comfortably in bed.  HEENT: normal Neck: supple. JVP to ear. Carotids 2+ bilat; no bruits.  Cor: PMI nondisplaced. Regular rate & rhythm. No rubs, gallops, 3/6 HSM best heard at apex Lungs: clear Abdomen: obese, soft, nontender, nondistended.  Extremities: no cyanosis, clubbing, rash, trace edema, + UNNA Neuro: alert & orientedx3, cranial nerves grossly intact. moves all 4 extremities w/o difficulty. Affect pleasant   Telemetry   NSR with PVCs (< 5 min)  EKG    N/A    Labs    CBC No results for input(s): "WBC", "NEUTROABS", "HGB", "HCT", "MCV", "PLT" in the last 72 hours.  Basic Metabolic Panel Recent Labs    02/18/22 0240 02/19/22 0429  NA 142 140  K 3.8 3.5  CL 99 98  CO2 30 31  GLUCOSE 119* 99  BUN 42* 42*  CREATININE 3.33* 3.10*  CALCIUM 9.4 9.3   Liver Function Tests No results for input(s): "AST", "ALT", "ALKPHOS", "BILITOT", "PROT", "ALBUMIN" in the last 72 hours. No results for input(s): "LIPASE", "AMYLASE" in the last 72 hours. Cardiac Enzymes No results for input(s): "CKTOTAL", "CKMB", "CKMBINDEX", "TROPONINI" in the last 72 hours.  BNP: BNP (last 3 results) Recent Labs    01/26/22 1156 02/06/22 1809 02/12/22 1226  BNP 375.6* 550.8* 517.3*    ProBNP (last 3 results) No results for input(s): "PROBNP" in the last 8760 hours.   D-Dimer No results for input(s): "DDIMER" in the last 72 hours. Hemoglobin A1C No results for input(s): "HGBA1C" in the last 72 hours. Fasting Lipid Panel No results for input(s): "CHOL", "HDL", "LDLCALC", "TRIG", "CHOLHDL", "LDLDIRECT" in the last 72 hours. Thyroid Function Tests No results for input(s): "TSH", "T4TOTAL", "T3FREE", "THYROIDAB" in the last 72 hours.  Invalid input(s): "FREET3"  Other results:   Imaging    No results found.   Medications:  Scheduled Medications:  amiodarone  200 mg Oral Daily   apixaban  5 mg Oral BID   busPIRone  5 mg Oral BID   colchicine  0.3 mg Oral Daily   cycloSPORINE  1 drop Both Eyes BID   docusate sodium  100 mg Oral BID   DULoxetine  60 mg Oral Daily   fluticasone  2 spray Each Nare Daily   insulin aspart  0-9 Units Subcutaneous TID WC   loratadine  10 mg Oral Daily   metoprolol succinate  25 mg Oral Daily   multivitamin with minerals   Oral Daily   pantoprazole  40 mg Oral Daily   polyethylene glycol  17 g Oral Daily   potassium chloride SA  60 mEq Oral BID   rosuvastatin  10 mg Oral QPM   senna-docusate  1 tablet Oral  BID   sodium chloride flush  3 mL Intravenous Q12H   sodium chloride flush  3 mL Intravenous Q12H   sodium chloride flush  3 mL Intravenous Q12H   sodium chloride flush  3 mL Intravenous Q12H    Infusions:  sodium chloride     sodium chloride     sodium chloride     sodium chloride 10 mL/hr at 02/20/22 0446   furosemide 160 mg (02/19/22 1649)    PRN Medications: sodium chloride, sodium chloride, sodium chloride, acetaminophen, albuterol, nitroGLYCERIN, ondansetron (ZOFRAN) IV, oxyCODONE, sodium chloride flush, sodium chloride flush, sodium chloride flush    Patient Profile   66 y.o. female with history of chronic biventricular systolic CHF, PAF/AFL, CKD IV, CAD, valvular heart disease, DMII, OSA. Multiple admissions for decompensated HF mostly in setting of recurrent AF.    Now presenting with a/c CHF.   Assessment/Plan     1. Acute on chronic Biventricular Systolic Heart Failure: - RHC (6/22) with low filling pressures with moderately reduced CO. - Echo (7/22) in setting of AFL with RVR, showed EF 30-35%, moderate LVH, moderate RV dysfunction, PASP 48, s/p MV repair with mild MR and mean gradient 10 mmHg with HR around 130, s/p TV repair with mild-moderate TR, dilated IVC.   - RHC 4/23 markedly elevated biventricular pressures - Echo 4/23 35-40% RV moderately down severe TR. Moderate MS mean grad 64m HG - TEE 06/02: Severe biventricular dysfunction, trivial MR, mean gradient 3 across MV prosthesis, severe TR - Multiple admissions recently for HF mostly occur in setting of recurrent AF. Currently in NSR. - RHC 06/27 markedly elevated biventricular pressures, low PAPi, PA sats low but preserved Fick CO. - NYHA IIIb/IV. Diuresing well but still seems volume overloaded.  Diuresing well with IV lasix 160 BID + 2.5 mg metolazone. Repeat again today. Scr 3.1 yesterday. Today's labs pending. - In and out of AFL at recent visits. Maintaining SR this admit. - UNNA boots - GDMT limited  by CKD.  - No Bidil for now.  Avoid hypotension w/ CKD  - Continue toprol xl 25 mg daily  - Worry may have end-stage disease. Still very volume overloaded, continue diuresis as above.  I worry that she may end up with dialysis.  - Repeat RHC prior to discharge once she has been diuresed further => set for today, will postpone to Wednesday, 07/05. Discussed risks/benefits and she agrees to procedure.  - Avoid milrinone with propensity for AF.    2. PAF/AFL - She has failed Tikosyn. - She had Maze in 5/22 and DCCV in 6/22 & 04/19/21, last of which she had ERAF. -  Multiple admissions recently for HF mostly occur in setting of recurrent AF/AFL. Last DCCV 06/13.  - Currently maintaining SR - Continue amiodarone + Eliquis  - Unable to tolerate AF at all. Follows with EP. Not a candidate for Afib ablation or AV nodal ablation/PPM - Has OSA, awaiting split night study for CPAP titration.  - Keep K > 4.0 and Mg > 2.0 (replete today).    3. CKD Stage IV   - Nephrology consulted.  - Baseline Scr likely 3-3.5, recent Scr 2-2.5 likely associated with dilution in setting of volume overload. - Scr 3.0 => 3.3 => 3.1 => today's labs pending - Continue with aggressive diuresis. I worry that she will eventually need HD for volume management.  - No SGLT2i, GFR has been consistently less than 20   4. CAD - Cath (2019): Mild nonobstructive CAD - No chest pain.  - Continue statin + beta blocker - no ASA w/ Eliquis    5. Valvular Heart Disease: - S/p MV and TV repair in 5/22.  - Echo 02/25/21 with mild-moderate TR.   - Echo 4/23 35-40% RV moderately down. severe TR. Moderate MS mean gradient 64mHG  - TEE 06/23: severe biventricular dysfunction, trivial MR, mean gradient 3 across MV prosthesis, severe TR - Seen by structural heart team. Has been turned down for TV valve in ring due to markedly elevated wedge pressures despite diuresis.   6. Obesity - Body mass index is 48 - Discussed portion control.     7. DMII - Hgb A1c 6.7% 04/23 - GFR has been too low for SGLT2i   8. OSA - initial sleep study positive but was awaiting split night study to titrate CPAP - Has been referred to Dr. TRadford Pax- suspect this is part of her AF recurrence   9. PVCs - Burden improved on tele this am - Continue PO amiodarone  - Keep K > 4 and Mag > 2  10. Bacteriuria - Urine culture grew E.Coli and Enterobacter cloacae - Not currently treated d/t lack of symptoms  11. Anemia - Hgb 8s-9s - Iron stores low - Feraheme given.   Continue to mobilize.  PT/OT.   Length of Stay: 8  FINCH, LINDSAY N, PA-C  02/20/2022, 7:37 AM  Advanced Heart Failure Team Pager [phone](M-F; 7a - 5p)  Please contact [facility] for night-coverage after hours (5p -7a ) and weekends on [email or web address]  Patient seen with PA, agree with the above note.   She diuresed yesterday, weight down 5 lbs.  No labs yet.  Remains in NSR.   General: NAD Neck: JVP 16 cm, no thyromegaly or thyroid nodule.  Lungs: Clear to auscultation bilaterally with normal respiratory effort. CV: Nondisplaced PMI.  Heart regular S1/S2, no S3/S4, no murmur.  1+ edema to knees.  Abdomen: Soft, nontender, no hepatosplenomegaly, no distention.  Skin: Intact without lesions or rashes.  Neurologic: Alert and oriented x 3.  Psych: Normal affect. Extremities: No clubbing or cyanosis.  HEENT: Normal.   Patient continues to diurese well, weight coming down.  No labs yet. She still looks significantly volume overloaded.  - Would diurese for a couple more days if renal function allows (labs pending today).  Will give Lasix 160 mg IV bid with metolazone 2.5 x 1.  - Hold off on RHC until better diuresed, plan Thursday.   Continue apixaban and po amiodarone for rhythm control.  She is in NSR.   DLoralie Champagne7/10/2021 8:26 AM

## 2022-02-20 NOTE — Progress Notes (Signed)
PROGRESS NOTE  [name]  ZOX:[id number] DOB: 03/19/1956 DOA: 02/12/2022 PCP: [name], NP   Brief Narrative:  Patient is a 66 year old female with history of systolic congestive heart failure, severe right ventricular failure, status post mitral valve repair, severe tricuspid regurgitation, chronic A-fib, CKD stage IV who presented here with orthopnea, PND, weight gain and found to have fluid overload.  Currently on IV diuresis.  Cardiology, nephrology following.  Plan for right heart cath on Wednesday  Assessment & Plan:  Principal Problem:   Acute on chronic right heart failure (HCC) Active Problems:   AF (paroxysmal atrial fibrillation) (HCC)   Essential hypertension   Diabetes mellitus type 2 in obese (HCC)   Type 2 diabetes mellitus with hyperlipidemia ([place])   Depression   Class 3 obesity (HCC)   NICM (nonischemic cardiomyopathy) ([place])   S/P mitral valve repair   Prolonged QT interval   Anxiety and depression   GERD (gastroesophageal reflux disease)  Acute on chronic biventricular systolic heart failure : Echocardiogram on 6/23 showed normal ejection fraction.  Right heart cath on 6/27 showed elevated biventricular pressure.  Presented with fluid overload, orthopnea, PND.  Currently on Lasix 160 mg twice a day along with metolazone.  Being followed by advanced heart failure team.  Continue metoprolol.  Monitor BMP.  Having good diuresis.  Remains on room air. Cardiology planning for repeat right heart cath on Wednesday  Paroxysmal A-fib: Failed Tikosyn.  Status post maze procedure on 5/22.  Underwent multiple cardioversions.  Currently in normal sinus rhythm.  Continue amiodarone, on Eliquis for anticoagulation.  CKD stage IV: Currently kidney function around baseline.  Monitor volume status.  Monitor BMP.  Nephrology following.  Normocytic anemia: Most likely in the setting of chronic kidney disease.  Iron studies showed low iron.  Given IV iron  Coronary  disease: No anginal symptoms at present.  Continue statin, beta-blocker.  Diabetes type 2: On Trulicity at home.  Continue current insulin regimen.  Monitor blood sugars  Depression/anxiety: On Cymbalta, BuSpar  History of sleep apnea: Not on CPAP.  Seen in the process of getting a new sleep study  Asymptomatic bacteriuria: Urine culture from 6/26 showed E. coli. Urinary symptoms.  Deferred treatment   Morbid obesity: BMI  of 45            DVT prophylaxis: apixaban (ELIQUIS) tablet 5 mg     Code Status: Full Code  Family Communication: None at bedside  Patient status:Inpatient  Patient is from :Home  Anticipated discharge WJ:XBJY  Estimated DC date:2-3 days.  Needs cardiology clearance   Consultants: Cardiology, nephrology  Procedures: None  Antimicrobials:  Anti-infectives (From admission, onward)    None       Subjective: Patient seen and examined at the bedside this morning.  Right heart cath canceled.  Continues to remain comfortable, on room air.  No new complaints Objective: Vitals:   02/19/22 1945 02/20/22 0431 02/20/22 0437 02/20/22 0800  BP: (!) 106/56 (!) 111/57  (!) 106/57  Pulse: 85 85  80  Resp: (!) '21 16  20  '$ Temp: 98.5 F (36.9 C) 97.8 F (36.6 C)  98 F (36.7 C)  TempSrc: Oral Oral  Oral  SpO2: 96% 95%  96%  Weight:   126.6 kg   Height:        Intake/Output Summary (Last 24 hours) at 02/20/2022 1054 Last data filed at 02/20/2022 0807 Gross per 24 hour  Intake 610 ml  Output 2950 ml  Net -  2340 ml   Filed Weights   02/18/22 0311 02/19/22 0517 02/20/22 0437  Weight: 131.5 kg 129 kg 126.6 kg    Examination:  General exam: Overall comfortable, not in distress,obese HEENT: PERRL Respiratory system:  no wheezes or crackles  Cardiovascular system: S1 & S2 heard, RRR.  Gastrointestinal system: Abdomen is nondistended, soft and nontender. Central nervous system: Alert and oriented Extremities: Bilateral lower extremity pitting  edema, bilateral lower extremities covered with wraps., no clubbing ,no cyanosis Skin: No rashes, no ulcers,no icterus     Data Reviewed: I have personally reviewed following labs and imaging studies  CBC: Recent Labs  Lab 02/14/22 1303 02/14/22 1304 02/17/22 0252 02/20/22 0727  WBC  --   --  5.0 5.1  HGB 9.5* 9.5* 8.4* 8.9*  HCT 28.0* 28.0* 28.1* 29.0*  MCV  --   --  83.1 83.8  PLT  --   --  224 829   Basic Metabolic Panel: Recent Labs  Lab 02/16/22 0618 02/17/22 0252 02/18/22 0240 02/19/22 0429 02/20/22 0727  NA 139 138 142 140 140  K 3.8 4.0 3.8 3.5 4.1  CL 103 100 99 98 100  CO2 '27 30 30 31 '$ 34*  GLUCOSE 104* 129* 119* 99 90  BUN 33* 37* 42* 42* 44*  CREATININE 2.90* 3.01* 3.33* 3.10* 3.29*  CALCIUM 9.0 9.3 9.4 9.3 9.3  MG 1.9 2.1  --   --   --      Recent Results (from the past 240 hour(s))  SARS Coronavirus 2 by RT PCR (hospital order, performed in [facility] hospital lab) *cepheid single result test* Anterior Nasal Swab     Status: None   Collection Time: 02/12/22 12:26 PM   Specimen: Anterior Nasal Swab  Result Value Ref Range Status   SARS Coronavirus 2 by RT PCR NEGATIVE NEGATIVE Final    Comment: (NOTE) SARS-CoV-2 target nucleic acids are NOT DETECTED.  The SARS-CoV-2 RNA is generally detectable in upper and lower respiratory specimens during the acute phase of infection. The lowest concentration of SARS-CoV-2 viral copies this assay can detect is 250 copies / mL. A negative result does not preclude SARS-CoV-2 infection and should not be used as the sole basis for treatment or other patient management decisions.  A negative result may occur with improper specimen collection / handling, submission of specimen other than nasopharyngeal swab, presence of viral mutation(s) within the areas targeted by this assay, and inadequate number of viral copies (<250 copies / mL). A negative result must be combined with clinical observations, patient history, and  epidemiological information.  Fact Sheet for Patients:   https://www.patel.info/  Fact Sheet for Healthcare Providers: https://hall.com/  This test is not yet approved or  cleared by the [place] FDA and has been authorized for detection and/or diagnosis of SARS-CoV-2 by FDA under an Emergency Use Authorization (EUA).  This EUA will remain in effect (meaning this test can be used) for the duration of the COVID-19 declaration under Section 564(b)(1) of the Act, 21 U.S.C. section 360bbb-3(b)(1), unless the authorization is terminated or revoked sooner.  Performed at [facility], [address] [place], [place] [address]   Urine Culture     Status: Abnormal   Collection Time: 02/13/22  8:09 AM   Specimen: Urine, Clean Catch  Result Value Ref Range Status   Specimen Description URINE, CLEAN CATCH  Final   Special Requests   Final    NONE Performed at [facility], [place]  [address], [place], [place] [address]    Culture (A)  Final    >=100,000 COLONIES/mL ESCHERICHIA COLI 60,000 COLONIES/mL ENTEROBACTER CLOACAE    Report Status 02/15/2022 FINAL  Final   Organism ID, Bacteria ESCHERICHIA COLI (A)  Final   Organism ID, Bacteria ENTEROBACTER CLOACAE (A)  Final      Susceptibility   Enterobacter cloacae - MIC*    CEFAZOLIN >=64 RESISTANT Resistant     CEFEPIME 2 SENSITIVE Sensitive     CIPROFLOXACIN <=0.25 SENSITIVE Sensitive     GENTAMICIN <=1 SENSITIVE Sensitive     IMIPENEM 0.5 SENSITIVE Sensitive     NITROFURANTOIN 32 SENSITIVE Sensitive     TRIMETH/SULFA <=20 SENSITIVE Sensitive     PIP/TAZO >=128 RESISTANT Resistant     * 60,000 COLONIES/mL ENTEROBACTER CLOACAE   Escherichia coli - MIC*    AMPICILLIN 4 SENSITIVE Sensitive     CEFAZOLIN <=4 SENSITIVE Sensitive     CEFEPIME <=0.12 SENSITIVE Sensitive     CEFTRIAXONE <=0.25 SENSITIVE Sensitive     CIPROFLOXACIN <=0.25 SENSITIVE Sensitive      GENTAMICIN <=1 SENSITIVE Sensitive     IMIPENEM <=0.25 SENSITIVE Sensitive     NITROFURANTOIN 32 SENSITIVE Sensitive     TRIMETH/SULFA <=20 SENSITIVE Sensitive     AMPICILLIN/SULBACTAM <=2 SENSITIVE Sensitive     PIP/TAZO <=4 SENSITIVE Sensitive     * >=100,000 COLONIES/mL ESCHERICHIA COLI     Radiology Studies: No results found.  Scheduled Meds:  amiodarone  200 mg Oral Daily   apixaban  5 mg Oral BID   busPIRone  5 mg Oral BID   colchicine  0.3 mg Oral Daily   cycloSPORINE  1 drop Both Eyes BID   docusate sodium  100 mg Oral BID   DULoxetine  60 mg Oral Daily   fluticasone  2 spray Each Nare Daily   insulin aspart  0-9 Units Subcutaneous TID WC   loratadine  10 mg Oral Daily   metoprolol succinate  25 mg Oral Daily   multivitamin with minerals   Oral Daily   pantoprazole  40 mg Oral Daily   polyethylene glycol  17 g Oral Daily   potassium chloride SA  60 mEq Oral BID   rosuvastatin  10 mg Oral QPM   senna-docusate  1 tablet Oral BID   sodium chloride flush  3 mL Intravenous Q12H   sodium chloride flush  3 mL Intravenous Q12H   sodium chloride flush  3 mL Intravenous Q12H   sodium chloride flush  3 mL Intravenous Q12H   Continuous Infusions:  sodium chloride     sodium chloride     sodium chloride     sodium chloride 10 mL/hr at 02/20/22 0446   furosemide 160 mg (02/20/22 0807)     LOS: 8 days   Shelly Coss, MD [facility] P7/10/2021, 10:54 AM

## 2022-02-20 NOTE — Progress Notes (Signed)
Physical Therapy Treatment Patient Details Name: [name] MRN: [id number] DOB: 05-17-1956 Today's Date: 02/20/2022   History of Present Illness Pt is a 66 yo femaled presenting with SOB and increased LE edema. Admitted 02/12/2022 for CHF exacerbation. Recent hospitalization from 5/30-6/5 for CHF exacerbation. Previous trip to ED for similar symptoms on 6/20, with fall returning home. 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    PT Comments    Pt had recently ambulated with mobility tech so focused on seated and standing strengthening exercises, core stability, and balance exercises. Pt needed seated rest break after each set of exercises. Pt ambulated 16' with RW in room with min guard A. Fatigued quickly with all activity but otherwise tolerated well with VSS. PT will continue to follow.    Recommendations for follow up therapy are one component of a multi-disciplinary discharge planning process, led by the attending physician.  Recommendations may be updated based on patient status, additional functional criteria and insurance authorization.  Follow Up Recommendations  Outpatient PT     Assistance Recommended at Discharge PRN  Patient can return home with the following A little help with walking and/or transfers;A little help with bathing/dressing/bathroom;Assistance with cooking/housework;Assist for transportation;Help with stairs or ramp for entrance   Equipment Recommendations  None recommended by PT    Recommendations for Other Services       Precautions / Restrictions Precautions Precautions: Fall Precaution Comments: increased LE swelling Restrictions Weight Bearing Restrictions: No     Mobility  Bed Mobility Overal bed mobility: Modified Independent Bed Mobility: Sit to Supine       Sit to supine: Modified independent (Device/Increase  time)   General bed mobility comments: pt able to return to supine without assist    Transfers Overall transfer level: Modified independent Equipment used: Rolling walker (2 wheels) Transfers: Sit to/from Stand Sit to Stand: Modified independent (Device/Increase time)           General transfer comment: from bed and toilet    Ambulation/Gait Ambulation/Gait assistance: Min guard Gait Distance (Feet): 16 Feet Assistive device: Rolling walker (2 wheels) Gait Pattern/deviations: Step-through pattern, Decreased step length - right, Decreased step length - left, Decreased stride length, Wide base of support Gait velocity: decreased Gait velocity interpretation: <1.8 ft/sec, indicate of risk for recurrent falls   General Gait Details: heavy reliance on RW today   Stairs             Wheelchair Mobility    Modified Rankin (Stroke Patients Only)       Balance Overall balance assessment: Needs assistance Sitting-balance support: Feet supported, No upper extremity supported Sitting balance-Leahy Scale: Good     Standing balance support: Single extremity supported, During functional activity, Reliant on assistive device for balance Standing balance-Leahy Scale: Poor Standing balance comment: pt cannot safely reach out of BOS, worked on standing balance within RW with unilateral support                            Cognition Arousal/Alertness: Awake/alert Behavior During Therapy: WFL for tasks assessed/performed Overall Cognitive Status: Within Functional Limits for tasks assessed  Exercises General Exercises - Lower Extremity Long Arc Quad: AROM, Both, 10 reps, Seated (with manual resistance) Heel Slides: AROM, 10 reps, Seated (with manual resistance) Other Exercises Other Exercises: push/ pull against resistance 10x Other Exercises: shoulder abduction against resistance 10x Other Exercises:  standing reaching with each UE in all planes    General Comments General comments (skin integrity, edema, etc.): una boots rolling down and pinching pt's leg, pt reports they are getting changed today      Pertinent Vitals/Pain Pain Assessment Pain Assessment: Faces Faces Pain Scale: Hurts little more Pain Location: LE Pain Descriptors / Indicators: Aching Pain Intervention(s): Monitored during session    Home Living                          Prior Function            PT Goals (current goals can now be found in the care plan section) Acute Rehab PT Goals Patient Stated Goal: to go home PT Goal Formulation: With patient Time For Goal Achievement: 02/27/22 Potential to Achieve Goals: Good Progress towards PT goals: Progressing toward goals    Frequency    Min 2X/week      PT Plan Current plan remains appropriate    Co-evaluation              AM-PAC PT "6 Clicks" Mobility   Outcome Measure  Help needed turning from your back to your side while in a flat bed without using bedrails?: None Help needed moving from lying on your back to sitting on the side of a flat bed without using bedrails?: None Help needed moving to and from a bed to a chair (including a wheelchair)?: None Help needed standing up from a chair using your arms (e.g., wheelchair or bedside chair)?: None Help needed to walk in hospital room?: A Little Help needed climbing 3-5 steps with a railing? : A Little 6 Click Score: 22    End of Session   Activity Tolerance: Patient tolerated treatment well Patient left: with call bell/phone within reach;in bed Nurse Communication: Mobility status PT Visit Diagnosis: Other abnormalities of gait and mobility (R26.89);Muscle weakness (generalized) (M62.81)     Time: 4132-4401 PT Time Calculation (min) (ACUTE ONLY): 27 min  Charges:  $Therapeutic Exercise: 23-37 mins                     Leighton Roach, PT  Acute Rehab Services Secure  chat preferred Office [place] 02/20/2022, 2:11 PM

## 2022-02-20 NOTE — Progress Notes (Signed)
Patient ID: [name], female   DOB: 1956-03-20, 66 y.o.   MRN: [id number] [facility] Progress Note   Assessment/ Plan:   1. Acute kidney Injury on chronic kidney disease stage IV: Baseline creatinine suspected to be closer to 3.0-3.5 with lower creatinine suspected to be artificially induced by dilution.  Ongoing successful diuresis on furosemide 160 mg twice daily with weight reduction of 3 kg overnight (net -13 kg since admission).  Labs pending from this morning to assess renal function.  She unfortunately does not know her dry weight and we will continue current diuretic dosing with additional information to be gained from right heart catheterization. 2.  Acute exacerbation of systolic heart failure: With recurrent hospitalizations for CHF exacerbation and improving volume status on intravenous furosemide (takes torsemide/metolazone as an outpatient).  On schedule for right heart catheterization today. 3.  Atrial fibrillation: Appears to be successfully rate controlled on metoprolol. 4.  Anemia: Likely secondary to chronic kidney disease as well as iron deficiency for which she is on intravenous iron supplementation.  Subjective:   Reports to be feeling slightly better albeit tired this morning.  Denies chest pain or shortness of breath.   Objective:   BP (!) 111/57 (BP Location: Right Arm)   Pulse 85   Temp 97.8 F (36.6 C) (Oral)   Resp 16   Ht '5\' 7"'$  (1.702 m)   Wt 126.6 kg   SpO2 95%   BMI 43.70 kg/m   Intake/Output Summary (Last 24 hours) at 02/20/2022 0714 Last data filed at 02/20/2022 0500 Gross per 24 hour  Intake 610 ml  Output 3675 ml  Net -3065 ml   Weight change: -2.446 kg  Physical Exam: Gen: Resting comfortably flat in bed, awakens and engages in conversation CVS: Pulse regular rhythm, normal rate, S1 and S2 normal Resp: Diminished breath sounds over bases, no distinct rales or rhonchi Abd: Soft, obese, nontender, bowel sounds normal Ext: 2+  lower extremity edema, 1+ upper extremity and edema over her back.  Imaging: No results found.  Labs: BMET Recent Labs  Lab 02/14/22 0218 02/14/22 1303 02/14/22 1304 02/15/22 0326 02/16/22 0618 02/17/22 0252 02/18/22 0240 02/19/22 0429  NA 136 140 141 138 139 138 142 140  K 3.7 3.7 3.5 3.4* 3.8 4.0 3.8 3.5  CL 102  --   --  101 103 100 99 98  CO2 25  --   --  '27 27 30 30 31  '$ GLUCOSE 124*  --   --  128* 104* 129* 119* 99  BUN 24*  --   --  27* 33* 37* 42* 42*  CREATININE 2.84*  --   --  2.80* 2.90* 3.01* 3.33* 3.10*  CALCIUM 8.8*  --   --  8.8* 9.0 9.3 9.4 9.3   CBC Recent Labs  Lab 02/14/22 1303 02/14/22 1304 02/17/22 0252  WBC  --   --  5.0  HGB 9.5* 9.5* 8.4*  HCT 28.0* 28.0* 28.1*  MCV  --   --  83.1  PLT  --   --  224    Medications:     amiodarone  200 mg Oral Daily   apixaban  5 mg Oral BID   busPIRone  5 mg Oral BID   colchicine  0.3 mg Oral Daily   cycloSPORINE  1 drop Both Eyes BID   docusate sodium  100 mg Oral BID   DULoxetine  60 mg Oral Daily   fluticasone  2 spray Each Nare  Daily   insulin aspart  0-9 Units Subcutaneous TID WC   loratadine  10 mg Oral Daily   metoprolol succinate  25 mg Oral Daily   multivitamin with minerals   Oral Daily   pantoprazole  40 mg Oral Daily   polyethylene glycol  17 g Oral Daily   potassium chloride SA  60 mEq Oral BID   rosuvastatin  10 mg Oral QPM   senna-docusate  1 tablet Oral BID   sodium chloride flush  3 mL Intravenous Q12H   sodium chloride flush  3 mL Intravenous Q12H   sodium chloride flush  3 mL Intravenous Q12H   sodium chloride flush  3 mL Intravenous Q12H    Elmarie Shiley, MD 02/20/2022, 7:14 AM

## 2022-02-20 NOTE — Progress Notes (Signed)
Previous unna boots removed. BLE cleansed. No wound care orders. Ortho Tech called to replace unna boots.

## 2022-02-20 NOTE — Progress Notes (Signed)
Orthopedic Tech Progress Note Patient Details:  [name] 1956-03-29 [id number]  Ortho Devices Type of Ortho Device: Louretta Parma boot Ortho Device/Splint Location: BLE Ortho Device/Splint Interventions: Ordered, Adjustment, Application   Post Interventions Patient Tolerated: Well Instructions Provided: Care of [place] 02/20/2022, 5:28 PM

## 2022-02-20 NOTE — Progress Notes (Signed)
PT Cancellation Note  Patient Details Name: [name] MRN: [id number] DOB: Jun 24, 1956   Cancelled Treatment:    Reason Eval/Treat Not Completed: Pain limiting ability to participate (HA). Pt reports HA and requests check back later time. Will check back this afternoon as time allows.   Leighton Roach, PT  Acute Rehab Services Secure chat preferred Office [place] 02/20/2022, 12:13 PM

## 2022-02-20 NOTE — Progress Notes (Signed)
Mobility Specialist Progress Note:   02/20/22 1250  Mobility  Activity Ambulated with assistance in hallway  Level of Assistance Standby assist, set-up cues, supervision of patient - no hands on  Assistive Device Front wheel walker  Distance Ambulated (ft) 230 ft  Activity Response Tolerated well  $Mobility charge 1 Mobility   Pt received in bed willing to participate in mobility. No complaints of pain. Required 1 seated rest break. Left EOB with call bell in reach and all needs met.    [facility] [name] Mobility Specialist

## 2022-02-20 NOTE — Progress Notes (Signed)
RT NOTE:  Pt will put CPAP on when ready. Machine is setup @ bedside

## 2022-02-21 DIAGNOSIS — I50813 Acute on chronic right heart failure: Secondary | ICD-10-CM | POA: Diagnosis not present

## 2022-02-21 LAB — GLUCOSE, CAPILLARY
Glucose-Capillary: 125 mg/dL — ABNORMAL HIGH (ref 70–99)
Glucose-Capillary: 132 mg/dL — ABNORMAL HIGH (ref 70–99)
Glucose-Capillary: 104 mg/dL — ABNORMAL HIGH (ref 70–99)
Glucose-Capillary: 141 mg/dL — ABNORMAL HIGH (ref 70–99)

## 2022-02-21 LAB — BASIC METABOLIC PANEL
Anion gap: 10 (ref 5–15)
BUN: 51 mg/dL — ABNORMAL HIGH (ref 8–23)
CO2: 31 mmol/L (ref 22–32)
Calcium: 9.4 mg/dL (ref 8.9–10.3)
Chloride: 96 mmol/L — ABNORMAL LOW (ref 98–111)
Creatinine, Ser: 3.22 mg/dL — ABNORMAL HIGH (ref 0.44–1.00)
GFR, Estimated: 15 mL/min — ABNORMAL LOW (ref >60)
Glucose, Bld: 121 mg/dL — ABNORMAL HIGH (ref 70–99)
Potassium: 3.9 mmol/L (ref 3.5–5.1)
Sodium: 137 mmol/L (ref 135–145)

## 2022-02-21 LAB — MAGNESIUM: Magnesium: 2.3 mg/dL (ref 1.7–2.4)

## 2022-02-21 MED ORDER — SODIUM CHLORIDE 0.9% FLUSH: 3.0000 mL | INTRAVENOUS | Status: DC | PRN

## 2022-02-21 MED ORDER — SODIUM CHLORIDE 0.9 % IV SOLN: INTRAVENOUS | Status: DC

## 2022-02-21 MED ORDER — METOLAZONE 2.5 MG PO TABS
2.5000 mg | ORAL_TABLET | Freq: Once | ORAL | Status: AC
  Administered 2022-02-21: 2.5 mg via ORAL
  Filled 2022-02-21: qty 1

## 2022-02-21 MED ORDER — SODIUM CHLORIDE 0.9 % IV SOLN: 250.0000 mL | INTRAVENOUS | Status: DC | PRN

## 2022-02-21 MED ORDER — SODIUM CHLORIDE 0.9% FLUSH
3.0000 mL | Freq: Two times a day (BID) | INTRAVENOUS | Status: DC
  Administered 2022-02-21 – 2022-02-22 (×2): 3 mL via INTRAVENOUS

## 2022-02-21 NOTE — Progress Notes (Signed)
Pt has refused cpap for tonight.  States she wears it sometimes but does not wish to wear it tonight.  RT will cont to monitor.

## 2022-02-21 NOTE — Progress Notes (Signed)
PROGRESS NOTE  [name]  QQV:[id number] DOB: [birth year] DOA: 02/12/2022 PCP: [name], NP   Brief Narrative:  Patient is a 66 year old female with history of systolic congestive heart failure, severe right ventricular failure, status post mitral valve repair, severe tricuspid regurgitation, chronic A-fib, CKD stage IV who presented here with orthopnea, PND, weight gain and found to have fluid overload.  Currently on IV diuresis.  Cardiology, nephrology following.  Plan for right heart cath on Wednesday  Assessment & Plan:  Principal Problem:   Acute on chronic right heart failure (HCC) Active Problems:   AF (paroxysmal atrial fibrillation) (HCC)   Essential hypertension   Diabetes mellitus type 2 in obese (HCC)   Type 2 diabetes mellitus with hyperlipidemia ([place])   Depression   Class 3 obesity (HCC)   NICM (nonischemic cardiomyopathy) ([place])   S/P mitral valve repair   Prolonged QT interval   Anxiety and depression   GERD (gastroesophageal reflux disease)  Acute on chronic biventricular systolic heart failure : Echocardiogram on 6/23 showed normal ejection fraction.  Right heart cath on 6/27 showed elevated biventricular pressure.  Presented with fluid overload, orthopnea, PND.  Currently on Lasix 160 mg twice a day along with metolazone.  Being followed by advanced heart failure team.  Continue metoprolol.  Monitor BMP.  Having good diuresis.  Remains on room air. Cardiology planning for repeat right heart cath on Wednesday  Paroxysmal A-fib: Failed Tikosyn.  Status post maze procedure on 5/22.  Underwent multiple cardioversions.  Currently in normal sinus rhythm.  Continue amiodarone, on Eliquis for anticoagulation.  CKD stage IV: Currently kidney function around baseline.  Monitor volume status.  Monitor BMP.  Nephrology following.  Normocytic anemia: Most likely in the setting of chronic kidney disease.  Iron studies showed low iron.  Given IV iron  Coronary  disease: No anginal symptoms at present.  Continue statin, beta-blocker.  Diabetes type 2: On Trulicity at home.  Continue current insulin regimen.  Monitor blood sugars  Depression/anxiety: On Cymbalta, BuSpar  History of sleep apnea: Not on CPAP.  Seen in the process of getting a new sleep study  Asymptomatic bacteriuria: Urine culture from 6/26 showed E. coli. Urinary symptoms.  Deferred treatment   Morbid obesity: BMI  of 45            DVT prophylaxis: apixaban (ELIQUIS) tablet 5 mg     Code Status: Full Code  Family Communication: None at bedside  Patient status:Inpatient  Patient is from :Home  Anticipated discharge PP:IRJJ  Estimated DC date:1-2 days.  Needs cardiology clearance   Consultants: Cardiology, nephrology  Procedures: None  Antimicrobials:  Anti-infectives (From admission, onward)    None       Subjective:  Patient seen and examined at the bedside this morning.  Hemodynamically stable.  Denies any complaints today.  Plan for cath  Objective: Vitals:   02/20/22 1133 02/20/22 1938 02/21/22 0354 02/21/22 0800  BP: 106/72 106/62 118/69 (!) 134/94  Pulse: 80 81 79 87  Resp: '18 18 18 19  '$ Temp: 98 F (36.7 C) (!) 97.5 F (36.4 C) 97.7 F (36.5 C) 98.1 F (36.7 C)  TempSrc: Oral Oral Oral Oral  SpO2: 100% 97% 97% 98%  Weight:   126.1 kg   Height:        Intake/Output Summary (Last 24 hours) at 02/21/2022 1057 Last data filed at 02/21/2022 1000 Gross per 24 hour  Intake 828.51 ml  Output 4450 ml  Net -3621.49 ml  Filed Weights   02/19/22 0517 02/20/22 0437 02/21/22 0354  Weight: 129 kg 126.6 kg 126.1 kg    Examination:  General exam: Overall comfortable, not in distress, morbidly obese HEENT: PERRL Respiratory system:  no wheezes or crackles, diminished air sounds on bases Cardiovascular system: S1 & S2 heard, RRR.  Gastrointestinal system: Abdomen is nondistended, soft and nontender. Central nervous system: Alert and  oriented Extremities: Bilateral lower extremity pitting edema/wraps, no clubbing ,no cyanosis Skin: No rashes, no ulcers,no icterus      Data Reviewed: I have personally reviewed following labs and imaging studies  CBC: Recent Labs  Lab 02/14/22 1303 02/14/22 1304 02/17/22 0252 02/20/22 0727  WBC  --   --  5.0 5.1  HGB 9.5* 9.5* 8.4* 8.9*  HCT 28.0* 28.0* 28.1* 29.0*  MCV  --   --  83.1 83.8  PLT  --   --  224 474   Basic Metabolic Panel: Recent Labs  Lab 02/16/22 0618 02/17/22 0252 02/18/22 0240 02/19/22 0429 02/20/22 0727 02/21/22 0144  NA 139 138 142 140 140 137  K 3.8 4.0 3.8 3.5 4.1 3.9  CL 103 100 99 98 100 96*  CO2 '27 30 30 31 '$ 34* 31  GLUCOSE 104* 129* 119* 99 90 121*  BUN 33* 37* 42* 42* 44* 51*  CREATININE 2.90* 3.01* 3.33* 3.10* 3.29* 3.22*  CALCIUM 9.0 9.3 9.4 9.3 9.3 9.4  MG 1.9 2.1  --   --   --  2.3     Recent Results (from the past 240 hour(s))  SARS Coronavirus 2 by RT PCR (hospital order, performed in [facility] hospital lab) *cepheid single result test* Anterior Nasal Swab     Status: None   Collection Time: 02/12/22 12:26 PM   Specimen: Anterior Nasal Swab  Result Value Ref Range Status   SARS Coronavirus 2 by RT PCR NEGATIVE NEGATIVE Final    Comment: (NOTE) SARS-CoV-2 target nucleic acids are NOT DETECTED.  The SARS-CoV-2 RNA is generally detectable in upper and lower respiratory specimens during the acute phase of infection. The lowest concentration of SARS-CoV-2 viral copies this assay can detect is 250 copies / mL. A negative result does not preclude SARS-CoV-2 infection and should not be used as the sole basis for treatment or other patient management decisions.  A negative result may occur with improper specimen collection / handling, submission of specimen other than nasopharyngeal swab, presence of viral mutation(s) within the areas targeted by this assay, and inadequate number of viral copies (<250 copies / mL). A negative  result must be combined with clinical observations, patient history, and epidemiological information.  Fact Sheet for Patients:   https://www.patel.info/  Fact Sheet for Healthcare Providers: https://hall.com/  This test is not yet approved or  cleared by the [place] FDA and has been authorized for detection and/or diagnosis of SARS-CoV-2 by FDA under an Emergency Use Authorization (EUA).  This EUA will remain in effect (meaning this test can be used) for the duration of the COVID-19 declaration under Section 564(b)(1) of the Act, 21 U.S.C. section 360bbb-3(b)(1), unless the authorization is terminated or revoked sooner.  Performed at [facility], [address] [place], [place] [address]   Urine Culture     Status: Abnormal   Collection Time: 02/13/22  8:09 AM   Specimen: Urine, Clean Catch  Result Value Ref Range Status   Specimen Description URINE, CLEAN CATCH  Final   Special Requests   Final    NONE  Performed at [facility], [place] [name] [place], [place] [address]    Culture (A)  Final    >=100,000 COLONIES/mL ESCHERICHIA COLI 60,000 COLONIES/mL ENTEROBACTER CLOACAE    Report Status 02/15/2022 FINAL  Final   Organism ID, Bacteria ESCHERICHIA COLI (A)  Final   Organism ID, Bacteria ENTEROBACTER CLOACAE (A)  Final      Susceptibility   Enterobacter cloacae - MIC*    CEFAZOLIN >=64 RESISTANT Resistant     CEFEPIME 2 SENSITIVE Sensitive     CIPROFLOXACIN <=0.25 SENSITIVE Sensitive     GENTAMICIN <=1 SENSITIVE Sensitive     IMIPENEM 0.5 SENSITIVE Sensitive     NITROFURANTOIN 32 SENSITIVE Sensitive     TRIMETH/SULFA <=20 SENSITIVE Sensitive     PIP/TAZO >=128 RESISTANT Resistant     * 60,000 COLONIES/mL ENTEROBACTER CLOACAE   Escherichia coli - MIC*    AMPICILLIN 4 SENSITIVE Sensitive     CEFAZOLIN <=4 SENSITIVE Sensitive     CEFEPIME <=0.12 SENSITIVE Sensitive     CEFTRIAXONE <=0.25  SENSITIVE Sensitive     CIPROFLOXACIN <=0.25 SENSITIVE Sensitive     GENTAMICIN <=1 SENSITIVE Sensitive     IMIPENEM <=0.25 SENSITIVE Sensitive     NITROFURANTOIN 32 SENSITIVE Sensitive     TRIMETH/SULFA <=20 SENSITIVE Sensitive     AMPICILLIN/SULBACTAM <=2 SENSITIVE Sensitive     PIP/TAZO <=4 SENSITIVE Sensitive     * >=100,000 COLONIES/mL ESCHERICHIA COLI     Radiology Studies: No results found.  Scheduled Meds:  amiodarone  200 mg Oral Daily   apixaban  5 mg Oral BID   busPIRone  5 mg Oral BID   colchicine  0.3 mg Oral Daily   cycloSPORINE  1 drop Both Eyes BID   docusate sodium  100 mg Oral BID   DULoxetine  60 mg Oral Daily   fluticasone  2 spray Each Nare Daily   insulin aspart  0-9 Units Subcutaneous TID WC   loratadine  10 mg Oral Daily   metolazone  2.5 mg Oral Once   metoprolol succinate  25 mg Oral Daily   multivitamin with minerals   Oral Daily   pantoprazole  40 mg Oral Daily   polyethylene glycol  17 g Oral Daily   potassium chloride SA  60 mEq Oral BID   rosuvastatin  10 mg Oral QPM   senna-docusate  1 tablet Oral BID   sodium chloride flush  3 mL Intravenous Q12H   sodium chloride flush  3 mL Intravenous Q12H   sodium chloride flush  3 mL Intravenous Q12H   sodium chloride flush  3 mL Intravenous Q12H   Continuous Infusions:  sodium chloride     sodium chloride     sodium chloride     sodium chloride 10 mL/hr at 02/20/22 0446   furosemide 160 mg (02/21/22 0833)     LOS: 9 days   Shelly Coss, MD [facility] P7/11/2021, 10:57 AM

## 2022-02-21 NOTE — Progress Notes (Signed)
Patient ID: [name], female   DOB: 08-Dec-1955, 66 y.o.   MRN: [id number]     Advanced Heart Failure Rounding Note  PCP-Cardiologist: Buford Dresser, MD   Subjective:    RHC 06/27 Markedly elevated biventricular filling pressures, PA sat low but fick CO preserved Findings:   RA = 27  RV = 62/25 PA = 60/27 (41) PCW = 25 (v = 35) Fick cardiac output/index = 6.8/2.8 PVR = 2.4 WU Ao sat = 90% PA sat = 45%, 49% PAPi = 1.2  Continued to diurese with IV lasix 160 BID + 2.5 mg metolazone yesterday.  I/Os net negative 2396.   Creatinine 3.01 => 3.3 => 3.1 => 3.29 => 3.22.  OK at rest.  Fatigues walking around her room.    Objective:   Weight Range: 126.1 kg Body mass index is 43.56 kg/m.   Vital Signs:   Temp:  [97.5 F (36.4 C)-98.1 F (36.7 C)] 98.1 F (36.7 C) (07/04 0800) Pulse Rate:  [79-87] 87 (07/04 0800) Resp:  [18-19] 19 (07/04 0800) BP: (106-134)/(62-94) 134/94 (07/04 0800) SpO2:  [97 %-100 %] 98 % (07/04 0800) Weight:  [126.1 kg] 126.1 kg (07/04 0354) Last BM Date : 02/20/22  Weight change: Filed Weights   02/19/22 0517 02/20/22 0437 02/21/22 0354  Weight: 129 kg 126.6 kg 126.1 kg    Intake/Output:   Intake/Output Summary (Last 24 hours) at 02/21/2022 0918 Last data filed at 02/21/2022 0833 Gross per 24 hour  Intake 828.51 ml  Output 3250 ml  Net -2421.49 ml      Physical Exam    General: NAD Neck: JVP 14-16 cm, no thyromegaly or thyroid nodule.  Lungs: Clear to auscultation bilaterally with normal respiratory effort. CV: Nondisplaced PMI.  Heart regular S1/S2, no S3/S4, 3/6 HSM LLSB.  1+ edema to knees.  Abdomen: Soft, nontender, no hepatosplenomegaly, no distention.  Skin: Intact without lesions or rashes.  Neurologic: Alert and oriented x 3.  Psych: Normal affect. Extremities: No clubbing or cyanosis.  HEENT: Normal.   Telemetry   NSR with PVCs, personally reviewed.   EKG    N/A   Labs    CBC Recent Labs     02/20/22 0727  WBC 5.1  HGB 8.9*  HCT 29.0*  MCV 83.8  PLT 732    Basic Metabolic Panel Recent Labs    02/20/22 0727 02/21/22 0144  NA 140 137  K 4.1 3.9  CL 100 96*  CO2 34* 31  GLUCOSE 90 121*  BUN 44* 51*  CREATININE 3.29* 3.22*  CALCIUM 9.3 9.4  MG  --  2.3   Liver Function Tests No results for input(s): "AST", "ALT", "ALKPHOS", "BILITOT", "PROT", "ALBUMIN" in the last 72 hours. No results for input(s): "LIPASE", "AMYLASE" in the last 72 hours. Cardiac Enzymes No results for input(s): "CKTOTAL", "CKMB", "CKMBINDEX", "TROPONINI" in the last 72 hours.  BNP: BNP (last 3 results) Recent Labs    01/26/22 1156 02/06/22 1809 02/12/22 1226  BNP 375.6* 550.8* 517.3*    ProBNP (last 3 results) No results for input(s): "PROBNP" in the last 8760 hours.   D-Dimer No results for input(s): "DDIMER" in the last 72 hours. Hemoglobin A1C No results for input(s): "HGBA1C" in the last 72 hours. Fasting Lipid Panel No results for input(s): "CHOL", "HDL", "LDLCALC", "TRIG", "CHOLHDL", "LDLDIRECT" in the last 72 hours. Thyroid Function Tests No results for input(s): "TSH", "T4TOTAL", "T3FREE", "THYROIDAB" in the last 72 hours.  Invalid input(s): "FREET3"  Other results:  Imaging    No results found.   Medications:     Scheduled Medications:  amiodarone  200 mg Oral Daily   apixaban  5 mg Oral BID   busPIRone  5 mg Oral BID   colchicine  0.3 mg Oral Daily   cycloSPORINE  1 drop Both Eyes BID   docusate sodium  100 mg Oral BID   DULoxetine  60 mg Oral Daily   fluticasone  2 spray Each Nare Daily   insulin aspart  0-9 Units Subcutaneous TID WC   loratadine  10 mg Oral Daily   metolazone  2.5 mg Oral Once   metoprolol succinate  25 mg Oral Daily   multivitamin with minerals   Oral Daily   pantoprazole  40 mg Oral Daily   polyethylene glycol  17 g Oral Daily   potassium chloride SA  60 mEq Oral BID   rosuvastatin  10 mg Oral QPM   senna-docusate  1 tablet  Oral BID   sodium chloride flush  3 mL Intravenous Q12H   sodium chloride flush  3 mL Intravenous Q12H   sodium chloride flush  3 mL Intravenous Q12H   sodium chloride flush  3 mL Intravenous Q12H    Infusions:  sodium chloride     sodium chloride     sodium chloride     sodium chloride 10 mL/hr at 02/20/22 0446   furosemide 160 mg (02/21/22 0833)    PRN Medications: sodium chloride, sodium chloride, sodium chloride, acetaminophen, albuterol, nitroGLYCERIN, ondansetron (ZOFRAN) IV, oxyCODONE, sodium chloride flush, sodium chloride flush, sodium chloride flush    Patient Profile   66 y.o. female with history of chronic biventricular systolic CHF, PAF/AFL, CKD IV, CAD, valvular heart disease, DMII, OSA. Multiple admissions for decompensated HF mostly in setting of recurrent AF.    Now presenting with a/c CHF.   Assessment/Plan     1. Acute on chronic Biventricular Systolic Heart Failure: - RHC (6/22) with low filling pressures with moderately reduced CO. - Echo (7/22) in setting of AFL with RVR, showed EF 30-35%, moderate LVH, moderate RV dysfunction, PASP 48, s/p MV repair with mild MR and mean gradient 10 mmHg with HR around 130, s/p TV repair with mild-moderate TR, dilated IVC.   - RHC 4/23 markedly elevated biventricular pressures - Echo 4/23 EF 35-40% RV moderately down severe TR. Moderate MS mean grad 67m HG - TEE 01/20/22: Normal LV EF 55%, severe RV dysfunction, trivial MR, mean gradient 3 across MV prosthesis, severe TR - Multiple admissions recently for HF mostly occur in setting of recurrent AF. Currently in NSR. - RHC 06/27 markedly elevated biventricular pressures, low PAPi, PA sats low but preserved Fick CO. - NYHA IIIb/IV. Diuresing well but still seems volume overloaded with JVP up.  Yesterday had IV lasix 160 BID + 2.5 mg metolazone. Repeat this regimen again today. Creatinine fairly stable 3.29 => 3.22.  - In and out of AFL at recent visits. Maintaining SR this  admit. - UNNA boots - GDMT limited by CKD.  - No Bidil for now.  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 Remains volume overloaded, continue diuresis as above.  I worry that she may end up with dialysis.  - Repeat RHC prior to discharge once she has been diuresed further => now arranged Wednesday, 07/05. Discussed risks/benefits and she agrees to procedure.  - Avoid milrinone with propensity for AF.  2. PAF/AFL - She has failed Tikosyn. - She had Maze in 5/22 and DCCV in 6/22 & 04/19/21, last of which she had ERAF. - Multiple admissions recently for HF mostly occur in setting of recurrent AF/AFL. Last DCCV 06/13.  - Currently maintaining SR - Continue amiodarone + Eliquis  - Unable to tolerate AF at all. Follows with EP. Not a candidate for Afib ablation or AV nodal ablation/PPM - Has OSA, awaiting split night study for CPAP titration.  - Keep K > 4.0 and Mg > 2.0 (replete today).    3. CKD Stage IV   - Nephrology consulted.  - Baseline Scr likely 3-3.5, recent Scr 2-2.5 likely associated with dilution in setting of volume overload. - Scr 3.0 => 3.3 => 3.1 => 3.29 => 3.22 - Continue with aggressive diuresis. I worry that she will eventually need HD for volume management.  - No SGLT2i, GFR has been consistently less than 20   4. CAD - Cath (2019): Mild nonobstructive CAD - No chest pain.  - Continue statin + beta blocker - no ASA w/ Eliquis    5. Valvular Heart Disease: - S/p MV and TV repair in 5/22.  - Echo 02/25/21 with mild-moderate TR.   - Echo 4/23 35-40% RV moderately down. severe TR. Moderate MS mean gradient 108mHG  - TEE 06/23: LV EF 55% with severe RV dysfunction, trivial MR, mean gradient 3 across MV prosthesis, severe TR - Seen by structural heart team. Has been turned down for TV valve in ring due to markedly elevated wedge pressures despite diuresis.   6. Obesity - Body mass index is 48 - Discussed portion  control.    7. DMII - Hgb A1c 6.7% 04/23 - GFR has been too low for SGLT2i   8. OSA - initial sleep study positive but was awaiting split night study to titrate CPAP - Has been referred to Dr. TRadford Pax- suspect this is part of her AF recurrence   9. PVCs - Burden improved on tele this am - Continue PO amiodarone  - Keep K > 4 and Mag > 2  10. Bacteriuria - Urine culture grew E.Coli and Enterobacter cloacae - Not currently treated d/t lack of symptoms  11. Anemia - Hgb 8s-9s - Iron stores low - Feraheme given.   Continue to mobilize.  PT/OT.  Length of Stay: 9[place] MD  02/21/2022, 9:18 AM  Advanced Heart Failure Team Pager [phone](M-F; 7a - 5p)  Please contact [facility] for night-coverage after hours (5p -7a ) and weekends on [email or web address]

## 2022-02-21 NOTE — Progress Notes (Signed)
Patient ID: [name], female   DOB: 1955-12-29, 66 y.o.   MRN: [id number]  [facility] Progress Note   Assessment/ Plan:   1. Acute kidney Injury on chronic kidney disease stage IV: Baseline creatinine suspected to be closer to 3.0-3.5 with lower creatinine suspected to be artificially induced by dilution.  Continues to have a fairly good response to high-dose diuretics with net negative fluid balance of 2.4 L overnight (3.2 L urine output) but presently with weight down only by 0.5 kg.  Renal function relatively unchanged overnight.  Continue to follow with ongoing diuretics and anticipated right heart catheterization tomorrow.  Without indication for dialysis at this time. 2.  Acute exacerbation of systolic heart failure: With recurrent hospitalizations for CHF exacerbation and improving volume status on intravenous furosemide (takes torsemide/metolazone as an outpatient).  Right heart catheterization was rescheduled for tomorrow. 3.  Atrial fibrillation: Appears to be successfully rate controlled on metoprolol. 4.  Anemia: Likely secondary to chronic kidney disease as well as iron deficiency for which she is on intravenous iron supplementation.  Subjective:   Reports to have had an uneventful night and attempted ambulation in room with walker.   Objective:   BP 118/69 (BP Location: Left Leg)   Pulse 79   Temp 97.7 F (36.5 C) (Oral)   Resp 18   Ht '5\' 7"'$  (1.702 m)   Wt 126.1 kg   SpO2 97%   BMI 43.56 kg/m   Intake/Output Summary (Last 24 hours) at 02/21/2022 0718 Last data filed at 02/21/2022 0300 Gross per 24 hour  Intake 853.51 ml  Output 3250 ml  Net -2396.49 ml   Weight change: -0.408 kg  Physical Exam: Gen: Comfortably in bed eating breakfast CVS: Pulse regular rhythm, normal rate, S1 and S2 normal Resp: Diminished breath sounds over bases, no distinct rales or rhonchi Abd: Soft, obese, nontender, bowel sounds normal Ext: 2+ lower extremity edema, 1+ upper  extremity and edema over her back.  Imaging: No results found.  Labs: BMET Recent Labs  Lab 02/15/22 0326 02/16/22 0618 02/17/22 0252 02/18/22 0240 02/19/22 0429 02/20/22 0727 02/21/22 0144  NA 138 139 138 142 140 140 137  K 3.4* 3.8 4.0 3.8 3.5 4.1 3.9  CL 101 103 100 99 98 100 96*  CO2 '27 27 30 30 31 '$ 34* 31  GLUCOSE 128* 104* 129* 119* 99 90 121*  BUN 27* 33* 37* 42* 42* 44* 51*  CREATININE 2.80* 2.90* 3.01* 3.33* 3.10* 3.29* 3.22*  CALCIUM 8.8* 9.0 9.3 9.4 9.3 9.3 9.4   CBC Recent Labs  Lab 02/14/22 1303 02/14/22 1304 02/17/22 0252 02/20/22 0727  WBC  --   --  5.0 5.1  HGB 9.5* 9.5* 8.4* 8.9*  HCT 28.0* 28.0* 28.1* 29.0*  MCV  --   --  83.1 83.8  PLT  --   --  224 217    Medications:     amiodarone  200 mg Oral Daily   apixaban  5 mg Oral BID   busPIRone  5 mg Oral BID   colchicine  0.3 mg Oral Daily   cycloSPORINE  1 drop Both Eyes BID   docusate sodium  100 mg Oral BID   DULoxetine  60 mg Oral Daily   fluticasone  2 spray Each Nare Daily   insulin aspart  0-9 Units Subcutaneous TID WC   loratadine  10 mg Oral Daily   metoprolol succinate  25 mg Oral Daily   multivitamin with minerals  Oral Daily   pantoprazole  40 mg Oral Daily   polyethylene glycol  17 g Oral Daily   potassium chloride SA  60 mEq Oral BID   rosuvastatin  10 mg Oral QPM   senna-docusate  1 tablet Oral BID   sodium chloride flush  3 mL Intravenous Q12H   sodium chloride flush  3 mL Intravenous Q12H   sodium chloride flush  3 mL Intravenous Q12H   sodium chloride flush  3 mL Intravenous Q12H    Elmarie Shiley, MD 02/21/2022, 7:18 AM

## 2022-02-21 NOTE — H&P (View-Only) (Signed)
Patient ID: [name], female   DOB: 03-Jun-1956, 66 y.o.   MRN: [id number]     Advanced Heart Failure Rounding Note  PCP-Cardiologist: Buford Dresser, MD   Subjective:    RHC 06/27 Markedly elevated biventricular filling pressures, PA sat low but fick CO preserved Findings:   RA = 27  RV = 62/25 PA = 60/27 (41) PCW = 25 (v = 35) Fick cardiac output/index = 6.8/2.8 PVR = 2.4 WU Ao sat = 90% PA sat = 45%, 49% PAPi = 1.2  Continued to diurese with IV lasix 160 BID + 2.5 mg metolazone yesterday.  I/Os net negative 2396.   Creatinine 3.01 => 3.3 => 3.1 => 3.29 => 3.22.  OK at rest.  Fatigues walking around her room.    Objective:   Weight Range: 126.1 kg Body mass index is 43.56 kg/m.   Vital Signs:   Temp:  [97.5 F (36.4 C)-98.1 F (36.7 C)] 98.1 F (36.7 C) (07/04 0800) Pulse Rate:  [79-87] 87 (07/04 0800) Resp:  [18-19] 19 (07/04 0800) BP: (106-134)/(62-94) 134/94 (07/04 0800) SpO2:  [97 %-100 %] 98 % (07/04 0800) Weight:  [126.1 kg] 126.1 kg (07/04 0354) Last BM Date : 02/20/22  Weight change: Filed Weights   02/19/22 0517 02/20/22 0437 02/21/22 0354  Weight: 129 kg 126.6 kg 126.1 kg    Intake/Output:   Intake/Output Summary (Last 24 hours) at 02/21/2022 0918 Last data filed at 02/21/2022 0833 Gross per 24 hour  Intake 828.51 ml  Output 3250 ml  Net -2421.49 ml      Physical Exam    General: NAD Neck: JVP 14-16 cm, no thyromegaly or thyroid nodule.  Lungs: Clear to auscultation bilaterally with normal respiratory effort. CV: Nondisplaced PMI.  Heart regular S1/S2, no S3/S4, 3/6 HSM LLSB.  1+ edema to knees.  Abdomen: Soft, nontender, no hepatosplenomegaly, no distention.  Skin: Intact without lesions or rashes.  Neurologic: Alert and oriented x 3.  Psych: Normal affect. Extremities: No clubbing or cyanosis.  HEENT: Normal.   Telemetry   NSR with PVCs, personally reviewed.   EKG    N/A   Labs    CBC Recent Labs     02/20/22 0727  WBC 5.1  HGB 8.9*  HCT 29.0*  MCV 83.8  PLT 856    Basic Metabolic Panel Recent Labs    02/20/22 0727 02/21/22 0144  NA 140 137  K 4.1 3.9  CL 100 96*  CO2 34* 31  GLUCOSE 90 121*  BUN 44* 51*  CREATININE 3.29* 3.22*  CALCIUM 9.3 9.4  MG  --  2.3   Liver Function Tests No results for input(s): "AST", "ALT", "ALKPHOS", "BILITOT", "PROT", "ALBUMIN" in the last 72 hours. No results for input(s): "LIPASE", "AMYLASE" in the last 72 hours. Cardiac Enzymes No results for input(s): "CKTOTAL", "CKMB", "CKMBINDEX", "TROPONINI" in the last 72 hours.  BNP: BNP (last 3 results) Recent Labs    01/26/22 1156 02/06/22 1809 02/12/22 1226  BNP 375.6* 550.8* 517.3*    ProBNP (last 3 results) No results for input(s): "PROBNP" in the last 8760 hours.   D-Dimer No results for input(s): "DDIMER" in the last 72 hours. Hemoglobin A1C No results for input(s): "HGBA1C" in the last 72 hours. Fasting Lipid Panel No results for input(s): "CHOL", "HDL", "LDLCALC", "TRIG", "CHOLHDL", "LDLDIRECT" in the last 72 hours. Thyroid Function Tests No results for input(s): "TSH", "T4TOTAL", "T3FREE", "THYROIDAB" in the last 72 hours.  Invalid input(s): "FREET3"  Other results:  Imaging    No results found.   Medications:     Scheduled Medications:  amiodarone  200 mg Oral Daily   apixaban  5 mg Oral BID   busPIRone  5 mg Oral BID   colchicine  0.3 mg Oral Daily   cycloSPORINE  1 drop Both Eyes BID   docusate sodium  100 mg Oral BID   DULoxetine  60 mg Oral Daily   fluticasone  2 spray Each Nare Daily   insulin aspart  0-9 Units Subcutaneous TID WC   loratadine  10 mg Oral Daily   metolazone  2.5 mg Oral Once   metoprolol succinate  25 mg Oral Daily   multivitamin with minerals   Oral Daily   pantoprazole  40 mg Oral Daily   polyethylene glycol  17 g Oral Daily   potassium chloride SA  60 mEq Oral BID   rosuvastatin  10 mg Oral QPM   senna-docusate  1 tablet  Oral BID   sodium chloride flush  3 mL Intravenous Q12H   sodium chloride flush  3 mL Intravenous Q12H   sodium chloride flush  3 mL Intravenous Q12H   sodium chloride flush  3 mL Intravenous Q12H    Infusions:  sodium chloride     sodium chloride     sodium chloride     sodium chloride 10 mL/hr at 02/20/22 0446   furosemide 160 mg (02/21/22 0833)    PRN Medications: sodium chloride, sodium chloride, sodium chloride, acetaminophen, albuterol, nitroGLYCERIN, ondansetron (ZOFRAN) IV, oxyCODONE, sodium chloride flush, sodium chloride flush, sodium chloride flush    Patient Profile   66 y.o. female with history of chronic biventricular systolic CHF, PAF/AFL, CKD IV, CAD, valvular heart disease, DMII, OSA. Multiple admissions for decompensated HF mostly in setting of recurrent AF.    Now presenting with a/c CHF.   Assessment/Plan     1. Acute on chronic Biventricular Systolic Heart Failure: - RHC (6/22) with low filling pressures with moderately reduced CO. - Echo (7/22) in setting of AFL with RVR, showed EF 30-35%, moderate LVH, moderate RV dysfunction, PASP 48, s/p MV repair with mild MR and mean gradient 10 mmHg with HR around 130, s/p TV repair with mild-moderate TR, dilated IVC.   - RHC 4/23 markedly elevated biventricular pressures - Echo 4/23 EF 35-40% RV moderately down severe TR. Moderate MS mean grad 67m HG - TEE 01/20/22: Normal LV EF 55%, severe RV dysfunction, trivial MR, mean gradient 3 across MV prosthesis, severe TR - Multiple admissions recently for HF mostly occur in setting of recurrent AF. Currently in NSR. - RHC 06/27 markedly elevated biventricular pressures, low PAPi, PA sats low but preserved Fick CO. - NYHA IIIb/IV. Diuresing well but still seems volume overloaded with JVP up.  Yesterday had IV lasix 160 BID + 2.5 mg metolazone. Repeat this regimen again today. Creatinine fairly stable 3.29 => 3.22.  - In and out of AFL at recent visits. Maintaining SR this  admit. - UNNA boots - GDMT limited by CKD.  - No Bidil for now.  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 Remains volume overloaded, continue diuresis as above.  I worry that she may end up with dialysis.  - Repeat RHC prior to discharge once she has been diuresed further => now arranged Wednesday, 07/05. Discussed risks/benefits and she agrees to procedure.  - Avoid milrinone with propensity for AF.  2. PAF/AFL - She has failed Tikosyn. - She had Maze in 5/22 and DCCV in 6/22 & 04/19/21, last of which she had ERAF. - Multiple admissions recently for HF mostly occur in setting of recurrent AF/AFL. Last DCCV 06/13.  - Currently maintaining SR - Continue amiodarone + Eliquis  - Unable to tolerate AF at all. Follows with EP. Not a candidate for Afib ablation or AV nodal ablation/PPM - Has OSA, awaiting split night study for CPAP titration.  - Keep K > 4.0 and Mg > 2.0 (replete today).    3. CKD Stage IV   - Nephrology consulted.  - Baseline Scr likely 3-3.5, recent Scr 2-2.5 likely associated with dilution in setting of volume overload. - Scr 3.0 => 3.3 => 3.1 => 3.29 => 3.22 - Continue with aggressive diuresis. I worry that she will eventually need HD for volume management.  - No SGLT2i, GFR has been consistently less than 20   4. CAD - Cath (2019): Mild nonobstructive CAD - No chest pain.  - Continue statin + beta blocker - no ASA w/ Eliquis    5. Valvular Heart Disease: - S/p MV and TV repair in 5/22.  - Echo 02/25/21 with mild-moderate TR.   - Echo 4/23 35-40% RV moderately down. severe TR. Moderate MS mean gradient 34mHG  - TEE 06/23: LV EF 55% with severe RV dysfunction, trivial MR, mean gradient 3 across MV prosthesis, severe TR - Seen by structural heart team. Has been turned down for TV valve in ring due to markedly elevated wedge pressures despite diuresis.   6. Obesity - Body mass index is 48 - Discussed portion  control.    7. DMII - Hgb A1c 6.7% 04/23 - GFR has been too low for SGLT2i   8. OSA - initial sleep study positive but was awaiting split night study to titrate CPAP - Has been referred to Dr. TRadford Pax- suspect this is part of her AF recurrence   9. PVCs - Burden improved on tele this am - Continue PO amiodarone  - Keep K > 4 and Mag > 2  10. Bacteriuria - Urine culture grew E.Coli and Enterobacter cloacae - Not currently treated d/t lack of symptoms  11. Anemia - Hgb 8s-9s - Iron stores low - Feraheme given.   Continue to mobilize.  PT/OT.  Length of Stay: [place] MD  02/21/2022, 9:18 AM  Advanced Heart Failure Team Pager [phone](M-F; 7a - 5p)  Please contact [email or web address] for night-coverage after hours (5p -7a ) and weekends on [email or web address]

## 2022-02-22 ENCOUNTER — Encounter (HOSPITAL_COMMUNITY)
Admission: EM | Disposition: A | Discharge status: Home / Self Care | Payer: Self-pay | Source: Home / Self Care | Attending: Internal Medicine

## 2022-02-22 DIAGNOSIS — I50813 Acute on chronic right heart failure: Secondary | ICD-10-CM | POA: Diagnosis not present

## 2022-02-22 HISTORY — PX: RIGHT HEART CATH: CATH118263

## 2022-02-22 LAB — BASIC METABOLIC PANEL
Anion gap: 13 (ref 5–15)
BUN: 56 mg/dL — ABNORMAL HIGH (ref 8–23)
CO2: 33 mmol/L — ABNORMAL HIGH (ref 22–32)
Calcium: 9.6 mg/dL (ref 8.9–10.3)
Chloride: 94 mmol/L — ABNORMAL LOW (ref 98–111)
Creatinine, Ser: 3.19 mg/dL — ABNORMAL HIGH (ref 0.44–1.00)
GFR, Estimated: 16 mL/min — ABNORMAL LOW (ref >60)
Glucose, Bld: 121 mg/dL — ABNORMAL HIGH (ref 70–99)
Potassium: 3.7 mmol/L (ref 3.5–5.1)
Sodium: 140 mmol/L (ref 135–145)

## 2022-02-22 LAB — GLUCOSE, CAPILLARY
Glucose-Capillary: 105 mg/dL — ABNORMAL HIGH (ref 70–99)
Glucose-Capillary: 103 mg/dL — ABNORMAL HIGH (ref 70–99)
Glucose-Capillary: 104 mg/dL — ABNORMAL HIGH (ref 70–99)
Glucose-Capillary: 118 mg/dL — ABNORMAL HIGH (ref 70–99)

## 2022-02-22 SURGERY — RIGHT HEART CATH
Anesthesia: LOCAL

## 2022-02-22 MED ORDER — HEPARIN (PORCINE) IN NACL 1000-0.9 UT/500ML-% IV SOLN
INTRAVENOUS | Status: AC
  Filled 2022-02-22: qty 1000

## 2022-02-22 MED ORDER — DEXTROSE 5 % IV SOLN
160.0000 mg | Freq: Once | INTRAVENOUS | Status: AC
  Administered 2022-02-22: 160 mg via INTRAVENOUS
  Filled 2022-02-22: qty 10

## 2022-02-22 MED ORDER — LIDOCAINE HCL (PF) 1 % IJ SOLN
INTRAMUSCULAR | Status: DC | PRN
  Administered 2022-02-22: 2 mL

## 2022-02-22 MED ORDER — APIXABAN 5 MG PO TABS
5.0000 mg | ORAL_TABLET | Freq: Two times a day (BID) | ORAL | Status: DC
  Administered 2022-02-22 – 2022-02-24 (×5): 5 mg via ORAL
  Filled 2022-02-22 (×5): qty 1

## 2022-02-22 MED ORDER — FUROSEMIDE 10 MG/ML IJ SOLN
160.0000 mg | Freq: Once | INTRAVENOUS | Status: AC
  Administered 2022-02-22: 160 mg via INTRAVENOUS
  Filled 2022-02-22: qty 4
  Filled 2022-02-22: qty 16

## 2022-02-22 MED ORDER — LIDOCAINE HCL (PF) 1 % IJ SOLN
INTRAMUSCULAR | Status: AC
  Filled 2022-02-22: qty 30

## 2022-02-22 MED ORDER — HEPARIN (PORCINE) IN NACL 1000-0.9 UT/500ML-% IV SOLN
INTRAVENOUS | Status: AC
  Filled 2022-02-22: qty 500

## 2022-02-22 MED ORDER — VERAPAMIL HCL 2.5 MG/ML IV SOLN
INTRAVENOUS | Status: AC
  Filled 2022-02-22: qty 2

## 2022-02-22 MED ORDER — MIDAZOLAM HCL 2 MG/2ML IJ SOLN
INTRAMUSCULAR | Status: AC
  Filled 2022-02-22: qty 2

## 2022-02-22 MED ORDER — FENTANYL CITRATE (PF) 100 MCG/2ML IJ SOLN
INTRAMUSCULAR | Status: AC
  Filled 2022-02-22: qty 2

## 2022-02-22 MED ORDER — HEPARIN (PORCINE) IN NACL 1000-0.9 UT/500ML-% IV SOLN
INTRAVENOUS | Status: DC | PRN
  Administered 2022-02-22: 500 mL

## 2022-02-22 SURGICAL SUPPLY — 6 items
CATH BALLN WEDGE 5F 110CM (CATHETERS) ×1 IMPLANT
GUIDEWIRE .025 260CM (WIRE) ×1 IMPLANT
KIT HEART LEFT (KITS) ×2 IMPLANT
PACK CARDIAC CATHETERIZATION (CUSTOM PROCEDURE TRAY) ×2 IMPLANT
SHEATH GLIDE SLENDER 4/5FR (SHEATH) ×1 IMPLANT
TRANSDUCER W/STOPCOCK (MISCELLANEOUS) ×2 IMPLANT

## 2022-02-22 NOTE — Progress Notes (Signed)
Pt will self administer her CPAP if she decides to wear it.

## 2022-02-22 NOTE — Interval H&P Note (Signed)
History and Physical Interval Note:  02/22/2022 2:28 PM  [name]  has presented today for surgery, with the diagnosis of heart failure.  The various methods of treatment have been discussed with the patient and family. After consideration of risks, benefits and other options for treatment, the patient has consented to  Procedure(s): RIGHT HEART CATH (N/A) as a surgical intervention.  The patient's history has been reviewed, patient examined, no change in status, stable for surgery.  I have reviewed the patient's chart and labs.  Questions were answered to the patient's satisfaction.     [name] [employer]

## 2022-02-22 NOTE — Progress Notes (Signed)
PROGRESS NOTE  [name]  OTL:[id number] DOB: [birth year] DOA: 02/12/2022 PCP: [name], NP   Brief Narrative:  Patient is a 66 year old female with history of systolic congestive heart failure, severe right ventricular failure, status post mitral valve repair, severe tricuspid regurgitation, chronic A-fib, CKD stage IV who presented here with orthopnea, PND, weight gain and found to have fluid overload.  Currently on IV diuresis.  Cardiology, nephrology following.  Plan for right heart cath on Wednesday  Assessment & Plan:  Principal Problem:   Acute on chronic right heart failure (HCC) Active Problems:   AF (paroxysmal atrial fibrillation) (HCC)   Essential hypertension   Diabetes mellitus type 2 in obese (HCC)   Type 2 diabetes mellitus with hyperlipidemia ([place])   Depression   Class 3 obesity (HCC)   NICM (nonischemic cardiomyopathy) ([place])   S/P mitral valve repair   Prolonged QT interval   Anxiety and depression   GERD (gastroesophageal reflux disease)  Acute on chronic biventricular systolic heart failure : Echocardiogram on 6/23 showed normal ejection fraction.  Right heart cath on 6/27 showed elevated biventricular pressure.  Presented with fluid overload, orthopnea, PND.  Currently on Lasix 160 mg twice a day along with metolazone.  Being followed by advanced heart failure team.  Continue metoprolol.  Monitor BMP.  Having good diuresis.  Remains on room air. Cardiology planning for repeat right heart cath today  Paroxysmal A-fib: Failed Tikosyn.  Status post maze procedure on 5/22.  Underwent multiple cardioversions.  Currently in normal sinus rhythm.  Continue amiodarone, on Eliquis for anticoagulation.  CKD stage IV: Currently kidney function around baseline.  Monitor volume status.  Monitor BMP.  Nephrology following.  Normocytic anemia: Most likely in the setting of chronic kidney disease.  Iron studies showed low iron.  Given IV iron  Coronary disease: No  anginal symptoms at present.  Continue statin, beta-blocker.  Diabetes type 2: On Trulicity at home.  Continue current insulin regimen.  Monitor blood sugars  Depression/anxiety: On Cymbalta, BuSpar  History of sleep apnea: Not on CPAP.  Seen in the process of getting a new sleep study  Asymptomatic bacteriuria: Urine culture from 6/26 showed E. coli. Urinary symptoms.  Deferred treatment   Morbid obesity: BMI  of 45            DVT prophylaxis: apixaban (ELIQUIS) tablet 5 mg     Code Status: Full Code  Family Communication: None at bedside  Patient status:Inpatient  Patient is from :Home  Anticipated discharge HR:CBUL  Estimated DC date:1-2 days.  Needs cardiology clearance   Consultants: Cardiology, nephrology  Procedures: None  Antimicrobials:  Anti-infectives (From admission, onward)    None       Subjective:  Patient seen and examined at bedside this morning.  Hemodynamically stable without any complaints.  On room air.  Waiting for cardiac cath today.  Objective: Vitals:   02/21/22 1623 02/21/22 1927 02/22/22 0425 02/22/22 1101  BP: 123/72 123/67 119/68 107/72  Pulse: 84 84 86 82  Resp: '20 20 18 17  '$ Temp: 97.6 F (36.4 C) 97.6 F (36.4 C) 98.5 F (36.9 C)   TempSrc: Oral Oral Oral Oral  SpO2: 100% 94% 93% 94%  Weight:   123.7 kg   Height:        Intake/Output Summary (Last 24 hours) at 02/22/2022 1107 Last data filed at 02/22/2022 0431 Gross per 24 hour  Intake 150 ml  Output 3050 ml  Net -2900 ml   Filed  Weights   02/20/22 0437 02/21/22 0354 02/22/22 0425  Weight: 126.6 kg 126.1 kg 123.7 kg    Examination:  General exam: Overall comfortable, not in distress, obese HEENT: PERRL Respiratory system:  no wheezes or crackles  Cardiovascular system: S1 & S2 heard, RRR.  Gastrointestinal system: Abdomen is nondistended, soft and nontender. Central nervous system: Alert and oriented Extremities: Bilateral lower extremity pitting edema,  no clubbing ,no cyanosis Skin: No rashes, no ulcers,no icterus     Data Reviewed: I have personally reviewed following labs and imaging studies  CBC: Recent Labs  Lab 02/17/22 0252 02/20/22 0727  WBC 5.0 5.1  HGB 8.4* 8.9*  HCT 28.1* 29.0*  MCV 83.1 83.8  PLT 224 595   Basic Metabolic Panel: Recent Labs  Lab 02/16/22 0618 02/17/22 0252 02/18/22 0240 02/19/22 0429 02/20/22 0727 02/21/22 0144 02/22/22 0200  NA 139 138 142 140 140 137 140  K 3.8 4.0 3.8 3.5 4.1 3.9 3.7  CL 103 100 99 98 100 96* 94*  CO2 '27 30 30 31 '$ 34* 31 33*  GLUCOSE 104* 129* 119* 99 90 121* 121*  BUN 33* 37* 42* 42* 44* 51* 56*  CREATININE 2.90* 3.01* 3.33* 3.10* 3.29* 3.22* 3.19*  CALCIUM 9.0 9.3 9.4 9.3 9.3 9.4 9.6  MG 1.9 2.1  --   --   --  2.3  --      Recent Results (from the past 240 hour(s))  SARS Coronavirus 2 by RT PCR (hospital order, performed in [facility] hospital lab) *cepheid single result test* Anterior Nasal Swab     Status: None   Collection Time: 02/12/22 12:26 PM   Specimen: Anterior Nasal Swab  Result Value Ref Range Status   SARS Coronavirus 2 by RT PCR NEGATIVE NEGATIVE Final    Comment: (NOTE) SARS-CoV-2 target nucleic acids are NOT DETECTED.  The SARS-CoV-2 RNA is generally detectable in upper and lower respiratory specimens during the acute phase of infection. The lowest concentration of SARS-CoV-2 viral copies this assay can detect is 250 copies / mL. A negative result does not preclude SARS-CoV-2 infection and should not be used as the sole basis for treatment or other patient management decisions.  A negative result may occur with improper specimen collection / handling, submission of specimen other than nasopharyngeal swab, presence of viral mutation(s) within the areas targeted by this assay, and inadequate number of viral copies (<250 copies / mL). A negative result must be combined with clinical observations, patient history, and epidemiological  information.  Fact Sheet for Patients:   https://www.patel.info/  Fact Sheet for Healthcare Providers: https://hall.com/  This test is not yet approved or  cleared by the [place] FDA and has been authorized for detection and/or diagnosis of SARS-CoV-2 by FDA under an Emergency Use Authorization (EUA).  This EUA will remain in effect (meaning this test can be used) for the duration of the COVID-19 declaration under Section 564(b)(1) of the Act, 21 U.S.C. section 360bbb-3(b)(1), unless the authorization is terminated or revoked sooner.  Performed at [facility], [address] [place], [place] [address]   Urine Culture     Status: Abnormal   Collection Time: 02/13/22  8:09 AM   Specimen: Urine, Clean Catch  Result Value Ref Range Status   Specimen Description URINE, CLEAN CATCH  Final   Special Requests   Final    NONE Performed at [facility], [place] [address] [place],  [address]    Culture (A)  Final    >=  100,000 COLONIES/mL ESCHERICHIA COLI 60,000 COLONIES/mL ENTEROBACTER CLOACAE    Report Status 02/15/2022 FINAL  Final   Organism ID, Bacteria ESCHERICHIA COLI (A)  Final   Organism ID, Bacteria ENTEROBACTER CLOACAE (A)  Final      Susceptibility   Enterobacter cloacae - MIC*    CEFAZOLIN >=64 RESISTANT Resistant     CEFEPIME 2 SENSITIVE Sensitive     CIPROFLOXACIN <=0.25 SENSITIVE Sensitive     GENTAMICIN <=1 SENSITIVE Sensitive     IMIPENEM 0.5 SENSITIVE Sensitive     NITROFURANTOIN 32 SENSITIVE Sensitive     TRIMETH/SULFA <=20 SENSITIVE Sensitive     PIP/TAZO >=128 RESISTANT Resistant     * 60,000 COLONIES/mL ENTEROBACTER CLOACAE   Escherichia coli - MIC*    AMPICILLIN 4 SENSITIVE Sensitive     CEFAZOLIN <=4 SENSITIVE Sensitive     CEFEPIME <=0.12 SENSITIVE Sensitive     CEFTRIAXONE <=0.25 SENSITIVE Sensitive     CIPROFLOXACIN <=0.25 SENSITIVE Sensitive     GENTAMICIN <=1  SENSITIVE Sensitive     IMIPENEM <=0.25 SENSITIVE Sensitive     NITROFURANTOIN 32 SENSITIVE Sensitive     TRIMETH/SULFA <=20 SENSITIVE Sensitive     AMPICILLIN/SULBACTAM <=2 SENSITIVE Sensitive     PIP/TAZO <=4 SENSITIVE Sensitive     * >=100,000 COLONIES/mL ESCHERICHIA COLI     Radiology Studies: No results found.  Scheduled Meds:  amiodarone  200 mg Oral Daily   apixaban  5 mg Oral BID   busPIRone  5 mg Oral BID   colchicine  0.3 mg Oral Daily   cycloSPORINE  1 drop Both Eyes BID   docusate sodium  100 mg Oral BID   DULoxetine  60 mg Oral Daily   fluticasone  2 spray Each Nare Daily   insulin aspart  0-9 Units Subcutaneous TID WC   loratadine  10 mg Oral Daily   metoprolol succinate  25 mg Oral Daily   multivitamin with minerals   Oral Daily   pantoprazole  40 mg Oral Daily   polyethylene glycol  17 g Oral Daily   potassium chloride SA  60 mEq Oral BID   rosuvastatin  10 mg Oral QPM   senna-docusate  1 tablet Oral BID   sodium chloride flush  3 mL Intravenous Q12H   sodium chloride flush  3 mL Intravenous Q12H   sodium chloride flush  3 mL Intravenous Q12H   sodium chloride flush  3 mL Intravenous Q12H   sodium chloride flush  3 mL Intravenous Q12H   Continuous Infusions:  sodium chloride     sodium chloride     sodium chloride     sodium chloride 10 mL/hr at 02/20/22 0446   sodium chloride     sodium chloride 10 mL/hr at 02/22/22 0628     LOS: 10 days   Shelly Coss, MD [facility] P7/12/2021, 11:07 AM

## 2022-02-22 NOTE — Progress Notes (Addendum)
Patient ID: [name], female   DOB: [birth year], 66 y.o.   MRN: [id number]     Advanced Heart Failure Rounding Note  PCP-Cardiologist: Buford Dresser, MD   Subjective:    RHC 06/27 Markedly elevated biventricular filling pressures, PA sat low but fick CO preserved Findings:   RA = 27  RV = 62/25 PA = 60/27 (41) PCW = 25 (v = 35) Fick cardiac output/index = 6.8/2.8 PVR = 2.4 WU Ao sat = 90% PA sat = 45%, 49% PAPi = 1.2  Continues to diurese well with IV lasix 160 BID + 2.5 mg metolazone. 4.2L UOP charted yesterday.   Weidght down another 6 lb.  Creatinine 3.01 => 3.3 => 3.1 => 3.29 => 3.22 => 3.19.  Maintaining SR.  Dyspnea and abdominal bloating improved, feels like most of fluid is off. Reports she has been ambulating.    Objective:   Weight Range: 123.7 kg Body mass index is 42.7 kg/m.   Vital Signs:   Temp:  [97.6 F (36.4 C)-98.5 F (36.9 C)] 98.5 F (36.9 C) (07/05 0425) Pulse Rate:  [83-87] 86 (07/05 0425) Resp:  [16-20] 18 (07/05 0425) BP: (118-134)/(67-94) 119/68 (07/05 0425) SpO2:  [93 %-100 %] 93 % (07/05 0425) Weight:  [123.7 kg] 123.7 kg (07/05 0425) Last BM Date : 02/20/22  Weight change: Filed Weights   02/20/22 0437 02/21/22 0354 02/22/22 0425  Weight: 126.6 kg 126.1 kg 123.7 kg    Intake/Output:   Intake/Output Summary (Last 24 hours) at 02/22/2022 0752 Last data filed at 02/22/2022 0431 Gross per 24 hour  Intake 225 ml  Output 4250 ml  Net -4025 ml      Physical Exam    General:  Well appearing. Sitting up in bed. HEENT: normal Neck: supple. JVP 10-12. Carotids 2+ bilat; no bruits.  Cor: PMI nondisplaced. Regular rate & rhythm. No rubs, gallops, 3/6 HSM best heard at apex Lungs: diminished Abdomen: obese, soft, nontender, nondistended.  Extremities: no cyanosis, clubbing, rash, edema, + UNNA boots Neuro: alert & orientedx3, cranial nerves grossly intact. moves all 4 extremities w/o difficulty. Affect  pleasant   Telemetry   SR 80s, ~ 10 PVCs/min   EKG    N/A   Labs    CBC Recent Labs    02/20/22 0727  WBC 5.1  HGB 8.9*  HCT 29.0*  MCV 83.8  PLT 283    Basic Metabolic Panel Recent Labs    02/21/22 0144 02/22/22 0200  NA 137 140  K 3.9 3.7  CL 96* 94*  CO2 31 33*  GLUCOSE 121* 121*  BUN 51* 56*  CREATININE 3.22* 3.19*  CALCIUM 9.4 9.6  MG 2.3  --    Liver Function Tests No results for input(s): "AST", "ALT", "ALKPHOS", "BILITOT", "PROT", "ALBUMIN" in the last 72 hours. No results for input(s): "LIPASE", "AMYLASE" in the last 72 hours. Cardiac Enzymes No results for input(s): "CKTOTAL", "CKMB", "CKMBINDEX", "TROPONINI" in the last 72 hours.  BNP: BNP (last 3 results) Recent Labs    01/26/22 1156 02/06/22 1809 02/12/22 1226  BNP 375.6* 550.8* 517.3*    ProBNP (last 3 results) No results for input(s): "PROBNP" in the last 8760 hours.   D-Dimer No results for input(s): "DDIMER" in the last 72 hours. Hemoglobin A1C No results for input(s): "HGBA1C" in the last 72 hours. Fasting Lipid Panel No results for input(s): "CHOL", "HDL", "LDLCALC", "TRIG", "CHOLHDL", "LDLDIRECT" in the last 72 hours. Thyroid Function Tests No results for input(s): "TSH", "T4TOTAL", "  T3FREE", "THYROIDAB" in the last 72 hours.  Invalid input(s): "FREET3"  Other results:   Imaging    No results found.   Medications:     Scheduled Medications:  amiodarone  200 mg Oral Daily   apixaban  5 mg Oral BID   busPIRone  5 mg Oral BID   colchicine  0.3 mg Oral Daily   cycloSPORINE  1 drop Both Eyes BID   docusate sodium  100 mg Oral BID   DULoxetine  60 mg Oral Daily   fluticasone  2 spray Each Nare Daily   insulin aspart  0-9 Units Subcutaneous TID WC   loratadine  10 mg Oral Daily   metoprolol succinate  25 mg Oral Daily   multivitamin with minerals   Oral Daily   pantoprazole  40 mg Oral Daily   polyethylene glycol  17 g Oral Daily   potassium chloride SA  60  mEq Oral BID   rosuvastatin  10 mg Oral QPM   senna-docusate  1 tablet Oral BID   sodium chloride flush  3 mL Intravenous Q12H   sodium chloride flush  3 mL Intravenous Q12H   sodium chloride flush  3 mL Intravenous Q12H   sodium chloride flush  3 mL Intravenous Q12H   sodium chloride flush  3 mL Intravenous Q12H    Infusions:  sodium chloride     sodium chloride     sodium chloride     sodium chloride 10 mL/hr at 02/20/22 0446   sodium chloride     sodium chloride 10 mL/hr at 02/22/22 1937   furosemide 160 mg (02/21/22 1721)    PRN Medications: sodium chloride, sodium chloride, sodium chloride, sodium chloride, acetaminophen, albuterol, nitroGLYCERIN, ondansetron (ZOFRAN) IV, oxyCODONE, sodium chloride flush, sodium chloride flush, sodium chloride flush, sodium chloride flush    Patient Profile   66 y.o. female with history of chronic biventricular systolic CHF, PAF/AFL, CKD IV, CAD, valvular heart disease, DMII, OSA. Multiple admissions for decompensated HF mostly in setting of recurrent AF.    Now presenting with a/c CHF.   Assessment/Plan     1. Acute on chronic Biventricular Systolic Heart Failure: - RHC (6/22) with low filling pressures with moderately reduced CO. - Echo (7/22) in setting of AFL with RVR, showed EF 30-35%, moderate LVH, moderate RV dysfunction, PASP 48, s/p MV repair with mild MR and mean gradient 10 mmHg with HR around 130, s/p TV repair with mild-moderate TR, dilated IVC.   - RHC 4/23 markedly elevated biventricular pressures - Echo 4/23 EF 35-40% RV moderately down severe TR. Moderate MS mean grad 87m HG - TEE 01/20/22: Normal LV EF 55%, severe RV dysfunction, trivial MR, mean gradient 3 across MV prosthesis, severe TR - Multiple admissions recently for HF mostly occur in setting of recurrent AF. Currently in NSR. - RHC 06/27 markedly elevated biventricular pressures, low PAPi, PA sats low but preserved Fick CO. - NYHA IIIb/IV. Diuresing well but  still seems somewhat volume overloaded.  Yesterday had IV lasix 160 BID + 2.5 mg metolazone. Going for RHC today. Will give 160 mg lasix IV pre cath and decide on additional diuretics depending on cath results. Creatinine fairly stable 3.29 => 3.22>3.19.  - In and out of AFL at recent visits. Maintaining SR this admit. - UNNA boots - GDMT limited by CKD.  - No Bidil for now.  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 Remains volume overloaded, continue diuresis as above.  I worry that she may end up with dialysis.  - Avoid milrinone with propensity for AF.    2. PAF/AFL - She has failed Tikosyn. - She had Maze in 5/22 and DCCV in 6/22 & 04/19/21, last of which she had ERAF. - Multiple admissions recently for HF mostly occur in setting of recurrent AF/AFL. Last DCCV 06/13.  - Currently maintaining SR - Continue amiodarone + Eliquis  - Unable to tolerate AF at all. Follows with EP. Not a candidate for Afib ablation or AV nodal ablation/PPM - Has OSA, awaiting split night study for CPAP titration.  - Keep K > 4.0 and Mg > 2.0. Supp K today.   3. CKD Stage IV   - Nephrology consulted.  - Baseline Scr likely 3-3.5, recent Scr 2-2.5 likely associated with dilution in setting of volume overload. - Scr 3.0 => 3.3 => 3.1 => 3.29 => 3.22 => 3.19 - Continue with aggressive diuresis. I worry that she will eventually need HD for volume management.  - No SGLT2i, GFR has been consistently less than 20   4. CAD - Cath (2019): Mild nonobstructive CAD - No chest pain.  - Continue statin + beta blocker - no ASA w/ Eliquis    5. Valvular Heart Disease: - S/p MV and TV repair in 5/22.  - Echo 02/25/21 with mild-moderate TR.   - Echo 4/23 35-40% RV moderately down. severe TR. Moderate MS mean gradient 39mHG  - TEE 06/23: LV EF 55% with severe RV dysfunction, trivial MR, mean gradient 3 across MV prosthesis, severe TR - Seen by structural heart team.  Has been turned down for TV valve in ring due to markedly elevated wedge pressures despite diuresis.   6. Obesity - Body mass index is 48 - Discussed portion control.    7. DMII - Hgb A1c 6.7% 04/23 - GFR has been too low for SGLT2i   8. OSA - initial sleep study positive but was awaiting split night study to titrate CPAP - Has been referred to Dr. TRadford Pax- suspect this is part of her AF recurrence   9. PVCs - ~ 10 PVCs/min on tele - Continue PO amiodarone  - Keep K > 4 and Mag > 2  10. Bacteriuria - Urine culture grew E.Coli and Enterobacter cloacae - Not currently treated d/t lack of symptoms  11. Anemia - Hgb 8s-9s - Iron stores low - Feraheme given.   Encouraged her to mobilize. Outpatient PT recommended at discharge.   Length of Stay: 10  FIN[facility], LLynder Parents PA-C  02/22/2022, 7:52 AM  Advanced Heart Failure Team Pager [phone](M-F; 7a - 5p)  Please contact [facility] for night-coverage after hours (5p -7a ) and weekends on [email or web address]   Patient seen with PA, agree with the above note.   RHC done today: Procedural Findings: Hemodynamics (mmHg) RA mean 11 RV 48/10 PA 48/15, mean 28 PCWP mean 14 Oxygen saturations: PA 59% AO 95% Cardiac Output (Fick) 7.05  Cardiac Index (Fick) 3.05 PVR 1.99 WU PAPi 3  Weight down 6 more lbs, creatinine stable at 3.19.   General: NAD Neck: Thick. No JVD, no thyromegaly or thyroid nodule.  Lungs: Clear to auscultation bilaterally with normal respiratory effort. CV: Nondisplaced PMI.  Heart regular S1/S2, no S3/S4, no murmur. Trace ankle edema.  Abdomen: Soft, nontender, no hepatosplenomegaly, no distention.  Skin: Intact without lesions or rashes.  Neurologic: Alert and oriented x 3.  Psych:  Normal affect. Extremities: No clubbing or cyanosis.  HEENT: Normal.   Volume status appears nearly optimized at this point, RA pressure still mildly elevated.  - I will give 1 more dose of Lasix 160 mg IV this evening.   -  Transition to po tomorrow.   Hopefully home soon.   Loralie Champagne 02/22/2022 3:01 PM

## 2022-02-22 NOTE — Care Management Important Message (Signed)
Important Message  Patient Details  Name: [name] MRN: [id number] Date of Birth: Nov 01, 1955   Medicare Important Message Given:  Yes     Shelda Altes 02/22/2022, 7:53 AM

## 2022-02-22 NOTE — Progress Notes (Signed)
Patient ID: [name], female   DOB: 05-16-1956, 66 y.o.   MRN: [id number] [facility] Progress Note   Assessment/ Plan:   1. Acute kidney Injury on chronic kidney disease stage IV: Baseline creatinine likely 3.0-3.5 (with lower creatinine likely an effect of dilution/hypovolemia).  Again with good response to diuretics with 4.2 L urine output (net -4 L with corresponding weight loss of 3 kg).  Awaiting right heart catheterization today to decide on additional diuretic adjustments.  Given continued response to diuresis and stable/improving renal function, there are no indications for dialysis at this time. 2.  Acute exacerbation of systolic heart failure: With recurrent hospitalizations for CHF exacerbation and improving volume status on intravenous furosemide with p.o. metolazone (takes torsemide/metolazone as an outpatient).  Right heart catheterization anticipated today.  She unfortunately does not know her EDW. 3.  Atrial fibrillation: Appears to be successfully rate controlled on metoprolol. 4.  Anemia: Likely secondary to chronic kidney disease as well as iron deficiency for which she is on intravenous iron supplementation.  Subjective:   Reports to be feeling better this morning with no chest pain or shortness of breath.   Objective:   BP 119/68 (BP Location: Right Arm)   Pulse 86   Temp 98.5 F (36.9 C) (Oral)   Resp 18   Ht '5\' 7"'$  (1.702 m)   Wt 123.7 kg   SpO2 93%   BMI 42.70 kg/m   Intake/Output Summary (Last 24 hours) at 02/22/2022 0739 Last data filed at 02/22/2022 0431 Gross per 24 hour  Intake 225 ml  Output 4250 ml  Net -4025 ml   Weight change: -2.495 kg  Physical Exam: Gen: Comfortably laying flat in bed CVS: Pulse regular rhythm, normal rate, S1 and S2 normal Resp: Diminished breath sounds over bases, no distinct rales or rhonchi Abd: Soft, obese, nontender, bowel sounds normal Ext: 1-2+ lower extremity edema, 1+ upper extremity and pitting edema  over her lower back.  Imaging: No results found.  Labs: BMET Recent Labs  Lab 02/16/22 0618 02/17/22 0252 02/18/22 0240 02/19/22 0429 02/20/22 0727 02/21/22 0144 02/22/22 0200  NA 139 138 142 140 140 137 140  K 3.8 4.0 3.8 3.5 4.1 3.9 3.7  CL 103 100 99 98 100 96* 94*  CO2 '27 30 30 31 '$ 34* 31 33*  GLUCOSE 104* 129* 119* 99 90 121* 121*  BUN 33* 37* 42* 42* 44* 51* 56*  CREATININE 2.90* 3.01* 3.33* 3.10* 3.29* 3.22* 3.19*  CALCIUM 9.0 9.3 9.4 9.3 9.3 9.4 9.6   CBC Recent Labs  Lab 02/17/22 0252 02/20/22 0727  WBC 5.0 5.1  HGB 8.4* 8.9*  HCT 28.1* 29.0*  MCV 83.1 83.8  PLT 224 217    Medications:     amiodarone  200 mg Oral Daily   apixaban  5 mg Oral BID   busPIRone  5 mg Oral BID   colchicine  0.3 mg Oral Daily   cycloSPORINE  1 drop Both Eyes BID   docusate sodium  100 mg Oral BID   DULoxetine  60 mg Oral Daily   fluticasone  2 spray Each Nare Daily   insulin aspart  0-9 Units Subcutaneous TID WC   loratadine  10 mg Oral Daily   metoprolol succinate  25 mg Oral Daily   multivitamin with minerals   Oral Daily   pantoprazole  40 mg Oral Daily   polyethylene glycol  17 g Oral Daily   potassium chloride SA  60 mEq  Oral BID   rosuvastatin  10 mg Oral QPM   senna-docusate  1 tablet Oral BID   sodium chloride flush  3 mL Intravenous Q12H   sodium chloride flush  3 mL Intravenous Q12H   sodium chloride flush  3 mL Intravenous Q12H   sodium chloride flush  3 mL Intravenous Q12H   sodium chloride flush  3 mL Intravenous Q12H    Elmarie Shiley, MD 02/22/2022, 7:39 AM

## 2022-02-23 ENCOUNTER — Encounter (HOSPITAL_COMMUNITY): Payer: Self-pay | Admitting: Cardiology

## 2022-02-23 DIAGNOSIS — I50813 Acute on chronic right heart failure: Secondary | ICD-10-CM | POA: Diagnosis not present

## 2022-02-23 LAB — POCT I-STAT EG7
Acid-Base Excess: 11 mmol/L — ABNORMAL HIGH (ref 0.0–2.0)
Acid-Base Excess: 12 mmol/L — ABNORMAL HIGH (ref 0.0–2.0)
Bicarbonate: 36.7 mmol/L — ABNORMAL HIGH (ref 20.0–28.0)
Bicarbonate: 37.9 mmol/L — ABNORMAL HIGH (ref 20.0–28.0)
Calcium, Ion: 1.2 mmol/L (ref 1.15–1.40)
Calcium, Ion: 1.23 mmol/L (ref 1.15–1.40)
HCT: 30 % — ABNORMAL LOW (ref 36.0–46.0)
HCT: 31 % — ABNORMAL LOW (ref 36.0–46.0)
Hemoglobin: 10.2 g/dL — ABNORMAL LOW (ref 12.0–15.0)
Hemoglobin: 10.5 g/dL — ABNORMAL LOW (ref 12.0–15.0)
O2 Saturation: 58 %
O2 Saturation: 60 %
Potassium: 4.1 mmol/L (ref 3.5–5.1)
Potassium: 4.1 mmol/L (ref 3.5–5.1)
Sodium: 141 mmol/L (ref 135–145)
Sodium: 142 mmol/L (ref 135–145)
TCO2: 38 mmol/L — ABNORMAL HIGH (ref 22–32)
TCO2: 40 mmol/L — ABNORMAL HIGH (ref 22–32)
pCO2, Ven: 53.2 mmHg (ref 44–60)
pCO2, Ven: 54.6 mmHg (ref 44–60)
pH, Ven: 7.448 — ABNORMAL HIGH (ref 7.25–7.43)
pH, Ven: 7.449 — ABNORMAL HIGH (ref 7.25–7.43)
pO2, Ven: 30 mmHg — CL (ref 32–45)
pO2, Ven: 31 mmHg — CL (ref 32–45)

## 2022-02-23 LAB — CBC
HCT: 31.2 % — ABNORMAL LOW (ref 36.0–46.0)
Hemoglobin: 9.5 g/dL — ABNORMAL LOW (ref 12.0–15.0)
MCH: 25.5 pg — ABNORMAL LOW (ref 26.0–34.0)
MCHC: 30.4 g/dL (ref 30.0–36.0)
MCV: 83.9 fL (ref 80.0–100.0)
Platelets: 247 10*3/uL (ref 150–400)
RBC: 3.72 MIL/uL — ABNORMAL LOW (ref 3.87–5.11)
RDW: 16.4 % — ABNORMAL HIGH (ref 11.5–15.5)
WBC: 6.5 10*3/uL (ref 4.0–10.5)
nRBC: 0 % (ref 0.0–0.2)

## 2022-02-23 LAB — BASIC METABOLIC PANEL
Anion gap: 10 (ref 5–15)
BUN: 53 mg/dL — ABNORMAL HIGH (ref 8–23)
CO2: 32 mmol/L (ref 22–32)
Calcium: 9.5 mg/dL (ref 8.9–10.3)
Chloride: 98 mmol/L (ref 98–111)
Creatinine, Ser: 3.57 mg/dL — ABNORMAL HIGH (ref 0.44–1.00)
GFR, Estimated: 14 mL/min — ABNORMAL LOW (ref >60)
Glucose, Bld: 127 mg/dL — ABNORMAL HIGH (ref 70–99)
Potassium: 4 mmol/L (ref 3.5–5.1)
Sodium: 140 mmol/L (ref 135–145)

## 2022-02-23 LAB — GLUCOSE, CAPILLARY
Glucose-Capillary: 135 mg/dL — ABNORMAL HIGH (ref 70–99)
Glucose-Capillary: 166 mg/dL — ABNORMAL HIGH (ref 70–99)
Glucose-Capillary: 134 mg/dL — ABNORMAL HIGH (ref 70–99)
Glucose-Capillary: 209 mg/dL — ABNORMAL HIGH (ref 70–99)

## 2022-02-23 LAB — MAGNESIUM: Magnesium: 2.3 mg/dL (ref 1.7–2.4)

## 2022-02-23 MED ORDER — TORSEMIDE 20 MG PO TABS
60.0000 mg | ORAL_TABLET | Freq: Two times a day (BID) | ORAL | Status: DC
  Administered 2022-02-23 – 2022-02-24 (×2): 60 mg via ORAL
  Filled 2022-02-23 (×2): qty 3

## 2022-02-23 NOTE — Progress Notes (Signed)
Orthopedic Tech Progress Note Patient Details:  [name] 01-15-1956 [id number]  Bilateral unna boots applied.  Ortho Devices Type of Ortho Device: Haematologist Ortho Device/Splint Location: BLE Ortho Device/Splint Interventions: Ordered, Application, Adjustment   Post Interventions Patient Tolerated: Well Instructions Provided: Care of device  [name] Jeri Modena 02/23/2022, 4:40 PM

## 2022-02-23 NOTE — Plan of Care (Signed)
  Problem: Health Behavior/Discharge Planning: Goal: Ability to manage health-related needs will improve Outcome: Progressing   Problem: Clinical Measurements: Goal: Ability to maintain clinical measurements within normal limits will improve Outcome: Progressing Goal: Diagnostic test results will improve Outcome: Progressing   

## 2022-02-23 NOTE — Progress Notes (Signed)
Patient ID: [name], female   DOB: 01-13-1956, 66 y.o.   MRN: [id number] [facility] Progress Note   Assessment/ Plan:   1. Acute kidney Injury on chronic kidney disease stage IV: Baseline creatinine likely 3.0-3.5 (with lower creatinine likely an effect of dilution/hypovolemia).  Decent urine output again overnight with net negative fluid balance and corresponding weight loss noted.  Based on her symptoms and slight rise of creatinine overnight, I suspect that she has attained her euvolemic state and we will switch her to her outpatient dose of oral torsemide 60 mg twice daily (first dose this evening).  If labs look stable tomorrow morning, may plausibly be able to discharge home on oral diuretics. 2.  Acute exacerbation of systolic heart failure: With recurrent hospitalizations for CHF exacerbation and improving volume status on intravenous furosemide with p.o. metolazone (takes torsemide/metolazone as an outpatient).  Right heart catheterization done yesterday showed normal PCWP, mild pulmonary venous hypertension and mildly elevated RA pressure indicating volume status closer to euvolemia. 3.  Atrial fibrillation: Appears to be successfully rate controlled on metoprolol. 4.  Anemia: Likely secondary to chronic kidney disease as well as iron deficiency for which she is on intravenous iron supplementation.  Subjective:   Continues to feel better with regards to her shortness of breath/"tightness in her belly and limbs".   Objective:   BP 114/63 (BP Location: Right Arm)   Pulse 86   Temp 98.4 F (36.9 C) (Oral)   Resp 16   Ht '5\' 7"'$  (1.702 m)   Wt 121.6 kg   SpO2 98%   BMI 41.99 kg/m   Intake/Output Summary (Last 24 hours) at 02/23/2022 0737 Last data filed at 02/23/2022 0500 Gross per 24 hour  Intake 546 ml  Output 3400 ml  Net -2854 ml   Weight change: -2.041 kg  Physical Exam: Gen: Comfortably laying flat in bed CVS: Pulse regular rhythm, normal rate, S1 and S2  normal Resp: Diminished breath sounds over bases, no distinct rales or rhonchi Abd: Soft, obese, nontender, bowel sounds normal Ext: Trace-1+ upper extremity edema with 1+ bilateral lower extremity edema  Imaging: CARDIAC CATHETERIZATION  Result Date: 02/22/2022 1. Normal PCWP 2. Mild pulmonary venous hypertension. 3. Mildly elevated RA pressure. 4. Preserved cardiac output.    Labs: BMET Recent Labs  Lab 02/17/22 0252 02/18/22 0240 02/19/22 0429 02/20/22 0727 02/21/22 0144 02/22/22 0200 02/23/22 0353  NA 138 142 140 140 137 140 140  K 4.0 3.8 3.5 4.1 3.9 3.7 4.0  CL 100 99 98 100 96* 94* 98  CO2 '30 30 31 '$ 34* 31 33* 32  GLUCOSE 129* 119* 99 90 121* 121* 127*  BUN 37* 42* 42* 44* 51* 56* 53*  CREATININE 3.01* 3.33* 3.10* 3.29* 3.22* 3.19* 3.57*  CALCIUM 9.3 9.4 9.3 9.3 9.4 9.6 9.5   CBC Recent Labs  Lab 02/17/22 0252 02/20/22 0727 02/23/22 0353  WBC 5.0 5.1 6.5  HGB 8.4* 8.9* 9.5*  HCT 28.1* 29.0* 31.2*  MCV 83.1 83.8 83.9  PLT 224 217 247    Medications:     amiodarone  200 mg Oral Daily   apixaban  5 mg Oral BID   busPIRone  5 mg Oral BID   colchicine  0.3 mg Oral Daily   cycloSPORINE  1 drop Both Eyes BID   docusate sodium  100 mg Oral BID   DULoxetine  60 mg Oral Daily   fluticasone  2 spray Each Nare Daily   insulin aspart  0-9 Units Subcutaneous TID WC   loratadine  10 mg Oral Daily   metoprolol succinate  25 mg Oral Daily   multivitamin with minerals   Oral Daily   pantoprazole  40 mg Oral Daily   polyethylene glycol  17 g Oral Daily   potassium chloride SA  60 mEq Oral BID   rosuvastatin  10 mg Oral QPM   senna-docusate  1 tablet Oral BID   sodium chloride flush  3 mL Intravenous Q12H   sodium chloride flush  3 mL Intravenous Q12H   sodium chloride flush  3 mL Intravenous Q12H    Elmarie Shiley, MD 02/23/2022, 7:37 AM

## 2022-02-23 NOTE — Progress Notes (Signed)
PROGRESS NOTE  [name]  AST:[id number] DOB: Apr 12, 1956 DOA: 02/12/2022 PCP: [name], NP   Brief Narrative:  Patient is a 66 year old female with history of systolic congestive heart failure, severe right ventricular failure, status post mitral valve repair, severe tricuspid regurgitation, chronic A-fib, CKD stage IV who presented here with orthopnea, PND, weight gain and found to have fluid overload.Started on IV diuresis,now transitioned to oral.  Cardiology, nephrology following.  Plan for discharge tomorrow to home.  Assessment & Plan:  Principal Problem:   Acute on chronic right heart failure (HCC) Active Problems:   AF (paroxysmal atrial fibrillation) (HCC)   Essential hypertension   Diabetes mellitus type 2 in obese (HCC)   Type 2 diabetes mellitus with hyperlipidemia (HCC)   Depression   Class 3 obesity (HCC)   NICM (nonischemic cardiomyopathy) ([facility])   S/P mitral valve repair   Prolonged QT interval   Anxiety and depression   GERD (gastroesophageal reflux disease)  Acute on chronic biventricular systolic heart failure : Echocardiogram on 6/23 showed normal ejection fraction.  Right heart cath on 6/27 showed elevated biventricular pressure.  Presented with fluid overload, orthopnea, PND.  She was  on Lasix 160 mg twice a day along with metolazone.  Now transitioned to torsemide.  Being followed by advanced heart failure team.    Monitor BMP.   Remains on room air. Repeat right heart cath on 7/5 showed mild pulmonary venous hypertension, mildly elevated RA pressure, normal PCWP, preserved cardiac output  Paroxysmal A-fib: Failed Tikosyn.  Status post maze procedure on 5/22.  Underwent multiple cardioversions.  Currently in normal sinus rhythm.  Continue amiodarone, on Eliquis for anticoagulation.  Also on metoprolol  CKD stage IV: Currently kidney function around baseline.  Monitor volume status.  Monitor BMP.  Nephrology following.  On torsemide 60 mg twice  daily  Normocytic anemia: Most likely in the setting of chronic kidney disease.  Iron studies showed low iron.  Given IV iron  Coronary disease: No anginal symptoms at present.  Continue statin, beta-blocker.  Diabetes type 2: On Trulicity at home.  Continue current insulin regimen.  Monitor blood sugars  Depression/anxiety: On Cymbalta, BuSpar  History of sleep apnea: Not on CPAP.  Seen in the process of getting a new sleep study  Asymptomatic bacteriuria: Urine culture from 6/26 showed E. coli. Urinary symptoms.  Deferred treatment   Morbid obesity: BMI  of 45  Deconditioning: Patient was seen by PT earlier and recommended outpatient follow up.  We will request for PT reevaluation            DVT prophylaxis: apixaban (ELIQUIS) tablet 5 mg     Code Status: Full Code  Family Communication: None at bedside  Patient status:Inpatient  Patient is from :Home  Anticipated discharge LG:[id number]  Estimated DC date:tomorrow   Consultants: Cardiology, nephrology  Procedures: None  Antimicrobials:  Anti-infectives (From admission, onward)    None       Subjective:  Patient seen and examined at the bedside this morning.  Hemodynamically stable without any complaints.  On room air.  Lying in bed as always.  Denies new complaints.  Complains of weakness.  Objective: Vitals:   02/23/22 0015 02/23/22 0455 02/23/22 0500 02/23/22 1031  BP: 106/63 114/63  99/61  Pulse: 86 86  86  Resp: '20 16  20  '$ Temp: 98.6 F (37 C) 98.4 F (36.9 C)  98.2 F (36.8 C)  TempSrc: Oral Oral  Oral  SpO2: 95% 98%  96%  Weight:   121.6 kg   Height:        Intake/Output Summary (Last 24 hours) at 02/23/2022 1120 Last data filed at 02/23/2022 0742 Gross per 24 hour  Intake 786 ml  Output 2800 ml  Net -2014 ml   Filed Weights   02/21/22 0354 02/22/22 0425 02/23/22 0500  Weight: 126.1 kg 123.7 kg 121.6 kg    Examination:  General exam: Overall comfortable, not in  distress,obese HEENT: PERRL Respiratory system:  no wheezes or crackles  Cardiovascular system: S1 & S2 heard, RRR.  Gastrointestinal system: Abdomen is nondistended, soft and nontender. Central nervous system: Alert and oriented Extremities: Bilateral lower extremity pitting edema, no clubbing ,no cyanosis Skin: No rashes, no ulcers,no icterus     Data Reviewed: I have personally reviewed following labs and imaging studies  CBC: Recent Labs  Lab 02/17/22 0252 02/20/22 0727 02/23/22 0353  WBC 5.0 5.1 6.5  HGB 8.4* 8.9* 9.5*  HCT 28.1* 29.0* 31.2*  MCV 83.1 83.8 83.9  PLT 224 217 237   Basic Metabolic Panel: Recent Labs  Lab 02/17/22 0252 02/18/22 0240 02/19/22 0429 02/20/22 0727 02/21/22 0144 02/22/22 0200 02/23/22 0353  NA 138   < > 140 140 137 140 140  K 4.0   < > 3.5 4.1 3.9 3.7 4.0  CL 100   < > 98 100 96* 94* 98  CO2 30   < > 31 34* 31 33* 32  GLUCOSE 129*   < > 99 90 121* 121* 127*  BUN 37*   < > 42* 44* 51* 56* 53*  CREATININE 3.01*   < > 3.10* 3.29* 3.22* 3.19* 3.57*  CALCIUM 9.3   < > 9.3 9.3 9.4 9.6 9.5  MG 2.1  --   --   --  2.3  --  2.3   < > = values in this interval not displayed.     No results found for this or any previous visit (from the past 240 hour(s)).    Radiology Studies: CARDIAC CATHETERIZATION  Result Date: 02/22/2022 1. Normal PCWP 2. Mild pulmonary venous hypertension. 3. Mildly elevated RA pressure. 4. Preserved cardiac output.    Scheduled Meds:  amiodarone  200 mg Oral Daily   apixaban  5 mg Oral BID   busPIRone  5 mg Oral BID   colchicine  0.3 mg Oral Daily   cycloSPORINE  1 drop Both Eyes BID   docusate sodium  100 mg Oral BID   DULoxetine  60 mg Oral Daily   fluticasone  2 spray Each Nare Daily   insulin aspart  0-9 Units Subcutaneous TID WC   loratadine  10 mg Oral Daily   metoprolol succinate  25 mg Oral Daily   multivitamin with minerals   Oral Daily   pantoprazole  40 mg Oral Daily   polyethylene glycol  17 g  Oral Daily   potassium chloride SA  60 mEq Oral BID   rosuvastatin  10 mg Oral QPM   senna-docusate  1 tablet Oral BID   sodium chloride flush  3 mL Intravenous Q12H   sodium chloride flush  3 mL Intravenous Q12H   sodium chloride flush  3 mL Intravenous Q12H   torsemide  60 mg Oral BID   Continuous Infusions:  sodium chloride     sodium chloride       LOS: 11 days   Shelly Coss, MD [facility] P7/01/2022, 11:20 AM

## 2022-02-23 NOTE — Progress Notes (Signed)
Physical Therapy Treatment Patient Details Name: [name] MRN: [id number] DOB: 01-16-1956 Today's Date: 02/23/2022   History of Present Illness Pt is a 66 yo femaled presenting with SOB and increased LE edema. Admitted 02/12/2022 for CHF exacerbation. Recent hospitalization from 5/30-6/5 for CHF exacerbation. Previous trip to ED for similar symptoms on 6/20, with fall returning home. 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    PT Comments    Pt tolerates therapy well today, ambulating limited community distances with intermittent rest breaks on an AD. Pt reports that her ambulation feels best with a rollator currently, and PT concurs. PT recommends a rollator for the Pt so that she can ambulate increased distances with a consistent, safe place to rest. Continued therapy will facilitate the patient in building strength and activity tolerance to progress her to her baseline level of independence.  Recommendations for follow up therapy are one component of a multi-disciplinary discharge planning process, led by the attending physician.  Recommendations may be updated based on patient status, additional functional criteria and insurance authorization.  Follow Up Recommendations  Outpatient PT     Assistance Recommended at Discharge PRN  Patient can return home with the following A little help with bathing/dressing/bathroom;Assistance with cooking/housework;Help with stairs or ramp for entrance   Equipment Recommendations  Rollator (4 wheels)    Recommendations for Other Services       Precautions / Restrictions Precautions Precautions: Fall Precaution Comments: increased LE swelling Restrictions Weight Bearing Restrictions: No     Mobility  Bed Mobility Overal bed mobility: Modified Independent Bed Mobility: Sit to Supine     Supine to sit: Modified  independent (Device/Increase time)          Transfers Overall transfer level: Independent Equipment used: None Transfers: Sit to/from Stand Sit to Stand: Independent (Does use hands to push off bed)                Ambulation/Gait Ambulation/Gait assistance: Modified independent (Device/Increase time) Gait Distance (Feet): 100 Feet (3 additional trials of ~100'; seated rest breaks on rollator between) Assistive device: Rollator (4 wheels) Gait Pattern/deviations: Step-through pattern, Decreased step length - right, Decreased step length - left, Decreased stride length Gait velocity: decreased Gait velocity interpretation: <1.8 ft/sec, indicate of risk for recurrent falls   General Gait Details: Pt manages rollator well, given verbal cues to remind her to use brakes during transfers; otherwise Pt able to self monitor symptoms and when she needs to rest   Stairs             Wheelchair Mobility    Modified Rankin (Stroke Patients Only)       Balance Overall balance assessment: Needs assistance Sitting-balance support: Feet supported, No upper extremity supported Sitting balance-Leahy Scale: Good     Standing balance support: Bilateral upper extremity supported, During functional activity, Reliant on assistive device for balance Standing balance-Leahy Scale: Poor                              Cognition Arousal/Alertness: Awake/alert Behavior During Therapy: WFL for tasks assessed/performed Overall Cognitive Status: Within Functional Limits for tasks assessed  Exercises      General Comments General comments (skin integrity, edema, etc.): VSS on RA; pt wearing compression wraps on B LE during session      Pertinent Vitals/Pain Pain Assessment Pain Assessment: 0-10 Pain Score: 7  Pain Location: B LE Pain Descriptors / Indicators: Numbness Pain Intervention(s): Monitored during  session    Home Living                          Prior Function            PT Goals (current goals can now be found in the care plan section) Acute Rehab PT Goals Patient Stated Goal: to go home PT Goal Formulation: With patient Time For Goal Achievement: 02/27/22 Potential to Achieve Goals: Good Progress towards PT goals: Progressing toward goals    Frequency    Min 2X/week      PT Plan Current plan remains appropriate    Co-evaluation              AM-PAC PT "6 Clicks" Mobility   Outcome Measure  Help needed turning from your back to your side while in a flat bed without using bedrails?: None Help needed moving from lying on your back to sitting on the side of a flat bed without using bedrails?: None Help needed moving to and from a bed to a chair (including a wheelchair)?: None Help needed standing up from a chair using your arms (e.g., wheelchair or bedside chair)?: None Help needed to walk in hospital room?: None Help needed climbing 3-5 steps with a railing? : A Little 6 Click Score: 23    End of Session   Activity Tolerance: Patient tolerated treatment well Patient left: in chair;with call bell/phone within reach Nurse Communication: Mobility status PT Visit Diagnosis: Other abnormalities of gait and mobility (R26.89);Muscle weakness (generalized) (M62.81)     Time: 6948-5462 PT Time Calculation (min) (ACUTE ONLY): 32 min  Charges:  $Gait Training: 23-37 mins                     Hall Busing, SPT Acute Rehabilitation Office #: [phone]    Hall Busing 02/23/2022, 3:25 PM

## 2022-02-23 NOTE — Progress Notes (Addendum)
Patient ID: [name], female   DOB: 1956/05/05, 66 y.o.   MRN: [id number]     Advanced Heart Failure Rounding Note  PCP-Cardiologist: Buford Dresser, MD   Subjective:    RHC 07/05 Normal PCWP, mild pulmonary venous hypertension, mildly elevated RA pressure, preserved CO. Findings:   RHC done today: Procedural Findings: Hemodynamics (mmHg) RA mean 11 RV 48/10 PA 48/15, mean 28 PCWP mean 14 Oxygen saturations: PA 59% AO 95% Cardiac Output (Fick) 7.05  Cardiac Index (Fick) 3.05 PVR 1.99 WU PAPi 3  Good diuresis again yesterday with 160 mg IV lasix BID. Weight down another 4 lb.   Scr up slightly, 3.2>3.57  Nephrology has transitioned to IV lasix to PO torsemide 60 BID (prior home dose).  Feeling well today. Denies dyspnea and abdominal bloating. Believes she is back to baseline.    Objective:   Weight Range: 121.6 kg Body mass index is 41.99 kg/m.   Vital Signs:   Temp:  [97.7 F (36.5 C)-98.6 F (37 C)] 98.4 F (36.9 C) (07/06 0455) Pulse Rate:  [82-158] 86 (07/06 0455) Resp:  [16-29] 16 (07/06 0455) BP: (106-129)/(49-91) 114/63 (07/06 0455) SpO2:  [94 %-100 %] 98 % (07/06 0455) Weight:  [121.6 kg] 121.6 kg (07/06 0500) Last BM Date : 02/21/22  Weight change: Filed Weights   02/21/22 0354 02/22/22 0425 02/23/22 0500  Weight: 126.1 kg 123.7 kg 121.6 kg    Intake/Output:   Intake/Output Summary (Last 24 hours) at 02/23/2022 0837 Last data filed at 02/23/2022 0742 Gross per 24 hour  Intake 786 ml  Output 3400 ml  Net -2614 ml      Physical Exam    General:  Well appearing. No resp difficulty HEENT: normal Neck: supple. JVP ~10. Carotids 2+ bilat; no bruits.  Cor: PMI nondisplaced. Regular rate & rhythm. No rubs, gallops, 3/6 TR murmur Lungs: clear Abdomen: soft, nontender, nondistended.  Extremities: no cyanosis, clubbing, rash, trace edema, + UNNA boots Neuro: alert & orientedx3, cranial nerves grossly intact. moves all 4 extremities  w/o difficulty. Affect pleasant    Telemetry   SR 80s, 5-10 PVCs/min  EKG    N/A   Labs    CBC Recent Labs    02/23/22 0353  WBC 6.5  HGB 9.5*  HCT 31.2*  MCV 83.9  PLT 921    Basic Metabolic Panel Recent Labs    02/21/22 0144 02/22/22 0200 02/23/22 0353  NA 137 140 140  K 3.9 3.7 4.0  CL 96* 94* 98  CO2 31 33* 32  GLUCOSE 121* 121* 127*  BUN 51* 56* 53*  CREATININE 3.22* 3.19* 3.57*  CALCIUM 9.4 9.6 9.5  MG 2.3  --  2.3   Liver Function Tests No results for input(s): "AST", "ALT", "ALKPHOS", "BILITOT", "PROT", "ALBUMIN" in the last 72 hours. No results for input(s): "LIPASE", "AMYLASE" in the last 72 hours. Cardiac Enzymes No results for input(s): "CKTOTAL", "CKMB", "CKMBINDEX", "TROPONINI" in the last 72 hours.  BNP: BNP (last 3 results) Recent Labs    01/26/22 1156 02/06/22 1809 02/12/22 1226  BNP 375.6* 550.8* 517.3*    ProBNP (last 3 results) No results for input(s): "PROBNP" in the last 8760 hours.   D-Dimer No results for input(s): "DDIMER" in the last 72 hours. Hemoglobin A1C No results for input(s): "HGBA1C" in the last 72 hours. Fasting Lipid Panel No results for input(s): "CHOL", "HDL", "LDLCALC", "TRIG", "CHOLHDL", "LDLDIRECT" in the last 72 hours. Thyroid Function Tests No results for input(s): "TSH", "T4TOTAL", "  T3FREE", "THYROIDAB" in the last 72 hours.  Invalid input(s): "FREET3"  Other results:   Imaging    CARDIAC CATHETERIZATION  Result Date: 02/22/2022 1. Normal PCWP 2. Mild pulmonary venous hypertension. 3. Mildly elevated RA pressure. 4. Preserved cardiac output.     Medications:     Scheduled Medications:  amiodarone  200 mg Oral Daily   apixaban  5 mg Oral BID   busPIRone  5 mg Oral BID   colchicine  0.3 mg Oral Daily   cycloSPORINE  1 drop Both Eyes BID   docusate sodium  100 mg Oral BID   DULoxetine  60 mg Oral Daily   fluticasone  2 spray Each Nare Daily   insulin aspart  0-9 Units Subcutaneous  TID WC   loratadine  10 mg Oral Daily   metoprolol succinate  25 mg Oral Daily   multivitamin with minerals   Oral Daily   pantoprazole  40 mg Oral Daily   polyethylene glycol  17 g Oral Daily   potassium chloride SA  60 mEq Oral BID   rosuvastatin  10 mg Oral QPM   senna-docusate  1 tablet Oral BID   sodium chloride flush  3 mL Intravenous Q12H   sodium chloride flush  3 mL Intravenous Q12H   sodium chloride flush  3 mL Intravenous Q12H   torsemide  60 mg Oral BID    Infusions:  sodium chloride     sodium chloride      PRN Medications: sodium chloride, sodium chloride, acetaminophen, albuterol, nitroGLYCERIN, ondansetron (ZOFRAN) IV, oxyCODONE, sodium chloride flush, sodium chloride flush    Patient Profile   66 y.o. female with history of chronic biventricular systolic CHF, PAF/AFL, CKD IV, CAD, valvular heart disease, DMII, OSA. Multiple admissions for decompensated HF mostly in setting of recurrent AF.    Now presenting with a/c CHF.   Assessment/Plan     1. Acute on chronic Biventricular Systolic Heart Failure: - RHC (6/22) with low filling pressures with moderately reduced CO. - Echo (7/22) in setting of AFL with RVR, showed EF 30-35%, moderate LVH, moderate RV dysfunction, PASP 48, s/p MV repair with mild MR and mean gradient 10 mmHg with HR around 130, s/p TV repair with mild-moderate TR, dilated IVC.   - RHC 4/23 markedly elevated biventricular pressures - Echo 4/23 EF 35-40% RV moderately down severe TR. Moderate MS mean grad 78m HG - TEE 01/20/22: Normal LV EF 55%, severe RV dysfunction, trivial MR, mean gradient 3 across MV prosthesis, severe TR - Multiple admissions recently for HF mostly occur in setting of recurrent AF. Currently in NSR. - RHC 06/27 markedly elevated biventricular pressures, low PAPi, PA sats low but preserved Fick CO. - Repeat RHC 07/05 after diuresis - mild pulmonary venous hypertension, mildly elevated RA pressure, normal PCWP, preserved  CO - NYHA IIIb/IV. Has diuresed well. Cr up slightly overnight, 3.2>3.57. Suspect volume likely as good as we will get. Nephrology has converted to po diuretic, Torsemide 60 mg BID (prior home dose. Previously taking 2.5 mg metolazone twice a week, wonder if she may need intermittent metolazone after discharge. - In and out of AFL at recent visits. Maintaining SR this admit. - UNNA boots - GDMT limited by CKD.  - No Bidil for now.  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 Worry that she may end up with dialysis in the future.    2. PAF/AFL -  She has failed Tikosyn. - She had Maze in 5/22 and DCCV in 6/22 & 04/19/21, last of which she had ERAF. - Multiple admissions recently for HF mostly occur in setting of recurrent AF/AFL. Last DCCV 06/13.  - Currently maintaining SR - Continue amiodarone + Eliquis  - Unable to tolerate AF at all. Follows with EP. Not a candidate for Afib ablation or AV nodal ablation/PPM - Has OSA, awaiting split night study for CPAP titration.  - Keep K > 4.0 and Mg > 2.0.    3. CKD Stage IV   - Nephrology consulted.  - Baseline Scr likely 3-3.5, recent Scr 2-2.5 likely associated with dilution in setting of volume overload. - Scr 3.0 => 3.3 => 3.1 => 3.29 => 3.22 => 3.19 => 3.57 - Converting to oral diuretic as above - I worry that she will eventually need HD for volume management.  - No SGLT2i, GFR has been consistently less than 20   4. CAD - Cath (2019): Mild nonobstructive CAD - No chest pain.  - Continue statin + beta blocker - no ASA w/ Eliquis    5. Valvular Heart Disease: - S/p MV and TV repair in 5/22.  - Echo 02/25/21 with mild-moderate TR.   - Echo 4/23 35-40% RV moderately down. severe TR. Moderate MS mean gradient 28mHG  - TEE 06/23: LV EF 55% with severe RV dysfunction, trivial MR, mean gradient 3 across MV prosthesis, severe TR - Seen by structural heart team. Has been turned down for TV  valve in ring due to markedly elevated wedge pressures despite diuresis.   6. Obesity - Body mass index is 48 - Discussed portion control.    7. DMII - Hgb A1c 6.7% 04/23 - GFR has been too low for SGLT2i   8. OSA - initial sleep study positive but was awaiting split night study to titrate CPAP - Has been referred to Dr. TRadford Pax- suspect this is part of her AF recurrence   9. PVCs - ~ 10 PVCs/min on tele - Continue PO amiodarone  - Keep K > 4 and Mag > 2  10. Bacteriuria - Urine culture grew E.Coli and Enterobacter cloacae - Not currently treated d/t lack of symptoms  11. Anemia - Hgb 8s-9s - Iron stores low - Feraheme given this admit.   Outpatient PT recommended at discharge.   Hopefully will be ready for discharge tomorrow if volume status stable on PO Torsemide.   Will arrange for f/u in HF clinic.   Length of Stay: [address] LYoe PA-C  02/23/2022, 8:37 AM  Advanced Heart Failure Team Pager [phone](M-F; 7a - 5p)  Please contact [facility] for night-coverage after hours (5p -7a ) and weekends on [email or web address]   Patient seen with PA, agree with the above note.   Weight down again this morning.  Creatinine starting to rise, up to 3.57.   She feels good overall.   General: NAD Neck: JVP 8-9 cm, no thyromegaly or thyroid nodule.  Lungs: Clear to auscultation bilaterally with normal respiratory effort. CV: Nondisplaced PMI.  Heart regular S1/S2, no S3/S4, no murmur.  No peripheral edema.   Abdomen: Soft, nontender, no hepatosplenomegaly, no distention.  Skin: Intact without lesions or rashes.  Neurologic: Alert and oriented x 3.  Psych: Normal affect. Extremities: No clubbing or cyanosis.  HEENT: Normal.   RHC yesterday with only mildly elevated right-sided pressures.  Creatinine starting to rise.  - No more IV Lasix.  - Will  start torsemide 60 mg bid this evening.  - If creatinine stays relatively stable, can probably go home tomorrow.   Loralie Champagne 02/23/2022 11:09 AM

## 2022-02-24 ENCOUNTER — Other Ambulatory Visit (HOSPITAL_COMMUNITY): Payer: Self-pay

## 2022-02-24 ENCOUNTER — Encounter (INDEPENDENT_AMBULATORY_CARE_PROVIDER_SITE_OTHER): Payer: 59

## 2022-02-24 ENCOUNTER — Ambulatory Visit (HOSPITAL_BASED_OUTPATIENT_CLINIC_OR_DEPARTMENT_OTHER): Payer: 59 | Admitting: Cardiology

## 2022-02-24 DIAGNOSIS — I50813 Acute on chronic right heart failure: Secondary | ICD-10-CM | POA: Diagnosis not present

## 2022-02-24 LAB — RENAL FUNCTION PANEL
Albumin: 3.2 g/dL — ABNORMAL LOW (ref 3.5–5.0)
Anion gap: 9 (ref 5–15)
BUN: 50 mg/dL — ABNORMAL HIGH (ref 8–23)
CO2: 30 mmol/L (ref 22–32)
Calcium: 9.2 mg/dL (ref 8.9–10.3)
Chloride: 100 mmol/L (ref 98–111)
Creatinine, Ser: 3.33 mg/dL — ABNORMAL HIGH (ref 0.44–1.00)
GFR, Estimated: 15 mL/min — ABNORMAL LOW (ref >60)
Glucose, Bld: 103 mg/dL — ABNORMAL HIGH (ref 70–99)
Phosphorus: 4.1 mg/dL (ref 2.5–4.6)
Potassium: 3.7 mmol/L (ref 3.5–5.1)
Sodium: 139 mmol/L (ref 135–145)

## 2022-02-24 LAB — GLUCOSE, CAPILLARY
Glucose-Capillary: 107 mg/dL — ABNORMAL HIGH (ref 70–99)
Glucose-Capillary: 187 mg/dL — ABNORMAL HIGH (ref 70–99)

## 2022-02-24 MED ORDER — POTASSIUM CHLORIDE CRYS ER 20 MEQ PO TBCR: 40.0000 meq | EXTENDED_RELEASE_TABLET | Freq: Once | ORAL | Status: DC

## 2022-02-24 MED ORDER — ALBUTEROL SULFATE HFA 108 (90 BASE) MCG/ACT IN AERS
INHALATION_SPRAY | RESPIRATORY_TRACT | 1 refills | Status: DC
  Filled 2022-02-24: qty 18, 25d supply, fill #0

## 2022-02-24 MED ORDER — SENNOSIDES-DOCUSATE SODIUM 8.6-50 MG PO TABS
1.0000 | ORAL_TABLET | Freq: Two times a day (BID) | ORAL | 0 refills | Status: DC
  Filled 2022-02-24: qty 14, 7d supply, fill #0

## 2022-02-24 MED ORDER — APIXABAN 5 MG PO TABS
ORAL_TABLET | ORAL | 1 refills | Status: DC
  Filled 2022-02-24: qty 60, 30d supply, fill #0

## 2022-02-24 MED ORDER — POLYETHYLENE GLYCOL 3350 17 GM/SCOOP PO POWD
17.0000 g | Freq: Every day | ORAL | 0 refills | Status: DC
  Filled 2022-02-24: qty 238, 14d supply, fill #0

## 2022-02-24 MED ORDER — METOLAZONE 2.5 MG PO TABS: 2.5000 mg | ORAL_TABLET | ORAL | 11 refills | Status: DC

## 2022-02-24 NOTE — TOC CM/SW Note (Addendum)
HF TOC CM spoke to pt and offered choice for [facility]. (Medicare.gov list with ratings given and placed on pt's chart). Pt agreeable to agency that will accept insurance. Contacted [facility] rep, Levada Dy with new referral. [facility] accepted referral for [facility]. [place], Heart Failure TOC CM [phone]

## 2022-02-24 NOTE — Progress Notes (Signed)
Pt continues to refuse CPAP °

## 2022-02-24 NOTE — Progress Notes (Addendum)
Patient ID: [name], female   DOB: 21-Nov-1955, 66 y.o.   MRN: [id number]     Advanced Heart Failure Rounding Note  PCP-Cardiologist: Buford Dresser, MD   Subjective:    RHC 07/05 Normal PCWP, mild pulmonary venous hypertension, mildly elevated RA pressure, preserved CO. Findings:   RHC done today: Procedural Findings: Hemodynamics (mmHg) RA mean 11 RV 48/10 PA 48/15, mean 28 PCWP mean 14 Oxygen saturations: PA 59% AO 95% Cardiac Output (Fick) 7.05  Cardiac Index (Fick) 3.05 PVR 1.99 WU PAPi 3  IV lasix + metolazone stopped 07/06. Started PO torsemide 60 BID (home dose).   Scr 3.2>3.57>3.33  2.5L UOP yesterday. Weight stable.   Feeling well today. No dyspnea, orthopnea or PND. Ready to go home.   Objective:   Weight Range: 121.5 kg Body mass index is 41.96 kg/m.   Vital Signs:   Temp:  [98.2 F (36.8 C)] 98.2 F (36.8 C) (07/07 0402) Pulse Rate:  [79-86] 82 (07/07 0402) Resp:  [14-20] 18 (07/07 0402) BP: (99-119)/(61-69) 114/69 (07/07 0402) SpO2:  [96 %-97 %] 97 % (07/07 0402) Weight:  [121.5 kg] 121.5 kg (07/07 0551) Last BM Date : 02/23/22  Weight change: Filed Weights   02/22/22 0425 02/23/22 0500 02/24/22 0551  Weight: 123.7 kg 121.6 kg 121.5 kg    Intake/Output:   Intake/Output Summary (Last 24 hours) at 02/24/2022 0758 Last data filed at 02/24/2022 0559 Gross per 24 hour  Intake 600 ml  Output 2550 ml  Net -1950 ml      Physical Exam    General:  Well appearing. Sitting up in bed. HEENT: normal Neck: supple. JVP 8-10 cm. Carotids 2+ bilat; no bruits.  Cor: PMI nondisplaced. Regular rate & rhythm. No rubs, gallops, 3/6 TR murmur Lungs: diminished Abdomen: obese, soft, nontender, nondistended.  Extremities: no cyanosis, clubbing, rash, trace edema, + UNNA Neuro: alert & orientedx3, cranial nerves grossly intact. moves all 4 extremities w/o difficulty. Affect pleasant     Telemetry   SR 80s, ~ 15 PVCs/min  EKG    N/A    Labs    CBC Recent Labs    02/22/22 1448 02/23/22 0353  WBC  --  6.5  HGB 10.5* 9.5*  HCT 31.0* 31.2*  MCV  --  83.9  PLT  --  947    Basic Metabolic Panel Recent Labs    02/23/22 0353 02/24/22 0156  NA 140 139  K 4.0 3.7  CL 98 100  CO2 32 30  GLUCOSE 127* 103*  BUN 53* 50*  CREATININE 3.57* 3.33*  CALCIUM 9.5 9.2  MG 2.3  --   PHOS  --  4.1   Liver Function Tests Recent Labs    02/24/22 0156  ALBUMIN 3.2*   No results for input(s): "LIPASE", "AMYLASE" in the last 72 hours. Cardiac Enzymes No results for input(s): "CKTOTAL", "CKMB", "CKMBINDEX", "TROPONINI" in the last 72 hours.  BNP: BNP (last 3 results) Recent Labs    01/26/22 1156 02/06/22 1809 02/12/22 1226  BNP 375.6* 550.8* 517.3*    ProBNP (last 3 results) No results for input(s): "PROBNP" in the last 8760 hours.   D-Dimer No results for input(s): "DDIMER" in the last 72 hours. Hemoglobin A1C No results for input(s): "HGBA1C" in the last 72 hours. Fasting Lipid Panel No results for input(s): "CHOL", "HDL", "LDLCALC", "TRIG", "CHOLHDL", "LDLDIRECT" in the last 72 hours. Thyroid Function Tests No results for input(s): "TSH", "T4TOTAL", "T3FREE", "THYROIDAB" in the last 72 hours.  Invalid  input(s): "FREET3"  Other results:   Imaging    No results found.   Medications:     Scheduled Medications:  amiodarone  200 mg Oral Daily   apixaban  5 mg Oral BID   busPIRone  5 mg Oral BID   colchicine  0.3 mg Oral Daily   cycloSPORINE  1 drop Both Eyes BID   docusate sodium  100 mg Oral BID   DULoxetine  60 mg Oral Daily   fluticasone  2 spray Each Nare Daily   insulin aspart  0-9 Units Subcutaneous TID WC   loratadine  10 mg Oral Daily   metoprolol succinate  25 mg Oral Daily   multivitamin with minerals   Oral Daily   pantoprazole  40 mg Oral Daily   polyethylene glycol  17 g Oral Daily   potassium chloride SA  60 mEq Oral BID   rosuvastatin  10 mg Oral QPM   senna-docusate   1 tablet Oral BID   sodium chloride flush  3 mL Intravenous Q12H   sodium chloride flush  3 mL Intravenous Q12H   sodium chloride flush  3 mL Intravenous Q12H   torsemide  60 mg Oral BID    Infusions:  sodium chloride     sodium chloride      PRN Medications: sodium chloride, sodium chloride, acetaminophen, albuterol, nitroGLYCERIN, ondansetron (ZOFRAN) IV, oxyCODONE, sodium chloride flush, sodium chloride flush    Patient Profile   66 y.o. female with history of chronic biventricular systolic CHF, PAF/AFL, CKD IV, CAD, valvular heart disease, DMII, OSA. Multiple admissions for decompensated HF mostly in setting of recurrent AF.    Now presenting with a/c CHF.   Assessment/Plan     1. Acute on chronic Biventricular Systolic Heart Failure: - RHC (6/22) with low filling pressures with moderately reduced CO. - Echo (7/22) in setting of AFL with RVR, showed EF 30-35%, moderate LVH, moderate RV dysfunction, PASP 48, s/p MV repair with mild MR and mean gradient 10 mmHg with HR around 130, s/p TV repair with mild-moderate TR, dilated IVC.   - RHC 4/23 markedly elevated biventricular pressures - Echo 4/23 EF 35-40% RV moderately down severe TR. Moderate MS mean grad 6m HG - TEE 01/20/22: Normal LV EF 55%, severe RV dysfunction, trivial MR, mean gradient 3 across MV prosthesis, severe TR - Multiple admissions recently for HF mostly occur in setting of recurrent AF. Currently in NSR. - RHC 06/27 markedly elevated biventricular pressures, low PAPi, PA sats low but preserved Fick CO. - Repeat RHC 07/05 after diuresis - mild pulmonary venous hypertension, mildly elevated RA pressure, normal PCWP, preserved CO - NYHA IIIb/IV. Diuresed well with IV lasix + Metolazone. Now back to PO Torsemide 60 BID (prior home dose). Appears new dry weight is 267 lb.  - Previously taking 2.5 mg metolazone twice a week, recommend metolazone once a week (Monday) + PRN for weight gain or worsening LE edema - In  and out of AFL at recent visits. Maintaining SR this admit. - UNNA boots - GDMT limited by CKD.  - No Bidil for now.  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 Worry that she may end up with dialysis in the future.    2. PAF/AFL - She has failed Tikosyn. - She had Maze in 5/22 and DCCV in 6/22 & 04/19/21, last of which she had ERAF. - Multiple admissions recently for HF mostly  occur in setting of recurrent AF/AFL. Last DCCV 06/13.  - Currently maintaining SR - Continue amiodarone + Eliquis  - Unable to tolerate AF at all. Follows with EP. Not a candidate for Afib ablation or AV nodal ablation/PPM - Has OSA, awaiting split night study for CPAP titration.  - Keep K > 4.0 and Mg > 2.0.    3. CKD Stage IV   - Nephrology consulted.  - Baseline Scr likely 3-3.5, recent Scr 2-2.5 likely associated with dilution in setting of volume overload. - Scr 3.0 => 3.3 => 3.1 => 3.29 => 3.22 => 3.19 => 3.57 => 3.3 - IV diuretics have been switched to po as above.  - I worry that she will eventually need HD for volume management.  - No SGLT2i, GFR has been consistently less than 20   4. CAD - Cath (2019): Mild nonobstructive CAD - No chest pain.  - Continue statin + beta blocker - no ASA w/ Eliquis    5. Valvular Heart Disease: - S/p MV and TV repair in 5/22.  - Echo 02/25/21 with mild-moderate TR.   - Echo 4/23 35-40% RV moderately down. severe TR. Moderate MS mean gradient 64mHG  - TEE 06/23: LV EF 55% with severe RV dysfunction, trivial MR, mean gradient 3 across MV prosthesis, severe TR - Seen by structural heart team. Has been turned down for TV valve in ring due to markedly elevated wedge pressures despite diuresis.   6. Obesity - Body mass index is 48 - Discussed portion control.    7. DMII - Hgb A1c 6.7% 04/23 - GFR has been too low for SGLT2i   8. OSA - initial sleep study positive but was awaiting split night study to titrate  CPAP - Has been referred to Dr. TRadford Pax- suspect this is part of her AF recurrence   9. PVCs - ~ 15 PVCs/min on tele today - Continue PO amiodarone  - Keep K > 4 and Mag > 2. Supp K  10. Bacteriuria - Urine culture grew E.Coli and Enterobacter cloacae - Not currently treated d/t lack of symptoms  11. Anemia - Hgb 8s-9s - Iron stores low - Feraheme given this admit.   Outpatient PT recommended at discharge.  Stable for discharge from HF perspective. Has f/u in HF clinic, will need BMET/BNP and ECG.  HF meds at discharge: Amiodarone 200 mg daily Apixaban 5 mg BID Metoprolol xl 25 mg daily Rosuvastatin 10 mg daily Torsemide 60 mg BID Metolazone 2.5 mg once a week (Mondays), take extra tablet PRN for weight gain or LE edema Potassium chloride 60 mEq BID + extra 20 mEq when taking metolazone  Length of Stay: 12  FINCH, LINDSAY N, PA-C  02/24/2022, 7:58 AM  Advanced Heart Failure Team Pager [phone](M-F; 7a - 5p)  Please contact [facility] for night-coverage after hours (5p -7a ) and weekends on [email or web address]   Patient seen with PA, agree with the above note.   Volume status stable on po diuretic.  Feels good.  General: NAD Neck: No JVD, no thyromegaly or thyroid nodule.  Lungs: Clear to auscultation bilaterally with normal respiratory effort. CV: Nondisplaced PMI.  Heart regular S1/S2, no S3/S4, no murmur.  No peripheral edema.  N Abdomen: Soft, nontender, no hepatosplenomegaly, no distention.  Skin: Intact without lesions or rashes.  Neurologic: Alert and oriented x 3.  Psych: Normal affect. Extremities: No clubbing or cyanosis.  HEENT: Normal.   From my standpoint, she is ready for home.  Can go out on the above medication regimen with close followup in CHF clinic.  She should take metolazone once a week for now.   Loralie Champagne 02/24/2022

## 2022-02-24 NOTE — Care Management Important Message (Signed)
Important Message  Patient Details  Name: [name] MRN: [id number] Date of Birth: July 13, 1956   Medicare Important Message Given:  Yes     Shelda Altes 02/24/2022, 11:09 AM

## 2022-02-24 NOTE — Progress Notes (Signed)
Patient ID: [name], female   DOB: 04-21-1956, 66 y.o.   MRN: [id number] [facility] Progress Note   Assessment/ Plan:   1. Acute kidney Injury on chronic kidney disease stage IV: Baseline creatinine likely 3.0-3.5 (with lower creatinine likely an effect of dilution/hypovolemia).  Renal function essentially stable following transition from intravenous to oral diuretics.  Right heart catheterization indicated improved volume status.  She will be scheduled for outpatient nephrology follow-up with Dr. Joylene Grapes in 2 to 3 weeks. 2.  Acute exacerbation of systolic heart failure: With recurrent hospitalizations for CHF exacerbation and successful diuresis after transition to intravenous furosemide/oral metolazone.  Right heart catheterization done yesterday showed normal PCWP, mild pulmonary venous hypertension and mildly elevated RA pressure indicating volume status closer to euvolemia.  Transitioned back to torsemide yesterday and will defer need to restart metolazone to cardiology as an outpatient. 3.  Atrial fibrillation: Appears to be successfully rate controlled on metoprolol. 4.  Anemia: Likely secondary to chronic kidney disease as well as iron deficiency for which she got intravenous iron supplementation.  Stable to discharge home from nephrology standpoint.  We will sign off at this time and remain available for questions or concerns.  Subjective:   Reports to be feeling well without any chest pain or shortness of breath.  Leg swelling improved.   Objective:   BP 114/69 (BP Location: Right Wrist)   Pulse 82   Temp 98.2 F (36.8 C) (Oral)   Resp 18   Ht '5\' 7"'$  (1.702 m)   Wt 121.5 kg   SpO2 97%   BMI 41.96 kg/m   Intake/Output Summary (Last 24 hours) at 02/24/2022 0744 Last data filed at 02/24/2022 0559 Gross per 24 hour  Intake 600 ml  Output 2550 ml  Net -1950 ml   Weight change: -0.091 kg  Physical Exam: Gen: Comfortably sitting up in bed, eating  breakfast CVS: Pulse regular rhythm, normal rate, S1 and S2 normal Resp: Diminished breath sounds over bases, no distinct rales or rhonchi Abd: Soft, obese, nontender, bowel sounds normal Ext: Trace-1+ upper extremity edema with 1+ bilateral lower extremity edema  Imaging: CARDIAC CATHETERIZATION  Result Date: 02/22/2022 1. Normal PCWP 2. Mild pulmonary venous hypertension. 3. Mildly elevated RA pressure. 4. Preserved cardiac output.    Labs: BMET Recent Labs  Lab 02/18/22 0240 02/19/22 0429 02/20/22 0727 02/21/22 0144 02/22/22 0200 02/22/22 1447 02/22/22 1448 02/23/22 0353 02/24/22 0156  NA 142 140 140 137 140 141 142 140 139  K 3.8 3.5 4.1 3.9 3.7 4.1 4.1 4.0 3.7  CL 99 98 100 96* 94*  --   --  98 100  CO2 30 31 34* 31 33*  --   --  32 30  GLUCOSE 119* 99 90 121* 121*  --   --  127* 103*  BUN 42* 42* 44* 51* 56*  --   --  53* 50*  CREATININE 3.33* 3.10* 3.29* 3.22* 3.19*  --   --  3.57* 3.33*  CALCIUM 9.4 9.3 9.3 9.4 9.6  --   --  9.5 9.2  PHOS  --   --   --   --   --   --   --   --  4.1   CBC Recent Labs  Lab 02/20/22 0727 02/22/22 1447 02/22/22 1448 02/23/22 0353  WBC 5.1  --   --  6.5  HGB 8.9* 10.2* 10.5* 9.5*  HCT 29.0* 30.0* 31.0* 31.2*  MCV 83.8  --   --  83.9  PLT 217  --   --  247    Medications:     amiodarone  200 mg Oral Daily   apixaban  5 mg Oral BID   busPIRone  5 mg Oral BID   colchicine  0.3 mg Oral Daily   cycloSPORINE  1 drop Both Eyes BID   docusate sodium  100 mg Oral BID   DULoxetine  60 mg Oral Daily   fluticasone  2 spray Each Nare Daily   insulin aspart  0-9 Units Subcutaneous TID WC   loratadine  10 mg Oral Daily   metoprolol succinate  25 mg Oral Daily   multivitamin with minerals   Oral Daily   pantoprazole  40 mg Oral Daily   polyethylene glycol  17 g Oral Daily   potassium chloride SA  60 mEq Oral BID   rosuvastatin  10 mg Oral QPM   senna-docusate  1 tablet Oral BID   sodium chloride flush  3 mL Intravenous Q12H    sodium chloride flush  3 mL Intravenous Q12H   sodium chloride flush  3 mL Intravenous Q12H   torsemide  60 mg Oral BID    Elmarie Shiley, MD 02/24/2022, 7:44 AM

## 2022-02-24 NOTE — Discharge Summary (Signed)
Physician Discharge Summary  [name] IWP:[id number] DOB: Apr 16, 1956 DOA: 02/12/2022  PCP: [name], NP  Admit date: 02/12/2022 Discharge date: 02/24/2022  Admitted From: Home Disposition:  Home  Discharge Condition:Stable CODE STATUS:FULL Diet recommendation: Heart Healthy   Brief/Interim Summary:  Patient is a 66 year old female with history of systolic congestive heart failure, severe right ventricular failure, status post mitral valve repair, severe tricuspid regurgitation, chronic A-fib, CKD stage IV who presented here with orthopnea, PND, weight gain and found to have fluid overload.Started on IV diuresis,now transitioned to oral.  Cardiology, nephrology were following.  Plan for discharge today to home.  Medically stable for discharge.  Following problems were addressed during his hospitalization:  Acute on chronic biventricular systolic heart failure : Echocardiogram on 6/23 showed normal ejection fraction.  Right heart cath on 6/27 showed elevated biventricular pressure.  Presented with fluid overload, orthopnea, PND.  She was  on Lasix 160 mg twice a day along with metolazone.  Now transitioned to torsemide.  Being followed by advanced heart failure team.   Remains on room air. Repeat right heart cath on 7/5 showed mild pulmonary venous hypertension, mildly elevated RA pressure, normal PCWP, preserved cardiac output.  She will follow-up with outpatient heart failure clinic    Paroxysmal A-fib: Failed Tikosyn.  Status post maze procedure on 5/22.  Underwent multiple cardioversions.  Currently in normal sinus rhythm.  Continue amiodarone, on Eliquis for anticoagulation.  Also on metoprolol   CKD stage IV: Currently kidney function around baseline.  Monitor volume status.  Monitor BMP.  Nephrology following.  On torsemide 60 mg twice daily   Normocytic anemia: Most likely in the setting of chronic kidney disease.  Iron studies showed low iron.  Given IV iron   Coronary  artery disease: No anginal symptoms at present.  Continue statin, beta-blocker.   Diabetes type 2: On Trulicity at home Monitor blood sugars   Depression/anxiety: On Cymbalta, BuSpar   History of sleep apnea: Not on CPAP.  She each in the process of getting a new sleep study   Asymptomatic bacteriuria: Urine culture from 6/26 showed E. coli. Urinary symptoms.  Deferred treatment    Morbid obesity: BMI  of 45   Deconditioning: Patient was seen by PT earlier and recommended outpatient follow up.       Discharge Diagnoses:  Principal Problem:   Acute on chronic right heart failure (HCC) Active Problems:   AF (paroxysmal atrial fibrillation) (HCC)   Essential hypertension   Diabetes mellitus type 2 in obese (HCC)   Type 2 diabetes mellitus with hyperlipidemia (HCC)   Depression   Class 3 obesity (HCC)   NICM (nonischemic cardiomyopathy) ([place])   S/P mitral valve repair   Prolonged QT interval   Anxiety and depression   GERD (gastroesophageal reflux disease)    Discharge Instructions  Discharge Instructions     Diet - low sodium heart healthy   Complete by: As directed    Discharge instructions   Complete by: As directed    1)Take your medications as instructed. 2)Follow up with the heart failure clinic on 03/15/22   Increase activity slowly   Complete by: As directed       Allergies as of 02/24/2022       Reactions   Bee Pollen Anaphylaxis   Sulfa Antibiotics Itching        Medication List     STOP taking these medications    oxyCODONE 5 MG immediate release tablet Commonly known as:  Roxicodone       TAKE these medications    Accu-Chek Guide Me w/Device Kit Use as instructed to check blood sugar three times daily. E11.69   Accu-Chek Guide test strip Generic drug: glucose blood Use to check blood sugar TID. E11.69   acetaminophen 325 MG tablet Commonly known as: TYLENOL Take 2 tablets (650 mg total) by mouth every 6 (six) hours as needed for mild  pain or moderate pain.   albuterol 108 (90 Base) MCG/ACT inhaler Commonly known as: VENTOLIN HFA INHALE 1-2 PUFFS BY MOUTH EVERY 6 HOURS AS NEEDED FOR WHEEZE OR SHORTNESS OF BREATH What changed: See the new instructions.   amiodarone 200 MG tablet Commonly known as: PACERONE Take 1 tablet (200 mg total) by mouth daily.   B-D SINGLE USE SWABS REGULAR Pads 1 each by Other route daily.   Alcohol Swabs Pads 1 application by Does not apply route in the morning, at noon, and at bedtime.   BENADRYL PO Take 1 tablet by mouth daily as needed (itchyness).   blood glucose meter kit and supplies Dispense based on patient and insurance preference. Use up to four times daily as directed. (FOR ICD-10 E10.9, E11.9).   blood glucose meter kit and supplies Kit Dispense based on patient and insurance preference. Use up to four times daily as directed.   busPIRone 5 MG tablet Commonly known as: BUSPAR Take 1 tablet (5 mg total) by mouth 2 (two) times daily.   colchicine 0.6 MG tablet Take 0.5 tablets (0.3 mg total) by mouth daily.   DULoxetine 60 MG capsule Commonly known as: CYMBALTA Take 1 capsule (60 mg total) by mouth daily.   Eliquis 5 MG Tabs tablet Generic drug: apixaban TAKE ONE TABLET BY MOUTH TWICE DAILY @ 9AM & 5PM What changed: See the new instructions.   EPINEPHrine 0.3 mg/0.3 mL Soaj injection Commonly known as: EPI-PEN Inject 0.3 mg into the muscle as needed for anaphylaxis.   fluticasone 50 MCG/ACT nasal spray Commonly known as: FLONASE Place 2 sprays into both nostrils daily.   lidocaine 5 % ointment Commonly known as: XYLOCAINE Apply 1 application. topically as needed for moderate pain. First choice is lidocaine patch, but if not covered/expensive, please fill ointment What changed:  when to take this additional instructions   Linzess 72 MCG capsule Generic drug: linaclotide Take 72 mcg by mouth every morning.   loratadine 10 MG tablet Commonly known as:  CLARITIN Take 1 tablet (10 mg total) by mouth daily.   metolazone 2.5 MG tablet Commonly known as: ZAROXOLYN Take 1 tablet (2.5 mg total) by mouth once a week. Monday.  Take extra tablet as needed for weight gain or lower extremity edema. What changed:  when to take this additional instructions   metoprolol succinate 25 MG 24 hr tablet Commonly known as: TOPROL-XL Take 1 tablet (25 mg total) by mouth in the morning and at bedtime.   MULTIVITAMIN WOMEN PO Take 1 tablet by mouth daily.   nitroGLYCERIN 0.4 MG SL tablet Commonly known as: NITROSTAT Place 1 tablet (0.4 mg total) under the tongue every 5 (five) minutes x 3 doses as needed for chest pain.   NovoLOG 100 UNIT/ML injection Generic drug: insulin aspart Inject 2-10 Units into the skin 3 (three) times daily before meals. Per sliding scale   pantoprazole 40 MG tablet Commonly known as: PROTONIX TAKE ONE TABLET BY MOUTH DAILY AT 9AM   polyethylene glycol 17 g packet Commonly known as: MIRALAX / GLYCOLAX Take 17  g by mouth daily. Start taking on: February 25, 2022   potassium chloride SA 20 MEQ tablet Commonly known as: KLOR-CON M Take 3 tablets (60 mEq total) by mouth daily. Take an additional 20 meq with every dose of METOLAZONE   Restasis 0.05 % ophthalmic emulsion Generic drug: cycloSPORINE Place 1 drop into both eyes 2 (two) times daily.   rosuvastatin 10 MG tablet Commonly known as: CRESTOR Take 1 tablet (10 mg total) by mouth every evening.   senna-docusate 8.6-50 MG tablet Commonly known as: Senokot-S Take 1 tablet by mouth 2 (two) times daily.   torsemide 20 MG tablet Commonly known as: DEMADEX Take 3 tablets (60 mg total) by mouth 2 (two) times daily.   Trulicity 1.5 TD/3.2KG Sopn Generic drug: Dulaglutide Inject 1.5 mg into the skin every Wednesday.        Follow-up Information     [name], NP. Go on 02/24/2022.   Specialty: Internal Medicine Why: $RemoveBefor'@12'gZFzfCYXfPVA$ :50pm Contact information: [id number]  [place] [address] [phone]         Vickie Epley, MD .   Specialties: Cardiology, Radiology Contact information: [address] [place] [address] [phone]         [facility] Follow up on 03/15/2022.   Specialty: Cardiology Why: [facility] 8:30 am Entrance C, Free Valet Parking Contact information: [address] [id number] [place] [address] [phone]               Allergies  Allergen Reactions   Bee Pollen Anaphylaxis   Sulfa Antibiotics Itching    Consultations: Cardiology, nephrology   Procedures/Studies: CARDIAC CATHETERIZATION  Result Date: 02/22/2022 1. Normal PCWP 2. Mild pulmonary venous hypertension. 3. Mildly elevated RA pressure. 4. Preserved cardiac output.   CARDIAC CATHETERIZATION  Result Date: 02/14/2022 Findings: RA = 27 RV = 62/25 PA = 60/27 (41) PCW = 25 (v = 35) Fick cardiac output/index = 6.8/2.8 PVR = 2.4 WU Ao sat = 90% PA sat = 45%, 49% PAPi = 1.2 Assessment: 1. Markedly elevated biventricular pressures 2. Low PAPI suggestive of RV dysfunction 3. PA sats are low but Fick CO maintained Plan/Discussion: Diurese further as renal function tolerates. Glori Bickers, MD 2:29 PM  DG Chest Port 1 View  Result Date: 02/12/2022 CLINICAL DATA:  CHF and 2 leaky valves. Shortness of breath at rest since last evening. EXAM: PORTABLE CHEST 1 VIEW COMPARISON:  02/06/2022 FINDINGS: Previous median sternotomy and left atrial clipping. Unchanged cardiac enlargement. New pulmonary vascular congestion without frank edema. No convincing evidence for pleural effusion. No airspace consolidation. IMPRESSION: New pulmonary vascular congestion. Electronically Signed   By: Kerby Moors M.D.   On: 02/12/2022 12:31   DG Chest 2 View  Result Date: 02/06/2022 CLINICAL DATA:  Shortness of breath EXAM: CHEST - 2 VIEW COMPARISON:  Chest  x-ray 02/01/2022 FINDINGS: Cardiomegaly and mediastinum appear unchanged. Cardiac surgical changes and median sternotomy wires. Slight pulmonary vascular prominence. No focal consolidation identified. No pleural effusion or pneumothorax. IMPRESSION: Cardiomegaly and minimal pulmonary vascular prominence. Electronically Signed   By: Ofilia Neas M.D.   On: 02/06/2022 18:54   CT Head Wo Contrast  Result Date: 02/01/2022 CLINICAL DATA:  Head trauma, fall yesterday EXAM: CT HEAD WITHOUT CONTRAST TECHNIQUE: Contiguous axial images were obtained from the base of the skull through the vertex without intravenous contrast. RADIATION DOSE REDUCTION: This exam was performed according to the departmental  dose-optimization program which includes automated exposure control, adjustment of the mA and/or kV according to patient size and/or use of iterative reconstruction technique. COMPARISON:  11/28/2021 FINDINGS: Brain: No evidence of acute infarction, hemorrhage, hydrocephalus, extra-axial collection or mass lesion/mass effect. Vascular: No hyperdense vessel or unexpected calcification. Skull: Hyperostosis frontalis. Negative for fracture or focal lesion. Sinuses/Orbits: No acute finding. Other: None. IMPRESSION: No acute intracranial pathology. Electronically Signed   By: Delanna Ahmadi M.D.   On: 02/01/2022 15:57   DG Ankle Complete Right  Result Date: 02/01/2022 CLINICAL DATA:  Fall down steps.  Right ankle injury and pain. EXAM: RIGHT ANKLE - COMPLETE 3+ VIEW COMPARISON:  None Available. FINDINGS: There is no evidence of acute fracture, dislocation, or joint effusion. A well corticated ossific density is seen adjacent to the lateral malleolus, which may represent accessory ossicle or old avulsion injury. Prominent dorsal and plantar calcaneal bone spurs are incidentally noted. IMPRESSION: No acute findings. Prominent dorsal and plantar calcaneal bone spurs. Electronically Signed   By: Marlaine Hind M.D.   On:  02/01/2022 15:36   DG Chest Port 1 View  Result Date: 02/01/2022 CLINICAL DATA:  Right-sided chest pain EXAM: PORTABLE CHEST 1 VIEW COMPARISON:  01/17/2022 and CT chest 11/21/2021. FINDINGS: Trachea is midline. Heart is enlarged, stable. Left atrial appendage clip is in place. Tricuspid and mitral valve replacements. Streaky atelectasis in the left lower lobe. Lungs are otherwise clear. No pleural fluid. IMPRESSION: No acute findings. Electronically Signed   By: Lorin Picket M.D.   On: 02/01/2022 13:04      Subjective: Patient seen and examined the bedside this morning.  Hemodynamically stable for discharge today.  Discharge Exam: Vitals:   02/23/22 1955 02/24/22 0402  BP: 119/68 114/69  Pulse: 79 82  Resp: 14 18  Temp: 98.2 F (36.8 C) 98.2 F (36.8 C)  SpO2: 96% 97%   Vitals:   02/23/22 1031 02/23/22 1955 02/24/22 0402 02/24/22 0551  BP: 99/61 119/68 114/69   Pulse: 86 79 82   Resp: $Remo'20 14 18   'zIzfg$ Temp: 98.2 F (36.8 C) 98.2 F (36.8 C) 98.2 F (36.8 C)   TempSrc: Oral Oral Oral   SpO2: 96% 96% 97%   Weight:    121.5 kg  Height:        General: Pt is alert, awake, not in acute distress Cardiovascular: RRR, S1/S2 +, no rubs, no gallops Respiratory: CTA bilaterally, no wheezing, no rhonchi Abdominal: Soft, NT, ND, bowel sounds + Extremities: no edema, no cyanosis    The results of significant diagnostics from this hospitalization (including imaging, microbiology, ancillary and laboratory) are listed below for reference.     Microbiology: No results found for this or any previous visit (from the past 240 hour(s)).   Labs: BNP (last 3 results) Recent Labs    01/26/22 1156 02/06/22 1809 02/12/22 1226  BNP 375.6* 550.8* 168.3*   Basic Metabolic Panel: Recent Labs  Lab 02/20/22 0727 02/21/22 0144 02/22/22 0200 02/22/22 1447 02/22/22 1448 02/23/22 0353 02/24/22 0156  NA 140 137 140 141 142 140 139  K 4.1 3.9 3.7 4.1 4.1 4.0 3.7  CL 100 96* 94*  --   --   98 100  CO2 34* 31 33*  --   --  32 30  GLUCOSE 90 121* 121*  --   --  127* 103*  BUN 44* 51* 56*  --   --  53* 50*  CREATININE 3.29* 3.22* 3.19*  --   --  3.57* 3.33*  CALCIUM 9.3 9.4 9.6  --   --  9.5 9.2  MG  --  2.3  --   --   --  2.3  --   PHOS  --   --   --   --   --   --  4.1   Liver Function Tests: Recent Labs  Lab 02/24/22 0156  ALBUMIN 3.2*   No results for input(s): "LIPASE", "AMYLASE" in the last 168 hours. No results for input(s): "AMMONIA" in the last 168 hours. CBC: Recent Labs  Lab 02/20/22 0727 02/22/22 1447 02/22/22 1448 02/23/22 0353  WBC 5.1  --   --  6.5  HGB 8.9* 10.2* 10.5* 9.5*  HCT 29.0* 30.0* 31.0* 31.2*  MCV 83.8  --   --  83.9  PLT 217  --   --  247   Cardiac Enzymes: No results for input(s): "CKTOTAL", "CKMB", "CKMBINDEX", "TROPONINI" in the last 168 hours. BNP: Invalid input(s): "POCBNP" CBG: Recent Labs  Lab 02/23/22 1031 02/23/22 1559 02/23/22 2118 02/24/22 0556 02/24/22 1032  GLUCAP 166* 134* 209* 107* 187*   D-Dimer No results for input(s): "DDIMER" in the last 72 hours. Hgb A1c No results for input(s): "HGBA1C" in the last 72 hours. Lipid Profile No results for input(s): "CHOL", "HDL", "LDLCALC", "TRIG", "CHOLHDL", "LDLDIRECT" in the last 72 hours. Thyroid function studies No results for input(s): "TSH", "T4TOTAL", "T3FREE", "THYROIDAB" in the last 72 hours.  Invalid input(s): "FREET3" Anemia work up No results for input(s): "VITAMINB12", "FOLATE", "FERRITIN", "TIBC", "IRON", "RETICCTPCT" in the last 72 hours. Urinalysis    Component Value Date/Time   COLORURINE YELLOW 02/13/2022 0704   APPEARANCEUR CLEAR 02/13/2022 0704   LABSPEC 1.008 02/13/2022 0704   PHURINE 5.0 02/13/2022 0704   GLUCOSEU NEGATIVE 02/13/2022 0704   HGBUR NEGATIVE 02/13/2022 0704   BILIRUBINUR NEGATIVE 02/13/2022 0704   BILIRUBINUR negative 09/24/2019 1742   KETONESUR NEGATIVE 02/13/2022 0704   PROTEINUR NEGATIVE 02/13/2022 0704   UROBILINOGEN  1.0 09/24/2019 1742   UROBILINOGEN 1.0 10/26/2014 0249   NITRITE NEGATIVE 02/13/2022 0704   LEUKOCYTESUR NEGATIVE 02/13/2022 0704   Sepsis Labs Recent Labs  Lab 02/20/22 0727 02/23/22 0353  WBC 5.1 6.5   Microbiology No results found for this or any previous visit (from the past 240 hour(s)).  Please note: You were cared for by a hospitalist during your hospital stay. Once you are discharged, your primary care physician will handle any further medical issues. Please note that NO REFILLS for any discharge medications will be authorized once you are discharged, as it is imperative that you return to your primary care physician (or establish a relationship with a primary care physician if you do not have one) for your post hospital discharge needs so that they can reassess your need for medications and monitor your lab values.    Time coordinating discharge: 40 minutes  SIGNED:   Shelly Coss, MD  [facility] 02/24/2022, 10:53 AM Pager [phone]  If 7PM-7AM, please contact night-coverage [email or web address] Password [email or web address]

## 2022-02-25 ENCOUNTER — Other Ambulatory Visit (HOSPITAL_COMMUNITY): Payer: Self-pay | Admitting: Internal Medicine

## 2022-02-25 ENCOUNTER — Other Ambulatory Visit: Payer: Self-pay | Admitting: Family Medicine

## 2022-02-25 ENCOUNTER — Other Ambulatory Visit (HOSPITAL_COMMUNITY): Payer: Self-pay | Admitting: Family Medicine

## 2022-02-25 ENCOUNTER — Other Ambulatory Visit (INDEPENDENT_AMBULATORY_CARE_PROVIDER_SITE_OTHER): Payer: Self-pay | Admitting: Primary Care

## 2022-02-25 DIAGNOSIS — Z760 Encounter for issue of repeat prescription: Secondary | ICD-10-CM

## 2022-02-25 DIAGNOSIS — E1142 Type 2 diabetes mellitus with diabetic polyneuropathy: Secondary | ICD-10-CM

## 2022-02-25 DIAGNOSIS — F32A Depression, unspecified: Secondary | ICD-10-CM

## 2022-02-25 DIAGNOSIS — F4323 Adjustment disorder with mixed anxiety and depressed mood: Secondary | ICD-10-CM

## 2022-02-27 ENCOUNTER — Telehealth (INDEPENDENT_AMBULATORY_CARE_PROVIDER_SITE_OTHER): Payer: Self-pay | Admitting: Primary Care

## 2022-02-27 ENCOUNTER — Telehealth: Payer: Self-pay

## 2022-02-27 NOTE — Telephone Encounter (Signed)
Kenney Houseman calling from [facility] is calling to request a delay in starting [facility] to Wednesday 03/01/22 CB- [phone]

## 2022-02-27 NOTE — Telephone Encounter (Signed)
Katy w/ [facility] making provider aware she was not able to conduct visit w/ patient on 7-8 due to no one answering door for home visit  Valetta Fuller states she will continue trying but patient services are delayed because of missed visit

## 2022-02-27 NOTE — Telephone Encounter (Signed)
I just spoke to the patient and the home health agency.  They will try to reach her again.

## 2022-02-27 NOTE — Telephone Encounter (Signed)
Transition Care Management Follow-up Telephone Call Date of discharge and from where: 02/24/2022, [facility] How have you been since you were released from the hospital? She said she is a little weak; but pretty good.  Any questions or concerns? Yes- the air conditioning is not working in her home and has been out since she was in the hospital so she is now staying with a friend   Items Reviewed: Did the pt receive and understand the discharge instructions provided? Yes  Medications obtained and verified? Yes - she said she has all of her medications and a working glucometer and she did not have any questions about her med regime  Other? No  Any new allergies since your discharge? No  Dietary orders reviewed? Yes Do you have support at home? Yes   Home Care and Equipment/Supplies: Were home health services ordered? yes If so, what is the name of the agency? [facility]  Has the agency set up a time to come to the patient's home? The patient said she has not heard from anyone.  I called [facility] # [phone] and spoke to Candace who said that Valetta Fuller tried to contact the patient but  has not been successful. I explained that the patient is not staying at her house, she is staying with a friend and she said she tends not to answer the phone if she doesn't recognize the number. I confirmed patient's phone number with Candace.  Were any new equipment or medical supplies ordered?  No What is the name of the medical supply agency? N/a Were you able to get the supplies/equipment? not applicable Do you have any questions related to the use of the equipment or supplies? No  Functional Questionnaire: (I = Independent and D = Dependent) ADLs: has cane to use with ambulation. Independent with personal care   Follow up appointments reviewed:  PCP Hospital f/u appt confirmed? Yes  Scheduled to see Juluis Mire, NP - 03/13/2022   Specialist Hospital f/u appt confirmed? Yes  Scheduled to see [facility]  clinic - 03/15/2022.   Are transportation arrangements needed? No  If their condition worsens, is the pt aware to call PCP or go to the Emergency Dept.? Yes Was the patient provided with contact information for the PCP's office or ED? Yes Was to pt encouraged to call back with questions or concerns? Yes

## 2022-02-27 NOTE — Telephone Encounter (Signed)
Spoke with Kenney Houseman and informed that delaying until 7/12 is fine.

## 2022-02-27 NOTE — Telephone Encounter (Signed)
FYI

## 2022-02-28 ENCOUNTER — Other Ambulatory Visit (HOSPITAL_COMMUNITY): Payer: Self-pay | Admitting: Family Medicine

## 2022-03-07 ENCOUNTER — Other Ambulatory Visit (INDEPENDENT_AMBULATORY_CARE_PROVIDER_SITE_OTHER): Payer: Self-pay | Admitting: Primary Care

## 2022-03-08 ENCOUNTER — Emergency Department (HOSPITAL_BASED_OUTPATIENT_CLINIC_OR_DEPARTMENT_OTHER): Payer: 59

## 2022-03-08 ENCOUNTER — Other Ambulatory Visit: Payer: Self-pay

## 2022-03-08 ENCOUNTER — Ambulatory Visit (INDEPENDENT_AMBULATORY_CARE_PROVIDER_SITE_OTHER): Payer: Self-pay | Admitting: *Deleted

## 2022-03-08 ENCOUNTER — Inpatient Hospital Stay (HOSPITAL_BASED_OUTPATIENT_CLINIC_OR_DEPARTMENT_OTHER)
Admission: EM | Admit: 2022-03-08 | Discharge: 2022-03-18 | DRG: 264 | Disposition: A | Discharge status: Home / Self Care | Payer: 59 | Attending: Internal Medicine | Admitting: Internal Medicine

## 2022-03-08 ENCOUNTER — Encounter (HOSPITAL_BASED_OUTPATIENT_CLINIC_OR_DEPARTMENT_OTHER): Payer: Self-pay

## 2022-03-08 DIAGNOSIS — I5043 Acute on chronic combined systolic (congestive) and diastolic (congestive) heart failure: Secondary | ICD-10-CM | POA: Diagnosis present

## 2022-03-08 DIAGNOSIS — Z881 Allergy status to other antibiotic agents status: Secondary | ICD-10-CM

## 2022-03-08 DIAGNOSIS — I071 Rheumatic tricuspid insufficiency: Secondary | ICD-10-CM | POA: Diagnosis present

## 2022-03-08 DIAGNOSIS — E66813 Obesity, class 3: Secondary | ICD-10-CM | POA: Diagnosis present

## 2022-03-08 DIAGNOSIS — I6522 Occlusion and stenosis of left carotid artery: Secondary | ICD-10-CM | POA: Diagnosis present

## 2022-03-08 DIAGNOSIS — Z9841 Cataract extraction status, right eye: Secondary | ICD-10-CM

## 2022-03-08 DIAGNOSIS — Z794 Long term (current) use of insulin: Secondary | ICD-10-CM

## 2022-03-08 DIAGNOSIS — N186 End stage renal disease: Secondary | ICD-10-CM | POA: Diagnosis not present

## 2022-03-08 DIAGNOSIS — Z6841 Body Mass Index (BMI) 40.0 and over, adult: Secondary | ICD-10-CM

## 2022-03-08 DIAGNOSIS — I2510 Atherosclerotic heart disease of native coronary artery without angina pectoris: Secondary | ICD-10-CM | POA: Diagnosis present

## 2022-03-08 DIAGNOSIS — Z91030 Bee allergy status: Secondary | ICD-10-CM

## 2022-03-08 DIAGNOSIS — I722 Aneurysm of renal artery: Secondary | ICD-10-CM | POA: Diagnosis not present

## 2022-03-08 DIAGNOSIS — Z952 Presence of prosthetic heart valve: Secondary | ICD-10-CM

## 2022-03-08 DIAGNOSIS — I480 Paroxysmal atrial fibrillation: Secondary | ICD-10-CM | POA: Diagnosis not present

## 2022-03-08 DIAGNOSIS — I5082 Biventricular heart failure: Secondary | ICD-10-CM | POA: Diagnosis not present

## 2022-03-08 DIAGNOSIS — Z9842 Cataract extraction status, left eye: Secondary | ICD-10-CM

## 2022-03-08 DIAGNOSIS — D696 Thrombocytopenia, unspecified: Secondary | ICD-10-CM | POA: Diagnosis present

## 2022-03-08 DIAGNOSIS — I2721 Secondary pulmonary arterial hypertension: Secondary | ICD-10-CM | POA: Diagnosis present

## 2022-03-08 DIAGNOSIS — I440 Atrioventricular block, first degree: Secondary | ICD-10-CM | POA: Diagnosis not present

## 2022-03-08 DIAGNOSIS — Z7984 Long term (current) use of oral hypoglycemic drugs: Secondary | ICD-10-CM

## 2022-03-08 DIAGNOSIS — E785 Hyperlipidemia, unspecified: Secondary | ICD-10-CM | POA: Diagnosis present

## 2022-03-08 DIAGNOSIS — R079 Chest pain, unspecified: Principal | ICD-10-CM

## 2022-03-08 DIAGNOSIS — I132 Hypertensive heart and chronic kidney disease with heart failure and with stage 5 chronic kidney disease, or end stage renal disease: Secondary | ICD-10-CM | POA: Diagnosis not present

## 2022-03-08 DIAGNOSIS — I272 Pulmonary hypertension, unspecified: Secondary | ICD-10-CM

## 2022-03-08 DIAGNOSIS — E1169 Type 2 diabetes mellitus with other specified complication: Secondary | ICD-10-CM | POA: Diagnosis present

## 2022-03-08 DIAGNOSIS — Z7901 Long term (current) use of anticoagulants: Secondary | ICD-10-CM

## 2022-03-08 DIAGNOSIS — M898X9 Other specified disorders of bone, unspecified site: Secondary | ICD-10-CM | POA: Diagnosis present

## 2022-03-08 DIAGNOSIS — E1122 Type 2 diabetes mellitus with diabetic chronic kidney disease: Secondary | ICD-10-CM | POA: Diagnosis not present

## 2022-03-08 DIAGNOSIS — F32A Depression, unspecified: Secondary | ICD-10-CM | POA: Diagnosis present

## 2022-03-08 DIAGNOSIS — F419 Anxiety disorder, unspecified: Secondary | ICD-10-CM | POA: Diagnosis present

## 2022-03-08 DIAGNOSIS — N179 Acute kidney failure, unspecified: Secondary | ICD-10-CM | POA: Diagnosis present

## 2022-03-08 DIAGNOSIS — Z20822 Contact with and (suspected) exposure to covid-19: Secondary | ICD-10-CM | POA: Diagnosis present

## 2022-03-08 DIAGNOSIS — D631 Anemia in chronic kidney disease: Secondary | ICD-10-CM | POA: Diagnosis present

## 2022-03-08 DIAGNOSIS — E662 Morbid (severe) obesity with alveolar hypoventilation: Secondary | ICD-10-CM | POA: Diagnosis not present

## 2022-03-08 DIAGNOSIS — J449 Chronic obstructive pulmonary disease, unspecified: Secondary | ICD-10-CM | POA: Diagnosis present

## 2022-03-08 DIAGNOSIS — I509 Heart failure, unspecified: Secondary | ICD-10-CM

## 2022-03-08 DIAGNOSIS — Z86718 Personal history of other venous thrombosis and embolism: Secondary | ICD-10-CM

## 2022-03-08 DIAGNOSIS — I5033 Acute on chronic diastolic (congestive) heart failure: Secondary | ICD-10-CM | POA: Insufficient documentation

## 2022-03-08 DIAGNOSIS — Z803 Family history of malignant neoplasm of breast: Secondary | ICD-10-CM

## 2022-03-08 DIAGNOSIS — Z79899 Other long term (current) drug therapy: Secondary | ICD-10-CM

## 2022-03-08 DIAGNOSIS — R9431 Abnormal electrocardiogram [ECG] [EKG]: Secondary | ICD-10-CM

## 2022-03-08 DIAGNOSIS — K219 Gastro-esophageal reflux disease without esophagitis: Secondary | ICD-10-CM | POA: Diagnosis present

## 2022-03-08 DIAGNOSIS — R7989 Other specified abnormal findings of blood chemistry: Secondary | ICD-10-CM

## 2022-03-08 DIAGNOSIS — E669 Obesity, unspecified: Secondary | ICD-10-CM | POA: Diagnosis present

## 2022-03-08 DIAGNOSIS — M1990 Unspecified osteoarthritis, unspecified site: Secondary | ICD-10-CM | POA: Diagnosis present

## 2022-03-08 DIAGNOSIS — E876 Hypokalemia: Secondary | ICD-10-CM | POA: Diagnosis not present

## 2022-03-08 DIAGNOSIS — Z8249 Family history of ischemic heart disease and other diseases of the circulatory system: Secondary | ICD-10-CM

## 2022-03-08 DIAGNOSIS — I2781 Cor pulmonale (chronic): Secondary | ICD-10-CM | POA: Diagnosis present

## 2022-03-08 DIAGNOSIS — Z9049 Acquired absence of other specified parts of digestive tract: Secondary | ICD-10-CM

## 2022-03-08 LAB — CBC
HCT: 34.6 % — ABNORMAL LOW (ref 36.0–46.0)
Hemoglobin: 10.7 g/dL — ABNORMAL LOW (ref 12.0–15.0)
MCH: 26 pg (ref 26.0–34.0)
MCHC: 30.9 g/dL (ref 30.0–36.0)
MCV: 84 fL (ref 80.0–100.0)
Platelets: 184 10*3/uL (ref 150–400)
RBC: 4.12 MIL/uL (ref 3.87–5.11)
RDW: 18 % — ABNORMAL HIGH (ref 11.5–15.5)
WBC: 4.5 10*3/uL (ref 4.0–10.5)
nRBC: 0 % (ref 0.0–0.2)

## 2022-03-08 LAB — BASIC METABOLIC PANEL
Anion gap: 10 (ref 5–15)
BUN: 37 mg/dL — ABNORMAL HIGH (ref 8–23)
CO2: 29 mmol/L (ref 22–32)
Calcium: 10.1 mg/dL (ref 8.9–10.3)
Chloride: 104 mmol/L (ref 98–111)
Creatinine, Ser: 2.46 mg/dL — ABNORMAL HIGH (ref 0.44–1.00)
GFR, Estimated: 21 mL/min — ABNORMAL LOW (ref >60)
Glucose, Bld: 84 mg/dL (ref 70–99)
Potassium: 3.2 mmol/L — ABNORMAL LOW (ref 3.5–5.1)
Sodium: 143 mmol/L (ref 135–145)

## 2022-03-08 LAB — D-DIMER, QUANTITATIVE: D-Dimer, Quant: 0.7 ug/mL-FEU — ABNORMAL HIGH (ref 0.00–0.50)

## 2022-03-08 LAB — SARS CORONAVIRUS 2 BY RT PCR: SARS Coronavirus 2 by RT PCR: NEGATIVE

## 2022-03-08 LAB — TROPONIN I (HIGH SENSITIVITY)
Troponin I (High Sensitivity): 5 ng/L (ref <18)
Troponin I (High Sensitivity): 5 ng/L (ref <18)

## 2022-03-08 LAB — BRAIN NATRIURETIC PEPTIDE: B Natriuretic Peptide: 205.3 pg/mL — ABNORMAL HIGH (ref 0.0–100.0)

## 2022-03-08 MED ORDER — LIDOCAINE VISCOUS HCL 2 % MT SOLN
15.0000 mL | Freq: Once | OROMUCOSAL | Status: AC
  Administered 2022-03-08: 15 mL via ORAL
  Filled 2022-03-08: qty 15

## 2022-03-08 MED ORDER — NITROGLYCERIN 0.4 MG SL SUBL
0.4000 mg | SUBLINGUAL_TABLET | SUBLINGUAL | Status: DC | PRN
Start: 2022-03-08 — End: 2022-03-09
  Administered 2022-03-08 (×3): 0.4 mg via SUBLINGUAL
  Filled 2022-03-08: qty 1

## 2022-03-08 MED ORDER — ALUM & MAG HYDROXIDE-SIMETH 200-200-20 MG/5ML PO SUSP
30.0000 mL | Freq: Once | ORAL | Status: AC
  Administered 2022-03-08: 30 mL via ORAL
  Filled 2022-03-08: qty 30

## 2022-03-08 MED ORDER — FAMOTIDINE IN NACL 20-0.9 MG/50ML-% IV SOLN
20.0000 mg | Freq: Once | INTRAVENOUS | Status: AC
  Administered 2022-03-08: 20 mg via INTRAVENOUS
  Filled 2022-03-08: qty 50

## 2022-03-08 MED ORDER — FENTANYL CITRATE PF 50 MCG/ML IJ SOSY
50.0000 ug | PREFILLED_SYRINGE | Freq: Once | INTRAMUSCULAR | Status: AC
  Administered 2022-03-08: 50 ug via INTRAVENOUS
  Filled 2022-03-08: qty 1

## 2022-03-08 MED ORDER — IOHEXOL 350 MG/ML SOLN
100.0000 mL | Freq: Once | INTRAVENOUS | Status: AC | PRN
  Administered 2022-03-08: 100 mL via INTRAVENOUS

## 2022-03-08 NOTE — ED Triage Notes (Signed)
Patient here POV from Home.  Endorse CP that began 2-3 Days PTA. Associated with Dizziness, Nausea, Vomiting, SOB.  CP is Intermittent and is Mainly located in Mid Chest and radiates at Times to Left Back and Arm.   No Diarrhea. No Known Fevers.  NAD Noted during Triage. A&OX4. GCS 15. BIB Wheelchair.

## 2022-03-08 NOTE — ED Notes (Signed)
Patient ambulated to restroom. Steady gait. Pt reports feeling "like I was taking my last breath"

## 2022-03-08 NOTE — Telephone Encounter (Signed)
Requested medication (s) are due for refill today -no longer on current medication list  Requested medication (s) are on the active medication list -no  Future visit scheduled -yes  Last refill: 11/14/21  Notes to clinic: medication has been discontinued- sent for review   Requested Prescriptions  Pending Prescriptions Disp Refills   allopurinol (ZYLOPRIM) 100 MG tablet [Pharmacy Med Name: ALLOPURINOL 100 MG TABLET] 90 tablet 1    Sig: TAKE 1 [place]     Endocrinology:  Gout Agents - allopurinol Failed - 03/07/2022 12:14 PM      Failed - Uric Acid in normal range and within 360 days    No results found for: "POCURA", "LABURIC"       Failed - Cr in normal range and within 360 days    Creatinine, Ser  Date Value Ref Range Status  02/24/2022 3.33 (H) 0.44 - 1.00 mg/dL Final   Creatinine, Urine  Date Value Ref Range Status  07/29/2021 97.28 mg/dL Final    Comment:    Performed at [facility], [place] [address], [place], [place] [address]         Passed - Valid encounter within last 12 months    Recent Outpatient Visits           2 months ago Acute frontal sinusitis, recurrence not specified   [place], Michelle P, NP   5 months ago Hospital discharge follow-up   [place] [name], NP   7 months ago Chest pain due to myocardial ischemia, unspecified ischemic chest pain type   [facility] [name], NP   9 months ago Gastroesophageal reflux disease without esophagitis   [facility] [name], NP   10 months ago Erroneous encounter - disregard   [place] [name], NP       Future Appointments             In 5 days [name], NP [place] - CBC within normal limits and completed in the last 12 months    WBC  Date Value Ref  Range Status  02/23/2022 6.5 4.0 - 10.5 K/uL Final   RBC  Date Value Ref Range Status  02/23/2022 3.72 (L) 3.87 - 5.11 MIL/uL Final   Hemoglobin  Date Value Ref Range Status  02/23/2022 9.5 (L) 12.0 - 15.0 g/dL Final  04/14/2021 10.5 (L) 11.1 - 15.9 g/dL Final   Total hemoglobin  Date Value Ref Range Status  01/11/2021 9.6 (L) 12.0 - 16.0 g/dL Final   HCT  Date Value Ref Range Status  02/23/2022 31.2 (L) 36.0 - 46.0 % Final   Hematocrit  Date Value Ref Range Status  04/14/2021 32.5 (L) 34.0 - 46.6 % Final   MCHC  Date Value Ref Range Status  02/23/2022 30.4 30.0 - 36.0 g/dL Final   [facility]  Date Value Ref Range Status  02/23/2022 25.5 (L) 26.0 - 34.0 pg Final   MCV  Date Value Ref Range Status  02/23/2022 83.9 80.0 - 100.0 fL Final  04/14/2021 85 79 - 97 fL Final   No results found for: "PLTCOUNTKUC", "LABPLAT", "POCPLA" RDW  Date Value Ref Range Status  02/23/2022 16.4 (H) 11.5 - 15.5 % Final  04/14/2021 15.3 11.7 - 15.4 % Final  Requested Prescriptions  Pending Prescriptions Disp Refills   allopurinol (ZYLOPRIM) 100 MG tablet [Pharmacy Med Name: ALLOPURINOL 100 MG TABLET] 90 tablet 1    Sig: TAKE 1 TABLET BY [place] DAY     Endocrinology:  Gout Agents - allopurinol Failed - 03/07/2022 12:14 PM      Failed - Uric Acid in normal range and within 360 days    No results found for: "POCURA", "LABURIC"       Failed - Cr in normal range and within 360 days    Creatinine, Ser  Date Value Ref Range Status  02/24/2022 3.33 (H) 0.44 - 1.00 mg/dL Final   Creatinine, Urine  Date Value Ref Range Status  07/29/2021 97.28 mg/dL Final    Comment:    Performed at [facility], [place] [address] [place], [place] [address]         Passed - Valid encounter within last 12 months    Recent Outpatient Visits           2 months ago Acute frontal sinusitis, recurrence not specified   [place], Michelle P, NP   5  months ago Hospital discharge follow-up   [place] [name], NP   7 months ago Chest pain due to myocardial ischemia, unspecified ischemic chest pain type   [place] [facility] [name], NP   9 months ago Gastroesophageal reflux disease without esophagitis   [facility] [name], NP   10 months ago Erroneous encounter - disregard   [place] [name], NP       Future Appointments             In 5 days [name], NP [place] - CBC within normal limits and completed in the last 12 months    WBC  Date Value Ref Range Status  02/23/2022 6.5 4.0 - 10.5 K/uL Final   RBC  Date Value Ref Range Status  02/23/2022 3.72 (L) 3.87 - 5.11 MIL/uL Final   Hemoglobin  Date Value Ref Range Status  02/23/2022 9.5 (L) 12.0 - 15.0 g/dL Final  04/14/2021 10.5 (L) 11.1 - 15.9 g/dL Final   Total hemoglobin  Date Value Ref Range Status  01/11/2021 9.6 (L) 12.0 - 16.0 g/dL Final   HCT  Date Value Ref Range Status  02/23/2022 31.2 (L) 36.0 - 46.0 % Final   Hematocrit  Date Value Ref Range Status  04/14/2021 32.5 (L) 34.0 - 46.6 % Final   MCHC  Date Value Ref Range Status  02/23/2022 30.4 30.0 - 36.0 g/dL Final   [facility]  Date Value Ref Range Status  02/23/2022 25.5 (L) 26.0 - 34.0 pg Final   MCV  Date Value Ref Range Status  02/23/2022 83.9 80.0 - 100.0 fL Final  04/14/2021 85 79 - 97 fL Final   No results found for: "PLTCOUNTKUC", "LABPLAT", "POCPLA" RDW  Date Value Ref Range Status  02/23/2022 16.4 (H) 11.5 - 15.5 % Final  04/14/2021 15.3 11.7 - 15.4 % Final

## 2022-03-08 NOTE — ED Provider Notes (Signed)
[place] EMERGENCY DEPT Provider Note   CSN: [id number] Arrival date & time: 03/08/22  1431     History  Chief Complaint  Patient presents with   Chest Pain    ISHITA MCNERNEY is a 66 y.o. female.   Chest Pain Associated symptoms: shortness of breath     66 year old female with medical history significant for HTN, CHF with biventricular heart failure, DM 2, HLD, COPD, GERD, status post mitral valve repair, severe tricuspid regurgitation, chronic atrial fibrillation, CKD stage IV who presents to the emergency department with chest pain.  The patient states that she has had chest pain that has been ongoing since this past Monday.  She describes it as a pressure-like sensation and squeezing-like sensation.  She endorses some nausea and vomiting with associated shortness of breath.  She has trouble lying flat.  She endorses lightheadedness, no room spinning dizziness.  She denies any fevers or chills.  The pain is located in her mid chest and radiates to her left back and arm.  She has tried nitroglycerin at home with no relief.  Home Medications Prior to Admission medications   Medication Sig Start Date End Date Taking? Authorizing Provider  acetaminophen (TYLENOL) 325 MG tablet Take 2 tablets (650 mg total) by mouth every 6 (six) hours as needed for mild pain or moderate pain. 12/07/21   Arrien, Jimmy Picket, MD  albuterol (VENTOLIN HFA) 108 (90 Base) MCG/ACT inhaler INHALE 1-2 PUFFS BY MOUTH EVERY 6 HOURS AS NEEDED FOR WHEEZE OR SHORTNESS OF BREATH 02/24/22   Shelly Coss, MD  Alcohol Swabs (B-D SINGLE USE SWABS REGULAR) PADS 1 each by Other route daily. 07/06/21   [name], NP  Alcohol Swabs PADS 1 application by Does not apply route in the morning, at noon, and at bedtime. 09/14/21   [name], NP  amiodarone (PACERONE) 200 MG tablet Take 1 tablet (200 mg total) by mouth daily. 03/01/22   Bensimhon, Shaune Pascal, MD  apixaban (ELIQUIS) 5 MG TABS  tablet TAKE ONE TABLET BY MOUTH TWICE DAILY at [place] 02/24/22   Shelly Coss, MD  blood glucose meter kit and supplies KIT Dispense based on patient and insurance preference. Use up to four times daily as directed. 02/10/21   Patrecia Pour, MD  blood glucose meter kit and supplies Dispense based on patient and insurance preference. Use up to four times daily as directed. (FOR ICD-10 E10.9, E11.9). 07/06/21   [name], NP  Blood Glucose Monitoring Suppl (ACCU-CHEK GUIDE ME) w/Device KIT Use as instructed to check blood sugar three times daily. E11.69 04/21/20   Charlott Rakes, MD  busPIRone (BUSPAR) 5 MG tablet TAKE 1 TABLET BY MOUTH TWICE A DAY 02/26/22   [name], NP  colchicine 0.6 MG tablet Take 0.5 tablets (0.3 mg total) by mouth daily. 12/08/21   Charlott Rakes, MD  diphenhydrAMINE HCl (BENADRYL PO) Take 1 tablet by mouth daily as needed (itchyness).    [provider]  DULoxetine (CYMBALTA) 60 MG capsule TAKE 1 CAPSULE BY MOUTH EVERY DAY 02/26/22   [name], NP  EPINEPHrine 0.3 mg/0.3 mL IJ SOAJ injection Inject 0.3 mg into the muscle as needed for anaphylaxis. 06/25/20   [name], NP  fluticasone (FLONASE) 50 MCG/ACT nasal spray Place 2 sprays into both nostrils daily. 12/29/21   [name], NP  glucose blood (ACCU-CHEK GUIDE) test strip Use to check blood sugar TID. E11.69 07/13/21   Charlott Rakes,  MD  insulin aspart (NOVOLOG) 100 UNIT/ML injection Inject 2-10 Units into the skin 3 (three) times daily before meals. Per sliding scale 09/02/21   Aline August, MD  lidocaine (XYLOCAINE) 5 % ointment Apply 1 application. topically as needed for moderate pain. First choice is lidocaine patch, but if not covered/expensive, please fill ointment Patient taking differently: Apply 1 application  topically 2 (two) times daily as needed for moderate pain. 11/08/21   Buford Dresser, MD  LINZESS 72 MCG capsule Take 72 mcg by mouth every  morning. 02/11/22   [provider]  loratadine (CLARITIN) 10 MG tablet Take 1 tablet (10 mg total) by mouth daily. 12/29/21   [name], NP  metolazone (ZAROXOLYN) 2.5 MG tablet Take 1 tablet (2.5 mg total) by mouth once a week. Monday.  Take extra tablet as needed for weight gain or lower extremity edema. 02/24/22   Shelly Coss, MD  metoprolol succinate (TOPROL-XL) 25 MG 24 hr tablet TAKE 1 TABLET (25 MG) BY MOUTH IN THE MORNING AND AT BEDTIME 02/28/22   Bensimhon, Shaune Pascal, MD  Multiple Vitamins-Minerals (MULTIVITAMIN WOMEN PO) Take 1 tablet by mouth daily.    [provider]  nitroGLYCERIN (NITROSTAT) 0.4 MG SL tablet Place 1 tablet (0.4 mg total) under the tongue every 5 (five) minutes x 3 doses as needed for chest pain. 01/08/14   Charolette Forward, MD  pantoprazole (PROTONIX) 40 MG tablet TAKE ONE TABLET BY MOUTH DAILY AT 9AM 01/20/22   [name], NP  polyethylene glycol powder (GLYCOLAX/MIRALAX) 17 GM/SCOOP powder Take 17 g by mouth daily. 02/25/22   Shelly Coss, MD  potassium chloride SA (KLOR-CON M) 20 MEQ tablet Take 3 tablets (60 mEq total) by mouth daily. Take an additional 20 meq with every dose of METOLAZONE 01/27/22   Joette Catching, PA-C  pregabalin (LYRICA) 100 MG capsule TAKE 1 CAPSULE BY MOUTH THREE TIMES A DAY 02/26/22   [name], NP  RESTASIS 0.05 % ophthalmic emulsion Place 1 drop into both eyes 2 (two) times daily. 11/19/17   [provider]  rosuvastatin (CRESTOR) 10 MG tablet Take 1 tablet (10 mg total) by mouth every evening. 09/02/21   Aline August, MD  senna-docusate (SENOKOT-S) 8.6-50 MG tablet Take 1 tablet by mouth 2 (two) times daily. 02/24/22   Shelly Coss, MD  torsemide (DEMADEX) 20 MG tablet Take 4 tablets (80 mg total) by mouth daily. Can take an additional 2 tablets as needed for increased weight gain 02/28/22   Larey Dresser, MD  TRULICITY 1.5 ZO/1.0RU SOPN INJECT 1.5 MG INTO THE SKIN ONCE A WEEK. 02/26/22    [name], NP      Allergies    Bee pollen and Sulfa antibiotics    Review of Systems   Review of Systems  Respiratory:  Positive for shortness of breath.   Cardiovascular:  Positive for chest pain and leg swelling.  Neurological:  Positive for light-headedness.  All other systems reviewed and are negative.   Physical Exam Updated Vital Signs BP (!) 154/96 (BP Location: Right Arm)   Pulse 70   Temp 97.9 F (36.6 C) (Axillary)   Resp 16   Ht $R'5\' 7"'qF$  (1.702 m)   Wt 124.8 kg   SpO2 100%   BMI 43.09 kg/m  Physical Exam Vitals and nursing note reviewed.  Constitutional:      General: She is not in acute distress.    Appearance: She is well-developed. She is obese. She is  not ill-appearing.  HENT:     Head: Normocephalic and atraumatic.  Eyes:     Conjunctiva/sclera: Conjunctivae normal.  Neck:     Comments: Very mild JVD Cardiovascular:     Rate and Rhythm: Normal rate and regular rhythm.  Pulmonary:     Effort: Pulmonary effort is normal. No respiratory distress.     Breath sounds: Rales present.     Comments: Bibasilar rales Abdominal:     Palpations: Abdomen is soft.     Tenderness: There is no abdominal tenderness.  Musculoskeletal:        General: No swelling.     Cervical back: Neck supple.     Comments: Mild trace pitting edema bilaterally  Skin:    General: Skin is warm and dry.     Capillary Refill: Capillary refill takes less than 2 seconds.  Neurological:     Mental Status: She is alert.  Psychiatric:        Mood and Affect: Mood normal.     ED Results / Procedures / Treatments   Labs (all labs ordered are listed, but only abnormal results are displayed) Labs Reviewed  BASIC METABOLIC PANEL - Abnormal; Notable for the following components:      Result Value   Potassium 3.2 (*)    BUN 37 (*)    Creatinine, Ser 2.46 (*)    GFR, Estimated 21 (*)    All other components within normal limits  CBC - Abnormal; Notable for the following  components:   Hemoglobin 10.7 (*)    HCT 34.6 (*)    RDW 18.0 (*)    All other components within normal limits  BRAIN NATRIURETIC PEPTIDE - Abnormal; Notable for the following components:   B Natriuretic Peptide 205.3 (*)    All other components within normal limits  D-DIMER, QUANTITATIVE - Abnormal; Notable for the following components:   D-Dimer, Quant 0.70 (*)    All other components within normal limits  SARS CORONAVIRUS 2 BY RT PCR  TROPONIN I (HIGH SENSITIVITY)  TROPONIN I (HIGH SENSITIVITY)    EKG EKG Interpretation  Date/Time:  Wednesday March 08 2022 14:37:28 EDT Ventricular Rate:  76 PR Interval:    QRS Duration: 98 QT Interval:  484 QTC Calculation: 544 R Axis:   133 Text Interpretation: Sinus rhythm Incomplete right bundle branch block Left posterior fascicular block Cannot rule out Anterior infarct , age undetermined Prolonged QT Abnormal ECG When compared with ECG of 12-Feb-2022 11:58, PREVIOUS ECG IS PRESENT Confirmed by Regan Lemming ([id number]) on 03/08/2022 4:55:03 PM  Radiology CT Angio Chest/Abd/Pel for Dissection W and/or Wo Contrast  Result Date: 03/08/2022 CLINICAL DATA:  Acute aortic syndrome (AAS) EXAM: CT ANGIOGRAPHY CHEST, ABDOMEN AND PELVIS TECHNIQUE: Non-contrast CT of the chest was initially obtained. Multidetector CT imaging through the chest, abdomen and pelvis was performed using the standard protocol during bolus administration of intravenous contrast. Multiplanar reconstructed images and MIPs were obtained and reviewed to evaluate the vascular anatomy. RADIATION DOSE REDUCTION: This exam was performed according to the departmental dose-optimization program which includes automated exposure control, adjustment of the mA and/or kV according to patient size and/or use of iterative reconstruction technique. CONTRAST:  172mL OMNIPAQUE IOHEXOL 350 MG/ML SOLN COMPARISON:  CT chest 11/21/2021, CT abdomen pelvis 09/23/2021 FINDINGS: CTA CHEST FINDINGS Cardiovascular:  Mitral and tricuspid valve replacement has been performed. There is stable global cardiomegaly. No pericardial effusion. Left atrial clipping has been performed central pulmonary arteries are enlarged in keeping with changes of  pulmonary arterial hypertension, stable since prior examination. The thoracic aorta is normal in course and caliber there is no evidence of intramural hematoma, aneurysm, or dissection. Bovine arch anatomy with wide patency of the arch vasculature proximally. 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 Mediastinum/Nodes: No enlarged mediastinal, hilar, or axillary lymph nodes. Thyroid gland, trachea, and esophagus demonstrate no significant findings. Lungs/Pleura: Lungs are clear. No pleural effusion or pneumothorax. Musculoskeletal: No acute bone abnormality. No lytic or blastic bone lesion. Review of the MIP images confirms the above findings. CTA ABDOMEN AND PELVIS FINDINGS VASCULAR Aorta: Mild atherosclerotic calcification within the abdominal aorta. No aortic aneurysm or dissection. No periaortic inflammatory change. Celiac: Patent without evidence of aneurysm, dissection, vasculitis or significant stenosis. SMA: Patent without evidence of aneurysm, dissection, vasculitis or significant stenosis. Renals: Single renal arteries bilaterally. No evidence of hemodynamically significant stenosis. Normal vascular morphology. 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 IMA: Patent without evidence of aneurysm, dissection, vasculitis or significant stenosis. Inflow: Patent without evidence of aneurysm, dissection, vasculitis or significant stenosis.  Veins: No obvious venous abnormality within the limitations of this arterial phase study. Review of the MIP images confirms the above findings. NON-VASCULAR Hepatobiliary: No focal liver abnormality is seen. Status post cholecystectomy. No biliary dilatation. Pancreas: Unremarkable Spleen: Unremarkable Adrenals/Urinary Tract: Adrenal glands are unremarkable. Kidneys are normal, without renal calculi, focal lesion, or hydronephrosis. Bladder is unremarkable. Stomach/Bowel: Stomach is within normal limits. Appendix appears normal. No evidence of bowel wall thickening, distention, or inflammatory changes. Lymphatic: A shotty gastrohepatic lymph node is identified at axial image # 141/5 measuring 10 mm in short axis diameter, unchanged from prior examination though slightly enlarged since remote prior examination of 03/04/2020, nonspecific. No additional pathologic adenopathy is seen within the abdomen and pelvis. Reproductive: Status post hysterectomy. No adnexal masses. Other: No abdominal wall hernia Musculoskeletal: No acute bone abnormality. No lytic or blastic bone lesion. Review of the MIP images confirms the above findings. IMPRESSION: 1. No evidence of thoracoabdominal aortic aneurysm or dissection. 2. 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 This is incompletely evaluated on this examination. Correlation with carotid artery duplex sonography or CT arteriography may be helpful for further evaluation. 3. 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 Stable global cardiomegaly. 4. Stable enlargement of the central pulmonary arteries in keeping with changes of pulmonary arterial hypertension. 5. 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 The more proximal demonstrates a bilobed configuration with thrombosis of 1 of the lobes measuring 1.7 x 2.1 x 2.2 cm the second demonstrating broad communication with the feeding  vessel measures 1.3 x 1.6 x 1.4 cm in dimension. 6. Shotty gastrohepatic lymph node measuring 10 mm in short axis diameter, unchanged from prior examination though slightly enlarged since remote prior examination of 03/04/2020, nonspecific. Electronically Signed   By: Fidela Salisbury M.D.   On: 03/08/2022 20:02   DG Chest Portable 1 View  Result Date: 03/08/2022 CLINICAL DATA:  Provided history: Chest pain, shortness of breath. History of atrial fibrillation and congestive heart failure. Prior open heart surgery. EXAM: PORTABLE  CHEST 1 VIEW COMPARISON:  Prior chest radiographs 02/12/2022 and earlier. FINDINGS: Prior median sternotomy. Prior left atrial appendage clipping. Cardiomegaly. No appreciable airspace consolidation or pulmonary edema. No evidence of pleural effusion or pneumothorax. No acute bony abnormality identified. IMPRESSION: No evidence of acute cardiopulmonary abnormality. Cardiomegaly. Electronically Signed   By: Kellie Simmering D.O.   On: 03/08/2022 15:23    Procedures Procedures    Medications Ordered in ED Medications  nitroGLYCERIN (NITROSTAT) SL tablet 0.4 mg (0.4 mg Sublingual Given 03/08/22 1641)  alum & mag hydroxide-simeth (MAALOX/MYLANTA) 200-200-20 MG/5ML suspension 30 mL (30 mLs Oral Given 03/08/22 1801)    And  lidocaine (XYLOCAINE) 2 % viscous mouth solution 15 mL (15 mLs Oral Given 03/08/22 1803)  famotidine (PEPCID) IVPB 20 mg premix (0 mg Intravenous Stopped 03/08/22 1832)  fentaNYL (SUBLIMAZE) injection 50 mcg (50 mcg Intravenous Given 03/08/22 1805)  iohexol (OMNIPAQUE) 350 MG/ML injection 100 mL (100 mLs Intravenous Contrast Given 03/08/22 1915)    ED Course/ Medical Decision Making/ A&P                           Medical Decision Making Amount and/or Complexity of Data Reviewed Labs: ordered. Radiology: ordered.  Risk OTC drugs. Prescription drug management. Decision regarding hospitalization.    66 year old female with medical history significant for  HTN, CHF with biventricular heart failure, DM 2, HLD, COPD, GERD, status post mitral valve repair, severe tricuspid regurgitation, chronic atrial fibrillation, CKD stage IV who presents to the emergency department with chest pain.  The patient states that she has had chest pain that has been ongoing since this past Monday.  She describes it as a pressure-like sensation and squeezing-like sensation.  She endorses some nausea and vomiting with associated shortness of breath.  She has trouble lying flat.  She endorses lightheadedness, no room spinning dizziness.  She denies any fevers or chills.  The pain is located in her mid chest and radiates to her left back and arm.  She has tried nitroglycerin at home with no relief.  On arrival, the patient was afebrile, hemodynamically stable, mildly hypertensive BP 147/84, saturating well on room air.  Physical exam significant for mild JVD, trace pitting edema bilaterally, bibasilar rales in a patient with known CHF.  Differential diagnosis includes CHF exacerbation, ACS, PE.  Spoke with Dr. Marlou Porch, on-call cardiology regarding the patient and reviewed her work-up thus far.  She has a negative troponin and her BNP is downtrending.  Her exam is significant for bibasilar Rales, mild JVD, only trace pitting edema.  She is compliant with her medications.  She has had previous caths which showed nonobstructive CAD.  He feels that her symptoms are less likely to be cardiac in nature.  Her D-dimer did result elevated to 0.70.  Will obtain CT angio of the chest with and without contrast and reassess in addition to following up her second troponin.  Her COVID-19 PCR testing was negative.   On repeat assessment, the patient continues to endorse chest discomfort.  She has repeated the characterization of her discomfort today be described as sharp, mid chest radiating to her left back and left arm.  She endorses shortness of breath that is worse with exertion.  Pain has not been  improved with nitroglycerin.  Her differential includes PE and aortic dissection.  She does not have a pulse deficit on exam.  She does have CKD and her GFR is less than 30.  I discussed  with radiology regarding the patient.  Per their protocols she is not a candidate for contrast administration unless the benefit of contrast administration significantly outweighs the risk of worsening renal function.  I had a discussion with the patient so she can make an informed decision regarding CTA imaging versus observation.  Hospitalist medicine is concerned for possible dissection and would like this ruled out as well prior to admission.  After discussion, the patient is willing to proceed with CTA imaging with contrast.  The risk and benefits have been extensively discussed.  I believe that the benefit of ruling out an acute aortic syndrome outweighs the risk of worsening renal function at this time.  CTA Imaging results: IMPRESSION:  1. No evidence of thoracoabdominal aortic aneurysm or dissection.  2. 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 This is incompletely evaluated on this examination.  Correlation with carotid artery duplex sonography or CT  arteriography may be helpful for further evaluation.  3. Status post mitral and tricuspid valve replacement and left  atrial clipping. Stable global cardiomegaly.  4. Stable enlargement of the central pulmonary arteries in keeping  with changes of pulmonary arterial hypertension.  5. 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 The more proximal demonstrates a bilobed configuration  with thrombosis of 1 of the lobes measuring 1.7 x 2.1 x 2.2 cm the  second demonstrating broad communication with the feeding vessel  measures 1.3 x 1.6 x 1.4 cm in dimension.  6. Shotty gastrohepatic lymph node measuring 10 mm in short axis  diameter, unchanged from prior examination though slightly enlarged   since remote prior examination of 03/04/2020, nonspecific.    Dissection has been ruled out.  The patient continues to endorse chest discomfort, not reproducible to palpation, no rash present.  Recommended admission for further evaluation and monitoring.  Hospitalist medicine reconsulted for admission.   Final Clinical Impression(s) / ED Diagnoses Final diagnoses:  Chest pain, unspecified type  Positive D dimer  Pulmonary hypertension ([place])    Rx / DC Orders ED Discharge Orders     None         Regan Lemming, MD 03/09/22 0006

## 2022-03-08 NOTE — Telephone Encounter (Signed)
  Chief Complaint: Chest Pain Symptoms: Chest pressure, mid chest 9/10, comes and goes, duration of 20 minutes when occurs, nausea and dizziness, all onset Monday. Has to hold on to things to ambulate. Frequency: Monday Pertinent Negatives: Patient denies SOB, pain does not radiate Disposition: '[x]'$ ED /'[]'$ Urgent Care (no appt availability in office) / '[]'$ Appointment(In office/virtual)/ '[]'$  [facility]/ '[]'$ Home Care/ '[]'$ Refused Recommended Disposition /'[]'$ [facility]/ '[]'$  Follow-up with PCP Additional Notes: Estill Bamberg RN with [employer]. States had advised pt go to ED, declined. NT spoke with pt, triaged. Advised ED. Pt states will follow disposition. Reason for Disposition  Dizziness or lightheadedness  Answer Assessment - Initial Assessment Questions 1. LOCATION: "Where does it hurt?"       Middle of chest 2. RADIATION: "Does the pain go anywhere else?" (e.g., into neck, jaw, arms, back)     No 3. ONSET: "When did the chest pain begin?" (Minutes, hours or days)      Monday 4. PATTERN: "Does the pain come and go, or has it been constant since it started?"  "Does it get worse with exertion?"      Comes and goes 5. DURATION: "How long does it last" (e.g., seconds, minutes, hours)     20 minutes 6. SEVERITY: "How bad is the pain?"  (e.g., Scale 1-10; mild, moderate, or severe)    - MILD (1-3): doesn't interfere with normal activities     - MODERATE (4-7): interferes with normal activities or awakens from sleep    - SEVERE (8-10): excruciating pain, unable to do any normal activities       9/10 7. CARDIAC RISK FACTORS: "Do you have any history of heart problems or risk factors for heart disease?" (e.g., angina, prior heart attack; diabetes, high blood pressure, high cholesterol, smoker, or strong family history of heart disease)      8. PULMONARY RISK FACTORS: "Do you have any history of lung disease?"  (e.g., blood clots in lung, asthma, emphysema, birth control pills)       9. CAUSE: "What do you think is causing the chest pain?"      10. OTHER SYMPTOMS: "Do you have any other symptoms?" (e.g., dizziness, nausea, vomiting, sweating, fever, difficulty breathing, cough)       Dizzy, nausea since Monday  Protocols used: Chest Pain-A-AH

## 2022-03-09 ENCOUNTER — Observation Stay (HOSPITAL_BASED_OUTPATIENT_CLINIC_OR_DEPARTMENT_OTHER): Payer: 59

## 2022-03-09 DIAGNOSIS — I160 Hypertensive urgency: Secondary | ICD-10-CM | POA: Diagnosis not present

## 2022-03-09 DIAGNOSIS — E785 Hyperlipidemia, unspecified: Secondary | ICD-10-CM

## 2022-03-09 DIAGNOSIS — I5043 Acute on chronic combined systolic (congestive) and diastolic (congestive) heart failure: Secondary | ICD-10-CM | POA: Diagnosis not present

## 2022-03-09 DIAGNOSIS — I509 Heart failure, unspecified: Secondary | ICD-10-CM

## 2022-03-09 DIAGNOSIS — E876 Hypokalemia: Secondary | ICD-10-CM

## 2022-03-09 LAB — BASIC METABOLIC PANEL
Anion gap: 9 (ref 5–15)
Anion gap: 12 (ref 5–15)
BUN: 34 mg/dL — ABNORMAL HIGH (ref 8–23)
BUN: 32 mg/dL — ABNORMAL HIGH (ref 8–23)
CO2: 29 mmol/L (ref 22–32)
CO2: 29 mmol/L (ref 22–32)
Calcium: 9 mg/dL (ref 8.9–10.3)
Calcium: 9.2 mg/dL (ref 8.9–10.3)
Chloride: 103 mmol/L (ref 98–111)
Chloride: 99 mmol/L (ref 98–111)
Creatinine, Ser: 2.46 mg/dL — ABNORMAL HIGH (ref 0.44–1.00)
Creatinine, Ser: 2.83 mg/dL — ABNORMAL HIGH (ref 0.44–1.00)
GFR, Estimated: 21 mL/min — ABNORMAL LOW (ref >60)
GFR, Estimated: 18 mL/min — ABNORMAL LOW (ref >60)
Glucose, Bld: 96 mg/dL (ref 70–99)
Glucose, Bld: 131 mg/dL — ABNORMAL HIGH (ref 70–99)
Potassium: 2.8 mmol/L — ABNORMAL LOW (ref 3.5–5.1)
Potassium: 3.3 mmol/L — ABNORMAL LOW (ref 3.5–5.1)
Sodium: 141 mmol/L (ref 135–145)
Sodium: 140 mmol/L (ref 135–145)

## 2022-03-09 LAB — GLUCOSE, CAPILLARY
Glucose-Capillary: 105 mg/dL — ABNORMAL HIGH (ref 70–99)
Glucose-Capillary: 126 mg/dL — ABNORMAL HIGH (ref 70–99)
Glucose-Capillary: 127 mg/dL — ABNORMAL HIGH (ref 70–99)
Glucose-Capillary: 138 mg/dL — ABNORMAL HIGH (ref 70–99)
Glucose-Capillary: 143 mg/dL — ABNORMAL HIGH (ref 70–99)

## 2022-03-09 LAB — MAGNESIUM: Magnesium: 1.8 mg/dL (ref 1.7–2.4)

## 2022-03-09 MED ORDER — POTASSIUM CHLORIDE CRYS ER 20 MEQ PO TBCR
40.0000 meq | EXTENDED_RELEASE_TABLET | Freq: Once | ORAL | Status: AC
Start: 2022-03-09 — End: 2022-03-09
  Administered 2022-03-09: 40 meq via ORAL
  Filled 2022-03-09: qty 2

## 2022-03-09 MED ORDER — METOPROLOL SUCCINATE ER 25 MG PO TB24
25.0000 mg | ORAL_TABLET | Freq: Two times a day (BID) | ORAL | Status: DC
  Administered 2022-03-09 – 2022-03-16 (×15): 25 mg via ORAL
  Filled 2022-03-09 (×15): qty 1

## 2022-03-09 MED ORDER — FENTANYL CITRATE PF 50 MCG/ML IJ SOSY
12.5000 ug | PREFILLED_SYRINGE | Freq: Once | INTRAMUSCULAR | Status: AC
  Administered 2022-03-09: 12.5 ug via INTRAVENOUS
  Filled 2022-03-09: qty 1

## 2022-03-09 MED ORDER — INSULIN ASPART 100 UNIT/ML IJ SOLN: 0.0000 [IU] | Freq: Every day | INTRAMUSCULAR | Status: DC

## 2022-03-09 MED ORDER — AMIODARONE HCL 200 MG PO TABS
200.0000 mg | ORAL_TABLET | Freq: Every day | ORAL | Status: DC
  Administered 2022-03-09 – 2022-03-18 (×10): 200 mg via ORAL
  Filled 2022-03-09 (×10): qty 1

## 2022-03-09 MED ORDER — FUROSEMIDE 10 MG/ML IJ SOLN
120.0000 mg | Freq: Two times a day (BID) | INTRAVENOUS | Status: DC
  Administered 2022-03-09 – 2022-03-10 (×2): 120 mg via INTRAVENOUS
  Filled 2022-03-09: qty 12
  Filled 2022-03-09: qty 10
  Filled 2022-03-09: qty 12
  Filled 2022-03-09: qty 2
  Filled 2022-03-09: qty 12

## 2022-03-09 MED ORDER — HEPARIN SODIUM (PORCINE) 5000 UNIT/ML IJ SOLN
5000.0000 [IU] | Freq: Three times a day (TID) | INTRAMUSCULAR | Status: DC
  Administered 2022-03-09: 5000 [IU] via SUBCUTANEOUS
  Filled 2022-03-09: qty 1

## 2022-03-09 MED ORDER — ACETAMINOPHEN 650 MG RE SUPP: 650.0000 mg | Freq: Four times a day (QID) | RECTAL | Status: DC | PRN

## 2022-03-09 MED ORDER — POTASSIUM CHLORIDE CRYS ER 20 MEQ PO TBCR
40.0000 meq | EXTENDED_RELEASE_TABLET | Freq: Once | ORAL | Status: DC
Start: 2022-03-09 — End: 2022-03-09

## 2022-03-09 MED ORDER — ACETAMINOPHEN 325 MG PO TABS
650.0000 mg | ORAL_TABLET | Freq: Four times a day (QID) | ORAL | Status: DC | PRN
  Administered 2022-03-09 – 2022-03-15 (×5): 650 mg via ORAL
  Filled 2022-03-09 (×5): qty 2

## 2022-03-09 MED ORDER — METOLAZONE 5 MG PO TABS
2.5000 mg | ORAL_TABLET | Freq: Once | ORAL | Status: AC
Start: 2022-03-09 — End: 2022-03-09
  Administered 2022-03-09: 2.5 mg via ORAL
  Filled 2022-03-09: qty 1

## 2022-03-09 MED ORDER — APIXABAN 5 MG PO TABS
5.0000 mg | ORAL_TABLET | Freq: Two times a day (BID) | ORAL | Status: DC
  Administered 2022-03-09 – 2022-03-12 (×7): 5 mg via ORAL
  Filled 2022-03-09 (×8): qty 1

## 2022-03-09 MED ORDER — ROSUVASTATIN CALCIUM 5 MG PO TABS
10.0000 mg | ORAL_TABLET | Freq: Every day | ORAL | Status: DC
  Administered 2022-03-09 – 2022-03-18 (×10): 10 mg via ORAL
  Filled 2022-03-09 (×10): qty 2

## 2022-03-09 MED ORDER — INSULIN ASPART 100 UNIT/ML IJ SOLN
0.0000 [IU] | Freq: Three times a day (TID) | INTRAMUSCULAR | Status: DC
  Administered 2022-03-11 – 2022-03-17 (×4): 1 [IU] via SUBCUTANEOUS

## 2022-03-09 NOTE — H&P (Signed)
History and Physical    [name]:[id number] DOB: 09/24/1955 DOA: 03/08/2022  PCP: Grayce Sessions, NP  Patient coming from: [facility] ED  Chief Complaint: Chest pain  HPI: [name] is a 66 y.o. female with medical history significant of chronic biventricular systolic CHF, status post mitral and tricuspid valve replacement, paroxysmal A-fib status post maze procedure 5/22, CAD, insulin-dependent type 2 diabetes, CKD stage IV, iron deficiency anemia, depression/anxiety, OSA not on CPAP, class III obesity, COPD, hypertension, hyperlipidemia presented to the ED with complaints of chest pain and shortness of breath.  Blood pressure 147/84 on arrival to the ED, remainder of vital signs stable.  Labs significant for WBC 4.5, hemoglobin 10.7 (stable), platelet count 184k.  Sodium 143, potassium 3.2, chloride 104, bicarb 29, BUN 37, creatinine 2.4 (stable), glucose 84.  D-dimer 0.7.  High-sensitivity troponin negative x2.  BNP 205.  SARS-CoV-2 PCR negative.    CTA chest/abdomen/pelvis: "IMPRESSION: 1. No evidence of thoracoabdominal aortic aneurysm or dissection. 2. 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 This is incompletely evaluated on this examination. Correlation with carotid artery duplex sonography or CT arteriography may be helpful for further evaluation. 3. 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 Stable global cardiomegaly. 4. Stable enlargement of the central pulmonary arteries in keeping with changes of pulmonary arterial hypertension. 5. 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 The more proximal demonstrates a bilobed configuration with thrombosis of 1 of the lobes measuring 1.7 x 2.1 x 2.2 cm the second demonstrating broad communication with the feeding vessel measures 1.3 x 1.6 x 1.4 cm in dimension. 6. Shotty gastrohepatic lymph node measuring 10 mm in short  axis diameter, unchanged from prior examination though slightly enlarged since remote prior examination of 03/04/2020, nonspecific."  Patient was given GI cocktail, fentanyl, Pepcid, and sublingual nitroglycerin x3.  She continued to have chest pain and dyspnea on minimal exertion.  TRH called to admit.  Patient reports 3-day history of ongoing severe 8 out of 10 intensity nonexertional substernal pressure-like chest pain which radiates to her left arm and back.  Chest pain is associated with dizziness and nausea.  She is also endorsing dyspnea on minimal exertion along with orthopnea, bilateral lower extremity edema, and weight gain of 8 pounds over the past 2 weeks.  Denies increased dietary fluid or sodium intake.  She is taking torsemide 60 to 80 mg daily and metolazone 2.5 mg once a week.  No other complaints.  Review of Systems:  Review of Systems  All other systems reviewed and are negative.   Past Medical History:  Diagnosis Date   Acute on chronic systolic CHF (congestive heart failure) (HCC) 12/21/2020   Allergic rhinitis    Arthritis    Asthma    Chronic diastolic CHF (congestive heart failure) (HCC)    COPD (chronic obstructive pulmonary disease) (HCC)    Depression    DM (diabetes mellitus) (HCC)    DVT (deep vein thrombosis) in pregnancy    GERD (gastroesophageal reflux disease)    HTN (hypertension)    Hyperlipidemia    Obesity    Pneumonia 04/2018   RIGHT LOBE   Sleep apnea    compliant with CPAP    Past Surgical History:  Procedure Laterality Date   ABDOMINAL HYSTERECTOMY     BUBBLE STUDY  11/26/2020   Procedure: BUBBLE STUDY;  Surgeon: Jodelle Red, MD;  Location: [facility];  Service: Cardiovascular;;   CARDIOVERSION N/A 05/06/2020   Procedure: CARDIOVERSION;  Surgeon: Buford Dresser, MD;  Location: [place];  Service: Cardiovascular;  Laterality: N/A;   CARDIOVERSION N/A 11/04/2020   Procedure: CARDIOVERSION;  Surgeon: Lelon Perla, MD;  Location: [place];  Service: Cardiovascular;  Laterality: N/A;   CARDIOVERSION N/A 02/01/2021   Procedure: CARDIOVERSION;  Surgeon: Jolaine Artist, MD;  Location: [place];  Service: Cardiovascular;  Laterality: N/A;   CARDIOVERSION N/A 02/09/2021   Procedure: CARDIOVERSION;  Surgeon: Jolaine Artist, MD;  Location: [place];  Service: Cardiovascular;  Laterality: N/A;   CARDIOVERSION N/A 04/19/2021   Procedure: CARDIOVERSION;  Surgeon: Fay Records, MD;  Location: [place];  Service: Cardiovascular;  Laterality: N/A;   CARDIOVERSION N/A 11/25/2021   Procedure: CARDIOVERSION;  Surgeon: Jolaine Artist, MD;  Location: [place];  Service: Cardiovascular;  Laterality: N/A;   CARDIOVERSION N/A 01/31/2022   Procedure: CARDIOVERSION;  Surgeon: Jolaine Artist, MD;  Location: [place];  Service: Cardiovascular;  Laterality: N/A;   CATARACT EXTRACTION, BILATERAL     CHOLECYSTECTOMY     COLONOSCOPY WITH PROPOFOL N/A 03/05/2015   Procedure: COLONOSCOPY WITH PROPOFOL;  Surgeon: Carol Ada, MD;  Location: [facility];  Service: Endoscopy;  Laterality: N/A;   DIAGNOSTIC LAPAROSCOPY     ingrown hallux Left    KNEE SURGERY     LEFT HEART CATH AND CORONARY ANGIOGRAPHY N/A 12/31/2017   Procedure: LEFT HEART CATH AND CORONARY ANGIOGRAPHY;  Surgeon: Charolette Forward, MD;  Location: [place] [facility];  Service: Cardiovascular;  Laterality: N/A;   MAZE N/A 12/29/2020   Procedure: MAZE;  Surgeon: [name], MD;  Location: [place];  Service: Open Heart Surgery;  Laterality: N/A;   MITRAL VALVE REPAIR N/A 12/29/2020   Procedure: MITRAL VALVE REPAIR USING CARBOMEDICS ANNULOFLEX RING SIZE 30;  Surgeon: [name], MD;  Location: [place];  Service: Open Heart Surgery;  Laterality: N/A;   RIGHT HEART CATH N/A 12/22/2020   Procedure: RIGHT HEART CATH;  Surgeon: Larey Dresser, MD;  Location: [place] [facility];  Service: Cardiovascular;  Laterality: N/A;    RIGHT HEART CATH N/A 01/31/2021   Procedure: RIGHT HEART CATH;  Surgeon: Jolaine Artist, MD;  Location: [place] [facility];  Service: Cardiovascular;  Laterality: N/A;   RIGHT HEART CATH N/A 07/29/2021   Procedure: RIGHT HEART CATH;  Surgeon: Jolaine Artist, MD;  Location: [place] [facility];  Service: Cardiovascular;  Laterality: N/A;   RIGHT HEART CATH N/A 11/24/2021   Procedure: RIGHT HEART CATH;  Surgeon: Jolaine Artist, MD;  Location: [place] [facility];  Service: Cardiovascular;  Laterality: N/A;   RIGHT HEART CATH N/A 02/14/2022   Procedure: RIGHT HEART CATH;  Surgeon: Jolaine Artist, MD;  Location: [place] [facility];  Service: Cardiovascular;  Laterality: N/A;   RIGHT HEART CATH N/A 02/22/2022   Procedure: RIGHT HEART CATH;  Surgeon: Larey Dresser, MD;  Location: [place] [facility];  Service: Cardiovascular;  Laterality: N/A;   TEE WITHOUT CARDIOVERSION N/A 11/26/2020   Procedure: TRANSESOPHAGEAL ECHOCARDIOGRAM (TEE);  Surgeon: Buford Dresser, MD;  Location: [facility];  Service: Cardiovascular;  Laterality: N/A;   TEE WITHOUT CARDIOVERSION N/A 12/29/2020   Procedure: TRANSESOPHAGEAL ECHOCARDIOGRAM (TEE);  Surgeon: [name], MD;  Location: [place];  Service: Open Heart Surgery;  Laterality: N/A;   TEE WITHOUT CARDIOVERSION N/A 01/20/2022   Procedure: TRANSESOPHAGEAL ECHOCARDIOGRAM (TEE);  Surgeon: Jolaine Artist, MD;  Location: [place];  Service: Cardiovascular;  Laterality: N/A;   TRICUSPID VALVE REPLACEMENT N/A 12/29/2020   Procedure: 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 RING SIZE 34;  Surgeon: [name], MD;  Location: [place];  Service: Open Heart Surgery;  Laterality: N/A;     reports that she has never smoked. She has never used smokeless tobacco. She reports that she does not drink alcohol and does not use drugs.  Allergies  Allergen Reactions   Bee Pollen Anaphylaxis   Sulfa Antibiotics Itching    Family  History  Problem Relation Age of Onset   Breast cancer Mother    Cancer Mother    Hypertension Mother    Cancer Father    Hypertension Sister    Hypertension Brother    CAD Other     Prior to Admission medications   Medication Sig Start Date End Date Taking? Authorizing Provider  acetaminophen (TYLENOL) 325 MG tablet Take 2 tablets (650 mg total) by mouth every 6 (six) hours as needed for mild pain or moderate pain. 12/07/21   Arrien, Jimmy Picket, MD  albuterol (VENTOLIN HFA) 108 (90 Base) MCG/ACT inhaler INHALE 1-2 PUFFS BY MOUTH EVERY 6 HOURS AS NEEDED FOR WHEEZE OR SHORTNESS OF BREATH 02/24/22   Shelly Coss, MD  Alcohol Swabs (B-D SINGLE USE SWABS REGULAR) PADS 1 each by Other route daily. 07/06/21   [name], NP  Alcohol Swabs PADS 1 application by Does not apply route in the morning, at noon, and at bedtime. 09/14/21   [name], NP  amiodarone (PACERONE) 200 MG tablet Take 1 tablet (200 mg total) by mouth daily. 03/01/22   Bensimhon, Shaune Pascal, MD  apixaban (ELIQUIS) 5 MG TABS tablet TAKE ONE TABLET BY MOUTH TWICE DAILY at [place] 02/24/22   Shelly Coss, MD  blood glucose meter kit and supplies KIT Dispense based on patient and insurance preference. Use up to four times daily as directed. 02/10/21   Patrecia Pour, MD  blood glucose meter kit and supplies Dispense based on patient and insurance preference. Use up to four times daily as directed. (FOR ICD-10 E10.9, E11.9). 07/06/21   [name], NP  Blood Glucose Monitoring Suppl (ACCU-CHEK GUIDE ME) w/Device KIT Use as instructed to check blood sugar three times daily. E11.69 04/21/20   Charlott Rakes, MD  busPIRone (BUSPAR) 5 MG tablet TAKE 1 TABLET BY MOUTH TWICE A DAY 02/26/22   [name], NP  colchicine 0.6 MG tablet Take 0.5 tablets (0.3 mg total) by mouth daily. 12/08/21   Charlott Rakes, MD  diphenhydrAMINE HCl (BENADRYL PO) Take 1 tablet by mouth daily as needed (itchyness).    [provider]  DULoxetine (CYMBALTA) 60 MG capsule TAKE 1 CAPSULE BY MOUTH EVERY DAY 02/26/22   [name], NP  EPINEPHrine 0.3 mg/0.3 mL IJ SOAJ injection Inject 0.3 mg into the muscle as needed for anaphylaxis. 06/25/20   [name], NP  fluticasone (FLONASE) 50 MCG/ACT nasal spray Place 2 sprays into both nostrils daily. 12/29/21   [name], NP  glucose blood (ACCU-CHEK GUIDE) test strip Use to check blood sugar TID. E11.69 07/13/21   Charlott Rakes, MD  insulin aspart (NOVOLOG) 100 UNIT/ML injection Inject 2-10 Units into the skin 3 (three) times daily before meals. Per sliding scale 09/02/21   Aline August, MD  lidocaine (XYLOCAINE) 5 % ointment Apply 1 application. topically as needed for moderate pain. First choice is lidocaine  patch, but if not covered/expensive, please fill ointment Patient taking differently: Apply 1 application  topically 2 (two) times daily as needed for moderate pain. 11/08/21   Buford Dresser, MD  LINZESS 72 MCG capsule Take 72 mcg by mouth every morning. 02/11/22   [provider]  loratadine (CLARITIN) 10 MG tablet Take 1 tablet (10 mg total) by mouth daily. 12/29/21   [name], NP  metolazone (ZAROXOLYN) 2.5 MG tablet Take 1 tablet (2.5 mg total) by mouth once a week. Monday.  Take extra tablet as needed for weight gain or lower extremity edema. 02/24/22   Shelly Coss, MD  metoprolol succinate (TOPROL-XL) 25 MG 24 hr tablet TAKE 1 TABLET (25 MG) BY MOUTH IN THE MORNING AND AT BEDTIME 02/28/22   Bensimhon, Shaune Pascal, MD  Multiple Vitamins-Minerals (MULTIVITAMIN WOMEN PO) Take 1 tablet by mouth daily.    [provider]  nitroGLYCERIN (NITROSTAT) 0.4 MG SL tablet Place 1 tablet (0.4 mg total) under the tongue every 5 (five) minutes x 3 doses as needed for chest pain. 01/08/14   Charolette Forward, MD  pantoprazole (PROTONIX) 40 MG tablet TAKE ONE TABLET BY MOUTH DAILY AT 9AM 01/20/22   [name], NP   polyethylene glycol powder (GLYCOLAX/MIRALAX) 17 GM/SCOOP powder Take 17 g by mouth daily. 02/25/22   Shelly Coss, MD  potassium chloride SA (KLOR-CON M) 20 MEQ tablet Take 3 tablets (60 mEq total) by mouth daily. Take an additional 20 meq with every dose of METOLAZONE 01/27/22   Joette Catching, PA-C  pregabalin (LYRICA) 100 MG capsule TAKE 1 CAPSULE BY MOUTH THREE TIMES A DAY 02/26/22   [name], NP  RESTASIS 0.05 % ophthalmic emulsion Place 1 drop into both eyes 2 (two) times daily. 11/19/17   [provider]  rosuvastatin (CRESTOR) 10 MG tablet Take 1 tablet (10 mg total) by mouth every evening. 09/02/21   Aline August, MD  senna-docusate (SENOKOT-S) 8.6-50 MG tablet Take 1 tablet by mouth 2 (two) times daily. 02/24/22   Shelly Coss, MD  torsemide (DEMADEX) 20 MG tablet Take 4 tablets (80 mg total) by mouth daily. Can take an additional 2 tablets as needed for increased weight gain 02/28/22   Larey Dresser, MD  TRULICITY 1.5 SL/3.7DS SOPN INJECT 1.5 MG INTO THE SKIN ONCE A WEEK. 02/26/22   [name], NP    Physical Exam: Vitals:   03/08/22 2015 03/08/22 2130 03/08/22 2246 03/09/22 0438  BP:  (!) 138/92 (!) 154/96 118/66  Pulse:  71 70 71  Resp:  17 16   Temp: (!) 97.4 F (36.3 C)  97.9 F (36.6 C) 97.7 F (36.5 C)  TempSrc:   Axillary Oral  SpO2:  98% 100% 99%  Weight:   124.8 kg   Height:   '5\' 7"'$  (1.702 m)     Physical Exam Vitals reviewed.  Constitutional:      General: She is not in acute distress. HENT:     Head: Normocephalic and atraumatic.  Eyes:     Extraocular Movements: Extraocular movements intact.  Cardiovascular:     Rate and Rhythm: Normal rate and regular rhythm.     Pulses: Normal pulses.  Pulmonary:     Effort: Pulmonary effort is normal. No respiratory distress.     Breath sounds: Rales present. No wheezing.  Abdominal:     General: Bowel sounds are normal. There is no distension.     Palpations: Abdomen is soft.  Tenderness: There is no abdominal tenderness.  Musculoskeletal:     Cervical back: Normal range of motion.     Right lower leg: Edema present.     Left lower leg: Edema present.  Skin:    General: Skin is warm and dry.  Neurological:     General: No focal deficit present.     Mental Status: She is alert and oriented to person, place, and time.      Labs on Admission: I have personally reviewed following labs and imaging studies  CBC: Recent Labs  Lab 03/08/22 1503  WBC 4.5  HGB 10.7*  HCT 34.6*  MCV 84.0  PLT 287   Basic Metabolic Panel: Recent Labs  Lab 03/08/22 1503  NA 143  K 3.2*  CL 104  CO2 29  GLUCOSE 84  BUN 37*  CREATININE 2.46*  CALCIUM 10.1   GFR: Estimated Creatinine Clearance: 31.3 mL/min (A) (by C-G formula based on SCr of 2.46 mg/dL (H)). Liver Function Tests: No results for input(s): "AST", "ALT", "ALKPHOS", "BILITOT", "PROT", "ALBUMIN" in the last 168 hours. No results for input(s): "LIPASE", "AMYLASE" in the last 168 hours. No results for input(s): "AMMONIA" in the last 168 hours. Coagulation Profile: No results for input(s): "INR", "PROTIME" in the last 168 hours. Cardiac Enzymes: No results for input(s): "CKTOTAL", "CKMB", "CKMBINDEX", "TROPONINI" in the last 168 hours. BNP (last 3 results) No results for input(s): "PROBNP" in the last 8760 hours. HbA1C: No results for input(s): "HGBA1C" in the last 72 hours. CBG: Recent Labs  Lab 03/09/22 0012  GLUCAP 138*   Lipid Profile: No results for input(s): "CHOL", "HDL", "LDLCALC", "TRIG", "CHOLHDL", "LDLDIRECT" in the last 72 hours. Thyroid Function Tests: No results for input(s): "TSH", "T4TOTAL", "FREET4", "T3FREE", "THYROIDAB" in the last 72 hours. Anemia Panel: No results for input(s): "VITAMINB12", "FOLATE", "FERRITIN", "TIBC", "IRON", "RETICCTPCT" in the last 72 hours. Urine analysis:    Component Value Date/Time   COLORURINE YELLOW 02/13/2022 0704   APPEARANCEUR CLEAR 02/13/2022  0704   LABSPEC 1.008 02/13/2022 0704   PHURINE 5.0 02/13/2022 0704   GLUCOSEU NEGATIVE 02/13/2022 0704   HGBUR NEGATIVE 02/13/2022 0704   BILIRUBINUR NEGATIVE 02/13/2022 0704   BILIRUBINUR negative 09/24/2019 1742   KETONESUR NEGATIVE 02/13/2022 0704   PROTEINUR NEGATIVE 02/13/2022 0704   UROBILINOGEN 1.0 09/24/2019 1742   UROBILINOGEN 1.0 10/26/2014 0249   NITRITE NEGATIVE 02/13/2022 0704   LEUKOCYTESUR NEGATIVE 02/13/2022 0704    Radiological Exams on Admission: I have personally reviewed images CT Angio Chest/Abd/Pel for Dissection W and/or Wo Contrast  Result Date: 03/08/2022 CLINICAL DATA:  Acute aortic syndrome (AAS) EXAM: CT ANGIOGRAPHY CHEST, ABDOMEN AND PELVIS TECHNIQUE: Non-contrast CT of the chest was initially obtained. Multidetector CT imaging through the chest, abdomen and pelvis was performed using the standard protocol during bolus administration of intravenous contrast. Multiplanar reconstructed images and MIPs were obtained and reviewed to evaluate the vascular anatomy. RADIATION DOSE REDUCTION: This exam was performed according to the departmental dose-optimization program which includes automated exposure control, adjustment of the mA and/or kV according to patient size and/or use of iterative reconstruction technique. CONTRAST:  164mL OMNIPAQUE IOHEXOL 350 MG/ML SOLN COMPARISON:  CT chest 11/21/2021, CT abdomen pelvis 09/23/2021 FINDINGS: CTA CHEST FINDINGS Cardiovascular: Mitral and tricuspid valve replacement has been performed. There is stable global cardiomegaly. No pericardial effusion. Left atrial clipping has been performed central pulmonary arteries are enlarged in keeping with changes of pulmonary arterial hypertension, stable since prior examination. The thoracic aorta is normal in  course and caliber there is no evidence of intramural hematoma, aneurysm, or dissection. Bovine arch anatomy with wide patency of the arch vasculature proximally. 50% stenosis of the left  cervical internal carotid artery at its origin, noted at the superior margin of the examination. Mediastinum/Nodes: No enlarged mediastinal, hilar, or axillary lymph nodes. Thyroid gland, trachea, and esophagus demonstrate no significant findings. Lungs/Pleura: Lungs are clear. No pleural effusion or pneumothorax. Musculoskeletal: No acute bone abnormality. No lytic or blastic bone lesion. Review of the MIP images confirms the above findings. CTA ABDOMEN AND PELVIS FINDINGS VASCULAR Aorta: Mild atherosclerotic calcification within the abdominal aorta. No aortic aneurysm or dissection. No periaortic inflammatory change. Celiac: Patent without evidence of aneurysm, dissection, vasculitis or significant stenosis. SMA: Patent without evidence of aneurysm, dissection, vasculitis or significant stenosis. Renals: Single renal arteries bilaterally. No evidence of hemodynamically significant stenosis. Normal vascular morphology. 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 IMA: Patent without evidence of aneurysm, dissection, vasculitis or significant stenosis. Inflow: Patent without evidence of aneurysm, dissection, vasculitis or significant stenosis. Veins: No obvious venous abnormality within the limitations of this arterial phase study. Review of the MIP images confirms the above findings. NON-VASCULAR Hepatobiliary: No focal liver abnormality is seen. Status post cholecystectomy. No biliary dilatation. Pancreas: Unremarkable Spleen: Unremarkable Adrenals/Urinary Tract: Adrenal glands are unremarkable. Kidneys are normal, without renal calculi, focal lesion, or hydronephrosis. Bladder is unremarkable. Stomach/Bowel: Stomach is within normal limits.  Appendix appears normal. No evidence of bowel wall thickening, distention, or inflammatory changes. Lymphatic: A shotty gastrohepatic lymph node is identified at axial image # 141/5 measuring 10 mm in short axis diameter, unchanged from prior examination though slightly enlarged since remote prior examination of 03/04/2020, nonspecific. No additional pathologic adenopathy is seen within the abdomen and pelvis. Reproductive: Status post hysterectomy. No adnexal masses. Other: No abdominal wall hernia Musculoskeletal: No acute bone abnormality. No lytic or blastic bone lesion. Review of the MIP images confirms the above findings. IMPRESSION: 1. No evidence of thoracoabdominal aortic aneurysm or dissection. 2. 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 This is incompletely evaluated on this examination. Correlation with carotid artery duplex sonography or CT arteriography may be helpful for further evaluation. 3. 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 Stable global cardiomegaly. 4. Stable enlargement of the central pulmonary arteries in keeping with changes of pulmonary arterial hypertension. 5. 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 The more proximal demonstrates a bilobed configuration with thrombosis of 1 of the lobes measuring 1.7 x 2.1 x 2.2 cm the second demonstrating broad communication with the feeding vessel measures 1.3 x 1.6 x 1.4 cm in dimension. 6. Shotty gastrohepatic lymph node measuring 10 mm in short axis diameter, unchanged from prior examination though slightly enlarged since remote prior examination of 03/04/2020, nonspecific. Electronically Signed   By: Fidela Salisbury M.D.   On: 03/08/2022 20:02   DG Chest Portable 1 View  Result Date: 03/08/2022 CLINICAL DATA:  Provided history: Chest pain, shortness of breath. History of atrial fibrillation and congestive heart failure. Prior open heart  surgery. EXAM: PORTABLE CHEST 1 VIEW COMPARISON:  Prior chest radiographs 02/12/2022 and earlier. FINDINGS:  Prior median sternotomy. Prior left atrial appendage clipping. Cardiomegaly. No appreciable airspace consolidation or pulmonary edema. No evidence of pleural effusion or pneumothorax. No acute bony abnormality identified. IMPRESSION: No evidence of acute cardiopulmonary abnormality. Cardiomegaly. Electronically Signed   By: Kellie Simmering D.O.   On: 03/08/2022 15:23    EKG: Independently reviewed.  Sinus rhythm with PVCs, QTc 559.  Assessment and Plan  Acute on chronic biventricular systolic CHF Patient is endorsing dyspnea on minimal exertion, orthopnea, bilateral lower extremity edema, and weight gain of 8 pounds over the past 2 weeks.  BNP likely falsely low in the setting of class III obesity.  She does have rales on exam, however, lungs clear on CT.  Recent TEE done 01/20/2022 showing LVEF 01%, RV systolic function severely reduced, all 4 chambers severely dilated, mild mitral stenosis, severe tricuspid valve regurgitation. Recent RHC done 02/22/2022 showing normal PCWP, mild pulmonary venous hypertension, mildly elevated RA pressure, and preserved cardiac output. -Will avoid giving diuretics at this time as patient received IV contrast for CT angiogram in the ED in the setting of CKD stage IV.  Repeat labs to check renal function.  I have sent a message to cardiology requesting consultation in the morning.  Chest pain Appears atypical, ongoing for the past 3 days.  Patient continues to endorse 8 out of 10 intensity chest pain despite receiving GI cocktail, Pepcid, fentanyl, and sublingual nitroglycerin x3.  Chest pain is not reproducible on exam.  She appears very comfortable and in no distress.  ACS less likely as troponin negative x2.  No significant elevation of D-dimer when adjusted for age and not tachycardic or hypoxic. CTA negative for aortic dissection. -Cardiology consulted  Mild  hypokalemia -Repeat labs to check potassium level, replace if still low.  Also check magnesium level.  QT prolongation Patient is on amiodarone and QT interval increased on previous EKGs as well. -Cardiac monitoring -Repeat labs to check potassium level.  Also check magnesium level. -Avoid QT prolonging drugs if possible.  Continue amiodarone after pharmacy med rec is done if okay with cardiology. -Repeat EKG in a.m.  Paroxysmal A-fib Currently in sinus rhythm. -Given QT prolongation, continue amiodarone after pharmacy med rec is done if okay with cardiology.  Continue Eliquis and metoprolol after pharmacy med rec is done.  Insulin-dependent type 2 diabetes A1c 6.7 on 11/21/2021. -Very sensitive sliding scale insulin -Pharmacy med rec pending.  CKD stage IV Renal function was stable on initial labs, however, patient received IV contrast for CTA. -Repeat labs to check renal function and avoid any other nephrotoxic agents at this time.  Carotid artery stenosis CTA showing 50% stenosis of the left cervical internal carotid artery at its origin. -Duplex ultrasound ordered for further evaluation.  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 The more proximal demonstrates a bilobed configuration with thrombosis of 1 of the lobes measuring 1.7 x 2.1 x 2.2 cm the second demonstrating broad communication with the feeding vessel measures 1.3 x 1.6 x 1.4 cm in dimension. -Consult vascular surgery in the morning.  COPD: Stable, not wheezing. Depression/anxiety Hypertension: Currently normotensive. -Pharmacy med rec pending.  Code Status: Full Code (discussed with the patient) Family Communication: No family available at this time. Level of care: Telemetry bed Admission status: It is my clinical opinion that referral for OBSERVATION is reasonable and necessary in this patient based on the above information provided. The  aforementioned taken together are felt to  place the patient at high risk for further clinical deterioration. However, it is anticipated that the patient may be medically stable for discharge from the hospital within 24 to 48 hours.   Shela Leff MD [facility]  If 7PM-7AM, please contact night-coverage [email or web address]  03/09/2022, 4:49 AM

## 2022-03-09 NOTE — Progress Notes (Signed)
Patient seen and examined.  Admitted early morning hours by nighttime hospitalist.  Extensive cardiovascular history presented with progressive shortness of breath and atypical chest pain.  Acute coronary syndrome ruled out.  Ongoing chest pain and acute on chronic systolic heart failure.  Currently remains a stable.  Cardiology following.  Same-day admit.  No charge visit.

## 2022-03-09 NOTE — Progress Notes (Signed)
Carotid artery duplex completed. Refer to "CV Proc" under chart review to view preliminary results.  03/09/2022 11:28 AM Kelby Aline., MHA, RVT, RDCS, RDMS

## 2022-03-09 NOTE — Care Management Obs Status (Signed)
[place] NOTIFICATION   Patient Details  Name: [name] MRN: [id number] Date of Birth: 03/11/1956   Medicare Observation Status Notification Given:  Yes    Bethena Roys, RN 03/09/2022, 4:31 PM

## 2022-03-09 NOTE — Progress Notes (Signed)
Pt arrived from [facility], complaining of 8/10 intermittent chest pain that worsens when breathing. Hal Hope, MD paged & made aware. Awaiting further orders.   Elaina Hoops, RN

## 2022-03-09 NOTE — Consult Note (Addendum)
Cardiology Consultation:   Patient ID: [name] MRN: [id number]; DOB: 07/04/1956  Admit date: 03/08/2022 Date of Consult: 03/09/2022  PCP:  [name], NP   [facility] Providers Cardiologist:  Buford Dresser, MD  Electrophysiologist:  Vickie Epley, MD  [facility]: Dr [name]    Patient Profile:   [name] is a 66 y.o. female with a hx of chronic combined systolic/diastolic congestive heart failure, previous mitral and tricuspid valve repair, atrial fibrillation, COPD, diabetes mellitus, hypertension, hyperlipidemia, obstructive sleep apnea who is being seen 03/09/2022 for the evaluation of acute on chronic combined systolic/diastolic congestive heart failure and chest pain at the request of Barb Merino MD.  History of Present Illness:   Cardiac catheterization May 2019 showed minimal nonobstructive coronary disease.  Status post mitral valve and tricuspid valve repair and maze procedure May 2022.  Transthoracic echocardiogram April 2023 showed ejection fraction 35 to 40%, D-shaped septum consistent with volume/pressure overload, moderate RV dysfunction, severe biatrial enlargement, mild mitral stenosis status post mitral valve repair, previous tricuspid valve repair with severe tricuspid regurgitation.  Most recent transesophageal echocardiogram June 2023 showed ejection fraction 55%, severe right ventricular enlargement, severe RV dysfunction, severe left atrial enlargement, severe right atrial enlargement, status post mitral valve repair with mild mitral stenosis (mean gradient 3 mmHg), status post tricuspid valve repair with severe tricuspid regurgitation.  Right heart catheterization June 2023 showed RA pressure 27, PA pressure 60/27, pulmonary capillary wedge pressure 25 with a V wave of 35.  Follow-up RHC July 2023 showed normal pulmonary capillary wedge pressure.  Patient was subsequently seen by Dr. Burt Knack for consideration of tricuspid replacement using  valve in ring technique.  However after full evaluation she was considered not to be a good candidate.  And has had multiple cardioversions of atrial fibrillation as well.  Patient states that for the past 4 days she has had chest pain.  It is in the left chest and substernal area, back and abdomen.  It is described as a stabbing pressure.  She has had nausea but no diaphoresis.  The pain increases with inspiration and has essentially been continuous but waxing and waning.  She also notes increasing dyspnea on exertion and orthopnea.  She states she has had worsening pedal edema.  She denies fevers, chills or hemoptysis.  Mildly productive cough of whitish sputum.  Cardiology now asked to evaluate.  Note her metolazone was decreased at her most recent hospitalization.  She has been compliant with her medications, low-sodium diet and fluid restriction.   Past Medical History:  Diagnosis Date   Acute on chronic systolic CHF (congestive heart failure) (HCC) 12/21/2020   Allergic rhinitis    Arthritis    Asthma    Chronic diastolic CHF (congestive heart failure) (HCC)    COPD (chronic obstructive pulmonary disease) (HCC)    Depression    DM (diabetes mellitus) ([place])    DVT (deep vein thrombosis) in pregnancy    GERD (gastroesophageal reflux disease)    HTN (hypertension)    Hyperlipidemia    Obesity    Pneumonia 04/2018   RIGHT LOBE   Sleep apnea    compliant with CPAP    Past Surgical History:  Procedure Laterality Date   ABDOMINAL HYSTERECTOMY     BUBBLE STUDY  11/26/2020   Procedure: BUBBLE STUDY;  Surgeon: Buford Dresser, MD;  Location: [place];  Service: Cardiovascular;;   CARDIOVERSION N/A 05/06/2020   Procedure: CARDIOVERSION;  Surgeon: Buford Dresser, MD;  Location: [facility]  ENDOSCOPY;  Service: Cardiovascular;  Laterality: N/A;   CARDIOVERSION N/A 11/04/2020   Procedure: CARDIOVERSION;  Surgeon: Lelon Perla, MD;  Location: [place];  Service:  Cardiovascular;  Laterality: N/A;   CARDIOVERSION N/A 02/01/2021   Procedure: CARDIOVERSION;  Surgeon: Jolaine Artist, MD;  Location: [place];  Service: Cardiovascular;  Laterality: N/A;   CARDIOVERSION N/A 02/09/2021   Procedure: CARDIOVERSION;  Surgeon: Jolaine Artist, MD;  Location: [place];  Service: Cardiovascular;  Laterality: N/A;   CARDIOVERSION N/A 04/19/2021   Procedure: CARDIOVERSION;  Surgeon: Fay Records, MD;  Location: [place];  Service: Cardiovascular;  Laterality: N/A;   CARDIOVERSION N/A 11/25/2021   Procedure: CARDIOVERSION;  Surgeon: Jolaine Artist, MD;  Location: [place];  Service: Cardiovascular;  Laterality: N/A;   CARDIOVERSION N/A 01/31/2022   Procedure: CARDIOVERSION;  Surgeon: Jolaine Artist, MD;  Location: [place];  Service: Cardiovascular;  Laterality: N/A;   CATARACT EXTRACTION, BILATERAL     CHOLECYSTECTOMY     COLONOSCOPY WITH PROPOFOL N/A 03/05/2015   Procedure: COLONOSCOPY WITH PROPOFOL;  Surgeon: Carol Ada, MD;  Location: [facility] ENDOSCOPY;  Service: Endoscopy;  Laterality: N/A;   DIAGNOSTIC LAPAROSCOPY     ingrown hallux Left    KNEE SURGERY     LEFT HEART CATH AND CORONARY ANGIOGRAPHY N/A 12/31/2017   Procedure: LEFT HEART CATH AND CORONARY ANGIOGRAPHY;  Surgeon: Charolette Forward, MD;  Location: [place] [facility];  Service: Cardiovascular;  Laterality: N/A;   MAZE N/A 12/29/2020   Procedure: MAZE;  Surgeon: [name], MD;  Location: [place];  Service: Open Heart Surgery;  Laterality: N/A;   MITRAL VALVE REPAIR N/A 12/29/2020   Procedure: MITRAL VALVE REPAIR USING CARBOMEDICS ANNULOFLEX RING SIZE 30;  Surgeon: [name], MD;  Location: [place];  Service: Open Heart Surgery;  Laterality: N/A;   RIGHT HEART CATH N/A 12/22/2020   Procedure: RIGHT HEART CATH;  Surgeon: Larey Dresser, MD;  Location: [place] [facility];  Service: Cardiovascular;  Laterality: N/A;   RIGHT HEART CATH N/A 01/31/2021    Procedure: RIGHT HEART CATH;  Surgeon: Jolaine Artist, MD;  Location: [place] [facility];  Service: Cardiovascular;  Laterality: N/A;   RIGHT HEART CATH N/A 07/29/2021   Procedure: RIGHT HEART CATH;  Surgeon: Jolaine Artist, MD;  Location: [place] [facility];  Service: Cardiovascular;  Laterality: N/A;   RIGHT HEART CATH N/A 11/24/2021   Procedure: RIGHT HEART CATH;  Surgeon: Jolaine Artist, MD;  Location: [place] [facility];  Service: Cardiovascular;  Laterality: N/A;   RIGHT HEART CATH N/A 02/14/2022   Procedure: RIGHT HEART CATH;  Surgeon: Jolaine Artist, MD;  Location: [place] [facility];  Service: Cardiovascular;  Laterality: N/A;   RIGHT HEART CATH N/A 02/22/2022   Procedure: RIGHT HEART CATH;  Surgeon: Larey Dresser, MD;  Location: [place] [facility];  Service: Cardiovascular;  Laterality: N/A;   TEE WITHOUT CARDIOVERSION N/A 11/26/2020   Procedure: TRANSESOPHAGEAL ECHOCARDIOGRAM (TEE);  Surgeon: Buford Dresser, MD;  Location: [facility] ENDOSCOPY;  Service: Cardiovascular;  Laterality: N/A;   TEE WITHOUT CARDIOVERSION N/A 12/29/2020   Procedure: TRANSESOPHAGEAL ECHOCARDIOGRAM (TEE);  Surgeon: [name], MD;  Location: [place];  Service: Open Heart Surgery;  Laterality: N/A;   TEE WITHOUT CARDIOVERSION N/A 01/20/2022   Procedure: TRANSESOPHAGEAL ECHOCARDIOGRAM (TEE);  Surgeon: Jolaine Artist, MD;  Location: [facility] ENDOSCOPY;  Service: Cardiovascular;  Laterality: N/A;   TRICUSPID VALVE REPLACEMENT N/A 12/29/2020  Procedure: 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 RING SIZE 34;  Surgeon: [name], MD;  Location: [place];  Service: Open Heart Surgery;  Laterality: N/A;     Inpatient Medications: Scheduled Meds:  insulin aspart  0-5 Units Subcutaneous QHS   insulin aspart  0-6 Units Subcutaneous TID WC   Continuous Infusions:  PRN Meds: acetaminophen **OR** acetaminophen  Allergies:    Allergies  Allergen Reactions   Bee Pollen  Anaphylaxis   Sulfa Antibiotics Itching    Social History:   Social History   Socioeconomic History   Marital status: Significant Other    Spouse name: Not on file   Number of children: 2   Years of education: Not on file   Highest education level: Some college, no degree  Occupational History   Not on file  Tobacco Use   Smoking status: Never   Smokeless tobacco: Never  Vaping Use   Vaping Use: Never used  Substance and Sexual Activity   Alcohol use: No   Drug use: No   Sexual activity: Not Currently  Other Topics Concern   Not on file  Social History Narrative   Pt lives in [place] with spouse.  2 grown children.   Previously worked in [employer] at [employer].  Now on disability   Social Determinants of Health   Financial Resource Strain: Low Risk  (01/19/2022)   Overall Financial Resource Strain (CARDIA)    Difficulty of Paying Living Expenses: Not hard at all  Food Insecurity: No Food Insecurity (01/19/2022)   Hunger Vital Sign    Worried About Running Out of Food in the Last Year: Never true    Ran Out of Food in the Last Year: Never true  Transportation Needs: No Transportation Needs (01/19/2022)   PRAPARE - Hydrologist (Medical): No    Lack of Transportation (Non-Medical): No  Physical Activity: Insufficiently Active (10/28/2021)   Exercise Vital Sign    Days of Exercise per Week: 2 days    Minutes of Exercise per Session: 70 min  Stress: No Stress Concern Present (10/28/2021)   Needles    Feeling of Stress : Not at all  Social Connections: Socially Isolated (10/28/2021)   Social Connection and Isolation Panel [NHANES]    Frequency of Communication with Friends and Family: More than three times a week    Frequency of Social Gatherings with Friends and Family: Three times a week    Attends Religious Services: Never    Active Member of Clubs or Organizations: No     Attends Archivist Meetings: Never    Marital Status: Widowed  Intimate Partner Violence: Not At Risk (10/28/2021)   Humiliation, Afraid, Rape, and Kick questionnaire    Fear of Current or Ex-Partner: No    Emotionally Abused: No    Physically Abused: No    Sexually Abused: No    Family History:    Family History  Problem Relation Age of Onset   Breast cancer Mother    Cancer Mother    Hypertension Mother    Cancer Father    Hypertension Sister    Hypertension Brother    CAD Other      ROS:  Please see the history of present illness.  Cough productive of whitish sputum. All other ROS reviewed and negative.     Physical Exam/Data:   Vitals:   03/08/22 2246 03/09/22 4782 03/09/22 9562  03/09/22 1202  BP: (!) 154/96 118/66 104/65 (!) 112/57  Pulse: 70 71 75 78  Resp: '16  18 18  '$ Temp: 97.9 F (36.6 C) 97.7 F (36.5 C) 97.7 F (36.5 C) 97.9 F (36.6 C)  TempSrc: Axillary Oral Oral Oral  SpO2: 100% 99% 100%   Weight: 124.8 kg     Height: '5\' 7"'$  (1.702 m)       Intake/Output Summary (Last 24 hours) at 03/09/2022 1216 Last data filed at 03/09/2022 1205 Gross per 24 hour  Intake 642.05 ml  Output 550 ml  Net 92.05 ml      03/08/2022   10:46 PM 03/08/2022    2:45 PM 02/24/2022    5:51 AM  Last 3 Weights  Weight (lbs) 275 lb 1.6 oz 267 lb 13.7 oz 267 lb 14.4 oz  Weight (kg) 124.785 kg 121.5 kg 121.519 kg     Body mass index is 43.09 kg/m.  General:  Well nourished, obese, in no acute distress HEENT: normal Neck: positive JVD Vascular: No carotid bruits; Distal pulses 2+ bilaterally Cardiac:  normal S1, S2; RRR; 2/6 sytolic murmur Lungs:  clear to auscultation bilaterally, no wheezing, rhonchi or rales  Abd: soft, nontender, no hepatomegaly  Ext: trace to 1+ edema; difficult due to fatty tissue Musculoskeletal:  No deformities, BUE and BLE strength normal and equal Skin: warm and dry  Neuro:  CNs 2-12 intact, no focal abnormalities noted Psych:  Normal  affect   EKG:  The EKG was personally reviewed and demonstrates: Normal sinus rhythm with occasional PVC, first-degree AV block, RV conduction delay, inferior and anterior infarct, prolonged QT interval.  Personally reviewed Telemetry:  Telemetry was personally reviewed and demonstrates:  Sinus with PVCs   Laboratory Data:  High Sensitivity Troponin:   Recent Labs  Lab 02/12/22 1226 02/12/22 1457 03/08/22 1503 03/08/22 1712  TROPONINIHS '7 6 5 5     '$ Chemistry Recent Labs  Lab 03/08/22 1503 03/09/22 0558  NA 143 141  K 3.2* 2.8*  CL 104 103  CO2 29 29  GLUCOSE 84 96  BUN 37* 34*  CREATININE 2.46* 2.46*  CALCIUM 10.1 9.0  MG  --  1.8  GFRNONAA 21* 21*  ANIONGAP 10 9     Hematology Recent Labs  Lab 03/08/22 1503  WBC 4.5  RBC 4.12  HGB 10.7*  HCT 34.6*  MCV 84.0  MCH 26.0  MCHC 30.9  RDW 18.0*  PLT 184    BNP Recent Labs  Lab 03/08/22 1506  BNP 205.3*    DDimer  Recent Labs  Lab 03/08/22 1501  DDIMER 0.70*     Radiology/Studies:  VAS US CAROTID  Result Date: 03/09/2022 Carotid Arterial Duplex Study Patient Name:  SAYDA GRABLE  Date of Exam:   03/09/2022 Medical Rec #: [id number]       Accession #:    [id number] Date of Birth: [birth year]       Patient Gender: F Patient Age:   44 years Exam Location:  [facility] Procedure:      VAS US CAROTID Referring Phys: Wandra Feinstein RATHORE --------------------------------------------------------------------------------  Risk Factors:      Hypertension, hyperlipidemia, Diabetes. Comparison Study:  12/24/2020 1-39% bilateral ICA stenosis Performing Technologist: Maudry Mayhew MHA, RDMS, RVT, RDCS  Examination Guidelines: A complete evaluation includes B-mode imaging, spectral Doppler, color Doppler, and power Doppler as needed of all accessible portions of each vessel. Bilateral testing is considered an integral part of a complete examination. Limited examinations for reoccurring  indications may be performed as  noted.  Right Carotid Findings: +----------+--------+--------+--------+--------------------------+--------+           PSV cm/sEDV cm/sStenosisPlaque Description        Comments +----------+--------+--------+--------+--------------------------+--------+ CCA Prox  120     19                                                 +----------+--------+--------+--------+--------------------------+--------+ CCA Distal84      16                                                 +----------+--------+--------+--------+--------------------------+--------+ ICA Prox  65      19              irregular and heterogenous         +----------+--------+--------+--------+--------------------------+--------+ ICA Distal78      29                                                 +----------+--------+--------+--------+--------------------------+--------+ ECA       87                                                         +----------+--------+--------+--------+--------------------------+--------+ +----------+--------+-------+----------------+-------------------+           PSV cm/sEDV cmsDescribe        Arm Pressure (mmHG) +----------+--------+-------+----------------+-------------------+ [id number]            Multiphasic, WNL                    +----------+--------+-------+----------------+-------------------+ +---------+--------+--+--------+--+---------+ VertebralPSV cm/s68EDV cm/s21Antegrade +---------+--------+--+--------+--+---------+  Left Carotid Findings: +----------+--------+--------+--------+-------------------------------+--------+           PSV cm/sEDV cm/sStenosisPlaque Description             Comments +----------+--------+--------+--------+-------------------------------+--------+ CCA Prox  109     18                                                      +----------+--------+--------+--------+-------------------------------+--------+ CCA Distal89      25                                                       +----------+--------+--------+--------+-------------------------------+--------+ ICA Prox  73      22              heterogenous, focal and  calcific                                +----------+--------+--------+--------+-------------------------------+--------+ ICA Distal76      22                                                      +----------+--------+--------+--------+-------------------------------+--------+ ECA       100     10                                                      +----------+--------+--------+--------+-------------------------------+--------+ +----------+--------+--------+----------------+-------------------+           PSV cm/sEDV cm/sDescribe        Arm Pressure (mmHG) +----------+--------+--------+----------------+-------------------+ [id number]             Multiphasic, WNL                    +----------+--------+--------+----------------+-------------------+ +---------+--------+--+--------+--+---------+ VertebralPSV cm/s78EDV cm/s24Antegrade +---------+--------+--+--------+--+---------+   Summary: Right Carotid: Velocities in the right ICA are consistent with a 1-39% stenosis. Left Carotid: Velocities in the left ICA are consistent with a 1-39% stenosis. Vertebrals:  Bilateral vertebral arteries demonstrate antegrade flow. Subclavians: Normal flow hemodynamics were seen in bilateral subclavian              arteries. *See table(s) above for measurements and observations.     Preliminary    CT Angio Chest/Abd/Pel for Dissection W and/or Wo Contrast  Result Date: 03/08/2022 CLINICAL DATA:  Acute aortic syndrome (AAS) EXAM: CT ANGIOGRAPHY CHEST, ABDOMEN AND PELVIS TECHNIQUE: Non-contrast CT of the chest was initially obtained. Multidetector CT imaging through the chest, abdomen and pelvis was performed using the standard protocol during bolus  administration of intravenous contrast. Multiplanar reconstructed images and MIPs were obtained and reviewed to evaluate the vascular anatomy. RADIATION DOSE REDUCTION: This exam was performed according to the departmental dose-optimization program which includes automated exposure control, adjustment of the mA and/or kV according to patient size and/or use of iterative reconstruction technique. CONTRAST:  123m OMNIPAQUE IOHEXOL 350 MG/ML SOLN COMPARISON:  CT chest 11/21/2021, CT abdomen pelvis 09/23/2021 FINDINGS: CTA CHEST FINDINGS Cardiovascular: Mitral and tricuspid valve replacement has been performed. There is stable global cardiomegaly. No pericardial effusion. Left atrial clipping has been performed central pulmonary arteries are enlarged in keeping with changes of pulmonary arterial hypertension, stable since prior examination. The thoracic aorta is normal in course and caliber there is no evidence of intramural hematoma, aneurysm, or dissection. Bovine arch anatomy with wide patency of the arch vasculature proximally. 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 Mediastinum/Nodes: No enlarged mediastinal, hilar, or axillary lymph nodes. Thyroid gland, trachea, and esophagus demonstrate no significant findings. Lungs/Pleura: Lungs are clear. No pleural effusion or pneumothorax. Musculoskeletal: No acute bone abnormality. No lytic or blastic bone lesion. Review of the MIP images confirms the above findings. CTA ABDOMEN AND PELVIS FINDINGS VASCULAR Aorta: Mild atherosclerotic calcification within the abdominal aorta. No aortic aneurysm or dissection. No periaortic inflammatory change. Celiac: Patent without evidence of aneurysm, dissection, vasculitis or significant stenosis.  SMA: Patent without evidence of aneurysm, dissection, vasculitis or significant stenosis. Renals: Single renal arteries bilaterally. No evidence of  hemodynamically significant stenosis. Normal vascular morphology. 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 IMA: Patent without evidence of aneurysm, dissection, vasculitis or significant stenosis. Inflow: Patent without evidence of aneurysm, dissection, vasculitis or significant stenosis. Veins: No obvious venous abnormality within the limitations of this arterial phase study. Review of the MIP images confirms the above findings. NON-VASCULAR Hepatobiliary: No focal liver abnormality is seen. Status post cholecystectomy. No biliary dilatation. Pancreas: Unremarkable Spleen: Unremarkable Adrenals/Urinary Tract: Adrenal glands are unremarkable. Kidneys are normal, without renal calculi, focal lesion, or hydronephrosis. Bladder is unremarkable. Stomach/Bowel: Stomach is within normal limits. Appendix appears normal. No evidence of bowel wall thickening, distention, or inflammatory changes. Lymphatic: A shotty gastrohepatic lymph node is identified at axial image # 141/5 measuring 10 mm in short axis diameter, unchanged from prior examination though slightly enlarged since remote prior examination of 03/04/2020, nonspecific. No additional pathologic adenopathy is seen within the abdomen and pelvis. Reproductive: Status post hysterectomy. No adnexal masses. Other: No abdominal wall hernia Musculoskeletal: No acute bone abnormality. No lytic or blastic bone lesion. Review of the MIP images confirms the above findings. IMPRESSION: 1. No evidence of thoracoabdominal aortic aneurysm or dissection. 2. 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 This is incompletely evaluated on this examination. Correlation with carotid artery  duplex sonography or CT arteriography may be helpful for further evaluation. 3. 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 Stable global cardiomegaly. 4. Stable enlargement of the central pulmonary arteries in keeping with changes of pulmonary arterial hypertension. 5. 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 The more proximal demonstrates a bilobed configuration with thrombosis of 1 of the lobes measuring 1.7 x 2.1 x 2.2 cm the second demonstrating broad communication with the feeding vessel measures 1.3 x 1.6 x 1.4 cm in dimension. 6. Shotty gastrohepatic lymph node measuring 10 mm in short axis diameter, unchanged from prior examination though slightly enlarged since remote prior examination of 03/04/2020, nonspecific. Electronically Signed   By: Fidela Salisbury M.D.   On: 03/08/2022 20:02   DG Chest Portable 1 View  Result Date: 03/08/2022 CLINICAL DATA:  Provided history: Chest pain, shortness of breath. History of atrial fibrillation and congestive heart failure. Prior open heart surgery. EXAM: PORTABLE CHEST 1 VIEW COMPARISON:  Prior chest radiographs 02/12/2022 and earlier. FINDINGS: Prior median sternotomy. Prior left atrial appendage clipping. Cardiomegaly. No appreciable airspace consolidation or pulmonary edema. No evidence of pleural effusion or pneumothorax. No acute bony abnormality identified. IMPRESSION: No evidence of acute cardiopulmonary abnormality. Cardiomegaly. Electronically Signed   By: Kellie Simmering D.O.   On: 03/08/2022 15:23     Assessment and Plan:   Chest pain-symptoms are very atypical.  Troponins are normal.  Electrocardiogram shows no ST changes.  Last cardiac catheterization showed minimal coronary disease.  Would not pursue further ischemia evaluation.  Note she had a CTA in the emergency room to rule out dissection which was negative. Acute on chronic combined systolic/diastolic congestive heart  failure-patient feels similar to previous presentations with congestive heart failure.  Physical  exam is somewhat difficult.  BNP 205.  Would diurese with Lasix 120 mg IV twice daily.  Add metolazone 2.5 mg today.  Follow renal function closely.  Note patient is not on SGLT2 inhibitor as her GFR has been less than 20%. Chronic stage V kidney disease-follow renal function closely with diuresis.  It should also be noted she had a CTA in the emergency room to rule out dissection and therefore is at risk for contrast nephropathy. Paroxysmal atrial fibrillation-she is in sinus rhythm.  Continue amiodarone and apixaban at preadmission doses. History of mitral valve repair/tricuspid valve repair-she has residual severe tricuspid regurgitation.  However she has been evaluated by Dr. Burt Knack and felt not to be a candidate for tricuspid valve replacement with valve and ring technique. History of cardiomyopathy-LV function has improved on most recent transesophageal echocardiogram (personally reviewed).  Continue beta-blocker at present dose. Hypertension-patient's blood pressure is controlled.  Continue present medical regimen. Hyperlipidemia-continue statin. Coronary artery disease-minimal on previous catheterization.  Continue statin. Obstructive sleep apnea-continue CPAP. Prolonged QT interval-likely from combination of amiodarone and hypokalemia.  Continue amiodarone and supplement potassium.  Follow.   Risk Assessment/Risk Scores:  [id number]}  New York Heart Association (NYHA) Functional Class NYHA Class III  CHA2DS2-VASc Score = 5      For questions or updates, please contact [place] Please consult [email or web address] for contact info under    Signed, Kirk Ruths, MD  03/09/2022 12:16 PM

## 2022-03-10 DIAGNOSIS — Z20822 Contact with and (suspected) exposure to covid-19: Secondary | ICD-10-CM | POA: Diagnosis present

## 2022-03-10 DIAGNOSIS — Z992 Dependence on renal dialysis: Secondary | ICD-10-CM | POA: Diagnosis not present

## 2022-03-10 DIAGNOSIS — E1122 Type 2 diabetes mellitus with diabetic chronic kidney disease: Secondary | ICD-10-CM | POA: Diagnosis present

## 2022-03-10 DIAGNOSIS — I272 Pulmonary hypertension, unspecified: Secondary | ICD-10-CM | POA: Diagnosis present

## 2022-03-10 DIAGNOSIS — I10 Essential (primary) hypertension: Secondary | ICD-10-CM | POA: Diagnosis not present

## 2022-03-10 DIAGNOSIS — N184 Chronic kidney disease, stage 4 (severe): Secondary | ICD-10-CM | POA: Diagnosis not present

## 2022-03-10 DIAGNOSIS — Z881 Allergy status to other antibiotic agents status: Secondary | ICD-10-CM | POA: Diagnosis not present

## 2022-03-10 DIAGNOSIS — M898X9 Other specified disorders of bone, unspecified site: Secondary | ICD-10-CM | POA: Diagnosis present

## 2022-03-10 DIAGNOSIS — I5082 Biventricular heart failure: Secondary | ICD-10-CM | POA: Diagnosis present

## 2022-03-10 DIAGNOSIS — I132 Hypertensive heart and chronic kidney disease with heart failure and with stage 5 chronic kidney disease, or end stage renal disease: Secondary | ICD-10-CM | POA: Diagnosis present

## 2022-03-10 DIAGNOSIS — N179 Acute kidney failure, unspecified: Secondary | ICD-10-CM | POA: Diagnosis present

## 2022-03-10 DIAGNOSIS — D696 Thrombocytopenia, unspecified: Secondary | ICD-10-CM | POA: Diagnosis present

## 2022-03-10 DIAGNOSIS — E662 Morbid (severe) obesity with alveolar hypoventilation: Secondary | ICD-10-CM | POA: Diagnosis present

## 2022-03-10 DIAGNOSIS — I2510 Atherosclerotic heart disease of native coronary artery without angina pectoris: Secondary | ICD-10-CM | POA: Diagnosis present

## 2022-03-10 DIAGNOSIS — I5043 Acute on chronic combined systolic (congestive) and diastolic (congestive) heart failure: Secondary | ICD-10-CM | POA: Diagnosis present

## 2022-03-10 DIAGNOSIS — Z952 Presence of prosthetic heart valve: Secondary | ICD-10-CM | POA: Diagnosis not present

## 2022-03-10 DIAGNOSIS — I6522 Occlusion and stenosis of left carotid artery: Secondary | ICD-10-CM | POA: Diagnosis present

## 2022-03-10 DIAGNOSIS — I2721 Secondary pulmonary arterial hypertension: Secondary | ICD-10-CM | POA: Diagnosis present

## 2022-03-10 DIAGNOSIS — R079 Chest pain, unspecified: Secondary | ICD-10-CM | POA: Diagnosis not present

## 2022-03-10 DIAGNOSIS — Z6841 Body Mass Index (BMI) 40.0 and over, adult: Secondary | ICD-10-CM | POA: Diagnosis not present

## 2022-03-10 DIAGNOSIS — I480 Paroxysmal atrial fibrillation: Secondary | ICD-10-CM | POA: Diagnosis present

## 2022-03-10 DIAGNOSIS — Z91030 Bee allergy status: Secondary | ICD-10-CM | POA: Diagnosis not present

## 2022-03-10 DIAGNOSIS — N186 End stage renal disease: Secondary | ICD-10-CM | POA: Diagnosis present

## 2022-03-10 DIAGNOSIS — J449 Chronic obstructive pulmonary disease, unspecified: Secondary | ICD-10-CM | POA: Diagnosis present

## 2022-03-10 DIAGNOSIS — I509 Heart failure, unspecified: Secondary | ICD-10-CM | POA: Diagnosis not present

## 2022-03-10 DIAGNOSIS — F32A Depression, unspecified: Secondary | ICD-10-CM | POA: Diagnosis present

## 2022-03-10 DIAGNOSIS — E876 Hypokalemia: Secondary | ICD-10-CM | POA: Diagnosis present

## 2022-03-10 DIAGNOSIS — D631 Anemia in chronic kidney disease: Secondary | ICD-10-CM | POA: Diagnosis present

## 2022-03-10 DIAGNOSIS — I722 Aneurysm of renal artery: Secondary | ICD-10-CM | POA: Diagnosis present

## 2022-03-10 DIAGNOSIS — I130 Hypertensive heart and chronic kidney disease with heart failure and stage 1 through stage 4 chronic kidney disease, or unspecified chronic kidney disease: Secondary | ICD-10-CM | POA: Diagnosis not present

## 2022-03-10 DIAGNOSIS — I440 Atrioventricular block, first degree: Secondary | ICD-10-CM | POA: Diagnosis present

## 2022-03-10 DIAGNOSIS — N189 Chronic kidney disease, unspecified: Secondary | ICD-10-CM | POA: Diagnosis not present

## 2022-03-10 DIAGNOSIS — N185 Chronic kidney disease, stage 5: Secondary | ICD-10-CM | POA: Diagnosis not present

## 2022-03-10 LAB — GLUCOSE, CAPILLARY
Glucose-Capillary: 107 mg/dL — ABNORMAL HIGH (ref 70–99)
Glucose-Capillary: 115 mg/dL — ABNORMAL HIGH (ref 70–99)
Glucose-Capillary: 99 mg/dL (ref 70–99)
Glucose-Capillary: 113 mg/dL — ABNORMAL HIGH (ref 70–99)

## 2022-03-10 LAB — BASIC METABOLIC PANEL
Anion gap: 11 (ref 5–15)
BUN: 36 mg/dL — ABNORMAL HIGH (ref 8–23)
CO2: 25 mmol/L (ref 22–32)
Calcium: 8.9 mg/dL (ref 8.9–10.3)
Chloride: 101 mmol/L (ref 98–111)
Creatinine, Ser: 3.12 mg/dL — ABNORMAL HIGH (ref 0.44–1.00)
GFR, Estimated: 16 mL/min — ABNORMAL LOW (ref >60)
Glucose, Bld: 103 mg/dL — ABNORMAL HIGH (ref 70–99)
Potassium: 3.2 mmol/L — ABNORMAL LOW (ref 3.5–5.1)
Sodium: 137 mmol/L (ref 135–145)

## 2022-03-10 LAB — MAGNESIUM: Magnesium: 1.8 mg/dL (ref 1.7–2.4)

## 2022-03-10 MED ORDER — FENTANYL CITRATE PF 50 MCG/ML IJ SOSY
25.0000 ug | PREFILLED_SYRINGE | INTRAMUSCULAR | Status: DC | PRN
  Administered 2022-03-10 – 2022-03-13 (×5): 25 ug via INTRAVENOUS
  Filled 2022-03-10 (×5): qty 1

## 2022-03-10 MED ORDER — NITROGLYCERIN 0.4 MG SL SUBL: 0.4000 mg | SUBLINGUAL_TABLET | SUBLINGUAL | Status: DC | PRN

## 2022-03-10 MED ORDER — NITROGLYCERIN 0.4 MG SL SUBL
SUBLINGUAL_TABLET | SUBLINGUAL | Status: AC
  Administered 2022-03-10: 0.4 mg
  Filled 2022-03-10: qty 1

## 2022-03-10 MED ORDER — FAMOTIDINE IN NACL 20-0.9 MG/50ML-% IV SOLN
20.0000 mg | Freq: Two times a day (BID) | INTRAVENOUS | Status: DC
  Administered 2022-03-10 – 2022-03-11 (×4): 20 mg via INTRAVENOUS
  Filled 2022-03-10 (×5): qty 50

## 2022-03-10 MED ORDER — ONDANSETRON HCL 4 MG/2ML IJ SOLN
4.0000 mg | Freq: Four times a day (QID) | INTRAMUSCULAR | Status: DC | PRN
  Administered 2022-03-10: 4 mg via INTRAVENOUS
  Filled 2022-03-10: qty 2

## 2022-03-10 MED ORDER — METOLAZONE 5 MG PO TABS
2.5000 mg | ORAL_TABLET | Freq: Once | ORAL | Status: AC
  Administered 2022-03-10: 2.5 mg via ORAL
  Filled 2022-03-10: qty 1

## 2022-03-10 MED ORDER — FENTANYL CITRATE PF 50 MCG/ML IJ SOSY
12.5000 ug | PREFILLED_SYRINGE | INTRAMUSCULAR | Status: DC | PRN
  Administered 2022-03-10 (×2): 12.5 ug via INTRAVENOUS
  Filled 2022-03-10 (×2): qty 1

## 2022-03-10 MED ORDER — POTASSIUM CHLORIDE CRYS ER 20 MEQ PO TBCR
40.0000 meq | EXTENDED_RELEASE_TABLET | Freq: Two times a day (BID) | ORAL | Status: AC
  Administered 2022-03-10 – 2022-03-11 (×4): 40 meq via ORAL
  Filled 2022-03-10 (×4): qty 2

## 2022-03-10 NOTE — Progress Notes (Signed)
Progress Note  Patient Name: [name] Date of Encounter: 03/10/2022  [facility] Cardiologist: Buford Dresser, MD   Subjective   Came in with chest pain.  Heart failure.  Has been experiencing nausea, had some vomiting as well.  Feeling a little bit better this morning.  She does not think that her Lasix worked quite as well as it did previously.  Inpatient Medications    Scheduled Meds:  amiodarone  200 mg Oral Daily   apixaban  5 mg Oral BID   insulin aspart  0-5 Units Subcutaneous QHS   insulin aspart  0-6 Units Subcutaneous TID WC   metoprolol succinate  25 mg Oral BID   rosuvastatin  10 mg Oral Daily   Continuous Infusions:  famotidine (PEPCID) IV 20 mg (03/10/22 0953)   furosemide Stopped (03/10/22 0812)   PRN Meds: acetaminophen **OR** acetaminophen, fentaNYL (SUBLIMAZE) injection, nitroGLYCERIN, ondansetron (ZOFRAN) IV   Vital Signs    Vitals:   03/09/22 2139 03/10/22 0528 03/10/22 0949 03/10/22 0953  BP: 131/72 123/65 99/64 (!) 119/57  Pulse: 83 79 79 77  Resp:  17 18   Temp:  98.6 F (37 C) 98.3 F (36.8 C)   TempSrc:  Oral Oral   SpO2: 99% 98% 97%   Weight:  124.1 kg    Height:        Intake/Output Summary (Last 24 hours) at 03/10/2022 1007 Last data filed at 03/10/2022 0543 Gross per 24 hour  Intake 600 ml  Output 675 ml  Net -75 ml      03/10/2022    5:28 AM 03/08/2022   10:46 PM 03/08/2022    2:45 PM  Last 3 Weights  Weight (lbs) 273 lb 11.2 oz 275 lb 1.6 oz 267 lb 13.7 oz  Weight (kg) 124.15 kg 124.785 kg 121.5 kg      Telemetry    SR, PVC - Personally Reviewed  ECG    Sinus rhythm first-degree AV block, PVCs- Personally Reviewed  Physical Exam   GEN: No acute distress.   Neck: No JVD Cardiac: RRR occasional ectopy, 2/6 systolic murmur, no rubs, or gallops.  Respiratory: Clear to auscultation bilaterally. GI: Soft, nontender, non-distended  MS: 2+ lower extremity edema; No deformity. Neuro:  Nonfocal  Psych:  Normal affect   Labs    High Sensitivity Troponin:   Recent Labs  Lab 02/12/22 1226 02/12/22 1457 03/08/22 1503 03/08/22 1712  TROPONINIHS '7 6 5 5     '$ Chemistry Recent Labs  Lab 03/09/22 0558 03/09/22 1422 03/10/22 0233  NA 141 140 137  K 2.8* 3.3* 3.2*  CL 103 99 101  CO2 '29 29 25  '$ GLUCOSE 96 131* 103*  BUN 34* 32* 36*  CREATININE 2.46* 2.83* 3.12*  CALCIUM 9.0 9.2 8.9  MG 1.8  --  1.8  GFRNONAA 21* 18* 16*  ANIONGAP '9 12 11    '$ Lipids No results for input(s): "CHOL", "TRIG", "HDL", "LABVLDL", "LDLCALC", "CHOLHDL" in the last 168 hours.  Hematology Recent Labs  Lab 03/08/22 1503  WBC 4.5  RBC 4.12  HGB 10.7*  HCT 34.6*  MCV 84.0  MCH 26.0  MCHC 30.9  RDW 18.0*  PLT 184   Thyroid No results for input(s): "TSH", "FREET4" in the last 168 hours.  BNP Recent Labs  Lab 03/08/22 1506  BNP 205.3*    DDimer  Recent Labs  Lab 03/08/22 1501  DDIMER 0.70*     Radiology    VAS US CAROTID  Result  Date: 03/09/2022 Carotid Arterial Duplex Study Patient Name:  BREUNA LOVEALL  Date of Exam:   03/09/2022 Medical Rec #: [id number]       Accession #:    [id number] Date of Birth: Jan 20, 1956       Patient Gender: F Patient Age:   66 years Exam Location:  [facility] Procedure:      VAS US CAROTID Referring Phys: Wandra Feinstein RATHORE --------------------------------------------------------------------------------  Risk Factors:      Hypertension, hyperlipidemia, Diabetes. Comparison Study:  12/24/2020 1-39% bilateral ICA stenosis Performing Technologist: Maudry Mayhew MHA, RDMS, RVT, RDCS  Examination Guidelines: A complete evaluation includes B-mode imaging, spectral Doppler, color Doppler, and power Doppler as needed of all accessible portions of each vessel. Bilateral testing is considered an integral part of a complete examination. Limited examinations for reoccurring indications may be performed as noted.  Right Carotid Findings:  +----------+--------+--------+--------+--------------------------+--------+           PSV cm/sEDV cm/sStenosisPlaque Description        Comments +----------+--------+--------+--------+--------------------------+--------+ CCA Prox  120     19                                                 +----------+--------+--------+--------+--------------------------+--------+ CCA Distal84      16                                                 +----------+--------+--------+--------+--------------------------+--------+ ICA Prox  65      19              irregular and heterogenous         +----------+--------+--------+--------+--------------------------+--------+ ICA Distal78      29                                                 +----------+--------+--------+--------+--------------------------+--------+ ECA       87                                                         +----------+--------+--------+--------+--------------------------+--------+ +----------+--------+-------+----------------+-------------------+           PSV cm/sEDV cmsDescribe        Arm Pressure (mmHG) +----------+--------+-------+----------------+-------------------+ [id number]            Multiphasic, WNL                    +----------+--------+-------+----------------+-------------------+ +---------+--------+--+--------+--+---------+ VertebralPSV cm/s68EDV cm/s21Antegrade +---------+--------+--+--------+--+---------+  Left Carotid Findings: +----------+--------+--------+--------+-------------------------------+--------+           PSV cm/sEDV cm/sStenosisPlaque Description             Comments +----------+--------+--------+--------+-------------------------------+--------+ CCA Prox  109     18                                                      +----------+--------+--------+--------+-------------------------------+--------+  CCA Distal89      25                                                       +----------+--------+--------+--------+-------------------------------+--------+ ICA Prox  73      22              heterogenous, focal and                                                   calcific                                +----------+--------+--------+--------+-------------------------------+--------+ ICA Distal76      22                                                      +----------+--------+--------+--------+-------------------------------+--------+ ECA       100     10                                                      +----------+--------+--------+--------+-------------------------------+--------+ +----------+--------+--------+----------------+-------------------+           PSV cm/sEDV cm/sDescribe        Arm Pressure (mmHG) +----------+--------+--------+----------------+-------------------+ [id number]             Multiphasic, WNL                    +----------+--------+--------+----------------+-------------------+ +---------+--------+--+--------+--+---------+ VertebralPSV cm/s78EDV cm/s24Antegrade +---------+--------+--+--------+--+---------+   Summary: Right Carotid: Velocities in the right ICA are consistent with a 1-39% stenosis. Left Carotid: Velocities in the left ICA are consistent with a 1-39% stenosis. Vertebrals:  Bilateral vertebral arteries demonstrate antegrade flow. Subclavians: Normal flow hemodynamics were seen in bilateral subclavian              arteries. *See table(s) above for measurements and observations.  Electronically signed by Jamelle Haring on 03/09/2022 at 8:21:24 PM.    Final    CT Angio Chest/Abd/Pel for Dissection W and/or Wo Contrast  Result Date: 03/08/2022 CLINICAL DATA:  Acute aortic syndrome (AAS) EXAM: CT ANGIOGRAPHY CHEST, ABDOMEN AND PELVIS TECHNIQUE: Non-contrast CT of the chest was initially obtained. Multidetector CT imaging through the chest, abdomen and pelvis was performed using the  standard protocol during bolus administration of intravenous contrast. Multiplanar reconstructed images and MIPs were obtained and reviewed to evaluate the vascular anatomy. RADIATION DOSE REDUCTION: This exam was performed according to the departmental dose-optimization program which includes automated exposure control, adjustment of the mA and/or kV according to patient size and/or use of iterative reconstruction technique. CONTRAST:  127m OMNIPAQUE IOHEXOL 350 MG/ML SOLN COMPARISON:  CT chest 11/21/2021, CT abdomen pelvis 09/23/2021 FINDINGS: CTA CHEST FINDINGS Cardiovascular: Mitral and tricuspid valve replacement has been performed. There is stable global cardiomegaly. No pericardial effusion. Left atrial clipping has been  performed central pulmonary arteries are enlarged in keeping with changes of pulmonary arterial hypertension, stable since prior examination. The thoracic aorta is normal in course and caliber there is no evidence of intramural hematoma, aneurysm, or dissection. Bovine arch anatomy with wide patency of the arch vasculature proximally. 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 Mediastinum/Nodes: No enlarged mediastinal, hilar, or axillary lymph nodes. Thyroid gland, trachea, and esophagus demonstrate no significant findings. Lungs/Pleura: Lungs are clear. No pleural effusion or pneumothorax. Musculoskeletal: No acute bone abnormality. No lytic or blastic bone lesion. Review of the MIP images confirms the above findings. CTA ABDOMEN AND PELVIS FINDINGS VASCULAR Aorta: Mild atherosclerotic calcification within the abdominal aorta. No aortic aneurysm or dissection. No periaortic inflammatory change. Celiac: Patent without evidence of aneurysm, dissection, vasculitis or significant stenosis. SMA: Patent without evidence of aneurysm, dissection, vasculitis or significant stenosis. Renals: Single renal arteries  bilaterally. No evidence of hemodynamically significant stenosis. Normal vascular morphology. 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 IMA: Patent without evidence of aneurysm, dissection, vasculitis or significant stenosis. Inflow: Patent without evidence of aneurysm, dissection, vasculitis or significant stenosis. Veins: No obvious venous abnormality within the limitations of this arterial phase study. Review of the MIP images confirms the above findings. NON-VASCULAR Hepatobiliary: No focal liver abnormality is seen. Status post cholecystectomy. No biliary dilatation. Pancreas: Unremarkable Spleen: Unremarkable Adrenals/Urinary Tract: Adrenal glands are unremarkable. Kidneys are normal, without renal calculi, focal lesion, or hydronephrosis. Bladder is unremarkable. Stomach/Bowel: Stomach is within normal limits. Appendix appears normal. No evidence of bowel wall thickening, distention, or inflammatory changes. Lymphatic: A shotty gastrohepatic lymph node is identified at axial image # 141/5 measuring 10 mm in short axis diameter, unchanged from prior examination though slightly enlarged since remote prior examination of 03/04/2020, nonspecific. No additional pathologic adenopathy is seen within the abdomen and pelvis. Reproductive: Status post hysterectomy. No adnexal masses. Other: No abdominal wall hernia Musculoskeletal: No acute bone abnormality. No lytic or blastic bone lesion. Review of the MIP images confirms the above findings. IMPRESSION: 1. No evidence of thoracoabdominal aortic aneurysm or dissection. 2. 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 This is incompletely evaluated on this examination.  Correlation with carotid artery duplex sonography or CT arteriography may be helpful for further evaluation. 3. 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 Stable global cardiomegaly. 4. Stable enlargement of the central pulmonary arteries in keeping with changes of pulmonary arterial hypertension. 5. 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 The more proximal demonstrates a bilobed configuration with thrombosis of 1 of the lobes measuring 1.7 x 2.1 x 2.2 cm the second demonstrating broad communication with the feeding vessel measures 1.3 x 1.6 x 1.4 cm in dimension. 6. Shotty gastrohepatic lymph node measuring 10 mm in short axis diameter, unchanged from prior examination though slightly enlarged since remote prior examination of 03/04/2020, nonspecific. Electronically Signed   By: Fidela Salisbury M.D.   On: 03/08/2022 20:02   DG Chest Portable 1 View  Result Date: 03/08/2022 CLINICAL DATA:  Provided history: Chest pain, shortness of breath. History of atrial  fibrillation and congestive heart failure. Prior open heart surgery. EXAM: PORTABLE CHEST 1 VIEW COMPARISON:  Prior chest radiographs 02/12/2022 and earlier. FINDINGS: Prior median sternotomy. Prior left atrial appendage clipping. Cardiomegaly. No appreciable airspace consolidation or pulmonary edema. No evidence of pleural effusion or pneumothorax. No acute bony abnormality identified. IMPRESSION: No evidence of acute cardiopulmonary abnormality. Cardiomegaly. Electronically Signed   By: Kellie Simmering D.O.   On: 03/08/2022 15:23    Cardiac Studies   Echocardiogram June 2023 showed ejection fraction 55%, severe right ventricular enlargement, severe RV dysfunction, severe left atrial enlargement, severe right atrial enlargement, status post mitral valve repair with mild mitral stenosis (mean gradient 3 mmHg), status post tricuspid valve repair with severe tricuspid regurgitation.     Right heart catheterization June 2023 showed RA pressure 27, PA pressure 60/27, pulmonary capillary wedge pressure 25 with a V wave of 35.    Follow-up [facility] July 2023 showed normal pulmonary capillary wedge pressure.   Patient Profile     66 y.o. female here with atypical chest pain, acute on chronic systolic and diastolic heart failure with multiple admissions  Assessment & Plan    Acute on chronic systolic and diastolic heart failure - GFR has been less than 20%, not on SGLT2 inhibitor. -BNP 205 -IV Lasix 120 mg twice a day and metolazone 2.5 mg added yesterday creatinine increased to 3.12 from 2.46.  Could be nephrotoxic contrast administered yesterday.  Monitor closely. -She has had several right heart catheterizations in the past year given the challenges of understanding her fluid status.  Minimal coronary artery disease seen on prior cardiac catheterization.  Reassuring.  Obstructive sleep apnea - Continue with CPAP  Prolonged QT - Amiodarone.  No adverse arrhythmias.  Chronic kidney disease stage V - Watch for worsening renal function.  Creatinine is currently elevated.  Nephrotoxicity following CTA to rule out dissection.  Paroxysmal atrial fibrillation - Currently in sinus rhythm.  Amiodarone  Mitral valve repair, tricuspid valve repair - Residual severe tricuspid regurgitation.  Has been evaluated by structural heart team, Dr. Burt Knack.  Not candidate for further tricuspid valve replacement with valve in ring technique.    For questions or updates, please contact [place] Please consult [email or web address] for contact info under        Signed, Candee Furbish, MD  03/10/2022, 10:07 AM

## 2022-03-10 NOTE — Progress Notes (Signed)
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  Admitted with heart failure exacerbation and atypical chest pain.  Followed by cardiology.  Remains in the hospital due to persistent symptoms.   Assessment & Plan:   Acute on chronic systolic and diastolic congestive heart failure: Difficult to assess volume status but persistently symptomatic. Patient is currently on high-dose IV Lasix 120 mg twice a day, continued.  Additional metolazone. Urine output 675 mL today.  Cardiology following.  Not a candidate for goal-directed therapy due to poor renal functions.  Cardiology anticipating conservative management.  Obstructive sleep apnea: On CPAP at night.  Chest pain: Atypical.  Persistent epigastric and retrosternal pain.  EKG nonischemic.  Troponins nonischemic.  Prior cardiac catheterization with minimal coronary artery disease. Symptomatic treatment, pain medications IV or oral opiates.  Will start patient on Pepcid 20 mg IV twice daily today.  Acute kidney injury on chronic kidney disease stage IV: Worsening renal functions.  Monitor closely.  Patient received contrast to rule out dissection.  Recheck tomorrow morning.  Intake output monitoring.  Paroxysmal A-fib: Currently in sinus rhythm.  On amiodarone.  Therapeutic on Eliquis.  Valvular heart disease, mitral valve repair tricuspid valve repair with residual severe tricuspid regurgitation: Probably exacerbating fluid imbalance.  Seen by structural heart team, advised she is not able to have surgery.  Hypokalemia: Replace.  Monitor.  DVT prophylaxis:  apixaban  (ELIQUIS) tablet 5 mg   Code Status: Full code Family Communication: None Disposition Plan: Status is: Observation The patient will require care spanning > 2 midnights and should be moved to inpatient because: Significant electrolyte abnormalities, IV diuresis     Consultants:  Cardiology  Procedures:  None  Antimicrobials:  None   Subjective: Patient seen and examined.  I was called in to evaluate patient with complaints of severe retrosternal chest pain and nausea along with epigastric pain.  Patient looked anxious and complained of pain mostly on the xiphisternal area.  Improved with a dose of fentanyl.  EKG was nonischemic.  Objective: Vitals:   03/09/22 2139 03/10/22 0528 03/10/22 0949 03/10/22 0953  BP: 131/72 123/65 99/64 (!) 119/57  Pulse: 83 79 79 77  Resp:  17 18   Temp:  98.6 F (37 C) 98.3 F (36.8 C)   TempSrc:  Oral Oral   SpO2: 99% 98% 97%   Weight:  124.1 kg    Height:        Intake/Output Summary (Last 24 hours) at 03/10/2022 1113 Last data filed at 03/10/2022 0543 Gross per 24 hour  Intake 600 ml  Output 675 ml  Net -75 ml   Filed Weights   03/08/22 1445 03/08/22 2246 03/10/22 0528  Weight: 121.5 kg 124.8 kg 124.1 kg    Examination:  General exam: Appears anxious.  On room air.  In mild distress with pain. Respiratory system: No added sounds. Cardiovascular system: S1 & S2 heard, RRR.  No pitting edema.  She has large chronically edematous legs. Gastrointestinal system: Soft.  Mildly tender in the epigastric region.  No rigidity or guarding. Central nervous system: Alert and oriented. No focal neurological deficits. Extremities: Symmetric 5 x 5 power. Skin: No rashes,  lesions or ulcers Psychiatry: anxious.    Data Reviewed: I have personally reviewed following labs and imaging studies  CBC: Recent Labs  Lab 03/08/22 1503  WBC 4.5  HGB 10.7*  HCT 34.6*  MCV 84.0  PLT 287   Basic Metabolic Panel: Recent Labs  Lab 03/08/22 1503  03/09/22 0558 03/09/22 1422 03/10/22 0233  NA 143 141 140 137  K 3.2* 2.8* 3.3* 3.2*  CL 104 103 99 101  CO2 '29 29 29 25  '$ GLUCOSE 84 96 131* 103*  BUN 37* 34* 32* 36*  CREATININE 2.46* 2.46* 2.83* 3.12*  CALCIUM 10.1 9.0 9.2 8.9  MG  --  1.8  --  1.8   GFR: Estimated Creatinine Clearance: 24.6 mL/min (A) (by C-G formula based on SCr of 3.12 mg/dL (H)). Liver Function Tests: No results for input(s): "AST", "ALT", "ALKPHOS", "BILITOT", "PROT", "ALBUMIN" in the last 168 hours. No results for input(s): "LIPASE", "AMYLASE" in the last 168 hours. No results for input(s): "AMMONIA" in the last 168 hours. Coagulation Profile: No results for input(s): "INR", "PROTIME" in the last 168 hours. Cardiac Enzymes: No results for input(s): "CKTOTAL", "CKMB", "CKMBINDEX", "TROPONINI" in the last 168 hours. BNP (last 3 results) No results for input(s): "PROBNP" in the last 8760 hours. HbA1C: No results for input(s): "HGBA1C" in the last 72 hours. CBG: Recent Labs  Lab 03/09/22 0835 03/09/22 1201 03/09/22 1708 03/09/22 2247 03/10/22 0806  GLUCAP 105* 126* 143* 127* 107*   Lipid Profile: No results for input(s): "CHOL", "HDL", "LDLCALC", "TRIG", "CHOLHDL", "LDLDIRECT" in the last 72 hours. Thyroid Function Tests: No results for input(s): "TSH", "T4TOTAL", "FREET4", "T3FREE", "THYROIDAB" in the last 72 hours. Anemia Panel: No results for input(s): "VITAMINB12", "FOLATE", "FERRITIN", "TIBC", "IRON", "RETICCTPCT" in the last 72 hours. Sepsis Labs: No results for input(s): "PROCALCITON", "LATICACIDVEN" in the last 168 hours.  Recent Results (from the past 240 hour(s))  SARS Coronavirus 2 by RT PCR (hospital order, performed in [facility] hospital lab) *cepheid single result test* Anterior Nasal Swab     Status: None   Collection Time: 03/08/22  3:55 PM   Specimen: Anterior Nasal Swab  Result Value Ref Range Status   SARS Coronavirus 2 by RT PCR NEGATIVE NEGATIVE Final    Comment:  (NOTE) SARS-CoV-2 target nucleic acids are NOT DETECTED.  The SARS-CoV-2 RNA is generally detectable in upper and lower respiratory specimens during the acute phase of infection. The lowest concentration of SARS-CoV-2 viral copies this assay can detect is 250 copies / mL. A negative result does not preclude SARS-CoV-2 infection and should not be used as the sole basis for treatment or other patient management decisions.  A negative result may occur with improper specimen collection / handling, submission of specimen other than nasopharyngeal swab, presence of viral mutation(s) within the areas targeted by this assay, and inadequate number of viral copies (<250 copies / mL). A negative result must be combined with clinical observations, patient history, and epidemiological information.  Fact Sheet for Patients:   https://www.patel.info/  Fact Sheet for Healthcare Providers: https://hall.com/  This test is not yet approved or  cleared by the [place] FDA and has been authorized for detection and/or diagnosis of SARS-CoV-2 by FDA under an Emergency Use Authorization (EUA).  This EUA will remain in effect (meaning this test can be used) for the duration of the COVID-19 declaration under Section 564(b)(1) of the Act, 21 U.S.C. section 360bbb-3(b)(1), unless the authorization is terminated or revoked sooner.  Performed at [facility], [address] [place], [place] [address]          Radiology Studies: VAS US CAROTID  Result Date: 03/09/2022 Carotid Arterial Duplex Study Patient Name:  NATHANIEL YADEN  Date of Exam:   03/09/2022 Medical Rec #: [id number]       Accession #:    [id number] Date of Birth: 19-Jun-1956       Patient Gender: F Patient Age:   40 years Exam Location:  [facility] Procedure:      VAS US CAROTID Referring Phys: Wandra Feinstein RATHORE  --------------------------------------------------------------------------------  Risk Factors:      Hypertension, hyperlipidemia, Diabetes. Comparison Study:  12/24/2020 1-39% bilateral ICA stenosis Performing Technologist: Maudry Mayhew MHA, RDMS, RVT, RDCS  Examination Guidelines: A complete evaluation includes B-mode imaging, spectral Doppler, color Doppler, and power Doppler as needed of all accessible portions of each vessel. Bilateral testing is considered an integral part of a complete examination. Limited examinations for reoccurring indications may be performed as noted.  Right Carotid Findings: +----------+--------+--------+--------+--------------------------+--------+           PSV cm/sEDV cm/sStenosisPlaque Description        Comments +----------+--------+--------+--------+--------------------------+--------+ CCA Prox  120     19                                                 +----------+--------+--------+--------+--------------------------+--------+ CCA Distal84      16                                                 +----------+--------+--------+--------+--------------------------+--------+ ICA Prox  65      19              irregular and heterogenous         +----------+--------+--------+--------+--------------------------+--------+ ICA Distal78      29                                                 +----------+--------+--------+--------+--------------------------+--------+ ECA       87                                                         +----------+--------+--------+--------+--------------------------+--------+ +----------+--------+-------+----------------+-------------------+           PSV cm/sEDV cmsDescribe        Arm Pressure (mmHG) +----------+--------+-------+----------------+-------------------+ [id number]            Multiphasic, WNL                    +----------+--------+-------+----------------+-------------------+  +---------+--------+--+--------+--+---------+ VertebralPSV cm/s68EDV cm/s21Antegrade +---------+--------+--+--------+--+---------+  Left Carotid Findings: +----------+--------+--------+--------+-------------------------------+--------+           PSV cm/sEDV cm/sStenosisPlaque Description             Comments +----------+--------+--------+--------+-------------------------------+--------+ CCA Prox  109     18                                                      +----------+--------+--------+--------+-------------------------------+--------+  CCA Distal89      25                                                      +----------+--------+--------+--------+-------------------------------+--------+ ICA Prox  73      22              heterogenous, focal and                                                   calcific                                +----------+--------+--------+--------+-------------------------------+--------+ ICA Distal76      22                                                      +----------+--------+--------+--------+-------------------------------+--------+ ECA       100     10                                                      +----------+--------+--------+--------+-------------------------------+--------+ +----------+--------+--------+----------------+-------------------+           PSV cm/sEDV cm/sDescribe        Arm Pressure (mmHG) +----------+--------+--------+----------------+-------------------+ [id number]             Multiphasic, WNL                    +----------+--------+--------+----------------+-------------------+ +---------+--------+--+--------+--+---------+ VertebralPSV cm/s78EDV cm/s24Antegrade +---------+--------+--+--------+--+---------+   Summary: Right Carotid: Velocities in the right ICA are consistent with a 1-39% stenosis. Left Carotid: Velocities in the left ICA are consistent with a 1-39% stenosis.  Vertebrals:  Bilateral vertebral arteries demonstrate antegrade flow. Subclavians: Normal flow hemodynamics were seen in bilateral subclavian              arteries. *See table(s) above for measurements and observations.  Electronically signed by Jamelle Haring on 03/09/2022 at 8:21:24 PM.    Final    CT Angio Chest/Abd/Pel for Dissection W and/or Wo Contrast  Result Date: 03/08/2022 CLINICAL DATA:  Acute aortic syndrome (AAS) EXAM: CT ANGIOGRAPHY CHEST, ABDOMEN AND PELVIS TECHNIQUE: Non-contrast CT of the chest was initially obtained. Multidetector CT imaging through the chest, abdomen and pelvis was performed using the standard protocol during bolus administration of intravenous contrast. Multiplanar reconstructed images and MIPs were obtained and reviewed to evaluate the vascular anatomy. RADIATION DOSE REDUCTION: This exam was performed according to the departmental dose-optimization program which includes automated exposure control, adjustment of the mA and/or kV according to patient size and/or use of iterative reconstruction technique. CONTRAST:  127m OMNIPAQUE IOHEXOL 350 MG/ML SOLN COMPARISON:  CT chest 11/21/2021, CT abdomen pelvis 09/23/2021 FINDINGS: CTA CHEST FINDINGS Cardiovascular: Mitral and tricuspid valve replacement has been performed. There is stable global cardiomegaly. No pericardial effusion. Left atrial clipping has been performed  central pulmonary arteries are enlarged in keeping with changes of pulmonary arterial hypertension, stable since prior examination. The thoracic aorta is normal in course and caliber there is no evidence of intramural hematoma, aneurysm, or dissection. Bovine arch anatomy with wide patency of the arch vasculature proximally. 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 Mediastinum/Nodes: No enlarged mediastinal, hilar, or axillary lymph nodes. Thyroid gland, trachea, and esophagus  demonstrate no significant findings. Lungs/Pleura: Lungs are clear. No pleural effusion or pneumothorax. Musculoskeletal: No acute bone abnormality. No lytic or blastic bone lesion. Review of the MIP images confirms the above findings. CTA ABDOMEN AND PELVIS FINDINGS VASCULAR Aorta: Mild atherosclerotic calcification within the abdominal aorta. No aortic aneurysm or dissection. No periaortic inflammatory change. Celiac: Patent without evidence of aneurysm, dissection, vasculitis or significant stenosis. SMA: Patent without evidence of aneurysm, dissection, vasculitis or significant stenosis. Renals: Single renal arteries bilaterally. No evidence of hemodynamically significant stenosis. Normal vascular morphology. 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 IMA: Patent without evidence of aneurysm, dissection, vasculitis or significant stenosis. Inflow: Patent without evidence of aneurysm, dissection, vasculitis or significant stenosis. Veins: No obvious venous abnormality within the limitations of this arterial phase study. Review of the MIP images confirms the above findings. NON-VASCULAR Hepatobiliary: No focal liver abnormality is seen. Status post cholecystectomy. No biliary dilatation. Pancreas: Unremarkable Spleen: Unremarkable Adrenals/Urinary Tract: Adrenal glands are unremarkable. Kidneys are normal, without renal calculi, focal lesion, or hydronephrosis. Bladder is unremarkable. Stomach/Bowel: Stomach is within normal limits. Appendix appears normal. No evidence of bowel wall thickening, distention, or inflammatory changes. Lymphatic: A shotty gastrohepatic lymph node is identified at axial image # 141/5 measuring 10 mm in short axis  diameter, unchanged from prior examination though slightly enlarged since remote prior examination of 03/04/2020, nonspecific. No additional pathologic adenopathy is seen within the abdomen and pelvis. Reproductive: Status post hysterectomy. No adnexal masses. Other: No abdominal wall hernia Musculoskeletal: No acute bone abnormality. No lytic or blastic bone lesion. Review of the MIP images confirms the above findings. IMPRESSION: 1. No evidence of thoracoabdominal aortic aneurysm or dissection. 2. 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 This is incompletely evaluated on this examination. Correlation with carotid artery duplex sonography or CT arteriography may be helpful for further evaluation. 3. 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 Stable global cardiomegaly. 4. Stable enlargement of the central pulmonary arteries in keeping with changes of pulmonary arterial hypertension. 5. 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 The more proximal demonstrates a bilobed configuration with thrombosis of 1 of the lobes measuring 1.7 x 2.1 x 2.2 cm the second demonstrating broad communication with the feeding vessel measures 1.3 x 1.6 x 1.4 cm in dimension. 6. Shotty gastrohepatic lymph node measuring 10 mm in short axis diameter, unchanged from prior examination though slightly enlarged since remote prior examination of 03/04/2020, nonspecific. Electronically Signed   By: Fidela Salisbury M.D.   On: 03/08/2022 20:02   DG Chest Portable 1 View  Result Date: 03/08/2022 CLINICAL DATA:  Provided history: Chest pain, shortness of breath. History of atrial fibrillation  and congestive heart failure. Prior open heart surgery. EXAM: PORTABLE CHEST 1 VIEW COMPARISON:  Prior chest radiographs 02/12/2022 and earlier. FINDINGS: Prior median sternotomy. Prior left atrial appendage clipping. Cardiomegaly. No appreciable airspace  consolidation or pulmonary edema. No evidence of pleural effusion or pneumothorax. No acute bony abnormality identified. IMPRESSION: No evidence of acute cardiopulmonary abnormality. Cardiomegaly. Electronically Signed   By: Kellie Simmering D.O.   On: 03/08/2022 15:23        Scheduled Meds:  amiodarone  200 mg Oral Daily   apixaban  5 mg Oral BID   insulin aspart  0-5 Units Subcutaneous QHS   insulin aspart  0-6 Units Subcutaneous TID WC   metolazone  2.5 mg Oral Once   metoprolol succinate  25 mg Oral BID   potassium chloride  40 mEq Oral BID   rosuvastatin  10 mg Oral Daily   Continuous Infusions:  famotidine (PEPCID) IV 20 mg (03/10/22 0953)   furosemide Stopped (03/10/22 0812)     LOS: 0 days    Time spent: 35 minutes    Barb Merino, MD [facility] Pager [phone]

## 2022-03-10 NOTE — Progress Notes (Addendum)
Pt complained of nausea and vomiting. CP 9/10, sharp, heaving, midsternum. MD Ghimire came bedside. BP 135/83. HR 80. 1 nitro given at 0842. Zofran given at 0844. At 0847 CP 8/10,BP 99/64. MD Ghimire notified.  Update: Pain after fentanyl 5/10, nausea decreased, pepcid started.

## 2022-03-10 NOTE — Telephone Encounter (Signed)
Routed to PCP 

## 2022-03-11 DIAGNOSIS — I5043 Acute on chronic combined systolic (congestive) and diastolic (congestive) heart failure: Secondary | ICD-10-CM | POA: Diagnosis not present

## 2022-03-11 LAB — BASIC METABOLIC PANEL
Anion gap: 9 (ref 5–15)
BUN: 43 mg/dL — ABNORMAL HIGH (ref 8–23)
CO2: 27 mmol/L (ref 22–32)
Calcium: 8.8 mg/dL — ABNORMAL LOW (ref 8.9–10.3)
Chloride: 101 mmol/L (ref 98–111)
Creatinine, Ser: 4.77 mg/dL — ABNORMAL HIGH (ref 0.44–1.00)
GFR, Estimated: 10 mL/min — ABNORMAL LOW (ref >60)
Glucose, Bld: 100 mg/dL — ABNORMAL HIGH (ref 70–99)
Potassium: 3.1 mmol/L — ABNORMAL LOW (ref 3.5–5.1)
Sodium: 137 mmol/L (ref 135–145)

## 2022-03-11 LAB — GLUCOSE, CAPILLARY
Glucose-Capillary: 89 mg/dL (ref 70–99)
Glucose-Capillary: 110 mg/dL — ABNORMAL HIGH (ref 70–99)
Glucose-Capillary: 181 mg/dL — ABNORMAL HIGH (ref 70–99)
Glucose-Capillary: 113 mg/dL — ABNORMAL HIGH (ref 70–99)

## 2022-03-11 LAB — MAGNESIUM: Magnesium: 1.8 mg/dL (ref 1.7–2.4)

## 2022-03-11 MED ORDER — DOCUSATE SODIUM 100 MG PO CAPS
100.0000 mg | ORAL_CAPSULE | Freq: Two times a day (BID) | ORAL | Status: DC | PRN
  Administered 2022-03-11 – 2022-03-12 (×3): 100 mg via ORAL
  Filled 2022-03-11 (×3): qty 1

## 2022-03-11 NOTE — Progress Notes (Signed)
Progress Note  Patient Name: [name] Date of Encounter: 03/11/2022  [facility] Cardiologist: Buford Dresser, MD   Subjective   Renal failure worsening.   Inpatient Medications    Scheduled Meds:  amiodarone  200 mg Oral Daily   apixaban  5 mg Oral BID   insulin aspart  0-5 Units Subcutaneous QHS   insulin aspart  0-6 Units Subcutaneous TID WC   metoprolol succinate  25 mg Oral BID   potassium chloride  40 mEq Oral BID   rosuvastatin  10 mg Oral Daily   Continuous Infusions:  famotidine (PEPCID) IV 20 mg (03/11/22 0948)   PRN Meds: acetaminophen **OR** acetaminophen, fentaNYL (SUBLIMAZE) injection, nitroGLYCERIN, ondansetron (ZOFRAN) IV   Vital Signs    Vitals:   03/10/22 2107 03/10/22 2232 03/11/22 0430 03/11/22 0940  BP: 121/71 129/65 126/65 114/75  Pulse: 77 80 79 77  Resp: 18  16   Temp: 98.7 F (37.1 C)  98.6 F (37 C)   TempSrc: Oral  Oral   SpO2: 96%  100%   Weight:   131.5 kg   Height:        Intake/Output Summary (Last 24 hours) at 03/11/2022 1028 Last data filed at 03/10/2022 1600 Gross per 24 hour  Intake 50 ml  Output --  Net 50 ml      03/11/2022    4:30 AM 03/10/2022    5:28 AM 03/08/2022   10:46 PM  Last 3 Weights  Weight (lbs) 289 lb 14.5 oz 273 lb 11.2 oz 275 lb 1.6 oz  Weight (kg) 131.5 kg 124.15 kg 124.785 kg      Telemetry    Personally Reviewed  ECG    Personally Reviewed  Physical Exam   GEN: No acute distress.   Neck: No JVD Cardiac: RRR, II/VI systolic murmur. No rubs, or gallops.  Respiratory: Clear to auscultation bilaterally. GI: Soft, nontender, non-distended  MS: 2+ pitting bilateral LE edema; No deformity. Neuro:  Nonfocal  Psych: Normal affect   Labs    High Sensitivity Troponin:   Recent Labs  Lab 02/12/22 1226 02/12/22 1457 03/08/22 1503 03/08/22 1712  TROPONINIHS '7 6 5 5     '$ Chemistry Recent Labs  Lab 03/09/22 0558 03/09/22 1422 03/10/22 0233 03/11/22 0158  NA 141 140  137 137  K 2.8* 3.3* 3.2* 3.1*  CL 103 99 101 101  CO2 '29 29 25 27  '$ GLUCOSE 96 131* 103* 100*  BUN 34* 32* 36* 43*  CREATININE 2.46* 2.83* 3.12* 4.77*  CALCIUM 9.0 9.2 8.9 8.8*  MG 1.8  --  1.8 1.8  GFRNONAA 21* 18* 16* 10*  ANIONGAP '9 12 11 9    '$ Lipids No results for input(s): "CHOL", "TRIG", "HDL", "LABVLDL", "LDLCALC", "CHOLHDL" in the last 168 hours.  Hematology Recent Labs  Lab 03/08/22 1503  WBC 4.5  RBC 4.12  HGB 10.7*  HCT 34.6*  MCV 84.0  MCH 26.0  MCHC 30.9  RDW 18.0*  PLT 184   Thyroid No results for input(s): "TSH", "FREET4" in the last 168 hours.  BNP Recent Labs  Lab 03/08/22 1506  BNP 205.3*    DDimer  Recent Labs  Lab 03/08/22 1501  DDIMER 0.70*     Radiology    VAS US CAROTID  Result Date: 03/09/2022 Carotid Arterial Duplex Study Patient Name:  [name]  Date of Exam:   03/09/2022 Medical Rec #: [id number]       Accession #:  [id number] Date of Birth: 07/16/1956       Patient Gender: F Patient Age:   9 years Exam Location:  [facility] Procedure:      VAS US CAROTID Referring Phys: Wandra Feinstein RATHORE --------------------------------------------------------------------------------  Risk Factors:      Hypertension, hyperlipidemia, Diabetes. Comparison Study:  12/24/2020 1-39% bilateral ICA stenosis Performing Technologist: Maudry Mayhew MHA, RDMS, RVT, RDCS  Examination Guidelines: A complete evaluation includes B-mode imaging, spectral Doppler, color Doppler, and power Doppler as needed of all accessible portions of each vessel. Bilateral testing is considered an integral part of a complete examination. Limited examinations for reoccurring indications may be performed as noted.  Right Carotid Findings: +----------+--------+--------+--------+--------------------------+--------+           PSV cm/sEDV cm/sStenosisPlaque Description        Comments +----------+--------+--------+--------+--------------------------+--------+ CCA Prox   120     19                                                 +----------+--------+--------+--------+--------------------------+--------+ CCA Distal84      16                                                 +----------+--------+--------+--------+--------------------------+--------+ ICA Prox  65      19              irregular and heterogenous         +----------+--------+--------+--------+--------------------------+--------+ ICA Distal78      29                                                 +----------+--------+--------+--------+--------------------------+--------+ ECA       87                                                         +----------+--------+--------+--------+--------------------------+--------+ +----------+--------+-------+----------------+-------------------+           PSV cm/sEDV cmsDescribe        Arm Pressure (mmHG) +----------+--------+-------+----------------+-------------------+ [id number]            Multiphasic, WNL                    +----------+--------+-------+----------------+-------------------+ +---------+--------+--+--------+--+---------+ VertebralPSV cm/s68EDV cm/s21Antegrade +---------+--------+--+--------+--+---------+  Left Carotid Findings: +----------+--------+--------+--------+-------------------------------+--------+           PSV cm/sEDV cm/sStenosisPlaque Description             Comments +----------+--------+--------+--------+-------------------------------+--------+ CCA Prox  109     18                                                      +----------+--------+--------+--------+-------------------------------+--------+ CCA Distal89      25                                                      +----------+--------+--------+--------+-------------------------------+--------+  ICA Prox  73      22              heterogenous, focal and                                                   calcific                                 +----------+--------+--------+--------+-------------------------------+--------+ ICA Distal76      22                                                      +----------+--------+--------+--------+-------------------------------+--------+ ECA       100     10                                                      +----------+--------+--------+--------+-------------------------------+--------+ +----------+--------+--------+----------------+-------------------+           PSV cm/sEDV cm/sDescribe        Arm Pressure (mmHG) +----------+--------+--------+----------------+-------------------+ [id number]             Multiphasic, WNL                    +----------+--------+--------+----------------+-------------------+ +---------+--------+--+--------+--+---------+ VertebralPSV cm/s78EDV cm/s24Antegrade +---------+--------+--+--------+--+---------+   Summary: Right Carotid: Velocities in the right ICA are consistent with a 1-39% stenosis. Left Carotid: Velocities in the left ICA are consistent with a 1-39% stenosis. Vertebrals:  Bilateral vertebral arteries demonstrate antegrade flow. Subclavians: Normal flow hemodynamics were seen in bilateral subclavian              arteries. *See table(s) above for measurements and observations.  Electronically signed by Jamelle Haring on 03/09/2022 at 8:21:24 PM.    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 Warm and wet on exam. IV diuretics becoming less effective  #CKDV Medicine following.  #OSA CPAP  #pAF In sinus rhythm. Continue amiodarone Continue eliquis    For questions or updates, please contact [facility] Please consult [email or web address] for contact info under        Signed, Vickie Epley, MD  03/11/2022, 10:28 AM

## 2022-03-11 NOTE — Progress Notes (Signed)
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  Admitted with heart failure exacerbation and atypical chest pain.  Followed by cardiology.  Remains in the hospital due to persistent symptoms.   Assessment & Plan:   Acute on chronic systolic and diastolic congestive heart failure: Difficult to assess volume status but persistently symptomatic. High dose of IV Lasix with no much diuresis but worsening renal functions, will hold dose today. Urine output not reported.  Cardiology following.  Not a candidate for goal-directed therapy due to poor renal functions.  Cardiology anticipating conservative management.  Obstructive sleep apnea: Currently untreated.  Will need a new study and device.  Chest pain: Atypical.  Persistent epigastric and retrosternal pain.  EKG nonischemic.  Troponins nonischemic.  Prior cardiac catheterization with minimal coronary artery disease. Improved with IV Pepcid and intermittent use of fentanyl.  Acute coronary syndrome ruled out.  Acute kidney injury on chronic kidney disease stage IV: Worsening renal functions.  Monitor closely.  Patient received contrast to rule out dissection.  Recheck tomorrow morning.  Intake output monitoring. We will hold Lasix today.  Paroxysmal A-fib: Currently in sinus rhythm.  On amiodarone.  Therapeutic on Eliquis.  Valvular heart disease, mitral valve repair tricuspid valve repair with residual severe tricuspid regurgitation: Probably exacerbating fluid imbalance.  Seen by structural heart team, advised she is not able to have surgery.  Hypokalemia:  Replace.  Monitor.  DVT prophylaxis:  apixaban (ELIQUIS) tablet 5 mg   Code Status: Full code Family Communication: None Disposition Plan: Status is: Inpatient.  Remains inpatient due to significantly abnormal renal functions.   Consultants:  Cardiology  Procedures:  None  Antimicrobials:  None   Subjective:  Patient seen and examined.  Chest pain much improved.  Nausea and epigastric discomfort has mostly improved.  She has not walked around so does not know how much she will do regarding the shortness of breath.  Asking when she is going home.  Objective: Vitals:   03/10/22 2107 03/10/22 2232 03/11/22 0430 03/11/22 0940  BP: 121/71 129/65 126/65 114/75  Pulse: 77 80 79 77  Resp: 18  16   Temp: 98.7 F (37.1 C)  98.6 F (37 C)   TempSrc: Oral  Oral   SpO2: 96%  100%   Weight:   131.5 kg   Height:        Intake/Output Summary (Last 24 hours) at 03/11/2022 1139 Last data filed at 03/10/2022 1600 Gross per 24 hour  Intake 50 ml  Output --  Net 50 ml   Filed Weights   03/08/22 2246 03/10/22 0528 03/11/22 0430  Weight: 124.8 kg 124.1 kg 131.5 kg    Examination:  General exam: On room air.  Looks comfortable today. Respiratory system: Difficult to assess.  No added sounds. Cardiovascular system: S1 & S2 heard, RRR.  No pitting edema.  She has large chronically edematous legs. Gastrointestinal system: Soft.  Nontender.  No rigidity or guarding. Central nervous system: Alert and oriented. No focal neurological deficits. Extremities: Symmetric 5 x 5 power. Skin: No rashes, lesions or ulcers Psychiatry: In normal mood.    Data Reviewed: I have personally reviewed following labs and  imaging studies  CBC: Recent Labs  Lab 03/08/22 1503  WBC 4.5  HGB 10.7*  HCT 34.6*  MCV 84.0  PLT 625   Basic Metabolic Panel: Recent Labs  Lab 03/08/22 1503 03/09/22 0558 03/09/22 1422 03/10/22 0233 03/11/22 0158  NA 143 141 140 137 137  K 3.2* 2.8* 3.3* 3.2* 3.1*   CL 104 103 99 101 101  CO2 '29 29 29 25 27  '$ GLUCOSE 84 96 131* 103* 100*  BUN 37* 34* 32* 36* 43*  CREATININE 2.46* 2.46* 2.83* 3.12* 4.77*  CALCIUM 10.1 9.0 9.2 8.9 8.8*  MG  --  1.8  --  1.8 1.8   GFR: Estimated Creatinine Clearance: 16.6 mL/min (A) (by C-G formula based on SCr of 4.77 mg/dL (H)). Liver Function Tests: No results for input(s): "AST", "ALT", "ALKPHOS", "BILITOT", "PROT", "ALBUMIN" in the last 168 hours. No results for input(s): "LIPASE", "AMYLASE" in the last 168 hours. No results for input(s): "AMMONIA" in the last 168 hours. Coagulation Profile: No results for input(s): "INR", "PROTIME" in the last 168 hours. Cardiac Enzymes: No results for input(s): "CKTOTAL", "CKMB", "CKMBINDEX", "TROPONINI" in the last 168 hours. BNP (last 3 results) No results for input(s): "PROBNP" in the last 8760 hours. HbA1C: No results for input(s): "HGBA1C" in the last 72 hours. CBG: Recent Labs  Lab 03/10/22 1210 03/10/22 1610 03/10/22 2143 03/11/22 0746 03/11/22 1121  GLUCAP 115* 99 113* 89 110*   Lipid Profile: No results for input(s): "CHOL", "HDL", "LDLCALC", "TRIG", "CHOLHDL", "LDLDIRECT" in the last 72 hours. Thyroid Function Tests: No results for input(s): "TSH", "T4TOTAL", "FREET4", "T3FREE", "THYROIDAB" in the last 72 hours. Anemia Panel: No results for input(s): "VITAMINB12", "FOLATE", "FERRITIN", "TIBC", "IRON", "RETICCTPCT" in the last 72 hours. Sepsis Labs: No results for input(s): "PROCALCITON", "LATICACIDVEN" in the last 168 hours.  Recent Results (from the past 240 hour(s))  SARS Coronavirus 2 by RT PCR (hospital order, performed in [facility] hospital lab) *cepheid single result test* Anterior Nasal Swab     Status: None   Collection Time: 03/08/22  3:55 PM   Specimen: Anterior Nasal Swab  Result Value Ref Range Status   SARS Coronavirus 2 by RT PCR NEGATIVE NEGATIVE Final    Comment: (NOTE) SARS-CoV-2 target nucleic acids are NOT DETECTED.  The  SARS-CoV-2 RNA is generally detectable in upper and lower respiratory specimens during the acute phase of infection. The lowest concentration of SARS-CoV-2 viral copies this assay can detect is 250 copies / mL. A negative result does not preclude SARS-CoV-2 infection and should not be used as the sole basis for treatment or other patient management decisions.  A negative result may occur with improper specimen collection / handling, submission of specimen other than nasopharyngeal swab, presence of viral mutation(s) within the areas targeted by this assay, and inadequate number of viral copies (<250 copies / mL). A negative result must be combined with clinical observations, patient history, and epidemiological information.  Fact Sheet for Patients:   https://www.patel.info/  Fact Sheet for Healthcare Providers: https://hall.com/  This test is not yet approved or  cleared by the [place] FDA and has been authorized for detection and/or diagnosis of SARS-CoV-2 by FDA under an Emergency Use Authorization (EUA).  This EUA will remain in effect (meaning this test can be used) for the duration of the COVID-19 declaration under Section 564(b)(1) of the Act, 21 U.S.C. section 360bbb-3(b)(1), unless the authorization is terminated or revoked sooner.  Performed at [facility], [address]  [place], [place] [address]          Radiology Studies: No results found.      Scheduled Meds:  amiodarone  200 mg Oral Daily   apixaban  5 mg Oral BID   insulin aspart  0-5 Units Subcutaneous QHS   insulin aspart  0-6 Units Subcutaneous TID WC   metoprolol succinate  25 mg Oral BID   potassium chloride  40 mEq Oral BID   rosuvastatin  10 mg Oral Daily   Continuous Infusions:  famotidine (PEPCID) IV 20 mg (03/11/22 0948)     LOS: 1 day    Time spent: 35 minutes    Barb Merino, MD [facility] Pager  [phone]

## 2022-03-12 ENCOUNTER — Inpatient Hospital Stay (HOSPITAL_COMMUNITY): Payer: 59

## 2022-03-12 DIAGNOSIS — I5043 Acute on chronic combined systolic (congestive) and diastolic (congestive) heart failure: Secondary | ICD-10-CM | POA: Diagnosis not present

## 2022-03-12 LAB — COMPREHENSIVE METABOLIC PANEL
ALT: 21 U/L (ref 0–44)
AST: 24 U/L (ref 15–41)
Albumin: 3.2 g/dL — ABNORMAL LOW (ref 3.5–5.0)
Alkaline Phosphatase: 122 U/L (ref 38–126)
Anion gap: 9 (ref 5–15)
BUN: 58 mg/dL — ABNORMAL HIGH (ref 8–23)
CO2: 26 mmol/L (ref 22–32)
Calcium: 9.1 mg/dL (ref 8.9–10.3)
Chloride: 104 mmol/L (ref 98–111)
Creatinine, Ser: 6.41 mg/dL — ABNORMAL HIGH (ref 0.44–1.00)
GFR, Estimated: 7 mL/min — ABNORMAL LOW (ref >60)
Glucose, Bld: 109 mg/dL — ABNORMAL HIGH (ref 70–99)
Potassium: 3.9 mmol/L (ref 3.5–5.1)
Sodium: 139 mmol/L (ref 135–145)
Total Bilirubin: 0.6 mg/dL (ref 0.3–1.2)
Total Protein: 7.3 g/dL (ref 6.5–8.1)

## 2022-03-12 LAB — GLUCOSE, CAPILLARY
Glucose-Capillary: 101 mg/dL — ABNORMAL HIGH (ref 70–99)
Glucose-Capillary: 116 mg/dL — ABNORMAL HIGH (ref 70–99)
Glucose-Capillary: 115 mg/dL — ABNORMAL HIGH (ref 70–99)
Glucose-Capillary: 118 mg/dL — ABNORMAL HIGH (ref 70–99)

## 2022-03-12 LAB — URINALYSIS, ROUTINE W REFLEX MICROSCOPIC
Bilirubin Urine: NEGATIVE
Glucose, UA: NEGATIVE mg/dL
Ketones, ur: NEGATIVE mg/dL
Leukocytes,Ua: NEGATIVE
Nitrite: NEGATIVE
Protein, ur: NEGATIVE mg/dL
Specific Gravity, Urine: 1.015 (ref 1.005–1.030)
pH: 5 (ref 5.0–8.0)

## 2022-03-12 LAB — MAGNESIUM: Magnesium: 2 mg/dL (ref 1.7–2.4)

## 2022-03-12 LAB — SURGICAL PCR SCREEN
MRSA, PCR: NEGATIVE
Staphylococcus aureus: NEGATIVE

## 2022-03-12 MED ORDER — METOLAZONE 5 MG PO TABS
2.5000 mg | ORAL_TABLET | Freq: Once | ORAL | Status: AC
  Administered 2022-03-12: 2.5 mg via ORAL
  Filled 2022-03-12: qty 1

## 2022-03-12 MED ORDER — FUROSEMIDE 10 MG/ML IJ SOLN
80.0000 mg | Freq: Once | INTRAMUSCULAR | Status: AC
  Administered 2022-03-12: 80 mg via INTRAVENOUS
  Filled 2022-03-12: qty 8

## 2022-03-12 MED ORDER — CHLORHEXIDINE GLUCONATE CLOTH 2 % EX PADS
6.0000 | MEDICATED_PAD | Freq: Once | CUTANEOUS | Status: AC
  Administered 2022-03-12: 6 via TOPICAL

## 2022-03-12 MED ORDER — HEPARIN (PORCINE) 25000 UT/250ML-% IV SOLN
1150.0000 [IU]/h | INTRAVENOUS | Status: DC
  Administered 2022-03-12: 1300 [IU]/h via INTRAVENOUS
  Administered 2022-03-13: 1150 [IU]/h via INTRAVENOUS
  Filled 2022-03-12 (×2): qty 250

## 2022-03-12 MED ORDER — CHLORHEXIDINE GLUCONATE CLOTH 2 % EX PADS
6.0000 | MEDICATED_PAD | Freq: Once | CUTANEOUS | Status: AC
  Administered 2022-03-13: 6 via TOPICAL

## 2022-03-12 MED ORDER — MUPIROCIN 2 % EX OINT: 1.0000 | TOPICAL_OINTMENT | Freq: Two times a day (BID) | CUTANEOUS | Status: DC

## 2022-03-12 MED ORDER — SODIUM CHLORIDE 0.9 % IV SOLN
10.0000 mg | INTRAVENOUS | Status: DC
Start: 2022-03-12 — End: 2022-03-14
  Administered 2022-03-12 – 2022-03-13 (×2): 10 mg via INTRAVENOUS
  Filled 2022-03-12 (×3): qty 1

## 2022-03-12 NOTE — Significant Event (Signed)
Since patient is going to have tunneled dialysis catheter placed.  Patient's nurse discussed with me about the Eliquis.  Discussed with pharmacy.  Plan is to hold Eliquis and transition to heparin infusion for patient's known history of A-fib until procedure done.  [name]

## 2022-03-12 NOTE — Progress Notes (Signed)
Mobility Specialist Progress Note    03/12/22 1320  Mobility  Activity Ambulated with assistance in hallway  Level of Assistance Standby assist, set-up cues, supervision of patient - no hands on  Assistive Device Front wheel walker  Distance Ambulated (ft) 200 ft (100+100)  Activity Response Tolerated well  $Mobility charge 1 Mobility   Pre-Mobility: 77 HR Post-Mobility: 77 HR  Pt received sitting EOB and agreeable. No complaints on walk. Took x1 standing rest break leaning against wall. Returned to sitting EOB with call bell in reach.    Hildred Alamin Mobility Specialist

## 2022-03-12 NOTE — Progress Notes (Signed)
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  Admitted with heart failure exacerbation and atypical chest pain.  Followed by cardiology.  Remains in the hospital due to persistent symptoms. She developed significant renal dysfunction and oliguria.   Assessment & Plan:   Acute on chronic systolic and diastolic congestive heart failure: Difficult to assess volume status but persistently symptomatic. High dose of IV Lasix with no much diuresis but worsening renal functions, holding diuresis. Urine output not reported.  Cardiology following.  Not a candidate for goal-directed therapy due to poor renal functions.  Cardiology anticipating conservative management.  Obstructive sleep apnea: Currently untreated.  Will need a new study and device.  Chest pain: Atypical.  Persistent epigastric and retrosternal pain.  EKG nonischemic.  Troponins nonischemic.  Prior cardiac catheterization with minimal coronary artery disease. Improved with IV Pepcid and intermittent use of fentanyl.  Acute coronary syndrome ruled out.  Acute kidney injury on chronic kidney disease stage IV: Worsening renal functions.  Monitor closely.  Patient received contrast to rule out dissection.  Intake output monitoring. Significantly worse renal functions today.  Discussed with nephrology for consultation.  Patient may need hemodialysis for fluid removal.  Paroxysmal A-fib: Currently in sinus rhythm.  On amiodarone.  Therapeutic on Eliquis.  Valvular heart disease, mitral valve repair tricuspid valve repair with residual severe tricuspid  regurgitation: Probably exacerbating fluid imbalance.  Seen by structural heart team, advised she is not able to have surgery.  Hypokalemia: Replaced.  DVT prophylaxis:  apixaban (ELIQUIS) tablet 5 mg   Code Status: Full code Family Communication: None Disposition Plan: Status is: Inpatient.  Remains inpatient due to significantly abnormal renal functions.   Consultants:  Cardiology  Procedures:  None  Antimicrobials:  None   Subjective:  Seen and examined.  Chest pain is improved.  Abdominal pain and discomfort has improved.  Not sure about shortness of breath but she tells me that she feels much better. I told her that her kidneys are not functioning well and will have nephrology team see her.  Objective: Vitals:   03/11/22 2232 03/12/22 0519 03/12/22 0750 03/12/22 0956  BP: 125/62 120/62  137/84  Pulse: 77 78  76  Resp:  16    Temp:  98.3 F (36.8 C)    TempSrc:  Oral    SpO2:  98%    Weight:  134.4 kg 125.9 kg   Height:        Intake/Output Summary (Last 24 hours) at 03/12/2022 1142 Last data filed at 03/11/2022 2359 Gross per 24 hour  Intake 483.57 ml  Output --  Net 483.57 ml   Filed Weights   03/11/22 0430 03/12/22 0519 03/12/22 0750  Weight: 131.5 kg 134.4 kg 125.9 kg    Examination:  General exam: On room air.  Pleasant and conversant today.  Appropriately anxious. Respiratory system: Difficult to assess.  No added sounds. Cardiovascular system: S1 & S2 heard, RRR.  No pitting edema.  She has large chronically edematous legs. Gastrointestinal system: Soft.  Nontender.  No rigidity or guarding. Central nervous system: Alert and oriented. No focal neurological deficits. Extremities: Symmetric 5 x 5 power. Skin: No rashes,  lesions or ulcers Psychiatry: Slightly anxious.    Data Reviewed: I have personally reviewed following labs and imaging studies  CBC: Recent Labs  Lab 03/08/22 1503  WBC 4.5  HGB 10.7*  HCT 34.6*  MCV 84.0  PLT 947    Basic Metabolic Panel: Recent Labs  Lab 03/09/22 0558 03/09/22 1422 03/10/22 0233 03/11/22 0158 03/12/22 0758  NA 141 140 137 137 139  K 2.8* 3.3* 3.2* 3.1* 3.9  CL 103 99 101 101 104  CO2 '29 29 25 27 26  '$ GLUCOSE 96 131* 103* 100* 109*  BUN 34* 32* 36* 43* 58*  CREATININE 2.46* 2.83* 3.12* 4.77* 6.41*  CALCIUM 9.0 9.2 8.9 8.8* 9.1  MG 1.8  --  1.8 1.8 2.0   GFR: Estimated Creatinine Clearance: 12.1 mL/min (A) (by C-G formula based on SCr of 6.41 mg/dL (H)). Liver Function Tests: Recent Labs  Lab 03/12/22 0758  AST 24  ALT 21  ALKPHOS 122  BILITOT 0.6  PROT 7.3  ALBUMIN 3.2*   No results for input(s): "LIPASE", "AMYLASE" in the last 168 hours. No results for input(s): "AMMONIA" in the last 168 hours. Coagulation Profile: No results for input(s): "INR", "PROTIME" in the last 168 hours. Cardiac Enzymes: No results for input(s): "CKTOTAL", "CKMB", "CKMBINDEX", "TROPONINI" in the last 168 hours. BNP (last 3 results) No results for input(s): "PROBNP" in the last 8760 hours. HbA1C: No results for input(s): "HGBA1C" in the last 72 hours. CBG: Recent Labs  Lab 03/11/22 1121 03/11/22 1641 03/11/22 2156 03/12/22 0748 03/12/22 1119  GLUCAP 110* 181* 113* 101* 116*   Lipid Profile: No results for input(s): "CHOL", "HDL", "LDLCALC", "TRIG", "CHOLHDL", "LDLDIRECT" in the last 72 hours. Thyroid Function Tests: No results for input(s): "TSH", "T4TOTAL", "FREET4", "T3FREE", "THYROIDAB" in the last 72 hours. Anemia Panel: No results for input(s): "VITAMINB12", "FOLATE", "FERRITIN", "TIBC", "IRON", "RETICCTPCT" in the last 72 hours. Sepsis Labs: No results for input(s): "PROCALCITON", "LATICACIDVEN" in the last 168 hours.  Recent Results (from the past 240 hour(s))  SARS Coronavirus 2 by RT PCR (hospital order, performed in [facility] hospital lab) *cepheid single result test* Anterior Nasal Swab     Status: None   Collection Time: 03/08/22  3:55 PM   Specimen:  Anterior Nasal Swab  Result Value Ref Range Status   SARS Coronavirus 2 by RT PCR NEGATIVE NEGATIVE Final    Comment: (NOTE) SARS-CoV-2 target nucleic acids are NOT DETECTED.  The SARS-CoV-2 RNA is generally detectable in upper and lower respiratory specimens during the acute phase of infection. The lowest concentration of SARS-CoV-2 viral copies this assay can detect is 250 copies / mL. A negative result does not preclude SARS-CoV-2 infection and should not be used as the sole basis for treatment or other patient management decisions.  A negative result may occur with improper specimen collection / handling, submission of specimen other than nasopharyngeal swab, presence of viral mutation(s) within the areas targeted by this assay, and inadequate number of viral copies (<250 copies / mL). A negative result must be combined with clinical observations, patient history, and epidemiological information.  Fact Sheet for Patients:   https://www.patel.info/  Fact Sheet for Healthcare Providers: https://hall.com/  This test is not yet approved or  cleared by the [place] FDA and has been authorized for detection and/or diagnosis of SARS-CoV-2 by FDA under an Emergency Use Authorization (EUA).  This EUA will remain in effect (meaning this test can be used) for the duration of the COVID-19 declaration under  Section 564(b)(1) of the Act, 21 U.S.C. section 360bbb-3(b)(1), unless the authorization is terminated or revoked sooner.  Performed at [facility], [address] [place], [place] [address]          Radiology Studies: No results found.      Scheduled Meds:  amiodarone  200 mg Oral Daily   apixaban  5 mg Oral BID   insulin aspart  0-5 Units Subcutaneous QHS   insulin aspart  0-6 Units Subcutaneous TID WC   metoprolol succinate  25 mg Oral BID   rosuvastatin  10 mg Oral Daily   Continuous  Infusions:  famotidine (PEPCID) IV       LOS: 2 days    Time spent: 35 minutes    Barb Merino, MD [facility] Pager [phone]

## 2022-03-12 NOTE — Progress Notes (Signed)
ANTICOAGULATION CONSULT NOTE - Initial Consult  Pharmacy Consult for heparin Indication: atrial fibrillation  Allergies  Allergen Reactions   Bee Pollen Anaphylaxis and Other (See Comments)    UNCONFIRMED, but IS allergic to bee VENOM   Bee Venom Anaphylaxis   Sulfa Antibiotics Itching    Patient Measurements: Height: '5\' 7"'$  (170.2 cm) Weight: 125.9 kg (277 lb 9 oz) IBW/kg (Calculated) : 61.6 Heparin Dosing Weight: 91 kg  Vital Signs: Temp: 97.6 F (36.4 C) (07/23 2036) Temp Source: Oral (07/23 2036) BP: 132/70 (07/23 2036) Pulse Rate: 70 (07/23 2036)  Labs: Recent Labs    03/10/22 0233 03/11/22 0158 03/12/22 0758  CREATININE 3.12* 4.77* 6.41*    Estimated Creatinine Clearance: 12.1 mL/min (A) (by C-G formula based on SCr of 6.41 mg/dL (H)).   Medical History: Past Medical History:  Diagnosis Date   Acute on chronic systolic CHF (congestive heart failure) (HCC) 12/21/2020   Allergic rhinitis    Arthritis    Asthma    Chronic diastolic CHF (congestive heart failure) (HCC)    COPD (chronic obstructive pulmonary disease) (HCC)    Depression    DM (diabetes mellitus) ([place])    DVT (deep vein thrombosis) in pregnancy    GERD (gastroesophageal reflux disease)    HTN (hypertension)    Hyperlipidemia    Obesity    Pneumonia 04/2018   RIGHT LOBE   Sleep apnea    compliant with CPAP    Medications:  Medications Prior to Admission  Medication Sig Dispense Refill Last Dose   acetaminophen (TYLENOL) 325 MG tablet Take 2 tablets (650 mg total) by mouth every 6 (six) hours as needed for mild pain or moderate pain.   unk   albuterol (VENTOLIN HFA) 108 (90 Base) MCG/ACT inhaler INHALE 1-2 PUFFS BY MOUTH EVERY 6 HOURS AS NEEDED FOR WHEEZE OR SHORTNESS OF BREATH (Patient taking differently: Inhale 2 puffs into the lungs every 6 (six) hours as needed for wheezing or shortness of breath.) 18 g 1 Past Week   amiodarone (PACERONE) 200 MG tablet Take 1 tablet (200 mg total) by  mouth daily. (Patient taking differently: Take 200 mg by mouth 2 (two) times daily.) 90 tablet 1 03/08/2022 at 1000   apixaban (ELIQUIS) 5 MG TABS tablet TAKE ONE TABLET BY MOUTH TWICE DAILY at [place] (Patient taking differently: Take 5 mg by mouth in the morning and at bedtime.) 60 tablet 1 03/08/2022 at 1000   busPIRone (BUSPAR) 5 MG tablet TAKE 1 TABLET BY MOUTH TWICE A DAY (Patient taking differently: Take 5 mg by mouth 2 (two) times daily.) 180 tablet 1 03/08/2022   colchicine 0.6 MG tablet Take 0.5 tablets (0.3 mg total) by mouth daily. 45 tablet 0 03/08/2022   diphenhydrAMINE (BENADRYL) 25 mg capsule Take 25 mg by mouth every 6 (six) hours as needed for allergies or itching.   unk   DULoxetine (CYMBALTA) 60 MG capsule TAKE 1 CAPSULE BY MOUTH EVERY DAY (Patient taking differently: Take 60 mg by mouth daily.) 90 capsule 3 03/08/2022   EPINEPHrine 0.3 mg/0.3 mL IJ SOAJ injection Inject 0.3 mg into the muscle as needed for anaphylaxis. 1 each 1 unk   fluticasone (FLONASE) 50 MCG/ACT nasal spray Place 2 sprays into both nostrils daily. (Patient taking differently: Place 2 sprays into both nostrils daily as needed for allergies or rhinitis.) 16 g 6 unk   insulin aspart (NOVOLOG) 100 UNIT/ML injection Inject 2-10 Units into the skin 3 (three) times daily before meals. Per  sliding scale (Patient taking differently: Inject 2-10 Units into the skin See admin instructions. Inject 2-10 units into the skin three times a day before meals, per sliding scale)   03/08/2022 at am   lidocaine (XYLOCAINE) 5 % ointment Apply 1 application. topically as needed for moderate pain. First choice is lidocaine patch, but if not covered/expensive, please fill ointment (Patient taking differently: Apply 1 application  topically 2 (two) times daily as needed for moderate pain.) 35.44 g 3 unk   LINZESS 72 MCG capsule Take 72 mcg by mouth every morning.   03/08/2022 at am   loratadine (CLARITIN) 10 MG tablet Take 1 tablet (10 mg  total) by mouth daily. 30 tablet 11 03/08/2022 at am   metolazone (ZAROXOLYN) 2.5 MG tablet Take 1 tablet (2.5 mg total) by mouth once a week. Monday.  Take extra tablet as needed for weight gain or lower extremity edema. (Patient taking differently: Take 2.5 mg by mouth See admin instructions. Take 2.5 mg by mouth on Mondays and Wednesdays) 8 tablet 11 03/08/2022   metoprolol succinate (TOPROL-XL) 25 MG 24 hr tablet TAKE 1 TABLET (25 MG) BY MOUTH IN THE MORNING AND AT BEDTIME (Patient taking differently: Take 25 mg by mouth in the morning and at bedtime.) 180 tablet 1 03/08/2022 at 1000   Multiple Vitamins-Minerals (MULTIVITAMIN WOMEN PO) Take 1 tablet by mouth daily.   03/08/2022   nitroGLYCERIN (NITROSTAT) 0.4 MG SL tablet Place 1 tablet (0.4 mg total) under the tongue every 5 (five) minutes x 3 doses as needed for chest pain. 25 tablet 12 unk   pantoprazole (PROTONIX) 40 MG tablet TAKE ONE TABLET BY MOUTH DAILY AT 9AM (Patient taking differently: Take 40 mg by mouth daily before breakfast.) 90 tablet 0 03/08/2022 at am   polyethylene glycol powder (GLYCOLAX/MIRALAX) 17 GM/SCOOP powder Take 17 g by mouth daily. (Patient taking differently: Take 17 g by mouth daily as needed for mild constipation.) 238 g 0 unk   potassium chloride SA (KLOR-CON M) 20 MEQ tablet Take 3 tablets (60 mEq total) by mouth daily. Take an additional 20 meq with every dose of METOLAZONE (Patient taking differently: Take 40 mEq by mouth 3 (three) times daily.) 180 tablet 3 03/08/2022   pregabalin (LYRICA) 100 MG capsule TAKE 1 CAPSULE BY MOUTH THREE TIMES A DAY (Patient taking differently: Take 100 mg by mouth 3 (three) times daily as needed (for pain).) 90 capsule 3 unk   RESTASIS 0.05 % ophthalmic emulsion Place 1 drop into both eyes in the morning and at bedtime.  12 03/08/2022 at am   rosuvastatin (CRESTOR) 10 MG tablet Take 1 tablet (10 mg total) by mouth every evening. (Patient taking differently: Take 10 mg by mouth daily.)    03/08/2022 at am   senna-docusate (SENOKOT-S) 8.6-50 MG tablet Take 1 tablet by mouth 2 (two) times daily. 14 tablet 0 03/08/2022 at am   torsemide (DEMADEX) 20 MG tablet Take 4 tablets (80 mg total) by mouth daily. Can take an additional 2 tablets as needed for increased weight gain (Patient taking differently: Take 60 mg by mouth in the morning.) 360 tablet 1 5/63/8756 at am   TRULICITY 1.5 EP/3.2RJ SOPN INJECT 1.5 MG INTO THE SKIN ONCE A WEEK. (Patient taking differently: Inject 1.5 mg into the skin every Wednesday.) 0.5 mL 2 03/08/2022    Assessment: [name] on apixaban for PAF. Pt with AKI on CKD4 and will need long-term HD. Plan for dialysis catheter placement either Monday or  Tuesday. To transition from apixaban to heparin for procedures. Last dose of apixaban 7/23 1000. Apixaban will be affecting heparin levels so will utilize aPTT for monitoring until levels are correlating.  Goal of Therapy:  Heparin level 0.3-0.7 units/ml, aPTT 66-102 sec Monitor platelets by anticoagulation protocol: Yes   Plan:  At 2200, start heparin gtt at 1300 units/hr Will f/u heparin level and aPTT in 8hours Daily heparin level, aPTT, and CBC  Sherlon Handing, PharmD, BCPS Please see amion for complete clinical pharmacist phone list 03/12/2022,9:32 PM

## 2022-03-12 NOTE — Progress Notes (Signed)
Patient voided earlier in to [place]wick, urine saturated the bed, unable to get I&Os for this occurrence.

## 2022-03-12 NOTE — Consult Note (Signed)
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 Now likely ESRD at this point. -ineffective diuresis with recurrent admissions for hypervolemia and now with uremic symptoms, had a lengthy discussion in regards to renal replacement therapy. Discussed rationale and process. Likely will be long-term HD. Patient agrees with proceeding forward, discussed w/ daughter as well who also agrees -consulting IR for tunneled dialysis catheter. NPO after midnight. I'm okay with TDC being performed on Tuesday (IR will likely not be able to get to her tomorrow given their schedule) -UA and renal u/s today -diuretic challenge today: will give her a dose of lasix and metolazone today -Continue to monitor daily Cr, Dose meds for GFR<15 -Maintain MAP>65 for optimal renal perfusion.  -Agree with holding ACE-I, avoid further nephrotoxins including NSAIDS, Morphine.  Unless absolutely necessary, avoid CT with contrast and/or MRI with gadolinium.     Acute on chronic systolic and diastolic CHF -Diuresis plan as above.  Not a GDMT candidate given kidney function.  Dialysis/diuretic challenge plan as above  Chest pain -Per cardiology and primary  Paroxysmal A-fib -On Amio and Eliquis  Anemia due to chronic disease: -Transfuse for Hgb<7 g/dL -Hemoglobin 10.7 on 7/19, will check iron panel and CBC tomorrow  Recommendations conveyed to primary service.    [facility] 03/12/2022 10:35 AM   _____________________________________________________________________________________   History of Present Illness: [name] is a/an 66 y.o. female with a past medical history of CKD 4, chronic biventricular systolic CHF, status post mitral and tricuspid valve replacement, paroxysmal A-fib, CAD, DM 2, chronic anemia, obesity, COPD, hyperlipidemia who presents to [facility] with chest pain and shortness of breath/dyspnea on exertion lower  extremity edema and weight gain.  On chronic torsemide 60 mg daily with metolazone 2.5 mg once a week.  She presented with a creatinine of 2.46 which has been steadily rising now 6.41 today despite holding diuretics. Did have a CTA performed on admission to rule out dissection. Patient seen and examined this AM. Patient reports that has not been urinating as much as she typically has. Daughter on patient's video call as well. Has also been experiencing n/v, loss of appetite, brain fog, pruritis, tremors which are all  new symptoms. She does report that her breathing is relatively okay at rest.    Medications:  Current Facility-Administered Medications  Medication Dose Route Frequency Provider Last Rate Last Admin   acetaminophen (TYLENOL) tablet 650 mg  650 mg Oral Q6H PRN Shela Leff, MD   650 mg at 03/09/22 9811   Or   acetaminophen (TYLENOL) suppository 650 mg  650 mg Rectal Q6H PRN Shela Leff, MD       amiodarone (PACERONE) tablet 200 mg  200 mg Oral Daily Lelon Perla, MD   200 mg at 03/12/22 0956   apixaban (ELIQUIS) tablet 5 mg  5 mg Oral BID Lelon Perla, MD   5 mg at 03/12/22 0956   docusate sodium (COLACE) capsule 100 mg  100 mg Oral BID PRN Barb Merino, MD   100 mg at 03/11/22 2233   famotidine (PEPCID) 10 mg in sodium chloride 0.9 % 25 mL  10 mg Intravenous Q24H Reome, Earle J, RPH       fentaNYL (SUBLIMAZE) injection 25 mcg  25 mcg Intravenous Q4H PRN Barb Merino, MD   25 mcg at 03/11/22 2233   insulin aspart (novoLOG) injection 0-5 Units  0-5 Units Subcutaneous QHS Shela Leff, MD  insulin aspart (novoLOG) injection 0-6 Units  0-6 Units Subcutaneous TID WC Shela Leff, MD   1 Units at 03/11/22 1752   metoprolol succinate (TOPROL-XL) 24 hr tablet 25 mg  25 mg Oral BID Lelon Perla, MD   25 mg at 03/12/22 1856   nitroGLYCERIN (NITROSTAT) SL tablet 0.4 mg  0.4 mg Sublingual Q5 min PRN Barb Merino, MD       ondansetron [facility])  injection 4 mg  4 mg Intravenous Q6H PRN Barb Merino, MD   4 mg at 03/10/22 0844   rosuvastatin (CRESTOR) tablet 10 mg  10 mg Oral Daily Lelon Perla, MD   10 mg at 03/12/22 3149     ALLERGIES Bee pollen, Bee venom, and Sulfa antibiotics  MEDICAL HISTORY Past Medical History:  Diagnosis Date   Acute on chronic systolic CHF (congestive heart failure) ([place]) 12/21/2020   Allergic rhinitis    Arthritis    Asthma    Chronic diastolic CHF (congestive heart failure) (HCC)    COPD (chronic obstructive pulmonary disease) ([place])    Depression    DM (diabetes mellitus) ([place])    DVT (deep vein thrombosis) in pregnancy    GERD (gastroesophageal reflux disease)    HTN (hypertension)    Hyperlipidemia    Obesity    Pneumonia 04/2018   RIGHT LOBE   Sleep apnea    compliant with CPAP     SOCIAL HISTORY Social History   Socioeconomic History   Marital status: Significant Other    Spouse name: Not on file   Number of children: 2   Years of education: Not on file   Highest education level: Some college, no degree  Occupational History   Not on file  Tobacco Use   Smoking status: Never   Smokeless tobacco: Never  Vaping Use   Vaping Use: Never used  Substance and Sexual Activity   Alcohol use: No   Drug use: No   Sexual activity: Not Currently  Other Topics Concern   Not on file  Social History Narrative   Pt lives in [place] with spouse.  2 grown children.   Previously worked in [employer] at [employer].  Now on disability   Social Determinants of Health   Financial Resource Strain: Low Risk  (01/19/2022)   Overall Financial Resource Strain (CARDIA)    Difficulty of Paying Living Expenses: Not hard at all  Food Insecurity: No Food Insecurity (01/19/2022)   Hunger Vital Sign    Worried About Running Out of Food in the Last Year: Never true    Ran Out of Food in the Last Year: Never true  Transportation Needs: No Transportation Needs (01/19/2022)   PRAPARE - Armed forces logistics/support/administrative officer (Medical): No    Lack of Transportation (Non-Medical): No  Physical Activity: Insufficiently Active (10/28/2021)   Exercise Vital Sign    Days of Exercise per Week: 2 days    Minutes of Exercise per Session: 70 min  Stress: No Stress Concern Present (10/28/2021)   [place]    Feeling of Stress : Not at all  Social Connections: Socially Isolated (10/28/2021)   Social Connection and Isolation Panel [NHANES]    Frequency of Communication with Friends and Family: More than three times a week    Frequency of Social Gatherings with Friends and Family: Three times a week    Attends Religious Services: Never    Active Member of Clubs or  Organizations: No    Attends Archivist Meetings: Never    Marital Status: Widowed  Intimate Partner Violence: Not At Risk (10/28/2021)   Humiliation, Afraid, Rape, and Kick questionnaire    Fear of Current or Ex-Partner: No    Emotionally Abused: No    Physically Abused: No    Sexually Abused: No     FAMILY HISTORY Family History  Problem Relation Age of Onset   Breast cancer Mother    Cancer Mother    Hypertension Mother    Cancer Father    Hypertension Sister    Hypertension Brother    CAD Other      Review of Systems: 12 systems reviewed Otherwise as per HPI, all other systems reviewed and negative  Physical Exam: Vitals:   03/12/22 0519 03/12/22 0956  BP: 120/62 137/84  Pulse: 78 76  Resp: 16   Temp: 98.3 F (36.8 C)   SpO2: 98%    No intake/output data recorded.  Intake/Output Summary (Last 24 hours) at 03/12/2022 1035 Last data filed at 03/11/2022 2359 Gross per 24 hour  Intake 483.57 ml  Output --  Net 483.57 ml   General: well-appearing, no acute distress HEENT: anicteric sclera, oropharynx clear without lesions CV: regular rate, normal rhythm, + systolic murmurs, no gallops, no rubs Lungs: fine crackles bibasilar, normal  work of breathing Abd: obese, soft, non-tender, non-distended Skin: no visible lesions or rashes Psych: alert, engaged, appropriate mood and affect Musculoskeletal: trace pedal edema b/l Neuro: normal speech, no gross focal deficits, tremulous but no asterixis  Test Results Reviewed Lab Results  Component Value Date   NA 139 03/12/2022   K 3.9 03/12/2022   CL 104 03/12/2022   CO2 26 03/12/2022   BUN 58 (H) 03/12/2022   CREATININE 6.41 (H) 03/12/2022   CALCIUM 9.1 03/12/2022   ALBUMIN 3.2 (L) 03/12/2022   PHOS 4.1 02/24/2022     I have reviewed all relevant outside healthcare records related to the patient's kidney injury.

## 2022-03-12 NOTE — Progress Notes (Signed)
Progress Note  Patient Name: [name] Date of Encounter: 03/12/2022  [facility] Cardiologist: Buford Dresser, MD   Subjective   [place]. Diuretics held yesterday. No labwork ordered for this AM.  Inpatient Medications    Scheduled Meds:  amiodarone  200 mg Oral Daily   apixaban  5 mg Oral BID   insulin aspart  0-5 Units Subcutaneous QHS   insulin aspart  0-6 Units Subcutaneous TID WC   metoprolol succinate  25 mg Oral BID   rosuvastatin  10 mg Oral Daily   Continuous Infusions:  famotidine (PEPCID) IV Stopped (03/11/22 2327)   PRN Meds: acetaminophen **OR** acetaminophen, docusate sodium, fentaNYL (SUBLIMAZE) injection, nitroGLYCERIN, ondansetron (ZOFRAN) IV   Vital Signs    Vitals:   03/11/22 1454 03/11/22 1900 03/11/22 2232 03/12/22 0519  BP: 127/76 (!) 104/91 125/62 120/62  Pulse: 81 74 77 78  Resp: '15 16  16  '$ Temp: 97.6 F (36.4 C) 97.7 F (36.5 C)  98.3 F (36.8 C)  TempSrc: Oral Oral  Oral  SpO2: 100% 99%  98%  Weight:    134.4 kg  Height:        Intake/Output Summary (Last 24 hours) at 03/12/2022 0717 Last data filed at 03/11/2022 2359 Gross per 24 hour  Intake 663.57 ml  Output --  Net 663.57 ml       03/12/2022    5:19 AM 03/11/2022    4:30 AM 03/10/2022    5:28 AM  Last 3 Weights  Weight (lbs) 296 lb 4.8 oz 289 lb 14.5 oz 273 lb 11.2 oz  Weight (kg) 134.4 kg 131.5 kg 124.15 kg      Telemetry    Personally Reviewed  ECG    Personally Reviewed  Physical Exam   GEN: No acute distress.   Neck: No JVD Cardiac: RRR, II/VI systolic murmur. No rubs, or gallops.  Respiratory: Clear to auscultation bilaterally. GI: Soft, nontender, non-distended  MS: Trivial bilateral LE edema; No deformity. Neuro:  Nonfocal  Psych: Normal affect   Labs    High Sensitivity Troponin:   Recent Labs  Lab 02/12/22 1226 02/12/22 1457 03/08/22 1503 03/08/22 1712  TROPONINIHS '7 6 5 5      '$ Chemistry Recent Labs  Lab 03/09/22 0558  03/09/22 1422 03/10/22 0233 03/11/22 0158  NA 141 140 137 137  K 2.8* 3.3* 3.2* 3.1*  CL 103 99 101 101  CO2 '29 29 25 27  '$ GLUCOSE 96 131* 103* 100*  BUN 34* 32* 36* 43*  CREATININE 2.46* 2.83* 3.12* 4.77*  CALCIUM 9.0 9.2 8.9 8.8*  MG 1.8  --  1.8 1.8  GFRNONAA 21* 18* 16* 10*  ANIONGAP '9 12 11 9     '$ Lipids No results for input(s): "CHOL", "TRIG", "HDL", "LABVLDL", "LDLCALC", "CHOLHDL" in the last 168 hours.  Hematology Recent Labs  Lab 03/08/22 1503  WBC 4.5  RBC 4.12  HGB 10.7*  HCT 34.6*  MCV 84.0  MCH 26.0  MCHC 30.9  RDW 18.0*  PLT 184    Thyroid No results for input(s): "TSH", "FREET4" in the last 168 hours.  BNP Recent Labs  Lab 03/08/22 1506  BNP 205.3*     DDimer  Recent Labs  Lab 03/08/22 1501  DDIMER 0.70*      Radiology    No results found.  Cardiac Studies    Assessment & Plan    #Acute on chronic systolic and diastolic HF NYHA III-IV. Warm and wet on exam. IV diuretics  becoming less effective  #CKDV Medicine following. Diuretics held yesterday due to worsening renal function. I am concerned that she may require HD to manage volume status given the constellation of valvular abnormalities. -repeat CMP/Mg ordered for this AM  #OSA CPAP  #pAF In sinus rhythm. Continue amiodarone Continue eliquis    For questions or updates, please contact [facility] Please consult [email or web address] for contact info under        Signed, Vickie Epley, MD  03/12/2022, 7:17 AM

## 2022-03-13 ENCOUNTER — Inpatient Hospital Stay (INDEPENDENT_AMBULATORY_CARE_PROVIDER_SITE_OTHER): Payer: 59 | Admitting: Primary Care

## 2022-03-13 ENCOUNTER — Other Ambulatory Visit (INDEPENDENT_AMBULATORY_CARE_PROVIDER_SITE_OTHER): Payer: Self-pay | Admitting: Primary Care

## 2022-03-13 ENCOUNTER — Inpatient Hospital Stay (HOSPITAL_COMMUNITY): Payer: 59

## 2022-03-13 DIAGNOSIS — I480 Paroxysmal atrial fibrillation: Secondary | ICD-10-CM | POA: Diagnosis not present

## 2022-03-13 DIAGNOSIS — E1169 Type 2 diabetes mellitus with other specified complication: Secondary | ICD-10-CM

## 2022-03-13 DIAGNOSIS — R079 Chest pain, unspecified: Secondary | ICD-10-CM | POA: Diagnosis not present

## 2022-03-13 DIAGNOSIS — I5043 Acute on chronic combined systolic (congestive) and diastolic (congestive) heart failure: Secondary | ICD-10-CM

## 2022-03-13 LAB — HEPARIN LEVEL (UNFRACTIONATED): Heparin Unfractionated: >1.1 IU/mL — ABNORMAL HIGH (ref 0.30–0.70)

## 2022-03-13 LAB — RENAL FUNCTION PANEL
Albumin: 3.1 g/dL — ABNORMAL LOW (ref 3.5–5.0)
Anion gap: 11 (ref 5–15)
BUN: 67 mg/dL — ABNORMAL HIGH (ref 8–23)
CO2: 23 mmol/L (ref 22–32)
Calcium: 8.9 mg/dL (ref 8.9–10.3)
Chloride: 104 mmol/L (ref 98–111)
Creatinine, Ser: 6.78 mg/dL — ABNORMAL HIGH (ref 0.44–1.00)
GFR, Estimated: 6 mL/min — ABNORMAL LOW (ref >60)
Glucose, Bld: 98 mg/dL (ref 70–99)
Phosphorus: 6.1 mg/dL — ABNORMAL HIGH (ref 2.5–4.6)
Potassium: 3.7 mmol/L (ref 3.5–5.1)
Sodium: 138 mmol/L (ref 135–145)

## 2022-03-13 LAB — IRON AND TIBC
Iron: 63 ug/dL (ref 28–170)
Saturation Ratios: 27 % (ref 10.4–31.8)
TIBC: 231 ug/dL — ABNORMAL LOW (ref 250–450)
UIBC: 168 ug/dL

## 2022-03-13 LAB — HEPATITIS B SURFACE ANTIGEN: Hepatitis B Surface Ag: NONREACTIVE

## 2022-03-13 LAB — GLUCOSE, CAPILLARY
Glucose-Capillary: 102 mg/dL — ABNORMAL HIGH (ref 70–99)
Glucose-Capillary: 90 mg/dL (ref 70–99)
Glucose-Capillary: 187 mg/dL — ABNORMAL HIGH (ref 70–99)
Glucose-Capillary: 111 mg/dL — ABNORMAL HIGH (ref 70–99)

## 2022-03-13 LAB — CBC
HCT: 29.9 % — ABNORMAL LOW (ref 36.0–46.0)
Hemoglobin: 9.5 g/dL — ABNORMAL LOW (ref 12.0–15.0)
MCH: 26 pg (ref 26.0–34.0)
MCHC: 31.8 g/dL (ref 30.0–36.0)
MCV: 81.9 fL (ref 80.0–100.0)
Platelets: 153 10*3/uL (ref 150–400)
RBC: 3.65 MIL/uL — ABNORMAL LOW (ref 3.87–5.11)
RDW: 18.6 % — ABNORMAL HIGH (ref 11.5–15.5)
WBC: 4.9 10*3/uL (ref 4.0–10.5)
nRBC: 0 % (ref 0.0–0.2)

## 2022-03-13 LAB — ECHOCARDIOGRAM LIMITED
Height: 67 in
S' Lateral: 3.7 cm
Single Plane A4C EF: 51.2 %
Weight: 4493.86 oz

## 2022-03-13 LAB — APTT
aPTT: 104 seconds — ABNORMAL HIGH (ref 24–36)
aPTT: >200 seconds (ref 24–36)

## 2022-03-13 LAB — HEPATITIS C ANTIBODY: HCV Ab: NONREACTIVE

## 2022-03-13 LAB — HEPATITIS B SURFACE ANTIBODY,QUALITATIVE: Hep B S Ab: NONREACTIVE

## 2022-03-13 LAB — FERRITIN: Ferritin: 118 ng/mL (ref 11–307)

## 2022-03-13 MED ORDER — HEPARIN (PORCINE) 25000 UT/250ML-% IV SOLN
800.0000 [IU]/h | INTRAVENOUS | Status: DC
  Administered 2022-03-13: 900 [IU]/h via INTRAVENOUS

## 2022-03-13 MED ORDER — CHLORHEXIDINE GLUCONATE CLOTH 2 % EX PADS
6.0000 | MEDICATED_PAD | Freq: Every day | CUTANEOUS | Status: DC
  Administered 2022-03-14 – 2022-03-17 (×4): 6 via TOPICAL

## 2022-03-13 MED ORDER — SODIUM CHLORIDE 0.9 % IV SOLN
250.0000 mg | Freq: Every day | INTRAVENOUS | Status: AC
  Administered 2022-03-13 – 2022-03-16 (×4): 250 mg via INTRAVENOUS
  Filled 2022-03-13 (×4): qty 20

## 2022-03-13 MED ORDER — SODIUM CHLORIDE 0.9 % IV SOLN: INTRAVENOUS | Status: DC | PRN

## 2022-03-13 MED ORDER — DARBEPOETIN ALFA 60 MCG/0.3ML IJ SOSY
60.0000 ug | PREFILLED_SYRINGE | INTRAMUSCULAR | Status: DC
  Administered 2022-03-13: 60 ug via SUBCUTANEOUS
  Filled 2022-03-13: qty 0.3

## 2022-03-13 MED ORDER — ISOSORBIDE MONONITRATE ER 30 MG PO TB24
15.0000 mg | ORAL_TABLET | Freq: Every day | ORAL | Status: DC
  Administered 2022-03-13 – 2022-03-18 (×6): 15 mg via ORAL
  Filled 2022-03-13 (×6): qty 1

## 2022-03-13 MED ORDER — HYDRALAZINE HCL 25 MG PO TABS
25.0000 mg | ORAL_TABLET | Freq: Three times a day (TID) | ORAL | Status: DC
  Administered 2022-03-13: 25 mg via ORAL
  Filled 2022-03-13: qty 1

## 2022-03-13 MED ORDER — POLYETHYLENE GLYCOL 3350 17 G PO PACK
17.0000 g | PACK | Freq: Every day | ORAL | Status: DC
  Administered 2022-03-13 – 2022-03-14 (×2): 17 g via ORAL
  Filled 2022-03-13 (×2): qty 1

## 2022-03-13 MED ORDER — CHLORHEXIDINE GLUCONATE CLOTH 2 % EX PADS
6.0000 | MEDICATED_PAD | Freq: Once | CUTANEOUS | Status: AC
Start: 2022-03-13 — End: 2022-03-13
  Administered 2022-03-13: 6 via TOPICAL

## 2022-03-13 NOTE — Progress Notes (Signed)
Subjective:  making urine-  at least 800 ccs-  kidney function slightly worsened-  not sure timing of HD cath  Objective Vital signs in last 24 hours: Vitals:   03/12/22 2036 03/12/22 2146 03/13/22 0428 03/13/22 0857  BP: 132/70 138/76 125/69 109/64  Pulse: 70 74 73 74  Resp: 16  16   Temp: 97.6 F (36.4 C)  98.6 F (37 C)   TempSrc: Oral  Oral   SpO2:      Weight:   127.4 kg   Height:   '5\' 7"'$  (1.702 m)    Weight change: -8.5 kg  Intake/Output Summary (Last 24 hours) at 03/13/2022 6578 Last data filed at 03/13/2022 4696 Gross per 24 hour  Intake 180 ml  Output 800 ml  Net -620 ml    Assessment/ Plan: Pt is a 66 y.o. 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 Now likely ESRD at this point. -ineffective diuresis with recurrent admissions for hypervolemia and now with uremic symptoms, Dr. Candiss Norse had a lengthy discussion in regards to renal replacement therapy. Discussed rationale and process. Likely will be long-term HD. Patient agrees with proceeding forward, discussed w/ daughter as well who also agrees -consulting IR for tunneled dialysis catheter. NPO after midnight. I'm okay with TDC being performed on Tuesday (IR will likely not be able to get to her tomorrow given their schedule)-  plan for first HD tomorrow -UA  bland- renal u/s pretty negative -diuretic challenge today: will give her a dose of lasix and metolazone today-  made urine but I dont think need to repeat meds today -  she is comfortable  -Continue to monitor daily Cr, Dose meds for GFR<15 Will look to get AV access placed while here as well but let her get a treatment or 2 under her belt first    Acute on chronic systolic and diastolic CHF -Diuresis plan as above.   Dialysis/diuretic challenge plan as above  Chest pain -Per cardiology and primary  Paroxysmal A-fib -On Amio and Eliquis   Anemia due to chronic  disease: -Transfuse for Hgb<7 g/dL -Hemoglobin 10.7 on 7/19, will check -  sat 27 with ferritin 118-  will give iron and ESA    [name]    Labs: Basic Metabolic Panel: Recent Labs  Lab 03/11/22 0158 03/12/22 0758 03/13/22 0652  NA 137 139 138  K 3.1* 3.9 3.7  CL 101 104 104  CO2 '27 26 23  '$ GLUCOSE 100* 109* 98  BUN 43* 58* 67*  CREATININE 4.77* 6.41* 6.78*  CALCIUM 8.8* 9.1 8.9  PHOS  --   --  6.1*   Liver Function Tests: Recent Labs  Lab 03/12/22 0758 03/13/22 0652  AST 24  --   ALT 21  --   ALKPHOS 122  --   BILITOT 0.6  --   PROT 7.3  --   ALBUMIN 3.2* 3.1*   No results for input(s): "LIPASE", "AMYLASE" in the last 168 hours. No results for input(s): "AMMONIA" in the last 168 hours. CBC: Recent Labs  Lab 03/08/22 1503 03/13/22 0652  WBC 4.5 4.9  HGB 10.7* 9.5*  HCT 34.6* 29.9*  MCV 84.0 81.9  PLT 184 153   Cardiac Enzymes: No results for input(s): "CKTOTAL", "CKMB", "CKMBINDEX", "TROPONINI" in the last 168 hours. CBG: Recent Labs  Lab 03/12/22 0748 03/12/22 1119 03/12/22 1652 03/12/22 2132 03/13/22 0757  GLUCAP  101* 116* 115* 118* 102*    Iron Studies:  Recent Labs    03/13/22 0652  IRON 63  TIBC 231*  FERRITIN 118   Studies/Results: US RENAL  Result Date: 03/12/2022 CLINICAL DATA:  Acute kidney injury. EXAM: RENAL / URINARY TRACT ULTRASOUND COMPLETE COMPARISON:  CT AP 03/08/2022 FINDINGS: Right Kidney: Renal measurements: 9.8 x 5.3 by 5.7 cm = volume: 154.7 mL. Echogenicity within normal limits. No mass or hydronephrosis visualized. Left Kidney: Renal measurements: 10.0 x 5.5 x 5.4 cm = volume: 153.0 mL. Echogenicity within normal limits. No mass or hydronephrosis visualized. Bladder: Appears normal for degree of bladder distention. Other: None. IMPRESSION: Normal renal sonogram. Electronically Signed   By: Kerby Moors M.D.   On: 03/12/2022 12:52   Medications: Infusions:  famotidine (PEPCID) IV 10 mg (03/12/22 2159)    heparin 1,300 Units/hr (03/12/22 2209)    Scheduled Medications:  amiodarone  200 mg Oral Daily   hydrALAZINE  25 mg Oral Q8H   insulin aspart  0-5 Units Subcutaneous QHS   insulin aspart  0-6 Units Subcutaneous TID WC   isosorbide mononitrate  15 mg Oral Daily   metoprolol succinate  25 mg Oral BID   polyethylene glycol  17 g Oral Daily   rosuvastatin  10 mg Oral Daily    have reviewed scheduled and prn medications.  Physical Exam: General:  really NAD Heart: RRR Lungs: mostly clear Abdomen: obese, soft, non tender Extremities: really no edema  Dialysis Access: none yet     03/13/2022,9:53 AM  LOS: 3 days

## 2022-03-13 NOTE — Progress Notes (Signed)
Mobility Specialist Progress Note    03/13/22 1500  Mobility  Activity Ambulated with assistance in hallway  Level of Assistance Contact guard assist, steadying assist  Assistive Device Four wheel walker  Distance Ambulated (ft) 260 ft (90+170)  Activity Response Tolerated well  $Mobility charge 1 Mobility   Pt received in BR and agreeable. No complaints. Took x1 seated rest break. Returned to bed with call bell in reach and NT present.   Hildred Alamin Mobility Specialist

## 2022-03-13 NOTE — Progress Notes (Signed)
Cardiology Progress Note  Patient ID: [name] MRN: [id number] DOB: 01-06-1956 Date of Encounter: 03/13/2022  Primary Cardiologist: Buford Dresser, MD  Subjective   Chief Complaint: None.   HPI: Plans for dialysis catheter today.  Renal following kidney function.  Volume status is improved.  ROS:  All other ROS reviewed and negative. Pertinent positives noted in the HPI.     Inpatient Medications  Scheduled Meds:  amiodarone  200 mg Oral Daily   insulin aspart  0-5 Units Subcutaneous QHS   insulin aspart  0-6 Units Subcutaneous TID WC   metoprolol succinate  25 mg Oral BID   rosuvastatin  10 mg Oral Daily   Continuous Infusions:  famotidine (PEPCID) IV 10 mg (03/12/22 2159)   heparin 1,300 Units/hr (03/12/22 2209)   PRN Meds: acetaminophen **OR** acetaminophen, docusate sodium, fentaNYL (SUBLIMAZE) injection, nitroGLYCERIN, ondansetron (ZOFRAN) IV   Vital Signs   Vitals:   03/12/22 1213 03/12/22 2036 03/12/22 2146 03/13/22 0428  BP: 139/70 132/70 138/76 125/69  Pulse: 78 70 74 73  Resp: '18 16  16  '$ Temp: 98.3 F (36.8 C) 97.6 F (36.4 C)  98.6 F (37 C)  TempSrc: Axillary Oral  Oral  SpO2: 99%     Weight:    127.4 kg  Height:        Intake/Output Summary (Last 24 hours) at 03/13/2022 0839 Last data filed at 03/13/2022 5397 Gross per 24 hour  Intake 180 ml  Output 800 ml  Net -620 ml      03/13/2022    4:28 AM 03/12/2022    7:50 AM 03/12/2022    5:19 AM  Last 3 Weights  Weight (lbs) 280 lb 13.9 oz 277 lb 9 oz 296 lb 4.8 oz  Weight (kg) 127.4 kg 125.9 kg 134.4 kg      Telemetry  Overnight telemetry shows sinus rhythm in the 70s, which I personally reviewed.   ECG  The most recent ECG shows sinus rhythm, first-degree block, and per infarct, PVC, which I personally reviewed.   Physical Exam   Vitals:   03/12/22 1213 03/12/22 2036 03/12/22 2146 03/13/22 0428  BP: 139/70 132/70 138/76 125/69  Pulse: 78 70 74 73  Resp: '18 16  16  '$ Temp:  98.3 F (36.8 C) 97.6 F (36.4 C)  98.6 F (37 C)  TempSrc: Axillary Oral  Oral  SpO2: 99%     Weight:    127.4 kg  Height:        Intake/Output Summary (Last 24 hours) at 03/13/2022 0839 Last data filed at 03/13/2022 6734 Gross per 24 hour  Intake 180 ml  Output 800 ml  Net -620 ml       03/13/2022    4:28 AM 03/12/2022    7:50 AM 03/12/2022    5:19 AM  Last 3 Weights  Weight (lbs) 280 lb 13.9 oz 277 lb 9 oz 296 lb 4.8 oz  Weight (kg) 127.4 kg 125.9 kg 134.4 kg    Body mass index is 43.99 kg/m.  General: Well nourished, well developed, in no acute distress Head: Atraumatic, normal size  Eyes: PEERLA, EOMI  Neck: Supple, JVD 10-12 cmH2O Endocrine: No thryomegaly Cardiac: Normal S1, S2; RRR; no murmurs, rubs, or gallops Lungs: Clear to auscultation bilaterally, no wheezing, rhonchi or rales  Abd: Soft, nontender, no hepatomegaly  Ext: No edema, pulses 2+ Musculoskeletal: No deformities, BUE and BLE strength normal and equal Skin: Warm and dry, no rashes   Neuro: Alert and oriented  to person, place, time, and situation, CNII-XII grossly intact, no focal deficits  Psych: Normal mood and affect   Labs  High Sensitivity Troponin:   Recent Labs  Lab 02/12/22 1226 02/12/22 1457 03/08/22 1503 03/08/22 1712  TROPONINIHS '7 6 5 5     '$ Cardiac EnzymesNo results for input(s): "TROPONINI" in the last 168 hours. No results for input(s): "TROPIPOC" in the last 168 hours.  Chemistry Recent Labs  Lab 03/11/22 0158 03/12/22 0758 03/13/22 0652  NA 137 139 138  K 3.1* 3.9 3.7  CL 101 104 104  CO2 '27 26 23  '$ GLUCOSE 100* 109* 98  BUN 43* 58* 67*  CREATININE 4.77* 6.41* 6.78*  CALCIUM 8.8* 9.1 8.9  PROT  --  7.3  --   ALBUMIN  --  3.2* 3.1*  AST  --  24  --   ALT  --  21  --   ALKPHOS  --  122  --   BILITOT  --  0.6  --   GFRNONAA 10* 7* 6*  ANIONGAP '9 9 11    '$ Hematology Recent Labs  Lab 03/08/22 1503 03/13/22 0652  WBC 4.5 4.9  RBC 4.12 3.65*  HGB 10.7* 9.5*  HCT  34.6* 29.9*  MCV 84.0 81.9  MCH 26.0 26.0  MCHC 30.9 31.8  RDW 18.0* 18.6*  PLT 184 153   BNP Recent Labs  Lab 03/08/22 1506  BNP 205.3*    DDimer  Recent Labs  Lab 03/08/22 1501  DDIMER 0.70*     Radiology  US RENAL  Result Date: 03/12/2022 CLINICAL DATA:  Acute kidney injury. EXAM: RENAL / URINARY TRACT ULTRASOUND COMPLETE COMPARISON:  CT AP 03/08/2022 FINDINGS: Right Kidney: Renal measurements: 9.8 x 5.3 by 5.7 cm = volume: 154.7 mL. Echogenicity within normal limits. No mass or hydronephrosis visualized. Left Kidney: Renal measurements: 10.0 x 5.5 x 5.4 cm = volume: 153.0 mL. Echogenicity within normal limits. No mass or hydronephrosis visualized. Bladder: Appears normal for degree of bladder distention. Other: None. IMPRESSION: Normal renal sonogram. Electronically Signed   By: Kerby Moors M.D.   On: 03/12/2022 12:52    Cardiac Studies  TTE 11/22/2021    1. Left ventricular ejection fraction, by estimation, is 35 to 40%. The  left ventricle has moderately decreased function. The left ventricle  demonstrates global hypokinesis. Left ventricular diastolic function could  not be evaluated. There is the  interventricular septum is flattened in systole and diastole, consistent  with right ventricular pressure and volume overload.   2. Right ventricular systolic function is moderately reduced. The right  ventricular size is moderately enlarged. There is moderately elevated  pulmonary artery systolic pressure. The estimated right ventricular  systolic pressure is 26.8 mmHg.   3. Left atrial size was severely dilated.   4. Right atrial size was severely dilated.   5. The mitral valve is myxomatous. Trivial mitral valve regurgitation.  Mild mitral stenosis. The mean mitral valve gradient is 8.2 mmHg with  average heart rate of 100 bpm. There is a 30 mm CARBOMEDICS ANNULOFLEX  RING prosthetic annuloplasty ring present   in the mitral position. Procedure Date: 12/29/20.   6.  Tricuspid annuloplasty repair. The tricuspid valve is has been  repaired/replaced. Tricuspid valve regurgitation is severe.   7. The aortic valve is tricuspid. Aortic valve regurgitation is not  visualized. No aortic stenosis is present.   8. The inferior vena cava is dilated in size with >50% respiratory  variability, suggesting right atrial pressure  of 8 mmHg.   Patient Profile  [name] is a 66 y.o. female with systolic heart failure (EF 35-40%), CKD stage V, mitral valve repair/tricuspid valve repair, diabetes, COPD, paroxysmal atrial fibrillation who was admitted on 03/09/2022 with chest pain and acute on chronic systolic heart failure.  Course has been complicated by AKI.  Assessment & Plan   #Acute on chronic systolic heart failure, EF 35-40% #Cor pulmonale #Pulmonary hypertension -Admitted with chest pain symptoms and volume overload.  Has suffered an AKI and likely require dialysis.  Renal is helping to manage her diuretics.  Looks like she will need a tunneled dialysis catheter. -We will recheck limited echo.  Echo in April showed systolic dysfunction 35 to 40%.  Recent TEE with EF normal.  We will continue metoprolol succinate 25 mg twice daily.  Add Imdur 15 mg daily.  Add hydralazine 25 mg 3 times daily. -Chest pain symptoms are likely related to volume overload.  Left heart catheterization with minimal CAD.  #AKI/CKD V -per nephrology   #pAF -Eliquis on hold due to need for dialysis catheter.  On heparin drip in the interim. -Continue amiodarone.  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  Manage this conservatively with volume removal.     For questions or updates, please contact [place] Please consult [email or web address] for contact info under   Signed, [place] Bells T. Audie Box, MD, [place]  03/13/2022 8:39 AM

## 2022-03-13 NOTE — Progress Notes (Addendum)
ANTICOAGULATION CONSULT NOTE - Follow-up Consult  Pharmacy Consult for heparin Indication: atrial fibrillation  Allergies  Allergen Reactions   Bee Pollen Anaphylaxis and Other (See Comments)    UNCONFIRMED, but IS allergic to bee VENOM   Bee Venom Anaphylaxis   Sulfa Antibiotics Itching    Patient Measurements: Height: '5\' 7"'$  (170.2 cm) Weight: 127.4 kg (280 lb 13.9 oz) IBW/kg (Calculated) : 61.6 Heparin Dosing Weight: 92 kg  Vital Signs: Temp: 98.6 F (37 C) (07/24 0428) Temp Source: Oral (07/24 0428) BP: 125/69 (07/24 0428) Pulse Rate: 73 (07/24 0428)  Labs: Recent Labs    03/11/22 0158 03/12/22 0758 03/13/22 0652  HGB  --   --  9.5*  HCT  --   --  29.9*  PLT  --   --  153  HEPARINUNFRC  --   --  >1.10*  CREATININE 4.77* 6.41*  --     Estimated Creatinine Clearance: 12.1 mL/min (A) (by C-G formula based on SCr of 6.41 mg/dL (H)).   Medical History: Past Medical History:  Diagnosis Date   Acute on chronic systolic CHF (congestive heart failure) (HCC) 12/21/2020   Allergic rhinitis    Arthritis    Asthma    Chronic diastolic CHF (congestive heart failure) (HCC)    COPD (chronic obstructive pulmonary disease) (HCC)    Depression    DM (diabetes mellitus) ([place])    DVT (deep vein thrombosis) in pregnancy    GERD (gastroesophageal reflux disease)    HTN (hypertension)    Hyperlipidemia    Obesity    Pneumonia 04/2018   RIGHT LOBE   Sleep apnea    compliant with CPAP    Medications:  Medications Prior to Admission  Medication Sig Dispense Refill Last Dose   acetaminophen (TYLENOL) 325 MG tablet Take 2 tablets (650 mg total) by mouth every 6 (six) hours as needed for mild pain or moderate pain.   unk   albuterol (VENTOLIN HFA) 108 (90 Base) MCG/ACT inhaler INHALE 1-2 PUFFS BY MOUTH EVERY 6 HOURS AS NEEDED FOR WHEEZE OR SHORTNESS OF BREATH (Patient taking differently: Inhale 2 puffs into the lungs every 6 (six) hours as needed for wheezing or shortness of  breath.) 18 g 1 Past Week   amiodarone (PACERONE) 200 MG tablet Take 1 tablet (200 mg total) by mouth daily. (Patient taking differently: Take 200 mg by mouth 2 (two) times daily.) 90 tablet 1 03/08/2022 at 1000   apixaban (ELIQUIS) 5 MG TABS tablet TAKE ONE TABLET BY MOUTH TWICE DAILY at [place] (Patient taking differently: Take 5 mg by mouth in the morning and at bedtime.) 60 tablet 1 03/08/2022 at 1000   busPIRone (BUSPAR) 5 MG tablet TAKE 1 TABLET BY MOUTH TWICE A DAY (Patient taking differently: Take 5 mg by mouth 2 (two) times daily.) 180 tablet 1 03/08/2022   colchicine 0.6 MG tablet Take 0.5 tablets (0.3 mg total) by mouth daily. 45 tablet 0 03/08/2022   diphenhydrAMINE (BENADRYL) 25 mg capsule Take 25 mg by mouth every 6 (six) hours as needed for allergies or itching.   unk   DULoxetine (CYMBALTA) 60 MG capsule TAKE 1 CAPSULE BY MOUTH EVERY DAY (Patient taking differently: Take 60 mg by mouth daily.) 90 capsule 3 03/08/2022   EPINEPHrine 0.3 mg/0.3 mL IJ SOAJ injection Inject 0.3 mg into the muscle as needed for anaphylaxis. 1 each 1 unk   fluticasone (FLONASE) 50 MCG/ACT nasal spray Place 2 sprays into both nostrils daily. (Patient taking differently:  Place 2 sprays into both nostrils daily as needed for allergies or rhinitis.) 16 g 6 unk   insulin aspart (NOVOLOG) 100 UNIT/ML injection Inject 2-10 Units into the skin 3 (three) times daily before meals. Per sliding scale (Patient taking differently: Inject 2-10 Units into the skin See admin instructions. Inject 2-10 units into the skin three times a day before meals, per sliding scale)   03/08/2022 at am   lidocaine (XYLOCAINE) 5 % ointment Apply 1 application. topically as needed for moderate pain. First choice is lidocaine patch, but if not covered/expensive, please fill ointment (Patient taking differently: Apply 1 application  topically 2 (two) times daily as needed for moderate pain.) 35.44 g 3 unk   LINZESS 72 MCG capsule Take 72 mcg by mouth  every morning.   03/08/2022 at am   loratadine (CLARITIN) 10 MG tablet Take 1 tablet (10 mg total) by mouth daily. 30 tablet 11 03/08/2022 at am   metolazone (ZAROXOLYN) 2.5 MG tablet Take 1 tablet (2.5 mg total) by mouth once a week. Monday.  Take extra tablet as needed for weight gain or lower extremity edema. (Patient taking differently: Take 2.5 mg by mouth See admin instructions. Take 2.5 mg by mouth on Mondays and Wednesdays) 8 tablet 11 03/08/2022   metoprolol succinate (TOPROL-XL) 25 MG 24 hr tablet TAKE 1 TABLET (25 MG) BY MOUTH IN THE MORNING AND AT BEDTIME (Patient taking differently: Take 25 mg by mouth in the morning and at bedtime.) 180 tablet 1 03/08/2022 at 1000   Multiple Vitamins-Minerals (MULTIVITAMIN WOMEN PO) Take 1 tablet by mouth daily.   03/08/2022   nitroGLYCERIN (NITROSTAT) 0.4 MG SL tablet Place 1 tablet (0.4 mg total) under the tongue every 5 (five) minutes x 3 doses as needed for chest pain. 25 tablet 12 unk   pantoprazole (PROTONIX) 40 MG tablet TAKE ONE TABLET BY MOUTH DAILY AT 9AM (Patient taking differently: Take 40 mg by mouth daily before breakfast.) 90 tablet 0 03/08/2022 at am   polyethylene glycol powder (GLYCOLAX/MIRALAX) 17 GM/SCOOP powder Take 17 g by mouth daily. (Patient taking differently: Take 17 g by mouth daily as needed for mild constipation.) 238 g 0 unk   potassium chloride SA (KLOR-CON M) 20 MEQ tablet Take 3 tablets (60 mEq total) by mouth daily. Take an additional 20 meq with every dose of METOLAZONE (Patient taking differently: Take 40 mEq by mouth 3 (three) times daily.) 180 tablet 3 03/08/2022   pregabalin (LYRICA) 100 MG capsule TAKE 1 CAPSULE BY MOUTH THREE TIMES A DAY (Patient taking differently: Take 100 mg by mouth 3 (three) times daily as needed (for pain).) 90 capsule 3 unk   RESTASIS 0.05 % ophthalmic emulsion Place 1 drop into both eyes in the morning and at bedtime.  12 03/08/2022 at am   rosuvastatin (CRESTOR) 10 MG tablet Take 1 tablet (10 mg  total) by mouth every evening. (Patient taking differently: Take 10 mg by mouth daily.)   03/08/2022 at am   senna-docusate (SENOKOT-S) 8.6-50 MG tablet Take 1 tablet by mouth 2 (two) times daily. 14 tablet 0 03/08/2022 at am   torsemide (DEMADEX) 20 MG tablet Take 4 tablets (80 mg total) by mouth daily. Can take an additional 2 tablets as needed for increased weight gain (Patient taking differently: Take 60 mg by mouth in the morning.) 360 tablet 1 1/91/4782 at am   TRULICITY 1.5 NF/6.2ZH SOPN INJECT 1.5 MG INTO THE SKIN ONCE A WEEK. (Patient taking differently: Inject 1.5 mg  into the skin every Wednesday.) 0.5 mL 2 03/08/2022    Assessment: [name] on apixaban for PAF. Pt with AKI on CKD4 and will need long-term HD. Plan for dialysis catheter placement either Monday or Tuesday. To transition from apixaban to heparin for procedures. Last dose of apixaban 7/23 1000. Apixaban will be affecting heparin levels so will utilize aPTT for monitoring until levels are correlating.  Heparin currently running at 1300 units/hr. HL >1.10 is supratherapeutic with aPTT above goal at 104. Heparin drawn appropriately from opposite arm. Hgb 9.5 trending down. No s/sx of bleeding when communicating with RN and patient.     Goal of Therapy:  Heparin level 0.3-0.7 units/ml, aPTT 66-102 sec Monitor platelets by anticoagulation protocol: Yes   Plan:  Decrease heparin infusion 1150 units/hr  Will f/u aPTT in 8 hours Monitor daily heparin level, aPTT, and CBC Continue to monitor H&H    Gena Fray, PharmD PGY1 Pharmacy Resident   03/13/2022 9:41 AM

## 2022-03-13 NOTE — Telephone Encounter (Signed)
Sent to PCP ?

## 2022-03-13 NOTE — Consult Note (Signed)
Chief Complaint: Patient was seen in consultation today for AKI on CKD4 at the request of Nephrology  Referring Physician(s): Dr. Gean Quint, MD  Supervising Physician: Aletta Edouard  Patient Status: [facility] - In-pt  History of Present Illness: [name] is a 66 y.o. 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  She was having dizziness and nausea.  She has noted bilateral lower extremity edema and 8 lb weight gain over past 2 weeks, despite routine diuretics.  She was admitted with heart failure exacerbation and atypical chest pain.  UOP noted to be low despite high dose IV lasix and worsening renal function found.  Dialysis now needed.  IR consulted for [facility].  She is resting in bed with older sister at bedside and daughter on speaker phone   Past Medical History:  Diagnosis Date   Acute on chronic systolic CHF (congestive heart failure) ([place]) 12/21/2020   Allergic rhinitis    Arthritis    Asthma    Chronic diastolic CHF (congestive heart failure) (HCC)    COPD (chronic obstructive pulmonary disease) (HCC)    Depression    DM (diabetes mellitus) ([place])    DVT (deep vein thrombosis) in pregnancy    GERD (gastroesophageal reflux disease)    HTN (hypertension)    Hyperlipidemia    Obesity    Pneumonia 04/2018   RIGHT LOBE   Sleep apnea    compliant with CPAP    Past Surgical History:  Procedure Laterality Date   ABDOMINAL HYSTERECTOMY     BUBBLE STUDY  11/26/2020   Procedure: BUBBLE STUDY;  Surgeon: Buford Dresser, MD;  Location: [place];  Service: Cardiovascular;;   CARDIOVERSION N/A 05/06/2020   Procedure: CARDIOVERSION;  Surgeon: Buford Dresser, MD;  Location: [facility] ENDOSCOPY;  Service: Cardiovascular;  Laterality: N/A;   CARDIOVERSION N/A  11/04/2020   Procedure: CARDIOVERSION;  Surgeon: Lelon Perla, MD;  Location: [place];  Service: Cardiovascular;  Laterality: N/A;   CARDIOVERSION N/A 02/01/2021   Procedure: CARDIOVERSION;  Surgeon: Jolaine Artist, MD;  Location: [place];  Service: Cardiovascular;  Laterality: N/A;   CARDIOVERSION N/A 02/09/2021   Procedure: CARDIOVERSION;  Surgeon: Jolaine Artist, MD;  Location: [place];  Service: Cardiovascular;  Laterality: N/A;   CARDIOVERSION N/A 04/19/2021   Procedure: CARDIOVERSION;  Surgeon: Fay Records, MD;  Location: [place];  Service: Cardiovascular;  Laterality: N/A;   CARDIOVERSION N/A 11/25/2021   Procedure: CARDIOVERSION;  Surgeon: Jolaine Artist, MD;  Location: [place];  Service: Cardiovascular;  Laterality: N/A;   CARDIOVERSION N/A 01/31/2022   Procedure: CARDIOVERSION;  Surgeon: Jolaine Artist, MD;  Location: [place];  Service: Cardiovascular;  Laterality: N/A;   CATARACT EXTRACTION, BILATERAL     CHOLECYSTECTOMY     COLONOSCOPY WITH PROPOFOL N/A 03/05/2015   Procedure: COLONOSCOPY WITH PROPOFOL;  Surgeon: Carol Ada, MD;  Location: [facility] ENDOSCOPY;  Service: Endoscopy;  Laterality: N/A;   DIAGNOSTIC LAPAROSCOPY     ingrown hallux Left    KNEE SURGERY     LEFT HEART CATH AND CORONARY ANGIOGRAPHY N/A 12/31/2017   Procedure: LEFT HEART CATH AND CORONARY ANGIOGRAPHY;  Surgeon: Charolette Forward, MD;  Location: [place] CV LAB;  Service: Cardiovascular;  Laterality: N/A;   MAZE N/A 12/29/2020   Procedure: MAZE;  Surgeon: [name],  MD;  Location: [facility] OR;  Service: Open Heart Surgery;  Laterality: N/A;   MITRAL VALVE REPAIR N/A 12/29/2020   Procedure: MITRAL VALVE REPAIR USING CARBOMEDICS ANNULOFLEX RING SIZE 30;  Surgeon: [name], MD;  Location: [place];  Service: Open Heart Surgery;  Laterality: N/A;   RIGHT HEART CATH N/A 12/22/2020   Procedure: RIGHT HEART CATH;  Surgeon: Larey Dresser, MD;  Location: [place] CV LAB;  Service: Cardiovascular;  Laterality: N/A;   RIGHT HEART CATH N/A 01/31/2021   Procedure: RIGHT HEART CATH;  Surgeon: Jolaine Artist, MD;  Location: [place] CV LAB;  Service: Cardiovascular;  Laterality: N/A;   RIGHT HEART CATH N/A 07/29/2021   Procedure: RIGHT HEART CATH;  Surgeon: Jolaine Artist, MD;  Location: [place] CV LAB;  Service: Cardiovascular;  Laterality: N/A;   RIGHT HEART CATH N/A 11/24/2021   Procedure: RIGHT HEART CATH;  Surgeon: Jolaine Artist, MD;  Location: [place] CV LAB;  Service: Cardiovascular;  Laterality: N/A;   RIGHT HEART CATH N/A 02/14/2022   Procedure: RIGHT HEART CATH;  Surgeon: Jolaine Artist, MD;  Location: [place] CV LAB;  Service: Cardiovascular;  Laterality: N/A;   RIGHT HEART CATH N/A 02/22/2022   Procedure: RIGHT HEART CATH;  Surgeon: Larey Dresser, MD;  Location: [place] CV LAB;  Service: Cardiovascular;  Laterality: N/A;   TEE WITHOUT CARDIOVERSION N/A 11/26/2020   Procedure: TRANSESOPHAGEAL ECHOCARDIOGRAM (TEE);  Surgeon: Buford Dresser, MD;  Location: [facility] ENDOSCOPY;  Service: Cardiovascular;  Laterality: N/A;   TEE WITHOUT CARDIOVERSION N/A 12/29/2020   Procedure: TRANSESOPHAGEAL ECHOCARDIOGRAM (TEE);  Surgeon: [name], MD;  Location: [place];  Service: Open Heart Surgery;  Laterality: N/A;   TEE WITHOUT CARDIOVERSION N/A 01/20/2022   Procedure: TRANSESOPHAGEAL ECHOCARDIOGRAM (TEE);  Surgeon: Jolaine Artist, MD;  Location: [place];  Service: Cardiovascular;  Laterality: N/A;   TRICUSPID VALVE REPLACEMENT N/A 12/29/2020   Procedure: 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 RING SIZE 34;  Surgeon: [name], MD;  Location: [place];  Service: Open Heart Surgery;  Laterality: N/A;    Allergies: Bee pollen, Bee venom, and Sulfa antibiotics  Medications: Prior to Admission medications   Medication Sig Start Date End Date Taking? Authorizing Provider   acetaminophen (TYLENOL) 325 MG tablet Take 2 tablets (650 mg total) by mouth every 6 (six) hours as needed for mild pain or moderate pain. 12/07/21  Yes Arrien, Jimmy Picket, MD  albuterol (VENTOLIN HFA) 108 (90 Base) MCG/ACT inhaler INHALE 1-2 PUFFS BY MOUTH EVERY 6 HOURS AS NEEDED FOR WHEEZE OR SHORTNESS OF BREATH Patient taking differently: Inhale 2 puffs into the lungs every 6 (six) hours as needed for wheezing or shortness of breath. 02/24/22  Yes Shelly Coss, MD  amiodarone (PACERONE) 200 MG tablet Take 1 tablet (200 mg total) by mouth daily. Patient taking differently: Take 200 mg by mouth 2 (two) times daily. 03/01/22  Yes Bensimhon, Shaune Pascal, MD  apixaban (ELIQUIS) 5 MG TABS tablet TAKE ONE TABLET BY MOUTH TWICE DAILY at [place] Patient taking differently: Take 5 mg by mouth in the morning and at bedtime. 02/24/22  Yes Adhikari, Tamsen Meek, MD  busPIRone (BUSPAR) 5 MG tablet TAKE 1 TABLET BY MOUTH TWICE A DAY Patient taking differently: Take 5 mg by mouth 2 (two) times daily. 02/26/22  Yes [name], NP  colchicine 0.6 MG tablet Take 0.5 tablets (0.3 mg total) by mouth daily. 12/08/21  Yes Newlin,  Charlane Ferretti, MD  diphenhydrAMINE (BENADRYL) 25 mg capsule Take 25 mg by mouth every 6 (six) hours as needed for allergies or itching.   Yes [provider]  DULoxetine (CYMBALTA) 60 MG capsule TAKE 1 CAPSULE BY MOUTH EVERY DAY Patient taking differently: Take 60 mg by mouth daily. 02/26/22  Yes [name], NP  EPINEPHrine 0.3 mg/0.3 mL IJ SOAJ injection Inject 0.3 mg into the muscle as needed for anaphylaxis. 06/25/20  Yes [name], NP  fluticasone (FLONASE) 50 MCG/ACT nasal spray Place 2 sprays into both nostrils daily. Patient taking differently: Place 2 sprays into both nostrils daily as needed for allergies or rhinitis. 12/29/21  Yes [name], NP  insulin aspart (NOVOLOG) 100 UNIT/ML injection Inject 2-10 Units into the skin 3 (three) times daily before  meals. Per sliding scale Patient taking differently: Inject 2-10 Units into the skin See admin instructions. Inject 2-10 units into the skin three times a day before meals, per sliding scale 09/02/21  Yes Alekh, Kshitiz, MD  lidocaine (XYLOCAINE) 5 % ointment Apply 1 application. topically as needed for moderate pain. First choice is lidocaine patch, but if not covered/expensive, please fill ointment Patient taking differently: Apply 1 application  topically 2 (two) times daily as needed for moderate pain. 11/08/21  Yes Buford Dresser, MD  LINZESS 72 MCG capsule Take 72 mcg by mouth every morning. 02/11/22  Yes [provider]  loratadine (CLARITIN) 10 MG tablet Take 1 tablet (10 mg total) by mouth daily. 12/29/21  Yes [name], NP  metolazone (ZAROXOLYN) 2.5 MG tablet Take 1 tablet (2.5 mg total) by mouth once a week. Monday.  Take extra tablet as needed for weight gain or lower extremity edema. Patient taking differently: Take 2.5 mg by mouth See admin instructions. Take 2.5 mg by mouth on Mondays and Wednesdays 02/24/22  Yes Adhikari, Tamsen Meek, MD  metoprolol succinate (TOPROL-XL) 25 MG 24 hr tablet TAKE 1 TABLET (25 MG) BY MOUTH IN THE MORNING AND AT BEDTIME Patient taking differently: Take 25 mg by mouth in the morning and at bedtime. 02/28/22  Yes Bensimhon, Shaune Pascal, MD  Multiple Vitamins-Minerals (MULTIVITAMIN WOMEN PO) Take 1 tablet by mouth daily.   Yes [provider]  nitroGLYCERIN (NITROSTAT) 0.4 MG SL tablet Place 1 tablet (0.4 mg total) under the tongue every 5 (five) minutes x 3 doses as needed for chest pain. 01/08/14  Yes Charolette Forward, MD  pantoprazole (PROTONIX) 40 MG tablet TAKE ONE TABLET BY MOUTH DAILY AT 9AM Patient taking differently: Take 40 mg by mouth daily before breakfast. 01/20/22  Yes [name], NP  polyethylene glycol powder (GLYCOLAX/MIRALAX) 17 GM/SCOOP powder Take 17 g by mouth daily. Patient taking differently: Take 17 g by mouth  daily as needed for mild constipation. 02/25/22  Yes Adhikari, Tamsen Meek, MD  potassium chloride SA (KLOR-CON M) 20 MEQ tablet Take 3 tablets (60 mEq total) by mouth daily. Take an additional 20 meq with every dose of METOLAZONE Patient taking differently: Take 40 mEq by mouth 3 (three) times daily. 01/27/22  Yes Joette Catching, PA-C  pregabalin (LYRICA) 100 MG capsule TAKE 1 CAPSULE BY MOUTH THREE TIMES A DAY Patient taking differently: Take 100 mg by mouth 3 (three) times daily as needed (for pain). 02/26/22  Yes [name], NP  RESTASIS 0.05 % ophthalmic emulsion Place 1 drop into both eyes in the morning and at bedtime. 11/19/17  Yes [provider]  rosuvastatin (CRESTOR) 10 MG tablet  Take 1 tablet (10 mg total) by mouth every evening. Patient taking differently: Take 10 mg by mouth daily. 09/02/21  Yes Aline August, MD  senna-docusate (SENOKOT-S) 8.6-50 MG tablet Take 1 tablet by mouth 2 (two) times daily. 02/24/22  Yes Shelly Coss, MD  torsemide (DEMADEX) 20 MG tablet Take 4 tablets (80 mg total) by mouth daily. Can take an additional 2 tablets as needed for increased weight gain Patient taking differently: Take 60 mg by mouth in the morning. 02/28/22  Yes Larey Dresser, MD  TRULICITY 1.5 ZO/1.0RU SOPN INJECT 1.5 MG INTO THE SKIN ONCE A WEEK. Patient taking differently: Inject 1.5 mg into the skin every Wednesday. 02/26/22  Yes [name], NP     Family History  Problem Relation Age of Onset   Breast cancer Mother    Cancer Mother    Hypertension Mother    Cancer Father    Hypertension Sister    Hypertension Brother    CAD Other     Social History   Socioeconomic History   Marital status: Significant Other    Spouse name: Not on file   Number of children: 2   Years of education: Not on file   Highest education level: Some college, no degree  Occupational History   Not on file  Tobacco Use   Smoking status: Never   Smokeless tobacco: Never  Vaping  Use   Vaping Use: Never used  Substance and Sexual Activity   Alcohol use: No   Drug use: No   Sexual activity: Not Currently  Other Topics Concern   Not on file  Social History Narrative   Pt lives in [place] with spouse.  2 grown children.   Previously worked in [employer] at [employer].  Now on disability   Social Determinants of Health   Financial Resource Strain: Low Risk  (01/19/2022)   Overall Financial Resource Strain (CARDIA)    Difficulty of Paying Living Expenses: Not hard at all  Food Insecurity: No Food Insecurity (01/19/2022)   Hunger Vital Sign    Worried About Running Out of Food in the Last Year: Never true    Ran Out of Food in the Last Year: Never true  Transportation Needs: No Transportation Needs (01/19/2022)   PRAPARE - Hydrologist (Medical): No    Lack of Transportation (Non-Medical): No  Physical Activity: Insufficiently Active (10/28/2021)   Exercise Vital Sign    Days of Exercise per Week: 2 days    Minutes of Exercise per Session: 70 min  Stress: No Stress Concern Present (10/28/2021)   [place]    Feeling of Stress : Not at all  Social Connections: Socially Isolated (10/28/2021)   Social Connection and Isolation Panel [NHANES]    Frequency of Communication with Friends and Family: More than three times a week    Frequency of Social Gatherings with Friends and Family: Three times a week    Attends Religious Services: Never    Active Member of Clubs or Organizations: No    Attends Archivist Meetings: Never    Marital Status: Widowed   Review of Systems  Constitutional:  Positive for unexpected weight change.  HENT: Negative.    Eyes: Negative.   Respiratory: Negative.  Negative for cough and shortness of breath.   Cardiovascular:  Positive for leg swelling. Negative for chest pain.  Gastrointestinal:  Positive for constipation.  Endocrine:  Negative.  Genitourinary:  Positive for decreased urine volume.  Musculoskeletal: Negative.   Skin: Negative.   Allergic/Immunologic: Negative.   Neurological:  Positive for headaches.  Hematological: Negative.   Psychiatric/Behavioral: Negative.      Vital Signs: BP 109/64   Pulse 74   Temp 98.6 F (37 C) (Oral)   Resp 16   Ht '5\' 7"'$  (1.702 m)   Wt 280 lb 13.9 oz (127.4 kg)   SpO2 99%   BMI 43.99 kg/m   Physical Exam Constitutional:      General: She is not in acute distress.    Appearance: She is well-developed. She is not toxic-appearing.  HENT:     Head: Normocephalic and atraumatic.  Cardiovascular:     Rate and Rhythm: Normal rate and regular rhythm.     Heart sounds: Normal heart sounds.  Pulmonary:     Effort: Pulmonary effort is normal. No respiratory distress.     Breath sounds: Normal breath sounds.  Abdominal:     Palpations: Abdomen is soft.     Tenderness: There is no abdominal tenderness.  Musculoskeletal:     Right lower leg: Edema present.     Left lower leg: Edema present.  Skin:    General: Skin is warm and dry.  Neurological:     General: No focal deficit present.     Mental Status: She is alert and oriented to person, place, and time.  Psychiatric:        Mood and Affect: Mood normal.        Behavior: Behavior normal.     Imaging: ECHOCARDIOGRAM LIMITED  Result Date: 03/13/2022    ECHOCARDIOGRAM LIMITED REPORT   Patient Name:   REWA WEISSBERG Date of Exam: 03/13/2022 Medical Rec #:  [id number]      Height:       67.0 in Accession #:    [id number]     Weight:       280.9 lb Date of Birth:  November 10, 1955      BSA:          2.336 m Patient Age:    27 years       BP:           109/64 mmHg Patient Gender: F              HR:           75 bpm. Exam Location:  Inpatient Procedure: Limited Echo Indications:    Congestive heart failure  History:        Patient has prior history of Echocardiogram examinations, most                 recent 01/20/2022. CHF, COPD;  Risk Factors:Diabetes, Hypertension                 and Sleep Apnea.  Sonographer:    Jefferey Pica Referring Phys: [id number] [place]  1. Abnormal septal motion . Left ventricular ejection fraction, by estimation, is 50 to 55%. The left ventricle has low normal function. The left ventricle has no regional wall motion abnormalities.  2. Right ventricular systolic function is normal. The right ventricular size is normal.  3. Left atrial size was severely dilated.  4. Right atrial size was severely dilated.  5. Post repair with annuloplasty ring no doppler/color flow done. The mitral valve was not assessed. No evidence of mitral valve regurgitation. No evidence of mitral stenosis.  6. Post repair with ring no doppler/color flow  done. Tricuspid valve regurgitation is not demonstrated.  7. The aortic valve was not assessed. Aortic valve regurgitation is not visualized. No aortic stenosis is present.  8. The inferior vena cava is normal in size with greater than 50% respiratory variability, suggesting right atrial pressure of 3 mmHg. FINDINGS  Left Ventricle: Abnormal septal motion. Left ventricular ejection fraction, by estimation, is 50 to 55%. The left ventricle has low normal function. The left ventricle has no regional wall motion abnormalities. The left ventricular internal cavity size was normal in size. There is no left ventricular hypertrophy. Right Ventricle: The right ventricular size is normal. No increase in right ventricular wall thickness. Right ventricular systolic function is normal. Left Atrium: Left atrial size was severely dilated. Right Atrium: Right atrial size was severely dilated. Pericardium: There is no evidence of pericardial effusion. Mitral Valve: Post repair with annuloplasty ring no doppler/color flow done. The mitral valve was not assessed. No evidence of mitral valve stenosis. Tricuspid Valve: Post repair with ring no doppler/color flow done. The tricuspid valve  is not assessed. Tricuspid valve regurgitation is not demonstrated. No evidence of tricuspid stenosis. Aortic Valve: The aortic valve was not assessed. Aortic valve regurgitation is not visualized. No aortic stenosis is present. Pulmonic Valve: The pulmonic valve was not assessed. Pulmonic valve regurgitation is not visualized. No evidence of pulmonic stenosis. Aorta: The aortic root is normal in size and structure. Venous: The inferior vena cava is normal in size with greater than 50% respiratory variability, suggesting right atrial pressure of 3 mmHg. IAS/Shunts: No atrial level shunt detected by color flow Doppler. LEFT VENTRICLE PLAX 2D LVIDd:         5.35 cm LVIDs:         3.70 cm LV PW:         1.05 cm LV IVS:        0.80 cm  LV Volumes (MOD) LV vol d, MOD A4C: 144.0 ml LV vol s, MOD A4C: 70.3 ml LV SV MOD A4C:     144.0 ml IVC IVC diam: 2.30 cm LEFT ATRIUM              Index        RIGHT ATRIUM           Index LA diam:        5.60 cm  2.40 cm/m   RA Area:     31.60 cm LA Vol (A2C):   109.0 ml 46.66 ml/m  RA Volume:   129.00 ml 55.22 ml/m LA Vol (A4C):   99.5 ml  42.59 ml/m LA Biplane Vol: 105.0 ml 44.94 ml/m   AORTA Ao Root diam: 3.10 cm Ao Asc diam:  3.00 cm Jenkins Rouge MD Electronically signed by Jenkins Rouge MD Signature Date/Time: 03/13/2022/10:21:28 AM    Final    US RENAL  Result Date: 03/12/2022 CLINICAL DATA:  Acute kidney injury. EXAM: RENAL / URINARY TRACT ULTRASOUND COMPLETE COMPARISON:  CT AP 03/08/2022 FINDINGS: Right Kidney: Renal measurements: 9.8 x 5.3 by 5.7 cm = volume: 154.7 mL. Echogenicity within normal limits. No mass or hydronephrosis visualized. Left Kidney: Renal measurements: 10.0 x 5.5 x 5.4 cm = volume: 153.0 mL. Echogenicity within normal limits. No mass or hydronephrosis visualized. Bladder: Appears normal for degree of bladder distention. Other: None. IMPRESSION: Normal renal sonogram. Electronically Signed   By: Kerby Moors M.D.   On: 03/12/2022 12:52   VAS US  CAROTID  Result Date: 03/09/2022 Carotid Arterial Duplex Study Patient Name:  Alyssa Grove  Date of Exam:   03/09/2022 Medical Rec #: [id number]       Accession #:    [id number] Date of Birth: 08-Jul-1956       Patient Gender: F Patient Age:   27 years Exam [facility] Procedure:      VAS US CAROTID Referring Phys: Wandra Feinstein RATHORE --------------------------------------------------------------------------------  Risk Factors:      Hypertension, hyperlipidemia, Diabetes. Comparison Study:  12/24/2020 1-39% bilateral ICA stenosis Performing Technologist: Maudry Mayhew MHA, RDMS, RVT, RDCS  Examination Guidelines: A complete evaluation includes B-mode imaging, spectral Doppler, color Doppler, and power Doppler as needed of all accessible portions of each vessel. Bilateral testing is considered an integral part of a complete examination. Limited examinations for reoccurring indications may be performed as noted.  Right Carotid Findings: +----------+--------+--------+--------+--------------------------+--------+           PSV cm/sEDV cm/sStenosisPlaque Description        Comments +----------+--------+--------+--------+--------------------------+--------+ CCA Prox  120     19                                                 +----------+--------+--------+--------+--------------------------+--------+ CCA Distal84      16                                                 +----------+--------+--------+--------+--------------------------+--------+ ICA Prox  65      19              irregular and heterogenous         +----------+--------+--------+--------+--------------------------+--------+ ICA Distal78      29                                                 +----------+--------+--------+--------+--------------------------+--------+ ECA       87                                                          +----------+--------+--------+--------+--------------------------+--------+ +----------+--------+-------+----------------+-------------------+           PSV cm/sEDV cmsDescribe        Arm Pressure (mmHG) +----------+--------+-------+----------------+-------------------+ [id number]            Multiphasic, WNL                    +----------+--------+-------+----------------+-------------------+ +---------+--------+--+--------+--+---------+ VertebralPSV cm/s68EDV cm/s21Antegrade +---------+--------+--+--------+--+---------+  Left Carotid Findings: +----------+--------+--------+--------+-------------------------------+--------+           PSV cm/sEDV cm/sStenosisPlaque Description             Comments +----------+--------+--------+--------+-------------------------------+--------+ CCA Prox  109     18                                                      +----------+--------+--------+--------+-------------------------------+--------+ CCA  [place]      25                                                      +----------+--------+--------+--------+-------------------------------+--------+ ICA Prox  73      22              heterogenous, focal and                                                   calcific                                +----------+--------+--------+--------+-------------------------------+--------+ ICA Distal76      22                                                      +----------+--------+--------+--------+-------------------------------+--------+ ECA       100     10                                                      +----------+--------+--------+--------+-------------------------------+--------+ +----------+--------+--------+----------------+-------------------+           PSV cm/sEDV cm/sDescribe        Arm Pressure (mmHG) +----------+--------+--------+----------------+-------------------+ [id number]              Multiphasic, WNL                    +----------+--------+--------+----------------+-------------------+ +---------+--------+--+--------+--+---------+ VertebralPSV cm/s78EDV cm/s24Antegrade +---------+--------+--+--------+--+---------+   Summary: Right Carotid: Velocities in the right ICA are consistent with a 1-39% stenosis. Left Carotid: Velocities in the left ICA are consistent with a 1-39% stenosis. Vertebrals:  Bilateral vertebral arteries demonstrate antegrade flow. Subclavians: Normal flow hemodynamics were seen in bilateral subclavian              arteries. *See table(s) above for measurements and observations.  Electronically signed by Jamelle Haring on 03/09/2022 at 8:21:24 PM.    Final    CT Angio Chest/Abd/Pel for Dissection W and/or Wo Contrast  Result Date: 03/08/2022 CLINICAL DATA:  Acute aortic syndrome (AAS) EXAM: CT ANGIOGRAPHY CHEST, ABDOMEN AND PELVIS TECHNIQUE: Non-contrast CT of the chest was initially obtained. Multidetector CT imaging through the chest, abdomen and pelvis was performed using the standard protocol during bolus administration of intravenous contrast. Multiplanar reconstructed images and MIPs were obtained and reviewed to evaluate the vascular anatomy. RADIATION DOSE REDUCTION: This exam was performed according to the departmental dose-optimization program which includes automated exposure control, adjustment of the mA and/or kV according to patient size and/or use of iterative reconstruction technique. 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 There is stable global cardiomegaly. No pericardial effusion. Left atrial clipping has been performed central  pulmonary arteries are enlarged in keeping with changes of pulmonary arterial hypertension, stable since prior examination. The thoracic aorta is  normal in course and caliber there is no evidence of intramural hematoma, aneurysm, or dissection. Bovine arch anatomy with wide patency of the arch vasculature proximally. 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 Mediastinum/Nodes: No enlarged mediastinal, hilar, or axillary lymph nodes. Thyroid gland, trachea, and esophagus demonstrate no significant findings. Lungs/Pleura: Lungs are clear. No pleural effusion or pneumothorax. Musculoskeletal: No acute bone abnormality. No lytic or blastic bone lesion. Review of the MIP images confirms the above findings. CTA ABDOMEN AND PELVIS FINDINGS VASCULAR Aorta: Mild atherosclerotic calcification within the abdominal aorta. No aortic aneurysm or dissection. No periaortic inflammatory change. Celiac: Patent without evidence of aneurysm, dissection, vasculitis or significant stenosis. SMA: Patent without evidence of aneurysm, dissection, vasculitis or significant stenosis. Renals: Single renal arteries bilaterally. No evidence of hemodynamically significant stenosis. Normal vascular morphology. 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 IMA: Patent without evidence of aneurysm, dissection, vasculitis or significant stenosis. Inflow: Patent without evidence of aneurysm, dissection, vasculitis or significant stenosis. Veins: No obvious venous abnormality within the limitations of this arterial phase study. Review of the MIP images confirms the above findings. NON-VASCULAR Hepatobiliary: No focal liver abnormality is seen. Status post cholecystectomy. No biliary dilatation. Pancreas: Unremarkable Spleen: Unremarkable  Adrenals/Urinary Tract: Adrenal glands are unremarkable. Kidneys are normal, without renal calculi, focal lesion, or hydronephrosis. Bladder is unremarkable. Stomach/Bowel: Stomach is within normal limits. Appendix appears normal. No evidence of bowel wall thickening, distention, or inflammatory changes. Lymphatic: A shotty gastrohepatic lymph node is identified at axial image # 141/5 measuring 10 mm in short axis diameter, unchanged from prior examination though slightly enlarged since remote prior examination of 03/04/2020, nonspecific. No additional pathologic adenopathy is seen within the abdomen and pelvis. Reproductive: Status post hysterectomy. No adnexal masses. Other: No abdominal wall hernia Musculoskeletal: No acute bone abnormality. No lytic or blastic bone lesion. Review of the MIP images confirms the above findings. IMPRESSION: 1. No evidence of thoracoabdominal aortic aneurysm or dissection. 2. 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 This is incompletely evaluated on this examination. Correlation with carotid artery duplex sonography or CT arteriography may be helpful for further evaluation. 3. 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 Stable global cardiomegaly. 4. Stable enlargement of the central pulmonary arteries in keeping with changes of pulmonary arterial hypertension. 5. 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 The more proximal demonstrates a bilobed configuration with thrombosis of 1 of the lobes measuring 1.7 x 2.1 x 2.2 cm the second demonstrating broad communication with the feeding vessel measures 1.3 x 1.6 x 1.4 cm in dimension. 6. Shotty gastrohepatic lymph node measuring 10 mm in short axis diameter, unchanged from prior examination though slightly enlarged since remote prior examination of 03/04/2020, nonspecific. Electronically Signed   By: Fidela Salisbury M.D.   On: 03/08/2022  20:02   DG Chest Portable 1 View  Result Date: 03/08/2022 CLINICAL DATA:  Provided history: Chest pain, shortness of breath. History of atrial fibrillation  and congestive heart failure. Prior open heart surgery. EXAM: PORTABLE CHEST 1 VIEW COMPARISON:  Prior chest radiographs 02/12/2022 and earlier. FINDINGS: Prior median sternotomy. Prior left atrial appendage clipping. Cardiomegaly. No appreciable airspace consolidation or pulmonary edema. No evidence of pleural effusion or pneumothorax. No acute bony abnormality identified. IMPRESSION: No evidence of acute cardiopulmonary abnormality. Cardiomegaly. Electronically Signed   By: Kellie Simmering D.O.   On: 03/08/2022 15:23   CARDIAC CATHETERIZATION  Result Date: 02/22/2022 1. Normal PCWP 2. Mild pulmonary venous hypertension. 3. Mildly elevated RA pressure. 4. Preserved cardiac output.   CARDIAC CATHETERIZATION  Result Date: 02/14/2022 Findings: RA = 27 RV = 62/25 PA = 60/27 (41) PCW = 25 (v = 35) Fick cardiac output/index = 6.8/2.8 PVR = 2.4 WU Ao sat = 90% PA sat = 45%, 49% PAPi = 1.2 Assessment: 1. Markedly elevated biventricular pressures 2. Low PAPI suggestive of RV dysfunction 3. PA sats are low but Fick CO maintained Plan/Discussion: Diurese further as renal function tolerates. Glori Bickers, MD 2:29 PM  DG Chest Port 1 View  Result Date: 02/12/2022 CLINICAL DATA:  CHF and 2 leaky valves. Shortness of breath at rest since last evening. EXAM: PORTABLE CHEST 1 VIEW COMPARISON:  02/06/2022 FINDINGS: Previous median sternotomy and left atrial clipping. Unchanged cardiac enlargement. New pulmonary vascular congestion without frank edema. No convincing evidence for pleural effusion. No airspace consolidation. IMPRESSION: New pulmonary vascular congestion. Electronically Signed   By: Kerby Moors M.D.   On: 02/12/2022 12:31    Labs:  CBC: Recent Labs    02/20/22 0727 02/22/22 1447 02/22/22 1448 02/23/22 0353 03/08/22 1503 03/13/22 0652   WBC 5.1  --   --  6.5 4.5 4.9  HGB 8.9*   < > 10.5* 9.5* 10.7* 9.5*  HCT 29.0*   < > 31.0* 31.2* 34.6* 29.9*  PLT 217  --   --  247 184 153   < > = values in this interval not displayed.    COAGS: Recent Labs    09/19/21 0944 11/24/21 2151 03/13/22 0652  INR 1.9* 2.3*  --   APTT  --   --  104*    BMP: Recent Labs    03/10/22 0233 03/11/22 0158 03/12/22 0758 03/13/22 0652  NA 137 137 139 138  K 3.2* 3.1* 3.9 3.7  CL 101 101 104 104  CO2 '25 27 26 23  '$ GLUCOSE 103* 100* 109* 98  BUN 36* 43* 58* 67*  CALCIUM 8.9 8.8* 9.1 8.9  CREATININE 3.12* 4.77* 6.41* 6.78*  GFRNONAA 16* 10* 7* 6*    LIVER FUNCTION TESTS: Recent Labs    01/11/22 1134 01/17/22 1350 02/06/22 1809 02/24/22 0156 03/12/22 0758 03/13/22 0652  BILITOT 0.6 0.8 1.5*  --  0.6  --   AST '21 23 27  '$ --  24  --   ALT '17 15 21  '$ --  21  --   ALKPHOS 171* 132* 145*  --  122  --   PROT 7.9 7.6 8.0  --  7.3  --   ALBUMIN 4.1 3.4* 3.3* 3.2* 3.2* 3.1*    Assessment and Plan:  AKI on CKD --now needs dialysis  --access needed --plan for [facility], tentative placement tomorrow 7/25 --NPO status placed for MN   Thank you for this interesting consult.  I greatly enjoyed meeting [name] and look forward to participating in their care.  A copy of this report was sent to the requesting provider on this date.  Electronically  SignedPasty Spillers, PA 03/13/2022, 12:43 PM   I spent a total of 30 minutes in face to face in clinical consultation, greater than 50% of which was counseling/coordinating care for tunneled dialysis catheter.

## 2022-03-13 NOTE — Progress Notes (Signed)
ANTICOAGULATION CONSULT NOTE - Follow-up Consult  Pharmacy Consult for heparin Indication: atrial fibrillation  Allergies  Allergen Reactions   Bee Pollen Anaphylaxis and Other (See Comments)    UNCONFIRMED, but IS allergic to bee VENOM   Bee Venom Anaphylaxis   Sulfa Antibiotics Itching    Patient Measurements: Height: '5\' 7"'$  (170.2 cm) Weight: 127.4 kg (280 lb 13.9 oz) IBW/kg (Calculated) : 61.6 Heparin Dosing Weight: 92 kg  Vital Signs: Temp: 97.3 F (36.3 C) (07/24 1646) Temp Source: Axillary (07/24 1646) BP: 111/84 (07/24 1646) Pulse Rate: 78 (07/24 1646)  Labs: Recent Labs    03/11/22 0158 03/12/22 0758 03/13/22 0652 03/13/22 1621  HGB  --   --  9.5*  --   HCT  --   --  29.9*  --   PLT  --   --  153  --   APTT  --   --  104* >200*  HEPARINUNFRC  --   --  >1.10*  --   CREATININE 4.77* 6.41* 6.78*  --      Estimated Creatinine Clearance: 11.5 mL/min (A) (by C-G formula based on SCr of 6.78 mg/dL (H)).   Medical History: Past Medical History:  Diagnosis Date   Acute on chronic systolic CHF (congestive heart failure) (HCC) 12/21/2020   Allergic rhinitis    Arthritis    Asthma    Chronic diastolic CHF (congestive heart failure) (HCC)    COPD (chronic obstructive pulmonary disease) (HCC)    Depression    DM (diabetes mellitus) ([place])    DVT (deep vein thrombosis) in pregnancy    GERD (gastroesophageal reflux disease)    HTN (hypertension)    Hyperlipidemia    Obesity    Pneumonia 04/2018   RIGHT LOBE   Sleep apnea    compliant with CPAP    Medications:  Medications Prior to Admission  Medication Sig Dispense Refill Last Dose   acetaminophen (TYLENOL) 325 MG tablet Take 2 tablets (650 mg total) by mouth every 6 (six) hours as needed for mild pain or moderate pain.   unk   albuterol (VENTOLIN HFA) 108 (90 Base) MCG/ACT inhaler INHALE 1-2 PUFFS BY MOUTH EVERY 6 HOURS AS NEEDED FOR WHEEZE OR SHORTNESS OF BREATH (Patient taking differently: Inhale 2  puffs into the lungs every 6 (six) hours as needed for wheezing or shortness of breath.) 18 g 1 Past Week   amiodarone (PACERONE) 200 MG tablet Take 1 tablet (200 mg total) by mouth daily. (Patient taking differently: Take 200 mg by mouth 2 (two) times daily.) 90 tablet 1 03/08/2022 at 1000   apixaban (ELIQUIS) 5 MG TABS tablet TAKE ONE TABLET BY MOUTH TWICE DAILY at [place] (Patient taking differently: Take 5 mg by mouth in the morning and at bedtime.) 60 tablet 1 03/08/2022 at 1000   busPIRone (BUSPAR) 5 MG tablet TAKE 1 TABLET BY MOUTH TWICE A DAY (Patient taking differently: Take 5 mg by mouth 2 (two) times daily.) 180 tablet 1 03/08/2022   colchicine 0.6 MG tablet Take 0.5 tablets (0.3 mg total) by mouth daily. 45 tablet 0 03/08/2022   diphenhydrAMINE (BENADRYL) 25 mg capsule Take 25 mg by mouth every 6 (six) hours as needed for allergies or itching.   unk   DULoxetine (CYMBALTA) 60 MG capsule TAKE 1 CAPSULE BY MOUTH EVERY DAY (Patient taking differently: Take 60 mg by mouth daily.) 90 capsule 3 03/08/2022   EPINEPHrine 0.3 mg/0.3 mL IJ SOAJ injection Inject 0.3 mg into the muscle  as needed for anaphylaxis. 1 each 1 unk   fluticasone (FLONASE) 50 MCG/ACT nasal spray Place 2 sprays into both nostrils daily. (Patient taking differently: Place 2 sprays into both nostrils daily as needed for allergies or rhinitis.) 16 g 6 unk   insulin aspart (NOVOLOG) 100 UNIT/ML injection Inject 2-10 Units into the skin 3 (three) times daily before meals. Per sliding scale (Patient taking differently: Inject 2-10 Units into the skin See admin instructions. Inject 2-10 units into the skin three times a day before meals, per sliding scale)   03/08/2022 at am   lidocaine (XYLOCAINE) 5 % ointment Apply 1 application. topically as needed for moderate pain. First choice is lidocaine patch, but if not covered/expensive, please fill ointment (Patient taking differently: Apply 1 application  topically 2 (two) times daily as needed  for moderate pain.) 35.44 g 3 unk   LINZESS 72 MCG capsule Take 72 mcg by mouth every morning.   03/08/2022 at am   loratadine (CLARITIN) 10 MG tablet Take 1 tablet (10 mg total) by mouth daily. 30 tablet 11 03/08/2022 at am   metolazone (ZAROXOLYN) 2.5 MG tablet Take 1 tablet (2.5 mg total) by mouth once a week. Monday.  Take extra tablet as needed for weight gain or lower extremity edema. (Patient taking differently: Take 2.5 mg by mouth See admin instructions. Take 2.5 mg by mouth on Mondays and Wednesdays) 8 tablet 11 03/08/2022   metoprolol succinate (TOPROL-XL) 25 MG 24 hr tablet TAKE 1 TABLET (25 MG) BY MOUTH IN THE MORNING AND AT BEDTIME (Patient taking differently: Take 25 mg by mouth in the morning and at bedtime.) 180 tablet 1 03/08/2022 at 1000   Multiple Vitamins-Minerals (MULTIVITAMIN WOMEN PO) Take 1 tablet by mouth daily.   03/08/2022   nitroGLYCERIN (NITROSTAT) 0.4 MG SL tablet Place 1 tablet (0.4 mg total) under the tongue every 5 (five) minutes x 3 doses as needed for chest pain. 25 tablet 12 unk   pantoprazole (PROTONIX) 40 MG tablet TAKE ONE TABLET BY MOUTH DAILY AT 9AM (Patient taking differently: Take 40 mg by mouth daily before breakfast.) 90 tablet 0 03/08/2022 at am   polyethylene glycol powder (GLYCOLAX/MIRALAX) 17 GM/SCOOP powder Take 17 g by mouth daily. (Patient taking differently: Take 17 g by mouth daily as needed for mild constipation.) 238 g 0 unk   potassium chloride SA (KLOR-CON M) 20 MEQ tablet Take 3 tablets (60 mEq total) by mouth daily. Take an additional 20 meq with every dose of METOLAZONE (Patient taking differently: Take 40 mEq by mouth 3 (three) times daily.) 180 tablet 3 03/08/2022   pregabalin (LYRICA) 100 MG capsule TAKE 1 CAPSULE BY MOUTH THREE TIMES A DAY (Patient taking differently: Take 100 mg by mouth 3 (three) times daily as needed (for pain).) 90 capsule 3 unk   RESTASIS 0.05 % ophthalmic emulsion Place 1 drop into both eyes in the morning and at bedtime.   12 03/08/2022 at am   rosuvastatin (CRESTOR) 10 MG tablet Take 1 tablet (10 mg total) by mouth every evening. (Patient taking differently: Take 10 mg by mouth daily.)   03/08/2022 at am   senna-docusate (SENOKOT-S) 8.6-50 MG tablet Take 1 tablet by mouth 2 (two) times daily. 14 tablet 0 03/08/2022 at am   torsemide (DEMADEX) 20 MG tablet Take 4 tablets (80 mg total) by mouth daily. Can take an additional 2 tablets as needed for increased weight gain (Patient taking differently: Take 60 mg by mouth in the morning.) 360  tablet 1 2/95/7473 at am   TRULICITY 1.5 UY/3.7QD SOPN INJECT 1.5 MG INTO THE SKIN ONCE A WEEK. (Patient taking differently: Inject 1.5 mg into the skin every Wednesday.) 0.5 mL 2 03/08/2022    Assessment: [name] on apixaban for PAF. Transitioned from apixaban to heparin for procedures. Apixaban will be affecting heparin levels so will utilize aPTT for monitoring until levels are correlating.  Heparin currently running at 1150 units/hr. aPTT rising from 104 to > 200 tonight despite dose decrease. Heparin drawn appropriately from opposite arm. Hgb 9.5 trending down. No s/sx of bleeding when communicating with RN and patient.     Goal of Therapy:  Heparin level 0.3-0.7 units/ml, aPTT 66-102 sec Monitor platelets by anticoagulation protocol: Yes   Plan:  Hold heparin x1 hr then decrease heparin infusion 900 units/hr  f/u aPTT in 8 hours Monitor daily heparin level, aPTT, and CBC Continue to monitor H&H    Titus Dubin, PharmD PGY1 Pharmacy Resident 03/13/2022 6:07 PM

## 2022-03-13 NOTE — Progress Notes (Signed)
Requested to see pt for out-pt HD needs at d/c. Met with pt at bedside. Introduced self and explained role. Pt resides here in [place] and prefers a clinic in this area. Will proceed with referral to [facility] tomorrow once pt has first treatment and needed clinicals available. Pt reports that friend will assist with transportation to out-pt HD appts at d/c. Will assist as needed.   Melven Sartorius Renal Navigator [phone]

## 2022-03-13 NOTE — Progress Notes (Signed)
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  Admitted with heart failure exacerbation and atypical chest pain.  Followed by cardiology.  Remains in the hospital due to persistent symptoms. She developed significant renal dysfunction and oliguria and being evaluated for dialysis initiation.   Assessment & Plan:   Acute on chronic systolic and diastolic congestive heart failure: Failed diuretic challenge.  Worsening renal function. Cardiology following.  Not a candidate for goal-directed therapy due to poor renal functions.  Cardiology anticipating conservative management.  Currently remains on Imdur 15 mg, metoprolol 25 mg twice daily and a statin.  Obstructive sleep apnea: Currently untreated.  Will need a new study and device.  Chest pain: Atypical.  Persistent epigastric and retrosternal pain.  EKG nonischemic.  Troponins nonischemic.  Prior cardiac catheterization with minimal coronary artery disease. Improved with IV Pepcid and intermittent use of fentanyl.  Acute coronary syndrome ruled out. Add stool softeners and laxatives to improve bowel movements.  Acute kidney injury on chronic kidney disease stage IV: Worsening renal functions.  Monitor closely.  Patient received contrast to rule out dissection.  Urine output 800 mm last 24 hours. Significantly worse renal functions today.  Discussed with nephrology for consultation.   Patient for tentative permacath placement today.  Paroxysmal A-fib: Currently in sinus rhythm.  On amiodarone.  Therapeutic on Eliquis.  Currently on heparin.  Valvular heart  disease, mitral valve repair tricuspid valve repair with residual severe tricuspid regurgitation: Probably exacerbating fluid imbalance.  Seen by structural heart team, advised she is not able to have surgery.  Hypokalemia: Replaced.  Adequate.  DVT prophylaxis:   Heparin infusion.   Code Status: Full code Family Communication: None Disposition Plan: Status is: Inpatient.  Remains inpatient due to significantly abnormal renal functions.   Consultants:  Cardiology  Procedures:  None  Antimicrobials:  None   Subjective:  Seen and examined.  Chest pain has improved.  No bowel movement since last 7 days.  Denies any nausea vomiting.  Breathing has improved.  Aware about nephrology discussion for dialysis.  Objective: Vitals:   03/12/22 2146 03/13/22 0428 03/13/22 0857 03/13/22 1347  BP: 138/76 125/69 109/64   Pulse: 74 73 74   Resp:  16    Temp:  98.6 F (37 C)    TempSrc:  Oral  Oral  SpO2:      Weight:  127.4 kg    Height:  '5\' 7"'$  (1.702 m)      Intake/Output Summary (Last 24 hours) at 03/13/2022 1424 Last data filed at 03/13/2022 8413 Gross per 24 hour  Intake --  Output 800 ml  Net -800 ml   Filed Weights   03/12/22 0519 03/12/22 0750 03/13/22 0428  Weight: 134.4 kg 125.9 kg 127.4 kg    Examination:  General exam: On room air.  Pleasant.  Interactive. Respiratory system: Difficult to assess.  No added sounds. Cardiovascular system: S1 & S2 heard, RRR.  No pitting edema.  She has large chronically edematous legs. Gastrointestinal system: Soft.  Nontender.  No rigidity or guarding. Central nervous system: Alert and oriented. No focal neurological deficits. Extremities: Symmetric 5 x 5 power. Skin: No rashes, lesions  or ulcers Psychiatry: Slightly anxious.    Data Reviewed: I have personally reviewed following labs and imaging studies  CBC: Recent Labs  Lab 03/08/22 1503 03/13/22 0652  WBC 4.5 4.9  HGB 10.7* 9.5*  HCT 34.6* 29.9*  MCV 84.0 81.9   PLT 184 161   Basic Metabolic Panel: Recent Labs  Lab 03/09/22 0558 03/09/22 1422 03/10/22 0233 03/11/22 0158 03/12/22 0758 03/13/22 0652  NA 141 140 137 137 139 138  K 2.8* 3.3* 3.2* 3.1* 3.9 3.7  CL 103 99 101 101 104 104  CO2 '29 29 25 27 26 23  '$ GLUCOSE 96 131* 103* 100* 109* 98  BUN 34* 32* 36* 43* 58* 67*  CREATININE 2.46* 2.83* 3.12* 4.77* 6.41* 6.78*  CALCIUM 9.0 9.2 8.9 8.8* 9.1 8.9  MG 1.8  --  1.8 1.8 2.0  --   PHOS  --   --   --   --   --  6.1*   GFR: Estimated Creatinine Clearance: 11.5 mL/min (A) (by C-G formula based on SCr of 6.78 mg/dL (H)). Liver Function Tests: Recent Labs  Lab 03/12/22 0758 03/13/22 0652  AST 24  --   ALT 21  --   ALKPHOS 122  --   BILITOT 0.6  --   PROT 7.3  --   ALBUMIN 3.2* 3.1*   No results for input(s): "LIPASE", "AMYLASE" in the last 168 hours. No results for input(s): "AMMONIA" in the last 168 hours. Coagulation Profile: No results for input(s): "INR", "PROTIME" in the last 168 hours. Cardiac Enzymes: No results for input(s): "CKTOTAL", "CKMB", "CKMBINDEX", "TROPONINI" in the last 168 hours. BNP (last 3 results) No results for input(s): "PROBNP" in the last 8760 hours. HbA1C: No results for input(s): "HGBA1C" in the last 72 hours. CBG: Recent Labs  Lab 03/12/22 1119 03/12/22 1652 03/12/22 2132 03/13/22 0757 03/13/22 1125  GLUCAP 116* 115* 118* 102* 90   Lipid Profile: No results for input(s): "CHOL", "HDL", "LDLCALC", "TRIG", "CHOLHDL", "LDLDIRECT" in the last 72 hours. Thyroid Function Tests: No results for input(s): "TSH", "T4TOTAL", "FREET4", "T3FREE", "THYROIDAB" in the last 72 hours. Anemia Panel: Recent Labs    03/13/22 0652  FERRITIN 118  TIBC 231*  IRON 63   Sepsis Labs: No results for input(s): "PROCALCITON", "LATICACIDVEN" in the last 168 hours.  Recent Results (from the past 240 hour(s))  SARS Coronavirus 2 by RT PCR (hospital order, performed in [facility] hospital lab) *cepheid single  result test* Anterior Nasal Swab     Status: None   Collection Time: 03/08/22  3:55 PM   Specimen: Anterior Nasal Swab  Result Value Ref Range Status   SARS Coronavirus 2 by RT PCR NEGATIVE NEGATIVE Final    Comment: (NOTE) SARS-CoV-2 target nucleic acids are NOT DETECTED.  The SARS-CoV-2 RNA is generally detectable in upper and lower respiratory specimens during the acute phase of infection. The lowest concentration of SARS-CoV-2 viral copies this assay can detect is 250 copies / mL. A negative result does not preclude SARS-CoV-2 infection and should not be used as the sole basis for treatment or other patient management decisions.  A negative result may occur with improper specimen collection / handling, submission of specimen other than nasopharyngeal swab, presence of viral mutation(s) within the areas targeted by this assay, and inadequate number of viral copies (<250 copies / mL). A negative result must be combined with clinical observations, patient history, and epidemiological information.  Fact Sheet for Patients:   https://www.patel.info/  Fact Sheet  for Healthcare Providers: https://hall.com/  This test is not yet approved or  cleared by the [place] and has been authorized for detection and/or diagnosis of SARS-CoV-2 by FDA under an Emergency Use Authorization (EUA).  This EUA will remain in effect (meaning this test can be used) for the duration of the COVID-19 declaration under Section 564(b)(1) of the Act, 21 U.S.C. section 360bbb-3(b)(1), unless the authorization is terminated or revoked sooner.  Performed at [facility], [address], [place], [place] [address]   Surgical PCR screen     Status: None   Collection Time: 03/12/22  9:20 PM   Specimen: Nasal Mucosa; Nasal Swab  Result Value Ref Range Status   MRSA, PCR NEGATIVE NEGATIVE Final   Staphylococcus aureus NEGATIVE NEGATIVE Final     Comment: (NOTE) The Xpert SA Assay (FDA approved for NASAL specimens in patients 53 years of age and older), is one component of a comprehensive surveillance program. It is not intended to diagnose infection nor to guide or monitor treatment. Performed at [facility], [place] [address], [place], [place] [address]          Radiology Studies: ECHOCARDIOGRAM LIMITED  Result Date: 03/13/2022    ECHOCARDIOGRAM LIMITED REPORT   Patient Name:   CORNEISHA ALVI Date of Exam: 03/13/2022 Medical Rec #:  [id number]      Height:       67.0 in Accession #:    [id number]     Weight:       280.9 lb Date of Birth:  12/10/1955      BSA:          2.336 m Patient Age:    11 years       BP:           109/64 mmHg Patient Gender: F              HR:           75 bpm. Exam Location:  Inpatient Procedure: Limited Echo Indications:    Congestive heart failure  History:        Patient has prior history of Echocardiogram examinations, most                 recent 01/20/2022. CHF, COPD; Risk Factors:Diabetes, Hypertension                 and Sleep Apnea.  Sonographer:    Jefferey Pica Referring Phys: [id number] [place]  1. Abnormal septal motion . Left ventricular ejection fraction, by estimation, is 50 to 55%. The left ventricle has low normal function. The left ventricle has no regional wall motion abnormalities.  2. Right ventricular systolic function is normal. The right ventricular size is normal.  3. Left atrial size was severely dilated.  4. Right atrial size was severely dilated.  5. Post repair with annuloplasty ring no doppler/color flow done. The mitral valve was not assessed. No evidence of mitral valve regurgitation. No evidence of mitral stenosis.  6. Post repair with ring no doppler/color flow done. Tricuspid valve regurgitation is not demonstrated.  7. The aortic valve was not assessed. Aortic valve regurgitation is not visualized. No aortic stenosis is present.  8. The inferior vena  cava is normal in size with greater than 50% respiratory variability, suggesting right atrial pressure of 3 mmHg. FINDINGS  Left Ventricle: Abnormal septal motion. Left ventricular ejection fraction, by estimation, is 50 to 55%. The left ventricle has low  normal function. The left ventricle has no regional wall motion abnormalities. The left ventricular internal cavity size was normal in size. There is no left ventricular hypertrophy. Right Ventricle: The right ventricular size is normal. No increase in right ventricular wall thickness. Right ventricular systolic function is normal. Left Atrium: Left atrial size was severely dilated. Right Atrium: Right atrial size was severely dilated. Pericardium: There is no evidence of pericardial effusion. Mitral Valve: Post repair with annuloplasty ring no doppler/color flow done. The mitral valve was not assessed. No evidence of mitral valve stenosis. Tricuspid Valve: Post repair with ring no doppler/color flow done. The tricuspid valve is not assessed. Tricuspid valve regurgitation is not demonstrated. No evidence of tricuspid stenosis. Aortic Valve: The aortic valve was not assessed. Aortic valve regurgitation is not visualized. No aortic stenosis is present. Pulmonic Valve: The pulmonic valve was not assessed. Pulmonic valve regurgitation is not visualized. No evidence of pulmonic stenosis. Aorta: The aortic root is normal in size and structure. Venous: The inferior vena cava is normal in size with greater than 50% respiratory variability, suggesting right atrial pressure of 3 mmHg. IAS/Shunts: No atrial level shunt detected by color flow Doppler. LEFT VENTRICLE PLAX 2D LVIDd:         5.35 cm LVIDs:         3.70 cm LV PW:         1.05 cm LV IVS:        0.80 cm  LV Volumes (MOD) LV vol d, MOD A4C: 144.0 ml LV vol s, MOD A4C: 70.3 ml LV SV MOD A4C:     144.0 ml IVC IVC diam: 2.30 cm LEFT ATRIUM              Index        RIGHT ATRIUM           Index LA diam:        5.60 cm   2.40 cm/m   RA Area:     31.60 cm LA Vol (A2C):   109.0 ml 46.66 ml/m  RA Volume:   129.00 ml 55.22 ml/m LA Vol (A4C):   99.5 ml  42.59 ml/m LA Biplane Vol: 105.0 ml 44.94 ml/m   AORTA Ao Root diam: 3.10 cm Ao Asc diam:  3.00 cm Jenkins Rouge MD Electronically signed by Jenkins Rouge MD Signature Date/Time: 03/13/2022/10:21:28 AM    Final    US RENAL  Result Date: 03/12/2022 CLINICAL DATA:  Acute kidney injury. EXAM: RENAL / URINARY TRACT ULTRASOUND COMPLETE COMPARISON:  CT AP 03/08/2022 FINDINGS: Right Kidney: Renal measurements: 9.8 x 5.3 by 5.7 cm = volume: 154.7 mL. Echogenicity within normal limits. No mass or hydronephrosis visualized. Left Kidney: Renal measurements: 10.0 x 5.5 x 5.4 cm = volume: 153.0 mL. Echogenicity within normal limits. No mass or hydronephrosis visualized. Bladder: Appears normal for degree of bladder distention. Other: None. IMPRESSION: Normal renal sonogram. Electronically Signed   By: Kerby Moors M.D.   On: 03/12/2022 12:52        Scheduled Meds:  amiodarone  200 mg Oral Daily   darbepoetin (ARANESP) injection - NON-DIALYSIS  60 mcg Subcutaneous Q Mon-1800   insulin aspart  0-5 Units Subcutaneous QHS   insulin aspart  0-6 Units Subcutaneous TID WC   isosorbide mononitrate  15 mg Oral Daily   metoprolol succinate  25 mg Oral BID   polyethylene glycol  17 g Oral Daily   rosuvastatin  10 mg Oral Daily   Continuous Infusions:  famotidine (PEPCID) IV 10 mg (03/12/22 2159)   ferric gluconate (FERRLECIT) IVPB 250 mg (03/13/22 1258)   heparin 1,150 Units/hr (03/13/22 1017)     LOS: 3 days    Time spent: 35 minutes    Barb Merino, MD [facility] Pager [phone]

## 2022-03-14 ENCOUNTER — Telehealth (HOSPITAL_COMMUNITY): Payer: Self-pay

## 2022-03-14 ENCOUNTER — Inpatient Hospital Stay (HOSPITAL_COMMUNITY): Payer: 59

## 2022-03-14 DIAGNOSIS — I480 Paroxysmal atrial fibrillation: Secondary | ICD-10-CM | POA: Diagnosis not present

## 2022-03-14 DIAGNOSIS — I5043 Acute on chronic combined systolic (congestive) and diastolic (congestive) heart failure: Secondary | ICD-10-CM | POA: Diagnosis not present

## 2022-03-14 DIAGNOSIS — R079 Chest pain, unspecified: Secondary | ICD-10-CM | POA: Diagnosis not present

## 2022-03-14 HISTORY — PX: IR US GUIDE VASC ACCESS RIGHT: IMG2390

## 2022-03-14 HISTORY — PX: IR FLUORO GUIDE CV LINE RIGHT: IMG2283

## 2022-03-14 LAB — RENAL FUNCTION PANEL
Albumin: 3.3 g/dL — ABNORMAL LOW (ref 3.5–5.0)
Anion gap: 9 (ref 5–15)
BUN: 71 mg/dL — ABNORMAL HIGH (ref 8–23)
CO2: 24 mmol/L (ref 22–32)
Calcium: 9 mg/dL (ref 8.9–10.3)
Chloride: 104 mmol/L (ref 98–111)
Creatinine, Ser: 6.7 mg/dL — ABNORMAL HIGH (ref 0.44–1.00)
GFR, Estimated: 6 mL/min — ABNORMAL LOW (ref >60)
Glucose, Bld: 115 mg/dL — ABNORMAL HIGH (ref 70–99)
Phosphorus: 5.6 mg/dL — ABNORMAL HIGH (ref 2.5–4.6)
Potassium: 3.4 mmol/L — ABNORMAL LOW (ref 3.5–5.1)
Sodium: 137 mmol/L (ref 135–145)

## 2022-03-14 LAB — CBC
HCT: 29.2 % — ABNORMAL LOW (ref 36.0–46.0)
HCT: 29.6 % — ABNORMAL LOW (ref 36.0–46.0)
Hemoglobin: 9.2 g/dL — ABNORMAL LOW (ref 12.0–15.0)
Hemoglobin: 9.5 g/dL — ABNORMAL LOW (ref 12.0–15.0)
MCH: 26.2 pg (ref 26.0–34.0)
MCH: 26.8 pg (ref 26.0–34.0)
MCHC: 31.5 g/dL (ref 30.0–36.0)
MCHC: 32.1 g/dL (ref 30.0–36.0)
MCV: 83.2 fL (ref 80.0–100.0)
MCV: 83.6 fL (ref 80.0–100.0)
Platelets: 152 10*3/uL (ref 150–400)
Platelets: 146 10*3/uL — ABNORMAL LOW (ref 150–400)
RBC: 3.51 MIL/uL — ABNORMAL LOW (ref 3.87–5.11)
RBC: 3.54 MIL/uL — ABNORMAL LOW (ref 3.87–5.11)
RDW: 18.8 % — ABNORMAL HIGH (ref 11.5–15.5)
RDW: 19.2 % — ABNORMAL HIGH (ref 11.5–15.5)
WBC: 4.7 10*3/uL (ref 4.0–10.5)
WBC: 3.7 10*3/uL — ABNORMAL LOW (ref 4.0–10.5)
nRBC: 0 % (ref 0.0–0.2)
nRBC: 0 % (ref 0.0–0.2)

## 2022-03-14 LAB — HEPARIN LEVEL (UNFRACTIONATED): Heparin Unfractionated: >1.1 IU/mL — ABNORMAL HIGH (ref 0.30–0.70)

## 2022-03-14 LAB — GLUCOSE, CAPILLARY
Glucose-Capillary: 123 mg/dL — ABNORMAL HIGH (ref 70–99)
Glucose-Capillary: 162 mg/dL — ABNORMAL HIGH (ref 70–99)
Glucose-Capillary: 101 mg/dL — ABNORMAL HIGH (ref 70–99)
Glucose-Capillary: 156 mg/dL — ABNORMAL HIGH (ref 70–99)

## 2022-03-14 LAB — APTT
aPTT: 99 seconds — ABNORMAL HIGH (ref 24–36)
aPTT: 41 seconds — ABNORMAL HIGH (ref 24–36)

## 2022-03-14 LAB — PARATHYROID HORMONE, INTACT (NO CA): PTH: 86 pg/mL — ABNORMAL HIGH (ref 15–65)

## 2022-03-14 LAB — HEPATITIS B CORE ANTIBODY, TOTAL: Hep B Core Total Ab: NONREACTIVE

## 2022-03-14 MED ORDER — CEFAZOLIN SODIUM-DEXTROSE 2-4 GM/100ML-% IV SOLN
2.0000 g | Freq: Once | INTRAVENOUS | Status: AC
  Filled 2022-03-14: qty 100

## 2022-03-14 MED ORDER — FAMOTIDINE 20 MG PO TABS
20.0000 mg | ORAL_TABLET | Freq: Every day | ORAL | Status: DC
  Administered 2022-03-14 – 2022-03-18 (×5): 20 mg via ORAL
  Filled 2022-03-14 (×5): qty 1

## 2022-03-14 MED ORDER — MIDAZOLAM HCL 2 MG/2ML IJ SOLN
INTRAMUSCULAR | Status: AC | PRN
  Administered 2022-03-14: 1 mg via INTRAVENOUS

## 2022-03-14 MED ORDER — MIDAZOLAM HCL 2 MG/2ML IJ SOLN
INTRAMUSCULAR | Status: AC
  Filled 2022-03-14: qty 2

## 2022-03-14 MED ORDER — HYDROCORTISONE 1 % EX CREA
1.0000 | TOPICAL_CREAM | Freq: Three times a day (TID) | CUTANEOUS | Status: DC | PRN
  Filled 2022-03-14: qty 28

## 2022-03-14 MED ORDER — PENTAFLUOROPROP-TETRAFLUOROETH EX AERO: 1.0000 | INHALATION_SPRAY | CUTANEOUS | Status: DC | PRN

## 2022-03-14 MED ORDER — ANTICOAGULANT SODIUM CITRATE 4% (200MG/5ML) IV SOLN: 5.0000 mL | Status: DC | PRN

## 2022-03-14 MED ORDER — CHLORHEXIDINE GLUCONATE CLOTH 2 % EX PADS: 6.0000 | MEDICATED_PAD | Freq: Every day | CUTANEOUS | Status: DC

## 2022-03-14 MED ORDER — APIXABAN 5 MG PO TABS
5.0000 mg | ORAL_TABLET | Freq: Two times a day (BID) | ORAL | Status: DC
  Administered 2022-03-14 – 2022-03-15 (×3): 5 mg via ORAL
  Filled 2022-03-14 (×3): qty 1

## 2022-03-14 MED ORDER — LIDOCAINE-PRILOCAINE 2.5-2.5 % EX CREA: 1.0000 | TOPICAL_CREAM | CUTANEOUS | Status: DC | PRN

## 2022-03-14 MED ORDER — FENTANYL CITRATE (PF) 100 MCG/2ML IJ SOLN
INTRAMUSCULAR | Status: AC
  Filled 2022-03-14: qty 2

## 2022-03-14 MED ORDER — GELATIN ABSORBABLE 12-7 MM EX MISC
CUTANEOUS | Status: AC
  Filled 2022-03-14: qty 1

## 2022-03-14 MED ORDER — ALTEPLASE 2 MG IJ SOLR: 2.0000 mg | Freq: Once | INTRAMUSCULAR | Status: DC | PRN

## 2022-03-14 MED ORDER — LIDOCAINE-EPINEPHRINE 1 %-1:100000 IJ SOLN
INTRAMUSCULAR | Status: AC
  Administered 2022-03-14: 10 mL
  Filled 2022-03-14: qty 1

## 2022-03-14 MED ORDER — FENTANYL CITRATE (PF) 100 MCG/2ML IJ SOLN
INTRAMUSCULAR | Status: AC | PRN
  Administered 2022-03-14: 25 ug via INTRAVENOUS

## 2022-03-14 MED ORDER — HEPARIN SODIUM (PORCINE) 1000 UNIT/ML IJ SOLN
INTRAMUSCULAR | Status: AC
  Administered 2022-03-14: 3.2 mL
  Filled 2022-03-14: qty 10

## 2022-03-14 MED ORDER — HEPARIN SODIUM (PORCINE) 1000 UNIT/ML DIALYSIS
1000.0000 [IU] | INTRAMUSCULAR | Status: DC | PRN
  Filled 2022-03-14: qty 1

## 2022-03-14 MED ORDER — CEFAZOLIN SODIUM-DEXTROSE 2-4 GM/100ML-% IV SOLN
INTRAVENOUS | Status: AC
  Administered 2022-03-14: 2 g via INTRAVENOUS
  Filled 2022-03-14: qty 100

## 2022-03-14 MED ORDER — LIDOCAINE HCL (PF) 1 % IJ SOLN: 5.0000 mL | INTRAMUSCULAR | Status: DC | PRN

## 2022-03-14 NOTE — Progress Notes (Signed)
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  Admitted with heart failure exacerbation and atypical chest pain.  Followed by cardiology.  Remains in the hospital due to persistent symptoms. She developed significant renal dysfunction and oliguria, started on dialysis.   Assessment & Plan:   Acute on chronic systolic and diastolic congestive heart failure: Failed diuretic challenge.  Worsening renal function. Cardiology following.  Not a candidate for goal-directed therapy due to poor renal functions.  Cardiology anticipating conservative management.  Currently remains on Imdur 15 mg, metoprolol 25 mg twice daily and a statin.  Obstructive sleep apnea: Currently untreated.  Will need a new study and device.  Chest pain: Atypical.  Persistent epigastric and retrosternal pain.  EKG nonischemic.  Troponins nonischemic.  Prior cardiac catheterization with minimal coronary artery disease. Improved with IV Pepcid and intermittent use of fentanyl.  Acute coronary syndrome ruled out. We will change to oral.  Acute kidney injury on chronic kidney disease stage IV: Worsening renal functions.  Monitor closely.  Patient received contrast to rule out dissection.   Significantly worse renal functions.  Permacath placed and started on hemodialysis today.  Paroxysmal A-fib: Currently in sinus rhythm.  On amiodarone.  Heparin bridging.  Will resume Eliquis today.  Valvular heart disease, mitral valve repair tricuspid valve repair with residual severe tricuspid regurgitation: Probably exacerbating fluid imbalance.  Seen by  structural heart team, advised she is not able to have surgery.  Hypokalemia: Replaced.  Adequate.  DVT prophylaxis:   Heparin infusion.   Code Status: Full code Family Communication: None Disposition Plan: Status is: Inpatient.  Remains inpatient due to significantly abnormal renal functions.  New onset hemodialysis.   Consultants:  Cardiology Nephrology  Procedures:  Permacath  Antimicrobials:  None   Subjective:  Seen and examined.  Denies any shortness of breath.  Just came back from the procedure.  Daughter was on the FaceTime. Still has mild epigastric discomfort.  She had good response to laxative.  Objective: Vitals:   03/14/22 0845 03/14/22 0850 03/14/22 0905 03/14/22 0922  BP: (!) 149/75 (!) 150/76 (!) 159/69 116/61  Pulse: 81 85 85 83  Resp: (!) 22 (!) 23 (!) 22 19  Temp:    97.8 F (36.6 C)  TempSrc:    Oral  SpO2: 100% 100% 99% 100%  Weight:      Height:        Intake/Output Summary (Last 24 hours) at 03/14/2022 1349 Last data filed at 03/14/2022 0700 Gross per 24 hour  Intake 920.1 ml  Output 1000 ml  Net -79.9 ml   Filed Weights   03/12/22 0750 03/13/22 0428 03/14/22 0646  Weight: 125.9 kg 127.4 kg 127.4 kg    Examination:  General exam: On room air.  Pleasant.  Interactive. Tunneled catheter right IJ. Respiratory system: Difficult to assess.  No added sounds. Cardiovascular system: S1 & S2 heard, RRR.  No pitting edema.  She has large chronically edematous legs. Gastrointestinal system: Soft.  Nontender.  No rigidity or guarding. Central nervous system: Alert and oriented. No focal neurological deficits. Extremities: Symmetric 5 x 5 power. Skin: No rashes, lesions or ulcers Psychiatry: Slightly anxious.  Data Reviewed: I have personally reviewed following labs and imaging studies  CBC: Recent Labs  Lab 03/08/22 1503 03/13/22 0652 03/14/22 0213 03/14/22 1004  WBC 4.5 4.9 4.7 3.7*  HGB 10.7* 9.5* 9.2* 9.5*  HCT 34.6* 29.9*  29.2* 29.6*  MCV 84.0 81.9 83.2 83.6  PLT 184 153 152 196*   Basic Metabolic Panel: Recent Labs  Lab 03/09/22 0558 03/09/22 1422 03/10/22 0233 03/11/22 0158 03/12/22 0758 03/13/22 0652 03/14/22 1004  NA 141   < > 137 137 139 138 137  K 2.8*   < > 3.2* 3.1* 3.9 3.7 3.4*  CL 103   < > 101 101 104 104 104  CO2 29   < > _0 GLUCOSE 96   < > 103* 100* 109* 98 115*  BUN 34*   < > 36* 43* 58* 67* 71*  CREATININE 2.46*   < > 3.12* 4.77* 6.41* 6.78* 6.70*  CALCIUM 9.0   < > 8.9 8.8* 9.1 8.9 9.0  MG 1.8  --  1.8 1.8 2.0  --   --   PHOS  --   --   --   --   --  6.1* 5.6*   < > = values in this interval not displayed.   GFR: Estimated Creatinine Clearance: 11.6 mL/min (A) (by C-G formula based on SCr of 6.7 mg/dL (H)). Liver Function Tests: Recent Labs  Lab 03/12/22 0758 03/13/22 0652 03/14/22 1004  AST 24  --   --   ALT 21  --   --   ALKPHOS 122  --   --   BILITOT 0.6  --   --   PROT 7.3  --   --   ALBUMIN 3.2* 3.1* 3.3*   No results for input(s): "LIPASE", "AMYLASE" in the last 168 hours. No results for input(s): "AMMONIA" in the last 168 hours. Coagulation Profile: No results for input(s): "INR", "PROTIME" in the last 168 hours. Cardiac Enzymes: No results for input(s): "CKTOTAL", "CKMB", "CKMBINDEX", "TROPONINI" in the last 168 hours. BNP (last 3 results) No results for input(s): "PROBNP" in the last 8760 hours. HbA1C: No results for input(s): "HGBA1C" in the last 72 hours. CBG: Recent Labs  Lab 03/13/22 1125 03/13/22 1554 03/13/22 2116 03/14/22 0729 03/14/22 1112  GLUCAP 90 187* 111* 123* 162*   Lipid Profile: No results for input(s): "CHOL", "HDL", "LDLCALC", "TRIG", "CHOLHDL", "LDLDIRECT" in the last 72 hours. Thyroid Function Tests: No results for input(s): "TSH", "T4TOTAL", "FREET4", "T3FREE", "THYROIDAB" in the last 72 hours. Anemia Panel: Recent Labs    03/13/22 0652  FERRITIN 118  TIBC 231*  IRON 63   Sepsis Labs: No results for  input(s): "PROCALCITON", "LATICACIDVEN" in the last 168 hours.  Recent Results (from the past 240 hour(s))  SARS Coronavirus 2 by RT PCR (hospital order, performed in [facility] hospital lab) *cepheid single result test* Anterior Nasal Swab     Status: None   Collection Time: 03/08/22  3:55 PM   Specimen: Anterior Nasal Swab  Result Value Ref Range Status   SARS Coronavirus 2 by RT PCR NEGATIVE NEGATIVE Final    Comment: (NOTE) SARS-CoV-2 target nucleic acids are NOT DETECTED.  The SARS-CoV-2 RNA is generally detectable in upper and lower respiratory specimens during the acute phase of infection. The lowest concentration of SARS-CoV-2 viral copies this assay can detect is 250 copies / mL. A negative result does not preclude SARS-CoV-2 infection and should not be used as the sole  basis for treatment or other patient management decisions.  A negative result may occur with improper specimen collection / handling, submission of specimen other than nasopharyngeal swab, presence of viral mutation(s) within the areas targeted by this assay, and inadequate number of viral copies (<250 copies / mL). A negative result must be combined with clinical observations, patient history, and epidemiological information.  Fact Sheet for Patients:   https://www.patel.info/  Fact Sheet for Healthcare Providers: https://hall.com/  This test is not yet approved or  cleared by the [place] FDA and has been authorized for detection and/or diagnosis of SARS-CoV-2 by FDA under an Emergency Use Authorization (EUA).  This EUA will remain in effect (meaning this test can be used) for the duration of the COVID-19 declaration under Section 564(b)(1) of the Act, 21 U.S.C. section 360bbb-3(b)(1), unless the authorization is terminated or revoked sooner.  Performed at [facility], [address], [place], [place] [address]   Surgical PCR screen      Status: None   Collection Time: 03/12/22  9:20 PM   Specimen: Nasal Mucosa; Nasal Swab  Result Value Ref Range Status   MRSA, PCR NEGATIVE NEGATIVE Final   Staphylococcus aureus NEGATIVE NEGATIVE Final    Comment: (NOTE) The Xpert SA Assay (FDA approved for NASAL specimens in patients 16 years of age and older), is one component of a comprehensive surveillance program. It is not intended to diagnose infection nor to guide or monitor treatment. Performed at [facility], [place] [address], [place], [place] [address]          Radiology Studies: IR Fluoro Guide CV Line Right  Result Date: 03/14/2022 INDICATION: 66 year old female referred for tunneled hemodialysis catheter EXAM: TUNNELED CENTRAL VENOUS HEMODIALYSIS CATHETER PLACEMENT WITH ULTRASOUND AND FLUOROSCOPIC GUIDANCE MEDICATIONS: 2 g Ancef. The antibiotic was given in an appropriate time interval prior to skin puncture. ANESTHESIA/SEDATION: Moderate (conscious) sedation was employed during this procedure. A total of Versed 1.0 mg and Fentanyl 25 mcg was administered intravenously by the radiology nurse. Total intra-service moderate Sedation Time: 15 minutes. The patient's level of consciousness and vital signs were monitored continuously by radiology nursing throughout the procedure under my direct supervision. FLUOROSCOPY: Radiation Exposure Index (as provided by the fluoroscopic device): 2 mGy Kerma COMPLICATIONS: None PROCEDURE: Informed written consent was obtained from the patient after a discussion of the risks, benefits, and alternatives to treatment. Questions regarding the procedure were encouraged and answered. The right neck and chest were prepped with chlorhexidine in a sterile fashion, and a sterile drape was applied covering the operative field. Maximum barrier sterile technique with sterile gowns and gloves were used for the procedure. A timeout was performed prior to the initiation of the procedure. Ultrasound  survey was performed. The right internal jugular vein was confirmed to be patent, with images stored and sent to PACS. Micropuncture kit was utilized to access the right internal jugular vein under direct, real-time ultrasound guidance after the overlying soft tissues were anesthetized with 1% lidocaine with epinephrine. Stab incision was made with 11 blade scalpel. Microwire was passed centrally. The microwire was then marked to measure appropriate internal catheter length. External tunneled length was estimated. A total tip to cuff length of 19 cm was selected. 035 guidewire was advanced to the level of the IVC. Skin and subcutaneous tissues of chest wall below the clavicle were generously infiltrated with 1% lidocaine for local anesthesia. A small stab incision was made with 11 blade scalpel. The selected hemodialysis catheter was  tunneled in a retrograde fashion from the anterior chest wall to the venotomy incision. Serial dilation was performed and then a peel-away sheath was placed. The catheter was then placed through the peel-away sheath with tips ultimately positioned within the superior aspect of the right atrium. Final catheter positioning was confirmed and documented with a spot radiographic image. The catheter aspirates and flushes normally. The catheter was flushed with appropriate volume heparin dwells. The catheter exit site was secured with a 0-Prolene retention suture. Gel-Foam slurry was infused into the soft tissue tract. The venotomy incision was closed Derma bond and sterile dressing. Dressings were applied at the chest wall. Patient tolerated the procedure well and remained hemodynamically stable throughout. No complications were encountered and no significant blood loss encountered. IMPRESSION: Status post right IJ tunneled hemodialysis catheter Signed, Dulcy Fanny. Nadene Rubins, RPVI [facility] Radiology Electronically Signed   By: Corrie Mckusick D.O.   On: 03/14/2022 10:34   IR US Guide Vasc Access Right  Result Date: 03/14/2022 INDICATION: 66 year old female referred for tunneled hemodialysis catheter EXAM: TUNNELED CENTRAL VENOUS HEMODIALYSIS CATHETER PLACEMENT WITH ULTRASOUND AND FLUOROSCOPIC GUIDANCE MEDICATIONS: 2 g Ancef. The antibiotic was given in an appropriate time interval prior to skin puncture. ANESTHESIA/SEDATION: Moderate (conscious) sedation was employed during this procedure. A total of Versed 1.0 mg and Fentanyl 25 mcg was administered intravenously by the radiology nurse. Total intra-service moderate Sedation Time: 15 minutes. The patient's level of consciousness and vital signs were monitored continuously by radiology nursing throughout the procedure under my direct supervision. FLUOROSCOPY: Radiation Exposure Index (as provided by the fluoroscopic device): 2 mGy Kerma COMPLICATIONS: None PROCEDURE: Informed written consent was obtained from the patient after a discussion of the risks, benefits, and alternatives to treatment. Questions regarding the procedure were encouraged and answered. The right neck and chest were prepped with chlorhexidine in a sterile fashion, and a sterile drape was applied covering the operative field. Maximum barrier sterile technique with sterile gowns and gloves were used for the procedure. A timeout was performed prior to the initiation of the procedure. Ultrasound survey was performed. The right internal jugular vein was confirmed to be patent, with images stored and sent to PACS. Micropuncture kit was utilized to access the right internal jugular vein under direct, real-time ultrasound guidance after the overlying soft tissues were anesthetized with 1% lidocaine with epinephrine. Stab incision was made with 11 blade scalpel. Microwire was passed centrally. The microwire was then marked to measure appropriate internal catheter length. External tunneled length was estimated. A total tip to cuff  length of 19 cm was selected. 035 guidewire was advanced to the level of the IVC. Skin and subcutaneous tissues of chest wall below the clavicle were generously infiltrated with 1% lidocaine for local anesthesia. A small stab incision was made with 11 blade scalpel. The selected hemodialysis catheter was tunneled in a retrograde fashion from the anterior chest wall to the venotomy incision. Serial dilation was performed and then a peel-away sheath was placed. The catheter was then placed through the peel-away sheath with tips ultimately positioned within the superior aspect of the right atrium. Final catheter positioning was confirmed and documented with a spot radiographic image. The catheter aspirates and flushes normally. The catheter was flushed with appropriate volume heparin dwells. The catheter exit site was secured with a 0-Prolene retention suture. Gel-Foam slurry was infused into the soft tissue tract. The venotomy incision was closed Derma bond and sterile dressing. Dressings were applied  at the chest wall. Patient tolerated the procedure well and remained hemodynamically stable throughout. No complications were encountered and no significant blood loss encountered. IMPRESSION: Status post right IJ tunneled hemodialysis catheter Signed, Dulcy Fanny. Nadene Rubins, RPVI [facility] Radiology Electronically Signed   By: Corrie Mckusick D.O.   On: 03/14/2022 10:34   ECHOCARDIOGRAM LIMITED  Result Date: 03/13/2022    ECHOCARDIOGRAM LIMITED REPORT   Patient Name:   TRINIDAD INGLE Date of Exam: 03/13/2022 Medical Rec #:  [id number]      Height:       67.0 in Accession #:    [id number]     Weight:       280.9 lb Date of Birth:  [birth year]      BSA:          2.336 m Patient Age:    66 years       BP:           109/64 mmHg Patient Gender: F              HR:           75 bpm. Exam Location:  Inpatient Procedure: Limited Echo Indications:    Congestive heart failure   History:        Patient has prior history of Echocardiogram examinations, most                 recent 01/20/2022. CHF, COPD; Risk Factors:Diabetes, Hypertension                 and Sleep Apnea.  Sonographer:    Jefferey Pica Referring Phys: [id number] [place]  1. Abnormal septal motion . Left ventricular ejection fraction, by estimation, is 50 to 55%. The left ventricle has low normal function. The left ventricle has no regional wall motion abnormalities.  2. Right ventricular systolic function is normal. The right ventricular size is normal.  3. Left atrial size was severely dilated.  4. Right atrial size was severely dilated.  5. Post repair with annuloplasty ring no doppler/color flow done. The mitral valve was not assessed. No evidence of mitral valve regurgitation. No evidence of mitral stenosis.  6. Post repair with ring no doppler/color flow done. Tricuspid valve regurgitation is not demonstrated.  7. The aortic valve was not assessed. Aortic valve regurgitation is not visualized. No aortic stenosis is present.  8. The inferior vena cava is normal in size with greater than 50% respiratory variability, suggesting right atrial pressure of 3 mmHg. FINDINGS  Left Ventricle: Abnormal septal motion. Left ventricular ejection fraction, by estimation, is 50 to 55%. The left ventricle has low normal function. The left ventricle has no regional wall motion abnormalities. The left ventricular internal cavity size was normal in size. There is no left ventricular hypertrophy. Right Ventricle: The right ventricular size is normal. No increase in right ventricular wall thickness. Right ventricular systolic function is normal. Left Atrium: Left atrial size was severely dilated. Right Atrium: Right atrial size was severely dilated. Pericardium: There is no evidence of pericardial effusion. Mitral Valve: Post repair with annuloplasty ring no doppler/color flow done. The mitral valve was not assessed. No  evidence of mitral valve stenosis. Tricuspid Valve: Post repair with ring no doppler/color flow done. The tricuspid valve is not assessed. Tricuspid valve regurgitation is not demonstrated. No evidence of tricuspid stenosis. Aortic Valve: The aortic valve was not assessed. Aortic valve regurgitation is not visualized. No aortic stenosis  is present. Pulmonic Valve: The pulmonic valve was not assessed. Pulmonic valve regurgitation is not visualized. No evidence of pulmonic stenosis. Aorta: The aortic root is normal in size and structure. Venous: The inferior vena cava is normal in size with greater than 50% respiratory variability, suggesting right atrial pressure of 3 mmHg. IAS/Shunts: No atrial level shunt detected by color flow Doppler. LEFT VENTRICLE PLAX 2D LVIDd:         5.35 cm LVIDs:         3.70 cm LV PW:         1.05 cm LV IVS:        0.80 cm  LV Volumes (MOD) LV vol d, MOD A4C: 144.0 ml LV vol s, MOD A4C: 70.3 ml LV SV MOD A4C:     144.0 ml IVC IVC diam: 2.30 cm LEFT ATRIUM              Index        RIGHT ATRIUM           Index LA diam:        5.60 cm  2.40 cm/m   RA Area:     31.60 cm LA Vol (A2C):   109.0 ml 46.66 ml/m  RA Volume:   129.00 ml 55.22 ml/m LA Vol (A4C):   99.5 ml  42.59 ml/m LA Biplane Vol: 105.0 ml 44.94 ml/m   AORTA Ao Root diam: 3.10 cm Ao Asc diam:  3.00 cm Jenkins Rouge MD Electronically signed by Jenkins Rouge MD Signature Date/Time: 03/13/2022/10:21:28 AM    Final         Scheduled Meds:  amiodarone  200 mg Oral Daily   Chlorhexidine Gluconate Cloth  6 each Topical Q0600   darbepoetin (ARANESP) injection - NON-DIALYSIS  60 mcg Subcutaneous Q Mon-1800   famotidine  20 mg Oral BID   insulin aspart  0-5 Units Subcutaneous QHS   insulin aspart  0-6 Units Subcutaneous TID WC   isosorbide mononitrate  15 mg Oral Daily   metoprolol succinate  25 mg Oral BID   rosuvastatin  10 mg Oral Daily   Continuous Infusions:  sodium chloride 10 mL/hr at 03/13/22 2216    anticoagulant sodium citrate     ferric gluconate (FERRLECIT) IVPB 250 mg (03/13/22 1258)     LOS: 4 days    Time spent: 35 minutes    Barb Merino, MD [facility] Pager [phone]

## 2022-03-14 NOTE — Progress Notes (Addendum)
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 Now likely ESRD at this point. -ineffective diuresis with recurrent admissions for hypervolemia and now with uremic symptoms, Dr. Candiss Norse had a lengthy discussion in regards to renal replacement therapy. Discussed rationale and process. Likely will be long-term HD. Had diuretic challenge with lasix and metolazone yesterday with ~1.8 L urine output- she is comfortable and does not really have LE edema at this time.  -TDC placed today with IR -Plan for first HD session today- second tomorrow- clip in process    -Continue to monitor daily Cr, Dose meds for GFR<15 -Will plan to get AV access placed while here as well but let her get a treatment or 2 under her belt first -  will call [facility] tomorrow to see and order vein mapping    Acute on chronic combined CHF -Diuresis challenge yesterday. Dialysis/volume management plan as noted above. HD today.  Chest pain -Per cardiology and primary.   Paroxysmal Afib -On amio 200 mg daily, metoprolol 25 mg bid -Eliquis on hold d/t dialysis catheter placement today, was on heparin in the interim  Anemia of chronic disease -Transfuse for Hb<7 -Hb 9.5 today (was 10.7 on 7/19), iron deficit calculated to be ~1.2 g -S/p 250 mg IV ferrlecit on 7/24; will receive 3 more doses -Aranesp 60 mg weekly  Will check PTH at some point-  phos is not bad-  no binder   Subjective:    No acute events overnight. This morning, the patient had her temporary access placed (right IJ tunneled HD cath). She states that the procedure went well and she is not in any pain. She is eager and anxious to start dialysis today. She continues to produce urine, denies dysuria/hematuria and states that her urine output increased with the diuretics she received.   Objective Vital signs in last 24 hours: Vitals:   03/14/22 0845 03/14/22 0850 03/14/22 0905 03/14/22 0922   BP: (!) 149/75 (!) 150/76 (!) 159/69 116/61  Pulse: 81 85 85 83  Resp: (!) 22 (!) 23 (!) 22 19  Temp:    97.8 F (36.6 C)  TempSrc:    Oral  SpO2: 100% 100% 99% 100%  Weight:      Height:       Weight change: 1.5 kg  Intake/Output Summary (Last 24 hours) at 03/14/2022 1121 Last data filed at 03/14/2022 0700 Gross per 24 hour  Intake 920.1 ml  Output 1000 ml  Net -79.9 ml    Labs: Basic Metabolic Panel: Recent Labs  Lab 03/12/22 0758 03/13/22 0652 03/14/22 1004  NA 139 138 137  K 3.9 3.7 3.4*  CL 104 104 104  CO2 _0 GLUCOSE 109* 98 115*  BUN 58* 67* 71*  CREATININE 6.41* 6.78* 6.70*  CALCIUM 9.1 8.9 9.0  PHOS  --  6.1* 5.6*   Liver Function Tests: Recent Labs  Lab 03/12/22 0758 03/13/22 0652 03/14/22 1004  AST 24  --   --   ALT 21  --   --   ALKPHOS 122  --   --   BILITOT 0.6  --   --   PROT 7.3  --   --   ALBUMIN 3.2* 3.1* 3.3*   No results for input(s): "LIPASE", "AMYLASE" in the last 168 hours. No results for input(s): "AMMONIA" in the last 168 hours. CBC: Recent Labs  Lab 03/08/22 1503 03/13/22 6629 03/14/22 0213 03/14/22 1004  WBC 4.5 4.9 4.7 3.7*  HGB 10.7* 9.5* 9.2* 9.5*  HCT 34.6* 29.9* 29.2* 29.6*  MCV 84.0 81.9 83.2 83.6  PLT 184 153 152 146*   Cardiac Enzymes: No results for input(s): "CKTOTAL", "CKMB", "CKMBINDEX", "TROPONINI" in the last 168 hours. CBG: Recent Labs  Lab 03/13/22 1125 03/13/22 1554 03/13/22 2116 03/14/22 0729 03/14/22 1112  GLUCAP 90 187* 111* 123* 162*    Iron Studies:  Recent Labs    03/13/22 0652  IRON 63  TIBC 231*  FERRITIN 118   Studies/Results: IR Fluoro Guide CV Line Right  Result Date: 03/14/2022 INDICATION: 66 year old female referred for tunneled hemodialysis catheter EXAM: TUNNELED CENTRAL VENOUS HEMODIALYSIS CATHETER PLACEMENT WITH ULTRASOUND AND FLUOROSCOPIC GUIDANCE MEDICATIONS: 2 g Ancef. The antibiotic was given in an appropriate time interval prior to skin puncture.  ANESTHESIA/SEDATION: Moderate (conscious) sedation was employed during this procedure. A total of Versed 1.0 mg and Fentanyl 25 mcg was administered intravenously by the radiology nurse. Total intra-service moderate Sedation Time: 15 minutes. The patient's level of consciousness and vital signs were monitored continuously by radiology nursing throughout the procedure under my direct supervision. FLUOROSCOPY: Radiation Exposure Index (as provided by the fluoroscopic device): 2 mGy Kerma COMPLICATIONS: None PROCEDURE: Informed written consent was obtained from the patient after a discussion of the risks, benefits, and alternatives to treatment. Questions regarding the procedure were encouraged and answered. The right neck and chest were prepped with chlorhexidine in a sterile fashion, and a sterile drape was applied covering the operative field. Maximum barrier sterile technique with sterile gowns and gloves were used for the procedure. A timeout was performed prior to the initiation of the procedure. Ultrasound survey was performed. The right internal jugular vein was confirmed to be patent, with images stored and sent to PACS. Micropuncture kit was utilized to access the right internal jugular vein under direct, real-time ultrasound guidance after the overlying soft tissues were anesthetized with 1% lidocaine with epinephrine. Stab incision was made with 11 blade scalpel. Microwire was passed centrally. The microwire was then marked to measure appropriate internal catheter length. External tunneled length was estimated. A total tip to cuff length of 19 cm was selected. 035 guidewire was advanced to the level of the IVC. Skin and subcutaneous tissues of chest wall below the clavicle were generously infiltrated with 1% lidocaine for local anesthesia. A small stab incision was made with 11 blade scalpel. The selected hemodialysis catheter was tunneled in a retrograde fashion from the anterior chest wall to the venotomy  incision. Serial dilation was performed and then a peel-away sheath was placed. The catheter was then placed through the peel-away sheath with tips ultimately positioned within the superior aspect of the right atrium. Final catheter positioning was confirmed and documented with a spot radiographic image. The catheter aspirates and flushes normally. The catheter was flushed with appropriate volume heparin dwells. The catheter exit site was secured with a 0-Prolene retention suture. Gel-Foam slurry was infused into the soft tissue tract. The venotomy incision was closed Derma bond and sterile dressing. Dressings were applied at the chest wall. Patient tolerated the procedure well and remained hemodynamically stable throughout. No complications were encountered and no significant blood loss encountered. IMPRESSION: Status post right IJ tunneled hemodialysis catheter Signed, Dulcy Fanny. Nadene Rubins, RPVI [facility] Radiology Electronically Signed   By: Corrie Mckusick D.O.   On: 03/14/2022 10:34   IR US Guide Vasc Access Right  Result Date: 03/14/2022 INDICATION: 66 year old female  referred for tunneled hemodialysis catheter EXAM: TUNNELED CENTRAL VENOUS HEMODIALYSIS CATHETER PLACEMENT WITH ULTRASOUND AND FLUOROSCOPIC GUIDANCE MEDICATIONS: 2 g Ancef. The antibiotic was given in an appropriate time interval prior to skin puncture. ANESTHESIA/SEDATION: Moderate (conscious) sedation was employed during this procedure. A total of Versed 1.0 mg and Fentanyl 25 mcg was administered intravenously by the radiology nurse. Total intra-service moderate Sedation Time: 15 minutes. The patient's level of consciousness and vital signs were monitored continuously by radiology nursing throughout the procedure under my direct supervision. FLUOROSCOPY: Radiation Exposure Index (as provided by the fluoroscopic device): 2 mGy Kerma COMPLICATIONS: None PROCEDURE: Informed written consent  was obtained from the patient after a discussion of the risks, benefits, and alternatives to treatment. Questions regarding the procedure were encouraged and answered. The right neck and chest were prepped with chlorhexidine in a sterile fashion, and a sterile drape was applied covering the operative field. Maximum barrier sterile technique with sterile gowns and gloves were used for the procedure. A timeout was performed prior to the initiation of the procedure. Ultrasound survey was performed. The right internal jugular vein was confirmed to be patent, with images stored and sent to PACS. Micropuncture kit was utilized to access the right internal jugular vein under direct, real-time ultrasound guidance after the overlying soft tissues were anesthetized with 1% lidocaine with epinephrine. Stab incision was made with 11 blade scalpel. Microwire was passed centrally. The microwire was then marked to measure appropriate internal catheter length. External tunneled length was estimated. A total tip to cuff length of 19 cm was selected. 035 guidewire was advanced to the level of the IVC. Skin and subcutaneous tissues of chest wall below the clavicle were generously infiltrated with 1% lidocaine for local anesthesia. A small stab incision was made with 11 blade scalpel. The selected hemodialysis catheter was tunneled in a retrograde fashion from the anterior chest wall to the venotomy incision. Serial dilation was performed and then a peel-away sheath was placed. The catheter was then placed through the peel-away sheath with tips ultimately positioned within the superior aspect of the right atrium. Final catheter positioning was confirmed and documented with a spot radiographic image. The catheter aspirates and flushes normally. The catheter was flushed with appropriate volume heparin dwells. The catheter exit site was secured with a 0-Prolene retention suture. Gel-Foam slurry was infused into the soft tissue tract. The  venotomy incision was closed Derma bond and sterile dressing. Dressings were applied at the chest wall. Patient tolerated the procedure well and remained hemodynamically stable throughout. No complications were encountered and no significant blood loss encountered. IMPRESSION: Status post right IJ tunneled hemodialysis catheter Signed, Dulcy Fanny. Nadene Rubins, RPVI [facility] Radiology Electronically Signed   By: Corrie Mckusick D.O.   On: 03/14/2022 10:34   ECHOCARDIOGRAM LIMITED  Result Date: 03/13/2022    ECHOCARDIOGRAM LIMITED REPORT   Patient Name:   [name] Date of Exam: 03/13/2022 Medical Rec #:  [id number]      Height:       67.0 in Accession #:    [id number]     Weight:       280.9 lb Date of Birth:  [birth year]      BSA:          2.336 m Patient Age:    3 years       BP:           109/64 mmHg Patient Gender: F  HR:           75 bpm. Exam Location:  Inpatient Procedure: Limited Echo Indications:    Congestive heart failure  History:        Patient has prior history of Echocardiogram examinations, most                 recent 01/20/2022. CHF, COPD; Risk Factors:Diabetes, Hypertension                 and Sleep Apnea.  Sonographer:    Jefferey Pica Referring Phys: [id number] Lewis  1. Abnormal septal motion . Left ventricular ejection fraction, by estimation, is 50 to 55%. The left ventricle has low normal function. The left ventricle has no regional wall motion abnormalities.  2. Right ventricular systolic function is normal. The right ventricular size is normal.  3. Left atrial size was severely dilated.  4. Right atrial size was severely dilated.  5. Post repair with annuloplasty ring no doppler/color flow done. The mitral valve was not assessed. No evidence of mitral valve regurgitation. No evidence of mitral stenosis.  6. Post repair with ring no doppler/color flow done. Tricuspid valve regurgitation is not  demonstrated.  7. The aortic valve was not assessed. Aortic valve regurgitation is not visualized. No aortic stenosis is present.  8. The inferior vena cava is normal in size with greater than 50% respiratory variability, suggesting right atrial pressure of 3 mmHg. FINDINGS  Left Ventricle: Abnormal septal motion. Left ventricular ejection fraction, by estimation, is 50 to 55%. The left ventricle has low normal function. The left ventricle has no regional wall motion abnormalities. The left ventricular internal cavity size was normal in size. There is no left ventricular hypertrophy. Right Ventricle: The right ventricular size is normal. No increase in right ventricular wall thickness. Right ventricular systolic function is normal. Left Atrium: Left atrial size was severely dilated. Right Atrium: Right atrial size was severely dilated. Pericardium: There is no evidence of pericardial effusion. Mitral Valve: Post repair with annuloplasty ring no doppler/color flow done. The mitral valve was not assessed. No evidence of mitral valve stenosis. Tricuspid Valve: Post repair with ring no doppler/color flow done. The tricuspid valve is not assessed. Tricuspid valve regurgitation is not demonstrated. No evidence of tricuspid stenosis. Aortic Valve: The aortic valve was not assessed. Aortic valve regurgitation is not visualized. No aortic stenosis is present. Pulmonic Valve: The pulmonic valve was not assessed. Pulmonic valve regurgitation is not visualized. No evidence of pulmonic stenosis. Aorta: The aortic root is normal in size and structure. Venous: The inferior vena cava is normal in size with greater than 50% respiratory variability, suggesting right atrial pressure of 3 mmHg. IAS/Shunts: No atrial level shunt detected by color flow Doppler. LEFT VENTRICLE PLAX 2D LVIDd:         5.35 cm LVIDs:         3.70 cm LV PW:         1.05 cm LV IVS:        0.80 cm  LV Volumes (MOD) LV vol d, MOD A4C: 144.0 ml LV vol s, MOD A4C:  70.3 ml LV SV MOD A4C:     144.0 ml IVC IVC diam: 2.30 cm LEFT ATRIUM              Index        RIGHT ATRIUM           Index LA diam:  5.60 cm  2.40 cm/m   RA Area:     31.60 cm LA Vol (A2C):   109.0 ml 46.66 ml/m  RA Volume:   129.00 ml 55.22 ml/m LA Vol (A4C):   99.5 ml  42.59 ml/m LA Biplane Vol: 105.0 ml 44.94 ml/m   AORTA Ao Root diam: 3.10 cm Ao Asc diam:  3.00 cm Jenkins Rouge MD Electronically signed by Jenkins Rouge MD Signature Date/Time: 03/13/2022/10:21:28 AM    Final    US RENAL  Result Date: 03/12/2022 CLINICAL DATA:  Acute kidney injury. EXAM: RENAL / URINARY TRACT ULTRASOUND COMPLETE COMPARISON:  CT AP 03/08/2022 FINDINGS: Right Kidney: Renal measurements: 9.8 x 5.3 by 5.7 cm = volume: 154.7 mL. Echogenicity within normal limits. No mass or hydronephrosis visualized. Left Kidney: Renal measurements: 10.0 x 5.5 x 5.4 cm = volume: 153.0 mL. Echogenicity within normal limits. No mass or hydronephrosis visualized. Bladder: Appears normal for degree of bladder distention. Other: None. IMPRESSION: Normal renal sonogram. Electronically Signed   By: Kerby Moors M.D.   On: 03/12/2022 12:52    Medications: Infusions:  sodium chloride 10 mL/hr at 03/13/22 2216   anticoagulant sodium citrate     famotidine (PEPCID) IV 10 mg (03/13/22 2216)   ferric gluconate (FERRLECIT) IVPB 250 mg (03/13/22 1258)   heparin 800 Units/hr (03/14/22 1107)    Scheduled Medications:  amiodarone  200 mg Oral Daily   Chlorhexidine Gluconate Cloth  6 each Topical Q0600   darbepoetin (ARANESP) injection - NON-DIALYSIS  60 mcg Subcutaneous Q Mon-1800   insulin aspart  0-5 Units Subcutaneous QHS   insulin aspart  0-6 Units Subcutaneous TID WC   isosorbide mononitrate  15 mg Oral Daily   metoprolol succinate  25 mg Oral BID   polyethylene glycol  17 g Oral Daily   rosuvastatin  10 mg Oral Daily    have reviewed scheduled and prn medications.  Physical Exam: General: Resting comfortably, no acute  distress Heart: Regular rate, rhythm. No murmurs. Lungs: Normal respiratory effort. Clear to auscultation bilaterally. Abdomen: Soft, non-tender, obese abdomen Extremities: No pitting edema in lower extremities Neuro: Awake, alert, conversing appropriately. No focal deficits. Dialysis Access: right IJ tunneled HD cath    Buddy Duty, DO Internal Medicine Resident PGY-2 Pager: [phone] 03/14/2022,11:21 AM  LOS: 4 days   Patient seen and examined, agree with above note with above modifications. No new issues-  s/p TDC and for first HD today, second tomorrow-  will order vein map and call [facility] tomorrow-  check PTH tomorrow as well  Corliss Parish, MD 03/14/2022

## 2022-03-14 NOTE — Progress Notes (Signed)
New Dialysis Start   Patient identified as new dialysis start. Kidney Education packet assembled and given. Discussed the following items with patient:    Current medications and possible changes once started:  Discussed that patient's medications may change over time.  Ex; hypertension medications and diabetes medication.  Nephrologists will adjust as needed.  Fluid restrictions reviewed:  32 oz daily goal:  All liquids count; soups, ice, jello   Phosphorus and potassium: Handout given showing high potassium and phosphorus foods.  Alternative food and drink options given.  Family support:  no family present at bedside  Outpatient Clinic Resources:  Discussed roles of Outpatient clinic  staff and advised to make a list of needs, if any, to talk with outpatient staff if needed  Care plan schedule: Informed patient and family member of Care Plans in outpatient setting and to participate in the care plan.  An invitation would be given from outpatient clinic.   Dialysis Access Options:  Reviewed access options with patients. Discussed in detail about care at home with new AVG & AVF. Reviewed checking bruit and thrill. If dialysis catheter present, educated that patient could not take showers.  Catheter dressing changes were to be done by outpatient clinic staff only  Home therapy options:  Educated patient about home therapy options:  PD vs home hemo.  Patient stated she may be interested in PD in the future once she has moved to her new home.   Patient verbalized understanding. Will continue to round on patient during admission.    Lilia Argue, RN

## 2022-03-14 NOTE — Progress Notes (Signed)
ANTICOAGULATION CONSULT NOTE - Follow Up Consult  Pharmacy Consult for heparin Indication: atrial fibrillation  Labs: Recent Labs    03/12/22 0758 03/13/22 0652 03/13/22 1621 03/14/22 0213  HGB  --  9.5*  --  9.2*  HCT  --  29.9*  --  29.2*  PLT  --  153  --  152  APTT  --  104* >200* 99*  HEPARINUNFRC  --  >1.10*  --  >1.10*  CREATININE 6.41* 6.78*  --   --     Assessment: 65yo female therapeutic on heparin after rate change but still at high end of goal; no infusion issues or signs of bleeding per RN.  Goal of Therapy:  aPTT 66-102 seconds   Plan:  Will decrease heparin infusion by 1 units/kgABW/hr to 800 units/hr and check level in 8 hours.    Wynona Neat, PharmD, BCPS  03/14/2022,3:28 AM

## 2022-03-14 NOTE — Telephone Encounter (Signed)
Called to confirm/remind patient of their appointment at the [facility] on 03/15/22. However, patient stated she's in the hospital and a follow up appointment will be scheduled at later time.

## 2022-03-14 NOTE — Progress Notes (Signed)
.  ANTICOAGULATION CONSULT NOTE - Initial Consult  Pharmacy Consult for Apixaban Indication: atrial fibrillation  Allergies  Allergen Reactions   Bee Pollen Anaphylaxis and Other (See Comments)    UNCONFIRMED, but IS allergic to bee VENOM   Bee Venom Anaphylaxis   Sulfa Antibiotics Itching    Patient Measurements: Height: '5\' 7"'$  (170.2 cm) Weight: 127.4 kg (280 lb 13.9 oz) IBW/kg (Calculated) : 61.6   Vital Signs: Temp: 97.8 F (36.6 C) (07/25 0922) Temp Source: Oral (07/25 0922) BP: 116/61 (07/25 0922) Pulse Rate: 83 (07/25 0922)  Labs: Recent Labs    03/12/22 0758 03/13/22 5427 03/13/22 0652 03/13/22 1621 03/14/22 0213 03/14/22 1004  HGB  --  9.5*   < >  --  9.2* 9.5*  HCT  --  29.9*  --   --  29.2* 29.6*  PLT  --  153  --   --  152 146*  APTT  --  104*   < > >200* 99* 41*  HEPARINUNFRC  --  >1.10*  --   --  >1.10*  --   CREATININE 6.41* 6.78*  --   --   --  6.70*   < > = values in this interval not displayed.    Estimated Creatinine Clearance: 11.6 mL/min (A) (by C-G formula based on SCr of 6.7 mg/dL (H)).   Medical History: Past Medical History:  Diagnosis Date   Acute on chronic systolic CHF (congestive heart failure) (HCC) 12/21/2020   Allergic rhinitis    Arthritis    Asthma    Chronic diastolic CHF (congestive heart failure) (HCC)    COPD (chronic obstructive pulmonary disease) (HCC)    Depression    DM (diabetes mellitus) ([place])    DVT (deep vein thrombosis) in pregnancy    GERD (gastroesophageal reflux disease)    HTN (hypertension)    Hyperlipidemia    Obesity    Pneumonia 04/2018   RIGHT LOBE   Sleep apnea    compliant with CPAP     Assessment: 66 YO F with persistent afib previously on heparin. Patient on apixaban 5 mg PO BID PTA. CHA2DS2/VAS of 6. Pharmacy consulted to restart dose of apixaban.    Afib dosing criteria: wt > 60kg, age < 65, SCr >1.5. Based upon dosing criteria, 5 mg dose is appropriate.      Goal of Therapy:   Monitor platelets by anticoagulation protocol: Yes   Plan:  Heparin infusion discontinued  Initiate apixaban 5 mg PO BID  Monitor CBC and SCr weekly  Monitor H&H and for s/sx bleeding    Gena Fray, PharmD PGY1 Pharmacy Resident   03/14/2022 2:32 PM

## 2022-03-14 NOTE — Progress Notes (Signed)
Pt's referral submitted to [facility] admissions for review and clinic placement.   Melven Sartorius Renal Navigator [phone]

## 2022-03-14 NOTE — Procedures (Signed)
Interventional Radiology Procedure Note  Procedure: Placement of a right IJ approach tunneled HD cath, 19cm.  Tip is positioned at the superior cavoatrial junction and catheter is ready for immediate use.  Complications: None Recommendations:  - Ok to use - Do not submerge - Routine line care  - ok to restart heparin ggt in 2 hrs  Signed,  Dulcy Fanny. Earleen Newport, DO

## 2022-03-14 NOTE — Progress Notes (Signed)
Received patient in bed, alert and oriented. Informed consent signed and in chart.  Time tx completed:1734  HD treatment completed. Patient tolerated well. HD catheter without signs and symptoms of complications. Patient transported back to the room, alert and orient and in no acute distress. Report given to bedside RN.  Total UF removed:700  Medication given:Ferrlecit  Post HD VS:124/79,80,20,98.6,100%  Post HD weight: 128.5kg

## 2022-03-14 NOTE — Discharge Instructions (Addendum)
[facility]  Discharge Instructions  AV Fistula or Graft Surgery for Dialysis Access  Please refer to the following instructions for your post-procedure care. Your surgeon or physician assistant will discuss any changes with you.  Activity  You may drive the day following your surgery, if you are comfortable and no longer taking prescription pain medication. Resume full activity as the soreness in your incision resolves.  Bathing/Showering  You may shower after you go home. Keep your incision dry for 48 hours. Do not soak in a bathtub, hot tub, or swim until the incision heals completely. You may not shower if you have a hemodialysis catheter.  Incision Care  Clean your incision with mild soap and water after 48 hours. Pat the area dry with a clean towel. You do not need a bandage unless otherwise instructed. Do not apply any ointments or creams to your incision. You may have skin glue on your incision. Do not peel it off. It will come off on its own in about one week. Your arm may swell a bit after surgery. To reduce swelling use pillows to elevate your arm so it is above your heart. Your doctor will tell you if you need to lightly wrap your arm with an ACE bandage.  Diet  Resume your normal diet. There are not special food restrictions following this procedure. In order to heal from your surgery, it is CRITICAL to get adequate nutrition. Your body requires vitamins, minerals, and protein. Vegetables are the best source of vitamins and minerals. Vegetables also provide the perfect balance of protein. Processed food has little nutritional value, so try to avoid this.  Medications  Resume taking all of your medications. If your incision is causing pain, you may take over-the counter pain relievers such as acetaminophen (Tylenol). If you were prescribed a stronger pain medication, please be aware these medications can cause nausea and constipation. Prevent  nausea by taking the medication with a snack or meal. Avoid constipation by drinking plenty of fluids and eating foods with high amount of fiber, such as fruits, vegetables, and grains. Do not take Tylenol if you are taking prescription pain medications.     Follow up Your surgeon may want to see you in the office following your access surgery. If so, this will be arranged at the time of your surgery.  Please call us immediately for any of the following conditions:  Increased pain, redness, drainage (pus) from your incision site Fever of 101 degrees or higher Severe or worsening pain at your incision site Hand pain or numbness.  Reduce your risk of vascular disease:  Stop smoking. If you would like help, call [facility] at 1-800-QUIT-NOW [phone]) or Jennette at [place] your cholesterol Maintain a desired weight Control your diabetes Keep your blood pressure down  Dialysis  It will take several weeks to several months for your new dialysis access to be ready for use. Your surgeon will determine when it is OK to use it. Your nephrologist will continue to direct your dialysis. You can continue to use your Permcath until your new access is ready for use.  If you have any questions, please call the office at [phone].      Information on my medicine - ELIQUIS (apixaban)  Why was Eliquis prescribed for you? Eliquis was prescribed for you to reduce the risk of a blood clot forming that can cause a stroke if you have a medical condition called atrial fibrillation (  a type of irregular heartbeat).  What do You need to know about Eliquis ? Take your Eliquis TWICE DAILY - one tablet in the morning and one tablet in the evening with or without food. If you have difficulty swallowing the tablet whole please discuss with your pharmacist how to take the medication safely.  Take Eliquis exactly as prescribed by your doctor and DO NOT stop taking Eliquis  without talking to the doctor who prescribed the medication.  Stopping may increase your risk of developing a stroke.  Refill your prescription before you run out.  After discharge, you should have regular check-up appointments with your healthcare provider that is prescribing your Eliquis.  In the future your dose may need to be changed if your kidney function or weight changes by a significant amount or as you get older.  What do you do if you miss a dose? If you miss a dose, take it as soon as you remember on the same day and resume taking twice daily.  Do not take more than one dose of ELIQUIS at the same time to make up a missed dose.  Important Safety Information A possible side effect of Eliquis is bleeding. You should call your healthcare provider right away if you experience any of the following: Bleeding from an injury or your nose that does not stop. Unusual colored urine (red or dark brown) or unusual colored stools (red or black). Unusual bruising for unknown reasons. A serious fall or if you hit your head (even if there is no bleeding).  Some medicines may interact with Eliquis and might increase your risk of bleeding or clotting while on Eliquis. To help avoid this, consult your healthcare provider or pharmacist prior to using any new prescription or non-prescription medications, including herbals, vitamins, non-steroidal anti-inflammatory drugs (NSAIDs) and supplements.  This website has more information on Eliquis (apixaban): http://www.eliquis.com/eliquis/home

## 2022-03-14 NOTE — Progress Notes (Signed)
ANTICOAGULATION CONSULT NOTE - Follow Up Consult  Pharmacy Consult for heparin Indication: atrial fibrillation   Patient Measurements: Height: '5\' 7"'$  (170.2 cm) Weight: 127.4 kg (280 lb 13.9 oz) IBW/kg (Calculated) : 61.6 Heparin Dosing Weight: 92 kg    Labs: Recent Labs    03/12/22 0758 03/13/22 0652 03/13/22 0652 03/13/22 1621 03/14/22 0213 03/14/22 1004  HGB  --  9.5*   < >  --  9.2* 9.5*  HCT  --  29.9*  --   --  29.2* 29.6*  PLT  --  153  --   --  152 146*  APTT  --  104*   < > >200* 99* 41*  HEPARINUNFRC  --  >1.10*  --   --  >1.10*  --   CREATININE 6.41* 6.78*  --   --   --  6.70*   < > = values in this interval not displayed.    Assessment: [name] on apixaban for PAF. Transitioned from apixaban to heparin for procedures. Apixaban has been affecting heparin levels so have been utilizing aPTT for monitoring until levels are correlating.  Heparin last rate at 800 units/hr. Heparin held at 0723 for interventional radiology placement of tunneled HD cath. Per radiology ok to restart heparin 2 hrs s/p procedure. Infusion has been restarted at 1107 at 800 units/hr rate. Confirmed with RN that heparin was turned off during placement. No s/sx of bleeding per RN.    aPTT collected at 1004 while heparin infusion held. aPTT this AM 41 and is subtherapeutic. Will retime heparin levels since infusion was turned off for an extended period of time. Given recent supratherapeutic aPTT, will continue with current rate.   Goal of Therapy:  aPTT 66-102 seconds   Plan:  Continue heparin infusion 800 units/hr  Obtain aPTT level in 8 hours Monitor daily aPTT and CBC  Continue to monitor H&H      [name], PharmD PGY1 Pharmacy Resident   03/14/2022 12:06 PM

## 2022-03-14 NOTE — Progress Notes (Signed)
Mobility Specialist Progress Note    03/14/22 1542  Mobility  Activity Off unit   Pt at HD. Will f/u as appropriate.   Hildred Alamin Mobility Specialist

## 2022-03-15 ENCOUNTER — Encounter (HOSPITAL_COMMUNITY): Payer: 59

## 2022-03-15 ENCOUNTER — Inpatient Hospital Stay (HOSPITAL_COMMUNITY): Payer: 59

## 2022-03-15 DIAGNOSIS — I5043 Acute on chronic combined systolic (congestive) and diastolic (congestive) heart failure: Secondary | ICD-10-CM | POA: Diagnosis not present

## 2022-03-15 DIAGNOSIS — I10 Essential (primary) hypertension: Secondary | ICD-10-CM

## 2022-03-15 DIAGNOSIS — R079 Chest pain, unspecified: Secondary | ICD-10-CM | POA: Diagnosis not present

## 2022-03-15 DIAGNOSIS — N186 End stage renal disease: Secondary | ICD-10-CM

## 2022-03-15 DIAGNOSIS — Z992 Dependence on renal dialysis: Secondary | ICD-10-CM

## 2022-03-15 DIAGNOSIS — J449 Chronic obstructive pulmonary disease, unspecified: Secondary | ICD-10-CM

## 2022-03-15 DIAGNOSIS — I480 Paroxysmal atrial fibrillation: Secondary | ICD-10-CM | POA: Diagnosis not present

## 2022-03-15 LAB — BASIC METABOLIC PANEL
Anion gap: 10 (ref 5–15)
BUN: 58 mg/dL — ABNORMAL HIGH (ref 8–23)
CO2: 22 mmol/L (ref 22–32)
Calcium: 8.6 mg/dL — ABNORMAL LOW (ref 8.9–10.3)
Chloride: 105 mmol/L (ref 98–111)
Creatinine, Ser: 5.47 mg/dL — ABNORMAL HIGH (ref 0.44–1.00)
GFR, Estimated: 8 mL/min — ABNORMAL LOW (ref >60)
Glucose, Bld: 146 mg/dL — ABNORMAL HIGH (ref 70–99)
Potassium: 3.5 mmol/L (ref 3.5–5.1)
Sodium: 137 mmol/L (ref 135–145)

## 2022-03-15 LAB — CBC
HCT: 27 % — ABNORMAL LOW (ref 36.0–46.0)
Hemoglobin: 8.9 g/dL — ABNORMAL LOW (ref 12.0–15.0)
MCH: 27.3 pg (ref 26.0–34.0)
MCHC: 33 g/dL (ref 30.0–36.0)
MCV: 82.8 fL (ref 80.0–100.0)
Platelets: 125 10*3/uL — ABNORMAL LOW (ref 150–400)
RBC: 3.26 MIL/uL — ABNORMAL LOW (ref 3.87–5.11)
RDW: 19.2 % — ABNORMAL HIGH (ref 11.5–15.5)
WBC: 5.3 10*3/uL (ref 4.0–10.5)
nRBC: 0 % (ref 0.0–0.2)

## 2022-03-15 LAB — GLUCOSE, CAPILLARY
Glucose-Capillary: 114 mg/dL — ABNORMAL HIGH (ref 70–99)
Glucose-Capillary: 86 mg/dL (ref 70–99)
Glucose-Capillary: 132 mg/dL — ABNORMAL HIGH (ref 70–99)
Glucose-Capillary: 156 mg/dL — ABNORMAL HIGH (ref 70–99)

## 2022-03-15 LAB — HEPATITIS B SURFACE ANTIBODY, QUANTITATIVE: Hep B S AB Quant (Post): <3.1 m[IU]/mL — ABNORMAL LOW (ref >9.9)

## 2022-03-15 MED ORDER — HEPARIN SODIUM (PORCINE) 1000 UNIT/ML IJ SOLN
INTRAMUSCULAR | Status: AC
  Filled 2022-03-15: qty 3

## 2022-03-15 MED ORDER — HYDRALAZINE HCL 10 MG PO TABS
10.0000 mg | ORAL_TABLET | Freq: Three times a day (TID) | ORAL | Status: DC
Start: 2022-03-15 — End: 2022-03-16
  Administered 2022-03-15 – 2022-03-16 (×3): 10 mg via ORAL
  Filled 2022-03-15 (×3): qty 1

## 2022-03-15 MED ORDER — FENTANYL CITRATE PF 50 MCG/ML IJ SOSY
25.0000 ug | PREFILLED_SYRINGE | INTRAMUSCULAR | Status: AC | PRN
  Administered 2022-03-15 – 2022-03-17 (×2): 50 ug via INTRAVENOUS
  Filled 2022-03-15 (×2): qty 1

## 2022-03-15 MED ORDER — HEPARIN SODIUM (PORCINE) 1000 UNIT/ML IJ SOLN
INTRAMUSCULAR | Status: AC
  Administered 2022-03-15: 4000 [IU]
  Filled 2022-03-15: qty 1

## 2022-03-15 NOTE — Care Management Important Message (Signed)
Important Message  Patient Details  Name: [name] MRN: [id number] Date of Birth: 1955/09/20   Medicare Important Message Given:        Shelda Altes 03/15/2022, 11:47 AM

## 2022-03-15 NOTE — Procedures (Signed)
Patient was seen on dialysis and the procedure was supervised.  BFR 300  Via TDC BP is  124/61.   Patient appears to be tolerating treatment well  Louis Meckel 03/15/2022

## 2022-03-15 NOTE — Progress Notes (Addendum)
Contacted [facility] admissions to request update on pt's referral/clinic placement. Awaiting response.   Melven Sartorius Renal Navigator [phone]  Addendum at 3:00 pm: Pt's case is being reviewed by [facility]. Awaiting final approval/clearance.

## 2022-03-15 NOTE — Consult Note (Addendum)
Hospital Consult    Reason for Consult:  permanent dialysis access Requesting Physician:  Corliss Parish MD MRN #:  [id number]  History of Present Illness: [name] is a 66 y.o. female with a PMH of chronic biventricular systolic CHF, s/p mitral and tricuspid valve replacement, paroxysmal a-fib s/p maze procedure, CAD, DM2, CKD stage IV, OSA, COPD, hypertension, and hyperlipidemia. She was admitted to the hospital on 03/08/2022 with chest pain and shortness of breath, due to acute exacerbation of CHF. Nephrology was consulted on 03/12/2022 due to worsening renal function and progression to ESRD. Patient then underwent placement of right IJ tunneled catheter for hemodialysis by IR on 03/14/2022. She then had her first dialysis session. Today she goes for a second dialysis session.  Vascular has been consulted for permanent hemodialysis access with fistula/graft placement. The patient is feeling well today and about to go to her second dialysis session. The patient is right hand dominant. She does not have any questions about the surgery. She is open to the surgery being performed while she is in the hospital.  Past Medical History:  Diagnosis Date   Acute on chronic systolic CHF (congestive heart failure) (HCC) 12/21/2020   Allergic rhinitis    Arthritis    Asthma    Chronic diastolic CHF (congestive heart failure) (HCC)    COPD (chronic obstructive pulmonary disease) (HCC)    Depression    DM (diabetes mellitus) ([place])    DVT (deep vein thrombosis) in pregnancy    GERD (gastroesophageal reflux disease)    HTN (hypertension)    Hyperlipidemia    Obesity    Pneumonia 04/2018   RIGHT LOBE   Sleep apnea    compliant with CPAP    Past Surgical History:  Procedure Laterality Date   ABDOMINAL HYSTERECTOMY     BUBBLE STUDY  11/26/2020   Procedure: BUBBLE STUDY;  Surgeon: Buford Dresser, MD;  Location: [place];  Service: Cardiovascular;;   CARDIOVERSION N/A 05/06/2020    Procedure: CARDIOVERSION;  Surgeon: Buford Dresser, MD;  Location: [facility] ENDOSCOPY;  Service: Cardiovascular;  Laterality: N/A;   CARDIOVERSION N/A 11/04/2020   Procedure: CARDIOVERSION;  Surgeon: Lelon Perla, MD;  Location: [place];  Service: Cardiovascular;  Laterality: N/A;   CARDIOVERSION N/A 02/01/2021   Procedure: CARDIOVERSION;  Surgeon: Jolaine Artist, MD;  Location: [place];  Service: Cardiovascular;  Laterality: N/A;   CARDIOVERSION N/A 02/09/2021   Procedure: CARDIOVERSION;  Surgeon: Jolaine Artist, MD;  Location: [place];  Service: Cardiovascular;  Laterality: N/A;   CARDIOVERSION N/A 04/19/2021   Procedure: CARDIOVERSION;  Surgeon: Fay Records, MD;  Location: [place];  Service: Cardiovascular;  Laterality: N/A;   CARDIOVERSION N/A 11/25/2021   Procedure: CARDIOVERSION;  Surgeon: Jolaine Artist, MD;  Location: [place];  Service: Cardiovascular;  Laterality: N/A;   CARDIOVERSION N/A 01/31/2022   Procedure: CARDIOVERSION;  Surgeon: Jolaine Artist, MD;  Location: [place];  Service: Cardiovascular;  Laterality: N/A;   CATARACT EXTRACTION, BILATERAL     CHOLECYSTECTOMY     COLONOSCOPY WITH PROPOFOL N/A 03/05/2015   Procedure: COLONOSCOPY WITH PROPOFOL;  Surgeon: Carol Ada, MD;  Location: [facility] ENDOSCOPY;  Service: Endoscopy;  Laterality: N/A;   DIAGNOSTIC LAPAROSCOPY     ingrown hallux Left    IR FLUORO GUIDE CV LINE RIGHT  03/14/2022   IR US GUIDE VASC ACCESS RIGHT  03/14/2022   KNEE SURGERY     LEFT HEART CATH AND CORONARY ANGIOGRAPHY N/A 12/31/2017   Procedure: LEFT HEART  CATH AND CORONARY ANGIOGRAPHY;  Surgeon: Charolette Forward, MD;  Location: [place] CV LAB;  Service: Cardiovascular;  Laterality: N/A;   MAZE N/A 12/29/2020   Procedure: MAZE;  Surgeon: [name], MD;  Location: [place];  Service: Open Heart Surgery;  Laterality: N/A;   MITRAL VALVE REPAIR N/A 12/29/2020   Procedure: MITRAL VALVE REPAIR USING  CARBOMEDICS ANNULOFLEX RING SIZE 30;  Surgeon: [name], MD;  Location: [place];  Service: Open Heart Surgery;  Laterality: N/A;   RIGHT HEART CATH N/A 12/22/2020   Procedure: RIGHT HEART CATH;  Surgeon: Larey Dresser, MD;  Location: [place] CV LAB;  Service: Cardiovascular;  Laterality: N/A;   RIGHT HEART CATH N/A 01/31/2021   Procedure: RIGHT HEART CATH;  Surgeon: Jolaine Artist, MD;  Location: [place] CV LAB;  Service: Cardiovascular;  Laterality: N/A;   RIGHT HEART CATH N/A 07/29/2021   Procedure: RIGHT HEART CATH;  Surgeon: Jolaine Artist, MD;  Location: [place] CV LAB;  Service: Cardiovascular;  Laterality: N/A;   RIGHT HEART CATH N/A 11/24/2021   Procedure: RIGHT HEART CATH;  Surgeon: Jolaine Artist, MD;  Location: [place] CV LAB;  Service: Cardiovascular;  Laterality: N/A;   RIGHT HEART CATH N/A 02/14/2022   Procedure: RIGHT HEART CATH;  Surgeon: Jolaine Artist, MD;  Location: [place] CV LAB;  Service: Cardiovascular;  Laterality: N/A;   RIGHT HEART CATH N/A 02/22/2022   Procedure: RIGHT HEART CATH;  Surgeon: Larey Dresser, MD;  Location: [place] CV LAB;  Service: Cardiovascular;  Laterality: N/A;   TEE WITHOUT CARDIOVERSION N/A 11/26/2020   Procedure: TRANSESOPHAGEAL ECHOCARDIOGRAM (TEE);  Surgeon: Buford Dresser, MD;  Location: [facility]COPY;  Service: Cardiovascular;  Laterality: N/A;   TEE WITHOUT CARDIOVERSION N/A 12/29/2020   Procedure: TRANSESOPHAGEAL ECHOCARDIOGRAM (TEE);  Surgeon: [name], MD;  Location: [place];  Service: Open Heart Surgery;  Laterality: N/A;   TEE WITHOUT CARDIOVERSION N/A 01/20/2022   Procedure: TRANSESOPHAGEAL ECHOCARDIOGRAM (TEE);  Surgeon: Jolaine Artist, MD;  Location: [place];  Service: Cardiovascular;  Laterality: N/A;   TRICUSPID VALVE REPLACEMENT N/A 12/29/2020   Procedure: 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 RING SIZE 34;  Surgeon: [name], MD;   Location: [place];  Service: Open Heart Surgery;  Laterality: N/A;    Allergies  Allergen Reactions   Bee Pollen Anaphylaxis and Other (See Comments)    UNCONFIRMED, but IS allergic to bee VENOM   Bee Venom Anaphylaxis   Sulfa Antibiotics Itching    Prior to Admission medications   Medication Sig Start Date End Date Taking? Authorizing Provider  acetaminophen (TYLENOL) 325 MG tablet Take 2 tablets (650 mg total) by mouth every 6 (six) hours as needed for mild pain or moderate pain. 12/07/21  Yes Arrien, Jimmy Picket, MD  albuterol (VENTOLIN HFA) 108 (90 Base) MCG/ACT inhaler INHALE 1-2 PUFFS BY MOUTH EVERY 6 HOURS AS NEEDED FOR WHEEZE OR SHORTNESS OF BREATH Patient taking differently: Inhale 2 puffs into the lungs every 6 (six) hours as needed for wheezing or shortness of breath. 02/24/22  Yes Shelly Coss, MD  amiodarone (PACERONE) 200 MG tablet Take 1 tablet (200 mg total) by mouth daily. Patient taking differently: Take 200 mg by mouth 2 (two) times daily. 03/01/22  Yes Bensimhon, Shaune Pascal, MD  apixaban (ELIQUIS) 5 MG TABS tablet TAKE ONE TABLET BY MOUTH TWICE DAILY at [facility] Patient taking differently: Take 5 mg by mouth in the  morning and at bedtime. 02/24/22  Yes Adhikari, Tamsen Meek, MD  busPIRone (BUSPAR) 5 MG tablet TAKE 1 TABLET BY MOUTH TWICE A DAY Patient taking differently: Take 5 mg by mouth 2 (two) times daily. 02/26/22  Yes [name], NP  colchicine 0.6 MG tablet Take 0.5 tablets (0.3 mg total) by mouth daily. 12/08/21  Yes Charlott Rakes, MD  diphenhydrAMINE (BENADRYL) 25 mg capsule Take 25 mg by mouth every 6 (six) hours as needed for allergies or itching.   Yes [provider]  DULoxetine (CYMBALTA) 60 MG capsule TAKE 1 CAPSULE BY MOUTH EVERY DAY Patient taking differently: Take 60 mg by mouth daily. 02/26/22  Yes [name], NP  EPINEPHrine 0.3 mg/0.3 mL IJ SOAJ injection Inject 0.3 mg into the muscle as needed for anaphylaxis. 06/25/20  Yes [name], NP  fluticasone (FLONASE) 50 MCG/ACT nasal spray Place 2 sprays into both nostrils daily. Patient taking differently: Place 2 sprays into both nostrils daily as needed for allergies or rhinitis. 12/29/21  Yes [name], NP  insulin aspart (NOVOLOG) 100 UNIT/ML injection Inject 2-10 Units into the skin 3 (three) times daily before meals. Per sliding scale Patient taking differently: Inject 2-10 Units into the skin See admin instructions. Inject 2-10 units into the skin three times a day before meals, per sliding scale 09/02/21  Yes Alekh, Kshitiz, MD  lidocaine (XYLOCAINE) 5 % ointment Apply 1 application. topically as needed for moderate pain. First choice is lidocaine patch, but if not covered/expensive, please fill ointment Patient taking differently: Apply 1 application  topically 2 (two) times daily as needed for moderate pain. 11/08/21  Yes Buford Dresser, MD  LINZESS 72 MCG capsule Take 72 mcg by mouth every morning. 02/11/22  Yes [provider]  loratadine (CLARITIN) 10 MG tablet Take 1 tablet (10 mg total) by mouth daily. 12/29/21  Yes [name], NP  metolazone (ZAROXOLYN) 2.5 MG tablet Take 1 tablet (2.5 mg total) by mouth once a week. Monday.  Take extra tablet as needed for weight gain or lower extremity edema. Patient taking differently: Take 2.5 mg by mouth See admin instructions. Take 2.5 mg by mouth on Mondays and Wednesdays 02/24/22  Yes Adhikari, Tamsen Meek, MD  metoprolol succinate (TOPROL-XL) 25 MG 24 hr tablet TAKE 1 TABLET (25 MG) BY MOUTH IN THE MORNING AND AT BEDTIME Patient taking differently: Take 25 mg by mouth in the morning and at bedtime. 02/28/22  Yes Bensimhon, Shaune Pascal, MD  Multiple Vitamins-Minerals (MULTIVITAMIN WOMEN PO) Take 1 tablet by mouth daily.   Yes [provider]  nitroGLYCERIN (NITROSTAT) 0.4 MG SL tablet Place 1 tablet (0.4 mg total) under the tongue every 5 (five) minutes x 3 doses as needed for chest pain.  01/08/14  Yes Charolette Forward, MD  pantoprazole (PROTONIX) 40 MG tablet TAKE ONE TABLET BY MOUTH DAILY AT 9AM Patient taking differently: Take 40 mg by mouth daily before breakfast. 01/20/22  Yes [name], NP  polyethylene glycol powder (GLYCOLAX/MIRALAX) 17 GM/SCOOP powder Take 17 g by mouth daily. Patient taking differently: Take 17 g by mouth daily as needed for mild constipation. 02/25/22  Yes Adhikari, Tamsen Meek, MD  potassium chloride SA (KLOR-CON M) 20 MEQ tablet Take 3 tablets (60 mEq total) by mouth daily. Take an additional 20 meq with every dose of METOLAZONE Patient taking differently: Take 40 mEq by mouth 3 (three) times daily. 01/27/22  Yes Joette Catching, PA-C  pregabalin (LYRICA) 100 MG capsule TAKE  1 CAPSULE BY MOUTH THREE TIMES A DAY Patient taking differently: Take 100 mg by mouth 3 (three) times daily as needed (for pain). 02/26/22  Yes [name], NP  RESTASIS 0.05 % ophthalmic emulsion Place 1 drop into both eyes in the morning and at bedtime. 11/19/17  Yes [provider]  rosuvastatin (CRESTOR) 10 MG tablet Take 1 tablet (10 mg total) by mouth every evening. Patient taking differently: Take 10 mg by mouth daily. 09/02/21  Yes Aline August, MD  senna-docusate (SENOKOT-S) 8.6-50 MG tablet Take 1 tablet by mouth 2 (two) times daily. 02/24/22  Yes Shelly Coss, MD  torsemide (DEMADEX) 20 MG tablet Take 4 tablets (80 mg total) by mouth daily. Can take an additional 2 tablets as needed for increased weight gain Patient taking differently: Take 60 mg by mouth in the morning. 02/28/22  Yes Larey Dresser, MD  TRULICITY 1.5 MW/1.0UV SOPN INJECT 1.5 MG INTO THE SKIN ONCE A WEEK. Patient taking differently: Inject 1.5 mg into the skin every Wednesday. 02/26/22  Yes [name], NP    Social History   Socioeconomic History   Marital status: Significant Other    Spouse name: Not on file   Number of children: 2   Years of education: Not on file    Highest education level: Some college, no degree  Occupational History   Not on file  Tobacco Use   Smoking status: Never   Smokeless tobacco: Never  Vaping Use   Vaping Use: Never used  Substance and Sexual Activity   Alcohol use: No   Drug use: No   Sexual activity: Not Currently  Other Topics Concern   Not on file  Social History Narrative   Pt lives in [place] with spouse.  2 grown children.   Previously worked in [employer] at [employer].  Now on disability   Social Determinants of Health   Financial Resource Strain: Low Risk  (01/19/2022)   Overall Financial Resource Strain (CARDIA)    Difficulty of Paying Living Expenses: Not hard at all  Food Insecurity: No Food Insecurity (01/19/2022)   Hunger Vital Sign    Worried About Running Out of Food in the Last Year: Never true    Ran Out of Food in the Last Year: Never true  Transportation Needs: No Transportation Needs (01/19/2022)   PRAPARE - Hydrologist (Medical): No    Lack of Transportation (Non-Medical): No  Physical Activity: Insufficiently Active (10/28/2021)   Exercise Vital Sign    Days of Exercise per Week: 2 days    Minutes of Exercise per Session: 70 min  Stress: No Stress Concern Present (10/28/2021)   [place]    Feeling of Stress : Not at all  Social Connections: Socially Isolated (10/28/2021)   Social Connection and Isolation Panel [NHANES]    Frequency of Communication with Friends and Family: More than three times a week    Frequency of Social Gatherings with Friends and Family: Three times a week    Attends Religious Services: Never    Active Member of Clubs or Organizations: No    Attends Archivist Meetings: Never    Marital Status: Widowed  Intimate Partner Violence: Not At Risk (10/28/2021)   Humiliation, Afraid, Rape, and Kick questionnaire    Fear of Current or Ex-Partner: No    Emotionally Abused:  No    Physically Abused: No    Sexually  Abused: No     Family History  Problem Relation Age of Onset   Breast cancer Mother    Cancer Mother    Hypertension Mother    Cancer Father    Hypertension Sister    Hypertension Brother    CAD Other     ROS: Otherwise negative unless mentioned in HPI  Physical Examination  Vitals:   03/15/22 0443 03/15/22 0826  BP: 127/62 (!) 115/96  Pulse: 84   Resp: 16   Temp: 97.7 F (36.5 C)   SpO2:     Body mass index is 44.25 kg/m.  General:  WDWN in NAD Gait: Not observed HENT: WNL, normocephalic Pulmonary: normal non-labored breathing, without Rales, rhonchi,  wheezing Cardiac: regular, without murmurs, rubs or gallops Abdomen: soft, NT/ND, no masses Skin: without rashes Vascular Exam/Pulses: Palpable radial and brachial pulses bilaterally, 2+ Extremities: with ischemic changes, without Gangrene , without cellulitis; without open wounds;  Musculoskeletal: no muscle wasting or atrophy  Neurologic: A&O X 3;  No focal weakness or paresthesias are detected; speech is fluent/normal Psychiatric:  The pt has Normal affect. Lymph:  Unremarkable  CBC    Component Value Date/Time   WBC 5.3 03/15/2022 0200   RBC 3.26 (L) 03/15/2022 0200   HGB 8.9 (L) 03/15/2022 0200   HGB 10.5 (L) 04/14/2021 0957   HCT 27.0 (L) 03/15/2022 0200   HCT 32.5 (L) 04/14/2021 0957   PLT 125 (L) 03/15/2022 0200   PLT 216 04/14/2021 0957   MCV 82.8 03/15/2022 0200   MCV 85 04/14/2021 0957   MCH 27.3 03/15/2022 0200   MCHC 33.0 03/15/2022 0200   RDW 19.2 (H) 03/15/2022 0200   RDW 15.3 04/14/2021 0957   LYMPHSABS 1.8 11/22/2021 0244   LYMPHSABS 2.6 10/07/2019 1337   MONOABS 1.4 (H) 11/22/2021 0244   EOSABS 0.2 11/22/2021 0244   EOSABS 0.2 10/07/2019 1337   BASOSABS 0.1 11/22/2021 0244   BASOSABS 0.0 10/07/2019 1337    BMET    Component Value Date/Time   NA 137 03/15/2022 0200   NA 142 01/11/2022 1134   K 3.5 03/15/2022 0200   CL 105 03/15/2022  0200   CO2 22 03/15/2022 0200   GLUCOSE 146 (H) 03/15/2022 0200   BUN 58 (H) 03/15/2022 0200   BUN 20 01/11/2022 1134   CREATININE 5.47 (H) 03/15/2022 0200   CALCIUM 8.6 (L) 03/15/2022 0200   GFRNONAA 8 (L) 03/15/2022 0200   GFRAA 33 (L) 09/08/2020 1054    COAGS: Lab Results  Component Value Date   INR 2.3 (H) 11/24/2021   INR 1.9 (H) 09/19/2021   INR 2.3 (H) 02/28/2021     Non-Invasive Vascular Imaging:   Vein mapping studies ordered  Statin:  Yes.   Beta Blocker:  Yes.   Aspirin:  No. ACEI:  No. ARB:  No. CCB use:  No Other antiplatelets/anticoagulants:  Yes.   Eliquis   ASSESSMENT/PLAN: This is a 66 y.o. female who is in need of permanent hemodialysis access   -Per cardiology, patient is at good standpoint for inpatient surgery while she is here -Patient is undergoing second dialysis session today via right IJ TDC. First session was 03/14/2022 post [facility] placement by IR. -Vein mapping studies have been ordered. Patient is right handed, will attempt to place fistula/graft in left arm if possible. -Procedure has been explained to patient with all risks and benefits being answered. All questions answered.  -Patient is agreeable to fistula/graft placement potentially this Friday. -Will begin to hold  Eliquis today to prep for surgery Friday.   Vicente Serene PA-C [facility] [phone]   I have seen and evaluated the patient. I agree with the PA note as documented above.  66 year old female that vascular surgery has been consulted for permanent hemodialysis access.  Seen in dialysis using a right IJ TDC today.  Admitted with acute on chronic heart failure as well as acute kidney injury on CKD stage IV.  Also has valvular heart disease status post mitral and tricuspid valve repair and A-fib on Eliquis.  Patient is right-handed.  No previous access.  I have discussed with cardiology who feels that she can proceed to the OR this admission.  We have held her  Eliquis this morning and she will need 48-hour washout.  Tentatively posted for left arm AV fistula versus graft on Friday with Dr. Scot Dock.  Discussed will use left arm since non-dominant arm.  Vein mapping ordered and will follow-up arrangements.  Heparin bridge okay if needed.   Marty Heck, MD [facility] [place] Office: [phone]

## 2022-03-15 NOTE — Progress Notes (Signed)
Nutrition Education Note  RD consulted for Renal Education. Pt having ultrasound at time of visit. Discussed that RD would meet with her at out pt HD center to go over personalized nutrition plan. Did provide general renal diet education and focused on reducing sodium intake and eliminating dark sodas in diet.   Explained why diet restrictions are needed and provided lists of foods to limit/avoid that are high potassium, sodium, and phosphorus. Provided specific recommendations on safer alternatives of these foods. Strongly encouraged compliance of this diet.   Discussed importance of protein intake at each meal and snack. Provided examples of how to maximize protein intake throughout the day. Discussed need for fluid restriction with dialysis and renal-friendly beverage options.  Expect good compliance.  Labs and medications reviewed. No further nutrition interventions warranted at this time. RD contact information provided. If additional nutrition issues arise, please re-consult RD.   Ranell Patrick, RD, LDN Clinical Dietitian RD pager # available in [place]  After hours/weekend pager # available in [facility]

## 2022-03-15 NOTE — Progress Notes (Signed)
Bilateral upper extremity vein mapping has been completed. Preliminary results can be found in CV Proc through chart review.   03/15/22 4:57 PM Carlos Levering RVT

## 2022-03-15 NOTE — Progress Notes (Signed)
Cardiology Progress Note  Patient ID: [name] MRN: [id number] DOB: 11/09/1955 Date of Encounter: 03/15/2022  Primary Cardiologist: Buford Dresser, MD  Subjective   Chief Complaint: SOB  HPI: Started on dialysis.  Tolerating well.  She is still making urine.  Shortness of breath improving.  ROS:  All other ROS reviewed and negative. Pertinent positives noted in the HPI.     Inpatient Medications  Scheduled Meds:  amiodarone  200 mg Oral Daily   apixaban  5 mg Oral BID   Chlorhexidine Gluconate Cloth  6 each Topical Q0600   darbepoetin (ARANESP) injection - NON-DIALYSIS  60 mcg Subcutaneous Q Mon-1800   famotidine  20 mg Oral Daily   insulin aspart  0-5 Units Subcutaneous QHS   insulin aspart  0-6 Units Subcutaneous TID WC   isosorbide mononitrate  15 mg Oral Daily   metoprolol succinate  25 mg Oral BID   rosuvastatin  10 mg Oral Daily   Continuous Infusions:  sodium chloride Stopped (03/14/22 1900)   ferric gluconate (FERRLECIT) IVPB 250 mg (03/15/22 0840)   PRN Meds: sodium chloride, acetaminophen **OR** acetaminophen, docusate sodium, hydrocortisone cream, nitroGLYCERIN, ondansetron (ZOFRAN) IV   Vital Signs   Vitals:   03/14/22 1825 03/14/22 2003 03/15/22 0443 03/15/22 0826  BP: 127/62 (!) 117/56 127/62 (!) 115/96  Pulse: 81 80 84   Resp: _0 Temp: 98.1 F (36.7 C) 98.7 F (37.1 C) 97.7 F (36.5 C)   TempSrc: Oral Oral Oral   SpO2: 99%     Weight:   128.1 kg   Height:        Intake/Output Summary (Last 24 hours) at 03/15/2022 0841 Last data filed at 03/15/2022 0840 Gross per 24 hour  Intake 0 ml  Output 2600.7 ml  Net -2600.7 ml      03/15/2022    4:43 AM 03/14/2022    5:34 PM 03/14/2022    3:12 PM  Last 3 Weights  Weight (lbs) 282 lb 8 oz 283 lb 4.7 oz 284 lb 13.4 oz  Weight (kg) 128.141 kg 128.5 kg 129.2 kg      Telemetry  Overnight telemetry shows sinus rhythm heart rate in the 80s, which I personally reviewed.   Physical  Exam   Vitals:   03/14/22 1825 03/14/22 2003 03/15/22 0443 03/15/22 0826  BP: 127/62 (!) 117/56 127/62 (!) 115/96  Pulse: 81 80 84   Resp: _1 Temp: 98.1 F (36.7 C) 98.7 F (37.1 C) 97.7 F (36.5 C)   TempSrc: Oral Oral Oral   SpO2: 99%     Weight:   128.1 kg   Height:        Intake/Output Summary (Last 24 hours) at 03/15/2022 0841 Last data filed at 03/15/2022 0840 Gross per 24 hour  Intake 0 ml  Output 2600.7 ml  Net -2600.7 ml       03/15/2022    4:43 AM 03/14/2022    5:34 PM 03/14/2022    3:12 PM  Last 3 Weights  Weight (lbs) 282 lb 8 oz 283 lb 4.7 oz 284 lb 13.4 oz  Weight (kg) 128.141 kg 128.5 kg 129.2 kg    Body mass index is 44.25 kg/m.  General: Well nourished, well developed, in no acute distress Head: Atraumatic, normal size  Eyes: PEERLA, EOMI  Neck: Supple, no JVD Endocrine: No thryomegaly Cardiac: Normal S1, S2; RRR; 2/6 HSM Lungs: Clear to auscultation bilaterally, no wheezing, rhonchi or rales  Abd:  Soft, nontender, no hepatomegaly  Ext: No edema, pulses 2+ Musculoskeletal: No deformities, BUE and BLE strength normal and equal Skin: Warm and dry, no rashes   Neuro: Alert and oriented to person, place, time, and situation, CNII-XII grossly intact, no focal deficits  Psych: Normal mood and affect   Labs  High Sensitivity Troponin:   Recent Labs  Lab 03/08/22 1503 03/08/22 1712  TROPONINIHS 5 5     Cardiac EnzymesNo results for input(s): "TROPONINI" in the last 168 hours. No results for input(s): "TROPIPOC" in the last 168 hours.  Chemistry Recent Labs  Lab 03/12/22 0758 03/13/22 0652 03/14/22 1004 03/15/22 0200  NA 139 138 137 137  K 3.9 3.7 3.4* 3.5  CL 104 104 104 105  CO2 _0 GLUCOSE 109* 98 115* 146*  BUN 58* 67* 71* 58*  CREATININE 6.41* 6.78* 6.70* 5.47*  CALCIUM 9.1 8.9 9.0 8.6*  PROT 7.3  --   --   --   ALBUMIN 3.2* 3.1* 3.3*  --   AST 24  --   --   --   ALT 21  --   --   --   ALKPHOS 122  --   --   --    BILITOT 0.6  --   --   --   GFRNONAA 7* 6* 6* 8*  ANIONGAP _1 Hematology Recent Labs  Lab 03/14/22 0213 03/14/22 1004 03/15/22 0200  WBC 4.7 3.7* 5.3  RBC 3.51* 3.54* 3.26*  HGB 9.2* 9.5* 8.9*  HCT 29.2* 29.6* 27.0*  MCV 83.2 83.6 82.8  MCH 26.2 26.8 27.3  MCHC 31.5 32.1 33.0  RDW 18.8* 19.2* 19.2*  PLT 152 146* 125*   BNP Recent Labs  Lab 03/08/22 1506  BNP 205.3*    DDimer  Recent Labs  Lab 03/08/22 1501  DDIMER 0.70*     Radiology  IR Fluoro Guide CV Line Right  Result Date: 03/14/2022 INDICATION: 66 year old female referred for tunneled hemodialysis catheter EXAM: TUNNELED CENTRAL VENOUS HEMODIALYSIS CATHETER PLACEMENT WITH ULTRASOUND AND FLUOROSCOPIC GUIDANCE MEDICATIONS: 2 g Ancef. The antibiotic was given in an appropriate time interval prior to skin puncture. ANESTHESIA/SEDATION: Moderate (conscious) sedation was employed during this procedure. A total of Versed 1.0 mg and Fentanyl 25 mcg was administered intravenously by the radiology nurse. Total intra-service moderate Sedation Time: 15 minutes. The patient's level of consciousness and vital signs were monitored continuously by radiology nursing throughout the procedure under my direct supervision. FLUOROSCOPY: Radiation Exposure Index (as provided by the fluoroscopic device): 2 mGy Kerma COMPLICATIONS: None PROCEDURE: Informed written consent was obtained from the patient after a discussion of the risks, benefits, and alternatives to treatment. Questions regarding the procedure were encouraged and answered. The right neck and chest were prepped with chlorhexidine in a sterile fashion, and a sterile drape was applied covering the operative field. Maximum barrier sterile technique with sterile gowns and gloves were used for the procedure. A timeout was performed prior to the initiation of the procedure. Ultrasound survey was performed. The right internal jugular vein was confirmed to be patent, with images  stored and sent to PACS. Micropuncture kit was utilized to access the right internal jugular vein under direct, real-time ultrasound guidance after the overlying soft tissues were anesthetized with 1% lidocaine with epinephrine. Stab incision was made with 11 blade scalpel. Microwire was passed centrally. The microwire was then marked to measure appropriate internal catheter length. External tunneled length was estimated.  A total tip to cuff length of 19 cm was selected. 035 guidewire was advanced to the level of the IVC. Skin and subcutaneous tissues of chest wall below the clavicle were generously infiltrated with 1% lidocaine for local anesthesia. A small stab incision was made with 11 blade scalpel. The selected hemodialysis catheter was tunneled in a retrograde fashion from the anterior chest wall to the venotomy incision. Serial dilation was performed and then a peel-away sheath was placed. The catheter was then placed through the peel-away sheath with tips ultimately positioned within the superior aspect of the right atrium. Final catheter positioning was confirmed and documented with a spot radiographic image. The catheter aspirates and flushes normally. The catheter was flushed with appropriate volume heparin dwells. The catheter exit site was secured with a 0-Prolene retention suture. Gel-Foam slurry was infused into the soft tissue tract. The venotomy incision was closed Derma bond and sterile dressing. Dressings were applied at the chest wall. Patient tolerated the procedure well and remained hemodynamically stable throughout. No complications were encountered and no significant blood loss encountered. IMPRESSION: Status post right IJ tunneled hemodialysis catheter Signed, Dulcy Fanny. Nadene Rubins, RPVI [facility] Radiology Electronically Signed   By: Corrie Mckusick D.O.   On: 03/14/2022 10:34   IR US Guide Vasc Access Right  Result Date:  03/14/2022 INDICATION: 66 year old female referred for tunneled hemodialysis catheter EXAM: TUNNELED CENTRAL VENOUS HEMODIALYSIS CATHETER PLACEMENT WITH ULTRASOUND AND FLUOROSCOPIC GUIDANCE MEDICATIONS: 2 g Ancef. The antibiotic was given in an appropriate time interval prior to skin puncture. ANESTHESIA/SEDATION: Moderate (conscious) sedation was employed during this procedure. A total of Versed 1.0 mg and Fentanyl 25 mcg was administered intravenously by the radiology nurse. Total intra-service moderate Sedation Time: 15 minutes. The patient's level of consciousness and vital signs were monitored continuously by radiology nursing throughout the procedure under my direct supervision. FLUOROSCOPY: Radiation Exposure Index (as provided by the fluoroscopic device): 2 mGy Kerma COMPLICATIONS: None PROCEDURE: Informed written consent was obtained from the patient after a discussion of the risks, benefits, and alternatives to treatment. Questions regarding the procedure were encouraged and answered. The right neck and chest were prepped with chlorhexidine in a sterile fashion, and a sterile drape was applied covering the operative field. Maximum barrier sterile technique with sterile gowns and gloves were used for the procedure. A timeout was performed prior to the initiation of the procedure. Ultrasound survey was performed. The right internal jugular vein was confirmed to be patent, with images stored and sent to PACS. Micropuncture kit was utilized to access the right internal jugular vein under direct, real-time ultrasound guidance after the overlying soft tissues were anesthetized with 1% lidocaine with epinephrine. Stab incision was made with 11 blade scalpel. Microwire was passed centrally. The microwire was then marked to measure appropriate internal catheter length. External tunneled length was estimated. A total tip to cuff length of 19 cm was selected. 035 guidewire was advanced to the level of the IVC. Skin  and subcutaneous tissues of chest wall below the clavicle were generously infiltrated with 1% lidocaine for local anesthesia. A small stab incision was made with 11 blade scalpel. The selected hemodialysis catheter was tunneled in a retrograde fashion from the anterior chest wall to the venotomy incision. Serial dilation was performed and then a peel-away sheath was placed. The catheter was then placed through the peel-away sheath with tips ultimately positioned within the superior aspect of the right atrium. Final catheter positioning was confirmed  and documented with a spot radiographic image. The catheter aspirates and flushes normally. The catheter was flushed with appropriate volume heparin dwells. The catheter exit site was secured with a 0-Prolene retention suture. Gel-Foam slurry was infused into the soft tissue tract. The venotomy incision was closed Derma bond and sterile dressing. Dressings were applied at the chest wall. Patient tolerated the procedure well and remained hemodynamically stable throughout. No complications were encountered and no significant blood loss encountered. IMPRESSION: Status post right IJ tunneled hemodialysis catheter Signed, Dulcy Fanny. Nadene Rubins, RPVI [facility] Radiology Electronically Signed   By: Corrie Mckusick D.O.   On: 03/14/2022 10:34   ECHOCARDIOGRAM LIMITED  Result Date: 03/13/2022    ECHOCARDIOGRAM LIMITED REPORT   Patient Name:   DIETRICH KE Date of Exam: 03/13/2022 Medical Rec #:  [id number]      Height:       67.0 in Accession #:    [id number]     Weight:       280.9 lb Date of Birth:  [birth year]      BSA:          2.336 m Patient Age:    29 years       BP:           109/64 mmHg Patient Gender: F              HR:           75 bpm. Exam Location:  Inpatient Procedure: Limited Echo Indications:    Congestive heart failure  History:        Patient has prior history of Echocardiogram examinations, most                  recent 01/20/2022. CHF, COPD; Risk Factors:Diabetes, Hypertension                 and Sleep Apnea.  Sonographer:    Jefferey Pica Referring Phys: [id number] [place]  1. Abnormal septal motion . Left ventricular ejection fraction, by estimation, is 50 to 55%. The left ventricle has low normal function. The left ventricle has no regional wall motion abnormalities.  2. Right ventricular systolic function is normal. The right ventricular size is normal.  3. Left atrial size was severely dilated.  4. Right atrial size was severely dilated.  5. Post repair with annuloplasty ring no doppler/color flow done. The mitral valve was not assessed. No evidence of mitral valve regurgitation. No evidence of mitral stenosis.  6. Post repair with ring no doppler/color flow done. Tricuspid valve regurgitation is not demonstrated.  7. The aortic valve was not assessed. Aortic valve regurgitation is not visualized. No aortic stenosis is present.  8. The inferior vena cava is normal in size with greater than 50% respiratory variability, suggesting right atrial pressure of 3 mmHg. FINDINGS  Left Ventricle: Abnormal septal motion. Left ventricular ejection fraction, by estimation, is 50 to 55%. The left ventricle has low normal function. The left ventricle has no regional wall motion abnormalities. The left ventricular internal cavity size was normal in size. There is no left ventricular hypertrophy. Right Ventricle: The right ventricular size is normal. No increase in right ventricular wall thickness. Right ventricular systolic function is normal. Left Atrium: Left atrial size was severely dilated. Right Atrium: Right atrial size was severely dilated. Pericardium: There is no evidence of pericardial effusion. Mitral Valve: Post repair with annuloplasty ring no doppler/color flow done. The mitral  valve was not assessed. No evidence of mitral valve stenosis. Tricuspid Valve: Post repair with ring no  doppler/color flow done. The tricuspid valve is not assessed. Tricuspid valve regurgitation is not demonstrated. No evidence of tricuspid stenosis. Aortic Valve: The aortic valve was not assessed. Aortic valve regurgitation is not visualized. No aortic stenosis is present. Pulmonic Valve: The pulmonic valve was not assessed. Pulmonic valve regurgitation is not visualized. No evidence of pulmonic stenosis. Aorta: The aortic root is normal in size and structure. Venous: The inferior vena cava is normal in size with greater than 50% respiratory variability, suggesting right atrial pressure of 3 mmHg. IAS/Shunts: No atrial level shunt detected by color flow Doppler. LEFT VENTRICLE PLAX 2D LVIDd:         5.35 cm LVIDs:         3.70 cm LV PW:         1.05 cm LV IVS:        0.80 cm  LV Volumes (MOD) LV vol d, MOD A4C: 144.0 ml LV vol s, MOD A4C: 70.3 ml LV SV MOD A4C:     144.0 ml IVC IVC diam: 2.30 cm LEFT ATRIUM              Index        RIGHT ATRIUM           Index LA diam:        5.60 cm  2.40 cm/m   RA Area:     31.60 cm LA Vol (A2C):   109.0 ml 46.66 ml/m  RA Volume:   129.00 ml 55.22 ml/m LA Vol (A4C):   99.5 ml  42.59 ml/m LA Biplane Vol: 105.0 ml 44.94 ml/m   AORTA Ao Root diam: 3.10 cm Ao Asc diam:  3.00 cm Jenkins Rouge MD Electronically signed by Jenkins Rouge MD Signature Date/Time: 03/13/2022/10:21:28 AM    Final     Cardiac Studies  TTE 03/13/2022   1. Abnormal septal motion . Left ventricular ejection fraction, by  estimation, is 50 to 55%. The left ventricle has low normal function. The  left ventricle has no regional wall motion abnormalities.   2. Right ventricular systolic function is normal. The right ventricular  size is normal.   3. Left atrial size was severely dilated.   4. Right atrial size was severely dilated.   5. Post repair with annuloplasty ring no doppler/color flow done. The  mitral valve was not assessed. No evidence of mitral valve regurgitation.  No evidence of mitral  stenosis.   6. Post repair with ring no doppler/color flow done. Tricuspid valve  regurgitation is not demonstrated.   7. The aortic valve was not assessed. Aortic valve regurgitation is not  visualized. No aortic stenosis is present.   8. The inferior vena cava is normal in size with greater than 50%  respiratory variability, suggesting right atrial pressure of 3 mmHg.   Patient Profile  [name] is a 66 y.o. female with systolic heart failure (EF 35-40%), CKD stage V, mitral valve repair/tricuspid valve repair, diabetes, COPD, paroxysmal atrial fibrillation who was admitted on 03/09/2022 with chest pain and acute on chronic systolic heart failure.  Course has been complicated by AKI.  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  Now on dialysis.  Renal is managing volume status. -Limited echo shows EF has improved to  50-55%.  RV function is still reduced with elevated pulmonary pressures.  Tricuspid regurgitation appears to be improved.  Suspect this could be volume related. -Would continue with medical therapy including metoprolol succinate 25 mg daily.  Continue Imdur 15 mg daily.  Add hydralazine 10 mg 3 times daily.  Not a candidate for ACE/ARB/ARNI/MRA given CKD stage V. -We will continue this for medical therapy. -Volume status per renal.  #CKD stage V now on dialysis -Per renal  #Paroxysmal A-fib -Continue amiodarone for rhythm control.  Back on Eliquis.  [facility] will sign off.   Medication Recommendations: Medications as above.  Volume status per renal. Other recommendations (labs, testing, etc): None. Follow up as an outpatient: We will arrange outpatient follow-up with Dr. Harrell Gave in 4 to 6 weeks  For questions or updates, please contact [facility] [place] Please consult [email or web address] for contact info under      Signed, [place] Bells T. Audie Box, MD, [place]  03/15/2022 8:41 AM

## 2022-03-15 NOTE — Progress Notes (Signed)
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  Admitted with heart failure exacerbation and atypical chest pain.  Followed by cardiology.  Remains in the hospital due to persistent symptoms. She developed significant renal dysfunction and oliguria, started on dialysis. Remains in the hospital.  Will need outpatient dialysis.   Assessment & Plan:   Acute on chronic systolic and diastolic congestive heart failure: Failed diuretic challenge.  Worsening renal function. Cardiology following.  Not a candidate for goal-directed therapy due to poor renal functions.  Cardiology anticipating conservative management.  Currently remains on Imdur 15 mg, metoprolol 25 mg twice daily and a statin.  Obstructive sleep apnea: Currently untreated.  Will need a new study and device.  Chest pain: Atypical.  Persistent epigastric and retrosternal pain.  EKG nonischemic.  Troponins nonischemic.  Prior cardiac catheterization with minimal coronary artery disease.  Improved now.  Acute kidney injury on chronic kidney disease stage IV: Now ESRD.  Worsening renal functions.  Monitor closely.  Patient received contrast to rule out dissection.   Permacath placed.  Dialysis 7/25, 7/26.  Will need outpatient dialysis.  Paroxysmal A-fib: Currently in sinus rhythm.  On amiodarone.  Therapeutic on Eliquis.  Valvular heart disease, mitral valve repair tricuspid valve repair with residual severe tricuspid regurgitation: Probably exacerbating fluid imbalance.  Seen by structural heart team, advised she is not able to have surgery.  Hypokalemia:  Replaced.  Adequate.  DVT prophylaxis:   Heparin infusion.   Code Status: Full code Family Communication: None Disposition Plan: Status is: Inpatient.  Getting hemodialysis.  Will need outpatient dialysis before discharge planning.  Consultants:  Cardiology Nephrology  Procedures:  Permacath  Antimicrobials:  None   Subjective:  Seen and examined.  She was rolling out for hemodialysis.  Patient tells me she is doing fine.  Objective: Vitals:   03/15/22 1155 03/15/22 1308 03/15/22 1327 03/15/22 1351  BP: (!) 105/55 118/67  119/65  Pulse:    82  Resp: 20 (!) 25  16  Temp:  98.3 F (36.8 C)  98.5 F (36.9 C)  TempSrc:  Oral  Oral  SpO2:    100%  Weight:   (!) 136.3 kg   Height:        Intake/Output Summary (Last 24 hours) at 03/15/2022 1412 Last data filed at 03/15/2022 1327 Gross per 24 hour  Intake 440 ml  Output 3101.2 ml  Net -2661.2 ml    Filed Weights   03/15/22 0443 03/15/22 0948 03/15/22 1327  Weight: 128.1 kg (!) 136.6 kg (!) 136.3 kg    Examination:  General exam: Comfortable on room air. Respiratory system: Difficult to assess.  No added sounds. Cardiovascular system: S1 & S2 heard, RRR.   Gastrointestinal system: Soft.  Nontender.  No rigidity or guarding. Central nervous system: Alert and oriented. No focal neurological deficits. Extremities: Symmetric 5 x 5 power.    Data Reviewed: I have personally reviewed following labs and imaging studies  CBC: Recent Labs  Lab 03/08/22 1503 03/13/22 0652 03/14/22 0213 03/14/22 1004 03/15/22 0200  WBC 4.5 4.9 4.7 3.7* 5.3  HGB 10.7* 9.5* 9.2* 9.5* 8.9*  HCT 34.6* 29.9* 29.2* 29.6* 27.0*  MCV 84.0 81.9 83.2 83.6 82.8  PLT 184 153 152 146* 125*    Basic Metabolic Panel: Recent Labs  Lab 03/09/22 0558 03/09/22 1422 03/10/22 0233 03/11/22 0158 03/12/22 0758 03/13/22 0652 03/14/22 1004 03/15/22 0200  NA 141   < > 137 137 139 138 137 137  K 2.8*   < > 3.2* 3.1* 3.9 3.7 3.4* 3.5  CL  103   < > 101 101 104 104 104 105  CO2 29   < > $R'25 27 26 23 24 22  'XG$ GLUCOSE 96   < > 103* 100* 109* 98 115* 146*  BUN 34*   < > 36* 43* 58* 67* 71* 58*  CREATININE 2.46*   < > 3.12* 4.77* 6.41* 6.78* 6.70* 5.47*  CALCIUM 9.0   < > 8.9 8.8* 9.1 8.9 9.0 8.6*  MG 1.8  --  1.8 1.8 2.0  --   --   --   PHOS  --   --   --   --   --  6.1* 5.6*  --    < > = values in this interval not displayed.    GFR: Estimated Creatinine Clearance: 14.8 mL/min (A) (by C-G formula based on SCr of 5.47 mg/dL (H)). Liver Function Tests: Recent Labs  Lab 03/12/22 0758 03/13/22 0652 03/14/22 1004  AST 24  --   --   ALT 21  --   --   ALKPHOS 122  --   --   BILITOT 0.6  --   --   PROT 7.3  --   --   ALBUMIN 3.2* 3.1* 3.3*    No results for input(s): "LIPASE", "AMYLASE" in the last 168 hours. No results for input(s): "AMMONIA" in the last 168 hours. Coagulation Profile: No results for input(s): "INR", "PROTIME" in the last 168 hours. Cardiac Enzymes: No results for input(s): "CKTOTAL", "CKMB", "CKMBINDEX", "TROPONINI" in the last 168 hours. BNP (last 3 results) No results for input(s): "PROBNP" in the last 8760 hours. HbA1C: No results for input(s): "HGBA1C" in the last 72 hours. CBG: Recent Labs  Lab 03/14/22 1112 03/14/22 1822 03/14/22 2124 03/15/22 0747 03/15/22 1357  GLUCAP 162* 101* 156* 114* 86    Lipid Profile: No results for input(s): "CHOL", "HDL", "LDLCALC", "TRIG", "CHOLHDL", "LDLDIRECT" in the last 72 hours. Thyroid Function Tests: No results for input(s): "TSH", "T4TOTAL", "FREET4", "T3FREE", "THYROIDAB" in the last 72 hours. Anemia Panel: Recent Labs    03/13/22 0652  FERRITIN 118  TIBC 231*  IRON 63    Sepsis Labs: No results for input(s): "PROCALCITON", "LATICACIDVEN" in the last 168 hours.  Recent Results (from the past 240 hour(s))  SARS Coronavirus 2 by RT PCR (hospital order, performed in [facility] hospital lab) *cepheid single result test* Anterior Nasal Swab      Status: None   Collection Time: 03/08/22  3:55 PM   Specimen: Anterior Nasal Swab  Result Value Ref Range Status   SARS Coronavirus 2 by RT PCR NEGATIVE NEGATIVE Final    Comment: (NOTE) SARS-CoV-2 target nucleic acids are NOT DETECTED.  The SARS-CoV-2 RNA is generally detectable in upper and lower respiratory specimens during the acute phase of infection. The lowest concentration of SARS-CoV-2 viral copies this assay can detect is 250 copies / mL. A negative result does not preclude SARS-CoV-2 infection and should not be used as the sole basis for treatment or other patient management decisions.  A negative result may occur with improper specimen collection / handling, submission  of specimen other than nasopharyngeal swab, presence of viral mutation(s) within the areas targeted by this assay, and inadequate number of viral copies (<250 copies / mL). A negative result must be combined with clinical observations, patient history, and epidemiological information.  Fact Sheet for Patients:   https://www.patel.info/  Fact Sheet for Healthcare Providers: https://hall.com/  This test is not yet approved or  cleared by the [place] FDA and has been authorized for detection and/or diagnosis of SARS-CoV-2 by FDA under an Emergency Use Authorization (EUA).  This EUA will remain in effect (meaning this test can be used) for the duration of the COVID-19 declaration under Section 564(b)(1) of the Act, 21 U.S.C. section 360bbb-3(b)(1), unless the authorization is terminated or revoked sooner.  Performed at [facility], [address], [place], [place] [address]   Surgical PCR screen     Status: None   Collection Time: 03/12/22  9:20 PM   Specimen: Nasal Mucosa; Nasal Swab  Result Value Ref Range Status   MRSA, PCR NEGATIVE NEGATIVE Final   Staphylococcus aureus NEGATIVE NEGATIVE Final    Comment: (NOTE) The Xpert SA  Assay (FDA approved for NASAL specimens in patients 58 years of age and older), is one component of a comprehensive surveillance program. It is not intended to diagnose infection nor to guide or monitor treatment. Performed at [facility], [place] [address], [place], [place] [address]          Radiology Studies: IR Fluoro Guide CV Line Right  Result Date: 03/14/2022 INDICATION: 66 year old female referred for tunneled hemodialysis catheter EXAM: TUNNELED CENTRAL VENOUS HEMODIALYSIS CATHETER PLACEMENT WITH ULTRASOUND AND FLUOROSCOPIC GUIDANCE MEDICATIONS: 2 g Ancef. The antibiotic was given in an appropriate time interval prior to skin puncture. ANESTHESIA/SEDATION: Moderate (conscious) sedation was employed during this procedure. A total of Versed 1.0 mg and Fentanyl 25 mcg was administered intravenously by the radiology nurse. Total intra-service moderate Sedation Time: 15 minutes. The patient's level of consciousness and vital signs were monitored continuously by radiology nursing throughout the procedure under my direct supervision. FLUOROSCOPY: Radiation Exposure Index (as provided by the fluoroscopic device): 2 mGy Kerma COMPLICATIONS: None PROCEDURE: Informed written consent was obtained from the patient after a discussion of the risks, benefits, and alternatives to treatment. Questions regarding the procedure were encouraged and answered. The right neck and chest were prepped with chlorhexidine in a sterile fashion, and a sterile drape was applied covering the operative field. Maximum barrier sterile technique with sterile gowns and gloves were used for the procedure. A timeout was performed prior to the initiation of the procedure. Ultrasound survey was performed. The right internal jugular vein was confirmed to be patent, with images stored and sent to PACS. Micropuncture kit was utilized to access the right internal jugular vein under direct, real-time ultrasound guidance after the  overlying soft tissues were anesthetized with 1% lidocaine with epinephrine. Stab incision was made with 11 blade scalpel. Microwire was passed centrally. The microwire was then marked to measure appropriate internal catheter length. External tunneled length was estimated. A total tip to cuff length of 19 cm was selected. 035 guidewire was advanced to the level of the IVC. Skin and subcutaneous tissues of chest wall below the clavicle were generously infiltrated with 1% lidocaine for local anesthesia. A small stab incision was made with 11 blade scalpel. The selected hemodialysis catheter was tunneled in a retrograde fashion from the anterior chest wall to the venotomy incision. Serial dilation was performed and then a  peel-away sheath was placed. The catheter was then placed through the peel-away sheath with tips ultimately positioned within the superior aspect of the right atrium. Final catheter positioning was confirmed and documented with a spot radiographic image. The catheter aspirates and flushes normally. The catheter was flushed with appropriate volume heparin dwells. The catheter exit site was secured with a 0-Prolene retention suture. Gel-Foam slurry was infused into the soft tissue tract. The venotomy incision was closed Derma bond and sterile dressing. Dressings were applied at the chest wall. Patient tolerated the procedure well and remained hemodynamically stable throughout. No complications were encountered and no significant blood loss encountered. IMPRESSION: Status post right IJ tunneled hemodialysis catheter Signed, Dulcy Fanny. Nadene Rubins, RPVI [facility] Radiology Electronically Signed   By: Corrie Mckusick D.O.   On: 03/14/2022 10:34   IR US Guide Vasc Access Right  Result Date: 03/14/2022 INDICATION: 66 year old female referred for tunneled hemodialysis catheter EXAM: TUNNELED CENTRAL VENOUS HEMODIALYSIS CATHETER PLACEMENT WITH ULTRASOUND  AND FLUOROSCOPIC GUIDANCE MEDICATIONS: 2 g Ancef. The antibiotic was given in an appropriate time interval prior to skin puncture. ANESTHESIA/SEDATION: Moderate (conscious) sedation was employed during this procedure. A total of Versed 1.0 mg and Fentanyl 25 mcg was administered intravenously by the radiology nurse. Total intra-service moderate Sedation Time: 15 minutes. The patient's level of consciousness and vital signs were monitored continuously by radiology nursing throughout the procedure under my direct supervision. FLUOROSCOPY: Radiation Exposure Index (as provided by the fluoroscopic device): 2 mGy Kerma COMPLICATIONS: None PROCEDURE: Informed written consent was obtained from the patient after a discussion of the risks, benefits, and alternatives to treatment. Questions regarding the procedure were encouraged and answered. The right neck and chest were prepped with chlorhexidine in a sterile fashion, and a sterile drape was applied covering the operative field. Maximum barrier sterile technique with sterile gowns and gloves were used for the procedure. A timeout was performed prior to the initiation of the procedure. Ultrasound survey was performed. The right internal jugular vein was confirmed to be patent, with images stored and sent to PACS. Micropuncture kit was utilized to access the right internal jugular vein under direct, real-time ultrasound guidance after the overlying soft tissues were anesthetized with 1% lidocaine with epinephrine. Stab incision was made with 11 blade scalpel. Microwire was passed centrally. The microwire was then marked to measure appropriate internal catheter length. External tunneled length was estimated. A total tip to cuff length of 19 cm was selected. 035 guidewire was advanced to the level of the IVC. Skin and subcutaneous tissues of chest wall below the clavicle were generously infiltrated with 1% lidocaine for local anesthesia. A small stab incision was made with 11  blade scalpel. The selected hemodialysis catheter was tunneled in a retrograde fashion from the anterior chest wall to the venotomy incision. Serial dilation was performed and then a peel-away sheath was placed. The catheter was then placed through the peel-away sheath with tips ultimately positioned within the superior aspect of the right atrium. Final catheter positioning was confirmed and documented with a spot radiographic image. The catheter aspirates and flushes normally. The catheter was flushed with appropriate volume heparin dwells. The catheter exit site was secured with a 0-Prolene retention suture. Gel-Foam slurry was infused into the soft tissue tract. The venotomy incision was closed Derma bond and sterile dressing. Dressings were applied at the chest wall. Patient tolerated the procedure well and remained hemodynamically stable throughout. No complications were encountered and no significant  blood loss encountered. IMPRESSION: Status post right IJ tunneled hemodialysis catheter Signed, Dulcy Fanny. Nadene Rubins, RPVI [facility] Radiology Electronically Signed   By: Corrie Mckusick D.O.   On: 03/14/2022 10:34        Scheduled Meds:  amiodarone  200 mg Oral Daily   Chlorhexidine Gluconate Cloth  6 each Topical Q0600   darbepoetin (ARANESP) injection - NON-DIALYSIS  60 mcg Subcutaneous Q Mon-1800   famotidine  20 mg Oral Daily   hydrALAZINE  10 mg Oral Q8H   insulin aspart  0-5 Units Subcutaneous QHS   insulin aspart  0-6 Units Subcutaneous TID WC   isosorbide mononitrate  15 mg Oral Daily   metoprolol succinate  25 mg Oral BID   rosuvastatin  10 mg Oral Daily   Continuous Infusions:  sodium chloride Stopped (03/14/22 1900)   ferric gluconate (FERRLECIT) IVPB 250 mg (03/15/22 0840)     LOS: 5 days    Time spent: 35 minutes    Barb Merino, MD [facility] Pager [phone]

## 2022-03-15 NOTE — Progress Notes (Signed)
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 Now likely ESRD at this point. -ineffective diuresis with recurrent admissions for hypervolemia and now with uremic symptoms, Dr. Candiss Norse had a lengthy discussion in regards to renal replacement therapy, decision was made to start-TDC placed 7/25 followed by first HD  -Plan for second HD session today- clip in process     -Will plan to get AV access placed while here as well but let her get a treatment or 2 under her belt first -  will call [facility] today  to see and have ordered vein mapping   Ready for discharge once OP clinic IDd +/- AV access placement    Acute on chronic combined CHF -Does make urine which will make her HD more manageable  Chest pain -Per cardiology and primary.   Paroxysmal Afib -On amio 200 mg daily, metoprolol 25 mg bid -Eliquis on hold d/t dialysis catheter placement today, was on heparin in the interim  Anemia of chronic disease -Transfuse for Hb<7 Hgb dropping slowly with events-  getting iron and ESA  Will check PTH at some point- ordered tomorrow.  phos is not bad-  no binder   Subjective:    Had first HD yest-  removed 700 -  also had 1200 of urine-  reports some tenderness at new [facility] site  Objective Vital signs in last 24 hours: Vitals:   03/14/22 1734 03/14/22 1825 03/14/22 2003 03/15/22 0443  BP: 124/79 127/62 (!) 117/56 127/62  Pulse: 80 81 80 84  Resp: $Remo'20 17 17 16  'uLQbC$ Temp: 98.6 F (37 C) 98.1 F (36.7 C) 98.7 F (37.1 C) 97.7 F (36.5 C)  TempSrc:  Oral Oral Oral  SpO2:  99%    Weight: 128.5 kg   128.1 kg  Height:       Weight change: 1.8 kg  Intake/Output Summary (Last 24 hours) at 03/15/2022 1749 Last data filed at 03/15/2022 0452 Gross per 24 hour  Intake 0 ml  Output 1900.7 ml  Net -1900.7 ml    Labs: Basic Metabolic Panel: Recent Labs  Lab 03/13/22 0652 03/14/22 1004 03/15/22 0200  NA 138 137 137  K  3.7 3.4* 3.5  CL 104 104 105  CO2 $Re'23 24 22  'Pgc$ GLUCOSE 98 115* 146*  BUN 67* 71* 58*  CREATININE 6.78* 6.70* 5.47*  CALCIUM 8.9 9.0 8.6*  PHOS 6.1* 5.6*  --    Liver Function Tests: Recent Labs  Lab 03/12/22 0758 03/13/22 0652 03/14/22 1004  AST 24  --   --   ALT 21  --   --   ALKPHOS 122  --   --   BILITOT 0.6  --   --   PROT 7.3  --   --   ALBUMIN 3.2* 3.1* 3.3*   No results for input(s): "LIPASE", "AMYLASE" in the last 168 hours. No results for input(s): "AMMONIA" in the last 168 hours. CBC: Recent Labs  Lab 03/08/22 1503 03/13/22 0652 03/14/22 0213 03/14/22 1004 03/15/22 0200  WBC 4.5 4.9 4.7 3.7* 5.3  HGB 10.7* 9.5* 9.2* 9.5* 8.9*  HCT 34.6* 29.9* 29.2* 29.6* 27.0*  MCV 84.0 81.9 83.2 83.6 82.8  PLT 184 153 152 146* 125*   Cardiac Enzymes: No results for input(s): "CKTOTAL", "CKMB", "CKMBINDEX", "TROPONINI" in the last 168 hours. CBG: Recent Labs  Lab 03/14/22 0729 03/14/22 1112 03/14/22 1822 03/14/22 2124 03/15/22 0747  GLUCAP 123* 162* 101* 156* 114*  Iron Studies:  Recent Labs    03/13/22 0652  IRON 63  TIBC 231*  FERRITIN 118   Studies/Results: IR Fluoro Guide CV Line Right  Result Date: 03/14/2022 INDICATION: 66 year old female referred for tunneled hemodialysis catheter EXAM: TUNNELED CENTRAL VENOUS HEMODIALYSIS CATHETER PLACEMENT WITH ULTRASOUND AND FLUOROSCOPIC GUIDANCE MEDICATIONS: 2 g Ancef. The antibiotic was given in an appropriate time interval prior to skin puncture. ANESTHESIA/SEDATION: Moderate (conscious) sedation was employed during this procedure. A total of Versed 1.0 mg and Fentanyl 25 mcg was administered intravenously by the radiology nurse. Total intra-service moderate Sedation Time: 15 minutes. The patient's level of consciousness and vital signs were monitored continuously by radiology nursing throughout the procedure under my direct supervision. FLUOROSCOPY: Radiation Exposure Index (as provided by the fluoroscopic device):  2 mGy Kerma COMPLICATIONS: None PROCEDURE: Informed written consent was obtained from the patient after a discussion of the risks, benefits, and alternatives to treatment. Questions regarding the procedure were encouraged and answered. The right neck and chest were prepped with chlorhexidine in a sterile fashion, and a sterile drape was applied covering the operative field. Maximum barrier sterile technique with sterile gowns and gloves were used for the procedure. A timeout was performed prior to the initiation of the procedure. Ultrasound survey was performed. The right internal jugular vein was confirmed to be patent, with images stored and sent to PACS. Micropuncture kit was utilized to access the right internal jugular vein under direct, real-time ultrasound guidance after the overlying soft tissues were anesthetized with 1% lidocaine with epinephrine. Stab incision was made with 11 blade scalpel. Microwire was passed centrally. The microwire was then marked to measure appropriate internal catheter length. External tunneled length was estimated. A total tip to cuff length of 19 cm was selected. 035 guidewire was advanced to the level of the IVC. Skin and subcutaneous tissues of chest wall below the clavicle were generously infiltrated with 1% lidocaine for local anesthesia. A small stab incision was made with 11 blade scalpel. The selected hemodialysis catheter was tunneled in a retrograde fashion from the anterior chest wall to the venotomy incision. Serial dilation was performed and then a peel-away sheath was placed. The catheter was then placed through the peel-away sheath with tips ultimately positioned within the superior aspect of the right atrium. Final catheter positioning was confirmed and documented with a spot radiographic image. The catheter aspirates and flushes normally. The catheter was flushed with appropriate volume heparin dwells. The catheter exit site was secured with a 0-Prolene retention  suture. Gel-Foam slurry was infused into the soft tissue tract. The venotomy incision was closed Derma bond and sterile dressing. Dressings were applied at the chest wall. Patient tolerated the procedure well and remained hemodynamically stable throughout. No complications were encountered and no significant blood loss encountered. IMPRESSION: Status post right IJ tunneled hemodialysis catheter Signed, Dulcy Fanny. Nadene Rubins, RPVI Vascular and Interventional Radiology Specialists [facility] Radiology Electronically Signed   By: Corrie Mckusick D.O.   On: 03/14/2022 10:34   IR US Guide Vasc Access Right  Result Date: 03/14/2022 INDICATION: 67 year old female referred for tunneled hemodialysis catheter EXAM: TUNNELED CENTRAL VENOUS HEMODIALYSIS CATHETER PLACEMENT WITH ULTRASOUND AND FLUOROSCOPIC GUIDANCE MEDICATIONS: 2 g Ancef. The antibiotic was given in an appropriate time interval prior to skin puncture. ANESTHESIA/SEDATION: Moderate (conscious) sedation was employed during this procedure. A total of Versed 1.0 mg and Fentanyl 25 mcg was administered intravenously by the radiology nurse. Total intra-service moderate Sedation Time: 15 minutes. The patient's level of  consciousness and vital signs were monitored continuously by radiology nursing throughout the procedure under my direct supervision. FLUOROSCOPY: Radiation Exposure Index (as provided by the fluoroscopic device): 2 mGy Kerma COMPLICATIONS: None PROCEDURE: Informed written consent was obtained from the patient after a discussion of the risks, benefits, and alternatives to treatment. Questions regarding the procedure were encouraged and answered. The right neck and chest were prepped with chlorhexidine in a sterile fashion, and a sterile drape was applied covering the operative field. Maximum barrier sterile technique with sterile gowns and gloves were used for the procedure. A timeout was performed prior to the initiation of the procedure. Ultrasound  survey was performed. The right internal jugular vein was confirmed to be patent, with images stored and sent to PACS. Micropuncture kit was utilized to access the right internal jugular vein under direct, real-time ultrasound guidance after the overlying soft tissues were anesthetized with 1% lidocaine with epinephrine. Stab incision was made with 11 blade scalpel. Microwire was passed centrally. The microwire was then marked to measure appropriate internal catheter length. External tunneled length was estimated. A total tip to cuff length of 19 cm was selected. 035 guidewire was advanced to the level of the IVC. Skin and subcutaneous tissues of chest wall below the clavicle were generously infiltrated with 1% lidocaine for local anesthesia. A small stab incision was made with 11 blade scalpel. The selected hemodialysis catheter was tunneled in a retrograde fashion from the anterior chest wall to the venotomy incision. Serial dilation was performed and then a peel-away sheath was placed. The catheter was then placed through the peel-away sheath with tips ultimately positioned within the superior aspect of the right atrium. Final catheter positioning was confirmed and documented with a spot radiographic image. The catheter aspirates and flushes normally. The catheter was flushed with appropriate volume heparin dwells. The catheter exit site was secured with a 0-Prolene retention suture. Gel-Foam slurry was infused into the soft tissue tract. The venotomy incision was closed Derma bond and sterile dressing. Dressings were applied at the chest wall. Patient tolerated the procedure well and remained hemodynamically stable throughout. No complications were encountered and no significant blood loss encountered. IMPRESSION: Status post right IJ tunneled hemodialysis catheter Signed, Dulcy Fanny. Nadene Rubins, RPVI Vascular and Interventional Radiology Specialists [facility] Radiology Electronically Signed   By: Corrie Mckusick D.O.   On: 03/14/2022 10:34   ECHOCARDIOGRAM LIMITED  Result Date: 03/13/2022    ECHOCARDIOGRAM LIMITED REPORT   Patient Name:   [name] Date of Exam: 03/13/2022 Medical Rec #:  [id number]      Height:       67.0 in Accession #:    [id number]     Weight:       280.9 lb Date of Birth:  [birth year]      BSA:          2.336 m Patient Age:    66 years       BP:           109/64 mmHg Patient Gender: F              HR:           75 bpm. Exam Location:  Inpatient Procedure: Limited Echo Indications:    Congestive heart failure  History:        Patient has prior history of Echocardiogram examinations, most                 recent 01/20/2022. CHF,  COPD; Risk Factors:Diabetes, Hypertension                 and Sleep Apnea.  Sonographer:    Jefferey Pica Referring Phys: [id number] Gratz  1. Abnormal septal motion . Left ventricular ejection fraction, by estimation, is 50 to 55%. The left ventricle has low normal function. The left ventricle has no regional wall motion abnormalities.  2. Right ventricular systolic function is normal. The right ventricular size is normal.  3. Left atrial size was severely dilated.  4. Right atrial size was severely dilated.  5. Post repair with annuloplasty ring no doppler/color flow done. The mitral valve was not assessed. No evidence of mitral valve regurgitation. No evidence of mitral stenosis.  6. Post repair with ring no doppler/color flow done. Tricuspid valve regurgitation is not demonstrated.  7. The aortic valve was not assessed. Aortic valve regurgitation is not visualized. No aortic stenosis is present.  8. The inferior vena cava is normal in size with greater than 50% respiratory variability, suggesting right atrial pressure of 3 mmHg. FINDINGS  Left Ventricle: Abnormal septal motion. Left ventricular ejection fraction, by estimation, is 50 to 55%. The left ventricle has low normal function. The left ventricle has no regional wall motion  abnormalities. The left ventricular internal cavity size was normal in size. There is no left ventricular hypertrophy. Right Ventricle: The right ventricular size is normal. No increase in right ventricular wall thickness. Right ventricular systolic function is normal. Left Atrium: Left atrial size was severely dilated. Right Atrium: Right atrial size was severely dilated. Pericardium: There is no evidence of pericardial effusion. Mitral Valve: Post repair with annuloplasty ring no doppler/color flow done. The mitral valve was not assessed. No evidence of mitral valve stenosis. Tricuspid Valve: Post repair with ring no doppler/color flow done. The tricuspid valve is not assessed. Tricuspid valve regurgitation is not demonstrated. No evidence of tricuspid stenosis. Aortic Valve: The aortic valve was not assessed. Aortic valve regurgitation is not visualized. No aortic stenosis is present. Pulmonic Valve: The pulmonic valve was not assessed. Pulmonic valve regurgitation is not visualized. No evidence of pulmonic stenosis. Aorta: The aortic root is normal in size and structure. Venous: The inferior vena cava is normal in size with greater than 50% respiratory variability, suggesting right atrial pressure of 3 mmHg. IAS/Shunts: No atrial level shunt detected by color flow Doppler. LEFT VENTRICLE PLAX 2D LVIDd:         5.35 cm LVIDs:         3.70 cm LV PW:         1.05 cm LV IVS:        0.80 cm  LV Volumes (MOD) LV vol d, MOD A4C: 144.0 ml LV vol s, MOD A4C: 70.3 ml LV SV MOD A4C:     144.0 ml IVC IVC diam: 2.30 cm LEFT ATRIUM              Index        RIGHT ATRIUM           Index LA diam:        5.60 cm  2.40 cm/m   RA Area:     31.60 cm LA Vol (A2C):   109.0 ml 46.66 ml/m  RA Volume:   129.00 ml 55.22 ml/m LA Vol (A4C):   99.5 ml  42.59 ml/m LA Biplane Vol: 105.0 ml 44.94 ml/m   AORTA Ao Root diam: 3.10 cm Ao Asc diam:  3.00  cm Jenkins Rouge MD Electronically signed by Jenkins Rouge MD Signature Date/Time:  03/13/2022/10:21:28 AM    Final     Medications: Infusions:  sodium chloride Stopped (03/14/22 1900)   ferric gluconate (FERRLECIT) IVPB Stopped (03/14/22 1643)    Scheduled Medications:  amiodarone  200 mg Oral Daily   apixaban  5 mg Oral BID   Chlorhexidine Gluconate Cloth  6 each Topical Q0600   darbepoetin (ARANESP) injection - NON-DIALYSIS  60 mcg Subcutaneous Q Mon-1800   famotidine  20 mg Oral Daily   insulin aspart  0-5 Units Subcutaneous QHS   insulin aspart  0-6 Units Subcutaneous TID WC   isosorbide mononitrate  15 mg Oral Daily   metoprolol succinate  25 mg Oral BID   rosuvastatin  10 mg Oral Daily    have reviewed scheduled and prn medications.  Physical Exam: General: Resting comfortably, no acute distress Heart: Regular rate, rhythm. No murmurs. Lungs: Normal respiratory effort. Clear to auscultation bilaterally. Abdomen: Soft, non-tender, obese abdomen Extremities: No pitting edema in lower extremities Neuro: Awake, alert, conversing appropriately. No focal deficits. Dialysis Access: right IJ tunneled HD cath    [facility] A Irvan Tiedt  03/15/2022,8:22 AM  LOS: 5 days

## 2022-03-16 DIAGNOSIS — N185 Chronic kidney disease, stage 5: Secondary | ICD-10-CM | POA: Diagnosis not present

## 2022-03-16 DIAGNOSIS — I480 Paroxysmal atrial fibrillation: Secondary | ICD-10-CM | POA: Diagnosis not present

## 2022-03-16 DIAGNOSIS — I5043 Acute on chronic combined systolic (congestive) and diastolic (congestive) heart failure: Secondary | ICD-10-CM | POA: Diagnosis not present

## 2022-03-16 DIAGNOSIS — N179 Acute kidney failure, unspecified: Secondary | ICD-10-CM | POA: Diagnosis not present

## 2022-03-16 LAB — SURGICAL PCR SCREEN
MRSA, PCR: NEGATIVE
Staphylococcus aureus: NEGATIVE

## 2022-03-16 LAB — BASIC METABOLIC PANEL
Anion gap: 7 (ref 5–15)
BUN: 39 mg/dL — ABNORMAL HIGH (ref 8–23)
CO2: 27 mmol/L (ref 22–32)
Calcium: 8.9 mg/dL (ref 8.9–10.3)
Chloride: 103 mmol/L (ref 98–111)
Creatinine, Ser: 3.9 mg/dL — ABNORMAL HIGH (ref 0.44–1.00)
GFR, Estimated: 12 mL/min — ABNORMAL LOW (ref >60)
Glucose, Bld: 106 mg/dL — ABNORMAL HIGH (ref 70–99)
Potassium: 3.4 mmol/L — ABNORMAL LOW (ref 3.5–5.1)
Sodium: 137 mmol/L (ref 135–145)

## 2022-03-16 LAB — GLUCOSE, CAPILLARY
Glucose-Capillary: 131 mg/dL — ABNORMAL HIGH (ref 70–99)
Glucose-Capillary: 99 mg/dL (ref 70–99)
Glucose-Capillary: 124 mg/dL — ABNORMAL HIGH (ref 70–99)
Glucose-Capillary: 135 mg/dL — ABNORMAL HIGH (ref 70–99)

## 2022-03-16 LAB — CBC
HCT: 28.3 % — ABNORMAL LOW (ref 36.0–46.0)
Hemoglobin: 9.2 g/dL — ABNORMAL LOW (ref 12.0–15.0)
MCH: 26.8 pg (ref 26.0–34.0)
MCHC: 32.5 g/dL (ref 30.0–36.0)
MCV: 82.5 fL (ref 80.0–100.0)
Platelets: 132 10*3/uL — ABNORMAL LOW (ref 150–400)
RBC: 3.43 MIL/uL — ABNORMAL LOW (ref 3.87–5.11)
RDW: 19.1 % — ABNORMAL HIGH (ref 11.5–15.5)
WBC: 5.8 10*3/uL (ref 4.0–10.5)
nRBC: 0 % (ref 0.0–0.2)

## 2022-03-16 MED ORDER — CEFAZOLIN SODIUM-DEXTROSE 1-4 GM/50ML-% IV SOLN: 1.0000 g | INTRAVENOUS | Status: AC

## 2022-03-16 NOTE — Progress Notes (Signed)
Pt has been accepted at [facility] SW on TTS with 6:25 am chair time. Pt can start on Saturday if needed. Clinic would like pt to complete paperwork a day before first treatment if possible but if not, pt will need to arrive at 5:45 -6:00 am to complete paperwork prior to first treatment. Met with pt to discuss above details. Pt agreeable to plans and provided schedule letter. Update provided to nephrologist, attending, and RN CM. Will add out-pt HD arrangements to pt's AVS as well.   Melven Sartorius Renal Navigator [phone]

## 2022-03-16 NOTE — TOC Initial Note (Signed)
Transition of Care [facility]) - Initial/Assessment Note    Patient Details  Name: [name] MRN: [id number] Date of Birth: [birth year]  Transition [facility]) CM/SW Contact:    Bethena Roys, RN Phone Number: 03/16/2022, 11:59 AM  Clinical Narrative: Plan for HD outpatient-Clip in process. Per MD notes plan for AVF hopefully Friday. PTA patient was from home with family support. Case Manager following for additional transition of care needs.             Expected Discharge Plan: Home/Self Care Barriers to Discharge: Continued Medical Work up   Patient Goals and CMS Choice Patient states their goals for this hospitalization and ongoing recovery are:: to return home      Expected Discharge Plan and Services Expected Discharge Plan: Home/Self Care In-house Referral: Clinical Social Work Discharge Planning Services: CM Consult   Living arrangements for the past 2 months: Apartment                   DME Agency: NA    Prior Living Arrangements/Services Living arrangements for the past 2 months: Apartment Lives with:: Adult Children Patient language and need for interpreter reviewed:: Yes Do you feel safe going back to the place where you live?: Yes      Need for Family Participation in Patient Care: No (Comment) Care giver support system in place?: No (comment) Current home services: DME (pt has rollator, bedside commode and cane) Criminal Activity/Legal Involvement Pertinent to Current Situation/Hospitalization: No - Comment as needed  Activities of Daily Living Home Assistive Devices/Equipment: CBG Meter, Cane (specify quad or straight) ADL Screening (condition at time of admission) Patient's cognitive ability adequate to safely complete daily activities?: Yes Is the patient deaf or have difficulty hearing?: No Does the patient have difficulty seeing, even when wearing glasses/contacts?: No Does the patient have difficulty concentrating, remembering, or  making decisions?: No Patient able to express need for assistance with ADLs?: Yes Does the patient have difficulty dressing or bathing?: No Independently performs ADLs?: Yes (appropriate for developmental age) Does the patient have difficulty walking or climbing stairs?: No Weakness of Legs: Both Weakness of Arms/Hands: None  Permission Sought/Granted Permission sought to share information with : Family Supports, Case Manager Permission granted to share information with : Yes, Verbal Permission Granted              Emotional Assessment Appearance:: Appears stated age Attitude/Demeanor/Rapport: Engaged Affect (typically observed): Appropriate Orientation: : Oriented to Situation, Oriented to  Time, Oriented to Place, Oriented to Self Alcohol / Substance Use: Not Applicable Psych Involvement: No (comment)  Admission diagnosis:  Pulmonary hypertension ([place]) [I27.20] Positive D dimer [R79.89] Chest pain [R07.9] Chest pain, unspecified type [R07.9] CHF exacerbation (HCC) [I50.9] Patient Active Problem List   Diagnosis Date Noted   Hypokalemia 03/09/2022   Chest pain 03/08/2022   Depression 01/21/2022   Acute kidney injury superimposed on chronic kidney disease ([place]) 01/18/2022   Chronic deep vein thrombosis (DVT) of other vein of right upper extremity ([place]) 12/29/2021   Abnormal weight loss 11/16/2021   Personal history of colonic polyps 11/16/2021   Rib pain 11/16/2021   Anxiety and depression 08/26/2021   Essential hypertension 08/26/2021   GERD (gastroesophageal reflux disease) 08/26/2021   COPD (chronic obstructive pulmonary disease) ([place]) 07/29/2021   QT prolongation 07/29/2021   Biventricular heart failure ([place])    Atrial flutter ([place])    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   Encounter  for preoperative dental examination    Teeth missing    Gingivitis    Accretions on teeth    Atrial fibrillation, permanent (HCC) 11/02/2020    Acute on chronic right heart failure ([place]) 05/06/2020   NICM (nonischemic cardiomyopathy) ([place]) 04/01/2020   Family history of heart disease 04/01/2020   Mitral regurgitation 04/01/2020   Acute on chronic left systolic heart failure ([place]) 07/01/2018   CHF exacerbation ([place]) 07/01/2018   Chronic atrial fibrillation ([place]) 11/65/7903   Diastolic dysfunction 83/33/8329   Diabetes mellitus type 2 in obese ([place]) 12/26/2017   Hypoxia 09/21/2011   Type 2 diabetes mellitus with hyperlipidemia ([place]) 02/04/2010   Class 3 obesity ([place]) 02/04/2010   AF (paroxysmal atrial fibrillation) ([place]) 02/04/2010   Gastroparesis 02/04/2010   PCP:  [name], NP Pharmacy:   [id number] [place] [place] [address]Phone: [phone]: [phone] [facility] [place]Phone: [phone]: [phone] [place]- [address]), [place] - [address] [place] [address]Phone: [phone]: [phone] [place] [facility]Phone: [phone]: [phone] Readmission Risk Interventions    01/20/2022    5:06 PM 08/31/2021    1:49 PM 02/10/2021   10:17 AM  Readmission Risk Prevention Plan  Transportation Screening Complete Complete Complete  Medication Review (RN Care Manager)  Referral to Pharmacy Complete  PCP or Specialist appointment within 3-5 days of discharge Complete Complete Complete  HRI or Home Care Consult  Complete Complete  SW Recovery Care/Counseling Consult Complete Complete Complete  Palliative Care Screening Not Applicable Not Applicable Not AB[place]boro FarmNot Applicable Not Applicable Not Applicable

## 2022-03-16 NOTE — Progress Notes (Signed)
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 Now likely ESRD at this point. -ineffective diuresis with recurrent admissions for hypervolemia and now with uremic symptoms, Dr. Candiss Norse had a lengthy discussion in regards to renal replacement therapy, decision was made to start-TDC placed 7/25 followed by first HD  -second HD 7/26- clip in process     -Will hope to get AV access placed while here -  appreciate VVS seeing-  tentatively planned for tomorrow  -  then will plan for 3rd HD on Sat  Ready for discharge once OP clinic IDd +/- AV access placement - thinking now will be Saturday   Acute on chronic combined CHF -Does make urine which will make her HD more manageable- no diuretics  Chest pain -Per cardiology and primary.   Paroxysmal Afib -On amio 200 mg daily, metoprolol 25 mg bid -Eliquis on hold d/t dialysis catheter placement today, was on heparin in the interim  Anemia of chronic disease -Transfuse for Hb<7 Hgb dropping slowly with events-  getting iron and ESA   Bones-  PTH pending.  phos is not bad-  no binder   HTN-  dont think will tolerate hydralazine with low BP  Subjective:    No issues with second HD yesterday -  reports some tenderness at new [facility] site but is better   Objective Vital signs in last 24 hours: Vitals:   03/15/22 1327 03/15/22 1351 03/15/22 1956 03/16/22 0624  BP:  119/65 (!) 122/53 (!) 112/59  Pulse:  82 84 81  Resp:  16  18  Temp:  98.5 F (36.9 C) 97.9 F (36.6 C) 98.2 F (36.8 C)  TempSrc:  Oral Oral Oral  SpO2:  100% 99%   Weight: (!) 136.3 kg   127.7 kg  Height:       Weight change: 7.4 kg  Intake/Output Summary (Last 24 hours) at 03/16/2022 0732 Last data filed at 03/15/2022 2200 Gross per 24 hour  Intake 1192.25 ml  Output 1200.5 ml  Net -8.25 ml    Labs: Basic Metabolic Panel: Recent Labs  Lab 03/13/22 0652 03/14/22 1004 03/15/22 0200  03/16/22 0156  NA 138 137 137 137  K 3.7 3.4* 3.5 3.4*  CL 104 104 105 103  CO2 $Re'23 24 22 27  'IDP$ GLUCOSE 98 115* 146* 106*  BUN 67* 71* 58* 39*  CREATININE 6.78* 6.70* 5.47* 3.90*  CALCIUM 8.9 9.0 8.6* 8.9  PHOS 6.1* 5.6*  --   --    Liver Function Tests: Recent Labs  Lab 03/12/22 0758 03/13/22 0652 03/14/22 1004  AST 24  --   --   ALT 21  --   --   ALKPHOS 122  --   --   BILITOT 0.6  --   --   PROT 7.3  --   --   ALBUMIN 3.2* 3.1* 3.3*   No results for input(s): "LIPASE", "AMYLASE" in the last 168 hours. No results for input(s): "AMMONIA" in the last 168 hours. CBC: Recent Labs  Lab 03/13/22 0652 03/14/22 0213 03/14/22 1004 03/15/22 0200 03/16/22 0156  WBC 4.9 4.7 3.7* 5.3 5.8  HGB 9.5* 9.2* 9.5* 8.9* 9.2*  HCT 29.9* 29.2* 29.6* 27.0* 28.3*  MCV 81.9 83.2 83.6 82.8 82.5  PLT 153 152 146* 125* 132*   Cardiac Enzymes: No results for input(s): "CKTOTAL", "CKMB", "CKMBINDEX", "TROPONINI" in the last 168 hours. CBG: Recent Labs  Lab 03/14/22 2124 03/15/22 0747  03/15/22 1357 03/15/22 1657 03/15/22 2104  GLUCAP 156* 114* 86 132* 156*    Iron Studies:  No results for input(s): "IRON", "TIBC", "TRANSFERRIN", "FERRITIN" in the last 72 hours.  Studies/Results: VAS [place] UPPER EXT VEIN MAPPING (PRE-OP AVF)  Result Date: 03/15/2022 UPPER EXTREMITY VEIN MAPPING Patient Name:  [name]  Date of Exam:   03/15/2022 Medical Rec #: [id number]       Accession #:    [id number] Date of Birth: August 05, 1956       Patient Gender: F Patient Age:   66 years Exam Location:  [facility] Procedure:      VAS [place] UPPER EXT VEIN MAPPING (PRE-OP AVF) Referring Phys: Corliss Parish --------------------------------------------------------------------------------  Indications: Pre-access. Comparison Study: No prior studies. Performing Technologist: Oliver Hum RVT  Examination Guidelines: A complete evaluation includes B-mode imaging, spectral Doppler, color Doppler, and power  Doppler as needed of all accessible portions of each vessel. Bilateral testing is considered an integral part of a complete examination. Limited examinations for reoccurring indications may be performed as noted. +-----------------+-------------+----------+---------+ Right Cephalic   Diameter (cm)Depth (cm)Findings  +-----------------+-------------+----------+---------+ Shoulder             0.37        1.60             +-----------------+-------------+----------+---------+ Prox upper arm       0.30        1.60             +-----------------+-------------+----------+---------+ Mid upper arm        0.28        1.80   branching +-----------------+-------------+----------+---------+ Dist upper arm       0.20        1.30             +-----------------+-------------+----------+---------+ Antecubital fossa    0.33        0.38   branching +-----------------+-------------+----------+---------+ Prox forearm         0.23        0.77   branching +-----------------+-------------+----------+---------+ Mid forearm          0.16        0.83             +-----------------+-------------+----------+---------+ Dist forearm         0.18        0.51             +-----------------+-------------+----------+---------+ +-----------------+-------------+----------+---------+ Right Basilic    Diameter (cm)Depth (cm)Findings  +-----------------+-------------+----------+---------+ Shoulder             0.26        2.40             +-----------------+-------------+----------+---------+ Dist upper arm       0.32        2.10             +-----------------+-------------+----------+---------+ Antecubital fossa    0.27        1.90   branching +-----------------+-------------+----------+---------+ Prox forearm         0.08        0.85             +-----------------+-------------+----------+---------+ Mid forearm          0.04        0.37              +-----------------+-------------+----------+---------+ Distal forearm       0.05        0.20             +-----------------+-------------+----------+---------+ +-----------------+-------------+----------+---------+  Left Cephalic    Diameter (cm)Depth (cm)Findings  +-----------------+-------------+----------+---------+ Shoulder             0.31        1.40             +-----------------+-------------+----------+---------+ Prox upper arm       0.30        1.50   branching +-----------------+-------------+----------+---------+ Mid upper arm        0.26        1.50   branching +-----------------+-------------+----------+---------+ Dist upper arm       0.31        0.83             +-----------------+-------------+----------+---------+ Antecubital fossa    0.28        0.71   branching +-----------------+-------------+----------+---------+ Prox forearm         0.11        0.99             +-----------------+-------------+----------+---------+ Mid forearm          0.14        0.81             +-----------------+-------------+----------+---------+ Dist forearm         0.11        0.53             +-----------------+-------------+----------+---------+ +-----------------+-------------+----------+---------+ Left Basilic     Diameter (cm)Depth (cm)Findings  +-----------------+-------------+----------+---------+ Shoulder             0.27        2.60             +-----------------+-------------+----------+---------+ Mid upper arm        0.27        2.40             +-----------------+-------------+----------+---------+ Dist upper arm       0.28        2.00             +-----------------+-------------+----------+---------+ Antecubital fossa    0.29        2.00   branching +-----------------+-------------+----------+---------+ Prox forearm         0.16        0.91   branching +-----------------+-------------+----------+---------+ Mid forearm           0.08        0.72             +-----------------+-------------+----------+---------+ Distal forearm       0.04        0.44             +-----------------+-------------+----------+---------+ *See table(s) above for measurements and observations.  Diagnosing physician: Monica Martinez MD Electronically signed by Monica Martinez MD on 03/15/2022 at 6:25:27 PM.    Final    IR Fluoro Guide CV Line Right  Result Date: 03/14/2022 INDICATION: 66 year old female referred for tunneled hemodialysis catheter EXAM: TUNNELED CENTRAL VENOUS HEMODIALYSIS CATHETER PLACEMENT WITH ULTRASOUND AND FLUOROSCOPIC GUIDANCE MEDICATIONS: 2 g Ancef. The antibiotic was given in an appropriate time interval prior to skin puncture. ANESTHESIA/SEDATION: Moderate (conscious) sedation was employed during this procedure. A total of Versed 1.0 mg and Fentanyl 25 mcg was administered intravenously by the radiology nurse. Total intra-service moderate Sedation Time: 15 minutes. The patient's level of consciousness and vital signs were monitored continuously by radiology nursing throughout the procedure under my direct supervision. FLUOROSCOPY: Radiation Exposure Index (as provided by the fluoroscopic device): 2 mGy Kerma COMPLICATIONS: None PROCEDURE: Informed  written consent was obtained from the patient after a discussion of the risks, benefits, and alternatives to treatment. Questions regarding the procedure were encouraged and answered. The right neck and chest were prepped with chlorhexidine in a sterile fashion, and a sterile drape was applied covering the operative field. Maximum barrier sterile technique with sterile gowns and gloves were used for the procedure. A timeout was performed prior to the initiation of the procedure. Ultrasound survey was performed. The right internal jugular vein was confirmed to be patent, with images stored and sent to PACS. Micropuncture kit was utilized to access the right internal jugular vein under  direct, real-time ultrasound guidance after the overlying soft tissues were anesthetized with 1% lidocaine with epinephrine. Stab incision was made with 11 blade scalpel. Microwire was passed centrally. The microwire was then marked to measure appropriate internal catheter length. External tunneled length was estimated. A total tip to cuff length of 19 cm was selected. 035 guidewire was advanced to the level of the IVC. Skin and subcutaneous tissues of chest wall below the clavicle were generously infiltrated with 1% lidocaine for local anesthesia. A small stab incision was made with 11 blade scalpel. The selected hemodialysis catheter was tunneled in a retrograde fashion from the anterior chest wall to the venotomy incision. Serial dilation was performed and then a peel-away sheath was placed. The catheter was then placed through the peel-away sheath with tips ultimately positioned within the superior aspect of the right atrium. Final catheter positioning was confirmed and documented with a spot radiographic image. The catheter aspirates and flushes normally. The catheter was flushed with appropriate volume heparin dwells. The catheter exit site was secured with a 0-Prolene retention suture. Gel-Foam slurry was infused into the soft tissue tract. The venotomy incision was closed Derma bond and sterile dressing. Dressings were applied at the chest wall. Patient tolerated the procedure well and remained hemodynamically stable throughout. No complications were encountered and no significant blood loss encountered. IMPRESSION: Status post right IJ tunneled hemodialysis catheter Signed, Dulcy Fanny. Nadene Rubins, RPVI [facility] Radiology Electronically Signed   By: Corrie Mckusick D.O.   On: 03/14/2022 10:34   IR US Guide Vasc Access Right  Result Date: 03/14/2022 INDICATION: 66 year old female referred for tunneled hemodialysis catheter EXAM: TUNNELED CENTRAL VENOUS  HEMODIALYSIS CATHETER PLACEMENT WITH ULTRASOUND AND FLUOROSCOPIC GUIDANCE MEDICATIONS: 2 g Ancef. The antibiotic was given in an appropriate time interval prior to skin puncture. ANESTHESIA/SEDATION: Moderate (conscious) sedation was employed during this procedure. A total of Versed 1.0 mg and Fentanyl 25 mcg was administered intravenously by the radiology nurse. Total intra-service moderate Sedation Time: 15 minutes. The patient's level of consciousness and vital signs were monitored continuously by radiology nursing throughout the procedure under my direct supervision. FLUOROSCOPY: Radiation Exposure Index (as provided by the fluoroscopic device): 2 mGy Kerma COMPLICATIONS: None PROCEDURE: Informed written consent was obtained from the patient after a discussion of the risks, benefits, and alternatives to treatment. Questions regarding the procedure were encouraged and answered. The right neck and chest were prepped with chlorhexidine in a sterile fashion, and a sterile drape was applied covering the operative field. Maximum barrier sterile technique with sterile gowns and gloves were used for the procedure. A timeout was performed prior to the initiation of the procedure. Ultrasound survey was performed. The right internal jugular vein was confirmed to be patent, with images stored and sent to PACS. Micropuncture kit was utilized to access the right internal jugular vein  under direct, real-time ultrasound guidance after the overlying soft tissues were anesthetized with 1% lidocaine with epinephrine. Stab incision was made with 11 blade scalpel. Microwire was passed centrally. The microwire was then marked to measure appropriate internal catheter length. External tunneled length was estimated. A total tip to cuff length of 19 cm was selected. 035 guidewire was advanced to the level of the IVC. Skin and subcutaneous tissues of chest wall below the clavicle were generously infiltrated with 1% lidocaine for local  anesthesia. A small stab incision was made with 11 blade scalpel. The selected hemodialysis catheter was tunneled in a retrograde fashion from the anterior chest wall to the venotomy incision. Serial dilation was performed and then a peel-away sheath was placed. The catheter was then placed through the peel-away sheath with tips ultimately positioned within the superior aspect of the right atrium. Final catheter positioning was confirmed and documented with a spot radiographic image. The catheter aspirates and flushes normally. The catheter was flushed with appropriate volume heparin dwells. The catheter exit site was secured with a 0-Prolene retention suture. Gel-Foam slurry was infused into the soft tissue tract. The venotomy incision was closed Derma bond and sterile dressing. Dressings were applied at the chest wall. Patient tolerated the procedure well and remained hemodynamically stable throughout. No complications were encountered and no significant blood loss encountered. IMPRESSION: Status post right IJ tunneled hemodialysis catheter Signed, Dulcy Fanny. Nadene Rubins, RPVI [facility] Radiology Electronically Signed   By: Corrie Mckusick D.O.   On: 03/14/2022 10:34    Medications: Infusions:  sodium chloride Stopped (03/14/22 1900)   [START ON 03/17/2022]  ceFAZolin (ANCEF) IV     ferric gluconate (FERRLECIT) IVPB Stopped (03/15/22 1040)    Scheduled Medications:  amiodarone  200 mg Oral Daily   Chlorhexidine Gluconate Cloth  6 each Topical Q0600   darbepoetin (ARANESP) injection - NON-DIALYSIS  60 mcg Subcutaneous Q Mon-1800   famotidine  20 mg Oral Daily   hydrALAZINE  10 mg Oral Q8H   insulin aspart  0-5 Units Subcutaneous QHS   insulin aspart  0-6 Units Subcutaneous TID WC   isosorbide mononitrate  15 mg Oral Daily   metoprolol succinate  25 mg Oral BID   rosuvastatin  10 mg Oral Daily    have reviewed scheduled and prn  medications.  Physical Exam: General: Resting comfortably, no acute distress Heart: Regular rate, rhythm. No murmurs. Lungs: Normal respiratory effort. Clear to auscultation bilaterally. Abdomen: Soft, non-tender, obese abdomen Extremities: No pitting edema in lower extremities Neuro: Awake, alert, conversing appropriately. No focal deficits. Dialysis Access: right IJ tunneled HD cath    Juanangel Soderholm A Cyruss Arata  03/16/2022,7:32 AM  LOS: 6 days

## 2022-03-16 NOTE — Progress Notes (Signed)
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  Admitted with heart failure exacerbation and atypical chest pain.  Followed by cardiology.  Remains in the hospital due to persistent symptoms. She developed significant renal dysfunction and oliguria, started on dialysis. Remains in the hospital.  Will need outpatient dialysis.   Assessment & Plan:   Acute on chronic systolic and diastolic congestive heart failure: Fluid overload.  Failed diuretic challenge.  Now on hemodialysis.  Stabilizing.  Obstructive sleep apnea: Currently untreated.  Will need a new study and device.  Chest pain: Atypical.  Persistent epigastric and retrosternal pain.  EKG nonischemic.  Troponins nonischemic.  Prior cardiac catheterization with minimal coronary artery disease.  Improved now.  Acute kidney injury on chronic kidney disease stage IV: Now ESRD.  Worsening renal functions.  Monitor closely.  Patient received contrast to rule out dissection.   Permacath placed.  Receiving inpatient dialysis. Vascular surgery scheduled for AV fistula creation tomorrow. Patient will need outpatient dialysis chair.  Paroxysmal A-fib: Currently in sinus rhythm.  On amiodarone.  Therapeutic on Eliquis.  On hold today.  Valvular heart disease, mitral valve repair tricuspid valve repair with residual severe tricuspid regurgitation: Probably exacerbating fluid imbalance.  Seen by structural heart team, advised she is not able to have surgery.  Hypokalemia: Replaced.  Adequate.  DVT prophylaxis:   Heparin infusion.   Code Status: Full code Family  Communication: None Disposition Plan: Status is: Inpatient.  Getting hemodialysis.  Will need outpatient dialysis before discharge planning.  Consultants:  Cardiology Nephrology  Procedures:  Permacath  Antimicrobials:  None   Subjective:  Seen and examined.  No new events.  Denies any shortness of breath.  No more substernal pain today.  Objective: Vitals:   03/15/22 1956 03/16/22 0624 03/16/22 1057 03/16/22 1132  BP: (!) 122/53 (!) 112/59 (!) 125/59 (!) 125/59  Pulse: 84 81 79 79  Resp:  18  18  Temp: 97.9 F (36.6 C) 98.2 F (36.8 C)  98 F (36.7 C)  TempSrc: Oral Oral  Oral  SpO2: 99%   99%  Weight:  127.7 kg    Height:        Intake/Output Summary (Last 24 hours) at 03/16/2022 1135 Last data filed at 03/16/2022 0700 Gross per 24 hour  Intake 752.25 ml  Output 1100.5 ml  Net -348.25 ml    Filed Weights   03/15/22 0948 03/15/22 1327 03/16/22 0624  Weight: (!) 136.6 kg (!) 136.3 kg 127.7 kg    Examination:  General exam: Comfortable on room air. Right IJ tunnel catheter looks clean and dry. Respiratory system: Difficult to assess.  No added sounds. Cardiovascular system: S1 & S2 heard, RRR.   Gastrointestinal system: Soft.  Nontender.  No rigidity or guarding. Central nervous system: Alert and oriented. No focal neurological deficits. Extremities: Symmetric 5 x 5 power.    Data Reviewed: I have personally reviewed following labs and imaging studies  CBC: Recent Labs  Lab 03/13/22 0652 03/14/22 0213 03/14/22 1004 03/15/22 0200 03/16/22 0156  WBC 4.9 4.7 3.7* 5.3 5.8  HGB 9.5* 9.2* 9.5* 8.9* 9.2*  HCT 29.9* 29.2* 29.6* 27.0* 28.3*  MCV 81.9  83.2 83.6 82.8 82.5  PLT 153 152 146* 125* 132*    Basic Metabolic Panel: Recent Labs  Lab 03/10/22 0233 03/11/22 0158 03/12/22 0758 03/13/22 0652 03/14/22 1004 03/15/22 0200 03/16/22 0156  NA 137 137 139 138 137 137 137  K 3.2* 3.1* 3.9 3.7 3.4* 3.5 3.4*  CL 101 101 104 104 104 105 103  CO2 '25  27 26 23 24 22 27  '$ GLUCOSE 103* 100* 109* 98 115* 146* 106*  BUN 36* 43* 58* 67* 71* 58* 39*  CREATININE 3.12* 4.77* 6.41* 6.78* 6.70* 5.47* 3.90*  CALCIUM 8.9 8.8* 9.1 8.9 9.0 8.6* 8.9  MG 1.8 1.8 2.0  --   --   --   --   PHOS  --   --   --  6.1* 5.6*  --   --     GFR: Estimated Creatinine Clearance: 20 mL/min (A) (by C-G formula based on SCr of 3.9 mg/dL (H)). Liver Function Tests: Recent Labs  Lab 03/12/22 0758 03/13/22 0652 03/14/22 1004  AST 24  --   --   ALT 21  --   --   ALKPHOS 122  --   --   BILITOT 0.6  --   --   PROT 7.3  --   --   ALBUMIN 3.2* 3.1* 3.3*    No results for input(s): "LIPASE", "AMYLASE" in the last 168 hours. No results for input(s): "AMMONIA" in the last 168 hours. Coagulation Profile: No results for input(s): "INR", "PROTIME" in the last 168 hours. Cardiac Enzymes: No results for input(s): "CKTOTAL", "CKMB", "CKMBINDEX", "TROPONINI" in the last 168 hours. BNP (last 3 results) No results for input(s): "PROBNP" in the last 8760 hours. HbA1C: No results for input(s): "HGBA1C" in the last 72 hours. CBG: Recent Labs  Lab 03/15/22 1357 03/15/22 1657 03/15/22 2104 03/16/22 0745 03/16/22 1130  GLUCAP 86 132* 156* 131* 99    Lipid Profile: No results for input(s): "CHOL", "HDL", "LDLCALC", "TRIG", "CHOLHDL", "LDLDIRECT" in the last 72 hours. Thyroid Function Tests: No results for input(s): "TSH", "T4TOTAL", "FREET4", "T3FREE", "THYROIDAB" in the last 72 hours. Anemia Panel: No results for input(s): "VITAMINB12", "FOLATE", "FERRITIN", "TIBC", "IRON", "RETICCTPCT" in the last 72 hours.  Sepsis Labs: No results for input(s): "PROCALCITON", "LATICACIDVEN" in the last 168 hours.  Recent Results (from the past 240 hour(s))  SARS Coronavirus 2 by RT PCR (hospital order, performed in [facility] [place] hospital lab) *cepheid single result test* Anterior Nasal Swab     Status: None   Collection Time: 03/08/22  3:55 PM   Specimen: Anterior Nasal Swab   Result Value Ref Range Status   SARS Coronavirus 2 by RT PCR NEGATIVE NEGATIVE Final    Comment: (NOTE) SARS-CoV-2 target nucleic acids are NOT DETECTED.  The SARS-CoV-2 RNA is generally detectable in upper and lower respiratory specimens during the acute phase of infection. The lowest concentration of SARS-CoV-2 viral copies this assay can detect is 250 copies / mL. A negative result does not preclude SARS-CoV-2 infection and should not be used as the sole basis for treatment or other patient management decisions.  A negative result may occur with improper specimen collection / handling, submission of specimen other than nasopharyngeal swab, presence of viral mutation(s) within the areas targeted by this assay, and inadequate number of viral copies (<250 copies / mL). A negative result must be combined with clinical observations, patient history, and epidemiological information.  Fact Sheet for Patients:   https://www.patel.info/  Fact Sheet for Healthcare  Providers: https://hall.com/  This test is not yet approved or  cleared by the [place] and has been authorized for detection and/or diagnosis of SARS-CoV-2 by FDA under an Emergency Use Authorization (EUA).  This EUA will remain in effect (meaning this test can be used) for the duration of the COVID-19 declaration under Section 564(b)(1) of the Act, 21 U.S.C. section 360bbb-3(b)(1), unless the authorization is terminated or revoked sooner.  Performed at [facility], [address], [place], [place] [address]   Surgical PCR screen     Status: None   Collection Time: 03/12/22  9:20 PM   Specimen: Nasal Mucosa; Nasal Swab  Result Value Ref Range Status   MRSA, PCR NEGATIVE NEGATIVE Final   Staphylococcus aureus NEGATIVE NEGATIVE Final    Comment: (NOTE) The Xpert SA Assay (FDA approved for NASAL specimens in patients 46 years of age and older), is one  component of a comprehensive surveillance program. It is not intended to diagnose infection nor to guide or monitor treatment. Performed at [facility], [address] [place], [place] [address]          Radiology Studies: VAS [place] UPPER EXT VEIN MAPPING (PRE-OP AVF)  Result Date: 03/15/2022 UPPER EXTREMITY VEIN MAPPING Patient Name:  [name]  Date of Exam:   03/15/2022 Medical Rec #: [id number]       Accession #:    [id number] Date of Birth: August 29, 1955       Patient Gender: F Patient Age:   43 years Exam [facility]   [facility] Procedure:      VAS [place] UPPER EXT VEIN MAPPING (PRE-OP AVF) Referring Phys: Corliss Parish --------------------------------------------------------------------------------  Indications: Pre-access. Comparison Study: No prior studies. Performing Technologist: Oliver Hum RVT  Examination Guidelines: A complete evaluation includes B-mode imaging, spectral Doppler, color Doppler, and power Doppler as needed of all accessible portions of each vessel. Bilateral testing is considered an integral part of a complete examination. Limited examinations for reoccurring indications may be performed as noted. +-----------------+-------------+----------+---------+ Right Cephalic   Diameter (cm)Depth (cm)Findings  +-----------------+-------------+----------+---------+ Shoulder             0.37        1.60             +-----------------+-------------+----------+---------+ Prox upper arm       0.30        1.60             +-----------------+-------------+----------+---------+ Mid upper arm        0.28        1.80   branching +-----------------+-------------+----------+---------+ Dist upper arm       0.20        1.30             +-----------------+-------------+----------+---------+ Antecubital fossa    0.33        0.38   branching +-----------------+-------------+----------+---------+ Prox forearm         0.23        0.77   branching  +-----------------+-------------+----------+---------+ Mid forearm          0.16        0.83             +-----------------+-------------+----------+---------+ Dist forearm         0.18        0.51             +-----------------+-------------+----------+---------+ +-----------------+-------------+----------+---------+ Right Basilic    Diameter (cm)Depth (cm)Findings  +-----------------+-------------+----------+---------+ Shoulder  0.26        2.40             +-----------------+-------------+----------+---------+ Dist upper arm       0.32        2.10             +-----------------+-------------+----------+---------+ Antecubital fossa    0.27        1.90   branching +-----------------+-------------+----------+---------+ Prox forearm         0.08        0.85             +-----------------+-------------+----------+---------+ Mid forearm          0.04        0.37             +-----------------+-------------+----------+---------+ Distal forearm       0.05        0.20             +-----------------+-------------+----------+---------+ +-----------------+-------------+----------+---------+ Left Cephalic    Diameter (cm)Depth (cm)Findings  +-----------------+-------------+----------+---------+ Shoulder             0.31        1.40             +-----------------+-------------+----------+---------+ Prox upper arm       0.30        1.50   branching +-----------------+-------------+----------+---------+ Mid upper arm        0.26        1.50   branching +-----------------+-------------+----------+---------+ Dist upper arm       0.31        0.83             +-----------------+-------------+----------+---------+ Antecubital fossa    0.28        0.71   branching +-----------------+-------------+----------+---------+ Prox forearm         0.11        0.99             +-----------------+-------------+----------+---------+ Mid forearm           0.14        0.81             +-----------------+-------------+----------+---------+ Dist forearm         0.11        0.53             +-----------------+-------------+----------+---------+ +-----------------+-------------+----------+---------+ Left Basilic     Diameter (cm)Depth (cm)Findings  +-----------------+-------------+----------+---------+ Shoulder             0.27        2.60             +-----------------+-------------+----------+---------+ Mid upper arm        0.27        2.40             +-----------------+-------------+----------+---------+ Dist upper arm       0.28        2.00             +-----------------+-------------+----------+---------+ Antecubital fossa    0.29        2.00   branching +-----------------+-------------+----------+---------+ Prox forearm         0.16        0.91   branching +-----------------+-------------+----------+---------+ Mid forearm          0.08        0.72             +-----------------+-------------+----------+---------+ Distal forearm       0.04  0.44             +-----------------+-------------+----------+---------+ *See table(s) above for measurements and observations.  Diagnosing physician: Monica Martinez MD Electronically signed by Monica Martinez MD on 03/15/2022 at 6:25:27 PM.    Final         Scheduled Meds:  amiodarone  200 mg Oral Daily   Chlorhexidine Gluconate Cloth  6 each Topical Q0600   darbepoetin (ARANESP) injection - NON-DIALYSIS  60 mcg Subcutaneous Q Mon-1800   famotidine  20 mg Oral Daily   insulin aspart  0-5 Units Subcutaneous QHS   insulin aspart  0-6 Units Subcutaneous TID WC   isosorbide mononitrate  15 mg Oral Daily   metoprolol succinate  25 mg Oral BID   rosuvastatin  10 mg Oral Daily   Continuous Infusions:  sodium chloride Stopped (03/14/22 1900)   [START ON 03/17/2022]  ceFAZolin (ANCEF) IV     ferric gluconate (FERRLECIT) IVPB 250 mg (03/16/22 1106)     LOS: 6  days    Time spent: 25 minutes    Barb Merino, MD [facility] Pager [phone]

## 2022-03-16 NOTE — H&P (View-Only) (Signed)
  Progress Note    03/16/2022 6:56 AM Hospital Day 7  Subjective:  no complaints; no questions  afebrile  Vitals:   03/15/22 1956 03/16/22 0624  BP: (!) 122/53 (!) 112/59  Pulse: 84 81  Resp:  18  Temp: 97.9 F (36.6 C) 98.2 F (36.8 C)  SpO2: 99%     Physical Exam: General:  resting comfortably in bed   CBC    Component Value Date/Time   WBC 5.8 03/16/2022 0156   RBC 3.43 (L) 03/16/2022 0156   HGB 9.2 (L) 03/16/2022 0156   HGB 10.5 (L) 04/14/2021 0957   HCT 28.3 (L) 03/16/2022 0156   HCT 32.5 (L) 04/14/2021 0957   PLT 132 (L) 03/16/2022 0156   PLT 216 04/14/2021 0957   MCV 82.5 03/16/2022 0156   MCV 85 04/14/2021 0957   MCH 26.8 03/16/2022 0156   MCHC 32.5 03/16/2022 0156   RDW 19.1 (H) 03/16/2022 0156   RDW 15.3 04/14/2021 0957   LYMPHSABS 1.8 11/22/2021 0244   LYMPHSABS 2.6 10/07/2019 1337   MONOABS 1.4 (H) 11/22/2021 0244   EOSABS 0.2 11/22/2021 0244   EOSABS 0.2 10/07/2019 1337   BASOSABS 0.1 11/22/2021 0244   BASOSABS 0.0 10/07/2019 1337    BMET    Component Value Date/Time   NA 137 03/16/2022 0156   NA 142 01/11/2022 1134   K 3.4 (L) 03/16/2022 0156   CL 103 03/16/2022 0156   CO2 27 03/16/2022 0156   GLUCOSE 106 (H) 03/16/2022 0156   BUN 39 (H) 03/16/2022 0156   BUN 20 01/11/2022 1134   CREATININE 3.90 (H) 03/16/2022 0156   CALCIUM 8.9 03/16/2022 0156   GFRNONAA 12 (L) 03/16/2022 0156   GFRAA 33 (L) 09/08/2020 1054    INR    Component Value Date/Time   INR 2.3 (H) 11/24/2021 2151     Intake/Output Summary (Last 24 hours) at 03/16/2022 0656 Last data filed at 03/15/2022 2200 Gross per 24 hour  Intake 1192.25 ml  Output 1200.5 ml  Net -8.25 ml     Assessment/Plan:  66 y.o. female with AKI on CKD 4 in need of dialysis access Hospital Day 7  -vein mapping reveals left cephalic vein probably suitable for left BC AVF but will evaluate in OR tomorrow -npo after MN/consent/labs -I have asked the nurse to remove the IV from the left  arm.  Will restrict left arm -discussed with pt that HD access does not last forever, may need additional procedures to help maturation or may not even mature at all and have to start over.    Leontine Locket, PA-C [facility] [phone] 03/16/2022 6:56 AM  I have seen and evaluated the patient. I agree with the PA note as documented above.  Discussed plan for left arm AV fistula versus graft tomorrow in the OR with Dr. Scot Dock.  Please keep n.p.o. after midnight.  Continue to hold Eliquis.  Vein mapping shows likely suitable cephalic vein but will evaluate in the OR.  Risk benefits again discussed.  Questions answered.  Please remove IV from the left arm.  Marty Heck, MD [facility] [place] Office: [phone]

## 2022-03-16 NOTE — Progress Notes (Addendum)
  Progress Note    03/16/2022 6:56 AM Hospital Day 7  Subjective:  no complaints; no questions  afebrile  Vitals:   03/15/22 1956 03/16/22 0624  BP: (!) 122/53 (!) 112/59  Pulse: 84 81  Resp:  18  Temp: 97.9 F (36.6 C) 98.2 F (36.8 C)  SpO2: 99%     Physical Exam: General:  resting comfortably in bed   CBC    Component Value Date/Time   WBC 5.8 03/16/2022 0156   RBC 3.43 (L) 03/16/2022 0156   HGB 9.2 (L) 03/16/2022 0156   HGB 10.5 (L) 04/14/2021 0957   HCT 28.3 (L) 03/16/2022 0156   HCT 32.5 (L) 04/14/2021 0957   PLT 132 (L) 03/16/2022 0156   PLT 216 04/14/2021 0957   MCV 82.5 03/16/2022 0156   MCV 85 04/14/2021 0957   MCH 26.8 03/16/2022 0156   MCHC 32.5 03/16/2022 0156   RDW 19.1 (H) 03/16/2022 0156   RDW 15.3 04/14/2021 0957   LYMPHSABS 1.8 11/22/2021 0244   LYMPHSABS 2.6 10/07/2019 1337   MONOABS 1.4 (H) 11/22/2021 0244   EOSABS 0.2 11/22/2021 0244   EOSABS 0.2 10/07/2019 1337   BASOSABS 0.1 11/22/2021 0244   BASOSABS 0.0 10/07/2019 1337    BMET    Component Value Date/Time   NA 137 03/16/2022 0156   NA 142 01/11/2022 1134   K 3.4 (L) 03/16/2022 0156   CL 103 03/16/2022 0156   CO2 27 03/16/2022 0156   GLUCOSE 106 (H) 03/16/2022 0156   BUN 39 (H) 03/16/2022 0156   BUN 20 01/11/2022 1134   CREATININE 3.90 (H) 03/16/2022 0156   CALCIUM 8.9 03/16/2022 0156   GFRNONAA 12 (L) 03/16/2022 0156   GFRAA 33 (L) 09/08/2020 1054    INR    Component Value Date/Time   INR 2.3 (H) 11/24/2021 2151     Intake/Output Summary (Last 24 hours) at 03/16/2022 0656 Last data filed at 03/15/2022 2200 Gross per 24 hour  Intake 1192.25 ml  Output 1200.5 ml  Net -8.25 ml     Assessment/Plan:  66 y.o. female with AKI on CKD 4 in need of dialysis access Hospital Day 7  -vein mapping reveals left cephalic vein probably suitable for left BC AVF but will evaluate in OR tomorrow -npo after MN/consent/labs -I have asked the nurse to remove the IV from the left  arm.  Will restrict left arm -discussed with pt that HD access does not last forever, may need additional procedures to help maturation or may not even mature at all and have to start over.    Leontine Locket, PA-C [facility] [phone] 03/16/2022 6:56 AM  I have seen and evaluated the patient. I agree with the PA note as documented above.  Discussed plan for left arm AV fistula versus graft tomorrow in the OR with Dr. Scot Dock.  Please keep n.p.o. after midnight.  Continue to hold Eliquis.  Vein mapping shows likely suitable cephalic vein but will evaluate in the OR.  Risk benefits again discussed.  Questions answered.  Please remove IV from the left arm.  Marty Heck, MD [facility] [place] Office: [phone]

## 2022-03-16 NOTE — Progress Notes (Signed)
Mobility Specialist Progress Note    03/16/22 1403  Mobility  Activity Ambulated with assistance in hallway  Level of Assistance Standby assist, set-up cues, supervision of patient - no hands on  Assistive Device Four wheel walker  Distance Ambulated (ft) 260 ft  Activity Response Tolerated well  $Mobility charge 1 Mobility   Pt received and agreeable. No complaints on walk. Took x1 extended seated rest break. Returned to sitting EOB with call bell in reach.    Hildred Alamin Mobility Specialist

## 2022-03-17 ENCOUNTER — Inpatient Hospital Stay (HOSPITAL_COMMUNITY): Payer: 59 | Admitting: Certified Registered"

## 2022-03-17 ENCOUNTER — Other Ambulatory Visit: Payer: Self-pay

## 2022-03-17 ENCOUNTER — Encounter (HOSPITAL_COMMUNITY)
Admission: EM | Disposition: A | Discharge status: Home / Self Care | Payer: Self-pay | Source: Home / Self Care | Attending: Internal Medicine

## 2022-03-17 ENCOUNTER — Encounter (HOSPITAL_COMMUNITY): Payer: Self-pay | Admitting: Internal Medicine

## 2022-03-17 DIAGNOSIS — N185 Chronic kidney disease, stage 5: Secondary | ICD-10-CM | POA: Diagnosis not present

## 2022-03-17 DIAGNOSIS — E1122 Type 2 diabetes mellitus with diabetic chronic kidney disease: Secondary | ICD-10-CM

## 2022-03-17 DIAGNOSIS — I480 Paroxysmal atrial fibrillation: Secondary | ICD-10-CM | POA: Diagnosis not present

## 2022-03-17 DIAGNOSIS — I130 Hypertensive heart and chronic kidney disease with heart failure and stage 1 through stage 4 chronic kidney disease, or unspecified chronic kidney disease: Secondary | ICD-10-CM

## 2022-03-17 DIAGNOSIS — I509 Heart failure, unspecified: Secondary | ICD-10-CM

## 2022-03-17 DIAGNOSIS — N184 Chronic kidney disease, stage 4 (severe): Secondary | ICD-10-CM

## 2022-03-17 DIAGNOSIS — I5043 Acute on chronic combined systolic (congestive) and diastolic (congestive) heart failure: Secondary | ICD-10-CM | POA: Diagnosis not present

## 2022-03-17 HISTORY — PX: FISTULA SUPERFICIALIZATION: SHX6341

## 2022-03-17 HISTORY — PX: AV FISTULA PLACEMENT: SHX1204

## 2022-03-17 LAB — CBC
HCT: 28.8 % — ABNORMAL LOW (ref 36.0–46.0)
Hemoglobin: 9.1 g/dL — ABNORMAL LOW (ref 12.0–15.0)
MCH: 26.6 pg (ref 26.0–34.0)
MCHC: 31.6 g/dL (ref 30.0–36.0)
MCV: 84.2 fL (ref 80.0–100.0)
Platelets: 129 10*3/uL — ABNORMAL LOW (ref 150–400)
RBC: 3.42 MIL/uL — ABNORMAL LOW (ref 3.87–5.11)
RDW: 19.5 % — ABNORMAL HIGH (ref 11.5–15.5)
WBC: 6.5 10*3/uL (ref 4.0–10.5)
nRBC: 0 % (ref 0.0–0.2)

## 2022-03-17 LAB — PTH, INTACT AND CALCIUM
Calcium, Total (PTH): 8.8 mg/dL (ref 8.7–10.3)
PTH: 85 pg/mL — ABNORMAL HIGH (ref 15–65)

## 2022-03-17 LAB — BASIC METABOLIC PANEL
Anion gap: 10 (ref 5–15)
BUN: 47 mg/dL — ABNORMAL HIGH (ref 8–23)
CO2: 24 mmol/L (ref 22–32)
Calcium: 9 mg/dL (ref 8.9–10.3)
Chloride: 103 mmol/L (ref 98–111)
Creatinine, Ser: 4.62 mg/dL — ABNORMAL HIGH (ref 0.44–1.00)
GFR, Estimated: 10 mL/min — ABNORMAL LOW (ref >60)
Glucose, Bld: 135 mg/dL — ABNORMAL HIGH (ref 70–99)
Potassium: 3.4 mmol/L — ABNORMAL LOW (ref 3.5–5.1)
Sodium: 137 mmol/L (ref 135–145)

## 2022-03-17 LAB — GLUCOSE, CAPILLARY
Glucose-Capillary: 101 mg/dL — ABNORMAL HIGH (ref 70–99)
Glucose-Capillary: 99 mg/dL (ref 70–99)
Glucose-Capillary: 194 mg/dL — ABNORMAL HIGH (ref 70–99)
Glucose-Capillary: 127 mg/dL — ABNORMAL HIGH (ref 70–99)
Glucose-Capillary: 161 mg/dL — ABNORMAL HIGH (ref 70–99)

## 2022-03-17 SURGERY — ARTERIOVENOUS (AV) FISTULA CREATION
Anesthesia: Monitor Anesthesia Care | Site: Arm Upper | Laterality: Left

## 2022-03-17 MED ORDER — PHENYLEPHRINE 80 MCG/ML (10ML) SYRINGE FOR IV PUSH (FOR BLOOD PRESSURE SUPPORT)
PREFILLED_SYRINGE | INTRAVENOUS | Status: DC | PRN
  Administered 2022-03-17 (×2): 80 ug via INTRAVENOUS

## 2022-03-17 MED ORDER — TORSEMIDE 20 MG PO TABS
60.0000 mg | ORAL_TABLET | Freq: Every morning | ORAL | Status: DC
  Administered 2022-03-17: 60 mg via ORAL
  Filled 2022-03-17: qty 3

## 2022-03-17 MED ORDER — PROPOFOL 500 MG/50ML IV EMUL
INTRAVENOUS | Status: DC | PRN
  Administered 2022-03-17: 60 ug/kg/min via INTRAVENOUS

## 2022-03-17 MED ORDER — DIPHENHYDRAMINE HCL 25 MG PO CAPS
25.0000 mg | ORAL_CAPSULE | Freq: Four times a day (QID) | ORAL | Status: DC | PRN
Start: 2022-03-17 — End: 2022-03-18

## 2022-03-17 MED ORDER — PHENYLEPHRINE HCL (PRESSORS) 10 MG/ML IV SOLN
INTRAVENOUS | Status: AC
  Filled 2022-03-17: qty 1

## 2022-03-17 MED ORDER — CYCLOSPORINE 0.05 % OP EMUL
1.0000 [drp] | Freq: Two times a day (BID) | OPHTHALMIC | Status: DC
Start: 2022-03-17 — End: 2022-03-18
  Administered 2022-03-17 (×2): 1 [drp] via OPHTHALMIC
  Filled 2022-03-17 (×4): qty 30

## 2022-03-17 MED ORDER — FENTANYL CITRATE (PF) 100 MCG/2ML IJ SOLN
INTRAMUSCULAR | Status: DC | PRN
  Administered 2022-03-17 (×2): 50 ug via INTRAVENOUS

## 2022-03-17 MED ORDER — NITROGLYCERIN 0.4 MG SL SUBL: 0.4000 mg | SUBLINGUAL_TABLET | SUBLINGUAL | Status: DC | PRN

## 2022-03-17 MED ORDER — HEPARIN SODIUM (PORCINE) 1000 UNIT/ML IJ SOLN
INTRAMUSCULAR | Status: AC
  Filled 2022-03-17: qty 10

## 2022-03-17 MED ORDER — CHLORHEXIDINE GLUCONATE CLOTH 2 % EX PADS
6.0000 | MEDICATED_PAD | Freq: Every day | CUTANEOUS | Status: DC
  Administered 2022-03-18: 6 via TOPICAL

## 2022-03-17 MED ORDER — 0.9 % SODIUM CHLORIDE (POUR BTL) OPTIME
TOPICAL | Status: DC | PRN
  Administered 2022-03-17: 1000 mL

## 2022-03-17 MED ORDER — PROPOFOL 10 MG/ML IV BOLUS
INTRAVENOUS | Status: AC
Start: 2022-03-17 — End: ?
  Filled 2022-03-17: qty 20

## 2022-03-17 MED ORDER — DULAGLUTIDE 1.5 MG/0.5ML ~~LOC~~ SOAJ: 1.5000 mg | SUBCUTANEOUS | Status: DC

## 2022-03-17 MED ORDER — PROTAMINE SULFATE 10 MG/ML IV SOLN
INTRAVENOUS | Status: DC | PRN
  Administered 2022-03-17: 50 mg via INTRAVENOUS

## 2022-03-17 MED ORDER — POTASSIUM CHLORIDE CRYS ER 20 MEQ PO TBCR
60.0000 meq | EXTENDED_RELEASE_TABLET | Freq: Once | ORAL | Status: AC
  Administered 2022-03-17: 60 meq via ORAL
  Filled 2022-03-17: qty 3

## 2022-03-17 MED ORDER — METOPROLOL SUCCINATE ER 25 MG PO TB24
25.0000 mg | ORAL_TABLET | Freq: Every day | ORAL | Status: DC
Start: 2022-03-17 — End: 2022-03-18
  Administered 2022-03-17 – 2022-03-18 (×2): 25 mg via ORAL
  Filled 2022-03-17 (×2): qty 1

## 2022-03-17 MED ORDER — ONDANSETRON HCL 4 MG/2ML IJ SOLN: 4.0000 mg | Freq: Four times a day (QID) | INTRAMUSCULAR | Status: DC | PRN

## 2022-03-17 MED ORDER — PHENYLEPHRINE 80 MCG/ML (10ML) SYRINGE FOR IV PUSH (FOR BLOOD PRESSURE SUPPORT)
PREFILLED_SYRINGE | INTRAVENOUS | Status: AC
  Filled 2022-03-17: qty 10

## 2022-03-17 MED ORDER — ALBUTEROL SULFATE (2.5 MG/3ML) 0.083% IN NEBU: 2.5000 mg | INHALATION_SOLUTION | Freq: Four times a day (QID) | RESPIRATORY_TRACT | Status: DC | PRN

## 2022-03-17 MED ORDER — ALBUTEROL SULFATE HFA 108 (90 BASE) MCG/ACT IN AERS: 2.0000 | INHALATION_SPRAY | Freq: Four times a day (QID) | RESPIRATORY_TRACT | Status: DC | PRN

## 2022-03-17 MED ORDER — OXYCODONE HCL 5 MG/5ML PO SOLN: 5.0000 mg | Freq: Once | ORAL | Status: DC | PRN

## 2022-03-17 MED ORDER — SENNOSIDES-DOCUSATE SODIUM 8.6-50 MG PO TABS
1.0000 | ORAL_TABLET | Freq: Two times a day (BID) | ORAL | Status: DC
  Administered 2022-03-17: 1 via ORAL
  Filled 2022-03-17 (×2): qty 1

## 2022-03-17 MED ORDER — LORATADINE 10 MG PO TABS
10.0000 mg | ORAL_TABLET | Freq: Every day | ORAL | Status: DC
  Administered 2022-03-17 – 2022-03-18 (×2): 10 mg via ORAL
  Filled 2022-03-17 (×2): qty 1

## 2022-03-17 MED ORDER — ROSUVASTATIN CALCIUM 5 MG PO TABS: 10.0000 mg | ORAL_TABLET | Freq: Every evening | ORAL | Status: DC

## 2022-03-17 MED ORDER — PHENYLEPHRINE HCL-NACL 20-0.9 MG/250ML-% IV SOLN
INTRAVENOUS | Status: DC | PRN
  Administered 2022-03-17: 10 ug/min via INTRAVENOUS

## 2022-03-17 MED ORDER — SODIUM CHLORIDE 0.9 % IV SOLN: INTRAVENOUS | Status: DC | PRN

## 2022-03-17 MED ORDER — OXYCODONE-ACETAMINOPHEN 5-325 MG PO TABS
1.0000 | ORAL_TABLET | ORAL | Status: DC | PRN
  Administered 2022-03-17 – 2022-03-18 (×2): 2 via ORAL
  Filled 2022-03-17 (×2): qty 2

## 2022-03-17 MED ORDER — FENTANYL CITRATE (PF) 250 MCG/5ML IJ SOLN
INTRAMUSCULAR | Status: AC
  Filled 2022-03-17: qty 5

## 2022-03-17 MED ORDER — PREGABALIN 100 MG PO CAPS: 100.0000 mg | ORAL_CAPSULE | Freq: Three times a day (TID) | ORAL | Status: DC | PRN

## 2022-03-17 MED ORDER — LINACLOTIDE 72 MCG PO CAPS
72.0000 ug | ORAL_CAPSULE | Freq: Every morning | ORAL | Status: DC
  Administered 2022-03-17: 72 ug via ORAL
  Filled 2022-03-17 (×2): qty 1

## 2022-03-17 MED ORDER — FLUTICASONE PROPIONATE 50 MCG/ACT NA SUSP
2.0000 | Freq: Every day | NASAL | Status: DC
  Administered 2022-03-17 – 2022-03-18 (×2): 2 via NASAL
  Filled 2022-03-17: qty 16

## 2022-03-17 MED ORDER — AMIODARONE HCL 200 MG PO TABS: 200.0000 mg | ORAL_TABLET | Freq: Every day | ORAL | Status: DC

## 2022-03-17 MED ORDER — PROPOFOL 10 MG/ML IV BOLUS
INTRAVENOUS | Status: DC | PRN
  Administered 2022-03-17 (×3): 10 mg via INTRAVENOUS

## 2022-03-17 MED ORDER — FENTANYL CITRATE (PF) 100 MCG/2ML IJ SOLN: 25.0000 ug | INTRAMUSCULAR | Status: DC | PRN

## 2022-03-17 MED ORDER — OXYCODONE HCL 5 MG PO TABS: 5.0000 mg | ORAL_TABLET | Freq: Once | ORAL | Status: DC | PRN

## 2022-03-17 MED ORDER — LIDOCAINE-EPINEPHRINE (PF) 1 %-1:200000 IJ SOLN
INTRAMUSCULAR | Status: DC | PRN
  Administered 2022-03-17: 23 mL

## 2022-03-17 MED ORDER — LIDOCAINE-EPINEPHRINE (PF) 1 %-1:200000 IJ SOLN
INTRAMUSCULAR | Status: AC
  Filled 2022-03-17: qty 30

## 2022-03-17 MED ORDER — ACETAMINOPHEN 325 MG PO TABS: 650.0000 mg | ORAL_TABLET | Freq: Four times a day (QID) | ORAL | Status: DC | PRN

## 2022-03-17 MED ORDER — BUSPIRONE HCL 5 MG PO TABS
5.0000 mg | ORAL_TABLET | Freq: Two times a day (BID) | ORAL | Status: DC
  Administered 2022-03-17 (×2): 5 mg via ORAL
  Filled 2022-03-17 (×2): qty 1

## 2022-03-17 MED ORDER — DEXTROSE 5 % IV SOLN
INTRAVENOUS | Status: DC | PRN
  Administered 2022-03-17: 3 g via INTRAVENOUS

## 2022-03-17 MED ORDER — METOLAZONE 5 MG PO TABS: 2.5000 mg | ORAL_TABLET | ORAL | Status: DC

## 2022-03-17 MED ORDER — HEPARIN 6000 UNIT IRRIGATION SOLUTION
Status: DC | PRN
  Administered 2022-03-17: 1

## 2022-03-17 MED ORDER — MULTIVITAMIN WOMEN PO TABS: ORAL_TABLET | Freq: Every day | ORAL | Status: DC

## 2022-03-17 MED ORDER — CEFAZOLIN SODIUM 1 G IJ SOLR
INTRAMUSCULAR | Status: AC
Start: 2022-03-17 — End: ?
  Filled 2022-03-17: qty 30

## 2022-03-17 MED ORDER — ROPIVACAINE HCL 5 MG/ML IJ SOLN
INTRAMUSCULAR | Status: DC | PRN
  Administered 2022-03-17: 25 mL via PERINEURAL

## 2022-03-17 MED ORDER — POLYETHYLENE GLYCOL 3350 17 GM/SCOOP PO POWD
17.0000 g | Freq: Every day | ORAL | Status: DC
  Administered 2022-03-17: 17 g via ORAL
  Filled 2022-03-17: qty 255

## 2022-03-17 MED ORDER — COLCHICINE 0.6 MG PO TABS
0.3000 mg | ORAL_TABLET | Freq: Every day | ORAL | Status: DC
Start: 2022-03-17 — End: 2022-03-18
  Administered 2022-03-17 – 2022-03-18 (×2): 0.3 mg via ORAL
  Filled 2022-03-17 (×2): qty 0.5

## 2022-03-17 MED ORDER — MIDAZOLAM HCL 5 MG/5ML IJ SOLN
INTRAMUSCULAR | Status: DC | PRN
  Administered 2022-03-17: 2 mg via INTRAVENOUS

## 2022-03-17 MED ORDER — HEPARIN (PORCINE) 25000 UT/250ML-% IV SOLN
800.0000 [IU]/h | INTRAVENOUS | Status: DC
  Administered 2022-03-17: 900 [IU]/h via INTRAVENOUS
  Filled 2022-03-17: qty 250

## 2022-03-17 MED ORDER — NOVOLOG 100 UNIT/ML ~~LOC~~ SOLN: 2.0000 [IU] | Freq: Three times a day (TID) | SUBCUTANEOUS | Status: DC

## 2022-03-17 MED ORDER — DULOXETINE HCL 20 MG PO CPEP
20.0000 mg | ORAL_CAPSULE | Freq: Every day | ORAL | Status: DC
  Administered 2022-03-17 – 2022-03-18 (×2): 20 mg via ORAL
  Filled 2022-03-17 (×2): qty 1

## 2022-03-17 MED ORDER — MIDAZOLAM HCL 2 MG/2ML IJ SOLN
INTRAMUSCULAR | Status: AC
  Filled 2022-03-17: qty 2

## 2022-03-17 MED ORDER — PANTOPRAZOLE SODIUM 40 MG PO TBEC
40.0000 mg | DELAYED_RELEASE_TABLET | Freq: Every day | ORAL | Status: DC
  Administered 2022-03-18: 40 mg via ORAL
  Filled 2022-03-17: qty 1

## 2022-03-17 MED ORDER — HEPARIN 6000 UNIT IRRIGATION SOLUTION
Status: AC
  Filled 2022-03-17: qty 500

## 2022-03-17 MED ORDER — PROTAMINE SULFATE 10 MG/ML IV SOLN
INTRAVENOUS | Status: AC
  Filled 2022-03-17: qty 5

## 2022-03-17 MED ORDER — HEPARIN SODIUM (PORCINE) 1000 UNIT/ML IJ SOLN
INTRAMUSCULAR | Status: DC | PRN
  Administered 2022-03-17: 10000 [IU] via INTRAVENOUS

## 2022-03-17 MED ORDER — LIDOCAINE HCL (PF) 1 % IJ SOLN
INTRAMUSCULAR | Status: AC
  Filled 2022-03-17: qty 30

## 2022-03-17 SURGICAL SUPPLY — 37 items
ARMBAND PINK RESTRICT EXTREMIT (MISCELLANEOUS) ×6 IMPLANT
BAG COUNTER SPONGE SURGICOUNT (BAG) ×3 IMPLANT
CANISTER SUCT 3000ML PPV (MISCELLANEOUS) ×3 IMPLANT
CANNULA VESSEL 3MM 2 BLNT TIP (CANNULA) ×3 IMPLANT
CLIP TI WIDE RED SMALL 6 (CLIP) ×1 IMPLANT
CLIP VESOCCLUDE MED 6/CT (CLIP) ×3 IMPLANT
CLIP VESOCCLUDE SM WIDE 6/CT (CLIP) ×3 IMPLANT
COVER PROBE W GEL 5X96 (DRAPES) IMPLANT
DERMABOND ADVANCED (GAUZE/BANDAGES/DRESSINGS) ×2
DERMABOND ADVANCED .7 DNX12 (GAUZE/BANDAGES/DRESSINGS) ×2 IMPLANT
ELECT REM PT RETURN 9FT ADLT (ELECTROSURGICAL) ×3
ELECTRODE REM PT RTRN 9FT ADLT (ELECTROSURGICAL) ×2 IMPLANT
GLOVE BIO SURGEON STRL SZ7.5 (GLOVE) ×3 IMPLANT
GLOVE BIOGEL PI IND STRL 8 (GLOVE) ×2 IMPLANT
GLOVE BIOGEL PI INDICATOR 8 (GLOVE) ×1
GLOVE SURG POLY ORTHO LF SZ7.5 (GLOVE) IMPLANT
GLOVE SURG UNDER LTX SZ8 (GLOVE) ×3 IMPLANT
GOWN STRL REUS W/ TWL LRG LVL3 (GOWN DISPOSABLE) ×6 IMPLANT
GOWN STRL REUS W/TWL LRG LVL3 (GOWN DISPOSABLE) ×9
KIT BASIN OR (CUSTOM PROCEDURE TRAY) ×3 IMPLANT
KIT TURNOVER KIT B (KITS) ×3 IMPLANT
NS IRRIG 1000ML POUR BTL (IV SOLUTION) ×3 IMPLANT
PACK CV ACCESS (CUSTOM PROCEDURE TRAY) ×3 IMPLANT
PAD ARMBOARD 7.5X6 YLW CONV (MISCELLANEOUS) ×6 IMPLANT
SLING ARM FOAM STRAP LRG (SOFTGOODS) IMPLANT
SLING ARM FOAM STRAP MED (SOFTGOODS) IMPLANT
SPIKE FLUID TRANSFER (MISCELLANEOUS) ×3 IMPLANT
SPONGE SURGIFOAM ABS GEL 100 (HEMOSTASIS) IMPLANT
SUT MNCRL AB 4-0 PS2 18 (SUTURE) ×6 IMPLANT
SUT PROLENE 6 0 BV (SUTURE) ×4 IMPLANT
SUT SILK 2 0 PERMA HAND 18 BK (SUTURE) ×1 IMPLANT
SUT VIC AB 3-0 SH 27 (SUTURE) ×9
SUT VIC AB 3-0 SH 27X BRD (SUTURE) ×2 IMPLANT
TAPE UMBILICAL 1/8X30 (MISCELLANEOUS) ×1 IMPLANT
TOWEL GREEN STERILE (TOWEL DISPOSABLE) ×3 IMPLANT
UNDERPAD 30X36 HEAVY ABSORB (UNDERPADS AND DIAPERS) ×3 IMPLANT
WATER STERILE IRR 1000ML POUR (IV SOLUTION) ×3 IMPLANT

## 2022-03-17 NOTE — Progress Notes (Signed)
Orthopedic Tech Progress Note Patient Details:  [name] Sep 01, 1955 [id number]  Ortho Devices Type of Ortho Device: Arm sling Ortho Device/Splint Interventions: Ordered      Danton Sewer A Giordan Fordham 03/17/2022, 4:39 PM

## 2022-03-17 NOTE — Anesthesia Procedure Notes (Signed)
Anesthesia Regional Block: Supraclavicular block   Pre-Anesthetic Checklist: , timeout performed,  Correct Patient, Correct Site, Correct Laterality,  Correct Procedure, Correct Position, site marked,  Risks and benefits discussed,  Surgical consent,  Pre-op evaluation,  At surgeon's request and post-op pain management  Laterality: Left  Prep: chloraprep       Needles:  Injection technique: Single-shot  Needle Type: Echogenic Stimulator Needle     Needle Length: 5cm  Needle Gauge: 22     Additional Needles:   Procedures:, nerve stimulator,,,,,     Nerve Stimulator or Paresthesia:  Response: biceps flexion, 0.45 mA  Additional Responses:   Narrative:  Start time: 03/17/2022 7:04 AM End time: 03/17/2022 7:14 AM Injection made incrementally with aspirations every 5 mL.  Performed by: Personally  Anesthesiologist: Albertha Ghee, MD  Additional Notes: Functioning IV was confirmed and monitors were applied.  A 52m 22ga Arrow echogenic stimulator needle was used. Sterile prep and drape,hand hygiene and sterile gloves were used.  Negative aspiration and negative test dose prior to incremental administration of local anesthetic. The patient tolerated the procedure well.  Ultrasound guidance: relevent anatomy identified, needle position confirmed, local anesthetic spread visualized around nerve(s), vascular puncture avoided.  Image printed for medical record.

## 2022-03-17 NOTE — Interval H&P Note (Signed)
History and Physical Interval Note:  03/17/2022 7:19 AM  [name]  has presented today for surgery, with the diagnosis of CKD IV.  The various methods of treatment have been discussed with the patient and family. After consideration of risks, benefits and other options for treatment, the patient has consented to  Procedure(s): LEFT ARM ARTERIOVENOUS (AV) FISTULA VERSUS GRAFT CREATION (Left) as a surgical intervention.  The patient's history has been reviewed, patient examined, no change in status, stable for surgery.  I have reviewed the patient's chart and labs.  Questions were answered to the patient's satisfaction.     [name]

## 2022-03-17 NOTE — Op Note (Signed)
    NAME: [name]    MRN: [id number] DOB: [birth year]    DATE OF OPERATION: 03/17/2022  PREOP DIAGNOSIS:    End-stage renal disease  POSTOP DIAGNOSIS:    Same  PROCEDURE:    Left brachiocephalic fistula (superficialization by transposition of cephalic vein)  SURGEON: Judeth Cornfield. Scot Dock, MD  ASSIST: Arlee Muslim, PA  ANESTHESIA: Block with local also  EBL: Minimal  INDICATIONS:    [name] is a 66 y.o. female who presents for new access.  She had a reasonable sized cephalic vein on exam and by SonoSite however it was very deep.  I therefore elected to transpose the vein after it was fully dissected free from the antecubital level to the axilla.  FINDINGS:   Good thrill in the fistula at the completion of the procedure with a radial and ulnar signal with the Doppler.  TECHNIQUE:   The patient was taken to the operating room after a block was placed by anesthesia.  The left arm was prepped and draped in usual sterile fashion.  I had looked at the vein myself with the SonoSite and the vein was very deep.  It was also fairly far lateral as compared to the brachial artery.  A longitudinal incision was made over the brachial artery after 1% lidocaine with epinephrine was injected.  The artery was dissected free was about a 4 mm artery.  Using 3 additional incisions over the cephalic vein in the upper arm I dissected free the cephalic vein from the antecubital level up to the shoulder.  Branches were divided tween clips and 3-0 silk ties.  The vein was fully mobilized.  I then tunneled from the brachial artery incision to the most central incision and the patient was heparinized.  The vein was ligated distally and irrigated up with heparinized saline.  It was marked to prevent twisting.  I then brought the vein through the previously created tunnel for anastomosis to the brachial artery.  The artery was clamped proximally and distally and a longitudinal arteriotomy was made.   The vein was spatulated and sewn into side to the artery using continuous 6-0 Prolene suture.  At the completion there was a good thrill in the fistula and a radial and ulnar signal with the Doppler.  Hemostasis was obtained in the wounds.  Each of the wounds was closed with a deep layer of 3-0 Vicryl and the skin closed with 4-0 Monocryl.  Dermabond was applied.  The patient tolerated the procedure well was transferred to the recovery room in stable condition.  All needle and sponge counts were correct.  Given the complexity of the case,  the assistant was necessary in order to expedient the procedure and safely perform the technical aspects of the operation.  The assistant provided traction and countertraction to assist with exposure of the artery and vein.  They also assisted with suture ligation of multiple venous branches.  They played a critical role in the anastomosis. These skills, especially following the Prolene suture for the anastomosis, could not have been adequately performed by a scrub tech assistant.    [name], MD, FACS [facility]  DATE OF DICTATION:   03/17/2022

## 2022-03-17 NOTE — Progress Notes (Signed)
.  ANTICOAGULATION CONSULT NOTE - Follow Up Consult  Pharmacy Consult for Apixaban > heparin  Indication: atrial fibrillation  Allergies  Allergen Reactions   Bee Pollen Anaphylaxis and Other (See Comments)    UNCONFIRMED, but IS allergic to bee VENOM   Bee Venom Anaphylaxis   Sulfa Antibiotics Itching    Patient Measurements: Height: '5\' 7"'$  (170.2 cm) Weight: 129.2 kg (284 lb 13.4 oz) IBW/kg (Calculated) : 61.6   Vital Signs: Temp: 97.8 F (36.6 C) (07/28 1001) Temp Source: Oral (07/28 1001) BP: 104/49 (07/28 1001) Pulse Rate: 77 (07/28 0945)  Labs: Recent Labs    03/15/22 0200 03/16/22 0156 03/17/22 0138  HGB 8.9* 9.2* 9.1*  HCT 27.0* 28.3* 28.8*  PLT 125* 132* 129*  CREATININE 5.47* 3.90* 4.62*     Estimated Creatinine Clearance: 17 mL/min (A) (by C-G formula based on SCr of 4.62 mg/dL (H)).   Medical History: Past Medical History:  Diagnosis Date   Acute on chronic systolic CHF (congestive heart failure) (HCC) 12/21/2020   Allergic rhinitis    Arthritis    Asthma    Chronic diastolic CHF (congestive heart failure) (HCC)    COPD (chronic obstructive pulmonary disease) (HCC)    Depression    DM (diabetes mellitus) ([place])    DVT (deep vein thrombosis) in pregnancy    GERD (gastroesophageal reflux disease)    HTN (hypertension)    Hyperlipidemia    Obesity    Pneumonia 04/2018   RIGHT LOBE   Sleep apnea    compliant with CPAP     Assessment: 66 YO F with persistent afib previously on heparin. Patient on apixaban 5 mg PO BID PTA. CHA2DS2/VAS of 6. Pharmacy consulted to restart dose of apixaban.    Afib dosing criteria: wt > 60kg, age < 17, SCr >1.5. Based upon dosing criteria, 5 mg dose is appropriate.   Apixaban held for AVG placement  Per [facility] ok to start heparin drip this evening - monitor bleeding and resume apixaban 7/29 am if stable.   Goal of Therapy:  Monitor platelets by anticoagulation protocol: Yes   Plan:  Heparin infusion 900 uts/hr  start at 3pm Daily heparin level and cbc  Initiate apixaban 5 mg PO BID - tomorrow if pt stable    Bonnita Nasuti Pharm.D. CPP, BCPS Clinical Pharmacist [phone] 03/17/2022 11:08 AM    Gena Fray, PharmD PGY1 Pharmacy Resident   03/17/2022 11:06 AM

## 2022-03-17 NOTE — Progress Notes (Signed)
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 Now likely ESRD at this point. -ineffective diuresis with recurrent admissions for hypervolemia and now with uremic symptoms, Dr. Candiss Norse had a lengthy discussion in regards to renal replacement therapy, decision was made to start-TDC placed 7/25 followed by first HD  -second HD 7/26- has been [facility] at [facility]-  think it would be best to keep overnight given surgery today and then do 3rd treatment in the AM prior to discharge-  then she can start at OP unit on Tuesday     Ready for discharge once OP clinic IDd +/- AV access placement - thinking now will be Saturday   Acute on chronic combined CHF -Does make urine which will make her HD more manageable- no diuretics  Chest pain -Per cardiology and primary.   Paroxysmal Afib -On amio 200 mg daily, metoprolol 25 mg bid-  has been decreased to daily for low BP which I agree with  -Eliquis on hold d/t dialysis catheter placement today, was on heparin in the interim  Anemia of chronic disease -Transfuse for Hb<7 Hgb dropping slowly with events-  getting iron and ESA   Bones-  PTH 86-    phos is not bad-  no binder yet  HTN-  dont think will tolerate hydralazine with low BP  Subjective:    Now s/p AVF  Objective Vital signs in last 24 hours: Vitals:   03/17/22 0722 03/17/22 0930 03/17/22 0945 03/17/22 1001  BP:  (!) 107/48 (!) 102/51 (!) 104/49  Pulse: 81 77 77   Resp: '19 19 18 '$ (!) 22  Temp:  97.9 F (36.6 C) 97.9 F (36.6 C) 97.8 F (36.6 C)  TempSrc:    Oral  SpO2: 100% 99% 98% 95%  Weight:      Height:       Weight change: -7.4 kg  Intake/Output Summary (Last 24 hours) at 03/17/2022 1014 Last data filed at 03/17/2022 0924 Gross per 24 hour  Intake 1420 ml  Output 400 ml  Net 1020 ml    Labs: Basic Metabolic Panel: Recent Labs  Lab 03/13/22 0652 03/14/22 1004 03/15/22 0200 03/16/22 0156  03/17/22 0138  NA 138 137 137 137 137  K 3.7 3.4* 3.5 3.4* 3.4*  CL 104 104 105 103 103  CO2 '23 24 22 27 24  '$ GLUCOSE 98 115* 146* 106* 135*  BUN 67* 71* 58* 39* 47*  CREATININE 6.78* 6.70* 5.47* 3.90* 4.62*  CALCIUM 8.9 9.0 8.6* 8.9 9.0  PHOS 6.1* 5.6*  --   --   --    Liver Function Tests: Recent Labs  Lab 03/12/22 0758 03/13/22 0652 03/14/22 1004  AST 24  --   --   ALT 21  --   --   ALKPHOS 122  --   --   BILITOT 0.6  --   --   PROT 7.3  --   --   ALBUMIN 3.2* 3.1* 3.3*   No results for input(s): "LIPASE", "AMYLASE" in the last 168 hours. No results for input(s): "AMMONIA" in the last 168 hours. CBC: Recent Labs  Lab 03/14/22 0213 03/14/22 1004 03/15/22 0200 03/16/22 0156 03/17/22 0138  WBC 4.7 3.7* 5.3 5.8 6.5  HGB 9.2* 9.5* 8.9* 9.2* 9.1*  HCT 29.2* 29.6* 27.0* 28.3* 28.8*  MCV 83.2 83.6 82.8 82.5 84.2  PLT 152 146* 125* 132* 129*   Cardiac Enzymes: No results for input(s): "CKTOTAL", "  CKMB", "CKMBINDEX", "TROPONINI" in the last 168 hours. CBG: Recent Labs  Lab 03/16/22 0745 03/16/22 1130 03/16/22 1625 03/16/22 2156 03/17/22 0932  GLUCAP 131* 99 124* 135* 99    Iron Studies:  No results for input(s): "IRON", "TIBC", "TRANSFERRIN", "FERRITIN" in the last 72 hours.  Studies/Results: VAS [place] UPPER EXT VEIN MAPPING (PRE-OP AVF)  Result Date: 03/15/2022 UPPER EXTREMITY VEIN MAPPING Patient Name:  ALUEL SCHWARZ  Date of Exam:   03/15/2022 Medical Rec #: [id number]       Accession #:    [id number] Date of Birth: 02/16/1956       Patient Gender: F Patient Age:   3 years Exam Location:  [facility] Procedure:      VAS [place] UPPER EXT VEIN MAPPING (PRE-OP AVF) Referring Phys: Corliss Parish --------------------------------------------------------------------------------  Indications: Pre-access. Comparison Study: No prior studies. Performing Technologist: Oliver Hum RVT  Examination Guidelines: A complete evaluation includes B-mode imaging, spectral  Doppler, color Doppler, and power Doppler as needed of all accessible portions of each vessel. Bilateral testing is considered an integral part of a complete examination. Limited examinations for reoccurring indications may be performed as noted. +-----------------+-------------+----------+---------+ Right Cephalic   Diameter (cm)Depth (cm)Findings  +-----------------+-------------+----------+---------+ Shoulder             0.37        1.60             +-----------------+-------------+----------+---------+ Prox upper arm       0.30        1.60             +-----------------+-------------+----------+---------+ Mid upper arm        0.28        1.80   branching +-----------------+-------------+----------+---------+ Dist upper arm       0.20        1.30             +-----------------+-------------+----------+---------+ Antecubital fossa    0.33        0.38   branching +-----------------+-------------+----------+---------+ Prox forearm         0.23        0.77   branching +-----------------+-------------+----------+---------+ Mid forearm          0.16        0.83             +-----------------+-------------+----------+---------+ Dist forearm         0.18        0.51             +-----------------+-------------+----------+---------+ +-----------------+-------------+----------+---------+ Right Basilic    Diameter (cm)Depth (cm)Findings  +-----------------+-------------+----------+---------+ Shoulder             0.26        2.40             +-----------------+-------------+----------+---------+ Dist upper arm       0.32        2.10             +-----------------+-------------+----------+---------+ Antecubital fossa    0.27        1.90   branching +-----------------+-------------+----------+---------+ Prox forearm         0.08        0.85             +-----------------+-------------+----------+---------+ Mid forearm          0.04        0.37              +-----------------+-------------+----------+---------+ Distal forearm  0.05        0.20             +-----------------+-------------+----------+---------+ +-----------------+-------------+----------+---------+ Left Cephalic    Diameter (cm)Depth (cm)Findings  +-----------------+-------------+----------+---------+ Shoulder             0.31        1.40             +-----------------+-------------+----------+---------+ Prox upper arm       0.30        1.50   branching +-----------------+-------------+----------+---------+ Mid upper arm        0.26        1.50   branching +-----------------+-------------+----------+---------+ Dist upper arm       0.31        0.83             +-----------------+-------------+----------+---------+ Antecubital fossa    0.28        0.71   branching +-----------------+-------------+----------+---------+ Prox forearm         0.11        0.99             +-----------------+-------------+----------+---------+ Mid forearm          0.14        0.81             +-----------------+-------------+----------+---------+ Dist forearm         0.11        0.53             +-----------------+-------------+----------+---------+ +-----------------+-------------+----------+---------+ Left Basilic     Diameter (cm)Depth (cm)Findings  +-----------------+-------------+----------+---------+ Shoulder             0.27        2.60             +-----------------+-------------+----------+---------+ Mid upper arm        0.27        2.40             +-----------------+-------------+----------+---------+ Dist upper arm       0.28        2.00             +-----------------+-------------+----------+---------+ Antecubital fossa    0.29        2.00   branching +-----------------+-------------+----------+---------+ Prox forearm         0.16        0.91   branching +-----------------+-------------+----------+---------+ Mid forearm           0.08        0.72             +-----------------+-------------+----------+---------+ Distal forearm       0.04        0.44             +-----------------+-------------+----------+---------+ *See table(s) above for measurements and observations.  Diagnosing physician: Monica Martinez MD Electronically signed by Monica Martinez MD on 03/15/2022 at 6:25:27 PM.    Final     Medications: Infusions:  sodium chloride Stopped (03/14/22 1900)    ceFAZolin (ANCEF) IV      Scheduled Medications:  amiodarone  200 mg Oral Daily   amiodarone  200 mg Oral Daily   busPIRone  5 mg Oral BID   Chlorhexidine Gluconate Cloth  6 each Topical Q0600   colchicine  0.3 mg Oral Daily   cycloSPORINE  1 drop Both Eyes BID   darbepoetin (ARANESP) injection - NON-DIALYSIS  60 mcg Subcutaneous Q Mon-1800   Dulaglutide  1.5 mg Subcutaneous Weekly  DULoxetine  20 mg Oral Daily   famotidine  20 mg Oral Daily   fluticasone  2 spray Each Nare Daily   insulin aspart  0-5 Units Subcutaneous QHS   insulin aspart  0-6 Units Subcutaneous TID WC   isosorbide mononitrate  15 mg Oral Daily   linaclotide  72 mcg Oral q morning   loratadine  10 mg Oral Daily   metolazone  2.5 mg Oral Weekly   metoprolol succinate  25 mg Oral BID   metoprolol succinate  25 mg Oral Daily   Multivitamin Women   Oral Daily   NovoLOG  2-10 Units Subcutaneous TID AC   [START ON 03/18/2022] pantoprazole  40 mg Oral QAC breakfast   polyethylene glycol powder  17 g Oral Daily   potassium chloride SA  60 mEq Oral Daily   rosuvastatin  10 mg Oral Daily   rosuvastatin  10 mg Oral QPM   senna-docusate  1 tablet Oral BID   torsemide  60 mg Oral q AM    have reviewed scheduled and prn medications.  Physical Exam: General: Resting comfortably, no acute distress Heart: Regular rate, rhythm. No murmurs. Lungs: Normal respiratory effort. Clear to auscultation bilaterally. Abdomen: Soft, non-tender, obese abdomen Extremities: No pitting edema  in lower extremities Neuro: Awake, alert, conversing appropriately. No focal deficits. Dialysis Access: right IJ tunneled HD cath  and new LUE AVF-  good bruit   [place] Downs A Cleo Santucci  03/17/2022,10:14 AM  LOS: 7 days

## 2022-03-17 NOTE — Anesthesia Procedure Notes (Signed)
Procedure Name: MAC Date/Time: 03/17/2022 7:40 AM  Performed by: Moshe Salisbury, CRNAPre-anesthesia Checklist: Patient identified, Emergency Drugs available, Suction available and Patient being monitored Patient Re-evaluated:Patient Re-evaluated prior to induction Oxygen Delivery Method: Nasal cannula Placement Confirmation: positive ETCO2 Dental Injury: Teeth and Oropharynx as per pre-operative assessment

## 2022-03-17 NOTE — Progress Notes (Signed)
Mobility Specialist Progress Note:   03/17/22 1406  Therapy Vitals  Pulse Rate 77  BP (!) 91/39  Oxygen Therapy  SpO2 96 %  O2 Device Room Air  Mobility  Activity Contraindicated/medical hold  $Mobility charge 1 Mobility   Pt received resting in bed. No complaints of pain. Held ambulating pt d/t low BP.   [facility] Jaisean Monteforte Mobility Specialist

## 2022-03-17 NOTE — Progress Notes (Addendum)
Case discussed with nephrologist. Plan is for pt to start at clinic on Tuesday, Aug 1. Contacted clinic to make them aware of pt's start date on Tuesday. Clinic requesting to complete paperwork with pt on Monday if possible between 9:00-2:00. Discussed this option with pt yesterday. Added to pt's AVS as well. Contacted renal NP with request that orders be sent to clinic if pt d/c this weekend. Will assist as needed.   Melven Sartorius Renal Navigator [phone]  Addendum at 2:53 pm: Spoke to pt via phone to advise her that clinic states that if she can complete paperwork on Monday to please come between 9:00-2:00. Pt voices understanding.

## 2022-03-17 NOTE — Progress Notes (Signed)
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  Admitted with heart failure exacerbation and atypical chest pain.  Followed by cardiology.  Remains in the hospital due to persistent symptoms. She developed significant renal dysfunction and oliguria, started on dialysis. Fluid balance improved after initiating dialysis. AV fistula creation today 7/28. Dialysis tomorrow 7/29 and discharge, next dialysis TTS schedule, schedule already established.   Assessment & Plan:   Acute on chronic systolic and diastolic congestive heart failure: Fluid overload.  Failed diuretic challenge.  Now on hemodialysis.  Improving.  Obstructive sleep apnea: Currently untreated.  Will need a new study and device.  Chest pain: Atypical.  Persistent epigastric and retrosternal pain.  EKG nonischemic.  Troponins nonischemic.  Prior cardiac catheterization with minimal coronary artery disease.  Improved now.  Acute kidney injury on chronic kidney disease stage IV: Now ESRD.  Worsening renal functions.   Right IJ permacath and hemodialysis.  Now on TTS schedule. AV fistula left forearm today. Outpatient dialysis fixed, next dialysis will be Tuesday after discharge.  Paroxysmal A-fib: Currently in sinus rhythm.  On amiodarone.  Therapeutic on Eliquis.  Remains on heparin bridging.  Can go on Eliquis tomorrow.  Valvular heart disease, mitral valve repair tricuspid valve repair with residual severe tricuspid regurgitation: Probably exacerbating fluid imbalance.  Seen by structural heart team, advised she is not able to have  surgery.  Hypokalemia: Replaced.  Adequate.  DVT prophylaxis:   Heparin infusion.   Code Status: Full code Family Communication: None Disposition Plan: Status is: Inpatient.  Inpatient procedure planned today.  For discharge tomorrow.  Consultants:  Cardiology Nephrology  vascular  Procedures:  Permacath Left upper extremity AV fistula  Antimicrobials:  None   Subjective:  Patient seen and examined.  Came back from procedure.  She has some numbness on the fingers which is anticipated. She was so happy she could walk around in the hallway and not getting any shortness of breath.  Does not have any epigastric or chest pain now but she has significant pain from permacath insertion site.  Used few doses of fentanyl last night.  Currently improved.  Objective: Vitals:   03/17/22 0722 03/17/22 0930 03/17/22 0945 03/17/22 1001  BP:  (!) 107/48 (!) 102/51 (!) 104/49  Pulse: 81 77 77   Resp: '19 19 18 '$ (!) 22  Temp:  97.9 F (36.6 C) 97.9 F (36.6 C) 97.8 F (36.6 C)  TempSrc:    Oral  SpO2: 100% 99% 98% 95%  Weight:      Height:        Intake/Output Summary (Last 24 hours) at 03/17/2022 1351 Last data filed at 03/17/2022 1100 Gross per 24 hour  Intake 1060 ml  Output 1450 ml  Net -390 ml   Filed Weights   03/15/22 1327 03/16/22 0624 03/17/22 0526  Weight: (!) 136.3 kg 127.7 kg 129.2 kg    Examination:  General exam: Comfortable on room air.  Pleasant. Right IJ tunnel catheter looks clean and dry. Respiratory system: Bilateral clear. Cardiovascular system: S1 & S2 heard, RRR.   Gastrointestinal system: Soft.  Nontender.  No rigidity or guarding. Central nervous system: Alert and oriented. No  focal neurological deficits. Extremities: Symmetric 5 x 5 power. Left brachial area with immediate postop surgical scar, mild swelling.  Distal vascularity intact.    Data Reviewed: I have personally reviewed following labs and imaging studies  CBC: Recent Labs  Lab  03/14/22 0213 03/14/22 1004 03/15/22 0200 03/16/22 0156 03/17/22 0138  WBC 4.7 3.7* 5.3 5.8 6.5  HGB 9.2* 9.5* 8.9* 9.2* 9.1*  HCT 29.2* 29.6* 27.0* 28.3* 28.8*  MCV 83.2 83.6 82.8 82.5 84.2  PLT 152 146* 125* 132* 191*   Basic Metabolic Panel: Recent Labs  Lab 03/11/22 0158 03/12/22 0758 03/13/22 0652 03/14/22 1004 03/15/22 0200 03/16/22 0156 03/17/22 0138  NA 137 139 138 137 137 137 137  K 3.1* 3.9 3.7 3.4* 3.5 3.4* 3.4*  CL 101 104 104 104 105 103 103  CO2 '27 26 23 24 22 27 24  '$ GLUCOSE 100* 109* 98 115* 146* 106* 135*  BUN 43* 58* 67* 71* 58* 39* 47*  CREATININE 4.77* 6.41* 6.78* 6.70* 5.47* 3.90* 4.62*  CALCIUM 8.8* 9.1 8.9 9.0 8.6* 8.9  8.8 9.0  MG 1.8 2.0  --   --   --   --   --   PHOS  --   --  6.1* 5.6*  --   --   --    GFR: Estimated Creatinine Clearance: 17 mL/min (A) (by C-G formula based on SCr of 4.62 mg/dL (H)). Liver Function Tests: Recent Labs  Lab 03/12/22 0758 03/13/22 0652 03/14/22 1004  AST 24  --   --   ALT 21  --   --   ALKPHOS 122  --   --   BILITOT 0.6  --   --   PROT 7.3  --   --   ALBUMIN 3.2* 3.1* 3.3*   No results for input(s): "LIPASE", "AMYLASE" in the last 168 hours. No results for input(s): "AMMONIA" in the last 168 hours. Coagulation Profile: No results for input(s): "INR", "PROTIME" in the last 168 hours. Cardiac Enzymes: No results for input(s): "CKTOTAL", "CKMB", "CKMBINDEX", "TROPONINI" in the last 168 hours. BNP (last 3 results) No results for input(s): "PROBNP" in the last 8760 hours. HbA1C: No results for input(s): "HGBA1C" in the last 72 hours. CBG: Recent Labs  Lab 03/16/22 1130 03/16/22 1625 03/16/22 2156 03/17/22 0932 03/17/22 1206  GLUCAP 99 124* 135* 99 194*   Lipid Profile: No results for input(s): "CHOL", "HDL", "LDLCALC", "TRIG", "CHOLHDL", "LDLDIRECT" in the last 72 hours. Thyroid Function Tests: No results for input(s): "TSH", "T4TOTAL", "FREET4", "T3FREE", "THYROIDAB" in the last 72  hours. Anemia Panel: No results for input(s): "VITAMINB12", "FOLATE", "FERRITIN", "TIBC", "IRON", "RETICCTPCT" in the last 72 hours.  Sepsis Labs: No results for input(s): "PROCALCITON", "LATICACIDVEN" in the last 168 hours.  Recent Results (from the past 240 hour(s))  SARS Coronavirus 2 by RT PCR (hospital order, performed in [facility] hospital lab) *cepheid single result test* Anterior Nasal Swab     Status: None   Collection Time: 03/08/22  3:55 PM   Specimen: Anterior Nasal Swab  Result Value Ref Range Status   SARS Coronavirus 2 by RT PCR NEGATIVE NEGATIVE Final    Comment: (NOTE) SARS-CoV-2 target nucleic acids are NOT DETECTED.  The SARS-CoV-2 RNA is generally detectable in upper and lower respiratory specimens during the acute phase of infection. The lowest concentration of SARS-CoV-2 viral copies this assay can detect is 250 copies / mL. A negative result does not preclude SARS-CoV-2 infection and should not be used as  the sole basis for treatment or other patient management decisions.  A negative result may occur with improper specimen collection / handling, submission of specimen other than nasopharyngeal swab, presence of viral mutation(s) within the areas targeted by this assay, and inadequate number of viral copies (<250 copies / mL). A negative result must be combined with clinical observations, patient history, and epidemiological information.  Fact Sheet for Patients:   https://www.patel.info/  Fact Sheet for Healthcare Providers: https://hall.com/  This test is not yet approved or  cleared by the [place] FDA and has been authorized for detection and/or diagnosis of SARS-CoV-2 by FDA under an Emergency Use Authorization (EUA).  This EUA will remain in effect (meaning this test can be used) for the duration of the COVID-19 declaration under Section 564(b)(1) of the Act, 21 U.S.C. section 360bbb-3(b)(1), unless  the authorization is terminated or revoked sooner.  Performed at [facility], [address], [place], [place] [address]   Surgical PCR screen     Status: None   Collection Time: 03/12/22  9:20 PM   Specimen: Nasal Mucosa; Nasal Swab  Result Value Ref Range Status   MRSA, PCR NEGATIVE NEGATIVE Final   Staphylococcus aureus NEGATIVE NEGATIVE Final    Comment: (NOTE) The Xpert SA Assay (FDA approved for NASAL specimens in patients 36 years of age and older), is one component of a comprehensive surveillance program. It is not intended to diagnose infection nor to guide or monitor treatment. Performed at [facility], [place] [address], [place], [place] [address]   Surgical pcr screen     Status: None   Collection Time: 03/16/22  7:45 PM   Specimen: Nasal Mucosa; Nasal Swab  Result Value Ref Range Status   MRSA, PCR NEGATIVE NEGATIVE Final   Staphylococcus aureus NEGATIVE NEGATIVE Final    Comment: (NOTE) The Xpert SA Assay (FDA approved for NASAL specimens in patients 22 years of age and older), is one component of a comprehensive surveillance program. It is not intended to diagnose infection nor to guide or monitor treatment. Performed at [facility], [place] [address] [place], [place] [address]          Radiology Studies: VAS [place] UPPER EXT VEIN MAPPING (PRE-OP AVF)  Result Date: 03/15/2022 UPPER EXTREMITY VEIN MAPPING Patient Name:  KAMEREN BAADE  Date of Exam:   03/15/2022 Medical Rec #: [id number]       Accession #:    [id number] Date of Birth: 08-10-1956       Patient Gender: F Patient Age:   22 years Exam Location:  [facility] Procedure:      VAS [place] UPPER EXT VEIN MAPPING (PRE-OP AVF) Referring Phys: Corliss Parish --------------------------------------------------------------------------------  Indications: Pre-access. Comparison Study: No prior studies. Performing Technologist: Oliver Hum RVT  Examination Guidelines: A  complete evaluation includes B-mode imaging, spectral Doppler, color Doppler, and power Doppler as needed of all accessible portions of each vessel. Bilateral testing is considered an integral part of a complete examination. Limited examinations for reoccurring indications may be performed as noted. +-----------------+-------------+----------+---------+ Right Cephalic   Diameter (cm)Depth (cm)Findings  +-----------------+-------------+----------+---------+ Shoulder             0.37        1.60             +-----------------+-------------+----------+---------+ Prox upper arm       0.30        1.60             +-----------------+-------------+----------+---------+  Mid upper arm        0.28        1.80   branching +-----------------+-------------+----------+---------+ Dist upper arm       0.20        1.30             +-----------------+-------------+----------+---------+ Antecubital fossa    0.33        0.38   branching +-----------------+-------------+----------+---------+ Prox forearm         0.23        0.77   branching +-----------------+-------------+----------+---------+ Mid forearm          0.16        0.83             +-----------------+-------------+----------+---------+ Dist forearm         0.18        0.51             +-----------------+-------------+----------+---------+ +-----------------+-------------+----------+---------+ Right Basilic    Diameter (cm)Depth (cm)Findings  +-----------------+-------------+----------+---------+ Shoulder             0.26        2.40             +-----------------+-------------+----------+---------+ Dist upper arm       0.32        2.10             +-----------------+-------------+----------+---------+ Antecubital fossa    0.27        1.90   branching +-----------------+-------------+----------+---------+ Prox forearm         0.08        0.85              +-----------------+-------------+----------+---------+ Mid forearm          0.04        0.37             +-----------------+-------------+----------+---------+ Distal forearm       0.05        0.20             +-----------------+-------------+----------+---------+ +-----------------+-------------+----------+---------+ Left Cephalic    Diameter (cm)Depth (cm)Findings  +-----------------+-------------+----------+---------+ Shoulder             0.31        1.40             +-----------------+-------------+----------+---------+ Prox upper arm       0.30        1.50   branching +-----------------+-------------+----------+---------+ Mid upper arm        0.26        1.50   branching +-----------------+-------------+----------+---------+ Dist upper arm       0.31        0.83             +-----------------+-------------+----------+---------+ Antecubital fossa    0.28        0.71   branching +-----------------+-------------+----------+---------+ Prox forearm         0.11        0.99             +-----------------+-------------+----------+---------+ Mid forearm          0.14        0.81             +-----------------+-------------+----------+---------+ Dist forearm         0.11        0.53             +-----------------+-------------+----------+---------+ +-----------------+-------------+----------+---------+ Left Basilic     Diameter (cm)Depth (cm)Findings  +-----------------+-------------+----------+---------+  Shoulder             0.27        2.60             +-----------------+-------------+----------+---------+ Mid upper arm        0.27        2.40             +-----------------+-------------+----------+---------+ Dist upper arm       0.28        2.00             +-----------------+-------------+----------+---------+ Antecubital fossa    0.29        2.00   branching +-----------------+-------------+----------+---------+ Prox forearm          0.16        0.91   branching +-----------------+-------------+----------+---------+ Mid forearm          0.08        0.72             +-----------------+-------------+----------+---------+ Distal forearm       0.04        0.44             +-----------------+-------------+----------+---------+ *See table(s) above for measurements and observations.  Diagnosing physician: Monica Martinez MD Electronically signed by Monica Martinez MD on 03/15/2022 at 6:25:27 PM.    Final         Scheduled Meds:  amiodarone  200 mg Oral Daily   busPIRone  5 mg Oral BID   [START ON 03/18/2022] Chlorhexidine Gluconate Cloth  6 each Topical Q0600   colchicine  0.3 mg Oral Daily   cycloSPORINE  1 drop Both Eyes BID   darbepoetin (ARANESP) injection - NON-DIALYSIS  60 mcg Subcutaneous Q Mon-1800   DULoxetine  20 mg Oral Daily   famotidine  20 mg Oral Daily   fluticasone  2 spray Each Nare Daily   insulin aspart  0-5 Units Subcutaneous QHS   insulin aspart  0-6 Units Subcutaneous TID WC   isosorbide mononitrate  15 mg Oral Daily   linaclotide  72 mcg Oral q morning   loratadine  10 mg Oral Daily   metoprolol succinate  25 mg Oral Daily   [START ON 03/18/2022] pantoprazole  40 mg Oral QAC breakfast   polyethylene glycol powder  17 g Oral Daily   rosuvastatin  10 mg Oral Daily   senna-docusate  1 tablet Oral BID   torsemide  60 mg Oral q AM   Continuous Infusions:  sodium chloride Stopped (03/14/22 1900)    ceFAZolin (ANCEF) IV     heparin       LOS: 7 days    Time spent: 25 minutes    Barb Merino, MD [facility] Pager [phone]

## 2022-03-17 NOTE — Anesthesia Preprocedure Evaluation (Signed)
Anesthesia Evaluation  Patient identified by MRN, date of birth, ID band Patient awake    Reviewed: Allergy & Precautions, H&P , NPO status , Patient's Chart, lab work & pertinent test results  Airway Mallampati: II   Neck ROM: full    Dental   Pulmonary asthma , sleep apnea , COPD,    breath sounds clear to auscultation       Cardiovascular hypertension, +CHF   Rhythm:regular Rate:Normal     Neuro/Psych PSYCHIATRIC DISORDERS Anxiety Depression    GI/Hepatic GERD  ,  Endo/Other  diabetes, Type 2Morbid obesity  Renal/GU ESRFRenal disease     Musculoskeletal  (+) Arthritis ,   Abdominal   Peds  Hematology  (+) Blood dyscrasia, anemia ,   Anesthesia Other Findings   Reproductive/Obstetrics                             Anesthesia Physical Anesthesia Plan  ASA: 3  Anesthesia Plan: MAC and Regional   Post-op Pain Management:    Induction: Intravenous  PONV Risk Score and Plan: 2 and Propofol infusion, Ondansetron and Treatment may vary due to age or medical condition  Airway Management Planned: Simple Face Mask  Additional Equipment:   Intra-op Plan:   Post-operative Plan:   Informed Consent: I have reviewed the patients History and Physical, chart, labs and discussed the procedure including the risks, benefits and alternatives for the proposed anesthesia with the patient or authorized representative who has indicated his/her understanding and acceptance.     Dental advisory given  Plan Discussed with: CRNA, Anesthesiologist and Surgeon  Anesthesia Plan Comments:         Anesthesia Quick Evaluation

## 2022-03-17 NOTE — Transfer of Care (Signed)
Immediate Anesthesia Transfer of Care Note  Patient: [name]  Procedure(s) Performed: LEFT ARM ARTERIOVENOUS (AV) FISTULA CREATION (Left: Arm Upper) FISTULA SUPERFICIALIZATION -LEFT (Left)  Patient Location: PACU  Anesthesia Type:MAC combined with regional for post-op pain  Level of Consciousness: drowsy and patient cooperative  Airway & Oxygen Therapy: Patient Spontanous Breathing and Patient connected to nasal cannula oxygen  Post-op Assessment: Report given to RN, Post -op Vital signs reviewed and stable and Patient moving all extremities  Post vital signs: Reviewed and stable  Last Vitals:  Vitals Value Taken Time  BP    Temp    Pulse    Resp    SpO2      Last Pain:  Vitals:   03/17/22 0526  TempSrc: Oral  PainSc:       Patients Stated Pain Goal: 0 (24/09/73 5329)  Complications: No notable events documented.

## 2022-03-18 ENCOUNTER — Other Ambulatory Visit (INDEPENDENT_AMBULATORY_CARE_PROVIDER_SITE_OTHER): Payer: Self-pay | Admitting: Primary Care

## 2022-03-18 DIAGNOSIS — N179 Acute kidney failure, unspecified: Secondary | ICD-10-CM | POA: Diagnosis not present

## 2022-03-18 DIAGNOSIS — N189 Chronic kidney disease, unspecified: Secondary | ICD-10-CM

## 2022-03-18 DIAGNOSIS — E66813 Obesity, class 3: Secondary | ICD-10-CM | POA: Diagnosis present

## 2022-03-18 DIAGNOSIS — E1169 Type 2 diabetes mellitus with other specified complication: Secondary | ICD-10-CM

## 2022-03-18 LAB — BASIC METABOLIC PANEL
Anion gap: 9 (ref 5–15)
BUN: 48 mg/dL — ABNORMAL HIGH (ref 8–23)
CO2: 24 mmol/L (ref 22–32)
Calcium: 9 mg/dL (ref 8.9–10.3)
Chloride: 106 mmol/L (ref 98–111)
Creatinine, Ser: 4.62 mg/dL — ABNORMAL HIGH (ref 0.44–1.00)
GFR, Estimated: 10 mL/min — ABNORMAL LOW (ref >60)
Glucose, Bld: 138 mg/dL — ABNORMAL HIGH (ref 70–99)
Potassium: 3.9 mmol/L (ref 3.5–5.1)
Sodium: 139 mmol/L (ref 135–145)

## 2022-03-18 LAB — CBC
HCT: 28.7 % — ABNORMAL LOW (ref 36.0–46.0)
Hemoglobin: 8.9 g/dL — ABNORMAL LOW (ref 12.0–15.0)
MCH: 26.6 pg (ref 26.0–34.0)
MCHC: 31 g/dL (ref 30.0–36.0)
MCV: 85.7 fL (ref 80.0–100.0)
Platelets: 134 10*3/uL — ABNORMAL LOW (ref 150–400)
RBC: 3.35 MIL/uL — ABNORMAL LOW (ref 3.87–5.11)
RDW: 19.9 % — ABNORMAL HIGH (ref 11.5–15.5)
WBC: 6.8 10*3/uL (ref 4.0–10.5)
nRBC: 0 % (ref 0.0–0.2)

## 2022-03-18 LAB — RENAL FUNCTION PANEL
Albumin: 3 g/dL — ABNORMAL LOW (ref 3.5–5.0)
Anion gap: 7 (ref 5–15)
BUN: 50 mg/dL — ABNORMAL HIGH (ref 8–23)
CO2: 26 mmol/L (ref 22–32)
Calcium: 8.9 mg/dL (ref 8.9–10.3)
Chloride: 107 mmol/L (ref 98–111)
Creatinine, Ser: 4.43 mg/dL — ABNORMAL HIGH (ref 0.44–1.00)
GFR, Estimated: 10 mL/min — ABNORMAL LOW (ref >60)
Glucose, Bld: 141 mg/dL — ABNORMAL HIGH (ref 70–99)
Phosphorus: 4.2 mg/dL (ref 2.5–4.6)
Potassium: 3.8 mmol/L (ref 3.5–5.1)
Sodium: 140 mmol/L (ref 135–145)

## 2022-03-18 LAB — HEPARIN LEVEL (UNFRACTIONATED): Heparin Unfractionated: 0.73 IU/mL — ABNORMAL HIGH (ref 0.30–0.70)

## 2022-03-18 MED ORDER — HEPARIN SODIUM (PORCINE) 1000 UNIT/ML IJ SOLN
INTRAMUSCULAR | Status: AC
  Administered 2022-03-18: 1000 [IU]
  Filled 2022-03-18: qty 4

## 2022-03-18 MED ORDER — METOPROLOL SUCCINATE ER 25 MG PO TB24
25.0000 mg | ORAL_TABLET | Freq: Every day | ORAL | 0 refills | Status: DC
Start: 2022-03-18 — End: 2022-09-12

## 2022-03-18 MED ORDER — ISOSORBIDE MONONITRATE ER 30 MG PO TB24: 15.0000 mg | ORAL_TABLET | Freq: Every day | ORAL | 0 refills | Status: DC

## 2022-03-18 MED ORDER — APIXABAN 5 MG PO TABS
5.0000 mg | ORAL_TABLET | Freq: Two times a day (BID) | ORAL | Status: DC
  Administered 2022-03-18: 5 mg via ORAL
  Filled 2022-03-18: qty 1

## 2022-03-18 MED ORDER — ISOSORBIDE MONONITRATE ER 30 MG PO TB24
15.0000 mg | ORAL_TABLET | Freq: Every day | ORAL | 0 refills | Status: DC
  Filled 2022-03-18: qty 15, 30d supply, fill #0

## 2022-03-18 MED ORDER — AMIODARONE HCL 200 MG PO TABS
200.0000 mg | ORAL_TABLET | Freq: Every day | ORAL | 1 refills | Status: DC
  Filled 2022-03-18: qty 90, 90d supply, fill #0

## 2022-03-18 MED ORDER — METOPROLOL SUCCINATE ER 25 MG PO TB24
25.0000 mg | ORAL_TABLET | Freq: Every day | ORAL | 0 refills | Status: DC
  Filled 2022-03-18: qty 30, 30d supply, fill #0

## 2022-03-18 MED ORDER — AMIODARONE HCL 200 MG PO TABS: 200.0000 mg | ORAL_TABLET | Freq: Every day | ORAL | 0 refills | Status: DC

## 2022-03-18 NOTE — Discharge Summary (Addendum)
Physician Discharge Summary   Patient: [name] MRN: [id number] DOB: 07-08-1956  Admit date:     03/08/2022  Discharge date: 03/18/22  Discharge Physician: Jimmy Picket Wanisha Shiroma   PCP: [name], NP   Recommendations at discharge:    Patient will continue anticoagulation with apixaban Follow up as outpatient with vascular surgery Continue renal replacement therapy on Tues,Thurs, and Saturday.   Discharge Diagnoses: Principal Problem:   Acute kidney injury superimposed on chronic kidney disease ([place]) Active Problems:   Acute on chronic diastolic CHF (congestive heart failure) (HCC)   AF (paroxysmal atrial fibrillation) (HCC)   Diabetes mellitus type 2 in obese (HCC)   COPD (chronic obstructive pulmonary disease) ([place])   Class 3 obesity ([place])  Resolved Problems:   * No resolved hospital problems. [facility] Course: [name] was admitted to the hospital with the working diagnosis of decompensated heart failure.   66 yo female with the past medical history of heart failure, sp mitral and tricuspid valve replacement, paroxysmal atrial fibrillation, sp Maze procedure in 2022, T2DM, CKD stage IV, COPD, obesity, dyslipidemia and hypertension who presented with chest pain. Reported 3 days of persistent chest pain, 8/10 in intensity, non exertional, radiating to left arm and back, associated with dizzines and nausea. Positive weight gain 8 lbs over 2 weeks, lower extremity edema and orthopnea. On her initial physical examination her blood pressure was 138/92, HR 71, RR 17 and 02 saturation 98%, lungs with bilateral rales, heart with S1 and S2 present and rhythmic, abdomen not distended and positive lower extremity edema.   Na 1443, K 3,2 Cl 104, bicarbonate 29, glucose 84, bun 37 cr 2,46  BNP 205  High sensitive troponin 5  Wbc 4,5 hgb 10.7 plt 184  Sars covid 19 negative   Chest radiograph with cardiomegaly with no infiltrates and no effusions  CT chest with faint  bilateral ground glass opacities. No evidence of aneurysm or dissection in the thoracoabdominal aorta. 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   EKG 76 bpm, normal axis, qtc 544, sinus rhythm, with no significant ST segment or T wave changes.   Patient was placed on diuretic therapy for volume overload.  She had worsening renal function, by 07/23 her serum cr reached 6.41 despite holding diuretic therapy.  Positive uremic symptoms with persistent volume overload.  07/25 had right IJ tunneled HD cathter placed and underwent first hemodialysis treatment.  07/28 vascular surgery performed left brachiocephalic fistula creation.   Plan will continue renal replacement therapy on Tuesday, Thursday and Saturday schedule.    Assessment and Plan: * Acute kidney injury superimposed on chronic kidney disease (HCC) Hypokalemia.   Patient was admitted to the hospital with volume overload did not respond to diuresis and developed worsening renal function, now ESRD. Renal replacement therapy has been started with good toleration.  Patient will continue hemodialysis as out patient.   Discharge K is 3,8 and serum bicarbonate at 26.   Anemia of chronic kidney disease, present on admission.  Received IV iron and ESA Follow up as outpatient, her discharge hgb is 8.9  Thrombocytopenia with plt 409   Metabolic bone disease with PTH 86, P with in range. Follow up as outpatient.   Acute on chronic diastolic CHF (congestive heart failure) ([place]) TEE 01/2022 Echocardiogram with preserved LV systolic function with EF 81%, RV systolic function severely reduced, right ventricle cavity with severe enlargement. Bilateral atrial enlargement. Severe tricuspid valve regurgitation. Follow up limited echocardiogram with improvement in  TR (possible volume related)  Acute on chronic core pulmonale Pulmonary hypertension.   Patient initially placed on aggressive diuresis for volume overload, furosemide and  metolazone. Unfortunately did not respond and developed worsening renal failure.  Patient will continue volume control through ultrafiltration during hemodialysis.   Continue isosorbide and metoprolol Systemic Hypertension: Holding hydralazine to prevent hypotension.       AF (paroxysmal atrial fibrillation) ([place]) Patient will continue rate and rhythm control with amiodarone. Continue anticoagulation with apixaban.   Follow up EKG had persistent qtc prolongation 527. 1st degree AV block with positive PVC.   Diabetes mellitus type 2 in obese [facility]) Patient was placed on insulin sliding scale for glucose cover and monitoring.  Glucose remained controlled for inpatient criteria, 135 to 161 capillary glucose.   COPD (chronic obstructive pulmonary disease) (HCC) No acute exacerbation   Class 3 obesity (HCC) Calculated BMI is 48.5          Consultants: cardiology, nephrology, IR and vascular surgery  Procedures performed: right IJ tunneled HD cathter and left upper extremity fistula creation   Disposition: Home Diet recommendation:  Cardiac diet DISCHARGE MEDICATION: Allergies as of 03/18/2022       Reactions   Bee Pollen Anaphylaxis, Other (See Comments)   UNCONFIRMED, but IS allergic to bee VENOM   Bee Venom Anaphylaxis   Sulfa Antibiotics Itching        Medication List     STOP taking these medications    metolazone 2.5 MG tablet Commonly known as: ZAROXOLYN   potassium chloride SA 20 MEQ tablet Commonly known as: KLOR-CON M   torsemide 20 MG tablet Commonly known as: DEMADEX       TAKE these medications    acetaminophen 325 MG tablet Commonly known as: TYLENOL Take 2 tablets (650 mg total) by mouth every 6 (six) hours as needed for mild pain or moderate pain.   albuterol 108 (90 Base) MCG/ACT inhaler Commonly known as: VENTOLIN HFA INHALE 1-2 PUFFS BY MOUTH EVERY 6 HOURS AS NEEDED FOR WHEEZE OR SHORTNESS OF BREATH What changed:  how much to  take how to take this when to take this reasons to take this additional instructions   amiodarone 200 MG tablet Commonly known as: PACERONE Take 1 tablet (200 mg total) by mouth daily. What changed: when to take this   apixaban 5 MG Tabs tablet Commonly known as: Eliquis TAKE ONE TABLET BY MOUTH TWICE DAILY at [place] What changed:  how much to take how to take this when to take this additional instructions   busPIRone 5 MG tablet Commonly known as: BUSPAR TAKE 1 TABLET BY MOUTH TWICE A DAY   colchicine 0.6 MG tablet Take 0.5 tablets (0.3 mg total) by mouth daily.   diphenhydrAMINE 25 mg capsule Commonly known as: BENADRYL Take 25 mg by mouth every 6 (six) hours as needed for allergies or itching.   DULoxetine 60 MG capsule Commonly known as: CYMBALTA TAKE 1 CAPSULE BY MOUTH EVERY DAY What changed: how much to take   EPINEPHrine 0.3 mg/0.3 mL Soaj injection Commonly known as: EPI-PEN Inject 0.3 mg into the muscle as needed for anaphylaxis.   fluticasone 50 MCG/ACT nasal spray Commonly known as: FLONASE Place 2 sprays into both nostrils daily. What changed:  when to take this reasons to take this   isosorbide mononitrate 30 MG 24 hr tablet Commonly known as: IMDUR Take 0.5 tablets (15 mg total) by mouth daily.   lidocaine 5 % ointment Commonly  known as: XYLOCAINE Apply 1 application. topically as needed for moderate pain. First choice is lidocaine patch, but if not covered/expensive, please fill ointment What changed:  when to take this additional instructions   Linzess 72 MCG capsule Generic drug: linaclotide Take 72 mcg by mouth every morning.   loratadine 10 MG tablet Commonly known as: CLARITIN Take 1 tablet (10 mg total) by mouth daily.   metoprolol succinate 25 MG 24 hr tablet Commonly known as: TOPROL-XL Take 1 tablet (25 mg total) by mouth daily. What changed: See the new instructions.   MULTIVITAMIN WOMEN PO Take 1 tablet by mouth  daily.   nitroGLYCERIN 0.4 MG SL tablet Commonly known as: NITROSTAT Place 1 tablet (0.4 mg total) under the tongue every 5 (five) minutes x 3 doses as needed for chest pain.   NovoLOG 100 UNIT/ML injection Generic drug: insulin aspart Inject 2-10 Units into the skin 3 (three) times daily before meals. Per sliding scale What changed:  when to take this additional instructions   pantoprazole 40 MG tablet Commonly known as: PROTONIX TAKE ONE TABLET BY MOUTH DAILY AT 9AM What changed: See the new instructions.   polyethylene glycol powder 17 GM/SCOOP powder Commonly known as: GLYCOLAX/MIRALAX Take 17 g by mouth daily. What changed:  when to take this reasons to take this   pregabalin 100 MG capsule Commonly known as: LYRICA TAKE 1 CAPSULE BY MOUTH THREE TIMES A DAY What changed: See the new instructions. Notes to patient: Afternoon   Restasis 0.05 % ophthalmic emulsion Generic drug: cycloSPORINE Place 1 drop into both eyes in the morning and at bedtime.   rosuvastatin 10 MG tablet Commonly known as: CRESTOR Take 1 tablet (10 mg total) by mouth every evening. What changed: when to take this   senna-docusate 8.6-50 MG tablet Commonly known as: Senokot-S Take 1 tablet by mouth 2 (two) times daily.   Trulicity 1.5 HQ/7.5FF Sopn Generic drug: Dulaglutide INJECT 1.5 MG INTO THE SKIN ONCE A WEEK. What changed: when to take this        Follow-up Information     Loel Dubonnet, NP Follow up.   Specialty: Cardiology Why: Hospital follow-up with Cardiology scheduled for 04/17/2022 at 10:30am. Please arrive 15 minutes early for check-in. If this date/time does not work for you, please call our office to reschedule. Contact information: [place] [address] [address] [employer].   Why: Schedule is Tuesday/Thursday/Saturday with 6:25 am chair time.  First treatment will be Tuesday, August 1. If possible,  clinic requests that paperwork be completed day before first treatment (between 9:00-2:00). If not possible, please arrive 5:45-6:00 am to complete paperwork prior to first treatment. Contact information: [place] [address] [phone]         [facility] - Follow up in 6 week(s).   Specialty: Vascular Surgery Contact information: [address] [place] [address] [phone]               Discharge Exam: Danley Danker Weights   03/18/22 0300 03/18/22 0356 03/18/22 0705  Weight: 129.2 kg 129.8 kg (!) 140.6 kg   BP 126/62   Pulse 80   Temp 98.3 F (36.8 C) (Oral)   Resp 19   Ht $R'5\' 7"'ci$  (1.702 m)   Wt (!) 140.6 kg   SpO2 99%   BMI 48.55 kg/m   Patient is feeling well, no dyspnea and edema  has improved. Currently on hemodialysis.   Neurology awake and alert ENT with mild pallor Cardiovascular with S1 and S2 present and rhythmic with no gallops, positive systolic murmur at the left lower sternal border, no rubs Respiratory with no rale or wheezing Abdomen not distended Lower extremity with non pitting edema bilaterally.   Condition at discharge: stable  The results of significant diagnostics from this hospitalization (including imaging, microbiology, ancillary and laboratory) are listed below for reference.   Imaging Studies: VAS [place] UPPER EXT VEIN MAPPING (PRE-OP AVF)  Result Date: 03/15/2022 UPPER EXTREMITY VEIN MAPPING Patient Name:  [name]  Date of Exam:   03/15/2022 Medical Rec #: [id number]       Accession #:    [id number] Date of Birth: [birth year]       Patient Gender: F Patient Age:   68 years Exam Location[facility]  [facility] Procedure:      VAS [place] UPPER EXT VEIN MAPPING (PRE-OP AVF) Referring Phys: Corliss Parish --------------------------------------------------------------------------------  Indications: Pre-access. Comparison Study: No prior studies. Performing Technologist: Oliver Hum  RVT  Examination Guidelines: A complete evaluation includes B-mode imaging, spectral Doppler, color Doppler, and power Doppler as needed of all accessible portions of each vessel. Bilateral testing is considered an integral part of a complete examination. Limited examinations for reoccurring indications may be performed as noted. +-----------------+-------------+----------+---------+ Right Cephalic   Diameter (cm)Depth (cm)Findings  +-----------------+-------------+----------+---------+ Shoulder             0.37        1.60             +-----------------+-------------+----------+---------+ Prox upper arm       0.30        1.60             +-----------------+-------------+----------+---------+ Mid upper arm        0.28        1.80   branching +-----------------+-------------+----------+---------+ Dist upper arm       0.20        1.30             +-----------------+-------------+----------+---------+ Antecubital fossa    0.33        0.38   branching +-----------------+-------------+----------+---------+ Prox forearm         0.23        0.77   branching +-----------------+-------------+----------+---------+ Mid forearm          0.16        0.83             +-----------------+-------------+----------+---------+ Dist forearm         0.18        0.51             +-----------------+-------------+----------+---------+ +-----------------+-------------+----------+---------+ Right Basilic    Diameter (cm)Depth (cm)Findings  +-----------------+-------------+----------+---------+ Shoulder             0.26        2.40             +-----------------+-------------+----------+---------+ Dist upper arm       0.32        2.10             +-----------------+-------------+----------+---------+ Antecubital fossa    0.27        1.90   branching +-----------------+-------------+----------+---------+ Prox forearm         0.08        0.85              +-----------------+-------------+----------+---------+ Mid forearm  0.04        0.37             +-----------------+-------------+----------+---------+ Distal forearm       0.05        0.20             +-----------------+-------------+----------+---------+ +-----------------+-------------+----------+---------+ Left Cephalic    Diameter (cm)Depth (cm)Findings  +-----------------+-------------+----------+---------+ Shoulder             0.31        1.40             +-----------------+-------------+----------+---------+ Prox upper arm       0.30        1.50   branching +-----------------+-------------+----------+---------+ Mid upper arm        0.26        1.50   branching +-----------------+-------------+----------+---------+ Dist upper arm       0.31        0.83             +-----------------+-------------+----------+---------+ Antecubital fossa    0.28        0.71   branching +-----------------+-------------+----------+---------+ Prox forearm         0.11        0.99             +-----------------+-------------+----------+---------+ Mid forearm          0.14        0.81             +-----------------+-------------+----------+---------+ Dist forearm         0.11        0.53             +-----------------+-------------+----------+---------+ +-----------------+-------------+----------+---------+ Left Basilic     Diameter (cm)Depth (cm)Findings  +-----------------+-------------+----------+---------+ Shoulder             0.27        2.60             +-----------------+-------------+----------+---------+ Mid upper arm        0.27        2.40             +-----------------+-------------+----------+---------+ Dist upper arm       0.28        2.00             +-----------------+-------------+----------+---------+ Antecubital fossa    0.29        2.00   branching +-----------------+-------------+----------+---------+ Prox forearm          0.16        0.91   branching +-----------------+-------------+----------+---------+ Mid forearm          0.08        0.72             +-----------------+-------------+----------+---------+ Distal forearm       0.04        0.44             +-----------------+-------------+----------+---------+ *See table(s) above for measurements and observations.  Diagnosing physician: Monica Martinez MD Electronically signed by Monica Martinez MD on 03/15/2022 at 6:25:27 PM.    Final    IR Fluoro Guide CV Line Right  Result Date: 03/14/2022 INDICATION: 66 year old female referred for tunneled hemodialysis catheter EXAM: TUNNELED CENTRAL VENOUS HEMODIALYSIS CATHETER PLACEMENT WITH ULTRASOUND AND FLUOROSCOPIC GUIDANCE MEDICATIONS: 2 g Ancef. The antibiotic was given in an appropriate time interval prior to skin puncture. ANESTHESIA/SEDATION: Moderate (conscious) sedation was employed during this procedure. A total of Versed 1.0 mg and Fentanyl 25  mcg was administered intravenously by the radiology nurse. Total intra-service moderate Sedation Time: 15 minutes. The patient's level of consciousness and vital signs were monitored continuously by radiology nursing throughout the procedure under my direct supervision. FLUOROSCOPY: Radiation Exposure Index (as provided by the fluoroscopic device): 2 mGy Kerma COMPLICATIONS: None PROCEDURE: Informed written consent was obtained from the patient after a discussion of the risks, benefits, and alternatives to treatment. Questions regarding the procedure were encouraged and answered. The right neck and chest were prepped with chlorhexidine in a sterile fashion, and a sterile drape was applied covering the operative field. Maximum barrier sterile technique with sterile gowns and gloves were used for the procedure. A timeout was performed prior to the initiation of the procedure. Ultrasound survey was performed. The right internal jugular vein was confirmed to be patent, with  images stored and sent to PACS. Micropuncture kit was utilized to access the right internal jugular vein under direct, real-time ultrasound guidance after the overlying soft tissues were anesthetized with 1% lidocaine with epinephrine. Stab incision was made with 11 blade scalpel. Microwire was passed centrally. The microwire was then marked to measure appropriate internal catheter length. External tunneled length was estimated. A total tip to cuff length of 19 cm was selected. 035 guidewire was advanced to the level of the IVC. Skin and subcutaneous tissues of chest wall below the clavicle were generously infiltrated with 1% lidocaine for local anesthesia. A small stab incision was made with 11 blade scalpel. The selected hemodialysis catheter was tunneled in a retrograde fashion from the anterior chest wall to the venotomy incision. Serial dilation was performed and then a peel-away sheath was placed. The catheter was then placed through the peel-away sheath with tips ultimately positioned within the superior aspect of the right atrium. Final catheter positioning was confirmed and documented with a spot radiographic image. The catheter aspirates and flushes normally. The catheter was flushed with appropriate volume heparin dwells. The catheter exit site was secured with a 0-Prolene retention suture. Gel-Foam slurry was infused into the soft tissue tract. The venotomy incision was closed Derma bond and sterile dressing. Dressings were applied at the chest wall. Patient tolerated the procedure well and remained hemodynamically stable throughout. No complications were encountered and no significant blood loss encountered. IMPRESSION: Status post right IJ tunneled hemodialysis catheter Signed, Dulcy Fanny. Nadene Rubins, RPVI [facility] Radiology Electronically Signed   By: Corrie Mckusick D.O.   On: 03/14/2022 10:34   IR US Guide Vasc Access Right  Result Date:  03/14/2022 INDICATION: 66 year old female referred for tunneled hemodialysis catheter EXAM: TUNNELED CENTRAL VENOUS HEMODIALYSIS CATHETER PLACEMENT WITH ULTRASOUND AND FLUOROSCOPIC GUIDANCE MEDICATIONS: 2 g Ancef. The antibiotic was given in an appropriate time interval prior to skin puncture. ANESTHESIA/SEDATION: Moderate (conscious) sedation was employed during this procedure. A total of Versed 1.0 mg and Fentanyl 25 mcg was administered intravenously by the radiology nurse. Total intra-service moderate Sedation Time: 15 minutes. The patient's level of consciousness and vital signs were monitored continuously by radiology nursing throughout the procedure under my direct supervision. FLUOROSCOPY: Radiation Exposure Index (as provided by the fluoroscopic device): 2 mGy Kerma COMPLICATIONS: None PROCEDURE: Informed written consent was obtained from the patient after a discussion of the risks, benefits, and alternatives to treatment. Questions regarding the procedure were encouraged and answered. The right neck and chest were prepped with chlorhexidine in a sterile fashion, and a sterile drape was applied covering the operative field. Maximum barrier sterile technique  with sterile gowns and gloves were used for the procedure. A timeout was performed prior to the initiation of the procedure. Ultrasound survey was performed. The right internal jugular vein was confirmed to be patent, with images stored and sent to PACS. Micropuncture kit was utilized to access the right internal jugular vein under direct, real-time ultrasound guidance after the overlying soft tissues were anesthetized with 1% lidocaine with epinephrine. Stab incision was made with 11 blade scalpel. Microwire was passed centrally. The microwire was then marked to measure appropriate internal catheter length. External tunneled length was estimated. A total tip to cuff length of 19 cm was selected. 035 guidewire was advanced to the level of the IVC. Skin  and subcutaneous tissues of chest wall below the clavicle were generously infiltrated with 1% lidocaine for local anesthesia. A small stab incision was made with 11 blade scalpel. The selected hemodialysis catheter was tunneled in a retrograde fashion from the anterior chest wall to the venotomy incision. Serial dilation was performed and then a peel-away sheath was placed. The catheter was then placed through the peel-away sheath with tips ultimately positioned within the superior aspect of the right atrium. Final catheter positioning was confirmed and documented with a spot radiographic image. The catheter aspirates and flushes normally. The catheter was flushed with appropriate volume heparin dwells. The catheter exit site was secured with a 0-Prolene retention suture. Gel-Foam slurry was infused into the soft tissue tract. The venotomy incision was closed Derma bond and sterile dressing. Dressings were applied at the chest wall. Patient tolerated the procedure well and remained hemodynamically stable throughout. No complications were encountered and no significant blood loss encountered. IMPRESSION: Status post right IJ tunneled hemodialysis catheter Signed, Dulcy Fanny. Nadene Rubins, RPVI [facility] Radiology Electronically Signed   By: Corrie Mckusick D.O.   On: 03/14/2022 10:34   ECHOCARDIOGRAM LIMITED  Result Date: 03/13/2022    ECHOCARDIOGRAM LIMITED REPORT   Patient Name:   [name] Date of Exam: 03/13/2022 Medical Rec #:  [id number]      Height:       67.0 in Accession #:    [id number]     Weight:       280.9 lb Date of Birth:  1956-08-05      BSA:          2.336 m Patient Age:    36 years       BP:           109/64 mmHg Patient Gender: F              HR:           75 bpm. Exam Location:  Inpatient Procedure: Limited Echo Indications:    Congestive heart failure  History:        Patient has prior history of Echocardiogram examinations, most                  recent 01/20/2022. CHF, COPD; Risk Factors:Diabetes, Hypertension                 and Sleep Apnea.  Sonographer:    Jefferey Pica Referring Phys: [id number] [place]  1. Abnormal septal motion . Left ventricular ejection fraction, by estimation, is 50 to 55%. The left ventricle has low normal function. The left ventricle has no regional wall motion abnormalities.  2. Right ventricular systolic function is normal. The right ventricular size is normal.  3. Left atrial  size was severely dilated.  4. Right atrial size was severely dilated.  5. Post repair with annuloplasty ring no doppler/color flow done. The mitral valve was not assessed. No evidence of mitral valve regurgitation. No evidence of mitral stenosis.  6. Post repair with ring no doppler/color flow done. Tricuspid valve regurgitation is not demonstrated.  7. The aortic valve was not assessed. Aortic valve regurgitation is not visualized. No aortic stenosis is present.  8. The inferior vena cava is normal in size with greater than 50% respiratory variability, suggesting right atrial pressure of 3 mmHg. FINDINGS  Left Ventricle: Abnormal septal motion. Left ventricular ejection fraction, by estimation, is 50 to 55%. The left ventricle has low normal function. The left ventricle has no regional wall motion abnormalities. The left ventricular internal cavity size was normal in size. There is no left ventricular hypertrophy. Right Ventricle: The right ventricular size is normal. No increase in right ventricular wall thickness. Right ventricular systolic function is normal. Left Atrium: Left atrial size was severely dilated. Right Atrium: Right atrial size was severely dilated. Pericardium: There is no evidence of pericardial effusion. Mitral Valve: Post repair with annuloplasty ring no doppler/color flow done. The mitral valve was not assessed. No evidence of mitral valve stenosis. Tricuspid Valve: Post repair with ring no  doppler/color flow done. The tricuspid valve is not assessed. Tricuspid valve regurgitation is not demonstrated. No evidence of tricuspid stenosis. Aortic Valve: The aortic valve was not assessed. Aortic valve regurgitation is not visualized. No aortic stenosis is present. Pulmonic Valve: The pulmonic valve was not assessed. Pulmonic valve regurgitation is not visualized. No evidence of pulmonic stenosis. Aorta: The aortic root is normal in size and structure. Venous: The inferior vena cava is normal in size with greater than 50% respiratory variability, suggesting right atrial pressure of 3 mmHg. IAS/Shunts: No atrial level shunt detected by color flow Doppler. LEFT VENTRICLE PLAX 2D LVIDd:         5.35 cm LVIDs:         3.70 cm LV PW:         1.05 cm LV IVS:        0.80 cm  LV Volumes (MOD) LV vol d, MOD A4C: 144.0 ml LV vol s, MOD A4C: 70.3 ml LV SV MOD A4C:     144.0 ml IVC IVC diam: 2.30 cm LEFT ATRIUM              Index        RIGHT ATRIUM           Index LA diam:        5.60 cm  2.40 cm/m   RA Area:     31.60 cm LA Vol (A2C):   109.0 ml 46.66 ml/m  RA Volume:   129.00 ml 55.22 ml/m LA Vol (A4C):   99.5 ml  42.59 ml/m LA Biplane Vol: 105.0 ml 44.94 ml/m   AORTA Ao Root diam: 3.10 cm Ao Asc diam:  3.00 cm Jenkins Rouge MD Electronically signed by Jenkins Rouge MD Signature Date/Time: 03/13/2022/10:21:28 AM    Final    US RENAL  Result Date: 03/12/2022 CLINICAL DATA:  Acute kidney injury. EXAM: RENAL / URINARY TRACT ULTRASOUND COMPLETE COMPARISON:  CT AP 03/08/2022 FINDINGS: Right Kidney: Renal measurements: 9.8 x 5.3 by 5.7 cm = volume: 154.7 mL. Echogenicity within normal limits. No mass or hydronephrosis visualized. Left Kidney: Renal measurements: 10.0 x 5.5 x 5.4 cm = volume: 153.0 mL. Echogenicity within normal limits.  No mass or hydronephrosis visualized. Bladder: Appears normal for degree of bladder distention. Other: None. IMPRESSION: Normal renal sonogram. Electronically Signed   By: Kerby Moors M.D.   On: 03/12/2022 12:52   VAS US CAROTID  Result Date: 03/09/2022 Carotid Arterial Duplex Study Patient Name:  [name]  Date of Exam:   03/09/2022 Medical Rec #: [id number]       Accession #:    [id number] Date of Birth: 10-27-1955       Patient Gender: F Patient Age:   58 years Exam Location:  [facility] Procedure:      VAS US CAROTID Referring Phys: Wandra Feinstein RATHORE --------------------------------------------------------------------------------  Risk Factors:      Hypertension, hyperlipidemia, Diabetes. Comparison Study:  12/24/2020 1-39% bilateral ICA stenosis Performing Technologist: Maudry Mayhew MHA, RDMS, RVT, RDCS  Examination Guidelines: A complete evaluation includes B-mode imaging, spectral Doppler, color Doppler, and power Doppler as needed of all accessible portions of each vessel. Bilateral testing is considered an integral part of a complete examination. Limited examinations for reoccurring indications may be performed as noted.  Right Carotid Findings: +----------+--------+--------+--------+--------------------------+--------+           PSV cm/sEDV cm/sStenosisPlaque Description        Comments +----------+--------+--------+--------+--------------------------+--------+ CCA Prox  120     19                                                 +----------+--------+--------+--------+--------------------------+--------+ CCA Distal84      16                                                 +----------+--------+--------+--------+--------------------------+--------+ ICA Prox  65      19              irregular and heterogenous         +----------+--------+--------+--------+--------------------------+--------+ ICA Distal78      29                                                 +----------+--------+--------+--------+--------------------------+--------+ ECA       87                                                          +----------+--------+--------+--------+--------------------------+--------+ +----------+--------+-------+----------------+-------------------+           PSV cm/sEDV cmsDescribe        Arm Pressure (mmHG) +----------+--------+-------+----------------+-------------------+ [id number]            Multiphasic, WNL                    +----------+--------+-------+----------------+-------------------+ +---------+--------+--+--------+--+---------+ VertebralPSV cm/s68EDV cm/s21Antegrade +---------+--------+--+--------+--+---------+  Left Carotid Findings: +----------+--------+--------+--------+-------------------------------+--------+           PSV cm/sEDV cm/sStenosisPlaque Description             Comments +----------+--------+--------+--------+-------------------------------+--------+ CCA Prox  109     18                                                      +----------+--------+--------+--------+-------------------------------+--------+  CCA Distal89      25                                                      +----------+--------+--------+--------+-------------------------------+--------+ ICA Prox  73      22              heterogenous, focal and                                                   calcific                                +----------+--------+--------+--------+-------------------------------+--------+ ICA Distal76      22                                                      +----------+--------+--------+--------+-------------------------------+--------+ ECA       100     10                                                      +----------+--------+--------+--------+-------------------------------+--------+ +----------+--------+--------+----------------+-------------------+           PSV cm/sEDV cm/sDescribe        Arm Pressure (mmHG) +----------+--------+--------+----------------+-------------------+ [id number]              Multiphasic, WNL                    +----------+--------+--------+----------------+-------------------+ +---------+--------+--+--------+--+---------+ VertebralPSV cm/s78EDV cm/s24Antegrade +---------+--------+--+--------+--+---------+   Summary: Right Carotid: Velocities in the right ICA are consistent with a 1-39% stenosis. Left Carotid: Velocities in the left ICA are consistent with a 1-39% stenosis. Vertebrals:  Bilateral vertebral arteries demonstrate antegrade flow. Subclavians: Normal flow hemodynamics were seen in bilateral subclavian              arteries. *See table(s) above for measurements and observations.  Electronically signed by Jamelle Haring on 03/09/2022 at 8:21:24 PM.    Final    CT Angio Chest/Abd/Pel for Dissection W and/or Wo Contrast  Result Date: 03/08/2022 CLINICAL DATA:  Acute aortic syndrome (AAS) EXAM: CT ANGIOGRAPHY CHEST, ABDOMEN AND PELVIS TECHNIQUE: Non-contrast CT of the chest was initially obtained. Multidetector CT imaging through the chest, abdomen and pelvis was performed using the standard protocol during bolus administration of intravenous contrast. Multiplanar reconstructed images and MIPs were obtained and reviewed to evaluate the vascular anatomy. RADIATION DOSE REDUCTION: This exam was performed according to the departmental dose-optimization program which includes automated exposure control, adjustment of the mA and/or kV according to patient size and/or use of iterative reconstruction technique. CONTRAST:  149mL OMNIPAQUE IOHEXOL 350 MG/ML SOLN COMPARISON:  CT chest 11/21/2021, CT abdomen pelvis 09/23/2021 FINDINGS: CTA CHEST FINDINGS Cardiovascular: Mitral and tricuspid valve replacement has been performed. There is stable global cardiomegaly. No pericardial effusion. Left atrial clipping has been performed  central pulmonary arteries are enlarged in keeping with changes of pulmonary arterial hypertension, stable since prior examination. The thoracic aorta is  normal in course and caliber there is no evidence of intramural hematoma, aneurysm, or dissection. Bovine arch anatomy with wide patency of the arch vasculature proximally. 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 Mediastinum/Nodes: No enlarged mediastinal, hilar, or axillary lymph nodes. Thyroid gland, trachea, and esophagus demonstrate no significant findings. Lungs/Pleura: Lungs are clear. No pleural effusion or pneumothorax. Musculoskeletal: No acute bone abnormality. No lytic or blastic bone lesion. Review of the MIP images confirms the above findings. CTA ABDOMEN AND PELVIS FINDINGS VASCULAR Aorta: Mild atherosclerotic calcification within the abdominal aorta. No aortic aneurysm or dissection. No periaortic inflammatory change. Celiac: Patent without evidence of aneurysm, dissection, vasculitis or significant stenosis. SMA: Patent without evidence of aneurysm, dissection, vasculitis or significant stenosis. Renals: Single renal arteries bilaterally. No evidence of hemodynamically significant stenosis. Normal vascular morphology. 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 IMA: Patent without evidence of aneurysm, dissection, vasculitis or significant stenosis. Inflow: Patent without evidence of aneurysm, dissection, vasculitis or significant stenosis. Veins: No obvious venous abnormality within the limitations of this arterial phase study. Review of the MIP images confirms the above findings. NON-VASCULAR Hepatobiliary: No focal liver abnormality is seen. Status post cholecystectomy. No biliary dilatation. Pancreas: Unremarkable Spleen: Unremarkable  Adrenals/Urinary Tract: Adrenal glands are unremarkable. Kidneys are normal, without renal calculi, focal lesion, or hydronephrosis. Bladder is unremarkable. Stomach/Bowel: Stomach is within normal limits. Appendix appears normal. No evidence of bowel wall thickening, distention, or inflammatory changes. Lymphatic: A shotty gastrohepatic lymph node is identified at axial image # 141/5 measuring 10 mm in short axis diameter, unchanged from prior examination though slightly enlarged since remote prior examination of 03/04/2020, nonspecific. No additional pathologic adenopathy is seen within the abdomen and pelvis. Reproductive: Status post hysterectomy. No adnexal masses. Other: No abdominal wall hernia Musculoskeletal: No acute bone abnormality. No lytic or blastic bone lesion. Review of the MIP images confirms the above findings. IMPRESSION: 1. No evidence of thoracoabdominal aortic aneurysm or dissection. 2. 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 This is incompletely evaluated on this examination. Correlation with carotid artery duplex sonography or CT arteriography may be helpful for further evaluation. 3. 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 Stable global cardiomegaly. 4. Stable enlargement of the central pulmonary arteries in keeping with changes of pulmonary arterial hypertension. 5. 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 The more proximal demonstrates a bilobed configuration with thrombosis of 1 of the lobes measuring 1.7 x 2.1 x 2.2 cm the second demonstrating broad communication with the feeding vessel measures 1.3 x 1.6 x 1.4 cm in dimension. 6. Shotty gastrohepatic lymph node measuring 10 mm in short axis diameter, unchanged from prior examination though slightly enlarged since remote prior examination of 03/04/2020, nonspecific. Electronically Signed   By: Fidela Salisbury M.D.   On: 03/08/2022  20:02   DG Chest Portable 1 View  Result Date: 03/08/2022 CLINICAL DATA:  Provided history: Chest pain, shortness of breath. History of atrial  fibrillation and congestive heart failure. Prior open heart surgery. EXAM: PORTABLE CHEST 1 VIEW COMPARISON:  Prior chest radiographs 02/12/2022 and earlier. FINDINGS: Prior median sternotomy. Prior left atrial appendage clipping. Cardiomegaly. No appreciable airspace consolidation or pulmonary edema. No evidence of pleural effusion or pneumothorax. No acute bony abnormality identified. IMPRESSION: No evidence of acute cardiopulmonary abnormality. Cardiomegaly. Electronically Signed   By: Kellie Simmering D.O.   On: 03/08/2022 15:23   CARDIAC CATHETERIZATION  Result Date: 02/22/2022 1. Normal PCWP 2. Mild pulmonary venous hypertension. 3. Mildly elevated RA pressure. 4. Preserved cardiac output.    Microbiology: Results for orders placed or performed during the hospital encounter of 03/08/22  SARS Coronavirus 2 by RT PCR (hospital order, performed in [facility] hospital lab) *cepheid single result test* Anterior Nasal Swab     Status: None   Collection Time: 03/08/22  3:55 PM   Specimen: Anterior Nasal Swab  Result Value Ref Range Status   SARS Coronavirus 2 by RT PCR NEGATIVE NEGATIVE Final    Comment: (NOTE) SARS-CoV-2 target nucleic acids are NOT DETECTED.  The SARS-CoV-2 RNA is generally detectable in upper and lower respiratory specimens during the acute phase of infection. The lowest concentration of SARS-CoV-2 viral copies this assay can detect is 250 copies / mL. A negative result does not preclude SARS-CoV-2 infection and should not be used as the sole basis for treatment or other patient management decisions.  A negative result may occur with improper specimen collection / handling, submission of specimen other than nasopharyngeal swab, presence of viral mutation(s) within the areas targeted by this assay, and inadequate number of viral  copies (<250 copies / mL). A negative result must be combined with clinical observations, patient history, and epidemiological information.  Fact Sheet for Patients:   https://www.patel.info/  Fact Sheet for Healthcare Providers: https://hall.com/  This test is not yet approved or  cleared by the [place] FDA and has been authorized for detection and/or diagnosis of SARS-CoV-2 by FDA under an Emergency Use Authorization (EUA).  This EUA will remain in effect (meaning this test can be used) for the duration of the COVID-19 declaration under Section 564(b)(1) of the Act, 21 U.S.C. section 360bbb-3(b)(1), unless the authorization is terminated or revoked sooner.  Performed at [facility], [address] [place], [place] [address]   Surgical PCR screen     Status: None   Collection Time: 03/12/22  9:20 PM   Specimen: Nasal Mucosa; Nasal Swab  Result Value Ref Range Status   MRSA, PCR NEGATIVE NEGATIVE Final   Staphylococcus aureus NEGATIVE NEGATIVE Final    Comment: (NOTE) The Xpert SA Assay (FDA approved for NASAL specimens in patients 22 years of age and older), is one component of a comprehensive surveillance program. It is not intended to diagnose infection nor to guide or monitor treatment. Performed at [facility], [place] [address] [place], [place] [address]   Surgical pcr screen     Status: None   Collection Time: 03/16/22  7:45 PM   Specimen: Nasal Mucosa; Nasal Swab  Result Value Ref Range Status   MRSA, PCR NEGATIVE NEGATIVE Final   Staphylococcus aureus NEGATIVE NEGATIVE Final    Comment: (NOTE) The Xpert SA Assay (FDA approved for NASAL specimens in patients 73 years of age and older), is one component of a comprehensive surveillance program. It is not intended to diagnose infection nor to guide or monitor treatment. Performed at [facility], [place],  [place] [address]     Labs: CBC: Recent Labs  Lab 03/14/22 1004 03/15/22 0200 03/16/22 0156 03/17/22 0138 03/18/22 0141  WBC 3.7* 5.3 5.8 6.5 6.8  HGB 9.5* 8.9* 9.2* 9.1* 8.9*  HCT 29.6* 27.0* 28.3* 28.8* 28.7*  MCV 83.6 82.8 82.5 84.2 85.7  PLT 146* 125* 132* 129* 394*   Basic Metabolic Panel: Recent Labs  Lab 03/12/22 0758 03/13/22 0652 03/14/22 1004 03/15/22 0200 03/16/22 0156 03/17/22 0138 03/18/22 0141 03/18/22 0726  NA 139 138 137 137 137 137 139 140  K 3.9 3.7 3.4* 3.5 3.4* 3.4* 3.9 3.8  CL 104 104 104 105 103 103 106 107  CO2 $Re'26 23 24 22 27 24 24 26  'YRm$ GLUCOSE 109* 98 115* 146* 106* 135* 138* 141*  BUN 58* 67* 71* 58* 39* 47* 48* 50*  CREATININE 6.41* 6.78* 6.70* 5.47* 3.90* 4.62* 4.62* 4.43*  CALCIUM 9.1 8.9 9.0 8.6* 8.9  8.8 9.0 9.0 8.9  MG 2.0  --   --   --   --   --   --   --   PHOS  --  6.1* 5.6*  --   --   --   --  4.2   Liver Function Tests: Recent Labs  Lab 03/12/22 0758 03/13/22 0652 03/14/22 1004 03/18/22 0726  AST 24  --   --   --   ALT 21  --   --   --   ALKPHOS 122  --   --   --   BILITOT 0.6  --   --   --   PROT 7.3  --   --   --   ALBUMIN 3.2* 3.1* 3.3* 3.0*   CBG: Recent Labs  Lab 03/17/22 0932 03/17/22 0959 03/17/22 1206 03/17/22 1601 03/17/22 2127  GLUCAP 99 101* 194* 127* 161*    Discharge time spent: greater than 30 minutes.  Signed: Tawni Millers, MD [facility] 03/18/2022

## 2022-03-18 NOTE — Progress Notes (Signed)
  Progress Note    03/18/2022 9:32 AM 1 Day Post-Op  Subjective:  patient seen on hd this morning.  Tolerating treatment well via R IJ TDC.  Denies signs or symptoms of steal in L hand   Vitals:   03/18/22 0837 03/18/22 0900  BP: 117/66 126/62  Pulse: 81 80  Resp: 17 19  Temp:    SpO2: 99% 99%   Physical Exam: Lungs:  non labored Incisions:  L arm incisions c/d/I without hematoma Extremities:  palpable L radial pulse; palpable thrill near anastomosis, some pulsatility as you move up the arm Neurologic: a&O  CBC    Component Value Date/Time   WBC 6.8 03/18/2022 0141   RBC 3.35 (L) 03/18/2022 0141   HGB 8.9 (L) 03/18/2022 0141   HGB 10.5 (L) 04/14/2021 0957   HCT 28.7 (L) 03/18/2022 0141   HCT 32.5 (L) 04/14/2021 0957   PLT 134 (L) 03/18/2022 0141   PLT 216 04/14/2021 0957   MCV 85.7 03/18/2022 0141   MCV 85 04/14/2021 0957   MCH 26.6 03/18/2022 0141   MCHC 31.0 03/18/2022 0141   RDW 19.9 (H) 03/18/2022 0141   RDW 15.3 04/14/2021 0957   LYMPHSABS 1.8 11/22/2021 0244   LYMPHSABS 2.6 10/07/2019 1337   MONOABS 1.4 (H) 11/22/2021 0244   EOSABS 0.2 11/22/2021 0244   EOSABS 0.2 10/07/2019 1337   BASOSABS 0.1 11/22/2021 0244   BASOSABS 0.0 10/07/2019 1337    BMET    Component Value Date/Time   NA 140 03/18/2022 0726   NA 142 01/11/2022 1134   K 3.8 03/18/2022 0726   CL 107 03/18/2022 0726   CO2 26 03/18/2022 0726   GLUCOSE 141 (H) 03/18/2022 0726   BUN 50 (H) 03/18/2022 0726   BUN 20 01/11/2022 1134   CREATININE 4.43 (H) 03/18/2022 0726   CALCIUM 8.9 03/18/2022 0726   CALCIUM 8.8 03/16/2022 0156   GFRNONAA 10 (L) 03/18/2022 0726   GFRAA 33 (L) 09/08/2020 1054    INR    Component Value Date/Time   INR 2.3 (H) 11/24/2021 2151     Intake/Output Summary (Last 24 hours) at 03/18/2022 0932 Last data filed at 03/17/2022 1734 Gross per 24 hour  Intake 14.23 ml  Output 1450 ml  Net -1435.77 ml     Assessment/Plan:  66 y.o. female is s/p L  brachiocephalic fistula creation with transposition 1 Day Post-Op   Patent L brachiocephalic fistula without signs or symptoms of steal syndrome Ok to transition to oral anticoagulation Office will arrange fistula duplex in 4-6 weeks; ok for discharge from vascular standpoint   Dagoberto Ligas, PA-C [facility] [phone] 03/18/2022 9:32 AM

## 2022-03-18 NOTE — Assessment & Plan Note (Addendum)
Hypokalemia.   Patient was admitted to the hospital with volume overload did not respond to diuresis and developed worsening renal function, now ESRD. Renal replacement therapy has been started with good toleration.  Patient will continue hemodialysis as out patient.   Discharge K is 3,8 and serum bicarbonate at 26.   Anemia of chronic kidney disease, present on admission.  Received IV iron and ESA Follow up as outpatient, her discharge hgb is 8.9  Thrombocytopenia with plt 197   Metabolic bone disease with PTH 86, P with in range. Follow up as outpatient.

## 2022-03-18 NOTE — Assessment & Plan Note (Addendum)
Patient will continue rate and rhythm control with amiodarone. Continue anticoagulation with apixaban.   Follow up EKG had persistent qtc prolongation 527. 1st degree AV block with positive PVC.

## 2022-03-18 NOTE — Procedures (Signed)
Patient was seen on dialysis and the procedure was supervised.  BFR 300  Via TDC BP is  127/64.   Patient appears to be tolerating treatment well  Louis Meckel 03/18/2022

## 2022-03-18 NOTE — Hospital Course (Signed)
[name] was admitted to the hospital with the working diagnosis of decompensated heart failure.   66 yo female with the past medical history of heart failure, sp mitral and tricuspid valve replacement, paroxysmal atrial fibrillation, sp Maze procedure in 2022, T2DM, CKD stage IV, COPD, obesity, dyslipidemia and hypertension who presented with chest pain. Reported 3 days of persistent chest pain, 8/10 in intensity, non exertional, radiating to left arm and back, associated with dizzines and nausea. Positive weight gain 8 lbs over 2 weeks, lower extremity edema and orthopnea. On her initial physical examination her blood pressure was 138/92, HR 71, RR 17 and 02 saturation 98%, lungs with bilateral rales, heart with S1 and S2 present and rhythmic, abdomen not distended and positive lower extremity edema.   Na 1443, K 3,2 Cl 104, bicarbonate 29, glucose 84, bun 37 cr 2,46  BNP 205  High sensitive troponin 5  Wbc 4,5 hgb 10.7 plt 184  Sars covid 19 negative   Chest radiograph with cardiomegaly with no infiltrates and no effusions  CT chest with faint bilateral ground glass opacities. No evidence of aneurysm or dissection in the thoracoabdominal aorta. 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   EKG 76 bpm, normal axis, qtc 544, sinus rhythm, with no significant ST segment or T wave changes.   Patient was placed on diuretic therapy for volume overload.  She had worsening renal function, by 07/23 her serum cr reached 6.41 despite holding diuretic therapy.  Positive uremic symptoms with persistent volume overload.  07/25 had right IJ tunneled HD cathter placed and underwent first hemodialysis treatment.  07/28 vascular surgery performed left brachiocephalic fistula creation.   Plan will continue renal replacement therapy on Tuesday, Thursday and Saturday schedule.

## 2022-03-18 NOTE — Progress Notes (Signed)
.  ANTICOAGULATION CONSULT NOTE  Pharmacy Consult for Heparin > Apixaban Indication: atrial fibrillation  Allergies  Allergen Reactions   Bee Pollen Anaphylaxis and Other (See Comments)    UNCONFIRMED, but IS allergic to bee VENOM   Bee Venom Anaphylaxis   Sulfa Antibiotics Itching    Patient Measurements: Height: '5\' 7"'$  (170.2 cm) Weight: (!) 140.6 kg (309 lb 15.5 oz) IBW/kg (Calculated) : 61.6   Vital Signs: Temp: 98.3 F (36.8 C) (07/29 0705) Temp Source: Oral (07/29 0705) BP: 138/51 (07/29 1030) Pulse Rate: 79 (07/29 1030)  Labs: Recent Labs    03/16/22 0156 03/17/22 0138 03/18/22 0141 03/18/22 0726  HGB 9.2* 9.1* 8.9*  --   HCT 28.3* 28.8* 28.7*  --   PLT 132* 129* 134*  --   HEPARINUNFRC  --   --  0.73*  --   CREATININE 3.90* 4.62* 4.62* 4.43*     Estimated Creatinine Clearance: 18.6 mL/min (A) (by C-G formula based on SCr of 4.43 mg/dL (H)).   Medical History: Past Medical History:  Diagnosis Date   Acute on chronic systolic CHF (congestive heart failure) (HCC) 12/21/2020   Allergic rhinitis    Arthritis    Asthma    Chronic diastolic CHF (congestive heart failure) (HCC)    COPD (chronic obstructive pulmonary disease) (HCC)    Depression    DM (diabetes mellitus) ([place])    DVT (deep vein thrombosis) in pregnancy    GERD (gastroesophageal reflux disease)    HTN (hypertension)    Hyperlipidemia    Obesity    Pneumonia 04/2018   RIGHT LOBE   Sleep apnea    compliant with CPAP     Assessment: 66 YO F with persistent afib previously on apixaban. Pharmacy consulted to manage heparin while apixaban was on hold for AVG placement. Per conversation with MD, ok to resume PTA apixaban.    Goal of Therapy:  Monitor platelets by anticoagulation protocol: Yes   Plan:  Stop heparin infusion @ 1100 (confirmed with HD RN) Resume PTA Apixaban '5mg'$  PO twice daily Continue to monitor H&H and platelets     Thank you for allowing pharmacy to be a part of  this patient's care.  Ardyth Harps, PharmD Clinical Pharmacist

## 2022-03-18 NOTE — Assessment & Plan Note (Signed)
Calculated BMI is 48.5

## 2022-03-18 NOTE — Assessment & Plan Note (Signed)
No acute exacerbation  

## 2022-03-18 NOTE — Progress Notes (Signed)
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 Now likely ESRD at this point. -ineffective diuresis with recurrent admissions for hypervolemia and now with uremic symptoms, Dr. Candiss Norse had a lengthy discussion in regards to renal replacement therapy, decision was made to start-TDC placed 7/25 followed by first HD  -second HD 7/26 and third today- [facility]-  she feels comfortable leaving today and knows to go to the OP center on Tuesday ( maybe Monday to sign papers       Acute on chronic combined CHF -Does make urine which will make her HD more manageable- does not need diuretics  Chest pain -Per cardiology and primary.   Paroxysmal Afib -On amio 200 mg daily, metoprolol 25 mg bid-  has been decreased to daily for low BP which I agree with  -Eliquis on hold d/t dialysis catheter placement today, was on heparin in the interim  Anemia of chronic disease -Transfuse for Hb<7 Hgb dropping slowly with events-  getting iron and ESA   Bones-  PTH 86-    phos is not bad-  no binder yet  HTN-  dont think will tolerate hydralazine with low BP  Subjective:    Pain from arm and neck still but feels comfortable going home today   Objective Vital signs in last 24 hours: Vitals:   03/18/22 0300 03/18/22 0356 03/18/22 0705 03/18/22 0712  BP:  109/60  127/64  Pulse:  88  81  Resp:  18  20  Temp:  99 F (37.2 C) 98.3 F (36.8 C)   TempSrc:  Oral Oral   SpO2:  96%  97%  Weight: 129.2 kg 129.8 kg (!) 140.6 kg   Height: '5\' 7"'$  (1.702 m)      Weight change: 0 kg  Intake/Output Summary (Last 24 hours) at 03/18/2022 0726 Last data filed at 03/17/2022 1734 Gross per 24 hour  Intake 414.23 ml  Output 1450 ml  Net -1035.77 ml    Labs: Basic Metabolic Panel: Recent Labs  Lab 03/13/22 0652 03/14/22 1004 03/15/22 0200 03/16/22 0156 03/17/22 0138 03/18/22 0141  NA 138 137   < > 137 137 139  K 3.7  3.4*   < > 3.4* 3.4* 3.9  CL 104 104   < > 103 103 106  CO2 23 24   < > '27 24 24  '$ GLUCOSE 98 115*   < > 106* 135* 138*  BUN 67* 71*   < > 39* 47* 48*  CREATININE 6.78* 6.70*   < > 3.90* 4.62* 4.62*  CALCIUM 8.9 9.0   < > 8.9  8.8 9.0 9.0  PHOS 6.1* 5.6*  --   --   --   --    < > = values in this interval not displayed.   Liver Function Tests: Recent Labs  Lab 03/12/22 0758 03/13/22 0652 03/14/22 1004  AST 24  --   --   ALT 21  --   --   ALKPHOS 122  --   --   BILITOT 0.6  --   --   PROT 7.3  --   --   ALBUMIN 3.2* 3.1* 3.3*   No results for input(s): "LIPASE", "AMYLASE" in the last 168 hours. No results for input(s): "AMMONIA" in the last 168 hours. CBC: Recent Labs  Lab 03/14/22 1004 03/15/22 0200 03/16/22 0156 03/17/22 0138 03/18/22 0141  WBC 3.7* 5.3 5.8 6.5 6.8  HGB 9.5* 8.9* 9.2* 9.1* 8.9*  HCT 29.6* 27.0* 28.3* 28.8* 28.7*  MCV 83.6 82.8 82.5 84.2 85.7  PLT 146* 125* 132* 129* 134*   Cardiac Enzymes: No results for input(s): "CKTOTAL", "CKMB", "CKMBINDEX", "TROPONINI" in the last 168 hours. CBG: Recent Labs  Lab 03/17/22 0932 03/17/22 0959 03/17/22 1206 03/17/22 1601 03/17/22 2127  GLUCAP 99 101* 194* 127* 161*    Iron Studies:  No results for input(s): "IRON", "TIBC", "TRANSFERRIN", "FERRITIN" in the last 72 hours.  Studies/Results: No results found.  Medications: Infusions:  sodium chloride Stopped (03/14/22 1900)   heparin 800 Units/hr (03/18/22 0353)    Scheduled Medications:  amiodarone  200 mg Oral Daily   busPIRone  5 mg Oral BID   Chlorhexidine Gluconate Cloth  6 each Topical Q0600   colchicine  0.3 mg Oral Daily   cycloSPORINE  1 drop Both Eyes BID   darbepoetin (ARANESP) injection - NON-DIALYSIS  60 mcg Subcutaneous Q Mon-1800   DULoxetine  20 mg Oral Daily   famotidine  20 mg Oral Daily   fluticasone  2 spray Each Nare Daily   insulin aspart  0-5 Units Subcutaneous QHS   insulin aspart  0-6 Units Subcutaneous TID WC    isosorbide mononitrate  15 mg Oral Daily   linaclotide  72 mcg Oral q morning   loratadine  10 mg Oral Daily   metoprolol succinate  25 mg Oral Daily   pantoprazole  40 mg Oral QAC breakfast   polyethylene glycol powder  17 g Oral Daily   rosuvastatin  10 mg Oral Daily   senna-docusate  1 tablet Oral BID   torsemide  60 mg Oral q AM    have reviewed scheduled and prn medications.  Physical Exam: General: Resting comfortably, no acute distress Heart: Regular rate, rhythm. No murmurs. Lungs: Normal respiratory effort. Clear to auscultation bilaterally. Abdomen: Soft, non-tender, obese abdomen Extremities: No pitting edema in lower extremities Neuro: Awake, alert, conversing appropriately. No focal deficits. Dialysis Access: right IJ tunneled HD cath  and new LUE AVF-  good bruit   [name] A [name]  03/18/2022,7:26 AM  LOS: 8 days

## 2022-03-18 NOTE — Progress Notes (Signed)
.  ANTICOAGULATION CONSULT NOTE - Follow Up Consult  Pharmacy Consult for Apixaban > heparin  Indication: atrial fibrillation  Allergies  Allergen Reactions   Bee Pollen Anaphylaxis and Other (See Comments)    UNCONFIRMED, but IS allergic to bee VENOM   Bee Venom Anaphylaxis   Sulfa Antibiotics Itching    Patient Measurements: Height: '5\' 7"'$  (170.2 cm) Weight: 129.2 kg (284 lb 13.4 oz) IBW/kg (Calculated) : 61.6   Vital Signs: Temp: 98.8 F (37.1 C) (07/28 2100) Temp Source: Oral (07/28 2100) BP: 130/58 (07/28 2100) Pulse Rate: 78 (07/28 2100)  Labs: Recent Labs    03/16/22 0156 03/17/22 0138 03/18/22 0141  HGB 9.2* 9.1* 8.9*  HCT 28.3* 28.8* 28.7*  PLT 132* 129* 134*  HEPARINUNFRC  --   --  0.73*  CREATININE 3.90* 4.62* 4.62*     Estimated Creatinine Clearance: 17 mL/min (A) (by C-G formula based on SCr of 4.62 mg/dL (H)).   Medical History: Past Medical History:  Diagnosis Date   Acute on chronic systolic CHF (congestive heart failure) (HCC) 12/21/2020   Allergic rhinitis    Arthritis    Asthma    Chronic diastolic CHF (congestive heart failure) (HCC)    COPD (chronic obstructive pulmonary disease) (HCC)    Depression    DM (diabetes mellitus) ([place])    DVT (deep vein thrombosis) in pregnancy    GERD (gastroesophageal reflux disease)    HTN (hypertension)    Hyperlipidemia    Obesity    Pneumonia 04/2018   RIGHT LOBE   Sleep apnea    compliant with CPAP     Assessment: 66 YO F with persistent afib previously on heparin. Patient on apixaban 5 mg PO BID PTA. CHA2DS2/VAS of 6. Pharmacy consulted to restart dose of apixaban.    Afib dosing criteria: wt > 60kg, age < 48, SCr >1.5. Based upon dosing criteria, 5 mg dose is appropriate.   Apixaban held for AVG placement  Per [facility] ok to start heparin drip this evening - monitor bleeding and resume apixaban 7/29 am if stable.  AM update: Heparin level of 0.73 is supratherapeutic. Level drawn appropriately  per RN. No bleeding noted per RN. Hgb 8.9. Plts 134.    Goal of Therapy:  Monitor platelets by anticoagulation protocol: Yes   Plan:  Decrease heparin to 800 units/hr  Follow up plans to resume apixaban this morning versus recheck heparin level   Cristela Felt, PharmD, BCPS Clinical Pharmacist 03/18/2022 3:43 AM

## 2022-03-18 NOTE — Assessment & Plan Note (Signed)
Patient was placed on insulin sliding scale for glucose cover and monitoring.  Glucose remained controlled for inpatient criteria, 135 to 161 capillary glucose.

## 2022-03-18 NOTE — Assessment & Plan Note (Addendum)
TEE 01/2022 Echocardiogram with preserved LV systolic function with EF 62%, RV systolic function severely reduced, right ventricle cavity with severe enlargement. Bilateral atrial enlargement. Severe tricuspid valve regurgitation. Follow up limited echocardiogram with improvement in TR (possible volume related)  Acute on chronic core pulmonale Pulmonary hypertension.   Patient initially placed on aggressive diuresis for volume overload, furosemide and metolazone. Unfortunately did not respond and developed worsening renal failure.  Patient will continue volume control through ultrafiltration during hemodialysis.   Continue isosorbide and metoprolol Systemic Hypertension: Holding hydralazine to prevent hypotension.

## 2022-03-19 NOTE — TOC Transition Note (Signed)
Transition of care contact from inpatient facility  Date of discharge: 03/18/22 Date of contact: 03/19/22 Method: Phone Spoke to: Patient  Patient contacted to discuss transition of care from recent inpatient hospitalization. Patient was admitted to [facility] from 03/08/22-03/18/22 with discharge diagnosis of AKI on CKD (progressed to ESRD) and acute on chronic CHF. S/p AVF placed by VVS on 03/17/22. She's c/o swelling and pain around the AVF which is expected. Advised to take Tylenol '650mg'$  every 6-8hrs as needed for pain. [facility]VS to reach out to patient to schedule f/u appt. Advised her to relay pain issue to [facility] as well.  Medication changes were reviewed.  Patient will follow up with her outpatient HD unit on: Tuesday 03/21/22 at [facility].  Tobie Poet, NP

## 2022-03-20 ENCOUNTER — Other Ambulatory Visit (HOSPITAL_BASED_OUTPATIENT_CLINIC_OR_DEPARTMENT_OTHER): Payer: Self-pay

## 2022-03-20 ENCOUNTER — Telehealth (INDEPENDENT_AMBULATORY_CARE_PROVIDER_SITE_OTHER): Payer: Self-pay | Admitting: Primary Care

## 2022-03-20 ENCOUNTER — Encounter (HOSPITAL_COMMUNITY): Payer: Self-pay | Admitting: Vascular Surgery

## 2022-03-20 NOTE — Telephone Encounter (Signed)
LM on pts VM to call Butch Penny back at [phone] to try to get scheduled sooner.

## 2022-03-20 NOTE — Telephone Encounter (Signed)
Medication discontinued 09/27/2020. Requested Prescriptions  Pending Prescriptions Disp Refills  . metFORMIN (GLUCOPHAGE) 1000 MG tablet [Pharmacy Med Name: METFORMIN HCL 1,000 MG TABLET] 180 tablet 3    Sig: TAKE 1 TABLET (1,000 MG TOTAL) BY MOUTH 2 (TWO) TIMES DAILY WITH A MEAL.     Endocrinology:  Diabetes - Biguanides Failed - 03/18/2022  6:11 PM      Failed - Cr in normal range and within 360 days    Creatinine, Ser  Date Value Ref Range Status  03/18/2022 4.43 (H) 0.44 - 1.00 mg/dL Final   Creatinine, Urine  Date Value Ref Range Status  07/29/2021 97.28 mg/dL Final    Comment:    Performed at [facility], [place] [address] [place], [place] [address]         Failed - eGFR in normal range and within 360 days    GFR calc Af Amer  Date Value Ref Range Status  09/08/2020 33 (L) >59 mL/min/1.73 Final    Comment:    **In accordance with recommendations from the NKF-ASN Task force,**   [facility] is in the process of updating its eGFR calculation to the   2021 CKD-EPI creatinine equation that estimates kidney function   without a race variable.    GFR, Estimated  Date Value Ref Range Status  03/18/2022 10 (L) >60 mL/min Final    Comment:    (NOTE) Calculated using the CKD-EPI Creatinine Equation (2021)    eGFR  Date Value Ref Range Status  01/11/2022 23 (L) >59 mL/min/1.73 Final         Passed - HBA1C is between 0 and 7.9 and within 180 days    HbA1c, POC (controlled diabetic range)  Date Value Ref Range Status  09/24/2020 9.9 (A) 0.0 - 7.0 % Final   Hgb A1c MFr Bld  Date Value Ref Range Status  11/21/2021 6.7 (H) 4.8 - 5.6 % Final    Comment:    (NOTE)         Prediabetes: 5.7 - 6.4         Diabetes: >6.4         Glycemic control for adults with diabetes: <7.0          Passed - B12 Level in normal range and within 720 days    Vitamin B-12  Date Value Ref Range Status  11/22/2021 645 180 - 914 pg/mL Final    Comment:    (NOTE) This assay is not validated  for testing neonatal or myeloproliferative syndrome specimens for Vitamin B12 levels. Performed at [facility], [place] [address] [place], [place] [address]          Passed - Valid encounter within last 6 months    Recent Outpatient Visits          2 months ago Acute frontal sinusitis, recurrence not specified   [place], Michelle P, NP   6 months ago Hospital discharge follow-up   [place] Juluis Mire P, NP   7 months ago Chest pain due to myocardial ischemia, unspecified ischemic chest pain type   [facility] [name], NP   10 months ago Gastroesophageal reflux disease without esophagitis   [facility] [name], NP   10 months ago Erroneous encounter - disregard   [place], Michelle P, NP      Future Appointments  In 4 weeks Loel Dubonnet, NP [facility]           Passed - CBC within normal limits and completed in the last 12 months    WBC  Date Value Ref Range Status  03/18/2022 6.8 4.0 - 10.5 K/uL Final   RBC  Date Value Ref Range Status  03/18/2022 3.35 (L) 3.87 - 5.11 MIL/uL Final   Hemoglobin  Date Value Ref Range Status  03/18/2022 8.9 (L) 12.0 - 15.0 g/dL Final  04/14/2021 10.5 (L) 11.1 - 15.9 g/dL Final   Total hemoglobin  Date Value Ref Range Status  01/11/2021 9.6 (L) 12.0 - 16.0 g/dL Final   HCT  Date Value Ref Range Status  03/18/2022 28.7 (L) 36.0 - 46.0 % Final   Hematocrit  Date Value Ref Range Status  04/14/2021 32.5 (L) 34.0 - 46.6 % Final   MCHC  Date Value Ref Range Status  03/18/2022 31.0 30.0 - 36.0 g/dL Final   [facility]  Date Value Ref Range Status  03/18/2022 26.6 26.0 - 34.0 pg Final   MCV  Date Value Ref Range Status  03/18/2022 85.7 80.0 - 100.0 fL Final  04/14/2021 85 79 - 97 fL Final   No results found for:  "PLTCOUNTKUC", "LABPLAT", "POCPLA" RDW  Date Value Ref Range Status  03/18/2022 19.9 (H) 11.5 - 15.5 % Final  04/14/2021 15.3 11.7 - 15.4 % Final

## 2022-03-20 NOTE — Telephone Encounter (Signed)
[id number] Pt is calling to request a sooner hospital follow up. First available appt is 04/10/22 Please advise

## 2022-03-23 ENCOUNTER — Encounter (HOSPITAL_COMMUNITY): Payer: 59

## 2022-03-27 ENCOUNTER — Encounter (HOSPITAL_COMMUNITY): Payer: Self-pay | Admitting: Vascular Surgery

## 2022-03-27 NOTE — Anesthesia Postprocedure Evaluation (Signed)
Anesthesia Post Note  Patient: DARLINE FAITH  Procedure(s) Performed: LEFT ARM ARTERIOVENOUS (AV) FISTULA CREATION (Left: Arm Upper) FISTULA SUPERFICIALIZATION -LEFT (Left)     Patient location during evaluation: PACU Anesthesia Type: Regional and MAC Level of consciousness: awake and alert Pain management: pain level controlled Vital Signs Assessment: post-procedure vital signs reviewed and stable Respiratory status: spontaneous breathing, nonlabored ventilation, respiratory function stable and patient connected to nasal cannula oxygen Cardiovascular status: stable and blood pressure returned to baseline Postop Assessment: no apparent nausea or vomiting Anesthetic complications: no   No notable events documented.  Last Vitals:  Vitals:   03/18/22 1052 03/18/22 1115  BP: 125/66 124/79  Pulse: 81 84  Resp: (!) 21 19  Temp: 36.7 C 36.6 C  SpO2: 100% 100%    Last Pain:  Vitals:   03/18/22 1115  TempSrc: Oral  PainSc: 0-No pain                 Joi Leyva S

## 2022-03-28 ENCOUNTER — Ambulatory Visit (INDEPENDENT_AMBULATORY_CARE_PROVIDER_SITE_OTHER): Payer: Self-pay

## 2022-03-28 NOTE — Telephone Encounter (Signed)
Pt called, asked what questions she had for [name], NP. Pt states she started dialysis and has some questions but would speak with her when she has appt scheduled for 03/31/22. Advised her that appt was for her medicare wellness visit and unsure if [name] would be present on that call. Offered her OV for 03/31/22 but pt states she wouldn't be able to come in that day and didn't want me to see if TV would be ok for this day. Pt states she would talk to [name] the next time she gets the chance.  Summary: questions   Pt is on dialysis and asked to speak with [name] to ask some questions

## 2022-03-28 NOTE — Telephone Encounter (Signed)
Routed to PCP 

## 2022-03-29 ENCOUNTER — Other Ambulatory Visit (INDEPENDENT_AMBULATORY_CARE_PROVIDER_SITE_OTHER): Payer: Self-pay | Admitting: Primary Care

## 2022-03-29 ENCOUNTER — Other Ambulatory Visit: Payer: Self-pay | Admitting: Family Medicine

## 2022-03-29 NOTE — Telephone Encounter (Signed)
Routed to PCP 

## 2022-03-31 ENCOUNTER — Telehealth (INDEPENDENT_AMBULATORY_CARE_PROVIDER_SITE_OTHER): Payer: Self-pay

## 2022-03-31 ENCOUNTER — Ambulatory Visit (INDEPENDENT_AMBULATORY_CARE_PROVIDER_SITE_OTHER): Payer: 59

## 2022-03-31 ENCOUNTER — Encounter (INDEPENDENT_AMBULATORY_CARE_PROVIDER_SITE_OTHER): Payer: Self-pay

## 2022-03-31 DIAGNOSIS — Z0000 Encounter for general adult medical examination without abnormal findings: Secondary | ICD-10-CM | POA: Diagnosis not present

## 2022-03-31 NOTE — Progress Notes (Signed)
Entered in ERROR AWV completed 10/28/2021 by Merton Border.

## 2022-03-31 NOTE — Telephone Encounter (Signed)
AWV completed 10/28/2021 by Merton Border. Visit not completed today.

## 2022-04-01 ENCOUNTER — Emergency Department (HOSPITAL_COMMUNITY)
Admission: EM | Admit: 2022-04-01 | Discharge: 2022-04-01 | Disposition: A | Discharge status: Home / Self Care | Payer: 59 | Attending: Emergency Medicine | Admitting: Emergency Medicine

## 2022-04-01 ENCOUNTER — Other Ambulatory Visit: Payer: Self-pay

## 2022-04-01 ENCOUNTER — Emergency Department (HOSPITAL_COMMUNITY): Payer: 59

## 2022-04-01 DIAGNOSIS — N186 End stage renal disease: Secondary | ICD-10-CM

## 2022-04-01 DIAGNOSIS — R42 Dizziness and giddiness: Secondary | ICD-10-CM | POA: Insufficient documentation

## 2022-04-01 DIAGNOSIS — Z7901 Long term (current) use of anticoagulants: Secondary | ICD-10-CM | POA: Diagnosis not present

## 2022-04-01 DIAGNOSIS — R2243 Localized swelling, mass and lump, lower limb, bilateral: Secondary | ICD-10-CM | POA: Diagnosis not present

## 2022-04-01 DIAGNOSIS — Z20822 Contact with and (suspected) exposure to covid-19: Secondary | ICD-10-CM | POA: Diagnosis not present

## 2022-04-01 DIAGNOSIS — R0602 Shortness of breath: Secondary | ICD-10-CM | POA: Insufficient documentation

## 2022-04-01 DIAGNOSIS — R079 Chest pain, unspecified: Secondary | ICD-10-CM | POA: Diagnosis present

## 2022-04-01 DIAGNOSIS — R531 Weakness: Secondary | ICD-10-CM | POA: Diagnosis not present

## 2022-04-01 DIAGNOSIS — I4891 Unspecified atrial fibrillation: Secondary | ICD-10-CM | POA: Insufficient documentation

## 2022-04-01 DIAGNOSIS — Z79899 Other long term (current) drug therapy: Secondary | ICD-10-CM | POA: Diagnosis not present

## 2022-04-01 DIAGNOSIS — R011 Cardiac murmur, unspecified: Secondary | ICD-10-CM | POA: Insufficient documentation

## 2022-04-01 DIAGNOSIS — I5042 Chronic combined systolic (congestive) and diastolic (congestive) heart failure: Secondary | ICD-10-CM | POA: Diagnosis not present

## 2022-04-01 DIAGNOSIS — R0789 Other chest pain: Secondary | ICD-10-CM | POA: Diagnosis not present

## 2022-04-01 DIAGNOSIS — Z794 Long term (current) use of insulin: Secondary | ICD-10-CM | POA: Diagnosis not present

## 2022-04-01 DIAGNOSIS — Z992 Dependence on renal dialysis: Secondary | ICD-10-CM | POA: Insufficient documentation

## 2022-04-01 DIAGNOSIS — I5033 Acute on chronic diastolic (congestive) heart failure: Secondary | ICD-10-CM | POA: Diagnosis not present

## 2022-04-01 LAB — BASIC METABOLIC PANEL
Anion gap: 9 (ref 5–15)
BUN: 7 mg/dL — ABNORMAL LOW (ref 8–23)
CO2: 34 mmol/L — ABNORMAL HIGH (ref 22–32)
Calcium: 8.7 mg/dL — ABNORMAL LOW (ref 8.9–10.3)
Chloride: 95 mmol/L — ABNORMAL LOW (ref 98–111)
Creatinine, Ser: 2.16 mg/dL — ABNORMAL HIGH (ref 0.44–1.00)
GFR, Estimated: 25 mL/min — ABNORMAL LOW (ref >60)
Glucose, Bld: 104 mg/dL — ABNORMAL HIGH (ref 70–99)
Potassium: 3.3 mmol/L — ABNORMAL LOW (ref 3.5–5.1)
Sodium: 138 mmol/L (ref 135–145)

## 2022-04-01 LAB — CBC
HCT: 32.2 % — ABNORMAL LOW (ref 36.0–46.0)
Hemoglobin: 9.8 g/dL — ABNORMAL LOW (ref 12.0–15.0)
MCH: 27.8 pg (ref 26.0–34.0)
MCHC: 30.4 g/dL (ref 30.0–36.0)
MCV: 91.2 fL (ref 80.0–100.0)
Platelets: 185 10*3/uL (ref 150–400)
RBC: 3.53 MIL/uL — ABNORMAL LOW (ref 3.87–5.11)
RDW: 21.2 % — ABNORMAL HIGH (ref 11.5–15.5)
WBC: 6 10*3/uL (ref 4.0–10.5)
nRBC: 0 % (ref 0.0–0.2)

## 2022-04-01 LAB — BRAIN NATRIURETIC PEPTIDE: B Natriuretic Peptide: 222.7 pg/mL — ABNORMAL HIGH (ref 0.0–100.0)

## 2022-04-01 LAB — TROPONIN I (HIGH SENSITIVITY)
Troponin I (High Sensitivity): 7 ng/L (ref <18)
Troponin I (High Sensitivity): 5 ng/L (ref <18)

## 2022-04-01 LAB — SARS CORONAVIRUS 2 BY RT PCR: SARS Coronavirus 2 by RT PCR: NEGATIVE

## 2022-04-01 NOTE — ED Triage Notes (Addendum)
Pt here w/ chest pain- feels like someone is sitting on her chest and stabbing,  SHOB and dizziness since this AM at 0830 at dialysis.  Pt is a new dialysis patient Tu[facility]. Chest port for dialysis in place. Was able to finish complete treatment today. Patient became more alarmed because while at lunch the chest pain became worse so she decided to call EMS.  VS:  106 palpated  HR 81  CBG 103   324 ASA given PTA. Aox4.

## 2022-04-01 NOTE — ED Provider Notes (Signed)
[facility] EMERGENCY DEPARTMENT Provider Note   CSN: [id number] Arrival date & time: 04/01/22  1349     History  Chief Complaint  Patient presents with   Chest Pain    ELFIE COSTANZA is a 66 y.o. female.  Patient w hx esrd/hd, had hd today, chf, afib on anticoag therapy, valvular heart disease, c/o feeling sob and chest tightness in the past day. Symptoms acute onset, moderate, persistent, dull, non radiating. No nausea/vomiting or diaphoresis. States felt generally weak, lightheaded. No vertigo. Denies headache. No abd pain or nv. Makes small amount urine every couple days, no dysuria. Compliant w home meds. Notes increased bil leg edema. Pt denies increased cough or new uri symptoms. No fever or chills. No unilateral leg pain or swelling.   The history is provided by the patient and medical records.  Chest Pain Associated symptoms: shortness of breath and weakness   Associated symptoms: no abdominal pain, no back pain, no cough, no fever, no headache and no numbness        Home Medications Prior to Admission medications   Medication Sig Start Date End Date Taking? Authorizing Provider  acetaminophen (TYLENOL) 325 MG tablet Take 2 tablets (650 mg total) by mouth every 6 (six) hours as needed for mild pain or moderate pain. 12/07/21   Arrien, Jimmy Picket, MD  albuterol (VENTOLIN HFA) 108 (90 Base) MCG/ACT inhaler INHALE 1-2 PUFFS BY MOUTH EVERY 6 HOURS AS NEEDED FOR WHEEZE OR SHORTNESS OF BREATH Patient taking differently: Inhale 2 puffs into the lungs every 6 (six) hours as needed for wheezing or shortness of breath. 02/24/22   Shelly Coss, MD  amiodarone (PACERONE) 200 MG tablet Take 1 tablet (200 mg total) by mouth daily. 03/18/22 04/17/22  Arrien, Jimmy Picket, MD  apixaban (ELIQUIS) 5 MG TABS tablet TAKE ONE TABLET BY MOUTH TWICE DAILY at [place] Patient taking differently: Take 5 mg by mouth in the morning and at bedtime. 02/24/22   Shelly Coss,  MD  busPIRone (BUSPAR) 5 MG tablet TAKE 1 TABLET BY MOUTH TWICE A DAY Patient taking differently: Take 5 mg by mouth 2 (two) times daily. 02/26/22   [name], NP  colchicine 0.6 MG tablet Take 0.5 tablets (0.3 mg total) by mouth daily. 12/08/21   Charlott Rakes, MD  diphenhydrAMINE (BENADRYL) 25 mg capsule Take 25 mg by mouth every 6 (six) hours as needed for allergies or itching.    [provider]  DULoxetine (CYMBALTA) 60 MG capsule TAKE 1 CAPSULE BY MOUTH EVERY DAY Patient taking differently: Take 60 mg by mouth daily. 02/26/22   [name], NP  EPINEPHrine 0.3 mg/0.3 mL IJ SOAJ injection Inject 0.3 mg into the muscle as needed for anaphylaxis. 06/25/20   [name], NP  fluticasone (FLONASE) 50 MCG/ACT nasal spray Place 2 sprays into both nostrils daily. Patient taking differently: Place 2 sprays into both nostrils daily as needed for allergies or rhinitis. 12/29/21   [name], NP  insulin aspart (NOVOLOG) 100 UNIT/ML injection Inject 2-10 Units into the skin 3 (three) times daily before meals. Per sliding scale Patient taking differently: Inject 2-10 Units into the skin See admin instructions. Inject 2-10 units into the skin three times a day before meals, per sliding scale 09/02/21   Aline August, MD  isosorbide mononitrate (IMDUR) 30 MG 24 hr tablet Take 0.5 tablets (15 mg total) by mouth daily. 03/18/22 04/17/22  Arrien, Jimmy Picket, MD  lidocaine (XYLOCAINE) 5 %  ointment Apply 1 application. topically as needed for moderate pain. First choice is lidocaine patch, but if not covered/expensive, please fill ointment Patient taking differently: Apply 1 application  topically 2 (two) times daily as needed for moderate pain. 11/08/21   Buford Dresser, MD  LINZESS 72 MCG capsule Take 72 mcg by mouth every morning. 02/11/22   [provider]  loratadine (CLARITIN) 10 MG tablet Take 1 tablet (10 mg total) by mouth daily. 12/29/21   [name], NP  metoprolol succinate (TOPROL-XL) 25 MG 24 hr tablet Take 1 tablet (25 mg total) by mouth daily. 03/18/22 04/17/22  Arrien, Jimmy Picket, MD  Multiple Vitamins-Minerals (MULTIVITAMIN WOMEN PO) Take 1 tablet by mouth daily.    [provider]  nitroGLYCERIN (NITROSTAT) 0.4 MG SL tablet Place 1 tablet (0.4 mg total) under the tongue every 5 (five) minutes x 3 doses as needed for chest pain. 01/08/14   Charolette Forward, MD  pantoprazole (PROTONIX) 40 MG tablet TAKE ONE TABLET BY MOUTH DAILY AT 9AM Patient taking differently: Take 40 mg by mouth daily before breakfast. 01/20/22   [name], NP  polyethylene glycol powder (GLYCOLAX/MIRALAX) 17 GM/SCOOP powder Take 17 g by mouth daily. Patient taking differently: Take 17 g by mouth daily as needed for mild constipation. 02/25/22   Shelly Coss, MD  pregabalin (LYRICA) 100 MG capsule TAKE 1 CAPSULE BY MOUTH THREE TIMES A DAY Patient taking differently: Take 100 mg by mouth 3 (three) times daily as needed (for pain). 02/26/22   [name], NP  RESTASIS 0.05 % ophthalmic emulsion Place 1 drop into both eyes in the morning and at bedtime. 11/19/17   [provider]  rosuvastatin (CRESTOR) 10 MG tablet Take 1 tablet (10 mg total) by mouth every evening. Patient taking differently: Take 10 mg by mouth daily. 09/02/21   Aline August, MD  senna-docusate (SENOKOT-S) 8.6-50 MG tablet Take 1 tablet by mouth 2 (two) times daily. 02/24/22   Shelly Coss, MD  TRULICITY 1.5 PX/1.0GY SOPN INJECT 1.5 MG INTO THE SKIN ONCE A WEEK. Patient taking differently: Inject 1.5 mg into the skin every Wednesday. 02/26/22   [name], NP      Allergies    Bee pollen, Bee venom, and Sulfa antibiotics    Review of Systems   Review of Systems  Constitutional:  Negative for chills and fever.  HENT:  Negative for sore throat.   Eyes:  Negative for redness.  Respiratory:  Positive for shortness of breath. Negative for cough.    Cardiovascular:  Positive for chest pain. Negative for leg swelling.  Gastrointestinal:  Negative for abdominal pain.  Genitourinary:  Negative for flank pain.  Musculoskeletal:  Negative for back pain and neck pain.  Skin:  Negative for rash.  Neurological:  Positive for weakness. Negative for speech difficulty, numbness and headaches.  Hematological:  Does not bruise/bleed easily.  Psychiatric/Behavioral:  Negative for confusion.     Physical Exam Updated Vital Signs BP 108/74   Pulse 82   Temp 98.4 F (36.9 C) (Oral)   Resp (!) 28   SpO2 100%  Physical Exam Vitals and nursing note reviewed.  Constitutional:      Appearance: Normal appearance. She is well-developed.  HENT:     Head: Atraumatic.     Nose: Nose normal.     Mouth/Throat:     Mouth: Mucous membranes are moist.  Eyes:     General: No scleral icterus.    Conjunctiva/sclera:  Conjunctivae normal.     Pupils: Pupils are equal, round, and reactive to light.  Neck:     Trachea: No tracheal deviation.  Cardiovascular:     Rate and Rhythm: Normal rate and regular rhythm.     Pulses: Normal pulses.     Heart sounds: Murmur heard.     No friction rub. No gallop.  Pulmonary:     Effort: Pulmonary effort is normal. No respiratory distress.     Breath sounds: Normal breath sounds.     Comments: HD cath right chest without sign of infection Abdominal:     General: Bowel sounds are normal. There is no distension.     Palpations: Abdomen is soft.     Tenderness: There is no abdominal tenderness.  Genitourinary:    Comments: No cva tenderness.  Musculoskeletal:     Cervical back: Normal range of motion and neck supple. No rigidity. No muscular tenderness.     Right lower leg: Edema present.     Left lower leg: Edema present.     Comments: Moderate, symmetric bil leg edema.   Skin:    General: Skin is warm and dry.     Findings: No rash.  Neurological:     Mental Status: She is alert.     Comments: Alert,  speech normal. Motor/sens grossly intact bil.   Psychiatric:        Mood and Affect: Mood normal.     ED Results / Procedures / Treatments   Labs (all labs ordered are listed, but only abnormal results are displayed) Results for orders placed or performed during the hospital encounter of 04/01/22  SARS Coronavirus 2 by RT PCR (hospital order, performed in [facility] hospital lab) *cepheid single result test* Anterior Nasal Swab   Specimen: Anterior Nasal Swab  Result Value Ref Range   SARS Coronavirus 2 by RT PCR NEGATIVE NEGATIVE  Basic metabolic panel  Result Value Ref Range   Sodium 138 135 - 145 mmol/L   Potassium 3.3 (L) 3.5 - 5.1 mmol/L   Chloride 95 (L) 98 - 111 mmol/L   CO2 34 (H) 22 - 32 mmol/L   Glucose, Bld 104 (H) 70 - 99 mg/dL   BUN 7 (L) 8 - 23 mg/dL   Creatinine, Ser 2.16 (H) 0.44 - 1.00 mg/dL   Calcium 8.7 (L) 8.9 - 10.3 mg/dL   GFR, Estimated 25 (L) >60 mL/min   Anion gap 9 5 - 15  CBC  Result Value Ref Range   WBC 6.0 4.0 - 10.5 K/uL   RBC 3.53 (L) 3.87 - 5.11 MIL/uL   Hemoglobin 9.8 (L) 12.0 - 15.0 g/dL   HCT 32.2 (L) 36.0 - 46.0 %   MCV 91.2 80.0 - 100.0 fL   MCH 27.8 26.0 - 34.0 pg   MCHC 30.4 30.0 - 36.0 g/dL   RDW 21.2 (H) 11.5 - 15.5 %   Platelets 185 150 - 400 K/uL   nRBC 0.0 0.0 - 0.2 %  Brain natriuretic peptide  Result Value Ref Range   B Natriuretic Peptide 222.7 (H) 0.0 - 100.0 pg/mL  Troponin I (High Sensitivity)  Result Value Ref Range   Troponin I (High Sensitivity) 7 <18 ng/L  Troponin I (High Sensitivity)  Result Value Ref Range   Troponin I (High Sensitivity) 5 <18 ng/L   DG Chest 2 View  Result Date: 04/01/2022 CLINICAL DATA:  Shortness of breath, chest pain and lightheadedness. EXAM: CHEST - 2 VIEW COMPARISON:  Chest  radiograph March 08, 2022. FINDINGS: Prior median sternotomy with mitral valve annuloplasty and left atrial occlusion device. Unchanged cardiomegaly. No overt pulmonary edema or focal airspace consolidation. No  visible pleural effusion or pneumothorax. No acute osseous finding. IMPRESSION: Cardiomegaly without overt pulmonary edema or focal airspace consolidation. Electronically Signed   By: Dahlia Bailiff M.D.   On: 04/01/2022 14:47   VAS [place] UPPER EXT VEIN MAPPING (PRE-OP AVF)  Result Date: 03/15/2022 UPPER EXTREMITY VEIN MAPPING Patient Name:  ANALA WHISENANT  Date of Exam:   03/15/2022 Medical Rec #: [id number]       Accession #:    [id number] Date of Birth: [birth year]       Patient Gender: F Patient Age:   67 years Exam Location:  [facility] Procedure:      VAS [place] UPPER EXT VEIN MAPPING (PRE-OP AVF) Referring Phys: Corliss Parish --------------------------------------------------------------------------------  Indications: Pre-access. Comparison Study: No prior studies. Performing Technologist: Oliver Hum RVT  Examination Guidelines: A complete evaluation includes B-mode imaging, spectral Doppler, color Doppler, and power Doppler as needed of all accessible portions of each vessel. Bilateral testing is considered an integral part of a complete examination. Limited examinations for reoccurring indications may be performed as noted. +-----------------+-------------+----------+---------+ Right Cephalic   Diameter (cm)Depth (cm)Findings  +-----------------+-------------+----------+---------+ Shoulder             0.37        1.60             +-----------------+-------------+----------+---------+ Prox upper arm       0.30        1.60             +-----------------+-------------+----------+---------+ Mid upper arm        0.28        1.80   branching +-----------------+-------------+----------+---------+ Dist upper arm       0.20        1.30             +-----------------+-------------+----------+---------+ Antecubital fossa    0.33        0.38   branching +-----------------+-------------+----------+---------+ Prox forearm         0.23        0.77   branching  +-----------------+-------------+----------+---------+ Mid forearm          0.16        0.83             +-----------------+-------------+----------+---------+ Dist forearm         0.18        0.51             +-----------------+-------------+----------+---------+ +-----------------+-------------+----------+---------+ Right Basilic    Diameter (cm)Depth (cm)Findings  +-----------------+-------------+----------+---------+ Shoulder             0.26        2.40             +-----------------+-------------+----------+---------+ Dist upper arm       0.32        2.10             +-----------------+-------------+----------+---------+ Antecubital fossa    0.27        1.90   branching +-----------------+-------------+----------+---------+ Prox forearm         0.08        0.85             +-----------------+-------------+----------+---------+ Mid forearm          0.04        0.37             +-----------------+-------------+----------+---------+  Distal forearm       0.05        0.20             +-----------------+-------------+----------+---------+ +-----------------+-------------+----------+---------+ Left Cephalic    Diameter (cm)Depth (cm)Findings  +-----------------+-------------+----------+---------+ Shoulder             0.31        1.40             +-----------------+-------------+----------+---------+ Prox upper arm       0.30        1.50   branching +-----------------+-------------+----------+---------+ Mid upper arm        0.26        1.50   branching +-----------------+-------------+----------+---------+ Dist upper arm       0.31        0.83             +-----------------+-------------+----------+---------+ Antecubital fossa    0.28        0.71   branching +-----------------+-------------+----------+---------+ Prox forearm         0.11        0.99             +-----------------+-------------+----------+---------+ Mid forearm           0.14        0.81             +-----------------+-------------+----------+---------+ Dist forearm         0.11        0.53             +-----------------+-------------+----------+---------+ +-----------------+-------------+----------+---------+ Left Basilic     Diameter (cm)Depth (cm)Findings  +-----------------+-------------+----------+---------+ Shoulder             0.27        2.60             +-----------------+-------------+----------+---------+ Mid upper arm        0.27        2.40             +-----------------+-------------+----------+---------+ Dist upper arm       0.28        2.00             +-----------------+-------------+----------+---------+ Antecubital fossa    0.29        2.00   branching +-----------------+-------------+----------+---------+ Prox forearm         0.16        0.91   branching +-----------------+-------------+----------+---------+ Mid forearm          0.08        0.72             +-----------------+-------------+----------+---------+ Distal forearm       0.04        0.44             +-----------------+-------------+----------+---------+ *See table(s) above for measurements and observations.  Diagnosing physician: Monica Martinez MD Electronically signed by Monica Martinez MD on 03/15/2022 at 6:25:27 PM.    Final    IR Fluoro Guide CV Line Right  Result Date: 03/14/2022 INDICATION: 66 year old female referred for tunneled hemodialysis catheter EXAM: TUNNELED CENTRAL VENOUS HEMODIALYSIS CATHETER PLACEMENT WITH ULTRASOUND AND FLUOROSCOPIC GUIDANCE MEDICATIONS: 2 g Ancef. The antibiotic was given in an appropriate time interval prior to skin puncture. ANESTHESIA/SEDATION: Moderate (conscious) sedation was employed during this procedure. A total of Versed 1.0 mg and Fentanyl 25 mcg was administered intravenously by the radiology nurse. Total intra-service moderate Sedation Time: 15 minutes. The patient's level of consciousness and vital  signs were monitored continuously by radiology nursing throughout the procedure under my direct supervision. FLUOROSCOPY: Radiation Exposure Index (as provided by the fluoroscopic device): 2 mGy Kerma COMPLICATIONS: None PROCEDURE: Informed written consent was obtained from the patient after a discussion of the risks, benefits, and alternatives to treatment. Questions regarding the procedure were encouraged and answered. The right neck and chest were prepped with chlorhexidine in a sterile fashion, and a sterile drape was applied covering the operative field. Maximum barrier sterile technique with sterile gowns and gloves were used for the procedure. A timeout was performed prior to the initiation of the procedure. Ultrasound survey was performed. The right internal jugular vein was confirmed to be patent, with images stored and sent to PACS. Micropuncture kit was utilized to access the right internal jugular vein under direct, real-time ultrasound guidance after the overlying soft tissues were anesthetized with 1% lidocaine with epinephrine. Stab incision was made with 11 blade scalpel. Microwire was passed centrally. The microwire was then marked to measure appropriate internal catheter length. External tunneled length was estimated. A total tip to cuff length of 19 cm was selected. 035 guidewire was advanced to the level of the IVC. Skin and subcutaneous tissues of chest wall below the clavicle were generously infiltrated with 1% lidocaine for local anesthesia. A small stab incision was made with 11 blade scalpel. The selected hemodialysis catheter was tunneled in a retrograde fashion from the anterior chest wall to the venotomy incision. Serial dilation was performed and then a peel-away sheath was placed. The catheter was then placed through the peel-away sheath with tips ultimately positioned within the superior aspect of the right atrium. Final catheter positioning was confirmed and documented with a spot  radiographic image. The catheter aspirates and flushes normally. The catheter was flushed with appropriate volume heparin dwells. The catheter exit site was secured with a 0-Prolene retention suture. Gel-Foam slurry was infused into the soft tissue tract. The venotomy incision was closed Derma bond and sterile dressing. Dressings were applied at the chest wall. Patient tolerated the procedure well and remained hemodynamically stable throughout. No complications were encountered and no significant blood loss encountered. IMPRESSION: Status post right IJ tunneled hemodialysis catheter Signed, Dulcy Fanny. Nadene Rubins, RPVI [facility] Radiology Electronically Signed   By: Corrie Mckusick D.O.   On: 03/14/2022 10:34   IR US Guide Vasc Access Right  Result Date: 03/14/2022 INDICATION: 66 year old female referred for tunneled hemodialysis catheter EXAM: TUNNELED CENTRAL VENOUS HEMODIALYSIS CATHETER PLACEMENT WITH ULTRASOUND AND FLUOROSCOPIC GUIDANCE MEDICATIONS: 2 g Ancef. The antibiotic was given in an appropriate time interval prior to skin puncture. ANESTHESIA/SEDATION: Moderate (conscious) sedation was employed during this procedure. A total of Versed 1.0 mg and Fentanyl 25 mcg was administered intravenously by the radiology nurse. Total intra-service moderate Sedation Time: 15 minutes. The patient's level of consciousness and vital signs were monitored continuously by radiology nursing throughout the procedure under my direct supervision. FLUOROSCOPY: Radiation Exposure Index (as provided by the fluoroscopic device): 2 mGy Kerma COMPLICATIONS: None PROCEDURE: Informed written consent was obtained from the patient after a discussion of the risks, benefits, and alternatives to treatment. Questions regarding the procedure were encouraged and answered. The right neck and chest were prepped with chlorhexidine in a sterile fashion, and a sterile drape was applied  covering the operative field. Maximum barrier sterile technique with sterile gowns and gloves were used for the procedure. A timeout was performed prior to the initiation of the procedure. Ultrasound  survey was performed. The right internal jugular vein was confirmed to be patent, with images stored and sent to PACS. Micropuncture kit was utilized to access the right internal jugular vein under direct, real-time ultrasound guidance after the overlying soft tissues were anesthetized with 1% lidocaine with epinephrine. Stab incision was made with 11 blade scalpel. Microwire was passed centrally. The microwire was then marked to measure appropriate internal catheter length. External tunneled length was estimated. A total tip to cuff length of 19 cm was selected. 035 guidewire was advanced to the level of the IVC. Skin and subcutaneous tissues of chest wall below the clavicle were generously infiltrated with 1% lidocaine for local anesthesia. A small stab incision was made with 11 blade scalpel. The selected hemodialysis catheter was tunneled in a retrograde fashion from the anterior chest wall to the venotomy incision. Serial dilation was performed and then a peel-away sheath was placed. The catheter was then placed through the peel-away sheath with tips ultimately positioned within the superior aspect of the right atrium. Final catheter positioning was confirmed and documented with a spot radiographic image. The catheter aspirates and flushes normally. The catheter was flushed with appropriate volume heparin dwells. The catheter exit site was secured with a 0-Prolene retention suture. Gel-Foam slurry was infused into the soft tissue tract. The venotomy incision was closed Derma bond and sterile dressing. Dressings were applied at the chest wall. Patient tolerated the procedure well and remained hemodynamically stable throughout. No complications were encountered and no significant blood loss encountered. IMPRESSION:  Status post right IJ tunneled hemodialysis catheter Signed, Dulcy Fanny. Nadene Rubins, RPVI [facility] Radiology Electronically Signed   By: Corrie Mckusick D.O.   On: 03/14/2022 10:34   ECHOCARDIOGRAM LIMITED  Result Date: 03/13/2022    ECHOCARDIOGRAM LIMITED REPORT   Patient Name:   RAKEB KIBBLE Date of Exam: 03/13/2022 Medical Rec #:  [id number]      Height:       67.0 in Accession #:    [id number]     Weight:       280.9 lb Date of Birth:  Apr 04, 1956      BSA:          2.336 m Patient Age:    72 years       BP:           109/64 mmHg Patient Gender: F              HR:           75 bpm. Exam Location:  Inpatient Procedure: Limited Echo Indications:    Congestive heart failure  History:        Patient has prior history of Echocardiogram examinations, most                 recent 01/20/2022. CHF, COPD; Risk Factors:Diabetes, Hypertension                 and Sleep Apnea.  Sonographer:    Jefferey Pica Referring Phys: [id number] [place]  1. Abnormal septal motion . Left ventricular ejection fraction, by estimation, is 50 to 55%. The left ventricle has low normal function. The left ventricle has no regional wall motion abnormalities.  2. Right ventricular systolic function is normal. The right ventricular size is normal.  3. Left atrial size was severely dilated.  4. Right atrial size was severely dilated.  5. Post repair with annuloplasty ring no doppler/color flow done.  The mitral valve was not assessed. No evidence of mitral valve regurgitation. No evidence of mitral stenosis.  6. Post repair with ring no doppler/color flow done. Tricuspid valve regurgitation is not demonstrated.  7. The aortic valve was not assessed. Aortic valve regurgitation is not visualized. No aortic stenosis is present.  8. The inferior vena cava is normal in size with greater than 50% respiratory variability, suggesting right atrial pressure of 3 mmHg. FINDINGS  Left  Ventricle: Abnormal septal motion. Left ventricular ejection fraction, by estimation, is 50 to 55%. The left ventricle has low normal function. The left ventricle has no regional wall motion abnormalities. The left ventricular internal cavity size was normal in size. There is no left ventricular hypertrophy. Right Ventricle: The right ventricular size is normal. No increase in right ventricular wall thickness. Right ventricular systolic function is normal. Left Atrium: Left atrial size was severely dilated. Right Atrium: Right atrial size was severely dilated. Pericardium: There is no evidence of pericardial effusion. Mitral Valve: Post repair with annuloplasty ring no doppler/color flow done. The mitral valve was not assessed. No evidence of mitral valve stenosis. Tricuspid Valve: Post repair with ring no doppler/color flow done. The tricuspid valve is not assessed. Tricuspid valve regurgitation is not demonstrated. No evidence of tricuspid stenosis. Aortic Valve: The aortic valve was not assessed. Aortic valve regurgitation is not visualized. No aortic stenosis is present. Pulmonic Valve: The pulmonic valve was not assessed. Pulmonic valve regurgitation is not visualized. No evidence of pulmonic stenosis. Aorta: The aortic root is normal in size and structure. Venous: The inferior vena cava is normal in size with greater than 50% respiratory variability, suggesting right atrial pressure of 3 mmHg. IAS/Shunts: No atrial level shunt detected by color flow Doppler. LEFT VENTRICLE PLAX 2D LVIDd:         5.35 cm LVIDs:         3.70 cm LV PW:         1.05 cm LV IVS:        0.80 cm  LV Volumes (MOD) LV vol d, MOD A4C: 144.0 ml LV vol s, MOD A4C: 70.3 ml LV SV MOD A4C:     144.0 ml IVC IVC diam: 2.30 cm LEFT ATRIUM              Index        RIGHT ATRIUM           Index LA diam:        5.60 cm  2.40 cm/m   RA Area:     31.60 cm LA Vol (A2C):   109.0 ml 46.66 ml/m  RA Volume:   129.00 ml 55.22 ml/m LA Vol (A4C):   99.5  ml  42.59 ml/m LA Biplane Vol: 105.0 ml 44.94 ml/m   AORTA Ao Root diam: 3.10 cm Ao Asc diam:  3.00 cm Jenkins Rouge MD Electronically signed by Jenkins Rouge MD Signature Date/Time: 03/13/2022/10:21:28 AM    Final    US RENAL  Result Date: 03/12/2022 CLINICAL DATA:  Acute kidney injury. EXAM: RENAL / URINARY TRACT ULTRASOUND COMPLETE COMPARISON:  CT AP 03/08/2022 FINDINGS: Right Kidney: Renal measurements: 9.8 x 5.3 by 5.7 cm = volume: 154.7 mL. Echogenicity within normal limits. No mass or hydronephrosis visualized. Left Kidney: Renal measurements: 10.0 x 5.5 x 5.4 cm = volume: 153.0 mL. Echogenicity within normal limits. No mass or hydronephrosis visualized. Bladder: Appears normal for degree of bladder distention. Other: None. IMPRESSION: Normal renal sonogram. Electronically Signed  By: Kerby Moors M.D.   On: 03/12/2022 12:52   VAS US CAROTID  Result Date: 03/09/2022 Carotid Arterial Duplex Study Patient Name:  CHERILYN SAUTTER  Date of Exam:   03/09/2022 Medical Rec #: [id number]       Accession #:    [id number] Date of Birth: 1955/11/12       Patient Gender: F Patient Age:   64 years Exam Location:  [facility] Procedure:      VAS US CAROTID Referring Phys: Wandra Feinstein RATHORE --------------------------------------------------------------------------------  Risk Factors:      Hypertension, hyperlipidemia, Diabetes. Comparison Study:  12/24/2020 1-39% bilateral ICA stenosis Performing Technologist: Maudry Mayhew MHA, RDMS, RVT, RDCS  Examination Guidelines: A complete evaluation includes B-mode imaging, spectral Doppler, color Doppler, and power Doppler as needed of all accessible portions of each vessel. Bilateral testing is considered an integral part of a complete examination. Limited examinations for reoccurring indications may be performed as noted.  Right Carotid Findings: +----------+--------+--------+--------+--------------------------+--------+           PSV cm/sEDV  cm/sStenosisPlaque Description        Comments +----------+--------+--------+--------+--------------------------+--------+ CCA Prox  120     19                                                 +----------+--------+--------+--------+--------------------------+--------+ CCA Distal84      16                                                 +----------+--------+--------+--------+--------------------------+--------+ ICA Prox  65      19              irregular and heterogenous         +----------+--------+--------+--------+--------------------------+--------+ ICA Distal78      29                                                 +----------+--------+--------+--------+--------------------------+--------+ ECA       87                                                         +----------+--------+--------+--------+--------------------------+--------+ +----------+--------+-------+----------------+-------------------+           PSV cm/sEDV cmsDescribe        Arm Pressure (mmHG) +----------+--------+-------+----------------+-------------------+ [id number]            Multiphasic, WNL                    +----------+--------+-------+----------------+-------------------+ +---------+--------+--+--------+--+---------+ VertebralPSV cm/s68EDV cm/s21Antegrade +---------+--------+--+--------+--+---------+  Left Carotid Findings: +----------+--------+--------+--------+-------------------------------+--------+           PSV cm/sEDV cm/sStenosisPlaque Description             Comments +----------+--------+--------+--------+-------------------------------+--------+ CCA Prox  109     18                                                      +----------+--------+--------+--------+-------------------------------+--------+  CCA Distal89      25                                                       +----------+--------+--------+--------+-------------------------------+--------+ ICA Prox  73      22              heterogenous, focal and                                                   calcific                                +----------+--------+--------+--------+-------------------------------+--------+ ICA Distal76      22                                                      +----------+--------+--------+--------+-------------------------------+--------+ ECA       100     10                                                      +----------+--------+--------+--------+-------------------------------+--------+ +----------+--------+--------+----------------+-------------------+           PSV cm/sEDV cm/sDescribe        Arm Pressure (mmHG) +----------+--------+--------+----------------+-------------------+ [id number]             Multiphasic, WNL                    +----------+--------+--------+----------------+-------------------+ +---------+--------+--+--------+--+---------+ VertebralPSV cm/s78EDV cm/s24Antegrade +---------+--------+--+--------+--+---------+   Summary: Right Carotid: Velocities in the right ICA are consistent with a 1-39% stenosis. Left Carotid: Velocities in the left ICA are consistent with a 1-39% stenosis. Vertebrals:  Bilateral vertebral arteries demonstrate antegrade flow. Subclavians: Normal flow hemodynamics were seen in bilateral subclavian              arteries. *See table(s) above for measurements and observations.  Electronically signed by Jamelle Haring on 03/09/2022 at 8:21:24 PM.    Final    CT Angio Chest/Abd/Pel for Dissection W and/or Wo Contrast  Result Date: 03/08/2022 CLINICAL DATA:  Acute aortic syndrome (AAS) EXAM: CT ANGIOGRAPHY CHEST, ABDOMEN AND PELVIS TECHNIQUE: Non-contrast CT of the chest was initially obtained. Multidetector CT imaging through the chest, abdomen and pelvis was performed using the standard protocol  during bolus administration of intravenous contrast. Multiplanar reconstructed images and MIPs were obtained and reviewed to evaluate the vascular anatomy. RADIATION DOSE REDUCTION: This exam was performed according to the departmental dose-optimization program which includes automated exposure control, adjustment of the mA and/or kV according to patient size and/or use of iterative reconstruction technique. CONTRAST:  124mL OMNIPAQUE IOHEXOL 350 MG/ML SOLN COMPARISON:  CT chest 11/21/2021, CT abdomen pelvis 09/23/2021 FINDINGS: CTA CHEST FINDINGS Cardiovascular: Mitral and tricuspid valve replacement has been performed. There is stable global cardiomegaly. No pericardial effusion. Left atrial clipping has been  performed central pulmonary arteries are enlarged in keeping with changes of pulmonary arterial hypertension, stable since prior examination. The thoracic aorta is normal in course and caliber there is no evidence of intramural hematoma, aneurysm, or dissection. Bovine arch anatomy with wide patency of the arch vasculature proximally. 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 Mediastinum/Nodes: No enlarged mediastinal, hilar, or axillary lymph nodes. Thyroid gland, trachea, and esophagus demonstrate no significant findings. Lungs/Pleura: Lungs are clear. No pleural effusion or pneumothorax. Musculoskeletal: No acute bone abnormality. No lytic or blastic bone lesion. Review of the MIP images confirms the above findings. CTA ABDOMEN AND PELVIS FINDINGS VASCULAR Aorta: Mild atherosclerotic calcification within the abdominal aorta. No aortic aneurysm or dissection. No periaortic inflammatory change. Celiac: Patent without evidence of aneurysm, dissection, vasculitis or significant stenosis. SMA: Patent without evidence of aneurysm, dissection, vasculitis or significant stenosis. Renals: Single renal arteries bilaterally. No evidence  of hemodynamically significant stenosis. Normal vascular morphology. 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 IMA: Patent without evidence of aneurysm, dissection, vasculitis or significant stenosis. Inflow: Patent without evidence of aneurysm, dissection, vasculitis or significant stenosis. Veins: No obvious venous abnormality within the limitations of this arterial phase study. Review of the MIP images confirms the above findings. NON-VASCULAR Hepatobiliary: No focal liver abnormality is seen. Status post cholecystectomy. No biliary dilatation. Pancreas: Unremarkable Spleen: Unremarkable Adrenals/Urinary Tract: Adrenal glands are unremarkable. Kidneys are normal, without renal calculi, focal lesion, or hydronephrosis. Bladder is unremarkable. Stomach/Bowel: Stomach is within normal limits. Appendix appears normal. No evidence of bowel wall thickening, distention, or inflammatory changes. Lymphatic: A shotty gastrohepatic lymph node is identified at axial image # 141/5 measuring 10 mm in short axis diameter, unchanged from prior examination though slightly enlarged since remote prior examination of 03/04/2020, nonspecific. No additional pathologic adenopathy is seen within the abdomen and pelvis. Reproductive: Status post hysterectomy. No adnexal masses. Other: No abdominal wall hernia Musculoskeletal: No acute bone abnormality. No lytic or blastic bone lesion. Review of the MIP images confirms the above findings. IMPRESSION: 1. No evidence of thoracoabdominal aortic aneurysm or dissection. 2. 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 This is incompletely evaluated on this examination. Correlation with carotid  artery duplex sonography or CT arteriography may be helpful for further evaluation. 3. 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 Stable global cardiomegaly. 4. Stable enlargement of the central pulmonary arteries in keeping with changes of pulmonary arterial hypertension. 5. 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 The more proximal demonstrates a bilobed configuration with thrombosis of 1 of the lobes measuring 1.7 x 2.1 x 2.2 cm the second demonstrating broad communication with the feeding vessel measures 1.3 x 1.6 x 1.4 cm in dimension. 6. Shotty gastrohepatic lymph node measuring 10 mm in short axis diameter, unchanged from prior examination though slightly enlarged since remote prior examination of 03/04/2020, nonspecific. Electronically Signed   By: Fidela Salisbury M.D.   On: 03/08/2022 20:02   DG Chest Portable 1 View  Result Date: 03/08/2022 CLINICAL DATA:  Provided history: Chest pain, shortness of breath. History of atrial  fibrillation and congestive heart failure. Prior open heart surgery. EXAM: PORTABLE CHEST 1 VIEW COMPARISON:  Prior chest radiographs 02/12/2022 and earlier. FINDINGS: Prior median sternotomy. Prior left atrial appendage clipping. Cardiomegaly. No appreciable airspace consolidation or pulmonary edema. No evidence of pleural effusion or pneumothorax. No acute bony abnormality identified. IMPRESSION: No evidence of acute cardiopulmonary abnormality. Cardiomegaly. Electronically Signed   By: Kellie Simmering D.O.   On: 03/08/2022 15:23     EKG EKG Interpretation  Date/Time:  Saturday April 01 2022 13:56:34 EDT Ventricular Rate:  80 PR Interval:    QRS Duration: 102 QT Interval:  480 QTC Calculation: 553 R Axis:   141 Text Interpretation: Sinus rhythm Left posterior fascicular block Nonspecific ST abnormality QT prolonged `similar st appearance inf on prior ecg Reconfirmed by Lajean Saver [phone]) on  04/01/2022 6:14:38 PM  Radiology DG Chest 2 View  Result Date: 04/01/2022 CLINICAL DATA:  Shortness of breath, chest pain and lightheadedness. EXAM: CHEST - 2 VIEW COMPARISON:  Chest radiograph March 08, 2022. FINDINGS: Prior median sternotomy with mitral valve annuloplasty and left atrial occlusion device. Unchanged cardiomegaly. No overt pulmonary edema or focal airspace consolidation. No visible pleural effusion or pneumothorax. No acute osseous finding. IMPRESSION: Cardiomegaly without overt pulmonary edema or focal airspace consolidation. Electronically Signed   By: Dahlia Bailiff M.D.   On: 04/01/2022 14:47    Procedures Procedures    Medications Ordered in ED Medications - No data to display  ED Course/ Medical Decision Making/ A&P                           Medical Decision Making Problems Addressed: Acute on chronic diastolic CHF (congestive heart failure) ([place]): chronic illness or injury with exacerbation, progression, or side effects of treatment that poses a threat to life or bodily functions Atypical chest pain: acute illness or injury with systemic symptoms that poses a threat to life or bodily functions Chronic combined systolic and diastolic congestive heart failure ([place]): chronic illness or injury with exacerbation, progression, or side effects of treatment that poses a threat to life or bodily functions ESRD on dialysis [facility]): chronic illness or injury with exacerbation, progression, or side effects of treatment that poses a threat to life or bodily functions  Amount and/or Complexity of Data Reviewed Independent Historian: EMS External Data Reviewed: labs and notes. Labs: ordered. Decision-making details documented in ED Course. Radiology: ordered and independent interpretation performed. Decision-making details documented in ED Course. ECG/medicine tests: ordered and independent interpretation performed. Decision-making details documented in ED Course.  Risk OTC  drugs. Decision regarding hospitalization.   Iv ns. Continuous pulse ox and cardiac monitoring. Labs ordered/sent. Imaging ordered. Diona Fanti was given.   Diff dx includes acs, acute vs chronic chf, pna, etc - dispo decision including potential need for admission considered if trop elev, pulm edema, etc - will get labs and imaging and revisit dispo decision.   Reviewed nursing notes and prior charts for additional history. External reports reviewed. Additional history from:  Cardiac monitor: sinus rhythm, rate 80.  Labs reviewed/interpreted by me - trop normal - after symptoms present for past day, initial and delta trop normal and not increasing felt not c/w acs.  Prior cardiac cath without signif cad.   Xrays reviewed/interpreted by me - no frank edema.   Additional labs reviewed/interpreted by me-  delta trop normal. Bnp c/w prior.   Recheck pt, appears very comfortable no distress. Hr 78. Rr 16, pulse ox  99%. No chest pain. No increased wob. No faintness.   Pt currently appears stable for d/c.   Rec close pcp/cardiology-chf clinic follow up this week.  Return precautions provided.          Final Clinical Impression(s) / ED Diagnoses Final diagnoses:  None    Rx / DC Orders ED Discharge Orders     None         Lajean Saver, MD 04/01/22 2309

## 2022-04-01 NOTE — ED Notes (Signed)
The pt just wants some crabberry juice and saltines

## 2022-04-01 NOTE — Discharge Instructions (Addendum)
It was our pleasure to provide your ER care today - we hope that you feel better.  Drink adequate fluids/stay well hydrated.   Follow up very closely with your doctor/cardiologist this week.   Return to ER right away if worse, new symptoms, fevers, persistent or recurrent chest pain, increased trouble breathing, weak/fainting, or other concern.

## 2022-04-02 NOTE — Telephone Encounter (Signed)
Tried calling patient several times. PCP left a message will continue to try to reach patient

## 2022-04-17 ENCOUNTER — Ambulatory Visit (HOSPITAL_BASED_OUTPATIENT_CLINIC_OR_DEPARTMENT_OTHER): Payer: 59 | Admitting: Nurse Practitioner

## 2022-04-17 ENCOUNTER — Other Ambulatory Visit (INDEPENDENT_AMBULATORY_CARE_PROVIDER_SITE_OTHER): Payer: Self-pay | Admitting: Primary Care

## 2022-04-17 DIAGNOSIS — I5043 Acute on chronic combined systolic (congestive) and diastolic (congestive) heart failure: Secondary | ICD-10-CM

## 2022-04-17 DIAGNOSIS — Z9889 Other specified postprocedural states: Secondary | ICD-10-CM

## 2022-04-17 DIAGNOSIS — G4733 Obstructive sleep apnea (adult) (pediatric): Secondary | ICD-10-CM

## 2022-04-17 DIAGNOSIS — R9431 Abnormal electrocardiogram [ECG] [EKG]: Secondary | ICD-10-CM

## 2022-04-17 DIAGNOSIS — K3184 Gastroparesis: Secondary | ICD-10-CM

## 2022-04-17 DIAGNOSIS — I480 Paroxysmal atrial fibrillation: Secondary | ICD-10-CM

## 2022-04-17 DIAGNOSIS — N186 End stage renal disease: Secondary | ICD-10-CM

## 2022-04-23 ENCOUNTER — Other Ambulatory Visit: Payer: Self-pay | Admitting: Student

## 2022-04-23 ENCOUNTER — Other Ambulatory Visit (INDEPENDENT_AMBULATORY_CARE_PROVIDER_SITE_OTHER): Payer: Self-pay | Admitting: Primary Care

## 2022-04-23 ENCOUNTER — Other Ambulatory Visit: Payer: Self-pay | Admitting: Family Medicine

## 2022-04-23 DIAGNOSIS — J0110 Acute frontal sinusitis, unspecified: Secondary | ICD-10-CM

## 2022-04-25 NOTE — Telephone Encounter (Signed)
Filled 90 day supply 04/03/22 Requested Prescriptions  Pending Prescriptions Disp Refills  . colchicine 0.6 MG tablet [Pharmacy Med Name: COLCHICINE 0.6 MG TABLET] 45 tablet 0    Sig: TAKE 1/2 TABLET BY MOUTH DAILY     Endocrinology:  Gout Agents - colchicine Failed - 04/23/2022 10:32 AM      Failed - Cr in normal range and within 360 days    Creatinine, Ser  Date Value Ref Range Status  04/01/2022 2.16 (H) 0.44 - 1.00 mg/dL Final   Creatinine, Urine  Date Value Ref Range Status  07/29/2021 97.28 mg/dL Final    Comment:    Performed at [facility], [place] [address], [place], [place] [address]         Passed - ALT in normal range and within 360 days    ALT  Date Value Ref Range Status  03/12/2022 21 0 - 44 U/L Final         Passed - AST in normal range and within 360 days    AST  Date Value Ref Range Status  03/12/2022 24 15 - 41 U/L Final         Passed - Valid encounter within last 12 months    Recent Outpatient Visits          3 months ago Acute frontal sinusitis, recurrence not specified   [place], Michelle P, NP   7 months ago Hospital discharge follow-up   [place] Juluis Mire P, NP   9 months ago Chest pain due to myocardial ischemia, unspecified ischemic chest pain type   [facility] [name], NP   11 months ago Gastroesophageal reflux disease without esophagitis   [place] [facility] [name], NP   11 months ago Erroneous encounter - disregard   [place] [name], NP             Passed - CBC within normal limits and completed in the last 12 months    WBC  Date Value Ref Range Status  04/01/2022 6.0 4.0 - 10.5 K/uL Final   RBC  Date Value Ref Range Status  04/01/2022 3.53 (L) 3.87 - 5.11 MIL/uL Final   Hemoglobin  Date Value Ref Range Status  04/01/2022 9.8 (L) 12.0 - 15.0 g/dL Final   04/14/2021 10.5 (L) 11.1 - 15.9 g/dL Final   Total hemoglobin  Date Value Ref Range Status  01/11/2021 9.6 (L) 12.0 - 16.0 g/dL Final   HCT  Date Value Ref Range Status  04/01/2022 32.2 (L) 36.0 - 46.0 % Final   Hematocrit  Date Value Ref Range Status  04/14/2021 32.5 (L) 34.0 - 46.6 % Final   MCHC  Date Value Ref Range Status  04/01/2022 30.4 30.0 - 36.0 g/dL Final   [facility]  Date Value Ref Range Status  04/01/2022 27.8 26.0 - 34.0 pg Final   MCV  Date Value Ref Range Status  04/01/2022 91.2 80.0 - 100.0 fL Final  04/14/2021 85 79 - 97 fL Final   No results found for: "PLTCOUNTKUC", "LABPLAT", "POCPLA" RDW  Date Value Ref Range Status  04/01/2022 21.2 (H) 11.5 - 15.5 % Final  04/14/2021 15.3 11.7 - 15.4 % Final

## 2022-04-25 NOTE — Telephone Encounter (Signed)
Routed to PCP 

## 2022-04-26 ENCOUNTER — Other Ambulatory Visit (HOSPITAL_COMMUNITY): Payer: Self-pay | Admitting: Physician Assistant

## 2022-04-27 ENCOUNTER — Other Ambulatory Visit: Payer: Self-pay | Admitting: *Deleted

## 2022-04-27 DIAGNOSIS — N189 Chronic kidney disease, unspecified: Secondary | ICD-10-CM

## 2022-05-01 ENCOUNTER — Encounter (HOSPITAL_BASED_OUTPATIENT_CLINIC_OR_DEPARTMENT_OTHER): Payer: Self-pay | Admitting: Cardiology

## 2022-05-01 ENCOUNTER — Ambulatory Visit (INDEPENDENT_AMBULATORY_CARE_PROVIDER_SITE_OTHER): Payer: 59 | Admitting: Cardiology

## 2022-05-01 VITALS — BP 113/63 | HR 83 | Ht 67.0 in | Wt 286.4 lb

## 2022-05-01 DIAGNOSIS — I4821 Permanent atrial fibrillation: Secondary | ICD-10-CM

## 2022-05-01 DIAGNOSIS — I428 Other cardiomyopathies: Secondary | ICD-10-CM | POA: Diagnosis not present

## 2022-05-01 DIAGNOSIS — Z992 Dependence on renal dialysis: Secondary | ICD-10-CM

## 2022-05-01 DIAGNOSIS — I5042 Chronic combined systolic (congestive) and diastolic (congestive) heart failure: Secondary | ICD-10-CM

## 2022-05-01 DIAGNOSIS — N186 End stage renal disease: Secondary | ICD-10-CM | POA: Diagnosis not present

## 2022-05-01 DIAGNOSIS — I480 Paroxysmal atrial fibrillation: Secondary | ICD-10-CM | POA: Diagnosis not present

## 2022-05-01 DIAGNOSIS — Z9889 Other specified postprocedural states: Secondary | ICD-10-CM

## 2022-05-01 NOTE — Patient Instructions (Signed)
Medication Instructions:  Your physician recommends that you continue on your current medications as directed. Please refer to the Current Medication list given to you today.  *If you need a refill on your cardiac medications before your next appointment, please call your pharmacy*  Lab Work: NONE  Testing/Procedures: NONE  Follow-Up: At [facility], you and your health needs are our priority.  As part of our continuing mission to provide you with exceptional heart care, we have created designated Provider Care Teams.  These Care Teams include your primary Cardiologist (physician) and Advanced Practice Providers (APPs -  Physician Assistants and Nurse Practitioners) who all work together to provide you with the care you need, when you need it.  We recommend signing up for the patient portal called "MyChart".  Sign up information is provided on this After Visit Summary.  MyChart is used to connect with patients for Virtual Visits (Telemedicine).  Patients are able to view lab/test results, encounter notes, upcoming appointments, etc.  Non-urgent messages can be sent to your provider as well.   To learn more about what you can do with MyChart, go to [email or web address].    Your next appointment:   3 month(s)  The format for your next appointment:   In Person  Provider:   Bridgette Christopher, MD     

## 2022-05-01 NOTE — Progress Notes (Signed)
Cardiology Office Note:    Date:  05/01/2022   ID:  [name], DOB Mar 20, 1956, MRN [id number]  PCP:  [name], NP  Cardiologist:  Buford Dresser, MD  Referring MD: Lajean Saver, MD   CC: follow up  History of Present Illness:    [name] is a 66 y.o. female with a hx of chronic systolic and diastolic congestive heart failure, hypertension, hyperlipidemia, diabetes mellitus, deep vein thrombosis, COPD, asthma, sleep apnea, obesity, and depression, who is seen as a new consult at the request of Lajean Saver, MD for the evaluation and management of chronic congestive heart failure.  She has had a long and complex cardiac course. She has frequent readmissions for heart failure, chest pain, and shortness of breath.  Today:  She states she is still experiencing shortness of breath and chest pain. She becomes short of breath especially when walking. She states that she thinks the shortness of breath has improved only slightly since starting dialysis. She has not noticed any pattern with when she experiences chest pain. Her chest pain typically occurs every day and gives her a sharp, pressure sensation.   She still experiences fatigue, which occurs at random but is fairly frequent.  She will sometimes have a productive cough. She states that she experienced that cough this morning.  She reports that her blood pressure keeps decreasing.    She has not had much of an appetite recently.  She denies any palpitations or peripheral edema. No lightheadedness, headaches, syncope, orthopnea, or PND.   Past Medical History:  Diagnosis Date   Acute on chronic systolic CHF (congestive heart failure) (HCC) 12/21/2020   Allergic rhinitis    Arthritis    Asthma    Chronic diastolic CHF (congestive heart failure) (HCC)    COPD (chronic obstructive pulmonary disease) (HCC)    Depression    DM (diabetes mellitus) ([place])    DVT (deep vein thrombosis) in pregnancy    GERD  (gastroesophageal reflux disease)    HTN (hypertension)    Hyperlipidemia    Obesity    Pneumonia 04/2018   RIGHT LOBE   Sleep apnea    compliant with CPAP    Past Surgical History:  Procedure Laterality Date   ABDOMINAL HYSTERECTOMY     AV FISTULA PLACEMENT Left 03/17/2022   Procedure: LEFT ARM ARTERIOVENOUS (AV) FISTULA CREATION;  Surgeon: Angelia Mould, MD;  Location: [place];  Service: Vascular;  Laterality: Left;   BUBBLE STUDY  11/26/2020   Procedure: BUBBLE STUDY;  Surgeon: Buford Dresser, MD;  Location: [place];  Service: Cardiovascular;;   CARDIOVERSION N/A 05/06/2020   Procedure: CARDIOVERSION;  Surgeon: Buford Dresser, MD;  Location: [place];  Service: Cardiovascular;  Laterality: N/A;   CARDIOVERSION N/A 11/04/2020   Procedure: CARDIOVERSION;  Surgeon: Lelon Perla, MD;  Location: [place];  Service: Cardiovascular;  Laterality: N/A;   CARDIOVERSION N/A 02/01/2021   Procedure: CARDIOVERSION;  Surgeon: Jolaine Artist, MD;  Location: [place];  Service: Cardiovascular;  Laterality: N/A;   CARDIOVERSION N/A 02/09/2021   Procedure: CARDIOVERSION;  Surgeon: Jolaine Artist, MD;  Location: [place];  Service: Cardiovascular;  Laterality: N/A;   CARDIOVERSION N/A 04/19/2021   Procedure: CARDIOVERSION;  Surgeon: Fay Records, MD;  Location: [place];  Service: Cardiovascular;  Laterality: N/A;   CARDIOVERSION N/A 11/25/2021   Procedure: CARDIOVERSION;  Surgeon: Jolaine Artist, MD;  Location: [facility] ENDOSCOPY;  Service: Cardiovascular;  Laterality: N/A;   CARDIOVERSION N/A 01/31/2022  Procedure: CARDIOVERSION;  Surgeon: Jolaine Artist, MD;  Location: [facility] ENDOSCOPY;  Service: Cardiovascular;  Laterality: N/A;   CATARACT EXTRACTION, BILATERAL     CHOLECYSTECTOMY     COLONOSCOPY WITH PROPOFOL N/A 03/05/2015   Procedure: COLONOSCOPY WITH PROPOFOL;  Surgeon: Carol Ada, MD;  Location: [facility] ENDOSCOPY;  Service: Endoscopy;   Laterality: N/A;   DIAGNOSTIC LAPAROSCOPY     FISTULA SUPERFICIALIZATION Left 03/17/2022   Procedure: FISTULA SUPERFICIALIZATION -LEFT;  Surgeon: Angelia Mould, MD;  Location: [facility] OR;  Service: Vascular;  Laterality: Left;   ingrown hallux Left    IR FLUORO GUIDE CV LINE RIGHT  03/14/2022   IR US GUIDE VASC ACCESS RIGHT  03/14/2022   KNEE SURGERY     LEFT HEART CATH AND CORONARY ANGIOGRAPHY N/A 12/31/2017   Procedure: LEFT HEART CATH AND CORONARY ANGIOGRAPHY;  Surgeon: Charolette Forward, MD;  Location: [place] CV LAB;  Service: Cardiovascular;  Laterality: N/A;   MAZE N/A 12/29/2020   Procedure: MAZE;  Surgeon: [name], MD;  Location: [place];  Service: Open Heart Surgery;  Laterality: N/A;   MITRAL VALVE REPAIR N/A 12/29/2020   Procedure: MITRAL VALVE REPAIR USING CARBOMEDICS ANNULOFLEX RING SIZE 30;  Surgeon: [name], MD;  Location: [place];  Service: Open Heart Surgery;  Laterality: N/A;   RIGHT HEART CATH N/A 12/22/2020   Procedure: RIGHT HEART CATH;  Surgeon: Larey Dresser, MD;  Location: [place] CV LAB;  Service: Cardiovascular;  Laterality: N/A;   RIGHT HEART CATH N/A 01/31/2021   Procedure: RIGHT HEART CATH;  Surgeon: Jolaine Artist, MD;  Location: [place] CV LAB;  Service: Cardiovascular;  Laterality: N/A;   RIGHT HEART CATH N/A 07/29/2021   Procedure: RIGHT HEART CATH;  Surgeon: Jolaine Artist, MD;  Location: [place] CV LAB;  Service: Cardiovascular;  Laterality: N/A;   RIGHT HEART CATH N/A 11/24/2021   Procedure: RIGHT HEART CATH;  Surgeon: Jolaine Artist, MD;  Location: [place] CV LAB;  Service: Cardiovascular;  Laterality: N/A;   RIGHT HEART CATH N/A 02/14/2022   Procedure: RIGHT HEART CATH;  Surgeon: Jolaine Artist, MD;  Location: [place] CV LAB;  Service: Cardiovascular;  Laterality: N/A;   RIGHT HEART CATH N/A 02/22/2022   Procedure: RIGHT HEART CATH;  Surgeon: Larey Dresser, MD;  Location: [place] CV LAB;   Service: Cardiovascular;  Laterality: N/A;   TEE WITHOUT CARDIOVERSION N/A 11/26/2020   Procedure: TRANSESOPHAGEAL ECHOCARDIOGRAM (TEE);  Surgeon: Buford Dresser, MD;  Location: [facility] ENDOSCOPY;  Service: Cardiovascular;  Laterality: N/A;   TEE WITHOUT CARDIOVERSION N/A 12/29/2020   Procedure: TRANSESOPHAGEAL ECHOCARDIOGRAM (TEE);  Surgeon: [name], MD;  Location: [place];  Service: Open Heart Surgery;  Laterality: N/A;   TEE WITHOUT CARDIOVERSION N/A 01/20/2022   Procedure: TRANSESOPHAGEAL ECHOCARDIOGRAM (TEE);  Surgeon: Jolaine Artist, MD;  Location: [place];  Service: Cardiovascular;  Laterality: N/A;   TRICUSPID VALVE REPLACEMENT N/A 12/29/2020   Procedure: 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 RING SIZE 34;  Surgeon: [name], MD;  Location: [place];  Service: Open Heart Surgery;  Laterality: N/A;    Current Medications: Current Outpatient Medications on File Prior to Visit  Medication Sig   acetaminophen (TYLENOL) 325 MG tablet Take 2 tablets (650 mg total) by mouth every 6 (six) hours as needed for mild pain or moderate pain.   albuterol (VENTOLIN HFA) 108 (90 Base) MCG/ACT inhaler INHALE 1-2 PUFFS BY MOUTH EVERY 6 HOURS AS  NEEDED FOR WHEEZE OR SHORTNESS OF BREATH   amiodarone (PACERONE) 200 MG tablet TAKE 1 TABLET BY MOUTH TWICE A DAY   apixaban (ELIQUIS) 5 MG TABS tablet TAKE ONE TABLET BY MOUTH TWICE DAILY at [place] (Patient taking differently: Take 5 mg by mouth in the morning and at bedtime.)   busPIRone (BUSPAR) 5 MG tablet TAKE 1 TABLET BY MOUTH TWICE A DAY (Patient taking differently: Take 5 mg by mouth 2 (two) times daily.)   colchicine 0.6 MG tablet TAKE 0.5 TABLETS BY MOUTH DAILY.   diphenhydrAMINE (BENADRYL) 25 mg capsule Take 25 mg by mouth every 6 (six) hours as needed for allergies or itching.   DULoxetine (CYMBALTA) 60 MG capsule TAKE 1 CAPSULE BY MOUTH EVERY DAY (Patient taking differently: Take 60 mg by mouth daily.)    EPINEPHrine 0.3 mg/0.3 mL IJ SOAJ injection Inject 0.3 mg into the muscle as needed for anaphylaxis.   fluticasone (FLONASE) 50 MCG/ACT nasal spray Place 2 sprays into both nostrils daily as needed for allergies or rhinitis.   glucose blood (ACCU-CHEK GUIDE) test strip USE TO CHECK BLOOD SUGAR 3 TIMES DAILY   insulin aspart (NOVOLOG) 100 UNIT/ML injection Inject 2-10 Units into the skin 3 (three) times daily before meals. Per sliding scale (Patient taking differently: Inject 2-10 Units into the skin See admin instructions. Inject 2-10 units into the skin three times a day before meals, per sliding scale)   isosorbide mononitrate (IMDUR) 30 MG 24 hr tablet Take 0.5 tablets (15 mg total) by mouth daily.   lidocaine (XYLOCAINE) 5 % ointment Apply 1 application. topically as needed for moderate pain. First choice is lidocaine patch, but if not covered/expensive, please fill ointment (Patient taking differently: Apply 1 application  topically 2 (two) times daily as needed for moderate pain.)   LINZESS 72 MCG capsule TAKE 1 CAPSULE (72 MCG TOTAL) BY MOUTH DAILY BEFORE BREAKFAST. IF BM NOT ACHIEVE WITH 1 CAPSULE SHE MAY TAKE 2 DAILY. STOP IF DIARRHEA OCCURS   loratadine (CLARITIN) 10 MG tablet Take 1 tablet (10 mg total) by mouth daily.   metoprolol succinate (TOPROL-XL) 25 MG 24 hr tablet Take 1 tablet (25 mg total) by mouth daily.   Multiple Vitamins-Minerals (MULTIVITAMIN WOMEN PO) Take 1 tablet by mouth daily.   nitroGLYCERIN (NITROSTAT) 0.4 MG SL tablet Place 1 tablet (0.4 mg total) under the tongue every 5 (five) minutes x 3 doses as needed for chest pain.   pantoprazole (PROTONIX) 40 MG tablet TAKE ONE TABLET BY MOUTH DAILY AT 9AM   polyethylene glycol powder (GLYCOLAX/MIRALAX) 17 GM/SCOOP powder Take 17 g by mouth daily. (Patient taking differently: Take 17 g by mouth daily as needed for mild constipation.)   pregabalin (LYRICA) 100 MG capsule TAKE 1 CAPSULE BY MOUTH THREE TIMES A DAY (Patient taking  differently: Take 100 mg by mouth 3 (three) times daily as needed (for pain).)   RESTASIS 0.05 % ophthalmic emulsion Place 1 drop into both eyes in the morning and at bedtime.   rosuvastatin (CRESTOR) 10 MG tablet Take 1 tablet (10 mg total) by mouth every evening. (Patient taking differently: Take 10 mg by mouth daily.)   senna-docusate (SENOKOT-S) 8.6-50 MG tablet Take 1 tablet by mouth 2 (two) times daily.   TRULICITY 1.5 LF/8.1OF SOPN INJECT 1.5 MG INTO THE SKIN ONCE A WEEK. (Patient taking differently: Inject 1.5 mg into the skin every Wednesday.)   No current facility-administered medications on file prior to visit.     Allergies:  Bee pollen, Bee venom, and Sulfa antibiotics   Social History   Tobacco Use   Smoking status: Never   Smokeless tobacco: Never  Vaping Use   Vaping Use: Never used  Substance Use Topics   Alcohol use: No   Drug use: No    Family History: family history includes Breast cancer in her mother; CAD in an other family member; Cancer in her father and mother; Hypertension in her brother, mother, and sister.  ROS:   Please see the history of present illness.   (+) Shortness of breath (+) Chest pain (+) Productive cough (+) Fatigue (+) Minimal appetite   Additional pertinent ROS otherwise unremarkable.    EKGs/Labs/Other Studies Reviewed:    The following studies were reviewed today:  Echo 03/13/2022: 1. Abnormal septal motion . Left ventricular ejection fraction, by  estimation, is 50 to 55%. The left ventricle has low normal function. The  left ventricle has no regional wall motion abnormalities.   2. Right ventricular systolic function is normal. The right ventricular  size is normal.   3. Left atrial size was severely dilated.   4. Right atrial size was severely dilated.   5. Post repair with annuloplasty ring no doppler/color flow done. The  mitral valve was not assessed. No evidence of mitral valve regurgitation.  No evidence of mitral  stenosis.   6. Post repair with ring no doppler/color flow done. Tricuspid valve  regurgitation is not demonstrated.   7. The aortic valve was not assessed. Aortic valve regurgitation is not  visualized. No aortic stenosis is present.   8. The inferior vena cava is normal in size with greater than 50%  respiratory variability, suggesting right atrial pressure of 3 mmHg.    Bilateral Carotid Duplex 03/09/2022: Summary:  Right Carotid: Velocities in the right ICA are consistent with a 1-39%  stenosis.   Left Carotid: Velocities in the left ICA are consistent with a 1-39%  stenosis.   Vertebrals:  Bilateral vertebral arteries demonstrate antegrade flow.  Subclavians: Normal flow hemodynamics were seen in bilateral subclavian arteries.    Right Heart Cath 02/22/2022: 1. Normal PCWP 2. Mild pulmonary venous hypertension.  3. Mildly elevated RA pressure.  4. Preserved cardiac output.    EKG:  EKG is personally reviewed.   05/01/22: Sinus rhythm. Rate 83 bpm.  PAC, iRBBB, PRWP  Recent Labs: 11/22/2021: TSH 2.224 03/12/2022: ALT 21; Magnesium 2.0 04/01/2022: B Natriuretic Peptide 222.7; BUN 7; Creatinine, Ser 2.16; Hemoglobin 9.8; Platelets 185; Potassium 3.3; Sodium 138  Recent Lipid Panel    Component Value Date/Time   CHOL 158 05/03/2021 0603   CHOL 140 06/24/2020 1054   TRIG 83 05/03/2021 0603   HDL 50 05/03/2021 0603   HDL 46 06/24/2020 1054   CHOLHDL 3.2 05/03/2021 0603   VLDL 17 05/03/2021 0603   LDLCALC 91 05/03/2021 0603   LDLCALC 75 06/24/2020 1054    Physical Exam:    VS:  BP 113/63 (BP Location: Left Arm, Patient Position: Sitting, Cuff Size: Normal)   Pulse 83   Ht '5\' 7"'$  (1.702 m)   Wt 286 lb 6.4 oz (129.9 kg)   BMI 44.86 kg/m     Wt Readings from Last 3 Encounters:  05/18/22 286 lb 9.6 oz (130 kg)  05/11/22 286 lb 12.8 oz (130.1 kg)  05/04/22 283 lb 9.6 oz (128.6 kg)    GEN: Well nourished, well developed in no acute distress HEENT: Normal, moist mucous  membranes NECK: No JVD CARDIAC: regular  rhythm, normal S1 and S2, no rubs or gallops. No murmur. VASCULAR: Radial and DP pulses 2+ bilaterally. No carotid bruits RESPIRATORY:  Clear to auscultation without rales, wheezing or rhonchi  ABDOMEN: Soft, non-tender, non-distended MUSCULOSKELETAL:  Ambulates independently SKIN: Warm and dry, no edema NEUROLOGIC:  Alert and oriented x 3. No focal neuro deficits noted. PSYCHIATRIC:  Normal affect    ASSESSMENT:    1. Chronic combined systolic and diastolic heart failure (HCC)   2. Paroxysmal atrial fibrillation ([place])   3. ESRD (end stage renal disease) on dialysis ([place])   4. NICM (nonischemic cardiomyopathy) ([place])   5. H/O mitral valve repair   6. History of tricuspid valve repair    PLAN:    Atypical chest pain -not exertional, random -prior workup unremarkable -not likely cardiac in origin  atrial fibrillation, paroxysmal Atypical atrial flutter -in sinus today -CHA2DS2/VAS Stroke Risk Points=4, continue DOAC -has tried tikosyn, had MAZE and multiple cardioversions -continue metoprolol, amiodarone.    S/P MVR and TVR, with post op mitral stenosis chronic systolic and diastolic heart failure, last EF improved Nonischemic cardiomyopathy Nonobstructive CAD Type II diabetes -following closely with heart failure clinic -NYHA 3-4 -last EF improved to 50-55%, prior 35-40% -now on dialysis -on metoprolol succinate, trulicity -no aspirin when on DOAC -continue statin   History of obstructive sleep apnea, with CPAP at home: -previously ordered repeat sleep study, not completed   Strong family history of heart disease/heart failure: she is unclear of the etiology of this   Cardiac risk counseling and prevention recommendations: -recommend heart healthy/Mediterranean diet, with whole grains, fruits, vegetable, fish, lean meats, nuts, and olive oil. Limit salt. -recommend moderate walking, 3-5 times/week for 30-50 minutes each  session. Aim for at least 150 minutes.week. Goal should be pace of 3 miles/hours, or walking 1.5 miles in 30 minutes -recommend avoidance of tobacco products. Avoid excess alcohol. -ASCVD risk score: The 10-year ASCVD risk score (Arnett DK, et al., 2019) is: 13.5%   Values used to calculate the score:     Age: 28 years     Sex: Female     Is Non-Hispanic African American: Yes     Diabetic: Yes     Tobacco smoker: No     Systolic Blood Pressure: 606 mmHg     Is BP treated: Yes     HDL Cholesterol: 50 mg/dL     Total Cholesterol: 158 mg/dL    Plan for follow up: 3 months.  Buford Dresser, MD, PhD, [facility]    Medication Adjustments/Labs and Tests Ordered: Current medicines are reviewed at length with the patient today.  Concerns regarding medicines are outlined above.  Orders Placed This Encounter  Procedures   EKG 12-Lead   No orders of the defined types were placed in this encounter.   Patient Instructions  Medication Instructions:  Your physician recommends that you continue on your current medications as directed. Please refer to the Current Medication list given to you today.   *If you need a refill on your cardiac medications before your next appointment, please call your pharmacy*  Lab Work: NONE   Testing/Procedures: NONE   Follow-Up: At [facility], you and your health needs are our priority.  As part of our continuing mission to provide you with exceptional heart care, we have created designated Provider Care Teams.  These Care Teams include your primary Cardiologist (physician) and Advanced Practice Providers (APPs -  Physician Assistants and Nurse Practitioners) who all  work together to provide you with the care you need, when you need it.  We recommend signing up for the patient portal called "MyChart".  Sign up information is provided on this After Visit Summary.  MyChart is used to connect with patients for Virtual  Visits (Telemedicine).  Patients are able to view lab/test results, encounter notes, upcoming appointments, etc.  Non-urgent messages can be sent to your provider as well.   To learn more about what you can do with MyChart, go to [email or web address].    Your next appointment:   3 month(s)  The format for your next appointment:   In Person  Provider:   Buford Dresser, MD                 I,Breanna Adamick,acting as a scribe for Buford Dresser, MD.,have documented all relevant documentation on the behalf of Buford Dresser, MD,as directed by  Buford Dresser, MD while in the presence of Buford Dresser, MD.   I, Buford Dresser, MD, have reviewed all documentation for this visit. The documentation on 05/21/22 for the exam, diagnosis, procedures, and orders are all accurate and complete.   Signed, Buford Dresser, MD PhD 05/01/2022     [place]

## 2022-05-04 ENCOUNTER — Ambulatory Visit (HOSPITAL_COMMUNITY)
Admission: RE | Admit: 2022-05-04 | Discharge: 2022-05-04 | Disposition: A | Discharge status: Home / Self Care | Payer: 59 | Source: Ambulatory Visit | Attending: Vascular Surgery | Admitting: Vascular Surgery

## 2022-05-04 ENCOUNTER — Ambulatory Visit (INDEPENDENT_AMBULATORY_CARE_PROVIDER_SITE_OTHER): Payer: 59 | Admitting: Physician Assistant

## 2022-05-04 ENCOUNTER — Other Ambulatory Visit (INDEPENDENT_AMBULATORY_CARE_PROVIDER_SITE_OTHER): Payer: Self-pay | Admitting: Primary Care

## 2022-05-04 VITALS — BP 123/71 | HR 82 | Temp 97.9°F | Resp 20 | Ht 67.0 in | Wt 283.6 lb

## 2022-05-04 DIAGNOSIS — E1142 Type 2 diabetes mellitus with diabetic polyneuropathy: Secondary | ICD-10-CM

## 2022-05-04 DIAGNOSIS — N189 Chronic kidney disease, unspecified: Secondary | ICD-10-CM | POA: Insufficient documentation

## 2022-05-04 DIAGNOSIS — F32A Depression, unspecified: Secondary | ICD-10-CM

## 2022-05-04 DIAGNOSIS — Z992 Dependence on renal dialysis: Secondary | ICD-10-CM

## 2022-05-04 DIAGNOSIS — N186 End stage renal disease: Secondary | ICD-10-CM

## 2022-05-04 DIAGNOSIS — Z760 Encounter for issue of repeat prescription: Secondary | ICD-10-CM

## 2022-05-04 NOTE — Telephone Encounter (Signed)
Medication Refill - Medication: DULoxetine (CYMBALTA) 60 MG capsule    Has the patient contacted their pharmacy? Yes.   Pharmacy calling to request medication.   (Agent: If yes, when and what did the pharmacy advise?)  Preferred Pharmacy (with phone number or street name):  [facility] - [place], [place] - [address] [place] [address]  Phone: [phone] Fax: [phone]  Hours: Not open 24 hours   Has the patient been seen for an appointment in the last year OR does the patient have an upcoming appointment? No.  Agent: Please be advised that RX refills may take up to 3 business days. We ask that you follow-up with your pharmacy.

## 2022-05-04 NOTE — Progress Notes (Signed)
POST OPERATIVE OFFICE NOTE    CC:  F/u for surgery  HPI:  This is a 66 y.o. female who is s/p  Left brachiocephalic fistula (superficialization by transposition of cephalic vein)  TDC placed by IR.  03/14/22.  She denise pain, weakness and loss of sensation.  No symptoms of steal.  She has HD via TDC.    Pt returns today for follow up.    Allergies  Allergen Reactions   Bee Pollen Anaphylaxis and Other (See Comments)    UNCONFIRMED, but IS allergic to bee VENOM   Bee Venom Anaphylaxis   Sulfa Antibiotics Itching    Current Outpatient Medications  Medication Sig Dispense Refill   acetaminophen (TYLENOL) 325 MG tablet Take 2 tablets (650 mg total) by mouth every 6 (six) hours as needed for mild pain or moderate pain.     albuterol (VENTOLIN HFA) 108 (90 Base) MCG/ACT inhaler INHALE 1-2 PUFFS BY MOUTH EVERY 6 HOURS AS NEEDED FOR WHEEZE OR SHORTNESS OF BREATH 6.7 each 1   amiodarone (PACERONE) 200 MG tablet TAKE 1 TABLET BY MOUTH TWICE A DAY 180 tablet 3   apixaban (ELIQUIS) 5 MG TABS tablet TAKE ONE TABLET BY MOUTH TWICE DAILY at [place] (Patient taking differently: Take 5 mg by mouth in the morning and at bedtime.) 60 tablet 1   busPIRone (BUSPAR) 5 MG tablet TAKE 1 TABLET BY MOUTH TWICE A DAY (Patient taking differently: Take 5 mg by mouth 2 (two) times daily.) 180 tablet 1   colchicine 0.6 MG tablet TAKE 0.5 TABLETS BY MOUTH DAILY. 45 tablet 0   diphenhydrAMINE (BENADRYL) 25 mg capsule Take 25 mg by mouth every 6 (six) hours as needed for allergies or itching.     DULoxetine (CYMBALTA) 60 MG capsule TAKE 1 CAPSULE BY MOUTH EVERY DAY (Patient taking differently: Take 60 mg by mouth daily.) 90 capsule 3   EPINEPHrine 0.3 mg/0.3 mL IJ SOAJ injection Inject 0.3 mg into the muscle as needed for anaphylaxis. 1 each 1   fluticasone (FLONASE) 50 MCG/ACT nasal spray Place 2 sprays into both nostrils daily as needed for allergies or rhinitis. 16 g 3   glucose blood (ACCU-CHEK GUIDE) test  strip USE TO CHECK BLOOD SUGAR 3 TIMES DAILY 100 strip 3   insulin aspart (NOVOLOG) 100 UNIT/ML injection Inject 2-10 Units into the skin 3 (three) times daily before meals. Per sliding scale (Patient taking differently: Inject 2-10 Units into the skin See admin instructions. Inject 2-10 units into the skin three times a day before meals, per sliding scale)     lidocaine (XYLOCAINE) 5 % ointment Apply 1 application. topically as needed for moderate pain. First choice is lidocaine patch, but if not covered/expensive, please fill ointment (Patient taking differently: Apply 1 application  topically 2 (two) times daily as needed for moderate pain.) 35.44 g 3   LINZESS 72 MCG capsule TAKE 1 CAPSULE (72 MCG TOTAL) BY MOUTH DAILY BEFORE BREAKFAST. IF BM NOT ACHIEVE WITH 1 CAPSULE SHE MAY TAKE 2 DAILY. STOP IF DIARRHEA OCCURS 60 capsule 1   loratadine (CLARITIN) 10 MG tablet Take 1 tablet (10 mg total) by mouth daily. 30 tablet 11   Multiple Vitamins-Minerals (MULTIVITAMIN WOMEN PO) Take 1 tablet by mouth daily.     nitroGLYCERIN (NITROSTAT) 0.4 MG SL tablet Place 1 tablet (0.4 mg total) under the tongue every 5 (five) minutes x 3 doses as needed for chest pain. 25 tablet 12   pantoprazole (PROTONIX) 40 MG tablet TAKE  ONE TABLET BY MOUTH DAILY AT 9AM 90 tablet 11   polyethylene glycol powder (GLYCOLAX/MIRALAX) 17 GM/SCOOP powder Take 17 g by mouth daily. (Patient taking differently: Take 17 g by mouth daily as needed for mild constipation.) 238 g 0   pregabalin (LYRICA) 100 MG capsule TAKE 1 CAPSULE BY MOUTH THREE TIMES A DAY (Patient taking differently: Take 100 mg by mouth 3 (three) times daily as needed (for pain).) 90 capsule 3   RESTASIS 0.05 % ophthalmic emulsion Place 1 drop into both eyes in the morning and at bedtime.  12   rosuvastatin (CRESTOR) 10 MG tablet Take 1 tablet (10 mg total) by mouth every evening. (Patient taking differently: Take 10 mg by mouth daily.)     senna-docusate (SENOKOT-S) 8.6-50  MG tablet Take 1 tablet by mouth 2 (two) times daily. 14 tablet 0   torsemide (DEMADEX) 100 MG tablet Take 1 tablet by mouth. Monday Wednesday and Friday     TRULICITY 1.5 IT/2.5QD SOPN INJECT 1.5 MG INTO THE SKIN ONCE A WEEK. (Patient taking differently: Inject 1.5 mg into the skin every Wednesday.) 0.5 mL 2   isosorbide mononitrate (IMDUR) 30 MG 24 hr tablet Take 0.5 tablets (15 mg total) by mouth daily. 15 tablet 0   metoprolol succinate (TOPROL-XL) 25 MG 24 hr tablet Take 1 tablet (25 mg total) by mouth daily. 30 tablet 0   No current facility-administered medications for this visit.     ROS:  See HPI  Physical Exam:    Incision:  well healed incisions, visible fistula with good palpable thrill throughout. Extremities:  Palpable radial pulse left UE, sensation intact with 5/5 grip. Lungs non labored breathing    Assessment/Plan:  This is a 66 y.o. female who is s/p:Left brachiocephalic fistula (superficialization by transposition of cephalic vein)  The fistula will be ready to access as of 05/28/22.  Once it is working well she will be scheduled for [facility] removal by IR.  F/U with [facility] as needed.   Roxy Horseman PA-C [facility] [phone]   Call MD:  Stanford Breed

## 2022-05-05 NOTE — Telephone Encounter (Signed)
Will forward to provider  

## 2022-05-05 NOTE — Telephone Encounter (Signed)
Requested medication (s) are due for refill today:   Not sure  Requested medication (s) are on the active medication list:   Yes  Future visit scheduled:   No   Last ordered: 02/26/2022 #90, 3 refills  Returned because no dispensing amount or refills listed.     Requested Prescriptions  Pending Prescriptions Disp Refills   DULoxetine (CYMBALTA) 60 MG capsule      Sig: Take by mouth daily.     Psychiatry: Antidepressants - SNRI - duloxetine Failed - 05/04/2022  2:12 PM      Failed - Cr in normal range and within 360 days    Creatinine, Ser  Date Value Ref Range Status  04/01/2022 2.16 (H) 0.44 - 1.00 mg/dL Final   Creatinine, Urine  Date Value Ref Range Status  07/29/2021 97.28 mg/dL Final    Comment:    Performed at [facility], [place] [address], [place], [place] [address]         Failed - eGFR is 30 or above and within 360 days    GFR calc Af Amer  Date Value Ref Range Status  09/08/2020 33 (L) >59 mL/min/1.73 Final    Comment:    **In accordance with recommendations from the NKF-ASN Task force,**   [facility] is in the process of updating its eGFR calculation to the   2021 CKD-EPI creatinine equation that estimates kidney function   without a race variable.    GFR, Estimated  Date Value Ref Range Status  04/01/2022 25 (L) >60 mL/min Final    Comment:    (NOTE) Calculated using the CKD-EPI Creatinine Equation (2021)    eGFR  Date Value Ref Range Status  01/11/2022 23 (L) >59 mL/min/1.73 Final         Passed - Completed PHQ-2 or PHQ-9 in the last 360 days      Passed - Last BP in normal range    BP Readings from Last 1 Encounters:  05/04/22 123/71         Passed - Valid encounter within last 6 months    Recent Outpatient Visits           4 months ago Acute frontal sinusitis, recurrence not specified   [place], Michelle P, NP   7 months ago Hospital discharge follow-up   [place] Jul[place] Mire P, NP   9 months ago Chest pain due to myocardial ischemia, unspecified ischemic chest pain type   [place], Michelle P, NP   11 months ago Gastroesophageal reflux disease without esophagitis   [facility] [name], NP   1 year ago Erroneous encounter - disregard   [place]at Momoli, [place], NP       Future Appointments             In 2 months Buford Dresser, MD [facility]

## 2022-05-08 MED ORDER — DULOXETINE HCL 60 MG PO CPEP: 60.0000 mg | ORAL_CAPSULE | Freq: Every day | ORAL | 0 refills | Status: DC

## 2022-05-09 ENCOUNTER — Ambulatory Visit (INDEPENDENT_AMBULATORY_CARE_PROVIDER_SITE_OTHER): Payer: Self-pay

## 2022-05-09 NOTE — Telephone Encounter (Signed)
  Chief Complaint: hand pain Symptoms: bilat hand pain in fingers to wrist, 9/10, constant  Frequency: yesterday  Pertinent Negatives: Patient denies swelling or redness  Disposition: '[]'$ ED /'[]'$ Urgent Care (no appt availability in office) / '[x]'$ Appointment(In office/virtual)/ '[]'$  [facility]/ '[]'$ Home Care/ '[]'$ Refused Recommended Disposition /'[]'$ [facility]/ '[]'$  Follow-up with PCP Additional Notes: pt states nothing makes pain better or worse. Pt preferred to see [name], NP vs going to UC. Scheduled first available on 05/11/22 at 1610. Advised pt to call back if pain gets any worse and can schedule UC appt.   Reason for Disposition  [1] SEVERE pain (e.g., excruciating, unable to use hand at all) AND [2] not improved after 2 hours of pain medicine  Answer Assessment - Initial Assessment Questions 1. ONSET: "When did the pain start? Yesterday    2. LOCATION: "Where is the pain located?"     Both hands, fingers and palms, and into wrist   3. PAIN: "How bad is the pain?" (Scale 1-10; or mild, moderate, severe)   - MILD (1-3): doesn't interfere with normal activities   - MODERATE (4-7): interferes with normal activities (e.g., work or school) or awakens from sleep   - SEVERE (8-10): excruciating pain, unable to use hand at all     9/10 7. OTHER SYMPTOMS: "Do you have any other symptoms?" (e.g., neck pain, swelling, rash, numbness, fever)     no  Protocols used: Hand and Wrist Pain-A-AH

## 2022-05-11 ENCOUNTER — Ambulatory Visit (INDEPENDENT_AMBULATORY_CARE_PROVIDER_SITE_OTHER): Payer: 59 | Admitting: Primary Care

## 2022-05-11 ENCOUNTER — Encounter (INDEPENDENT_AMBULATORY_CARE_PROVIDER_SITE_OTHER): Payer: Self-pay | Admitting: Primary Care

## 2022-05-11 DIAGNOSIS — F4323 Adjustment disorder with mixed anxiety and depressed mood: Secondary | ICD-10-CM

## 2022-05-14 ENCOUNTER — Encounter (INDEPENDENT_AMBULATORY_CARE_PROVIDER_SITE_OTHER): Payer: Self-pay | Admitting: Primary Care

## 2022-05-14 MED ORDER — BUSPIRONE HCL 5 MG PO TABS: 5.0000 mg | ORAL_TABLET | Freq: Two times a day (BID) | ORAL | 1 refills | Status: DC

## 2022-05-14 NOTE — Progress Notes (Signed)
[name], is a 66 y.o. female  [id number]  [id number]  DOB - 18-Jul-1956  Chief Complaint  Patient presents with   Hand Pain       Subjective:   [name] is a 66 y.o. female here today for a follow up visit. Today she is not her upbeat happy gesture , down , tired depressed and having to be on dialysis. She is c/o having her hands starts to shake for no reason. Discussed secondary to over whelming stress likely anxiety.  Patient has No headache, No chest pain, No abdominal pain - No Nausea,  No Cough - shortness of breath  No problems updated.  Allergies  Allergen Reactions   Bee Pollen Anaphylaxis and Other (See Comments)    UNCONFIRMED, but IS allergic to bee VENOM   Bee Venom Anaphylaxis   Sulfa Antibiotics Itching    Past Medical History:  Diagnosis Date   Acute on chronic systolic CHF (congestive heart failure) (HCC) 12/21/2020   Allergic rhinitis    Arthritis    Asthma    Chronic diastolic CHF (congestive heart failure) (HCC)    COPD (chronic obstructive pulmonary disease) (HCC)    Depression    DM (diabetes mellitus) ([place])    DVT (deep vein thrombosis) in pregnancy    GERD (gastroesophageal reflux disease)    HTN (hypertension)    Hyperlipidemia    Obesity    Pneumonia 04/2018   RIGHT LOBE   Sleep apnea    compliant with CPAP    Current Outpatient Medications on File Prior to Visit  Medication Sig Dispense Refill   acetaminophen (TYLENOL) 325 MG tablet Take 2 tablets (650 mg total) by mouth every 6 (six) hours as needed for mild pain or moderate pain.     albuterol (VENTOLIN HFA) 108 (90 Base) MCG/ACT inhaler INHALE 1-2 PUFFS BY MOUTH EVERY 6 HOURS AS NEEDED FOR WHEEZE OR SHORTNESS OF BREATH 6.7 each 1   amiodarone (PACERONE) 200 MG tablet TAKE 1 TABLET BY MOUTH TWICE A DAY 180 tablet 3   apixaban (ELIQUIS) 5 MG TABS tablet TAKE ONE TABLET BY MOUTH TWICE DAILY at [place] (Patient taking differently: Take 5  mg by mouth in the morning and at bedtime.) 60 tablet 1   colchicine 0.6 MG tablet TAKE 0.5 TABLETS BY MOUTH DAILY. 45 tablet 0   diphenhydrAMINE (BENADRYL) 25 mg capsule Take 25 mg by mouth every 6 (six) hours as needed for allergies or itching.     DULoxetine (CYMBALTA) 60 MG capsule Take 1 capsule (60 mg total) by mouth daily. 180 capsule 0   EPINEPHrine 0.3 mg/0.3 mL IJ SOAJ injection Inject 0.3 mg into the muscle as needed for anaphylaxis. 1 each 1   fluticasone (FLONASE) 50 MCG/ACT nasal spray Place 2 sprays into both nostrils daily as needed for allergies or rhinitis. 16 g 3   glucose blood (ACCU-CHEK GUIDE) test strip USE TO CHECK BLOOD SUGAR 3 TIMES DAILY 100 strip 3   insulin aspart (NOVOLOG) 100 UNIT/ML injection Inject 2-10 Units into the skin 3 (three) times daily before meals. Per sliding scale (Patient taking differently: Inject 2-10 Units into the skin See admin instructions. Inject 2-10 units into the skin three times a day before meals, per sliding scale)     isosorbide mononitrate (IMDUR) 30 MG 24 hr tablet Take 0.5 tablets (15 mg total) by mouth daily. 15 tablet 0   lidocaine (XYLOCAINE) 5 % ointment Apply 1 application. topically  as needed for moderate pain. First choice is lidocaine patch, but if not covered/expensive, please fill ointment (Patient taking differently: Apply 1 application  topically 2 (two) times daily as needed for moderate pain.) 35.44 g 3   LINZESS 72 MCG capsule TAKE 1 CAPSULE (72 MCG TOTAL) BY MOUTH DAILY BEFORE BREAKFAST. IF BM NOT ACHIEVE WITH 1 CAPSULE SHE MAY TAKE 2 DAILY. STOP IF DIARRHEA OCCURS 60 capsule 1   loratadine (CLARITIN) 10 MG tablet Take 1 tablet (10 mg total) by mouth daily. 30 tablet 11   metoprolol succinate (TOPROL-XL) 25 MG 24 hr tablet Take 1 tablet (25 mg total) by mouth daily. 30 tablet 0   Multiple Vitamins-Minerals (MULTIVITAMIN WOMEN PO) Take 1 tablet by mouth daily.     nitroGLYCERIN (NITROSTAT) 0.4 MG SL tablet Place 1 tablet (0.4  mg total) under the tongue every 5 (five) minutes x 3 doses as needed for chest pain. 25 tablet 12   pantoprazole (PROTONIX) 40 MG tablet TAKE ONE TABLET BY MOUTH DAILY AT 9AM 90 tablet 11   polyethylene glycol powder (GLYCOLAX/MIRALAX) 17 GM/SCOOP powder Take 17 g by mouth daily. (Patient taking differently: Take 17 g by mouth daily as needed for mild constipation.) 238 g 0   pregabalin (LYRICA) 100 MG capsule TAKE 1 CAPSULE BY MOUTH THREE TIMES A DAY (Patient taking differently: Take 100 mg by mouth 3 (three) times daily as needed (for pain).) 90 capsule 3   RESTASIS 0.05 % ophthalmic emulsion Place 1 drop into both eyes in the morning and at bedtime.  12   rosuvastatin (CRESTOR) 10 MG tablet Take 1 tablet (10 mg total) by mouth every evening. (Patient taking differently: Take 10 mg by mouth daily.)     senna-docusate (SENOKOT-S) 8.6-50 MG tablet Take 1 tablet by mouth 2 (two) times daily. 14 tablet 0   torsemide (DEMADEX) 100 MG tablet Take 1 tablet by mouth. Monday Wednesday and Friday     TRULICITY 1.5 IO/2.7OJ SOPN INJECT 1.5 MG INTO THE SKIN ONCE A WEEK. (Patient taking differently: Inject 1.5 mg into the skin every Wednesday.) 0.5 mL 2   No current facility-administered medications on file prior to visit.    Objective:   Vitals:   05/11/22 1652  Weight: 286 lb 12.8 oz (130.1 kg)    Exam General appearance : Awake, alert, not in any distress. Speech Clear. Not toxic looking HEENT: Atraumatic and Normocephalic, pupils equally reactive to light and accomodation Neck: Supple, no JVD. No cervical lymphadenopathy.  Chest: Good air entry bilaterally, no added sounds  CVS: S1 S2 regular, no murmurs.  Abdomen: Bowel sounds present, Non tender and not distended with no gaurding, rigidity or rebound. Extremities: B/L Lower Ext shows no edema, both legs are warm to touch Neurology: Awake alert, and oriented X 3, CN II-XII intact, Non focal Skin: No Rash  Data Review Lab Results   Component Value Date   HGBA1C 6.7 (H) 11/21/2021   HGBA1C 6.5 (H) 08/27/2021   HGBA1C 6.2 (H) 07/29/2021    Assessment & Plan   1. Adjustment disorder with mixed anxiety and depressed mood - busPIRone (BUSPAR) 5 MG tablet; Take 1 tablet (5 mg total) by mouth 2 (two) times daily.  Dispense: 180 tablet; Refill: 1  Patient have been counseled extensively about nutrition and exercise. Other issues discussed during this visit include: low cholesterol diet, weight control and daily exercise, foot care, annual eye examinations at Ophthalmology, importance of adherence with medications and regular follow-up. We also discussed  long term complications of uncontrolled diabetes and hypertension.   No follow-ups on file.  The patient was given clear instructions to go to ER or return to medical center if symptoms don't improve, worsen or new problems develop. The patient verbalized understanding. The patient was told to call to get lab results if they haven't heard anything in the next week.   This note has been created with Surveyor, quantity. Any transcriptional errors are unintentional.   [name], NP 05/14/2022, 9:31 PM

## 2022-05-15 ENCOUNTER — Telehealth: Payer: Self-pay | Admitting: Cardiology

## 2022-05-15 NOTE — Telephone Encounter (Signed)
Pt states she was told to call us for medication she can take to avoid her bp dropping during dialysis

## 2022-05-15 NOTE — Telephone Encounter (Signed)
Ensure not taking Imdur or Metoprolol prior to her dialysis sessions.   Discussed with Dr. Harrell Gave.  Most often Midodrine is used prior to dialysis to prevent hypotension. This is usually started by nephrology as they have access to what her BP is during dialysis to determine if she meets criteria and dosing. Recommend she reach out to her nephrology team.  Loel Dubonnet, NP

## 2022-05-15 NOTE — Telephone Encounter (Signed)
Pt updated and verbalized understanding.  

## 2022-05-17 ENCOUNTER — Other Ambulatory Visit (HOSPITAL_COMMUNITY): Payer: Self-pay | Admitting: Family Medicine

## 2022-05-17 DIAGNOSIS — E669 Obesity, unspecified: Secondary | ICD-10-CM

## 2022-05-18 ENCOUNTER — Encounter (HOSPITAL_COMMUNITY): Payer: Self-pay | Admitting: Emergency Medicine

## 2022-05-18 ENCOUNTER — Encounter (HOSPITAL_BASED_OUTPATIENT_CLINIC_OR_DEPARTMENT_OTHER): Payer: Self-pay | Admitting: Cardiology

## 2022-05-18 ENCOUNTER — Other Ambulatory Visit: Payer: Self-pay

## 2022-05-18 ENCOUNTER — Emergency Department (HOSPITAL_COMMUNITY): Payer: 59

## 2022-05-18 ENCOUNTER — Telehealth (HOSPITAL_BASED_OUTPATIENT_CLINIC_OR_DEPARTMENT_OTHER): Payer: Self-pay | Admitting: Cardiology

## 2022-05-18 ENCOUNTER — Emergency Department (HOSPITAL_COMMUNITY)
Admission: EM | Admit: 2022-05-18 | Discharge: 2022-05-18 | Disposition: A | Discharge status: Home / Self Care | Payer: 59 | Attending: Emergency Medicine | Admitting: Emergency Medicine

## 2022-05-18 DIAGNOSIS — J449 Chronic obstructive pulmonary disease, unspecified: Secondary | ICD-10-CM | POA: Diagnosis not present

## 2022-05-18 DIAGNOSIS — R079 Chest pain, unspecified: Secondary | ICD-10-CM | POA: Insufficient documentation

## 2022-05-18 DIAGNOSIS — Z7901 Long term (current) use of anticoagulants: Secondary | ICD-10-CM | POA: Diagnosis not present

## 2022-05-18 DIAGNOSIS — Z794 Long term (current) use of insulin: Secondary | ICD-10-CM | POA: Insufficient documentation

## 2022-05-18 DIAGNOSIS — I4891 Unspecified atrial fibrillation: Secondary | ICD-10-CM | POA: Insufficient documentation

## 2022-05-18 DIAGNOSIS — Z20822 Contact with and (suspected) exposure to covid-19: Secondary | ICD-10-CM | POA: Diagnosis not present

## 2022-05-18 DIAGNOSIS — M7989 Other specified soft tissue disorders: Secondary | ICD-10-CM | POA: Insufficient documentation

## 2022-05-18 DIAGNOSIS — R202 Paresthesia of skin: Secondary | ICD-10-CM | POA: Insufficient documentation

## 2022-05-18 DIAGNOSIS — E119 Type 2 diabetes mellitus without complications: Secondary | ICD-10-CM | POA: Diagnosis not present

## 2022-05-18 DIAGNOSIS — R61 Generalized hyperhidrosis: Secondary | ICD-10-CM | POA: Diagnosis not present

## 2022-05-18 DIAGNOSIS — Z7951 Long term (current) use of inhaled steroids: Secondary | ICD-10-CM | POA: Insufficient documentation

## 2022-05-18 DIAGNOSIS — R600 Localized edema: Secondary | ICD-10-CM | POA: Insufficient documentation

## 2022-05-18 LAB — COMPREHENSIVE METABOLIC PANEL
ALT: 20 U/L (ref 0–44)
AST: 22 U/L (ref 15–41)
Albumin: 3.7 g/dL (ref 3.5–5.0)
Alkaline Phosphatase: 115 U/L (ref 38–126)
Anion gap: 13 (ref 5–15)
BUN: 47 mg/dL — ABNORMAL HIGH (ref 8–23)
CO2: 26 mmol/L (ref 22–32)
Calcium: 9.6 mg/dL (ref 8.9–10.3)
Chloride: 99 mmol/L (ref 98–111)
Creatinine, Ser: 7.75 mg/dL — ABNORMAL HIGH (ref 0.44–1.00)
GFR, Estimated: 5 mL/min — ABNORMAL LOW (ref >60)
Glucose, Bld: 132 mg/dL — ABNORMAL HIGH (ref 70–99)
Potassium: 4.1 mmol/L (ref 3.5–5.1)
Sodium: 138 mmol/L (ref 135–145)
Total Bilirubin: 0.6 mg/dL (ref 0.3–1.2)
Total Protein: 8 g/dL (ref 6.5–8.1)

## 2022-05-18 LAB — RESP PANEL BY RT-PCR (FLU A&B, COVID) ARPGX2
Influenza A by PCR: NEGATIVE
Influenza B by PCR: NEGATIVE
SARS Coronavirus 2 by RT PCR: NEGATIVE

## 2022-05-18 LAB — CBC WITH DIFFERENTIAL/PLATELET
Abs Immature Granulocytes: 0.02 10*3/uL (ref 0.00–0.07)
Basophils Absolute: 0 10*3/uL (ref 0.0–0.1)
Basophils Relative: 0 %
Eosinophils Absolute: 0.2 10*3/uL (ref 0.0–0.5)
Eosinophils Relative: 2 %
HCT: 37 % (ref 36.0–46.0)
Hemoglobin: 11.3 g/dL — ABNORMAL LOW (ref 12.0–15.0)
Immature Granulocytes: 0 %
Lymphocytes Relative: 9 %
Lymphs Abs: 0.7 10*3/uL (ref 0.7–4.0)
MCH: 30.1 pg (ref 26.0–34.0)
MCHC: 30.5 g/dL (ref 30.0–36.0)
MCV: 98.7 fL (ref 80.0–100.0)
Monocytes Absolute: 1 10*3/uL (ref 0.1–1.0)
Monocytes Relative: 14 %
Neutro Abs: 5.5 10*3/uL (ref 1.7–7.7)
Neutrophils Relative %: 75 %
Platelets: 125 10*3/uL — ABNORMAL LOW (ref 150–400)
RBC: 3.75 MIL/uL — ABNORMAL LOW (ref 3.87–5.11)
RDW: 17.3 % — ABNORMAL HIGH (ref 11.5–15.5)
WBC: 7.4 10*3/uL (ref 4.0–10.5)
nRBC: 0 % (ref 0.0–0.2)

## 2022-05-18 LAB — BRAIN NATRIURETIC PEPTIDE: B Natriuretic Peptide: 263.5 pg/mL — ABNORMAL HIGH (ref 0.0–100.0)

## 2022-05-18 LAB — TROPONIN I (HIGH SENSITIVITY)
Troponin I (High Sensitivity): 8 ng/L (ref <18)
Troponin I (High Sensitivity): 7 ng/L (ref <18)
Troponin I (High Sensitivity): 6 ng/L (ref <18)

## 2022-05-18 LAB — CBG MONITORING, ED: Glucose-Capillary: 91 mg/dL (ref 70–99)

## 2022-05-18 MED ORDER — LIDOCAINE 5 % EX PTCH
1.0000 | MEDICATED_PATCH | CUTANEOUS | Status: DC
  Administered 2022-05-18: 1 via TRANSDERMAL
  Filled 2022-05-18: qty 1

## 2022-05-18 MED ORDER — ACETAMINOPHEN 500 MG PO TABS
1000.0000 mg | ORAL_TABLET | ORAL | Status: AC
  Administered 2022-05-18: 1000 mg via ORAL
  Filled 2022-05-18: qty 2

## 2022-05-18 NOTE — ED Triage Notes (Signed)
Arrives via EMS from home for PC that woke her up, radiates to L arm.  Received HD, due for trx today. EMS notes no adventatious lung sounds. Received '324mg'$  ASA, nitro x1 (felt poorly after), zofran en route Baseline PRN O2 at home, 2L with EMS or comfort.  EMS V/S: frequent PVCs, 119/56, 171CBG

## 2022-05-18 NOTE — Telephone Encounter (Signed)
Patient went to the ER at 3am this morning but she still in th waiting area. Calling to see what if the dr can see because she in  a lot of pain. Please advise

## 2022-05-18 NOTE — ED Provider Notes (Signed)
[facility] EMERGENCY DEPARTMENT Provider Note   CSN: [id number] Arrival date & time: 05/18/22  0416     History  Chief Complaint  Patient presents with   Chest Pain    [name] is a 66 y.o. female.  66 year old female with a history of heart failure with preserved ejection fraction, mitral and tricuspid valve replacement, paroxysmal atrial fibrillation on amiodarone and Eliquis, DM 2, COPD presents with chest pain that started at 3 AM last night.  Describes it as a tightness that radiates to her left arm and left neck.  States that it is not exertional but she did experience vomiting 3 times that was nonbloody nonbilious and diaphoresis with it.  Says it is pleuritic.  Has bilateral lower extremity swelling.  Says that she has also had fevers and a dry cough that started last night.  No known sick contacts.  Has not taken her temperature with the thermostat at home. No hx of CAD or PCI.    Missed dialysis today because she was in the waiting room but reports going on Tuesday and has been compliant with her dialysis medications otherwise.      Home Medications Prior to Admission medications   Medication Sig Start Date End Date Taking? Authorizing Provider  acetaminophen (TYLENOL) 325 MG tablet Take 2 tablets (650 mg total) by mouth every 6 (six) hours as needed for mild pain or moderate pain. 12/07/21   Arrien, Jimmy Picket, MD  albuterol (VENTOLIN HFA) 108 (90 Base) MCG/ACT inhaler INHALE 1-2 PUFFS BY MOUTH EVERY 6 HOURS AS NEEDED FOR WHEEZE OR SHORTNESS OF BREATH 04/12/22   [name], NP  amiodarone (PACERONE) 200 MG tablet TAKE 1 TABLET BY MOUTH TWICE A DAY 04/25/22   Bensimhon, Shaune Pascal, MD  apixaban (ELIQUIS) 5 MG TABS tablet TAKE ONE TABLET BY MOUTH TWICE DAILY at [place] Patient taking differently: Take 5 mg by mouth in the morning and at bedtime. 02/24/22   Shelly Coss, MD  busPIRone (BUSPAR) 5 MG tablet Take 1 tablet (5 mg total) by mouth  2 (two) times daily. 05/14/22   [name], NP  colchicine 0.6 MG tablet TAKE 0.5 TABLETS BY MOUTH DAILY. 04/03/22   Charlott Rakes, MD  diphenhydrAMINE (BENADRYL) 25 mg capsule Take 25 mg by mouth every 6 (six) hours as needed for allergies or itching.    [provider]  DULoxetine (CYMBALTA) 60 MG capsule Take 1 capsule (60 mg total) by mouth daily. 05/08/22   [name], NP  EPINEPHrine 0.3 mg/0.3 mL IJ SOAJ injection Inject 0.3 mg into the muscle as needed for anaphylaxis. 06/25/20   [name], NP  fluticasone (FLONASE) 50 MCG/ACT nasal spray Place 2 sprays into both nostrils daily as needed for allergies or rhinitis. 04/25/22   [name], NP  glucose blood (ACCU-CHEK GUIDE) test strip USE TO CHECK BLOOD SUGAR 3 TIMES DAILY 04/03/22   Charlott Rakes, MD  insulin aspart (NOVOLOG) 100 UNIT/ML injection Inject 2-10 Units into the skin 3 (three) times daily before meals. Per sliding scale Patient taking differently: Inject 2-10 Units into the skin See admin instructions. Inject 2-10 units into the skin three times a day before meals, per sliding scale 09/02/21   Aline August, MD  isosorbide mononitrate (IMDUR) 30 MG 24 hr tablet Take 0.5 tablets (15 mg total) by mouth daily. 03/18/22 04/17/22  Arrien, Jimmy Picket, MD  lidocaine (XYLOCAINE) 5 % ointment Apply 1 application. topically as  needed for moderate pain. First choice is lidocaine patch, but if not covered/expensive, please fill ointment Patient taking differently: Apply 1 application  topically 2 (two) times daily as needed for moderate pain. 11/08/21   Buford Dresser, MD  LINZESS 72 MCG capsule TAKE 1 CAPSULE (72 MCG TOTAL) BY MOUTH DAILY BEFORE BREAKFAST. IF BM NOT ACHIEVE WITH 1 CAPSULE SHE MAY TAKE 2 DAILY. STOP IF DIARRHEA OCCURS 04/25/22   [name], NP  loratadine (CLARITIN) 10 MG tablet Take 1 tablet (10 mg total) by mouth daily. 12/29/21   [name], NP  metoprolol  succinate (TOPROL-XL) 25 MG 24 hr tablet Take 1 tablet (25 mg total) by mouth daily. 03/18/22 04/17/22  Arrien, Jimmy Picket, MD  Multiple Vitamins-Minerals (MULTIVITAMIN WOMEN PO) Take 1 tablet by mouth daily.    [provider]  nitroGLYCERIN (NITROSTAT) 0.4 MG SL tablet Place 1 tablet (0.4 mg total) under the tongue every 5 (five) minutes x 3 doses as needed for chest pain. 01/08/14   Charolette Forward, MD  pantoprazole (PROTONIX) 40 MG tablet TAKE ONE TABLET BY MOUTH DAILY AT 9AM 04/17/22   [name], NP  polyethylene glycol powder (GLYCOLAX/MIRALAX) 17 GM/SCOOP powder Take 17 g by mouth daily. Patient taking differently: Take 17 g by mouth daily as needed for mild constipation. 02/25/22   Shelly Coss, MD  pregabalin (LYRICA) 100 MG capsule TAKE 1 CAPSULE BY MOUTH THREE TIMES A DAY Patient taking differently: Take 100 mg by mouth 3 (three) times daily as needed (for pain). 02/26/22   [name], NP  RESTASIS 0.05 % ophthalmic emulsion Place 1 drop into both eyes in the morning and at bedtime. 11/19/17   [provider]  rosuvastatin (CRESTOR) 10 MG tablet TAKE ONE TABLET BY MOUTH DAILY AT 5PM 05/18/22   Joette Catching, PA-C  senna-docusate (SENOKOT-S) 8.6-50 MG tablet Take 1 tablet by mouth 2 (two) times daily. 02/24/22   Shelly Coss, MD  torsemide (DEMADEX) 100 MG tablet Take 1 tablet by mouth. Monday Wednesday and Friday 04/02/22   [provider]  TRULICITY 1.5 KW/4.0XB SOPN INJECT 1.5 MG INTO THE SKIN ONCE A WEEK. Patient taking differently: Inject 1.5 mg into the skin every Wednesday. 02/26/22   [name], NP      Allergies    Bee pollen, Bee venom, and Sulfa antibiotics    Review of Systems   Review of Systems  Physical Exam Updated Vital Signs BP 106/70 (BP Location: Right Arm)   Pulse 70   Temp 98.1 F (36.7 C) (Oral)   Resp 18   Wt 130 kg   SpO2 100%   BMI 44.89 kg/m  Physical Exam Vitals and nursing note reviewed.   Constitutional:      General: She is not in acute distress.    Appearance: She is well-developed.  HENT:     Head: Normocephalic and atraumatic.     Right Ear: External ear normal.     Left Ear: External ear normal.     Nose: Nose normal.  Eyes:     Extraocular Movements: Extraocular movements intact.     Conjunctiva/sclera: Conjunctivae normal.     Pupils: Pupils are equal, round, and reactive to light.  Cardiovascular:     Rate and Rhythm: Normal rate and regular rhythm.     Heart sounds: No murmur heard. Pulmonary:     Effort: Pulmonary effort is normal. No respiratory distress.     Breath sounds: Normal breath sounds.  Abdominal:     General: Abdomen is flat. There is no distension.     Palpations: Abdomen is soft. There is no mass.     Tenderness: There is no abdominal tenderness. There is no guarding.  Musculoskeletal:        General: No swelling.     Cervical back: Normal range of motion and neck supple.     Right lower leg: Edema (Nonpitting) present.     Left lower leg: Edema (Nonpitting) present.  Skin:    General: Skin is warm and dry.     Capillary Refill: Capillary refill takes less than 2 seconds.  Neurological:     Mental Status: She is alert and oriented to person, place, and time. Mental status is at baseline.  Psychiatric:        Mood and Affect: Mood normal.     ED Results / Procedures / Treatments   Labs (all labs ordered are listed, but only abnormal results are displayed) Labs Reviewed  CBC WITH DIFFERENTIAL/PLATELET - Abnormal; Notable for the following components:      Result Value   RBC 3.75 (*)    Hemoglobin 11.3 (*)    RDW 17.3 (*)    Platelets 125 (*)    All other components within normal limits  COMPREHENSIVE METABOLIC PANEL - Abnormal; Notable for the following components:   Glucose, Bld 132 (*)    BUN 47 (*)    Creatinine, Ser 7.75 (*)    GFR, Estimated 5 (*)    All other components within normal limits  BRAIN NATRIURETIC PEPTIDE  - Abnormal; Notable for the following components:   B Natriuretic Peptide 263.5 (*)    All other components within normal limits  RESP PANEL BY RT-PCR (FLU A&B, COVID) ARPGX2  CBG MONITORING, ED  TROPONIN I (HIGH SENSITIVITY)  TROPONIN I (HIGH SENSITIVITY)  TROPONIN I (HIGH SENSITIVITY)    EKG EKG Interpretation  Date/Time:  Thursday May 18 2022 15:21:11 EDT Ventricular Rate:  79 PR Interval:  196 QRS Duration: 100 QT Interval:  462 QTC Calculation: 529 R Axis:   129 Text Interpretation: Sinus rhythm with frequent Premature ventricular complexes Possible Right ventricular hypertrophy Prolonged QT Abnormal ECG When compared with ECG of 18-May-2022 15:19, PREVIOUS ECG IS PRESENT Confirmed by Margaretmary Eddy [phone]) on 05/18/2022 4:23:39 PM  Radiology DG Chest 2 View  Result Date: 05/18/2022 CLINICAL DATA:  67 year old female with history of chest pain. EXAM: CHEST - 2 VIEW COMPARISON:  Chest x-ray 04/01/2022. FINDINGS: Right internal jugular PermCath with tip terminating in the superior aspect of the right atrium. Left atrial appendage ligation clip. Status post median sternotomy for mitral and tricuspid annuloplasty. Lung volumes are normal. No consolidative airspace disease. No pleural effusions. No pneumothorax. No pulmonary nodule or mass noted. Cephalization of the pulmonary vasculature, without frank pulmonary edema. Heart size is mildly enlarged. Upper mediastinal contours are within normal limits. Atherosclerotic calcifications are noted in the thoracic aorta. IMPRESSION: 1. Postoperative changes and support apparatus, as above. 2. Cardiomegaly with pulmonary venous congestion, but no frank pulmonary edema. 3. Aortic atherosclerosis. Electronically Signed   By: Vinnie Langton M.D.   On: 05/18/2022 05:36    Procedures Procedures   Medications Ordered in ED Medications  acetaminophen (TYLENOL) tablet 1,000 mg (1,000 mg Oral Given 05/18/22 1630)    ED Course/ Medical  Decision Making/ A&P Clinical Course as of 05/19/22 0805  Thu May 18, 2022  1513 Spoke with Dr. Davina Poke from cardiology about patient's presentation.  He feels that her symptoms are unlikely to be due to MI given her left heart cath in 2019 with nonobstructive disease.  Does not feel that stress testing would be warranted if repeat troponin is WNL. [RP]    Clinical Course User Index [RP] Fransico Meadow, MD                           Medical Decision Making Risk OTC drugs.   KISMET FACEMIRE is a 66 y.o. female with comorbidities that complicate the patient evaluation including heart failure with preserved ejection fraction, mitral and tricuspid valve replacement, paroxysmal atrial fibrillation on amiodarone and Eliquis, DM 2, COPD presents with chest pain that started at 3 AM last night.  This patient presents to the ED for concern of complaints listed in HPI, this involves an extensive number of treatment options, and is a complaint that carries with it a high risk of complications and morbidity.   Initial Ddx:  MI, PE, dissection, pneumonia  MDM:  Initially there is concern for possible MI given the patient's description of her symptoms.  Also has several risk factors for this diagnosis.  Triage EKG did not show STEMI but will obtain troponins at this time.  Also considered pulmonary embolism but feel the patient is very low risk given her Eliquis use.  Also is not having shortness of breath and is satting well on room air so do not feel that additional testing/imaging is warranted at this time.  Did consider dissection but since the patient's valvular procedures were of her mitral and tricuspid valve and her description of pain is not ripping or tearing or radiating to the back do not feel that this is likely.  Also considered pneumonia given her cough so will obtain chest x-ray.  Plan:  Labs Troponin EKG Chest x-ray COVID/flu Tylenol Lidocaine patch  ED Summary:  Patient was  reevaluated and was feeling better after her Tylenol and lidocaine patch.  Labs were obtained which did not show electrolyte abnormalities that would warrant emergent IHD at this time.  Chest x-ray without acute findings or pneumonia or widened mediastinum.  COVID and flu were negative.  Discussed with cardiology who reviewed the patient's case and does not feel that stress testing or coronary CTA or admission were warranted at this time from an MI perspective if her second troponin was WNL.  Initial troponin was WNL and patient was awaiting second troponin at the time of signout to Dr. Tamera Punt.  Dispo: Pending remainder of workup  Records reviewed ED Visit Notes The following labs were independently interpreted: Chemistry and show CKD I independently reviewed the following imaging with scope of interpretation limited to determining acute life threatening conditions related to emergency care: Chest x-ray, which revealed no infiltrates, PTX, or widened mediastinum  I personally reviewed and interpreted the pt's EKG: see above for interpretation  I have reviewed the patients home medications and made adjustments as needed Consults: Cardiology  Final Clinical Impression(s) / ED Diagnoses Final diagnoses:  Nonspecific chest pain    Rx / DC Orders ED Discharge Orders     None         Fransico Meadow, MD 05/19/22 [phone]

## 2022-05-18 NOTE — Discharge Instructions (Signed)
Follow-up with your cardiologist as discussed.  Return to the emergency room if you have any worsening symptoms.

## 2022-05-18 NOTE — Telephone Encounter (Signed)
This encounter was created in error - please disregard.

## 2022-05-18 NOTE — Telephone Encounter (Signed)
Sister called back Explained she was not on DPR but would call patient directly  Spoke with patient and advised she needed to stay in ED and continue with evaluation, verbalized understanding

## 2022-05-18 NOTE — ED Provider Triage Note (Signed)
Emergency Medicine Provider Triage Evaluation Note  [name] , a 66 y.o. female  was evaluated in triage.  Pt complains of CP that woke her from her sleep; radiates to her left arm. SOB. Due for HD this morning, on HD x 2 months T/TH/Sat. ASA and nitro enroute without improvement. Feels like pressure.   Review of Systems  Positive: As above, nausea Negative: Fevers, syncope  Physical Exam  BP 135/80 (BP Location: Right Arm)   Pulse 95   Temp 99.1 F (37.3 C) (Oral)   Resp 20   Wt 130 kg   SpO2 98%   BMI 44.89 kg/m  Gen:   Awake, no distress   Resp:  Normal effort  MSK:   Moves extremities without difficulty  Other:  RRR no m/r/g. Rales in the left lung base. BLE edema, nonpitting, symmetric.    Medical Decision Making  Medically screening exam initiated at 4:47 AM.  Appropriate orders placed.  AVANELL BANWART was informed that the remainder of the evaluation will be completed by another provider, this initial triage assessment does not replace that evaluation, and the importance of remaining in the ED until their evaluation is complete.  This chart was dictated using voice recognition software, Dragon. Despite the best efforts of this provider to proofread and correct errors, errors may still occur which can change documentation meaning.    Emeline Darling, PA-C 05/18/22 [phone]

## 2022-05-18 NOTE — ED Provider Notes (Signed)
Patient care was taken over from Dr. Philip Aspen.  She presents with chest pain.  It is nonexertional.  It was somewhat pleuritic but she does not have any hypoxia, tachycardia or other suggestions of PE.  Her EKG does not show any ischemic changes.  She has had 2 negative troponins.  Dr. Philip Aspen had discussed the patient with cardiology who felt that patient could go home if her second troponin was normal which it was.  She was discharged home in good condition.  She was encouraged to have close follow-up with her cardiologist.  Return precautions were given.   Malvin Johns, MD 05/18/22 [phone]

## 2022-05-18 NOTE — Telephone Encounter (Signed)
Sister returned RN's call.

## 2022-05-18 NOTE — Telephone Encounter (Signed)
Called patient, went to voicemail [employer] message sent to patient

## 2022-05-21 ENCOUNTER — Encounter (HOSPITAL_BASED_OUTPATIENT_CLINIC_OR_DEPARTMENT_OTHER): Payer: Self-pay | Admitting: Cardiology

## 2022-05-22 ENCOUNTER — Ambulatory Visit (INDEPENDENT_AMBULATORY_CARE_PROVIDER_SITE_OTHER): Payer: Self-pay | Admitting: *Deleted

## 2022-05-22 NOTE — Telephone Encounter (Signed)
Will forward to provider  

## 2022-05-22 NOTE — Telephone Encounter (Signed)
Summary: pt wants medication no appt available   Pt has a headache and bad cough, no appt available till Monday, She was wanting to get medication, She wanted to be called if appt cancelled, but not interested in  making Monday appt, which is 1st available.   [phone]        Chief Complaint: headache, pressure in forehead, requesting medication  Symptoms: headache forehead, left ear sounds like "bubbling" when laying down. Sore throat, cough, congestion, blows nose for light yellow mucus. Chest pain comes and goes. Bilateral leg swelling up to knees. Dizziness when standing. Has been seen in ED 05/18/22. Difficulty breathing when laying down.  Frequency: last Thursday 05/18/22 Pertinent Negatives: Patient denies fever. Disposition: '[]'$ ED /'[x]'$ Urgent Care (no appt availability in office) / '[]'$ Appointment(In office/virtual)/ '[]'$  [facility]/ '[]'$ Home Care/ '[]'$ Refused Recommended Disposition /'[]'$ [facility]/ '[]'$  Follow-up with PCP Additional Notes:   Recommended UC/ED. No appt available until next week. Please advise . See ED visit 05/18/22     Reason for Disposition  [1] SEVERE headache (e.g., excruciating) AND [2] not improved after 2 hours of pain medicine  Answer Assessment - Initial Assessment Questions 1. LOCATION: "Where does it hurt?"      Forehead feels pressure 2. ONSET: "When did the headache start?" (Minutes, hours or days)      Saturday 05/20/22 3. PATTERN: "Does the pain come and go, or has it been constant since it started?"     Constant  4. SEVERITY: "How bad is the pain?" and "What does it keep you from doing?"  (e.g., Scale 1-10; mild, moderate, or severe)   - MILD (1-3): doesn't interfere with normal activities    - MODERATE (4-7): interferes with normal activities or awakens from sleep    - SEVERE (8-10): excruciating pain, unable to do any normal activities        Moderate unable to lay down flat 5. RECURRENT SYMPTOM: "Have you ever had headaches  before?" If Yes, ask: "When was the last time?" and "What happened that time?"      Na  6. CAUSE: "What do you think is causing the headache?"     Not sure  7. MIGRAINE: "Have you been diagnosed with migraine headaches?" If Yes, ask: "Is this headache similar?"      na 8. HEAD INJURY: "Has there been any recent injury to the head?"      na 9. OTHER SYMPTOMS: "Do you have any other symptoms?" (fever, stiff neck, eye pain, sore throat, cold symptoms)     Sore throat , left ear pain sounds like bubbling when laying flat. Headache pressure in forehead, congestion, blows nose for light yellow mucus. Chest pain, comes and goes, swelling in legs, dizziness when standing.  10. PREGNANCY: "Is there any chance you are pregnant?" "When was your last menstrual period?"       na  Protocols used: [facility]

## 2022-05-23 ENCOUNTER — Ambulatory Visit (INDEPENDENT_AMBULATORY_CARE_PROVIDER_SITE_OTHER): Payer: 59 | Admitting: Primary Care

## 2022-05-23 DIAGNOSIS — R058 Other specified cough: Secondary | ICD-10-CM

## 2022-05-23 MED ORDER — BENZONATATE 100 MG PO CAPS: 100.0000 mg | ORAL_CAPSULE | Freq: Three times a day (TID) | ORAL | 0 refills | Status: DC | PRN

## 2022-05-23 MED ORDER — GUAIFENESIN 200 MG PO TABS: 200.0000 mg | ORAL_TABLET | ORAL | 0 refills | Status: DC | PRN

## 2022-05-23 NOTE — Progress Notes (Signed)
[facility]  Telephone Note  I connected with [name], on 05/23/2022 at 9:89 PM through  application or by telephone and verified that I am speaking with the correct person using two identifiers.   Consent: I discussed the limitations, risks, security and privacy concerns of performing an evaluation and management service by telephone and the availability of in person appointments. I also discussed with the patient that there may be a patient responsible charge related to this service. The patient expressed understanding and agreed to proceed.   Location of Patient: Home  Location of Provider: [facility] [place]   Persons participating in Telemedicine visit: Roshawna Colclasure Berenda Morale,  NP  History of Present Illness: [name] is a 66 y.o. female is having a tele visit with acute symptoms started last 05/19/22 cough, congestion, sore throat, shortness of breath no causative factor. ( Different shortness of breath from CHF. Denies  fever or chills. Dry hacking cough.  05/18/22. COVID test negative.    Past Medical History:  Diagnosis Date   Acute on chronic systolic CHF (congestive heart failure) (HCC) 12/21/2020   Allergic rhinitis    Arthritis    Asthma    Chronic diastolic CHF (congestive heart failure) (HCC)    COPD (chronic obstructive pulmonary disease) (HCC)    Depression    DM (diabetes mellitus) ([place])    DVT (deep vein thrombosis) in pregnancy    GERD (gastroesophageal reflux disease)    HTN (hypertension)    Hyperlipidemia    Obesity    Pneumonia 04/2018   RIGHT LOBE   Sleep apnea    compliant with CPAP   Allergies  Allergen Reactions   Bee Pollen Anaphylaxis and Other (See Comments)    UNCONFIRMED, but IS allergic to bee VENOM   Bee Venom Anaphylaxis   Sulfa Antibiotics Itching    Current Outpatient Medications on File Prior to Visit  Medication Sig Dispense Refill    acetaminophen (TYLENOL) 325 MG tablet Take 2 tablets (650 mg total) by mouth every 6 (six) hours as needed for mild pain or moderate pain.     albuterol (VENTOLIN HFA) 108 (90 Base) MCG/ACT inhaler INHALE 1-2 PUFFS BY MOUTH EVERY 6 HOURS AS NEEDED FOR WHEEZE OR SHORTNESS OF BREATH 6.7 each 1   amiodarone (PACERONE) 200 MG tablet TAKE 1 TABLET BY MOUTH TWICE A DAY 180 tablet 3   apixaban (ELIQUIS) 5 MG TABS tablet TAKE ONE TABLET BY MOUTH TWICE DAILY at [place] (Patient taking differently: Take 5 mg by mouth in the morning and at bedtime.) 60 tablet 1   busPIRone (BUSPAR) 5 MG tablet Take 1 tablet (5 mg total) by mouth 2 (two) times daily. 180 tablet 1   colchicine 0.6 MG tablet TAKE 0.5 TABLETS BY MOUTH DAILY. 45 tablet 0   diphenhydrAMINE (BENADRYL) 25 mg capsule Take 25 mg by mouth every 6 (six) hours as needed for allergies or itching.     DULoxetine (CYMBALTA) 60 MG capsule Take 1 capsule (60 mg total) by mouth daily. 180 capsule 0   EPINEPHrine 0.3 mg/0.3 mL IJ SOAJ injection Inject 0.3 mg into the muscle as needed for anaphylaxis. 1 each 1   fluticasone (FLONASE) 50 MCG/ACT nasal spray Place 2 sprays into both nostrils daily as needed for allergies or rhinitis. 16 g 3   glucose blood (ACCU-CHEK GUIDE) test strip USE TO CHECK BLOOD SUGAR 3 TIMES DAILY 100 strip 3  insulin aspart (NOVOLOG) 100 UNIT/ML injection Inject 2-10 Units into the skin 3 (three) times daily before meals. Per sliding scale (Patient taking differently: Inject 2-10 Units into the skin See admin instructions. Inject 2-10 units into the skin three times a day before meals, per sliding scale)     isosorbide mononitrate (IMDUR) 30 MG 24 hr tablet Take 0.5 tablets (15 mg total) by mouth daily. 15 tablet 0   lidocaine (XYLOCAINE) 5 % ointment Apply 1 application. topically as needed for moderate pain. First choice is lidocaine patch, but if not covered/expensive, please fill ointment (Patient taking differently: Apply 1 application   topically 2 (two) times daily as needed for moderate pain.) 35.44 g 3   LINZESS 72 MCG capsule TAKE 1 CAPSULE (72 MCG TOTAL) BY MOUTH DAILY BEFORE BREAKFAST. IF BM NOT ACHIEVE WITH 1 CAPSULE SHE MAY TAKE 2 DAILY. STOP IF DIARRHEA OCCURS 60 capsule 1   loratadine (CLARITIN) 10 MG tablet Take 1 tablet (10 mg total) by mouth daily. 30 tablet 11   metoprolol succinate (TOPROL-XL) 25 MG 24 hr tablet Take 1 tablet (25 mg total) by mouth daily. 30 tablet 0   Multiple Vitamins-Minerals (MULTIVITAMIN WOMEN PO) Take 1 tablet by mouth daily.     nitroGLYCERIN (NITROSTAT) 0.4 MG SL tablet Place 1 tablet (0.4 mg total) under the tongue every 5 (five) minutes x 3 doses as needed for chest pain. 25 tablet 12   pantoprazole (PROTONIX) 40 MG tablet TAKE ONE TABLET BY MOUTH DAILY AT 9AM 90 tablet 11   polyethylene glycol powder (GLYCOLAX/MIRALAX) 17 GM/SCOOP powder Take 17 g by mouth daily. (Patient taking differently: Take 17 g by mouth daily as needed for mild constipation.) 238 g 0   pregabalin (LYRICA) 100 MG capsule TAKE 1 CAPSULE BY MOUTH THREE TIMES A DAY (Patient taking differently: Take 100 mg by mouth 3 (three) times daily as needed (for pain).) 90 capsule 3   RESTASIS 0.05 % ophthalmic emulsion Place 1 drop into both eyes in the morning and at bedtime.  12   rosuvastatin (CRESTOR) 10 MG tablet TAKE ONE TABLET BY MOUTH DAILY AT 5PM 90 tablet 0   senna-docusate (SENOKOT-S) 8.6-50 MG tablet Take 1 tablet by mouth 2 (two) times daily. 14 tablet 0   torsemide (DEMADEX) 100 MG tablet Take 1 tablet by mouth. Monday Wednesday and Friday     TRULICITY 1.5 RF/1.6BW SOPN INJECT 1.5 MG INTO THE SKIN ONCE A WEEK. (Patient taking differently: Inject 1.5 mg into the skin every Wednesday.) 0.5 mL 2   No current facility-administered medications on file prior to visit.    Observations/Objective: Bp 142/86, 95% on room air   Assessment and Plan: Diagnoses and all orders for this visit:  Cough productive of purulent  sputum Asked patient to check her lymph nodes frontal, maxillary and cervical she denied pain or swelling. Treatment Tessalon Perles 100 mg 3 times daily as needed and guaifenesin 200 mg every 4 hours as needed.  If no improvement patient will need to be seen in person     Follow Up Instructions: Keep schedule appt.   I discussed the assessment and treatment plan with the patient. The patient was provided an opportunity to ask questions and all were answered. The patient agreed with the plan and demonstrated an understanding of the instructions.   The patient was advised to call back or seek an in-person evaluation if the symptoms worsen or if the condition fails to improve as anticipated.  I provided  15 minutes total of non-face-to-face time during this encounter including median intraservice time, reviewing previous notes, investigations, ordering medications, medical decision making, coordinating care and patient verbalized understanding at the end of the visit.    This note has been created with Surveyor, quantity. Any transcriptional errors are unintentional.   [name], NP 05/23/2022, 2:46 PM

## 2022-05-23 NOTE — Telephone Encounter (Signed)
Pt is scheduled for a virtual appt with Michelle 10/3 at 330pm

## 2022-06-06 ENCOUNTER — Ambulatory Visit (INDEPENDENT_AMBULATORY_CARE_PROVIDER_SITE_OTHER): Payer: Self-pay | Admitting: *Deleted

## 2022-06-06 NOTE — Telephone Encounter (Signed)
  Chief Complaint: productive cough Symptoms: yellow sputum with blood streaks Frequency: 10-114 days Pertinent Negatives: Patient denies fever, SOB Disposition: '[]'$ ED /'[x]'$ Urgent Care (no appt availability in office) / '[]'$ Appointment(In office/virtual)/ '[]'$  [facility]/ '[]'$ Home Care/ '[]'$ Refused Recommended Disposition /'[]'$ [facility]/ '[]'$  Follow-up with PCP Additional Notes: No appt available, pt going to UC. Home care discussed.   Reason for Disposition  [1] MILD difficulty breathing (e.g., minimal/no SOB at rest, SOB with walking, pulse <100) AND [2] still present when not coughing  Answer Assessment - Initial Assessment Questions 1. ONSET: "When did the cough begin?"      2 weeks 2. SEVERITY: "How bad is the cough today?"      Pretty constant 3. SPUTUM: "Describe the color of your sputum" (none, dry cough; clear, white, yellow, green)     Yellow with blood streaks 4. HEMOPTYSIS: "Are you coughing up any blood?" If so ask: "How much?" (flecks, streaks, tablespoons, etc.)     Streaks of blood sometimes 5. DIFFICULTY BREATHING: "Are you having difficulty breathing?" If Yes, ask: "How bad is it?" (e.g., mild, moderate, severe)    - MILD: No SOB at rest, mild SOB with walking, speaks normally in sentences, can lie down, no retractions, pulse < 100.    - MODERATE: SOB at rest, SOB with minimal exertion and prefers to sit, cannot lie down flat, speaks in phrases, mild retractions, audible wheezing, pulse 100-120.    - SEVERE: Very SOB at rest, speaks in single words, struggling to breathe, sitting hunched forward, retractions, pulse > 120      no 6. FEVER: "Do you have a fever?" If Yes, ask: "What is your temperature, how was it measured, and when did it start?"     no 7. CARDIAC HISTORY: "Do you have any history of heart disease?" (e.g., heart attack, congestive heart failure)      CHF, HD, OHS 8. LUNG HISTORY: "Do you have any history of lung disease?"  (e.g., pulmonary  embolus, asthma, emphysema)     asthma 9. PE RISK FACTORS: "Do you have a history of blood clots?" (or: recent major surgery, recent prolonged travel, bedridden)     yes 10. OTHER SYMPTOMS: "Do you have any other symptoms?" (e.g., runny nose, wheezing, chest pain)       Pressure in chest from coughing, HD said over load fluid 11. PREGNANCY: "Is there any chance you are pregnant?" "When was your last menstrual period?"       no 12. TRAVEL: "Have you traveled out of the country in the last month?" (e.g., travel history, exposures)       no  Protocols used: Cough - Acute Productive-A-AH

## 2022-06-07 ENCOUNTER — Ambulatory Visit (INDEPENDENT_AMBULATORY_CARE_PROVIDER_SITE_OTHER): Payer: Medicare Other | Admitting: Podiatry

## 2022-06-07 ENCOUNTER — Encounter: Payer: Self-pay | Admitting: Podiatry

## 2022-06-07 DIAGNOSIS — M2041 Other hammer toe(s) (acquired), right foot: Secondary | ICD-10-CM

## 2022-06-07 DIAGNOSIS — L89890 Pressure ulcer of other site, unstageable: Secondary | ICD-10-CM

## 2022-06-07 DIAGNOSIS — M79674 Pain in right toe(s): Secondary | ICD-10-CM | POA: Diagnosis not present

## 2022-06-07 DIAGNOSIS — B351 Tinea unguium: Secondary | ICD-10-CM

## 2022-06-07 DIAGNOSIS — M79675 Pain in left toe(s): Secondary | ICD-10-CM

## 2022-06-07 DIAGNOSIS — E119 Type 2 diabetes mellitus without complications: Secondary | ICD-10-CM

## 2022-06-07 DIAGNOSIS — E1142 Type 2 diabetes mellitus with diabetic polyneuropathy: Secondary | ICD-10-CM

## 2022-06-07 DIAGNOSIS — M2042 Other hammer toe(s) (acquired), left foot: Secondary | ICD-10-CM

## 2022-06-07 MED ORDER — DOXYCYCLINE HYCLATE 100 MG PO CAPS: 100.0000 mg | ORAL_CAPSULE | Freq: Two times a day (BID) | ORAL | 0 refills | Status: AC

## 2022-06-07 MED ORDER — MUPIROCIN 2 % EX OINT: TOPICAL_OINTMENT | CUTANEOUS | 1 refills | Status: DC

## 2022-06-07 NOTE — Patient Instructions (Signed)
Pressure Injury  A pressure injury is damage to the skin and underlying tissue that results from pressure being applied to an area of the body. It often affects people who must spend a long time in a bed or chair because of a medical condition. Pressure injuries usually occur: Over bony parts of the body, such as the tailbone, shoulders, elbows, hips, heels, spine, ankles, and back of the head. Under medical devices that make contact with the body, such as respiratory equipment, stockings, tubes, and splints. Pressure injuries start as reddened areas on the skin and can lead to pain and an open wound. What are the causes? This condition is caused by frequent or constant pressure to an area of the body. Decreased blood flow to the skin can eventually cause the skin tissue to die and break down, causing a wound. What increases the risk? You are more likely to develop this condition if you: Are in the hospital or an extended care facility. Are bedridden or in a wheelchair. Have an injury or disease that keeps you from: Moving normally. Feeling pain or pressure. Have a condition that: Makes you sleepy or less alert. Causes poor blood flow. Need to wear a medical device. Have poor control of your bladder or bowel functions (incontinence). Have poor nutrition (malnutrition). If you are at risk for pressure injuries, your health care provider may recommend certain types of mattresses, mattress covers, pillows, cushions, or boots to help prevent them. These may include products filled with air, foam, gel, or sand. What are the signs or symptoms? Symptoms of this condition depend on the severity of the injury. Symptoms may include: Red or dark areas of the skin. Pain, warmth, or a change of skin texture. Blisters. An open wound. How is this diagnosed? This condition is diagnosed with a medical history and physical exam. You may also have tests, such as: Blood tests. Imaging tests. Blood flow  tests. Your pressure injury will be staged based on its severity. Staging is based on: The depth of the tissue injury, including whether there is exposure of muscle, bone, or tendon. The cause of the pressure injury. How is this treated? This condition may be treated by: Relieving or redistributing pressure on your skin. This includes: Frequently changing your position. Avoiding positions that caused the wound or that can make the wound worse. Using specific bed mattresses, chair cushions, or protective boots. Moving medical devices from an area of pressure, or placing padding between the skin and the device. Using foams, creams, or powders to prevent rubbing (friction) on the skin. Keeping your skin clean and dry. This may include using a skin cleanser or skin barrier as told by your health care provider. Cleaning your injury and removing any dead tissue from the wound (debridement). Placing a bandage (dressing) over your injury. Using medicines for pain or to prevent or treat infection. Surgery may be needed if other treatments are not working or if your injury is very deep. Follow these instructions at home: Wound care Follow instructions from your health care provider about how to take care of your wound. Make sure you: Wash your hands with soap and water before and after you change your bandage (dressing). If soap and water are not available, use hand sanitizer. Change your dressing as told by your health care provider.  Check your wound every day for signs of infection. Have a caregiver do this for you if you are not able. Check for: Redness, swelling, or increased pain. More  fluid or blood. Warmth. Pus or a bad smell. Skin care Keep your skin clean and dry. Gently pat your skin dry. Do not rub or massage your skin. You or a caregiver should check your skin every day for any changes in color or any new blisters or sores (ulcers). Medicines Take over-the-counter and prescription  medicines only as told by your health care provider. If you were prescribed an antibiotic medicine, take or apply it as told by your health care provider. Do not stop using the antibiotic even if your condition improves. Reducing and redistributing pressure Do not lie or sit in one position for a long time. Move or change position every 1-2 hours, or as told by your health care provider. Use pillows or cushions to reduce pressure. Ask your health care provider to recommend cushions or pads for you. General instructions  Eat a healthy diet that includes lots of protein. Drink enough fluid to keep your urine pale yellow. Be as active as you can every day. Ask your health care provider to suggest safe exercises or activities. Do not abuse drugs or alcohol. Do not use any products that contain nicotine or tobacco, such as cigarettes, e-cigarettes, and chewing tobacco. If you need help quitting, ask your health care provider. Keep all follow-up visits as told by your health care provider. This is important. Contact a health care provider if: You have: A fever or chills. Pain that is not helped by medicine. Any changes in skin color. New blisters or sores. Pus or a bad smell coming from your wound. Redness, swelling, or pain around your wound. More fluid or blood coming from your wound. Your wound does not improve after 1-2 weeks of treatment. Summary A pressure injury is damage to the skin and underlying tissue that results from pressure being applied to an area of the body. Do not lie or sit in one position for a long time. Your health care provider may advise you to move or change position every 1-2 hours. Follow instructions from your health care provider about how to take care of your wound. Keep all follow-up visits as told by your health care provider. This is important. This information is not intended to replace advice given to you by your health care provider. Make sure you discuss  any questions you have with your health care provider. Document Revised: 06/02/2021 Document Reviewed: 06/02/2021 Elsevier Patient Education  [place].

## 2022-06-07 NOTE — Telephone Encounter (Signed)
Will forward to provider  Pt seen pcp in 10/23 for a cough

## 2022-06-11 NOTE — Progress Notes (Signed)
ANNUAL DIABETIC FOOT EXAM  Subjective: [name] presents today for annual diabetic foot examination. Patient states she started dialysis treatments on January 26, 2022. Her dialysis days are MWF. Patient concerned about discoloration of left great toe. Denies any pain or recent injury.   Chief Complaint  Patient presents with   Nail Problem    Diabetic foot care BS-99 A1C-do not know PCP-Michelle Edwards PCP VST -Last week   Patient confirms h/o diabetes.  Patient denies any h/o foot wounds.  Patient endorses numbness, tingling, and burning in her feet.  Patient has been diagnosed with neuropathy.  Risk factors: diabetes, diabetic neuropathy, h/o DVT, HTN, COPD, CHF, CKD, hyperlipidemia.  [name], NP is patient's PCP. Past Medical History:  Diagnosis Date   Acute on chronic systolic CHF (congestive heart failure) (HCC) 12/21/2020   Allergic rhinitis    Arthritis    Asthma    Chronic diastolic CHF (congestive heart failure) (HCC)    COPD (chronic obstructive pulmonary disease) (HCC)    Depression    DM (diabetes mellitus) ([place])    DVT (deep vein thrombosis) in pregnancy    GERD (gastroesophageal reflux disease)    HTN (hypertension)    Hyperlipidemia    Obesity    Pneumonia 04/2018   RIGHT LOBE   Sleep apnea    compliant with CPAP   Patient Active Problem List   Diagnosis Date Noted   Acute kidney injury superimposed on chronic kidney disease ([place]) 03/18/2022   Class 3 obesity ([place]) 03/18/2022   Depression 01/21/2022   Chronic deep vein thrombosis (DVT) of other vein of right upper extremity ([place]) 12/29/2021   Abnormal weight loss 11/16/2021   Personal history of colonic polyps 11/16/2021   Rib pain 11/16/2021   Anxiety and depression 08/26/2021   Essential hypertension 08/26/2021   GERD (gastroesophageal reflux disease) 08/26/2021   COPD (chronic obstructive pulmonary disease) ([place]) 07/29/2021   Biventricular heart failure (HCC)    Atrial  flutter ([place])    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   Encounter for preoperative dental examination    Teeth missing    Gingivitis    Accretions on teeth    Atrial fibrillation, permanent ([place]) 11/02/2020   Acute on chronic right heart failure ([place]) 05/06/2020   NICM (nonischemic cardiomyopathy) ([place]) 04/01/2020   Family history of heart disease 04/01/2020   Mitral regurgitation 04/01/2020   Acute on chronic left systolic heart failure ([place]) 07/01/2018   Acute on chronic diastolic CHF (congestive heart failure) ([place]) 07/01/2018   Chronic atrial fibrillation ([place]) 18/29/9371   Diastolic dysfunction 69/67/8938   Diabetes mellitus type 2 in obese ([place]) 12/26/2017   Hypoxia 09/21/2011   Type 2 diabetes mellitus with hyperlipidemia ([place]) 02/04/2010   AF (paroxysmal atrial fibrillation) ([place]) 02/04/2010   Gastroparesis 02/04/2010   Past Surgical History:  Procedure Laterality Date   ABDOMINAL HYSTERECTOMY     AV FISTULA PLACEMENT Left 03/17/2022   Procedure: LEFT ARM ARTERIOVENOUS (AV) FISTULA CREATION;  Surgeon: Angelia Mould, MD;  Location: [place];  Service: Vascular;  Laterality: Left;   BUBBLE STUDY  11/26/2020   Procedure: BUBBLE STUDY;  Surgeon: Buford Dresser, MD;  Location: [place];  Service: Cardiovascular;;   CARDIOVERSION N/A 05/06/2020   Procedure: CARDIOVERSION;  Surgeon: Buford Dresser, MD;  Location: [facility] ENDOSCOPY;  Service: Cardiovascular;  Laterality: N/A;   CARDIOVERSION N/A 11/04/2020   Procedure: CARDIOVERSION;  Surgeon: Lelon Perla, MD;  Location: [facility] ENDOSCOPY;  Service: Cardiovascular;  Laterality: N/A;   CARDIOVERSION N/A 02/01/2021   Procedure: CARDIOVERSION;  Surgeon: Jolaine Artist, MD;  Location: [place];  Service: Cardiovascular;  Laterality: N/A;   CARDIOVERSION N/A 02/09/2021   Procedure: CARDIOVERSION;  Surgeon: Jolaine Artist, MD;  Location: [place];  Service:  Cardiovascular;  Laterality: N/A;   CARDIOVERSION N/A 04/19/2021   Procedure: CARDIOVERSION;  Surgeon: Fay Records, MD;  Location: [place];  Service: Cardiovascular;  Laterality: N/A;   CARDIOVERSION N/A 11/25/2021   Procedure: CARDIOVERSION;  Surgeon: Jolaine Artist, MD;  Location: [place];  Service: Cardiovascular;  Laterality: N/A;   CARDIOVERSION N/A 01/31/2022   Procedure: CARDIOVERSION;  Surgeon: Jolaine Artist, MD;  Location: [place];  Service: Cardiovascular;  Laterality: N/A;   CATARACT EXTRACTION, BILATERAL     CHOLECYSTECTOMY     COLONOSCOPY WITH PROPOFOL N/A 03/05/2015   Procedure: COLONOSCOPY WITH PROPOFOL;  Surgeon: Carol Ada, MD;  Location: [facility] ENDOSCOPY;  Service: Endoscopy;  Laterality: N/A;   DIAGNOSTIC LAPAROSCOPY     FISTULA SUPERFICIALIZATION Left 03/17/2022   Procedure: FISTULA SUPERFICIALIZATION -LEFT;  Surgeon: Angelia Mould, MD;  Location: [facility] OR;  Service: Vascular;  Laterality: Left;   ingrown hallux Left    IR FLUORO GUIDE CV LINE RIGHT  03/14/2022   IR US GUIDE VASC ACCESS RIGHT  03/14/2022   KNEE SURGERY     LEFT HEART CATH AND CORONARY ANGIOGRAPHY N/A 12/31/2017   Procedure: LEFT HEART CATH AND CORONARY ANGIOGRAPHY;  Surgeon: Charolette Forward, MD;  Location: [place] CV LAB;  Service: Cardiovascular;  Laterality: N/A;   MAZE N/A 12/29/2020   Procedure: MAZE;  Surgeon: [name], MD;  Location: [place];  Service: Open Heart Surgery;  Laterality: N/A;   MITRAL VALVE REPAIR N/A 12/29/2020   Procedure: MITRAL VALVE REPAIR USING CARBOMEDICS ANNULOFLEX RING SIZE 30;  Surgeon: [name], MD;  Location: [place];  Service: Open Heart Surgery;  Laterality: N/A;   RIGHT HEART CATH N/A 12/22/2020   Procedure: RIGHT HEART CATH;  Surgeon: Larey Dresser, MD;  Location: [place] CV LAB;  Service: Cardiovascular;  Laterality: N/A;   RIGHT HEART CATH N/A 01/31/2021   Procedure: RIGHT HEART CATH;  Surgeon: Jolaine Artist,  MD;  Location: [place] CV LAB;  Service: Cardiovascular;  Laterality: N/A;   RIGHT HEART CATH N/A 07/29/2021   Procedure: RIGHT HEART CATH;  Surgeon: Jolaine Artist, MD;  Location: [place] CV LAB;  Service: Cardiovascular;  Laterality: N/A;   RIGHT HEART CATH N/A 11/24/2021   Procedure: RIGHT HEART CATH;  Surgeon: Jolaine Artist, MD;  Location: [place] CV LAB;  Service: Cardiovascular;  Laterality: N/A;   RIGHT HEART CATH N/A 02/14/2022   Procedure: RIGHT HEART CATH;  Surgeon: Jolaine Artist, MD;  Location: [place] CV LAB;  Service: Cardiovascular;  Laterality: N/A;   RIGHT HEART CATH N/A 02/22/2022   Procedure: RIGHT HEART CATH;  Surgeon: Larey Dresser, MD;  Location: [place] CV LAB;  Service: Cardiovascular;  Laterality: N/A;   TEE WITHOUT CARDIOVERSION N/A 11/26/2020   Procedure: TRANSESOPHAGEAL ECHOCARDIOGRAM (TEE);  Surgeon: Buford Dresser, MD;  Location[facility]COPY;  Service: Cardiovascular;  Laterality: N/A;   TEE WITHOUT CARDIOVERSION N/A 12/29/2020   Procedure: TRANSESOPHAGEAL ECHOCARDIOGRAM (TEE);  Surgeon: [name], MD;  Location: [place];  Service: Open Heart Surgery;  Laterality: N/A;   TEE WITHOUT CARDIOVERSION N/A 01/20/2022   Procedure: TRANSESOPHAGEAL ECHOCARDIOGRAM (TEE);  Surgeon: Glori Bickers  R, MD;  Location: [place];  Service: Cardiovascular;  Laterality: N/A;   TRICUSPID VALVE REPLACEMENT N/A 12/29/2020   Procedure: 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 RING SIZE 34;  Surgeon: [name], MD;  Location: [place];  Service: Open Heart Surgery;  Laterality: N/A;   Current Outpatient Medications on File Prior to Visit  Medication Sig Dispense Refill   acetaminophen (TYLENOL) 325 MG tablet Take 2 tablets (650 mg total) by mouth every 6 (six) hours as needed for mild pain or moderate pain.     albuterol (VENTOLIN HFA) 108 (90 Base) MCG/ACT inhaler INHALE 1-2 PUFFS BY MOUTH EVERY 6 HOURS AS NEEDED FOR  WHEEZE OR SHORTNESS OF BREATH 6.7 each 1   amiodarone (PACERONE) 200 MG tablet TAKE 1 TABLET BY MOUTH TWICE A DAY 180 tablet 3   apixaban (ELIQUIS) 5 MG TABS tablet TAKE ONE TABLET BY MOUTH TWICE DAILY at [place] (Patient taking differently: Take 5 mg by mouth in the morning and at bedtime.) 60 tablet 1   benzonatate (TESSALON PERLES) 100 MG capsule Take 1 capsule (100 mg total) by mouth 3 (three) times daily as needed for cough. 20 capsule 0   busPIRone (BUSPAR) 5 MG tablet Take 1 tablet (5 mg total) by mouth 2 (two) times daily. 180 tablet 1   colchicine 0.6 MG tablet TAKE 0.5 TABLETS BY MOUTH DAILY. 45 tablet 0   diphenhydrAMINE (BENADRYL) 25 mg capsule Take 25 mg by mouth every 6 (six) hours as needed for allergies or itching.     DULoxetine (CYMBALTA) 60 MG capsule Take 1 capsule (60 mg total) by mouth daily. 180 capsule 0   EPINEPHrine 0.3 mg/0.3 mL IJ SOAJ injection Inject 0.3 mg into the muscle as needed for anaphylaxis. 1 each 1   fluticasone (FLONASE) 50 MCG/ACT nasal spray Place 2 sprays into both nostrils daily as needed for allergies or rhinitis. 16 g 3   glucose blood (ACCU-CHEK GUIDE) test strip USE TO CHECK BLOOD SUGAR 3 TIMES DAILY 100 strip 3   guaiFENesin 200 MG tablet Take 1 tablet (200 mg total) by mouth every 4 (four) hours as needed for cough or to loosen phlegm. 30 suppository 0   insulin aspart (NOVOLOG) 100 UNIT/ML injection Inject 2-10 Units into the skin 3 (three) times daily before meals. Per sliding scale (Patient taking differently: Inject 2-10 Units into the skin See admin instructions. Inject 2-10 units into the skin three times a day before meals, per sliding scale)     isosorbide mononitrate (IMDUR) 30 MG 24 hr tablet Take 0.5 tablets (15 mg total) by mouth daily. 15 tablet 0   lidocaine (XYLOCAINE) 5 % ointment Apply 1 application. topically as needed for moderate pain. First choice is lidocaine patch, but if not covered/expensive, please fill ointment (Patient  taking differently: Apply 1 application  topically 2 (two) times daily as needed for moderate pain.) 35.44 g 3   lidocaine-prilocaine (EMLA) cream SMARTSIG:sparingly Topical 3 Times a Week     LINZESS 72 MCG capsule TAKE 1 CAPSULE (72 MCG TOTAL) BY MOUTH DAILY BEFORE BREAKFAST. IF BM NOT ACHIEVE WITH 1 CAPSULE SHE MAY TAKE 2 DAILY. STOP IF DIARRHEA OCCURS 60 capsule 1   loratadine (CLARITIN) 10 MG tablet Take 1 tablet (10 mg total) by mouth daily. 30 tablet 11   metoprolol succinate (TOPROL-XL) 25 MG 24 hr tablet Take 1 tablet (25 mg total) by mouth daily. 30 tablet 0   Multiple Vitamins-Minerals (MULTIVITAMIN WOMEN PO) Take  1 tablet by mouth daily.     nitroGLYCERIN (NITROSTAT) 0.4 MG SL tablet Place 1 tablet (0.4 mg total) under the tongue every 5 (five) minutes x 3 doses as needed for chest pain. 25 tablet 12   pantoprazole (PROTONIX) 40 MG tablet TAKE ONE TABLET BY MOUTH DAILY AT 9AM 90 tablet 11   polyethylene glycol powder (GLYCOLAX/MIRALAX) 17 GM/SCOOP powder Take 17 g by mouth daily. (Patient taking differently: Take 17 g by mouth daily as needed for mild constipation.) 238 g 0   pregabalin (LYRICA) 100 MG capsule TAKE 1 CAPSULE BY MOUTH THREE TIMES A DAY (Patient taking differently: Take 100 mg by mouth 3 (three) times daily as needed (for pain).) 90 capsule 3   RESTASIS 0.05 % ophthalmic emulsion Place 1 drop into both eyes in the morning and at bedtime.  12   rosuvastatin (CRESTOR) 10 MG tablet TAKE ONE TABLET BY MOUTH DAILY AT 5PM 90 tablet 0   senna-docusate (SENOKOT-S) 8.6-50 MG tablet Take 1 tablet by mouth 2 (two) times daily. 14 tablet 0   torsemide (DEMADEX) 100 MG tablet Take 1 tablet by mouth. Monday Wednesday and Friday     TRULICITY 1.5 UL/8.4TX SOPN INJECT 1.5 MG INTO THE SKIN ONCE A WEEK. (Patient taking differently: Inject 1.5 mg into the skin every Wednesday.) 0.5 mL 2   No current facility-administered medications on file prior to visit.    Allergies  Allergen Reactions    Bee Pollen Anaphylaxis and Other (See Comments)    UNCONFIRMED, but IS allergic to bee VENOM   Bee Venom Anaphylaxis   Sulfa Antibiotics Itching   Social History   Occupational History   Not on file  Tobacco Use   Smoking status: Never    Passive exposure: Never   Smokeless tobacco: Never  Vaping Use   Vaping Use: Never used  Substance and Sexual Activity   Alcohol use: No   Drug use: No   Sexual activity: Not Currently   Family History  Problem Relation Age of Onset   Breast cancer Mother    Cancer Mother    Hypertension Mother    Cancer Father    Hypertension Sister    Hypertension Brother    CAD Other    Immunization History  Administered Date(s) Administered   Influenza,inj,Quad PF,6+ Mos 05/14/2019, 06/24/2020, 05/04/2021   PFIZER(Purple Top)SARS-COV-2 Vaccination 11/13/2019, 12/04/2019   Pneumococcal Polysaccharide-23 02/27/2012   Tdap 03/04/2019     Review of Systems: Negative except as noted in the HPI.   Objective: There were no vitals filed for this visit.  [name] is a pleasant 66 y.o. female in NAD. AAO X 3.  Vascular Examination: CFT <3 seconds b/l LE. Palpable pedal pulses b/l LE. Pedal hair sparse. No pain with calf compression b/l. No pain with calf compression RLE. Lower extremity skin temperature gradient within normal limits. No ischemia or gangrene noted b/l LE. No cyanosis or clubbing noted b/l LE.  Dermatological Examination: Area of pressure noted b/l unstageable. No edema, no drainage, no fluctuance, no warmth nor abnormal coolness. Pedal integument with normal turgor, texture and tone BLE. No open wounds b/l LE. No interdigital macerations noted b/l LE. Toenails 1-5 b/l elongated, discolored, dystrophic, thickened, crumbly with subungual debris and tenderness to dorsal palpation. No hyperkeratotic nor porokeratotic lesions present on today's visit.  Neurological Examination: Protective sensation intact 5/5 intact bilaterally with  10g monofilament b/l. Vibratory sensation intact b/l.  Musculoskeletal Examination: Muscle strength 5/5 to all lower extremity  muscle groups bilaterally. No pain, crepitus or joint limitation noted with ROM bilateral LE. HAV with bunion bilaterally and hammertoes 2-5 b/l.  Footwear Assessment: Does the patient wear appropriate shoes? Yes. Does the patient need inserts/orthotics? No.  ADA Risk Categorization:  High Risk  Patient has one or more of the following: Loss of protective sensation Absent pedal pulses Severe Foot deformity History of foot ulcer  Assessment: 1. Pain due to onychomycosis of toenails of both feet   2. Pressure injury of left foot, unstageable ([place])   3. Pressure injury of right foot, unstageable ([place])   4. Acquired hammertoes of both feet   5. Diabetic peripheral neuropathy associated with type 2 diabetes mellitus ([place])   6. Encounter for diabetic foot exam [facility])     Plan: -Patient was evaluated and treated. All patient's and/or POA's questions/concerns answered on today's visit. -Discussed pressure injuries b/l feet which appear to be resolving. Will place on antibiotics for 7 days. -Diabetic foot examination performed today. -Continue diabetic foot care principles: inspect feet daily, monitor glucose as recommended by PCP and/or Endocrinologist, and follow prescribed diet per PCP, Endocrinologist and/or dietician. -Mycotic toenails 1-5 bilaterally were debrided in length and girth with sterile nail nippers and dremel without incident. -Prescription written for Mupirocin Ointment. Patient is to apply to both feet once daily and cover with dressing.  -Rx for Doxycyline 100 mg, #14, to be taken one capsule twice daily for 7 days. -Patient/POA to call should there be question/concern in the interim. Return in about 3 months (around 09/07/2022).  Marzetta Board, DPM

## 2022-06-20 ENCOUNTER — Encounter (HOSPITAL_COMMUNITY): Payer: Self-pay

## 2022-06-20 ENCOUNTER — Other Ambulatory Visit: Payer: Self-pay

## 2022-06-20 ENCOUNTER — Inpatient Hospital Stay (HOSPITAL_BASED_OUTPATIENT_CLINIC_OR_DEPARTMENT_OTHER)
Admission: EM | Admit: 2022-06-20 | Discharge: 2022-06-25 | DRG: 193 | Disposition: A | Discharge status: Home / Self Care | Payer: Medicare Other | Attending: Internal Medicine | Admitting: Internal Medicine

## 2022-06-20 ENCOUNTER — Emergency Department (HOSPITAL_BASED_OUTPATIENT_CLINIC_OR_DEPARTMENT_OTHER): Payer: Medicare Other | Admitting: Radiology

## 2022-06-20 ENCOUNTER — Encounter (HOSPITAL_BASED_OUTPATIENT_CLINIC_OR_DEPARTMENT_OTHER): Payer: Self-pay

## 2022-06-20 DIAGNOSIS — D631 Anemia in chronic kidney disease: Secondary | ICD-10-CM | POA: Diagnosis present

## 2022-06-20 DIAGNOSIS — I132 Hypertensive heart and chronic kidney disease with heart failure and with stage 5 chronic kidney disease, or end stage renal disease: Secondary | ICD-10-CM | POA: Diagnosis present

## 2022-06-20 DIAGNOSIS — Z882 Allergy status to sulfonamides status: Secondary | ICD-10-CM

## 2022-06-20 DIAGNOSIS — R042 Hemoptysis: Secondary | ICD-10-CM | POA: Diagnosis present

## 2022-06-20 DIAGNOSIS — Z803 Family history of malignant neoplasm of breast: Secondary | ICD-10-CM

## 2022-06-20 DIAGNOSIS — E66813 Obesity, class 3: Secondary | ICD-10-CM | POA: Diagnosis present

## 2022-06-20 DIAGNOSIS — F419 Anxiety disorder, unspecified: Secondary | ICD-10-CM | POA: Diagnosis present

## 2022-06-20 DIAGNOSIS — E669 Obesity, unspecified: Secondary | ICD-10-CM | POA: Diagnosis present

## 2022-06-20 DIAGNOSIS — I480 Paroxysmal atrial fibrillation: Secondary | ICD-10-CM | POA: Diagnosis present

## 2022-06-20 DIAGNOSIS — I10 Essential (primary) hypertension: Secondary | ICD-10-CM | POA: Diagnosis present

## 2022-06-20 DIAGNOSIS — Z8249 Family history of ischemic heart disease and other diseases of the circulatory system: Secondary | ICD-10-CM

## 2022-06-20 DIAGNOSIS — Y95 Nosocomial condition: Secondary | ICD-10-CM | POA: Diagnosis present

## 2022-06-20 DIAGNOSIS — M898X9 Other specified disorders of bone, unspecified site: Secondary | ICD-10-CM | POA: Diagnosis present

## 2022-06-20 DIAGNOSIS — J449 Chronic obstructive pulmonary disease, unspecified: Secondary | ICD-10-CM | POA: Diagnosis present

## 2022-06-20 DIAGNOSIS — Z794 Long term (current) use of insulin: Secondary | ICD-10-CM

## 2022-06-20 DIAGNOSIS — I5082 Biventricular heart failure: Secondary | ICD-10-CM | POA: Diagnosis present

## 2022-06-20 DIAGNOSIS — Z7985 Long-term (current) use of injectable non-insulin antidiabetic drugs: Secondary | ICD-10-CM

## 2022-06-20 DIAGNOSIS — R5381 Other malaise: Secondary | ICD-10-CM

## 2022-06-20 DIAGNOSIS — K219 Gastro-esophageal reflux disease without esophagitis: Secondary | ICD-10-CM | POA: Diagnosis present

## 2022-06-20 DIAGNOSIS — Z6841 Body Mass Index (BMI) 40.0 and over, adult: Secondary | ICD-10-CM

## 2022-06-20 DIAGNOSIS — J9601 Acute respiratory failure with hypoxia: Secondary | ICD-10-CM | POA: Diagnosis present

## 2022-06-20 DIAGNOSIS — I482 Chronic atrial fibrillation, unspecified: Secondary | ICD-10-CM | POA: Diagnosis present

## 2022-06-20 DIAGNOSIS — E1122 Type 2 diabetes mellitus with diabetic chronic kidney disease: Secondary | ICD-10-CM | POA: Diagnosis present

## 2022-06-20 DIAGNOSIS — K5909 Other constipation: Secondary | ICD-10-CM | POA: Diagnosis present

## 2022-06-20 DIAGNOSIS — I5042 Chronic combined systolic (congestive) and diastolic (congestive) heart failure: Secondary | ICD-10-CM | POA: Diagnosis present

## 2022-06-20 DIAGNOSIS — G4733 Obstructive sleep apnea (adult) (pediatric): Secondary | ICD-10-CM

## 2022-06-20 DIAGNOSIS — J121 Respiratory syncytial virus pneumonia: Principal | ICD-10-CM | POA: Diagnosis present

## 2022-06-20 DIAGNOSIS — F32A Depression, unspecified: Secondary | ICD-10-CM | POA: Diagnosis present

## 2022-06-20 DIAGNOSIS — Z91030 Bee allergy status: Secondary | ICD-10-CM

## 2022-06-20 DIAGNOSIS — Z79899 Other long term (current) drug therapy: Secondary | ICD-10-CM

## 2022-06-20 DIAGNOSIS — Z7901 Long term (current) use of anticoagulants: Secondary | ICD-10-CM

## 2022-06-20 DIAGNOSIS — Z992 Dependence on renal dialysis: Secondary | ICD-10-CM

## 2022-06-20 DIAGNOSIS — N186 End stage renal disease: Secondary | ICD-10-CM

## 2022-06-20 DIAGNOSIS — E1169 Type 2 diabetes mellitus with other specified complication: Secondary | ICD-10-CM | POA: Diagnosis present

## 2022-06-20 DIAGNOSIS — Z9889 Other specified postprocedural states: Secondary | ICD-10-CM

## 2022-06-20 DIAGNOSIS — Z9049 Acquired absence of other specified parts of digestive tract: Secondary | ICD-10-CM

## 2022-06-20 DIAGNOSIS — E785 Hyperlipidemia, unspecified: Secondary | ICD-10-CM | POA: Diagnosis present

## 2022-06-20 DIAGNOSIS — E1142 Type 2 diabetes mellitus with diabetic polyneuropathy: Secondary | ICD-10-CM | POA: Diagnosis present

## 2022-06-20 DIAGNOSIS — Z90710 Acquired absence of both cervix and uterus: Secondary | ICD-10-CM

## 2022-06-20 DIAGNOSIS — N2581 Secondary hyperparathyroidism of renal origin: Secondary | ICD-10-CM | POA: Diagnosis present

## 2022-06-20 DIAGNOSIS — J189 Pneumonia, unspecified organism: Secondary | ICD-10-CM

## 2022-06-20 DIAGNOSIS — J440 Chronic obstructive pulmonary disease with acute lower respiratory infection: Secondary | ICD-10-CM | POA: Diagnosis present

## 2022-06-20 DIAGNOSIS — Z86718 Personal history of other venous thrombosis and embolism: Secondary | ICD-10-CM

## 2022-06-20 DIAGNOSIS — I82721 Chronic embolism and thrombosis of deep veins of right upper extremity: Secondary | ICD-10-CM | POA: Diagnosis present

## 2022-06-20 DIAGNOSIS — Z1152 Encounter for screening for COVID-19: Secondary | ICD-10-CM

## 2022-06-20 LAB — CBC WITH DIFFERENTIAL/PLATELET
Abs Immature Granulocytes: 0.03 10*3/uL (ref 0.00–0.07)
Basophils Absolute: 0 10*3/uL (ref 0.0–0.1)
Basophils Relative: 0 %
Eosinophils Absolute: 0.2 10*3/uL (ref 0.0–0.5)
Eosinophils Relative: 4 %
HCT: 32.1 % — ABNORMAL LOW (ref 36.0–46.0)
Hemoglobin: 10.3 g/dL — ABNORMAL LOW (ref 12.0–15.0)
Immature Granulocytes: 1 %
Lymphocytes Relative: 21 %
Lymphs Abs: 1.2 10*3/uL (ref 0.7–4.0)
MCH: 30.3 pg (ref 26.0–34.0)
MCHC: 32.1 g/dL (ref 30.0–36.0)
MCV: 94.4 fL (ref 80.0–100.0)
Monocytes Absolute: 1 10*3/uL (ref 0.1–1.0)
Monocytes Relative: 18 %
Neutro Abs: 3.2 10*3/uL (ref 1.7–7.7)
Neutrophils Relative %: 56 %
Platelets: 182 10*3/uL (ref 150–400)
RBC: 3.4 MIL/uL — ABNORMAL LOW (ref 3.87–5.11)
RDW: 14.8 % (ref 11.5–15.5)
WBC: 5.7 10*3/uL (ref 4.0–10.5)
nRBC: 0 % (ref 0.0–0.2)

## 2022-06-20 LAB — COMPREHENSIVE METABOLIC PANEL
ALT: 20 U/L (ref 0–44)
AST: 21 U/L (ref 15–41)
Albumin: 4 g/dL (ref 3.5–5.0)
Alkaline Phosphatase: 105 U/L (ref 38–126)
Anion gap: 8 (ref 5–15)
BUN: 27 mg/dL — ABNORMAL HIGH (ref 8–23)
CO2: 34 mmol/L — ABNORMAL HIGH (ref 22–32)
Calcium: 9.4 mg/dL (ref 8.9–10.3)
Chloride: 94 mmol/L — ABNORMAL LOW (ref 98–111)
Creatinine, Ser: 5.38 mg/dL — ABNORMAL HIGH (ref 0.44–1.00)
GFR, Estimated: 8 mL/min — ABNORMAL LOW (ref >60)
Glucose, Bld: 125 mg/dL — ABNORMAL HIGH (ref 70–99)
Potassium: 3.3 mmol/L — ABNORMAL LOW (ref 3.5–5.1)
Sodium: 136 mmol/L (ref 135–145)
Total Bilirubin: 0.6 mg/dL (ref 0.3–1.2)
Total Protein: 8.8 g/dL — ABNORMAL HIGH (ref 6.5–8.1)

## 2022-06-20 LAB — I-STAT VENOUS BLOOD GAS, ED
Acid-Base Excess: 9 mmol/L — ABNORMAL HIGH (ref 0.0–2.0)
Bicarbonate: 34.9 mmol/L — ABNORMAL HIGH (ref 20.0–28.0)
Calcium, Ion: 1.17 mmol/L (ref 1.15–1.40)
HCT: 34 % — ABNORMAL LOW (ref 36.0–46.0)
Hemoglobin: 11.6 g/dL — ABNORMAL LOW (ref 12.0–15.0)
O2 Saturation: 38 %
Patient temperature: 98.9
Potassium: 3.4 mmol/L — ABNORMAL LOW (ref 3.5–5.1)
Sodium: 138 mmol/L (ref 135–145)
TCO2: 37 mmol/L — ABNORMAL HIGH (ref 22–32)
pCO2, Ven: 56.5 mmHg (ref 44–60)
pH, Ven: 7.4 (ref 7.25–7.43)
pO2, Ven: 23 mmHg — CL (ref 32–45)

## 2022-06-20 LAB — MRSA NEXT GEN BY PCR, NASAL: MRSA by PCR Next Gen: NOT DETECTED

## 2022-06-20 LAB — TROPONIN I (HIGH SENSITIVITY)
Troponin I (High Sensitivity): 7 ng/L (ref <18)
Troponin I (High Sensitivity): 6 ng/L (ref <18)
Troponin I (High Sensitivity): 6 ng/L (ref <18)

## 2022-06-20 LAB — SARS CORONAVIRUS 2 BY RT PCR: SARS Coronavirus 2 by RT PCR: NEGATIVE

## 2022-06-20 LAB — BRAIN NATRIURETIC PEPTIDE: B Natriuretic Peptide: 191.7 pg/mL — ABNORMAL HIGH (ref 0.0–100.0)

## 2022-06-20 MED ORDER — MORPHINE SULFATE (PF) 4 MG/ML IV SOLN
4.0000 mg | Freq: Once | INTRAVENOUS | Status: AC
  Administered 2022-06-20: 4 mg via INTRAVENOUS
  Filled 2022-06-20: qty 1

## 2022-06-20 MED ORDER — VANCOMYCIN HCL 10 G IV SOLR
2500.0000 mg | Freq: Once | INTRAVENOUS | Status: AC
  Administered 2022-06-20: 2500 mg via INTRAVENOUS
  Filled 2022-06-20 (×2): qty 25

## 2022-06-20 MED ORDER — SODIUM CHLORIDE 0.9 % IV SOLN
1.0000 g | INTRAVENOUS | Status: DC
  Administered 2022-06-20 – 2022-06-24 (×5): 1 g via INTRAVENOUS
  Filled 2022-06-20 (×5): qty 10

## 2022-06-20 MED ORDER — IPRATROPIUM-ALBUTEROL 0.5-2.5 (3) MG/3ML IN SOLN
3.0000 mL | Freq: Once | RESPIRATORY_TRACT | Status: AC
  Administered 2022-06-20: 3 mL via RESPIRATORY_TRACT
  Filled 2022-06-20: qty 3

## 2022-06-20 MED ORDER — ALBUTEROL SULFATE (2.5 MG/3ML) 0.083% IN NEBU
5.0000 mg | INHALATION_SOLUTION | Freq: Once | RESPIRATORY_TRACT | Status: AC
  Administered 2022-06-20: 5 mg via RESPIRATORY_TRACT
  Filled 2022-06-20: qty 6

## 2022-06-20 NOTE — ED Notes (Signed)
Patient transported to X-ray 

## 2022-06-20 NOTE — ED Notes (Signed)
RT note: Pt. ambulated on room air per order along with RN with w/c in tow if needed, results are as follows: Rest: P-94 / Sats-96% Start: P-92 / Sats-90% Finish:P-95 / Sats-88% Pt. stated that she is more S.O.B. ambulating than usual when finished, RT noted >'d wob along with some unsteadiness before pt. was seated back in w/c after ~ only 1/2 way down hall, MD made aware.

## 2022-06-20 NOTE — ED Provider Notes (Signed)
[facility] EMERGENCY DEPT Provider Note   CSN: [id number] Arrival date & time: 06/20/22  1143     History  Chief Complaint  Patient presents with   Chest Pain   Shortness of Breath    [name] is a 67 y.o. female who presents emergency department with a chief complaint of shortness of breath and chest pain.  She has an extensive past medical history including biventricular heart failure, chronic atrial fibrillation on anticoagulation with Eliquis, obesity, hypertension, diabetes, end-stage renal disease on hemodialysis Tuesday Thursday Saturday with completed treatment this morning.  Patient has been on dialysis for about 4 months.  She was discharged from [facility] 5 days ago after admission for RSV pneumonia.  She was discharged on doxycycline.  She also has been using albuterol.  She has been having worsening cough, wheezing, shortness of breath.  She also complains of sharp left-sided chest pain at work at rest that is worse with deep breathing.  She has had cough productive of green sputum and has had some associated hemoptysis.  She had a fever of 101.8 last night.   The history is provided by the patient. No language interpreter was used.  Chest Pain Associated symptoms: shortness of breath   Shortness of Breath Associated symptoms: chest pain        Home Medications Prior to Admission medications   Medication Sig Start Date End Date Taking? Authorizing Provider  acetaminophen (TYLENOL) 325 MG tablet Take 2 tablets (650 mg total) by mouth every 6 (six) hours as needed for mild pain or moderate pain. 12/07/21  Yes Arrien, Jimmy Picket, MD  albuterol (VENTOLIN HFA) 108 (90 Base) MCG/ACT inhaler INHALE 1-2 PUFFS BY MOUTH EVERY 6 HOURS AS NEEDED FOR WHEEZE OR SHORTNESS OF BREATH 04/12/22  Yes [name], NP  amiodarone (PACERONE) 200 MG tablet TAKE 1 TABLET BY MOUTH TWICE A DAY 04/25/22  Yes Bensimhon, Shaune Pascal, MD  apixaban (ELIQUIS) 5 MG  TABS tablet TAKE ONE TABLET BY MOUTH TWICE DAILY at [place] Patient taking differently: Take 5 mg by mouth in the morning and at bedtime. 02/24/22  Yes Shelly Coss, MD  benzonatate (TESSALON PERLES) 100 MG capsule Take 1 capsule (100 mg total) by mouth 3 (three) times daily as needed for cough. 05/23/22  Yes [name], NP  busPIRone (BUSPAR) 5 MG tablet Take 1 tablet (5 mg total) by mouth 2 (two) times daily. 05/14/22  Yes [name], NP  colchicine 0.6 MG tablet TAKE 0.5 TABLETS BY MOUTH DAILY. 04/03/22  Yes Charlott Rakes, MD  diphenhydrAMINE (BENADRYL) 25 mg capsule Take 25 mg by mouth every 6 (six) hours as needed for allergies or itching.   Yes [provider]  DULoxetine (CYMBALTA) 60 MG capsule Take 1 capsule (60 mg total) by mouth daily. 05/08/22  Yes [name], NP  fluticasone (FLONASE) 50 MCG/ACT nasal spray Place 2 sprays into both nostrils daily as needed for allergies or rhinitis. 04/25/22  Yes [name], NP  insulin aspart (NOVOLOG) 100 UNIT/ML injection Inject 2-10 Units into the skin 3 (three) times daily before meals. Per sliding scale Patient taking differently: Inject 2-10 Units into the skin See admin instructions. Inject 2-10 units into the skin three times a day before meals, per sliding scale 09/02/21  Yes Aline August, MD  isosorbide mononitrate (IMDUR) 30 MG 24 hr tablet Take 0.5 tablets (15 mg total) by mouth daily. 03/18/22 06/20/22 Yes Arrien, Jimmy Picket, MD  KLOR-CON M20 20 MEQ tablet Take 20 mEq by mouth 2 (two) times daily. 04/18/22  Yes [provider]  lidocaine-prilocaine (EMLA) cream SMARTSIG:sparingly Topical 3 Times a Week 06/01/22  Yes [provider]  LINZESS 72 MCG capsule TAKE 1 CAPSULE (72 MCG TOTAL) BY MOUTH DAILY BEFORE BREAKFAST. IF BM NOT ACHIEVE WITH 1 CAPSULE SHE MAY TAKE 2 DAILY. STOP IF DIARRHEA OCCURS 04/25/22  Yes [name], NP  loratadine (CLARITIN) 10 MG tablet Take 1  tablet (10 mg total) by mouth daily. 12/29/21  Yes [name], NP  metolazone (ZAROXOLYN) 2.5 MG tablet Take 2.5 mg by mouth 2 (two) times a week. 04/20/22  Yes [provider]  metoprolol succinate (TOPROL-XL) 25 MG 24 hr tablet Take 1 tablet (25 mg total) by mouth daily. 03/18/22 06/20/22 Yes Arrien, Jimmy Picket, MD  Multiple Vitamins-Minerals (MULTIVITAMIN WOMEN PO) Take 1 tablet by mouth daily.   Yes [provider]  multivitamin (RENA-VIT) TABS tablet Take 1 tablet by mouth daily. 04/27/22  Yes [provider]  nitroGLYCERIN (NITROSTAT) 0.4 MG SL tablet Place 1 tablet (0.4 mg total) under the tongue every 5 (five) minutes x 3 doses as needed for chest pain. 01/08/14  Yes Charolette Forward, MD  pantoprazole (PROTONIX) 40 MG tablet TAKE ONE TABLET BY MOUTH DAILY AT 9AM 04/17/22  Yes [name], NP  polyethylene glycol powder (GLYCOLAX/MIRALAX) 17 GM/SCOOP powder Take 17 g by mouth daily. Patient taking differently: Take 17 g by mouth daily as needed for mild constipation. 02/25/22  Yes Shelly Coss, MD  pregabalin (LYRICA) 100 MG capsule TAKE 1 CAPSULE BY MOUTH THREE TIMES A DAY Patient taking differently: Take 100 mg by mouth 3 (three) times daily as needed (for pain). 02/26/22  Yes [name], NP  RESTASIS 0.05 % ophthalmic emulsion Place 1 drop into both eyes in the morning and at bedtime. 11/19/17  Yes [provider]  rosuvastatin (CRESTOR) 10 MG tablet TAKE ONE TABLET BY MOUTH DAILY AT 5PM 05/18/22  Yes Joette Catching, PA-C  senna-docusate (SENOKOT-S) 8.6-50 MG tablet Take 1 tablet by mouth 2 (two) times daily. 02/24/22  Yes Shelly Coss, MD  torsemide (DEMADEX) 100 MG tablet Take 1 tablet by mouth. Monday Wednesday and Friday 04/02/22  Yes [provider]  TRULICITY 1.5 GB/1.5VV SOPN INJECT 1.5 MG INTO THE SKIN ONCE A WEEK. Patient taking differently: Inject 1.5 mg into the skin every Wednesday. 02/26/22  Yes [name], NP  EPINEPHrine 0.3 mg/0.3 mL IJ SOAJ injection Inject 0.3 mg into the muscle as needed for anaphylaxis. 06/25/20   [name], NP  glucose blood (ACCU-CHEK GUIDE) test strip USE TO CHECK BLOOD SUGAR 3 TIMES DAILY 04/03/22   Charlott Rakes, MD  guaiFENesin 200 MG tablet Take 1 tablet (200 mg total) by mouth every 4 (four) hours as needed for cough or to loosen phlegm. Patient not taking: Reported on 06/20/2022 05/23/22   [name], NP  lidocaine (XYLOCAINE) 5 % ointment Apply 1 application. topically as needed for moderate pain. First choice is lidocaine patch, but if not covered/expensive, please fill ointment Patient not taking: Reported on 06/20/2022 11/08/21   Buford Dresser, MD  mupirocin ointment (BACTROBAN) 2 % Apply to affected area of feet once daily. Patient not taking: Reported on 06/20/2022 06/07/22   Marzetta Board, DPM      Allergies    Bee pollen, Bee venom, and Sulfa antibiotics    Review of Systems  Review of Systems  Respiratory:  Positive for shortness of breath.   Cardiovascular:  Positive for chest pain.    Physical Exam Updated Vital Signs BP 101/71 (BP Location: Right Wrist)   Pulse 90   Temp (!) 97.3 F (36.3 C) (Oral)   Resp 19   Ht '5\' 7"'$  (1.702 m)   Wt 130 kg   SpO2 100%   BMI 44.89 kg/m  Physical Exam Vitals and nursing note reviewed.  Constitutional:      General: She is not in acute distress.    Appearance: She is well-developed. She is not diaphoretic.  HENT:     Head: Normocephalic and atraumatic.     Right Ear: External ear normal.     Left Ear: External ear normal.     Nose: Nose normal.     Mouth/Throat:     Mouth: Mucous membranes are moist.  Eyes:     General: No scleral icterus.    Conjunctiva/sclera: Conjunctivae normal.  Cardiovascular:     Rate and Rhythm: Normal rate and regular rhythm.     Heart sounds: Normal heart sounds. No murmur heard.    No friction rub. No gallop.      Comments: Unsure if patient has edema in the lower extremity.  Does not appear to be pitting. Pulmonary:     Effort: Tachypnea present.     Breath sounds: Wheezing and rhonchi present.  Abdominal:     General: Bowel sounds are normal. There is no distension.     Palpations: Abdomen is soft. There is no mass.     Tenderness: There is no abdominal tenderness. There is no guarding.  Musculoskeletal:     Cervical back: Normal range of motion.  Skin:    General: Skin is warm and dry.  Neurological:     Mental Status: She is alert and oriented to person, place, and time.  Psychiatric:        Behavior: Behavior normal.     ED Results / Procedures / Treatments   Labs (all labs ordered are listed, but only abnormal results are displayed) Labs Reviewed  CBC WITH DIFFERENTIAL/PLATELET - Abnormal; Notable for the following components:      Result Value   RBC 3.40 (*)    Hemoglobin 10.3 (*)    HCT 32.1 (*)    All other components within normal limits  BRAIN NATRIURETIC PEPTIDE - Abnormal; Notable for the following components:   B Natriuretic Peptide 191.7 (*)    All other components within normal limits  COMPREHENSIVE METABOLIC PANEL - Abnormal; Notable for the following components:   Potassium 3.3 (*)    Chloride 94 (*)    CO2 34 (*)    Glucose, Bld 125 (*)    BUN 27 (*)    Creatinine, Ser 5.38 (*)    Total Protein 8.8 (*)    GFR, Estimated 8 (*)    All other components within normal limits  I-STAT VENOUS BLOOD GAS, ED - Abnormal; Notable for the following components:   pO2, Ven 23 (*)    Bicarbonate 34.9 (*)    TCO2 37 (*)    Acid-Base Excess 9.0 (*)    Potassium 3.4 (*)    HCT 34.0 (*)    Hemoglobin 11.6 (*)    All other components within normal limits  SARS CORONAVIRUS 2 BY RT PCR  MRSA NEXT GEN BY PCR, NASAL  RESPIRATORY PANEL BY PCR  VANCOMYCIN, RANDOM  CBC WITH DIFFERENTIAL/PLATELET  BASIC METABOLIC PANEL  CBG MONITORING, ED  TROPONIN I (HIGH SENSITIVITY)   TROPONIN I (HIGH SENSITIVITY)  TROPONIN I (HIGH SENSITIVITY)    EKG EKG Interpretation  Date/Time:  Tuesday June 20 2022 11:53:26 EDT Ventricular Rate:  86 PR Interval:  248 QRS Duration: 98 QT Interval:  462 QTC Calculation: 552 R Axis:   129 Text Interpretation:  Critical Test Result: Long QTc Sinus rhythm with Premature atrial complexes Incomplete right bundle branch block Possible Right ventricular hypertrophy Cannot rule out Anterior infarct , age undetermined Prolonged QT Abnormal ECG When compared with ECG of 18-May-2022 15:21, No significant change was found Confirmed by Orpah Greek [phone]) on 06/20/2022 12:00:18 PM  Radiology DG CHEST PORT 1 VIEW  Result Date: 06/21/2022 CLINICAL DATA:  Cough. EXAM: PORTABLE CHEST 1 VIEW COMPARISON:  June 20, 2022. FINDINGS: Stable cardiomegaly. Right internal jugular dialysis catheter is unchanged in position. Status post cardiac valve repair. No acute pulmonary disease is noted. Bony thorax is unremarkable. IMPRESSION: No acute cardiopulmonary abnormality seen. Electronically Signed   By: Marijo Conception M.D.   On: 06/21/2022 10:21   DG Chest 2 View  Result Date: 06/20/2022 CLINICAL DATA:  Chest pain cough. EXAM: CHEST - 2 VIEW COMPARISON:  June 08, 2022. FINDINGS: RIGHT-sided dialysis catheter terminates in the RIGHT atrium. Median sternotomy changes and evidence of prior LEFT atrial appendage clipping similar to previous imaging. Also with changes of valvular replacement. EKG leads project over the chest. Cardiomediastinal contours and hilar structures are stable accounting for low lung volumes. There is cardiomegaly. Central pulmonary vascular engorgement. No lobar consolidative process. No pneumothorax. On limited assessment no acute skeletal process. IMPRESSION: 1. Cardiomegaly with central pulmonary vascular engorgement. 2. No frank edema, lobar consolidation or sign of pleural effusion. Correlate with any clinical signs  of heart failure or volume overload. 3. Dialysis catheter terminates in the RIGHT atrium. Electronically Signed   By: Zetta Bills M.D.   On: 06/20/2022 13:51    Procedures .Critical Care  Performed by: Margarita Mail, PA-C Authorized by: Margarita Mail, PA-C   Critical care provider statement:    Critical care time (minutes):  75   Critical care time was exclusive of:  Separately billable procedures and treating other patients   Critical care was necessary to treat or prevent imminent or life-threatening deterioration of the following conditions:  Respiratory failure   Critical care was time spent personally by me on the following activities:  Development of treatment plan with patient or surrogate, discussions with consultants, evaluation of patient's response to treatment, examination of patient, ordering and review of laboratory studies, ordering and review of radiographic studies, ordering and performing treatments and interventions, pulse oximetry, re-evaluation of patient's condition, review of old charts, interpretation of cardiac output measurements and obtaining history from patient or surrogate     Medications Ordered in ED Medications  ceFEPIme (MAXIPIME) 1 g in sodium chloride 0.9 % 100 mL IVPB (0 g Intravenous Stopped 06/20/22 1746)  vancomycin variable dose per unstable renal function (pharmacist dosing) (has no administration in time range)  Chlorhexidine Gluconate Cloth 2 % PADS 6 each (has no administration in time range)  guaiFENesin (MUCINEX) 12 hr tablet 600 mg (has no administration in time range)  guaiFENesin (ROBITUSSIN) 100 MG/5ML liquid 5 mL (has no administration in time range)  benzonatate (TESSALON) capsule 100 mg (has no administration in time range)  ipratropium-albuterol (DUONEB) 0.5-2.5 (3) MG/3ML nebulizer solution 3 mL (has no administration in time range)  ipratropium-albuterol (DUONEB) 0.5-2.5 (3) MG/3ML nebulizer  solution 3 mL (3 mLs Nebulization  Given 06/20/22 1259)  albuterol (PROVENTIL) (2.5 MG/3ML) 0.083% nebulizer solution 5 mg (5 mg Nebulization Given 06/20/22 1509)  vancomycin (VANCOCIN) 2,500 mg in sodium chloride 0.9 % 500 mL IVPB (0 mg Intravenous Stopped 06/20/22 2030)  morphine (PF) 4 MG/ML injection 4 mg (4 mg Intravenous Given 06/20/22 2308)    ED Course/ Medical Decision Making/ A&P Clinical Course as of 06/21/22 1051  Tue Jun 20, 2022  1336 pO2, Ven(!!): 23 [AH]  1337 WBC: 5.7 [AH]    Clinical Course User Index [AH] Margarita Mail, PA-C                           Medical Decision Making This patient presents to the ED for concern of sob, this involves an extensive number of treatment options, and is a complaint that carries with it a high risk of complications and morbidity.  The emergent differential diagnosis for shortness of breath includes, but is not limited to, Pulmonary edema, bronchoconstriction, Pneumonia, Pulmonary embolism, Pneumotherax/ Hemothorax, Dysrythmia, ACS.        Co morbidities that complicate the patient evaluation       ESRD on HD, Biventricular Heart Failure, Recent dx RSV Pneumonia   Additional history obtained:  {Additional history obtained from husband at bedside {External records from outside source obtained and reviewed including Labs, imaging, notes   Lab Tests:  I Ordered, and personally interpreted labs.  The pertinent results include:   vbg - shows compensated mixed hypercarbic and hypoxic resp failure    Imaging Studies ordered:  I ordered imaging studies including cxr I independently visualized and interpreted imaging which showed cardiomegaly and pulmonary vascular congestion without evidence of pulmonary edema I agree with the radiologist interpretation   Cardiac Monitoring/ECG:       The patient was maintained on a cardiac monitor.  I personally viewed and interpreted the cardiac monitored which showed an underlying rhythm of: sinus  rhythm      Medicines ordered and prescription drug management:  I ordered medication including Medications albuterol (PROVENTIL) (2.5 MG/3ML) 0.083% nebulizer solution 5 mg (has no administration in time range) ipratropium-albuterol (DUONEB) 0.5-2.5 (3) MG/3ML nebulizer solution 3 mL (3 mLs Nebulization Given 06/20/22 1259) for sob Reevaluation of the patient after these medicines showed that the patient improved at rest but hypoxic with exertion I have reviewed the patients home medicines and have made adjustments as needed   Test Considered:       I considered a CT angiogram of the chest to rule out pulmonary embolus however patient has only been on dialysis for 4 months.   Critical Interventions:       Repeat and multiple breathing treatments   Consultations Obtained:   {I discussed the case with hospitalist (Dr. Thereasa Solo), they will admit   Problem List / ED Course:       (J12.1) Pneumonia due to respiratory syncytial virus (RSV)  (primary encounter diagnosis)  (J96.01) Acute respiratory failure with hypoxia ([place])     Reevaluation:  After the interventions noted above, I reevaluated the patient and found that they have :improved   Social Determinants of Health:       Good social support   Dispostion:  After consideration of the diagnostic results and the patients response to treatment, I feel that the patent would benefit from admit.    Amount and/or Complexity of Data Reviewed Independent Historian: spouse External Data Reviewed: labs, radiology  and notes. Labs: ordered. Decision-making details documented in ED Course. Radiology: ordered and independent interpretation performed. ECG/medicine tests: ordered and independent interpretation performed.  Risk Prescription drug management. Decision regarding hospitalization.           Final Clinical Impression(s) / ED Diagnoses Final diagnoses:  Pneumonia due to respiratory syncytial  virus (RSV)  Acute respiratory failure with hypoxia [facility])    Rx / DC Orders ED Discharge Orders     None         Margarita Mail, PA-C 06/21/22 1051    Orpah Greek, MD 06/25/22 [phone]

## 2022-06-20 NOTE — ED Notes (Signed)
RT note: Pt. seen in [place] with assessed obtained.

## 2022-06-20 NOTE — ED Triage Notes (Signed)
Pt  c/o chest pain and coughing. Pain started in chest last week and has gradually worsened. Pain/ 9/10. Productive cough, fever. Taking antibiotics for cough.

## 2022-06-20 NOTE — Plan of Care (Addendum)
   I was called to discuss this patient who is currently at [facility] where she presented with shortness of breath. She was diagnosed as ESRD in June 2023 and is on HD M/W/F via a right chest dialysis catheter.  She was recently treated at [facility] for RSV pneumonia, and after returning home experienced progressively worsening shortness of breath.  In emergency room she was noted to be saturating in the mid 80s with the slightest exertion complained of significant dyspnea.  She reported persistent fevers at home and worsening lethargy to the point that she could not care for herself.  She was COVID-negative at this presentation. CXR was w/o focal infiltrate.   She has been accepted to a medical tele bed at [place] as she will need HD during the course of her hospital stay (though not emergently). Nephrology will need to be consulted when she arrives at [facility].   Cherene Altes, MD [facility] Office  [phone]  06/20/2022, 3:59 PM

## 2022-06-20 NOTE — ED Notes (Signed)
Pt c/o CP 12 lead repeated, EDP made aware

## 2022-06-20 NOTE — Progress Notes (Signed)
Pharmacy Antibiotic Note  [name] is a 66 y.o. female admitted on 06/20/2022 with pneumonia.  Pharmacy has been consulted for vancomycin and cefepime dosing. Pt with recent admission to [facility] found to have RSV PNA. Pt taking doxycycline 100 BID x7 days (10/18-10/24). Presented with cough, SHOB, and WBC 5.7, afeb (although reported fevers PTA).    Plan: Vancomycin '2500mg'$  IV x1 Cefepime 1g IV q24h  F/u hemodialysis schedule Vancomycin levels as indicated Vanc random post load tomorrow AM  F/u renal function, CBC, cultures F/u MRSA PCR  Height: '5\' 7"'$  (170.2 cm) Weight: 130 kg (286 lb 9.6 oz) IBW/kg (Calculated) : 61.6  Temp (24hrs), Avg:98.4 F (36.9 C), Min:97.8 F (36.6 C), Max:98.9 F (37.2 C)  Recent Labs  Lab 06/20/22 1228  WBC 5.7  CREATININE 5.38*    Estimated Creatinine Clearance: 14.6 mL/min (A) (by C-G formula based on SCr of 5.38 mg/dL (H)).    Allergies  Allergen Reactions   Bee Pollen Anaphylaxis and Other (See Comments)    UNCONFIRMED, but IS allergic to bee VENOM   Bee Venom Anaphylaxis   Sulfa Antibiotics Itching    Antimicrobials this admission: 10/31 vanc x1 10/31 cefepime >>   Dose adjustments this admission:   Microbiology results: 10/31 MRSA PCR:    Thank you for allowing pharmacy to be a part of this patient's care.   Wilson Singer, PharmD Clinical Pharmacist 06/20/2022 3:48 PM

## 2022-06-21 ENCOUNTER — Inpatient Hospital Stay (HOSPITAL_COMMUNITY): Payer: Medicare Other

## 2022-06-21 ENCOUNTER — Telehealth: Payer: Self-pay | Admitting: Cardiology

## 2022-06-21 DIAGNOSIS — Z992 Dependence on renal dialysis: Secondary | ICD-10-CM | POA: Diagnosis not present

## 2022-06-21 DIAGNOSIS — Z1152 Encounter for screening for COVID-19: Secondary | ICD-10-CM | POA: Diagnosis not present

## 2022-06-21 DIAGNOSIS — I482 Chronic atrial fibrillation, unspecified: Secondary | ICD-10-CM | POA: Diagnosis present

## 2022-06-21 DIAGNOSIS — F32A Depression, unspecified: Secondary | ICD-10-CM | POA: Diagnosis present

## 2022-06-21 DIAGNOSIS — I82721 Chronic embolism and thrombosis of deep veins of right upper extremity: Secondary | ICD-10-CM

## 2022-06-21 DIAGNOSIS — E1122 Type 2 diabetes mellitus with diabetic chronic kidney disease: Secondary | ICD-10-CM | POA: Diagnosis present

## 2022-06-21 DIAGNOSIS — M7989 Other specified soft tissue disorders: Secondary | ICD-10-CM

## 2022-06-21 DIAGNOSIS — I480 Paroxysmal atrial fibrillation: Secondary | ICD-10-CM

## 2022-06-21 DIAGNOSIS — N2581 Secondary hyperparathyroidism of renal origin: Secondary | ICD-10-CM | POA: Diagnosis present

## 2022-06-21 DIAGNOSIS — I132 Hypertensive heart and chronic kidney disease with heart failure and with stage 5 chronic kidney disease, or end stage renal disease: Secondary | ICD-10-CM | POA: Diagnosis present

## 2022-06-21 DIAGNOSIS — I5042 Chronic combined systolic (congestive) and diastolic (congestive) heart failure: Secondary | ICD-10-CM | POA: Diagnosis present

## 2022-06-21 DIAGNOSIS — E669 Obesity, unspecified: Secondary | ICD-10-CM

## 2022-06-21 DIAGNOSIS — R5383 Other fatigue: Secondary | ICD-10-CM | POA: Diagnosis not present

## 2022-06-21 DIAGNOSIS — E1142 Type 2 diabetes mellitus with diabetic polyneuropathy: Secondary | ICD-10-CM | POA: Diagnosis present

## 2022-06-21 DIAGNOSIS — J189 Pneumonia, unspecified organism: Secondary | ICD-10-CM

## 2022-06-21 DIAGNOSIS — F419 Anxiety disorder, unspecified: Secondary | ICD-10-CM | POA: Diagnosis present

## 2022-06-21 DIAGNOSIS — I5082 Biventricular heart failure: Secondary | ICD-10-CM | POA: Diagnosis present

## 2022-06-21 DIAGNOSIS — R52 Pain, unspecified: Secondary | ICD-10-CM

## 2022-06-21 DIAGNOSIS — J121 Respiratory syncytial virus pneumonia: Secondary | ICD-10-CM | POA: Diagnosis present

## 2022-06-21 DIAGNOSIS — Z9889 Other specified postprocedural states: Secondary | ICD-10-CM

## 2022-06-21 DIAGNOSIS — D631 Anemia in chronic kidney disease: Secondary | ICD-10-CM | POA: Diagnosis present

## 2022-06-21 DIAGNOSIS — J449 Chronic obstructive pulmonary disease, unspecified: Secondary | ICD-10-CM

## 2022-06-21 DIAGNOSIS — R042 Hemoptysis: Secondary | ICD-10-CM | POA: Diagnosis present

## 2022-06-21 DIAGNOSIS — Z6841 Body Mass Index (BMI) 40.0 and over, adult: Secondary | ICD-10-CM | POA: Diagnosis not present

## 2022-06-21 DIAGNOSIS — G4733 Obstructive sleep apnea (adult) (pediatric): Secondary | ICD-10-CM

## 2022-06-21 DIAGNOSIS — E1169 Type 2 diabetes mellitus with other specified complication: Secondary | ICD-10-CM

## 2022-06-21 DIAGNOSIS — Z794 Long term (current) use of insulin: Secondary | ICD-10-CM | POA: Diagnosis not present

## 2022-06-21 DIAGNOSIS — I10 Essential (primary) hypertension: Secondary | ICD-10-CM

## 2022-06-21 DIAGNOSIS — M6281 Muscle weakness (generalized): Secondary | ICD-10-CM | POA: Diagnosis not present

## 2022-06-21 DIAGNOSIS — J440 Chronic obstructive pulmonary disease with acute lower respiratory infection: Secondary | ICD-10-CM | POA: Diagnosis present

## 2022-06-21 DIAGNOSIS — J9601 Acute respiratory failure with hypoxia: Secondary | ICD-10-CM

## 2022-06-21 DIAGNOSIS — K5909 Other constipation: Secondary | ICD-10-CM | POA: Diagnosis present

## 2022-06-21 DIAGNOSIS — N186 End stage renal disease: Secondary | ICD-10-CM

## 2022-06-21 DIAGNOSIS — Y95 Nosocomial condition: Secondary | ICD-10-CM | POA: Diagnosis present

## 2022-06-21 DIAGNOSIS — Z79899 Other long term (current) drug therapy: Secondary | ICD-10-CM | POA: Diagnosis not present

## 2022-06-21 DIAGNOSIS — E785 Hyperlipidemia, unspecified: Secondary | ICD-10-CM | POA: Diagnosis present

## 2022-06-21 HISTORY — DX: Pneumonia, unspecified organism: J18.9

## 2022-06-21 LAB — CBC WITH DIFFERENTIAL/PLATELET
Abs Immature Granulocytes: 0.02 10*3/uL (ref 0.00–0.07)
Basophils Absolute: 0 10*3/uL (ref 0.0–0.1)
Basophils Relative: 1 %
Eosinophils Absolute: 0.2 10*3/uL (ref 0.0–0.5)
Eosinophils Relative: 4 %
HCT: 30.3 % — ABNORMAL LOW (ref 36.0–46.0)
Hemoglobin: 9.7 g/dL — ABNORMAL LOW (ref 12.0–15.0)
Immature Granulocytes: 0 %
Lymphocytes Relative: 22 %
Lymphs Abs: 1.4 10*3/uL (ref 0.7–4.0)
MCH: 30.2 pg (ref 26.0–34.0)
MCHC: 32 g/dL (ref 30.0–36.0)
MCV: 94.4 fL (ref 80.0–100.0)
Monocytes Absolute: 1.2 10*3/uL — ABNORMAL HIGH (ref 0.1–1.0)
Monocytes Relative: 19 %
Neutro Abs: 3.3 10*3/uL (ref 1.7–7.7)
Neutrophils Relative %: 54 %
Platelets: 169 10*3/uL (ref 150–400)
RBC: 3.21 MIL/uL — ABNORMAL LOW (ref 3.87–5.11)
RDW: 14.8 % (ref 11.5–15.5)
WBC: 6.1 10*3/uL (ref 4.0–10.5)
nRBC: 0 % (ref 0.0–0.2)

## 2022-06-21 LAB — BASIC METABOLIC PANEL
Anion gap: 14 (ref 5–15)
BUN: 37 mg/dL — ABNORMAL HIGH (ref 8–23)
CO2: 29 mmol/L (ref 22–32)
Calcium: 9.5 mg/dL (ref 8.9–10.3)
Chloride: 96 mmol/L — ABNORMAL LOW (ref 98–111)
Creatinine, Ser: 7.19 mg/dL — ABNORMAL HIGH (ref 0.44–1.00)
GFR, Estimated: 6 mL/min — ABNORMAL LOW (ref >60)
Glucose, Bld: 91 mg/dL (ref 70–99)
Potassium: 3.9 mmol/L (ref 3.5–5.1)
Sodium: 139 mmol/L (ref 135–145)

## 2022-06-21 LAB — CBG MONITORING, ED: Glucose-Capillary: 92 mg/dL (ref 70–99)

## 2022-06-21 LAB — VANCOMYCIN, RANDOM: Vancomycin Rm: 35 ug/mL

## 2022-06-21 LAB — HEPATITIS B SURFACE ANTIGEN: Hepatitis B Surface Ag: NONREACTIVE

## 2022-06-21 MED ORDER — BUSPIRONE HCL 5 MG PO TABS
5.0000 mg | ORAL_TABLET | Freq: Two times a day (BID) | ORAL | Status: DC
  Administered 2022-06-21 – 2022-06-25 (×9): 5 mg via ORAL
  Filled 2022-06-21 (×9): qty 1

## 2022-06-21 MED ORDER — POLYETHYLENE GLYCOL 3350 17 GM/SCOOP PO POWD: 17.0000 g | Freq: Every day | ORAL | Status: DC | PRN

## 2022-06-21 MED ORDER — VANCOMYCIN HCL IN DEXTROSE 1-5 GM/200ML-% IV SOLN
1000.0000 mg | INTRAVENOUS | Status: DC
  Administered 2022-06-22 – 2022-06-24 (×2): 1000 mg via INTRAVENOUS
  Filled 2022-06-21 (×3): qty 200

## 2022-06-21 MED ORDER — ADULT MULTIVITAMIN W/MINERALS CH
1.0000 | ORAL_TABLET | Freq: Every day | ORAL | Status: DC
  Administered 2022-06-21 – 2022-06-25 (×5): 1 via ORAL
  Filled 2022-06-21 (×5): qty 1

## 2022-06-21 MED ORDER — GUAIFENESIN 100 MG/5ML PO LIQD: 5.0000 mL | ORAL | Status: DC | PRN

## 2022-06-21 MED ORDER — APIXABAN 5 MG PO TABS
5.0000 mg | ORAL_TABLET | Freq: Two times a day (BID) | ORAL | Status: DC
  Administered 2022-06-21 – 2022-06-25 (×9): 5 mg via ORAL
  Filled 2022-06-21 (×9): qty 1

## 2022-06-21 MED ORDER — METOPROLOL SUCCINATE ER 25 MG PO TB24
25.0000 mg | ORAL_TABLET | Freq: Every day | ORAL | Status: DC
  Administered 2022-06-21 – 2022-06-25 (×4): 25 mg via ORAL
  Filled 2022-06-21 (×5): qty 1

## 2022-06-21 MED ORDER — CHLORHEXIDINE GLUCONATE CLOTH 2 % EX PADS
6.0000 | MEDICATED_PAD | Freq: Every day | CUTANEOUS | Status: DC
  Administered 2022-06-21 – 2022-06-23 (×3): 6 via TOPICAL

## 2022-06-21 MED ORDER — NITROGLYCERIN 0.4 MG SL SUBL: 0.4000 mg | SUBLINGUAL_TABLET | SUBLINGUAL | Status: DC | PRN

## 2022-06-21 MED ORDER — IPRATROPIUM-ALBUTEROL 0.5-2.5 (3) MG/3ML IN SOLN: 3.0000 mL | Freq: Four times a day (QID) | RESPIRATORY_TRACT | Status: DC | PRN

## 2022-06-21 MED ORDER — ISOSORBIDE MONONITRATE ER 30 MG PO TB24
15.0000 mg | ORAL_TABLET | Freq: Every day | ORAL | Status: DC
  Administered 2022-06-21 – 2022-06-25 (×5): 15 mg via ORAL
  Filled 2022-06-21 (×5): qty 1

## 2022-06-21 MED ORDER — ONDANSETRON HCL 4 MG PO TABS: 4.0000 mg | ORAL_TABLET | Freq: Four times a day (QID) | ORAL | Status: DC | PRN

## 2022-06-21 MED ORDER — PREGABALIN 100 MG PO CAPS: 100.0000 mg | ORAL_CAPSULE | Freq: Three times a day (TID) | ORAL | Status: DC | PRN

## 2022-06-21 MED ORDER — DOXERCALCIFEROL 4 MCG/2ML IV SOLN
1.0000 ug | INTRAVENOUS | Status: DC
  Administered 2022-06-22 – 2022-06-24 (×2): 1 ug via INTRAVENOUS
  Filled 2022-06-21 (×3): qty 2

## 2022-06-21 MED ORDER — BENZONATATE 100 MG PO CAPS: 100.0000 mg | ORAL_CAPSULE | Freq: Three times a day (TID) | ORAL | Status: DC | PRN

## 2022-06-21 MED ORDER — GUAIFENESIN ER 600 MG PO TB12
600.0000 mg | ORAL_TABLET | Freq: Two times a day (BID) | ORAL | Status: DC
  Administered 2022-06-21 – 2022-06-25 (×8): 600 mg via ORAL
  Filled 2022-06-21 (×9): qty 1

## 2022-06-21 MED ORDER — ACETAMINOPHEN 650 MG RE SUPP: 650.0000 mg | Freq: Four times a day (QID) | RECTAL | Status: DC | PRN

## 2022-06-21 MED ORDER — ACETAMINOPHEN 325 MG PO TABS
650.0000 mg | ORAL_TABLET | Freq: Four times a day (QID) | ORAL | Status: DC | PRN
  Administered 2022-06-22: 650 mg via ORAL
  Filled 2022-06-21: qty 2

## 2022-06-21 MED ORDER — SENNOSIDES-DOCUSATE SODIUM 8.6-50 MG PO TABS
1.0000 | ORAL_TABLET | Freq: Two times a day (BID) | ORAL | Status: DC
  Administered 2022-06-21 – 2022-06-22 (×3): 1 via ORAL
  Filled 2022-06-21 (×9): qty 1

## 2022-06-21 MED ORDER — AMIODARONE HCL 200 MG PO TABS
200.0000 mg | ORAL_TABLET | Freq: Two times a day (BID) | ORAL | Status: DC
  Administered 2022-06-21 – 2022-06-25 (×9): 200 mg via ORAL
  Filled 2022-06-21 (×9): qty 1

## 2022-06-21 MED ORDER — COLCHICINE 0.6 MG PO TABS
0.6000 mg | ORAL_TABLET | Freq: Every day | ORAL | Status: DC
  Administered 2022-06-21 – 2022-06-25 (×5): 0.6 mg via ORAL
  Filled 2022-06-21 (×6): qty 1

## 2022-06-21 MED ORDER — ONDANSETRON HCL 4 MG/2ML IJ SOLN
4.0000 mg | Freq: Four times a day (QID) | INTRAMUSCULAR | Status: DC | PRN
  Administered 2022-06-24: 4 mg via INTRAVENOUS
  Filled 2022-06-21: qty 2

## 2022-06-21 MED ORDER — DIPHENHYDRAMINE HCL 25 MG PO CAPS
25.0000 mg | ORAL_CAPSULE | Freq: Four times a day (QID) | ORAL | Status: DC | PRN
  Administered 2022-06-21 – 2022-06-23 (×4): 25 mg via ORAL
  Filled 2022-06-21 (×4): qty 1

## 2022-06-21 MED ORDER — DULOXETINE HCL 60 MG PO CPEP
60.0000 mg | ORAL_CAPSULE | Freq: Every day | ORAL | Status: DC
  Administered 2022-06-21 – 2022-06-25 (×5): 60 mg via ORAL
  Filled 2022-06-21 (×5): qty 1

## 2022-06-21 MED ORDER — VANCOMYCIN VARIABLE DOSE PER UNSTABLE RENAL FUNCTION (PHARMACIST DOSING)
Status: DC
  Filled 2022-06-21: qty 1

## 2022-06-21 NOTE — Consult Note (Addendum)
[facility] Renal Consultation Note    Indication for Consultation:  Management of ESRD/hemodialysis; anemia, hypertension/volume and secondary hyperparathyroidism PCP: Dr. Juluis Mire   HPI: [name] is a 66 y.o. female with ESRD on hemodialysis T,Th,S at [facility]. PMH: DM, HTN, combined systolic and diastolic HF, TVR, MVR in 76/1607, Afib S/P maze, morbid obesity, COPD, DVT, SHPT, anemia of ESRD. Recent admission to [facility] for  RSV bronchitis superimposed bacterial infection 10/19-10/24/2023. She presented to [facility] ED 06/20/2022 with C/O cough, chest congestion, fever, chest pain. She is negative for COVID. Last HD 06/20/2022 ran full treatment, got to her EDW but was coughing so hard, she infiltrated AVF, now has large hematoma on AVF. She has been admitted with acute hypoxic respiratory failure, possible HCAP. CXR unremarkable. Labs unremarkable for ESRD patient, no acute HD needs today.   Seen in room. She is still coughing, says she feels awful. O2 sats 98% on RA. No LE edema. L AVF tender to touch. +T/B. She will have HD 06/22/2022 on schedule via [place].   Past Medical History:  Diagnosis Date   Acute on chronic systolic CHF (congestive heart failure) (HCC) 12/21/2020   Allergic rhinitis    Arthritis    Asthma    Chronic diastolic CHF (congestive heart failure) (HCC)    COPD (chronic obstructive pulmonary disease) (HCC)    Depression    DM (diabetes mellitus) ([place])    DVT (deep vein thrombosis) in pregnancy    GERD (gastroesophageal reflux disease)    HTN (hypertension)    Hyperlipidemia    Obesity    Pneumonia 04/2018   RIGHT LOBE   Sleep apnea    compliant with CPAP   Past Surgical History:  Procedure Laterality Date   ABDOMINAL HYSTERECTOMY     AV FISTULA PLACEMENT Left 03/17/2022   Procedure: LEFT ARM ARTERIOVENOUS (AV) FISTULA CREATION;  Surgeon: Angelia Mould, MD;  Location: [place];  Service: Vascular;  Laterality: Left;    BUBBLE STUDY  11/26/2020   Procedure: BUBBLE STUDY;  Surgeon: Buford Dresser, MD;  Location: [place];  Service: Cardiovascular;;   CARDIOVERSION N/A 05/06/2020   Procedure: CARDIOVERSION;  Surgeon: Buford Dresser, MD;  Location: [place];  Service: Cardiovascular;  Laterality: N/A;   CARDIOVERSION N/A 11/04/2020   Procedure: CARDIOVERSION;  Surgeon: Lelon Perla, MD;  Location: [place];  Service: Cardiovascular;  Laterality: N/A;   CARDIOVERSION N/A 02/01/2021   Procedure: CARDIOVERSION;  Surgeon: Jolaine Artist, MD;  Location: [place];  Service: Cardiovascular;  Laterality: N/A;   CARDIOVERSION N/A 02/09/2021   Procedure: CARDIOVERSION;  Surgeon: Jolaine Artist, MD;  Location: [place];  Service: Cardiovascular;  Laterality: N/A;   CARDIOVERSION N/A 04/19/2021   Procedure: CARDIOVERSION;  Surgeon: Fay Records, MD;  Location: [place];  Service: Cardiovascular;  Laterality: N/A;   CARDIOVERSION N/A 11/25/2021   Procedure: CARDIOVERSION;  Surgeon: Jolaine Artist, MD;  Location: [place];  Service: Cardiovascular;  Laterality: N/A;   CARDIOVERSION N/A 01/31/2022   Procedure: CARDIOVERSION;  Surgeon: Jolaine Artist, MD;  Location: [place];  Service: Cardiovascular;  Laterality: N/A;   CATARACT EXTRACTION, BILATERAL     CHOLECYSTECTOMY     COLONOSCOPY WITH PROPOFOL N/A 03/05/2015   Procedure: COLONOSCOPY WITH PROPOFOL;  Surgeon: Carol Ada, MD;  Location: [facility];  Service: Endoscopy;  Laterality: N/A;   DIAGNOSTIC LAPAROSCOPY     FISTULA SUPERFICIALIZATION Left 03/17/2022   Procedure: FISTULA SUPERFICIALIZATION -LEFT;  Surgeon: Angelia Mould, MD;  Location: [facility];  Service: Vascular;  Laterality: Left;   ingrown hallux Left    IR FLUORO GUIDE CV LINE RIGHT  03/14/2022   IR US GUIDE VASC ACCESS RIGHT  03/14/2022   KNEE SURGERY     LEFT HEART CATH AND CORONARY ANGIOGRAPHY N/A 12/31/2017   Procedure: LEFT HEART CATH  AND CORONARY ANGIOGRAPHY;  Surgeon: Charolette Forward, MD;  Location: [place] CV [facility];  Service: Cardiovascular;  Laterality: N/A;   MAZE N/A 12/29/2020   Procedure: MAZE;  Surgeon: [name], MD;  Location: [place];  Service: Open Heart Surgery;  Laterality: N/A;   MITRAL VALVE REPAIR N/A 12/29/2020   Procedure: MITRAL VALVE REPAIR USING CARBOMEDICS ANNULOFLEX RING SIZE 30;  Surgeon: [name], MD;  Location: [place];  Service: Open Heart Surgery;  Laterality: N/A;   RIGHT HEART CATH N/A 12/22/2020   Procedure: RIGHT HEART CATH;  Surgeon: Larey Dresser, MD;  Location: [place] [facility];  Service: Cardiovascular;  Laterality: N/A;   RIGHT HEART CATH N/A 01/31/2021   Procedure: RIGHT HEART CATH;  Surgeon: Jolaine Artist, MD;  Location: [place] [facility];  Service: Cardiovascular;  Laterality: N/A;   RIGHT HEART CATH N/A 07/29/2021   Procedure: RIGHT HEART CATH;  Surgeon: Jolaine Artist, MD;  Location: [place] [facility];  Service: Cardiovascular;  Laterality: N/A;   RIGHT HEART CATH N/A 11/24/2021   Procedure: RIGHT HEART CATH;  Surgeon: Jolaine Artist, MD;  Location: [place] [facility];  Service: Cardiovascular;  Laterality: N/A;   RIGHT HEART CATH N/A 02/14/2022   Procedure: RIGHT HEART CATH;  Surgeon: Jolaine Artist, MD;  Location: [place] CV [facility];  Service: Cardiovascular;  Laterality: N/A;   RIGHT HEART CATH N/A 02/22/2022   Procedure: RIGHT HEART CATH;  Surgeon: Larey Dresser, MD;  Location: [place] [facility];  Service: Cardiovascular;  Laterality: N/A;   TEE WITHOUT CARDIOVERSION N/A 11/26/2020   Procedure: TRANSESOPHAGEAL ECHOCARDIOGRAM (TEE);  Surgeon: Buford Dresser, MD;  Location: [facility];  Service: Cardiovascular;  Laterality: N/A;   TEE WITHOUT CARDIOVERSION N/A 12/29/2020   Procedure: TRANSESOPHAGEAL ECHOCARDIOGRAM (TEE);  Surgeon: [name], MD;  Location: [place];  Service: Open Heart Surgery;  Laterality: N/A;   TEE  WITHOUT CARDIOVERSION N/A 01/20/2022   Procedure: TRANSESOPHAGEAL ECHOCARDIOGRAM (TEE);  Surgeon: Jolaine Artist, MD;  Location: [place];  Service: Cardiovascular;  Laterality: N/A;   TRICUSPID VALVE REPLACEMENT N/A 12/29/2020   Procedure: 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 RING SIZE 34;  Surgeon: [name], MD;  Location: [place];  Service: Open Heart Surgery;  Laterality: N/A;   Family History  Problem Relation Age of Onset   Breast cancer Mother    Cancer Mother    Hypertension Mother    Cancer Father    Hypertension Sister    Hypertension Brother    CAD Other    Social History:  reports that she has never smoked. She has never been exposed to tobacco smoke. She has never used smokeless tobacco. She reports that she does not drink alcohol and does not use drugs. Allergies  Allergen Reactions   Bee Pollen Anaphylaxis and Other (See Comments)    UNCONFIRMED, but IS allergic to bee VENOM   Bee Venom Anaphylaxis   Sulfa Antibiotics Itching   Prior to Admission medications   Medication Sig Start Date End Date Taking? Authorizing Provider  acetaminophen (TYLENOL) 325 MG tablet Take 2 tablets (650 mg  total) by mouth every 6 (six) hours as needed for mild pain or moderate pain. 12/07/21  Yes Arrien, Jimmy Picket, MD  albuterol (VENTOLIN HFA) 108 (90 Base) MCG/ACT inhaler INHALE 1-2 PUFFS BY MOUTH EVERY 6 HOURS AS NEEDED FOR WHEEZE OR SHORTNESS OF BREATH 04/12/22  Yes [name], NP  amiodarone (PACERONE) 200 MG tablet TAKE 1 TABLET BY MOUTH TWICE A DAY 04/25/22  Yes Bensimhon, Shaune Pascal, MD  apixaban (ELIQUIS) 5 MG TABS tablet TAKE ONE TABLET BY MOUTH TWICE DAILY at [place] Patient taking differently: Take 5 mg by mouth in the morning and at bedtime. 02/24/22  Yes Shelly Coss, MD  benzonatate (TESSALON PERLES) 100 MG capsule Take 1 capsule (100 mg total) by mouth 3 (three) times daily as needed for cough. 05/23/22  Yes [name], NP   busPIRone (BUSPAR) 5 MG tablet Take 1 tablet (5 mg total) by mouth 2 (two) times daily. 05/14/22  Yes [name], NP  colchicine 0.6 MG tablet TAKE 0.5 TABLETS BY MOUTH DAILY. 04/03/22  Yes Charlott Rakes, MD  diphenhydrAMINE (BENADRYL) 25 mg capsule Take 25 mg by mouth every 6 (six) hours as needed for allergies or itching.   Yes [provider]  DULoxetine (CYMBALTA) 60 MG capsule Take 1 capsule (60 mg total) by mouth daily. 05/08/22  Yes [name], NP  fluticasone (FLONASE) 50 MCG/ACT nasal spray Place 2 sprays into both nostrils daily as needed for allergies or rhinitis. 04/25/22  Yes [name], NP  insulin aspart (NOVOLOG) 100 UNIT/ML injection Inject 2-10 Units into the skin 3 (three) times daily before meals. Per sliding scale Patient taking differently: Inject 2-10 Units into the skin See admin instructions. Inject 2-10 units into the skin three times a day before meals, per sliding scale 09/02/21  Yes Aline August, MD  isosorbide mononitrate (IMDUR) 30 MG 24 hr tablet Take 0.5 tablets (15 mg total) by mouth daily. 03/18/22 06/20/22 Yes Arrien, Jimmy Picket, MD  KLOR-CON M20 20 MEQ tablet Take 20 mEq by mouth 2 (two) times daily. 04/18/22  Yes [provider]  lidocaine-prilocaine (EMLA) cream SMARTSIG:sparingly Topical 3 Times a Week 06/01/22  Yes [provider]  LINZESS 72 MCG capsule TAKE 1 CAPSULE (72 MCG TOTAL) BY MOUTH DAILY BEFORE BREAKFAST. IF BM NOT ACHIEVE WITH 1 CAPSULE SHE MAY TAKE 2 DAILY. STOP IF DIARRHEA OCCURS 04/25/22  Yes [name], NP  loratadine (CLARITIN) 10 MG tablet Take 1 tablet (10 mg total) by mouth daily. 12/29/21  Yes [name], NP  metolazone (ZAROXOLYN) 2.5 MG tablet Take 2.5 mg by mouth 2 (two) times a week. 04/20/22  Yes [provider]  metoprolol succinate (TOPROL-XL) 25 MG 24 hr tablet Take 1 tablet (25 mg total) by mouth daily. 03/18/22 06/20/22 Yes Arrien, Jimmy Picket, MD   Multiple Vitamins-Minerals (MULTIVITAMIN WOMEN PO) Take 1 tablet by mouth daily.   Yes [provider]  multivitamin (RENA-VIT) TABS tablet Take 1 tablet by mouth daily. 04/27/22  Yes [provider]  nitroGLYCERIN (NITROSTAT) 0.4 MG SL tablet Place 1 tablet (0.4 mg total) under the tongue every 5 (five) minutes x 3 doses as needed for chest pain. 01/08/14  Yes Charolette Forward, MD  pantoprazole (PROTONIX) 40 MG tablet TAKE ONE TABLET BY MOUTH DAILY AT 9AM 04/17/22  Yes [name], NP  polyethylene glycol powder (GLYCOLAX/MIRALAX) 17 GM/SCOOP powder Take 17 g by mouth daily. Patient taking differently: Take 17 g by mouth  daily as needed for mild constipation. 02/25/22  Yes Shelly Coss, MD  pregabalin (LYRICA) 100 MG capsule TAKE 1 CAPSULE BY MOUTH THREE TIMES A DAY Patient taking differently: Take 100 mg by mouth 3 (three) times daily as needed (for pain). 02/26/22  Yes [name], NP  RESTASIS 0.05 % ophthalmic emulsion Place 1 drop into both eyes in the morning and at bedtime. 11/19/17  Yes [provider]  rosuvastatin (CRESTOR) 10 MG tablet TAKE ONE TABLET BY MOUTH DAILY AT 5PM 05/18/22  Yes Joette Catching, PA-C  senna-docusate (SENOKOT-S) 8.6-50 MG tablet Take 1 tablet by mouth 2 (two) times daily. 02/24/22  Yes Shelly Coss, MD  torsemide (DEMADEX) 100 MG tablet Take 1 tablet by mouth. Monday Wednesday and Friday 04/02/22  Yes [provider]  TRULICITY 1.5 PJ/0.3PR SOPN INJECT 1.5 MG INTO THE SKIN ONCE A WEEK. Patient taking differently: Inject 1.5 mg into the skin every Wednesday. 02/26/22  Yes [name], NP  EPINEPHrine 0.3 mg/0.3 mL IJ SOAJ injection Inject 0.3 mg into the muscle as needed for anaphylaxis. 06/25/20   [name], NP  glucose blood (ACCU-CHEK GUIDE) test strip USE TO CHECK BLOOD SUGAR 3 TIMES DAILY 04/03/22   Charlott Rakes, MD  guaiFENesin 200 MG tablet Take 1 tablet (200 mg total) by mouth every 4 (four)  hours as needed for cough or to loosen phlegm. Patient not taking: Reported on 06/20/2022 05/23/22   [name], NP  lidocaine (XYLOCAINE) 5 % ointment Apply 1 application. topically as needed for moderate pain. First choice is lidocaine patch, but if not covered/expensive, please fill ointment Patient not taking: Reported on 06/20/2022 11/08/21   Buford Dresser, MD  mupirocin ointment (BACTROBAN) 2 % Apply to affected area of feet once daily. Patient not taking: Reported on 06/20/2022 06/07/22   Marzetta Board, DPM   Current Facility-Administered Medications  Medication Dose Route Frequency Provider Last Rate Last Admin   acetaminophen (TYLENOL) tablet 650 mg  650 mg Oral Q6H PRN Charlann Lange, MD       Or   acetaminophen (TYLENOL) suppository 650 mg  650 mg Rectal Q6H PRN Nicoletta Dress, Na, MD       amiodarone (PACERONE) tablet 200 mg  200 mg Oral BID Nicoletta Dress, Na, MD   200 mg at 06/21/22 1352   apixaban (ELIQUIS) tablet 5 mg  5 mg Oral BID Nicoletta Dress, Na, MD   5 mg at 06/21/22 1352   benzonatate (TESSALON) capsule 100 mg  100 mg Oral TID PRN Charlann Lange, MD       busPIRone (BUSPAR) tablet 5 mg  5 mg Oral BID Nicoletta Dress, Na, MD   5 mg at 06/21/22 1352   ceFEPIme (MAXIPIME) 1 g in sodium chloride 0.9 % 100 mL IVPB  1 g Intravenous Q24H Charlann Lange, MD   Stopped at 06/20/22 1746   Chlorhexidine Gluconate Cloth 2 % PADS 6 each  6 each Topical Daily Nicoletta Dress, Na, MD   6 each at 06/21/22 1054   colchicine tablet 0.6 mg  0.6 mg Oral Daily Li, Na, MD   0.6 mg at 06/21/22 1418   diphenhydrAMINE (BENADRYL) capsule 25 mg  25 mg Oral Q6H PRN Nicoletta Dress, Na, MD       DULoxetine (CYMBALTA) DR capsule 60 mg  60 mg Oral Daily Li, Na, MD   60 mg at 06/21/22 1354   guaiFENesin (MUCINEX) 12 hr tablet 600 mg  600 mg Oral BID Charlann Lange, MD  600 mg at 06/21/22 1352   guaiFENesin (ROBITUSSIN) 100 MG/5ML liquid 5 mL  5 mL Oral Q4H PRN Nicoletta Dress, Na, MD       ipratropium-albuterol (DUONEB) 0.5-2.5 (3) MG/3ML nebulizer solution 3 mL  3 mL Nebulization Q6H PRN Nicoletta Dress, Na,  MD       isosorbide mononitrate (IMDUR) 24 hr tablet 15 mg  15 mg Oral Daily Li, Na, MD   15 mg at 06/21/22 1351   metoprolol succinate (TOPROL-XL) 24 hr tablet 25 mg  25 mg Oral Daily Li, Na, MD   25 mg at 06/21/22 1419   multivitamin with minerals tablet 1 tablet  1 tablet Oral Daily Nicoletta Dress, Na, MD   1 tablet at 06/21/22 1354   nitroGLYCERIN (NITROSTAT) SL tablet 0.4 mg  0.4 mg Sublingual Q5 Min x 3 PRN Nicoletta Dress, Na, MD       ondansetron (ZOFRAN) tablet 4 mg  4 mg Oral Q6H PRN Nicoletta Dress, Na, MD       Or   ondansetron (ZOFRAN) injection 4 mg  4 mg Intravenous Q6H PRN Nicoletta Dress, Na, MD       polyethylene glycol powder (GLYCOLAX/MIRALAX) container 17 g  17 g Oral Daily PRN Nicoletta Dress, Na, MD       pregabalin (LYRICA) capsule 100 mg  100 mg Oral TID PRN Charlann Lange, MD       senna-docusate (Senokot-S) tablet 1 tablet  1 tablet Oral BID Nicoletta Dress, Na, MD   1 tablet at 06/21/22 1353   vancomycin variable dose per unstable renal function (pharmacist dosing)   Does not apply See admin instructions Charlann Lange, MD       Labs: Basic Metabolic Panel: Recent Labs  Lab 06/20/22 1228 06/20/22 1254 06/21/22 1100  NA 136 138 139  K 3.3* 3.4* 3.9  CL 94*  --  96*  CO2 34*  --  29  GLUCOSE 125*  --  91  BUN 27*  --  37*  CREATININE 5.38*  --  7.19*  CALCIUM 9.4  --  9.5   Liver Function Tests: Recent Labs  Lab 06/20/22 1228  AST 21  ALT 20  ALKPHOS 105  BILITOT 0.6  PROT 8.8*  ALBUMIN 4.0   No results for input(s): "LIPASE", "AMYLASE" in the last 168 hours. No results for input(s): "AMMONIA" in the last 168 hours. CBC: Recent Labs  Lab 06/20/22 1228 06/20/22 1254 06/21/22 1100  WBC 5.7  --  6.1  NEUTROABS 3.2  --  3.3  HGB 10.3* 11.6* 9.7*  HCT 32.1* 34.0* 30.3*  MCV 94.4  --  94.4  PLT 182  --  169   Cardiac Enzymes: No results for input(s): "CKTOTAL", "CKMB", "CKMBINDEX", "TROPONINI" in the last 168 hours. CBG: Recent Labs  Lab 06/21/22 0819  GLUCAP 92   Iron Studies: No results for input(s): "IRON", "TIBC",  "TRANSFERRIN", "FERRITIN" in the last 72 hours. Studies/Results: VAS [place] UPPER EXTREMITY VENOUS DUPLEX  Result Date: 06/21/2022 UPPER VENOUS STUDY  Patient Name:  RAECHEL MARCOS  Date of Exam:   06/21/2022 Medical Rec #: [id number]       Accession #:    [id number] Date of Birth: 1955/12/21       Patient Gender: F Patient Age:   34 years Exam Location:  [facility] Procedure:      VAS [place] UPPER EXTREMITY VENOUS DUPLEX Referring Phys: NA LI --------------------------------------------------------------------------------  Indications: Pain and swelling of left upper arm, site of brachio-cephalic AVF Other Indications: History of DVT  lower extremity. Risk Factors: Surgery 03-17-2022 Left brachio-cephalic AVF creation. Anticoagulation: Eliquis. Comparison Study: 05-04-2022 Prior duplex dialysis access showed patent AVF Performing Technologist: Darlin Coco RDMS, RVT  Examination Guidelines: A complete evaluation includes B-mode imaging, spectral Doppler, color Doppler, and power Doppler as needed of all accessible portions of each vessel. Bilateral testing is considered an integral part of a complete examination. Limited examinations for reoccurring indications may be performed as noted.  Right Findings: +----------+------------+---------+-----------+----------+-------+ RIGHT     CompressiblePhasicitySpontaneousPropertiesSummary +----------+------------+---------+-----------+----------+-------+ Subclavian               Yes       Yes                      +----------+------------+---------+-----------+----------+-------+  Left Findings: +----------+------------+---------+-----------+----------+----------+ LEFT      CompressiblePhasicitySpontaneousProperties Summary   +----------+------------+---------+-----------+----------+----------+ IJV                      Yes       Yes                         +----------+------------+---------+-----------+----------+----------+ Subclavian                Yes       Yes                         +----------+------------+---------+-----------+----------+----------+ Axillary      Full       Yes       Yes                         +----------+------------+---------+-----------+----------+----------+ Brachial      Full                                             +----------+------------+---------+-----------+----------+----------+ Radial        Full                                             +----------+------------+---------+-----------+----------+----------+ Ulnar         Full                                             +----------+------------+---------+-----------+----------+----------+ Cephalic                                            Patent AVF +----------+------------+---------+-----------+----------+----------+ Basilic       Full                                             +----------+------------+---------+-----------+----------+----------+  Summary:  Right: No evidence of thrombosis in the subclavian.  Left: No evidence of deep vein thrombosis in the upper extremity. No evidence of superficial vein thrombosis in the upper extremity.  Incidental: Avascular, heterogenous focal collection noted at the mid upper  arm, deep to the cephalic AVF. Measures 2.2 cm and appears to extend >6cm from mid to distal upper arm.  *See table(s) above for measurements and observations.  Diagnosing physician: Monica Martinez MD Electronically signed by Monica Martinez MD on 06/21/2022 at 1:40:27 PM.    Final    CT CHEST WO CONTRAST  Result Date: 06/21/2022 CLINICAL DATA:  Shortness of breath, chest pain. EXAM: CT CHEST WITHOUT CONTRAST TECHNIQUE: Multidetector CT imaging of the chest was performed following the standard protocol without IV contrast. RADIATION DOSE REDUCTION: This exam was performed according to the departmental dose-optimization program which includes automated exposure control, adjustment of the mA and/or kV  according to patient size and/or use of iterative reconstruction technique. COMPARISON:  Radiograph same day.  CT scan of March 08, 2022. FINDINGS: Cardiovascular: Atherosclerosis of thoracic aorta is noted without aneurysm formation. Mild cardiomegaly is noted. Status post mitral and tricuspid valve repair. No pericardial effusion is noted. Right internal jugular catheter is noted with tip in right atrium. Mediastinum/Nodes: No enlarged mediastinal or axillary lymph nodes. Thyroid gland, trachea, and esophagus demonstrate no significant findings. Lungs/Pleura: No pneumothorax or pleural effusion is noted. Bilateral patchy airspace opacities are noted most consistent with multifocal inflammation. Upper Abdomen: No acute abnormality. Musculoskeletal: No chest wall mass or suspicious bone lesions identified. IMPRESSION: Mild bilateral patchy airspace opacities are noted most consistent with multifocal pneumonia. Aortic Atherosclerosis (ICD10-I70.0). Electronically Signed   By: Marijo Conception M.D.   On: 06/21/2022 13:13   DG CHEST PORT 1 VIEW  Result Date: 06/21/2022 CLINICAL DATA:  Cough. EXAM: PORTABLE CHEST 1 VIEW COMPARISON:  June 20, 2022. FINDINGS: Stable cardiomegaly. Right internal jugular dialysis catheter is unchanged in position. Status post cardiac valve repair. No acute pulmonary disease is noted. Bony thorax is unremarkable. IMPRESSION: No acute cardiopulmonary abnormality seen. Electronically Signed   By: Marijo Conception M.D.   On: 06/21/2022 10:21   DG Chest 2 View  Result Date: 06/20/2022 CLINICAL DATA:  Chest pain cough. EXAM: CHEST - 2 VIEW COMPARISON:  June 08, 2022. FINDINGS: RIGHT-sided dialysis catheter terminates in the RIGHT atrium. Median sternotomy changes and evidence of prior LEFT atrial appendage clipping similar to previous imaging. Also with changes of valvular replacement. EKG leads project over the chest. Cardiomediastinal contours and hilar structures are stable  accounting for low lung volumes. There is cardiomegaly. Central pulmonary vascular engorgement. No lobar consolidative process. No pneumothorax. On limited assessment no acute skeletal process. IMPRESSION: 1. Cardiomegaly with central pulmonary vascular engorgement. 2. No frank edema, lobar consolidation or sign of pleural effusion. Correlate with any clinical signs of heart failure or volume overload. 3. Dialysis catheter terminates in the RIGHT atrium. Electronically Signed   By: Zetta Bills M.D.   On: 06/20/2022 13:51    ROS: As per HPI otherwise negative.   Physical Exam: Vitals:   06/21/22 0639 06/21/22 0645 06/21/22 1046 06/21/22 1400  BP:  108/74 101/71 93/63  Pulse:  91 90 94  Resp:  '20 19 16  '$ Temp: 98.6 F (37 C)  (!) 97.3 F (36.3 C) 98 F (36.7 C)  TempSrc: Oral  Oral Oral  SpO2:  100%    Weight:      Height:         General: Obese older female in NAD Head: Normocephalic, atraumatic, sclera non-icteric, mucus membranes are moist Neck: Supple. JVD not elevated. Lungs: Bilateral breath sounds with upper airway rhonchi, decreased in bases. No SOB. Marland Kitchen Heart: RRR with S1  S2. No murmurs, rubs, or gallops appreciated. SR on monitor.  Abdomen: Soft, non-tender, non-distended with normoactive bowel sounds. No rebound/guarding. No obvious abdominal masses. M-S:  Strength and tone appear normal for age. Lower extremities:without edema or ischemic changes, no open wounds  Neuro: Alert and oriented X 3. Moves all extremities spontaneously. Psych:  Responds to questions appropriately with a normal affect. Dialysis Access: L AVF + T/B. Large hematoma present. RIJ TDC drsg intact.   Dialysis Orders: Center: [facility] T,Th,S 4 hrs 180NRe 400/600 130 kgs 3.0 K/2.5 Ca UFP 2 L AVF/RIJ TDC - Heparin 2500 units IV TIW - Hectorol 1 mcg IV TIW - Mircera 50 mcg IV q 2 weeks (last dose 06/20/2022 Next dose due 07/03/2022)   Assessment/Plan:  Acute hypoxic respiratory failure/Possible HCAP. W/U  per primary.   ESRD - T,Th, S. HD tomorrow on schedule. Severe infiltration of AVF. Will use TDC.   Hypertension/volume  - BP and volume OK. UF as tolerated.   Anemia  - HGB 9.7. ESA given 06/20/2022. Follow HGB  Metabolic bone disease -  OP labs at goal. Continue VDRA, no binders.   Nutrition -Renal/Carb mod diet.   DMT2-per primary  Afib-on Eliquis. Currently in SR  Combined systolic/diastolic HF-volume appears fine. Continue to optimize volume on HD. Follow with HF. OSA/COPD-continue CPAP, per primary  Troye Hiemstra H. Owens Shark, NP-C 06/21/2022, 4:08 PM  [employer] [phone]

## 2022-06-21 NOTE — ED Notes (Signed)
Handoff report given to Psychologist, prison and probation services charge at [facility].

## 2022-06-21 NOTE — Plan of Care (Signed)

## 2022-06-21 NOTE — ED Notes (Signed)
Handoff report given to Ferne Coe on 5W at [facility]

## 2022-06-21 NOTE — Progress Notes (Signed)
Upper extremity venous left study completed.  Preliminary results relayed to Nicoletta Dress, MD.  See CV Proc for preliminary results report.   Darlin Coco, RDMS, RVT

## 2022-06-21 NOTE — ED Notes (Signed)
Attempt to call report to [facility] ED charge  No answer

## 2022-06-21 NOTE — Progress Notes (Signed)
  Transition of Care [facility]) Screening Note   Patient Details  Name: [name] Date of Birth: [birth year]   Transition of Care [facility]) CM/SW Contact:    Cyndi Bender, RN Phone Number: 06/21/2022, 9:29 AM    Transition of Care Department [facility]) has reviewed patient and no TOC needs have been identified at this time. We will continue to monitor patient advancement through interdisciplinary progression rounds. If new patient transition needs arise, please place a TOC consult.

## 2022-06-21 NOTE — Telephone Encounter (Signed)
Just confirming that patient dose was not changed on our end correct?

## 2022-06-21 NOTE — Progress Notes (Signed)
Mobility Specialist Progress Note:   06/21/22 1600  Mobility  Activity Ambulated with assistance in hallway  Level of Assistance Standby assist, set-up cues, supervision of patient - no hands on  Assistive Device Four wheel walker  Distance Ambulated (ft) 200 ft  Activity Response Tolerated well  Mobility Referral Yes  $Mobility charge 1 Mobility   Pt eager for mobility session. Required no physical assistance throughout. Required x1 seated rest break during ambulation. Pt back in bed with all needs met.   Nelta Numbers Acute Rehab Secure Chat or Office Phone: [phone]

## 2022-06-21 NOTE — H&P (Signed)
History and Physical    [name] EVO:[id number] DOB: Oct 04, 1955 DOA: 06/20/2022  PCP: [name], NP Patient coming from: [place]   Chief Complaint: fevers and cough  HPI: [name] is a 66 y.o. female with medical history significant of ESRD on HD TTS starting June 2993, chronic systolic and diastolic congestive heart failure followed by HF team, TVR AND MVR in May 2022, A Fib s/p maze procedure in 2022, T2DM, HTN, HLD, DVT on Eliquis, COPD, asthma, sleep apnea on CPAP, obesity BMI 44.89, who was transferred from [facility] ED for fever and cough.  Patient was hospitalized for RSV bronchitis superimposed bacterial infection between 10/19 and 18/24.  She was treated with a 3-day course of ceftriaxone and discharged home. She was not discharged on any oral antibiotics even though patient reports that she received unknown name antibiotic prescription.   Patient reports persistent productive cough with blood-tinged yellow/green sputum, chest congestion, shortness of breath since her discharge.  She reports subjective fevers and chills, and did have temperature of 101F 2 days ago.  Associated symptoms included intermittent chest pain, generalized weakness and fatigue.  Her chest pain was sharp stabbing pain, constant pain, mild to moderate with no radiation.  She reports nothing aggravated or alleviated her chest pain.  Patient went to [facility] ED. in the emergency room, she was noted to be saturating in mid 80s with the slightest exertion.  She was COVID-negative at the ED. WBC 11.6. CXR showed no focal infiltrates. She was started on cefepime and transferred to [facility] for further evaluation.  Denies palpitations, nausea, vomiting, diarrhea, abdomen pain, dysuria, urinary frequency or urgency.  Review of Systems: As per HPI otherwise 10 point review of systems negative.  Review of Systems Otherwise negative except as per HPI, including: General:  Denies night sweats or unintended weight loss.  Positive for fevers, chills, generalized weakness, fatigue Resp: Positive for cough, chest congestion shortness of breath. Cardiac: Denies  palpitations, orthopnea, paroxysmal nocturnal dyspnea.  Positive for chest pain. GI: Denies abdominal pain, nausea, vomiting, diarrhea or constipation GU: Denies dysuria, frequency, hesitancy or incontinence MS: Denies muscle aches, joint pain or swelling Neuro: Denies headache, neurologic deficits (focal weakness, numbness, tingling), abnormal gait Psych: Denies anxiety, depression, SI/HI/AVH Skin: Denies new rashes or lesions ID: Denies sick contacts, exotic exposures, travel  Past Medical History:  Diagnosis Date   Acute on chronic systolic CHF (congestive heart failure) ([place]) 12/21/2020   Allergic rhinitis    Arthritis    Asthma    Chronic diastolic CHF (congestive heart failure) (HCC)    COPD (chronic obstructive pulmonary disease) (HCC)    Depression    DM (diabetes mellitus) ([place])    DVT (deep vein thrombosis) in pregnancy    GERD (gastroesophageal reflux disease)    HTN (hypertension)    Hyperlipidemia    Obesity    Pneumonia 04/2018   RIGHT LOBE   Sleep apnea    compliant with CPAP    Past Surgical History:  Procedure Laterality Date   ABDOMINAL HYSTERECTOMY     AV FISTULA PLACEMENT Left 03/17/2022   Procedure: LEFT ARM ARTERIOVENOUS (AV) FISTULA CREATION;  Surgeon: Angelia Mould, MD;  Location: [place];  Service: Vascular;  Laterality: Left;   BUBBLE STUDY  11/26/2020   Procedure: BUBBLE STUDY;  Surgeon: Buford Dresser, MD;  Location: [place];  Service: Cardiovascular;;   CARDIOVERSION N/A 05/06/2020   Procedure: CARDIOVERSION;  Surgeon: Buford Dresser, MD;  Location: [facility];  Service: Cardiovascular;  Laterality: N/A;   CARDIOVERSION N/A 11/04/2020   Procedure: CARDIOVERSION;  Surgeon: Lelon Perla, MD;  Location: [place];  Service:  Cardiovascular;  Laterality: N/A;   CARDIOVERSION N/A 02/01/2021   Procedure: CARDIOVERSION;  Surgeon: Jolaine Artist, MD;  Location: [place];  Service: Cardiovascular;  Laterality: N/A;   CARDIOVERSION N/A 02/09/2021   Procedure: CARDIOVERSION;  Surgeon: Jolaine Artist, MD;  Location: [place];  Service: Cardiovascular;  Laterality: N/A;   CARDIOVERSION N/A 04/19/2021   Procedure: CARDIOVERSION;  Surgeon: Fay Records, MD;  Location: [place];  Service: Cardiovascular;  Laterality: N/A;   CARDIOVERSION N/A 11/25/2021   Procedure: CARDIOVERSION;  Surgeon: Jolaine Artist, MD;  Location: [place];  Service: Cardiovascular;  Laterality: N/A;   CARDIOVERSION N/A 01/31/2022   Procedure: CARDIOVERSION;  Surgeon: Jolaine Artist, MD;  Location: [place];  Service: Cardiovascular;  Laterality: N/A;   CATARACT EXTRACTION, BILATERAL     CHOLECYSTECTOMY     COLONOSCOPY WITH PROPOFOL N/A 03/05/2015   Procedure: COLONOSCOPY WITH PROPOFOL;  Surgeon: Carol Ada, MD;  Location: [facility];  Service: Endoscopy;  Laterality: N/A;   DIAGNOSTIC LAPAROSCOPY     FISTULA SUPERFICIALIZATION Left 03/17/2022   Procedure: FISTULA SUPERFICIALIZATION -LEFT;  Surgeon: Angelia Mould, MD;  Location: [facility];  Service: Vascular;  Laterality: Left;   ingrown hallux Left    IR FLUORO GUIDE CV LINE RIGHT  03/14/2022   IR US GUIDE VASC ACCESS RIGHT  03/14/2022   KNEE SURGERY     LEFT HEART CATH AND CORONARY ANGIOGRAPHY N/A 12/31/2017   Procedure: LEFT HEART CATH AND CORONARY ANGIOGRAPHY;  Surgeon: Charolette Forward, MD;  Location: [place] CV LAB;  Service: Cardiovascular;  Laterality: N/A;   MAZE N/A 12/29/2020   Procedure: MAZE;  Surgeon: [name], MD;  Location: [place];  Service: Open Heart Surgery;  Laterality: N/A;   MITRAL VALVE REPAIR N/A 12/29/2020   Procedure: MITRAL VALVE REPAIR USING CARBOMEDICS ANNULOFLEX RING SIZE 30;  Surgeon: [name], MD;   Location: [place];  Service: Open Heart Surgery;  Laterality: N/A;   RIGHT HEART CATH N/A 12/22/2020   Procedure: RIGHT HEART CATH;  Surgeon: Larey Dresser, MD;  Location: [place] [facility];  Service: Cardiovascular;  Laterality: N/A;   RIGHT HEART CATH N/A 01/31/2021   Procedure: RIGHT HEART CATH;  Surgeon: Jolaine Artist, MD;  Location: [place] [facility];  Service: Cardiovascular;  Laterality: N/A;   RIGHT HEART CATH N/A 07/29/2021   Procedure: RIGHT HEART CATH;  Surgeon: Jolaine Artist, MD;  Location: [place] CV LAB;  Service: Cardiovascular;  Laterality: N/A;   RIGHT HEART CATH N/A 11/24/2021   Procedure: RIGHT HEART CATH;  Surgeon: Jolaine Artist, MD;  Location: [place] CV [facility];  Service: Cardiovascular;  Laterality: N/A;   RIGHT HEART CATH N/A 02/14/2022   Procedure: RIGHT HEART CATH;  Surgeon: Jolaine Artist, MD;  Location: [place] CV LAB;  Service: Cardiovascular;  Laterality: N/A;   RIGHT HEART CATH N/A 02/22/2022   Procedure: RIGHT HEART CATH;  Surgeon: Larey Dresser, MD;  Location: [place] CV LAB;  Service: Cardiovascular;  Laterality: N/A;   TEE WITHOUT CARDIOVERSION N/A 11/26/2020   Procedure: TRANSESOPHAGEAL ECHOCARDIOGRAM (TEE);  Surgeon: Buford Dresser, MD;  Location: [facility] ENDOSCOPY;  Service: Cardiovascular;  Laterality: N/A;   TEE WITHOUT CARDIOVERSION N/A 12/29/2020   Procedure: TRANSESOPHAGEAL ECHOCARDIOGRAM (TEE);  Surgeon: [name], MD;  Location: [place];  Service: Open Heart Surgery;  Laterality: N/A;   TEE WITHOUT CARDIOVERSION N/A 01/20/2022   Procedure: TRANSESOPHAGEAL ECHOCARDIOGRAM (TEE);  Surgeon: Jolaine Artist, MD;  Location: [place];  Service: Cardiovascular;  Laterality: N/A;   TRICUSPID VALVE REPLACEMENT N/A 12/29/2020   Procedure: 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 RING SIZE 34;  Surgeon: [name], MD;  Location: [place];  Service: Open Heart Surgery;  Laterality: N/A;    SOCIAL  HISTORY:  reports that she has never smoked. She has never been exposed to tobacco smoke. She has never used smokeless tobacco. She reports that she does not drink alcohol and does not use drugs.  Allergies  Allergen Reactions   Bee Pollen Anaphylaxis and Other (See Comments)    UNCONFIRMED, but IS allergic to bee VENOM   Bee Venom Anaphylaxis   Sulfa Antibiotics Itching    FAMILY HISTORY: Family History  Problem Relation Age of Onset   Breast cancer Mother    Cancer Mother    Hypertension Mother    Cancer Father    Hypertension Sister    Hypertension Brother    CAD Other      Prior to Admission medications   Medication Sig Start Date End Date Taking? Authorizing Provider  acetaminophen (TYLENOL) 325 MG tablet Take 2 tablets (650 mg total) by mouth every 6 (six) hours as needed for mild pain or moderate pain. 12/07/21  Yes Arrien, Jimmy Picket, MD  albuterol (VENTOLIN HFA) 108 (90 Base) MCG/ACT inhaler INHALE 1-2 PUFFS BY MOUTH EVERY 6 HOURS AS NEEDED FOR WHEEZE OR SHORTNESS OF BREATH 04/12/22  Yes [name], NP  amiodarone (PACERONE) 200 MG tablet TAKE 1 TABLET BY MOUTH TWICE A DAY 04/25/22  Yes Bensimhon, Shaune Pascal, MD  apixaban (ELIQUIS) 5 MG TABS tablet TAKE ONE TABLET BY MOUTH TWICE DAILY at [place] Patient taking differently: Take 5 mg by mouth in the morning and at bedtime. 02/24/22  Yes Shelly Coss, MD  benzonatate (TESSALON PERLES) 100 MG capsule Take 1 capsule (100 mg total) by mouth 3 (three) times daily as needed for cough. 05/23/22  Yes [name], NP  busPIRone (BUSPAR) 5 MG tablet Take 1 tablet (5 mg total) by mouth 2 (two) times daily. 05/14/22  Yes [name], NP  colchicine 0.6 MG tablet TAKE 0.5 TABLETS BY MOUTH DAILY. 04/03/22  Yes Charlott Rakes, MD  diphenhydrAMINE (BENADRYL) 25 mg capsule Take 25 mg by mouth every 6 (six) hours as needed for allergies or itching.   Yes [provider]  DULoxetine (CYMBALTA) 60 MG capsule  Take 1 capsule (60 mg total) by mouth daily. 05/08/22  Yes [name], NP  fluticasone (FLONASE) 50 MCG/ACT nasal spray Place 2 sprays into both nostrils daily as needed for allergies or rhinitis. 04/25/22  Yes [name], NP  insulin aspart (NOVOLOG) 100 UNIT/ML injection Inject 2-10 Units into the skin 3 (three) times daily before meals. Per sliding scale Patient taking differently: Inject 2-10 Units into the skin See admin instructions. Inject 2-10 units into the skin three times a day before meals, per sliding scale 09/02/21  Yes Aline August, MD  isosorbide mononitrate (IMDUR) 30 MG 24 hr tablet Take 0.5 tablets (15 mg total) by mouth daily. 03/18/22 06/20/22 Yes Arrien, Jimmy Picket, MD  KLOR-CON M20 20 MEQ tablet Take 20 mEq by mouth 2 (two) times daily. 04/18/22  Yes [provider]  lidocaine-prilocaine (EMLA) cream SMARTSIG:sparingly Topical 3 Times a Week  06/01/22  Yes [provider]  LINZESS 72 MCG capsule TAKE 1 CAPSULE (72 MCG TOTAL) BY MOUTH DAILY BEFORE BREAKFAST. IF BM NOT ACHIEVE WITH 1 CAPSULE SHE MAY TAKE 2 DAILY. STOP IF DIARRHEA OCCURS 04/25/22  Yes [name], NP  loratadine (CLARITIN) 10 MG tablet Take 1 tablet (10 mg total) by mouth daily. 12/29/21  Yes [name], NP  metolazone (ZAROXOLYN) 2.5 MG tablet Take 2.5 mg by mouth 2 (two) times a week. 04/20/22  Yes [provider]  metoprolol succinate (TOPROL-XL) 25 MG 24 hr tablet Take 1 tablet (25 mg total) by mouth daily. 03/18/22 06/20/22 Yes Arrien, Jimmy Picket, MD  Multiple Vitamins-Minerals (MULTIVITAMIN WOMEN PO) Take 1 tablet by mouth daily.   Yes [provider]  multivitamin (RENA-VIT) TABS tablet Take 1 tablet by mouth daily. 04/27/22  Yes [provider]  nitroGLYCERIN (NITROSTAT) 0.4 MG SL tablet Place 1 tablet (0.4 mg total) under the tongue every 5 (five) minutes x 3 doses as needed for chest pain. 01/08/14  Yes Charolette Forward, MD   pantoprazole (PROTONIX) 40 MG tablet TAKE ONE TABLET BY MOUTH DAILY AT 9AM 04/17/22  Yes [name], NP  polyethylene glycol powder (GLYCOLAX/MIRALAX) 17 GM/SCOOP powder Take 17 g by mouth daily. Patient taking differently: Take 17 g by mouth daily as needed for mild constipation. 02/25/22  Yes Shelly Coss, MD  pregabalin (LYRICA) 100 MG capsule TAKE 1 CAPSULE BY MOUTH THREE TIMES A DAY Patient taking differently: Take 100 mg by mouth 3 (three) times daily as needed (for pain). 02/26/22  Yes [name], NP  RESTASIS 0.05 % ophthalmic emulsion Place 1 drop into both eyes in the morning and at bedtime. 11/19/17  Yes [provider]  rosuvastatin (CRESTOR) 10 MG tablet TAKE ONE TABLET BY MOUTH DAILY AT 5PM 05/18/22  Yes Joette Catching, PA-C  senna-docusate (SENOKOT-S) 8.6-50 MG tablet Take 1 tablet by mouth 2 (two) times daily. 02/24/22  Yes Shelly Coss, MD  torsemide (DEMADEX) 100 MG tablet Take 1 tablet by mouth. Monday Wednesday and Friday 04/02/22  Yes [provider]  TRULICITY 1.5 LP/3.7TK SOPN INJECT 1.5 MG INTO THE SKIN ONCE A WEEK. Patient taking differently: Inject 1.5 mg into the skin every Wednesday. 02/26/22  Yes [name], NP  EPINEPHrine 0.3 mg/0.3 mL IJ SOAJ injection Inject 0.3 mg into the muscle as needed for anaphylaxis. 06/25/20   [name], NP  glucose blood (ACCU-CHEK GUIDE) test strip USE TO CHECK BLOOD SUGAR 3 TIMES DAILY 04/03/22   Charlott Rakes, MD  guaiFENesin 200 MG tablet Take 1 tablet (200 mg total) by mouth every 4 (four) hours as needed for cough or to loosen phlegm. Patient not taking: Reported on 06/20/2022 05/23/22   [name], NP  lidocaine (XYLOCAINE) 5 % ointment Apply 1 application. topically as needed for moderate pain. First choice is lidocaine patch, but if not covered/expensive, please fill ointment Patient not taking: Reported on 06/20/2022 11/08/21   Buford Dresser, MD  mupirocin  ointment (BACTROBAN) 2 % Apply to affected area of feet once daily. Patient not taking: Reported on 06/20/2022 06/07/22   Marzetta Board, DPM    Physical Exam: Vitals:   06/21/22 0515 06/21/22 0639 06/21/22 0645 06/21/22 1046  BP: 121/67  108/74 101/71  Pulse: 88  91 90  Resp: '18  20 19  '$ Temp:  98.6 F (37 C)  (!) 97.3 F (36.3 C)  TempSrc:  Oral  Oral  SpO2: 97%  100%   Weight:      Height:          Constitutional: NAD, calm, comfortable.  Acute ill-appearing.  Chronic ill-appearing.  Obese. Eyes: PERRL, lids and conjunctivae normal ENMT: Mucous membranes are moist. Posterior pharynx clear of any exudate or lesions.Normal dentition.  Neck: normal, supple, no masses, no thyromegaly Respiratory: Bilateral lungs sound diminished with expiratory rhonchi's.  No wheezing or rails.  No crackles. Normal respiratory effort. No accessory muscle use.  Cardiovascular: Regular rate and rhythm, no murmurs / rubs / gallops. No extremity edema. 2+ pedal pulses. No carotid bruits.  Abdomen: no tenderness, no masses palpated. No hepatosplenomegaly. Bowel sounds positive.  Musculoskeletal: no clubbing / cyanosis. No joint deformity upper and lower extremities. Good ROM, no contractures. Normal muscle tone.  Skin: no rashes, lesions, ulcers. No induration. Left arm AVF area swelling and bruising. No erythema, warmth to touch ot TTP Neurologic: CN 2-12 grossly intact. Sensation intact, DTR normal. Strength 5/5 in all 4.  Psychiatric: Normal judgment and insight. Alert and oriented x 3. Normal mood.     Labs on Admission: I have personally reviewed following labs and imaging studies  CBC: Recent Labs  Lab 06/20/22 1228 06/20/22 1254  WBC 5.7  --   NEUTROABS 3.2  --   HGB 10.3* 11.6*  HCT 32.1* 34.0*  MCV 94.4  --   PLT 182  --    Basic Metabolic Panel: Recent Labs  Lab 06/20/22 1228 06/20/22 1254  Lillybeth Tal 136 138  K 3.3* 3.4*  CL 94*  --   CO2 34*  --   GLUCOSE 125*  --   BUN  27*  --   CREATININE 5.38*  --   CALCIUM 9.4  --    GFR: Estimated Creatinine Clearance: 14.6 mL/min (A) (by C-G formula based on SCr of 5.38 mg/dL (H)). Liver Function Tests: Recent Labs  Lab 06/20/22 1228  AST 21  ALT 20  ALKPHOS 105  BILITOT 0.6  PROT 8.8*  ALBUMIN 4.0   No results for input(s): "LIPASE", "AMYLASE" in the last 168 hours. No results for input(s): "AMMONIA" in the last 168 hours. Coagulation Profile: No results for input(s): "INR", "PROTIME" in the last 168 hours. Cardiac Enzymes: No results for input(s): "CKTOTAL", "CKMB", "CKMBINDEX", "TROPONINI" in the last 168 hours. BNP (last 3 results) No results for input(s): "PROBNP" in the last 8760 hours. HbA1C: No results for input(s): "HGBA1C" in the last 72 hours. CBG: Recent Labs  Lab 06/21/22 0819  GLUCAP 92   Lipid Profile: No results for input(s): "CHOL", "HDL", "LDLCALC", "TRIG", "CHOLHDL", "LDLDIRECT" in the last 72 hours. Thyroid Function Tests: No results for input(s): "TSH", "T4TOTAL", "FREET4", "T3FREE", "THYROIDAB" in the last 72 hours. Anemia Panel: No results for input(s): "VITAMINB12", "FOLATE", "FERRITIN", "TIBC", "IRON", "RETICCTPCT" in the last 72 hours. Urine analysis:    Component Value Date/Time   COLORURINE YELLOW 03/12/2022 1340   APPEARANCEUR HAZY (A) 03/12/2022 1340   LABSPEC 1.015 03/12/2022 1340   PHURINE 5.0 03/12/2022 1340   GLUCOSEU NEGATIVE 03/12/2022 1340   HGBUR SMALL (A) 03/12/2022 1340   BILIRUBINUR NEGATIVE 03/12/2022 1340   BILIRUBINUR negative 09/24/2019 1742   KETONESUR NEGATIVE 03/12/2022 1340   PROTEINUR NEGATIVE 03/12/2022 1340   UROBILINOGEN 1.0 09/24/2019 1742   UROBILINOGEN 1.0 10/26/2014 0249   NITRITE NEGATIVE 03/12/2022 1340   LEUKOCYTESUR NEGATIVE 03/12/2022 1340   Sepsis Labs: !!!!!!!!!!!!!!!!!!!!!!!!!!!!!!!!!!!!!!!!!!!! '@LABRCNTIP'$ (procalcitonin:4,lacticidven:4) ) Recent Results (from the past 240 hour(s))  SARS Coronavirus 2  by RT PCR  (hospital order, performed in [facility] hospital lab) *cepheid single result test* Anterior Nasal Swab     Status: None   Collection Time: 06/20/22 12:51 PM   Specimen: Anterior Nasal Swab  Result Value Ref Range Status   SARS Coronavirus 2 by RT PCR NEGATIVE NEGATIVE Final    Comment: (NOTE) SARS-CoV-2 target nucleic acids are NOT DETECTED.  The SARS-CoV-2 RNA is generally detectable in upper and lower respiratory specimens during the acute phase of infection. The lowest concentration of SARS-CoV-2 viral copies this assay can detect is 250 copies / mL. A negative result does not preclude SARS-CoV-2 infection and should not be used as the sole basis for treatment or other patient management decisions.  A negative result may occur with improper specimen collection / handling, submission of specimen other than nasopharyngeal swab, presence of viral mutation(s) within the areas targeted by this assay, and inadequate number of viral copies (<250 copies / mL). A negative result must be combined with clinical observations, patient history, and epidemiological information.  Fact Sheet for Patients:   https://www.patel.info/  Fact Sheet for Healthcare Providers: https://hall.com/  This test is not yet approved or  cleared by the [place] FDA and has been authorized for detection and/or diagnosis of SARS-CoV-2 by FDA under an Emergency Use Authorization (EUA).  This EUA will remain in effect (meaning this test can be used) for the duration of the COVID-19 declaration under Section 564(b)(1) of the Act, 21 U.S.C. section 360bbb-3(b)(1), unless the authorization is terminated or revoked sooner.  Performed at [facility], [address] [place], [place] [address]   MRSA Next Gen by PCR, Nasal     Status: None   Collection Time: 06/20/22  4:01 PM   Specimen: Nasal Mucosa; Nasal Swab  Result Value Ref Range Status    MRSA by PCR Next Gen NOT DETECTED NOT DETECTED Final    Comment: (NOTE) The GeneXpert MRSA Assay (FDA approved for NASAL specimens only), is one component of a comprehensive MRSA colonization surveillance program. It is not intended to diagnose MRSA infection nor to guide or monitor treatment for MRSA infections. Test performance is not FDA approved in patients less than 66 years old. Performed at [facility], [place] [address] [place], [place] [address]      Radiological Exams on Admission: DG CHEST PORT 1 VIEW  Result Date: 06/21/2022 CLINICAL DATA:  Cough. EXAM: PORTABLE CHEST 1 VIEW COMPARISON:  June 20, 2022. FINDINGS: Stable cardiomegaly. Right internal jugular dialysis catheter is unchanged in position. Status post cardiac valve repair. No acute pulmonary disease is noted. Bony thorax is unremarkable. IMPRESSION: No acute cardiopulmonary abnormality seen. Electronically Signed   By: Marijo Conception M.D.   On: 06/21/2022 10:21   DG Chest 2 View  Result Date: 06/20/2022 CLINICAL DATA:  Chest pain cough. EXAM: CHEST - 2 VIEW COMPARISON:  June 08, 2022. FINDINGS: RIGHT-sided dialysis catheter terminates in the RIGHT atrium. Median sternotomy changes and evidence of prior LEFT atrial appendage clipping similar to previous imaging. Also with changes of valvular replacement. EKG leads project over the chest. Cardiomediastinal contours and hilar structures are stable accounting for low lung volumes. There is cardiomegaly. Central pulmonary vascular engorgement. No lobar consolidative process. No pneumothorax. On limited assessment no acute skeletal process. IMPRESSION: 1. Cardiomegaly with central pulmonary vascular engorgement. 2. No frank edema, lobar consolidation or sign of pleural effusion. Correlate with any clinical signs of heart failure or volume overload. 3.  Dialysis catheter terminates in the RIGHT atrium. Electronically Signed   By: Zetta Bills M.D.   On: 06/20/2022 13:51      All images have been reviewed by me personally.  EKG: Independently reviewed.   Assessment/Plan Active Problems:   AF (paroxysmal atrial fibrillation) (HCC)   Essential hypertension   Diabetes mellitus type 2 in obese (HCC)   COPD (chronic obstructive pulmonary disease) (HCC)   Class 3 obesity (HCC)   Chronic atrial fibrillation (HCC)   S/P mitral valve repair   S/P tricuspid valve repair   Chronic deep vein thrombosis (DVT) of other vein of right upper extremity (HCC)   Acute hypoxic respiratory failure (HCC)   HCAP (healthcare-associated pneumonia)   ESRD on dialysis (HCC)   OSA on CPAP     Assessment and Plan: Assessment Plan  # acute hypoxia respiratory failure, resolved # probably HCAP # recent RSV bronchitis with superimposed bacterial infection  Clinical manifestations suggestive of either recurrent viral URI versus probable HCAP.   -Telemetry monitoring -COVID-negative -Respiratory viral panel pending -Infectious work-up -MRSA negative -Chest CT without contrast pending -Empiric cefepime pending above infectious work-up - per chart review, she was reported to have O2 sat in mid 80's at OSH ED, which is resolved now. She is not hypoxia currently   # DVT on Eliquis  -Continue Eliquis  # T2DM # obesity BMI 44.89,  -Glucose ACH -Renal diabetic diet -Weight loss as outpatient per PCP  # HTN and HLD  -Chronic and stable  # ESRD on HD TTS starting June 2023,  # Left arm AVF area swelling and bruising since yesterday per pt  - LUE Doppler to r/o thrombosis - Nephrology consult  # chronic systolic and diastolic congestive heart failure followed by HF team # TVR AND MVR in May 2022 # A Fib s/p maze procedure in 2022  - euvolemic on exam - EKG- NSR  # COPD, asthma, sleep apnea on CPAP - no evidence of exacerbation - continue home CPAP      DVT prophylaxis: Eliquis Code Status: Full code Family Communication: N/A at bedside Consults called:  Nephrology Admission status: inpt  Status is: Inpatient Remains inpatient appropriate because: needs for 2 day stay   Time Spent: 65 minutes.  >50% of the time was devoted to discussing the patients care, assessment, plan and disposition with other care givers along with counseling the patient about the risks and benefits of treatment.    Charlann Lange MD [facility]  If 7PM-7AM, please contact night-coverage   06/21/2022, 11:36 AM

## 2022-06-21 NOTE — ED Notes (Signed)
Handoff report given to [facility] 

## 2022-06-21 NOTE — Progress Notes (Signed)
Pharmacy Antibiotic Note  [name] is a 66 y.o. female admitted on 06/20/2022 with pneumonia.  Pharmacy has been consulted for vancomycin and cefepime dosing. Pt with recent admission to [facility] found to have RSV PNA. Pt taking doxycycline 100 BID x7 days (10/18-10/24). Presented with cough, SHOB, and WBC 5.7, afeb (although reported fevers PTA).   Vancomycin 2500 mg IV loading dose given on 10/31 PM. Vancomycin random level  ~ 12hr post dose = 35 mcg/ml.  Okay to give vancomycin after next HD session.  Plan is for HD tomorrow 06/22/22 , per normal TTS schedule  10/31 MRSA:  negative    Goal pre HD vancomycin level =15-25 mcg/ml Plan: Vancomycin  '1000mg'$  IV qHD - TTS,  dose due 11/2 post HD Cefepime 1g IV q24h  F/u hemodialysis schedule Vancomycin levels as indicated F/u renal function, CBC, cultures F/u MRSA PCR  Height: '5\' 7"'$  (170.2 cm) Weight: 130 kg (286 lb 9.6 oz) IBW/kg (Calculated) : 61.6  Temp (24hrs), Avg:98.1 F (36.7 C), Min:97.3 F (36.3 C), Max:98.6 F (37 C)  Recent Labs  Lab 06/20/22 1228 06/21/22 0533 06/21/22 1100  WBC 5.7  --  6.1  CREATININE 5.38*  --  7.19*  VANCORANDOM  --  35  --      Estimated Creatinine Clearance: 11 mL/min (A) (by C-G formula based on SCr of 7.19 mg/dL (H)).    Allergies  Allergen Reactions   Bee Pollen Anaphylaxis and Other (See Comments)    UNCONFIRMED, but IS allergic to bee VENOM   Bee Venom Anaphylaxis   Sulfa Antibiotics Itching    Antimicrobials this admission: 10/31 vanc x1 10/31 cefepime >>   Dose adjustments this admission:   Microbiology results: 10/31 MRSA PCR:  negative   Thank you for allowing pharmacy to be a part of this patient's care.   Threasa Alpha Clinical Pharmacist 06/21/2022 5:35 PM

## 2022-06-21 NOTE — ED Notes (Signed)
Left upper arm graft swollen and bruised.  Patient states painfully to touch.  Slight pulsation felt.

## 2022-06-21 NOTE — Telephone Encounter (Signed)
  Pt c/o medication issue:  1. Name of Medication: torsemide (DEMADEX) 100 MG tablet  2. How are you currently taking this medication (dosage and times per day)? Take 1 tablet by mouth. Monday Wednesday and Friday  3. Are you having a reaction (difficulty breathing--STAT)? No   4. What is your medication issue?  Nicole Cella - Pharmacist [facility] hospital calling, she need to clarify torsemide dosage, she said, per pt her dosage has been decrease

## 2022-06-22 DIAGNOSIS — R5383 Other fatigue: Secondary | ICD-10-CM

## 2022-06-22 DIAGNOSIS — M6281 Muscle weakness (generalized): Secondary | ICD-10-CM

## 2022-06-22 LAB — RENAL FUNCTION PANEL
Albumin: 3.2 g/dL — ABNORMAL LOW (ref 3.5–5.0)
Anion gap: 12 (ref 5–15)
BUN: 53 mg/dL — ABNORMAL HIGH (ref 8–23)
CO2: 29 mmol/L (ref 22–32)
Calcium: 9.6 mg/dL (ref 8.9–10.3)
Chloride: 96 mmol/L — ABNORMAL LOW (ref 98–111)
Creatinine, Ser: 8.99 mg/dL — ABNORMAL HIGH (ref 0.44–1.00)
GFR, Estimated: 4 mL/min — ABNORMAL LOW (ref >60)
Glucose, Bld: 108 mg/dL — ABNORMAL HIGH (ref 70–99)
Phosphorus: 5.9 mg/dL — ABNORMAL HIGH (ref 2.5–4.6)
Potassium: 3.8 mmol/L (ref 3.5–5.1)
Sodium: 137 mmol/L (ref 135–145)

## 2022-06-22 LAB — CBC
HCT: 28.9 % — ABNORMAL LOW (ref 36.0–46.0)
Hemoglobin: 9.1 g/dL — ABNORMAL LOW (ref 12.0–15.0)
MCH: 30.3 pg (ref 26.0–34.0)
MCHC: 31.5 g/dL (ref 30.0–36.0)
MCV: 96.3 fL (ref 80.0–100.0)
Platelets: 172 10*3/uL (ref 150–400)
RBC: 3 MIL/uL — ABNORMAL LOW (ref 3.87–5.11)
RDW: 14.8 % (ref 11.5–15.5)
WBC: 6.9 10*3/uL (ref 4.0–10.5)
nRBC: 0 % (ref 0.0–0.2)

## 2022-06-22 MED ORDER — HEPARIN SODIUM (PORCINE) 1000 UNIT/ML DIALYSIS: 1000.0000 [IU] | INTRAMUSCULAR | Status: DC | PRN

## 2022-06-22 MED ORDER — HYDROMORPHONE HCL 1 MG/ML IJ SOLN: 0.5000 mg | INTRAMUSCULAR | Status: DC | PRN

## 2022-06-22 MED ORDER — HEPARIN SODIUM (PORCINE) 1000 UNIT/ML IJ SOLN
INTRAMUSCULAR | Status: AC
  Administered 2022-06-22: 1000 [IU]
  Filled 2022-06-22: qty 3

## 2022-06-22 MED ORDER — ANTICOAGULANT SODIUM CITRATE 4% (200MG/5ML) IV SOLN
5.0000 mL | Status: DC | PRN
  Filled 2022-06-22: qty 5

## 2022-06-22 MED ORDER — OXYCODONE HCL 5 MG PO TABS
5.0000 mg | ORAL_TABLET | ORAL | Status: DC | PRN
  Administered 2022-06-22: 5 mg via ORAL
  Filled 2022-06-22: qty 1

## 2022-06-22 MED ORDER — PENTAFLUOROPROP-TETRAFLUOROETH EX AERO: 1.0000 | INHALATION_SPRAY | CUTANEOUS | Status: DC | PRN

## 2022-06-22 MED ORDER — LIDOCAINE HCL (PF) 1 % IJ SOLN: 5.0000 mL | INTRAMUSCULAR | Status: DC | PRN

## 2022-06-22 MED ORDER — ALTEPLASE 2 MG IJ SOLR: 2.0000 mg | Freq: Once | INTRAMUSCULAR | Status: DC | PRN

## 2022-06-22 MED ORDER — HEPARIN SODIUM (PORCINE) 1000 UNIT/ML DIALYSIS: 2500.0000 [IU] | Freq: Once | INTRAMUSCULAR | Status: DC

## 2022-06-22 MED ORDER — LIDOCAINE-PRILOCAINE 2.5-2.5 % EX CREA: 1.0000 | TOPICAL_CREAM | CUTANEOUS | Status: DC | PRN

## 2022-06-22 MED ORDER — TORSEMIDE 20 MG PO TABS
100.0000 mg | ORAL_TABLET | ORAL | Status: DC
  Filled 2022-06-22: qty 1

## 2022-06-22 MED ORDER — HEPARIN SODIUM (PORCINE) 1000 UNIT/ML IJ SOLN
INTRAMUSCULAR | Status: AC
  Filled 2022-06-22: qty 4

## 2022-06-22 MED ORDER — TORSEMIDE 20 MG PO TABS
100.0000 mg | ORAL_TABLET | ORAL | Status: DC
  Administered 2022-06-23: 100 mg via ORAL
  Filled 2022-06-22: qty 5

## 2022-06-22 NOTE — Progress Notes (Signed)
[name] Progress Note   Subjective: Sitting up at bedside. Still C/O coughing, denies SOB. AVF still tender to touch, swollen but bruit present. HD later today.   Objective Vitals:   06/21/22 2333 06/21/22 2355 06/22/22 0418 06/22/22 0912  BP: (!) 84/70 112/64 123/67 (!) 91/54  Pulse: 88 86 85 85  Resp: '18 17 18 19  '$ Temp: (!) 97.5 F (36.4 C)  98.4 F (36.9 C) 97.6 F (36.4 C)  TempSrc: Oral  Oral Oral  SpO2: 96% 94% 94% 96%  Weight:      Height:       General: Obese older female in NAD Lungs: Bilateral breath sounds CTAB, still decreased in bases. No SOB. Marland Kitchen Heart: RRR with S1 S2. No murmurs, rubs, or gallops appreciated. SR on monitor.  Abdomen: Obese, NABS Lower extremities:Trace BLE edema. Neuro: Alert and oriented X 3. Moves all extremities spontaneously. Dialysis Access: L AVF + T/B. Large hematoma present. RIJ TDC drsg intact.   Dialysis Orders:  Additional Objective Labs: Basic Metabolic Panel: Recent Labs  Lab 06/20/22 1228 06/20/22 1254 06/21/22 1100  NA 136 138 139  K 3.3* 3.4* 3.9  CL 94*  --  96*  CO2 34*  --  29  GLUCOSE 125*  --  91  BUN 27*  --  37*  CREATININE 5.38*  --  7.19*  CALCIUM 9.4  --  9.5   Liver Function Tests: Recent Labs  Lab 06/20/22 1228  AST 21  ALT 20  ALKPHOS 105  BILITOT 0.6  PROT 8.8*  ALBUMIN 4.0   No results for input(s): "LIPASE", "AMYLASE" in the last 168 hours. CBC: Recent Labs  Lab 06/20/22 1228 06/20/22 1254 06/21/22 1100  WBC 5.7  --  6.1  NEUTROABS 3.2  --  3.3  HGB 10.3* 11.6* 9.7*  HCT 32.1* 34.0* 30.3*  MCV 94.4  --  94.4  PLT 182  --  169   Blood Culture    Component Value Date/Time   SDES URINE, CLEAN CATCH 02/13/2022 0809   SPECREQUEST  02/13/2022 0809    NONE Performed at [facility] [address] [place], [place] [address]    CULT (A) 02/13/2022 0809    >=100,000 COLONIES/mL ESCHERICHIA COLI 60,000 COLONIES/mL ENTEROBACTER CLOACAE    REPTSTATUS  02/15/2022 FINAL 02/13/2022 0809    Cardiac Enzymes: No results for input(s): "CKTOTAL", "CKMB", "CKMBINDEX", "TROPONINI" in the last 168 hours. CBG: Recent Labs  Lab 06/21/22 0819  GLUCAP 92   Iron Studies: No results for input(s): "IRON", "TIBC", "TRANSFERRIN", "FERRITIN" in the last 72 hours. '@lablastinr3'$ @ Studies/Results: VAS [place] UPPER EXTREMITY VENOUS DUPLEX  Result Date: 06/21/2022 UPPER VENOUS STUDY  Patient Name:  [name]  Date of Exam:   06/21/2022 Medical Rec #: [id number]       Accession #:    [id number] Date of Birth: 21-May-1956       Patient Gender: F Patient Age:   66 years Exam Location:  [facility] Procedure:      VAS [place] UPPER EXTREMITY VENOUS DUPLEX Referring Phys: NA LI --------------------------------------------------------------------------------  Indications: Pain and swelling of left upper arm, site of brachio-cephalic AVF Other Indications: History of DVT lower extremity. Risk Factors: Surgery 03-17-2022 Left brachio-cephalic AVF creation. Anticoagulation: Eliquis. Comparison Study: 05-04-2022 Prior duplex dialysis access showed patent AVF Performing Technologist: Darlin Coco RDMS, RVT  Examination Guidelines: A complete evaluation includes B-mode imaging, spectral Doppler, color Doppler, and power Doppler as needed of all accessible portions  of each vessel. Bilateral testing is considered an integral part of a complete examination. Limited examinations for reoccurring indications may be performed as noted.  Right Findings: +----------+------------+---------+-----------+----------+-------+ RIGHT     CompressiblePhasicitySpontaneousPropertiesSummary +----------+------------+---------+-----------+----------+-------+ Subclavian               Yes       Yes                      +----------+------------+---------+-----------+----------+-------+  Left Findings: +----------+------------+---------+-----------+----------+----------+ LEFT       CompressiblePhasicitySpontaneousProperties Summary   +----------+------------+---------+-----------+----------+----------+ IJV                      Yes       Yes                         +----------+------------+---------+-----------+----------+----------+ Subclavian               Yes       Yes                         +----------+------------+---------+-----------+----------+----------+ Axillary      Full       Yes       Yes                         +----------+------------+---------+-----------+----------+----------+ Brachial      Full                                             +----------+------------+---------+-----------+----------+----------+ Radial        Full                                             +----------+------------+---------+-----------+----------+----------+ Ulnar         Full                                             +----------+------------+---------+-----------+----------+----------+ Cephalic                                            Patent AVF +----------+------------+---------+-----------+----------+----------+ Basilic       Full                                             +----------+------------+---------+-----------+----------+----------+  Summary:  Right: No evidence of thrombosis in the subclavian.  Left: No evidence of deep vein thrombosis in the upper extremity. No evidence of superficial vein thrombosis in the upper extremity.  Incidental: Avascular, heterogenous focal collection noted at the mid upper arm, deep to the cephalic AVF. Measures 2.2 cm and appears to extend >6cm from mid to distal upper arm.  *See table(s) above for measurements and observations.  Diagnosing physician: Monica Martinez MD Electronically signed by Monica Martinez MD on 06/21/2022 at 1:40:27 PM.    Final  CT CHEST WO CONTRAST  Result Date: 06/21/2022 CLINICAL DATA:  Shortness of breath, chest pain. EXAM: CT CHEST WITHOUT CONTRAST  TECHNIQUE: Multidetector CT imaging of the chest was performed following the standard protocol without IV contrast. RADIATION DOSE REDUCTION: This exam was performed according to the departmental dose-optimization program which includes automated exposure control, adjustment of the mA and/or kV according to patient size and/or use of iterative reconstruction technique. COMPARISON:  Radiograph same day.  CT scan of March 08, 2022. FINDINGS: Cardiovascular: Atherosclerosis of thoracic aorta is noted without aneurysm formation. Mild cardiomegaly is noted. Status post mitral and tricuspid valve repair. No pericardial effusion is noted. Right internal jugular catheter is noted with tip in right atrium. Mediastinum/Nodes: No enlarged mediastinal or axillary lymph nodes. Thyroid gland, trachea, and esophagus demonstrate no significant findings. Lungs/Pleura: No pneumothorax or pleural effusion is noted. Bilateral patchy airspace opacities are noted most consistent with multifocal inflammation. Upper Abdomen: No acute abnormality. Musculoskeletal: No chest wall mass or suspicious bone lesions identified. IMPRESSION: Mild bilateral patchy airspace opacities are noted most consistent with multifocal pneumonia. Aortic Atherosclerosis (ICD10-I70.0). Electronically Signed   By: Marijo Conception M.D.   On: 06/21/2022 13:13   DG CHEST PORT 1 VIEW  Result Date: 06/21/2022 CLINICAL DATA:  Cough. EXAM: PORTABLE CHEST 1 VIEW COMPARISON:  June 20, 2022. FINDINGS: Stable cardiomegaly. Right internal jugular dialysis catheter is unchanged in position. Status post cardiac valve repair. No acute pulmonary disease is noted. Bony thorax is unremarkable. IMPRESSION: No acute cardiopulmonary abnormality seen. Electronically Signed   By: Marijo Conception M.D.   On: 06/21/2022 10:21   DG Chest 2 View  Result Date: 06/20/2022 CLINICAL DATA:  Chest pain cough. EXAM: CHEST - 2 VIEW COMPARISON:  June 08, 2022. FINDINGS: RIGHT-sided  dialysis catheter terminates in the RIGHT atrium. Median sternotomy changes and evidence of prior LEFT atrial appendage clipping similar to previous imaging. Also with changes of valvular replacement. EKG leads project over the chest. Cardiomediastinal contours and hilar structures are stable accounting for low lung volumes. There is cardiomegaly. Central pulmonary vascular engorgement. No lobar consolidative process. No pneumothorax. On limited assessment no acute skeletal process. IMPRESSION: 1. Cardiomegaly with central pulmonary vascular engorgement. 2. No frank edema, lobar consolidation or sign of pleural effusion. Correlate with any clinical signs of heart failure or volume overload. 3. Dialysis catheter terminates in the RIGHT atrium. Electronically Signed   By: Zetta Bills M.D.   On: 06/20/2022 13:51   Medications:  ceFEPime (MAXIPIME) IV Stopped (06/21/22 1742)   vancomycin      amiodarone  200 mg Oral BID   apixaban  5 mg Oral BID   busPIRone  5 mg Oral BID   Chlorhexidine Gluconate Cloth  6 each Topical Daily   colchicine  0.6 mg Oral Daily   doxercalciferol  1 mcg Intravenous Q T,Th,Sa-HD   DULoxetine  60 mg Oral Daily   guaiFENesin  600 mg Oral BID   isosorbide mononitrate  15 mg Oral Daily   metoprolol succinate  25 mg Oral Daily   multivitamin with minerals  1 tablet Oral Daily   senna-docusate  1 tablet Oral BID     Dialysis Orders: Center: [facility] T,Th,S 4 hrs 180NRe 400/600 130 kgs 3.0 K/2.5 Ca UFP 2 L AVF/RIJ TDC - Heparin 2500 units IV TIW - Hectorol 1 mcg IV TIW - Mircera 50 mcg IV q 2 weeks (last dose 06/20/2022 Next dose due 07/03/2022)     Assessment/Plan:  Acute  hypoxic respiratory failure/Possible HCAP. W/U per primary.   ESRD - T,Th, S. HD tomorrow on schedule. Severe infiltration of AVF. Will use TDC.   Hypertension/volume  - BP and volume OK. UF as tolerated.   Anemia  - HGB 9.7. ESA given 06/20/2022. Follow HGB  Metabolic bone disease -  OP labs at  goal. Continue VDRA, no binders.   Nutrition -Renal/Carb mod diet.   DMT2-per primary  Afib-on Eliquis. Currently in SR  Combined systolic/diastolic HF-volume appears fine. Continue to optimize volume on HD. Follow with HF. OSA/COPD-continue CPAP, per primary  Selinda Korzeniewski H. Jaydien Panepinto NP-C 06/22/2022, 11:42 AM  [employer] [phone]

## 2022-06-22 NOTE — Progress Notes (Signed)
Received patient in bed to unit.  Alert and oriented.  Informed consent signed and in chart.   Treatment initiated: 1252 Treatment completed: 1658  Patient tolerated well.  Transported back to the room  Alert, without acute distress.  Hand-off given to patient's nurse.   Access used: Catheter Access issues: none  Total UF removed: 2600 Medication(s) given: Hectorol, Heparin bolus Post HD VS: 98.7, 155/89(98), HR-85, RR-15, SP02-100 Post HD weight: 130.3kg   [name]

## 2022-06-22 NOTE — Progress Notes (Signed)
Mobility Specialist Progress Note:   06/22/22 1125  Mobility  Activity Ambulated with assistance in hallway  Level of Assistance Standby assist, set-up cues, supervision of patient - no hands on  Assistive Device Four wheel walker  Distance Ambulated (ft) 200 ft  Activity Response Tolerated well  Mobility Referral Yes  $Mobility charge 1 Mobility   Pt agreeable to mobility session prior to HD, with encouragement. Required no physical assistance throughout, no rest breaks taken. SpO2 at or above 90% on RA with unreliable pleth. Pt left sitting EOB with all needs met.   Nelta Numbers Acute Rehab Secure Chat or Office Phone: [phone]

## 2022-06-22 NOTE — Progress Notes (Signed)
PROGRESS NOTE  [name]  DOB: 1956-02-02  PCP: [name], NP CWU:[id number]  DOA: 06/20/2022  LOS: 1 day  Hospital Day: 3  Brief narrative: [name] is a 66 y.o. female with PMH significant for ESRD-HD-TTS since 01/2022, morbid obesity, OSA on CPAP, DM 2, HTN, HLD, chronic combined systolic and diastolic CHF, TVR AND MVR in May 2022, A Fib s/p maze procedure in 2022, h/o DVT on Eliquis, COPD, asthma was recently hospitalized at [facility] 10/19-10/24 for RSV bronchitis and superimposed bacterial infection, treated with IV Rocephin and discharged home on oral antibiotics which she completed the course as recommended. After discharge, patient continued to have persistent cough with blood-tinged yellow/green sputum, chest congestion, shortness of breath, subjective fever up to 101 also associated with chest pain, generalized weakness, fatigue 10/31, patient presented to ED with complaint of fever, cough. In the ED, oxygen saturation was in mid 80s with slightest exertion. COVID PCR negative WBC count slightly elevated 11.6 CXR showed no focal infiltrates.  She was started on cefepime  Admitted to [facility] for further evaluation and management. 11/1, CT chest showed mild bilateral patchy airspace opacities are noted most consistent with multifocal pneumonia.  Subjective: Patient was seen and examined this morning.  Sitting up at the edge of the bed.  Not in distress.  She states she continues to feel short of breath like on admission but she is not requiring supplemental oxygen.  She is actually able to ambulate on the hallway later in the morning without supplemental oxygen. Chart reviewed.  No fever, blood pressure in low normal range Last set of blood work from 11/1 with WBC count normal, hemoglobin 9.7  Assessment and plan: Healthcare assisted pneumonia Recently hospitalized at outside hospital for RSV bronchitis, superimposed bacterial infection and treated with  antibiotics Presented with worsening symptoms 11/1, CT chest showed mild bilateral patchy airspace opacities are noted most consistent with multifocal pneumonia. Currently on empiric antibiotic coverage with IV cefepime Continue Tessalon Perles Recent Labs  Lab 06/20/22 1228 06/21/22 1100 06/22/22 1252  WBC 5.7 6.1 6.9     Acute respiratory failure with hypoxia In the ED, O2 sat was reportedly in 80s requiring supplemental oxygen.  Currently not requiring oxygen at rest or on ambulation.  Patient however states that she continues to feel short of breath.  COPD/OSA Continue bronchodilators.  Has an upcoming appointment for sleep study  ESRD-HD-TTS Nephrology consulted. Left arm AV fistula site had an area of swelling and bruising. Ultrasound duplex negative for thrombosis.  Type 2 diabetes mellitus A1c 6.7 in April 3888 PTA on Trulicity 1.5 mg subcu every Wednesday, Premeal sliding scale insulin Currently on sliding scale insulin with Accu-Cheks. Lyrica 3 times daily as needed for neuropathy Recent Labs  Lab 06/21/22 0819  GLUCAP 92   Chronic systolic and diastolic congestive heart failure followed by HF team H/o TVR AND MVR in May 2022 remains euvolemic on exam PTA on Toprol 25 mg daily, Imdur 15 mg daily, torsemide 100 mg MWF, metolazone 2.5 mg twice a week Follows up with Dr. Jeffie Pollock as an outpatient Continue Toprol, Imdur.  Diuretics on hold today because of soft blood pressure.  Continue to monitor.   A Fib s/p maze procedure in 2022 Continue amiodarone 200 mg twice daily, Eliquis 5 mg twice daily Continue to monitor in telemetry  HLD Continue Crestor  Anxiety/depression Continue buspirone 5 mg twice daily, Cymbalta 60 mg daily,  Chronic constipation Scheduled Senokot, as needed Linzess, MiraLAX  Chronic  anemia Hemoglobin stable at baseline close to 10. Continue PPI Recent Labs    11/22/21 1226 11/23/21 0416 02/17/22 0252 02/20/22 0727  03/13/22 6962 03/14/22 0213 05/18/22 0527 06/20/22 1228 06/20/22 1254 06/21/22 1100 06/22/22 1252  HGB  --    < > 8.4*   < > 9.5*   < > 11.3* 10.3* 11.6* 9.7* 9.1*  MCV  --    < > 83.1   < > 81.9   < > 98.7 94.4  --  94.4 96.3  VITAMINB12 645  --   --   --   --   --   --   --   --   --   --   FOLATE 9.9  --   --   --   --   --   --   --   --   --   --   FERRITIN 42  --  38  --  118  --   --   --   --   --   --   TIBC 316  --  382  --  231*  --   --   --   --   --   --   IRON 38  --  24*  --  63  --   --   --   --   --   --   RETICCTPCT 3.3*  --   --   --   --   --   --   --   --   --   --    < > = values in this interval not displayed.    Goals of care   Code Status: Full Code    Mobility: Encourage ambulation  Skin assessment:     Nutritional status:  Body mass index is 44.89 kg/m.          Diet:  Diet Order             Diet renal/carb modified with fluid restriction Diet-HS Snack? Nothing; Fluid restriction: 1200 mL Fluid; Room service appropriate? Yes; Fluid consistency: Thin  Diet effective now                   DVT prophylaxis:  SCDs Start: 06/21/22 1143 apixaban (ELIQUIS) tablet 5 mg   Antimicrobials: IV cefepime Fluid: None Consultants: None Family Communication: None at bedside  Status is: Inpatient  Continue in-hospital care because: Continue monitor breathing status. Level of care: Telemetry Medical   Dispo: The patient is from: Home              Anticipated d/c is to: Hopefully home tomorrow if continues to feel better.              Patient currently is not medically stable to d/c.   Difficult to place patient No     Infusions:   anticoagulant sodium citrate     ceFEPime (MAXIPIME) IV Stopped (06/21/22 1742)   vancomycin      Scheduled Meds:  amiodarone  200 mg Oral BID   apixaban  5 mg Oral BID   busPIRone  5 mg Oral BID   Chlorhexidine Gluconate Cloth  6 each Topical Daily   colchicine  0.6 mg Oral Daily   doxercalciferol   1 mcg Intravenous Q T,Th,Sa-HD   DULoxetine  60 mg Oral Daily   guaiFENesin  600 mg Oral BID   heparin  2,500 Units Dialysis Once in dialysis  isosorbide mononitrate  15 mg Oral Daily   metoprolol succinate  25 mg Oral Daily   multivitamin with minerals  1 tablet Oral Daily   senna-docusate  1 tablet Oral BID    PRN meds: acetaminophen **OR** acetaminophen, alteplase, anticoagulant sodium citrate, benzonatate, diphenhydrAMINE, guaiFENesin, heparin, ipratropium-albuterol, lidocaine (PF), lidocaine-prilocaine, nitroGLYCERIN, ondansetron **OR** ondansetron (ZOFRAN) IV, pentafluoroprop-tetrafluoroeth, polyethylene glycol powder, pregabalin   Antimicrobials: Anti-infectives (From admission, onward)    Start     Dose/Rate Route Frequency Ordered Stop   06/22/22 1200  vancomycin (VANCOCIN) IVPB 1000 mg/200 mL premix        1,000 mg 200 mL/hr over 60 Minutes Intravenous Every T-Th-Sa (Hemodialysis) 06/21/22 1734     06/21/22 0809  vancomycin variable dose per unstable renal function (pharmacist dosing)  Status:  Discontinued         Does not apply See admin instructions 06/21/22 0809 06/21/22 1735   06/20/22 1600  vancomycin (VANCOCIN) 2,500 mg in sodium chloride 0.9 % 500 mL IVPB        2,500 mg 262.5 mL/hr over 120 Minutes Intravenous  Once 06/20/22 1546 06/20/22 2030   06/20/22 1600  ceFEPIme (MAXIPIME) 1 g in sodium chloride 0.9 % 100 mL IVPB        1 g 200 mL/hr over 30 Minutes Intravenous Every 24 hours 06/20/22 1546         Objective: Vitals:   06/22/22 1400 06/22/22 1430  BP: 112/63 (!) 111/57  Pulse: 81 85  Resp: 17 (!) 31  Temp:    SpO2: 96% 97%    Intake/Output Summary (Last 24 hours) at 06/22/2022 1437 Last data filed at 06/22/2022 1200 Gross per 24 hour  Intake 700 ml  Output --  Net 700 ml   Filed Weights   06/20/22 1150  Weight: 130 kg   Weight change:  Body mass index is 44.89 kg/m.   Physical Exam: General exam: Elderly African-American female.  Not  in pain Skin: No rashes, lesions or ulcers. HEENT: Atraumatic, normocephalic, no obvious bleeding Lungs: Mild scattered Rales bilaterally, CVS: Regular rate and rhythm, no murmur GI/Abd soft, nontender, nondistended, bowel sound present CNS: Alert, awake, oriented x3 Psychiatry: Mood appropriate Extremities: Mild baseline bilateral pedal edema, no calf tenderness  Data Review: I have personally reviewed the laboratory data and studies available.  F/u labs ordered Unresulted Labs (From admission, onward)     Start     Ordered   06/21/22 1654  Hepatitis B surface antibody,quantitative  (New Admission Hemo Labs (Hepatitis B))  Once,   R        06/21/22 1656   Pending  Hepatitis B surface antibody,quantitative  (New Admission Hemo Labs (Hepatitis B))  ONCE - URGENT,   R        Pending   Pending  Hepatitis B surface antigen  (New Admission Hemo Labs (Hepatitis B))  ONCE - URGENT,   R        Pending            Signed, Terrilee Croak, MD [facility] 06/22/2022

## 2022-06-22 NOTE — Telephone Encounter (Signed)
Informed [name] pharmacist that per last office visit with Dr. Harrell Gave on 05/01/22, pt was taking torsemide 100 mg every Monday, Wednesday, and Friday.   Pharmacist verbalized understanding.

## 2022-06-22 NOTE — Progress Notes (Addendum)
Seen in hemodialysis. Noticed to have blood tinged secretions with cough. Patient says she has BT sputum since has had since admission. Reviewed CT of chest an CXR with Dr. Jonnie Finner. Although volume status outwardly appears acceptable, concerned for pulmonary edema. Increase UFG to 2.5 liters. Resumed Torsemide which has been on hold since admission.Torsemide was MWFS at OP HD clinic but apparently dose has been changed per cardiology. Will resume tomorrow. Monitor volume status carefully.  [name] [facility] [phone]

## 2022-06-22 NOTE — Progress Notes (Signed)
Refused cpap.

## 2022-06-23 DIAGNOSIS — J189 Pneumonia, unspecified organism: Secondary | ICD-10-CM | POA: Diagnosis not present

## 2022-06-23 LAB — GLUCOSE, CAPILLARY
Glucose-Capillary: 114 mg/dL — ABNORMAL HIGH (ref 70–99)
Glucose-Capillary: 87 mg/dL (ref 70–99)
Glucose-Capillary: 113 mg/dL — ABNORMAL HIGH (ref 70–99)

## 2022-06-23 LAB — HEPATITIS B SURFACE ANTIBODY, QUANTITATIVE: Hep B S AB Quant (Post): 21.8 m[IU]/mL (ref >9.9)

## 2022-06-23 MED ORDER — INSULIN ASPART 100 UNIT/ML IJ SOLN
0.0000 [IU] | Freq: Three times a day (TID) | INTRAMUSCULAR | Status: DC
  Administered 2022-06-24: 1 [IU] via SUBCUTANEOUS

## 2022-06-23 MED ORDER — INSULIN ASPART 100 UNIT/ML IJ SOLN: 0.0000 [IU] | Freq: Every day | INTRAMUSCULAR | Status: DC

## 2022-06-23 NOTE — Plan of Care (Signed)

## 2022-06-23 NOTE — Progress Notes (Addendum)
[employer] for  Home dose of Torsemide  Prior to admit Torsemide dosing was reported during PTA medication review as 100 mg every MWF per external pharmacy./reconcile outside information. . I interviewed patient for her prior to admission torsemide dose on 06/21/22. She was uncertain of dose but thought dose had been reduced.  Contacted [facility] pharmacy who confirmed most recent prescription on 06/13/22 for Torsemide '20mg'$  tablets, to take 4 tabs (80 mg)  daily x 30 days, and may take extra '20mg'$  daily for weight gain.  Patient reported that this prescription was from a physician at  recent visiti to [facility] faciltity and that dose was not correct.  She reports follows up w/Dr. Jeffie Pollock & Dr Harrell Gave .  Contacted Dr. Judeth Cornfield nurse on 11/1 who later confirmed that Torsemide dose 100 mg po MWF  was correct.     -Note that Nephology PA noted 11/2  noted Torsemide was MWFS at OP HD clinic but apparently dose has been changed per cardiology.   Pharmacist will close consult /sign off.    Thank you for allowing pharmacy to be part of this patients care team.  Nicole Cella, RPh Clinical Pharmacist 06/23/2022 8:42 AM

## 2022-06-23 NOTE — Progress Notes (Signed)
PROGRESS NOTE  [name]  DOB: May 05, 1956  PCP: [name], NP MBW:[id number]  DOA: 06/20/2022  LOS: 2 days  Hospital Day: 4  Brief narrative: [name] is a 66 y.o. female with PMH significant for ESRD-HD-TTS since 01/2022, morbid obesity, OSA on CPAP, DM 2, HTN, HLD, chronic combined systolic and diastolic CHF, TVR AND MVR in May 2022, A Fib s/p maze procedure in 2022, h/o DVT on Eliquis, COPD, asthma was recently hospitalized at [facility] 10/19-10/24 for RSV bronchitis and superimposed bacterial infection, treated with IV Rocephin and discharged home on oral antibiotics which she completed the course as recommended. After discharge, patient continued to have persistent cough with blood-tinged yellow/green sputum, chest congestion, shortness of breath, subjective fever up to 101 also associated with chest pain, generalized weakness, fatigue 10/31, patient presented to ED with complaint of fever, cough. In the ED, oxygen saturation was in mid 80s with slightest exertion. COVID PCR negative WBC count slightly elevated 11.6 CXR showed no focal infiltrates.  She was started on cefepime  Admitted to [facility] for further evaluation and management. 11/1, CT chest showed mild bilateral patchy airspace opacities are noted most consistent with multifocal pneumonia.  Subjective: Patient was seen and examined this morning. Continues to be short of breath which seems mostly subjective.  Feels not able to cough up phlegm.  Currently both Tessalon Perles and Mucinex.  Complains of intermittent streaks of blood on cough. Does not feel comfortable with discharge  Assessment and plan: Healthcare assisted pneumonia Recently hospitalized at outside hospital for RSV bronchitis, superimposed bacterial infection and treated with antibiotics Presented with worsening symptoms 11/1, CT chest showed mild bilateral patchy airspace opacities are noted most consistent with multifocal  pneumonia. Currently on empiric antibiotic coverage with IV cefepime and IV vancomycin Continue Tessalon Perles, Mucinex No fever.  WBC count normal. Recent Labs  Lab 06/20/22 1228 06/21/22 1100 06/22/22 1252  WBC 5.7 6.1 6.9    Acute respiratory failure with hypoxia In the ED, O2 sat was reportedly in 80s requiring supplemental oxygen.  Currently not requiring oxygen at rest or on ambulation.  Patient however states that she continues to feel short of breath.  Hemoptysis Having intermittent streaks of blood in cough.  Probably due to bronchitis chest.  CT chest on admission not concerning for any malignancy.  No recent history of travel or exposure to patients with fibrosis.   Hemoglobin stable.  Continue to monitor symptoms.  COPD/OSA Continue bronchodilators.  Has an upcoming appointment for sleep study  ESRD-HD-TTS Nephrology consulted. Left arm AV fistula site had an area of swelling and bruising. Ultrasound duplex negative for thrombosis.  Type 2 diabetes mellitus A1c 6.7 in April 0177 PTA on Trulicity 1.5 mg subcu every Wednesday, Premeal sliding scale insulin Currently on sliding scale insulin with Accu-Cheks. Lyrica 3 times daily as needed for neuropathy Recent Labs  Lab 06/21/22 0819 06/23/22 1301  GLUCAP 92 114*   Chronic systolic and diastolic congestive heart failure followed by HF team H/o TVR AND MVR in May 2022 remains euvolemic on exam PTA on Toprol 25 mg daily, Imdur 15 mg daily, torsemide 100 mg MWF, metolazone 2.5 mg twice a week Continue Toprol and torsemide Follows up with Dr. Jeffie Pollock as an outpatient   A Fib s/p maze procedure in 2022 Continue amiodarone 200 mg twice daily, Eliquis 5 mg twice daily Continue to monitor in telemetry  HLD Continue Crestor  Anxiety/depression Continue buspirone 5 mg twice daily, Cymbalta 60 mg daily,  Chronic constipation Scheduled Senokot, as needed Linzess, MiraLAX  Chronic anemia Hemoglobin stable at  baseline close to 10. Continue PPI Recent Labs    11/22/21 1226 11/23/21 0416 02/17/22 0252 02/20/22 0727 03/13/22 2836 03/14/22 0213 05/18/22 0527 06/20/22 1228 06/20/22 1254 06/21/22 1100 06/22/22 1252  HGB  --    < > 8.4*   < > 9.5*   < > 11.3* 10.3* 11.6* 9.7* 9.1*  MCV  --    < > 83.1   < > 81.9   < > 98.7 94.4  --  94.4 96.3  VITAMINB12 645  --   --   --   --   --   --   --   --   --   --   FOLATE 9.9  --   --   --   --   --   --   --   --   --   --   FERRITIN 42  --  38  --  118  --   --   --   --   --   --   TIBC 316  --  382  --  231*  --   --   --   --   --   --   IRON 38  --  24*  --  63  --   --   --   --   --   --   RETICCTPCT 3.3*  --   --   --   --   --   --   --   --   --   --    < > = values in this interval not displayed.    Goals of care   Code Status: Full Code    Mobility: Encourage ambulation  Skin assessment:     Nutritional status:  Body mass index is 44.99 kg/m.          Diet:  Diet Order             Diet renal/carb modified with fluid restriction Diet-HS Snack? Nothing; Fluid restriction: 1200 mL Fluid; Room service appropriate? Yes; Fluid consistency: Thin  Diet effective now                   DVT prophylaxis:  SCDs Start: 06/21/22 1143 apixaban (ELIQUIS) tablet 5 mg   Antimicrobials: IV cefepime and IV vancomycin Fluid: None Consultants: None Family Communication: None at bedside  Status is: Inpatient  Continue in-hospital care because: Continue monitor breathing status, hemodialysis Level of care: Telemetry Medical   Dispo: The patient is from: Home              Anticipated d/c is to: Hopefully home tomorrow if feels better.              Patient currently is not medically stable to d/c.   Difficult to place patient No     Infusions:   ceFEPime (MAXIPIME) IV Stopped (06/23/22 0200)   vancomycin Stopped (06/23/22 0200)    Scheduled Meds:  amiodarone  200 mg Oral BID   apixaban  5 mg Oral BID   busPIRone   5 mg Oral BID   Chlorhexidine Gluconate Cloth  6 each Topical Daily   colchicine  0.6 mg Oral Daily   doxercalciferol  1 mcg Intravenous Q T,Th,Sa-HD   DULoxetine  60 mg Oral Daily   guaiFENesin  600 mg Oral BID   insulin aspart  0-5 Units Subcutaneous QHS   insulin aspart  0-9 Units Subcutaneous TID WC   isosorbide mononitrate  15 mg Oral Daily   metoprolol succinate  25 mg Oral Daily   multivitamin with minerals  1 tablet Oral Daily   senna-docusate  1 tablet Oral BID   torsemide  100 mg Oral Once per day on Mon Wed Fri    PRN meds: acetaminophen **OR** acetaminophen, benzonatate, diphenhydrAMINE, guaiFENesin, HYDROmorphone (DILAUDID) injection, ipratropium-albuterol, nitroGLYCERIN, ondansetron **OR** ondansetron (ZOFRAN) IV, oxyCODONE, polyethylene glycol powder, pregabalin   Antimicrobials: Anti-infectives (From admission, onward)    Start     Dose/Rate Route Frequency Ordered Stop   06/22/22 1200  vancomycin (VANCOCIN) IVPB 1000 mg/200 mL premix        1,000 mg 200 mL/hr over 60 Minutes Intravenous Every T-Th-Sa (Hemodialysis) 06/21/22 1734     06/21/22 0809  vancomycin variable dose per unstable renal function (pharmacist dosing)  Status:  Discontinued         Does not apply See admin instructions 06/21/22 0809 06/21/22 1735   06/20/22 1600  vancomycin (VANCOCIN) 2,500 mg in sodium chloride 0.9 % 500 mL IVPB        2,500 mg 262.5 mL/hr over 120 Minutes Intravenous  Once 06/20/22 1546 06/20/22 2030   06/20/22 1600  ceFEPIme (MAXIPIME) 1 g in sodium chloride 0.9 % 100 mL IVPB        1 g 200 mL/hr over 30 Minutes Intravenous Every 24 hours 06/20/22 1546         Objective: Vitals:   06/23/22 0844 06/23/22 1303  BP: 109/81 116/76  Pulse: 76 81  Resp: 13 16  Temp: 97.9 F (36.6 C) 98 F (36.7 C)  SpO2: 98% 97%    Intake/Output Summary (Last 24 hours) at 06/23/2022 1557 Last data filed at 06/23/2022 0200 Gross per 24 hour  Intake 300 ml  Output 2600 ml  Net -2300 ml    Filed Weights   06/20/22 1150 06/22/22 1245 06/22/22 1711  Weight: 130 kg 132.4 kg 130.3 kg   Weight change:  Body mass index is 44.99 kg/m.   Physical Exam: General exam: Elderly African-American female.  Not in pain Skin: No rashes, lesions or ulcers. HEENT: Atraumatic, normocephalic, no obvious bleeding Lungs: Clear to auscultation bilaterally CVS: Regular rate and rhythm, no murmur GI/Abd soft, nontender, nondistended, bowel sound present CNS: Alert, awake, oriented x3 Psychiatry: Mood appropriate Extremities: Mild baseline bilateral pedal edema, no calf tenderness  Data Review: I have personally reviewed the laboratory data and studies available.  F/u labs ordered [employer] (From admission, onward)     Start     Ordered   Signed and Held  Renal function panel  Tomorrow morning,   R        Signed and Held   Signed and Held  CBC  Tomorrow morning,   R        Signed and Held            Signed, Terrilee Croak, MD [facility] 06/23/2022

## 2022-06-23 NOTE — Progress Notes (Signed)
[facility] Progress Note   Subjective:  Seen in room - still coughing, some blood tinged sputum this AM per her report. No CP or dyspnea at the moment. Dialyzed yesterday - 2.6L net UF.  Objective Vitals:   06/22/22 2038 06/22/22 2327 06/23/22 0431 06/23/22 0844  BP: 95/60 102/64 115/69 109/81  Pulse: 77 83 79 76  Resp: '20 18 17 13  '$ Temp: 98.4 F (36.9 C) 97.6 F (36.4 C) 97.7 F (36.5 C) 97.9 F (36.6 C)  TempSrc: Oral Oral Oral Oral  SpO2: 95% 96%  98%  Weight:      Height:       Physical Exam General: Well appearing woman, NAD. Room air. Heart: RRR; no murmur Lungs: CTAB; no rales or wheezing Abdomen: soft Extremities: No LE edema Dialysis Access: LUE AVF with very large hematoma present, still with bruit throughout although somewhat quiet. R [facility]  Additional Objective Labs: Basic Metabolic Panel: Recent Labs  Lab 06/20/22 1228 06/20/22 1254 06/21/22 1100 06/22/22 1252  NA 136 138 139 137  K 3.3* 3.4* 3.9 3.8  CL 94*  --  96* 96*  CO2 34*  --  29 29  GLUCOSE 125*  --  91 108*  BUN 27*  --  37* 53*  CREATININE 5.38*  --  7.19* 8.99*  CALCIUM 9.4  --  9.5 9.6  PHOS  --   --   --  5.9*   Liver Function Tests: Recent Labs  Lab 06/20/22 1228 06/22/22 1252  AST 21  --   ALT 20  --   ALKPHOS 105  --   BILITOT 0.6  --   PROT 8.8*  --   ALBUMIN 4.0 3.2*   CBC: Recent Labs  Lab 06/20/22 1228 06/20/22 1254 06/21/22 1100 06/22/22 1252  WBC 5.7  --  6.1 6.9  NEUTROABS 3.2  --  3.3  --   HGB 10.3* 11.6* 9.7* 9.1*  HCT 32.1* 34.0* 30.3* 28.9*  MCV 94.4  --  94.4 96.3  PLT 182  --  169 172   Studies/Results: VAS [place] UPPER EXTREMITY VENOUS DUPLEX  Result Date: 06/21/2022 UPPER VENOUS STUDY  Patient Name:  [name]  Date of Exam:   06/21/2022 Medical Rec #: [id number]       Accession #:    [id number] Date of Birth: [birth year]       Patient Gender: F Patient Age:   66 years Exam Location:  [facility] Procedure:      VAS [place] UPPER  EXTREMITY VENOUS DUPLEX Referring Phys: NA LI --------------------------------------------------------------------------------  Indications: Pain and swelling of left upper arm, site of brachio-cephalic AVF Other Indications: History of DVT lower extremity. Risk Factors: Surgery 03-17-2022 Left brachio-cephalic AVF creation. Anticoagulation: Eliquis. Comparison Study: 05-04-2022 Prior duplex dialysis access showed patent AVF Performing Technologist: Darlin Coco RDMS, RVT  Examination Guidelines: A complete evaluation includes B-mode imaging, spectral Doppler, color Doppler, and power Doppler as needed of all accessible portions of each vessel. Bilateral testing is considered an integral part of a complete examination. Limited examinations for reoccurring indications may be performed as noted.  Right Findings: +----------+------------+---------+-----------+----------+-------+ RIGHT     CompressiblePhasicitySpontaneousPropertiesSummary +----------+------------+---------+-----------+----------+-------+ Subclavian               Yes       Yes                      +----------+------------+---------+-----------+----------+-------+  Left Findings: +----------+------------+---------+-----------+----------+----------+ LEFT  CompressiblePhasicitySpontaneousProperties Summary   +----------+------------+---------+-----------+----------+----------+ IJV                      Yes       Yes                         +----------+------------+---------+-----------+----------+----------+ Subclavian               Yes       Yes                         +----------+------------+---------+-----------+----------+----------+ Axillary      Full       Yes       Yes                         +----------+------------+---------+-----------+----------+----------+ Brachial      Full                                             +----------+------------+---------+-----------+----------+----------+  Radial        Full                                             +----------+------------+---------+-----------+----------+----------+ Ulnar         Full                                             +----------+------------+---------+-----------+----------+----------+ Cephalic                                            Patent AVF +----------+------------+---------+-----------+----------+----------+ Basilic       Full                                             +----------+------------+---------+-----------+----------+----------+  Summary:  Right: No evidence of thrombosis in the subclavian.  Left: No evidence of deep vein thrombosis in the upper extremity. No evidence of superficial vein thrombosis in the upper extremity.  Incidental: Avascular, heterogenous focal collection noted at the mid upper arm, deep to the cephalic AVF. Measures 2.2 cm and appears to extend >6cm from mid to distal upper arm.  *See table(s) above for measurements and observations.  Diagnosing physician: Monica Martinez MD Electronically signed by Monica Martinez MD on 06/21/2022 at 1:40:27 PM.    Final    CT CHEST WO CONTRAST  Result Date: 06/21/2022 CLINICAL DATA:  Shortness of breath, chest pain. EXAM: CT CHEST WITHOUT CONTRAST TECHNIQUE: Multidetector CT imaging of the chest was performed following the standard protocol without IV contrast. RADIATION DOSE REDUCTION: This exam was performed according to the departmental dose-optimization program which includes automated exposure control, adjustment of the mA and/or kV according to patient size and/or use of iterative reconstruction technique. COMPARISON:  Radiograph same day.  CT scan of March 08, 2022. FINDINGS: Cardiovascular: Atherosclerosis of thoracic  aorta is noted without aneurysm formation. Mild cardiomegaly is noted. Status post mitral and tricuspid valve repair. No pericardial effusion is noted. Right internal jugular catheter is noted with tip in  right atrium. Mediastinum/Nodes: No enlarged mediastinal or axillary lymph nodes. Thyroid gland, trachea, and esophagus demonstrate no significant findings. Lungs/Pleura: No pneumothorax or pleural effusion is noted. Bilateral patchy airspace opacities are noted most consistent with multifocal inflammation. Upper Abdomen: No acute abnormality. Musculoskeletal: No chest wall mass or suspicious bone lesions identified. IMPRESSION: Mild bilateral patchy airspace opacities are noted most consistent with multifocal pneumonia. Aortic Atherosclerosis (ICD10-I70.0). Electronically Signed   By: Marijo Conception M.D.   On: 06/21/2022 13:13    Medications:  ceFEPime (MAXIPIME) IV Stopped (06/23/22 0200)   vancomycin Stopped (06/23/22 0200)    amiodarone  200 mg Oral BID   apixaban  5 mg Oral BID   busPIRone  5 mg Oral BID   Chlorhexidine Gluconate Cloth  6 each Topical Daily   colchicine  0.6 mg Oral Daily   doxercalciferol  1 mcg Intravenous Q T,Th,Sa-HD   DULoxetine  60 mg Oral Daily   guaiFENesin  600 mg Oral BID   insulin aspart  0-5 Units Subcutaneous QHS   insulin aspart  0-9 Units Subcutaneous TID WC   isosorbide mononitrate  15 mg Oral Daily   metoprolol succinate  25 mg Oral Daily   multivitamin with minerals  1 tablet Oral Daily   senna-docusate  1 tablet Oral BID   torsemide  100 mg Oral Once per day on Mon Wed Fri    Dialysis Orders: [place] T,Th,S 4 hrs 180NRe 400/600 130 kgs 3.0 K/2.5 Ca UFP 2 L AVF/RIJ TDC - Heparin 2500 units IV TIW - Hectorol 1 mcg IV TIW - Mircera 50 mcg IV q 2 weeks (last dose 06/20/2022 Next dose due 07/03/2022)  Assessment/Plan: Acute hypoxic respiratory failure/HCAP: On Vanc/Cefepime.  ESRD: Continue HD per usual TTS - HD tomorrow. Severe infiltration of AVF. Will use TDC.  Hypertension/volume: Seems ok, CT chest 10/31 c/w multifocal pneumonia. Room air. Anemia of ESRD: Hgb 9.1, not due for ESA yet. Metabolic bone disease: Corr Ca and Phos up slightly,  apparently not on binders as outpatient. Await next level. If still high, will start. Nutrition: Alb low, adding supplements. DMT2-per primary Afib-on Eliquis. Currently in SR Combined systolic/diastolic HF-volume appears fine. Continue to optimize volume on HD. Follow with HF. OSA/COPD-continue CPAP, per primary  Veneta Penton, PA-C 06/23/2022, 11:35 AM  [facility]

## 2022-06-24 DIAGNOSIS — J189 Pneumonia, unspecified organism: Secondary | ICD-10-CM | POA: Diagnosis not present

## 2022-06-24 LAB — CBC
HCT: 29.5 % — ABNORMAL LOW (ref 36.0–46.0)
Hemoglobin: 9.6 g/dL — ABNORMAL LOW (ref 12.0–15.0)
MCH: 30.4 pg (ref 26.0–34.0)
MCHC: 32.5 g/dL (ref 30.0–36.0)
MCV: 93.4 fL (ref 80.0–100.0)
Platelets: 181 10*3/uL (ref 150–400)
RBC: 3.16 MIL/uL — ABNORMAL LOW (ref 3.87–5.11)
RDW: 14.5 % (ref 11.5–15.5)
WBC: 7 10*3/uL (ref 4.0–10.5)
nRBC: 0 % (ref 0.0–0.2)

## 2022-06-24 LAB — RENAL FUNCTION PANEL
Albumin: 3.2 g/dL — ABNORMAL LOW (ref 3.5–5.0)
Anion gap: 18 — ABNORMAL HIGH (ref 5–15)
BUN: 45 mg/dL — ABNORMAL HIGH (ref 8–23)
CO2: 26 mmol/L (ref 22–32)
Calcium: 10.1 mg/dL (ref 8.9–10.3)
Chloride: 92 mmol/L — ABNORMAL LOW (ref 98–111)
Creatinine, Ser: 7.26 mg/dL — ABNORMAL HIGH (ref 0.44–1.00)
GFR, Estimated: 6 mL/min — ABNORMAL LOW (ref >60)
Glucose, Bld: 85 mg/dL (ref 70–99)
Phosphorus: 5.7 mg/dL — ABNORMAL HIGH (ref 2.5–4.6)
Potassium: 4 mmol/L (ref 3.5–5.1)
Sodium: 136 mmol/L (ref 135–145)

## 2022-06-24 LAB — GLUCOSE, CAPILLARY
Glucose-Capillary: 84 mg/dL (ref 70–99)
Glucose-Capillary: 121 mg/dL — ABNORMAL HIGH (ref 70–99)
Glucose-Capillary: 98 mg/dL (ref 70–99)

## 2022-06-24 LAB — PROCALCITONIN: Procalcitonin: 0.41 ng/mL

## 2022-06-24 LAB — SEDIMENTATION RATE: Sed Rate: 100 mm/hr — ABNORMAL HIGH (ref 0–22)

## 2022-06-24 MED ORDER — PROCHLORPERAZINE EDISYLATE 10 MG/2ML IJ SOLN
5.0000 mg | Freq: Four times a day (QID) | INTRAMUSCULAR | Status: DC | PRN
  Administered 2022-06-24 – 2022-06-25 (×2): 5 mg via INTRAVENOUS
  Filled 2022-06-24 (×2): qty 2

## 2022-06-24 MED ORDER — HEPARIN SODIUM (PORCINE) 1000 UNIT/ML IJ SOLN
INTRAMUSCULAR | Status: AC
  Filled 2022-06-24: qty 3

## 2022-06-24 MED ORDER — ANTICOAGULANT SODIUM CITRATE 4% (200MG/5ML) IV SOLN: 5.0000 mL | Status: DC | PRN

## 2022-06-24 MED ORDER — HEPARIN SODIUM (PORCINE) 1000 UNIT/ML DIALYSIS: 1000.0000 [IU] | INTRAMUSCULAR | Status: DC | PRN

## 2022-06-24 MED ORDER — ALTEPLASE 2 MG IJ SOLR: 2.0000 mg | Freq: Once | INTRAMUSCULAR | Status: DC | PRN

## 2022-06-24 NOTE — Progress Notes (Signed)
PROGRESS NOTE  [name]  DOB: 02/29/1956  PCP: [name], NP NWG:[id number]  DOA: 06/20/2022  LOS: 3 days  Hospital Day: 5  Brief narrative: [name] is a 66 y.o. female with PMH significant for ESRD-HD-TTS since 01/2022, morbid obesity, OSA on CPAP, DM 2, HTN, HLD, chronic combined systolic and diastolic CHF, TVR AND MVR in May 2022, A Fib s/p maze procedure in 2022, h/o DVT on Eliquis, COPD, asthma was recently hospitalized at [facility] 10/19-10/24 for RSV bronchitis and superimposed bacterial infection, treated with IV Rocephin and discharged home on oral antibiotics which she completed the course as recommended. After discharge, patient continued to have persistent cough with blood-tinged yellow/green sputum, chest congestion, shortness of breath, subjective fever up to 101 also associated with chest pain, generalized weakness, fatigue 10/31, patient presented to ED with complaint of fever, cough. In the ED, oxygen saturation was in mid 80s with slightest exertion. COVID PCR negative WBC count slightly elevated 11.6 CXR showed no focal infiltrates.  She was started on cefepime  Admitted to [facility] for further evaluation and management. 11/1, CT chest showed mild bilateral patchy airspace opacities are noted most consistent with multifocal pneumonia.  Subjective:   Patient in bed, appears comfortable, denies any headache, no fever, no chest pain or pressure, no shortness of breath , no abdominal pain. No new focal weakness.  Assessment and plan:  Pneumonia with acute hypoxic respiratory failure - Recently hospitalized at outside hospital for RSV bronchitis, superimposed bacterial infection and treated with antibiotics, presented with worsening symptoms, 11/1, CT chest showed mild bilateral patchy airspace opacities are noted most consistent with multifocal pneumonia.  Currently on IV vancomycin and cefepime, cultures negative thus far, clinically better, transition to room  air and advance activity.  Add I-S and flutter valve and encouraged to sit up in chair for pulmonary toiletry.  If stable likely discharge tomorrow morning  Hemoptysis - had some blood streaked phlegm likely due to bronchial irritation from cough, CT stable.  Much improved.  Monitor.  COPD/OSA - Continue bronchodilators.  Has an upcoming appointment for sleep study  ESRD-HD-TTS - Nephrology consulted. Left arm AV fistula site had an area of swelling and bruising. Ultrasound duplex negative for thrombosis.  Also has right IJ dialysis catheter.  Chronic combined systolic and diastolic congestive heart failure followed by HF team, H/o TVR AND MVR in May 2022 remains euvolemic on exam, fluid removal via HD.  Continue home combination of Toprol and torsemide, resume home dose Imdur.  Follows with Dr. Jeffie Pollock as an outpatient.   History of chronic A Fib s/p maze procedure in 2022 - Continue amiodarone 200 mg twice daily, Eliquis 5 mg twice daily, stable on telemetry transfer to [place].  HLD - Continue Crestor  Anxiety/depression  -  Continue buspirone 5 mg twice daily, Cymbalta 60 mg daily,  Chronic constipation - Scheduled Senokot, as needed Linzess, MiraLAX  Chronic anemia  - Hemoglobin stable at baseline close to 10. Continue PPI.   Type 2 diabetes mellitus with diabetic peripheral neuropathy - A1c 6.7 in April 5784, PTA on Trulicity 1.5 mg subcu every Wednesday, Premeal sliding scale insulin, Currently on sliding scale insulin with Accu-Cheks. Lyrica 3 times daily as needed for neuropathy  CBG (last 3)  Recent Labs    06/23/22 1301 06/23/22 1707 06/23/22 2119  GLUCAP 114* 87 113*     Goals of care   Code Status: Full Code    Mobility: Encourage ambulation  Skin assessment:  Diet:  Diet Order             Diet renal/carb modified with fluid restriction Diet-HS Snack? Nothing; Fluid restriction: 1200 mL Fluid; Room service appropriate? Yes; Fluid consistency: Thin   Diet effective now                   DVT prophylaxis:  SCDs Start: 06/21/22 1143 apixaban (ELIQUIS) tablet 5 mg   Antimicrobials: IV cefepime and IV vancomycin Fluid: None Consultants: None Family Communication: None at bedside  Status is: Inpatient  Continue in-hospital care because: Continue monitor breathing status, hemodialysis Level of care: Telemetry Medical   Dispo: The patient is from: Home              Anticipated d/c is to: Hopefully home tomorrow if feels better.              Patient currently is not medically stable to d/c.   Difficult to place patient No    Scheduled Meds:  amiodarone  200 mg Oral BID   apixaban  5 mg Oral BID   busPIRone  5 mg Oral BID   Chlorhexidine Gluconate Cloth  6 each Topical Daily   colchicine  0.6 mg Oral Daily   doxercalciferol  1 mcg Intravenous Q T,Th,Sa-HD   DULoxetine  60 mg Oral Daily   guaiFENesin  600 mg Oral BID   insulin aspart  0-5 Units Subcutaneous QHS   insulin aspart  0-9 Units Subcutaneous TID WC   isosorbide mononitrate  15 mg Oral Daily   metoprolol succinate  25 mg Oral Daily   multivitamin with minerals  1 tablet Oral Daily   senna-docusate  1 tablet Oral BID   torsemide  100 mg Oral Once per day on Mon Wed Fri    PRN meds: acetaminophen **OR** acetaminophen, benzonatate, diphenhydrAMINE, guaiFENesin, ipratropium-albuterol, nitroGLYCERIN, ondansetron **OR** ondansetron (ZOFRAN) IV, oxyCODONE, polyethylene glycol powder, pregabalin    Objective: Vitals:   06/24/22 0800 06/24/22 0830  BP: 122/75 117/65  Pulse: 78 79  Resp: 18 16  Temp:    SpO2:  99%    Intake/Output Summary (Last 24 hours) at 06/24/2022 0841 Last data filed at 06/24/2022 0657 Gross per 24 hour  Intake 340 ml  Output --  Net 340 ml   Filed Weights   06/22/22 1245 06/22/22 1711 06/24/22 0730  Weight: 132.4 kg 130.3 kg 130.7 kg   Weight change:  Body mass index is 45.13 kg/m.   Physical Exam:  Awake Alert, No new F.N  deficits, right chest wall HD catheter [place].AT,PERRAL Supple Neck, No JVD,   Symmetrical Chest wall movement, Good air movement bilaterally, CTAB RRR,No Gallops, Rubs or new Murmurs,  +ve B.Sounds, Abd Soft, No tenderness,   No Cyanosis, Clubbing or edema   Data Review: I have personally reviewed the laboratory data and studies available.  Recent Labs  Lab 06/20/22 1228 06/20/22 1254 06/21/22 1100 06/22/22 1252 06/24/22 0329  WBC 5.7  --  6.1 6.9 7.0  HGB 10.3* 11.6* 9.7* 9.1* 9.6*  HCT 32.1* 34.0* 30.3* 28.9* 29.5*  PLT 182  --  169 172 181  MCV 94.4  --  94.4 96.3 93.4  MCH 30.3  --  30.2 30.3 30.4  MCHC 32.1  --  32.0 31.5 32.5  RDW 14.8  --  14.8 14.8 14.5  LYMPHSABS 1.2  --  1.4  --   --   MONOABS 1.0  --  1.2*  --   --  EOSABS 0.2  --  0.2  --   --   BASOSABS 0.0  --  0.0  --   --     Recent Labs  Lab 06/20/22 1228 06/20/22 1254 06/21/22 1100 06/22/22 1252 06/24/22 0329  NA 136 138 139 137  --   K 3.3* 3.4* 3.9 3.8  --   CL 94*  --  96* 96*  --   CO2 34*  --  29 29  --   GLUCOSE 125*  --  91 108*  --   BUN 27*  --  37* 53*  --   CREATININE 5.38*  --  7.19* 8.99*  --   CALCIUM 9.4  --  9.5 9.6  --   AST 21  --   --   --   --   ALT 20  --   --   --   --   ALKPHOS 105  --   --   --   --   BILITOT 0.6  --   --   --   --   ALBUMIN 4.0  --   --  3.2*  --   PHOS  --   --   --  5.9*  --   PROCALCITON  --   --   --   --  0.41  BNP 191.7*  --   --   --   --        Signature  Lala Lund M.D on 06/24/2022 at 8:41 AM   -  To page go to [email or web address]

## 2022-06-24 NOTE — Plan of Care (Signed)

## 2022-06-24 NOTE — Progress Notes (Signed)
[name] Progress Note   Subjective:  Seen on HD - cold, but no other complaints. Denies CP/dyspnea for now.  Objective Vitals:   06/23/22 2117 06/24/22 0630 06/24/22 0730 06/24/22 0745  BP: 101/65  91/80 107/80  Pulse: 78  79 77  Resp:   18 15  Temp: 97.6 F (36.4 C)  98.7 F (37.1 C)   TempSrc: Oral     SpO2: 97% 95% 97%   Weight:   130.7 kg   Height:       Physical Exam General: Well appearing woman, NAD. Room air. Heart: RRR; no murmur Lungs: CTAB; no rales or wheezing Abdomen: soft Extremities: No LE edema Dialysis Access: LUE AVF with very large hematoma present, still with bruit throughout although somewhat quiet. R [facility]  Additional Objective Labs: Basic Metabolic Panel: Recent Labs  Lab 06/20/22 1228 06/20/22 1254 06/21/22 1100 06/22/22 1252  NA 136 138 139 137  K 3.3* 3.4* 3.9 3.8  CL 94*  --  96* 96*  CO2 34*  --  29 29  GLUCOSE 125*  --  91 108*  BUN 27*  --  37* 53*  CREATININE 5.38*  --  7.19* 8.99*  CALCIUM 9.4  --  9.5 9.6  PHOS  --   --   --  5.9*   Liver Function Tests: Recent Labs  Lab 06/20/22 1228 06/22/22 1252  AST 21  --   ALT 20  --   ALKPHOS 105  --   BILITOT 0.6  --   PROT 8.8*  --   ALBUMIN 4.0 3.2*   CBC: Recent Labs  Lab 06/20/22 1228 06/20/22 1254 06/21/22 1100 06/22/22 1252 06/24/22 0329  WBC 5.7  --  6.1 6.9 7.0  NEUTROABS 3.2  --  3.3  --   --   HGB 10.3*   < > 9.7* 9.1* 9.6*  HCT 32.1*   < > 30.3* 28.9* 29.5*  MCV 94.4  --  94.4 96.3 93.4  PLT 182  --  169 172 181   < > = values in this interval not displayed.   Medications:  ceFEPime (MAXIPIME) IV Stopped (06/23/22 1800)   vancomycin Stopped (06/23/22 0200)    amiodarone  200 mg Oral BID   apixaban  5 mg Oral BID   busPIRone  5 mg Oral BID   Chlorhexidine Gluconate Cloth  6 each Topical Daily   colchicine  0.6 mg Oral Daily   doxercalciferol  1 mcg Intravenous Q T,Th,Sa-HD   DULoxetine  60 mg Oral Daily   guaiFENesin  600 mg Oral  BID   insulin aspart  0-5 Units Subcutaneous QHS   insulin aspart  0-9 Units Subcutaneous TID WC   isosorbide mononitrate  15 mg Oral Daily   metoprolol succinate  25 mg Oral Daily   multivitamin with minerals  1 tablet Oral Daily   senna-docusate  1 tablet Oral BID   torsemide  100 mg Oral Once per day on Mon Wed Fri    Dialysis Orders: AF - TTS 4 hrs 180NRe 400/600 130 kgs 3.0 K/2.5 Ca UFP 2 L AVF/RIJ TDC - Heparin 2500 units IV TIW - Hectorol 1 mcg IV TIW - Mircera 50 mcg IV q 2 weeks (last dose 06/20/2022 Next dose due 07/03/2022)   Assessment/Plan: Acute hypoxic respiratory failure/HCAP: On Vanc/Cefepime. Improving symptoms per patient. ESRD: Continue HD per usual TTS - HD now. Severe infiltration of AVF, using TDC.  Hypertension/volume: Seems ok, CT chest 10/31 c/w  multifocal pneumonia. Room air. Anemia of ESRD: Hgb 9.6, not due for ESA yet. Metabolic bone disease: Corr Ca and Phos up slightly, apparently not on binders as outpatient. Await next level. If still high, will start. Nutrition: Alb low, continue supplements. DMT2: Per primary. Afib-on Eliquis. Currently in SR Combined systolic/diastolic HF-volume appears fine. Continue to optimize volume on HD. Follow with HF. OSA/COPD-continue CPAP, per primary    Leslie Penton, PA-C 06/24/2022, 8:11 AM  [facility]

## 2022-06-24 NOTE — Progress Notes (Signed)
Pt refusing CPAP

## 2022-06-24 NOTE — Progress Notes (Signed)
Pharmacy Antibiotic Note  [name] is a 66 y.o. female admitted on 06/20/2022 with pneumonia.  Pharmacy has been consulted for vancomycin and cefepime dosing. Pt with recent admission to [facility] found to have RSV PNA. Pt taking doxycycline 100 BID x7 days (10/18-10/24). Presented with cough, SHOB, and WBC 5.7, afeb (although reported fevers PTA).   Goal pre HD vancomycin level =15-25 mcg/ml  Plan: Continue Vancomycin  '1000mg'$  IV qHD - TTS and with any additional sessions Cefepime 1g IV q24h  F/u hemodialysis schedule Vancomycin levels as indicated F/u renal function, CBC, cultures  Height: '5\' 7"'$  (170.2 cm) Weight: 128.3 kg (282 lb 13.3 oz) (bed scale) IBW/kg (Calculated) : 61.6  Temp (24hrs), Avg:98.1 F (36.7 C), Min:97.6 F (36.4 C), Max:98.7 F (37.1 C)  Recent Labs  Lab 06/20/22 1228 06/21/22 0533 06/21/22 1100 06/22/22 1252 06/24/22 0329  WBC 5.7  --  6.1 6.9 7.0  CREATININE 5.38*  --  7.19* 8.99* 7.26*  VANCORANDOM  --  35  --   --   --      Estimated Creatinine Clearance: 10.8 mL/min (A) (by C-G formula based on SCr of 7.26 mg/dL (H)).    Allergies  Allergen Reactions   Bee Pollen Anaphylaxis and Other (See Comments)    UNCONFIRMED, but IS allergic to bee VENOM   Bee Venom Anaphylaxis   Sulfa Antibiotics Itching    Antimicrobials this admission: 10/31 vanc >> 10/31 cefepime >>   Dose adjustments this admission: N/a  Microbiology results: 10/31 MRSA PCR:  negative   Thank you for allowing pharmacy to be a part of this patient's care.   Titus Dubin, PharmD PGY1 Pharmacy Resident 06/24/2022 2:29 PM

## 2022-06-24 NOTE — Progress Notes (Signed)
Received patient in bed to unit. Alert and oriented. Informed consent signed and in chart.06/22/22   Treatment initiated: 0730   Treatment completed:1151   Patient tolerated well. Transported back to the room Alert, without acute distress. Hand-off given to patient's nurse.   Access used: perm cath   Access issues: none  Dressing: intact Total UF removed: 2.2  Medication(s) given: hectorol 63mg, vancomycin ivpb Post HD VS: see table below Post HD weight: 128.29 kg   06/24/22 1151  Vitals  BP 108/68  MAP (mmHg) 87  BP Location Right Wrist  BP Method Automatic  Patient Position (if appropriate) Lying  Pulse Rate 81  Pulse Rate Source Monitor  Resp 16  Oxygen Therapy  SpO2 100 %  O2 Device Room Air  Patient Activity (if Appropriate) In bed  Pulse Oximetry Type Continuous  During Treatment Monitoring  HD Safety Checks Performed Yes  Intra-Hemodialysis Comments Tolerated well  Post Treatment  Dialyzer Clearance Clear  Duration of HD Treatment -hour(s) 4 hour(s)  Liters Processed 96  Fluid Removed 2200 mL  Tolerated HD Treatment Yes  Post-Hemodialysis Comments tolerated well  Hemodialysis Catheter Right Subclavian Double lumen Permanent (Tunneled)  Placement Date/Time: 03/14/22 0843   Time Out: Correct patient;Correct site;Correct procedure  Maximum sterile barrier precautions: Hand hygiene;Cap;Mask;Sterile gown;Sterile gloves;Large sterile sheet  Site Prep: Chlorhexidine (preferred)  Local Anes...  Site Condition No complications  Blue Lumen Status Flushed;Heparin locked;Dead end cap in place  Red Lumen Status Flushed;Heparin locked;Dead end cap in place  Catheter fill solution Heparin 1000 units/ml  Catheter fill volume (Arterial) 1.6 cc  Catheter fill volume (Venous) 1.6  Dressing Type Transparent  Dressing Status Antimicrobial disc in place;Clean, Dry, Intact  Post treatment catheter status Capped and Clamped    [facility]

## 2022-06-25 DIAGNOSIS — J189 Pneumonia, unspecified organism: Secondary | ICD-10-CM | POA: Diagnosis not present

## 2022-06-25 LAB — CBC WITH DIFFERENTIAL/PLATELET
Abs Immature Granulocytes: 0.02 10*3/uL (ref 0.00–0.07)
Basophils Absolute: 0 10*3/uL (ref 0.0–0.1)
Basophils Relative: 0 %
Eosinophils Absolute: 0.2 10*3/uL (ref 0.0–0.5)
Eosinophils Relative: 3 %
HCT: 29.6 % — ABNORMAL LOW (ref 36.0–46.0)
Hemoglobin: 9.5 g/dL — ABNORMAL LOW (ref 12.0–15.0)
Immature Granulocytes: 0 %
Lymphocytes Relative: 18 %
Lymphs Abs: 1.2 10*3/uL (ref 0.7–4.0)
MCH: 30.4 pg (ref 26.0–34.0)
MCHC: 32.1 g/dL (ref 30.0–36.0)
MCV: 94.6 fL (ref 80.0–100.0)
Monocytes Absolute: 1.3 10*3/uL — ABNORMAL HIGH (ref 0.1–1.0)
Monocytes Relative: 19 %
Neutro Abs: 4 10*3/uL (ref 1.7–7.7)
Neutrophils Relative %: 60 %
Platelets: 186 10*3/uL (ref 150–400)
RBC: 3.13 MIL/uL — ABNORMAL LOW (ref 3.87–5.11)
RDW: 14.6 % (ref 11.5–15.5)
WBC: 6.7 10*3/uL (ref 4.0–10.5)
nRBC: 0 % (ref 0.0–0.2)

## 2022-06-25 LAB — GLUCOSE, CAPILLARY: Glucose-Capillary: 86 mg/dL (ref 70–99)

## 2022-06-25 LAB — PROCALCITONIN: Procalcitonin: 0.42 ng/mL

## 2022-06-25 MED ORDER — DOXYCYCLINE HYCLATE 100 MG PO CAPS: 100.0000 mg | ORAL_CAPSULE | Freq: Two times a day (BID) | ORAL | 0 refills | Status: DC

## 2022-06-25 NOTE — Progress Notes (Signed)
[facility] Progress Note   Subjective:  Seen in room - coughing and dyspnea are further improved. She is hoping to discharge home today. Did well with HD yesterday - net 2.2L off, will lower her dry weight.  Objective Vitals:   06/24/22 1940 06/24/22 2336 06/25/22 0350 06/25/22 0730  BP: 133/81 106/67 108/67 114/80  Pulse: 81 84 81 81  Resp: '16 18 17 20  '$ Temp: 98 F (36.7 C) 98 F (36.7 C) 98 F (36.7 C) 98.4 F (36.9 C)  TempSrc: Oral Oral Oral Oral  SpO2: 95% 94% 97% 97%  Weight:      Height:       Physical Exam General: Well appearing woman, NAD. Room air. Heart: RRR; no murmur Lungs: CTAB; no rales or wheezing Abdomen: soft Extremities: No LE edema Dialysis Access: LUE AVF with very large hematoma present, + faint bruit throughout. R [facility]  Additional Objective Labs: Basic Metabolic Panel: Recent Labs  Lab 06/21/22 1100 06/22/22 1252 06/24/22 0329  NA 139 137 136  K 3.9 3.8 4.0  CL 96* 96* 92*  CO2 '29 29 26  '$ GLUCOSE 91 108* 85  BUN 37* 53* 45*  CREATININE 7.19* 8.99* 7.26*  CALCIUM 9.5 9.6 10.1  PHOS  --  5.9* 5.7*   Liver Function Tests: Recent Labs  Lab 06/20/22 1228 06/22/22 1252 06/24/22 0329  AST 21  --   --   ALT 20  --   --   ALKPHOS 105  --   --   BILITOT 0.6  --   --   PROT 8.8*  --   --   ALBUMIN 4.0 3.2* 3.2*   CBC: Recent Labs  Lab 06/20/22 1228 06/20/22 1254 06/21/22 1100 06/22/22 1252 06/24/22 0329 06/25/22 0343  WBC 5.7  --  6.1 6.9 7.0 6.7  NEUTROABS 3.2  --  3.3  --   --  4.0  HGB 10.3*   < > 9.7* 9.1* 9.6* 9.5*  HCT 32.1*   < > 30.3* 28.9* 29.5* 29.6*  MCV 94.4  --  94.4 96.3 93.4 94.6  PLT 182  --  169 172 181 186   < > = values in this interval not displayed.   Medications:  ceFEPime (MAXIPIME) IV 1 g (06/24/22 1748)   vancomycin 1,000 mg (06/24/22 1042)    amiodarone  200 mg Oral BID   apixaban  5 mg Oral BID   busPIRone  5 mg Oral BID   Chlorhexidine Gluconate Cloth  6 each Topical Daily    colchicine  0.6 mg Oral Daily   doxercalciferol  1 mcg Intravenous Q T,Th,Sa-HD   DULoxetine  60 mg Oral Daily   guaiFENesin  600 mg Oral BID   insulin aspart  0-5 Units Subcutaneous QHS   insulin aspart  0-9 Units Subcutaneous TID WC   isosorbide mononitrate  15 mg Oral Daily   metoprolol succinate  25 mg Oral Daily   multivitamin with minerals  1 tablet Oral Daily   senna-docusate  1 tablet Oral BID   torsemide  100 mg Oral Once per day on Mon Wed Fri   Dialysis Orders: AF - TTS 4 hrs 180NRe 400/600 130 kgs 3.0 K/2.5 Ca UFP 2 L AVF/RIJ TDC - Heparin 2500 units IV TIW - Hectorol 1 mcg IV TIW - Mircera 50 mcg IV q 2 weeks (last dose 06/20/2022 Next dose due 07/03/2022)   Assessment/Plan: Acute hypoxic respiratory failure/HCAP: On Vanc/Cefepime. Improving symptoms per patient. ESRD: Continue HD  per usual TTS. Severe infiltration of AVF, using TDC. Next HD 11/7. Hypertension/volume: Seems ok, CT chest 10/31 c/w multifocal pneumonia. Room air. Lowering EDW to 128kg. Anemia of ESRD: Hgb 9.5, not due for ESA yet. Metabolic bone disease: Corr Ca and Phos up slightly, apparently not on binders as outpatient - will start as outpatient. Nutrition: Alb low, continue supplements. DMT2: Per primary. Afib-on Eliquis. Currently in SR Combined systolic/diastolic HF-volume appears fine. Continue to optimize volume on HD. Follow with HF. OSA/COPD-continue CPAP, per primary Ok for discharge from renal standpoint.  Veneta Penton, PA-C 06/25/2022, 9:49 AM  [employer]

## 2022-06-25 NOTE — Discharge Summary (Signed)
[name] JSE:[id number] DOB: 03-30-1956 DOA: 06/20/2022  PCP: [name], NP  Admit date: 06/20/2022  Discharge date: 06/25/2022  Admitted From: Home   Disposition:  Home   Recommendations for Outpatient Follow-up:   Follow up with PCP in 1-2 weeks  PCP Please obtain BMP/CBC, 2 view CXR in 1week,  (see Discharge instructions)   PCP Please follow up on the following pending results:    Home Health: Outpt PT   Equipment/Devices: None  Consultations: None  Discharge Condition: Stable    CODE STATUS: Full    Diet Recommendation:  Renal-Low carbohydrate diet with strict 1.5 L fluid restriction    Chief Complaint  Patient presents with   Chest Pain   Shortness of Breath     Brief history of present illness from the day of admission and additional interim summary     66 y.o. female with PMH significant for ESRD-HD-TTS since 01/2022, morbid obesity, OSA on CPAP, DM 2, HTN, HLD, chronic combined systolic and diastolic CHF, TVR AND MVR in May 2022, A Fib s/p maze procedure in 2022, h/o DVT on Eliquis, COPD, asthma was recently hospitalized at [facility] 10/19-10/24 for RSV bronchitis and superimposed bacterial infection, treated with IV Rocephin and discharged home on oral antibiotics which she completed the course as recommended, after discharge, patient continued to have persistent cough with blood-tinged yellow/green sputum, chest congestion, shortness of breath, subjective fevers and had for pneumonia.                                                                 Hospital Course     Pneumonia with acute hypoxic respiratory failure - Recently hospitalized at outside hospital for RSV bronchitis, superimposed bacterial infection and treated with antibiotics, presented with worsening symptoms, 11/1, CT chest  showed mild bilateral patchy airspace opacities are noted most consistent with multifocal pneumonia.  Currently on IV vancomycin and cefepime, cultures negative thus far, cynically better stable on room air on ambulation, symptom-free now will be discharged on 3 more doses of oral doxycycline with outpatient follow-up with PCP.   Hemoptysis - had some blood streaked phlegm likely due to bronchial irritation from cough, CT stable.  Symptom has now resolved, if these recur PCP can refer to pulmonary for outpatient consultation.   COPD/OSA - Continue bronchodilators.  Has an upcoming appointment for sleep study   ESRD-HD-TTS - Nephrology consulted. Left arm AV fistula site had an area of swelling and bruising. Ultrasound duplex negative for thrombosis.  Also has right IJ dialysis catheter.   Chronic combined systolic and diastolic congestive heart failure followed by HF team, H/o TVR AND MVR in May 2022 - remains euvolemic on exam, fluid removal via HD.  Continue home combination of Toprol and torsemide, resume home dose Imdur.  Follows with  Dr. Jeffie Pollock as an outpatient.  History of chronic A Fib s/p maze procedure in 2022 - Continue amiodarone 200 mg twice daily, Eliquis 5 mg twice daily, stable on telemetry transfer to [place].   HLD - Continue Crestor   Anxiety/depression  -  Continue buspirone 5 mg twice daily, Cymbalta 60 mg daily,   Chronic constipation - Scheduled Senokot, as needed Linzess, MiraLAX   Chronic anemia  - Hemoglobin stable at baseline close to 10. Continue PPI.    Type 2 diabetes mellitus with diabetic peripheral neuropathy - A1c 6.7 in April 2023, continue home regimen.   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  Check CBGs q. [place].   Special Instructions: If you have smoked or chewed Tobacco  in the last 2 yrs please stop smoking, stop any regular Alcohol  and or any Recreational drug use.  On your next visit with your primary care physician please Get Medicines reviewed and adjusted.  Please request your Prim.MD to go over all Hospital Tests and Procedure/Radiological results at the follow up, please get all Hospital records sent to your Prim MD by signing hospital release before you go home.  If you experience worsening of your admission symptoms, develop shortness of breath, life threatening emergency, suicidal or homicidal thoughts you must seek medical attention immediately by calling 911 or calling your MD immediately  if symptoms less severe.  You Must read complete instructions/literature along with all the possible adverse reactions/side effects for all the Medicines you take and that have been prescribed to you. Take any new Medicines after you have completely understood and accpet all the possible adverse reactions/side effects.   Increase activity slowly   Complete by: As directed        Discharge Medications   Allergies as of 06/25/2022        Reactions   Bee Pollen Anaphylaxis, Other (See Comments)   UNCONFIRMED, but IS allergic to bee VENOM   Bee Venom Anaphylaxis   Sulfa Antibiotics Itching        Medication List     STOP taking these medications    lidocaine 5 % ointment Commonly known as: XYLOCAINE   mupirocin ointment 2 % Commonly known as: BACTROBAN       TAKE these medications    Accu-Chek Guide test strip Generic drug: glucose blood USE TO CHECK  BLOOD SUGAR 3 TIMES DAILY   acetaminophen 325 MG tablet Commonly known as: TYLENOL Take 2 tablets (650 mg total) by mouth every 6 (six) hours as needed for mild pain or moderate pain.   albuterol 108 (90 Base) MCG/ACT inhaler Commonly known as: VENTOLIN HFA INHALE 1-2 PUFFS BY MOUTH EVERY 6 HOURS AS NEEDED FOR WHEEZE OR SHORTNESS OF BREATH   amiodarone 200 MG tablet Commonly known as: PACERONE TAKE 1 TABLET BY MOUTH TWICE A DAY   apixaban 5 MG Tabs tablet Commonly known as: Eliquis TAKE ONE TABLET BY MOUTH TWICE DAILY at 9AM & 5PM What changed:  how much to take how to take this when to take this additional instructions   benzonatate 100 MG capsule Commonly known as: Tessalon Perles Take 1 capsule (100 mg total) by mouth 3 (three) times daily as needed for cough.   busPIRone 5 MG tablet Commonly known as: BUSPAR Take 1 tablet (5 mg total) by mouth 2 (two) times daily.   colchicine 0.6 MG tablet TAKE 0.5 TABLETS BY MOUTH DAILY.   diphenhydrAMINE 25 mg capsule Commonly known as: BENADRYL Take 25 mg by mouth every 6 (six) hours as needed for allergies or itching.   doxycycline 100 MG capsule Commonly known as: VIBRAMYCIN Take 1 capsule (100 mg total) by mouth 2 (two) times daily.   DULoxetine 60 MG capsule Commonly known as: CYMBALTA Take 1 capsule (60 mg total) by mouth daily.   EPINEPHrine 0.3 mg/0.3 mL Soaj injection Commonly known as: EPI-PEN Inject 0.3 mg into the muscle as needed for anaphylaxis.   fluticasone 50  MCG/ACT nasal spray Commonly known as: FLONASE Place 2 sprays into both nostrils daily as needed for allergies or rhinitis.   guaiFENesin 200 MG tablet Take 1 tablet (200 mg total) by mouth every 4 (four) hours as needed for cough or to loosen phlegm.   isosorbide mononitrate 30 MG 24 hr tablet Commonly known as: IMDUR Take 0.5 tablets (15 mg total) by mouth daily.   Klor-Con M20 20 MEQ tablet Generic drug: potassium chloride SA Take 20 mEq by mouth 2 (two) times daily.   lidocaine-prilocaine cream Commonly known as: EMLA SMARTSIG:sparingly Topical 3 Times a Week   Linzess 72 MCG capsule Generic drug: linaclotide TAKE 1 CAPSULE (72 MCG TOTAL) BY MOUTH DAILY BEFORE BREAKFAST. IF BM NOT ACHIEVE WITH 1 CAPSULE SHE MAY TAKE 2 DAILY. STOP IF DIARRHEA OCCURS   loratadine 10 MG tablet Commonly known as: CLARITIN Take 1 tablet (10 mg total) by mouth daily.   metolazone 2.5 MG tablet Commonly known as: ZAROXOLYN Take 2.5 mg by mouth 2 (two) times a week.   metoprolol succinate 25 MG 24 hr tablet Commonly known as: TOPROL-XL Take 1 tablet (25 mg total) by mouth daily.   multivitamin Tabs tablet Take 1 tablet by mouth daily.   MULTIVITAMIN WOMEN PO Take 1 tablet by mouth daily.   nitroGLYCERIN 0.4 MG SL tablet Commonly known as: NITROSTAT Place 1 tablet (0.4 mg total) under the tongue every 5 (five) minutes x 3 doses as needed for chest pain.   NovoLOG 100 UNIT/ML injection Generic drug: insulin aspart Inject 2-10 Units into the skin 3 (three) times daily before meals. Per sliding scale What changed:  when to take this additional instructions   pantoprazole 40 MG tablet Commonly known as: PROTONIX TAKE ONE TABLET BY MOUTH DAILY AT 9AM   polyethylene glycol powder 17 GM/SCOOP powder Commonly known as: GLYCOLAX/MIRALAX Take 17 g by mouth daily. What  changed:  when to take this reasons to take this   pregabalin 100 MG capsule Commonly known as: LYRICA TAKE 1 CAPSULE  BY MOUTH THREE TIMES A DAY What changed: See the new instructions.   Restasis 0.05 % ophthalmic emulsion Generic drug: cycloSPORINE Place 1 drop into both eyes in the morning and at bedtime.   rosuvastatin 10 MG tablet Commonly known as: CRESTOR TAKE ONE TABLET BY MOUTH DAILY AT 5PM   senna-docusate 8.6-50 MG tablet Commonly known as: Senokot-S Take 1 tablet by mouth 2 (two) times daily.   torsemide 100 MG tablet Commonly known as: DEMADEX Take 1 tablet by mouth. Monday Wednesday and Friday   Trulicity 1.5 AV/4.0JW Sopn Generic drug: Dulaglutide INJECT 1.5 MG INTO THE SKIN ONCE A WEEK. What changed: when to take this         Follow-up Information     [name], NP. Schedule an appointment as soon as possible for a visit in 1 week(s).   Specialty: Internal Medicine Contact information: [place] [id number] [phone]                 Major procedures and Radiology Reports - PLEASE review detailed and final reports thoroughly  -      VAS [place] UPPER EXTREMITY VENOUS DUPLEX  Result Date: 06/21/2022 UPPER VENOUS STUDY  Patient Name:  JERALDEAN WECHTER  Date of Exam:   06/21/2022 Medical Rec #: [id number]       Accession #:    [id number] Date of Birth: 10-29-1955       Patient Gender: F Patient Age:   66 years Exam Location:  [facility] Procedure:      VAS [place] UPPER EXTREMITY VENOUS DUPLEX Referring Phys: NA LI --------------------------------------------------------------------------------  Indications: Pain and swelling of left upper arm, site of brachio-cephalic AVF Other Indications: History of DVT lower extremity. Risk Factors: Surgery 03-17-2022 Left brachio-cephalic AVF creation. Anticoagulation: Eliquis. Comparison Study: 05-04-2022 Prior duplex dialysis access showed patent AVF Performing Technologist: Darlin Coco RDMS, RVT  Examination Guidelines: A complete evaluation includes B-mode imaging, spectral Doppler, color Doppler, and  power Doppler as needed of all accessible portions of each vessel. Bilateral testing is considered an integral part of a complete examination. Limited examinations for reoccurring indications may be performed as noted.  Right Findings: +----------+------------+---------+-----------+----------+-------+ RIGHT     CompressiblePhasicitySpontaneousPropertiesSummary +----------+------------+---------+-----------+----------+-------+ Subclavian               Yes       Yes                      +----------+------------+---------+-----------+----------+-------+  Left Findings: +----------+------------+---------+-----------+----------+----------+ LEFT      CompressiblePhasicitySpontaneousProperties Summary   +----------+------------+---------+-----------+----------+----------+ IJV                      Yes       Yes                         +----------+------------+---------+-----------+----------+----------+ Subclavian               Yes       Yes                         +----------+------------+---------+-----------+----------+----------+ Axillary      Full       Yes       Yes                         +----------+------------+---------+-----------+----------+----------+  Brachial      Full                                             +----------+------------+---------+-----------+----------+----------+ Radial        Full                                             +----------+------------+---------+-----------+----------+----------+ Ulnar         Full                                             +----------+------------+---------+-----------+----------+----------+ Cephalic                                            Patent AVF +----------+------------+---------+-----------+----------+----------+ Basilic       Full                                             +----------+------------+---------+-----------+----------+----------+  Summary:  Right: No evidence of  thrombosis in the subclavian.  Left: No evidence of deep vein thrombosis in the upper extremity. No evidence of superficial vein thrombosis in the upper extremity.  Incidental: Avascular, heterogenous focal collection noted at the mid upper arm, deep to the cephalic AVF. Measures 2.2 cm and appears to extend >6cm from mid to distal upper arm.  *See table(s) above for measurements and observations.  Diagnosing physician: Monica Martinez MD Electronically signed by Monica Martinez MD on 06/21/2022 at 1:40:27 PM.    Final    CT CHEST WO CONTRAST  Result Date: 06/21/2022 CLINICAL DATA:  Shortness of breath, chest pain. EXAM: CT CHEST WITHOUT CONTRAST TECHNIQUE: Multidetector CT imaging of the chest was performed following the standard protocol without IV contrast. RADIATION DOSE REDUCTION: This exam was performed according to the departmental dose-optimization program which includes automated exposure control, adjustment of the mA and/or kV according to patient size and/or use of iterative reconstruction technique. COMPARISON:  Radiograph same day.  CT scan of March 08, 2022. FINDINGS: Cardiovascular: Atherosclerosis of thoracic aorta is noted without aneurysm formation. Mild cardiomegaly is noted. Status post mitral and tricuspid valve repair. No pericardial effusion is noted. Right internal jugular catheter is noted with tip in right atrium. Mediastinum/Nodes: No enlarged mediastinal or axillary lymph nodes. Thyroid gland, trachea, and esophagus demonstrate no significant findings. Lungs/Pleura: No pneumothorax or pleural effusion is noted. Bilateral patchy airspace opacities are noted most consistent with multifocal inflammation. Upper Abdomen: No acute abnormality. Musculoskeletal: No chest wall mass or suspicious bone lesions identified. IMPRESSION: Mild bilateral patchy airspace opacities are noted most consistent with multifocal pneumonia. Aortic Atherosclerosis (ICD10-I70.0). Electronically Signed   By:  Marijo Conception M.D.   On: 06/21/2022 13:13   DG CHEST PORT 1 VIEW  Result Date: 06/21/2022 CLINICAL DATA:  Cough. EXAM: PORTABLE CHEST 1 VIEW COMPARISON:  June 20, 2022. FINDINGS: Stable cardiomegaly. Right internal jugular dialysis catheter is unchanged in position. Status  post cardiac valve repair. No acute pulmonary disease is noted. Bony thorax is unremarkable. IMPRESSION: No acute cardiopulmonary abnormality seen. Electronically Signed   By: Marijo Conception M.D.   On: 06/21/2022 10:21   DG Chest 2 View  Result Date: 06/20/2022 CLINICAL DATA:  Chest pain cough. EXAM: CHEST - 2 VIEW COMPARISON:  June 08, 2022. FINDINGS: RIGHT-sided dialysis catheter terminates in the RIGHT atrium. Median sternotomy changes and evidence of prior LEFT atrial appendage clipping similar to previous imaging. Also with changes of valvular replacement. EKG leads project over the chest. Cardiomediastinal contours and hilar structures are stable accounting for low lung volumes. There is cardiomegaly. Central pulmonary vascular engorgement. No lobar consolidative process. No pneumothorax. On limited assessment no acute skeletal process. IMPRESSION: 1. Cardiomegaly with central pulmonary vascular engorgement. 2. No frank edema, lobar consolidation or sign of pleural effusion. Correlate with any clinical signs of heart failure or volume overload. 3. Dialysis catheter terminates in the RIGHT atrium. Electronically Signed   By: Zetta Bills M.D.   On: 06/20/2022 13:51    Micro Results    Recent Results (from the past 240 hour(s))  SARS Coronavirus 2 by RT PCR (hospital order, performed in [facility] hospital lab) *cepheid single result test* Anterior Nasal Swab     Status: None   Collection Time: 06/20/22 12:51 PM   Specimen: Anterior Nasal Swab  Result Value Ref Range Status   SARS Coronavirus 2 by RT PCR NEGATIVE NEGATIVE Final    Comment: (NOTE) SARS-CoV-2 target nucleic acids are NOT DETECTED.  The  SARS-CoV-2 RNA is generally detectable in upper and lower respiratory specimens during the acute phase of infection. The lowest concentration of SARS-CoV-2 viral copies this assay can detect is 250 copies / mL. A negative result does not preclude SARS-CoV-2 infection and should not be used as the sole basis for treatment or other patient management decisions.  A negative result may occur with improper specimen collection / handling, submission of specimen other than nasopharyngeal swab, presence of viral mutation(s) within the areas targeted by this assay, and inadequate number of viral copies (<250 copies / mL). A negative result must be combined with clinical observations, patient history, and epidemiological information.  Fact Sheet for Patients:   https://www.patel.info/  Fact Sheet for Healthcare Providers: https://hall.com/  This test is not yet approved or  cleared by the [place] FDA and has been authorized for detection and/or diagnosis of SARS-CoV-2 by FDA under an Emergency Use Authorization (EUA).  This EUA will remain in effect (meaning this test can be used) for the duration of the COVID-19 declaration under Section 564(b)(1) of the Act, 21 U.S.C. section 360bbb-3(b)(1), unless the authorization is terminated or revoked sooner.  Performed at [facility], [address] [place], [place] [address]   MRSA Next Gen by PCR, Nasal     Status: None   Collection Time: 06/20/22  4:01 PM   Specimen: Nasal Mucosa; Nasal Swab  Result Value Ref Range Status   MRSA by PCR Next Gen NOT DETECTED NOT DETECTED Final    Comment: (NOTE) The GeneXpert MRSA Assay (FDA approved for NASAL specimens only), is one component of a comprehensive MRSA colonization surveillance program. It is not intended to diagnose MRSA infection nor to guide or monitor treatment for MRSA infections. Test performance is not FDA approved in  patients less than 66 years old. Performed at [facility], [place] [address] [place], [place] [address]     Today  Subjective    [name] today has no headache,no chest abdominal pain,no new weakness tingling or numbness, feels much better wants to go home today.    Objective   Blood pressure 114/80, pulse 81, temperature 98.4 F (36.9 C), temperature source Oral, resp. rate 20, height '5\' 7"'$  (1.702 m), weight 128.3 kg, SpO2 97 %.   Intake/Output Summary (Last 24 hours) at 06/25/2022 0949 Last data filed at 06/24/2022 1235 Gross per 24 hour  Intake 120 ml  Output 2200 ml  Net -2080 ml    Exam  Awake Alert, No new F.N deficits,    [place].AT,PERRAL Supple Neck,   Symmetrical Chest wall movement, Good air movement bilaterally, CTAB RRR,No Gallops,   +ve B.Sounds, Abd Soft, Non tender,  No Cyanosis, Clubbing or edema    Data Review   Recent Labs  Lab 06/20/22 1228 06/20/22 1254 06/21/22 1100 06/22/22 1252 06/24/22 0329 06/25/22 0343  WBC 5.7  --  6.1 6.9 7.0 6.7  HGB 10.3* 11.6* 9.7* 9.1* 9.6* 9.5*  HCT 32.1* 34.0* 30.3* 28.9* 29.5* 29.6*  PLT 182  --  169 172 181 186  MCV 94.4  --  94.4 96.3 93.4 94.6  MCH 30.3  --  30.2 30.3 30.4 30.4  MCHC 32.1  --  32.0 31.5 32.5 32.1  RDW 14.8  --  14.8 14.8 14.5 14.6  LYMPHSABS 1.2  --  1.4  --   --  1.2  MONOABS 1.0  --  1.2*  --   --  1.3*  EOSABS 0.2  --  0.2  --   --  0.2  BASOSABS 0.0  --  0.0  --   --  0.0    Recent Labs  Lab 06/20/22 1228 06/20/22 1254 06/21/22 1100 06/22/22 1252 06/24/22 0329 06/25/22 0343  NA 136 138 139 137 136  --   K 3.3* 3.4* 3.9 3.8 4.0  --   CL 94*  --  96* 96* 92*  --   CO2 34*  --  '29 29 26  '$ --   GLUCOSE 125*  --  91 108* 85  --   BUN 27*  --  37* 53* 45*  --   CREATININE 5.38*  --  7.19* 8.99* 7.26*  --   CALCIUM 9.4  --  9.5 9.6 10.1  --   AST 21  --   --   --   --   --   ALT 20  --   --   --   --   --   ALKPHOS 105  --   --   --   --   --   BILITOT 0.6  --   --    --   --   --   ALBUMIN 4.0  --   --  3.2* 3.2*  --   PHOS  --   --   --  5.9* 5.7*  --   PROCALCITON  --   --   --   --  0.41 0.42  BNP 191.7*  --   --   --   --   --     Total Time in preparing paper work, data evaluation and todays exam - 35 minutes  Lala Lund M.D on 06/25/2022 at 9:49 AM  [facility]

## 2022-06-25 NOTE — TOC Transition Note (Signed)
Transition of Care [facility]) - CM/SW Discharge Note   Patient Details  Name: [name] MRN: [id number] Date of Birth: 21-May-1956  Transition of [facility]) CM/SW Contact:  Carles Collet, RN Phone Number: 06/25/2022, 11:07 AM   Clinical Narrative:      Spoke to patient at bedside. Discussed recs for OP PT. Patient agreeable to referral. Would like to go to [facility]. Referral placed through Epic, patient encouraged to call Monday  for faster scheduling. No other TOC needs identified for DC       Patient Goals and CMS Choice        Discharge Placement                       Discharge Plan and Services                                     Social Determinants of Health (SDOH) Interventions     Readmission Risk Interventions    01/20/2022    5:06 PM 08/31/2021    1:49 PM 02/10/2021   10:17 AM  Readmission Risk Prevention Plan  Transportation Screening Complete Complete Complete  Medication Review (RN Care Manager)  Referral to Pharmacy Complete  PCP or Specialist appointment within 3-5 days of discharge Complete Complete Complete  HRI or Home Care Consult  Complete Complete  SW Recovery Care/Counseling Consult Complete Complete Complete  Palliative Care Screening Not Applicable Not Applicable Not [place] Not Applicable Not Applicable Not Applicable

## 2022-06-25 NOTE — Evaluation (Signed)
Physical Therapy Evaluation and Discharge Patient Details Name: [name] MRN: [id number] DOB: 1956-06-06 Today's Date: 06/25/2022  History of Present Illness  66 y.o. female with PMH significant for ESRD-HD-TTS since 01/2022, morbid obesity, OSA on CPAP, DM 2, HTN, HLD, chronic combined systolic and diastolic CHF, TVR AND MVR in May 2022, A Fib s/p maze procedure in 2022, h/o DVT on Eliquis, COPD, asthma was recently hospitalized at [facility] 10/19-10/24 for RSV bronchitis and superimposed bacterial infection Admitted 06/20/22 for   Pneumonia with acute hypoxic respiratory failure   .  Clinical Impression  Patient evaluated by Physical Therapy with no further acute PT needs identified. All education has been completed and the patient has no further questions. Ambulates without physical assist 95 feet on room air, SpO2 97%. Feels she is back to baseline. Easily fatigued; discussed OPPT or cardiac rehab due to deconditioning - pt declines need. Has adequate family support at home. Denies any falls, denies stairs to climb, uses a SPC at baseline. See below for any follow-up Physical Therapy or equipment needs. PT is signing off. Thank you for this referral.        Recommendations for follow up therapy are one component of a multi-disciplinary discharge planning process, led by the attending physician.  Recommendations may be updated based on patient status, additional functional criteria and insurance authorization.  Follow Up Recommendations Outpatient PT (deconditioned - pt declines OPPT.)      Assistance Recommended at Discharge PRN  Patient can return home with the following  Assistance with cooking/housework;Assist for transportation    Equipment Recommendations None recommended by PT  Recommendations for Other Services       Functional Status Assessment Patient has not had a recent decline in their functional status     Precautions / Restrictions Precautions Precautions:  None Restrictions Weight Bearing Restrictions: No      Mobility  Bed Mobility Overal bed mobility: Independent                  Transfers Overall transfer level: Modified independent Equipment used: Rolling walker (2 wheels)               General transfer comment: Good strength to stand. Performed with RW upon standing but no evidence of instability    Ambulation/Gait Ambulation/Gait assistance: Supervision Gait Distance (Feet): 95 Feet Assistive device: Rolling walker (2 wheels) Gait Pattern/deviations: Step-through pattern Gait velocity: decreased Gait velocity interpretation: <1.8 ft/sec, indicate of risk for recurrent falls   General Gait Details: Good control with RW after education for use. Performed in congested area without evidence of LOB. Declines trying to amb without AD. SPO2 97% on RA. Moderate DOE; needed to sit at this distance.  Stairs            Wheelchair Mobility    Modified Rankin (Stroke Patients Only)       Balance Overall balance assessment: Mild deficits observed, not formally tested                                           Pertinent Vitals/Pain Pain Assessment Pain Assessment: No/denies pain    Home Living Family/patient expects to be discharged to:: Private residence Living Arrangements: Children Available Help at Discharge: Family;Available 24 hours/day Type of Home: Apartment Home Access: Level entry       Home Layout: One level Home Equipment: Cane - single  point Additional Comments: Lives with daughter and older sister now. Someone is always home.    Prior Function Prior Level of Function : Independent/Modified Independent             Mobility Comments: Denies falls (different response from recent admission.) does not drive, uses electric scooter at grocery store ADLs Comments: States that she cooks and does her own ADLs, again different response from when she was last seen by PT  6/23     Hand Dominance   Dominant Hand: Right    Extremity/Trunk Assessment   Upper Extremity Assessment Upper Extremity Assessment: Defer to OT evaluation    Lower Extremity Assessment Lower Extremity Assessment: Generalized weakness       Communication   Communication: No difficulties  Cognition Arousal/Alertness: Awake/alert Behavior During Therapy: Flat affect Overall Cognitive Status: Within Functional Limits for tasks assessed                                          General Comments General comments (skin integrity, edema, etc.): SpO2 95% at rest RA, 97% amb, HR 85,    Exercises     Assessment/Plan    PT Assessment Patient does not need any further PT services  PT Problem List Decreased strength;Decreased activity tolerance;Decreased mobility;Cardiopulmonary status limiting activity;Obesity       PT Treatment Interventions DME instruction;Gait training;Functional mobility training;Therapeutic activities;Patient/family education    PT Goals (Current goals can be found in the Care Plan section)  Acute Rehab PT Goals Patient Stated Goal: none stated PT Goal Formulation: All assessment and education complete, DC therapy    Frequency       Co-evaluation               AM-PAC PT "6 Clicks" Mobility  Outcome Measure Help needed turning from your back to your side while in a flat bed without using bedrails?: None Help needed moving from lying on your back to sitting on the side of a flat bed without using bedrails?: None Help needed moving to and from a bed to a chair (including a wheelchair)?: None Help needed standing up from a chair using your arms (e.g., wheelchair or bedside chair)?: None Help needed to walk in hospital room?: None Help needed climbing 3-5 steps with a railing? : None 6 Click Score: 24    End of Session Equipment Utilized During Treatment: Gait belt Activity Tolerance: Patient tolerated treatment well Patient  left: in chair;with call bell/phone within reach;with chair alarm set   PT Visit Diagnosis: Muscle weakness (generalized) (M62.81)    Time: 1497-0263 PT Time Calculation (min) (ACUTE ONLY): 26 min   Charges:   PT Evaluation $PT Eval Low Complexity: 1 Low PT Treatments $Gait Training: 8-22 mins        Candie Mile, PT, DPT Physical Therapist Acute Rehabilitation Services [facility] 06/25/2022, 8:39 AM

## 2022-06-25 NOTE — Plan of Care (Signed)
  Problem: Education: Goal: Knowledge of General Education information will improve Description Including pain rating scale, medication(s)/side effects and non-pharmacologic comfort measures Outcome: Progressing   

## 2022-06-25 NOTE — Discharge Instructions (Signed)
Follow with Primary MD [name], NP in 7 days   Get CBC, CMP, 2 view Chest X ray -  checked next visit within 1 week by Primary MD    Activity: As tolerated with Full fall precautions use walker/cane & assistance as needed  Disposition Home    Diet:- Renal - Low carbohydrate diet with strict 1.5 L fluid restriction per day.  Check CBGs q. [place].   Special Instructions: If you have smoked or chewed Tobacco  in the last 2 yrs please stop smoking, stop any regular Alcohol  and or any Recreational drug use.  On your next visit with your primary care physician please Get Medicines reviewed and adjusted.  Please request your Prim.MD to go over all Hospital Tests and Procedure/Radiological results at the follow up, please get all Hospital records sent to your Prim MD by signing hospital release before you go home.  If you experience worsening of your admission symptoms, develop shortness of breath, life threatening emergency, suicidal or homicidal thoughts you must seek medical attention immediately by calling 911 or calling your MD immediately  if symptoms less severe.  You Must read complete instructions/literature along with all the possible adverse reactions/side effects for all the Medicines you take and that have been prescribed to you. Take any new Medicines after you have completely understood and accpet all the possible adverse reactions/side effects.

## 2022-06-26 ENCOUNTER — Institutional Professional Consult (permissible substitution): Payer: 59 | Admitting: Cardiology

## 2022-06-26 ENCOUNTER — Telehealth: Payer: Self-pay

## 2022-06-26 NOTE — Telephone Encounter (Signed)
Transition Care Management Unsuccessful Follow-up Telephone Call  Date of discharge and from where:  06/25/2022, [facility]  Attempts:  1st Attempt  Reason for unsuccessful TCM follow-up call:  Left voice message [phone] , call back requested.   Patient has an appointment at [facility] with Juluis Mire, NP -07/05/2022.

## 2022-06-27 ENCOUNTER — Telehealth: Payer: Self-pay

## 2022-06-27 NOTE — Telephone Encounter (Signed)
Transition Care Management Unsuccessful Follow-up Telephone Call  Date of discharge and from where:  06/25/2022, [facility]  Attempts:  2nd Attempt  Reason for unsuccessful TCM follow-up call:  Left voice message [phone] , call back requested.    Patient has an appointment at [facility] with Juluis Mire, NP -07/05/2022.

## 2022-06-28 ENCOUNTER — Other Ambulatory Visit: Payer: Self-pay

## 2022-06-28 ENCOUNTER — Emergency Department (HOSPITAL_COMMUNITY)
Admission: EM | Admit: 2022-06-28 | Discharge: 2022-06-29 | Disposition: A | Discharge status: Home / Self Care | Payer: Medicare Other | Attending: Emergency Medicine | Admitting: Emergency Medicine

## 2022-06-28 ENCOUNTER — Emergency Department (HOSPITAL_COMMUNITY): Payer: Medicare Other

## 2022-06-28 ENCOUNTER — Telehealth: Payer: Self-pay

## 2022-06-28 ENCOUNTER — Encounter (HOSPITAL_COMMUNITY): Payer: Self-pay

## 2022-06-28 ENCOUNTER — Other Ambulatory Visit (INDEPENDENT_AMBULATORY_CARE_PROVIDER_SITE_OTHER): Payer: Self-pay | Admitting: Primary Care

## 2022-06-28 DIAGNOSIS — Z992 Dependence on renal dialysis: Secondary | ICD-10-CM | POA: Insufficient documentation

## 2022-06-28 DIAGNOSIS — J449 Chronic obstructive pulmonary disease, unspecified: Secondary | ICD-10-CM | POA: Insufficient documentation

## 2022-06-28 DIAGNOSIS — J45909 Unspecified asthma, uncomplicated: Secondary | ICD-10-CM | POA: Insufficient documentation

## 2022-06-28 DIAGNOSIS — N186 End stage renal disease: Secondary | ICD-10-CM | POA: Diagnosis not present

## 2022-06-28 DIAGNOSIS — Z7901 Long term (current) use of anticoagulants: Secondary | ICD-10-CM | POA: Diagnosis not present

## 2022-06-28 DIAGNOSIS — E1122 Type 2 diabetes mellitus with diabetic chronic kidney disease: Secondary | ICD-10-CM | POA: Insufficient documentation

## 2022-06-28 DIAGNOSIS — Z79899 Other long term (current) drug therapy: Secondary | ICD-10-CM | POA: Diagnosis not present

## 2022-06-28 DIAGNOSIS — E1169 Type 2 diabetes mellitus with other specified complication: Secondary | ICD-10-CM

## 2022-06-28 DIAGNOSIS — Z794 Long term (current) use of insulin: Secondary | ICD-10-CM | POA: Diagnosis not present

## 2022-06-28 DIAGNOSIS — I4891 Unspecified atrial fibrillation: Secondary | ICD-10-CM | POA: Insufficient documentation

## 2022-06-28 DIAGNOSIS — Z20822 Contact with and (suspected) exposure to covid-19: Secondary | ICD-10-CM | POA: Diagnosis not present

## 2022-06-28 DIAGNOSIS — R4182 Altered mental status, unspecified: Secondary | ICD-10-CM | POA: Insufficient documentation

## 2022-06-28 DIAGNOSIS — Z760 Encounter for issue of repeat prescription: Secondary | ICD-10-CM

## 2022-06-28 DIAGNOSIS — R479 Unspecified speech disturbances: Secondary | ICD-10-CM | POA: Diagnosis not present

## 2022-06-28 LAB — CBC
HCT: 32.8 % — ABNORMAL LOW (ref 36.0–46.0)
Hemoglobin: 10.7 g/dL — ABNORMAL LOW (ref 12.0–15.0)
MCH: 31.3 pg (ref 26.0–34.0)
MCHC: 32.6 g/dL (ref 30.0–36.0)
MCV: 95.9 fL (ref 80.0–100.0)
Platelets: 185 10*3/uL (ref 150–400)
RBC: 3.42 MIL/uL — ABNORMAL LOW (ref 3.87–5.11)
RDW: 15.3 % (ref 11.5–15.5)
WBC: 5.5 10*3/uL (ref 4.0–10.5)
nRBC: 0 % (ref 0.0–0.2)

## 2022-06-28 LAB — COMPREHENSIVE METABOLIC PANEL
ALT: 27 U/L (ref 0–44)
AST: 30 U/L (ref 15–41)
Albumin: 3.5 g/dL (ref 3.5–5.0)
Alkaline Phosphatase: 103 U/L (ref 38–126)
Anion gap: 13 (ref 5–15)
BUN: 34 mg/dL — ABNORMAL HIGH (ref 8–23)
CO2: 29 mmol/L (ref 22–32)
Calcium: 9.6 mg/dL (ref 8.9–10.3)
Chloride: 99 mmol/L (ref 98–111)
Creatinine, Ser: 8.08 mg/dL — ABNORMAL HIGH (ref 0.44–1.00)
GFR, Estimated: 5 mL/min — ABNORMAL LOW (ref >60)
Glucose, Bld: 94 mg/dL (ref 70–99)
Potassium: 4.2 mmol/L (ref 3.5–5.1)
Sodium: 141 mmol/L (ref 135–145)
Total Bilirubin: 0.7 mg/dL (ref 0.3–1.2)
Total Protein: 8.3 g/dL — ABNORMAL HIGH (ref 6.5–8.1)

## 2022-06-28 LAB — ETHANOL: Alcohol, Ethyl (B): <10 mg/dL (ref <10)

## 2022-06-28 LAB — DIFFERENTIAL
Abs Immature Granulocytes: 0.01 10*3/uL (ref 0.00–0.07)
Basophils Absolute: 0 10*3/uL (ref 0.0–0.1)
Basophils Relative: 1 %
Eosinophils Absolute: 0.3 10*3/uL (ref 0.0–0.5)
Eosinophils Relative: 5 %
Immature Granulocytes: 0 %
Lymphocytes Relative: 26 %
Lymphs Abs: 1.4 10*3/uL (ref 0.7–4.0)
Monocytes Absolute: 1.4 10*3/uL — ABNORMAL HIGH (ref 0.1–1.0)
Monocytes Relative: 26 %
Neutro Abs: 2.3 10*3/uL (ref 1.7–7.7)
Neutrophils Relative %: 42 %

## 2022-06-28 LAB — RESP PANEL BY RT-PCR (FLU A&B, COVID) ARPGX2
Influenza A by PCR: NEGATIVE
Influenza B by PCR: NEGATIVE
SARS Coronavirus 2 by RT PCR: NEGATIVE

## 2022-06-28 LAB — PROTIME-INR
INR: 2 — ABNORMAL HIGH (ref 0.8–1.2)
Prothrombin Time: 22.3 seconds — ABNORMAL HIGH (ref 11.4–15.2)

## 2022-06-28 LAB — I-STAT CHEM 8, ED
BUN: 36 mg/dL — ABNORMAL HIGH (ref 8–23)
Calcium, Ion: 1.07 mmol/L — ABNORMAL LOW (ref 1.15–1.40)
Chloride: 98 mmol/L (ref 98–111)
Creatinine, Ser: 8.8 mg/dL — ABNORMAL HIGH (ref 0.44–1.00)
Glucose, Bld: 91 mg/dL (ref 70–99)
HCT: 33 % — ABNORMAL LOW (ref 36.0–46.0)
Hemoglobin: 11.2 g/dL — ABNORMAL LOW (ref 12.0–15.0)
Potassium: 4.2 mmol/L (ref 3.5–5.1)
Sodium: 140 mmol/L (ref 135–145)
TCO2: 29 mmol/L (ref 22–32)

## 2022-06-28 MED ORDER — ACCU-CHEK GUIDE W/DEVICE KIT: PACK | 0 refills | Status: DC

## 2022-06-28 MED ORDER — TRULICITY 1.5 MG/0.5ML ~~LOC~~ SOAJ: 1.5000 mg | SUBCUTANEOUS | 2 refills | Status: DC

## 2022-06-28 MED ORDER — NOVOLOG FLEXPEN 100 UNIT/ML ~~LOC~~ SOPN: PEN_INJECTOR | SUBCUTANEOUS | 1 refills | Status: DC

## 2022-06-28 NOTE — ED Provider Triage Note (Signed)
Emergency Medicine Provider Triage Evaluation Note  [name] , a 66 y.o. female  was evaluated in triage.  Pt complains of headache along with stuttering which began this evening. Recent diagnosed with pneumonia while at [facility] and started on antibiotics. Family called EMS for concern of CVA.  Patient here follows commands loosely, new stuttering present.  Febrile with temperature of 99.6, concern for sepsis.  Review of Systems  Positive: Fever, headache Negative:   Physical Exam  There were no vitals taken for this visit. Gen:   Awake, no distress   Resp:  Normal effort  MSK:   Moves extremities without difficulty  Other:  Difficult to auscultate due to stuttering  Medical Decision Making  Medically screening exam initiated at 5:56 PM.  Appropriate orders placed.  [name] was informed that the remainder of the evaluation will be completed by another provider, this initial triage assessment does not replace that evaluation, and the importance of remaining in the ED until their evaluation is complete.  Concern for sepsis versus CVA   Janeece Fitting, PA-C 06/28/22 1802

## 2022-06-28 NOTE — Discharge Instructions (Signed)
Your history, exam, work-up today did not show evidence of acute stroke or TIA causing your speech abnormality.  The neurologist to came to evaluate you had seen you earlier this year and feel this is a similar functional speech abnormality and not due to stroke, seizure, infection, or other acute abnormality.  He felt that if the imaging was reassuring which both your CT and MRI did not show acute stroke that he would be safe for discharge home with close follow-up with neurology and PCP.  Your other work-up also did not show evidence of new pneumonia, your labs were overall reassuring and similar to prior and with your reassuring vitals we feel you are safe for discharge home.  We had a shared decision-making conversation and as you did not make much urine and are having no urinary symptoms we agreed to hold on making you wait for urinalysis at this time.  Please continue your dialysis and follow-up with your PCP.  If any symptoms acutely change or worsen, please turn to the nearest emergency department.

## 2022-06-28 NOTE — Telephone Encounter (Signed)
Transition Care Management Follow-up Telephone Call Date of discharge and from where: 06/25/2022, [facility]  How have you been since you were released from the hospital? She said she is feeling all right.  She then mentioned that she is " still shaky" , has a cough and has experienced nausea in the morning and during the day.  She was taking her grandson to an appointment at the time I was speaking with her.  She said that the SW at dialysis yesterday commented on her shaking. In addition she said that sometimes her speech is slurred. The speech was not slurred while I was speaking with her .  We reviewed the signs of a stroke and discussed the importance of seeking immediate medical attention if those signs, including slurred speech occur and she said she understands.   She would like a rollator and I told her she can discuss with PCP at upcoming appointment.   She does not have the doxycycline and said that she called her pharmacy and they told her that they never received the prescription.  I called [facility] and spoke to [name] in the pharmacy and he confirmed that they do not have an order.   She said she needs a refill for her insulin.   She also needs a glucometer. I asked her how she has been taking her novolog per sliding scale and she said she has been using a friend's glucometer.   She stated that she never received a CPAP machine.   Any questions or concerns? Yes- noted above  Items Reviewed: Did the pt receive and understand the discharge instructions provided? Yes  Medications obtained and verified? Yes - except for the doxycycline as mentioned above.  Other? No  Any new allergies since your discharge? No  Dietary orders reviewed? Yes- she also said that she is adhering to a 1.5 L /day fluid restriction.  Do you have support at home? Yes   Home Care and Equipment/Supplies: Were home health services ordered? no If so, what is the name of the agency? N/a  Has the  agency set up a time to come to the patient's home? not applicable Were any new equipment or medical supplies ordered?  No What is the name of the medical supply agency? N/a Were you able to get the supplies/equipment? not applicable Do you have any questions related to the use of the equipment or supplies? No  Functional Questionnaire: (I = Independent and D = Dependent) ADLs: has cane for ambulation. Independent with personal care.  She [facility]ends HD: [facility]   Follow up appointments reviewed:  PCP Hospital f/u appt confirmed? Yes  Scheduled to see Genice Rouge- 07/05/2022.  [facility] f/u appt confirmed? Yes  Scheduled to see cardiology - 07/31/2022.  Are transportation arrangements needed? No  If their condition worsens, is the pt aware to call PCP or go to the Emergency Dept.? Yes Was the patient provided with contact information for the PCP's office or ED? Yes Was to pt encouraged to call back with questions or concerns? Yes

## 2022-06-28 NOTE — ED Notes (Signed)
Pt was able to follow directions and respond to crew as she was speaking non coherently. Pt was also tracking and able to ambulate unassisted. Pt was taken down to CT/ non code stroke.

## 2022-06-28 NOTE — ED Provider Notes (Signed)
[facility] EMERGENCY DEPARTMENT Provider Note   CSN: [id number] Arrival date & time: 06/28/22  1755     History  Chief Complaint  Patient presents with   Altered Mental Status    [name] is a 66 y.o. female.  The history is provided by the patient and medical records. No language interpreter was used.  Altered Mental Status Presenting symptoms: behavior changes (speech abnormality)   Presenting symptoms: no confusion   Severity:  Moderate Most recent episode:  Today Episode history:  Continuous Duration:  2 hours Timing:  Constant Progression:  Unchanged Chronicity:  Recurrent Associated symptoms: no abdominal pain, no agitation, no depression, no difficulty breathing, no fever (no fever here but pt reports recent fever), no hallucinations, no light-headedness, no nausea, no palpitations, no rash, no seizures, no visual change, no vomiting and no weakness  Slurred speech: garbled babbling speech pattern.      Home Medications Prior to Admission medications   Medication Sig Start Date End Date Taking? Authorizing Provider  acetaminophen (TYLENOL) 325 MG tablet Take 2 tablets (650 mg total) by mouth every 6 (six) hours as needed for mild pain or moderate pain. 12/07/21   Arrien, Jimmy Picket, MD  albuterol (VENTOLIN HFA) 108 (90 Base) MCG/ACT inhaler INHALE 1-2 PUFFS BY MOUTH EVERY 6 HOURS AS NEEDED FOR WHEEZE OR SHORTNESS OF BREATH 04/12/22   [name], NP  amiodarone (PACERONE) 200 MG tablet TAKE 1 TABLET BY MOUTH TWICE A DAY 04/25/22   Bensimhon, Shaune Pascal, MD  apixaban (ELIQUIS) 5 MG TABS tablet TAKE ONE TABLET BY MOUTH TWICE DAILY at [place] Patient taking differently: Take 5 mg by mouth in the morning and at bedtime. 02/24/22   Shelly Coss, MD  benzonatate (TESSALON PERLES) 100 MG capsule Take 1 capsule (100 mg total) by mouth 3 (three) times daily as needed for cough. 05/23/22   [name], NP  Blood Glucose Monitoring Suppl  (ACCU-CHEK GUIDE) w/Device KIT Use to check blood sugar TID. Dx: E11.69 06/28/22   Charlott Rakes, MD  busPIRone (BUSPAR) 5 MG tablet Take 1 tablet (5 mg total) by mouth 2 (two) times daily. 05/14/22   [name], NP  colchicine 0.6 MG tablet TAKE 0.5 TABLETS BY MOUTH DAILY. 04/03/22   Charlott Rakes, MD  diphenhydrAMINE (BENADRYL) 25 mg capsule Take 25 mg by mouth every 6 (six) hours as needed for allergies or itching.    [provider]  doxycycline (VIBRAMYCIN) 100 MG capsule Take 1 capsule (100 mg total) by mouth 2 (two) times daily. 06/25/22   Thurnell Lose, MD  Dulaglutide (TRULICITY) 1.5 DV/7.6HY SOPN Inject 1.5 mg into the skin once a week. 06/28/22   Charlott Rakes, MD  DULoxetine (CYMBALTA) 60 MG capsule Take 1 capsule (60 mg total) by mouth daily. 05/08/22   [name], NP  EPINEPHrine 0.3 mg/0.3 mL IJ SOAJ injection Inject 0.3 mg into the muscle as needed for anaphylaxis. 06/25/20   [name], NP  fluticasone (FLONASE) 50 MCG/ACT nasal spray Place 2 sprays into both nostrils daily as needed for allergies or rhinitis. 04/25/22   [name], NP  glucose blood (ACCU-CHEK GUIDE) test strip USE TO CHECK BLOOD SUGAR 3 TIMES DAILY 04/03/22   Charlott Rakes, MD  guaiFENesin 200 MG tablet Take 1 tablet (200 mg total) by mouth every 4 (four) hours as needed for cough or to loosen phlegm. Patient not taking: Reported on 06/20/2022 05/23/22   Oletta Lamas,  Milford Cage, NP  insulin aspart (NOVOLOG FLEXPEN) 100 UNIT/ML FlexPen Inject 2-10 Units into the skin 3 (three) times daily before meals. Per sliding scale 06/28/22   Charlott Rakes, MD  isosorbide mononitrate (IMDUR) 30 MG 24 hr tablet Take 0.5 tablets (15 mg total) by mouth daily. 03/18/22 06/20/22  Arrien, Jimmy Picket, MD  KLOR-CON M20 20 MEQ tablet Take 20 mEq by mouth 2 (two) times daily. 04/18/22   [provider]  lidocaine-prilocaine (EMLA) cream SMARTSIG:sparingly Topical 3 Times a Week  06/01/22   [provider]  LINZESS 72 MCG capsule TAKE 1 CAPSULE (72 MCG TOTAL) BY MOUTH DAILY BEFORE BREAKFAST. IF BM NOT ACHIEVE WITH 1 CAPSULE SHE MAY TAKE 2 DAILY. STOP IF DIARRHEA OCCURS 04/25/22   [name], NP  loratadine (CLARITIN) 10 MG tablet Take 1 tablet (10 mg total) by mouth daily. 12/29/21   [name], NP  metolazone (ZAROXOLYN) 2.5 MG tablet Take 2.5 mg by mouth 2 (two) times a week. 04/20/22   [provider]  metoprolol succinate (TOPROL-XL) 25 MG 24 hr tablet Take 1 tablet (25 mg total) by mouth daily. 03/18/22 06/20/22  Arrien, Jimmy Picket, MD  Multiple Vitamins-Minerals (MULTIVITAMIN WOMEN PO) Take 1 tablet by mouth daily.    [provider]  multivitamin (RENA-VIT) TABS tablet Take 1 tablet by mouth daily. 04/27/22   [provider]  nitroGLYCERIN (NITROSTAT) 0.4 MG SL tablet Place 1 tablet (0.4 mg total) under the tongue every 5 (five) minutes x 3 doses as needed for chest pain. 01/08/14   Charolette Forward, MD  pantoprazole (PROTONIX) 40 MG tablet TAKE ONE TABLET BY MOUTH DAILY AT 9AM 04/17/22   [name], NP  polyethylene glycol powder (GLYCOLAX/MIRALAX) 17 GM/SCOOP powder Take 17 g by mouth daily. Patient taking differently: Take 17 g by mouth daily as needed for mild constipation. 02/25/22   Shelly Coss, MD  pregabalin (LYRICA) 100 MG capsule TAKE 1 CAPSULE BY MOUTH THREE TIMES A DAY Patient taking differently: Take 100 mg by mouth 3 (three) times daily as needed (for pain). 02/26/22   [name], NP  RESTASIS 0.05 % ophthalmic emulsion Place 1 drop into both eyes in the morning and at bedtime. 11/19/17   [provider]  rosuvastatin (CRESTOR) 10 MG tablet TAKE ONE TABLET BY MOUTH DAILY AT 5PM 05/18/22   Joette Catching, PA-C  senna-docusate (SENOKOT-S) 8.6-50 MG tablet Take 1 tablet by mouth 2 (two) times daily. 02/24/22   Shelly Coss, MD  torsemide (DEMADEX) 100 MG tablet Take 1 tablet by  mouth. Monday Wednesday and Friday 04/02/22   [provider]      Allergies    Bee pollen, Bee venom, and Sulfa antibiotics    Review of Systems   Review of Systems  Constitutional:  Negative for chills, diaphoresis, fatigue and fever (no fever here but pt reports recent fever).  HENT:  Negative for congestion.   Respiratory:  Negative for cough, chest tightness, shortness of breath and wheezing.   Cardiovascular:  Negative for palpitations.  Gastrointestinal:  Negative for abdominal pain, constipation, diarrhea, nausea and vomiting.  Genitourinary:  Negative for decreased urine volume, dysuria and flank pain.  Musculoskeletal:  Negative for back pain, neck pain and neck stiffness.  Skin:  Negative for rash and wound.  Neurological:  Negative for dizziness, seizures, weakness, light-headedness and numbness.  Psychiatric/Behavioral:  Negative for agitation, confusion and hallucinations.   All other systems reviewed and are negative.  Physical Exam Updated Vital Signs BP 115/60 (BP Location: Right Arm)   Pulse 86   Temp 99.6 F (37.6 C) (Oral)   Resp 20   Ht _0  (1.702 m)   Wt 127.9 kg   SpO2 94%   BMI 44.17 kg/m  Physical Exam Vitals and nursing note reviewed.  Constitutional:      General: She is not in acute distress.    Appearance: She is well-developed. She is not ill-appearing, toxic-appearing or diaphoretic.  HENT:     Head: Normocephalic and atraumatic.     Right Ear: External ear normal.     Left Ear: External ear normal.     Nose: No congestion or rhinorrhea.     Mouth/Throat:     Mouth: Mucous membranes are moist.     Pharynx: No oropharyngeal exudate.  Eyes:     Extraocular Movements: Extraocular movements intact.     Conjunctiva/sclera: Conjunctivae normal.     Pupils: Pupils are equal, round, and reactive to light.  Neck:     Vascular: No carotid bruit.  Cardiovascular:     Rate and Rhythm: Normal rate.     Heart sounds: No murmur  heard. Pulmonary:     Effort: No respiratory distress.     Breath sounds: No stridor. No wheezing, rhonchi or rales.  Chest:     Chest wall: No tenderness.  Abdominal:     General: Abdomen is flat. There is no distension.     Tenderness: There is no abdominal tenderness. There is no right CVA tenderness, left CVA tenderness, guarding or rebound.  Musculoskeletal:        General: No tenderness.     Cervical back: Normal range of motion and neck supple. No tenderness.     Right lower leg: No edema.     Left lower leg: No edema.  Skin:    General: Skin is warm.     Coloration: Skin is not pale.     Findings: No erythema or rash.  Neurological:     Mental Status: She is alert and oriented to person, place, and time.     Cranial Nerves: No facial asymmetry.     Sensory: No sensory deficit.     Motor: No weakness, tremor, abnormal muscle tone or seizure activity.     Coordination: Coordination normal.     Deep Tendon Reflexes: Reflexes are normal and symmetric.     Comments: Patient has babbling speech that per neurology is similar to prior.  No aphasia as she is able to say some words out.  It is not slurred or dysarthric and is clear when she says certain words.  No other focal neurologic deficits.  Patient reporting diffuse body soreness and aches as well as some mild headache.  No neck pain or neck stiffness.  Psychiatric:        Mood and Affect: Mood normal.     ED Results / Procedures / Treatments   Labs (all labs ordered are listed, but only abnormal results are displayed) Labs Reviewed  PROTIME-INR - Abnormal; Notable for the following components:      Result Value   Prothrombin Time 22.3 (*)    INR 2.0 (*)    All other components within normal limits  CBC - Abnormal; Notable for the following components:   RBC 3.42 (*)    Hemoglobin 10.7 (*)    HCT 32.8 (*)    All other components within normal limits  DIFFERENTIAL - Abnormal; Notable  for the following components:    Monocytes Absolute 1.4 (*)    All other components within normal limits  COMPREHENSIVE METABOLIC PANEL - Abnormal; Notable for the following components:   BUN 34 (*)    Creatinine, Ser 8.08 (*)    Total Protein 8.3 (*)    GFR, Estimated 5 (*)    All other components within normal limits  I-STAT CHEM 8, ED - Abnormal; Notable for the following components:   BUN 36 (*)    Creatinine, Ser 8.80 (*)    Calcium, Ion 1.07 (*)    Hemoglobin 11.2 (*)    HCT 33.0 (*)    All other components within normal limits  RESP PANEL BY RT-PCR (FLU A&B, COVID) ARPGX2  CULTURE, BLOOD (ROUTINE X 2)  CULTURE, BLOOD (ROUTINE X 2)  ETHANOL  RAPID URINE DRUG SCREEN, HOSP PERFORMED  URINALYSIS, ROUTINE W REFLEX MICROSCOPIC  TSH  AMMONIA    EKG EKG Interpretation  Date/Time:  Wednesday June 28 2022 18:10:03 EST Ventricular Rate:  84 PR Interval:    QRS Duration: 96 QT Interval:  428 QTC Calculation: 505 R Axis:   142 Text Interpretation: Accelerated Junctional rhythm Possible Right ventricular hypertrophy Possible Inferior infarct , age undetermined Cannot rule out Anterior infarct , age undetermined Abnormal ECG When compared with ECG of 20-Jun-2022 21:18, PREVIOUS ECG IS PRESENT when comapred to prior, similar appearnce. NO STEMI Confirmed by Antony Blackbird [id number]) on 06/28/2022 6:19:56 PM  Radiology MR BRAIN WO CONTRAST  Result Date: 06/28/2022 CLINICAL DATA:  Stroke suspected; headache and stuttering EXAM: MRI HEAD WITHOUT CONTRAST TECHNIQUE: Multiplanar, multiecho pulse sequences of the brain and surrounding structures were obtained without intravenous contrast. COMPARISON:  06/10/2022 MRI head, correlation is also made with 06/28/2022 CT head FINDINGS: Brain: No restricted diffusion to suggest acute or subacute infarct. No acute hemorrhage, mass, mass effect, or midline shift. Normal pituitary and craniocervical junction. No hydrocephalus or extra-axial collection. Scattered T2 hyperintense  signal in the periventricular white matter, likely the sequela of minimal chronic small vessel ischemic disease. Vascular: Normal arterial flow voids. Skull and upper cervical spine: Hyperostosis frontalis. Otherwise normal marrow signal. Sinuses/Orbits: Clear paranasal sinuses. Status post bilateral lens replacements. Other: Trace fluid in the mastoid air cells. IMPRESSION: No acute intracranial process. No evidence of acute or subacute infarct. Electronically Signed   By: Merilyn Baba M.D.   On: 06/28/2022 23:14   DG Chest 2 View  Result Date: 06/28/2022 CLINICAL DATA:  Recently diagnosed with pneumonia on antibiotics; new expressive aphasia and tremors EXAM: CHEST - 2 VIEW COMPARISON:  Radiographs 06/21/2022 FINDINGS: Cardiomegaly. Left atrial appendage occlusion device. Sternotomy. Right IJ dual lumen catheter with tip in the right atrium. Mitral annuloplasty. Aortic atherosclerotic calcification. Left basilar airspace opacities likely representing atelectasis similar to 06/21/2022. No definite pleural effusion or pneumothorax. No acute osseous abnormality. IMPRESSION: Stable exam since 06/21/2022.  Presumed left basilar atelectasis. Electronically Signed   By: Placido Sou M.D.   On: 06/28/2022 19:42   CT HEAD WO CONTRAST (5MM)  Result Date: 06/28/2022 CLINICAL DATA:  Delirium EXAM: CT HEAD WITHOUT CONTRAST TECHNIQUE: Contiguous axial images were obtained from the base of the skull through the vertex without intravenous contrast. RADIATION DOSE REDUCTION: This exam was performed according to the departmental dose-optimization program which includes automated exposure control, adjustment of the mA and/or kV according to patient size and/or use of iterative reconstruction technique. COMPARISON:  02/01/2022 FINDINGS: Motion degraded images. Brain: No evidence of acute infarction, hemorrhage, hydrocephalus, extra-axial  collection or mass lesion/mass effect. Vascular: No hyperdense vessel or unexpected  calcification. Skull: Normal. Negative for fracture or focal lesion. Sinuses/Orbits: The visualized paranasal sinuses are essentially clear. The mastoid air cells are unopacified. Other: None. IMPRESSION: Motion degraded images. Normal head CT. Electronically Signed   By: Julian Hy M.D.   On: 06/28/2022 19:05    Procedures Procedures    Medications Ordered in ED Medications - No data to display  ED Course/ Medical Decision Making/ A&P                           Medical Decision Making Amount and/or Complexity of Data Reviewed Labs: ordered. Radiology: ordered.    SHARAY BELLISSIMO is a 66 y.o. female with a past medical history significant for diabetes, atrial fibrillation on Eliquis therapy, previous DVT, COPD, sleep apnea, GERD, asthma, previous stuttering speech abnormality intermittently, ESRD on dialysis, chest, and recent admission for viral and bacterial pneumonia who presents with speech abnormality.  According to discussion with patient, she had onset of the speech between 3 and 4 PM.  She is babbling/stuttering and is not able to get words out well.  She reports that she had a fever over the last few days similar to when she had a recent pneumonia.  She says she is not on antibiotics but is continued to have some cough and URI symptoms.  She is reporting diffuse body aches including some headache.  He is not reporting neck pain or stiffness to me.  On my exam initially, lungs are coarse bilaterally.  Chest was nontender.  Abdomen was nontender.  She was moving all extremities with symmetric strength.  Intact sensation throughout.  Patient's speech was very babbling and difficult to understand but intermittently would have normal words.  As her symptoms reportedly began within the last 3 hours, I quickly had neurology come to the bedside to see her to determine if she meets code stroke activation criteria.  Dr. Ludwig Lean saw the patient and immediately remembered her from earlier  this year when she had the same presentation.  He does not feel this is a stroke and does not want [place] to activate code stroke.  He does agree with getting head CT and an MRI brain without contrast and continue the rest of her work-up.  He also does not feel she needs lumbar puncture as this seems to be a functional neurologic deficit similar to what she is had in the past.  We will thus start rest of work-up to look for recurrent pneumonia, electrolyte abnormalities, and will also make sure there is nothing neurologic going on.  Anticipate reassessment after work-up and discussion with neurology about disposition.  Spoke again to neurology who agrees with the MRI.  If MRI is negative they do not think this patient needs further neurologic work-up at this time.  Work-up continues to return.  COVID and flu negative.  Metabolic panel appears similar to prior with elevated creatinine with her dialysis use.  No leukocytosis and mild anemia is present.  Chest x-ray unchanged from prior with no acute pneumonia.    MRI returned without any acute stroke.  Labs appear similar to prior overall.  She is due for dialysis tomorrow.  She reports she does not make much urine and does not want to wait for urinalysis so we will cancel that order.  Given her reassuring work-up and otherwise well appearance, I do feel she is safe for discharge  home with a recommendation of neurology.  She will follow-up with outpatient neurology and patient agrees.  She understands return precautions and follow-up instructions and was discharged in good condition with improving speech abnormality.        Final Clinical Impression(s) / ED Diagnoses Final diagnoses:  Speech disturbance, unspecified type    Rx / DC Orders ED Discharge Orders          Ordered    Ambulatory referral to Neurology       Comments: An appointment is requested in approximately: 2 weeks   06/28/22 2339            Clinical Impression: 1.  Speech disturbance, unspecified type     Disposition: Discharge  Condition: Good  I have discussed the results, Dx and Tx plan with the pt(& family if present). He/she/they expressed understanding and agree(s) with the plan. Discharge instructions discussed at great length. Strict return precautions discussed and pt &/or family have verbalized understanding of the instructions. No further questions at time of discharge.    New Prescriptions   No medications on file    Follow Up: [name], NP [id number] [address] [phone]     [place] [address]     [address] [place] [address] [facility] [address] [place] [address] [phone] Schedule an appointment as soon as possible for a visit       Kiasha Bellin, Gwenyth Allegra, MD 06/28/22 2345

## 2022-06-28 NOTE — ED Notes (Signed)
Pt transported to MRI 

## 2022-06-28 NOTE — ED Triage Notes (Signed)
The pt arrived by [facility] from home  the pt was discharged from [facility] Sunday with a dx of pneumonia she is on antibiotics c/o a headache today speech is garbled  for approximately 1600 ems reported  the pt follows commands  and her only problem was stuttering which she does intermittently

## 2022-06-28 NOTE — Telephone Encounter (Signed)
Rx sent for insulin and GM. I also sent Trulicity. Sometimes patients mistake Trulicity for insulin. I went ahead and sent both just to be safe. Will have to defer rx for doxy to [facility].

## 2022-06-28 NOTE — ED Triage Notes (Signed)
Recently dx with PNA and on antibiotics patient started having expressive aphasia and full body tremors Patient is able to follow commands but hard to make out what she is saying.  Patient is febrile

## 2022-06-29 NOTE — Telephone Encounter (Signed)
Spoke with patient yesterday late entry - having chest pain, slurred speech advise family call 911 not to transport her

## 2022-07-03 ENCOUNTER — Ambulatory Visit: Payer: Medicare Other | Attending: Primary Care

## 2022-07-03 ENCOUNTER — Other Ambulatory Visit: Payer: Self-pay

## 2022-07-03 DIAGNOSIS — M6281 Muscle weakness (generalized): Secondary | ICD-10-CM | POA: Insufficient documentation

## 2022-07-03 DIAGNOSIS — R5381 Other malaise: Secondary | ICD-10-CM | POA: Insufficient documentation

## 2022-07-03 DIAGNOSIS — R2681 Unsteadiness on feet: Secondary | ICD-10-CM | POA: Insufficient documentation

## 2022-07-03 DIAGNOSIS — R2689 Other abnormalities of gait and mobility: Secondary | ICD-10-CM | POA: Insufficient documentation

## 2022-07-03 LAB — CULTURE, BLOOD (ROUTINE X 2)
Culture: NO GROWTH
Special Requests: ADEQUATE

## 2022-07-03 NOTE — Therapy (Signed)
OUTPATIENT PHYSICAL THERAPY LOWER EXTREMITY EVALUATION   Patient Name: [name] MRN: [id number] DOB:1955-09-26, 66 y.o., female Today's Date: 07/03/2022   PT End of Session - 07/03/22 1014     Visit Number 1    Number of Visits 13    Date for PT Re-Evaluation 08/14/22    Authorization Type [employer] Medicare    PT Start Time 0745    PT Stop Time 0825    PT Time Calculation (min) 40 min    Activity Tolerance Patient tolerated treatment well             Past Medical History:  Diagnosis Date   Acute on chronic systolic CHF (congestive heart failure) ([place]) 12/21/2020   Allergic rhinitis    Arthritis    Asthma    Chronic diastolic CHF (congestive heart failure) (HCC)    COPD (chronic obstructive pulmonary disease) ([place])    Depression    DM (diabetes mellitus) ([place])    DVT (deep vein thrombosis) in pregnancy    GERD (gastroesophageal reflux disease)    HTN (hypertension)    Hyperlipidemia    Obesity    Pneumonia 04/2018   RIGHT LOBE   Sleep apnea    compliant with CPAP   Past Surgical History:  Procedure Laterality Date   ABDOMINAL HYSTERECTOMY     AV FISTULA PLACEMENT Left 03/17/2022   Procedure: LEFT ARM ARTERIOVENOUS (AV) FISTULA CREATION;  Surgeon: Angelia Mould, MD;  Location: [place];  Service: Vascular;  Laterality: Left;   BUBBLE STUDY  11/26/2020   Procedure: BUBBLE STUDY;  Surgeon: Buford Dresser, MD;  Location: [place];  Service: Cardiovascular;;   CARDIOVERSION N/A 05/06/2020   Procedure: CARDIOVERSION;  Surgeon: Buford Dresser, MD;  Location: [place];  Service: Cardiovascular;  Laterality: N/A;   CARDIOVERSION N/A 11/04/2020   Procedure: CARDIOVERSION;  Surgeon: Lelon Perla, MD;  Location: [place];  Service: Cardiovascular;  Laterality: N/A;   CARDIOVERSION N/A 02/01/2021   Procedure: CARDIOVERSION;  Surgeon: Jolaine Artist, MD;  Location: [place];  Service: Cardiovascular;  Laterality: N/A;    CARDIOVERSION N/A 02/09/2021   Procedure: CARDIOVERSION;  Surgeon: Jolaine Artist, MD;  Location: [place];  Service: Cardiovascular;  Laterality: N/A;   CARDIOVERSION N/A 04/19/2021   Procedure: CARDIOVERSION;  Surgeon: Fay Records, MD;  Location: [facility];  Service: Cardiovascular;  Laterality: N/A;   CARDIOVERSION N/A 11/25/2021   Procedure: CARDIOVERSION;  Surgeon: Jolaine Artist, MD;  Location: [place];  Service: Cardiovascular;  Laterality: N/A;   CARDIOVERSION N/A 01/31/2022   Procedure: CARDIOVERSION;  Surgeon: Jolaine Artist, MD;  Location: [place];  Service: Cardiovascular;  Laterality: N/A;   CATARACT EXTRACTION, BILATERAL     CHOLECYSTECTOMY     COLONOSCOPY WITH PROPOFOL N/A 03/05/2015   Procedure: COLONOSCOPY WITH PROPOFOL;  Surgeon: Carol Ada, MD;  Location: [facility];  Service: Endoscopy;  Laterality: N/A;   DIAGNOSTIC LAPAROSCOPY     FISTULA SUPERFICIALIZATION Left 03/17/2022   Procedure: FISTULA SUPERFICIALIZATION -LEFT;  Surgeon: Angelia Mould, MD;  Location: [facility] OR;  Service: Vascular;  Laterality: Left;   ingrown hallux Left    IR FLUORO GUIDE CV LINE RIGHT  03/14/2022   IR US GUIDE VASC ACCESS RIGHT  03/14/2022   KNEE SURGERY     LEFT HEART CATH AND CORONARY ANGIOGRAPHY N/A 12/31/2017   Procedure: LEFT HEART CATH AND CORONARY ANGIOGRAPHY;  Surgeon: Charolette Forward, MD;  Location: [place] [facility];  Service: Cardiovascular;  Laterality: N/A;  MAZE N/A 12/29/2020   Procedure: MAZE;  Surgeon: [name], MD;  Location: [place];  Service: Open Heart Surgery;  Laterality: N/A;   MITRAL VALVE REPAIR N/A 12/29/2020   Procedure: MITRAL VALVE REPAIR USING CARBOMEDICS ANNULOFLEX RING SIZE 30;  Surgeon: [name], MD;  Location: [place];  Service: Open Heart Surgery;  Laterality: N/A;   RIGHT HEART CATH N/A 12/22/2020   Procedure: RIGHT HEART CATH;  Surgeon: Larey Dresser, MD;  Location: [place] [facility];  Service:  Cardiovascular;  Laterality: N/A;   RIGHT HEART CATH N/A 01/31/2021   Procedure: RIGHT HEART CATH;  Surgeon: Jolaine Artist, MD;  Location: [place] [facility];  Service: Cardiovascular;  Laterality: N/A;   RIGHT HEART CATH N/A 07/29/2021   Procedure: RIGHT HEART CATH;  Surgeon: Jolaine Artist, MD;  Location: [place] [facility];  Service: Cardiovascular;  Laterality: N/A;   RIGHT HEART CATH N/A 11/24/2021   Procedure: RIGHT HEART CATH;  Surgeon: Jolaine Artist, MD;  Location: [place] [facility];  Service: Cardiovascular;  Laterality: N/A;   RIGHT HEART CATH N/A 02/14/2022   Procedure: RIGHT HEART CATH;  Surgeon: Jolaine Artist, MD;  Location: [place] [facility];  Service: Cardiovascular;  Laterality: N/A;   RIGHT HEART CATH N/A 02/22/2022   Procedure: RIGHT HEART CATH;  Surgeon: Larey Dresser, MD;  Location: [place] [facility];  Service: Cardiovascular;  Laterality: N/A;   TEE WITHOUT CARDIOVERSION N/A 11/26/2020   Procedure: TRANSESOPHAGEAL ECHOCARDIOGRAM (TEE);  Surgeon: Buford Dresser, MD;  Location: [facility]COPY;  Service: Cardiovascular;  Laterality: N/A;   TEE WITHOUT CARDIOVERSION N/A 12/29/2020   Procedure: TRANSESOPHAGEAL ECHOCARDIOGRAM (TEE);  Surgeon: [name], MD;  Location: [place];  Service: Open Heart Surgery;  Laterality: N/A;   TEE WITHOUT CARDIOVERSION N/A 01/20/2022   Procedure: TRANSESOPHAGEAL ECHOCARDIOGRAM (TEE);  Surgeon: Jolaine Artist, MD;  Location: [place];  Service: Cardiovascular;  Laterality: N/A;   TRICUSPID VALVE REPLACEMENT N/A 12/29/2020   Procedure: 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 RING SIZE 34;  Surgeon: [name], MD;  Location: [place];  Service: Open Heart Surgery;  Laterality: N/A;   Patient Active Problem List   Diagnosis Date Noted   HCAP (healthcare-associated pneumonia) 06/21/2022   ESRD on dialysis ([place]) 06/21/2022   OSA on CPAP 06/21/2022   Acute respiratory failure with hypoxia  ([place])    Acute hypoxic respiratory failure ([place]) 06/20/2022   Acute kidney injury superimposed on chronic kidney disease ([place]) 03/18/2022   Class 3 obesity ([place]) 03/18/2022   Depression 01/21/2022   Chronic deep vein thrombosis (DVT) of other vein of right upper extremity ([place]) 12/29/2021   Abnormal weight loss 11/16/2021   Personal history of colonic polyps 11/16/2021   Rib pain 11/16/2021   Anxiety and depression 08/26/2021   Essential hypertension 08/26/2021   GERD (gastroesophageal reflux disease) 08/26/2021   COPD (chronic obstructive pulmonary disease) ([place]) 07/29/2021   Biventricular heart failure ([place])    Atrial flutter ([place])    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   Encounter for preoperative dental examination    Teeth missing    Gingivitis    Accretions on teeth    Atrial fibrillation, permanent ([place]) 11/02/2020   Acute on chronic right heart failure ([place]) 05/06/2020   NICM (nonischemic cardiomyopathy) ([place]) 04/01/2020   Family history of heart disease 04/01/2020   Mitral regurgitation 04/01/2020   Acute on chronic  left systolic heart failure ([place]) 07/01/2018   Acute on chronic diastolic CHF (congestive heart failure) ([place]) 07/01/2018   Chronic atrial fibrillation ([place]) 47/42/5956   Diastolic dysfunction 38/75/6433   Diabetes mellitus type 2 in obese ([place]) 12/26/2017   Hypoxia 09/21/2011   Type 2 diabetes mellitus with hyperlipidemia ([place]) 02/04/2010   AF (paroxysmal atrial fibrillation) ([place]) 02/04/2010   Gastroparesis 02/04/2010    PCP: [name], NP  REFERRING PROVIDER: Thurnell Lose, MD  REFERRING DIAG: R53.81 (ICD-10-CM) - Physical deconditioning    THERAPY DIAG:  Muscle weakness (generalized) - Plan: PT plan of care cert/re-cert  Other abnormalities of gait and mobility - Plan: PT plan of care cert/re-cert  Unsteadiness on feet - Plan: PT plan of care cert/re-cert  Rationale for Evaluation and  Treatment: Rehabilitation  ONSET DATE: Chronic  SUBJECTIVE:   SUBJECTIVE STATEMENT: Pt presents to PT with reports of recent decline in status/weakness. Feels like her LE will just give out on her when she is walking. Has had recent speech change and felt like she "went limp" and had to be helped inside her house. Recent imaging showed no CVA, did have recent bout of pneumonia which caused roughly two week hospitalization. Has had two falls and many close calls over last few months. Notes increase in hand tremors and discoordination of UE, has upcoming initial visit scheduled with neurology.    PERTINENT HISTORY: HTN, Depression, DM II, Gastroparesis CHF PAIN:  Are you having pain? Yes: NPRS scale: 5/10 Pain location: lower back Pain description: dull ache Aggravating factors: prolonged sitting Relieving factors: heat  PRECAUTIONS: Fall  WEIGHT BEARING RESTRICTIONS: No  FALLS:  Has patient fallen in last 6 months? Yes. Number of falls: two  LIVING ENVIRONMENT: Lives with: lives with their family Lives in: House/apartment  OCCUPATION: Not working  PLOF: Independent and Independent with basic ADLs  PATIENT GOALS: improve strength and balance  NEXT MD VISIT:   OBJECTIVE:   DIAGNOSTIC FINDINGS: See imaging for recent CT and MRI  PATIENT SURVEYS:  FOTO: 45% function; 55% predicted  COGNITION: Overall cognitive status: Within functional limits for tasks assessed     SENSATION: WFL  EDEMA:  DNT  POSTURE:  large body habitus, rounded shoulders  PALPATION: Slight TTP to lumbar paraspinals  LOWER EXTREMITY MMT:  MMT Right eval Left eval  Hip flexion 3+/5 3+/5  Hip extension    Hip abduction 3+/5 3+/5  Hip adduction 3+/5 3+/5  Hip internal rotation    Hip external rotation    Knee flexion    Knee extension    Ankle dorsiflexion    Ankle plantarflexion    Ankle inversion    Ankle eversion     (Blank rows = not tested)  LOWER EXTREMITY SPECIAL TESTS:   DNT  FUNCTIONAL TESTS:  5 times sit to stand: 20 Timed up and go (TUG): 20 with SPC  GAIT: Distance walked: 8f Assistive device utilized: Single point cane Level of assistance: Modified independence Comments: slowed gait, wide BoS  [facility] Adult PT Treatment:                                                DATE: 07/03/2022 Therapeutic Exercise: Sit to Stand 2x10 Seated ball squeeze 2x10 - 5" hold Seated clamshell 2x15 - Blue Theraband Seated march 2x20 - Blue Theraband  PATIENT EDUCATION:  Education  details: eval findings, FOTO, HEP, POC Person educated: Patient Education method: Explanation, Demonstration, and Handouts Education comprehension: verbalized understanding and returned demonstration  HOME EXERCISE PROGRAM: Sit to Stand 2x10 - 2x/day Seated ball squeeze 2x10 - 5" hold  - 2x/day Seated clamshell 2x15 - Blue Theraband  - 2x/day Seated march 2x20 - Blue Theraband - 2x/day  ASSESSMENT:  CLINICAL IMPRESSION: Patient is a [name] who was seen today for physical therapy evaluation and treatment for recent decline in status and physical debility. Physical findings are consistent with referring provider impression, as pt demonstrates decrease in LE strength and reduced functional mobility assessed via TUG and Five Time Sit to Stand. Her FOTO shows reduced subjective functional ability and balance below PLOF, indicating she would benefit from skilled PT working on improving strength, balance, and safety.  Progress may be complicated by recent increase in coordination deficits and upcoming neurology visit.  OBJECTIVE IMPAIRMENTS: Abnormal gait, decreased activity tolerance, decreased balance, decreased endurance, decreased mobility, difficulty walking, decreased strength, and pain.   ACTIVITY LIMITATIONS: carrying, lifting, standing, squatting, stairs, transfers, bed mobility, bathing, and dressing  PARTICIPATION LIMITATIONS: meal prep, cleaning, driving, shopping,  community activity, and yard work  PERSONAL FACTORS: Fitness and 3+ comorbidities: HTN, Depression, DM II, Gastroparesis CHF  are also affecting patient's functional outcome.   REHAB POTENTIAL: Good  CLINICAL DECISION MAKING: Evolving/moderate complexity  EVALUATION COMPLEXITY: Moderate   GOALS: Goals reviewed with patient? No  SHORT TERM GOALS: Target date: 07/24/2022  Pt will be compliant and knowledgeable with initial HEP for improved comfort and carryover Baseline: initial HEP given  Goal status: INITIAL  LONG TERM GOALS: Target date: 08/14/2022   Pt will improve FOTO function score to no less than 55% as proxy for functional improvement Baseline: 45% function Goal status: INITIAL   2.  Pt will decrease Five Times Sit to Stand to no greater than 14 seconds for improved balance and functional mobility Baseline: 20 seconds Goal status: INITIAL  3.  Pt will decrease TUG with LRAD to no greater than 14 seconds for improved balance and functional mobility Baseline: 20 seconds with SPC Goal status: INITIAL  4.  Pt will increase all LE MMT to no less than 4/5 for improved strength and function Baseline: see cahrt Goal status: INITIAL  PLAN:  PT FREQUENCY: 1-2x/week  PT DURATION: 6 weeks  PLANNED INTERVENTIONS: Therapeutic exercises, Therapeutic activity, Neuromuscular re-education, Balance training, Gait training, Patient/Family education, Self Care, Joint mobilization, Aquatic Therapy, Electrical stimulation, Manual therapy, and Re-evaluation  PLAN FOR NEXT SESSION: assess HEP response, supine strengthening, progress as able   Ward Chatters, PT 07/03/2022, 10:17 AM

## 2022-07-05 ENCOUNTER — Ambulatory Visit (INDEPENDENT_AMBULATORY_CARE_PROVIDER_SITE_OTHER): Payer: Medicare Other | Admitting: Primary Care

## 2022-07-05 ENCOUNTER — Encounter (INDEPENDENT_AMBULATORY_CARE_PROVIDER_SITE_OTHER): Payer: Self-pay | Admitting: Primary Care

## 2022-07-05 VITALS — BP 127/85 | HR 93 | Resp 16 | Wt 290.6 lb

## 2022-07-05 DIAGNOSIS — R058 Other specified cough: Secondary | ICD-10-CM

## 2022-07-05 DIAGNOSIS — J0110 Acute frontal sinusitis, unspecified: Secondary | ICD-10-CM

## 2022-07-05 DIAGNOSIS — K5900 Constipation, unspecified: Secondary | ICD-10-CM | POA: Diagnosis not present

## 2022-07-05 DIAGNOSIS — F32A Depression, unspecified: Secondary | ICD-10-CM

## 2022-07-05 DIAGNOSIS — E1142 Type 2 diabetes mellitus with diabetic polyneuropathy: Secondary | ICD-10-CM

## 2022-07-05 DIAGNOSIS — Z760 Encounter for issue of repeat prescription: Secondary | ICD-10-CM | POA: Diagnosis not present

## 2022-07-05 DIAGNOSIS — E1169 Type 2 diabetes mellitus with other specified complication: Secondary | ICD-10-CM

## 2022-07-05 DIAGNOSIS — E669 Obesity, unspecified: Secondary | ICD-10-CM

## 2022-07-05 DIAGNOSIS — F4323 Adjustment disorder with mixed anxiety and depressed mood: Secondary | ICD-10-CM

## 2022-07-05 MED ORDER — FLUTICASONE PROPIONATE 50 MCG/ACT NA SUSP: 2.0000 | Freq: Every day | NASAL | 3 refills | Status: DC | PRN

## 2022-07-05 MED ORDER — DULOXETINE HCL 60 MG PO CPEP: 60.0000 mg | ORAL_CAPSULE | Freq: Every day | ORAL | 1 refills | Status: DC

## 2022-07-05 MED ORDER — LORATADINE 10 MG PO TABS: 10.0000 mg | ORAL_TABLET | Freq: Every day | ORAL | 11 refills | Status: DC

## 2022-07-05 MED ORDER — TRULICITY 1.5 MG/0.5ML ~~LOC~~ SOAJ: 1.5000 mg | SUBCUTANEOUS | 2 refills | Status: DC

## 2022-07-05 MED ORDER — ALBUTEROL SULFATE HFA 108 (90 BASE) MCG/ACT IN AERS: INHALATION_SPRAY | RESPIRATORY_TRACT | 1 refills | Status: DC

## 2022-07-05 MED ORDER — INSULIN LISPRO (1 UNIT DIAL) 100 UNIT/ML (KWIKPEN): 10.0000 [IU] | PEN_INJECTOR | Freq: Three times a day (TID) | SUBCUTANEOUS | 3 refills | Status: DC

## 2022-07-05 MED ORDER — PREGABALIN 100 MG PO CAPS: 100.0000 mg | ORAL_CAPSULE | Freq: Three times a day (TID) | ORAL | 2 refills | Status: DC | PRN

## 2022-07-05 MED ORDER — LINACLOTIDE 72 MCG PO CAPS: ORAL_CAPSULE | ORAL | 3 refills | Status: DC

## 2022-07-05 MED ORDER — GUAIFENESIN 200 MG PO TABS: 200.0000 mg | ORAL_TABLET | ORAL | 0 refills | Status: DC | PRN

## 2022-07-05 MED ORDER — BUSPIRONE HCL 5 MG PO TABS: 5.0000 mg | ORAL_TABLET | Freq: Two times a day (BID) | ORAL | 1 refills | Status: DC

## 2022-07-05 NOTE — Progress Notes (Signed)
[facility]   Subjective:   [name] is a 66 y.o. female presents for hospital follow up admit date to the hospital was 06/28/22, patient was discharged from the hospital on 06/29/22, patient was admitted for: Altered mental status and aphasia . Patient initially called PCP office family member stated she was shortness of breath and having chest pain instructed them to call 911 and PCP with follow-up after hospital discharge. Past Medical History:  Diagnosis Date   Acute on chronic systolic CHF (congestive heart failure) (HCC) 12/21/2020   Allergic rhinitis    Arthritis    Asthma    Chronic diastolic CHF (congestive heart failure) (HCC)    COPD (chronic obstructive pulmonary disease) (HCC)    Depression    DM (diabetes mellitus) ([place])    DVT (deep vein thrombosis) in pregnancy    GERD (gastroesophageal reflux disease)    HTN (hypertension)    Hyperlipidemia    Obesity    Pneumonia 04/2018   RIGHT LOBE   Sleep apnea    compliant with CPAP     Allergies  Allergen Reactions   Bee Pollen Anaphylaxis and Other (See Comments)    UNCONFIRMED, but IS allergic to bee VENOM   Bee Venom Anaphylaxis   Sulfa Antibiotics Itching      Current Outpatient Medications on File Prior to Visit  Medication Sig Dispense Refill   acetaminophen (TYLENOL) 325 MG tablet Take 2 tablets (650 mg total) by mouth every 6 (six) hours as needed for mild pain or moderate pain.     albuterol (VENTOLIN HFA) 108 (90 Base) MCG/ACT inhaler INHALE 1-2 PUFFS BY MOUTH EVERY 6 HOURS AS NEEDED FOR WHEEZE OR SHORTNESS OF BREATH 6.7 each 1   amiodarone (PACERONE) 200 MG tablet TAKE 1 TABLET BY MOUTH TWICE A DAY 180 tablet 3   apixaban (ELIQUIS) 5 MG TABS tablet TAKE ONE TABLET BY MOUTH TWICE DAILY at [place] (Patient taking differently: Take 5 mg by mouth in the morning and at bedtime.) 60 tablet 1   benzonatate (TESSALON PERLES) 100 MG capsule Take 1 capsule (100 mg total) by mouth 3 (three) times  daily as needed for cough. 20 capsule 0   Blood Glucose Monitoring Suppl (ACCU-CHEK GUIDE) w/Device KIT Use to check blood sugar TID. Dx: E11.69 1 kit 0   busPIRone (BUSPAR) 5 MG tablet Take 1 tablet (5 mg total) by mouth 2 (two) times daily. 180 tablet 1   colchicine 0.6 MG tablet TAKE 0.5 TABLETS BY MOUTH DAILY. 45 tablet 0   diphenhydrAMINE (BENADRYL) 25 mg capsule Take 25 mg by mouth every 6 (six) hours as needed for allergies or itching.     doxycycline (VIBRAMYCIN) 100 MG capsule Take 1 capsule (100 mg total) by mouth 2 (two) times daily. 6 capsule 0   Dulaglutide (TRULICITY) 1.5 GM/0.1UU SOPN Inject 1.5 mg into the skin once a week. 2 mL 2   DULoxetine (CYMBALTA) 60 MG capsule Take 1 capsule (60 mg total) by mouth daily. 180 capsule 0   EPINEPHrine 0.3 mg/0.3 mL IJ SOAJ injection Inject 0.3 mg into the muscle as needed for anaphylaxis. 1 each 1   fluticasone (FLONASE) 50 MCG/ACT nasal spray Place 2 sprays into both nostrils daily as needed for allergies or rhinitis. 16 g 3   glucose blood (ACCU-CHEK GUIDE) test strip USE TO CHECK BLOOD SUGAR 3 TIMES DAILY 100 strip 3   guaiFENesin 200 MG tablet Take 1 tablet (200 mg total) by mouth every  4 (four) hours as needed for cough or to loosen phlegm. (Patient not taking: Reported on 06/20/2022) 30 suppository 0   insulin aspart (NOVOLOG FLEXPEN) 100 UNIT/ML FlexPen Inject 2-10 Units into the skin 3 (three) times daily before meals. Per sliding scale 15 mL 1   isosorbide mononitrate (IMDUR) 30 MG 24 hr tablet Take 0.5 tablets (15 mg total) by mouth daily. 15 tablet 0   KLOR-CON M20 20 MEQ tablet Take 20 mEq by mouth 2 (two) times daily.     lidocaine-prilocaine (EMLA) cream SMARTSIG:sparingly Topical 3 Times a Week     LINZESS 72 MCG capsule TAKE 1 CAPSULE (72 MCG TOTAL) BY MOUTH DAILY BEFORE BREAKFAST. IF BM NOT ACHIEVE WITH 1 CAPSULE SHE MAY TAKE 2 DAILY. STOP IF DIARRHEA OCCURS 60 capsule 1   loratadine (CLARITIN) 10 MG tablet Take 1 tablet (10 mg  total) by mouth daily. 30 tablet 11   metolazone (ZAROXOLYN) 2.5 MG tablet Take 2.5 mg by mouth 2 (two) times a week.     metoprolol succinate (TOPROL-XL) 25 MG 24 hr tablet Take 1 tablet (25 mg total) by mouth daily. 30 tablet 0   Multiple Vitamins-Minerals (MULTIVITAMIN WOMEN PO) Take 1 tablet by mouth daily.     multivitamin (RENA-VIT) TABS tablet Take 1 tablet by mouth daily.     nitroGLYCERIN (NITROSTAT) 0.4 MG SL tablet Place 1 tablet (0.4 mg total) under the tongue every 5 (five) minutes x 3 doses as needed for chest pain. 25 tablet 12   pantoprazole (PROTONIX) 40 MG tablet TAKE ONE TABLET BY MOUTH DAILY AT 9AM 90 tablet 11   polyethylene glycol powder (GLYCOLAX/MIRALAX) 17 GM/SCOOP powder Take 17 g by mouth daily. (Patient taking differently: Take 17 g by mouth daily as needed for mild constipation.) 238 g 0   pregabalin (LYRICA) 100 MG capsule TAKE 1 CAPSULE BY MOUTH THREE TIMES A DAY (Patient taking differently: Take 100 mg by mouth 3 (three) times daily as needed (for pain).) 90 capsule 3   RESTASIS 0.05 % ophthalmic emulsion Place 1 drop into both eyes in the morning and at bedtime.  12   rosuvastatin (CRESTOR) 10 MG tablet TAKE ONE TABLET BY MOUTH DAILY AT 5PM 90 tablet 0   senna-docusate (SENOKOT-S) 8.6-50 MG tablet Take 1 tablet by mouth 2 (two) times daily. 14 tablet 0   torsemide (DEMADEX) 100 MG tablet Take 1 tablet by mouth. Monday Wednesday and Friday     No current facility-administered medications on file prior to visit.     Review of System: Comprehensive ROS Pertinent positive and negative noted in HPI    Objective:  BP 127/85   Pulse 93   Resp 16   Wt 290 lb 9.6 oz (131.8 kg)   SpO2 93%   BMI 45.51 kg/m   Physical Exam: General Appearance: Well nourished, in no apparent distress. Eyes: PERRLA, EOMs, conjunctiva no swelling or erythema Sinuses: No Frontal/maxillary tenderness ENT/Mouth: Ext aud canals clear, TMs without erythema, bulging. No erythema,  swelling, or exudate on post pharynx.  Tonsils not swollen or erythematous. Hearing normal.  Neck: Supple, thyroid normal.  Respiratory: Respiratory effort normal, BS equal bilaterally without rales, rhonchi, wheezing or stridor.  Cardio: RRR with no MRGs. Brisk peripheral pulses without edema.  Abdomen: Soft, + BS.  Non tender, no guarding, rebound, hernias, masses. Lymphatics: Non tender without lymphadenopathy.  Musculoskeletal: Full ROM, 5/5 strength, normal gait.  Skin: Warm, dry without rashes, lesions, ecchymosis.  Neuro: Cranial nerves intact. Normal muscle  tone, no cerebellar symptoms. Sensation intact.  Psych: Awake and oriented X 3, normal affect, Insight and Judgment appropriate.    Assessment:  Janielle was seen today for hospitalization follow-up.  Diagnoses and all orders for this visit:  Diabetes mellitus type 2 in obese [facility]) - educated on lifestyle modifications, including but not limited to diet choices and adding exercise to daily routine.   -     insulin lispro (HUMALOG KWIKPEN) 100 UNIT/ML KwikPen; Inject 10 Units into the skin 3 (three) times daily. -     Dulaglutide (TRULICITY) 1.5 BP/1.0CH SOPN; Inject 1.5 mg into the skin once a week.  Medication refill -     loratadine (CLARITIN) 10 MG tablet; Take 1 tablet (10 mg total) by mouth daily. -     DULoxetine (CYMBALTA) 60 MG capsule; Take 1 capsule (60 mg total) by mouth daily.  Acute frontal sinusitis, recurrence not specified -     loratadine (CLARITIN) 10 MG tablet; Take 1 tablet (10 mg total) by mouth daily. -     fluticasone (FLONASE) 50 MCG/ACT nasal spray; Place 2 sprays into both nostrils daily as needed for allergies or rhinitis.  Cough productive of purulent sputum -     guaiFENesin 200 MG tablet; Take 1 tablet (200 mg total) by mouth every 4 (four) hours as needed for cough or to loosen phlegm.  Diabetic polyneuropathy associated with type 2 diabetes mellitus (HCC) -     DULoxetine (CYMBALTA) 60 MG  capsule; Take 1 capsule (60 mg total) by mouth daily.  Depression, unspecified depression type -     DULoxetine (CYMBALTA) 60 MG capsule; Take 1 capsule (60 mg total) by mouth daily.  Adjustment disorder with mixed anxiety and depressed mood -     busPIRone (BUSPAR) 5 MG tablet; Take 1 tablet (5 mg total) by mouth 2 (two) times daily.  Constipation, unspecified constipation type       linaclotide (LINZESS) 72 MCG capsule; TAKE 1 CAPSULE (72 MCG TOTAL) BY MOUTH DAILY BEFORE BREAKFAST. IF BM NOT ACHIEVE WITH 1 CAPSULE SHE MAY TAKE 2 DAILY. STOP IF DIARRHEA OCCURS  Other orders -     albuterol (VENTOLIN HFA) 108 (90 Base) MCG/ACT inhaler; INHALE 1-2 PUFFS BY MOUTH EVERY 6 HOURS AS NEEDED FOR WHEEZE OR SHORTNESS OF BREATH --     pregabalin (LYRICA) 100 MG capsule; Take 1 capsule (100 mg total) by mouth 3 (three) times daily as needed (for pain).    This note has been created with Surveyor, quantity. Any transcriptional errors are unintentional.   [name], NP 07/05/2022, 10:15 AM

## 2022-07-11 ENCOUNTER — Ambulatory Visit: Payer: Medicare Other

## 2022-07-11 NOTE — Therapy (Incomplete)
OUTPATIENT PHYSICAL THERAPY TREATMENT NOTE   Patient Name: [name] MRN: [id number] DOB:Aug 09, 1956, 66 y.o., female 35 Date: 07/11/2022  PCP: [name], NP  REFERRING PROVIDER: Thurnell Lose, MD   END OF SESSION:    Past Medical History:  Diagnosis Date   Acute on chronic systolic CHF (congestive heart failure) ([place]) 12/21/2020   Allergic rhinitis    Arthritis    Asthma    Chronic diastolic CHF (congestive heart failure) (H[facility])    COPD (chronic obstructive pulmonary disease) ([place])    Depression    DM (diabetes mellitus) ([place])    DVT (deep vein thrombosis) in pregnancy    GERD (gastroesophageal reflux disease)    HTN (hypertension)    Hyperlipidemia    Obesity    Pneumonia 04/2018   RIGHT LOBE   Sleep apnea    compliant with CPAP   Past Surgical History:  Procedure Laterality Date   ABDOMINAL HYSTERECTOMY     AV FISTULA PLACEMENT Left 03/17/2022   Procedure: LEFT ARM ARTERIOVENOUS (AV) FISTULA CREATION;  Surgeon: Angelia Mould, MD;  Location: [place];  Service: Vascular;  Laterality: Left;   BUBBLE STUDY  11/26/2020   Procedure: BUBBLE STUDY;  Surgeon: Buford Dresser, MD;  Location: [place];  Service: Cardiovascular;;   CARDIOVERSION N/A 05/06/2020   Procedure: CARDIOVERSION;  Surgeon: Buford Dresser, MD;  Location: [place];  Service: Cardiovascular;  Laterality: N/A;   CARDIOVERSION N/A 11/04/2020   Procedure: CARDIOVERSION;  Surgeon: Lelon Perla, MD;  Location: [place];  Service: Cardiovascular;  Laterality: N/A;   CARDIOVERSION N/A 02/01/2021   Procedure: CARDIOVERSION;  Surgeon: Jolaine Artist, MD;  Location: [place];  Service: Cardiovascular;  Laterality: N/A;   CARDIOVERSION N/A 02/09/2021   Procedure: CARDIOVERSION;  Surgeon: Jolaine Artist, MD;  Location: [place];  Service: Cardiovascular;  Laterality: N/A;   CARDIOVERSION N/A 04/19/2021   Procedure: CARDIOVERSION;  Surgeon: Fay Records, MD;  Location: [place];  Service: Cardiovascular;  Laterality: N/A;   CARDIOVERSION N/A 11/25/2021   Procedure: CARDIOVERSION;  Surgeon: Jolaine Artist, MD;  Location: [place];  Service: Cardiovascular;  Laterality: N/A;   CARDIOVERSION N/A 01/31/2022   Procedure: CARDIOVERSION;  Surgeon: Jolaine Artist, MD;  Location: [place];  Service: Cardiovascular;  Laterality: N/A;   CATARACT EXTRACTION, BILATERAL     CHOLECYSTECTOMY     COLONOSCOPY WITH PROPOFOL N/A 03/05/2015   Procedure: COLONOSCOPY WITH PROPOFOL;  Surgeon: Carol Ada, MD;  Location: [facility];  Service: Endoscopy;  Laterality: N/A;   DIAGNOSTIC LAPAROSCOPY     FISTULA SUPERFICIALIZATION Left 03/17/2022   Procedure: FISTULA SUPERFICIALIZATION -LEFT;  Surgeon: Angelia Mould, MD;  Location: [facility] OR;  Service: Vascular;  Laterality: Left;   ingrown hallux Left    IR FLUORO GUIDE CV LINE RIGHT  03/14/2022   IR US GUIDE VASC ACCESS RIGHT  03/14/2022   KNEE SURGERY     LEFT HEART CATH AND CORONARY ANGIOGRAPHY N/A 12/31/2017   Procedure: LEFT HEART CATH AND CORONARY ANGIOGRAPHY;  Surgeon: Charolette Forward, MD;  Location: [place] [facility];  Service: Cardiovascular;  Laterality: N/A;   MAZE N/A 12/29/2020   Procedure: MAZE;  Surgeon: [name], MD;  Location: [name];  Service: Open Heart Surgery;  Laterality: N/A;   MITRAL VALVE REPAIR N/A 12/29/2020   Procedure: MITRAL VALVE REPAIR USING CARBOMEDICS ANNULOFLEX RING SIZE 30;  Surgeon: [name], MD;  Location: [place];  Service: Open Heart Surgery;  Laterality:  N/A;   RIGHT HEART CATH N/A 12/22/2020   Procedure: RIGHT HEART CATH;  Surgeon: Larey Dresser, MD;  Location: [place] [facility];  Service: Cardiovascular;  Laterality: N/A;   RIGHT HEART CATH N/A 01/31/2021   Procedure: RIGHT HEART CATH;  Surgeon: Jolaine Artist, MD;  Location: [place] [facility];  Service: Cardiovascular;  Laterality: N/A;   RIGHT HEART CATH N/A  07/29/2021   Procedure: RIGHT HEART CATH;  Surgeon: Jolaine Artist, MD;  Location: [place] [facility] LAB;  Service: Cardiovascular;  Laterality: N/A;   RIGHT HEART CATH N/A 11/24/2021   Procedure: RIGHT HEART CATH;  Surgeon: Jolaine Artist, MD;  Location: [place] [facility];  Service: Cardiovascular;  Laterality: N/A;   RIGHT HEART CATH N/A 02/14/2022   Procedure: RIGHT HEART CATH;  Surgeon: Jolaine Artist, MD;  Location: [place] [facility];  Service: Cardiovascular;  Laterality: N/A;   RIGHT HEART CATH N/A 02/22/2022   Procedure: RIGHT HEART CATH;  Surgeon: Larey Dresser, MD;  Location: [place] CV LAB;  Service: Cardiovascular;  Laterality: N/A;   TEE WITHOUT CARDIOVERSION N/A 11/26/2020   Procedure: TRANSESOPHAGEAL ECHOCARDIOGRAM (TEE);  Surgeon: Buford Dresser, MD;  Location[facility] ENDOSCOPY;  Service: Cardiovascular;  Laterality: N/A;   TEE WITHOUT CARDIOVERSION N/A 12/29/2020   Procedure: TRANSESOPHAGEAL ECHOCARDIOGRAM (TEE);  Surgeon: [name], MD;  Location: [place];  Service: Open Heart Surgery;  Laterality: N/A;   TEE WITHOUT CARDIOVERSION N/A 01/20/2022   Procedure: TRANSESOPHAGEAL ECHOCARDIOGRAM (TEE);  Surgeon: Jolaine Artist, MD;  Location: [place];  Service: Cardiovascular;  Laterality: N/A;   TRICUSPID VALVE REPLACEMENT N/A 12/29/2020   Procedure: 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 RING SIZE 34;  Surgeon: [name], MD;  Location: [place];  Service: Open Heart Surgery;  Laterality: N/A;   Patient Active Problem List   Diagnosis Date Noted   HCAP (healthcare-associated pneumonia) 06/21/2022   ESRD on dialysis ([place]) 06/21/2022   OSA on CPAP 06/21/2022   Acute respiratory failure with hypoxia ([place])    Acute hypoxic respiratory failure ([place]) 06/20/2022   Acute kidney injury superimposed on chronic kidney disease ([place]) 03/18/2022   Class 3 obesity ([place]) 03/18/2022   Depression 01/21/2022   Chronic deep vein thrombosis  (DVT) of other vein of right upper extremity ([place]) 12/29/2021   Abnormal weight loss 11/16/2021   Personal history of colonic polyps 11/16/2021   Rib pain 11/16/2021   Anxiety and depression 08/26/2021   Essential hypertension 08/26/2021   GERD (gastroesophageal reflux disease) 08/26/2021   COPD (chronic obstructive pulmonary disease) ([place]) 07/29/2021   Biventricular heart failure ([place])    Atrial flutter ([place])    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   Encounter for preoperative dental examination    Teeth missing    Gingivitis    Accretions on teeth    Atrial fibrillation, permanent ([place]) 11/02/2020   Acute on chronic right heart failure ([place]) 05/06/2020   NICM (nonischemic cardiomyopathy) ([place]) 04/01/2020   Family history of heart disease 04/01/2020   Mitral regurgitation 04/01/2020   Acute on chronic left systolic heart failure ([place]) 07/01/2018   Acute on chronic diastolic CHF (congestive heart failure) ([place]) 07/01/2018   Chronic atrial fibrillation ([place]) 53/61/4431   Diastolic dysfunction 54/00/8676   Diabetes mellitus type 2 in obese ([place]) 12/26/2017   Hypoxia 09/21/2011   Type 2 diabetes mellitus with hyperlipidemia ([place]) 02/04/2010   AF (paroxysmal atrial fibrillation) ([place])  02/04/2010   Gastroparesis 02/04/2010    REFERRING DIAG: R53.81 (ICD-10-CM) - Physical deconditioning     THERAPY DIAG:  No diagnosis found.  Rationale for Evaluation and Treatment Rehabilitation  PERTINENT HISTORY: HTN, Depression, DM II, Gastroparesis CHF   PRECAUTIONS: Fall   SUBJECTIVE:                                                                                                                                                                                      SUBJECTIVE STATEMENT:  ***   PAIN:  Are you having pain?  Yes: NPRS scale: 5/10 Pain location: lower back Pain description: dull ache Aggravating factors: prolonged sitting Relieving  factors: heat   OBJECTIVE: (objective measures completed at initial evaluation unless otherwise dated)  DIAGNOSTIC FINDINGS: See imaging for recent CT and MRI   PATIENT SURVEYS:  FOTO: 45% function; 55% predicted   COGNITION: Overall cognitive status: Within functional limits for tasks assessed                         SENSATION: WFL   EDEMA:  DNT   POSTURE:  large body habitus, rounded shoulders   PALPATION: Slight TTP to lumbar paraspinals   LOWER EXTREMITY MMT:   MMT Right eval Left eval  Hip flexion 3+/5 3+/5  Hip extension      Hip abduction 3+/5 3+/5  Hip adduction 3+/5 3+/5  Hip internal rotation      Hip external rotation      Knee flexion      Knee extension      Ankle dorsiflexion      Ankle plantarflexion      Ankle inversion      Ankle eversion       (Blank rows = not tested)   LOWER EXTREMITY SPECIAL TESTS:  DNT   FUNCTIONAL TESTS:  5 times sit to stand: 20 Timed up and go (TUG): 20 with SPC   GAIT: Distance walked: 56f Assistive device utilized: Single point cane Level of assistance: Modified independence Comments: slowed gait, wide BoS   TREATMENT: [facility] Adult PT Treatment:                                                DATE: 07/11/2022 Therapeutic Exercise: Sit to Stand 2x10 Seated ball squeeze 2x10 - 5" hold Seated clamshell 2x15 - Blue Theraband Seated march 2x20 - Blue Theraband  [facility]  DATE: 07/03/2022 Therapeutic Exercise: Sit to Stand 2x10 Seated ball squeeze 2x10 - 5" hold Seated clamshell 2x15 - Blue Theraband Seated march 2x20 - Blue Theraband   PATIENT EDUCATION:  Education details: eval findings, FOTO, HEP, POC Person educated: Patient Education method: Explanation, Demonstration, and Handouts Education comprehension: verbalized understanding and returned demonstration   HOME EXERCISE PROGRAM: Sit to Stand 2x10 - 2x/day Seated ball squeeze 2x10 - 5"  hold  - 2x/day Seated clamshell 2x15 - Blue Theraband  - 2x/day Seated march 2x20 - Blue Theraband - 2x/day   ASSESSMENT:   CLINICAL IMPRESSION: ***   OBJECTIVE IMPAIRMENTS: Abnormal gait, decreased activity tolerance, decreased balance, decreased endurance, decreased mobility, difficulty walking, decreased strength, and pain.    ACTIVITY LIMITATIONS: carrying, lifting, standing, squatting, stairs, transfers, bed mobility, bathing, and dressing   PARTICIPATION LIMITATIONS: meal prep, cleaning, driving, shopping, community activity, and yard work   PERSONAL FACTORS: Fitness and 3+ comorbidities: HTN, Depression, DM II, Gastroparesis CHF  are also affecting patient's functional outcome.    REHAB POTENTIAL: Good   CLINICAL DECISION MAKING: Evolving/moderate complexity   EVALUATION COMPLEXITY: Moderate     GOALS: Goals reviewed with patient? No   SHORT TERM GOALS: Target date: 07/24/2022  Pt will be compliant and knowledgeable with initial HEP for improved comfort and carryover Baseline: initial HEP given  Goal status: INITIAL   LONG TERM GOALS: Target date: 08/14/2022    Pt will improve FOTO function score to no less than 55% as proxy for functional improvement Baseline: 45% function Goal status: INITIAL    2.  Pt will decrease Five Times Sit to Stand to no greater than 14 seconds for improved balance and functional mobility Baseline: 20 seconds Goal status: INITIAL   3.  Pt will decrease TUG with LRAD to no greater than 14 seconds for improved balance and functional mobility Baseline: 20 seconds with SPC Goal status: INITIAL   4.  Pt will increase all LE MMT to no less than 4/5 for improved strength and function Baseline: see cahrt Goal status: INITIAL   PLAN:   PT FREQUENCY: 1-2x/week   PT DURATION: 6 weeks   PLANNED INTERVENTIONS: Therapeutic exercises, Therapeutic activity, Neuromuscular re-education, Balance training, Gait training, Patient/Family education,  Self Care, Joint mobilization, Aquatic Therapy, Electrical stimulation, Manual therapy, and Re-evaluation   PLAN FOR NEXT SESSION: assess HEP response, supine strengthening, progress as able   Ward Chatters, PT 07/11/2022, 7:50 AM

## 2022-07-18 NOTE — Therapy (Signed)
OUTPATIENT PHYSICAL THERAPY TREATMENT NOTE   Patient Name: [name] MRN: [id number] DOB:1955-10-26, 66 y.o., female 74 Date: 07/19/2022  PCP: [name], NP  REFERRING PROVIDER: Thurnell Lose, MD   END OF SESSION:   PT End of Session - 07/19/22 0959     Visit Number 2    Number of Visits 13    Date for PT Re-Evaluation 08/14/22    Authorization Type [employer] Medicare    PT Start Time 1000    PT Stop Time 1038    PT Time Calculation (min) 38 min    Activity Tolerance Patient tolerated treatment well    Behavior During Therapy WFL for tasks assessed/performed             Past Medical History:  Diagnosis Date   Acute on chronic systolic CHF (congestive heart failure) ([place]) 12/21/2020   Allergic rhinitis    Arthritis    Asthma    Chronic diastolic CHF (congestive heart failure) ([facility])    COPD (chronic obstructive pulmonary disease) ([place])    Depression    DM (diabetes mellitus) ([place])    DVT (deep vein thrombosis) in pregnancy    GERD (gastroesophageal reflux disease)    HTN (hypertension)    Hyperlipidemia    Obesity    Pneumonia 04/2018   RIGHT LOBE   Sleep apnea    compliant with CPAP   Past Surgical History:  Procedure Laterality Date   ABDOMINAL HYSTERECTOMY     AV FISTULA PLACEMENT Left 03/17/2022   Procedure: LEFT ARM ARTERIOVENOUS (AV) FISTULA CREATION;  Surgeon: Angelia Mould, MD;  Location: [place];  Service: Vascular;  Laterality: Left;   BUBBLE STUDY  11/26/2020   Procedure: BUBBLE STUDY;  Surgeon: Buford Dresser, MD;  Location: [place];  Service: Cardiovascular;;   CARDIOVERSION N/A 05/06/2020   Procedure: CARDIOVERSION;  Surgeon: Buford Dresser, MD;  Location: [place];  Service: Cardiovascular;  Laterality: N/A;   CARDIOVERSION N/A 11/04/2020   Procedure: CARDIOVERSION;  Surgeon: Lelon Perla, MD;  Location: [place];  Service: Cardiovascular;  Laterality: N/A;   CARDIOVERSION N/A 02/01/2021    Procedure: CARDIOVERSION;  Surgeon: Jolaine Artist, MD;  Location: [place];  Service: Cardiovascular;  Laterality: N/A;   CARDIOVERSION N/A 02/09/2021   Procedure: CARDIOVERSION;  Surgeon: Jolaine Artist, MD;  Location: [place];  Service: Cardiovascular;  Laterality: N/A;   CARDIOVERSION N/A 04/19/2021   Procedure: CARDIOVERSION;  Surgeon: Fay Records, MD;  Location: [place];  Service: Cardiovascular;  Laterality: N/A;   CARDIOVERSION N/A 11/25/2021   Procedure: CARDIOVERSION;  Surgeon: Jolaine Artist, MD;  Location: [place];  Service: Cardiovascular;  Laterality: N/A;   CARDIOVERSION N/A 01/31/2022   Procedure: CARDIOVERSION;  Surgeon: Jolaine Artist, MD;  Location: [place];  Service: Cardiovascular;  Laterality: N/A;   CATARACT EXTRACTION, BILATERAL     CHOLECYSTECTOMY     COLONOSCOPY WITH PROPOFOL N/A 03/05/2015   Procedure: COLONOSCOPY WITH PROPOFOL;  Surgeon: Carol Ada, MD;  Location: [facility];  Service: Endoscopy;  Laterality: N/A;   DIAGNOSTIC LAPAROSCOPY     FISTULA SUPERFICIALIZATION Left 03/17/2022   Procedure: FISTULA SUPERFICIALIZATION -LEFT;  Surgeon: Angelia Mould, MD;  Location: [place];  Service: Vascular;  Laterality: Left;   ingrown hallux Left    IR FLUORO GUIDE CV LINE RIGHT  03/14/2022   IR US GUIDE VASC ACCESS RIGHT  03/14/2022   KNEE SURGERY     LEFT HEART CATH AND CORONARY ANGIOGRAPHY N/A 12/31/2017  Procedure: LEFT HEART CATH AND CORONARY ANGIOGRAPHY;  Surgeon: Charolette Forward, MD;  Location: [facility];  Service: Cardiovascular;  Laterality: N/A;   MAZE N/A 12/29/2020   Procedure: MAZE;  Surgeon: [name], MD;  Location: [place];  Service: Open Heart Surgery;  Laterality: N/A;   MITRAL VALVE REPAIR N/A 12/29/2020   Procedure: MITRAL VALVE REPAIR USING CARBOMEDICS ANNULOFLEX RING SIZE 30;  Surgeon: [name], MD;  Location: [place];  Service: Open Heart Surgery;  Laterality: N/A;   RIGHT  HEART CATH N/A 12/22/2020   Procedure: RIGHT HEART CATH;  Surgeon: Larey Dresser, MD;  Location: [place] [facility];  Service: Cardiovascular;  Laterality: N/A;   RIGHT HEART CATH N/A 01/31/2021   Procedure: RIGHT HEART CATH;  Surgeon: Jolaine Artist, MD;  Location: [place] [facility];  Service: Cardiovascular;  Laterality: N/A;   RIGHT HEART CATH N/A 07/29/2021   Procedure: RIGHT HEART CATH;  Surgeon: Jolaine Artist, MD;  Location: [place] [facility];  Service: Cardiovascular;  Laterality: N/A;   RIGHT HEART CATH N/A 11/24/2021   Procedure: RIGHT HEART CATH;  Surgeon: Jolaine Artist, MD;  Location: [place] [facility];  Service: Cardiovascular;  Laterality: N/A;   RIGHT HEART CATH N/A 02/14/2022   Procedure: RIGHT HEART CATH;  Surgeon: Jolaine Artist, MD;  Location: [place] [facility];  Service: Cardiovascular;  Laterality: N/A;   RIGHT HEART CATH N/A 02/22/2022   Procedure: RIGHT HEART CATH;  Surgeon: Larey Dresser, MD;  Location: [place] [facility];  Service: Cardiovascular;  Laterality: N/A;   TEE WITHOUT CARDIOVERSION N/A 11/26/2020   Procedure: TRANSESOPHAGEAL ECHOCARDIOGRAM (TEE);  Surgeon: Buford Dresser, MD;  Location: [facility];  Service: Cardiovascular;  Laterality: N/A;   TEE WITHOUT CARDIOVERSION N/A 12/29/2020   Procedure: TRANSESOPHAGEAL ECHOCARDIOGRAM (TEE);  Surgeon: [name], MD;  Location: [place];  Service: Open Heart Surgery;  Laterality: N/A;   TEE WITHOUT CARDIOVERSION N/A 01/20/2022   Procedure: TRANSESOPHAGEAL ECHOCARDIOGRAM (TEE);  Surgeon: Jolaine Artist, MD;  Location: [place];  Service: Cardiovascular;  Laterality: N/A;   TRICUSPID VALVE REPLACEMENT N/A 12/29/2020   Procedure: 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 RING SIZE 34;  Surgeon: [name], MD;  Location: [place];  Service: Open Heart Surgery;  Laterality: N/A;   Patient Active Problem List   Diagnosis Date Noted   HCAP (healthcare-associated  pneumonia) 06/21/2022   ESRD on dialysis ([place]) 06/21/2022   OSA on CPAP 06/21/2022   Acute respiratory failure with hypoxia ([place])    Acute hypoxic respiratory failure ([place]) 06/20/2022   Acute kidney injury superimposed on chronic kidney disease ([place]) 03/18/2022   Class 3 obesity ([place]) 03/18/2022   Depression 01/21/2022   Chronic deep vein thrombosis (DVT) of other vein of right upper extremity ([place]) 12/29/2021   Abnormal weight loss 11/16/2021   Personal history of colonic polyps 11/16/2021   Rib pain 11/16/2021   Anxiety and depression 08/26/2021   Essential hypertension 08/26/2021   GERD (gastroesophageal reflux disease) 08/26/2021   COPD (chronic obstructive pulmonary disease) ([place]) 07/29/2021   Biventricular heart failure ([place])    Atrial flutter ([place])    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   Encounter for preoperative dental examination    Teeth missing    Gingivitis    Accretions on teeth    Atrial fibrillation, permanent ([place]) 11/02/2020   Acute on chronic right heart failure ([place])  05/06/2020   NICM (nonischemic cardiomyopathy) ([place]) 04/01/2020   Family history of heart disease 04/01/2020   Mitral regurgitation 04/01/2020   Acute on chronic left systolic heart failure ([place]) 07/01/2018   Acute on chronic diastolic CHF (congestive heart failure) ([place]) 07/01/2018   Chronic atrial fibrillation ([place]) 88/50/2774   Diastolic dysfunction 12/87/8676   Diabetes mellitus type 2 in obese ([place]) 12/26/2017   Hypoxia 09/21/2011   Type 2 diabetes mellitus with hyperlipidemia ([place]) 02/04/2010   AF (paroxysmal atrial fibrillation) ([place]) 02/04/2010   Gastroparesis 02/04/2010    REFERRING DIAG: R53.81 (ICD-10-CM) - Physical deconditioning   THERAPY DIAG:  Muscle weakness (generalized)  Other abnormalities of gait and mobility  Unsteadiness on feet  Rationale for Evaluation and Treatment Rehabilitation  PERTINENT HISTORY: HTN, Depression, DM  II, Gastroparesis CHF   PRECAUTIONS: Fall  SUBJECTIVE:                                                                                                                                                                                      SUBJECTIVE STATEMENT:  Patient reports daily HEP compliance and that her knees and thighs are hurting today. She reports no new falls.   PAIN:  Are you having pain? Yes: NPRS scale: 8/10 Pain location: lower back, thighs, knees Pain description: dull ache Aggravating factors: prolonged sitting Relieving factors: heat   OBJECTIVE: (objective measures completed at initial evaluation unless otherwise dated)   DIAGNOSTIC FINDINGS: See imaging for recent CT and MRI   PATIENT SURVEYS:  FOTO: 45% function; 55% predicted   COGNITION: Overall cognitive status: Within functional limits for tasks assessed                         SENSATION: WFL   EDEMA:  DNT   POSTURE:  large body habitus, rounded shoulders   PALPATION: Slight TTP to lumbar paraspinals   LOWER EXTREMITY MMT:   MMT Right eval Left eval  Hip flexion 3+/5 3+/5  Hip extension      Hip abduction 3+/5 3+/5  Hip adduction 3+/5 3+/5  Hip internal rotation      Hip external rotation      Knee flexion      Knee extension      Ankle dorsiflexion      Ankle plantarflexion      Ankle inversion      Ankle eversion       (Blank rows = not tested)   LOWER EXTREMITY SPECIAL TESTS:  DNT   FUNCTIONAL TESTS:  5 times sit to stand: 20 Timed up and go (TUG): 20 with SPC   GAIT: Distance walked:  59f Assistive device utilized: Single point cane Level of assistance: Modified independence Comments: slowed gait, wide BoS   [facility] Adult PT Treatment:                                                DATE: 07/19/2022 Therapeutic Exercise: Nustep level 5 x 5 mins while gathering subjective STS x10, with 5# KB x5 (pt reports SOB) Supine clamshells BlueTB 2x10 Supine marching BlueTB 2x10  BIL Supine ball squeeze 5" hold 2x10 Standing hip abduction with UE support x10 BIL Neuromuscular re-ed: FT stance x30" Semi tandem stance x30" BIL (LOB with Lt in back) FT with head turns x30"   [facility] Adult PT Treatment:                                                DATE: 07/03/2022 Therapeutic Exercise: Sit to Stand 2x10 Seated ball squeeze 2x10 - 5" hold Seated clamshell 2x15 - Blue Theraband Seated march 2x20 - Blue Theraband   PATIENT EDUCATION:  Education details: eval findings, FOTO, HEP, POC Person educated: Patient Education method: Explanation, Demonstration, and Handouts Education comprehension: verbalized understanding and returned demonstration   HOME EXERCISE PROGRAM: Sit to Stand 2x10 - 2x/day Seated ball squeeze 2x10 - 5" hold  - 2x/day Seated clamshell 2x15 - Blue Theraband  - 2x/day Seated march 2x20 - Blue Theraband - 2x/day   ASSESSMENT:   CLINICAL IMPRESSION: Patient presents to PT reporting pain in her lower back, thighs, and knees today and reports HEP compliance and no new falls since evaluation. Session today focused on BIL LE strengthening as well as balance tasks to decrease fall risk. She demonstrated the most difficulty in tandem stance with LLE in back with LOB x2 and needing UE support to recover. Supine exercises terminated early due to patient reporting SOB while performing and she also reports SOB with STS. Patient continues to benefit from skilled PT services and should be progressed as able to improve functional independence.     OBJECTIVE IMPAIRMENTS: Abnormal gait, decreased activity tolerance, decreased balance, decreased endurance, decreased mobility, difficulty walking, decreased strength, and pain.    ACTIVITY LIMITATIONS: carrying, lifting, standing, squatting, stairs, transfers, bed mobility, bathing, and dressing   PARTICIPATION LIMITATIONS: meal prep, cleaning, driving, shopping, community activity, and yard work   PERSONAL  FACTORS: Fitness and 3+ comorbidities: HTN, Depression, DM II, Gastroparesis CHF  are also affecting patient's functional outcome.    REHAB POTENTIAL: Good   CLINICAL DECISION MAKING: Evolving/moderate complexity   EVALUATION COMPLEXITY: Moderate     GOALS: Goals reviewed with patient? No   SHORT TERM GOALS: Target date: 07/24/2022  Pt will be compliant and knowledgeable with initial HEP for improved comfort and carryover Baseline: initial HEP given  Goal status: INITIAL   LONG TERM GOALS: Target date: 08/14/2022    Pt will improve FOTO function score to no less than 55% as proxy for functional improvement Baseline: 45% function Goal status: INITIAL    2.  Pt will decrease Five Times Sit to Stand to no greater than 14 seconds for improved balance and functional mobility Baseline: 20 seconds Goal status: INITIAL   3.  Pt will decrease TUG with LRAD to no greater than 14 seconds  for improved balance and functional mobility Baseline: 20 seconds with SPC Goal status: INITIAL   4.  Pt will increase all LE MMT to no less than 4/5 for improved strength and function Baseline: see cahrt Goal status: INITIAL   PLAN:   PT FREQUENCY: 1-2x/week   PT DURATION: 6 weeks   PLANNED INTERVENTIONS: Therapeutic exercises, Therapeutic activity, Neuromuscular re-education, Balance training, Gait training, Patient/Family education, Self Care, Joint mobilization, Aquatic Therapy, Electrical stimulation, Manual therapy, and Re-evaluation   PLAN FOR NEXT SESSION: assess HEP response, supine strengthening, progress as able   Margarette Canada, PTA 07/19/2022, 10:39 AM

## 2022-07-19 ENCOUNTER — Ambulatory Visit: Payer: Medicare Other

## 2022-07-19 DIAGNOSIS — R2681 Unsteadiness on feet: Secondary | ICD-10-CM

## 2022-07-19 DIAGNOSIS — M6281 Muscle weakness (generalized): Secondary | ICD-10-CM | POA: Diagnosis not present

## 2022-07-19 DIAGNOSIS — R2689 Other abnormalities of gait and mobility: Secondary | ICD-10-CM

## 2022-07-24 NOTE — Progress Notes (Incomplete)
Advanced Heart Failure Clinic Note    PCP: [name], NP PCP-Cardiologist: Buford Dresser, MD  [facility]: Dr. [name]   Reason for Visit: Heart Failure F/u  HPI: Ms Klinke is a 66 y.o. with a history of morbid obesity, chronic diastolic and systolic HF with mildly reduced LVEF (45-50%), severe MR/TR, atrial fibrillation on Eliquis, HTN, and DMII.    She has a known history of HF with mildly reduced LVEF of 45-50% with cath in 2019 with mild, non-obstructive CAD. Also with history of difficult to control Afib with failed Tikosyn, later switched to amiodarone, dig, metop and apixaban for [facility].    Admitted 5//22 with worsening HF.  LHC 5/19 non-obstructive CAD. RHC 12/22/20 with elevated filling pressures and low CI.    Underwent MV/TV repair with Maze on 12/30/20. Post-operatively, she struggled  with low output progressive renal failure and volume overload.  Post operative Echo showed LVEF 55% RV moderately HK. Moderate TR. No significant MR. D/c wt was 281 lb. SCr was 2.8. She was also in Afib day of d/c.    Admitted 6/22 for A/CHF and AF diuresed with IV lasix, RHC showed low filling pressures and moderately reduced CO. She underwent successful DCCV 02/01/21. 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    Seen in ED 02/07/21 fShe was back in Afib, rate controlled. She was loaded w amiodarone gtt, successful DCCV on 02/09/21.    Back in AF 7/22. Repeat echo showed new reduction in EF, in setting of AFL with RVR,  EF 30-35%, moderate LVH, moderate RV dysfunction. Transitioned to po amio and torsemide, discharge weight 271 lbs.   S/p DCCV 04/19/21, but back in afib at follow up a week later at [facility] appointment.   Admitted 9/22 with CP and AKI on CKD IV. SCr 3.12 in ED, likely due to over diuresis. discharge, weight 268 lbs.  Admitted 1/23 x 2  with respiratory distress due to HF and COVID, Had recurrent AF. Echo EF 45%  Admitted 4/23 with recurrent HF in setting of recurrent  AF. Echo 35-40% RV moderately down severe TR. [place] 11/24/21 notable for severely elevated biventricular pressures. Started on milrinone and IV lasix.   Underwent DC-CV to NSR on 11/25/21. Loaded with IV amio. Developed severe stuttering. Code Stroke called. CT/MRI normal. Resolved spontaneously. Developed AKI. Discharged home 12/07/21. Scr 2.9. Wt 295   RA = 29 RV = 65/30 PA = 67/37 (45) PCW = 30 (v waves to 45) Fick cardiac output/index = 6.2/2.5 PVR = 2.4 WU Ao sat = 93% PA sat = 50%, 55% PAPi = 1.0   Readmitted 05/30-06/05/23 with a/c CHF after failing escalation of diuretics in outpatient setting. Initially required lasix gtt which was later transitioned to PO torsemide 60 BID + 2.5 mg metolazone M, W, F. TEE during admit with severe biventricular dysfunction, stable MV prosthesis and severe TR. At discharge she was referred to structural heart team for consideration of TV VIV.  She saw Dr. Burt Knack yesterday, she is hoping she will be candidate for TV VIV.  Here today for hospital follow-up. Reports less dyspnea and leg edema than she's had in a while. No CP. She denies palpitations. Home weight since discharge 289-291 lb. She does feel a little off, can tell she's back in atrial flutter. Taking all medications as prescribed. Watching fluid and sodium intake.   Review of systems complete and found to be negative unless listed in HPI.    Cardiac Studies - Echo (11/13): EF 60-65%,  moderate TR - Echo (11/14): EF 60-65%, severe TR, PA peak pressure 47 mmHg - Echo (5/15): EF 50-55% - Exercise stress test (7/6): A. Fib. with controlled vent. response OLD inferior MI and non sp. T wave changes at baseline. - Echo (8/17): EF 50-55%, moderate MR - Exercise stress test (8/17): Atrial Fib. Q wave in lead 3 and AVF with nonsp T wave changes at baseline - Echo (5/19): EF 55-60%, moderate MR - Exercise stress test (5/19): Afib, normal. - LHC (5/19): Prox LAD lesion is 10% stenosed, Dist LM to Ost  LAD lesion is 10% stenosed, Ost Cx lesion is 15% stenosed, LV systolic function normal, LVEDP is normal. - Echo (11/19): EF 45-50%, severe MR/TR - Echo (11/21): EF 45-50%, severe MR (rheumatic)/TR - Echo (4/22): EF 45-50%, severe MR mean gradient 2.0 mmHg, severe TR - Echo (5/22): EF 60-65%, s/p MVR, trace MR mean gradient 7.0 mmHg, moderate TR - RHC (5/22): Elevated R > L heart filling pressures with PAPi low but not markedly so (1.8) suggestive of a significant component of RV dysfunction. Prominent v-waves in PCWP and RA pressure tracings suggestive of significant MR and TR. Cardiac index low. - RHC (6/22): low filling pressures, moderate reduced CO. - Echo (7/22): EF 30-35%, moderate LVH, moderate RV dysfunction, PASP 48, s/p MV repair with mild MR and mean gradient 10 mmHg with HR around 130, s/p TV repair with mild-moderate TR, dilated IVC.   RHC 01/31/21  RA = 5 RV = 33/2 PA = 35/13 (22) PCW = 13 Fick cardiac output/index = 4.3/2.0 PVR = 2.1 WU FA sat = 97% PA sat = 54%, 54% - DCCV (02/01/21): successful conversion to NSR. - DCCV (02/09/21): successful conversion to NSR. -DCCV (830/22): successful conversion to NSR.  ROS: All systems negative except as listed in HPI, PMH and Problem List.  SH:  Social History   Socioeconomic History   Marital status: Significant Other    Spouse name: Not on file   Number of children: 2   Years of education: Not on file   Highest education level: Some college, no degree  Occupational History   Not on file  Tobacco Use   Smoking status: Never    Passive exposure: Never   Smokeless tobacco: Never  Vaping Use   Vaping Use: Never used  Substance and Sexual Activity   Alcohol use: No   Drug use: No   Sexual activity: Not Currently  Other Topics Concern   Not on file  Social History Narrative   Pt lives in [place] with spouse.  2 grown children.   Previously worked in [employer] at [employer].  Now on disability   Social Determinants of  Health   Financial Resource Strain: Low Risk  (03/31/2022)   Overall Financial Resource Strain (CARDIA)    Difficulty of Paying Living Expenses: Not hard at all  Food Insecurity: No Food Insecurity (03/31/2022)   Hunger Vital Sign    Worried About Running Out of Food in the Last Year: Never true    Ran Out of Food in the Last Year: Never true  Transportation Needs: No Transportation Needs (03/31/2022)   PRAPARE - Hydrologist (Medical): No    Lack of Transportation (Non-Medical): No  Physical Activity: Sufficiently Active (03/31/2022)   Exercise Vital Sign    Days of Exercise per Week: 4 days    Minutes of Exercise per Session: 40 min  Stress: Stress Concern Present (03/31/2022)   [employer]  [employer] Occupational [employer] - Occupational Stress Questionnaire    Feeling of Stress : Very much  Social Connections: Socially Isolated (03/31/2022)   Social Connection and Isolation Panel [NHANES]    Frequency of Communication with Friends and Family: More than three times a week    Frequency of Social Gatherings with Friends and Family: More than three times a week    Attends Religious Services: Never    Marine scientist or Organizations: No    Attends Archivist Meetings: Never    Marital Status: Widowed  Intimate Partner Violence: Not At Risk (03/31/2022)   Humiliation, Afraid, Rape, and Kick questionnaire    Fear of Current or Ex-Partner: No    Emotionally Abused: No    Physically Abused: No    Sexually Abused: No   FH:  Family History  Problem Relation Age of Onset   Breast cancer Mother    Cancer Mother    Hypertension Mother    Cancer Father    Hypertension Sister    Hypertension Brother    CAD Other    Past Medical History:  Diagnosis Date   Acute on chronic systolic CHF (congestive heart failure) ([place]) 12/21/2020   Allergic rhinitis    Arthritis    Asthma    Chronic diastolic CHF (congestive heart failure) (H[facility])    COPD (chronic  obstructive pulmonary disease) (H[facility])    Depression    DM (diabetes mellitus) ([place])    DVT (deep vein thrombosis) in pregnancy    GERD (gastroesophageal reflux disease)    HTN (hypertension)    Hyperlipidemia    Obesity    Pneumonia 04/2018   RIGHT LOBE   Sleep apnea    compliant with CPAP   Current Outpatient Medications  Medication Sig Dispense Refill   acetaminophen (TYLENOL) 325 MG tablet Take 2 tablets (650 mg total) by mouth every 6 (six) hours as needed for mild pain or moderate pain.     albuterol (VENTOLIN HFA) 108 (90 Base) MCG/ACT inhaler INHALE 1-2 PUFFS BY MOUTH EVERY 6 HOURS AS NEEDED FOR WHEEZE OR SHORTNESS OF BREATH 18 each 1   amiodarone (PACERONE) 200 MG tablet TAKE 1 TABLET BY MOUTH TWICE A DAY 180 tablet 3   apixaban (ELIQUIS) 5 MG TABS tablet TAKE ONE TABLET BY MOUTH TWICE DAILY at [place] (Patient taking differently: Take 5 mg by mouth in the morning and at bedtime.) 60 tablet 1   benzonatate (TESSALON PERLES) 100 MG capsule Take 1 capsule (100 mg total) by mouth 3 (three) times daily as needed for cough. 20 capsule 0   Blood Glucose Monitoring Suppl (ACCU-CHEK GUIDE) w/Device KIT Use to check blood sugar TID. Dx: E11.69 1 kit 0   busPIRone (BUSPAR) 5 MG tablet Take 1 tablet (5 mg total) by mouth 2 (two) times daily. 180 tablet 1   colchicine 0.6 MG tablet TAKE 0.5 TABLETS BY MOUTH DAILY. 45 tablet 0   diphenhydrAMINE (BENADRYL) 25 mg capsule Take 25 mg by mouth every 6 (six) hours as needed for allergies or itching.     doxycycline (VIBRAMYCIN) 100 MG capsule Take 1 capsule (100 mg total) by mouth 2 (two) times daily. 6 capsule 0   Dulaglutide (TRULICITY) 1.5 OA/4.1YS SOPN Inject 1.5 mg into the skin once a week. 2 mL 2   DULoxetine (CYMBALTA) 60 MG capsule Take 1 capsule (60 mg total) by mouth daily. 180 capsule 1   EPINEPHrine 0.3 mg/0.3 mL IJ SOAJ injection Inject 0.3 mg  into the muscle as needed for anaphylaxis. 1 each 1   fluticasone (FLONASE) 50 MCG/ACT nasal  spray Place 2 sprays into both nostrils daily as needed for allergies or rhinitis. 16 g 3   glucose blood (ACCU-CHEK GUIDE) test strip USE TO CHECK BLOOD SUGAR 3 TIMES DAILY 100 strip 3   guaiFENesin 200 MG tablet Take 1 tablet (200 mg total) by mouth every 4 (four) hours as needed for cough or to loosen phlegm. 30 suppository 0   insulin lispro (HUMALOG KWIKPEN) 100 UNIT/ML KwikPen Inject 10 Units into the skin 3 (three) times daily. 15 mL 3   isosorbide mononitrate (IMDUR) 30 MG 24 hr tablet Take 0.5 tablets (15 mg total) by mouth daily. 15 tablet 0   KLOR-CON M20 20 MEQ tablet Take 20 mEq by mouth 2 (two) times daily.     lidocaine-prilocaine (EMLA) cream SMARTSIG:sparingly Topical 3 Times a Week     linaclotide (LINZESS) 72 MCG capsule TAKE 1 CAPSULE (72 MCG TOTAL) BY MOUTH DAILY BEFORE BREAKFAST. IF BM NOT ACHIEVE WITH 1 CAPSULE SHE MAY TAKE 2 DAILY. STOP IF DIARRHEA OCCURS 60 capsule 3   loratadine (CLARITIN) 10 MG tablet Take 1 tablet (10 mg total) by mouth daily. 30 tablet 11   metolazone (ZAROXOLYN) 2.5 MG tablet Take 2.5 mg by mouth 2 (two) times a week.     metoprolol succinate (TOPROL-XL) 25 MG 24 hr tablet Take 1 tablet (25 mg total) by mouth daily. 30 tablet 0   Multiple Vitamins-Minerals (MULTIVITAMIN WOMEN PO) Take 1 tablet by mouth daily.     multivitamin (RENA-VIT) TABS tablet Take 1 tablet by mouth daily.     nitroGLYCERIN (NITROSTAT) 0.4 MG SL tablet Place 1 tablet (0.4 mg total) under the tongue every 5 (five) minutes x 3 doses as needed for chest pain. 25 tablet 12   pantoprazole (PROTONIX) 40 MG tablet TAKE ONE TABLET BY MOUTH DAILY AT 9AM 90 tablet 11   polyethylene glycol powder (GLYCOLAX/MIRALAX) 17 GM/SCOOP powder Take 17 g by mouth daily. (Patient taking differently: Take 17 g by mouth daily as needed for mild constipation.) 238 g 0   pregabalin (LYRICA) 100 MG capsule Take 1 capsule (100 mg total) by mouth 3 (three) times daily as needed (for pain). 90 capsule 2    RESTASIS 0.05 % ophthalmic emulsion Place 1 drop into both eyes in the morning and at bedtime.  12   rosuvastatin (CRESTOR) 10 MG tablet TAKE ONE TABLET BY MOUTH DAILY AT 5PM 90 tablet 0   senna-docusate (SENOKOT-S) 8.6-50 MG tablet Take 1 tablet by mouth 2 (two) times daily. 14 tablet 0   torsemide (DEMADEX) 100 MG tablet Take 1 tablet by mouth. Monday Wednesday and Friday     No current facility-administered medications for this visit.   There were no vitals filed for this visit.    Wt Readings from Last 3 Encounters:  07/05/22 131.8 kg (290 lb 9.6 oz)  06/28/22 127.9 kg (282 lb)  06/24/22 128.3 kg (282 lb 13.3 oz)     PHYSICAL EXAM: General:  No distress. Arrived in wheelchair. HEENT: normal Neck: supple. no JVD. Carotids 2+ bilat; no bruits.  Cor: PMI nondisplaced. Irregular rate & rhythm. No rubs, gallops, 2/6 TR Lungs: clear Abdomen: obese, soft, nontender, nondistended.  Extremities: no cyanosis, clubbing, rash, trace edema Neuro: alert & orientedx3, cranial nerves grossly intact. moves all 4 extremities w/o difficulty. Affect pleasant   EKG: Atrial flutter 89 bpm.   ECG reviewed with Dr.  McLean.   ASSESSMENT & PLAN:  1. Acute on chronic Biventricular Systolic Heart Failure: - RHC (6/22) with low filling pressures with moderately reduced CO. - Echo (7/22) in setting of AFL with RVR, showed EF 30-35%, moderate LVH, moderate RV dysfunction, PASP 48, s/p MV repair with mild MR and mean gradient 10 mmHg with HR around 130, s/p TV repair with mild-moderate TR, dilated IVC.   - RHC 4/23 markedly elevated biventricular pressures - Echo 4/23 35-40% RV moderately down severe TR. Moderate MS mean grad 51m HG - TEE 06/02: Severe biventricular dysfunction, trivial MR, mean gradient 3 across MV prosthesis, severe TR - Multiple admissions recently for HF mostly occur in setting of recurrent AF. Currently in NSR. - NYHA IIIb/IV. Volume appears stable, looks and feels the best she has  in a while. Continues on Torsemide 60 BID + 2.5 mg metolazone M, W, F. BMP, BNP today. - In and out of AFL at recent visits. AFL today (see below) - GDMT limited by AKI on CKD. No BP room for bidil. - Continue toprol xl 25 mg bid - Worry may have end-stage disease. At some point, HD may be only way to manage volume status.   2. PAF/AFL - She has failed Tikosyn. - She had Maze in 5/22 and DCCV in 6/22 & 04/19/21, last of which she had ERAF. - Multiple admissions recently for HF mostly occur in setting of recurrent AF. Last DCCV 11/24/21 - AFL at f/u 05/01, back in SR at f/u 05/16. Back in AFL today. She has been in and out of atrial flutter recently. She will return to clinic tomorrow for ECG. If remains in AFL, will plan to arrange DCCV. - Continue amiodarone + Eliquis  - Unable to tolerate AF at all. Follows with EP. Not a candidate for Afib ablation or AV nodal ablation/PPM - Has OSA, awaiting split night study for CPAP titration.    3. CKD Stage IV   - New baseline creatinine  ~ 2.6 - 2.8. Stabilized around 3.1 during recent admit - No SGLT2i, GFR has been consistently less than 20 - Follows with Nephrology - Labs today   4. CAD - Cath (2019): Mild nonobstructive CAD -  No chest pain.  - Continue statin + beta blocker - no ASA w/ Eliquis    5. Valvular Heart Disease: - S/p MV and TV repair in 5/22.  - Echo 02/25/21 with mild-moderate TR.   - Echo 4/23 35-40% RV moderately down. severe TR. Moderate MS mean gradient 838mG  - TEE 06/23: severe biventricular dysfunction, trivial MR, mean gradient 3 across MV prosthesis, severe TR - Seen by Dr. CoBurt Knackesterday. Consider TV VIV   6. Obesity - Body mass index is 46.75 kg/m. -Discussed portion control.    7. DMII - Hgb A1c 6.7% 04/23 - GFR has been too low for SGLT2i   8. OSA - initial sleep study positive but was awaiting split night study to titrate CPAP - Has been referred to Dr. TuRadford Pax suspect this is part of her AF  recurrence   9. PVCs - Single PVC on ECG today - Already on PO amiodarone for AF/AFL      Follow-up: Needs close f/u. ECG tomorrow. F/u with Dr. ChHarrell Gaveprimary Cardiologist 07/07), APP clinic 08/03.  JeMaricela Boilford PA-C 1:32 PM

## 2022-07-26 ENCOUNTER — Telehealth: Payer: Self-pay

## 2022-07-26 ENCOUNTER — Ambulatory Visit: Payer: Medicare Other | Attending: Primary Care

## 2022-07-26 ENCOUNTER — Encounter (HOSPITAL_COMMUNITY): Payer: Medicaid Other

## 2022-07-26 DIAGNOSIS — R2689 Other abnormalities of gait and mobility: Secondary | ICD-10-CM | POA: Insufficient documentation

## 2022-07-26 DIAGNOSIS — M6281 Muscle weakness (generalized): Secondary | ICD-10-CM | POA: Insufficient documentation

## 2022-07-26 DIAGNOSIS — R2681 Unsteadiness on feet: Secondary | ICD-10-CM | POA: Insufficient documentation

## 2022-07-26 NOTE — Therapy (Incomplete)
OUTPATIENT PHYSICAL THERAPY TREATMENT NOTE   Patient Name: [name] MRN: [id number] DOB:1956-04-18, 66 y.o., female Today's Date: 07/26/2022  PCP: [name], NP  REFERRING PROVIDER: Thurnell Lose, MD   END OF SESSION:     Past Medical History:  Diagnosis Date   Acute on chronic systolic CHF (congestive heart failure) ([place]) 12/21/2020   Allergic rhinitis    Arthritis    Asthma    Chronic diastolic CHF (congestive heart failure) ([facility])    COPD (chronic obstructive pulmonary disease) ([place])    Depression    DM (diabetes mellitus) ([place])    DVT (deep vein thrombosis) in pregnancy    GERD (gastroesophageal reflux disease)    HTN (hypertension)    Hyperlipidemia    Obesity    Pneumonia 04/2018   RIGHT LOBE   Sleep apnea    compliant with CPAP   Past Surgical History:  Procedure Laterality Date   ABDOMINAL HYSTERECTOMY     AV FISTULA PLACEMENT Left 03/17/2022   Procedure: LEFT ARM ARTERIOVENOUS (AV) FISTULA CREATION;  Surgeon: Angelia Mould, MD;  Location: [place];  Service: Vascular;  Laterality: Left;   BUBBLE STUDY  11/26/2020   Procedure: BUBBLE STUDY;  Surgeon: Buford Dresser, MD;  Location: [place];  Service: Cardiovascular;;   CARDIOVERSION N/A 05/06/2020   Procedure: CARDIOVERSION;  Surgeon: Buford Dresser, MD;  Location: [place];  Service: Cardiovascular;  Laterality: N/A;   CARDIOVERSION N/A 11/04/2020   Procedure: CARDIOVERSION;  Surgeon: Lelon Perla, MD;  Location: [place];  Service: Cardiovascular;  Laterality: N/A;   CARDIOVERSION N/A 02/01/2021   Procedure: CARDIOVERSION;  Surgeon: Jolaine Artist, MD;  Location: [place];  Service: Cardiovascular;  Laterality: N/A;   CARDIOVERSION N/A 02/09/2021   Procedure: CARDIOVERSION;  Surgeon: Jolaine Artist, MD;  Location: [place];  Service: Cardiovascular;  Laterality: N/A;   CARDIOVERSION N/A 04/19/2021   Procedure: CARDIOVERSION;  Surgeon: Fay Records, MD;  Location: [place];  Service: Cardiovascular;  Laterality: N/A;   CARDIOVERSION N/A 11/25/2021   Procedure: CARDIOVERSION;  Surgeon: Jolaine Artist, MD;  Location: [place];  Service: Cardiovascular;  Laterality: N/A;   CARDIOVERSION N/A 01/31/2022   Procedure: CARDIOVERSION;  Surgeon: Jolaine Artist, MD;  Location: [place];  Service: Cardiovascular;  Laterality: N/A;   CATARACT EXTRACTION, BILATERAL     CHOLECYSTECTOMY     COLONOSCOPY WITH PROPOFOL N/A 03/05/2015   Procedure: COLONOSCOPY WITH PROPOFOL;  Surgeon: Carol Ada, MD;  Location: [facility];  Service: Endoscopy;  Laterality: N/A;   DIAGNOSTIC LAPAROSCOPY     FISTULA SUPERFICIALIZATION Left 03/17/2022   Procedure: FISTULA SUPERFICIALIZATION -LEFT;  Surgeon: Angelia Mould, MD;  Location: [facility];  Service: Vascular;  Laterality: Left;   ingrown hallux Left    IR FLUORO GUIDE CV LINE RIGHT  03/14/2022   IR US GUIDE VASC ACCESS RIGHT  03/14/2022   KNEE SURGERY     LEFT HEART CATH AND CORONARY ANGIOGRAPHY N/A 12/31/2017   Procedure: LEFT HEART CATH AND CORONARY ANGIOGRAPHY;  Surgeon: Charolette Forward, MD;  Location: [place] [facility];  Service: Cardiovascular;  Laterality: N/A;   MAZE N/A 12/29/2020   Procedure: MAZE;  Surgeon: [name], MD;  Location: [place];  Service: Open Heart Surgery;  Laterality: N/A;   MITRAL VALVE REPAIR N/A 12/29/2020   Procedure: MITRAL VALVE REPAIR USING CARBOMEDICS ANNULOFLEX RING SIZE 30;  Surgeon: [name], MD;  Location: [place];  Service: Open Heart Surgery;  Laterality: N/A;   RIGHT HEART CATH N/A 12/22/2020   Procedure: RIGHT HEART CATH;  Surgeon: Larey Dresser, MD;  Location: [place] [facility];  Service: Cardiovascular;  Laterality: N/A;   RIGHT HEART CATH N/A 01/31/2021   Procedure: RIGHT HEART CATH;  Surgeon: Jolaine Artist, MD;  Location: [place] [facility];  Service: Cardiovascular;  Laterality: N/A;   RIGHT HEART CATH N/A  07/29/2021   Procedure: RIGHT HEART CATH;  Surgeon: Jolaine Artist, MD;  Location: [place] [facility];  Service: Cardiovascular;  Laterality: N/A;   RIGHT HEART CATH N/A 11/24/2021   Procedure: RIGHT HEART CATH;  Surgeon: Jolaine Artist, MD;  Location: [place] [facility];  Service: Cardiovascular;  Laterality: N/A;   RIGHT HEART CATH N/A 02/14/2022   Procedure: RIGHT HEART CATH;  Surgeon: Jolaine Artist, MD;  Location: [place] [facility];  Service: Cardiovascular;  Laterality: N/A;   RIGHT HEART CATH N/A 02/22/2022   Procedure: RIGHT HEART CATH;  Surgeon: Larey Dresser, MD;  Location: [place] [facility];  Service: Cardiovascular;  Laterality: N/A;   TEE WITHOUT CARDIOVERSION N/A 11/26/2020   Procedure: TRANSESOPHAGEAL ECHOCARDIOGRAM (TEE);  Surgeon: Buford Dresser, MD;  Location: [facility];  Service: Cardiovascular;  Laterality: N/A;   TEE WITHOUT CARDIOVERSION N/A 12/29/2020   Procedure: TRANSESOPHAGEAL ECHOCARDIOGRAM (TEE);  Surgeon: [name], MD;  Location: [place];  Service: Open Heart Surgery;  Laterality: N/A;   TEE WITHOUT CARDIOVERSION N/A 01/20/2022   Procedure: TRANSESOPHAGEAL ECHOCARDIOGRAM (TEE);  Surgeon: Jolaine Artist, MD;  Location: [place];  Service: Cardiovascular;  Laterality: N/A;   TRICUSPID VALVE REPLACEMENT N/A 12/29/2020   Procedure: 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 RING SIZE 34;  Surgeon: [name], MD;  Location: [place];  Service: Open Heart Surgery;  Laterality: N/A;   Patient Active Problem List   Diagnosis Date Noted   HCAP (healthcare-associated pneumonia) 06/21/2022   ESRD on dialysis ([place]) 06/21/2022   OSA on CPAP 06/21/2022   Acute respiratory failure with hypoxia ([place])    Acute hypoxic respiratory failure ([place]) 06/20/2022   Acute kidney injury superimposed on chronic kidney disease ([place]) 03/18/2022   Class 3 obesity ([place]) 03/18/2022   Depression 01/21/2022   Chronic deep vein thrombosis  (DVT) of other vein of right upper extremity ([place]) 12/29/2021   Abnormal weight loss 11/16/2021   Personal history of colonic polyps 11/16/2021   Rib pain 11/16/2021   Anxiety and depression 08/26/2021   Essential hypertension 08/26/2021   GERD (gastroesophageal reflux disease) 08/26/2021   COPD (chronic obstructive pulmonary disease) ([place]) 07/29/2021   Biventricular heart failure ([place])    Atrial flutter ([place])    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   Encounter for preoperative dental examination    Teeth missing    Gingivitis    Accretions on teeth    Atrial fibrillation, permanent ([place]) 11/02/2020   Acute on chronic right heart failure ([place]) 05/06/2020   NICM (nonischemic cardiomyopathy) ([place]) 04/01/2020   Family history of heart disease 04/01/2020   Mitral regurgitation 04/01/2020   Acute on chronic left systolic heart failure ([place]) 07/01/2018   Acute on chronic diastolic CHF (congestive heart failure) ([place]) 07/01/2018   Chronic atrial fibrillation ([place]) 16/60/6301   Diastolic dysfunction 60/05/9322   Diabetes mellitus type 2 in obese ([place]) 12/26/2017   Hypoxia 09/21/2011   Type 2 diabetes mellitus with hyperlipidemia ([place]) 02/04/2010   AF (paroxysmal atrial fibrillation) (  [place]) 02/04/2010   Gastroparesis 02/04/2010    REFERRING DIAG: R53.81 (ICD-10-CM) - Physical deconditioning   THERAPY DIAG:  No diagnosis found.  Rationale for Evaluation and Treatment Rehabilitation  PERTINENT HISTORY: HTN, Depression, DM II, Gastroparesis CHF   PRECAUTIONS: Fall  SUBJECTIVE:                                                                                                                                                                                      SUBJECTIVE STATEMENT:  ***   PAIN:  Are you having pain? Yes: NPRS scale: 8/10 Pain location: lower back, thighs, knees Pain description: dull ache Aggravating factors: prolonged  sitting Relieving factors: heat   OBJECTIVE: (objective measures completed at initial evaluation unless otherwise dated)   DIAGNOSTIC FINDINGS: See imaging for recent CT and MRI   PATIENT SURVEYS:  FOTO: 45% function; 55% predicted   COGNITION: Overall cognitive status: Within functional limits for tasks assessed                         SENSATION: WFL   EDEMA:  DNT   POSTURE:  large body habitus, rounded shoulders   PALPATION: Slight TTP to lumbar paraspinals   LOWER EXTREMITY MMT:   MMT Right eval Left eval  Hip flexion 3+/5 3+/5  Hip extension      Hip abduction 3+/5 3+/5  Hip adduction 3+/5 3+/5  Hip internal rotation      Hip external rotation      Knee flexion      Knee extension      Ankle dorsiflexion      Ankle plantarflexion      Ankle inversion      Ankle eversion       (Blank rows = not tested)   LOWER EXTREMITY SPECIAL TESTS:  DNT   FUNCTIONAL TESTS:  5 times sit to stand: 20 Timed up and go (TUG): 20 with SPC   GAIT: Distance walked: 31f Assistive device utilized: Single point cane Level of assistance: Modified independence Comments: slowed gait, wide BoS   TREATMENT: [facility] Adult PT Treatment:                                                DATE: 07/26/2022 Therapeutic Exercise: Nustep level 5 x 5 mins while gathering subjective STS x10, with 5# KB x5 (pt reports SOB) Supine clamshells BlueTB 2x10 Supine marching BlueTB 2x10 BIL Supine ball squeeze 5" hold 2x10 Standing hip abduction with UE support x10  BIL Neuromuscular re-ed: FT stance x30" Semi tandem stance x30" BIL (LOB with Lt in back) FT with head turns x30"  [facility] Adult PT Treatment:                                                DATE: 07/19/2022 Therapeutic Exercise: Nustep level 5 x 5 mins while gathering subjective STS x10, with 5# KB x5 (pt reports SOB) Supine clamshells BlueTB 2x10 Supine marching BlueTB 2x10 BIL Supine ball squeeze 5" hold 2x10 Standing hip  abduction with UE support x10 BIL Neuromuscular re-ed: FT stance x30" Semi tandem stance x30" BIL (LOB with Lt in back) FT with head turns x30"   [facility] Adult PT Treatment:                                                DATE: 07/03/2022 Therapeutic Exercise: Sit to Stand 2x10 Seated ball squeeze 2x10 - 5" hold Seated clamshell 2x15 - Blue Theraband Seated march 2x20 - Blue Theraband   PATIENT EDUCATION:  Education details: eval findings, FOTO, HEP, POC Person educated: Patient Education method: Explanation, Demonstration, and Handouts Education comprehension: verbalized understanding and returned demonstration   HOME EXERCISE PROGRAM: Sit to Stand 2x10 - 2x/day Seated ball squeeze 2x10 - 5" hold  - 2x/day Seated clamshell 2x15 - Blue Theraband  - 2x/day Seated march 2x20 - Blue Theraband - 2x/day   ASSESSMENT:   CLINICAL IMPRESSION: ***    OBJECTIVE IMPAIRMENTS: Abnormal gait, decreased activity tolerance, decreased balance, decreased endurance, decreased mobility, difficulty walking, decreased strength, and pain.    ACTIVITY LIMITATIONS: carrying, lifting, standing, squatting, stairs, transfers, bed mobility, bathing, and dressing   PARTICIPATION LIMITATIONS: meal prep, cleaning, driving, shopping, community activity, and yard work   PERSONAL FACTORS: Fitness and 3+ comorbidities: HTN, Depression, DM II, Gastroparesis CHF  are also affecting patient's functional outcome.    REHAB POTENTIAL: Good   CLINICAL DECISION MAKING: Evolving/moderate complexity   EVALUATION COMPLEXITY: Moderate     GOALS: Goals reviewed with patient? No   SHORT TERM GOALS: Target date: 07/24/2022  Pt will be compliant and knowledgeable with initial HEP for improved comfort and carryover Baseline: initial HEP given  Goal status: INITIAL   LONG TERM GOALS: Target date: 08/14/2022    Pt will improve FOTO function score to no less than 55% as proxy for functional improvement Baseline: 45%  function Goal status: INITIAL    2.  Pt will decrease Five Times Sit to Stand to no greater than 14 seconds for improved balance and functional mobility Baseline: 20 seconds Goal status: INITIAL   3.  Pt will decrease TUG with LRAD to no greater than 14 seconds for improved balance and functional mobility Baseline: 20 seconds with SPC Goal status: INITIAL   4.  Pt will increase all LE MMT to no less than 4/5 for improved strength and function Baseline: see cahrt Goal status: INITIAL   PLAN:   PT FREQUENCY: 1-2x/week   PT DURATION: 6 weeks   PLANNED INTERVENTIONS: Therapeutic exercises, Therapeutic activity, Neuromuscular re-education, Balance training, Gait training, Patient/Family education, Self Care, Joint mobilization, Aquatic Therapy, Electrical stimulation, Manual therapy, and Re-evaluation   PLAN FOR NEXT SESSION: assess HEP response, supine strengthening, progress  as able   Ward Chatters, PT 07/26/2022, 8:30 AM

## 2022-07-26 NOTE — Telephone Encounter (Signed)
PT called and spoke with patient regarding missed appointment on 07/26/2022.  Patient stated she has been sick and forgot to cancel appointment. Reminded her of attendance policy and date/time of next appointment.  Ward Chatters   07/26/22 10:18 AM

## 2022-07-28 ENCOUNTER — Other Ambulatory Visit (INDEPENDENT_AMBULATORY_CARE_PROVIDER_SITE_OTHER): Payer: Self-pay | Admitting: Primary Care

## 2022-07-28 MED ORDER — COLCHICINE 0.6 MG PO TABS: ORAL_TABLET | ORAL | 0 refills | Status: DC

## 2022-07-28 NOTE — Telephone Encounter (Signed)
Medication Refill - Medication:  colchicine 0.6 MG tablet   Has the patient contacted their pharmacy? No.  Preferred Pharmacy (with phone number or street name):  [place], [address] Phone: [phone]  Fax: [phone]     Has the patient been seen for an appointment in the last year OR does the patient have an upcoming appointment? Yes.    Agent: Please be advised that RX refills may take up to 3 business days. We ask that you follow-up with your pharmacy.

## 2022-07-28 NOTE — Telephone Encounter (Signed)
Requested medication (s) are due for refill today - yes  Requested medication (s) are on the active medication list -yes  Future visit scheduled -yes  Last refill: 04/03/22 #45  Notes to clinic: abnormal lab value- sent for provider review   Requested Prescriptions  Pending Prescriptions Disp Refills   colchicine 0.6 MG tablet 45 tablet 0     Endocrinology:  Gout Agents - colchicine Failed - 07/28/2022  1:15 PM      Failed - Cr in normal range and within 360 days    Creatinine, Ser  Date Value Ref Range Status  06/28/2022 8.80 (H) 0.44 - 1.00 mg/dL Final   Creatinine, Urine  Date Value Ref Range Status  07/29/2021 97.28 mg/dL Final    Comment:    Performed at [facility], [place] [address] [place], [place] [address]         Passed - ALT in normal range and within 360 days    ALT  Date Value Ref Range Status  06/28/2022 27 0 - 44 U/L Final         Passed - AST in normal range and within 360 days    AST  Date Value Ref Range Status  06/28/2022 30 15 - 41 U/L Final         Passed - Valid encounter within last 12 months    Recent Outpatient Visits           3 weeks ago Constipation, unspecified constipation type   [facility] [name], NP   2 months ago Cough productive of purulent sputum   [facility] [name], NP   2 months ago Adjustment disorder with mixed anxiety and depressed mood   [facility] [name], NP   7 months ago Acute frontal sinusitis, recurrence not specified   [place] [name], NP   10 months ago Hospital discharge follow-up   [place] [name], NP       Future Appointments             In 3 days Buford Dresser, MD [facility]            Passed - CBC within normal limits and completed in the last 12 months    WBC  Date  Value Ref Range Status  06/28/2022 5.5 4.0 - 10.5 K/uL Final   RBC  Date Value Ref Range Status  06/28/2022 3.42 (L) 3.87 - 5.11 MIL/uL Final   Hemoglobin  Date Value Ref Range Status  06/28/2022 11.2 (L) 12.0 - 15.0 g/dL Final  04/14/2021 10.5 (L) 11.1 - 15.9 g/dL Final   Total hemoglobin  Date Value Ref Range Status  01/11/2021 9.6 (L) 12.0 - 16.0 g/dL Final   HCT  Date Value Ref Range Status  06/28/2022 33.0 (L) 36.0 - 46.0 % Final   Hematocrit  Date Value Ref Range Status  04/14/2021 32.5 (L) 34.0 - 46.6 % Final   MCHC  Date Value Ref Range Status  06/28/2022 32.6 30.0 - 36.0 g/dL Final   [facility]  Date Value Ref Range Status  06/28/2022 31.3 26.0 - 34.0 pg Final   MCV  Date Value Ref Range Status  06/28/2022 95.9 80.0 - 100.0 fL Final  04/14/2021 85 79 - 97 fL Final   No results found for: "PLTCOUNTKUC", "LABPLAT", "POCPLA" RDW  Date Value Ref  Range Status  06/28/2022 15.3 11.5 - 15.5 % Final  04/14/2021 15.3 11.7 - 15.4 % Final            Requested Prescriptions  Pending Prescriptions Disp Refills   colchicine 0.6 MG tablet 45 tablet 0     Endocrinology:  Gout Agents - colchicine Failed - 07/28/2022  1:15 PM      Failed - Cr in normal range and within 360 days    Creatinine, Ser  Date Value Ref Range Status  06/28/2022 8.80 (H) 0.44 - 1.00 mg/dL Final   Creatinine, Urine  Date Value Ref Range Status  07/29/2021 97.28 mg/dL Final    Comment:    Performed at [facility], [place] [address] [place], [place] [address]         Passed - ALT in normal range and within 360 days    ALT  Date Value Ref Range Status  06/28/2022 27 0 - 44 U/L Final         Passed - AST in normal range and within 360 days    AST  Date Value Ref Range Status  06/28/2022 30 15 - 41 U/L Final         Passed - Valid encounter within last 12 months    Recent Outpatient Visits           3 weeks ago Constipation, unspecified constipation type   [facility] [name], NP   2 months ago Cough productive of purulent sputum   [facility] [name], NP   2 months ago Adjustment disorder with mixed anxiety and depressed mood   [facility] [name], NP   7 months ago Acute frontal sinusitis, recurrence not specified   [place] [name], NP   10 months ago Hospital discharge follow-up   [place] [name], NP       Future Appointments             In 3 days Buford Dresser, MD [facility]            Passed - CBC within normal limits and completed in the last 12 months    WBC  Date Value Ref Range Status  06/28/2022 5.5 4.0 - 10.5 K/uL Final   RBC  Date Value Ref Range Status  06/28/2022 3.42 (L) 3.87 - 5.11 MIL/uL Final   Hemoglobin  Date Value Ref Range Status  06/28/2022 11.2 (L) 12.0 - 15.0 g/dL Final  04/14/2021 10.5 (L) 11.1 - 15.9 g/dL Final   Total hemoglobin  Date Value Ref Range Status  01/11/2021 9.6 (L) 12.0 - 16.0 g/dL Final   HCT  Date Value Ref Range Status  06/28/2022 33.0 (L) 36.0 - 46.0 % Final   Hematocrit  Date Value Ref Range Status  04/14/2021 32.5 (L) 34.0 - 46.6 % Final   MCHC  Date Value Ref Range Status  06/28/2022 32.6 30.0 - 36.0 g/dL Final   [facility]  Date Value Ref Range Status  06/28/2022 31.3 26.0 - 34.0 pg Final   MCV  Date Value Ref Range Status  06/28/2022 95.9 80.0 - 100.0 fL Final  04/14/2021 85 79 - 97 fL Final   No results found for: "PLTCOUNTKUC", "LABPLAT", "POCPLA" RDW  Date Value Ref Range Status  06/28/2022 15.3 11.5 - 15.5 % Final  04/14/2021 15.3 11.7 -  15.4 % Final

## 2022-07-30 ENCOUNTER — Other Ambulatory Visit: Payer: Self-pay | Admitting: Family Medicine

## 2022-07-31 ENCOUNTER — Ambulatory Visit (HOSPITAL_BASED_OUTPATIENT_CLINIC_OR_DEPARTMENT_OTHER): Payer: 59 | Admitting: Cardiology

## 2022-08-02 ENCOUNTER — Ambulatory Visit: Payer: Medicare Other

## 2022-08-02 DIAGNOSIS — M6281 Muscle weakness (generalized): Secondary | ICD-10-CM | POA: Diagnosis present

## 2022-08-02 DIAGNOSIS — R2689 Other abnormalities of gait and mobility: Secondary | ICD-10-CM

## 2022-08-02 DIAGNOSIS — R2681 Unsteadiness on feet: Secondary | ICD-10-CM | POA: Diagnosis present

## 2022-08-02 NOTE — Therapy (Addendum)
OUTPATIENT PHYSICAL THERAPY TREATMENT NOTE/DISCHARGE  PHYSICAL THERAPY DISCHARGE SUMMARY  Visits from Start of Care: 3  Current functional level related to goals / functional outcomes: Unable to assess   Remaining deficits: Unable to assess   Education / Equipment: HEP   Patient agrees to discharge. Patient goals were  unable to assess . Patient is being discharged due to not returning since the last visit.   Patient Name: [name] MRN: [id number] DOB:1955-11-27, 66 y.o., female 53 Date: 08/02/2022  PCP: [name], NP  REFERRING PROVIDER: Thurnell Lose, MD   END OF SESSION:   PT End of Session - 08/02/22 0906     Visit Number 3    Number of Visits 13    Date for PT Re-Evaluation 08/14/22    Authorization Type [employer] Medicare    PT Start Time 0915    PT Stop Time 0955    PT Time Calculation (min) 40 min    Activity Tolerance Patient tolerated treatment well    Behavior During Therapy [facility] for tasks assessed/performed              Past Medical History:  Diagnosis Date   Acute on chronic systolic CHF (congestive heart failure) ([place]) 12/21/2020   Allergic rhinitis    Arthritis    Asthma    Chronic diastolic CHF (congestive heart failure) ([facility])    COPD (chronic obstructive pulmonary disease) ([place])    Depression    DM (diabetes mellitus) ([place])    DVT (deep vein thrombosis) in pregnancy    GERD (gastroesophageal reflux disease)    HTN (hypertension)    Hyperlipidemia    Obesity    Pneumonia 04/2018   RIGHT LOBE   Sleep apnea    compliant with CPAP   Past Surgical History:  Procedure Laterality Date   ABDOMINAL HYSTERECTOMY     AV FISTULA PLACEMENT Left 03/17/2022   Procedure: LEFT ARM ARTERIOVENOUS (AV) FISTULA CREATION;  Surgeon: Angelia Mould, MD;  Location: [place];  Service: Vascular;  Laterality: Left;   BUBBLE STUDY  11/26/2020   Procedure: BUBBLE STUDY;  Surgeon: Buford Dresser, MD;  Location: [place];   Service: Cardiovascular;;   CARDIOVERSION N/A 05/06/2020   Procedure: CARDIOVERSION;  Surgeon: Buford Dresser, MD;  Location: [place];  Service: Cardiovascular;  Laterality: N/A;   CARDIOVERSION N/A 11/04/2020   Procedure: CARDIOVERSION;  Surgeon: Lelon Perla, MD;  Location: [place];  Service: Cardiovascular;  Laterality: N/A;   CARDIOVERSION N/A 02/01/2021   Procedure: CARDIOVERSION;  Surgeon: Jolaine Artist, MD;  Location: [place];  Service: Cardiovascular;  Laterality: N/A;   CARDIOVERSION N/A 02/09/2021   Procedure: CARDIOVERSION;  Surgeon: Jolaine Artist, MD;  Location: [place];  Service: Cardiovascular;  Laterality: N/A;   CARDIOVERSION N/A 04/19/2021   Procedure: CARDIOVERSION;  Surgeon: Fay Records, MD;  Location: [place];  Service: Cardiovascular;  Laterality: N/A;   CARDIOVERSION N/A 11/25/2021   Procedure: CARDIOVERSION;  Surgeon: Jolaine Artist, MD;  Location: [place];  Service: Cardiovascular;  Laterality: N/A;   CARDIOVERSION N/A 01/31/2022   Procedure: CARDIOVERSION;  Surgeon: Jolaine Artist, MD;  Location: [place];  Service: Cardiovascular;  Laterality: N/A;   CATARACT EXTRACTION, BILATERAL     CHOLECYSTECTOMY     COLONOSCOPY WITH PROPOFOL N/A 03/05/2015   Procedure: COLONOSCOPY WITH PROPOFOL;  Surgeon: Carol Ada, MD;  Location: [facility];  Service: Endoscopy;  Laterality: N/A;   DIAGNOSTIC LAPAROSCOPY     FISTULA SUPERFICIALIZATION Left  03/17/2022   Procedure: FISTULA SUPERFICIALIZATION -LEFT;  Surgeon: Angelia Mould, MD;  Location: [facility] OR;  Service: Vascular;  Laterality: Left;   ingrown hallux Left    IR FLUORO GUIDE CV LINE RIGHT  03/14/2022   IR US GUIDE VASC ACCESS RIGHT  03/14/2022   KNEE SURGERY     LEFT HEART CATH AND CORONARY ANGIOGRAPHY N/A 12/31/2017   Procedure: LEFT HEART CATH AND CORONARY ANGIOGRAPHY;  Surgeon: Charolette Forward, MD;  Location: [place] CV [facility];  Service: Cardiovascular;   Laterality: N/A;   MAZE N/A 12/29/2020   Procedure: MAZE;  Surgeon: [name], MD;  Location: [place];  Service: Open Heart Surgery;  Laterality: N/A;   MITRAL VALVE REPAIR N/A 12/29/2020   Procedure: MITRAL VALVE REPAIR USING CARBOMEDICS ANNULOFLEX RING SIZE 30;  Surgeon: [name], MD;  Location: [place];  Service: Open Heart Surgery;  Laterality: N/A;   RIGHT HEART CATH N/A 12/22/2020   Procedure: RIGHT HEART CATH;  Surgeon: Larey Dresser, MD;  Location: [place] [facility];  Service: Cardiovascular;  Laterality: N/A;   RIGHT HEART CATH N/A 01/31/2021   Procedure: RIGHT HEART CATH;  Surgeon: Jolaine Artist, MD;  Location: [place] [facility];  Service: Cardiovascular;  Laterality: N/A;   RIGHT HEART CATH N/A 07/29/2021   Procedure: RIGHT HEART CATH;  Surgeon: Jolaine Artist, MD;  Location: [place] [facility];  Service: Cardiovascular;  Laterality: N/A;   RIGHT HEART CATH N/A 11/24/2021   Procedure: RIGHT HEART CATH;  Surgeon: Jolaine Artist, MD;  Location: [place] [facility];  Service: Cardiovascular;  Laterality: N/A;   RIGHT HEART CATH N/A 02/14/2022   Procedure: RIGHT HEART CATH;  Surgeon: Jolaine Artist, MD;  Location: [place] [facility];  Service: Cardiovascular;  Laterality: N/A;   RIGHT HEART CATH N/A 02/22/2022   Procedure: RIGHT HEART CATH;  Surgeon: Larey Dresser, MD;  Location: [place] [facility];  Service: Cardiovascular;  Laterality: N/A;   TEE WITHOUT CARDIOVERSION N/A 11/26/2020   Procedure: TRANSESOPHAGEAL ECHOCARDIOGRAM (TEE);  Surgeon: Buford Dresser, MD;  Location: [facility] ENDOSCOPY;  Service: Cardiovascular;  Laterality: N/A;   TEE WITHOUT CARDIOVERSION N/A 12/29/2020   Procedure: TRANSESOPHAGEAL ECHOCARDIOGRAM (TEE);  Surgeon: [name], MD;  Location: [place];  Service: Open Heart Surgery;  Laterality: N/A;   TEE WITHOUT CARDIOVERSION N/A 01/20/2022   Procedure: TRANSESOPHAGEAL ECHOCARDIOGRAM (TEE);  Surgeon: Jolaine Artist,  MD;  Location: [place];  Service: Cardiovascular;  Laterality: N/A;   TRICUSPID VALVE REPLACEMENT N/A 12/29/2020   Procedure: 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 RING SIZE 34;  Surgeon: [name], MD;  Location: [place];  Service: Open Heart Surgery;  Laterality: N/A;   Patient Active Problem List   Diagnosis Date Noted   HCAP (healthcare-associated pneumonia) 06/21/2022   ESRD on dialysis ([place]) 06/21/2022   OSA on CPAP 06/21/2022   Acute respiratory failure with hypoxia ([place])    Acute hypoxic respiratory failure ([place]) 06/20/2022   Acute kidney injury superimposed on chronic kidney disease ([place]) 03/18/2022   Class 3 obesity ([place]) 03/18/2022   Depression 01/21/2022   Chronic deep vein thrombosis (DVT) of other vein of right upper extremity ([place]) 12/29/2021   Abnormal weight loss 11/16/2021   Personal history of colonic polyps 11/16/2021   Rib pain 11/16/2021   Anxiety and depression 08/26/2021   Essential hypertension 08/26/2021   GERD (gastroesophageal reflux disease) 08/26/2021   COPD (chronic obstructive pulmonary disease) ([place])  07/29/2021   Biventricular heart failure ([place])    Atrial flutter ([place])    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   Encounter for preoperative dental examination    Teeth missing    Gingivitis    Accretions on teeth    Atrial fibrillation, permanent ([place]) 11/02/2020   Acute on chronic right heart failure ([place]) 05/06/2020   NICM (nonischemic cardiomyopathy) ([place]) 04/01/2020   Family history of heart disease 04/01/2020   Mitral regurgitation 04/01/2020   Acute on chronic left systolic heart failure ([place]) 07/01/2018   Acute on chronic diastolic CHF (congestive heart failure) ([place]) 07/01/2018   Chronic atrial fibrillation ([place]) [id number]   Diastolic dysfunction Q000111Q   Diabetes mellitus type 2 in obese ([place]) 12/26/2017   Hypoxia 09/21/2011   Type 2 diabetes mellitus with hyperlipidemia  ([place]) 02/04/2010   AF (paroxysmal atrial fibrillation) ([place]) 02/04/2010   Gastroparesis 02/04/2010    REFERRING DIAG: R53.81 (ICD-10-CM) - Physical deconditioning   THERAPY DIAG:  Muscle weakness (generalized)  Other abnormalities of gait and mobility  Unsteadiness on feet  Rationale for Evaluation and Treatment Rehabilitation  PERTINENT HISTORY: HTN, Depression, DM II, Gastroparesis CHF   PRECAUTIONS: Fall  SUBJECTIVE:                                                                                                                                                                                      SUBJECTIVE STATEMENT:  Pt presents to PT with reports of decreased knee pain. Has been compliant with HEP. Pt is ready to begin PT at this time.    PAIN:  Are you having pain?  Yes: NPRS scale: 6/10 Pain location: lower back, thighs, knees Pain description: dull ache Aggravating factors: prolonged sitting Relieving factors: heat   OBJECTIVE: (objective measures completed at initial evaluation unless otherwise dated)   DIAGNOSTIC FINDINGS: See imaging for recent CT and MRI   PATIENT SURVEYS:  FOTO: 45% function; 55% predicted   COGNITION: Overall cognitive status: Within functional limits for tasks assessed                         SENSATION: WFL   EDEMA:  DNT   POSTURE:  large body habitus, rounded shoulders   PALPATION: Slight TTP to lumbar paraspinals   LOWER EXTREMITY MMT:   MMT Right eval Left eval  Hip flexion 3+/5 3+/5  Hip extension      Hip abduction 3+/5 3+/5  Hip adduction 3+/5 3+/5  Hip internal rotation      Hip external rotation      Knee flexion  Knee extension      Ankle dorsiflexion      Ankle plantarflexion      Ankle inversion      Ankle eversion       (Blank rows = not tested)   LOWER EXTREMITY SPECIAL TESTS:  DNT   FUNCTIONAL TESTS:  5 times sit to stand: 20 Timed up and go (TUG): 20 with SPC   GAIT: Distance walked:  37f Assistive device utilized: Single point cane Level of assistance: Modified independence Comments: slowed gait, wide BoS   TREATMENT: [facility] Adult PT Treatment:                                                DATE: 08/02/2022 Therapeutic Exercise: Nustep level 5 x 5 mins while gathering subjective STS 2x10 LAQ 2x10 4# Seated hamstring curl 2x10 BTB Standing hip abd/ext 2x10 each  [facility] Adult PT Treatment:                                                DATE: 07/19/2022 Therapeutic Exercise: Nustep level 5 x 5 mins while gathering subjective STS x10, with 5# KB x5 (pt reports SOB) Supine clamshells BlueTB 2x10 Supine marching BlueTB 2x10 BIL Supine ball squeeze 5" hold 2x10 Standing hip abduction with UE support x10 BIL Neuromuscular re-ed: FT stance x30" Semi tandem stance x30" BIL (LOB with Lt in back) FT with head turns x30"   [facility] Adult PT Treatment:                                                DATE: 07/03/2022 Therapeutic Exercise: Sit to Stand 2x10 Seated ball squeeze 2x10 - 5" hold Seated clamshell 2x15 - Blue Theraband Seated march 2x20 - Blue Theraband   PATIENT EDUCATION:  Education details: continue HEP Person educated: Patient Education method: EConsulting civil engineer DMedia planner and Handouts Education comprehension: verbalized understanding and returned demonstration   HOME EXERCISE PROGRAM: Sit to Stand 2x10 - 2x/day Seated ball squeeze 2x10 - 5" hold  - 2x/day Seated clamshell 2x15 - Blue Theraband  - 2x/day Seated march 2x20 - Blue Theraband - 2x/day  Access Code: [email or web address]: [email or web address] Date: 08/02/2022 Prepared by: DOctavio Manns Exercises - Standing Hip Abduction with Counter Support  - 1 x daily - 7 x weekly - 2 sets - 10 reps - Standing Hip Extension with Counter Support  - 1 x daily - 7 x weekly - 2 sets - 10 reps   ASSESSMENT:   CLINICAL IMPRESSION: Pt was able to complete all prescribed exercises with no adverse  effect. Therapy focused on improving LE strength and functional mobility, with emphasis on proximal hip. Pt is progressing with therapy, will continue per POC as prescribed.     OBJECTIVE IMPAIRMENTS: Abnormal gait, decreased activity tolerance, decreased balance, decreased endurance, decreased mobility, difficulty walking, decreased strength, and pain.    ACTIVITY LIMITATIONS: carrying, lifting, standing, squatting, stairs, transfers, bed mobility, bathing, and dressing   PARTICIPATION LIMITATIONS: meal prep, cleaning, driving, shopping, community activity, and yard work   PERSONAL FACTORS: Fitness and 3+ comorbidities: HTN, Depression, DM  II, Gastroparesis CHF  are also affecting patient's functional outcome.      GOALS: Goals reviewed with patient? No   SHORT TERM GOALS: Target date: 07/24/2022  Pt will be compliant and knowledgeable with initial HEP for improved comfort and carryover Baseline: initial HEP given  Goal status: INITIAL   LONG TERM GOALS: Target date: 08/14/2022    Pt will improve FOTO function score to no less than 55% as proxy for functional improvement Baseline: 45% function Goal status: INITIAL    2.  Pt will decrease Five Times Sit to Stand to no greater than 14 seconds for improved balance and functional mobility Baseline: 20 seconds Goal status: INITIAL   3.  Pt will decrease TUG with LRAD to no greater than 14 seconds for improved balance and functional mobility Baseline: 20 seconds with SPC Goal status: INITIAL   4.  Pt will increase all LE MMT to no less than 4/5 for improved strength and function Baseline: see cahrt Goal status: INITIAL   PLAN:   PT FREQUENCY: 1-2x/week   PT DURATION: 6 weeks   PLANNED INTERVENTIONS: Therapeutic exercises, Therapeutic activity, Neuromuscular re-education, Balance training, Gait training, Patient/Family education, Self Care, Joint mobilization, Aquatic Therapy, Electrical stimulation, Manual therapy, and  Re-evaluation   PLAN FOR NEXT SESSION: assess HEP response, supine strengthening, progress as able   Ward Chatters, PT 08/02/2022, 9:59 AM

## 2022-08-07 ENCOUNTER — Ambulatory Visit: Payer: Medicare Other

## 2022-08-07 ENCOUNTER — Telehealth: Payer: Self-pay | Admitting: Neurology

## 2022-08-07 ENCOUNTER — Ambulatory Visit: Payer: Medicare Other | Admitting: Neurology

## 2022-08-07 NOTE — Telephone Encounter (Signed)
Cancelled appt due feeling sick, nauseous.

## 2022-08-07 NOTE — Therapy (Incomplete)
OUTPATIENT PHYSICAL THERAPY TREATMENT NOTE   Patient Name: [name] MRN: [id number] DOB:Mar 13, 1956, 66 y.o., female Today's Date: 08/07/2022  PCP: [name], NP  REFERRING PROVIDER: Thurnell Lose, MD   END OF SESSION:      Past Medical History:  Diagnosis Date   Acute on chronic systolic CHF (congestive heart failure) ([place]) 12/21/2020   Allergic rhinitis    Arthritis    Asthma    Chronic diastolic CHF (congestive heart failure) ([facility])    COPD (chronic obstructive pulmonary disease) ([place])    Depression    DM (diabetes mellitus) ([place])    DVT (deep vein thrombosis) in pregnancy    GERD (gastroesophageal reflux disease)    HTN (hypertension)    Hyperlipidemia    Obesity    Pneumonia 04/2018   RIGHT LOBE   Sleep apnea    compliant with CPAP   Past Surgical History:  Procedure Laterality Date   ABDOMINAL HYSTERECTOMY     AV FISTULA PLACEMENT Left 03/17/2022   Procedure: LEFT ARM ARTERIOVENOUS (AV) FISTULA CREATION;  Surgeon: Angelia Mould, MD;  Location: [place];  Service: Vascular;  Laterality: Left;   BUBBLE STUDY  11/26/2020   Procedure: BUBBLE STUDY;  Surgeon: Buford Dresser, MD;  Location: [place];  Service: Cardiovascular;;   CARDIOVERSION N/A 05/06/2020   Procedure: CARDIOVERSION;  Surgeon: Buford Dresser, MD;  Location: [place];  Service: Cardiovascular;  Laterality: N/A;   CARDIOVERSION N/A 11/04/2020   Procedure: CARDIOVERSION;  Surgeon: Lelon Perla, MD;  Location: [place];  Service: Cardiovascular;  Laterality: N/A;   CARDIOVERSION N/A 02/01/2021   Procedure: CARDIOVERSION;  Surgeon: Jolaine Artist, MD;  Location: [place];  Service: Cardiovascular;  Laterality: N/A;   CARDIOVERSION N/A 02/09/2021   Procedure: CARDIOVERSION;  Surgeon: Jolaine Artist, MD;  Location: [place];  Service: Cardiovascular;  Laterality: N/A;   CARDIOVERSION N/A 04/19/2021   Procedure: CARDIOVERSION;  Surgeon:  Fay Records, MD;  Location: [place];  Service: Cardiovascular;  Laterality: N/A;   CARDIOVERSION N/A 11/25/2021   Procedure: CARDIOVERSION;  Surgeon: Jolaine Artist, MD;  Location: [place];  Service: Cardiovascular;  Laterality: N/A;   CARDIOVERSION N/A 01/31/2022   Procedure: CARDIOVERSION;  Surgeon: Jolaine Artist, MD;  Location: [place];  Service: Cardiovascular;  Laterality: N/A;   CATARACT EXTRACTION, BILATERAL     CHOLECYSTECTOMY     COLONOSCOPY WITH PROPOFOL N/A 03/05/2015   Procedure: COLONOSCOPY WITH PROPOFOL;  Surgeon: Carol Ada, MD;  Location: [facility];  Service: Endoscopy;  Laterality: N/A;   DIAGNOSTIC LAPAROSCOPY     FISTULA SUPERFICIALIZATION Left 03/17/2022   Procedure: FISTULA SUPERFICIALIZATION -LEFT;  Surgeon: Angelia Mould, MD;  Location: [facility] OR;  Service: Vascular;  Laterality: Left;   ingrown hallux Left    IR FLUORO GUIDE CV LINE RIGHT  03/14/2022   IR US GUIDE VASC ACCESS RIGHT  03/14/2022   KNEE SURGERY     LEFT HEART CATH AND CORONARY ANGIOGRAPHY N/A 12/31/2017   Procedure: LEFT HEART CATH AND CORONARY ANGIOGRAPHY;  Surgeon: Charolette Forward, MD;  Location: [place] [facility];  Service: Cardiovascular;  Laterality: N/A;   MAZE N/A 12/29/2020   Procedure: MAZE;  Surgeon: [name], MD;  Location: [place];  Service: Open Heart Surgery;  Laterality: N/A;   MITRAL VALVE REPAIR N/A 12/29/2020   Procedure: MITRAL VALVE REPAIR USING CARBOMEDICS ANNULOFLEX RING SIZE 30;  Surgeon: [name], MD;  Location: [place];  Service: Open Heart Surgery;  Laterality: N/A;   RIGHT HEART CATH N/A 12/22/2020   Procedure: RIGHT HEART CATH;  Surgeon: Larey Dresser, MD;  Location: [place] [facility];  Service: Cardiovascular;  Laterality: N/A;   RIGHT HEART CATH N/A 01/31/2021   Procedure: RIGHT HEART CATH;  Surgeon: Jolaine Artist, MD;  Location: [place] [facility];  Service: Cardiovascular;  Laterality: N/A;   RIGHT HEART CATH N/A  07/29/2021   Procedure: RIGHT HEART CATH;  Surgeon: Jolaine Artist, MD;  Location: [place] [facility];  Service: Cardiovascular;  Laterality: N/A;   RIGHT HEART CATH N/A 11/24/2021   Procedure: RIGHT HEART CATH;  Surgeon: Jolaine Artist, MD;  Location: [place] [facility];  Service: Cardiovascular;  Laterality: N/A;   RIGHT HEART CATH N/A 02/14/2022   Procedure: RIGHT HEART CATH;  Surgeon: Jolaine Artist, MD;  Location: [place] [facility];  Service: Cardiovascular;  Laterality: N/A;   RIGHT HEART CATH N/A 02/22/2022   Procedure: RIGHT HEART CATH;  Surgeon: Larey Dresser, MD;  Location: [place] [facility];  Service: Cardiovascular;  Laterality: N/A;   TEE WITHOUT CARDIOVERSION N/A 11/26/2020   Procedure: TRANSESOPHAGEAL ECHOCARDIOGRAM (TEE);  Surgeon: Buford Dresser, MD;  Location: [facility];  Service: Cardiovascular;  Laterality: N/A;   TEE WITHOUT CARDIOVERSION N/A 12/29/2020   Procedure: TRANSESOPHAGEAL ECHOCARDIOGRAM (TEE);  Surgeon: [name], MD;  Location: [place];  Service: Open Heart Surgery;  Laterality: N/A;   TEE WITHOUT CARDIOVERSION N/A 01/20/2022   Procedure: TRANSESOPHAGEAL ECHOCARDIOGRAM (TEE);  Surgeon: Jolaine Artist, MD;  Location: [place];  Service: Cardiovascular;  Laterality: N/A;   TRICUSPID VALVE REPLACEMENT N/A 12/29/2020   Procedure: 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 RING SIZE 34;  Surgeon: [name], MD;  Location: [place];  Service: Open Heart Surgery;  Laterality: N/A;   Patient Active Problem List   Diagnosis Date Noted   HCAP (healthcare-associated pneumonia) 06/21/2022   ESRD on dialysis ([place]) 06/21/2022   OSA on CPAP 06/21/2022   Acute respiratory failure with hypoxia ([place])    Acute hypoxic respiratory failure ([place]) 06/20/2022   Acute kidney injury superimposed on chronic kidney disease ([place]) 03/18/2022   Class 3 obesity ([place]) 03/18/2022   Depression 01/21/2022   Chronic deep vein thrombosis  (DVT) of other vein of right upper extremity ([place]) 12/29/2021   Abnormal weight loss 11/16/2021   Personal history of colonic polyps 11/16/2021   Rib pain 11/16/2021   Anxiety and depression 08/26/2021   Essential hypertension 08/26/2021   GERD (gastroesophageal reflux disease) 08/26/2021   COPD (chronic obstructive pulmonary disease) ([place]) 07/29/2021   Biventricular heart failure ([place])    Atrial flutter ([place])    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   Encounter for preoperative dental examination    Teeth missing    Gingivitis    Accretions on teeth    Atrial fibrillation, permanent ([place]) 11/02/2020   Acute on chronic right heart failure ([place]) 05/06/2020   NICM (nonischemic cardiomyopathy) ([place]) 04/01/2020   Family history of heart disease 04/01/2020   Mitral regurgitation 04/01/2020   Acute on chronic left systolic heart failure ([place]) 07/01/2018   Acute on chronic diastolic CHF (congestive heart failure) ([place]) 07/01/2018   Chronic atrial fibrillation ([place]) 49/67/5916   Diastolic dysfunction 38/46/6599   Diabetes mellitus type 2 in obese ([place]) 12/26/2017   Hypoxia 09/21/2011   Type 2 diabetes mellitus with hyperlipidemia ([place]) 02/04/2010   AF (paroxysmal atrial fibrillation) (  [place]) 02/04/2010   Gastroparesis 02/04/2010    REFERRING DIAG: R53.81 (ICD-10-CM) - Physical deconditioning   THERAPY DIAG:  No diagnosis found.  Rationale for Evaluation and Treatment Rehabilitation  PERTINENT HISTORY: HTN, Depression, DM II, Gastroparesis CHF   PRECAUTIONS: Fall  SUBJECTIVE:                                                                                                                                                                                      SUBJECTIVE STATEMENT:  ***   PAIN:  Are you having pain?  Yes: NPRS scale: 6/10 Pain location: lower back, thighs, knees Pain description: dull ache Aggravating factors: prolonged  sitting Relieving factors: heat   OBJECTIVE: (objective measures completed at initial evaluation unless otherwise dated)   DIAGNOSTIC FINDINGS: See imaging for recent CT and MRI   PATIENT SURVEYS:  FOTO: 45% function; 55% predicted   COGNITION: Overall cognitive status: Within functional limits for tasks assessed                         SENSATION: WFL   EDEMA:  DNT   POSTURE:  large body habitus, rounded shoulders   PALPATION: Slight TTP to lumbar paraspinals   LOWER EXTREMITY MMT:   MMT Right eval Left eval  Hip flexion 3+/5 3+/5  Hip extension      Hip abduction 3+/5 3+/5  Hip adduction 3+/5 3+/5  Hip internal rotation      Hip external rotation      Knee flexion      Knee extension      Ankle dorsiflexion      Ankle plantarflexion      Ankle inversion      Ankle eversion       (Blank rows = not tested)   LOWER EXTREMITY SPECIAL TESTS:  DNT   FUNCTIONAL TESTS:  5 times sit to stand: 20 Timed up and go (TUG): 20 with SPC   GAIT: Distance walked: 38f Assistive device utilized: Single point cane Level of assistance: Modified independence Comments: slowed gait, wide BoS   TREATMENT: [facility] Adult PT Treatment:                                                DATE: 08/07/2022 Therapeutic Exercise: Nustep level 5 x 5 mins while gathering subjective STS 2x10 LAQ 2x10 4# Seated hamstring curl 2x10 BTB Standing hip abd/ext 2x10 each  [facility] Adult PT Treatment:  DATE: 08/02/2022 Therapeutic Exercise: Nustep level 5 x 5 mins while gathering subjective STS 2x10 LAQ 2x10 4# Seated hamstring curl 2x10 BTB Standing hip abd/ext 2x10 each  [facility] Adult PT Treatment:                                                DATE: 07/19/2022 Therapeutic Exercise: Nustep level 5 x 5 mins while gathering subjective STS x10, with 5# KB x5 (pt reports SOB) Supine clamshells BlueTB 2x10 Supine marching BlueTB 2x10 BIL Supine ball  squeeze 5" hold 2x10 Standing hip abduction with UE support x10 BIL Neuromuscular re-ed: FT stance x30" Semi tandem stance x30" BIL (LOB with Lt in back) FT with head turns x30"   [facility] Adult PT Treatment:                                                DATE: 07/03/2022 Therapeutic Exercise: Sit to Stand 2x10 Seated ball squeeze 2x10 - 5" hold Seated clamshell 2x15 - Blue Theraband Seated march 2x20 - Blue Theraband   PATIENT EDUCATION:  Education details: continue HEP Person educated: Patient Education method: Consulting civil engineer, Media planner, and Handouts Education comprehension: verbalized understanding and returned demonstration   HOME EXERCISE PROGRAM: Sit to Stand 2x10 - 2x/day Seated ball squeeze 2x10 - 5" hold  - 2x/day Seated clamshell 2x15 - Blue Theraband  - 2x/day Seated march 2x20 - Blue Theraband - 2x/day  Access Code: [email or web address] URL: [email or web address] Date: 08/02/2022 Prepared by: Octavio Manns  Exercises - Standing Hip Abduction with Counter Support  - 1 x daily - 7 x weekly - 2 sets - 10 reps - Standing Hip Extension with Counter Support  - 1 x daily - 7 x weekly - 2 sets - 10 reps   ASSESSMENT:   CLINICAL IMPRESSION: ***    OBJECTIVE IMPAIRMENTS: Abnormal gait, decreased activity tolerance, decreased balance, decreased endurance, decreased mobility, difficulty walking, decreased strength, and pain.    ACTIVITY LIMITATIONS: carrying, lifting, standing, squatting, stairs, transfers, bed mobility, bathing, and dressing   PARTICIPATION LIMITATIONS: meal prep, cleaning, driving, shopping, community activity, and yard work   PERSONAL FACTORS: Fitness and 3+ comorbidities: HTN, Depression, DM II, Gastroparesis CHF  are also affecting patient's functional outcome.      GOALS: Goals reviewed with patient? No   SHORT TERM GOALS: Target date: 07/24/2022  Pt will be compliant and knowledgeable with initial HEP for improved comfort and  carryover Baseline: initial HEP given  Goal status: INITIAL   LONG TERM GOALS: Target date: 08/14/2022    Pt will improve FOTO function score to no less than 55% as proxy for functional improvement Baseline: 45% function Goal status: INITIAL    2.  Pt will decrease Five Times Sit to Stand to no greater than 14 seconds for improved balance and functional mobility Baseline: 20 seconds Goal status: INITIAL   3.  Pt will decrease TUG with LRAD to no greater than 14 seconds for improved balance and functional mobility Baseline: 20 seconds with SPC Goal status: INITIAL   4.  Pt will increase all LE MMT to no less than 4/5 for improved strength and function Baseline: see cahrt Goal status: INITIAL   PLAN:   PT FREQUENCY: 1-2x/week  PT DURATION: 6 weeks   PLANNED INTERVENTIONS: Therapeutic exercises, Therapeutic activity, Neuromuscular re-education, Balance training, Gait training, Patient/Family education, Self Care, Joint mobilization, Aquatic Therapy, Electrical stimulation, Manual therapy, and Re-evaluation   PLAN FOR NEXT SESSION: assess HEP response, supine strengthening, progress as able   Ward Chatters, PT 08/07/2022, 7:39 AM

## 2022-08-10 ENCOUNTER — Ambulatory Visit: Payer: Medicare Other

## 2022-08-10 NOTE — Therapy (Incomplete)
OUTPATIENT PHYSICAL THERAPY TREATMENT NOTE   Patient Name: [name] MRN: [id number] DOB:Oct 13, 1955, 66 y.o., female Today's Date: 08/10/2022  PCP: [name], NP  REFERRING PROVIDER: Thurnell Lose, MD   END OF SESSION:      Past Medical History:  Diagnosis Date   Acute on chronic systolic CHF (congestive heart failure) ([place]) 12/21/2020   Allergic rhinitis    Arthritis    Asthma    Chronic diastolic CHF (congestive heart failure) ([facility])    COPD (chronic obstructive pulmonary disease) ([place])    Depression    DM (diabetes mellitus) ([place])    DVT (deep vein thrombosis) in pregnancy    GERD (gastroesophageal reflux disease)    HTN (hypertension)    Hyperlipidemia    Obesity    Pneumonia 04/2018   RIGHT LOBE   Sleep apnea    compliant with CPAP   Past Surgical History:  Procedure Laterality Date   ABDOMINAL HYSTERECTOMY     AV FISTULA PLACEMENT Left 03/17/2022   Procedure: LEFT ARM ARTERIOVENOUS (AV) FISTULA CREATION;  Surgeon: Angelia Mould, MD;  Location: [place];  Service: Vascular;  Laterality: Left;   BUBBLE STUDY  11/26/2020   Procedure: BUBBLE STUDY;  Surgeon: Buford Dresser, MD;  Location: [place];  Service: Cardiovascular;;   CARDIOVERSION N/A 05/06/2020   Procedure: CARDIOVERSION;  Surgeon: Buford Dresser, MD;  Location: [place];  Service: Cardiovascular;  Laterality: N/A;   CARDIOVERSION N/A 11/04/2020   Procedure: CARDIOVERSION;  Surgeon: Lelon Perla, MD;  Location: [place];  Service: Cardiovascular;  Laterality: N/A;   CARDIOVERSION N/A 02/01/2021   Procedure: CARDIOVERSION;  Surgeon: Jolaine Artist, MD;  Location: [place];  Service: Cardiovascular;  Laterality: N/A;   CARDIOVERSION N/A 02/09/2021   Procedure: CARDIOVERSION;  Surgeon: Jolaine Artist, MD;  Location: [place];  Service: Cardiovascular;  Laterality: N/A;   CARDIOVERSION N/A 04/19/2021   Procedure: CARDIOVERSION;  Surgeon:  Fay Records, MD;  Location: [place];  Service: Cardiovascular;  Laterality: N/A;   CARDIOVERSION N/A 11/25/2021   Procedure: CARDIOVERSION;  Surgeon: Jolaine Artist, MD;  Location: [place];  Service: Cardiovascular;  Laterality: N/A;   CARDIOVERSION N/A 01/31/2022   Procedure: CARDIOVERSION;  Surgeon: Jolaine Artist, MD;  Location: [place];  Service: Cardiovascular;  Laterality: N/A;   CATARACT EXTRACTION, BILATERAL     CHOLECYSTECTOMY     COLONOSCOPY WITH PROPOFOL N/A 03/05/2015   Procedure: COLONOSCOPY WITH PROPOFOL;  Surgeon: Carol Ada, MD;  Location: [facility];  Service: Endoscopy;  Laterality: N/A;   DIAGNOSTIC LAPAROSCOPY     FISTULA SUPERFICIALIZATION Left 03/17/2022   Procedure: FISTULA SUPERFICIALIZATION -LEFT;  Surgeon: Angelia Mould, MD;  Location[facility];  Service: Vascular;  Laterality: Left;   ingrown hallux Left    IR FLUORO GUIDE CV LINE RIGHT  03/14/2022   IR US GUIDE VASC ACCESS RIGHT  03/14/2022   KNEE SURGERY     LEFT HEART CATH AND CORONARY ANGIOGRAPHY N/A 12/31/2017   Procedure: LEFT HEART CATH AND CORONARY ANGIOGRAPHY;  Surgeon: Charolette Forward, MD;  Location: [place] [facility];  Service: Cardiovascular;  Laterality: N/A;   MAZE N/A 12/29/2020   Procedure: MAZE;  Surgeon: [name], MD;  Location: [place];  Service: Open Heart Surgery;  Laterality: N/A;   MITRAL VALVE REPAIR N/A 12/29/2020   Procedure: MITRAL VALVE REPAIR USING CARBOMEDICS ANNULOFLEX RING SIZE 30;  Surgeon: [name], MD;  Location: [place];  Service: Open Heart Surgery;  Laterality: N/A;   RIGHT HEART CATH N/A 12/22/2020   Procedure: RIGHT HEART CATH;  Surgeon: Larey Dresser, MD;  Location: [place] [facility];  Service: Cardiovascular;  Laterality: N/A;   RIGHT HEART CATH N/A 01/31/2021   Procedure: RIGHT HEART CATH;  Surgeon: Jolaine Artist, MD;  Location: [place] [facility];  Service: Cardiovascular;  Laterality: N/A;   RIGHT HEART CATH N/A  07/29/2021   Procedure: RIGHT HEART CATH;  Surgeon: Jolaine Artist, MD;  Location: [place] [facility];  Service: Cardiovascular;  Laterality: N/A;   RIGHT HEART CATH N/A 11/24/2021   Procedure: RIGHT HEART CATH;  Surgeon: Jolaine Artist, MD;  Location: [place] [facility];  Service: Cardiovascular;  Laterality: N/A;   RIGHT HEART CATH N/A 02/14/2022   Procedure: RIGHT HEART CATH;  Surgeon: Jolaine Artist, MD;  Location: [place] [facility];  Service: Cardiovascular;  Laterality: N/A;   RIGHT HEART CATH N/A 02/22/2022   Procedure: RIGHT HEART CATH;  Surgeon: Larey Dresser, MD;  Location: [place] [facility];  Service: Cardiovascular;  Laterality: N/A;   TEE WITHOUT CARDIOVERSION N/A 11/26/2020   Procedure: TRANSESOPHAGEAL ECHOCARDIOGRAM (TEE);  Surgeon: Buford Dresser, MD;  Location: [facility];  Service: Cardiovascular;  Laterality: N/A;   TEE WITHOUT CARDIOVERSION N/A 12/29/2020   Procedure: TRANSESOPHAGEAL ECHOCARDIOGRAM (TEE);  Surgeon: [name], MD;  Location: [place];  Service: Open Heart Surgery;  Laterality: N/A;   TEE WITHOUT CARDIOVERSION N/A 01/20/2022   Procedure: TRANSESOPHAGEAL ECHOCARDIOGRAM (TEE);  Surgeon: Jolaine Artist, MD;  Location: [place];  Service: Cardiovascular;  Laterality: N/A;   TRICUSPID VALVE REPLACEMENT N/A 12/29/2020   Procedure: 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 RING SIZE 34;  Surgeon: [name], MD;  Location: [place];  Service: Open Heart Surgery;  Laterality: N/A;   Patient Active Problem List   Diagnosis Date Noted   HCAP (healthcare-associated pneumonia) 06/21/2022   ESRD on dialysis ([place]) 06/21/2022   OSA on CPAP 06/21/2022   Acute respiratory failure with hypoxia ([place])    Acute hypoxic respiratory failure ([place]) 06/20/2022   Acute kidney injury superimposed on chronic kidney disease ([place]) 03/18/2022   Class 3 obesity ([place]) 03/18/2022   Depression 01/21/2022   Chronic deep vein thrombosis  (DVT) of other vein of right upper extremity ([place]) 12/29/2021   Abnormal weight loss 11/16/2021   Personal history of colonic polyps 11/16/2021   Rib pain 11/16/2021   Anxiety and depression 08/26/2021   Essential hypertension 08/26/2021   GERD (gastroesophageal reflux disease) 08/26/2021   COPD (chronic obstructive pulmonary disease) ([place]) 07/29/2021   Biventricular heart failure ([place])    Atrial flutter ([place])    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   Encounter for preoperative dental examination    Teeth missing    Gingivitis    Accretions on teeth    Atrial fibrillation, permanent ([place]) 11/02/2020   Acute on chronic right heart failure ([place]) 05/06/2020   NICM (nonischemic cardiomyopathy) ([place]) 04/01/2020   Family history of heart disease 04/01/2020   Mitral regurgitation 04/01/2020   Acute on chronic left systolic heart failure ([place]) 07/01/2018   Acute on chronic diastolic CHF (congestive heart failure) ([place]) 07/01/2018   Chronic atrial fibrillation ([place]) 36/62/9476   Diastolic dysfunction 54/65/0354   Diabetes mellitus type 2 in obese ([place]) 12/26/2017   Hypoxia 09/21/2011   Type 2 diabetes mellitus with hyperlipidemia ([place]) 02/04/2010   AF (paroxysmal atrial fibrillation) (  [place]) 02/04/2010   Gastroparesis 02/04/2010    REFERRING DIAG: R53.81 (ICD-10-CM) - Physical deconditioning   THERAPY DIAG:  No diagnosis found.  Rationale for Evaluation and Treatment Rehabilitation  PERTINENT HISTORY: HTN, Depression, DM II, Gastroparesis CHF   PRECAUTIONS: Fall  SUBJECTIVE:                                                                                                                                                                                      SUBJECTIVE STATEMENT:  ***   PAIN:  Are you having pain?  Yes: NPRS scale: 6/10 Pain location: lower back, thighs, knees Pain description: dull ache Aggravating factors: prolonged  sitting Relieving factors: heat   OBJECTIVE: (objective measures completed at initial evaluation unless otherwise dated)   DIAGNOSTIC FINDINGS: See imaging for recent CT and MRI   PATIENT SURVEYS:  FOTO: 45% function; 55% predicted   COGNITION: Overall cognitive status: Within functional limits for tasks assessed                         SENSATION: WFL   EDEMA:  DNT   POSTURE:  large body habitus, rounded shoulders   PALPATION: Slight TTP to lumbar paraspinals   LOWER EXTREMITY MMT:   MMT Right eval Left eval  Hip flexion 3+/5 3+/5  Hip extension      Hip abduction 3+/5 3+/5  Hip adduction 3+/5 3+/5  Hip internal rotation      Hip external rotation      Knee flexion      Knee extension      Ankle dorsiflexion      Ankle plantarflexion      Ankle inversion      Ankle eversion       (Blank rows = not tested)   LOWER EXTREMITY SPECIAL TESTS:  DNT   FUNCTIONAL TESTS:  5 times sit to stand: 20 Timed up and go (TUG): 20 with SPC   GAIT: Distance walked: 72f Assistive device utilized: Single point cane Level of assistance: Modified independence Comments: slowed gait, wide BoS   TREATMENT: [facility] Adult PT Treatment:                                                DATE: 08/10/2022 Therapeutic Exercise: Nustep level 5 x 5 mins while gathering subjective STS 2x10 LAQ 2x10 4# Seated hamstring curl 2x10 BTB Standing hip abd/ext 2x10 each  [facility] Adult PT Treatment:  DATE: 08/02/2022 Therapeutic Exercise: Nustep level 5 x 5 mins while gathering subjective STS 2x10 LAQ 2x10 4# Seated hamstring curl 2x10 BTB Standing hip abd/ext 2x10 each  [facility] Adult PT Treatment:                                                DATE: 07/19/2022 Therapeutic Exercise: Nustep level 5 x 5 mins while gathering subjective STS x10, with 5# KB x5 (pt reports SOB) Supine clamshells BlueTB 2x10 Supine marching BlueTB 2x10 BIL Supine ball  squeeze 5" hold 2x10 Standing hip abduction with UE support x10 BIL Neuromuscular re-ed: FT stance x30" Semi tandem stance x30" BIL (LOB with Lt in back) FT with head turns x30"   [facility] Adult PT Treatment:                                                DATE: 07/03/2022 Therapeutic Exercise: Sit to Stand 2x10 Seated ball squeeze 2x10 - 5" hold Seated clamshell 2x15 - Blue Theraband Seated march 2x20 - Blue Theraband   PATIENT EDUCATION:  Education details: continue HEP Person educated: Patient Education method: Consulting civil engineer, Media planner, and Handouts Education comprehension: verbalized understanding and returned demonstration   HOME EXERCISE PROGRAM: Sit to Stand 2x10 - 2x/day Seated ball squeeze 2x10 - 5" hold  - 2x/day Seated clamshell 2x15 - Blue Theraband  - 2x/day Seated march 2x20 - Blue Theraband - 2x/day  Access Code: [email or web address] URL: [email or web address] Date: 08/02/2022 Prepared by: Octavio Manns  Exercises - Standing Hip Abduction with Counter Support  - 1 x daily - 7 x weekly - 2 sets - 10 reps - Standing Hip Extension with Counter Support  - 1 x daily - 7 x weekly - 2 sets - 10 reps   ASSESSMENT:   CLINICAL IMPRESSION: ***    OBJECTIVE IMPAIRMENTS: Abnormal gait, decreased activity tolerance, decreased balance, decreased endurance, decreased mobility, difficulty walking, decreased strength, and pain.    ACTIVITY LIMITATIONS: carrying, lifting, standing, squatting, stairs, transfers, bed mobility, bathing, and dressing   PARTICIPATION LIMITATIONS: meal prep, cleaning, driving, shopping, community activity, and yard work   PERSONAL FACTORS: Fitness and 3+ comorbidities: HTN, Depression, DM II, Gastroparesis CHF  are also affecting patient's functional outcome.      GOALS: Goals reviewed with patient? No   SHORT TERM GOALS: Target date: 07/24/2022  Pt will be compliant and knowledgeable with initial HEP for improved comfort and  carryover Baseline: initial HEP given  Goal status: INITIAL   LONG TERM GOALS: Target date: 08/14/2022    Pt will improve FOTO function score to no less than 55% as proxy for functional improvement Baseline: 45% function Goal status: INITIAL    2.  Pt will decrease Five Times Sit to Stand to no greater than 14 seconds for improved balance and functional mobility Baseline: 20 seconds Goal status: INITIAL   3.  Pt will decrease TUG with LRAD to no greater than 14 seconds for improved balance and functional mobility Baseline: 20 seconds with SPC Goal status: INITIAL   4.  Pt will increase all LE MMT to no less than 4/5 for improved strength and function Baseline: see cahrt Goal status: INITIAL   PLAN:   PT FREQUENCY: 1-2x/week  PT DURATION: 6 weeks   PLANNED INTERVENTIONS: Therapeutic exercises, Therapeutic activity, Neuromuscular re-education, Balance training, Gait training, Patient/Family education, Self Care, Joint mobilization, Aquatic Therapy, Electrical stimulation, Manual therapy, and Re-evaluation   PLAN FOR NEXT SESSION: assess HEP response, supine strengthening, progress as able   Ward Chatters, PT 08/10/2022, 8:17 AM

## 2022-08-12 ENCOUNTER — Other Ambulatory Visit (INDEPENDENT_AMBULATORY_CARE_PROVIDER_SITE_OTHER): Payer: Self-pay | Admitting: Primary Care

## 2022-08-12 DIAGNOSIS — F4323 Adjustment disorder with mixed anxiety and depressed mood: Secondary | ICD-10-CM

## 2022-08-16 ENCOUNTER — Other Ambulatory Visit (HOSPITAL_COMMUNITY): Payer: Self-pay | Admitting: Physician Assistant

## 2022-08-16 ENCOUNTER — Other Ambulatory Visit (INDEPENDENT_AMBULATORY_CARE_PROVIDER_SITE_OTHER): Payer: Self-pay | Admitting: Primary Care

## 2022-08-16 DIAGNOSIS — J0110 Acute frontal sinusitis, unspecified: Secondary | ICD-10-CM

## 2022-08-16 DIAGNOSIS — Z760 Encounter for issue of repeat prescription: Secondary | ICD-10-CM

## 2022-08-16 DIAGNOSIS — E1169 Type 2 diabetes mellitus with other specified complication: Secondary | ICD-10-CM

## 2022-08-16 NOTE — Telephone Encounter (Signed)
This was sent to [facility] on 07/05/2022 and received at 10:46 AM.    This request is from a mail order pharmacy.   Refused since sent to [facility].  Requested Prescriptions  Refused Prescriptions Disp Refills   busPIRone (BUSPAR) 5 MG tablet [Pharmacy Med Name: buspirone 5 mg tablet] 180 tablet 1    Sig: TAKE ONE TABLET BY MOUTH TWICE DAILY @ 9AM & 5PM     Psychiatry: Anxiolytics/Hypnotics - Non-controlled Passed - 08/12/2022  7:51 AM      Passed - Valid encounter within last 12 months    Recent Outpatient Visits           1 month ago Constipation, unspecified constipation type   [place] Juluis Mire P, NP   2 months ago Cough productive of purulent sputum   [place] [facility] Juluis Mire P, NP   3 months ago Adjustment disorder with mixed anxiety and depressed mood   [facility] Juluis Mire P, NP   7 months ago Acute frontal sinusitis, recurrence not specified   [place], [place], NP   11 months ago Hospital discharge follow-up   [place] [name], NP

## 2022-08-16 NOTE — Telephone Encounter (Signed)
Unable to refill per protocol, Rx request is too soon. Last refill 07/05/22 for 30 and 11 refills. Will refuse.  Requested Prescriptions  Pending Prescriptions Disp Refills   loratadine (CLARITIN) 10 MG tablet [Pharmacy Med Name: loratadine 10 mg tablet] 30 tablet 11    Sig: TAKE ONE TABLET BY MOUTH DAILY AT 9AM     Ear, Nose, and Throat:  Antihistamines 2 Failed - 08/16/2022  5:53 AM      Failed - Cr in normal range and within 360 days    Creatinine, Ser  Date Value Ref Range Status  06/28/2022 8.80 (H) 0.44 - 1.00 mg/dL Final   Creatinine, Urine  Date Value Ref Range Status  07/29/2021 97.28 mg/dL Final    Comment:    Performed at [facility], [place] [address], [place], [place] [address]         Passed - Valid encounter within last 12 months    Recent Outpatient Visits           1 month ago Constipation, unspecified constipation type   [place] Juluis Mire P, NP   2 months ago Cough productive of purulent sputum   [facility] Juluis Mire P, NP   3 months ago Adjustment disorder with mixed anxiety and depressed mood   [facility] Juluis Mire P, NP   7 months ago Acute frontal sinusitis, recurrence not specified   [place], [place], NP   11 months ago Hospital discharge follow-up   [place] [name], NP

## 2022-08-23 ENCOUNTER — Ambulatory Visit: Payer: Medicare Other | Attending: Primary Care

## 2022-08-23 NOTE — Therapy (Incomplete)
OUTPATIENT PHYSICAL THERAPY TREATMENT NOTE   Patient Name: [name] MRN: [id number] DOB:05-15-1956, 67 y.o., female Today's Date: 08/23/2022  PCP: [name], NP  REFERRING PROVIDER: Thurnell Lose, MD   END OF SESSION:      Past Medical History:  Diagnosis Date   Acute on chronic systolic CHF (congestive heart failure) ([place]) 12/21/2020   Allergic rhinitis    Arthritis    Asthma    Chronic diastolic CHF (congestive heart failure) ([facility])    COPD (chronic obstructive pulmonary disease) ([place])    Depression    DM (diabetes mellitus) ([place])    DVT (deep vein thrombosis) in pregnancy    GERD (gastroesophageal reflux disease)    HTN (hypertension)    Hyperlipidemia    Obesity    Pneumonia 04/2018   RIGHT LOBE   Sleep apnea    compliant with CPAP   Past Surgical History:  Procedure Laterality Date   ABDOMINAL HYSTERECTOMY     AV FISTULA PLACEMENT Left 03/17/2022   Procedure: LEFT ARM ARTERIOVENOUS (AV) FISTULA CREATION;  Surgeon: Angelia Mould, MD;  Location: [facility];  Service: Vascular;  Laterality: Left;   BUBBLE STUDY  11/26/2020   Procedure: BUBBLE STUDY;  Surgeon: Buford Dresser, MD;  Location: [facility];  Service: Cardiovascular;;   CARDIOVERSION N/A 05/06/2020   Procedure: CARDIOVERSION;  Surgeon: Buford Dresser, MD;  Location: [facility];  Service: Cardiovascular;  Laterality: N/A;   CARDIOVERSION N/A 11/04/2020   Procedure: CARDIOVERSION;  Surgeon: Lelon Perla, MD;  Location: [facility];  Service: Cardiovascular;  Laterality: N/A;   CARDIOVERSION N/A 02/01/2021   Procedure: CARDIOVERSION;  Surgeon: Jolaine Artist, MD;  Location: [facility];  Service: Cardiovascular;  Laterality: N/A;   CARDIOVERSION N/A 02/09/2021   Procedure: CARDIOVERSION;  Surgeon: Jolaine Artist, MD;  Location: [facility];  Service: Cardiovascular;  Laterality: N/A;   CARDIOVERSION N/A 04/19/2021   Procedure: CARDIOVERSION;  Surgeon:  Fay Records, MD;  Location: [facility];  Service: Cardiovascular;  Laterality: N/A;   CARDIOVERSION N/A 11/25/2021   Procedure: CARDIOVERSION;  Surgeon: Jolaine Artist, MD;  Location: [facility];  Service: Cardiovascular;  Laterality: N/A;   CARDIOVERSION N/A 01/31/2022   Procedure: CARDIOVERSION;  Surgeon: Jolaine Artist, MD;  Location: [facility];  Service: Cardiovascular;  Laterality: N/A;   CATARACT EXTRACTION, BILATERAL     CHOLECYSTECTOMY     COLONOSCOPY WITH PROPOFOL N/A 03/05/2015   Procedure: COLONOSCOPY WITH PROPOFOL;  Surgeon: Carol Ada, MD;  Location: [facility];  Service: Endoscopy;  Laterality: N/A;   DIAGNOSTIC LAPAROSCOPY     FISTULA SUPERFICIALIZATION Left 03/17/2022   Procedure: FISTULA SUPERFICIALIZATION -LEFT;  Surgeon: Angelia Mould, MD;  Location: [facility];  Service: Vascular;  Laterality: Left;   ingrown hallux Left    IR FLUORO GUIDE CV LINE RIGHT  03/14/2022   IR US GUIDE VASC ACCESS RIGHT  03/14/2022   KNEE SURGERY     LEFT HEART CATH AND CORONARY ANGIOGRAPHY N/A 12/31/2017   Procedure: LEFT HEART CATH AND CORONARY ANGIOGRAPHY;  Surgeon: Charolette Forward, MD;  Location: [facility];  Service: Cardiovascular;  Laterality: N/A;   MAZE N/A 12/29/2020   Procedure: MAZE;  Surgeon: [name], MD;  Location: [facility];  Service: Open Heart Surgery;  Laterality: N/A;   MITRAL VALVE REPAIR N/A 12/29/2020   Procedure: MITRAL VALVE REPAIR USING CARBOMEDICS ANNULOFLEX RING SIZE 30;  Surgeon: [name], MD;  Location: [place];  Service: Open Heart Surgery;  Laterality: N/A;   RIGHT HEART CATH N/A 12/22/2020   Procedure: RIGHT HEART CATH;  Surgeon: Larey Dresser, MD;  Location: [place] [facility];  Service: Cardiovascular;  Laterality: N/A;   RIGHT HEART CATH N/A 01/31/2021   Procedure: RIGHT HEART CATH;  Surgeon: Jolaine Artist, MD;  Location: [place] [facility];  Service: Cardiovascular;  Laterality: N/A;   RIGHT HEART CATH N/A  07/29/2021   Procedure: RIGHT HEART CATH;  Surgeon: Jolaine Artist, MD;  Location: [place] [facility];  Service: Cardiovascular;  Laterality: N/A;   RIGHT HEART CATH N/A 11/24/2021   Procedure: RIGHT HEART CATH;  Surgeon: Jolaine Artist, MD;  Location: [place] [facility];  Service: Cardiovascular;  Laterality: N/A;   RIGHT HEART CATH N/A 02/14/2022   Procedure: RIGHT HEART CATH;  Surgeon: Jolaine Artist, MD;  Location: [place] [facility];  Service: Cardiovascular;  Laterality: N/A;   RIGHT HEART CATH N/A 02/22/2022   Procedure: RIGHT HEART CATH;  Surgeon: Larey Dresser, MD;  Location: [place] [facility];  Service: Cardiovascular;  Laterality: N/A;   TEE WITHOUT CARDIOVERSION N/A 11/26/2020   Procedure: TRANSESOPHAGEAL ECHOCARDIOGRAM (TEE);  Surgeon: Buford Dresser, MD;  Location: [facility] END[facility]COPY;  Service: Cardiovascular;  Laterality: N/A;   TEE WITHOUT CARDIOVERSION N/A 12/29/2020   Procedure: TRANSESOPHAGEAL ECHOCARDIOGRAM (TEE);  Surgeon: [name], MD;  Location: [place];  Service: Open Heart Surgery;  Laterality: N/A;   TEE WITHOUT CARDIOVERSION N/A 01/20/2022   Procedure: TRANSESOPHAGEAL ECHOCARDIOGRAM (TEE);  Surgeon: Jolaine Artist, MD;  Location: [place];  Service: Cardiovascular;  Laterality: N/A;   TRICUSPID VALVE REPLACEMENT N/A 12/29/2020   Procedure: 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 RING SIZE 34;  Surgeon: [name], MD;  Location: [place];  Service: Open Heart Surgery;  Laterality: N/A;   Patient Active Problem List   Diagnosis Date Noted   HCAP (healthcare-associated pneumonia) 06/21/2022   ESRD on dialysis ([place]) 06/21/2022   OSA on CPAP 06/21/2022   Acute respiratory failure with hypoxia ([place])    Acute hypoxic respiratory failure ([place]) 06/20/2022   Acute kidney injury superimposed on chronic kidney disease ([place]) 03/18/2022   Class 3 obesity ([place]) 03/18/2022   Depression 01/21/2022   Chronic deep vein thrombosis  (DVT) of other vein of right upper extremity ([place]) 12/29/2021   Abnormal weight loss 11/16/2021   Personal history of colonic polyps 11/16/2021   Rib pain 11/16/2021   Anxiety and depression 08/26/2021   Essential hypertension 08/26/2021   GERD (gastroesophageal reflux disease) 08/26/2021   COPD (chronic obstructive pulmonary disease) ([place]) 07/29/2021   Biventricular heart failure ([place])    Atrial flutter ([place])    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   Encounter for preoperative dental examination    Teeth missing    Gingivitis    Accretions on teeth    Atrial fibrillation, permanent ([place]) 11/02/2020   Acute on chronic right heart failure ([place]) 05/06/2020   NICM (nonischemic cardiomyopathy) ([place]) 04/01/2020   Family history of heart disease 04/01/2020   Mitral regurgitation 04/01/2020   Acute on chronic left systolic heart failure ([place]) 07/01/2018   Acute on chronic diastolic CHF (congestive heart failure) ([place]) 07/01/2018   Chronic atrial fibrillation ([place]) 16/05/9603   Diastolic dysfunction 54/04/8118   Diabetes mellitus type 2 in obese ([place]) 12/26/2017   Hypoxia 09/21/2011   Type 2 diabetes mellitus with hyperlipidemia ([place]) 02/04/2010   AF (paroxysmal atrial fibrillation) (  [place]) 02/04/2010   Gastroparesis 02/04/2010    REFERRING DIAG: R53.81 (ICD-10-CM) - Physical deconditioning   THERAPY DIAG:  No diagnosis found.  Rationale for Evaluation and Treatment Rehabilitation  PERTINENT HISTORY: HTN, Depression, DM II, Gastroparesis CHF   PRECAUTIONS: Fall  SUBJECTIVE:                                                                                                                                                                                      SUBJECTIVE STATEMENT: *** Pt presents to PT with reports of decreased knee pain. Has been compliant with HEP. Pt is ready to begin PT at this time.    PAIN:  Are you having pain?  Yes: NPRS  scale: ***6/10 Pain location: lower back, thighs, knees Pain description: dull ache Aggravating factors: prolonged sitting Relieving factors: heat   OBJECTIVE: (objective measures completed at initial evaluation unless otherwise dated)   DIAGNOSTIC FINDINGS: See imaging for recent CT and MRI   PATIENT SURVEYS:  FOTO: 45% function; 55% predicted   COGNITION: Overall cognitive status: Within functional limits for tasks assessed                         SENSATION: WFL   EDEMA:  DNT   POSTURE:  large body habitus, rounded shoulders   PALPATION: Slight TTP to lumbar paraspinals   LOWER EXTREMITY MMT:   MMT Right eval Left eval Right 08/23/2022 Left 08/23/2022  Hip flexion 3+/5 3+/5    Hip extension        Hip abduction 3+/5 3+/5    Hip adduction 3+/5 3+/5    Hip internal rotation        Hip external rotation        Knee flexion        Knee extension        Ankle dorsiflexion        Ankle plantarflexion        Ankle inversion        Ankle eversion         (Blank rows = not tested)   LOWER EXTREMITY SPECIAL TESTS:  DNT   FUNCTIONAL TESTS:  5 times sit to stand: 20 Timed up and go (TUG): 20 with SPC   GAIT: Distance walked: 18f Assistive device utilized: Single point cane Level of assistance: Modified independence Comments: slowed gait, wide BoS   TREATMENT: [facility] Adult PT Treatment:  DATE: 08/23/2022 Therapeutic Exercise: Nustep level 5 x 5 mins while gathering subjective STS 2x10 LAQ 2x10 4# Seated hamstring curl 2x10 BTB Standing hip abd/ext 2x10 each Neuromuscular re-ed: FT stance x30" Semi tandem stance x30" BIL (LOB with Lt in back) FT with head turns x30" Therapeutic Activity: Re-administration of FOTO, 5xSTS, TUG, MMT and discussion of goals  [facility] Adult PT Treatment:                                                DATE: 08/02/2022 Therapeutic Exercise: Nustep level 5 x 5 mins while gathering  subjective STS 2x10 LAQ 2x10 4# Seated hamstring curl 2x10 BTB Standing hip abd/ext 2x10 each  [facility] Adult PT Treatment:                                                DATE: 07/19/2022 Therapeutic Exercise: Nustep level 5 x 5 mins while gathering subjective STS x10, with 5# KB x5 (pt reports SOB) Supine clamshells BlueTB 2x10 Supine marching BlueTB 2x10 BIL Supine ball squeeze 5" hold 2x10 Standing hip abduction with UE support x10 BIL Neuromuscular re-ed: FT stance x30" Semi tandem stance x30" BIL (LOB with Lt in back) FT with head turns x30"    PATIENT EDUCATION:  Education details: continue HEP Person educated: Patient Education method: Explanation, Demonstration, and Handouts Education comprehension: verbalized understanding and returned demonstration   HOME EXERCISE PROGRAM: Sit to Stand 2x10 - 2x/day Seated ball squeeze 2x10 - 5" hold  - 2x/day Seated clamshell 2x15 - Blue Theraband  - 2x/day Seated march 2x20 - Blue Theraband - 2x/day  Access Code: [email or web address] URL: [email or web address] Date: 08/02/2022 Prepared by: Octavio Manns  Exercises - Standing Hip Abduction with Counter Support  - 1 x daily - 7 x weekly - 2 sets - 10 reps - Standing Hip Extension with Counter Support  - 1 x daily - 7 x weekly - 2 sets - 10 reps   ASSESSMENT:   CLINICAL IMPRESSION: ***  Pt was able to complete all prescribed exercises with no adverse effect. Therapy focused on improving LE strength and functional mobility, with emphasis on proximal hip. Pt is progressing with therapy, will continue per POC as prescribed.     OBJECTIVE IMPAIRMENTS: Abnormal gait, decreased activity tolerance, decreased balance, decreased endurance, decreased mobility, difficulty walking, decreased strength, and pain.    ACTIVITY LIMITATIONS: carrying, lifting, standing, squatting, stairs, transfers, bed mobility, bathing, and dressing   PARTICIPATION LIMITATIONS: meal prep, cleaning, driving,  shopping, community activity, and yard work   PERSONAL FACTORS: Fitness and 3+ comorbidities: HTN, Depression, DM II, Gastroparesis CHF  are also affecting patient's functional outcome.      GOALS: Goals reviewed with patient? No   SHORT TERM GOALS: Target date: 07/24/2022  Pt will be compliant and knowledgeable with initial HEP for improved comfort and carryover Baseline: initial HEP given  Goal status: INITIAL   LONG TERM GOALS: Target date: 08/14/2022    Pt will improve FOTO function score to no less than 55% as proxy for functional improvement Baseline: 45% function Goal status: INITIAL    2.  Pt will decrease Five Times Sit to Stand to no greater than 14 seconds for improved balance and functional  mobility Baseline: 20 seconds Goal status: INITIAL   3.  Pt will decrease TUG with LRAD to no greater than 14 seconds for improved balance and functional mobility Baseline: 20 seconds with SPC Goal status: INITIAL   4.  Pt will increase all LE MMT to no less than 4/5 for improved strength and function Baseline: see cahrt Goal status: INITIAL   PLAN:   PT FREQUENCY: 1-2x/week   PT DURATION: 6 weeks   PLANNED INTERVENTIONS: Therapeutic exercises, Therapeutic activity, Neuromuscular re-education, Balance training, Gait training, Patient/Family education, Self Care, Joint mobilization, Aquatic Therapy, Electrical stimulation, Manual therapy, and Re-evaluation   PLAN FOR NEXT SESSION: assess HEP response, supine strengthening, progress as able   Margarette Canada, PTA 08/23/2022, 8:55 AM

## 2022-08-25 ENCOUNTER — Ambulatory Visit (INDEPENDENT_AMBULATORY_CARE_PROVIDER_SITE_OTHER): Payer: Self-pay

## 2022-08-25 NOTE — Telephone Encounter (Signed)
  Chief Complaint: nausea Symptoms: nausea occurring daily to where has decreased appetite. Pt states nausea work after dialysis T/Th/Sa Frequency: ongoing for several weeks  Pertinent Negatives: Patient denies vomiting Disposition: '[]'$ ED /'[]'$ Urgent Care (no appt availability in office) / '[]'$ Appointment(In office/virtual)/ '[]'$  [facility]/ '[]'$ Home Care/ '[]'$ Refused Recommended Disposition /'[]'$ [facility]/ '[x]'$  Follow-up with PCP Additional Notes: pt requesting something for nausea to take especially on dialysis days since its worse after completion of dialysis. No appts available until 09/13/21 and pt doesn't have access to Mychart to do virtual UC or E-visit. Advised pt I would send message to provider to see if she can prescribe something and nurse can FU with pt.   Summary: Nausea   The patient called in stating she has been dealing with nausea for a couple of weeks. She has it everyday before and after dialysis. She truly needs something to help with this. She says she thinks she may have been prescribed something in the past. Please assist patient further. She uses  [facility], [address] Phone: [phone] Fax: [phone]     Reason for Disposition  Nausea is a chronic symptom (recurrent or ongoing AND present > 4 weeks)  Answer Assessment - Initial Assessment Questions 1. NAUSEA SEVERITY: "How bad is the nausea?" (e.g., mild, moderate, severe; dehydration, weight loss)   - MILD: loss of appetite without change in eating habits   - MODERATE: decreased oral intake without significant weight loss, dehydration, or malnutrition   - SEVERE: inadequate caloric or fluid intake, significant weight loss, symptoms of dehydration     Mild to moderate  2. ONSET: "When did the nausea begin?"     Several weeks  3. VOMITING: "Any vomiting?" If Yes, ask: "How many times today?"     no 4. RECURRENT SYMPTOM: "Have you had nausea  before?" If Yes, ask: "When was the last time?" "What happened that time?"     Had years ago unsure what she was prescribed  Protocols used: Nausea-A-AH

## 2022-08-26 ENCOUNTER — Other Ambulatory Visit (INDEPENDENT_AMBULATORY_CARE_PROVIDER_SITE_OTHER): Payer: Self-pay | Admitting: Primary Care

## 2022-08-26 MED ORDER — ONDANSETRON HCL 4 MG PO TABS: 4.0000 mg | ORAL_TABLET | Freq: Three times a day (TID) | ORAL | 1 refills | Status: DC | PRN

## 2022-08-28 ENCOUNTER — Ambulatory Visit: Payer: Medicare Other

## 2022-08-28 ENCOUNTER — Telehealth (HOSPITAL_COMMUNITY): Payer: Self-pay | Admitting: *Deleted

## 2022-08-28 NOTE — Telephone Encounter (Signed)
Patient advised to f/u with dialysis. Aware medication is at pharmacy.

## 2022-08-28 NOTE — Telephone Encounter (Signed)
Pt let vm stating she gets cramps in her hands, feet, and legs. Pt asked for an appt with Dr.Bensimhon she said she has not seen him a while. I called pt  at [phone] no answer/left vm for return call.

## 2022-08-28 NOTE — Therapy (Incomplete)
OUTPATIENT PHYSICAL THERAPY TREATMENT NOTE   Patient Name: [name] MRN: [id number] DOB:Jan 03, 1956, 66 y.o., female Today's Date: 08/28/2022  PCP: [name], NP  REFERRING PROVIDER: Thurnell Lose, MD   END OF SESSION:      Past Medical History:  Diagnosis Date   Acute on chronic systolic CHF (congestive heart failure) ([place]) 12/21/2020   Allergic rhinitis    Arthritis    Asthma    Chronic diastolic CHF (congestive heart failure) ([facility])    COPD (chronic obstructive pulmonary disease) ([place])    Depression    DM (diabetes mellitus) ([place])    DVT (deep vein thrombosis) in pregnancy    GERD (gastroesophageal reflux disease)    HTN (hypertension)    Hyperlipidemia    Obesity    Pneumonia 04/2018   RIGHT LOBE   Sleep apnea    compliant with CPAP   Past Surgical History:  Procedure Laterality Date   ABDOMINAL HYSTERECTOMY     AV FISTULA PLACEMENT Left 03/17/2022   Procedure: LEFT ARM ARTERIOVENOUS (AV) FISTULA CREATION;  Surgeon: Angelia Mould, MD;  Location: [place];  Service: Vascular;  Laterality: Left;   BUBBLE STUDY  11/26/2020   Procedure: BUBBLE STUDY;  Surgeon: Buford Dresser, MD;  Location: [place];  Service: Cardiovascular;;   CARDIOVERSION N/A 05/06/2020   Procedure: CARDIOVERSION;  Surgeon: Buford Dresser, MD;  Location: [place];  Service: Cardiovascular;  Laterality: N/A;   CARDIOVERSION N/A 11/04/2020   Procedure: CARDIOVERSION;  Surgeon: Lelon Perla, MD;  Location: [place];  Service: Cardiovascular;  Laterality: N/A;   CARDIOVERSION N/A 02/01/2021   Procedure: CARDIOVERSION;  Surgeon: Jolaine Artist, MD;  Location: [place];  Service: Cardiovascular;  Laterality: N/A;   CARDIOVERSION N/A 02/09/2021   Procedure: CARDIOVERSION;  Surgeon: Jolaine Artist, MD;  Location: [place];  Service: Cardiovascular;  Laterality: N/A;   CARDIOVERSION N/A 04/19/2021   Procedure: CARDIOVERSION;  Surgeon:  Fay Records, MD;  Location: [place];  Service: Cardiovascular;  Laterality: N/A;   CARDIOVERSION N/A 11/25/2021   Procedure: CARDIOVERSION;  Surgeon: Jolaine Artist, MD;  Location: [place];  Service: Cardiovascular;  Laterality: N/A;   CARDIOVERSION N/A 01/31/2022   Procedure: CARDIOVERSION;  Surgeon: Jolaine Artist, MD;  Location: [place];  Service: Cardiovascular;  Laterality: N/A;   CATARACT EXTRACTION, BILATERAL     CHOLECYSTECTOMY     COLONOSCOPY WITH PROPOFOL N/A 03/05/2015   Procedure: COLONOSCOPY WITH PROPOFOL;  Surgeon: Carol Ada, MD;  Location: [facility];  Service: Endoscopy;  Laterality: N/A;   DIAGNOSTIC LAPAROSCOPY     FISTULA SUPERFICIALIZATION Left 03/17/2022   Procedure: FISTULA SUPERFICIALIZATION -LEFT;  Surgeon: Angelia Mould, MD;  Location: [facility];  Service: Vascular;  Laterality: Left;   ingrown hallux Left    IR FLUORO GUIDE CV LINE RIGHT  03/14/2022   IR US GUIDE VASC ACCESS RIGHT  03/14/2022   KNEE SURGERY     LEFT HEART CATH AND CORONARY ANGIOGRAPHY N/A 12/31/2017   Procedure: LEFT HEART CATH AND CORONARY ANGIOGRAPHY;  Surgeon: Charolette Forward, MD;  Location: [place] [facility];  Service: Cardiovascular;  Laterality: N/A;   MAZE N/A 12/29/2020   Procedure: MAZE;  Surgeon: [name], MD;  Location: [place];  Service: Open Heart Surgery;  Laterality: N/A;   MITRAL VALVE REPAIR N/A 12/29/2020   Procedure: MITRAL VALVE REPAIR USING CARBOMEDICS ANNULOFLEX RING SIZE 30;  Surgeon: [name], MD;  Location: [place];  Service: Open Heart Surgery;  Laterality: N/A;   RIGHT HEART CATH N/A 12/22/2020   Procedure: RIGHT HEART CATH;  Surgeon: Larey Dresser, MD;  Location: [place] [facility];  Service: Cardiovascular;  Laterality: N/A;   RIGHT HEART CATH N/A 01/31/2021   Procedure: RIGHT HEART CATH;  Surgeon: Jolaine Artist, MD;  Location: [place] [facility];  Service: Cardiovascular;  Laterality: N/A;   RIGHT HEART CATH N/A  07/29/2021   Procedure: RIGHT HEART CATH;  Surgeon: Jolaine Artist, MD;  Location: [place] [facility];  Service: Cardiovascular;  Laterality: N/A;   RIGHT HEART CATH N/A 11/24/2021   Procedure: RIGHT HEART CATH;  Surgeon: Jolaine Artist, MD;  Location: [place] [facility];  Service: Cardiovascular;  Laterality: N/A;   RIGHT HEART CATH N/A 02/14/2022   Procedure: RIGHT HEART CATH;  Surgeon: Jolaine Artist, MD;  Location: [place] [facility];  Service: Cardiovascular;  Laterality: N/A;   RIGHT HEART CATH N/A 02/22/2022   Procedure: RIGHT HEART CATH;  Surgeon: Larey Dresser, MD;  Location: [place] [facility];  Service: Cardiovascular;  Laterality: N/A;   TEE WITHOUT CARDIOVERSION N/A 11/26/2020   Procedure: TRANSESOPHAGEAL ECHOCARDIOGRAM (TEE);  Surgeon: Buford Dresser, MD;  Location: [facility];  Service: Cardiovascular;  Laterality: N/A;   TEE WITHOUT CARDIOVERSION N/A 12/29/2020   Procedure: TRANSESOPHAGEAL ECHOCARDIOGRAM (TEE);  Surgeon: [name], MD;  Location: [place];  Service: Open Heart Surgery;  Laterality: N/A;   TEE WITHOUT CARDIOVERSION N/A 01/20/2022   Procedure: TRANSESOPHAGEAL ECHOCARDIOGRAM (TEE);  Surgeon: Jolaine Artist, MD;  Location: [place];  Service: Cardiovascular;  Laterality: N/A;   TRICUSPID VALVE REPLACEMENT N/A 12/29/2020   Procedure: 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 RING SIZE 34;  Surgeon: [name], MD;  Location: [place];  Service: Open Heart Surgery;  Laterality: N/A;   Patient Active Problem List   Diagnosis Date Noted   HCAP (healthcare-associated pneumonia) 06/21/2022   ESRD on dialysis ([place]) 06/21/2022   OSA on CPAP 06/21/2022   Acute respiratory failure with hypoxia ([place])    Acute hypoxic respiratory failure ([place]) 06/20/2022   Acute kidney injury superimposed on chronic kidney disease ([place]) 03/18/2022   Class 3 obesity ([place]) 03/18/2022   Depression 01/21/2022   Chronic deep vein thrombosis  (DVT) of other vein of right upper extremity ([place]) 12/29/2021   Abnormal weight loss 11/16/2021   Personal history of colonic polyps 11/16/2021   Rib pain 11/16/2021   Anxiety and depression 08/26/2021   Essential hypertension 08/26/2021   GERD (gastroesophageal reflux disease) 08/26/2021   COPD (chronic obstructive pulmonary disease) ([place]) 07/29/2021   Biventricular heart failure ([place])    Atrial flutter ([place])    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   Encounter for preoperative dental examination    Teeth missing    Gingivitis    Accretions on teeth    Atrial fibrillation, permanent ([place]) 11/02/2020   Acute on chronic right heart failure ([place]) 05/06/2020   NICM (nonischemic cardiomyopathy) ([place]) 04/01/2020   Family history of heart disease 04/01/2020   Mitral regurgitation 04/01/2020   Acute on chronic left systolic heart failure ([place]) 07/01/2018   Acute on chronic diastolic CHF (congestive heart failure) ([place]) 07/01/2018   Chronic atrial fibrillation ([place]) 49/70/2637   Diastolic dysfunction 85/88/5027   Diabetes mellitus type 2 in obese ([place]) 12/26/2017   Hypoxia 09/21/2011   Type 2 diabetes mellitus with hyperlipidemia ([place]) 02/04/2010   AF (paroxysmal atrial fibrillation) (  [place]) 02/04/2010   Gastroparesis 02/04/2010    REFERRING DIAG: R53.81 (ICD-10-CM) - Physical deconditioning   THERAPY DIAG:  No diagnosis found.  Rationale for Evaluation and Treatment Rehabilitation  PERTINENT HISTORY: HTN, Depression, DM II, Gastroparesis CHF   PRECAUTIONS: Fall  SUBJECTIVE:                                                                                                                                                                                      SUBJECTIVE STATEMENT:  ***   PAIN:  Are you having pain?  Yes: NPRS scale: 6/10 Pain location: lower back, thighs, knees Pain description: dull ache Aggravating factors: prolonged  sitting Relieving factors: heat   OBJECTIVE: (objective measures completed at initial evaluation unless otherwise dated)   DIAGNOSTIC FINDINGS: See imaging for recent CT and MRI   PATIENT SURVEYS:  FOTO: 45% function; 55% predicted   COGNITION: Overall cognitive status: Within functional limits for tasks assessed                         SENSATION: WFL   EDEMA:  DNT   POSTURE:  large body habitus, rounded shoulders   PALPATION: Slight TTP to lumbar paraspinals   LOWER EXTREMITY MMT:   MMT Right eval Left eval  Hip flexion 3+/5 3+/5  Hip extension      Hip abduction 3+/5 3+/5  Hip adduction 3+/5 3+/5  Hip internal rotation      Hip external rotation      Knee flexion      Knee extension      Ankle dorsiflexion      Ankle plantarflexion      Ankle inversion      Ankle eversion       (Blank rows = not tested)   LOWER EXTREMITY SPECIAL TESTS:  DNT   FUNCTIONAL TESTS:  5 times sit to stand: 20 Timed up and go (TUG): 20 with SPC   GAIT: Distance walked: 109f Assistive device utilized: Single point cane Level of assistance: Modified independence Comments: slowed gait, wide BoS   TREATMENT: [facility] Adult PT Treatment:                                                DATE: 08/28/2022 Therapeutic Exercise: Nustep level 5 x 5 mins while gathering subjective STS 2x10 LAQ 2x10 4# Seated hamstring curl 2x10 BTB Standing hip abd/ext 2x10 each  [facility] Adult PT Treatment:  DATE: 08/02/2022 Therapeutic Exercise: Nustep level 5 x 5 mins while gathering subjective STS 2x10 LAQ 2x10 4# Seated hamstring curl 2x10 BTB Standing hip abd/ext 2x10 each  [facility] Adult PT Treatment:                                                DATE: 07/19/2022 Therapeutic Exercise: Nustep level 5 x 5 mins while gathering subjective STS x10, with 5# KB x5 (pt reports SOB) Supine clamshells BlueTB 2x10 Supine marching BlueTB 2x10 BIL Supine ball  squeeze 5" hold 2x10 Standing hip abduction with UE support x10 BIL Neuromuscular re-ed: FT stance x30" Semi tandem stance x30" BIL (LOB with Lt in back) FT with head turns x30"   [facility] Adult PT Treatment:                                                DATE: 07/03/2022 Therapeutic Exercise: Sit to Stand 2x10 Seated ball squeeze 2x10 - 5" hold Seated clamshell 2x15 - Blue Theraband Seated march 2x20 - Blue Theraband   PATIENT EDUCATION:  Education details: continue HEP Person educated: Patient Education method: Consulting civil engineer, Media planner, and Handouts Education comprehension: verbalized understanding and returned demonstration   HOME EXERCISE PROGRAM: Sit to Stand 2x10 - 2x/day Seated ball squeeze 2x10 - 5" hold  - 2x/day Seated clamshell 2x15 - Blue Theraband  - 2x/day Seated march 2x20 - Blue Theraband - 2x/day  Access Code: [email or web address] URL: [email or web address] Date: 08/02/2022 Prepared by: Octavio Manns  Exercises - Standing Hip Abduction with Counter Support  - 1 x daily - 7 x weekly - 2 sets - 10 reps - Standing Hip Extension with Counter Support  - 1 x daily - 7 x weekly - 2 sets - 10 reps   ASSESSMENT:   CLINICAL IMPRESSION: ***    OBJECTIVE IMPAIRMENTS: Abnormal gait, decreased activity tolerance, decreased balance, decreased endurance, decreased mobility, difficulty walking, decreased strength, and pain.    ACTIVITY LIMITATIONS: carrying, lifting, standing, squatting, stairs, transfers, bed mobility, bathing, and dressing   PARTICIPATION LIMITATIONS: meal prep, cleaning, driving, shopping, community activity, and yard work   PERSONAL FACTORS: Fitness and 3+ comorbidities: HTN, Depression, DM II, Gastroparesis CHF  are also affecting patient's functional outcome.      GOALS: Goals reviewed with patient? No   SHORT TERM GOALS: Target date: 07/24/2022  Pt will be compliant and knowledgeable with initial HEP for improved comfort and  carryover Baseline: initial HEP given  Goal status: INITIAL   LONG TERM GOALS: Target date: 08/14/2022    Pt will improve FOTO function score to no less than 55% as proxy for functional improvement Baseline: 45% function Goal status: INITIAL    2.  Pt will decrease Five Times Sit to Stand to no greater than 14 seconds for improved balance and functional mobility Baseline: 20 seconds Goal status: INITIAL   3.  Pt will decrease TUG with LRAD to no greater than 14 seconds for improved balance and functional mobility Baseline: 20 seconds with SPC Goal status: INITIAL   4.  Pt will increase all LE MMT to no less than 4/5 for improved strength and function Baseline: see cahrt Goal status: INITIAL   PLAN:   PT FREQUENCY: 1-2x/week  PT DURATION: 6 weeks   PLANNED INTERVENTIONS: Therapeutic exercises, Therapeutic activity, Neuromuscular re-education, Balance training, Gait training, Patient/Family education, Self Care, Joint mobilization, Aquatic Therapy, Electrical stimulation, Manual therapy, and Re-evaluation   PLAN FOR NEXT SESSION: assess HEP response, supine strengthening, progress as able   Ward Chatters, PT 08/28/2022, 7:36 AM

## 2022-08-30 ENCOUNTER — Ambulatory Visit: Payer: Medicare Other

## 2022-09-01 ENCOUNTER — Ambulatory Visit: Payer: Medicare Other | Admitting: Podiatry

## 2022-09-03 ENCOUNTER — Encounter (HOSPITAL_COMMUNITY): Payer: Self-pay

## 2022-09-03 ENCOUNTER — Emergency Department (HOSPITAL_COMMUNITY): Payer: 59

## 2022-09-03 ENCOUNTER — Other Ambulatory Visit: Payer: Self-pay

## 2022-09-03 ENCOUNTER — Emergency Department (HOSPITAL_COMMUNITY)
Admission: EM | Admit: 2022-09-03 | Discharge: 2022-09-03 | Disposition: A | Discharge status: Home / Self Care | Payer: 59 | Attending: Emergency Medicine | Admitting: Emergency Medicine

## 2022-09-03 ENCOUNTER — Other Ambulatory Visit (HOSPITAL_COMMUNITY): Payer: 59

## 2022-09-03 DIAGNOSIS — R079 Chest pain, unspecified: Secondary | ICD-10-CM | POA: Diagnosis present

## 2022-09-03 DIAGNOSIS — Z7901 Long term (current) use of anticoagulants: Secondary | ICD-10-CM | POA: Insufficient documentation

## 2022-09-03 DIAGNOSIS — Z992 Dependence on renal dialysis: Secondary | ICD-10-CM | POA: Diagnosis not present

## 2022-09-03 DIAGNOSIS — E1122 Type 2 diabetes mellitus with diabetic chronic kidney disease: Secondary | ICD-10-CM | POA: Diagnosis not present

## 2022-09-03 DIAGNOSIS — R0602 Shortness of breath: Secondary | ICD-10-CM | POA: Diagnosis not present

## 2022-09-03 DIAGNOSIS — Z794 Long term (current) use of insulin: Secondary | ICD-10-CM | POA: Insufficient documentation

## 2022-09-03 DIAGNOSIS — R0789 Other chest pain: Secondary | ICD-10-CM | POA: Insufficient documentation

## 2022-09-03 DIAGNOSIS — R112 Nausea with vomiting, unspecified: Secondary | ICD-10-CM | POA: Diagnosis not present

## 2022-09-03 DIAGNOSIS — K219 Gastro-esophageal reflux disease without esophagitis: Secondary | ICD-10-CM | POA: Insufficient documentation

## 2022-09-03 DIAGNOSIS — R5383 Other fatigue: Secondary | ICD-10-CM | POA: Diagnosis not present

## 2022-09-03 DIAGNOSIS — N186 End stage renal disease: Secondary | ICD-10-CM | POA: Insufficient documentation

## 2022-09-03 DIAGNOSIS — M549 Dorsalgia, unspecified: Secondary | ICD-10-CM | POA: Insufficient documentation

## 2022-09-03 DIAGNOSIS — R109 Unspecified abdominal pain: Secondary | ICD-10-CM | POA: Diagnosis not present

## 2022-09-03 DIAGNOSIS — R509 Fever, unspecified: Secondary | ICD-10-CM | POA: Insufficient documentation

## 2022-09-03 DIAGNOSIS — I509 Heart failure, unspecified: Secondary | ICD-10-CM | POA: Diagnosis not present

## 2022-09-03 DIAGNOSIS — R059 Cough, unspecified: Secondary | ICD-10-CM | POA: Diagnosis not present

## 2022-09-03 LAB — BASIC METABOLIC PANEL
Anion gap: 15 (ref 5–15)
BUN: 24 mg/dL — ABNORMAL HIGH (ref 8–23)
CO2: 28 mmol/L (ref 22–32)
Calcium: 10 mg/dL (ref 8.9–10.3)
Chloride: 95 mmol/L — ABNORMAL LOW (ref 98–111)
Creatinine, Ser: 6.65 mg/dL — ABNORMAL HIGH (ref 0.44–1.00)
GFR, Estimated: 6 mL/min — ABNORMAL LOW (ref >60)
Glucose, Bld: 94 mg/dL (ref 70–99)
Potassium: 4.2 mmol/L (ref 3.5–5.1)
Sodium: 138 mmol/L (ref 135–145)

## 2022-09-03 LAB — CBC
HCT: 35.2 % — ABNORMAL LOW (ref 36.0–46.0)
Hemoglobin: 11.3 g/dL — ABNORMAL LOW (ref 12.0–15.0)
MCH: 30.5 pg (ref 26.0–34.0)
MCHC: 32.1 g/dL (ref 30.0–36.0)
MCV: 95.1 fL (ref 80.0–100.0)
Platelets: 147 10*3/uL — ABNORMAL LOW (ref 150–400)
RBC: 3.7 MIL/uL — ABNORMAL LOW (ref 3.87–5.11)
RDW: 14.6 % (ref 11.5–15.5)
WBC: 8 10*3/uL (ref 4.0–10.5)
nRBC: 0 % (ref 0.0–0.2)

## 2022-09-03 LAB — TROPONIN I (HIGH SENSITIVITY)
Troponin I (High Sensitivity): 8 ng/L (ref <18)
Troponin I (High Sensitivity): 7 ng/L (ref <18)

## 2022-09-03 MED ORDER — ONDANSETRON HCL 4 MG PO TABS: 4.0000 mg | ORAL_TABLET | Freq: Four times a day (QID) | ORAL | 0 refills | Status: DC

## 2022-09-03 MED ORDER — ONDANSETRON HCL 4 MG/2ML IJ SOLN
4.0000 mg | Freq: Once | INTRAMUSCULAR | Status: AC
  Administered 2022-09-03: 4 mg via INTRAVENOUS
  Filled 2022-09-03: qty 2

## 2022-09-03 MED ORDER — IOHEXOL 350 MG/ML SOLN
100.0000 mL | Freq: Once | INTRAVENOUS | Status: AC | PRN
  Administered 2022-09-03: 100 mL via INTRAVENOUS

## 2022-09-03 NOTE — ED Triage Notes (Addendum)
Pt arrived via [facility] from home for midsternal chest pain that radiates to left shoulder and nausea that started yesterday. Pt refused ASA and nitro due t nausea. Dialysis pt Tues, Thurs, Sat. Had dialysis two days in a row this past week Friday and Saturday due to missing tx from not feeling well.

## 2022-09-03 NOTE — Discharge Instructions (Addendum)
You were seen in the ER for evaluation of your symptoms. Your labs work appears at your baseline. Your imaging is at it's baseline. Please follow up with your cardiologist for follow up. Given that you received contrast today, YOU WILL NEED DIALYSIS TOMORROW ON MONDAY. I am sending you home with Zofran for nausea. You can take as needed. Please call your dialysis center to arrange. If you have any concerns, new or worsening symptoms, please return to the nearest ER immediately for re-evaluation.   Contact a doctor if: Your chest pain does not go away. You feel depressed. You have a fever. Get help right away if: Your chest pain is worse. You have a cough that gets worse, or you cough up blood. You have very bad (severe) pain in your belly (abdomen). You pass out (faint). You have either of these for no clear reason: Sudden chest discomfort. Sudden discomfort in your arms, back, neck, or jaw. You have shortness of breath at any time. You suddenly start to sweat, or your skin gets clammy. You feel sick to your stomach (nauseous). You throw up (vomit). You suddenly feel lightheaded or dizzy. You feel very weak or tired. Your heart starts to beat fast, or it feels like it is skipping beats. These symptoms may be an emergency. Do not wait to see if the symptoms will go away. Get medical help right away. Call your local emergency services (911 in the [place] Do not drive yourself to the hospital.

## 2022-09-03 NOTE — ED Notes (Signed)
Provider at bedside 

## 2022-09-03 NOTE — ED Provider Notes (Signed)
[facility] EMERGENCY DEPARTMENT Provider Note   CSN: [id number] Arrival date & time: 09/03/22  1318     History Chief Complaint  Patient presents with   Chest Pain    [name] is a 67 y.o. female with history of GERD, ESRD on dialysis TTHSat, atrial fibrillation, CHF, gastroparesis, diabetes presents the ER for evaluation of multiple complaints.  Complains mainly of the midsternal chest pain that radiates to her left shoulder, lower abdomen, and back.  She reports it has been going on for the past 2 days and has been intermittent.  She also ports that she been having some cough and cold symptoms as well.  Mentions a fever of 100 F earlier today, denies any medications she is afebrile now.  She reports a nonproductive cough with the nausea and vomiting. She also reports some shortness of breath as well.  She declined nitro and aspirin with EMS. She has not taken any medications for her symptoms. She reports compliancy with her medications never missing a dose. She reports that she was at dialysis on Saturday and Friday due to previous missed sessions.    Chest Pain Associated symptoms: abdominal pain, back pain, cough, fatigue, fever, nausea, shortness of breath and vomiting        Home Medications Prior to Admission medications   Medication Sig Start Date End Date Taking? Authorizing Provider  acetaminophen (TYLENOL) 325 MG tablet Take 2 tablets (650 mg total) by mouth every 6 (six) hours as needed for mild pain or moderate pain. 12/07/21   Arrien, Jimmy Picket, MD  albuterol (VENTOLIN HFA) 108 (90 Base) MCG/ACT inhaler INHALE 1-2 PUFFS BY MOUTH EVERY 6 HOURS AS NEEDED FOR WHEEZE OR SHORTNESS OF BREATH 07/05/22   [name], NP  amiodarone (PACERONE) 200 MG tablet TAKE 1 TABLET BY MOUTH TWICE A DAY 04/25/22   Bensimhon, Shaune Pascal, MD  apixaban (ELIQUIS) 5 MG TABS tablet TAKE ONE TABLET BY MOUTH TWICE DAILY at [place] Patient taking differently: Take 5  mg by mouth in the morning and at bedtime. 02/24/22   Shelly Coss, MD  benzonatate (TESSALON PERLES) 100 MG capsule Take 1 capsule (100 mg total) by mouth 3 (three) times daily as needed for cough. 05/23/22   [name], NP  Blood Glucose Monitoring Suppl (ACCU-CHEK GUIDE) w/Device KIT Use to check blood sugar TID. Dx: E11.69 06/28/22   Charlott Rakes, MD  busPIRone (BUSPAR) 5 MG tablet Take 1 tablet (5 mg total) by mouth 2 (two) times daily. 07/05/22   [name], NP  colchicine 0.6 MG tablet TAKE 0.5 TABLETS BY MOUTH DAILY. 07/28/22   Charlott Rakes, MD  diphenhydrAMINE (BENADRYL) 25 mg capsule Take 25 mg by mouth every 6 (six) hours as needed for allergies or itching.    [provider]  doxycycline (VIBRAMYCIN) 100 MG capsule Take 1 capsule (100 mg total) by mouth 2 (two) times daily. 06/25/22   Thurnell Lose, MD  Dulaglutide (TRULICITY) 1.5 AC/1.6SA SOPN Inject 1.5 mg into the skin once a week. 07/05/22   [name], NP  DULoxetine (CYMBALTA) 60 MG capsule Take 1 capsule (60 mg total) by mouth daily. 07/05/22   [name], NP  EPINEPHrine 0.3 mg/0.3 mL IJ SOAJ injection Inject 0.3 mg into the muscle as needed for anaphylaxis. 06/25/20   [name], NP  fluticasone (FLONASE) 50 MCG/ACT nasal spray Place 2 sprays into both nostrils daily as needed for allergies or rhinitis.  07/05/22   [name], NP  glucose blood (ACCU-CHEK GUIDE) test strip USE TO CHECK BLOOD SUGAR 3 TIMES DAILY 04/03/22   Charlott Rakes, MD  guaiFENesin 200 MG tablet Take 1 tablet (200 mg total) by mouth every 4 (four) hours as needed for cough or to loosen phlegm. 07/05/22   [name], NP  insulin lispro (HUMALOG KWIKPEN) 100 UNIT/ML KwikPen Inject 10 Units into the skin 3 (three) times daily. 07/05/22   [name], NP  isosorbide mononitrate (IMDUR) 30 MG 24 hr tablet Take 0.5 tablets (15 mg total) by mouth daily. 03/18/22 06/20/22  Arrien,  Jimmy Picket, MD  KLOR-CON M20 20 MEQ tablet Take 20 mEq by mouth 2 (two) times daily. 04/18/22   [provider]  lidocaine-prilocaine (EMLA) cream SMARTSIG:sparingly Topical 3 Times a Week 06/01/22   [provider]  linaclotide (LINZESS) 72 MCG capsule TAKE 1 CAPSULE (72 MCG TOTAL) BY MOUTH DAILY BEFORE BREAKFAST. IF BM NOT ACHIEVE WITH 1 CAPSULE SHE MAY TAKE 2 DAILY. STOP IF DIARRHEA OCCURS 07/05/22   [name], NP  loratadine (CLARITIN) 10 MG tablet Take 1 tablet (10 mg total) by mouth daily. 07/05/22   [name], NP  metolazone (ZAROXOLYN) 2.5 MG tablet Take 2.5 mg by mouth 2 (two) times a week. 04/20/22   [provider]  metoprolol succinate (TOPROL-XL) 25 MG 24 hr tablet Take 1 tablet (25 mg total) by mouth daily. 03/18/22 06/20/22  Arrien, Jimmy Picket, MD  Multiple Vitamins-Minerals (MULTIVITAMIN WOMEN PO) Take 1 tablet by mouth daily.    [provider]  multivitamin (RENA-VIT) TABS tablet Take 1 tablet by mouth daily. 04/27/22   [provider]  nitroGLYCERIN (NITROSTAT) 0.4 MG SL tablet Place 1 tablet (0.4 mg total) under the tongue every 5 (five) minutes x 3 doses as needed for chest pain. 01/08/14   Charolette Forward, MD  ondansetron (ZOFRAN) 4 MG tablet Take 1 tablet (4 mg total) by mouth every 8 (eight) hours as needed for nausea or vomiting. 08/26/22   [name], NP  pantoprazole (PROTONIX) 40 MG tablet TAKE ONE TABLET BY MOUTH DAILY AT 9AM 04/17/22   [name], NP  polyethylene glycol powder (GLYCOLAX/MIRALAX) 17 GM/SCOOP powder Take 17 g by mouth daily. Patient taking differently: Take 17 g by mouth daily as needed for mild constipation. 02/25/22   Shelly Coss, MD  pregabalin (LYRICA) 100 MG capsule Take 1 capsule (100 mg total) by mouth 3 (three) times daily as needed (for pain). 07/05/22   [name], NP  RESTASIS 0.05 % ophthalmic emulsion Place 1 drop into both eyes in the morning and at  bedtime. 11/19/17   [provider]  rosuvastatin (CRESTOR) 10 MG tablet TAKE ONE TABLET BY MOUTH DAILY AT 5PM 08/16/22   Joette Catching, PA-C  senna-docusate (SENOKOT-S) 8.6-50 MG tablet Take 1 tablet by mouth 2 (two) times daily. 02/24/22   Shelly Coss, MD  torsemide (DEMADEX) 100 MG tablet Take 1 tablet by mouth. Monday Wednesday and Friday 04/02/22   [provider]      Allergies    Bee pollen, Bee venom, and Sulfa antibiotics    Review of Systems   Review of Systems  Constitutional:  Positive for fatigue and fever. Negative for chills.  HENT:  Positive for congestion and rhinorrhea.   Respiratory:  Positive for cough and shortness of breath.   Cardiovascular:  Positive for chest pain.  Gastrointestinal:  Positive for abdominal  pain, nausea and vomiting.  Genitourinary:        The patient does not make much urine.  No urinary complaints at this time.  Musculoskeletal:  Positive for back pain.    Physical Exam Updated Vital Signs BP 105/78   Pulse 99   Temp 97.8 F (36.6 C)   Resp (!) 24   Ht '5\' 7"'$  (1.702 m)   Wt 131.8 kg   SpO2 98%   BMI 45.51 kg/m  Physical Exam Constitutional:      General: She is not in acute distress.    Appearance: Normal appearance. She is obese. She is not toxic-appearing or diaphoretic.  HENT:     Head: Normocephalic and atraumatic.  Eyes:     General: No scleral icterus. Cardiovascular:     Rate and Rhythm: Normal rate. Rhythm irregular.  Pulmonary:     Effort: Pulmonary effort is normal. No tachypnea.     Breath sounds: Normal breath sounds. No decreased breath sounds.  Abdominal:     General: Abdomen is flat. Bowel sounds are normal.     Palpations: Abdomen is soft.     Tenderness: There is abdominal tenderness. There is no guarding or rebound.     Comments: Abdominal exam limited secondary to body habitus. Generalized abdominal tenderness to palpation without any guarding or rebound. NBS. Soft.    Musculoskeletal:        General: No deformity.     Cervical back: Normal range of motion.  Skin:    General: Skin is warm and dry.  Neurological:     General: No focal deficit present.     Mental Status: She is alert. Mental status is at baseline.     ED Results / Procedures / Treatments   Labs (all labs ordered are listed, but only abnormal results are displayed) Labs Reviewed  BASIC METABOLIC PANEL - Abnormal; Notable for the following components:      Result Value   Chloride 95 (*)    BUN 24 (*)    Creatinine, Ser 6.65 (*)    GFR, Estimated 6 (*)    All other components within normal limits  CBC - Abnormal; Notable for the following components:   RBC 3.70 (*)    Hemoglobin 11.3 (*)    HCT 35.2 (*)    Platelets 147 (*)    All other components within normal limits  TROPONIN I (HIGH SENSITIVITY)  TROPONIN I (HIGH SENSITIVITY)    EKG EKG Interpretation  Date/Time:  Sunday September 03 2022 13:22:54 EST Ventricular Rate:  100 PR Interval:    QRS Duration: 100 QT Interval:  364 QTC Calculation: 469 R Axis:   146 Text Interpretation: new Atrial fibrillation Incomplete right bundle branch block Possible Right ventricular hypertrophy Cannot rule out Anterior infarct , age undetermined When compared with ECG of 28-Jun-2022 18:10, PREVIOUS ECG IS PRESENT Confirmed by Blanchie Dessert [phone]) on 09/03/2022 2:30:31 PM  Radiology CT Angio Chest/Abd/Pel for Dissection W and/or Wo Contrast  Result Date: 09/03/2022 CLINICAL DATA:  Midsternal chest pain radiating to left shoulder, nausea EXAM: CT ANGIOGRAPHY CHEST, ABDOMEN AND PELVIS TECHNIQUE: Non-contrast CT of the chest was initially obtained. Multidetector CT imaging through the chest, abdomen and pelvis was performed using the standard protocol during bolus administration of intravenous contrast. Multiplanar reconstructed images and MIPs were obtained and reviewed to evaluate the vascular anatomy. RADIATION DOSE REDUCTION: This  exam was performed according to the departmental dose-optimization program which includes automated exposure control, adjustment of the  mA and/or kV according to patient size and/or use of iterative reconstruction technique. CONTRAST:  123m OMNIPAQUE IOHEXOL 350 MG/ML SOLN COMPARISON:  06/21/2022, 09/03/2022 FINDINGS: CTA CHEST FINDINGS Cardiovascular: Stable cardiomegaly. Occlusion device left atrial appendage. Previous mitral and tricuspid valve repair. Right internal jugular catheter tip within the right atrium. No pericardial effusion. No evidence of thoracic aortic aneurysm or dissection. Atherosclerosis of the aortic arch. There is technically adequate opacification of the pulmonary vasculature. No filling defects or pulmonary emboli. Dilated main pulmonary artery consistent with pulmonary arterial hypertension. Mediastinum/Nodes: No enlarged mediastinal, hilar, or axillary lymph nodes. Thyroid gland, trachea, and esophagus demonstrate no significant findings. Lungs/Pleura: No acute airspace disease, effusion, or pneumothorax. Central airways are patent. Musculoskeletal: No acute or destructive bony lesions. Reconstructed images demonstrate no additional findings. Review of the MIP images confirms the above findings. CTA ABDOMEN AND PELVIS FINDINGS VASCULAR Aorta: Normal caliber aorta without aneurysm, dissection, vasculitis or significant stenosis. Atherosclerosis. Celiac: Patent without evidence of aneurysm, dissection, vasculitis or significant stenosis. SMA: Patent without evidence of aneurysm, dissection, vasculitis or significant stenosis. Renals: Single renal arteries bilaterally. Stable atherosclerosis at the origin of the right renal artery without flow-limiting stenosis. No right-sided aneurysm, dissection, or vasculitis. There are 2 left renal artery aneurysms again noted. The anterior aneurysm measures 1.8 x 2.2 x 1.5 cm, previously measuring 2.1 x 2.0 x 1.6 cm by my measurement. There is broad  communication with the left renal artery. The posterior aneurysm measures 1.2 x 1.7 x 1.4 cm, unchanged since prior exam. No critical stenosis. IMA: Patent without evidence of aneurysm, dissection, vasculitis or significant stenosis. Inflow: Patent without evidence of aneurysm, dissection, vasculitis or significant stenosis. Veins: No obvious venous abnormality within the limitations of this arterial phase study. Review of the MIP images confirms the above findings. NON-VASCULAR Hepatobiliary: No focal liver abnormality is seen. Status post cholecystectomy. No biliary dilatation. Pancreas: Unremarkable. No pancreatic ductal dilatation or surrounding inflammatory changes. Spleen: Normal in size without focal abnormality. Adrenals/Urinary Tract: Adrenal glands are unremarkable. Kidneys are normal, without renal calculi, focal lesion, or hydronephrosis. Bladder is decompressed. Stomach/Bowel: No bowel obstruction or ileus. Normal appendix right lower quadrant. No bowel wall thickening or inflammatory change. Lymphatic: No pathologic adenopathy within the abdomen or pelvis. Stable borderline enlarged gastrohepatic lymph node is nonspecific. Reproductive: Status post hysterectomy. No adnexal masses. Other: No free fluid or free intraperitoneal gas. No abdominal wall hernia. Musculoskeletal: No acute or destructive bony lesions. Reconstructed images demonstrate no additional findings. Review of the MIP images confirms the above findings. IMPRESSION: Vascular: 1. No evidence of thoracoabdominal aortic aneurysm or dissection. 2. 2 separate left renal artery aneurysms as above, without significant change since prior exam. 3. No evidence of pulmonary embolism. 4.  Aortic Atherosclerosis (ICD10-I70.0). Nonvascular: 1. No acute intrathoracic, intra-abdominal, or intrapelvic process. 2. Cardiomegaly, with evidence of prior tricuspid and mitral valve repair. 3. Dilated main pulmonary artery consistent with pulmonary arterial  hypertension, stable. Electronically Signed   By: MRanda NgoM.D.   On: 09/03/2022 17:19   DG Chest Port 1 View  Result Date: 09/03/2022 CLINICAL DATA:  Chest pain EXAM: PORTABLE CHEST 1 VIEW COMPARISON:  Chest radiograph 06/28/2022 FINDINGS: Stable cardiomediastinal contours status post median sternotomy with enlarged heart size. Unchanged position of a right central venous catheter. There is central venous congestion with faint diffuse interstitial opacities favored to represent mild edema. No new focal consolidation. No pneumothorax or large pleural effusion. No acute finding in the visualized skeleton. IMPRESSION: Cardiomegaly with faint  diffuse interstitial opacities favored to represent trace edema. Electronically Signed   By: Audie Pinto M.D.   On: 09/03/2022 14:15    Procedures Procedures   Medications Ordered in ED Medications  ondansetron (ZOFRAN) injection 4 mg (has no administration in time range)  iohexol (OMNIPAQUE) 350 MG/ML injection 100 mL (100 mLs Intravenous Contrast Given 09/03/22 1702)    ED Course/ Medical Decision Making/ A&P                            Medical Decision Making Amount and/or Complexity of Data Reviewed Radiology: ordered.  Risk Prescription drug management.   ***    I discussed this case with my attending physician who cosigned this note including patient's presenting symptoms, physical exam, and planned diagnostics and interventions. Attending physician stated agreement with plan or made changes to plan which were implemented.   Attending physician assessed patient at bedside.  Final Clinical Impression(s) / ED Diagnoses Final diagnoses:  None    Rx / DC Orders ED Discharge Orders     None

## 2022-09-03 NOTE — ED Notes (Signed)
Patient request for update on the plan of care sent to the providers

## 2022-09-03 NOTE — ED Notes (Signed)
Patient transported to CT 

## 2022-09-03 NOTE — ED Provider Triage Note (Signed)
Emergency Medicine Provider Triage Evaluation Note  [name] , a 67 y.o. female  was evaluated in triage.  Pt complains of dialysis patient Tuesday, Thursday, Saturday here with chest pain substernal radiating to her left arm.  Refused nitroglycerin and aspirin with EMS that she reports some nausea.  She did have dialysis yesterday, also had dialysis on Friday as she missed her Thursday session because she was feeling nauseous.  Also reporting some epigastric pain and decrease in oral intake.  Review of Systems  Positive: Epigastric pain, chest pain, sob Negative: Fever,  Physical Exam  BP 121/87 (BP Location: Right Arm)   Pulse 99   Temp 97.8 F (36.6 C)   Resp 18   Ht '5\' 7"'$  (1.702 m)   Wt 131.8 kg   SpO2 99%   BMI 45.51 kg/m  Gen:   Awake, no distress   Resp:  Normal effort  MSK:   Moves extremities without difficulty  Other:    Medical Decision Making  Medically screening exam initiated at 1:47 PM.  Appropriate orders placed.  [name] was informed that the remainder of the evaluation will be completed by another provider, this initial triage assessment does not replace that evaluation, and the importance of remaining in the ED until their evaluation is complete.     Leslie Fitting, PA-C 09/03/22 1350

## 2022-09-03 NOTE — ED Notes (Addendum)
Patient passes PO challenge at this time and has been provided with Drinks and snacks PA made aware

## 2022-09-03 NOTE — ED Notes (Signed)
PROVIDER AT BEDSIDE.

## 2022-09-03 NOTE — ED Notes (Signed)
Requested covid swabs to be sent from lab at this time. Lab reports they can't add on the aptt

## 2022-09-06 ENCOUNTER — Other Ambulatory Visit (INDEPENDENT_AMBULATORY_CARE_PROVIDER_SITE_OTHER): Payer: Self-pay | Admitting: Primary Care

## 2022-09-06 DIAGNOSIS — J0110 Acute frontal sinusitis, unspecified: Secondary | ICD-10-CM

## 2022-09-06 DIAGNOSIS — Z760 Encounter for issue of repeat prescription: Secondary | ICD-10-CM

## 2022-09-06 DIAGNOSIS — F4323 Adjustment disorder with mixed anxiety and depressed mood: Secondary | ICD-10-CM

## 2022-09-06 NOTE — Telephone Encounter (Signed)
Requested Prescriptions  Pending Prescriptions Disp Refills   loratadine (CLARITIN) 10 MG tablet [Pharmacy Med Name: loratadine 10 mg tablet] 30 tablet 11    Sig: TAKE ONE TABLET BY MOUTH DAILY AT 9AM     Ear, Nose, and Throat:  Antihistamines 2 Failed - 09/06/2022  5:30 AM      Failed - Cr in normal range and within 360 days    Creatinine, Ser  Date Value Ref Range Status  09/03/2022 6.65 (H) 0.44 - 1.00 mg/dL Final   Creatinine, Urine  Date Value Ref Range Status  07/29/2021 97.28 mg/dL Final    Comment:    Performed at [facility], [place] [address], [place], [place] [address]         Passed - Valid encounter within last 12 months    Recent Outpatient Visits           2 months ago Constipation, unspecified constipation type   [place] Juluis Mire P, NP   3 months ago Cough productive of purulent sputum   [place] Juluis Mire P, NP   3 months ago Adjustment disorder with mixed anxiety and depressed mood   [place] Juluis Mire P, NP   8 months ago Acute frontal sinusitis, recurrence not specified   [place] [name], NP   11 months ago Hospital discharge follow-up   [place], Michelle P, NP               busPIRone (BUSPAR) 5 MG tablet [Pharmacy Med Name: buspirone 5 mg tablet] 180 tablet 11    Sig: TAKE ONE TABLET BY MOUTH TWICE DAILY @ 9AM & 5PM     Psychiatry: Anxiolytics/Hypnotics - Non-controlled Passed - 09/06/2022  5:30 AM      Passed - Valid encounter within last 12 months    Recent Outpatient Visits           2 months ago Constipation, unspecified constipation type   [place] Juluis Mire P, NP   3 months ago Cough productive of purulent sputum   [place] Juluis Mire P, NP   3 months ago Adjustment disorder with mixed anxiety  and depressed mood   [facility] Juluis Mire P, NP   8 months ago Acute frontal sinusitis, recurrence not specified   [place], Michelle P, NP   11 months ago Hospital discharge follow-up   [place] [name], NP

## 2022-09-07 ENCOUNTER — Other Ambulatory Visit (HOSPITAL_COMMUNITY): Payer: Self-pay | Admitting: Internal Medicine

## 2022-09-08 ENCOUNTER — Encounter: Payer: Self-pay | Admitting: Podiatry

## 2022-09-08 ENCOUNTER — Ambulatory Visit (INDEPENDENT_AMBULATORY_CARE_PROVIDER_SITE_OTHER): Payer: 59 | Admitting: Podiatry

## 2022-09-08 DIAGNOSIS — M79675 Pain in left toe(s): Secondary | ICD-10-CM | POA: Diagnosis not present

## 2022-09-08 DIAGNOSIS — M79674 Pain in right toe(s): Secondary | ICD-10-CM | POA: Diagnosis not present

## 2022-09-08 DIAGNOSIS — L84 Corns and callosities: Secondary | ICD-10-CM | POA: Diagnosis not present

## 2022-09-08 DIAGNOSIS — B351 Tinea unguium: Secondary | ICD-10-CM

## 2022-09-11 ENCOUNTER — Ambulatory Visit (INDEPENDENT_AMBULATORY_CARE_PROVIDER_SITE_OTHER): Payer: Self-pay | Admitting: *Deleted

## 2022-09-11 NOTE — Progress Notes (Signed)
Subjective:   Patient ID: [name], female   DOB: 67 y.o.   MRN: [id number]   HPI Patient presents stating her nails are elongated and thickened and she was concerned about some nails that have a little bit of more discoloration and states they get sore and she cannot take care of them with obesity is complicating factor   ROS      Objective:  Physical Exam  Neuro vascular status intact with yellow brittle nailbeds 1-5 both feet painful when pressed     Assessment:  Mycotic nail infection with pain 1-5 both feet     Plan:  Debrided painful nailbeds 1-5 both feet neurogenic bleeding reappoint routine care

## 2022-09-11 NOTE — Telephone Encounter (Signed)
Summary: Vomitting & Nauseous Advice   Pt is calling to report that she has been sick on the stomach since she started taking Sevelamer '800mg'$  tablets given to her dialysis. Pt feels very nauseous and on occasion even vomiting for 2 mouths. With  No appetite. Pt is on dialysis. Please advise         Chief Complaint: N/V medication questions. Symptoms: nausea every day all day. Zofran not effective per patient. Vomiting at times none today. Reports since starting sevelamer 800 mg for dialysis she has had sx N/V no appetite. Not eating , dizziness at times. Last BM within 3 days. HD T, TH , Sat. Frequency: 2 months  Pertinent Negatives: Patient denies chest pain no difficulty breathing .  Disposition: '[]'$ ED /'[x]'$ Urgent Care (no appt availability in office) / '[]'$ Appointment(In office/virtual)/ '[]'$  [facility]/ '[]'$ Home Care/ '[]'$ Refused Recommended Disposition /'[]'$ [facility]/ '[]'$  Follow-up with PCP Additional Notes:   No available appt until 09/27/22. Due to dizziness. Recommended UC. Recommended patient contact renal MD regarding medication changes. Please adivse . Patient would like a call back from PCP.      Reason for Disposition  Taking prescription medication that could cause nausea (e.g., narcotics/opiates, antibiotics, OCPs, many others)  Answer Assessment - Initial Assessment Questions 1. NAUSEA SEVERITY: "How bad is the nausea?" (e.g., mild, moderate, severe; dehydration, weight loss)   - MILD: loss of appetite without change in eating habits   - MODERATE: decreased oral intake without significant weight loss, dehydration, or malnutrition   - SEVERE: inadequate caloric or fluid intake, significant weight loss, symptoms of dehydration     Moderate. Decreased appetite since taking sevelamer 800 mg.  2. ONSET: "When did the nausea begin?"   2 months ago  3. VOMITING: "Any vomiting?" If Yes, ask: "How many times today?"     Yes at times but not today  4. RECURRENT  SYMPTOM: "Have you had nausea before?" If Yes, ask: "When was the last time?" "What happened that time?"     Nausea every day all day  5. CAUSE: "What do you think is causing the nausea?"     Sevelamer for dialysis 6. PREGNANCY: "Is there any chance you are pregnant?" (e.g., unprotected intercourse, missed birth control pill, broken condom)     na  Protocols used: Nausea-A-AH

## 2022-09-11 NOTE — Telephone Encounter (Signed)
Summary: Vomitting & Nauseous Advice   Pt is calling to report that she has been sick on the stomach since she started taking Sevelamer '800mg'$  tablets given to her dialysis. Pt feels very nauseous and on occasion even vomiting for 2 mouths. With  No appetite. Pt is on dialysis. Please advise      Called patient on [phone] and recording can not be completed as dialed. Try call again later. Unable to leave message. Will try call again later.

## 2022-09-13 ENCOUNTER — Ambulatory Visit: Payer: 59 | Attending: Cardiology | Admitting: Cardiology

## 2022-09-14 ENCOUNTER — Emergency Department (HOSPITAL_BASED_OUTPATIENT_CLINIC_OR_DEPARTMENT_OTHER)
Admission: EM | Admit: 2022-09-14 | Discharge: 2022-09-14 | Disposition: A | Discharge status: Home / Self Care | Payer: 59 | Attending: Emergency Medicine | Admitting: Emergency Medicine

## 2022-09-14 ENCOUNTER — Encounter (HOSPITAL_BASED_OUTPATIENT_CLINIC_OR_DEPARTMENT_OTHER): Payer: Self-pay | Admitting: *Deleted

## 2022-09-14 ENCOUNTER — Emergency Department (HOSPITAL_BASED_OUTPATIENT_CLINIC_OR_DEPARTMENT_OTHER): Payer: 59 | Admitting: Radiology

## 2022-09-14 ENCOUNTER — Other Ambulatory Visit: Payer: Self-pay

## 2022-09-14 DIAGNOSIS — Z79899 Other long term (current) drug therapy: Secondary | ICD-10-CM | POA: Diagnosis not present

## 2022-09-14 DIAGNOSIS — J449 Chronic obstructive pulmonary disease, unspecified: Secondary | ICD-10-CM | POA: Insufficient documentation

## 2022-09-14 DIAGNOSIS — N186 End stage renal disease: Secondary | ICD-10-CM | POA: Diagnosis not present

## 2022-09-14 DIAGNOSIS — E1122 Type 2 diabetes mellitus with diabetic chronic kidney disease: Secondary | ICD-10-CM | POA: Diagnosis not present

## 2022-09-14 DIAGNOSIS — I132 Hypertensive heart and chronic kidney disease with heart failure and with stage 5 chronic kidney disease, or end stage renal disease: Secondary | ICD-10-CM | POA: Diagnosis not present

## 2022-09-14 DIAGNOSIS — Z794 Long term (current) use of insulin: Secondary | ICD-10-CM | POA: Diagnosis not present

## 2022-09-14 DIAGNOSIS — K3184 Gastroparesis: Secondary | ICD-10-CM | POA: Diagnosis not present

## 2022-09-14 DIAGNOSIS — Z7951 Long term (current) use of inhaled steroids: Secondary | ICD-10-CM | POA: Diagnosis not present

## 2022-09-14 DIAGNOSIS — E1143 Type 2 diabetes mellitus with diabetic autonomic (poly)neuropathy: Secondary | ICD-10-CM | POA: Insufficient documentation

## 2022-09-14 DIAGNOSIS — I509 Heart failure, unspecified: Secondary | ICD-10-CM | POA: Diagnosis not present

## 2022-09-14 DIAGNOSIS — Z992 Dependence on renal dialysis: Secondary | ICD-10-CM | POA: Diagnosis not present

## 2022-09-14 DIAGNOSIS — R112 Nausea with vomiting, unspecified: Secondary | ICD-10-CM

## 2022-09-14 DIAGNOSIS — R072 Precordial pain: Secondary | ICD-10-CM

## 2022-09-14 DIAGNOSIS — R0789 Other chest pain: Secondary | ICD-10-CM | POA: Diagnosis present

## 2022-09-14 DIAGNOSIS — Z7901 Long term (current) use of anticoagulants: Secondary | ICD-10-CM | POA: Diagnosis not present

## 2022-09-14 LAB — CBC WITH DIFFERENTIAL/PLATELET
Abs Immature Granulocytes: 0.01 10*3/uL (ref 0.00–0.07)
Basophils Absolute: 0 10*3/uL (ref 0.0–0.1)
Basophils Relative: 1 %
Eosinophils Absolute: 0.2 10*3/uL (ref 0.0–0.5)
Eosinophils Relative: 5 %
HCT: 32.1 % — ABNORMAL LOW (ref 36.0–46.0)
Hemoglobin: 10.6 g/dL — ABNORMAL LOW (ref 12.0–15.0)
Immature Granulocytes: 0 %
Lymphocytes Relative: 28 %
Lymphs Abs: 1 10*3/uL (ref 0.7–4.0)
MCH: 30.6 pg (ref 26.0–34.0)
MCHC: 33 g/dL (ref 30.0–36.0)
MCV: 92.8 fL (ref 80.0–100.0)
Monocytes Absolute: 0.9 10*3/uL (ref 0.1–1.0)
Monocytes Relative: 25 %
Neutro Abs: 1.6 10*3/uL — ABNORMAL LOW (ref 1.7–7.7)
Neutrophils Relative %: 41 %
Platelets: 164 10*3/uL (ref 150–400)
RBC: 3.46 MIL/uL — ABNORMAL LOW (ref 3.87–5.11)
RDW: 14.6 % (ref 11.5–15.5)
WBC: 3.7 10*3/uL — ABNORMAL LOW (ref 4.0–10.5)
nRBC: 0 % (ref 0.0–0.2)

## 2022-09-14 LAB — LACTIC ACID, PLASMA: Lactic Acid, Venous: 1.4 mmol/L (ref 0.5–1.9)

## 2022-09-14 LAB — COMPREHENSIVE METABOLIC PANEL
ALT: 14 U/L (ref 0–44)
AST: 21 U/L (ref 15–41)
Albumin: 4.1 g/dL (ref 3.5–5.0)
Alkaline Phosphatase: 88 U/L (ref 38–126)
Anion gap: 11 (ref 5–15)
BUN: 33 mg/dL — ABNORMAL HIGH (ref 8–23)
CO2: 32 mmol/L (ref 22–32)
Calcium: 10.3 mg/dL (ref 8.9–10.3)
Chloride: 93 mmol/L — ABNORMAL LOW (ref 98–111)
Creatinine, Ser: 8.2 mg/dL — ABNORMAL HIGH (ref 0.44–1.00)
GFR, Estimated: 5 mL/min — ABNORMAL LOW (ref >60)
Glucose, Bld: 93 mg/dL (ref 70–99)
Potassium: 4.4 mmol/L (ref 3.5–5.1)
Sodium: 136 mmol/L (ref 135–145)
Total Bilirubin: 0.6 mg/dL (ref 0.3–1.2)
Total Protein: 8.6 g/dL — ABNORMAL HIGH (ref 6.5–8.1)

## 2022-09-14 LAB — TROPONIN I (HIGH SENSITIVITY)
Troponin I (High Sensitivity): 7 ng/L (ref <18)
Troponin I (High Sensitivity): 6 ng/L (ref <18)

## 2022-09-14 LAB — CBG MONITORING, ED: Glucose-Capillary: 87 mg/dL (ref 70–99)

## 2022-09-14 MED ORDER — DIPHENHYDRAMINE HCL 50 MG/ML IJ SOLN
25.0000 mg | Freq: Once | INTRAMUSCULAR | Status: AC
  Administered 2022-09-14: 25 mg via INTRAVENOUS
  Filled 2022-09-14: qty 1

## 2022-09-14 MED ORDER — FENTANYL CITRATE PF 50 MCG/ML IJ SOSY
50.0000 ug | PREFILLED_SYRINGE | Freq: Once | INTRAMUSCULAR | Status: DC
  Filled 2022-09-14: qty 1

## 2022-09-14 MED ORDER — HALOPERIDOL LACTATE 5 MG/ML IJ SOLN
5.0000 mg | Freq: Once | INTRAMUSCULAR | Status: AC
  Administered 2022-09-14: 5 mg via INTRAVENOUS
  Filled 2022-09-14: qty 1

## 2022-09-14 NOTE — ED Triage Notes (Signed)
Patient with onset of chest pain this morning when getting ready for dialysis.  Patient states she has sharp pain in the mid to left side of her chest.  She has had n/v for one week.  Patient states she feels terrible.  Patient has dialysis catheter to the right upper chest.  She has hx CHF.  She denies any recent weight gain.  Patient is taking her medications as directed.  Patient last had dialysis on yesterday and was returning today

## 2022-09-14 NOTE — ED Notes (Signed)
Patient assisted back to bed.  Friend at bedside

## 2022-09-14 NOTE — ED Provider Notes (Signed)
[facility] Provider Note   CSN: [id number] Arrival date & time: 09/14/22  5573     History  Chief Complaint  Patient presents with   Chest Pain   Nausea   Emesis    [name] is a 67 y.o. female.  HPI     67 year old female comes in with chief complaint of chest pain, nausea, vomiting. Patient has history of diabetes, hypertension, hyperlipidemia, COPD, DVT on blood thinners, CHF, questionable pulmonary hypertension and ESRD on hemodialysis, last session was yesterday.  Patient states that she has been feeling unwell for the last 4 to 5 days.  Her symptoms primarily have included generalized weakness, nausea, vomiting and malaise.  She had 3 episodes of emesis yesterday and has persistent nausea.  Emesis is typically preceded by p.o. intake.  She has epigastric chest pain, and umbilical abdominal pain that has been constant for the last 2 days.  This morning she woke up with left-sided chest pain described as heaviness, prompting her to come to the emergency room.  Review of system is positive for subjective fevers with Tmax of 101. Patient denies any cough, orthopnea, PND.  She denies any new leg swelling, weight gain.  Patient has been compliant with all of her medications.  She missed her Tuesday dialysis, but went to dialysis make-up session yesterday.  She has no known coronary artery disease.  She denies any chest pain or shortness of breath with exertion leading up to the episode this morning.  The chest pain right now is constant.  Home Medications Prior to Admission medications   Medication Sig Start Date End Date Taking? Authorizing Provider  acetaminophen (TYLENOL) 325 MG tablet Take 2 tablets (650 mg total) by mouth every 6 (six) hours as needed for mild pain or moderate pain. 12/07/21   Arrien, Jimmy Picket, MD  albuterol (VENTOLIN HFA) 108 (90 Base) MCG/ACT inhaler INHALE 1-2 PUFFS BY MOUTH EVERY 6 HOURS AS NEEDED  FOR WHEEZE OR SHORTNESS OF BREATH 07/05/22   [name], NP  amiodarone (PACERONE) 200 MG tablet TAKE 1 TABLET BY MOUTH TWICE A DAY 04/25/22   Bensimhon, Shaune Pascal, MD  amoxicillin (AMOXIL) 500 MG capsule Take 500 mg by mouth 3 (three) times daily. 09/06/22   [provider]  apixaban (ELIQUIS) 5 MG TABS tablet TAKE ONE TABLET BY MOUTH TWICE DAILY at [place] Patient taking differently: Take 5 mg by mouth in the morning and at bedtime. 02/24/22   Shelly Coss, MD  benzonatate (TESSALON PERLES) 100 MG capsule Take 1 capsule (100 mg total) by mouth 3 (three) times daily as needed for cough. 05/23/22   [name], NP  Blood Glucose Monitoring Suppl (ACCU-CHEK GUIDE) w/Device KIT Use to check blood sugar TID. Dx: E11.69 06/28/22   Charlott Rakes, MD  busPIRone (BUSPAR) 5 MG tablet TAKE ONE TABLET BY MOUTH TWICE DAILY @ 9AM & 5PM 09/06/22   [name], NP  colchicine 0.6 MG tablet TAKE 0.5 TABLETS BY MOUTH DAILY. 07/28/22   Charlott Rakes, MD  diphenhydrAMINE (BENADRYL) 25 mg capsule Take 25 mg by mouth every 6 (six) hours as needed for allergies or itching.    [provider]  doxycycline (VIBRAMYCIN) 100 MG capsule Take 1 capsule (100 mg total) by mouth 2 (two) times daily. 06/25/22   Thurnell Lose, MD  Dulaglutide (TRULICITY) 1.5 UK/0.2RK SOPN Inject 1.5 mg into the skin once a week. 07/05/22   [name],  NP  DULoxetine (CYMBALTA) 60 MG capsule Take 1 capsule (60 mg total) by mouth daily. 07/05/22   [name], NP  EPINEPHrine 0.3 mg/0.3 mL IJ SOAJ injection Inject 0.3 mg into the muscle as needed for anaphylaxis. 06/25/20   [name], NP  fluticasone (FLONASE) 50 MCG/ACT nasal spray Place 2 sprays into both nostrils daily as needed for allergies or rhinitis. 07/05/22   [name], NP  glucose blood (ACCU-CHEK GUIDE) test strip USE TO CHECK BLOOD SUGAR 3 TIMES DAILY 04/03/22   Charlott Rakes, MD  guaiFENesin 200 MG tablet  Take 1 tablet (200 mg total) by mouth every 4 (four) hours as needed for cough or to loosen phlegm. 07/05/22   [name], NP  insulin lispro (HUMALOG KWIKPEN) 100 UNIT/ML KwikPen Inject 10 Units into the skin 3 (three) times daily. 07/05/22   [name], NP  isosorbide mononitrate (IMDUR) 30 MG 24 hr tablet Take 0.5 tablets (15 mg total) by mouth daily. 03/18/22 06/20/22  Arrien, Jimmy Picket, MD  KLOR-CON M20 20 MEQ tablet Take 20 mEq by mouth 2 (two) times daily. 04/18/22   [provider]  lidocaine-prilocaine (EMLA) cream SMARTSIG:sparingly Topical 3 Times a Week 06/01/22   [provider]  linaclotide (LINZESS) 72 MCG capsule TAKE 1 CAPSULE (72 MCG TOTAL) BY MOUTH DAILY BEFORE BREAKFAST. IF BM NOT ACHIEVE WITH 1 CAPSULE SHE MAY TAKE 2 DAILY. STOP IF DIARRHEA OCCURS 07/05/22   [name], NP  loratadine (CLARITIN) 10 MG tablet TAKE ONE TABLET BY MOUTH DAILY AT 9AM 09/06/22   [name], NP  metolazone (ZAROXOLYN) 2.5 MG tablet Take 2.5 mg by mouth 2 (two) times a week. 04/20/22   [provider]  metoprolol succinate (TOPROL-XL) 25 MG 24 hr tablet TAKE 1 TABLET BY MOUTH IN THE MORNING AND AT BEDTIME 09/12/22   Vickie Epley, MD  Multiple Vitamins-Minerals (MULTIVITAMIN WOMEN PO) Take 1 tablet by mouth daily.    [provider]  multivitamin (RENA-VIT) TABS tablet Take 1 tablet by mouth daily. 04/27/22   [provider]  nitroGLYCERIN (NITROSTAT) 0.4 MG SL tablet Place 1 tablet (0.4 mg total) under the tongue every 5 (five) minutes x 3 doses as needed for chest pain. 01/08/14   Charolette Forward, MD  ondansetron (ZOFRAN) 4 MG tablet Take 1 tablet (4 mg total) by mouth every 8 (eight) hours as needed for nausea or vomiting. 08/26/22   [name], NP  ondansetron (ZOFRAN) 4 MG tablet Take 1 tablet (4 mg total) by mouth every 6 (six) hours. 09/03/22   Sherrell Puller, PA-C  pantoprazole (PROTONIX) 40 MG tablet TAKE ONE  TABLET BY MOUTH DAILY AT 9AM 04/17/22   [name], NP  polyethylene glycol powder (GLYCOLAX/MIRALAX) 17 GM/SCOOP powder Take 17 g by mouth daily. Patient taking differently: Take 17 g by mouth daily as needed for mild constipation. 02/25/22   Shelly Coss, MD  pregabalin (LYRICA) 100 MG capsule Take 1 capsule (100 mg total) by mouth 3 (three) times daily as needed (for pain). 07/05/22   [name], NP  RESTASIS 0.05 % ophthalmic emulsion Place 1 drop into both eyes in the morning and at bedtime. 11/19/17   [provider]  rosuvastatin (CRESTOR) 10 MG tablet TAKE ONE TABLET BY MOUTH DAILY AT 5PM 08/16/22   Joette Catching, PA-C  senna-docusate (SENOKOT-S) 8.6-50 MG tablet Take 1 tablet by mouth 2 (two) times daily. 02/24/22   Shelly Coss,  MD  torsemide (DEMADEX) 100 MG tablet Take 1 tablet by mouth. Monday Wednesday and Friday 04/02/22   [provider]  traMADol (ULTRAM) 50 MG tablet Take 50 mg by mouth every 6 (six) hours as needed. 09/06/22   [provider]      Allergies    Bee pollen, Bee venom, and Sulfa antibiotics    Review of Systems   Review of Systems  All other systems reviewed and are negative.   Physical Exam Updated Vital Signs BP 110/70   Pulse 76   Temp 97.8 F (36.6 C) (Oral)   Resp 15   Ht '5\' 7"'$  (1.702 m)   Wt 131.8 kg   SpO2 100%   BMI 45.51 kg/m  Physical Exam Vitals and nursing note reviewed.  Constitutional:      Appearance: She is well-developed.  HENT:     Head: Atraumatic.  Cardiovascular:     Rate and Rhythm: Normal rate.     Heart sounds: Normal heart sounds.  Pulmonary:     Effort: Pulmonary effort is normal.  Musculoskeletal:     Cervical back: Normal range of motion and neck supple.     Right lower leg: No edema.     Left lower leg: No edema.  Skin:    General: Skin is warm and dry.  Neurological:     Mental Status: She is alert and oriented to person, place, and time.     ED Results  / Procedures / Treatments   Labs (all labs ordered are listed, but only abnormal results are displayed) Labs Reviewed  COMPREHENSIVE METABOLIC PANEL - Abnormal; Notable for the following components:      Result Value   Chloride 93 (*)    BUN 33 (*)    Creatinine, Ser 8.20 (*)    Total Protein 8.6 (*)    GFR, Estimated 5 (*)    All other components within normal limits  CBC WITH DIFFERENTIAL/PLATELET - Abnormal; Notable for the following components:   WBC 3.7 (*)    RBC 3.46 (*)    Hemoglobin 10.6 (*)    HCT 32.1 (*)    Neutro Abs 1.6 (*)    All other components within normal limits  CULTURE, BLOOD (ROUTINE X 2)  CULTURE, BLOOD (ROUTINE X 2)  LACTIC ACID, PLASMA  LACTIC ACID, PLASMA  URINALYSIS, ROUTINE W REFLEX MICROSCOPIC  CBG MONITORING, ED  TROPONIN I (HIGH SENSITIVITY)  TROPONIN I (HIGH SENSITIVITY)    EKG EKG Interpretation  Date/Time:  Thursday September 14 2022 06:29:29 EST Ventricular Rate:  82 PR Interval:    QRS Duration: 115 QT Interval:  440 QTC Calculation: 514 R Axis:   146 Text Interpretation: Accelerated junctional rhythm vs AF Nonspecific intraventricular conduction delay Low voltage, precordial leads Nonspecific T abnrm, anterolateral leads No significant change since last tracing Confirmed by Varney Biles ([id number]) on 09/14/2022 7:38:48 AM  Radiology DG Chest Port 1 View  Result Date: 09/14/2022 CLINICAL DATA:  Chest pain. EXAM: PORTABLE CHEST 1 VIEW COMPARISON:  September 03, 2022. FINDINGS: Stable cardiomegaly. Sternotomy wires are noted. Right internal jugular dialysis catheter is unchanged. Both lungs are clear. The visualized skeletal structures are unremarkable. IMPRESSION: No active disease. Electronically Signed   By: Marijo Conception M.D.   On: 09/14/2022 08:00    Procedures Procedures    Medications Ordered in ED Medications  fentaNYL (SUBLIMAZE) injection 50 mcg (50 mcg Intravenous Not Given 09/14/22 1229)  haloperidol lactate (HALDOL)  injection 5 mg (5  mg Intravenous Given 09/14/22 0754)  diphenhydrAMINE (BENADRYL) injection 25 mg (25 mg Intravenous Given 09/14/22 1042)    ED Course/ Medical Decision Making/ A&P             HEART Score: 6                Medical Decision Making This patient presents to the ED with chief complaint(s) of chest pain described as heaviness with pertinent past medical history of ESRD on HD, hypertension, hyperlipidemia, diabetes, pulm hypertension, CHF, DVT.The complaint involves an extensive differential diagnosis and also carries with it a high risk of complications and morbidity.  Of note, patient has been feeling unwell for 1 week with subjective fevers.  She has been experiencing some nausea, generalized abdominal pain with vomiting.  Differential diagnosis considered includes: ACS syndrome CHF exacerbation Valvular disorder Myocarditis Pericarditis Endocarditis Pericardial effusion / tamponade Pneumonia Pleural effusion / Pulmonary edema PE Pneumothorax Musculoskeletal pain PUD / Gastritis / Esophagitis Esophageal spasm Severe anemia Viral illness Gastroparesis  The initial plan is to get basic labs including troponin. On exam there is no volume overload and patient does not have any focal abnormality on lung exam.  She does not appear to have volume overload and there is no signs of unilateral leg swelling.  Patient's abdominal exam is reassuring.  There is tenderness but no peritonitis.  Additional history obtained: Additional history obtained from family Records reviewed previous admission documents and previous CT scans, including a CT scan that was completed earlier this year and 1 that was completed few months back both negative for any acute process.. I also reviewed right-sided heart cath from 2022 and left-sided heart cath from 2019, both reassuring.  Independent labs interpretation:  The following labs were independently interpreted: Patient's initial  high-sensitivity troponin is reassuring.  CBC shows no profound anemia.  Electrolytes are reassuring.  Initial EKG does not show any evidence of acute MI.  EKG overall reassuring from ischemia perspective, no evidence of new changes.  Independent visualization and interpretation of imaging: - I independently visualized the following imaging with scope of interpretation limited to determining acute life threatening conditions related to emergency care: X-ray of the chest, which revealed no evidence of pneumonia, pneumothorax  Treatment and Reassessment: Initial thought was that patient could have gastroparesis.  She has taken oral Zofran at home without significant help.  We gave her Haldol. Upon reassessment, patient states that Haldol makes her feel a little off.  We give her Benadryl and reassessed her again.  Upon reassessment she states that she is feeling a lot better.  Nausea resolved after Haldol.  She is not having chest pain.  Her high-sensitivity troponin x 2 are below cutoff for myocardial injury.  I discussed with the patient findings in the emergency room including her troponin results, x-ray results, EKG and labs. We did order blood culture given that she has a right-sided tunneled cath and she is having nausea and vomiting. Working diagnosis is that patient is having gastroparesis leading to the chest discomfort, nausea and vomiting. Unclear what to make of the chest heaviness she had earlier today.  Return precautions for ACS discussed, but advised that patient follows up with for further assessment to see if she needs provocative testing.  High heart score.  Problems Addressed: Gastroparesis: acute illness or injury Nausea and vomiting, unspecified vomiting type: acute illness or injury Precordial chest pain: acute illness or injury  Amount and/or Complexity of Data Reviewed Labs: ordered. Radiology: ordered.  Risk Prescription drug management.    Final Clinical  Impression(s) / ED Diagnoses Final diagnoses:  Gastroparesis  Nausea and vomiting, unspecified vomiting type  Precordial chest pain    Rx / DC Orders ED Discharge Orders     None         Varney Biles, MD 09/14/22 1238

## 2022-09-14 NOTE — Discharge Instructions (Addendum)
We saw you in the ER for the chest pain/shortness of breath. All of our cardiac workup is normal, including labs, EKG and chest X-RAY are normal. We are not sure what is causing your discomfort, but we feel comfortable sending you home at this time. The workup in the ER is not complete, and you should follow up with your primary care doctor for further evaluation.  Also, the nausea and vomiting is likely related to gastroparesis.  Take the Zofran as discussed every 6 hours regardless of symptoms for the next 48 hours and try to avoid heavy meals, large meals, red meat.  Please return to the ER if you have worsening chest pain, shortness of breath, pain radiating to your jaw, shoulder, or back, sweats or fainting. Otherwise see the Cardiologist or your primary care doctor as requested.

## 2022-09-14 NOTE — ED Notes (Signed)
Patient assisted up to a bedside recliner due to her dizziness.  Patient remains on cardiac monitoring.

## 2022-09-14 NOTE — ED Notes (Signed)
Patient reports she has had a cough and a fever.

## 2022-09-18 ENCOUNTER — Encounter: Payer: Self-pay | Admitting: Family Medicine

## 2022-09-18 ENCOUNTER — Ambulatory Visit: Payer: 59 | Attending: Family Medicine | Admitting: Family Medicine

## 2022-09-18 DIAGNOSIS — E669 Obesity, unspecified: Secondary | ICD-10-CM

## 2022-09-18 DIAGNOSIS — E1169 Type 2 diabetes mellitus with other specified complication: Secondary | ICD-10-CM | POA: Diagnosis not present

## 2022-09-18 NOTE — Progress Notes (Signed)
Virtual Visit via Telephone Note  I connected with [name], on 09/18/2022 at 1:37 PM by telephone and verified that I am speaking with the correct person using two identifiers.   Consent: I discussed the limitations, risks, security and privacy concerns of performing an evaluation and management service by telephone and the availability of in person appointments. I also discussed with the patient that there may be a patient responsible charge related to this service. The patient expressed understanding and agreed to proceed.   Location of Patient: Home  Location of Provider: Clinic   Persons participating in Telemedicine visit: [name] Dr. Margarita Rana     History of Present Illness: [name] is a 67 y.o. year old female patient of Juluis Mire, NP with history of ESRD, type 2 diabetes mellitus (A1c 6.7), CHF. I am seeing her today because she requires form completion for diabetic shoes which needs to be signed by physician as her PCP is a [employer], [employer]ell[employer]. She is currently on Trulicity 1.5 mg once a week and Humalog 5 units twice daily for management of her diabetes.  Her last A1c was 6.7 in 11/2021. 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  She is currently under podiatry care with her last visit last week when her nailbeds with debrided. Denies additional concerns today.   Past Medical History:  Diagnosis Date   Acute on chronic systolic CHF (congestive heart failure) (HCC) 12/21/2020   Allergic rhinitis    Arthritis    Asthma    Chronic diastolic CHF (congestive heart failure) (HCC)    COPD (chronic obstructive pulmonary disease) (HCC)    Depression    DM (diabetes mellitus) ([place])    DVT (deep vein thrombosis) in pregnancy    GERD (gastroesophageal reflux disease)    HTN (hypertension)    Hyperlipidemia    Obesity    Pneumonia 04/2018   RIGHT LOBE    Sleep apnea    compliant with CPAP   Allergies  Allergen Reactions   Bee Pollen Anaphylaxis and Other (See Comments)    UNCONFIRMED, but IS allergic to bee VENOM   Bee Venom Anaphylaxis   Sulfa Antibiotics Itching    Current Outpatient Medications on File Prior to Visit  Medication Sig Dispense Refill   acetaminophen (TYLENOL) 325 MG tablet Take 2 tablets (650 mg total) by mouth every 6 (six) hours as needed for mild pain or moderate pain.     albuterol (VENTOLIN HFA) 108 (90 Base) MCG/ACT inhaler INHALE 1-2 PUFFS BY MOUTH EVERY 6 HOURS AS NEEDED FOR WHEEZE OR SHORTNESS OF BREATH 18 each 1   amiodarone (PACERONE) 200 MG tablet TAKE 1 TABLET BY MOUTH TWICE A DAY 180 tablet 3   amoxicillin (AMOXIL) 500 MG capsule Take 500 mg by mouth 3 (three) times daily.     apixaban (ELIQUIS) 5 MG TABS tablet TAKE ONE TABLET BY MOUTH TWICE DAILY at [place] (Patient taking differently: Take 5 mg by mouth in the morning and at bedtime.) 60 tablet 1   benzonatate (TESSALON PERLES) 100 MG capsule Take 1 capsule (100 mg total) by mouth 3 (three) times daily as needed for cough. 20 capsule 0   Blood Glucose Monitoring Suppl (ACCU-CHEK GUIDE) w/Device KIT Use to check blood sugar TID. Dx: E11.69 1 kit 0   busPIRone (BUSPAR) 5 MG tablet TAKE ONE  TABLET BY MOUTH TWICE DAILY @ 9AM & 5PM 180 tablet 11   colchicine 0.6 MG tablet TAKE 0.5 TABLETS BY MOUTH DAILY. 45 tablet 0   diphenhydrAMINE (BENADRYL) 25 mg capsule Take 25 mg by mouth every 6 (six) hours as needed for allergies or itching.     doxycycline (VIBRAMYCIN) 100 MG capsule Take 1 capsule (100 mg total) by mouth 2 (two) times daily. 6 capsule 0   Dulaglutide (TRULICITY) 1.5 ZD/6.6YQ SOPN Inject 1.5 mg into the skin once a week. 2 mL 2   DULoxetine (CYMBALTA) 60 MG capsule Take 1 capsule (60 mg total) by mouth daily. 180 capsule 1   EPINEPHrine 0.3 mg/0.3 mL IJ SOAJ injection Inject 0.3 mg into the muscle as needed for anaphylaxis. 1 each 1   fluticasone  (FLONASE) 50 MCG/ACT nasal spray Place 2 sprays into both nostrils daily as needed for allergies or rhinitis. 16 g 3   glucose blood (ACCU-CHEK GUIDE) test strip USE TO CHECK BLOOD SUGAR 3 TIMES DAILY 100 strip 3   guaiFENesin 200 MG tablet Take 1 tablet (200 mg total) by mouth every 4 (four) hours as needed for cough or to loosen phlegm. 30 suppository 0   insulin lispro (HUMALOG KWIKPEN) 100 UNIT/ML KwikPen Inject 10 Units into the skin 3 (three) times daily. 15 mL 3   isosorbide mononitrate (IMDUR) 30 MG 24 hr tablet Take 0.5 tablets (15 mg total) by mouth daily. 15 tablet 0   KLOR-CON M20 20 MEQ tablet Take 20 mEq by mouth 2 (two) times daily.     lidocaine-prilocaine (EMLA) cream SMARTSIG:sparingly Topical 3 Times a Week     linaclotide (LINZESS) 72 MCG capsule TAKE 1 CAPSULE (72 MCG TOTAL) BY MOUTH DAILY BEFORE BREAKFAST. IF BM NOT ACHIEVE WITH 1 CAPSULE SHE MAY TAKE 2 DAILY. STOP IF DIARRHEA OCCURS 60 capsule 3   loratadine (CLARITIN) 10 MG tablet TAKE ONE TABLET BY MOUTH DAILY AT 9AM 30 tablet 11   metolazone (ZAROXOLYN) 2.5 MG tablet Take 2.5 mg by mouth 2 (two) times a week.     metoprolol succinate (TOPROL-XL) 25 MG 24 hr tablet TAKE 1 TABLET BY MOUTH IN THE MORNING AND AT BEDTIME 180 tablet 1   Multiple Vitamins-Minerals (MULTIVITAMIN WOMEN PO) Take 1 tablet by mouth daily.     multivitamin (RENA-VIT) TABS tablet Take 1 tablet by mouth daily.     nitroGLYCERIN (NITROSTAT) 0.4 MG SL tablet Place 1 tablet (0.4 mg total) under the tongue every 5 (five) minutes x 3 doses as needed for chest pain. 25 tablet 12   ondansetron (ZOFRAN) 4 MG tablet Take 1 tablet (4 mg total) by mouth every 8 (eight) hours as needed for nausea or vomiting. 30 tablet 1   ondansetron (ZOFRAN) 4 MG tablet Take 1 tablet (4 mg total) by mouth every 6 (six) hours. 7 tablet 0   pantoprazole (PROTONIX) 40 MG tablet TAKE ONE TABLET BY MOUTH DAILY AT 9AM 90 tablet 11   polyethylene glycol powder (GLYCOLAX/MIRALAX) 17  GM/SCOOP powder Take 17 g by mouth daily. (Patient taking differently: Take 17 g by mouth daily as needed for mild constipation.) 238 g 0   pregabalin (LYRICA) 100 MG capsule Take 1 capsule (100 mg total) by mouth 3 (three) times daily as needed (for pain). 90 capsule 2   RESTASIS 0.05 % ophthalmic emulsion Place 1 drop into both eyes in the morning and at bedtime.  12   rosuvastatin (CRESTOR) 10 MG tablet TAKE ONE TABLET BY MOUTH  DAILY AT 5PM 90 tablet 0   senna-docusate (SENOKOT-S) 8.6-50 MG tablet Take 1 tablet by mouth 2 (two) times daily. 14 tablet 0   torsemide (DEMADEX) 100 MG tablet Take 1 tablet by mouth. Monday Wednesday and Friday     traMADol (ULTRAM) 50 MG tablet Take 50 mg by mouth every 6 (six) hours as needed.     No current facility-administered medications on file prior to visit.    ROS: See HPI  Observations/Objective: Awake, alert, oriented x 3 Not in acute distress Normal mood.     Latest Ref Rng & Units 09/14/2022    6:56 AM 09/03/2022    1:22 PM 06/28/2022    6:29 PM  CMP  Glucose 70 - 99 mg/dL 93  94  91   BUN 8 - 23 mg/dL 33  24  36   Creatinine 0.44 - 1.00 mg/dL 8.20  6.65  8.80   Sodium 135 - 145 mmol/L 136  138  140   Potassium 3.5 - 5.1 mmol/L 4.4  4.2  4.2   Chloride 98 - 111 mmol/L 93  95  98   CO2 22 - 32 mmol/L 32  28    Calcium 8.9 - 10.3 mg/dL 10.3  10.0    Total Protein 6.5 - 8.1 g/dL 8.6     Total Bilirubin 0.3 - 1.2 mg/dL 0.6     Alkaline Phos 38 - 126 U/L 88     AST 15 - 41 U/L 21     ALT 0 - 44 U/L 14       Lipid Panel     Component Value Date/Time   CHOL 158 05/03/2021 0603   CHOL 140 06/24/2020 1054   TRIG 83 05/03/2021 0603   HDL 50 05/03/2021 0603   HDL 46 06/24/2020 1054   CHOLHDL 3.2 05/03/2021 0603   VLDL 17 05/03/2021 0603   LDLCALC 91 05/03/2021 0603   LDLCALC 75 06/24/2020 1054   LABVLDL 19 06/24/2020 1054    Lab Results  Component Value Date   HGBA1C 6.7 (H) 11/21/2021    Assessment and Plan: 1. Diabetes  mellitus type 2 in obese ([place]) Controlled with A1c of 6.7 She is overdue for an A1c as last A1c was in 11/2021 Advised to schedule an appointment with PCP to have this checked Continue Trulicity 1.5 mg weekly and Humalog 5 units twice daily Given previous history of diabetic ulcer in her right foot she is at high risk and will benefit from having diabetic shoes. I have completed form and we will fax this over. Counseled on Diabetic diet, my plate method, 326 minutes of moderate intensity exercise/week Blood sugar logs with fasting goals of 80-120 mg/dl, random of less than 180 and in the event of sugars less than 60 mg/dl or greater than 400 mg/dl encouraged to notify the clinic. Advised on the need for annual eye exams, annual foot exams, Pneumonia vaccine.   Follow Up Instructions: She will call her PCPs office to schedule an appointment.   I discussed the assessment and treatment plan with the patient. The patient was provided an opportunity to ask questions and all were answered. The patient agreed with the plan and demonstrated an understanding of the instructions.   The patient was advised to call back or seek an in-person evaluation if the symptoms worsen or if the condition fails to improve as anticipated.     I provided 11 minutes total of non-face-to-face time during this encounter.   Nimrat Woolworth  Margarita Rana, MD, FAAFP. [facility] [place], [place]   09/18/2022, 1:37 PM

## 2022-09-19 LAB — CULTURE, BLOOD (ROUTINE X 2)
Culture: NO GROWTH
Culture: NO GROWTH
Special Requests: ADEQUATE
Special Requests: ADEQUATE

## 2022-09-20 ENCOUNTER — Other Ambulatory Visit (INDEPENDENT_AMBULATORY_CARE_PROVIDER_SITE_OTHER): Payer: Self-pay | Admitting: Primary Care

## 2022-09-20 ENCOUNTER — Ambulatory Visit: Payer: 59

## 2022-09-20 ENCOUNTER — Ambulatory Visit: Payer: Medicare Other | Admitting: Neurology

## 2022-09-20 ENCOUNTER — Encounter: Payer: Self-pay | Admitting: Neurology

## 2022-09-21 NOTE — Telephone Encounter (Signed)
Requested Prescriptions  Pending Prescriptions Disp Refills   albuterol (VENTOLIN HFA) 108 (90 Base) MCG/ACT inhaler [Pharmacy Med Name: ALBUTEROL HFA (PROVENTIL) INH] 6.7 each 0    Sig: INHALE 1-2 PUFFS BY MOUTH EVERY 6 HOURS AS NEEDED FOR WHEEZE OR SHORTNESS OF BREATH     Pulmonology:  Beta Agonists 2 Passed - 09/20/2022  5:41 PM      Passed - Last BP in normal range    BP Readings from Last 1 Encounters:  09/14/22 122/77         Passed - Last Heart Rate in normal range    Pulse Readings from Last 1 Encounters:  09/14/22 76         Passed - Valid encounter within last 12 months    Recent Outpatient Visits           3 days ago Diabetes mellitus type 2 in obese [facility])   [place], Charlane Ferretti, MD   2 months ago Constipation, unspecified constipation type   [place] [facility] [name], NP   4 months ago Cough productive of purulent sputum   [place] [facility] [name], NP   4 months ago Adjustment disorder with mixed anxiety and depressed mood   [place] [facility] [name], NP   8 months ago Acute frontal sinusitis, recurrence not specified   [facility] [name], NP

## 2022-09-22 ENCOUNTER — Encounter (INDEPENDENT_AMBULATORY_CARE_PROVIDER_SITE_OTHER): Payer: Self-pay | Admitting: Primary Care

## 2022-09-22 ENCOUNTER — Ambulatory Visit (INDEPENDENT_AMBULATORY_CARE_PROVIDER_SITE_OTHER): Payer: 59 | Admitting: Primary Care

## 2022-09-22 VITALS — BP 101/55 | HR 98 | Resp 16 | Wt 281.4 lb

## 2022-09-22 DIAGNOSIS — E669 Obesity, unspecified: Secondary | ICD-10-CM

## 2022-09-22 DIAGNOSIS — J0110 Acute frontal sinusitis, unspecified: Secondary | ICD-10-CM | POA: Diagnosis not present

## 2022-09-22 DIAGNOSIS — Z6841 Body Mass Index (BMI) 40.0 and over, adult: Secondary | ICD-10-CM

## 2022-09-22 DIAGNOSIS — R058 Other specified cough: Secondary | ICD-10-CM

## 2022-09-22 DIAGNOSIS — E1169 Type 2 diabetes mellitus with other specified complication: Secondary | ICD-10-CM | POA: Diagnosis not present

## 2022-09-22 DIAGNOSIS — J329 Chronic sinusitis, unspecified: Secondary | ICD-10-CM

## 2022-09-22 MED ORDER — FLUTICASONE PROPIONATE 50 MCG/ACT NA SUSP: 2.0000 | Freq: Every day | NASAL | 3 refills | Status: DC | PRN

## 2022-09-22 MED ORDER — HYDROCOD POLI-CHLORPHE POLI ER 10-8 MG/5ML PO SUER: 5.0000 mL | Freq: Two times a day (BID) | ORAL | 0 refills | Status: DC | PRN

## 2022-09-25 ENCOUNTER — Ambulatory Visit (INDEPENDENT_AMBULATORY_CARE_PROVIDER_SITE_OTHER): Payer: Medicaid Other | Admitting: Podiatry

## 2022-09-25 DIAGNOSIS — Z91199 Patient's noncompliance with other medical treatment and regimen due to unspecified reason: Secondary | ICD-10-CM

## 2022-09-25 NOTE — Progress Notes (Signed)
1. No-show for appointment

## 2022-09-28 ENCOUNTER — Telehealth: Payer: Self-pay | Admitting: Primary Care

## 2022-09-28 ENCOUNTER — Telehealth (INDEPENDENT_AMBULATORY_CARE_PROVIDER_SITE_OTHER): Payer: Self-pay | Admitting: *Deleted

## 2022-09-28 ENCOUNTER — Other Ambulatory Visit (INDEPENDENT_AMBULATORY_CARE_PROVIDER_SITE_OTHER): Payer: Self-pay | Admitting: Primary Care

## 2022-09-28 ENCOUNTER — Other Ambulatory Visit: Payer: Self-pay | Admitting: *Deleted

## 2022-09-28 ENCOUNTER — Encounter (HOSPITAL_COMMUNITY): Payer: Self-pay | Admitting: *Deleted

## 2022-09-28 MED ORDER — ALBUTEROL SULFATE HFA 108 (90 BASE) MCG/ACT IN AERS: INHALATION_SPRAY | RESPIRATORY_TRACT | 0 refills | Status: DC

## 2022-09-28 MED ORDER — DEXTROMETHORPHAN HBR 15 MG PO CAPS: 15.0000 mg | ORAL_CAPSULE | Freq: Two times a day (BID) | ORAL | 0 refills | Status: DC | PRN

## 2022-09-28 NOTE — Telephone Encounter (Signed)
Requested medication (s) are due for refill today: resent  Requested medication (s) are on the active medication list: yes    Last refill: 09/21/22  Future visit scheduled no  Notes to clinic:[facility] pharmacy, rx not received, resent as ordered  Requested Prescriptions  Pending Prescriptions Disp Refills   albuterol (VENTOLIN HFA) 108 (90 Base) MCG/ACT inhaler 6.7 each 0     Pulmonology:  Beta Agonists 2 Passed - 09/28/2022  1:50 PM      Passed - Last BP in normal range    BP Readings from Last 1 Encounters:  09/22/22 (!) 101/55         Passed - Last Heart Rate in normal range    Pulse Readings from Last 1 Encounters:  09/22/22 98         Passed - Valid encounter within last 12 months    Recent Outpatient Visits           6 days ago Diabetes mellitus type 2 in obese [facility])   [place], Michelle P, NP   1 week ago Diabetes mellitus type 2 in obese [facility])   [place], Charlane Ferretti, MD   2 months ago Constipation, unspecified constipation type   [place] [facility] [name], NP   4 months ago Cough productive of purulent sputum   [place] [facility] [name], NP   4 months ago Adjustment disorder with mixed anxiety and depressed mood   [place] [facility] [name], NP

## 2022-09-28 NOTE — Telephone Encounter (Signed)
Will forward to provider. 1 rx was sent to you and the other rx was sent by the ED provider

## 2022-09-28 NOTE — Telephone Encounter (Signed)
Patient would like rx for albuterol (VENTOLIN HFA) 108 (90 Base) MCG/ACT inhaler to be resent to  [place], [place] Phone: [phone]  Fax: [phone]

## 2022-09-28 NOTE — Telephone Encounter (Signed)
Called patient sent in another cough medication in hopes insurance will pay

## 2022-09-28 NOTE — Telephone Encounter (Signed)
Will forward to provider  

## 2022-09-28 NOTE — Telephone Encounter (Signed)
Copied from [place] [id number]. Topic: General - Inquiry >> Sep 28, 2022  1:36 PM Ludger Nutting wrote: Patient states that her insurance will not pay for chlorpheniramine-HYDROcodone ([place]) 10-8 MG/5ML and would like to know if an alternative can be given. Please advise.

## 2022-09-29 NOTE — Telephone Encounter (Signed)
Will forward to provider  

## 2022-10-01 MED ORDER — ALBUTEROL SULFATE HFA 108 (90 BASE) MCG/ACT IN AERS: INHALATION_SPRAY | RESPIRATORY_TRACT | 1 refills | Status: DC

## 2022-10-01 NOTE — Progress Notes (Signed)
[facility]        Subjective:     [name] is a 67 y.o. female who presents for evaluation of symptoms of a URI. Symptoms include congestion, lightheadedness, productive cough with  white colored sputum, and sneezing. Onset of symptoms was 2 weeks ago, and has been gradually worsening since that time. Treatment to date: decongestants.  The following portions of the patient's history were reviewed and updated as appropriate: allergies, current medications, past family history, past medical history, past social history, past surgical history, and problem list.  Review of Systems Pertinent items noted in HPI and remainder of comprehensive ROS otherwise negative.  Past Medical History:  Diagnosis Date   Acute on chronic systolic CHF (congestive heart failure) (HCC) 12/21/2020   Allergic rhinitis    Arthritis    Asthma    Chronic diastolic CHF (congestive heart failure) (HCC)    COPD (chronic obstructive pulmonary disease) (HCC)    Depression    DM (diabetes mellitus) ([place])    DVT (deep vein thrombosis) in pregnancy    GERD (gastroesophageal reflux disease)    HTN (hypertension)    Hyperlipidemia    Obesity    Pneumonia 04/2018   RIGHT LOBE   Sleep apnea    compliant with CPAP    Current Outpatient Medications on File Prior to Visit  Medication Sig Dispense Refill   acetaminophen (TYLENOL) 325 MG tablet Take 2 tablets (650 mg total) by mouth every 6 (six) hours as needed for mild pain or moderate pain.     amiodarone (PACERONE) 200 MG tablet TAKE 1 TABLET BY MOUTH TWICE A DAY 180 tablet 3   amoxicillin (AMOXIL) 500 MG capsule Take 500 mg by mouth 3 (three) times daily.     apixaban (ELIQUIS) 5 MG TABS tablet TAKE ONE TABLET BY MOUTH TWICE DAILY at [place] (Patient taking differently: Take 5 mg by mouth in the morning and at bedtime.) 60 tablet 1   benzonatate (TESSALON PERLES) 100 MG capsule Take 1 capsule (100 mg total) by mouth 3 (three) times  daily as needed for cough. 20 capsule 0   Blood Glucose Monitoring Suppl (ACCU-CHEK GUIDE) w/Device KIT Use to check blood sugar TID. Dx: E11.69 1 kit 0   busPIRone (BUSPAR) 5 MG tablet TAKE ONE TABLET BY MOUTH TWICE DAILY @ 9AM & 5PM 180 tablet 11   colchicine 0.6 MG tablet TAKE 0.5 TABLETS BY MOUTH DAILY. 45 tablet 0   diphenhydrAMINE (BENADRYL) 25 mg capsule Take 25 mg by mouth every 6 (six) hours as needed for allergies or itching.     doxycycline (VIBRAMYCIN) 100 MG capsule Take 1 capsule (100 mg total) by mouth 2 (two) times daily. 6 capsule 0   Dulaglutide (TRULICITY) 1.5 0000000 SOPN Inject 1.5 mg into the skin once a week. 2 mL 2   DULoxetine (CYMBALTA) 60 MG capsule Take 1 capsule (60 mg total) by mouth daily. 180 capsule 1   EPINEPHrine 0.3 mg/0.3 mL IJ SOAJ injection Inject 0.3 mg into the muscle as needed for anaphylaxis. 1 each 1   glucose blood (ACCU-CHEK GUIDE) test strip USE TO CHECK BLOOD SUGAR 3 TIMES DAILY 100 strip 3   guaiFENesin 200 MG tablet Take 1 tablet (200 mg total) by mouth every 4 (four) hours as needed for cough or to loosen phlegm. 30 suppository 0   insulin lispro (HUMALOG KWIKPEN) 100 UNIT/ML KwikPen Inject 10 Units into the skin 3 (three) times daily. 15 mL  3   isosorbide mononitrate (IMDUR) 30 MG 24 hr tablet Take 0.5 tablets (15 mg total) by mouth daily. 15 tablet 0   KLOR-CON M20 20 MEQ tablet Take 20 mEq by mouth 2 (two) times daily.     lidocaine-prilocaine (EMLA) cream SMARTSIG:sparingly Topical 3 Times a Week     linaclotide (LINZESS) 72 MCG capsule TAKE 1 CAPSULE (72 MCG TOTAL) BY MOUTH DAILY BEFORE BREAKFAST. IF BM NOT ACHIEVE WITH 1 CAPSULE SHE MAY TAKE 2 DAILY. STOP IF DIARRHEA OCCURS 60 capsule 3   loratadine (CLARITIN) 10 MG tablet TAKE ONE TABLET BY MOUTH DAILY AT 9AM 30 tablet 11   metolazone (ZAROXOLYN) 2.5 MG tablet Take 2.5 mg by mouth 2 (two) times a week.     metoprolol succinate (TOPROL-XL) 25 MG 24 hr tablet TAKE 1 TABLET BY MOUTH IN THE  MORNING AND AT BEDTIME 180 tablet 1   Multiple Vitamins-Minerals (MULTIVITAMIN WOMEN PO) Take 1 tablet by mouth daily.     multivitamin (RENA-VIT) TABS tablet Take 1 tablet by mouth daily.     nitroGLYCERIN (NITROSTAT) 0.4 MG SL tablet Place 1 tablet (0.4 mg total) under the tongue every 5 (five) minutes x 3 doses as needed for chest pain. 25 tablet 12   pantoprazole (PROTONIX) 40 MG tablet TAKE ONE TABLET BY MOUTH DAILY AT 9AM 90 tablet 11   polyethylene glycol powder (GLYCOLAX/MIRALAX) 17 GM/SCOOP powder Take 17 g by mouth daily. (Patient taking differently: Take 17 g by mouth daily as needed for mild constipation.) 238 g 0   pregabalin (LYRICA) 100 MG capsule Take 1 capsule (100 mg total) by mouth 3 (three) times daily as needed (for pain). 90 capsule 2   RESTASIS 0.05 % ophthalmic emulsion Place 1 drop into both eyes in the morning and at bedtime.  12   rosuvastatin (CRESTOR) 10 MG tablet TAKE ONE TABLET BY MOUTH DAILY AT 5PM 90 tablet 0   senna-docusate (SENOKOT-S) 8.6-50 MG tablet Take 1 tablet by mouth 2 (two) times daily. 14 tablet 0   torsemide (DEMADEX) 100 MG tablet Take 1 tablet by mouth. Monday Wednesday and Friday     traMADol (ULTRAM) 50 MG tablet Take 50 mg by mouth every 6 (six) hours as needed.     No current facility-administered medications on file prior to visit.    Objective:  Blood Pressure (Abnormal) 101/55   Pulse 98   Respiration 16   Weight 281 lb 6.4 oz (127.6 kg)   Oxygen Saturation 97%   Body Mass Index 44.07 kg/m    Blood Pressure (Abnormal) 101/55   Pulse 98   Respiration 16   Weight 281 lb 6.4 oz (127.6 kg)   Oxygen Saturation 97%   Body Mass Index 44.07 kg/m   General Appearance:    Alert, cooperative, mild distress, appears stated age  Head:    Normocephalic, without obvious abnormality, atraumatic  Eyes:    PERRL, conjunctiva/corneas clear, EOM's intact, fundi    benign, both eyes  Ears:    Normal TM's and external ear canals, both ears  Nose:    Nares normal, septum midline, mucosa normal, bilateral and boggy nares left > right  Throat:   Red   Neck:   Supple, symmetrical, trachea midline, no adenopathy;    thyroid:  no enlargement/tenderness/nodules; no carotid   bruit or JVD  Back:     Symmetric, no curvature, ROM normal, no CVA tenderness  Lungs:     respirations unlabored, scattered wheezes   Chest  Wall:    No tenderness or deformity   Heart:    Regular rate and rhythm, S1 and S2 normal, no murmur, rub   or gallop  Breast Exam:    deferred   Abdomen:     Soft, non-tender, bowel sounds active all four quadrants,    no masses, no organomegaly  Genitalia:  Deferred   Rectal:   Deferred   Extremities:   Extremities normal, atraumatic,+1 edema Bilateral LE  Pulses:   2+ and symmetric all extremities  Skin:   Skin color, texture, turgor normal, no rashes or lesions  Lymph nodes:   Maxillary tender ,Cervical, supraclavicular, and axillary nodes normal  Neurologic:   CNII-XII intact, normal strength, sensation and reflexes    throughout     Assessment:  Runa was seen today for uri.  Diagnoses and all orders for this visit:  Diabetes mellitus type 2 in obese [facility]) -     Ambulatory referral to Podiatry  Chronic sinusitis, unspecified location -     fluticasone (FLONASE) 50 MCG/ACT nasal spray; Place 2 sprays into both nostrils daily as needed for allergies or rhinitis.  Cough productive of purulent sputum -     Discontinue: chlorpheniramine-HYDROcodone (TUSSIONEX) 10-8 MG/5ML; Take 5 mLs by mouth every 12 (twelve) hours as needed for cough.  Acute frontal sinusitis, recurrence not specified -     fluticasone (FLONASE) 50 MCG/ACT nasal spray; Place 2 sprays into both nostrils daily as needed for allergies or rhinitis.     This note has been created with Surveyor, quantity. Any transcriptional errors are unintentional.   [name], NP 10/01/2022, 8:40 PM

## 2022-10-11 ENCOUNTER — Other Ambulatory Visit: Payer: Self-pay | Admitting: *Deleted

## 2022-10-11 DIAGNOSIS — N186 End stage renal disease: Secondary | ICD-10-CM

## 2022-10-13 ENCOUNTER — Other Ambulatory Visit (INDEPENDENT_AMBULATORY_CARE_PROVIDER_SITE_OTHER): Payer: Self-pay | Admitting: Primary Care

## 2022-10-13 MED ORDER — ALBUTEROL SULFATE HFA 108 (90 BASE) MCG/ACT IN AERS: 1.0000 | INHALATION_SPRAY | Freq: Four times a day (QID) | RESPIRATORY_TRACT | 2 refills | Status: DC | PRN

## 2022-10-20 ENCOUNTER — Telehealth: Payer: Self-pay | Admitting: Primary Care

## 2022-10-20 ENCOUNTER — Encounter (HOSPITAL_COMMUNITY): Payer: Self-pay | Admitting: Physician Assistant

## 2022-10-20 ENCOUNTER — Ambulatory Visit (HOSPITAL_COMMUNITY)
Admission: RE | Admit: 2022-10-20 | Discharge: 2022-10-20 | Disposition: A | Discharge status: Home / Self Care | Payer: 59 | Source: Ambulatory Visit | Attending: Physician Assistant | Admitting: Physician Assistant

## 2022-10-20 VITALS — BP 122/80 | HR 77 | Ht 67.0 in | Wt 279.6 lb

## 2022-10-20 DIAGNOSIS — I4892 Unspecified atrial flutter: Secondary | ICD-10-CM | POA: Diagnosis not present

## 2022-10-20 DIAGNOSIS — E1122 Type 2 diabetes mellitus with diabetic chronic kidney disease: Secondary | ICD-10-CM | POA: Diagnosis not present

## 2022-10-20 DIAGNOSIS — Z5181 Encounter for therapeutic drug level monitoring: Secondary | ICD-10-CM

## 2022-10-20 DIAGNOSIS — I132 Hypertensive heart and chronic kidney disease with heart failure and with stage 5 chronic kidney disease, or end stage renal disease: Secondary | ICD-10-CM | POA: Diagnosis not present

## 2022-10-20 DIAGNOSIS — I4819 Other persistent atrial fibrillation: Secondary | ICD-10-CM

## 2022-10-20 DIAGNOSIS — Z79899 Other long term (current) drug therapy: Secondary | ICD-10-CM

## 2022-10-20 DIAGNOSIS — I5022 Chronic systolic (congestive) heart failure: Secondary | ICD-10-CM | POA: Insufficient documentation

## 2022-10-20 DIAGNOSIS — D6869 Other thrombophilia: Secondary | ICD-10-CM

## 2022-10-20 DIAGNOSIS — N186 End stage renal disease: Secondary | ICD-10-CM | POA: Insufficient documentation

## 2022-10-20 DIAGNOSIS — Z7901 Long term (current) use of anticoagulants: Secondary | ICD-10-CM | POA: Diagnosis not present

## 2022-10-20 NOTE — Telephone Encounter (Signed)
Copied from [place] [id number]. Topic: Referral - Status >> Oct 20, 2022 11:45 AM Cyndi Bender wrote: Reason for CRM: Pt stated she saw her doctor for Afib and she was told to have her pcp send a referral to GI due to nausea and stomach pain. Pt stated her stomach hurts all the time and she can not eat or drink.

## 2022-10-20 NOTE — Telephone Encounter (Signed)
Will forward to provider  

## 2022-10-20 NOTE — Progress Notes (Signed)
Primary Care Physician: Leslie Perna, NP Referring Physician: Dr [name] [name] is a 67 y.o. female with , type 2 DM, morbid obesity, CKD, a a hx of  many  years of longstanding persisitent afib ( ekg's reviewed from all the back to 2011 show persistent  afib) , previoulsy  followed by Dr. Terrence Gallagher until recently, established with  Dr. Harrell Gallagher, 03/31/20.  She set her up for a cardioversion, she did convert to SR, but she had ERAF possibly after just a few hours. 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 She  did at one time weigh around 500-600 lbs. Her left atrium size is 5.3 indicating severe left atrial enlargement.She  is bothered with fatigue and shortness of breath. She is on pradaxa 150 mg bid. Dr. Harrell Gallagher wanted me to discuss tikosyn admit with the pt.  She was admitted for [facility] 11/02/20. Unfortunately, Tikosyn did not work to keep her in [place]. She was started on amiodarone 200 mg bid and d/c home. She then went on to have  an hospitalization in May 2022,  with acute on chronic HF, wight up 30 lbs and was diuresed. She then went on to  Mitral and Tricuspid Valvular ring annuloplasty, Left and Right sided MAZE procedure, and clipping of her Left Atrial Appendage with Dr. Roxan Gallagher.  She tolerated the procedure without difficulty and was taken to the SICU in stable condition.  she was in NSR after surgery. SHe was followed by HF team. Her creatinine peaked at 4.36. She was supported on IV milrinone  for several days after surgery.   She then was hospitalized again 6/11 thru 6/15 with acute exacerbation of CHF. Again diuresed and had cardioversion. She was in SR on discharge.   Admitted again 02/07/21 thru 02/10/21 with substernal chest pain, nausea/vomiting and abdominal pain. She was back in afib with RVR. She again had repeat cardioversion.   She again had admission  02/25/21 with afib with RVR. She was out of amiodarone x one  week. She was started on IV amiodarone and converted. SHe was discharged on 200 mg bid of amiodarone.  EP consulted and wanted to defer treatment for 6 months until repeat echo. At that time there was discussion of PPM with AV nodal ablation.  She  was then seen acutely  by Leslie Kilts, PA, for Dr. Rayann Gallagher,  8/25 for being in persistent afib. She was not felt to be a current candidate for PPM with AV nodal ablation as she had just had Maze procedure in May. She was set up for cardioversion 04/19/21.   F/u in Afib clinic, 04/26/21, for successful cardioversion 04/19/21.  She was back in afib with v rate at 106 bpm. Discussion with HF and torsemide was increased.   She saw Dr. Rayann Gallagher on 05/30/21 and was in [place]. Plan was to continue amiodarone and eliquis.   Admitted 4/23 with recurrent HR in the setting of recurrent AF. Underwent DCCV and converted to NSR on 11/25/21.   Seen by Dr. Quentin Gallagher on 01/17/22 and determined to not be a candidate for catheter ablation and not a good candidate for AV junction ablation and PPM. Continued on amiodarone. Readmitted 5/30-6/5 for acute on chronic CHF.  Underwent DCCV on 01/31/22 for atrial flutter and successfully converted to NSR.   Admitted on 03/08/22 due to acute exacerbation of CHF and due to worsening renal function and progression to ESRD underwent  placement of right IJ catheter for HD on 03/14/22. S/p placement of left brachiocephalic fistula on [id number].   HD schedule T-Th-Sat.  Recent ED visits on 09/03/22 and 09/14/22 for chest pain, nausea, and vomiting. ED EKG on 09/04/22 showed 2:1 atrial flutter.   On follow up today, her primary concern today is nausea, stomach ache, and vomiting. She currently denies chest pain or shortness of breath. She feels like her n/v and stomach issues have been going on for a while now. She voices frustration in ability to perform daily activities secondary to current symptoms. She states prescription given for Velphoro to take TID; she  was advised this medication would help breakdown her food more easily. She states it has not helped. She does endorse previously trying a medication that melted in her mouth helped somewhat for her nausea.   She has been compliant with her amiodarone and eliquis. Denies any bleeding concerns.   Today, she denies symptoms of palpitations, chest pain, shortness of breath, orthopnea, PND, lower extremity edema, dizziness, presyncope, syncope, or neurologic sequela. The patient is tolerating medications without difficulties.  Past Medical History:  Diagnosis Date   Acute on chronic systolic CHF (congestive heart failure) (HCC) 12/21/2020   Allergic rhinitis    Arthritis    Asthma    Chronic diastolic CHF (congestive heart failure) (HCC)    COPD (chronic obstructive pulmonary disease) (HCC)    Depression    DM (diabetes mellitus) ([place])    DVT (deep vein thrombosis) in pregnancy    GERD (gastroesophageal reflux disease)    HTN (hypertension)    Hyperlipidemia    Obesity    Pneumonia 04/2018   RIGHT LOBE   Sleep apnea    compliant with CPAP   Past Surgical History:  Procedure Laterality Date   ABDOMINAL HYSTERECTOMY     AV FISTULA PLACEMENT Left 03/17/2022   Procedure: LEFT ARM ARTERIOVENOUS (AV) FISTULA CREATION;  Surgeon: Leslie Mould, MD;  Location: [place];  Service: Vascular;  Laterality: Left;   BUBBLE STUDY  11/26/2020   Procedure: BUBBLE STUDY;  Surgeon: Leslie Dresser, MD;  Location: [place];  Service: Cardiovascular;;   CARDIOVERSION N/A 05/06/2020   Procedure: CARDIOVERSION;  Surgeon: Leslie Dresser, MD;  Location: [place];  Service: Cardiovascular;  Laterality: N/A;   CARDIOVERSION N/A 11/04/2020   Procedure: CARDIOVERSION;  Surgeon: Leslie Perla, MD;  Location: [place];  Service: Cardiovascular;  Laterality: N/A;   CARDIOVERSION N/A 02/01/2021   Procedure: CARDIOVERSION;  Surgeon: Leslie Artist, MD;  Location: [place];   Service: Cardiovascular;  Laterality: N/A;   CARDIOVERSION N/A 02/09/2021   Procedure: CARDIOVERSION;  Surgeon: Leslie Artist, MD;  Location: [place];  Service: Cardiovascular;  Laterality: N/A;   CARDIOVERSION N/A 04/19/2021   Procedure: CARDIOVERSION;  Surgeon: Fay Records, MD;  Location: [place];  Service: Cardiovascular;  Laterality: N/A;   CARDIOVERSION N/A 11/25/2021   Procedure: CARDIOVERSION;  Surgeon: Leslie Artist, MD;  Location: [place];  Service: Cardiovascular;  Laterality: N/A;   CARDIOVERSION N/A 01/31/2022   Procedure: CARDIOVERSION;  Surgeon: Leslie Artist, MD;  Location: [place];  Service: Cardiovascular;  Laterality: N/A;   CATARACT EXTRACTION, BILATERAL     CHOLECYSTECTOMY     COLONOSCOPY WITH PROPOFOL N/A 03/05/2015   Procedure: COLONOSCOPY WITH PROPOFOL;  Surgeon: Carol Ada, MD;  Location: [facility];  Service: Endoscopy;  Laterality: N/A;   DIAGNOSTIC LAPAROSCOPY     FISTULA SUPERFICIALIZATION Left 03/17/2022   Procedure: FISTULA SUPERFICIALIZATION -  LEFT;  Surgeon: Leslie Mould, MD;  Location: [facility];  Service: Vascular;  Laterality: Left;   ingrown hallux Left    IR FLUORO GUIDE CV LINE RIGHT  03/14/2022   IR US GUIDE VASC ACCESS RIGHT  03/14/2022   KNEE SURGERY     LEFT HEART CATH AND CORONARY ANGIOGRAPHY N/A 12/31/2017   Procedure: LEFT HEART CATH AND CORONARY ANGIOGRAPHY;  Surgeon: Charolette Forward, MD;  Location: [place] CV [facility];  Service: Cardiovascular;  Laterality: N/A;   MAZE N/A 12/29/2020   Procedure: MAZE;  Surgeon: [name], MD;  Location: [place];  Service: Open Heart Surgery;  Laterality: N/A;   MITRAL VALVE REPAIR N/A 12/29/2020   Procedure: MITRAL VALVE REPAIR USING CARBOMEDICS ANNULOFLEX RING SIZE 30;  Surgeon: [name], MD;  Location: [place];  Service: Open Heart Surgery;  Laterality: N/A;   RIGHT HEART CATH N/A 12/22/2020   Procedure: RIGHT HEART CATH;  Surgeon: Larey Dresser, MD;   Location: [place] CV LAB;  Service: Cardiovascular;  Laterality: N/A;   RIGHT HEART CATH N/A 01/31/2021   Procedure: RIGHT HEART CATH;  Surgeon: Leslie Artist, MD;  Location: [place] CV LAB;  Service: Cardiovascular;  Laterality: N/A;   RIGHT HEART CATH N/A 07/29/2021   Procedure: RIGHT HEART CATH;  Surgeon: Leslie Artist, MD;  Location: [place] CV LAB;  Service: Cardiovascular;  Laterality: N/A;   RIGHT HEART CATH N/A 11/24/2021   Procedure: RIGHT HEART CATH;  Surgeon: Leslie Artist, MD;  Location: [place] [facility];  Service: Cardiovascular;  Laterality: N/A;   RIGHT HEART CATH N/A 02/14/2022   Procedure: RIGHT HEART CATH;  Surgeon: Leslie Artist, MD;  Location: [place] CV LAB;  Service: Cardiovascular;  Laterality: N/A;   RIGHT HEART CATH N/A 02/22/2022   Procedure: RIGHT HEART CATH;  Surgeon: Larey Dresser, MD;  Location: [place] CV LAB;  Service: Cardiovascular;  Laterality: N/A;   TEE WITHOUT CARDIOVERSION N/A 11/26/2020   Procedure: TRANSESOPHAGEAL ECHOCARDIOGRAM (TEE);  Surgeon: Leslie Dresser, MD;  Location: [facility] ENDOSCOPY;  Service: Cardiovascular;  Laterality: N/A;   TEE WITHOUT CARDIOVERSION N/A 12/29/2020   Procedure: TRANSESOPHAGEAL ECHOCARDIOGRAM (TEE);  Surgeon: [name], MD;  Location: [place];  Service: Open Heart Surgery;  Laterality: N/A;   TEE WITHOUT CARDIOVERSION N/A 01/20/2022   Procedure: TRANSESOPHAGEAL ECHOCARDIOGRAM (TEE);  Surgeon: Leslie Artist, MD;  Location: [place];  Service: Cardiovascular;  Laterality: N/A;   TRICUSPID VALVE REPLACEMENT N/A 12/29/2020   Procedure: 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 RING SIZE 34;  Surgeon: [name], MD;  Location: [place];  Service: Open Heart Surgery;  Laterality: N/A;    Current Outpatient Medications  Medication Sig Dispense Refill   acetaminophen (TYLENOL) 325 MG tablet Take 2 tablets (650 mg total) by mouth every 6 (six) hours as  needed for mild pain or moderate pain.     albuterol (PROVENTIL HFA) 108 (90 Base) MCG/ACT inhaler Inhale 1-2 puffs into the lungs every 6 (six) hours as needed for wheezing or shortness of breath. 18 g 2   amiodarone (PACERONE) 200 MG tablet TAKE 1 TABLET BY MOUTH TWICE A DAY 180 tablet 3   amoxicillin (AMOXIL) 500 MG capsule Take 500 mg by mouth 3 (three) times daily.     apixaban (ELIQUIS) 5 MG TABS tablet TAKE ONE TABLET BY MOUTH TWICE DAILY at [place] (Patient taking differently: Take 5 mg by mouth in the morning  and at bedtime.) 60 tablet 1   benzonatate (TESSALON PERLES) 100 MG capsule Take 1 capsule (100 mg total) by mouth 3 (three) times daily as needed for cough. 20 capsule 0   Blood Glucose Monitoring Suppl (ACCU-CHEK GUIDE) w/Device KIT Use to check blood sugar TID. Dx: E11.69 1 kit 0   busPIRone (BUSPAR) 5 MG tablet TAKE ONE TABLET BY MOUTH TWICE DAILY @ 9AM & 5PM 180 tablet 11   colchicine 0.6 MG tablet TAKE 0.5 TABLETS BY MOUTH DAILY. 45 tablet 0   Dextromethorphan HBr (TUSSIN COUGH) 15 MG CAPS Take 1 capsule (15 mg total) by mouth 2 (two) times daily as needed. 28 capsule 0   diphenhydrAMINE (BENADRYL) 25 mg capsule Take 25 mg by mouth every 6 (six) hours as needed for allergies or itching.     Dulaglutide (TRULICITY) 1.5 0000000 SOPN Inject 1.5 mg into the skin once a week. 2 mL 2   DULoxetine (CYMBALTA) 60 MG capsule Take 1 capsule (60 mg total) by mouth daily. 180 capsule 1   EPINEPHrine 0.3 mg/0.3 mL IJ SOAJ injection Inject 0.3 mg into the muscle as needed for anaphylaxis. 1 each 1   fluticasone (FLONASE) 50 MCG/ACT nasal spray Place 2 sprays into both nostrils daily as needed for allergies or rhinitis. 16 g 3   glucose blood (ACCU-CHEK GUIDE) test strip USE TO CHECK BLOOD SUGAR 3 TIMES DAILY 100 strip 3   guaiFENesin 200 MG tablet Take 1 tablet (200 mg total) by mouth every 4 (four) hours as needed for cough or to loosen phlegm. 30 suppository 0   insulin lispro (HUMALOG  KWIKPEN) 100 UNIT/ML KwikPen Inject 10 Units into the skin 3 (three) times daily. 15 mL 3   KLOR-CON M20 20 MEQ tablet Take 20 mEq by mouth 2 (two) times daily.     lidocaine-prilocaine (EMLA) cream SMARTSIG:sparingly Topical 3 Times a Week     linaclotide (LINZESS) 72 MCG capsule TAKE 1 CAPSULE (72 MCG TOTAL) BY MOUTH DAILY BEFORE BREAKFAST. IF BM NOT ACHIEVE WITH 1 CAPSULE SHE MAY TAKE 2 DAILY. STOP IF DIARRHEA OCCURS 60 capsule 3   loratadine (CLARITIN) 10 MG tablet TAKE ONE TABLET BY MOUTH DAILY AT 9AM 30 tablet 11   metolazone (ZAROXOLYN) 2.5 MG tablet Take 2.5 mg by mouth 2 (two) times a week.     metoprolol succinate (TOPROL-XL) 25 MG 24 hr tablet TAKE 1 TABLET BY MOUTH IN THE MORNING AND AT BEDTIME 180 tablet 1   midodrine (PROAMATINE) 10 MG tablet Take by mouth.     Multiple Vitamins-Minerals (MULTIVITAMIN WOMEN PO) Take 1 tablet by mouth daily.     multivitamin (RENA-VIT) TABS tablet Take 1 tablet by mouth daily.     nitroGLYCERIN (NITROSTAT) 0.4 MG SL tablet Place 1 tablet (0.4 mg total) under the tongue every 5 (five) minutes x 3 doses as needed for chest pain. 25 tablet 12   ondansetron (ZOFRAN) 4 MG tablet TAKE 1 TABLET BY MOUTH EVERY 8 HOURS AS NEEDED FOR NAUSEA FOR VOMITING 30 tablet 0   pantoprazole (PROTONIX) 40 MG tablet TAKE ONE TABLET BY MOUTH DAILY AT 9AM 90 tablet 11   polyethylene glycol powder (GLYCOLAX/MIRALAX) 17 GM/SCOOP powder Take 17 g by mouth daily. (Patient taking differently: Take 17 g by mouth daily as needed for mild constipation.) 238 g 0   pregabalin (LYRICA) 100 MG capsule Take 1 capsule (100 mg total) by mouth 3 (three) times daily as needed (for pain). 90 capsule 2   RESTASIS  0.05 % ophthalmic emulsion Place 1 drop into both eyes in the morning and at bedtime.  12   rosuvastatin (CRESTOR) 10 MG tablet TAKE ONE TABLET BY MOUTH DAILY AT 5PM 90 tablet 0   senna-docusate (SENOKOT-S) 8.6-50 MG tablet Take 1 tablet by mouth 2 (two) times daily. 14 tablet 0    torsemide (DEMADEX) 100 MG tablet Take 1 tablet by mouth. Monday Wednesday and Friday     traMADol (ULTRAM) 50 MG tablet Take 50 mg by mouth every 6 (six) hours as needed.     VELPHORO 500 MG chewable tablet Chew 500 mg by mouth 3 (three) times daily.     isosorbide mononitrate (IMDUR) 30 MG 24 hr tablet Take 0.5 tablets (15 mg total) by mouth daily. 15 tablet 0   No current facility-administered medications for this encounter.    Allergies  Allergen Reactions   Bee Pollen Anaphylaxis and Other (See Comments)    UNCONFIRMED, but IS allergic to bee VENOM   Bee Venom Anaphylaxis   Sulfa Antibiotics Itching    Social History   Socioeconomic History   Marital status: Significant Other    Spouse name: Not on file   Number of children: 2   Years of education: Not on file   Highest education level: Some college, no degree  Occupational History   Not on file  Tobacco Use   Smoking status: Never    Passive exposure: Never   Smokeless tobacco: Never   Tobacco comments:    Never smoke 10/20/22  Vaping Use   Vaping Use: Never used  Substance and Sexual Activity   Alcohol use: No   Drug use: No   Sexual activity: Not Currently  Other Topics Concern   Not on file  Social History Narrative   Pt lives in [place] with spouse.  2 grown children.   Previously worked in [employer] at [employer].  Now on disability   Social Determinants of Health   Financial Resource Strain: Low Risk  (03/31/2022)   Overall Financial Resource Strain (CARDIA)    Difficulty of Paying Living Expenses: Not hard at all  Food Insecurity: No Food Insecurity (03/31/2022)   Hunger Vital Sign    Worried About Running Out of Food in the Last Year: Never true    Ran Out of Food in the Last Year: Never true  Transportation Needs: No Transportation Needs (03/31/2022)   PRAPARE - Hydrologist (Medical): No    Lack of Transportation (Non-Medical): No  Physical Activity: Sufficiently Active  (03/31/2022)   Exercise Vital Sign    Days of Exercise per Week: 4 days    Minutes of Exercise per Session: 40 min  Stress: Stress Concern Present (03/31/2022)   [place]    Feeling of Stress : Very much  Social Connections: Socially Isolated (03/31/2022)   Social Connection and Isolation Panel [NHANES]    Frequency of Communication with Friends and Family: More than three times a week    Frequency of Social Gatherings with Friends and Family: More than three times a week    Attends Religious Services: Never    Marine scientist or Organizations: No    Attends Archivist Meetings: Never    Marital Status: Widowed  Intimate Partner Violence: Not At Risk (03/31/2022)   Humiliation, Afraid, Rape, and Kick questionnaire    Fear of Current or Ex-Partner: No    Emotionally Abused: No  Physically Abused: No    Sexually Abused: No    Family History  Problem Relation Age of Onset   Breast cancer Mother    Cancer Mother    Hypertension Mother    Cancer Father    Hypertension Sister    Hypertension Brother    CAD Other     ROS- All systems are reviewed and negative except as per the HPI above  Physical Exam: Vitals:   10/20/22 0857  BP: 122/80  Pulse: 77  SpO2: 100%  Weight: 126.8 kg  Height: '5\' 7"'$  (1.702 m)   Wt Readings from Last 3 Encounters:  10/20/22 126.8 kg  09/22/22 127.6 kg  09/14/22 131.8 kg    Labs: Lab Results  Component Value Date   NA 136 09/14/2022   K 4.4 09/14/2022   CL 93 (L) 09/14/2022   CO2 32 09/14/2022   GLUCOSE 93 09/14/2022   BUN 33 (H) 09/14/2022   CREATININE 8.20 (H) 09/14/2022   CALCIUM 10.3 09/14/2022   PHOS 5.7 (H) 06/24/2022   MG 2.0 03/12/2022   Lab Results  Component Value Date   INR 2.0 (H) 06/28/2022   Lab Results  Component Value Date   CHOL 158 05/03/2021   HDL 50 05/03/2021   LDLCALC 91 05/03/2021   TRIG 83 05/03/2021     GEN- The  patient is well appearing, alert and oriented x 3 today. Sitting in wheelchair. Head- normocephalic, atraumatic Eyes-  Sclera clear, conjunctiva pink Ears- hearing intact Oropharynx- clear Neck- supple, no JVP Lymph- no cervical lymphadenopathy Lungs- Clear to ausculation bilaterally, normal work of breathing Heart- Regular rate and rhythm, no murmurs, rubs or gallops, PMI not laterally displaced Extremities- Fluid status difficult to ascertain secondary to body habitus. No gross clubbing, cyanosis, or edema MS- no significant deformity or atrophy Skin- no rash or lesion. Port noted right upper chest.  Psych- Pleasant but sad affect Neuro- strength and sensation are intact  EKG- NSR 77 bpm QT 440 ms Qtc 490-500 ms when measured manually   Assessment and Plan:  1. Persistent atrial fib/flutter  Has had afib for 10+ years  Has failed Tikosyn and amiodarone historically Hx Maze procedure and multiple cardioversions  Recent cmet reviewed, check TSH She is in NSR today. Qtc measured manually 490-500 ms. Will continue amiodarone without dosage change. Will plan for f/u with EP in 6 months.   2. Chronic systolic HF Overdue for f/u with Dr. Harrell Gallagher, will help arrange f/u.  3. ESRD - HD - Tu/Th/Sat schedule Defer fluid mgmt to Nephrology  4. Secondary hypercoagulable state CHADS2VASC score of 4 Continue Eliquis 5 mg BID   Follow up with Dr [name] and Dr [name] in 6 months.     [facility] [address] [place], [place] [address] [phone] 10/20/22 10:25 am

## 2022-10-22 ENCOUNTER — Other Ambulatory Visit (INDEPENDENT_AMBULATORY_CARE_PROVIDER_SITE_OTHER): Payer: Self-pay | Admitting: Primary Care

## 2022-10-22 DIAGNOSIS — K3184 Gastroparesis: Secondary | ICD-10-CM

## 2022-10-23 ENCOUNTER — Other Ambulatory Visit: Payer: Self-pay

## 2022-10-23 ENCOUNTER — Emergency Department (HOSPITAL_BASED_OUTPATIENT_CLINIC_OR_DEPARTMENT_OTHER)
Admission: EM | Admit: 2022-10-23 | Discharge: 2022-10-23 | Disposition: A | Discharge status: Home / Self Care | Payer: 59 | Attending: Emergency Medicine | Admitting: Emergency Medicine

## 2022-10-23 ENCOUNTER — Emergency Department (HOSPITAL_BASED_OUTPATIENT_CLINIC_OR_DEPARTMENT_OTHER): Payer: 59 | Admitting: Radiology

## 2022-10-23 ENCOUNTER — Encounter (HOSPITAL_BASED_OUTPATIENT_CLINIC_OR_DEPARTMENT_OTHER): Payer: Self-pay | Admitting: Emergency Medicine

## 2022-10-23 DIAGNOSIS — Z794 Long term (current) use of insulin: Secondary | ICD-10-CM | POA: Diagnosis not present

## 2022-10-23 DIAGNOSIS — R079 Chest pain, unspecified: Secondary | ICD-10-CM | POA: Diagnosis not present

## 2022-10-23 DIAGNOSIS — Z1152 Encounter for screening for COVID-19: Secondary | ICD-10-CM | POA: Diagnosis not present

## 2022-10-23 DIAGNOSIS — R0602 Shortness of breath: Secondary | ICD-10-CM | POA: Insufficient documentation

## 2022-10-23 DIAGNOSIS — R1013 Epigastric pain: Secondary | ICD-10-CM | POA: Diagnosis present

## 2022-10-23 DIAGNOSIS — N186 End stage renal disease: Secondary | ICD-10-CM | POA: Diagnosis not present

## 2022-10-23 DIAGNOSIS — Z992 Dependence on renal dialysis: Secondary | ICD-10-CM | POA: Insufficient documentation

## 2022-10-23 DIAGNOSIS — K3184 Gastroparesis: Secondary | ICD-10-CM | POA: Diagnosis not present

## 2022-10-23 DIAGNOSIS — Z7901 Long term (current) use of anticoagulants: Secondary | ICD-10-CM | POA: Insufficient documentation

## 2022-10-23 DIAGNOSIS — R2243 Localized swelling, mass and lump, lower limb, bilateral: Secondary | ICD-10-CM | POA: Diagnosis not present

## 2022-10-23 DIAGNOSIS — Z79899 Other long term (current) drug therapy: Secondary | ICD-10-CM | POA: Diagnosis not present

## 2022-10-23 DIAGNOSIS — I509 Heart failure, unspecified: Secondary | ICD-10-CM | POA: Diagnosis not present

## 2022-10-23 LAB — RESP PANEL BY RT-PCR (RSV, FLU A&B, COVID)  RVPGX2
Influenza A by PCR: NEGATIVE
Influenza B by PCR: NEGATIVE
Resp Syncytial Virus by PCR: NEGATIVE
SARS Coronavirus 2 by RT PCR: NEGATIVE

## 2022-10-23 LAB — CBC WITH DIFFERENTIAL/PLATELET
Abs Immature Granulocytes: 0.01 10*3/uL (ref 0.00–0.07)
Basophils Absolute: 0 10*3/uL (ref 0.0–0.1)
Basophils Relative: 0 %
Eosinophils Absolute: 0.1 10*3/uL (ref 0.0–0.5)
Eosinophils Relative: 2 %
HCT: 35.1 % — ABNORMAL LOW (ref 36.0–46.0)
Hemoglobin: 11.6 g/dL — ABNORMAL LOW (ref 12.0–15.0)
Immature Granulocytes: 0 %
Lymphocytes Relative: 19 %
Lymphs Abs: 1.3 10*3/uL (ref 0.7–4.0)
MCH: 30 pg (ref 26.0–34.0)
MCHC: 33 g/dL (ref 30.0–36.0)
MCV: 90.7 fL (ref 80.0–100.0)
Monocytes Absolute: 0.8 10*3/uL (ref 0.1–1.0)
Monocytes Relative: 12 %
Neutro Abs: 4.6 10*3/uL (ref 1.7–7.7)
Neutrophils Relative %: 67 %
Platelets: 182 10*3/uL (ref 150–400)
RBC: 3.87 MIL/uL (ref 3.87–5.11)
RDW: 14.7 % (ref 11.5–15.5)
WBC: 6.8 10*3/uL (ref 4.0–10.5)
nRBC: 0 % (ref 0.0–0.2)

## 2022-10-23 LAB — COMPREHENSIVE METABOLIC PANEL
ALT: 13 U/L (ref 0–44)
AST: 20 U/L (ref 15–41)
Albumin: 3.9 g/dL (ref 3.5–5.0)
Alkaline Phosphatase: 78 U/L (ref 38–126)
Anion gap: 14 (ref 5–15)
BUN: 26 mg/dL — ABNORMAL HIGH (ref 8–23)
CO2: 26 mmol/L (ref 22–32)
Calcium: 10.1 mg/dL (ref 8.9–10.3)
Chloride: 99 mmol/L (ref 98–111)
Creatinine, Ser: 7.3 mg/dL — ABNORMAL HIGH (ref 0.44–1.00)
GFR, Estimated: 6 mL/min — ABNORMAL LOW (ref >60)
Glucose, Bld: 72 mg/dL (ref 70–99)
Potassium: 3.8 mmol/L (ref 3.5–5.1)
Sodium: 139 mmol/L (ref 135–145)
Total Bilirubin: 0.8 mg/dL (ref 0.3–1.2)
Total Protein: 8.5 g/dL — ABNORMAL HIGH (ref 6.5–8.1)

## 2022-10-23 LAB — URINALYSIS, ROUTINE W REFLEX MICROSCOPIC
Bilirubin Urine: NEGATIVE
Glucose, UA: NEGATIVE mg/dL
Ketones, ur: NEGATIVE mg/dL
Leukocytes,Ua: NEGATIVE
Nitrite: NEGATIVE
Protein, ur: 100 mg/dL — AB
Specific Gravity, Urine: 1.014 (ref 1.005–1.030)
pH: 7 (ref 5.0–8.0)

## 2022-10-23 LAB — TROPONIN I (HIGH SENSITIVITY)
Troponin I (High Sensitivity): 6 ng/L (ref <18)
Troponin I (High Sensitivity): 6 ng/L (ref <18)

## 2022-10-23 LAB — BRAIN NATRIURETIC PEPTIDE: B Natriuretic Peptide: 362.2 pg/mL — ABNORMAL HIGH (ref 0.0–100.0)

## 2022-10-23 LAB — LIPASE, BLOOD: Lipase: <10 U/L — ABNORMAL LOW (ref 11–51)

## 2022-10-23 MED ORDER — ONDANSETRON HCL 4 MG/2ML IJ SOLN
4.0000 mg | Freq: Once | INTRAMUSCULAR | Status: DC
  Filled 2022-10-23: qty 2

## 2022-10-23 MED ORDER — DROPERIDOL 2.5 MG/ML IJ SOLN
2.5000 mg | Freq: Once | INTRAMUSCULAR | Status: AC
  Administered 2022-10-23: 2.5 mg via INTRAVENOUS
  Filled 2022-10-23: qty 2

## 2022-10-23 NOTE — ED Triage Notes (Signed)
Pt arrives to ED with c/o chest pain and abdominal pains x1 months. Pt notes vomiting and unable to tolerate food/fluids. Hx gastroparesis and ESRD.

## 2022-10-23 NOTE — Discharge Instructions (Signed)
Please follow-up with the GI specialist I have attached for you for your gastroparesis and recent ER visit.  Please make a phone call after your discharge to be seen in the next few days.  Please read the attached denies fever about gastroparesis and until you are able to see the GI specialist please take in small amounts of food and not whole meals.  Please take in plain fluids and stay hydrated.  If symptoms worsen please return to ER.

## 2022-10-23 NOTE — ED Provider Notes (Signed)
[facility] Provider Note   CSN: [id number] Arrival date & time: 10/23/22  1151     History  Chief Complaint  Patient presents with   Abdominal Pain   Chest Pain    [name] is a 67 y.o. female history of congestive heart failure and end-stage renal disease on dialysis Tuesday/Thursday/Saturday presented with 1 week of chills, abdominal pain, chest pain, nonbloody emesis.  Patient states the symptoms are constant and that the abdominal pain is in the epigastric area radiating up to her chest.  Patient states pain is present after she eats food and that she becomes nauseous and will vomit the food. patient's last dialysis treatment was on Saturday and she was able to complete it fully.  Patient states she feels short of breath.  Patient does not know what the triggers are for her chest/abdominal pain or shortness of breath.  Patient states the symptoms are very similar to her when she was seen a month ago.  Patient denied leg swelling, fevers, sick contacts, loss consciousness, headaches, vision changes, changes in sensation/motor skills, hematemesis, hematochezia, dysuria  Home Medications Prior to Admission medications   Medication Sig Start Date End Date Taking? Authorizing Provider  acetaminophen (TYLENOL) 325 MG tablet Take 2 tablets (650 mg total) by mouth every 6 (six) hours as needed for mild pain or moderate pain. 12/07/21   Arrien, Jimmy Picket, MD  albuterol (PROVENTIL HFA) 108 (90 Base) MCG/ACT inhaler Inhale 1-2 puffs into the lungs every 6 (six) hours as needed for wheezing or shortness of breath. 10/13/22   [name], NP  amiodarone (PACERONE) 200 MG tablet TAKE 1 TABLET BY MOUTH TWICE A DAY 04/25/22   Bensimhon, Shaune Pascal, MD  amoxicillin (AMOXIL) 500 MG capsule Take 500 mg by mouth 3 (three) times daily. 09/06/22   [provider]  apixaban (ELIQUIS) 5 MG TABS tablet TAKE ONE TABLET BY MOUTH TWICE  DAILY at [place] Patient taking differently: Take 5 mg by mouth in the morning and at bedtime. 02/24/22   Shelly Coss, MD  benzonatate (TESSALON PERLES) 100 MG capsule Take 1 capsule (100 mg total) by mouth 3 (three) times daily as needed for cough. 05/23/22   [name], NP  Blood Glucose Monitoring Suppl (ACCU-CHEK GUIDE) w/Device KIT Use to check blood sugar TID. Dx: E11.69 06/28/22   Charlott Rakes, MD  busPIRone (BUSPAR) 5 MG tablet TAKE ONE TABLET BY MOUTH TWICE DAILY @ 9AM & 5PM 09/06/22   [name], NP  colchicine 0.6 MG tablet TAKE 0.5 TABLETS BY MOUTH DAILY. 07/28/22   Charlott Rakes, MD  Dextromethorphan HBr (TUSSIN COUGH) 15 MG CAPS Take 1 capsule (15 mg total) by mouth 2 (two) times daily as needed. 09/28/22   [name], NP  diphenhydrAMINE (BENADRYL) 25 mg capsule Take 25 mg by mouth every 6 (six) hours as needed for allergies or itching.    [provider]  Dulaglutide (TRULICITY) 1.5 0000000 SOPN Inject 1.5 mg into the skin once a week. 07/05/22   [name], NP  DULoxetine (CYMBALTA) 60 MG capsule Take 1 capsule (60 mg total) by mouth daily. 07/05/22   [name], NP  EPINEPHrine 0.3 mg/0.3 mL IJ SOAJ injection Inject 0.3 mg into the muscle as needed for anaphylaxis. 06/25/20   [name], NP  fluticasone (FLONASE) 50 MCG/ACT nasal spray Place 2 sprays into both nostrils daily as needed for allergies or rhinitis.  09/22/22   [name], NP  glucose blood (ACCU-CHEK GUIDE) test strip USE TO CHECK BLOOD SUGAR 3 TIMES DAILY 04/03/22   Charlott Rakes, MD  guaiFENesin 200 MG tablet Take 1 tablet (200 mg total) by mouth every 4 (four) hours as needed for cough or to loosen phlegm. 07/05/22   [name], NP  insulin lispro (HUMALOG KWIKPEN) 100 UNIT/ML KwikPen Inject 10 Units into the skin 3 (three) times daily. 07/05/22   [name], NP  isosorbide mononitrate (IMDUR) 30 MG 24 hr tablet Take 0.5 tablets  (15 mg total) by mouth daily. 03/18/22 06/20/22  Arrien, Jimmy Picket, MD  KLOR-CON M20 20 MEQ tablet Take 20 mEq by mouth 2 (two) times daily. 04/18/22   [provider]  lidocaine-prilocaine (EMLA) cream SMARTSIG:sparingly Topical 3 Times a Week 06/01/22   [provider]  linaclotide (LINZESS) 72 MCG capsule TAKE 1 CAPSULE (72 MCG TOTAL) BY MOUTH DAILY BEFORE BREAKFAST. IF BM NOT ACHIEVE WITH 1 CAPSULE SHE MAY TAKE 2 DAILY. STOP IF DIARRHEA OCCURS 07/05/22   [name], NP  loratadine (CLARITIN) 10 MG tablet TAKE ONE TABLET BY MOUTH DAILY AT 9AM 09/06/22   [name], NP  metolazone (ZAROXOLYN) 2.5 MG tablet Take 2.5 mg by mouth 2 (two) times a week. 04/20/22   [provider]  metoprolol succinate (TOPROL-XL) 25 MG 24 hr tablet TAKE 1 TABLET BY MOUTH IN THE MORNING AND AT BEDTIME 09/12/22   Vickie Epley, MD  midodrine (PROAMATINE) 10 MG tablet Take by mouth. 08/16/22   [provider]  Multiple Vitamins-Minerals (MULTIVITAMIN WOMEN PO) Take 1 tablet by mouth daily.    [provider]  multivitamin (RENA-VIT) TABS tablet Take 1 tablet by mouth daily. 04/27/22   [provider]  nitroGLYCERIN (NITROSTAT) 0.4 MG SL tablet Place 1 tablet (0.4 mg total) under the tongue every 5 (five) minutes x 3 doses as needed for chest pain. 01/08/14   Charolette Forward, MD  ondansetron (ZOFRAN) 4 MG tablet TAKE 1 TABLET BY MOUTH EVERY 8 HOURS AS NEEDED FOR NAUSEA FOR VOMITING 09/28/22   [name], NP  pantoprazole (PROTONIX) 40 MG tablet TAKE ONE TABLET BY MOUTH DAILY AT 9AM 04/17/22   [name], NP  polyethylene glycol powder (GLYCOLAX/MIRALAX) 17 GM/SCOOP powder Take 17 g by mouth daily. Patient taking differently: Take 17 g by mouth daily as needed for mild constipation. 02/25/22   Shelly Coss, MD  pregabalin (LYRICA) 100 MG capsule Take 1 capsule (100 mg total) by mouth 3 (three) times daily as needed (for pain). 07/05/22    [name], NP  RESTASIS 0.05 % ophthalmic emulsion Place 1 drop into both eyes in the morning and at bedtime. 11/19/17   [provider]  rosuvastatin (CRESTOR) 10 MG tablet TAKE ONE TABLET BY MOUTH DAILY AT 5PM 08/16/22   Joette Catching, PA-C  senna-docusate (SENOKOT-S) 8.6-50 MG tablet Take 1 tablet by mouth 2 (two) times daily. 02/24/22   Shelly Coss, MD  torsemide (DEMADEX) 100 MG tablet Take 1 tablet by mouth. Monday Wednesday and Friday 04/02/22   [provider]  traMADol (ULTRAM) 50 MG tablet Take 50 mg by mouth every 6 (six) hours as needed. 09/06/22   [provider]  VELPHORO 500 MG chewable tablet Chew 500 mg by mouth 3 (three) times daily. 09/15/22   [provider]      Allergies    Bee pollen, Bee venom, and Sulfa  antibiotics    Review of Systems   Review of Systems  Cardiovascular:  Positive for chest pain.  Gastrointestinal:  Positive for abdominal pain.  See HPI  Physical Exam Updated Vital Signs BP (!) 122/93   Pulse 85   Temp 98.1 F (36.7 C) (Oral)   Resp 18   SpO2 98%  Physical Exam Vitals and nursing note reviewed.  Constitutional:      General: She is not in acute distress.    Appearance: She is well-developed.  HENT:     Head: Normocephalic and atraumatic.  Eyes:     Extraocular Movements: Extraocular movements intact.     Conjunctiva/sclera: Conjunctivae normal.     Pupils: Pupils are equal, round, and reactive to light.  Cardiovascular:     Rate and Rhythm: Normal rate and regular rhythm.     Pulses: Normal pulses.     Heart sounds: Normal heart sounds. No murmur heard.    Comments: 2+ bilateral radial/posterior tibialis/dorsalis pedis pulses with regular rate Pulmonary:     Effort: Pulmonary effort is normal. No respiratory distress.     Breath sounds: Normal breath sounds.  Abdominal:     General: There is no distension.     Palpations: Abdomen is soft. There is no mass.     Tenderness:  There is abdominal tenderness in the epigastric area. There is no guarding or rebound.  Musculoskeletal:        General: Normal range of motion.     Cervical back: Normal range of motion and neck supple.     Right lower leg: Edema present.     Left lower leg: Edema present.     Comments: 5 out of 5 bilateral grip strength/dorsiflexion/plantarflexion  Skin:    General: Skin is warm and dry.     Capillary Refill: Capillary refill takes less than 2 seconds.  Neurological:     General: No focal deficit present.     Mental Status: She is alert and oriented to person, place, and time.     Comments: Sensation intact in all 4 limbs  Psychiatric:        Mood and Affect: Mood normal.     ED Results / Procedures / Treatments   Labs (all labs ordered are listed, but only abnormal results are displayed) Labs Reviewed  CBC WITH DIFFERENTIAL/PLATELET - Abnormal; Notable for the following components:      Result Value   Hemoglobin 11.6 (*)    HCT 35.1 (*)    All other components within normal limits  COMPREHENSIVE METABOLIC PANEL - Abnormal; Notable for the following components:   BUN 26 (*)    Creatinine, Ser 7.30 (*)    Total Protein 8.5 (*)    GFR, Estimated 6 (*)    All other components within normal limits  LIPASE, BLOOD - Abnormal; Notable for the following components:   Lipase <10 (*)    All other components within normal limits  URINALYSIS, ROUTINE W REFLEX MICROSCOPIC - Abnormal; Notable for the following components:   APPearance HAZY (*)    Hgb urine dipstick SMALL (*)    Protein, ur 100 (*)    Bacteria, UA FEW (*)    All other components within normal limits  BRAIN NATRIURETIC PEPTIDE - Abnormal; Notable for the following components:   B Natriuretic Peptide 362.2 (*)    All other components within normal limits  RESP PANEL BY RT-PCR (RSV, FLU A&B, COVID)  RVPGX2  TROPONIN I (HIGH SENSITIVITY)  TROPONIN  I (HIGH SENSITIVITY)    EKG EKG Interpretation  Date/Time:  Monday  October 23 2022 12:12:02 EST Ventricular Rate:  87 PR Interval:  212 QRS Duration: 102 QT Interval:  444 QTC Calculation: 534 R Axis:   110 Text Interpretation: Sinus rhythm with 1st degree A-V block Left posterior fascicular block Inferior infarct (cited on or before 20-Oct-2022) ST & T wave abnormality, consider anterior ischemia Prolonged QT Abnormal ECG When compared with ECG of 20-Oct-2022 08:57, Sinus rhythm has replaced Junctional rhythm Borderline criteria for Anterior infarct are no longer Present T wave inversion now evident in Lateral leads Confirmed by Regan Lemming ([id number]) on 10/23/2022 12:15:02 PM  Radiology DG Chest 2 View  Result Date: 10/23/2022 CLINICAL DATA:  68 year old female with history of chest pain for 1 week. Shortness of breath. EXAM: CHEST - 2 VIEW COMPARISON:  Chest x-ray 09/14/2022. FINDINGS: Right internal jugular PermCath with tip terminating in the right atrium. Lung volumes are normal. No consolidative airspace disease. No pleural effusions. No pneumothorax. No evidence of pulmonary edema. Heart size is mildly enlarged (unchanged). Upper mediastinal contours are within normal limits. Atherosclerotic calcifications in the thoracic aorta. Status post median sternotomy. Left atrial appendage ligation clip incidentally noted. IMPRESSION: 1. Postoperative changes and support apparatus, as above. 2. No radiographic evidence of acute cardiopulmonary disease. 3. Mild cardiomegaly (unchanged). 4. Aortic atherosclerosis. Electronically Signed   By: Vinnie Langton M.D.   On: 10/23/2022 12:38    Procedures Procedures    Medications Ordered in ED Medications  droperidol (INAPSINE) 2.5 MG/ML injection 2.5 mg (2.5 mg Intravenous Given 10/23/22 1459)    ED Course/ Medical Decision Making/ A&P                             Medical Decision Making Amount and/or Complexity of Data Reviewed Labs: ordered. Radiology: ordered.   Spero Geralds 67 y.o. presented today for  chest pain, abdominal pain, nausea/vomiting. Working DDx that I considered at this time includes, but not limited to, gastroparesis, pancreatitis, ACS, gastritis, URI, myocarditis anemia, electrolyte abnormalities, pleural effusions, CHF exacerbation.  Review of prior external notes: 09/14/2022 ED  Unique Tests and My Interpretation:  Troponin: 6, 6 Respiratory panel: Negative Lipase: Unremarkable CMP: Similar to previous results UA: Unremarkable BMP: 362.2 CBC: Unremarkable Chest x-ray: No acute cardiopulmonary changes EKG: First-degree heart block, no new signs of ST elevation  Discussion with Independent Historian: Husband  Discussion of Management of Tests: None  Risk: Low:  - based on diagnostic testing/clinical impression and treatment plan  Risk Stratification Score: None  Staffed with Regan Lemming, MD  R/o DDx: Pancreatitis: Unremarkable lipase ACS: Negative serial troponins and negative EKG Pleural effusions: Chest x-ray negative CHF exacerbation: Chest x-ray negative and with patient's history this does not correlate with her abdominal pain after she eats  Plan: Patient presented for chest pain, abdominal pain, nausea/vomiting.  On exam is stable vitals and does not appear to be in any distress however she did endorse pain in her epigastric region.  Patient did appear fluid overloaded on exam however chest x-ray did not show any pleural effusions.  Patient was given droperidol for her nausea and reevaluated.  Patient symptoms are similar to when she was seen a month ago.  At this time I believe the patient's symptoms are related to her gastroparesis and she will need to see a GI doctor to further evaluate.  Patient stable at this time.  Patient  after receiving droperidol stated the nausea had ceased.  Patient was p.o. challenged and patient was able to successfully tolerate fluids.  Patient will be discharged at this time.  I spoke with the patient about taking small  months of fluids and the importance of following up with a GI specialist.  Patient was given return precautions.patient stable for discharge at this time.  Patient verbalized understanding of plan.         Final Clinical Impression(s) / ED Diagnoses Final diagnoses:  Gastroparesis    Rx / DC Orders ED Discharge Orders     None         Elvina Sidle 10/23/22 1604    Regan Lemming, MD 10/23/22 2015

## 2022-10-23 NOTE — ED Notes (Signed)
Patient verbalizes understanding of discharge instructions. Opportunity for questioning and answers were provided. Patient discharged from ED.  °

## 2022-10-25 ENCOUNTER — Telehealth (HOSPITAL_BASED_OUTPATIENT_CLINIC_OR_DEPARTMENT_OTHER): Payer: Self-pay

## 2022-10-25 NOTE — Telephone Encounter (Signed)
   Pre-operative Risk Assessment    Patient Name: [name]  DOB: 1956-04-18 MRN: [id number]      Request for Surgical Clearance    Procedure:   EGD  Date of Surgery:  Clearance 01/12/23                                 Surgeon:  Carol Ada, MD Surgeon's Group or Practice Name:  [facility] Phone number:  [phone] Fax number:  [phone]   Type of Clearance Requested:   - Medical  - Pharmacy:  Hold Apixaban (Eliquis) how long?   Type of Anesthesia:   Propofol   Additional requests/questions:   None  Signed, Francella Solian   10/25/2022, 4:25 PM

## 2022-10-26 ENCOUNTER — Ambulatory Visit (HOSPITAL_COMMUNITY)
Admission: RE | Admit: 2022-10-26 | Discharge: 2022-10-26 | Disposition: A | Discharge status: Home / Self Care | Payer: 59 | Source: Ambulatory Visit | Attending: Vascular Surgery | Admitting: Vascular Surgery

## 2022-10-26 ENCOUNTER — Ambulatory Visit (INDEPENDENT_AMBULATORY_CARE_PROVIDER_SITE_OTHER): Payer: 59 | Admitting: Vascular Surgery

## 2022-10-26 ENCOUNTER — Encounter: Payer: Self-pay | Admitting: Vascular Surgery

## 2022-10-26 ENCOUNTER — Ambulatory Visit (INDEPENDENT_AMBULATORY_CARE_PROVIDER_SITE_OTHER)
Admission: RE | Admit: 2022-10-26 | Discharge: 2022-10-26 | Disposition: A | Discharge status: Home / Self Care | Payer: 59 | Source: Ambulatory Visit | Attending: Vascular Surgery | Admitting: Vascular Surgery

## 2022-10-26 VITALS — BP 126/81 | HR 72 | Temp 98.0°F | Resp 20 | Ht 67.0 in | Wt 274.0 lb

## 2022-10-26 DIAGNOSIS — Z992 Dependence on renal dialysis: Secondary | ICD-10-CM | POA: Insufficient documentation

## 2022-10-26 DIAGNOSIS — N186 End stage renal disease: Secondary | ICD-10-CM

## 2022-10-26 NOTE — Telephone Encounter (Signed)
Spoke with patient who is agreeable to see Dr. Harrell Gave on 5/10 at 9:20am. Patient thanked me for the call.

## 2022-10-26 NOTE — Telephone Encounter (Signed)
Primary Cardiologist:Bridgette Harrell Gave, MD  Chart reviewed as part of pre-operative protocol coverage. Because of [name]'s past medical history and time since last visit, he/she will require a follow-up visit in order to better assess preoperative cardiovascular risk.  Pre-op covering staff: - Please schedule appointment and call patient to inform them. - Please contact requesting surgeon's office via preferred method (i.e, phone, fax) to inform them of need for appointment prior to surgery.  Per office protocol, patient can hold Eliquis for 2 days prior to procedure.      Emmaline Life, NP-C  10/26/2022, 9:29 AM [address], [address] Office [phone] Fax [phone]

## 2022-10-26 NOTE — Telephone Encounter (Signed)
Patient with diagnosis of A fib on Eliquis for anticoagulation.    Procedure: EGD Date of procedure: 01/12/23   CHA2DS2-VASc Score = 6  This indicates a 9.7% annual risk of stroke. The patient's score is based upon: CHF History: 1 HTN History: 1 Diabetes History: 1 Stroke History: 0 Vascular Disease History: 1 Age Score: 1 Gender Score: 1   CrCl 15 mL/min Platelet count 182K   Per office protocol, patient can hold Eliquis for 2 days prior to procedure.    **This guidance is not considered finalized until pre-operative APP has relayed final recommendations.**

## 2022-10-26 NOTE — Progress Notes (Signed)
REASON FOR VISIT:   To evaluate for an AV graft.  The consult is requested by Dr. Corliss Parish.  MEDICAL ISSUES:   END-STAGE RENAL DISEASE: This patient was referred for evaluation for placement of a graft as it was felt that the fistula was occluded.  However by duplex the fistula is patent with diameters ranging from 5.2-5.7 mm.  It is tunneled superficially.  The issue with the fistula is a tight stenosis at the proximal anastomosis.  I think by addressing this the fistula could be salvaged and should continue to mature once we address the inflow problem.  She dialyzes on Tuesdays Thursdays and Saturdays and have arranged this for Friday, 11/10/2022.  We have discussed the indications for the procedure the potential complications and she is agreeable to proceed.  She is on Eliquis and we will stop this 48 hours prior to her procedure.  HPI:   [name] is a pleasant 67 y.o. female who was referred for evaluation of an AV graft.  Reportedly her fistula in the left was occluded.  On 03/17/2022 the patient had a left brachiocephalic fistula.  The vein was very deep and therefore I fulling dissected free the vein and transposed it at this initial setting.  It looks like she was never seen in follow-up.  The patient dialyzes on Tuesdays Thursdays and Saturdays.  She is on Eliquis for A-fib.  She has no specific complaints today.  Past Medical History:  Diagnosis Date   Acute on chronic systolic CHF (congestive heart failure) (HCC) 12/21/2020   Allergic rhinitis    Arthritis    Asthma    Chronic diastolic CHF (congestive heart failure) (HCC)    COPD (chronic obstructive pulmonary disease) (HCC)    Depression    DM (diabetes mellitus) ([place])    DVT (deep vein thrombosis) in pregnancy    GERD (gastroesophageal reflux disease)    HTN (hypertension)    Hyperlipidemia    Obesity    Pneumonia 04/2018   RIGHT LOBE   Sleep apnea    compliant with CPAP    Family  History  Problem Relation Age of Onset   Breast cancer Mother    Cancer Mother    Hypertension Mother    Cancer Father    Hypertension Sister    Hypertension Brother    CAD Other     SOCIAL HISTORY: Social History   Tobacco Use   Smoking status: Never    Passive exposure: Never   Smokeless tobacco: Never   Tobacco comments:    Never smoke 10/20/22  Substance Use Topics   Alcohol use: No    Allergies  Allergen Reactions   Bee Pollen Anaphylaxis and Other (See Comments)    UNCONFIRMED, but IS allergic to bee VENOM   Bee Venom Anaphylaxis   Sulfa Antibiotics Itching    Current Outpatient Medications  Medication Sig Dispense Refill   acetaminophen (TYLENOL) 325 MG tablet Take 2 tablets (650 mg total) by mouth every 6 (six) hours as needed for mild pain or moderate pain.     albuterol (PROVENTIL HFA) 108 (90 Base) MCG/ACT inhaler Inhale 1-2 puffs into the lungs every 6 (six) hours as needed for wheezing or shortness of breath. 18 g 2   amiodarone (PACERONE) 200 MG tablet TAKE 1 TABLET BY MOUTH TWICE A DAY 180 tablet 3   amoxicillin (AMOXIL) 500 MG capsule Take 500 mg by mouth 3 (three) times daily.     apixaban (ELIQUIS)  5 MG TABS tablet TAKE ONE TABLET BY MOUTH TWICE DAILY at [place] (Patient taking differently: Take 5 mg by mouth in the morning and at bedtime.) 60 tablet 1   benzonatate (TESSALON PERLES) 100 MG capsule Take 1 capsule (100 mg total) by mouth 3 (three) times daily as needed for cough. 20 capsule 0   Blood Glucose Monitoring Suppl (ACCU-CHEK GUIDE) w/Device KIT Use to check blood sugar TID. Dx: E11.69 1 kit 0   busPIRone (BUSPAR) 5 MG tablet TAKE ONE TABLET BY MOUTH TWICE DAILY @ 9AM & 5PM 180 tablet 11   colchicine 0.6 MG tablet TAKE 0.5 TABLETS BY MOUTH DAILY. 45 tablet 0   Dextromethorphan HBr (TUSSIN COUGH) 15 MG CAPS Take 1 capsule (15 mg total) by mouth 2 (two) times daily as needed. 28 capsule 0   diphenhydrAMINE (BENADRYL) 25 mg capsule Take 25 mg by  mouth every 6 (six) hours as needed for allergies or itching.     Dulaglutide (TRULICITY) 1.5 0000000 SOPN Inject 1.5 mg into the skin once a week. 2 mL 2   DULoxetine (CYMBALTA) 60 MG capsule Take 1 capsule (60 mg total) by mouth daily. 180 capsule 1   EPINEPHrine 0.3 mg/0.3 mL IJ SOAJ injection Inject 0.3 mg into the muscle as needed for anaphylaxis. 1 each 1   fluticasone (FLONASE) 50 MCG/ACT nasal spray Place 2 sprays into both nostrils daily as needed for allergies or rhinitis. 16 g 3   glucose blood (ACCU-CHEK GUIDE) test strip USE TO CHECK BLOOD SUGAR 3 TIMES DAILY 100 strip 3   guaiFENesin 200 MG tablet Take 1 tablet (200 mg total) by mouth every 4 (four) hours as needed for cough or to loosen phlegm. 30 suppository 0   insulin lispro (HUMALOG KWIKPEN) 100 UNIT/ML KwikPen Inject 10 Units into the skin 3 (three) times daily. 15 mL 3   KLOR-CON M20 20 MEQ tablet Take 20 mEq by mouth 2 (two) times daily.     lidocaine-prilocaine (EMLA) cream SMARTSIG:sparingly Topical 3 Times a Week     linaclotide (LINZESS) 72 MCG capsule TAKE 1 CAPSULE (72 MCG TOTAL) BY MOUTH DAILY BEFORE BREAKFAST. IF BM NOT ACHIEVE WITH 1 CAPSULE SHE MAY TAKE 2 DAILY. STOP IF DIARRHEA OCCURS 60 capsule 3   loratadine (CLARITIN) 10 MG tablet TAKE ONE TABLET BY MOUTH DAILY AT 9AM 30 tablet 11   metolazone (ZAROXOLYN) 2.5 MG tablet Take 2.5 mg by mouth 2 (two) times a week.     metoprolol succinate (TOPROL-XL) 25 MG 24 hr tablet TAKE 1 TABLET BY MOUTH IN THE MORNING AND AT BEDTIME 180 tablet 1   midodrine (PROAMATINE) 10 MG tablet Take by mouth.     Multiple Vitamins-Minerals (MULTIVITAMIN WOMEN PO) Take 1 tablet by mouth daily.     multivitamin (RENA-VIT) TABS tablet Take 1 tablet by mouth daily.     nitroGLYCERIN (NITROSTAT) 0.4 MG SL tablet Place 1 tablet (0.4 mg total) under the tongue every 5 (five) minutes x 3 doses as needed for chest pain. 25 tablet 12   ondansetron (ZOFRAN) 4 MG tablet TAKE 1 TABLET BY MOUTH EVERY 8  HOURS AS NEEDED FOR NAUSEA FOR VOMITING 30 tablet 0   pantoprazole (PROTONIX) 40 MG tablet TAKE ONE TABLET BY MOUTH DAILY AT 9AM 90 tablet 11   polyethylene glycol powder (GLYCOLAX/MIRALAX) 17 GM/SCOOP powder Take 17 g by mouth daily. (Patient taking differently: Take 17 g by mouth daily as needed for mild constipation.) 238 g 0  pregabalin (LYRICA) 100 MG capsule Take 1 capsule (100 mg total) by mouth 3 (three) times daily as needed (for pain). 90 capsule 2   RESTASIS 0.05 % ophthalmic emulsion Place 1 drop into both eyes in the morning and at bedtime.  12   rosuvastatin (CRESTOR) 10 MG tablet TAKE ONE TABLET BY MOUTH DAILY AT 5PM 90 tablet 0   senna-docusate (SENOKOT-S) 8.6-50 MG tablet Take 1 tablet by mouth 2 (two) times daily. 14 tablet 0   torsemide (DEMADEX) 100 MG tablet Take 1 tablet by mouth. Monday Wednesday and Friday     traMADol (ULTRAM) 50 MG tablet Take 50 mg by mouth every 6 (six) hours as needed.     VELPHORO 500 MG chewable tablet Chew 500 mg by mouth 3 (three) times daily.     isosorbide mononitrate (IMDUR) 30 MG 24 hr tablet Take 0.5 tablets (15 mg total) by mouth daily. 15 tablet 0   No current facility-administered medications for this visit.    REVIEW OF SYSTEMS:  '[X]'$  denotes positive finding, '[ ]'$  denotes negative finding Cardiac  Comments:  Chest pain or chest pressure:    Shortness of breath upon exertion:    Short of breath when lying flat:    Irregular heart rhythm:        Vascular    Pain in calf, thigh, or hip brought on by ambulation:    Pain in feet at night that wakes you up from your sleep:     Blood clot in your veins:    Leg swelling:         Pulmonary    Oxygen at home:    Productive cough:     Wheezing:         Neurologic    Sudden weakness in arms or legs:     Sudden numbness in arms or legs:     Sudden onset of difficulty speaking or slurred speech:    Temporary loss of vision in one eye:     Problems with dizziness:          Gastrointestinal    Blood in stool:     Vomited blood:         Genitourinary    Burning when urinating:     Blood in urine:        Psychiatric    Major depression:         Hematologic    Bleeding problems:    Problems with blood clotting too easily:        Skin    Rashes or ulcers:        Constitutional    Fever or chills:     PHYSICAL EXAM:   Vitals:   10/26/22 1506  BP: 126/81  Pulse: 72  Resp: 20  Temp: 98 F (36.7 C)  SpO2: 98%  Weight: 274 lb (124.3 kg)  Height: '5\' 7"'$  (1.702 m)    GENERAL: The patient is a well-nourished female, in no acute distress. The vital signs are documented above. CARDIAC: There is a regular rate and rhythm.  VASCULAR: She does have a palpable but weak thrill in her left upper arm fistula.  I did look myself with the SonoSite and the vein does have flow and is reasonable in size although slightly small.  She has a palpable radial pulse although slightly diminished. PULMONARY: There is good air exchange bilaterally without wheezing or rales. ABDOMEN: Soft and non-tender with normal pitched bowel sounds.  MUSCULOSKELETAL: There are  no major deformities or cyanosis. NEUROLOGIC: No focal weakness or paresthesias are detected. SKIN: There are no ulcers or rashes noted. PSYCHIATRIC: The patient has a normal affect.  DATA:    ARTERIAL DUPLEX: I have independently interpreted her arterial duplex scan today.  On the right side the brachial artery measures 3.3 mm in diameter.  There is a triphasic radial and ulnar waveform with the Doppler.  On the left side the brachial artery measures 3.7 mm in diameter.  There is a biphasic radial signal and a triphasic ulnar signal on the left.  UPPER EXTREMITY VEIN MAP: I have independently interpreted her upper extremity vein map.  On the right side the forearm and upper arm cephalic vein do not appear adequate in size.  Diameters of the vein ranged from 1-2 mm.  The basilic vein could not be  identified on the right.  On the left side there was a patent left brachiocephalic fistula noted with a stenosis at the anastomosis.  The outflow veins were widely patent.  Volume flow was 229 cc/min.  There were markedly elevated velocities at the proximal anastomosis.  The diameter of the vein ranged from 5.2-5.7 mm.   [name] [facility] [phone]

## 2022-10-27 ENCOUNTER — Telehealth: Payer: Self-pay

## 2022-10-27 NOTE — Telephone Encounter (Signed)
Attempted to reach pt to schedule surgery. I have left her a voicemail asking her to return our call.

## 2022-10-28 IMAGING — CT CT ANGIO HEAD-NECK (W OR W/O PERF)
2 of 7 series · 8 of 33 positions shown · IV contrast (OMNI 350)
Comparison: None.

CLINICAL DATA: Neuro deficit, acute, stroke suspected

EXAM:
CT ANGIOGRAPHY HEAD AND NECK
TECHNIQUE: Multidetector CT imaging of the head and neck was performed using
the standard protocol during bolus administration of intravenous
contrast. Multiplanar CT image reconstructions and MIPs were
obtained to evaluate the vascular anatomy. Carotid stenosis
measurements (when applicable) are obtained utilizing NASCET
criteria, using the distal internal carotid diameter as the
denominator.

[Series 5: cta neck · axial · 0.42mm/px · z∈[+1110,+1218]mm · 2 of 164 slices shown]
[im 55/164  soft-tissue]
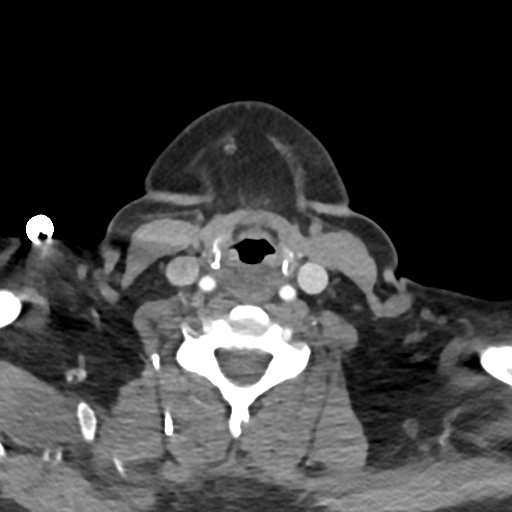
[im 109/164  soft-tissue]
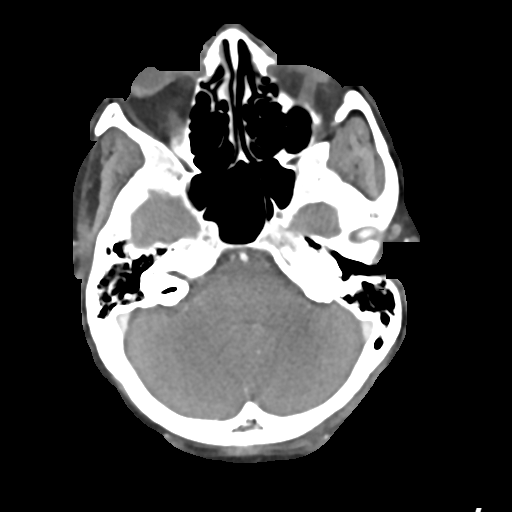

[Series 7: cta neck axial · axial · 0.39mm/px · z∈[+1049,+1282]mm · 6 of 327 slices shown]
[im 47/327  soft-tissue]
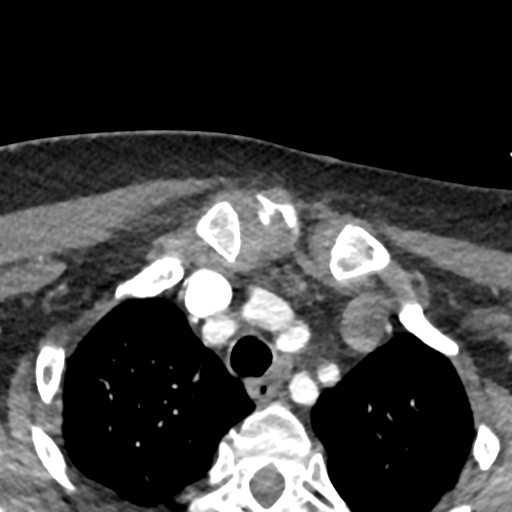
[im 94/327  bone]
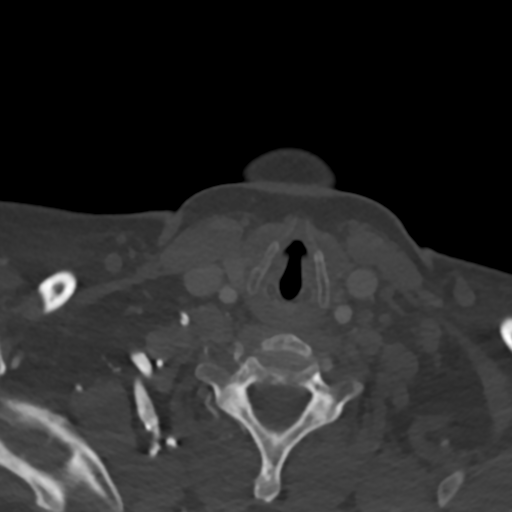
[im 140/327  soft-tissue]
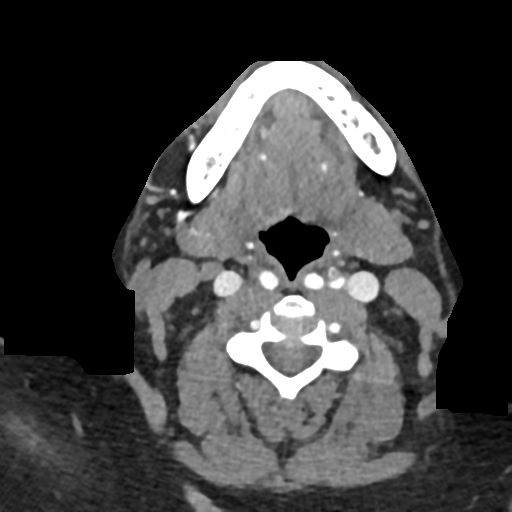
[im 187/327  bone]
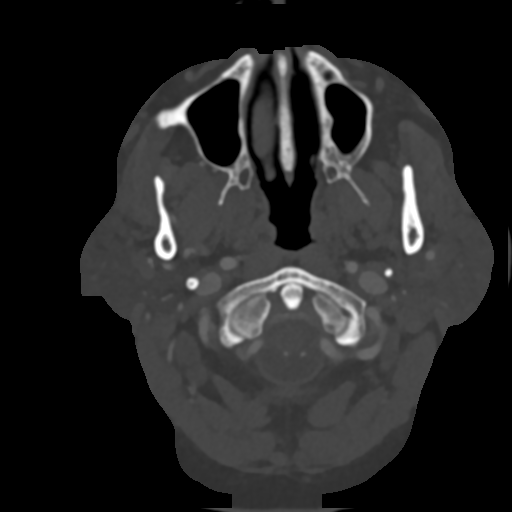
[im 233/327  soft-tissue]
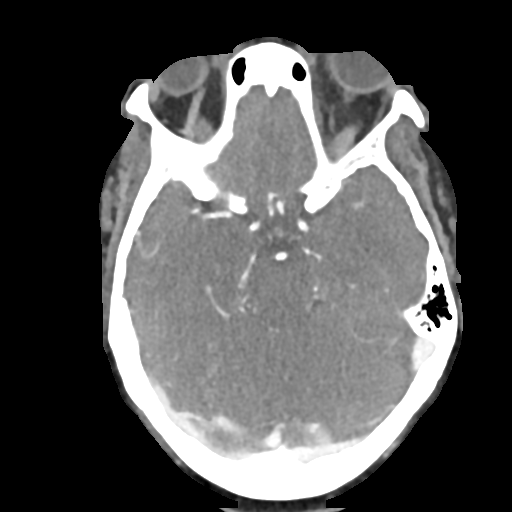
[im 280/327  bone]
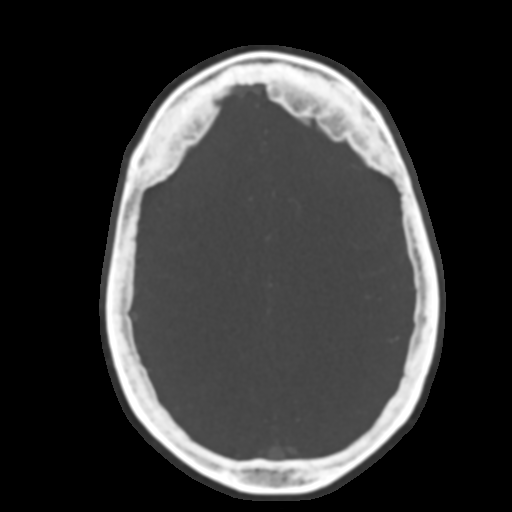

[8 of 33 positions shown; findings below may reference images not displayed]

RADIATION DOSE REDUCTION: This exam was performed according to the
departmental dose-optimization program which includes automated
exposure control, adjustment of the mA and/or kV according to
patient size and/or use of iterative reconstruction technique.

CONTRAST:  75mL OMNIPAQUE IOHEXOL 350 MG/ML SOLN
FINDINGS: CTA NECK FINDINGS

Aortic arch: Great vessel origins are patent without significant
stenosis.

Right carotid system: No evidence of dissection, stenosis (50% or
greater) or occlusion. Mild atherosclerosis at the carotid
bifurcation. Retropharyngeal course.

Left carotid system: No evidence of dissection, stenosis (50% or
greater) or occlusion. Mild-to-moderate atherosclerosis at the
carotid bifurcation without greater than 50% narrowing.

Vertebral arteries: Codominant. No evidence of dissection, stenosis
(50% or greater) or occlusion.

Skeleton: No acute findings.

Other neck: Please see recent CT neck for better characterization
findings in the neck.

Upper chest: Mild mosaic ground-glass opacities in the lung apices,
better characterized on recent CT chest.

Review of the MIP images confirms the above findings

CTA HEAD FINDINGS

Anterior circulation: Bilateral intracranial ICAs, MCAs, and ACAs
are patent without proximal hemodynamically significant stenosis

Posterior circulation: Bilateral intradural vertebral arteries,
basilar artery, and posterior cerebral arteries are patent without
proximal hemodynamically significant stenosis.

Venous sinuses: As permitted by contrast timing, patent.

Review of the MIP images confirms the above findings
IMPRESSION: 1. No large vessel occlusion or proximal hemodynamically significant
stenosis in the head or neck.
2. Please see recent CT neck and CT chest for soft tissue neck and
intrathoracic findings.

Findings communicated via secure text page to Dr. ALLOUSHI 7040 a
a at [DATE].

## 2022-10-28 IMAGING — MR MR HEAD W/O CM
6 of 10 series · 28 of 48 positions shown · non-contrast
Comparison: 3640

CLINICAL DATA: Neuro deficit, acute, stroke suspected

EXAM:
MRI HEAD WITHOUT CONTRAST
TECHNIQUE: Multiplanar, multiecho pulse sequences of the brain and surrounding
structures were obtained without intravenous contrast.

[Series 2: DWI · axial · 3.0mm · 0.94mm/px · z∈[-149,+2]mm · 9 of 106 slices shown (1 of 2)]
[im 1/106]
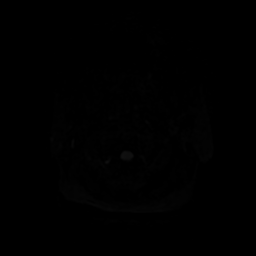
[im 14/106]
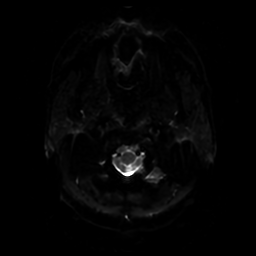
[im 27/106]
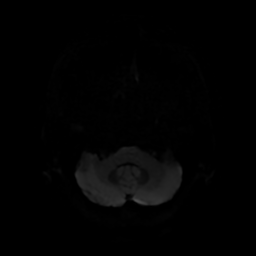
[im 40/106]
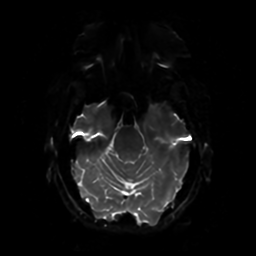
[im 53/106]
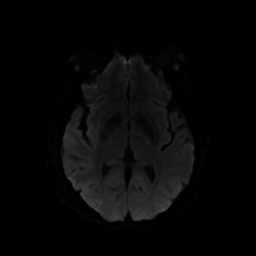
[im 66/106]
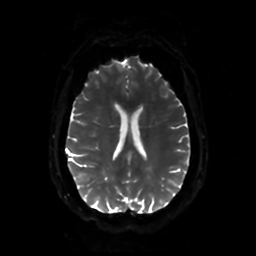
[im 79/106]
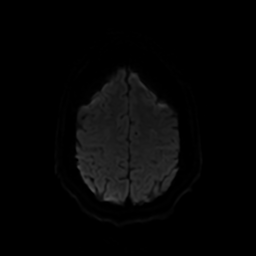
[im 92/106]
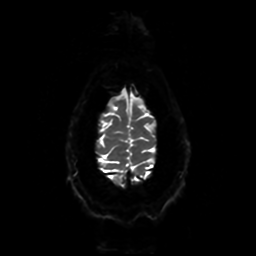
[im 106/106]
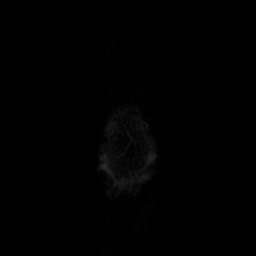

[Series 3: DWI · coronal · 4.0mm · 0.94mm/px · 6 of 74 slices shown (2 of 2)]
[im 1/74]
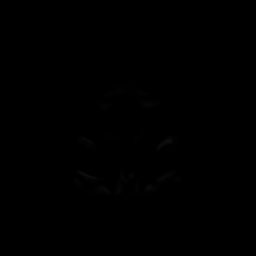
[im 15/74]
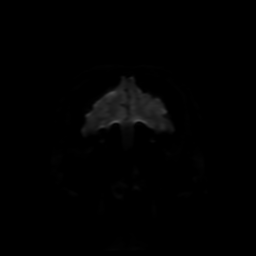
[im 30/74]
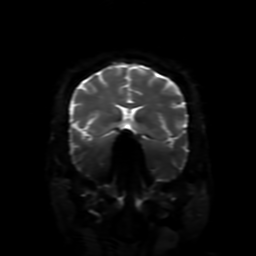
[im 44/74]
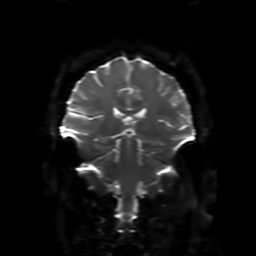
[im 59/74]
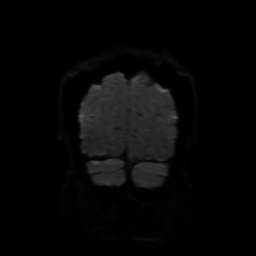
[im 74/74]
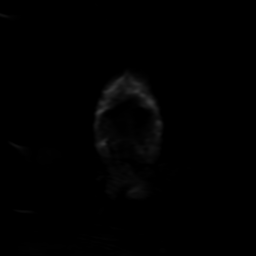

[Series 4: FLAIR · axial · 4.0mm · 0.45mm/px · z∈[-147,+7]mm · 3 of 37 slices shown (1 of 2)]
[im 1/37]
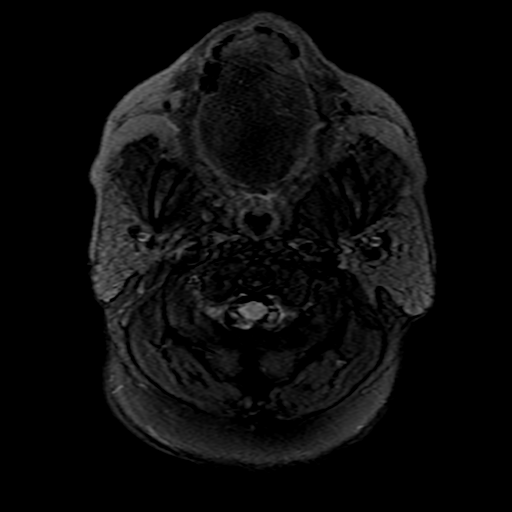
[im 19/37]
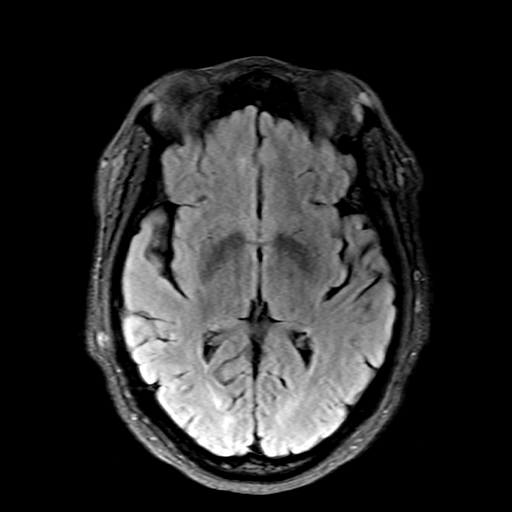
[im 37/37]
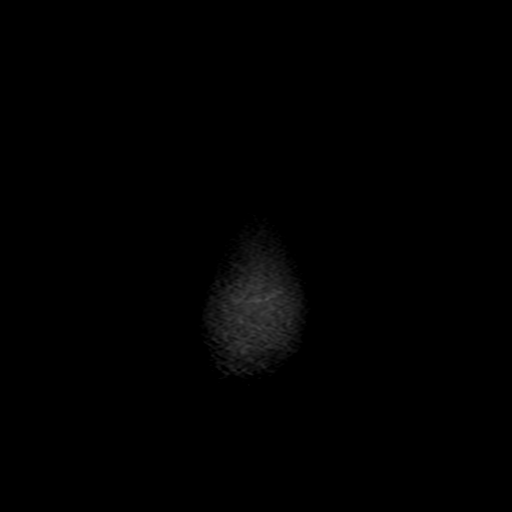

[Series 6: FLAIR · sagittal · 5.0mm · 0.23mm/px · 2 of 24 slices shown (2 of 2)]
[im 1/24]
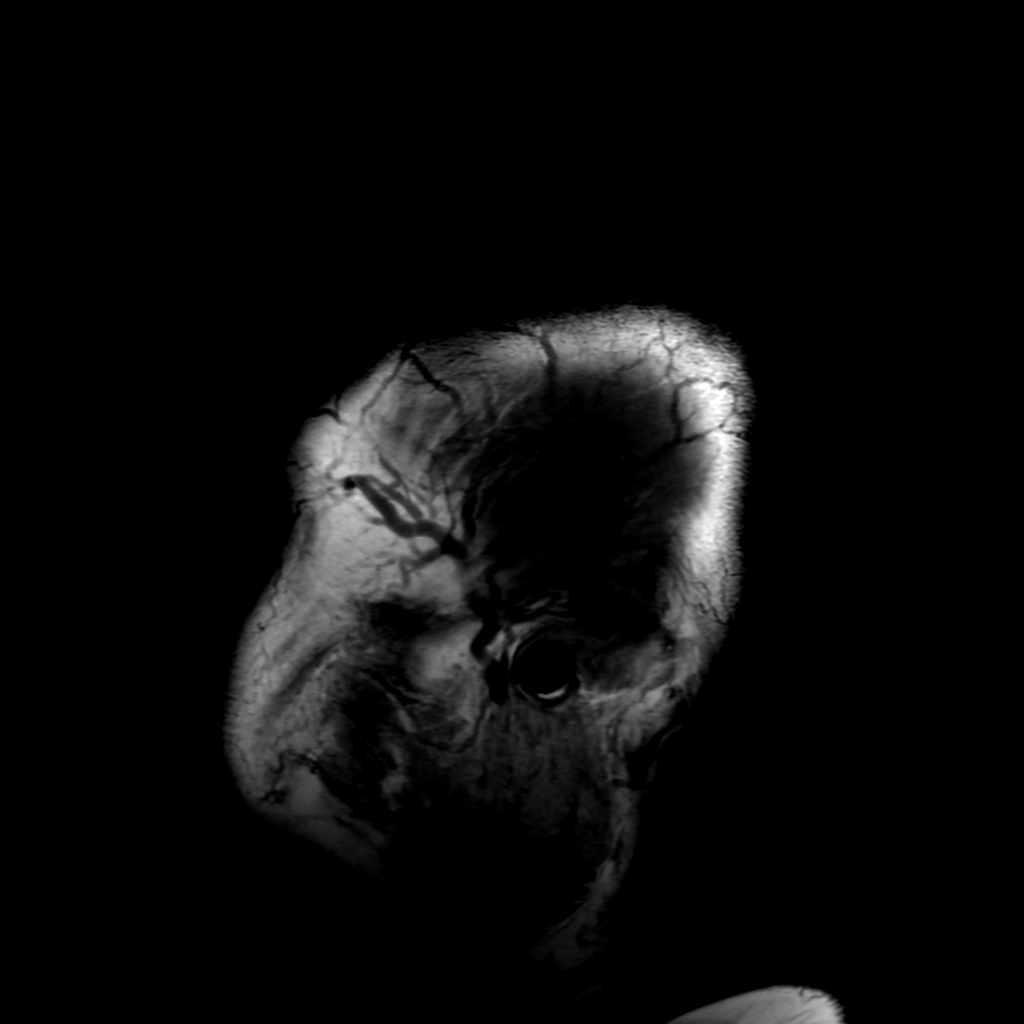
[im 24/24]
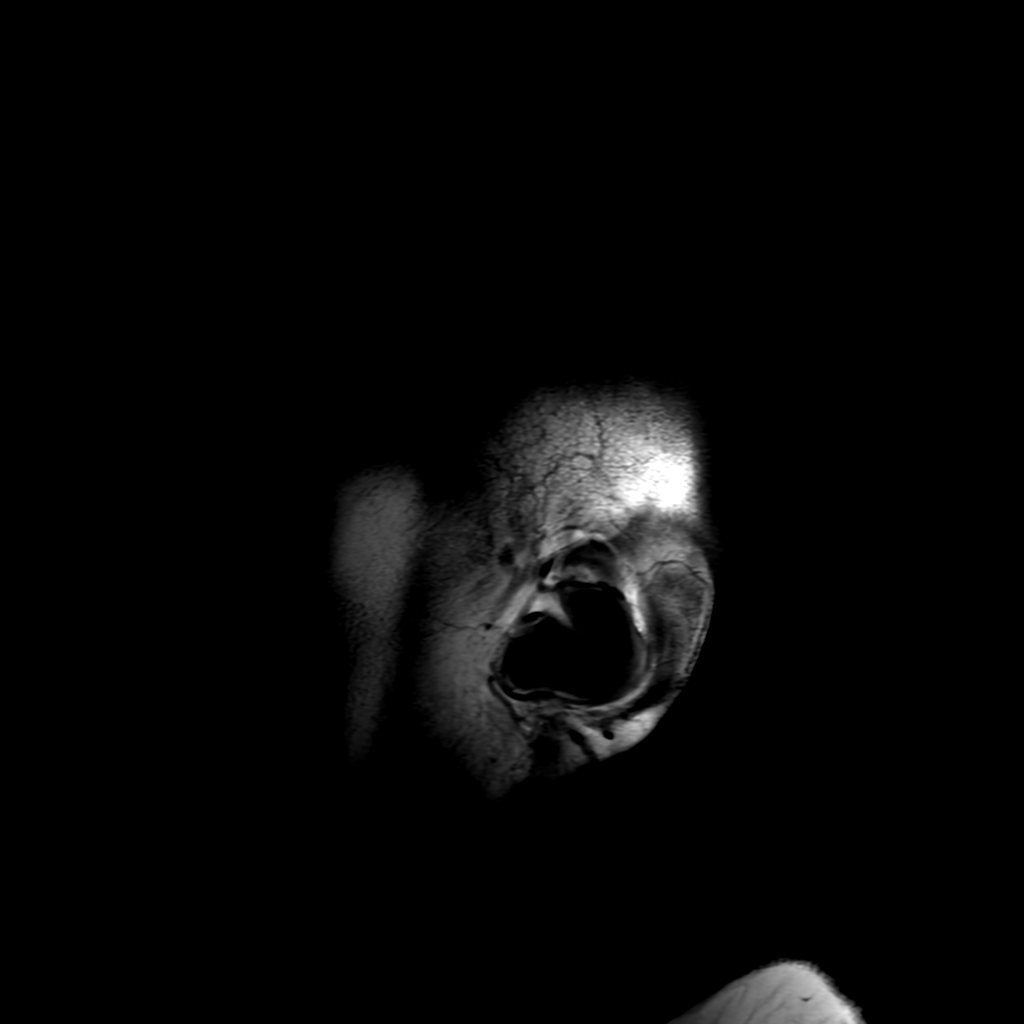

[Series 250: ADC · axial · 3.0mm · 0.94mm/px · z∈[-149,+2]mm · 5 of 52 slices shown (1 of 2)]
[im 1/52]
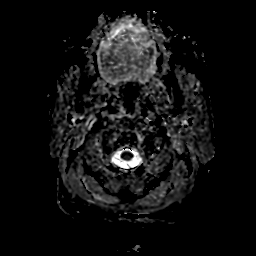
[im 13/52]
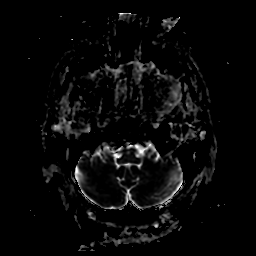
[im 26/52]
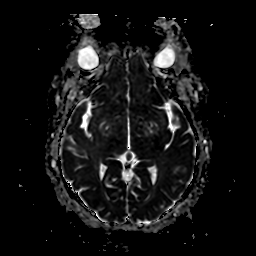
[im 39/52]
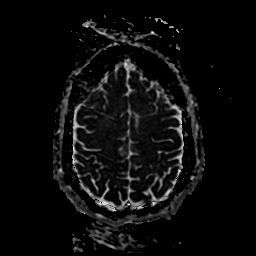
[im 52/52]
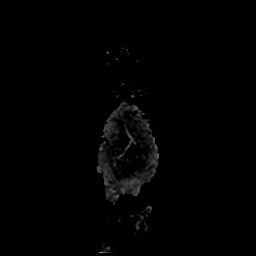

[Series 350: ADC · coronal · 4.0mm · 0.94mm/px · 3 of 37 slices shown (2 of 2)]
[im 1/37]
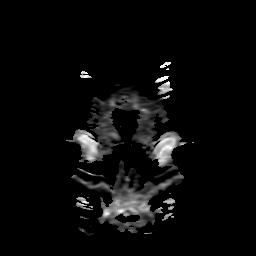
[im 19/37]
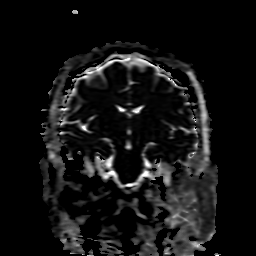
[im 37/37]
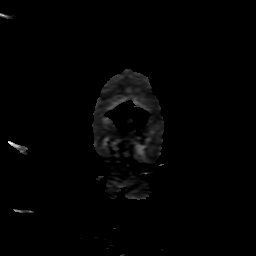

[28 of 48 positions shown; findings below may reference images not displayed]

FINDINGS: Brain: There is no acute infarction or intracranial hemorrhage.
There is no intracranial mass, mass effect, or edema. There is no
hydrocephalus or extra-axial fluid collection. Ventricles and sulci
are normal in size and configuration. Patchy foci of T2
hyperintensity in the supratentorial white matter are nonspecific
but may reflect mild chronic microvascular ischemic changes.

Vascular: Major vessel flow voids at the skull base are preserved.

Skull and upper cervical spine: Normal marrow signal is preserved.

Sinuses/Orbits: Paranasal sinuses are aerated. Bilateral lens
replacements.

Other: Sella is unremarkable. Minor patchy mastoid fluid
opacification.
IMPRESSION: No acute infarction, hemorrhage, or mass.

Mild chronic microvascular ischemic changes.

## 2022-10-28 IMAGING — CT CT HEAD CODE STROKE
4 series · 17 of 47 positions shown, 19 images · non-contrast
Comparison: CT head June 21, 2021.

CLINICAL DATA: Code stroke.  Neuro deficit, acute, stroke suspected



[Series 2: head 5.0 st · axial · 0.44mm/px · z∈[+1209,+1329]mm · 7 of 34 slices shown, 9 images]
[im 5/34  brain]
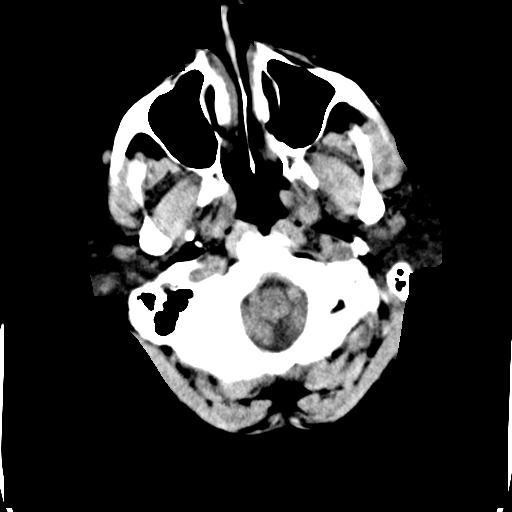
[im 5/34  bone]
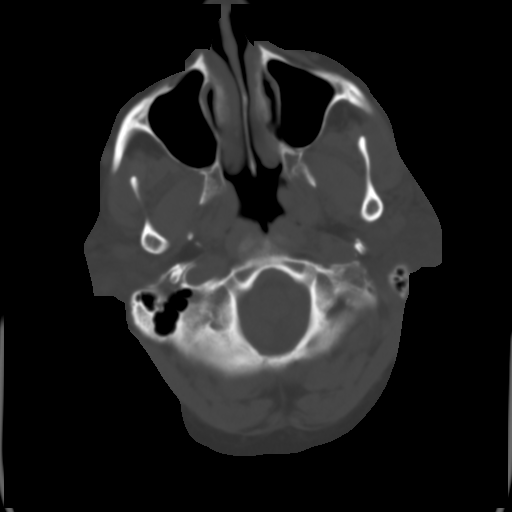
[im 9/34  brain]
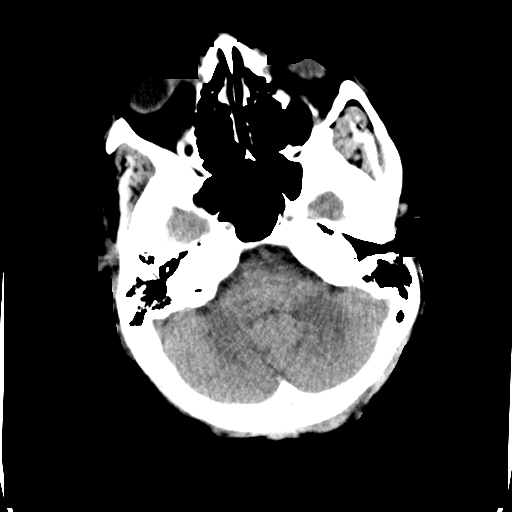
[im 13/34  brain]
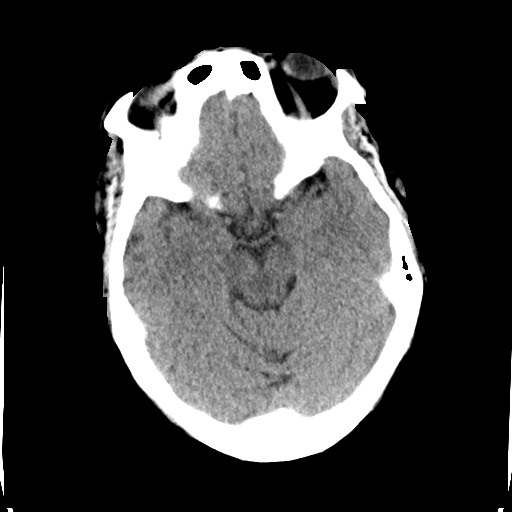
[im 17/34  brain]
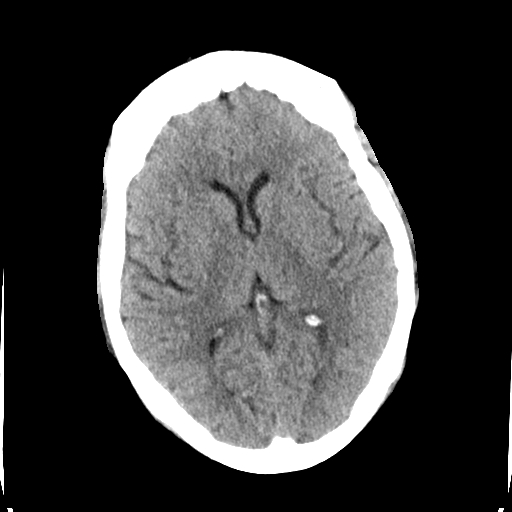
[im 21/34  brain]
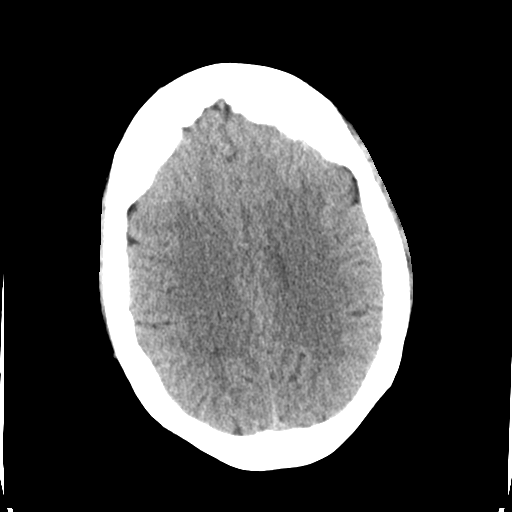
[im 21/34  bone]
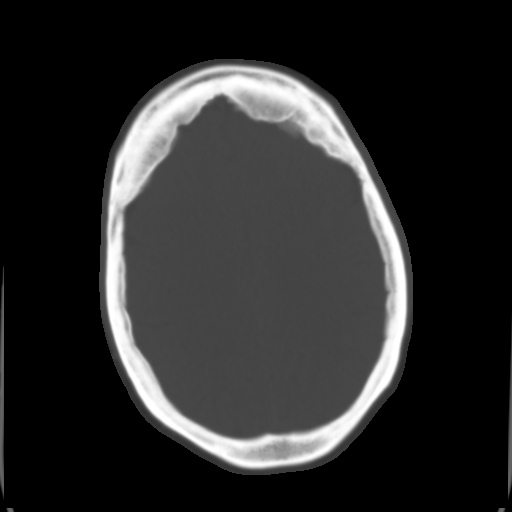
[im 25/34  brain]
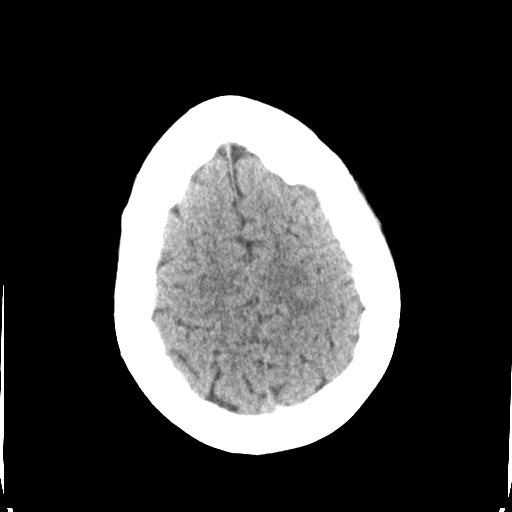
[im 29/34  brain]
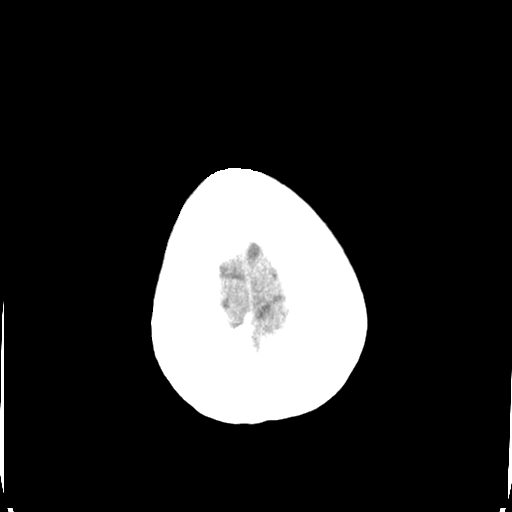

[Series 4: head 2.0 bone · axial · 0.44mm/px · z∈[+1205,+1263]mm · 4 of 85 slices shown]
[im 9/85  bone]
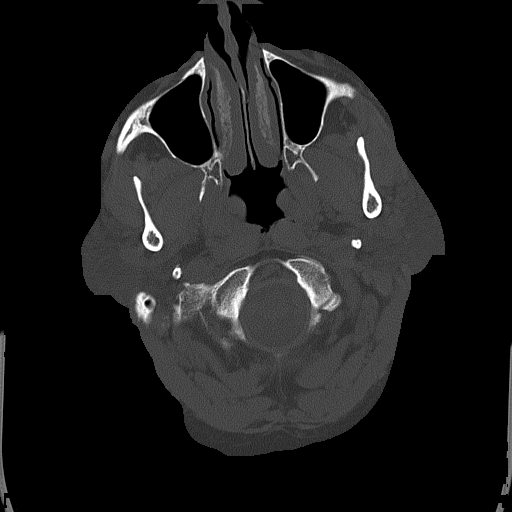
[im 17/85  bone]
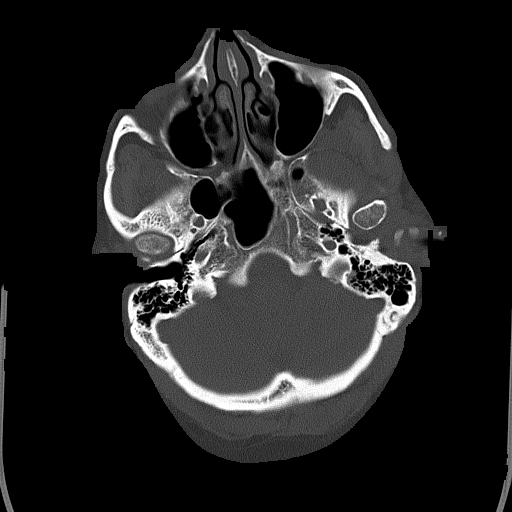
[im 26/85  bone]
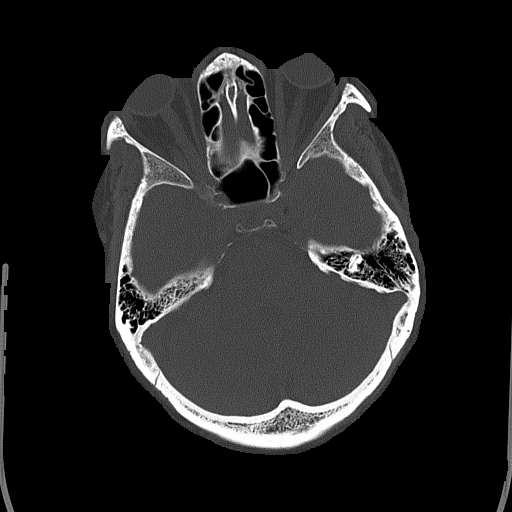
[im 38/85  bone]
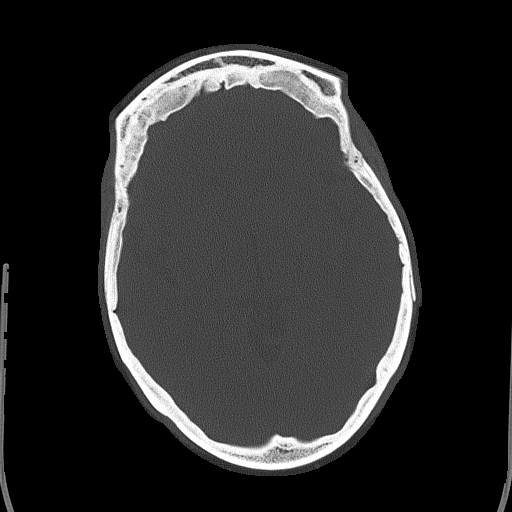

[Series 5: head 3.0 cor st · coronal · 0.32mm/px · 3 of 67 slices shown]
[im 23/67  brain]
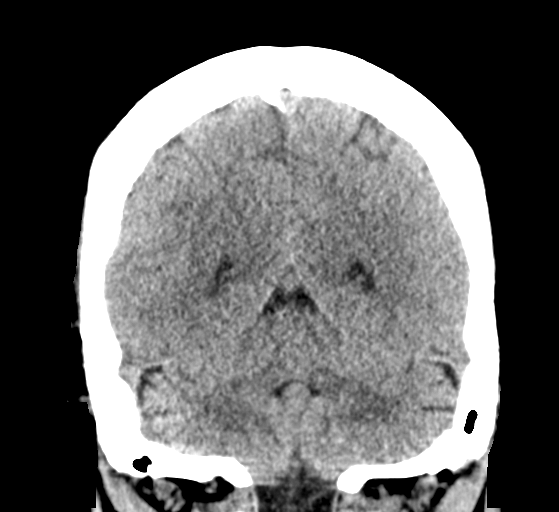
[im 30/67  brain]
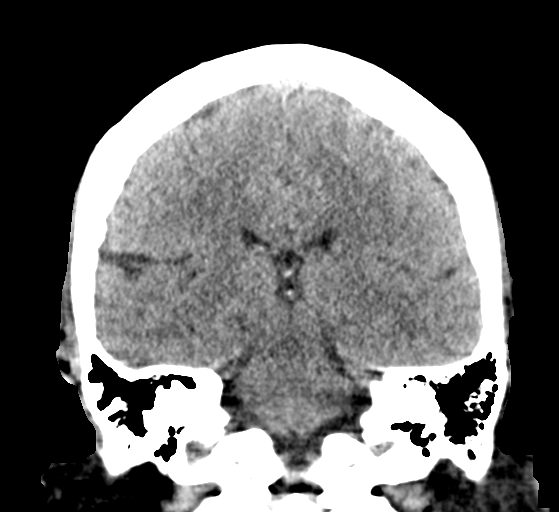
[im 37/67  brain]
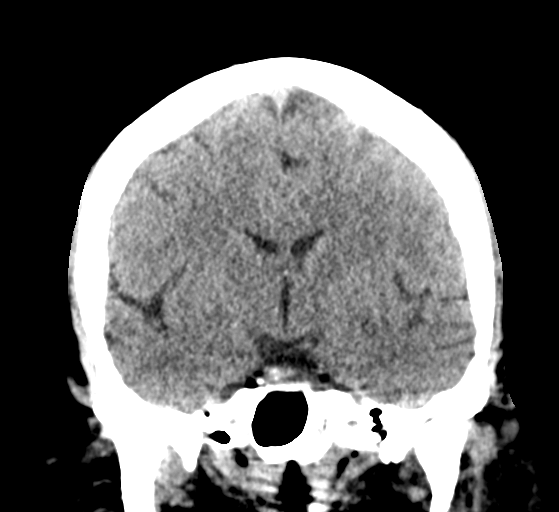

[Series 6: head 3.0 sag st · sagittal · 0.34mm/px · 3 of 64 slices shown]
[im 22/64  brain]
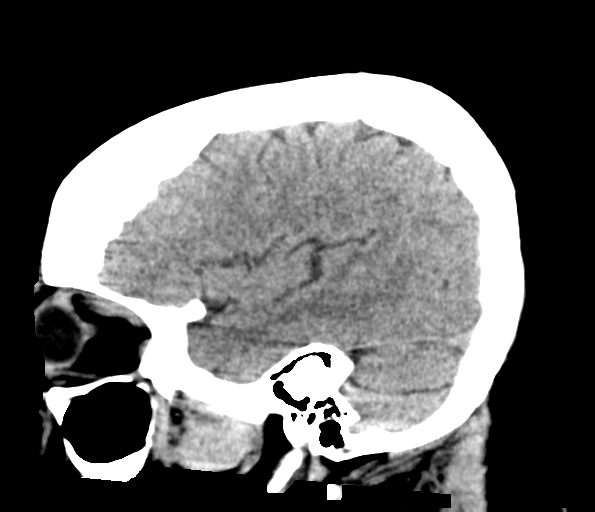
[im 32/64  brain]
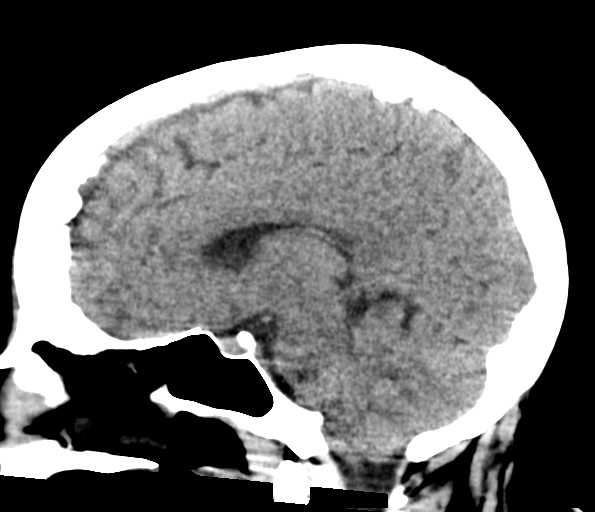
[im 43/64  brain]
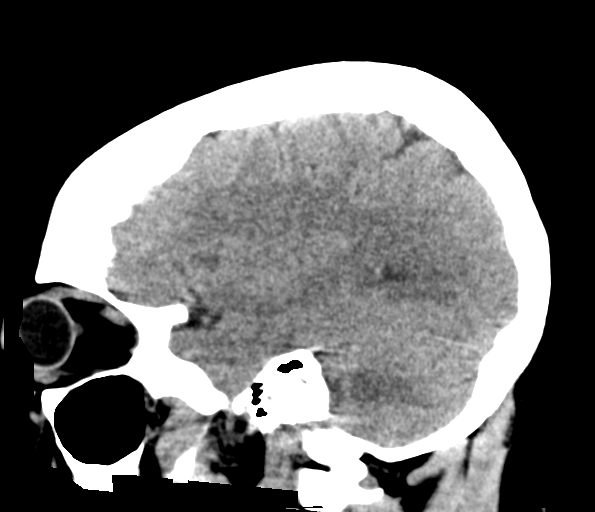

[17 of 47 positions shown; findings below may reference images not displayed]

FINDINGS: Brain: No evidence of acute large vascular territory infarction,
hemorrhage, hydrocephalus, extra-axial collection or mass
lesion/mass effect.

Vascular: No hyperdense vessel identified.

Skull: No acute fracture.  Hyperostosis frontalis.

Sinuses/Orbits: Clear sinuses.  No acute orbital findings.

Other: No mastoid effusions

ASPECTS ([place] Stroke Program Early CT Score) Total score (0-10
with 10 being normal): 10.
IMPRESSION: 1. No evidence of acute intracranial abnormality.
2. ASPECTS is 10.

Code stroke imaging results were communicated on 11/28/2021 at [DATE] to provider Dr. Yoel via secure text paging.

## 2022-10-31 ENCOUNTER — Other Ambulatory Visit: Payer: Self-pay

## 2022-10-31 DIAGNOSIS — N186 End stage renal disease: Secondary | ICD-10-CM

## 2022-10-31 NOTE — Telephone Encounter (Signed)
Spoke with patient. Surgery scheduled for March 22 with instructions provided. Patient verbalized understanding. Instructions faxed to [facility] [phone].

## 2022-10-31 NOTE — Telephone Encounter (Signed)
Left voice mail message for patient to return call.

## 2022-11-07 ENCOUNTER — Other Ambulatory Visit: Payer: Self-pay

## 2022-11-07 ENCOUNTER — Encounter (HOSPITAL_COMMUNITY): Payer: Self-pay

## 2022-11-07 ENCOUNTER — Emergency Department (HOSPITAL_COMMUNITY): Payer: 59

## 2022-11-07 ENCOUNTER — Emergency Department (HOSPITAL_COMMUNITY)
Admission: EM | Admit: 2022-11-07 | Discharge: 2022-11-07 | Disposition: A | Discharge status: Home / Self Care | Payer: 59 | Attending: Emergency Medicine | Admitting: Emergency Medicine

## 2022-11-07 DIAGNOSIS — N186 End stage renal disease: Secondary | ICD-10-CM | POA: Insufficient documentation

## 2022-11-07 DIAGNOSIS — J449 Chronic obstructive pulmonary disease, unspecified: Secondary | ICD-10-CM | POA: Insufficient documentation

## 2022-11-07 DIAGNOSIS — R42 Dizziness and giddiness: Secondary | ICD-10-CM | POA: Diagnosis not present

## 2022-11-07 DIAGNOSIS — Z992 Dependence on renal dialysis: Secondary | ICD-10-CM | POA: Insufficient documentation

## 2022-11-07 DIAGNOSIS — E1122 Type 2 diabetes mellitus with diabetic chronic kidney disease: Secondary | ICD-10-CM | POA: Diagnosis not present

## 2022-11-07 DIAGNOSIS — Z7901 Long term (current) use of anticoagulants: Secondary | ICD-10-CM | POA: Diagnosis not present

## 2022-11-07 DIAGNOSIS — I5032 Chronic diastolic (congestive) heart failure: Secondary | ICD-10-CM | POA: Insufficient documentation

## 2022-11-07 DIAGNOSIS — R112 Nausea with vomiting, unspecified: Secondary | ICD-10-CM | POA: Insufficient documentation

## 2022-11-07 DIAGNOSIS — R1013 Epigastric pain: Secondary | ICD-10-CM | POA: Diagnosis not present

## 2022-11-07 DIAGNOSIS — J45909 Unspecified asthma, uncomplicated: Secondary | ICD-10-CM | POA: Diagnosis not present

## 2022-11-07 DIAGNOSIS — Z79899 Other long term (current) drug therapy: Secondary | ICD-10-CM | POA: Insufficient documentation

## 2022-11-07 DIAGNOSIS — I120 Hypertensive chronic kidney disease with stage 5 chronic kidney disease or end stage renal disease: Secondary | ICD-10-CM | POA: Diagnosis not present

## 2022-11-07 DIAGNOSIS — Z794 Long term (current) use of insulin: Secondary | ICD-10-CM | POA: Insufficient documentation

## 2022-11-07 LAB — CBC
HCT: 36.6 % (ref 36.0–46.0)
Hemoglobin: 11.7 g/dL — ABNORMAL LOW (ref 12.0–15.0)
MCH: 29.8 pg (ref 26.0–34.0)
MCHC: 32 g/dL (ref 30.0–36.0)
MCV: 93.1 fL (ref 80.0–100.0)
Platelets: 185 10*3/uL (ref 150–400)
RBC: 3.93 MIL/uL (ref 3.87–5.11)
RDW: 14.4 % (ref 11.5–15.5)
WBC: 5.7 10*3/uL (ref 4.0–10.5)
nRBC: 0 % (ref 0.0–0.2)

## 2022-11-07 LAB — URINALYSIS, W/ REFLEX TO CULTURE (INFECTION SUSPECTED)
Bilirubin Urine: NEGATIVE
Glucose, UA: NEGATIVE mg/dL
Ketones, ur: NEGATIVE mg/dL
Nitrite: NEGATIVE
Protein, ur: 100 mg/dL — AB
Specific Gravity, Urine: 1.015 (ref 1.005–1.030)
pH: 5 (ref 5.0–8.0)

## 2022-11-07 LAB — COMPREHENSIVE METABOLIC PANEL
ALT: 21 U/L (ref 0–44)
AST: 23 U/L (ref 15–41)
Albumin: 3.5 g/dL (ref 3.5–5.0)
Alkaline Phosphatase: 80 U/L (ref 38–126)
Anion gap: 9 (ref 5–15)
BUN: 10 mg/dL (ref 8–23)
CO2: 29 mmol/L (ref 22–32)
Calcium: 8.5 mg/dL — ABNORMAL LOW (ref 8.9–10.3)
Chloride: 97 mmol/L — ABNORMAL LOW (ref 98–111)
Creatinine, Ser: 3.92 mg/dL — ABNORMAL HIGH (ref 0.44–1.00)
GFR, Estimated: 12 mL/min — ABNORMAL LOW (ref >60)
Glucose, Bld: 112 mg/dL — ABNORMAL HIGH (ref 70–99)
Potassium: 3.1 mmol/L — ABNORMAL LOW (ref 3.5–5.1)
Sodium: 135 mmol/L (ref 135–145)
Total Bilirubin: 0.7 mg/dL (ref 0.3–1.2)
Total Protein: 8.1 g/dL (ref 6.5–8.1)

## 2022-11-07 LAB — CBG MONITORING, ED: Glucose-Capillary: 103 mg/dL — ABNORMAL HIGH (ref 70–99)

## 2022-11-07 LAB — LIPASE, BLOOD: Lipase: 32 U/L (ref 11–51)

## 2022-11-07 MED ORDER — ONDANSETRON 4 MG PO TBDP: 4.0000 mg | ORAL_TABLET | Freq: Once | ORAL | Status: DC

## 2022-11-07 MED ORDER — LORAZEPAM 2 MG/ML IJ SOLN
0.5000 mg | Freq: Once | INTRAMUSCULAR | Status: AC
  Administered 2022-11-07: 0.5 mg via INTRAVENOUS
  Filled 2022-11-07: qty 1

## 2022-11-07 MED ORDER — LORAZEPAM 0.5 MG PO TABS: 0.5000 mg | ORAL_TABLET | Freq: Three times a day (TID) | ORAL | 0 refills | Status: DC | PRN

## 2022-11-07 MED ORDER — FENTANYL CITRATE PF 50 MCG/ML IJ SOSY
50.0000 ug | PREFILLED_SYRINGE | Freq: Once | INTRAMUSCULAR | Status: AC
  Administered 2022-11-07: 50 ug via INTRAVENOUS
  Filled 2022-11-07: qty 1

## 2022-11-07 NOTE — ED Provider Notes (Signed)
[facility] Provider Note   CSN: [id number] Arrival date & time: 11/07/22  1140     History {Add pertinent medical, surgical, social history, OB history to HPI:1} Chief Complaint  Patient presents with   Dizziness   Abdominal Pain    [name] is a 67 y.o. female.   Dizziness Abdominal Pain Patient says abdominal pain nausea vomiting.  Feels little dizzy.  Had dialysis done today.  States began have symptoms during it.  Has been dealing with the abdominal symptoms however for months now.  Has seen cardiology and GI.  History of reported gastroparesis.  Due to have endoscopy but needs to be done in the hospital due to high risk.  Has had some trouble eating.  Feels worse now.  Has had nausea since.  Reviewing notes that has history of prolonged QT.  Also history of atrial fibrillation is on anticoagulation.    Past Medical History:  Diagnosis Date   Acute on chronic systolic CHF (congestive heart failure) (HCC) 12/21/2020   Allergic rhinitis    Arthritis    Asthma    Chronic diastolic CHF (congestive heart failure) (HCC)    COPD (chronic obstructive pulmonary disease) (HCC)    Depression    DM (diabetes mellitus) ([place])    DVT (deep vein thrombosis) in pregnancy    GERD (gastroesophageal reflux disease)    HTN (hypertension)    Hyperlipidemia    Obesity    Pneumonia 04/2018   RIGHT LOBE   Sleep apnea    compliant with CPAP    Home Medications Prior to Admission medications   Medication Sig Start Date End Date Taking? Authorizing Provider  acetaminophen (TYLENOL) 325 MG tablet Take 2 tablets (650 mg total) by mouth every 6 (six) hours as needed for mild pain or moderate pain. 12/07/21   Arrien, Jimmy Picket, MD  albuterol (PROVENTIL HFA) 108 (90 Base) MCG/ACT inhaler Inhale 1-2 puffs into the lungs every 6 (six) hours as needed for wheezing or shortness of breath. 10/13/22   [name], NP  amiodarone  (PACERONE) 200 MG tablet TAKE 1 TABLET BY MOUTH TWICE A DAY 04/25/22   Bensimhon, Shaune Pascal, MD  amoxicillin (AMOXIL) 500 MG capsule Take 500 mg by mouth 3 (three) times daily. 09/06/22   [provider]  apixaban (ELIQUIS) 5 MG TABS tablet TAKE ONE TABLET BY MOUTH TWICE DAILY at [place] Patient taking differently: Take 5 mg by mouth in the morning and at bedtime. 02/24/22   Shelly Coss, MD  benzonatate (TESSALON PERLES) 100 MG capsule Take 1 capsule (100 mg total) by mouth 3 (three) times daily as needed for cough. 05/23/22   [name], NP  Blood Glucose Monitoring Suppl (ACCU-CHEK GUIDE) w/Device KIT Use to check blood sugar TID. Dx: E11.69 06/28/22   Charlott Rakes, MD  busPIRone (BUSPAR) 5 MG tablet TAKE ONE TABLET BY MOUTH TWICE DAILY @ 9AM & 5PM 09/06/22   [name], NP  colchicine 0.6 MG tablet TAKE 0.5 TABLETS BY MOUTH DAILY. 07/28/22   Charlott Rakes, MD  Dextromethorphan HBr (TUSSIN COUGH) 15 MG CAPS Take 1 capsule (15 mg total) by mouth 2 (two) times daily as needed. 09/28/22   [name], NP  diphenhydrAMINE (BENADRYL) 25 mg capsule Take 25 mg by mouth every 6 (six) hours as needed for allergies or itching.    [provider]  Dulaglutide (TRULICITY) 1.5 0000000 SOPN Inject 1.5 mg into  the skin once a week. 07/05/22   [name], NP  DULoxetine (CYMBALTA) 60 MG capsule Take 1 capsule (60 mg total) by mouth daily. 07/05/22   [name], NP  EPINEPHrine 0.3 mg/0.3 mL IJ SOAJ injection Inject 0.3 mg into the muscle as needed for anaphylaxis. 06/25/20   [name], NP  fluticasone (FLONASE) 50 MCG/ACT nasal spray Place 2 sprays into both nostrils daily as needed for allergies or rhinitis. 09/22/22   [name], NP  glucose blood (ACCU-CHEK GUIDE) test strip USE TO CHECK BLOOD SUGAR 3 TIMES DAILY 04/03/22   Charlott Rakes, MD  guaiFENesin 200 MG tablet Take 1 tablet (200 mg total) by mouth every 4 (four) hours as  needed for cough or to loosen phlegm. 07/05/22   [name], NP  insulin lispro (HUMALOG KWIKPEN) 100 UNIT/ML KwikPen Inject 10 Units into the skin 3 (three) times daily. 07/05/22   [name], NP  isosorbide mononitrate (IMDUR) 30 MG 24 hr tablet Take 0.5 tablets (15 mg total) by mouth daily. 03/18/22 06/20/22  Arrien, Jimmy Picket, MD  KLOR-CON M20 20 MEQ tablet Take 20 mEq by mouth 2 (two) times daily. 04/18/22   [provider]  lidocaine-prilocaine (EMLA) cream SMARTSIG:sparingly Topical 3 Times a Week 06/01/22   [provider]  linaclotide (LINZESS) 72 MCG capsule TAKE 1 CAPSULE (72 MCG TOTAL) BY MOUTH DAILY BEFORE BREAKFAST. IF BM NOT ACHIEVE WITH 1 CAPSULE SHE MAY TAKE 2 DAILY. STOP IF DIARRHEA OCCURS 07/05/22   [name], NP  loratadine (CLARITIN) 10 MG tablet TAKE ONE TABLET BY MOUTH DAILY AT 9AM 09/06/22   [name], NP  metolazone (ZAROXOLYN) 2.5 MG tablet Take 2.5 mg by mouth 2 (two) times a week. 04/20/22   [provider]  metoprolol succinate (TOPROL-XL) 25 MG 24 hr tablet TAKE 1 TABLET BY MOUTH IN THE MORNING AND AT BEDTIME 09/12/22   Vickie Epley, MD  midodrine (PROAMATINE) 10 MG tablet Take by mouth. 08/16/22   [provider]  Multiple Vitamins-Minerals (MULTIVITAMIN WOMEN PO) Take 1 tablet by mouth daily.    [provider]  multivitamin (RENA-VIT) TABS tablet Take 1 tablet by mouth daily. 04/27/22   [provider]  nitroGLYCERIN (NITROSTAT) 0.4 MG SL tablet Place 1 tablet (0.4 mg total) under the tongue every 5 (five) minutes x 3 doses as needed for chest pain. 01/08/14   Charolette Forward, MD  ondansetron (ZOFRAN) 4 MG tablet TAKE 1 TABLET BY MOUTH EVERY 8 HOURS AS NEEDED FOR NAUSEA FOR VOMITING 09/28/22   [name], NP  pantoprazole (PROTONIX) 40 MG tablet TAKE ONE TABLET BY MOUTH DAILY AT 9AM 04/17/22   [name], NP  polyethylene glycol powder (GLYCOLAX/MIRALAX) 17  GM/SCOOP powder Take 17 g by mouth daily. Patient taking differently: Take 17 g by mouth daily as needed for mild constipation. 02/25/22   Shelly Coss, MD  pregabalin (LYRICA) 100 MG capsule Take 1 capsule (100 mg total) by mouth 3 (three) times daily as needed (for pain). 07/05/22   [name], NP  RESTASIS 0.05 % ophthalmic emulsion Place 1 drop into both eyes in the morning and at bedtime. 11/19/17   [provider]  rosuvastatin (CRESTOR) 10 MG tablet TAKE ONE TABLET BY MOUTH DAILY AT 5PM 08/16/22   Joette Catching, PA-C  senna-docusate (SENOKOT-S) 8.6-50 MG tablet Take 1 tablet by mouth 2 (two) times daily. 02/24/22   Shelly Coss, MD  torsemide (DEMADEX) 100 MG tablet Take 1 tablet by mouth. Monday Wednesday and Friday 04/02/22   [provider]  traMADol (ULTRAM) 50 MG tablet Take 50 mg by mouth every 6 (six) hours as needed. 09/06/22   [provider]  VELPHORO 500 MG chewable tablet Chew 500 mg by mouth 3 (three) times daily. 09/15/22   [provider]      Allergies    Bee pollen, Bee venom, and Sulfa antibiotics    Review of Systems   Review of Systems  Gastrointestinal:  Positive for abdominal pain.  Neurological:  Positive for dizziness.    Physical Exam Updated Vital Signs BP 116/68   Pulse 71   Temp (!) 97.4 F (36.3 C) (Oral)   Resp 16   Ht 5\' 7"  (1.702 m)   Wt 124.7 kg   SpO2 99%   BMI 43.07 kg/m  Physical Exam Vitals and nursing note reviewed.  Pulmonary:     Breath sounds: Normal breath sounds.  Abdominal:     Tenderness: There is abdominal tenderness.     Comments: Mild upper abdominal pain.  No distention.  No rebound or guarding.  Neurological:     Mental Status: She is alert.     ED Results / Procedures / Treatments   Labs (all labs ordered are listed, but only abnormal results are displayed) Labs Reviewed  CBC  URINALYSIS, ROUTINE W REFLEX MICROSCOPIC  COMPREHENSIVE METABOLIC PANEL  LIPASE,  BLOOD  CBG MONITORING, ED    EKG EKG Interpretation  Date/Time:  Tuesday November 07 2022 12:41:18 EDT Ventricular Rate:  73 PR Interval:    QRS Duration: 121 QT Interval:  515 QTC Calculation: 568 R Axis:   105 Text Interpretation: Accelerated junctional rhythm Nonspecific intraventricular conduction delay Nonspecific T abnrm, anterolateral leads no definite P waves seen. Confirmed by Davonna Belling [phone]) on 11/07/2022 12:50:12 PM  Radiology DG Chest 2 View  Result Date: 11/07/2022 CLINICAL DATA:  Cough and dizziness.  Recent dialysis. EXAM: CHEST - 2 VIEW COMPARISON:  Radiographs 10/23/2022 and 09/14/2022.  CT 09/03/2022. FINDINGS: Right IJ hemodialysis catheter tip is unchanged at the level of the mid right atrium. Stable cardiomegaly post median sternotomy, mitral and tricuspid valve replacement and clipping of the left atrial appendage. The pulmonary vascularity appears normal. No confluent airspace opacity, pleural effusion or pneumothorax. No acute osseous findings are seen. IMPRESSION: No acute cardiopulmonary process. Stable cardiomegaly. Electronically Signed   By: Richardean Sale M.D.   On: 11/07/2022 12:20    Procedures Procedures  {Document cardiac monitor, telemetry assessment procedure when appropriate:1}  Medications Ordered in ED Medications - No data to display  ED Course/ Medical Decision Making/ A&P   {   Click here for ABCD2, HEART and other calculatorsREFRESH Note before signing :1}                          Medical Decision Making  ***  {Document critical care time when appropriate:1} {Document review of labs and clinical decision tools ie heart score, Chads2Vasc2 etc:1}  {Document your independent review of radiology images, and any outside records:1} {Document your discussion with family members, caretakers, and with consultants:1} {Document social determinants of health affecting pt's care:1} {Document your decision making why or why not admission,  treatments were needed:1} Final Clinical Impression(s) / ED Diagnoses Final diagnoses:  None    Rx / DC Orders ED Discharge Orders     None

## 2022-11-07 NOTE — ED Notes (Signed)
Pt verbalized understanding of discharge instructions. Opportunity for questions provided.  

## 2022-11-07 NOTE — ED Provider Triage Note (Signed)
Emergency Medicine Provider Triage Evaluation Note  [name] , a 67 y.o. female  was evaluated in triage.  Pt complains of weakness. Pt report she  has had recurrent nausea and stomach discomfort for 2-3 months.  Not eating or drinking much.  Felt more weak after dialysis today.  New cough  Review of Systems  Positive: As above Negative: As above  Physical Exam  BP 116/68   Pulse 71   Temp (!) 97.4 F (36.3 C) (Oral)   Resp 16   Ht 5\' 7"  (1.702 m)   Wt 124.7 kg   SpO2 99%   BMI 43.07 kg/m  Gen:   Awake, no distress   Resp:  Normal effort  MSK:   Moves extremities without difficulty  Other:    Medical Decision Making  Medically screening exam initiated at 11:46 AM.  Appropriate orders placed.  [name] was informed that the remainder of the evaluation will be completed by another provider, this initial triage assessment does not replace that evaluation, and the importance of remaining in the ED until their evaluation is complete.    Domenic Moras, PA-C 11/07/22 1147

## 2022-11-07 NOTE — ED Triage Notes (Signed)
Pt BIBA from dialysis. Pt has been feeling unwell- dizziness, nausea, emesis x 1 at dialysis. This is chronic, but worsening today. Pt c/o  generalized abd tenderness and RUQ.  Pt did receive full course of dialysis.   122/57 68 96% 121 CBG

## 2022-11-09 ENCOUNTER — Encounter (HOSPITAL_COMMUNITY): Payer: Self-pay | Admitting: Certified Registered"

## 2022-11-09 NOTE — Progress Notes (Addendum)
10/26/22 Primary Cardiologist:Bridgette Harrell Gave, MD   Chart reviewed as part of pre-operative protocol coverage. Because of [name]'s past medical history and time since last visit, he/she will require a follow-up visit in order to better assess preoperative cardiovascular risk.   Pre-op covering staff: - Please schedule appointment and call patient to inform them. - Please contact requesting surgeon's office via preferred method (i.e, phone, fax) to inform them of need for appointment prior to surgery.   Per office protocol, patient can hold Eliquis for 2 days prior to procedure.         [name] has an  appoint scheduled for 12/29/22.  I spoke with Dr Stanford Breed, the on call Dr and informed him of the above notes.Dr Stanford Breed will notify Dr Scot Dock.  I notified [name] of cancellation.

## 2022-11-10 ENCOUNTER — Ambulatory Visit (HOSPITAL_COMMUNITY): Admission: RE | Admit: 2022-11-10 | Payer: 59 | Source: Home / Self Care | Admitting: Vascular Surgery

## 2022-11-10 ENCOUNTER — Encounter (HOSPITAL_COMMUNITY): Admission: RE | Payer: Self-pay | Source: Home / Self Care

## 2022-11-10 SURGERY — REVISON OF ARTERIOVENOUS FISTULA
Anesthesia: Choice | Laterality: Left

## 2022-11-11 ENCOUNTER — Other Ambulatory Visit (HOSPITAL_COMMUNITY): Payer: Self-pay | Admitting: Family Medicine

## 2022-11-11 ENCOUNTER — Other Ambulatory Visit (HOSPITAL_BASED_OUTPATIENT_CLINIC_OR_DEPARTMENT_OTHER): Payer: Self-pay | Admitting: Cardiology

## 2022-11-11 DIAGNOSIS — E669 Obesity, unspecified: Secondary | ICD-10-CM

## 2022-11-11 DIAGNOSIS — I4819 Other persistent atrial fibrillation: Secondary | ICD-10-CM

## 2022-11-13 NOTE — Telephone Encounter (Signed)
Please review for refill. Thank you! 

## 2022-11-13 NOTE — Telephone Encounter (Signed)
Prescription refill request for Eliquis received. Indication: Afib  Last office visit: 10/20/22 [name]) Scr: 3.92 (11/07/22)  Age: 67 Weight: 124.7kg  Appropriate dose. Refill sent.

## 2022-11-14 ENCOUNTER — Telehealth: Payer: Self-pay | Admitting: Vascular Surgery

## 2022-11-14 ENCOUNTER — Other Ambulatory Visit (HOSPITAL_COMMUNITY): Payer: Self-pay | Admitting: Physician Assistant

## 2022-11-14 DIAGNOSIS — E669 Obesity, unspecified: Secondary | ICD-10-CM

## 2022-11-14 NOTE — Telephone Encounter (Signed)
left patient vm re her appt with Dr. Scharlene Gloss. Received staff mess from [facility] to schedule

## 2022-11-15 ENCOUNTER — Encounter (INDEPENDENT_AMBULATORY_CARE_PROVIDER_SITE_OTHER): Payer: Self-pay | Admitting: Primary Care

## 2022-11-15 ENCOUNTER — Ambulatory Visit (INDEPENDENT_AMBULATORY_CARE_PROVIDER_SITE_OTHER): Payer: 59 | Admitting: Primary Care

## 2022-11-15 VITALS — BP 127/82 | HR 77 | Resp 16 | Wt 267.6 lb

## 2022-11-15 DIAGNOSIS — F411 Generalized anxiety disorder: Secondary | ICD-10-CM | POA: Diagnosis not present

## 2022-11-15 DIAGNOSIS — Z09 Encounter for follow-up examination after completed treatment for conditions other than malignant neoplasm: Secondary | ICD-10-CM | POA: Diagnosis not present

## 2022-11-15 MED ORDER — ALPRAZOLAM 0.5 MG PO TABS: 0.5000 mg | ORAL_TABLET | Freq: Two times a day (BID) | ORAL | 0 refills | Status: DC | PRN

## 2022-11-15 NOTE — Progress Notes (Signed)
[facility]   Subjective:   [name] is a 67 y.o. female presents for ED follow up. Presented on  11/07/22, with abdominal pain nausea vomiting and  little dizzy. Note this was after having dialysis. Patient has No headache, No chest pain, No abdominal pain - No Nausea, No new weakness tingling or numbness, No Cough - shortness of breath  Past Medical History:  Diagnosis Date   Acute on chronic systolic CHF (congestive heart failure) (HCC) 12/21/2020   Allergic rhinitis    Arthritis    Asthma    Chronic diastolic CHF (congestive heart failure) (HCC)    COPD (chronic obstructive pulmonary disease) (HCC)    Depression    DM (diabetes mellitus) ([place])    DVT (deep vein thrombosis) in pregnancy    GERD (gastroesophageal reflux disease)    HTN (hypertension)    Hyperlipidemia    Obesity    Pneumonia 04/2018   RIGHT LOBE   Sleep apnea    compliant with CPAP     Allergies  Allergen Reactions   Bee Pollen Anaphylaxis and Other (See Comments)    UNCONFIRMED, but IS allergic to bee VENOM   Bee Venom Anaphylaxis   Sulfa Antibiotics Itching      Current Outpatient Medications on File Prior to Visit  Medication Sig Dispense Refill   acetaminophen (TYLENOL) 325 MG tablet Take 2 tablets (650 mg total) by mouth every 6 (six) hours as needed for mild pain or moderate pain.     albuterol (PROVENTIL HFA) 108 (90 Base) MCG/ACT inhaler Inhale 1-2 puffs into the lungs every 6 (six) hours as needed for wheezing or shortness of breath. 18 g 2   amiodarone (PACERONE) 200 MG tablet TAKE 1 TABLET BY MOUTH TWICE A DAY 180 tablet 3   benzonatate (TESSALON PERLES) 100 MG capsule Take 1 capsule (100 mg total) by mouth 3 (three) times daily as needed for cough. 20 capsule 0   Blood Glucose Monitoring Suppl (ACCU-CHEK GUIDE) w/Device KIT Use to check blood sugar TID. Dx: E11.69 1 kit 0   busPIRone (BUSPAR) 5 MG tablet TAKE ONE TABLET BY MOUTH TWICE DAILY @ 9AM & 5PM 180 tablet  11   colchicine 0.6 MG tablet TAKE 0.5 TABLETS BY MOUTH DAILY. 45 tablet 0   Dextromethorphan HBr (TUSSIN COUGH) 15 MG CAPS Take 1 capsule (15 mg total) by mouth 2 (two) times daily as needed. 28 capsule 0   diphenhydrAMINE (BENADRYL) 25 mg capsule Take 25 mg by mouth every 6 (six) hours as needed for allergies or itching.     Dulaglutide (TRULICITY) 1.5 0000000 SOPN Inject 1.5 mg into the skin once a week. 2 mL 2   DULoxetine (CYMBALTA) 60 MG capsule Take 1 capsule (60 mg total) by mouth daily. 180 capsule 1   ELIQUIS 5 MG TABS tablet TAKE 1 TABLET BY MOUTH TWICE A DAY 180 tablet 1   EPINEPHrine 0.3 mg/0.3 mL IJ SOAJ injection Inject 0.3 mg into the muscle as needed for anaphylaxis. 1 each 1   fluticasone (FLONASE) 50 MCG/ACT nasal spray Place 2 sprays into both nostrils daily as needed for allergies or rhinitis. 16 g 3   glucose blood (ACCU-CHEK GUIDE) test strip USE TO CHECK BLOOD SUGAR 3 TIMES DAILY 100 strip 3   guaiFENesin 200 MG tablet Take 1 tablet (200 mg total) by mouth every 4 (four) hours as needed for cough or to loosen phlegm. 30 suppository 0   insulin lispro (HUMALOG KWIKPEN) 100  UNIT/ML KwikPen Inject 10 Units into the skin 3 (three) times daily. 15 mL 3   isosorbide mononitrate (IMDUR) 30 MG 24 hr tablet Take 0.5 tablets (15 mg total) by mouth daily. 15 tablet 0   lidocaine-prilocaine (EMLA) cream Apply 1 Application topically Every Tuesday,Thursday,and Saturday with dialysis.     linaclotide (LINZESS) 72 MCG capsule TAKE 1 CAPSULE (72 MCG TOTAL) BY MOUTH DAILY BEFORE BREAKFAST. IF BM NOT ACHIEVE WITH 1 CAPSULE SHE MAY TAKE 2 DAILY. STOP IF DIARRHEA OCCURS (Patient taking differently: Take 72 mcg by mouth once a week.  STOP IF DIARRHEA OCCURS) 60 capsule 3   loratadine (CLARITIN) 10 MG tablet TAKE ONE TABLET BY MOUTH DAILY AT 9AM 30 tablet 11   LORazepam (ATIVAN) 0.5 MG tablet Take 1 tablet (0.5 mg total) by mouth 3 (three) times daily as needed for anxiety (nausea). 8 tablet 0    metoprolol succinate (TOPROL-XL) 25 MG 24 hr tablet TAKE 1 TABLET BY MOUTH IN THE MORNING AND AT BEDTIME 180 tablet 1   midodrine (PROAMATINE) 10 MG tablet Take 10 mg by mouth Every Tuesday,Thursday,and Saturday with dialysis. 30 min before Dialysis     Multiple Vitamins-Minerals (MULTIVITAMIN WOMEN PO) Take 1 tablet by mouth daily.     multivitamin (RENA-VIT) TABS tablet Take 1 tablet by mouth daily.     nitroGLYCERIN (NITROSTAT) 0.4 MG SL tablet Place 1 tablet (0.4 mg total) under the tongue every 5 (five) minutes x 3 doses as needed for chest pain. 25 tablet 12   ondansetron (ZOFRAN) 4 MG tablet TAKE 1 TABLET BY MOUTH EVERY 8 HOURS AS NEEDED FOR NAUSEA FOR VOMITING 30 tablet 0   pantoprazole (PROTONIX) 40 MG tablet TAKE ONE TABLET BY MOUTH DAILY AT 9AM 90 tablet 11   polyethylene glycol powder (GLYCOLAX/MIRALAX) 17 GM/SCOOP powder Take 17 g by mouth daily. (Patient taking differently: Take 17 g by mouth daily as needed for mild constipation.) 238 g 0   pregabalin (LYRICA) 100 MG capsule Take 1 capsule (100 mg total) by mouth 3 (three) times daily as needed (for pain). 90 capsule 2   RESTASIS 0.05 % ophthalmic emulsion Place 1 drop into both eyes in the morning and at bedtime.  12   rosuvastatin (CRESTOR) 10 MG tablet TAKE 1 TABLET BY MOUTH EVERY DAY 90 tablet 3   senna-docusate (SENOKOT-S) 8.6-50 MG tablet Take 1 tablet by mouth 2 (two) times daily. 14 tablet 0   torsemide (DEMADEX) 100 MG tablet Take 100 mg by mouth See admin instructions.  Sunday,Monday, Wednesday and Friday     traMADol (ULTRAM) 50 MG tablet Take 50 mg by mouth every 6 (six) hours as needed.     VELPHORO 500 MG chewable tablet Chew 1,000 mg by mouth 3 (three) times daily.     No current facility-administered medications on file prior to visit.     Review of System: Comprehensive ROS Pertinent positive and negative noted in HPI    Objective:  Blood Pressure 127/82   Pulse 77   Respiration 16   Weight 267 lb 9.6 oz  (121.4 kg)   Oxygen Saturation 98%   Body Mass Index 41.91 kg/m   Filed Weights   11/15/22 1623  Weight: 267 lb 9.6 oz (121.4 kg)    Physical Exam: General Appearance: Well nourished, in no apparent distress. Eyes: PERRLA, EOMs, conjunctiva no swelling or erythema Sinuses: No Frontal/maxillary tenderness ENT/Mouth: Ext aud canals clear, TMs without erythema, bulging.Marland Kitchen Hearing normal.  Neck: Supple, thyroid normal.  Respiratory: Respiratory effort normal, BS equal bilaterally without rales, rhonchi, wheezing or stridor.  Cardio: RRR with no MRGs. Brisk peripheral pulses without edema.  Abdomen: Soft, + BS.  Non tender, no guarding, rebound, hernias, masses. Lymphatics: Non tender without lymphadenopathy.  Musculoskeletal: Full ROM, 5/5 strength, normal gait.  Skin: Warm, dry without rashes, lesions, ecchymosis.  Neuro: Cranial nerves intact. Normal muscle tone, no cerebellar symptoms. Sensation intact.  Psych: Awake and oriented X 3, normal affect, Insight and Judgment appropriate.    Assessment:  [place] was seen today for hospitalization follow-up.  Diagnoses and all orders for this visit:  Generalized anxiety disorder -     ALPRAZolam (XANAX) 0.5 MG tablet; Take 1 tablet (0.5 mg total) by mouth 2 (two) times daily as needed for anxiety.   Hospital discharge follow-up  Signs and symptoms secondary to dialysis This note has been created with Surveyor, quantity. Any transcriptional errors are unintentional.   [name], NP 11/15/2022, 4:24 PM

## 2022-11-16 ENCOUNTER — Telehealth (INDEPENDENT_AMBULATORY_CARE_PROVIDER_SITE_OTHER): Payer: Self-pay | Admitting: Primary Care

## 2022-11-16 NOTE — Telephone Encounter (Signed)
Medication Refill - Medication: ALPRAZolam (XANAX) 0.5 MG   Note In Chart:   Prescribing Status: Transmission to pharmacy failed (11/15/2022  4:43 PM EDT)   Has the patient contacted their pharmacy? Yes.      Patient would like a call, to let her know when the meds have been called in .    Preferred Pharmacy (with phone number or street name):  [facility], [address] [place] [address]  Phone: [phone] Fax: [phone]  Hours: Not open 24 hours    Has the patient been seen for an appointment in the last year OR does the patient have an upcoming appointment? Yes.    Agent: Please be advised that RX refills may take up to 3 business days. We ask that you follow-up with your pharmacy.

## 2022-11-16 NOTE — Telephone Encounter (Signed)
Geophysicist/field seismologist and spoke to Nelson the Pharmacist to call in rx     Name: Alprazolam (Xanax) Strength:0.5mg   Sig: Take 1 tablet (0.5 mg total) by mouth 2 (two) times daily as needed for anxiety.  Quantity: 60 Refills: 0

## 2022-11-16 NOTE — Telephone Encounter (Deleted)
Patient requests to be contacted once the Rx has been sent to her pharmacy.

## 2022-11-16 NOTE — Telephone Encounter (Signed)
Pt requests to be contacted once the Rx has been sent to her pharmacy

## 2022-11-22 ENCOUNTER — Other Ambulatory Visit (INDEPENDENT_AMBULATORY_CARE_PROVIDER_SITE_OTHER): Payer: Self-pay | Admitting: Family Medicine

## 2022-11-24 ENCOUNTER — Other Ambulatory Visit (INDEPENDENT_AMBULATORY_CARE_PROVIDER_SITE_OTHER): Payer: Self-pay | Admitting: Primary Care

## 2022-11-24 NOTE — Telephone Encounter (Signed)
Medication Refill - Medication: colchicine 0.6 MG tablet   Has the patient contacted their pharmacy? Yes.    (Preferred Pharmacy (with phone number or street name):  [id number] - [place], [place] - [address]  [address] [place] [address]  Phone: [phone] Fax: [phone]  Hours: Not open 24 hours     Has the patient been seen for an appointment in the last year OR does the patient have an upcoming appointment? Yes.    Agent: Please be advised that RX refills may take up to 3 business days. We ask that you follow-up with your pharmacy.

## 2022-11-24 NOTE — Telephone Encounter (Signed)
Already ordered 11/23/22.

## 2022-11-27 ENCOUNTER — Other Ambulatory Visit (HOSPITAL_COMMUNITY): Payer: Self-pay | Admitting: Cardiology

## 2022-12-04 NOTE — Progress Notes (Signed)
Advanced Heart Failure Clinic Note    PCP: Grayce Sessions, NP Primary Cardiologist: Jodelle Red, MD  [facility]: Dr. Gala Romney   Reason for Visit: Heart Failure F/u  HPI: [name] is a 67 y.o. with a history of morbid obesity, chronic diastolic and systolic HF with mildly reduced LVEF (45-50%), severe MR/TR, atrial fibrillation on Eliquis, HTN, and DMII.    She has a known history of HF with mildly reduced LVEF of 45-50% with cath in 2019 with mild, non-obstructive CAD. Also with history of difficult to control Afib with failed Tikosyn, later switched to amiodarone, dig, metop and apixaban for [facility].    Admitted 5//22 with worsening HF.  LHC 5/19 non-obstructive CAD. RHC 12/22/20 with elevated filling pressures and low CI.    Underwent MV/TV repair with Maze on 12/30/20. Post-operatively, she struggled  with low output progressive renal failure and volume overload.  Post operative Echo showed LVEF 55% RV moderately HK. Moderate TR. No significant MR. D/c wt was 281 lb. SCr was 2.8. She was also in Afib day of d/c.    Admitted 6/22 for A/CHF and AF diuresed with IV lasix, RHC showed low filling pressures and moderately reduced CO. She underwent successful DCCV 02/01/21. 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    Seen in ED 02/07/21 fShe was back in Afib, rate controlled. She was loaded w amiodarone gtt, successful DCCV on 02/09/21.    Back in AF 7/22. Repeat echo showed new reduction in EF, in setting of AFL with RVR,  EF 30-35%, moderate LVH, moderate RV dysfunction. Transitioned to po amio and torsemide, discharge weight 271 lbs.   S/p DCCV 04/19/21, but back in afib at follow up a week later at [facility] appointment.   Admitted 9/22 with CP and AKI on CKD IV. SCr 3.12 in ED, likely due to over diuresis. discharge, weight 268 lbs.  Admitted 1/23 x 2  with respiratory distress due to HF and COVID, Had recurrent AF. Echo EF 45%  Admitted 4/23 with recurrent HF in setting of  recurrent AF. Echo 35-40% RV moderately down severe TR. RHC 11/24/21 notable for severely elevated biventricular pressures. Started on milrinone and IV lasix.   Underwent DC-CV to NSR on 11/25/21. Loaded with IV amio. Developed severe stuttering. Code Stroke called. CT/MRI normal. Resolved spontaneously. Developed AKI. Discharged home 12/07/21. Scr 2.9. Wt 295  RA = 29 RV = 65/30 PA = 67/37 (45) PCW = 30 (v waves to 45) Fick cardiac output/index = 6.2/2.5 PVR = 2.4 WU Ao sat = 93% PA sat = 50%, 55% PAPi = 1.0   Readmitted 5/23 with a/c CHF after failing escalation of diuretics in outpatient setting. Initially required lasix gtt which was later transitioned to PO torsemide 60 BID + 2.5 mg metolazone M, W, F. TEE during admit with severe biventricular dysfunction, stable MV prosthesis and severe TR. At discharge she was referred to structural heart team for consideration of TV VIV.  Follow up 6/23, back in AFL rate controlled. NYHA IIIb-IV. Arranged for DCCV 02/01/23--> NSR.  Seen in ED 6/23 with a/c HF. Given Lasix 120 mg IV and discharged home. Re-admitted w/ a/c HF in setting of recurrent AF. TEE showed LVEF 55%, severe RV dysfunction. RHC showed markedly elevated biventricular pressures, low PAPi, preserved CO by Fick. Repeat RHC after diuresis showed mildly pulmonary venous hypertension, mildly elevated RA pressure, normal PCWP, preserved CO. Seen by structural heart team and not candidate for TV valve in ring due to markedly  elevated wedge pressures. She was discharged home, 267 lbs.  Started on HD during 7/23 admission. S/p L brachiocephalic fistula 1/61/09. Seen in ED for CP 09/03/22 and 09/14/22. ECG showed rate controlled AF. AF clinic follow up 3/24 remained in SR and continued on amiodarone.   Follow up with [facility] 3/24 and planning re-do fistula, now dialyzing thru [facility].   Today she returns for HF follow up for pre-operative surgical clearance. We have not seen her since 6/23. Overall feeling  terrible, now on iHD and requires oxygen at dialysis. Having low BPs at dialysis requiring midodrine. She has occasional CP at dialysis. She makes a little urine, she takes torsemide on non-HD days. Noticed blood on tissue after BM recently. She has SOB with all ADLs and with minimal activity. Feels very fatigued. She feels palpitations. Denies PND/Orthopnea. Appetite ok. No fever or chills. Weight at home 270 pounds. Taking all medications.   Review of systems complete and found to be negative unless listed in HPI.    Cardiac Studies - Echo (11/13): EF 60-65%, moderate TR - Echo (11/14): EF 60-65%, severe TR, PA peak pressure 47 mmHg - Echo (5/15): EF 50-55% - Exercise stress test (7/6): A. Fib. with controlled vent. response OLD inferior MI and non sp. T wave changes at baseline. - Echo (8/17): EF 50-55%, moderate MR - Exercise stress test (8/17): Atrial Fib. Q wave in lead 3 and AVF with nonsp T wave changes at baseline - Echo (5/19): EF 55-60%, moderate MR - Exercise stress test (5/19): Afib, normal. - LHC (5/19): Prox LAD lesion is 10% stenosed, Dist LM to Ost LAD lesion is 10% stenosed, Ost Cx lesion is 15% stenosed, LV systolic function normal, LVEDP is normal. - Echo (11/19): EF 45-50%, severe MR/TR - Echo (11/21): EF 45-50%, severe MR (rheumatic)/TR - Echo (4/22): EF 45-50%, severe MR mean gradient 2.0 mmHg, severe TR - Echo (5/22): EF 60-65%, s/p MVR, trace MR mean gradient 7.0 mmHg, moderate TR - RHC (5/22): Elevated R > L heart filling pressures with PAPi low but not markedly so (1.8) suggestive of a significant component of RV dysfunction. Prominent v-waves in PCWP and RA pressure tracings suggestive of significant MR and TR. Cardiac index low. - RHC (6/22): low filling pressures, moderate reduced CO. RA 5, PA 35/13 (22), PCWP 13, CO/CI (Fick) 4.3/2.0, PVR 2.1 WU - Echo (7/22): EF 30-35%, moderate LVH, moderate RV dysfunction, PASP 48, s/p MV repair with mild MR and mean gradient 10  mmHg with HR around 130, s/p TV repair with mild-moderate TR, dilated IVC. - RHC (12/22): RA 5, PA 40/11 (26), PCWP 13, CO/CI (Fick) 4.3/1.9, PVR 3.0 WU Normal filling pressures with mild PAH, moderately reduced CP likely due in part to low fluid status - Echo (4/23): EF 35-40%, moderately decreased LV function, moderately decreased RV function, mild MS, mitral valve gradient 8.2 mmHg, s/p MV repair with trivial MR and mean gradient 8.2 mmHg, s/p TV repair with severe TR. - RHC (4/23): severe elevated BiV pressures, moderate pulmonary venous HTN with marked v waves in PCWP tracing, severe RV failure; RA 29, PA 67/37 (45), PCWP 30 (v waves to 45), CO/CI (Fick) 6.2/2.5, PVR 2.4 WU, PAPi 1 - RHC (6/23): markedly elevated BiV pressures, low PAPi suggestive of RV dysfunction, PA sats low  but FICK CO maintained; RA 27, PA 60/27 (41), PCWP 25 (v=35), CO/CI (Fick) 6.8/2.8, PVR 2.4 WU - RHC (7/23): mildly elevated RA, normal PCWP, mild pulmonary venous hypertension, preserved CO; RA mean  11, PA 48/15 (mean 28), PCWP mean 14, CO/CI (Fick) 7.05/3.05, PVR 1.99 WU, PAPi 3 - Ltd Echo (7/23): EF 50-55%, RV normal      - DCCV (02/01/21): successful conversion to NSR. - DCCV (02/09/21): successful conversion to NSR. - DCCV (04/19/21): successful conversion to NSR.  - DCCV (01/31/2022): successful conversion to NSR.  ROS: All systems negative except as listed in HPI, PMH and Problem List.  SH:  Social History   Socioeconomic History   Marital status: Widowed    Spouse name: Not on file   Number of children: 2   Years of education: Not on file   Highest education level: Some college, no degree  Occupational History   Not on file  Tobacco Use   Smoking status: Never    Passive exposure: Never   Smokeless tobacco: Never   Tobacco comments:    Never smoke 10/20/22  Vaping Use   Vaping Use: Never used  Substance and Sexual Activity   Alcohol use: No   Drug use: No   Sexual activity: Not Currently   Other Topics Concern   Not on file  Social History Narrative   Pt lives in [place] with spouse.  2 grown children.   Previously worked in [employer] at [employer].  Now on disability   Social Determinants of Health   Financial Resource Strain: Low Risk  (03/31/2022)   Overall Financial Resource Strain (CARDIA)    Difficulty of Paying Living Expenses: Not hard at all  Food Insecurity: No Food Insecurity (03/31/2022)   Hunger Vital Sign    Worried About Running Out of Food in the Last Year: Never true    Ran Out of Food in the Last Year: Never true  Transportation Needs: No Transportation Needs (03/31/2022)   PRAPARE - Administrator, Civil Service (Medical): No    Lack of Transportation (Non-Medical): No  Physical Activity: Sufficiently Active (03/31/2022)   Exercise Vital Sign    Days of Exercise per Week: 4 days    Minutes of Exercise per Session: 40 min  Stress: Stress Concern Present (03/31/2022)   [employer] of Occupational Health - Occupational Stress Questionnaire    Feeling of Stress : Very much  Social Connections: Socially Isolated (03/31/2022)   Social Connection and Isolation Panel [NHANES]    Frequency of Communication with Friends and Family: More than three times a week    Frequency of Social Gatherings with Friends and Family: More than three times a week    Attends Religious Services: Never    Database administrator or Organizations: No    Attends Banker Meetings: Never    Marital Status: Widowed  Intimate Partner Violence: Not At Risk (03/31/2022)   Humiliation, Afraid, Rape, and Kick questionnaire    Fear of Current or Ex-Partner: No    Emotionally Abused: No    Physically Abused: No    Sexually Abused: No   FH:  Family History  Problem Relation Age of Onset   Breast cancer Mother    Cancer Mother    Hypertension Mother    Cancer Father    Hypertension Sister    Hypertension Brother    CAD Other    Past Medical History:   Diagnosis Date   Acute on chronic systolic CHF (congestive heart failure) (HCC) 12/21/2020   Allergic rhinitis    Arthritis    Asthma    Chronic diastolic CHF (congestive heart failure) (HCC)    COPD (chronic  obstructive pulmonary disease) (HCC)    Depression    DM (diabetes mellitus) (HCC)    DVT (deep vein thrombosis) in pregnancy    GERD (gastroesophageal reflux disease)    HTN (hypertension)    Hyperlipidemia    Obesity    Pneumonia 04/2018   RIGHT LOBE   Sleep apnea    compliant with CPAP   Current Outpatient Medications  Medication Sig Dispense Refill   acetaminophen (TYLENOL) 325 MG tablet Take 2 tablets (650 mg total) by mouth every 6 (six) hours as needed for mild pain or moderate pain.     albuterol (PROVENTIL HFA) 108 (90 Base) MCG/ACT inhaler Inhale 1-2 puffs into the lungs every 6 (six) hours as needed for wheezing or shortness of breath. 18 g 2   ALPRAZolam (XANAX) 0.5 MG tablet Take 1 tablet (0.5 mg total) by mouth 2 (two) times daily as needed for anxiety. 60 tablet 0   amiodarone (PACERONE) 200 MG tablet TAKE 1 TABLET BY MOUTH TWICE A DAY 180 tablet 3   benzonatate (TESSALON PERLES) 100 MG capsule Take 1 capsule (100 mg total) by mouth 3 (three) times daily as needed for cough. 20 capsule 0   Blood Glucose Monitoring Suppl (ACCU-CHEK GUIDE) w/Device KIT Use to check blood sugar TID. Dx: E11.69 1 kit 0   busPIRone (BUSPAR) 5 MG tablet TAKE ONE TABLET BY MOUTH TWICE DAILY @ 9AM & 5PM 180 tablet 11   colchicine 0.6 MG tablet Take 1/2 (one-half) tablet by mouth once daily 45 tablet 0   Dextromethorphan HBr (TUSSIN COUGH) 15 MG CAPS Take 1 capsule (15 mg total) by mouth 2 (two) times daily as needed. 28 capsule 0   diphenhydrAMINE (BENADRYL) 25 mg capsule Take 25 mg by mouth every 6 (six) hours as needed for allergies or itching.     Dulaglutide (TRULICITY) 1.5 MG/0.5ML SOPN Inject 1.5 mg into the skin once a week. 2 mL 2   DULoxetine (CYMBALTA) 60 MG capsule Take 1  capsule (60 mg total) by mouth daily. 180 capsule 1   ELIQUIS 5 MG TABS tablet TAKE 1 TABLET BY MOUTH TWICE A DAY 180 tablet 1   EPINEPHrine 0.3 mg/0.3 mL IJ SOAJ injection Inject 0.3 mg into the muscle as needed for anaphylaxis. 1 each 1   fluticasone (FLONASE) 50 MCG/ACT nasal spray Place 2 sprays into both nostrils daily as needed for allergies or rhinitis. 16 g 3   glucose blood (ACCU-CHEK GUIDE) test strip USE TO CHECK BLOOD SUGAR 3 TIMES DAILY 100 strip 3   guaiFENesin 200 MG tablet Take 1 tablet (200 mg total) by mouth every 4 (four) hours as needed for cough or to loosen phlegm. 30 suppository 0   insulin lispro (HUMALOG KWIKPEN) 100 UNIT/ML KwikPen Inject 10 Units into the skin 3 (three) times daily. 15 mL 3   lidocaine-prilocaine (EMLA) cream Apply 1 Application topically Every Tuesday,Thursday,and Saturday with dialysis.     linaclotide (LINZESS) 72 MCG capsule TAKE 1 CAPSULE (72 MCG TOTAL) BY MOUTH DAILY BEFORE BREAKFAST. IF BM NOT ACHIEVE WITH 1 CAPSULE SHE MAY TAKE 2 DAILY. STOP IF DIARRHEA OCCURS (Patient taking differently: Take 72 mcg by mouth once a week.  STOP IF DIARRHEA OCCURS) 60 capsule 3   loratadine (CLARITIN) 10 MG tablet TAKE ONE TABLET BY MOUTH DAILY AT 9AM 30 tablet 11   metoprolol succinate (TOPROL-XL) 25 MG 24 hr tablet TAKE 1 TABLET BY MOUTH IN THE MORNING AND AT BEDTIME 180 tablet 1   midodrine (  PROAMATINE) 10 MG tablet Take 10 mg by mouth Every Tuesday,Thursday,and Saturday with dialysis. 30 min before Dialysis     Multiple Vitamins-Minerals (MULTIVITAMIN WOMEN PO) Take 1 tablet by mouth daily.     multivitamin (RENA-VIT) TABS tablet Take 1 tablet by mouth daily.     nitroGLYCERIN (NITROSTAT) 0.4 MG SL tablet Place 1 tablet (0.4 mg total) under the tongue every 5 (five) minutes x 3 doses as needed for chest pain. 25 tablet 12   ondansetron (ZOFRAN) 4 MG tablet TAKE 1 TABLET BY MOUTH EVERY 8 HOURS AS NEEDED FOR NAUSEA FOR VOMITING 30 tablet 0   pantoprazole (PROTONIX)  40 MG tablet TAKE ONE TABLET BY MOUTH DAILY AT 9AM 90 tablet 11   polyethylene glycol powder (GLYCOLAX/MIRALAX) 17 GM/SCOOP powder Take 17 g by mouth daily. (Patient taking differently: Take 17 g by mouth daily as needed for mild constipation.) 238 g 0   pregabalin (LYRICA) 100 MG capsule Take 1 capsule (100 mg total) by mouth 3 (three) times daily as needed (for pain). 90 capsule 2   RESTASIS 0.05 % ophthalmic emulsion Place 1 drop into both eyes in the morning and at bedtime.  12   rosuvastatin (CRESTOR) 10 MG tablet TAKE ONE TABLET BY MOUTH DAILY AT 5PM 90 tablet 0   senna-docusate (SENOKOT-S) 8.6-50 MG tablet Take 1 tablet by mouth 2 (two) times daily. 14 tablet 0   torsemide (DEMADEX) 100 MG tablet Take 100 mg by mouth See admin instructions.  Sunday,Monday, Wednesday and Friday     traMADol (ULTRAM) 50 MG tablet Take 50 mg by mouth every 6 (six) hours as needed.     VELPHORO 500 MG chewable tablet Chew 1,000 mg by mouth 3 (three) times daily.     isosorbide mononitrate (IMDUR) 30 MG 24 hr tablet Take 0.5 tablets (15 mg total) by mouth daily. 15 tablet 0   No current facility-administered medications for this encounter.   BP 90/70   Pulse (!) 127   Wt 125.6 kg (277 lb)   SpO2 96%   BMI 43.38 kg/m   Wt Readings from Last 3 Encounters:  12/06/22 125.6 kg (277 lb)  11/15/22 121.4 kg (267 lb 9.6 oz)  11/07/22 124.7 kg (275 lb)   PHYSICAL EXAM: General:  NAD. No resp difficulty, arrived in [facility], fatigued appearing HEENT: Normal Neck: Supple. No JVD. Carotids 2+ bilat; no bruits. No lymphadenopathy or thryomegaly appreciated. Cor: PMI nondisplaced. Irregular rate & rhythm. No rubs, gallops, 2/6 TR Lungs: Clear, diminished in bases.  Abdomen: Soft, obese, nontender, nondistended. No hepatosplenomegaly. No bruits or masses. Good bowel sounds. Extremities: No cyanosis, clubbing, rash, BLE lymphedema Neuro: Alert & oriented x 3, cranial nerves grossly intact. Moves all 4 extremities w/o  difficulty. Affect pleasant.  ECG (personally reviewed): AF 100 bpm  ASSESSMENT & PLAN:  1. Chronic Biventricular Systolic Heart Failure: - RHC (6/22) with low filling pressures with moderately reduced CO. - Echo (7/22) in setting of AFL with RVR, showed EF 30-35%, moderate LVH, moderate RV dysfunction, PASP 48, s/p MV repair with mild MR and mean gradient 10 mmHg with HR around 130, s/p TV repair with mild-moderate TR, dilated IVC.   - RHC (4/23): markedly elevated biventricular pressures - Echo (4/23): EF 35-40% RV moderately down severe TR. Moderate MS mean grad 8mm HG - TEE (6/23): Normal LV EF 55%, severe RV dysfunction, trivial MR, mean gradient 3 across MV prosthesis, severe TR - Multiple admissions recently for HF mostly occur in setting of  recurrent AF.  - RHC (6/23) markedly elevated biventricular pressures, low PAPi, PA sats low but preserved Fick CO. - Repeat RHC (7/23): after diuresis - mild pulmonary venous hypertension, mildly elevated RA pressure, normal PCWP, preserved CO - Ltd Echo (7/23): EF 50-55%, RV normal  - NYHA IIIb chronically, volume per HD - Cntinue Toprol 25 mg bid, hold before HD - Continue midodrine 10 mg T/TH/Sat - Continue torsemide, per nephrology   2. PAF/AFL - She has failed Tikosyn. - She had Maze in 5/22 and DCCV in 6/22 & 04/19/21, last of which she had ERAF. - Multiple admissions recently for HF, mostly occur in setting of recurrent AF/AFL.  - Last DCCV 7/23  - Does not tolerate tolerate AF at all. Follows with EP. Not a candidate for Afib ablation or AV nodal ablation/PPM - In and out of rhythm. In AF today, HR 100 - Continue amiodarone  200 mg bid - Continue Eliquis 5 mg bid - Discussed with Dr. Gala Romney, will not reattempt DCCV. - Has OSA, awaiting split night study for CPAP titration.  - Refer back to Dr. Mayford Knife    3. ESRD  - Dialysis started 02/2022. - T/TH/Sat schedule - She has stenosis in L AVF, Vascular planning new access & needs  cardiac clearance (see below) - Currently dialyzing thru [facility] in chest    4. CAD - Cath (2019): Mild nonobstructive CAD - No ischemic chest pain.  - Continue statin + beta blocker - no ASA w/ Eliquis    5. Valvular Heart Disease - S/p MV and TV repair in 5/22.  - Echo 02/25/21 with mild-moderate TR.   - Echo 4/23 35-40% RV moderately down. severe TR. Moderate MS mean gradient  - TEE 6/23: LV EF 55% with severe RV dysfunction, trivial MR, mean gradient 3 across MV prosthesis, severe TR - Seen by structural heart team. Has been turned down for TV valve in ring due to markedly elevated wedge pressures despite diuresis.   6. Obesity - Body mass index is 43.38 kg/m. - Discussed portion control.    7. DMII - Hgb A1c 6.0% 10/23 - GFR has been too low for SGLT2i   8. OSA - initial sleep study positive but was awaiting split night study to titrate CPAP - Suspect this is part of her AF recurrence - Has been referred to Dr. Mayford Knife   9. PVCs - Continue amiodarone    10. Anemia - Most recent Hgb 11.7 - Has received Feraheme in past admission   11. Pre-operative cardiac clearance - She needs vascular access for dialysis. - She is at least moderate risk for her procedure from a cardiac standpoint, but not prohibitive. Discussed with Dr. Gala Romney - OK to hold Eliquis 2 days before procedure, resume as soon as possible afterwards.  Follow up in 3-4 months with Dr. Gala Romney.  Prince Rome, FNP-BC 12/08/22

## 2022-12-05 ENCOUNTER — Encounter (HOSPITAL_COMMUNITY): Payer: 59

## 2022-12-05 ENCOUNTER — Other Ambulatory Visit (HOSPITAL_COMMUNITY): Payer: Self-pay | Admitting: Physician Assistant

## 2022-12-05 ENCOUNTER — Telehealth (HOSPITAL_COMMUNITY): Payer: Self-pay

## 2022-12-05 DIAGNOSIS — E1169 Type 2 diabetes mellitus with other specified complication: Secondary | ICD-10-CM

## 2022-12-05 NOTE — Telephone Encounter (Signed)
Called to confirm/remind patient of their appointment at the Advanced [facility] Failure Clinic on 12/06/22.   Patient reminded to bring all medications and/or complete list.  Confirmed patient has transportation. Gave directions, instructed to utilize valet parking.  Confirmed appointment prior to ending call.

## 2022-12-06 ENCOUNTER — Ambulatory Visit (HOSPITAL_COMMUNITY)
Admission: RE | Admit: 2022-12-06 | Discharge: 2022-12-06 | Disposition: A | Discharge status: Home / Self Care | Payer: 59 | Source: Ambulatory Visit | Attending: Family Medicine | Admitting: Family Medicine

## 2022-12-06 VITALS — BP 90/70 | HR 100 | Wt 277.0 lb

## 2022-12-06 DIAGNOSIS — Z7901 Long term (current) use of anticoagulants: Secondary | ICD-10-CM | POA: Insufficient documentation

## 2022-12-06 DIAGNOSIS — I480 Paroxysmal atrial fibrillation: Secondary | ICD-10-CM | POA: Diagnosis not present

## 2022-12-06 DIAGNOSIS — Z6841 Body Mass Index (BMI) 40.0 and over, adult: Secondary | ICD-10-CM | POA: Insufficient documentation

## 2022-12-06 DIAGNOSIS — N1832 Chronic kidney disease, stage 3b: Secondary | ICD-10-CM | POA: Diagnosis not present

## 2022-12-06 DIAGNOSIS — Z992 Dependence on renal dialysis: Secondary | ICD-10-CM | POA: Diagnosis not present

## 2022-12-06 DIAGNOSIS — G4733 Obstructive sleep apnea (adult) (pediatric): Secondary | ICD-10-CM

## 2022-12-06 DIAGNOSIS — E119 Type 2 diabetes mellitus without complications: Secondary | ICD-10-CM | POA: Insufficient documentation

## 2022-12-06 DIAGNOSIS — I5042 Chronic combined systolic (congestive) and diastolic (congestive) heart failure: Secondary | ICD-10-CM | POA: Diagnosis not present

## 2022-12-06 DIAGNOSIS — D649 Anemia, unspecified: Secondary | ICD-10-CM | POA: Insufficient documentation

## 2022-12-06 DIAGNOSIS — I5082 Biventricular heart failure: Secondary | ICD-10-CM | POA: Diagnosis not present

## 2022-12-06 DIAGNOSIS — I2510 Atherosclerotic heart disease of native coronary artery without angina pectoris: Secondary | ICD-10-CM | POA: Insufficient documentation

## 2022-12-06 DIAGNOSIS — E669 Obesity, unspecified: Secondary | ICD-10-CM | POA: Diagnosis not present

## 2022-12-06 DIAGNOSIS — Z8249 Family history of ischemic heart disease and other diseases of the circulatory system: Secondary | ICD-10-CM | POA: Diagnosis not present

## 2022-12-06 DIAGNOSIS — Z9889 Other specified postprocedural states: Secondary | ICD-10-CM

## 2022-12-06 DIAGNOSIS — E1122 Type 2 diabetes mellitus with diabetic chronic kidney disease: Secondary | ICD-10-CM

## 2022-12-06 DIAGNOSIS — Z8616 Personal history of COVID-19: Secondary | ICD-10-CM | POA: Diagnosis not present

## 2022-12-06 DIAGNOSIS — I493 Ventricular premature depolarization: Secondary | ICD-10-CM | POA: Insufficient documentation

## 2022-12-06 DIAGNOSIS — I5022 Chronic systolic (congestive) heart failure: Secondary | ICD-10-CM | POA: Insufficient documentation

## 2022-12-06 DIAGNOSIS — Z79899 Other long term (current) drug therapy: Secondary | ICD-10-CM | POA: Diagnosis not present

## 2022-12-06 DIAGNOSIS — I4821 Permanent atrial fibrillation: Secondary | ICD-10-CM | POA: Diagnosis not present

## 2022-12-06 DIAGNOSIS — N186 End stage renal disease: Secondary | ICD-10-CM | POA: Diagnosis not present

## 2022-12-06 DIAGNOSIS — Z794 Long term (current) use of insulin: Secondary | ICD-10-CM

## 2022-12-06 DIAGNOSIS — Z01818 Encounter for other preprocedural examination: Secondary | ICD-10-CM

## 2022-12-06 NOTE — Patient Instructions (Signed)
Thank you for coming in today  Labs were done today, if any labs are abnormal the clinic will call you No news is good news  Medications: No changes   You have been referred to Dr. Norris Cross office for obstructive sleep apnea, their office will call you for further appointment details     Follow up appointments:  Your physician recommends that you schedule a follow-up appointment in:  3 months with Dr. Gala Romney You will receive a reminder letter in the mail a few months in advance. If you don't receive a letter, please call our office to schedule the follow-up appointment.     Do the following things EVERYDAY: Weigh yourself in the morning before breakfast. Write it down and keep it in a log. Take your medicines as prescribed Eat low salt foods--Limit salt (sodium) to 2000 mg per day.  Stay as active as you can everyday Limit all fluids for the day to less than 2 liters   At the [facility], you and your health needs are our priority. As part of our continuing mission to provide you with exceptional heart care, we have created designated Provider Care Teams. These Care Teams include your primary Cardiologist (physician) and Advanced Practice Providers (APPs- Physician Assistants and Nurse Practitioners) who all work together to provide you with the care you need, when you need it.   You may see any of the following providers on your designated Care Team at your next follow up: Dr Arvilla Meres Dr Marca Ancona Dr. Marcos Eke, NP Robbie Lis, [place] [facility] [place], [place] Brynda Peon, NP Karle Plumber, PharmD   Please be sure to bring in all your medications bottles to every appointment.    Thank you for choosing [facility]  If you have any questions or concerns before your next appointment please send [place] a message through [place] or call our office at [phone].    TO LEAVE A  MESSAGE FOR THE NURSE SELECT OPTION 2, PLEASE LEAVE A MESSAGE INCLUDING: YOUR NAME DATE OF BIRTH CALL BACK NUMBER REASON FOR CALL**this is important as we prioritize the call backs  YOU WILL RECEIVE A CALL BACK THE SAME DAY AS LONG AS YOU CALL BEFORE 4:00 PM

## 2022-12-08 ENCOUNTER — Telehealth: Payer: Self-pay

## 2022-12-08 ENCOUNTER — Encounter (HOSPITAL_COMMUNITY): Payer: Self-pay

## 2022-12-08 NOTE — Telephone Encounter (Signed)
Patient cleared by cardiology on 4/17 to proceed with revision of left AVF. Attempted to reach pt to schedule surgery, no answer. Left VM to return call.

## 2022-12-11 ENCOUNTER — Other Ambulatory Visit: Payer: Self-pay

## 2022-12-11 DIAGNOSIS — N186 End stage renal disease: Secondary | ICD-10-CM

## 2022-12-23 ENCOUNTER — Other Ambulatory Visit: Payer: Self-pay

## 2022-12-23 ENCOUNTER — Emergency Department (HOSPITAL_COMMUNITY)
Admission: EM | Admit: 2022-12-23 | Discharge: 2022-12-23 | Disposition: A | Discharge status: Home / Self Care | Payer: 59 | Attending: Emergency Medicine | Admitting: Emergency Medicine

## 2022-12-23 ENCOUNTER — Emergency Department (HOSPITAL_COMMUNITY): Payer: 59

## 2022-12-23 DIAGNOSIS — Z79899 Other long term (current) drug therapy: Secondary | ICD-10-CM | POA: Diagnosis not present

## 2022-12-23 DIAGNOSIS — Z7951 Long term (current) use of inhaled steroids: Secondary | ICD-10-CM | POA: Diagnosis not present

## 2022-12-23 DIAGNOSIS — I132 Hypertensive heart and chronic kidney disease with heart failure and with stage 5 chronic kidney disease, or end stage renal disease: Secondary | ICD-10-CM | POA: Insufficient documentation

## 2022-12-23 DIAGNOSIS — Z794 Long term (current) use of insulin: Secondary | ICD-10-CM | POA: Insufficient documentation

## 2022-12-23 DIAGNOSIS — Z992 Dependence on renal dialysis: Secondary | ICD-10-CM | POA: Diagnosis not present

## 2022-12-23 DIAGNOSIS — I509 Heart failure, unspecified: Secondary | ICD-10-CM | POA: Insufficient documentation

## 2022-12-23 DIAGNOSIS — Z7901 Long term (current) use of anticoagulants: Secondary | ICD-10-CM | POA: Insufficient documentation

## 2022-12-23 DIAGNOSIS — J449 Chronic obstructive pulmonary disease, unspecified: Secondary | ICD-10-CM | POA: Diagnosis not present

## 2022-12-23 DIAGNOSIS — R079 Chest pain, unspecified: Secondary | ICD-10-CM | POA: Diagnosis present

## 2022-12-23 DIAGNOSIS — N186 End stage renal disease: Secondary | ICD-10-CM | POA: Diagnosis not present

## 2022-12-23 DIAGNOSIS — R0789 Other chest pain: Secondary | ICD-10-CM | POA: Diagnosis not present

## 2022-12-23 LAB — BASIC METABOLIC PANEL
Anion gap: 11 (ref 5–15)
BUN: 18 mg/dL (ref 8–23)
CO2: 30 mmol/L (ref 22–32)
Calcium: 8.5 mg/dL — ABNORMAL LOW (ref 8.9–10.3)
Chloride: 96 mmol/L — ABNORMAL LOW (ref 98–111)
Creatinine, Ser: 5.25 mg/dL — ABNORMAL HIGH (ref 0.44–1.00)
GFR, Estimated: 8 mL/min — ABNORMAL LOW (ref >60)
Glucose, Bld: 120 mg/dL — ABNORMAL HIGH (ref 70–99)
Potassium: 3.4 mmol/L — ABNORMAL LOW (ref 3.5–5.1)
Sodium: 137 mmol/L (ref 135–145)

## 2022-12-23 LAB — CBC
HCT: 32.2 % — ABNORMAL LOW (ref 36.0–46.0)
Hemoglobin: 10.6 g/dL — ABNORMAL LOW (ref 12.0–15.0)
MCH: 30.8 pg (ref 26.0–34.0)
MCHC: 32.9 g/dL (ref 30.0–36.0)
MCV: 93.6 fL (ref 80.0–100.0)
Platelets: 170 10*3/uL (ref 150–400)
RBC: 3.44 MIL/uL — ABNORMAL LOW (ref 3.87–5.11)
RDW: 13.6 % (ref 11.5–15.5)
WBC: 4.4 10*3/uL (ref 4.0–10.5)
nRBC: 0 % (ref 0.0–0.2)

## 2022-12-23 LAB — TROPONIN I (HIGH SENSITIVITY)
Troponin I (High Sensitivity): 7 ng/L (ref <18)
Troponin I (High Sensitivity): 8 ng/L (ref <18)

## 2022-12-23 LAB — D-DIMER, QUANTITATIVE: D-Dimer, Quant: 0.4 ug/mL-FEU (ref 0.00–0.50)

## 2022-12-23 MED ORDER — ACETAMINOPHEN 325 MG PO TABS
650.0000 mg | ORAL_TABLET | ORAL | Status: AC
  Administered 2022-12-23: 650 mg via ORAL
  Filled 2022-12-23: qty 2

## 2022-12-23 MED ORDER — HYDROMORPHONE HCL 1 MG/ML IJ SOLN
0.5000 mg | Freq: Once | INTRAMUSCULAR | Status: AC
  Administered 2022-12-23: 0.5 mg via INTRAVENOUS
  Filled 2022-12-23: qty 1

## 2022-12-23 MED ORDER — NITROGLYCERIN 0.4 MG SL SUBL: 0.4000 mg | SUBLINGUAL_TABLET | SUBLINGUAL | Status: DC | PRN

## 2022-12-23 MED ORDER — ONDANSETRON HCL 4 MG/2ML IJ SOLN
4.0000 mg | Freq: Once | INTRAMUSCULAR | Status: AC
  Administered 2022-12-23: 4 mg via INTRAVENOUS
  Filled 2022-12-23: qty 2

## 2022-12-23 MED ORDER — IOHEXOL 350 MG/ML SOLN
100.0000 mL | Freq: Once | INTRAVENOUS | Status: AC | PRN
  Administered 2022-12-23: 100 mL via INTRAVENOUS

## 2022-12-23 MED ORDER — METOCLOPRAMIDE HCL 5 MG/ML IJ SOLN
5.0000 mg | Freq: Once | INTRAMUSCULAR | Status: AC
  Administered 2022-12-23: 5 mg via INTRAVENOUS
  Filled 2022-12-23: qty 2

## 2022-12-23 MED ORDER — METOCLOPRAMIDE HCL 5 MG/ML IJ SOLN: 10.0000 mg | Freq: Once | INTRAMUSCULAR | Status: DC

## 2022-12-23 NOTE — ED Triage Notes (Signed)
[facility] reports pt coming from dialysis and began to have chest pain while there. Pt got 3 hours of dialysis total.

## 2022-12-23 NOTE — ED Provider Notes (Signed)
[place] EMERGENCY DEPARTMENT AT [facility] Provider Note   CSN: [id number] Arrival date & time: 12/23/22  1017     History {Add pertinent medical, surgical, social history, OB history to HPI:1} Chief Complaint  Patient presents with   Chest Pain    [name] is a 67 y.o. female.  67 year old female with a history of mitral valve repair, ESRD on Tuesday, Thursday, Saturday IHD, CHF, COPD, and HTN who presents to the emergency department chest pain.  Patient reports that during her dialysis session she started experiencing chest pain that felt like an elephant sitting on her chest.  Says that it also radiates to her back and up to her neck.  Says that she has had some mild shortness of breath recently as well.  Had 3 of the 4 hours of her dialysis session but is otherwise been compliant.  Denies any diaphoresis or vomiting.  Unsure if the pain is exertional.  Has not yet taken aspirin.        Home Medications Prior to Admission medications   Medication Sig Start Date End Date Taking? Authorizing Provider  acetaminophen (TYLENOL) 325 MG tablet Take 2 tablets (650 mg total) by mouth every 6 (six) hours as needed for mild pain or moderate pain. 12/07/21   Arrien, York Ram, MD  albuterol (PROVENTIL HFA) 108 (90 Base) MCG/ACT inhaler Inhale 1-2 puffs into the lungs every 6 (six) hours as needed for wheezing or shortness of breath. 10/13/22   Grayce Sessions, NP  ALPRAZolam Prudy Feeler) 0.5 MG tablet Take 1 tablet (0.5 mg total) by mouth 2 (two) times daily as needed for anxiety. 11/15/22   Grayce Sessions, NP  amiodarone (PACERONE) 200 MG tablet TAKE 1 TABLET BY MOUTH TWICE A DAY 04/25/22   Bensimhon, Bevelyn Buckles, MD  benzonatate (TESSALON PERLES) 100 MG capsule Take 1 capsule (100 mg total) by mouth 3 (three) times daily as needed for cough. 05/23/22   Grayce Sessions, NP  Blood Glucose Monitoring Suppl (ACCU-CHEK GUIDE) w/Device KIT Use to check blood sugar  TID. Dx: E11.69 06/28/22   Hoy Register, MD  busPIRone (BUSPAR) 5 MG tablet TAKE ONE TABLET BY MOUTH TWICE DAILY @ 9AM & 5PM 09/06/22   Grayce Sessions, NP  colchicine 0.6 MG tablet Take 1/2 (one-half) tablet by mouth once daily 11/23/22   Hoy Register, MD  Dextromethorphan HBr (TUSSIN COUGH) 15 MG CAPS Take 1 capsule (15 mg total) by mouth 2 (two) times daily as needed. 09/28/22   Grayce Sessions, NP  diphenhydrAMINE (BENADRYL) 25 mg capsule Take 25 mg by mouth every 6 (six) hours as needed for allergies or itching.    [provider]  Dulaglutide (TRULICITY) 1.5 MG/0.5ML SOPN Inject 1.5 mg into the skin once a week. 07/05/22   Grayce Sessions, NP  DULoxetine (CYMBALTA) 60 MG capsule Take 1 capsule (60 mg total) by mouth daily. 07/05/22   Grayce Sessions, NP  ELIQUIS 5 MG TABS tablet TAKE 1 TABLET BY MOUTH TWICE A DAY 11/13/22   Jodelle Red, MD  EPINEPHrine 0.3 mg/0.3 mL IJ SOAJ injection Inject 0.3 mg into the muscle as needed for anaphylaxis. 06/25/20   Grayce Sessions, NP  fluticasone (FLONASE) 50 MCG/ACT nasal spray Place 2 sprays into both nostrils daily as needed for allergies or rhinitis. 09/22/22   Grayce Sessions, NP  glucose blood (ACCU-CHEK GUIDE) test strip USE TO CHECK BLOOD SUGAR 3 TIMES DAILY 04/03/22   Newlin,  Enobong, MD  guaiFENesin 200 MG tablet Take 1 tablet (200 mg total) by mouth every 4 (four) hours as needed for cough or to loosen phlegm. 07/05/22   Grayce Sessions, NP  insulin lispro (HUMALOG KWIKPEN) 100 UNIT/ML KwikPen Inject 10 Units into the skin 3 (three) times daily. 07/05/22   Grayce Sessions, NP  isosorbide mononitrate (IMDUR) 30 MG 24 hr tablet Take 0.5 tablets (15 mg total) by mouth daily. 03/18/22 06/20/22  Arrien, York Ram, MD  lidocaine-prilocaine (EMLA) cream Apply 1 Application topically Every Tuesday,Thursday,and Saturday with dialysis. 06/01/22   [provider]  linaclotide (LINZESS) 72 MCG  capsule TAKE 1 CAPSULE (72 MCG TOTAL) BY MOUTH DAILY BEFORE BREAKFAST. IF BM NOT ACHIEVE WITH 1 CAPSULE SHE MAY TAKE 2 DAILY. STOP IF DIARRHEA OCCURS Patient taking differently: Take 72 mcg by mouth once a week.  STOP IF DIARRHEA OCCURS 07/05/22   Grayce Sessions, NP  loratadine (CLARITIN) 10 MG tablet TAKE ONE TABLET BY MOUTH DAILY AT 9AM 09/06/22   Grayce Sessions, NP  metoprolol succinate (TOPROL-XL) 25 MG 24 hr tablet TAKE 1 TABLET BY MOUTH IN THE MORNING AND AT BEDTIME 09/12/22   Lanier Prude, MD  midodrine (PROAMATINE) 10 MG tablet Take 10 mg by mouth Every Tuesday,Thursday,and Saturday with dialysis. 30 min before Dialysis 08/16/22   [provider]  Multiple Vitamins-Minerals (MULTIVITAMIN WOMEN PO) Take 1 tablet by mouth daily.    [provider]  multivitamin (RENA-VIT) TABS tablet Take 1 tablet by mouth daily. 04/27/22   [provider]  nitroGLYCERIN (NITROSTAT) 0.4 MG SL tablet Place 1 tablet (0.4 mg total) under the tongue every 5 (five) minutes x 3 doses as needed for chest pain. 01/08/14   Rinaldo Cloud, MD  ondansetron (ZOFRAN) 4 MG tablet TAKE 1 TABLET BY MOUTH EVERY 8 HOURS AS NEEDED FOR NAUSEA FOR VOMITING 09/28/22   Grayce Sessions, NP  pantoprazole (PROTONIX) 40 MG tablet TAKE ONE TABLET BY MOUTH DAILY AT 9AM 04/17/22   Grayce Sessions, NP  polyethylene glycol powder (GLYCOLAX/MIRALAX) 17 GM/SCOOP powder Take 17 g by mouth daily. Patient taking differently: Take 17 g by mouth daily as needed for mild constipation. 02/25/22   Burnadette Pop, MD  pregabalin (LYRICA) 100 MG capsule Take 1 capsule (100 mg total) by mouth 3 (three) times daily as needed (for pain). 07/05/22   Grayce Sessions, NP  RESTASIS 0.05 % ophthalmic emulsion Place 1 drop into both eyes in the morning and at bedtime. 11/19/17   [provider]  rosuvastatin (CRESTOR) 10 MG tablet TAKE ONE TABLET BY MOUTH DAILY AT 5PM 12/05/22   Bensimhon, Bevelyn Buckles, MD   senna-docusate (SENOKOT-S) 8.6-50 MG tablet Take 1 tablet by mouth 2 (two) times daily. 02/24/22   Burnadette Pop, MD  torsemide (DEMADEX) 100 MG tablet Take 100 mg by mouth See admin instructions.  Sunday,Monday, Wednesday and Friday 04/02/22   [provider]  traMADol (ULTRAM) 50 MG tablet Take 50 mg by mouth every 6 (six) hours as needed. 09/06/22   [provider]  VELPHORO 500 MG chewable tablet Chew 1,000 mg by mouth 3 (three) times daily. 09/15/22   [provider]      Allergies    Bee pollen, Bee venom, and Sulfa antibiotics    Review of Systems   Review of Systems  Physical Exam Updated Vital Signs BP 98/63   Pulse 84   Temp 97.9 F (36.6 C) (Oral)   Resp Marland Kitchen)  21   Ht 5\' 7"  (1.702 m)   Wt 125.2 kg   SpO2 100%   BMI 43.23 kg/m  Physical Exam Vitals and nursing note reviewed.  Constitutional:      General: She is not in acute distress.    Appearance: She is well-developed.  HENT:     Head: Normocephalic and atraumatic.     Right Ear: External ear normal.     Left Ear: External ear normal.     Nose: Nose normal.  Eyes:     Extraocular Movements: Extraocular movements intact.     Conjunctiva/sclera: Conjunctivae normal.     Pupils: Pupils are equal, round, and reactive to light.  Cardiovascular:     Rate and Rhythm: Normal rate and regular rhythm.     Heart sounds: No murmur heard.    Comments: Tunneled catheter in right chest.  Radial pulses 2+ bilaterally. Pulmonary:     Effort: Pulmonary effort is normal. No respiratory distress.     Breath sounds: Normal breath sounds.  Musculoskeletal:     Cervical back: Normal range of motion and neck supple.     Right lower leg: No edema.     Left lower leg: No edema.  Skin:    General: Skin is warm and dry.  Neurological:     Mental Status: She is alert and oriented to person, place, and time. Mental status is at baseline.  Psychiatric:        Mood and Affect: Mood normal.     ED Results  / Procedures / Treatments   Labs (all labs ordered are listed, but only abnormal results are displayed) Labs Reviewed  CBC - Abnormal; Notable for the following components:      Result Value   RBC 3.44 (*)    Hemoglobin 10.6 (*)    HCT 32.2 (*)    All other components within normal limits  D-DIMER, QUANTITATIVE  BASIC METABOLIC PANEL  TROPONIN I (HIGH SENSITIVITY)    EKG None  Radiology DG Chest 2 View  Result Date: 12/23/2022 CLINICAL DATA:  Chest pain. EXAM: CHEST - 2 VIEW COMPARISON:  11/07/2022. FINDINGS: Unchanged right IJ approach dialysis catheter with tip projecting over the right atrium. Stable postoperative changes of median sternotomy, mitral valve annuloplasty and left atrial appendage clipping. Low lung volumes accentuate the pulmonary vasculature and cardiomediastinal silhouette. No consolidation or pulmonary edema. No pleural effusion or pneumothorax. Visualized bones and upper abdomen are unremarkable. IMPRESSION: No evidence of acute cardiopulmonary disease. Electronically Signed   By: Orvan Falconer M.D.   On: 12/23/2022 11:28    Procedures Procedures  {Document cardiac monitor, telemetry assessment procedure when appropriate:1}  Medications Ordered in ED Medications - No data to display  ED Course/ Medical Decision Making/ A&P   {   Click here for ABCD2, HEART and other calculatorsREFRESH Note before signing :1}                          Medical Decision Making Amount and/or Complexity of Data Reviewed Labs: ordered. Radiology: ordered.   ***  {Document critical care time when appropriate:1} {Document review of labs and clinical decision tools ie heart score, Chads2Vasc2 etc:1}  {Document your independent review of radiology images, and any outside records:1} {Document your discussion with family members, caretakers, and with consultants:1} {Document social determinants of health affecting pt's care:1} {Document your decision making why or why not  admission, treatments were needed:1} Final Clinical Impression(s) / ED  Diagnoses Final diagnoses:  None    Rx / DC Orders ED Discharge Orders     None

## 2022-12-23 NOTE — Progress Notes (Addendum)
PIV consult: Long 20g placed R AC due to previous attempt in proximal forearm. No suitable veins found that would avoid previous venipuncture. Forearm veins appear small and deep on [place]. Please consider central line if prolonged venous access is needed.

## 2022-12-23 NOTE — ED Provider Notes (Signed)
3:11 PM Assumed care of patient from off-going team. For more details, please see note from same day.  In brief, this is a 67 y.o. female w/ ESRD on HD who p/w chest pain. Serial trops wnl. HEART score 6.   Plan/Dispo at time of sign-out & ED Course since sign-out: [ ]  CT chest  BP (!) 146/80 (BP Location: Right Arm)   Pulse 93   Temp 97.8 F (36.6 C) (Oral)   Resp 20   Ht 5\' 7"  (1.702 m)   Wt 125.2 kg   SpO2 100%   BMI 43.23 kg/m    ED Course:   Clinical Course as of 12/23/22 1715  Sat Dec 23, 2022  1621 Patient returned from CT very nauseated and vomiting. Already received zofran IV an hour ago. Will give renally-dosed reglan IV. [HN]  1640 CT ANGIO CHEST/ABD/PEL FOR DISSECTION W &/OR WO CONTRAST 1. No evidence of thoracic or abdominal aortic aneurysm or dissection. 2. 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 3. No acute abnormality of the chest, abdomen, or pelvis. 4. Small hiatal hernia. 5. Fat containing umbilical hernia.   [HN]  1705 Patient feeling much improved after Reglan.  This is likely a reaction to the contrast.  She has never had the contrast before.  But she noticed that her symptoms were associated directly with the initiation of the contrast bolus.  Traditionally like to do dialysis within 24 hours of contrast load and will message with Dr. Arlean Hopping. [HN]  [id number] Per Dr. Arlean Hopping, patient okay to go with regularly-scheduled dialysis. She feels improved and is now well-appearing. Patient has cardiology and will get close f/u. Will be DC'd with DC instructions/return precautions.  [HN]    Clinical Course User Index [HN] Loetta Rough, MD    Dispo: Patient with HEART score 5 d/t risk factors, age, and chronic EKG changes. Patient already has cardiology plugged in, she is well-appearing now, CP resolved. D/w patient and she is amenable to o/p f/u .  ------------------------------- Vivi Barrack, MD Emergency  Medicine  This note was created using dictation software, which may contain spelling or grammatical errors.   Loetta Rough, MD 12/23/22 [phone]

## 2022-12-23 NOTE — ED Notes (Signed)
Pt is a&ox4, warm and dry to touch. Pt is complaining of chest pain, headache, and back pain. Pt was 3 hours into her dialysis when symptoms began. Pt denies shob. Pt changed into a gown, attached to monitor/vitals. Side rails up x 2, covered with warm blanket, call light with in reach.

## 2022-12-23 NOTE — Discharge Instructions (Signed)
Thank you for coming to [facility] Emergency Department. You were seen for chest pain during dialysis. We did an exam, labs, and imaging, and these showed no acute findings.  Please follow up with your cardiologist within 1 week.   Do not hesitate to return to the ED or call 911 if you experience: -Worsening symptoms -Chest pain, shortness of breath -Lightheadedness, passing out -Fevers/chills -Anything else that concerns you

## 2022-12-28 NOTE — Progress Notes (Signed)
[name] was seen in ED on 12/23/22 with chest pain. Patient  had a CT cardiac, discharged with instructions to follow up with cardiology.  I reviewed Leslie Maynard's appointments and see that patient has a pre op Cardiac visit 12/29/22 at 0920; patient should be at [facility] Pre- op 12/29/22 at 0800.  I called Dr. Adele Dan office and shared the information with Lawanna Kobus, she said she will speak with Kia and she will follow up with Dr. Edilia Bo.

## 2022-12-29 ENCOUNTER — Ambulatory Visit (INDEPENDENT_AMBULATORY_CARE_PROVIDER_SITE_OTHER): Payer: 59 | Admitting: Cardiology

## 2022-12-29 ENCOUNTER — Encounter (HOSPITAL_BASED_OUTPATIENT_CLINIC_OR_DEPARTMENT_OTHER): Payer: Self-pay | Admitting: *Deleted

## 2022-12-29 ENCOUNTER — Encounter (HOSPITAL_BASED_OUTPATIENT_CLINIC_OR_DEPARTMENT_OTHER): Payer: Self-pay | Admitting: Cardiology

## 2022-12-29 VITALS — BP 96/62 | HR 92 | Ht 67.0 in | Wt 278.8 lb

## 2022-12-29 DIAGNOSIS — Z794 Long term (current) use of insulin: Secondary | ICD-10-CM

## 2022-12-29 DIAGNOSIS — I4811 Longstanding persistent atrial fibrillation: Secondary | ICD-10-CM

## 2022-12-29 DIAGNOSIS — Z01810 Encounter for preprocedural cardiovascular examination: Secondary | ICD-10-CM | POA: Diagnosis not present

## 2022-12-29 DIAGNOSIS — N186 End stage renal disease: Secondary | ICD-10-CM

## 2022-12-29 DIAGNOSIS — Z992 Dependence on renal dialysis: Secondary | ICD-10-CM

## 2022-12-29 DIAGNOSIS — E1122 Type 2 diabetes mellitus with diabetic chronic kidney disease: Secondary | ICD-10-CM

## 2022-12-29 DIAGNOSIS — Z9889 Other specified postprocedural states: Secondary | ICD-10-CM

## 2022-12-29 DIAGNOSIS — I4821 Permanent atrial fibrillation: Secondary | ICD-10-CM

## 2022-12-29 NOTE — Patient Instructions (Signed)
Medication Instructions:  Your physician recommends that you continue on your current medications as directed. Please refer to the Current Medication list given to you today.  *If you need a refill on your cardiac medications before your next appointment, please call your pharmacy*  Lab Work: NONE  Testing/Procedures: NONE  Follow-Up: At [facility], you and your health needs are our priority.  As part of our continuing mission to provide you with exceptional heart care, we have created designated Provider Care Teams.  These Care Teams include your primary Cardiologist (physician) and Advanced Practice Providers (APPs -  Physician Assistants and Nurse Practitioners) who all work together to provide you with the care you need, when you need it.  We recommend signing up for the patient portal called "MyChart".  Sign up information is provided on this After Visit Summary.  MyChart is used to connect with patients for Virtual Visits (Telemedicine).  Patients are able to view lab/test results, encounter notes, upcoming appointments, etc.  Non-urgent messages can be sent to your provider as well.   To learn more about what you can do with MyChart, go to [email or web address].    Your next appointment:   2 month(s)  The format for your next appointment:   In Person  Provider:   Jodelle Red, MD   PRESCRIPTION FOR ROLLING WALKER GIVEN, TRY [facility] IN YOUR SEARCH

## 2022-12-29 NOTE — Progress Notes (Signed)
Cardiology Office Note:    Date:  12/29/2022   ID:  [name], DOB Sep 16, 1955, MRN [id number]  PCP:  Grayce Sessions, NP  Cardiologist:  Jodelle Red, MD  Referring MD: Grayce Sessions, NP   CC: follow up  History of Present Illness:    [name] is a 67 y.o. female with a hx of chronic systolic and diastolic congestive heart failure, hypertension, hyperlipidemia, diabetes mellitus, deep vein thrombosis, COPD, asthma, sleep apnea, obesity, and depression, who is seen for follow-up.   She has had a long and complex cardiac course. She has frequent readmissions for heart failure, chest pain, and shortness of breath. Please see prior notes for details.  At her last appointment, she complained of ongoing shortness of breath and chest pain. Her breathing may have improved slightly since starting dialysis. Chest pain was occurring daily with a sharp, pressure-like quality. Also noted fatigue, productive cough, and decreasing blood pressure. No medication changes were made.  Most recently presented to the ED 12/23/2022 with complaints of chest pain that had started during her dialysis session. Her EKG showed atrial fibrillation at 74 bpm. Serial troponins were within normal limits. D-dimer was normal. CTA was obtained given persistent pain; negative for aneurysm or dissection. She was discharged in stable condition with outpatient follow-up.  Today, she is accompanied by a family member. She also presents for preoperative clearance. She is scheduled for a revision of left arteriovenous fistula on 01/05/2023 with Dr. Edilia Bo, requesting to hold Eliquis for 48 hours prior to the procedure. She notes that her fistula has stopped working appropriately.  Overall, she states she is feeling bad. Since starting dialysis, she experiences gastric upset every time she eats or drinks. She complains of loss of appetite and poor oral intake. She has had medication  prescribed for nausea but it was ineffective. Recently she states that Alprazolam has been helpful.  When she walks, her shortness of breath worsens. She notes that her cane doesn't seem to be helpful, and plans to obtain a rollator. By the time she walks from her car to the waiting area for dialysis, she is worn out. She is given medication for nausea when she arrives at dialysis. Previously her dialysis sessions have been stopped early. After dialysis, she usually goes home to eat and rest.  Additionally she complains of dizziness that can occur at any time. This recently occurred when she returned home after shopping.  In clinic today she is in atrial fibrillation per her EKG.  We also reviewed her recent visit to the ER. Her chest pain was also associated with radiating pain to her head. She states that she was given a pain medication that offered relief.  She denies any palpitations, chest pain, or peripheral edema. No headaches, syncope, orthopnea, or PND.   Past Medical History:  Diagnosis Date   Acute on chronic systolic CHF (congestive heart failure) (HCC) 12/21/2020   Allergic rhinitis    Arthritis    Asthma    Chronic diastolic CHF (congestive heart failure) (HCC)    COPD (chronic obstructive pulmonary disease) (HCC)    Depression    DM (diabetes mellitus) (HCC)    DVT (deep vein thrombosis) in pregnancy    GERD (gastroesophageal reflux disease)    HTN (hypertension)    Hyperlipidemia    Obesity    Pneumonia 04/2018   RIGHT LOBE   Sleep apnea    compliant with CPAP    Past Surgical History:  Procedure Laterality Date   ABDOMINAL HYSTERECTOMY     AV FISTULA PLACEMENT Left 03/17/2022   Procedure: LEFT ARM ARTERIOVENOUS (AV) FISTULA CREATION;  Surgeon: Chuck Hint, MD;  Location: [facility] OR;  Service: Vascular;  Laterality: Left;   BUBBLE STUDY  11/26/2020   Procedure: BUBBLE STUDY;  Surgeon: Jodelle Red, MD;  Location: [facility] ENDOSCOPY;  Service:  Cardiovascular;;   CARDIOVERSION N/A 05/06/2020   Procedure: CARDIOVERSION;  Surgeon: Jodelle Red, MD;  Location: [facility] ENDOSCOPY;  Service: Cardiovascular;  Laterality: N/A;   CARDIOVERSION N/A 11/04/2020   Procedure: CARDIOVERSION;  Surgeon: Lewayne Bunting, MD;  Location: [facility] ENDOSCOPY;  Service: Cardiovascular;  Laterality: N/A;   CARDIOVERSION N/A 02/01/2021   Procedure: CARDIOVERSION;  Surgeon: Dolores Patty, MD;  Location: [facility] ENDOSCOPY;  Service: Cardiovascular;  Laterality: N/A;   CARDIOVERSION N/A 02/09/2021   Procedure: CARDIOVERSION;  Surgeon: Dolores Patty, MD;  Location: [facility] ENDOSCOPY;  Service: Cardiovascular;  Laterality: N/A;   CARDIOVERSION N/A 04/19/2021   Procedure: CARDIOVERSION;  Surgeon: Pricilla Riffle, MD;  Location: [facility] ENDOSCOPY;  Service: Cardiovascular;  Laterality: N/A;   CARDIOVERSION N/A 11/25/2021   Procedure: CARDIOVERSION;  Surgeon: Dolores Patty, MD;  Location: [facility] ENDOSCOPY;  Service: Cardiovascular;  Laterality: N/A;   CARDIOVERSION N/A 01/31/2022   Procedure: CARDIOVERSION;  Surgeon: Dolores Patty, MD;  Location: [facility] ENDOSCOPY;  Service: Cardiovascular;  Laterality: N/A;   CATARACT EXTRACTION, BILATERAL     CHOLECYSTECTOMY     COLONOSCOPY WITH PROPOFOL N/A 03/05/2015   Procedure: COLONOSCOPY WITH PROPOFOL;  Surgeon: Jeani Hawking, MD;  Location: [facility] ENDOSCOPY;  Service: Endoscopy;  Laterality: N/A;   DIAGNOSTIC LAPAROSCOPY     FISTULA SUPERFICIALIZATION Left 03/17/2022   Procedure: FISTULA SUPERFICIALIZATION -LEFT;  Surgeon: Chuck Hint, MD;  Location: [facility] OR;  Service: Vascular;  Laterality: Left;   ingrown hallux Left    IR FLUORO GUIDE CV LINE RIGHT  03/14/2022   IR US GUIDE VASC ACCESS RIGHT  03/14/2022   KNEE SURGERY     LEFT HEART CATH AND CORONARY ANGIOGRAPHY N/A 12/31/2017   Procedure: LEFT HEART CATH AND CORONARY ANGIOGRAPHY;  Surgeon: Rinaldo Cloud, MD;  Location: [facility] INVASIVE CV LAB;  Service: Cardiovascular;   Laterality: N/A;   MAZE N/A 12/29/2020   Procedure: MAZE;  Surgeon: Loreli Slot, MD;  Location: [facility] OR;  Service: Open Heart Surgery;  Laterality: N/A;   MITRAL VALVE REPAIR N/A 12/29/2020   Procedure: MITRAL VALVE REPAIR USING CARBOMEDICS ANNULOFLEX RING SIZE 30;  Surgeon: Loreli Slot, MD;  Location: [facility] OR;  Service: Open Heart Surgery;  Laterality: N/A;   RIGHT HEART CATH N/A 12/22/2020   Procedure: RIGHT HEART CATH;  Surgeon: Laurey Morale, MD;  Location: [facility] INVASIVE CV LAB;  Service: Cardiovascular;  Laterality: N/A;   RIGHT HEART CATH N/A 01/31/2021   Procedure: RIGHT HEART CATH;  Surgeon: Dolores Patty, MD;  Location: [facility] INVASIVE CV LAB;  Service: Cardiovascular;  Laterality: N/A;   RIGHT HEART CATH N/A 07/29/2021   Procedure: RIGHT HEART CATH;  Surgeon: Dolores Patty, MD;  Location: [facility] INVASIVE CV LAB;  Service: Cardiovascular;  Laterality: N/A;   RIGHT HEART CATH N/A 11/24/2021   Procedure: RIGHT HEART CATH;  Surgeon: Dolores Patty, MD;  Location: [facility] INVASIVE CV LAB;  Service: Cardiovascular;  Laterality: N/A;   RIGHT HEART CATH N/A 02/14/2022   Procedure: RIGHT HEART CATH;  Surgeon: Dolores Patty, MD;  Location: [facility] INVASIVE CV LAB;  Service: Cardiovascular;  Laterality:  N/A;   RIGHT HEART CATH N/A 02/22/2022   Procedure: RIGHT HEART CATH;  Surgeon: Laurey Morale, MD;  Location: [facility] INVASIVE CV LAB;  Service: Cardiovascular;  Laterality: N/A;   TEE WITHOUT CARDIOVERSION N/A 11/26/2020   Procedure: TRANSESOPHAGEAL ECHOCARDIOGRAM (TEE);  Surgeon: Jodelle Red, MD;  Location: [facility] ENDOSCOPY;  Service: Cardiovascular;  Laterality: N/A;   TEE WITHOUT CARDIOVERSION N/A 12/29/2020   Procedure: TRANSESOPHAGEAL ECHOCARDIOGRAM (TEE);  Surgeon: Loreli Slot, MD;  Location: [facility] OR;  Service: Open Heart Surgery;  Laterality: N/A;   TEE WITHOUT CARDIOVERSION N/A 01/20/2022   Procedure: TRANSESOPHAGEAL ECHOCARDIOGRAM (TEE);  Surgeon: Dolores Patty,  MD;  Location: [facility] ENDOSCOPY;  Service: Cardiovascular;  Laterality: N/A;   TRICUSPID VALVE REPLACEMENT N/A 12/29/2020   Procedure: 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 RING SIZE 34;  Surgeon: Loreli Slot, MD;  Location: [facility] OR;  Service: Open Heart Surgery;  Laterality: N/A;    Current Medications: Current Outpatient Medications on File Prior to Visit  Medication Sig   acetaminophen (TYLENOL) 325 MG tablet Take 2 tablets (650 mg total) by mouth every 6 (six) hours as needed for mild pain or moderate pain.   albuterol (PROVENTIL HFA) 108 (90 Base) MCG/ACT inhaler Inhale 1-2 puffs into the lungs every 6 (six) hours as needed for wheezing or shortness of breath.   amiodarone (PACERONE) 200 MG tablet TAKE 1 TABLET BY MOUTH TWICE A DAY   busPIRone (BUSPAR) 5 MG tablet TAKE ONE TABLET BY MOUTH TWICE DAILY @ 9AM & 5PM   colchicine 0.6 MG tablet Take 1/2 (one-half) tablet by mouth once daily   diphenhydrAMINE (BENADRYL) 25 mg capsule Take 25 mg by mouth every 6 (six) hours as needed for allergies or itching.   Dulaglutide (TRULICITY) 1.5 MG/0.5ML SOPN Inject 1.5 mg into the skin once a week.   DULoxetine (CYMBALTA) 60 MG capsule Take 1 capsule (60 mg total) by mouth daily.   ELIQUIS 5 MG TABS tablet TAKE 1 TABLET BY MOUTH TWICE A DAY   EPINEPHrine 0.3 mg/0.3 mL IJ SOAJ injection Inject 0.3 mg into the muscle as needed for anaphylaxis.   fluticasone (FLONASE) 50 MCG/ACT nasal spray Place 2 sprays into both nostrils daily as needed for allergies or rhinitis.   insulin lispro (HUMALOG KWIKPEN) 100 UNIT/ML KwikPen Inject 10 Units into the skin 3 (three) times daily.   isosorbide mononitrate (IMDUR) 30 MG 24 hr tablet Take 0.5 tablets (15 mg total) by mouth daily.   lidocaine-prilocaine (EMLA) cream Apply 1 Application topically Every Tuesday,Thursday,and Saturday with dialysis.   linaclotide (LINZESS) 72 MCG capsule TAKE 1 CAPSULE (72 MCG TOTAL) BY MOUTH DAILY BEFORE BREAKFAST. IF BM  NOT ACHIEVE WITH 1 CAPSULE SHE MAY TAKE 2 DAILY. STOP IF DIARRHEA OCCURS (Patient taking differently: Take 72 mcg by mouth once a week.  STOP IF DIARRHEA OCCURS)   loratadine (CLARITIN) 10 MG tablet TAKE ONE TABLET BY MOUTH DAILY AT 9AM   metoprolol succinate (TOPROL-XL) 25 MG 24 hr tablet TAKE 1 TABLET BY MOUTH IN THE MORNING AND AT BEDTIME   midodrine (PROAMATINE) 10 MG tablet Take 10 mg by mouth Every Tuesday,Thursday,and Saturday with dialysis. 30 min before Dialysis   Multiple Vitamins-Minerals (MULTIVITAMIN WOMEN PO) Take 1 tablet by mouth daily.   multivitamin (RENA-VIT) TABS tablet Take 1 tablet by mouth daily.   nitroGLYCERIN (NITROSTAT) 0.4 MG SL tablet Place 1 tablet (0.4 mg total) under the tongue every 5 (five) minutes x 3 doses as needed for chest pain.  ondansetron (ZOFRAN) 4 MG tablet TAKE 1 TABLET BY MOUTH EVERY 8 HOURS AS NEEDED FOR NAUSEA FOR VOMITING   pantoprazole (PROTONIX) 40 MG tablet TAKE ONE TABLET BY MOUTH DAILY AT 9AM   polyethylene glycol powder (GLYCOLAX/MIRALAX) 17 GM/SCOOP powder Take 17 g by mouth daily. (Patient taking differently: Take 17 g by mouth daily as needed for mild constipation.)   pregabalin (LYRICA) 100 MG capsule Take 1 capsule (100 mg total) by mouth 3 (three) times daily as needed (for pain).   RESTASIS 0.05 % ophthalmic emulsion Place 1 drop into both eyes in the morning and at bedtime.   rosuvastatin (CRESTOR) 10 MG tablet TAKE ONE TABLET BY MOUTH DAILY AT 5PM   senna-docusate (SENOKOT-S) 8.6-50 MG tablet Take 1 tablet by mouth 2 (two) times daily.   torsemide (DEMADEX) 100 MG tablet Take 100 mg by mouth See admin instructions.  Sunday,Monday, Wednesday and Friday   VELPHORO 500 MG chewable tablet Chew 1,000 mg by mouth 3 (three) times daily.   ALPRAZolam (XANAX) 0.5 MG tablet Take 1 tablet (0.5 mg total) by mouth 2 (two) times daily as needed for anxiety. (Patient not taking: Reported on 12/29/2022)   Blood Glucose Monitoring Suppl (ACCU-CHEK  GUIDE) w/Device KIT Use to check blood sugar TID. Dx: E11.69   glucose blood (ACCU-CHEK GUIDE) test strip USE TO CHECK BLOOD SUGAR 3 TIMES DAILY   No current facility-administered medications on file prior to visit.     Allergies:   Bee pollen, Bee venom, Sulfa antibiotics, and Iodinated contrast media   Social History   Tobacco Use   Smoking status: Never    Passive exposure: Never   Smokeless tobacco: Never   Tobacco comments:    Never smoke 10/20/22  Vaping Use   Vaping Use: Never used  Substance Use Topics   Alcohol use: No   Drug use: No    Family History: family history includes Breast cancer in her mother; CAD in an other family member; Cancer in her father and mother; Hypertension in her brother, mother, and sister.  ROS:   Please see the history of present illness.   (+) Gastric upset (+) Loss of appetite (+) Nausea (+) Shortness of breath (+) Dizziness Additional pertinent ROS otherwise unremarkable.    EKGs/Labs/Other Studies Reviewed:    The following studies were reviewed today:  CTA Chest/Abdomen/Pelvis  12/23/2022: IMPRESSION: 1. No evidence of thoracic or abdominal aortic aneurysm or dissection. 2. 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 3. No acute abnormality of the chest, abdomen, or pelvis. 4. Small hiatal hernia. 5. Fat containing umbilical hernia.  TTE  06/09/2022  ([facility]): SUMMARY  The left ventricular size is normal.  Mild left ventricular hypertrophy  Left ventricular systolic function is normal.  LV ejection fraction = 55-60%.  Systolic septal flattening consistent with RV pressure overload.  The right ventricle is moderately dilated.  The right ventricular systolic function is moderately reduced.  The left atrium is mildly dilated.  The right atrium is moderately dilated.  S-p Mitral valve replacement  The mean gradient across the mitral valve is 13 mmHg. The heart rate  for the  mean mitral valve gradient is 82 BPM.  There is mild to moderate tricuspid regurgitation.  Moderate pulmonary hypertension.  Mild pulmonic valvular regurgitation.  The aortic sinus is normal size.  Dilated IVC consistent with elevated RA pressure.  There is no pericardial effusion.  There is no comparison study available.  Echo 03/13/2022: 1. Abnormal septal motion . Left ventricular ejection fraction, by  estimation, is 50 to 55%. The left ventricle has low normal function. The  left ventricle has no regional wall motion abnormalities.   2. Right ventricular systolic function is normal. The right ventricular  size is normal.   3. Left atrial size was severely dilated.   4. Right atrial size was severely dilated.   5. Post repair with annuloplasty ring no doppler/color flow done. The  mitral valve was not assessed. No evidence of mitral valve regurgitation.  No evidence of mitral stenosis.   6. Post repair with ring no doppler/color flow done. Tricuspid valve  regurgitation is not demonstrated.   7. The aortic valve was not assessed. Aortic valve regurgitation is not  visualized. No aortic stenosis is present.   8. The inferior vena cava is normal in size with greater than 50%  respiratory variability, suggesting right atrial pressure of 3 mmHg.    Bilateral Carotid Duplex 03/09/2022: Summary:  Right Carotid: Velocities in the right ICA are consistent with a 1-39%  stenosis.   Left Carotid: Velocities in the left ICA are consistent with a 1-39%  stenosis.   Vertebrals:  Bilateral vertebral arteries demonstrate antegrade flow.  Subclavians: Normal flow hemodynamics were seen in bilateral subclavian arteries.    Right Heart Cath 02/22/2022: 1. Normal PCWP 2. Mild pulmonary venous hypertension.  3. Mildly elevated RA pressure.  4. Preserved cardiac output.    EKG:  EKG is personally reviewed.   12/29/2022:  atrial fibrillation 05/01/22: Sinus rhythm. Rate 83 bpm.  PAC, iRBBB,  PRWP  Recent Labs: 03/12/2022: Magnesium 2.0 10/23/2022: B Natriuretic Peptide 362.2 11/07/2022: ALT 21 12/23/2022: BUN 18; Creatinine, Ser 5.25; Hemoglobin 10.6; Platelets 170; Potassium 3.4; Sodium 137   Recent Lipid Panel    Component Value Date/Time   CHOL 158 05/03/2021 0603   CHOL 140 06/24/2020 1054   TRIG 83 05/03/2021 0603   HDL 50 05/03/2021 0603   HDL 46 06/24/2020 1054   CHOLHDL 3.2 05/03/2021 0603   VLDL 17 05/03/2021 0603   LDLCALC 91 05/03/2021 0603   LDLCALC 75 06/24/2020 1054    Physical Exam:    VS:  BP 96/62 (BP Location: Right Arm, Patient Position: Sitting, Cuff Size: Large)   Pulse 92   Ht 5\' 7"  (1.702 m)   Wt 278 lb 12.8 oz (126.5 kg)   BMI 43.67 kg/m     Wt Readings from Last 3 Encounters:  12/29/22 278 lb 12.8 oz (126.5 kg)  12/23/22 276 lb (125.2 kg)  12/06/22 277 lb (125.6 kg)    GEN: Well nourished, well developed in no acute distress HEENT: Normal, moist mucous membranes NECK: No JVD CARDIAC: irregularly irregular rhythm, normal S1 and S2, no rubs or gallops. No murmur. VASCULAR: Radial and DP pulses 2+ bilaterally. No carotid bruits RESPIRATORY:  Clear to auscultation without rales, wheezing or rhonchi  ABDOMEN: Soft, non-tender, non-distended MUSCULOSKELETAL:  Ambulates independently SKIN: Warm and dry, no edema NEUROLOGIC:  Alert and oriented x 3. No focal neuro deficits noted. PSYCHIATRIC:  Normal affect     ASSESSMENT:    1. Preop cardiovascular exam   2. Longstanding persistent atrial fibrillation (HCC)   3. ESRD (end stage renal disease) on dialysis (HCC)   4. H/O mitral valve repair   5. History of tricuspid valve repair   6. Type 2 diabetes mellitus with chronic kidney disease on chronic dialysis, with long-term current use of insulin (HCC)  PLAN:    Preop cardiovascular exam -was cleared on 12/06/22 by heart failure team -agree that she is moderate risk given her multiple cardiovascular comorbidities, but she is  optimized and there is no modifiable risk at this time. -does have chronic issues with mobility, will get rolling walker to encourage ambulation  Atypical chest pain -not exertional, random -prior workup unremarkable -not likely cardiac in origin  atrial fibrillation, persistent Atypical atrial flutter -CHA2DS2/VAS Stroke Risk Points=4, continue DOAC -has tried tikosyn, had MAZE and multiple cardioversions -continue metoprolol, amiodarone.  -post fistula, need to discuss repeat cardioversion vs. Stopping amiodarone vs. Hybrid ablation vs any other options   S/P MVR and TVR, with post op mitral stenosis chronic systolic and diastolic heart failure, last EF improved Nonischemic cardiomyopathy Nonobstructive CAD Type II diabetes -following closely with heart failure clinic -NYHA 3-4 -last EF improved to 50-55%, prior 35-40% -now on dialysis -on metoprolol succinate, trulicity -no aspirin when on DOAC -continue statin   History of obstructive sleep apnea, with CPAP at home: -previously ordered repeat sleep study, not completed   Strong family history of heart disease/heart failure: she is unclear of the etiology of this   Cardiac risk counseling and prevention recommendations: -recommend heart healthy/Mediterranean diet, with whole grains, fruits, vegetable, fish, lean meats, nuts, and olive oil. Limit salt. -recommend moderate walking, 3-5 times/week for 30-50 minutes each session. Aim for at least 150 minutes.week. Goal should be pace of 3 miles/hours, or walking 1.5 miles in 30 minutes -recommend avoidance of tobacco products. Avoid excess alcohol. -ASCVD risk score: The 10-year ASCVD risk score (Arnett DK, et al., 2019) is: 9.6%   Values used to calculate the score:     Age: 71 years     Sex: Female     Is Non-Hispanic African American: Yes     Diabetic: Yes     Tobacco smoker: No     Systolic Blood Pressure: 96 mmHg     Is BP treated: Yes     HDL Cholesterol: 50  mg/dL     Total Cholesterol: 158 mg/dL    Plan for follow up: 2 months, or sooner as needed.  Jodelle Red, MD, PhD, [facility]   [facility]    Medication Adjustments/Labs and Tests Ordered: Current medicines are reviewed at length with the patient today.  Concerns regarding medicines are outlined above.   Orders Placed This Encounter  Procedures   Walker rolling   EKG 12-Lead   No orders of the defined types were placed in this encounter.  Patient Instructions  Medication Instructions:  Your physician recommends that you continue on your current medications as directed. Please refer to the Current Medication list given to you today.  *If you need a refill on your cardiac medications before your next appointment, please call your pharmacy*  Lab Work: NONE  Testing/Procedures: NONE  Follow-Up: At [facility], you and your health needs are our priority.  As part of our continuing mission to provide you with exceptional heart care, we have created designated Provider Care Teams.  These Care Teams include your primary Cardiologist (physician) and Advanced Practice Providers (APPs -  Physician Assistants and Nurse Practitioners) who all work together to provide you with the care you need, when you need it.  We recommend signing up for the patient portal called "MyChart".  Sign up information is provided on this After Visit Summary.  MyChart is used to connect with patients for Virtual Visits (Telemedicine).  Patients are able  to view lab/test results, encounter notes, upcoming appointments, etc.  Non-urgent messages can be sent to your provider as well.   To learn more about what you can do with MyChart, go to [email or web address].    Your next appointment:   2 month(s)  The format for your next appointment:   In Person  Provider:   Jodelle Red, MD   PRESCRIPTION FOR ROLLING WALKER GIVEN, TRY [employer] IN YOUR SEARCH       I,Mathew  Stumpf,acting as a scribe for Jodelle Red, MD.,have documented all relevant documentation on the behalf of Jodelle Red, MD,as directed by  Jodelle Red, MD while in the presence of Jodelle Red, MD.  I, Jodelle Red, MD, have reviewed all documentation for this visit. The documentation on 12/29/22 for the exam, diagnosis, procedures, and orders are all accurate and complete.   Signed, Jodelle Red, MD PhD 12/29/2022     [facility]

## 2023-01-02 ENCOUNTER — Telehealth: Payer: Self-pay | Admitting: *Deleted

## 2023-01-02 ENCOUNTER — Telehealth (HOSPITAL_BASED_OUTPATIENT_CLINIC_OR_DEPARTMENT_OTHER): Payer: Self-pay

## 2023-01-02 NOTE — Telephone Encounter (Signed)
Patient with diagnosis of afib on Eliquis for anticoagulation.    Procedure:  REVISION OF LEFT ARTERIOVENOUS FISTULA  Date of procedure: 01/05/23   CHA2DS2-VASc Score = 6   This indicates a 9.7% annual risk of stroke. The patient's score is based upon: CHF History: 1 HTN History: 1 Diabetes History: 1 Stroke History: 0 Vascular Disease History: 1 Age Score: 1 Gender Score: 1      Patient is ESRD HD pt.  Per office protocol, patient can hold Eliquis for 3 days prior to procedure.    **This guidance is not considered finalized until pre-operative APP has relayed final recommendations.**

## 2023-01-02 NOTE — Telephone Encounter (Signed)
Patient with diagnosis of afib on Elqiuis for anticoagulation.    Procedure: EGD Date of procedure: 01/12/23   CHA2DS2-VASc Score = 6   This indicates a 9.7% annual risk of stroke. The patient's score is based upon: CHF History: 1 HTN History: 1 Diabetes History: 1 Stroke History: 0 Vascular Disease History: 1 Age Score: 1 Gender Score: 1      Patient is an ESRD pt on HD  Per office protocol, patient can hold Eliquis for 3 days prior to procedure.    Patient has another procedure on 5/17. She should resume her Eliquis post 5/17 procedure and then begin holding again 5/21  **This guidance is not considered finalized until pre-operative APP has relayed final recommendations.**

## 2023-01-02 NOTE — Telephone Encounter (Signed)
   Pre-operative Risk Assessment    Patient Name: [name]  DOB: 07-16-1956 MRN: [id number]    OUR OFFICE JUST RECEIVED THIS CLEARANCE REQUEST TODAY  Request for Surgical Clearance    Procedure:   REVISION OF LEFT ARTERIOVENOUS FISTULA  Date of Surgery:  Clearance 01/05/23                                 Surgeon:  DR. Waverly Ferrari Surgeon's Group or Practice Name:  [facility] Phone number:  [phone]; ATTNPablo Lawrence RN Fax number:  [phone]   Type of Clearance Requested:   - Medical  - Pharmacy:  Hold Apixaban (Eliquis)     Type of Anesthesia:  MAC   Additional requests/questions:    [name]   01/02/2023, 12:33 PM

## 2023-01-02 NOTE — Telephone Encounter (Signed)
   Pre-operative Risk Assessment    Patient Name: [name]  DOB: 1956/04/08 MRN: [id number]      Request for Surgical Clearance    Procedure:   EGD  Date of Surgery:  Clearance 01/12/23                                 Surgeon:  Jeani Hawking, MD Surgeon's Group or Practice Name:  [facility] [place] Phone number:  [phone] Fax number:  [phone]   Type of Clearance Requested:   - Medical  - Pharmacy:  Hold Apixaban (Eliquis) how long?   Type of Anesthesia:   Propofol   Additional requests/questions:   None  Signed, Jeanene Erb   01/02/2023, 3:28 PM

## 2023-01-03 ENCOUNTER — Other Ambulatory Visit: Payer: Self-pay | Admitting: Gastroenterology

## 2023-01-03 ENCOUNTER — Telehealth (INDEPENDENT_AMBULATORY_CARE_PROVIDER_SITE_OTHER): Payer: Self-pay | Admitting: Primary Care

## 2023-01-03 NOTE — Telephone Encounter (Addendum)
Caller was checking on the status of "Fax Request" form for diabetic shoes. Caller was advised PCP can not fill out form and it would have to be filled out by Dr. Alvis Lemmings. Form faxed on 5/13 and 5/15 to [facility] fax # [phone].  Informed caller please allow 5-7 business days regarding form turnaround time,   Please follow up with caller in 48 to 72 hour to confirm form was received;

## 2023-01-04 ENCOUNTER — Encounter (HOSPITAL_COMMUNITY): Payer: Self-pay | Admitting: Vascular Surgery

## 2023-01-04 ENCOUNTER — Encounter (HOSPITAL_BASED_OUTPATIENT_CLINIC_OR_DEPARTMENT_OTHER): Payer: Self-pay | Admitting: Cardiology

## 2023-01-04 ENCOUNTER — Other Ambulatory Visit: Payer: Self-pay

## 2023-01-04 ENCOUNTER — Encounter (HOSPITAL_COMMUNITY): Payer: Self-pay | Admitting: Gastroenterology

## 2023-01-04 NOTE — Progress Notes (Signed)
Spoke with pt for pre-op call. Pt has hx of CHF, HTN, Diabetes, DVT, COPD and sleep apnea (no longer uses a Cpap, having to get retested per patient). Pt denies any recent chest pain or shortness of breath. Pt states her fasting blood sugar is usually around 99. Instructed pt not to use her Insulin Lispro at bedtime tonight. Instructed pt to check blood sugar in the AM. If blood sugar is >220 take 1/2 of usual correction dose of Insulins lispro. If blood sugar is 70 or below, treat with 1/2 cup of clear juice (apple or cranberry) and recheck blood sugar 15 minutes after drinking juice. If blood sugar continues to be 70 or below, notify nurse on arrival in the AM to Surgical Short Stay.   Shower instructions given to pt.   Eliquis last dose was 01/01/23 (instructed to hold 3 days prior to surgery) by Dr. Janne Napoleon (cardiologist)

## 2023-01-04 NOTE — Progress Notes (Signed)
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 She was seen at the ED on 12/23/22 with atypical chest pain. She subsequently followed up with Dr. Cristal Deer on 12/29/22. Per note, "Atypical chest pain -not exertional, random -prior workup unremarkable-not likely cardiac in origin." Cardiac clearance per telephone encounter by Eligha Bridegroom, NP 01/04/23, "Chart reviewed as part of pre-operative protocol coverage. Given past medical history and time since last visit, based on ACC/AHA guidelines, Charelle Forren would be at acceptable risk for the planned procedure without further cardiovascular testing. Patient was advised that if she develops new symptoms prior to surgery to contact our office to arrange a follow-up appointment. She verbalized understanding. Patient is an ESRD pt on HD.Per office protocol, patient can hold Eliquis for 3 days prior to procedure.  Patient has another procedure on 5/17. She should resume her Eliquis post 5/17 procedure and then begin holding again 5/21. I reviewed these instructions with patient and she verbalized understanding and agreement."  LD Eliquis 01/01/23.  IDDM2, last A1c in Epic 6.0 06/08/22. Pt on once weekly GLP-1 agonist Trulicity. Per documentation from vascular surgery, she was instructed to hole 1 week prior. Pt reports she is no longer taking.  ESRD on HD T,Th, Sa.  Pt will need DOS labs and eval.  EKG 12/23/22: Atrial fibrillation. Rate 74. Nonspecific intraventricular conduction delay. Abnrm T, consider ischemia, anterolateral lds  CTA 12/23/22: IMPRESSION: 1. No evidence of thoracic or abdominal aortic aneurysm or dissection. 2. 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 3. No acute abnormality of the  chest, abdomen, or pelvis. 4. Small hiatal hernia. 5. Fat containing umbilical hernia.  TTE 03/13/22:  1. Abnormal septal motion . Left ventricular ejection fraction, by  estimation, is 50 to 55%. The left ventricle has low normal function. The  left ventricle has no regional wall motion abnormalities.   2. Right ventricular systolic function is normal. The right ventricular  size is normal.   3. Left atrial size was severely dilated.   4. Right atrial size was severely dilated.   5. Post repair with annuloplasty ring no doppler/color flow done. The  mitral valve was not assessed. No evidence of mitral valve regurgitation.  No evidence of mitral stenosis.   6. Post repair with ring no doppler/color flow done. Tricuspid valve  regurgitation is not demonstrated.   7. The aortic valve was not assessed. Aortic valve regurgitation is not  visualized. No aortic stenosis is present.   8. The inferior vena cava is normal in size with greater than 50%  respiratory variability, suggesting right atrial pressure of 3 mmHg.   RHC 03/13/22: 1. Normal PCWP 2. Mild pulmonary venous hypertension.  3. Mildly elevated RA pressure.  4. Preserved cardiac output.    LHC 12/31/17: Prox LAD lesion is 10% stenosed. Dist LM to Ost LAD lesion is 10% stenosed. Ost Cx lesion is 15% stenosed. The left ventricular systolic function is normal. LV end diastolic pressure is normal.   Zannie Cove [facility] Short Stay Center/Anesthesiology Phone [phone] 01/04/2023 10:09 AM

## 2023-01-04 NOTE — Telephone Encounter (Signed)
Returned Rosanne Ashing and made aware that we haven't received all paperwork including the certification form. Made Rosanne Ashing aware that pt hasn't had a foot exam in a couple of years. Rosanne Ashing stated that pt will need a foot exam. Made Rosanne Ashing aware that once we receive all paperwork I will contact pt to schedule an appt and will get paperwork done. Rosanne Ashing stated that he will fax form tomorrow.

## 2023-01-04 NOTE — Anesthesia Preprocedure Evaluation (Addendum)
Anesthesia Evaluation  Patient identified by MRN, date of birth, ID band Patient awake    Reviewed: Allergy & Precautions, NPO status , Patient's Chart, lab work & pertinent test results, reviewed documented beta blocker date and time   Airway Mallampati: I  TM Distance: >3 FB Neck ROM: Full    Dental  (+) Dental Advisory Given, Missing   Pulmonary asthma , sleep apnea , COPD,  COPD inhaler   Pulmonary exam normal breath sounds clear to auscultation       Cardiovascular hypertension, Pt. on home beta blockers and Pt. on medications +CHF  + dysrhythmias Atrial Fibrillation + Valvular Problems/Murmurs (s/p mitral and tricuspid valve repair)  Rhythm:Irregular Rate:Abnormal     Neuro/Psych  PSYCHIATRIC DISORDERS Anxiety Depression    negative neurological ROS     GI/Hepatic Neg liver ROS,GERD  Medicated,,  Endo/Other  diabetes, Type 2, Insulin Dependent  Morbid obesity  Renal/GU Dialysis and ESRFRenal disease     Musculoskeletal  (+) Arthritis ,    Abdominal   Peds  Hematology  (+) Blood dyscrasia (Eliquis)   Anesthesia Other Findings   Reproductive/Obstetrics                             Anesthesia Physical Anesthesia Plan  ASA: 3  Anesthesia Plan: Regional   Post-op Pain Management: Tylenol PO (pre-op)*   Induction: Intravenous  PONV Risk Score and Plan: 2 and Treatment may vary due to age or medical condition, Dexamethasone and Ondansetron  Airway Management Planned: LMA  Additional Equipment:   Intra-op Plan:   Post-operative Plan: Extubation in OR  Informed Consent: I have reviewed the patients History and Physical, chart, labs and discussed the procedure including the risks, benefits and alternatives for the proposed anesthesia with the patient or authorized representative who has indicated his/her understanding and acceptance.     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 She was seen at the ED on 12/23/22 with atypical chest pain. She subsequently followed up with Dr. Cristal Deer on 12/29/22. Per note, "Atypical chest pain -not exertional, random -prior workup unremarkable-not likely cardiac in origin." Cardiac clearance per telephone encounter by Eligha Bridegroom, NP 01/04/23, "Chart reviewed as part of pre-operative protocol coverage. Given past medical history and time since last visit, based on ACC/AHA guidelines, Virtue Klint would be at acceptable risk for the planned procedure without further cardiovascular testing. Patient was advised that if she develops new symptoms prior to surgery to contact our office to arrange a follow-up appointment. She verbalized understanding. Patient is an ESRD pt on HD.Per office protocol, patient can hold Eliquis for 3 days prior to procedure.  Patient has another procedure on 5/17. She should resume her Eliquis post 5/17 procedure and then begin holding again 5/21. I reviewed these instructions with patient and she verbalized understanding and agreement."  LD Eliquis 01/01/23.  IDDM2, last A1c in Epic 6.0 06/08/22. Pt on once weekly GLP-1 agonist Trulicity. Per documentation from vascular surgery, she was instructed to hole 1 week prior. Pt reports she is no longer taking.  ESRD on HD T,Th, Sa.  Pt will need DOS labs and eval.  EKG 12/23/22: Atrial fibrillation. Rate 74. Nonspecific  intraventricular conduction delay. Abnrm T, consider ischemia, anterolateral lds  CTA 12/23/22: IMPRESSION: 1. No evidence of thoracic or abdominal aortic aneurysm or dissection. 2. 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 3. No  acute abnormality of the chest, abdomen, or pelvis. 4. Small hiatal hernia. 5. Fat containing umbilical hernia.  TTE 03/13/22: 1. Abnormal septal motion . Left ventricular ejection fraction, by  estimation, is 50 to 55%. The left ventricle has low normal function. The  left ventricle has no regional wall motion abnormalities.  2. Right ventricular systolic function is normal. The right ventricular  size is normal.  3. Left atrial size was severely dilated.  4. Right atrial size was severely dilated.  5. Post repair with annuloplasty ring no doppler/color flow done. The  mitral valve was not assessed. No evidence of mitral valve regurgitation.  No evidence of mitral stenosis.  6. Post repair with ring no doppler/color flow done. Tricuspid valve  regurgitation is not demonstrated.  7. The aortic valve was not assessed. Aortic valve regurgitation is not  visualized. No aortic stenosis is present.  8. The inferior vena cava is normal in size with greater than 50%  respiratory variability, suggesting right atrial pressure of 3 mmHg.   RHC 03/13/22: 1. Normal PCWP 2. Mild pulmonary venous hypertension.  3. Mildly elevated RA pressure.  4. Preserved cardiac output.   LHC 12/31/17: ? Prox LAD lesion is 10% stenosed. ? Dist LM to Ost LAD lesion is 10% stenosed. ? Ost Cx lesion is 15% stenosed. ? The left ventricular systolic function is normal. ? LV end diastolic pressure is normal.  )        Anesthesia Quick Evaluation

## 2023-01-04 NOTE — Telephone Encounter (Signed)
   Primary Cardiologist: Jodelle Red, MD  Chart reviewed as part of pre-operative protocol coverage. Given past medical history and time since last visit, based on ACC/AHA guidelines, [name] would be at acceptable risk for the planned procedure without further cardiovascular testing.   Patient was advised that if she develops new symptoms prior to surgery to contact our office to arrange a follow-up appointment. She verbalized understanding.  Per office protocol, patient can hold Eliquis for 3 days prior to procedure.   I will route this recommendation to the requesting party via Epic fax function and remove from pre-op pool.  Please call with questions.  Levi Aland, NP-C  01/04/2023, 9:32 AM [address] Office [phone] Fax [phone]

## 2023-01-04 NOTE — Telephone Encounter (Signed)
   Primary Cardiologist: Jodelle Red, MD  Chart reviewed as part of pre-operative protocol coverage. Given past medical history and time since last visit, based on ACC/AHA guidelines, [name] would be at acceptable risk for the planned procedure without further cardiovascular testing.   Patient was advised that if she develops new symptoms prior to surgery to contact our office to arrange a follow-up appointment. She verbalized understanding.  Patient is an ESRD pt on HD Per office protocol, patient can hold Eliquis for 3 days prior to procedure.   Patient has another procedure on 5/17. She should resume her Eliquis post 5/17 procedure and then begin holding again 5/21. I reviewed these instructions with patient and she verbalized understanding and agreement.   I will route this recommendation to the requesting party via Epic fax function and remove from pre-op pool.  Please call with questions.  Levi Aland, NP-C  01/04/2023, 9:30 AM [address], [address] Office [phone] Fax [phone]

## 2023-01-05 ENCOUNTER — Ambulatory Visit (HOSPITAL_BASED_OUTPATIENT_CLINIC_OR_DEPARTMENT_OTHER): Payer: 59 | Admitting: Physician Assistant

## 2023-01-05 ENCOUNTER — Ambulatory Visit (HOSPITAL_COMMUNITY)
Admission: RE | Admit: 2023-01-05 | Discharge: 2023-01-05 | Disposition: A | Discharge status: Home / Self Care | Payer: 59 | Attending: Vascular Surgery | Admitting: Vascular Surgery

## 2023-01-05 ENCOUNTER — Encounter (HOSPITAL_COMMUNITY): Payer: Self-pay | Admitting: Vascular Surgery

## 2023-01-05 ENCOUNTER — Ambulatory Visit (HOSPITAL_COMMUNITY): Payer: 59 | Admitting: Physician Assistant

## 2023-01-05 ENCOUNTER — Other Ambulatory Visit: Payer: Self-pay

## 2023-01-05 ENCOUNTER — Encounter (HOSPITAL_COMMUNITY)
Admission: RE | Disposition: A | Discharge status: Home / Self Care | Payer: Self-pay | Source: Home / Self Care | Attending: Vascular Surgery

## 2023-01-05 DIAGNOSIS — I509 Heart failure, unspecified: Secondary | ICD-10-CM | POA: Diagnosis not present

## 2023-01-05 DIAGNOSIS — Z7901 Long term (current) use of anticoagulants: Secondary | ICD-10-CM | POA: Insufficient documentation

## 2023-01-05 DIAGNOSIS — I132 Hypertensive heart and chronic kidney disease with heart failure and with stage 5 chronic kidney disease, or end stage renal disease: Secondary | ICD-10-CM | POA: Insufficient documentation

## 2023-01-05 DIAGNOSIS — N185 Chronic kidney disease, stage 5: Secondary | ICD-10-CM | POA: Diagnosis not present

## 2023-01-05 DIAGNOSIS — T82858A Stenosis of vascular prosthetic devices, implants and grafts, initial encounter: Secondary | ICD-10-CM

## 2023-01-05 DIAGNOSIS — G4730 Sleep apnea, unspecified: Secondary | ICD-10-CM | POA: Insufficient documentation

## 2023-01-05 DIAGNOSIS — I5023 Acute on chronic systolic (congestive) heart failure: Secondary | ICD-10-CM | POA: Insufficient documentation

## 2023-01-05 DIAGNOSIS — Z6841 Body Mass Index (BMI) 40.0 and over, adult: Secondary | ICD-10-CM | POA: Insufficient documentation

## 2023-01-05 DIAGNOSIS — Z992 Dependence on renal dialysis: Secondary | ICD-10-CM | POA: Diagnosis not present

## 2023-01-05 DIAGNOSIS — E1122 Type 2 diabetes mellitus with diabetic chronic kidney disease: Secondary | ICD-10-CM

## 2023-01-05 DIAGNOSIS — N186 End stage renal disease: Secondary | ICD-10-CM

## 2023-01-05 DIAGNOSIS — Z794 Long term (current) use of insulin: Secondary | ICD-10-CM | POA: Diagnosis not present

## 2023-01-05 DIAGNOSIS — K219 Gastro-esophageal reflux disease without esophagitis: Secondary | ICD-10-CM | POA: Diagnosis not present

## 2023-01-05 DIAGNOSIS — J4489 Other specified chronic obstructive pulmonary disease: Secondary | ICD-10-CM | POA: Diagnosis not present

## 2023-01-05 HISTORY — PX: REVISON OF ARTERIOVENOUS FISTULA: SHX6074

## 2023-01-05 HISTORY — DX: Chronic kidney disease, unspecified: N18.9

## 2023-01-05 HISTORY — DX: Anemia, unspecified: D64.9

## 2023-01-05 LAB — POCT I-STAT, CHEM 8
BUN: 22 mg/dL (ref 8–23)
Calcium, Ion: 1.17 mmol/L (ref 1.15–1.40)
Chloride: 98 mmol/L (ref 98–111)
Creatinine, Ser: 6.7 mg/dL — ABNORMAL HIGH (ref 0.44–1.00)
Glucose, Bld: 99 mg/dL (ref 70–99)
HCT: 36 % (ref 36.0–46.0)
Hemoglobin: 12.2 g/dL (ref 12.0–15.0)
Potassium: 4.6 mmol/L (ref 3.5–5.1)
Sodium: 138 mmol/L (ref 135–145)
TCO2: 30 mmol/L (ref 22–32)

## 2023-01-05 LAB — GLUCOSE, CAPILLARY
Glucose-Capillary: 77 mg/dL (ref 70–99)
Glucose-Capillary: 99 mg/dL (ref 70–99)

## 2023-01-05 SURGERY — REVISON OF ARTERIOVENOUS FISTULA
Anesthesia: General | Site: Arm Upper | Laterality: Left

## 2023-01-05 MED ORDER — FENTANYL CITRATE (PF) 100 MCG/2ML IJ SOLN
INTRAMUSCULAR | Status: DC | PRN
  Administered 2023-01-05 (×5): 25 ug via INTRAVENOUS

## 2023-01-05 MED ORDER — 0.9 % SODIUM CHLORIDE (POUR BTL) OPTIME
TOPICAL | Status: DC | PRN
  Administered 2023-01-05: 1000 mL

## 2023-01-05 MED ORDER — LIDOCAINE HCL 1 % IJ SOLN
INTRAMUSCULAR | Status: AC
  Filled 2023-01-05: qty 20

## 2023-01-05 MED ORDER — FENTANYL CITRATE (PF) 250 MCG/5ML IJ SOLN
INTRAMUSCULAR | Status: AC
  Filled 2023-01-05: qty 5

## 2023-01-05 MED ORDER — PROMETHAZINE HCL 25 MG/ML IJ SOLN
6.2500 mg | INTRAMUSCULAR | Status: DC | PRN
  Administered 2023-01-05: 6.25 mg via INTRAVENOUS

## 2023-01-05 MED ORDER — PHENYLEPHRINE 80 MCG/ML (10ML) SYRINGE FOR IV PUSH (FOR BLOOD PRESSURE SUPPORT)
PREFILLED_SYRINGE | INTRAVENOUS | Status: DC | PRN
  Administered 2023-01-05: 160 ug via INTRAVENOUS

## 2023-01-05 MED ORDER — OXYCODONE-ACETAMINOPHEN 5-325 MG PO TABS: 1.0000 | ORAL_TABLET | Freq: Four times a day (QID) | ORAL | 0 refills | Status: DC | PRN

## 2023-01-05 MED ORDER — LIDOCAINE-EPINEPHRINE 1 %-1:100000 IJ SOLN
INTRAMUSCULAR | Status: AC
  Filled 2023-01-05: qty 1

## 2023-01-05 MED ORDER — MIDAZOLAM HCL 2 MG/2ML IJ SOLN
INTRAMUSCULAR | Status: AC
  Filled 2023-01-05: qty 2

## 2023-01-05 MED ORDER — PROPOFOL 10 MG/ML IV BOLUS
INTRAVENOUS | Status: DC | PRN
  Administered 2023-01-05: 120 mg via INTRAVENOUS

## 2023-01-05 MED ORDER — DEXAMETHASONE SODIUM PHOSPHATE 10 MG/ML IJ SOLN
INTRAMUSCULAR | Status: AC
  Filled 2023-01-05: qty 1

## 2023-01-05 MED ORDER — ACETAMINOPHEN 500 MG PO TABS
1000.0000 mg | ORAL_TABLET | Freq: Once | ORAL | Status: AC
  Administered 2023-01-05: 1000 mg via ORAL
  Filled 2023-01-05: qty 2

## 2023-01-05 MED ORDER — PHENYLEPHRINE HCL-NACL 20-0.9 MG/250ML-% IV SOLN
INTRAVENOUS | Status: DC | PRN
  Administered 2023-01-05: 50 ug/min via INTRAVENOUS

## 2023-01-05 MED ORDER — ONDANSETRON HCL 4 MG/2ML IJ SOLN
INTRAMUSCULAR | Status: AC
  Filled 2023-01-05: qty 2

## 2023-01-05 MED ORDER — CHLORHEXIDINE GLUCONATE 0.12 % MT SOLN
15.0000 mL | Freq: Once | OROMUCOSAL | Status: AC
  Administered 2023-01-05: 15 mL via OROMUCOSAL
  Filled 2023-01-05: qty 15

## 2023-01-05 MED ORDER — ORAL CARE MOUTH RINSE: 15.0000 mL | Freq: Once | OROMUCOSAL | Status: AC

## 2023-01-05 MED ORDER — CEFAZOLIN IN SODIUM CHLORIDE 3-0.9 GM/100ML-% IV SOLN
3.0000 g | INTRAVENOUS | Status: DC
  Filled 2023-01-05: qty 100

## 2023-01-05 MED ORDER — PROTAMINE SULFATE 10 MG/ML IV SOLN
INTRAVENOUS | Status: DC | PRN
  Administered 2023-01-05: 50 mg via INTRAVENOUS

## 2023-01-05 MED ORDER — HEPARIN 6000 UNIT IRRIGATION SOLUTION
Status: AC
  Filled 2023-01-05: qty 500

## 2023-01-05 MED ORDER — PROMETHAZINE HCL 25 MG/ML IJ SOLN
INTRAMUSCULAR | Status: AC
  Filled 2023-01-05: qty 1

## 2023-01-05 MED ORDER — THROMBIN (RECOMBINANT) 20000 UNITS EX SOLR
CUTANEOUS | Status: AC
  Filled 2023-01-05: qty 20000

## 2023-01-05 MED ORDER — FENTANYL CITRATE (PF) 100 MCG/2ML IJ SOLN: 25.0000 ug | INTRAMUSCULAR | Status: DC | PRN

## 2023-01-05 MED ORDER — SODIUM CHLORIDE 0.9 % IV SOLN: INTRAVENOUS | Status: DC

## 2023-01-05 MED ORDER — ONDANSETRON HCL 4 MG/2ML IJ SOLN
INTRAMUSCULAR | Status: DC | PRN
  Administered 2023-01-05: 4 mg via INTRAVENOUS

## 2023-01-05 MED ORDER — HEPARIN 6000 UNIT IRRIGATION SOLUTION
Status: DC | PRN
  Administered 2023-01-05: 1

## 2023-01-05 MED ORDER — ONDANSETRON HCL 4 MG/2ML IJ SOLN: 4.0000 mg | Freq: Once | INTRAMUSCULAR | Status: DC | PRN

## 2023-01-05 MED ORDER — PROPOFOL 500 MG/50ML IV EMUL
INTRAVENOUS | Status: DC | PRN
  Administered 2023-01-05: 100 ug/kg/min via INTRAVENOUS

## 2023-01-05 MED ORDER — DEXAMETHASONE SODIUM PHOSPHATE 4 MG/ML IJ SOLN
INTRAMUSCULAR | Status: DC | PRN
  Administered 2023-01-05: 4 mg via INTRAVENOUS

## 2023-01-05 MED ORDER — LIDOCAINE-EPINEPHRINE 1 %-1:100000 IJ SOLN
INTRAMUSCULAR | Status: DC | PRN
  Administered 2023-01-05: 8 mL via INTRADERMAL

## 2023-01-05 MED ORDER — MIDAZOLAM HCL 5 MG/5ML IJ SOLN
INTRAMUSCULAR | Status: DC | PRN
  Administered 2023-01-05 (×2): 1 mg via INTRAVENOUS

## 2023-01-05 MED ORDER — CHLORHEXIDINE GLUCONATE 4 % EX SOLN: 60.0000 mL | Freq: Once | CUTANEOUS | Status: DC

## 2023-01-05 MED ORDER — HEMOSTATIC AGENTS (NO CHARGE) OPTIME
TOPICAL | Status: DC | PRN
  Administered 2023-01-05: 1 via TOPICAL

## 2023-01-05 MED ORDER — HEPARIN SODIUM (PORCINE) 1000 UNIT/ML IJ SOLN
INTRAMUSCULAR | Status: DC | PRN
  Administered 2023-01-05: 10000 [IU] via INTRAVENOUS

## 2023-01-05 MED ORDER — HEPARIN SODIUM (PORCINE) 1000 UNIT/ML IJ SOLN
1.6000 mL | Freq: Once | INTRAMUSCULAR | Status: AC
  Administered 2023-01-05: 1.6 mL via INTRAVENOUS
  Filled 2023-01-05: qty 1.6

## 2023-01-05 MED ORDER — DEXTROSE 5 % IV SOLN
INTRAVENOUS | Status: DC | PRN
  Administered 2023-01-05: 3 g via INTRAVENOUS

## 2023-01-05 SURGICAL SUPPLY — 44 items
ADH SKN CLS APL DERMABOND .7 (GAUZE/BANDAGES/DRESSINGS) ×1
AGENT HMST 10 BLLW SHRT CANN (HEMOSTASIS) ×1
ARMBAND PINK RESTRICT EXTREMIT (MISCELLANEOUS) ×1 IMPLANT
BAG COUNTER SPONGE SURGICOUNT (BAG) ×1 IMPLANT
BAG SPNG CNTER NS LX DISP (BAG) ×1
CANISTER SUCT 3000ML PPV (MISCELLANEOUS) ×1 IMPLANT
CANNULA VESSEL 3MM 2 BLNT TIP (CANNULA) ×1 IMPLANT
CLIP TI MEDIUM 6 (CLIP) ×1 IMPLANT
CLIP TI WIDE RED SMALL 6 (CLIP) ×1 IMPLANT
COVER PROBE W GEL 5X96 (DRAPES) IMPLANT
DERMABOND ADVANCED .7 DNX12 (GAUZE/BANDAGES/DRESSINGS) ×1 IMPLANT
ELECT REM PT RETURN 9FT ADLT (ELECTROSURGICAL) ×1
ELECTRODE REM PT RTRN 9FT ADLT (ELECTROSURGICAL) ×1 IMPLANT
GLOVE BIO SURGEON STRL SZ 6.5 (GLOVE) IMPLANT
GLOVE BIO SURGEON STRL SZ7.5 (GLOVE) ×1 IMPLANT
GLOVE BIOGEL PI IND STRL 7.0 (GLOVE) IMPLANT
GLOVE BIOGEL PI IND STRL 7.5 (GLOVE) IMPLANT
GLOVE BIOGEL PI IND STRL 8 (GLOVE) ×1 IMPLANT
GLOVE SURG POLY ORTHO LF SZ7.5 (GLOVE) IMPLANT
GLOVE SURG UNDER LTX SZ8 (GLOVE) ×1 IMPLANT
GOWN STRL REUS W/ TWL LRG LVL3 (GOWN DISPOSABLE) ×3 IMPLANT
GOWN STRL REUS W/TWL LRG LVL3 (GOWN DISPOSABLE) ×3
GRAFT GORETEX STRT 4-7X45 (Vascular Products) IMPLANT
HEMOSTAT HEMOBLAST BELLOWS (HEMOSTASIS) IMPLANT
KIT BASIN OR (CUSTOM PROCEDURE TRAY) ×1 IMPLANT
KIT TURNOVER KIT B (KITS) ×1 IMPLANT
NS IRRIG 1000ML POUR BTL (IV SOLUTION) ×1 IMPLANT
PACK CV ACCESS (CUSTOM PROCEDURE TRAY) ×1 IMPLANT
PAD ARMBOARD 7.5X6 YLW CONV (MISCELLANEOUS) ×2 IMPLANT
PATCH VASC XENOSURE 1CMX6CM (Vascular Products) IMPLANT
PATCH VASC XENOSURE 1X6 (Vascular Products) IMPLANT
SLING ARM FOAM STRAP LRG (SOFTGOODS) IMPLANT
SLING ARM FOAM STRAP MED (SOFTGOODS) IMPLANT
SOL PREP POV-IOD 4OZ 10% (MISCELLANEOUS) IMPLANT
SOL SCRUB PVP POV-IOD 4OZ 7.5% (MISCELLANEOUS) ×1
SOLUTION SCRB POV-IOD 4OZ 7.5% (MISCELLANEOUS) IMPLANT
SPONGE SURGIFOAM ABS GEL 100 (HEMOSTASIS) IMPLANT
SUT MNCRL AB 4-0 PS2 18 (SUTURE) ×1 IMPLANT
SUT PROLENE 6 0 BV (SUTURE) ×1 IMPLANT
SUT VIC AB 3-0 SH 27 (SUTURE) ×1
SUT VIC AB 3-0 SH 27X BRD (SUTURE) ×1 IMPLANT
TOWEL GREEN STERILE (TOWEL DISPOSABLE) ×1 IMPLANT
UNDERPAD 30X36 HEAVY ABSORB (UNDERPADS AND DIAPERS) ×1 IMPLANT
WATER STERILE IRR 1000ML POUR (IV SOLUTION) ×1 IMPLANT

## 2023-01-05 NOTE — H&P (Addendum)
ASSESSMENT & PLAN   CLOTTED LEFT AVF: Her fistula is now clotted. Will proceed with placement of an AVG. She has a catheter. She dialyzes on TTS  REASON FOR VISIT:    For new access.  HPI:   [name] is a 67 y.o. female who had a left BC AVF with superficialization on 03/17/22. I last saw her on 10/26/22. This patient was referred for evaluation for placement of a graft as it was felt that the fistula was occluded. However by duplex the fistula was patent with diameters ranging from 5.2-5.7 mm. It is tunneled superficially. The issue with the fistula is a tight stenosis at the proximal anastomosis. She presents today and her fistula is now clotted.    She is on Eliquis and this was stopped  48 hours prior to her procedure.   Past Medical History:  Diagnosis Date   Acute on chronic systolic CHF (congestive heart failure) (HCC) 12/21/2020   Allergic rhinitis    Anemia    Arthritis    Asthma    Chronic diastolic CHF (congestive heart failure) (HCC)    Chronic kidney disease    Dialysis Tu/TH/Sa   COPD (chronic obstructive pulmonary disease) (HCC)    Depression    DM (diabetes mellitus) (HCC)    DVT (deep vein thrombosis) in pregnancy    GERD (gastroesophageal reflux disease)    HTN (hypertension)    Hyperlipidemia    Obesity    Pneumonia 04/2018   RIGHT LOBE   Sleep apnea    needs to be retested - to get new cpap    Family History  Problem Relation Age of Onset   Breast cancer Mother    Cancer Mother    Hypertension Mother    Cancer Father    Hypertension Sister    Hypertension Brother    CAD Other     SOCIAL HISTORY: Social History   Tobacco Use   Smoking status: Never    Passive exposure: Never   Smokeless tobacco: Never   Tobacco comments:    Never smoke 10/20/22  Substance Use Topics   Alcohol use: No    Allergies  Allergen Reactions   Bee Pollen Anaphylaxis and Other (See Comments)    UNCONFIRMED, but IS allergic to bee VENOM   Bee  Venom Anaphylaxis   Sulfa Antibiotics Itching   Iodinated Contrast Media Nausea And Vomiting    Current Facility-Administered Medications  Medication Dose Route Frequency Provider Last Rate Last Admin   0.9 %  sodium chloride infusion   Intravenous Continuous Chuck Hint, MD       0.9 %  sodium chloride infusion   Intravenous Continuous Collene Schlichter, MD       ceFAZolin (ANCEF) IVPB 3g/100 mL premix  3 g Intravenous 30 min Pre-Op Chuck Hint, MD       chlorhexidine (HIBICLENS) 4 % liquid 4 Application  60 mL Topical Once Chuck Hint, MD       And   [START ON 01/06/2023] chlorhexidine (HIBICLENS) 4 % liquid 4 Application  60 mL Topical Once Chuck Hint, MD        REVIEW OF SYSTEMS:  [X]  denotes positive finding, [ ]  denotes negative finding Cardiac  Comments:  Chest pain or chest pressure:    Shortness of breath upon exertion:    Short of breath when lying flat:    Irregular heart rhythm:        Vascular  Pain in calf, thigh, or hip brought on by ambulation:    Pain in feet at night that wakes you up from your sleep:     Blood clot in your veins:    Leg swelling:         Pulmonary    Oxygen at home:    Productive cough:     Wheezing:         Neurologic    Sudden weakness in arms or legs:     Sudden numbness in arms or legs:     Sudden onset of difficulty speaking or slurred speech:    Temporary loss of vision in one eye:     Problems with dizziness:         Gastrointestinal    Blood in stool:     Vomited blood:         Genitourinary    Burning when urinating:     Blood in urine:        Psychiatric    Major depression:         Hematologic    Bleeding problems:    Problems with blood clotting too easily:        Skin    Rashes or ulcers:        Constitutional    Fever or chills:    -  PHYSICAL EXAM:   Vitals:   01/05/23 0630  BP: 114/67  Resp: 20  Temp: 97.6 F (36.4 C)  SpO2: 98%   There is no height  or weight on file to calculate BMI. GENERAL: The patient is a well-nourished female, in no acute distress. The vital signs are documented above. CARDIAC: There is a regular rate and rhythm.  VASCULAR: Her left BC AVF is clotted.  PULMONARY: There is good air exchange bilaterally without wheezing or rales. MUSCULOSKELETAL: There are no major deformities. NEUROLOGIC: No focal weakness or paresthesias are detected. SKIN: There are no ulcers or rashes noted. PSYCHIATRIC: The patient has a normal affect.  DATA:    K = 4.6   Waverly Ferrari [facility]

## 2023-01-05 NOTE — Discharge Instructions (Addendum)
[facility]  Discharge Instructions  AV Fistula or Graft Surgery for Dialysis Access  Please refer to the following instructions for your post-procedure care. Your surgeon or physician assistant will discuss any changes with you.  Activity  You may drive the day following your surgery, if you are comfortable and no longer taking prescription pain medication. Resume full activity as the soreness in your incision resolves.  Bathing/Showering  You may shower after you go home. Keep your incision dry for 48 hours. Do not soak in a bathtub, hot tub, or swim until the incision heals completely. You may not shower if you have a hemodialysis catheter.  Incision Care  Clean your incision with mild soap and water after 48 hours. Pat the area dry with a clean towel. You do not need a bandage unless otherwise instructed. Do not apply any ointments or creams to your incision. You may have skin glue on your incision. Do not peel it off. It will come off on its own in about one week. Your arm may swell a bit after surgery. To reduce swelling use pillows to elevate your arm so it is above your heart. Your doctor will tell you if you need to lightly wrap your arm with an ACE bandage.  Diet  Resume your normal diet. There are not special food restrictions following this procedure. In order to heal from your surgery, it is CRITICAL to get adequate nutrition. Your body requires vitamins, minerals, and protein. Vegetables are the best source of vitamins and minerals. Vegetables also provide the perfect balance of protein. Processed food has little nutritional value, so try to avoid this.  Medications  Resume taking all of your medications. If your incision is causing pain, you may take over-the counter pain relievers such as acetaminophen (Tylenol). If you were prescribed a stronger pain medication, please be aware these medications can cause nausea and constipation. Prevent  nausea by taking the medication with a snack or meal. Avoid constipation by drinking plenty of fluids and eating foods with high amount of fiber, such as fruits, vegetables, and grains.  Do not take Tylenol if you are taking prescription pain medications.  Restart Eliquis on 01/06/2023.  Follow up Your surgeon may want to see you in the office following your access surgery. If so, this will be arranged at the time of your surgery.  Please call us immediately for any of the following conditions:  Increased pain, redness, drainage (pus) from your incision site Fever of 101 degrees or higher Severe or worsening pain at your incision site Hand pain or numbness.  Reduce your risk of vascular disease:  Stop smoking. If you would like help, call [facility] at 1-800-QUIT-NOW (514-623-0893) or [place] at 770 033 5018  Manage your cholesterol Maintain a desired weight Control your diabetes Keep your blood pressure down  Dialysis  It will take several weeks to several months for your new dialysis access to be ready for use. Your surgeon will determine when it is okay to use it. Your nephrologist will continue to direct your dialysis. You can continue to use your Permcath until your new access is ready for use.   01/05/2023 [name] [id number] [birth year]  Surgeon(s): Chuck Hint, MD  Procedure(s): REVISION OF LEFT ARTERIOVENOUS FISTULA WITH INTERPOSITION PTFE GRAFT    x May stick graft on designated area only:  Do not stick over new incision x 8 weeks. SEE DIAGRAM NOTE: fistula is medial to old upper  arm incisions and okay to stick now SEE DIAGRAM   If you have any questions, please call the office at [phone].

## 2023-01-05 NOTE — Op Note (Signed)
    NAME: [name]    MRN: [id number] DOB: August 07, 1956    DATE OF OPERATION: 01/05/2023  PREOP DIAGNOSIS:    End-stage renal disease  POSTOP DIAGNOSIS:    End-stage renal disease  PROCEDURE:    Revision of left brachiocephalic AV fistula with interposition PTFE graft  SURGEON: Di Kindle. Edilia Bo, MD  ASSIST: Doreatha Massed, PA  ANESTHESIA: General  EBL: Minimal  INDICATIONS:    [name] is a 67 y.o. female who presents with a nonfunctioning upper arm fistula.  There is a tight stenosis at the proximal fistula and she presents for revision.  FINDINGS:   There was severe intimal hyperplasia at the proximal fistula.  I did not think this could be patch and therefore I replaced the segment with an interposition tapered segment of PTFE graft  TECHNIQUE:   The patient was taken to the operating room and received a general anesthetic.  The left arm was prepped and draped in usual sterile fashion.  I did look with the SonoSite and the fistula was patent.  There was marked stenosis at the proximal fistula.  The left arm was prepped and draped in usual sterile fashion.  An incision was made over the proximal fistula.  This was dissected free circumferentially.  I dissected free the brachial artery proximal and distal to the anastomosis and the patient was heparinized.  The vessels were all controlled and a longitudinal venotomy made through the proximal fistula.  There was marked intimal hyperplasia.  I did not think there was any way to patch this given the severe stenosis and intimal hyperplasia.  I elected to replace the segment with an interposition segment of PTFE graft.  A 4-7 mm PTFE graft was brought to the field.  It was spatulated at the 4 mm and in the graft sewn end-to-side to the brachial artery using continuous 6-0 Prolene suture.  It was then spatulated for anastomosis to the cephalic vein distally.  This anastomosis was done with two 6-0 Prolene  sutures.  At the completion it was an excellent thrill in the fistula.  There was a good radial and ulnar signal with the Doppler.  Hemostasis was obtained in the wound.  The wound was closed with a deep layer of 3-0 Vicryl.  The skin was closed with 4-0 Monocryl.  Dermabond was applied.  The patient tolerated the procedure well was transferred to recovery in stable condition.  All needle and sponge counts were correct.  Given the complexity of the case a first assistant was necessary in order to expedient the procedure and safely perform the technical aspects of the operation.  Waverly Ferrari, MD, FACS [facility]  DATE OF DICTATION:   01/05/2023

## 2023-01-05 NOTE — Anesthesia Postprocedure Evaluation (Signed)
Anesthesia Post Note  Patient: Siana Alexis  Procedure(s) Performed: REVISION OF LEFT ARTERIOVENOUS FISTULA WITH INTERPOSITION PTFE GRAFT (Left: Arm Upper)     Patient location during evaluation: PACU Anesthesia Type: General Level of consciousness: awake and alert Pain management: pain level controlled Vital Signs Assessment: post-procedure vital signs reviewed and stable Respiratory status: spontaneous breathing, nonlabored ventilation, respiratory function stable and patient connected to nasal cannula oxygen Cardiovascular status: blood pressure returned to baseline and stable Postop Assessment: no apparent nausea or vomiting Anesthetic complications: no   No notable events documented.  Last Vitals:  Vitals:   01/05/23 1000 01/05/23 1015  BP: 119/67 134/74  Pulse: 76 78  Resp: 19 20  Temp:  36.6 C  SpO2: 97% 97%    Last Pain:  Vitals:   01/05/23 1015  PainSc: 0-No pain                 Collene Schlichter

## 2023-01-05 NOTE — Transfer of Care (Signed)
Immediate Anesthesia Transfer of Care Note  Patient: [name]  Procedure(s) Performed: REVISION OF LEFT ARTERIOVENOUS FISTULA WITH INTERPOSITION PTFE GRAFT (Left: Arm Upper)  Patient Location: PACU  Anesthesia Type:General  Level of Consciousness: drowsy  Airway & Oxygen Therapy: Patient Spontanous Breathing and Patient connected to face mask oxygen  Post-op Assessment: Report given to RN and Post -op Vital signs reviewed and stable  Post vital signs: Reviewed and stable  Last Vitals:  Vitals Value Taken Time  BP 130/55 01/05/23 0932  Temp    Pulse 65 01/05/23 0934  Resp 19 01/05/23 0934  SpO2 100 % 01/05/23 0934  Vitals shown include unvalidated device data.  Last Pain:  Vitals:   01/05/23 0630  PainSc: 0-No pain         Complications: No notable events documented.

## 2023-01-05 NOTE — Anesthesia Procedure Notes (Signed)
Procedure Name: LMA Insertion Date/Time: 01/05/2023 7:39 AM  Performed by: Caren Macadam, CRNAPre-anesthesia Checklist: Patient identified, Emergency Drugs available, Suction available and Patient being monitored Patient Re-evaluated:Patient Re-evaluated prior to induction Oxygen Delivery Method: Circle system utilized Preoxygenation: Pre-oxygenation with 100% oxygen Induction Type: IV induction Ventilation: Mask ventilation without difficulty LMA: LMA inserted LMA Size: 4.0 Number of attempts: 1 Placement Confirmation: positive ETCO2 and breath sounds checked- equal and bilateral Tube secured with: Tape Dental Injury: Teeth and Oropharynx as per pre-operative assessment

## 2023-01-08 ENCOUNTER — Telehealth: Payer: Self-pay | Admitting: Vascular Surgery

## 2023-01-08 ENCOUNTER — Encounter (HOSPITAL_COMMUNITY): Payer: Self-pay | Admitting: Vascular Surgery

## 2023-01-08 NOTE — Telephone Encounter (Signed)
-----   Message from Dara Lords, [place] sent at 01/05/2023  9:39 AM EDT ----- S/p revision of left arm fistula 5/17.  Please have pt return in 4-6 weeks with dialysis duplex on Dr. Edilia Bo clinic day.  Thanks

## 2023-01-09 NOTE — Telephone Encounter (Signed)
Reached out to [place] and made aware that I haven't received any paperwork on pt in regards to DM shoes. Per Rosanne Ashing he will be in the area and will come by and drop off paperwork

## 2023-01-11 ENCOUNTER — Encounter (HOSPITAL_COMMUNITY): Payer: Self-pay

## 2023-01-11 ENCOUNTER — Other Ambulatory Visit: Payer: Self-pay

## 2023-01-11 ENCOUNTER — Emergency Department (HOSPITAL_COMMUNITY): Payer: 59

## 2023-01-11 ENCOUNTER — Emergency Department (HOSPITAL_COMMUNITY)
Admission: EM | Admit: 2023-01-11 | Discharge: 2023-01-11 | Disposition: A | Discharge status: Home / Self Care | Payer: 59 | Attending: Emergency Medicine | Admitting: Emergency Medicine

## 2023-01-11 DIAGNOSIS — R110 Nausea: Secondary | ICD-10-CM

## 2023-01-11 DIAGNOSIS — I119 Hypertensive heart disease without heart failure: Secondary | ICD-10-CM | POA: Diagnosis not present

## 2023-01-11 DIAGNOSIS — Z9049 Acquired absence of other specified parts of digestive tract: Secondary | ICD-10-CM | POA: Insufficient documentation

## 2023-01-11 DIAGNOSIS — R079 Chest pain, unspecified: Secondary | ICD-10-CM | POA: Diagnosis not present

## 2023-01-11 DIAGNOSIS — Z7901 Long term (current) use of anticoagulants: Secondary | ICD-10-CM | POA: Insufficient documentation

## 2023-01-11 DIAGNOSIS — R112 Nausea with vomiting, unspecified: Secondary | ICD-10-CM | POA: Diagnosis present

## 2023-01-11 DIAGNOSIS — Z794 Long term (current) use of insulin: Secondary | ICD-10-CM | POA: Diagnosis not present

## 2023-01-11 DIAGNOSIS — I10 Essential (primary) hypertension: Secondary | ICD-10-CM | POA: Insufficient documentation

## 2023-01-11 DIAGNOSIS — I700 Atherosclerosis of aorta: Secondary | ICD-10-CM | POA: Insufficient documentation

## 2023-01-11 DIAGNOSIS — R1084 Generalized abdominal pain: Secondary | ICD-10-CM | POA: Insufficient documentation

## 2023-01-11 LAB — COMPREHENSIVE METABOLIC PANEL
ALT: 5 U/L (ref 0–44)
AST: 22 U/L (ref 15–41)
Albumin: 3.6 g/dL (ref 3.5–5.0)
Alkaline Phosphatase: 84 U/L (ref 38–126)
Anion gap: 13 (ref 5–15)
BUN: 14 mg/dL (ref 8–23)
CO2: 31 mmol/L (ref 22–32)
Calcium: 9.5 mg/dL (ref 8.9–10.3)
Chloride: 92 mmol/L — ABNORMAL LOW (ref 98–111)
Creatinine, Ser: 4.49 mg/dL — ABNORMAL HIGH (ref 0.44–1.00)
GFR, Estimated: 10 mL/min — ABNORMAL LOW (ref >60)
Glucose, Bld: 127 mg/dL — ABNORMAL HIGH (ref 70–99)
Potassium: 4.7 mmol/L (ref 3.5–5.1)
Sodium: 136 mmol/L (ref 135–145)
Total Bilirubin: 0.9 mg/dL (ref 0.3–1.2)
Total Protein: 8.1 g/dL (ref 6.5–8.1)

## 2023-01-11 LAB — CBC
HCT: 36.5 % (ref 36.0–46.0)
Hemoglobin: 11.7 g/dL — ABNORMAL LOW (ref 12.0–15.0)
MCH: 30.5 pg (ref 26.0–34.0)
MCHC: 32.1 g/dL (ref 30.0–36.0)
MCV: 95.3 fL (ref 80.0–100.0)
Platelets: 145 10*3/uL — ABNORMAL LOW (ref 150–400)
RBC: 3.83 MIL/uL — ABNORMAL LOW (ref 3.87–5.11)
RDW: 14.4 % (ref 11.5–15.5)
WBC: 5.8 10*3/uL (ref 4.0–10.5)
nRBC: 0 % (ref 0.0–0.2)

## 2023-01-11 LAB — TROPONIN I (HIGH SENSITIVITY): Troponin I (High Sensitivity): 5 ng/L (ref <18)

## 2023-01-11 LAB — BRAIN NATRIURETIC PEPTIDE: B Natriuretic Peptide: 144.1 pg/mL — ABNORMAL HIGH (ref 0.0–100.0)

## 2023-01-11 MED ORDER — ONDANSETRON 4 MG PO TBDP
8.0000 mg | ORAL_TABLET | Freq: Once | ORAL | Status: AC
  Administered 2023-01-11: 8 mg via ORAL
  Filled 2023-01-11: qty 2

## 2023-01-11 MED ORDER — ONDANSETRON 4 MG PO TBDP: 4.0000 mg | ORAL_TABLET | Freq: Three times a day (TID) | ORAL | 0 refills | Status: DC | PRN

## 2023-01-11 NOTE — Discharge Instructions (Signed)
As discussed, your evaluation today has been largely reassuring.  But, it is important that you monitor your condition carefully, and do not hesitate to return to the ED if you develop new, or concerning changes in your condition. ? ?Otherwise, please follow-up with your physician for appropriate ongoing care. ? ?

## 2023-01-11 NOTE — ED Provider Notes (Signed)
[place] EMERGENCY DEPARTMENT AT [facility] Provider Note   CSN: [id number] Arrival date & time: 01/11/23  1204     History  Chief Complaint  Patient presents with   Nausea   Chest Pain    [name] is a 67 y.o. female.  HPI Patient presents with concern for nausea, vomiting, left upper abdominal pain.  She completed dialysis session, and had symptoms that were minimally present throughout, but became worse subsequently.  No fever, no chest pain beyond the left infracostal region.  She notes a history of ongoing nausea, no recent similar pain, however.     Home Medications Prior to Admission medications   Medication Sig Start Date End Date Taking? Authorizing Provider  ondansetron (ZOFRAN-ODT) 4 MG disintegrating tablet Take 1 tablet (4 mg total) by mouth every 8 (eight) hours as needed for nausea or vomiting. 01/11/23  Yes Gerhard Munch, MD  acetaminophen (TYLENOL) 325 MG tablet Take 2 tablets (650 mg total) by mouth every 6 (six) hours as needed for mild pain or moderate pain. 12/07/21   Arrien, York Ram, MD  albuterol (PROVENTIL HFA) 108 (90 Base) MCG/ACT inhaler Inhale 1-2 puffs into the lungs every 6 (six) hours as needed for wheezing or shortness of breath. 10/13/22   Grayce Sessions, NP  amiodarone (PACERONE) 200 MG tablet TAKE 1 TABLET BY MOUTH TWICE A DAY Patient taking differently: Take 200 mg by mouth daily. 04/25/22   Bensimhon, Bevelyn Buckles, MD  Blood Glucose Monitoring Suppl (ACCU-CHEK GUIDE) w/Device KIT Use to check blood sugar TID. Dx: E11.69 06/28/22   Hoy Register, MD  busPIRone (BUSPAR) 5 MG tablet TAKE ONE TABLET BY MOUTH TWICE DAILY @ 9AM & 5PM 09/06/22   Grayce Sessions, NP  colchicine 0.6 MG tablet Take 1/2 (one-half) tablet by mouth once daily 11/23/22   Hoy Register, MD  diphenhydrAMINE (BENADRYL) 25 mg capsule Take 25 mg by mouth every 6 (six) hours as needed for allergies or itching.    [provider]   Dulaglutide (TRULICITY) 1.5 MG/0.5ML SOPN Inject 1.5 mg into the skin once a week. Patient not taking: Reported on 01/03/2023 07/05/22   Grayce Sessions, NP  DULoxetine (CYMBALTA) 60 MG capsule Take 1 capsule (60 mg total) by mouth daily. 07/05/22   Grayce Sessions, NP  ELIQUIS 5 MG TABS tablet TAKE 1 TABLET BY MOUTH TWICE A DAY 11/13/22   Jodelle Red, MD  EPINEPHrine 0.3 mg/0.3 mL IJ SOAJ injection Inject 0.3 mg into the muscle as needed for anaphylaxis. 06/25/20   Grayce Sessions, NP  fluticasone (FLONASE) 50 MCG/ACT nasal spray Place 2 sprays into both nostrils daily as needed for allergies or rhinitis. 09/22/22   Grayce Sessions, NP  glucose blood (ACCU-CHEK GUIDE) test strip USE TO CHECK BLOOD SUGAR 3 TIMES DAILY 04/03/22   Hoy Register, MD  insulin lispro (HUMALOG KWIKPEN) 100 UNIT/ML KwikPen Inject 10 Units into the skin 3 (three) times daily. Patient taking differently: Inject 5-10 Units into the skin 3 (three) times daily. Sling scale 105= 1-2 units 106= 07/05/22   Grayce Sessions, NP  isosorbide mononitrate (IMDUR) 30 MG 24 hr tablet Take 0.5 tablets (15 mg total) by mouth daily. 03/18/22 12/29/22  Arrien, York Ram, MD  lidocaine-prilocaine (EMLA) cream Apply 1 Application topically Every Tuesday,Thursday,and Saturday with dialysis. 06/01/22   [provider]  linaclotide (LINZESS) 72 MCG capsule TAKE 1 CAPSULE (72 MCG TOTAL) BY MOUTH DAILY BEFORE BREAKFAST. IF BM NOT  ACHIEVE WITH 1 CAPSULE SHE MAY TAKE 2 DAILY. STOP IF DIARRHEA OCCURS Patient taking differently: Take 72 mcg by mouth daily as needed (Constipation).  STOP IF DIARRHEA OCCURS 07/05/22   Grayce Sessions, NP  loratadine (CLARITIN) 10 MG tablet TAKE ONE TABLET BY MOUTH DAILY AT 9AM 09/06/22   Grayce Sessions, NP  metoprolol succinate (TOPROL-XL) 25 MG 24 hr tablet TAKE 1 TABLET BY MOUTH IN THE MORNING AND AT BEDTIME Patient not taking: Reported on 01/03/2023 09/12/22   Lanier Prude, MD  midodrine (PROAMATINE) 10 MG tablet Take 10 mg by mouth Every Tuesday,Thursday,and Saturday with dialysis. 30 min before Dialysis Take 10 mg 2 hours after on dialysis 08/16/22   [provider]  Multiple Vitamins-Minerals (MULTIVITAMIN WOMEN PO) Take 1 tablet by mouth daily.    [provider]  multivitamin (RENA-VIT) TABS tablet Take 1 tablet by mouth daily. 04/27/22   [provider]  nitroGLYCERIN (NITROSTAT) 0.4 MG SL tablet Place 1 tablet (0.4 mg total) under the tongue every 5 (five) minutes x 3 doses as needed for chest pain. 01/08/14   Rinaldo Cloud, MD  oxyCODONE-acetaminophen (PERCOCET) 5-325 MG tablet Take 1 tablet by mouth every 6 (six) hours as needed for severe pain. 01/05/23   Rhyne, Ames Coupe, PA-C  pantoprazole (PROTONIX) 40 MG tablet TAKE ONE TABLET BY MOUTH DAILY AT 9AM Patient not taking: Reported on 01/03/2023 04/17/22   Grayce Sessions, NP  polyethylene glycol powder (GLYCOLAX/MIRALAX) 17 GM/SCOOP powder Take 17 g by mouth daily. Patient taking differently: Take 17 g by mouth daily as needed for mild constipation. 02/25/22   Burnadette Pop, MD  pregabalin (LYRICA) 100 MG capsule Take 1 capsule (100 mg total) by mouth 3 (three) times daily as needed (for pain). Patient not taking: Reported on 01/03/2023 07/05/22   Grayce Sessions, NP  RESTASIS 0.05 % ophthalmic emulsion Place 1 drop into both eyes in the morning and at bedtime. 11/19/17   [provider]  rosuvastatin (CRESTOR) 10 MG tablet TAKE ONE TABLET BY MOUTH DAILY AT 5PM 12/05/22   Bensimhon, Bevelyn Buckles, MD  senna-docusate (SENOKOT-S) 8.6-50 MG tablet Take 1 tablet by mouth 2 (two) times daily. Patient not taking: Reported on 01/03/2023 02/24/22   Burnadette Pop, MD  torsemide (DEMADEX) 100 MG tablet Take 100 mg by mouth See admin instructions.  Take Sunday,Monday, Wednesday and Friday 04/02/22   [provider]      Allergies    Bee pollen, Bee venom, Sulfa  antibiotics, and Iodinated contrast media    Review of Systems   Review of Systems  All other systems reviewed and are negative.   Physical Exam Updated Vital Signs BP 111/67 (BP Location: Right Arm)   Pulse 90   Temp 100.3 F (37.9 C) (Oral)   Resp 18   Ht 5\' 7"  (1.702 m)   Wt 122 kg   SpO2 96%   BMI 42.13 kg/m  Physical Exam Vitals and nursing note reviewed.  Constitutional:      General: She is not in acute distress.    Appearance: She is well-developed.  HENT:     Head: Normocephalic and atraumatic.  Eyes:     Conjunctiva/sclera: Conjunctivae normal.  Cardiovascular:     Rate and Rhythm: Normal rate and regular rhythm.  Pulmonary:     Effort: Pulmonary effort is normal. No respiratory distress.     Breath sounds: Normal breath sounds. No stridor.  Chest:    Abdominal:  General: There is no distension.  Skin:    General: Skin is warm and dry.  Neurological:     Mental Status: She is alert and oriented to person, place, and time.     Cranial Nerves: No cranial nerve deficit.  Psychiatric:        Mood and Affect: Mood normal.     ED Results / Procedures / Treatments   Labs (all labs ordered are listed, but only abnormal results are displayed) Labs Reviewed  CBC - Abnormal; Notable for the following components:      Result Value   RBC 3.83 (*)    Hemoglobin 11.7 (*)    Platelets 145 (*)    All other components within normal limits  COMPREHENSIVE METABOLIC PANEL - Abnormal; Notable for the following components:   Chloride 92 (*)    Glucose, Bld 127 (*)    Creatinine, Ser 4.49 (*)    GFR, Estimated 10 (*)    All other components within normal limits  BRAIN NATRIURETIC PEPTIDE - Abnormal; Notable for the following components:   B Natriuretic Peptide 144.1 (*)    All other components within normal limits  TROPONIN I (HIGH SENSITIVITY)  TROPONIN I (HIGH SENSITIVITY)    EKG EKG Interpretation  Date/Time:  Thursday Jan 11 2023 12:28:32  EDT Ventricular Rate:  90 PR Interval:    QRS Duration: 86 QT Interval:  412 QTC Calculation: 504 R Axis:   136 Text Interpretation: Sinus rhythm Artifact ST-t wave abnormality Abnormal ECG Confirmed by Gerhard Munch [phone]) on 01/11/2023 12:31:27 PM  Radiology CT ABDOMEN PELVIS WO CONTRAST  Result Date: 01/11/2023 CLINICAL DATA:  Acute abdominal pain. EXAM: CT ABDOMEN AND PELVIS WITHOUT CONTRAST TECHNIQUE: Multidetector CT imaging of the abdomen and pelvis was performed following the standard protocol without IV contrast. RADIATION DOSE REDUCTION: This exam was performed according to the departmental dose-optimization program which includes automated exposure control, adjustment of the mA and/or kV according to patient size and/or use of iterative reconstruction technique. COMPARISON:  CT 12/23/2022 FINDINGS: Lower chest: Stable cardiac enlargement. No pericardial effusion. No pleural effusions or pulmonary lesions. Hepatobiliary: Stable mild cirrhotic changes involving the liver but no hepatic lesions or intrahepatic biliary dilatation. Gallbladder is surgically absent. No common bile duct dilatation. Pancreas: No mass, inflammation or ductal dilatation. Spleen: Normal size.  No focal lesions. Adrenals/Urinary Tract: The adrenal glands are unremarkable and stable. 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 No renal calculi or hydroureteronephrosis. The bladder is unremarkable. Stomach/Bowel: Small hiatal hernia. The stomach is otherwise unremarkable. The duodenum, small bowel and colon are grossly normal. The terminal ileum and appendix are normal. Vascular/Lymphatic: Scattered atherosclerotic calcifications involving the aorta but no aneurysm. No mesenteric or retroperitoneal mass or adenopathy. Reproductive: The uterus is surgically absent. Both ovaries are still present and appear normal. Other: No pelvic mass or adenopathy. No free pelvic fluid collections. No inguinal mass or  adenopathy. No abdominal wall hernia or subcutaneous lesions. Musculoskeletal: No significant bony findings. IMPRESSION: 1. No acute abdominal/pelvic findings, mass lesions or adenopathy. 2. Stable mild cirrhotic changes involving the liver but no hepatic lesions or intrahepatic biliary dilatation. 3. Status post cholecystectomy. No biliary dilatation. 4. 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 5. Cardiac enlargement. Aortic Atherosclerosis (ICD10-I70.0). Electronically Signed   By: Rudie Meyer M.D.   On: 01/11/2023 15:04   DG Chest 2 View  Result Date: 01/11/2023 CLINICAL DATA:  Chest pain. EXAM: CHEST - 2 VIEW COMPARISON:  Chest radiographs 12/23/2022,  11/07/2022 FINDINGS: Dual-lumen right internal jugular central venous catheter tips again overlie the superior right atrium junction. Status post median sternotomy. Cardiac silhouette is again moderately enlarged. Postsurgical changes of left atrial appendage clipping and tricuspid and mitral valve valvuloplasty. Mediastinal contours are within normal limits. Moderate calcification within aortic arch. Mildly decreased lung volumes. Mild bilateral interstitial thickening is similar to prior. No pleural effusion or pneumothorax. No acute skeletal abnormality.  Cholecystectomy clips. IMPRESSION: 1. Mild interstitial thickening, similar to prior, which may represent mild interstitial pulmonary edema. 2. Unchanged cardiomegaly. Electronically Signed   By: Neita Garnet M.D.   On: 01/11/2023 13:03    Procedures Procedures    Medications Ordered in ED Medications  ondansetron (ZOFRAN-ODT) disintegrating tablet 8 mg (8 mg Oral Given 01/11/23 1404)    ED Course/ Medical Decision Making/ A&P                             Medical Decision Making Adult female with risk profile for intrathoracic or intraperitoneal abnormalities including dialysis, hypertension presents with nausea, vomiting, left-sided pain.  Patient is awake, alert, no hypotension,  no hypoxia, but is minimally febrile. Broad differential including infection, pneumonia included, viral process, intra-abdominal infection, though the patient has a nonperitoneal abdomen, and the right side is nontender, no evidence of peritonitis.   Amount and/or Complexity of Data Reviewed External Data Reviewed: notes. Labs: ordered. Decision-making details documented in ED Course. Radiology: ordered and independent interpretation performed. Decision-making details documented in ED Course. ECG/medicine tests: ordered and independent interpretation performed. Decision-making details documented in ED Course.  Risk Prescription drug management. Decision regarding hospitalization.   3:56 PM Patient in no distress, awake, alert, speaking clearly.  I reviewed her CT scan, labs, discussed them with her and her husband previously. No notable findings, labs consistent with patient with known end-stage renal disease, no substantial electrolyte abnormalities, CT without alarming findings. No evidence for pneumonia on x-ray. Patient is awake, alert, and will follow-up with primary care.        Final Clinical Impression(s) / ED Diagnoses Final diagnoses:  Nausea  Generalized abdominal pain    Rx / DC Orders ED Discharge Orders          Ordered    ondansetron (ZOFRAN-ODT) 4 MG disintegrating tablet  Every 8 hours PRN        01/11/23 1555              Gerhard Munch, MD 01/11/23 1557

## 2023-01-11 NOTE — ED Triage Notes (Signed)
Finished her treatment at HD had N/V since woke up and  then decided after HD she wanted to come here also started with CP after the treatment ended to the epigastric area radiating to the LLQ

## 2023-01-11 NOTE — ED Notes (Signed)
Patient transported to X-ray 

## 2023-01-12 ENCOUNTER — Ambulatory Visit (HOSPITAL_COMMUNITY)
Admission: RE | Admit: 2023-01-12 | Discharge: 2023-01-12 | Disposition: A | Discharge status: Home / Self Care | Payer: 59 | Attending: Gastroenterology | Admitting: Gastroenterology

## 2023-01-12 ENCOUNTER — Ambulatory Visit (HOSPITAL_COMMUNITY): Payer: 59 | Admitting: Anesthesiology

## 2023-01-12 ENCOUNTER — Encounter (HOSPITAL_COMMUNITY)
Admission: RE | Disposition: A | Discharge status: Home / Self Care | Payer: Self-pay | Source: Home / Self Care | Attending: Gastroenterology

## 2023-01-12 ENCOUNTER — Encounter (HOSPITAL_COMMUNITY): Payer: Self-pay | Admitting: Gastroenterology

## 2023-01-12 ENCOUNTER — Ambulatory Visit (HOSPITAL_BASED_OUTPATIENT_CLINIC_OR_DEPARTMENT_OTHER): Payer: 59 | Admitting: Anesthesiology

## 2023-01-12 DIAGNOSIS — I4891 Unspecified atrial fibrillation: Secondary | ICD-10-CM | POA: Diagnosis not present

## 2023-01-12 DIAGNOSIS — K449 Diaphragmatic hernia without obstruction or gangrene: Secondary | ICD-10-CM | POA: Insufficient documentation

## 2023-01-12 DIAGNOSIS — K219 Gastro-esophageal reflux disease without esophagitis: Secondary | ICD-10-CM | POA: Insufficient documentation

## 2023-01-12 DIAGNOSIS — I110 Hypertensive heart disease with heart failure: Secondary | ICD-10-CM | POA: Diagnosis not present

## 2023-01-12 DIAGNOSIS — R112 Nausea with vomiting, unspecified: Secondary | ICD-10-CM | POA: Diagnosis not present

## 2023-01-12 DIAGNOSIS — G4730 Sleep apnea, unspecified: Secondary | ICD-10-CM

## 2023-01-12 DIAGNOSIS — N189 Chronic kidney disease, unspecified: Secondary | ICD-10-CM | POA: Diagnosis not present

## 2023-01-12 DIAGNOSIS — E785 Hyperlipidemia, unspecified: Secondary | ICD-10-CM | POA: Insufficient documentation

## 2023-01-12 DIAGNOSIS — I5042 Chronic combined systolic (congestive) and diastolic (congestive) heart failure: Secondary | ICD-10-CM | POA: Insufficient documentation

## 2023-01-12 DIAGNOSIS — R1013 Epigastric pain: Secondary | ICD-10-CM | POA: Diagnosis present

## 2023-01-12 DIAGNOSIS — I509 Heart failure, unspecified: Secondary | ICD-10-CM | POA: Diagnosis not present

## 2023-01-12 DIAGNOSIS — K3189 Other diseases of stomach and duodenum: Secondary | ICD-10-CM

## 2023-01-12 DIAGNOSIS — E1122 Type 2 diabetes mellitus with diabetic chronic kidney disease: Secondary | ICD-10-CM | POA: Insufficient documentation

## 2023-01-12 DIAGNOSIS — Z86718 Personal history of other venous thrombosis and embolism: Secondary | ICD-10-CM | POA: Diagnosis not present

## 2023-01-12 DIAGNOSIS — K2900 Acute gastritis without bleeding: Secondary | ICD-10-CM | POA: Diagnosis not present

## 2023-01-12 DIAGNOSIS — J449 Chronic obstructive pulmonary disease, unspecified: Secondary | ICD-10-CM | POA: Insufficient documentation

## 2023-01-12 DIAGNOSIS — I132 Hypertensive heart and chronic kidney disease with heart failure and with stage 5 chronic kidney disease, or end stage renal disease: Secondary | ICD-10-CM | POA: Diagnosis not present

## 2023-01-12 DIAGNOSIS — Z6841 Body Mass Index (BMI) 40.0 and over, adult: Secondary | ICD-10-CM | POA: Diagnosis not present

## 2023-01-12 DIAGNOSIS — I700 Atherosclerosis of aorta: Secondary | ICD-10-CM | POA: Diagnosis not present

## 2023-01-12 DIAGNOSIS — Z992 Dependence on renal dialysis: Secondary | ICD-10-CM | POA: Insufficient documentation

## 2023-01-12 DIAGNOSIS — Z9049 Acquired absence of other specified parts of digestive tract: Secondary | ICD-10-CM | POA: Diagnosis not present

## 2023-01-12 HISTORY — PX: ESOPHAGOGASTRODUODENOSCOPY (EGD) WITH PROPOFOL: SHX5813

## 2023-01-12 HISTORY — PX: BIOPSY: SHX5522

## 2023-01-12 LAB — POCT I-STAT, CHEM 8
BUN: 30 mg/dL — ABNORMAL HIGH (ref 8–23)
Calcium, Ion: 1.11 mmol/L — ABNORMAL LOW (ref 1.15–1.40)
Chloride: 97 mmol/L — ABNORMAL LOW (ref 98–111)
Creatinine, Ser: 7.5 mg/dL — ABNORMAL HIGH (ref 0.44–1.00)
Glucose, Bld: 116 mg/dL — ABNORMAL HIGH (ref 70–99)
HCT: 39 % (ref 36.0–46.0)
Hemoglobin: 13.3 g/dL (ref 12.0–15.0)
Potassium: 4.9 mmol/L (ref 3.5–5.1)
Sodium: 136 mmol/L (ref 135–145)
TCO2: 32 mmol/L (ref 22–32)

## 2023-01-12 SURGERY — ESOPHAGOGASTRODUODENOSCOPY (EGD) WITH PROPOFOL
Anesthesia: Monitor Anesthesia Care

## 2023-01-12 MED ORDER — SODIUM CHLORIDE 0.9 % IV SOLN: INTRAVENOUS | Status: DC

## 2023-01-12 MED ORDER — PHENYLEPHRINE 80 MCG/ML (10ML) SYRINGE FOR IV PUSH (FOR BLOOD PRESSURE SUPPORT)
PREFILLED_SYRINGE | INTRAVENOUS | Status: DC | PRN
  Administered 2023-01-12: 160 ug via INTRAVENOUS

## 2023-01-12 MED ORDER — ONDANSETRON HCL 4 MG/2ML IJ SOLN
4.0000 mg | Freq: Once | INTRAMUSCULAR | Status: AC
  Administered 2023-01-12: 4 mg via INTRAVENOUS

## 2023-01-12 MED ORDER — ONDANSETRON HCL 4 MG/2ML IJ SOLN
INTRAMUSCULAR | Status: AC
  Filled 2023-01-12: qty 2

## 2023-01-12 MED ORDER — PROPOFOL 10 MG/ML IV BOLUS
INTRAVENOUS | Status: DC | PRN
  Administered 2023-01-12 (×2): 20 mg via INTRAVENOUS

## 2023-01-12 MED ORDER — PROPOFOL 1000 MG/100ML IV EMUL
INTRAVENOUS | Status: AC
  Filled 2023-01-12: qty 400

## 2023-01-12 MED ORDER — PROPOFOL 500 MG/50ML IV EMUL
INTRAVENOUS | Status: AC
  Filled 2023-01-12: qty 150

## 2023-01-12 MED ORDER — LIDOCAINE 2% (20 MG/ML) 5 ML SYRINGE
INTRAMUSCULAR | Status: DC | PRN
  Administered 2023-01-12: 80 mg via INTRAVENOUS

## 2023-01-12 MED ORDER — PROPOFOL 500 MG/50ML IV EMUL
INTRAVENOUS | Status: DC | PRN
  Administered 2023-01-12: 125 ug/kg/min via INTRAVENOUS

## 2023-01-12 SURGICAL SUPPLY — 15 items

## 2023-01-12 NOTE — Transfer of Care (Signed)
Immediate Anesthesia Transfer of Care Note  Patient: [name]  Procedure(s) Performed: ESOPHAGOGASTRODUODENOSCOPY (EGD) WITH PROPOFOL BIOPSY  Patient Location: PACU  Anesthesia Type:MAC  Level of Consciousness: sedated, patient cooperative, and responds to stimulation  Airway & Oxygen Therapy: Patient Spontanous Breathing and Patient connected to face mask oxygen  Post-op Assessment: Report given to RN and Post -op Vital signs reviewed and stable  Post vital signs: Reviewed and stable  Last Vitals:  Vitals Value Taken Time  BP 90/31 01/12/23 0917  Temp    Pulse 70 01/12/23 0919  Resp 18 01/12/23 0919  SpO2 100 % 01/12/23 0919  Vitals shown include unvalidated device data.  Last Pain:  Vitals:   01/12/23 0753  TempSrc: Temporal  PainSc: 9          Complications: No notable events documented.

## 2023-01-12 NOTE — Op Note (Signed)
[facility] Patient Name: [name] Procedure Date: 01/12/2023 MRN: [id number] Attending MD: Jeani Hawking , MD, [id number] Date of Birth: [birth year] CSN: [id number] Age: 67 Admit Type: Outpatient Procedure:                Upper GI endoscopy Indications:              Epigastric abdominal pain, Nausea with vomiting Providers:                Jeani Hawking, MD, Pollie Friar RN, RN, Irene Shipper, Technician, Mirian Mo, CRNA, [facility], CRNA Referring MD:              Medicines:                Propofol per Anesthesia Complications:            No immediate complications. Estimated Blood Loss:     Estimated blood loss: none. Procedure:                Pre-Anesthesia Assessment:                           - Prior to the procedure, a History and Physical                            was performed, and patient medications and                            allergies were reviewed. The patient's tolerance of                            previous anesthesia was also reviewed. The risks                            and benefits of the procedure and the sedation                            options and risks were discussed with the patient.                            All questions were answered, and informed consent                            was obtained. Prior Anticoagulants: The patient has                            taken no anticoagulant or antiplatelet agents. ASA                            Grade Assessment: III - A patient with severe  systemic disease. After reviewing the risks and                            benefits, the patient was deemed in satisfactory                            condition to undergo the procedure.                           - Sedation was administered by an anesthesia                            professional. Deep sedation was attained.                           After  obtaining informed consent, the endoscope was                            passed under direct vision. Throughout the                            procedure, the patient's blood pressure, pulse, and                            oxygen saturations were monitored continuously. The                            [id number]-[id number] ([id number] Olympus endoscope was introduced                            through the mouth, and advanced to the second part                            of duodenum. The upper GI endoscopy was                            accomplished without difficulty. The patient                            tolerated the procedure well. Scope In: Scope Out: Findings:      The esophagus was normal.      Patchy mildly erythematous mucosa without bleeding was found in the       gastric fundus and in the gastric antrum. Biopsies were taken with a       cold forceps for Helicobacter pylori testing.      Patchy mildly erythematous mucosa without active bleeding and with no       stigmata of bleeding was found in the duodenal bulb. Impression:               - Normal esophagus.                           - Erythematous mucosa in the gastric fundus and  antrum. Biopsied.                           - Erythematous duodenopathy. Moderate Sedation:      Not Applicable - Patient had care per Anesthesia. Recommendation:           - Patient has a contact number available for                            emergencies. The signs and symptoms of potential                            delayed complications were discussed with the                            patient. Return to normal activities tomorrow.                            Written discharge instructions were provided to the                            patient.                           - Resume previous diet.                           - Continue present medications.                           - Await pathology results.                           -  Return to GI clinic in 4 weeks. Procedure Code(s):        --- Professional ---                           [phone], Esophagogastroduodenoscopy, flexible,                            transoral; with biopsy, single or multiple Diagnosis Code(s):        --- Professional ---                           K31.89, Other diseases of stomach and duodenum                           R10.13, Epigastric pain                           R11.2, Nausea with vomiting, unspecified CPT copyright 2022 [employer]. All rights reserved. The codes documented in this report are preliminary and upon coder review may  be revised to meet current compliance requirements. Jeani Hawking, MD Jeani Hawking, MD 01/12/2023 9:19:23 AM This report has been signed electronically. Number of Addenda: 0

## 2023-01-12 NOTE — Discharge Instructions (Signed)
YOU HAD AN ENDOSCOPIC PROCEDURE TODAY: Refer to the procedure report and other information in the discharge instructions given to you for any specific questions about what was found during the examination. If this information does not answer your questions, please call [facility] at [phone] to clarify.  ° °YOU SHOULD EXPECT: Some feelings of bloating in the abdomen. Passage of more gas than usual. Walking can help get rid of the air that was put into your GI tract during the procedure and reduce the bloating. If you had a lower endoscopy (such as a colonoscopy or flexible sigmoidoscopy) you may notice spotting of blood in your stool or on the toilet paper. Some abdominal soreness may be present for a day or two, also. ° °DIET: Your first meal following the procedure should be a light meal and then it is ok to progress to your normal diet. A half-sandwich or bowl of soup is an example of a good first meal. Heavy or fried foods are harder to digest and may make you feel nauseous or bloated. Drink plenty of fluids but you should avoid alcoholic beverages for 24 hours. If you had an esophageal dilation, please see attached information for diet.  ° °ACTIVITY: Your care partner should take you home directly after the procedure. You should plan to take it easy, moving slowly for the rest of the day. You can resume normal activity the day after the procedure however YOU SHOULD NOT DRIVE, use power tools, machinery or perform tasks that involve climbing or major physical exertion for 24 hours (because of the sedation medicines used during the test).  ° °SYMPTOMS TO REPORT IMMEDIATELY: °A gastroenterologist can be reached at any hour. Please call [phone]  for any of the following symptoms:  °Following lower endoscopy (colonoscopy, flexible sigmoidoscopy) °Excessive amounts of blood in the stool  °Significant tenderness, worsening of abdominal pains  °Swelling of the abdomen that is new, acute  °Fever of  100° or higher  °Following upper endoscopy (EGD, EUS, ERCP, esophageal dilation) °Vomiting of blood or coffee ground material  °New, significant abdominal pain  °New, significant chest pain or pain under the shoulder blades  °Painful or persistently difficult swallowing  °New shortness of breath  °Black, tarry-looking or red, bloody stools ° °FOLLOW UP:  °If any biopsies were taken you will be contacted by phone or by letter within the next 1-3 weeks. Call [phone]  if you have not heard about the biopsies in 3 weeks.  °Please also call with any specific questions about appointments or follow up tests. ° °

## 2023-01-12 NOTE — Anesthesia Preprocedure Evaluation (Signed)
Anesthesia Evaluation  Patient identified by MRN, date of birth, ID band Patient awake    Reviewed: Allergy & Precautions, H&P , NPO status , Patient's Chart, lab work & pertinent test results  Airway Mallampati: II  TM Distance: >3 FB Neck ROM: Full    Dental no notable dental hx. (+) Missing   Pulmonary sleep apnea    Pulmonary exam normal breath sounds clear to auscultation       Cardiovascular hypertension, +CHF  Normal cardiovascular exam Rhythm:Regular Rate:Normal  1. Abnormal septal motion . Left ventricular ejection fraction, by  estimation, is 50 to 55%. The left ventricle has low normal function. The  left ventricle has no regional wall motion abnormalities.   2. Right ventricular systolic function is normal. The right ventricular  size is normal.   3. Left atrial size was severely dilated.   4. Right atrial size was severely dilated.   5. Post repair with annuloplasty ring no doppler/color flow done. The  mitral valve was not assessed. No evidence of mitral valve regurgitation.  No evidence of mitral stenosis.   6. Post repair with ring no doppler/color flow done. Tricuspid valve  regurgitation is not demonstrated.   7. The aortic valve was not assessed. Aortic valve regurgitation is not  visualized. No aortic stenosis is present.   8. The inferior vena cava is normal in size with greater than 50%  respiratory variability, suggesting right atrial     Neuro/Psych negative neurological ROS  negative psych ROS   GI/Hepatic negative GI ROS, Neg liver ROS,,,  Endo/Other  diabetes  Morbid obesity  Renal/GU DialysisRenal disease  negative genitourinary   Musculoskeletal negative musculoskeletal ROS (+)    Abdominal  (+) + obese  Peds negative pediatric ROS (+)  Hematology negative hematology ROS (+)   Anesthesia Other Findings   Reproductive/Obstetrics negative OB ROS                              Anesthesia Physical Anesthesia Plan  ASA: 4  Anesthesia Plan: MAC   Post-op Pain Management: Minimal or no pain anticipated   Induction: Intravenous  PONV Risk Score and Plan: Propofol infusion and Treatment may vary due to age or medical condition  Airway Management Planned: Nasal Cannula  Additional Equipment:   Intra-op Plan:   Post-operative Plan:   Informed Consent: I have reviewed the patients History and Physical, chart, labs and discussed the procedure including the risks, benefits and alternatives for the proposed anesthesia with the patient or authorized representative who has indicated his/her understanding and acceptance.     Dental advisory given  Plan Discussed with: CRNA and Surgeon  Anesthesia Plan Comments:        Anesthesia Quick Evaluation

## 2023-01-12 NOTE — H&P (Signed)
[name] HPI: For the past month she reports an acute onset of epigastric pain, nausea, vomiting, and weight loss.  Two months prior she started on hemodialysis on a TTS schedule.  The patient reports an early satiety sensation and it is common for her to vomit her food up.  As a result of her symptoms she decreased her PO intake and she lost weight.  She does not know how much weight she lost, but it was a significant amount.  Over the years she gained a significant amount of weight compared to the office value of 264 lbs in 2021.  In 2011 she was under the care of Dr. Juanda Chance and an EGD was performed, which was essentially normal.  A GES was performed and it was read as markedly delayed, however, it was only a 2 hour GES.  In the ER on 10/23/2022 her blood work did not show any acute abnormalities.  The BNP was elevated at 362, but she tends to have some level of CHF.  Her last echo was in 02/2022 and it showed severe biatrial enlargement and an EF of 50-55% with abnormal septal motion.  She had a routine afib cardiac office follow up and her afib was stable.  Past Medical History:  Diagnosis Date   Acute on chronic systolic CHF (congestive heart failure) (HCC) 12/21/2020   Allergic rhinitis    Anemia    Arthritis    Asthma    Chronic diastolic CHF (congestive heart failure) (HCC)    Chronic kidney disease    Dialysis Tu/TH/Sa   COPD (chronic obstructive pulmonary disease) (HCC)    Depression    DM (diabetes mellitus) (HCC)    DVT (deep vein thrombosis) in pregnancy    GERD (gastroesophageal reflux disease)    HTN (hypertension)    Hyperlipidemia    Obesity    Pneumonia 04/2018   RIGHT LOBE   Sleep apnea    needs to be retested - to get new cpap    Past Surgical History:  Procedure Laterality Date   ABDOMINAL HYSTERECTOMY     AV FISTULA PLACEMENT Left 03/17/2022   Procedure: LEFT ARM ARTERIOVENOUS (AV) FISTULA CREATION;  Surgeon: Chuck Hint, MD;  Location: [facility] OR;   Service: Vascular;  Laterality: Left;   BUBBLE STUDY  11/26/2020   Procedure: BUBBLE STUDY;  Surgeon: Jodelle Red, MD;  Location: [facility] ENDOSCOPY;  Service: Cardiovascular;;   CARDIOVERSION N/A 05/06/2020   Procedure: CARDIOVERSION;  Surgeon: Jodelle Red, MD;  Location: [facility] ENDOSCOPY;  Service: Cardiovascular;  Laterality: N/A;   CARDIOVERSION N/A 11/04/2020   Procedure: CARDIOVERSION;  Surgeon: Lewayne Bunting, MD;  Location: [facility] ENDOSCOPY;  Service: Cardiovascular;  Laterality: N/A;   CARDIOVERSION N/A 02/01/2021   Procedure: CARDIOVERSION;  Surgeon: Dolores Patty, MD;  Location: [facility] ENDOSCOPY;  Service: Cardiovascular;  Laterality: N/A;   CARDIOVERSION N/A 02/09/2021   Procedure: CARDIOVERSION;  Surgeon: Dolores Patty, MD;  Location: [facility] ENDOSCOPY;  Service: Cardiovascular;  Laterality: N/A;   CARDIOVERSION N/A 04/19/2021   Procedure: CARDIOVERSION;  Surgeon: Pricilla Riffle, MD;  Location: [facility] ENDOSCOPY;  Service: Cardiovascular;  Laterality: N/A;   CARDIOVERSION N/A 11/25/2021   Procedure: CARDIOVERSION;  Surgeon: Dolores Patty, MD;  Location: [facility] ENDOSCOPY;  Service: Cardiovascular;  Laterality: N/A;   CARDIOVERSION N/A 01/31/2022   Procedure: CARDIOVERSION;  Surgeon: Dolores Patty, MD;  Location: [facility] ENDOSCOPY;  Service: Cardiovascular;  Laterality: N/A;   CATARACT EXTRACTION, BILATERAL     CHOLECYSTECTOMY  COLONOSCOPY WITH PROPOFOL N/A 03/05/2015   Procedure: COLONOSCOPY WITH PROPOFOL;  Surgeon: Jeani Hawking, MD;  Location: [facility] ENDOSCOPY;  Service: Endoscopy;  Laterality: N/A;   DIAGNOSTIC LAPAROSCOPY     FISTULA SUPERFICIALIZATION Left 03/17/2022   Procedure: FISTULA SUPERFICIALIZATION -LEFT;  Surgeon: Chuck Hint, MD;  Location: [facility] OR;  Service: Vascular;  Laterality: Left;   ingrown hallux Left    IR FLUORO GUIDE CV LINE RIGHT  03/14/2022   IR US GUIDE VASC ACCESS RIGHT  03/14/2022   KNEE SURGERY     LEFT HEART CATH AND CORONARY ANGIOGRAPHY  N/A 12/31/2017   Procedure: LEFT HEART CATH AND CORONARY ANGIOGRAPHY;  Surgeon: Rinaldo Cloud, MD;  Location: [facility] INVASIVE CV LAB;  Service: Cardiovascular;  Laterality: N/A;   MAZE N/A 12/29/2020   Procedure: MAZE;  Surgeon: Loreli Slot, MD;  Location: [facility] OR;  Service: Open Heart Surgery;  Laterality: N/A;   MITRAL VALVE REPAIR N/A 12/29/2020   Procedure: MITRAL VALVE REPAIR USING CARBOMEDICS ANNULOFLEX RING SIZE 30;  Surgeon: Loreli Slot, MD;  Location: [facility] OR;  Service: Open Heart Surgery;  Laterality: N/A;   REVISON OF ARTERIOVENOUS FISTULA Left 01/05/2023   Procedure: REVISION OF LEFT ARTERIOVENOUS FISTULA WITH INTERPOSITION PTFE GRAFT;  Surgeon: Chuck Hint, MD;  Location: [facility] OR;  Service: Vascular;  Laterality: Left;  ELIQUIS AND HOLD 48 X HOURS   RIGHT HEART CATH N/A 12/22/2020   Procedure: RIGHT HEART CATH;  Surgeon: Laurey Morale, MD;  Location: [facility] INVASIVE CV LAB;  Service: Cardiovascular;  Laterality: N/A;   RIGHT HEART CATH N/A 01/31/2021   Procedure: RIGHT HEART CATH;  Surgeon: Dolores Patty, MD;  Location: [facility] INVASIVE CV LAB;  Service: Cardiovascular;  Laterality: N/A;   RIGHT HEART CATH N/A 07/29/2021   Procedure: RIGHT HEART CATH;  Surgeon: Dolores Patty, MD;  Location: [facility] INVASIVE CV LAB;  Service: Cardiovascular;  Laterality: N/A;   RIGHT HEART CATH N/A 11/24/2021   Procedure: RIGHT HEART CATH;  Surgeon: Dolores Patty, MD;  Location: [facility] INVASIVE CV LAB;  Service: Cardiovascular;  Laterality: N/A;   RIGHT HEART CATH N/A 02/14/2022   Procedure: RIGHT HEART CATH;  Surgeon: Dolores Patty, MD;  Location: [facility] INVASIVE CV LAB;  Service: Cardiovascular;  Laterality: N/A;   RIGHT HEART CATH N/A 02/22/2022   Procedure: RIGHT HEART CATH;  Surgeon: Laurey Morale, MD;  Location: [facility] INVASIVE CV LAB;  Service: Cardiovascular;  Laterality: N/A;   TEE WITHOUT CARDIOVERSION N/A 11/26/2020   Procedure: TRANSESOPHAGEAL ECHOCARDIOGRAM (TEE);  Surgeon:  Jodelle Red, MD;  Location: [facility] ENDOSCOPY;  Service: Cardiovascular;  Laterality: N/A;   TEE WITHOUT CARDIOVERSION N/A 12/29/2020   Procedure: TRANSESOPHAGEAL ECHOCARDIOGRAM (TEE);  Surgeon: Loreli Slot, MD;  Location: [facility] OR;  Service: Open Heart Surgery;  Laterality: N/A;   TEE WITHOUT CARDIOVERSION N/A 01/20/2022   Procedure: TRANSESOPHAGEAL ECHOCARDIOGRAM (TEE);  Surgeon: Dolores Patty, MD;  Location: [facility] ENDOSCOPY;  Service: Cardiovascular;  Laterality: N/A;   TRICUSPID VALVE REPLACEMENT N/A 12/29/2020   Procedure: 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 RING SIZE 34;  Surgeon: Loreli Slot, MD;  Location: [facility] OR;  Service: Open Heart Surgery;  Laterality: N/A;    Family History  Problem Relation Age of Onset   Breast cancer Mother    Cancer Mother    Hypertension Mother    Cancer Father    Hypertension Sister    Hypertension Brother    CAD Other     Social  History:  reports that she has never smoked. She has never been exposed to tobacco smoke. She has never used smokeless tobacco. She reports that she does not drink alcohol and does not use drugs.  Allergies:  Allergies  Allergen Reactions   Bee Pollen Anaphylaxis and Other (See Comments)    UNCONFIRMED, but IS allergic to bee VENOM   Bee Venom Anaphylaxis   Sulfa Antibiotics Itching   Iodinated Contrast Media Nausea And Vomiting    Medications: Scheduled: Continuous:  sodium chloride      Results for orders placed or performed during the hospital encounter of 01/12/23 (from the past 24 hour(s))  I-STAT, chem 8     Status: Abnormal   Collection Time: 01/12/23  8:29 AM  Result Value Ref Range   Sodium 136 135 - 145 mmol/L   Potassium 4.9 3.5 - 5.1 mmol/L   Chloride 97 (L) 98 - 111 mmol/L   BUN 30 (H) 8 - 23 mg/dL   Creatinine, Ser 1.61 (H) 0.44 - 1.00 mg/dL   Glucose, Bld 096 (H) 70 - 99 mg/dL   Calcium, Ion 0.45 (L) 1.15 - 1.40 mmol/L   TCO2 32 22 - 32 mmol/L   Hemoglobin  13.3 12.0 - 15.0 g/dL   HCT 40.9 81.1 - 91.4 %   *Note: Due to a large number of results and/or encounters for the requested time period, some results have not been displayed. A complete set of results can be found in Results Review.     CT ABDOMEN PELVIS WO CONTRAST  Result Date: 01/11/2023 CLINICAL DATA:  Acute abdominal pain. EXAM: CT ABDOMEN AND PELVIS WITHOUT CONTRAST TECHNIQUE: Multidetector CT imaging of the abdomen and pelvis was performed following the standard protocol without IV contrast. RADIATION DOSE REDUCTION: This exam was performed according to the departmental dose-optimization program which includes automated exposure control, adjustment of the mA and/or kV according to patient size and/or use of iterative reconstruction technique. COMPARISON:  CT 12/23/2022 FINDINGS: Lower chest: Stable cardiac enlargement. No pericardial effusion. No pleural effusions or pulmonary lesions. Hepatobiliary: Stable mild cirrhotic changes involving the liver but no hepatic lesions or intrahepatic biliary dilatation. Gallbladder is surgically absent. No common bile duct dilatation. Pancreas: No mass, inflammation or ductal dilatation. Spleen: Normal size.  No focal lesions. Adrenals/Urinary Tract: The adrenal glands are unremarkable and stable. 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 No renal calculi or hydroureteronephrosis. The bladder is unremarkable. Stomach/Bowel: Small hiatal hernia. The stomach is otherwise unremarkable. The duodenum, small bowel and colon are grossly normal. The terminal ileum and appendix are normal. Vascular/Lymphatic: Scattered atherosclerotic calcifications involving the aorta but no aneurysm. No mesenteric or retroperitoneal mass or adenopathy. Reproductive: The uterus is surgically absent. Both ovaries are still present and appear normal. Other: No pelvic mass or adenopathy. No free pelvic fluid collections. No inguinal mass or adenopathy. No abdominal wall  hernia or subcutaneous lesions. Musculoskeletal: No significant bony findings. IMPRESSION: 1. No acute abdominal/pelvic findings, mass lesions or adenopathy. 2. Stable mild cirrhotic changes involving the liver but no hepatic lesions or intrahepatic biliary dilatation. 3. Status post cholecystectomy. No biliary dilatation. 4. 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 5. Cardiac enlargement. Aortic Atherosclerosis (ICD10-I70.0). Electronically Signed   By: Rudie Meyer M.D.   On: 01/11/2023 15:04   DG Chest 2 View  Result Date: 01/11/2023 CLINICAL DATA:  Chest pain. EXAM: CHEST - 2 VIEW COMPARISON:  Chest radiographs 12/23/2022, 11/07/2022 FINDINGS: Dual-lumen right internal jugular central venous catheter tips  again overlie the superior right atrium junction. Status post median sternotomy. Cardiac silhouette is again moderately enlarged. Postsurgical changes of left atrial appendage clipping and tricuspid and mitral valve valvuloplasty. Mediastinal contours are within normal limits. Moderate calcification within aortic arch. Mildly decreased lung volumes. Mild bilateral interstitial thickening is similar to prior. No pleural effusion or pneumothorax. No acute skeletal abnormality.  Cholecystectomy clips. IMPRESSION: 1. Mild interstitial thickening, similar to prior, which may represent mild interstitial pulmonary edema. 2. Unchanged cardiomegaly. Electronically Signed   By: Neita Garnet M.D.   On: 01/11/2023 13:03    ROS:  As stated above in the HPI otherwise negative.  Blood pressure 126/67, pulse 87, temperature (!) 97 F (36.1 C), temperature source Temporal, resp. rate 15, height 5\' 7"  (1.702 m), weight 125.2 kg, SpO2 99 %.    PE: Gen: Uncomfortable Lungs: CTA Bilaterally CV: RRR without M/G/R ABD: Soft, epigastric tenderness, +BS Ext: No C/C/E  Assessment/Plan: 1) Nausea/vomiting. 2) Epigastric pain. 3) Weight loss.  Plan: 1) EGD.  Tarri Guilfoil D 01/12/2023, 8:47 AM

## 2023-01-12 NOTE — Anesthesia Postprocedure Evaluation (Signed)
Anesthesia Post Note  Patient: [name]  Procedure(s) Performed: ESOPHAGOGASTRODUODENOSCOPY (EGD) WITH PROPOFOL BIOPSY     Patient location during evaluation: PACU Anesthesia Type: MAC Level of consciousness: awake and alert Pain management: pain level controlled Vital Signs Assessment: post-procedure vital signs reviewed and stable Respiratory status: spontaneous breathing, nonlabored ventilation, respiratory function stable and patient connected to nasal cannula oxygen Cardiovascular status: stable and blood pressure returned to baseline Postop Assessment: no apparent nausea or vomiting Anesthetic complications: no  No notable events documented.  Last Vitals:  Vitals:   01/12/23 0950 01/12/23 1000  BP: (!) 114/53 127/81  Pulse: 82 81  Resp: (!) 21 (!) 22  Temp:    SpO2: 100% 100%    Last Pain:  Vitals:   01/12/23 1000  TempSrc:   PainSc: 0-No pain                 Kodi Steil S

## 2023-01-15 ENCOUNTER — Encounter (HOSPITAL_COMMUNITY): Payer: Self-pay | Admitting: Gastroenterology

## 2023-01-17 NOTE — Telephone Encounter (Signed)
Appt has been scheduled.

## 2023-01-18 LAB — SURGICAL PATHOLOGY

## 2023-01-31 ENCOUNTER — Ambulatory Visit: Payer: 59 | Admitting: Family Medicine

## 2023-02-07 ENCOUNTER — Other Ambulatory Visit (HOSPITAL_COMMUNITY): Payer: Self-pay | Admitting: Gastroenterology

## 2023-02-07 DIAGNOSIS — R110 Nausea: Secondary | ICD-10-CM

## 2023-02-09 ENCOUNTER — Other Ambulatory Visit: Payer: Self-pay | Admitting: *Deleted

## 2023-02-09 DIAGNOSIS — N186 End stage renal disease: Secondary | ICD-10-CM

## 2023-02-11 ENCOUNTER — Other Ambulatory Visit (INDEPENDENT_AMBULATORY_CARE_PROVIDER_SITE_OTHER): Payer: Self-pay | Admitting: Primary Care

## 2023-02-11 DIAGNOSIS — Z760 Encounter for issue of repeat prescription: Secondary | ICD-10-CM

## 2023-02-11 DIAGNOSIS — E1142 Type 2 diabetes mellitus with diabetic polyneuropathy: Secondary | ICD-10-CM

## 2023-02-11 DIAGNOSIS — F32A Depression, unspecified: Secondary | ICD-10-CM

## 2023-02-12 NOTE — Telephone Encounter (Signed)
Will forward to provider  

## 2023-02-16 ENCOUNTER — Ambulatory Visit (HOSPITAL_COMMUNITY)
Admission: RE | Admit: 2023-02-16 | Discharge: 2023-02-16 | Disposition: A | Discharge status: Home / Self Care | Payer: 59 | Source: Ambulatory Visit | Attending: Gastroenterology | Admitting: Gastroenterology

## 2023-02-16 ENCOUNTER — Encounter (HOSPITAL_COMMUNITY): Payer: Self-pay

## 2023-02-16 DIAGNOSIS — R110 Nausea: Secondary | ICD-10-CM | POA: Insufficient documentation

## 2023-02-21 ENCOUNTER — Ambulatory Visit (HOSPITAL_COMMUNITY)
Admission: RE | Admit: 2023-02-21 | Discharge: 2023-02-21 | Disposition: A | Discharge status: Home / Self Care | Payer: 59 | Source: Ambulatory Visit | Attending: Vascular Surgery | Admitting: Vascular Surgery

## 2023-02-21 ENCOUNTER — Other Ambulatory Visit (INDEPENDENT_AMBULATORY_CARE_PROVIDER_SITE_OTHER): Payer: Self-pay | Admitting: Family Medicine

## 2023-02-21 ENCOUNTER — Ambulatory Visit
Admission: RE | Admit: 2023-02-21 | Discharge: 2023-02-21 | Disposition: A | Discharge status: Home / Self Care | Payer: 59 | Source: Ambulatory Visit

## 2023-02-21 ENCOUNTER — Ambulatory Visit (INDEPENDENT_AMBULATORY_CARE_PROVIDER_SITE_OTHER): Payer: 59 | Admitting: Physician Assistant

## 2023-02-21 ENCOUNTER — Other Ambulatory Visit (HOSPITAL_COMMUNITY): Payer: Self-pay | Admitting: Family Medicine

## 2023-02-21 ENCOUNTER — Ambulatory Visit (INDEPENDENT_AMBULATORY_CARE_PROVIDER_SITE_OTHER): Payer: Self-pay

## 2023-02-21 VITALS — BP 117/73 | HR 89 | Temp 97.4°F | Resp 18 | Ht 67.0 in | Wt 276.0 lb

## 2023-02-21 VITALS — BP 118/70 | HR 103 | Temp 97.6°F | Wt 279.0 lb

## 2023-02-21 DIAGNOSIS — N186 End stage renal disease: Secondary | ICD-10-CM | POA: Diagnosis present

## 2023-02-21 DIAGNOSIS — Z1211 Encounter for screening for malignant neoplasm of colon: Secondary | ICD-10-CM | POA: Insufficient documentation

## 2023-02-21 DIAGNOSIS — R1013 Epigastric pain: Secondary | ICD-10-CM | POA: Insufficient documentation

## 2023-02-21 DIAGNOSIS — R112 Nausea with vomiting, unspecified: Secondary | ICD-10-CM | POA: Insufficient documentation

## 2023-02-21 DIAGNOSIS — Z992 Dependence on renal dialysis: Secondary | ICD-10-CM

## 2023-02-21 DIAGNOSIS — E6609 Other obesity due to excess calories: Secondary | ICD-10-CM | POA: Insufficient documentation

## 2023-02-21 DIAGNOSIS — E119 Type 2 diabetes mellitus without complications: Secondary | ICD-10-CM | POA: Insufficient documentation

## 2023-02-21 DIAGNOSIS — I4819 Other persistent atrial fibrillation: Secondary | ICD-10-CM

## 2023-02-21 DIAGNOSIS — R1030 Lower abdominal pain, unspecified: Secondary | ICD-10-CM

## 2023-02-21 DIAGNOSIS — I5020 Unspecified systolic (congestive) heart failure: Secondary | ICD-10-CM | POA: Insufficient documentation

## 2023-02-21 LAB — POCT URINALYSIS DIP (MANUAL ENTRY)
Glucose, UA: NEGATIVE mg/dL
Leukocytes, UA: NEGATIVE
Nitrite, UA: NEGATIVE
Protein Ur, POC: 100 mg/dL — AB
Spec Grav, UA: 1.02 (ref 1.010–1.025)
Urobilinogen, UA: 0.2 E.U./dL
pH, UA: 5.5 (ref 5.0–8.0)

## 2023-02-21 MED ORDER — PROMETHAZINE HCL 12.5 MG PO TABS: 12.5000 mg | ORAL_TABLET | Freq: Four times a day (QID) | ORAL | 0 refills | Status: DC | PRN

## 2023-02-21 NOTE — ED Triage Notes (Signed)
"  I had called my doctor Tuesday due to Abdominal pain with Vomiting". This has been going on for "2-3 mos". Voids "fine, normal" (on dialysis). Stools "normal" (Last Bowel movement, yesterday). No fever.

## 2023-02-21 NOTE — Progress Notes (Signed)
POST OPERATIVE OFFICE NOTE    CC:  F/u for surgery  HPI:  This is a 67 y.o. 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 Edilia Bo on 01/05/2023.  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 This brachiocephalic fistula was first created and superficialized on 03/17/2022 by Dr. Edilia Bo.  Pt returns today for follow up.  Pt states she has felt okay since surgery. She has had GI issues since starting dialysis. She has intermittent, severe abdominal pain and nausea since starting dialysis. This will occur whether or not she eats anything. She is currently under the care of a GI doctor.   She denies any issues with her left arm.  She denies any symptoms of steal such as pain in the left hand, excessive coldness, weakness/numbness.  Her left upper extremity incisions have healed well.  She is currently dialyzing on Tuesdays, Thursdays, and Saturdays via [facility].   Allergies  Allergen Reactions   Bee Pollen Anaphylaxis and Other (See Comments)    UNCONFIRMED, but IS allergic to bee VENOM   Bee Venom Anaphylaxis   Sulfa Antibiotics Itching   Iodinated Contrast Media Nausea And Vomiting    Current Outpatient Medications  Medication Sig Dispense Refill   acetaminophen (TYLENOL) 325 MG tablet Take 2 tablets (650 mg total) by mouth every 6 (six) hours as needed for mild pain or moderate pain.     albuterol (PROVENTIL HFA) 108 (90 Base) MCG/ACT inhaler Inhale 1-2 puffs into the lungs every 6 (six) hours as needed for wheezing or shortness of breath. 18 g 2   amiodarone (PACERONE) 200 MG tablet TAKE 1 TABLET BY MOUTH TWICE A DAY (Patient taking differently: Take 200 mg by mouth daily.) 180 tablet 3   apixaban (ELIQUIS) 5 MG TABS tablet Take 1 tablet (5 mg total) by mouth 2 (two) times daily. 180 tablet 1   Blood Glucose Monitoring Suppl (ACCU-CHEK GUIDE) w/Device KIT Use to check blood sugar TID. Dx: E11.69 1 kit 0   busPIRone  (BUSPAR) 5 MG tablet TAKE ONE TABLET BY MOUTH TWICE DAILY @ 9AM & 5PM 180 tablet 11   colchicine 0.6 MG tablet Take 1/2 (one-half) tablet by mouth once daily 45 tablet 0   diphenhydrAMINE (BENADRYL) 25 mg capsule Take 25 mg by mouth every 6 (six) hours as needed for allergies or itching.     Dulaglutide (TRULICITY) 1.5 MG/0.5ML SOPN Inject 1.5 mg into the skin once a week. 2 mL 2   DULoxetine (CYMBALTA) 60 MG capsule TAKE ONE CAPSULE (60MG  TOTAL) BY MOUTH DAILY AT 9AM 180 capsule 11   EPINEPHrine 0.3 mg/0.3 mL IJ SOAJ injection Inject 0.3 mg into the muscle as needed for anaphylaxis. 1 each 1   fluticasone (FLONASE) 50 MCG/ACT nasal spray Place 2 sprays into both nostrils daily as needed for allergies or rhinitis. 16 g 3   glucose blood (ACCU-CHEK GUIDE) test strip USE TO CHECK BLOOD SUGAR 3 TIMES DAILY 100 strip 3   insulin lispro (HUMALOG KWIKPEN) 100 UNIT/ML KwikPen Inject 10 Units into the skin 3 (three) times daily. (Patient taking differently: Inject 5-10 Units into the skin 3 (three) times daily. Sling scale 105= 1-2 units 106=) 15 mL 3   lidocaine-prilocaine (EMLA) cream Apply 1 Application topically Every Tuesday,Thursday,and Saturday with dialysis.     linaclotide (LINZESS) 72 MCG capsule TAKE 1 CAPSULE (72 MCG TOTAL) BY MOUTH DAILY BEFORE BREAKFAST. IF BM NOT ACHIEVE WITH 1 CAPSULE  SHE MAY TAKE 2 DAILY. STOP IF DIARRHEA OCCURS (Patient taking differently: Take 72 mcg by mouth daily as needed (Constipation).  STOP IF DIARRHEA OCCURS) 60 capsule 3   loratadine (CLARITIN) 10 MG tablet TAKE ONE TABLET BY MOUTH DAILY AT 9AM 30 tablet 11   metoprolol succinate (TOPROL-XL) 25 MG 24 hr tablet TAKE 1 TABLET BY MOUTH IN THE MORNING AND AT BEDTIME 180 tablet 1   midodrine (PROAMATINE) 10 MG tablet Take 10 mg by mouth Every Tuesday,Thursday,and Saturday with dialysis. 30 min before Dialysis Take 10 mg 2 hours after on dialysis     Multiple Vitamins-Minerals (MULTIVITAMIN WOMEN PO) Take 1 tablet by  mouth daily.     multivitamin (RENA-VIT) TABS tablet Take 1 tablet by mouth daily.     nitroGLYCERIN (NITROSTAT) 0.4 MG SL tablet Place 1 tablet (0.4 mg total) under the tongue every 5 (five) minutes x 3 doses as needed for chest pain. 25 tablet 12   ondansetron (ZOFRAN) 4 MG tablet Take 4 mg by mouth every 8 (eight) hours.     ondansetron (ZOFRAN-ODT) 4 MG disintegrating tablet Take 1 tablet (4 mg total) by mouth every 8 (eight) hours as needed for nausea or vomiting. 20 tablet 0   oxyCODONE-acetaminophen (PERCOCET) 5-325 MG tablet Take 1 tablet by mouth every 6 (six) hours as needed for severe pain. 8 tablet 0   pantoprazole (PROTONIX) 40 MG tablet TAKE ONE TABLET BY MOUTH DAILY AT 9AM 90 tablet 11   polyethylene glycol powder (GLYCOLAX/MIRALAX) 17 GM/SCOOP powder Take 17 g by mouth daily. (Patient taking differently: Take 17 g by mouth daily as needed for mild constipation.) 238 g 0   pregabalin (LYRICA) 100 MG capsule Take 1 capsule (100 mg total) by mouth 3 (three) times daily as needed (for pain). 90 capsule 2   RESTASIS 0.05 % ophthalmic emulsion Place 1 drop into both eyes in the morning and at bedtime.  12   rosuvastatin (CRESTOR) 10 MG tablet TAKE ONE TABLET BY MOUTH DAILY AT 5PM 90 tablet 0   senna-docusate (SENOKOT-S) 8.6-50 MG tablet Take 1 tablet by mouth 2 (two) times daily. 14 tablet 0   torsemide (DEMADEX) 100 MG tablet Take 100 mg by mouth See admin instructions.  Take Sunday,Monday, Wednesday and Friday     isosorbide mononitrate (IMDUR) 30 MG 24 hr tablet Take 0.5 tablets (15 mg total) by mouth daily. 15 tablet 0   No current facility-administered medications for this visit.     ROS:  See HPI  Physical Exam:  Incision: Left upper extremity incisions well-healed without signs of infection or hematoma Extremities: Left upper extremity fistula with interposition AVG with palpable thrill on exam and good bruit on auscultation.  Palpable left radial pulse Neuro: Intact motor  and sensation of left upper extremity   Dialysis Duplex (02/21/2023)  +--------------------+----------+-----------------+-------------------+  AVF                PSV (cm/s)Flow Vol (mL/min)     Comments        +--------------------+----------+-----------------+-------------------+  Native artery inflow   223          1186                            +--------------------+----------+-----------------+-------------------+  AVF Anastomosis        62 6                     Interposition graft  +--------------------+----------+-----------------+-------------------+     +------------+----------+-------------+----------+-------------------+  OUTFLOW VEINPSV (cm/s)Diameter (cm)Depth (cm)     Describe        +------------+----------+-------------+----------+-------------------+  Shoulder       84        0.98        1.62                        +------------+----------+-------------+----------+-------------------+  Prox UA        145        1.23        0.86                        +------------+----------+-------------+----------+-------------------+  Mid UA         125        0.72        0.26                        +------------+----------+-------------+----------+-------------------+  Dist UA        136        0.79        0.23                        +------------+----------+-------------+----------+-------------------+  AC Fossa       346        1.04        0.49   Interposition graft    Assessment/Plan:  This is a 67 y.o. female who is s/p: Revision of left brachiocephalic fistula with interposition PTFE graft  -The patient denies any symptoms of steal since surgery.  She has no excessive coldness, pain, weakness/numbness of the left hand.  She has a palpable left radial pulse -Her left upper extremity incisions are well-healed without signs of infection or hematoma. -Dupler of her interposition PTFE graft demonstrates a patent graft with good  inflow of 1186 ml/min.  The graft is greater than 6 mm throughout the arm and is close to the surface of the skin -On exam her graft has a good thrill on palpation and good bruit on auscultation throughout the arm -I believe her graft would be ready for use by July 12.  I explained to the patient if her graft can be used for at least 2-3 dialysis sessions without any issues, her nephrologist can have her [facility] removed -She can follow-up with our office as needed    Loel Dubonnet, PA-C [facility] [phone]   Clinic MD:  Randie Heinz

## 2023-02-21 NOTE — Telephone Encounter (Signed)
Will forward to provider  

## 2023-02-21 NOTE — Telephone Encounter (Signed)
Eliquis 5mg  refill request received. Patient is 67 years old, weight-125.2kg, Crea-7.50 on 01/12/23, Diagnosis-Afib, and last seen by Prince Rome at [facility] on 12/06/22. Dose is appropriate based on dosing criteria. Will send in refill to requested pharmacy.

## 2023-02-21 NOTE — ED Provider Notes (Addendum)
[facility]    CSN: [id number] Arrival date & time: 02/21/23  1650      History   Chief Complaint Chief Complaint  Patient presents with   Abdominal Pain    abd pain, vomiting    HPI [name] is a 67 y.o. female.   Patient presents with nausea, vomiting, abdominal pain that has been present for about 2 to 3 months.  Reports that over the past few days, it is increased in intensity.  She has has been able to keep fluids down but not food over the past few days.  Reports that she has been evaluated by multiple physicians for this.  She went to the ER then and had an unremarkable workup with imaging of the abdomen.  She is also followed by GI specialist where she had an endoscopy.  States they are not sure exactly what is going on but they have ordered a barium swallow and are concerned for gastroparesis.  Abdominal pain is rated  9/10 on pain scale and described as a constant pain.  Patient denies blood in emesis or stool.  Last bowel movement was yesterday.  She states that she typically has bowel movements weekly which is baseline for her.  Denies any fever. 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    Abdominal Pain   Past Medical History:  Diagnosis Date   Acute on chronic systolic CHF (congestive heart failure) (HCC) 12/21/2020   Allergic rhinitis    Anemia    Arthritis    Asthma    Chronic diastolic CHF (congestive heart failure) (HCC)    Chronic kidney disease    Dialysis Tu/TH/Sa   COPD (chronic obstructive pulmonary disease) (HCC)    Depression    DM (diabetes mellitus) (HCC)    DVT (deep vein thrombosis) in pregnancy    GERD (gastroesophageal reflux disease)    HTN (hypertension)    Hyperlipidemia    Obesity    Pneumonia 04/2018   RIGHT LOBE   Sleep apnea    needs to be retested - to get new cpap    Patient Active Problem List   Diagnosis Date Noted   Epigastric pain 02/21/2023   Systolic heart failure (HCC) 02/21/2023    Type 2 diabetes mellitus without complications (HCC) 02/21/2023   Screening for malignant neoplasm of colon 02/21/2023   Nausea and vomiting 02/21/2023   Obesity due to excess calories 02/21/2023   Hypercoagulable state due to persistent atrial fibrillation (HCC) 10/20/2022   HCAP (healthcare-associated pneumonia) 06/21/2022   ESRD on dialysis (HCC) 06/21/2022   OSA on CPAP 06/21/2022   Acute respiratory failure with hypoxia (HCC)    Acute hypoxic respiratory failure (HCC) 06/20/2022   Acute kidney injury superimposed on chronic kidney disease (HCC) 03/18/2022   Class 3 obesity (HCC) 03/18/2022   Depression 01/21/2022   Chronic deep vein thrombosis (DVT) of other vein of right upper extremity (HCC) 12/29/2021   Abnormal weight loss 11/16/2021   Personal history of colonic polyps 11/16/2021   Rib pain 11/16/2021   Anxiety and depression 08/26/2021   Essential hypertension 08/26/2021   GERD (gastroesophageal reflux disease) 08/26/2021   COPD (chronic obstructive pulmonary disease) (HCC) 07/29/2021   Biventricular heart failure (HCC)    Atrial flutter (HCC)    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   Encounter for preoperative dental examination    Teeth missing    Gingivitis    Accretions on  teeth    Atrial fibrillation, permanent (HCC) 11/02/2020   Acute on chronic right heart failure (HCC) 05/06/2020   NICM (nonischemic cardiomyopathy) (HCC) 04/01/2020   Family history of heart disease 04/01/2020   Mitral regurgitation 04/01/2020   Acute on chronic left systolic heart failure (HCC) 07/01/2018   Acute on chronic diastolic CHF (congestive heart failure) (HCC) 07/01/2018   Chronic atrial fibrillation (HCC) 12/26/2017   Diastolic dysfunction 12/26/2017   Diabetes mellitus type 2 in obese 12/26/2017   Hypoxia 09/21/2011   Type 2 diabetes mellitus with hyperlipidemia (HCC) 02/04/2010   AF (paroxysmal atrial fibrillation) (HCC) 02/04/2010    Gastroparesis 02/04/2010    Past Surgical History:  Procedure Laterality Date   ABDOMINAL HYSTERECTOMY     AV FISTULA PLACEMENT Left 03/17/2022   Procedure: LEFT ARM ARTERIOVENOUS (AV) FISTULA CREATION;  Surgeon: Chuck Hint, MD;  Location: [facility] OR;  Service: Vascular;  Laterality: Left;   BIOPSY  01/12/2023   Procedure: BIOPSY;  Surgeon: Jeani Hawking, MD;  Location: [facility] ENDOSCOPY;  Service: Gastroenterology;;   Thressa Sheller STUDY  11/26/2020   Procedure: BUBBLE STUDY;  Surgeon: Jodelle Red, MD;  Location: [facility] ENDOSCOPY;  Service: Cardiovascular;;   CARDIOVERSION N/A 05/06/2020   Procedure: CARDIOVERSION;  Surgeon: Jodelle Red, MD;  Location: [facility] ENDOSCOPY;  Service: Cardiovascular;  Laterality: N/A;   CARDIOVERSION N/A 11/04/2020   Procedure: CARDIOVERSION;  Surgeon: Lewayne Bunting, MD;  Location: [facility] ENDOSCOPY;  Service: Cardiovascular;  Laterality: N/A;   CARDIOVERSION N/A 02/01/2021   Procedure: CARDIOVERSION;  Surgeon: Dolores Patty, MD;  Location: [facility] ENDOSCOPY;  Service: Cardiovascular;  Laterality: N/A;   CARDIOVERSION N/A 02/09/2021   Procedure: CARDIOVERSION;  Surgeon: Dolores Patty, MD;  Location: [facility] ENDOSCOPY;  Service: Cardiovascular;  Laterality: N/A;   CARDIOVERSION N/A 04/19/2021   Procedure: CARDIOVERSION;  Surgeon: Pricilla Riffle, MD;  Location: [facility] ENDOSCOPY;  Service: Cardiovascular;  Laterality: N/A;   CARDIOVERSION N/A 11/25/2021   Procedure: CARDIOVERSION;  Surgeon: Dolores Patty, MD;  Location: [facility] ENDOSCOPY;  Service: Cardiovascular;  Laterality: N/A;   CARDIOVERSION N/A 01/31/2022   Procedure: CARDIOVERSION;  Surgeon: Dolores Patty, MD;  Location: [facility] ENDOSCOPY;  Service: Cardiovascular;  Laterality: N/A;   CATARACT EXTRACTION, BILATERAL     CHOLECYSTECTOMY     COLONOSCOPY WITH PROPOFOL N/A 03/05/2015   Procedure: COLONOSCOPY WITH PROPOFOL;  Surgeon: Jeani Hawking, MD;  Location: [facility] ENDOSCOPY;  Service: Endoscopy;  Laterality: N/A;    DIAGNOSTIC LAPAROSCOPY     ESOPHAGOGASTRODUODENOSCOPY (EGD) WITH PROPOFOL N/A 01/12/2023   Procedure: ESOPHAGOGASTRODUODENOSCOPY (EGD) WITH PROPOFOL;  Surgeon: Jeani Hawking, MD;  Location: [facility] ENDOSCOPY;  Service: Gastroenterology;  Laterality: N/A;   FISTULA SUPERFICIALIZATION Left 03/17/2022   Procedure: FISTULA SUPERFICIALIZATION -LEFT;  Surgeon: Chuck Hint, MD;  Location: [facility] OR;  Service: Vascular;  Laterality: Left;   ingrown hallux Left    IR FLUORO GUIDE CV LINE RIGHT  03/14/2022   IR US GUIDE VASC ACCESS RIGHT  03/14/2022   KNEE SURGERY     LEFT HEART CATH AND CORONARY ANGIOGRAPHY N/A 12/31/2017   Procedure: LEFT HEART CATH AND CORONARY ANGIOGRAPHY;  Surgeon: Rinaldo Cloud, MD;  Location: [facility] INVASIVE CV LAB;  Service: Cardiovascular;  Laterality: N/A;   MAZE N/A 12/29/2020   Procedure: MAZE;  Surgeon: Loreli Slot, MD;  Location: [facility] OR;  Service: Open Heart Surgery;  Laterality: N/A;   MITRAL VALVE REPAIR N/A 12/29/2020   Procedure: MITRAL VALVE REPAIR USING CARBOMEDICS ANNULOFLEX RING SIZE 30;  Surgeon: Loreli Slot,  MD;  Location: [facility] OR;  Service: Open Heart Surgery;  Laterality: N/A;   REVISON OF ARTERIOVENOUS FISTULA Left 01/05/2023   Procedure: REVISION OF LEFT ARTERIOVENOUS FISTULA WITH INTERPOSITION PTFE GRAFT;  Surgeon: Chuck Hint, MD;  Location: [facility] OR;  Service: Vascular;  Laterality: Left;  ELIQUIS AND HOLD 48 X HOURS   RIGHT HEART CATH N/A 12/22/2020   Procedure: RIGHT HEART CATH;  Surgeon: Laurey Morale, MD;  Location: [facility] INVASIVE CV LAB;  Service: Cardiovascular;  Laterality: N/A;   RIGHT HEART CATH N/A 01/31/2021   Procedure: RIGHT HEART CATH;  Surgeon: Dolores Patty, MD;  Location: [facility] INVASIVE CV LAB;  Service: Cardiovascular;  Laterality: N/A;   RIGHT HEART CATH N/A 07/29/2021   Procedure: RIGHT HEART CATH;  Surgeon: Dolores Patty, MD;  Location: [facility] INVASIVE CV LAB;  Service: Cardiovascular;  Laterality: N/A;   RIGHT  HEART CATH N/A 11/24/2021   Procedure: RIGHT HEART CATH;  Surgeon: Dolores Patty, MD;  Location: [facility] INVASIVE CV LAB;  Service: Cardiovascular;  Laterality: N/A;   RIGHT HEART CATH N/A 02/14/2022   Procedure: RIGHT HEART CATH;  Surgeon: Dolores Patty, MD;  Location: [facility] INVASIVE CV LAB;  Service: Cardiovascular;  Laterality: N/A;   RIGHT HEART CATH N/A 02/22/2022   Procedure: RIGHT HEART CATH;  Surgeon: Laurey Morale, MD;  Location: [facility] INVASIVE CV LAB;  Service: Cardiovascular;  Laterality: N/A;   TEE WITHOUT CARDIOVERSION N/A 11/26/2020   Procedure: TRANSESOPHAGEAL ECHOCARDIOGRAM (TEE);  Surgeon: Jodelle Red, MD;  Location: [facility] ENDOSCOPY;  Service: Cardiovascular;  Laterality: N/A;   TEE WITHOUT CARDIOVERSION N/A 12/29/2020   Procedure: TRANSESOPHAGEAL ECHOCARDIOGRAM (TEE);  Surgeon: Loreli Slot, MD;  Location: [facility] OR;  Service: Open Heart Surgery;  Laterality: N/A;   TEE WITHOUT CARDIOVERSION N/A 01/20/2022   Procedure: TRANSESOPHAGEAL ECHOCARDIOGRAM (TEE);  Surgeon: Dolores Patty, MD;  Location: [facility] ENDOSCOPY;  Service: Cardiovascular;  Laterality: N/A;   TRICUSPID VALVE REPLACEMENT N/A 12/29/2020   Procedure: 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 RING SIZE 34;  Surgeon: Loreli Slot, MD;  Location: [facility] OR;  Service: Open Heart Surgery;  Laterality: N/A;    OB History   No obstetric history on file.      Home Medications    Prior to Admission medications   Medication Sig Start Date End Date Taking? Authorizing Provider  albuterol (PROVENTIL HFA) 108 (90 Base) MCG/ACT inhaler Inhale 1-2 puffs into the lungs every 6 (six) hours as needed for wheezing or shortness of breath. 10/13/22  Yes Grayce Sessions, NP  amiodarone (PACERONE) 200 MG tablet TAKE 1 TABLET BY MOUTH TWICE A DAY Patient taking differently: Take 200 mg by mouth daily. 04/25/22  Yes Bensimhon, Bevelyn Buckles, MD  apixaban (ELIQUIS) 5 MG TABS tablet Take 1 tablet (5 mg total) by  mouth 2 (two) times daily. 02/21/23  Yes Bensimhon, Bevelyn Buckles, MD  Blood Glucose Monitoring Suppl (ACCU-CHEK GUIDE) w/Device KIT Use to check blood sugar TID. 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 Dx: E11.69 Last reading: 105, 02-20-2023, Fasting. 06/28/22  Yes Hoy Register, MD  busPIRone (BUSPAR) 5 MG tablet TAKE ONE TABLET BY MOUTH TWICE DAILY @ 9AM & 5PM 09/06/22  Yes Grayce Sessions, NP  colchicine 0.6 MG tablet Take 1/2 (one-half) tablet by mouth once daily 11/23/22  Yes Newlin, Enobong, MD  Dulaglutide (TRULICITY) 1.5 MG/0.5ML SOPN Inject 1.5 mg into the skin once a week. 07/05/22  Yes Grayce Sessions,  NP  DULoxetine (CYMBALTA) 60 MG capsule TAKE ONE CAPSULE (60MG  TOTAL) BY MOUTH DAILY AT 9AM 02/12/23  Yes Grayce Sessions, NP  fluticasone (FLONASE) 50 MCG/ACT nasal spray Place 2 sprays into both nostrils daily as needed for allergies or rhinitis. 09/22/22  Yes Grayce Sessions, NP  insulin lispro (HUMALOG KWIKPEN) 100 UNIT/ML KwikPen Inject 10 Units into the skin 3 (three) times daily. Patient taking differently: Inject 5-10 Units into the skin 3 (three) times daily. Sling scale 105= 1-2 units 106= 07/05/22  Yes Grayce Sessions, NP  lidocaine-prilocaine (EMLA) cream Apply 1 Application topically Every Tuesday,Thursday,and Saturday with dialysis. 06/01/22  Yes [provider]  loratadine (CLARITIN) 10 MG tablet TAKE ONE TABLET BY MOUTH DAILY AT 9AM 09/06/22  Yes Grayce Sessions, NP  metoprolol succinate (TOPROL-XL) 25 MG 24 hr tablet TAKE 1 TABLET BY MOUTH IN THE MORNING AND AT BEDTIME 09/12/22  Yes Lanier Prude, MD  midodrine (PROAMATINE) 10 MG tablet Take 10 mg by mouth Every Tuesday,Thursday,and Saturday with dialysis. 30 min before Dialysis Take 10 mg 2 hours after on dialysis 08/16/22  Yes [provider]  Multiple Vitamins-Minerals (MULTIVITAMIN WOMEN PO) Take 1 tablet by mouth daily.   Yes [provider]   multivitamin (RENA-VIT) TABS tablet Take 1 tablet by mouth daily. 04/27/22  Yes [provider]  ondansetron (ZOFRAN) 4 MG tablet Take 4 mg by mouth every 8 (eight) hours. 02/06/23  Yes [provider]  ondansetron (ZOFRAN-ODT) 4 MG disintegrating tablet Take 1 tablet (4 mg total) by mouth every 8 (eight) hours as needed for nausea or vomiting. 01/11/23  Yes Gerhard Munch, MD  pantoprazole (PROTONIX) 40 MG tablet TAKE ONE TABLET BY MOUTH DAILY AT 9AM 04/17/22  Yes Grayce Sessions, NP  pregabalin (LYRICA) 100 MG capsule Take 1 capsule (100 mg total) by mouth 3 (three) times daily as needed (for pain). 07/05/22  Yes Grayce Sessions, NP  promethazine (PHENERGAN) 12.5 MG tablet Take 1 tablet (12.5 mg total) by mouth every 6 (six) hours as needed for nausea or vomiting. 02/21/23  Yes , Rolly Salter E, FNP  RESTASIS 0.05 % ophthalmic emulsion Place 1 drop into both eyes in the morning and at bedtime. 11/19/17  Yes [provider]  rosuvastatin (CRESTOR) 10 MG tablet TAKE ONE TABLET BY MOUTH DAILY AT 5PM 12/05/22  Yes Bensimhon, Bevelyn Buckles, MD  senna-docusate (SENOKOT-S) 8.6-50 MG tablet Take 1 tablet by mouth 2 (two) times daily. 02/24/22  Yes Burnadette Pop, MD  torsemide (DEMADEX) 100 MG tablet Take 100 mg by mouth See admin instructions.  Take Sunday,Monday, Wednesday and Friday 04/02/22  Yes [provider]  acetaminophen (TYLENOL) 325 MG tablet Take 2 tablets (650 mg total) by mouth every 6 (six) hours as needed for mild pain or moderate pain. 12/07/21   Arrien, York Ram, MD  diphenhydrAMINE (BENADRYL) 25 mg capsule Take 25 mg by mouth every 6 (six) hours as needed for allergies or itching.    [provider]  EPINEPHrine 0.3 mg/0.3 mL IJ SOAJ injection Inject 0.3 mg into the muscle as needed for anaphylaxis. 06/25/20   Grayce Sessions, NP  glucose blood (ACCU-CHEK GUIDE) test strip USE TO CHECK BLOOD SUGAR 3 TIMES DAILY 04/03/22   Hoy Register, MD   isosorbide mononitrate (IMDUR) 30 MG 24 hr tablet Take 0.5 tablets (15 mg total) by mouth daily. 03/18/22 12/29/22  Arrien, York Ram, MD  linaclotide Karlene Einstein) 72 MCG capsule TAKE 1 CAPSULE (72 MCG  TOTAL) BY MOUTH DAILY BEFORE BREAKFAST. IF BM NOT ACHIEVE WITH 1 CAPSULE SHE MAY TAKE 2 DAILY. STOP IF DIARRHEA OCCURS Patient taking differently: Take 72 mcg by mouth daily as needed (Constipation).  STOP IF DIARRHEA OCCURS 07/05/22   Grayce Sessions, NP  nitroGLYCERIN (NITROSTAT) 0.4 MG SL tablet Place 1 tablet (0.4 mg total) under the tongue every 5 (five) minutes x 3 doses as needed for chest pain. 01/08/14   Rinaldo Cloud, MD  oxyCODONE-acetaminophen (PERCOCET) 5-325 MG tablet Take 1 tablet by mouth every 6 (six) hours as needed for severe pain. 01/05/23   Rhyne, Ames Coupe, PA-C  polyethylene glycol powder (GLYCOLAX/MIRALAX) 17 GM/SCOOP powder Take 17 g by mouth daily. Patient taking differently: Take 17 g by mouth daily as needed for mild constipation. 02/25/22   Burnadette Pop, MD    Family History Family History  Problem Relation Age of Onset   Breast cancer Mother    Cancer Mother    Hypertension Mother    Cancer Father    Hypertension Sister    Hypertension Brother    CAD Other     Social History Social History   Tobacco Use   Smoking status: Never    Passive exposure: Never   Smokeless tobacco: Never   Tobacco comments:    Never smoke 10/20/22  Vaping Use   Vaping Use: Never used  Substance Use Topics   Alcohol use: No   Drug use: No     Allergies   Bee pollen, Bee venom, Sulfa antibiotics, and Iodinated contrast media   Review of Systems Review of Systems Per HPI  Physical Exam Triage Vital Signs ED Triage Vitals  Enc Vitals Group     BP 02/21/23 1746 117/73     Pulse Rate 02/21/23 1746 89     Resp 02/21/23 1746 18     Temp 02/21/23 1746 (!) 97.4 F (36.3 C)     Temp Source 02/21/23 1746 Oral     SpO2 02/21/23 1746 97 %     Weight 02/21/23 1740  276 lb (125.2 kg)     Height 02/21/23 1740 5\' 7"  (1.702 m)     Head Circumference --      Peak Flow --      Pain Score 02/21/23 1740 9     Pain Loc --      Pain Edu? --      Excl. in GC? --    No data found.  Updated Vital Signs BP 117/73 (BP Location: Left Arm)   Pulse 89   Temp (!) 97.4 F (36.3 C) (Oral)   Resp 18   Ht 5\' 7"  (1.702 m)   Wt 276 lb (125.2 kg)   SpO2 97%   BMI 43.23 kg/m   Visual Acuity Right Eye Distance:   Left Eye Distance:   Bilateral Distance:    Right Eye Near:   Left Eye Near:    Bilateral Near:     Physical Exam Constitutional:      General: She is not in acute distress.    Appearance: Normal appearance. She is not toxic-appearing or diaphoretic.  HENT:     Head: Normocephalic and atraumatic.  Eyes:     Extraocular Movements: Extraocular movements intact.     Conjunctiva/sclera: Conjunctivae normal.  Cardiovascular:     Rate and Rhythm: Normal rate and regular rhythm.     Pulses: Normal pulses.     Heart sounds: Normal heart sounds.  Pulmonary:  Effort: Pulmonary effort is normal. No respiratory distress.     Breath sounds: Normal breath sounds.  Abdominal:     General: Bowel sounds are normal. There is no distension.     Palpations: Abdomen is soft.     Tenderness: There is abdominal tenderness.     Comments: Patient is significantly tender to palpation to the lower abdomen and right quadrant.  Neurological:     General: No focal deficit present.     Mental Status: She is alert and oriented to person, place, and time. Mental status is at baseline.  Psychiatric:        Mood and Affect: Mood normal.        Behavior: Behavior normal.        Thought Content: Thought content normal.        Judgment: Judgment normal.      UC Treatments / Results  Labs (all labs ordered are listed, but only abnormal results are displayed) Labs Reviewed  POCT URINALYSIS DIP (MANUAL ENTRY) - Abnormal; Notable for the following components:       Result Value   Clarity, UA hazy (*)    Bilirubin, UA small (*)    Ketones, POC UA trace (5) (*)    Blood, UA small (*)    Protein Ur, POC =100 (*)    All other components within normal limits  CBC  COMPREHENSIVE METABOLIC PANEL    EKG   Radiology VAS US DUPLEX DIALYSIS ACCESS (AVF, AVG)  Result Date: 02/21/2023 DIALYSIS ACCESS Patient Name:  [name]  Date of Exam:   02/21/2023 Medical Rec #: [id number]               Accession #:    [id number] Date of Birth: 27-Jun-1956               Patient Gender: F Patient Age:   76 years Exam Location:  [facility] Procedure:      VAS US DUPLEX DIALYSIS ACCESS (AVF, AVG) Referring Phys: Cristal Deer DICKSON --------------------------------------------------------------------------------  Reason for Exam: Routine follow up. Access Site: Left Upper Extremity. Access Type: Brachial-cephalic AVF. History: 03/17/2022 Creation of left brachiocephalic fistula          01/05/2023 Revision of left AVF with interposition PTFE graft. Comparison Study: 05/04/2022 Dialysis access duplex- Patent arteriovenous                   fistula. Performing Technologist: Gertie Fey MHA, RDMS, RVT, RDCS  Examination Guidelines: A complete evaluation includes B-mode imaging, spectral Doppler, color Doppler, and power Doppler as needed of all accessible portions of each vessel. Unilateral testing is considered an integral part of a complete examination. Limited examinations for reoccurring indications may be performed as noted.  Findings: +--------------------+----------+-----------------+-------------------+ AVF                 PSV (cm/s)Flow Vol (mL/min)     Comments       +--------------------+----------+-----------------+-------------------+ Native artery inflow   223          1186                           +--------------------+----------+-----------------+-------------------+ AVF Anastomosis        626                     Interposition  graft +--------------------+----------+-----------------+-------------------+  +------------+----------+-------------+----------+-------------------+ OUTFLOW VEINPSV (cm/s)Diameter (cm)Depth (cm)     Describe       +------------+----------+-------------+----------+-------------------+  Shoulder        84        0.98        1.62                       +------------+----------+-------------+----------+-------------------+ Prox UA        145        1.23        0.86                       +------------+----------+-------------+----------+-------------------+ Mid UA         125        0.72        0.26                       +------------+----------+-------------+----------+-------------------+ Dist UA        136        0.79        0.23                       +------------+----------+-------------+----------+-------------------+ AC Fossa       346        1.04        0.49   Interposition graft +------------+----------+-------------+----------+-------------------+   Summary: Patent arteriovenous fistula. *See table(s) above for measurements and observations.  Diagnosing physician: Waverly Ferrari MD Electronically signed by Waverly Ferrari MD on 02/21/2023 at 2:08:19 PM.   --------------------------------------------------------------------------------   Final     Procedures Procedures (including critical care time)  Medications Ordered in UC Medications - No data to display  Initial Impression / Assessment and Plan / UC Course  I have reviewed the triage vital signs and the nursing notes.  Pertinent labs & imaging results that were available during my care of the patient were reviewed by me and considered in my medical decision making (see chart for details).     Given how abdominal pain is increased in intensity and patient being significantly tender to palpation to abdomen on exam, recommended to patient that she go to the ER today for further evaluation and  management.  Patient was adamant that she was not going to the emergency department.  Risks associated with not going to the ER were discussed with patient.  Patient voiced understanding and accepted risks.  Will do limited workup at urgent care given patient is declining ER evaluation.  CMP and CBC pending.  Patient reports that ondansetron that was previously prescribed has not been helpful for nausea.  Discussed promethazine treatment for nausea and risks associated with this.  It should be safe with patient's end-stage renal disease but advised patient that it can make her drowsy.  She voiced understanding with this.  Patient does take a few medications that could cause drowsiness especially when used with promethazine but encouraged patient to take separately to avoid adverse effects.  I do think the benefits outweigh risks to ensure that patient does not become dehydrated.  Advised to not drive or drink alcohol while taking medication.  Encouraged strict follow-up with GI specialist and PCP and strict ER precautions if symptoms persist or worsen.  Patient verbalized understanding and was agreeable. Final Clinical Impressions(s) / UC Diagnoses   Final diagnoses:  Nausea and vomiting, unspecified vomiting type  Lower abdominal pain     Discharge Instructions      Blood work is pending.  Please follow-up with your stomach doctor.  Go  to the emergency department if symptoms persist or worsen.    ED Prescriptions     Medication Sig Dispense Auth. Provider   promethazine (PHENERGAN) 12.5 MG tablet Take 1 tablet (12.5 mg total) by mouth every 6 (six) hours as needed for nausea or vomiting. 10 tablet Gustavus Bryant, [place]      PDMP not reviewed this encounter.   Gustavus Bryant, [place] 02/21/23 1951    Gustavus Bryant, [place] 02/21/23 1953    Gustavus Bryant, [place] 02/21/23 [phone]

## 2023-02-21 NOTE — Discharge Instructions (Signed)
Blood work is pending.  Please follow-up with your stomach doctor.  Go to the emergency department if symptoms persist or worsen.

## 2023-02-21 NOTE — Telephone Encounter (Signed)
  Chief Complaint: vomiting  Symptoms: vomiting and lower abd pain Frequency: 1 day Pertinent Negatives: Patient denies diarrhea  Disposition: [] ED /[x] Urgent Care (no appt availability in office) / [] Appointment(In office/virtual)/ []  [facility]/ [] Home Care/ [] Refused Recommended Disposition /[] [facility]/ []  Follow-up with PCP Additional Notes: pt states she has vomited 4-5x in past 24 hrs. Has taken some Tylenol for pain but nothing for vomiting. No appts until 03/05/23, recommended UC today. Pt scheduled UC appt at 1700. Care advice given and pt verbalized understanding.   Summary: vomiting   Patient called in stated she is unable to keep down any food since yesterday.         Reason for Disposition  [1] MILD or MODERATE vomiting AND [2] present > 48 hours (2 days) (Exception: Mild vomiting with associated diarrhea.)  Answer Assessment - Initial Assessment Questions 1. VOMITING SEVERITY: "How many times have you vomited in the past 24 hours?"     - MILD:  1 - 2 times/day    - MODERATE: 3 - 5 times/day, decreased oral intake without significant weight loss or symptoms of dehydration    - SEVERE: 6 or more times/day, vomits everything or nearly everything, with significant weight loss, symptoms of dehydration      4-5 2. ONSET: "When did the vomiting begin?"      yesterday 3. FLUIDS: "What fluids or food have you vomited up today?" "Have you been able to keep any fluids down?"     At times  4. ABDOMEN PAIN: "Are your having any abdomen pain?" If Yes : "How bad is it and what does it feel like?" (e.g., crampy, dull, intermittent, constant)      Constant lower abdomen  5. DIARRHEA: "Is there any diarrhea?" If Yes, ask: "How many times today?"      No , LBM Saturday  6. CONTACTS: "Is there anyone else in the family with the same symptoms?"      no 8. HYDRATION STATUS: "Any signs of dehydration?" (e.g., dry mouth [not only dry lips], too weak to stand) "When  did you last urinate?"     yes 9. OTHER SYMPTOMS: "Do you have any other symptoms?" (e.g., fever, headache, vertigo, vomiting blood or coffee grounds, recent head injury)     no  Protocols used: Vomiting-A-AH

## 2023-02-23 ENCOUNTER — Telehealth: Payer: Self-pay | Admitting: Cardiology

## 2023-02-23 NOTE — Telephone Encounter (Signed)
Patient c/o Palpitations:  High priority if patient c/o lightheadedness, shortness of breath, or chest pain  How long have you had palpitations/irregular HR/ Afib? Are you having the symptoms now? Yes   Are you currently experiencing lightheadedness, SOB or CP? Nervousness, nausea   Do you have a history of afib (atrial fibrillation) or irregular heart rhythm? yes  Have you checked your BP or HR? (document readings if available):    Are you experiencing any other symptoms? Just over all dont feel good, was told to called the dr when she feels like this. Please advise

## 2023-02-23 NOTE — Telephone Encounter (Signed)
Returned call to patient after contacting Afib clinic to check for appt openings today.  Spoke with Kennyth Arnold (in afib clinic) who had provider review patient's chart. Provider recommended patient see PCP.  She was seen at Urgent Care yesterday and recommendation was to go to ED, however, pt refused ED.  Discussed recommendation to call PCP, or go to the ED.  Pt was able to speak in complete sentences and raise her voice.  Pt. Ended call abruptly and did not return message left on voicemail with attempted return call. Jim Like MHA RN CCM

## 2023-02-24 ENCOUNTER — Emergency Department (HOSPITAL_COMMUNITY): Payer: 59

## 2023-02-24 ENCOUNTER — Emergency Department (HOSPITAL_COMMUNITY)
Admission: EM | Admit: 2023-02-24 | Discharge: 2023-02-24 | Disposition: A | Discharge status: Home / Self Care | Payer: 59 | Attending: Emergency Medicine | Admitting: Emergency Medicine

## 2023-02-24 ENCOUNTER — Other Ambulatory Visit: Payer: Self-pay

## 2023-02-24 DIAGNOSIS — R110 Nausea: Secondary | ICD-10-CM

## 2023-02-24 DIAGNOSIS — Z7901 Long term (current) use of anticoagulants: Secondary | ICD-10-CM | POA: Diagnosis not present

## 2023-02-24 DIAGNOSIS — M545 Low back pain, unspecified: Secondary | ICD-10-CM | POA: Insufficient documentation

## 2023-02-24 DIAGNOSIS — R1084 Generalized abdominal pain: Secondary | ICD-10-CM | POA: Diagnosis not present

## 2023-02-24 DIAGNOSIS — Z992 Dependence on renal dialysis: Secondary | ICD-10-CM | POA: Diagnosis not present

## 2023-02-24 DIAGNOSIS — R112 Nausea with vomiting, unspecified: Secondary | ICD-10-CM | POA: Diagnosis present

## 2023-02-24 DIAGNOSIS — Z794 Long term (current) use of insulin: Secondary | ICD-10-CM | POA: Insufficient documentation

## 2023-02-24 DIAGNOSIS — N186 End stage renal disease: Secondary | ICD-10-CM | POA: Diagnosis not present

## 2023-02-24 LAB — COMPREHENSIVE METABOLIC PANEL
ALT: 15 IU/L (ref 0–32)
ALT: 16 U/L (ref 0–44)
AST: 15 IU/L (ref 0–40)
AST: 17 U/L (ref 15–41)
Albumin: 4.4 g/dL (ref 3.9–4.9)
Albumin: 3.6 g/dL (ref 3.5–5.0)
Alkaline Phosphatase: 99 IU/L (ref 44–121)
Alkaline Phosphatase: 80 U/L (ref 38–126)
Anion gap: 8 (ref 5–15)
BUN/Creatinine Ratio: 4 — ABNORMAL LOW (ref 12–28)
BUN: 41 mg/dL — ABNORMAL HIGH (ref 8–27)
BUN: 14 mg/dL (ref 8–23)
Bilirubin Total: 0.4 mg/dL (ref 0.0–1.2)
CO2: 24 mmol/L (ref 20–29)
CO2: 30 mmol/L (ref 22–32)
Calcium: 9.7 mg/dL (ref 8.7–10.3)
Calcium: 9 mg/dL (ref 8.9–10.3)
Chloride: 99 mmol/L (ref 96–106)
Chloride: 98 mmol/L (ref 98–111)
Creatinine, Ser: 10.35 mg/dL — ABNORMAL HIGH (ref 0.57–1.00)
Creatinine, Ser: 5.44 mg/dL — ABNORMAL HIGH (ref 0.44–1.00)
GFR, Estimated: 8 mL/min — ABNORMAL LOW (ref >60)
Globulin, Total: 3.3 g/dL (ref 1.5–4.5)
Glucose, Bld: 153 mg/dL — ABNORMAL HIGH (ref 70–99)
Glucose: 85 mg/dL (ref 70–99)
Potassium: 4.4 mmol/L (ref 3.5–5.2)
Potassium: 3.6 mmol/L (ref 3.5–5.1)
Sodium: 141 mmol/L (ref 134–144)
Sodium: 136 mmol/L (ref 135–145)
Total Bilirubin: 0.5 mg/dL (ref 0.3–1.2)
Total Protein: 7.7 g/dL (ref 6.0–8.5)
Total Protein: 8 g/dL (ref 6.5–8.1)
eGFR: 4 mL/min/{1.73_m2} — ABNORMAL LOW (ref >59)

## 2023-02-24 LAB — CBC
HCT: 35.4 % — ABNORMAL LOW (ref 36.0–46.0)
Hematocrit: 34.9 % (ref 34.0–46.6)
Hemoglobin: 11.3 g/dL (ref 11.1–15.9)
Hemoglobin: 11.2 g/dL — ABNORMAL LOW (ref 12.0–15.0)
MCH: 31.1 pg (ref 26.6–33.0)
MCH: 30.6 pg (ref 26.0–34.0)
MCHC: 32.4 g/dL (ref 31.5–35.7)
MCHC: 31.6 g/dL (ref 30.0–36.0)
MCV: 96 fL (ref 79–97)
MCV: 96.7 fL (ref 80.0–100.0)
Platelets: 133 10*3/uL — ABNORMAL LOW (ref 150–450)
Platelets: 151 10*3/uL (ref 150–400)
RBC: 3.63 x10E6/uL — ABNORMAL LOW (ref 3.77–5.28)
RBC: 3.66 MIL/uL — ABNORMAL LOW (ref 3.87–5.11)
RDW: 12.7 % (ref 11.7–15.4)
RDW: 13.9 % (ref 11.5–15.5)
WBC: 5.5 10*3/uL (ref 3.4–10.8)
WBC: 5.1 10*3/uL (ref 4.0–10.5)
nRBC: 0 % (ref 0.0–0.2)

## 2023-02-24 LAB — LIPASE, BLOOD: Lipase: 35 U/L (ref 11–51)

## 2023-02-24 MED ORDER — ONDANSETRON HCL 4 MG/2ML IJ SOLN
4.0000 mg | Freq: Once | INTRAMUSCULAR | Status: AC
  Administered 2023-02-24: 4 mg via INTRAVENOUS
  Filled 2023-02-24: qty 2

## 2023-02-24 MED ORDER — ONDANSETRON HCL 4 MG PO TABS: 4.0000 mg | ORAL_TABLET | Freq: Four times a day (QID) | ORAL | 0 refills | Status: DC

## 2023-02-24 MED ORDER — HYDROMORPHONE HCL 1 MG/ML IJ SOLN
0.5000 mg | Freq: Once | INTRAMUSCULAR | Status: AC
  Administered 2023-02-24: 0.5 mg via INTRAVENOUS
  Filled 2023-02-24: qty 1

## 2023-02-24 NOTE — ED Notes (Signed)
Asked pt about a UA sample, pt states that they rarely are able to use the restroom due to their dialysis.

## 2023-02-24 NOTE — ED Provider Notes (Signed)
[place] EMERGENCY DEPARTMENT AT [facility] Provider Note   CSN: [id number] Arrival date & time: 02/24/23  1055     History  Chief Complaint  Patient presents with   Abdominal Pain   Nausea   Emesis   Back Pain    [name] is a 67 y.o. female.  67 year old female with prior medical history as detailed below presents for evaluation.  Patient patient reports history of chronic nausea, vomiting, abdominal pain.  Patient reports the symptoms been ongoing for the last 2 to 3 months.  She has had multiple prior workups for this same set of complaints without significant finding.  She has used both Zofran and promethazine at home with minimal improvement of her nausea.  She reports that she was seen at urgent care this past Wednesday for same complaint.  She reports that she was dialyzed on Thursday, and then this morning as per her normal dialysis schedule -which is Tuesday, Thursday, Saturday.  Patient reports that she received a call from the PA at urgent care this morning telling her to come to the ED for evaluation.  Patient tells this provider that there was a concern for worsening renal function.  Patient has been on dialysis for approximately a year.  Patient is known to Dr. Elnoria Howard with GI.  Most recent EGD was in May with Dr. Elnoria Howard.  Normal esophagus on EGD.  Mildly erythematous gastric lining and duodenopathy on EGD.  The history is provided by the patient and medical records.       Home Medications Prior to Admission medications   Medication Sig Start Date End Date Taking? Authorizing Provider  acetaminophen (TYLENOL) 325 MG tablet Take 2 tablets (650 mg total) by mouth every 6 (six) hours as needed for mild pain or moderate pain. 12/07/21   Arrien, York Ram, MD  albuterol (PROVENTIL HFA) 108 (90 Base) MCG/ACT inhaler Inhale 1-2 puffs into the lungs every 6 (six) hours as needed for wheezing or shortness of breath. 10/13/22   Grayce Sessions, NP   amiodarone (PACERONE) 200 MG tablet TAKE 1 TABLET BY MOUTH TWICE A DAY Patient taking differently: Take 200 mg by mouth daily. 04/25/22   Bensimhon, Bevelyn Buckles, MD  apixaban (ELIQUIS) 5 MG TABS tablet Take 1 tablet (5 mg total) by mouth 2 (two) times daily. 02/21/23   Bensimhon, Bevelyn Buckles, MD  Blood Glucose Monitoring Suppl (ACCU-CHEK GUIDE) w/Device KIT Use to check blood sugar TID. 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 Dx: E11.69 Last reading: 105, 02-20-2023, Fasting. 06/28/22   Hoy Register, MD  busPIRone (BUSPAR) 5 MG tablet TAKE ONE TABLET BY MOUTH TWICE DAILY @ 9AM & 5PM 09/06/22   Grayce Sessions, NP  colchicine 0.6 MG tablet Take 1/2 (one-half) tablet by mouth once daily 11/23/22   Hoy Register, MD  diphenhydrAMINE (BENADRYL) 25 mg capsule Take 25 mg by mouth every 6 (six) hours as needed for allergies or itching.    [provider]  Dulaglutide (TRULICITY) 1.5 MG/0.5ML SOPN Inject 1.5 mg into the skin once a week. 07/05/22   Grayce Sessions, NP  DULoxetine (CYMBALTA) 60 MG capsule TAKE ONE CAPSULE (60MG  TOTAL) BY MOUTH DAILY AT 9AM 02/12/23   Grayce Sessions, NP  EPINEPHrine 0.3 mg/0.3 mL IJ SOAJ injection Inject 0.3 mg into the muscle as needed for anaphylaxis. 06/25/20   Grayce Sessions, NP  fluticasone (FLONASE) 50 MCG/ACT nasal spray Place 2 sprays into both  nostrils daily as needed for allergies or rhinitis. 09/22/22   Grayce Sessions, NP  glucose blood (ACCU-CHEK GUIDE) test strip USE TO CHECK BLOOD SUGAR 3 TIMES DAILY 04/03/22   Hoy Register, MD  insulin lispro (HUMALOG KWIKPEN) 100 UNIT/ML KwikPen Inject 10 Units into the skin 3 (three) times daily. Patient taking differently: Inject 5-10 Units into the skin 3 (three) times daily. Sling scale 105= 1-2 units 106= 07/05/22   Grayce Sessions, NP  isosorbide mononitrate (IMDUR) 30 MG 24 hr tablet Take 0.5 tablets (15 mg total) by mouth daily. 03/18/22 12/29/22  Arrien, York Ram, MD  lidocaine-prilocaine (EMLA) cream Apply 1 Application topically Every Tuesday,Thursday,and Saturday with dialysis. 06/01/22   [provider]  linaclotide (LINZESS) 72 MCG capsule TAKE 1 CAPSULE (72 MCG TOTAL) BY MOUTH DAILY BEFORE BREAKFAST. IF BM NOT ACHIEVE WITH 1 CAPSULE SHE MAY TAKE 2 DAILY. STOP IF DIARRHEA OCCURS Patient taking differently: Take 72 mcg by mouth daily as needed (Constipation).  STOP IF DIARRHEA OCCURS 07/05/22   Grayce Sessions, NP  loratadine (CLARITIN) 10 MG tablet TAKE ONE TABLET BY MOUTH DAILY AT 9AM 09/06/22   Grayce Sessions, NP  metoprolol succinate (TOPROL-XL) 25 MG 24 hr tablet TAKE 1 TABLET BY MOUTH IN THE MORNING AND AT BEDTIME 09/12/22   Lanier Prude, MD  midodrine (PROAMATINE) 10 MG tablet Take 10 mg by mouth Every Tuesday,Thursday,and Saturday with dialysis. 30 min before Dialysis Take 10 mg 2 hours after on dialysis 08/16/22   [provider]  Multiple Vitamins-Minerals (MULTIVITAMIN WOMEN PO) Take 1 tablet by mouth daily.    [provider]  multivitamin (RENA-VIT) TABS tablet Take 1 tablet by mouth daily. 04/27/22   [provider]  nitroGLYCERIN (NITROSTAT) 0.4 MG SL tablet Place 1 tablet (0.4 mg total) under the tongue every 5 (five) minutes x 3 doses as needed for chest pain. 01/08/14   Rinaldo Cloud, MD  ondansetron (ZOFRAN) 4 MG tablet Take 4 mg by mouth every 8 (eight) hours. 02/06/23   [provider]  ondansetron (ZOFRAN-ODT) 4 MG disintegrating tablet Take 1 tablet (4 mg total) by mouth every 8 (eight) hours as needed for nausea or vomiting. 01/11/23   Gerhard Munch, MD  oxyCODONE-acetaminophen (PERCOCET) 5-325 MG tablet Take 1 tablet by mouth every 6 (six) hours as needed for severe pain. 01/05/23   Rhyne, Ames Coupe, PA-C  pantoprazole (PROTONIX) 40 MG tablet TAKE ONE TABLET BY MOUTH DAILY AT 9AM 04/17/22   Grayce Sessions, NP  polyethylene glycol powder (GLYCOLAX/MIRALAX) 17  GM/SCOOP powder Take 17 g by mouth daily. Patient taking differently: Take 17 g by mouth daily as needed for mild constipation. 02/25/22   Burnadette Pop, MD  pregabalin (LYRICA) 100 MG capsule Take 1 capsule (100 mg total) by mouth 3 (three) times daily as needed (for pain). 07/05/22   Grayce Sessions, NP  promethazine (PHENERGAN) 12.5 MG tablet Take 1 tablet (12.5 mg total) by mouth every 6 (six) hours as needed for nausea or vomiting. 02/21/23   Gustavus Bryant, FNP  RESTASIS 0.05 % ophthalmic emulsion Place 1 drop into both eyes in the morning and at bedtime. 11/19/17   [provider]  rosuvastatin (CRESTOR) 10 MG tablet TAKE ONE TABLET BY MOUTH DAILY AT 5PM 12/05/22   Bensimhon, Bevelyn Buckles, MD  senna-docusate (SENOKOT-S) 8.6-50 MG tablet Take 1 tablet by mouth 2 (two) times daily. 02/24/22   Burnadette Pop, MD  torsemide (DEMADEX) 100 MG tablet  Take 100 mg by mouth See admin instructions.  Take Sunday,Monday, Wednesday and Friday 04/02/22   [provider]      Allergies    Bee pollen, Bee venom, Sulfa antibiotics, and Iodinated contrast media    Review of Systems   Review of Systems  All other systems reviewed and are negative.   Physical Exam Updated Vital Signs BP 94/63   Pulse 100   Temp 98.2 F (36.8 C) (Oral)   Resp 18   SpO2 98%  Physical Exam Vitals and nursing note reviewed.  Constitutional:      General: She is not in acute distress.    Appearance: Normal appearance. She is well-developed.  HENT:     Head: Normocephalic and atraumatic.  Eyes:     Conjunctiva/sclera: Conjunctivae normal.     Pupils: Pupils are equal, round, and reactive to light.  Cardiovascular:     Rate and Rhythm: Normal rate and regular rhythm.     Heart sounds: Normal heart sounds.  Pulmonary:     Effort: Pulmonary effort is normal. No respiratory distress.     Breath sounds: Normal breath sounds.  Abdominal:     General: There is no distension.     Palpations: Abdomen is  soft.     Tenderness: There is generalized abdominal tenderness.  Musculoskeletal:        General: No deformity. Normal range of motion.     Cervical back: Normal range of motion and neck supple.  Skin:    General: Skin is warm and dry.  Neurological:     General: No focal deficit present.     Mental Status: She is alert and oriented to person, place, and time.     ED Results / Procedures / Treatments   Labs (all labs ordered are listed, but only abnormal results are displayed) Labs Reviewed  COMPREHENSIVE METABOLIC PANEL - Abnormal; Notable for the following components:      Result Value   Glucose, Bld 153 (*)    Creatinine, Ser 5.44 (*)    GFR, Estimated 8 (*)    All other components within normal limits  CBC - Abnormal; Notable for the following components:   RBC 3.66 (*)    Hemoglobin 11.2 (*)    HCT 35.4 (*)    All other components within normal limits  LIPASE, BLOOD  URINALYSIS, ROUTINE W REFLEX MICROSCOPIC    EKG None  Radiology No results found.  Procedures Procedures    Medications Ordered in ED Medications  HYDROmorphone (DILAUDID) injection 0.5 mg (0.5 mg Intravenous Given 02/24/23 1443)  ondansetron (ZOFRAN) injection 4 mg (4 mg Intravenous Given 02/24/23 1442)    ED Course/ Medical Decision Making/ A&P                             Medical Decision Making Amount and/or Complexity of Data Reviewed Labs: ordered. Radiology: ordered.  Risk Prescription drug management.    Medical Screen Complete  This patient presented to the ED with complaint of chronic abdominal pain, nausea, vomiting.  This complaint involves an extensive number of treatment options. The initial differential diagnosis includes, but is not limited to, Bolick abnormality, intra-abdominal pathology such as obstruction, infection, etc.  This presentation is: Chronic, Self-Limited, Previously Undiagnosed, Uncertain Prognosis, Complicated, Systemic Symptoms, and Threat to  Life/Bodily Function  Patient complains of chronic nausea and vomiting.  Patient without witnessed episode of emesis here in the ED.  Patient  reports that she was advised to seek ER evaluation by urgent care PA that she saw middle of this week.  Patient without acute complaint.  Screening labs obtained are without significant abnormality given patient's history of ESRD on Tuesday, Thursday, Saturday dialysis.  Imaging and labs are without significant abnormality requiring additional ED workup and/or evaluation and/or admission.  Patient does understand need for close outpatient follow-up.  Strict return precautions given and understood.  Additional history obtained: External records from outside sources obtained and reviewed including prior ED visits and prior Inpatient records.    Lab Tests:  I ordered and personally interpreted labs.   Imaging Studies ordered:  I ordered imaging studies including CT abdomen pelvis, chest x-ray I independently visualized and interpreted obtained imaging which showed NAD I agree with the radiologist interpretation.  Problem List / ED Course:  Chronic nausea, vomiting   Reevaluation:  After the interventions noted above, I reevaluated the patient and found that they have: stayed the same   Disposition:  After consideration of the diagnostic results and the patients response to treatment, I feel that the patent would benefit from close outpatient follow-up.          Final Clinical Impression(s) / ED Diagnoses Final diagnoses:  Nausea    Rx / DC Orders ED Discharge Orders          Ordered    ondansetron (ZOFRAN) 4 MG tablet  Every 6 hours        02/24/23 1654              Wynetta Fines, MD 02/24/23 1656

## 2023-02-24 NOTE — ED Provider Triage Note (Signed)
Emergency Medicine Provider Triage Evaluation Note  [name] , a 67 y.o. female  was evaluated in triage.  Pt complains of nausea, vomiting, abdominal pain.  Symptoms have been present for the past 3 months.  She has had extensive workups for this without finding a cause.  She has been on Zofran and promethazine with minimal improvement.  She was able to tolerate a small amount of food prior to arrival today.  She presented to urgent care 3 days ago and had basic labs drawn.  She was called today and told to come to the emergency room for worsened kidney function.  She is an ESRD patient and dialyzes Tuesday, Thursday and Saturday.  She finished her session today.  She still produces a small amount of urine.  Review of Systems  Positive: As above Negative: As above  Physical Exam  BP 93/69 (BP Location: Right Arm)   Pulse (!) 105   Temp 98.2 F (36.8 C) (Oral)   Resp 18   SpO2 99%  Gen:   Awake, no distress   Resp:  Normal effort  MSK:   Moves extremities without difficulty  Other:  Moderate tenderness to the right side of the abdomen  Medical Decision Making  Medically screening exam initiated at 11:53 AM.  Appropriate orders placed.  Amelyah Bargerstock was informed that the remainder of the evaluation will be completed by another provider, this initial triage assessment does not replace that evaluation, and the importance of remaining in the ED until their evaluation is complete.  Workup initiated   Michelle Piper, Cordelia Poche 02/24/23 1153

## 2023-02-24 NOTE — Discharge Instructions (Signed)
Return for any problem.  ?

## 2023-02-24 NOTE — ED Triage Notes (Addendum)
Pt. Stated, I started having abdominal pain with N/V and back pain a few months ago. I went to UC and they told me to come here. Im a dialysis pt and they told me I was really sick to come here. Last dialysis today, completed treatment

## 2023-03-14 ENCOUNTER — Encounter (HOSPITAL_COMMUNITY): Payer: Self-pay | Admitting: Emergency Medicine

## 2023-03-14 ENCOUNTER — Emergency Department (HOSPITAL_COMMUNITY): Payer: 59

## 2023-03-14 ENCOUNTER — Encounter: Payer: Self-pay | Admitting: Podiatry

## 2023-03-14 ENCOUNTER — Inpatient Hospital Stay (HOSPITAL_COMMUNITY)
Admission: EM | Admit: 2023-03-14 | Discharge: 2023-03-18 | DRG: 312 | Disposition: A | Discharge status: Home / Self Care | Payer: 59 | Attending: Internal Medicine | Admitting: Internal Medicine

## 2023-03-14 ENCOUNTER — Other Ambulatory Visit: Payer: Self-pay

## 2023-03-14 ENCOUNTER — Ambulatory Visit (INDEPENDENT_AMBULATORY_CARE_PROVIDER_SITE_OTHER): Payer: 59 | Admitting: Podiatry

## 2023-03-14 VITALS — BP 100/66 | HR 57

## 2023-03-14 DIAGNOSIS — R42 Dizziness and giddiness: Secondary | ICD-10-CM

## 2023-03-14 DIAGNOSIS — E785 Hyperlipidemia, unspecified: Secondary | ICD-10-CM | POA: Diagnosis present

## 2023-03-14 DIAGNOSIS — N2581 Secondary hyperparathyroidism of renal origin: Secondary | ICD-10-CM | POA: Diagnosis present

## 2023-03-14 DIAGNOSIS — K3184 Gastroparesis: Secondary | ICD-10-CM | POA: Diagnosis present

## 2023-03-14 DIAGNOSIS — Z803 Family history of malignant neoplasm of breast: Secondary | ICD-10-CM

## 2023-03-14 DIAGNOSIS — I132 Hypertensive heart and chronic kidney disease with heart failure and with stage 5 chronic kidney disease, or end stage renal disease: Secondary | ICD-10-CM | POA: Diagnosis present

## 2023-03-14 DIAGNOSIS — I959 Hypotension, unspecified: Secondary | ICD-10-CM | POA: Diagnosis present

## 2023-03-14 DIAGNOSIS — E1169 Type 2 diabetes mellitus with other specified complication: Secondary | ICD-10-CM | POA: Diagnosis not present

## 2023-03-14 DIAGNOSIS — Z86718 Personal history of other venous thrombosis and embolism: Secondary | ICD-10-CM

## 2023-03-14 DIAGNOSIS — I953 Hypotension of hemodialysis: Secondary | ICD-10-CM

## 2023-03-14 DIAGNOSIS — I5022 Chronic systolic (congestive) heart failure: Secondary | ICD-10-CM | POA: Diagnosis present

## 2023-03-14 DIAGNOSIS — Z7985 Long-term (current) use of injectable non-insulin antidiabetic drugs: Secondary | ICD-10-CM

## 2023-03-14 DIAGNOSIS — I4811 Longstanding persistent atrial fibrillation: Secondary | ICD-10-CM

## 2023-03-14 DIAGNOSIS — F419 Anxiety disorder, unspecified: Secondary | ICD-10-CM | POA: Diagnosis present

## 2023-03-14 DIAGNOSIS — G4733 Obstructive sleep apnea (adult) (pediatric): Secondary | ICD-10-CM

## 2023-03-14 DIAGNOSIS — E1122 Type 2 diabetes mellitus with diabetic chronic kidney disease: Secondary | ICD-10-CM | POA: Diagnosis present

## 2023-03-14 DIAGNOSIS — I951 Orthostatic hypotension: Secondary | ICD-10-CM | POA: Diagnosis not present

## 2023-03-14 DIAGNOSIS — Z8249 Family history of ischemic heart disease and other diseases of the circulatory system: Secondary | ICD-10-CM

## 2023-03-14 DIAGNOSIS — F32A Depression, unspecified: Secondary | ICD-10-CM | POA: Diagnosis present

## 2023-03-14 DIAGNOSIS — D631 Anemia in chronic kidney disease: Secondary | ICD-10-CM | POA: Diagnosis present

## 2023-03-14 DIAGNOSIS — Z91030 Bee allergy status: Secondary | ICD-10-CM

## 2023-03-14 DIAGNOSIS — Z79899 Other long term (current) drug therapy: Secondary | ICD-10-CM

## 2023-03-14 DIAGNOSIS — Z91199 Patient's noncompliance with other medical treatment and regimen due to unspecified reason: Secondary | ICD-10-CM

## 2023-03-14 DIAGNOSIS — Z882 Allergy status to sulfonamides status: Secondary | ICD-10-CM

## 2023-03-14 DIAGNOSIS — J449 Chronic obstructive pulmonary disease, unspecified: Secondary | ICD-10-CM | POA: Diagnosis present

## 2023-03-14 DIAGNOSIS — N186 End stage renal disease: Secondary | ICD-10-CM

## 2023-03-14 DIAGNOSIS — Z6841 Body Mass Index (BMI) 40.0 and over, adult: Secondary | ICD-10-CM

## 2023-03-14 DIAGNOSIS — M1990 Unspecified osteoarthritis, unspecified site: Secondary | ICD-10-CM | POA: Diagnosis present

## 2023-03-14 DIAGNOSIS — Z1152 Encounter for screening for COVID-19: Secondary | ICD-10-CM

## 2023-03-14 DIAGNOSIS — I9589 Other hypotension: Secondary | ICD-10-CM | POA: Diagnosis present

## 2023-03-14 DIAGNOSIS — I480 Paroxysmal atrial fibrillation: Secondary | ICD-10-CM | POA: Diagnosis present

## 2023-03-14 DIAGNOSIS — Z794 Long term (current) use of insulin: Secondary | ICD-10-CM

## 2023-03-14 DIAGNOSIS — Z91041 Radiographic dye allergy status: Secondary | ICD-10-CM

## 2023-03-14 DIAGNOSIS — I340 Nonrheumatic mitral (valve) insufficiency: Secondary | ICD-10-CM | POA: Diagnosis present

## 2023-03-14 DIAGNOSIS — E1143 Type 2 diabetes mellitus with diabetic autonomic (poly)neuropathy: Secondary | ICD-10-CM | POA: Diagnosis present

## 2023-03-14 DIAGNOSIS — R9431 Abnormal electrocardiogram [ECG] [EKG]: Secondary | ICD-10-CM | POA: Diagnosis present

## 2023-03-14 DIAGNOSIS — Z992 Dependence on renal dialysis: Secondary | ICD-10-CM

## 2023-03-14 DIAGNOSIS — K219 Gastro-esophageal reflux disease without esophagitis: Secondary | ICD-10-CM | POA: Diagnosis present

## 2023-03-14 DIAGNOSIS — R200 Anesthesia of skin: Secondary | ICD-10-CM | POA: Diagnosis present

## 2023-03-14 DIAGNOSIS — Z7901 Long term (current) use of anticoagulants: Secondary | ICD-10-CM

## 2023-03-14 DIAGNOSIS — J4489 Other specified chronic obstructive pulmonary disease: Secondary | ICD-10-CM | POA: Diagnosis present

## 2023-03-14 LAB — TROPONIN I (HIGH SENSITIVITY)
Troponin I (High Sensitivity): 7 ng/L (ref <18)
Troponin I (High Sensitivity): 8 ng/L (ref <18)

## 2023-03-14 LAB — BASIC METABOLIC PANEL
Anion gap: 14 (ref 5–15)
BUN: 27 mg/dL — ABNORMAL HIGH (ref 8–23)
CO2: 28 mmol/L (ref 22–32)
Calcium: 9.4 mg/dL (ref 8.9–10.3)
Chloride: 96 mmol/L — ABNORMAL LOW (ref 98–111)
Creatinine, Ser: 7.48 mg/dL — ABNORMAL HIGH (ref 0.44–1.00)
GFR, Estimated: 6 mL/min — ABNORMAL LOW (ref >60)
Glucose, Bld: 124 mg/dL — ABNORMAL HIGH (ref 70–99)
Potassium: 4.7 mmol/L (ref 3.5–5.1)
Sodium: 138 mmol/L (ref 135–145)

## 2023-03-14 LAB — CBC
HCT: 34.3 % — ABNORMAL LOW (ref 36.0–46.0)
Hemoglobin: 10.7 g/dL — ABNORMAL LOW (ref 12.0–15.0)
MCH: 31.2 pg (ref 26.0–34.0)
MCHC: 31.2 g/dL (ref 30.0–36.0)
MCV: 100 fL (ref 80.0–100.0)
Platelets: 148 10*3/uL — ABNORMAL LOW (ref 150–400)
RBC: 3.43 MIL/uL — ABNORMAL LOW (ref 3.87–5.11)
RDW: 14.9 % (ref 11.5–15.5)
WBC: 5.9 10*3/uL (ref 4.0–10.5)
nRBC: 0 % (ref 0.0–0.2)

## 2023-03-14 LAB — BRAIN NATRIURETIC PEPTIDE: B Natriuretic Peptide: 152.5 pg/mL — ABNORMAL HIGH (ref 0.0–100.0)

## 2023-03-14 MED ORDER — ACETAMINOPHEN 650 MG RE SUPP: 650.0000 mg | Freq: Four times a day (QID) | RECTAL | Status: DC | PRN

## 2023-03-14 MED ORDER — BUSPIRONE HCL 5 MG PO TABS
5.0000 mg | ORAL_TABLET | Freq: Two times a day (BID) | ORAL | Status: DC
  Administered 2023-03-14 – 2023-03-18 (×8): 5 mg via ORAL
  Filled 2023-03-14 (×8): qty 1

## 2023-03-14 MED ORDER — MIDAZOLAM HCL 2 MG/2ML IJ SOLN
2.0000 mg | Freq: Once | INTRAMUSCULAR | Status: AC | PRN
  Administered 2023-03-15: 2 mg via INTRAVENOUS
  Filled 2023-03-14: qty 2

## 2023-03-14 MED ORDER — PANTOPRAZOLE SODIUM 40 MG PO TBEC
40.0000 mg | DELAYED_RELEASE_TABLET | Freq: Every day | ORAL | Status: DC
  Administered 2023-03-15 – 2023-03-18 (×4): 40 mg via ORAL
  Filled 2023-03-14 (×4): qty 1

## 2023-03-14 MED ORDER — METOPROLOL TARTRATE 5 MG/5ML IV SOLN
5.0000 mg | Freq: Once | INTRAVENOUS | Status: AC
  Administered 2023-03-14: 5 mg via INTRAVENOUS
  Filled 2023-03-14: qty 5

## 2023-03-14 MED ORDER — ONDANSETRON HCL 4 MG/2ML IJ SOLN
4.0000 mg | Freq: Once | INTRAMUSCULAR | Status: AC
  Administered 2023-03-14: 4 mg via INTRAVENOUS
  Filled 2023-03-14: qty 2

## 2023-03-14 MED ORDER — DULOXETINE HCL 60 MG PO CPEP
60.0000 mg | ORAL_CAPSULE | Freq: Every day | ORAL | Status: DC
  Administered 2023-03-15 – 2023-03-18 (×4): 60 mg via ORAL
  Filled 2023-03-14 (×3): qty 1
  Filled 2023-03-14: qty 2

## 2023-03-14 MED ORDER — APIXABAN 5 MG PO TABS
5.0000 mg | ORAL_TABLET | Freq: Two times a day (BID) | ORAL | Status: DC
  Administered 2023-03-14 – 2023-03-18 (×8): 5 mg via ORAL
  Filled 2023-03-14 (×8): qty 1

## 2023-03-14 MED ORDER — ACETAMINOPHEN 325 MG PO TABS
650.0000 mg | ORAL_TABLET | Freq: Once | ORAL | Status: AC
  Administered 2023-03-14: 650 mg via ORAL
  Filled 2023-03-14: qty 2

## 2023-03-14 MED ORDER — AMIODARONE HCL 200 MG PO TABS
200.0000 mg | ORAL_TABLET | Freq: Every day | ORAL | Status: DC
  Administered 2023-03-15 – 2023-03-18 (×4): 200 mg via ORAL
  Filled 2023-03-14 (×4): qty 1

## 2023-03-14 MED ORDER — MECLIZINE HCL 25 MG PO TABS
25.0000 mg | ORAL_TABLET | Freq: Three times a day (TID) | ORAL | Status: DC | PRN
  Filled 2023-03-14 (×2): qty 1

## 2023-03-14 MED ORDER — LIDOCAINE VISCOUS HCL 2 % MT SOLN
15.0000 mL | Freq: Once | OROMUCOSAL | Status: AC
  Administered 2023-03-14: 15 mL via ORAL
  Filled 2023-03-14: qty 15

## 2023-03-14 MED ORDER — SODIUM CHLORIDE 0.9% FLUSH
3.0000 mL | Freq: Two times a day (BID) | INTRAVENOUS | Status: DC
  Administered 2023-03-14 – 2023-03-17 (×7): 3 mL via INTRAVENOUS

## 2023-03-14 MED ORDER — INSULIN ASPART 100 UNIT/ML IJ SOLN
0.0000 [IU] | Freq: Three times a day (TID) | INTRAMUSCULAR | Status: DC
  Administered 2023-03-15: 1 [IU] via SUBCUTANEOUS

## 2023-03-14 MED ORDER — HYDROMORPHONE HCL 1 MG/ML IJ SOLN
1.0000 mg | Freq: Once | INTRAMUSCULAR | Status: AC
  Administered 2023-03-14: 1 mg via INTRAVENOUS
  Filled 2023-03-14: qty 1

## 2023-03-14 MED ORDER — ALUM & MAG HYDROXIDE-SIMETH 200-200-20 MG/5ML PO SUSP
30.0000 mL | Freq: Once | ORAL | Status: AC
  Administered 2023-03-14: 30 mL via ORAL
  Filled 2023-03-14: qty 30

## 2023-03-14 MED ORDER — ACETAMINOPHEN 325 MG PO TABS
650.0000 mg | ORAL_TABLET | Freq: Four times a day (QID) | ORAL | Status: DC | PRN
  Administered 2023-03-15 – 2023-03-17 (×4): 650 mg via ORAL
  Filled 2023-03-14 (×4): qty 2

## 2023-03-14 MED ORDER — HYDROMORPHONE HCL 1 MG/ML IJ SOLN
0.5000 mg | INTRAMUSCULAR | Status: AC | PRN
  Administered 2023-03-14 – 2023-03-16 (×2): 0.5 mg via INTRAVENOUS
  Filled 2023-03-14 (×2): qty 1

## 2023-03-14 MED ORDER — COLCHICINE 0.6 MG PO TABS
0.6000 mg | ORAL_TABLET | Freq: Every day | ORAL | Status: DC
  Administered 2023-03-15 – 2023-03-16 (×2): 0.6 mg via ORAL
  Filled 2023-03-14 (×2): qty 1

## 2023-03-14 MED ORDER — METOPROLOL SUCCINATE ER 25 MG PO TB24
25.0000 mg | ORAL_TABLET | Freq: Every day | ORAL | Status: DC
  Administered 2023-03-15: 25 mg via ORAL
  Filled 2023-03-14 (×2): qty 1

## 2023-03-14 MED ORDER — MIDAZOLAM HCL 2 MG/2ML IJ SOLN
2.0000 mg | Freq: Once | INTRAMUSCULAR | Status: AC
  Administered 2023-03-14: 2 mg via INTRAVENOUS
  Filled 2023-03-14: qty 2

## 2023-03-14 MED ORDER — INSULIN ASPART 100 UNIT/ML IJ SOLN: 0.0000 [IU] | Freq: Every day | INTRAMUSCULAR | Status: DC

## 2023-03-14 MED ORDER — ROSUVASTATIN CALCIUM 5 MG PO TABS
10.0000 mg | ORAL_TABLET | Freq: Every day | ORAL | Status: DC
  Administered 2023-03-15 – 2023-03-18 (×4): 10 mg via ORAL
  Filled 2023-03-14 (×4): qty 2

## 2023-03-14 MED ORDER — DIPHENHYDRAMINE HCL 50 MG/ML IJ SOLN
25.0000 mg | Freq: Once | INTRAMUSCULAR | Status: AC
  Administered 2023-03-14: 25 mg via INTRAVENOUS
  Filled 2023-03-14: qty 1

## 2023-03-14 NOTE — H&P (Signed)
History and Physical    [name] ZOX:[id number] DOB: 07/15/1956 DOA: 03/14/2023  PCP: Grayce Sessions, NP   Patient coming from: Home   Chief Complaint: Lightheaded, dizzy, left-side numbness   HPI: [name] is a 67 y.o. female with medical history significant for type 2 diabetes mellitus, hypertension, COPD, chronic HFpEF, OSA on CPAP, history of DVT, atrial fibrillation on Eliquis, and ESRD on hemodialysis who presents to the emergency department with lightheadedness, dizziness, and left-sided numbness.  Patient began to notice left-sided numbness, room spinning sensation, and worsening lightheadedness upon standing 2 or 3 days ago.  She has been feeling as though she might pass out whenever she stands up but has also had intermittent room spinning sensation, even while sitting or lying down.  Additionally, patient reports numbness involving the left face, left arm, and left leg.  She reports completing dialysis yesterday but had some bleeding from her fistula afterwards and had acute worsening in her lightheadedness with treatment.  ED Course: Upon arrival to the ED, patient is found to be afebrile and saturating upper 90s on room air with labile heart rate and blood pressure.  EKG demonstrates atrial fibrillation.  Chest x-ray notable for cardiomegaly and pulmonary vascular congestion.  No acute findings noted on head CT.  Labs are most notable for normal potassium, normal bicarbonate, BUN 27, hemoglobin 10.7, normal troponin x 2, and BNP 153.  Patient was treated with GI cocktail, Benadryl, Dilaudid, Versed, Zofran, and Lopressor in the ED.  Review of Systems:  All other systems reviewed and apart from HPI, are negative.  Past Medical History:  Diagnosis Date   Acute on chronic systolic CHF (congestive heart failure) (HCC) 12/21/2020   Allergic rhinitis    Anemia    Arthritis    Asthma    Chronic diastolic CHF (congestive heart failure) (HCC)     Chronic kidney disease    Dialysis Tu/TH/Sa   COPD (chronic obstructive pulmonary disease) (HCC)    Depression    DM (diabetes mellitus) (HCC)    DVT (deep vein thrombosis) in pregnancy    GERD (gastroesophageal reflux disease)    HTN (hypertension)    Hyperlipidemia    Obesity    Pneumonia 04/2018   RIGHT LOBE   Sleep apnea    needs to be retested - to get new cpap    Past Surgical History:  Procedure Laterality Date   ABDOMINAL HYSTERECTOMY     AV FISTULA PLACEMENT Left 03/17/2022   Procedure: LEFT ARM ARTERIOVENOUS (AV) FISTULA CREATION;  Surgeon: Chuck Hint, MD;  Location: [facility] OR;  Service: Vascular;  Laterality: Left;   BIOPSY  01/12/2023   Procedure: BIOPSY;  Surgeon: Jeani Hawking, MD;  Location: [facility] ENDOSCOPY;  Service: Gastroenterology;;   Thressa Sheller STUDY  11/26/2020   Procedure: BUBBLE STUDY;  Surgeon: Jodelle Red, MD;  Location: [facility] ENDOSCOPY;  Service: Cardiovascular;;   CARDIOVERSION N/A 05/06/2020   Procedure: CARDIOVERSION;  Surgeon: Jodelle Red, MD;  Location: [facility] ENDOSCOPY;  Service: Cardiovascular;  Laterality: N/A;   CARDIOVERSION N/A 11/04/2020   Procedure: CARDIOVERSION;  Surgeon: Lewayne Bunting, MD;  Location: [facility] ENDOSCOPY;  Service: Cardiovascular;  Laterality: N/A;   CARDIOVERSION N/A 02/01/2021   Procedure: CARDIOVERSION;  Surgeon: Dolores Patty, MD;  Location: [facility] ENDOSCOPY;  Service: Cardiovascular;  Laterality: N/A;   CARDIOVERSION N/A 02/09/2021   Procedure: CARDIOVERSION;  Surgeon: Dolores Patty, MD;  Location: [facility] ENDOSCOPY;  Service: Cardiovascular;  Laterality: N/A;   CARDIOVERSION N/A  04/19/2021   Procedure: CARDIOVERSION;  Surgeon: Pricilla Riffle, MD;  Location: [facility] ENDOSCOPY;  Service: Cardiovascular;  Laterality: N/A;   CARDIOVERSION N/A 11/25/2021   Procedure: CARDIOVERSION;  Surgeon: Dolores Patty, MD;  Location: [facility] ENDOSCOPY;  Service: Cardiovascular;  Laterality: N/A;   CARDIOVERSION N/A 01/31/2022    Procedure: CARDIOVERSION;  Surgeon: Dolores Patty, MD;  Location: [facility] ENDOSCOPY;  Service: Cardiovascular;  Laterality: N/A;   CATARACT EXTRACTION, BILATERAL     CHOLECYSTECTOMY     COLONOSCOPY WITH PROPOFOL N/A 03/05/2015   Procedure: COLONOSCOPY WITH PROPOFOL;  Surgeon: Jeani Hawking, MD;  Location: [facility] ENDOSCOPY;  Service: Endoscopy;  Laterality: N/A;   DIAGNOSTIC LAPAROSCOPY     ESOPHAGOGASTRODUODENOSCOPY (EGD) WITH PROPOFOL N/A 01/12/2023   Procedure: ESOPHAGOGASTRODUODENOSCOPY (EGD) WITH PROPOFOL;  Surgeon: Jeani Hawking, MD;  Location: [facility] ENDOSCOPY;  Service: Gastroenterology;  Laterality: N/A;   FISTULA SUPERFICIALIZATION Left 03/17/2022   Procedure: FISTULA SUPERFICIALIZATION -LEFT;  Surgeon: Chuck Hint, MD;  Location: [facility] OR;  Service: Vascular;  Laterality: Left;   ingrown hallux Left    IR FLUORO GUIDE CV LINE RIGHT  03/14/2022   IR US GUIDE VASC ACCESS RIGHT  03/14/2022   KNEE SURGERY     LEFT HEART CATH AND CORONARY ANGIOGRAPHY N/A 12/31/2017   Procedure: LEFT HEART CATH AND CORONARY ANGIOGRAPHY;  Surgeon: Rinaldo Cloud, MD;  Location: [facility] INVASIVE CV LAB;  Service: Cardiovascular;  Laterality: N/A;   MAZE N/A 12/29/2020   Procedure: MAZE;  Surgeon: Loreli Slot, MD;  Location: [facility] OR;  Service: Open Heart Surgery;  Laterality: N/A;   MITRAL VALVE REPAIR N/A 12/29/2020   Procedure: MITRAL VALVE REPAIR USING CARBOMEDICS ANNULOFLEX RING SIZE 30;  Surgeon: Loreli Slot, MD;  Location: [facility] OR;  Service: Open Heart Surgery;  Laterality: N/A;   REVISON OF ARTERIOVENOUS FISTULA Left 01/05/2023   Procedure: REVISION OF LEFT ARTERIOVENOUS FISTULA WITH INTERPOSITION PTFE GRAFT;  Surgeon: Chuck Hint, MD;  Location: [facility] OR;  Service: Vascular;  Laterality: Left;  ELIQUIS AND HOLD 48 X HOURS   RIGHT HEART CATH N/A 12/22/2020   Procedure: RIGHT HEART CATH;  Surgeon: Laurey Morale, MD;  Location: [facility] INVASIVE CV LAB;  Service: Cardiovascular;  Laterality: N/A;    RIGHT HEART CATH N/A 01/31/2021   Procedure: RIGHT HEART CATH;  Surgeon: Dolores Patty, MD;  Location: [facility] INVASIVE CV LAB;  Service: Cardiovascular;  Laterality: N/A;   RIGHT HEART CATH N/A 07/29/2021   Procedure: RIGHT HEART CATH;  Surgeon: Dolores Patty, MD;  Location: [facility] INVASIVE CV LAB;  Service: Cardiovascular;  Laterality: N/A;   RIGHT HEART CATH N/A 11/24/2021   Procedure: RIGHT HEART CATH;  Surgeon: Dolores Patty, MD;  Location: [facility] INVASIVE CV LAB;  Service: Cardiovascular;  Laterality: N/A;   RIGHT HEART CATH N/A 02/14/2022   Procedure: RIGHT HEART CATH;  Surgeon: Dolores Patty, MD;  Location: [facility] INVASIVE CV LAB;  Service: Cardiovascular;  Laterality: N/A;   RIGHT HEART CATH N/A 02/22/2022   Procedure: RIGHT HEART CATH;  Surgeon: Laurey Morale, MD;  Location: [facility] INVASIVE CV LAB;  Service: Cardiovascular;  Laterality: N/A;   TEE WITHOUT CARDIOVERSION N/A 11/26/2020   Procedure: TRANSESOPHAGEAL ECHOCARDIOGRAM (TEE);  Surgeon: Jodelle Red, MD;  Location: [facility] ENDOSCOPY;  Service: Cardiovascular;  Laterality: N/A;   TEE WITHOUT CARDIOVERSION N/A 12/29/2020   Procedure: TRANSESOPHAGEAL ECHOCARDIOGRAM (TEE);  Surgeon: Loreli Slot, MD;  Location: [facility] OR;  Service: Open Heart Surgery;  Laterality: N/A;   TEE  WITHOUT CARDIOVERSION N/A 01/20/2022   Procedure: TRANSESOPHAGEAL ECHOCARDIOGRAM (TEE);  Surgeon: Dolores Patty, MD;  Location: [facility] ENDOSCOPY;  Service: Cardiovascular;  Laterality: N/A;   TRICUSPID VALVE REPLACEMENT N/A 12/29/2020   Procedure: 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 RING SIZE 34;  Surgeon: Loreli Slot, MD;  Location: [facility] OR;  Service: Open Heart Surgery;  Laterality: N/A;    Social History:   reports that she has never smoked. She has never been exposed to tobacco smoke. She has never used smokeless tobacco. She reports that she does not drink alcohol and does not use drugs.  Allergies  Allergen Reactions   Bee  Pollen Anaphylaxis and Other (See Comments)    UNCONFIRMED, but IS allergic to bee VENOM   Bee Venom Anaphylaxis   Sulfa Antibiotics Itching   Iodinated Contrast Media Nausea And Vomiting    Family History  Problem Relation Age of Onset   Breast cancer Mother    Cancer Mother    Hypertension Mother    Cancer Father    Hypertension Sister    Hypertension Brother    CAD Other      Prior to Admission medications   Medication Sig Start Date End Date Taking? Authorizing Provider  acetaminophen (TYLENOL) 325 MG tablet Take 2 tablets (650 mg total) by mouth every 6 (six) hours as needed for mild pain or moderate pain. 12/07/21   Arrien, York Ram, MD  albuterol (PROVENTIL HFA) 108 (90 Base) MCG/ACT inhaler Inhale 1-2 puffs into the lungs every 6 (six) hours as needed for wheezing or shortness of breath. 10/13/22   Grayce Sessions, NP  amiodarone (PACERONE) 200 MG tablet TAKE 1 TABLET BY MOUTH TWICE A DAY Patient taking differently: Take 200 mg by mouth daily. 04/25/22   Bensimhon, Bevelyn Buckles, MD  apixaban (ELIQUIS) 5 MG TABS tablet Take 1 tablet (5 mg total) by mouth 2 (two) times daily. 02/21/23   Bensimhon, Bevelyn Buckles, MD  Blood Glucose Monitoring Suppl (ACCU-CHEK GUIDE) w/Device KIT Use to check blood sugar TID. 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 Dx: E11.69 Last reading: 105, 02-20-2023, Fasting. 06/28/22   Hoy Register, MD  busPIRone (BUSPAR) 5 MG tablet TAKE ONE TABLET BY MOUTH TWICE DAILY @ 9AM & 5PM 09/06/22   Grayce Sessions, NP  colchicine 0.6 MG tablet Take 1/2 (one-half) tablet by mouth once daily 11/23/22   Hoy Register, MD  diphenhydrAMINE (BENADRYL) 25 mg capsule Take 25 mg by mouth every 6 (six) hours as needed for allergies or itching.    [provider]  Dulaglutide (TRULICITY) 1.5 MG/0.5ML SOPN Inject 1.5 mg into the skin once a week. 07/05/22   Grayce Sessions, NP  DULoxetine (CYMBALTA) 60 MG capsule TAKE ONE CAPSULE  (60MG  TOTAL) BY MOUTH DAILY AT 9AM 02/12/23   Grayce Sessions, NP  EPINEPHrine 0.3 mg/0.3 mL IJ SOAJ injection Inject 0.3 mg into the muscle as needed for anaphylaxis. 06/25/20   Grayce Sessions, NP  fluticasone (FLONASE) 50 MCG/ACT nasal spray Place 2 sprays into both nostrils daily as needed for allergies or rhinitis. 09/22/22   Grayce Sessions, NP  glucose blood (ACCU-CHEK GUIDE) test strip USE TO CHECK BLOOD SUGAR 3 TIMES DAILY 04/03/22   Hoy Register, MD  insulin lispro (HUMALOG KWIKPEN) 100 UNIT/ML KwikPen Inject 10 Units into the skin 3 (three) times daily. Patient taking differently: Inject 5-10 Units into the skin 3 (three) times daily. Sling scale 105= 1-2 units  106= 07/05/22   Grayce Sessions, NP  isosorbide mononitrate (IMDUR) 30 MG 24 hr tablet Take 0.5 tablets (15 mg total) by mouth daily. 03/18/22 12/29/22  Arrien, York Ram, MD  lidocaine-prilocaine (EMLA) cream Apply 1 Application topically Every Tuesday,Thursday,and Saturday with dialysis. 06/01/22   [provider]  linaclotide (LINZESS) 72 MCG capsule TAKE 1 CAPSULE (72 MCG TOTAL) BY MOUTH DAILY BEFORE BREAKFAST. IF BM NOT ACHIEVE WITH 1 CAPSULE SHE MAY TAKE 2 DAILY. STOP IF DIARRHEA OCCURS Patient taking differently: Take 72 mcg by mouth daily as needed (Constipation).  STOP IF DIARRHEA OCCURS 07/05/22   Grayce Sessions, NP  loratadine (CLARITIN) 10 MG tablet TAKE ONE TABLET BY MOUTH DAILY AT 9AM 09/06/22   Grayce Sessions, NP  metoprolol succinate (TOPROL-XL) 25 MG 24 hr tablet TAKE 1 TABLET BY MOUTH IN THE MORNING AND AT BEDTIME 09/12/22   Lanier Prude, MD  midodrine (PROAMATINE) 10 MG tablet Take 10 mg by mouth Every Tuesday,Thursday,and Saturday with dialysis. 30 min before Dialysis Take 10 mg 2 hours after on dialysis 08/16/22   [provider]  Multiple Vitamins-Minerals (MULTIVITAMIN WOMEN PO) Take 1 tablet by mouth daily.    [provider]  multivitamin (RENA-VIT)  TABS tablet Take 1 tablet by mouth daily. 04/27/22   [provider]  nitroGLYCERIN (NITROSTAT) 0.4 MG SL tablet Place 1 tablet (0.4 mg total) under the tongue every 5 (five) minutes x 3 doses as needed for chest pain. 01/08/14   Rinaldo Cloud, MD  ondansetron (ZOFRAN) 4 MG tablet Take 4 mg by mouth every 8 (eight) hours. 02/06/23   [provider]  ondansetron (ZOFRAN) 4 MG tablet Take 1 tablet (4 mg total) by mouth every 6 (six) hours. 02/24/23   Wynetta Fines, MD  ondansetron (ZOFRAN-ODT) 4 MG disintegrating tablet Take 1 tablet (4 mg total) by mouth every 8 (eight) hours as needed for nausea or vomiting. 01/11/23   Gerhard Munch, MD  oxyCODONE-acetaminophen (PERCOCET) 5-325 MG tablet Take 1 tablet by mouth every 6 (six) hours as needed for severe pain. 01/05/23   Rhyne, Ames Coupe, PA-C  pantoprazole (PROTONIX) 40 MG tablet TAKE ONE TABLET BY MOUTH DAILY AT 9AM 04/17/22   Grayce Sessions, NP  polyethylene glycol powder (GLYCOLAX/MIRALAX) 17 GM/SCOOP powder Take 17 g by mouth daily. Patient taking differently: Take 17 g by mouth daily as needed for mild constipation. 02/25/22   Burnadette Pop, MD  pregabalin (LYRICA) 100 MG capsule Take 1 capsule (100 mg total) by mouth 3 (three) times daily as needed (for pain). 07/05/22   Grayce Sessions, NP  promethazine (PHENERGAN) 12.5 MG tablet Take 1 tablet (12.5 mg total) by mouth every 6 (six) hours as needed for nausea or vomiting. 02/21/23   Gustavus Bryant, FNP  RESTASIS 0.05 % ophthalmic emulsion Place 1 drop into both eyes in the morning and at bedtime. 11/19/17   [provider]  rosuvastatin (CRESTOR) 10 MG tablet TAKE ONE TABLET BY MOUTH DAILY AT 5PM 12/05/22   Bensimhon, Bevelyn Buckles, MD  senna-docusate (SENOKOT-S) 8.6-50 MG tablet Take 1 tablet by mouth 2 (two) times daily. 02/24/22   Burnadette Pop, MD  torsemide (DEMADEX) 100 MG tablet Take 100 mg by mouth See admin instructions.  Take Sunday,Monday, Wednesday and Friday  04/02/22   [provider]    Physical Exam: Vitals:   03/14/23 2130 03/14/23 2145 03/14/23 2200 03/14/23 2215  BP: 103/68  98/70   Pulse: (!) 104 88  100 93  Resp: 20 20 (!) 22 15  Temp:      TempSrc:      SpO2: 99% 100% 99% 100%  Weight:      Height:        Constitutional: NAD, no pallor or diaphoresis   Eyes: PERTLA, lids and conjunctivae normal ENMT: Mucous membranes are moist. Posterior pharynx clear of any exudate or lesions.   Neck: supple, no masses  Respiratory: no wheezing, no crackles. No accessory muscle use.  Cardiovascular: S1 & S2 heard, regular rate and rhythm. Bilateral LE edema.  Abdomen: No distension, no tenderness, soft. Bowel sounds active.  Musculoskeletal: no clubbing / cyanosis. No joint deformity upper and lower extremities.   Skin: no significant rashes, lesions, ulcers. Warm, dry, well-perfused. Neurologic: CN 2-12 grossly intact. Sensation to light touch diminished throughout left side. Strength 5/5 in all 4 limbs. Alert and oriented.  Psychiatric: Calm. Cooperative.    Labs and Imaging on Admission: I have personally reviewed following labs and imaging studies  CBC: Recent Labs  Lab 03/14/23 1210  WBC 5.9  HGB 10.7*  HCT 34.3*  MCV 100.0  PLT 148*   Basic Metabolic Panel: Recent Labs  Lab 03/14/23 1210  NA 138  K 4.7  CL 96*  CO2 28  GLUCOSE 124*  BUN 27*  CREATININE 7.48*  CALCIUM 9.4   GFR: Estimated Creatinine Clearance: 10.1 mL/min (A) (by C-G formula based on SCr of 7.48 mg/dL (H)). Liver Function Tests: No results for input(s): "AST", "ALT", "ALKPHOS", "BILITOT", "PROT", "ALBUMIN" in the last 168 hours. No results for input(s): "LIPASE", "AMYLASE" in the last 168 hours. No results for input(s): "AMMONIA" in the last 168 hours. Coagulation Profile: No results for input(s): "INR", "PROTIME" in the last 168 hours. Cardiac Enzymes: No results for input(s): "CKTOTAL", "CKMB", "CKMBINDEX", "TROPONINI" in the last 168  hours. BNP (last 3 results) No results for input(s): "PROBNP" in the last 8760 hours. HbA1C: No results for input(s): "HGBA1C" in the last 72 hours. CBG: No results for input(s): "GLUCAP" in the last 168 hours. Lipid Profile: No results for input(s): "CHOL", "HDL", "LDLCALC", "TRIG", "CHOLHDL", "LDLDIRECT" in the last 72 hours. Thyroid Function Tests: No results for input(s): "TSH", "T4TOTAL", "FREET4", "T3FREE", "THYROIDAB" in the last 72 hours. Anemia Panel: No results for input(s): "VITAMINB12", "FOLATE", "FERRITIN", "TIBC", "IRON", "RETICCTPCT" in the last 72 hours. Urine analysis:    Component Value Date/Time   COLORURINE AMBER (A) 11/07/2022 1447   APPEARANCEUR CLOUDY (A) 11/07/2022 1447   LABSPEC 1.015 11/07/2022 1447   PHURINE 5.0 11/07/2022 1447   GLUCOSEU NEGATIVE 11/07/2022 1447   HGBUR SMALL (A) 11/07/2022 1447   BILIRUBINUR small (A) 02/21/2023 1748   KETONESUR trace (5) (A) 02/21/2023 1748   KETONESUR NEGATIVE 11/07/2022 1447   PROTEINUR =100 (A) 02/21/2023 1748   PROTEINUR 100 (A) 11/07/2022 1447   UROBILINOGEN 0.2 02/21/2023 1748   UROBILINOGEN 1.0 10/26/2014 0249   NITRITE Negative 02/21/2023 1748   NITRITE NEGATIVE 11/07/2022 1447   LEUKOCYTESUR Negative 02/21/2023 1748   LEUKOCYTESUR TRACE (A) 11/07/2022 1447   Sepsis Labs: @LABRCNTIP (procalcitonin:4,lacticidven:4) )No results found for this or any previous visit (from the past 240 hour(s)).   Radiological Exams on Admission: CT Head Wo Contrast  Result Date: 03/14/2023 CLINICAL DATA:  Headache, neuro deficit EXAM: CT HEAD WITHOUT CONTRAST TECHNIQUE: Contiguous axial images were obtained from the base of the skull through the vertex without intravenous contrast. RADIATION DOSE REDUCTION: This exam was performed according to the departmental dose-optimization  program which includes automated exposure control, adjustment of the mA and/or kV according to patient size and/or use of iterative reconstruction  technique. COMPARISON:  MRI and CT scan head from 06/28/2022. FINDINGS: Brain: No evidence of acute infarction, hemorrhage, hydrocephalus, extra-axial collection or mass lesion/mass effect. There is bilateral periventricular hypodensity, which is non-specific but most likely seen in the settings of microvascular ischemic changes. Mild in extent. Vascular: No hyperdense vessel or unexpected calcification. Skull: Normal. Negative for fracture or focal lesion. Note is made of hyperostosis frontalis interna. Sinuses/Orbits: No acute finding. Other: None. IMPRESSION: 1. No acute intracranial abnormality. 2. Mild microvascular ischemic changes. Electronically Signed   By: Jules Schick M.D.   On: 03/14/2023 12:20   DG Chest Portable 1 View  Result Date: 03/14/2023 CLINICAL DATA:  shob EXAM: PORTABLE CHEST 1 VIEW COMPARISON:  02/24/2023. FINDINGS: Probable left basilar atelectatic changes. Bilateral lateral costophrenic angles are clear. Central pulmonary vascular congestion, which may be accentuated by low lung volume. Stable enlarged cardio-mediastinal silhouette. Sternotomy wires noted. There is probable left atrial appendage closure device. Right IJ hemodialysis catheter noted with its tip overlying the cavoatrial junction region. No acute osseous abnormalities. The soft tissues are within normal limits. IMPRESSION: 1. Cardiomegaly and central pulmonary vascular congestion. 2. Left basilar atelectatic changes. Electronically Signed   By: Jules Schick M.D.   On: 03/14/2023 12:17    EKG: Independently reviewed. Atrial fibrillation.   Assessment/Plan   1. Vertigo; left-sided numbness  - No acute findings on head CT  - Check MRI brain, treat symptoms    2. Orthostatic hypotension  - She complains of lightheadedness on standing and SBP dropped 28 mmHg on standing - Weight is down from earlier this month but she does not appear particularly hypovolemic  - Hold torsemide, start compression stockings and  abdominal binder, repeat orthostatic vitals in am    3. ESRD - No indication for urgent dialysis on admission   - Restrict fluids, renally-dose medications    4. Atrial fibrillation  - Continue Eliquis, amiodarone, and metoprolol   5. COPD; OSA  - Not in exacerbation on admission  - Continue CPAP, bronchodilator    6. Type II DM  - A1c was 6.7% in April 2023  - Check CBGs and use low-intensity SSI     DVT prophylaxis: Eliquis  Code Status: Full   Level of Care: Level of care: Telemetry Cardiac Family Communication: None present  Disposition Plan:  Patient is from: home  Anticipated d/c is to: Home  Anticipated d/c date is: Possibly as early as 7/25 or 03/16/23  Patient currently: Pending MRI brain, stable vitals  Consults called: None  Admission status: Observation     Briscoe Deutscher, MD [facility]  03/14/2023, 11:14 PM

## 2023-03-14 NOTE — Progress Notes (Signed)
No-show for appointment  Hemodialysis-associated hypotension  Lightheadedness  Patient felt lightheaded today. Had dialysis yesterday.  Vitals:   03/14/23 1017 03/14/23 1020  BP: 98/64 100/66  Pulse: 95 (!) 57  SpO2: 98% 95%   Patient transported to ED via EMS. Will call to reschedule.

## 2023-03-14 NOTE — ED Provider Notes (Signed)
[place] [facility] AT [facility] Provider Note   CSN: [id number] Arrival date & time: 03/14/23  1055     History  Chief Complaint  Patient presents with   Dizziness    [name] is a 67 y.o. female with past medical history significant for diabetes, diastolic heart failure, A-fib, mitral regurgitation with previous mitral valve and tricuspid valve repair, COPD, ESRD on dialysis, morbid obesity who presents with concern for lightheadedness, dizziness when walking to podiatrist office today.  Patient reports that she had dialysis yesterday, was not feeling very well, had some leaking from her fistula.  She endorses some sensory deficit on the left side of her face, left arm and left leg.  She endorses some intermittent nausea but reports that this is her baseline.  She also endorses left-sided chest pain which feels like pressure that started last night.  She reports been constant since then.  She reports it is associated with shortness of breath.   Dizziness      Home Medications Prior to Admission medications   Medication Sig Start Date End Date Taking? Authorizing Provider  acetaminophen (TYLENOL) 325 MG tablet Take 2 tablets (650 mg total) by mouth every 6 (six) hours as needed for mild pain or moderate pain. 12/07/21   Arrien, York Ram, MD  albuterol (PROVENTIL HFA) 108 (90 Base) MCG/ACT inhaler Inhale 1-2 puffs into the lungs every 6 (six) hours as needed for wheezing or shortness of breath. 10/13/22   Grayce Sessions, NP  amiodarone (PACERONE) 200 MG tablet TAKE 1 TABLET BY MOUTH TWICE A DAY Patient taking differently: Take 200 mg by mouth daily. 04/25/22   Bensimhon, Bevelyn Buckles, MD  apixaban (ELIQUIS) 5 MG TABS tablet Take 1 tablet (5 mg total) by mouth 2 (two) times daily. 02/21/23   Bensimhon, Bevelyn Buckles, MD  Blood Glucose Monitoring Suppl (ACCU-CHEK GUIDE) w/Device KIT Use to check blood sugar TID. Dx: E11.69 Patient taking differently:  Use to check blood sugar TID. Dx: E11.69 Last reading: 105, 02-20-2023, Fasting. 06/28/22   Hoy Register, MD  busPIRone (BUSPAR) 5 MG tablet TAKE ONE TABLET BY MOUTH TWICE DAILY @ 9AM & 5PM 09/06/22   Grayce Sessions, NP  colchicine 0.6 MG tablet Take 1/2 (one-half) tablet by mouth once daily 11/23/22   Hoy Register, MD  diphenhydrAMINE (BENADRYL) 25 mg capsule Take 25 mg by mouth every 6 (six) hours as needed for allergies or itching.    [provider]  Dulaglutide (TRULICITY) 1.5 MG/0.5ML SOPN Inject 1.5 mg into the skin once a week. 07/05/22   Grayce Sessions, NP  DULoxetine (CYMBALTA) 60 MG capsule TAKE ONE CAPSULE (60MG  TOTAL) BY MOUTH DAILY AT 9AM 02/12/23   Grayce Sessions, NP  EPINEPHrine 0.3 mg/0.3 mL IJ SOAJ injection Inject 0.3 mg into the muscle as needed for anaphylaxis. 06/25/20   Grayce Sessions, NP  fluticasone (FLONASE) 50 MCG/ACT nasal spray Place 2 sprays into both nostrils daily as needed for allergies or rhinitis. 09/22/22   Grayce Sessions, NP  glucose blood (ACCU-CHEK GUIDE) test strip USE TO CHECK BLOOD SUGAR 3 TIMES DAILY 04/03/22   Hoy Register, MD  insulin lispro (HUMALOG KWIKPEN) 100 UNIT/ML KwikPen Inject 10 Units into the skin 3 (three) times daily. Patient taking differently: Inject 5-10 Units into the skin 3 (three) times daily. Sling scale 105= 1-2 units 106= 07/05/22   Grayce Sessions, NP  isosorbide mononitrate (IMDUR) 30 MG 24 hr tablet Take  0.5 tablets (15 mg total) by mouth daily. 03/18/22 12/29/22  Arrien, York Ram, MD  lidocaine-prilocaine (EMLA) cream Apply 1 Application topically Every Tuesday,Thursday,and Saturday with dialysis. 06/01/22   [provider]  linaclotide (LINZESS) 72 MCG capsule TAKE 1 CAPSULE (72 MCG TOTAL) BY MOUTH DAILY BEFORE BREAKFAST. IF BM NOT ACHIEVE WITH 1 CAPSULE SHE MAY TAKE 2 DAILY. STOP IF DIARRHEA OCCURS Patient taking differently: Take 72 mcg by mouth daily as needed  (Constipation).  STOP IF DIARRHEA OCCURS 07/05/22   Grayce Sessions, NP  loratadine (CLARITIN) 10 MG tablet TAKE ONE TABLET BY MOUTH DAILY AT 9AM 09/06/22   Grayce Sessions, NP  metoprolol succinate (TOPROL-XL) 25 MG 24 hr tablet TAKE 1 TABLET BY MOUTH IN THE MORNING AND AT BEDTIME 09/12/22   Lanier Prude, MD  midodrine (PROAMATINE) 10 MG tablet Take 10 mg by mouth Every Tuesday,Thursday,and Saturday with dialysis. 30 min before Dialysis Take 10 mg 2 hours after on dialysis 08/16/22   [provider]  Multiple Vitamins-Minerals (MULTIVITAMIN WOMEN PO) Take 1 tablet by mouth daily.    [provider]  multivitamin (RENA-VIT) TABS tablet Take 1 tablet by mouth daily. 04/27/22   [provider]  nitroGLYCERIN (NITROSTAT) 0.4 MG SL tablet Place 1 tablet (0.4 mg total) under the tongue every 5 (five) minutes x 3 doses as needed for chest pain. 01/08/14   Rinaldo Cloud, MD  ondansetron (ZOFRAN) 4 MG tablet Take 4 mg by mouth every 8 (eight) hours. 02/06/23   [provider]  ondansetron (ZOFRAN) 4 MG tablet Take 1 tablet (4 mg total) by mouth every 6 (six) hours. 02/24/23   Wynetta Fines, MD  ondansetron (ZOFRAN-ODT) 4 MG disintegrating tablet Take 1 tablet (4 mg total) by mouth every 8 (eight) hours as needed for nausea or vomiting. 01/11/23   Gerhard Munch, MD  oxyCODONE-acetaminophen (PERCOCET) 5-325 MG tablet Take 1 tablet by mouth every 6 (six) hours as needed for severe pain. 01/05/23   Rhyne, Ames Coupe, PA-C  pantoprazole (PROTONIX) 40 MG tablet TAKE ONE TABLET BY MOUTH DAILY AT 9AM 04/17/22   Grayce Sessions, NP  polyethylene glycol powder (GLYCOLAX/MIRALAX) 17 GM/SCOOP powder Take 17 g by mouth daily. Patient taking differently: Take 17 g by mouth daily as needed for mild constipation. 02/25/22   Burnadette Pop, MD  pregabalin (LYRICA) 100 MG capsule Take 1 capsule (100 mg total) by mouth 3 (three) times daily as needed (for pain). 07/05/22    Grayce Sessions, NP  promethazine (PHENERGAN) 12.5 MG tablet Take 1 tablet (12.5 mg total) by mouth every 6 (six) hours as needed for nausea or vomiting. 02/21/23   Gustavus Bryant, FNP  RESTASIS 0.05 % ophthalmic emulsion Place 1 drop into both eyes in the morning and at bedtime. 11/19/17   [provider]  rosuvastatin (CRESTOR) 10 MG tablet TAKE ONE TABLET BY MOUTH DAILY AT 5PM 12/05/22   Bensimhon, Bevelyn Buckles, MD  senna-docusate (SENOKOT-S) 8.6-50 MG tablet Take 1 tablet by mouth 2 (two) times daily. 02/24/22   Burnadette Pop, MD  torsemide (DEMADEX) 100 MG tablet Take 100 mg by mouth See admin instructions.  Take Sunday,Monday, Wednesday and Friday 04/02/22   [provider]      Allergies    Bee pollen, Bee venom, Sulfa antibiotics, and Iodinated contrast media    Review of Systems   Review of Systems  Neurological:  Positive for dizziness.  All other systems reviewed and are negative.  Physical Exam Updated Vital Signs BP 116/85   Pulse (!) 117   Temp (!) 97.4 F (36.3 C) (Oral)   Resp 20   Ht 5\' 7"  (1.702 m)   Wt 124.7 kg   SpO2 97%   BMI 43.07 kg/m  Physical Exam Vitals and nursing note reviewed.  Constitutional:      General: She is not in acute distress.    Appearance: Normal appearance. She is obese. She is ill-appearing.     Comments: Chronically ill appearing, non toxic, non septic appearing at this time  HENT:     Head: Normocephalic and atraumatic.  Eyes:     General:        Right eye: No discharge.        Left eye: No discharge.  Cardiovascular:     Rate and Rhythm: Normal rate. Rhythm irregular.     Heart sounds: No murmur heard.    No friction rub. No gallop.  Pulmonary:     Effort: Pulmonary effort is normal.     Breath sounds: Normal breath sounds.  Abdominal:     General: Bowel sounds are normal.     Palpations: Abdomen is soft.     Comments: No significant tenderness to palpation of the abdomen, she has been vomiting  intermittently throughout her ED stay  Skin:    General: Skin is warm and dry.     Capillary Refill: Capillary refill takes less than 2 seconds.  Neurological:     Mental Status: She is alert and oriented to person, place, and time.     Comments: Moves all 4 limbs spontaneously, CN II through XII grossly intact, can ambulate with some difficulty endorses feeling lightheaded but does not seem grossly ataxic  Psychiatric:        Mood and Affect: Mood normal.        Behavior: Behavior normal.     ED Results / Procedures / Treatments   Labs (all labs ordered are listed, but only abnormal results are displayed) Labs Reviewed  CBC - Abnormal; Notable for the following components:      Result Value   RBC 3.43 (*)    Hemoglobin 10.7 (*)    HCT 34.3 (*)    Platelets 148 (*)    All other components within normal limits  BASIC METABOLIC PANEL - Abnormal; Notable for the following components:   Chloride 96 (*)    Glucose, Bld 124 (*)    BUN 27 (*)    Creatinine, Ser 7.48 (*)    GFR, Estimated 6 (*)    All other components within normal limits  BRAIN NATRIURETIC PEPTIDE - Abnormal; Notable for the following components:   B Natriuretic Peptide 152.5 (*)    All other components within normal limits  TROPONIN I (HIGH SENSITIVITY)  TROPONIN I (HIGH SENSITIVITY)    EKG EKG Interpretation Date/Time:  Wednesday March 14 2023 12:13:56 EDT Ventricular Rate:  97 PR Interval:    QRS Duration:  107 QT Interval:  405 QTC Calculation: 515 R Axis:   149  Text Interpretation: Atrial fibrillation Probable right ventricular hypertrophy Inferior infarct, old Prolonged QT interval No significant change since last tracing Confirmed by Margarita Grizzle [phone]) on 03/14/2023 2:46:22 PM  Radiology CT Head Wo Contrast  Result Date: 03/14/2023 CLINICAL DATA:  Headache, neuro deficit EXAM: CT HEAD WITHOUT CONTRAST TECHNIQUE: Contiguous axial images were obtained from the base of the skull through the vertex  without intravenous contrast. RADIATION DOSE REDUCTION: This exam  was performed according to the departmental dose-optimization program which includes automated exposure control, adjustment of the mA and/or kV according to patient size and/or use of iterative reconstruction technique. COMPARISON:  MRI and CT scan head from 06/28/2022. FINDINGS: Brain: No evidence of acute infarction, hemorrhage, hydrocephalus, extra-axial collection or mass lesion/mass effect. There is bilateral periventricular hypodensity, which is non-specific but most likely seen in the settings of microvascular ischemic changes. Mild in extent. Vascular: No hyperdense vessel or unexpected calcification. Skull: Normal. Negative for fracture or focal lesion. Note is made of hyperostosis frontalis interna. Sinuses/Orbits: No acute finding. Other: None. IMPRESSION: 1. No acute intracranial abnormality. 2. Mild microvascular ischemic changes. Electronically Signed   By: Jules Schick M.D.   On: 03/14/2023 12:20   DG Chest Portable 1 View  Result Date: 03/14/2023 CLINICAL DATA:  shob EXAM: PORTABLE CHEST 1 VIEW COMPARISON:  02/24/2023. FINDINGS: Probable left basilar atelectatic changes. Bilateral lateral costophrenic angles are clear. Central pulmonary vascular congestion, which may be accentuated by low lung volume. Stable enlarged cardio-mediastinal silhouette. Sternotomy wires noted. There is probable left atrial appendage closure device. Right IJ hemodialysis catheter noted with its tip overlying the cavoatrial junction region. No acute osseous abnormalities. The soft tissues are within normal limits. IMPRESSION: 1. Cardiomegaly and central pulmonary vascular congestion. 2. Left basilar atelectatic changes. Electronically Signed   By: Jules Schick M.D.   On: 03/14/2023 12:17    Procedures Procedures    Medications Ordered in ED Medications  alum & mag hydroxide-simeth (MAALOX/MYLANTA) 200-200-20 MG/5ML suspension 30 mL (has no  administration in time range)    And  lidocaine (XYLOCAINE) 2 % viscous mouth solution 15 mL (has no administration in time range)  HYDROmorphone (DILAUDID) injection 1 mg (1 mg Intravenous Given 03/14/23 1245)  acetaminophen (TYLENOL) tablet 650 mg (650 mg Oral Given 03/14/23 1245)  ondansetron (ZOFRAN) injection 4 mg (4 mg Intravenous Given 03/14/23 1245)  diphenhydrAMINE (BENADRYL) injection 25 mg (25 mg Intravenous Given 03/14/23 1324)  ondansetron (ZOFRAN) injection 4 mg (4 mg Intravenous Given 03/14/23 1458)  midazolam (VERSED) injection 2 mg (2 mg Intravenous Given 03/14/23 1458)    ED Course/ Medical Decision Making/ A&P Clinical Course as of 03/14/23 1525  Wed Mar 14, 2023  1130 Left podiatry appt this afternoon. Was feeling dizzy, nauseous, having chest pain at the time 9/10. Constant in nature. Associated with shob. Some tingling in left sided extremities.  [CP]  1447 Ongoing nausea, vomiting, chest pain, light headedness. Getting delta troponin.  [CP]    Clinical Course User Index [CP] Olene Floss, PA-C                             Medical Decision Making Amount and/or Complexity of Data Reviewed Labs: ordered. Radiology: ordered.  Risk OTC drugs. Prescription drug management.   This patient is a 67 y.o. female who presents to the ED for concern of weakness, dizziness, chest pain, arm tingling, this involves an extensive number of treatment options, and is a complaint that carries with it a high risk of complications and morbidity. The emergent differential diagnosis prior to evaluation includes, but is not limited to,  CVA, spinal cord injury, ACS, arrhythmia, syncope, orthostatic hypotension, sepsis, hypoglycemia, hypoxia, electrolyte disturbance, endocrine disorder, anemia, environmental exposure, polypharmacy, patient also having some nausea, vomiting, considered her chronic gastroparesis, gastritis, acute gastroenteritis, less clinical suspicion for acute  mesenteric ischemia, appendicitis, diverticulitis, or other acute or surgical abnormality in  the abdomen. This is not an exhaustive differential.   Past Medical History / Co-morbidities / Social History: diabetes, diastolic heart failure, A-fib, mitral regurgitation with previous mitral valve and tricuspid valve repair, COPD, ESRD on dialysis, morbid obesity  Additional history: Chart reviewed. Pertinent results include: Reviewed lab work, imaging from previous emergency department visits, notably patient with frequent visits for intractable nausea, vomiting secondary to gastroparesis  Physical Exam: Physical exam performed. The pertinent findings include: She is obese.     Comments: Chronically ill appearing, non toxic, non septic appearing at this time  No significant tenderness to palpation of the abdomen, she has been vomiting intermittently throughout her ED stay Moves all 4 limbs spontaneously, CN II through XII grossly intact, can ambulate with some difficulty endorses feeling lightheaded but does not seem grossly ataxic  Lab Tests: I ordered, and personally interpreted labs.  The pertinent results include: BMP with BUN/creatinine elevated, she has ESRD, her electrolytes are within normal range however.  CBC with chronic anemia, hemoglobin 10.7, slightly decreased from her baseline.  BNP is not significantly elevated at 152.5.  Initial troponin at 7.  She will be pending delta troponin.   Imaging Studies: I ordered imaging studies including CT head without contrast, plain film chest x-ray. I independently visualized and interpreted imaging which showed no evidence of intrathoracic or intracranial abnormality to explain patient's symptoms at this time. I agree with the radiologist interpretation.   Cardiac Monitoring:  The patient was maintained on a cardiac monitor.  My attending physician Dr. Rosalia Hammers viewed and interpreted the cardiac monitored which showed an underlying rhythm of: aflutter.  I agree with this interpretation.   Medications: I ordered medication including Versed, Zofran, Benadryl, Dilaudid, Tylenol for pain, nausea, vomiting  3:25 PM Care of [name] transferred to [facility] and Dr. Rubin Payor at the end of my shift as the patient will require reassessment once labs/imaging have resulted. Patient presentation, ED course, and plan of care discussed with review of all pertinent labs and imaging. Please see his/her note for further details regarding further ED course and disposition. Plan at time of handoff is do not have significant ongoing suspicion for acute neurologic abnormality at this time but she continues to have persistent nausea and vomiting, we will see how she responds to Versed, and reassess, if she is able to ambulate, tolerate p.o then I think that she is ultimately stable for discharge. This may be altered or completely changed at the discretion of the oncoming team pending results of further workup.   Final Clinical Impression(s) / ED Diagnoses Final diagnoses:  None    Rx / DC Orders ED Discharge Orders     None         [place] 03/14/23 1525    Margarita Grizzle, MD 03/15/23 [phone]

## 2023-03-14 NOTE — ED Triage Notes (Signed)
Per [facility] pt coming from doctors office c/o light headed and dizziness onset of when walking into office. Had dialysis yesterday. T/TH/Sat dialysis treatment. C/o left sided headache onset of today.

## 2023-03-14 NOTE — ED Notes (Addendum)
O2 sats remained at 100% to counter, then SATs dropped to 91% and pt complained of feeling dizzy on the walk back. Gait was stable until drop. Pt able to return to room safely with assistance. Pt connected to monitor and O2 sats went back up to 100%, and pt requested ginger ale and more ice. Still complaining of nausea. Nurse notified.

## 2023-03-14 NOTE — ED Provider Notes (Cosign Needed Addendum)
   Accepted handoff at shift change from [facility]. Please see prior provider note for more detail.   Briefly: Patient is 67 y.o. "lightheadedness, dizziness when walking to podiatrist office today. Patient reports that she had dialysis yesterday, was not feeling very well, had some leaking from her fistula. She endorses some sensory deficit on the left side of her face, left arm and left leg. She endorses some intermittent nausea but reports that this is her baseline. She also endorses left-sided chest pain which feels like pressure that started last night. She reports been constant since then. She reports it is associated with shortness of breath."  DDX: concern for " CVA, spinal cord injury, ACS, arrhythmia, syncope, orthostatic hypotension, sepsis, hypoglycemia, hypoxia, electrolyte disturbance, endocrine disorder, anemia, environmental exposure, polypharmacy, patient also having some nausea, vomiting, considered her chronic gastroparesis, gastritis, acute gastroenteritis, less clinical suspicion for acute mesenteric ischemia, appendicitis, diverticulitis, or other acute or surgical abnormality in the abdomen"   Plan:  - Admitting patient for Afib with RVR, shortness of breath (91% O2 with ambulation), and dizziness - PMHx diabetes, diastolic heart failure, A-fib on Eliquis, mitral regurgitation with previous mitral valve and tricuspid valve repair, COPD, ESRD on dialysis  - presents to ED concerned for SOB, lightheadedness and dizziness while at podiatry office today. Also endorsing tingling of left side face/arm/leg and chest pain since last night. - neuro exam unremarkable. Last HR in 120's. Patient is afebrile and BP is appropriate. Ambulated patient on RA with drop to 91% and severe dizziness. - Delta trop reassuring. BMP with elevated BUN/Cr - patient had dialysis yesterday. CBC with mild anemia (10.7) and no leukocytosis. CT head negative. Chest xray showing central pulmonary  vascular congestion. - gave patient meds for pain, nausea, and vomiting - Discussed with Dr. Antionette Char who requested patient be given metoprolol before considering hospital admission. - Patient given IV metoprolol without resolution in symptoms. Orthostatic vitals concerning. Reached out to Dr. Antionette Char who agreed to see patient.           Dorthy Cooler, [place] 03/14/23 2245    [address], [place] 03/14/23 2249    Margarita Grizzle, MD 03/15/23 [phone]

## 2023-03-14 NOTE — ED Notes (Signed)
Pt asked for a cold rag to be put across her forehead and neck.

## 2023-03-14 NOTE — ED Notes (Signed)
Tried to abulate pt, upon standing she stated she felt dizzy, took bp 134/109 o2 98 RA, appeared unsteady and was swaying back and forth. Waited another 3 mins, stand at side of patient for precautions, retook her bp 86/52 o2 98 RA

## 2023-03-15 ENCOUNTER — Telehealth: Payer: Self-pay | Admitting: Podiatry

## 2023-03-15 ENCOUNTER — Observation Stay (HOSPITAL_COMMUNITY): Payer: 59

## 2023-03-15 DIAGNOSIS — I340 Nonrheumatic mitral (valve) insufficiency: Secondary | ICD-10-CM | POA: Diagnosis present

## 2023-03-15 DIAGNOSIS — I480 Paroxysmal atrial fibrillation: Secondary | ICD-10-CM | POA: Diagnosis present

## 2023-03-15 DIAGNOSIS — E1122 Type 2 diabetes mellitus with diabetic chronic kidney disease: Secondary | ICD-10-CM | POA: Diagnosis present

## 2023-03-15 DIAGNOSIS — I132 Hypertensive heart and chronic kidney disease with heart failure and with stage 5 chronic kidney disease, or end stage renal disease: Secondary | ICD-10-CM | POA: Diagnosis present

## 2023-03-15 DIAGNOSIS — F32A Depression, unspecified: Secondary | ICD-10-CM | POA: Diagnosis present

## 2023-03-15 DIAGNOSIS — F419 Anxiety disorder, unspecified: Secondary | ICD-10-CM | POA: Diagnosis present

## 2023-03-15 DIAGNOSIS — I951 Orthostatic hypotension: Secondary | ICD-10-CM | POA: Diagnosis present

## 2023-03-15 DIAGNOSIS — J4489 Other specified chronic obstructive pulmonary disease: Secondary | ICD-10-CM | POA: Diagnosis present

## 2023-03-15 DIAGNOSIS — Z1152 Encounter for screening for COVID-19: Secondary | ICD-10-CM | POA: Diagnosis not present

## 2023-03-15 DIAGNOSIS — E785 Hyperlipidemia, unspecified: Secondary | ICD-10-CM | POA: Diagnosis present

## 2023-03-15 DIAGNOSIS — R42 Dizziness and giddiness: Secondary | ICD-10-CM | POA: Diagnosis not present

## 2023-03-15 DIAGNOSIS — Z992 Dependence on renal dialysis: Secondary | ICD-10-CM | POA: Diagnosis not present

## 2023-03-15 DIAGNOSIS — D631 Anemia in chronic kidney disease: Secondary | ICD-10-CM | POA: Diagnosis present

## 2023-03-15 DIAGNOSIS — N2581 Secondary hyperparathyroidism of renal origin: Secondary | ICD-10-CM | POA: Diagnosis present

## 2023-03-15 DIAGNOSIS — Z794 Long term (current) use of insulin: Secondary | ICD-10-CM | POA: Diagnosis not present

## 2023-03-15 DIAGNOSIS — Z7901 Long term (current) use of anticoagulants: Secondary | ICD-10-CM | POA: Diagnosis not present

## 2023-03-15 DIAGNOSIS — E1143 Type 2 diabetes mellitus with diabetic autonomic (poly)neuropathy: Secondary | ICD-10-CM | POA: Diagnosis present

## 2023-03-15 DIAGNOSIS — N186 End stage renal disease: Secondary | ICD-10-CM | POA: Diagnosis present

## 2023-03-15 DIAGNOSIS — K3184 Gastroparesis: Secondary | ICD-10-CM | POA: Diagnosis present

## 2023-03-15 DIAGNOSIS — Z6841 Body Mass Index (BMI) 40.0 and over, adult: Secondary | ICD-10-CM | POA: Diagnosis not present

## 2023-03-15 DIAGNOSIS — I5022 Chronic systolic (congestive) heart failure: Secondary | ICD-10-CM | POA: Diagnosis present

## 2023-03-15 DIAGNOSIS — E1169 Type 2 diabetes mellitus with other specified complication: Secondary | ICD-10-CM | POA: Diagnosis present

## 2023-03-15 DIAGNOSIS — G4733 Obstructive sleep apnea (adult) (pediatric): Secondary | ICD-10-CM | POA: Diagnosis present

## 2023-03-15 LAB — CBG MONITORING, ED
Glucose-Capillary: 108 mg/dL — ABNORMAL HIGH (ref 70–99)
Glucose-Capillary: 110 mg/dL — ABNORMAL HIGH (ref 70–99)
Glucose-Capillary: 167 mg/dL — ABNORMAL HIGH (ref 70–99)

## 2023-03-15 LAB — BASIC METABOLIC PANEL
Anion gap: 15 (ref 5–15)
BUN: 35 mg/dL — ABNORMAL HIGH (ref 8–23)
CO2: 26 mmol/L (ref 22–32)
Calcium: 9.4 mg/dL (ref 8.9–10.3)
Chloride: 91 mmol/L — ABNORMAL LOW (ref 98–111)
Creatinine, Ser: 8.64 mg/dL — ABNORMAL HIGH (ref 0.44–1.00)
GFR, Estimated: 5 mL/min — ABNORMAL LOW (ref >60)
Potassium: 4.3 mmol/L (ref 3.5–5.1)
Sodium: 132 mmol/L — ABNORMAL LOW (ref 135–145)

## 2023-03-15 LAB — CBC
Hemoglobin: 10.8 g/dL — ABNORMAL LOW (ref 12.0–15.0)
MCH: 31.6 pg (ref 26.0–34.0)
MCHC: 31.9 g/dL (ref 30.0–36.0)
MCV: 99.1 fL (ref 80.0–100.0)
Platelets: 146 10*3/uL — ABNORMAL LOW (ref 150–400)
RDW: 15.2 % (ref 11.5–15.5)
WBC: 6.9 10*3/uL (ref 4.0–10.5)
nRBC: 0 % (ref 0.0–0.2)

## 2023-03-15 LAB — HEMOGLOBIN A1C
Hgb A1c MFr Bld: 6 % — ABNORMAL HIGH (ref 4.8–5.6)
Mean Plasma Glucose: 125.5 mg/dL

## 2023-03-15 LAB — HEPATITIS B SURFACE ANTIGEN: Hepatitis B Surface Ag: NONREACTIVE

## 2023-03-15 LAB — HIV ANTIBODY (ROUTINE TESTING W REFLEX): HIV Screen 4th Generation wRfx: NONREACTIVE

## 2023-03-15 LAB — GLUCOSE, CAPILLARY: Glucose-Capillary: 120 mg/dL — ABNORMAL HIGH (ref 70–99)

## 2023-03-15 MED ORDER — PROCHLORPERAZINE EDISYLATE 10 MG/2ML IJ SOLN
5.0000 mg | Freq: Once | INTRAMUSCULAR | Status: AC | PRN
  Administered 2023-03-15: 5 mg via INTRAVENOUS
  Filled 2023-03-15: qty 2

## 2023-03-15 MED ORDER — HEPARIN SODIUM (PORCINE) 1000 UNIT/ML IJ SOLN
INTRAMUSCULAR | Status: AC
  Filled 2023-03-15: qty 3

## 2023-03-15 MED ORDER — CHLORHEXIDINE GLUCONATE CLOTH 2 % EX PADS
6.0000 | MEDICATED_PAD | Freq: Every day | CUTANEOUS | Status: DC
  Administered 2023-03-16 – 2023-03-17 (×2): 6 via TOPICAL

## 2023-03-15 MED ORDER — OXYCODONE-ACETAMINOPHEN 5-325 MG PO TABS
1.0000 | ORAL_TABLET | Freq: Once | ORAL | Status: AC | PRN
  Administered 2023-03-15: 1 via ORAL
  Filled 2023-03-15: qty 1

## 2023-03-15 MED ORDER — HEPARIN SODIUM (PORCINE) 1000 UNIT/ML IJ SOLN
INTRAMUSCULAR | Status: AC
  Administered 2023-03-15: 3800 [IU]
  Filled 2023-03-15: qty 1

## 2023-03-15 MED ORDER — ALBUMIN HUMAN 25 % IV SOLN
25.0000 g | Freq: Once | INTRAVENOUS | Status: AC
  Administered 2023-03-15: 25 g via INTRAVENOUS

## 2023-03-15 MED ORDER — OXYCODONE HCL 5 MG PO TABS
5.0000 mg | ORAL_TABLET | Freq: Once | ORAL | Status: AC
  Administered 2023-03-15: 5 mg via ORAL
  Filled 2023-03-15: qty 1

## 2023-03-15 MED ORDER — MIDODRINE HCL 5 MG PO TABS
10.0000 mg | ORAL_TABLET | Freq: Once | ORAL | Status: AC
  Administered 2023-03-15: 10 mg via ORAL
  Filled 2023-03-15: qty 2

## 2023-03-15 MED ORDER — ALBUMIN HUMAN 25 % IV SOLN
INTRAVENOUS | Status: AC
  Filled 2023-03-15: qty 100

## 2023-03-15 MED ORDER — COLCHICINE 0.3 MG HALF TABLET
0.3000 mg | ORAL_TABLET | Freq: Every day | ORAL | Status: DC
  Administered 2023-03-16 – 2023-03-18 (×3): 0.3 mg via ORAL
  Filled 2023-03-15 (×3): qty 1

## 2023-03-15 MED ORDER — HEPARIN SODIUM (PORCINE) 1000 UNIT/ML DIALYSIS
2500.0000 [IU] | Freq: Once | INTRAMUSCULAR | Status: AC
  Administered 2023-03-15: 2500 [IU] via INTRAVENOUS_CENTRAL
  Filled 2023-03-15: qty 3

## 2023-03-15 MED ORDER — HYDROMORPHONE HCL 1 MG/ML IJ SOLN: 0.5000 mg | Freq: Once | INTRAMUSCULAR | Status: DC

## 2023-03-15 NOTE — ED Notes (Signed)
CBG 108. 

## 2023-03-15 NOTE — Progress Notes (Signed)
PROGRESS NOTE    [name]  WGN:[id number] DOB: May 08, 1956 DOA: 03/14/2023 PCP: Grayce Sessions, NP   Brief Narrative:  HPI: [name] is a 67 y.o. female with medical history significant for type 2 diabetes mellitus, hypertension, COPD, chronic HFpEF, OSA on CPAP, history of DVT, atrial fibrillation on Eliquis, and ESRD on hemodialysis who presents to the emergency department with lightheadedness, dizziness, and left-sided numbness.   Patient began to notice left-sided numbness, room spinning sensation, and worsening lightheadedness upon standing 2 or 3 days ago.  She has been feeling as though she might pass out whenever she stands up but has also had intermittent room spinning sensation, even while sitting or lying down.  Additionally, patient reports numbness involving the left face, left arm, and left leg.  She reports completing dialysis yesterday but had some bleeding from her fistula afterwards and had acute worsening in her lightheadedness with treatment.   ED Course: Upon arrival to the ED, patient is found to be afebrile and saturating upper 90s on room air with labile heart rate and blood pressure.  EKG demonstrates atrial fibrillation.  Chest x-ray notable for cardiomegaly and pulmonary vascular congestion.  No acute findings noted on head CT.  Labs are most notable for normal potassium, normal bicarbonate, BUN 27, hemoglobin 10.7, normal troponin x 2, and BNP 153.   Patient was treated with GI cocktail, Benadryl, Dilaudid, Versed, Zofran, and Lopressor in the ED.  Assessment & Plan:   Principal Problem:   Orthostatic hypotension Active Problems:   AF (paroxysmal atrial fibrillation) (HCC)   COPD (chronic obstructive pulmonary disease) (HCC)   Type 2 diabetes mellitus with hyperlipidemia (HCC)   QT prolongation   ESRD on dialysis (HCC)   OSA on CPAP   Left sided numbness  Vertigo; left-sided numbness/dizziness/orthostatic hypotension: -Positive  orthostatics upon arrival.  Patient history of ESRD on dialysis.  Left-sided numbness still persist but MRI brain ruled out stroke.  No focal weakness.  Patient appears to be extremely anxious.  Wondering if anxiety is playing a role.  We will check her orthostatic blood pressure every 4 hours next couple of hours.  Observe overnight since patient has not ready for discharge.   ESRD on HD, TTS schedule. -She was post to go at 6 AM today for dialysis but now she is here so I have consulted nephrology for dialysis.   Atrial fibrillation  -Rate is controlled.  Continue Eliquis, amiodarone, and metoprolol    COPD; OSA  - Not in exacerbation on admission  - Continue CPAP, bronchodilator     Type II DM  - A1c was 6.7% in April 2023  - Check CBGs and use low-intensity SSI    DVT prophylaxis: Eliquis   Code Status: Full Code  Family Communication:  None present at bedside.  Plan of care discussed with patient in length and he/she verbalized understanding and agreed with it.  Status is: Observation The patient will require care spanning > 2 midnights and should be moved to inpatient because: Patient is still symptomatic.   Estimated body mass index is 43.07 kg/m as calculated from the following:   Height as of this encounter: 5\' 7"  (1.702 m).   Weight as of this encounter: 124.7 kg.    Nutritional Assessment: Body mass index is 43.07 kg/m.Marland Kitchen Seen by dietician.  I agree with the assessment and plan as outlined below: Nutrition Status:        . Skin Assessment: I have examined  the patient's skin and I agree with the wound assessment as performed by the wound care RN as outlined below:    Consultants:  Nephrology  Procedures:  None  Antimicrobials:  Anti-infectives (From admission, onward)    None         Subjective: Patient seen and examined.  She is still in the ED.  She is still complains of dizziness and left-sided numbness with no improvement compared to yesterday.   She is extremely anxious.  Objective: Vitals:   03/15/23 0515 03/15/23 0600 03/15/23 0645 03/15/23 0813  BP: 103/70 127/77 (!) 146/55   Pulse: 79 86 94   Resp: 14 16 19    Temp:  98.1 F (36.7 C)  98 F (36.7 C)  TempSrc:  Oral    SpO2: 99% 99% 99%   Weight:      Height:       No intake or output data in the 24 hours ending 03/15/23 0829 Filed Weights   03/14/23 1103  Weight: 124.7 kg    Examination:  General exam: Appears very anxious. Respiratory system: Clear to auscultation. Respiratory effort normal. Cardiovascular system: S1 & S2 heard, RRR. No JVD, murmurs, rubs, gallops or clicks. No pedal edema. Gastrointestinal system: Abdomen is nondistended, soft and nontender. No organomegaly or masses felt. Normal bowel sounds heard. Central nervous system: Alert and oriented. No focal neurological deficits. Extremities: Symmetric 5 x 5 power. Skin: No rashes, lesions or ulcers Psychiatry: Judgement and insight appear normal. Mood & affect appropriate.    Data Reviewed: I have personally reviewed following labs and imaging studies  CBC: Recent Labs  Lab 03/14/23 1210 03/15/23 0045  WBC 5.9 6.9  HGB 10.7* 10.8*  HCT 34.3* 33.9*  MCV 100.0 99.1  PLT 148* 146*   Basic Metabolic Panel: Recent Labs  Lab 03/14/23 1210 03/15/23 0045  NA 138 132*  K 4.7 4.3  CL 96* 91*  CO2 28 26  GLUCOSE 124* 99  BUN 27* 35*  CREATININE 7.48* 8.64*  CALCIUM 9.4 9.4   GFR: Estimated Creatinine Clearance: 8.8 mL/min (A) (by C-G formula based on SCr of 8.64 mg/dL (H)). Liver Function Tests: No results for input(s): "AST", "ALT", "ALKPHOS", "BILITOT", "PROT", "ALBUMIN" in the last 168 hours. No results for input(s): "LIPASE", "AMYLASE" in the last 168 hours. No results for input(s): "AMMONIA" in the last 168 hours. Coagulation Profile: No results for input(s): "INR", "PROTIME" in the last 168 hours. Cardiac Enzymes: No results for input(s): "CKTOTAL", "CKMB", "CKMBINDEX",  "TROPONINI" in the last 168 hours. BNP (last 3 results) No results for input(s): "PROBNP" in the last 8760 hours. HbA1C: Recent Labs    03/15/23 0045  HGBA1C 6.0*   CBG: Recent Labs  Lab 03/15/23 0000 03/15/23 0804  GLUCAP 110* 108*   Lipid Profile: No results for input(s): "CHOL", "HDL", "LDLCALC", "TRIG", "CHOLHDL", "LDLDIRECT" in the last 72 hours. Thyroid Function Tests: No results for input(s): "TSH", "T4TOTAL", "FREET4", "T3FREE", "THYROIDAB" in the last 72 hours. Anemia Panel: No results for input(s): "VITAMINB12", "FOLATE", "FERRITIN", "TIBC", "IRON", "RETICCTPCT" in the last 72 hours. Sepsis Labs: No results for input(s): "PROCALCITON", "LATICACIDVEN" in the last 168 hours.  No results found for this or any previous visit (from the past 240 hour(s)).   Radiology Studies: MR BRAIN WO CONTRAST  Result Date: 03/15/2023 CLINICAL DATA:  Initial evaluation for neuro deficit, stroke suspected. EXAM: MRI HEAD WITHOUT CONTRAST TECHNIQUE: Multiplanar, multiecho pulse sequences of the brain and surrounding structures were obtained without intravenous contrast. COMPARISON:  Prior CT from 03/14/2023. FINDINGS: Brain: Examination mildly degraded by motion artifact. Cerebral volume within normal limits. Scattered patchy T2/FLAIR hyperintensity involving the periventricular deep white matter both cerebral hemispheres, most like related chronic microvascular ischemic disease, mild for age. No evidence for acute or subacute ischemia. Gray-white matter differentiation maintained. No areas of chronic cortical infarction. No acute intracranial hemorrhage. Few punctate chronic micro hemorrhages noted within the right cerebral hemisphere with a likely small vessel related. No mass lesion, midline shift or mass effect. No hydrocephalus or extra-axial fluid collection. Pituitary gland suprasellar region within normal limits. Vascular: Major intracranial vascular flow voids are maintained. Skull and  upper cervical spine: Cranial junction with normal limits. Decreased T1 signal intensity seen within the bone marrow the visualized upper spine, nonspecific, but most commonly related to anemia, smoking or obesity. No scalp soft tissue abnormality. Sinuses/Orbits: Prior bilateral ocular lens replacement. Mild bilateral exophthalmos. Paranasal sinuses are clear. Trace bilateral mastoid effusions, of doubtful significance. Visualized nasopharynx unremarkable. Other: None. IMPRESSION: 1. No acute intracranial abnormality. 2. Mild chronic microvascular ischemic disease for age. Electronically Signed   By: Rise Mu M.D.   On: 03/15/2023 02:00   CT Head Wo Contrast  Result Date: 03/14/2023 CLINICAL DATA:  Headache, neuro deficit EXAM: CT HEAD WITHOUT CONTRAST TECHNIQUE: Contiguous axial images were obtained from the base of the skull through the vertex without intravenous contrast. RADIATION DOSE REDUCTION: This exam was performed according to the departmental dose-optimization program which includes automated exposure control, adjustment of the mA and/or kV according to patient size and/or use of iterative reconstruction technique. COMPARISON:  MRI and CT scan head from 06/28/2022. FINDINGS: Brain: No evidence of acute infarction, hemorrhage, hydrocephalus, extra-axial collection or mass lesion/mass effect. There is bilateral periventricular hypodensity, which is non-specific but most likely seen in the settings of microvascular ischemic changes. Mild in extent. Vascular: No hyperdense vessel or unexpected calcification. Skull: Normal. Negative for fracture or focal lesion. Note is made of hyperostosis frontalis interna. Sinuses/Orbits: No acute finding. Other: None. IMPRESSION: 1. No acute intracranial abnormality. 2. Mild microvascular ischemic changes. Electronically Signed   By: Jules Schick M.D.   On: 03/14/2023 12:20   DG Chest Portable 1 View  Result Date: 03/14/2023 CLINICAL DATA:  shob  EXAM: PORTABLE CHEST 1 VIEW COMPARISON:  02/24/2023. FINDINGS: Probable left basilar atelectatic changes. Bilateral lateral costophrenic angles are clear. Central pulmonary vascular congestion, which may be accentuated by low lung volume. Stable enlarged cardio-mediastinal silhouette. Sternotomy wires noted. There is probable left atrial appendage closure device. Right IJ hemodialysis catheter noted with its tip overlying the cavoatrial junction region. No acute osseous abnormalities. The soft tissues are within normal limits. IMPRESSION: 1. Cardiomegaly and central pulmonary vascular congestion. 2. Left basilar atelectatic changes. Electronically Signed   By: Jules Schick M.D.   On: 03/14/2023 12:17    Scheduled Meds:  amiodarone  200 mg Oral Daily   apixaban  5 mg Oral BID   busPIRone  5 mg Oral BID   colchicine  0.6 mg Oral Daily   DULoxetine  60 mg Oral Daily   insulin aspart  0-5 Units Subcutaneous QHS   insulin aspart  0-6 Units Subcutaneous TID WC   metoprolol succinate  25 mg Oral Daily   pantoprazole  40 mg Oral Daily   rosuvastatin  10 mg Oral Daily   sodium chloride flush  3 mL Intravenous Q12H   Continuous Infusions:   LOS: 0 days   Hughie Closs, MD [facility]  03/15/2023, 8:29 AM   *Please note that this is a verbal dictation therefore any spelling or grammatical errors are due to the "[employer] One" system interpretation.  Please page via Amion and do not message via secure chat for urgent patient care matters. Secure chat can be used for non urgent patient care matters.  How to contact the [facility] Attending or Consulting provider 7A - 7P or covering provider during after hours 7P -7A, for this patient?  Check the care team in [facility] and look for a) attending/consulting [facility] provider listed and b) the [facility] team listed. Page or secure chat 7A-7P. Log into [email or web address] and use [place]'s universal password to access. If you do not have the password, please contact  the hospital operator. Locate the [facility] provider you are looking for under [facility] and page to a number that you can be directly reached. If you still have difficulty reaching the provider, please page the [facility] (Director on Call) for the Hospitalists listed on amion for assistance.

## 2023-03-15 NOTE — Progress Notes (Addendum)
Accessed the AVF,which located on inner lateral upper arm,venous site have a hard time to accessed initially but other wise venous and arterial accessed flushed out and in smoothly.Initiated treatment ,it went well for ten minutes  ,until she moved /folded her left arm across her chest,venous access infiltrated.stopped treatment,accessed the venous Hd catheter and re-assessed the AVF arterial accessed,aspiration went well but when power flush given,patient felt pain .HD nurses accessed arterial limb for continuation of her HD treatment.ice pack placed on swelled area of AVF venous site.Will monitor.

## 2023-03-15 NOTE — Progress Notes (Signed)
Received patient in bed to unit.  Alert and oriented.  Informed consent signed and in chart.   TX duration:  Patient tolerated well.  Transported back to the room  Alert, without acute distress.  Hand-off given to patient's nurse.   Access used: L fistula infiltrated, moved access to R internal jugular cath for treatment Access issues: L fistula infiltrated, moved access to R internal jugular cath for treatment  Total UF removed: 2700 mL Medication(s) given: Tylenol and Percocet for head ache   03/15/23 1838  Vitals  BP 109/61  MAP (mmHg) 76  BP Location Right Wrist  BP Method Automatic  Patient Position (if appropriate) Lying  Pulse Rate (!) 43  Pulse Rate Source Monitor  ECG Heart Rate 89  Resp (!) 29  Oxygen Therapy  SpO2 93 %  O2 Device Nasal Cannula  O2 Flow Rate (L/min) 1 L/min     Stacie Glaze LPN [facility]

## 2023-03-15 NOTE — ED Notes (Signed)
ED TO INPATIENT HANDOFF REPORT  ED Nurse Name and Phone #:  Alexandar Weisenberger  S Name/Age/Gender [name] 67 y.o. female Room/Bed: 046C/046C  Code Status   Code Status: Full Code  Home/SNF/Other Home Patient oriented to: self, place, time, and situation Is this baseline? Yes   Triage Complete: Triage complete  Chief Complaint Orthostatic hypotension [I95.1]  Triage Note Per [facility] pt coming from doctors office c/o light headed and dizziness onset of when walking into office. Had dialysis yesterday. T/TH/Sat dialysis treatment. C/o left sided headache onset of today.    Allergies Allergies  Allergen Reactions   Bee Pollen Anaphylaxis and Other (See Comments)    UNCONFIRMED, but IS allergic to bee VENOM   Bee Venom Anaphylaxis   Sulfa Antibiotics Itching   Iodinated Contrast Media Nausea And Vomiting    Level of Care/Admitting Diagnosis ED Disposition     ED Disposition  Admit   Condition  --   Comment  Hospital Area: [facility] [[id number]]  Level of Care: Med-Surg [16]  May admit patient to [facility] or [facility] if equivalent level of care is available:: No  Covid Evaluation: Asymptomatic - no recent exposure (last 10 days) testing not required  Diagnosis: Orthostatic hypotension [458.0.ICD-9-CM]  Admitting Physician: Hughie Closs [[id number]]  Attending Physician: Hughie Closs [[id number]]  Certification:: I certify this patient will need inpatient services for at least 2 midnights  Estimated Length of Stay: 2          B Medical/Surgery History Past Medical History:  Diagnosis Date   Acute on chronic systolic CHF (congestive heart failure) (HCC) 12/21/2020   Allergic rhinitis    Anemia    Arthritis    Asthma    Chronic diastolic CHF (congestive heart failure) (HCC)    Chronic kidney disease    Dialysis Tu/TH/Sa   COPD (chronic obstructive pulmonary disease) (HCC)    Depression    DM (diabetes mellitus) (HCC)    DVT (deep vein  thrombosis) in pregnancy    GERD (gastroesophageal reflux disease)    HTN (hypertension)    Hyperlipidemia    Obesity    Pneumonia 04/2018   RIGHT LOBE   Sleep apnea    needs to be retested - to get new cpap   Past Surgical History:  Procedure Laterality Date   ABDOMINAL HYSTERECTOMY     AV FISTULA PLACEMENT Left 03/17/2022   Procedure: LEFT ARM ARTERIOVENOUS (AV) FISTULA CREATION;  Surgeon: Chuck Hint, MD;  Location[facility] OR;  Service: Vascular;  Laterality: Left;   BIOPSY  01/12/2023   Procedure: BIOPSY;  Surgeon: Jeani Hawking, MD;  Location: [facility] ENDOSCOPY;  Service: Gastroenterology;;   Thressa Sheller STUDY  11/26/2020   Procedure: BUBBLE STUDY;  Surgeon: Jodelle Red, MD;  Location: [facility] ENDOSCOPY;  Service: Cardiovascular;;   CARDIOVERSION N/A 05/06/2020   Procedure: CARDIOVERSION;  Surgeon: Jodelle Red, MD;  Location: [facility] ENDOSCOPY;  Service: Cardiovascular;  Laterality: N/A;   CARDIOVERSION N/A 11/04/2020   Procedure: CARDIOVERSION;  Surgeon: Lewayne Bunting, MD;  Location: [facility] ENDOSCOPY;  Service: Cardiovascular;  Laterality: N/A;   CARDIOVERSION N/A 02/01/2021   Procedure: CARDIOVERSION;  Surgeon: Dolores Patty, MD;  Location: [facility] ENDOSCOPY;  Service: Cardiovascular;  Laterality: N/A;   CARDIOVERSION N/A 02/09/2021   Procedure: CARDIOVERSION;  Surgeon: Dolores Patty, MD;  Location: [facility] ENDOSCOPY;  Service: Cardiovascular;  Laterality: N/A;   CARDIOVERSION N/A 04/19/2021   Procedure: CARDIOVERSION;  Surgeon: Pricilla Riffle, MD;  Location: [facility] ENDOSCOPY;  Service: Cardiovascular;  Laterality: N/A;   CARDIOVERSION N/A 11/25/2021   Procedure: CARDIOVERSION;  Surgeon: Dolores Patty, MD;  Location: [facility] ENDOSCOPY;  Service: Cardiovascular;  Laterality: N/A;   CARDIOVERSION N/A 01/31/2022   Procedure: CARDIOVERSION;  Surgeon: Dolores Patty, MD;  Location: [facility] ENDOSCOPY;  Service: Cardiovascular;  Laterality: N/A;   CATARACT EXTRACTION, BILATERAL      CHOLECYSTECTOMY     COLONOSCOPY WITH PROPOFOL N/A 03/05/2015   Procedure: COLONOSCOPY WITH PROPOFOL;  Surgeon: Jeani Hawking, MD;  Location: [facility] ENDOSCOPY;  Service: Endoscopy;  Laterality: N/A;   DIAGNOSTIC LAPAROSCOPY     ESOPHAGOGASTRODUODENOSCOPY (EGD) WITH PROPOFOL N/A 01/12/2023   Procedure: ESOPHAGOGASTRODUODENOSCOPY (EGD) WITH PROPOFOL;  Surgeon: Jeani Hawking, MD;  Location: [facility] ENDOSCOPY;  Service: Gastroenterology;  Laterality: N/A;   FISTULA SUPERFICIALIZATION Left 03/17/2022   Procedure: FISTULA SUPERFICIALIZATION -LEFT;  Surgeon: Chuck Hint, MD;  Location: [facility] OR;  Service: Vascular;  Laterality: Left;   ingrown hallux Left    IR FLUORO GUIDE CV LINE RIGHT  03/14/2022   IR US GUIDE VASC ACCESS RIGHT  03/14/2022   KNEE SURGERY     LEFT HEART CATH AND CORONARY ANGIOGRAPHY N/A 12/31/2017   Procedure: LEFT HEART CATH AND CORONARY ANGIOGRAPHY;  Surgeon: Rinaldo Cloud, MD;  Location: [facility] INVASIVE CV LAB;  Service: Cardiovascular;  Laterality: N/A;   MAZE N/A 12/29/2020   Procedure: MAZE;  Surgeon: Loreli Slot, MD;  Location: [facility] OR;  Service: Open Heart Surgery;  Laterality: N/A;   MITRAL VALVE REPAIR N/A 12/29/2020   Procedure: MITRAL VALVE REPAIR USING CARBOMEDICS ANNULOFLEX RING SIZE 30;  Surgeon: Loreli Slot, MD;  Location: [facility] OR;  Service: Open Heart Surgery;  Laterality: N/A;   REVISON OF ARTERIOVENOUS FISTULA Left 01/05/2023   Procedure: REVISION OF LEFT ARTERIOVENOUS FISTULA WITH INTERPOSITION PTFE GRAFT;  Surgeon: Chuck Hint, MD;  Location: [facility] OR;  Service: Vascular;  Laterality: Left;  ELIQUIS AND HOLD 48 X HOURS   RIGHT HEART CATH N/A 12/22/2020   Procedure: RIGHT HEART CATH;  Surgeon: Laurey Morale, MD;  Location: [facility] INVASIVE CV LAB;  Service: Cardiovascular;  Laterality: N/A;   RIGHT HEART CATH N/A 01/31/2021   Procedure: RIGHT HEART CATH;  Surgeon: Dolores Patty, MD;  Location: [facility] INVASIVE CV LAB;  Service: Cardiovascular;  Laterality:  N/A;   RIGHT HEART CATH N/A 07/29/2021   Procedure: RIGHT HEART CATH;  Surgeon: Dolores Patty, MD;  Location: [facility] INVASIVE CV LAB;  Service: Cardiovascular;  Laterality: N/A;   RIGHT HEART CATH N/A 11/24/2021   Procedure: RIGHT HEART CATH;  Surgeon: Dolores Patty, MD;  Location: [facility] INVASIVE CV LAB;  Service: Cardiovascular;  Laterality: N/A;   RIGHT HEART CATH N/A 02/14/2022   Procedure: RIGHT HEART CATH;  Surgeon: Dolores Patty, MD;  Location: [facility] INVASIVE CV LAB;  Service: Cardiovascular;  Laterality: N/A;   RIGHT HEART CATH N/A 02/22/2022   Procedure: RIGHT HEART CATH;  Surgeon: Laurey Morale, MD;  Location: [facility] INVASIVE CV LAB;  Service: Cardiovascular;  Laterality: N/A;   TEE WITHOUT CARDIOVERSION N/A 11/26/2020   Procedure: TRANSESOPHAGEAL ECHOCARDIOGRAM (TEE);  Surgeon: Jodelle Red, MD;  Location: [facility] ENDOSCOPY;  Service: Cardiovascular;  Laterality: N/A;   TEE WITHOUT CARDIOVERSION N/A 12/29/2020   Procedure: TRANSESOPHAGEAL ECHOCARDIOGRAM (TEE);  Surgeon: Loreli Slot, MD;  Location: [facility] OR;  Service: Open Heart Surgery;  Laterality: N/A;   TEE WITHOUT CARDIOVERSION N/A 01/20/2022   Procedure: TRANSESOPHAGEAL ECHOCARDIOGRAM (TEE);  Surgeon: Dolores Patty, MD;  Location: [facility] ENDOSCOPY;  Service: Cardiovascular;  Laterality: N/A;   TRICUSPID VALVE REPLACEMENT N/A 12/29/2020   Procedure: 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 RING SIZE 34;  Surgeon: Loreli Slot, MD;  Location: [facility] OR;  Service: Open Heart Surgery;  Laterality: N/A;     A IV Location/Drains/Wounds Patient Lines/Drains/Airways Status     Active Line/Drains/Airways     Name Placement date Placement time Site Days   Peripheral IV 03/14/23 20 G Right Antecubital 03/14/23  1212  Antecubital  1   Fistula / Graft Left Upper arm Arteriovenous fistula 03/17/22  0812  Upper arm  363   Hemodialysis Catheter Right Subclavian Double lumen Permanent (Tunneled) 03/14/22  0843   Subclavian  366            Intake/Output Last 24 hours No intake or output data in the 24 hours ending 03/15/23 1158  Labs/Imaging Results for orders placed or performed during the hospital encounter of 03/14/23 (from the past 48 hour(s))  CBC     Status: Abnormal   Collection Time: 03/14/23 12:10 PM  Result Value Ref Range   WBC 5.9 4.0 - 10.5 K/uL   RBC 3.43 (L) 3.87 - 5.11 MIL/uL   Hemoglobin 10.7 (L) 12.0 - 15.0 g/dL   HCT 81.1 (L) 91.4 - 78.2 %   MCV 100.0 80.0 - 100.0 fL   MCH 31.2 26.0 - 34.0 pg   MCHC 31.2 30.0 - 36.0 g/dL   RDW 95.6 21.3 - 08.6 %   Platelets 148 (L) 150 - 400 K/uL   nRBC 0.0 0.0 - 0.2 %    Comment: Performed at [facility] Lab, [address], [place], [place] [address]  Basic metabolic panel     Status: Abnormal   Collection Time: 03/14/23 12:10 PM  Result Value Ref Range   Sodium 138 135 - 145 mmol/L   Potassium 4.7 3.5 - 5.1 mmol/L   Chloride 96 (L) 98 - 111 mmol/L   CO2 28 22 - 32 mmol/L   Glucose, Bld 124 (H) 70 - 99 mg/dL    Comment: Glucose reference range applies only to samples taken after fasting for at least 8 hours.   BUN 27 (H) 8 - 23 mg/dL   Creatinine, Ser 9.62 (H) 0.44 - 1.00 mg/dL   Calcium 9.4 8.9 - 95.2 mg/dL   GFR, Estimated 6 (L) >60 mL/min    Comment: (NOTE) Calculated using the CKD-EPI Creatinine Equation (2021)    Anion gap 14 5 - 15    Comment: Performed at [facility], [address], [place], [place] [address]  Brain natriuretic peptide     Status: Abnormal   Collection Time: 03/14/23 12:10 PM  Result Value Ref Range   B Natriuretic Peptide 152.5 (H) 0.0 - 100.0 pg/mL    Comment: Performed at [facility], [address] [place], [place] [address]  Troponin I (High Sensitivity)     Status: None   Collection Time: 03/14/23 12:10 PM  Result Value Ref Range   Troponin I (High Sensitivity) 7 <18 ng/L    Comment: (NOTE) Elevated high sensitivity troponin I (hsTnI) values and significant  changes  across serial measurements may suggest ACS but many other  chronic and acute conditions are known to elevate hsTnI results.  Refer to the "Links" section for chest pain algorithms and additional  guidance. Performed at [facility], [address] [place], [place] [address]   Troponin I (  High Sensitivity)     Status: None   Collection Time: 03/14/23  4:18 PM  Result Value Ref Range   Troponin I (High Sensitivity) 8 <18 ng/L    Comment: (NOTE) Elevated high sensitivity troponin I (hsTnI) values and significant  changes across serial measurements may suggest ACS but many other  chronic and acute conditions are known to elevate hsTnI results.  Refer to the "Links" section for chest pain algorithms and additional  guidance. Performed at [facility], [address] [place], [place] [address]   CBG monitoring, ED     Status: Abnormal   Collection Time: 03/15/23 12:00 AM  Result Value Ref Range   Glucose-Capillary 110 (H) 70 - 99 mg/dL    Comment: Glucose reference range applies only to samples taken after fasting for at least 8 hours.  Hemoglobin A1c     Status: Abnormal   Collection Time: 03/15/23 12:45 AM  Result Value Ref Range   Hgb A1c MFr Bld 6.0 (H) 4.8 - 5.6 %    Comment: (NOTE) Pre diabetes:          5.7%-6.4%  Diabetes:              >6.4%  Glycemic control for   <7.0% adults with diabetes    Mean Plasma Glucose 125.5 mg/dL    Comment: Performed at [facility], [address] [place], [place] [address]  HIV Antibody (routine testing w rflx)     Status: None   Collection Time: 03/15/23 12:45 AM  Result Value Ref Range   HIV Screen 4th Generation wRfx Non Reactive Non Reactive    Comment: Performed at [facility], [address], [place], [place] [address]  Basic metabolic panel     Status: Abnormal   Collection Time: 03/15/23 12:45 AM  Result Value Ref Range   Sodium 132 (L) 135 - 145 mmol/L   Potassium 4.3 3.5 - 5.1 mmol/L   Chloride  91 (L) 98 - 111 mmol/L   CO2 26 22 - 32 mmol/L   Glucose, Bld 99 70 - 99 mg/dL    Comment: Glucose reference range applies only to samples taken after fasting for at least 8 hours.   BUN 35 (H) 8 - 23 mg/dL   Creatinine, Ser 6.21 (H) 0.44 - 1.00 mg/dL   Calcium 9.4 8.9 - 30.8 mg/dL   GFR, Estimated 5 (L) >60 mL/min    Comment: (NOTE) Calculated using the CKD-EPI Creatinine Equation (2021)    Anion gap 15 5 - 15    Comment: Performed at [facility], [address], [place], [place] [address]  CBC     Status: Abnormal   Collection Time: 03/15/23 12:45 AM  Result Value Ref Range   WBC 6.9 4.0 - 10.5 K/uL   RBC 3.42 (L) 3.87 - 5.11 MIL/uL   Hemoglobin 10.8 (L) 12.0 - 15.0 g/dL   HCT 69.6 (L) 29.5 - 28.4 %   MCV 99.1 80.0 - 100.0 fL   MCH 31.6 26.0 - 34.0 pg   MCHC 31.9 30.0 - 36.0 g/dL   RDW 13.2 44.0 - 10.2 %   Platelets 146 (L) 150 - 400 K/uL   nRBC 0.0 0.0 - 0.2 %    Comment: Performed at [facility], [address], [place], [place] [address]  CBG monitoring, ED     Status: Abnormal   Collection Time: 03/15/23  8:04 AM  Result Value Ref Range  Glucose-Capillary 108 (H) 70 - 99 mg/dL    Comment: Glucose reference range applies only to samples taken after fasting for at least 8 hours.   Comment 1 Notify RN    Comment 2 Document in Chart    *Note: Due to a large number of results and/or encounters for the requested time period, some results have not been displayed. A complete set of results can be found in Results Review.   MR BRAIN WO CONTRAST  Result Date: 03/15/2023 CLINICAL DATA:  Initial evaluation for neuro deficit, stroke suspected. EXAM: MRI HEAD WITHOUT CONTRAST TECHNIQUE: Multiplanar, multiecho pulse sequences of the brain and surrounding structures were obtained without intravenous contrast. COMPARISON:  Prior CT from 03/14/2023. FINDINGS: Brain: Examination mildly degraded by motion artifact. Cerebral volume within normal limits. Scattered patchy  T2/FLAIR hyperintensity involving the periventricular deep white matter both cerebral hemispheres, most like related chronic microvascular ischemic disease, mild for age. No evidence for acute or subacute ischemia. Gray-white matter differentiation maintained. No areas of chronic cortical infarction. No acute intracranial hemorrhage. Few punctate chronic micro hemorrhages noted within the right cerebral hemisphere with a likely small vessel related. No mass lesion, midline shift or mass effect. No hydrocephalus or extra-axial fluid collection. Pituitary gland suprasellar region within normal limits. Vascular: Major intracranial vascular flow voids are maintained. Skull and upper cervical spine: Cranial junction with normal limits. Decreased T1 signal intensity seen within the bone marrow the visualized upper spine, nonspecific, but most commonly related to anemia, smoking or obesity. No scalp soft tissue abnormality. Sinuses/Orbits: Prior bilateral ocular lens replacement. Mild bilateral exophthalmos. Paranasal sinuses are clear. Trace bilateral mastoid effusions, of doubtful significance. Visualized nasopharynx unremarkable. Other: None. IMPRESSION: 1. No acute intracranial abnormality. 2. Mild chronic microvascular ischemic disease for age. Electronically Signed   By: Rise Mu M.D.   On: 03/15/2023 02:00   CT Head Wo Contrast  Result Date: 03/14/2023 CLINICAL DATA:  Headache, neuro deficit EXAM: CT HEAD WITHOUT CONTRAST TECHNIQUE: Contiguous axial images were obtained from the base of the skull through the vertex without intravenous contrast. RADIATION DOSE REDUCTION: This exam was performed according to the departmental dose-optimization program which includes automated exposure control, adjustment of the mA and/or kV according to patient size and/or use of iterative reconstruction technique. COMPARISON:  MRI and CT scan head from 06/28/2022. FINDINGS: Brain: No evidence of acute infarction,  hemorrhage, hydrocephalus, extra-axial collection or mass lesion/mass effect. There is bilateral periventricular hypodensity, which is non-specific but most likely seen in the settings of microvascular ischemic changes. Mild in extent. Vascular: No hyperdense vessel or unexpected calcification. Skull: Normal. Negative for fracture or focal lesion. Note is made of hyperostosis frontalis interna. Sinuses/Orbits: No acute finding. Other: None. IMPRESSION: 1. No acute intracranial abnormality. 2. Mild microvascular ischemic changes. Electronically Signed   By: Jules Schick M.D.   On: 03/14/2023 12:20   DG Chest Portable 1 View  Result Date: 03/14/2023 CLINICAL DATA:  shob EXAM: PORTABLE CHEST 1 VIEW COMPARISON:  02/24/2023. FINDINGS: Probable left basilar atelectatic changes. Bilateral lateral costophrenic angles are clear. Central pulmonary vascular congestion, which may be accentuated by low lung volume. Stable enlarged cardio-mediastinal silhouette. Sternotomy wires noted. There is probable left atrial appendage closure device. Right IJ hemodialysis catheter noted with its tip overlying the cavoatrial junction region. No acute osseous abnormalities. The soft tissues are within normal limits. IMPRESSION: 1. Cardiomegaly and central pulmonary vascular congestion. 2. Left basilar atelectatic changes. Electronically Signed   By: Timoteo Expose.D.  On: 03/14/2023 12:17    Pending Labs Unresulted Labs (From admission, onward)     Start     Ordered   03/15/23 1139  Hepatitis B surface antigen  (New Admission Hemo Labs (Hepatitis B))  Once,   R        03/15/23 1140   03/15/23 1139  Hepatitis B surface antibody,quantitative  (New Admission Hemo Labs (Hepatitis B))  Once,   R        03/15/23 1140   03/15/23 0500  Basic metabolic panel  Daily,   R      03/14/23 2314   03/15/23 0500  CBC  Daily,   R      03/14/23 2314            Vitals/Pain Today's Vitals   03/15/23 0915 03/15/23 0930 03/15/23  0945 03/15/23 1100  BP:   (!) 151/132 108/72  Pulse:      Resp: (!) 21 20 18 16   Temp:      TempSrc:      SpO2:      Weight:      Height:      PainSc:        Isolation Precautions No active isolations  Medications Medications  colchicine tablet 0.6 mg (has no administration in time range)  amiodarone (PACERONE) tablet 200 mg (200 mg Oral Given 03/15/23 1111)  metoprolol succinate (TOPROL-XL) 24 hr tablet 25 mg (25 mg Oral Given 03/15/23 1110)  rosuvastatin (CRESTOR) tablet 10 mg (10 mg Oral Given 03/15/23 1107)  busPIRone (BUSPAR) tablet 5 mg (5 mg Oral Given 03/15/23 1107)  DULoxetine (CYMBALTA) DR capsule 60 mg (60 mg Oral Given 03/15/23 1110)  pantoprazole (PROTONIX) EC tablet 40 mg (40 mg Oral Given 03/15/23 1110)  apixaban (ELIQUIS) tablet 5 mg (5 mg Oral Given 03/15/23 1107)  insulin aspart (novoLOG) injection 0-6 Units ( Subcutaneous Not Given 03/15/23 0813)  insulin aspart (novoLOG) injection 0-5 Units (0 Units Subcutaneous Not Given 03/15/23 0023)  sodium chloride flush (NS) 0.9 % injection 3 mL (3 mLs Intravenous Given 03/15/23 1118)  acetaminophen (TYLENOL) tablet 650 mg (has no administration in time range)    Or  acetaminophen (TYLENOL) suppository 650 mg (has no administration in time range)  HYDROmorphone (DILAUDID) injection 0.5 mg (0.5 mg Intravenous Given 03/14/23 2358)  meclizine (ANTIVERT) tablet 25 mg (has no administration in time range)  Chlorhexidine Gluconate Cloth 2 % PADS 6 each (has no administration in time range)  HYDROmorphone (DILAUDID) injection 1 mg (1 mg Intravenous Given 03/14/23 1245)  acetaminophen (TYLENOL) tablet 650 mg (650 mg Oral Given 03/14/23 1245)  ondansetron (ZOFRAN) injection 4 mg (4 mg Intravenous Given 03/14/23 1245)  diphenhydrAMINE (BENADRYL) injection 25 mg (25 mg Intravenous Given 03/14/23 1324)  ondansetron (ZOFRAN) injection 4 mg (4 mg Intravenous Given 03/14/23 1458)  alum & mag hydroxide-simeth (MAALOX/MYLANTA) 200-200-20 MG/5ML  suspension 30 mL (30 mLs Oral Given 03/14/23 1614)    And  lidocaine (XYLOCAINE) 2 % viscous mouth solution 15 mL (15 mLs Oral Given 03/14/23 1614)  midazolam (VERSED) injection 2 mg (2 mg Intravenous Given 03/14/23 1458)  metoprolol tartrate (LOPRESSOR) injection 5 mg (5 mg Intravenous Given 03/14/23 2143)  midazolam (VERSED) injection 2 mg (2 mg Intravenous Given 03/15/23 0052)  prochlorperazine (COMPAZINE) injection 5 mg (5 mg Intravenous Given 03/15/23 0050)    Mobility walks     Focused Assessments Cardiac Assessment Handoff:  Cardiac Rhythm: Atrial fibrillation Lab Results  Component Value Date   CKTOTAL 51 11/22/2021  CKMB 1.1 02/27/2012   TROPONINI <0.03 07/01/2018   Lab Results  Component Value Date   DDIMER 0.40 12/23/2022   Does the Patient currently have chest pain? No    R Recommendations: See Admitting Provider Note  Report given to:   Additional Notes:

## 2023-03-15 NOTE — Progress Notes (Signed)
Pt receives out-pt HD at [facility] [place] on TTS. Will assist as needed.   Olivia Canter Renal Navigator [phone]

## 2023-03-15 NOTE — Progress Notes (Signed)
New Admission Note:    Arrival Method: ED stretcher Mental Orientation: a/ox4 Telemetry: box 12 Assessment:completed Skin: intact IV: Right AC Pain: N/A Tubes: N/A Safety Measures: non skid socks, bed alarm Admission:  completed Orientation: Patient has been oriented to the room, unit and staff.  Family: N/A Belongings: kept at bedside  Orders have been reviewed and implemented. Will continue to monitor the patient. Call light has been placed within reach and bed alarm has been activated.   Fabian Sharp BSN, RN-BC Phone number: [phone]

## 2023-03-15 NOTE — Consult Note (Signed)
Renal Service Consult Note [facility]  [name] 03/15/2023 [name], MD Requesting Physician: Dr. [name]  Reason for Consult: ESRD pt w/ stroke-like symptoms HPI: The patient is a 67 y.o. year-old w/ PMH as below who presented to ED yesterday w/ c/o dizziness and lightheadedness. Had dialysis on Tuesday. Also c/o L sided numbness. Worse w/ standing up, not w/ lying down. In ED afeb, wnl O2 sats, HR 65-90, BP 108- 127/ 67- 82, RR 16-20.  EKG showed afib, CXR showed vasc congestion. Head CT neg, labs wnl for esrd status. Pt rec'd GI cocktail, benadryl, dilaudid, versed, zofran and lopressor in the ED. Orthostatics were done last night (+) and again this am (negative). Pt was admitted for vertigo and L sided numbness. MRI brain ordered. Torsemide       ROS - denies CP, no joint pain, no HA, no blurry vision, no rash, no diarrhea, no nausea/ vomiting, no dysuria, no difficulty voiding   Past Medical History  Past Medical History:  Diagnosis Date   Acute on chronic systolic CHF (congestive heart failure) (HCC) 12/21/2020   Allergic rhinitis    Anemia    Arthritis    Asthma    Chronic diastolic CHF (congestive heart failure) (HCC)    Chronic kidney disease    Dialysis Tu/TH/Sa   COPD (chronic obstructive pulmonary disease) (HCC)    Depression    DM (diabetes mellitus) (HCC)    DVT (deep vein thrombosis) in pregnancy    GERD (gastroesophageal reflux disease)    HTN (hypertension)    Hyperlipidemia    Obesity    Pneumonia 04/2018   RIGHT LOBE   Sleep apnea    needs to be retested - to get new cpap   Past Surgical History  Past Surgical History:  Procedure Laterality Date   ABDOMINAL HYSTERECTOMY     AV FISTULA PLACEMENT Left 03/17/2022   Procedure: LEFT ARM ARTERIOVENOUS (AV) FISTULA CREATION;  Surgeon: Chuck Hint, MD;  Location: [facility] OR;  Service: Vascular;  Laterality: Left;   BIOPSY  01/12/2023   Procedure: BIOPSY;  Surgeon:  Jeani Hawking, MD;  Location: [facility] ENDOSCOPY;  Service: Gastroenterology;;   Thressa Sheller STUDY  11/26/2020   Procedure: BUBBLE STUDY;  Surgeon: Jodelle Red, MD;  Location: [facility] ENDOSCOPY;  Service: Cardiovascular;;   CARDIOVERSION N/A 05/06/2020   Procedure: CARDIOVERSION;  Surgeon: Jodelle Red, MD;  Location: [facility] ENDOSCOPY;  Service: Cardiovascular;  Laterality: N/A;   CARDIOVERSION N/A 11/04/2020   Procedure: CARDIOVERSION;  Surgeon: Lewayne Bunting, MD;  Location: [facility] ENDOSCOPY;  Service: Cardiovascular;  Laterality: N/A;   CARDIOVERSION N/A 02/01/2021   Procedure: CARDIOVERSION;  Surgeon: Dolores Patty, MD;  Location: [facility] ENDOSCOPY;  Service: Cardiovascular;  Laterality: N/A;   CARDIOVERSION N/A 02/09/2021   Procedure: CARDIOVERSION;  Surgeon: Dolores Patty, MD;  Location: [facility] ENDOSCOPY;  Service: Cardiovascular;  Laterality: N/A;   CARDIOVERSION N/A 04/19/2021   Procedure: CARDIOVERSION;  Surgeon: Pricilla Riffle, MD;  Location: [facility] ENDOSCOPY;  Service: Cardiovascular;  Laterality: N/A;   CARDIOVERSION N/A 11/25/2021   Procedure: CARDIOVERSION;  Surgeon: Dolores Patty, MD;  Location: [facility] ENDOSCOPY;  Service: Cardiovascular;  Laterality: N/A;   CARDIOVERSION N/A 01/31/2022   Procedure: CARDIOVERSION;  Surgeon: Dolores Patty, MD;  Location: [facility] ENDOSCOPY;  Service: Cardiovascular;  Laterality: N/A;   CATARACT EXTRACTION, BILATERAL     CHOLECYSTECTOMY     COLONOSCOPY WITH PROPOFOL N/A 03/05/2015   Procedure: COLONOSCOPY WITH PROPOFOL;  Surgeon: Jeani Hawking,  MD;  Location: [facility] ENDOSCOPY;  Service: Endoscopy;  Laterality: N/A;   DIAGNOSTIC LAPAROSCOPY     ESOPHAGOGASTRODUODENOSCOPY (EGD) WITH PROPOFOL N/A 01/12/2023   Procedure: ESOPHAGOGASTRODUODENOSCOPY (EGD) WITH PROPOFOL;  Surgeon: Jeani Hawking, MD;  Location: [facility] ENDOSCOPY;  Service: Gastroenterology;  Laterality: N/A;   FISTULA SUPERFICIALIZATION Left 03/17/2022   Procedure: FISTULA SUPERFICIALIZATION -LEFT;  Surgeon:  Chuck Hint, MD;  Location: [facility] OR;  Service: Vascular;  Laterality: Left;   ingrown hallux Left    IR FLUORO GUIDE CV LINE RIGHT  03/14/2022   IR US GUIDE VASC ACCESS RIGHT  03/14/2022   KNEE SURGERY     LEFT HEART CATH AND CORONARY ANGIOGRAPHY N/A 12/31/2017   Procedure: LEFT HEART CATH AND CORONARY ANGIOGRAPHY;  Surgeon: Rinaldo Cloud, MD;  Location: [facility] INVASIVE CV LAB;  Service: Cardiovascular;  Laterality: N/A;   MAZE N/A 12/29/2020   Procedure: MAZE;  Surgeon: Loreli Slot, MD;  Location: [facility] OR;  Service: Open Heart Surgery;  Laterality: N/A;   MITRAL VALVE REPAIR N/A 12/29/2020   Procedure: MITRAL VALVE REPAIR USING CARBOMEDICS ANNULOFLEX RING SIZE 30;  Surgeon: Loreli Slot, MD;  Location: [facility] OR;  Service: Open Heart Surgery;  Laterality: N/A;   REVISON OF ARTERIOVENOUS FISTULA Left 01/05/2023   Procedure: REVISION OF LEFT ARTERIOVENOUS FISTULA WITH INTERPOSITION PTFE GRAFT;  Surgeon: Chuck Hint, MD;  Location: [facility] OR;  Service: Vascular;  Laterality: Left;  ELIQUIS AND HOLD 48 X HOURS   RIGHT HEART CATH N/A 12/22/2020   Procedure: RIGHT HEART CATH;  Surgeon: Laurey Morale, MD;  Location: [facility] INVASIVE CV LAB;  Service: Cardiovascular;  Laterality: N/A;   RIGHT HEART CATH N/A 01/31/2021   Procedure: RIGHT HEART CATH;  Surgeon: Dolores Patty, MD;  Location: [facility] INVASIVE CV LAB;  Service: Cardiovascular;  Laterality: N/A;   RIGHT HEART CATH N/A 07/29/2021   Procedure: RIGHT HEART CATH;  Surgeon: Dolores Patty, MD;  Location: [facility] INVASIVE CV LAB;  Service: Cardiovascular;  Laterality: N/A;   RIGHT HEART CATH N/A 11/24/2021   Procedure: RIGHT HEART CATH;  Surgeon: Dolores Patty, MD;  Location: [facility] INVASIVE CV LAB;  Service: Cardiovascular;  Laterality: N/A;   RIGHT HEART CATH N/A 02/14/2022   Procedure: RIGHT HEART CATH;  Surgeon: Dolores Patty, MD;  Location: [facility] INVASIVE CV LAB;  Service: Cardiovascular;  Laterality: N/A;   RIGHT HEART CATH  N/A 02/22/2022   Procedure: RIGHT HEART CATH;  Surgeon: Laurey Morale, MD;  Location: [facility] INVASIVE CV LAB;  Service: Cardiovascular;  Laterality: N/A;   TEE WITHOUT CARDIOVERSION N/A 11/26/2020   Procedure: TRANSESOPHAGEAL ECHOCARDIOGRAM (TEE);  Surgeon: Jodelle Red, MD;  Location: [facility] ENDOSCOPY;  Service: Cardiovascular;  Laterality: N/A;   TEE WITHOUT CARDIOVERSION N/A 12/29/2020   Procedure: TRANSESOPHAGEAL ECHOCARDIOGRAM (TEE);  Surgeon: Loreli Slot, MD;  Location: [facility] OR;  Service: Open Heart Surgery;  Laterality: N/A;   TEE WITHOUT CARDIOVERSION N/A 01/20/2022   Procedure: TRANSESOPHAGEAL ECHOCARDIOGRAM (TEE);  Surgeon: Dolores Patty, MD;  Location: [facility] ENDOSCOPY;  Service: Cardiovascular;  Laterality: N/A;   TRICUSPID VALVE REPLACEMENT N/A 12/29/2020   Procedure: 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 RING SIZE 34;  Surgeon: Loreli Slot, MD;  Location: [facility] OR;  Service: Open Heart Surgery;  Laterality: N/A;   Family History  Family History  Problem Relation Age of Onset   Breast cancer Mother    Cancer Mother    Hypertension Mother    Cancer Father  Hypertension Sister    Hypertension Brother    CAD Other    Social History  reports that she has never smoked. She has never been exposed to tobacco smoke. She has never used smokeless tobacco. She reports that she does not drink alcohol and does not use drugs. Allergies  Allergies  Allergen Reactions   Bee Pollen Anaphylaxis and Other (See Comments)    UNCONFIRMED, but IS allergic to bee VENOM   Bee Venom Anaphylaxis   Sulfa Antibiotics Itching   Iodinated Contrast Media Nausea And Vomiting   Home medications Prior to Admission medications   Medication Sig Start Date End Date Taking? Authorizing Provider  acetaminophen (TYLENOL) 325 MG tablet Take 2 tablets (650 mg total) by mouth every 6 (six) hours as needed for mild pain or moderate pain. 12/07/21  Yes Arrien, York Ram, MD   albuterol (PROVENTIL HFA) 108 (90 Base) MCG/ACT inhaler Inhale 1-2 puffs into the lungs every 6 (six) hours as needed for wheezing or shortness of breath. 10/13/22  Yes Grayce Sessions, NP  amiodarone (PACERONE) 200 MG tablet TAKE 1 TABLET BY MOUTH TWICE A DAY Patient taking differently: Take 200 mg by mouth daily. 04/25/22  Yes Bensimhon, Bevelyn Buckles, MD  apixaban (ELIQUIS) 5 MG TABS tablet Take 1 tablet (5 mg total) by mouth 2 (two) times daily. 02/21/23  Yes Bensimhon, Bevelyn Buckles, MD  busPIRone (BUSPAR) 5 MG tablet TAKE ONE TABLET BY MOUTH TWICE DAILY @ 9AM & 5PM Patient taking differently: Take 5 mg by mouth 2 (two) times daily. Take 1 tablet by mouth at 9 am, 5 pm. 09/06/22  Yes Grayce Sessions, NP  colchicine 0.6 MG tablet Take 1/2 (one-half) tablet by mouth once daily 11/23/22  Yes Hoy Register, MD  diphenhydrAMINE (BENADRYL) 25 mg capsule Take 25 mg by mouth every 6 (six) hours as needed for allergies or itching.   Yes [provider]  Dulaglutide (TRULICITY) 1.5 MG/0.5ML SOPN Inject 1.5 mg into the skin once a week. 07/05/22  Yes Grayce Sessions, NP  DULoxetine (CYMBALTA) 60 MG capsule TAKE ONE CAPSULE (60MG  TOTAL) BY MOUTH DAILY AT 9AM Patient taking differently: Take 60 mg by mouth in the morning. 02/12/23  Yes Grayce Sessions, NP  EPINEPHrine 0.3 mg/0.3 mL IJ SOAJ injection Inject 0.3 mg into the muscle as needed for anaphylaxis. 06/25/20  Yes Grayce Sessions, NP  fluticasone (FLONASE) 50 MCG/ACT nasal spray Place 2 sprays into both nostrils daily as needed for allergies or rhinitis. 09/22/22  Yes Grayce Sessions, NP  insulin lispro (HUMALOG KWIKPEN) 100 UNIT/ML KwikPen Inject 10 Units into the skin 3 (three) times daily. Patient taking differently: Inject 5-10 Units into the skin 3 (three) times daily. Sling scale 105= 1-2 units 106= 07/05/22  Yes Grayce Sessions, NP  lidocaine-prilocaine (EMLA) cream Apply 1 Application topically Every Tuesday,Thursday,and  Saturday with dialysis. 06/01/22  Yes [provider]  loratadine (CLARITIN) 10 MG tablet TAKE ONE TABLET BY MOUTH DAILY AT 9AM Patient taking differently: Take 10 mg by mouth daily. 09/06/22  Yes Grayce Sessions, NP  metoprolol succinate (TOPROL-XL) 25 MG 24 hr tablet TAKE 1 TABLET BY MOUTH IN THE MORNING AND AT BEDTIME Patient taking differently: Take 25 mg by mouth 2 (two) times daily. 1 tablet by mouth in the Morning, and 1 at bedtime. 09/12/22  Yes Lanier Prude, MD  Multiple Vitamins-Minerals (MULTIVITAMIN WOMEN PO) Take 1 tablet by mouth daily.   Yes [provider]  nitroGLYCERIN (NITROSTAT) 0.4 MG SL tablet Place 1 tablet (0.4 mg total) under the tongue every 5 (five) minutes x 3 doses as needed for chest pain. 01/08/14  Yes Rinaldo Cloud, MD  ondansetron (ZOFRAN) 4 MG tablet Take 1 tablet (4 mg total) by mouth every 6 (six) hours. 02/24/23  Yes Wynetta Fines, MD  pantoprazole (PROTONIX) 40 MG tablet TAKE ONE TABLET BY MOUTH DAILY AT 9AM Patient taking differently: Take 40 mg by mouth daily. 9am 04/17/22  Yes Grayce Sessions, NP  promethazine (PHENERGAN) 12.5 MG tablet Take 1 tablet (12.5 mg total) by mouth every 6 (six) hours as needed for nausea or vomiting. 02/21/23  Yes Mound, Rolly Salter E, FNP  rosuvastatin (CRESTOR) 10 MG tablet TAKE ONE TABLET BY MOUTH DAILY AT 5PM Patient taking differently: Take 10 mg by mouth daily. 5 pm 12/05/22  Yes Bensimhon, Bevelyn Buckles, MD  torsemide (DEMADEX) 100 MG tablet Take 100 mg by mouth See admin instructions.  Take Sunday,Monday, Wednesday and Friday 04/02/22  Yes [provider]  Blood Glucose Monitoring Suppl (ACCU-CHEK GUIDE) w/Device KIT Use to check blood sugar TID. 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 Dx: E11.69 Last reading: 105, 02-20-2023, Fasting. 06/28/22   Hoy Register, MD  glucose blood (ACCU-CHEK GUIDE) test strip USE TO CHECK BLOOD SUGAR 3 TIMES DAILY 04/03/22   Hoy Register, MD     Vitals:   03/15/23 0915 03/15/23 0930 03/15/23 0945 03/15/23 1100  BP:   (!) 151/132 108/72  Pulse:      Resp: (!) 21 20 18 16   Temp:      TempSrc:      SpO2:      Weight:      Height:       Exam Gen alert, no distress No rash, cyanosis or gangrene Sclera anicteric, throat clear  No jvd or bruits Chest clear bilat to bases, no rales/ wheezing RRR no RG, sternal scar Abd soft ntnd no mass or ascites +bs obese GU deferred MS no joint effusions or deformity Ext no pitting LE/ UE edema Neuro is alert, Ox 3 , nf    LUA AVF +bruit/ RIJ TDC     Home meds include - tylenol, proventil, benadryl, trulicity, epi injection, fluticasone, insulin lispro, emla cream, loratadine, MVI, sl ntg, ondansetron, promethazine, torsemide 100mg  non hd days, amiodarone, apixaban, buspirone, colchicine 0.6 1/2 tab daily, duloxetine, metoprolol xl 25 every day, protonix, rosuvastatin     OP HD: SW TTS  4h  400/600   125.2kg  3K/2.5Ca bath  RIJ TDC/ AVF heparin 2500 - per pt they been using 2 needles in the AVF for about 1-2 wks - hectorol IV 2 mcg three times per week - venofer 50mg  IV weekly - mircera 60 mcg IV q 2 wks, last 7/16, due 7/30 - last Hb 10.4, pth 408 - last OP HD 7/23, post wt 124.9kg     Assessment/ Plan: Vertigo/ light-headedness - orthostatics in ED were + initially. Redone this am they were negative. Agree w/ holding torsemide. Looks euvolemic on exam. Dry wt has been stable at OP unit. CXR shows "congestion" but that appears to be chronic. Is on metoprolol for atrial fib. Per pmd.  ESRD - on HD TTS. HD today. Has not missed HD.  HTN/ volume - BP's here no to a bit high. Getting metoprolol xl 25 every day at home. Cont meds. UF to dry wt w/ HD today.  Anemia esrd - Hb  10-11 range, follow. Esa due on 7/30.  MBD ckd - Ca in range, add on phos. Cont IV vdra w/ hd.  Atrial fib - on BB, eliquis, amio COPD/ OSA - stable DM2 - on insulin + other medication       Vinson Moselle  MD [facility] 03/15/2023, 11:19 AM  Recent Labs  Lab 03/14/23 1210 03/15/23 0045  HGB 10.7* 10.8*  CALCIUM 9.4 9.4  CREATININE 7.48* 8.64*  K 4.7 4.3   Inpatient medications:  amiodarone  200 mg Oral Daily   apixaban  5 mg Oral BID   busPIRone  5 mg Oral BID   colchicine  0.6 mg Oral Daily   DULoxetine  60 mg Oral Daily   insulin aspart  0-5 Units Subcutaneous QHS   insulin aspart  0-6 Units Subcutaneous TID WC   metoprolol succinate  25 mg Oral Daily   pantoprazole  40 mg Oral Daily   rosuvastatin  10 mg Oral Daily   sodium chloride flush  3 mL Intravenous Q12H    acetaminophen **OR** acetaminophen, HYDROmorphone (DILAUDID) injection, meclizine

## 2023-03-15 NOTE — Telephone Encounter (Signed)
Called pt to check on her and she was admitted to the hospital and they are running test and have not figured out what is going on.   I rescheduled her to come in next week.

## 2023-03-15 NOTE — Procedures (Signed)
I was present at this dialysis session, have reviewed the session and made  appropriate changes Vinson Moselle MD  [facility] 03/15/2023, 2:32 PM

## 2023-03-16 DIAGNOSIS — I951 Orthostatic hypotension: Secondary | ICD-10-CM | POA: Diagnosis not present

## 2023-03-16 LAB — GLUCOSE, CAPILLARY
Glucose-Capillary: 103 mg/dL — ABNORMAL HIGH (ref 70–99)
Glucose-Capillary: 123 mg/dL — ABNORMAL HIGH (ref 70–99)
Glucose-Capillary: 103 mg/dL — ABNORMAL HIGH (ref 70–99)
Glucose-Capillary: 135 mg/dL — ABNORMAL HIGH (ref 70–99)

## 2023-03-16 LAB — CBC: WBC: 5.4 10*3/uL (ref 4.0–10.5)

## 2023-03-16 LAB — PHOSPHORUS: Phosphorus: 4.6 mg/dL (ref 2.5–4.6)

## 2023-03-16 MED ORDER — PROCHLORPERAZINE EDISYLATE 10 MG/2ML IJ SOLN
10.0000 mg | Freq: Four times a day (QID) | INTRAMUSCULAR | Status: DC | PRN
  Administered 2023-03-16 – 2023-03-17 (×3): 10 mg via INTRAVENOUS
  Filled 2023-03-16 (×5): qty 2

## 2023-03-16 NOTE — Discharge Instructions (Signed)
Information on my medicine - ELIQUIS® (apixaban) ° °This medication education was reviewed with me or my healthcare representative as part of my discharge preparation. ° °Why was Eliquis® prescribed for you? °Eliquis® was prescribed for you to reduce the risk of a blood clot forming that can cause a stroke if you have a medical condition called atrial fibrillation (a type of irregular heartbeat). ° °What do You need to know about Eliquis® ? °Take your Eliquis® TWICE DAILY - one tablet in the morning and one tablet in the evening with or without food. If you have difficulty swallowing the tablet whole please discuss with your pharmacist how to take the medication safely. ° °Take Eliquis® exactly as prescribed by your doctor and DO NOT stop taking Eliquis® without talking to the doctor who prescribed the medication.  Stopping may increase your risk of developing a stroke.  Refill your prescription before you run out. ° °After discharge, you should have regular check-up appointments with your healthcare provider that is prescribing your Eliquis®.  In the future your dose may need to be changed if your kidney function or weight changes by a significant amount or as you get older. ° °What do you do if you miss a dose? °If you miss a dose, take it as soon as you remember on the same day and resume taking twice daily.  Do not take more than one dose of ELIQUIS at the same time to make up a missed dose. ° °Important Safety Information °A possible side effect of Eliquis® is bleeding. You should call your healthcare provider right away if you experience any of the following: °? Bleeding from an injury or your nose that does not stop. °? Unusual colored urine (red or dark brown) or unusual colored stools (red or black). °? Unusual bruising for unknown reasons. °? A serious fall or if you hit your head (even if there is no bleeding). ° °Some medicines may interact with Eliquis® and might increase your risk of bleeding or  clotting while on Eliquis®. To help avoid this, consult your healthcare provider or pharmacist prior to using any new prescription or non-prescription medications, including herbals, vitamins, non-steroidal anti-inflammatory drugs (NSAIDs) and supplements. ° °This website has more information on Eliquis® (apixaban): http://www.eliquis.com/eliquis/home ° °

## 2023-03-16 NOTE — TOC Initial Note (Signed)
Transition of Care [facility]) - Initial/Assessment Note    Patient Details  Name: [name] MRN: [id number] Date of Birth: 02-Mar-1956  Transition of Care [facility]) CM/SW Contact:    Tom-Johnson, Hershal Coria, RN Phone Number: 03/16/2023, 4:04 PM  Clinical Narrative:                  CM spoke with patient, husband Mariana Kaufman and grandson Waldron Labs at bedside about needs for post hospital transition.  Presented ton the ED with Lightheadedness, Dizziness and Left-sided Numbness. Found to be positive for Orthostatic Hypotension. Patient has Hx of ESRD and on a TTS scheduled dialysis days. Husband transports. Hx of DVT and A-fib, on Eliquis. Nephrology following for inpatient dialysis.   From home with daughter and grandchildren, has two supportive children. On disability. Has a cane at home. Rollator ordered from [employer], Earna Coder to deliver to patient at bedside prior to discharge. PCP is Randa Evens, Kinnie Scales, NP and uses [facility] on [address].  Outpatient Vestibular PT recommended, patient chose [facility] on [address]. Order placed and info on AVS.    CM will continue to follow as patient progresses with care towards discharge.    Expected Discharge Plan: OP Rehab Barriers to Discharge: Continued Medical Work up   Patient Goals and CMS Choice Patient states their goals for this hospitalization and ongoing recovery are:: To return home CMS Medicare.gov Compare Post Acute Care list provided to:: Patient Choice offered to / list presented to : Patient, Spouse, Adult Children      Expected Discharge Plan and Services   Discharge Planning Services: CM Consult Post Acute Care Choice:  (Outpatient Vestibular Rehab) Living arrangements for the past 2 months: Apartment                 DME Arranged: Bedside commode DME Agency: [employer] Date DME Agency Contacted: 03/16/23 Time DME Agency Contacted: 1520 Representative spoke with at DME Agency: Earna Coder HH Arranged: NA HH  Agency: NA        Prior Living Arrangements/Services Living arrangements for the past 2 months: Apartment Lives with:: Adult Children Patient language and need for interpreter reviewed:: Yes Do you feel safe going back to the place where you live?: Yes      Need for Family Participation in Patient Care: Yes (Comment) Care giver support system in place?: Yes (comment)   Criminal Activity/Legal Involvement Pertinent to Current Situation/Hospitalization: No - Comment as needed  Activities of Daily Living   ADL Screening (condition at time of admission) Patient's cognitive ability adequate to safely complete daily activities?: Yes Patient able to express need for assistance with ADLs?: Yes Independently performs ADLs?: Yes (appropriate for developmental age)  Permission Sought/Granted Permission sought to share information with : Case Production designer, theatre/television/film, Magazine features editor, Family Supports Permission granted to share information with : Yes, Verbal Permission Granted              Emotional Assessment Appearance:: Appears stated age Attitude/Demeanor/Rapport: Engaged, Gracious Affect (typically observed): Accepting, Appropriate, Calm, Hopeful, Pleasant Orientation: : Oriented to Self, Oriented to Place, Oriented to  Time, Oriented to Situation Alcohol / Substance Use: Not Applicable Psych Involvement: No (comment)  Admission diagnosis:  Orthostatic hypotension [I95.1] Dizziness [R42] Longstanding persistent atrial fibrillation (HCC) [I48.11] Patient Active Problem List   Diagnosis Date Noted   Orthostatic hypotension 03/14/2023   Left sided numbness 03/14/2023   Epigastric pain 02/21/2023   Type 2 diabetes mellitus without complications (HCC) 02/21/2023   Screening for malignant neoplasm  of colon 02/21/2023   Nausea and vomiting 02/21/2023   Obesity due to excess calories 02/21/2023   Hypercoagulable state due to persistent atrial fibrillation (HCC) 10/20/2022   HCAP  (healthcare-associated pneumonia) 06/21/2022   ESRD on dialysis (HCC) 06/21/2022   OSA on CPAP 06/21/2022   Acute respiratory failure with hypoxia (HCC)    Acute hypoxic respiratory failure (HCC) 06/20/2022   Acute kidney injury superimposed on chronic kidney disease (HCC) 03/18/2022   Class 3 obesity (HCC) 03/18/2022   Depression 01/21/2022   Chronic deep vein thrombosis (DVT) of other vein of right upper extremity (HCC) 12/29/2021   Abnormal weight loss 11/16/2021   Personal history of colonic polyps 11/16/2021   Rib pain 11/16/2021   Anxiety and depression 08/26/2021   Essential hypertension 08/26/2021   GERD (gastroesophageal reflux disease) 08/26/2021   COPD (chronic obstructive pulmonary disease) (HCC) 07/29/2021   QT prolongation 07/29/2021   Biventricular heart failure (HCC)    Atrial flutter (HCC)    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   Encounter for preoperative dental examination    Teeth missing    Gingivitis    Accretions on teeth    Atrial fibrillation, permanent (HCC) 11/02/2020   Acute on chronic right heart failure (HCC) 05/06/2020   NICM (nonischemic cardiomyopathy) (HCC) 04/01/2020   Family history of heart disease 04/01/2020   Mitral regurgitation 04/01/2020   Acute on chronic left systolic heart failure (HCC) 07/01/2018   Acute on chronic diastolic CHF (congestive heart failure) (HCC) 07/01/2018   Chronic atrial fibrillation (HCC) 12/26/2017   Diastolic dysfunction 12/26/2017   Diabetes mellitus type 2 in obese 12/26/2017   Hypoxia 09/21/2011   Type 2 diabetes mellitus with hyperlipidemia (HCC) 02/04/2010   AF (paroxysmal atrial fibrillation) (HCC) 02/04/2010   Gastroparesis 02/04/2010   PCP:  Grayce Sessions, NP Pharmacy:   [id number] - [place], [place] - [address] [place] [address] Phone: [phone] Fax: [phone]  [facility] - [place], [place]  - [address] [place] [address] Phone: [phone] Fax: [phone]     Social Determinants of Health (SDOH) Social History: SDOH Screenings   Food Insecurity: No Food Insecurity (03/16/2023)  Housing: Medium Risk (03/31/2022)  Transportation Needs: No Transportation Needs (03/16/2023)  Alcohol Screen: Low Risk  (03/31/2022)  Depression (PHQ2-9): Medium Risk (11/15/2022)  Financial Resource Strain: Low Risk  (03/31/2022)  Physical Activity: Sufficiently Active (03/31/2022)  Social Connections: Socially Isolated (03/31/2022)  Stress: Stress Concern Present (03/31/2022)  Tobacco Use: Low Risk  (03/14/2023)   SDOH Interventions: Transportation Interventions: Intervention Not Indicated, Inpatient TOC, Patient Resources (Friends/Family)   Readmission Risk Interventions    03/16/2023    3:57 PM 01/20/2022    5:06 PM 08/31/2021    1:49 PM  Readmission Risk Prevention Plan  Transportation Screening Complete Complete Complete  Medication Review (RN Care Manager) Referral to Pharmacy  Referral to Pharmacy  PCP or Specialist appointment within 3-5 days of discharge Complete Complete Complete  HRI or Home Care Consult Complete  Complete  SW Recovery Care/Counseling Consult Complete Complete Complete  Palliative Care Screening Not Applicable Not Applicable Not Applicable  Skilled Nursing Facility Not Applicable Not Applicable Not Applicable

## 2023-03-16 NOTE — Progress Notes (Cosign Needed)
    Durable Medical Equipment  (From admission, onward)           Start     Ordered   03/16/23 1544  For home use only DME 4 wheeled rolling walker with seat  Once       Question:  Patient needs a walker to treat with the following condition  Answer:  Gait instability   03/16/23 1544

## 2023-03-16 NOTE — Progress Notes (Signed)
[facility] Progress Note   Subjective:  Seen in room. Feels ok today. BP remains low, hasn't gotten up yet today but no dizziness for now. Reports unable to use her L AVF with HD yesterday (infiltrated) - otherwise it went fine, net UF 2.7L.    Objective Vitals:   03/16/23 0044 03/16/23 0100 03/16/23 0441 03/16/23 0734  BP: (!) 89/60  (!) 89/56 97/70  Pulse: 84  84 77  Resp: 18 15 18 17   Temp: 97.7 F (36.5 C)  98.4 F (36.9 C) 97.6 F (36.4 C)  TempSrc: Oral   Oral  SpO2: 97%  100% 100%  Weight:      Height:       Physical Exam General: Well appearing woman, NAD. Room air. Heart: Irregularly irregular, no murmur Lungs: CTA anteriorly Abdomen: soft Extremities: no LE edema Dialysis Access:  LUE AVF + bruit, TDC in R chest  Additional Objective Labs: Basic Metabolic Panel: Recent Labs  Lab 03/14/23 1210 03/15/23 0045 03/16/23 0304  NA 138 132* 133*  K 4.7 4.3 4.1  CL 96* 91* 93*  CO2 28 26 28   GLUCOSE 124* 99 120*  BUN 27* 35* 29*  CREATININE 7.48* 8.64* 6.49*  CALCIUM 9.4 9.4 9.0   CBC: Recent Labs  Lab 03/14/23 1210 03/15/23 0045 03/16/23 0304  WBC 5.9 6.9 5.4  HGB 10.7* 10.8* 10.5*  HCT 34.3* 33.9* 34.3*  MCV 100.0 99.1 104.9*  PLT 148* 146* 125*   Studies/Results: MR BRAIN WO CONTRAST  Result Date: 03/15/2023 CLINICAL DATA:  Initial evaluation for neuro deficit, stroke suspected. EXAM: MRI HEAD WITHOUT CONTRAST TECHNIQUE: Multiplanar, multiecho pulse sequences of the brain and surrounding structures were obtained without intravenous contrast. COMPARISON:  Prior CT from 03/14/2023. FINDINGS: Brain: Examination mildly degraded by motion artifact. Cerebral volume within normal limits. Scattered patchy T2/FLAIR hyperintensity involving the periventricular deep white matter both cerebral hemispheres, most like related chronic microvascular ischemic disease, mild for age. No evidence for acute or subacute ischemia. Gray-white matter  differentiation maintained. No areas of chronic cortical infarction. No acute intracranial hemorrhage. Few punctate chronic micro hemorrhages noted within the right cerebral hemisphere with a likely small vessel related. No mass lesion, midline shift or mass effect. No hydrocephalus or extra-axial fluid collection. Pituitary gland suprasellar region within normal limits. Vascular: Major intracranial vascular flow voids are maintained. Skull and upper cervical spine: Cranial junction with normal limits. Decreased T1 signal intensity seen within the bone marrow the visualized upper spine, nonspecific, but most commonly related to anemia, smoking or obesity. No scalp soft tissue abnormality. Sinuses/Orbits: Prior bilateral ocular lens replacement. Mild bilateral exophthalmos. Paranasal sinuses are clear. Trace bilateral mastoid effusions, of doubtful significance. Visualized nasopharynx unremarkable. Other: None. IMPRESSION: 1. No acute intracranial abnormality. 2. Mild chronic microvascular ischemic disease for age. Electronically Signed   By: Rise Mu M.D.   On: 03/15/2023 02:00   CT Head Wo Contrast  Result Date: 03/14/2023 CLINICAL DATA:  Headache, neuro deficit EXAM: CT HEAD WITHOUT CONTRAST TECHNIQUE: Contiguous axial images were obtained from the base of the skull through the vertex without intravenous contrast. RADIATION DOSE REDUCTION: This exam was performed according to the departmental dose-optimization program which includes automated exposure control, adjustment of the mA and/or kV according to patient size and/or use of iterative reconstruction technique. COMPARISON:  MRI and CT scan head from 06/28/2022. FINDINGS: Brain: No evidence of acute infarction, hemorrhage, hydrocephalus, extra-axial collection or mass lesion/mass effect. There is bilateral periventricular hypodensity, which is non-specific  but most likely seen in the settings of microvascular ischemic changes. Mild in extent.  Vascular: No hyperdense vessel or unexpected calcification. Skull: Normal. Negative for fracture or focal lesion. Note is made of hyperostosis frontalis interna. Sinuses/Orbits: No acute finding. Other: None. IMPRESSION: 1. No acute intracranial abnormality. 2. Mild microvascular ischemic changes. Electronically Signed   By: Jules Schick M.D.   On: 03/14/2023 12:20   DG Chest Portable 1 View  Result Date: 03/14/2023 CLINICAL DATA:  shob EXAM: PORTABLE CHEST 1 VIEW COMPARISON:  02/24/2023. FINDINGS: Probable left basilar atelectatic changes. Bilateral lateral costophrenic angles are clear. Central pulmonary vascular congestion, which may be accentuated by low lung volume. Stable enlarged cardio-mediastinal silhouette. Sternotomy wires noted. There is probable left atrial appendage closure device. Right IJ hemodialysis catheter noted with its tip overlying the cavoatrial junction region. No acute osseous abnormalities. The soft tissues are within normal limits. IMPRESSION: 1. Cardiomegaly and central pulmonary vascular congestion. 2. Left basilar atelectatic changes. Electronically Signed   By: Jules Schick M.D.   On: 03/14/2023 12:17  \  Medications:   amiodarone  200 mg Oral Daily   apixaban  5 mg Oral BID   busPIRone  5 mg Oral BID   Chlorhexidine Gluconate Cloth  6 each Topical Q0600   colchicine  0.3 mg Oral Daily   colchicine  0.6 mg Oral Daily   DULoxetine  60 mg Oral Daily    HYDROmorphone (DILAUDID) injection  0.5-1 mg Intravenous Once in dialysis   insulin aspart  0-5 Units Subcutaneous QHS   insulin aspart  0-6 Units Subcutaneous TID WC   metoprolol succinate  25 mg Oral Daily   pantoprazole  40 mg Oral Daily   rosuvastatin  10 mg Oral Daily   sodium chloride flush  3 mL Intravenous Q12H    Dialysis Orders: TTS at AF 4hr, 400/600, EDW 125.2kg, 3K/2.5Ca, TDC + AVF, heparin 2500 unit bolus - Hectoral IV q HD - Mircera IV q 2 weeks (last 7/16, due 7/30) - Venofer  50mg  IV weekly  Assessment/Plan: 1. Lightheadedness/L sided numbness/?vertigo: Head CT and brain MRI without acute findings. Initially with + orthostatic hypotension, negative on repeat. Torsemide on hold. 2. ESRD: Continue HD on usual TTS schedule - next tomorrow. See how feels today and how BP runs, we can add mido if needed. 3. HypoTN/volume: BP low here, on metoprolol for A-fib, no other BP meds. No edema. Appears fairly euvolemic on exam. 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 5. Secondary hyperparathyroidism: Ca ok, Phos pending.  6. Atrial fibrillation: On amiodarone, BB, Eliquis. ?Check TSH. 7. T2DM: On insulin, per primary.  Ozzie Hoyle, PA-C 03/16/2023, 10:28 AM  [employer]

## 2023-03-16 NOTE — Progress Notes (Signed)
PROGRESS NOTE    [name]  WUJ:[id number] DOB: 1955/08/27 DOA: 03/14/2023 PCP: Grayce Sessions, NP   Brief Narrative:  HPI: [name] is a 67 y.o. female with medical history significant for type 2 diabetes mellitus, hypertension, COPD, chronic HFpEF, OSA on CPAP, history of DVT, atrial fibrillation on Eliquis, and ESRD on hemodialysis who presents to the emergency department with lightheadedness, dizziness, and left-sided numbness.   Patient began to notice left-sided numbness, room spinning sensation, and worsening lightheadedness upon standing 2 or 3 days ago.  She has been feeling as though she might pass out whenever she stands up but has also had intermittent room spinning sensation, even while sitting or lying down.  Additionally, patient reports numbness involving the left face, left arm, and left leg.  She reports completing dialysis yesterday but had some bleeding from her fistula afterwards and had acute worsening in her lightheadedness with treatment.   ED Course: Upon arrival to the ED, patient is found to be afebrile and saturating upper 90s on room air with labile heart rate and blood pressure.  EKG demonstrates atrial fibrillation.  Chest x-ray notable for cardiomegaly and pulmonary vascular congestion.  No acute findings noted on head CT.  Labs are most notable for normal potassium, normal bicarbonate, BUN 27, hemoglobin 10.7, normal troponin x 2, and BNP 153.   Patient was treated with GI cocktail, Benadryl, Dilaudid, Versed, Zofran, and Lopressor in the ED.  Assessment & Plan:   Principal Problem:   Orthostatic hypotension Active Problems:   AF (paroxysmal atrial fibrillation) (HCC)   COPD (chronic obstructive pulmonary disease) (HCC)   Type 2 diabetes mellitus with hyperlipidemia (HCC)   QT prolongation   ESRD on dialysis (HCC)   OSA on CPAP   Left sided numbness  Vertigo; left-sided numbness/dizziness/orthostatic hypotension: -Positive  orthostatics upon arrival.  Patient history of ESRD on dialysis.  Left-sided numbness is improving, MRI negative for stroke.  No focal weakness.  Wondering if anxiety is playing a role.  Patient still feels dizzy.  RN could not even check orthostatics because patient was extremely dizzy when she stood up.  Could not walk well as well.  I have consulted PT OT.  Due to persistent symptoms, we are going to observe her in the right.  May need midodrine.   ESRD on HD, TTS schedule. -Nephrology on board, next dialysis tomorrow.   Atrial fibrillation  -Rate is controlled.  Continue Eliquis, amiodarone, and metoprolol    COPD; OSA  - Not in exacerbation on admission  - Continue CPAP, bronchodilator     Type II DM  - A1c was 6.7% in April 2023 .  Blood sugar controlled on SSI.  DVT prophylaxis: Eliquis   Code Status: Full Code  Family Communication:  None present at bedside.  Plan of care discussed with patient in length and he/she verbalized understanding and agreed with it.  Status is: Inpatient Remains inpatient appropriate because: Patient is still symptomatic with dizziness     Estimated body mass index is 43.33 kg/m as calculated from the following:   Height as of this encounter: 5\' 7"  (1.702 m).   Weight as of this encounter: 125.5 kg.    Nutritional Assessment: Body mass index is 43.33 kg/m.Marland Kitchen Seen by dietician.  I agree with the assessment and plan as outlined below: Nutrition Status:        . Skin Assessment: I have examined the patient's skin and I agree with the wound assessment  as performed by the wound care RN as outlined below:    Consultants:  Nephrology  Procedures:  None  Antimicrobials:  Anti-infectives (From admission, onward)    None         Subjective: Seen and examined this awning.  The nurse was in the process of checking her orthostatics.  Patient was sitting and still feeling dizzy.  Once the left, I was reported that patient could not  stand up without symptoms so orthostatics could not be completed.  Objective: Vitals:   03/16/23 0044 03/16/23 0100 03/16/23 0441 03/16/23 0734  BP: (!) 89/60  (!) 89/56 97/70  Pulse: 84  84 77  Resp: 18 15 18 17   Temp: 97.7 F (36.5 C)  98.4 F (36.9 C) 97.6 F (36.4 C)  TempSrc: Oral   Oral  SpO2: 97%  100% 100%  Weight:      Height:        Intake/Output Summary (Last 24 hours) at 03/16/2023 1207 Last data filed at 03/16/2023 0815 Gross per 24 hour  Intake 110 ml  Output 2.7 ml  Net 107.3 ml   Filed Weights   03/14/23 1103 03/15/23 1309 03/15/23 1940  Weight: 124.7 kg 128 kg 125.5 kg    Examination:  General exam: Appears calm and comfortable  Respiratory system: Clear to auscultation. Respiratory effort normal. Cardiovascular system: S1 & S2 heard, RRR. No JVD, murmurs, rubs, gallops or clicks. No pedal edema. Gastrointestinal system: Abdomen is nondistended, soft and nontender. No organomegaly or masses felt. Normal bowel sounds heard. Central nervous system: Alert and oriented. No focal neurological deficits. Extremities: Symmetric 5 x 5 power. Skin: No rashes, lesions or ulcers.  Psychiatry: Judgement and insight appear normal. Mood & affect appropriate.   Data Reviewed: I have personally reviewed following labs and imaging studies  CBC: Recent Labs  Lab 03/14/23 1210 03/15/23 0045 03/16/23 0304  WBC 5.9 6.9 5.4  HGB 10.7* 10.8* 10.5*  HCT 34.3* 33.9* 34.3*  MCV 100.0 99.1 104.9*  PLT 148* 146* 125*   Basic Metabolic Panel: Recent Labs  Lab 03/14/23 1210 03/15/23 0045 03/16/23 0304  NA 138 132* 133*  K 4.7 4.3 4.1  CL 96* 91* 93*  CO2 28 26 28   GLUCOSE 124* 99 120*  BUN 27* 35* 29*  CREATININE 7.48* 8.64* 6.49*  CALCIUM 9.4 9.4 9.0   GFR: Estimated Creatinine Clearance: 11.7 mL/min (A) (by C-G formula based on SCr of 6.49 mg/dL (H)). Liver Function Tests: No results for input(s): "AST", "ALT", "ALKPHOS", "BILITOT", "PROT", "ALBUMIN" in the  last 168 hours. No results for input(s): "LIPASE", "AMYLASE" in the last 168 hours. No results for input(s): "AMMONIA" in the last 168 hours. Coagulation Profile: No results for input(s): "INR", "PROTIME" in the last 168 hours. Cardiac Enzymes: No results for input(s): "CKTOTAL", "CKMB", "CKMBINDEX", "TROPONINI" in the last 168 hours. BNP (last 3 results) No results for input(s): "PROBNP" in the last 8760 hours. HbA1C: Recent Labs    03/15/23 0045  HGBA1C 6.0*   CBG: Recent Labs  Lab 03/15/23 0804 03/15/23 1213 03/15/23 1945 03/16/23 0730 03/16/23 1136  GLUCAP 108* 167* 120* 103* 123*   Lipid Profile: No results for input(s): "CHOL", "HDL", "LDLCALC", "TRIG", "CHOLHDL", "LDLDIRECT" in the last 72 hours. Thyroid Function Tests: No results for input(s): "TSH", "T4TOTAL", "FREET4", "T3FREE", "THYROIDAB" in the last 72 hours. Anemia Panel: No results for input(s): "VITAMINB12", "FOLATE", "FERRITIN", "TIBC", "IRON", "RETICCTPCT" in the last 72 hours. Sepsis Labs: No results for input(s): "PROCALCITON", "LATICACIDVEN"  in the last 168 hours.  No results found for this or any previous visit (from the past 240 hour(s)).   Radiology Studies: MR BRAIN WO CONTRAST  Result Date: 03/15/2023 CLINICAL DATA:  Initial evaluation for neuro deficit, stroke suspected. EXAM: MRI HEAD WITHOUT CONTRAST TECHNIQUE: Multiplanar, multiecho pulse sequences of the brain and surrounding structures were obtained without intravenous contrast. COMPARISON:  Prior CT from 03/14/2023. FINDINGS: Brain: Examination mildly degraded by motion artifact. Cerebral volume within normal limits. Scattered patchy T2/FLAIR hyperintensity involving the periventricular deep white matter both cerebral hemispheres, most like related chronic microvascular ischemic disease, mild for age. No evidence for acute or subacute ischemia. Gray-white matter differentiation maintained. No areas of chronic cortical infarction. No acute  intracranial hemorrhage. Few punctate chronic micro hemorrhages noted within the right cerebral hemisphere with a likely small vessel related. No mass lesion, midline shift or mass effect. No hydrocephalus or extra-axial fluid collection. Pituitary gland suprasellar region within normal limits. Vascular: Major intracranial vascular flow voids are maintained. Skull and upper cervical spine: Cranial junction with normal limits. Decreased T1 signal intensity seen within the bone marrow the visualized upper spine, nonspecific, but most commonly related to anemia, smoking or obesity. No scalp soft tissue abnormality. Sinuses/Orbits: Prior bilateral ocular lens replacement. Mild bilateral exophthalmos. Paranasal sinuses are clear. Trace bilateral mastoid effusions, of doubtful significance. Visualized nasopharynx unremarkable. Other: None. IMPRESSION: 1. No acute intracranial abnormality. 2. Mild chronic microvascular ischemic disease for age. Electronically Signed   By: Rise Mu M.D.   On: 03/15/2023 02:00    Scheduled Meds:  amiodarone  200 mg Oral Daily   apixaban  5 mg Oral BID   busPIRone  5 mg Oral BID   Chlorhexidine Gluconate Cloth  6 each Topical Q0600   colchicine  0.3 mg Oral Daily   colchicine  0.6 mg Oral Daily   DULoxetine  60 mg Oral Daily    HYDROmorphone (DILAUDID) injection  0.5-1 mg Intravenous Once in dialysis   insulin aspart  0-5 Units Subcutaneous QHS   insulin aspart  0-6 Units Subcutaneous TID WC   metoprolol succinate  25 mg Oral Daily   pantoprazole  40 mg Oral Daily   rosuvastatin  10 mg Oral Daily   sodium chloride flush  3 mL Intravenous Q12H   Continuous Infusions:   LOS: 1 day   Hughie Closs, MD [facility]  03/16/2023, 12:07 PM   *Please note that this is a verbal dictation therefore any spelling or grammatical errors are due to the "Dragon [employer] One" system interpretation.  Please page via Amion and do not message via secure chat for urgent  patient care matters. Secure chat can be used for non urgent patient care matters.  How to contact the [facility] Attending or Consulting provider 7A - 7P or covering provider during after hours 7P -7A, for this patient?  Check the care team in [facility] and look for a) attending/consulting [facility] provider listed and b) the [facility] team listed. Page or secure chat 7A-7P. Log into [email or web address] and use [place]'s universal password to access. If you do not have the password, please contact the hospital operator. Locate the [facility] provider you are looking for under [facility] and page to a number that you can be directly reached. If you still have difficulty reaching the provider, please page the [facility] (Director on Call) for the Hospitalists listed on amion for assistance.

## 2023-03-16 NOTE — Progress Notes (Signed)
   03/16/23 0100  BiPAP/CPAP/SIPAP  Reason BIPAP/CPAP not in use Non-compliant

## 2023-03-16 NOTE — Progress Notes (Signed)
Patient refused Cpap for the night
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
## 2023-03-17 DIAGNOSIS — R42 Dizziness and giddiness: Secondary | ICD-10-CM

## 2023-03-17 LAB — TSH: TSH: 2.415 u[IU]/mL (ref 0.350–4.500)

## 2023-03-17 LAB — GLUCOSE, CAPILLARY
Glucose-Capillary: 81 mg/dL (ref 70–99)
Glucose-Capillary: 87 mg/dL (ref 70–99)
Glucose-Capillary: 107 mg/dL — ABNORMAL HIGH (ref 70–99)
Glucose-Capillary: 87 mg/dL (ref 70–99)

## 2023-03-17 MED ORDER — SORBITOL 70 % SOLN: 30.0000 mL | Status: DC | PRN

## 2023-03-17 MED ORDER — MIDODRINE HCL 5 MG PO TABS: 10.0000 mg | ORAL_TABLET | ORAL | Status: DC

## 2023-03-17 MED ORDER — HEPARIN SODIUM (PORCINE) 1000 UNIT/ML IJ SOLN
3800.0000 [IU] | Freq: Once | INTRAMUSCULAR | Status: AC
  Administered 2023-03-17: 3800 [IU]
  Filled 2023-03-17: qty 4

## 2023-03-17 MED ORDER — ZOLPIDEM TARTRATE 5 MG PO TABS: 5.0000 mg | ORAL_TABLET | Freq: Every evening | ORAL | Status: DC | PRN

## 2023-03-17 MED ORDER — CAMPHOR-MENTHOL 0.5-0.5 % EX LOTN: 1.0000 | TOPICAL_LOTION | Freq: Three times a day (TID) | CUTANEOUS | Status: DC | PRN

## 2023-03-17 MED ORDER — METOPROLOL SUCCINATE ER 25 MG PO TB24: 25.0000 mg | ORAL_TABLET | Freq: Every day | ORAL | Status: DC

## 2023-03-17 MED ORDER — LORATADINE 10 MG PO TABS
10.0000 mg | ORAL_TABLET | Freq: Every day | ORAL | Status: DC
  Administered 2023-03-17 – 2023-03-18 (×2): 10 mg via ORAL
  Filled 2023-03-17 (×2): qty 1

## 2023-03-17 MED ORDER — ALBUTEROL SULFATE HFA 108 (90 BASE) MCG/ACT IN AERS: 1.0000 | INHALATION_SPRAY | Freq: Four times a day (QID) | RESPIRATORY_TRACT | Status: DC | PRN

## 2023-03-17 MED ORDER — DOCUSATE SODIUM 283 MG RE ENEM
1.0000 | ENEMA | RECTAL | Status: DC | PRN
  Administered 2023-03-17: 283 mg via RECTAL
  Filled 2023-03-17 (×2): qty 1

## 2023-03-17 MED ORDER — CALCIUM CARBONATE ANTACID 1250 MG/5ML PO SUSP: 500.0000 mg | Freq: Four times a day (QID) | ORAL | Status: DC | PRN

## 2023-03-17 MED ORDER — ONDANSETRON HCL 4 MG PO TABS
4.0000 mg | ORAL_TABLET | Freq: Four times a day (QID) | ORAL | Status: DC | PRN
  Administered 2023-03-18: 4 mg via ORAL
  Filled 2023-03-17: qty 1

## 2023-03-17 MED ORDER — ONDANSETRON HCL 4 MG/2ML IJ SOLN
4.0000 mg | Freq: Four times a day (QID) | INTRAMUSCULAR | Status: DC | PRN
  Filled 2023-03-17: qty 2

## 2023-03-17 MED ORDER — HEPARIN SODIUM (PORCINE) 1000 UNIT/ML DIALYSIS
2000.0000 [IU] | Freq: Once | INTRAMUSCULAR | Status: AC
  Administered 2023-03-17: 2000 [IU] via INTRAVENOUS_CENTRAL
  Filled 2023-03-17: qty 2

## 2023-03-17 MED ORDER — NEPRO/CARBSTEADY PO LIQD: 237.0000 mL | Freq: Three times a day (TID) | ORAL | Status: DC | PRN

## 2023-03-17 MED ORDER — HYDROXYZINE HCL 25 MG PO TABS: 25.0000 mg | ORAL_TABLET | Freq: Three times a day (TID) | ORAL | Status: DC | PRN

## 2023-03-17 NOTE — Progress Notes (Signed)
   [facility] Progress Note   Subjective:  Seen on HD - 1.5L UFG. C/o headache, no dyspnea. BP low - s/p dose of midodrine at start of HD.  Objective Vitals:   03/17/23 0743 03/17/23 0753 03/17/23 0800 03/17/23 0830  BP: 100/62 93/60 (!) 114/59 101/64  Pulse: 73 83 72 84  Resp: (!) 23 20 16 17   Temp: (!) 97.5 F (36.4 C)     TempSrc: Oral     SpO2: 98% 100% 100% 99%  Weight: 126.8 kg     Height:       Physical Exam General: Well appearing woman, NAD. Room air. Heart: Irregularly irregular, no murmur Lungs: CTA anteriorly Abdomen: soft Extremities: no LE edema Dialysis Access:  LUE AVF + bruit, TDC in R chest  Additional Objective Labs: Basic Metabolic Panel: Recent Labs  Lab 03/15/23 0045 03/16/23 0304 03/17/23 0219  NA 132* 133* 133*  K 4.3 4.1 4.4  CL 91* 93* 93*  CO2 26 28 24   GLUCOSE 99 120* 115*  BUN 35* 29* 46*  CREATININE 8.64* 6.49* 8.60*  CALCIUM 9.4 9.0 9.2  PHOS  --  4.6  --    CBC: Recent Labs  Lab 03/14/23 1210 03/15/23 0045 03/16/23 0304 03/17/23 0219  WBC 5.9 6.9 5.4 5.5  HGB 10.7* 10.8* 10.5* 9.4*  HCT 34.3* 33.9* 34.3* 28.7*  MCV 100.0 99.1 104.9* 94.1  PLT 148* 146* 125* 111*   Medications:   amiodarone  200 mg Oral Daily   apixaban  5 mg Oral BID   busPIRone  5 mg Oral BID   Chlorhexidine Gluconate Cloth  6 each Topical Q0600   colchicine  0.3 mg Oral Daily   DULoxetine  60 mg Oral Daily    HYDROmorphone (DILAUDID) injection  0.5-1 mg Intravenous Once in dialysis   insulin aspart  0-5 Units Subcutaneous QHS   insulin aspart  0-6 Units Subcutaneous TID WC   metoprolol succinate  25 mg Oral Daily   midodrine  10 mg Oral Q T,Th,Sa-HD   pantoprazole  40 mg Oral Daily   rosuvastatin  10 mg Oral Daily   sodium chloride flush  3 mL Intravenous Q12H    Dialysis Orders: TTS at AF 4hr, 400/600, EDW 125.2kg, 3K/2.5Ca, TDC + AVF, heparin 2500 unit bolus - Hectoral IV q HD - Mircera IV q 2 weeks (last 7/16,  due 7/30) - Venofer 50mg  IV weekly   Assessment/Plan: Lightheadedness/L sided numbness/?vertigo: Head CT and brain MRI without acute findings. Initially with + orthostatic hypotension, torsemide on hold, midodrine added pre-HD, may need daily?. ESRD: Continue HD on usual TTS schedule - HD today, still hypotensive - midodrine 10mg  given. HypoTN/volume: BP low here, on metoprolol for A-fib, no other BP meds. No edema. Secondary hyperparathyroidism: Ca ok, Phos pending.  Atrial fibrillation: On amiodarone, BB, Eliquis. ?Check TSH. T2DM: On SSI insulin, per primary.    Ozzie Hoyle, PA-C 03/17/2023, 8:44 AM  [employer]

## 2023-03-17 NOTE — Progress Notes (Signed)
Progress Note   Patient: [name] ZOX:[id number] DOB: Jan 16, 1956 DOA: 03/14/2023     2 DOS: the patient was seen and examined on 03/17/2023   Brief hospital course: 66yo with h/o DM, HTN, COPD, HFpEF, OSA on CPAP, afib on Eliquis, and ESRD on TTS HD who presented on 7/24 with dizziness.  +orthostatics on arrival, diagnosed with vertigo.  MRI negative for CVA.    Assessment and Plan:  Vertigo Positive orthostatics upon arrival, no longer orthostatic.   Patient history of ESRD on dialysis for the last year, so unlikely dialysis dysequilibrium  Left-sided numbness is improving, MRI negative for stroke PT/OT consulting, recommend outpatient vesitbular PT Admitted to telemetry -> med surg Will add midodrine on HD days Will stop Topral XL Possible dc to home on 7/28   ESRD on HD Patient on chronic TTS HD Nephrology prn order set utilized HD on 7/25 and 7/27  Atrial fibrillation  Rate controlled with amiodarone Stop Toprol XL at this time Continue Eliquis   COPD Not in exacerbation on admission  Continue albuterol prn  OSA Continue CPAP     Type II DM  A1c was 6.7% in April 2023 Hold Trulicity  Anxiety Continue buspirone, duloxetine  HLD Continue rosuvastatin  Prolonged QT Will attempt to avoid QT-prolonging medications such as PPI, nausea meds, SSRIs Repeat EKG     Consultants: Nephrology PT  Procedures: HD on 7/25, 7/27    30 Day Unplanned Readmission Risk Score    Flowsheet Row ED to Hosp-Admission (Current) from 03/14/2023 in [facility]  30 Day Unplanned Readmission Risk Score (%) 34.42 Filed at 03/17/2023 0400       This score is the patient's risk of an unplanned readmission within 30 days of being discharged (0 -100%). The score is based on dignosis, age, lab data, medications, orders, and past utilization.   Low:  0-14.9   Medium: 15-21.9   High: 22-29.9   Extreme: 30 and above            Subjective: She continues to have vertigo.  Had vestibular PT and recommended for outpatient PT.  Denies GI bleeding (no red or dark/tarry stools) but she has had epigastric pain and is undergoing GI evaluation with Dr. Elnoria Howard.  EGD on 5/24 with erythematous mucosa in the gastric fundus and antrum and erythematous duodenopathy, pathology with focally active gastritis.   Objective: Vitals:   03/17/23 1330 03/17/23 1335  BP:    Pulse:    Resp:    Temp:    SpO2: 91% 96%    Intake/Output Summary (Last 24 hours) at 03/17/2023 1401 Last data filed at 03/17/2023 1137 Gross per 24 hour  Intake --  Output 500 ml  Net -500 ml   Filed Weights   03/15/23 1940 03/17/23 0743 03/17/23 1149  Weight: 125.5 kg 126.8 kg 126.1 kg    Exam:  General:  Appears calm and comfortable and is in NAD, in HD Eyes:  EOMI, normal lids, iris ENT:  grossly normal hearing, lips & tongue, mmm Neck:  no LAD, masses or thyromegaly Cardiovascular:  RRR, no m/r/g. No LE edema.  Respiratory:   CTA bilaterally with no wheezes/rales/rhonchi.  Normal respiratory effort. Abdomen:  soft, +epigastric TTP, ND, NABS Skin:  no rash or induration seen on limited exam Musculoskeletal:  grossly normal tone BUE/BLE, good ROM, no bony abnormality Psychiatric:  blunted mood and affect, speech fluent and appropriate, AOx3 Neurologic:  CN 2-12 grossly intact, moves  all extremities in coordinated fashion  Data Reviewed: I have reviewed the patient's lab results since admission.  Pertinent labs for today include:  Na++ 133 - stable Glucose 115 BUN 46/Creatinine 8.6/GFR 5 Anion gap 16 WBC 5.5 Hgb 9.4, 10.5 on 7/26, 10.8 on 7/25, 11.3 on 7/3 Platelets 111, down from 151 on 7/6 A1c 6    Family Communication: None present  Disposition: Status is: Inpatient Remains inpatient appropriate because: ongoing evaluation/treatment  Planned Discharge Destination: Home    Time spent: 50 minutes  Author: Jonah Blue,  MD 03/17/2023 2:01 PM  For on call review [email or web address].

## 2023-03-17 NOTE — Hospital Course (Signed)
16XW with h/o DM, HTN, COPD, HFpEF, OSA on CPAP, afib on Eliquis, and ESRD on TTS HD who presented on 7/24 with dizziness.  +orthostatics on arrival, diagnosed with vertigo.  MRI negative for CVA.
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
## 2023-03-17 NOTE — Progress Notes (Signed)
   03/17/23 2000  BiPAP/CPAP/SIPAP  Reason BIPAP/CPAP not in use Non-compliant

## 2023-03-17 NOTE — Progress Notes (Signed)
Received patient in bed to unit.  Alert and oriented.  Informed consent signed and in chart.   TX duration:3.5  Patient tolerated well.  Transported back to the room  Alert, without acute distress.  Hand-off given to patient's nurse.   Access used: right [facility] Access issues: none  Total UF removed: Medication(s) given: none   03/17/23 1129  Vitals  BP (!) 90/53  MAP (mmHg) (!) 63  BP Location Right Wrist  BP Method Automatic  Patient Position (if appropriate) Lying  Pulse Rate 74  Pulse Rate Source Monitor  ECG Heart Rate 99  Resp 16  Oxygen Therapy  SpO2 100 %  O2 Device Room Air  During Treatment Monitoring  HD Safety Checks Performed Yes  Intra-Hemodialysis Comments Tx completed  Dialysis Fluid Bolus Normal Saline  Bolus Amount (mL) 400 mL      Stephenson Cichy [facility]

## 2023-03-18 DIAGNOSIS — R42 Dizziness and giddiness: Secondary | ICD-10-CM | POA: Diagnosis not present

## 2023-03-18 LAB — CBC WITH DIFFERENTIAL/PLATELET
Abs Immature Granulocytes: 0.02 10*3/uL (ref 0.00–0.07)
Basophils Absolute: 0 10*3/uL (ref 0.0–0.1)
Basophils Relative: 0 %
Eosinophils Absolute: 0.1 10*3/uL (ref 0.0–0.5)
Eosinophils Relative: 3 %
HCT: 30.5 % — ABNORMAL LOW (ref 36.0–46.0)
Hemoglobin: 9.9 g/dL — ABNORMAL LOW (ref 12.0–15.0)
Immature Granulocytes: 1 %
Lymphocytes Relative: 16 %
Lymphs Abs: 0.7 10*3/uL (ref 0.7–4.0)
MCH: 31.9 pg (ref 26.0–34.0)
MCHC: 32.5 g/dL (ref 30.0–36.0)
MCV: 98.4 fL (ref 80.0–100.0)
Monocytes Absolute: 0.8 10*3/uL (ref 0.1–1.0)
Monocytes Relative: 19 %
Neutro Abs: 2.7 10*3/uL (ref 1.7–7.7)
Neutrophils Relative %: 61 %
Platelets: 112 10*3/uL — ABNORMAL LOW (ref 150–400)
RBC: 3.1 MIL/uL — ABNORMAL LOW (ref 3.87–5.11)
RDW: 14.9 % (ref 11.5–15.5)
WBC: 4.4 10*3/uL (ref 4.0–10.5)
nRBC: 0 % (ref 0.0–0.2)

## 2023-03-18 LAB — BASIC METABOLIC PANEL
Anion gap: 13 (ref 5–15)
BUN: 30 mg/dL — ABNORMAL HIGH (ref 8–23)
CO2: 27 mmol/L (ref 22–32)
Calcium: 9.4 mg/dL (ref 8.9–10.3)
Chloride: 94 mmol/L — ABNORMAL LOW (ref 98–111)
Creatinine, Ser: 6.69 mg/dL — ABNORMAL HIGH (ref 0.44–1.00)
GFR, Estimated: 6 mL/min — ABNORMAL LOW (ref >60)
Glucose, Bld: 127 mg/dL — ABNORMAL HIGH (ref 70–99)
Potassium: 4.3 mmol/L (ref 3.5–5.1)
Sodium: 134 mmol/L — ABNORMAL LOW (ref 135–145)

## 2023-03-18 LAB — GLUCOSE, CAPILLARY
Glucose-Capillary: 100 mg/dL — ABNORMAL HIGH (ref 70–99)
Glucose-Capillary: 120 mg/dL — ABNORMAL HIGH (ref 70–99)

## 2023-03-18 MED ORDER — AMIODARONE HCL 200 MG PO TABS: 200.0000 mg | ORAL_TABLET | Freq: Every day | ORAL | 3 refills | Status: DC

## 2023-03-18 MED ORDER — MECLIZINE HCL 25 MG PO TABS: 25.0000 mg | ORAL_TABLET | Freq: Three times a day (TID) | ORAL | 0 refills | Status: DC | PRN

## 2023-03-18 MED ORDER — MIDODRINE HCL 10 MG PO TABS: 10.0000 mg | ORAL_TABLET | Freq: Every day | ORAL | 1 refills | Status: DC

## 2023-03-18 NOTE — Progress Notes (Signed)
Discharge instructions given. No concerns voiced. Pt left unit in wheelchair pushed by Chartiga, NT accompanied by husband and grandson. Left in stable condition.

## 2023-03-18 NOTE — Progress Notes (Signed)
  [facility] Progress Note   Subjective:   Seen in room earlier this AM - feels well, hoping for discharge.  Objective Vitals:   03/17/23 1709 03/17/23 2021 03/18/23 0656 03/18/23 0911  BP: (!) 110/55 107/70 98/74 (!) 92/57  Pulse: 91 (!) 108 98 (!) 101  Resp: 18 16  18   Temp: 98.2 F (36.8 C) 98.1 F (36.7 C) 98.1 F (36.7 C) 98 F (36.7 C)  TempSrc:  Oral Oral   SpO2: 95% 100% 100% 98%  Weight:      Height:       Physical Exam General: Well appearing woman, NAD. Room air. Heart: Irregularly irregular, no murmur Lungs: CTA anteriorly Abdomen: soft Extremities: no LE edema Dialysis Access:  LUE AVF + bruit, TDC in R chest  Additional Objective Labs: Basic Metabolic Panel: Recent Labs  Lab 03/16/23 0304 03/17/23 0219 03/18/23 0826  NA 133* 133* 134*  K 4.1 4.4 4.3  CL 93* 93* 94*  CO2 28 24 27   GLUCOSE 120* 115* 127*  BUN 29* 46* 30*  CREATININE 6.49* 8.60* 6.69*  CALCIUM 9.0 9.2 9.4  PHOS 4.6  --   --    CBC: Recent Labs  Lab 03/14/23 1210 03/15/23 0045 03/16/23 0304 03/17/23 0219 03/18/23 0826  WBC 5.9 6.9 5.4 5.5 4.4  NEUTROABS  --   --   --   --  2.7  HGB 10.7* 10.8* 10.5* 9.4* 9.9*  HCT 34.3* 33.9* 34.3* 28.7* 30.5*  MCV 100.0 99.1 104.9* 94.1 98.4  PLT 148* 146* 125* 111* 112*   Medications:   amiodarone  200 mg Oral Daily   apixaban  5 mg Oral BID   busPIRone  5 mg Oral BID   Chlorhexidine Gluconate Cloth  6 each Topical Q0600   colchicine  0.3 mg Oral Daily   DULoxetine  60 mg Oral Daily    HYDROmorphone (DILAUDID) injection  0.5-1 mg Intravenous Once in dialysis   insulin aspart  0-5 Units Subcutaneous QHS   insulin aspart  0-6 Units Subcutaneous TID WC   loratadine  10 mg Oral Daily   midodrine  10 mg Oral Q T,Th,Sa-HD   pantoprazole  40 mg Oral Daily   rosuvastatin  10 mg Oral Daily   sodium chloride flush  3 mL Intravenous Q12H    Dialysis Orders: TTS at [facility] 4hr, 400/600, EDW 125.2kg, 3K/2.5Ca, TDC + AVF,  heparin 2500 unit bolus - Hectoral IV q HD - Mircera IV q 2 weeks (last 7/16, due 7/30) - Venofer 50mg  IV weekly   Assessment/Plan: Lightheadedness/L sided numbness/?vertigo: Head CT and brain MRI without acute findings. Initially with + orthostatic hypotension, torsemide on hold, midodrine added pre-HD ESRD: Continue HD on usual TTS schedule - next 7/30. Offered to raise EDW but she wants to keep same. HypoTN/volume: BP low here, metoprolol d/c'd -  no other BP meds. No edema. Secondary hyperparathyroidism: Ca ok, Phos pending.  Atrial fibrillation: On amiodarone, Eliquis. BB stopped. TSH good. T2DM: On SSI insulin, per primary.  Ozzie Hoyle, PA-C 03/18/2023, 2:08 PM   [facility]

## 2023-03-18 NOTE — TOC Transition Note (Signed)
Transition of Care [facility]) - CM/SW Discharge Note   Patient Details  Name: [name] MRN: [id number] Date of Birth: November 28, 1955  Transition of Care [facility] CM/SW Contact:  Ronny Bacon, RN Phone Number: 03/18/2023, 12:54 PM   Clinical Narrative:   Patient is being discharged home today. Spoke with patient by phone, confirmed that she has her DME equipment at bedside. Notified patient that her outpatient therapy service information is on her AVS. Patient confirms she has a ride home.    Final next level of care: Home/Self Care Barriers to Discharge: No Barriers Identified   Patient Goals and CMS Choice CMS Medicare.gov Compare Post Acute Care list provided to:: Patient Choice offered to / list presented to : Patient, Spouse, Adult Children  Discharge Placement                         Discharge Plan and Services Additional resources added to the After Visit Summary for     Discharge Planning Services: CM Consult Post Acute Care Choice:  (Outpatient Vestibular Rehab)          DME Arranged: Bedside commode DME Agency: [employer] Date DME Agency Contacted: 03/16/23 Time DME Agency Contacted: 1520 Representative spoke with at DME Agency: Earna Coder HH Arranged: NA HH Agency: NA        Social Determinants of Health (SDOH) Interventions SDOH Screenings   Food Insecurity: No Food Insecurity (03/16/2023)  Housing: Medium Risk (03/31/2022)  Transportation Needs: No Transportation Needs (03/16/2023)  Alcohol Screen: Low Risk  (03/31/2022)  Depression (PHQ2-9): Medium Risk (11/15/2022)  Financial Resource Strain: Low Risk  (03/31/2022)  Physical Activity: Sufficiently Active (03/31/2022)  Social Connections: Socially Isolated (03/31/2022)  Stress: Stress Concern Present (03/31/2022)  Tobacco Use: Low Risk  (03/14/2023)     Readmission Risk Interventions    03/16/2023    3:57 PM 01/20/2022    5:06 PM 08/31/2021    1:49 PM  Readmission Risk Prevention Plan   Transportation Screening Complete Complete Complete  Medication Review (RN Care Manager) Referral to Pharmacy  Referral to Pharmacy  PCP or Specialist appointment within 3-5 days of discharge Complete Complete Complete  HRI or Home Care Consult Complete  Complete  SW Recovery Care/Counseling Consult Complete Complete Complete  Palliative Care Screening Not Applicable Not Applicable Not Applicable  Skilled Nursing Facility Not Applicable Not Applicable Not Applicable

## 2023-03-18 NOTE — Plan of Care (Signed)
[facility] Patient Discharge Orders - [facility] CLINIC: [facility]  Patient's name: [name] Admit/DC Dates: 03/14/2023 - 03/18/2023  DISCHARGE DIAGNOSES: Vertigo   Stroke like symptoms -> negative work-up A-fib  HD ORDER CHANGES: Heparin change: no EDW Change: no Bath Change: no  ANEMIA MANAGEMENT: Aranesp: Given: no ESA dose for discharge: mircera 60 mcg IV q 2 weeks, to start on 7/30 IV Iron dose at discharge: no  BONE/MINERAL MEDICATIONS: Hectorol/Calcitriol change: no Sensipar/Parsabiv change: no  ACCESS INTERVENTION/CHANGE: no Details:   RECENT LABS: Recent Labs  Lab 03/16/23 0304 03/17/23 0219 03/18/23 0826  HGB 10.5*   < > 9.9*  NA 133*   < > 134*  K 4.1   < > 4.3  CALCIUM 9.0   < > 9.4  PHOS 4.6  --   --    < > = values in this interval not displayed.    IV ANTIBIOTICS: no Details:  OTHER ANTICOAGULATION: On Coumadin?: no   OTHER/APPTS/LAB ORDERS:   D/C Meds to be reconciled by nurse after every discharge.  Completed By: Ozzie Hoyle, PA-C [facility] Pager [phone]   Reviewed by: MD:______ RN_______

## 2023-03-18 NOTE — Discharge Summary (Addendum)
Physician Discharge Summary   Patient: [name] MRN: [id number] DOB: 21-Jan-1956  Admit date:     03/14/2023  Discharge date: 03/18/23  Discharge Physician: Jonah Blue   PCP: Grayce Sessions, NP   Recommendations at discharge:   Follow up at dialysis Tuesday as scheduled Stop Toprol XL Take midodrine daily Follow up with PCP in 1-2 weeks Use rolling walker with ambulation Follow up for outpatient vestibular physical therapy  Discharge Diagnoses: Principal Problem:   Vertigo Active Problems:   AF (paroxysmal atrial fibrillation) (HCC)   COPD (chronic obstructive pulmonary disease) (HCC)   Type 2 diabetes mellitus with hyperlipidemia (HCC)   QT prolongation   ESRD on dialysis (HCC)   OSA on CPAP   Orthostatic hypotension    Hospital Course: 66yo with h/o DM, HTN, COPD, HFpEF, OSA on CPAP, afib on Eliquis, and ESRD on TTS HD who presented on 7/24 with dizziness.  +orthostatics on arrival, diagnosed with vertigo.  MRI negative for CVA.    Assessment and Plan:  Vertigo Positive orthostatics upon arrival, no longer orthostatic.   Patient history of ESRD on dialysis for the last year, so unlikely dialysis dysequilibrium  Left-sided numbness is improving, MRI negative for stroke PT/OT consulting, recommend outpatient vesitbular PT Admitted to telemetry -> med surg Will add midodrine on HD days Will stop Toprol XL DC to home   ESRD on HD Patient on chronic TTS HD Nephrology prn order set utilized HD on 7/25 and 7/27   Atrial fibrillation  Rate controlled with amiodarone Stop Toprol XL Continue Eliquis   COPD Not in exacerbation on admission  Continue albuterol prn   OSA Continue CPAP     Type II DM  A1c was 6.7% in April 2023 Resume Trulicity   Anxiety Continue buspirone, duloxetine   HLD Continue rosuvastatin   Prolonged QT Improved on repeat EKG  Obesity Body mass index is 43.54 kg/m.Marland Kitchen  Weight loss should be  encouraged Outpatient PCP/bariatric medicine/bariatric surgery f/u encouraged    Consultants: Nephrology PT   Procedures: HD on 7/25, 7/27   30 Day Unplanned Readmission Risk Score     Flowsheet Row ED to Hosp-Admission (Current) from 03/14/2023 in [facility] 29M KIDNEY UNIT  30 Day Unplanned Readmission Risk Score (%) 38.01 Filed at 03/18/2023 0401           This score is the patient's risk of an unplanned readmission within 30 days of being discharged (0 -100%). The score is based on dignosis, age, lab data, medications, orders, and past utilization.   Low:  0-14.9   Medium: 15-21.9   High: 22-29.9   Extreme: 30 and above       Pain control - [place] Controlled Substance Reporting System database was reviewed. and patient was instructed, not to drive, operate heavy machinery, perform activities at heights, swimming or participation in water activities or provide baby-sitting services while on Pain, Sleep and Anxiety Medications; until their outpatient Physician has advised to do so again. Also recommended to not to take more than prescribed Pain, Sleep and Anxiety Medications.    Disposition: Home Diet recommendation:  Renal diet DISCHARGE MEDICATION: Allergies as of 03/18/2023       Reactions   Bee Pollen Anaphylaxis, Other (See Comments)   UNCONFIRMED, but IS allergic to bee VENOM   Bee Venom Anaphylaxis   Sulfa Antibiotics Itching   Iodinated Contrast Media Nausea And Vomiting        Medication List  STOP taking these medications    metoprolol succinate 25 MG 24 hr tablet Commonly known as: TOPROL-XL   promethazine 12.5 MG tablet Commonly known as: PHENERGAN       TAKE these medications    Accu-Chek Guide test strip Generic drug: glucose blood USE TO CHECK BLOOD SUGAR 3 TIMES DAILY   Accu-Chek Guide w/Device Kit Use to check blood sugar TID. Dx: E11.69 What changed: additional instructions   acetaminophen 325 MG tablet Commonly  known as: TYLENOL Take 2 tablets (650 mg total) by mouth every 6 (six) hours as needed for mild pain or moderate pain.   albuterol 108 (90 Base) MCG/ACT inhaler Commonly known as: Proventil HFA Inhale 1-2 puffs into the lungs every 6 (six) hours as needed for wheezing or shortness of breath.   amiodarone 200 MG tablet Commonly known as: PACERONE Take 1 tablet (200 mg total) by mouth daily.   apixaban 5 MG Tabs tablet Commonly known as: Eliquis Take 1 tablet (5 mg total) by mouth 2 (two) times daily.   busPIRone 5 MG tablet Commonly known as: BUSPAR TAKE ONE TABLET BY MOUTH TWICE DAILY @ 9AM & 5PM What changed: See the new instructions.   colchicine 0.6 MG tablet Take 1/2 (one-half) tablet by mouth once daily   diphenhydrAMINE 25 mg capsule Commonly known as: BENADRYL Take 25 mg by mouth every 6 (six) hours as needed for allergies or itching.   DULoxetine 60 MG capsule Commonly known as: CYMBALTA TAKE ONE CAPSULE (60MG  TOTAL) BY MOUTH DAILY AT 9AM What changed: See the new instructions.   EPINEPHrine 0.3 mg/0.3 mL Soaj injection Commonly known as: EPI-PEN Inject 0.3 mg into the muscle as needed for anaphylaxis.   fluticasone 50 MCG/ACT nasal spray Commonly known as: FLONASE Place 2 sprays into both nostrils daily as needed for allergies or rhinitis.   insulin lispro 100 UNIT/ML KwikPen Commonly known as: HumaLOG KwikPen Inject 10 Units into the skin 3 (three) times daily. What changed:  how much to take additional instructions   lidocaine-prilocaine cream Commonly known as: EMLA Apply 1 Application topically Every Tuesday,Thursday,and Saturday with dialysis.   loratadine 10 MG tablet Commonly known as: CLARITIN TAKE ONE TABLET BY MOUTH DAILY AT 9AM What changed: See the new instructions.   meclizine 25 MG tablet Commonly known as: ANTIVERT Take 1 tablet (25 mg total) by mouth 3 (three) times daily as needed for dizziness.   midodrine 10 MG tablet Commonly  known as: PROAMATINE Take 1 tablet (10 mg total) by mouth daily.   MULTIVITAMIN WOMEN PO Take 1 tablet by mouth daily.   nitroGLYCERIN 0.4 MG SL tablet Commonly known as: NITROSTAT Place 1 tablet (0.4 mg total) under the tongue every 5 (five) minutes x 3 doses as needed for chest pain.   ondansetron 4 MG tablet Commonly known as: ZOFRAN Take 1 tablet (4 mg total) by mouth every 6 (six) hours.   pantoprazole 40 MG tablet Commonly known as: PROTONIX TAKE ONE TABLET BY MOUTH DAILY AT 9AM What changed: See the new instructions.   rosuvastatin 10 MG tablet Commonly known as: CRESTOR TAKE ONE TABLET BY MOUTH DAILY AT 5PM What changed: See the new instructions.   torsemide 100 MG tablet Commonly known as: DEMADEX Take 100 mg by mouth See admin instructions.  Take Sunday,Monday, Wednesday and Friday   Trulicity 1.5 MG/0.5ML Sopn Generic drug: Dulaglutide Inject 1.5 mg into the skin once a week.  Durable Medical Equipment  (From admission, onward)           Start     Ordered   03/16/23 1544  For home use only DME 4 wheeled rolling walker with seat  Once       Question:  Patient needs a walker to treat with the following condition  Answer:  Gait instability   03/16/23 1544            Follow-up Information     [facility] Follow up.   Specialty: Rehabilitation Why: Call to schedule first Vestibular appointment Contact information: [address] [place], [address] [place] [address] [phone]               Discharge Exam: Ceasar Mons Weights   03/15/23 1940 03/17/23 0743 03/17/23 1149  Weight: 125.5 kg 126.8 kg 126.1 kg   Subjective: She feels much better today, no complaints, wants to go home.     Objective:     Vitals:    03/17/23 2021 03/18/23 0656  BP: 107/70 98/74  Pulse: (!) 108 98  Resp: 16    Temp: 98.1 F (36.7 C) 98.1 F (36.7 C)  SpO2: 100% 100%      Intake/Output  Summary (Last 24 hours) at 03/18/2023 0801 Last data filed at 03/17/2023 2021    Gross per 24 hour  Intake --  Output 500 ml  Net -500 ml         Filed Weights    03/15/23 1940 03/17/23 0743 03/17/23 1149  Weight: 125.5 kg 126.8 kg 126.1 kg      Exam:   General:  Appears calm and comfortable and is in NAD Eyes:  EOMI, normal lids, iris ENT:  grossly normal hearing, lips & tongue, mmm Neck:  no LAD, masses or thyromegaly Cardiovascular:  Irregularly irregular without tachycardia. No LE edema.  Respiratory:   CTA bilaterally with no wheezes/rales/rhonchi.  Normal respiratory effort. Abdomen:  soft, NT, ND Skin:  no rash or induration seen on limited exam Musculoskeletal:  grossly normal tone BUE/BLE, good ROM, no bony abnormality Psychiatric:  grossly normal mood and affect, speech fluent and appropriate, AOx3 Neurologic:  CN 2-12 grossly intact, moves all extremities in coordinated fashion    Data Reviewed: I have reviewed the patient's lab results since admission.  Pertinent labs for today include:    Glucose 127 BUN 30/Creatinine 6.69/GFR 6 WBC 4.4 Hgb 9.9, 9.4 on 7/27 Platelets 112    Condition at discharge: stable  The results of significant diagnostics from this hospitalization (including imaging, microbiology, ancillary and laboratory) are listed below for reference.   Imaging Studies: MR BRAIN WO CONTRAST  Result Date: 03/15/2023 CLINICAL DATA:  Initial evaluation for neuro deficit, stroke suspected. EXAM: MRI HEAD WITHOUT CONTRAST TECHNIQUE: Multiplanar, multiecho pulse sequences of the brain and surrounding structures were obtained without intravenous contrast. COMPARISON:  Prior CT from 03/14/2023. FINDINGS: Brain: Examination mildly degraded by motion artifact. Cerebral volume within normal limits. Scattered patchy T2/FLAIR hyperintensity involving the periventricular deep white matter both cerebral hemispheres, most like related chronic microvascular ischemic  disease, mild for age. No evidence for acute or subacute ischemia. Gray-white matter differentiation maintained. No areas of chronic cortical infarction. No acute intracranial hemorrhage. Few punctate chronic micro hemorrhages noted within the right cerebral hemisphere with a likely small vessel related. No mass lesion, midline shift or mass effect. No hydrocephalus or extra-axial fluid collection. Pituitary gland suprasellar region within normal limits. Vascular: Major  intracranial vascular flow voids are maintained. Skull and upper cervical spine: Cranial junction with normal limits. Decreased T1 signal intensity seen within the bone marrow the visualized upper spine, nonspecific, but most commonly related to anemia, smoking or obesity. No scalp soft tissue abnormality. Sinuses/Orbits: Prior bilateral ocular lens replacement. Mild bilateral exophthalmos. Paranasal sinuses are clear. Trace bilateral mastoid effusions, of doubtful significance. Visualized nasopharynx unremarkable. Other: None. IMPRESSION: 1. No acute intracranial abnormality. 2. Mild chronic microvascular ischemic disease for age. Electronically Signed   By: Rise Mu M.D.   On: 03/15/2023 02:00   CT Head Wo Contrast  Result Date: 03/14/2023 CLINICAL DATA:  Headache, neuro deficit EXAM: CT HEAD WITHOUT CONTRAST TECHNIQUE: Contiguous axial images were obtained from the base of the skull through the vertex without intravenous contrast. RADIATION DOSE REDUCTION: This exam was performed according to the departmental dose-optimization program which includes automated exposure control, adjustment of the mA and/or kV according to patient size and/or use of iterative reconstruction technique. COMPARISON:  MRI and CT scan head from 06/28/2022. FINDINGS: Brain: No evidence of acute infarction, hemorrhage, hydrocephalus, extra-axial collection or mass lesion/mass effect. There is bilateral periventricular hypodensity, which is non-specific but  most likely seen in the settings of microvascular ischemic changes. Mild in extent. Vascular: No hyperdense vessel or unexpected calcification. Skull: Normal. Negative for fracture or focal lesion. Note is made of hyperostosis frontalis interna. Sinuses/Orbits: No acute finding. Other: None. IMPRESSION: 1. No acute intracranial abnormality. 2. Mild microvascular ischemic changes. Electronically Signed   By: Jules Schick M.D.   On: 03/14/2023 12:20   DG Chest Portable 1 View  Result Date: 03/14/2023 CLINICAL DATA:  shob EXAM: PORTABLE CHEST 1 VIEW COMPARISON:  02/24/2023. FINDINGS: Probable left basilar atelectatic changes. Bilateral lateral costophrenic angles are clear. Central pulmonary vascular congestion, which may be accentuated by low lung volume. Stable enlarged cardio-mediastinal silhouette. Sternotomy wires noted. There is probable left atrial appendage closure device. Right IJ hemodialysis catheter noted with its tip overlying the cavoatrial junction region. No acute osseous abnormalities. The soft tissues are within normal limits. IMPRESSION: 1. Cardiomegaly and central pulmonary vascular congestion. 2. Left basilar atelectatic changes. Electronically Signed   By: Jules Schick M.D.   On: 03/14/2023 12:17   CT ABDOMEN PELVIS WO CONTRAST  Result Date: 02/24/2023 CLINICAL DATA:  Abdominal pain, acute nonlocalized EXAM: CT ABDOMEN AND PELVIS WITHOUT CONTRAST TECHNIQUE: Multidetector CT imaging of the abdomen and pelvis was performed following the standard protocol without IV contrast. RADIATION DOSE REDUCTION: This exam was performed according to the departmental dose-optimization program which includes automated exposure control, adjustment of the mA and/or kV according to patient size and/or use of iterative reconstruction technique. COMPARISON:  CT 01/11/2023 FINDINGS: Lower chest: Lung bases are clear. Hepatobiliary: No focal hepatic lesion. Postcholecystectomy. No biliary duct dilatation.  Common bile duct is normal. Pancreas: Pancreas is normal. No ductal dilatation. No pancreatic inflammation. Spleen: Normal spleen Adrenals/urinary tract: Adrenal glands normal. Periphery calcified round vascular lesions in the LEFT renal hilum stable compared to prior. Ureters bladder normal. Stomach/Bowel: Stomach, small bowel, appendix, and cecum are normal. The colon and rectosigmoid colon are normal. Vascular/Lymphatic: Abdominal aorta is normal caliber with atherosclerotic calcification. There is no retroperitoneal or periportal lymphadenopathy. No pelvic lymphadenopathy. Reproductive: Prostate unremarkable Other: No free fluid. Musculoskeletal: No aggressive osseous lesion. IMPRESSION: 1. No acute findings in the abdomen pelvis. 2. Normal appendix. 3. Postcholecystectomy. 4. Stable peripherally calcified vascular lesions in the LEFT renal hilum. 5. Aortic atherosclerosis. Aortic Atherosclerosis (ICD10-I70.0).  Electronically Signed   By: Genevive Bi M.D.   On: 02/24/2023 16:34   DG Chest Port 1 View  Result Date: 02/24/2023 CLINICAL DATA:  Nausea and vomiting. EXAM: PORTABLE CHEST 1 VIEW COMPARISON:  01/11/2023 FINDINGS: Large-bore central venous line. Midline sternotomy. Atrial clip noted. Cardiac silhouette is enlarged similar prior. Low lung volumes. Central venous congestion. No pneumothorax or focal consolidation. Probable small LEFT effusion. IMPRESSION: 1. No significant change. 2. Cardiomegaly and LEFT effusion. Electronically Signed   By: Genevive Bi M.D.   On: 02/24/2023 15:32   VAS US DUPLEX DIALYSIS ACCESS (AVF, AVG)  Result Date: 02/21/2023 DIALYSIS ACCESS Patient Name:  LARAE BARAHONA BOYD  Date of Exam:   02/21/2023 Medical Rec #: [id number]               Accession #:    [id number] Date of Birth: September 15, 1955               Patient Gender: F Patient Age:   10 years Exam Location:  [facility] Procedure:      VAS US DUPLEX DIALYSIS ACCESS (AVF, AVG) Referring Phys:  Cristal Deer DICKSON --------------------------------------------------------------------------------  Reason for Exam: Routine follow up. Access Site: Left Upper Extremity. Access Type: Brachial-cephalic AVF. History: 03/17/2022 Creation of left brachiocephalic fistula          01/05/2023 Revision of left AVF with interposition PTFE graft. Comparison Study: 05/04/2022 Dialysis access duplex- Patent arteriovenous                   fistula. Performing Technologist: Gertie Fey MHA, RDMS, RVT, RDCS  Examination Guidelines: A complete evaluation includes B-mode imaging, spectral Doppler, color Doppler, and power Doppler as needed of all accessible portions of each vessel. Unilateral testing is considered an integral part of a complete examination. Limited examinations for reoccurring indications may be performed as noted.  Findings: +--------------------+----------+-----------------+-------------------+ AVF                 PSV (cm/s)Flow Vol (mL/min)     Comments       +--------------------+----------+-----------------+-------------------+ Native artery inflow   223          1186                           +--------------------+----------+-----------------+-------------------+ AVF Anastomosis        626                     Interposition graft +--------------------+----------+-----------------+-------------------+  +------------+----------+-------------+----------+-------------------+ OUTFLOW VEINPSV (cm/s)Diameter (cm)Depth (cm)     Describe       +------------+----------+-------------+----------+-------------------+ Shoulder        84        0.98        1.62                       +------------+----------+-------------+----------+-------------------+ Prox UA        145        1.23        0.86                       +------------+----------+-------------+----------+-------------------+ Mid UA         125        0.72        0.26                        +------------+----------+-------------+----------+-------------------+  Dist UA        136        0.79        0.23                       +------------+----------+-------------+----------+-------------------+ AC Fossa       346        1.04        0.49   Interposition graft +------------+----------+-------------+----------+-------------------+   Summary: Patent arteriovenous fistula. *See table(s) above for measurements and observations.  Diagnosing physician: Waverly Ferrari MD Electronically signed by Waverly Ferrari MD on 02/21/2023 at 2:08:19 PM.   --------------------------------------------------------------------------------   Final     Microbiology: Results for orders placed or performed during the hospital encounter of 10/23/22  Resp panel by RT-PCR (RSV, Flu A&B, Covid) Anterior Nasal Swab     Status: None   Collection Time: 10/23/22  1:45 PM   Specimen: Anterior Nasal Swab  Result Value Ref Range Status   SARS Coronavirus 2 by RT PCR NEGATIVE NEGATIVE Final    Comment: (NOTE) SARS-CoV-2 target nucleic acids are NOT DETECTED.  The SARS-CoV-2 RNA is generally detectable in upper respiratory specimens during the acute phase of infection. The lowest concentration of SARS-CoV-2 viral copies this assay can detect is 138 copies/mL. A negative result does not preclude SARS-Cov-2 infection and should not be used as the sole basis for treatment or other patient management decisions. A negative result may occur with  improper specimen collection/handling, submission of specimen other than nasopharyngeal swab, presence of viral mutation(s) within the areas targeted by this assay, and inadequate number of viral copies(<138 copies/mL). A negative result must be combined with clinical observations, patient history, and epidemiological information. The expected result is Negative.  Fact Sheet for Patients:  BloggerCourse.com  Fact Sheet for Healthcare  Providers:  SeriousBroker.it  This test is no t yet approved or cleared by the [place] FDA and  has been authorized for detection and/or diagnosis of SARS-CoV-2 by FDA under an Emergency Use Authorization (EUA). This EUA will remain  in effect (meaning this test can be used) for the duration of the COVID-19 declaration under Section 564(b)(1) of the Act, 21 U.S.C.section 360bbb-3(b)(1), unless the authorization is terminated  or revoked sooner.       Influenza A by PCR NEGATIVE NEGATIVE Final   Influenza B by PCR NEGATIVE NEGATIVE Final    Comment: (NOTE) The Xpert Xpress SARS-CoV-2/FLU/RSV plus assay is intended as an aid in the diagnosis of influenza from Nasopharyngeal swab specimens and should not be used as a sole basis for treatment. Nasal washings and aspirates are unacceptable for Xpert Xpress SARS-CoV-2/FLU/RSV testing.  Fact Sheet for Patients: BloggerCourse.com  Fact Sheet for Healthcare Providers: SeriousBroker.it  This test is not yet approved or cleared by the [place] FDA and has been authorized for detection and/or diagnosis of SARS-CoV-2 by FDA under an Emergency Use Authorization (EUA). This EUA will remain in effect (meaning this test can be used) for the duration of the COVID-19 declaration under Section 564(b)(1) of the Act, 21 U.S.C. section 360bbb-3(b)(1), unless the authorization is terminated or revoked.     Resp Syncytial Virus by PCR NEGATIVE NEGATIVE Final    Comment: (NOTE) Fact Sheet for Patients: BloggerCourse.com  Fact Sheet for Healthcare Providers: SeriousBroker.it  This test is not yet approved or cleared by the [place] FDA and has been authorized for detection and/or diagnosis of SARS-CoV-2 by FDA under an Emergency Use Authorization (  EUA). This EUA will remain in effect (meaning this test can be  used) for the duration of the COVID-19 declaration under Section 564(b)(1) of the Act, 21 U.S.C. section 360bbb-3(b)(1), unless the authorization is terminated or revoked.  Performed at [facility], [address], [place], [place] [address]    *Note: Due to a large number of results and/or encounters for the requested time period, some results have not been displayed. A complete set of results can be found in Results Review.    Labs: CBC: Recent Labs  Lab 03/14/23 1210 03/15/23 0045 03/16/23 0304 03/17/23 0219 03/18/23 0826  WBC 5.9 6.9 5.4 5.5 4.4  NEUTROABS  --   --   --   --  2.7  HGB 10.7* 10.8* 10.5* 9.4* 9.9*  HCT 34.3* 33.9* 34.3* 28.7* 30.5*  MCV 100.0 99.1 104.9* 94.1 98.4  PLT 148* 146* 125* 111* 112*   Basic Metabolic Panel: Recent Labs  Lab 03/14/23 1210 03/15/23 0045 03/16/23 0304 03/17/23 0219 03/18/23 0826  NA 138 132* 133* 133* 134*  K 4.7 4.3 4.1 4.4 4.3  CL 96* 91* 93* 93* 94*  CO2 28 26 28 24 27   GLUCOSE 124* 99 120* 115* 127*  BUN 27* 35* 29* 46* 30*  CREATININE 7.48* 8.64* 6.49* 8.60* 6.69*  CALCIUM 9.4 9.4 9.0 9.2 9.4  PHOS  --   --  4.6  --   --     CBG: Recent Labs  Lab 03/17/23 1641 03/17/23 2023 03/17/23 2246 03/18/23 0732 03/18/23 1122  GLUCAP 87 107* 87 100* 120*    Discharge time spent: greater than 30 minutes.  Signed: Jonah Blue, MD [facility] 03/18/2023

## 2023-03-19 ENCOUNTER — Telehealth (INDEPENDENT_AMBULATORY_CARE_PROVIDER_SITE_OTHER): Payer: Self-pay

## 2023-03-19 NOTE — Progress Notes (Signed)
Late Note Entry- March 19, 2023  Pt was d/c to home yesterday. Contacted [facility] this morning to advise clinic of pt's d/c date and that pt should resume care tomorrow.   Olivia Canter Renal Navigator [phone]

## 2023-03-19 NOTE — Transitions of Care (Post Inpatient/ED Visit) (Signed)
03/19/2023  Name: [name] MRN: [id number] DOB: [birth year]  Today's TOC FU Call Status: Today's TOC FU Call Status:: Successful TOC FU Call Competed TOC FU Call Complete Date: 03/19/23  Transition Care Management Follow-up Telephone Call Date of Discharge: 03/18/23 Discharge Facility: [facility]) Type of Discharge: Inpatient Admission Primary Inpatient Discharge Diagnosis:: Vertigo How have you been since you were released from the hospital?: Better Any questions or concerns?: No  Items Reviewed: Did you receive and understand the discharge instructions provided?: Yes Medications obtained,verified, and reconciled?: Yes (Medications Reviewed) Any new allergies since your discharge?: No Dietary orders reviewed?: Yes Do you have support at home?: Yes  Medications Reviewed Today: Medications Reviewed Today     Reviewed by Merleen Nicely, LPN (Licensed Practical Nurse) on 03/19/23 at 1209  Med List Status: <None>   Medication Order Taking? Sig Documenting Provider Last Dose Status Informant  acetaminophen (TYLENOL) 325 MG tablet [id number] Yes Take 2 tablets (650 mg total) by mouth every 6 (six) hours as needed for mild pain or moderate pain. Arrien, York Ram, MD Taking Active Self, Pharmacy Records  albuterol [facility]) 108 [id number] Base) MCG/ACT inhaler [id number] Yes Inhale 1-2 puffs into the lungs every 6 (six) hours as needed for wheezing or shortness of breath. Grayce Sessions, NP Taking Active Self, Pharmacy Records  amiodarone (PACERONE) 200 MG tablet [id number] Yes Take 1 tablet (200 mg total) by mouth daily. Jonah Blue, MD Taking Active   apixaban (ELIQUIS) 5 MG TABS tablet [id number] Yes Take 1 tablet (5 mg total) by mouth 2 (two) times daily. Bensimhon, Bevelyn Buckles, MD Taking Active Self, Pharmacy Records  Blood Glucose Monitoring Suppl (ACCU-CHEK GUIDE) w/Device KIT [id number] Yes Use to check blood sugar TID. Dx: E11.69  Patient taking  differently: Use to check blood sugar TID. Dx: E11.69 Last reading: 105, 02-20-2023, Fasting.   Hoy Register, MD Taking Active Self, Pharmacy Records  busPIRone (BUSPAR) 5 MG tablet [id number] Yes TAKE ONE TABLET BY MOUTH TWICE DAILY @ 9AM & 5PM  Patient taking differently: Take 5 mg by mouth 2 (two) times daily. Take 1 tablet by mouth at 9 am, 5 pm.   Grayce Sessions, NP Taking Active Self, Pharmacy Records  colchicine 0.6 MG tablet [id number] Yes Take 1/2 (one-half) tablet by mouth once daily Hoy Register, MD Taking Active Self, Pharmacy Records  diphenhydrAMINE (BENADRYL) 25 mg capsule [id number] Yes Take 25 mg by mouth every 6 (six) hours as needed for allergies or itching. [provider] Taking Active Self, Pharmacy Records           Med Note Lenoria Farrier   Wed Jan 03, 2023  2:38 PM)    Dulaglutide (TRULICITY) 1.5 MG/0.5ML SOPN [id number] Yes Inject 1.5 mg into the skin once a week. Grayce Sessions, NP Taking Active Self, Pharmacy Records  DULoxetine (CYMBALTA) 60 MG capsule [id number] Yes TAKE ONE CAPSULE (60MG  TOTAL) BY MOUTH DAILY AT 9AM  Patient taking differently: Take 60 mg by mouth in the morning.   Grayce Sessions, NP Taking Active Self, Pharmacy Records  EPINEPHrine 0.3 mg/0.3 mL IJ SOAJ injection [id number] Yes Inject 0.3 mg into the muscle as needed for anaphylaxis. Grayce Sessions, NP Taking Active Self, Pharmacy Records           Med Note Penni Bombard   Tue Jun 20, 2022  1:53 PM) Has on hand  fluticasone (FLONASE) 50 MCG/ACT nasal spray [id number] Yes Place 2 sprays into both  nostrils daily as needed for allergies or rhinitis. Grayce Sessions, NP Taking Active Self, Pharmacy Records  glucose blood (ACCU-CHEK GUIDE) test strip [id number] Yes USE TO CHECK BLOOD SUGAR 3 TIMES DAILY Hoy Register, MD Taking Active Self, Pharmacy Records  insulin lispro (HUMALOG KWIKPEN) 100 UNIT/ML KwikPen [id number] Yes Inject 10 Units into the skin 3  (three) times daily.  Patient taking differently: Inject 5-10 Units into the skin 3 (three) times daily. Sling scale 105= 1-2 units 106=   Grayce Sessions, NP Taking Active Self, Pharmacy Records  lidocaine-prilocaine (EMLA) cream [id number] Yes Apply 1 Application topically Every Tuesday,Thursday,and Saturday with dialysis. [provider] Taking Active Self, Pharmacy Records  loratadine (CLARITIN) 10 MG tablet [id number] Yes TAKE ONE TABLET BY MOUTH DAILY AT 9AM  Patient taking differently: Take 10 mg by mouth daily.   Grayce Sessions, NP Taking Active Self, Pharmacy Records  meclizine (ANTIVERT) 25 MG tablet [id number] Yes Take 1 tablet (25 mg total) by mouth 3 (three) times daily as needed for dizziness. Jonah Blue, MD Taking Active   midodrine (PROAMATINE) 10 MG tablet [id number] Yes Take 1 tablet (10 mg total) by mouth daily. Jonah Blue, MD Taking Active   Multiple Vitamins-Minerals (MULTIVITAMIN WOMEN PO) [id number] Yes Take 1 tablet by mouth daily. [provider] Taking Active Self, Pharmacy Records  nitroGLYCERIN (NITROSTAT) 0.4 MG SL tablet [id number] Yes Place 1 tablet (0.4 mg total) under the tongue every 5 (five) minutes x 3 doses as needed for chest pain. Rinaldo Cloud, MD Taking Active Self, Pharmacy Records           Med Note Penni Bombard   Tue Jun 20, 2022  1:56 PM) prn  ondansetron (ZOFRAN) 4 MG tablet [id number] Yes Take 1 tablet (4 mg total) by mouth every 6 (six) hours. Wynetta Fines, MD Taking Active Self, Pharmacy Records  pantoprazole (PROTONIX) 40 MG tablet [id number] Yes TAKE ONE TABLET BY MOUTH DAILY AT 9AM  Patient taking differently: Take 40 mg by mouth daily. 9am   Grayce Sessions, NP Taking Active Self, Pharmacy Records  rosuvastatin (CRESTOR) 10 MG tablet [id number] Yes TAKE ONE TABLET BY MOUTH DAILY AT 5PM  Patient taking differently: Take 10 mg by mouth daily. 5 pm   Bensimhon, Bevelyn Buckles, MD Taking Active Self,  Pharmacy Records  torsemide [facility]) 100 MG tablet [id number] Yes Take 100 mg by mouth See admin instructions.  Take Sunday,Monday, Wednesday and Friday [provider] Taking Active Self, Pharmacy Records           Med Note Chestine Spore, [place]   Fri Jun 23, 2022  8:16 AM) 11/2 Dr Cristal Deer office called back and said that the Torsemide dose 100mg  MWF is correct  Follows up w/Dr. Teressa Lower  outpatient & Dr Cristal Deer per pt.  11/2 Neph PA noted Torsemide was MWFS at [facility] clinic but apparently dose has been changed per cardiology.    Med List Note (Card, Amy L, CPhT 03/15/23 0000): Dialysis: Tuesday, Thursday, and Saturday.             Home Care and Equipment/Supplies: Were Home Health Services Ordered?: No Any new equipment or medical supplies ordered?: No  Functional Questionnaire: Do you need assistance with bathing/showering or dressing?: No Do you need assistance with meal preparation?: No Do you need assistance with eating?: No Do you have difficulty maintaining continence: No Do you need assistance with getting out of bed/getting out of a chair/moving?: No Do you have difficulty  managing or taking your medications?: No  Follow up appointments reviewed: PCP Follow-up appointment confirmed?: No (pt needs appt for fu on a mon/ wed/ fri due to dialysis schedule- needs appt by 03-28-10-24- will ask front desk to call pt to schedule fu appt) MD Provider Line Number:[phone] Given: Yes Follow-up Provider: Gwinda Passe NP Specialist Hospital Follow-up appointment confirmed?: Yes Date of Specialist follow-up appointment?: 03/21/23 Follow-Up Specialty Provider:: Dr Cristal Deer Do you need transportation to your follow-up appointment?: No Do you understand care options if your condition(s) worsen?: Yes-patient verbalized understanding    SIGNATURE  Woodfin Ganja LPN [facility] Nurse Health Advisor Direct Dial [phone]

## 2023-03-21 ENCOUNTER — Ambulatory Visit (HOSPITAL_BASED_OUTPATIENT_CLINIC_OR_DEPARTMENT_OTHER): Payer: 59 | Admitting: Cardiology

## 2023-03-21 ENCOUNTER — Ambulatory Visit: Payer: 59 | Admitting: Podiatry

## 2023-03-21 VITALS — BP 110/61

## 2023-03-21 DIAGNOSIS — M79674 Pain in right toe(s): Secondary | ICD-10-CM

## 2023-03-21 DIAGNOSIS — M79675 Pain in left toe(s): Secondary | ICD-10-CM

## 2023-03-21 DIAGNOSIS — B351 Tinea unguium: Secondary | ICD-10-CM | POA: Diagnosis not present

## 2023-03-21 DIAGNOSIS — E1142 Type 2 diabetes mellitus with diabetic polyneuropathy: Secondary | ICD-10-CM | POA: Diagnosis not present

## 2023-03-22 ENCOUNTER — Other Ambulatory Visit (INDEPENDENT_AMBULATORY_CARE_PROVIDER_SITE_OTHER): Payer: Self-pay | Admitting: Family Medicine

## 2023-03-25 ENCOUNTER — Encounter: Payer: Self-pay | Admitting: Podiatry

## 2023-03-25 NOTE — Progress Notes (Signed)
  Subjective:  Patient ID: [name], female    DOB: 21-Mar-1956,  MRN: [id number]  Miangel Flom presents to clinic today for: at risk foot care with h/o NIDDM with ESRD on hemodialysis and painful thick toenails that are difficult to trim. Pain interferes with ambulation. Aggravating factors include wearing enclosed shoe gear. Pain is relieved with periodic professional debridement.  She is feeling better since her discharge from the hospital. She will be attending PT for vertigo. Chief Complaint  Patient presents with   Nail Problem    dfc    PCP is Grayce Sessions, NP.  Allergies  Allergen Reactions   Bee Pollen Anaphylaxis and Other (See Comments)    UNCONFIRMED, but IS allergic to bee VENOM   Bee Venom Anaphylaxis   Sulfa Antibiotics Itching   Iodinated Contrast Media Nausea And Vomiting    Review of Systems: Negative except as noted in the HPI.  Objective: No changes noted in today's physical examination. Vitals:   03/21/23 0952  BP: 110/61    [name] is a pleasant 67 y.o. female in NAD. AAO x 3.  Vascular Examination: Capillary refill time <3 seconds b/l LE. Palpable pedal pulses b/l LE. Digital hair sparse b/l.  Skin temperature gradient WNL b/l. No varicosities b/l. Trace edema noted bilateral ankles..  Dermatological Examination: Pedal skin with normal turgor, texture and tone b/l. No open wounds. No interdigital macerations b/l. Toenails 1-5 b/l thickened, discolored, dystrophic with subungual debris. There is pain on palpation to dorsal aspect of nailplates. No hyperkeratotic nor porokeratotic lesions present on today's visit.Marland Kitchen  Neurological Examination: Protective sensation intact with 10 gram monofilament b/l LE.   Musculoskeletal Examination: Normal muscle strength 5/5 to all lower extremity muscle groups bilaterally. HAV with bunion deformity noted b/l LE. Hammertoe deformity noted 2-5 b/l.Marland Kitchen No pain, crepitus or joint  limitation noted with ROM b/l LE.  Patient ambulates independently without assistive aids.    Latest Ref Rng & Units 03/15/2023   12:45 AM  Hemoglobin A1C  Hemoglobin-A1c 4.8 - 5.6 % 6.0    Assessment/Plan: 1. Pain due to onychomycosis of toenails of both feet   2. Diabetic peripheral neuropathy associated with type 2 diabetes mellitus (HCC)     -Consent given for treatment as described below: -Examined patient. -Patient to continue soft, supportive shoe gear daily. -Toenails 1-5 b/l were debrided in length and girth with sterile nail nippers and dremel without iatrogenic bleeding.  -Patient/POA to call should there be question/concern in the interim.   Return in about 3 months (around 06/21/2023).  Freddie Breech, DPM

## 2023-03-26 ENCOUNTER — Ambulatory Visit: Payer: 59 | Attending: Family Medicine

## 2023-03-27 ENCOUNTER — Inpatient Hospital Stay (HOSPITAL_COMMUNITY)
Admission: EM | Admit: 2023-03-27 | Discharge: 2023-03-30 | DRG: 177 | Disposition: A | Discharge status: Home / Self Care | Payer: 59 | Attending: Internal Medicine | Admitting: Internal Medicine

## 2023-03-27 ENCOUNTER — Emergency Department (HOSPITAL_COMMUNITY): Payer: 59

## 2023-03-27 ENCOUNTER — Other Ambulatory Visit: Payer: Self-pay

## 2023-03-27 ENCOUNTER — Encounter (HOSPITAL_COMMUNITY): Payer: Self-pay

## 2023-03-27 DIAGNOSIS — Z91041 Radiographic dye allergy status: Secondary | ICD-10-CM

## 2023-03-27 DIAGNOSIS — Z952 Presence of prosthetic heart valve: Secondary | ICD-10-CM | POA: Diagnosis not present

## 2023-03-27 DIAGNOSIS — Z992 Dependence on renal dialysis: Secondary | ICD-10-CM

## 2023-03-27 DIAGNOSIS — I4811 Longstanding persistent atrial fibrillation: Secondary | ICD-10-CM | POA: Diagnosis present

## 2023-03-27 DIAGNOSIS — I4891 Unspecified atrial fibrillation: Principal | ICD-10-CM

## 2023-03-27 DIAGNOSIS — E119 Type 2 diabetes mellitus without complications: Secondary | ICD-10-CM

## 2023-03-27 DIAGNOSIS — Z7901 Long term (current) use of anticoagulants: Secondary | ICD-10-CM

## 2023-03-27 DIAGNOSIS — Z7985 Long-term (current) use of injectable non-insulin antidiabetic drugs: Secondary | ICD-10-CM

## 2023-03-27 DIAGNOSIS — I5020 Unspecified systolic (congestive) heart failure: Secondary | ICD-10-CM | POA: Diagnosis not present

## 2023-03-27 DIAGNOSIS — Z79899 Other long term (current) drug therapy: Secondary | ICD-10-CM

## 2023-03-27 DIAGNOSIS — G4733 Obstructive sleep apnea (adult) (pediatric): Secondary | ICD-10-CM | POA: Diagnosis present

## 2023-03-27 DIAGNOSIS — F32A Depression, unspecified: Secondary | ICD-10-CM | POA: Diagnosis present

## 2023-03-27 DIAGNOSIS — F064 Anxiety disorder due to known physiological condition: Secondary | ICD-10-CM | POA: Diagnosis present

## 2023-03-27 DIAGNOSIS — K529 Noninfective gastroenteritis and colitis, unspecified: Secondary | ICD-10-CM

## 2023-03-27 DIAGNOSIS — K3184 Gastroparesis: Secondary | ICD-10-CM | POA: Diagnosis present

## 2023-03-27 DIAGNOSIS — E785 Hyperlipidemia, unspecified: Secondary | ICD-10-CM | POA: Diagnosis present

## 2023-03-27 DIAGNOSIS — U071 COVID-19: Secondary | ICD-10-CM | POA: Diagnosis not present

## 2023-03-27 DIAGNOSIS — J449 Chronic obstructive pulmonary disease, unspecified: Secondary | ICD-10-CM | POA: Diagnosis present

## 2023-03-27 DIAGNOSIS — A084 Viral intestinal infection, unspecified: Secondary | ICD-10-CM | POA: Diagnosis present

## 2023-03-27 DIAGNOSIS — I10 Essential (primary) hypertension: Secondary | ICD-10-CM | POA: Diagnosis not present

## 2023-03-27 DIAGNOSIS — I2510 Atherosclerotic heart disease of native coronary artery without angina pectoris: Secondary | ICD-10-CM | POA: Diagnosis present

## 2023-03-27 DIAGNOSIS — N186 End stage renal disease: Secondary | ICD-10-CM | POA: Diagnosis present

## 2023-03-27 DIAGNOSIS — I5032 Chronic diastolic (congestive) heart failure: Secondary | ICD-10-CM | POA: Diagnosis present

## 2023-03-27 DIAGNOSIS — E1143 Type 2 diabetes mellitus with diabetic autonomic (poly)neuropathy: Secondary | ICD-10-CM | POA: Diagnosis present

## 2023-03-27 DIAGNOSIS — G4700 Insomnia, unspecified: Secondary | ICD-10-CM | POA: Diagnosis present

## 2023-03-27 DIAGNOSIS — E1122 Type 2 diabetes mellitus with diabetic chronic kidney disease: Secondary | ICD-10-CM | POA: Diagnosis present

## 2023-03-27 DIAGNOSIS — I482 Chronic atrial fibrillation, unspecified: Secondary | ICD-10-CM | POA: Diagnosis not present

## 2023-03-27 DIAGNOSIS — M898X9 Other specified disorders of bone, unspecified site: Secondary | ICD-10-CM | POA: Diagnosis present

## 2023-03-27 DIAGNOSIS — D631 Anemia in chronic kidney disease: Secondary | ICD-10-CM | POA: Diagnosis present

## 2023-03-27 DIAGNOSIS — I132 Hypertensive heart and chronic kidney disease with heart failure and with stage 5 chronic kidney disease, or end stage renal disease: Secondary | ICD-10-CM | POA: Diagnosis present

## 2023-03-27 DIAGNOSIS — I5042 Chronic combined systolic (congestive) and diastolic (congestive) heart failure: Secondary | ICD-10-CM | POA: Diagnosis present

## 2023-03-27 DIAGNOSIS — Z8249 Family history of ischemic heart disease and other diseases of the circulatory system: Secondary | ICD-10-CM

## 2023-03-27 DIAGNOSIS — J4489 Other specified chronic obstructive pulmonary disease: Secondary | ICD-10-CM | POA: Diagnosis present

## 2023-03-27 DIAGNOSIS — K219 Gastro-esophageal reflux disease without esophagitis: Secondary | ICD-10-CM | POA: Diagnosis present

## 2023-03-27 DIAGNOSIS — Z6841 Body Mass Index (BMI) 40.0 and over, adult: Secondary | ICD-10-CM

## 2023-03-27 DIAGNOSIS — I428 Other cardiomyopathies: Secondary | ICD-10-CM | POA: Diagnosis not present

## 2023-03-27 DIAGNOSIS — Z794 Long term (current) use of insulin: Secondary | ICD-10-CM | POA: Diagnosis not present

## 2023-03-27 DIAGNOSIS — I4819 Other persistent atrial fibrillation: Secondary | ICD-10-CM | POA: Diagnosis not present

## 2023-03-27 DIAGNOSIS — Z888 Allergy status to other drugs, medicaments and biological substances status: Secondary | ICD-10-CM

## 2023-03-27 DIAGNOSIS — I081 Rheumatic disorders of both mitral and tricuspid valves: Secondary | ICD-10-CM | POA: Diagnosis present

## 2023-03-27 DIAGNOSIS — R112 Nausea with vomiting, unspecified: Secondary | ICD-10-CM

## 2023-03-27 DIAGNOSIS — Z882 Allergy status to sulfonamides status: Secondary | ICD-10-CM

## 2023-03-27 DIAGNOSIS — Z91030 Bee allergy status: Secondary | ICD-10-CM

## 2023-03-27 HISTORY — DX: Noninfective gastroenteritis and colitis, unspecified: K52.9

## 2023-03-27 LAB — CBC WITH DIFFERENTIAL/PLATELET
Abs Immature Granulocytes: 0.03 10*3/uL (ref 0.00–0.07)
Basophils Absolute: 0 10*3/uL (ref 0.0–0.1)
Basophils Relative: 0 %
Eosinophils Absolute: 0 10*3/uL (ref 0.0–0.5)
Eosinophils Relative: 1 %
HCT: 33.5 % — ABNORMAL LOW (ref 36.0–46.0)
Hemoglobin: 10.4 g/dL — ABNORMAL LOW (ref 12.0–15.0)
Immature Granulocytes: 0 %
Lymphocytes Relative: 9 %
Lymphs Abs: 0.7 10*3/uL (ref 0.7–4.0)
MCH: 30.5 pg (ref 26.0–34.0)
MCHC: 31 g/dL (ref 30.0–36.0)
MCV: 98.2 fL (ref 80.0–100.0)
Monocytes Absolute: 0.4 10*3/uL (ref 0.1–1.0)
Monocytes Relative: 6 %
Neutro Abs: 6.5 10*3/uL (ref 1.7–7.7)
Neutrophils Relative %: 84 %
Platelets: 167 10*3/uL (ref 150–400)
RBC: 3.41 MIL/uL — ABNORMAL LOW (ref 3.87–5.11)
RDW: 16.1 % — ABNORMAL HIGH (ref 11.5–15.5)
WBC: 7.7 10*3/uL (ref 4.0–10.5)
nRBC: 0.3 % — ABNORMAL HIGH (ref 0.0–0.2)

## 2023-03-27 LAB — COMPREHENSIVE METABOLIC PANEL
ALT: 12 U/L (ref 0–44)
AST: 17 U/L (ref 15–41)
Albumin: 3.8 g/dL (ref 3.5–5.0)
Alkaline Phosphatase: 75 U/L (ref 38–126)
Anion gap: 14 (ref 5–15)
BUN: 22 mg/dL (ref 8–23)
CO2: 27 mmol/L (ref 22–32)
Calcium: 9.4 mg/dL (ref 8.9–10.3)
Chloride: 95 mmol/L — ABNORMAL LOW (ref 98–111)
Creatinine, Ser: 6.39 mg/dL — ABNORMAL HIGH (ref 0.44–1.00)
GFR, Estimated: 7 mL/min — ABNORMAL LOW (ref >60)
Glucose, Bld: 113 mg/dL — ABNORMAL HIGH (ref 70–99)
Potassium: 3.4 mmol/L — ABNORMAL LOW (ref 3.5–5.1)
Sodium: 136 mmol/L (ref 135–145)
Total Bilirubin: 0.9 mg/dL (ref 0.3–1.2)
Total Protein: 8.2 g/dL — ABNORMAL HIGH (ref 6.5–8.1)

## 2023-03-27 LAB — LIPASE, BLOOD: Lipase: 29 U/L (ref 11–51)

## 2023-03-27 LAB — SARS CORONAVIRUS 2 BY RT PCR: SARS Coronavirus 2 by RT PCR: POSITIVE — AB

## 2023-03-27 LAB — GLUCOSE, CAPILLARY: Glucose-Capillary: 113 mg/dL — ABNORMAL HIGH (ref 70–99)

## 2023-03-27 LAB — TROPONIN I (HIGH SENSITIVITY): Troponin I (High Sensitivity): 8 ng/L (ref <18)

## 2023-03-27 MED ORDER — SODIUM CHLORIDE 0.9 % IV BOLUS
500.0000 mL | Freq: Once | INTRAVENOUS | Status: AC
  Administered 2023-03-27: 500 mL via INTRAVENOUS

## 2023-03-27 MED ORDER — AMIODARONE HCL 200 MG PO TABS
200.0000 mg | ORAL_TABLET | Freq: Every day | ORAL | Status: DC
  Administered 2023-03-28 – 2023-03-30 (×3): 200 mg via ORAL
  Filled 2023-03-27 (×4): qty 1

## 2023-03-27 MED ORDER — ALBUTEROL SULFATE HFA 108 (90 BASE) MCG/ACT IN AERS: 1.0000 | INHALATION_SPRAY | Freq: Four times a day (QID) | RESPIRATORY_TRACT | Status: DC | PRN

## 2023-03-27 MED ORDER — MIDODRINE HCL 5 MG PO TABS
10.0000 mg | ORAL_TABLET | Freq: Once | ORAL | Status: DC
  Filled 2023-03-27: qty 2

## 2023-03-27 MED ORDER — MIDODRINE HCL 5 MG PO TABS
10.0000 mg | ORAL_TABLET | ORAL | Status: DC
  Administered 2023-03-27 – 2023-03-29 (×2): 10 mg via ORAL
  Filled 2023-03-27 (×2): qty 2

## 2023-03-27 MED ORDER — SODIUM CHLORIDE 0.9 % IV SOLN
12.5000 mg | Freq: Four times a day (QID) | INTRAVENOUS | Status: DC | PRN
  Administered 2023-03-27: 12.5 mg via INTRAVENOUS
  Filled 2023-03-27: qty 12.5

## 2023-03-27 MED ORDER — LIDOCAINE-PRILOCAINE 2.5-2.5 % EX CREA
1.0000 | TOPICAL_CREAM | CUTANEOUS | Status: DC
  Filled 2023-03-27: qty 5

## 2023-03-27 MED ORDER — SODIUM CHLORIDE 0.45 % IV SOLN: INTRAVENOUS | Status: DC

## 2023-03-27 MED ORDER — EPINEPHRINE 0.3 MG/0.3ML IJ SOAJ: 0.3000 mg | INTRAMUSCULAR | Status: DC | PRN

## 2023-03-27 MED ORDER — AMIODARONE HCL 200 MG PO TABS: 200.0000 mg | ORAL_TABLET | Freq: Every day | ORAL | Status: DC

## 2023-03-27 MED ORDER — LORATADINE 10 MG PO TABS
10.0000 mg | ORAL_TABLET | Freq: Every day | ORAL | Status: DC
  Administered 2023-03-27 – 2023-03-30 (×4): 10 mg via ORAL
  Filled 2023-03-27 (×5): qty 1

## 2023-03-27 MED ORDER — DIPHENOXYLATE-ATROPINE 2.5-0.025 MG PO TABS
1.0000 | ORAL_TABLET | Freq: Four times a day (QID) | ORAL | Status: DC | PRN
  Administered 2023-03-28 – 2023-03-29 (×2): 1 via ORAL
  Filled 2023-03-27 (×3): qty 1

## 2023-03-27 MED ORDER — DULAGLUTIDE 1.5 MG/0.5ML ~~LOC~~ SOAJ: 1.5000 mg | SUBCUTANEOUS | Status: DC

## 2023-03-27 MED ORDER — MECLIZINE HCL 25 MG PO TABS: 25.0000 mg | ORAL_TABLET | Freq: Three times a day (TID) | ORAL | Status: DC | PRN

## 2023-03-27 MED ORDER — PANTOPRAZOLE SODIUM 40 MG PO TBEC
40.0000 mg | DELAYED_RELEASE_TABLET | Freq: Every day | ORAL | Status: DC
  Administered 2023-03-27 – 2023-03-30 (×4): 40 mg via ORAL
  Filled 2023-03-27 (×5): qty 1

## 2023-03-27 MED ORDER — BOOST / RESOURCE BREEZE PO LIQD CUSTOM
1.0000 | Freq: Three times a day (TID) | ORAL | Status: DC
  Administered 2023-03-30: 1 via ORAL

## 2023-03-27 MED ORDER — ONDANSETRON HCL 4 MG PO TABS
4.0000 mg | ORAL_TABLET | Freq: Four times a day (QID) | ORAL | Status: DC | PRN
  Administered 2023-03-27 (×2): 4 mg via ORAL
  Filled 2023-03-27 (×2): qty 1

## 2023-03-27 MED ORDER — NITROGLYCERIN 0.4 MG SL SUBL: 0.4000 mg | SUBLINGUAL_TABLET | SUBLINGUAL | Status: DC | PRN

## 2023-03-27 MED ORDER — DULOXETINE HCL 60 MG PO CPEP
60.0000 mg | ORAL_CAPSULE | Freq: Every day | ORAL | Status: DC
  Administered 2023-03-27 – 2023-03-30 (×4): 60 mg via ORAL
  Filled 2023-03-27 (×5): qty 1

## 2023-03-27 MED ORDER — ONDANSETRON HCL 4 MG/2ML IJ SOLN
4.0000 mg | Freq: Once | INTRAMUSCULAR | Status: AC
  Administered 2023-03-27: 4 mg via INTRAVENOUS
  Filled 2023-03-27: qty 2

## 2023-03-27 MED ORDER — APIXABAN 5 MG PO TABS
5.0000 mg | ORAL_TABLET | Freq: Two times a day (BID) | ORAL | Status: DC
  Administered 2023-03-27 – 2023-03-30 (×6): 5 mg via ORAL
  Filled 2023-03-27 (×7): qty 1

## 2023-03-27 MED ORDER — ACETAMINOPHEN 325 MG PO TABS
650.0000 mg | ORAL_TABLET | Freq: Four times a day (QID) | ORAL | Status: DC | PRN
  Administered 2023-03-27: 650 mg via ORAL
  Filled 2023-03-27: qty 2

## 2023-03-27 MED ORDER — ROSUVASTATIN CALCIUM 5 MG PO TABS
10.0000 mg | ORAL_TABLET | Freq: Every day | ORAL | Status: DC
  Administered 2023-03-27 – 2023-03-30 (×4): 10 mg via ORAL
  Filled 2023-03-27 (×5): qty 2

## 2023-03-27 MED ORDER — COLCHICINE 0.3 MG HALF TABLET
0.3000 mg | ORAL_TABLET | Freq: Every day | ORAL | Status: DC
  Administered 2023-03-27 – 2023-03-30 (×4): 0.3 mg via ORAL
  Filled 2023-03-27 (×4): qty 1

## 2023-03-27 MED ORDER — INSULIN ASPART 100 UNIT/ML IJ SOLN: 0.0000 [IU] | Freq: Three times a day (TID) | INTRAMUSCULAR | Status: DC

## 2023-03-27 MED ORDER — BUSPIRONE HCL 5 MG PO TABS
5.0000 mg | ORAL_TABLET | Freq: Two times a day (BID) | ORAL | Status: DC
  Administered 2023-03-27 – 2023-03-30 (×6): 5 mg via ORAL
  Filled 2023-03-27 (×6): qty 1

## 2023-03-27 NOTE — H&P (Signed)
History and Physical    [name] XBM:[id number] DOB: 13-Jun-1956 DOA: 03/27/2023  DOS: the patient was seen and examined on 03/27/2023  PCP: Grayce Sessions, NP   Patient coming from:  transferred in from HD unit  I have personally briefly reviewed patient's old medical records in [facility]  [name] is a 67 y.o. female with history of end-stage renal disease on dialysis, HFrEF, chronic atrial fibrillation, DM2, HTN, gastroparesis, GERD, OSA on CPAP.  Patient reports she felt okay yesterday but woke up this morning feeling extremely nauseated and weak.  She went to dialysis and completed nearly half of her session when she began to feel extremely nauseated and vomited. She has also being having loose watery stools. EMS was activated:  EMT reports the  patient was in A-fib with RVR, heart rate improved to about 100 bpm average en route to the hospital.  She was given 1 dose of Zofran with no improvement of her symptoms.  The patient reports she is to continue to feel extremely nauseated, continues to have vomiting and loose stools.  She reports some chest discomfort.  She denies fever, headache, sore throat.  Denies sick contacts in the house.    ED Course: T 97.6  112/86  HR 131  RR 22  AN overweight woman in no acute distress but with continued abdominal pain, N/V/D. She was able to complete 2 hrs of HD today prior to getting to sick to continue. Lab : K 3.4  Glucose 113, LFTs nl, Troponin 8, WBC 7.7 w/ 84/9/6. Covid 19 positive. EKG a fib at 129 w/o STEMI. She was given fluid bolus in ED. TRH called to admit for further evaluation and treatment for gastroenteritis.  Review of Systems:  Review of Systems  Constitutional:  Negative for chills, fever and weight loss.  HENT: Negative.    Eyes: Negative.   Respiratory: Negative.    Cardiovascular:  Positive for chest pain and palpitations.       C/o mild substernal discomfort  Gastrointestinal:  Positive for  diarrhea, nausea and vomiting.  Genitourinary: Negative.   Musculoskeletal: Negative.   Skin: Negative.   Neurological:  Positive for weakness and headaches.  Endo/Heme/Allergies: Negative.   Psychiatric/Behavioral: Negative.      Past Medical History:  Diagnosis Date   Acute on chronic systolic CHF (congestive heart failure) (HCC) 12/21/2020   Allergic rhinitis    Anemia    Arthritis    Asthma    Chronic diastolic CHF (congestive heart failure) (HCC)    Chronic kidney disease    Dialysis Tu/TH/Sa   COPD (chronic obstructive pulmonary disease) (HCC)    Depression    DM (diabetes mellitus) (HCC)    DVT (deep vein thrombosis) in pregnancy    GERD (gastroesophageal reflux disease)    HTN (hypertension)    Hyperlipidemia    Obesity    Pneumonia 04/2018   RIGHT LOBE   Sleep apnea    needs to be retested - to get new cpap    Past Surgical History:  Procedure Laterality Date   ABDOMINAL HYSTERECTOMY     AV FISTULA PLACEMENT Left 03/17/2022   Procedure: LEFT ARM ARTERIOVENOUS (AV) FISTULA CREATION;  Surgeon: Chuck Hint, MD;  Location: [facility] OR;  Service: Vascular;  Laterality: Left;   BIOPSY  01/12/2023   Procedure: BIOPSY;  Surgeon: Jeani Hawking, MD;  Location: [facility] ENDOSCOPY;  Service: Gastroenterology;;   BUBBLE STUDY  11/26/2020   Procedure: BUBBLE  STUDY;  Surgeon: Jodelle Red, MD;  Location: [facility] ENDOSCOPY;  Service: Cardiovascular;;   CARDIOVERSION N/A 05/06/2020   Procedure: CARDIOVERSION;  Surgeon: Jodelle Red, MD;  Location: [facility] ENDOSCOPY;  Service: Cardiovascular;  Laterality: N/A;   CARDIOVERSION N/A 11/04/2020   Procedure: CARDIOVERSION;  Surgeon: Lewayne Bunting, MD;  Location: [facility] ENDOSCOPY;  Service: Cardiovascular;  Laterality: N/A;   CARDIOVERSION N/A 02/01/2021   Procedure: CARDIOVERSION;  Surgeon: Dolores Patty, MD;  Location: [facility] ENDOSCOPY;  Service: Cardiovascular;  Laterality: N/A;   CARDIOVERSION N/A 02/09/2021   Procedure:  CARDIOVERSION;  Surgeon: Dolores Patty, MD;  Location: [facility] ENDOSCOPY;  Service: Cardiovascular;  Laterality: N/A;   CARDIOVERSION N/A 04/19/2021   Procedure: CARDIOVERSION;  Surgeon: Pricilla Riffle, MD;  Location: [facility] ENDOSCOPY;  Service: Cardiovascular;  Laterality: N/A;   CARDIOVERSION N/A 11/25/2021   Procedure: CARDIOVERSION;  Surgeon: Dolores Patty, MD;  Location: [facility] ENDOSCOPY;  Service: Cardiovascular;  Laterality: N/A;   CARDIOVERSION N/A 01/31/2022   Procedure: CARDIOVERSION;  Surgeon: Dolores Patty, MD;  Location: [facility] ENDOSCOPY;  Service: Cardiovascular;  Laterality: N/A;   CATARACT EXTRACTION, BILATERAL     CHOLECYSTECTOMY     COLONOSCOPY WITH PROPOFOL N/A 03/05/2015   Procedure: COLONOSCOPY WITH PROPOFOL;  Surgeon: Jeani Hawking, MD;  Location: [facility] ENDOSCOPY;  Service: Endoscopy;  Laterality: N/A;   DIAGNOSTIC LAPAROSCOPY     ESOPHAGOGASTRODUODENOSCOPY (EGD) WITH PROPOFOL N/A 01/12/2023   Procedure: ESOPHAGOGASTRODUODENOSCOPY (EGD) WITH PROPOFOL;  Surgeon: Jeani Hawking, MD;  Location: [facility] ENDOSCOPY;  Service: Gastroenterology;  Laterality: N/A;   FISTULA SUPERFICIALIZATION Left 03/17/2022   Procedure: FISTULA SUPERFICIALIZATION -LEFT;  Surgeon: Chuck Hint, MD;  Location: [facility] OR;  Service: Vascular;  Laterality: Left;   ingrown hallux Left    IR FLUORO GUIDE CV LINE RIGHT  03/14/2022   IR US GUIDE VASC ACCESS RIGHT  03/14/2022   KNEE SURGERY     LEFT HEART CATH AND CORONARY ANGIOGRAPHY N/A 12/31/2017   Procedure: LEFT HEART CATH AND CORONARY ANGIOGRAPHY;  Surgeon: Rinaldo Cloud, MD;  Location: [facility] INVASIVE CV LAB;  Service: Cardiovascular;  Laterality: N/A;   MAZE N/A 12/29/2020   Procedure: MAZE;  Surgeon: Loreli Slot, MD;  Location: [facility] OR;  Service: Open Heart Surgery;  Laterality: N/A;   MITRAL VALVE REPAIR N/A 12/29/2020   Procedure: MITRAL VALVE REPAIR USING CARBOMEDICS ANNULOFLEX RING SIZE 30;  Surgeon: Loreli Slot, MD;  Location: [facility] OR;   Service: Open Heart Surgery;  Laterality: N/A;   REVISON OF ARTERIOVENOUS FISTULA Left 01/05/2023   Procedure: REVISION OF LEFT ARTERIOVENOUS FISTULA WITH INTERPOSITION PTFE GRAFT;  Surgeon: Chuck Hint, MD;  Location: [facility] OR;  Service: Vascular;  Laterality: Left;  ELIQUIS AND HOLD 48 X HOURS   RIGHT HEART CATH N/A 12/22/2020   Procedure: RIGHT HEART CATH;  Surgeon: Laurey Morale, MD;  Location: [facility] INVASIVE CV LAB;  Service: Cardiovascular;  Laterality: N/A;   RIGHT HEART CATH N/A 01/31/2021   Procedure: RIGHT HEART CATH;  Surgeon: Dolores Patty, MD;  Location: [facility] INVASIVE CV LAB;  Service: Cardiovascular;  Laterality: N/A;   RIGHT HEART CATH N/A 07/29/2021   Procedure: RIGHT HEART CATH;  Surgeon: Dolores Patty, MD;  Location: [facility] INVASIVE CV LAB;  Service: Cardiovascular;  Laterality: N/A;   RIGHT HEART CATH N/A 11/24/2021   Procedure: RIGHT HEART CATH;  Surgeon: Dolores Patty, MD;  Location: [facility] INVASIVE CV LAB;  Service: Cardiovascular;  Laterality: N/A;   RIGHT HEART CATH N/A 02/14/2022  Procedure: RIGHT HEART CATH;  Surgeon: Dolores Patty, MD;  Location: [facility] INVASIVE CV LAB;  Service: Cardiovascular;  Laterality: N/A;   RIGHT HEART CATH N/A 02/22/2022   Procedure: RIGHT HEART CATH;  Surgeon: Laurey Morale, MD;  Location: [facility] INVASIVE CV LAB;  Service: Cardiovascular;  Laterality: N/A;   TEE WITHOUT CARDIOVERSION N/A 11/26/2020   Procedure: TRANSESOPHAGEAL ECHOCARDIOGRAM (TEE);  Surgeon: Jodelle Red, MD;  Location: [facility] ENDOSCOPY;  Service: Cardiovascular;  Laterality: N/A;   TEE WITHOUT CARDIOVERSION N/A 12/29/2020   Procedure: TRANSESOPHAGEAL ECHOCARDIOGRAM (TEE);  Surgeon: Loreli Slot, MD;  Location: [facility] OR;  Service: Open Heart Surgery;  Laterality: N/A;   TEE WITHOUT CARDIOVERSION N/A 01/20/2022   Procedure: TRANSESOPHAGEAL ECHOCARDIOGRAM (TEE);  Surgeon: Dolores Patty, MD;  Location: [facility] ENDOSCOPY;  Service: Cardiovascular;  Laterality: N/A;    TRICUSPID VALVE REPLACEMENT N/A 12/29/2020   Procedure: 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 RING SIZE 34;  Surgeon: Loreli Slot, MD;  Location: [facility] OR;  Service: Open Heart Surgery;  Laterality: N/A;    Soc Hx - Widowed. LIves with family member    reports that she has never smoked. She has never been exposed to tobacco smoke. She has never used smokeless tobacco. She reports that she does not drink alcohol and does not use drugs.  Allergies  Allergen Reactions   Bee Pollen Anaphylaxis and Other (See Comments)    UNCONFIRMED, but IS allergic to bee VENOM   Bee Venom Anaphylaxis   Sulfa Antibiotics Itching   Iodinated Contrast Media Nausea And Vomiting    Family History  Problem Relation Age of Onset   Breast cancer Mother    Cancer Mother    Hypertension Mother    Cancer Father    Hypertension Sister    Hypertension Brother    CAD Other     Prior to Admission medications   Medication Sig Start Date End Date Taking? Authorizing Provider  acetaminophen (TYLENOL) 325 MG tablet Take 2 tablets (650 mg total) by mouth every 6 (six) hours as needed for mild pain or moderate pain. 12/07/21  Yes Arrien, York Ram, MD  albuterol (PROVENTIL HFA) 108 (90 Base) MCG/ACT inhaler Inhale 1-2 puffs into the lungs every 6 (six) hours as needed for wheezing or shortness of breath. 10/13/22  Yes Grayce Sessions, NP  amiodarone (PACERONE) 200 MG tablet Take 1 tablet (200 mg total) by mouth daily. 03/18/23  Yes Jonah Blue, MD  apixaban (ELIQUIS) 5 MG TABS tablet Take 1 tablet (5 mg total) by mouth 2 (two) times daily. 02/21/23  Yes Bensimhon, Bevelyn Buckles, MD  busPIRone (BUSPAR) 5 MG tablet TAKE ONE TABLET BY MOUTH TWICE DAILY @ 9AM & 5PM 09/06/22  Yes Grayce Sessions, NP  colchicine 0.6 MG tablet Take 1/2 (one-half) tablet by mouth once daily 11/23/22  Yes Newlin, Enobong, MD  Dulaglutide (TRULICITY) 1.5 MG/0.5ML SOPN Inject 1.5 mg into the skin once a week. 07/05/22   Yes Grayce Sessions, NP  DULoxetine (CYMBALTA) 60 MG capsule TAKE ONE CAPSULE (60MG  TOTAL) BY MOUTH DAILY AT 9AM 02/12/23  Yes Grayce Sessions, NP  EPINEPHrine 0.3 mg/0.3 mL IJ SOAJ injection Inject 0.3 mg into the muscle as needed for anaphylaxis. 06/25/20  Yes Grayce Sessions, NP  insulin lispro (HUMALOG KWIKPEN) 100 UNIT/ML KwikPen Inject 10 Units into the skin 3 (three) times daily. Patient taking differently: Inject 0-5 Units into the skin 3 (three) times daily. Sliding scale 07/05/22  Yes Gwinda Passe  P, NP  lidocaine-prilocaine (EMLA) cream Apply 1 Application topically Every Tuesday,Thursday,and Saturday with dialysis. 06/01/22  Yes [provider]  loratadine (CLARITIN) 10 MG tablet TAKE ONE TABLET BY MOUTH DAILY AT 9AM 09/06/22  Yes Grayce Sessions, NP  meclizine (ANTIVERT) 25 MG tablet Take 1 tablet (25 mg total) by mouth 3 (three) times daily as needed for dizziness. 03/18/23  Yes Jonah Blue, MD  midodrine (PROAMATINE) 10 MG tablet Take 1 tablet (10 mg total) by mouth daily. Patient taking differently: Take 10 mg by mouth See admin instructions. Take 10mg  (1 tablet) by mouth on Tuesday's, Thursday's, and Saturday's. 03/18/23  Yes Jonah Blue, MD  Multiple Vitamins-Minerals (MULTIVITAMIN WOMEN PO) Take 1 tablet by mouth daily.   Yes [provider]  nitroGLYCERIN (NITROSTAT) 0.4 MG SL tablet Place 1 tablet (0.4 mg total) under the tongue every 5 (five) minutes x 3 doses as needed for chest pain. 01/08/14  Yes Rinaldo Cloud, MD  ondansetron (ZOFRAN) 4 MG tablet Take 1 tablet (4 mg total) by mouth every 6 (six) hours. Patient taking differently: Take 4 mg by mouth every 6 (six) hours as needed for nausea or vomiting. 02/24/23  Yes Wynetta Fines, MD  pantoprazole (PROTONIX) 40 MG tablet TAKE ONE TABLET BY MOUTH DAILY AT 9AM 04/17/22  Yes Grayce Sessions, NP  rosuvastatin (CRESTOR) 10 MG tablet TAKE ONE TABLET BY MOUTH DAILY AT 5PM 12/05/22  Yes  Bensimhon, Bevelyn Buckles, MD  Blood Glucose Monitoring Suppl (ACCU-CHEK GUIDE) w/Device KIT Use to check blood sugar TID. 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 Dx: E11.69 Last reading: 105, 02-20-2023, Fasting. 06/28/22   Hoy Register, MD  glucose blood (ACCU-CHEK GUIDE) test strip USE TO CHECK BLOOD SUGAR 3 TIMES DAILY 04/03/22   Hoy Register, MD  torsemide (DEMADEX) 100 MG tablet Take 100 mg by mouth See admin instructions.  Take Sunday,Monday, Wednesday and Friday Patient not taking: Reported on 03/27/2023 04/02/22   [provider]    Physical Exam: Vitals:   03/27/23 1230 03/27/23 1245 03/27/23 1400 03/27/23 1414  BP: 102/63 105/78 (!) 120/95   Pulse: (!) 106 (!) 107 (!) 140   Resp: (!) 21 (!) 22 (!) 28   Temp:    98.4 F (36.9 C)  SpO2: 97% 98% 100%     Physical Exam Vitals and nursing note reviewed.  Constitutional:      General: She is not in acute distress.    Appearance: She is obese. She is ill-appearing.     Comments: Communication difficult due to profound stutter  HENT:     Head: Normocephalic and atraumatic.     Nose: Nose normal.     Mouth/Throat:     Mouth: Mucous membranes are dry.     Pharynx: Oropharynx is clear. No oropharyngeal exudate.  Eyes:     General: No scleral icterus.    Extraocular Movements: Extraocular movements intact.     Conjunctiva/sclera: Conjunctivae normal.     Pupils: Pupils are equal, round, and reactive to light.  Neck:     Vascular: No carotid bruit.  Cardiovascular:     Rate and Rhythm: Tachycardia present. Rhythm irregular.     Pulses: Normal pulses.     Heart sounds: Normal heart sounds. No murmur heard. Pulmonary:     Effort: Pulmonary effort is normal.     Breath sounds: Normal breath sounds.  Abdominal:     General: Bowel sounds are normal.  Palpations: Abdomen is soft.     Tenderness: There is abdominal tenderness. There is no guarding or rebound.     Comments: Diffuse abdominal  tenderness w/o HSM, mass, rebound  Musculoskeletal:        General: Normal range of motion.     Cervical back: Neck supple.     Right lower leg: No edema.     Left lower leg: No edema.  Skin:    General: Skin is warm and dry.  Neurological:     General: No focal deficit present.     Mental Status: She is alert and oriented to person, place, and time.     Cranial Nerves: No cranial nerve deficit.  Psychiatric:        Mood and Affect: Mood normal.        Behavior: Behavior normal.      Labs on Admission: I have personally reviewed following labs and imaging studies  CBC: Recent Labs  Lab 03/27/23 0946  WBC 7.7  NEUTROABS 6.5  HGB 10.4*  HCT 33.5*  MCV 98.2  PLT 167   Basic Metabolic Panel: Recent Labs  Lab 03/27/23 0946  NA 136  K 3.4*  CL 95*  CO2 27  GLUCOSE 113*  BUN 22  CREATININE 6.39*  CALCIUM 9.4   GFR: Estimated Creatinine Clearance: 11.9 mL/min (A) (by C-G formula based on SCr of 6.39 mg/dL (H)). Liver Function Tests: Recent Labs  Lab 03/27/23 0946  AST 17  ALT 12  ALKPHOS 75  BILITOT 0.9  PROT 8.2*  ALBUMIN 3.8   Recent Labs  Lab 03/27/23 0946  LIPASE 29   No results for input(s): "AMMONIA" in the last 168 hours. Coagulation Profile: No results for input(s): "INR", "PROTIME" in the last 168 hours. Cardiac Enzymes: No results for input(s): "CKTOTAL", "CKMB", "CKMBINDEX", "TROPONINI" in the last 168 hours. BNP (last 3 results) No results for input(s): "PROBNP" in the last 8760 hours. HbA1C: No results for input(s): "HGBA1C" in the last 72 hours. CBG: No results for input(s): "GLUCAP" in the last 168 hours. Lipid Profile: No results for input(s): "CHOL", "HDL", "LDLCALC", "TRIG", "CHOLHDL", "LDLDIRECT" in the last 72 hours. Thyroid Function Tests: No results for input(s): "TSH", "T4TOTAL", "FREET4", "T3FREE", "THYROIDAB" in the last 72 hours. Anemia Panel: No results for input(s): "VITAMINB12", "FOLATE", "FERRITIN", "TIBC", "IRON",  "RETICCTPCT" in the last 72 hours. Urine analysis:    Component Value Date/Time   COLORURINE AMBER (A) 11/07/2022 1447   APPEARANCEUR CLOUDY (A) 11/07/2022 1447   LABSPEC 1.015 11/07/2022 1447   PHURINE 5.0 11/07/2022 1447   GLUCOSEU NEGATIVE 11/07/2022 1447   HGBUR SMALL (A) 11/07/2022 1447   BILIRUBINUR small (A) 02/21/2023 1748   KETONESUR trace (5) (A) 02/21/2023 1748   KETONESUR NEGATIVE 11/07/2022 1447   PROTEINUR =100 (A) 02/21/2023 1748   PROTEINUR 100 (A) 11/07/2022 1447   UROBILINOGEN 0.2 02/21/2023 1748   UROBILINOGEN 1.0 10/26/2014 0249   NITRITE Negative 02/21/2023 1748   NITRITE NEGATIVE 11/07/2022 1447   LEUKOCYTESUR Negative 02/21/2023 1748   LEUKOCYTESUR TRACE (A) 11/07/2022 1447    Radiological Exams on Admission: I have personally reviewed images CT Head Wo Contrast  Result Date: 03/27/2023 CLINICAL DATA:  Altered mental status EXAM: CT HEAD WITHOUT CONTRAST TECHNIQUE: Contiguous axial images were obtained from the base of the skull through the vertex without intravenous contrast. RADIATION DOSE REDUCTION: This exam was performed according to the departmental dose-optimization program which includes automated exposure control, adjustment of the mA and/or kV according  to patient size and/or use of iterative reconstruction technique. COMPARISON:  03/14/2023 FINDINGS: Brain: No acute intracranial findings are seen in noncontrast CT brain. There are no signs of bleeding within the cranium. Ventricles are not dilated. There is no focal edema or mass effect. Vascular: Unremarkable. Skull: Hyperostosis frontalis interna is seen. No acute findings are seen. Sinuses/Orbits: Unremarkable. Other: None. IMPRESSION: No acute intracranial findings are seen in noncontrast CT brain. Electronically Signed   By: Ernie Avena M.D.   On: 03/27/2023 14:23   DG Chest Portable 1 View  Result Date: 03/27/2023 CLINICAL DATA:  Chest pain EXAM: PORTABLE CHEST 1 VIEW COMPARISON:  X-ray  03/14/2023 FINDINGS: Underinflated x-ray. Sternal wires. Atrial occlusion clip. Prosthetic valve. Right IJ double-lumen catheter with tip along the right atrium. Enlarged heart with vascular congestion. Question trace edema. No pneumothorax or effusion. Eventration of the right hemidiaphragm. No pneumothorax. Overlapping cardiac leads. IMPRESSION: Postop chest. Enlarged heart with vascular congestion and trace edema. Right IJ catheter. Electronically Signed   By: Karen Kays M.D.   On: 03/27/2023 10:30    EKG: I have personally reviewed EKG: atrial fibrillation, no STEMI  Assessment/Plan Active Problems:   Viral gastroenteritis   Chronic atrial fibrillation (HCC)   ESRD on dialysis (HCC)   Type 2 diabetes mellitus without complications (HCC)   Gastroparesis   GERD (gastroesophageal reflux disease)   OSA on CPAP   Essential hypertension   COPD (chronic obstructive pulmonary disease) (HCC)   HFrEF (heart failure with reduced ejection fraction) (HCC)    Assessment and Plan: Viral gastroenteritis Patient with new onset N/V/D. She is covid 19 positive - possible origin of symptoms vs norovirus, other.    Plan Medical tele admit - due to S. Fibrillation with RVR  IVF - received IV fluid bolus in ED; will continue at 75  Zofran for nausea/vomiting  GI intestinal panel PCR r/o other cause of symptoms  Consulted pharmacy - recommends no antiviral treatment at this time.   Clear liquid diet - advance as tolerated  Type 2 diabetes mellitus without complications (HCC) Last A1C 03/13/23 6%  Plan Continue home regimen  Sliding scale coverage   ESRD on dialysis [facility]) Patient with ESRD-HD with HD M-W-F. She almost had a full treatment on the day of admission. No severe metabolic derangement at the time of admission.  Plan Nephrology consult for continuation of HD while she is an in-patient  Chronic atrial fibrillation [facility]) Patient followed by cardiology as outpatient.   Plan Medical tele  admit due to rate control Continue current medication regimen  OSA on CPAP May use home equipment. If not available will have RT set her up.   GERD (gastroesophageal reflux disease) Patient w/o acute complaints of heartburn  Plan Continue PPI  Gastroparesis Long standing problem. On no chronic medications.   Essential hypertension BP a little low but this is chronic with HD. She takes midodrin on HD days.  Plan Continue home medication.   COPD (chronic obstructive pulmonary disease) (HCC) No acute respiratory symptoms. Good oxygen saturation.  Plan Continue prn SABA  HFrEF (heart failure with reduced ejection fraction) (HCC) No evidence of decompensation.  Plan Careful hydration  Daily weights, strict I&Os  Continue home medications.        DVT prophylaxis: SQ Heparin Code Status: Full Code Family Communication: spoke with Franki Monte - daughter  Disposition Plan: home when stable  Consults called: nephrology - Dr. Arta Silence for HD  Admission status: Inpatient, Telemetry bed   Casimiro Needle  , MD [facility] 03/27/2023, 2:26 PM

## 2023-03-27 NOTE — Subjective & Objective (Signed)
[name] is a 67 y.o. female with history of end-stage renal disease on dialysis, HFrEF, chronic atrial fibrillation, DM2, HTN, gastroparesis, GERD, OSA on CPAP.  Patient reports she felt okay yesterday but woke up this morning feeling extremely nauseated and weak.  She went to dialysis and completed nearly half of her session when she began to feel extremely nauseated and vomited. She has also being having loose watery stools. EMS was activated:  EMT reports the  patient was in A-fib with RVR, heart rate improved to about 100 bpm average en route to the hospital.  She was given 1 dose of Zofran with no improvement of her symptoms.  The patient reports she is to continue to feel extremely nauseated, continues to have vomiting and loose stools.  She reports some chest discomfort.  She denies fever, headache, sore throat.  Denies sick contacts in the house.

## 2023-03-27 NOTE — Assessment & Plan Note (Signed)
Long standing problem. On no chronic medications.

## 2023-03-27 NOTE — ED Notes (Signed)
edh     

## 2023-03-27 NOTE — Consult Note (Signed)
Renal Service Consult Note [facility]  [name] 03/27/2023 [name], MD Requesting Physician: Dr. Alvino Chapel  Reason for Consult: ESRD pt w/ nausea, vomiting and diarrhea HPI: The patient is a 67 y.o. year-old w/ PMH as below who presented to ED 8/06 c/o n/v and diarrhea. Per EMT she had afib/ RVR. In ED pt had abd pain. She completed about 2 hrs of her OP HD. COVID was +. WBC 7K, K+ 3.4, trop 8. Pt rec'd IVF bolus and was admitted. We are asked to see her for esrd.    Pt seen in room. Main c/o's were n/v, abd pain.  No SOB.       ROS - denies CP, no joint pain, no HA, no blurry vision, no rash, no diarrhea, no nausea/ vomiting, no dysuria, no difficulty voiding   Past Medical History  Past Medical History:  Diagnosis Date   Acute on chronic systolic CHF (congestive heart failure) (HCC) 12/21/2020   Allergic rhinitis    Anemia    Arthritis    Asthma    Chronic diastolic CHF (congestive heart failure) (HCC)    Chronic kidney disease    Dialysis Tu/TH/Sa   COPD (chronic obstructive pulmonary disease) (HCC)    Depression    DM (diabetes mellitus) (HCC)    DVT (deep vein thrombosis) in pregnancy    GERD (gastroesophageal reflux disease)    HTN (hypertension)    Hyperlipidemia    Obesity    Pneumonia 04/2018   RIGHT LOBE   Sleep apnea    needs to be retested - to get new cpap   Past Surgical History  Past Surgical History:  Procedure Laterality Date   ABDOMINAL HYSTERECTOMY     AV FISTULA PLACEMENT Left 03/17/2022   Procedure: LEFT ARM ARTERIOVENOUS (AV) FISTULA CREATION;  Surgeon: Chuck Hint, MD;  Location: [facility] OR;  Service: Vascular;  Laterality: Left;   BIOPSY  01/12/2023   Procedure: BIOPSY;  Surgeon: Jeani Hawking, MD;  Location: [facility] ENDOSCOPY;  Service: Gastroenterology;;   Thressa Sheller STUDY  11/26/2020   Procedure: BUBBLE STUDY;  Surgeon: Jodelle Red, MD;  Location: [facility] ENDOSCOPY;  Service: Cardiovascular;;    CARDIOVERSION N/A 05/06/2020   Procedure: CARDIOVERSION;  Surgeon: Jodelle Red, MD;  Location: [facility] ENDOSCOPY;  Service: Cardiovascular;  Laterality: N/A;   CARDIOVERSION N/A 11/04/2020   Procedure: CARDIOVERSION;  Surgeon: Lewayne Bunting, MD;  Location: [facility] ENDOSCOPY;  Service: Cardiovascular;  Laterality: N/A;   CARDIOVERSION N/A 02/01/2021   Procedure: CARDIOVERSION;  Surgeon: Dolores Patty, MD;  Location: [facility] ENDOSCOPY;  Service: Cardiovascular;  Laterality: N/A;   CARDIOVERSION N/A 02/09/2021   Procedure: CARDIOVERSION;  Surgeon: Dolores Patty, MD;  Location: [facility] ENDOSCOPY;  Service: Cardiovascular;  Laterality: N/A;   CARDIOVERSION N/A 04/19/2021   Procedure: CARDIOVERSION;  Surgeon: Pricilla Riffle, MD;  Location: [facility] ENDOSCOPY;  Service: Cardiovascular;  Laterality: N/A;   CARDIOVERSION N/A 11/25/2021   Procedure: CARDIOVERSION;  Surgeon: Dolores Patty, MD;  Location: [facility] ENDOSCOPY;  Service: Cardiovascular;  Laterality: N/A;   CARDIOVERSION N/A 01/31/2022   Procedure: CARDIOVERSION;  Surgeon: Dolores Patty, MD;  Location: [facility] ENDOSCOPY;  Service: Cardiovascular;  Laterality: N/A;   CATARACT EXTRACTION, BILATERAL     CHOLECYSTECTOMY     COLONOSCOPY WITH PROPOFOL N/A 03/05/2015   Procedure: COLONOSCOPY WITH PROPOFOL;  Surgeon: Jeani Hawking, MD;  Location: [facility] ENDOSCOPY;  Service: Endoscopy;  Laterality: N/A;   DIAGNOSTIC LAPAROSCOPY     ESOPHAGOGASTRODUODENOSCOPY (EGD) WITH PROPOFOL  N/A 01/12/2023   Procedure: ESOPHAGOGASTRODUODENOSCOPY (EGD) WITH PROPOFOL;  Surgeon: Jeani Hawking, MD;  Location: [facility] ENDOSCOPY;  Service: Gastroenterology;  Laterality: N/A;   FISTULA SUPERFICIALIZATION Left 03/17/2022   Procedure: FISTULA SUPERFICIALIZATION -LEFT;  Surgeon: Chuck Hint, MD;  Location: [facility] OR;  Service: Vascular;  Laterality: Left;   ingrown hallux Left    IR FLUORO GUIDE CV LINE RIGHT  03/14/2022   IR US GUIDE VASC ACCESS RIGHT  03/14/2022   KNEE SURGERY      LEFT HEART CATH AND CORONARY ANGIOGRAPHY N/A 12/31/2017   Procedure: LEFT HEART CATH AND CORONARY ANGIOGRAPHY;  Surgeon: Rinaldo Cloud, MD;  Location: [facility] INVASIVE CV LAB;  Service: Cardiovascular;  Laterality: N/A;   MAZE N/A 12/29/2020   Procedure: MAZE;  Surgeon: Loreli Slot, MD;  Location: [facility] OR;  Service: Open Heart Surgery;  Laterality: N/A;   MITRAL VALVE REPAIR N/A 12/29/2020   Procedure: MITRAL VALVE REPAIR USING CARBOMEDICS ANNULOFLEX RING SIZE 30;  Surgeon: Loreli Slot, MD;  Location: [facility] OR;  Service: Open Heart Surgery;  Laterality: N/A;   REVISON OF ARTERIOVENOUS FISTULA Left 01/05/2023   Procedure: REVISION OF LEFT ARTERIOVENOUS FISTULA WITH INTERPOSITION PTFE GRAFT;  Surgeon: Chuck Hint, MD;  Location: [facility] OR;  Service: Vascular;  Laterality: Left;  ELIQUIS AND HOLD 48 X HOURS   RIGHT HEART CATH N/A 12/22/2020   Procedure: RIGHT HEART CATH;  Surgeon: Laurey Morale, MD;  Location: [facility] INVASIVE CV LAB;  Service: Cardiovascular;  Laterality: N/A;   RIGHT HEART CATH N/A 01/31/2021   Procedure: RIGHT HEART CATH;  Surgeon: Dolores Patty, MD;  Location: [facility] INVASIVE CV LAB;  Service: Cardiovascular;  Laterality: N/A;   RIGHT HEART CATH N/A 07/29/2021   Procedure: RIGHT HEART CATH;  Surgeon: Dolores Patty, MD;  Location: [facility] INVASIVE CV LAB;  Service: Cardiovascular;  Laterality: N/A;   RIGHT HEART CATH N/A 11/24/2021   Procedure: RIGHT HEART CATH;  Surgeon: Dolores Patty, MD;  Location: [facility] INVASIVE CV LAB;  Service: Cardiovascular;  Laterality: N/A;   RIGHT HEART CATH N/A 02/14/2022   Procedure: RIGHT HEART CATH;  Surgeon: Dolores Patty, MD;  Location: [facility] INVASIVE CV LAB;  Service: Cardiovascular;  Laterality: N/A;   RIGHT HEART CATH N/A 02/22/2022   Procedure: RIGHT HEART CATH;  Surgeon: Laurey Morale, MD;  Location: [facility] INVASIVE CV LAB;  Service: Cardiovascular;  Laterality: N/A;   TEE WITHOUT CARDIOVERSION N/A 11/26/2020   Procedure:  TRANSESOPHAGEAL ECHOCARDIOGRAM (TEE);  Surgeon: Jodelle Red, MD;  Location: [facility] ENDOSCOPY;  Service: Cardiovascular;  Laterality: N/A;   TEE WITHOUT CARDIOVERSION N/A 12/29/2020   Procedure: TRANSESOPHAGEAL ECHOCARDIOGRAM (TEE);  Surgeon: Loreli Slot, MD;  Location: [facility] OR;  Service: Open Heart Surgery;  Laterality: N/A;   TEE WITHOUT CARDIOVERSION N/A 01/20/2022   Procedure: TRANSESOPHAGEAL ECHOCARDIOGRAM (TEE);  Surgeon: Dolores Patty, MD;  Location: [facility] ENDOSCOPY;  Service: Cardiovascular;  Laterality: N/A;   TRICUSPID VALVE REPLACEMENT N/A 12/29/2020   Procedure: 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 RING SIZE 34;  Surgeon: Loreli Slot, MD;  Location: [facility] OR;  Service: Open Heart Surgery;  Laterality: N/A;   Family History  Family History  Problem Relation Age of Onset   Breast cancer Mother    Cancer Mother    Hypertension Mother    Cancer Father    Hypertension Sister    Hypertension Brother    CAD Other    Social History  reports that she has never  smoked. She has never been exposed to tobacco smoke. She has never used smokeless tobacco. She reports that she does not drink alcohol and does not use drugs. Allergies  Allergies  Allergen Reactions   Bee Pollen Anaphylaxis and Other (See Comments)    UNCONFIRMED, but IS allergic to bee VENOM   Bee Venom Anaphylaxis   Sulfa Antibiotics Itching   Iodinated Contrast Media Nausea And Vomiting   Home medications Prior to Admission medications   Medication Sig Start Date End Date Taking? Authorizing Provider  acetaminophen (TYLENOL) 325 MG tablet Take 2 tablets (650 mg total) by mouth every 6 (six) hours as needed for mild pain or moderate pain. 12/07/21  Yes Arrien, York Ram, MD  albuterol (PROVENTIL HFA) 108 (90 Base) MCG/ACT inhaler Inhale 1-2 puffs into the lungs every 6 (six) hours as needed for wheezing or shortness of breath. 10/13/22  Yes Grayce Sessions, NP  amiodarone  (PACERONE) 200 MG tablet Take 1 tablet (200 mg total) by mouth daily. 03/18/23  Yes Jonah Blue, MD  apixaban (ELIQUIS) 5 MG TABS tablet Take 1 tablet (5 mg total) by mouth 2 (two) times daily. 02/21/23  Yes Bensimhon, Bevelyn Buckles, MD  busPIRone (BUSPAR) 5 MG tablet TAKE ONE TABLET BY MOUTH TWICE DAILY @ 9AM & 5PM 09/06/22  Yes Grayce Sessions, NP  colchicine 0.6 MG tablet Take 1/2 (one-half) tablet by mouth once daily 11/23/22  Yes Newlin, Enobong, MD  Dulaglutide (TRULICITY) 1.5 MG/0.5ML SOPN Inject 1.5 mg into the skin once a week. 07/05/22  Yes Grayce Sessions, NP  DULoxetine (CYMBALTA) 60 MG capsule TAKE ONE CAPSULE (60MG  TOTAL) BY MOUTH DAILY AT 9AM 02/12/23  Yes Grayce Sessions, NP  EPINEPHrine 0.3 mg/0.3 mL IJ SOAJ injection Inject 0.3 mg into the muscle as needed for anaphylaxis. 06/25/20  Yes Grayce Sessions, NP  insulin lispro (HUMALOG KWIKPEN) 100 UNIT/ML KwikPen Inject 10 Units into the skin 3 (three) times daily. Patient taking differently: Inject 0-5 Units into the skin 3 (three) times daily. Sliding scale 07/05/22  Yes Grayce Sessions, NP  lidocaine-prilocaine (EMLA) cream Apply 1 Application topically Every Tuesday,Thursday,and Saturday with dialysis. 06/01/22  Yes [provider]  loratadine (CLARITIN) 10 MG tablet TAKE ONE TABLET BY MOUTH DAILY AT 9AM 09/06/22  Yes Grayce Sessions, NP  meclizine (ANTIVERT) 25 MG tablet Take 1 tablet (25 mg total) by mouth 3 (three) times daily as needed for dizziness. 03/18/23  Yes Jonah Blue, MD  midodrine (PROAMATINE) 10 MG tablet Take 1 tablet (10 mg total) by mouth daily. Patient taking differently: Take 10 mg by mouth See admin instructions. Take 10mg  (1 tablet) by mouth on Tuesday's, Thursday's, and Saturday's. 03/18/23  Yes Jonah Blue, MD  Multiple Vitamins-Minerals (MULTIVITAMIN WOMEN PO) Take 1 tablet by mouth daily.   Yes [provider]  nitroGLYCERIN (NITROSTAT) 0.4 MG SL tablet Place 1 tablet  (0.4 mg total) under the tongue every 5 (five) minutes x 3 doses as needed for chest pain. 01/08/14  Yes Rinaldo Cloud, MD  ondansetron (ZOFRAN) 4 MG tablet Take 1 tablet (4 mg total) by mouth every 6 (six) hours. Patient taking differently: Take 4 mg by mouth every 6 (six) hours as needed for nausea or vomiting. 02/24/23  Yes Wynetta Fines, MD  pantoprazole (PROTONIX) 40 MG tablet TAKE ONE TABLET BY MOUTH DAILY AT 9AM 04/17/22  Yes Grayce Sessions, NP  rosuvastatin (CRESTOR) 10 MG tablet TAKE ONE TABLET BY MOUTH DAILY AT 5PM  12/05/22  Yes Bensimhon, Bevelyn Buckles, MD  Blood Glucose Monitoring Suppl (ACCU-CHEK GUIDE) w/Device KIT Use to check blood sugar TID. 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 Dx: E11.69 Last reading: 105, 02-20-2023, Fasting. 06/28/22   Hoy Register, MD  glucose blood (ACCU-CHEK GUIDE) test strip USE TO CHECK BLOOD SUGAR 3 TIMES DAILY 04/03/22   Hoy Register, MD  torsemide (DEMADEX) 100 MG tablet Take 100 mg by mouth See admin instructions.  Take Sunday,Monday, Wednesday and Friday Patient not taking: Reported on 03/27/2023 04/02/22   [provider]     Vitals:   03/27/23 1400 03/27/23 1414 03/27/23 1534 03/27/23 1618  BP: (!) 120/95   102/80  Pulse: (!) 140  (!) 116 (!) 111  Resp: (!) 28  (!) 30 (!) 22  Temp:  98.4 F (36.9 C)  98.9 F (37.2 C)  TempSrc:    Oral  SpO2: 100%  95% 100%   Exam Gen alert, no distress,appears slightly weak No rash, cyanosis or gangrene Sclera anicteric, throat clear  No jvd or bruits Chest clear bilat to bases, no rales/ wheezing RRR no RG Abd soft obese ntnd no mass or ascites +bs GU defer MS no joint effusions or deformity Ext no LE or UE edema, no wounds or ulcers Neuro is alert, Ox 3 , nf    RIJ TDC/  L AVF+bruit      Home meds include - albuterol, amiodarone, apixaban, buspirone, colchicine 0.3 every day, dulaglutide, duloxetine, epi pen, insulin lispro, emla cream, loratadine,  meclizine, midodrine 10mg  pre hd tts, mvi, sl ntg, ondansetron, pantoprazole, rosuvastatin, torsemide 100mg  non hd days      OP HD: SW TTS  4h  400/600   125.2kg  3K/2.5Ca bath TDC/ AVF   Heparin 2500 - last OP HD 8/06, post wt 128.5kg   - last 4 sessions coming off 2-3kg over - hectorol 2 mcg IV three times per week - venofer 50mg  IV weekly - mircera 75 mcg IV q 2 wks, last 8/06, due 8/20   Assessment/ Plan: Vomiting/ diarrhea/ abd pain - suspected viral GE, possibly related to COVID 19. Per pmd.  ESRD - on HD TTS. Had 2 hrs of HD yesterday. Labs/ vol good. Next HD tomorrow.  HTN/ volume - BP's wnl, not on BP lowering meds. Takes midodrine pre HD. Euvolemic on exam. 2kg up by wts Anemia esrd - Hb 10, no esa needs MBD ckd - CCa in range, add on phos. Cont IV vdra. Get binder info DM2 - on insulin  Covid 19+ - per pmd Chronic afib  COPD      Vinson Moselle  MD [facility] 03/27/2023, 4:45 PM  Recent Labs  Lab 03/27/23 0946  HGB 10.4*  ALBUMIN 3.8  CALCIUM 9.4  CREATININE 6.39*  K 3.4*   Inpatient medications:  amiodarone  200 mg Oral Daily   apixaban  5 mg Oral BID   busPIRone  5 mg Oral BID   colchicine  0.3 mg Oral Daily   Dulaglutide  1.5 mg Subcutaneous Weekly   DULoxetine  60 mg Oral Daily   insulin aspart  0-20 Units Subcutaneous TID WC   [START ON 03/29/2023] lidocaine-prilocaine  1 Application Topical Q T,Th,Sa-HD   loratadine  10 mg Oral Daily   midodrine  10 mg Oral Once   midodrine  10 mg Oral Q T,Th,Sat-1800   pantoprazole  40 mg Oral Daily   rosuvastatin  10 mg Oral Daily  sodium chloride     promethazine (PHENERGAN) injection (IM or IVPB) Stopped (03/27/23 1411)   acetaminophen, albuterol, diphenoxylate-atropine, EPINEPHrine, meclizine, nitroGLYCERIN, ondansetron, promethazine (PHENERGAN) injection (IM or IVPB)

## 2023-03-27 NOTE — ED Notes (Signed)
Patient transported to CT 

## 2023-03-27 NOTE — ED Notes (Signed)
Esther RN attempted to give the Pt her home medications.  Pt reports she is still nauseous and is currently refusing all medications.  Inpatient RN made aware.

## 2023-03-27 NOTE — Assessment & Plan Note (Signed)
Patient with new onset N/V/D. She is covid 19 positive - possible origin of symptoms vs norovirus, other.    Plan Medical tele admit - due to S. Fibrillation with RVR  IVF - received IV fluid bolus in ED; will continue at 75  Zofran for nausea/vomiting  GI intestinal panel PCR r/o other cause of symptoms  Consulted pharmacy - recommends no antiviral treatment at this time.   Clear liquid diet - advance as tolerated

## 2023-03-27 NOTE — Assessment & Plan Note (Signed)
Patient w/o acute complaints of heartburn  Plan Continue PPI

## 2023-03-27 NOTE — ED Notes (Signed)
Pt refused PO medications states she would not be able to keep it down. EDP notified.

## 2023-03-27 NOTE — Assessment & Plan Note (Signed)
Last A1C 03/13/23 6%  Plan Continue home regimen  Sliding scale coverage

## 2023-03-27 NOTE — Assessment & Plan Note (Signed)
BP a little low but this is chronic with HD. She takes midodrin on HD days.  Plan Continue home medication.

## 2023-03-27 NOTE — Assessment & Plan Note (Signed)
No acute respiratory symptoms. Good oxygen saturation.  Plan Continue prn SABA

## 2023-03-27 NOTE — Assessment & Plan Note (Signed)
Patient with ESRD-HD with HD M-W-F. She almost had a full treatment on the day of admission. No severe metabolic derangement at the time of admission.  Plan Nephrology consult for continuation of HD while she is an in-patient

## 2023-03-27 NOTE — Assessment & Plan Note (Signed)
May use home equipment. If not available will have RT set her up.

## 2023-03-27 NOTE — Assessment & Plan Note (Signed)
No evidence of decompensation.  Plan Careful hydration  Daily weights, strict I&Os  Continue home medications.

## 2023-03-27 NOTE — ED Triage Notes (Signed)
PT BIB EMS, was dialysis starting having nausea and vomiting, started at 0500 this morning with abdominal pain.  Now complains of chest pain to provider, currently in AFIB, HX of AFIB. BP at dialysis 70/50.  CBG 105 BP 100/60 HR 120's

## 2023-03-27 NOTE — ED Provider Notes (Signed)
I was called back into the room as the patient and developed acute onset of sudden stuttering, gibberish speech.  She is tearful on my reassessment.  She can shake her head yes or no appropriately to my questions when she tries to speak her lips are chattering.  I was able to review the medical record and she had a similar presentation in April 2023 with sudden onset of stuttering speech, for which she had a complete stroke workup including MRI that showed no focal abnormalities or stroke or lesion to explain the symptoms.  She recovered spontaneously afterwards.  I suspect in this clinical setting that this is likely a psychogenic episode or related to her acute COVID illness.  I still ordered a stat CT scan to evaluate the brain but I have not activated code stroke at this time.  Aside from her speech difficulty she has no other acute neurological deficits.  Admitting hospitalist Dr Debby Bud notified of this change.   Terald Sleeper, MD 03/27/23 1329

## 2023-03-27 NOTE — Progress Notes (Signed)
Patient has arrived to the floor  

## 2023-03-27 NOTE — ED Provider Notes (Signed)
CT head with no acute deficits.  Patient's speech issue seems to have resolved.   Terald Sleeper, MD 03/27/23 1434

## 2023-03-27 NOTE — ED Provider Notes (Addendum)
[facility] EMERGENCY DEPARTMENT AT [facility] Provider Note   CSN: [id number] Arrival date & time: 03/27/23  1308     History  Chief Complaint  Patient presents with   Emesis    Nausea    [name] is a 67 y.o. female with history of end-stage renal disease on dialysis, presenting from dialysis by EMS with concern for nausea, weakness.  Vomiting.  Patient reports she felt okay yesterday but woke up this morning feeling extremely nauseated and weak.  She went to dialysis and completed nearly half of her session where she began to feel extremely nauseated and vomited.  EMS reports the dialysis the patient was in A-fib with RVR, heart rate improved to about 100 bpm average en route to the hospital.  She was given 1 dose of Zofran with no improvement of her symptoms.  The patient reports she is to continue to feel extremely nauseated.  She reports some chest discomfort.  She denies fever, headache, sore throat.  Denies sick contacts in the house.  She is on Eliquis as well as amiodarone for her A-fib.  Hospitalized 2 weeks ago for dizziness - stopped Toprol XL at the time, started midodrine on dialysis days, concern for orthostatic hypotension  HPI     Home Medications Prior to Admission medications   Medication Sig Start Date End Date Taking? Authorizing Provider  acetaminophen (TYLENOL) 325 MG tablet Take 2 tablets (650 mg total) by mouth every 6 (six) hours as needed for mild pain or moderate pain. 12/07/21  Yes Arrien, York Ram, MD  albuterol (PROVENTIL HFA) 108 (90 Base) MCG/ACT inhaler Inhale 1-2 puffs into the lungs every 6 (six) hours as needed for wheezing or shortness of breath. 10/13/22  Yes Grayce Sessions, NP  amiodarone (PACERONE) 200 MG tablet Take 1 tablet (200 mg total) by mouth daily. 03/18/23  Yes Jonah Blue, MD  apixaban (ELIQUIS) 5 MG TABS tablet Take 1 tablet (5 mg total) by mouth 2 (two) times daily. 02/21/23  Yes Bensimhon, Bevelyn Buckles, MD  busPIRone (BUSPAR) 5 MG tablet TAKE ONE TABLET BY MOUTH TWICE DAILY @ 9AM & 5PM 09/06/22  Yes Grayce Sessions, NP  colchicine 0.6 MG tablet Take 1/2 (one-half) tablet by mouth once daily 11/23/22  Yes Newlin, Enobong, MD  Dulaglutide (TRULICITY) 1.5 MG/0.5ML SOPN Inject 1.5 mg into the skin once a week. 07/05/22  Yes Grayce Sessions, NP  DULoxetine (CYMBALTA) 60 MG capsule TAKE ONE CAPSULE (60MG  TOTAL) BY MOUTH DAILY AT 9AM 02/12/23  Yes Grayce Sessions, NP  EPINEPHrine 0.3 mg/0.3 mL IJ SOAJ injection Inject 0.3 mg into the muscle as needed for anaphylaxis. 06/25/20  Yes Grayce Sessions, NP  insulin lispro (HUMALOG KWIKPEN) 100 UNIT/ML KwikPen Inject 10 Units into the skin 3 (three) times daily. Patient taking differently: Inject 0-5 Units into the skin 3 (three) times daily. Sliding scale 07/05/22  Yes Grayce Sessions, NP  lidocaine-prilocaine (EMLA) cream Apply 1 Application topically Every Tuesday,Thursday,and Saturday with dialysis. 06/01/22  Yes [provider]  loratadine (CLARITIN) 10 MG tablet TAKE ONE TABLET BY MOUTH DAILY AT 9AM 09/06/22  Yes Grayce Sessions, NP  meclizine (ANTIVERT) 25 MG tablet Take 1 tablet (25 mg total) by mouth 3 (three) times daily as needed for dizziness. 03/18/23  Yes Jonah Blue, MD  midodrine (PROAMATINE) 10 MG tablet Take 1 tablet (10 mg total) by mouth daily. Patient taking differently: Take 10 mg by mouth See admin  instructions. Take 10mg  (1 tablet) by mouth on Tuesday's, Thursday's, and Saturday's. 03/18/23  Yes Jonah Blue, MD  Multiple Vitamins-Minerals (MULTIVITAMIN WOMEN PO) Take 1 tablet by mouth daily.   Yes [provider]  nitroGLYCERIN (NITROSTAT) 0.4 MG SL tablet Place 1 tablet (0.4 mg total) under the tongue every 5 (five) minutes x 3 doses as needed for chest pain. 01/08/14  Yes Rinaldo Cloud, MD  ondansetron (ZOFRAN) 4 MG tablet Take 1 tablet (4 mg total) by mouth every 6 (six) hours. Patient taking  differently: Take 4 mg by mouth every 6 (six) hours as needed for nausea or vomiting. 02/24/23  Yes Wynetta Fines, MD  pantoprazole (PROTONIX) 40 MG tablet TAKE ONE TABLET BY MOUTH DAILY AT 9AM 04/17/22  Yes Grayce Sessions, NP  rosuvastatin (CRESTOR) 10 MG tablet TAKE ONE TABLET BY MOUTH DAILY AT 5PM 12/05/22  Yes Bensimhon, Bevelyn Buckles, MD  Blood Glucose Monitoring Suppl (ACCU-CHEK GUIDE) w/Device KIT Use to check blood sugar TID. 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 Dx: E11.69 Last reading: 105, 02-20-2023, Fasting. 06/28/22   Hoy Register, MD  glucose blood (ACCU-CHEK GUIDE) test strip USE TO CHECK BLOOD SUGAR 3 TIMES DAILY 04/03/22   Hoy Register, MD  torsemide (DEMADEX) 100 MG tablet Take 100 mg by mouth See admin instructions.  Take Sunday,Monday, Wednesday and Friday Patient not taking: Reported on 03/27/2023 04/02/22   [provider]      Allergies    Bee pollen, Bee venom, Sulfa antibiotics, and Iodinated contrast media    Review of Systems   Review of Systems  Physical Exam Updated Vital Signs BP 112/86   Pulse (!) 131   Temp 97.6 F (36.4 C)   Resp (!) 22   SpO2 97%  Physical Exam Constitutional:      General: She is not in acute distress. HENT:     Head: Normocephalic and atraumatic.  Eyes:     Conjunctiva/sclera: Conjunctivae normal.     Pupils: Pupils are equal, round, and reactive to light.  Cardiovascular:     Rate and Rhythm: Tachycardia present. Rhythm irregular.  Pulmonary:     Effort: Pulmonary effort is normal. No respiratory distress.  Abdominal:     General: There is no distension.     Tenderness: There is no abdominal tenderness.  Skin:    General: Skin is warm and dry.  Neurological:     General: No focal deficit present.     Mental Status: She is alert. Mental status is at baseline.  Psychiatric:        Mood and Affect: Mood normal.        Behavior: Behavior normal.     ED Results / Procedures /  Treatments   Labs (all labs ordered are listed, but only abnormal results are displayed) Labs Reviewed  SARS CORONAVIRUS 2 BY RT PCR - Abnormal; Notable for the following components:      Result Value   SARS Coronavirus 2 by RT PCR POSITIVE (*)    All other components within normal limits  COMPREHENSIVE METABOLIC PANEL - Abnormal; Notable for the following components:   Potassium 3.4 (*)    Chloride 95 (*)    Glucose, Bld 113 (*)    Creatinine, Ser 6.39 (*)    Total Protein 8.2 (*)    GFR, Estimated 7 (*)    All other components within normal limits  CBC WITH DIFFERENTIAL/PLATELET - Abnormal; Notable for the following components:  RBC 3.41 (*)    Hemoglobin 10.4 (*)    HCT 33.5 (*)    RDW 16.1 (*)    nRBC 0.3 (*)    All other components within normal limits  LIPASE, BLOOD  TROPONIN I (HIGH SENSITIVITY)    EKG None  Radiology DG Chest Portable 1 View  Result Date: 03/27/2023 CLINICAL DATA:  Chest pain EXAM: PORTABLE CHEST 1 VIEW COMPARISON:  X-ray 03/14/2023 FINDINGS: Underinflated x-ray. Sternal wires. Atrial occlusion clip. Prosthetic valve. Right IJ double-lumen catheter with tip along the right atrium. Enlarged heart with vascular congestion. Question trace edema. No pneumothorax or effusion. Eventration of the right hemidiaphragm. No pneumothorax. Overlapping cardiac leads. IMPRESSION: Postop chest. Enlarged heart with vascular congestion and trace edema. Right IJ catheter. Electronically Signed   By: Karen Kays M.D.   On: 03/27/2023 10:30    Procedures Procedures    Medications Ordered in ED Medications  promethazine (PHENERGAN) 12.5 mg in sodium chloride 0.9 % 50 mL IVPB (12.5 mg Intravenous New Bag/Given 03/27/23 1223)  midodrine (PROAMATINE) tablet 10 mg (10 mg Oral Not Given 03/27/23 1225)  amiodarone (PACERONE) tablet 200 mg (200 mg Oral Not Given 03/27/23 1224)  sodium chloride 0.9 % bolus 500 mL (500 mLs Intravenous New Bag/Given 03/27/23 1014)  ondansetron (ZOFRAN)  injection 4 mg (4 mg Intravenous Given 03/27/23 1009)    ED Course/ Medical Decision Making/ A&P Clinical Course as of 03/27/23 1237  Tue Mar 27, 2023  1101 Pt still having persistent vomiting after zofran, now with 2 large diffusely watery diarrhea episodes.  Suspect likely viral gastroenteritis.  High risk of dehydration/hypotension - anticipating medical admission after labs return.  At this time there is no indication for emergent dialysis [MT]  1128 Admitted to hospitalist Dr Debby Bud [MT]  1236 SARS Coronavirus 2 by RT PCR(!): POSITIVE [MT]    Clinical Course User Index [MT] , Kermit Balo, MD                                 Medical Decision Making Amount and/or Complexity of Data Reviewed Labs: ordered. Decision-making details documented in ED Course. Radiology: ordered. ECG/medicine tests: ordered.  Risk Prescription drug management. Decision regarding hospitalization.   This patient presents to the ED with concern for chest discomfort, nausea. This involves an extensive number of treatment options, and is a complaint that carries with it a high risk of complications and morbidity.  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  Multiple cardioversions for A FIb  I ordered and personally interpreted labs.  The pertinent results include: Labs are chronic levels, no leukocytosis, hemoglobin near baseline at 10.4.  Potassium 3.4.  I ordered imaging studies including dg chest I independently visualized  and interpreted imaging which showed some mild pulmonary congestion I agree with the radiologist interpretation  The patient was maintained on a cardiac monitor.  I personally viewed and interpreted the cardiac monitored which showed an underlying rhythm of: A-fib with RVR  Per my interpretation the patient's ECG shows A-fib with RVR  I ordered medication including IV nausea medication, midodrine and amiodarone per home hypotension and A-fib management plan.  Small fluid bolus 500 cc of borderline hypotension.  I have reviewed the patients home medicines and have made adjustments as needed  Test Considered: I have a low suspicion for PE, or acute intra-abdominal bacterial or surgical emergency to warrant CT imaging at this time.  After the interventions noted above, I reevaluated the patient and found that they have: stayed the same  Dispostion:  After consideration of the diagnostic results and the patients response to treatment, I feel that the patent would benefit from medical admission.  The patient has a history of borderline hypotension per her last hospitalization record, and I suspect her blood pressure is near baseline, with a MAP still over 65.  There is no indication for vasopressors or ICU admission.  The patient also has a history of persistent longstanding A-fib, and I suspect now that the tachycardia is related to the vomiting and the illness.  This appears to be a viral gastroenteritis.  We will need to balance volume resuscitation and blood pressure control with heart rate control.   Update - Covid positive - likely cause of symptoms    Final Clinical Impression(s) / ED Diagnoses Final diagnoses:  Atrial fibrillation with RVR (HCC)  Nausea vomiting and diarrhea  COVID-19    Rx / DC Orders ED Discharge Orders     None         , Kermit Balo, MD 03/27/23 1220    Terald Sleeper, MD 03/27/23 [phone]

## 2023-03-27 NOTE — Assessment & Plan Note (Addendum)
Patient followed by cardiology as outpatient.   Plan Medical tele admit due to rate control Continue current medication regimen

## 2023-03-28 DIAGNOSIS — U071 COVID-19: Secondary | ICD-10-CM

## 2023-03-28 DIAGNOSIS — K529 Noninfective gastroenteritis and colitis, unspecified: Secondary | ICD-10-CM

## 2023-03-28 HISTORY — DX: COVID-19: U07.1

## 2023-03-28 LAB — PHOSPHORUS: Phosphorus: 5.1 mg/dL — ABNORMAL HIGH (ref 2.5–4.6)

## 2023-03-28 LAB — GLUCOSE, CAPILLARY
Glucose-Capillary: 104 mg/dL — ABNORMAL HIGH (ref 70–99)
Glucose-Capillary: 104 mg/dL — ABNORMAL HIGH (ref 70–99)
Glucose-Capillary: 116 mg/dL — ABNORMAL HIGH (ref 70–99)
Glucose-Capillary: 128 mg/dL — ABNORMAL HIGH (ref 70–99)
Glucose-Capillary: 91 mg/dL (ref 70–99)
Glucose-Capillary: 99 mg/dL (ref 70–99)

## 2023-03-28 LAB — ALBUMIN: Albumin: 3.3 g/dL — ABNORMAL LOW (ref 3.5–5.0)

## 2023-03-28 LAB — HEPATITIS B SURFACE ANTIGEN: Hepatitis B Surface Ag: NONREACTIVE

## 2023-03-28 MED ORDER — METOPROLOL TARTRATE 5 MG/5ML IV SOLN
5.0000 mg | INTRAVENOUS | Status: DC | PRN
  Administered 2023-03-28: 5 mg via INTRAVENOUS
  Filled 2023-03-28: qty 5

## 2023-03-28 MED ORDER — TRIMETHOBENZAMIDE HCL 100 MG/ML IM SOLN: 200.0000 mg | Freq: Three times a day (TID) | INTRAMUSCULAR | Status: DC | PRN

## 2023-03-28 MED ORDER — DILTIAZEM HCL-DEXTROSE 125-5 MG/125ML-% IV SOLN (PREMIX)
5.0000 mg/h | INTRAVENOUS | Status: DC
  Filled 2023-03-28: qty 125

## 2023-03-28 MED ORDER — DILTIAZEM LOAD VIA INFUSION
10.0000 mg | Freq: Once | INTRAVENOUS | Status: DC
  Filled 2023-03-28: qty 10

## 2023-03-28 MED ORDER — TRIMETHOBENZAMIDE HCL 100 MG/ML IM SOLN
200.0000 mg | Freq: Two times a day (BID) | INTRAMUSCULAR | Status: DC | PRN
  Administered 2023-03-28: 200 mg via INTRAMUSCULAR
  Filled 2023-03-28 (×3): qty 2

## 2023-03-28 MED ORDER — DOXERCALCIFEROL 4 MCG/2ML IV SOLN
2.0000 ug | INTRAVENOUS | Status: DC
  Administered 2023-03-30: 2 ug via INTRAVENOUS
  Filled 2023-03-28 (×2): qty 2

## 2023-03-28 MED ORDER — PROCHLORPERAZINE EDISYLATE 10 MG/2ML IJ SOLN
10.0000 mg | INTRAMUSCULAR | Status: DC | PRN
  Administered 2023-03-28 – 2023-03-30 (×4): 10 mg via INTRAVENOUS
  Filled 2023-03-28 (×4): qty 2

## 2023-03-28 NOTE — Progress Notes (Addendum)
TR[facility] night cross cover note:   I was notified by RN that this patient, who has history of chronic atrial fibrillation, has been experiencing heart rates in the 120s to 140s, now sustaining into the 190's.  No significant change in blood pressure, with RN conveying most recent blood pressure of 109/76.  Initially, patient without any new symptoms associated with this relative increase in heart rate.   Per my brief chart review, she has a history of chronic atrial fibrillation and is chronically anticoagulated on Eliquis.  She is also on amiodarone, but does not appear to be on any AV nodal blocking agents.  Per my brief chart review, she has a history of chronic diastolic heart failure, with most recent echo in July 2023 reported to convey LVEF of 50 to 55%.  She is here with acute COVID-19 infection, which this suspected to be a driving force behind her RVR superimposed on chronic atrial fibrillation.   Will continue to treat this underlying COVID-19 infection, while also striving for improvement in rate control. I have subsequently ordered IV diltiazem bolus followed by initiation of diltiazem drip.  It is noted that she has ESRD on HD, and is next scheduled for HD in the morning (8/8).     Additionally, as the patient is currently med-tele status, with plan to start diltiazem drip, I will transfer pt to progressive unit. In order to achieve transfer to progressive unit, I have placed the following 3 orders:     1) order to transfer patient to progressive unit 2) order to change level of care to progressive 3) a new admission order to admit to progressive   I have communicated the above plan to transfer patient to progressive unit with the patient's RN.    Update: prior to administration of the iv Diltiazem bolus/drip, heart rate has improved into the 90s to low 100s, sustaining in that range.  Consequently, I have discontinued orders for the diltiazem bolus as well as the diltiazem drip.   Will continue with the existing order for as needed IV Lopressor for sustained heart rate greater than 130 bpm.     Newton Pigg, DO Hospitalist

## 2023-03-28 NOTE — Progress Notes (Signed)
Pt said does not wear cpap at home. Refused

## 2023-03-28 NOTE — Progress Notes (Signed)
Pt refused AM labs

## 2023-03-28 NOTE — Progress Notes (Signed)
PROGRESS NOTE    [name]  ZOX:[id number] DOB: Dec 24, 1955 DOA: 03/27/2023 PCP: Grayce Sessions, NP     Brief Narrative:  [name] is a 67 y.o. female with history of end-stage renal disease on dialysis TTSa, HFrEF, chronic atrial fibrillation, DM2, HTN, gastroparesis, GERD, OSA on CPAP.  Patient reports she felt okay yesterday but woke up this morning feeling extremely nauseated and weak.  She went to dialysis and completed nearly half of her session when she began to feel extremely nauseated and vomited. She has also being having loose watery stools. EMS was activated.  EMT reports the  patient was in A-fib with RVR, heart rate improved to about 100 bpm average en route to the hospital.  She was given 1 dose of Zofran with no improvement of her symptoms.  The patient reports she is to continue to feel extremely nauseated, continues to have vomiting and loose stools.  She reports some chest discomfort.  She denies fever, headache, sore throat.  Denies sick contacts in the house.  Patient tested positive for COVID in the emergency department.  Patient was admitted for further evaluation and treatment for gastroenteritis.  New events last 24 hours / Subjective: Patient continues to have nausea, vomiting and diarrhea this morning as well as abdominal cramping.  No shortness of breath.  Assessment & Plan:   Principal Problem:   Gastroenteritis Active Problems:   Viral gastroenteritis   Chronic atrial fibrillation (HCC)   ESRD on dialysis (HCC)   Type 2 diabetes mellitus without complications (HCC)   Gastroparesis   GERD (gastroesophageal reflux disease)   OSA on CPAP   Essential hypertension   COPD (chronic obstructive pulmonary disease) (HCC)   HFrEF (heart failure with reduced ejection fraction) (HCC)   Gastroenteritis -Could be secondary to COVID, versus other -GI PCR pending -Symptom management with supportive care  COVID-19 -Tested positive  8/6 -Supportive care  Chronic A-fib, with RVR on admission -Eliquis, amiodarone  -Rate improved  ESRD -HD TTSa -Nephrology following  Diabetes mellitus -A1c 6 -Sliding scale insulin  Hypertension  COPD -Without exacerbation  Chronic diastolic HF -Without exacerbation, last echo showed EF 50-55%   OSA on CPAP -CPAP nightly  GERD -PPI  DVT prophylaxis:  apixaban (ELIQUIS) tablet 5 mg  Code Status: Full Family Communication: none at bedside Disposition Plan: Home Status is: Inpatient Remains inpatient appropriate because: N/V/D, covid     Antimicrobials:  Anti-infectives (From admission, onward)    None        Objective: Vitals:   03/27/23 2204 03/28/23 0433 03/28/23 0758 03/28/23 1158  BP: 105/70 125/73 104/83 122/79  Pulse: (!) 133 (!) 116 (!) 117 (!) 115  Resp: 16 18 18 18   Temp: 98.6 F (37 C) 98 F (36.7 C) 98 F (36.7 C) (!) 97.5 F (36.4 C)  TempSrc: Oral Oral Oral Oral  SpO2: 98% 100% 100% 99%  Weight:      Height:        Intake/Output Summary (Last 24 hours) at 03/28/2023 1415 Last data filed at 03/27/2023 2330 Gross per 24 hour  Intake 710.6 ml  Output 1 ml  Net 709.6 ml   Filed Weights   03/27/23 1841  Weight: 126.8 kg    Examination:  General exam: Appears calm and comfortable  Respiratory system: Clear to auscultation. Respiratory effort normal. No respiratory distress. No conversational dyspnea.  Cardiovascular system: S1 & S2 heard, tachycardic. No murmurs. No pedal edema. Gastrointestinal system: Abdomen is  nondistended, soft, mildly TTP mid-abdomen  Central nervous system: Alert and oriented. No focal neurological deficits. Speech clear.  Extremities: Symmetric in appearance  Skin: No rashes, lesions or ulcers on exposed skin  Psychiatry: Judgement and insight appear normal. Mood & affect appropriate.   Data Reviewed: I have personally reviewed following labs and imaging studies  CBC: Recent Labs  Lab 03/27/23 0946   WBC 7.7  NEUTROABS 6.5  HGB 10.4*  HCT 33.5*  MCV 98.2  PLT 167   Basic Metabolic Panel: Recent Labs  Lab 03/27/23 0946  NA 136  K 3.4*  CL 95*  CO2 27  GLUCOSE 113*  BUN 22  CREATININE 6.39*  CALCIUM 9.4   GFR: Estimated Creatinine Clearance: 12 mL/min (A) (by C-G formula based on SCr of 6.39 mg/dL (H)). Liver Function Tests: Recent Labs  Lab 03/27/23 0946  AST 17  ALT 12  ALKPHOS 75  BILITOT 0.9  PROT 8.2*  ALBUMIN 3.8   Recent Labs  Lab 03/27/23 0946  LIPASE 29   No results for input(s): "AMMONIA" in the last 168 hours. Coagulation Profile: No results for input(s): "INR", "PROTIME" in the last 168 hours. Cardiac Enzymes: No results for input(s): "CKTOTAL", "CKMB", "CKMBINDEX", "TROPONINI" in the last 168 hours. BNP (last 3 results) No results for input(s): "PROBNP" in the last 8760 hours. HbA1C: No results for input(s): "HGBA1C" in the last 72 hours. CBG: Recent Labs  Lab 03/27/23 1743 03/27/23 2358 03/28/23 0435 03/28/23 0755 03/28/23 1154  GLUCAP 113* 128* 104* 104* 91   Lipid Profile: No results for input(s): "CHOL", "HDL", "LDLCALC", "TRIG", "CHOLHDL", "LDLDIRECT" in the last 72 hours. Thyroid Function Tests: No results for input(s): "TSH", "T4TOTAL", "FREET4", "T3FREE", "THYROIDAB" in the last 72 hours. Anemia Panel: No results for input(s): "VITAMINB12", "FOLATE", "FERRITIN", "TIBC", "IRON", "RETICCTPCT" in the last 72 hours. Sepsis Labs: No results for input(s): "PROCALCITON", "LATICACIDVEN" in the last 168 hours.  Recent Results (from the past 240 hour(s))  SARS Coronavirus 2 by RT PCR (hospital order, performed in [facility] hospital lab) *cepheid single result test* Anterior Nasal Swab     Status: Abnormal   Collection Time: 03/27/23  9:46 AM   Specimen: Anterior Nasal Swab  Result Value Ref Range Status   SARS Coronavirus 2 by RT PCR POSITIVE (A) NEGATIVE Final    Comment: Performed at [facility], [address],  [place], [place] [address]      Radiology Studies: CT Head Wo Contrast  Result Date: 03/27/2023 CLINICAL DATA:  Altered mental status EXAM: CT HEAD WITHOUT CONTRAST TECHNIQUE: Contiguous axial images were obtained from the base of the skull through the vertex without intravenous contrast. RADIATION DOSE REDUCTION: This exam was performed according to the departmental dose-optimization program which includes automated exposure control, adjustment of the mA and/or kV according to patient size and/or use of iterative reconstruction technique. COMPARISON:  03/14/2023 FINDINGS: Brain: No acute intracranial findings are seen in noncontrast CT brain. There are no signs of bleeding within the cranium. Ventricles are not dilated. There is no focal edema or mass effect. Vascular: Unremarkable. Skull: Hyperostosis frontalis interna is seen. No acute findings are seen. Sinuses/Orbits: Unremarkable. Other: None. IMPRESSION: No acute intracranial findings are seen in noncontrast CT brain. Electronically Signed   By: Ernie Avena M.D.   On: 03/27/2023 14:23   DG Chest Portable 1 View  Result Date: 03/27/2023 CLINICAL DATA:  Chest pain EXAM: PORTABLE CHEST 1 VIEW COMPARISON:  X-ray 03/14/2023 FINDINGS: Underinflated x-ray. Sternal  wires. Atrial occlusion clip. Prosthetic valve. Right IJ double-lumen catheter with tip along the right atrium. Enlarged heart with vascular congestion. Question trace edema. No pneumothorax or effusion. Eventration of the right hemidiaphragm. No pneumothorax. Overlapping cardiac leads. IMPRESSION: Postop chest. Enlarged heart with vascular congestion and trace edema. Right IJ catheter. Electronically Signed   By: Karen Kays M.D.   On: 03/27/2023 10:30      Scheduled Meds:  amiodarone  200 mg Oral Daily   apixaban  5 mg Oral BID   busPIRone  5 mg Oral BID   colchicine  0.3 mg Oral Daily   [START ON 03/29/2023] doxercalciferol  2 mcg Intravenous Q T,Th,Sa-HD   DULoxetine  60 mg Oral  Daily   feeding supplement  1 Container Oral TID BM   insulin aspart  0-20 Units Subcutaneous TID WC   [START ON 03/29/2023] lidocaine-prilocaine  1 Application Topical Q T,Th,Sa-HD   loratadine  10 mg Oral Daily   midodrine  10 mg Oral Once   midodrine  10 mg Oral Q T,Th,Sat-1800   pantoprazole  40 mg Oral Daily   rosuvastatin  10 mg Oral Daily   Continuous Infusions:  sodium chloride 75 mL/hr at 03/28/23 1121     LOS: 1 day   Time spent: 35 minutes   Noralee Stain, DO [facility] 03/28/2023, 2:15 PM   Available via Epic secure chat 7am-7pm After these hours, please refer to coverage provider listed on [email or web address]

## 2023-03-28 NOTE — Progress Notes (Signed)
   03/28/23 1200  Spiritual Encounters  Type of Visit Initial  Care provided to: Patient  Conversation partners present during encounter Nurse  Referral source Patient request  Reason for visit Routine spiritual support  OnCall Visit No  Spiritual Framework  Presenting Themes Meaning/purpose/sources of inspiration  Values/beliefs Faith  Community/Connection Faith community  Patient Stress Factors Family relationships;Health changes  Goals  Self/Personal Goals pt wants to bring peace in her family  Interventions  Spiritual Care Interventions Made Established relationship of care and support;Compassionate presence;Reflective listening;Normalization of emotions;Meaning making;Narrative/life review  Intervention Outcomes  Outcomes Awareness of health;Awareness of support;Reduced isolation  Spiritual Care Plan  Spiritual Care Issues Still Outstanding No further spiritual care needs at this time (see row info)   Ch responded to request for spiritual and emotional support. There was no family present at bedside. Pt's faith is very important for her. Ch explored questions about faith and values. Pt love her family and desires that they would live in peace and harmony. Her daughters live with her at home. They are great support for her. Ch encouraged storytelling and accompanied pt in singing songs of praise and deliverance. Ch offered a word of prayer. No follow-up needed at this time.

## 2023-03-28 NOTE — Progress Notes (Signed)
Contacted OC about pt Hr in 120-140 and sustaining in 190. OC pu torder in for diltiazem drip. Due to this status change pt will need to be transferred to progressive floor.

## 2023-03-28 NOTE — Plan of Care (Signed)

## 2023-03-29 DIAGNOSIS — I482 Chronic atrial fibrillation, unspecified: Secondary | ICD-10-CM | POA: Diagnosis not present

## 2023-03-29 DIAGNOSIS — K529 Noninfective gastroenteritis and colitis, unspecified: Secondary | ICD-10-CM | POA: Diagnosis not present

## 2023-03-29 DIAGNOSIS — N186 End stage renal disease: Secondary | ICD-10-CM | POA: Diagnosis not present

## 2023-03-29 DIAGNOSIS — U071 COVID-19: Secondary | ICD-10-CM | POA: Diagnosis not present

## 2023-03-29 LAB — RENAL FUNCTION PANEL
Albumin: 3.4 g/dL — ABNORMAL LOW (ref 3.5–5.0)
Anion gap: 14 (ref 5–15)
BUN: 51 mg/dL — ABNORMAL HIGH (ref 8–23)
CO2: 25 mmol/L (ref 22–32)
Calcium: 9 mg/dL (ref 8.9–10.3)
Chloride: 93 mmol/L — ABNORMAL LOW (ref 98–111)
Creatinine, Ser: 10.54 mg/dL — ABNORMAL HIGH (ref 0.44–1.00)
GFR, Estimated: 4 mL/min — ABNORMAL LOW (ref >60)
Glucose, Bld: 102 mg/dL — ABNORMAL HIGH (ref 70–99)
Phosphorus: 5.1 mg/dL — ABNORMAL HIGH (ref 2.5–4.6)
Potassium: 3.9 mmol/L (ref 3.5–5.1)
Sodium: 132 mmol/L — ABNORMAL LOW (ref 135–145)

## 2023-03-29 LAB — CBC
HCT: 30.6 % — ABNORMAL LOW (ref 36.0–46.0)
Hemoglobin: 9.7 g/dL — ABNORMAL LOW (ref 12.0–15.0)
MCH: 31.5 pg (ref 26.0–34.0)
MCHC: 31.7 g/dL (ref 30.0–36.0)
MCV: 99.4 fL (ref 80.0–100.0)
Platelets: 158 10*3/uL (ref 150–400)
RBC: 3.08 MIL/uL — ABNORMAL LOW (ref 3.87–5.11)
RDW: 16.2 % — ABNORMAL HIGH (ref 11.5–15.5)
WBC: 7.8 10*3/uL (ref 4.0–10.5)
nRBC: 0 % (ref 0.0–0.2)

## 2023-03-29 LAB — GLUCOSE, CAPILLARY
Glucose-Capillary: 81 mg/dL (ref 70–99)
Glucose-Capillary: 94 mg/dL (ref 70–99)
Glucose-Capillary: 79 mg/dL (ref 70–99)
Glucose-Capillary: 87 mg/dL (ref 70–99)

## 2023-03-29 MED ORDER — MENTHOL 3 MG MT LOZG: 1.0000 | LOZENGE | OROMUCOSAL | Status: DC | PRN

## 2023-03-29 MED ORDER — GUAIFENESIN-DM 100-10 MG/5ML PO SYRP
5.0000 mL | ORAL_SOLUTION | ORAL | Status: DC | PRN
  Administered 2023-03-30: 5 mL via ORAL
  Filled 2023-03-29: qty 5

## 2023-03-29 MED ORDER — HEPARIN SODIUM (PORCINE) 1000 UNIT/ML DIALYSIS
2500.0000 [IU] | Freq: Once | INTRAMUSCULAR | Status: AC
  Administered 2023-03-29: 2500 [IU] via INTRAVENOUS_CENTRAL
  Filled 2023-03-29: qty 3

## 2023-03-29 NOTE — Progress Notes (Signed)
2030H - HD started at bedside with HD RN.

## 2023-03-29 NOTE — Progress Notes (Signed)
Pt receives out-pt HD at [facility] [place] on TTS. Will assist as needed.   Olivia Canter Renal Navigator [phone]

## 2023-03-29 NOTE — Progress Notes (Signed)
[facility] Progress Note  Subjective: pt seen in room, no new c/o's.  Still feeling rough. No sob or cough.   Vitals:   03/29/23 0426 03/29/23 0804 03/29/23 1247 03/29/23 1300  BP: 108/62 111/76 119/76   Pulse: (!) 106 (!) 105 (!) 115 99  Resp: 18 20 18    Temp: 97.8 F (36.6 C) 98.2 F (36.8 C) 97.9 F (36.6 C)   TempSrc: Oral Oral Oral   SpO2: 98% 100% 99% 92%  Weight:      Height:        Exam: Gen alert, no distress,appears weak Sclera anicteric, throat clear  No jvd or bruits Chest clear bilat to bases RRR no RG Abd soft obese ntnd no mass or ascites +bs Ext no LE edema Neuro is alert, Ox 3 , nf    RIJ TDC/  L AVF+bruit          Home meds include - albuterol, amiodarone, apixaban, buspirone, colchicine 0.3 every day, dulaglutide, duloxetine, epi pen, insulin lispro, emla cream, loratadine, meclizine, midodrine 10mg  pre hd tts, mvi, sl ntg, ondansetron, pantoprazole, rosuvastatin, torsemide 100mg  non hd days         OP HD: SW TTS  4h  400/600   125.2kg  3K/2.5Ca bath TDC/ AVF   Heparin 2500 - last OP HD 8/06, post wt 128.5kg   - last 4 sessions coming off 2-3kg over - hectorol 2 mcg IV three times per week - venofer 50mg  IV weekly - mircera 75 mcg IV q 2 wks, last 8/06, due 8/20     Assessment/ Plan: Vomiting/ diarrhea/ abd pain - suspected viral GE, possibly related to COVID 19. Per pmd.  ESRD - on HD TTS. Had 2 hrs of HD at OP unit on 8/06. HD tonight.  HTN/ volume - BP's low-normal, not on BP lowering meds and takes midodrine pre HD 10mg  tiw. Is up now 4-5kg. Avoid IVF's.  UF goal 2-3 L w/ HD tonight.  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 Cont IV vdra. Get binder info DM2 - on insulin  Covid 19+ - per pmd Chronic afib  COPD      Vinson Moselle MD  [facility] 03/29/2023, 4:42 PM  Recent Labs  Lab 03/27/23 0946 03/28/23 1409  HGB 10.4*  --   ALBUMIN 3.8 3.3*  CALCIUM 9.4  --   PHOS  --  5.1*  CREATININE  6.39*  --   K 3.4*  --    No results for input(s): "IRON", "TIBC", "FERRITIN" in the last 168 hours. Inpatient medications:  amiodarone  200 mg Oral Daily   apixaban  5 mg Oral BID   busPIRone  5 mg Oral BID   colchicine  0.3 mg Oral Daily   doxercalciferol  2 mcg Intravenous Q T,Th,Sa-HD   DULoxetine  60 mg Oral Daily   feeding supplement  1 Container Oral TID BM   insulin aspart  0-20 Units Subcutaneous TID WC   lidocaine-prilocaine  1 Application Topical Q T,Th,Sa-HD   loratadine  10 mg Oral Daily   midodrine  10 mg Oral Q T,Th,Sat-1800   pantoprazole  40 mg Oral Daily   rosuvastatin  10 mg Oral Daily    acetaminophen, albuterol, diphenoxylate-atropine, guaiFENesin-dextromethorphan, meclizine, menthol-cetylpyridinium, metoprolol tartrate, nitroGLYCERIN, prochlorperazine

## 2023-03-29 NOTE — Progress Notes (Signed)
PROGRESS NOTE    [name]  FIE:[id number] DOB: 1956-03-18 DOA: 03/27/2023 PCP: Grayce Sessions, NP     Brief Narrative:  [name] is a 67 y.o. female with history of end-stage renal disease on dialysis TTSa, HFrEF, chronic atrial fibrillation, DM2, HTN, gastroparesis, GERD, OSA on CPAP.  Patient reports she felt okay yesterday but woke up this morning feeling extremely nauseated and weak.  She went to dialysis and completed nearly half of her session when she began to feel extremely nauseated and vomited. She has also being having loose watery stools. EMS was activated.  EMT reports the  patient was in A-fib with RVR, heart rate improved to about 100 bpm average en route to the hospital.  She was given 1 dose of Zofran with no improvement of her symptoms.  The patient reports she is to continue to feel extremely nauseated, continues to have vomiting and loose stools.  She reports some chest discomfort.  She denies fever, headache, sore throat.  Denies sick contacts in the house.  Patient tested positive for COVID in the emergency department.  Patient was admitted for further evaluation and treatment for gastroenteritis.  New events last 24 hours / Subjective: Patient went into A-fib RVR overnight.  She was transferred to progressive unit, started on IV diltiazem with heart rate improvement.  Her abdominal complaints have improved.  Was able to tolerate breakfast this morning.  Having some anxiety due to being on the sixth floor.  Has the window shades closed, holding onto her bed  Assessment & Plan:   Principal Problem:   Gastroenteritis Active Problems:   Viral gastroenteritis   Chronic atrial fibrillation (HCC)   ESRD on dialysis (HCC)   Type 2 diabetes mellitus without complications (HCC)   Gastroparesis   GERD (gastroesophageal reflux disease)   OSA on CPAP   Essential hypertension   COPD (chronic obstructive pulmonary disease) (HCC)   HFrEF (heart  failure with reduced ejection fraction) (HCC)   COVID-19   Gastroenteritis -Could be secondary to COVID, versus other -GI PCR negative -Symptom management with supportive care -Improving  COVID-19 -Tested positive 8/6 -Supportive care  Chronic A-fib, with RVR  -Eliquis, amiodarone  -Consult cardiology, patient follows with Dr. Cristal Deer as outpatient  ESRD -HD TTSa -Nephrology following  Diabetes mellitus -A1c 6 -Sliding scale insulin  COPD -Without exacerbation  Chronic diastolic HF -Without exacerbation, last echo showed EF 50-55%   OSA on CPAP -CPAP nightly  GERD -PPI  DVT prophylaxis:  apixaban (ELIQUIS) tablet 5 mg  Code Status: Full Family Communication: none at bedside Disposition Plan: Home Status is: Inpatient Remains inpatient appropriate because: A Fib RVR     Antimicrobials:  Anti-infectives (From admission, onward)    None        Objective: Vitals:   03/29/23 0029 03/29/23 0426 03/29/23 0804 03/29/23 1247  BP: 105/66 108/62 111/76 119/76  Pulse: 100 (!) 106 (!) 105 (!) 115  Resp: 18 18 20 18   Temp: 98.6 F (37 C) 97.8 F (36.6 C) 98.2 F (36.8 C) 97.9 F (36.6 C)  TempSrc: Oral Oral Oral Oral  SpO2: 96% 98% 100% 99%  Weight: 130.9 kg     Height:        Intake/Output Summary (Last 24 hours) at 03/29/2023 1439 Last data filed at 03/29/2023 1236 Gross per 24 hour  Intake 2521.27 ml  Output --  Net 2521.27 ml   Filed Weights   03/27/23 1841 03/29/23 0029  Weight:  126.8 kg 130.9 kg    Examination:  General exam: Appears calm and comfortable  Respiratory system: Clear to auscultation. Respiratory effort normal. No respiratory distress. No conversational dyspnea.  Cardiovascular system: S1 & S2 heard irreg rhythm. No murmurs. No pedal edema. Gastrointestinal system: Abdomen is nondistended, soft, mildly TTP mid-abdomen  Central nervous system: Alert and oriented. No focal neurological deficits. Speech clear.  Extremities:  Symmetric in appearance  Skin: No rashes, lesions or ulcers on exposed skin  Psychiatry: Judgement and insight appear normal. Mood & affect appropriate.   Data Reviewed: I have personally reviewed following labs and imaging studies  CBC: Recent Labs  Lab 03/27/23 0946  WBC 7.7  NEUTROABS 6.5  HGB 10.4*  HCT 33.5*  MCV 98.2  PLT 167   Basic Metabolic Panel: Recent Labs  Lab 03/27/23 0946 03/28/23 1409  NA 136  --   K 3.4*  --   CL 95*  --   CO2 27  --   GLUCOSE 113*  --   BUN 22  --   CREATININE 6.39*  --   CALCIUM 9.4  --   PHOS  --  5.1*   GFR: Estimated Creatinine Clearance: 12.2 mL/min (A) (by C-G formula based on SCr of 6.39 mg/dL (H)). Liver Function Tests: Recent Labs  Lab 03/27/23 0946 03/28/23 1409  AST 17  --   ALT 12  --   ALKPHOS 75  --   BILITOT 0.9  --   PROT 8.2*  --   ALBUMIN 3.8 3.3*   Recent Labs  Lab 03/27/23 0946  LIPASE 29   No results for input(s): "AMMONIA" in the last 168 hours. Coagulation Profile: No results for input(s): "INR", "PROTIME" in the last 168 hours. Cardiac Enzymes: No results for input(s): "CKTOTAL", "CKMB", "CKMBINDEX", "TROPONINI" in the last 168 hours. BNP (last 3 results) No results for input(s): "PROBNP" in the last 8760 hours. HbA1C: No results for input(s): "HGBA1C" in the last 72 hours. CBG: Recent Labs  Lab 03/28/23 1154 03/28/23 1702 03/28/23 2048 03/29/23 0809 03/29/23 1254  GLUCAP 91 116* 99 81 94   Lipid Profile: No results for input(s): "CHOL", "HDL", "LDLCALC", "TRIG", "CHOLHDL", "LDLDIRECT" in the last 72 hours. Thyroid Function Tests: No results for input(s): "TSH", "T4TOTAL", "FREET4", "T3FREE", "THYROIDAB" in the last 72 hours. Anemia Panel: No results for input(s): "VITAMINB12", "FOLATE", "FERRITIN", "TIBC", "IRON", "RETICCTPCT" in the last 72 hours. Sepsis Labs: No results for input(s): "PROCALCITON", "LATICACIDVEN" in the last 168 hours.  Recent Results (from the past 240  hour(s))  SARS Coronavirus 2 by RT PCR (hospital order, performed in [facility] hospital lab) *cepheid single result test* Anterior Nasal Swab     Status: Abnormal   Collection Time: 03/27/23  9:46 AM   Specimen: Anterior Nasal Swab  Result Value Ref Range Status   SARS Coronavirus 2 by RT PCR POSITIVE (A) NEGATIVE Final    Comment: Performed at [facility], [address] [place], [place] [address]  Gastrointestinal Panel by PCR , Stool     Status: None   Collection Time: 03/28/23 12:56 PM   Specimen: Stool  Result Value Ref Range Status   Campylobacter species NOT DETECTED NOT DETECTED Final   Plesimonas shigelloides NOT DETECTED NOT DETECTED Final   Salmonella species NOT DETECTED NOT DETECTED Final   Yersinia enterocolitica NOT DETECTED NOT DETECTED Final   Vibrio species NOT DETECTED NOT DETECTED Final   Vibrio cholerae NOT DETECTED NOT DETECTED Final   Enteroaggregative  E coli (EAEC) NOT DETECTED NOT DETECTED Final   Enteropathogenic E coli (EPEC) NOT DETECTED NOT DETECTED Final   Enterotoxigenic E coli (ETEC) NOT DETECTED NOT DETECTED Final   Shiga like toxin producing E coli (STEC) NOT DETECTED NOT DETECTED Final   Shigella/Enteroinvasive E coli (EIEC) NOT DETECTED NOT DETECTED Final   Cryptosporidium NOT DETECTED NOT DETECTED Final   Cyclospora cayetanensis NOT DETECTED NOT DETECTED Final   Entamoeba histolytica NOT DETECTED NOT DETECTED Final   Giardia lamblia NOT DETECTED NOT DETECTED Final   Adenovirus F40/41 NOT DETECTED NOT DETECTED Final   Astrovirus NOT DETECTED NOT DETECTED Final   Norovirus GI/GII NOT DETECTED NOT DETECTED Final   Rotavirus A NOT DETECTED NOT DETECTED Final   Sapovirus (I, II, IV, and V) NOT DETECTED NOT DETECTED Final    Comment: Performed at [facility], [address] [place], [place] [address]      Radiology Studies: No results found.    Scheduled Meds:  amiodarone  200 mg Oral Daily   apixaban  5 mg Oral BID    busPIRone  5 mg Oral BID   colchicine  0.3 mg Oral Daily   doxercalciferol  2 mcg Intravenous Q T,Th,Sa-HD   DULoxetine  60 mg Oral Daily   feeding supplement  1 Container Oral TID BM   insulin aspart  0-20 Units Subcutaneous TID WC   lidocaine-prilocaine  1 Application Topical Q T,Th,Sa-HD   loratadine  10 mg Oral Daily   midodrine  10 mg Oral Q T,Th,Sat-1800   pantoprazole  40 mg Oral Daily   rosuvastatin  10 mg Oral Daily   Continuous Infusions:  sodium chloride 75 mL/hr at 03/29/23 1236     LOS: 2 days   Time spent: 25 minutes   Noralee Stain, DO [facility] 03/29/2023, 2:39 PM   Available via Epic secure chat 7am-7pm After these hours, please refer to coverage provider listed on [email or web address]

## 2023-03-29 NOTE — Consult Note (Addendum)
Cardiology Consultation   Patient ID: Jazzlynn Gallagher MRN: [id number]; DOB: 02-Jul-1956  Admit date: 03/27/2023 Date of Consult: 03/29/2023  PCP:  Grayce Sessions, NP   [facility] Providers Cardiologist:  Jodelle Red, MD  Electrophysiologist:  Lanier Prude, MD   Patient Profile:   [name] is a 67 y.o. female with a hx of morbid obesity, chronic combined heart failure, severe MR/TR, longstanding persistent atrial fibrillation on Eliquis, hypertension, diabetes, ESRD on HD who is being seen 03/29/2023 for the evaluation of atrial fibrillation at the request of Dr. Alvino Chapel.  History of Present Illness:   [name] is a 67 year old female with past medical history noted below.  She has known history of heart failure with LVEF of 45 to 50%.  Underwent cardiac catheterization in 2019 with mild nonobstructive CAD.  History of difficult to control atrial fibrillation with failed Tikosyn, later switched to amiodarone, digoxin, metoprolol and apixaban.  Admitted 12/2020 with worsening heart failure.  Right heart cath with elevated filling pressures and low cardiac index.  Her went mitral valve and tricuspid valve repair with maze on 12/2020.  Postoperatively she struggled with low output, progressive renal failure and volume overload.  Postoperative echo showed LVEF of 55%, RV was moderately reduced, moderate TR, no significant MR.  Her serum creatinine at that time was 2.8.  She was also noted to be in atrial fibrillation at the time of discharge.  Readmitted 01/2021 for acute on chronic CHF as well as atrial fibrillation and diuresed with IV Lasix.  Right heart cath showed low filling pressures and moderately reduced cardiac output.  Underwent successful DCCV but hospitalization course was complicated by AKI on CKD.  Presented back several days later with recurrent atrial fibrillation and underwent repeat DCCV 02/09/2021.  Back in atrial fibrillation  02/2021 with repeat echo showing new reduction in LVEF of 30 to 35%, moderate LVH, moderate RV dysfunction.  Repeat DCCV 04/19/2021  Admitted for HF and right heart cath 11/2021 notable for severely elevated biventricular pressures and treated with IV milrinone and Lasix.  Repeat cardioversion to sinus rhythm and loaded with IV amiodarone.  Developed severe stuttering and code stroke called.  CT/MRI normal.  Symptoms spontaneously resolved.  Readmitted 12/2021 for acute on chronic CHF.  TEE during that admission with severe biventricular dysfunction, stable mitral valve prosthesis and severe TR.  At the time of discharge she was referred to structural heart team for consideration of TV VIV.  She was evaluated and felt to not be a candidate for tricuspid valve valve and ring due to markedly elevated wedge pressures during admission 01/2022.  She was last seen in the office 12/2022 with Dr. Cristal Deer reporting her shortness of breath seemed worse, also complained of dizziness.  She was noted to be back in atrial fibrillation.  She is planning for upcoming vascular access with VVS and cleared by advanced heart failure.  In regards to her atrial fibrillation it is noted she has had multiple cardioversions, failed Tikosyn, had a maze.  Following her fistula placement there were plans to discuss possible hybrid ablation, stopping amiodarone or consider other options.  She presented to the ED on 8/6 with complaints of nausea and weakness.  Went to dialysis and completed only half of her session before she began to feel weak, nauseated and vomited.  EMS was called and patient was noted to be in atrial fibrillation with RVR.  In the ED her labs were notable for  sodium 136, potassium 3.4, creatinine 6.3, high-sensitivity troponin 8, WBC 7.7, hemoglobin 10.4.  COVID-positive.  EKG showed atrial fibrillation with RVR, 126 bpm.  Chest x-ray with cardiomegaly, vascular congestion.  She was admitted to internal medicine  for treatment for gastroenteritis, as well as COVID treatment.   The evening of 8/7 she developed atrial fibrillation with RVR with reported heart rates into the 190s.  She was ordered for IV Cardizem with bolus, but heart rates improved to 90s to 100s prior to initiation.  Past Medical History:  Diagnosis Date   Acute on chronic systolic CHF (congestive heart failure) (HCC) 12/21/2020   Allergic rhinitis    Anemia    Arthritis    Asthma    Chronic diastolic CHF (congestive heart failure) (HCC)    Chronic kidney disease    Dialysis Tu/TH/Sa   COPD (chronic obstructive pulmonary disease) (HCC)    Depression    DM (diabetes mellitus) (HCC)    DVT (deep vein thrombosis) in pregnancy    GERD (gastroesophageal reflux disease)    HTN (hypertension)    Hyperlipidemia    Obesity    Pneumonia 04/2018   RIGHT LOBE   Sleep apnea    needs to be retested - to get new cpap    Past Surgical History:  Procedure Laterality Date   ABDOMINAL HYSTERECTOMY     AV FISTULA PLACEMENT Left 03/17/2022   Procedure: LEFT ARM ARTERIOVENOUS (AV) FISTULA CREATION;  Surgeon: Chuck Hint, MD;  Location: [facility] OR;  Service: Vascular;  Laterality: Left;   BIOPSY  01/12/2023   Procedure: BIOPSY;  Surgeon: Jeani Hawking, MD;  Location: [facility] ENDOSCOPY;  Service: Gastroenterology;;   Thressa Sheller STUDY  11/26/2020   Procedure: BUBBLE STUDY;  Surgeon: Jodelle Red, MD;  Location: [facility] ENDOSCOPY;  Service: Cardiovascular;;   CARDIOVERSION N/A 05/06/2020   Procedure: CARDIOVERSION;  Surgeon: Jodelle Red, MD;  Location: [facility] ENDOSCOPY;  Service: Cardiovascular;  Laterality: N/A;   CARDIOVERSION N/A 11/04/2020   Procedure: CARDIOVERSION;  Surgeon: Lewayne Bunting, MD;  Location: [facility] ENDOSCOPY;  Service: Cardiovascular;  Laterality: N/A;   CARDIOVERSION N/A 02/01/2021   Procedure: CARDIOVERSION;  Surgeon: Dolores Patty, MD;  Location: [facility] ENDOSCOPY;  Service: Cardiovascular;  Laterality: N/A;    CARDIOVERSION N/A 02/09/2021   Procedure: CARDIOVERSION;  Surgeon: Dolores Patty, MD;  Location: [facility] ENDOSCOPY;  Service: Cardiovascular;  Laterality: N/A;   CARDIOVERSION N/A 04/19/2021   Procedure: CARDIOVERSION;  Surgeon: Pricilla Riffle, MD;  Location: [facility] ENDOSCOPY;  Service: Cardiovascular;  Laterality: N/A;   CARDIOVERSION N/A 11/25/2021   Procedure: CARDIOVERSION;  Surgeon: Dolores Patty, MD;  Location: [facility] ENDOSCOPY;  Service: Cardiovascular;  Laterality: N/A;   CARDIOVERSION N/A 01/31/2022   Procedure: CARDIOVERSION;  Surgeon: Dolores Patty, MD;  Location: [facility] ENDOSCOPY;  Service: Cardiovascular;  Laterality: N/A;   CATARACT EXTRACTION, BILATERAL     CHOLECYSTECTOMY     COLONOSCOPY WITH PROPOFOL N/A 03/05/2015   Procedure: COLONOSCOPY WITH PROPOFOL;  Surgeon: Jeani Hawking, MD;  Location: [facility] ENDOSCOPY;  Service: Endoscopy;  Laterality: N/A;   DIAGNOSTIC LAPAROSCOPY     ESOPHAGOGASTRODUODENOSCOPY (EGD) WITH PROPOFOL N/A 01/12/2023   Procedure: ESOPHAGOGASTRODUODENOSCOPY (EGD) WITH PROPOFOL;  Surgeon: Jeani Hawking, MD;  Location: [facility] ENDOSCOPY;  Service: Gastroenterology;  Laterality: N/A;   FISTULA SUPERFICIALIZATION Left 03/17/2022   Procedure: FISTULA SUPERFICIALIZATION -LEFT;  Surgeon: Chuck Hint, MD;  Location: [facility] OR;  Service: Vascular;  Laterality: Left;   ingrown hallux Left    IR FLUORO GUIDE CV LINE  RIGHT  03/14/2022   IR US GUIDE VASC ACCESS RIGHT  03/14/2022   KNEE SURGERY     LEFT HEART CATH AND CORONARY ANGIOGRAPHY N/A 12/31/2017   Procedure: LEFT HEART CATH AND CORONARY ANGIOGRAPHY;  Surgeon: Rinaldo Cloud, MD;  Location: [facility] INVASIVE CV LAB;  Service: Cardiovascular;  Laterality: N/A;   MAZE N/A 12/29/2020   Procedure: MAZE;  Surgeon: Loreli Slot, MD;  Location: [facility] OR;  Service: Open Heart Surgery;  Laterality: N/A;   MITRAL VALVE REPAIR N/A 12/29/2020   Procedure: MITRAL VALVE REPAIR USING CARBOMEDICS ANNULOFLEX RING SIZE 30;  Surgeon:  Loreli Slot, MD;  Location: [facility] OR;  Service: Open Heart Surgery;  Laterality: N/A;   REVISON OF ARTERIOVENOUS FISTULA Left 01/05/2023   Procedure: REVISION OF LEFT ARTERIOVENOUS FISTULA WITH INTERPOSITION PTFE GRAFT;  Surgeon: Chuck Hint, MD;  Location: [facility] OR;  Service: Vascular;  Laterality: Left;  ELIQUIS AND HOLD 48 X HOURS   RIGHT HEART CATH N/A 12/22/2020   Procedure: RIGHT HEART CATH;  Surgeon: Laurey Morale, MD;  Location: [facility] INVASIVE CV LAB;  Service: Cardiovascular;  Laterality: N/A;   RIGHT HEART CATH N/A 01/31/2021   Procedure: RIGHT HEART CATH;  Surgeon: Dolores Patty, MD;  Location: [facility] INVASIVE CV LAB;  Service: Cardiovascular;  Laterality: N/A;   RIGHT HEART CATH N/A 07/29/2021   Procedure: RIGHT HEART CATH;  Surgeon: Dolores Patty, MD;  Location: [facility] INVASIVE CV LAB;  Service: Cardiovascular;  Laterality: N/A;   RIGHT HEART CATH N/A 11/24/2021   Procedure: RIGHT HEART CATH;  Surgeon: Dolores Patty, MD;  Location: [facility] INVASIVE CV LAB;  Service: Cardiovascular;  Laterality: N/A;   RIGHT HEART CATH N/A 02/14/2022   Procedure: RIGHT HEART CATH;  Surgeon: Dolores Patty, MD;  Location: [facility] INVASIVE CV LAB;  Service: Cardiovascular;  Laterality: N/A;   RIGHT HEART CATH N/A 02/22/2022   Procedure: RIGHT HEART CATH;  Surgeon: Laurey Morale, MD;  Location: [facility] INVASIVE CV LAB;  Service: Cardiovascular;  Laterality: N/A;   TEE WITHOUT CARDIOVERSION N/A 11/26/2020   Procedure: TRANSESOPHAGEAL ECHOCARDIOGRAM (TEE);  Surgeon: Jodelle Red, MD;  Location: [facility] ENDOSCOPY;  Service: Cardiovascular;  Laterality: N/A;   TEE WITHOUT CARDIOVERSION N/A 12/29/2020   Procedure: TRANSESOPHAGEAL ECHOCARDIOGRAM (TEE);  Surgeon: Loreli Slot, MD;  Location: [facility] OR;  Service: Open Heart Surgery;  Laterality: N/A;   TEE WITHOUT CARDIOVERSION N/A 01/20/2022   Procedure: TRANSESOPHAGEAL ECHOCARDIOGRAM (TEE);  Surgeon: Dolores Patty, MD;  Location: [facility]  ENDOSCOPY;  Service: Cardiovascular;  Laterality: N/A;   TRICUSPID VALVE REPLACEMENT N/A 12/29/2020   Procedure: 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 RING SIZE 34;  Surgeon: Loreli Slot, MD;  Location: [facility] OR;  Service: Open Heart Surgery;  Laterality: N/A;    Inpatient Medications: Scheduled Meds:  amiodarone  200 mg Oral Daily   apixaban  5 mg Oral BID   busPIRone  5 mg Oral BID   colchicine  0.3 mg Oral Daily   doxercalciferol  2 mcg Intravenous Q T,Th,Sa-HD   DULoxetine  60 mg Oral Daily   feeding supplement  1 Container Oral TID BM   insulin aspart  0-20 Units Subcutaneous TID WC   lidocaine-prilocaine  1 Application Topical Q T,Th,Sa-HD   loratadine  10 mg Oral Daily   midodrine  10 mg Oral Q T,Th,Sat-1800   pantoprazole  40 mg Oral Daily   rosuvastatin  10 mg Oral Daily   Continuous Infusions:   PRN  Meds: acetaminophen, albuterol, diphenoxylate-atropine, guaiFENesin-dextromethorphan, meclizine, menthol-cetylpyridinium, metoprolol tartrate, nitroGLYCERIN, prochlorperazine  Allergies:    Allergies  Allergen Reactions   Bee Pollen Anaphylaxis and Other (See Comments)    UNCONFIRMED, but IS allergic to bee VENOM   Bee Venom Anaphylaxis   Sulfa Antibiotics Itching   Chlorhexidine    Iodinated Contrast Media Nausea And Vomiting    Social History:   Social History   Socioeconomic History   Marital status: Widowed    Spouse name: Not on file   Number of children: 2   Years of education: Not on file   Highest education level: Some college, no degree  Occupational History   Not on file  Tobacco Use   Smoking status: Never    Passive exposure: Never   Smokeless tobacco: Never   Tobacco comments:    Never smoke 10/20/22  Vaping Use   Vaping status: Never Used  Substance and Sexual Activity   Alcohol use: No   Drug use: No   Sexual activity: Not Currently  Other Topics Concern   Not on file  Social History Narrative   [place] lives in  [place] with [place].  2 grown children.   Previously worked in [employer] at [employer].  Now on disability   Social Determinants of Health   Financial Resource Strain: Low Risk  (03/31/2022)   Overall Financial Resource Strain (CARDIA)    Difficulty of Paying Living Expenses: Not hard at all  Food Insecurity: No Food Insecurity (03/27/2023)   Hunger Vital Sign    Worried About Running Out of Food in the Last Year: Never true    Ran Out of Food in the Last Year: Never true  Transportation Needs: No Transportation Needs (03/27/2023)   PRAPARE - Administrator, Civil Service (Medical): No    Lack of Transportation (Non-Medical): No  Physical Activity: Sufficiently Active (03/31/2022)   Exercise Vital Sign    Days of Exercise per Week: 4 days    Minutes of Exercise per Session: 40 min  Stress: Stress Concern Present (03/31/2022)   [employer] of Occupational Health - Occupational Stress Questionnaire    Feeling of Stress : Very much  Social Connections: Socially Isolated (03/31/2022)   Social Connection and Isolation Panel [NHANES]    Frequency of Communication with Friends and Family: More than three times a week    Frequency of Social Gatherings with Friends and Family: More than three times a week    Attends Religious Services: Never    Database administrator or Organizations: No    Attends Banker Meetings: Never    Marital Status: Widowed  Intimate Partner Violence: Not At Risk (03/27/2023)   Humiliation, Afraid, Rape, and Kick questionnaire    Fear of Current or Ex-Partner: No    Emotionally Abused: No    Physically Abused: No    Sexually Abused: No    Family History:    Family History  Problem Relation Age of Onset   Breast cancer Mother    Cancer Mother    Hypertension Mother    Cancer Father    Hypertension Sister    Hypertension Brother    CAD Other      ROS:  Please see the history of present illness.   All other ROS reviewed and  negative.     Physical Exam/Data:   Vitals:   03/29/23 0426 03/29/23 0804 03/29/23 1247 03/29/23 1300  BP: 108/62 111/76 119/76   Pulse: (!) 106 Marland Kitchen)  105 (!) 115 99  Resp: 18 20 18    Temp: 97.8 F (36.6 C) 98.2 F (36.8 C) 97.9 F (36.6 C)   TempSrc: Oral Oral Oral   SpO2: 98% 100% 99% 92%  Weight:      Height:        Intake/Output Summary (Last 24 hours) at 03/29/2023 1651 Last data filed at 03/29/2023 1618 Gross per 24 hour  Intake 2983.77 ml  Output --  Net 2983.77 ml      03/29/2023   12:29 AM 03/27/2023    6:41 PM 03/17/2023   11:49 AM  Last 3 Weights  Weight (lbs) 288 lb 9.3 oz 279 lb 8.7 oz 278 lb  Weight (kg) 130.9 kg 126.8 kg 126.1 kg     Body mass index is 45.2 kg/m.  General:  Well nourished, well developed, in no acute distress HEENT: normal Neck: no JVD Vascular: No carotid bruits; Distal pulses 2+ bilaterally Cardiac:  normal S1, S2; tachycardic, IRIR; 3/6 holosystolic murmur Lungs:  clear to auscultation bilaterally, no wheezing, rhonchi or rales  Abd: soft, nontender, no hepatomegaly  Ext: no edema Musculoskeletal:  No deformities, BUE and BLE strength normal and equal Skin: warm and dry  Neuro:  CNs 2-12 intact, no focal abnormalities noted Psych:  Normal affect   EKG:  The EKG was personally reviewed and demonstrates:  Atrial fibrillation RVR, 129 bpm Telemetry:  Telemetry was personally reviewed and demonstrates:  atrial fibrillation with rates largely 90s-100s  Relevant CV Studies:  Echo: 02/2022  1. Abnormal septal motion . Left ventricular ejection fraction, by  estimation, is 50 to 55%. The left ventricle has low normal function. The  left ventricle has no regional wall motion abnormalities.   2. Right ventricular systolic function is normal. The right ventricular  size is normal.   3. Left atrial size was severely dilated.   4. Right atrial size was severely dilated.   5. Post repair with annuloplasty ring no doppler/color flow done. The   mitral valve was not assessed. No evidence of mitral valve regurgitation.  No evidence of mitral stenosis.   6. Post repair with ring no doppler/color flow done. Tricuspid valve  regurgitation is not demonstrated.   7. The aortic valve was not assessed. Aortic valve regurgitation is not  visualized. No aortic stenosis is present.   8. The inferior vena cava is normal in size with greater than 50%  respiratory variability, suggesting right atrial pressure of 3 mmHg.   FINDINGS   Left Ventricle: Abnormal septal motion. Left ventricular ejection  fraction, by estimation, is 50 to 55%. The left ventricle has low normal  function. The left ventricle has no regional wall motion abnormalities.  The left ventricular internal cavity size  was normal in size. There is no left ventricular hypertrophy.   Right Ventricle: The right ventricular size is normal. No increase in  right ventricular wall thickness. Right ventricular systolic function is  normal.   Left Atrium: Left atrial size was severely dilated.   Right Atrium: Right atrial size was severely dilated.   Pericardium: There is no evidence of pericardial effusion.   Mitral Valve: Post repair with annuloplasty ring no doppler/color flow  done. The mitral valve was not assessed. No evidence of mitral valve  stenosis.   Tricuspid Valve: Post repair with ring no doppler/color flow done. The  tricuspid valve is not assessed. Tricuspid valve regurgitation is not  demonstrated. No evidence of tricuspid stenosis.   Aortic Valve: The aortic  valve was not assessed. Aortic valve  regurgitation is not visualized. No aortic stenosis is present.   Pulmonic Valve: The pulmonic valve was not assessed. Pulmonic valve  regurgitation is not visualized. No evidence of pulmonic stenosis.   Aorta: The aortic root is normal in size and structure.   Venous: The inferior vena cava is normal in size with greater than 50%  respiratory variability,  suggesting right atrial pressure of 3 mmHg.   IAS/Shunts: No atrial level shunt detected by color flow Doppler.    Laboratory Data:  High Sensitivity Troponin:   Recent Labs  Lab 03/14/23 1210 03/14/23 1618 03/27/23 0946  TROPONINIHS 7 8 8      Chemistry Recent Labs  Lab 03/27/23 0946  NA 136  K 3.4*  CL 95*  CO2 27  GLUCOSE 113*  BUN 22  CREATININE 6.39*  CALCIUM 9.4  GFRNONAA 7*  ANIONGAP 14    Recent Labs  Lab 03/27/23 0946 03/28/23 1409  PROT 8.2*  --   ALBUMIN 3.8 3.3*  AST 17  --   ALT 12  --   ALKPHOS 75  --   BILITOT 0.9  --    Lipids No results for input(s): "CHOL", "TRIG", "HDL", "LABVLDL", "LDLCALC", "CHOLHDL" in the last 168 hours.  Hematology Recent Labs  Lab 03/27/23 0946  WBC 7.7  RBC 3.41*  HGB 10.4*  HCT 33.5*  MCV 98.2  MCH 30.5  MCHC 31.0  RDW 16.1*  PLT 167   Thyroid No results for input(s): "TSH", "FREET4" in the last 168 hours.  BNPNo results for input(s): "BNP", "PROBNP" in the last 168 hours.  DDimer No results for input(s): "DDIMER" in the last 168 hours.   Radiology/Studies:  CT Head Wo Contrast  Result Date: 03/27/2023 CLINICAL DATA:  Altered mental status EXAM: CT HEAD WITHOUT CONTRAST TECHNIQUE: Contiguous axial images were obtained from the base of the skull through the vertex without intravenous contrast. RADIATION DOSE REDUCTION: This exam was performed according to the departmental dose-optimization program which includes automated exposure control, adjustment of the mA and/or kV according to patient size and/or use of iterative reconstruction technique. COMPARISON:  03/14/2023 FINDINGS: Brain: No acute intracranial findings are seen in noncontrast CT brain. There are no signs of bleeding within the cranium. Ventricles are not dilated. There is no focal edema or mass effect. Vascular: Unremarkable. Skull: Hyperostosis frontalis interna is seen. No acute findings are seen. Sinuses/Orbits: Unremarkable. Other: None.  IMPRESSION: No acute intracranial findings are seen in noncontrast CT brain. Electronically Signed   By: Ernie Avena M.D.   On: 03/27/2023 14:23   DG Chest Portable 1 View  Result Date: 03/27/2023 CLINICAL DATA:  Chest pain EXAM: PORTABLE CHEST 1 VIEW COMPARISON:  X-ray 03/14/2023 FINDINGS: Underinflated x-ray. Sternal wires. Atrial occlusion clip. Prosthetic valve. Right IJ double-lumen catheter with tip along the right atrium. Enlarged heart with vascular congestion. Question trace edema. No pneumothorax or effusion. Eventration of the right hemidiaphragm. No pneumothorax. Overlapping cardiac leads. IMPRESSION: Postop chest. Enlarged heart with vascular congestion and trace edema. Right IJ catheter. Electronically Signed   By: Karen Kays M.D.   On: 03/27/2023 10:30     Assessment and Plan:   Julieth Pung is a 67 y.o. female with a hx of morbid obesity, chronic combined heart failure, severe MR/TR, longstanding persistent atrial fibrillation on Eliquis, hypertension, diabetes, ESRD on HD who is being seen 03/29/2023 for the evaluation of atrial fibrillation at the request of Dr. Alvino Chapel.  Long  standing persistent atrial fibrillation OSA -- has failed tikosyn, hx of MAZE and multiple DCCVs in the past. Suspect most recent episodes of RVR are related to acute illness with COVID. Would focus on rate control, as she would not a be a candidate for DCCV/maintain sinus rhythm at this time with acute infection -- continue amiodarone 200mg  daily, Eliquis 5mg  BID  Gastroenteritis  COVID -- GI panel negative -- management per primary  HFimpEF -- prior LVEF of 35-40%, last echo with improvement to 50-55% -- volume management per HD -- GDMT: limited in the setting of renal disease and lower bps  S/p MVR/TVR 12/2020, post op mitral stenosis, severe TR -- follows in the AHF clinic -- has been seen by the structural team and turned down for TV valve in ring 2/2 markedly elevated wedge  pressures  ESRD on HD -- per nephrology  Risk Assessment/Risk Scores:   New York Heart Association (NYHA) Functional Class NYHA Class II  CHA2DS2-VASc Score = 6   This indicates a 9.7% annual risk of stroke. The patient's score is based upon: CHF History: 1 HTN History: 1 Diabetes History: 1 Stroke History: 0 Vascular Disease History: 1 Age Score: 1 Gender Score: 1   For questions or updates, please contact [facility] Please consult [email or web address] for contact info under   Note prepared with the assistance of Laverda Page, NP.  Signed, Jodelle Red, MD  03/29/2023 4:51 PM

## 2023-03-29 NOTE — Plan of Care (Signed)
  Problem: Education: Goal: Knowledge of General Education information will improve Description: Including pain rating scale, medication(s)/side effects and non-pharmacologic comfort measures Outcome: Progressing   Problem: Health Behavior/Discharge Planning: Goal: Ability to manage health-related needs will improve Outcome: Progressing   Problem: Clinical Measurements: Goal: Respiratory complications will improve Outcome: Progressing   Problem: Clinical Measurements: Goal: Cardiovascular complication will be avoided Outcome: Progressing   Problem: Elimination: Goal: Will not experience complications related to bowel motility Outcome: Progressing   Problem: Pain Managment: Goal: General experience of comfort will improve Outcome: Progressing   Problem: Safety: Goal: Ability to remain free from injury will improve Outcome: Progressing   Problem: Skin Integrity: Goal: Risk for impaired skin integrity will decrease Outcome: Progressing

## 2023-03-30 DIAGNOSIS — I4819 Other persistent atrial fibrillation: Secondary | ICD-10-CM | POA: Diagnosis not present

## 2023-03-30 DIAGNOSIS — K529 Noninfective gastroenteritis and colitis, unspecified: Secondary | ICD-10-CM | POA: Diagnosis not present

## 2023-03-30 DIAGNOSIS — U071 COVID-19: Secondary | ICD-10-CM | POA: Diagnosis not present

## 2023-03-30 DIAGNOSIS — I428 Other cardiomyopathies: Secondary | ICD-10-CM | POA: Diagnosis not present

## 2023-03-30 LAB — GLUCOSE, CAPILLARY
Glucose-Capillary: 88 mg/dL (ref 70–99)
Glucose-Capillary: 99 mg/dL (ref 70–99)

## 2023-03-30 MED ORDER — LOPERAMIDE HCL 2 MG PO CAPS
2.0000 mg | ORAL_CAPSULE | ORAL | Status: DC | PRN
  Administered 2023-03-30: 2 mg via ORAL
  Filled 2023-03-30 (×2): qty 1

## 2023-03-30 MED ORDER — METOPROLOL TARTRATE 12.5 MG HALF TABLET
12.5000 mg | ORAL_TABLET | Freq: Two times a day (BID) | ORAL | Status: DC
  Administered 2023-03-30: 12.5 mg via ORAL
  Filled 2023-03-30: qty 1

## 2023-03-30 MED ORDER — MIDODRINE HCL 10 MG PO TABS: 10.0000 mg | ORAL_TABLET | ORAL | Status: DC

## 2023-03-30 MED ORDER — METOPROLOL TARTRATE 25 MG PO TABS: 12.5000 mg | ORAL_TABLET | Freq: Two times a day (BID) | ORAL | 0 refills | Status: DC

## 2023-03-30 MED ORDER — MELATONIN 3 MG PO TABS
3.0000 mg | ORAL_TABLET | Freq: Every evening | ORAL | Status: DC | PRN
  Administered 2023-03-30: 3 mg via ORAL
  Filled 2023-03-30: qty 1

## 2023-03-30 NOTE — Plan of Care (Signed)
[facility] Patient Discharge Orders- [facility] CLINIC: [facility]  Patient's name: Aliahna Weingarten Admit/DC Dates: 03/27/2023 - 03/30/2023  Discharge Diagnoses: (+) COVID-19, supportive care   Gastroenteritis- 2nd COVID-19, GI PCR (-), supportive care Chronic Afib with RVR-on Eliquis, Amio, and Lopressor  Aranesp: Given: No    Last Hgb: 9.9 PRBC's Given: No  ESA dose for discharge: Resume mircera 75 mcg IV q 2 weeks  IV Iron dose at discharge: Continue weekly Venofer  Heparin change: No  EDW Change: No   Bath Change: No  Access intervention/Change: No  Hectorol  change: No  Discharge Labs: Calcium 8.9 Phosphorus 5.1 Albumin 3.4 K+ 4.3  IV Antibiotics: No   On Coumadin?: No     D/C Meds to be reconciled by nurse after every discharge.  Completed By: Salome Holmes, NP   Reviewed by: MD:______ RN_______

## 2023-03-30 NOTE — Progress Notes (Signed)
[facility] Progress Note  Subjective: pt seen in room, no new c/o's.  Feeling better, about 90 out of 100 if 100 were the best.   Vitals:   03/30/23 0757 03/30/23 0800 03/30/23 1057 03/30/23 1220  BP: 112/74   95/66  Pulse: (!) 126 (!) 115 (!) 126 93  Resp: 18   20  Temp: (!) 97.5 F (36.4 C)   97.9 F (36.6 C)  TempSrc: Oral   Oral  SpO2: 96% 94%  97%  Weight:      Height:        Exam: Gen alert, no distress, looks better Sclera anicteric, throat clear  No jvd or bruits Chest clear bilat to bases RRR no RG Abd soft obese ntnd no mass or ascites +bs Ext no LE edema Neuro is alert, Ox 3 , nf    RIJ TDC/  L AVF+bruit          Home meds include - albuterol, amiodarone, apixaban, buspirone, colchicine 0.3 every day, dulaglutide, duloxetine, epi pen, insulin lispro, emla cream, loratadine, meclizine, midodrine 10mg  pre hd tts, mvi, sl ntg, ondansetron, pantoprazole, rosuvastatin, torsemide 100mg  non hd days         OP HD: SW TTS  4h  400/600   125.2kg  3K/2.5Ca bath TDC/ AVF   Heparin 2500 - last OP HD 8/06, post wt 128.5kg   - last 4 sessions coming off 2-3kg over - hectorol 2 mcg IV three times per week - venofer 50mg  IV weekly - mircera 75 mcg IV q 2 wks, last 8/06, due 8/20     Assessment/ Plan: Vomiting/ diarrhea/ abd pain - suspected viral GE, possibly related to COVID 19. Per pmd. Looks better. May be going home.  ESRD - on HD TTS. HD last night. HD tomorrow if still here.  HTN/ volume - BP's low-normal, not on BP lowering meds and takes midodrine pre HD 10mg  tiw. Was up 3-4kg yest, got 3 L off at HD last night. At dry wt today.  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 Cont IV vdra. Get binder info DM2 - on insulin  Covid 19+ - per pmd Chronic afib  COPD Dispo - ok for dc from renal standpoint      Vinson Moselle MD  [facility] 03/30/2023, 4:50 PM  Recent Labs  Lab 03/28/23 1409 03/29/23 2020 03/30/23 0653  HGB   --  9.7* 9.9*  ALBUMIN 3.3* 3.4*  --   CALCIUM  --  9.0 8.9  PHOS 5.1* 5.1*  --   CREATININE  --  10.54* 7.03*  K  --  3.9 4.3   No results for input(s): "IRON", "TIBC", "FERRITIN" in the last 168 hours. Inpatient medications:  amiodarone  200 mg Oral Daily   apixaban  5 mg Oral BID   busPIRone  5 mg Oral BID   colchicine  0.3 mg Oral Daily   doxercalciferol  2 mcg Intravenous Q T,Th,Sa-HD   DULoxetine  60 mg Oral Daily   feeding supplement  1 Container Oral TID BM   insulin aspart  0-20 Units Subcutaneous TID WC   lidocaine-prilocaine  1 Application Topical Q T,Th,Sa-HD   loratadine  10 mg Oral Daily   metoprolol tartrate  12.5 mg Oral BID   midodrine  10 mg Oral Q T,Th,Sat-1800   pantoprazole  40 mg Oral Daily   rosuvastatin  10 mg Oral Daily    acetaminophen, albuterol,  guaiFENesin-dextromethorphan, loperamide, meclizine, melatonin, menthol-cetylpyridinium, metoprolol tartrate, nitroGLYCERIN, prochlorperazine

## 2023-03-30 NOTE — Progress Notes (Signed)
Rounding Note    Patient Name: [name] Date of Encounter: 03/30/2023  [facility] Cardiologist: Jodelle Red, MD   Subjective   No acute events overnight. Cough stable. Tolerated HD last night. Had diarrhea overnight but none since then.   Inpatient Medications    Scheduled Meds:  amiodarone  200 mg Oral Daily   apixaban  5 mg Oral BID   busPIRone  5 mg Oral BID   colchicine  0.3 mg Oral Daily   doxercalciferol  2 mcg Intravenous Q T,Th,Sa-HD   DULoxetine  60 mg Oral Daily   feeding supplement  1 Container Oral TID BM   insulin aspart  0-20 Units Subcutaneous TID WC   lidocaine-prilocaine  1 Application Topical Q T,Th,Sa-HD   loratadine  10 mg Oral Daily   metoprolol tartrate  12.5 mg Oral BID   midodrine  10 mg Oral Q T,Th,Sat-1800   pantoprazole  40 mg Oral Daily   rosuvastatin  10 mg Oral Daily   Continuous Infusions:  PRN Meds: acetaminophen, albuterol, guaiFENesin-dextromethorphan, loperamide, meclizine, melatonin, menthol-cetylpyridinium, metoprolol tartrate, nitroGLYCERIN, prochlorperazine   Vital Signs    Vitals:   03/30/23 0535 03/30/23 0757 03/30/23 0800 03/30/23 1057  BP: 119/84 112/74    Pulse: (!) 113 (!) 126 (!) 115 (!) 126  Resp: 20 18    Temp: 98.3 F (36.8 C) (!) 97.5 F (36.4 C)    TempSrc: Oral Oral    SpO2: 96% 96% 94%   Weight:      Height:        Intake/Output Summary (Last 24 hours) at 03/30/2023 1119 Last data filed at 03/29/2023 2345 Gross per 24 hour  Intake 1367.6 ml  Output 20560.39 ml  Net -19192.79 ml      03/30/2023   12:26 AM 03/29/2023   12:29 AM 03/27/2023    6:41 PM  Last 3 Weights  Weight (lbs) 277 lb 3.2 oz 288 lb 9.3 oz 279 lb 8.7 oz  Weight (kg) 125.737 kg 130.9 kg 126.8 kg      Telemetry    Atrial fibrillation, rates 90s-110s - Personally Reviewed  ECG    No new - Personally Reviewed  Physical Exam   GEN: No acute distress.   Neck: No JVD appreciated Cardiac: IRIR, 2/6  holosystolic murmur Respiratory: Clear to auscultation bilaterally. GI: Soft, nontender, non-distended  MS: No edema; No deformity. Neuro:  Nonfocal  Psych: Normal affect   Labs    High Sensitivity Troponin:   Recent Labs  Lab 03/14/23 1210 03/14/23 1618 03/27/23 0946  TROPONINIHS 7 8 8      Chemistry Recent Labs  Lab 03/27/23 0946 03/28/23 1409 03/29/23 2020 03/30/23 0653  NA 136  --  132* 131*  K 3.4*  --  3.9 4.3  CL 95*  --  93* 91*  CO2 27  --  25 26  GLUCOSE 113*  --  102* 97  BUN 22  --  51* 29*  CREATININE 6.39*  --  10.54* 7.03*  CALCIUM 9.4  --  9.0 8.9  PROT 8.2*  --   --   --   ALBUMIN 3.8 3.3* 3.4*  --   AST 17  --   --   --   ALT 12  --   --   --   ALKPHOS 75  --   --   --   BILITOT 0.9  --   --   --   GFRNONAA 7*  --  4* 6*  ANIONGAP 14  --  14 14    Lipids No results for input(s): "CHOL", "TRIG", "HDL", "LABVLDL", "LDLCALC", "CHOLHDL" in the last 168 hours.  Hematology Recent Labs  Lab 03/27/23 0946 03/29/23 2020 03/30/23 0653  WBC 7.7 7.8 8.6  RBC 3.41* 3.08* 3.16*  HGB 10.4* 9.7* 9.9*  HCT 33.5* 30.6* 31.4*  MCV 98.2 99.4 99.4  MCH 30.5 31.5 31.3  MCHC 31.0 31.7 31.5  RDW 16.1* 16.2* 16.2*  PLT 167 158 144*   Thyroid No results for input(s): "TSH", "FREET4" in the last 168 hours.  BNPNo results for input(s): "BNP", "PROBNP" in the last 168 hours.  DDimer No results for input(s): "DDIMER" in the last 168 hours.   Radiology    No results found.  Cardiac Studies   No new this admission; prior studies reviewed  Patient Profile     67 y.o. female with a hx of morbid obesity, chronic combined heart failure, severe MR/TR, longstanding persistent atrial fibrillation on Eliquis, hypertension, diabetes, ESRD on HD who is being seen for the evaluation of atrial fibrillation at the request of Dr. Alvino Chapel   Assessment & Plan    Long standing persistent atrial fibrillation OSA -has failed tikosyn, hx of MAZE and multiple DCCVs in the past.  Suspect most recent episodes of RVR are related to acute illness with COVID. Would focus on rate control, as she would not a be a candidate for DCCV/maintain sinus rhythm at this time with acute infection -continue amiodarone 200mg  daily, Eliquis 5mg  BID -she requires midodrine chronically for hypotension with dialysis. Not an ideal candidate for standing metoprolol or diltiazem, but given higher risk of hypotension with diltiazem compared to metoprolol, will give low dose oral metoprolol in the short term as this is likely to control her better than intermittent IV metoprolol -we reviewed what to watch for with metoprolol, and she will contact me with any issues or concerns   Gastroenteritis  COVID -GI panel negative -management per primary   HFimpEF -prior LVEF of 35-40%, last echo with improvement to 50-55% -volume management per HD -GDMT: limited in the setting of renal disease and lower bps   S/p MVR/TVR 12/2020, post op mitral stenosis, severe TR -follows in the AHF clinic -has been seen by the structural team and turned down for TV valve in ring 2/2 markedly elevated wedge pressures   ESRD on HD -- per nephrology  Overall she appears at her baseline; her cardiac conditions are a chronic challenge, and her heart rates will likely continue to be elevated as she recovers from covid. Ok for discharge from cardiology standpoint when she is ready from a medicine standpoint.  [facility] will sign off.   Medication Recommendations:  adding low dose metoprolol to prior outpatient meds, given hold parameters Other recommendations (labs, testing, etc):  none Follow up as an outpatient:  We arranged follow up with me on 04/10/23  For questions or updates, please contact [facility] Please consult [email or web address] for contact info under        Signed, Jodelle Red, MD  03/30/2023, 11:19 AM

## 2023-03-30 NOTE — Care Management Important Message (Signed)
Important Message  Patient Details  Name: [name] MRN: [id number] Date of Birth: April 01, 1956   Medicare Important Message Given:  Yes     Renie Ora 03/30/2023, 8:44 AM

## 2023-03-30 NOTE — Progress Notes (Signed)
[facility] night cross cover note:   I was notified by RN of the patient's request for a sleep aid. I subsequently placed order for prn melatonin for insomnia.     Justin Howerter, DO Hospitalist  

## 2023-03-30 NOTE — Progress Notes (Signed)
[facility] night cross cover note:   Regarding this patient who is hospitalized with COVID-19 infection complicated by atrial fibrillation with RVR, with a negative GI panel, RN noted that existing order for Lomotil includes a component of atropine.  In the setting of the patient's atrial fibrillation with RVR, will discontinue prn Lomotil in favor of as needed Imodium.     Newton Pigg, DO Hospitalist

## 2023-03-30 NOTE — Discharge Summary (Signed)
Physician Discharge Summary  [name] WUJ:[id number] DOB: Nov 11, 1955 DOA: 03/27/2023  PCP: Grayce Sessions, NP  Admit date: 03/27/2023 Discharge date: 03/30/2023  Admitted From: Home Disposition: Home  Recommendations for Outpatient Follow-up:  Follow up with PCP in 1 week Follow up with Dr. Cristal Deer, cardiology 8/20  Discharge Condition: Stable CODE STATUS: Full code Diet recommendation: Renal diet  Brief/Interim Summary: [name] is a 67 y.o. female with history of end-stage renal disease on dialysis TTSa, HFrEF, chronic atrial fibrillation, DM2, HTN, gastroparesis, GERD, OSA on CPAP.  Patient reports she felt okay yesterday but woke up this morning feeling extremely nauseated and weak.  She went to dialysis and completed nearly half of her session when she began to feel extremely nauseated and vomited. She has also being having loose watery stools. EMS was activated.  EMT reports the  patient was in A-fib with RVR, heart rate improved to about 100 bpm average en route to the hospital.  She was given 1 dose of Zofran with no improvement of her symptoms.  The patient reports she is to continue to feel extremely nauseated, continues to have vomiting and loose stools.  She reports some chest discomfort.  She denies fever, headache, sore throat.  Denies sick contacts in the house.  Patient tested positive for COVID in the emergency department.  Patient was admitted for further evaluation and treatment for gastroenteritis.  Patient had another episode of A-fib RVR, which resolved.  Cardiology was consulted.  Patient's gastroenteritis and COVID symptoms continue to improve with supportive care.  Discharge Diagnoses:   Principal Problem:   COVID-19 Active Problems:   Viral gastroenteritis   Chronic atrial fibrillation with RVR (HCC)   ESRD on dialysis (HCC)   Type 2 diabetes mellitus without complications (HCC)   Gastroparesis   GERD (gastroesophageal reflux  disease)   OSA on CPAP   Essential hypertension   COPD (chronic obstructive pulmonary disease) (HCC)   HFrEF (heart failure with reduced ejection fraction) (HCC)   Gastroenteritis   Gastroenteritis -Could be secondary to COVID, versus other -GI PCR negative -Symptom management with supportive care -Improving   COVID-19 -Tested positive 8/6 -Supportive care   Chronic A-fib, with RVR  -Eliquis, amiodarone, Lopressor -Cardiology following   ESRD -HD TTSa -Nephrology following   Diabetes mellitus -A1c 6 -Sliding scale insulin   COPD -Without exacerbation   Chronic diastolic HF -Without exacerbation, last echo showed EF 50-55%    OSA on CPAP -CPAP nightly   GERD -PPI    Discharge Instructions  Discharge Instructions     Call MD for:  difficulty breathing, headache or visual disturbances   Complete by: As directed    Call MD for:  extreme fatigue   Complete by: As directed    Call MD for:  persistant dizziness or light-headedness   Complete by: As directed    Call MD for:  persistant nausea and vomiting   Complete by: As directed    Call MD for:  severe uncontrolled pain   Complete by: As directed    Call MD for:  temperature >100.4   Complete by: As directed    Discharge instructions   Complete by: As directed    You were cared for by a hospitalist during your hospital stay. If you have any questions about your discharge medications or the care you received while you were in the hospital after you are discharged, you can call the unit and ask to speak with the  hospitalist on call if the hospitalist that took care of you is not available. Once you are discharged, your primary care physician will handle any further medical issues. Please note that NO REFILLS for any discharge medications will be authorized once you are discharged, as it is imperative that you return to your primary care physician (or establish a relationship with a primary care physician if you  do not have one) for your aftercare needs so that they can reassess your need for medications and monitor your lab values.   Increase activity slowly   Complete by: As directed    No wound care   Complete by: As directed       Allergies as of 03/30/2023       Reactions   Bee Pollen Anaphylaxis, Other (See Comments)   UNCONFIRMED, but IS allergic to bee VENOM   Bee Venom Anaphylaxis   Sulfa Antibiotics Itching   Chlorhexidine    Iodinated Contrast Media Nausea And Vomiting        Medication List     STOP taking these medications    torsemide 100 MG tablet Commonly known as: DEMADEX       TAKE these medications    Accu-Chek Guide test strip Generic drug: glucose blood USE TO CHECK BLOOD SUGAR 3 TIMES DAILY   Accu-Chek Guide w/Device Kit Use to check blood sugar TID. Dx: E11.69 What changed: additional instructions   acetaminophen 325 MG tablet Commonly known as: TYLENOL Take 2 tablets (650 mg total) by mouth every 6 (six) hours as needed for mild pain or moderate pain.   albuterol 108 (90 Base) MCG/ACT inhaler Commonly known as: Proventil HFA Inhale 1-2 puffs into the lungs every 6 (six) hours as needed for wheezing or shortness of breath.   amiodarone 200 MG tablet Commonly known as: PACERONE Take 1 tablet (200 mg total) by mouth daily.   apixaban 5 MG Tabs tablet Commonly known as: Eliquis Take 1 tablet (5 mg total) by mouth 2 (two) times daily.   busPIRone 5 MG tablet Commonly known as: BUSPAR TAKE ONE TABLET BY MOUTH TWICE DAILY @ 9AM & 5PM   colchicine 0.6 MG tablet Take 1/2 (one-half) tablet by mouth once daily   DULoxetine 60 MG capsule Commonly known as: CYMBALTA TAKE ONE CAPSULE (60MG  TOTAL) BY MOUTH DAILY AT 9AM   EPINEPHrine 0.3 mg/0.3 mL Soaj injection Commonly known as: EPI-PEN Inject 0.3 mg into the muscle as needed for anaphylaxis.   insulin lispro 100 UNIT/ML KwikPen Commonly known as: HumaLOG KwikPen Inject 10 Units into the skin  3 (three) times daily. What changed:  how much to take additional instructions   lidocaine-prilocaine cream Commonly known as: EMLA Apply 1 Application topically Every Tuesday,Thursday,and Saturday with dialysis.   loratadine 10 MG tablet Commonly known as: CLARITIN TAKE ONE TABLET BY MOUTH DAILY AT 9AM   meclizine 25 MG tablet Commonly known as: ANTIVERT Take 1 tablet (25 mg total) by mouth 3 (three) times daily as needed for dizziness.   metoprolol tartrate 25 MG tablet Commonly known as: LOPRESSOR Take 0.5 tablets (12.5 mg total) by mouth 2 (two) times daily.   midodrine 10 MG tablet Commonly known as: PROAMATINE Take 1 tablet (10 mg total) by mouth See admin instructions. Take 10mg  (1 tablet) by mouth on Tuesday's, Thursday's, and Saturday's.   MULTIVITAMIN WOMEN PO Take 1 tablet by mouth daily.   nitroGLYCERIN 0.4 MG SL tablet Commonly known as: NITROSTAT Place 1 tablet (0.4 mg total) under  the tongue every 5 (five) minutes x 3 doses as needed for chest pain.   ondansetron 4 MG tablet Commonly known as: ZOFRAN Take 1 tablet (4 mg total) by mouth every 6 (six) hours. What changed:  when to take this reasons to take this   pantoprazole 40 MG tablet Commonly known as: PROTONIX TAKE ONE TABLET BY MOUTH DAILY AT 9AM   rosuvastatin 10 MG tablet Commonly known as: CRESTOR TAKE ONE TABLET BY MOUTH DAILY AT 5PM   Trulicity 1.5 MG/0.5ML Sopn Generic drug: Dulaglutide Inject 1.5 mg into the skin once a week.        Follow-up Information     Jodelle Red, MD Follow up on 04/10/2023.   Specialty: Cardiology Why: Come to appt with Dr. Cristal Deer on 04/10/23 at 11:40 AM for follow up. Contact information: [address] [place] [facility] [address] [phone]         Grayce Sessions, NP Follow up.   Specialty: Internal Medicine Contact information: [address] [place] [address] [phone]                Allergies   Allergen Reactions   Bee Pollen Anaphylaxis and Other (See Comments)    UNCONFIRMED, but IS allergic to bee VENOM   Bee Venom Anaphylaxis   Sulfa Antibiotics Itching   Chlorhexidine    Iodinated Contrast Media Nausea And Vomiting    Consultations: Nephrology Cardiology   Procedures/Studies: CT Head Wo Contrast  Result Date: 03/27/2023 CLINICAL DATA:  Altered mental status EXAM: CT HEAD WITHOUT CONTRAST TECHNIQUE: Contiguous axial images were obtained from the base of the skull through the vertex without intravenous contrast. RADIATION DOSE REDUCTION: This exam was performed according to the departmental dose-optimization program which includes automated exposure control, adjustment of the mA and/or kV according to patient size and/or use of iterative reconstruction technique. COMPARISON:  03/14/2023 FINDINGS: Brain: No acute intracranial findings are seen in noncontrast CT brain. There are no signs of bleeding within the cranium. Ventricles are not dilated. There is no focal edema or mass effect. Vascular: Unremarkable. Skull: Hyperostosis frontalis interna is seen. No acute findings are seen. Sinuses/Orbits: Unremarkable. Other: None. IMPRESSION: No acute intracranial findings are seen in noncontrast CT brain. Electronically Signed   By: Ernie Avena M.D.   On: 03/27/2023 14:23   DG Chest Portable 1 View  Result Date: 03/27/2023 CLINICAL DATA:  Chest pain EXAM: PORTABLE CHEST 1 VIEW COMPARISON:  X-ray 03/14/2023 FINDINGS: Underinflated x-ray. Sternal wires. Atrial occlusion clip. Prosthetic valve. Right IJ double-lumen catheter with tip along the right atrium. Enlarged heart with vascular congestion. Question trace edema. No pneumothorax or effusion. Eventration of the right hemidiaphragm. No pneumothorax. Overlapping cardiac leads. IMPRESSION: Postop chest. Enlarged heart with vascular congestion and trace edema. Right IJ catheter. Electronically Signed   By: Karen Kays M.D.   On:  03/27/2023 10:30   MR BRAIN WO CONTRAST  Result Date: 03/15/2023 CLINICAL DATA:  Initial evaluation for neuro deficit, stroke suspected. EXAM: MRI HEAD WITHOUT CONTRAST TECHNIQUE: Multiplanar, multiecho pulse sequences of the brain and surrounding structures were obtained without intravenous contrast. COMPARISON:  Prior CT from 03/14/2023. FINDINGS: Brain: Examination mildly degraded by motion artifact. Cerebral volume within normal limits. Scattered patchy T2/FLAIR hyperintensity involving the periventricular deep white matter both cerebral hemispheres, most like related chronic microvascular ischemic disease, mild for age. No evidence for acute or subacute ischemia. Gray-white matter differentiation maintained. No areas of chronic cortical infarction. No acute intracranial hemorrhage. Few punctate chronic micro  hemorrhages noted within the right cerebral hemisphere with a likely small vessel related. No mass lesion, midline shift or mass effect. No hydrocephalus or extra-axial fluid collection. Pituitary gland suprasellar region within normal limits. Vascular: Major intracranial vascular flow voids are maintained. Skull and upper cervical spine: Cranial junction with normal limits. Decreased T1 signal intensity seen within the bone marrow the visualized upper spine, nonspecific, but most commonly related to anemia, smoking or obesity. No scalp soft tissue abnormality. Sinuses/Orbits: Prior bilateral ocular lens replacement. Mild bilateral exophthalmos. Paranasal sinuses are clear. Trace bilateral mastoid effusions, of doubtful significance. Visualized nasopharynx unremarkable. Other: None. IMPRESSION: 1. No acute intracranial abnormality. 2. Mild chronic microvascular ischemic disease for age. Electronically Signed   By: Rise Mu M.D.   On: 03/15/2023 02:00   CT Head Wo Contrast  Result Date: 03/14/2023 CLINICAL DATA:  Headache, neuro deficit EXAM: CT HEAD WITHOUT CONTRAST TECHNIQUE: Contiguous  axial images were obtained from the base of the skull through the vertex without intravenous contrast. RADIATION DOSE REDUCTION: This exam was performed according to the departmental dose-optimization program which includes automated exposure control, adjustment of the mA and/or kV according to patient size and/or use of iterative reconstruction technique. COMPARISON:  MRI and CT scan head from 06/28/2022. FINDINGS: Brain: No evidence of acute infarction, hemorrhage, hydrocephalus, extra-axial collection or mass lesion/mass effect. There is bilateral periventricular hypodensity, which is non-specific but most likely seen in the settings of microvascular ischemic changes. Mild in extent. Vascular: No hyperdense vessel or unexpected calcification. Skull: Normal. Negative for fracture or focal lesion. Note is made of hyperostosis frontalis interna. Sinuses/Orbits: No acute finding. Other: None. IMPRESSION: 1. No acute intracranial abnormality. 2. Mild microvascular ischemic changes. Electronically Signed   By: Jules Schick M.D.   On: 03/14/2023 12:20   DG Chest Portable 1 View  Result Date: 03/14/2023 CLINICAL DATA:  shob EXAM: PORTABLE CHEST 1 VIEW COMPARISON:  02/24/2023. FINDINGS: Probable left basilar atelectatic changes. Bilateral lateral costophrenic angles are clear. Central pulmonary vascular congestion, which may be accentuated by low lung volume. Stable enlarged cardio-mediastinal silhouette. Sternotomy wires noted. There is probable left atrial appendage closure device. Right IJ hemodialysis catheter noted with its tip overlying the cavoatrial junction region. No acute osseous abnormalities. The soft tissues are within normal limits. IMPRESSION: 1. Cardiomegaly and central pulmonary vascular congestion. 2. Left basilar atelectatic changes. Electronically Signed   By: Jules Schick M.D.   On: 03/14/2023 12:17       Discharge Exam: Vitals:   03/30/23 0800 03/30/23 1057  BP:    Pulse: (!) 115 (!)  126  Resp:    Temp:    SpO2: 94%     General: Pt is alert, awake, not in acute distress Cardiovascular: Irregular rhythm, rate 110, S1/S2 +, no edema Respiratory: CTA bilaterally, no wheezing, no rhonchi, no respiratory distress, no conversational dyspnea  Abdominal: Soft, NT, ND, bowel sounds + Extremities: no edema, no cyanosis Psych: Normal mood and affect, stable judgement and insight     The results of significant diagnostics from this hospitalization (including imaging, microbiology, ancillary and laboratory) are listed below for reference.     Microbiology: Recent Results (from the past 240 hour(s))  SARS Coronavirus 2 by RT PCR (hospital order, performed in [facility] hospital lab) *cepheid single result test* Anterior Nasal Swab     Status: Abnormal   Collection Time: 03/27/23  9:46 AM   Specimen: Anterior Nasal Swab  Result Value Ref Range Status   SARS Coronavirus  2 by RT PCR POSITIVE (A) NEGATIVE Final    Comment: Performed at [facility], [address], [place], [place] [address]  Gastrointestinal Panel by PCR , Stool     Status: None   Collection Time: 03/28/23 12:56 PM   Specimen: Stool  Result Value Ref Range Status   Campylobacter species NOT DETECTED NOT DETECTED Final   Plesimonas shigelloides NOT DETECTED NOT DETECTED Final   Salmonella species NOT DETECTED NOT DETECTED Final   Yersinia enterocolitica NOT DETECTED NOT DETECTED Final   Vibrio species NOT DETECTED NOT DETECTED Final   Vibrio cholerae NOT DETECTED NOT DETECTED Final   Enteroaggregative E coli (EAEC) NOT DETECTED NOT DETECTED Final   Enteropathogenic E coli (EPEC) NOT DETECTED NOT DETECTED Final   Enterotoxigenic E coli (ETEC) NOT DETECTED NOT DETECTED Final   Shiga like toxin producing E coli (STEC) NOT DETECTED NOT DETECTED Final   Shigella/Enteroinvasive E coli (EIEC) NOT DETECTED NOT DETECTED Final   Cryptosporidium NOT DETECTED NOT DETECTED Final   Cyclospora cayetanensis NOT  DETECTED NOT DETECTED Final   Entamoeba histolytica NOT DETECTED NOT DETECTED Final   Giardia lamblia NOT DETECTED NOT DETECTED Final   Adenovirus F40/41 NOT DETECTED NOT DETECTED Final   Astrovirus NOT DETECTED NOT DETECTED Final   Norovirus GI/GII NOT DETECTED NOT DETECTED Final   Rotavirus A NOT DETECTED NOT DETECTED Final   Sapovirus (I, II, IV, and V) NOT DETECTED NOT DETECTED Final    Comment: Performed at [facility], [address] [place], [place] [address]     Labs: BNP (last 3 results) Recent Labs    10/23/22 1215 01/11/23 1230 03/14/23 1210  BNP 362.2* 144.1* 152.5*   Basic Metabolic Panel: Recent Labs  Lab 03/27/23 0946 03/28/23 1409 03/29/23 2020 03/30/23 0653  NA 136  --  132* 131*  K 3.4*  --  3.9 4.3  CL 95*  --  93* 91*  CO2 27  --  25 26  GLUCOSE 113*  --  102* 97  BUN 22  --  51* 29*  CREATININE 6.39*  --  10.54* 7.03*  CALCIUM 9.4  --  9.0 8.9  PHOS  --  5.1* 5.1*  --    Liver Function Tests: Recent Labs  Lab 03/27/23 0946 03/28/23 1409 03/29/23 2020  AST 17  --   --   ALT 12  --   --   ALKPHOS 75  --   --   BILITOT 0.9  --   --   PROT 8.2*  --   --   ALBUMIN 3.8 3.3* 3.4*   Recent Labs  Lab 03/27/23 0946  LIPASE 29   No results for input(s): "AMMONIA" in the last 168 hours. CBC: Recent Labs  Lab 03/27/23 0946 03/29/23 2020 03/30/23 0653  WBC 7.7 7.8 8.6  NEUTROABS 6.5  --   --   HGB 10.4* 9.7* 9.9*  HCT 33.5* 30.6* 31.4*  MCV 98.2 99.4 99.4  PLT 167 158 144*   Cardiac Enzymes: No results for input(s): "CKTOTAL", "CKMB", "CKMBINDEX", "TROPONINI" in the last 168 hours. BNP: Invalid input(s): "POCBNP" CBG: Recent Labs  Lab 03/29/23 0809 03/29/23 1254 03/29/23 1657 03/29/23 2053 03/30/23 0753  GLUCAP 81 94 79 87 88   D-Dimer No results for input(s): "DDIMER" in the last 72 hours. Hgb A1c No results for input(s): "HGBA1C" in the last 72 hours. Lipid Profile No results for input(s): "CHOL", "HDL",  "LDLCALC", "TRIG", "CHOLHDL", "LDLDIRECT" in the last  72 hours. Thyroid function studies No results for input(s): "TSH", "T4TOTAL", "T3FREE", "THYROIDAB" in the last 72 hours.  Invalid input(s): "FREET3" Anemia work up No results for input(s): "VITAMINB12", "FOLATE", "FERRITIN", "TIBC", "IRON", "RETICCTPCT" in the last 72 hours. Urinalysis    Component Value Date/Time   COLORURINE AMBER (A) 11/07/2022 1447   APPEARANCEUR CLOUDY (A) 11/07/2022 1447   LABSPEC 1.015 11/07/2022 1447   PHURINE 5.0 11/07/2022 1447   GLUCOSEU NEGATIVE 11/07/2022 1447   HGBUR SMALL (A) 11/07/2022 1447   BILIRUBINUR small (A) 02/21/2023 1748   KETONESUR trace (5) (A) 02/21/2023 1748   KETONESUR NEGATIVE 11/07/2022 1447   PROTEINUR =100 (A) 02/21/2023 1748   PROTEINUR 100 (A) 11/07/2022 1447   UROBILINOGEN 0.2 02/21/2023 1748   UROBILINOGEN 1.0 10/26/2014 0249   NITRITE Negative 02/21/2023 1748   NITRITE NEGATIVE 11/07/2022 1447   LEUKOCYTESUR Negative 02/21/2023 1748   LEUKOCYTESUR TRACE (A) 11/07/2022 1447   Sepsis Labs Recent Labs  Lab 03/27/23 0946 03/29/23 2020 03/30/23 0653  WBC 7.7 7.8 8.6   Microbiology Recent Results (from the past 240 hour(s))  SARS Coronavirus 2 by RT PCR (hospital order, performed in [facility] hospital lab) *cepheid single result test* Anterior Nasal Swab     Status: Abnormal   Collection Time: 03/27/23  9:46 AM   Specimen: Anterior Nasal Swab  Result Value Ref Range Status   SARS Coronavirus 2 by RT PCR POSITIVE (A) NEGATIVE Final    Comment: Performed at [facility], [address], [place], [place] [address]  Gastrointestinal Panel by PCR , Stool     Status: None   Collection Time: 03/28/23 12:56 PM   Specimen: Stool  Result Value Ref Range Status   Campylobacter species NOT DETECTED NOT DETECTED Final   Plesimonas shigelloides NOT DETECTED NOT DETECTED Final   Salmonella species NOT DETECTED NOT DETECTED Final   Yersinia enterocolitica NOT DETECTED  NOT DETECTED Final   Vibrio species NOT DETECTED NOT DETECTED Final   Vibrio cholerae NOT DETECTED NOT DETECTED Final   Enteroaggregative E coli (EAEC) NOT DETECTED NOT DETECTED Final   Enteropathogenic E coli (EPEC) NOT DETECTED NOT DETECTED Final   Enterotoxigenic E coli (ETEC) NOT DETECTED NOT DETECTED Final   Shiga like toxin producing E coli (STEC) NOT DETECTED NOT DETECTED Final   Shigella/Enteroinvasive E coli (EIEC) NOT DETECTED NOT DETECTED Final   Cryptosporidium NOT DETECTED NOT DETECTED Final   Cyclospora cayetanensis NOT DETECTED NOT DETECTED Final   Entamoeba histolytica NOT DETECTED NOT DETECTED Final   Giardia lamblia NOT DETECTED NOT DETECTED Final   Adenovirus F40/41 NOT DETECTED NOT DETECTED Final   Astrovirus NOT DETECTED NOT DETECTED Final   Norovirus GI/GII NOT DETECTED NOT DETECTED Final   Rotavirus A NOT DETECTED NOT DETECTED Final   Sapovirus (I, II, IV, and V) NOT DETECTED NOT DETECTED Final    Comment: Performed at [facility], [address] [place], [place] [address]     Patient was seen and examined on the day of discharge and was found to be in stable condition. Time coordinating discharge: 35 minutes including assessment and coordination of care, as well as examination of the patient.   SIGNED:  Noralee Stain, DO [facility] 03/30/2023, 11:50 AM

## 2023-03-30 NOTE — Progress Notes (Signed)
Received patient at bedside.  Alert and oriented.  Informed consent signed and in chart.   TX duration: 3 Hours  Patient tolerated well.  Patient remains in bed in stable condition.  Alert, without acute distress.  Hand-off given to patient's nurse.   Access used: RIJ TDC Access issues: None  Total UF removed: 3000 mL Medication(s) given: Heparin bolus 2500 Units at start of treatment Post HD VS: see Data Insert Post HD weight: Unable to obtain     03/29/23 2345  Vitals  Temp 98.3 F (36.8 C)  Temp Source Oral  BP 117/79  MAP (mmHg) 91  BP Location Right Arm  BP Method Automatic  Patient Position (if appropriate) Lying  Pulse Rate (!) 133  Pulse Rate Source Monitor  ECG Heart Rate (!) 125  Oxygen Therapy  SpO2 100 %  O2 Device Room Air  Patient Activity (if Appropriate) In bed  Pulse Oximetry Type Intermittent  During Treatment Monitoring  Blood Flow Rate (mL/min) 200 mL/min  Arterial Pressure (mmHg) 14.34 mmHg  Venous Pressure (mmHg) 81.61 mmHg  TMP (mmHg) 18.38 mmHg  Ultrafiltration Rate (mL/min) 1103 mL/min  Dialysate Flow Rate (mL/min) 299 ml/min  Dialysate Potassium Concentration 3  Dialysate Calcium Concentration 2.5  Post Treatment  Duration of HD Treatment -hour(s) 3 hour(s)  Hemodialysis Intake (mL) 0 mL  Liters Processed 72  Fluid Removed (mL) 3000.21 mL  Tolerated HD Treatment Yes  Post-Hemodialysis Comments Treatment completed without issue.  Note  Patient Observations Patient A&O x 4, no c/o voiced, no acute distress noted. VSS throughout treatment.  Fistula / Graft Left Upper arm Arteriovenous fistula  Placement Date/Time: 03/17/22 0812   Placed prior to admission: No  Orientation: Left  Access Location: Upper arm  Access Type: Arteriovenous fistula  Site Condition No complications  Fistula / Graft Assessment Present;Thrill;Bruit  Status Deaccessed  Drainage Description None  Hemodialysis Catheter Right Subclavian Double lumen Permanent  (Tunneled)  Placement Date/Time: 03/14/22 0843   Time Out: Correct patient;Correct site;Correct procedure  Maximum sterile barrier precautions: Hand hygiene;Cap;Mask;Sterile gown;Sterile gloves;Large sterile sheet  Site Prep: Chlorhexidine (preferred)  Local Anes...  Site Condition No complications  Blue Lumen Status Flushed;Heparin locked;Dead end cap in place  Red Lumen Status Flushed;Heparin locked;Dead end cap in place  Purple Lumen Status N/A  Catheter fill solution Heparin 1000 units/ml  Catheter fill volume (Arterial) 1.6 cc  Catheter fill volume (Venous) 1.6  Dressing Type Transparent  Dressing Status Antimicrobial disc in place;Clean, Dry, Intact  Drainage Description None  Post treatment catheter status Capped and Clamped        [facility]

## 2023-04-02 ENCOUNTER — Other Ambulatory Visit (INDEPENDENT_AMBULATORY_CARE_PROVIDER_SITE_OTHER): Payer: Self-pay | Admitting: *Deleted

## 2023-04-02 ENCOUNTER — Telehealth (INDEPENDENT_AMBULATORY_CARE_PROVIDER_SITE_OTHER): Payer: Self-pay | Admitting: *Deleted

## 2023-04-02 ENCOUNTER — Telehealth (INDEPENDENT_AMBULATORY_CARE_PROVIDER_SITE_OTHER): Payer: Self-pay

## 2023-04-02 NOTE — Transitions of Care (Post Inpatient/ED Visit) (Unsigned)
   04/02/2023  Name: [name] MRN: [id number] DOB: [birth year]  Today's TOC FU Call Status: Today's TOC FU Call Status:: Unsuccessful Call (1st Attempt) Unsuccessful Call (1st Attempt) Date: 04/02/23  Attempted to reach the patient regarding the most recent Inpatient/ED visit.  Follow Up Plan: Additional outreach attempts will be made to reach the patient to complete the Transitions of Care (Post Inpatient/ED visit) call.   Signature   Woodfin Ganja LPN [facility] Nurse Health Advisor Direct Dial [phone]

## 2023-04-02 NOTE — Progress Notes (Signed)
D/C order noted. Contacted [facility] to advise clinic of pt's d/c today and that pt should resume care tomorrow.   Olivia Canter Renal Navigator [phone]

## 2023-04-03 NOTE — Telephone Encounter (Signed)
Requested medications are due for refill today.  yes  Requested medications are on the active medications list.  yes  Last refill. 11/23/2022 #45 0 rf  Future visit scheduled.   yes  Notes to clinic.  Rx was recently refused for being" No appropriate". Unclear why it is inappropriate. Please review for refill.    Requested Prescriptions  Pending Prescriptions Disp Refills   colchicine 0.6 MG tablet 45 tablet 0    Sig: Take 1/2 (one-half) tablet by mouth once daily     Endocrinology:  Gout Agents - colchicine Failed - 04/02/2023 11:14 AM      Failed - Cr in normal range and within 360 days    Creatinine, Ser  Date Value Ref Range Status  03/30/2023 7.03 (H) 0.44 - 1.00 mg/dL Final   Creatinine, Urine  Date Value Ref Range Status  07/29/2021 97.28 mg/dL Final    Comment:    Performed at [facility], [address], [place], [place] [address]         Failed - CBC within normal limits and completed in the last 12 months    WBC  Date Value Ref Range Status  03/30/2023 8.6 4.0 - 10.5 K/uL Final   RBC  Date Value Ref Range Status  03/30/2023 3.16 (L) 3.87 - 5.11 MIL/uL Final   Hemoglobin  Date Value Ref Range Status  03/30/2023 9.9 (L) 12.0 - 15.0 g/dL Final  65/78/4696 29.5 11.1 - 15.9 g/dL Final   Total hemoglobin  Date Value Ref Range Status  01/11/2021 9.6 (L) 12.0 - 16.0 g/dL Final   HCT  Date Value Ref Range Status  03/30/2023 31.4 (L) 36.0 - 46.0 % Final   Hematocrit  Date Value Ref Range Status  02/21/2023 34.9 34.0 - 46.6 % Final   MCHC  Date Value Ref Range Status  03/30/2023 31.5 30.0 - 36.0 g/dL Final   [facility]  Date Value Ref Range Status  03/30/2023 31.3 26.0 - 34.0 pg Final   MCV  Date Value Ref Range Status  03/30/2023 99.4 80.0 - 100.0 fL Final  02/21/2023 96 79 - 97 fL Final   No results found for: "PLTCOUNTKUC", "LABPLAT", "POCPLA" RDW  Date Value Ref Range Status  03/30/2023 16.2 (H) 11.5 - 15.5 % Final  02/21/2023 12.7 11.7 -  15.4 % Final         Passed - ALT in normal range and within 360 days    ALT  Date Value Ref Range Status  03/27/2023 12 0 - 44 U/L Final         Passed - AST in normal range and within 360 days    AST  Date Value Ref Range Status  03/27/2023 17 15 - 41 U/L Final         Passed - Valid encounter within last 12 months    Recent Outpatient Visits           4 months ago Generalized anxiety disorder   [place] [facility] Grayce Sessions, NP   6 months ago Diabetes mellitus type 2 in obese [facility])   [place] [facility] Grayce Sessions, NP   6 months ago Diabetes mellitus type 2 in obese [facility])   [place] [facility] Hoy Register, MD   9 months ago Constipation, unspecified constipation type   [place] [facility] Grayce Sessions, NP   10 months ago Cough productive of purulent sputum  [place] [facility] Grayce Sessions, NP       Future Appointments             In 1 week Jodelle Red, MD [facility] at [facility], [place]   In 2 weeks Randa Evens, Kinnie Scales, NP [facility]   In 3 weeks Lanna Poche, Mariam Dollar, PA-C [place] [facility], [facility]

## 2023-04-03 NOTE — Transitions of Care (Post Inpatient/ED Visit) (Unsigned)
   04/03/2023  Name: [name] MRN: [id number] DOB: 03-16-1956  Today's TOC FU Call Status: Today's TOC FU Call Status:: Unsuccessful Call (2nd Attempt) Unsuccessful Call (1st Attempt) Date: 04/02/23 Unsuccessful Call (2nd Attempt) Date: 04/03/23  Attempted to reach the patient regarding the most recent Inpatient/ED visit.  Follow Up Plan: Additional outreach attempts will be made to reach the patient to complete the Transitions of Care (Post Inpatient/ED visit) call.   Signature  Woodfin Ganja LPN [facility] Nurse Health Advisor Direct Dial [phone]

## 2023-04-04 ENCOUNTER — Telehealth (INDEPENDENT_AMBULATORY_CARE_PROVIDER_SITE_OTHER): Payer: Self-pay | Admitting: Primary Care

## 2023-04-04 ENCOUNTER — Ambulatory Visit (INDEPENDENT_AMBULATORY_CARE_PROVIDER_SITE_OTHER): Payer: Self-pay | Admitting: *Deleted

## 2023-04-04 NOTE — Telephone Encounter (Signed)
  Chief Complaint: medication request: colchicine 0.6 MG tablet  Symptoms: bilateral foot pain- long term use of colchicine for pain Frequency: chronic foot pain  Disposition: [] ED /[] Urgent Care (no appt availability in office) / [] Appointment(In office/virtual)/ []  [place] Virtual [facility]/ [] Home Care/ [] Refused Recommended Disposition /[]  Mobile Bus/ [x]  Follow-up with PCP Additional Notes: Patient states she uses  colchicine 0.6 MG tablet  half tablet daily for her foot pain- she was denied a refill and now has severe bilateral foot pain. Patient is requesting reason for no refill. Patient does have upcoming appointment 04/18/23

## 2023-04-04 NOTE — Telephone Encounter (Signed)
Reason for Disposition . [1] Caller has URGENT medicine question about med that PCP or specialist prescribed AND [2] triager unable to answer question . Foot pain is a chronic symptom (recurrent or ongoing AND present > 4 weeks)  Answer Assessment - Initial Assessment Questions 1. ONSET: "When did the pain start?"      Chronic- ran out of medication- colchicine as gout preventative- she is out  2. LOCATION: "Where is the pain located?"      Both feet- instep to the toes 3. PAIN: "How bad is the pain?"    (Scale 1-10; or mild, moderate, severe)  - MILD (1-3): doesn't interfere with normal activities.   - MODERATE (4-7): interferes with normal activities (e.g., work or school) or awakens from sleep, limping.   - SEVERE (8-10): excruciating pain, unable to do any normal activities, unable to walk.      severe 4. WORK OR EXERCISE: "Has there been any recent work or exercise that involved this part of the body?"      Chronic foot pain 5. CAUSE: "What do you think is causing the foot pain?"     gout 6. OTHER SYMPTOMS: "Do you have any other symptoms?" (e.g., leg pain, rash, fever, numbness)     Some swelling- has appointment  Answer Assessment - Initial Assessment Questions 1. NAME of MEDICINE: "What medicine(s) are you calling about?"     colchicine 0.6 MG tablet 2. QUESTION: "What is your question?" (e.g., double dose of medicine, side effect)     Patient states she has been taking this medication daily as preventative for gout/foot pain. Patient states she is out and having pain 3. PRESCRIBER: "Who prescribed the medicine?" Reason: if prescribed by specialist, call should be referred to that group.     PCP 4. SYMPTOMS: "Do you have any symptoms?" If Yes, ask: "What symptoms are you having?"  "How bad are the symptoms (e.g., mild, moderate, severe)     Bilateral foot pain  Protocols used: Foot Pain-A-AH, Medication Question Call-A-AH

## 2023-04-04 NOTE — Transitions of Care (Post Inpatient/ED Visit) (Signed)
   04/04/2023  Name: [name] MRN: [id number] DOB: 12/28/1955  Today's TOC FU Call Status: Today's TOC FU Call Status:: Unsuccessful Call (3rd Attempt) Unsuccessful Call (1st Attempt) Date: 04/02/23 Unsuccessful Call (2nd Attempt) Date: 04/03/23 Unsuccessful Call (3rd Attempt) Date: 04/04/23 [facility] FU Call Complete Date: 04/04/23  Attempted to reach the patient regarding the most recent Inpatient/ED visit.  Follow Up Plan: No further outreach attempts will be made at this time. We have been unable to contact the patient.  Signature   Woodfin Ganja LPN [facility] Nurse Health Advisor Direct Dial [phone]

## 2023-04-04 NOTE — Telephone Encounter (Signed)
OPENED IN ERROR

## 2023-04-05 NOTE — Telephone Encounter (Signed)
Will forward to provider  

## 2023-04-06 ENCOUNTER — Other Ambulatory Visit (INDEPENDENT_AMBULATORY_CARE_PROVIDER_SITE_OTHER): Payer: Self-pay | Admitting: Primary Care

## 2023-04-06 ENCOUNTER — Ambulatory Visit (HOSPITAL_BASED_OUTPATIENT_CLINIC_OR_DEPARTMENT_OTHER): Payer: 59 | Admitting: Cardiology

## 2023-04-06 NOTE — Telephone Encounter (Signed)
Contacted pt to go over provider response pt didn't answer lvm  

## 2023-04-10 ENCOUNTER — Ambulatory Visit (HOSPITAL_BASED_OUTPATIENT_CLINIC_OR_DEPARTMENT_OTHER): Payer: 59 | Admitting: Cardiology

## 2023-04-13 ENCOUNTER — Other Ambulatory Visit (HOSPITAL_COMMUNITY): Payer: Self-pay | Admitting: Internal Medicine

## 2023-04-13 DIAGNOSIS — E1169 Type 2 diabetes mellitus with other specified complication: Secondary | ICD-10-CM

## 2023-04-18 ENCOUNTER — Inpatient Hospital Stay (INDEPENDENT_AMBULATORY_CARE_PROVIDER_SITE_OTHER): Payer: 59 | Admitting: Primary Care

## 2023-04-19 ENCOUNTER — Other Ambulatory Visit (INDEPENDENT_AMBULATORY_CARE_PROVIDER_SITE_OTHER): Payer: Self-pay | Admitting: Primary Care

## 2023-04-19 DIAGNOSIS — K3184 Gastroparesis: Secondary | ICD-10-CM

## 2023-04-19 NOTE — Telephone Encounter (Signed)
Requested medication (s) are due for refill today: expired medication  Requested medication (s) are on the active medication list: yes   Last refill:  04/17/22 #90 11 refills  Future visit scheduled: yes in 1 week  Notes to clinic:  expired medication do you want to reorder Rx?     Requested Prescriptions  Pending Prescriptions Disp Refills   pantoprazole (PROTONIX) 40 MG tablet [Pharmacy Med Name: pantoprazole 40 mg tablet,delayed release] 90 tablet 11    Sig: TAKE ONE TABLET BY MOUTH DAILY AT 9AM     Gastroenterology: Proton Pump Inhibitors Passed - 04/19/2023  1:38 AM      Passed - Valid encounter within last 12 months    Recent Outpatient Visits           5 months ago Generalized anxiety disorder   [place] [facility] Grayce Sessions, NP   6 months ago Diabetes mellitus type 2 in obese [facility])   [place] [facility] Grayce Sessions, NP   7 months ago Diabetes mellitus type 2 in obese [facility])   [place] [facility] Tarnov, Odette Horns, MD   9 months ago Constipation, unspecified constipation type   [place] [facility] Grayce Sessions, NP   11 months ago Cough productive of purulent sputum   [place] [facility] Grayce Sessions, NP       Future Appointments             In 6 days [place], Mariam Dollar, PA-C  [facility] at [facility], [facility]   In 1 week Randa Evens, Kinnie Scales, NP  [facility]

## 2023-04-19 NOTE — Telephone Encounter (Signed)
Will forward to provider  

## 2023-04-20 NOTE — Telephone Encounter (Signed)
Will forward to provider  

## 2023-04-24 ENCOUNTER — Other Ambulatory Visit (INDEPENDENT_AMBULATORY_CARE_PROVIDER_SITE_OTHER): Payer: Self-pay | Admitting: Primary Care

## 2023-04-24 DIAGNOSIS — K3184 Gastroparesis: Secondary | ICD-10-CM

## 2023-04-24 MED ORDER — PANTOPRAZOLE SODIUM 40 MG PO TBEC
40.0000 mg | DELAYED_RELEASE_TABLET | Freq: Every day | ORAL | 0 refills | Status: DC
Start: 2023-04-24 — End: 2024-04-25

## 2023-04-24 NOTE — Progress Notes (Deleted)
Electrophysiology Office Note:   Date:  04/24/2023  ID:  Laurabeth Dayan, DOB 08/30/1955, MRN [id number]  Primary Cardiologist: Jodelle Red, MD Electrophysiologist: Lanier Prude, MD  {Click to update primary MD,subspecialty MD or APP then REFRESH:1}    History of Present Illness:   [name] is a 67 y.o. female with h/o persistent AF, AFL, VHD (mitral/tricuspid), NICM, HFrEF, HTN, DVT,  seen today for routine electrophysiology followup.   Recent admit from 8/6/-03/30/23 with weakness and nausea. Vomited during her session of HD. She was in [facility], found to be COVID positive with largely gastroenteritis as symptoms.   Since last being seen in our clinic the patient reports doing ***.  she denies chest pain, palpitations, dyspnea, PND, orthopnea, nausea, vomiting, dizziness, syncope, edema, weight gain, or early satiety.   Review of systems complete and found to be negative unless listed in HPI.   EP Information / Studies Reviewed:    EKG is ordered today. Personal review as below.      Studies ECHO 02/2022 > LVEF 50-55%, LA/RA severely dilated    Arrhythmia / AAD Hx Persistent AF - dating back to 2011, DCCV 03/2020 with ERAF 2022 > Failed tikosyn, started on amiodarone  04/19/2021 DCCV 04/26/2021 Back in AF 11/2021 DCCV during admit > NSR  12/2021 Deemed not an ablation candidate, nor AVJ ablation with PPM 01/2022 DCCV for AFlutter    Risk Assessment/Calculations:    CHA2DS2-VASc Score = 6  {Confirm score is correct.  If not, click here to update score.  REFRESH note.  :1} This indicates a 9.7% annual risk of stroke. The patient's score is based upon: CHF History: 1 HTN History: 1 Diabetes History: 1 Stroke History: 0 Vascular Disease History: 1 Age Score: 1 Gender Score: 1   {This patient has a significant risk of stroke if diagnosed with atrial fibrillation.  Please consider VKA or DOAC agent for anticoagulation if the bleeding risk is  acceptable.   You can also use the SmartPhrase .[email or web address] for documentation.   :[id number]} No BP recorded.  {Refresh Note OR Click here to enter BP  :1}***        Physical Exam:   VS:  There were no vitals taken for this visit.   Wt Readings from Last 3 Encounters:  03/30/23 277 lb 3.2 oz (125.7 kg)  03/17/23 278 lb (126.1 kg)  02/21/23 276 lb (125.2 kg)     GEN: Well nourished, well developed in no acute distress NECK: No JVD; No carotid bruits CARDIAC: {EPRHYTHM:28826}, no murmurs, rubs, gallops RESPIRATORY:  Clear to auscultation without rales, wheezing or rhonchi  ABDOMEN: Soft, non-tender, non-distended EXTREMITIES:  No edema; No deformity   ASSESSMENT AND PLAN:    Persistent Atrial Fibrillation / AFL  TSH normal in 02/2023  -continue eliquis, dose reviewed / appropriate by age / wt -continue amiodarone, lopressor -repeat TSH, free T4 in 6 mo -CMP reviewed for LFT's 03/27/23, normal   Secondary Hypercoagulable State  CHA2DS2-VASc 6 / 9.7% annual risk of CVA -continue eliquis 5mg  BID   Chronic Combined Systolic & Diastolic CHF  NYHA III-IV -euvolemic on exam ***  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 Cristal Deer   ESRD on HD  T/Th/S -per Nephrology   OSA  -CPAP compliance encouraged  Follow up with {YQMVH:84696} {EPFOLLOW EX:52841}  Signed, Canary Brim, MSN, APRN, NP-C, AGACNP-BC  [facility] - Electrophysiology  04/24/2023, 2:08 PM

## 2023-04-25 ENCOUNTER — Other Ambulatory Visit (INDEPENDENT_AMBULATORY_CARE_PROVIDER_SITE_OTHER): Payer: Self-pay | Admitting: Family Medicine

## 2023-04-25 ENCOUNTER — Ambulatory Visit: Payer: 59 | Attending: Student | Admitting: Student

## 2023-04-25 DIAGNOSIS — I5042 Chronic combined systolic (congestive) and diastolic (congestive) heart failure: Secondary | ICD-10-CM

## 2023-04-25 DIAGNOSIS — I4819 Other persistent atrial fibrillation: Secondary | ICD-10-CM

## 2023-04-25 DIAGNOSIS — Z9889 Other specified postprocedural states: Secondary | ICD-10-CM

## 2023-04-25 DIAGNOSIS — N186 End stage renal disease: Secondary | ICD-10-CM

## 2023-04-25 DIAGNOSIS — G4733 Obstructive sleep apnea (adult) (pediatric): Secondary | ICD-10-CM

## 2023-04-26 ENCOUNTER — Encounter: Payer: Self-pay | Admitting: Student

## 2023-04-27 ENCOUNTER — Ambulatory Visit (INDEPENDENT_AMBULATORY_CARE_PROVIDER_SITE_OTHER): Payer: Self-pay | Admitting: *Deleted

## 2023-04-27 NOTE — Telephone Encounter (Signed)
  Reason for Disposition  [1] Caller has NON-URGENT medicine question about med that PCP prescribed AND [2] triager unable to answer question  Answer Assessment - Initial Assessment Questions 1. NAME of MEDICINE: "What medicine(s) are you calling about?"     Colchicine for my gout.    I'm having a gout flare up and am in pain. 2. QUESTION: "What is your question?" (e.g., double dose of medicine, side effect)     Why will Milinda Hirschfeld, NP not refill my colchicine?  I've been trying for 3 months to get it refilled. 3. PRESCRIBER: "Who prescribed the medicine?" Reason: if prescribed by specialist, call should be referred to that group.     Gwinda Passe, NP 4. SYMPTOMS: "Do you have any symptoms?" If Yes, ask: "What symptoms are you having?"  "How bad are the symptoms (e.g., mild, moderate, severe)     Gout flare up 5. PREGNANCY:  "Is there any chance that you are pregnant?" "When was your last menstrual period?"     N/A due to age  Protocols used: Medication Question Call-A-AH

## 2023-04-27 NOTE — Telephone Encounter (Signed)
Will forward to provider  

## 2023-04-27 NOTE — Telephone Encounter (Addendum)
  Chief Complaint: Needing colchicine refilled.   Per note from Gwinda Passe, NP dated 04/06/2023 at 2:11 PM she isn't refilling the colchicine because of it interacting with Amiodarone.   No one told pt this. Symptoms: She is having a gout flare up and in is pain.    Frequency: Trying for 3 months to get a refill Pertinent Negatives: Patient denies knowing why they keep refusing it. Disposition: [] ED /[] Urgent Care (no appt availability in office) / [] Appointment(In office/virtual)/ []  [place] Virtual Care/ [] Home Care/ [] Refused Recommended Disposition /[] [place] Mobile Bus/ [x]  Follow-up with PCP Additional Notes: I let pt know why it was not refilled from Michelle's note on 04/06/2023.    Pt. Has an appt with Marcelino Duster on Monday 04/30/2023.   I encouraged her to mention this to [facility] and I let her know I would send a message to [place] also.   Pt. Thanked me for my help and telling her why it wasn't refilled.

## 2023-04-30 ENCOUNTER — Inpatient Hospital Stay (INDEPENDENT_AMBULATORY_CARE_PROVIDER_SITE_OTHER): Payer: 59 | Admitting: Primary Care

## 2023-04-30 ENCOUNTER — Telehealth (INDEPENDENT_AMBULATORY_CARE_PROVIDER_SITE_OTHER): Payer: Self-pay | Admitting: Primary Care

## 2023-04-30 NOTE — Telephone Encounter (Signed)
Called pt and left vm to call office back to reschedule HFU due to provider not being in office today.

## 2023-05-03 ENCOUNTER — Telehealth (INDEPENDENT_AMBULATORY_CARE_PROVIDER_SITE_OTHER): Payer: Self-pay | Admitting: Primary Care

## 2023-05-03 ENCOUNTER — Encounter (INDEPENDENT_AMBULATORY_CARE_PROVIDER_SITE_OTHER): Payer: Self-pay | Admitting: Primary Care

## 2023-05-03 DIAGNOSIS — M1A9XX0 Chronic gout, unspecified, without tophus (tophi): Secondary | ICD-10-CM

## 2023-05-03 MED ORDER — PREDNISONE 10 MG (21) PO TBPK: ORAL_TABLET | ORAL | 0 refills | Status: DC

## 2023-05-03 NOTE — Telephone Encounter (Signed)
Called and left voicemail on patients phone.  I spoke with Dr. Milas Kocher was not suppose to be on colchicine asked recommendation other than prednisone  Per your cardiologist "Hey. Cant use colchcine due to ckd mainly. Prednisone really best short term option. Would send to Aline August in rheumatology for longer term solution.

## 2023-05-07 ENCOUNTER — Telehealth (INDEPENDENT_AMBULATORY_CARE_PROVIDER_SITE_OTHER): Payer: Self-pay | Admitting: Primary Care

## 2023-05-07 NOTE — Telephone Encounter (Signed)
Copied from CRM [id number]. Topic: Referral - Status >> May 07, 2023  8:25 AM Dondra Prader A wrote: Reason for CRM: Ladona Ridgel with [facility] states that she received a referral for the pt and she still has not received the pt labs. Ladona Ridgel states that she will need the pt labs in order to keep the referral. Please advise.  Fax number: [phone] Ladona Ridgel phone number: [phone] ext [phone]

## 2023-05-07 NOTE — Telephone Encounter (Signed)
Returned call to [place] and made her aware why the referral was placed. Made [name] aware that labs have been done by the hospital and cardiology. Per [name] she will notate it

## 2023-05-20 ENCOUNTER — Other Ambulatory Visit (HOSPITAL_COMMUNITY): Payer: Self-pay | Admitting: Internal Medicine

## 2023-06-04 ENCOUNTER — Encounter (INDEPENDENT_AMBULATORY_CARE_PROVIDER_SITE_OTHER): Payer: Self-pay

## 2023-06-04 ENCOUNTER — Ambulatory Visit (INDEPENDENT_AMBULATORY_CARE_PROVIDER_SITE_OTHER): Payer: 59

## 2023-06-04 VITALS — Ht 67.0 in | Wt 275.0 lb

## 2023-06-04 DIAGNOSIS — Z0000 Encounter for general adult medical examination without abnormal findings: Secondary | ICD-10-CM

## 2023-06-04 DIAGNOSIS — Z1231 Encounter for screening mammogram for malignant neoplasm of breast: Secondary | ICD-10-CM | POA: Diagnosis not present

## 2023-06-04 DIAGNOSIS — Z780 Asymptomatic menopausal state: Secondary | ICD-10-CM

## 2023-06-04 NOTE — Patient Instructions (Signed)
[name] , Thank you for taking time to come for your Medicare Wellness Visit. I appreciate your ongoing commitment to your health goals. Please review the following plan we discussed and let me know if I can assist you in the future.   Referrals/Orders/Follow-Ups/Clinician Recommendations:  Next Medicare Annual Wellness Visit: June 09, 2024 at 8:40am virtual visit  You have an order for:  []   2D Mammogram  [x]   3D Mammogram  [x]   Bone Density     Please call for appointment:   [facility] [address] [place], [place] [address] [phone]  Make sure to wear two-piece clothing.  No lotions powders or deodorants the day of the appointment Make sure to bring picture ID and insurance card.  Bring list of medications you are currently taking including any supplements.   Schedule your [place] screening mammogram through MyChart!   Log into your MyChart account.  Go to 'Visit' (or 'Appointments' if on mobile App) --> Schedule an Appointment  Under 'Select a Reason for Visit' choose the Mammogram Screening option.  Complete the pre-visit questions and select the time and place that best fits your schedule.   This is a list of the screening recommended for you and due dates:  Health Maintenance  Topic Date Due   Eye exam for diabetics  Never done   Zoster (Shingles) Vaccine (1 of 2) Never done   Pneumonia Vaccine (2 of 2 - PCV) 02/26/2013   COVID-19 Vaccine (3 - Pfizer risk series) 01/01/2020   DEXA scan (bone density measurement)  Never done   Mammogram  12/24/2022   Flu Shot  03/22/2023   Complete foot exam   06/08/2023   Hemoglobin A1C  09/15/2023   Medicare Annual Wellness Visit  06/03/2024   Colon Cancer Screening  03/04/2025   DTaP/Tdap/Td vaccine (2 - Td or Tdap) 03/03/2029   Hepatitis C Screening  Completed   HPV Vaccine  Aged Out    Advanced directives: (Provided) Advance directive discussed with you today. I have  provided a copy for you to complete at home and have notarized. Once this is complete, please bring a copy in to our office so we can scan it into your chart.   Next Medicare Annual Wellness Visit scheduled for next year: Yes  Preventive Care 59 Years and Older, Female Preventive care refers to lifestyle choices and visits with your health care provider that can promote health and wellness. Preventive care visits are also called wellness exams. What can I expect for my preventive care visit? Counseling Your health care provider may ask you questions about your: Medical history, including: Past medical problems. Family medical history. Pregnancy and menstrual history. History of falls. Current health, including: Memory and ability to understand (cognition). Emotional well-being. Home life and relationship well-being. Sexual activity and sexual health. Lifestyle, including: Alcohol, nicotine or tobacco, and drug use. Access to firearms. Diet, exercise, and sleep habits. Work and work Astronomer. Sunscreen use. Safety issues such as seatbelt and bike helmet use. Physical exam Your health care provider will check your: Height and weight. These may be used to calculate your BMI (body mass index). BMI is a measurement that tells if you are at a healthy weight. Waist circumference. This measures the distance around your waistline. This measurement also tells if you are at a healthy weight and may help predict your risk of certain diseases, such as type 2 diabetes and high blood pressure. Heart rate and blood pressure.  Body temperature. Skin for abnormal spots. What immunizations do I need?  Vaccines are usually given at various ages, according to a schedule. Your health care provider will recommend vaccines for you based on your age, medical history, and lifestyle or other factors, such as travel or where you work. What tests do I need? Screening Your health care provider may  recommend screening tests for certain conditions. This may include: Lipid and cholesterol levels. Hepatitis C test. Hepatitis B test. HIV (human immunodeficiency virus) test. STI (sexually transmitted infection) testing, if you are at risk. Lung cancer screening. Colorectal cancer screening. Diabetes screening. This is done by checking your blood sugar (glucose) after you have not eaten for a while (fasting). Mammogram. Talk with your health care provider about how often you should have regular mammograms. BRCA-related cancer screening. This may be done if you have a family history of breast, ovarian, tubal, or peritoneal cancers. Bone density scan. This is done to screen for osteoporosis. Talk with your health care provider about your test results, treatment options, and if necessary, the need for more tests. Follow these instructions at home: Eating and drinking  Eat a diet that includes fresh fruits and vegetables, whole grains, lean protein, and low-fat dairy products. Limit your intake of foods with high amounts of sugar, saturated fats, and salt. Take vitamin and mineral supplements as recommended by your health care provider. Do not drink alcohol if your health care provider tells you not to drink. If you drink alcohol: Limit how much you have to 0-1 drink a day. Know how much alcohol is in your drink. In the [place] one drink equals one 12 oz bottle of beer (355 mL), one 5 oz glass of wine (148 mL), or one 1 oz glass of hard liquor (44 mL). Lifestyle Brush your teeth every morning and night with fluoride toothpaste. Floss one time each day. Exercise for at least 30 minutes 5 or more days each week. Do not use any products that contain nicotine or tobacco. These products include cigarettes, chewing tobacco, and vaping devices, such as e-cigarettes. If you need help quitting, ask your health care provider. Do not use drugs. If you are sexually active, practice safe sex. Use a condom  or other form of protection in order to prevent STIs. Take aspirin only as told by your health care provider. Make sure that you understand how much to take and what form to take. Work with your health care provider to find out whether it is safe and beneficial for you to take aspirin daily. Ask your health care provider if you need to take a cholesterol-lowering medicine (statin). Find healthy ways to manage stress, such as: Meditation, yoga, or listening to music. Journaling. Talking to a trusted person. Spending time with friends and family. Minimize exposure to UV radiation to reduce your risk of skin cancer. Safety Always wear your seat belt while driving or riding in a vehicle. Do not drive: If you have been drinking alcohol. Do not ride with someone who has been drinking. When you are tired or distracted. While texting. If you have been using any mind-altering substances or drugs. Wear a helmet and other protective equipment during sports activities. If you have firearms in your house, make sure you follow all gun safety procedures. What's next? Visit your health care provider once a year for an annual wellness visit. Ask your health care provider how often you should have your eyes and teeth checked. Stay up to date on all  vaccines. This information is not intended to replace advice given to you by your health care provider. Make sure you discuss any questions you have with your health care provider. Document Revised: 02/02/2021 Document Reviewed: 02/02/2021 Elsevier Patient Education  2024 [employer]. Understanding Your Risk for Falls Millions of people have serious injuries from falls each year. It is important to understand your risk of falling. Talk with your health care provider about your risk and what you can do to lower it. If you do have a serious fall, make sure to tell your provider. Falling once raises your risk of falling again. How can falls affect me? Serious  injuries from falls are common. These include: Broken bones, such as hip fractures. Head injuries, such as traumatic brain injuries (TBI) or concussions. A fear of falling can cause you to avoid activities and stay at home. This can make your muscles weaker and raise your risk for a fall. What can increase my risk? There are a number of risk factors that increase your risk for falling. The more risk factors you have, the higher your risk of falling. Serious injuries from a fall happen most often to people who are older than 67 years old. Teenagers and young adults ages 44-29 are also at higher risk. Common risk factors include: Weakness in the lower body. Being generally weak or confused due to long-term (chronic) illness. Dizziness or balance problems. Poor vision. Medicines that cause dizziness or drowsiness. These may include: Medicines for your blood pressure, heart, anxiety, insomnia, or swelling (edema). Pain medicines. Muscle relaxants. Other risk factors include: Drinking alcohol. Having had a fall in the past. Having foot pain or wearing improper footwear. Working at a dangerous job. Having any of the following in your home: Tripping hazards, such as floor clutter or loose rugs. Poor lighting. Pets. Having dementia or memory loss. What actions can I take to lower my risk of falling?     Physical activity Stay physically fit. Do strength and balance exercises. Consider taking a regular class to build strength and balance. Yoga and tai chi are good options. Vision Have your eyes checked every year and your prescription for glasses or contacts updated as needed. Shoes and walking aids Wear non-skid shoes. Wear shoes that have rubber soles and low heels. Do not wear high heels. Do not walk around the house in socks or slippers. Use a cane or walker as told by your provider. Home safety Attach secure railings on both sides of your stairs. Install grab bars for your  bathtub, shower, and toilet. Use a non-skid mat in your bathtub or shower. Attach bath mats securely with double-sided, non-slip rug tape. Use good lighting in all rooms. Keep a flashlight near your bed. Make sure there is a clear path from your bed to the bathroom. Use night-lights. Do not use throw rugs. Make sure all carpeting is taped or tacked down securely. Remove all clutter from walkways and stairways, including extension cords. Repair uneven or broken steps and floors. Avoid walking on icy or slippery surfaces. Walk on the grass instead of on icy or slick sidewalks. Use ice melter to get rid of ice on walkways in the winter. Use a cordless phone. Questions to ask your health care provider Can you help me check my risk for a fall? Do any of my medicines make me more likely to fall? Should I take a vitamin D supplement? What exercises can I do to improve my strength and balance? Should I  make an appointment to have my vision checked? Do I need a bone density test to check for weak bones (osteoporosis)? Would it help to use a cane or a walker? Where to find more information Centers for Disease Control and Prevention, STEAD[employer]: TonerPromos.no Community-Based Fall Prevention Programs: TonerPromos.no [employer] on Aging: BaseRingTones.pl Contact a health care provider if: You fall at home. You are afraid of falling at home. You feel weak, drowsy, or dizzy. This information is not intended to replace advice given to you by your health care provider. Make sure you discuss any questions you have with your health care provider. Document Revised: 04/10/2022 Document Reviewed: 04/10/2022 Elsevier Patient Education  2024 Elsevier Inc. Bone Density Test A bone density test uses a type of X-ray to measure the amount of calcium and other minerals in a person's bones. It can measure bone density in the hip and the spine. The test is similar to having a regular X-ray. This test may also be called: Bone  densitometry. Bone mineral density test. Dual-energy X-ray absorptiometry (DEXA). You may have this test to: Diagnose a condition that causes weak or thin bones (osteoporosis). Screen you for osteoporosis. Predict your risk for a broken bone (fracture). Determine how well your osteoporosis treatment is working. Tell a health care provider about: Any allergies you have. All medicines you are taking, including vitamins, herbs, eye drops, creams, and over-the-counter medicines. Any problems you or family members have had with anesthetic medicines. Any blood disorders you have. Any surgeries you have had. Any medical conditions you have. Whether you are pregnant or may be pregnant. Any medical tests you have had within the past 14 days that used contrast material. What are the risks? Generally, this is a safe test. However, it does expose you to a small amount of radiation, which can slightly increase your cancer risk. What happens before the test? Do not take any calcium supplements within the 24 hours before your test. You will need to remove all metal jewelry, eyeglasses, removable dental appliances, and any other metal objects on your body. What happens during the test?  You will lie down on an exam table. There will be an X-ray generator below you and an imaging device above you. Other devices, such as boxes or braces, may be used to position your body properly for the scan. The machine will slowly scan your body. You will need to keep very still while the machine does the scan. The images will show up on a screen in the room. Images will be examined by a specialist after your test is finished. The procedure may vary among health care providers and hospitals. What can I expect after the test? It is up to you to get the results of your test. Ask your health care provider, or the department that is doing the test, when your results will be ready. Summary A bone density test is an  imaging test that uses a type of X-ray to measure the amount of calcium and other minerals in your bones. The test may be used to diagnose or screen you for a condition that causes weak or thin bones (osteoporosis), predict your risk for a broken bone (fracture), or determine how well your osteoporosis treatment is working. Do not take any calcium supplements within 24 hours before your test. Ask your health care provider, or the department that is doing the test, when your results will be ready. This information is not intended to replace advice given to you  by your health care provider. Make sure you discuss any questions you have with your health care provider. Document Revised: 04/20/2021 Document Reviewed: 01/22/2020 Elsevier Patient Education  2024 [employer].

## 2023-06-04 NOTE — Progress Notes (Signed)
 Because this visit was a virtual/telehealth visit,  certain criteria was not obtained, such a blood pressure, CBG if applicable, and timed get up and go. Any medications not marked as "taking" were not mentioned during the medication reconciliation part of the visit. Any vitals not documented were not able to be obtained due to this being a telehealth visit or patient was unable to self-report a recent blood pressure reading due to a lack of equipment at home via telehealth. Vitals that have been documented are verbally provided by the patient.   Subjective:   [name] is a 67 y.o. female who presents for Medicare Annual (Subsequent) preventive examination.  Visit Complete: Virtual I connected with  [name] on 06/04/23 by a audio enabled telemedicine application and verified that I am speaking with the correct person using two identifiers.  Patient Location: Home  Provider Location: Home Office  I discussed the limitations of evaluation and management by telemedicine. The patient expressed understanding and agreed to proceed.  Vital Signs: Because this visit was a virtual/telehealth visit, some criteria may be missing or patient reported. Any vitals not documented were not able to be obtained and vitals that have been documented are patient reported.  Patient Medicare AWV questionnaire was completed by the patient on na; I have confirmed that all information answered by patient is correct and no changes since this date.  Cardiac Risk Factors include: advanced age (>50men, >62 women);diabetes mellitus;dyslipidemia;hypertension;obesity (BMI >30kg/m2);sedentary lifestyle;Other (see comment), Risk factor comments: heart failure     Objective:    Today's Vitals   06/04/23 1348  Weight: 275 lb (124.7 kg)  Height: 5\' 7"  (1.702 m)  PainSc: 9    Body mass index is 43.07 kg/m.     06/04/2023    1:47 PM 03/27/2023    4:23 PM 03/27/2023    9:48 AM 03/16/2023    2:00  AM 03/14/2023   11:04 AM 02/24/2023   11:43 AM 02/21/2023    5:45 PM  Advanced Directives  Does Patient Have a Medical Advance Directive? No  No No No No No  Would patient like information on creating a medical advance directive? No - Patient declined Yes (Inpatient - patient requests chaplain consult to create a medical advance directive)  No - Patient declined   No - Patient declined    Current Medications (verified) Outpatient Encounter Medications as of 06/04/2023  Medication Sig   acetaminophen (TYLENOL) 325 MG tablet Take 2 tablets (650 mg total) by mouth every 6 (six) hours as needed for mild pain or moderate pain.   albuterol (PROVENTIL HFA) 108 (90 Base) MCG/ACT inhaler Inhale 1-2 puffs into the lungs every 6 (six) hours as needed for wheezing or shortness of breath.   amiodarone (PACERONE) 200 MG tablet TAKE 1 TABLET BY MOUTH EVERY DAY   apixaban (ELIQUIS) 5 MG TABS tablet Take 1 tablet (5 mg total) by mouth 2 (two) times daily.   Blood Glucose Monitoring Suppl (ACCU-CHEK GUIDE) w/Device KIT Use to check blood sugar TID. Dx: E11.69 (Patient taking differently: Use to check blood sugar TID. Dx: E11.69 Last reading: 105, 02-20-2023, Fasting.)   busPIRone (BUSPAR) 5 MG tablet TAKE ONE TABLET BY MOUTH TWICE DAILY @ 9AM & 5PM   colchicine 0.6 MG tablet Take 1/2 (one-half) tablet by mouth once daily   Dulaglutide (TRULICITY) 1.5 MG/0.5ML SOPN Inject 1.5 mg into the skin once a week.   DULoxetine (CYMBALTA) 60 MG capsule TAKE ONE CAPSULE (60MG  TOTAL)  BY MOUTH DAILY AT 9AM   EPINEPHrine 0.3 mg/0.3 mL IJ SOAJ injection Inject 0.3 mg into the muscle as needed for anaphylaxis.   glucose blood (ACCU-CHEK GUIDE) test strip USE TO CHECK BLOOD SUGAR 3 TIMES DAILY   insulin lispro (HUMALOG KWIKPEN) 100 UNIT/ML KwikPen Inject 10 Units into the skin 3 (three) times daily. (Patient taking differently: Inject 0-5 Units into the skin 3 (three) times daily. Sliding scale)   lidocaine-prilocaine (EMLA) cream  Apply 1 Application topically Every Tuesday,Thursday,and Saturday with dialysis.   loratadine (CLARITIN) 10 MG tablet TAKE ONE TABLET BY MOUTH DAILY AT 9AM   meclizine (ANTIVERT) 25 MG tablet Take 1 tablet (25 mg total) by mouth 3 (three) times daily as needed for dizziness.   metoprolol tartrate (LOPRESSOR) 25 MG tablet Take 0.5 tablets (12.5 mg total) by mouth 2 (two) times daily.   midodrine (PROAMATINE) 10 MG tablet Take 1 tablet (10 mg total) by mouth See admin instructions. Take 10mg  (1 tablet) by mouth on Tuesday's, Thursday's, and Saturday's.   Multiple Vitamins-Minerals (MULTIVITAMIN WOMEN PO) Take 1 tablet by mouth daily.   nitroGLYCERIN (NITROSTAT) 0.4 MG SL tablet Place 1 tablet (0.4 mg total) under the tongue every 5 (five) minutes x 3 doses as needed for chest pain.   ondansetron (ZOFRAN) 4 MG tablet Take 1 tablet (4 mg total) by mouth every 6 (six) hours. (Patient taking differently: Take 4 mg by mouth every 6 (six) hours as needed for nausea or vomiting.)   pantoprazole (PROTONIX) 40 MG tablet Take 1 tablet (40 mg total) by mouth daily.   predniSONE (STERAPRED UNI-PAK 21 TAB) 10 MG (21) TBPK tablet Prednisone dose pack directions Day 1 take 6 tablets Day 2 take 5 tablets Day 3 take 4 tablets Day 4 take 3 tablets Day 3 take 2 tablets Day 4 take 1 tablet Dispense 21 tablets   rosuvastatin (CRESTOR) 10 MG tablet TAKE ONE TABLET BY MOUTH DAILY AT 5PM   No facility-administered encounter medications on file as of 06/04/2023.    Allergies (verified) Bee pollen, Bee venom, Sulfa antibiotics, Chlorhexidine, and Iodinated contrast media   History: Past Medical History:  Diagnosis Date   Acute on chronic systolic CHF (congestive heart failure) (HCC) 12/21/2020   Allergic rhinitis    Anemia    Arthritis    Asthma    Chronic diastolic CHF (congestive heart failure) (HCC)    Chronic kidney disease    Dialysis Tu/TH/Sa   COPD (chronic obstructive pulmonary disease) (HCC)    Depression     DM (diabetes mellitus) (HCC)    DVT (deep vein thrombosis) in pregnancy    GERD (gastroesophageal reflux disease)    HTN (hypertension)    Hyperlipidemia    Obesity    Pneumonia 04/2018   RIGHT LOBE   Sleep apnea    needs to be retested - to get new cpap   Past Surgical History:  Procedure Laterality Date   ABDOMINAL HYSTERECTOMY     AV FISTULA PLACEMENT Left 03/17/2022   Procedure: LEFT ARM ARTERIOVENOUS (AV) FISTULA CREATION;  Surgeon: Chuck Hint, MD;  Location: [facility] OR;  Service: Vascular;  Laterality: Left;   BIOPSY  01/12/2023   Procedure: BIOPSY;  Surgeon: Jeani Hawking, MD;  Location: [facility] ENDOSCOPY;  Service: Gastroenterology;;   Thressa Sheller STUDY  11/26/2020   Procedure: BUBBLE STUDY;  Surgeon: Jodelle Red, MD;  Location: [facility] ENDOSCOPY;  Service: Cardiovascular;;   CARDIOVERSION N/A 05/06/2020   Procedure: CARDIOVERSION;  Surgeon: Jodelle Red, MD;  Location: [facility] ENDOSCOPY;  Service: Cardiovascular;  Laterality: N/A;   CARDIOVERSION N/A 11/04/2020   Procedure: CARDIOVERSION;  Surgeon: Lewayne Bunting, MD;  Location: [facility] ENDOSCOPY;  Service: Cardiovascular;  Laterality: N/A;   CARDIOVERSION N/A 02/01/2021   Procedure: CARDIOVERSION;  Surgeon: Dolores Patty, MD;  Location: [facility] ENDOSCOPY;  Service: Cardiovascular;  Laterality: N/A;   CARDIOVERSION N/A 02/09/2021   Procedure: CARDIOVERSION;  Surgeon: Dolores Patty, MD;  Location: [facility] ENDOSCOPY;  Service: Cardiovascular;  Laterality: N/A;   CARDIOVERSION N/A 04/19/2021   Procedure: CARDIOVERSION;  Surgeon: Pricilla Riffle, MD;  Location: [facility] ENDOSCOPY;  Service: Cardiovascular;  Laterality: N/A;   CARDIOVERSION N/A 11/25/2021   Procedure: CARDIOVERSION;  Surgeon: Dolores Patty, MD;  Location: [facility] ENDOSCOPY;  Service: Cardiovascular;  Laterality: N/A;   CARDIOVERSION N/A 01/31/2022   Procedure: CARDIOVERSION;  Surgeon: Dolores Patty, MD;  Location: [facility] ENDOSCOPY;  Service: Cardiovascular;   Laterality: N/A;   CATARACT EXTRACTION, BILATERAL     CHOLECYSTECTOMY     COLONOSCOPY WITH PROPOFOL N/A 03/05/2015   Procedure: COLONOSCOPY WITH PROPOFOL;  Surgeon: Jeani Hawking, MD;  Location: [facility] ENDOSCOPY;  Service: Endoscopy;  Laterality: N/A;   DIAGNOSTIC LAPAROSCOPY     ESOPHAGOGASTRODUODENOSCOPY (EGD) WITH PROPOFOL N/A 01/12/2023   Procedure: ESOPHAGOGASTRODUODENOSCOPY (EGD) WITH PROPOFOL;  Surgeon: Jeani Hawking, MD;  Location: [facility] ENDOSCOPY;  Service: Gastroenterology;  Laterality: N/A;   FISTULA SUPERFICIALIZATION Left 03/17/2022   Procedure: FISTULA SUPERFICIALIZATION -LEFT;  Surgeon: Chuck Hint, MD;  Location: [facility] OR;  Service: Vascular;  Laterality: Left;   ingrown hallux Left    IR FLUORO GUIDE CV LINE RIGHT  03/14/2022   IR US GUIDE VASC ACCESS RIGHT  03/14/2022   KNEE SURGERY     LEFT HEART CATH AND CORONARY ANGIOGRAPHY N/A 12/31/2017   Procedure: LEFT HEART CATH AND CORONARY ANGIOGRAPHY;  Surgeon: Rinaldo Cloud, MD;  Location: [facility] INVASIVE CV LAB;  Service: Cardiovascular;  Laterality: N/A;   MAZE N/A 12/29/2020   Procedure: MAZE;  Surgeon: Loreli Slot, MD;  Location: [facility] OR;  Service: Open Heart Surgery;  Laterality: N/A;   MITRAL VALVE REPAIR N/A 12/29/2020   Procedure: MITRAL VALVE REPAIR USING CARBOMEDICS ANNULOFLEX RING SIZE 30;  Surgeon: Loreli Slot, MD;  Location: [facility] OR;  Service: Open Heart Surgery;  Laterality: N/A;   REVISON OF ARTERIOVENOUS FISTULA Left 01/05/2023   Procedure: REVISION OF LEFT ARTERIOVENOUS FISTULA WITH INTERPOSITION PTFE GRAFT;  Surgeon: Chuck Hint, MD;  Location: [facility] OR;  Service: Vascular;  Laterality: Left;  ELIQUIS AND HOLD 48 X HOURS   RIGHT HEART CATH N/A 12/22/2020   Procedure: RIGHT HEART CATH;  Surgeon: Laurey Morale, MD;  Location: [facility] INVASIVE CV LAB;  Service: Cardiovascular;  Laterality: N/A;   RIGHT HEART CATH N/A 01/31/2021   Procedure: RIGHT HEART CATH;  Surgeon: Dolores Patty, MD;  Location: [facility]  INVASIVE CV LAB;  Service: Cardiovascular;  Laterality: N/A;   RIGHT HEART CATH N/A 07/29/2021   Procedure: RIGHT HEART CATH;  Surgeon: Dolores Patty, MD;  Location: [facility] INVASIVE CV LAB;  Service: Cardiovascular;  Laterality: N/A;   RIGHT HEART CATH N/A 11/24/2021   Procedure: RIGHT HEART CATH;  Surgeon: Dolores Patty, MD;  Location: [facility] INVASIVE CV LAB;  Service: Cardiovascular;  Laterality: N/A;   RIGHT HEART CATH N/A 02/14/2022   Procedure: RIGHT HEART CATH;  Surgeon: Dolores Patty, MD;  Location: [facility] INVASIVE CV LAB;  Service: Cardiovascular;  Laterality: N/A;   RIGHT HEART CATH  N/A 02/22/2022   Procedure: RIGHT HEART CATH;  Surgeon: Laurey Morale, MD;  Location: [facility] INVASIVE CV LAB;  Service: Cardiovascular;  Laterality: N/A;   TEE WITHOUT CARDIOVERSION N/A 11/26/2020   Procedure: TRANSESOPHAGEAL ECHOCARDIOGRAM (TEE);  Surgeon: Jodelle Red, MD;  Location: [facility] ENDOSCOPY;  Service: Cardiovascular;  Laterality: N/A;   TEE WITHOUT CARDIOVERSION N/A 12/29/2020   Procedure: TRANSESOPHAGEAL ECHOCARDIOGRAM (TEE);  Surgeon: Loreli Slot, MD;  Location: [facility] OR;  Service: Open Heart Surgery;  Laterality: N/A;   TEE WITHOUT CARDIOVERSION N/A 01/20/2022   Procedure: TRANSESOPHAGEAL ECHOCARDIOGRAM (TEE);  Surgeon: Dolores Patty, MD;  Location: [facility] ENDOSCOPY;  Service: Cardiovascular;  Laterality: N/A;   TRICUSPID VALVE REPLACEMENT N/A 12/29/2020   Procedure: 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 RING SIZE 34;  Surgeon: Loreli Slot, MD;  Location: [facility] OR;  Service: Open Heart Surgery;  Laterality: N/A;   Family History  Problem Relation Age of Onset   Breast cancer Mother    Cancer Mother    Hypertension Mother    Cancer Father    Hypertension Sister    Hypertension Brother    CAD Other    Social History   Socioeconomic History   Marital status: Widowed    Spouse name: Not on file   Number of children: 2   Years of education: Not on file    Highest education level: Some college, no degree  Occupational History   Not on file  Tobacco Use   Smoking status: Never    Passive exposure: Never   Smokeless tobacco: Never   Tobacco comments:    Never smoke 10/20/22  Vaping Use   Vaping status: Never Used  Substance and Sexual Activity   Alcohol use: No   Drug use: No   Sexual activity: Not Currently  Other Topics Concern   Not on file  Social History Narrative   Pt lives in [place] with spouse.  2 grown children.   Previously worked in [employer] at [employer].  Now on disability   Social Determinants of Health   Financial Resource Strain: Low Risk  (06/04/2023)   Overall Financial Resource Strain (CARDIA)    Difficulty of Paying Living Expenses: Not hard at all  Food Insecurity: No Food Insecurity (06/04/2023)   Hunger Vital Sign    Worried About Running Out of Food in the Last Year: Never true    Ran Out of Food in the Last Year: Never true  Transportation Needs: No Transportation Needs (06/04/2023)   PRAPARE - Administrator, Civil Service (Medical): No    Lack of Transportation (Non-Medical): No  Physical Activity: Insufficiently Active (06/04/2023)   Exercise Vital Sign    Days of Exercise per Week: 2 days    Minutes of Exercise per Session: 20 min  Stress: No Stress Concern Present (06/04/2023)   [employer] of Occupational Health - Occupational Stress Questionnaire    Feeling of Stress : Only a little  Social Connections: Moderately Isolated (06/04/2023)   Social Connection and Isolation Panel [NHANES]    Frequency of Communication with Friends and Family: More than three times a week    Frequency of Social Gatherings with Friends and Family: More than three times a week    Attends Religious Services: More than 4 times per year    Active Member of [employer] or Organizations: No    Attends Banker Meetings: Never    Marital Status: Widowed    Tobacco Counseling Counseling given:  Yes Tobacco comments: Never smoke 10/20/22   Clinical Intake:  Pre-visit preparation completed: Yes  Pain : 0-10 Pain Score: 9  Pain Type: Chronic pain Pain Location: Foot Pain Orientation: Right, Left Pain Descriptors / Indicators: Aching, Throbbing, Sharp, Shooting Pain Onset: More than a month ago Pain Frequency: Intermittent  Pain Score: 9 Pain Type: Chronic pain Pain Location: Leg Pain Orientation: Right, Left Pain Descriptors / Indicators: Aching, Throbbing, Sharp, Shooting Pain Onset: more than a month ago Pain Frequency: Intermittent  BMI - recorded: 43.07 Nutritional Status: BMI > 30  Obese Nutritional Risks: None Diabetes: Yes CBG done?: No (telehealth visit) Did pt. bring in CBG monitor from home?: No  How often do you need to have someone help you when you read instructions, pamphlets, or other written materials from your doctor or pharmacy?: 4 - Often  Interpreter Needed?: No  Information entered by ::  [name], CMA   Activities of Daily Living    06/04/2023    2:03 PM 03/27/2023    4:00 PM  In your present state of health, do you have any difficulty performing the following activities:  Hearing? 0 0  Vision? 0 0  Difficulty concentrating or making decisions? 0 0  Walking or climbing stairs? 0 1  Dressing or bathing? 0 0  Doing errands, shopping? 0 0  Preparing Food and eating ? N   Using the Toilet? N   In the past six months, have you accidently leaked urine? N   Do you have problems with loss of bowel control? N   Managing your Medications? N   Managing your Finances? N   Housekeeping or managing your Housekeeping? N     Patient Care Team: Grayce Sessions, NP as PCP - General (Internal Medicine) Lanier Prude, MD as PCP - Electrophysiology (Cardiology) Jodelle Red, MD as PCP - Cardiology (Cardiology) [facility]  Indicate any recent Medical Services you may have received from other than [facility]  providers in the past year (date may be approximate).     Assessment:   This is a routine wellness examination for [place].  Hearing/Vision screen Hearing Screening - Comments:: Patient denies any hearing difficulties.   Vision Screening - Comments:: Wears rx glasses - up to date with routine eye exams with Dr. Dione Booze   Goals Addressed             This Visit's Progress    Patient Stated       To feel better and regain my strength like I used to have or to get as close as possible to having the same amount of strength I used to have.        Depression Screen    06/04/2023    1:55 PM 11/15/2022    4:24 PM 09/22/2022   10:28 AM 07/05/2022   10:43 AM 05/11/2022    5:04 PM 03/31/2022   11:28 AM 12/29/2021    9:34 AM  PHQ 2/9 Scores  PHQ - 2 Score 2 2 2 2  0 1   PHQ- 9 Score 12 9 9 6      Exception Documentation       Patient refusal    Fall Risk    06/04/2023    2:03 PM 11/15/2022    4:24 PM 07/05/2022   10:43 AM 03/31/2022   11:32 AM 12/29/2021    9:34 AM  Fall Risk   Falls in the past year? 0 0 1 1 0  Number falls in past yr:  0 0 1 1   Injury with Fall? 0 0 0 1   Risk for fall due to : No Fall Risks;Impaired mobility;Impaired balance/gait No Fall Risks  History of fall(s)   Follow up Falls prevention discussed;Education provided   Falls evaluation completed     MEDICARE RISK AT HOME: Medicare Risk at Home Any stairs in or around the home?: No If so, are there any without handrails?: No Home free of loose throw rugs in walkways, pet beds, electrical cords, etc?: Yes Life alert?: No Use of a cane, walker or w/c?: Yes Grab bars in the bathroom?: Yes Shower chair or bench in shower?: No Elevated toilet seat or a handicapped toilet?: No  TIMED UP AND GO:  Was the test performed?  No    Cognitive Function:        06/04/2023    1:52 PM 10/28/2021   12:47 PM 04/26/2017   11:50 AM  6CIT Screen  What Year? 0 points 4 points 0 points  What month? 0 points 0 points 0  points  What time? 0 points 3 points 0 points  Count back from 20 0 points 0 points 0 points  Months in reverse 0 points 4 points 2 points  Repeat phrase 0 points 0 points 8 points  Total Score 0 points 11 points 10 points    Immunizations Immunization History  Administered Date(s) Administered   Influenza,inj,Quad PF,6+ Mos 05/14/2019, 06/24/2020, 05/04/2021   PFIZER(Purple Top)SARS-COV-2 Vaccination 11/13/2019, 12/04/2019   Pneumococcal Polysaccharide-23 02/27/2012   Tdap 03/04/2019    TDAP status: Up to date  Flu Vaccine status: Due, Education has been provided regarding the importance of this vaccine. Advised may receive this vaccine at local pharmacy or Health Dept. Aware to provide a copy of the vaccination record if obtained from local pharmacy or Health Dept. Verbalized acceptance and understanding.  Pneumococcal vaccine status: Due, Education has been provided regarding the importance of this vaccine. Advised may receive this vaccine at local pharmacy or Health Dept. Aware to provide a copy of the vaccination record if obtained from local pharmacy or Health Dept. Verbalized acceptance and understanding.  Covid-19 vaccine status: Information provided on how to obtain vaccines.   Qualifies for Shingles Vaccine? Yes   Zostavax completed No   Shingrix Completed?: No.    Education has been provided regarding the importance of this vaccine. Patient has been advised to call insurance company to determine out of pocket expense if they have not yet received this vaccine. Advised may also receive vaccine at local pharmacy or Health Dept. Verbalized acceptance and understanding.  Screening Tests Health Maintenance  Topic Date Due   OPHTHALMOLOGY EXAM  Never done   Zoster Vaccines- Shingrix (1 of 2) Never done   Pneumonia Vaccine 39+ Years old (2 of 2 - PCV) 02/26/2013   COVID-19 Vaccine (3 - Pfizer risk series) 01/01/2020   DEXA SCAN  Never done   INFLUENZA VACCINE  03/22/2023    Medicare Annual Wellness (AWV)  04/01/2023   FOOT EXAM  06/08/2023   HEMOGLOBIN A1C  09/15/2023   MAMMOGRAM  12/24/2023   Colonoscopy  03/04/2025   DTaP/Tdap/Td (2 - Td or Tdap) 03/03/2029   Hepatitis C Screening  Completed   HPV VACCINES  Aged Out    Health Maintenance  Health Maintenance Due  Topic Date Due   OPHTHALMOLOGY EXAM  Never done   Zoster Vaccines- Shingrix (1 of 2) Never done   Pneumonia Vaccine 83+ Years old (2 of 2 -  PCV) 02/26/2013   COVID-19 Vaccine (3 - Pfizer risk series) 01/01/2020   DEXA SCAN  Never done   INFLUENZA VACCINE  03/22/2023   Medicare Annual Wellness (AWV)  04/01/2023    Colorectal cancer screening: Type of screening: Colonoscopy. Completed 03/05/2015. Repeat every 10 years  Mammogram status: Ordered 06/04/2023. Pt provided with contact info and advised to call to schedule appt.   Bone Density status: Ordered 06/04/2023. Pt provided with contact info and advised to call to schedule appt.  Lung Cancer Screening: (Low Dose CT Chest recommended if Age 67-80 years, 20 pack-year currently smoking OR have quit w/in 15years.) does not qualify.   Lung Cancer Screening Referral: na  Additional Screening:  Hepatitis C Screening: does not qualify; Completed 02/11/2022  Vision Screening: Recommended annual ophthalmology exams for early detection of glaucoma and other disorders of the eye. Is the patient up to date with their annual eye exam?  Yes  Who is the provider or what is the name of the office in which the patient attends annual eye exams? Dr. Dione Booze If pt is not established with a provider, would they like to be referred to a provider to establish care? No .   Dental Screening: Recommended annual dental exams for proper oral hygiene  Diabetic Foot Exam:Completed on 03/21/2023  Community Resource Referral / Chronic Care Management: CRR required this visit?  No   CCM required this visit?  No     Plan:     I have personally reviewed and  noted the following in the patient's chart:   Medical and social history Use of alcohol, tobacco or illicit drugs  Current medications and supplements including opioid prescriptions. Patient is not currently taking opioid prescriptions. Functional ability and status Nutritional status Physical activity Advanced directives List of other physicians Hospitalizations, surgeries, and ER visits in previous 12 months Vitals Screenings to include cognitive, depression, and falls Referrals and appointments  In addition, I have reviewed and discussed with patient certain preventive protocols, quality metrics, and best practice recommendations. A written personalized care plan for preventive services as well as general preventive health recommendations were provided to patient.     [name], CMA   06/04/2023   After Visit Summary: (Mail) Due to this being a telephonic visit, the after visit summary with patients personalized plan was offered to patient via mail

## 2023-06-18 ENCOUNTER — Other Ambulatory Visit: Payer: Self-pay

## 2023-06-18 ENCOUNTER — Encounter (HOSPITAL_COMMUNITY): Payer: Self-pay

## 2023-06-18 ENCOUNTER — Emergency Department (HOSPITAL_COMMUNITY)
Admission: EM | Admit: 2023-06-18 | Discharge: 2023-06-19 | Disposition: A | Discharge status: Home / Self Care | Payer: 59 | Attending: Emergency Medicine | Admitting: Emergency Medicine

## 2023-06-18 ENCOUNTER — Emergency Department (HOSPITAL_COMMUNITY): Payer: 59

## 2023-06-18 DIAGNOSIS — S0990XA Unspecified injury of head, initial encounter: Secondary | ICD-10-CM | POA: Diagnosis not present

## 2023-06-18 DIAGNOSIS — R109 Unspecified abdominal pain: Secondary | ICD-10-CM | POA: Insufficient documentation

## 2023-06-18 DIAGNOSIS — Z7901 Long term (current) use of anticoagulants: Secondary | ICD-10-CM | POA: Diagnosis not present

## 2023-06-18 DIAGNOSIS — M546 Pain in thoracic spine: Secondary | ICD-10-CM | POA: Insufficient documentation

## 2023-06-18 DIAGNOSIS — S22089A Unspecified fracture of T11-T12 vertebra, initial encounter for closed fracture: Secondary | ICD-10-CM | POA: Insufficient documentation

## 2023-06-18 DIAGNOSIS — R0789 Other chest pain: Secondary | ICD-10-CM | POA: Insufficient documentation

## 2023-06-18 DIAGNOSIS — S22088A Other fracture of T11-T12 vertebra, initial encounter for closed fracture: Secondary | ICD-10-CM

## 2023-06-18 DIAGNOSIS — S299XXA Unspecified injury of thorax, initial encounter: Secondary | ICD-10-CM | POA: Diagnosis present

## 2023-06-18 DIAGNOSIS — W182XXA Fall in (into) shower or empty bathtub, initial encounter: Secondary | ICD-10-CM | POA: Diagnosis not present

## 2023-06-18 DIAGNOSIS — S22080A Wedge compression fracture of T11-T12 vertebra, initial encounter for closed fracture: Secondary | ICD-10-CM | POA: Insufficient documentation

## 2023-06-18 DIAGNOSIS — W19XXXA Unspecified fall, initial encounter: Secondary | ICD-10-CM

## 2023-06-18 LAB — COMPREHENSIVE METABOLIC PANEL
ALT: 13 U/L (ref 0–44)
AST: 16 U/L (ref 15–41)
Albumin: 3.6 g/dL (ref 3.5–5.0)
Alkaline Phosphatase: 80 U/L (ref 38–126)
Anion gap: 13 (ref 5–15)
BUN: 51 mg/dL — ABNORMAL HIGH (ref 8–23)
CO2: 29 mmol/L (ref 22–32)
Calcium: 9.8 mg/dL (ref 8.9–10.3)
Chloride: 95 mmol/L — ABNORMAL LOW (ref 98–111)
Creatinine, Ser: 9.32 mg/dL — ABNORMAL HIGH (ref 0.44–1.00)
GFR, Estimated: 4 mL/min — ABNORMAL LOW (ref >60)
Glucose, Bld: 112 mg/dL — ABNORMAL HIGH (ref 70–99)
Potassium: 4.7 mmol/L (ref 3.5–5.1)
Sodium: 137 mmol/L (ref 135–145)
Total Bilirubin: 0.7 mg/dL (ref 0.3–1.2)
Total Protein: 7.8 g/dL (ref 6.5–8.1)

## 2023-06-18 LAB — CBC
HCT: 32.4 % — ABNORMAL LOW (ref 36.0–46.0)
Hemoglobin: 10.3 g/dL — ABNORMAL LOW (ref 12.0–15.0)
MCH: 30.7 pg (ref 26.0–34.0)
MCHC: 31.8 g/dL (ref 30.0–36.0)
MCV: 96.4 fL (ref 80.0–100.0)
Platelets: 157 10*3/uL (ref 150–400)
RBC: 3.36 MIL/uL — ABNORMAL LOW (ref 3.87–5.11)
RDW: 14.7 % (ref 11.5–15.5)
WBC: 7.1 10*3/uL (ref 4.0–10.5)
nRBC: 0 % (ref 0.0–0.2)

## 2023-06-18 LAB — I-STAT CG4 LACTIC ACID, ED: Lactic Acid, Venous: 1.1 mmol/L (ref 0.5–1.9)

## 2023-06-18 LAB — SAMPLE TO BLOOD BANK

## 2023-06-18 LAB — PROTIME-INR
INR: 1.9 — ABNORMAL HIGH (ref 0.8–1.2)
Prothrombin Time: 21.9 s — ABNORMAL HIGH (ref 11.4–15.2)

## 2023-06-18 LAB — I-STAT CHEM 8, ED
BUN: 51 mg/dL — ABNORMAL HIGH (ref 8–23)
Calcium, Ion: 1.07 mmol/L — ABNORMAL LOW (ref 1.15–1.40)
Chloride: 98 mmol/L (ref 98–111)
Creatinine, Ser: 10.6 mg/dL — ABNORMAL HIGH (ref 0.44–1.00)
Glucose, Bld: 108 mg/dL — ABNORMAL HIGH (ref 70–99)
HCT: 34 % — ABNORMAL LOW (ref 36.0–46.0)
Hemoglobin: 11.6 g/dL — ABNORMAL LOW (ref 12.0–15.0)
Potassium: 4.7 mmol/L (ref 3.5–5.1)
Sodium: 136 mmol/L (ref 135–145)
TCO2: 29 mmol/L (ref 22–32)

## 2023-06-18 MED ORDER — FENTANYL CITRATE PF 50 MCG/ML IJ SOSY
50.0000 ug | PREFILLED_SYRINGE | Freq: Once | INTRAMUSCULAR | Status: AC
  Administered 2023-06-18: 50 ug via INTRAVENOUS
  Filled 2023-06-18: qty 1

## 2023-06-18 MED ORDER — METOCLOPRAMIDE HCL 5 MG/ML IJ SOLN
10.0000 mg | Freq: Once | INTRAMUSCULAR | Status: AC
  Administered 2023-06-18: 10 mg via INTRAVENOUS
  Filled 2023-06-18: qty 2

## 2023-06-18 MED ORDER — OXYCODONE-ACETAMINOPHEN 5-325 MG PO TABS: 1.0000 | ORAL_TABLET | Freq: Four times a day (QID) | ORAL | 0 refills | Status: DC | PRN

## 2023-06-18 NOTE — ED Notes (Signed)
Patient is a dialysis patient.  Scheduled Tuesday/Thursday/Saturday.  Reports she make very minimal urine output

## 2023-06-18 NOTE — ED Provider Triage Note (Signed)
Emergency Medicine Provider Triage Evaluation Note  Rokisha Obrion , a 67 y.o. female  was evaluated in triage.  Pt complains of fall. Fell in the bath tub prior to arrival. On eliquis. Did not hit head or lose consciousness. Has pain to low back and left flank. Pain with inspiration as well.  Review of Systems  Positive: As above Negative: As above  Physical Exam  BP 97/69 (BP Location: Right Arm)   Pulse 79   Temp 98.3 F (36.8 C)   Resp 18   SpO2 100%  Gen:   Awake, no distress   Resp:  Normal effort  MSK:   Moves extremities without difficulty  Other:    Medical Decision Making  Medically screening exam initiated at 7:30 PM.  Appropriate orders placed.  Maidah Shorb was informed that the remainder of the evaluation will be completed by another provider, this initial triage assessment does not replace that evaluation, and the importance of remaining in the ED until their evaluation is complete.  Workup initiated   Mora Bellman 06/18/23 1932

## 2023-06-18 NOTE — ED Triage Notes (Signed)
Pt to ED via [facility] from home. Pt had fall in bat tub today. Pt states she was trying to get out of tub and slipped and fell. Pt hit her lower left back and side on tub. No LOC. Pt is on eliquis. Pt denies hitting her head. Pt also c/o chest wall pain post fall.    EMS: 122/59 116 CBG 90 HR 97% RA

## 2023-06-18 NOTE — Discharge Instructions (Addendum)
You were seen in the ER today for a fall. Your CT imaging shows that there is a small fracture of the T11 vertebrae which is causing a significant amount of your pain. Please follow up with [facility] for further evaluation. I have sent a prescription for pain medication to your pharmacy as needed. Please return to the ER if symptoms worsen.

## 2023-06-18 NOTE — ED Notes (Signed)
Ortho tech made aware of need for TLSO brace

## 2023-06-18 NOTE — ED Notes (Signed)
Patient currently in CT °

## 2023-06-19 NOTE — ED Provider Notes (Signed)
[place] EMERGENCY DEPARTMENT AT [facility] Provider Note   CSN: [id number] Arrival date & time: 06/18/23  4098     History Chief Complaint  Patient presents with   [name] is a 67 y.o. female.  Patient with significant past medical history presents the emergency department today with concerns of a fall.  She reports that she tried to get out of her shower when she fell backwards and struck the middle part of her back against the shower tub.  Endorsing pain in this area.  Denies any radiating pain into the legs, weakness or numbness.  Denies loss of bowel or bladder control.  Also physicians having some chest wall pain since this fall but denies any shortness of breath.  Patient is on Eliquis but does not believe that she hit her head against anything.   Fall       Home Medications Prior to Admission medications   Medication Sig Start Date End Date Taking? Authorizing Provider  oxyCODONE-acetaminophen (PERCOCET/ROXICET) 5-325 MG tablet Take 1 tablet by mouth every 6 (six) hours as needed for severe pain (pain score 7-10). 06/18/23  Yes Smitty Knudsen, PA-C  acetaminophen (TYLENOL) 325 MG tablet Take 2 tablets (650 mg total) by mouth every 6 (six) hours as needed for mild pain or moderate pain. 12/07/21   Arrien, York Ram, MD  albuterol (PROVENTIL HFA) 108 (90 Base) MCG/ACT inhaler Inhale 1-2 puffs into the lungs every 6 (six) hours as needed for wheezing or shortness of breath. 10/13/22   Grayce Sessions, NP  amiodarone (PACERONE) 200 MG tablet TAKE 1 TABLET BY MOUTH EVERY DAY 05/21/23   Bensimhon, Bevelyn Buckles, MD  apixaban (ELIQUIS) 5 MG TABS tablet Take 1 tablet (5 mg total) by mouth 2 (two) times daily. 02/21/23   Bensimhon, Bevelyn Buckles, MD  Blood Glucose Monitoring Suppl (ACCU-CHEK GUIDE) w/Device KIT Use to check blood sugar TID. 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 06/28/22   Hoy Register, MD  busPIRone (BUSPAR) 5 MG tablet TAKE ONE TABLET BY MOUTH TWICE DAILY @ 9AM & 5PM 09/06/22   Grayce Sessions, NP  colchicine 0.6 MG tablet Take 1/2 (one-half) tablet by mouth once daily 11/23/22   Hoy Register, MD  Dulaglutide (TRULICITY) 1.5 MG/0.5ML SOPN Inject 1.5 mg into the skin once a week. 07/05/22   Grayce Sessions, NP  DULoxetine (CYMBALTA) 60 MG capsule TAKE ONE CAPSULE (60MG  TOTAL) BY MOUTH DAILY AT 9AM 02/12/23   Grayce Sessions, NP  EPINEPHrine 0.3 mg/0.3 mL IJ SOAJ injection Inject 0.3 mg into the muscle as needed for anaphylaxis. 06/25/20   Grayce Sessions, NP  glucose blood (ACCU-CHEK GUIDE) test strip USE TO CHECK BLOOD SUGAR 3 TIMES DAILY 04/03/22   Hoy Register, MD  insulin lispro (HUMALOG KWIKPEN) 100 UNIT/ML KwikPen Inject 10 Units into the skin 3 (three) times daily. Patient taking differently: Inject 0-5 Units into the skin 3 (three) times daily. Sliding scale 07/05/22   Grayce Sessions, NP  lidocaine-prilocaine (EMLA) cream Apply 1 Application topically Every Tuesday,Thursday,and Saturday with dialysis. 06/01/22   [provider]  loratadine (CLARITIN) 10 MG tablet TAKE ONE TABLET BY MOUTH DAILY AT 9AM 09/06/22   Grayce Sessions, NP  meclizine (ANTIVERT) 25 MG tablet Take 1 tablet (25 mg total) by mouth 3 (three) times daily as needed for dizziness. 03/18/23   Jonah Blue,  MD  metoprolol tartrate (LOPRESSOR) 25 MG tablet Take 0.5 tablets (12.5 mg total) by mouth 2 (two) times daily. 03/30/23 06/04/23  Noralee Stain, DO  midodrine (PROAMATINE) 10 MG tablet Take 1 tablet (10 mg total) by mouth See admin instructions. Take 10mg  (1 tablet) by mouth on Tuesday's, Thursday's, and Saturday's. 03/30/23   Noralee Stain, DO  Multiple Vitamins-Minerals (MULTIVITAMIN WOMEN PO) Take 1 tablet by mouth daily.    [provider]  nitroGLYCERIN (NITROSTAT) 0.4 MG SL tablet Place 1 tablet (0.4 mg total) under the  tongue every 5 (five) minutes x 3 doses as needed for chest pain. 01/08/14   Rinaldo Cloud, MD  ondansetron (ZOFRAN) 4 MG tablet Take 1 tablet (4 mg total) by mouth every 6 (six) hours. Patient taking differently: Take 4 mg by mouth every 6 (six) hours as needed for nausea or vomiting. 02/24/23   Wynetta Fines, MD  pantoprazole (PROTONIX) 40 MG tablet Take 1 tablet (40 mg total) by mouth daily. 04/24/23   Grayce Sessions, NP  predniSONE (STERAPRED UNI-PAK 21 TAB) 10 MG (21) TBPK tablet Prednisone dose pack directions Day 1 take 6 tablets Day 2 take 5 tablets Day 3 take 4 tablets Day 4 take 3 tablets Day 3 take 2 tablets Day 4 take 1 tablet Dispense 21 tablets 05/03/23   Grayce Sessions, NP  rosuvastatin (CRESTOR) 10 MG tablet TAKE ONE TABLET BY MOUTH DAILY AT 5PM 04/13/23   Bensimhon, Bevelyn Buckles, MD      Allergies    Bee pollen, Bee venom, Sulfa antibiotics, Chlorhexidine, and Iodinated contrast media    Review of Systems   Review of Systems  Musculoskeletal:  Positive for back pain.  All other systems reviewed and are negative.   Physical Exam Updated Vital Signs BP 96/67 (BP Location: Right Arm)   Pulse 89   Temp 98.4 F (36.9 C) (Oral)   Resp 16   SpO2 95%  Physical Exam Vitals and nursing note reviewed.  Constitutional:      General: She is not in acute distress.    Appearance: She is well-developed.  HENT:     Head: Normocephalic and atraumatic.  Eyes:     Conjunctiva/sclera: Conjunctivae normal.  Cardiovascular:     Rate and Rhythm: Normal rate and regular rhythm.     Heart sounds: No murmur heard. Pulmonary:     Effort: Pulmonary effort is normal. No respiratory distress.     Breath sounds: Normal breath sounds.  Abdominal:     Palpations: Abdomen is soft.     Tenderness: There is no abdominal tenderness.  Musculoskeletal:        General: Tenderness and signs of injury present. No swelling or deformity. Normal range of motion.       Arms:     Cervical back:  Neck supple.     Comments: Midline spinal tenderness in the thoracic/lumbar region.  No obvious deformity.  Skin:    General: Skin is warm and dry.     Capillary Refill: Capillary refill takes less than 2 seconds.  Neurological:     Mental Status: She is alert.  Psychiatric:        Mood and Affect: Mood normal.     ED Results / Procedures / Treatments   Labs (all labs ordered are listed, but only abnormal results are displayed) Labs Reviewed  COMPREHENSIVE METABOLIC PANEL - Abnormal; Notable for the following components:      Result Value   Chloride 95 (*)  Glucose, Bld 112 (*)    BUN 51 (*)    Creatinine, Ser 9.32 (*)    GFR, Estimated 4 (*)    All other components within normal limits  CBC - Abnormal; Notable for the following components:   RBC 3.36 (*)    Hemoglobin 10.3 (*)    HCT 32.4 (*)    All other components within normal limits  PROTIME-INR - Abnormal; Notable for the following components:   Prothrombin Time 21.9 (*)    INR 1.9 (*)    All other components within normal limits  I-STAT CHEM 8, ED - Abnormal; Notable for the following components:   BUN 51 (*)    Creatinine, Ser 10.60 (*)    Glucose, Bld 108 (*)    Calcium, Ion 1.07 (*)    Hemoglobin 11.6 (*)    HCT 34.0 (*)    All other components within normal limits  I-STAT CG4 LACTIC ACID, ED  SAMPLE TO BLOOD BANK    EKG None  Radiology CT CHEST ABDOMEN PELVIS WO CONTRAST  Result Date: 06/18/2023 CLINICAL DATA:  Polytrauma, blunt.  Fall EXAM: CT CHEST, ABDOMEN AND PELVIS WITHOUT CONTRAST TECHNIQUE: Multidetector CT imaging of the chest, abdomen and pelvis was performed following the standard protocol without IV contrast. RADIATION DOSE REDUCTION: This exam was performed according to the departmental dose-optimization program which includes automated exposure control, adjustment of the mA and/or kV according to patient size and/or use of iterative reconstruction technique. COMPARISON:  CT abdomen pelvis  02/24/2023 FINDINGS: CHEST: Cardiovascular: The thoracic aorta is normal in caliber. The heart is enlarged in size. Left atrial appendage clip. Tricuspid and mitral valve replacement. No significant pericardial effusion. The main pulmonary artery measures at the upper limits of normal. Lungs/Pleura: No focal consolidation. No pulmonary nodule. No pulmonary mass. No pulmonary contusion or laceration. No pneumatocele formation. No pleural effusion. No pneumothorax. No hemothorax. Mediastinum/Nodes: No pneumomediastinum. The central airways are patent. The esophagus is unremarkable. The thyroid is unremarkable. Limited evaluation for hilar lymphadenopathy on this noncontrast study. No mediastinal or axillary lymphadenopathy. Musculoskeletal/Chest wall No chest wall mass. Sternotomy wires are intact. No acute rib or sternal fracture. Please see separately dictated CT thoracolumbar spine 06/18/2023. ABDOMEN / PELVIS: Hepatobiliary: Not enlarged. Question slight nodularity of the hepatic contour. No focal lesion. Status post cholecystectomy. No biliary ductal dilatation. Pancreas: Normal pancreatic contour. No main pancreatic duct dilatation. Spleen: Not enlarged. No focal lesion. Adrenals/Urinary Tract: No nodularity bilaterally. No hydroureteronephrosis. No nephroureterolithiasis. No contour deforming renal mass. The urinary bladder is unremarkable. Stomach/Bowel: No small or large bowel wall thickening or dilatation. The appendix is unremarkable. Vasculature/Lymphatic: Mild atherosclerotic plaque no abdominal aorta or iliac aneurysm. No abdominal, pelvic, inguinal lymphadenopathy. Reproductive: Status post hysterectomy. Bilateral adnexal regions are unremarkable. Other: No simple free fluid ascites. No pneumoperitoneum. No mesenteric hematoma identified. No organized fluid collection. Musculoskeletal: No significant soft tissue hematoma. No acute pelvic fracture. Please see separately dictated CT thoracolumbar spine  06/18/2023. Ports and Devices: None. IMPRESSION: 1. No acute intrathoracic, intra-abdominal, intrapelvic traumatic injury with limited evaluation on this noncontrast study. 2. Please see separately dictated CT thoracolumbar spine 06/18/2023. 3. Other imaging findings of potential clinical significance: Cardiomegaly. Question cirrhosis. Aortic Atherosclerosis (ICD10-I70.0). Electronically Signed   By: Tish Frederickson M.D.   On: 06/18/2023 21:47   CT T-SPINE NO CHARGE  Addendum Date: 06/18/2023   ADDENDUM REPORT: 06/18/2023 21:41 ADDENDUM: Please note findings and impression should mention superior endplate T11 fracture not T12. Electronically Signed  By: Tish Frederickson M.D.   On: 06/18/2023 21:41   Result Date: 06/18/2023 CLINICAL DATA:  Fall. EXAM: CT THORACIC AND LUMBAR SPINE WITHOUT CONTRAST TECHNIQUE: Multidetector CT imaging of the thoracic and lumbar spine was performed without contrast. Multiplanar CT image reconstructions were also generated. RADIATION DOSE REDUCTION: This exam was performed according to the departmental dose-optimization program which includes automated exposure control, adjustment of the mA and/or kV according to patient size and/or use of iterative reconstruction technique. COMPARISON:  None Available. FINDINGS: CT THORACIC SPINE FINDINGS Alignment: Normal. Vertebrae: Multilevel mild degenerative changes of the spine. Age indeterminate mild concavity of the superior endplate of the T12 level. No definite acute distal acute fracture or focal pathologic process. Paraspinal and other soft tissues: Negative. Disc levels: Maintained. CT LUMBAR SPINE FINDINGS Segmentation: 5 lumbar type vertebrae. Alignment: Normal. Vertebrae: No acute fracture or focal pathologic process. Paraspinal and other soft tissues: Negative. Disc levels: Maintained. IMPRESSION: CT THORACIC SPINE IMPRESSION 1. Age-indeterminate mild superior endplate T12 compression fracture. Correlate with tenderness to  palpation to evaluate for an acute component. 2. Otherwise no acute displaced fracture or traumatic listhesis of the thoracic spine. CT LUMBAR SPINE IMPRESSION 1. No acute displaced fracture or traumatic listhesis of the lumbar spine. Electronically Signed: By: Tish Frederickson M.D. On: 06/18/2023 21:37   CT L-SPINE NO CHARGE  Addendum Date: 06/18/2023   ADDENDUM REPORT: 06/18/2023 21:41 ADDENDUM: Please note findings and impression should mention superior endplate T11 fracture not T12. Electronically Signed   By: Tish Frederickson M.D.   On: 06/18/2023 21:41   Result Date: 06/18/2023 CLINICAL DATA:  Fall. EXAM: CT THORACIC AND LUMBAR SPINE WITHOUT CONTRAST TECHNIQUE: Multidetector CT imaging of the thoracic and lumbar spine was performed without contrast. Multiplanar CT image reconstructions were also generated. RADIATION DOSE REDUCTION: This exam was performed according to the departmental dose-optimization program which includes automated exposure control, adjustment of the mA and/or kV according to patient size and/or use of iterative reconstruction technique. COMPARISON:  None Available. FINDINGS: CT THORACIC SPINE FINDINGS Alignment: Normal. Vertebrae: Multilevel mild degenerative changes of the spine. Age indeterminate mild concavity of the superior endplate of the T12 level. No definite acute distal acute fracture or focal pathologic process. Paraspinal and other soft tissues: Negative. Disc levels: Maintained. CT LUMBAR SPINE FINDINGS Segmentation: 5 lumbar type vertebrae. Alignment: Normal. Vertebrae: No acute fracture or focal pathologic process. Paraspinal and other soft tissues: Negative. Disc levels: Maintained. IMPRESSION: CT THORACIC SPINE IMPRESSION 1. Age-indeterminate mild superior endplate T12 compression fracture. Correlate with tenderness to palpation to evaluate for an acute component. 2. Otherwise no acute displaced fracture or traumatic listhesis of the thoracic spine. CT LUMBAR SPINE  IMPRESSION 1. No acute displaced fracture or traumatic listhesis of the lumbar spine. Electronically Signed: By: Tish Frederickson M.D. On: 06/18/2023 21:37   DG Pelvis Portable  Result Date: 06/18/2023 CLINICAL DATA:  Trauma EXAM: PORTABLE PELVIS 1-2 VIEWS COMPARISON:  CT abdomen pelvis 06/18/2023 FINDINGS: There is no evidence of pelvic fracture or diastasis. No acute displaced fracture or dislocation of either hips. No pelvic bone lesions are seen. IMPRESSION: Negative. Electronically Signed   By: Tish Frederickson M.D.   On: 06/18/2023 21:27   DG Chest Port 1 View  Result Date: 06/18/2023 CLINICAL DATA:  Trauma EXAM: PORTABLE CHEST 1 VIEW COMPARISON:  Chest x-ray 01/11/2023, CT chest 06/17/2022 FINDINGS: Stable enlarged cardiac silhouette. The heart and mediastinal contours unchanged. Atrial appendage clip. Mitral valve replacement. Tricuspid valve replacement. Aortic calcification. No focal consolidation.  No pulmonary edema. No pleural effusion. No pneumothorax. No acute osseous abnormality. IMPRESSION: 1. No acute cardiopulmonary abnormality. 2. Aortic Atherosclerosis (ICD10-I70.0). Electronically Signed   By: Tish Frederickson M.D.   On: 06/18/2023 21:20   CT HEAD WO CONTRAST  Result Date: 06/18/2023 CLINICAL DATA:  Head trauma, moderate-severe; Polytrauma, blunt Fall. EXAM: CT HEAD WITHOUT CONTRAST CT CERVICAL SPINE WITHOUT CONTRAST TECHNIQUE: Multidetector CT imaging of the head and cervical spine was performed following the standard protocol without intravenous contrast. Multiplanar CT image reconstructions of the cervical spine were also generated. RADIATION DOSE REDUCTION: This exam was performed according to the departmental dose-optimization program which includes automated exposure control, adjustment of the mA and/or kV according to patient size and/or use of iterative reconstruction technique. COMPARISON:  None Available. FINDINGS: CT HEAD FINDINGS Brain: No evidence of large-territorial  acute infarction. No parenchymal hemorrhage. No mass lesion. No extra-axial collection. No mass effect or midline shift. No hydrocephalus. Basilar cisterns are patent. Vascular: No hyperdense vessel. Skull: No acute fracture or focal lesion. Sinuses/Orbits: Paranasal sinuses and mastoid air cells are clear. Bilateral lens replacement. The orbits are unremarkable. Other: None. CT CERVICAL SPINE FINDINGS Alignment: Normal. Skull base and vertebrae: No acute fracture. No aggressive appearing focal osseous lesion or focal pathologic process. Soft tissues and spinal canal: No prevertebral fluid or swelling. No visible canal hematoma. Upper chest: Unremarkable. Other: Atherosclerotic plaque of the aortic arch and its branches. Retropharyngeal course of the carotid arteries within the neck. IMPRESSION: 1. No acute intracranial abnormality. 2. No acute displaced fracture or traumatic listhesis of the cervical spine. Electronically Signed   By: Tish Frederickson M.D.   On: 06/18/2023 21:17   CT CERVICAL SPINE WO CONTRAST  Result Date: 06/18/2023 CLINICAL DATA:  Head trauma, moderate-severe; Polytrauma, blunt Fall. EXAM: CT HEAD WITHOUT CONTRAST CT CERVICAL SPINE WITHOUT CONTRAST TECHNIQUE: Multidetector CT imaging of the head and cervical spine was performed following the standard protocol without intravenous contrast. Multiplanar CT image reconstructions of the cervical spine were also generated. RADIATION DOSE REDUCTION: This exam was performed according to the departmental dose-optimization program which includes automated exposure control, adjustment of the mA and/or kV according to patient size and/or use of iterative reconstruction technique. COMPARISON:  None Available. FINDINGS: CT HEAD FINDINGS Brain: No evidence of large-territorial acute infarction. No parenchymal hemorrhage. No mass lesion. No extra-axial collection. No mass effect or midline shift. No hydrocephalus. Basilar cisterns are patent. Vascular: No  hyperdense vessel. Skull: No acute fracture or focal lesion. Sinuses/Orbits: Paranasal sinuses and mastoid air cells are clear. Bilateral lens replacement. The orbits are unremarkable. Other: None. CT CERVICAL SPINE FINDINGS Alignment: Normal. Skull base and vertebrae: No acute fracture. No aggressive appearing focal osseous lesion or focal pathologic process. Soft tissues and spinal canal: No prevertebral fluid or swelling. No visible canal hematoma. Upper chest: Unremarkable. Other: Atherosclerotic plaque of the aortic arch and its branches. Retropharyngeal course of the carotid arteries within the neck. IMPRESSION: 1. No acute intracranial abnormality. 2. No acute displaced fracture or traumatic listhesis of the cervical spine. Electronically Signed   By: Tish Frederickson M.D.   On: 06/18/2023 21:17    Procedures Procedures   Medications Ordered in ED Medications  fentaNYL (SUBLIMAZE) injection 50 mcg (50 mcg Intravenous Given 06/18/23 2118)  metoCLOPramide (REGLAN) injection 10 mg (10 mg Intravenous Given 06/18/23 2348)  fentaNYL (SUBLIMAZE) injection 50 mcg (50 mcg Intravenous Given 06/18/23 2346)    ED Course/ Medical Decision Making/ A&P  Medical Decision Making Risk Prescription drug management.   This patient presents to the ED for concern of fall.  Differential diagnosis includes lumbar fracture, thoracic spine fracture, pneumothorax, mechanical fall, SAH   Lab Tests:  I Ordered, and personally interpreted labs.  The pertinent results include: CBC with baseline anemia noted, CMP shows significant renal impairment which is patient's baseline, lactic acid unremarkable   Imaging Studies ordered:  I ordered imaging studies including x-ray of the chest, pelvis, CT scans of the head, cervical spine, chest abdomen pelvis, and T-spine and L-spine I independently visualized and interpreted imaging which showed no acute cardiopulmonary process, no evidence  of any pelvic fracture or dislocation, CT imaging is all unremarkable with exception of a T11 fracture noted I agree with the radiologist interpretation   Medicines ordered and prescription drug management:  I ordered medication including fentanyl, Reglan for pain, nausea Reevaluation of the patient after these medicines showed that the patient improved I have reviewed the patients home medicines and have made adjustments as needed   Problem List / ED Course:  Patient presents to the emergency department following a mechanical fall.  She reportedly fell backwards when she tried to get out of her tub this evening.  She states that she landed on the mid back area and is having pain in this location.  Denies pain radiating towards any area and denies any bowel or bladder incontinence.  Patient is on Eliquis and she is concerned about this however she denies any head strike.  Currently denies any headache, dizziness, nausea.  Given mechanism and amount of pain patient is in, we will plan on CT imaging of chest abdomen pelvis with focus on the lumbar and thoracic spines.  CT head and CT cervical spine also done as precaution as patient is unclear if she hit her head or not.  Chest x-ray and pelvis x-ray ordered for initial assessment.  50 mcg of fentanyl ordered for pain control. All labs are largely unremarkable with no acute findings noted as patient has noted baseline anemia and known renal impairment.  Lactic acid is unremarkable at 1.1. Patient's imaging is thankfully reassuring on x-rays.  CT scans are also largely normal however there is an acute T11 fracture noted.  This is consistent with the area pain that patient is reporting.  Will place patient into a TLSO brace for symptom control and advised patient to follow-up closely with neurosurgery in the outpatient setting.  Patient given another dose of fentanyl for pain control and Reglan for nausea control given prior history of prolonged QT. On  reassessment, patient's symptoms have improved considerably.  Patient is currently stable for discharge with outpatient follow-up.  Do not believe the patient requires inpatient admission at this time.  Patient discharged home in stable condition.   Social Determinants of Health:    Final Clinical Impression(s) / ED Diagnoses Final diagnoses:  Fall, initial encounter  Other closed fracture of eleventh thoracic vertebra, initial encounter [facility])    Rx / DC Orders ED Discharge Orders          Ordered    oxyCODONE-acetaminophen (PERCOCET/ROXICET) 5-325 MG tablet  Every 6 hours PRN        06/18/23 2357              Smitty Knudsen, PA-C 06/19/23 2351    Royanne Foots, DO 06/21/23 [phone]

## 2023-06-19 NOTE — Progress Notes (Signed)
Orthopedic Tech Progress Note Patient Details:  [name] 1956-03-17 [id number]  Ortho Devices Type of Ortho Device: Thoracolumbar corset (TLSO) Ortho Device/Splint Interventions: Ordered, Application, Adjustment  I fit the brace and taught the pt how to put it on. Post Interventions Patient Tolerated: Well Instructions Provided: Care of device  [name] 06/19/2023, 12:19 AM

## 2023-06-25 ENCOUNTER — Ambulatory Visit (INDEPENDENT_AMBULATORY_CARE_PROVIDER_SITE_OTHER): Payer: Self-pay

## 2023-06-25 NOTE — Telephone Encounter (Signed)
Will forward to provider  

## 2023-06-25 NOTE — Telephone Encounter (Signed)
Chief Complaint: Constipation  Symptoms: Nausea, vomiting, decreased level of activity, abdominal pain  Frequency: comes and goes  Pertinent Negatives: Patient denies fever, rectal bleeding, straining Disposition: [] ED /[] Urgent Care (no appt availability in office) / [x] Appointment(In office/virtual)/ []  [facility]/ [] Home Care/ [] Refused Recommended Disposition /[] [facility]/ []  Follow-up with PCP Additional Notes: Patient stated she has not had a bowel movement since 06/15/23. Patient reports she had a fall last Monday and has been taking oxycodone for the pain, patient is on fluid restriction due to being on dialysis. Patient states she usually has a BM every 3-4 days. Care advice was given and patient was scheduled for first available appointment with PCP 07/02/23. Patient stated she has not tried anything over the counter yet so she will try that first and see if that improves her symptoms. Advised patient to callback if symptoms get worse or do not improve after trying over the counter treatment. Advise she may need to be seen at urgent care sooner than scheduled appointment. Patient verbalized understanding.    Reason for Disposition  Abdomen is more swollen than usual  Answer Assessment - Initial Assessment Questions 1. STOOL PATTERN OR FREQUENCY: "How often do you have a bowel movement (BM)?"  (Normal range: 3 times a day to every 3 days)  "When was your last BM?"      06/15/23 last BM, I usually go every 3-4 days 2. STRAINING: "Do you have to strain to have a BM?"      No  3. RECTAL PAIN: "Does your rectum hurt when the stool comes out?" If Yes, ask: "Do you have hemorrhoids? How bad is the pain?"  (Scale 1-10; or mild, moderate, severe)     No  4. STOOL COMPOSITION: "Are the stools hard?"      Yes  5. BLOOD ON STOOLS: "Has there been any blood on the toilet tissue or on the surface of the BM?" If Yes, ask: "When was the last time?"     No  6. CHRONIC  CONSTIPATION: "Is this a new problem for you?"  If No, ask: "How long have you had this problem?" (days, weeks, months)      About 2 weeks now. I have been pasting gas 7. CHANGES IN DIET OR HYDRATION: "Have there been any recent changes in your diet?" "How much fluids are you drinking on a daily basis?"  "How much have you had to drink today?"     I'm on fluid restriction  8. MEDICINES: "Have you been taking any new medicines?" "Are you taking any narcotic pain medicines?" (e.g., Dilaudid, morphine, Percocet, Vicodin)     Oxycodone  9. LAXATIVES: "Have you been using any stool softeners, laxatives, or enemas?"  If Yes, ask "What, how often, and when was the last time?"     No  10. ACTIVITY:  "How much walking do you do every day?"  "Has your activity level decreased in the past week?"        Decreased level of activity  11. CAUSE: "What do you think is causing the constipation?"        I had a fall last week, I'm taking pain medicine and Im on fluid restriction  12. OTHER SYMPTOMS: "Do you have any other symptoms?" (e.g., abdomen pain, bloating, fever, vomiting)       Abdomen pain, bloating, vomiting  Protocols used: Constipation-A-AH

## 2023-06-28 ENCOUNTER — Encounter (INDEPENDENT_AMBULATORY_CARE_PROVIDER_SITE_OTHER): Payer: Self-pay | Admitting: Primary Care

## 2023-07-02 ENCOUNTER — Ambulatory Visit (INDEPENDENT_AMBULATORY_CARE_PROVIDER_SITE_OTHER): Payer: 59 | Admitting: Primary Care

## 2023-07-03 ENCOUNTER — Observation Stay (HOSPITAL_BASED_OUTPATIENT_CLINIC_OR_DEPARTMENT_OTHER)
Admission: EM | Admit: 2023-07-03 | Discharge: 2023-07-06 | Disposition: A | Discharge status: Home / Self Care | Payer: 59 | Attending: Internal Medicine | Admitting: Internal Medicine

## 2023-07-03 ENCOUNTER — Encounter (HOSPITAL_BASED_OUTPATIENT_CLINIC_OR_DEPARTMENT_OTHER): Payer: Self-pay | Admitting: Emergency Medicine

## 2023-07-03 ENCOUNTER — Emergency Department (HOSPITAL_BASED_OUTPATIENT_CLINIC_OR_DEPARTMENT_OTHER): Payer: 59 | Admitting: Radiology

## 2023-07-03 ENCOUNTER — Other Ambulatory Visit: Payer: Self-pay

## 2023-07-03 DIAGNOSIS — J449 Chronic obstructive pulmonary disease, unspecified: Secondary | ICD-10-CM | POA: Diagnosis present

## 2023-07-03 DIAGNOSIS — I5043 Acute on chronic combined systolic (congestive) and diastolic (congestive) heart failure: Secondary | ICD-10-CM | POA: Insufficient documentation

## 2023-07-03 DIAGNOSIS — F32A Depression, unspecified: Secondary | ICD-10-CM | POA: Diagnosis present

## 2023-07-03 DIAGNOSIS — I209 Angina pectoris, unspecified: Secondary | ICD-10-CM | POA: Insufficient documentation

## 2023-07-03 DIAGNOSIS — I82721 Chronic embolism and thrombosis of deep veins of right upper extremity: Secondary | ICD-10-CM | POA: Diagnosis present

## 2023-07-03 DIAGNOSIS — Z794 Long term (current) use of insulin: Secondary | ICD-10-CM | POA: Insufficient documentation

## 2023-07-03 DIAGNOSIS — E1122 Type 2 diabetes mellitus with diabetic chronic kidney disease: Secondary | ICD-10-CM | POA: Diagnosis not present

## 2023-07-03 DIAGNOSIS — R0600 Dyspnea, unspecified: Principal | ICD-10-CM | POA: Diagnosis present

## 2023-07-03 DIAGNOSIS — I5032 Chronic diastolic (congestive) heart failure: Secondary | ICD-10-CM | POA: Diagnosis present

## 2023-07-03 DIAGNOSIS — N186 End stage renal disease: Secondary | ICD-10-CM | POA: Diagnosis not present

## 2023-07-03 DIAGNOSIS — Z992 Dependence on renal dialysis: Secondary | ICD-10-CM | POA: Insufficient documentation

## 2023-07-03 DIAGNOSIS — Z8616 Personal history of COVID-19: Secondary | ICD-10-CM | POA: Insufficient documentation

## 2023-07-03 DIAGNOSIS — I132 Hypertensive heart and chronic kidney disease with heart failure and with stage 5 chronic kidney disease, or end stage renal disease: Secondary | ICD-10-CM | POA: Diagnosis not present

## 2023-07-03 DIAGNOSIS — K219 Gastro-esophageal reflux disease without esophagitis: Secondary | ICD-10-CM | POA: Diagnosis present

## 2023-07-03 DIAGNOSIS — I10 Essential (primary) hypertension: Secondary | ICD-10-CM | POA: Diagnosis present

## 2023-07-03 DIAGNOSIS — Z86718 Personal history of other venous thrombosis and embolism: Secondary | ICD-10-CM | POA: Insufficient documentation

## 2023-07-03 DIAGNOSIS — R112 Nausea with vomiting, unspecified: Secondary | ICD-10-CM | POA: Diagnosis present

## 2023-07-03 DIAGNOSIS — K3184 Gastroparesis: Secondary | ICD-10-CM | POA: Diagnosis not present

## 2023-07-03 DIAGNOSIS — R079 Chest pain, unspecified: Secondary | ICD-10-CM | POA: Diagnosis present

## 2023-07-03 DIAGNOSIS — G4733 Obstructive sleep apnea (adult) (pediatric): Secondary | ICD-10-CM

## 2023-07-03 DIAGNOSIS — R9431 Abnormal electrocardiogram [ECG] [EKG]: Secondary | ICD-10-CM | POA: Diagnosis not present

## 2023-07-03 DIAGNOSIS — E1169 Type 2 diabetes mellitus with other specified complication: Secondary | ICD-10-CM | POA: Diagnosis present

## 2023-07-03 DIAGNOSIS — Z7985 Long-term (current) use of injectable non-insulin antidiabetic drugs: Secondary | ICD-10-CM | POA: Diagnosis not present

## 2023-07-03 DIAGNOSIS — I4581 Long QT syndrome: Secondary | ICD-10-CM | POA: Diagnosis not present

## 2023-07-03 DIAGNOSIS — Z79899 Other long term (current) drug therapy: Secondary | ICD-10-CM | POA: Diagnosis not present

## 2023-07-03 DIAGNOSIS — E66813 Obesity, class 3: Secondary | ICD-10-CM | POA: Diagnosis present

## 2023-07-03 DIAGNOSIS — E785 Hyperlipidemia, unspecified: Secondary | ICD-10-CM

## 2023-07-03 DIAGNOSIS — I4821 Permanent atrial fibrillation: Secondary | ICD-10-CM | POA: Diagnosis not present

## 2023-07-03 DIAGNOSIS — F419 Anxiety disorder, unspecified: Secondary | ICD-10-CM | POA: Diagnosis present

## 2023-07-03 LAB — TROPONIN I (HIGH SENSITIVITY)
Troponin I (High Sensitivity): 6 ng/L (ref <18)
Troponin I (High Sensitivity): 7 ng/L (ref <18)

## 2023-07-03 LAB — CBC
HCT: 33.3 % — ABNORMAL LOW (ref 36.0–46.0)
Hemoglobin: 10.7 g/dL — ABNORMAL LOW (ref 12.0–15.0)
MCH: 31.1 pg (ref 26.0–34.0)
MCHC: 32.1 g/dL (ref 30.0–36.0)
MCV: 96.8 fL (ref 80.0–100.0)
Platelets: 170 10*3/uL (ref 150–400)
RBC: 3.44 MIL/uL — ABNORMAL LOW (ref 3.87–5.11)
RDW: 16.5 % — ABNORMAL HIGH (ref 11.5–15.5)
WBC: 6.1 10*3/uL (ref 4.0–10.5)
nRBC: 0 % (ref 0.0–0.2)

## 2023-07-03 LAB — BASIC METABOLIC PANEL
Anion gap: 14 (ref 5–15)
BUN: 43 mg/dL — ABNORMAL HIGH (ref 8–23)
CO2: 30 mmol/L (ref 22–32)
Calcium: 9.8 mg/dL (ref 8.9–10.3)
Chloride: 93 mmol/L — ABNORMAL LOW (ref 98–111)
Creatinine, Ser: 13.25 mg/dL — ABNORMAL HIGH (ref 0.44–1.00)
GFR, Estimated: 3 mL/min — ABNORMAL LOW (ref >60)
Glucose, Bld: 129 mg/dL — ABNORMAL HIGH (ref 70–99)
Potassium: 4.7 mmol/L (ref 3.5–5.1)
Sodium: 137 mmol/L (ref 135–145)

## 2023-07-03 LAB — GLUCOSE, CAPILLARY
Glucose-Capillary: 87 mg/dL (ref 70–99)
Glucose-Capillary: 135 mg/dL — ABNORMAL HIGH (ref 70–99)

## 2023-07-03 LAB — MRSA NEXT GEN BY PCR, NASAL: MRSA by PCR Next Gen: NOT DETECTED

## 2023-07-03 LAB — HEPATITIS B SURFACE ANTIGEN: Hepatitis B Surface Ag: NONREACTIVE

## 2023-07-03 MED ORDER — POLYETHYLENE GLYCOL 3350 17 G PO PACK: 17.0000 g | PACK | Freq: Every day | ORAL | Status: DC | PRN

## 2023-07-03 MED ORDER — APIXABAN 5 MG PO TABS
5.0000 mg | ORAL_TABLET | Freq: Two times a day (BID) | ORAL | Status: DC
  Administered 2023-07-03 – 2023-07-06 (×5): 5 mg via ORAL
  Filled 2023-07-03 (×5): qty 1

## 2023-07-03 MED ORDER — METOCLOPRAMIDE HCL 5 MG/ML IJ SOLN
10.0000 mg | Freq: Once | INTRAMUSCULAR | Status: AC
  Administered 2023-07-03: 10 mg via INTRAVENOUS
  Filled 2023-07-03: qty 2

## 2023-07-03 MED ORDER — INSULIN ASPART 100 UNIT/ML IJ SOLN
0.0000 [IU] | Freq: Three times a day (TID) | INTRAMUSCULAR | Status: DC
  Administered 2023-07-04 – 2023-07-05 (×3): 2 [IU] via SUBCUTANEOUS

## 2023-07-03 MED ORDER — SODIUM CHLORIDE 0.9% FLUSH
3.0000 mL | Freq: Two times a day (BID) | INTRAVENOUS | Status: DC
  Administered 2023-07-03 – 2023-07-06 (×5): 3 mL via INTRAVENOUS

## 2023-07-03 MED ORDER — ROSUVASTATIN CALCIUM 5 MG PO TABS
10.0000 mg | ORAL_TABLET | Freq: Every day | ORAL | Status: DC
  Administered 2023-07-03 – 2023-07-05 (×3): 10 mg via ORAL
  Filled 2023-07-03 (×3): qty 2

## 2023-07-03 MED ORDER — DULOXETINE HCL 60 MG PO CPEP
60.0000 mg | ORAL_CAPSULE | Freq: Every day | ORAL | Status: DC
  Administered 2023-07-03 – 2023-07-06 (×4): 60 mg via ORAL
  Filled 2023-07-03 (×4): qty 1

## 2023-07-03 MED ORDER — BUSPIRONE HCL 5 MG PO TABS
5.0000 mg | ORAL_TABLET | Freq: Two times a day (BID) | ORAL | Status: DC
  Administered 2023-07-03 – 2023-07-06 (×6): 5 mg via ORAL
  Filled 2023-07-03 (×6): qty 1

## 2023-07-03 MED ORDER — MIDODRINE HCL 5 MG PO TABS
10.0000 mg | ORAL_TABLET | ORAL | Status: DC
  Administered 2023-07-06: 10 mg via ORAL
  Filled 2023-07-03 (×2): qty 2

## 2023-07-03 MED ORDER — METOPROLOL TARTRATE 12.5 MG HALF TABLET
12.5000 mg | ORAL_TABLET | Freq: Two times a day (BID) | ORAL | Status: DC
  Administered 2023-07-03 – 2023-07-06 (×4): 12.5 mg via ORAL
  Filled 2023-07-03 (×4): qty 1

## 2023-07-03 MED ORDER — INSULIN ASPART 100 UNIT/ML IJ SOLN: 0.0000 [IU] | Freq: Every day | INTRAMUSCULAR | Status: DC

## 2023-07-03 MED ORDER — ACETAMINOPHEN 650 MG RE SUPP: 650.0000 mg | Freq: Four times a day (QID) | RECTAL | Status: DC | PRN

## 2023-07-03 MED ORDER — AMIODARONE HCL 200 MG PO TABS
200.0000 mg | ORAL_TABLET | Freq: Every day | ORAL | Status: DC
  Administered 2023-07-05 – 2023-07-06 (×2): 200 mg via ORAL
  Filled 2023-07-03 (×2): qty 1

## 2023-07-03 MED ORDER — ACETAMINOPHEN 325 MG PO TABS: 650.0000 mg | ORAL_TABLET | Freq: Four times a day (QID) | ORAL | Status: DC | PRN

## 2023-07-03 MED ORDER — PANTOPRAZOLE SODIUM 40 MG PO TBEC
40.0000 mg | DELAYED_RELEASE_TABLET | Freq: Every day | ORAL | Status: DC
  Administered 2023-07-03 – 2023-07-06 (×4): 40 mg via ORAL
  Filled 2023-07-03 (×4): qty 1

## 2023-07-03 NOTE — ED Notes (Signed)
Thomas with [facility] called for transport

## 2023-07-03 NOTE — ED Notes (Signed)
Report given to [facility]  

## 2023-07-03 NOTE — ED Triage Notes (Signed)
Pt from home c/o center chest pain with radiation to left side, nausea, and emesis since yesterday, pt reports cardiac hx

## 2023-07-03 NOTE — H&P (Signed)
History and Physical   [name] UUV:[id number] DOB: [birth year] DOA: 07/03/2023  PCP: Grayce Sessions, NP   Patient coming from: Home  Chief Complaint: Nausea, vomiting, chest pain  HPI: [name] is a 67 y.o. 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  Patient reports feeling unwell for about a week.  Primarily nausea and vomiting symptoms.  Symptoms appear to be triggered by eating and could be related to her known gastroparesis.  Due to feeling unwell she missed her dialysis session on Saturday and next session was due today.  In addition reports 2 days of intermittent left-sided chest pain radiating to the left shoulder.  No specific aggravating or alleviating factors.  Some mild associated shortness of breath.    Denies fevers, chills, abdominal pain, constipation, diarrhea.  ED Course: Vital signs in the ED notable for blood pressure in the 100s to 160s systolic, heart rate in the 80s to 100s.  Lab workup included BMP with chloride 93, BUN 42, creatinine 13.25, glucose 129.  CBC with hemoglobin stable at 10.7.  Troponin negative x 2.  Chest x-ray showed no acute normality.  Patient received Reglan x 2 with improvement in nausea and vomiting.  ED attempted to get patient to regular dialysis session for today but was unable to do so.  Nephrology consulted and agreed to see patient for dialysis and patient was admitted for observation for dialysis and symptom control.  Review of Systems: As per HPI otherwise all other systems reviewed and are negative.  Past Medical History:  Diagnosis Date   Acute on chronic systolic CHF (congestive heart failure) (HCC) 12/21/2020   Allergic rhinitis     Anemia    Arthritis    Asthma    Chronic diastolic CHF (congestive heart failure) (HCC)    Chronic kidney disease    Dialysis Tu/TH/Sa   COPD (chronic obstructive pulmonary disease) (HCC)    COVID-19 03/28/2023   Depression    DM (diabetes mellitus) (HCC)    DVT (deep vein thrombosis) in pregnancy    Gastroenteritis 03/27/2023   GERD (gastroesophageal reflux disease)    HTN (hypertension)    Hyperlipidemia    Obesity    Pneumonia 04/2018   RIGHT LOBE   Sleep apnea    needs to be retested - to get new cpap    Past Surgical History:  Procedure Laterality Date   ABDOMINAL HYSTERECTOMY     AV FISTULA PLACEMENT Left 03/17/2022   Procedure: LEFT ARM ARTERIOVENOUS (AV) FISTULA CREATION;  Surgeon: Chuck Hint, MD;  [facility] OR;  Service: Vascular;  Laterality: Left;   BIOPSY  01/12/2023   Procedure: BIOPSY;  Surgeon: Jeani Hawking, MD;  Location: [facility] ENDOSCOPY;  Service: Gastroenterology;;   Thressa Sheller STUDY  11/26/2020   Procedure: BUBBLE STUDY;  Surgeon: Jodelle Red, MD;  Location: [facility] ENDOSCOPY;  Service: Cardiovascular;;   CARDIOVERSION N/A 05/06/2020   Procedure: CARDIOVERSION;  Surgeon: Jodelle Red, MD;  Location: [facility] ENDOSCOPY;  Service: Cardiovascular;  Laterality: N/A;   CARDIOVERSION N/A 11/04/2020   Procedure: CARDIOVERSION;  Surgeon: Lewayne Bunting, MD;  Location: [facility] ENDOSCOPY;  Service: Cardiovascular;  Laterality: N/A;   CARDIOVERSION N/A 02/01/2021   Procedure: CARDIOVERSION;  Surgeon: Dolores Patty, MD;  Location: [facility] ENDOSCOPY;  Service:  Cardiovascular;  Laterality: N/A;   CARDIOVERSION N/A 02/09/2021   Procedure: CARDIOVERSION;  Surgeon: Dolores Patty, MD;  Location: [facility] ENDOSCOPY;  Service: Cardiovascular;  Laterality: N/A;   CARDIOVERSION N/A 04/19/2021   Procedure: CARDIOVERSION;  Surgeon: Pricilla Riffle, MD;  Location: [facility] ENDOSCOPY;  Service: Cardiovascular;  Laterality: N/A;   CARDIOVERSION N/A 11/25/2021   Procedure:  CARDIOVERSION;  Surgeon: Dolores Patty, MD;  Location: [facility] ENDOSCOPY;  Service: Cardiovascular;  Laterality: N/A;   CARDIOVERSION N/A 01/31/2022   Procedure: CARDIOVERSION;  Surgeon: Dolores Patty, MD;  Location: [facility] ENDOSCOPY;  Service: Cardiovascular;  Laterality: N/A;   CATARACT EXTRACTION, BILATERAL     CHOLECYSTECTOMY     COLONOSCOPY WITH PROPOFOL N/A 03/05/2015   Procedure: COLONOSCOPY WITH PROPOFOL;  Surgeon: Jeani Hawking, MD;  Location: [facility] ENDOSCOPY;  Service: Endoscopy;  Laterality: N/A;   DIAGNOSTIC LAPAROSCOPY     ESOPHAGOGASTRODUODENOSCOPY (EGD) WITH PROPOFOL N/A 01/12/2023   Procedure: ESOPHAGOGASTRODUODENOSCOPY (EGD) WITH PROPOFOL;  Surgeon: Jeani Hawking, MD;  Location: [facility] ENDOSCOPY;  Service: Gastroenterology;  Laterality: N/A;   FISTULA SUPERFICIALIZATION Left 03/17/2022   Procedure: FISTULA SUPERFICIALIZATION -LEFT;  Surgeon: Chuck Hint, MD;  Location: [facility] OR;  Service: Vascular;  Laterality: Left;   ingrown hallux Left    IR FLUORO GUIDE CV LINE RIGHT  03/14/2022   IR US GUIDE VASC ACCESS RIGHT  03/14/2022   KNEE SURGERY     LEFT HEART CATH AND CORONARY ANGIOGRAPHY N/A 12/31/2017   Procedure: LEFT HEART CATH AND CORONARY ANGIOGRAPHY;  Surgeon: Rinaldo Cloud, MD;  Location: [facility] INVASIVE CV LAB;  Service: Cardiovascular;  Laterality: N/A;   MAZE N/A 12/29/2020   Procedure: MAZE;  Surgeon: Loreli Slot, MD;  Location: [facility] OR;  Service: Open Heart Surgery;  Laterality: N/A;   MITRAL VALVE REPAIR N/A 12/29/2020   Procedure: MITRAL VALVE REPAIR USING CARBOMEDICS ANNULOFLEX RING SIZE 30;  Surgeon: Loreli Slot, MD;  Location: [facility] OR;  Service: Open Heart Surgery;  Laterality: N/A;   REVISON OF ARTERIOVENOUS FISTULA Left 01/05/2023   Procedure: REVISION OF LEFT ARTERIOVENOUS FISTULA WITH INTERPOSITION PTFE GRAFT;  Surgeon: Chuck Hint, MD;  Location: [facility] OR;  Service: Vascular;  Laterality: Left;  ELIQUIS AND HOLD 48 X HOURS   RIGHT HEART CATH  N/A 12/22/2020   Procedure: RIGHT HEART CATH;  Surgeon: Laurey Morale, MD;  Location: [facility] INVASIVE CV LAB;  Service: Cardiovascular;  Laterality: N/A;   RIGHT HEART CATH N/A 01/31/2021   Procedure: RIGHT HEART CATH;  Surgeon: Dolores Patty, MD;  Location: [facility] INVASIVE CV LAB;  Service: Cardiovascular;  Laterality: N/A;   RIGHT HEART CATH N/A 07/29/2021   Procedure: RIGHT HEART CATH;  Surgeon: Dolores Patty, MD;  Location: [facility] INVASIVE CV LAB;  Service: Cardiovascular;  Laterality: N/A;   RIGHT HEART CATH N/A 11/24/2021   Procedure: RIGHT HEART CATH;  Surgeon: Dolores Patty, MD;  Location: [facility] INVASIVE CV LAB;  Service: Cardiovascular;  Laterality: N/A;   RIGHT HEART CATH N/A 02/14/2022   Procedure: RIGHT HEART CATH;  Surgeon: Dolores Patty, MD;  Location: [facility] INVASIVE CV LAB;  Service: Cardiovascular;  Laterality: N/A;   RIGHT HEART CATH N/A 02/22/2022   Procedure: RIGHT HEART CATH;  Surgeon: Laurey Morale, MD;  Location: [facility] INVASIVE CV LAB;  Service: Cardiovascular;  Laterality: N/A;   TEE WITHOUT CARDIOVERSION N/A 11/26/2020   Procedure: TRANSESOPHAGEAL ECHOCARDIOGRAM (TEE);  Surgeon: Jodelle Red, MD;  Location: [facility] ENDOSCOPY;  Service: Cardiovascular;  Laterality: N/A;  TEE WITHOUT CARDIOVERSION N/A 12/29/2020   Procedure: TRANSESOPHAGEAL ECHOCARDIOGRAM (TEE);  Surgeon: Loreli Slot, MD;  Location: [facility] OR;  Service: Open Heart Surgery;  Laterality: N/A;   TEE WITHOUT CARDIOVERSION N/A 01/20/2022   Procedure: TRANSESOPHAGEAL ECHOCARDIOGRAM (TEE);  Surgeon: Dolores Patty, MD;  Location: [facility] ENDOSCOPY;  Service: Cardiovascular;  Laterality: N/A;   TRICUSPID VALVE REPLACEMENT N/A 12/29/2020   Procedure: 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 RING SIZE 34;  Surgeon: Loreli Slot, MD;  Location: [facility] OR;  Service: Open Heart Surgery;  Laterality: N/A;    Social History  reports that she has never smoked. She has never been exposed to tobacco  smoke. She has never used smokeless tobacco. She reports that she does not drink alcohol and does not use drugs.  Allergies  Allergen Reactions   Bee Pollen Anaphylaxis and Other (See Comments)    UNCONFIRMED, but IS allergic to bee VENOM   Bee Venom Anaphylaxis   Sulfa Antibiotics Itching   Chlorhexidine    Iodinated Contrast Media Nausea And Vomiting    Family History  Problem Relation Age of Onset   Breast cancer Mother    Cancer Mother    Hypertension Mother    Cancer Father    Hypertension Sister    Hypertension Brother    CAD Other   Acute on admission  Prior to Admission medications   Medication Sig Start Date End Date Taking? Authorizing Provider  acetaminophen (TYLENOL) 325 MG tablet Take 2 tablets (650 mg total) by mouth every 6 (six) hours as needed for mild pain or moderate pain. 12/07/21  Yes Arrien, York Ram, MD  albuterol (PROVENTIL HFA) 108 (90 Base) MCG/ACT inhaler Inhale 1-2 puffs into the lungs every 6 (six) hours as needed for wheezing or shortness of breath. 10/13/22  Yes Grayce Sessions, NP  amiodarone (PACERONE) 200 MG tablet TAKE 1 TABLET BY MOUTH EVERY DAY 05/21/23  Yes Bensimhon, Bevelyn Buckles, MD  apixaban (ELIQUIS) 5 MG TABS tablet Take 1 tablet (5 mg total) by mouth 2 (two) times daily. 02/21/23  Yes Bensimhon, Bevelyn Buckles, MD  busPIRone (BUSPAR) 5 MG tablet TAKE ONE TABLET BY MOUTH TWICE DAILY @ 9AM & 5PM 09/06/22  Yes Grayce Sessions, NP  colchicine 0.6 MG tablet Take 1/2 (one-half) tablet by mouth once daily 11/23/22  Yes Newlin, Enobong, MD  Dulaglutide (TRULICITY) 1.5 MG/0.5ML SOPN Inject 1.5 mg into the skin once a week. 07/05/22  Yes Grayce Sessions, NP  DULoxetine (CYMBALTA) 60 MG capsule TAKE ONE CAPSULE (60MG  TOTAL) BY MOUTH DAILY AT 9AM 02/12/23  Yes Grayce Sessions, NP  EPINEPHrine 0.3 mg/0.3 mL IJ SOAJ injection Inject 0.3 mg into the muscle as needed for anaphylaxis. 06/25/20  Yes Grayce Sessions, NP  insulin lispro (HUMALOG  KWIKPEN) 100 UNIT/ML KwikPen Inject 10 Units into the skin 3 (three) times daily. Patient taking differently: Inject 0-5 Units into the skin 3 (three) times daily. Sliding scale 07/05/22  Yes Grayce Sessions, NP  lidocaine-prilocaine (EMLA) cream Apply 1 Application topically Every Tuesday,Thursday,and Saturday with dialysis. 06/01/22  Yes [provider]  loratadine (CLARITIN) 10 MG tablet TAKE ONE TABLET BY MOUTH DAILY AT 9AM 09/06/22  Yes Grayce Sessions, NP  meclizine (ANTIVERT) 25 MG tablet Take 1 tablet (25 mg total) by mouth 3 (three) times daily as needed for dizziness. 03/18/23  Yes Jonah Blue, MD  metoprolol tartrate (LOPRESSOR) 25 MG tablet Take 0.5 tablets (12.5 mg total) by mouth 2 (  two) times daily. 03/30/23 07/03/23 Yes Noralee Stain, DO  midodrine (PROAMATINE) 10 MG tablet Take 1 tablet (10 mg total) by mouth See admin instructions. Take 10mg  (1 tablet) by mouth on Tuesday's, Thursday's, and Saturday's. 03/30/23  Yes Noralee Stain, DO  Multiple Vitamins-Minerals (MULTIVITAMIN WOMEN PO) Take 1 tablet by mouth daily.   Yes [provider]  nitroGLYCERIN (NITROSTAT) 0.4 MG SL tablet Place 1 tablet (0.4 mg total) under the tongue every 5 (five) minutes x 3 doses as needed for chest pain. 01/08/14  Yes Rinaldo Cloud, MD  ondansetron (ZOFRAN) 8 MG tablet Take 8 mg by mouth 2 (two) times daily as needed. 06/10/23  Yes [provider]  pantoprazole (PROTONIX) 40 MG tablet Take 1 tablet (40 mg total) by mouth daily. 04/24/23  Yes Grayce Sessions, NP  rosuvastatin (CRESTOR) 10 MG tablet TAKE ONE TABLET BY MOUTH DAILY AT 5PM 04/13/23  Yes Bensimhon, Bevelyn Buckles, MD  Blood Glucose Monitoring Suppl (ACCU-CHEK GUIDE) w/Device KIT Use to check blood sugar TID. 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 Dx: E11.69 Last reading: 105, 02-20-2023, Fasting. 06/28/22   Hoy Register, MD  glucose blood (ACCU-CHEK GUIDE) test strip USE TO CHECK  BLOOD SUGAR 3 TIMES DAILY 04/03/22   Hoy Register, MD    Physical Exam: Vitals:   07/03/23 1415 07/03/23 1515 07/03/23 1530 07/03/23 1655  BP: (!) 114/94 (!) 115/90 120/79 108/75  Pulse: 98 88  98  Resp: 16 16 16  (!) 22  Temp:    97.6 F (36.4 C)  TempSrc:    Oral  SpO2: 96% 95%  98%  Weight:    98.9 kg  Height:    5\' 7"  (1.702 m)    Physical Exam Constitutional:      General: She is not in acute distress.    Appearance: Normal appearance.  HENT:     Head: Normocephalic and atraumatic.     Mouth/Throat:     Mouth: Mucous membranes are moist.     Pharynx: Oropharynx is clear.  Eyes:     Extraocular Movements: Extraocular movements intact.     Pupils: Pupils are equal, round, and reactive to light.  Cardiovascular:     Rate and Rhythm: Normal rate. Rhythm irregular.     Pulses: Normal pulses.     Heart sounds: Normal heart sounds.  Pulmonary:     Effort: Pulmonary effort is normal. No respiratory distress.     Breath sounds: Rales (Trace basilar) present.  Abdominal:     General: Bowel sounds are normal. There is no distension.     Palpations: Abdomen is soft.     Tenderness: There is no abdominal tenderness.  Musculoskeletal:        General: No swelling or deformity.     Right lower leg: Edema present.     Left lower leg: Edema present.  Skin:    General: Skin is warm and dry.  Neurological:     General: No focal deficit present.     Mental Status: Mental status is at baseline.    Labs on Admission: I have personally reviewed following labs and imaging studies  CBC: Recent Labs  Lab 07/03/23 0700  WBC 6.1  HGB 10.7*  HCT 33.3*  MCV 96.8  PLT 170    Basic Metabolic Panel: Recent Labs  Lab 07/03/23 0700  NA 137  K 4.7  CL 93*  CO2 30  GLUCOSE 129*  BUN 43*  CREATININE 13.25*  CALCIUM  9.8    GFR: Estimated Creatinine Clearance: 5 mL/min (A) (by C-G formula based on SCr of 13.25 mg/dL (H)).  Liver Function Tests: No results for input(s):  "AST", "ALT", "ALKPHOS", "BILITOT", "PROT", "ALBUMIN" in the last 168 hours.  Urine analysis:    Component Value Date/Time   COLORURINE AMBER (A) 11/07/2022 1447   APPEARANCEUR CLOUDY (A) 11/07/2022 1447   LABSPEC 1.015 11/07/2022 1447   PHURINE 5.0 11/07/2022 1447   GLUCOSEU NEGATIVE 11/07/2022 1447   HGBUR SMALL (A) 11/07/2022 1447   BILIRUBINUR small (A) 02/21/2023 1748   KETONESUR trace (5) (A) 02/21/2023 1748   KETONESUR NEGATIVE 11/07/2022 1447   PROTEINUR =100 (A) 02/21/2023 1748   PROTEINUR 100 (A) 11/07/2022 1447   UROBILINOGEN 0.2 02/21/2023 1748   UROBILINOGEN 1.0 10/26/2014 0249   NITRITE Negative 02/21/2023 1748   NITRITE NEGATIVE 11/07/2022 1447   LEUKOCYTESUR Negative 02/21/2023 1748   LEUKOCYTESUR TRACE (A) 11/07/2022 1447    Radiological Exams on Admission: DG Chest Port 1 View  Result Date: 07/03/2023 CLINICAL DATA:  Chest pain. EXAM: PORTABLE CHEST 1 VIEW COMPARISON:  06/18/2023. FINDINGS: Low lung volume. There is mild pulmonary vascular congestion, likely accentuated by low lung volume. No frank pulmonary edema. Elevated right hemidiaphragm. Bilateral lung fields are otherwise clear. No acute consolidation or lung collapse. Bilateral lateral costophrenic angles are clear. Stable mildly enlarged cardio-mediastinal silhouette. There are surgical staples along the heart border and sternotomy wires, status post CABG (coronary artery bypass graft). There is a left atrial appendage closure device, unchanged. No acute osseous abnormalities. The soft tissues are within normal limits. IMPRESSION: *No acute cardiopulmonary abnormality. Electronically Signed   By: Jules Schick M.D.   On: 07/03/2023 08:13    EKG: Independently reviewed.  Atrial flutter at 85 bpm, nonspecific T wave flattening multiple leads.  Low voltage multiple leads.  Right bundle branch block.  QTc persistently prolonged at 530.  Assessment/Plan Principal Problem:   Dyspnea Active Problems:   ESRD  on dialysis (HCC)   Gastroparesis   GERD (gastroesophageal reflux disease)   OSA on CPAP   Essential hypertension   COPD (chronic obstructive pulmonary disease) (HCC)   Class 3 obesity   Type 2 diabetes mellitus with hyperlipidemia (HCC)   Atrial fibrillation, permanent (HCC)   Chronic diastolic CHF (congestive heart failure) (HCC)   QT prolongation   Anxiety and depression   Chronic deep vein thrombosis (DVT) of other vein of right upper extremity (HCC)   ESRD on HD Missed dialysis session > Is dialysis on Saturday and again today due to feeling unwell and then unable to get to today's session after evaluation in the ED. > Nephrology consulted in the ED and patient will get dialysis here and observe overnight - Monitoring on telemetry - Appreciate nephrology recommendations and assistance - Trend renal function and electrolytes  Chest pain > Patient reporting 2 days of chest pain with some associated shortness of breath.  No specific aggravating or alleviating factors.  Troponin negative x 2 in the ED.  May be related to volume overload in the setting of missed HD. - Supportive care  Nausea and vomiting Gastroparesis Known history of gastroparesis in the setting of diabetes.  Presenting with ongoing nausea vomiting triggered by eating.>  Improved with Reglan in the ED. - Unfortunately we will need to hold off on further Reglan due to QTc prolongation (530) - Supportive care, consider alternative antiemetics if needed  QTc prolongation > Known history of QTc prolongation.  EKG in  the ED showed persistently prolonged QTc at 530. - Avoid QT prolonging medications. - Repeat EKG in a.m.  Hypertension - Continue home metoprolol  Diabetes - SSI  Atrial fibrillation - Continue home amiodarone, metoprolol, Eliquis  GERD - Continue home PPI  COPD - Holding albuterol in the setting of QTc prolongation  Chronic diastolic CHF > Last echo with a EF 50-50%, indeterminate  diastolic function, normal RV function. - Following managed with HD  Anxiety Depression - Continue BuSpar and duloxetine  History of DVT - On Eliquis as above  OSA - Continue home CPAP  Obesity - Noted  Status post mitral valve repair and tricuspid valve repair - Noted   DVT prophylaxis: Eliquis Code Status:   Full Family Communication:  Updated at bedside  Disposition Plan:   Patient is from:  Home  Anticipated DC to:  Home  Anticipated DC date:  1 to 2 days  Anticipated DC barriers: None  Consults called:  Nephrology Admission status:  Observation, telemetry  Severity of Illness: The appropriate patient status for this patient is OBSERVATION. Observation status is judged to be reasonable and necessary in order to provide the required intensity of service to ensure the patient's safety. The patient's presenting symptoms, physical exam findings, and initial radiographic and laboratory data in the context of their medical condition is felt to place them at decreased risk for further clinical deterioration. Furthermore, it is anticipated that the patient will be medically stable for discharge from the hospital within 2 midnights of admission.    Synetta Fail MD [facility]  How to contact the [facility] Attending or Consulting provider 7A - 7P or covering provider during after hours 7P -7A, for this patient?   Check the care team in [facility] and look for a) attending/consulting [facility] provider listed and b) the [facility] team listed Log into [email or web address] and use [facility]'s universal password to access. If you do not have the password, please contact the hospital operator. Locate the [facility] provider you are looking for under [facility] and page to a number that you can be directly reached. If you still have difficulty reaching the provider, please page the [facility] (Director on Call) for the Hospitalists listed on amion for assistance.  07/03/2023, 5:56 PM

## 2023-07-03 NOTE — ED Notes (Signed)
Pt reports today was her dialysis day but will miss it

## 2023-07-03 NOTE — ED Notes (Signed)
Patient provided with [facility] for oral rehydration.

## 2023-07-03 NOTE — Progress Notes (Signed)
Call received from -ED attending at [facility] Request is for  -inpatient management   Information received from ED attending as below:   [name] is a 67 y.o. female with PMH significant for ESRD HD TTS, DM2, HTN, HLD, morbid obesity, COPD, CHF  presented to the ED this morning with complaint of chest pain radiating to left side, nausea and vomiting since yesterday. Patient also missed her dialysis on Saturday.  Last dialysis was on Thursday 11/7.   Given IV Reglan in the ED QTc prolonged at 530 ms EDP tried to get her to her regular dialysis but was not successful.  He discussed with nephrology.  Will accept on observation for dialysis. Troponin negative so far Nausea probably due to gastroparesis.  Observation bed requested for medical telemetry. Please notify nephrology once patient arrives

## 2023-07-03 NOTE — Progress Notes (Signed)
NEW ADMISSION NOTE New Admission Note:   Arrival Method: Patient came [facility] ED Mental Orientation: Patient is alert and oriented x 4.  Telemetry: 62M-10 Afib Assessment: Completed Skin: WNL IV: R FA SL Pain: No pain. Tubes: N/A Safety Measures: Safety Fall Prevention Plan has been given, discussed and implemented. Admission: Completed [facility] Orientation: Patient has been orientated to the room, unit and staff.  Family: NOne  Orders have been reviewed and implemented. Will continue to monitor the patient. Call light has been placed within reach and bed alarm has been activated.   Arvilla Meres, RN

## 2023-07-03 NOTE — ED Provider Notes (Addendum)
[place] EMERGENCY DEPARTMENT AT [facility] Provider Note   CSN: [id number] Arrival date & time: 07/03/23  0932     History  Chief Complaint  Patient presents with   Chest Pain    [name] is a 67 y.o. female.  HPI    66 year old female with history of diabetes, chronic A-fib on blood thinners, nonischemic cardiomyopathy, gastroparesis comes in with chief complaint of chest pain, nausea and vomiting.  Patient indicates that she has been feeling unwell for the last at least a week.  Her symptoms predominantly have been nausea, vomiting with emesis that is triggered with food intake.  She has missed her dialysis on Saturday because she was feeling unwell.  She comes into the ER today because since yesterday she has been having intermittent episodes of left-sided chest pain radiating to the left shoulder.  The symptoms have no specific evoking, aggravating or relieving factors.  She has had some shortness of breath as well with persistent nausea and vomiting.  No history of similar symptoms in the past.  Home Medications Prior to Admission medications   Medication Sig Start Date End Date Taking? Authorizing Provider  acetaminophen (TYLENOL) 325 MG tablet Take 2 tablets (650 mg total) by mouth every 6 (six) hours as needed for mild pain or moderate pain. 12/07/21   Arrien, York Ram, MD  albuterol (PROVENTIL HFA) 108 (90 Base) MCG/ACT inhaler Inhale 1-2 puffs into the lungs every 6 (six) hours as needed for wheezing or shortness of breath. 10/13/22   Grayce Sessions, NP  amiodarone (PACERONE) 200 MG tablet TAKE 1 TABLET BY MOUTH EVERY DAY 05/21/23   Bensimhon, Bevelyn Buckles, MD  apixaban (ELIQUIS) 5 MG TABS tablet Take 1 tablet (5 mg total) by mouth 2 (two) times daily. 02/21/23   Bensimhon, Bevelyn Buckles, MD  Blood Glucose Monitoring Suppl (ACCU-CHEK GUIDE) w/Device KIT Use to check blood sugar TID. Dx: E11.69 Patient taking differently: Use to check blood sugar  TID. Dx: E11.69 Last reading: 105, 02-20-2023, Fasting. 06/28/22   Hoy Register, MD  busPIRone (BUSPAR) 5 MG tablet TAKE ONE TABLET BY MOUTH TWICE DAILY @ 9AM & 5PM 09/06/22   Grayce Sessions, NP  colchicine 0.6 MG tablet Take 1/2 (one-half) tablet by mouth once daily 11/23/22   Hoy Register, MD  Dulaglutide (TRULICITY) 1.5 MG/0.5ML SOPN Inject 1.5 mg into the skin once a week. 07/05/22   Grayce Sessions, NP  DULoxetine (CYMBALTA) 60 MG capsule TAKE ONE CAPSULE (60MG  TOTAL) BY MOUTH DAILY AT 9AM 02/12/23   Grayce Sessions, NP  EPINEPHrine 0.3 mg/0.3 mL IJ SOAJ injection Inject 0.3 mg into the muscle as needed for anaphylaxis. 06/25/20   Grayce Sessions, NP  glucose blood (ACCU-CHEK GUIDE) test strip USE TO CHECK BLOOD SUGAR 3 TIMES DAILY 04/03/22   Hoy Register, MD  insulin lispro (HUMALOG KWIKPEN) 100 UNIT/ML KwikPen Inject 10 Units into the skin 3 (three) times daily. Patient taking differently: Inject 0-5 Units into the skin 3 (three) times daily. Sliding scale 07/05/22   Grayce Sessions, NP  lidocaine-prilocaine (EMLA) cream Apply 1 Application topically Every Tuesday,Thursday,and Saturday with dialysis. 06/01/22   [provider]  loratadine (CLARITIN) 10 MG tablet TAKE ONE TABLET BY MOUTH DAILY AT 9AM 09/06/22   Grayce Sessions, NP  meclizine (ANTIVERT) 25 MG tablet Take 1 tablet (25 mg total) by mouth 3 (three) times daily as needed for dizziness. 03/18/23   Jonah Blue, MD  metoprolol tartrate (LOPRESSOR)  25 MG tablet Take 0.5 tablets (12.5 mg total) by mouth 2 (two) times daily. 03/30/23 06/04/23  Noralee Stain, DO  midodrine (PROAMATINE) 10 MG tablet Take 1 tablet (10 mg total) by mouth See admin instructions. Take 10mg  (1 tablet) by mouth on Tuesday's, Thursday's, and Saturday's. 03/30/23   Noralee Stain, DO  Multiple Vitamins-Minerals (MULTIVITAMIN WOMEN PO) Take 1 tablet by mouth daily.    [provider]  nitroGLYCERIN (NITROSTAT) 0.4 MG SL  tablet Place 1 tablet (0.4 mg total) under the tongue every 5 (five) minutes x 3 doses as needed for chest pain. 01/08/14   Rinaldo Cloud, MD  ondansetron (ZOFRAN) 4 MG tablet Take 1 tablet (4 mg total) by mouth every 6 (six) hours. Patient taking differently: Take 4 mg by mouth every 6 (six) hours as needed for nausea or vomiting. 02/24/23   Wynetta Fines, MD  oxyCODONE-acetaminophen (PERCOCET/ROXICET) 5-325 MG tablet Take 1 tablet by mouth every 6 (six) hours as needed for severe pain (pain score 7-10). 06/18/23   Smitty Knudsen, PA-C  pantoprazole (PROTONIX) 40 MG tablet Take 1 tablet (40 mg total) by mouth daily. 04/24/23   Grayce Sessions, NP  predniSONE (STERAPRED UNI-PAK 21 TAB) 10 MG (21) TBPK tablet Prednisone dose pack directions Day 1 take 6 tablets Day 2 take 5 tablets Day 3 take 4 tablets Day 4 take 3 tablets Day 3 take 2 tablets Day 4 take 1 tablet Dispense 21 tablets 05/03/23   Grayce Sessions, NP  rosuvastatin (CRESTOR) 10 MG tablet TAKE ONE TABLET BY MOUTH DAILY AT 5PM 04/13/23   Bensimhon, Bevelyn Buckles, MD      Allergies    Bee pollen, Bee venom, Sulfa antibiotics, Chlorhexidine, and Iodinated contrast media    Review of Systems   Review of Systems  All other systems reviewed and are negative.   Physical Exam Updated Vital Signs BP (!) 141/82 (BP Location: Left Arm)   Pulse 87   Temp 97.8 F (36.6 C) (Oral)   Resp 18   Ht 5\' 7"  (1.702 m)   Wt 124.7 kg   SpO2 98%   BMI 43.07 kg/m  Physical Exam Vitals and nursing note reviewed.  Constitutional:      Appearance: She is well-developed.  HENT:     Head: Atraumatic.  Neck:     Comments: Distended neck vein Cardiovascular:     Rate and Rhythm: Normal rate.     Heart sounds: Normal heart sounds.  Pulmonary:     Effort: Pulmonary effort is normal.     Breath sounds: Examination of the right-lower field reveals rales. Examination of the left-lower field reveals rales. Rales present.  Abdominal:     Palpations:  Abdomen is soft.  Musculoskeletal:     Cervical back: Normal range of motion and neck supple.  Skin:    General: Skin is warm and dry.  Neurological:     Mental Status: She is alert and oriented to person, place, and time.     ED Results / Procedures / Treatments   Labs (all labs ordered are listed, but only abnormal results are displayed) Labs Reviewed  BASIC METABOLIC PANEL - Abnormal; Notable for the following components:      Result Value   Chloride 93 (*)    Glucose, Bld 129 (*)    BUN 43 (*)    Creatinine, Ser 13.25 (*)    GFR, Estimated 3 (*)    All other components within normal limits  CBC -  Abnormal; Notable for the following components:   RBC 3.44 (*)    Hemoglobin 10.7 (*)    HCT 33.3 (*)    RDW 16.5 (*)    All other components within normal limits  TROPONIN I (HIGH SENSITIVITY)  TROPONIN I (HIGH SENSITIVITY)    EKG EKG Interpretation Date/Time:  Tuesday July 03 2023 06:27:57 EST Ventricular Rate:  107 PR Interval:    QRS Duration:  108 QT Interval:  403 QTC Calculation: 538 R Axis:   157  Text Interpretation: Atrial fibrillation Probable right ventricular hypertrophy Prolonged QT interval Confirmed by Drema Pry [phone]) on 07/03/2023 6:34:28 AM    EKG Interpretation Date/Time:  Tuesday July 03 2023 10:33:18 EST Ventricular Rate:  85 PR Interval:    QRS Duration:  113 QT Interval:  438 QTC Calculation: 530 R Axis:   140  Text Interpretation: Atrial flutter IRBBB and LPFB Nonspecific T abnormalities, lateral leads Prolonged QT interval persistent QT prolongation Confirmed by Derwood Kaplan ([id number]) on 07/03/2023 10:46:00 AM        Radiology DG Chest Port 1 View  Result Date: 07/03/2023 CLINICAL DATA:  Chest pain. EXAM: PORTABLE CHEST 1 VIEW COMPARISON:  06/18/2023. FINDINGS: Low lung volume. There is mild pulmonary vascular congestion, likely accentuated by low lung volume. No frank pulmonary edema. Elevated right hemidiaphragm.  Bilateral lung fields are otherwise clear. No acute consolidation or lung collapse. Bilateral lateral costophrenic angles are clear. Stable mildly enlarged cardio-mediastinal silhouette. There are surgical staples along the heart border and sternotomy wires, status post CABG (coronary artery bypass graft). There is a left atrial appendage closure device, unchanged. No acute osseous abnormalities. The soft tissues are within normal limits. IMPRESSION: *No acute cardiopulmonary abnormality. Electronically Signed   By: Jules Schick M.D.   On: 07/03/2023 08:13    Procedures Procedures    Medications Ordered in ED Medications  metoCLOPramide (REGLAN) injection 10 mg (10 mg Intravenous Given 07/03/23 0747)    ED Course/ Medical Decision Making/ A&P             HEART Score: 5                    Medical Decision Making Amount and/or Complexity of Data Reviewed Labs: ordered.  Risk Prescription drug management. Decision regarding hospitalization.  This patient presents to the ED with chief complaint(s) of left-sided chest pain, persistent nausea and vomiting with pertinent past medical history of ESRD on hemodialysis, diabetes, right-sided heart failure/CHF.The complaint involves an extensive differential diagnosis and also carries with it a high risk of complications and morbidity.    The differential diagnosis includes : Acute coronary syndrome, gastroparesis, esophagitis, gastritis, chest wall pain, pneumonia, pulmonary edema, severe electrolyte abnormality, PUD, uremia, hyperkalemia. Abdominal exam is reassuring.  Doubt SBO.  The initial plan is to get basic labs.  Her initial EKG reveals prolonged QTc.  Will give patient some Reglan and reassess.   Additional history obtained: Additional history obtained from spouse Records reviewed previous admission documents and previous cardiac workup including coronary cath from 2019, left-sided heart cath was normal at that time.  Patient has  had multiple right-sided heart cath subsequently.  Independent labs interpretation:  The following labs were independently interpreted:  Patient's initial lab workup reveals normal white count. Patient's K is normal at 4.7.  BUN is 43. Patient does not have severe anemia.  Initial troponin is normal.  I have independently interpreted patient's EKG.  Patient does have prolonged QTc of 530.  Independent visualization and interpretation of imaging: - I independently visualized the following imaging with scope of interpretation limited to determining acute life threatening conditions related to emergency care: X-ray of the chest, which revealed no significant pulmonary edema.  Treatment and Reassessment: Patient reassessed.  She states that she still feels nauseous.  Reglan has helped her and she has kept some of the fluid down.  I am concerned about the prolonged QTc.  I repeated the EKG, and the QTc is still prolonged at 530.  Patient has hear score of 6.  She has right-sided heart failure and CHF along with ESRD with multiple missed dialysis sessions.  I think it would be best to admit the patient for optimization, ACS rule out and getting better symptom management of her gastroparesis that is safe given her prolonged QTc.  Consultation: - Consulted or discussed management/test interpretation with external professional: Nephrology.  Spoke with Dr. Glenna Fellows, she will inform the appropriate staff about patient needing dialysis after admission.  I have reviewed patient's rhythm strip while she has been on cardiac telemetry monitoring.  She has not had any dysrhythmia.  Final Clinical Impression(s) / ED Diagnoses Final diagnoses:  Prolonged Q-T interval on ECG  Gastroparesis  ESRD on dialysis [facility])  Angina pectoris [facility])    Rx / DC Orders ED Discharge Orders     None         Derwood Kaplan, MD 07/03/23 1100    Derwood Kaplan, MD 07/03/23 1101

## 2023-07-03 NOTE — Consult Note (Signed)
[facility] Renal Consultation Note    Indication for Consultation:  Management of ESRD/hemodialysis; anemia, hypertension/volume and secondary hyperparathyroidism  HPI: [name] is a 67 y.o. female with a PMH significant for HTN, DM type 2, COPD, chronic combined diastolic and systolic CHF, severe MR/TR, persistent atrial fibrillation on Eliquis, and ESRD on HD TTS at [facility] who presented to [facility] ED today c/o chest pain associated with N/V x 2 days.  Pt also reports malaise for the past week.  In the ED, Temp 97.8, Bp 134/84, HR 94, RR 18, SpO2 100%.  Labs notable for K 4.7, BUN 43, Cr 13.25, Hgb 10.7, glucose 129, troponin I 6.  CXR without acute cardiopulmonary abnormality.  She is being admitted under observation and we were consulted to provide dialysis during her hospitalization.   She also complains of some abdominal discomfort but denies any diarrhea, hematochezia, melena, BRBPR, or SOB.  Past Medical History:  Diagnosis Date   Acute on chronic systolic CHF (congestive heart failure) (HCC) 12/21/2020   Allergic rhinitis    Anemia    Arthritis    Asthma    Chronic diastolic CHF (congestive heart failure) (HCC)    Chronic kidney disease    Dialysis Tu/TH/Sa   COPD (chronic obstructive pulmonary disease) (HCC)    COVID-19 03/28/2023   Depression    DM (diabetes mellitus) (HCC)    DVT (deep vein thrombosis) in pregnancy    Gastroenteritis 03/27/2023   GERD (gastroesophageal reflux disease)    HTN (hypertension)    Hyperlipidemia    Obesity    Pneumonia 04/2018   RIGHT LOBE   Sleep apnea    needs to be retested - to get new cpap   Past Surgical History:  Procedure Laterality Date   ABDOMINAL HYSTERECTOMY     AV FISTULA PLACEMENT Left 03/17/2022   Procedure: LEFT ARM ARTERIOVENOUS (AV) FISTULA CREATION;  Surgeon: Chuck Hint, MD;  Location: [facility] OR;  Service: Vascular;  Laterality: Left;   BIOPSY  01/12/2023   Procedure:  BIOPSY;  Surgeon: Jeani Hawking, MD;  Location: [facility] ENDOSCOPY;  Service: Gastroenterology;;   Thressa Sheller STUDY  11/26/2020   Procedure: BUBBLE STUDY;  Surgeon: Jodelle Red, MD;  Location: [facility] ENDOSCOPY;  Service: Cardiovascular;;   CARDIOVERSION N/A 05/06/2020   Procedure: CARDIOVERSION;  Surgeon: Jodelle Red, MD;  Location: [facility] ENDOSCOPY;  Service: Cardiovascular;  Laterality: N/A;   CARDIOVERSION N/A 11/04/2020   Procedure: CARDIOVERSION;  Surgeon: Lewayne Bunting, MD;  Location: [facility] ENDOSCOPY;  Service: Cardiovascular;  Laterality: N/A;   CARDIOVERSION N/A 02/01/2021   Procedure: CARDIOVERSION;  Surgeon: Dolores Patty, MD;  Location: [facility] ENDOSCOPY;  Service: Cardiovascular;  Laterality: N/A;   CARDIOVERSION N/A 02/09/2021   Procedure: CARDIOVERSION;  Surgeon: Dolores Patty, MD;  Location: [facility] ENDOSCOPY;  Service: Cardiovascular;  Laterality: N/A;   CARDIOVERSION N/A 04/19/2021   Procedure: CARDIOVERSION;  Surgeon: Pricilla Riffle, MD;  Location: [facility] ENDOSCOPY;  Service: Cardiovascular;  Laterality: N/A;   CARDIOVERSION N/A 11/25/2021   Procedure: CARDIOVERSION;  Surgeon: Dolores Patty, MD;  Location: [facility] ENDOSCOPY;  Service: Cardiovascular;  Laterality: N/A;   CARDIOVERSION N/A 01/31/2022   Procedure: CARDIOVERSION;  Surgeon: Dolores Patty, MD;  Location: [facility] ENDOSCOPY;  Service: Cardiovascular;  Laterality: N/A;   CATARACT EXTRACTION, BILATERAL     CHOLECYSTECTOMY     COLONOSCOPY WITH PROPOFOL N/A 03/05/2015   Procedure: COLONOSCOPY WITH PROPOFOL;  Surgeon: Jeani Hawking, MD;  Location: [facility] ENDOSCOPY;  Service:  Endoscopy;  Laterality: N/A;   DIAGNOSTIC LAPAROSCOPY     ESOPHAGOGASTRODUODENOSCOPY (EGD) WITH PROPOFOL N/A 01/12/2023   Procedure: ESOPHAGOGASTRODUODENOSCOPY (EGD) WITH PROPOFOL;  Surgeon: Jeani Hawking, MD;  Location: [facility] ENDOSCOPY;  Service: Gastroenterology;  Laterality: N/A;   FISTULA SUPERFICIALIZATION Left 03/17/2022   Procedure: FISTULA SUPERFICIALIZATION  -LEFT;  Surgeon: Chuck Hint, MD;  Location: [facility] OR;  Service: Vascular;  Laterality: Left;   ingrown hallux Left    IR FLUORO GUIDE CV LINE RIGHT  03/14/2022   IR US GUIDE VASC ACCESS RIGHT  03/14/2022   KNEE SURGERY     LEFT HEART CATH AND CORONARY ANGIOGRAPHY N/A 12/31/2017   Procedure: LEFT HEART CATH AND CORONARY ANGIOGRAPHY;  Surgeon: Rinaldo Cloud, MD;  Location: [facility] INVASIVE CV LAB;  Service: Cardiovascular;  Laterality: N/A;   MAZE N/A 12/29/2020   Procedure: MAZE;  Surgeon: Loreli Slot, MD;  Location: [facility] OR;  Service: Open Heart Surgery;  Laterality: N/A;   MITRAL VALVE REPAIR N/A 12/29/2020   Procedure: MITRAL VALVE REPAIR USING CARBOMEDICS ANNULOFLEX RING SIZE 30;  Surgeon: Loreli Slot, MD;  Location: [facility] OR;  Service: Open Heart Surgery;  Laterality: N/A;   REVISON OF ARTERIOVENOUS FISTULA Left 01/05/2023   Procedure: REVISION OF LEFT ARTERIOVENOUS FISTULA WITH INTERPOSITION PTFE GRAFT;  Surgeon: Chuck Hint, MD;  Location: [facility] OR;  Service: Vascular;  Laterality: Left;  ELIQUIS AND HOLD 48 X HOURS   RIGHT HEART CATH N/A 12/22/2020   Procedure: RIGHT HEART CATH;  Surgeon: Laurey Morale, MD;  Location: [facility] INVASIVE CV LAB;  Service: Cardiovascular;  Laterality: N/A;   RIGHT HEART CATH N/A 01/31/2021   Procedure: RIGHT HEART CATH;  Surgeon: Dolores Patty, MD;  Location: [facility] INVASIVE CV LAB;  Service: Cardiovascular;  Laterality: N/A;   RIGHT HEART CATH N/A 07/29/2021   Procedure: RIGHT HEART CATH;  Surgeon: Dolores Patty, MD;  Location: [facility] INVASIVE CV LAB;  Service: Cardiovascular;  Laterality: N/A;   RIGHT HEART CATH N/A 11/24/2021   Procedure: RIGHT HEART CATH;  Surgeon: Dolores Patty, MD;  Location: [facility] INVASIVE CV LAB;  Service: Cardiovascular;  Laterality: N/A;   RIGHT HEART CATH N/A 02/14/2022   Procedure: RIGHT HEART CATH;  Surgeon: Dolores Patty, MD;  Location: [facility] INVASIVE CV LAB;  Service: Cardiovascular;  Laterality: N/A;    RIGHT HEART CATH N/A 02/22/2022   Procedure: RIGHT HEART CATH;  Surgeon: Laurey Morale, MD;  Location: [facility] INVASIVE CV LAB;  Service: Cardiovascular;  Laterality: N/A;   TEE WITHOUT CARDIOVERSION N/A 11/26/2020   Procedure: TRANSESOPHAGEAL ECHOCARDIOGRAM (TEE);  Surgeon: Jodelle Red, MD;  Location: [facility] ENDOSCOPY;  Service: Cardiovascular;  Laterality: N/A;   TEE WITHOUT CARDIOVERSION N/A 12/29/2020   Procedure: TRANSESOPHAGEAL ECHOCARDIOGRAM (TEE);  Surgeon: Loreli Slot, MD;  Location: [facility] OR;  Service: Open Heart Surgery;  Laterality: N/A;   TEE WITHOUT CARDIOVERSION N/A 01/20/2022   Procedure: TRANSESOPHAGEAL ECHOCARDIOGRAM (TEE);  Surgeon: Dolores Patty, MD;  Location: [facility] ENDOSCOPY;  Service: Cardiovascular;  Laterality: N/A;   TRICUSPID VALVE REPLACEMENT N/A 12/29/2020   Procedure: 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 RING SIZE 34;  Surgeon: Loreli Slot, MD;  Location: [facility] OR;  Service: Open Heart Surgery;  Laterality: N/A;   Family History:   Family History  Problem Relation Age of Onset   Breast cancer Mother    Cancer Mother    Hypertension Mother    Cancer Father    Hypertension Sister    Hypertension  Brother    CAD Other    Social History:  reports that she has never smoked. She has never been exposed to tobacco smoke. She has never used smokeless tobacco. She reports that she does not drink alcohol and does not use drugs. Allergies  Allergen Reactions   Bee Pollen Anaphylaxis and Other (See Comments)    UNCONFIRMED, but IS allergic to bee VENOM   Bee Venom Anaphylaxis   Sulfa Antibiotics Itching   Chlorhexidine    Iodinated Contrast Media Nausea And Vomiting   Prior to Admission medications   Medication Sig Start Date End Date Taking? Authorizing Provider  acetaminophen (TYLENOL) 325 MG tablet Take 2 tablets (650 mg total) by mouth every 6 (six) hours as needed for mild pain or moderate pain. 12/07/21  Yes Arrien, York Ram,  MD  albuterol (PROVENTIL HFA) 108 (90 Base) MCG/ACT inhaler Inhale 1-2 puffs into the lungs every 6 (six) hours as needed for wheezing or shortness of breath. 10/13/22  Yes Grayce Sessions, NP  amiodarone (PACERONE) 200 MG tablet TAKE 1 TABLET BY MOUTH EVERY DAY 05/21/23  Yes Bensimhon, Bevelyn Buckles, MD  apixaban (ELIQUIS) 5 MG TABS tablet Take 1 tablet (5 mg total) by mouth 2 (two) times daily. 02/21/23  Yes Bensimhon, Bevelyn Buckles, MD  busPIRone (BUSPAR) 5 MG tablet TAKE ONE TABLET BY MOUTH TWICE DAILY @ 9AM & 5PM 09/06/22  Yes Grayce Sessions, NP  colchicine 0.6 MG tablet Take 1/2 (one-half) tablet by mouth once daily 11/23/22  Yes Newlin, Enobong, MD  Dulaglutide (TRULICITY) 1.5 MG/0.5ML SOPN Inject 1.5 mg into the skin once a week. 07/05/22  Yes Grayce Sessions, NP  DULoxetine (CYMBALTA) 60 MG capsule TAKE ONE CAPSULE (60MG  TOTAL) BY MOUTH DAILY AT 9AM 02/12/23  Yes Grayce Sessions, NP  EPINEPHrine 0.3 mg/0.3 mL IJ SOAJ injection Inject 0.3 mg into the muscle as needed for anaphylaxis. 06/25/20  Yes Grayce Sessions, NP  insulin lispro (HUMALOG KWIKPEN) 100 UNIT/ML KwikPen Inject 10 Units into the skin 3 (three) times daily. Patient taking differently: Inject 0-5 Units into the skin 3 (three) times daily. Sliding scale 07/05/22  Yes Grayce Sessions, NP  lidocaine-prilocaine (EMLA) cream Apply 1 Application topically Every Tuesday,Thursday,and Saturday with dialysis. 06/01/22  Yes [provider]  loratadine (CLARITIN) 10 MG tablet TAKE ONE TABLET BY MOUTH DAILY AT 9AM 09/06/22  Yes Grayce Sessions, NP  meclizine (ANTIVERT) 25 MG tablet Take 1 tablet (25 mg total) by mouth 3 (three) times daily as needed for dizziness. 03/18/23  Yes Jonah Blue, MD  metoprolol tartrate (LOPRESSOR) 25 MG tablet Take 0.5 tablets (12.5 mg total) by mouth 2 (two) times daily. 03/30/23 07/03/23 Yes Noralee Stain, DO  midodrine (PROAMATINE) 10 MG tablet Take 1 tablet (10 mg total) by mouth See admin  instructions. Take 10mg  (1 tablet) by mouth on Tuesday's, Thursday's, and Saturday's. 03/30/23  Yes Noralee Stain, DO  Multiple Vitamins-Minerals (MULTIVITAMIN WOMEN PO) Take 1 tablet by mouth daily.   Yes [provider]  nitroGLYCERIN (NITROSTAT) 0.4 MG SL tablet Place 1 tablet (0.4 mg total) under the tongue every 5 (five) minutes x 3 doses as needed for chest pain. 01/08/14  Yes Rinaldo Cloud, MD  ondansetron (ZOFRAN) 8 MG tablet Take 8 mg by mouth 2 (two) times daily as needed. 06/10/23  Yes [provider]  pantoprazole (PROTONIX) 40 MG tablet Take 1 tablet (40 mg total) by mouth daily. 04/24/23  Yes Grayce Sessions, NP  rosuvastatin (CRESTOR) 10 MG tablet TAKE ONE TABLET BY MOUTH DAILY AT 5PM 04/13/23  Yes Bensimhon, Bevelyn Buckles, MD  Blood Glucose Monitoring Suppl (ACCU-CHEK GUIDE) w/Device KIT Use to check blood sugar TID. 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 Dx: E11.69 Last reading: 105, 02-20-2023, Fasting. 06/28/22   Hoy Register, MD  glucose blood (ACCU-CHEK GUIDE) test strip USE TO CHECK BLOOD SUGAR 3 TIMES DAILY 04/03/22   Hoy Register, MD   Current Facility-Administered Medications  Medication Dose Route Frequency Provider Last Rate Last Admin   acetaminophen (TYLENOL) tablet 650 mg  650 mg Oral Q6H PRN Synetta Fail, MD       Or   acetaminophen (TYLENOL) suppository 650 mg  650 mg Rectal Q6H PRN Synetta Fail, MD       [START ON 07/04/2023] amiodarone (PACERONE) tablet 200 mg  200 mg Oral Daily Synetta Fail, MD       apixaban Everlene Balls) tablet 5 mg  5 mg Oral BID Synetta Fail, MD       [START ON 07/04/2023] busPIRone (BUSPAR) tablet 5 mg  5 mg Oral BID Synetta Fail, MD       [START ON 07/04/2023] DULoxetine (CYMBALTA) DR capsule 60 mg  60 mg Oral Daily Synetta Fail, MD       metoprolol tartrate (LOPRESSOR) tablet 12.5 mg  12.5 mg Oral BID Synetta Fail, MD       midodrine (PROAMATINE)  tablet 10 mg  10 mg Oral See admin instructions Synetta Fail, MD       [START ON 07/04/2023] pantoprazole (PROTONIX) EC tablet 40 mg  40 mg Oral Daily Synetta Fail, MD       polyethylene glycol (MIRALAX / GLYCOLAX) packet 17 g  17 g Oral Daily PRN Synetta Fail, MD       [START ON 07/04/2023] rosuvastatin (CRESTOR) tablet 10 mg  10 mg Oral q1800 Synetta Fail, MD       sodium chloride flush (NS) 0.9 % injection 3 mL  3 mL Intravenous Q12H Synetta Fail, MD       Labs: Basic Metabolic Panel: Recent Labs  Lab 07/03/23 0700  NA 137  K 4.7  CL 93*  CO2 30  GLUCOSE 129*  BUN 43*  CREATININE 13.25*  CALCIUM 9.8   Liver Function Tests: No results for input(s): "AST", "ALT", "ALKPHOS", "BILITOT", "PROT", "ALBUMIN" in the last 168 hours. No results for input(s): "LIPASE", "AMYLASE" in the last 168 hours. No results for input(s): "AMMONIA" in the last 168 hours. CBC: Recent Labs  Lab 07/03/23 0700  WBC 6.1  HGB 10.7*  HCT 33.3*  MCV 96.8  PLT 170   Cardiac Enzymes: No results for input(s): "CKTOTAL", "CKMB", "CKMBINDEX", "TROPONINI" in the last 168 hours. CBG: Recent Labs  Lab 07/03/23 1701  GLUCAP 87   Iron Studies: No results for input(s): "IRON", "TIBC", "TRANSFERRIN", "FERRITIN" in the last 72 hours. Studies/Results: DG Chest Port 1 View  Result Date: 07/03/2023 CLINICAL DATA:  Chest pain. EXAM: PORTABLE CHEST 1 VIEW COMPARISON:  06/18/2023. FINDINGS: Low lung volume. There is mild pulmonary vascular congestion, likely accentuated by low lung volume. No frank pulmonary edema. Elevated right hemidiaphragm. Bilateral lung fields are otherwise clear. No acute consolidation or lung collapse. Bilateral lateral costophrenic angles are clear. Stable mildly enlarged cardio-mediastinal silhouette. There are surgical staples along the heart border and sternotomy wires, status post CABG (coronary artery bypass graft). There  is a left atrial appendage  closure device, unchanged. No acute osseous abnormalities. The soft tissues are within normal limits. IMPRESSION: *No acute cardiopulmonary abnormality. Electronically Signed   By: Jules Schick M.D.   On: 07/03/2023 08:13    ROS: Pertinent items are noted in HPI. Physical Exam: Vitals:   07/03/23 1515 07/03/23 1530 07/03/23 1655 07/03/23 1831  BP: (!) 115/90 120/79 108/75 108/75  Pulse: 88  98 98  Resp: 16 16 (!) 22 20  Temp:   97.6 F (36.4 C) 97.6 F (36.4 C)  TempSrc:   Oral Oral  SpO2: 95%  98%   Weight:   98.9 kg 98.9 kg  Height:   5\' 7"  (1.702 m) 5\' 7"  (1.702 m)      Weight change:   Intake/Output Summary (Last 24 hours) at 07/03/2023 1843 Last data filed at 07/03/2023 1800 Gross per 24 hour  Intake 717 ml  Output --  Net 717 ml   BP 108/75   Pulse 98   Temp 97.6 F (36.4 C) (Oral)   Resp 20   Ht 5\' 7"  (1.702 m)   Wt 98.9 kg   SpO2 98%   BMI 34.15 kg/m  General appearance: alert, cooperative, fatigued, and no distress Head: Normocephalic, without obvious abnormality, atraumatic Resp: clear to auscultation bilaterally Cardio: regular rate and rhythm, S1, S2 normal, no murmur, click, rub or gallop GI: soft, non-tender; bowel sounds normal; no masses,  no organomegaly Extremities: extremities normal, atraumatic, no cyanosis or edema and LUE AVF +T/B Dialysis Access:  OP HD: SW TTS  4h  400/600   125.2kg  3K/2.5Ca bath AVF   Heparin 2500 - last OP HD 8/06, post wt 125kg   - last 4 sessions coming off 2-3kg over - hectorol 2 mcg IV three times per week - venofer 100mg  IV three times per week (thourgh 07/10/23) - mircera 75 mcg IV q 2 wks, last 8/06, due 8/20  Assessment/Plan:  N/V/Abdominal pain - unclear etiology.  Workup per primary svc and symptom management.  Chest pain radiating to left shoulder - ECG without ischemic changes, prolonged QT.  Troponin I 6-7.  Per primary.  ESRD -   will plan for HD tomorrow morning due to high HD census.  Pt is without  volume overload or dangerous electrolyte abnormalities.  Hypertension/volume  -  plan for HD tomorrow, she is below edw.  Will need to weight in HD to verify.  Anemia  - stable  Metabolic bone disease -   continue with home meds.  Nutrition - renal diet, carb modified.   DM type 2 - stable.  Plan per primary   Chronic combined systolic and diastolic CHF  - appears euvolemic.  Irena Cords, MD [facility] 07/03/2023, 6:43 PM

## 2023-07-03 NOTE — Progress Notes (Signed)
   07/03/23 2022  BiPAP/CPAP/SIPAP  BiPAP/CPAP/SIPAP Pt Type Adult  Reason BIPAP/CPAP not in use Non-compliant

## 2023-07-04 DIAGNOSIS — R9431 Abnormal electrocardiogram [ECG] [EKG]: Secondary | ICD-10-CM | POA: Diagnosis not present

## 2023-07-04 DIAGNOSIS — N186 End stage renal disease: Secondary | ICD-10-CM | POA: Diagnosis not present

## 2023-07-04 DIAGNOSIS — R112 Nausea with vomiting, unspecified: Secondary | ICD-10-CM

## 2023-07-04 DIAGNOSIS — K3184 Gastroparesis: Secondary | ICD-10-CM | POA: Diagnosis not present

## 2023-07-04 DIAGNOSIS — R0600 Dyspnea, unspecified: Secondary | ICD-10-CM | POA: Diagnosis not present

## 2023-07-04 DIAGNOSIS — Z992 Dependence on renal dialysis: Secondary | ICD-10-CM | POA: Diagnosis not present

## 2023-07-04 DIAGNOSIS — E1122 Type 2 diabetes mellitus with diabetic chronic kidney disease: Secondary | ICD-10-CM | POA: Diagnosis not present

## 2023-07-04 DIAGNOSIS — K219 Gastro-esophageal reflux disease without esophagitis: Secondary | ICD-10-CM | POA: Diagnosis not present

## 2023-07-04 DIAGNOSIS — R0781 Pleurodynia: Secondary | ICD-10-CM

## 2023-07-04 LAB — CBC
HCT: 32.5 % — ABNORMAL LOW (ref 36.0–46.0)
Hemoglobin: 10.3 g/dL — ABNORMAL LOW (ref 12.0–15.0)
MCH: 30.7 pg (ref 26.0–34.0)
MCHC: 31.7 g/dL (ref 30.0–36.0)
MCV: 97 fL (ref 80.0–100.0)
Platelets: 153 10*3/uL (ref 150–400)
RBC: 3.35 MIL/uL — ABNORMAL LOW (ref 3.87–5.11)
RDW: 16.4 % — ABNORMAL HIGH (ref 11.5–15.5)
WBC: 7.4 10*3/uL (ref 4.0–10.5)
nRBC: 0 % (ref 0.0–0.2)

## 2023-07-04 LAB — COMPREHENSIVE METABOLIC PANEL
ALT: 10 U/L (ref 0–44)
AST: 13 U/L — ABNORMAL LOW (ref 15–41)
Albumin: 3.3 g/dL — ABNORMAL LOW (ref 3.5–5.0)
Alkaline Phosphatase: 76 U/L (ref 38–126)
Anion gap: 16 — ABNORMAL HIGH (ref 5–15)
BUN: 51 mg/dL — ABNORMAL HIGH (ref 8–23)
CO2: 25 mmol/L (ref 22–32)
Calcium: 9.6 mg/dL (ref 8.9–10.3)
Chloride: 94 mmol/L — ABNORMAL LOW (ref 98–111)
Creatinine, Ser: 14.65 mg/dL — ABNORMAL HIGH (ref 0.44–1.00)
GFR, Estimated: 2 mL/min — ABNORMAL LOW (ref >60)
Glucose, Bld: 97 mg/dL (ref 70–99)
Potassium: 4.5 mmol/L (ref 3.5–5.1)
Sodium: 135 mmol/L (ref 135–145)
Total Bilirubin: 0.9 mg/dL (ref <1.2)
Total Protein: 7.2 g/dL (ref 6.5–8.1)

## 2023-07-04 LAB — GLUCOSE, CAPILLARY
Glucose-Capillary: 92 mg/dL (ref 70–99)
Glucose-Capillary: 126 mg/dL — ABNORMAL HIGH (ref 70–99)
Glucose-Capillary: 121 mg/dL — ABNORMAL HIGH (ref 70–99)
Glucose-Capillary: 113 mg/dL — ABNORMAL HIGH (ref 70–99)

## 2023-07-04 MED ORDER — ONDANSETRON HCL 4 MG PO TABS
4.0000 mg | ORAL_TABLET | Freq: Three times a day (TID) | ORAL | Status: DC | PRN
  Administered 2023-07-04 – 2023-07-06 (×5): 4 mg via ORAL
  Filled 2023-07-04 (×5): qty 1

## 2023-07-04 MED ORDER — LORAZEPAM 2 MG/ML IJ SOLN
0.5000 mg | Freq: Four times a day (QID) | INTRAMUSCULAR | Status: DC | PRN
  Administered 2023-07-04 (×2): 0.5 mg via INTRAVENOUS
  Filled 2023-07-04 (×2): qty 1

## 2023-07-04 NOTE — Progress Notes (Signed)
    Durable Medical Equipment  (From admission, onward)           Start     Ordered   07/04/23 1626  For home use only DME Shower stool  Once       Comments: With back. Patient has Congestive Heart Failure and is incapable of standing in the shower for the duration d/t shortness of breath upon exertion and to prevent a fall.   07/04/23 1631

## 2023-07-04 NOTE — Progress Notes (Signed)
PROGRESS NOTE    [name]  EXB:[id number] DOB: [birth year] DOA: 07/03/2023 PCP: Grayce Sessions, NP   Brief Narrative:  [name] is a 67 y.o. 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 atient reports feeling unwell for about a week with noted nausea and vomiting worse with eating, consistent with her history of gastroparesis but worse than her baseline.  Assessment & Plan:   Principal Problem:   Dyspnea Active Problems:   ESRD on dialysis (HCC)   Gastroparesis   GERD (gastroesophageal reflux disease)   OSA on CPAP   Essential hypertension   COPD (chronic obstructive pulmonary disease) (HCC)   Class 3 obesity   Type 2 diabetes mellitus with hyperlipidemia (HCC)   Chest pain   Atrial fibrillation, permanent (HCC)   Chronic diastolic CHF (congestive heart failure) (HCC)   QT prolongation   Anxiety and depression   Chronic deep vein thrombosis (DVT) of other vein of right upper extremity (HCC)   Nausea and vomiting   ESRD on HD Noncompliant with to missed dialysis session -Patient reports missing dialysis on Saturday and again on Tuesday, make-up session today per nephrology -Given patient's peripheral symptoms will likely keep patient here again tomorrow for dialysis pending today's session, will defer to nephrology   Atypical chest pain, unlikely cardiac in nature given missed dialysis and volume overload  -EKG reassuring, troponin negative despite ESRD status -Symptomatic control until dialysis can be completed   Nausea and vomiting Gastroparesis Known history of gastroparesis in the setting of diabetes.   -PO  zofran for now - benzos discontinued -Qtc likely over-exaggerated by EKG auto-read (closer to 510 per my calculations)   QTc prolongation -Known history of QTc prolongation -Qtc reported 530 - closer to 510 (based on QT/(square root of R-R interval) -Zofran low dose q8h - avoid unnecessary QT prolongation medications -Follow electrolytes closely -**Consider transitioning off amiodarone/metoprolol to improve QTc**   Hypertension - Continue home metoprolol   Diabetes - SSI ongoing   Atrial fibrillation - Continue home amiodarone, metoprolol, Eliquis   GERD - Continue home PPI   COPD - Holding albuterol in the setting of QTc prolongation   Chronic diastolic CHF > Last echo with a EF 50-50%, indeterminate diastolic function, normal RV function. 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 - Noted  DVT prophylaxis: apixaban (ELIQUIS) tablet 5 mg  Code Status:   Code Status: Full Code Family Communication: None present  Status is: Observtion  Dispo: The patient is from: Home              Anticipated d/c is to: Home              Anticipated d/c date is: 24-48h              Patient currently NOT medically stable for discharge  Consultants:  Nephrology  Procedures:  None  Antimicrobials:  None   Subjective: No acute issues/events overnight  Objective: Vitals:   07/03/23 1831 07/03/23 2119 07/03/23  2347 07/04/23 0444  BP: 108/75 117/88 112/76 117/73  Pulse: 98 82 90 80  Resp: 20 17 17 17   Temp: 97.6 F (36.4 C) 97.7 F (36.5 C) 97.7 F (36.5 C) 97.7 F (36.5 C)  TempSrc: Oral Oral Oral Oral  SpO2:  96% 100% 100%  Weight: 98.9 kg     Height: 5\' 7"  (1.702 m)       Intake/Output Summary (Last 24 hours) at 07/04/2023 0719 Last data filed at 07/03/2023 2200 Gross per  24 hour  Intake 957 ml  Output 0 ml  Net 957 ml   Filed Weights   07/03/23 0621 07/03/23 1655 07/03/23 1831  Weight: 124.7 kg 98.9 kg 98.9 kg    Examination:  General:  Pleasantly resting in bed, No acute distress. HEENT:  Normocephalic atraumatic.  Sclerae nonicteric, noninjected.  Extraocular movements intact bilaterally. Neck:  Without mass or deformity.  Trachea is midline. Lungs:  Clear to auscultate bilaterally without rhonchi, wheeze, or rales. Heart:  Regular rate and rhythm.  Without murmurs, rubs, or gallops. Abdomen:  Soft, nontender, nondistended.  Without guarding or rebound. Extremities: Without cyanosis, clubbing, edema, or obvious deformity. Skin:  Warm and dry, no erythema.  Data Reviewed: I have personally reviewed following labs and imaging studies  CBC: Recent Labs  Lab 07/03/23 0700 07/04/23 0413  WBC 6.1 7.4  HGB 10.7* 10.3*  HCT 33.3* 32.5*  MCV 96.8 97.0  PLT 170 153   Basic Metabolic Panel: Recent Labs  Lab 07/03/23 0700 07/04/23 0413  NA 137 135  K 4.7 4.5  CL 93* 94*  CO2 30 25  GLUCOSE 129* 97  BUN 43* 51*  CREATININE 13.25* 14.65*  CALCIUM 9.8 9.6   GFR: Estimated Creatinine Clearance: 4.6 mL/min (A) (by C-G formula based on SCr of 14.65 mg/dL (H)).  Liver Function Tests: Recent Labs  Lab 07/04/23 0413  AST 13*  ALT 10  ALKPHOS 76  BILITOT 0.9  PROT 7.2  ALBUMIN 3.3*   CBG: Recent Labs  Lab 07/03/23 1701 07/03/23 2202  GLUCAP 87 135*   Recent Results (from the past 240 hour(s))  MRSA Next Gen by PCR, Nasal     Status: None   Collection Time: 07/03/23  5:58 PM   Specimen: Nasal Mucosa; Nasal Swab  Result Value Ref Range Status   MRSA by PCR Next Gen NOT DETECTED NOT DETECTED Final    Comment: (NOTE) The GeneXpert MRSA Assay (FDA approved for NASAL specimens only), is one component of a comprehensive MRSA colonization surveillance program. It is not intended to diagnose MRSA infection nor to guide or monitor  treatment for MRSA infections. Test performance is not FDA approved in patients less than 69 years old. Performed at [facility], [address] [place], [place] [address]     Radiology Studies: DG Chest Port 1 View  Result Date: 07/03/2023 CLINICAL DATA:  Chest pain. EXAM: PORTABLE CHEST 1 VIEW COMPARISON:  06/18/2023. FINDINGS: Low lung volume. There is mild pulmonary vascular congestion, likely accentuated by low lung volume. No frank pulmonary edema. Elevated right hemidiaphragm. Bilateral lung fields are otherwise clear. No acute consolidation or lung collapse. Bilateral lateral costophrenic angles are clear. Stable mildly enlarged cardio-mediastinal silhouette. There are surgical staples along the heart border and sternotomy wires, status post CABG (coronary artery bypass graft). There is a left atrial appendage closure device, unchanged. No acute osseous abnormalities. The soft tissues are within normal limits. IMPRESSION: *No acute cardiopulmonary abnormality. Electronically Signed  By: Jules Schick M.D.   On: 07/03/2023 08:13    Scheduled Meds:  amiodarone  200 mg Oral Daily   apixaban  5 mg Oral BID   busPIRone  5 mg Oral BID   DULoxetine  60 mg Oral Daily   insulin aspart  0-15 Units Subcutaneous TID WC   insulin aspart  0-5 Units Subcutaneous QHS   metoprolol tartrate  12.5 mg Oral BID   [START ON 07/05/2023] midodrine  10 mg Oral Q T,Th,Sa-HD   pantoprazole  40 mg Oral Daily   rosuvastatin  10 mg Oral q1800   sodium chloride flush  3 mL Intravenous Q12H   Continuous Infusions:   LOS: 0 days   Time spent:  Azucena Fallen, DO [facility]  If 7PM-7AM, please contact night-coverage [email or web address]  07/04/2023, 7:19 AM

## 2023-07-04 NOTE — TOC CM/SW Note (Signed)
Transition of Care [facility]) - Inpatient Brief Assessment   Patient Details  Name: [name] MRN: [id number] Date of Birth: 15-Sep-1955  [facility]) CM/SW Contact:    Tom-Johnson, Hershal Coria, RN Phone Number: 07/04/2023, 2:11 PM   Clinical Narrative:  Patient presented to the ED with Chest Pain radiating to Left side, N/V. Admitted with Dyspnea. On room Air.  Patient has hx of ESRD, on TTS outpatient Dialysis schedule, A-Fib, DVT on Eliquis, CHF OSA on CPAP, S/P Mitral Valve and Tricuspid Valve.   Patient states she missed Saturday's Dialysis session d/t N/V, Nephrology following for inpatient dialysis. Patient's friend, Mariana Kaufman transports her to and from Dialysis.   From home with daughter, has one brother and two supportive children. On Disability. Uses a cane at home.  PCP is Randa Evens, Kinnie Scales, NP and uses [employer] on [employer]. Patient requested for a shower chair, order called in to [employer] and Earna Coder to deliver to patient at bedside.   Patient not Medically ready for discharge.  CM will continue to follow as patient progresses with care towards discharge.             Transition of Care Asessment: Insurance and Status: Insurance coverage has been reviewed Patient has primary care physician: Yes Home environment has been reviewed: Yes Prior level of function:: Modified Independent Prior/Current Home Services: No current home services Social Determinants of Health Reivew: SDOH reviewed no interventions necessary Readmission risk has been reviewed: Yes Transition of care needs: transition of care needs identified, TOC will continue to follow

## 2023-07-04 NOTE — Progress Notes (Signed)
Pt receives out-pt HD at [facility] [place] on TTS. Will assist as needed.   Olivia Canter Renal Navigator [phone]

## 2023-07-04 NOTE — Progress Notes (Signed)
[facility] Progress Note  Subjective: main c/o is N/V after trying to eat or drink , on and off problem, gastroparesis  Vitals:   07/03/23 1831 07/03/23 2119 07/03/23 2347 07/04/23 0444  BP: 108/75 117/88 112/76 117/73  Pulse: 98 82 90 80  Resp: 20 17 17 17   Temp: 97.6 F (36.4 C) 97.7 F (36.5 C) 97.7 F (36.5 C) 97.7 F (36.5 C)  TempSrc: Oral Oral Oral Oral  SpO2:  96% 100% 100%  Weight: 98.9 kg     Height: 5\' 7"  (1.702 m)       Exam:  alert, nad , on room air  no jvd  Chest cta bilat  Cor reg no RG  Abd soft ntnd no ascites   Ext no LE edema   Alert, NF, ox3     OP HD: SW TTS  4h  400/600   125kg  3K/2.5Ca bath AVF   Heparin 2500 - last OP HD 11/07, post wt 124.9kg , getting to dry wt - hectorol 2 mcg  - venofer 100mg  three times per week thru 11/19 - mircera 75 mcg IV q 2, last on 11/05, due 11/19   Assessment/ Plan: N/V/Abdominal pain - workup per primary svc and symptom management. ESRD - will plan for HD today off schedule due to high pt census.   Hypertension/volume  -  plan for HD today, she is below edw.   Anemia  - stable  Metabolic bone disease -   continue with home meds.  Nutrition - renal diet, carb modified.   DM type 2 - stable.  Plan per primary   Chronic combined systolic and diastolic CHF  - appears euvolemic.     Vinson Moselle MD  [facility] 07/04/2023, 12:43 PM  Recent Labs  Lab 07/03/23 0700 07/04/23 0413  HGB 10.7* 10.3*  ALBUMIN  --  3.3*  CALCIUM 9.8 9.6  CREATININE 13.25* 14.65*  K 4.7 4.5   No results for input(s): "IRON", "TIBC", "FERRITIN" in the last 168 hours. Inpatient medications:  amiodarone  200 mg Oral Daily   apixaban  5 mg Oral BID   busPIRone  5 mg Oral BID   DULoxetine  60 mg Oral Daily   insulin aspart  0-15 Units Subcutaneous TID WC   insulin aspart  0-5 Units Subcutaneous QHS   metoprolol tartrate  12.5 mg Oral BID   [START ON 07/05/2023] midodrine  10 mg Oral Q T,Th,Sa-HD   pantoprazole   40 mg Oral Daily   rosuvastatin  10 mg Oral q1800   sodium chloride flush  3 mL Intravenous Q12H    acetaminophen **OR** acetaminophen, LORazepam, polyethylene glycol

## 2023-07-04 NOTE — Care Management Obs Status (Signed)
MEDICARE OBSERVATION STATUS NOTIFICATION   Patient Details  Name: [name] MRN: [id number] Date of Birth: [birth year]   Medicare Observation Status Notification Given:  Yes    Tom-Johnson, Hershal Coria, RN 07/04/2023, 1:55 PM

## 2023-07-05 DIAGNOSIS — K3184 Gastroparesis: Secondary | ICD-10-CM | POA: Diagnosis not present

## 2023-07-05 DIAGNOSIS — R0600 Dyspnea, unspecified: Secondary | ICD-10-CM | POA: Diagnosis not present

## 2023-07-05 DIAGNOSIS — N186 End stage renal disease: Secondary | ICD-10-CM | POA: Diagnosis not present

## 2023-07-05 DIAGNOSIS — R9431 Abnormal electrocardiogram [ECG] [EKG]: Secondary | ICD-10-CM | POA: Diagnosis not present

## 2023-07-05 DIAGNOSIS — R112 Nausea with vomiting, unspecified: Secondary | ICD-10-CM | POA: Diagnosis not present

## 2023-07-05 LAB — GLUCOSE, CAPILLARY
Glucose-Capillary: 121 mg/dL — ABNORMAL HIGH (ref 70–99)
Glucose-Capillary: 100 mg/dL — ABNORMAL HIGH (ref 70–99)
Glucose-Capillary: 89 mg/dL (ref 70–99)
Glucose-Capillary: 133 mg/dL — ABNORMAL HIGH (ref 70–99)

## 2023-07-05 LAB — HEPATITIS B SURFACE ANTIBODY, QUANTITATIVE: Hep B S AB Quant (Post): 165 m[IU]/mL

## 2023-07-05 NOTE — Plan of Care (Signed)
  Problem: Clinical Measurements: Goal: Respiratory complications will improve Outcome: Progressing Goal: Cardiovascular complication will be avoided Outcome: Progressing   Problem: Nutrition: Goal: Adequate nutrition will be maintained Outcome: Progressing   Problem: Pain Management: Goal: General experience of comfort will improve Outcome: Progressing

## 2023-07-05 NOTE — Progress Notes (Signed)
PROGRESS NOTE    Laresa Cramblit  ZOX:[id number] DOB: 1956-04-04 DOA: 07/03/2023 PCP: Grayce Sessions, NP   Brief Narrative:  [name] is a 67 y.o. 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 atient reports feeling unwell for about a week with noted nausea and vomiting worse with eating, consistent with her history of gastroparesis but worse than her baseline.  Assessment & Plan:   Principal Problem:   Dyspnea Active Problems:   ESRD on dialysis (HCC)   Gastroparesis   GERD (gastroesophageal reflux disease)   OSA on CPAP   Essential hypertension   COPD (chronic obstructive pulmonary disease) (HCC)   Class 3 obesity   Type 2 diabetes mellitus with hyperlipidemia (HCC)   Chest pain   Atrial fibrillation, permanent (HCC)   Chronic diastolic CHF (congestive heart failure) (HCC)   QT prolongation   Anxiety and depression   Chronic deep vein thrombosis (DVT) of other vein of right upper extremity (HCC)   Nausea and vomiting   ESRD on HD Noncompliant with two missed dialysis session -Patient reports missing dialysis on Saturday and again on Tuesday, make-up session wed per nephrology - next session per their expertise (likely Friday) -Given patient's peripheral symptoms will likely keep patient here again tomorrow for dialysis pending today's session, will defer to nephrology   Atypical chest pain, unlikely cardiac in nature given missed dialysis and volume overload  -EKG reassuring, troponin negative despite ESRD status -Symptomatic control until dialysis can be completed   Nausea and vomiting Gastroparesis Known history of  gastroparesis in the setting of diabetes - no recent follow up with GI (Dr Elnoria Howard)   -PO zofran for now - benzos discontinued -Qtc likely over-exaggerated by EKG auto-read (closer to 510 per my calculations)   QTc prolongation -Known history of QTc prolongation -Qtc reported 530 on EKG autoread - closer to 510 given tachycardia (based on QT/(square root of R-R interval) -Zofran low dose q8h - avoid unnecessary QT prolongation medications -Follow electrolytes closely -**Consider transitioning off amiodarone/metoprolol to improve QTc**   Hypertension - Continue home metoprolol   Diabetes - SSI ongoing   Atrial fibrillation - Continue home amiodarone, metoprolol, Eliquis   GERD - Continue home PPI   COPD - Holding albuterol in the setting of QTc prolongation   Chronic diastolic CHF > Last echo with a EF 50-50%, indeterminate diastolic function, normal RV function. 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 - Noted  DVT prophylaxis: apixaban (ELIQUIS) tablet 5 mg  Code Status:   Code Status: Full Code Family Communication: None present  Status is: Observtion  Dispo: The patient is from: Home              Anticipated d/c is to: Home              Anticipated d/c date is: 24h              Patient currently NOT medically stable for discharge  Consultants:  Nephrology  Procedures:  None  Antimicrobials:  None   Subjective: No acute issues/events overnight, N/V ongoing but improving  Objective: Vitals:   07/05/23 0254 07/05/23 0304 07/05/23 0337 07/05/23 0921  BP: 99/68 99/67 106/62 116/73  Pulse: 100 100 100 97  Resp: 19 20 16 18   Temp:  97.7 F (36.5 C) (!) 97.4 F (36.3 C) (!) 97.5 F (36.4 C)  TempSrc:  Oral Oral Oral  SpO2: 100% 100% 95% 100%  Weight:       Height:        Intake/Output Summary (Last 24 hours) at 07/05/2023 1356 Last data filed at 07/05/2023 1047 Gross per 24 hour  Intake 123 ml  Output 3000 ml  Net -2877 ml   Filed Weights   07/03/23 0621 07/03/23 1655 07/03/23 1831  Weight: 124.7 kg 98.9 kg 98.9 kg    Examination:  General:  Pleasantly resting in bed, No acute distress. HEENT:  Normocephalic atraumatic.  Sclerae nonicteric, noninjected.  Extraocular movements intact bilaterally. Neck:  Without mass or deformity.  Trachea is midline. Lungs:  Clear to auscultate bilaterally without rhonchi, wheeze, or rales. Heart:  Regular rate and rhythm.  Without murmurs, rubs, or gallops. Abdomen:  Soft, nontender, nondistended.  Without guarding or rebound. Extremities: Without cyanosis, clubbing, edema, or obvious deformity. Skin:  Warm and dry, no erythema.  Data Reviewed: I have personally reviewed following labs and imaging studies  CBC: Recent Labs  Lab 07/03/23 0700 07/04/23 0413  WBC 6.1 7.4  HGB 10.7* 10.3*  HCT 33.3* 32.5*  MCV 96.8 97.0  PLT 170 153   Basic Metabolic Panel: Recent Labs  Lab 07/03/23 0700 07/04/23 0413  NA 137 135  K 4.7 4.5  CL 93* 94*  CO2 30 25  GLUCOSE 129* 97  BUN 43* 51*  CREATININE 13.25* 14.65*  CALCIUM 9.8 9.6   GFR: Estimated Creatinine Clearance: 4.6 mL/min (A) (by C-G formula based on SCr of 14.65 mg/dL (H)).  Liver Function Tests: Recent Labs  Lab 07/04/23 0413  AST 13*  ALT 10  ALKPHOS 76  BILITOT 0.9  PROT 7.2  ALBUMIN 3.3*   CBG: Recent Labs  Lab 07/04/23 1148 07/04/23 1645 07/04/23 2038 07/05/23 0734 07/05/23 1200  GLUCAP 126* 121* 113* 121* 100*   Recent Results (from the past 240 hour(s))  MRSA Next Gen by PCR, Nasal     Status: None   Collection Time: 07/03/23  5:58 PM   Specimen: Nasal Mucosa; Nasal Swab  Result Value Ref Range Status   MRSA by PCR Next Gen NOT DETECTED NOT DETECTED Final    Comment: (NOTE) The GeneXpert MRSA Assay  (FDA approved for NASAL specimens only), is one component of a comprehensive MRSA colonization surveillance program. It is not intended to diagnose MRSA infection nor to guide or monitor treatment for MRSA infections. Test performance is not FDA approved in patients less than 55 years old. Performed at [facility], [address] [place], [place] [address]     Radiology Studies: No results found.  Scheduled Meds:  amiodarone  200 mg Oral Daily   apixaban  5 mg Oral BID   busPIRone  5 mg Oral BID   DULoxetine  60 mg Oral Daily   insulin aspart  0-15 Units Subcutaneous TID WC   insulin aspart  0-5 Units Subcutaneous QHS   metoprolol tartrate  12.5 mg Oral BID   midodrine  10 mg Oral Q T,Th,Sa-HD   pantoprazole  40 mg Oral Daily   rosuvastatin  10 mg Oral q1800   sodium chloride flush  3 mL Intravenous Q12H   Continuous Infusions:   LOS: 0 days   Time spent:  Azucena Fallen, DO [facility]  If 7PM-7AM, please contact night-coverage [email or web address]  07/05/2023, 1:56 PM

## 2023-07-05 NOTE — Progress Notes (Signed)
[facility] Progress Note   Subjective:   Patient had HD overnight with 3L UF. She reports she is very tired due to overnight HD, and is still having nausea. Denies SOB, CP, dizziness.   Objective Vitals:   07/05/23 0254 07/05/23 0304 07/05/23 0337 07/05/23 0921  BP: 99/68 99/67 106/62 116/73  Pulse: 100 100 100 97  Resp: 19 20 16 18   Temp:  97.7 F (36.5 C) (!) 97.4 F (36.3 C) (!) 97.5 F (36.4 C)  TempSrc:  Oral Oral Oral  SpO2: 100% 100% 95% 100%  Weight:      Height:       Physical Exam General: Alert female in NAD Heart: RRR, no murmurs, rubs or gallops Lungs: CTA bilaterally, respirations unlabored on RA Abdomen: Soft, non-distended, +BS Extremities: No edema b/l lower extremities Dialysis Access: AVF + T/B  Additional Objective Labs: Basic Metabolic Panel: Recent Labs  Lab 07/03/23 0700 07/04/23 0413  NA 137 135  K 4.7 4.5  CL 93* 94*  CO2 30 25  GLUCOSE 129* 97  BUN 43* 51*  CREATININE 13.25* 14.65*  CALCIUM 9.8 9.6   Liver Function Tests: Recent Labs  Lab 07/04/23 0413  AST 13*  ALT 10  ALKPHOS 76  BILITOT 0.9  PROT 7.2  ALBUMIN 3.3*   No results for input(s): "LIPASE", "AMYLASE" in the last 168 hours. CBC: Recent Labs  Lab 07/03/23 0700 07/04/23 0413  WBC 6.1 7.4  HGB 10.7* 10.3*  HCT 33.3* 32.5*  MCV 96.8 97.0  PLT 170 153   Blood Culture    Component Value Date/Time   SDES  09/14/2022 0840    BLOOD RIGHT HAND Performed at [facility], [address] [place], [place] [address]    [facility]  09/14/2022 0840    BOTTLES DRAWN AEROBIC AND ANAEROBIC Blood Culture adequate volume Performed at [facility], [address] [place], [place] [address]    CULT  09/14/2022 0840    NO GROWTH 5 DAYS Performed at [facility], [address], [place], [place] [address]    REPTSTATUS 09/19/2022 FINAL 09/14/2022 0840    CBG: Recent Labs  Lab 07/04/23 0802  07/04/23 1148 07/04/23 1645 07/04/23 2038 07/05/23 0734  GLUCAP 92 126* 121* 113* 121*    Medications:   amiodarone  200 mg Oral Daily   apixaban  5 mg Oral BID   busPIRone  5 mg Oral BID   DULoxetine  60 mg Oral Daily   insulin aspart  0-15 Units Subcutaneous TID WC   insulin aspart  0-5 Units Subcutaneous QHS   metoprolol tartrate  12.5 mg Oral BID   midodrine  10 mg Oral Q T,Th,Sa-HD   pantoprazole  40 mg Oral Daily   rosuvastatin  10 mg Oral q1800   sodium chloride flush  3 mL Intravenous Q12H    Dialysis Orders: SW TTS  4h  400/600   125kg  3K/2.5Ca bath AVF   Heparin 2500 - last OP HD 11/07, post wt 124.9kg , getting to dry wt - hectorol 2 mcg  - venofer 100mg  three times per week thru 11/19 - mircera 75 mcg IV q 2, last on 11/05, due 11/19  Assessment/Plan: N/V/Abdominal pain - workup per primary svc and symptom management. 2. ESRD - Had HD overnight, essentially still on TTS schedule. Will plan for next HD Saturday.   3. Hypertension/volume  -  BP controlled, denies SOB. Will try to get a standing weight here before  she leaves, likely needs a lower EDW at discharge.   4. Anemia  - Hgb 10.3, continue ESA, next dose due 11/19.  5.  Metabolic bone disease -   Calcium slightly elevated, hold VDRA for now. Check phos with next labs  6. Nutrition - renal diet, carb modified.  7.  DM type 2 - stable.  Plan per primary   8. Chronic combined systolic and diastolic CHF  - appears euvolemic. Denies SOB today  Rogers Blocker, PA-C 07/05/2023, 10:47 AM  [facility] Pager: [phone]

## 2023-07-05 NOTE — Progress Notes (Signed)
Addendum to previous note: next HD will be Friday, 07/06/23 since patient missed several HD treatments.

## 2023-07-05 NOTE — Progress Notes (Signed)
Received patient in bed to unit.  Alert and oriented.  Informed consent signed and in chart.   TX duration: 3:00  Patient tolerated well.  Transported back to the room  Alert, without acute distress.  Hand-off given to patient's nurse.   Access used: LUE AVF Access issues: None  Total UF removed: 3000 mL Medication(s) given: None Post HD VS: please data insert    07/05/23 0304  Vitals  Temp 97.7 F (36.5 C)  Temp Source Oral  BP 99/67  MAP (mmHg) 77  BP Location Right Arm  BP Method Automatic  Patient Position (if appropriate) Lying  Pulse Rate 100  Pulse Rate Source Monitor  ECG Heart Rate (!) 105  Resp 20  Oxygen Therapy  SpO2 100 %  O2 Device Room Air  Patient Activity (if Appropriate) In bed  Pulse Oximetry Type Continuous  Post Treatment  Dialyzer Clearance Lightly streaked  Hemodialysis Intake (mL) 0 mL  Liters Processed 72  Fluid Removed (mL) 3000 mL  Tolerated HD Treatment Yes  Post-Hemodialysis Comments Treatment completed and blood returned without issue.  AVG/AVF Arterial Site Held (minutes) 5 minutes (Gauze applied and secured with paper tape; hemostasis achieved.)  AVG/AVF Venous Site Held (minutes) 5 minutes (Gauze applied and secured with paper tape; hemostasis achieved.)  Note  Patient Observations Patient alert, no c/o voiced, no acute distress noted; patient condition stable for return transport.  Fistula / Graft Left Upper arm  No placement date or time found.   Placed prior to admission: Yes  Orientation: Left  Access Location: Upper arm  Site Condition No complications  Fistula / Graft Assessment Present;Thrill;Bruit  Status Flushed;Patent;Deaccessed  Drainage Description None      [name]

## 2023-07-06 ENCOUNTER — Ambulatory Visit (INDEPENDENT_AMBULATORY_CARE_PROVIDER_SITE_OTHER): Payer: 59 | Admitting: Primary Care

## 2023-07-06 DIAGNOSIS — R0600 Dyspnea, unspecified: Secondary | ICD-10-CM | POA: Diagnosis not present

## 2023-07-06 DIAGNOSIS — K3184 Gastroparesis: Secondary | ICD-10-CM | POA: Diagnosis not present

## 2023-07-06 DIAGNOSIS — R9431 Abnormal electrocardiogram [ECG] [EKG]: Secondary | ICD-10-CM | POA: Diagnosis not present

## 2023-07-06 DIAGNOSIS — K219 Gastro-esophageal reflux disease without esophagitis: Secondary | ICD-10-CM | POA: Diagnosis not present

## 2023-07-06 DIAGNOSIS — N186 End stage renal disease: Secondary | ICD-10-CM | POA: Diagnosis not present

## 2023-07-06 LAB — RENAL FUNCTION PANEL
Albumin: 3.4 g/dL — ABNORMAL LOW (ref 3.5–5.0)
Anion gap: 15 (ref 5–15)
BUN: 47 mg/dL — ABNORMAL HIGH (ref 8–23)
CO2: 24 mmol/L (ref 22–32)
Calcium: 9.4 mg/dL (ref 8.9–10.3)
Chloride: 91 mmol/L — ABNORMAL LOW (ref 98–111)
Creatinine, Ser: 10.82 mg/dL — ABNORMAL HIGH (ref 0.44–1.00)
GFR, Estimated: 4 mL/min — ABNORMAL LOW (ref >60)
Glucose, Bld: 111 mg/dL — ABNORMAL HIGH (ref 70–99)
Phosphorus: 6.9 mg/dL — ABNORMAL HIGH (ref 2.5–4.6)
Potassium: 4.3 mmol/L (ref 3.5–5.1)
Sodium: 130 mmol/L — ABNORMAL LOW (ref 135–145)

## 2023-07-06 LAB — CBC
HCT: 31.3 % — ABNORMAL LOW (ref 36.0–46.0)
Hemoglobin: 10.2 g/dL — ABNORMAL LOW (ref 12.0–15.0)
MCH: 31.1 pg (ref 26.0–34.0)
MCHC: 32.6 g/dL (ref 30.0–36.0)
MCV: 95.4 fL (ref 80.0–100.0)
Platelets: 151 10*3/uL (ref 150–400)
RBC: 3.28 MIL/uL — ABNORMAL LOW (ref 3.87–5.11)
RDW: 16.3 % — ABNORMAL HIGH (ref 11.5–15.5)
WBC: 6.6 10*3/uL (ref 4.0–10.5)
nRBC: 0 % (ref 0.0–0.2)

## 2023-07-06 LAB — GLUCOSE, CAPILLARY
Glucose-Capillary: 85 mg/dL (ref 70–99)
Glucose-Capillary: 79 mg/dL (ref 70–99)
Glucose-Capillary: 122 mg/dL — ABNORMAL HIGH (ref 70–99)

## 2023-07-06 MED ORDER — LIDOCAINE HCL (PF) 1 % IJ SOLN: 5.0000 mL | INTRAMUSCULAR | Status: DC | PRN

## 2023-07-06 MED ORDER — PENTAFLUOROPROP-TETRAFLUOROETH EX AERO
1.0000 | INHALATION_SPRAY | CUTANEOUS | Status: DC | PRN
Start: 2023-07-06 — End: 2023-07-06

## 2023-07-06 MED ORDER — HEPARIN SODIUM (PORCINE) 1000 UNIT/ML DIALYSIS
1000.0000 [IU] | INTRAMUSCULAR | Status: DC | PRN
Start: 2023-07-06 — End: 2023-07-06

## 2023-07-06 MED ORDER — NEPRO/CARBSTEADY PO LIQD
237.0000 mL | ORAL | Status: DC | PRN
Start: 2023-07-06 — End: 2023-07-06

## 2023-07-06 MED ORDER — ANTICOAGULANT SODIUM CITRATE 4% (200MG/5ML) IV SOLN
5.0000 mL | Status: DC | PRN
Start: 2023-07-06 — End: 2023-07-06

## 2023-07-06 MED ORDER — ALTEPLASE 2 MG IJ SOLR: 2.0000 mg | Freq: Once | INTRAMUSCULAR | Status: DC | PRN

## 2023-07-06 MED ORDER — LIDOCAINE-PRILOCAINE 2.5-2.5 % EX CREA
1.0000 | TOPICAL_CREAM | CUTANEOUS | Status: DC | PRN
Start: 2023-07-06 — End: 2023-07-06

## 2023-07-06 NOTE — Progress Notes (Addendum)
Subjective:  feels better , No sob,Hd today  2/2 2txs missed, and told her HD tomor on schedule , told me tolerated some brk . Before hd this am , so far tolerating Uf on hd   Objective Vital signs in last 24 hours: Vitals:   07/05/23 1805 07/05/23 2051 07/06/23 0743 07/06/23 0756  BP: 97/63 102/68 98/64 104/83  Pulse: 86 79 87 84  Resp: 19 20 18 18   Temp: 98 F (36.7 C) 98.2 F (36.8 C) 98.3 F (36.8 C)   TempSrc: Oral Oral    SpO2: (!) 89% 100% 95% 95%  Weight:   127.2 kg   Height:       Weight change:   Physical Exam: General: alert , obese female, NAD Heart: RRR, no mrg Lungs: CTA nonlabored  RA Abdomen: Obese, NABS, Soft ,NT, ND  Extremities: Trace  pedal edema  \Dialysis Access: LUA AVF + bruit   Dialysis Orders: SW TTS  4h  400/600   125kg  3K/2.5Ca bath AVF   Heparin 2500 - last OP HD 11/07, post wt 124.9kg , getting to dry wt - hectorol 2 mcg  - venofer 100mg  three times per week thru 11/19 - mircera 75 mcg IV q 2, last on 11/05, due 11/19   Problem/Plan: N/V/Abdominal pain - workup per primary svc and symptom management. Chronic combined systolic and diastolic CHF  - missed hd x2 , min vol ^ today  told needs stand wt/ Denies SOB today 3. ESRD - HD  on admit overnight, essentially still on TTS schedule. Hd  today  for missed  2 txs this wk and HD Saturday on schedule   4 Hypertension/volume  -  BP controlled, denies SOB. On Midodrine ,Will try to get a standing weight here before she leaves, likely needs a lower EDW at discharge.  5.HO A Fib= on eliquis / met < NS on exam   6 Anemia  - Hgb 10.2 continue ESA, next dose due 11/19.  7  Metabolic bone disease -   Corec.  Calcium slightly elevated hold VDRA for now. ,phos 6.9   in past Vel phoro binder but gi distress , not on binder for now will fu  phos trend as op.  8. Nutrition - renal diet, carb modified.  9  DM type 2 - stable.  Plan per primary   Lenny Pastel, PA-C [facility]  [phone] 07/06/2023,8:21 AM  LOS: 0 days   Labs: Basic Metabolic Panel: Recent Labs  Lab 07/03/23 0700 07/04/23 0413  NA 137 135  K 4.7 4.5  CL 93* 94*  CO2 30 25  GLUCOSE 129* 97  BUN 43* 51*  CREATININE 13.25* 14.65*  CALCIUM 9.8 9.6   Liver Function Tests: Recent Labs  Lab 07/04/23 0413  AST 13*  ALT 10  ALKPHOS 76  BILITOT 0.9  PROT 7.2  ALBUMIN 3.3*   No results for input(s): "LIPASE", "AMYLASE" in the last 168 hours. No results for input(s): "AMMONIA" in the last 168 hours. CBC: Recent Labs  Lab 07/03/23 0700 07/04/23 0413 07/06/23 0754  WBC 6.1 7.4 6.6  HGB 10.7* 10.3* 10.2*  HCT 33.3* 32.5* 31.3*  MCV 96.8 97.0 95.4  PLT 170 153 151   Cardiac Enzymes: No results for input(s): "CKTOTAL", "CKMB", "CKMBINDEX", "TROPONINI" in the last 168 hours. CBG: Recent Labs  Lab 07/05/23 0734 07/05/23 1200 07/05/23 1648 07/05/23 2053 07/06/23 0709  GLUCAP 121* 100* 89 133* 85    Studies/Results: No results  found. Medications:  anticoagulant sodium citrate      amiodarone  200 mg Oral Daily   apixaban  5 mg Oral BID   busPIRone  5 mg Oral BID   DULoxetine  60 mg Oral Daily   insulin aspart  0-15 Units Subcutaneous TID WC   insulin aspart  0-5 Units Subcutaneous QHS   metoprolol tartrate  12.5 mg Oral BID   midodrine  10 mg Oral Q T,Th,Sa-HD   pantoprazole  40 mg Oral Daily   rosuvastatin  10 mg Oral q1800   sodium chloride flush  3 mL Intravenous Q12H

## 2023-07-06 NOTE — Discharge Summary (Signed)
Physician Discharge Summary  [name] NWG:[id number] DOB: [birth year] DOA: 07/03/2023  PCP: Grayce Sessions, NP  Admit date: 07/03/2023 Discharge date: 07/06/2023  Admitted From: Home Disposition: Home  Recommendations for Outpatient Follow-up:  Follow up with PCP in 1-2 weeks Follow up with GI as soon as can be scheduled Follow-up with nephrology, continue dialysis as scheduled  Home Health: None Equipment/Devices: No new equipment  Discharge Condition: Stable CODE STATUS: Full Diet recommendation: Low-salt low-fat low-carb renal diet Brief/Interim Summary: [name] is a 67 y.o. 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 atient reports feeling unwell for about a week with noted nausea and vomiting worse with eating, consistent with her history of gastroparesis but worse than her baseline.  Patient admitted as above with worsening epigastric pain nausea and vomiting likely related to her chronic gastroparesis, patient symptoms were limiting her ability to transfer to dialysis last week and she ultimately missed multiple days with worsening volume status and ultimately atypical chest pain which provoked her to present to the ED.  At this time after 2 sessions of dialysis in the hospital patient's volume status and symptoms appear to have resolved she is otherwise stable and agreeable for discharge home.  Recommend close follow-up with GI given her ongoing gastroparesis symptoms with very little control.  Will discharge patient on medications as outlined below including nausea medications in hopes she can follow-up with GI for further  evaluation and treatment per their schedule.   Discharge Diagnoses:  Principal Problem:   Dyspnea Active Problems:   ESRD on dialysis (HCC)   Gastroparesis   GERD (gastroesophageal reflux disease)   OSA on CPAP   Essential hypertension   COPD (chronic obstructive pulmonary disease) (HCC)   Class 3 obesity   Type 2 diabetes mellitus with hyperlipidemia (HCC)   Chest pain   Atrial fibrillation, permanent (HCC)   Chronic diastolic CHF (congestive heart failure) (HCC)   QT prolongation   Anxiety and depression   Chronic deep vein thrombosis (DVT) of other vein of right upper extremity (HCC)   Nausea and vomiting  ESRD on HD Noncompliant with two missed dialysis session -Patient reports missing dialysis on Saturday and again on Tuesday, make-up session wed per nephrology - next session per their expertise (likely Friday)   Atypical chest pain, unlikely cardiac in nature given missed dialysis and volume overload  -EKG reassuring, troponin negative despite ESRD status -Symptoms resolved postdialysis   Nausea and vomiting Gastroparesis, uncontrolled Known history of gastroparesis in the setting of diabetes - no recent follow up with GI (Dr Elnoria Howard)   -PO zofran for now   QTc prolongation -Known history of QTc prolongation -Follow electrolytes closely -**Consider transitioning off amiodarone/metoprolol to improve QTc**   Hypertension - Continue home metoprolol   Diabetes - SSI ongoing   Atrial fibrillation - Continue home amiodarone, metoprolol, Eliquis   GERD - Continue home PPI   COPD - Holding albuterol in the setting of QTc prolongation   Chronic diastolic CHF > Last echo with a EF 50-50%, indeterminate diastolic function, normal RV function. 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 Dx: E11.69 What changed: additional instructions   acetaminophen 325 MG tablet Commonly known as: TYLENOL Take 2 tablets (650 mg total) by mouth every 6 (six) hours as needed for mild pain or moderate pain.   albuterol 108 (90 Base) MCG/ACT inhaler Commonly known as: Proventil HFA Inhale 1-2 puffs into the lungs every 6 (six) hours as needed for wheezing or shortness of breath.   amiodarone 200 MG tablet Commonly known as: PACERONE TAKE 1 TABLET BY MOUTH EVERY DAY   apixaban 5 MG Tabs tablet Commonly known as: Eliquis Take 1 tablet (5 mg total) by mouth 2 (two) times daily.   busPIRone 5 MG tablet Commonly known as: BUSPAR TAKE ONE TABLET BY MOUTH TWICE DAILY @ 9AM & 5PM   colchicine 0.6 MG tablet Take 1/2 (one-half) tablet by mouth once daily   DULoxetine 60 MG capsule Commonly known as: CYMBALTA TAKE ONE CAPSULE (60MG  TOTAL) BY MOUTH DAILY AT 9AM   EPINEPHrine 0.3 mg/0.3 mL Soaj injection Commonly known as: EPI-PEN Inject 0.3 mg into the muscle as needed for anaphylaxis.   insulin lispro 100 UNIT/ML KwikPen Commonly known as: HumaLOG KwikPen Inject 10 Units into the skin 3 (three) times daily. What changed:  how much to take additional instructions   lidocaine-prilocaine cream Commonly known as: EMLA Apply 1 Application topically Every Tuesday,Thursday,and Saturday with dialysis.   loratadine 10 MG tablet Commonly known as: CLARITIN TAKE ONE  TABLET BY MOUTH DAILY AT 9AM   meclizine 25 MG tablet Commonly known as: ANTIVERT Take 1 tablet (25 mg total) by mouth 3 (three) times daily as needed for dizziness.   metoprolol tartrate 25 MG tablet Commonly known as: LOPRESSOR Take 0.5 tablets (12.5 mg total) by mouth 2 (two) times daily.   midodrine 10 MG tablet Commonly known as: PROAMATINE Take 1 tablet (10 mg total) by mouth See admin instructions. Take 10mg  (1 tablet) by mouth on Tuesday's, Thursday's, and Saturday's.   MULTIVITAMIN WOMEN PO Take 1 tablet by mouth daily.   nitroGLYCERIN 0.4 MG SL tablet Commonly known as: NITROSTAT Place 1 tablet (0.4 mg total) under the tongue every 5 (five) minutes x 3 doses as needed for chest pain.   ondansetron 8 MG tablet Commonly known as: ZOFRAN Take 8 mg by mouth 2 (two) times daily as needed.   pantoprazole 40 MG tablet Commonly known as: PROTONIX Take 1 tablet (40 mg total) by mouth daily.   rosuvastatin 10 MG tablet Commonly known as: CRESTOR TAKE ONE TABLET BY MOUTH DAILY AT 5PM   Trulicity 1.5 MG/0.5ML Soaj Generic drug: Dulaglutide Inject 1.5 mg into the skin once a week.               Durable Medical Equipment  (From admission, onward)  Start     Ordered   07/04/23 1626  For home use only DME Shower stool  Once       Comments: With back. Patient has Congestive Heart Failure and is incapable of standing in the shower for the duration d/t shortness of breath upon exertion and to prevent a fall.   07/04/23 1631            Allergies  Allergen Reactions   Bee Pollen Anaphylaxis and Other (See Comments)    UNCONFIRMED, but IS allergic to bee VENOM   Bee Venom Anaphylaxis   Sulfa Antibiotics Itching   Chlorhexidine    Iodinated Contrast Media Nausea And Vomiting    Consultations: Nephrology  Procedures/Studies: DG Chest Port 1 View  Result Date: 07/03/2023 CLINICAL DATA:  Chest pain. EXAM: PORTABLE CHEST 1 VIEW COMPARISON:   06/18/2023. FINDINGS: Low lung volume. There is mild pulmonary vascular congestion, likely accentuated by low lung volume. No frank pulmonary edema. Elevated right hemidiaphragm. Bilateral lung fields are otherwise clear. No acute consolidation or lung collapse. Bilateral lateral costophrenic angles are clear. Stable mildly enlarged cardio-mediastinal silhouette. There are surgical staples along the heart border and sternotomy wires, status post CABG (coronary artery bypass graft). There is a left atrial appendage closure device, unchanged. No acute osseous abnormalities. The soft tissues are within normal limits. IMPRESSION: *No acute cardiopulmonary abnormality. Electronically Signed   By: Jules Schick M.D.   On: 07/03/2023 08:13   CT CHEST ABDOMEN PELVIS WO CONTRAST  Result Date: 06/18/2023 CLINICAL DATA:  Polytrauma, blunt.  Fall EXAM: CT CHEST, ABDOMEN AND PELVIS WITHOUT CONTRAST TECHNIQUE: Multidetector CT imaging of the chest, abdomen and pelvis was performed following the standard protocol without IV contrast. RADIATION DOSE REDUCTION: This exam was performed according to the departmental dose-optimization program which includes automated exposure control, adjustment of the mA and/or kV according to patient size and/or use of iterative reconstruction technique. COMPARISON:  CT abdomen pelvis 02/24/2023 FINDINGS: CHEST: Cardiovascular: The thoracic aorta is normal in caliber. The heart is enlarged in size. Left atrial appendage clip. Tricuspid and mitral valve replacement. No significant pericardial effusion. The main pulmonary artery measures at the upper limits of normal. Lungs/Pleura: No focal consolidation. No pulmonary nodule. No pulmonary mass. No pulmonary contusion or laceration. No pneumatocele formation. No pleural effusion. No pneumothorax. No hemothorax. Mediastinum/Nodes: No pneumomediastinum. The central airways are patent. The esophagus is unremarkable. The thyroid is unremarkable.  Limited evaluation for hilar lymphadenopathy on this noncontrast study. No mediastinal or axillary lymphadenopathy. Musculoskeletal/Chest wall No chest wall mass. Sternotomy wires are intact. No acute rib or sternal fracture. Please see separately dictated CT thoracolumbar spine 06/18/2023. ABDOMEN / PELVIS: Hepatobiliary: Not enlarged. Question slight nodularity of the hepatic contour. No focal lesion. Status post cholecystectomy. No biliary ductal dilatation. Pancreas: Normal pancreatic contour. No main pancreatic duct dilatation. Spleen: Not enlarged. No focal lesion. Adrenals/Urinary Tract: No nodularity bilaterally. No hydroureteronephrosis. No nephroureterolithiasis. No contour deforming renal mass. The urinary bladder is unremarkable. Stomach/Bowel: No small or large bowel wall thickening or dilatation. The appendix is unremarkable. Vasculature/Lymphatic: Mild atherosclerotic plaque no abdominal aorta or iliac aneurysm. No abdominal, pelvic, inguinal lymphadenopathy. Reproductive: Status post hysterectomy. Bilateral adnexal regions are unremarkable. Other: No simple free fluid ascites. No pneumoperitoneum. No mesenteric hematoma identified. No organized fluid collection. Musculoskeletal: No significant soft tissue hematoma. No acute pelvic fracture. Please see separately dictated CT thoracolumbar spine 06/18/2023. Ports and Devices: None. IMPRESSION: 1. No acute intrathoracic, intra-abdominal, intrapelvic traumatic injury with limited evaluation  on this noncontrast study. 2. Please see separately dictated CT thoracolumbar spine 06/18/2023. 3. Other imaging findings of potential clinical significance: Cardiomegaly. Question cirrhosis. Aortic Atherosclerosis (ICD10-I70.0). Electronically Signed   By: Tish Frederickson M.D.   On: 06/18/2023 21:47   CT T-SPINE NO CHARGE  Addendum Date: 06/18/2023   ADDENDUM REPORT: 06/18/2023 21:41 ADDENDUM: Please note findings and impression should mention superior endplate  T11 fracture not T12. Electronically Signed   By: Tish Frederickson M.D.   On: 06/18/2023 21:41   Result Date: 06/18/2023 CLINICAL DATA:  Fall. EXAM: CT THORACIC AND LUMBAR SPINE WITHOUT CONTRAST TECHNIQUE: Multidetector CT imaging of the thoracic and lumbar spine was performed without contrast. Multiplanar CT image reconstructions were also generated. RADIATION DOSE REDUCTION: This exam was performed according to the departmental dose-optimization program which includes automated exposure control, adjustment of the mA and/or kV according to patient size and/or use of iterative reconstruction technique. COMPARISON:  None Available. FINDINGS: CT THORACIC SPINE FINDINGS Alignment: Normal. Vertebrae: Multilevel mild degenerative changes of the spine. Age indeterminate mild concavity of the superior endplate of the T12 level. No definite acute distal acute fracture or focal pathologic process. Paraspinal and other soft tissues: Negative. Disc levels: Maintained. CT LUMBAR SPINE FINDINGS Segmentation: 5 lumbar type vertebrae. Alignment: Normal. Vertebrae: No acute fracture or focal pathologic process. Paraspinal and other soft tissues: Negative. Disc levels: Maintained. IMPRESSION: CT THORACIC SPINE IMPRESSION 1. Age-indeterminate mild superior endplate T12 compression fracture. Correlate with tenderness to palpation to evaluate for an acute component. 2. Otherwise no acute displaced fracture or traumatic listhesis of the thoracic spine. CT LUMBAR SPINE IMPRESSION 1. No acute displaced fracture or traumatic listhesis of the lumbar spine. Electronically Signed: By: Tish Frederickson M.D. On: 06/18/2023 21:37   CT L-SPINE NO CHARGE  Addendum Date: 06/18/2023   ADDENDUM REPORT: 06/18/2023 21:41 ADDENDUM: Please note findings and impression should mention superior endplate T11 fracture not T12. Electronically Signed   By: Tish Frederickson M.D.   On: 06/18/2023 21:41   Result Date: 06/18/2023 CLINICAL DATA:  Fall. EXAM:  CT THORACIC AND LUMBAR SPINE WITHOUT CONTRAST TECHNIQUE: Multidetector CT imaging of the thoracic and lumbar spine was performed without contrast. Multiplanar CT image reconstructions were also generated. RADIATION DOSE REDUCTION: This exam was performed according to the departmental dose-optimization program which includes automated exposure control, adjustment of the mA and/or kV according to patient size and/or use of iterative reconstruction technique. COMPARISON:  None Available. FINDINGS: CT THORACIC SPINE FINDINGS Alignment: Normal. Vertebrae: Multilevel mild degenerative changes of the spine. Age indeterminate mild concavity of the superior endplate of the T12 level. No definite acute distal acute fracture or focal pathologic process. Paraspinal and other soft tissues: Negative. Disc levels: Maintained. CT LUMBAR SPINE FINDINGS Segmentation: 5 lumbar type vertebrae. Alignment: Normal. Vertebrae: No acute fracture or focal pathologic process. Paraspinal and other soft tissues: Negative. Disc levels: Maintained. IMPRESSION: CT THORACIC SPINE IMPRESSION 1. Age-indeterminate mild superior endplate T12 compression fracture. Correlate with tenderness to palpation to evaluate for an acute component. 2. Otherwise no acute displaced fracture or traumatic listhesis of the thoracic spine. CT LUMBAR SPINE IMPRESSION 1. No acute displaced fracture or traumatic listhesis of the lumbar spine. Electronically Signed: By: Tish Frederickson M.D. On: 06/18/2023 21:37   DG Pelvis Portable  Result Date: 06/18/2023 CLINICAL DATA:  Trauma EXAM: PORTABLE PELVIS 1-2 VIEWS COMPARISON:  CT abdomen pelvis 06/18/2023 FINDINGS: There is no evidence of pelvic fracture or diastasis. No acute displaced fracture or dislocation of either hips.  No pelvic bone lesions are seen. IMPRESSION: Negative. Electronically Signed   By: Tish Frederickson M.D.   On: 06/18/2023 21:27   DG Chest Port 1 View  Result Date: 06/18/2023 CLINICAL DATA:   Trauma EXAM: PORTABLE CHEST 1 VIEW COMPARISON:  Chest x-ray 01/11/2023, CT chest 06/17/2022 FINDINGS: Stable enlarged cardiac silhouette. The heart and mediastinal contours unchanged. Atrial appendage clip. Mitral valve replacement. Tricuspid valve replacement. Aortic calcification. No focal consolidation. No pulmonary edema. No pleural effusion. No pneumothorax. No acute osseous abnormality. IMPRESSION: 1. No acute cardiopulmonary abnormality. 2. Aortic Atherosclerosis (ICD10-I70.0). Electronically Signed   By: Tish Frederickson M.D.   On: 06/18/2023 21:20   CT HEAD WO CONTRAST  Result Date: 06/18/2023 CLINICAL DATA:  Head trauma, moderate-severe; Polytrauma, blunt Fall. EXAM: CT HEAD WITHOUT CONTRAST CT CERVICAL SPINE WITHOUT CONTRAST TECHNIQUE: Multidetector CT imaging of the head and cervical spine was performed following the standard protocol without intravenous contrast. Multiplanar CT image reconstructions of the cervical spine were also generated. RADIATION DOSE REDUCTION: This exam was performed according to the departmental dose-optimization program which includes automated exposure control, adjustment of the mA and/or kV according to patient size and/or use of iterative reconstruction technique. COMPARISON:  None Available. FINDINGS: CT HEAD FINDINGS Brain: No evidence of large-territorial acute infarction. No parenchymal hemorrhage. No mass lesion. No extra-axial collection. No mass effect or midline shift. No hydrocephalus. Basilar cisterns are patent. Vascular: No hyperdense vessel. Skull: No acute fracture or focal lesion. Sinuses/Orbits: Paranasal sinuses and mastoid air cells are clear. Bilateral lens replacement. The orbits are unremarkable. Other: None. CT CERVICAL SPINE FINDINGS Alignment: Normal. Skull base and vertebrae: No acute fracture. No aggressive appearing focal osseous lesion or focal pathologic process. Soft tissues and spinal canal: No prevertebral fluid or swelling. No visible  canal hematoma. Upper chest: Unremarkable. Other: Atherosclerotic plaque of the aortic arch and its branches. Retropharyngeal course of the carotid arteries within the neck. IMPRESSION: 1. No acute intracranial abnormality. 2. No acute displaced fracture or traumatic listhesis of the cervical spine. Electronically Signed   By: Tish Frederickson M.D.   On: 06/18/2023 21:17   CT CERVICAL SPINE WO CONTRAST  Result Date: 06/18/2023 CLINICAL DATA:  Head trauma, moderate-severe; Polytrauma, blunt Fall. EXAM: CT HEAD WITHOUT CONTRAST CT CERVICAL SPINE WITHOUT CONTRAST TECHNIQUE: Multidetector CT imaging of the head and cervical spine was performed following the standard protocol without intravenous contrast. Multiplanar CT image reconstructions of the cervical spine were also generated. RADIATION DOSE REDUCTION: This exam was performed according to the departmental dose-optimization program which includes automated exposure control, adjustment of the mA and/or kV according to patient size and/or use of iterative reconstruction technique. COMPARISON:  None Available. FINDINGS: CT HEAD FINDINGS Brain: No evidence of large-territorial acute infarction. No parenchymal hemorrhage. No mass lesion. No extra-axial collection. No mass effect or midline shift. No hydrocephalus. Basilar cisterns are patent. Vascular: No hyperdense vessel. Skull: No acute fracture or focal lesion. Sinuses/Orbits: Paranasal sinuses and mastoid air cells are clear. Bilateral lens replacement. The orbits are unremarkable. Other: None. CT CERVICAL SPINE FINDINGS Alignment: Normal. Skull base and vertebrae: No acute fracture. No aggressive appearing focal osseous lesion or focal pathologic process. Soft tissues and spinal canal: No prevertebral fluid or swelling. No visible canal hematoma. Upper chest: Unremarkable. Other: Atherosclerotic plaque of the aortic arch and its branches. Retropharyngeal course of the carotid arteries within the neck.  IMPRESSION: 1. No acute intracranial abnormality. 2. No acute displaced fracture or traumatic listhesis of the cervical spine. Electronically  Signed   By: Tish Frederickson M.D.   On: 06/18/2023 21:17     Subjective: No acute issues or events overnight   Discharge Exam: Vitals:   07/06/23 1135 07/06/23 1219  BP: 116/63 112/66  Pulse: 79 83  Resp: 17 20  Temp:  98 F (36.7 C)  SpO2: 100% 100%   Vitals:   07/06/23 1126 07/06/23 1130 07/06/23 1135 07/06/23 1219  BP: (!) 96/57 (!) 95/54 116/63 112/66  Pulse: 81 84 79 83  Resp: 20 17 17 20   Temp: 98.4 F (36.9 C)   98 F (36.7 C)  TempSrc: Oral     SpO2: 100% 97% 100% 100%  Weight:   126.4 kg   Height:        General: Pt is alert, awake, not in acute distress Cardiovascular: RRR, S1/S2 +, no rubs, no gallops Respiratory: CTA bilaterally, no wheezing, no rhonchi Abdominal: Soft, NT, ND, bowel sounds + Extremities: no edema, no cyanosis    The results of significant diagnostics from this hospitalization (including imaging, microbiology, ancillary and laboratory) are listed below for reference.     Microbiology: Recent Results (from the past 240 hour(s))  MRSA Next Gen by PCR, Nasal     Status: None   Collection Time: 07/03/23  5:58 PM   Specimen: Nasal Mucosa; Nasal Swab  Result Value Ref Range Status   MRSA by PCR Next Gen NOT DETECTED NOT DETECTED Final    Comment: (NOTE) The GeneXpert MRSA Assay (FDA approved for NASAL specimens only), is one component of a comprehensive MRSA colonization surveillance program. It is not intended to diagnose MRSA infection nor to guide or monitor treatment for MRSA infections. Test performance is not FDA approved in patients less than 54 years old. Performed at [facility], [address], [place], [place] [address]      Labs: BNP (last 3 results) Recent Labs    10/23/22 1215 01/11/23 1230 03/14/23 1210  BNP 362.2* 144.1* 152.5*   Basic Metabolic Panel: Recent Labs   Lab 07/03/23 0700 07/04/23 0413 07/06/23 0754  NA 137 135 130*  K 4.7 4.5 4.3  CL 93* 94* 91*  CO2 30 25 24   GLUCOSE 129* 97 111*  BUN 43* 51* 47*  CREATININE 13.25* 14.65* 10.82*  CALCIUM 9.8 9.6 9.4  PHOS  --   --  6.9*   Liver Function Tests: Recent Labs  Lab 07/04/23 0413 07/06/23 0754  AST 13*  --   ALT 10  --   ALKPHOS 76  --   BILITOT 0.9  --   PROT 7.2  --   ALBUMIN 3.3* 3.4*   No results for input(s): "LIPASE", "AMYLASE" in the last 168 hours. No results for input(s): "AMMONIA" in the last 168 hours. CBC: Recent Labs  Lab 07/03/23 0700 07/04/23 0413 07/06/23 0754  WBC 6.1 7.4 6.6  HGB 10.7* 10.3* 10.2*  HCT 33.3* 32.5* 31.3*  MCV 96.8 97.0 95.4  PLT 170 153 151   Cardiac Enzymes: No results for input(s): "CKTOTAL", "CKMB", "CKMBINDEX", "TROPONINI" in the last 168 hours. BNP: Invalid input(s): "POCBNP" CBG: Recent Labs  Lab 07/05/23 1200 07/05/23 1648 07/05/23 2053 07/06/23 0709 07/06/23 1216  GLUCAP 100* 89 133* 85 79   D-Dimer No results for input(s): "DDIMER" in the last 72 hours. Hgb A1c No results for input(s): "HGBA1C" in the last 72 hours. Lipid Profile No results for input(s): "CHOL", "HDL", "LDLCALC", "TRIG", "CHOLHDL", "LDLDIRECT" in the last 72 hours. Thyroid function studies  No results for input(s): "TSH", "T4TOTAL", "T3FREE", "THYROIDAB" in the last 72 hours.  Invalid input(s): "FREET3" Anemia work up No results for input(s): "VITAMINB12", "FOLATE", "FERRITIN", "TIBC", "IRON", "RETICCTPCT" in the last 72 hours. Urinalysis    Component Value Date/Time   COLORURINE AMBER (A) 11/07/2022 1447   APPEARANCEUR CLOUDY (A) 11/07/2022 1447   LABSPEC 1.015 11/07/2022 1447   PHURINE 5.0 11/07/2022 1447   GLUCOSEU NEGATIVE 11/07/2022 1447   HGBUR SMALL (A) 11/07/2022 1447   BILIRUBINUR small (A) 02/21/2023 1748   KETONESUR trace (5) (A) 02/21/2023 1748   KETONESUR NEGATIVE 11/07/2022 1447   PROTEINUR =100 (A) 02/21/2023 1748    PROTEINUR 100 (A) 11/07/2022 1447   UROBILINOGEN 0.2 02/21/2023 1748   UROBILINOGEN 1.0 10/26/2014 0249   NITRITE Negative 02/21/2023 1748   NITRITE NEGATIVE 11/07/2022 1447   LEUKOCYTESUR Negative 02/21/2023 1748   LEUKOCYTESUR TRACE (A) 11/07/2022 1447   Sepsis Labs Recent Labs  Lab 07/03/23 0700 07/04/23 0413 07/06/23 0754  WBC 6.1 7.4 6.6   Microbiology Recent Results (from the past 240 hour(s))  MRSA Next Gen by PCR, Nasal     Status: None   Collection Time: 07/03/23  5:58 PM   Specimen: Nasal Mucosa; Nasal Swab  Result Value Ref Range Status   MRSA by PCR Next Gen NOT DETECTED NOT DETECTED Final    Comment: (NOTE) The GeneXpert MRSA Assay (FDA approved for NASAL specimens only), is one component of a comprehensive MRSA colonization surveillance program. It is not intended to diagnose MRSA infection nor to guide or monitor treatment for MRSA infections. Test performance is not FDA approved in patients less than 57 years old. Performed at [facility] Lab, [address] [place], [place] [address]      Time coordinating discharge: Over 30 minutes  SIGNED:   Azucena Fallen, DO [facility] 07/06/2023, 3:03 PM Pager   If 7PM-7AM, please contact night-coverage [email or web address]

## 2023-07-06 NOTE — TOC Transition Note (Signed)
Transition of Care [facility]) - CM/SW Discharge Note   Patient Details  Name: [name] MRN: [id number] Date of Birth: [birth year]  Transition of Care [facility]) CM/SW Contact:  Tom-Johnson, Hershal Coria, RN Phone Number: 07/06/2023, 3:45 PM   Clinical Narrative:     Patient is scheduled for discharge today.  Hospital f/u and discharge instructions on AVS. Shower Stool ordered from [employer] and delivered to patient at bedside.  Sherilyn Cooter to transport at discharge.  No further TOC needs noted.        Final next level of care: Home/Self Care Barriers to Discharge: Barriers Resolved   Patient Goals and CMS Choice CMS Medicare.gov Compare Post Acute Care list provided to:: Patient Choice offered to / list presented to : Patient  Discharge Placement                  Patient to be transferred to facility by: Friend Name of family member notified: [facility] Additional resources added to the After Visit Summary for                  DME Arranged: Shower stool DME Agency: [employer] Date DME Agency Contacted: 07/04/23 Time DME Agency Contacted: [id number] Representative spoke with at DME Agency: Earna Coder HH Arranged: NA HH Agency: NA        Social Determinants of Health (SDOH) Interventions SDOH Screenings   Food Insecurity: No Food Insecurity (06/04/2023)  Housing: Low Risk  (06/04/2023)  Transportation Needs: No Transportation Needs (06/04/2023)  Utilities: Not At Risk (06/04/2023)  Alcohol Screen: Low Risk  (06/04/2023)  Depression (PHQ2-9): High Risk (06/04/2023)  Financial Resource Strain: Low Risk  (06/04/2023)  Physical Activity: Insufficiently Active (06/04/2023)  Social Connections: Moderately Isolated (06/04/2023)  Stress: No Stress Concern Present (06/04/2023)  Tobacco Use: Low Risk  (07/03/2023)  Health Literacy: Patient Declined (06/04/2023)     Readmission Risk Interventions    03/16/2023    3:57 PM  01/20/2022    5:06 PM 08/31/2021    1:49 PM  Readmission Risk Prevention Plan  Transportation Screening Complete Complete Complete  Medication Review (RN Care Manager) Referral to Pharmacy  Referral to Pharmacy  PCP or Specialist appointment within 3-5 days of discharge Complete Complete Complete  HRI or Home Care Consult Complete  Complete  SW Recovery Care/Counseling Consult Complete Complete Complete  Palliative Care Screening Not Applicable Not Applicable Not Applicable  Skilled Nursing Facility Not Applicable Not Applicable Not Applicable

## 2023-07-06 NOTE — Progress Notes (Cosign Needed)
[facility] Patient Discharge Orders- [facility] CLINIC: af   Patient's name: [name] Admit/DC Dates: 07/03/2023 - 07/06/2023  Discharge Diagnoses: Atypical chest pain, unlikely cardiac in nature given missed dialysis and volume overload    Nausea and vomiting Gastroparesis, uncontrolled QTc prolongation-Known history of QTc prolongation  Aranesp: Given: no   Date and amount of last dose: 11/05  Last Hgb: 10.2 PRBC's Given: no Date/# of units: no ESA dose for discharge: mircera 75 mcg IV q 2 weeks  on 07/10/23 IV Iron dose at discharge: 100mg   q hd thru 07/10/23   Heparin change: no  EDW Change: no New EDW: no   Bath Change: no  Access intervention/Change: no Details:  Hectorol/Calcitriol change: no  Discharge Labs: Calcium9.4 Phosphorus 6.9 ( no op binder)  fu op setting  Albumin 3.4 K+ 4.3  IV Antibiotics: no Details:  On Coumadin?: no Last INR: Next INR: Managed By:   OTHER/APPTS/LAB ORDERS:    D/C Meds to be reconciled by nurse after every discharge.  Completed By:   Reviewed by: MD:______ RN_______

## 2023-07-06 NOTE — Plan of Care (Signed)

## 2023-07-06 NOTE — Progress Notes (Signed)
Received patient in bed to unit.  Alert and oriented.  Informed consent signed and in chart.   TX duration: 3 hours  Patient tolerated well.  Transported back to the room  Alert, without acute distress.  Hand-off given to patient's nurse.   Access used: Left upper arm fistula Access issues: none  Total UF removed: Medication(s) given: none   07/06/23 1126  Vitals  Temp 98.4 F (36.9 C)  BP (!) 96/57  Pulse Rate 81  Resp 20  Oxygen Therapy  SpO2 100 %  O2 Device Room Air  Patient Activity (if Appropriate) In bed  During Treatment Monitoring  HD Safety Checks Performed Yes  Intra-Hemodialysis Comments Tx completed;Tolerated well  Dialysis Fluid Bolus Normal Saline  Bolus Amount (mL) 300 mL  Post Treatment  Dialyzer Clearance Lightly streaked  Liters Processed 72  Fluid Removed (mL) 0.8 mL  Tolerated HD Treatment Yes  AVG/AVF Arterial Site Held (minutes) 10 minutes  AVG/AVF Venous Site Held (minutes) 10 minutes  Fistula / Graft Left Upper arm  No placement date or time found.   Placed prior to admission: Yes  Orientation: Left  Access Location: Upper arm  Status Deaccessed     Stacie Glaze LPN [facility]

## 2023-07-06 NOTE — Progress Notes (Signed)
D/C order noted. Contacted [facility] to advise clinic of pt's d/c today and that pt should resume care tomorrow.   Olivia Canter Renal Navigator [phone]

## 2023-07-06 NOTE — Progress Notes (Signed)
DISCHARGE NOTE HOME Khiley Cardullo to be discharged Home per MD order. Discussed prescriptions and follow up appointments with the patient. Prescriptions given to patient; medication list explained in detail. Patient verbalized understanding.  Skin clean, dry and intact without evidence of skin break down, no evidence of skin tears noted. IV catheter discontinued intact. Site without signs and symptoms of complications. Dressing and pressure applied. Pt denies pain at the site currently. No complaints noted.  Patient free of lines, drains, and wounds.   An After Visit Summary (AVS) was printed and given to the patient. Patient escorted via wheelchair, and discharged home via private auto.  Margarita Grizzle, RN

## 2023-07-08 ENCOUNTER — Telehealth: Payer: Self-pay | Admitting: Nephrology

## 2023-07-08 NOTE — Telephone Encounter (Signed)
Transition of Care - Initial Contact from Inpatient Facility  Date of discharge: 07/06/23 Date of contact: 07/08/23  Method: Phone Spoke to: Patient  Patient contacted to discuss transition of care from recent inpatient hospitalization. Patient was admitted to [facility] from .11/12-11/15/24.Marland Kitchen with discharge diagnosis of .Marland KitchenMarland KitchenAtypical chest pain, unlikely cardiac in nature given missed dialysis and volume overload //Nausea and vomiting Gastroparesis, uncontrolled  The discharge medication list was reviewed. Patient understands the changes and has no concerns.   Patient will return to his/her outpatient HD unit on: tues   she is calling GI Dr Elnoria Howard Tomorrow for FU Apt.  No other concerns at this time.

## 2023-07-09 ENCOUNTER — Telehealth: Payer: Self-pay

## 2023-07-09 ENCOUNTER — Ambulatory Visit (INDEPENDENT_AMBULATORY_CARE_PROVIDER_SITE_OTHER): Payer: 59 | Admitting: Primary Care

## 2023-07-09 NOTE — Transitions of Care (Post Inpatient/ED Visit) (Signed)
   07/09/2023  Name: [name] MRN: [id number] DOB: [birth year]  Today's TOC FU Call Status: Today's TOC FU Call Status:: Unsuccessful Call (1st Attempt) Unsuccessful Call (1st Attempt) Date: 07/09/23  Attempted to reach the patient regarding the most recent Inpatient/ED visit.  Follow Up Plan: Additional outreach attempts will be made to reach the patient to complete the Transitions of Care (Post Inpatient/ED visit) call.   Signature  Robyne Peers, RN

## 2023-07-10 ENCOUNTER — Telehealth: Payer: Self-pay

## 2023-07-10 ENCOUNTER — Other Ambulatory Visit (INDEPENDENT_AMBULATORY_CARE_PROVIDER_SITE_OTHER): Payer: Self-pay | Admitting: Primary Care

## 2023-07-10 MED ORDER — METOCLOPRAMIDE HCL 5 MG PO TABS: 5.0000 mg | ORAL_TABLET | Freq: Three times a day (TID) | ORAL | 0 refills | Status: AC | PRN

## 2023-07-10 NOTE — Transitions of Care (Post Inpatient/ED Visit) (Signed)
   07/10/2023  Name: [name] MRN: [id number] DOB: 09/04/1955  Today's TOC FU Call Status: Today's TOC FU Call Status:: Successful TOC FU Call Completed Unsuccessful Call (1st Attempt) Date: 07/09/23 [facility] FU Call Complete Date: 07/10/23 Patient's Name and Date of Birth confirmed.  Transition Care Management Follow-up Telephone Call Date of Discharge: 07/06/23 Discharge Facility: [facility]) Type of Discharge: Inpatient Admission Primary Inpatient Discharge Diagnosis:: Dyspnea How have you been since you were released from the hospital?: Same Any questions or concerns?: Yes Patient Questions/Concerns:: She said she has been feeling nauseous.  She told the staff at dialysis and they told her to contact PCP and have PCP prescribe something for her. Patient Questions/Concerns Addressed: Notified Provider of Patient Questions/Concerns  Items Reviewed: Did you receive and understand the discharge instructions provided?: Yes Medications obtained,verified, and reconciled?: No Medications Not Reviewed Reasons:: Other: (She was at dialysis at the time of this call and did not have the list of meds with her but she said she has everything and did not have any questions about the med regime.  She needs a glucometer) Any new allergies since your discharge?: No Dietary orders reviewed?: Yes Type of Diet Ordered:: heart healthy, low sodium low fat, low carb, renal Do you have support at home?: Yes Name of Support/Comfort Primary Source: She said she has help at home but did not specify who.  Medications Reviewed Today: Medications Reviewed Today   Medications were not reviewed in this encounter     Home Care and Equipment/Supplies: Were Home Health Services Ordered?: No Any new equipment or medical supplies ordered?: Yes Name of Medical supply agency?: [employer]- shower stool Were you able to get the equipment/medical supplies?: Yes Do you have any questions related to  the use of the equipment/supplies?: No  Functional Questionnaire: Do you need assistance with bathing/showering or dressing?: No Do you need assistance with meal preparation?: No Do you need assistance with eating?: No Do you have difficulty maintaining continence: No Do you need assistance with getting out of bed/getting out of a chair/moving?: No (She attends dialysis T/T/S) Do you have difficulty managing or taking your medications?: No  Follow up appointments reviewed: PCP Follow-up appointment confirmed?: Yes Date of PCP follow-up appointment?: 07/25/23 Follow-up Provider: Gwinda Passe, NP Specialist Hospital Follow-up appointment confirmed?: NA Do you need transportation to your follow-up appointment?: No Do you understand care options if your condition(s) worsen?: Yes-patient verbalized understanding    SIGNATURE Robyne Peers, RN

## 2023-07-10 NOTE — Telephone Encounter (Signed)
Will forward to provider  

## 2023-07-10 NOTE — Telephone Encounter (Signed)
From [facility] call:  She said she has been feeling nauseous. She told the staff at dialysis and they told her to contact PCP and have PCP prescribe something for her.      She needs a glucometer

## 2023-07-12 ENCOUNTER — Telehealth: Payer: Self-pay | Admitting: Cardiology

## 2023-07-12 NOTE — Telephone Encounter (Signed)
Spoke with daughter regarding visit with dialysis today  Per daughter the nephrologist felt needed to be seen by cardiology and should have been while in hospital recently  Nephrologist wants patient seen for QTc prolongation and to discuss amio/metoprolol therapy (daughter read from information given at dialysis) Below from d/c summary   QTc prolongation -Known history of QTc prolongation -Follow electrolytes closely -**Consider transitioning off amiodarone/metoprolol to improve QTc**  Scheduled appointment for patient to see [facility] clinic 11/25 per Dr Cristal Deer

## 2023-07-12 NOTE — Telephone Encounter (Signed)
Pt daughter called in stating pt was at dialysis and her monitor "flatlined". She states they gave her some paperwork with some info on it about the episode she had. I asked her if pt has had any SOB or CP she said a little bit but not currently. Please advise.

## 2023-07-16 ENCOUNTER — Ambulatory Visit (HOSPITAL_COMMUNITY)
Admission: RE | Admit: 2023-07-16 | Discharge: 2023-07-16 | Disposition: A | Discharge status: Home / Self Care | Payer: 59 | Source: Ambulatory Visit | Attending: Internal Medicine | Admitting: Internal Medicine

## 2023-07-16 VITALS — BP 100/62 | HR 113 | Ht 67.0 in | Wt 274.6 lb

## 2023-07-16 DIAGNOSIS — Z79899 Other long term (current) drug therapy: Secondary | ICD-10-CM | POA: Insufficient documentation

## 2023-07-16 DIAGNOSIS — Z5181 Encounter for therapeutic drug level monitoring: Secondary | ICD-10-CM | POA: Insufficient documentation

## 2023-07-16 DIAGNOSIS — E1122 Type 2 diabetes mellitus with diabetic chronic kidney disease: Secondary | ICD-10-CM | POA: Diagnosis not present

## 2023-07-16 DIAGNOSIS — Z7985 Long-term (current) use of injectable non-insulin antidiabetic drugs: Secondary | ICD-10-CM | POA: Diagnosis not present

## 2023-07-16 DIAGNOSIS — I4892 Unspecified atrial flutter: Secondary | ICD-10-CM | POA: Insufficient documentation

## 2023-07-16 DIAGNOSIS — D6859 Other primary thrombophilia: Secondary | ICD-10-CM | POA: Insufficient documentation

## 2023-07-16 DIAGNOSIS — I959 Hypotension, unspecified: Secondary | ICD-10-CM | POA: Insufficient documentation

## 2023-07-16 DIAGNOSIS — N186 End stage renal disease: Secondary | ICD-10-CM | POA: Diagnosis not present

## 2023-07-16 DIAGNOSIS — I132 Hypertensive heart and chronic kidney disease with heart failure and with stage 5 chronic kidney disease, or end stage renal disease: Secondary | ICD-10-CM | POA: Insufficient documentation

## 2023-07-16 DIAGNOSIS — I4811 Longstanding persistent atrial fibrillation: Secondary | ICD-10-CM | POA: Insufficient documentation

## 2023-07-16 DIAGNOSIS — I4819 Other persistent atrial fibrillation: Secondary | ICD-10-CM | POA: Insufficient documentation

## 2023-07-16 DIAGNOSIS — I5042 Chronic combined systolic (congestive) and diastolic (congestive) heart failure: Secondary | ICD-10-CM | POA: Insufficient documentation

## 2023-07-16 NOTE — H&P (View-Only) (Signed)
 Primary Care Physician: Grayce Sessions, NP Referring Physician: Dr Ferman Hamming [name] is a 67 y.o. female with , type 2 DM, morbid obesity, CKD, a a hx of  many  years of longstanding persisitent afib ( ekg's reviewed from all the back to 2011 show persistent  afib) , previoulsy  followed by Dr. [name] until recently, established with  Dr. Cristal Deer, 03/31/20.  She set her up for a cardioversion, she did convert to SR, but she had ERAF possibly after just a few hours. 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 She  did at one time weigh around 500-600 lbs. Her left atrium size is 5.3 indicating severe left atrial enlargement.She  is bothered with fatigue and shortness of breath. She is on pradaxa 150 mg bid. Dr. Cristal Deer wanted me to discuss tikosyn admit with the pt.  She was admitted for [facility] 11/02/20. Unfortunately, Tikosyn did not work to keep her in SR. She was started on amiodarone 200 mg bid and d/c home. She then went on to have  an hospitalization in May 2022,  with acute on chronic HF, wight up 30 lbs and was diuresed. She then went on to  Mitral and Tricuspid Valvular ring annuloplasty, Left and Right sided MAZE procedure, and clipping of her Left Atrial Appendage with Dr. Dorris Fetch.  She tolerated the procedure without difficulty and was taken to the SICU in stable condition.  she was in NSR after surgery. SHe was followed by HF team. Her creatinine peaked at 4.36. She was supported on IV milrinone  for several days after surgery.   She then was hospitalized again 6/11 thru 6/15 with acute exacerbation of CHF. Again diuresed and had cardioversion. She was in SR on discharge.   Admitted again 02/07/21 thru 02/10/21 with substernal chest pain, nausea/vomiting and abdominal pain. She was back in afib with RVR. She again had repeat cardioversion.   She again had admission  02/25/21 with afib with RVR. She was out of amiodarone  x one week. She was started on IV amiodarone and converted. SHe was discharged on 200 mg bid of amiodarone.  EP consulted and wanted to defer treatment for 6 months until repeat echo. At that time there was discussion of PPM with AV nodal ablation.  She  was then seen acutely  by Otilio Saber, PA, for Dr. Johney Frame,  8/25 for being in persistent afib. She was not felt to be a current candidate for PPM with AV nodal ablation as she had just had Maze procedure in May. She was set up for cardioversion 04/19/21.   F/u in Afib clinic, 04/26/21, for successful cardioversion 04/19/21.  She was back in afib with v rate at 106 bpm. Discussion with HF and torsemide was increased.   She saw Dr. Johney Frame on 05/30/21 and was in SR. Plan was to continue amiodarone and eliquis.   Admitted 4/23 with recurrent HR in the setting of recurrent AF. Underwent DCCV and converted to NSR on 11/25/21.   Seen by Dr. Lalla Brothers on 01/17/22 and determined to not be a candidate for catheter ablation and not a good candidate for AV junction ablation and PPM. Continued on amiodarone. Readmitted 5/30-6/5 for acute on chronic CHF.  Underwent DCCV on 01/31/22 for atrial flutter and successfully converted to NSR.   Admitted on 03/08/22 due to acute exacerbation of CHF and due to worsening renal function and progression to ESRD  underwent placement of right IJ catheter for HD on 03/14/22. S/p placement of left brachiocephalic fistula on 03/17/22.   HD schedule T-Th-Sat.  Recent ED visits on 09/03/22 and 09/14/22 for chest pain, nausea, and vomiting. ED EKG on 09/04/22 showed 2:1 atrial flutter.   On follow up today, her primary concern today is nausea, stomach ache, and vomiting. She currently denies chest pain or shortness of breath. She feels like her n/v and stomach issues have been going on for a while now. She voices frustration in ability to perform daily activities secondary to current symptoms. She states prescription given for Velphoro to take  TID; she was advised this medication would help breakdown her food more easily. She states it has not helped. She does endorse previously trying a medication that melted in her mouth helped somewhat for her nausea.   She has been compliant with her amiodarone and eliquis. Denies any bleeding concerns.   On follow up 07/16/23, she is currently in Afib. Recent hospital admission 11/12-15 for epigastric pain, nausea, and vomiting likely secondary to chronic gastroparesis. She was noncompliant with 2 missed dialysis sessions prior to hospital admission. Daughter contacted office on 11/21 noting patient was at dialysis and her monitor "flatlined." She currently takes amiodarone 200 mg daily and metoprolol 12.5 mg BID. She has a history of noted QT prolongation. Previously, Dr. Lalla Brothers has noted patient is not a good candidate for ablation or AV nodal ablation/PPM. No missed doses of Eliquis 5 mg BID.   Today, she denies symptoms of palpitations, chest pain, shortness of breath, orthopnea, PND, lower extremity edema, dizziness, presyncope, syncope, or neurologic sequela. The patient is tolerating medications without difficulties.  Past Medical History:  Diagnosis Date   Acute on chronic systolic CHF (congestive heart failure) (HCC) 12/21/2020   Allergic rhinitis    Anemia    Arthritis    Asthma    Chronic diastolic CHF (congestive heart failure) (HCC)    Chronic kidney disease    Dialysis Tu/TH/Sa   COPD (chronic obstructive pulmonary disease) (HCC)    COVID-19 03/28/2023   Depression    DM (diabetes mellitus) (HCC)    DVT (deep vein thrombosis) in pregnancy    Gastroenteritis 03/27/2023   GERD (gastroesophageal reflux disease)    HTN (hypertension)    Hyperlipidemia    Obesity    Pneumonia 04/2018   RIGHT LOBE   Sleep apnea    needs to be retested - to get new cpap   Past Surgical History:  Procedure Laterality Date   ABDOMINAL HYSTERECTOMY     AV FISTULA PLACEMENT Left 03/17/2022    Procedure: LEFT ARM ARTERIOVENOUS (AV) FISTULA CREATION;  Surgeon: Chuck Hint, MD;  Location: [facility] OR;  Service: Vascular;  Laterality: Left;   BIOPSY  01/12/2023   Procedure: BIOPSY;  Surgeon: Jeani Hawking, MD;  Location: [facility] ENDOSCOPY;  Service: Gastroenterology;;   Thressa Sheller STUDY  11/26/2020   Procedure: BUBBLE STUDY;  Surgeon: Jodelle Red, MD;  Location: [facility] ENDOSCOPY;  Service: Cardiovascular;;   CARDIOVERSION N/A 05/06/2020   Procedure: CARDIOVERSION;  Surgeon: Jodelle Red, MD;  Location: [facility] ENDOSCOPY;  Service: Cardiovascular;  Laterality: N/A;   CARDIOVERSION N/A 11/04/2020   Procedure: CARDIOVERSION;  Surgeon: Lewayne Bunting, MD;  Location: [facility] ENDOSCOPY;  Service: Cardiovascular;  Laterality: N/A;   CARDIOVERSION N/A 02/01/2021   Procedure: CARDIOVERSION;  Surgeon: Dolores Patty, MD;  Location: [facility] ENDOSCOPY;  Service: Cardiovascular;  Laterality: N/A;   CARDIOVERSION N/A 02/09/2021   Procedure: CARDIOVERSION;  Surgeon: Dolores Patty, MD;  Location: [facility] ENDOSCOPY;  Service: Cardiovascular;  Laterality: N/A;   CARDIOVERSION N/A 04/19/2021   Procedure: CARDIOVERSION;  Surgeon: Pricilla Riffle, MD;  Location: [facility] ENDOSCOPY;  Service: Cardiovascular;  Laterality: N/A;   CARDIOVERSION N/A 11/25/2021   Procedure: CARDIOVERSION;  Surgeon: Dolores Patty, MD;  Location: [facility] ENDOSCOPY;  Service: Cardiovascular;  Laterality: N/A;   CARDIOVERSION N/A 01/31/2022   Procedure: CARDIOVERSION;  Surgeon: Dolores Patty, MD;  Location: [facility] ENDOSCOPY;  Service: Cardiovascular;  Laterality: N/A;   CATARACT EXTRACTION, BILATERAL     CHOLECYSTECTOMY     COLONOSCOPY WITH PROPOFOL N/A 03/05/2015   Procedure: COLONOSCOPY WITH PROPOFOL;  Surgeon: Jeani Hawking, MD;  Location: [facility] ENDOSCOPY;  Service: Endoscopy;  Laterality: N/A;   DIAGNOSTIC LAPAROSCOPY     ESOPHAGOGASTRODUODENOSCOPY (EGD) WITH PROPOFOL N/A 01/12/2023   Procedure: ESOPHAGOGASTRODUODENOSCOPY (EGD) WITH  PROPOFOL;  Surgeon: Jeani Hawking, MD;  Location: [facility] ENDOSCOPY;  Service: Gastroenterology;  Laterality: N/A;   FISTULA SUPERFICIALIZATION Left 03/17/2022   Procedure: FISTULA SUPERFICIALIZATION -LEFT;  Surgeon: Chuck Hint, MD;  Location: [facility] OR;  Service: Vascular;  Laterality: Left;   ingrown hallux Left    IR FLUORO GUIDE CV LINE RIGHT  03/14/2022   IR US GUIDE VASC ACCESS RIGHT  03/14/2022   KNEE SURGERY     LEFT HEART CATH AND CORONARY ANGIOGRAPHY N/A 12/31/2017   Procedure: LEFT HEART CATH AND CORONARY ANGIOGRAPHY;  Surgeon: Rinaldo Cloud, MD;  Location: [facility] INVASIVE CV LAB;  Service: Cardiovascular;  Laterality: N/A;   MAZE N/A 12/29/2020   Procedure: MAZE;  Surgeon: Loreli Slot, MD;  Location: [facility] OR;  Service: Open Heart Surgery;  Laterality: N/A;   MITRAL VALVE REPAIR N/A 12/29/2020   Procedure: MITRAL VALVE REPAIR USING CARBOMEDICS ANNULOFLEX RING SIZE 30;  Surgeon: Loreli Slot, MD;  Location: [facility] OR;  Service: Open Heart Surgery;  Laterality: N/A;   REVISON OF ARTERIOVENOUS FISTULA Left 01/05/2023   Procedure: REVISION OF LEFT ARTERIOVENOUS FISTULA WITH INTERPOSITION PTFE GRAFT;  Surgeon: Chuck Hint, MD;  Location: [facility] OR;  Service: Vascular;  Laterality: Left;  ELIQUIS AND HOLD 48 X HOURS   RIGHT HEART CATH N/A 12/22/2020   Procedure: RIGHT HEART CATH;  Surgeon: Laurey Morale, MD;  Location: [facility] INVASIVE CV LAB;  Service: Cardiovascular;  Laterality: N/A;   RIGHT HEART CATH N/A 01/31/2021   Procedure: RIGHT HEART CATH;  Surgeon: Dolores Patty, MD;  Location: [facility] INVASIVE CV LAB;  Service: Cardiovascular;  Laterality: N/A;   RIGHT HEART CATH N/A 07/29/2021   Procedure: RIGHT HEART CATH;  Surgeon: Dolores Patty, MD;  Location: [facility] INVASIVE CV LAB;  Service: Cardiovascular;  Laterality: N/A;   RIGHT HEART CATH N/A 11/24/2021   Procedure: RIGHT HEART CATH;  Surgeon: Dolores Patty, MD;  Location: [facility] INVASIVE CV LAB;  Service: Cardiovascular;   Laterality: N/A;   RIGHT HEART CATH N/A 02/14/2022   Procedure: RIGHT HEART CATH;  Surgeon: Dolores Patty, MD;  Location: [facility] INVASIVE CV LAB;  Service: Cardiovascular;  Laterality: N/A;   RIGHT HEART CATH N/A 02/22/2022   Procedure: RIGHT HEART CATH;  Surgeon: Laurey Morale, MD;  Location: [facility] INVASIVE CV LAB;  Service: Cardiovascular;  Laterality: N/A;   TEE WITHOUT CARDIOVERSION N/A 11/26/2020   Procedure: TRANSESOPHAGEAL ECHOCARDIOGRAM (TEE);  Surgeon: Jodelle Red, MD;  Location: [facility] ENDOSCOPY;  Service: Cardiovascular;  Laterality: N/A;   TEE WITHOUT CARDIOVERSION N/A 12/29/2020   Procedure: TRANSESOPHAGEAL ECHOCARDIOGRAM (TEE);  Surgeon:  Loreli Slot, MD;  Location: [facility] OR;  Service: Open Heart Surgery;  Laterality: N/A;   TEE WITHOUT CARDIOVERSION N/A 01/20/2022   Procedure: TRANSESOPHAGEAL ECHOCARDIOGRAM (TEE);  Surgeon: Dolores Patty, MD;  Location: [facility] ENDOSCOPY;  Service: Cardiovascular;  Laterality: N/A;   TRICUSPID VALVE REPLACEMENT N/A 12/29/2020   Procedure: 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 RING SIZE 34;  Surgeon: Loreli Slot, MD;  Location: [facility] OR;  Service: Open Heart Surgery;  Laterality: N/A;    Current Outpatient Medications  Medication Sig Dispense Refill   acetaminophen (TYLENOL) 325 MG tablet Take 2 tablets (650 mg total) by mouth every 6 (six) hours as needed for mild pain or moderate pain. (Patient taking differently: Take 650 mg by mouth as needed for mild pain (pain score 1-3) or moderate pain (pain score 4-6).)     albuterol (PROVENTIL HFA) 108 (90 Base) MCG/ACT inhaler Inhale 1-2 puffs into the lungs every 6 (six) hours as needed for wheezing or shortness of breath. 18 g 2   amiodarone (PACERONE) 200 MG tablet TAKE 1 TABLET BY MOUTH EVERY DAY 90 tablet 1   apixaban (ELIQUIS) 5 MG TABS tablet Take 1 tablet (5 mg total) by mouth 2 (two) times daily. 180 tablet 1   Blood Glucose Monitoring Suppl (ACCU-CHEK GUIDE) w/Device  KIT Use to check blood sugar TID. Dx: E11.69 (Patient taking differently: Use to check blood sugar TID. Dx: E11.69 Last reading: 105, 02-20-2023, Fasting.) 1 kit 0   busPIRone (BUSPAR) 5 MG tablet TAKE ONE TABLET BY MOUTH TWICE DAILY @ 9AM & 5PM 180 tablet 11   colchicine 0.6 MG tablet Take 1/2 (one-half) tablet by mouth once daily 45 tablet 0   diphenhydrAMINE (BENADRYL) 25 MG tablet Take 25 mg by mouth as needed.     Doxercalciferol (HECTOROL IV) IV given on dialysis days     Dulaglutide (TRULICITY) 1.5 MG/0.5ML SOPN Inject 1.5 mg into the skin once a week. 2 mL 2   DULoxetine (CYMBALTA) 60 MG capsule TAKE ONE CAPSULE (60MG  TOTAL) BY MOUTH DAILY AT 9AM 180 capsule 11   EPINEPHrine 0.3 mg/0.3 mL IJ SOAJ injection Inject 0.3 mg into the muscle as needed for anaphylaxis. 1 each 1   fluticasone (FLONASE ALLERGY RELIEF) 50 MCG/ACT nasal spray Place 2 sprays into both nostrils as needed.     glucose blood (ACCU-CHEK GUIDE) test strip USE TO CHECK BLOOD SUGAR 3 TIMES DAILY 100 strip 3   insulin aspart (NOVOLOG FLEXPEN) 100 UNIT/ML FlexPen Inject into the skin. ON SLIDING SCALE     insulin lispro (HUMALOG KWIKPEN) 100 UNIT/ML KwikPen Inject 10 Units into the skin 3 (three) times daily. (Patient taking differently: Inject 0-5 Units into the skin 3 (three) times daily. Sliding scale) 15 mL 3   lidocaine-prilocaine (EMLA) cream Apply 1 Application topically Every Tuesday,Thursday,and Saturday with dialysis.     linaclotide (LINZESS) 72 MCG capsule Take 72 mcg by mouth as needed.     loratadine (CLARITIN) 10 MG tablet TAKE ONE TABLET BY MOUTH DAILY AT 9AM 30 tablet 11   meclizine (ANTIVERT) 25 MG tablet Take 1 tablet (25 mg total) by mouth 3 (three) times daily as needed for dizziness. (Patient taking differently: Take 25 mg by mouth as needed for dizziness.) 30 tablet 0   metoCLOPramide (REGLAN) 5 MG tablet Take 1 tablet (5 mg total) by mouth every 8 (eight) hours as needed for nausea. 30 tablet 0    metoprolol tartrate (LOPRESSOR) 25 MG tablet Take 0.5  tablets (12.5 mg total) by mouth 2 (two) times daily. 30 tablet 0   midodrine (PROAMATINE) 10 MG tablet Take 1 tablet (10 mg total) by mouth See admin instructions. Take 10mg  (1 tablet) by mouth on Tuesday's, Thursday's, and Saturday's.     Multiple Vitamins-Minerals (MULTIVITAMIN WOMEN PO) Take 1 tablet by mouth daily.     nitroGLYCERIN (NITROSTAT) 0.4 MG SL tablet Place 1 tablet (0.4 mg total) under the tongue every 5 (five) minutes x 3 doses as needed for chest pain. 25 tablet 12   ondansetron (ZOFRAN) 8 MG tablet Take 8 mg by mouth 2 (two) times daily as needed.     pantoprazole (PROTONIX) 40 MG tablet Take 1 tablet (40 mg total) by mouth daily. 90 tablet 0   polyethylene glycol powder (MIRALAX) 17 GM/SCOOP powder Take by mouth as needed.     rosuvastatin (CRESTOR) 10 MG tablet TAKE ONE TABLET BY MOUTH DAILY AT 5PM 90 tablet 3   No current facility-administered medications for this encounter.    Allergies  Allergen Reactions   Bee Pollen Anaphylaxis and Other (See Comments)    UNCONFIRMED, but IS allergic to bee VENOM   Bee Venom Anaphylaxis   Sulfa Antibiotics Itching   Chlorhexidine    Iodinated Contrast Media Nausea And Vomiting   ROS- All systems are reviewed and negative except as per the HPI above  Physical Exam: Vitals:   07/16/23 1146  BP: 100/62  Pulse: (!) 113  Weight: 124.6 kg  Height: 5\' 7"  (1.702 m)    Wt Readings from Last 3 Encounters:  07/16/23 124.6 kg  07/06/23 126.4 kg  06/04/23 124.7 kg    Labs: Lab Results  Component Value Date   NA 130 (L) 07/06/2023   K 4.3 07/06/2023   CL 91 (L) 07/06/2023   CO2 24 07/06/2023   GLUCOSE 111 (H) 07/06/2023   BUN 47 (H) 07/06/2023   CREATININE 10.82 (H) 07/06/2023   CALCIUM 9.4 07/06/2023   PHOS 6.9 (H) 07/06/2023   MG 2.0 03/12/2022   Lab Results  Component Value Date   INR 1.9 (H) 06/18/2023   Lab Results  Component Value Date   CHOL 158  05/03/2021   HDL 50 05/03/2021   LDLCALC 91 05/03/2021   TRIG 83 05/03/2021    GEN- The patient is well appearing, alert and oriented x 3 today.   Neck - no JVD or carotid bruit noted Lungs- Clear to ausculation bilaterally, normal work of breathing Heart- Irregular rate and rhythm, no murmurs, rubs or gallops, PMI not laterally displaced Extremities- no clubbing, cyanosis, or edema Skin - no rash or ecchymosis noted   EKG-  Vent. rate 113 BPM PR interval * ms QRS duration 90 ms QT/QTcB 342/469 ms P-R-T axes * 156 94 Atrial fibrillation with rapid ventricular response with premature ventricular or aberrantly conducted complexes Possible Right ventricular hypertrophy Cannot rule out Inferior infarct , age undetermined Cannot rule out Anterior infarct , age undetermined Abnormal ECG When compared with ECG of 04-Jul-2023 06:49, PREVIOUS ECG IS PRESENT   Assessment and Plan:  1. Persistent atrial fib/flutter  Has had afib for 10+ years  Has failed Tikosyn and amiodarone historically Hx Maze procedure and multiple cardioversions   She is in Afib today. Given history of prolonged QT interval I am hesitant to reload amiodarone with a higher dose and will not plan to do so. I will continue amiodarone without dosage change. Discussed limited options with patient given history of dialysis, which includes  continuing amiodarone 200 mg daily or attempting to proceed with rate control. It is important to note her current amiodarone 200 mg daily is already providing rate control with Lopressor 12.5 mg BID. Her current BP today is 100/60 so I would be hesitant to increase her metoprolol. Rate control without amiodarone would likely be a definitivee challenge given her low BP at baseline. After discussion, she would like to proceed with cardioversion to try to maintain sinus rhythm. She took Trulicity last Wednesday. No missed doses of Eliquis. From the standpoint of our clinic, I do not have other  additional options for rhythm control at this time. If she has ERAF following cardioversion, would likely recommend follow up with EP to confirm long term treatment plan given age, amiodarone, dialysis, and limited options.   Informed Consent   Shared Decision Making/Informed Consent The risks (stroke, cardiac arrhythmias rarely resulting in the need for a temporary or permanent pacemaker, skin irritation or burns and complications associated with conscious sedation including aspiration, arrhythmia, respiratory failure and death), benefits (restoration of normal sinus rhythm) and alternatives of a direct current cardioversion were explained in detail to [name] and she agrees to proceed.      2. Chronic systolic HF She has f/u with Dr. Cristal Deer 08/30/23.   3. ESRD - HD - Tu/Th/Sat schedule Defer fluid mgmt to Nephrology  4. Secondary hypercoagulable state CHADS2VASC score of 4 Continue Eliquis 5 mg BID   Follow up as scheduled with Cardiology after DCCV.   Justin Mend, PA-C Afib [facility] [address] [place], [place] [address] [phone] 10/20/22 10:25 am

## 2023-07-16 NOTE — Patient Instructions (Addendum)
Cardioversion scheduled for: Monday, December 2nd   - Arrive at the [facility] and go to admitting at [facility] not eat or drink anything after midnight the night prior to your procedure.   - Take all your morning medication (except diabetic medications) with a sip of water prior to arrival.  - You will not be able to drive home after your procedure.    - Do NOT miss any doses of your blood thinner - if you should miss a dose please notify our office immediately.   - If you feel as if you go back into normal rhythm prior to scheduled cardioversion, please notify our office immediately.   If your procedure is canceled in the cardioversion suite you will be charged a cancellation fee.    Hold below medications 7 days prior to scheduled procedure/anesthesia.  Restart medication on the normal dosing day after scheduled procedure/anesthesia  Dulaglutide (Trulicity)     For those patients who have a scheduled procedure/anesthesia on the same day of the week as their dose, hold the medication on the day of surgery.  They can take their scheduled dose the week before.  **Patients on the above medications scheduled for elective procedures that have not held the medication for the appropriate amount of time are at risk of cancellation or change in the anesthetic plan.

## 2023-07-16 NOTE — Progress Notes (Signed)
Primary Care Physician: Leslie Sessions, NP Referring Physician: Dr [name] [name] is a 67 y.o. female with , type 2 DM, morbid obesity, CKD, a a hx of  many  years of longstanding persisitent afib ( ekg's reviewed from all the back to 2011 show persistent  afib) , previoulsy  followed by Dr. Sharyn Gallagher until recently, established with  Dr. Cristal Gallagher, 03/31/20.  She set her up for a cardioversion, she did convert to SR, but she had ERAF possibly after just a few hours. 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 She  did at one time weigh around 500-600 lbs. Her left atrium size is 5.3 indicating severe left atrial enlargement.She  is bothered with fatigue and shortness of breath. She is on pradaxa 150 mg bid. Dr. Cristal Gallagher wanted me to discuss tikosyn admit with the pt.  She was admitted for [facility] 11/02/20. Unfortunately, Tikosyn did not work to keep her in SR. She was started on amiodarone 200 mg bid and d/c home. She then went on to have  an hospitalization in May 2022,  with acute on chronic HF, wight up 30 lbs and was diuresed. She then went on to  Mitral and Tricuspid Valvular ring annuloplasty, Left and Right sided MAZE procedure, and clipping of her Left Atrial Appendage with Dr. Dorris Gallagher.  She tolerated the procedure without difficulty and was taken to the SICU in stable condition.  she was in NSR after surgery. SHe was followed by HF team. Her creatinine peaked at 4.36. She was supported on IV milrinone  for several days after surgery.   She then was hospitalized again 6/11 thru 6/15 with acute exacerbation of CHF. Again diuresed and had cardioversion. She was in SR on discharge.   Admitted again 02/07/21 thru 02/10/21 with substernal chest pain, nausea/vomiting and abdominal pain. She was back in afib with RVR. She again had repeat cardioversion.   She again had admission  02/25/21 with afib with RVR. She was out of amiodarone  x one week. She was started on IV amiodarone and converted. SHe was discharged on 200 mg bid of amiodarone.  EP consulted and wanted to defer treatment for 6 months until repeat echo. At that time there was discussion of PPM with AV nodal ablation.  She  was then seen acutely  by Leslie Saber, PA, for Dr. Johney Gallagher,  8/25 for being in persistent afib. She was not felt to be a current candidate for PPM with AV nodal ablation as she had just had Maze procedure in May. She was set up for cardioversion 04/19/21.   F/u in Afib clinic, 04/26/21, for successful cardioversion 04/19/21.  She was back in afib with v rate at 106 bpm. Discussion with HF and torsemide was increased.   She saw Dr. Johney Gallagher on 05/30/21 and was in SR. Plan was to continue amiodarone and eliquis.   Admitted 4/23 with recurrent HR in the setting of recurrent AF. Underwent DCCV and converted to NSR on 11/25/21.   Seen by Dr. Lalla Gallagher on 01/17/22 and determined to not be a candidate for catheter ablation and not a good candidate for AV junction ablation and PPM. Continued on amiodarone. Readmitted 5/30-6/5 for acute on chronic CHF.  Underwent DCCV on 01/31/22 for atrial flutter and successfully converted to NSR.   Admitted on 03/08/22 due to acute exacerbation of CHF and due to worsening renal function and progression to ESRD  underwent placement of right IJ catheter for HD on 03/14/22. S/p placement of left brachiocephalic fistula on 03/17/22.   HD schedule T-Th-Sat.  Recent ED visits on 09/03/22 and 09/14/22 for chest pain, nausea, and vomiting. ED EKG on 09/04/22 showed 2:1 atrial flutter.   On follow up today, her primary concern today is nausea, stomach ache, and vomiting. She currently denies chest pain or shortness of breath. She feels like her n/v and stomach issues have been going on for a while now. She voices frustration in ability to perform daily activities secondary to current symptoms. She states prescription given for Velphoro to take  TID; she was advised this medication would help breakdown her food more easily. She states it has not helped. She does endorse previously trying a medication that melted in her mouth helped somewhat for her nausea.   She has been compliant with her amiodarone and eliquis. Denies any bleeding concerns.   On follow up 07/16/23, she is currently in Afib. Recent hospital admission 11/12-15 for epigastric pain, nausea, and vomiting likely secondary to chronic gastroparesis. She was noncompliant with 2 missed dialysis Gallagher prior to hospital admission. Daughter contacted office on 11/21 noting patient was at dialysis and her monitor "flatlined." She currently takes amiodarone 200 mg daily and metoprolol 12.5 mg BID. She has a history of noted QT prolongation. Previously, Dr. Lalla Gallagher has noted patient is not a good candidate for ablation or AV nodal ablation/PPM. No missed doses of Eliquis 5 mg BID.   Today, she denies symptoms of palpitations, chest pain, shortness of breath, orthopnea, PND, lower extremity edema, dizziness, presyncope, syncope, or neurologic sequela. The patient is tolerating medications without difficulties.  Past Medical History:  Diagnosis Date   Acute on chronic systolic CHF (congestive heart failure) (HCC) 12/21/2020   Allergic rhinitis    Anemia    Arthritis    Asthma    Chronic diastolic CHF (congestive heart failure) (HCC)    Chronic kidney disease    Dialysis Tu/TH/Sa   COPD (chronic obstructive pulmonary disease) (HCC)    COVID-19 03/28/2023   Depression    DM (diabetes mellitus) (HCC)    DVT (deep vein thrombosis) in pregnancy    Gastroenteritis 03/27/2023   GERD (gastroesophageal reflux disease)    HTN (hypertension)    Hyperlipidemia    Obesity    Pneumonia 04/2018   RIGHT LOBE   Sleep apnea    needs to be retested - to get new cpap   Past Surgical History:  Procedure Laterality Date   ABDOMINAL HYSTERECTOMY     AV FISTULA PLACEMENT Left 03/17/2022    Procedure: LEFT ARM ARTERIOVENOUS (AV) FISTULA CREATION;  Surgeon: Leslie Hint, MD;  Location: [facility] OR;  Service: Vascular;  Laterality: Left;   BIOPSY  01/12/2023   Procedure: BIOPSY;  Surgeon: Jeani Hawking, MD;  Location: [facility] ENDOSCOPY;  Service: Gastroenterology;;   Thressa Sheller STUDY  11/26/2020   Procedure: BUBBLE STUDY;  Surgeon: Jodelle Red, MD;  Location: [facility] ENDOSCOPY;  Service: Cardiovascular;;   CARDIOVERSION N/A 05/06/2020   Procedure: CARDIOVERSION;  Surgeon: Jodelle Red, MD;  Location: [facility] ENDOSCOPY;  Service: Cardiovascular;  Laterality: N/A;   CARDIOVERSION N/A 11/04/2020   Procedure: CARDIOVERSION;  Surgeon: Lewayne Bunting, MD;  Location: [facility] ENDOSCOPY;  Service: Cardiovascular;  Laterality: N/A;   CARDIOVERSION N/A 02/01/2021   Procedure: CARDIOVERSION;  Surgeon: Dolores Patty, MD;  Location: [facility] ENDOSCOPY;  Service: Cardiovascular;  Laterality: N/A;   CARDIOVERSION N/A 02/09/2021   Procedure: CARDIOVERSION;  Surgeon: Dolores Patty, MD;  Location: [facility] ENDOSCOPY;  Service: Cardiovascular;  Laterality: N/A;   CARDIOVERSION N/A 04/19/2021   Procedure: CARDIOVERSION;  Surgeon: Pricilla Riffle, MD;  Location: [facility] ENDOSCOPY;  Service: Cardiovascular;  Laterality: N/A;   CARDIOVERSION N/A 11/25/2021   Procedure: CARDIOVERSION;  Surgeon: Dolores Patty, MD;  Location: [facility] ENDOSCOPY;  Service: Cardiovascular;  Laterality: N/A;   CARDIOVERSION N/A 01/31/2022   Procedure: CARDIOVERSION;  Surgeon: Dolores Patty, MD;  Location: [facility] ENDOSCOPY;  Service: Cardiovascular;  Laterality: N/A;   CATARACT EXTRACTION, BILATERAL     CHOLECYSTECTOMY     COLONOSCOPY WITH PROPOFOL N/A 03/05/2015   Procedure: COLONOSCOPY WITH PROPOFOL;  Surgeon: Jeani Hawking, MD;  Location: [facility] ENDOSCOPY;  Service: Endoscopy;  Laterality: N/A;   DIAGNOSTIC LAPAROSCOPY     ESOPHAGOGASTRODUODENOSCOPY (EGD) WITH PROPOFOL N/A 01/12/2023   Procedure: ESOPHAGOGASTRODUODENOSCOPY (EGD) WITH  PROPOFOL;  Surgeon: Jeani Hawking, MD;  Location: [facility] ENDOSCOPY;  Service: Gastroenterology;  Laterality: N/A;   FISTULA SUPERFICIALIZATION Left 03/17/2022   Procedure: FISTULA SUPERFICIALIZATION -LEFT;  Surgeon: Leslie Hint, MD;  Location: [facility] OR;  Service: Vascular;  Laterality: Left;   ingrown hallux Left    IR FLUORO GUIDE CV LINE RIGHT  03/14/2022   IR US GUIDE VASC ACCESS RIGHT  03/14/2022   KNEE SURGERY     LEFT HEART CATH AND CORONARY ANGIOGRAPHY N/A 12/31/2017   Procedure: LEFT HEART CATH AND CORONARY ANGIOGRAPHY;  Surgeon: Rinaldo Cloud, MD;  Location: [facility] INVASIVE CV LAB;  Service: Cardiovascular;  Laterality: N/A;   MAZE N/A 12/29/2020   Procedure: MAZE;  Surgeon: Loreli Slot, MD;  Location: [facility] OR;  Service: Open Heart Surgery;  Laterality: N/A;   MITRAL VALVE REPAIR N/A 12/29/2020   Procedure: MITRAL VALVE REPAIR USING CARBOMEDICS ANNULOFLEX RING SIZE 30;  Surgeon: Loreli Slot, MD;  Location: [facility] OR;  Service: Open Heart Surgery;  Laterality: N/A;   REVISON OF ARTERIOVENOUS FISTULA Left 01/05/2023   Procedure: REVISION OF LEFT ARTERIOVENOUS FISTULA WITH INTERPOSITION PTFE GRAFT;  Surgeon: Leslie Hint, MD;  Location: [facility] OR;  Service: Vascular;  Laterality: Left;  ELIQUIS AND HOLD 48 X HOURS   RIGHT HEART CATH N/A 12/22/2020   Procedure: RIGHT HEART CATH;  Surgeon: Laurey Morale, MD;  Location: [facility] INVASIVE CV LAB;  Service: Cardiovascular;  Laterality: N/A;   RIGHT HEART CATH N/A 01/31/2021   Procedure: RIGHT HEART CATH;  Surgeon: Dolores Patty, MD;  Location: [facility] INVASIVE CV LAB;  Service: Cardiovascular;  Laterality: N/A;   RIGHT HEART CATH N/A 07/29/2021   Procedure: RIGHT HEART CATH;  Surgeon: Dolores Patty, MD;  Location: [facility] INVASIVE CV LAB;  Service: Cardiovascular;  Laterality: N/A;   RIGHT HEART CATH N/A 11/24/2021   Procedure: RIGHT HEART CATH;  Surgeon: Dolores Patty, MD;  Location: [facility] INVASIVE CV LAB;  Service: Cardiovascular;   Laterality: N/A;   RIGHT HEART CATH N/A 02/14/2022   Procedure: RIGHT HEART CATH;  Surgeon: Dolores Patty, MD;  Location: [facility] INVASIVE CV LAB;  Service: Cardiovascular;  Laterality: N/A;   RIGHT HEART CATH N/A 02/22/2022   Procedure: RIGHT HEART CATH;  Surgeon: Laurey Morale, MD;  Location: [facility] INVASIVE CV LAB;  Service: Cardiovascular;  Laterality: N/A;   TEE WITHOUT CARDIOVERSION N/A 11/26/2020   Procedure: TRANSESOPHAGEAL ECHOCARDIOGRAM (TEE);  Surgeon: Jodelle Red, MD;  Location: [facility] ENDOSCOPY;  Service: Cardiovascular;  Laterality: N/A;   TEE WITHOUT CARDIOVERSION N/A 12/29/2020   Procedure: TRANSESOPHAGEAL ECHOCARDIOGRAM (TEE);  Surgeon:  Loreli Slot, MD;  Location: [facility] OR;  Service: Open Heart Surgery;  Laterality: N/A;   TEE WITHOUT CARDIOVERSION N/A 01/20/2022   Procedure: TRANSESOPHAGEAL ECHOCARDIOGRAM (TEE);  Surgeon: Dolores Patty, MD;  Location: [facility] ENDOSCOPY;  Service: Cardiovascular;  Laterality: N/A;   TRICUSPID VALVE REPLACEMENT N/A 12/29/2020   Procedure: 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 RING SIZE 34;  Surgeon: Loreli Slot, MD;  Location: [facility] OR;  Service: Open Heart Surgery;  Laterality: N/A;    Current Outpatient Medications  Medication Sig Dispense Refill   acetaminophen (TYLENOL) 325 MG tablet Take 2 tablets (650 mg total) by mouth every 6 (six) hours as needed for mild pain or moderate pain. (Patient taking differently: Take 650 mg by mouth as needed for mild pain (pain score 1-3) or moderate pain (pain score 4-6).)     albuterol (PROVENTIL HFA) 108 (90 Base) MCG/ACT inhaler Inhale 1-2 puffs into the lungs every 6 (six) hours as needed for wheezing or shortness of breath. 18 g 2   amiodarone (PACERONE) 200 MG tablet TAKE 1 TABLET BY MOUTH EVERY DAY 90 tablet 1   apixaban (ELIQUIS) 5 MG TABS tablet Take 1 tablet (5 mg total) by mouth 2 (two) times daily. 180 tablet 1   Blood Glucose Monitoring Suppl (ACCU-CHEK GUIDE) w/Device  KIT Use to check blood sugar TID. Dx: E11.69 (Patient taking differently: Use to check blood sugar TID. Dx: E11.69 Last reading: 105, 02-20-2023, Fasting.) 1 kit 0   busPIRone (BUSPAR) 5 MG tablet TAKE ONE TABLET BY MOUTH TWICE DAILY @ 9AM & 5PM 180 tablet 11   colchicine 0.6 MG tablet Take 1/2 (one-half) tablet by mouth once daily 45 tablet 0   diphenhydrAMINE (BENADRYL) 25 MG tablet Take 25 mg by mouth as needed.     Doxercalciferol (HECTOROL IV) IV given on dialysis days     Dulaglutide (TRULICITY) 1.5 MG/0.5ML SOPN Inject 1.5 mg into the skin once a week. 2 mL 2   DULoxetine (CYMBALTA) 60 MG capsule TAKE ONE CAPSULE (60MG  TOTAL) BY MOUTH DAILY AT 9AM 180 capsule 11   EPINEPHrine 0.3 mg/0.3 mL IJ SOAJ injection Inject 0.3 mg into the muscle as needed for anaphylaxis. 1 each 1   fluticasone (FLONASE ALLERGY RELIEF) 50 MCG/ACT nasal spray Place 2 sprays into both nostrils as needed.     glucose blood (ACCU-CHEK GUIDE) test strip USE TO CHECK BLOOD SUGAR 3 TIMES DAILY 100 strip 3   insulin aspart (NOVOLOG FLEXPEN) 100 UNIT/ML FlexPen Inject into the skin. ON SLIDING SCALE     insulin lispro (HUMALOG KWIKPEN) 100 UNIT/ML KwikPen Inject 10 Units into the skin 3 (three) times daily. (Patient taking differently: Inject 0-5 Units into the skin 3 (three) times daily. Sliding scale) 15 mL 3   lidocaine-prilocaine (EMLA) cream Apply 1 Application topically Every Tuesday,Thursday,and Saturday with dialysis.     linaclotide (LINZESS) 72 MCG capsule Take 72 mcg by mouth as needed.     loratadine (CLARITIN) 10 MG tablet TAKE ONE TABLET BY MOUTH DAILY AT 9AM 30 tablet 11   meclizine (ANTIVERT) 25 MG tablet Take 1 tablet (25 mg total) by mouth 3 (three) times daily as needed for dizziness. (Patient taking differently: Take 25 mg by mouth as needed for dizziness.) 30 tablet 0   metoCLOPramide (REGLAN) 5 MG tablet Take 1 tablet (5 mg total) by mouth every 8 (eight) hours as needed for nausea. 30 tablet 0    metoprolol tartrate (LOPRESSOR) 25 MG tablet Take 0.5  tablets (12.5 mg total) by mouth 2 (two) times daily. 30 tablet 0   midodrine (PROAMATINE) 10 MG tablet Take 1 tablet (10 mg total) by mouth See admin instructions. Take 10mg  (1 tablet) by mouth on Tuesday's, Thursday's, and Saturday's.     Multiple Vitamins-Minerals (MULTIVITAMIN WOMEN PO) Take 1 tablet by mouth daily.     nitroGLYCERIN (NITROSTAT) 0.4 MG SL tablet Place 1 tablet (0.4 mg total) under the tongue every 5 (five) minutes x 3 doses as needed for chest pain. 25 tablet 12   ondansetron (ZOFRAN) 8 MG tablet Take 8 mg by mouth 2 (two) times daily as needed.     pantoprazole (PROTONIX) 40 MG tablet Take 1 tablet (40 mg total) by mouth daily. 90 tablet 0   polyethylene glycol powder (MIRALAX) 17 GM/SCOOP powder Take by mouth as needed.     rosuvastatin (CRESTOR) 10 MG tablet TAKE ONE TABLET BY MOUTH DAILY AT 5PM 90 tablet 3   No current facility-administered medications for this encounter.    Allergies  Allergen Reactions   Bee Pollen Anaphylaxis and Other (See Comments)    UNCONFIRMED, but IS allergic to bee VENOM   Bee Venom Anaphylaxis   Sulfa Antibiotics Itching   Chlorhexidine    Iodinated Contrast Media Nausea And Vomiting   ROS- All systems are reviewed and negative except as per the HPI above  Physical Exam: Vitals:   07/16/23 1146  BP: 100/62  Pulse: (!) 113  Weight: 124.6 kg  Height: 5\' 7"  (1.702 m)    Wt Readings from Last 3 Encounters:  07/16/23 124.6 kg  07/06/23 126.4 kg  06/04/23 124.7 kg    Labs: Lab Results  Component Value Date   NA 130 (L) 07/06/2023   K 4.3 07/06/2023   CL 91 (L) 07/06/2023   CO2 24 07/06/2023   GLUCOSE 111 (H) 07/06/2023   BUN 47 (H) 07/06/2023   CREATININE 10.82 (H) 07/06/2023   CALCIUM 9.4 07/06/2023   PHOS 6.9 (H) 07/06/2023   MG 2.0 03/12/2022   Lab Results  Component Value Date   INR 1.9 (H) 06/18/2023   Lab Results  Component Value Date   CHOL 158  05/03/2021   HDL 50 05/03/2021   LDLCALC 91 05/03/2021   TRIG 83 05/03/2021    GEN- The patient is well appearing, alert and oriented x 3 today.   Neck - no JVD or carotid bruit noted Lungs- Clear to ausculation bilaterally, normal work of breathing Heart- Irregular rate and rhythm, no murmurs, rubs or gallops, PMI not laterally displaced Extremities- no clubbing, cyanosis, or edema Skin - no rash or ecchymosis noted   EKG-  Vent. rate 113 BPM PR interval * ms QRS duration 90 ms QT/QTcB 342/469 ms P-R-T axes * 156 94 Atrial fibrillation with rapid ventricular response with premature ventricular or aberrantly conducted complexes Possible Right ventricular hypertrophy Cannot rule out Inferior infarct , age undetermined Cannot rule out Anterior infarct , age undetermined Abnormal ECG When compared with ECG of 04-Jul-2023 06:49, PREVIOUS ECG IS PRESENT   Assessment and Plan:  1. Persistent atrial fib/flutter  Has had afib for 10+ years  Has failed Tikosyn and amiodarone historically Hx Maze procedure and multiple cardioversions   She is in Afib today. Given history of prolonged QT interval I am hesitant to reload amiodarone with a higher dose and will not plan to do so. I will continue amiodarone without dosage change. Discussed limited options with patient given history of dialysis, which includes  continuing amiodarone 200 mg daily or attempting to proceed with rate control. It is important to note her current amiodarone 200 mg daily is already providing rate control with Lopressor 12.5 mg BID. Her current BP today is 100/60 so I would be hesitant to increase her metoprolol. Rate control without amiodarone would likely be a definitivee challenge given her low BP at baseline. After discussion, she would like to proceed with cardioversion to try to maintain sinus rhythm. She took Trulicity last Wednesday. No missed doses of Eliquis. From the standpoint of our clinic, I do not have other  additional options for rhythm control at this time. If she has ERAF following cardioversion, would likely recommend follow up with EP to confirm long term treatment plan given age, amiodarone, dialysis, and limited options.   Informed Consent   Shared Decision Making/Informed Consent The risks (stroke, cardiac arrhythmias rarely resulting in the need for a temporary or permanent pacemaker, skin irritation or burns and complications associated with conscious sedation including aspiration, arrhythmia, respiratory failure and death), benefits (restoration of normal sinus rhythm) and alternatives of a direct current cardioversion were explained in detail to [name] and she agrees to proceed.      2. Chronic systolic HF She has f/u with Dr. Cristal Gallagher 08/30/23.   3. ESRD - HD - Tu/Th/Sat schedule Defer fluid mgmt to Nephrology  4. Secondary hypercoagulable state CHADS2VASC score of 4 Continue Eliquis 5 mg BID   Follow up as scheduled with Cardiology after DCCV.   Justin Mend, PA-C Afib [facility] [address] [place], [place] [address] [phone] 10/20/22 10:25 am

## 2023-07-20 NOTE — Progress Notes (Signed)
Left message on Ans machine and reminded patient about procedure and to arrive at 0645am, and to stay NPO after MN on Sunday.  Instructed to take her Eliquis on that morning.

## 2023-07-23 ENCOUNTER — Ambulatory Visit (HOSPITAL_COMMUNITY): Payer: 59 | Admitting: Anesthesiology

## 2023-07-23 ENCOUNTER — Other Ambulatory Visit: Payer: Self-pay

## 2023-07-23 ENCOUNTER — Encounter (HOSPITAL_COMMUNITY)
Admission: RE | Disposition: A | Discharge status: Home / Self Care | Payer: Self-pay | Source: Home / Self Care | Attending: Cardiovascular Disease

## 2023-07-23 ENCOUNTER — Ambulatory Visit (HOSPITAL_COMMUNITY)
Admission: RE | Admit: 2023-07-23 | Discharge: 2023-07-23 | Disposition: A | Discharge status: Home / Self Care | Payer: 59 | Attending: Cardiovascular Disease | Admitting: Cardiovascular Disease

## 2023-07-23 ENCOUNTER — Encounter (HOSPITAL_COMMUNITY): Payer: Self-pay | Admitting: Cardiovascular Disease

## 2023-07-23 DIAGNOSIS — E1122 Type 2 diabetes mellitus with diabetic chronic kidney disease: Secondary | ICD-10-CM | POA: Diagnosis not present

## 2023-07-23 DIAGNOSIS — I5042 Chronic combined systolic (congestive) and diastolic (congestive) heart failure: Secondary | ICD-10-CM | POA: Insufficient documentation

## 2023-07-23 DIAGNOSIS — Z7985 Long-term (current) use of injectable non-insulin antidiabetic drugs: Secondary | ICD-10-CM | POA: Insufficient documentation

## 2023-07-23 DIAGNOSIS — D6869 Other thrombophilia: Secondary | ICD-10-CM | POA: Diagnosis not present

## 2023-07-23 DIAGNOSIS — I132 Hypertensive heart and chronic kidney disease with heart failure and with stage 5 chronic kidney disease, or end stage renal disease: Secondary | ICD-10-CM | POA: Diagnosis not present

## 2023-07-23 DIAGNOSIS — Z79899 Other long term (current) drug therapy: Secondary | ICD-10-CM | POA: Insufficient documentation

## 2023-07-23 DIAGNOSIS — Z992 Dependence on renal dialysis: Secondary | ICD-10-CM | POA: Diagnosis not present

## 2023-07-23 DIAGNOSIS — N186 End stage renal disease: Secondary | ICD-10-CM | POA: Diagnosis not present

## 2023-07-23 DIAGNOSIS — Z794 Long term (current) use of insulin: Secondary | ICD-10-CM | POA: Diagnosis not present

## 2023-07-23 DIAGNOSIS — I4891 Unspecified atrial fibrillation: Secondary | ICD-10-CM | POA: Diagnosis not present

## 2023-07-23 DIAGNOSIS — I4819 Other persistent atrial fibrillation: Secondary | ICD-10-CM | POA: Diagnosis present

## 2023-07-23 DIAGNOSIS — I4892 Unspecified atrial flutter: Secondary | ICD-10-CM | POA: Insufficient documentation

## 2023-07-23 HISTORY — PX: CARDIOVERSION: EP1203

## 2023-07-23 SURGERY — CARDIOVERSION (CATH LAB)
Anesthesia: General

## 2023-07-23 MED ORDER — SODIUM CHLORIDE 0.9% FLUSH: 10.0000 mL | Freq: Two times a day (BID) | INTRAVENOUS | Status: DC

## 2023-07-23 MED ORDER — PROPOFOL 10 MG/ML IV BOLUS
INTRAVENOUS | Status: DC | PRN
  Administered 2023-07-23: 60 mg via INTRAVENOUS

## 2023-07-23 MED ORDER — PHENYLEPHRINE HCL (PRESSORS) 10 MG/ML IV SOLN
INTRAVENOUS | Status: DC | PRN
  Administered 2023-07-23 (×2): 160 ug via INTRAVENOUS

## 2023-07-23 MED ORDER — LIDOCAINE 2% (20 MG/ML) 5 ML SYRINGE
INTRAMUSCULAR | Status: DC | PRN
  Administered 2023-07-23: 40 mg via INTRAVENOUS

## 2023-07-23 SURGICAL SUPPLY — 1 items: PAD DEFIB RADIO PHYSIO CONN (PAD) ×1 IMPLANT

## 2023-07-23 NOTE — Op Note (Signed)
Procedure: Electrical Cardioversion Indications:  Atrial Fibrillation  Procedure Details:  Consent: Risks of procedure as well as the alternatives and risks of each were explained to the (patient/caregiver).  Consent for procedure obtained.  Time Out: Verified patient identification, verified procedure, site/side was marked, verified correct patient position, special equipment/implants available, medications/allergies/relevent history reviewed, required imaging and test results available.  Performed  Patient placed on cardiac monitor, pulse oximetry, supplemental oxygen as necessary.  Sedation given:  Propofol 60 mg IV, Dr. Hart [place] Pacer pads placed anterior and posterior chest.  Cardioverted 1 time(s).  Cardioversion with synchronized biphasic 200J shock.  Evaluation: Findings: Post procedure EKG shows: NSR Complications: None Patient did tolerate procedure well.  Time Spent Directly with the Patient:  30 minutes   [name] 07/23/2023, 7:37 AM

## 2023-07-23 NOTE — Interval H&P Note (Signed)
History and Physical Interval Note:  07/23/2023 7:26 AM  [name]  has presented today for surgery, with the diagnosis of AFIB.  The various methods of treatment have been discussed with the patient and family. After consideration of risks, benefits and other options for treatment, the patient has consented to  Procedure(s): CARDIOVERSION (CATH LAB) (N/A) as a surgical intervention.  The patient's history has been reviewed, patient examined, no change in status, stable for surgery.  I have reviewed the patient's chart and labs.  Questions were answered to the patient's satisfaction.     Dionisia Pacholski

## 2023-07-23 NOTE — Anesthesia Preprocedure Evaluation (Addendum)
Anesthesia Evaluation  Patient identified by MRN, date of birth, ID band Patient awake    Reviewed: Allergy & Precautions, NPO status , Patient's Chart, lab work & pertinent test results  Airway Mallampati: II  TM Distance: >3 FB Neck ROM: Full    Dental  (+) Edentulous Upper   Pulmonary asthma , sleep apnea , COPD    + decreased breath sounds      Cardiovascular hypertension, Pt. on home beta blockers +CHF   Rhythm:Irregular Rate:Abnormal  Echo: 1. Abnormal septal motion . Left ventricular ejection fraction, by  estimation, is 50 to 55%. The left ventricle has low normal function. The  left ventricle has no regional wall motion abnormalities.   2. Right ventricular systolic function is normal. The right ventricular  size is normal.   3. Left atrial size was severely dilated.   4. Right atrial size was severely dilated.   5. Post repair with annuloplasty ring no doppler/color flow done. The  mitral valve was not assessed. No evidence of mitral valve regurgitation.  No evidence of mitral stenosis.   6. Post repair with ring no doppler/color flow done. Tricuspid valve  regurgitation is not demonstrated.   7. The aortic valve was not assessed. Aortic valve regurgitation is not  visualized. No aortic stenosis is present.   8. The inferior vena cava is normal in size with greater than 50%  respiratory variability, suggesting right atrial pressure of 3 mmHg.      Neuro/Psych  PSYCHIATRIC DISORDERS Anxiety Depression    negative neurological ROS     GI/Hepatic Neg liver ROS,GERD  Medicated,,  Endo/Other  diabetes, Type 2, Insulin Dependent    Renal/GU Renal disease     Musculoskeletal  (+) Arthritis ,    Abdominal   Peds  Hematology  (+) Blood dyscrasia, anemia   Anesthesia Other Findings - HLD  Reproductive/Obstetrics                             Anesthesia Physical Anesthesia  Plan  ASA: 3  Anesthesia Plan: General   Post-op Pain Management: Minimal or no pain anticipated   Induction: Intravenous  PONV Risk Score and Plan: 0  Airway Management Planned: Natural Airway and Simple Face Mask  Additional Equipment: None  Intra-op Plan:   Post-operative Plan:   Informed Consent: I have reviewed the patients History and Physical, chart, labs and discussed the procedure including the risks, benefits and alternatives for the proposed anesthesia with the patient or authorized representative who has indicated his/her understanding and acceptance.       Plan Discussed with: CRNA  Anesthesia Plan Comments:        Anesthesia Quick Evaluation

## 2023-07-23 NOTE — Anesthesia Postprocedure Evaluation (Signed)
Anesthesia Post Note  Patient: [name]  Procedure(s) Performed: CARDIOVERSION (CATH LAB)     Patient location during evaluation: PACU Anesthesia Type: General Level of consciousness: awake and alert Pain management: pain level controlled Vital Signs Assessment: post-procedure vital signs reviewed and stable Respiratory status: spontaneous breathing, nonlabored ventilation, respiratory function stable and patient connected to nasal cannula oxygen Cardiovascular status: blood pressure returned to baseline and stable Postop Assessment: no apparent nausea or vomiting Anesthetic complications: no  There were no known notable events for this encounter.  Last Vitals:  Vitals:   07/23/23 0750 07/23/23 0755  BP: 99/84 100/75  Pulse: 73 72  Resp: 19 15  Temp:    SpO2: 100% 100%    Last Pain:  Vitals:   07/23/23 0755  TempSrc:   PainSc: 0-No pain                 Shelton Silvas

## 2023-07-23 NOTE — Transfer of Care (Signed)
Immediate Anesthesia Transfer of Care Note  Patient: [name]  Procedure(s) Performed: CARDIOVERSION (CATH LAB)  Patient Location: PACU and Cath Lab  Anesthesia Type:MAC  Level of Consciousness: awake, alert , and oriented  Airway & Oxygen Therapy: Patient connected to nasal cannula oxygen  Post-op Assessment: Report given to RN and Post -op Vital signs reviewed and stable  Post vital signs: Reviewed and stable  Last Vitals:  Vitals Value Taken Time  BP 127/95   Temp    Pulse 72   Resp 12   SpO2 100     Last Pain: There were no vitals filed for this visit.       Complications: There were no known notable events for this encounter.

## 2023-07-24 ENCOUNTER — Telehealth (INDEPENDENT_AMBULATORY_CARE_PROVIDER_SITE_OTHER): Payer: Self-pay | Admitting: Primary Care

## 2023-07-24 ENCOUNTER — Encounter (HOSPITAL_COMMUNITY): Payer: Self-pay | Admitting: Cardiovascular Disease

## 2023-07-24 NOTE — Telephone Encounter (Signed)
Called pt to remind them about apt. Pt will be present

## 2023-07-25 ENCOUNTER — Ambulatory Visit (INDEPENDENT_AMBULATORY_CARE_PROVIDER_SITE_OTHER): Payer: 59 | Admitting: Primary Care

## 2023-08-02 ENCOUNTER — Other Ambulatory Visit (INDEPENDENT_AMBULATORY_CARE_PROVIDER_SITE_OTHER): Payer: Self-pay | Admitting: Primary Care

## 2023-08-02 DIAGNOSIS — Z760 Encounter for issue of repeat prescription: Secondary | ICD-10-CM

## 2023-08-02 DIAGNOSIS — J0110 Acute frontal sinusitis, unspecified: Secondary | ICD-10-CM

## 2023-08-02 NOTE — Telephone Encounter (Signed)
Requested Prescriptions  Pending Prescriptions Disp Refills   loratadine (CLARITIN) 10 MG tablet [Pharmacy Med Name: loratadine 10 mg tablet] 30 tablet 6    Sig: TAKE ONE TABLET BY MOUTH DAILY AT 9AM     Ear, Nose, and Throat:  Antihistamines 2 Failed - 08/02/2023  1:08 PM      Failed - Cr in normal range and within 360 days    Creatinine, Ser  Date Value Ref Range Status  07/06/2023 10.82 (H) 0.44 - 1.00 mg/dL Final   Creatinine, Urine  Date Value Ref Range Status  07/29/2021 97.28 mg/dL Final    Comment:    Performed at [facility], [address] [place], [place] [address]         Passed - Valid encounter within last 12 months    Recent Outpatient Visits           8 months ago Generalized anxiety disorder   [place] [facility] Grayce Sessions, NP   10 months ago Diabetes mellitus type 2 in obese [facility])   [place] [facility] Grayce Sessions, NP   10 months ago Diabetes mellitus type 2 in obese [facility])   [facility] [place] Hoy Register, MD   1 year ago Constipation, unspecified constipation type   [place] [facility] Grayce Sessions, NP   1 year ago Cough productive of purulent sputum   [place] [facility] Grayce Sessions, NP       Future Appointments             In 4 weeks Jodelle Red, MD [facility] at [facility] [place]

## 2023-08-07 NOTE — Telephone Encounter (Signed)
error 

## 2023-08-23 DIAGNOSIS — N2581 Secondary hyperparathyroidism of renal origin: Secondary | ICD-10-CM | POA: Diagnosis not present

## 2023-08-23 DIAGNOSIS — N186 End stage renal disease: Secondary | ICD-10-CM | POA: Diagnosis not present

## 2023-08-23 DIAGNOSIS — D508 Other iron deficiency anemias: Secondary | ICD-10-CM | POA: Diagnosis not present

## 2023-08-23 DIAGNOSIS — D631 Anemia in chronic kidney disease: Secondary | ICD-10-CM | POA: Diagnosis not present

## 2023-08-23 DIAGNOSIS — Z992 Dependence on renal dialysis: Secondary | ICD-10-CM | POA: Diagnosis not present

## 2023-08-23 DIAGNOSIS — E1129 Type 2 diabetes mellitus with other diabetic kidney complication: Secondary | ICD-10-CM | POA: Diagnosis not present

## 2023-08-23 DIAGNOSIS — R079 Chest pain, unspecified: Secondary | ICD-10-CM | POA: Diagnosis not present

## 2023-08-25 DIAGNOSIS — Z992 Dependence on renal dialysis: Secondary | ICD-10-CM | POA: Diagnosis not present

## 2023-08-25 DIAGNOSIS — N186 End stage renal disease: Secondary | ICD-10-CM | POA: Diagnosis not present

## 2023-08-25 DIAGNOSIS — N2581 Secondary hyperparathyroidism of renal origin: Secondary | ICD-10-CM | POA: Diagnosis not present

## 2023-08-25 DIAGNOSIS — D631 Anemia in chronic kidney disease: Secondary | ICD-10-CM | POA: Diagnosis not present

## 2023-08-25 DIAGNOSIS — R079 Chest pain, unspecified: Secondary | ICD-10-CM | POA: Diagnosis not present

## 2023-08-25 DIAGNOSIS — D508 Other iron deficiency anemias: Secondary | ICD-10-CM | POA: Diagnosis not present

## 2023-08-25 DIAGNOSIS — E1129 Type 2 diabetes mellitus with other diabetic kidney complication: Secondary | ICD-10-CM | POA: Diagnosis not present

## 2023-08-28 DIAGNOSIS — D631 Anemia in chronic kidney disease: Secondary | ICD-10-CM | POA: Diagnosis not present

## 2023-08-28 DIAGNOSIS — E1129 Type 2 diabetes mellitus with other diabetic kidney complication: Secondary | ICD-10-CM | POA: Diagnosis not present

## 2023-08-28 DIAGNOSIS — D508 Other iron deficiency anemias: Secondary | ICD-10-CM | POA: Diagnosis not present

## 2023-08-28 DIAGNOSIS — R079 Chest pain, unspecified: Secondary | ICD-10-CM | POA: Diagnosis not present

## 2023-08-28 DIAGNOSIS — N2581 Secondary hyperparathyroidism of renal origin: Secondary | ICD-10-CM | POA: Diagnosis not present

## 2023-08-28 DIAGNOSIS — N186 End stage renal disease: Secondary | ICD-10-CM | POA: Diagnosis not present

## 2023-08-28 DIAGNOSIS — Z992 Dependence on renal dialysis: Secondary | ICD-10-CM | POA: Diagnosis not present

## 2023-08-29 DIAGNOSIS — S22080A Wedge compression fracture of T11-T12 vertebra, initial encounter for closed fracture: Secondary | ICD-10-CM | POA: Diagnosis not present

## 2023-08-30 ENCOUNTER — Ambulatory Visit (HOSPITAL_BASED_OUTPATIENT_CLINIC_OR_DEPARTMENT_OTHER): Payer: 59 | Admitting: Cardiology

## 2023-08-30 DIAGNOSIS — D631 Anemia in chronic kidney disease: Secondary | ICD-10-CM | POA: Diagnosis not present

## 2023-08-30 DIAGNOSIS — D508 Other iron deficiency anemias: Secondary | ICD-10-CM | POA: Diagnosis not present

## 2023-08-30 DIAGNOSIS — R079 Chest pain, unspecified: Secondary | ICD-10-CM | POA: Diagnosis not present

## 2023-08-30 DIAGNOSIS — N186 End stage renal disease: Secondary | ICD-10-CM | POA: Diagnosis not present

## 2023-08-30 DIAGNOSIS — Z992 Dependence on renal dialysis: Secondary | ICD-10-CM | POA: Diagnosis not present

## 2023-08-30 DIAGNOSIS — E1129 Type 2 diabetes mellitus with other diabetic kidney complication: Secondary | ICD-10-CM | POA: Diagnosis not present

## 2023-08-30 DIAGNOSIS — N2581 Secondary hyperparathyroidism of renal origin: Secondary | ICD-10-CM | POA: Diagnosis not present

## 2023-09-04 DIAGNOSIS — N2581 Secondary hyperparathyroidism of renal origin: Secondary | ICD-10-CM | POA: Diagnosis not present

## 2023-09-04 DIAGNOSIS — E1129 Type 2 diabetes mellitus with other diabetic kidney complication: Secondary | ICD-10-CM | POA: Diagnosis not present

## 2023-09-04 DIAGNOSIS — Z992 Dependence on renal dialysis: Secondary | ICD-10-CM | POA: Diagnosis not present

## 2023-09-04 DIAGNOSIS — D631 Anemia in chronic kidney disease: Secondary | ICD-10-CM | POA: Diagnosis not present

## 2023-09-04 DIAGNOSIS — R079 Chest pain, unspecified: Secondary | ICD-10-CM | POA: Diagnosis not present

## 2023-09-04 DIAGNOSIS — N186 End stage renal disease: Secondary | ICD-10-CM | POA: Diagnosis not present

## 2023-09-04 DIAGNOSIS — D508 Other iron deficiency anemias: Secondary | ICD-10-CM | POA: Diagnosis not present

## 2023-09-06 ENCOUNTER — Other Ambulatory Visit (HOSPITAL_COMMUNITY): Payer: Self-pay | Admitting: Internal Medicine

## 2023-09-06 DIAGNOSIS — Z992 Dependence on renal dialysis: Secondary | ICD-10-CM | POA: Diagnosis not present

## 2023-09-06 DIAGNOSIS — D631 Anemia in chronic kidney disease: Secondary | ICD-10-CM | POA: Diagnosis not present

## 2023-09-06 DIAGNOSIS — E1129 Type 2 diabetes mellitus with other diabetic kidney complication: Secondary | ICD-10-CM | POA: Diagnosis not present

## 2023-09-06 DIAGNOSIS — N2581 Secondary hyperparathyroidism of renal origin: Secondary | ICD-10-CM | POA: Diagnosis not present

## 2023-09-06 DIAGNOSIS — I4819 Other persistent atrial fibrillation: Secondary | ICD-10-CM

## 2023-09-06 DIAGNOSIS — R079 Chest pain, unspecified: Secondary | ICD-10-CM | POA: Diagnosis not present

## 2023-09-06 DIAGNOSIS — N186 End stage renal disease: Secondary | ICD-10-CM | POA: Diagnosis not present

## 2023-09-06 DIAGNOSIS — D508 Other iron deficiency anemias: Secondary | ICD-10-CM | POA: Diagnosis not present

## 2023-09-07 ENCOUNTER — Other Ambulatory Visit (INDEPENDENT_AMBULATORY_CARE_PROVIDER_SITE_OTHER): Payer: Self-pay | Admitting: Primary Care

## 2023-09-07 DIAGNOSIS — F4323 Adjustment disorder with mixed anxiety and depressed mood: Secondary | ICD-10-CM

## 2023-09-07 NOTE — Telephone Encounter (Signed)
Requested Prescriptions  Pending Prescriptions Disp Refills   busPIRone (BUSPAR) 5 MG tablet [Pharmacy Med Name: buspirone 5 mg tablet] 180 tablet 0    Sig: TAKE ONE TABLET BY MOUTH TWICE DAILY @ 9AM & 5PM     Psychiatry: Anxiolytics/Hypnotics - Non-controlled Passed - 09/07/2023 11:51 AM      Passed - Valid encounter within last 12 months    Recent Outpatient Visits           9 months ago Generalized anxiety disorder   [place] [facility] Grayce Sessions, NP   11 months ago Diabetes mellitus type 2 in obese [facility])   [place] [facility] Grayce Sessions, NP   11 months ago Diabetes mellitus type 2 in obese [facility])   [place] [facility] [place]. [facility] Hoy Register, MD   1 year ago Constipation, unspecified constipation type   [place] [facility] Grayce Sessions, NP   1 year ago Cough productive of purulent sputum   [place] [facility] Grayce Sessions, NP

## 2023-09-08 DIAGNOSIS — Z992 Dependence on renal dialysis: Secondary | ICD-10-CM | POA: Diagnosis not present

## 2023-09-08 DIAGNOSIS — D508 Other iron deficiency anemias: Secondary | ICD-10-CM | POA: Diagnosis not present

## 2023-09-08 DIAGNOSIS — N2581 Secondary hyperparathyroidism of renal origin: Secondary | ICD-10-CM | POA: Diagnosis not present

## 2023-09-08 DIAGNOSIS — D631 Anemia in chronic kidney disease: Secondary | ICD-10-CM | POA: Diagnosis not present

## 2023-09-08 DIAGNOSIS — N186 End stage renal disease: Secondary | ICD-10-CM | POA: Diagnosis not present

## 2023-09-08 DIAGNOSIS — E1129 Type 2 diabetes mellitus with other diabetic kidney complication: Secondary | ICD-10-CM | POA: Diagnosis not present

## 2023-09-08 DIAGNOSIS — R079 Chest pain, unspecified: Secondary | ICD-10-CM | POA: Diagnosis not present

## 2023-09-11 DIAGNOSIS — D508 Other iron deficiency anemias: Secondary | ICD-10-CM | POA: Diagnosis not present

## 2023-09-11 DIAGNOSIS — D631 Anemia in chronic kidney disease: Secondary | ICD-10-CM | POA: Diagnosis not present

## 2023-09-11 DIAGNOSIS — Z992 Dependence on renal dialysis: Secondary | ICD-10-CM | POA: Diagnosis not present

## 2023-09-11 DIAGNOSIS — N2581 Secondary hyperparathyroidism of renal origin: Secondary | ICD-10-CM | POA: Diagnosis not present

## 2023-09-11 DIAGNOSIS — E1129 Type 2 diabetes mellitus with other diabetic kidney complication: Secondary | ICD-10-CM | POA: Diagnosis not present

## 2023-09-11 DIAGNOSIS — N186 End stage renal disease: Secondary | ICD-10-CM | POA: Diagnosis not present

## 2023-09-11 DIAGNOSIS — R079 Chest pain, unspecified: Secondary | ICD-10-CM | POA: Diagnosis not present

## 2023-09-12 ENCOUNTER — Ambulatory Visit: Payer: 59 | Attending: Physical Therapy | Admitting: Physical Therapy

## 2023-09-13 ENCOUNTER — Other Ambulatory Visit (INDEPENDENT_AMBULATORY_CARE_PROVIDER_SITE_OTHER): Payer: Self-pay | Admitting: Primary Care

## 2023-09-13 DIAGNOSIS — F4323 Adjustment disorder with mixed anxiety and depressed mood: Secondary | ICD-10-CM

## 2023-09-13 DIAGNOSIS — N2581 Secondary hyperparathyroidism of renal origin: Secondary | ICD-10-CM | POA: Diagnosis not present

## 2023-09-13 DIAGNOSIS — Z992 Dependence on renal dialysis: Secondary | ICD-10-CM | POA: Diagnosis not present

## 2023-09-13 DIAGNOSIS — D508 Other iron deficiency anemias: Secondary | ICD-10-CM | POA: Diagnosis not present

## 2023-09-13 DIAGNOSIS — N186 End stage renal disease: Secondary | ICD-10-CM | POA: Diagnosis not present

## 2023-09-13 DIAGNOSIS — R079 Chest pain, unspecified: Secondary | ICD-10-CM | POA: Diagnosis not present

## 2023-09-13 DIAGNOSIS — E1129 Type 2 diabetes mellitus with other diabetic kidney complication: Secondary | ICD-10-CM | POA: Diagnosis not present

## 2023-09-13 DIAGNOSIS — D631 Anemia in chronic kidney disease: Secondary | ICD-10-CM | POA: Diagnosis not present

## 2023-09-13 NOTE — Telephone Encounter (Unsigned)
Copied from CRM [id number]. Topic: Clinical - Medication Refill >> Sep 13, 2023  3:22 PM Patsy Lager T wrote: Most Recent Primary Care Visit:  Provider: Recardo Evangelist A  Department: [facility]  Visit Type: MEDICARE AWV, SEQUENTIAL  Date: 06/04/2023  Medication: busPIRone (BUSPAR) 5 MG tablet  Has the patient contacted their pharmacy? No Pharmacy called  Is this the correct pharmacy for this prescription? Yes  If no, delete pharmacy and type the correct one.  This is the patient's preferred pharmacy: Yes  [facility] [place] [address] [place] [address] Phone: [phone] Fax: [phone]   Has the prescription been filled recently? Yes  Is the patient out of the medication? Yes  Has the patient been seen for an appointment in the last year OR does the patient have an upcoming appointment? Yes  Can we respond through MyChart? No  Agent: Please be advised that Rx refills may take up to 3 business days. We ask that you follow-up with your pharmacy.

## 2023-09-13 NOTE — Telephone Encounter (Signed)
Copied from CRM [id number]. Topic: Clinical - Medication Refill >> Sep 13, 2023  3:27 PM Patsy Lager T wrote: Most Recent Primary Care Visit:  Provider: Recardo Evangelist A  Department: [facility]  Visit Type: MEDICARE AWV, SEQUENTIAL  Date: 06/04/2023  Medication: busPIRone (BUSPAR) 5 MG tablet [  Has the patient contacted their pharmacy? Yes Pharmacy called for refill  Is this the correct pharmacy for this prescription? Yes  This is the patient's preferred pharmacy:  [facility] [place] [address] [place] [address] Phone: [phone] Fax: [phone]  Has the prescription been filled recently? Yes  Is the patient out of the medication? Yes  Has the patient been seen for an appointment in the last year OR does the patient have an upcoming appointment? Yes  Can we respond through MyChart? No  Agent: Please be advised that Rx refills may take up to 3 business days. We ask that you follow-up with your pharmacy.

## 2023-09-15 DIAGNOSIS — D508 Other iron deficiency anemias: Secondary | ICD-10-CM | POA: Diagnosis not present

## 2023-09-15 DIAGNOSIS — R079 Chest pain, unspecified: Secondary | ICD-10-CM | POA: Diagnosis not present

## 2023-09-15 DIAGNOSIS — Z992 Dependence on renal dialysis: Secondary | ICD-10-CM | POA: Diagnosis not present

## 2023-09-15 DIAGNOSIS — N2581 Secondary hyperparathyroidism of renal origin: Secondary | ICD-10-CM | POA: Diagnosis not present

## 2023-09-15 DIAGNOSIS — E1129 Type 2 diabetes mellitus with other diabetic kidney complication: Secondary | ICD-10-CM | POA: Diagnosis not present

## 2023-09-15 DIAGNOSIS — D631 Anemia in chronic kidney disease: Secondary | ICD-10-CM | POA: Diagnosis not present

## 2023-09-15 DIAGNOSIS — N186 End stage renal disease: Secondary | ICD-10-CM | POA: Diagnosis not present

## 2023-09-18 DIAGNOSIS — Z992 Dependence on renal dialysis: Secondary | ICD-10-CM | POA: Diagnosis not present

## 2023-09-18 DIAGNOSIS — N2581 Secondary hyperparathyroidism of renal origin: Secondary | ICD-10-CM | POA: Diagnosis not present

## 2023-09-18 DIAGNOSIS — N186 End stage renal disease: Secondary | ICD-10-CM | POA: Diagnosis not present

## 2023-09-18 DIAGNOSIS — D631 Anemia in chronic kidney disease: Secondary | ICD-10-CM | POA: Diagnosis not present

## 2023-09-18 DIAGNOSIS — E1129 Type 2 diabetes mellitus with other diabetic kidney complication: Secondary | ICD-10-CM | POA: Diagnosis not present

## 2023-09-18 DIAGNOSIS — R079 Chest pain, unspecified: Secondary | ICD-10-CM | POA: Diagnosis not present

## 2023-09-18 DIAGNOSIS — D508 Other iron deficiency anemias: Secondary | ICD-10-CM | POA: Diagnosis not present

## 2023-09-20 ENCOUNTER — Telehealth (INDEPENDENT_AMBULATORY_CARE_PROVIDER_SITE_OTHER): Payer: Self-pay | Admitting: Primary Care

## 2023-09-20 ENCOUNTER — Telehealth (HOSPITAL_BASED_OUTPATIENT_CLINIC_OR_DEPARTMENT_OTHER): Payer: Self-pay | Admitting: Cardiology

## 2023-09-20 ENCOUNTER — Ambulatory Visit (INDEPENDENT_AMBULATORY_CARE_PROVIDER_SITE_OTHER): Payer: Self-pay | Admitting: Primary Care

## 2023-09-20 DIAGNOSIS — Z992 Dependence on renal dialysis: Secondary | ICD-10-CM | POA: Diagnosis not present

## 2023-09-20 DIAGNOSIS — D508 Other iron deficiency anemias: Secondary | ICD-10-CM | POA: Diagnosis not present

## 2023-09-20 DIAGNOSIS — R079 Chest pain, unspecified: Secondary | ICD-10-CM | POA: Diagnosis not present

## 2023-09-20 DIAGNOSIS — D631 Anemia in chronic kidney disease: Secondary | ICD-10-CM | POA: Diagnosis not present

## 2023-09-20 DIAGNOSIS — N186 End stage renal disease: Secondary | ICD-10-CM | POA: Diagnosis not present

## 2023-09-20 DIAGNOSIS — E1129 Type 2 diabetes mellitus with other diabetic kidney complication: Secondary | ICD-10-CM | POA: Diagnosis not present

## 2023-09-20 DIAGNOSIS — N2581 Secondary hyperparathyroidism of renal origin: Secondary | ICD-10-CM | POA: Diagnosis not present

## 2023-09-20 NOTE — Telephone Encounter (Signed)
Advised patient, verbalized understanding

## 2023-09-20 NOTE — Telephone Encounter (Signed)
  Chief Complaint: Shortness of breath Symptoms: shortness of breath, chest pain, HR 154-165, had to be placed on oxygen at dialysis today Frequency: patient states she has felt bad for the last couple of days Pertinent Negatives: Patient denies fever Disposition: [x] ED /[] Urgent Care (no appt availability in office) / [] Appointment(In office/virtual)/ []  [place] [facility]/ [] Home Care/ [] Refused Recommended Disposition /[] [place] [facility]/ []  Follow-up with PCP Additional Notes: patient called with shortness of breath. Patient states she was placed on oxygen while at dialysis today. Patient states she felt better after being on oxygen. Patient was discharged home after dialysis on room air. Patient endorses she has felt short of breath and chest pain for the last couple of days. Patient checks her pulse ox while on the phone-initial o2 of 89% which went up to 94%. HR at 154-165. Patient's daughter is spoken to about calling 911 for transport to the emergency department. Patient and daughter verbalize understanding of plan and all questions answered.   Copied from CRM [id number]. Topic: Clinical - Red Word Triage >> Sep 20, 2023  2:56 PM Patsy Lager T wrote: Red Word that prompted transfer to Nurse Triage: patient dialysis today and they had to put oxygen on her as she was short of breath Reason for Disposition  [1] MODERATE difficulty breathing (e.g., speaks in phrases, SOB even at rest, pulse 100-120) AND [2] NEW-onset or WORSE than normal  Answer Assessment - Initial Assessment Questions 1. RESPIRATORY STATUS: "Describe your breathing?" (e.g., wheezing, shortness of breath, unable to speak, severe coughing)      Shortness of breath 2. ONSET: "When did this breathing problem begin?"      "Been going on for awhile" 3. PATTERN "Does the difficult breathing come and go, or has it been constant since it started?"      intermittent 4. SEVERITY: "How bad is your breathing?" (e.g., mild,  moderate, severe)    - MILD: No SOB at rest, mild SOB with walking, speaks normally in sentences, can lie down, no retractions, pulse < 100.    - MODERATE: SOB at rest, SOB with minimal exertion and prefers to sit, cannot lie down flat, speaks in phrases, mild retractions, audible wheezing, pulse 100-120.    - SEVERE: Very SOB at rest, speaks in single words, struggling to breathe, sitting hunched forward, retractions, pulse > 120      Moderate 5. RECURRENT SYMPTOM: "Have you had difficulty breathing before?" If Yes, ask: "When was the last time?" and "What happened that time?"      Yes-patient's states she was on oxygen at some point but doesn't remember when this was.  6. CARDIAC HISTORY: "Do you have any history of heart disease?" (e.g., heart attack, angina, bypass surgery, angioplasty)      CHF, a fib, bypass surgery 7. LUNG HISTORY: "Do you have any history of lung disease?"  (e.g., pulmonary embolus, asthma, emphysema)     asthma 8. CAUSE: "What do you think is causing the breathing problem?"      unsure 9. OTHER SYMPTOMS: "Do you have any other symptoms? (e.g., dizziness, runny nose, cough, chest pain, fever)     Chest pain, cough, runny nose 10. O2 SATURATION MONITOR:  "Do you use an oxygen saturation monitor (pulse oximeter) at home?" If Yes, ask: "What is your reading (oxygen level) today?" "What is your usual oxygen saturation reading?" (e.g., 95%)      Initial pulse ox 89%-increased to 94% HR-154-165  Protocols used: Breathing Difficulty-A-AH

## 2023-09-20 NOTE — Telephone Encounter (Signed)
Agree with plan for office visit with EKG. If heart rate >110 bpm for >30 minutes could take extra half tablet of Metoprolol Tartrate.   Alver Sorrow, NP

## 2023-09-20 NOTE — Telephone Encounter (Signed)
Patient c/o Palpitations:  STAT if patient reporting lightheadedness, shortness of breath, or chest pain  How long have you had palpitations/irregular HR/ Afib? Are you having the symptoms now?   Yes  Are you currently experiencing lightheadedness, SOB or CP?   Yes  Do you have a history of afib (atrial fibrillation) or irregular heart rhythm?   Yes  Have you checked your BP or HR? (document readings if available):   HR 93/03  Are you experiencing any other symptoms?   Headache, nausea  Patient is concerned about being in Afib and wants advice on next steps.

## 2023-09-20 NOTE — Telephone Encounter (Signed)
Noted

## 2023-09-20 NOTE — Telephone Encounter (Signed)
E2C2 nurse "Kriste Basque" called about [phone]. Nurse said she asked pt to go to the Emergency room to be seen. She was calling to inform provider about pt being sent to the hospital.

## 2023-09-20 NOTE — Telephone Encounter (Signed)
Spoke with patient who stated after dialysis today she started feeling lightheaded and dizzy  Came home from dialysis and has been resting HR was up to 154 earlier Granddaughter checked her HR with pulse ox, 67 and then manually 72 Per granddaughter who is a nurse felt irregular Blood pressure 120/70 Scheduled patient an appointment to see Dr Cristal Deer tomorrow, patient gets anxious going to Afib clinic  Advised patient to go to ED if HR goes back up to 150's and does not come down Patient stated she was feeling some better

## 2023-09-21 ENCOUNTER — Encounter (INDEPENDENT_AMBULATORY_CARE_PROVIDER_SITE_OTHER): Payer: Self-pay | Admitting: Primary Care

## 2023-09-21 ENCOUNTER — Ambulatory Visit (INDEPENDENT_AMBULATORY_CARE_PROVIDER_SITE_OTHER): Payer: 59 | Admitting: Cardiology

## 2023-09-21 ENCOUNTER — Encounter (HOSPITAL_BASED_OUTPATIENT_CLINIC_OR_DEPARTMENT_OTHER): Payer: Self-pay | Admitting: Cardiology

## 2023-09-21 VITALS — BP 128/78 | HR 90 | Ht 66.0 in | Wt 271.0 lb

## 2023-09-21 DIAGNOSIS — Z992 Dependence on renal dialysis: Secondary | ICD-10-CM

## 2023-09-21 DIAGNOSIS — D6869 Other thrombophilia: Secondary | ICD-10-CM | POA: Diagnosis not present

## 2023-09-21 DIAGNOSIS — Z794 Long term (current) use of insulin: Secondary | ICD-10-CM

## 2023-09-21 DIAGNOSIS — I480 Paroxysmal atrial fibrillation: Secondary | ICD-10-CM | POA: Diagnosis not present

## 2023-09-21 DIAGNOSIS — E1122 Type 2 diabetes mellitus with diabetic chronic kidney disease: Secondary | ICD-10-CM

## 2023-09-21 DIAGNOSIS — Z7901 Long term (current) use of anticoagulants: Secondary | ICD-10-CM | POA: Diagnosis not present

## 2023-09-21 DIAGNOSIS — R079 Chest pain, unspecified: Secondary | ICD-10-CM | POA: Diagnosis not present

## 2023-09-21 DIAGNOSIS — N186 End stage renal disease: Secondary | ICD-10-CM

## 2023-09-21 DIAGNOSIS — Z9889 Other specified postprocedural states: Secondary | ICD-10-CM

## 2023-09-21 NOTE — Progress Notes (Signed)
Cardiology Office Note:  .   Date:  09/21/2023  ID:  [name], DOB 1956/06/05, MRN [id number] PCP: Grayce Sessions, NP  [facility] Providers Cardiologist:  Leslie Red, MD Electrophysiologist:  Lanier Prude, MD {  History of Present Illness: .   [name] is a 68 y.o. female ith a hx of chronic systolic and diastolic congestive heart failure, hypertension, hyperlipidemia, diabetes mellitus, deep vein thrombosis, COPD, asthma, sleep apnea, obesity, and depression, ESRD on HD who is seen for follow-up.    She has had a long and complex cardiac course. She has frequent readmissions for heart failure, chest pain, and shortness of breath. Please see prior notes for details.  Today: Feeling poorly. Nausea/vomiting, no appetite, feels cold. Stomach has been upset for a while, pending eval with GI at [facility] for second opinion. Feels worse after she eats. Was vomiting after every time she ate or even smelled food; vomiting is less, only once so far today, but feels nauseated all the time. Has constant epigastric pain. Feels that breathing is worse with walking; can walk from living room to bathroom but farther than that she is short of breath. She had epigastric pain and shortness of breath laying down; has been sleeping more upright than usual. Has discomfort in legs/feet but no increase in swelling per se.  Tried to have gastric emptying study bur she vomited up the eggs and couldn't complete the study.  Hasn't been told to stop trulicity. Reglan helps some of her symptoms. Her diabetes is managed by her PCP.  Has been having lightheadedness with dialysis. Had a change in her dry weight, not sure if up or down. Did feel better when they put her on oxygen at dialysis when she felt dizzy. No syncope but sometimes feels like she might pass out.  Had cardioversion 07/23/23, in sinus today. She feels like she may go in and out. Has noted intermittent  heart rate elevations to 150s, but when her granddaughter has checked her it's 60s-70s.  Last seen by afib clinic 07/16/23, had cardioversion after. She is nervous about heights and all the windows on [address] make her anxious.  Has intermittent chest pressure, no clear triggers or pattern. Can have right or left sided, sometimes at the same time as epigastric pain but higher.   Gives permission to speak to [name], daughter, phone number [phone] for more information.  ROS: diffusely positive as above  Studies Reviewed: Marland Kitchen    EKG:  EKG Interpretation Date/Time:  Friday September 21 2023 08:59:35 EST Ventricular Rate:  90 PR Interval:    QRS Duration:  86 QT Interval:  410 QTC Calculation: 501 R Axis:   153  Text Interpretation: sinus rhythm with 1st degree AV block Possible Anterolateral infarct (cited on or before 21-Sep-2023) Confirmed by Leslie Red [phone]) on 09/21/2023 9:12:05 AM    Physical Exam:   VS:  BP 128/78 (BP Location: Left Arm, Patient Position: Sitting, Cuff Size: Normal)   Pulse 90   Ht 5\' 6"  (1.676 m)   Wt 271 lb (122.9 kg)   SpO2 93%   BMI 43.74 kg/m    Wt Readings from Last 3 Encounters:  09/21/23 271 lb (122.9 kg)  07/23/23 265 lb (120.2 kg)  07/16/23 274 lb 9.6 oz (124.6 kg)    GEN: Well nourished, well developed in no acute distress HEENT: Normal, moist mucous membranes NECK: No JVD CARDIAC: regular rhythm, normal S1 and S2, no rubs  or gallops. No murmur. VASCULAR: Radial and DP pulses 2+ bilaterally. No carotid bruits RESPIRATORY:  Clear to auscultation without rales, wheezing or rhonchi  ABDOMEN: Soft, non-tender, non-distended MUSCULOSKELETAL:  Ambulates independently SKIN: Warm and dry, no edema NEUROLOGIC:  Alert and oriented x 3. No focal neuro deficits noted. PSYCHIATRIC:  Normal affect    ASSESSMENT AND PLAN: .    Abdominal pain Nausea/vomiting -has recently seen Dr. Elnoria Howard. I cannot see full report as he is in the [place]  system, but she reports attempt at gastric emptying study but she did not tolerate -I would consider stopping GLP1; will reach out to her PCP. Reports some improvement with reglan, so gastric motility may be part of the issue -if we cannot find another etiology, may unfortunately need to consider stopping amiodarone, see below.  atrial fibrillation, persistent Atypical atrial flutter -CHA2DS2/VAS Stroke Risk Points=4, continue DOAC -has tried tikosyn, had MAZE and multiple cardioversions -continue metoprolol, amiodarone for now -concern is that if no other etiology for her symptoms found, may need to stop amiodarone. She is extremely symptomatic with her afib and is in sinus rhythm today -per last note from afib clinic, will need to discuss with EP attending if there are other options -will reach out to Dr. Lalla Brothers   S/P MVR and TVR, with post op mitral stenosis chronic systolic and diastolic heart failure, last EF improved Nonischemic cardiomyopathy Nonobstructive CAD Type II diabetes -following closely with heart failure clinic -NYHA 3-4 -last EF improved to 50-55%, prior 35-40% -now on dialysis -on metoprolol succinate -no aspirin when on DOAC -continue statin -would consider stopping GLP as above, may need to uptitrate insulin with this   History of obstructive sleep apnea, with CPAP at home: -previously ordered repeat sleep study, not completed   Strong family history of heart disease/heart failure: she is unclear of the etiology of this  CV risk counseling and prevention -recommend heart healthy/Mediterranean diet, with whole grains, fruits, vegetable, fish, lean meats, nuts, and olive oil. Limit salt. -recommend moderate walking, 3-5 times/week for 30-50 minutes each session. Aim for at least 150 minutes.week. Goal should be pace of 3 miles/hours, or walking 1.5 miles in 30 minutes -recommend avoidance of tobacco products. Avoid excess alcohol.  Dispo: 6-8 weeks with Dr.  Gala Romney, 3 mos with me  Total time of encounter: I spent 51 minutes dedicated to the care of this patient on the date of this encounter to include pre-visit review of records, face-to-face time with the patient discussing conditions above, and clinical documentation with the electronic health record. We specifically spent time today discussing symptoms, possible etiologies, next steps as above. Also spent additional time communicating with her team (Drs Phylliss Bob, Bensimhon, Lake Bells, and Gwinda Passe).   Signed, Leslie Red, MD   Leslie Red, MD, PhD, [facility]  [facility]  [place]  Heart & Vascular at [facility] at [facility] [address] [place], [place] [address] [phone]

## 2023-09-21 NOTE — Patient Instructions (Addendum)
Medication Instructions:  No medication changes were made during today's visit.  *If you need a refill on your cardiac medications before your next appointment, please call your pharmacy*   Lab Work: No labs were ordered during today's visit.   If you have labs (blood work) drawn today and your tests are completely normal, you will receive your results only by: MyChart Message (if you have MyChart) OR A paper copy in the mail If you have any lab test that is abnormal or we need to change your treatment, we will call you to review the results.   Testing/Procedures: Your physician has requested that you have an echocardiogram. Echocardiography is a painless test that uses sound waves to create images of your heart. It provides your doctor with information about the size and shape of your heart and how well your heart's chambers and valves are working. This procedure takes approximately one hour. There are no restrictions for this procedure. Please do NOT wear cologne, perfume, aftershave, or lotions (deodorant is allowed). Please arrive 15 minutes prior to your appointment time.  Please note: We ask at that you not bring children with you during ultrasound (echo/ vascular) testing. Due to room size and safety concerns, children are not allowed in the ultrasound rooms during exams. Our front office staff cannot provide observation of children in our lobby area while testing is being conducted. An adult accompanying a patient to their appointment will only be allowed in the ultrasound room at the discretion of the ultrasound technician under special circumstances. We apologize for any inconvenience.    Follow-Up: At [facility], you and your health needs are our priority.  As part of our continuing mission to provide you with exceptional heart care, we have created designated Provider Care Teams.  These Care Teams include your primary Cardiologist (physician) and Advanced Practice  Providers (APPs -  Physician Assistants and Nurse Practitioners) who all work together to provide you with the care you need, when you need it.  We recommend signing up for the patient portal called "MyChart".  Sign up information is provided on this After Visit Summary.  MyChart is used to connect with patients for Virtual Visits (Telemedicine).  Patients are able to view lab/test results, encounter notes, upcoming appointments, etc.  Non-urgent messages can be sent to your provider as well.   To learn more about what you can do with MyChart, go to [email or web address].    Your next appointment:   3 month(s)  Provider:   Jodelle Red, MD    Other Instructions Thank you for choosing [facility]!

## 2023-09-22 DIAGNOSIS — N2581 Secondary hyperparathyroidism of renal origin: Secondary | ICD-10-CM | POA: Diagnosis not present

## 2023-09-22 DIAGNOSIS — R079 Chest pain, unspecified: Secondary | ICD-10-CM | POA: Diagnosis not present

## 2023-09-22 DIAGNOSIS — D508 Other iron deficiency anemias: Secondary | ICD-10-CM | POA: Diagnosis not present

## 2023-09-22 DIAGNOSIS — E1129 Type 2 diabetes mellitus with other diabetic kidney complication: Secondary | ICD-10-CM | POA: Diagnosis not present

## 2023-09-22 DIAGNOSIS — D631 Anemia in chronic kidney disease: Secondary | ICD-10-CM | POA: Diagnosis not present

## 2023-09-22 DIAGNOSIS — N186 End stage renal disease: Secondary | ICD-10-CM | POA: Diagnosis not present

## 2023-09-22 DIAGNOSIS — Z992 Dependence on renal dialysis: Secondary | ICD-10-CM | POA: Diagnosis not present

## 2023-09-25 DIAGNOSIS — D631 Anemia in chronic kidney disease: Secondary | ICD-10-CM | POA: Diagnosis not present

## 2023-09-25 DIAGNOSIS — Z992 Dependence on renal dialysis: Secondary | ICD-10-CM | POA: Diagnosis not present

## 2023-09-25 DIAGNOSIS — N2581 Secondary hyperparathyroidism of renal origin: Secondary | ICD-10-CM | POA: Diagnosis not present

## 2023-09-25 DIAGNOSIS — R079 Chest pain, unspecified: Secondary | ICD-10-CM | POA: Diagnosis not present

## 2023-09-25 DIAGNOSIS — E1129 Type 2 diabetes mellitus with other diabetic kidney complication: Secondary | ICD-10-CM | POA: Diagnosis not present

## 2023-09-25 DIAGNOSIS — D508 Other iron deficiency anemias: Secondary | ICD-10-CM | POA: Diagnosis not present

## 2023-09-25 DIAGNOSIS — N186 End stage renal disease: Secondary | ICD-10-CM | POA: Diagnosis not present

## 2023-09-27 ENCOUNTER — Other Ambulatory Visit (INDEPENDENT_AMBULATORY_CARE_PROVIDER_SITE_OTHER): Payer: Self-pay | Admitting: Primary Care

## 2023-09-27 ENCOUNTER — Telehealth: Payer: Self-pay

## 2023-09-27 ENCOUNTER — Other Ambulatory Visit: Payer: Self-pay | Admitting: Pharmacist

## 2023-09-27 DIAGNOSIS — R079 Chest pain, unspecified: Secondary | ICD-10-CM | POA: Diagnosis not present

## 2023-09-27 DIAGNOSIS — N186 End stage renal disease: Secondary | ICD-10-CM | POA: Diagnosis not present

## 2023-09-27 DIAGNOSIS — D631 Anemia in chronic kidney disease: Secondary | ICD-10-CM | POA: Diagnosis not present

## 2023-09-27 DIAGNOSIS — Z992 Dependence on renal dialysis: Secondary | ICD-10-CM | POA: Diagnosis not present

## 2023-09-27 DIAGNOSIS — D508 Other iron deficiency anemias: Secondary | ICD-10-CM | POA: Diagnosis not present

## 2023-09-27 DIAGNOSIS — E1129 Type 2 diabetes mellitus with other diabetic kidney complication: Secondary | ICD-10-CM | POA: Diagnosis not present

## 2023-09-27 DIAGNOSIS — N2581 Secondary hyperparathyroidism of renal origin: Secondary | ICD-10-CM | POA: Diagnosis not present

## 2023-09-27 MED ORDER — NOVOLOG FLEXPEN 100 UNIT/ML ~~LOC~~ SOPN: 12.0000 [IU] | PEN_INJECTOR | Freq: Three times a day (TID) | SUBCUTANEOUS | 2 refills | Status: DC

## 2023-09-27 MED ORDER — INSULIN LISPRO (1 UNIT DIAL) 100 UNIT/ML (KWIKPEN): PEN_INJECTOR | SUBCUTANEOUS | 2 refills | Status: AC

## 2023-09-27 MED ORDER — LANTUS SOLOSTAR 100 UNIT/ML ~~LOC~~ SOPN: 10.0000 [IU] | PEN_INJECTOR | Freq: Every day | SUBCUTANEOUS | 3 refills | Status: DC

## 2023-09-27 NOTE — Telephone Encounter (Signed)
 Spoke with Ronal at [facility]-  comfirmed patient sliding scale      Sig: For blood sugars 0-150 give 0 units of insulin , 151-200 give 2 units of insulin , 201-250 give 4 units, 251-300 give 6 units, 301-350 give 8 units, 351-400 give 10 units,> 400 give 12 units and call M.D.      Before meals

## 2023-09-27 NOTE — Telephone Encounter (Signed)
 Copied from CRM [id number]. Topic: Clinical - Prescription Issue >> Sep 27, 2023  4:45 PM Delon DASEN wrote: Reason for CRM: Ronal with [facility]- insulin  lispro (HUMALOG  [facility]) 100 UNIT/ML KwikPen  need to know max units per day, please call [phone]

## 2023-09-29 DIAGNOSIS — N186 End stage renal disease: Secondary | ICD-10-CM | POA: Diagnosis not present

## 2023-09-29 DIAGNOSIS — R079 Chest pain, unspecified: Secondary | ICD-10-CM | POA: Diagnosis not present

## 2023-09-29 DIAGNOSIS — Z992 Dependence on renal dialysis: Secondary | ICD-10-CM | POA: Diagnosis not present

## 2023-09-29 DIAGNOSIS — D508 Other iron deficiency anemias: Secondary | ICD-10-CM | POA: Diagnosis not present

## 2023-09-29 DIAGNOSIS — D631 Anemia in chronic kidney disease: Secondary | ICD-10-CM | POA: Diagnosis not present

## 2023-09-29 DIAGNOSIS — E1129 Type 2 diabetes mellitus with other diabetic kidney complication: Secondary | ICD-10-CM | POA: Diagnosis not present

## 2023-09-29 DIAGNOSIS — N2581 Secondary hyperparathyroidism of renal origin: Secondary | ICD-10-CM | POA: Diagnosis not present

## 2023-10-02 DIAGNOSIS — D508 Other iron deficiency anemias: Secondary | ICD-10-CM | POA: Diagnosis not present

## 2023-10-02 DIAGNOSIS — E1129 Type 2 diabetes mellitus with other diabetic kidney complication: Secondary | ICD-10-CM | POA: Diagnosis not present

## 2023-10-02 DIAGNOSIS — N186 End stage renal disease: Secondary | ICD-10-CM | POA: Diagnosis not present

## 2023-10-02 DIAGNOSIS — R079 Chest pain, unspecified: Secondary | ICD-10-CM | POA: Diagnosis not present

## 2023-10-02 DIAGNOSIS — Z992 Dependence on renal dialysis: Secondary | ICD-10-CM | POA: Diagnosis not present

## 2023-10-02 DIAGNOSIS — N2581 Secondary hyperparathyroidism of renal origin: Secondary | ICD-10-CM | POA: Diagnosis not present

## 2023-10-02 DIAGNOSIS — D631 Anemia in chronic kidney disease: Secondary | ICD-10-CM | POA: Diagnosis not present

## 2023-10-04 DIAGNOSIS — E1129 Type 2 diabetes mellitus with other diabetic kidney complication: Secondary | ICD-10-CM | POA: Diagnosis not present

## 2023-10-04 DIAGNOSIS — D508 Other iron deficiency anemias: Secondary | ICD-10-CM | POA: Diagnosis not present

## 2023-10-04 DIAGNOSIS — R079 Chest pain, unspecified: Secondary | ICD-10-CM | POA: Diagnosis not present

## 2023-10-04 DIAGNOSIS — D631 Anemia in chronic kidney disease: Secondary | ICD-10-CM | POA: Diagnosis not present

## 2023-10-04 DIAGNOSIS — Z992 Dependence on renal dialysis: Secondary | ICD-10-CM | POA: Diagnosis not present

## 2023-10-04 DIAGNOSIS — N186 End stage renal disease: Secondary | ICD-10-CM | POA: Diagnosis not present

## 2023-10-04 DIAGNOSIS — N2581 Secondary hyperparathyroidism of renal origin: Secondary | ICD-10-CM | POA: Diagnosis not present

## 2023-10-06 DIAGNOSIS — R079 Chest pain, unspecified: Secondary | ICD-10-CM | POA: Diagnosis not present

## 2023-10-06 DIAGNOSIS — Z992 Dependence on renal dialysis: Secondary | ICD-10-CM | POA: Diagnosis not present

## 2023-10-06 DIAGNOSIS — N186 End stage renal disease: Secondary | ICD-10-CM | POA: Diagnosis not present

## 2023-10-06 DIAGNOSIS — D508 Other iron deficiency anemias: Secondary | ICD-10-CM | POA: Diagnosis not present

## 2023-10-06 DIAGNOSIS — E1129 Type 2 diabetes mellitus with other diabetic kidney complication: Secondary | ICD-10-CM | POA: Diagnosis not present

## 2023-10-06 DIAGNOSIS — N2581 Secondary hyperparathyroidism of renal origin: Secondary | ICD-10-CM | POA: Diagnosis not present

## 2023-10-06 DIAGNOSIS — D631 Anemia in chronic kidney disease: Secondary | ICD-10-CM | POA: Diagnosis not present

## 2023-10-09 DIAGNOSIS — N2581 Secondary hyperparathyroidism of renal origin: Secondary | ICD-10-CM | POA: Diagnosis not present

## 2023-10-09 DIAGNOSIS — R079 Chest pain, unspecified: Secondary | ICD-10-CM | POA: Diagnosis not present

## 2023-10-09 DIAGNOSIS — Z992 Dependence on renal dialysis: Secondary | ICD-10-CM | POA: Diagnosis not present

## 2023-10-09 DIAGNOSIS — D631 Anemia in chronic kidney disease: Secondary | ICD-10-CM | POA: Diagnosis not present

## 2023-10-09 DIAGNOSIS — E1129 Type 2 diabetes mellitus with other diabetic kidney complication: Secondary | ICD-10-CM | POA: Diagnosis not present

## 2023-10-09 DIAGNOSIS — N186 End stage renal disease: Secondary | ICD-10-CM | POA: Diagnosis not present

## 2023-10-09 DIAGNOSIS — D508 Other iron deficiency anemias: Secondary | ICD-10-CM | POA: Diagnosis not present

## 2023-10-10 ENCOUNTER — Telehealth (HOSPITAL_COMMUNITY): Payer: Self-pay | Admitting: Vascular Surgery

## 2023-10-10 NOTE — Telephone Encounter (Signed)
Lvm to make app/np f/u in March

## 2023-10-11 DIAGNOSIS — N2581 Secondary hyperparathyroidism of renal origin: Secondary | ICD-10-CM | POA: Diagnosis not present

## 2023-10-11 DIAGNOSIS — R079 Chest pain, unspecified: Secondary | ICD-10-CM | POA: Diagnosis not present

## 2023-10-11 DIAGNOSIS — Z992 Dependence on renal dialysis: Secondary | ICD-10-CM | POA: Diagnosis not present

## 2023-10-11 DIAGNOSIS — D631 Anemia in chronic kidney disease: Secondary | ICD-10-CM | POA: Diagnosis not present

## 2023-10-11 DIAGNOSIS — E1129 Type 2 diabetes mellitus with other diabetic kidney complication: Secondary | ICD-10-CM | POA: Diagnosis not present

## 2023-10-11 DIAGNOSIS — N186 End stage renal disease: Secondary | ICD-10-CM | POA: Diagnosis not present

## 2023-10-11 DIAGNOSIS — D508 Other iron deficiency anemias: Secondary | ICD-10-CM | POA: Diagnosis not present

## 2023-10-13 DIAGNOSIS — Z992 Dependence on renal dialysis: Secondary | ICD-10-CM | POA: Diagnosis not present

## 2023-10-13 DIAGNOSIS — R079 Chest pain, unspecified: Secondary | ICD-10-CM | POA: Diagnosis not present

## 2023-10-13 DIAGNOSIS — N2581 Secondary hyperparathyroidism of renal origin: Secondary | ICD-10-CM | POA: Diagnosis not present

## 2023-10-13 DIAGNOSIS — D508 Other iron deficiency anemias: Secondary | ICD-10-CM | POA: Diagnosis not present

## 2023-10-13 DIAGNOSIS — E1129 Type 2 diabetes mellitus with other diabetic kidney complication: Secondary | ICD-10-CM | POA: Diagnosis not present

## 2023-10-13 DIAGNOSIS — D631 Anemia in chronic kidney disease: Secondary | ICD-10-CM | POA: Diagnosis not present

## 2023-10-13 DIAGNOSIS — N186 End stage renal disease: Secondary | ICD-10-CM | POA: Diagnosis not present

## 2023-10-15 ENCOUNTER — Ambulatory Visit (INDEPENDENT_AMBULATORY_CARE_PROVIDER_SITE_OTHER): Payer: 59

## 2023-10-15 DIAGNOSIS — R079 Chest pain, unspecified: Secondary | ICD-10-CM

## 2023-10-15 DIAGNOSIS — Z9889 Other specified postprocedural states: Secondary | ICD-10-CM

## 2023-10-15 LAB — ECHOCARDIOGRAM COMPLETE
Area-P 1/2: 2.89 cm2
Calc EF: 48.8 %
MV VTI: 0.68 cm2
S' Lateral: 3.61 cm
Single Plane A2C EF: 53 %
Single Plane A4C EF: 46.1 %

## 2023-10-17 DIAGNOSIS — D631 Anemia in chronic kidney disease: Secondary | ICD-10-CM | POA: Diagnosis not present

## 2023-10-17 DIAGNOSIS — E1129 Type 2 diabetes mellitus with other diabetic kidney complication: Secondary | ICD-10-CM | POA: Diagnosis not present

## 2023-10-17 DIAGNOSIS — Z992 Dependence on renal dialysis: Secondary | ICD-10-CM | POA: Diagnosis not present

## 2023-10-17 DIAGNOSIS — R079 Chest pain, unspecified: Secondary | ICD-10-CM | POA: Diagnosis not present

## 2023-10-17 DIAGNOSIS — N186 End stage renal disease: Secondary | ICD-10-CM | POA: Diagnosis not present

## 2023-10-17 DIAGNOSIS — D508 Other iron deficiency anemias: Secondary | ICD-10-CM | POA: Diagnosis not present

## 2023-10-17 DIAGNOSIS — N2581 Secondary hyperparathyroidism of renal origin: Secondary | ICD-10-CM | POA: Diagnosis not present

## 2023-10-18 DIAGNOSIS — D508 Other iron deficiency anemias: Secondary | ICD-10-CM | POA: Diagnosis not present

## 2023-10-18 DIAGNOSIS — Z992 Dependence on renal dialysis: Secondary | ICD-10-CM | POA: Diagnosis not present

## 2023-10-18 DIAGNOSIS — N186 End stage renal disease: Secondary | ICD-10-CM | POA: Diagnosis not present

## 2023-10-18 DIAGNOSIS — N2581 Secondary hyperparathyroidism of renal origin: Secondary | ICD-10-CM | POA: Diagnosis not present

## 2023-10-18 DIAGNOSIS — E1129 Type 2 diabetes mellitus with other diabetic kidney complication: Secondary | ICD-10-CM | POA: Diagnosis not present

## 2023-10-18 DIAGNOSIS — D631 Anemia in chronic kidney disease: Secondary | ICD-10-CM | POA: Diagnosis not present

## 2023-10-18 DIAGNOSIS — R079 Chest pain, unspecified: Secondary | ICD-10-CM | POA: Diagnosis not present

## 2023-10-19 DIAGNOSIS — E1122 Type 2 diabetes mellitus with diabetic chronic kidney disease: Secondary | ICD-10-CM | POA: Diagnosis not present

## 2023-10-19 DIAGNOSIS — N186 End stage renal disease: Secondary | ICD-10-CM | POA: Diagnosis not present

## 2023-10-19 DIAGNOSIS — Z992 Dependence on renal dialysis: Secondary | ICD-10-CM | POA: Diagnosis not present

## 2023-10-20 DIAGNOSIS — D631 Anemia in chronic kidney disease: Secondary | ICD-10-CM | POA: Diagnosis not present

## 2023-10-20 DIAGNOSIS — D508 Other iron deficiency anemias: Secondary | ICD-10-CM | POA: Diagnosis not present

## 2023-10-20 DIAGNOSIS — Z992 Dependence on renal dialysis: Secondary | ICD-10-CM | POA: Diagnosis not present

## 2023-10-20 DIAGNOSIS — N2581 Secondary hyperparathyroidism of renal origin: Secondary | ICD-10-CM | POA: Diagnosis not present

## 2023-10-20 DIAGNOSIS — N186 End stage renal disease: Secondary | ICD-10-CM | POA: Diagnosis not present

## 2023-10-23 DIAGNOSIS — Z992 Dependence on renal dialysis: Secondary | ICD-10-CM | POA: Diagnosis not present

## 2023-10-23 DIAGNOSIS — N2581 Secondary hyperparathyroidism of renal origin: Secondary | ICD-10-CM | POA: Diagnosis not present

## 2023-10-23 DIAGNOSIS — N186 End stage renal disease: Secondary | ICD-10-CM | POA: Diagnosis not present

## 2023-10-23 DIAGNOSIS — D508 Other iron deficiency anemias: Secondary | ICD-10-CM | POA: Diagnosis not present

## 2023-10-23 DIAGNOSIS — D631 Anemia in chronic kidney disease: Secondary | ICD-10-CM | POA: Diagnosis not present

## 2023-10-25 DIAGNOSIS — D508 Other iron deficiency anemias: Secondary | ICD-10-CM | POA: Diagnosis not present

## 2023-10-25 DIAGNOSIS — D631 Anemia in chronic kidney disease: Secondary | ICD-10-CM | POA: Diagnosis not present

## 2023-10-25 DIAGNOSIS — N186 End stage renal disease: Secondary | ICD-10-CM | POA: Diagnosis not present

## 2023-10-25 DIAGNOSIS — N2581 Secondary hyperparathyroidism of renal origin: Secondary | ICD-10-CM | POA: Diagnosis not present

## 2023-10-25 DIAGNOSIS — Z992 Dependence on renal dialysis: Secondary | ICD-10-CM | POA: Diagnosis not present

## 2023-10-27 DIAGNOSIS — N186 End stage renal disease: Secondary | ICD-10-CM | POA: Diagnosis not present

## 2023-10-27 DIAGNOSIS — Z992 Dependence on renal dialysis: Secondary | ICD-10-CM | POA: Diagnosis not present

## 2023-10-27 DIAGNOSIS — D631 Anemia in chronic kidney disease: Secondary | ICD-10-CM | POA: Diagnosis not present

## 2023-10-27 DIAGNOSIS — D508 Other iron deficiency anemias: Secondary | ICD-10-CM | POA: Diagnosis not present

## 2023-10-27 DIAGNOSIS — N2581 Secondary hyperparathyroidism of renal origin: Secondary | ICD-10-CM | POA: Diagnosis not present

## 2023-10-30 DIAGNOSIS — N2581 Secondary hyperparathyroidism of renal origin: Secondary | ICD-10-CM | POA: Diagnosis not present

## 2023-10-30 DIAGNOSIS — D508 Other iron deficiency anemias: Secondary | ICD-10-CM | POA: Diagnosis not present

## 2023-10-30 DIAGNOSIS — Z992 Dependence on renal dialysis: Secondary | ICD-10-CM | POA: Diagnosis not present

## 2023-10-30 DIAGNOSIS — N186 End stage renal disease: Secondary | ICD-10-CM | POA: Diagnosis not present

## 2023-10-30 DIAGNOSIS — D631 Anemia in chronic kidney disease: Secondary | ICD-10-CM | POA: Diagnosis not present

## 2023-11-01 DIAGNOSIS — Z992 Dependence on renal dialysis: Secondary | ICD-10-CM | POA: Diagnosis not present

## 2023-11-01 DIAGNOSIS — D631 Anemia in chronic kidney disease: Secondary | ICD-10-CM | POA: Diagnosis not present

## 2023-11-01 DIAGNOSIS — D508 Other iron deficiency anemias: Secondary | ICD-10-CM | POA: Diagnosis not present

## 2023-11-01 DIAGNOSIS — N2581 Secondary hyperparathyroidism of renal origin: Secondary | ICD-10-CM | POA: Diagnosis not present

## 2023-11-01 DIAGNOSIS — N186 End stage renal disease: Secondary | ICD-10-CM | POA: Diagnosis not present

## 2023-11-02 ENCOUNTER — Telehealth (INDEPENDENT_AMBULATORY_CARE_PROVIDER_SITE_OTHER): Payer: Self-pay | Admitting: Primary Care

## 2023-11-02 ENCOUNTER — Ambulatory Visit (INDEPENDENT_AMBULATORY_CARE_PROVIDER_SITE_OTHER): Payer: Self-pay | Admitting: Primary Care

## 2023-11-02 NOTE — Telephone Encounter (Signed)
 Called pt to schedule appt. Please advise

## 2023-11-02 NOTE — Telephone Encounter (Signed)
 Will forward to provider  [name] can we schedule pt for next week open slot or double

## 2023-11-02 NOTE — Telephone Encounter (Signed)
 Copied from CRM [id number]. Topic: Clinical - Red Word Triage >> Nov 02, 2023 11:54 AM Gildardo Pounds wrote: Red Word that prompted transfer to Nurse Triage: knot on left thigh that hurts noticed a week ago. callback number is [phone]   Chief Complaint: Lump Symptoms: painful lump in front of thigh Frequency: constant Pertinent Negatives: Patient denies fall Disposition: [] ED /[x] Urgent Care (no appt availability in office) / [] Appointment(In office/virtual)/ []  [facility]/ [] Home Care/ [] Refused Recommended Disposition /[] [facility]/ []  Follow-up with PCP Additional Notes: Patient reports painful lump on front of thigh that started last week. No fever. RN advising UC since there are no avail appts at PCP within next 3 days. Pt seemed agreeable. Also, pt requesting refill of Nitroglycerin tabs. Denies chest pain or cardiac symptoms, sts she is just out of the medication. Refill request sent  Reason for Disposition  [1] Small swelling or lump AND [2] unexplained AND [3] present > 1 week  Answer Assessment - Initial Assessment Questions 1. APPEARANCE of SWELLING: "What does it look like?"     Loolks like bruise on front left thigh  2. SIZE: "How large is the swelling?" (e.g., inches, cm; or compare to size of pinhead, tip of pen, eraser, coin, pea, grape, ping pong ball)      Size of quarter  3. LOCATION: "Where is the swelling located?"     "A little"  4. ONSET: "When did the swelling start?"     Started last week  5. COLOR: "What color is it?" "Is there more than one color?"     "Purple-ish"  6. PAIN: "Is there any pain?" If Yes, ask: "How bad is the pain?" (e.g., scale 1-10; or mild, moderate, severe)     - NONE (0): no pain   - MILD (1-3): doesn't interfere with normal activities    - MODERATE (4-7): interferes with normal activities or awakens from sleep    - SEVERE (8-10): excruciating pain, unable to do any normal activities     6/10  7. ITCH: "Does it  itch?" If Yes, ask: "How bad is the itch?"      No  8. CAUSE: "What do you think caused the swelling?"  Unsure  9 OTHER SYMPTOMS: "Do you have any other symptoms?" (e.g., fever)     No  Protocols used: Skin Lump or Localized Swelling-A-AH

## 2023-11-03 DIAGNOSIS — D508 Other iron deficiency anemias: Secondary | ICD-10-CM | POA: Diagnosis not present

## 2023-11-03 DIAGNOSIS — D631 Anemia in chronic kidney disease: Secondary | ICD-10-CM | POA: Diagnosis not present

## 2023-11-03 DIAGNOSIS — N2581 Secondary hyperparathyroidism of renal origin: Secondary | ICD-10-CM | POA: Diagnosis not present

## 2023-11-03 DIAGNOSIS — N186 End stage renal disease: Secondary | ICD-10-CM | POA: Diagnosis not present

## 2023-11-03 DIAGNOSIS — Z992 Dependence on renal dialysis: Secondary | ICD-10-CM | POA: Diagnosis not present

## 2023-11-07 DIAGNOSIS — N186 End stage renal disease: Secondary | ICD-10-CM | POA: Diagnosis not present

## 2023-11-07 DIAGNOSIS — N2581 Secondary hyperparathyroidism of renal origin: Secondary | ICD-10-CM | POA: Diagnosis not present

## 2023-11-07 DIAGNOSIS — D508 Other iron deficiency anemias: Secondary | ICD-10-CM | POA: Diagnosis not present

## 2023-11-07 DIAGNOSIS — Z992 Dependence on renal dialysis: Secondary | ICD-10-CM | POA: Diagnosis not present

## 2023-11-07 DIAGNOSIS — D631 Anemia in chronic kidney disease: Secondary | ICD-10-CM | POA: Diagnosis not present

## 2023-11-08 ENCOUNTER — Telehealth (HOSPITAL_BASED_OUTPATIENT_CLINIC_OR_DEPARTMENT_OTHER): Payer: Self-pay | Admitting: Cardiology

## 2023-11-08 DIAGNOSIS — N2581 Secondary hyperparathyroidism of renal origin: Secondary | ICD-10-CM | POA: Diagnosis not present

## 2023-11-08 DIAGNOSIS — N186 End stage renal disease: Secondary | ICD-10-CM | POA: Diagnosis not present

## 2023-11-08 DIAGNOSIS — D631 Anemia in chronic kidney disease: Secondary | ICD-10-CM | POA: Diagnosis not present

## 2023-11-08 DIAGNOSIS — D508 Other iron deficiency anemias: Secondary | ICD-10-CM | POA: Diagnosis not present

## 2023-11-08 DIAGNOSIS — Z992 Dependence on renal dialysis: Secondary | ICD-10-CM | POA: Diagnosis not present

## 2023-11-08 MED ORDER — NITROGLYCERIN 0.4 MG SL SUBL: 0.4000 mg | SUBLINGUAL_TABLET | SUBLINGUAL | 12 refills | Status: AC | PRN

## 2023-11-08 NOTE — Telephone Encounter (Signed)
*  STAT* If patient is at the pharmacy, call can be transferred to refill team.   1. Which medications need to be refilled? (please list name of each medication and dose if known) new prescription for Nitroglycerin   2. Would you like to learn more about the convenience, safety, & potential cost savings by using the [facility]?     3. Are you open to using the [facility] (Type [facility].   4. Which pharmacy/location (including street and city if local pharmacy) is medication to be sent to?[facility] [address] [place]   5. Do they need a 30 day or 90 day supply?

## 2023-11-08 NOTE — Telephone Encounter (Signed)
Refill sent to local pharmacy.

## 2023-11-09 ENCOUNTER — Ambulatory Visit: Payer: Self-pay | Admitting: *Deleted

## 2023-11-09 ENCOUNTER — Other Ambulatory Visit: Payer: Self-pay

## 2023-11-09 ENCOUNTER — Encounter (HOSPITAL_COMMUNITY): Payer: Self-pay | Admitting: Emergency Medicine

## 2023-11-09 ENCOUNTER — Emergency Department (HOSPITAL_COMMUNITY)

## 2023-11-09 ENCOUNTER — Inpatient Hospital Stay (HOSPITAL_COMMUNITY)
Admission: EM | Admit: 2023-11-09 | Discharge: 2023-11-14 | DRG: 308 | Disposition: A | Discharge status: Home / Self Care | Attending: Internal Medicine | Admitting: Internal Medicine

## 2023-11-09 DIAGNOSIS — J449 Chronic obstructive pulmonary disease, unspecified: Secondary | ICD-10-CM | POA: Diagnosis present

## 2023-11-09 DIAGNOSIS — J441 Chronic obstructive pulmonary disease with (acute) exacerbation: Secondary | ICD-10-CM | POA: Diagnosis present

## 2023-11-09 DIAGNOSIS — Z8249 Family history of ischemic heart disease and other diseases of the circulatory system: Secondary | ICD-10-CM | POA: Diagnosis not present

## 2023-11-09 DIAGNOSIS — Z86718 Personal history of other venous thrombosis and embolism: Secondary | ICD-10-CM

## 2023-11-09 DIAGNOSIS — E785 Hyperlipidemia, unspecified: Secondary | ICD-10-CM

## 2023-11-09 DIAGNOSIS — Z7901 Long term (current) use of anticoagulants: Secondary | ICD-10-CM

## 2023-11-09 DIAGNOSIS — Z8616 Personal history of COVID-19: Secondary | ICD-10-CM | POA: Diagnosis not present

## 2023-11-09 DIAGNOSIS — R0989 Other specified symptoms and signs involving the circulatory and respiratory systems: Secondary | ICD-10-CM | POA: Diagnosis not present

## 2023-11-09 DIAGNOSIS — I5042 Chronic combined systolic (congestive) and diastolic (congestive) heart failure: Secondary | ICD-10-CM | POA: Diagnosis present

## 2023-11-09 DIAGNOSIS — Z6841 Body Mass Index (BMI) 40.0 and over, adult: Secondary | ICD-10-CM | POA: Diagnosis not present

## 2023-11-09 DIAGNOSIS — J069 Acute upper respiratory infection, unspecified: Secondary | ICD-10-CM | POA: Diagnosis present

## 2023-11-09 DIAGNOSIS — N186 End stage renal disease: Secondary | ICD-10-CM | POA: Diagnosis present

## 2023-11-09 DIAGNOSIS — F419 Anxiety disorder, unspecified: Secondary | ICD-10-CM | POA: Diagnosis present

## 2023-11-09 DIAGNOSIS — Z9049 Acquired absence of other specified parts of digestive tract: Secondary | ICD-10-CM

## 2023-11-09 DIAGNOSIS — I4891 Unspecified atrial fibrillation: Principal | ICD-10-CM | POA: Diagnosis present

## 2023-11-09 DIAGNOSIS — I82721 Chronic embolism and thrombosis of deep veins of right upper extremity: Secondary | ICD-10-CM | POA: Diagnosis present

## 2023-11-09 DIAGNOSIS — N250 Renal osteodystrophy: Secondary | ICD-10-CM | POA: Diagnosis not present

## 2023-11-09 DIAGNOSIS — Z79899 Other long term (current) drug therapy: Secondary | ICD-10-CM | POA: Diagnosis not present

## 2023-11-09 DIAGNOSIS — Z90710 Acquired absence of both cervix and uterus: Secondary | ICD-10-CM

## 2023-11-09 DIAGNOSIS — R079 Chest pain, unspecified: Secondary | ICD-10-CM | POA: Diagnosis not present

## 2023-11-09 DIAGNOSIS — E1122 Type 2 diabetes mellitus with diabetic chronic kidney disease: Secondary | ICD-10-CM | POA: Diagnosis present

## 2023-11-09 DIAGNOSIS — R231 Pallor: Secondary | ICD-10-CM | POA: Diagnosis not present

## 2023-11-09 DIAGNOSIS — G4733 Obstructive sleep apnea (adult) (pediatric): Secondary | ICD-10-CM

## 2023-11-09 DIAGNOSIS — I340 Nonrheumatic mitral (valve) insufficiency: Secondary | ICD-10-CM | POA: Diagnosis present

## 2023-11-09 DIAGNOSIS — Z803 Family history of malignant neoplasm of breast: Secondary | ICD-10-CM

## 2023-11-09 DIAGNOSIS — Z952 Presence of prosthetic heart valve: Secondary | ICD-10-CM

## 2023-11-09 DIAGNOSIS — I428 Other cardiomyopathies: Secondary | ICD-10-CM | POA: Diagnosis present

## 2023-11-09 DIAGNOSIS — Z882 Allergy status to sulfonamides status: Secondary | ICD-10-CM

## 2023-11-09 DIAGNOSIS — I10 Essential (primary) hypertension: Secondary | ICD-10-CM | POA: Diagnosis not present

## 2023-11-09 DIAGNOSIS — F32A Depression, unspecified: Secondary | ICD-10-CM | POA: Diagnosis present

## 2023-11-09 DIAGNOSIS — R109 Unspecified abdominal pain: Secondary | ICD-10-CM | POA: Diagnosis not present

## 2023-11-09 DIAGNOSIS — E1169 Type 2 diabetes mellitus with other specified complication: Secondary | ICD-10-CM | POA: Diagnosis present

## 2023-11-09 DIAGNOSIS — E1143 Type 2 diabetes mellitus with diabetic autonomic (poly)neuropathy: Secondary | ICD-10-CM | POA: Diagnosis present

## 2023-11-09 DIAGNOSIS — Z794 Long term (current) use of insulin: Secondary | ICD-10-CM | POA: Diagnosis not present

## 2023-11-09 DIAGNOSIS — Z91041 Radiographic dye allergy status: Secondary | ICD-10-CM

## 2023-11-09 DIAGNOSIS — Z91030 Bee allergy status: Secondary | ICD-10-CM

## 2023-11-09 DIAGNOSIS — I132 Hypertensive heart and chronic kidney disease with heart failure and with stage 5 chronic kidney disease, or end stage renal disease: Secondary | ICD-10-CM | POA: Diagnosis present

## 2023-11-09 DIAGNOSIS — Z992 Dependence on renal dialysis: Secondary | ICD-10-CM | POA: Diagnosis not present

## 2023-11-09 DIAGNOSIS — M109 Gout, unspecified: Secondary | ICD-10-CM | POA: Diagnosis present

## 2023-11-09 DIAGNOSIS — R000 Tachycardia, unspecified: Secondary | ICD-10-CM | POA: Diagnosis not present

## 2023-11-09 DIAGNOSIS — I517 Cardiomegaly: Secondary | ICD-10-CM | POA: Diagnosis not present

## 2023-11-09 DIAGNOSIS — D631 Anemia in chronic kidney disease: Secondary | ICD-10-CM | POA: Diagnosis not present

## 2023-11-09 DIAGNOSIS — K3184 Gastroparesis: Secondary | ICD-10-CM | POA: Diagnosis present

## 2023-11-09 DIAGNOSIS — I951 Orthostatic hypotension: Secondary | ICD-10-CM | POA: Diagnosis present

## 2023-11-09 DIAGNOSIS — I4819 Other persistent atrial fibrillation: Secondary | ICD-10-CM | POA: Diagnosis present

## 2023-11-09 DIAGNOSIS — Z9889 Other specified postprocedural states: Secondary | ICD-10-CM

## 2023-11-09 DIAGNOSIS — Z888 Allergy status to other drugs, medicaments and biological substances status: Secondary | ICD-10-CM

## 2023-11-09 DIAGNOSIS — I722 Aneurysm of renal artery: Secondary | ICD-10-CM | POA: Diagnosis not present

## 2023-11-09 DIAGNOSIS — R9431 Abnormal electrocardiogram [ECG] [EKG]: Secondary | ICD-10-CM | POA: Diagnosis present

## 2023-11-09 DIAGNOSIS — I700 Atherosclerosis of aorta: Secondary | ICD-10-CM | POA: Diagnosis not present

## 2023-11-09 DIAGNOSIS — R059 Cough, unspecified: Secondary | ICD-10-CM | POA: Diagnosis not present

## 2023-11-09 DIAGNOSIS — R0602 Shortness of breath: Secondary | ICD-10-CM | POA: Diagnosis not present

## 2023-11-09 DIAGNOSIS — K219 Gastro-esophageal reflux disease without esophagitis: Secondary | ICD-10-CM | POA: Diagnosis present

## 2023-11-09 DIAGNOSIS — E66813 Obesity, class 3: Secondary | ICD-10-CM | POA: Diagnosis present

## 2023-11-09 HISTORY — DX: Unspecified atrial fibrillation: I48.91

## 2023-11-09 LAB — RESP PANEL BY RT-PCR (RSV, FLU A&B, COVID)  RVPGX2
Influenza A by PCR: NEGATIVE
Influenza B by PCR: NEGATIVE
Resp Syncytial Virus by PCR: NEGATIVE
SARS Coronavirus 2 by RT PCR: NEGATIVE

## 2023-11-09 LAB — CBG MONITORING, ED: Glucose-Capillary: 126 mg/dL — ABNORMAL HIGH (ref 70–99)

## 2023-11-09 LAB — CBC
HCT: 29.7 % — ABNORMAL LOW (ref 36.0–46.0)
Hemoglobin: 9.4 g/dL — ABNORMAL LOW (ref 12.0–15.0)
MCH: 32 pg (ref 26.0–34.0)
MCHC: 31.6 g/dL (ref 30.0–36.0)
MCV: 101 fL — ABNORMAL HIGH (ref 80.0–100.0)
Platelets: 164 10*3/uL (ref 150–400)
RBC: 2.94 MIL/uL — ABNORMAL LOW (ref 3.87–5.11)
RDW: 15.5 % (ref 11.5–15.5)
WBC: 8.6 10*3/uL (ref 4.0–10.5)
nRBC: 0 % (ref 0.0–0.2)

## 2023-11-09 LAB — TROPONIN I (HIGH SENSITIVITY)
Troponin I (High Sensitivity): 9 ng/L (ref <18)
Troponin I (High Sensitivity): 10 ng/L (ref <18)

## 2023-11-09 LAB — BASIC METABOLIC PANEL
Anion gap: 18 — ABNORMAL HIGH (ref 5–15)
BUN: 22 mg/dL (ref 8–23)
CO2: 28 mmol/L (ref 22–32)
Calcium: 10.1 mg/dL (ref 8.9–10.3)
Chloride: 93 mmol/L — ABNORMAL LOW (ref 98–111)
Creatinine, Ser: 6.54 mg/dL — ABNORMAL HIGH (ref 0.44–1.00)
GFR, Estimated: 6 mL/min — ABNORMAL LOW (ref >60)
Glucose, Bld: 115 mg/dL — ABNORMAL HIGH (ref 70–99)
Potassium: 4.2 mmol/L (ref 3.5–5.1)
Sodium: 139 mmol/L (ref 135–145)

## 2023-11-09 LAB — BRAIN NATRIURETIC PEPTIDE: B Natriuretic Peptide: 278.7 pg/mL — ABNORMAL HIGH (ref 0.0–100.0)

## 2023-11-09 MED ORDER — DULOXETINE HCL 60 MG PO CPEP
60.0000 mg | ORAL_CAPSULE | Freq: Every day | ORAL | Status: DC
  Administered 2023-11-10 – 2023-11-14 (×5): 60 mg via ORAL
  Filled 2023-11-09 (×5): qty 1

## 2023-11-09 MED ORDER — PANTOPRAZOLE SODIUM 40 MG PO TBEC
40.0000 mg | DELAYED_RELEASE_TABLET | Freq: Every day | ORAL | Status: DC
  Administered 2023-11-10 – 2023-11-14 (×5): 40 mg via ORAL
  Filled 2023-11-09 (×5): qty 1

## 2023-11-09 MED ORDER — ROSUVASTATIN CALCIUM 5 MG PO TABS
10.0000 mg | ORAL_TABLET | Freq: Every day | ORAL | Status: DC
  Administered 2023-11-10 – 2023-11-14 (×5): 10 mg via ORAL
  Filled 2023-11-09 (×5): qty 2

## 2023-11-09 MED ORDER — APIXABAN 5 MG PO TABS
5.0000 mg | ORAL_TABLET | Freq: Two times a day (BID) | ORAL | Status: DC
  Administered 2023-11-09 – 2023-11-14 (×10): 5 mg via ORAL
  Filled 2023-11-09 (×10): qty 1

## 2023-11-09 MED ORDER — DILTIAZEM HCL 25 MG/5ML IV SOLN
20.0000 mg | Freq: Once | INTRAVENOUS | Status: AC
  Administered 2023-11-09: 10 mg via INTRAVENOUS
  Filled 2023-11-09: qty 5

## 2023-11-09 MED ORDER — ACETAMINOPHEN 650 MG RE SUPP: 650.0000 mg | Freq: Four times a day (QID) | RECTAL | Status: DC | PRN

## 2023-11-09 MED ORDER — METOCLOPRAMIDE HCL 5 MG PO TABS: 5.0000 mg | ORAL_TABLET | Freq: Three times a day (TID) | ORAL | Status: DC | PRN

## 2023-11-09 MED ORDER — INSULIN ASPART 100 UNIT/ML IJ SOLN: 0.0000 [IU] | Freq: Every day | INTRAMUSCULAR | Status: DC

## 2023-11-09 MED ORDER — AMIODARONE HCL IN DEXTROSE 360-4.14 MG/200ML-% IV SOLN
60.0000 mg/h | INTRAVENOUS | Status: DC
  Administered 2023-11-09 – 2023-11-10 (×2): 60 mg/h via INTRAVENOUS
  Filled 2023-11-09: qty 200

## 2023-11-09 MED ORDER — SODIUM CHLORIDE 0.9 % IV BOLUS
500.0000 mL | Freq: Once | INTRAVENOUS | Status: AC
  Administered 2023-11-09: 500 mL via INTRAVENOUS

## 2023-11-09 MED ORDER — AMIODARONE LOAD VIA INFUSION
150.0000 mg | Freq: Once | INTRAVENOUS | Status: AC
  Administered 2023-11-09: 150 mg via INTRAVENOUS
  Filled 2023-11-09: qty 83.34

## 2023-11-09 MED ORDER — INSULIN ASPART 100 UNIT/ML IJ SOLN
0.0000 [IU] | Freq: Three times a day (TID) | INTRAMUSCULAR | Status: DC
Start: 2023-11-10 — End: 2023-11-14
  Administered 2023-11-10: 2 [IU] via SUBCUTANEOUS
  Administered 2023-11-11: 3 [IU] via SUBCUTANEOUS
  Administered 2023-11-12 – 2023-11-13 (×3): 2 [IU] via SUBCUTANEOUS

## 2023-11-09 MED ORDER — ACETAMINOPHEN 325 MG PO TABS: 650.0000 mg | ORAL_TABLET | Freq: Four times a day (QID) | ORAL | Status: DC | PRN

## 2023-11-09 MED ORDER — AMIODARONE HCL IN DEXTROSE 360-4.14 MG/200ML-% IV SOLN
30.0000 mg/h | INTRAVENOUS | Status: DC
  Administered 2023-11-10 – 2023-11-13 (×8): 30 mg/h via INTRAVENOUS
  Filled 2023-11-09 (×7): qty 200

## 2023-11-09 MED ORDER — IPRATROPIUM-ALBUTEROL 0.5-2.5 (3) MG/3ML IN SOLN
3.0000 mL | Freq: Once | RESPIRATORY_TRACT | Status: AC
Start: 2023-11-09 — End: 2023-11-09
  Administered 2023-11-09: 3 mL via RESPIRATORY_TRACT
  Filled 2023-11-09: qty 3

## 2023-11-09 MED ORDER — OXYCODONE HCL 5 MG PO TABS: 5.0000 mg | ORAL_TABLET | Freq: Once | ORAL | Status: DC

## 2023-11-09 MED ORDER — PROCHLORPERAZINE EDISYLATE 10 MG/2ML IJ SOLN
5.0000 mg | INTRAMUSCULAR | Status: DC | PRN
  Administered 2023-11-10 – 2023-11-12 (×2): 5 mg via INTRAVENOUS
  Filled 2023-11-09 (×2): qty 2

## 2023-11-09 MED ORDER — FLUTICASONE PROPIONATE 50 MCG/ACT NA SUSP
2.0000 | Freq: Every day | NASAL | Status: DC | PRN
  Administered 2023-11-11: 2 via NASAL
  Filled 2023-11-09: qty 16

## 2023-11-09 MED ORDER — ACETAMINOPHEN 500 MG PO TABS
1000.0000 mg | ORAL_TABLET | Freq: Once | ORAL | Status: AC
  Administered 2023-11-09: 1000 mg via ORAL
  Filled 2023-11-09: qty 2

## 2023-11-09 MED ORDER — SENNOSIDES-DOCUSATE SODIUM 8.6-50 MG PO TABS
1.0000 | ORAL_TABLET | Freq: Every evening | ORAL | Status: DC | PRN
  Filled 2023-11-09: qty 1

## 2023-11-09 MED ORDER — GUAIFENESIN 100 MG/5ML PO LIQD
5.0000 mL | ORAL | Status: DC | PRN
  Administered 2023-11-10 – 2023-11-12 (×6): 5 mL via ORAL
  Filled 2023-11-09 (×6): qty 10

## 2023-11-09 MED ORDER — BUSPIRONE HCL 5 MG PO TABS
5.0000 mg | ORAL_TABLET | Freq: Two times a day (BID) | ORAL | Status: DC
  Administered 2023-11-10 – 2023-11-14 (×10): 5 mg via ORAL
  Filled 2023-11-09 (×10): qty 1

## 2023-11-09 MED ORDER — INSULIN GLARGINE 100 UNIT/ML ~~LOC~~ SOLN
10.0000 [IU] | Freq: Every day | SUBCUTANEOUS | Status: DC
  Administered 2023-11-10 – 2023-11-11 (×3): 10 [IU] via SUBCUTANEOUS
  Filled 2023-11-09 (×4): qty 0.1

## 2023-11-09 MED ORDER — METOPROLOL TARTRATE 12.5 MG HALF TABLET
12.5000 mg | ORAL_TABLET | Freq: Two times a day (BID) | ORAL | Status: DC
Start: 2023-11-09 — End: 2023-11-14
  Administered 2023-11-10 – 2023-11-14 (×8): 12.5 mg via ORAL
  Filled 2023-11-09 (×8): qty 1

## 2023-11-09 NOTE — H&P (Signed)
 PCP:   Grayce Sessions, NP   Chief Complaint:  Chest pain, shortness of breath  HPI: This is a 68 year old female with past medical history of ESRD [T/Th/Sat], atrial fibrillation, HTN, HLD, T2DM, COPD, DVT, OSA, asthma, NICM, sp TVR.  She presents with complaint of trouble breathing, coughing, chest pains since Wednesday.  Her shortness of breath has been getting progressively worse as has her chest pain.  She endorses palpitations, seizures and compliance with her medication.  She denies lightheadedness or dizziness.  Per patient yesterday told her that shortness of breath, the coughing, the chest pains.  She came to the ER.  In the ER presenting vitals 121/100, 123.  Troponin 9=>10, EKG A-fib with RVR heart rate 130, QTc 531.  Respiratory panel negative.  Patient given IV Cardizem without improvement.  Amiodarone drip initiated.  Cardiologist Dr. Regino Schultze consulted.  Review of Systems:  Per HPI  Past Medical History: Past Medical History:  Diagnosis Date   Acute on chronic systolic CHF (congestive heart failure) (HCC) 12/21/2020   Allergic rhinitis    Anemia    Arthritis    Asthma    Chronic diastolic CHF (congestive heart failure) (HCC)    Chronic kidney disease    Dialysis Tu/TH/Sa   COPD (chronic obstructive pulmonary disease) (HCC)    COVID-19 03/28/2023   Depression    DM (diabetes mellitus) (HCC)    DVT (deep vein thrombosis) in pregnancy    Gastroenteritis 03/27/2023   GERD (gastroesophageal reflux disease)    HTN (hypertension)    Hyperlipidemia    Obesity    Pneumonia 04/2018   RIGHT LOBE   Sleep apnea    needs to be retested - to get new cpap   Past Surgical History:  Procedure Laterality Date   ABDOMINAL HYSTERECTOMY     AV FISTULA PLACEMENT Left 03/17/2022   Procedure: LEFT ARM ARTERIOVENOUS (AV) FISTULA CREATION;  Surgeon: Chuck Hint, MD;  Location: [facility] OR;  Service: Vascular;  Laterality: Left;   BIOPSY  01/12/2023   Procedure: BIOPSY;  Surgeon:  Jeani Hawking, MD;  Location: [facility] ENDOSCOPY;  Service: Gastroenterology;;   Thressa Sheller STUDY  11/26/2020   Procedure: BUBBLE STUDY;  Surgeon: Jodelle Red, MD;  Location: [facility] ENDOSCOPY;  Service: Cardiovascular;;   CARDIOVERSION N/A 05/06/2020   Procedure: CARDIOVERSION;  Surgeon: Jodelle Red, MD;  Location: [facility] ENDOSCOPY;  Service: Cardiovascular;  Laterality: N/A;   CARDIOVERSION N/A 11/04/2020   Procedure: CARDIOVERSION;  Surgeon: Lewayne Bunting, MD;  Location: [facility] ENDOSCOPY;  Service: Cardiovascular;  Laterality: N/A;   CARDIOVERSION N/A 02/01/2021   Procedure: CARDIOVERSION;  Surgeon: Dolores Patty, MD;  Location: [facility] ENDOSCOPY;  Service: Cardiovascular;  Laterality: N/A;   CARDIOVERSION N/A 02/09/2021   Procedure: CARDIOVERSION;  Surgeon: Dolores Patty, MD;  Location: [facility] ENDOSCOPY;  Service: Cardiovascular;  Laterality: N/A;   CARDIOVERSION N/A 04/19/2021   Procedure: CARDIOVERSION;  Surgeon: Pricilla Riffle, MD;  Location: [facility] ENDOSCOPY;  Service: Cardiovascular;  Laterality: N/A;   CARDIOVERSION N/A 11/25/2021   Procedure: CARDIOVERSION;  Surgeon: Dolores Patty, MD;  Location: [facility] ENDOSCOPY;  Service: Cardiovascular;  Laterality: N/A;   CARDIOVERSION N/A 01/31/2022   Procedure: CARDIOVERSION;  Surgeon: Dolores Patty, MD;  Location: [facility] ENDOSCOPY;  Service: Cardiovascular;  Laterality: N/A;   CARDIOVERSION N/A 07/23/2023   Procedure: CARDIOVERSION (CATH LAB);  Surgeon: Thurmon Fair, MD;  Location: [facility] INVASIVE CV LAB;  Service: Cardiovascular;  Laterality: N/A;   CATARACT EXTRACTION, BILATERAL     CHOLECYSTECTOMY  COLONOSCOPY WITH PROPOFOL N/A 03/05/2015   Procedure: COLONOSCOPY WITH PROPOFOL;  Surgeon: Jeani Hawking, MD;  Location: [facility] ENDOSCOPY;  Service: Endoscopy;  Laterality: N/A;   DIAGNOSTIC LAPAROSCOPY     ESOPHAGOGASTRODUODENOSCOPY (EGD) WITH PROPOFOL N/A 01/12/2023   Procedure: ESOPHAGOGASTRODUODENOSCOPY (EGD) WITH PROPOFOL;  Surgeon: Jeani Hawking,  MD;  Location: [facility] ENDOSCOPY;  Service: Gastroenterology;  Laterality: N/A;   FISTULA SUPERFICIALIZATION Left 03/17/2022   Procedure: FISTULA SUPERFICIALIZATION -LEFT;  Surgeon: Chuck Hint, MD;  Location: [facility] OR;  Service: Vascular;  Laterality: Left;   ingrown hallux Left    IR FLUORO GUIDE CV LINE RIGHT  03/14/2022   IR US GUIDE VASC ACCESS RIGHT  03/14/2022   KNEE SURGERY     LEFT HEART CATH AND CORONARY ANGIOGRAPHY N/A 12/31/2017   Procedure: LEFT HEART CATH AND CORONARY ANGIOGRAPHY;  Surgeon: Rinaldo Cloud, MD;  Location: [facility]VASIVE CV LAB;  Service: Cardiovascular;  Laterality: N/A;   MAZE N/A 12/29/2020   Procedure: MAZE;  Surgeon: Loreli Slot, MD;  Location: [facility] OR;  Service: Open Heart Surgery;  Laterality: N/A;   MITRAL VALVE REPAIR N/A 12/29/2020   Procedure: MITRAL VALVE REPAIR USING CARBOMEDICS ANNULOFLEX RING SIZE 30;  Surgeon: Loreli Slot, MD;  Location: [facility] OR;  Service: Open Heart Surgery;  Laterality: N/A;   REVISON OF ARTERIOVENOUS FISTULA Left 01/05/2023   Procedure: REVISION OF LEFT ARTERIOVENOUS FISTULA WITH INTERPOSITION PTFE GRAFT;  Surgeon: Chuck Hint, MD;  Location: [facility] OR;  Service: Vascular;  Laterality: Left;  ELIQUIS AND HOLD 48 X HOURS   RIGHT HEART CATH N/A 12/22/2020   Procedure: RIGHT HEART CATH;  Surgeon: Laurey Morale, MD;  Location: [facility] INVASIVE CV LAB;  Service: Cardiovascular;  Laterality: N/A;   RIGHT HEART CATH N/A 01/31/2021   Procedure: RIGHT HEART CATH;  Surgeon: Dolores Patty, MD;  Location: [facility]VASIVE CV LAB;  Service: Cardiovascular;  Laterality: N/A;   RIGHT HEART CATH N/A 07/29/2021   Procedure: RIGHT HEART CATH;  Surgeon: Dolores Patty, MD;  Location: [facility]VAS[facility] CV LAB;  Service: Cardiovascular;  Laterality: N/A;   RIGHT HEART CATH N/A 11/24/2021   Procedure: RIGHT HEART CATH;  Surgeon: Dolores Patty, MD;  Location: [facility] CV LAB;  Service: Cardiovascular;  Laterality: N/A;   RIGHT HEART  CATH N/A 02/14/2022   Procedure: RIGHT HEART CATH;  Surgeon: Dolores Patty, MD;  Location: [facility]VAS[facility] CV LAB;  Service: Cardiovascular;  Laterality: N/A;   RIGHT HEART CATH N/A 02/22/2022   Procedure: RIGHT HEART CATH;  Surgeon: Laurey Morale, MD;  Location: [facility] INVASIVE CV LAB;  Service: Cardiovascular;  Laterality: N/A;   TEE WITHOUT CARDIOVERSION N/A 11/26/2020   Procedure: TRANSESOPHAGEAL ECHOCARDIOGRAM (TEE);  Surgeon: Jodelle Red, MD;  Location: [facility] ENDOSCOPY;  Service: Cardiovascular;  Laterality: N/A;   TEE WITHOUT CARDIOVERSION N/A 12/29/2020   Procedure: TRANSESOPHAGEAL ECHOCARDIOGRAM (TEE);  Surgeon: Loreli Slot, MD;  Location: [facility] OR;  Service: Open Heart Surgery;  Laterality: N/A;   TEE WITHOUT CARDIOVERSION N/A 01/20/2022   Procedure: TRANSESOPHAGEAL ECHOCARDIOGRAM (TEE);  Surgeon: Dolores Patty, MD;  Location: [facility] ENDOSCOPY;  Service: Cardiovascular;  Laterality: N/A;   TRICUSPID VALVE REPLACEMENT N/A 12/29/2020   Procedure: 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 RING SIZE 34;  Surgeon: Loreli Slot, MD;  Location: [facility] OR;  Service: Open Heart Surgery;  Laterality: N/A;    Medications: Prior to Admission medications   Medication Sig Start Date End Date Taking? Authorizing Provider  acetaminophen (  TYLENOL) 325 MG tablet Take 2 tablets (650 mg total) by mouth every 6 (six) hours as needed for mild pain or moderate pain. Patient taking differently: Take 650 mg by mouth as needed for mild pain (pain score 1-3) or moderate pain (pain score 4-6). 12/07/21   Arrien, York Ram, MD  albuterol (PROVENTIL HFA) 108 (90 Base) MCG/ACT inhaler Inhale 1-2 puffs into the lungs every 6 (six) hours as needed for wheezing or shortness of breath. 10/13/22   Grayce Sessions, NP  amiodarone (PACERONE) 200 MG tablet TAKE 1 TABLET BY MOUTH EVERY DAY 05/21/23   Bensimhon, Bevelyn Buckles, MD  Blood Glucose Monitoring Suppl (ACCU-CHEK GUIDE) w/Device KIT Use to  check blood sugar TID. 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 Dx: E11.69 Last reading: 105, 02-20-2023, Fasting. 06/28/22   Hoy Register, MD  busPIRone (BUSPAR) 5 MG tablet TAKE ONE TABLET BY MOUTH TWICE DAILY @ 9AM & 5PM 09/07/23   Grayce Sessions, NP  colchicine 0.6 MG tablet Take 1/2 (one-half) tablet by mouth once daily 11/23/22   Hoy Register, MD  diphenhydrAMINE (BENADRYL) 25 MG tablet Take 25 mg by mouth as needed. 03/31/22   [provider]  Doxercalciferol (HECTOROL IV) IV given on dialysis days 05/17/23 05/15/24  [provider]  DULoxetine (CYMBALTA) 60 MG capsule TAKE ONE CAPSULE (60MG  TOTAL) BY MOUTH DAILY AT 9AM 02/12/23   Grayce Sessions, NP  ELIQUIS 5 MG TABS tablet TAKE ONE TABLET (5MG  TOTAL) BY MOUTH TWICE DAILY @9AM -5PM 09/06/23   Bensimhon, Bevelyn Buckles, MD  EPINEPHrine 0.3 mg/0.3 mL IJ SOAJ injection Inject 0.3 mg into the muscle as needed for anaphylaxis. 06/25/20   Grayce Sessions, NP  fluticasone [facility] ALLERGY RELIEF) 50 MCG/ACT nasal spray Place 2 sprays into both nostrils as needed. 03/31/22   [provider]  glucose blood (ACCU-CHEK GUIDE) test strip USE TO CHECK BLOOD SUGAR 3 TIMES DAILY 04/03/22   Hoy Register, MD  insulin glargine (LANTUS SOLOSTAR) 100 UNIT/ML Solostar Pen Inject 10 Units into the skin daily. 09/27/23   Grayce Sessions, NP  insulin lispro (HUMALOG KWIKPEN) 100 UNIT/ML KwikPen For blood sugars 0-150 give 0 units of insulin, 151-200 give 2 units of insulin, 201-250 give 4 units, 251-300 give 6 units, 301-350 give 8 units, 351-400 give 10 units,> 400 give 12 units and call M.D. 09/27/23   Hoy Register, MD  lidocaine-prilocaine (EMLA) cream Apply 1 Application topically Every Tuesday,Thursday,and Saturday with dialysis. 06/01/22   [provider]  linaclotide (LINZESS) 72 MCG capsule Take 72 mcg by mouth as needed. 03/31/22   [provider]  loratadine (CLARITIN) 10 MG  tablet TAKE ONE TABLET BY MOUTH DAILY AT 9AM 08/02/23   Grayce Sessions, NP  meclizine (ANTIVERT) 25 MG tablet Take 1 tablet (25 mg total) by mouth 3 (three) times daily as needed for dizziness. Patient taking differently: Take 25 mg by mouth as needed for dizziness. 03/18/23   Jonah Blue, MD  metoCLOPramide (REGLAN) 5 MG tablet Take 1 tablet (5 mg total) by mouth every 8 (eight) hours as needed for nausea. 07/10/23   Grayce Sessions, NP  metoprolol tartrate (LOPRESSOR) 25 MG tablet Take 0.5 tablets (12.5 mg total) by mouth 2 (two) times daily. 03/30/23 07/16/23  Noralee Stain, DO  midodrine (PROAMATINE) 10 MG tablet Take 1 tablet (10 mg total) by mouth See admin instructions. Take 10mg  (1 tablet) by mouth on Tuesday's, Thursday's, and Saturday's. 03/30/23   Alvino Chapel,  Victorino Dike, DO  Multiple Vitamins-Minerals (MULTIVITAMIN WOMEN PO) Take 1 tablet by mouth daily.    [provider]  nitroGLYCERIN (NITROSTAT) 0.4 MG SL tablet Place 1 tablet (0.4 mg total) under the tongue every 5 (five) minutes x 3 doses as needed for chest pain. 11/08/23   Jodelle Red, MD  ondansetron (ZOFRAN) 8 MG tablet Take 8 mg by mouth 2 (two) times daily as needed. 06/10/23   [provider]  pantoprazole (PROTONIX) 40 MG tablet Take 1 tablet (40 mg total) by mouth daily. 04/24/23   Grayce Sessions, NP  polyethylene glycol powder (MIRALAX) 17 GM/SCOOP powder Take by mouth as needed. 06/26/22   [provider]  rosuvastatin (CRESTOR) 10 MG tablet TAKE ONE TABLET BY MOUTH DAILY AT 5PM 04/13/23   Bensimhon, Bevelyn Buckles, MD    Allergies:   Allergies  Allergen Reactions   Bee Pollen Anaphylaxis and Other (See Comments)    UNCONFIRMED, but IS allergic to bee VENOM   Bee Venom Anaphylaxis   Sulfa Antibiotics Itching   Chlorhexidine    Iodinated Contrast Media Nausea And Vomiting    Social History:  reports that she has never smoked. She has never been exposed to tobacco smoke. She has  never used smokeless tobacco. She reports that she does not drink alcohol and does not use drugs.  Family History: Family History  Problem Relation Age of Onset   Breast cancer Mother    Cancer Mother    Hypertension Mother    Cancer Father    Hypertension Sister    Hypertension Brother    CAD Other     Physical Exam: Vitals:   11/09/23 2100 11/09/23 2115 11/09/23 2130 11/09/23 2145  BP: (!) 79/68 116/76 126/87 113/89  Pulse: (!) 107 (!) 144 (!) 145 (!) 114  Resp: (!) 22 20 (!) 26 (!) 35  Temp:      TempSrc:      SpO2: 97% 100% 97% 100%    General:  Alert &Ox3, obese female, no acute distress Eyes: Pink conjunctiva, no scleral icterus ENT: Moist oral mucosa Lungs: CTA B/L, no wheeze, no crackles, no use of accessory muscles Cardiovascular: Tachycardia, irregular rate and rhythm, no murmurs. no JVD.  Very reproducible generalized chest wall pain Abdomen: soft, positive BS, NTND, not an acute abdomen GU: not examined Neuro: CN II - XII appears grossly intact Musculoskeletal: Moves all extremity, chronic lower extremity edema Skin: no rash, no subcutaneous crepitation Psych: appropriate patient  Labs on Admission:  Recent Labs    11/09/23 1634  NA 139  K 4.2  CL 93*  CO2 28  GLUCOSE 115*  BUN 22  CREATININE 6.54*  CALCIUM 10.1    Recent Labs    11/09/23 1634  WBC 8.6  HGB 9.4*  HCT 29.7*  MCV 101.0*  PLT 164    Micro Results: Recent Results (from the past 240 hours)  Resp panel by RT-PCR (RSV, Flu A&B, Covid) Anterior Nasal Swab     Status: None   Collection Time: 11/09/23  4:34 PM   Specimen: Anterior Nasal Swab  Result Value Ref Range Status   SARS Coronavirus 2 by RT PCR NEGATIVE NEGATIVE Final   Influenza A by PCR NEGATIVE NEGATIVE Final   Influenza B by PCR NEGATIVE NEGATIVE Final    Comment: (NOTE) The Xpert Xpress SARS-CoV-2/FLU/RSV plus assay is intended as an aid in the diagnosis of influenza from Nasopharyngeal swab specimens and should  not be used as a sole basis  for treatment. Nasal washings and aspirates are unacceptable for Xpert Xpress SARS-CoV-2/FLU/RSV testing.  Fact Sheet for Patients: BloggerCourse.com  Fact Sheet for Healthcare Providers: SeriousBroker.it  This test is not yet approved or cleared by the [place] FDA and has been authorized for detection and/or diagnosis of SARS-CoV-2 by FDA under an Emergency Use Authorization (EUA). This EUA will remain in effect (meaning this test can be used) for the duration of the COVID-19 declaration under Section 564(b)(1) of the Act, 21 U.S.C. section 360bbb-3(b)(1), unless the authorization is terminated or revoked.     Resp Syncytial Virus by PCR NEGATIVE NEGATIVE Final    Comment: (NOTE) Fact Sheet for Patients: BloggerCourse.com  Fact Sheet for Healthcare Providers: SeriousBroker.it  This test is not yet approved or cleared by the [place] FDA and has been authorized for detection and/or diagnosis of SARS-CoV-2 by FDA under an Emergency Use Authorization (EUA). This EUA will remain in effect (meaning this test can be used) for the duration of the COVID-19 declaration under Section 564(b)(1) of the Act, 21 U.S.C. section 360bbb-3(b)(1), unless the authorization is terminated or revoked.  Performed at [facility], [address] [place], [place] [address]      Radiological Exams on Admission: CT CHEST ABDOMEN PELVIS WO CONTRAST Result Date: 11/09/2023 CLINICAL DATA:  Chest and abdominal pain EXAM: CT CHEST, ABDOMEN AND PELVIS WITHOUT CONTRAST TECHNIQUE: Multidetector CT imaging of the chest, abdomen and pelvis was performed following the standard protocol without IV contrast. RADIATION DOSE REDUCTION: This exam was performed according to the departmental dose-optimization program which includes automated exposure control, adjustment of the  mA and/or kV according to patient size and/or use of iterative reconstruction technique. COMPARISON:  06/18/2023 FINDINGS: CT CHEST FINDINGS Cardiovascular: Somewhat limited due to lack of IV contrast. Atherosclerotic calcifications of the aorta are noted. No aneurysmal dilatation is seen. Prior left atrial clipping is noted. Prior mitral and tricuspid valve surgery is noted. Mediastinum/Nodes: Thoracic inlet is within normal limits. No sizable hilar or mediastinal adenopathy is noted. The esophagus as visualized is within normal limits. Lungs/Pleura: Lungs are well aerated bilaterally. Areas of mosaic attenuation are noted consistent with air trapping. No focal infiltrate or sizable effusion is seen. No parenchymal nodule is noted. Musculoskeletal: Degenerative changes of the thoracic spine are noted. Chronic T11 compression deformity is noted. CT ABDOMEN PELVIS FINDINGS Hepatobiliary: No focal liver abnormality is seen. Status post cholecystectomy. No biliary dilatation. Pancreas: Unremarkable. No pancreatic ductal dilatation or surrounding inflammatory changes. Spleen: Normal in size without focal abnormality. Adrenals/Urinary Tract: Adrenal glands are within normal limits. Kidneys are well visualize without evidence of renal calculi. 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 Bladder is decompressed. Stomach/Bowel: Appendix is within normal limits. No obstructive or inflammatory changes of the colon are seen. Stomach and small bowel are unremarkable. Vascular/Lymphatic: Aortic atherosclerosis. No enlarged abdominal or pelvic lymph nodes. Reproductive: Status post hysterectomy. No adnexal masses. Other: No abdominal wall hernia or abnormality. No abdominopelvic ascites. Musculoskeletal: No acute or significant osseous findings. IMPRESSION: CT of the chest: Changes consistent with air trapping. No acute abnormality is noted. Chronic T2-11 compression  fracture. CT of the abdomen and pelvis: No acute abnormality noted. 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 These of been stable over multiple previous exams. Electronically Signed   By: Alcide Clever M.D.   On: 11/09/2023 20:28   DG Chest 2 View Result Date: 11/09/2023 CLINICAL DATA:  Chest  pain.  Cough since Wednesday. EXAM: CHEST - 2 VIEW COMPARISON:  07/03/2023 FINDINGS: Lateral view degraded by patient arm position. Lower thoracic mild compression deformity is of indeterminate acuity. Prior median sternotomy. Mitral valve repair. Left atrial appendage occlusion device. Moderate to marked cardiomegaly. No pneumothorax. No pleural fluid. Suspect mild pulmonary venous congestion without overt congestive failure. Atherosclerosis in the transverse aorta. IMPRESSION: Cardiomegaly with mild pulmonary venous congestion. Aortic Atherosclerosis (ICD10-I70.0). Electronically Signed   By: Jeronimo Greaves M.D.   On: 11/09/2023 18:21    Assessment/Plan Present on Admission:  Atrial fibrillation with RVR (HCC) -On amiodarone drip -Continue Eliquis, metoprolol, -Persistent tachycardia, heart rate 130s on amnio drip.  Single dose digoxin 0.25 mg given -Cardiology Dr. Regino Schultze consulted and aware by EDP   Anxiety and depression -BuSpar, Cymbalta   Chronic deep vein thrombosis (DVT) of other vein of right upper extremity  -Eliquis continued   COPD (chronic obstructive pulmonary disease) (HCC) -Flonase, inhaler as needed   Essential hypertension //  NICM //  sp TVR //  sp AVR -Metoprolol resumed -Eliquis resumed   HLD -Crestor resumed   T2DM //  gastroparesis -Sliding scale insulin ordered -Lantus 10 units subcu daily ordered -Continue Reglan   ESRD //  orthostatic hypotension -Nephrologist Dr. Juel Burrow consulted via epic chat -Continue midodrine as needed   OSA -CPAP ordered   Gout -Colchicine resumed   QT prolongation  Betzaida Cremeens 11/09/2023, 10:28 PM

## 2023-11-09 NOTE — Final Consult Note (Signed)
 Cardiology Consultation   Patient ID: [name] MRN: [id number]; DOB: [birth year]  Admit date: 11/09/2023 Date of Consult: 11/09/2023  PCP:  Grayce Sessions, NP   [facility] Providers Cardiologist:  Jodelle Red, MD  Electrophysiologist:  Lanier Prude, MD       Patient Profile:   [name] is a 68 y.o. female with a hx of ESRD on HD who is being seen 11/09/2023 for the evaluation of atrial fibrillation at the request of ED.  History of Present Illness:   [name] reports leaving dialysis yesterday and suddenly feeling back with hard, non productive cough, myalgia, lightheadedness, and URI symptoms.  Feels her chest hurts mostly on the L side, describes as pressure sensation and unable to take a deep breath. Worse with coughing, better sometimes if she lifts up her head.  In ED, she was noted to be back in ED, at home she is on PO metoprolol and amiodarone. HR up to 150s occasionally, BP initially low but now in the low 100s systolic range after gentle hydration.    ECG shows Afib BNP 280 - last 153 in 02/2023, legs feel a bit more swollen than usual Troponins negative  H/O systolic and diastolic dysfunction in the setting of corrected valvular heart disease, last echo with EF 50% range, s/p mitral and tricuspid repairs without MR, but with mod TR.  Quite dilated atria.  Has been cardioverted in the past, but often in recurrent AF, last DCCV was 07/23/23, was in SR in clinic on 1//31/2025. On DOAC, has had prior MAZE, and tried tikosyn in the past.    Past Medical History:  Diagnosis Date   Acute on chronic systolic CHF (congestive heart failure) (HCC) 12/21/2020   Allergic rhinitis    Anemia    Arthritis    Asthma    Chronic diastolic CHF (congestive heart failure) (HCC)    Chronic kidney disease    Dialysis Tu/TH/Sa   COPD (chronic obstructive pulmonary disease) (HCC)    COVID-19 03/28/2023   Depression     DM (diabetes mellitus) (HCC)    DVT (deep vein thrombosis) in pregnancy    Gastroenteritis 03/27/2023   GERD (gastroesophageal reflux disease)    HTN (hypertension)    Hyperlipidemia    Obesity    Pneumonia 04/2018   RIGHT LOBE   Sleep apnea    needs to be retested - to get new cpap    Past Surgical History:  Procedure Laterality Date   ABDOMINAL HYSTERECTOMY     AV FISTULA PLACEMENT Left 03/17/2022   Procedure: LEFT ARM ARTERIOVENOUS (AV) FISTULA CREATION;  Surgeon: Chuck Hint, MD;  Location: [facility] OR;  Service: Vascular;  Laterality: Left;   BIOPSY  01/12/2023   Procedure: BIOPSY;  Surgeon: Jeani Hawking, MD;  Location: [facility] ENDOSCOPY;  Service: Gastroenterology;;   Thressa Sheller STUDY  11/26/2020   Procedure: BUBBLE STUDY;  Surgeon: Jodelle Red, MD;  Location: [facility] ENDOSCOPY;  Service: Cardiovascular;;   CARDIOVERSION N/A 05/06/2020   Procedure: CARDIOVERSION;  Surgeon: Jodelle Red, MD;  Location: [facility] ENDOSCOPY;  Service: Cardiovascular;  Laterality: N/A;   CARDIOVERSION N/A 11/04/2020   Procedure: CARDIOVERSION;  Surgeon: Lewayne Bunting, MD;  Location: [facility] ENDOSCOPY;  Service: Cardiovascular;  Laterality: N/A;   CARDIOVERSION N/A 02/01/2021   Procedure: CARDIOVERSION;  Surgeon: Dolores Patty, MD;  Location: [facility] ENDOSCOPY;  Service: Cardiovascular;  Laterality: N/A;   CARDIOVERSION N/A 02/09/2021   Procedure: CARDIOVERSION;  Surgeon: Bensimhon,  Bevelyn Buckles, MD;  Location: [facility] ENDOSCOPY;  Service: Cardiovascular;  Laterality: N/A;   CARDIOVERSION N/A 04/19/2021   Procedure: CARDIOVERSION;  Surgeon: Pricilla Riffle, MD;  Location: [facility] ENDOSCOPY;  Service: Cardiovascular;  Laterality: N/A;   CARDIOVERSION N/A 11/25/2021   Procedure: CARDIOVERSION;  Surgeon: Dolores Patty, MD;  Location: [facility] ENDOSCOPY;  Service: Cardiovascular;  Laterality: N/A;   CARDIOVERSION N/A 01/31/2022   Procedure: CARDIOVERSION;  Surgeon: Dolores Patty, MD;  Location: [facility] ENDOSCOPY;   Service: Cardiovascular;  Laterality: N/A;   CARDIOVERSION N/A 07/23/2023   Procedure: CARDIOVERSION (CATH LAB);  Surgeon: Thurmon Fair, MD;  Location: [facility] INVASIVE CV LAB;  Service: Cardiovascular;  Laterality: N/A;   CATARACT EXTRACTION, BILATERAL     CHOLECYSTECTOMY     COLONOSCOPY WITH PROPOFOL N/A 03/05/2015   Procedure: COLONOSCOPY WITH PROPOFOL;  Surgeon: Jeani Hawking, MD;  Location: [facility] ENDOSCOPY;  Service: Endoscopy;  Laterality: N/A;   DIAGNOSTIC LAPAROSCOPY     ESOPHAGOGASTRODUODENOSCOPY (EGD) WITH PROPOFOL N/A 01/12/2023   Procedure: ESOPHAGOGASTRODUODENOSCOPY (EGD) WITH PROPOFOL;  Surgeon: Jeani Hawking, MD;  Location: [facility] ENDOSCOPY;  Service: Gastroenterology;  Laterality: N/A;   FISTULA SUPERFICIALIZATION Left 03/17/2022   Procedure: FISTULA SUPERFICIALIZATION -LEFT;  Surgeon: Chuck Hint, MD;  Location: [facility] OR;  Service: Vascular;  Laterality: Left;   ingrown hallux Left    IR FLUORO GUIDE CV LINE RIGHT  03/14/2022   IR US GUIDE VASC ACCESS RIGHT  03/14/2022   KNEE SURGERY     LEFT HEART CATH AND CORONARY ANGIOGRAPHY N/A 12/31/2017   Procedure: LEFT HEART CATH AND CORONARY ANGIOGRAPHY;  Surgeon: Rinaldo Cloud, MD;  Location: [facility] INVASIVE CV LAB;  Service: Cardiovascular;  Laterality: N/A;   MAZE N/A 12/29/2020   Procedure: MAZE;  Surgeon: Loreli Slot, MD;  Location: [facility] OR;  Service: Open Heart Surgery;  Laterality: N/A;   MITRAL VALVE REPAIR N/A 12/29/2020   Procedure: MITRAL VALVE REPAIR USING CARBOMEDICS ANNULOFLEX RING SIZE 30;  Surgeon: Loreli Slot, MD;  Location: [facility] OR;  Service: Open Heart Surgery;  Laterality: N/A;   REVISON OF ARTERIOVENOUS FISTULA Left 01/05/2023   Procedure: REVISION OF LEFT ARTERIOVENOUS FISTULA WITH INTERPOSITION PTFE GRAFT;  Surgeon: Chuck Hint, MD;  Location: [facility] OR;  Service: Vascular;  Laterality: Left;  ELIQUIS AND HOLD 48 X HOURS   RIGHT HEART CATH N/A 12/22/2020   Procedure: RIGHT HEART CATH;  Surgeon: Laurey Morale, MD;  Location: [facility] INVASIVE CV LAB;  Service: Cardiovascular;  Laterality: N/A;   RIGHT HEART CATH N/A 01/31/2021   Procedure: RIGHT HEART CATH;  Surgeon: Dolores Patty, MD;  Location: [facility] INVASIVE CV LAB;  Service: Cardiovascular;  Laterality: N/A;   RIGHT HEART CATH N/A 07/29/2021   Procedure: RIGHT HEART CATH;  Surgeon: Dolores Patty, MD;  Location: [facility] INVASIVE CV LAB;  Service: Cardiovascular;  Laterality: N/A;   RIGHT HEART CATH N/A 11/24/2021   Procedure: RIGHT HEART CATH;  Surgeon: Dolores Patty, MD;  Location: [facility] INVASIVE CV LAB;  Service: Cardiovascular;  Laterality: N/A;   RIGHT HEART CATH N/A 02/14/2022   Procedure: RIGHT HEART CATH;  Surgeon: Dolores Patty, MD;  Location: [facility] INVASIVE CV LAB;  Service: Cardiovascular;  Laterality: N/A;   RIGHT HEART CATH N/A 02/22/2022   Procedure: RIGHT HEART CATH;  Surgeon: Laurey Morale, MD;  Location: [facility] INVASIVE CV LAB;  Service: Cardiovascular;  Laterality: N/A;   TEE WITHOUT CARDIOVERSION N/A 11/26/2020   Procedure: TRANSESOPHAGEAL ECHOCARDIOGRAM (TEE);  Surgeon: Jodelle Red,  MD;  Location: [facility] ENDOSCOPY;  Service: Cardiovascular;  Laterality: N/A;   TEE WITHOUT CARDIOVERSION N/A 12/29/2020   Procedure: TRANSESOPHAGEAL ECHOCARDIOGRAM (TEE);  Surgeon: Loreli Slot, MD;  Location: [facility] OR;  Service: Open Heart Surgery;  Laterality: N/A;   TEE WITHOUT CARDIOVERSION N/A 01/20/2022   Procedure: TRANSESOPHAGEAL ECHOCARDIOGRAM (TEE);  Surgeon: Dolores Patty, MD;  Location: [facility] ENDOSCOPY;  Service: Cardiovascular;  Laterality: N/A;   TRICUSPID VALVE REPLACEMENT N/A 12/29/2020   Procedure: 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 RING SIZE 34;  Surgeon: Loreli Slot, MD;  Location: [facility] OR;  Service: Open Heart Surgery;  Laterality: N/A;       Inpatient Medications: Scheduled Meds:  apixaban  5 mg Oral BID   [START ON 11/10/2023] insulin aspart  0-15 Units Subcutaneous TID WC   insulin  aspart  0-5 Units Subcutaneous QHS   Continuous Infusions:  amiodarone 60 mg/hr (11/09/23 2138)   Followed by   Melene Muller ON 11/10/2023] amiodarone     PRN Meds: acetaminophen **OR** acetaminophen, prochlorperazine, senna-docusate  Allergies:    Allergies  Allergen Reactions   Bee Pollen Anaphylaxis and Other (See Comments)    UNCONFIRMED, but IS allergic to bee VENOM   Bee Venom Anaphylaxis   Sulfa Antibiotics Itching   Chlorhexidine    Iodinated Contrast Media Nausea And Vomiting    Social History:   Social History   Socioeconomic History   Marital status: Widowed    Spouse name: Not on file   Number of children: 2   Years of education: Not on file   Highest education level: Some college, no degree  Occupational History   Not on file  Tobacco Use   Smoking status: Never    Passive exposure: Never   Smokeless tobacco: Never   Tobacco comments:    Never smoke 10/20/22  Vaping Use   Vaping status: Never Used  Substance and Sexual Activity   Alcohol use: No   Drug use: No   Sexual activity: Not Currently  Other Topics Concern   Not on file  Social History Narrative   Pt lives in [place] with spouse.  2 grown children.   Previously worked in [employer] at [employer].  Now on disability   Social Drivers of Health   Financial Resource Strain: Low Risk  (06/04/2023)   Overall Financial Resource Strain (CARDIA)    Difficulty of Paying Living Expenses: Not hard at all  Food Insecurity: No Food Insecurity (06/04/2023)   Hunger Vital Sign    Worried About Running Out of Food in the Last Year: Never true    Ran Out of Food in the Last Year: Never true  Transportation Needs: No Transportation Needs (06/04/2023)   PRAPARE - Administrator, Civil Service (Medical): No    Lack of Transportation (Non-Medical): No  Physical Activity: Insufficiently Active (06/04/2023)   Exercise Vital Sign    Days of Exercise per Week: 2 days    Minutes of Exercise per Session:  20 min  Stress: No Stress Concern Present (06/04/2023)   [employer] of Occupational Health - Occupational Stress Questionnaire    Feeling of Stress : Only a little  Social Connections: Moderately Isolated (06/04/2023)   Social Connection and Isolation Panel [NHANES]    Frequency of Communication with Friends and Family: More than three times a week    Frequency of Social Gatherings with Friends and Family: More than three times a week    Attends Religious Services: More  than 4 times per year    Active Member of Clubs or Organizations: No    Attends Banker Meetings: Never    Marital Status: Widowed  Intimate Partner Violence: Not At Risk (06/04/2023)   Humiliation, Afraid, Rape, and Kick questionnaire    Fear of Current or Ex-Partner: No    Emotionally Abused: No    Physically Abused: No    Sexually Abused: No    Family History:    Family History  Problem Relation Age of Onset   Breast cancer Mother    Cancer Mother    Hypertension Mother    Cancer Father    Hypertension Sister    Hypertension Brother    CAD Other      ROS:  Please see the history of present illness.   All other ROS reviewed and negative.     Physical Exam/Data:   Vitals:   11/09/23 2145 11/09/23 2200 11/09/23 2215 11/09/23 2230  BP: 113/89 104/68 107/74 101/68  Pulse: (!) 114 (!) 142 (!) 139 (!) 117  Resp: (!) 35 (!) 29 (!) 22 (!) 22  Temp:      TempSrc:      SpO2: 100% 100% 97% 96%   No intake or output data in the 24 hours ending 11/09/23 2255    09/21/2023    8:51 AM 07/23/2023    7:00 AM 07/16/2023   11:46 AM  Last 3 Weights  Weight (lbs) 271 lb 265 lb 274 lb 9.6 oz  Weight (kg) 122.925 kg 120.203 kg 124.558 kg     There is no height or weight on file to calculate BMI.  General:  Well nourished, well developed, in mild distress HEENT: normal Neck: unable to visualize JVD Vascular: Distal pulses 1+ bilaterally Cardiac:  normal S1, S2; irregular, no murmur  Lungs:   clear to auscultation bilaterally, no wheezing, rhonchi or rales  Abd: soft, nontender, no hepatomegaly  Ext: 2+ B ankle edema Musculoskeletal:  No deformities, BUE and BLE strength normal and equal Skin: warm and dry  Neuro:  CNs 2-12 intact, no focal abnormalities noted  Grimaces/moans when I palpate her L chest.  EKG:  The EKG was personally reviewed and demonstrates:  AF with RVR Telemetry:  Telemetry was personally reviewed and demonstrates:  AF with RVR  Relevant CV Studies: As above  Laboratory Data:  High Sensitivity Troponin:   Recent Labs  Lab 11/09/23 1634 11/09/23 1820  TROPONINIHS 9 10     Chemistry Recent Labs  Lab 11/09/23 1634  NA 139  K 4.2  CL 93*  CO2 28  GLUCOSE 115*  BUN 22  CREATININE 6.54*  CALCIUM 10.1  GFRNONAA 6*  ANIONGAP 18*    Hematology Recent Labs  Lab 11/09/23 1634  WBC 8.6  RBC 2.94*  HGB 9.4*  HCT 29.7*  MCV 101.0*  MCH 32.0  MCHC 31.6  RDW 15.5  PLT 164   Thyroid No results for input(s): "TSH", "FREET4" in the last 168 hours.  BNP Recent Labs  Lab 11/09/23 1634  BNP 278.7*    DDimer No results for input(s): "DDIMER" in the last 168 hours.   Radiology/Studies:  CT CHEST ABDOMEN PELVIS WO CONTRAST Result Date: 11/09/2023 CLINICAL DATA:  Chest and abdominal pain EXAM: CT CHEST, ABDOMEN AND PELVIS WITHOUT CONTRAST TECHNIQUE: Multidetector CT imaging of the chest, abdomen and pelvis was performed following the standard protocol without IV contrast. RADIATION DOSE REDUCTION: This exam was performed according to the departmental dose-optimization program  which includes automated exposure control, adjustment of the mA and/or kV according to patient size and/or use of iterative reconstruction technique. COMPARISON:  06/18/2023 FINDINGS: CT CHEST FINDINGS Cardiovascular: Somewhat limited due to lack of IV contrast. Atherosclerotic calcifications of the aorta are noted. No aneurysmal dilatation is seen. Prior left atrial  clipping is noted. Prior mitral and tricuspid valve surgery is noted. Mediastinum/Nodes: Thoracic inlet is within normal limits. No sizable hilar or mediastinal adenopathy is noted. The esophagus as visualized is within normal limits. Lungs/Pleura: Lungs are well aerated bilaterally. Areas of mosaic attenuation are noted consistent with air trapping. No focal infiltrate or sizable effusion is seen. No parenchymal nodule is noted. Musculoskeletal: Degenerative changes of the thoracic spine are noted. Chronic T11 compression deformity is noted. CT ABDOMEN PELVIS FINDINGS Hepatobiliary: No focal liver abnormality is seen. Status post cholecystectomy. No biliary dilatation. Pancreas: Unremarkable. No pancreatic ductal dilatation or surrounding inflammatory changes. Spleen: Normal in size without focal abnormality. Adrenals/Urinary Tract: Adrenal glands are within normal limits. Kidneys are well visualize without evidence of renal calculi. 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 Bladder is decompressed. Stomach/Bowel: Appendix is within normal limits. No obstructive or inflammatory changes of the colon are seen. Stomach and small bowel are unremarkable. Vascular/Lymphatic: Aortic atherosclerosis. No enlarged abdominal or pelvic lymph nodes. Reproductive: Status post hysterectomy. No adnexal masses. Other: No abdominal wall hernia or abnormality. No abdominopelvic ascites. Musculoskeletal: No acute or significant osseous findings. IMPRESSION: CT of the chest: Changes consistent with air trapping. No acute abnormality is noted. Chronic T2-11 compression fracture. CT of the abdomen and pelvis: No acute abnormality noted. 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 These of been stable over multiple previous exams. Electronically Signed   By: Alcide Clever M.D.   On: 11/09/2023 20:28   DG Chest 2 View Result Date: 11/09/2023 CLINICAL DATA:   Chest pain.  Cough since Wednesday. EXAM: CHEST - 2 VIEW COMPARISON:  07/03/2023 FINDINGS: Lateral view degraded by patient arm position. Lower thoracic mild compression deformity is of indeterminate acuity. Prior median sternotomy. Mitral valve repair. Left atrial appendage occlusion device. Moderate to marked cardiomegaly. No pneumothorax. No pleural fluid. Suspect mild pulmonary venous congestion without overt congestive failure. Atherosclerosis in the transverse aorta. IMPRESSION: Cardiomegaly with mild pulmonary venous congestion. Aortic Atherosclerosis (ICD10-I70.0). Electronically Signed   By: Jeronimo Greaves M.D.   On: 11/09/2023 18:21     Assessment and Plan:   AF with RVR in setting of possible URI, primarily symptomatic with cough, URI symptoms, and chest wall tenderness.  Troponin negative.  CXR with mild pulmonary congestion and exam consistent with hypervolemia.  Initially hypotensive, but now improved.  While the rapid AF doesn't help in someone with diastolic dysfunction, I am not sure she will keep sinus rhythm very easily if we cardiovert her tonight because she appears physically stressed from the cough and the myalgias, and her prior DCCVs have frequently reverted back to AF after transient SR.  Her POs has been poor and she's a little volume up, so ok to use IV amiodarone tonight to see if that helps her rate slow down a little.  Perhaps a little bit of analgesia for her chest wall discomfort might alleviate the heart rate as well.  PE is on the differential given the pleuritic nature of her symptoms, but probability not high as she reports adherence to her DOAC.   Risk Assessment/Risk Scores:  New York Heart Association (NYHA) Functional Class NYHA Class II  CHA2DS2-VASc Score = 4  This indicates a  % annual risk of stroke. The patient's score is based upon:          For questions or updates, please contact [facility] Please consult [email or web address] for  contact info under    Signed, Eyvonne Left, MD  11/09/2023 10:55 PM

## 2023-11-09 NOTE — Telephone Encounter (Signed)
 Will forward to provider

## 2023-11-09 NOTE — ED Provider Notes (Addendum)
 [place] EMERGENCY DEPARTMENT AT [facility] Provider Note   CSN: [id number] Arrival date & time: 11/09/23  1627     History  Chief Complaint  Patient presents with   Chest Pain    [name] is a 68 y.o. female with history of A-fib on Eliquis, HFpEF, ESRD on HD (T, Thu, Sat) presents with 2 days of shortness of breath, chest pain, nonproductive cough abdominal pain and vomiting.  Pain is worse with lying down.  She has never had a MI before.  Has a history of cholecystectomy.  Last echo was in 2/25, last month with EF of 55 to 60%.  She does have a history of multiple cardioversions.  She reports compliance with her Eliquis, however notes that she did miss a dose within the past month.   Chest Pain  Past Medical History:  Diagnosis Date   Acute on chronic systolic CHF (congestive heart failure) (HCC) 12/21/2020   Allergic rhinitis    Anemia    Arthritis    Asthma    Chronic diastolic CHF (congestive heart failure) (HCC)    Chronic kidney disease    Dialysis Tu/TH/Sa   COPD (chronic obstructive pulmonary disease) (HCC)    COVID-19 03/28/2023   Depression    DM (diabetes mellitus) (HCC)    DVT (deep vein thrombosis) in pregnancy    Gastroenteritis 03/27/2023   GERD (gastroesophageal reflux disease)    HTN (hypertension)    Hyperlipidemia    Obesity    Pneumonia 04/2018   RIGHT LOBE   Sleep apnea    needs to be retested - to get new cpap       Home Medications Prior to Admission medications   Medication Sig Start Date End Date Taking? Authorizing Provider  acetaminophen (TYLENOL) 325 MG tablet Take 2 tablets (650 mg total) by mouth every 6 (six) hours as needed for mild pain or moderate pain. Patient taking differently: Take 650 mg by mouth as needed for mild pain (pain score 1-3) or moderate pain (pain score 4-6). 12/07/21   Arrien, York Ram, MD  albuterol (PROVENTIL HFA) 108 (90 Base) MCG/ACT inhaler Inhale 1-2 puffs into the lungs  every 6 (six) hours as needed for wheezing or shortness of breath. 10/13/22   Grayce Sessions, NP  amiodarone (PACERONE) 200 MG tablet TAKE 1 TABLET BY MOUTH EVERY DAY 05/21/23   Bensimhon, Bevelyn Buckles, MD  Blood Glucose Monitoring Suppl (ACCU-CHEK GUIDE) w/Device KIT Use to check blood sugar TID. 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 Dx: E11.69 Last reading: 105, 02-20-2023, Fasting. 06/28/22   Hoy Register, MD  busPIRone (BUSPAR) 5 MG tablet TAKE ONE TABLET BY MOUTH TWICE DAILY @ 9AM & 5PM 09/07/23   Grayce Sessions, NP  colchicine 0.6 MG tablet Take 1/2 (one-half) tablet by mouth once daily 11/23/22   Hoy Register, MD  diphenhydrAMINE (BENADRYL) 25 MG tablet Take 25 mg by mouth as needed. 03/31/22   [provider]  Doxercalciferol (HECTOROL IV) IV given on dialysis days 05/17/23 05/15/24  [provider]  DULoxetine (CYMBALTA) 60 MG capsule TAKE ONE CAPSULE (60MG  TOTAL) BY MOUTH DAILY AT 9AM 02/12/23   Grayce Sessions, NP  ELIQUIS 5 MG TABS tablet TAKE ONE TABLET (5MG  TOTAL) BY MOUTH TWICE DAILY @9AM -5PM 09/06/23   Bensimhon, Bevelyn Buckles, MD  EPINEPHrine 0.3 mg/0.3 mL IJ SOAJ injection Inject 0.3 mg into the muscle as needed for anaphylaxis. 06/25/20   Gwinda Passe  P, NP  fluticasone (FLONASE ALLERGY RELIEF) 50 MCG/ACT nasal spray Place 2 sprays into both nostrils as needed. 03/31/22   [provider]  glucose blood (ACCU-CHEK GUIDE) test strip USE TO CHECK BLOOD SUGAR 3 TIMES DAILY 04/03/22   Hoy Register, MD  insulin glargine (LANTUS SOLOSTAR) 100 UNIT/ML Solostar Pen Inject 10 Units into the skin daily. 09/27/23   Grayce Sessions, NP  insulin lispro (HUMALOG KWIKPEN) 100 UNIT/ML KwikPen For blood sugars 0-150 give 0 units of insulin, 151-200 give 2 units of insulin, 201-250 give 4 units, 251-300 give 6 units, 301-350 give 8 units, 351-400 give 10 units,> 400 give 12 units and call M.D. 09/27/23   Hoy Register, MD   lidocaine-prilocaine (EMLA) cream Apply 1 Application topically Every Tuesday,Thursday,and Saturday with dialysis. 06/01/22   [provider]  linaclotide (LINZESS) 72 MCG capsule Take 72 mcg by mouth as needed. 03/31/22   [provider]  loratadine (CLARITIN) 10 MG tablet TAKE ONE TABLET BY MOUTH DAILY AT 9AM 08/02/23   Grayce Sessions, NP  meclizine (ANTIVERT) 25 MG tablet Take 1 tablet (25 mg total) by mouth 3 (three) times daily as needed for dizziness. Patient taking differently: Take 25 mg by mouth as needed for dizziness. 03/18/23   Jonah Blue, MD  metoCLOPramide (REGLAN) 5 MG tablet Take 1 tablet (5 mg total) by mouth every 8 (eight) hours as needed for nausea. 07/10/23   Grayce Sessions, NP  metoprolol tartrate (LOPRESSOR) 25 MG tablet Take 0.5 tablets (12.5 mg total) by mouth 2 (two) times daily. 03/30/23 07/16/23  Noralee Stain, DO  midodrine (PROAMATINE) 10 MG tablet Take 1 tablet (10 mg total) by mouth See admin instructions. Take 10mg  (1 tablet) by mouth on Tuesday's, Thursday's, and Saturday's. 03/30/23   Noralee Stain, DO  Multiple Vitamins-Minerals (MULTIVITAMIN WOMEN PO) Take 1 tablet by mouth daily.    [provider]  nitroGLYCERIN (NITROSTAT) 0.4 MG SL tablet Place 1 tablet (0.4 mg total) under the tongue every 5 (five) minutes x 3 doses as needed for chest pain. 11/08/23   Jodelle Red, MD  ondansetron (ZOFRAN) 8 MG tablet Take 8 mg by mouth 2 (two) times daily as needed. 06/10/23   [provider]  pantoprazole (PROTONIX) 40 MG tablet Take 1 tablet (40 mg total) by mouth daily. 04/24/23   Grayce Sessions, NP  polyethylene glycol powder (MIRALAX) 17 GM/SCOOP powder Take by mouth as needed. 06/26/22   [provider]  rosuvastatin (CRESTOR) 10 MG tablet TAKE ONE TABLET BY MOUTH DAILY AT 5PM 04/13/23   Bensimhon, Bevelyn Buckles, MD      Allergies    Bee pollen, Bee venom, Sulfa antibiotics, Chlorhexidine, and Iodinated  contrast media    Review of Systems   Review of Systems  Cardiovascular:  Positive for chest pain.    Physical Exam Updated Vital Signs BP 126/87   Pulse (!) 145   Temp 99.4 F (37.4 C) (Oral)   Resp (!) 26   SpO2 97%  Physical Exam Vitals and nursing note reviewed.  Constitutional:      General: She is not in acute distress.    Appearance: She is well-developed.  HENT:     Head: Normocephalic and atraumatic.  Eyes:     Conjunctiva/sclera: Conjunctivae normal.  Cardiovascular:     Rate and Rhythm: Normal rate and regular rhythm.     Heart sounds: No murmur heard. Pulmonary:     Effort: Pulmonary effort is normal. No respiratory  distress.     Comments: Intermittent bibasilar rhonchi, no Rales Abdominal:     Palpations: Abdomen is soft.     Tenderness: There is abdominal tenderness.     Comments: Generalized abdominal tenderness, without rebound or guarding no peritoneal signs  Musculoskeletal:     Cervical back: Neck supple.     Comments: Bilateral lower extremity nonpitting edema  Skin:    General: Skin is warm and dry.     Capillary Refill: Capillary refill takes less than 2 seconds.  Neurological:     Mental Status: She is alert.  Psychiatric:        Mood and Affect: Mood normal.     ED Results / Procedures / Treatments   Labs (all labs ordered are listed, but only abnormal results are displayed) Labs Reviewed  BASIC METABOLIC PANEL - Abnormal; Notable for the following components:      Result Value   Chloride 93 (*)    Glucose, Bld 115 (*)    Creatinine, Ser 6.54 (*)    GFR, Estimated 6 (*)    Anion gap 18 (*)    All other components within normal limits  CBC - Abnormal; Notable for the following components:   RBC 2.94 (*)    Hemoglobin 9.4 (*)    HCT 29.7 (*)    MCV 101.0 (*)    All other components within normal limits  BRAIN NATRIURETIC PEPTIDE - Abnormal; Notable for the following components:   B Natriuretic Peptide 278.7 (*)    All other  components within normal limits  RESP PANEL BY RT-PCR (RSV, FLU A&B, COVID)  [id number]  TROPONIN I (HIGH SENSITIVITY)  TROPONIN I (HIGH SENSITIVITY)    EKG EKG Interpretation Date/Time:  Friday November 09 2023 16:42:23 EDT Ventricular Rate:  142 PR Interval:  81 QRS Duration:  90 QT Interval:  334 QTC Calculation: 514 R Axis:   160  Text Interpretation: Atrial fibrillation Sinus pause Probable right ventricular hypertrophy Inferior infarct, old Lateral leads are also involved Prolonged QT interval No significant change since last tracing Confirmed by Jacalyn Lefevre [id number] on 11/09/2023 4:49:23 PM  Radiology CT CHEST ABDOMEN PELVIS WO CONTRAST Result Date: 11/09/2023 CLINICAL DATA:  Chest and abdominal pain EXAM: CT CHEST, ABDOMEN AND PELVIS WITHOUT CONTRAST TECHNIQUE: Multidetector CT imaging of the chest, abdomen and pelvis was performed following the standard protocol without IV contrast. RADIATION DOSE REDUCTION: This exam was performed according to the departmental dose-optimization program which includes automated exposure control, adjustment of the mA and/or kV according to patient size and/or use of iterative reconstruction technique. COMPARISON:  06/18/2023 FINDINGS: CT CHEST FINDINGS Cardiovascular: Somewhat limited due to lack of IV contrast. Atherosclerotic calcifications of the aorta are noted. No aneurysmal dilatation is seen. Prior left atrial clipping is noted. Prior mitral and tricuspid valve surgery is noted. Mediastinum/Nodes: Thoracic inlet is within normal limits. No sizable hilar or mediastinal adenopathy is noted. The esophagus as visualized is within normal limits. Lungs/Pleura: Lungs are well aerated bilaterally. Areas of mosaic attenuation are noted consistent with air trapping. No focal infiltrate or sizable effusion is seen. No parenchymal nodule is noted. Musculoskeletal: Degenerative changes of the thoracic spine are noted. Chronic T11 compression deformity is noted. CT  ABDOMEN PELVIS FINDINGS Hepatobiliary: No focal liver abnormality is seen. Status post cholecystectomy. No biliary dilatation. Pancreas: Unremarkable. No pancreatic ductal dilatation or surrounding inflammatory changes. Spleen: Normal in size without focal abnormality. Adrenals/Urinary Tract: Adrenal glands are within normal limits. Kidneys are well visualize without  evidence of renal calculi. 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 Bladder is decompressed. Stomach/Bowel: Appendix is within normal limits. No obstructive or inflammatory changes of the colon are seen. Stomach and small bowel are unremarkable. Vascular/Lymphatic: Aortic atherosclerosis. No enlarged abdominal or pelvic lymph nodes. Reproductive: Status post hysterectomy. No adnexal masses. Other: No abdominal wall hernia or abnormality. No abdominopelvic ascites. Musculoskeletal: No acute or significant osseous findings. IMPRESSION: CT of the chest: Changes consistent with air trapping. No acute abnormality is noted. Chronic T2-11 compression fracture. CT of the abdomen and pelvis: No acute abnormality noted. 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 These of been stable over multiple previous exams. Electronically Signed   By: Alcide Clever M.D.   On: 11/09/2023 20:28   DG Chest 2 View Result Date: 11/09/2023 CLINICAL DATA:  Chest pain.  Cough since Wednesday. EXAM: CHEST - 2 VIEW COMPARISON:  07/03/2023 FINDINGS: Lateral view degraded by patient arm position. Lower thoracic mild compression deformity is of indeterminate acuity. Prior median sternotomy. Mitral valve repair. Left atrial appendage occlusion device. Moderate to marked cardiomegaly. No pneumothorax. No pleural fluid. Suspect mild pulmonary venous congestion without overt congestive failure. Atherosclerosis in the transverse aorta. IMPRESSION: Cardiomegaly with mild pulmonary venous congestion. Aortic  Atherosclerosis (ICD10-I70.0). Electronically Signed   By: Jeronimo Greaves M.D.   On: 11/09/2023 18:21    Procedures Procedures    Medications Ordered in ED Medications  amiodarone (NEXTERONE) 1.8 mg/mL load via infusion 150 mg (150 mg Intravenous Bolus from Bag 11/09/23 2138)    Followed by  amiodarone (NEXTERONE PREMIX) 360-4.14 MG/200ML-% (1.8 mg/mL) IV infusion (60 mg/hr Intravenous New Bag/Given 11/09/23 2138)    Followed by  amiodarone (NEXTERONE PREMIX) 360-4.14 MG/200ML-% (1.8 mg/mL) IV infusion (has no administration in time range)  diltiazem (CARDIZEM) injection 20 mg (10 mg Intravenous Given 11/09/23 1805)  sodium chloride 0.9 % bolus 500 mL (0 mLs Intravenous Stopped 11/09/23 2047)  sodium chloride 0.9 % bolus 500 mL (500 mLs Intravenous New Bag/Given 11/09/23 2139)  ipratropium-albuterol (DUONEB) 0.5-2.5 (3) MG/3ML nebulizer solution 3 mL (3 mLs Nebulization Given 11/09/23 2139)  acetaminophen (TYLENOL) tablet 1,000 mg (1,000 mg Oral Given 11/09/23 2137)    ED Course/ Medical Decision Making/ A&P                                 Medical Decision Making Amount and/or Complexity of Data Reviewed Labs: ordered. Radiology: ordered.  Risk OTC drugs. Prescription drug management. Decision regarding hospitalization.   This patient presents to the ED with chief complaint(s) of chest pain .  The complaint involves an extensive differential diagnosis and also carries with it a high risk of complications and morbidity.   pertinent past medical history as listed in HPI  The differential diagnosis includes  ACS, PE, aortic dissection, pneumonia, pneumothorax, myocarditis, pericarditis, musculoskeletal, GERD The initial plan is to  Will start with basic labs, EKG, chest x-ray Additional history obtained: Records reviewed Care Everywhere/External Records  Initial Assessment:   Hemodynamically stable, afebrile, nontoxic-appearing patient presenting with multiple complaints including  chest pain, cough, shortness of breath, abdominal pain and vomiting over the past few days.  On exam her lungs have bibasilar rhonchi without rales.  She has a persistent cough throughout exam.  Her abdomen is generally tender.  She has been generally compliant with her Eliquis but is missed a dose in  the past month.  Will hold off from cardioversion at this time particularly as she has predominantly been in A-fib recently.  Patient has been compliant with her dialysis.  Independent ECG interpretation:  Afrib with RVR  Independent labs interpretation:  The following labs were independently interpreted:  CBC with hemoglobin of 9.4, BMP with creatinine of 6.54, potassium within normal limits, respiratory panel negative, troponin without elevation, BMP 278  Independent visualization and interpretation of imaging: I independently visualized the following imaging with scope of interpretation limited to determining acute life threatening conditions related to emergency care:  CXR, which revealed cardiomegaly with mild pulmonary congestion We will obtain CT imaging, she does have contrast allergy CT chest consistent with air trapping CT abdomen pelvis without any acute abnormality  Treatment and Reassessment: Patient given 10 of diltiazem following first assessment  With pressures of 82/64 patient given 500 mL bolus  Started on amiodarone loading infusion, given another 500 mL bolus, DuoNeb and Tylenol 1000 mg  Upon reassessment patient's blood pressures were stable on the 116-120/80 with heart rates ranging from 108-117.  Still in A-fib  Consultations obtained:   Cardiology Dr. Regino Schultze, recommended amiodarone load and infusion, will consult Hospitalist Dr. Joneen Roach agreed for admission  Disposition:   Patient will be admitted for A-fib with RVR  Social Determinants of Health:   none  This note was dictated with voice recognition software.  Despite best efforts at proofreading, errors may  have occurred which can change the documentation meaning.          Final Clinical Impression(s) / ED Diagnoses Final diagnoses:  Atrial fibrillation with rapid ventricular response [facility])    Rx / DC Orders ED Discharge Orders     None         Halford Decamp, PA-C 11/09/23 2223    Halford Decamp, PA-C 11/09/23 2318    Jacalyn Lefevre, MD 11/09/23 [phone]

## 2023-11-09 NOTE — ED Notes (Signed)
 Patients dialysis access is in the left arm. Restricted extremity armband placed.

## 2023-11-09 NOTE — Telephone Encounter (Signed)
 Copied from CRM [id number]. Topic: Clinical - Red Word Triage >> Nov 09, 2023  9:13 AM Marland Kitchen D wrote: Patient wants to talk to a nurse she states she has a lot of health issues and she hasn't slept in 3 days  Pt hung up before being transferred over to nurse triage.    I called her back.   Reason for Disposition  [1] Insomnia persists > 1 week AND [2] no improvement after using Care Advice    Trouble sleeping for the last 3 nights.   Finally told me it was due to coughing.   She hung up on me while I was triaging her.  Said she would just go to the urgent care.    She does have an upcoming appt with Gwinda Passe, NP for 11/14/2023.  Answer Assessment - Initial Assessment Questions 1. DESCRIPTION: "Tell me about your sleeping problem."      I'm not sleeping well for 3 nights.   I feel terrible.   I'm coughing that started yesterday after dialysis.     I don't know if it's a cold or allergies.    It's cold in dialysis yesterday.   I started trembling when I got done with dialysis.    No fever.    I'm just coughing.  It's a dry cough.   Runny nose.   No sore throat.  2. ONSET: "How long have you been having trouble sleeping?" (e.g., days, weeks, months)     For the last 3 nights I have not slept well.   My mind is not racing.   I try to go to sleep but I can't because I'm coughing.    Then she said,   "Just never mind, I'll go to urgent care".    "I don't need to answer all these questions" and hung up. 3. RECURRENT: "Have you had sleeping problems before?"  If Yes, ask: "What happened that time?" "What helped your sleeping problem go away in the past?"      Unable to asked as she hung up while I was triaging her. 4. STRESS: "Is there anything in your life that is making you feel stressed or tense?"     Denies her mind racing.   Then mentioned having trouble sleeping because she is coughing a lot then she hung up. 5. PAIN: "Do you have any pain that is keeping you awake?" (e.g., back pain,  headache, abdomen pain)     Denies being in pain 6. CAFFEINE ABUSE: "Do you drink caffeinated beverages, and how much each day?" (e.g., coffee, tea, colas)     Not asked 7. ALCOHOL USE OR SUBSTANCE USE (DRUG USE): "Do you drink alcohol or use any illegal drugs?"     Not asked 8. OTHER SYMPTOMS: "Do you have any other symptoms?"  (e.g., difficulty breathing)     Insomnia, coughing, runny nose, and not feeling well.    Was cold in dialysis yesterday.   No fever yesterday at dialysis center.  Protocols used: Insomnia-A-AH  Chief Complaint: Insomnia for the last 3 nights.  She was vague in her answers during the triage process.   She finally said she was having trouble sleeping because she has this dry cough that is keeping her awake.  She was cold during dialysis yesterday and has a runny nose.   "I don't feel well".    "I just need her to call me in something for coughing".    Then she hung up on me.   "  Just never mind, I'll go to the urgent care".   "I don't need to answer all these questions" Symptoms: Insomnia from dry cough.  C/o runny nose and not feeling well.  Frequency: Since yesterday during dialysis.    It was cold in there.   Pertinent Negatives: Patient denies fever or sore throat. Disposition: [] ED /[] Urgent Care (no appt availability in office) / [] Appointment(In office/virtual)/ []  [place]/ [] Home Care/ [] Refused Recommended Disposition /[] [place]/ [x]  Follow-up with PCP Additional Notes: Pt hung up on me during the triage process so wasn't able to make an appt.   Noted she does have an appt with Gwinda Passe, NP for 11/14/2023.    I had to call her back because when she initially called in she hung up before being connected with nurse triage.   So I called her back.   She is wanting Marcelino Duster to call in something for the cough.   I let her know she would need to be seen and was getting more information from her when she hung up.    Message sent to  [facility].

## 2023-11-09 NOTE — ED Triage Notes (Signed)
 Pt BIB [facility] for left-sided CP that radiated down LUE. Dialysis pt, received full treatment yesterday with no complications. Reports onset of cough and congestion since Wednesday. Denies any known fevers.

## 2023-11-10 ENCOUNTER — Encounter (INDEPENDENT_AMBULATORY_CARE_PROVIDER_SITE_OTHER): Payer: Self-pay | Admitting: Primary Care

## 2023-11-10 DIAGNOSIS — I4891 Unspecified atrial fibrillation: Secondary | ICD-10-CM | POA: Diagnosis not present

## 2023-11-10 LAB — BASIC METABOLIC PANEL
Anion gap: 15 (ref 5–15)
BUN: 29 mg/dL — ABNORMAL HIGH (ref 8–23)
CO2: 27 mmol/L (ref 22–32)
Calcium: 9.8 mg/dL (ref 8.9–10.3)
Chloride: 94 mmol/L — ABNORMAL LOW (ref 98–111)
Creatinine, Ser: 7.68 mg/dL — ABNORMAL HIGH (ref 0.44–1.00)
GFR, Estimated: 5 mL/min — ABNORMAL LOW (ref >60)
Glucose, Bld: 179 mg/dL — ABNORMAL HIGH (ref 70–99)
Potassium: 4.2 mmol/L (ref 3.5–5.1)
Sodium: 136 mmol/L (ref 135–145)

## 2023-11-10 LAB — HEMOGLOBIN A1C
Hgb A1c MFr Bld: 5.7 % — ABNORMAL HIGH (ref 4.8–5.6)
Mean Plasma Glucose: 116.89 mg/dL

## 2023-11-10 LAB — CBC WITH DIFFERENTIAL/PLATELET
Abs Immature Granulocytes: 0.05 10*3/uL (ref 0.00–0.07)
Basophils Absolute: 0 10*3/uL (ref 0.0–0.1)
Basophils Relative: 1 %
Eosinophils Absolute: 0.3 10*3/uL (ref 0.0–0.5)
Eosinophils Relative: 3 %
HCT: 28.9 % — ABNORMAL LOW (ref 36.0–46.0)
Hemoglobin: 8.9 g/dL — ABNORMAL LOW (ref 12.0–15.0)
Immature Granulocytes: 1 %
Lymphocytes Relative: 15 %
Lymphs Abs: 1.3 10*3/uL (ref 0.7–4.0)
MCH: 31.8 pg (ref 26.0–34.0)
MCHC: 30.8 g/dL (ref 30.0–36.0)
MCV: 103.2 fL — ABNORMAL HIGH (ref 80.0–100.0)
Monocytes Absolute: 1.5 10*3/uL — ABNORMAL HIGH (ref 0.1–1.0)
Monocytes Relative: 17 %
Neutro Abs: 5.6 10*3/uL (ref 1.7–7.7)
Neutrophils Relative %: 63 %
Platelets: 150 10*3/uL (ref 150–400)
RBC: 2.8 MIL/uL — ABNORMAL LOW (ref 3.87–5.11)
RDW: 15.9 % — ABNORMAL HIGH (ref 11.5–15.5)
WBC: 8.8 10*3/uL (ref 4.0–10.5)
nRBC: 0 % (ref 0.0–0.2)

## 2023-11-10 LAB — GLUCOSE, CAPILLARY
Glucose-Capillary: 115 mg/dL — ABNORMAL HIGH (ref 70–99)
Glucose-Capillary: 122 mg/dL — ABNORMAL HIGH (ref 70–99)
Glucose-Capillary: 138 mg/dL — ABNORMAL HIGH (ref 70–99)
Glucose-Capillary: 132 mg/dL — ABNORMAL HIGH (ref 70–99)

## 2023-11-10 LAB — HEPATITIS B SURFACE ANTIGEN: Hepatitis B Surface Ag: NONREACTIVE

## 2023-11-10 LAB — CBG MONITORING, ED: Glucose-Capillary: 208 mg/dL — ABNORMAL HIGH (ref 70–99)

## 2023-11-10 LAB — MAGNESIUM: Magnesium: 2 mg/dL (ref 1.7–2.4)

## 2023-11-10 MED ORDER — FENTANYL CITRATE PF 50 MCG/ML IJ SOSY
25.0000 ug | PREFILLED_SYRINGE | INTRAMUSCULAR | Status: DC | PRN
  Administered 2023-11-10 – 2023-11-14 (×10): 25 ug via INTRAVENOUS
  Filled 2023-11-10 (×10): qty 1

## 2023-11-10 MED ORDER — MIDODRINE HCL 5 MG PO TABS
10.0000 mg | ORAL_TABLET | Freq: Once | ORAL | Status: AC
  Administered 2023-11-10: 10 mg via ORAL
  Filled 2023-11-10: qty 2

## 2023-11-10 MED ORDER — DIGOXIN 0.25 MG/ML IJ SOLN
0.2500 mg | Freq: Once | INTRAMUSCULAR | Status: AC
  Administered 2023-11-10: 0.25 mg via INTRAVENOUS
  Filled 2023-11-10: qty 2

## 2023-11-10 MED ORDER — NALOXONE HCL 0.4 MG/ML IJ SOLN: 0.4000 mg | INTRAMUSCULAR | Status: DC | PRN

## 2023-11-10 NOTE — Consult Note (Signed)
   Electrophysiology consultation   Patient ID: [name] MRN: [id number]; DOB: 1955/09/26  Admit date: 11/09/2023 Date of Consult: 11/10/2023  PCP:  Grayce Sessions, NP   History of Present Illness:   [name] is a 68 year old woman who I am seeing today for an evaluation of atrial fibrillation at the request of Dr. Regino Schultze.  This patient is known to me from the outpatient setting.  She has a history of ESRD on hemodialysis and hypertension and diabetes and persistent atrial fibrillation.  She also has a history of mitral and tricuspid valve surgeries.  I last saw the patient in clinic Jan 17, 2022.  At the last appointment she was on amiodarone for rhythm control and maintaining normal rhythm.  She was admitted yesterday with upper respiratory infection symptoms since Wednesday.  She has pleuritic chest discomfort, cough, wheezing.  No fevers.  In the emergency department she was in atrial fibrillation with rapidly conducted ventricular rates.   Past medical, surgical, social and family history reviewed.  ROS:  Please see the history of present illness.  All other ROS reviewed and negative.     Physical Exam/Data:   Vitals:   11/10/23 0430 11/10/23 0630 11/10/23 0740 11/10/23 0803  BP: 99/67 93/66  116/69  Pulse: (!) 129 (!) 125  91  Resp: (!) 27 20  19   Temp:   99 F (37.2 C) 98.1 F (36.7 C)  TempSrc:   Oral Oral  SpO2: 94% 98%  96%  Weight:    126.9 kg  Height:    5\' 7"  (1.702 m)    General: Obese, no distress lying at 30 degrees Cardiac: Irregular rhythm Lungs: Diffuse wheezing in bilateral lung fields Psych: Flat affect   EKG:  The EKG was personally reviewed and demonstrates: Atrial flutter with a ventricular rate of 142 bpm.  Atypical. Telemetry:  Telemetry was personally reviewed and demonstrates: Currently in sinus rhythm with frequent PACs  October 15, 2023 echo personally reviewed.  EF 55 to 60%.  RV mildly reduced.  Severely dilated  left and right atrium.  No significant mitral regurgitation.  Mitral ring and tricuspid ring.  Assessment and Plan:   #Atrial fibrillation, persistent The patient has an extensive arrhythmia history with persistent atrial fibrillation.  Her arrhythmias have been difficult to control.  She has limited options for rhythm control.  She is a poor candidate for an ablate/pace strategy.  For now, recommend continuing amiodarone drip for 48 hours.  After a total of 48 hours on the drip, recommend transitioning back to oral, 400 mg by mouth once daily.  In the outpatient setting this can be further down titrated.  Continue Eliquis for stroke prophylaxis  #Upper respiratory infection Management per IM.  I suspect this is the primary driver of her worsening rhythm control.       Sheria Lang T. Lalla Brothers, MD, [facility], [facility] Cardiac Electrophysiology

## 2023-11-10 NOTE — Hospital Course (Signed)
 68 year old female with past medical history of ESRD [T/Th/Sat], atrial fibrillation, HTN, HLD, T2DM, COPD, DVT, OSA, asthma, NICM, sp TVR.  She presents with complaint of trouble breathing, coughing, chest pains since Wednesday.  Her shortness of breath has been getting progressively worse as has her chest pain.  She endorses palpitations, seizures and compliance with her medication.  She denies lightheadedness or dizziness.  Per patient yesterday told her that shortness of breath, the coughing, the chest pains.  She came to the ER.   In the ER presenting vitals 121/100, 123.  Troponin 9=>10, EKG A-fib with RVR heart rate 130, QTc 531.  Respiratory panel negative.  Patient given IV Cardizem without improvement.  Amiodarone drip initiated.  Cardiologist Dr. Regino Schultze consulted.

## 2023-11-10 NOTE — ED Notes (Signed)
 This RN spoke to dialysis nurse and the HD floor will wait until afternoon session when the pt is hopefully more stable.

## 2023-11-10 NOTE — Progress Notes (Signed)
 [facility] night cross cover note:   I was notified by RN that the patient continues to experience similar chest discomfort, refractory to prn acetaminophen, and is supposed to be going to hemodialysis this evening in the setting of acute volume overload.  I subsequently added prn IV fentanyl to further address her discomfort.     Newton Pigg, DO Hospitalist

## 2023-11-10 NOTE — ED Notes (Signed)
 ED TO INPATIENT HANDOFF REPORT  ED Nurse Name and Phone #: Johnney Killian Name/Age/Gender [name] 68 y.o. female Room/Bed: 033C/033C  Code Status   Code Status: Full Code  Home/SNF/Other Home Patient oriented to: self, place, time, and situation Is this baseline? Yes   Triage Complete: Triage complete  Chief Complaint Atrial fibrillation with RVR (HCC) [I48.91]  Triage Note Pt BIB [facility] for left-sided CP that radiated down LUE. Dialysis pt, received full treatment yesterday with no complications. Reports onset of cough and congestion since Wednesday. Denies any known fevers.   Allergies Allergies  Allergen Reactions   Bee Pollen Anaphylaxis and Other (See Comments)    UNCONFIRMED, but IS allergic to bee VENOM   Bee Venom Anaphylaxis   Sulfa Antibiotics Itching   Chlorhexidine    Iodinated Contrast Media Nausea And Vomiting    Level of Care/Admitting Diagnosis ED Disposition     ED Disposition  Admit   Condition  --   Comment  Hospital Area: [facility] [[id number]]  Level of Care: Progressive [102]  Admit to Progressive based on following criteria: CARDIOVASCULAR & THORACIC of moderate stability with acute coronary syndrome symptoms/low risk myocardial infarction/hypertensive urgency/arrhythmias/heart failure potentially compromising stability and stable post cardiovascular intervention patients.  May admit patient to [facility] or [facility] if equivalent level of care is available:: Yes  Covid Evaluation: Asymptomatic - no recent exposure (last 10 days) testing not required  Diagnosis: Atrial fibrillation with RVR [facility]) [[id number]]  Admitting Physician: Gery Pray [[id number]]  Attending Physician: Gery Pray [[id number]]  Certification:: I certify this patient will need inpatient services for at least 2 midnights  Expected Medical Readiness: 11/11/2023          B Medical/Surgery History Past Medical History:  Diagnosis Date   Acute on  chronic systolic CHF (congestive heart failure) (HCC) 12/21/2020   Allergic rhinitis    Anemia    Arthritis    Asthma    Chronic diastolic CHF (congestive heart failure) (HCC)    Chronic kidney disease    Dialysis Tu/TH/Sa   COPD (chronic obstructive pulmonary disease) (HCC)    COVID-19 03/28/2023   Depression    DM (diabetes mellitus) (HCC)    DVT (deep vein thrombosis) in pregnancy    Gastroenteritis 03/27/2023   GERD (gastroesophageal reflux disease)    HTN (hypertension)    Hyperlipidemia    Obesity    Pneumonia 04/2018   RIGHT LOBE   Sleep apnea    needs to be retested - to get new cpap   Past Surgical History:  Procedure Laterality Date   ABDOMINAL HYSTERECTOMY     AV FISTULA PLACEMENT Left 03/17/2022   Procedure: LEFT ARM ARTERIOVENOUS (AV) FISTULA CREATION;  Surgeon: Chuck Hint, MD;  Location: [facility] OR;  Service: Vascular;  Laterality: Left;   BIOPSY  01/12/2023   Procedure: BIOPSY;  Surgeon: Jeani Hawking, MD;  Location: [facility] ENDOSCOPY;  Service: Gastroenterology;;   Thressa Sheller STUDY  11/26/2020   Procedure: BUBBLE STUDY;  Surgeon: Jodelle Red, MD;  Location: [facility] ENDOSCOPY;  Service: Cardiovascular;;   CARDIOVERSION N/A 05/06/2020   Procedure: CARDIOVERSION;  Surgeon: Jodelle Red, MD;  Location: [facility] ENDOSCOPY;  Service: Cardiovascular;  Laterality: N/A;   CARDIOVERSION N/A 11/04/2020   Procedure: CARDIOVERSION;  Surgeon: Lewayne Bunting, MD;  Location: [facility] ENDOSCOPY;  Service: Cardiovascular;  Laterality: N/A;   CARDIOVERSION N/A 02/01/2021   Procedure: CARDIOVERSION;  Surgeon: Dolores Patty, MD;  Location: [facility] ENDOSCOPY;  Service:  Cardiovascular;  Laterality: N/A;   CARDIOVERSION N/A 02/09/2021   Procedure: CARDIOVERSION;  Surgeon: Dolores Patty, MD;  Location: [facility] ENDOSCOPY;  Service: Cardiovascular;  Laterality: N/A;   CARDIOVERSION N/A 04/19/2021   Procedure: CARDIOVERSION;  Surgeon: Pricilla Riffle, MD;  Location: [facility] ENDOSCOPY;  Service:  Cardiovascular;  Laterality: N/A;   CARDIOVERSION N/A 11/25/2021   Procedure: CARDIOVERSION;  Surgeon: Dolores Patty, MD;  Location: [facility] ENDOSCOPY;  Service: Cardiovascular;  Laterality: N/A;   CARDIOVERSION N/A 01/31/2022   Procedure: CARDIOVERSION;  Surgeon: Dolores Patty, MD;  Location: [facility] ENDOSCOPY;  Service: Cardiovascular;  Laterality: N/A;   CARDIOVERSION N/A 07/23/2023   Procedure: CARDIOVERSION (CATH LAB);  Surgeon: Thurmon Fair, MD;  Location: [facility] INVASIVE CV LAB;  Service: Cardiovascular;  Laterality: N/A;   CATARACT EXTRACTION, BILATERAL     CHOLECYSTECTOMY     COLONOSCOPY WITH PROPOFOL N/A 03/05/2015   Procedure: COLONOSCOPY WITH PROPOFOL;  Surgeon: Jeani Hawking, MD;  Location: [facility] ENDOSCOPY;  Service: Endoscopy;  Laterality: N/A;   DIAGNOSTIC LAPAROSCOPY     ESOPHAGOGASTRODUODENOSCOPY (EGD) WITH PROPOFOL N/A 01/12/2023   Procedure: ESOPHAGOGASTRODUODENOSCOPY (EGD) WITH PROPOFOL;  Surgeon: Jeani Hawking, MD;  Location: [facility] ENDOSCOPY;  Service: Gastroenterology;  Laterality: N/A;   FISTULA SUPERFICIALIZATION Left 03/17/2022   Procedure: FISTULA SUPERFICIALIZATION -LEFT;  Surgeon: Chuck Hint, MD;  Location: [facility] OR;  Service: Vascular;  Laterality: Left;   ingrown hallux Left    IR FLUORO GUIDE CV LINE RIGHT  03/14/2022   IR US GUIDE VASC ACCESS RIGHT  03/14/2022   KNEE SURGERY     LEFT HEART CATH AND CORONARY ANGIOGRAPHY N/A 12/31/2017   Procedure: LEFT HEART CATH AND CORONARY ANGIOGRAPHY;  Surgeon: Rinaldo Cloud, MD;  Location: [facility] INVASIVE CV LAB;  Service: Cardiovascular;  Laterality: N/A;   MAZE N/A 12/29/2020   Procedure: MAZE;  Surgeon: Loreli Slot, MD;  Location: [facility] OR;  Service: Open Heart Surgery;  Laterality: N/A;   MITRAL VALVE REPAIR N/A 12/29/2020   Procedure: MITRAL VALVE REPAIR USING CARBOMEDICS ANNULOFLEX RING SIZE 30;  Surgeon: Loreli Slot, MD;  Location: [facility] OR;  Service: Open Heart Surgery;  Laterality: N/A;   REVISON OF  ARTERIOVENOUS FISTULA Left 01/05/2023   Procedure: REVISION OF LEFT ARTERIOVENOUS FISTULA WITH INTERPOSITION PTFE GRAFT;  Surgeon: Chuck Hint, MD;  Location: [facility] OR;  Service: Vascular;  Laterality: Left;  ELIQUIS AND HOLD 48 X HOURS   RIGHT HEART CATH N/A 12/22/2020   Procedure: RIGHT HEART CATH;  Surgeon: Laurey Morale, MD;  Location: [facility] INVASIVE CV LAB;  Service: Cardiovascular;  Laterality: N/A;   RIGHT HEART CATH N/A 01/31/2021   Procedure: RIGHT HEART CATH;  Surgeon: Dolores Patty, MD;  [facility] INVASIVE CV LAB;  Service: Cardiovascular;  Laterality: N/A;   RIGHT HEART CATH N/A 07/29/2021   Procedure: RIGHT HEART CATH;  Surgeon: Dolores Patty, MD;  [facility] INVASIVE CV LAB;  Service: Cardiovascular;  Laterality: N/A;   RIGHT HEART CATH N/A 11/24/2021   Procedure: RIGHT HEART CATH;  Surgeon: Dolores Patty, MD;  [facility]: [facility] INVASIVE CV LAB;  Service: Cardiovascular;  Laterality: N/A;   RIGHT HEART CATH N/A 02/14/2022   Procedure: RIGHT HEART CATH;  Surgeon: Dolores Patty, MD;  [facility] INVASIVE CV LAB;  Service: Cardiovascular;  Laterality: N/A;   RIGHT HEART CATH N/A 02/22/2022   Procedure: RIGHT HEART CATH;  Surgeon: Laurey Morale, MD;  Location: [facility] INVASIVE CV LAB;  Service: Cardiovascular;  Laterality: N/A;  TEE WITHOUT CARDIOVERSION N/A 11/26/2020   Procedure: TRANSESOPHAGEAL ECHOCARDIOGRAM (TEE);  Surgeon: Jodelle Red, MD;  Location: [facility] ENDOSCOPY;  Service: Cardiovascular;  Laterality: N/A;   TEE WITHOUT CARDIOVERSION N/A 12/29/2020   Procedure: TRANSESOPHAGEAL ECHOCARDIOGRAM (TEE);  Surgeon: Loreli Slot, MD;  Location: [facility] OR;  Service: Open Heart Surgery;  Laterality: N/A;   TEE WITHOUT CARDIOVERSION N/A 01/20/2022   Procedure: TRANSESOPHAGEAL ECHOCARDIOGRAM (TEE);  Surgeon: Dolores Patty, MD;  Location: [facility] ENDOSCOPY;  Service: Cardiovascular;  Laterality: N/A;   TRICUSPID VALVE REPLACEMENT N/A 12/29/2020   Procedure:  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 RING SIZE 34;  Surgeon: Loreli Slot, MD;  Location: [facility] OR;  Service: Open Heart Surgery;  Laterality: N/A;     A IV Location/Drains/Wounds Patient Lines/Drains/Airways Status     Active Line/Drains/Airways     Name Placement date Placement time Site Days   Peripheral IV 11/09/23 20 G Anterior;Proximal;Right Forearm 11/09/23  1640  Forearm  1   Fistula / Graft Left Upper arm --  --  Upper arm  --            Intake/Output Last 24 hours No intake or output data in the 24 hours ending 11/10/23 0700  Labs/Imaging Results for orders placed or performed during the hospital encounter of 11/09/23 (from the past 48 hours)  Basic metabolic panel     Status: Abnormal   Collection Time: 11/09/23  4:34 PM  Result Value Ref Range   Sodium 139 135 - 145 mmol/L   Potassium 4.2 3.5 - 5.1 mmol/L   Chloride 93 (L) 98 - 111 mmol/L   CO2 28 22 - 32 mmol/L   Glucose, Bld 115 (H) 70 - 99 mg/dL    Comment: Glucose reference range applies only to samples taken after fasting for at least 8 hours.   BUN 22 8 - 23 mg/dL   Creatinine, Ser 8.65 (H) 0.44 - 1.00 mg/dL   Calcium 78.4 8.9 - 69.6 mg/dL   GFR, Estimated 6 (L) >60 mL/min    Comment: (NOTE) Calculated using the CKD-EPI Creatinine Equation (2021)    Anion gap 18 (H) 5 - 15    Comment: Performed at [facility] Lab, [address], [place], [place] [address]  CBC     Status: Abnormal   Collection Time: 11/09/23  4:34 PM  Result Value Ref Range   WBC 8.6 4.0 - 10.5 K/uL   RBC 2.94 (L) 3.87 - 5.11 MIL/uL   Hemoglobin 9.4 (L) 12.0 - 15.0 g/dL   HCT 41.3 (L) 24.4 - 01.0 %   MCV 101.0 (H) 80.0 - 100.0 fL   MCH 32.0 26.0 - 34.0 pg   MCHC 31.6 30.0 - 36.0 g/dL   RDW 27.2 53.6 - 64.4 %   Platelets 164 150 - 400 K/uL   nRBC 0.0 0.0 - 0.2 %    Comment: Performed at [facility] Lab, [address] [place], [place] [address]  Troponin I (High Sensitivity)     Status: None    Collection Time: 11/09/23  4:34 PM  Result Value Ref Range   Troponin I (High Sensitivity) 9 <18 ng/L    Comment: (NOTE) Elevated high sensitivity troponin I (hsTnI) values and significant  changes across serial measurements may suggest ACS but many other  chronic and acute conditions are known to elevate hsTnI results.  Refer to the "Links" section for chest pain algorithms and additional  guidance. Performed at [facility],  [address], [place], [place] [address]   Resp panel by RT-PCR (RSV, Flu A&B, Covid) Anterior Nasal Swab     Status: None   Collection Time: 11/09/23  4:34 PM   Specimen: Anterior Nasal Swab  Result Value Ref Range   SARS Coronavirus 2 by RT PCR NEGATIVE NEGATIVE   Influenza A by PCR NEGATIVE NEGATIVE   Influenza B by PCR NEGATIVE NEGATIVE    Comment: (NOTE) The Xpert Xpress SARS-CoV-2/FLU/RSV plus assay is intended as an aid in the diagnosis of influenza from Nasopharyngeal swab specimens and should not be used as a sole basis for treatment. Nasal washings and aspirates are unacceptable for Xpert Xpress SARS-CoV-2/FLU/RSV testing.  Fact Sheet for Patients: BloggerCourse.com  Fact Sheet for Healthcare Providers: SeriousBroker.it  This test is not yet approved or cleared by the [place] FDA and has been authorized for detection and/or diagnosis of SARS-CoV-2 by FDA under an Emergency Use Authorization (EUA). This EUA will remain in effect (meaning this test can be used) for the duration of the COVID-19 declaration under Section 564(b)(1) of the Act, 21 U.S.C. section 360bbb-3(b)(1), unless the authorization is terminated or revoked.     Resp Syncytial Virus by PCR NEGATIVE NEGATIVE    Comment: (NOTE) Fact Sheet for Patients: BloggerCourse.com  Fact Sheet for Healthcare Providers: SeriousBroker.it  This test is not yet approved or  cleared by the [place] FDA and has been authorized for detection and/or diagnosis of SARS-CoV-2 by FDA under an Emergency Use Authorization (EUA). This EUA will remain in effect (meaning this test can be used) for the duration of the COVID-19 declaration under Section 564(b)(1) of the Act, 21 U.S.C. section 360bbb-3(b)(1), unless the authorization is terminated or revoked.  Performed at [facility], [address], [place], [place] [address]   Brain natriuretic peptide     Status: Abnormal   Collection Time: 11/09/23  4:34 PM  Result Value Ref Range   B Natriuretic Peptide 278.7 (H) 0.0 - 100.0 pg/mL    Comment: Performed at [facility], [address] [place], [place] [address]  Hemoglobin A1c     Status: Abnormal   Collection Time: 11/09/23  4:51 PM  Result Value Ref Range   Hgb A1c MFr Bld 5.7 (H) 4.8 - 5.6 %    Comment: (NOTE) Pre diabetes:          5.7%-6.4%  Diabetes:              >6.4%  Glycemic control for   <7.0% adults with diabetes    Mean Plasma Glucose 116.89 mg/dL    Comment: Performed at [facility], [address], [place], [place] [address]  Troponin I (High Sensitivity)     Status: None   Collection Time: 11/09/23  6:20 PM  Result Value Ref Range   Troponin I (High Sensitivity) 10 <18 ng/L    Comment: (NOTE) Elevated high sensitivity troponin I (hsTnI) values and significant  changes across serial measurements may suggest ACS but many other  chronic and acute conditions are known to elevate hsTnI results.  Refer to the "Links" section for chest pain algorithms and additional  guidance. Performed at [facility], [address], [place], [place] [address]   Magnesium     Status: None   Collection Time: 11/09/23  6:20 PM  Result Value Ref Range   Magnesium 2.0 1.7 - 2.4 mg/dL    Comment: Performed at [facility],  [address], [place], [place] [address]  CBG monitoring, ED     Status: Abnormal   Collection  Time: 11/09/23 11:02 PM  Result Value Ref Range   Glucose-Capillary 126 (H) 70 - 99 mg/dL    Comment: Glucose reference range applies only to samples taken after fasting for at least 8 hours.  CBG monitoring, ED     Status: Abnormal   Collection Time: 11/10/23  2:07 AM  Result Value Ref Range   Glucose-Capillary 208 (H) 70 - 99 mg/dL    Comment: Glucose reference range applies only to samples taken after fasting for at least 8 hours.  CBC with Differential/Platelet     Status: Abnormal   Collection Time: 11/10/23  6:20 AM  Result Value Ref Range   WBC 8.8 4.0 - 10.5 K/uL   RBC 2.80 (L) 3.87 - 5.11 MIL/uL   Hemoglobin 8.9 (L) 12.0 - 15.0 g/dL   HCT 84.6 (L) 96.2 - 95.2 %   MCV 103.2 (H) 80.0 - 100.0 fL   MCH 31.8 26.0 - 34.0 pg   MCHC 30.8 30.0 - 36.0 g/dL   RDW 84.1 (H) 32.4 - 40.1 %   Platelets 150 150 - 400 K/uL   nRBC 0.0 0.0 - 0.2 %   Neutrophils Relative % 63 %   Neutro Abs 5.6 1.7 - 7.7 K/uL   Lymphocytes Relative 15 %   Lymphs Abs 1.3 0.7 - 4.0 K/uL   Monocytes Relative 17 %   Monocytes Absolute 1.5 (H) 0.1 - 1.0 K/uL   Eosinophils Relative 3 %   Eosinophils Absolute 0.3 0.0 - 0.5 K/uL   Basophils Relative 1 %   Basophils Absolute 0.0 0.0 - 0.1 K/uL   Immature Granulocytes 1 %   Abs Immature Granulocytes 0.05 0.00 - 0.07 K/uL    Comment: Performed at [facility], [address] [place], [place] [address]   *Note: Due to a large number of results and/or encounters for the requested time period, some results have not been displayed. A complete set of results can be found in Results Review.   CT CHEST ABDOMEN PELVIS WO CONTRAST Result Date: 11/09/2023 CLINICAL DATA:  Chest and abdominal pain EXAM: CT CHEST, ABDOMEN AND PELVIS WITHOUT CONTRAST TECHNIQUE: Multidetector CT imaging of the chest, abdomen and pelvis was performed following the standard protocol without IV contrast. RADIATION DOSE REDUCTION: This exam was performed according to the departmental  dose-optimization program which includes automated exposure control, adjustment of the mA and/or kV according to patient size and/or use of iterative reconstruction technique. COMPARISON:  06/18/2023 FINDINGS: CT CHEST FINDINGS Cardiovascular: Somewhat limited due to lack of IV contrast. Atherosclerotic calcifications of the aorta are noted. No aneurysmal dilatation is seen. Prior left atrial clipping is noted. Prior mitral and tricuspid valve surgery is noted. Mediastinum/Nodes: Thoracic inlet is within normal limits. No sizable hilar or mediastinal adenopathy is noted. The esophagus as visualized is within normal limits. Lungs/Pleura: Lungs are well aerated bilaterally. Areas of mosaic attenuation are noted consistent with air trapping. No focal infiltrate or sizable effusion is seen. No parenchymal nodule is noted. Musculoskeletal: Degenerative changes of the thoracic spine are noted. Chronic T11 compression deformity is noted. CT ABDOMEN PELVIS FINDINGS Hepatobiliary: No focal liver abnormality is seen. Status post cholecystectomy. No biliary dilatation. Pancreas: Unremarkable. No pancreatic ductal dilatation or surrounding inflammatory changes. Spleen: Normal in size without focal abnormality. Adrenals/Urinary Tract: Adrenal glands are within normal limits. Kidneys are well visualize without evidence of renal  calculi. 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 Bladder is decompressed. Stomach/Bowel: Appendix is within normal limits. No obstructive or inflammatory changes of the colon are seen. Stomach and small bowel are unremarkable. Vascular/Lymphatic: Aortic atherosclerosis. No enlarged abdominal or pelvic lymph nodes. Reproductive: Status post hysterectomy. No adnexal masses. Other: No abdominal wall hernia or abnormality. No abdominopelvic ascites. Musculoskeletal: No acute or significant osseous findings. IMPRESSION: CT of the chest: Changes  consistent with air trapping. No acute abnormality is noted. Chronic T2-11 compression fracture. CT of the abdomen and pelvis: No acute abnormality noted. 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 These of been stable over multiple previous exams. Electronically Signed   By: Alcide Clever M.D.   On: 11/09/2023 20:28   DG Chest 2 View Result Date: 11/09/2023 CLINICAL DATA:  Chest pain.  Cough since Wednesday. EXAM: CHEST - 2 VIEW COMPARISON:  07/03/2023 FINDINGS: Lateral view degraded by patient arm position. Lower thoracic mild compression deformity is of indeterminate acuity. Prior median sternotomy. Mitral valve repair. Left atrial appendage occlusion device. Moderate to marked cardiomegaly. No pneumothorax. No pleural fluid. Suspect mild pulmonary venous congestion without overt congestive failure. Atherosclerosis in the transverse aorta. IMPRESSION: Cardiomegaly with mild pulmonary venous congestion. Aortic Atherosclerosis (ICD10-I70.0). Electronically Signed   By: Jeronimo Greaves M.D.   On: 11/09/2023 18:21    Pending Labs Unresulted Labs (From admission, onward)     Start     Ordered   11/10/23 0531  Hepatitis B surface antigen  (New Admission Hemo Labs (Hepatitis B))  Once,   R        11/10/23 0531   11/10/23 0531  Hepatitis B surface antibody,quantitative  (New Admission Hemo Labs (Hepatitis B))  Once,   R        11/10/23 0531   11/10/23 0500  Basic metabolic panel  Tomorrow morning,   R        11/09/23 2224            Vitals/Pain Today's Vitals   11/10/23 0330 11/10/23 0400 11/10/23 0430 11/10/23 0630  BP: 103/71 101/70 99/67 93/66   Pulse: (!) 106 (!) 103 (!) 129 (!) 125  Resp: (!) 29 (!) 26 (!) 27 20  Temp:      TempSrc:      SpO2: 100% 98% 94% 98%  PainSc:        Isolation Precautions No active isolations  Medications Medications  amiodarone (NEXTERONE) 1.8 mg/mL load via infusion 150 mg (150 mg Intravenous Bolus from Bag 11/09/23 2138)     Followed by  amiodarone (NEXTERONE PREMIX) 360-4.14 MG/200ML-% (1.8 mg/mL) IV infusion (0 mg/hr Intravenous Stopped 11/10/23 0310)    Followed by  amiodarone (NEXTERONE PREMIX) 360-4.14 MG/200ML-% (1.8 mg/mL) IV infusion (30 mg/hr Intravenous New Bag/Given 11/10/23 0311)  insulin aspart (novoLOG) injection 0-15 Units (has no administration in time range)  insulin aspart (novoLOG) injection 0-5 Units ( Subcutaneous Not Given 11/09/23 2303)  acetaminophen (TYLENOL) tablet 650 mg (has no administration in time range)    Or  acetaminophen (TYLENOL) suppository 650 mg (has no administration in time range)  senna-docusate (Senokot-S) tablet 1 tablet (has no administration in time range)  prochlorperazine (COMPAZINE) injection 5 mg (has no administration in time range)  apixaban (ELIQUIS) tablet 5 mg (5 mg Oral Given 11/09/23 2306)  busPIRone (BUSPAR) tablet 5 mg (5 mg Oral Given 11/10/23 0204)  DULoxetine (CYMBALTA) DR capsule 60 mg (has no administration in time  range)  insulin glargine (LANTUS) injection 10 Units (10 Units Subcutaneous Given 11/10/23 0206)  metoCLOPramide (REGLAN) tablet 5 mg (has no administration in time range)  metoprolol tartrate (LOPRESSOR) tablet 12.5 mg (12.5 mg Oral Not Given 11/10/23 0059)  pantoprazole (PROTONIX) EC tablet 40 mg (has no administration in time range)  rosuvastatin (CRESTOR) tablet 10 mg (has no administration in time range)  fluticasone (FLONASE) 50 MCG/ACT nasal spray 2 spray (has no administration in time range)  guaiFENesin (ROBITUSSIN) 100 MG/5ML liquid 5 mL (5 mLs Oral Given 11/10/23 0315)  oxyCODONE (Oxy IR/ROXICODONE) immediate release tablet 5 mg (0 mg Oral Hold 11/10/23 0100)  diltiazem (CARDIZEM) injection 20 mg (10 mg Intravenous Given 11/09/23 1805)  sodium chloride 0.9 % bolus 500 mL (0 mLs Intravenous Stopped 11/09/23 2047)  sodium chloride 0.9 % bolus 500 mL (0 mLs Intravenous Stopped 11/09/23 2306)  ipratropium-albuterol (DUONEB) 0.5-2.5 (3) MG/3ML  nebulizer solution 3 mL (3 mLs Nebulization Given 11/09/23 2139)  acetaminophen (TYLENOL) tablet 1,000 mg (1,000 mg Oral Given 11/09/23 2137)  digoxin (LANOXIN) 0.25 MG/ML injection 0.25 mg (0.25 mg Intravenous Given 11/10/23 0207)  midodrine (PROAMATINE) tablet 10 mg (10 mg Oral Given 11/10/23 0204)    Mobility walks     Focused Assessments Renal Assessment Handoff:  Hemodialysis Schedule: Hemodialysis Schedule: Tuesday/Thursday/Saturday Last Hemodialysis date and time: Thursday   Restricted appendage: right arm   R Recommendations: See Admitting Provider Note  Report given to:   Additional Notes:

## 2023-11-10 NOTE — ED Notes (Signed)
 Consent is signed electronically

## 2023-11-10 NOTE — Consult Note (Signed)
 Reason for Consult:ESRD Referring Physician:  Dr. [name]  Chief Complaint: Chest pain  Dialysis Orders: SW TTS  4h  400/600   124.4 kg  3K/2.5Ca bath AVF   Heparin 2500 - last OP HD 3/20, usually getting to or close to dry wt - hectorol 3 mcg  - venofer 50 mg qweekly - mircera 150 mcg IV q 2, last on 3/19   Assessment/Plan: ESRD - HD  on admit overnight, essentially still on TTS schedule last treatment on Thursday.  Will plan on dialyzing Saturday on schedule   URI per primary. Chronic combined systolic and diastolic CHF  with peripheral patient seen by cardiology.  Hypertension/volume  -  BP controlled, denies SOB.  A Fib at home on eliquis seen by cardiology. Patient has been cardioverted in the past last time July 23, 2023, on DOAC, prior MAZE. Anemia  - Hgb 9.4 (not due for ESA), dose on 3/19 Metabolic bone disease -   check a phos for binder management.  In past Vel phoro binder but gi distress , not on binder for now will fu  phos trend as op.  8. Nutrition - renal diet, carb modified.  9  DM type 2 - stable.  Plan per primary   HPI: [name] is an 68 y.o. female with a history of atrial fibrillation, hypertension, hyperlipidemia, diabetes, COPD, DVT, OSA, asthma, NICM, status post TAVR, ESRD dialyzing Tuesday Thursdays and Saturdays at [facility] with Dr. Thedore Mins.  His last dialysis was on Thursday for a full treatment leaving above her dry weight, likely this reported.  Patient left above her dry weight but I question whether this was recorded correctly as the prior to treatment she was leaving at or just above her dry weight.  Patient said they were able to remove fluid from her which is consistent with stated in documentation.  Patient does state after dialysis on Thursday she developed a cough, not feeling well, but denies any fever, chills, diaphoresis, myalgias; also had cramping on dialysis Thursday.  Patient had chest pain prior to dialysis on Thursday.  She is  now presenting with shortness of breath, chest pain, nonproductive cough; states that the chest pain was worse with deep inspiration as well as with coughing.  Also had nausea, vomiting and very poor appetite.  She denies any dizziness, lightheadedness, loss of consciousness.  Emergency department diastolic pressure was 100, noted to be in atrial fibrillation with RVR heart rate 150 initially but improved to the low 100s with hydration.  Amiodarone drip was initiated after failing rate control with IV Cardizem.   ROS Pertinent items are noted in HPI.  Chemistry and CBC: Creatinine, Ser  Date/Time Value Ref Range Status  11/09/2023 04:34 PM 6.54 (H) 0.44 - 1.00 mg/dL Final  47/82/9562 13:08 AM 10.82 (H) 0.44 - 1.00 mg/dL Final  65/78/4696 29:52 AM 14.65 (H) 0.44 - 1.00 mg/dL Final  84/13/2440 10:27 AM 13.25 (H) 0.44 - 1.00 mg/dL Final  25/36/6440 34:74 PM 10.60 (H) 0.44 - 1.00 mg/dL Final  25/95/6387 56:43 PM 9.32 (H) 0.44 - 1.00 mg/dL Final  32/95/1884 16:60 AM 7.03 (H) 0.44 - 1.00 mg/dL Final  63/08/6008 93:23 PM 10.54 (H) 0.44 - 1.00 mg/dL Final    Comment:    DIALYSIS DELTA CHECK NOTED   03/27/2023 09:46 AM 6.39 (H) 0.44 - 1.00 mg/dL Final  55/73/2202 54:27 AM 6.69 (H) 0.44 - 1.00 mg/dL Final  02/11/7627 31:51 AM 8.60 (H) 0.44 - 1.00 mg/dL Final  03/16/2023 03:04 AM 6.49 (H) 0.44 - 1.00 mg/dL Final  16/05/9603 54:09 AM 8.64 (H) 0.44 - 1.00 mg/dL Final  81/19/1478 29:56 PM 7.48 (H) 0.44 - 1.00 mg/dL Final  21/30/8657 84:69 AM 5.44 (H) 0.44 - 1.00 mg/dL Final  62/95/2841 32:44 PM 10.35 (H) 0.57 - 1.00 mg/dL Final    Comment:    **Verified by repeat analysis**  01/12/2023 08:29 AM 7.50 (H) 0.44 - 1.00 mg/dL Final  08/23/7251 66:44 PM 4.49 (H) 0.44 - 1.00 mg/dL Final  03/47/4259 56:38 AM 6.70 (H) 0.44 - 1.00 mg/dL Final  75/64/3329 51:88 AM 5.25 (H) 0.44 - 1.00 mg/dL Final  41/66/0630 16:01 PM 3.92 (H) 0.44 - 1.00 mg/dL Final  09/32/3557 32:20 PM 7.30 (H) 0.44 - 1.00 mg/dL Final   25/42/7062 37:62 AM 8.20 (H) 0.44 - 1.00 mg/dL Final  83/15/1761 60:73 PM 6.65 (H) 0.44 - 1.00 mg/dL Final  71/01/2693 85:46 PM 8.80 (H) 0.44 - 1.00 mg/dL Final  27/10/5007 38:18 PM 8.08 (H) 0.44 - 1.00 mg/dL Final  29/93/7169 67:89 AM 7.26 (H) 0.44 - 1.00 mg/dL Final  38/05/1750 02:58 PM 8.99 (H) 0.44 - 1.00 mg/dL Final  52/77/8242 35:36 AM 7.19 (H) 0.44 - 1.00 mg/dL Final  14/43/1540 08:67 PM 5.38 (H) 0.44 - 1.00 mg/dL Final  61/95/0932 67:12 AM 7.75 (H) 0.44 - 1.00 mg/dL Final  45/80/9983 38:25 PM 2.16 (H) 0.44 - 1.00 mg/dL Final  05/39/7673 41:93 AM 4.43 (H) 0.44 - 1.00 mg/dL Final  79/09/4095 35:32 AM 4.62 (H) 0.44 - 1.00 mg/dL Final  99/24/2683 41:96 AM 4.62 (H) 0.44 - 1.00 mg/dL Final  22/29/7989 21:19 AM 3.90 (H) 0.44 - 1.00 mg/dL Final  41/74/0814 48:18 AM 5.47 (H) 0.44 - 1.00 mg/dL Final  56/31/4970 26:37 AM 6.70 (H) 0.44 - 1.00 mg/dL Final  85/88/5027 74:12 AM 6.78 (H) 0.44 - 1.00 mg/dL Final  87/86/7672 09:47 AM 6.41 (H) 0.44 - 1.00 mg/dL Final  09/62/8366 29:47 AM 4.77 (H) 0.44 - 1.00 mg/dL Final    Comment:    DELTA CHECK NOTED  03/10/2022 02:33 AM 3.12 (H) 0.44 - 1.00 mg/dL Final  65/46/5035 46:56 PM 2.83 (H) 0.44 - 1.00 mg/dL Final  81/27/5170 01:74 AM 2.46 (H) 0.44 - 1.00 mg/dL Final  94/49/6759 16:38 PM 2.46 (H) 0.44 - 1.00 mg/dL Final  46/65/9935 70:17 AM 3.33 (H) 0.44 - 1.00 mg/dL Final  79/39/0300 92:33 AM 3.57 (H) 0.44 - 1.00 mg/dL Final  00/76/2263 33:54 AM 3.19 (H) 0.44 - 1.00 mg/dL Final  56/25/6389 37:34 AM 3.22 (H) 0.44 - 1.00 mg/dL Final  28/76/8115 72:62 AM 3.29 (H) 0.44 - 1.00 mg/dL Final  03/55/9741 63:84 AM 3.10 (H) 0.44 - 1.00 mg/dL Final  53/64/6803 21:22 AM 3.33 (H) 0.44 - 1.00 mg/dL Final   Recent Labs  Lab 11/09/23 1634  NA 139  K 4.2  CL 93*  CO2 28  GLUCOSE 115*  BUN 22  CREATININE 6.54*  CALCIUM 10.1   Recent Labs  Lab 11/09/23 1634  WBC 8.6  HGB 9.4*  HCT 29.7*  MCV 101.0*  PLT 164   Liver Function Tests: No results for  input(s): "AST", "ALT", "ALKPHOS", "BILITOT", "PROT", "ALBUMIN" in the last 168 hours. No results for input(s): "LIPASE", "AMYLASE" in the last 168 hours. No results for input(s): "AMMONIA" in the last 168 hours. Cardiac Enzymes: No results for input(s): "CKTOTAL", "CKMB", "CKMBINDEX", "TROPONINI" in the last 168 hours. Iron Studies: No results for input(s): "IRON", "TIBC", "TRANSFERRIN", "FERRITIN" in the last 72 hours. PT/INR: @  LABRCNTIP(inr:5)  Xrays/Other Studies: ) Results for orders placed or performed during the hospital encounter of 11/09/23 (from the past 48 hours)  Basic metabolic panel     Status: Abnormal   Collection Time: 11/09/23  4:34 PM  Result Value Ref Range   Sodium 139 135 - 145 mmol/L   Potassium 4.2 3.5 - 5.1 mmol/L   Chloride 93 (L) 98 - 111 mmol/L   CO2 28 22 - 32 mmol/L   Glucose, Bld 115 (H) 70 - 99 mg/dL    Comment: Glucose reference range applies only to samples taken after fasting for at least 8 hours.   BUN 22 8 - 23 mg/dL   Creatinine, Ser 1.61 (H) 0.44 - 1.00 mg/dL   Calcium 09.6 8.9 - 04.5 mg/dL   GFR, Estimated 6 (L) >60 mL/min    Comment: (NOTE) Calculated using the CKD-EPI Creatinine Equation (2021)    Anion gap 18 (H) 5 - 15    Comment: Performed at [facility], [address] [place], [place] [address]  CBC     Status: Abnormal   Collection Time: 11/09/23  4:34 PM  Result Value Ref Range   WBC 8.6 4.0 - 10.5 K/uL   RBC 2.94 (L) 3.87 - 5.11 MIL/uL   Hemoglobin 9.4 (L) 12.0 - 15.0 g/dL   HCT 19.1 (L) 47.8 - 29.5 %   MCV 101.0 (H) 80.0 - 100.0 fL   MCH 32.0 26.0 - 34.0 pg   MCHC 31.6 30.0 - 36.0 g/dL   RDW 62.1 30.8 - 65.7 %   Platelets 164 150 - 400 K/uL   nRBC 0.0 0.0 - 0.2 %    Comment: Performed at [facility], [address], [place], [place] [address]  Troponin I (High Sensitivity)     Status: None   Collection Time: 11/09/23  4:34 PM  Result Value Ref Range   Troponin I (High Sensitivity) 9 <18 ng/L    Comment:  (NOTE) Elevated high sensitivity troponin I (hsTnI) values and significant  changes across serial measurements may suggest ACS but many other  chronic and acute conditions are known to elevate hsTnI results.  Refer to the "Links" section for chest pain algorithms and additional  guidance. Performed at [facility], [address], [place], [place] [address]   Resp panel by RT-PCR (RSV, Flu A&B, Covid) Anterior Nasal Swab     Status: None   Collection Time: 11/09/23  4:34 PM   Specimen: Anterior Nasal Swab  Result Value Ref Range   SARS Coronavirus 2 by RT PCR NEGATIVE NEGATIVE   Influenza A by PCR NEGATIVE NEGATIVE   Influenza B by PCR NEGATIVE NEGATIVE    Comment: (NOTE) The Xpert Xpress SARS-CoV-2/FLU/RSV plus assay is intended as an aid in the diagnosis of influenza from Nasopharyngeal swab specimens and should not be used as a sole basis for treatment. Nasal washings and aspirates are unacceptable for Xpert Xpress SARS-CoV-2/FLU/RSV testing.  Fact Sheet for Patients: BloggerCourse.com  Fact Sheet for Healthcare Providers: SeriousBroker.it  This test is not yet approved or cleared by the [place] FDA and has been authorized for detection and/or diagnosis of SARS-CoV-2 by FDA under an Emergency Use Authorization (EUA). This EUA will remain in effect (meaning this test can be used) for the duration of the COVID-19 declaration under Section 564(b)(1) of the Act, 21 U.S.C. section 360bbb-3(b)(1), unless the authorization is terminated or revoked.     Resp Syncytial Virus by PCR  NEGATIVE NEGATIVE    Comment: (NOTE) Fact Sheet for Patients: BloggerCourse.com  Fact Sheet for Healthcare Providers: SeriousBroker.it  This test is not yet approved or cleared by the [place] FDA and has been authorized for detection and/or diagnosis of SARS-CoV-2 by FDA under an  Emergency Use Authorization (EUA). This EUA will remain in effect (meaning this test can be used) for the duration of the COVID-19 declaration under Section 564(b)(1) of the Act, 21 U.S.C. section 360bbb-3(b)(1), unless the authorization is terminated or revoked.  Performed at [facility], [address] [place], [place] [address]   Brain natriuretic peptide     Status: Abnormal   Collection Time: 11/09/23  4:34 PM  Result Value Ref Range   B Natriuretic Peptide 278.7 (H) 0.0 - 100.0 pg/mL    Comment: Performed at [facility], [address], [place], [place] [address]  Hemoglobin A1c     Status: Abnormal   Collection Time: 11/09/23  4:51 PM  Result Value Ref Range   Hgb A1c MFr Bld 5.7 (H) 4.8 - 5.6 %    Comment: (NOTE) Pre diabetes:          5.7%-6.4%  Diabetes:              >6.4%  Glycemic control for   <7.0% adults with diabetes    Mean Plasma Glucose 116.89 mg/dL    Comment: Performed at [facility], [address] [place], [place] [address]  Troponin I (High Sensitivity)     Status: None   Collection Time: 11/09/23  6:20 PM  Result Value Ref Range   Troponin I (High Sensitivity) 10 <18 ng/L    Comment: (NOTE) Elevated high sensitivity troponin I (hsTnI) values and significant  changes across serial measurements may suggest ACS but many other  chronic and acute conditions are known to elevate hsTnI results.  Refer to the "Links" section for chest pain algorithms and additional  guidance. Performed at [facility], [address], [place], [place] [address]   Magnesium     Status: None   Collection Time: 11/09/23  6:20 PM  Result Value Ref Range   Magnesium 2.0 1.7 - 2.4 mg/dL    Comment: Performed at [facility], [address] [place], [place] [address]  CBG monitoring, ED     Status: Abnormal   Collection Time: 11/09/23 11:02 PM  Result Value Ref Range   Glucose-Capillary 126 (H) 70 - 99 mg/dL    Comment: Glucose reference  range applies only to samples taken after fasting for at least 8 hours.  CBG monitoring, ED     Status: Abnormal   Collection Time: 11/10/23  2:07 AM  Result Value Ref Range   Glucose-Capillary 208 (H) 70 - 99 mg/dL    Comment: Glucose reference range applies only to samples taken after fasting for at least 8 hours.   *Note: Due to a large number of results and/or encounters for the requested time period, some results have not been displayed. A complete set of results can be found in Results Review.   CT CHEST ABDOMEN PELVIS WO CONTRAST Result Date: 11/09/2023 CLINICAL DATA:  Chest and abdominal pain EXAM: CT CHEST, ABDOMEN AND PELVIS WITHOUT CONTRAST TECHNIQUE: Multidetector CT imaging of the chest, abdomen and pelvis was performed following the standard protocol without IV contrast. RADIATION DOSE REDUCTION: This exam was performed according to the departmental dose-optimization program which includes automated exposure control, adjustment of  the mA and/or kV according to patient size and/or use of iterative reconstruction technique. COMPARISON:  06/18/2023 FINDINGS: CT CHEST FINDINGS Cardiovascular: Somewhat limited due to lack of IV contrast. Atherosclerotic calcifications of the aorta are noted. No aneurysmal dilatation is seen. Prior left atrial clipping is noted. Prior mitral and tricuspid valve surgery is noted. Mediastinum/Nodes: Thoracic inlet is within normal limits. No sizable hilar or mediastinal adenopathy is noted. The esophagus as visualized is within normal limits. Lungs/Pleura: Lungs are well aerated bilaterally. Areas of mosaic attenuation are noted consistent with air trapping. No focal infiltrate or sizable effusion is seen. No parenchymal nodule is noted. Musculoskeletal: Degenerative changes of the thoracic spine are noted. Chronic T11 compression deformity is noted. CT ABDOMEN PELVIS FINDINGS Hepatobiliary: No focal liver abnormality is seen. Status post cholecystectomy. No biliary  dilatation. Pancreas: Unremarkable. No pancreatic ductal dilatation or surrounding inflammatory changes. Spleen: Normal in size without focal abnormality. Adrenals/Urinary Tract: Adrenal glands are within normal limits. Kidneys are well visualize without evidence of renal calculi. 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 Bladder is decompressed. Stomach/Bowel: Appendix is within normal limits. No obstructive or inflammatory changes of the colon are seen. Stomach and small bowel are unremarkable. Vascular/Lymphatic: Aortic atherosclerosis. No enlarged abdominal or pelvic lymph nodes. Reproductive: Status post hysterectomy. No adnexal masses. Other: No abdominal wall hernia or abnormality. No abdominopelvic ascites. Musculoskeletal: No acute or significant osseous findings. IMPRESSION: CT of the chest: Changes consistent with air trapping. No acute abnormality is noted. Chronic T2-11 compression fracture. CT of the abdomen and pelvis: No acute abnormality noted. 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 These of been stable over multiple previous exams. Electronically Signed   By: Alcide Clever M.D.   On: 11/09/2023 20:28   DG Chest 2 View Result Date: 11/09/2023 CLINICAL DATA:  Chest pain.  Cough since Wednesday. EXAM: CHEST - 2 VIEW COMPARISON:  07/03/2023 FINDINGS: Lateral view degraded by patient arm position. Lower thoracic mild compression deformity is of indeterminate acuity. Prior median sternotomy. Mitral valve repair. Left atrial appendage occlusion device. Moderate to marked cardiomegaly. No pneumothorax. No pleural fluid. Suspect mild pulmonary venous congestion without overt congestive failure. Atherosclerosis in the transverse aorta. IMPRESSION: Cardiomegaly with mild pulmonary venous congestion. Aortic Atherosclerosis (ICD10-I70.0). Electronically Signed   By: Jeronimo Greaves M.D.   On: 11/09/2023 18:21    PMH:   Past  Medical History:  Diagnosis Date   Acute on chronic systolic CHF (congestive heart failure) (HCC) 12/21/2020   Allergic rhinitis    Anemia    Arthritis    Asthma    Chronic diastolic CHF (congestive heart failure) (HCC)    Chronic kidney disease    Dialysis Tu/TH/Sa   COPD (chronic obstructive pulmonary disease) (HCC)    COVID-19 03/28/2023   Depression    DM (diabetes mellitus) (HCC)    DVT (deep vein thrombosis) in pregnancy    Gastroenteritis 03/27/2023   GERD (gastroesophageal reflux disease)    HTN (hypertension)    Hyperlipidemia    Obesity    Pneumonia 04/2018   RIGHT LOBE   Sleep apnea    needs to be retested - to get new cpap    PSH:   Past Surgical History:  Procedure Laterality Date   ABDOMINAL HYSTERECTOMY     AV FISTULA PLACEMENT Left 03/17/2022   Procedure: LEFT ARM ARTERIOVENOUS (AV) FISTULA CREATION;  Surgeon: Chuck Hint, MD;  [facility] OR;  Service:  Vascular;  Laterality: Left;   BIOPSY  01/12/2023   Procedure: BIOPSY;  Surgeon: Jeani Hawking, MD;  Location: [facility] ENDOSCOPY;  Service: Gastroenterology;;   Thressa Sheller STUDY  11/26/2020   Procedure: BUBBLE STUDY;  Surgeon: Jodelle Red, MD;  Location: [facility] ENDOSCOPY;  Service: Cardiovascular;;   CARDIOVERSION N/A 05/06/2020   Procedure: CARDIOVERSION;  Surgeon: Jodelle Red, MD;  Location: [facility] ENDOSCOPY;  Service: Cardiovascular;  Laterality: N/A;   CARDIOVERSION N/A 11/04/2020   Procedure: CARDIOVERSION;  Surgeon: Lewayne Bunting, MD;  Location: [facility] ENDOSCOPY;  Service: Cardiovascular;  Laterality: N/A;   CARDIOVERSION N/A 02/01/2021   Procedure: CARDIOVERSION;  Surgeon: Dolores Patty, MD;  Location: [facility] ENDOSCOPY;  Service: Cardiovascular;  Laterality: N/A;   CARDIOVERSION N/A 02/09/2021   Procedure: CARDIOVERSION;  Surgeon: Dolores Patty, MD;  Location: [facility] ENDOSCOPY;  Service: Cardiovascular;  Laterality: N/A;   CARDIOVERSION N/A 04/19/2021   Procedure: CARDIOVERSION;  Surgeon:  Pricilla Riffle, MD;  Location: [facility] ENDOSCOPY;  Service: Cardiovascular;  Laterality: N/A;   CARDIOVERSION N/A 11/25/2021   Procedure: CARDIOVERSION;  Surgeon: Dolores Patty, MD;  Location: [facility] ENDOSCOPY;  Service: Cardiovascular;  Laterality: N/A;   CARDIOVERSION N/A 01/31/2022   Procedure: CARDIOVERSION;  Surgeon: Dolores Patty, MD;  Location: [facility] ENDOSCOPY;  Service: Cardiovascular;  Laterality: N/A;   CARDIOVERSION N/A 07/23/2023   Procedure: CARDIOVERSION (CATH LAB);  Surgeon: Thurmon Fair, MD;  Location: [facility] INVASIVE CV LAB;  Service: Cardiovascular;  Laterality: N/A;   CATARACT EXTRACTION, BILATERAL     CHOLECYSTECTOMY     COLONOSCOPY WITH PROPOFOL N/A 03/05/2015   Procedure: COLONOSCOPY WITH PROPOFOL;  Surgeon: Jeani Hawking, MD;  Location: [facility] ENDOSCOPY;  Service: Endoscopy;  Laterality: N/A;   DIAGNOSTIC LAPAROSCOPY     ESOPHAGOGASTRODUODENOSCOPY (EGD) WITH PROPOFOL N/A 01/12/2023   Procedure: ESOPHAGOGASTRODUODENOSCOPY (EGD) WITH PROPOFOL;  Surgeon: Jeani Hawking, MD;  Location: [facility] ENDOSCOPY;  Service: Gastroenterology;  Laterality: N/A;   FISTULA SUPERFICIALIZATION Left 03/17/2022   Procedure: FISTULA SUPERFICIALIZATION -LEFT;  Surgeon: Chuck Hint, MD;  Location: [facility] OR;  Service: Vascular;  Laterality: Left;   ingrown hallux Left    IR FLUORO GUIDE CV LINE RIGHT  03/14/2022   IR US GUIDE VASC ACCESS RIGHT  03/14/2022   KNEE SURGERY     LEFT HEART CATH AND CORONARY ANGIOGRAPHY N/A 12/31/2017   Procedure: LEFT HEART CATH AND CORONARY ANGIOGRAPHY;  Surgeon: Rinaldo Cloud, MD;  Location: [facility] INVASIVE CV LAB;  Service: Cardiovascular;  Laterality: N/A;   MAZE N/A 12/29/2020   Procedure: MAZE;  Surgeon: Loreli Slot, MD;  Location: [facility] OR;  Service: Open Heart Surgery;  Laterality: N/A;   MITRAL VALVE REPAIR N/A 12/29/2020   Procedure: MITRAL VALVE REPAIR USING CARBOMEDICS ANNULOFLEX RING SIZE 30;  Surgeon: Loreli Slot, MD;  Location: [facility] OR;  Service: Open  Heart Surgery;  Laterality: N/A;   REVISON OF ARTERIOVENOUS FISTULA Left 01/05/2023   Procedure: REVISION OF LEFT ARTERIOVENOUS FISTULA WITH INTERPOSITION PTFE GRAFT;  Surgeon: Chuck Hint, MD;  Location: [facility] OR;  Service: Vascular;  Laterality: Left;  ELIQUIS AND HOLD 48 X HOURS   RIGHT HEART CATH N/A 12/22/2020   Procedure: RIGHT HEART CATH;  Surgeon: Laurey Morale, MD;  Location: [facility] INVASIVE CV LAB;  Service: Cardiovascular;  Laterality: N/A;   RIGHT HEART CATH N/A 01/31/2021   Procedure: RIGHT HEART CATH;  Surgeon: Dolores Patty, MD;  Location: [facility] INVASIVE CV LAB;  Service: Cardiovascular;  Laterality: N/A;   RIGHT HEART CATH N/A 07/29/2021  Procedure: RIGHT HEART CATH;  Surgeon: Dolores Patty, MD;  Location: [facility] INVASIVE CV LAB;  Service: Cardiovascular;  Laterality: N/A;   RIGHT HEART CATH N/A 11/24/2021   Procedure: RIGHT HEART CATH;  Surgeon: Dolores Patty, MD;  Location: [facility] INVASIVE CV LAB;  Service: Cardiovascular;  Laterality: N/A;   RIGHT HEART CATH N/A 02/14/2022   Procedure: RIGHT HEART CATH;  Surgeon: Dolores Patty, MD;  Location: [facility] INVASIVE CV LAB;  Service: Cardiovascular;  Laterality: N/A;   RIGHT HEART CATH N/A 02/22/2022   Procedure: RIGHT HEART CATH;  Surgeon: Laurey Morale, MD;  Location: [facility] INVASIVE CV LAB;  Service: Cardiovascular;  Laterality: N/A;   TEE WITHOUT CARDIOVERSION N/A 11/26/2020   Procedure: TRANSESOPHAGEAL ECHOCARDIOGRAM (TEE);  Surgeon: Jodelle Red, MD;  Location: [facility] ENDOSCOPY;  Service: Cardiovascular;  Laterality: N/A;   TEE WITHOUT CARDIOVERSION N/A 12/29/2020   Procedure: TRANSESOPHAGEAL ECHOCARDIOGRAM (TEE);  Surgeon: Loreli Slot, MD;  Location: [facility] OR;  Service: Open Heart Surgery;  Laterality: N/A;   TEE WITHOUT CARDIOVERSION N/A 01/20/2022   Procedure: TRANSESOPHAGEAL ECHOCARDIOGRAM (TEE);  Surgeon: Dolores Patty, MD;  Location: [facility] ENDOSCOPY;  Service: Cardiovascular;  Laterality: N/A;   TRICUSPID  VALVE REPLACEMENT N/A 12/29/2020   Procedure: 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 RING SIZE 34;  Surgeon: Loreli Slot, MD;  Location: [facility] OR;  Service: Open Heart Surgery;  Laterality: N/A;    Allergies:  Allergies  Allergen Reactions   Bee Pollen Anaphylaxis and Other (See Comments)    UNCONFIRMED, but IS allergic to bee VENOM   Bee Venom Anaphylaxis   Sulfa Antibiotics Itching   Chlorhexidine    Iodinated Contrast Media Nausea And Vomiting    Medications:   Prior to Admission medications   Medication Sig Start Date End Date Taking? Authorizing Provider  acetaminophen (TYLENOL) 325 MG tablet Take 2 tablets (650 mg total) by mouth every 6 (six) hours as needed for mild pain or moderate pain. 12/07/21  Yes Arrien, York Ram, MD  albuterol (PROVENTIL HFA) 108 (90 Base) MCG/ACT inhaler Inhale 1-2 puffs into the lungs every 6 (six) hours as needed for wheezing or shortness of breath. Patient taking differently: Inhale 2 puffs into the lungs every 6 (six) hours as needed for wheezing or shortness of breath. 10/13/22  Yes Grayce Sessions, NP  amiodarone (PACERONE) 200 MG tablet TAKE 1 TABLET BY MOUTH EVERY DAY 05/21/23  Yes Bensimhon, Bevelyn Buckles, MD  busPIRone (BUSPAR) 5 MG tablet TAKE ONE TABLET BY MOUTH TWICE DAILY @ 9AM & 5PM 09/07/23  Yes Grayce Sessions, NP  diphenhydrAMINE (BENADRYL) 25 MG tablet Take 25 mg by mouth as needed. 03/31/22  Yes [provider]  DULoxetine (CYMBALTA) 60 MG capsule TAKE ONE CAPSULE (60MG  TOTAL) BY MOUTH DAILY AT 9AM 02/12/23  Yes Edwards, Michelle P, NP  ELIQUIS 5 MG TABS tablet TAKE ONE TABLET (5MG  TOTAL) BY MOUTH TWICE DAILY @9AM -5PM 09/06/23  Yes Bensimhon, Bevelyn Buckles, MD  EPINEPHrine 0.3 mg/0.3 mL IJ SOAJ injection Inject 0.3 mg into the muscle as needed for anaphylaxis. 06/25/20  Yes Grayce Sessions, NP  fluticasone [facility] ALLERGY RELIEF) 50 MCG/ACT nasal spray Place 2 sprays into both nostrils as needed. 03/31/22  Yes  [provider]  insulin glargine (LANTUS SOLOSTAR) 100 UNIT/ML Solostar Pen Inject 10 Units into the skin daily. 09/27/23  Yes Grayce Sessions, NP  insulin lispro (HUMALOG KWIKPEN) 100 UNIT/ML KwikPen For blood sugars 0-150 give 0 units of insulin, 151-200  give 2 units of insulin, 201-250 give 4 units, 251-300 give 6 units, 301-350 give 8 units, 351-400 give 10 units,> 400 give 12 units and call M.D. Patient taking differently: Inject 2-12 Units into the skin 2 (two) times daily as needed. For blood sugars 0-150 give 0 units of insulin, 151-200 give 2 units of insulin, 201-250 give 4 units, 251-300 give 6 units, 301-350 give 8 units, 351-400 give 10 units,> 400 give 12 units and call M.D. 09/27/23  Yes Hoy Register, MD  lidocaine-prilocaine (EMLA) cream Apply 1 Application topically Every Tuesday,Thursday,and Saturday with dialysis. 06/01/22  Yes [provider]  metoCLOPramide (REGLAN) 5 MG tablet Take 1 tablet (5 mg total) by mouth every 8 (eight) hours as needed for nausea. 07/10/23  Yes Grayce Sessions, NP  metoprolol tartrate (LOPRESSOR) 25 MG tablet Take 0.5 tablets (12.5 mg total) by mouth 2 (two) times daily. Patient taking differently: Take 12.5 mg by mouth daily. 03/30/23 12/19/23 Yes Noralee Stain, DO  midodrine (PROAMATINE) 10 MG tablet Take 1 tablet (10 mg total) by mouth See admin instructions. Take 10mg  (1 tablet) by mouth on Tuesday's, Thursday's, and Saturday's. 03/30/23  Yes Noralee Stain, DO  Multiple Vitamins-Minerals (MULTIVITAMIN WOMEN PO) Take 1 tablet by mouth daily.   Yes [provider]  nitroGLYCERIN (NITROSTAT) 0.4 MG SL tablet Place 1 tablet (0.4 mg total) under the tongue every 5 (five) minutes x 3 doses as needed for chest pain. 11/08/23  Yes Jodelle Red, MD  ondansetron (ZOFRAN) 8 MG tablet Take 8 mg by mouth 2 (two) times daily as needed. 06/10/23  Yes [provider]  pantoprazole (PROTONIX) 40 MG tablet Take 1 tablet  (40 mg total) by mouth daily. 04/24/23  Yes Grayce Sessions, NP  polyethylene glycol powder (MIRALAX) 17 GM/SCOOP powder Take by mouth as needed. 06/26/22  Yes [provider]  rosuvastatin (CRESTOR) 10 MG tablet TAKE ONE TABLET BY MOUTH DAILY AT 5PM Patient taking differently: Take 10 mg by mouth daily. 04/13/23  Yes Bensimhon, Bevelyn Buckles, MD    Discontinued Meds:   Medications Discontinued During This Encounter  Medication Reason   glucose blood (ACCU-CHEK GUIDE) test strip    Blood Glucose Monitoring Suppl (ACCU-CHEK GUIDE) w/Device KIT    Doxercalciferol (HECTOROL IV)    colchicine 0.6 MG tablet Discontinued by provider   linaclotide (LINZESS) 72 MCG capsule Patient Preference   loratadine (CLARITIN) 10 MG tablet Patient Preference   meclizine (ANTIVERT) 25 MG tablet Patient Preference    Social History:  reports that she has never smoked. She has never been exposed to tobacco smoke. She has never used smokeless tobacco. She reports that she does not drink alcohol and does not use drugs.  Family History:   Family History  Problem Relation Age of Onset   Breast cancer Mother    Cancer Mother    Hypertension Mother    Cancer Father    Hypertension Sister    Hypertension Brother    CAD Other     Blood pressure 103/71, pulse (!) 106, temperature 99.4 F (37.4 C), temperature source Oral, resp. rate (!) 29, SpO2 100%. General: alert , obese female, NAD Heart: Irregularly irregular, heart rate approximately 110s to 120s  Lungs: Rhonchi bilaterally, mildly increased respiratory rate  Abdomen: Obese, NABS, Soft ,NT, ND  Extremities: Trace  pedal edema  Dialysis Access: LUA AVF + bruit       Sherise Geerdes, Len Blalock, MD 11/10/2023, 4:55 AM

## 2023-11-10 NOTE — Progress Notes (Signed)
  Progress Note   Patient: [name] GEX:[id number] DOB: [birth year] DOA: 11/09/2023     1 DOS: the patient was seen and examined on 11/10/2023   Brief hospital course: 68 year old female with past medical history of ESRD [T/Th/Sat], atrial fibrillation, HTN, HLD, T2DM, COPD, DVT, OSA, asthma, NICM, sp TVR.  She presents with complaint of trouble breathing, coughing, chest pains since Wednesday.  Her shortness of breath has been getting progressively worse as has her chest pain.  She endorses palpitations, seizures and compliance with her medication.  She denies lightheadedness or dizziness.  Per patient yesterday told her that shortness of breath, the coughing, the chest pains.  She came to the ER.   In the ER presenting vitals 121/100, 123.  Troponin 9=>10, EKG A-fib with RVR heart rate 130, QTc 531.  Respiratory panel negative.  Patient given IV Cardizem without improvement.  Amiodarone drip initiated.  Cardiologist Dr. Regino Schultze consulted.  Assessment and Plan:  Atrial fibrillation with RVR (HCC) -Continued on amiodarone drip, eliquis, metoprolol -Cardiology following. Recs to continue amio gtt x 48hr, then transition back to oral 400mg  daily. Can be titrated down in outpt setting    Anxiety and depression -Cont BuSpar, Cymbalta    Chronic deep vein thrombosis (DVT) of other vein of right upper extremity  -cont eliquis as tolerated    COPD (chronic obstructive pulmonary disease) (HCC) -Flonase, inhaler as needed    Essential hypertension //  NICM //  sp TVR //  sp AVR -Metoprolol resumed -Eliquis resumed    HLD -Cont statin    T2DM //  gastroparesis -Continue Lantus 10 units subcu daily  -Continue Reglan -cont w/ SSI as needed    ESRD //  orthostatic hypotension -Nephrology following -Cont with HD as scheduled    OSA -CPAP ordered    Gout -Colchicine resumed    QT prolongation         Subjective: Complained of chest pains this AM  Physical Exam: Vitals:    11/10/23 0630 11/10/23 0740 11/10/23 0803 11/10/23 1253  BP: 93/66  116/69 103/63  Pulse: (!) 125  91 (!) 46  Resp: 20  19 16   Temp:  99 F (37.2 C) 98.1 F (36.7 C) 98 F (36.7 C)  TempSrc:  Oral Oral Oral  SpO2: 98%  96% 98%  Weight:   126.9 kg   Height:   5\' 7"  (1.702 m)    General exam: Awake, laying in bed, in nad Respiratory system: Normal respiratory effort, no wheezing Cardiovascular system: regular rate, s1, s2 Gastrointestinal system: Soft, nondistended, positive BS Central nervous system: CN2-12 grossly intact, strength intact Extremities: Perfused, no clubbing Skin: Normal skin turgor, no notable skin lesions seen Psychiatry: Mood normal // no visual hallucinations   Data Reviewed:  Labs reviewed: Na 136, K 4.2, WBC 8.8, Hgb 8.9  Family Communication: Pt in room, family not at bedside  Disposition: Status is: Inpatient Remains inpatient appropriate because: severity of illness  Planned Discharge Destination: Home    Author: Rickey Barbara, MD 11/10/2023 3:52 PM  For on call review [email or web address].

## 2023-11-11 DIAGNOSIS — I4891 Unspecified atrial fibrillation: Secondary | ICD-10-CM | POA: Diagnosis not present

## 2023-11-11 LAB — GLUCOSE, CAPILLARY
Glucose-Capillary: 107 mg/dL — ABNORMAL HIGH (ref 70–99)
Glucose-Capillary: 168 mg/dL — ABNORMAL HIGH (ref 70–99)
Glucose-Capillary: 109 mg/dL — ABNORMAL HIGH (ref 70–99)
Glucose-Capillary: 134 mg/dL — ABNORMAL HIGH (ref 70–99)

## 2023-11-11 LAB — COMPREHENSIVE METABOLIC PANEL
ALT: 9 U/L (ref 0–44)
AST: 15 U/L (ref 15–41)
Albumin: 3.2 g/dL — ABNORMAL LOW (ref 3.5–5.0)
Alkaline Phosphatase: 64 U/L (ref 38–126)
Anion gap: 17 — ABNORMAL HIGH (ref 5–15)
BUN: 38 mg/dL — ABNORMAL HIGH (ref 8–23)
CO2: 24 mmol/L (ref 22–32)
Calcium: 9.8 mg/dL (ref 8.9–10.3)
Chloride: 94 mmol/L — ABNORMAL LOW (ref 98–111)
Creatinine, Ser: 9.83 mg/dL — ABNORMAL HIGH (ref 0.44–1.00)
GFR, Estimated: 4 mL/min — ABNORMAL LOW (ref >60)
Glucose, Bld: 137 mg/dL — ABNORMAL HIGH (ref 70–99)
Potassium: 4.7 mmol/L (ref 3.5–5.1)
Sodium: 135 mmol/L (ref 135–145)
Total Bilirubin: 0.8 mg/dL (ref 0.0–1.2)
Total Protein: 6.9 g/dL (ref 6.5–8.1)

## 2023-11-11 LAB — MAGNESIUM: Magnesium: 2.1 mg/dL (ref 1.7–2.4)

## 2023-11-11 LAB — CBC
HCT: 28 % — ABNORMAL LOW (ref 36.0–46.0)
Hemoglobin: 9 g/dL — ABNORMAL LOW (ref 12.0–15.0)
MCH: 32 pg (ref 26.0–34.0)
MCHC: 32.1 g/dL (ref 30.0–36.0)
MCV: 99.6 fL (ref 80.0–100.0)
Platelets: 148 10*3/uL — ABNORMAL LOW (ref 150–400)
RBC: 2.81 MIL/uL — ABNORMAL LOW (ref 3.87–5.11)
RDW: 15.8 % — ABNORMAL HIGH (ref 11.5–15.5)
WBC: 7.8 10*3/uL (ref 4.0–10.5)
nRBC: 0 % (ref 0.0–0.2)

## 2023-11-11 MED ORDER — LIDOCAINE-PRILOCAINE 2.5-2.5 % EX CREA: 1.0000 | TOPICAL_CREAM | CUTANEOUS | Status: DC | PRN

## 2023-11-11 MED ORDER — HEPARIN SODIUM (PORCINE) 1000 UNIT/ML IJ SOLN: 3200.0000 [IU] | Freq: Once | INTRAMUSCULAR | Status: DC

## 2023-11-11 MED ORDER — ALTEPLASE 2 MG IJ SOLR: 2.0000 mg | Freq: Once | INTRAMUSCULAR | Status: DC | PRN

## 2023-11-11 MED ORDER — ALBUMIN HUMAN 25 % IV SOLN
25.0000 g | Freq: Once | INTRAVENOUS | Status: AC
  Administered 2023-11-11: 25 g via INTRAVENOUS

## 2023-11-11 MED ORDER — HEPARIN SODIUM (PORCINE) 1000 UNIT/ML DIALYSIS
2500.0000 [IU] | Freq: Once | INTRAMUSCULAR | Status: AC
Start: 2023-11-11 — End: 2023-11-11
  Administered 2023-11-11: 2500 [IU] via INTRAVENOUS_CENTRAL

## 2023-11-11 MED ORDER — HEPARIN SODIUM (PORCINE) 1000 UNIT/ML DIALYSIS: 1000.0000 [IU] | INTRAMUSCULAR | Status: DC | PRN

## 2023-11-11 MED ORDER — LIDOCAINE HCL (PF) 1 % IJ SOLN: 5.0000 mL | INTRAMUSCULAR | Status: DC | PRN

## 2023-11-11 MED ORDER — PENTAFLUOROPROP-TETRAFLUOROETH EX AERO: 1.0000 | INHALATION_SPRAY | CUTANEOUS | Status: DC | PRN

## 2023-11-11 MED ORDER — ANTICOAGULANT SODIUM CITRATE 4% (200MG/5ML) IV SOLN: 5.0000 mL | Status: DC | PRN

## 2023-11-11 NOTE — Progress Notes (Signed)
 Received patient in bed.Alert and oriented x 4.Consent verified.  Access used : Left arm avf that worked well.  Duration of treatment: 3 hours.  Net uf goal. Made even due to patient low bp.  Medicine given: Albumin 25 g.  Hand off to the patient's nurse,back into her room with stable medical condition via transporter.

## 2023-11-11 NOTE — Progress Notes (Signed)
 [name] is an 68 y.o. female  with afib, HTN, HLD, DM COPD, DVT, OSA, asthma, NICM, status post TAVR, ESRD dialyzing Tuesday Thursdays and Saturdays at [facility] with Dr. Thedore Mins.  Patient with sick contact at HD, coughing next to her. Here with cough, CP worse with deep inspiration. Found to be in atrial fibrillation with RVR heart rate 150 initially.  Amiodarone drip was initiated after failing rate control with IV Cardizem.   Dialysis Orders: SW TTS  4h  400/600   124.4 kg  3K/2.5Ca bath AVF   Heparin 2500 - last OP HD 3/20, usually getting to or close to dry wt - hectorol 3 mcg  - venofer 50 mg qweekly - mircera 150 mcg IV q 2, last on 3/19   Assessment/Plan: ESRD - HD  on admit overnight, essentially still on TTS schedule last treatment on Thursday.  Will plan on dialyzing today; unit was swamped and  she was a hold over from Saturday.  I have already contacted the on-call HD RN and noted she is priority   URI per primary. Chronic combined systolic and diastolic CHF  with peripheral patient seen by cardiology. Hypertension/volume  -  BP controlled.  A Fib at home on eliquis seen by cardiology. Patient has been cardioverted in the past last time July 23, 2023, on DOAC, prior MAZE.  The on amiodarone drip, RVR thought to be triggered by URI per cardiology. Anemia  - Hgb 9.4 (not due for ESA), dose on 3/19 Metabolic bone disease -   check a phos for binder management.  In past Vel phoro binder but gi distress , not on binder for now will fu  phos trend as op.  8. Nutrition - renal diet, carb modified.  9  DM type 2 - stable.  Plan per primary  Subjective: Not feeling great but denies nausea vomiting.  Mild shortness of breath, cough present.   Chemistry and CBC: Creatinine, Ser  Date/Time Value Ref Range Status  11/11/2023 04:00 AM 9.83 (H) 0.44 - 1.00 mg/dL Final  16/05/9603 54:09 AM 7.68 (H) 0.44 - 1.00 mg/dL Final  81/19/1478 29:56 PM 6.54 (H) 0.44 - 1.00 mg/dL  Final  21/30/8657 84:69 AM 10.82 (H) 0.44 - 1.00 mg/dL Final  62/95/2841 32:44 AM 14.65 (H) 0.44 - 1.00 mg/dL Final  08/23/7251 66:44 AM 13.25 (H) 0.44 - 1.00 mg/dL Final  03/47/4259 56:38 PM 10.60 (H) 0.44 - 1.00 mg/dL Final  75/64/3329 51:88 PM 9.32 (H) 0.44 - 1.00 mg/dL Final  41/66/0630 16:01 AM 7.03 (H) 0.44 - 1.00 mg/dL Final  09/32/3557 32:20 PM 10.54 (H) 0.44 - 1.00 mg/dL Final    Comment:    DIALYSIS DELTA CHECK NOTED   03/27/2023 09:46 AM 6.39 (H) 0.44 - 1.00 mg/dL Final  25/42/7062 37:62 AM 6.69 (H) 0.44 - 1.00 mg/dL Final  83/15/1761 60:73 AM 8.60 (H) 0.44 - 1.00 mg/dL Final  71/01/2693 85:46 AM 6.49 (H) 0.44 - 1.00 mg/dL Final  27/10/5007 38:18 AM 8.64 (H) 0.44 - 1.00 mg/dL Final  29/93/7169 67:89 PM 7.48 (H) 0.44 - 1.00 mg/dL Final  38/05/1750 02:58 AM 5.44 (H) 0.44 - 1.00 mg/dL Final  52/77/8242 35:36 PM 10.35 (H) 0.57 - 1.00 mg/dL Final    Comment:    **Verified by repeat analysis**  01/12/2023 08:29 AM 7.50 (H) 0.44 - 1.00 mg/dL Final  14/43/1540 08:67 PM 4.49 (H) 0.44 - 1.00 mg/dL Final  61/95/0932 67:12 AM 6.70 (H) 0.44 - 1.00 mg/dL Final  45/80/9983  10:58 AM 5.25 (H) 0.44 - 1.00 mg/dL Final  86/57/8469 62:95 PM 3.92 (H) 0.44 - 1.00 mg/dL Final  28/41/3244 01:02 PM 7.30 (H) 0.44 - 1.00 mg/dL Final  72/53/6644 03:47 AM 8.20 (H) 0.44 - 1.00 mg/dL Final  42/59/5638 75:64 PM 6.65 (H) 0.44 - 1.00 mg/dL Final  33/29/5188 41:66 PM 8.80 (H) 0.44 - 1.00 mg/dL Final  02/18/1600 09:32 PM 8.08 (H) 0.44 - 1.00 mg/dL Final  35/57/3220 25:42 AM 7.26 (H) 0.44 - 1.00 mg/dL Final  70/62/3762 83:15 PM 8.99 (H) 0.44 - 1.00 mg/dL Final  17/61/6073 71:06 AM 7.19 (H) 0.44 - 1.00 mg/dL Final  26/94/8546 27:03 PM 5.38 (H) 0.44 - 1.00 mg/dL Final  50/04/3817 29:93 AM 7.75 (H) 0.44 - 1.00 mg/dL Final  71/69/6789 38:10 PM 2.16 (H) 0.44 - 1.00 mg/dL Final  17/51/0258 52:77 AM 4.43 (H) 0.44 - 1.00 mg/dL Final  82/42/3536 14:43 AM 4.62 (H) 0.44 - 1.00 mg/dL Final  15/40/0867 61:95 AM 4.62  (H) 0.44 - 1.00 mg/dL Final  09/32/6712 45:80 AM 3.90 (H) 0.44 - 1.00 mg/dL Final  99/83/3825 05:39 AM 5.47 (H) 0.44 - 1.00 mg/dL Final  76/73/4193 79:02 AM 6.70 (H) 0.44 - 1.00 mg/dL Final  40/97/3532 99:24 AM 6.78 (H) 0.44 - 1.00 mg/dL Final  26/83/4196 22:29 AM 6.41 (H) 0.44 - 1.00 mg/dL Final  79/89/2119 41:74 AM 4.77 (H) 0.44 - 1.00 mg/dL Final    Comment:    DELTA CHECK NOTED  03/10/2022 02:33 AM 3.12 (H) 0.44 - 1.00 mg/dL Final  04/03/4817 56:31 PM 2.83 (H) 0.44 - 1.00 mg/dL Final  49/70/2637 85:88 AM 2.46 (H) 0.44 - 1.00 mg/dL Final  50/27/7412 87:86 PM 2.46 (H) 0.44 - 1.00 mg/dL Final  76/72/0947 09:62 AM 3.33 (H) 0.44 - 1.00 mg/dL Final  83/66/2947 65:46 AM 3.57 (H) 0.44 - 1.00 mg/dL Final  50/35/4656 81:27 AM 3.19 (H) 0.44 - 1.00 mg/dL Final  51/70/0174 94:49 AM 3.22 (H) 0.44 - 1.00 mg/dL Final  67/59/1638 46:65 AM 3.29 (H) 0.44 - 1.00 mg/dL Final   Recent Labs  Lab 11/09/23 1634 11/10/23 0620 11/11/23 0400  NA 139 136 135  K 4.2 4.2 4.7  CL 93* 94* 94*  CO2 28 27 24   GLUCOSE 115* 179* 137*  BUN 22 29* 38*  CREATININE 6.54* 7.68* 9.83*  CALCIUM 10.1 9.8 9.8   Recent Labs  Lab 11/09/23 1634 11/10/23 0620 11/11/23 0400  WBC 8.6 8.8 7.8  NEUTROABS  --  5.6  --   HGB 9.4* 8.9* 9.0*  HCT 29.7* 28.9* 28.0*  MCV 101.0* 103.2* 99.6  PLT 164 150 148*   Liver Function Tests: Recent Labs  Lab 11/11/23 0400  AST 15  ALT 9  ALKPHOS 64  BILITOT 0.8  PROT 6.9  ALBUMIN 3.2*   No results for input(s): "LIPASE", "AMYLASE" in the last 168 hours. No results for input(s): "AMMONIA" in the last 168 hours. Cardiac Enzymes: No results for input(s): "CKTOTAL", "CKMB", "CKMBINDEX", "TROPONINI" in the last 168 hours. Iron Studies: No results for input(s): "IRON", "TIBC", "TRANSFERRIN", "FERRITIN" in the last 72 hours. PT/INR: @LABRCNTIP (inr:5)  Xrays/Other Studies: ) Results for orders placed or performed during the hospital encounter of 11/09/23 (from the past 48  hours)  Basic metabolic panel     Status: Abnormal   Collection Time: 11/09/23  4:34 PM  Result Value Ref Range   Sodium 139 135 - 145 mmol/L   Potassium 4.2 3.5 - 5.1 mmol/L   Chloride 93 (L) 98 -  111 mmol/L   CO2 28 22 - 32 mmol/L   Glucose, Bld 115 (H) 70 - 99 mg/dL    Comment: Glucose reference range applies only to samples taken after fasting for at least 8 hours.   BUN 22 8 - 23 mg/dL   Creatinine, Ser 1.61 (H) 0.44 - 1.00 mg/dL   Calcium 09.6 8.9 - 04.5 mg/dL   GFR, Estimated 6 (L) >60 mL/min    Comment: (NOTE) Calculated using the CKD-EPI Creatinine Equation (2021)    Anion gap 18 (H) 5 - 15    Comment: Performed at [facility], [address], [place], [place] [address]  CBC     Status: Abnormal   Collection Time: 11/09/23  4:34 PM  Result Value Ref Range   WBC 8.6 4.0 - 10.5 K/uL   RBC 2.94 (L) 3.87 - 5.11 MIL/uL   Hemoglobin 9.4 (L) 12.0 - 15.0 g/dL   HCT 19.1 (L) 47.8 - 29.5 %   MCV 101.0 (H) 80.0 - 100.0 fL   MCH 32.0 26.0 - 34.0 pg   MCHC 31.6 30.0 - 36.0 g/dL   RDW 62.1 30.8 - 65.7 %   Platelets 164 150 - 400 K/uL   nRBC 0.0 0.0 - 0.2 %    Comment: Performed at [facility], [address] [place], [place] [address]  Troponin I (High Sensitivity)     Status: None   Collection Time: 11/09/23  4:34 PM  Result Value Ref Range   Troponin I (High Sensitivity) 9 <18 ng/L    Comment: (NOTE) Elevated high sensitivity troponin I (hsTnI) values and significant  changes across serial measurements may suggest ACS but many other  chronic and acute conditions are known to elevate hsTnI results.  Refer to the "Links" section for chest pain algorithms and additional  guidance. Performed at [facility], [address], [place], [place] [address]   Resp panel by RT-PCR (RSV, Flu A&B, Covid) Anterior Nasal Swab     Status: None   Collection Time: 11/09/23  4:34 PM   Specimen: Anterior Nasal Swab  Result Value Ref Range   SARS Coronavirus 2 by RT PCR  NEGATIVE NEGATIVE   Influenza A by PCR NEGATIVE NEGATIVE   Influenza B by PCR NEGATIVE NEGATIVE    Comment: (NOTE) The Xpert Xpress SARS-CoV-2/FLU/RSV plus assay is intended as an aid in the diagnosis of influenza from Nasopharyngeal swab specimens and should not be used as a sole basis for treatment. Nasal washings and aspirates are unacceptable for Xpert Xpress SARS-CoV-2/FLU/RSV testing.  Fact Sheet for Patients: BloggerCourse.com  Fact Sheet for Healthcare Providers: SeriousBroker.it  This test is not yet approved or cleared by the [place] FDA and has been authorized for detection and/or diagnosis of SARS-CoV-2 by FDA under an Emergency Use Authorization (EUA). This EUA will remain in effect (meaning this test can be used) for the duration of the COVID-19 declaration under Section 564(b)(1) of the Act, 21 U.S.C. section 360bbb-3(b)(1), unless the authorization is terminated or revoked.     Resp Syncytial Virus by PCR NEGATIVE NEGATIVE    Comment: (NOTE) Fact Sheet for Patients: BloggerCourse.com  Fact Sheet for Healthcare Providers: SeriousBroker.it  This test is not yet approved or cleared by the [place] FDA and has been authorized for detection and/or diagnosis of SARS-CoV-2 by FDA under an Emergency Use Authorization (EUA). This EUA will remain in effect (meaning this test can be used) for the duration of the  COVID-19 declaration under Section 564(b)(1) of the Act, 21 U.S.C. section 360bbb-3(b)(1), unless the authorization is terminated or revoked.  Performed at [facility], [address] [place], [place] [address]   Brain natriuretic peptide     Status: Abnormal   Collection Time: 11/09/23  4:34 PM  Result Value Ref Range   B Natriuretic Peptide 278.7 (H) 0.0 - 100.0 pg/mL    Comment: Performed at [facility], [address]  [place], [place] [address]  Hemoglobin A1c     Status: Abnormal   Collection Time: 11/09/23  4:51 PM  Result Value Ref Range   Hgb A1c MFr Bld 5.7 (H) 4.8 - 5.6 %    Comment: (NOTE) Pre diabetes:          5.7%-6.4%  Diabetes:              >6.4%  Glycemic control for   <7.0% adults with diabetes    Mean Plasma Glucose 116.89 mg/dL    Comment: Performed at [facility], [address] [place], [place] [address]  Troponin I (High Sensitivity)     Status: None   Collection Time: 11/09/23  6:20 PM  Result Value Ref Range   Troponin I (High Sensitivity) 10 <18 ng/L    Comment: (NOTE) Elevated high sensitivity troponin I (hsTnI) values and significant  changes across serial measurements may suggest ACS but many other  chronic and acute conditions are known to elevate hsTnI results.  Refer to the "Links" section for chest pain algorithms and additional  guidance. Performed at [facility], [address], [place], [place] [address]   Magnesium     Status: None   Collection Time: 11/09/23  6:20 PM  Result Value Ref Range   Magnesium 2.0 1.7 - 2.4 mg/dL    Comment: Performed at [facility], [address], [place], [place] [address]  CBG monitoring, ED     Status: Abnormal   Collection Time: 11/09/23 11:02 PM  Result Value Ref Range   Glucose-Capillary 126 (H) 70 - 99 mg/dL    Comment: Glucose reference range applies only to samples taken after fasting for at least 8 hours.  CBG monitoring, ED     Status: Abnormal   Collection Time: 11/10/23  2:07 AM  Result Value Ref Range   Glucose-Capillary 208 (H) 70 - 99 mg/dL    Comment: Glucose reference range applies only to samples taken after fasting for at least 8 hours.  Basic metabolic panel     Status: Abnormal   Collection Time: 11/10/23  6:20 AM  Result Value Ref Range   Sodium 136 135 - 145 mmol/L   Potassium 4.2 3.5 - 5.1 mmol/L   Chloride 94 (L) 98 - 111 mmol/L   CO2 27 22 - 32 mmol/L   Glucose, Bld 179 (H) 70  - 99 mg/dL    Comment: Glucose reference range applies only to samples taken after fasting for at least 8 hours.   BUN 29 (H) 8 - 23 mg/dL   Creatinine, Ser 0.10 (H) 0.44 - 1.00 mg/dL   Calcium 9.8 8.9 - 27.2 mg/dL   GFR, Estimated 5 (L) >60 mL/min    Comment: (NOTE) Calculated using the CKD-EPI Creatinine Equation (2021)    Anion gap 15 5 - 15    Comment: Performed at [facility], [address] [place], [place] [address]  CBC with Differential/Platelet     Status:  Abnormal   Collection Time: 11/10/23  6:20 AM  Result Value Ref Range   WBC 8.8 4.0 - 10.5 K/uL   RBC 2.80 (L) 3.87 - 5.11 MIL/uL   Hemoglobin 8.9 (L) 12.0 - 15.0 g/dL   HCT 60.4 (L) 54.0 - 98.1 %   MCV 103.2 (H) 80.0 - 100.0 fL   MCH 31.8 26.0 - 34.0 pg   MCHC 30.8 30.0 - 36.0 g/dL   RDW 19.1 (H) 47.8 - 29.5 %   Platelets 150 150 - 400 K/uL   nRBC 0.0 0.0 - 0.2 %   Neutrophils Relative % 63 %   Neutro Abs 5.6 1.7 - 7.7 K/uL   Lymphocytes Relative 15 %   Lymphs Abs 1.3 0.7 - 4.0 K/uL   Monocytes Relative 17 %   Monocytes Absolute 1.5 (H) 0.1 - 1.0 K/uL   Eosinophils Relative 3 %   Eosinophils Absolute 0.3 0.0 - 0.5 K/uL   Basophils Relative 1 %   Basophils Absolute 0.0 0.0 - 0.1 K/uL   Immature Granulocytes 1 %   Abs Immature Granulocytes 0.05 0.00 - 0.07 K/uL    Comment: Performed at [facility], [address], [place], [place] [address]  Hepatitis B surface antigen     Status: None   Collection Time: 11/10/23  6:20 AM  Result Value Ref Range   Hepatitis B Surface Ag NON REACTIVE NON REACTIVE    Comment: Performed at [facility], [address], [place], [place] [address]  Glucose, capillary     Status: Abnormal   Collection Time: 11/10/23  8:21 AM  Result Value Ref Range   Glucose-Capillary 115 (H) 70 - 99 mg/dL    Comment: Glucose reference range applies only to samples taken after fasting for at least 8 hours.  Glucose, capillary     Status: Abnormal   Collection Time: 11/10/23  12:47 PM  Result Value Ref Range   Glucose-Capillary 122 (H) 70 - 99 mg/dL    Comment: Glucose reference range applies only to samples taken after fasting for at least 8 hours.  Glucose, capillary     Status: Abnormal   Collection Time: 11/10/23  4:14 PM  Result Value Ref Range   Glucose-Capillary 138 (H) 70 - 99 mg/dL    Comment: Glucose reference range applies only to samples taken after fasting for at least 8 hours.  Glucose, capillary     Status: Abnormal   Collection Time: 11/10/23  9:05 PM  Result Value Ref Range   Glucose-Capillary 132 (H) 70 - 99 mg/dL    Comment: Glucose reference range applies only to samples taken after fasting for at least 8 hours.  Comprehensive metabolic panel     Status: Abnormal   Collection Time: 11/11/23  4:00 AM  Result Value Ref Range   Sodium 135 135 - 145 mmol/L   Potassium 4.7 3.5 - 5.1 mmol/L   Chloride 94 (L) 98 - 111 mmol/L   CO2 24 22 - 32 mmol/L   Glucose, Bld 137 (H) 70 - 99 mg/dL    Comment: Glucose reference range applies only to samples taken after fasting for at least 8 hours.   BUN 38 (H) 8 - 23 mg/dL   Creatinine, Ser 4.69 (H) 0.44 - 1.00 mg/dL   Calcium 9.8 8.9 - 62.9 mg/dL   Total Protein 6.9 6.5 - 8.1 g/dL   Albumin 3.2 (L) 3.5 - 5.0 g/dL   AST 15 15 - 41 U/L  ALT 9 0 - 44 U/L   Alkaline Phosphatase 64 38 - 126 U/L   Total Bilirubin 0.8 0.0 - 1.2 mg/dL   GFR, Estimated 4 (L) >60 mL/min    Comment: (NOTE) Calculated using the CKD-EPI Creatinine Equation (2021)    Anion gap 17 (H) 5 - 15    Comment: Performed at [facility], [address], [place], [place] [address]  CBC     Status: Abnormal   Collection Time: 11/11/23  4:00 AM  Result Value Ref Range   WBC 7.8 4.0 - 10.5 K/uL   RBC 2.81 (L) 3.87 - 5.11 MIL/uL   Hemoglobin 9.0 (L) 12.0 - 15.0 g/dL   HCT 60.4 (L) 54.0 - 98.1 %   MCV 99.6 80.0 - 100.0 fL   MCH 32.0 26.0 - 34.0 pg   MCHC 32.1 30.0 - 36.0 g/dL   RDW 19.1 (H) 47.8 - 29.5 %   Platelets 148 (L)  150 - 400 K/uL   nRBC 0.0 0.0 - 0.2 %    Comment: Performed at [facility], [address] [place], [place] [address]  Magnesium     Status: None   Collection Time: 11/11/23  4:00 AM  Result Value Ref Range   Magnesium 2.1 1.7 - 2.4 mg/dL    Comment: Performed at [facility], [address], [place], [place] [address]  Glucose, capillary     Status: Abnormal   Collection Time: 11/11/23  7:19 AM  Result Value Ref Range   Glucose-Capillary 107 (H) 70 - 99 mg/dL    Comment: Glucose reference range applies only to samples taken after fasting for at least 8 hours.   *Note: Due to a large number of results and/or encounters for the requested time period, some results have not been displayed. A complete set of results can be found in Results Review.   CT CHEST ABDOMEN PELVIS WO CONTRAST Result Date: 11/09/2023 CLINICAL DATA:  Chest and abdominal pain EXAM: CT CHEST, ABDOMEN AND PELVIS WITHOUT CONTRAST TECHNIQUE: Multidetector CT imaging of the chest, abdomen and pelvis was performed following the standard protocol without IV contrast. RADIATION DOSE REDUCTION: This exam was performed according to the departmental dose-optimization program which includes automated exposure control, adjustment of the mA and/or kV according to patient size and/or use of iterative reconstruction technique. COMPARISON:  06/18/2023 FINDINGS: CT CHEST FINDINGS Cardiovascular: Somewhat limited due to lack of IV contrast. Atherosclerotic calcifications of the aorta are noted. No aneurysmal dilatation is seen. Prior left atrial clipping is noted. Prior mitral and tricuspid valve surgery is noted. Mediastinum/Nodes: Thoracic inlet is within normal limits. No sizable hilar or mediastinal adenopathy is noted. The esophagus as visualized is within normal limits. Lungs/Pleura: Lungs are well aerated bilaterally. Areas of mosaic attenuation are noted consistent with air trapping. No focal infiltrate or sizable effusion is  seen. No parenchymal nodule is noted. Musculoskeletal: Degenerative changes of the thoracic spine are noted. Chronic T11 compression deformity is noted. CT ABDOMEN PELVIS FINDINGS Hepatobiliary: No focal liver abnormality is seen. Status post cholecystectomy. No biliary dilatation. Pancreas: Unremarkable. No pancreatic ductal dilatation or surrounding inflammatory changes. Spleen: Normal in size without focal abnormality. Adrenals/Urinary Tract: Adrenal glands are within normal limits. Kidneys are well visualize without evidence of renal calculi. 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 Bladder is decompressed. Stomach/Bowel: Appendix is within normal limits. No obstructive or inflammatory changes of the colon are seen. Stomach  and small bowel are unremarkable. Vascular/Lymphatic: Aortic atherosclerosis. No enlarged abdominal or pelvic lymph nodes. Reproductive: Status post hysterectomy. No adnexal masses. Other: No abdominal wall hernia or abnormality. No abdominopelvic ascites. Musculoskeletal: No acute or significant osseous findings. IMPRESSION: CT of the chest: Changes consistent with air trapping. No acute abnormality is noted. Chronic T2-11 compression fracture. CT of the abdomen and pelvis: No acute abnormality noted. 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 These of been stable over multiple previous exams. Electronically Signed   By: Alcide Clever M.D.   On: 11/09/2023 20:28   DG Chest 2 View Result Date: 11/09/2023 CLINICAL DATA:  Chest pain.  Cough since Wednesday. EXAM: CHEST - 2 VIEW COMPARISON:  07/03/2023 FINDINGS: Lateral view degraded by patient arm position. Lower thoracic mild compression deformity is of indeterminate acuity. Prior median sternotomy. Mitral valve repair. Left atrial appendage occlusion device. Moderate to marked cardiomegaly. No pneumothorax. No pleural fluid. Suspect mild pulmonary venous  congestion without overt congestive failure. Atherosclerosis in the transverse aorta. IMPRESSION: Cardiomegaly with mild pulmonary venous congestion. Aortic Atherosclerosis (ICD10-I70.0). Electronically Signed   By: Jeronimo Greaves M.D.   On: 11/09/2023 18:21    PMH:   Past Medical History:  Diagnosis Date   Acute on chronic systolic CHF (congestive heart failure) (HCC) 12/21/2020   Allergic rhinitis    Anemia    Arthritis    Asthma    Chronic diastolic CHF (congestive heart failure) (HCC)    Chronic kidney disease    Dialysis Tu/TH/Sa   COPD (chronic obstructive pulmonary disease) (HCC)    COVID-19 03/28/2023   Depression    DM (diabetes mellitus) (HCC)    DVT (deep vein thrombosis) in pregnancy    Gastroenteritis 03/27/2023   GERD (gastroesophageal reflux disease)    HTN (hypertension)    Hyperlipidemia    Obesity    Pneumonia 04/2018   RIGHT LOBE   Sleep apnea    needs to be retested - to get new cpap    PSH:   Past Surgical History:  Procedure Laterality Date   ABDOMINAL HYSTERECTOMY     AV FISTULA PLACEMENT Left 03/17/2022   Procedure: LEFT ARM ARTERIOVENOUS (AV) FISTULA CREATION;  Surgeon: Chuck Hint, MD;  Location: [facility] OR;  Service: Vascular;  Laterality: Left;   BIOPSY  01/12/2023   Procedure: BIOPSY;  Surgeon: Jeani Hawking, MD;  Location: [facility] ENDOSCOPY;  Service: Gastroenterology;;   Thressa Sheller STUDY  11/26/2020   Procedure: BUBBLE STUDY;  Surgeon: Jodelle Red, MD;  Location: [facility] ENDOSCOPY;  Service: Cardiovascular;;   CARDIOVERSION N/A 05/06/2020   Procedure: CARDIOVERSION;  Surgeon: Jodelle Red, MD;  Location: [facility] ENDOSCOPY;  Service: Cardiovascular;  Laterality: N/A;   CARDIOVERSION N/A 11/04/2020   Procedure: CARDIOVERSION;  Surgeon: Lewayne Bunting, MD;  Location: [facility] ENDOSCOPY;  Service: Cardiovascular;  Laterality: N/A;   CARDIOVERSION N/A 02/01/2021   Procedure: CARDIOVERSION;  Surgeon: Dolores Patty, MD;  Location: [facility] ENDOSCOPY;   Service: Cardiovascular;  Laterality: N/A;   CARDIOVERSION N/A 02/09/2021   Procedure: CARDIOVERSION;  Surgeon: Dolores Patty, MD;  Location: [facility] ENDOSCOPY;  Service: Cardiovascular;  Laterality: N/A;   CARDIOVERSION N/A 04/19/2021   Procedure: CARDIOVERSION;  Surgeon: Pricilla Riffle, MD;  Location: [facility] ENDOSCOPY;  Service: Cardiovascular;  Laterality: N/A;   CARDIOVERSION N/A 11/25/2021   Procedure: CARDIOVERSION;  Surgeon: Dolores Patty, MD;  Location: [facility] ENDOSCOPY;  Service: Cardiovascular;  Laterality: N/A;   CARDIOVERSION N/A 01/31/2022   Procedure: CARDIOVERSION;  Surgeon: Bensimhon,  Bevelyn Buckles, MD;  Location: [facility] ENDOSCOPY;  Service: Cardiovascular;  Laterality: N/A;   CARDIOVERSION N/A 07/23/2023   Procedure: CARDIOVERSION (CATH LAB);  Surgeon: Thurmon Fair, MD;  Location: [facility] INVASIVE CV LAB;  Service: Cardiovascular;  Laterality: N/A;   CATARACT EXTRACTION, BILATERAL     CHOLECYSTECTOMY     COLONOSCOPY WITH PROPOFOL N/A 03/05/2015   Procedure: COLONOSCOPY WITH PROPOFOL;  Surgeon: Jeani Hawking, MD;  Location: [facility] ENDOSCOPY;  Service: Endoscopy;  Laterality: N/A;   DIAGNOSTIC LAPAROSCOPY     ESOPHAGOGASTRODUODENOSCOPY (EGD) WITH PROPOFOL N/A 01/12/2023   Procedure: ESOPHAGOGASTRODUODENOSCOPY (EGD) WITH PROPOFOL;  Surgeon: Jeani Hawking, MD;  Location: [facility] ENDOSCOPY;  Service: Gastroenterology;  Laterality: N/A;   FISTULA SUPERFICIALIZATION Left 03/17/2022   Procedure: FISTULA SUPERFICIALIZATION -LEFT;  Surgeon: Chuck Hint, MD;  Location: [facility] OR;  Service: Vascular;  Laterality: Left;   ingrown hallux Left    IR FLUORO GUIDE CV LINE RIGHT  03/14/2022   IR US GUIDE VASC ACCESS RIGHT  03/14/2022   KNEE SURGERY     LEFT HEART CATH AND CORONARY ANGIOGRAPHY N/A 12/31/2017   Procedure: LEFT HEART CATH AND CORONARY ANGIOGRAPHY;  Surgeon: Rinaldo Cloud, MD;  Location: [facility] INVASIVE CV LAB;  Service: Cardiovascular;  Laterality: N/A;   MAZE N/A 12/29/2020   Procedure: MAZE;  Surgeon:  Loreli Slot, MD;  Location: [facility] OR;  Service: Open Heart Surgery;  Laterality: N/A;   MITRAL VALVE REPAIR N/A 12/29/2020   Procedure: MITRAL VALVE REPAIR USING CARBOMEDICS ANNULOFLEX RING SIZE 30;  Surgeon: Loreli Slot, MD;  Location: [facility] OR;  Service: Open Heart Surgery;  Laterality: N/A;   REVISON OF ARTERIOVENOUS FISTULA Left 01/05/2023   Procedure: REVISION OF LEFT ARTERIOVENOUS FISTULA WITH INTERPOSITION PTFE GRAFT;  Surgeon: Chuck Hint, MD;  Location: [facility] OR;  Service: Vascular;  Laterality: Left;  ELIQUIS AND HOLD 48 X HOURS   RIGHT HEART CATH N/A 12/22/2020   Procedure: RIGHT HEART CATH;  Surgeon: Laurey Morale, MD;  Location: [facility] INVASIVE CV LAB;  Service: Cardiovascular;  Laterality: N/A;   RIGHT HEART CATH N/A 01/31/2021   Procedure: RIGHT HEART CATH;  Surgeon: Dolores Patty, MD;  Location: [facility] INVASIVE CV LAB;  Service: Cardiovascular;  Laterality: N/A;   RIGHT HEART CATH N/A 07/29/2021   Procedure: RIGHT HEART CATH;  Surgeon: Dolores Patty, MD;  Location: [facility] INVASIVE CV LAB;  Service: Cardiovascular;  Laterality: N/A;   RIGHT HEART CATH N/A 11/24/2021   Procedure: RIGHT HEART CATH;  Surgeon: Dolores Patty, MD;  Location: [facility] INVASIVE CV LAB;  Service: Cardiovascular;  Laterality: N/A;   RIGHT HEART CATH N/A 02/14/2022   Procedure: RIGHT HEART CATH;  Surgeon: Dolores Patty, MD;  Location: [facility] INVASIVE CV LAB;  Service: Cardiovascular;  Laterality: N/A;   RIGHT HEART CATH N/A 02/22/2022   Procedure: RIGHT HEART CATH;  Surgeon: Laurey Morale, MD;  Location: [facility] INVASIVE CV LAB;  Service: Cardiovascular;  Laterality: N/A;   TEE WITHOUT CARDIOVERSION N/A 11/26/2020   Procedure: TRANSESOPHAGEAL ECHOCARDIOGRAM (TEE);  Surgeon: Jodelle Red, MD;  Location: [facility] ENDOSCOPY;  Service: Cardiovascular;  Laterality: N/A;   TEE WITHOUT CARDIOVERSION N/A 12/29/2020   Procedure: TRANSESOPHAGEAL ECHOCARDIOGRAM (TEE);  Surgeon: Loreli Slot, MD;   Location: [facility] OR;  Service: Open Heart Surgery;  Laterality: N/A;   TEE WITHOUT CARDIOVERSION N/A 01/20/2022   Procedure: TRANSESOPHAGEAL ECHOCARDIOGRAM (TEE);  Surgeon: Dolores Patty, MD;  Location: [facility] ENDOSCOPY;  Service: Cardiovascular;  Laterality: N/A;   TRICUSPID VALVE  REPLACEMENT N/A 12/29/2020   Procedure: 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 RING SIZE 34;  Surgeon: Loreli Slot, MD;  Location: [facility] OR;  Service: Open Heart Surgery;  Laterality: N/A;    Allergies:  Allergies  Allergen Reactions   Bee Pollen Anaphylaxis and Other (See Comments)    UNCONFIRMED, but IS allergic to bee VENOM   Bee Venom Anaphylaxis   Sulfa Antibiotics Itching   Chlorhexidine    Iodinated Contrast Media Nausea And Vomiting    Medications:   Prior to Admission medications   Medication Sig Start Date End Date Taking? Authorizing Provider  acetaminophen (TYLENOL) 325 MG tablet Take 2 tablets (650 mg total) by mouth every 6 (six) hours as needed for mild pain or moderate pain. 12/07/21  Yes Arrien, York Ram, MD  albuterol (PROVENTIL HFA) 108 (90 Base) MCG/ACT inhaler Inhale 1-2 puffs into the lungs every 6 (six) hours as needed for wheezing or shortness of breath. Patient taking differently: Inhale 2 puffs into the lungs every 6 (six) hours as needed for wheezing or shortness of breath. 10/13/22  Yes Grayce Sessions, NP  amiodarone (PACERONE) 200 MG tablet TAKE 1 TABLET BY MOUTH EVERY DAY 05/21/23  Yes Bensimhon, Bevelyn Buckles, MD  busPIRone (BUSPAR) 5 MG tablet TAKE ONE TABLET BY MOUTH TWICE DAILY @ 9AM & 5PM 09/07/23  Yes Grayce Sessions, NP  diphenhydrAMINE (BENADRYL) 25 MG tablet Take 25 mg by mouth as needed. 03/31/22  Yes [provider]  DULoxetine (CYMBALTA) 60 MG capsule TAKE ONE CAPSULE (60MG  TOTAL) BY MOUTH DAILY AT 9AM 02/12/23  Yes Edwards, Michelle P, NP  ELIQUIS 5 MG TABS tablet TAKE ONE TABLET (5MG  TOTAL) BY MOUTH TWICE DAILY @9AM -5PM 09/06/23  Yes  Bensimhon, Bevelyn Buckles, MD  EPINEPHrine 0.3 mg/0.3 mL IJ SOAJ injection Inject 0.3 mg into the muscle as needed for anaphylaxis. 06/25/20  Yes Grayce Sessions, NP  fluticasone [facility] ALLERGY RELIEF) 50 MCG/ACT nasal spray Place 2 sprays into both nostrils as needed. 03/31/22  Yes [provider]  insulin glargine (LANTUS SOLOSTAR) 100 UNIT/ML Solostar Pen Inject 10 Units into the skin daily. 09/27/23  Yes Grayce Sessions, NP  insulin lispro (HUMALOG KWIKPEN) 100 UNIT/ML KwikPen For blood sugars 0-150 give 0 units of insulin, 151-200 give 2 units of insulin, 201-250 give 4 units, 251-300 give 6 units, 301-350 give 8 units, 351-400 give 10 units,> 400 give 12 units and call M.D. Patient taking differently: Inject 2-12 Units into the skin 2 (two) times daily as needed. For blood sugars 0-150 give 0 units of insulin, 151-200 give 2 units of insulin, 201-250 give 4 units, 251-300 give 6 units, 301-350 give 8 units, 351-400 give 10 units,> 400 give 12 units and call M.D. 09/27/23  Yes Hoy Register, MD  lidocaine-prilocaine (EMLA) cream Apply 1 Application topically Every Tuesday,Thursday,and Saturday with dialysis. 06/01/22  Yes [provider]  metoCLOPramide (REGLAN) 5 MG tablet Take 1 tablet (5 mg total) by mouth every 8 (eight) hours as needed for nausea. 07/10/23  Yes Grayce Sessions, NP  metoprolol tartrate (LOPRESSOR) 25 MG tablet Take 0.5 tablets (12.5 mg total) by mouth 2 (two) times daily. Patient taking differently: Take 12.5 mg by mouth daily. 03/30/23 12/19/23 Yes Noralee Stain, DO  midodrine (PROAMATINE) 10 MG tablet Take 1 tablet (10 mg total) by mouth See admin instructions. Take 10mg  (1 tablet) by mouth on Tuesday's, Thursday's, and Saturday's. 03/30/23  Yes Noralee Stain, DO  Multiple Vitamins-Minerals (MULTIVITAMIN WOMEN  PO) Take 1 tablet by mouth daily.   Yes [provider]  nitroGLYCERIN (NITROSTAT) 0.4 MG SL tablet Place 1 tablet (0.4 mg total) under the  tongue every 5 (five) minutes x 3 doses as needed for chest pain. 11/08/23  Yes Jodelle Red, MD  ondansetron (ZOFRAN) 8 MG tablet Take 8 mg by mouth 2 (two) times daily as needed. 06/10/23  Yes [provider]  pantoprazole (PROTONIX) 40 MG tablet Take 1 tablet (40 mg total) by mouth daily. 04/24/23  Yes Grayce Sessions, NP  polyethylene glycol powder (MIRALAX) 17 GM/SCOOP powder Take by mouth as needed. 06/26/22  Yes [provider]  rosuvastatin (CRESTOR) 10 MG tablet TAKE ONE TABLET BY MOUTH DAILY AT 5PM Patient taking differently: Take 10 mg by mouth daily. 04/13/23  Yes Bensimhon, Bevelyn Buckles, MD    Discontinued Meds:   Medications Discontinued During This Encounter  Medication Reason   glucose blood (ACCU-CHEK GUIDE) test strip    Blood Glucose Monitoring Suppl (ACCU-CHEK GUIDE) w/Device KIT    Doxercalciferol (HECTOROL IV)    colchicine 0.6 MG tablet Discontinued by provider   linaclotide (LINZESS) 72 MCG capsule Patient Preference   loratadine (CLARITIN) 10 MG tablet Patient Preference   meclizine (ANTIVERT) 25 MG tablet Patient Preference    Social History:  reports that she has never smoked. She has never been exposed to tobacco smoke. She has never used smokeless tobacco. She reports that she does not drink alcohol and does not use drugs.  Family History:   Family History  Problem Relation Age of Onset   Breast cancer Mother    Cancer Mother    Hypertension Mother    Cancer Father    Hypertension Sister    Hypertension Brother    CAD Other     Blood pressure 94/61, pulse 77, temperature 98 F (36.7 C), temperature source Oral, resp. rate 20, height 5\' 7"  (1.702 m), weight 126.9 kg, SpO2 99%. Physical Exam: General: alert , obese female, NAD Heart: Irregularly irregular, heart rate approximately 110s to 120s  Lungs: Rhonchi bilaterally, mildly increased respiratory rate  Abdomen: Obese, NABS, Soft ,NT, ND  Extremities: Trace  pedal edema   Dialysis Access: LUA AVF + bruit     Kosta Schnitzler, Len Blalock, MD 11/11/2023, 9:41 AM

## 2023-11-11 NOTE — Progress Notes (Signed)
   Rounding Note    Patient Name: [name] Date of Encounter: 11/11/2023  [facility] Cardiologist: Jodelle Red, MD   Subjective   NAEO. No new complaints  Vital Signs    Vitals:   11/10/23 2259 11/10/23 2300 11/10/23 2308 11/11/23 0512  BP:  (!) 101/57 (!) 101/57 94/61  Pulse: 95 (!) 108 86 77  Resp:  20 20 20   Temp:    98 F (36.7 C)  TempSrc:    Oral  SpO2:    99%  Weight:      Height:        Intake/Output Summary (Last 24 hours) at 11/11/2023 0800 Last data filed at 11/10/2023 2319 Gross per 24 hour  Intake 707.3 ml  Output --  Net 707.3 ml      11/10/2023    8:03 AM 09/21/2023    8:51 AM 07/23/2023    7:00 AM  Last 3 Weights  Weight (lbs) 279 lb 11.2 oz 271 lb 265 lb  Weight (kg) 126.871 kg 122.925 kg 120.203 kg      Telemetry    Intermittent AF with elevated ventricular rates. Burden has increased over last 12 hours - Personally Reviewed  ECG    Personally Reviewed  Physical Exam   GEN: No acute distress.   Cardiac: irregularly irregular, no murmurs, rubs, or gallops.  Respiratory: Clear to auscultation bilaterally. Psych: Normal affect   Assessment & Plan    #Atrial fibrillation, persistent -continue amiodarone drip for 24 hours.  Plan for transition to oral regimen tomorrow.   Continue Eliquis for stroke prophylaxis   #Upper respiratory infection Management per IM.  I suspect this is the primary driver of her worsening rhythm control.      Sheria Lang T. Lalla Brothers, MD, [facility] Cardiac Electrophysiology

## 2023-11-11 NOTE — Progress Notes (Signed)
  Progress Note   Patient: [name] NWG:[id number] DOB: Dec 26, 1955 DOA: 11/09/2023     2 DOS: the patient was seen and examined on 11/11/2023   Brief hospital course: 68 year old female with past medical history of ESRD [T/Th/Sat], atrial fibrillation, HTN, HLD, T2DM, COPD, DVT, OSA, asthma, NICM, sp TVR.  She presents with complaint of trouble breathing, coughing, chest pains since Wednesday.  Her shortness of breath has been getting progressively worse as has her chest pain.  She endorses palpitations, seizures and compliance with her medication.  She denies lightheadedness or dizziness.  Per patient yesterday told her that shortness of breath, the coughing, the chest pains.  She came to the ER.   In the ER presenting vitals 121/100, 123.  Troponin 9=>10, EKG A-fib with RVR heart rate 130, QTc 531.  Respiratory panel negative.  Patient given IV Cardizem without improvement.  Amiodarone drip initiated.  Cardiologist Dr. Regino Schultze consulted.  Assessment and Plan:  Atrial fibrillation with RVR (HCC) -Continued on amiodarone drip, eliquis, metoprolol -Cardiology following. Recs to continue amio gtt, possible change to PO 400mg  daily tomorrow. Can be titrated down in outpt setting -Rapid fib thought to be related to recent URI symptoms  URI -afebrile, no leukocytosis -Symptoms x <1 week -Cont supportive care. Not on abx currently -consider empiric abx if sx >1 week    Anxiety and depression -Cont BuSpar, Cymbalta    Chronic deep vein thrombosis (DVT) of other vein of right upper extremity  -cont eliquis as tolerated    COPD (chronic obstructive pulmonary disease) (HCC) -Flonase, inhaler as needed    Essential hypertension //  NICM //  sp TVR //  sp AVR -Metoprolol resumed -Eliquis resumed    HLD -Cont statin    T2DM //  gastroparesis -Continue Lantus 10 units subcu daily  -Continue Reglan -cont w/ SSI as needed    ESRD //  orthostatic hypotension -Nephrology  following -Cont with HD as scheduled    OSA -CPAP ordered    Gout -Colchicine resumed    QT prolongation      Subjective: Complains of continued congestion  Physical Exam: Vitals:   11/11/23 1345 11/11/23 1400 11/11/23 1430 11/11/23 1500  BP: (!) 87/56 (!) 138/100 101/60 (!) 106/49  Pulse:      Resp:      Temp:      TempSrc:      SpO2:      Weight:      Height:       General exam: Conversant, in no acute distress Respiratory system: normal chest rise, clear, no audible wheezing Cardiovascular system: regular rhythm, s1-s2 Gastrointestinal system: Nondistended, nontender, pos BS Central nervous system: No seizures, no tremors Extremities: No cyanosis, no joint deformities Skin: No rashes, no pallor Psychiatry: Affect normal // no auditory hallucinations   Data Reviewed:  Labs reviewed: Na 135, K 4.7, WBC 7.8, Hgb 9.0  Family Communication: Pt in room, family not at bedside  Disposition: Status is: Inpatient Remains inpatient appropriate because: severity of illness  Planned Discharge Destination: Home    Author: Rickey Barbara, MD 11/11/2023 3:31 PM  For on call review [email or web address].

## 2023-11-12 ENCOUNTER — Inpatient Hospital Stay (HOSPITAL_COMMUNITY)

## 2023-11-12 DIAGNOSIS — I4891 Unspecified atrial fibrillation: Secondary | ICD-10-CM | POA: Diagnosis not present

## 2023-11-12 LAB — GLUCOSE, CAPILLARY
Glucose-Capillary: 96 mg/dL (ref 70–99)
Glucose-Capillary: 135 mg/dL — ABNORMAL HIGH (ref 70–99)
Glucose-Capillary: 125 mg/dL — ABNORMAL HIGH (ref 70–99)
Glucose-Capillary: 155 mg/dL — ABNORMAL HIGH (ref 70–99)

## 2023-11-12 MED ORDER — AMOXICILLIN-POT CLAVULANATE 500-125 MG PO TABS
1.0000 | ORAL_TABLET | Freq: Two times a day (BID) | ORAL | Status: DC
  Administered 2023-11-12 – 2023-11-14 (×5): 1 via ORAL
  Filled 2023-11-12 (×5): qty 1

## 2023-11-12 MED ORDER — AMOXICILLIN-POT CLAVULANATE 875-125 MG PO TABS: 1.0000 | ORAL_TABLET | Freq: Two times a day (BID) | ORAL | Status: DC

## 2023-11-12 MED ORDER — HYDROCOD POLI-CHLORPHE POLI ER 10-8 MG/5ML PO SUER
5.0000 mL | Freq: Two times a day (BID) | ORAL | Status: DC | PRN
  Administered 2023-11-12 – 2023-11-14 (×5): 5 mL via ORAL
  Filled 2023-11-12 (×5): qty 5

## 2023-11-12 MED ORDER — MIDODRINE HCL 5 MG PO TABS: 10.0000 mg | ORAL_TABLET | ORAL | Status: DC

## 2023-11-12 MED ORDER — MIDODRINE HCL 5 MG PO TABS
10.0000 mg | ORAL_TABLET | ORAL | Status: DC
  Administered 2023-11-13 (×2): 10 mg via ORAL
  Filled 2023-11-12 (×2): qty 2

## 2023-11-12 MED ORDER — INSULIN GLARGINE-YFGN 100 UNIT/ML ~~LOC~~ SOLN
10.0000 [IU] | Freq: Every day | SUBCUTANEOUS | Status: DC
  Administered 2023-11-12 – 2023-11-13 (×2): 10 [IU] via SUBCUTANEOUS
  Filled 2023-11-12 (×3): qty 0.1

## 2023-11-12 NOTE — Progress Notes (Signed)
 Rounding Note    Patient Name: [name] Date of Encounter: 11/12/2023  [facility] Cardiologist: Jodelle Red, MD   Subjective   Coughing and sneezing all night Reports they were unable to pull any volume with her HD yesterday with next scheduled tomorrow  Inpatient Medications    Scheduled Meds:  apixaban  5 mg Oral BID   busPIRone  5 mg Oral BID   DULoxetine  60 mg Oral Daily   heparin sodium (porcine)  3,200 Units Intracatheter Once   insulin aspart  0-15 Units Subcutaneous TID WC   insulin aspart  0-5 Units Subcutaneous QHS   insulin glargine  10 Units Subcutaneous QHS   metoprolol tartrate  12.5 mg Oral BID   oxyCODONE  5 mg Oral Once   pantoprazole  40 mg Oral Daily   rosuvastatin  10 mg Oral Daily   Continuous Infusions:  amiodarone 30 mg/hr (11/11/23 2317)   PRN Meds: acetaminophen **OR** acetaminophen, fentaNYL (SUBLIMAZE) injection, fluticasone, guaiFENesin, metoCLOPramide, naLOXone (NARCAN)  injection, prochlorperazine, senna-docusate   Vital Signs    Vitals:   11/11/23 2035 11/11/23 2205 11/11/23 2317 11/12/23 0625  BP: 94/63 92/63  95/65  Pulse:  91 100   Resp: 20   19  Temp: 97.8 F (36.6 C)   98.3 F (36.8 C)  TempSrc: Oral   Oral  SpO2:      Weight:      Height:        Intake/Output Summary (Last 24 hours) at 11/12/2023 0818 Last data filed at 11/11/2023 1712 Gross per 24 hour  Intake 14.92 ml  Output 0 ml  Net 14.92 ml      11/11/2023    1:43 PM 11/10/2023    8:03 AM 09/21/2023    8:51 AM  Last 3 Weights  Weight (lbs) 281 lb 4.9 oz 279 lb 11.2 oz 271 lb  Weight (kg) 127.6 kg 126.871 kg 122.925 kg      Telemetry    AFib 90's-100's - Personally Reviewed  ECG    No new EKGs - Personally Reviewed  Physical Exam   GEN: No acute distress.   Neck: No JVD Cardiac: RRR, no murmurs, rubs, or gallops.  Respiratory: she has crackles b/l bases, some exp wheezes, moving air OK< 94% on RA GI: Soft,  nontender, non-distended  MS: No edema; No deformity. Neuro:  Nonfocal  Psych: Normal affect   Labs    High Sensitivity Troponin:   Recent Labs  Lab 11/09/23 1634 11/09/23 1820  TROPONINIHS 9 10     Chemistry Recent Labs  Lab 11/09/23 1634 11/09/23 1820 11/10/23 0620 11/11/23 0400  NA 139  --  136 135  K 4.2  --  4.2 4.7  CL 93*  --  94* 94*  CO2 28  --  27 24  GLUCOSE 115*  --  179* 137*  BUN 22  --  29* 38*  CREATININE 6.54*  --  7.68* 9.83*  CALCIUM 10.1  --  9.8 9.8  MG  --  2.0  --  2.1  PROT  --   --   --  6.9  ALBUMIN  --   --   --  3.2*  AST  --   --   --  15  ALT  --   --   --  9  ALKPHOS  --   --   --  64  BILITOT  --   --   --  0.8  GFRNONAA 6*  --  5* 4*  ANIONGAP 18*  --  15 17*    Lipids No results for input(s): "CHOL", "TRIG", "HDL", "LABVLDL", "LDLCALC", "CHOLHDL" in the last 168 hours.  Hematology Recent Labs  Lab 11/09/23 1634 11/10/23 0620 11/11/23 0400  WBC 8.6 8.8 7.8  RBC 2.94* 2.80* 2.81*  HGB 9.4* 8.9* 9.0*  HCT 29.7* 28.9* 28.0*  MCV 101.0* 103.2* 99.6  MCH 32.0 31.8 32.0  MCHC 31.6 30.8 32.1  RDW 15.5 15.9* 15.8*  PLT 164 150 148*   Thyroid No results for input(s): "TSH", "FREET4" in the last 168 hours.  BNP Recent Labs  Lab 11/09/23 1634  BNP 278.7*    DDimer No results for input(s): "DDIMER" in the last 168 hours.   Radiology    No results found.  Cardiac Studies   10/15/23: TTE 1. 3D EF 69%. Left ventricular ejection fraction, by estimation, is 55 to  60%. The left ventricle has normal function. The left ventricle has no  regional wall motion abnormalities. Left ventricular diastolic parameters  are indeterminate.   2. Right ventricular systolic function is mildly reduced. The right  ventricular size is mildly enlarged.   3. Left atrial size was severely dilated.   4. Right atrial size was severely dilated.   5. Post repair with ring Mild residual MR Mean diastolic gradient 8 mmHg  at HR 68 bpm. The mitral  valve has been repaired/replaced. No evidence of  mitral valve regurgitation. No evidence of mitral stenosis.   6. Post repair with ring. The tricuspid valve is has been  repaired/replaced. Tricuspid valve regurgitation is moderate.   7. The aortic valve is tricuspid. Aortic valve regurgitation is not  visualized. No aortic stenosis is present.   8. The inferior vena cava is normal in size with greater than 50%  respiratory variability, suggesting right atrial pressure of 3 mmHg.    Patient Profile     68 y.o. female w/PMHx of DM, HTN, HLD chronic CHF (mixed systolic/diastolic), DVT, COPD/asthma ESRF on HD Long hx of AFib VHD (MV and TV repairs/MAZE/LAA clipping w/AtriClip, 45 mm 12/30/2020)  Tikosyn failed to maintain SR (2022) Amiodarone started 2022   ADMITTED this admission with URI symptoms, pleuritic sound CP EP consulted for afib w/RVR  Assessment & Plan    Persistent AFib CHA2DS2Vasc is 7, on Eliquis Amiodarone >> IV gtt here Will revisit PO timing with MD today, she continues to feel poorly cough/congestion, sneezing Rates quickly > 110's or so by moving in bed   Volume with HD Reports BPs here are her baseline (90's/60's)  For questions or updates, please contact [facility] Please consult [email or web address] for contact info under        Signed, Sheilah Pigeon, PA-C  11/12/2023, 8:18 AM

## 2023-11-12 NOTE — Evaluation (Signed)
 Occupational Therapy Evaluation Patient Details Name: [name] MRN: [id number] DOB: 08-03-1956 Today's Date: 11/12/2023   History of Present Illness   Patient is a 68 yo female presenting to the ED with chest pain, SOB and coughing on 11/09/23.Admitted with Afib with RVR.  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     Clinical Impressions Prior to this admission, patient living with her children would occasionally use a rollator or a cane depending on fatigue level, and required assist for lower body dressing. Patient reports 2 falls in the last six months, and needs assist to get in and out of the shower. Patient has been educated on adaptive equipment in the past, but currently does not own any. Patient recieved sitting at sink completing bathing task, and reporting 9/10 chest pain (RN notified in session). Patient CGA for ADLs and functional mobility, limited in distance by chest pain and fatigue. HR was noted no higher than 90 during session. OT will continue to follow acutely, with recommendation for a tub bench for home. OT recommending HHPT to work on balance and activity tolerance if possible.      If plan is discharge home, recommend the following:   A little help with walking and/or transfers;A little help with bathing/dressing/bathroom;Assistance with cooking/housework     Functional Status Assessment   Patient has had a recent decline in their functional status and demonstrates the ability to make significant improvements in function in a reasonable and predictable amount of time.     Equipment Recommendations   Tub/shower bench     Recommendations for Other Services         Precautions/Restrictions   Precautions Precautions: Fall Recall of Precautions/Restrictions: Intact Precaution/Restrictions Comments: watch HR Restrictions Weight Bearing Restrictions Per Provider  Order: No     Mobility Bed Mobility Overal bed mobility: Needs Assistance Bed Mobility: Sit to Supine       Sit to supine: Contact guard assist   General bed mobility comments: CGA for safety    Transfers Overall transfer level: Needs assistance   Transfers: Sit to/from Stand Sit to Stand: Contact guard assist           General transfer comment: CGA for safety      Balance Overall balance assessment: Needs assistance Sitting-balance support: No upper extremity supported, Feet supported Sitting balance-Leahy Scale: Good     Standing balance support: No upper extremity supported, During functional activity Standing balance-Leahy Scale: Fair Standing balance comment: did not challenge when ambulating                           ADL either performed or assessed with clinical judgement   ADL Overall ADL's : Needs assistance/impaired Eating/Feeding: Set up;Sitting   Grooming: Set up;Sitting   Upper Body Bathing: Set up;Sitting   Lower Body Bathing: Set up;Sitting/lateral leans;Sit to/from stand   Upper Body Dressing : Contact guard assist;Sitting Upper Body Dressing Details (indicate cue type and reason): donning new gown Lower Body Dressing: Total assistance;Sitting/lateral leans Lower Body Dressing Details (indicate cue type and reason): unable to reach feet to doff and don socks to date Toilet Transfer: Contact guard assist;Ambulation Statistician Details (indicate cue type and reason): close CGA provided Toileting- Clothing Manipulation and Hygiene: Contact guard assist;Sit to/from stand;Sitting/lateral lean       Functional mobility during ADLs: Contact guard assist;Cueing for safety;Cueing for sequencing  General ADL Comments: Prior to this admission, patient living with her children would occasionally use a rollator or a cane depending on fatigue level, and required assist for lower body dressing. Patient reports 2 falls in the last six months,  and needs assist to get in and out of the shower. Patient has been educated on adaptive equipment in the past, but currently does not own any. Patient recieved sitting at sink completing bathing task, and reporting 9/10 chest pain (RN notified in session). Patient CGA for ADLs and functional mobility, limited in distance by chest pain and fatigue. HR was noted no higher than 90 during session. OT will continue to follow acutely, with recommendation for a tub bench for home. OT recommending HHPT to work on balance and activity tolerance if possible.     Vision Baseline Vision/History: 1 Wears glasses Ability to See in Adequate Light: 0 Adequate Patient Visual Report: No change from baseline Vision Assessment?: No apparent visual deficits     Perception Perception: Not tested       Praxis Praxis: Not tested       Pertinent Vitals/Pain Pain Assessment Pain Assessment: 0-10 Pain Score: 9  Pain Location: chest pain Pain Descriptors / Indicators: Cramping, Discomfort, Grimacing Pain Intervention(s): Limited activity within patient's tolerance, Monitored during session, Repositioned, Other (comment) (RN notified)     Extremity/Trunk Assessment Upper Extremity Assessment Upper Extremity Assessment: Generalized weakness   Lower Extremity Assessment Lower Extremity Assessment: Defer to PT evaluation   Cervical / Trunk Assessment Cervical / Trunk Assessment: Other exceptions (increased body habitus)   Communication Communication Communication: No apparent difficulties   Cognition Arousal: Alert Behavior During Therapy: WFL for tasks assessed/performed Cognition: No apparent impairments                               Following commands: Intact       Cueing  General Comments   Cueing Techniques: Verbal cues  VSS on RA, HR no higher than 90 BPM   Exercises     Shoulder Instructions      Home Living Family/patient expects to be discharged to:: Private  residence Living Arrangements: Children Available Help at Discharge: Family;Available 24 hours/day Type of Home: Apartment Home Access: Level entry     Home Layout: One level     Bathroom Shower/Tub: Chief Strategy Officer: Standard     Home Equipment: Cane - single point;Rollator (4 wheels)          Prior Functioning/Environment Prior Level of Function : Needs assist;History of Falls (last six months)             Mobility Comments: walks with a cane, reports 2 falls in the last 6 months ADLs Comments: occaisionally needs help with lower body dressing, and to get in and out of  the shower    OT Problem List: Decreased strength;Decreased range of motion;Decreased activity tolerance;Impaired balance (sitting and/or standing);Decreased coordination;Decreased knowledge of use of DME or AE;Cardiopulmonary status limiting activity;Obesity;Pain   OT Treatment/Interventions: Self-care/ADL training;Therapeutic exercise;Energy conservation;DME and/or AE instruction;Manual therapy;Therapeutic activities;Patient/family education;Balance training      OT Goals(Current goals can be found in the care plan section)   Acute Rehab OT Goals Patient Stated Goal: to be in less pain OT Goal Formulation: With patient Time For Goal Achievement: 11/26/23 Potential to Achieve Goals: Good ADL Goals Pt Will Perform Lower Body Bathing: with supervision;sitting/lateral leans;sit to/from stand;with adaptive equipment Pt Will  Perform Lower Body Dressing: with supervision;sitting/lateral leans;sit to/from stand;with adaptive equipment Pt Will Transfer to Toilet: with supervision;ambulating;regular height toilet;grab bars Pt Will Perform Toileting - Clothing Manipulation and hygiene: with supervision;sit to/from stand;sitting/lateral leans Pt Will Perform Tub/Shower Transfer: with supervision;tub bench;ambulating;rolling walker Additional ADL Goal #1: Patient will be able to complete  functional task in standing for 3-5 minutes without seated rest break in order to increase activity tolerance.   OT Frequency:  Min 2X/week    Co-evaluation              AM-PAC OT "6 Clicks" Daily Activity     Outcome Measure Help from another person eating meals?: A Little Help from another person taking care of personal grooming?: A Little Help from another person toileting, which includes using toliet, bedpan, or urinal?: A Little Help from another person bathing (including washing, rinsing, drying)?: A Lot Help from another person to put on and taking off regular upper body clothing?: A Little Help from another person to put on and taking off regular lower body clothing?: Total 6 Click Score: 15   End of Session Nurse Communication: Mobility status;Other (comment) (chest pain)  Activity Tolerance: Patient tolerated treatment well Patient left: in bed;with call bell/phone within reach  OT Visit Diagnosis: Unsteadiness on feet (R26.81);Other abnormalities of gait and mobility (R26.89);Repeated falls (R29.6);Muscle weakness (generalized) (M62.81);History of falling (Z91.81);Pain                Time: 1045-1110 OT Time Calculation (min): 25 min Charges:  OT General Charges $OT Visit: 1 Visit OT Evaluation $OT Eval Moderate Complexity: 1 Mod OT Treatments $Self Care/Home Management : 8-22 mins  Pollyann Glen E. Kasen Adduci, OTR/L Acute Rehabilitation Services [phone]   Cherlyn Cushing 11/12/2023, 11:30 AM

## 2023-11-12 NOTE — Progress Notes (Signed)
 [facility] Progress Note   Subjective:    Seen and examined patient at bedside. She reports feeling unwell. Endorses wheezing and congestion. Noted she was ran even 2nd low blood pressures. Currently on RA and HR appears stable. Next HD 3/25.  Objective Vitals:   11/11/23 2035 11/11/23 2205 11/11/23 2317 11/12/23 0625  BP: 94/63 92/63  95/65  Pulse:  91 100   Resp: 20   19  Temp: 97.8 F (36.6 C)   98.3 F (36.8 C)  TempSrc: Oral   Oral  SpO2:      Weight:      Height:       Physical Exam General: Awake, alert, on RA, Obese, NAD Heart: S1 and S2; No murmurs, gallops, or rubs Lungs: Wheezing/rhonchi bilaterally; No rales Abdomen: Large, soft, non-tender Extremities: Trace pedal edema Dialysis Access: L AVF (+) B/T   Filed Weights   11/10/23 0803 11/11/23 1343  Weight: 126.9 kg 127.6 kg    Intake/Output Summary (Last 24 hours) at 11/12/2023 0850 Last data filed at 11/11/2023 1712 Gross per 24 hour  Intake 14.92 ml  Output 0 ml  Net 14.92 ml    Additional Objective Labs: Basic Metabolic Panel: Recent Labs  Lab 11/09/23 1634 11/10/23 0620 11/11/23 0400  NA 139 136 135  K 4.2 4.2 4.7  CL 93* 94* 94*  CO2 28 27 24   GLUCOSE 115* 179* 137*  BUN 22 29* 38*  CREATININE 6.54* 7.68* 9.83*  CALCIUM 10.1 9.8 9.8   Liver Function Tests: Recent Labs  Lab 11/11/23 0400  AST 15  ALT 9  ALKPHOS 64  BILITOT 0.8  PROT 6.9  ALBUMIN 3.2*   No results for input(s): "LIPASE", "AMYLASE" in the last 168 hours. CBC: Recent Labs  Lab 11/09/23 1634 11/10/23 0620 11/11/23 0400  WBC 8.6 8.8 7.8  NEUTROABS  --  5.6  --   HGB 9.4* 8.9* 9.0*  HCT 29.7* 28.9* 28.0*  MCV 101.0* 103.2* 99.6  PLT 164 150 148*   Blood Culture    Component Value Date/Time   SDES  09/14/2022 0840    BLOOD RIGHT HAND Performed at [facility], [address], [place], [place] [address]    [facility]  09/14/2022 0840    BOTTLES DRAWN AEROBIC AND  ANAEROBIC Blood Culture adequate volume Performed at [facility], [address] [place], [place] [address]    CULT  09/14/2022 0840    NO GROWTH 5 DAYS Performed at [facility], [address] [place], [place] [address]    REPTSTATUS 09/19/2022 FINAL 09/14/2022 0840    Cardiac Enzymes: No results for input(s): "CKTOTAL", "CKMB", "CKMBINDEX", "TROPONINI" in the last 168 hours. CBG: Recent Labs  Lab 11/11/23 0719 11/11/23 1109 11/11/23 1704 11/11/23 2118 11/12/23 0718  GLUCAP 107* 168* 109* 134* 96   Iron Studies: No results for input(s): "IRON", "TIBC", "TRANSFERRIN", "FERRITIN" in the last 72 hours. Lab Results  Component Value Date   INR 1.9 (H) 06/18/2023   INR 2.0 (H) 06/28/2022   INR 2.3 (H) 11/24/2021   Studies/Results: No results found.  Medications:  amiodarone 30 mg/hr (11/11/23 2317)    apixaban  5 mg Oral BID   busPIRone  5 mg Oral BID   DULoxetine  60 mg Oral Daily   heparin sodium (porcine)  3,200 Units Intracatheter Once   insulin aspart  0-15 Units Subcutaneous TID WC   insulin aspart  0-5 Units Subcutaneous QHS   insulin glargine  10 Units Subcutaneous QHS   metoprolol tartrate  12.5 mg Oral BID   oxyCODONE  5 mg Oral Once   pantoprazole  40 mg Oral Daily   rosuvastatin  10 mg Oral Daily    Dialysis Orders: SW TTS  4h  400/600   124.4 kg  3K/2.5Ca bath AVF   Heparin 2500 - last OP HD 3/20, usually getting to or close to dry wt - hectorol 3 mcg  - venofer 50 mg qweekly - mircera 150 mcg IV q 2, last on 3/19   Assessment/Plan:  URI per primary. ESRD - Received HD yesterday (ran even 2nd low Bps). Next HD 11/13/23. Chronic combined systolic and diastolic CHF  with peripheral patient seen by cardiology. Hypertension/volume  -  Not severely overloaded but her Bps drop during HD. On Midodrine 10mg  with HD in outpatient. Plan to d/w EP and Cardiology to ensure this is okay to restart. A Fib at home on eliquis seen by  cardiology. Patient has been cardioverted in the past last time July 23, 2023, on DOAC, prior MAZE.  The on amiodarone drip, RVR thought to be triggered by URI per cardiology. Anemia  - Hgb 9.4 (not due for ESA), dose on 3/19 Metabolic bone disease -   check a phos for binder management.  In past Vel phoro binder but gi distress , not on binder for now will fu  phos trend as op.  8. Nutrition - renal diet, carb modified. 9  DM type 2 - stable.  Plan per primary  Salome Holmes, NP [facility] 11/12/2023,8:50 AM  LOS: 3 days

## 2023-11-12 NOTE — Progress Notes (Signed)
  Progress Note   Patient: [name] ZOX:[id number] DOB: 01-May-1956 DOA: 11/09/2023     3 DOS: the patient was seen and examined on 11/12/2023   Brief hospital course: 68 year old female with past medical history of ESRD [T/Th/Sat], atrial fibrillation, HTN, HLD, T2DM, COPD, DVT, OSA, asthma, NICM, sp TVR.  She presents with complaint of trouble breathing, coughing, chest pains since Wednesday.  Her shortness of breath has been getting progressively worse as has her chest pain.  She endorses palpitations, seizures and compliance with her medication.  She denies lightheadedness or dizziness.  Per patient yesterday told her that shortness of breath, the coughing, the chest pains.  She came to the ER.   In the ER presenting vitals 121/100, 123.  Troponin 9=>10, EKG A-fib with RVR heart rate 130, QTc 531.  Respiratory panel negative.  Patient given IV Cardizem without improvement.  Amiodarone drip initiated.  Cardiologist Dr. Regino Schultze consulted.  Assessment and Plan:  Atrial fibrillation with RVR (HCC) -Continued on amiodarone drip, eliquis, metoprolol -Cardiology following. Recs to continue amio gtt. Ultimately plan to transition to PO amio per Cardiology -Rapid fib thought to be related to recent URI symptoms, per below  URI -afebrile, no leukocytosis -Symptoms now x 5 days, not much improvement in symptoms -Cont supportive care.  -Repeat CXR reviewed, cardiomegaly, otherwise neg -tenderness over maxillary sinuses on exam -Will start renally dosed augmentin    Anxiety and depression -Cont BuSpar, Cymbalta    Chronic deep vein thrombosis (DVT) of other vein of right upper extremity  -cont eliquis as tolerated    COPD (chronic obstructive pulmonary disease) (HCC) -Flonase, inhaler as needed    Essential hypertension //  NICM //  sp TVR //  sp AVR -Metoprolol resumed -Eliquis continued    HLD -Cont statin    T2DM //  gastroparesis -Continue Lantus 10 units subcu daily   -Continue Reglan -cont w/ SSI as needed    ESRD //  orthostatic hypotension -Nephrology following -Cont with HD as scheduled    OSA -CPAP ordered    Gout -Colchicine resumed    QT prolongation      Subjective: complains of continued congestion, cough. Sinuses tender  Physical Exam: Vitals:   11/11/23 2317 11/12/23 0625 11/12/23 0852 11/12/23 1124  BP:  95/65 (!) 105/59 (!) 92/58  Pulse: 100  99 64  Resp:  19  18  Temp:  98.3 F (36.8 C)  97.6 F (36.4 C)  TempSrc:  Oral  Oral  SpO2:    96%  Weight:      Height:       General exam: Awake, laying in bed, in nad, tenderness on palpation over maxillary sinues B Respiratory system: Normal respiratory effort, no wheezing Cardiovascular system: regular rate, s1, s2 Gastrointestinal system: Soft, nondistended, positive BS Central nervous system: CN2-12 grossly intact, strength intact Extremities: Perfused, no clubbing Skin: Normal skin turgor, no notable skin lesions seen Psychiatry: Mood normal // no visual hallucinations   Data Reviewed:  Labs reviewed: Na 135, K 4.7, WBC 7.8, Hgb 9.0  Family Communication: Pt in room, family not at bedside  Disposition: Status is: Inpatient Remains inpatient appropriate because: severity of illness  Planned Discharge Destination: Home    Author: Rickey Barbara, MD 11/12/2023 2:49 PM  For on call review [email or web address].

## 2023-11-12 NOTE — TOC Initial Note (Signed)
 Transition of Care [facility]) - Initial/Assessment Note    Patient Details  Name: [name] MRN: [id number] Date of Birth: 01-14-1956  [facility] CM/SW Contact:    Gala Lewandowsky, RN Phone Number: 11/12/2023, 4:35 PM  Clinical Narrative: Risk for readmission assessment completed. Patient presented for Atrial Fib. PTA patient states she was from home with her daughter. Patient states she has DME: cane, rollator, and bedside commode. PT/OT to evaluate for disposition needs. Patient reports that a friend takes her to HD and to other appointments. Case Manager will continue to follow for additional needs as the patient progresses.                  Expected Discharge Plan: Home w Home Health Services Barriers to Discharge: Continued Medical Work up  Patient Goals and CMS Choice Patient states their goals for this hospitalization and ongoing recovery are:: patient plans to return home with daughter.   Expected Discharge Plan and Services In-house Referral: NA Discharge Planning Services: CM Consult Post Acute Care Choice: Home Health Living arrangements for the past 2 months: Apartment  Prior Living Arrangements/Services Living arrangements for the past 2 months: Apartment Lives with:: Adult Children Patient language and need for interpreter reviewed:: Yes Do you feel safe going back to the place where you live?: Yes      Need for Family Participation in Patient Care: Yes (Comment) Care giver support system in place?: Yes (comment) Current home services: DME (cane, rollator, bedside commode.) Criminal Activity/Legal Involvement Pertinent to Current Situation/Hospitalization: No - Comment as needed  Activities of Daily Living   ADL Screening (condition at time of admission) Independently performs ADLs?: Yes (appropriate for developmental age) Is the patient deaf or have difficulty hearing?: No Does the patient have difficulty seeing, even when  wearing glasses/contacts?: No Does the patient have difficulty concentrating, remembering, or making decisions?: No  Permission Sought/Granted Permission sought to share information with : Family Supports, Case Manager    Emotional Assessment Appearance:: Appears stated age Attitude/Demeanor/Rapport: Engaged Affect (typically observed): Appropriate Orientation: : Oriented to Self, Oriented to Place, Oriented to  Time, Oriented to Situation Alcohol / Substance Use: Not Applicable Psych Involvement: No (comment)  Admission diagnosis:  Atrial fibrillation with rapid ventricular response (HCC) [I48.91] Atrial fibrillation with RVR (HCC) [I48.91] Patient Active Problem List   Diagnosis Date Noted   Atrial fibrillation with RVR (HCC) 11/09/2023   HLD (hyperlipidemia) 11/09/2023   Persistent atrial fibrillation (HCC) 07/16/2023   Encounter for monitoring amiodarone therapy 07/16/2023   Dyspnea 07/03/2023   Vertigo 03/17/2023   Orthostatic hypotension 03/14/2023   Left sided numbness 03/14/2023   Screening for malignant neoplasm of colon 02/21/2023   Nausea and vomiting 02/21/2023   HCAP (healthcare-associated pneumonia) 06/21/2022   ESRD on dialysis (HCC) 06/21/2022   OSA on CPAP 06/21/2022   Class 3 obesity 03/18/2022   Chronic deep vein thrombosis (DVT) of other vein of right upper extremity (HCC) 12/29/2021   Abnormal weight loss 11/16/2021   History of colonic polyps 11/16/2021   Rib pain 11/16/2021   Anxiety and depression 08/26/2021   Essential hypertension 08/26/2021   GERD (gastroesophageal reflux disease) 08/26/2021   COPD (chronic obstructive pulmonary disease) (HCC) 07/29/2021   QT prolongation 07/29/2021   Chronic diastolic CHF (congestive heart failure) (HCC)    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   Teeth missing    Gingivitis    Accretions on teeth  Atrial fibrillation, permanent (HCC) 11/02/2020   NICM (nonischemic  cardiomyopathy) (HCC) 04/01/2020   Family history of heart disease 04/01/2020   Mitral regurgitation 04/01/2020   Chest pain 02/26/2012   Type 2 diabetes mellitus with hyperlipidemia (HCC) 02/04/2010   Gastroparesis 02/04/2010   PCP:  Grayce Sessions, NP Pharmacy:   [id number] [address], [place] - [address] [place] [address] Phone: [phone] Fax: [phone]  [facility] - [place], [place] - [address] [place] [address] Phone: [phone] Fax: [phone]  Social Drivers of Health (SDOH) Social History: SDOH Screenings   Food Insecurity: No Food Insecurity (11/10/2023)  Housing: Low Risk  (11/10/2023)  Transportation Needs: No Transportation Needs (11/10/2023)  Utilities: Not At Risk (11/10/2023)  Alcohol Screen: Low Risk  (06/04/2023)  Depression (PHQ2-9): High Risk (06/04/2023)  Financial Resource Strain: Low Risk  (06/04/2023)  Physical Activity: Insufficiently Active (06/04/2023)  Social Connections: Moderately Isolated (11/10/2023)  Stress: No Stress Concern Present (06/04/2023)  Tobacco Use: Low Risk  (11/09/2023)  Health Literacy: Patient Declined (06/04/2023)   SDOH Interventions:     Readmission Risk Interventions    11/12/2023    4:32 PM 03/16/2023    3:57 PM 01/20/2022    5:06 PM  Readmission Risk Prevention Plan  Transportation Screening Complete Complete Complete  HRI or Home Care Consult Complete    Social Work Consult for Recovery Care Planning/Counseling Complete    Palliative Care Screening Not Applicable    Medication Review Oceanographer) Referral to Pharmacy Referral to Pharmacy   PCP or Specialist appointment within 3-5 days of discharge  Complete Complete  HRI or Home Care Consult  Complete   SW Recovery Care/Counseling Consult  Complete Complete  Palliative Care Screening  Not Applicable Not Applicable  Skilled Nursing Facility  Not  Applicable Not Applicable

## 2023-11-13 DIAGNOSIS — I4891 Unspecified atrial fibrillation: Secondary | ICD-10-CM | POA: Diagnosis not present

## 2023-11-13 LAB — CBC WITH DIFFERENTIAL/PLATELET
Abs Immature Granulocytes: 0.05 10*3/uL (ref 0.00–0.07)
Basophils Absolute: 0 10*3/uL (ref 0.0–0.1)
Basophils Relative: 1 %
Eosinophils Absolute: 0.2 10*3/uL (ref 0.0–0.5)
Eosinophils Relative: 3 %
HCT: 25.5 % — ABNORMAL LOW (ref 36.0–46.0)
Hemoglobin: 8.1 g/dL — ABNORMAL LOW (ref 12.0–15.0)
Immature Granulocytes: 1 %
Lymphocytes Relative: 18 %
Lymphs Abs: 1.3 10*3/uL (ref 0.7–4.0)
MCH: 32.1 pg (ref 26.0–34.0)
MCHC: 31.8 g/dL (ref 30.0–36.0)
MCV: 101.2 fL — ABNORMAL HIGH (ref 80.0–100.0)
Monocytes Absolute: 1.5 10*3/uL — ABNORMAL HIGH (ref 0.1–1.0)
Monocytes Relative: 20 %
Neutro Abs: 4.4 10*3/uL (ref 1.7–7.7)
Neutrophils Relative %: 57 %
Platelets: 157 10*3/uL (ref 150–400)
RBC: 2.52 MIL/uL — ABNORMAL LOW (ref 3.87–5.11)
RDW: 16.3 % — ABNORMAL HIGH (ref 11.5–15.5)
WBC: 7.5 10*3/uL (ref 4.0–10.5)
nRBC: 0 % (ref 0.0–0.2)

## 2023-11-13 LAB — RENAL FUNCTION PANEL
Albumin: 3.3 g/dL — ABNORMAL LOW (ref 3.5–5.0)
Anion gap: 18 — ABNORMAL HIGH (ref 5–15)
BUN: 38 mg/dL — ABNORMAL HIGH (ref 8–23)
CO2: 22 mmol/L (ref 22–32)
Calcium: 9.4 mg/dL (ref 8.9–10.3)
Chloride: 94 mmol/L — ABNORMAL LOW (ref 98–111)
Creatinine, Ser: 8.96 mg/dL — ABNORMAL HIGH (ref 0.44–1.00)
GFR, Estimated: 4 mL/min — ABNORMAL LOW (ref >60)
Glucose, Bld: 99 mg/dL (ref 70–99)
Phosphorus: 5.4 mg/dL — ABNORMAL HIGH (ref 2.5–4.6)
Potassium: 4.1 mmol/L (ref 3.5–5.1)
Sodium: 134 mmol/L — ABNORMAL LOW (ref 135–145)

## 2023-11-13 LAB — GLUCOSE, CAPILLARY
Glucose-Capillary: 104 mg/dL — ABNORMAL HIGH (ref 70–99)
Glucose-Capillary: 72 mg/dL (ref 70–99)
Glucose-Capillary: 147 mg/dL — ABNORMAL HIGH (ref 70–99)
Glucose-Capillary: 109 mg/dL — ABNORMAL HIGH (ref 70–99)

## 2023-11-13 LAB — HEPATITIS B SURFACE ANTIBODY, QUANTITATIVE: Hep B S AB Quant (Post): 132 m[IU]/mL

## 2023-11-13 MED ORDER — HEPARIN SODIUM (PORCINE) 1000 UNIT/ML IJ SOLN
INTRAMUSCULAR | Status: AC
Start: 2023-11-13 — End: 2023-11-13
  Administered 2023-11-13: 2000 [IU] via INTRAVENOUS_CENTRAL
  Filled 2023-11-13: qty 2

## 2023-11-13 MED ORDER — ALBUMIN HUMAN 25 % IV SOLN
25.0000 g | Freq: Two times a day (BID) | INTRAVENOUS | Status: DC | PRN
  Administered 2023-11-13 (×2): 25 g via INTRAVENOUS
  Filled 2023-11-13: qty 100

## 2023-11-13 MED ORDER — HEPARIN SODIUM (PORCINE) 1000 UNIT/ML DIALYSIS: 2000.0000 [IU] | Freq: Once | INTRAMUSCULAR | Status: AC

## 2023-11-13 MED ORDER — AMIODARONE HCL IN DEXTROSE 360-4.14 MG/200ML-% IV SOLN
INTRAVENOUS | Status: AC
  Filled 2023-11-13: qty 200

## 2023-11-13 MED ORDER — AMIODARONE HCL 200 MG PO TABS
400.0000 mg | ORAL_TABLET | Freq: Every day | ORAL | Status: DC
  Administered 2023-11-13 – 2023-11-14 (×2): 400 mg via ORAL
  Filled 2023-11-13 (×2): qty 2

## 2023-11-13 NOTE — Procedures (Signed)
 I was present at this dialysis session. I have reviewed the session itself and made appropriate changes.   Vital signs in last 24 hours:  Temp:  [97.6 F (36.4 C)-98.7 F (37.1 C)] 97.9 F (36.6 C) (03/25 0750) Pulse Rate:  [64-99] 72 (03/25 0821) Resp:  [16-20] 17 (03/25 0821) BP: (87-108)/(52-66) 92/63 (03/25 0821) SpO2:  [95 %-99 %] 95 % (03/25 0821) Weight change:  Filed Weights   11/10/23 0803 11/11/23 1343  Weight: 126.9 kg 127.6 kg    Recent Labs  Lab 11/13/23 0421  NA 134*  K 4.1  CL 94*  CO2 22  GLUCOSE 99  BUN 38*  CREATININE 8.96*  CALCIUM 9.4  PHOS 5.4*    Recent Labs  Lab 11/10/23 0620 11/11/23 0400 11/13/23 0421  WBC 8.8 7.8 7.5  NEUTROABS 5.6  --  4.4  HGB 8.9* 9.0* 8.1*  HCT 28.9* 28.0* 25.5*  MCV 103.2* 99.6 101.2*  PLT 150 148* 157    Scheduled Meds:  amiodarone  400 mg Oral Daily   amoxicillin-clavulanate  1 tablet Oral BID   apixaban  5 mg Oral BID   busPIRone  5 mg Oral BID   DULoxetine  60 mg Oral Daily   heparin sodium (porcine)  3,200 Units Intracatheter Once   insulin aspart  0-15 Units Subcutaneous TID WC   insulin aspart  0-5 Units Subcutaneous QHS   insulin glargine-yfgn  10 Units Subcutaneous QHS   metoprolol tartrate  12.5 mg Oral BID   midodrine  10 mg Oral Q T,Th,Sa-HD   oxyCODONE  5 mg Oral Once   pantoprazole  40 mg Oral Daily   rosuvastatin  10 mg Oral Daily   Continuous Infusions:  albumin human 25 g (11/13/23 0757)   PRN Meds:.acetaminophen **OR** acetaminophen, albumin human, chlorpheniramine-HYDROcodone, fentaNYL (SUBLIMAZE) injection, fluticasone, metoCLOPramide, naLOXone (NARCAN)  injection, prochlorperazine, senna-docusate   Irena Cords,  MD 11/13/2023, 8:25 AM

## 2023-11-13 NOTE — Progress Notes (Signed)
 PT Cancellation Note  Patient Details Name: [name] MRN: [id number] DOB: 23-Jun-1956   Cancelled Treatment:    Reason Eval/Treat Not Completed: Fatigue/lethargy limiting ability to participate  Patient in dialysis this morning. Discussed role of PT and encouraged participation based on history of falls. Patient reporting she is just too tired and sick after dialysis today (reports they pulled off 3L which is a lot for her). Patient willing to work with PT, just not at this time. Will attempt 3/26 a.m.   Jerolyn Center, PT Acute Rehabilitation Services  Office [phone]  Zena Amos 11/13/2023, 3:04 PM

## 2023-11-13 NOTE — Progress Notes (Signed)
  Progress Note   Patient: [name] ZOX:[id number] DOB: Nov 02, 1955 DOA: 11/09/2023     4 DOS: the patient was seen and examined on 11/13/2023   Brief hospital course: 68 year old female with past medical history of ESRD [T/Th/Sat], atrial fibrillation, HTN, HLD, T2DM, COPD, DVT, OSA, asthma, NICM, sp TVR.  She presents with complaint of trouble breathing, coughing, chest pains since Wednesday.  Her shortness of breath has been getting progressively worse as has her chest pain.  She endorses palpitations, seizures and compliance with her medication.  She denies lightheadedness or dizziness.  Per patient yesterday told her that shortness of breath, the coughing, the chest pains.  She came to the ER.   In the ER presenting vitals 121/100, 123.  Troponin 9=>10, EKG A-fib with RVR heart rate 130, QTc 531.  Respiratory panel negative.  Patient given IV Cardizem without improvement.  Amiodarone drip initiated.  Cardiologist Dr. Regino Schultze consulted.  Assessment and Plan:  Atrial fibrillation with RVR (HCC) -Continued on amiodarone drip, eliquis, metoprolol -Cardiology following. Recs to continue amio gtt. Ultimately plan to transition to po amio per Cardiology -Rapid fib thought to be related to recent URI symptoms, per below  URI -afebrile, no leukocytosis -Symptoms now nearly one week without much improvement -Repeat CXR with cardiomegaly, otherwise neg -tenderness over maxillary sinuses on exam -Started renally dosed augmentin 11/12/23    Anxiety and depression -Cont BuSpar, Cymbalta    Chronic deep vein thrombosis (DVT) of other vein of right upper extremity  -cont eliquis as tolerated    COPD (chronic obstructive pulmonary disease) (HCC) -Flonase, inhaler as needed    Essential hypertension //  NICM //  sp TVR //  sp AVR -Metoprolol resumed -cont eliquis    HLD -Cont statin    T2DM //  gastroparesis -Continue Lantus 10 units subcu daily  -Continue Reglan -cont w/ SSI as  needed    ESRD //  orthostatic hypotension -Nephrology following -Cont with HD as scheduled. Seen on HD this AM    OSA -CPAP ordered    Gout -Colchicine resumed    QT prolongation      Subjective: Reports feeling somewhat better this AM  Physical Exam: Vitals:   11/13/23 1157 11/13/23 1217 11/13/23 1230 11/13/23 1232  BP: (!) 95/59 101/67  106/64  Pulse: 77 80  78  Resp: 20 (!) 21  20  Temp: 98 F (36.7 C)   98 F (36.7 C)  TempSrc:    Oral  SpO2: 100% 98%  98%  Weight:   129 kg   Height:       General exam: Conversant, in no acute distress Respiratory system: normal chest rise, clear, no audible wheezing Cardiovascular system: regular rhythm, s1-s2 Gastrointestinal system: Nondistended, nontender, pos BS Central nervous system: No seizures, no tremors Extremities: No cyanosis, no joint deformities Skin: No rashes, no pallor Psychiatry: Affect normal // no auditory hallucinations   Data Reviewed:  Labs reviewed: Na 134, K 4.1, Cr 8.96, WBC 7.5, Hgb 8.1 Plts 157  Family Communication: Pt in room, family not at bedside  Disposition: Status is: Inpatient Remains inpatient appropriate because: severity of illness  Planned Discharge Destination: Home    Author: Rickey Barbara, MD 11/13/2023 3:28 PM  For on call review [email or web address].

## 2023-11-13 NOTE — Progress Notes (Signed)
 Rounding Note    Patient Name: [name] Date of Encounter: 11/13/2023  [facility] Cardiologist: Jodelle Red, MD   Subjective   Still coughing and sneezing all night but did get better rest Just getting ready to start HD  Inpatient Medications    Scheduled Meds:  amoxicillin-clavulanate  1 tablet Oral BID   apixaban  5 mg Oral BID   busPIRone  5 mg Oral BID   DULoxetine  60 mg Oral Daily   heparin sodium (porcine)  3,200 Units Intracatheter Once   insulin aspart  0-15 Units Subcutaneous TID WC   insulin aspart  0-5 Units Subcutaneous QHS   insulin glargine-yfgn  10 Units Subcutaneous QHS   metoprolol tartrate  12.5 mg Oral BID   midodrine  10 mg Oral Q T,Th,Sa-HD   oxyCODONE  5 mg Oral Once   pantoprazole  40 mg Oral Daily   rosuvastatin  10 mg Oral Daily   Continuous Infusions:  amiodarone 30 mg/hr (11/13/23 0302)   PRN Meds: acetaminophen **OR** acetaminophen, chlorpheniramine-HYDROcodone, fentaNYL (SUBLIMAZE) injection, fluticasone, metoCLOPramide, naLOXone (NARCAN)  injection, prochlorperazine, senna-docusate   Vital Signs    Vitals:   11/12/23 1124 11/12/23 1741 11/12/23 2102 11/13/23 0623  BP: (!) 92/58 (!) 91/55 108/66 (!) 93/52  Pulse: 64 97 84   Resp: 18 20  18   Temp: 97.6 F (36.4 C) 98.2 F (36.8 C) 98.7 F (37.1 C) 98.1 F (36.7 C)  TempSrc: Oral Axillary Oral Oral  SpO2: 96% 98% 99% 97%  Weight:      Height:        Intake/Output Summary (Last 24 hours) at 11/13/2023 0732 Last data filed at 11/13/2023 0302 Gross per 24 hour  Intake 970.63 ml  Output --  Net 970.63 ml      11/11/2023    1:43 PM 11/10/2023    8:03 AM 09/21/2023    8:51 AM  Last 3 Weights  Weight (lbs) 281 lb 4.9 oz 279 lb 11.2 oz 271 lb  Weight (kg) 127.6 kg 126.871 kg 122.925 kg      Telemetry    AFib 90's-100's - Personally Reviewed  ECG    No new EKGs - Personally Reviewed  Physical Exam   GEN: No acute distress.   Neck: No  JVD Cardiac: RRR, no murmurs, rubs, or gallops.  Respiratory: coarse bronchial BS b/l, less wheezing GI: Soft, nontender, non-distended  MS: No edema; No deformity. Neuro:  Nonfocal  Psych: Normal affect   Labs    High Sensitivity Troponin:   Recent Labs  Lab 11/09/23 1634 11/09/23 1820  TROPONINIHS 9 10     Chemistry Recent Labs  Lab 11/09/23 1820 11/10/23 0620 11/11/23 0400 11/13/23 0421  NA  --  136 135 134*  K  --  4.2 4.7 4.1  CL  --  94* 94* 94*  CO2  --  27 24 22   GLUCOSE  --  179* 137* 99  BUN  --  29* 38* 38*  CREATININE  --  7.68* 9.83* 8.96*  CALCIUM  --  9.8 9.8 9.4  MG 2.0  --  2.1  --   PROT  --   --  6.9  --   ALBUMIN  --   --  3.2* 3.3*  AST  --   --  15  --   ALT  --   --  9  --   ALKPHOS  --   --  64  --  BILITOT  --   --  0.8  --   GFRNONAA  --  5* 4* 4*  ANIONGAP  --  15 17* 18*    Lipids No results for input(s): "CHOL", "TRIG", "HDL", "LABVLDL", "LDLCALC", "CHOLHDL" in the last 168 hours.  Hematology Recent Labs  Lab 11/10/23 0620 11/11/23 0400 11/13/23 0421  WBC 8.8 7.8 7.5  RBC 2.80* 2.81* 2.52*  HGB 8.9* 9.0* 8.1*  HCT 28.9* 28.0* 25.5*  MCV 103.2* 99.6 101.2*  MCH 31.8 32.0 32.1  MCHC 30.8 32.1 31.8  RDW 15.9* 15.8* 16.3*  PLT 150 148* 157   Thyroid No results for input(s): "TSH", "FREET4" in the last 168 hours.  BNP Recent Labs  Lab 11/09/23 1634  BNP 278.7*    DDimer No results for input(s): "DDIMER" in the last 168 hours.   Radiology    DG CHEST PORT 1 VIEW Result Date: 11/12/2023 CLINICAL DATA:  Pneumonia EXAM: PORTABLE CHEST 1 VIEW COMPARISON:  11/09/2023 FINDINGS: Stable cardiomegaly. Status post sternotomy and cardiac valve replacement. Aortic atherosclerosis. No focal airspace consolidation, pleural effusion, or pneumothorax. IMPRESSION: Cardiomegaly. No acute cardiopulmonary findings. Electronically Signed   By: Duanne Guess D.O.   On: 11/12/2023 14:23    Cardiac Studies   10/15/23: TTE 1. 3D EF 69%.  Left ventricular ejection fraction, by estimation, is 55 to  60%. The left ventricle has normal function. The left ventricle has no  regional wall motion abnormalities. Left ventricular diastolic parameters  are indeterminate.   2. Right ventricular systolic function is mildly reduced. The right  ventricular size is mildly enlarged.   3. Left atrial size was severely dilated.   4. Right atrial size was severely dilated.   5. Post repair with ring Mild residual MR Mean diastolic gradient 8 mmHg  at HR 68 bpm. The mitral valve has been repaired/replaced. No evidence of  mitral valve regurgitation. No evidence of mitral stenosis.   6. Post repair with ring. The tricuspid valve is has been  repaired/replaced. Tricuspid valve regurgitation is moderate.   7. The aortic valve is tricuspid. Aortic valve regurgitation is not  visualized. No aortic stenosis is present.   8. The inferior vena cava is normal in size with greater than 50%  respiratory variability, suggesting right atrial pressure of 3 mmHg.    Patient Profile     69 y.o. female w/PMHx of DM, HTN, HLD chronic CHF (mixed systolic/diastolic), DVT, COPD/asthma ESRF on HD Long hx of AFib VHD (MV and TV repairs/MAZE/LAA clipping w/AtriClip, 45 mm 12/30/2020)  Tikosyn failed to maintain SR (2022) Amiodarone started 2022   ADMITTED this admission with URI symptoms, pleuritic sound CP EP consulted for afib w/RVR  Assessment & Plan    Persistent AFib CHA2DS2Vasc is 7, on Eliquis Amiodarone >> IV gtt here HRs much better >> PO today   Volume with HD Reports BPs here are her baseline (90's/60's) HD days midodrine resumed >> hopefully they can pull some fluid today  Further with IM/attending/nephrology teams    For questions or updates, please contact [facility] Please consult [email or web address] for contact info under        Signed, Sheilah Pigeon, PA-C  11/13/2023, 7:32 AM

## 2023-11-13 NOTE — Progress Notes (Signed)
 Received patient in bed to unit.  Alert and oriented.  Informed consent signed and in chart.   TX duration:3.5  Patient tolerated well.  Transported back to the room  Alert, without acute distress.  Hand-off given to patient's nurse.   Access used: LAVF Access issues: none  Total UF removed: 3L Medication(s) given: albumin, midodrine, heparin bolus   11/13/23 1157  Vitals  Temp 98 F (36.7 C)  BP (!) 95/59  MAP (mmHg) 69  Pulse Rate 77  ECG Heart Rate 76  Resp 20  Oxygen Therapy  SpO2 100 %  During Treatment Monitoring  Blood Flow Rate (mL/min) 399 mL/min  Arterial Pressure (mmHg) -184.43 mmHg  Venous Pressure (mmHg) 208.27 mmHg  TMP (mmHg) 19.59 mmHg  Ultrafiltration Rate (mL/min) 1060 mL/min  Dialysate Flow Rate (mL/min) 299 ml/min  Dialysate Potassium Concentration 3  Dialysate Calcium Concentration 2.5  Duration of HD Treatment -hour(s) 3.49 hour(s)  Cumulative Fluid Removed (mL) per Treatment  2990.45  HD Safety Checks Performed Yes  Intra-Hemodialysis Comments Tx completed  Post Treatment  Dialyzer Clearance Lightly streaked  Liters Processed 84  Fluid Removed (mL) 3000 mL  Tolerated HD Treatment Yes  AVG/AVF Arterial Site Held (minutes) 5 minutes  AVG/AVF Venous Site Held (minutes) 5 minutes  Fistula / Graft Left Upper arm  No placement date or time found.   Placed prior to admission: Yes  Orientation: Left  Access Location: Upper arm  Site Condition No complications  Status Deaccessed  Drainage Description None    Freddi Starr, RN [facility]

## 2023-11-13 NOTE — Progress Notes (Signed)
 Pt receives out-pt HD at [facility] [place] on TTS 6:50 am chair time. Will assist as needed.   Olivia Canter Renal Navigator [phone]

## 2023-11-14 ENCOUNTER — Other Ambulatory Visit (HOSPITAL_COMMUNITY): Payer: Self-pay

## 2023-11-14 ENCOUNTER — Encounter (INDEPENDENT_AMBULATORY_CARE_PROVIDER_SITE_OTHER): Admitting: Primary Care

## 2023-11-14 DIAGNOSIS — I4891 Unspecified atrial fibrillation: Secondary | ICD-10-CM | POA: Diagnosis not present

## 2023-11-14 LAB — COMPREHENSIVE METABOLIC PANEL
ALT: 10 U/L (ref 0–44)
AST: 13 U/L — ABNORMAL LOW (ref 15–41)
Albumin: 3.9 g/dL (ref 3.5–5.0)
Alkaline Phosphatase: 70 U/L (ref 38–126)
Anion gap: 14 (ref 5–15)
BUN: 31 mg/dL — ABNORMAL HIGH (ref 8–23)
CO2: 26 mmol/L (ref 22–32)
Calcium: 9.9 mg/dL (ref 8.9–10.3)
Chloride: 94 mmol/L — ABNORMAL LOW (ref 98–111)
Creatinine, Ser: 6.93 mg/dL — ABNORMAL HIGH (ref 0.44–1.00)
GFR, Estimated: 6 mL/min — ABNORMAL LOW (ref >60)
Glucose, Bld: 120 mg/dL — ABNORMAL HIGH (ref 70–99)
Potassium: 4.6 mmol/L (ref 3.5–5.1)
Sodium: 134 mmol/L — ABNORMAL LOW (ref 135–145)
Total Bilirubin: 0.9 mg/dL (ref 0.0–1.2)
Total Protein: 7.7 g/dL (ref 6.5–8.1)

## 2023-11-14 LAB — CBC
HCT: 28.1 % — ABNORMAL LOW (ref 36.0–46.0)
Hemoglobin: 8.8 g/dL — ABNORMAL LOW (ref 12.0–15.0)
MCH: 31.5 pg (ref 26.0–34.0)
MCHC: 31.3 g/dL (ref 30.0–36.0)
MCV: 100.7 fL — ABNORMAL HIGH (ref 80.0–100.0)
Platelets: 171 10*3/uL (ref 150–400)
RBC: 2.79 MIL/uL — ABNORMAL LOW (ref 3.87–5.11)
RDW: 16.7 % — ABNORMAL HIGH (ref 11.5–15.5)
WBC: 7.6 10*3/uL (ref 4.0–10.5)
nRBC: 0 % (ref 0.0–0.2)

## 2023-11-14 LAB — GLUCOSE, CAPILLARY: Glucose-Capillary: 83 mg/dL (ref 70–99)

## 2023-11-14 MED ORDER — AMOXICILLIN-POT CLAVULANATE 500-125 MG PO TABS
1.0000 | ORAL_TABLET | Freq: Two times a day (BID) | ORAL | 0 refills | Status: AC
  Filled 2023-11-14: qty 10, 5d supply, fill #0

## 2023-11-14 MED ORDER — AMIODARONE HCL 200 MG PO TABS
400.0000 mg | ORAL_TABLET | Freq: Every day | ORAL | 0 refills | Status: DC
  Filled 2023-11-14: qty 60, 30d supply, fill #0

## 2023-11-14 MED ORDER — HYDROCOD POLI-CHLORPHE POLI ER 10-8 MG/5ML PO SUER
5.0000 mL | Freq: Two times a day (BID) | ORAL | 0 refills | Status: DC | PRN
  Filled 2023-11-14: qty 70, 7d supply, fill #0

## 2023-11-14 NOTE — Progress Notes (Signed)
 Occupational Therapy Treatment Patient Details Name: [name] MRN: [id number] DOB: 1956-02-12 Today's Date: 11/14/2023   History of present illness Patient is a 68 y.o. female admitted 11/09/23 with chest pain, SOB and coughing on 11/09/23.Admitted with Afib with RVR.  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   OT comments  Pt received in supine, agreeable to session. Pt able to get to EOB with supervision, completed sit to stand and ambulated to sink with RW with supervision. Pt completed self care, UB bathing and grooming while seated at sink. Pt with minor c/o dizziness but was noted to subside when seated. Pt able to complete ADLs with supervision. Pt declined staying up in recliner and was able to return to bed and complete sit<>supine transfer with supervision. Pt left supine in bed with breakfast with all needs met. Acute OT to continue to follow to address established goals to facilitate DC to next venue of care.        If plan is discharge home, recommend the following:  A little help with walking and/or transfers;A little help with bathing/dressing/bathroom;Assistance with cooking/housework   Equipment Recommendations  Tub/shower bench    Recommendations for Other Services      Precautions / Restrictions Precautions Precautions: Fall Recall of Precautions/Restrictions: Intact Restrictions Weight Bearing Restrictions Per Provider Order: No       Mobility Bed Mobility Overal bed mobility: Needs Assistance Bed Mobility: Supine to Sit, Sit to Supine     Supine to sit: HOB elevated, Used rails, Supervision Sit to supine: Supervision   General bed mobility comments: pt able to get to EOB and from EOB to supine with supervision    Transfers Overall transfer level: Needs assistance Equipment used: Rolling walker (2 wheels) Transfers: Sit to/from Stand Sit to Stand: Supervision            General transfer comment: pt able to transfer sit to stand fairly well with supervision using RW     Balance Overall balance assessment: Needs assistance Sitting-balance support: No upper extremity supported, Feet supported Sitting balance-Leahy Scale: Good     Standing balance support: Bilateral upper extremity supported, Reliant on assistive device for balance, During functional activity Standing balance-Leahy Scale: Poor Standing balance comment: reliant on RW for balance while ambulating                           ADL either performed or assessed with clinical judgement   ADL Overall ADL's : Needs assistance/impaired     Grooming: Wash/dry hands;Wash/dry face;Oral care;Applying deodorant;Brushing hair;Supervision/safety;Sitting Grooming Details (indicate cue type and reason): pt sat at sink to complete grooming tasks Upper Body Bathing: Supervision/ safety;Sitting Upper Body Bathing Details (indicate cue type and reason): bathed UB at sink (needed assistance to bathe back) Lower Body Bathing: Supervison/ safety;Sitting/lateral leans Lower Body Bathing Details (indicate cue type and reason): completing in sitting at sink Upper Body Dressing : Supervision/safety;Sitting Upper Body Dressing Details (indicate cue type and reason): donning new gown                 Functional mobility during ADLs: Supervision/safety General ADL Comments: pt completed ADLs at sink while seated    Extremity/Trunk Assessment Upper Extremity Assessment Upper Extremity Assessment: Defer to OT evaluation   Lower Extremity Assessment Lower Extremity Assessment: Generalized weakness   Cervical / Trunk Assessment Cervical / Trunk Assessment: Other exceptions Cervical /  Trunk Exceptions: Body Habitus    Vision       Perception     Praxis     Communication Communication Communication: No apparent difficulties   Cognition Arousal: Alert Behavior During Therapy: WFL  for tasks assessed/performed Cognition: No apparent impairments                               Following commands: Intact        Cueing   Cueing Techniques: Verbal cues  Exercises      Shoulder Instructions       General Comments pt tolerated treatment well, light c/o dizziness but subsided in sitting    Pertinent Vitals/ Pain       Pain Assessment Pain Assessment: No/denies pain Pain Intervention(s): Monitored during session  Home Living Family/patient expects to be discharged to:: Private residence Living Arrangements: Children Available Help at Discharge: Family;Available 24 hours/day Type of Home: Apartment Home Access: Level entry     Home Layout: One level     Bathroom Shower/Tub: Chief Strategy Officer: Standard     Home Equipment: Cane - single point;Rollator (4 wheels);Shower seat          Prior Functioning/Environment              Frequency  Min 2X/week        Progress Toward Goals  OT Goals(current goals can now be found in the care plan section)  Progress towards OT goals: Progressing toward goals  Acute Rehab OT Goals Patient Stated Goal: get better OT Goal Formulation: With patient Time For Goal Achievement: 11/26/23 Potential to Achieve Goals: Good ADL Goals Pt Will Perform Lower Body Bathing: with supervision;sitting/lateral leans;sit to/from stand;with adaptive equipment Pt Will Perform Lower Body Dressing: with supervision;sitting/lateral leans;sit to/from stand;with adaptive equipment Pt Will Transfer to Toilet: with supervision;ambulating;regular height toilet;grab bars Pt Will Perform Toileting - Clothing Manipulation and hygiene: with supervision;sit to/from stand;sitting/lateral leans Pt Will Perform Tub/Shower Transfer: with supervision;tub bench;ambulating;rolling walker Additional ADL Goal #1: Patient will be able to complete functional task in standing for 3-5 minutes without seated rest break  in order to increase activity tolerance.  Plan      Co-evaluation                 AM-PAC OT "6 Clicks" Daily Activity     Outcome Measure   Help from another person eating meals?: A Little Help from another person taking care of personal grooming?: A Little Help from another person toileting, which includes using toliet, bedpan, or urinal?: A Little Help from another person bathing (including washing, rinsing, drying)?: A Lot Help from another person to put on and taking off regular upper body clothing?: A Little Help from another person to put on and taking off regular lower body clothing?: Total 6 Click Score: 15    End of Session Equipment Utilized During Treatment: Rolling walker (2 wheels)  OT Visit Diagnosis: Unsteadiness on feet (R26.81);Other abnormalities of gait and mobility (R26.89);Repeated falls (R29.6);Muscle weakness (generalized) (M62.81);History of falling (Z91.81);Pain   Activity Tolerance Patient tolerated treatment well   Patient Left in bed;with call bell/phone within reach   Nurse Communication Mobility status        Time: 5366-4403 OT Time Calculation (min): 27 min  Charges: OT General Charges $OT Visit: 1 Visit OT Treatments $Self Care/Home Management : 23-37 mins  Brodie Correll, BS, OTA/S   Kemon Devincenzi 11/14/2023,  9:47 AM

## 2023-11-14 NOTE — Progress Notes (Signed)
 Rounding Note    Patient Name: [name] Date of Encounter: 11/14/2023  [facility] Cardiologist: Jodelle Red, MD   Subjective   Sleeping comfortably as we enter, easily woken, feeling less SOB  Inpatient Medications    Scheduled Meds:  amiodarone  400 mg Oral Daily   amoxicillin-clavulanate  1 tablet Oral BID   apixaban  5 mg Oral BID   busPIRone  5 mg Oral BID   DULoxetine  60 mg Oral Daily   heparin sodium (porcine)  3,200 Units Intracatheter Once   insulin aspart  0-15 Units Subcutaneous TID WC   insulin aspart  0-5 Units Subcutaneous QHS   insulin glargine-yfgn  10 Units Subcutaneous QHS   metoprolol tartrate  12.5 mg Oral BID   midodrine  10 mg Oral Q T,Th,Sa-HD   oxyCODONE  5 mg Oral Once   pantoprazole  40 mg Oral Daily   rosuvastatin  10 mg Oral Daily   Continuous Infusions:  albumin human 60 mL/hr at 11/13/23 1502   PRN Meds: acetaminophen **OR** acetaminophen, albumin human, chlorpheniramine-HYDROcodone, fentaNYL (SUBLIMAZE) injection, fluticasone, metoCLOPramide, naLOXone (NARCAN)  injection, prochlorperazine, senna-docusate   Vital Signs    Vitals:   11/13/23 2000 11/13/23 2333 11/14/23 0341 11/14/23 0708  BP: 97/66 103/62 106/60 102/67  Pulse: 75  81 70  Resp: (!) 24  18 18   Temp: 98 F (36.7 C)  97.8 F (36.6 C) 98 F (36.7 C)  TempSrc: Oral  Oral Oral  SpO2: 96%  93% 98%  Weight:      Height:        Intake/Output Summary (Last 24 hours) at 11/14/2023 0807 Last data filed at 11/13/2023 1502 Gross per 24 hour  Intake 633.42 ml  Output 3000 ml  Net -2366.58 ml      11/13/2023   12:30 PM 11/13/2023    9:22 AM 11/11/2023    1:43 PM  Last 3 Weights  Weight (lbs) 284 lb 6.3 oz 291 lb 0.1 oz 281 lb 4.9 oz  Weight (kg) 129 kg 132 kg 127.6 kg      Telemetry    AFib 70's - Personally Reviewed  ECG    No new EKGs - Personally Reviewed  Physical Exam   GEN: No acute distress.   Neck: No JVD Cardiac:  irre-irreg, no murmurs, rubs, or gallops.  Respiratory:  bronchial BS b/l, less wheezing GI: Soft, nontender, non-distended  MS: No edema; No deformity. Neuro:  Nonfocal  Psych: Normal affect   Labs    High Sensitivity Troponin:   Recent Labs  Lab 11/09/23 1634 11/09/23 1820  TROPONINIHS 9 10     Chemistry Recent Labs  Lab 11/09/23 1820 11/10/23 0620 11/11/23 0400 11/13/23 0421 11/14/23 0443  NA  --    < > 135 134* 134*  K  --    < > 4.7 4.1 4.6  CL  --    < > 94* 94* 94*  CO2  --    < > 24 22 26   GLUCOSE  --    < > 137* 99 120*  BUN  --    < > 38* 38* 31*  CREATININE  --    < > 9.83* 8.96* 6.93*  CALCIUM  --    < > 9.8 9.4 9.9  MG 2.0  --  2.1  --   --   PROT  --   --  6.9  --  7.7  ALBUMIN  --   --  3.2* 3.3* 3.9  AST  --   --  15  --  13*  ALT  --   --  9  --  10  ALKPHOS  --   --  64  --  70  BILITOT  --   --  0.8  --  0.9  GFRNONAA  --    < > 4* 4* 6*  ANIONGAP  --    < > 17* 18* 14   < > = values in this interval not displayed.    Lipids No results for input(s): "CHOL", "TRIG", "HDL", "LABVLDL", "LDLCALC", "CHOLHDL" in the last 168 hours.  Hematology Recent Labs  Lab 11/11/23 0400 11/13/23 0421 11/14/23 0443  WBC 7.8 7.5 7.6  RBC 2.81* 2.52* 2.79*  HGB 9.0* 8.1* 8.8*  HCT 28.0* 25.5* 28.1*  MCV 99.6 101.2* 100.7*  MCH 32.0 32.1 31.5  MCHC 32.1 31.8 31.3  RDW 15.8* 16.3* 16.7*  PLT 148* 157 171   Thyroid No results for input(s): "TSH", "FREET4" in the last 168 hours.  BNP Recent Labs  Lab 11/09/23 1634  BNP 278.7*    DDimer No results for input(s): "DDIMER" in the last 168 hours.   Radiology    DG CHEST PORT 1 VIEW Result Date: 11/12/2023 CLINICAL DATA:  Pneumonia EXAM: PORTABLE CHEST 1 VIEW COMPARISON:  11/09/2023 FINDINGS: Stable cardiomegaly. Status post sternotomy and cardiac valve replacement. Aortic atherosclerosis. No focal airspace consolidation, pleural effusion, or pneumothorax. IMPRESSION: Cardiomegaly. No acute cardiopulmonary  findings. Electronically Signed   By: Duanne Guess D.O.   On: 11/12/2023 14:23    Cardiac Studies   10/15/23: TTE 1. 3D EF 69%. Left ventricular ejection fraction, by estimation, is 55 to  60%. The left ventricle has normal function. The left ventricle has no  regional wall motion abnormalities. Left ventricular diastolic parameters  are indeterminate.   2. Right ventricular systolic function is mildly reduced. The right  ventricular size is mildly enlarged.   3. Left atrial size was severely dilated.   4. Right atrial size was severely dilated.   5. Post repair with ring Mild residual MR Mean diastolic gradient 8 mmHg  at HR 68 bpm. The mitral valve has been repaired/replaced. No evidence of  mitral valve regurgitation. No evidence of mitral stenosis.   6. Post repair with ring. The tricuspid valve is has been  repaired/replaced. Tricuspid valve regurgitation is moderate.   7. The aortic valve is tricuspid. Aortic valve regurgitation is not  visualized. No aortic stenosis is present.   8. The inferior vena cava is normal in size with greater than 50%  respiratory variability, suggesting right atrial pressure of 3 mmHg.    Patient Profile     68 y.o. female w/PMHx of DM, HTN, HLD chronic CHF (mixed systolic/diastolic), DVT, COPD/asthma ESRF on HD Long hx of AFib VHD (MV and TV repairs/MAZE/LAA clipping w/AtriClip, 45 mm 12/30/2020)  Tikosyn failed to maintain SR (2022) Amiodarone started 2022   ADMITTED this admission with URI symptoms, pleuritic sound CP EP consulted for afib w/RVR  Assessment & Plan    Persistent AFib CHA2DS2Vasc is 7, on Eliquis Amiodarone >> IV gtt here Transitioned to Amiodarone 400mg  daily on 3/25 Rates remain well controlled   She has out pt f/u already in place: --- 11/23/23 with heart failure team. Continue amiodarone 400mg  daily until seen at this visit > recommend plans to reduce amiodarone dose at this visit to 200mg  daily and consider  plans for  DCCV once  re-evaluate by their service ---- Dr. Cristal Deer (gen cards team) on 12/31/23  > timing again excellent (presumed post DCCV at this time EP follow up will be arranged as well > though think we can follow afterwards   Volume with HD Reports BPs here are her baseline (90's/60's) HD days midodrine resumed >> was able to pull 3L looks like yesterday  Further with IM/attending/nephrology teams   Dr. Lalla Brothers has seen the patient EP will sign off though remain available Please recall if needed  For questions or updates, please contact [facility] Please consult [email or web address] for contact info under        Signed, Sheilah Pigeon, PA-C  11/14/2023, 8:07 AM

## 2023-11-14 NOTE — Progress Notes (Signed)
 Pt d/c today. Contacted [facility] to be advised of pt's d/c today and that pt should resume care tomorrow.   Olivia Canter Renal Navigator [phone]

## 2023-11-14 NOTE — Discharge Planning (Signed)
 [facility] Patient Discharge Orders- [facility] CLINIC: [facility]  Patient's name: LATEISHA THURLOW Admit/DC Dates: 11/09/2023 - 11/14/2023  Discharge Diagnoses: Sinusitis/URI - On PO ABXs, see below  Afib with RVR - On PO Amiodarone, Metoprolol, and Eliquis. Thought to be related to URI COPD -Flonase inhaler  Aranesp: Given: No     Last Hgb: 8.8 PRBC's Given: No ESA dose for discharge: mircera 150 mcg IV q 2 weeks  IV Iron dose at discharge: resume Fe load  Heparin change: No  EDW Change: No  Bath Change: No  Access intervention/Change: No  Hectorol change: No  Discharge Labs: Calcium 9.9 Phosphorus 5.4 Albumin 3.9 K+ 4.6  IV Antibiotics: Yes Details: Sinusitis. On PO Augmentin 500mg -125mg  X 5 days  On Coumadin?: No. On Eliquis   OTHER/APPTS/LAB ORDERS:    D/C Meds to be reconciled by nurse after every discharge.  Completed By: Salome Holmes, NP   Reviewed by: MD:______ RN_______

## 2023-11-14 NOTE — Care Management Important Message (Signed)
 Important Message  Patient Details  Name: [name] MRN: [id number] Date of Birth: 1955/10/12   Important Message Given:  Yes - Medicare IM     [name] 11/14/2023, 10:40 AM

## 2023-11-14 NOTE — Plan of Care (Signed)
 Problem: Education: Goal: Ability to describe self-care measures that may prevent or decrease complications (Diabetes Survival Skills Education) will improve 11/14/2023 1007 by Sylvan Cheese, RN Outcome: Adequate for Discharge 11/14/2023 1007 by Sylvan Cheese, RN Outcome: Adequate for Discharge Goal: Individualized Educational Video(s) 11/14/2023 1007 by Sylvan Cheese, RN Outcome: Adequate for Discharge 11/14/2023 1007 by Sylvan Cheese, RN Outcome: Adequate for Discharge   Problem: Coping: Goal: Ability to adjust to condition or change in health will improve 11/14/2023 1007 by Sylvan Cheese, RN Outcome: Adequate for Discharge 11/14/2023 1007 by Sylvan Cheese, RN Outcome: Adequate for Discharge   Problem: Fluid Volume: Goal: Ability to maintain a balanced intake and output will improve 11/14/2023 1007 by Sylvan Cheese, RN Outcome: Adequate for Discharge 11/14/2023 1007 by Sylvan Cheese, RN Outcome: Adequate for Discharge   Problem: Health Behavior/Discharge Planning: Goal: Ability to identify and utilize available resources and services will improve 11/14/2023 1007 by Sylvan Cheese, RN Outcome: Adequate for Discharge 11/14/2023 1007 by Sylvan Cheese, RN Outcome: Adequate for Discharge Goal: Ability to manage health-related needs will improve 11/14/2023 1007 by Sylvan Cheese, RN Outcome: Adequate for Discharge 11/14/2023 1007 by Sylvan Cheese, RN Outcome: Adequate for Discharge   Problem: Metabolic: Goal: Ability to maintain appropriate glucose levels will improve 11/14/2023 1007 by Sylvan Cheese, RN Outcome: Adequate for Discharge 11/14/2023 1007 by Sylvan Cheese, RN Outcome: Adequate for Discharge   Problem: Nutritional: Goal: Maintenance of adequate nutrition will improve 11/14/2023 1007 by Sylvan Cheese, RN Outcome: Adequate for Discharge 11/14/2023 1007 by Sylvan Cheese, RN Outcome: Adequate for  Discharge Goal: Progress toward achieving an optimal weight will improve 11/14/2023 1007 by Sylvan Cheese, RN Outcome: Adequate for Discharge 11/14/2023 1007 by Sylvan Cheese, RN Outcome: Adequate for Discharge   Problem: Skin Integrity: Goal: Risk for impaired skin integrity will decrease 11/14/2023 1007 by Sylvan Cheese, RN Outcome: Adequate for Discharge 11/14/2023 1007 by Sylvan Cheese, RN Outcome: Adequate for Discharge   Problem: Tissue Perfusion: Goal: Adequacy of tissue perfusion will improve 11/14/2023 1007 by Sylvan Cheese, RN Outcome: Adequate for Discharge 11/14/2023 1007 by Sylvan Cheese, RN Outcome: Adequate for Discharge   Problem: Education: Goal: Knowledge of General Education information will improve Description: Including pain rating scale, medication(s)/side effects and non-pharmacologic comfort measures 11/14/2023 1007 by Sylvan Cheese, RN Outcome: Adequate for Discharge 11/14/2023 1007 by Sylvan Cheese, RN Outcome: Adequate for Discharge   Problem: Health Behavior/Discharge Planning: Goal: Ability to manage health-related needs will improve 11/14/2023 1007 by Sylvan Cheese, RN Outcome: Adequate for Discharge 11/14/2023 1007 by Sylvan Cheese, RN Outcome: Adequate for Discharge   Problem: Clinical Measurements: Goal: Ability to maintain clinical measurements within normal limits will improve 11/14/2023 1007 by Sylvan Cheese, RN Outcome: Adequate for Discharge 11/14/2023 1007 by Sylvan Cheese, RN Outcome: Adequate for Discharge Goal: Will remain free from infection 11/14/2023 1007 by Sylvan Cheese, RN Outcome: Adequate for Discharge 11/14/2023 1007 by Sylvan Cheese, RN Outcome: Adequate for Discharge Goal: Diagnostic test results will improve 11/14/2023 1007 by Sylvan Cheese, RN Outcome: Adequate for Discharge 11/14/2023 1007 by Sylvan Cheese, RN Outcome: Adequate for Discharge Goal:  Respiratory complications will improve 11/14/2023 1007 by Sylvan Cheese, RN Outcome: Adequate for Discharge 11/14/2023 1007 by Sylvan Cheese, RN Outcome: Adequate for Discharge Goal: Cardiovascular complication will be avoided 11/14/2023 1007 by Sylvan Cheese, RN Outcome:  Adequate for Discharge 11/14/2023 1007 by Sylvan Cheese, RN Outcome: Adequate for Discharge   Problem: Activity: Goal: Risk for activity intolerance will decrease 11/14/2023 1007 by Sylvan Cheese, RN Outcome: Adequate for Discharge 11/14/2023 1007 by Sylvan Cheese, RN Outcome: Adequate for Discharge   Problem: Nutrition: Goal: Adequate nutrition will be maintained 11/14/2023 1007 by Sylvan Cheese, RN Outcome: Adequate for Discharge 11/14/2023 1007 by Sylvan Cheese, RN Outcome: Adequate for Discharge   Problem: Coping: Goal: Level of anxiety will decrease 11/14/2023 1007 by Sylvan Cheese, RN Outcome: Adequate for Discharge 11/14/2023 1007 by Sylvan Cheese, RN Outcome: Adequate for Discharge   Problem: Elimination: Goal: Will not experience complications related to bowel motility 11/14/2023 1007 by Sylvan Cheese, RN Outcome: Adequate for Discharge 11/14/2023 1007 by Sylvan Cheese, RN Outcome: Adequate for Discharge Goal: Will not experience complications related to urinary retention 11/14/2023 1007 by Sylvan Cheese, RN Outcome: Adequate for Discharge 11/14/2023 1007 by Sylvan Cheese, RN Outcome: Adequate for Discharge   Problem: Pain Managment: Goal: General experience of comfort will improve and/or be controlled 11/14/2023 1007 by Sylvan Cheese, RN Outcome: Adequate for Discharge 11/14/2023 1007 by Sylvan Cheese, RN Outcome: Adequate for Discharge   Problem: Safety: Goal: Ability to remain free from injury will improve 11/14/2023 1007 by Sylvan Cheese, RN Outcome: Adequate for Discharge 11/14/2023 1007 by Sylvan Cheese,  RN Outcome: Adequate for Discharge   Problem: Skin Integrity: Goal: Risk for impaired skin integrity will decrease 11/14/2023 1007 by Sylvan Cheese, RN Outcome: Adequate for Discharge 11/14/2023 1007 by Sylvan Cheese, RN Outcome: Adequate for Discharge

## 2023-11-14 NOTE — Discharge Summary (Signed)
 [facility]  Physician Discharge Summary   Patient ID: [name] MRN: [id number] DOB/AGE: [birth year] 68 y.o.  Admit date: 11/09/2023 Discharge date: 11/14/2023    PCP: Grayce Sessions, NP  DISCHARGE DIAGNOSES:    Atrial fibrillation with RVR (HCC)   ESRD on dialysis (HCC)   OSA on CPAP   Essential hypertension   COPD (chronic obstructive pulmonary disease) (HCC)   Type 2 diabetes mellitus with hyperlipidemia (HCC)   Mitral regurgitation   S/P mitral valve repair   S/P tricuspid valve repair   QT prolongation   Anxiety and depression   Chronic deep vein thrombosis (DVT) of other vein of right upper extremity (HCC)   HLD (hyperlipidemia)   RECOMMENDATIONS FOR OUTPATIENT FOLLOW UP: Cardiology to schedule outpatient follow-up Patient to keep up with her usual hemodialysis schedule   Home Health: None Equipment/Devices: None  CODE STATUS: Full code  DISCHARGE CONDITION: fair  Diet recommendation: As before  INITIAL HISTORY: 68 year old female with past medical history of ESRD [T/Th/Sat], atrial fibrillation, HTN, HLD, T2DM, COPD, DVT, OSA, asthma, NICM, sp TVR.  She presents with complaint of trouble breathing, coughing, chest pains since Wednesday.  Her shortness of breath has been getting progressively worse as has her chest pain.  She endorses palpitations, seizures and compliance with her medication.  She denies lightheadedness or dizziness.  Per patient yesterday told her that shortness of breath, the coughing, the chest pains.  She came to the ER.   In the ER presenting vitals 121/100, 123.  Troponin 9=>10, EKG A-fib with RVR heart rate 130, QTc 531.  Respiratory panel negative.  Patient given IV Cardizem without improvement.  Amiodarone drip initiated.  HOSPITAL COURSE:   Atrial fibrillation with RVR (HCC) Seen by cardiology.  Was placed on amiodarone infusion.  Improvement in heart rate noted.  Transition to oral amiodarone.  Continue with  anticoagulation.  Also noted to be on metoprolol.   URI -afebrile, no leukocytosis -Symptoms now nearly one week without much improvement -Repeat CXR with cardiomegaly, otherwise neg -tenderness over maxillary sinuses on exam -Started renally dosed augmentin 11/12/23.  Will be continued for few more days at discharge.   Anxiety and depression -Cont BuSpar, Cymbalta   Chronic deep vein thrombosis (DVT) of other vein of right upper extremity  -cont eliquis as tolerated   COPD (chronic obstructive pulmonary disease) (HCC) -Flonase, inhaler as needed   Essential hypertension //  NICM //  sp TVR //  sp AVR -Metoprolol resumed -cont eliquis   HLD -Cont statin   T2DM //  gastroparesis Continue home regimen.   ESRD //  orthostatic hypotension Continue with dialysis in the outpatient setting.    OSA -CPAP ordered    Gout -Colchicine resumed    QT prolongation     Class III obesity Estimated body mass index is 44.54 kg/m as calculated from the following:   Height as of this encounter: 5\' 7"  (1.702 m).   Weight as of this encounter: 129 kg.   Patient has ambulated with physical therapy.  Heart rate is much better controlled.  Saturations are normal on room air.  Cleared by cardiology.  Discussed with nephrology.  Okay for discharge.   PERTINENT LABS:  The results of significant diagnostics from this hospitalization (including imaging, microbiology, ancillary and laboratory) are listed below for reference.    Microbiology: Recent Results (from the past 240 hours)  Resp panel by RT-PCR (RSV, Flu A&B, Covid) Anterior Nasal Swab     Status: None  Collection Time: 11/09/23  4:34 PM   Specimen: Anterior Nasal Swab  Result Value Ref Range Status   SARS Coronavirus 2 by RT PCR NEGATIVE NEGATIVE Final   Influenza A by PCR NEGATIVE NEGATIVE Final   Influenza B by PCR NEGATIVE NEGATIVE Final    Comment: (NOTE) The Xpert Xpress SARS-CoV-2/FLU/RSV plus assay is intended as an  aid in the diagnosis of influenza from Nasopharyngeal swab specimens and should not be used as a sole basis for treatment. Nasal washings and aspirates are unacceptable for Xpert Xpress SARS-CoV-2/FLU/RSV testing.  Fact Sheet for Patients: BloggerCourse.com  Fact Sheet for Healthcare Providers: SeriousBroker.it  This test is not yet approved or cleared by the [place] FDA and has been authorized for detection and/or diagnosis of SARS-CoV-2 by FDA under an Emergency Use Authorization (EUA). This EUA will remain in effect (meaning this test can be used) for the duration of the COVID-19 declaration under Section 564(b)(1) of the Act, 21 U.S.C. section 360bbb-3(b)(1), unless the authorization is terminated or revoked.     Resp Syncytial Virus by PCR NEGATIVE NEGATIVE Final    Comment: (NOTE) Fact Sheet for Patients: BloggerCourse.com  Fact Sheet for Healthcare Providers: SeriousBroker.it  This test is not yet approved or cleared by the [place] FDA and has been authorized for detection and/or diagnosis of SARS-CoV-2 by FDA under an Emergency Use Authorization (EUA). This EUA will remain in effect (meaning this test can be used) for the duration of the COVID-19 declaration under Section 564(b)(1) of the Act, 21 U.S.C. section 360bbb-3(b)(1), unless the authorization is terminated or revoked.  Performed at [facility], [address], [place], [place] [address]      Labs:   Basic Metabolic Panel: Recent Labs  Lab 11/09/23 1634 11/09/23 1820 11/10/23 0620 11/11/23 0400 11/13/23 0421 11/14/23 0443  NA 139  --  136 135 134* 134*  K 4.2  --  4.2 4.7 4.1 4.6  CL 93*  --  94* 94* 94* 94*  CO2 28  --  27 24 22 26   GLUCOSE 115*  --  179* 137* 99 120*  BUN 22  --  29* 38* 38* 31*  CREATININE 6.54*  --  7.68* 9.83* 8.96* 6.93*  CALCIUM 10.1  --  9.8 9.8 9.4  9.9  MG  --  2.0  --  2.1  --   --   PHOS  --   --   --   --  5.4*  --    Liver Function Tests: Recent Labs  Lab 11/11/23 0400 11/13/23 0421 11/14/23 0443  AST 15  --  13*  ALT 9  --  10  ALKPHOS 64  --  70  BILITOT 0.8  --  0.9  PROT 6.9  --  7.7  ALBUMIN 3.2* 3.3* 3.9    CBC: Recent Labs  Lab 11/09/23 1634 11/10/23 0620 11/11/23 0400 11/13/23 0421 11/14/23 0443  WBC 8.6 8.8 7.8 7.5 7.6  NEUTROABS  --  5.6  --  4.4  --   HGB 9.4* 8.9* 9.0* 8.1* 8.8*  HCT 29.7* 28.9* 28.0* 25.5* 28.1*  MCV 101.0* 103.2* 99.6 101.2* 100.7*  PLT 164 150 148* 157 171   BNP: BNP (last 3 results) Recent Labs    01/11/23 1230 03/14/23 1210 11/09/23 1634  BNP 144.1* 152.5* 278.7*     CBG: Recent Labs  Lab 11/13/23 0724 11/13/23 1249 11/13/23 1604 11/13/23 2146 11/14/23 0758  GLUCAP 104* 72 147* 109* 83  IMAGING STUDIES DG CHEST PORT 1 VIEW Result Date: 11/12/2023 CLINICAL DATA:  Pneumonia EXAM: PORTABLE CHEST 1 VIEW COMPARISON:  11/09/2023 FINDINGS: Stable cardiomegaly. Status post sternotomy and cardiac valve replacement. Aortic atherosclerosis. No focal airspace consolidation, pleural effusion, or pneumothorax. IMPRESSION: Cardiomegaly. No acute cardiopulmonary findings. Electronically Signed   By: Duanne Guess D.O.   On: 11/12/2023 14:23   CT CHEST ABDOMEN PELVIS WO CONTRAST Result Date: 11/09/2023 CLINICAL DATA:  Chest and abdominal pain EXAM: CT CHEST, ABDOMEN AND PELVIS WITHOUT CONTRAST TECHNIQUE: Multidetector CT imaging of the chest, abdomen and pelvis was performed following the standard protocol without IV contrast. RADIATION DOSE REDUCTION: This exam was performed according to the departmental dose-optimization program which includes automated exposure control, adjustment of the mA and/or kV according to patient size and/or use of iterative reconstruction technique. COMPARISON:  06/18/2023 FINDINGS: CT CHEST FINDINGS Cardiovascular: Somewhat limited due to lack  of IV contrast. Atherosclerotic calcifications of the aorta are noted. No aneurysmal dilatation is seen. Prior left atrial clipping is noted. Prior mitral and tricuspid valve surgery is noted. Mediastinum/Nodes: Thoracic inlet is within normal limits. No sizable hilar or mediastinal adenopathy is noted. The esophagus as visualized is within normal limits. Lungs/Pleura: Lungs are well aerated bilaterally. Areas of mosaic attenuation are noted consistent with air trapping. No focal infiltrate or sizable effusion is seen. No parenchymal nodule is noted. Musculoskeletal: Degenerative changes of the thoracic spine are noted. Chronic T11 compression deformity is noted. CT ABDOMEN PELVIS FINDINGS Hepatobiliary: No focal liver abnormality is seen. Status post cholecystectomy. No biliary dilatation. Pancreas: Unremarkable. No pancreatic ductal dilatation or surrounding inflammatory changes. Spleen: Normal in size without focal abnormality. Adrenals/Urinary Tract: Adrenal glands are within normal limits. Kidneys are well visualize without evidence of renal calculi. 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 Bladder is decompressed. Stomach/Bowel: Appendix is within normal limits. No obstructive or inflammatory changes of the colon are seen. Stomach and small bowel are unremarkable. Vascular/Lymphatic: Aortic atherosclerosis. No enlarged abdominal or pelvic lymph nodes. Reproductive: Status post hysterectomy. No adnexal masses. Other: No abdominal wall hernia or abnormality. No abdominopelvic ascites. Musculoskeletal: No acute or significant osseous findings. IMPRESSION: CT of the chest: Changes consistent with air trapping. No acute abnormality is noted. Chronic T2-11 compression fracture. CT of the abdomen and pelvis: No acute abnormality noted. 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 These of been stable over multiple previous exams.  Electronically Signed   By: Alcide Clever M.D.   On: 11/09/2023 20:28   DG Chest 2 View Result Date: 11/09/2023 CLINICAL DATA:  Chest pain.  Cough since Wednesday. EXAM: CHEST - 2 VIEW COMPARISON:  07/03/2023 FINDINGS: Lateral view degraded by patient arm position. Lower thoracic mild compression deformity is of indeterminate acuity. Prior median sternotomy. Mitral valve repair. Left atrial appendage occlusion device. Moderate to marked cardiomegaly. No pneumothorax. No pleural fluid. Suspect mild pulmonary venous congestion without overt congestive failure. Atherosclerosis in the transverse aorta. IMPRESSION: Cardiomegaly with mild pulmonary venous congestion. Aortic Atherosclerosis (ICD10-I70.0). Electronically Signed   By: Jeronimo Greaves M.D.   On: 11/09/2023 18:21    DISCHARGE EXAMINATION: Vitals:   11/13/23 2000 11/13/23 2333 11/14/23 0341 11/14/23 0708  BP: 97/66 103/62 106/60 102/67  Pulse: 75  81 70  Resp: (!) 24  18 18   Temp: 98 F (36.7 C)  97.8 F (36.6 C) 98 F (36.7 C)  TempSrc: Oral  Oral Oral  SpO2: 96%  93% 98%  Weight:      Height:       General appearance: Awake alert.  In no distress Resp: Clear to auscultation bilaterally.  Normal effort Cardio: S1-S2 is normal regular.  No S3-S4.  No rubs murmurs or bruit GI: Abdomen is soft.  Nontender nondistended.  Bowel sounds are present normal.  No masses organomegaly  DISPOSITION: Home  Discharge Instructions     Call MD for:  difficulty breathing, headache or visual disturbances   Complete by: As directed    Call MD for:  extreme fatigue   Complete by: As directed    Call MD for:  persistant dizziness or light-headedness   Complete by: As directed    Call MD for:  persistant nausea and vomiting   Complete by: As directed    Call MD for:  severe uncontrolled pain   Complete by: As directed    Call MD for:  temperature >100.4   Complete by: As directed    Diet - low sodium heart healthy   Complete by: As directed     Diet Carb Modified   Complete by: As directed    Discharge instructions   Complete by: As directed    Please take your medications as prescribed.  Follow-up appointments have been made for you to see the heart doctors.  You were cared for by a hospitalist during your hospital stay. If you have any questions about your discharge medications or the care you received while you were in the hospital after you are discharged, you can call the unit and asked to speak with the hospitalist on call if the hospitalist that took care of you is not available. Once you are discharged, your primary care physician will handle any further medical issues. Please note that NO REFILLS for any discharge medications will be authorized once you are discharged, as it is imperative that you return to your primary care physician (or establish a relationship with a primary care physician if you do not have one) for your aftercare needs so that they can reassess your need for medications and monitor your lab values. If you do not have a primary care physician, you can call [phone] for a physician referral.   Increase activity slowly   Complete by: As directed          Allergies as of 11/14/2023       Reactions   Bee Pollen Anaphylaxis, Other (See Comments)   UNCONFIRMED, but IS allergic to bee VENOM   Bee Venom Anaphylaxis   Sulfa Antibiotics Itching   Chlorhexidine    Iodinated Contrast Media Nausea And Vomiting        Medication List     TAKE these medications    acetaminophen 325 MG tablet Commonly known as: TYLENOL Take 2 tablets (650 mg total) by mouth every 6 (six) hours as needed for mild pain or moderate pain.   albuterol 108 (90 Base) MCG/ACT inhaler Commonly known as: Proventil HFA Inhale 1-2 puffs into the lungs every 6 (six) hours as needed for wheezing or shortness of breath. What changed: how much to take   amiodarone 200 MG tablet Commonly known as: PACERONE Take 2 tablets (400 mg  total) by mouth daily. What changed: how much to take   amoxicillin-clavulanate 500-125 MG tablet Commonly known as: AUGMENTIN Take 1 tablet by mouth 2 (two) times daily for 5 days.   busPIRone 5 MG tablet Commonly known as: BUSPAR TAKE ONE TABLET BY MOUTH TWICE DAILY @  9AM & 5PM   chlorpheniramine-HYDROcodone 10-8 MG/5ML Commonly known as: TUSSIONEX Take 5 mLs by mouth every 12 (twelve) hours as needed for cough.   diphenhydrAMINE 25 MG tablet Commonly known as: BENADRYL Take 25 mg by mouth as needed.   DULoxetine 60 MG capsule Commonly known as: CYMBALTA TAKE ONE CAPSULE (60MG  TOTAL) BY MOUTH DAILY AT 9AM   Eliquis 5 MG Tabs tablet Generic drug: apixaban TAKE ONE TABLET (5MG  TOTAL) BY MOUTH TWICE DAILY @9AM -5PM   EPINEPHrine 0.3 mg/0.3 mL Soaj injection Commonly known as: EPI-PEN Inject 0.3 mg into the muscle as needed for anaphylaxis.   Flonase Allergy Relief 50 MCG/ACT nasal spray Generic drug: fluticasone Place 2 sprays into both nostrils as needed.   insulin lispro 100 UNIT/ML KwikPen Commonly known as: HumaLOG KwikPen For blood sugars 0-150 give 0 units of insulin, 151-200 give 2 units of insulin, 201-250 give 4 units, 251-300 give 6 units, 301-350 give 8 units, 351-400 give 10 units,> 400 give 12 units and call M.D. What changed:  how much to take how to take this when to take this reasons to take this   Lantus SoloStar 100 UNIT/ML Solostar Pen Generic drug: insulin glargine Inject 10 Units into the skin daily.   lidocaine-prilocaine cream Commonly known as: EMLA Apply 1 Application topically Every Tuesday,Thursday,and Saturday with dialysis.   metoCLOPramide 5 MG tablet Commonly known as: Reglan Take 1 tablet (5 mg total) by mouth every 8 (eight) hours as needed for nausea.   metoprolol tartrate 25 MG tablet Commonly known as: LOPRESSOR Take 0.5 tablets (12.5 mg total) by mouth 2 (two) times daily. What changed: when to take this   midodrine 10 MG  tablet Commonly known as: PROAMATINE Take 1 tablet (10 mg total) by mouth See admin instructions. Take 10mg  (1 tablet) by mouth on Tuesday's, Thursday's, and Saturday's.   MiraLax 17 GM/SCOOP powder Generic drug: polyethylene glycol powder Take by mouth as needed.   MULTIVITAMIN WOMEN PO Take 1 tablet by mouth daily.   nitroGLYCERIN 0.4 MG SL tablet Commonly known as: NITROSTAT Place 1 tablet (0.4 mg total) under the tongue every 5 (five) minutes x 3 doses as needed for chest pain.   ondansetron 8 MG tablet Commonly known as: ZOFRAN Take 8 mg by mouth 2 (two) times daily as needed.   pantoprazole 40 MG tablet Commonly known as: PROTONIX Take 1 tablet (40 mg total) by mouth daily.   rosuvastatin 10 MG tablet Commonly known as: CRESTOR TAKE ONE TABLET BY MOUTH DAILY AT 5PM What changed: See the new instructions.          Follow-up Information     Care, [facility] Follow up.   Why: Registered Nurse and Physical Therapy-office to call with visit times. Contact information: [address] [place] [address] [phone]                 TOTAL DISCHARGE TIME: 35 minutes  Tuyen Uncapher Rito Ehrlich  [facility] Pager on [email or web address]  11/15/2023, 10:42 AM
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
## 2023-11-14 NOTE — TOC Transition Note (Signed)
 Transition of Care [facility]) - Discharge Note   Patient Details  Name: [name] MRN: [id number] Date of Birth: 02/23/1956  Transition of Care [facility]) CM/SW Contact:  Gala Lewandowsky, RN Phone Number: 11/14/2023, 10:15 AM   Clinical Narrative: Patient will transition home today- spoke with patient regarding home health needs and she is agreeable to home health RN for disease management and physical therapy. Case Manager discussed the Medicare.gov list and she chose [facility]. Referral submitted to [facility] and PT can open the case; however, RN will be in within 4-5 days per agency since the patient has [facility] Medicare- this will have to be approved by insurance. Patient states she has transportation home. No further needs identified at this time.     Final next level of care: Home w Home Health Services Barriers to Discharge: No Barriers Identified   Patient Goals and CMS Choice Patient states their goals for this hospitalization and ongoing recovery are:: patient plans to return home with daughter.  Discharge Plan and Services Additional resources added to the After Visit Summary for   In-house Referral: NA Discharge Planning Services: CM Consult Post Acute Care Choice: Home Health             HH Arranged: RN, Disease Management, PT HH Agency: [name] Date [facility] Agency Contacted: 11/14/23 Time HH Agency Contacted: [phone] Representative spoke with at [facility] Agency: Elnita Maxwell  Social Drivers of Health (SDOH) Interventions SDOH Screenings   Food Insecurity: No Food Insecurity (11/10/2023)  Housing: Low Risk  (11/10/2023)  Transportation Needs: No Transportation Needs (11/10/2023)  Utilities: Not At Risk (11/10/2023)  Alcohol Screen: Low Risk  (06/04/2023)  Depression (PHQ2-9): High Risk (06/04/2023)  Financial Resource Strain: Low Risk  (06/04/2023)  Physical Activity: Insufficiently Active (06/04/2023)  Social Connections: Moderately Isolated  (11/10/2023)  Stress: No Stress Concern Present (06/04/2023)  Tobacco Use: Low Risk  (11/09/2023)  Health Literacy: Patient Declined (06/04/2023)   Readmission Risk Interventions    11/12/2023    4:32 PM 03/16/2023    3:57 PM 01/20/2022    5:06 PM  Readmission Risk Prevention Plan  Transportation Screening Complete Complete Complete  HRI or Home Care Consult Complete    Social Work Consult for Recovery Care Planning/Counseling Complete    Palliative Care Screening Not Applicable    Medication Review Oceanographer) Referral to Pharmacy Referral to Pharmacy   PCP or Specialist appointment within 3-5 days of discharge  Complete Complete  HRI or Home Care Consult  Complete   SW Recovery Care/Counseling Consult  Complete Complete  Palliative Care Screening  Not Applicable Not Applicable  Skilled Nursing Facility  Not Applicable Not Applicable

## 2023-11-14 NOTE — Progress Notes (Addendum)
 [facility] Progress Note   Subjective:    Seen and examined patient at bedside. Tolerated yesterday's HD with net UF 3L. She reports feeling mildly SOB earlier this morning but she's not in resp distress, remains on RA, and O2 saturations remain stable. Noted discharge order. Next HD 3/27 if she remains inpatient.  Objective Vitals:   11/13/23 2000 11/13/23 2333 11/14/23 0341 11/14/23 0708  BP: 97/66 103/62 106/60 102/67  Pulse: 75  81 70  Resp: (!) 24  18 18   Temp: 98 F (36.7 C)  97.8 F (36.6 C) 98 F (36.7 C)  TempSrc: Oral  Oral Oral  SpO2: 96%  93% 98%  Weight:      Height:       Physical Exam General: Awake, alert, on RA, Obese, NAD Heart: S1 and S2; No murmurs, gallops, or rubs Lungs: Rhonchi/coarse throughout; No rales Abdomen: Large, soft, non-tender Extremities: No LE edema Dialysis Access: L AVF (+) B/T   Filed Weights   11/11/23 1343 11/13/23 0922 11/13/23 1230  Weight: 127.6 kg 132 kg 129 kg    Intake/Output Summary (Last 24 hours) at 11/14/2023 0942 Last data filed at 11/13/2023 1502 Gross per 24 hour  Intake 633.42 ml  Output 3000 ml  Net -2366.58 ml    Additional Objective Labs: Basic Metabolic Panel: Recent Labs  Lab 11/11/23 0400 11/13/23 0421 11/14/23 0443  NA 135 134* 134*  K 4.7 4.1 4.6  CL 94* 94* 94*  CO2 24 22 26   GLUCOSE 137* 99 120*  BUN 38* 38* 31*  CREATININE 9.83* 8.96* 6.93*  CALCIUM 9.8 9.4 9.9  PHOS  --  5.4*  --    Liver Function Tests: Recent Labs  Lab 11/11/23 0400 11/13/23 0421 11/14/23 0443  AST 15  --  13*  ALT 9  --  10  ALKPHOS 64  --  70  BILITOT 0.8  --  0.9  PROT 6.9  --  7.7  ALBUMIN 3.2* 3.3* 3.9   No results for input(s): "LIPASE", "AMYLASE" in the last 168 hours. CBC: Recent Labs  Lab 11/09/23 1634 11/10/23 0620 11/11/23 0400 11/13/23 0421 11/14/23 0443  WBC 8.6 8.8 7.8 7.5 7.6  NEUTROABS  --  5.6  --  4.4  --   HGB 9.4* 8.9* 9.0* 8.1* 8.8*  HCT 29.7* 28.9* 28.0* 25.5*  28.1*  MCV 101.0* 103.2* 99.6 101.2* 100.7*  PLT 164 150 148* 157 171   Blood Culture    Component Value Date/Time   SDES  09/14/2022 0840    BLOOD RIGHT HAND Performed at [facility], [address], [place], [place] [address]    [facility]  09/14/2022 0840    BOTTLES DRAWN AEROBIC AND ANAEROBIC Blood Culture adequate volume Performed at [facility], [address] [place], [place] [address]    CULT  09/14/2022 0840    NO GROWTH 5 DAYS Performed at [facility], [address] [place], [place] [address]    REPTSTATUS 09/19/2022 FINAL 09/14/2022 0840    Cardiac Enzymes: No results for input(s): "CKTOTAL", "CKMB", "CKMBINDEX", "TROPONINI" in the last 168 hours. CBG: Recent Labs  Lab 11/13/23 0724 11/13/23 1249 11/13/23 1604 11/13/23 2146 11/14/23 0758  GLUCAP 104* 72 147* 109* 83   Iron Studies: No results for input(s): "IRON", "TIBC", "TRANSFERRIN", "FERRITIN" in the last 72 hours. Lab Results  Component Value Date   INR 1.9 (H) 06/18/2023   INR 2.0 (H) 06/28/2022   INR 2.3 (H) 11/24/2021  Studies/Results: DG CHEST PORT 1 VIEW Result Date: 11/12/2023 CLINICAL DATA:  Pneumonia EXAM: PORTABLE CHEST 1 VIEW COMPARISON:  11/09/2023 FINDINGS: Stable cardiomegaly. Status post sternotomy and cardiac valve replacement. Aortic atherosclerosis. No focal airspace consolidation, pleural effusion, or pneumothorax. IMPRESSION: Cardiomegaly. No acute cardiopulmonary findings. Electronically Signed   By: Duanne Guess D.O.   On: 11/12/2023 14:23    Medications:  albumin human 60 mL/hr at 11/13/23 1502    amiodarone  400 mg Oral Daily   amoxicillin-clavulanate  1 tablet Oral BID   apixaban  5 mg Oral BID   busPIRone  5 mg Oral BID   DULoxetine  60 mg Oral Daily   heparin sodium (porcine)  3,200 Units Intracatheter Once   insulin aspart  0-15 Units Subcutaneous TID WC   insulin aspart  0-5 Units Subcutaneous QHS   insulin  glargine-yfgn  10 Units Subcutaneous QHS   metoprolol tartrate  12.5 mg Oral BID   midodrine  10 mg Oral Q T,Th,Sa-HD   oxyCODONE  5 mg Oral Once   pantoprazole  40 mg Oral Daily   rosuvastatin  10 mg Oral Daily    Dialysis Orders: SW TTS  4h  400/600   124.4 kg  3K/2.5Ca bath AVF   Heparin 2500 - last OP HD 3/20, usually getting to or close to dry wt - hectorol 3 mcg  - venofer 50 mg qweekly - mircera 150 mcg IV q 2, last on 3/19   Assessment/Plan: URI per primary. ESRD - Next HD 11/15/23. Chronic combined systolic and diastolic CHF  with peripheral patient seen by cardiology. Hypertension/volume  - She reports mild SOB earlier this morning but likely 2nd COPD exacerbated by current URI. Not overloaded on exam, on RA with stable O2 saturations. On Midodrine with HD. A Fib: Cardiology following. On Eliquis. Patient has been cardioverted in the past last time July 23, 2023, on DOAC, prior MAZE. RVR thought to be triggered by URI per cardiology. On PO Amiodarone. HR currently controlled. Anemia  - Hgb 8.8 (not due for ESA), dose on 3/19 Metabolic bone disease -   check a phos for binder management.  In past Vel phoro binder but gi distress , not on binder for now will fu  phos trend as op.  8. Nutrition - renal diet, carb modified. 9  DM type 2 - stable.  Plan per primary 10. Dispo - I think she is okay for discharge from a renal standpoint. She can resume HD tomorrow in outpatient. Need to see if okay for discharge from Primary's standpoint.  Salome Holmes, NP [facility] 11/14/2023,9:42 AM  LOS: 5 days

## 2023-11-15 ENCOUNTER — Telehealth: Payer: Self-pay

## 2023-11-15 ENCOUNTER — Telehealth (INDEPENDENT_AMBULATORY_CARE_PROVIDER_SITE_OTHER): Admitting: Primary Care

## 2023-11-15 DIAGNOSIS — D508 Other iron deficiency anemias: Secondary | ICD-10-CM | POA: Diagnosis not present

## 2023-11-15 DIAGNOSIS — N186 End stage renal disease: Secondary | ICD-10-CM | POA: Diagnosis not present

## 2023-11-15 DIAGNOSIS — Z992 Dependence on renal dialysis: Secondary | ICD-10-CM | POA: Diagnosis not present

## 2023-11-15 DIAGNOSIS — R058 Other specified cough: Secondary | ICD-10-CM | POA: Diagnosis not present

## 2023-11-15 DIAGNOSIS — D631 Anemia in chronic kidney disease: Secondary | ICD-10-CM | POA: Diagnosis not present

## 2023-11-15 DIAGNOSIS — N2581 Secondary hyperparathyroidism of renal origin: Secondary | ICD-10-CM | POA: Diagnosis not present

## 2023-11-15 MED ORDER — GUAIFENESIN 200 MG PO TABS: 200.0000 mg | ORAL_TABLET | ORAL | 0 refills | Status: DC | PRN

## 2023-11-15 MED ORDER — BENZONATATE 100 MG PO CAPS: 200.0000 mg | ORAL_CAPSULE | Freq: Two times a day (BID) | ORAL | 0 refills | Status: DC | PRN

## 2023-11-15 NOTE — Progress Notes (Signed)
 [facility]  Visit via Telephone Note  I connected with [name], on 11/15/2023 at 3:03 PM by telephone and verified that I am speaking with the correct person using two identifiers.   Consent: I discussed the limitations, risks, security and privacy concerns of performing an evaluation and management service by telephone and the availability of in person appointments. I also discussed with the patient that there may be a patient responsible charge related to this service. The patient expressed understanding and agreed to proceed.   Location of Patient: Home  Location of Provider: [facility]   Persons participating in visit: Gildardo Pounds,  NP   History of Present Illness: Ms.[name] is a 68 year old female discharged from the hospital yesterday.  She is having productive cough but difficulty getting the phlegm out.  She went to pick up her prescription for cough which was Tussionex it was $55 and she was not able to afford it.  Explained to patient will send in an expectorant and medication for cough.  Will see her for hospital follow-up after cardiology.   Past Medical History:  Diagnosis Date   Acute on chronic systolic CHF (congestive heart failure) (HCC) 12/21/2020   Allergic rhinitis    Anemia    Arthritis    Asthma    Chronic diastolic CHF (congestive heart failure) (HCC)    Chronic kidney disease    Dialysis Tu/TH/Sa   COPD (chronic obstructive pulmonary disease) (HCC)    COVID-19 03/28/2023   Depression    DM (diabetes mellitus) (HCC)    DVT (deep vein thrombosis) in pregnancy    Gastroenteritis 03/27/2023   GERD (gastroesophageal reflux disease)    HTN (hypertension)    Hyperlipidemia    Obesity    Pneumonia 04/2018   RIGHT LOBE   Sleep apnea    needs to be retested - to get new cpap   Allergies  Allergen Reactions   Bee Pollen  Anaphylaxis and Other (See Comments)    UNCONFIRMED, but IS allergic to bee VENOM   Bee Venom Anaphylaxis   Sulfa Antibiotics Itching   Chlorhexidine    Iodinated Contrast Media Nausea And Vomiting    Current Outpatient Medications on File Prior to Visit  Medication Sig Dispense Refill   acetaminophen (TYLENOL) 325 MG tablet Take 2 tablets (650 mg total) by mouth every 6 (six) hours as needed for mild pain or moderate pain.     albuterol (PROVENTIL HFA) 108 (90 Base) MCG/ACT inhaler Inhale 1-2 puffs into the lungs every 6 (six) hours as needed for wheezing or shortness of breath. (Patient taking differently: Inhale 2 puffs into the lungs every 6 (six) hours as needed for wheezing or shortness of breath.) 18 g 2   amiodarone (PACERONE) 200 MG tablet Take 2 tablets (400 mg total) by mouth daily. 60 tablet 0   amoxicillin-clavulanate (AUGMENTIN) 500-125 MG tablet Take 1 tablet by mouth 2 (two) times daily for 5 days. 10 tablet 0   benzonatate (TESSALON PERLES) 100 MG capsule Take 2 capsules (200 mg total) by mouth 2 (two) times daily as needed for cough. 30 capsule 0   busPIRone (BUSPAR) 5 MG tablet TAKE ONE TABLET BY MOUTH TWICE DAILY @ 9AM & 5PM 180 tablet 0   chlorpheniramine-HYDROcodone (TUSSIONEX) 10-8 MG/5ML Take 5 mLs by mouth every 12 (twelve) hours as needed for cough. 70 mL 0   diphenhydrAMINE (BENADRYL) 25 MG tablet Take 25  mg by mouth as needed.     DULoxetine (CYMBALTA) 60 MG capsule TAKE ONE CAPSULE (60MG  TOTAL) BY MOUTH DAILY AT 9AM 180 capsule 11   ELIQUIS 5 MG TABS tablet TAKE ONE TABLET (5MG  TOTAL) BY MOUTH TWICE DAILY @9AM -5PM 180 tablet 11   EPINEPHrine 0.3 mg/0.3 mL IJ SOAJ injection Inject 0.3 mg into the muscle as needed for anaphylaxis. 1 each 1   fluticasone (FLONASE ALLERGY RELIEF) 50 MCG/ACT nasal spray Place 2 sprays into both nostrils as needed.     guaiFENesin 200 MG tablet Take 1 tablet (200 mg total) by mouth every 4 (four) hours as needed for cough or to loosen  phlegm. 30 suppository 0   insulin glargine (LANTUS SOLOSTAR) 100 UNIT/ML Solostar Pen Inject 10 Units into the skin daily. 15 mL 3   insulin lispro (HUMALOG KWIKPEN) 100 UNIT/ML KwikPen For blood sugars 0-150 give 0 units of insulin, 151-200 give 2 units of insulin, 201-250 give 4 units, 251-300 give 6 units, 301-350 give 8 units, 351-400 give 10 units,> 400 give 12 units and call M.D. (Patient taking differently: Inject 2-12 Units into the skin 2 (two) times daily as needed. For blood sugars 0-150 give 0 units of insulin, 151-200 give 2 units of insulin, 201-250 give 4 units, 251-300 give 6 units, 301-350 give 8 units, 351-400 give 10 units,> 400 give 12 units and call M.D.) 15 mL 2   lidocaine-prilocaine (EMLA) cream Apply 1 Application topically Every Tuesday,Thursday,and Saturday with dialysis.     metoCLOPramide (REGLAN) 5 MG tablet Take 1 tablet (5 mg total) by mouth every 8 (eight) hours as needed for nausea. 30 tablet 0   metoprolol tartrate (LOPRESSOR) 25 MG tablet Take 0.5 tablets (12.5 mg total) by mouth 2 (two) times daily. (Patient taking differently: Take 12.5 mg by mouth daily.) 30 tablet 0   midodrine (PROAMATINE) 10 MG tablet Take 1 tablet (10 mg total) by mouth See admin instructions. Take 10mg  (1 tablet) by mouth on Tuesday's, Thursday's, and Saturday's.     Multiple Vitamins-Minerals (MULTIVITAMIN WOMEN PO) Take 1 tablet by mouth daily.     nitroGLYCERIN (NITROSTAT) 0.4 MG SL tablet Place 1 tablet (0.4 mg total) under the tongue every 5 (five) minutes x 3 doses as needed for chest pain. 25 tablet 12   ondansetron (ZOFRAN) 8 MG tablet Take 8 mg by mouth 2 (two) times daily as needed.     pantoprazole (PROTONIX) 40 MG tablet Take 1 tablet (40 mg total) by mouth daily. 90 tablet 0   polyethylene glycol powder (MIRALAX) 17 GM/SCOOP powder Take by mouth as needed.     rosuvastatin (CRESTOR) 10 MG tablet TAKE ONE TABLET BY MOUTH DAILY AT 5PM (Patient taking differently: Take 10 mg by mouth  daily.) 90 tablet 3   No current facility-administered medications on file prior to visit.    Observations/Objective: HPI   Assessment and Plan: Diagnoses and all orders for this visit:  Cough productive of purulent sputum Tessalon Perles-cough and guaifenesin-expectorant  Follow Up Instructions: As instructed by hospital discharge   I discussed the assessment and treatment plan with the patient. The patient was provided an opportunity to ask questions and all were answered. The patient agreed with the plan and demonstrated an understanding of the instructions.   The patient was advised to call back or seek an in-person evaluation if the symptoms worsen or if the condition fails to improve as anticipated.  I provided  minutes total time during this encounter including  median intraservice time, reviewing previous notes, investigations, ordering medications, medical decision making, coordinating care and patient verbalized understanding at the end of the visit.  This note has been created with Education officer, environmental. Any transcriptional errors are unintentional.   Grayce Sessions, NP 11/15/2023, 3:03 PM

## 2023-11-15 NOTE — TOC Transition Note (Signed)
 Transition of Care - Initial Contact from Inpatient Facility  Date of discharge: 11/14/23 Date of contact: 11/15/23 Method: Phone Spoke to: Patient  Patient contacted to discuss transition of care from recent inpatient hospitalization. Patient was admitted to [facility] from 11/09/23-11/14/23 with discharge diagnosis of Sinusitis, URI, and Afib with RVR.  The discharge medication list was reviewed. Patient understands the changes and has no concerns.   Patient received HD today. Next HD on 11/17/23  Salome Holmes, NP

## 2023-11-15 NOTE — Transitions of Care (Post Inpatient/ED Visit) (Signed)
   11/15/2023  Name: [name] MRN: [id number] DOB: 11-11-1955  Today's TOC FU Call Status: Today's TOC FU Call Status:: Unsuccessful Call (1st Attempt) Unsuccessful Call (1st Attempt) Date: 11/15/23  Attempted to reach the patient regarding the most recent Inpatient/ED visit.  Follow Up Plan: Additional outreach attempts will be made to reach the patient to complete the Transitions of Care (Post Inpatient/ED visit) call.   The patient had a televisit with PCP today.   Signature  Robyne Peers, RN

## 2023-11-15 NOTE — Transitions of Care (Post Inpatient/ED Visit) (Signed)
   11/15/2023  Name: [name] MRN: [id number] DOB: 28-Sep-1955  Today's TOC FU Call Status: Today's TOC FU Call Status:: Unsuccessful Call (1st Attempt) Unsuccessful Call (1st Attempt) Date: 11/15/23  Attempted to reach the patient regarding the most recent Inpatient/ED visit. Briefly spoke with patient who requested call back tomorrow.   Follow Up Plan: Additional outreach attempts will be made to reach the patient to complete the Transitions of Care (Post Inpatient/ED visit) call.   Hilbert Odor RN, CCM [place]  VBCI-Population Health RN Care Manager [phone]

## 2023-11-16 ENCOUNTER — Telehealth: Payer: Self-pay

## 2023-11-16 NOTE — Transitions of Care (Post Inpatient/ED Visit) (Signed)
   11/16/2023  Name: [name] MRN: [id number] DOB: [birth year]  Today's TOC FU Call Status: Today's TOC FU Call Status:: Unsuccessful Call (2nd Attempt) Unsuccessful Call (2nd Attempt) Date: 11/16/23  Attempted to reach the patient regarding the most recent Inpatient/ED visit.  Follow Up Plan: Additional outreach attempts will be made to reach the patient to complete the Transitions of Care (Post Inpatient/ED visit) call.   Hilbert Odor RN, CCM [place]  VBCI-Population Health RN Care Manager [phone]

## 2023-11-17 DIAGNOSIS — Z992 Dependence on renal dialysis: Secondary | ICD-10-CM | POA: Diagnosis not present

## 2023-11-17 DIAGNOSIS — D508 Other iron deficiency anemias: Secondary | ICD-10-CM | POA: Diagnosis not present

## 2023-11-17 DIAGNOSIS — D631 Anemia in chronic kidney disease: Secondary | ICD-10-CM | POA: Diagnosis not present

## 2023-11-17 DIAGNOSIS — N186 End stage renal disease: Secondary | ICD-10-CM | POA: Diagnosis not present

## 2023-11-17 DIAGNOSIS — N2581 Secondary hyperparathyroidism of renal origin: Secondary | ICD-10-CM | POA: Diagnosis not present

## 2023-11-19 ENCOUNTER — Telehealth: Payer: Self-pay

## 2023-11-19 DIAGNOSIS — E1122 Type 2 diabetes mellitus with diabetic chronic kidney disease: Secondary | ICD-10-CM | POA: Diagnosis not present

## 2023-11-19 DIAGNOSIS — N186 End stage renal disease: Secondary | ICD-10-CM | POA: Diagnosis not present

## 2023-11-19 DIAGNOSIS — Z992 Dependence on renal dialysis: Secondary | ICD-10-CM | POA: Diagnosis not present

## 2023-11-19 NOTE — Progress Notes (Signed)
Patient in the hospital. This encounter was created in error - please disregard.

## 2023-11-19 NOTE — Transitions of Care (Post Inpatient/ED Visit) (Signed)
 11/19/2023  Name: [name] MRN: [id number] DOB: 1955/08/28  Today's TOC FU Call Status: Today's TOC FU Call Status:: Successful TOC FU Call Completed TOC FU Call Complete Date: 11/19/23 Patient's Name and Date of Birth confirmed.  Transition Care Management Follow-up Telephone Call Date of Discharge: 11/14/23 Discharge Facility: [facility]) Type of Discharge: Inpatient Admission Primary Inpatient Discharge Diagnosis:: Atrial Fibrillation with RVR How have you been since you were released from the hospital?: Better Any questions or concerns?: No  Items Reviewed: Did you receive and understand the discharge instructions provided?: Yes Medications obtained,verified, and reconciled?: Yes (Medications Reviewed) Any new allergies since your discharge?: No Dietary orders reviewed?: Yes Type of Diet Ordered:: Low sodium, heart healthy, carbohydrate modified Do you have support at home?: Yes Name of Support/Comfort Primary Source: states she does not live alone but did not elaborate further; patient has 2 daughters, a sister and a friend listed as emergency contacts  Medications Reviewed Today: Medications Reviewed Today     Reviewed by Marcos Eke, RN (Registered Nurse) on 11/19/23 at 1358  Med List Status: <None>   Medication Order Taking? Sig Documenting Provider Last Dose Status Informant  acetaminophen (TYLENOL) 325 MG tablet [id number] No Take 2 tablets (650 mg total) by mouth every 6 (six) hours as needed for mild pain or moderate pain. Arrien, York Ram, MD Unknown Active Self, Pharmacy Records           Med Note (LEE, NICOLE   Sat Nov 10, 2023  2:12 AM)    albuterol (PROVENTIL HFA) 108 (90 Base) MCG/ACT inhaler [id number] No Inhale 1-2 puffs into the lungs every 6 (six) hours as needed for wheezing or shortness of breath.  Patient taking differently: Inhale 2 puffs into the lungs every 6 (six) hours as needed for wheezing or shortness of breath.    Grayce Sessions, NP Past Month Active Self, Pharmacy Records           Med Note (LEE, NICOLE   Sat Nov 10, 2023  2:12 AM)    amiodarone (PACERONE) 200 MG tablet [id number]  Take 2 tablets (400 mg total) by mouth daily. Osvaldo Shipper, MD  Active   amoxicillin-clavulanate (AUGMENTIN) 500-125 MG tablet [id number]  Take 1 tablet by mouth 2 (two) times daily for 5 days. Osvaldo Shipper, MD  Active   benzonatate (TESSALON PERLES) 100 MG capsule [id number]  Take 2 capsules (200 mg total) by mouth 2 (two) times daily as needed for cough. Grayce Sessions, NP  Active   busPIRone (BUSPAR) 5 MG tablet [id number] No TAKE ONE TABLET BY MOUTH TWICE DAILY @ 9AM & 5PM Grayce Sessions, NP Past Week Active Self, Pharmacy Records  chlorpheniramine-HYDROcodone (TUSSIONEX) 10-8 MG/5ML [id number]  Take 5 mLs by mouth every 12 (twelve) hours as needed for cough. Osvaldo Shipper, MD  Active   diphenhydrAMINE (BENADRYL) 25 MG tablet [id number] No Take 25 mg by mouth as needed. [provider] Past Month Active Self, Pharmacy Records  DULoxetine (CYMBALTA) 60 MG capsule [id number] No TAKE ONE CAPSULE (60MG  TOTAL) BY MOUTH DAILY AT Delfin Gant, NP Past Week Active Self, Pharmacy Records  ELIQUIS 5 MG TABS tablet [id number] No TAKE ONE TABLET (5MG  TOTAL) BY MOUTH TWICE DAILY @9AM -5PM Bensimhon, Bevelyn Buckles, MD 11/09/2023 12:30 PM Active Self, Pharmacy Records  EPINEPHrine 0.3 mg/0.3 mL IJ SOAJ injection [id number] No Inject 0.3 mg into the muscle as needed for anaphylaxis. Grayce Sessions, NP Unknown Active Self, Pharmacy Records  Med Note Merlene Morse, KIMBERLY L   Tue Jul 03, 2023  1:11 PM) Has on hand  fluticasone [facility] ALLERGY RELIEF) 50 MCG/ACT nasal spray [id number] No Place 2 sprays into both nostrils as needed. [provider] 11/09/2023 Morning Active Self, Pharmacy Records  guaiFENesin 200 MG tablet [id number]  Take 1 tablet (200 mg total) by mouth every 4 (four) hours as needed  for cough or to loosen phlegm. Grayce Sessions, NP  Active   insulin glargine (LANTUS SOLOSTAR) 100 UNIT/ML Solostar Pen [id number] No Inject 10 Units into the skin daily. Grayce Sessions, NP Past Week Active Self, Pharmacy Records  insulin lispro (HUMALOG KWIKPEN) 100 UNIT/ML KwikPen [id number] No For blood sugars 0-150 give 0 units of insulin, 151-200 give 2 units of insulin, 201-250 give 4 units, 251-300 give 6 units, 301-350 give 8 units, 351-400 give 10 units,> 400 give 12 units and call M.D.  Patient taking differently: Inject 2-12 Units into the skin 2 (two) times daily as needed. For blood sugars 0-150 give 0 units of insulin, 151-200 give 2 units of insulin, 201-250 give 4 units, 251-300 give 6 units, 301-350 give 8 units, 351-400 give 10 units,> 400 give 12 units and call M.D.   Hoy Register, MD Past Week Active Self, Pharmacy Records  lidocaine-prilocaine (EMLA) cream [id number] No Apply 1 Application topically Every Tuesday,Thursday,and Saturday with dialysis. [provider] Past Week Active Self, Pharmacy Records  metoCLOPramide (REGLAN) 5 MG tablet [id number] No Take 1 tablet (5 mg total) by mouth every 8 (eight) hours as needed for nausea. Grayce Sessions, NP Past Week Active Self, Pharmacy Records  metoprolol tartrate (LOPRESSOR) 25 MG tablet [id number] No Take 0.5 tablets (12.5 mg total) by mouth 2 (two) times daily.  Patient taking differently: Take 12.5 mg by mouth daily.   Noralee Stain, DO Past Week Active Self, Pharmacy Records  midodrine (PROAMATINE) 10 MG tablet [id number] No Take 1 tablet (10 mg total) by mouth See admin instructions. Take 10mg  (1 tablet) by mouth on Tuesday's, Thursday's, and Saturday's. Noralee Stain, DO Past Week Active Self, Pharmacy Records  Multiple Vitamins-Minerals (MULTIVITAMIN WOMEN PO) [id number] No Take 1 tablet by mouth daily. [provider] Past Week Active Self, Pharmacy Records  nitroGLYCERIN (NITROSTAT) 0.4 MG SL  tablet [id number] No Place 1 tablet (0.4 mg total) under the tongue every 5 (five) minutes x 3 doses as needed for chest pain. Jodelle Red, MD 11/09/2023 Morning Active Self, Pharmacy Records  ondansetron (ZOFRAN) 8 MG tablet [id number] No Take 8 mg by mouth 2 (two) times daily as needed. [provider] Unknown Active Self, Pharmacy Records  pantoprazole (PROTONIX) 40 MG tablet [id number] No Take 1 tablet (40 mg total) by mouth daily. Grayce Sessions, NP Past Week Active Self, Pharmacy Records           Med Note Melvyn Neth, Dot Been Jul 16, 2023 11:56 AM)    polyethylene glycol powder [facility]) 17 GM/SCOOP powder [id number] No Take by mouth as needed. [provider] Unknown Active Self, Pharmacy Records  rosuvastatin (CRESTOR) 10 MG tablet [id number] No TAKE ONE TABLET BY MOUTH DAILY AT 5PM  Patient taking differently: Take 10 mg by mouth daily.   Bensimhon, Bevelyn Buckles, MD 11/09/2023 Morning Active Self, Pharmacy Records  Med List Note (Card, Amy L, CPhT 03/15/23 0000): Dialysis: Tuesday, Thursday, and Saturday.             Home Care and Equipment/Supplies: Were Home Health Services Ordered?: Yes  Name of Home Health Agency:: [facility] Has Agency set up a time to come to your home?: No EMR reviewed for Home Health Orders: Orders present/patient has not received call (refer to CM for follow-up) (This RNCM called [facility] office listed on pt's AVS ([phone], [place]) as patient was ordered [facility] Nurse and PT, left voicemail as required with return call back number.) Any new equipment or medical supplies ordered?: No  Functional Questionnaire: Do you need assistance with bathing/showering or dressing?: Yes (uses a single point cane and rollator, needs a little help with walking and transfers, bathing/dressing/bathroom) Do you need assistance with meal preparation?: Yes (needs assistance with cooking and housework) Do you need assistance with  eating?: No Do you have difficulty maintaining continence: No Do you need assistance with getting out of bed/getting out of a chair/moving?: Yes (uses a single point cane and rollator, needs a little help with walking and transfers) Do you have difficulty managing or taking your medications?: No  Follow up appointments reviewed: PCP Follow-up appointment confirmed?: Yes Date of PCP follow-up appointment?: 11/28/23 Follow-up Provider: PCP is Gwinda Passe, NP Specialist Hospital Follow-up appointment confirmed?: Yes Date of Specialist follow-up appointment?: 11/23/23 Follow-Up Specialty Provider:: 4/4 with Cardiology/Dr. Gala Romney; 5/12 OV Bridgette Christopher; 5/15 OV Lorella Nimrod Do you need transportation to your follow-up appointment?: No Do you understand care options if your condition(s) worsen?: Yes-patient verbalized understanding    Purnell Daigle A. Mliss Fritz RN, BA, [facility], CRRN [place]  [facility] RN Care Manager, Transition of Care [phone]

## 2023-11-20 DIAGNOSIS — Z992 Dependence on renal dialysis: Secondary | ICD-10-CM | POA: Diagnosis not present

## 2023-11-20 DIAGNOSIS — N186 End stage renal disease: Secondary | ICD-10-CM | POA: Diagnosis not present

## 2023-11-20 DIAGNOSIS — D631 Anemia in chronic kidney disease: Secondary | ICD-10-CM | POA: Diagnosis not present

## 2023-11-20 DIAGNOSIS — N2581 Secondary hyperparathyroidism of renal origin: Secondary | ICD-10-CM | POA: Diagnosis not present

## 2023-11-20 DIAGNOSIS — D508 Other iron deficiency anemias: Secondary | ICD-10-CM | POA: Diagnosis not present

## 2023-11-20 DIAGNOSIS — E1129 Type 2 diabetes mellitus with other diabetic kidney complication: Secondary | ICD-10-CM | POA: Diagnosis not present

## 2023-11-21 ENCOUNTER — Telehealth (INDEPENDENT_AMBULATORY_CARE_PROVIDER_SITE_OTHER): Payer: Self-pay | Admitting: Primary Care

## 2023-11-21 NOTE — Telephone Encounter (Signed)
 Called pt to confirm appt. Pt will be present.

## 2023-11-22 DIAGNOSIS — D631 Anemia in chronic kidney disease: Secondary | ICD-10-CM | POA: Diagnosis not present

## 2023-11-22 DIAGNOSIS — N186 End stage renal disease: Secondary | ICD-10-CM | POA: Diagnosis not present

## 2023-11-22 DIAGNOSIS — I4891 Unspecified atrial fibrillation: Secondary | ICD-10-CM | POA: Diagnosis not present

## 2023-11-22 DIAGNOSIS — D508 Other iron deficiency anemias: Secondary | ICD-10-CM | POA: Diagnosis not present

## 2023-11-22 DIAGNOSIS — Z992 Dependence on renal dialysis: Secondary | ICD-10-CM | POA: Diagnosis not present

## 2023-11-22 DIAGNOSIS — E1129 Type 2 diabetes mellitus with other diabetic kidney complication: Secondary | ICD-10-CM | POA: Diagnosis not present

## 2023-11-22 DIAGNOSIS — N2581 Secondary hyperparathyroidism of renal origin: Secondary | ICD-10-CM | POA: Diagnosis not present

## 2023-11-23 ENCOUNTER — Encounter (HOSPITAL_COMMUNITY): Payer: Self-pay | Admitting: Internal Medicine

## 2023-11-23 ENCOUNTER — Ambulatory Visit (HOSPITAL_COMMUNITY)
Admission: RE | Admit: 2023-11-23 | Discharge: 2023-11-23 | Disposition: A | Discharge status: Home / Self Care | Source: Ambulatory Visit | Attending: Internal Medicine | Admitting: Internal Medicine

## 2023-11-23 ENCOUNTER — Inpatient Hospital Stay (INDEPENDENT_AMBULATORY_CARE_PROVIDER_SITE_OTHER): Admitting: Primary Care

## 2023-11-23 ENCOUNTER — Other Ambulatory Visit (HOSPITAL_COMMUNITY): Payer: Self-pay | Admitting: *Deleted

## 2023-11-23 VITALS — BP 104/70 | HR 103 | Wt 277.0 lb

## 2023-11-23 DIAGNOSIS — E1122 Type 2 diabetes mellitus with diabetic chronic kidney disease: Secondary | ICD-10-CM | POA: Diagnosis not present

## 2023-11-23 DIAGNOSIS — Z794 Long term (current) use of insulin: Secondary | ICD-10-CM | POA: Insufficient documentation

## 2023-11-23 DIAGNOSIS — I5082 Biventricular heart failure: Secondary | ICD-10-CM | POA: Insufficient documentation

## 2023-11-23 DIAGNOSIS — G4733 Obstructive sleep apnea (adult) (pediatric): Secondary | ICD-10-CM | POA: Insufficient documentation

## 2023-11-23 DIAGNOSIS — I5022 Chronic systolic (congestive) heart failure: Secondary | ICD-10-CM | POA: Insufficient documentation

## 2023-11-23 DIAGNOSIS — I4819 Other persistent atrial fibrillation: Secondary | ICD-10-CM

## 2023-11-23 DIAGNOSIS — I2510 Atherosclerotic heart disease of native coronary artery without angina pectoris: Secondary | ICD-10-CM | POA: Diagnosis not present

## 2023-11-23 DIAGNOSIS — I132 Hypertensive heart and chronic kidney disease with heart failure and with stage 5 chronic kidney disease, or end stage renal disease: Secondary | ICD-10-CM | POA: Insufficient documentation

## 2023-11-23 DIAGNOSIS — Z9889 Other specified postprocedural states: Secondary | ICD-10-CM

## 2023-11-23 DIAGNOSIS — Z992 Dependence on renal dialysis: Secondary | ICD-10-CM | POA: Insufficient documentation

## 2023-11-23 DIAGNOSIS — I480 Paroxysmal atrial fibrillation: Secondary | ICD-10-CM

## 2023-11-23 DIAGNOSIS — I081 Rheumatic disorders of both mitral and tricuspid valves: Secondary | ICD-10-CM | POA: Insufficient documentation

## 2023-11-23 DIAGNOSIS — Z6841 Body Mass Index (BMI) 40.0 and over, adult: Secondary | ICD-10-CM | POA: Diagnosis not present

## 2023-11-23 DIAGNOSIS — Z79899 Other long term (current) drug therapy: Secondary | ICD-10-CM | POA: Insufficient documentation

## 2023-11-23 DIAGNOSIS — Z7901 Long term (current) use of anticoagulants: Secondary | ICD-10-CM | POA: Diagnosis not present

## 2023-11-23 DIAGNOSIS — I5042 Chronic combined systolic (congestive) and diastolic (congestive) heart failure: Secondary | ICD-10-CM

## 2023-11-23 DIAGNOSIS — N186 End stage renal disease: Secondary | ICD-10-CM | POA: Diagnosis not present

## 2023-11-23 MED ORDER — METOPROLOL TARTRATE 25 MG PO TABS: 12.5000 mg | ORAL_TABLET | Freq: Two times a day (BID) | ORAL | 6 refills | Status: DC

## 2023-11-23 MED ORDER — AMIODARONE HCL 200 MG PO TABS: 200.0000 mg | ORAL_TABLET | Freq: Every day | ORAL | 6 refills | Status: DC

## 2023-11-23 NOTE — Progress Notes (Signed)
 Advanced Heart Failure Clinic Note    PCP: Leslie Sessions, NP PCP-Cardiologist: Leslie Red, MD  [facility]: Dr. Gala Gallagher   Reason for Visit: Heart Failure F/u  HPI: [name] is a 68 y.o. 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    She has a known history of HF with mildly reduced LVEF of 45-50% with cath in 2019 with mild, non-obstructive CAD. Also with history of difficult to control Afib with failed Tikosyn, later switched to amiodarone, dig, metop and apixaban for [facility].    Admitted 5//22 with worsening HF.  LHC 5/19 non-obstructive CAD. RHC 12/22/20 with elevated filling pressures and low CI.    Echo 1/23 EF 45%  Admitted 4/23 with recurrent HF in setting of recurrent AF. Echo 35-40% RV moderately down severe TR. RHC 11/24/21 notable for severely elevated biventricular pressures. Started on milrinone and IV lasix.   Has had multiple admits for AF and HF. Started on HD in 2023.   Echo 2/25 EF 55-60% RV mildly reduced s/p MV repair   Admitted 3/21-26/25 for recurrent AF with RVR in  setting of URI. Seen by EP,. Given IV amio and po amio increased to 400 bid for 1 week. Plan was for outpatient DC-CV.   Here for f/u with her granddaughter. Remains on HD but makes her sick but is able to keep fluid off. Does not tolerate AF well and says she feels weak and tired, She is taking Eliquis without missing a dose. No bleeding   Review of systems complete and found to be negative unless listed in HPI.     ROS: All systems negative except as listed in HPI, PMH and Problem List.  SH:  Social History   Socioeconomic History   Marital status: Widowed    Spouse name: Not on file   Number of children: 2   Years of education: Not on file   Highest education level: Some college, no degree  Occupational History   Not on file  Tobacco Use   Smoking  status: Never    Passive exposure: Never   Smokeless tobacco: Never   Tobacco comments:    Never smoke 10/20/22  Vaping Use   Vaping status: Never Used  Substance and Sexual Activity   Alcohol use: No   Drug use: No   Sexual activity: Not Currently  Other Topics Concern   Not on file  Social History Narrative   Pt lives in [place] with spouse.  2 grown children.   Previously worked in [employer] at [employer].  Now on disability   Social Drivers of Health   Financial Resource Strain: Low Risk  (11/19/2023)   Overall Financial Resource Strain (CARDIA)    Difficulty of Paying Living Expenses: Not hard at all  Food Insecurity: No Food Insecurity (11/19/2023)   Hunger Vital Sign    Worried About Running Out of Food in the Last Year: Never true    Ran Out of Food in the Last Year: Never true  Transportation Needs: No Transportation Needs (11/19/2023)   PRAPARE - Administrator, Civil Service (Medical): No    Lack of Transportation (Non-Medical): No  Physical Activity: Insufficiently Active (06/04/2023)   Exercise Vital Sign    Days of Exercise per Week: 2 days    Minutes of Exercise per Session: 20 min  Stress: No Stress Concern Present (06/04/2023)  [employer] of Occupational Health - Occupational Stress Questionnaire    Feeling of Stress : Only a little  Social Connections: Moderately Isolated (11/10/2023)   Social Connection and Isolation Panel [NHANES]    Frequency of Communication with Friends and Family: More than three times a week    Frequency of Social Gatherings with Friends and Family: More than three times a week    Attends Religious Services: More than 4 times per year    Active Member of [employer] or Organizations: No    Attends Banker Meetings: Never    Marital Status: Widowed  Intimate Partner Violence: Not At Risk (11/10/2023)   Humiliation, Afraid, Rape, and Kick questionnaire    Fear of Current or Ex-Partner: No    Emotionally  Abused: No    Physically Abused: No    Sexually Abused: No   FH:  Family History  Problem Relation Age of Onset   Breast cancer Mother    Cancer Mother    Hypertension Mother    Cancer Father    Hypertension Sister    Hypertension Brother    CAD Other    Past Medical History:  Diagnosis Date   Acute on chronic systolic CHF (congestive heart failure) (HCC) 12/21/2020   Allergic rhinitis    Anemia    Arthritis    Asthma    Chronic diastolic CHF (congestive heart failure) (HCC)    Chronic kidney disease    Dialysis Tu/TH/Sa   COPD (chronic obstructive pulmonary disease) (HCC)    COVID-19 03/28/2023   Depression    DM (diabetes mellitus) (HCC)    DVT (deep vein thrombosis) in pregnancy    Gastroenteritis 03/27/2023   GERD (gastroesophageal reflux disease)    HTN (hypertension)    Hyperlipidemia    Obesity    Pneumonia 04/2018   RIGHT LOBE   Sleep apnea    needs to be retested - to get new cpap   Current Outpatient Medications  Medication Sig Dispense Refill   acetaminophen (TYLENOL) 325 MG tablet Take 2 tablets (650 mg total) by mouth every 6 (six) hours as needed for mild pain or moderate pain.     albuterol (PROVENTIL HFA) 108 (90 Base) MCG/ACT inhaler Inhale 1-2 puffs into the lungs every 6 (six) hours as needed for wheezing or shortness of breath. 18 g 2   amiodarone (PACERONE) 200 MG tablet Take 2 tablets (400 mg total) by mouth daily. 60 tablet 0   busPIRone (BUSPAR) 5 MG tablet TAKE ONE TABLET BY MOUTH TWICE DAILY @ 9AM & 5PM 180 tablet 0   chlorpheniramine-HYDROcodone (TUSSIONEX) 10-8 MG/5ML Take 5 mLs by mouth every 12 (twelve) hours as needed for cough. 70 mL 0   diphenhydrAMINE (BENADRYL) 25 MG tablet Take 25 mg by mouth as needed.     DULoxetine (CYMBALTA) 60 MG capsule TAKE ONE CAPSULE (60MG  TOTAL) BY MOUTH DAILY AT 9AM 180 capsule 11   ELIQUIS 5 MG TABS tablet TAKE ONE TABLET (5MG  TOTAL) BY MOUTH TWICE DAILY @9AM -5PM 180 tablet 11   EPINEPHrine 0.3 mg/0.3  mL IJ SOAJ injection Inject 0.3 mg into the muscle as needed for anaphylaxis. 1 each 1   fluticasone (FLONASE ALLERGY RELIEF) 50 MCG/ACT nasal spray Place 2 sprays into both nostrils as needed.     insulin glargine (LANTUS SOLOSTAR) 100 UNIT/ML Solostar Pen Inject 10 Units into the skin daily. 15 mL 3   insulin lispro (HUMALOG KWIKPEN) 100 UNIT/ML KwikPen For blood sugars 0-150 give 0 units  of insulin, 151-200 give 2 units of insulin, 201-250 give 4 units, 251-300 give 6 units, 301-350 give 8 units, 351-400 give 10 units,> 400 give 12 units and call M.D. 15 mL 2   lidocaine-prilocaine (EMLA) cream Apply 1 Application topically Every Tuesday,Thursday,and Saturday with dialysis.     metoCLOPramide (REGLAN) 5 MG tablet Take 1 tablet (5 mg total) by mouth every 8 (eight) hours as needed for nausea. 30 tablet 0   metoprolol tartrate (LOPRESSOR) 25 MG tablet Take 0.5 tablets (12.5 mg total) by mouth 2 (two) times daily. 30 tablet 0   midodrine (PROAMATINE) 10 MG tablet Take 1 tablet (10 mg total) by mouth See admin instructions. Take 10mg  (1 tablet) by mouth on Tuesday's, Thursday's, and Saturday's.     Multiple Vitamins-Minerals (MULTIVITAMIN WOMEN PO) Take 1 tablet by mouth daily.     nitroGLYCERIN (NITROSTAT) 0.4 MG SL tablet Place 1 tablet (0.4 mg total) under the tongue every 5 (five) minutes x 3 doses as needed for chest pain. 25 tablet 12   ondansetron (ZOFRAN) 8 MG tablet Take 8 mg by mouth 2 (two) times daily as needed.     pantoprazole (PROTONIX) 40 MG tablet Take 1 tablet (40 mg total) by mouth daily. 90 tablet 0   polyethylene glycol powder (MIRALAX) 17 GM/SCOOP powder Take by mouth as needed.     rosuvastatin (CRESTOR) 10 MG tablet TAKE ONE TABLET BY MOUTH DAILY AT 5PM 90 tablet 3   benzonatate (TESSALON PERLES) 100 MG capsule Take 2 capsules (200 mg total) by mouth 2 (two) times daily as needed for cough. 30 capsule 0   No current facility-administered medications for this encounter.    Vitals:   11/23/23 1020  BP: 104/70  Pulse: (!) 103  SpO2: 98%  Weight: 125.6 kg (277 lb)    Wt Readings from Last 3 Encounters:  11/23/23 125.6 kg (277 lb)  11/13/23 129 kg (284 lb 6.3 oz)  09/21/23 122.9 kg (271 lb)     PHYSICAL EXAM: General:  Obese woman in chair No resp difficulty HEENT: normal Neck: supple. no JVP 8-9. Carotids 2+ bilat; no bruits. No lymphadenopathy or thryomegaly appreciated. Cor. Irregular rate & rhythm. 2/6 TR Lungs: clear Abdomen: obese soft, nontender, nondistended. No hepatosplenomegaly. No bruits or masses. Good bowel sounds. Extremities: no cyanosis, clubbing, rash, edema  + LUE AVF with thrill Neuro: alert & orientedx3, cranial nerves grossly intact. moves all 4 extremities w/o difficulty. Affect pleasant   EKG:AF 104 Personally reviewed   ASSESSMENT & PLAN:   1. 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 Moderate MS mean grad 8mm HG - Echo 2/25 EF 55-60% RV mildly reduced s/p MV repair  - Multiple admissions for HF mostly occur in setting of recurrent AF - Chronic NYHA III - Volume status looks good on HD - GDMT limited by ESRD - Will plan repeat DC-CV as below  2. Persistent AF - She has failed Tikosyn. - She had Maze in 5/22 and DCCV in 6/22 & 04/19/21, last of which she had ERAF. - Multiple admissions recently for HF mostly occur in setting of recurrent AF - Now with breakthrough AF on amio - Has seen EP felt not to be good ablation candidate - Amio reloaded.  - Will decrease amio today to 200 daily - Plan repeat DC-CV. We discussed procedure and she is agreeable  3. ESRD - Per Renal  4. CAD - Cath (2022): Mild nonobstructive CAD -  No s/s angina - Continue statin + beta blocker - no ASA w/ Eliquis    5. Valvular Heart Disease: - S/p MV and TV repair in 5/22. ' - Echo 2/25 EF 5560% s/p MV/TV repair no MR. Mean MV  gradient 8 Moderate TR -  6. Obesity - Body mass index is 43.38 kg/m. - Consider GLP1RA   7. DMII - Per primary  8. OSA - follows with Dr. Mayford Knife   I spent a total of 46 minutes today: 1) reviewing the patient's medical records including previous charts, labs and recent notes from other providers; 2) examining the patient and counseling them on their medical issues/explaining the plan of care; 3) adjusting meds as needed and 4) ordering lab work or other needed tests.    Arvilla Meres MD 11:01 AM

## 2023-11-23 NOTE — H&P (View-Only) (Signed)
 Advanced Heart Failure Clinic Note    PCP: Grayce Sessions, NP PCP-Cardiologist: Jodelle Red, MD  [facility]: Dr. Gala Romney   Reason for Visit: Heart Failure F/u  HPI: [name] is a 68 y.o. 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    She has a known history of HF with mildly reduced LVEF of 45-50% with cath in 2019 with mild, non-obstructive CAD. Also with history of difficult to control Afib with failed Tikosyn, later switched to amiodarone, dig, metop and apixaban for [facility].    Admitted 5//22 with worsening HF.  LHC 5/19 non-obstructive CAD. RHC 12/22/20 with elevated filling pressures and low CI.    Echo 1/23 EF 45%  Admitted 4/23 with recurrent HF in setting of recurrent AF. Echo 35-40% RV moderately down severe TR. RHC 11/24/21 notable for severely elevated biventricular pressures. Started on milrinone and IV lasix.   Has had multiple admits for AF and HF. Started on HD in 2023.   Echo 2/25 EF 55-60% RV mildly reduced s/p MV repair   Admitted 3/21-26/25 for recurrent AF with RVR in  setting of URI. Seen by EP,. Given IV amio and po amio increased to 400 bid for 1 week. Plan was for outpatient DC-CV.   Here for f/u with her granddaughter. Remains on HD but makes her sick but is able to keep fluid off. Does not tolerate AF well and says she feels weak and tired, She is taking Eliquis without missing a dose. No bleeding   Review of systems complete and found to be negative unless listed in HPI.     ROS: All systems negative except as listed in HPI, PMH and Problem List.  SH:  Social History   Socioeconomic History   Marital status: Widowed    Spouse name: Not on file   Number of children: 2   Years of education: Not on file   Highest education level: Some college, no degree  Occupational History   Not on file  Tobacco Use   Smoking  status: Never    Passive exposure: Never   Smokeless tobacco: Never   Tobacco comments:    Never smoke 10/20/22  Vaping Use   Vaping status: Never Used  Substance and Sexual Activity   Alcohol use: No   Drug use: No   Sexual activity: Not Currently  Other Topics Concern   Not on file  Social History Narrative   Pt lives in [place] with spouse.  2 grown children.   Previously worked in [employer] at [employer].  Now on disability   Social Drivers of Health   Financial Resource Strain: Low Risk  (11/19/2023)   Overall Financial Resource Strain (CARDIA)    Difficulty of Paying Living Expenses: Not hard at all  Food Insecurity: No Food Insecurity (11/19/2023)   Hunger Vital Sign    Worried About Running Out of Food in the Last Year: Never true    Ran Out of Food in the Last Year: Never true  Transportation Needs: No Transportation Needs (11/19/2023)   PRAPARE - Administrator, Civil Service (Medical): No    Lack of Transportation (Non-Medical): No  Physical Activity: Insufficiently Active (06/04/2023)   Exercise Vital Sign    Days of Exercise per Week: 2 days    Minutes of Exercise per Session: 20 min  Stress: No Stress Concern Present (06/04/2023)  [employer] of Occupational Health - Occupational Stress Questionnaire    Feeling of Stress : Only a little  Social Connections: Moderately Isolated (11/10/2023)   Social Connection and Isolation Panel [NHANES]    Frequency of Communication with Friends and Family: More than three times a week    Frequency of Social Gatherings with Friends and Family: More than three times a week    Attends Religious Services: More than 4 times per year    Active Member of [employer] or Organizations: No    Attends Banker Meetings: Never    Marital Status: Widowed  Intimate Partner Violence: Not At Risk (11/10/2023)   Humiliation, Afraid, Rape, and Kick questionnaire    Fear of Current or Ex-Partner: No    Emotionally  Abused: No    Physically Abused: No    Sexually Abused: No   FH:  Family History  Problem Relation Age of Onset   Breast cancer Mother    Cancer Mother    Hypertension Mother    Cancer Father    Hypertension Sister    Hypertension Brother    CAD Other    Past Medical History:  Diagnosis Date   Acute on chronic systolic CHF (congestive heart failure) (HCC) 12/21/2020   Allergic rhinitis    Anemia    Arthritis    Asthma    Chronic diastolic CHF (congestive heart failure) (HCC)    Chronic kidney disease    Dialysis Tu/TH/Sa   COPD (chronic obstructive pulmonary disease) (HCC)    COVID-19 03/28/2023   Depression    DM (diabetes mellitus) (HCC)    DVT (deep vein thrombosis) in pregnancy    Gastroenteritis 03/27/2023   GERD (gastroesophageal reflux disease)    HTN (hypertension)    Hyperlipidemia    Obesity    Pneumonia 04/2018   RIGHT LOBE   Sleep apnea    needs to be retested - to get new cpap   Current Outpatient Medications  Medication Sig Dispense Refill   acetaminophen (TYLENOL) 325 MG tablet Take 2 tablets (650 mg total) by mouth every 6 (six) hours as needed for mild pain or moderate pain.     albuterol (PROVENTIL HFA) 108 (90 Base) MCG/ACT inhaler Inhale 1-2 puffs into the lungs every 6 (six) hours as needed for wheezing or shortness of breath. 18 g 2   amiodarone (PACERONE) 200 MG tablet Take 2 tablets (400 mg total) by mouth daily. 60 tablet 0   busPIRone (BUSPAR) 5 MG tablet TAKE ONE TABLET BY MOUTH TWICE DAILY @ 9AM & 5PM 180 tablet 0   chlorpheniramine-HYDROcodone (TUSSIONEX) 10-8 MG/5ML Take 5 mLs by mouth every 12 (twelve) hours as needed for cough. 70 mL 0   diphenhydrAMINE (BENADRYL) 25 MG tablet Take 25 mg by mouth as needed.     DULoxetine (CYMBALTA) 60 MG capsule TAKE ONE CAPSULE (60MG  TOTAL) BY MOUTH DAILY AT 9AM 180 capsule 11   ELIQUIS 5 MG TABS tablet TAKE ONE TABLET (5MG  TOTAL) BY MOUTH TWICE DAILY @9AM -5PM 180 tablet 11   EPINEPHrine 0.3 mg/0.3  mL IJ SOAJ injection Inject 0.3 mg into the muscle as needed for anaphylaxis. 1 each 1   fluticasone (FLONASE ALLERGY RELIEF) 50 MCG/ACT nasal spray Place 2 sprays into both nostrils as needed.     insulin glargine (LANTUS SOLOSTAR) 100 UNIT/ML Solostar Pen Inject 10 Units into the skin daily. 15 mL 3   insulin lispro (HUMALOG KWIKPEN) 100 UNIT/ML KwikPen For blood sugars 0-150 give 0 units  of insulin, 151-200 give 2 units of insulin, 201-250 give 4 units, 251-300 give 6 units, 301-350 give 8 units, 351-400 give 10 units,> 400 give 12 units and call M.D. 15 mL 2   lidocaine-prilocaine (EMLA) cream Apply 1 Application topically Every Tuesday,Thursday,and Saturday with dialysis.     metoCLOPramide (REGLAN) 5 MG tablet Take 1 tablet (5 mg total) by mouth every 8 (eight) hours as needed for nausea. 30 tablet 0   metoprolol tartrate (LOPRESSOR) 25 MG tablet Take 0.5 tablets (12.5 mg total) by mouth 2 (two) times daily. 30 tablet 0   midodrine (PROAMATINE) 10 MG tablet Take 1 tablet (10 mg total) by mouth See admin instructions. Take 10mg  (1 tablet) by mouth on Tuesday's, Thursday's, and Saturday's.     Multiple Vitamins-Minerals (MULTIVITAMIN WOMEN PO) Take 1 tablet by mouth daily.     nitroGLYCERIN (NITROSTAT) 0.4 MG SL tablet Place 1 tablet (0.4 mg total) under the tongue every 5 (five) minutes x 3 doses as needed for chest pain. 25 tablet 12   ondansetron (ZOFRAN) 8 MG tablet Take 8 mg by mouth 2 (two) times daily as needed.     pantoprazole (PROTONIX) 40 MG tablet Take 1 tablet (40 mg total) by mouth daily. 90 tablet 0   polyethylene glycol powder (MIRALAX) 17 GM/SCOOP powder Take by mouth as needed.     rosuvastatin (CRESTOR) 10 MG tablet TAKE ONE TABLET BY MOUTH DAILY AT 5PM 90 tablet 3   benzonatate (TESSALON PERLES) 100 MG capsule Take 2 capsules (200 mg total) by mouth 2 (two) times daily as needed for cough. 30 capsule 0   No current facility-administered medications for this encounter.    Vitals:   11/23/23 1020  BP: 104/70  Pulse: (!) 103  SpO2: 98%  Weight: 125.6 kg (277 lb)    Wt Readings from Last 3 Encounters:  11/23/23 125.6 kg (277 lb)  11/13/23 129 kg (284 lb 6.3 oz)  09/21/23 122.9 kg (271 lb)     PHYSICAL EXAM: General:  Obese woman in chair No resp difficulty HEENT: normal Neck: supple. no JVP 8-9. Carotids 2+ bilat; no bruits. No lymphadenopathy or thryomegaly appreciated. Cor. Irregular rate & rhythm. 2/6 TR Lungs: clear Abdomen: obese soft, nontender, nondistended. No hepatosplenomegaly. No bruits or masses. Good bowel sounds. Extremities: no cyanosis, clubbing, rash, edema  + LUE AVF with thrill Neuro: alert & orientedx3, cranial nerves grossly intact. moves all 4 extremities w/o difficulty. Affect pleasant   EKG:AF 104 Personally reviewed   ASSESSMENT & PLAN:   1. 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 Moderate MS mean grad 8mm HG - Echo 2/25 EF 55-60% RV mildly reduced s/p MV repair  - Multiple admissions for HF mostly occur in setting of recurrent AF - Chronic NYHA III - Volume status looks good on HD - GDMT limited by ESRD - Will plan repeat DC-CV as below  2. Persistent AF - She has failed Tikosyn. - She had Maze in 5/22 and DCCV in 6/22 & 04/19/21, last of which she had ERAF. - Multiple admissions recently for HF mostly occur in setting of recurrent AF - Now with breakthrough AF on amio - Has seen EP felt not to be good ablation candidate - Amio reloaded.  - Will decrease amio today to 200 daily - Plan repeat DC-CV. We discussed procedure and she is agreeable  3. ESRD - Per Renal  4. CAD - Cath (2022): Mild nonobstructive CAD -  No s/s angina - Continue statin + beta blocker - no ASA w/ Eliquis    5. Valvular Heart Disease: - S/p MV and TV repair in 5/22. ' - Echo 2/25 EF 5560% s/p MV/TV repair no MR. Mean MV  gradient 8 Moderate TR -  6. Obesity - Body mass index is 43.38 kg/m. - Consider GLP1RA   7. DMII - Per primary  8. OSA - follows with Dr. Mayford Knife   I spent a total of 46 minutes today: 1) reviewing the patient's medical records including previous charts, labs and recent notes from other providers; 2) examining the patient and counseling them on their medical issues/explaining the plan of care; 3) adjusting meds as needed and 4) ordering lab work or other needed tests.    Arvilla Meres MD 11:01 AM

## 2023-11-23 NOTE — Patient Instructions (Signed)
 DECREASE Amiodarone to 200 mg Daily  CARDIOVERSION INSTRUCTIONS:    You are scheduled for a Cardioversion on Wednesday, April 16 with Dr. Gala Romney.    Please arrive at the [facility] (Main Entrance A) at [facility]: [address] [place], [place] [address] at 8:00 AM (This time is 1 hour(s) before your procedure to ensure your preparation).   Free valet parking service is available. You will check in at ADMITTING.   *Please Note: You will receive a call the day before your procedure to confirm the appointment time. That time may have changed from the original time based on the schedule for that day.*    DIET:  Nothing to eat or drink after midnight except a sip of water with medications (see medication instructions below)  MEDICATION INSTRUCTIONS: !!IF ANY NEW MEDICATIONS ARE STARTED AFTER TODAY, PLEASE NOTIFY YOUR PROVIDER AS SOON AS POSSIBLE!!  FYI: Medications such as Semaglutide (Ozempic, [place]), Tirzepatide (Mounjaro, Zepbound), Dulaglutide (Trulicity), etc ("GLP1 agonists") AND Canagliflozin (Invokana), Dapagliflozin (Farxiga), Empagliflozin (Jardiance), Ertugliflozin (Steglatro), Bexagliflozin [employer]) or any combination with one of these drugs such as Invokamet (Canagliflozin/Metformin), Synjardy (Empagliflozin/Metformin), etc ("SGLT2 inhibitors") must be held around the time of a procedure. This is not a comprehensive list of all of these drugs. Please review all of your medications and talk to your provider if you take any one of these. If you are not sure, ask your provider.     Continue taking your anticoagulant (blood thinner): Apixaban (Eliquis).   -Tuesday 4/15 PM ONLY TAKE 5 UNITS OF LANTUS INSULIN -Wednesday 4/16 AM DO NOT TAKE: Insulin  FYI:  For your safety, and to allow [place] to monitor your vital signs accurately during the surgery/procedure we request: If you have artificial nails, gel coating, SNS etc, please have those removed prior to your  surgery/procedure. Not having the nail coverings /polish removed may result in cancellation or delay of your surgery/procedure.  Your support person will be asked to wait in the waiting room during your procedure.  It is OK to have someone drop you off and come back when you are ready to be discharged.  You cannot drive after the procedure and will need someone to drive you home.  Bring your insurance cards.  *Special Note: Every effort is made to have your procedure done on time. Occasionally there are emergencies that occur at the hospital that may cause delays. Please be patient if a delay does occur.     Your physician recommends that you schedule a follow-up appointment in: 6 months   If you have any questions or concerns before your next appointment please send [place] a message through [place] or call our office at [phone].    TO LEAVE A MESSAGE FOR THE NURSE SELECT OPTION 2, PLEASE LEAVE A MESSAGE INCLUDING: YOUR NAME DATE OF BIRTH CALL BACK NUMBER REASON FOR CALL**this is important as we prioritize the call backs  YOU WILL RECEIVE A CALL BACK THE SAME DAY AS LONG AS YOU CALL BEFORE 4:00 PM  At the [facility], you and your health needs are our priority. As part of our continuing mission to provide you with exceptional heart care, we have created designated Provider Care Teams. These Care Teams include your primary Cardiologist (physician) and Advanced Practice Providers (APPs- Physician Assistants and Nurse Practitioners) who all work together to provide you with the care you need, when you need it.   You may see any of the following providers on your designated  Care Team at your next follow up: Dr Arvilla Meres Dr Marca Ancona Dr. Dorthula Nettles Dr. Clearnce Hasten Amy Filbert Schilder, NP Robbie Lis, [place] [facility] [place], [place] Brynda Peon, NP [place] Lee, NP Clarisa Kindred, NP Karle Plumber, PharmD Enos Fling, PharmD   Please be sure to  bring in all your medications bottles to every appointment.    Thank you for choosing [facility]

## 2023-11-24 DIAGNOSIS — Z992 Dependence on renal dialysis: Secondary | ICD-10-CM | POA: Diagnosis not present

## 2023-11-24 DIAGNOSIS — D631 Anemia in chronic kidney disease: Secondary | ICD-10-CM | POA: Diagnosis not present

## 2023-11-24 DIAGNOSIS — E1129 Type 2 diabetes mellitus with other diabetic kidney complication: Secondary | ICD-10-CM | POA: Diagnosis not present

## 2023-11-24 DIAGNOSIS — N186 End stage renal disease: Secondary | ICD-10-CM | POA: Diagnosis not present

## 2023-11-24 DIAGNOSIS — N2581 Secondary hyperparathyroidism of renal origin: Secondary | ICD-10-CM | POA: Diagnosis not present

## 2023-11-24 DIAGNOSIS — D508 Other iron deficiency anemias: Secondary | ICD-10-CM | POA: Diagnosis not present

## 2023-11-27 DIAGNOSIS — D631 Anemia in chronic kidney disease: Secondary | ICD-10-CM | POA: Diagnosis not present

## 2023-11-27 DIAGNOSIS — E1129 Type 2 diabetes mellitus with other diabetic kidney complication: Secondary | ICD-10-CM | POA: Diagnosis not present

## 2023-11-27 DIAGNOSIS — N2581 Secondary hyperparathyroidism of renal origin: Secondary | ICD-10-CM | POA: Diagnosis not present

## 2023-11-27 DIAGNOSIS — Z992 Dependence on renal dialysis: Secondary | ICD-10-CM | POA: Diagnosis not present

## 2023-11-27 DIAGNOSIS — D508 Other iron deficiency anemias: Secondary | ICD-10-CM | POA: Diagnosis not present

## 2023-11-27 DIAGNOSIS — N186 End stage renal disease: Secondary | ICD-10-CM | POA: Diagnosis not present

## 2023-11-28 ENCOUNTER — Encounter (INDEPENDENT_AMBULATORY_CARE_PROVIDER_SITE_OTHER): Payer: Self-pay | Admitting: Primary Care

## 2023-11-28 ENCOUNTER — Ambulatory Visit (INDEPENDENT_AMBULATORY_CARE_PROVIDER_SITE_OTHER): Admitting: Primary Care

## 2023-11-28 VITALS — BP 113/70 | HR 98 | Resp 16 | Ht 67.0 in | Wt 277.4 lb

## 2023-11-28 DIAGNOSIS — F411 Generalized anxiety disorder: Secondary | ICD-10-CM

## 2023-11-28 DIAGNOSIS — R058 Other specified cough: Secondary | ICD-10-CM | POA: Diagnosis not present

## 2023-11-28 MED ORDER — BUSPIRONE HCL 5 MG PO TABS: 5.0000 mg | ORAL_TABLET | Freq: Two times a day (BID) | ORAL | 1 refills | Status: DC

## 2023-11-28 NOTE — Patient Instructions (Signed)
 Fall Prevention in the Home, Adult Falls can cause injuries and can happen to people of all ages. There are many things you can do to make your home safer and to help prevent falls. What actions can I take to prevent falls? General information Use good lighting in all rooms. Make sure to: Replace any light bulbs that burn out. Turn on the lights in dark areas and use night-lights. Keep items that you use often in easy-to-reach places. Lower the shelves around your home if needed. Move furniture so that there are clear paths around it. Do not use throw rugs or other things on the floor that can make you trip. If any of your floors are uneven, fix them. Add color or contrast paint or tape to clearly mark and help you see: Grab bars or handrails. First and last steps of staircases. Where the edge of each step is. If you use a ladder or stepladder: Make sure that it is fully opened. Do not climb a closed ladder. Make sure the sides of the ladder are locked in place. Have someone hold the ladder while you use it. Know where your pets are as you move through your home. What can I do in the bathroom?     Keep the floor dry. Clean up any water on the floor right away. Remove soap buildup in the bathtub or shower. Buildup makes bathtubs and showers slippery. Use non-skid mats or decals on the floor of the bathtub or shower. Attach bath mats securely with double-sided, non-slip rug tape. If you need to sit down in the shower, use a non-slip stool. Install grab bars by the toilet and in the bathtub and shower. Do not use towel bars as grab bars. What can I do in the bedroom? Make sure that you have a light by your bed that is easy to reach. Do not use any sheets or blankets on your bed that hang to the floor. Have a firm chair or bench with side arms that you can use for support when you get dressed. What can I do in the kitchen? Clean up any spills right away. If you need to reach something  above you, use a step stool with a grab bar. Keep electrical cords out of the way. Do not use floor polish or wax that makes floors slippery. What can I do with my stairs? Do not leave anything on the stairs. Make sure that you have a light switch at the top and the bottom of the stairs. Make sure that there are handrails on both sides of the stairs. Fix handrails that are broken or loose. Install non-slip stair treads on all your stairs if they do not have carpet. Avoid having throw rugs at the top or bottom of the stairs. Choose a carpet that does not hide the edge of the steps on the stairs. Make sure that the carpet is firmly attached to the stairs. Fix carpet that is loose or worn. What can I do on the outside of my home? Use bright outdoor lighting. Fix the edges of walkways and driveways and fix any cracks. Clear paths of anything that can make you trip, such as tools or rocks. Add color or contrast paint or tape to clearly mark and help you see anything that might make you trip as you walk through a door, such as a raised step or threshold. Trim any bushes or trees on paths to your home. Check to see if handrails are loose  or broken and that both sides of all steps have handrails. Install guardrails along the edges of any raised decks and porches. Have leaves, snow, or ice cleared regularly. Use sand, salt, or ice melter on paths if you live where there is ice and snow during the winter. Clean up any spills in your garage right away. This includes grease or oil spills. What other actions can I take? Review your medicines with your doctor. Some medicines can cause dizziness or changes in blood pressure, which increase your risk of falling. Wear shoes that: Have a low heel. Do not wear high heels. Have rubber bottoms and are closed at the toe. Feel good on your feet and fit well. Use tools that help you move around if needed. These include: Canes. Walkers. Scooters. Crutches. Ask  your doctor what else you can do to help prevent falls. This may include seeing a physical therapist to learn to do exercises to move better and get stronger. Where to find more information Centers for Disease Control and Prevention, STEADI: TonerPromos.no [employer] on Aging: BaseRingTones.pl National Institute on Aging: BaseRingTones.pl Contact a doctor if: You are afraid of falling at home. You feel weak, drowsy, or dizzy at home. You fall at home. Get help right away if you: Lose consciousness or have trouble moving after a fall. Have a fall that causes a head injury. These symptoms may be an emergency. Get help right away. Call 911. Do not wait to see if the symptoms will go away. Do not drive yourself to the hospital. This information is not intended to replace advice given to you by your health care provider. Make sure you discuss any questions you have with your health care provider. Document Revised: 04/10/2022 Document Reviewed: 04/10/2022 Elsevier Patient Education  2024 [employer].

## 2023-11-29 DIAGNOSIS — E1129 Type 2 diabetes mellitus with other diabetic kidney complication: Secondary | ICD-10-CM | POA: Diagnosis not present

## 2023-11-29 DIAGNOSIS — Z992 Dependence on renal dialysis: Secondary | ICD-10-CM | POA: Diagnosis not present

## 2023-11-29 DIAGNOSIS — D631 Anemia in chronic kidney disease: Secondary | ICD-10-CM | POA: Diagnosis not present

## 2023-11-29 DIAGNOSIS — N2581 Secondary hyperparathyroidism of renal origin: Secondary | ICD-10-CM | POA: Diagnosis not present

## 2023-11-29 DIAGNOSIS — N186 End stage renal disease: Secondary | ICD-10-CM | POA: Diagnosis not present

## 2023-11-29 DIAGNOSIS — D508 Other iron deficiency anemias: Secondary | ICD-10-CM | POA: Diagnosis not present

## 2023-12-01 DIAGNOSIS — N186 End stage renal disease: Secondary | ICD-10-CM | POA: Diagnosis not present

## 2023-12-01 DIAGNOSIS — E1129 Type 2 diabetes mellitus with other diabetic kidney complication: Secondary | ICD-10-CM | POA: Diagnosis not present

## 2023-12-01 DIAGNOSIS — D631 Anemia in chronic kidney disease: Secondary | ICD-10-CM | POA: Diagnosis not present

## 2023-12-01 DIAGNOSIS — Z992 Dependence on renal dialysis: Secondary | ICD-10-CM | POA: Diagnosis not present

## 2023-12-01 DIAGNOSIS — D508 Other iron deficiency anemias: Secondary | ICD-10-CM | POA: Diagnosis not present

## 2023-12-01 DIAGNOSIS — N2581 Secondary hyperparathyroidism of renal origin: Secondary | ICD-10-CM | POA: Diagnosis not present

## 2023-12-04 DIAGNOSIS — N2581 Secondary hyperparathyroidism of renal origin: Secondary | ICD-10-CM | POA: Diagnosis not present

## 2023-12-04 DIAGNOSIS — Z992 Dependence on renal dialysis: Secondary | ICD-10-CM | POA: Diagnosis not present

## 2023-12-04 DIAGNOSIS — D508 Other iron deficiency anemias: Secondary | ICD-10-CM | POA: Diagnosis not present

## 2023-12-04 DIAGNOSIS — E1129 Type 2 diabetes mellitus with other diabetic kidney complication: Secondary | ICD-10-CM | POA: Diagnosis not present

## 2023-12-04 DIAGNOSIS — N186 End stage renal disease: Secondary | ICD-10-CM | POA: Diagnosis not present

## 2023-12-04 DIAGNOSIS — D631 Anemia in chronic kidney disease: Secondary | ICD-10-CM | POA: Diagnosis not present

## 2023-12-04 NOTE — Progress Notes (Signed)
 Pt called for pre procedure instructions. Arrival time 0745 NPO after midnight explained Instructed to take am meds with sip of water and confirmed blood thinner consistency, has not missed any doses of Eliquis. Instructed pt need for ride home tomorrow and have responsible adult with them for 24 hrs post procedure.

## 2023-12-05 ENCOUNTER — Ambulatory Visit (HOSPITAL_COMMUNITY): Admitting: Certified Registered Nurse Anesthetist

## 2023-12-05 ENCOUNTER — Encounter (HOSPITAL_COMMUNITY)
Admission: RE | Disposition: A | Discharge status: Home / Self Care | Payer: Self-pay | Source: Home / Self Care | Attending: Internal Medicine

## 2023-12-05 ENCOUNTER — Ambulatory Visit (HOSPITAL_COMMUNITY)
Admission: RE | Admit: 2023-12-05 | Discharge: 2023-12-05 | Disposition: A | Discharge status: Home / Self Care | Attending: Internal Medicine | Admitting: Internal Medicine

## 2023-12-05 ENCOUNTER — Telehealth (INDEPENDENT_AMBULATORY_CARE_PROVIDER_SITE_OTHER): Payer: Self-pay | Admitting: Primary Care

## 2023-12-05 ENCOUNTER — Ambulatory Visit (HOSPITAL_BASED_OUTPATIENT_CLINIC_OR_DEPARTMENT_OTHER): Admitting: Certified Registered Nurse Anesthetist

## 2023-12-05 DIAGNOSIS — I4891 Unspecified atrial fibrillation: Secondary | ICD-10-CM | POA: Diagnosis not present

## 2023-12-05 DIAGNOSIS — I132 Hypertensive heart and chronic kidney disease with heart failure and with stage 5 chronic kidney disease, or end stage renal disease: Secondary | ICD-10-CM | POA: Insufficient documentation

## 2023-12-05 DIAGNOSIS — E669 Obesity, unspecified: Secondary | ICD-10-CM | POA: Insufficient documentation

## 2023-12-05 DIAGNOSIS — E1122 Type 2 diabetes mellitus with diabetic chronic kidney disease: Secondary | ICD-10-CM | POA: Diagnosis not present

## 2023-12-05 DIAGNOSIS — I5022 Chronic systolic (congestive) heart failure: Secondary | ICD-10-CM | POA: Diagnosis not present

## 2023-12-05 DIAGNOSIS — N186 End stage renal disease: Secondary | ICD-10-CM | POA: Diagnosis not present

## 2023-12-05 DIAGNOSIS — Z7901 Long term (current) use of anticoagulants: Secondary | ICD-10-CM | POA: Insufficient documentation

## 2023-12-05 DIAGNOSIS — Z6841 Body Mass Index (BMI) 40.0 and over, adult: Secondary | ICD-10-CM | POA: Insufficient documentation

## 2023-12-05 DIAGNOSIS — Z794 Long term (current) use of insulin: Secondary | ICD-10-CM | POA: Diagnosis not present

## 2023-12-05 DIAGNOSIS — Z992 Dependence on renal dialysis: Secondary | ICD-10-CM | POA: Diagnosis not present

## 2023-12-05 DIAGNOSIS — I5082 Biventricular heart failure: Secondary | ICD-10-CM | POA: Diagnosis not present

## 2023-12-05 DIAGNOSIS — Z8249 Family history of ischemic heart disease and other diseases of the circulatory system: Secondary | ICD-10-CM | POA: Insufficient documentation

## 2023-12-05 DIAGNOSIS — I4819 Other persistent atrial fibrillation: Secondary | ICD-10-CM | POA: Insufficient documentation

## 2023-12-05 DIAGNOSIS — I2510 Atherosclerotic heart disease of native coronary artery without angina pectoris: Secondary | ICD-10-CM | POA: Insufficient documentation

## 2023-12-05 DIAGNOSIS — I5032 Chronic diastolic (congestive) heart failure: Secondary | ICD-10-CM | POA: Diagnosis not present

## 2023-12-05 DIAGNOSIS — G4733 Obstructive sleep apnea (adult) (pediatric): Secondary | ICD-10-CM | POA: Diagnosis not present

## 2023-12-05 DIAGNOSIS — J449 Chronic obstructive pulmonary disease, unspecified: Secondary | ICD-10-CM | POA: Diagnosis not present

## 2023-12-05 HISTORY — PX: CARDIOVERSION: EP1203

## 2023-12-05 LAB — GLUCOSE, CAPILLARY: Glucose-Capillary: 106 mg/dL — ABNORMAL HIGH (ref 70–99)

## 2023-12-05 SURGERY — CARDIOVERSION (CATH LAB)
Anesthesia: General

## 2023-12-05 MED ORDER — ONDANSETRON HCL 4 MG/2ML IJ SOLN
INTRAMUSCULAR | Status: DC | PRN
Start: 2023-12-05 — End: 2023-12-05
  Administered 2023-12-05: 4 mg via INTRAVENOUS

## 2023-12-05 MED ORDER — PROPOFOL 10 MG/ML IV BOLUS
INTRAVENOUS | Status: DC | PRN
  Administered 2023-12-05: 40 mg via INTRAVENOUS
  Administered 2023-12-05: 20 mg via INTRAVENOUS
  Administered 2023-12-05 (×3): 10 mg via INTRAVENOUS

## 2023-12-05 MED ORDER — METOCLOPRAMIDE HCL 5 MG/ML IJ SOLN
INTRAMUSCULAR | Status: DC | PRN
  Administered 2023-12-05: 10 mg via INTRAVENOUS

## 2023-12-05 SURGICAL SUPPLY — 1 items: PAD DEFIB RADIO PHYSIO CONN (PAD) ×1 IMPLANT

## 2023-12-05 NOTE — CV Procedure (Signed)
    DIRECT CURRENT CARDIOVERSION  NAME:  [name]   MRN: [id number] DOB:  1956-08-06   ADMIT DATE: 12/05/2023   INDICATIONS: Atrial fibrillation    PROCEDURE:   Informed consent was obtained prior to the procedure. The risks, benefits and alternatives for the procedure were discussed and the patient comprehended these risks. Once an appropriate time out was taken, the patient had the defibrillator pads placed in the anterior and posterior position. The patient then underwent sedation by the anesthesia service. Once an appropriate level of sedation was achieved, the patient received a single biphasic, synchronized 200J shock with prompt conversion to sinus rhythm with PACs. No apparent complications.  Jules Oar, MD  9:53 AM

## 2023-12-05 NOTE — Progress Notes (Signed)
 [facility]  [name], is a 68 y.o. female  NFA:[id number]  ION:[id number]  DOB - [birth year]  Chief Complaint  Patient presents with   Anxiety   Cough    Phlegm- white and yellow Started 3 weeks ago OTC coughs syrup with no relief and keeps up at night         Subjective:   [name] is a 68 y.o. female here today for an acute visit.  Patient still complains of cough medication prescribed after hospitalization caused too much.  Therefore had a virtual visit and prescribed Tessalon Perles and guaifenesin.  Only a little improvement.  She also voices concerns about feeling anxious and anxiety.  She definitely is a complex patient and has substantial amount of comorbidities.  This alone is very stressful for her.   No problems updated.  Comprehensive ROS Pertinent positive and negative noted in HPI   Allergies  Allergen Reactions   Bee Pollen Anaphylaxis and Other (See Comments)    UNCONFIRMED, but IS allergic to bee VENOM   Bee Venom Anaphylaxis   Sulfa Antibiotics Itching   Chlorhexidine    Iodinated Contrast Media Nausea And Vomiting    Past Medical History:  Diagnosis Date   Acute on chronic systolic CHF (congestive heart failure) (HCC) 12/21/2020   Allergic rhinitis    Anemia    Arthritis    Asthma    Chronic diastolic CHF (congestive heart failure) (HCC)    Chronic kidney disease    Dialysis Tu/TH/Sa   COPD (chronic obstructive pulmonary disease) (HCC)    COVID-19 03/28/2023   Depression    DM (diabetes mellitus) (HCC)    DVT (deep vein thrombosis) in pregnancy    Gastroenteritis 03/27/2023   GERD (gastroesophageal reflux disease)    HTN (hypertension)    Hyperlipidemia    Obesity    Pneumonia 04/2018   RIGHT LOBE   Sleep apnea    needs to be retested - to get new cpap    Current Outpatient Medications on File Prior to Visit  Medication Sig Dispense Refill   acetaminophen (TYLENOL) 325 MG tablet Take 2 tablets  (650 mg total) by mouth every 6 (six) hours as needed for mild pain or moderate pain.     albuterol (PROVENTIL HFA) 108 (90 Base) MCG/ACT inhaler Inhale 1-2 puffs into the lungs every 6 (six) hours as needed for wheezing or shortness of breath. 18 g 2   amiodarone (PACERONE) 200 MG tablet Take 1 tablet (200 mg total) by mouth daily. (Patient taking differently: Take 200 mg by mouth 2 (two) times daily.) 30 tablet 6   benzonatate (TESSALON PERLES) 100 MG capsule Take 2 capsules (200 mg total) by mouth 2 (two) times daily as needed for cough. 30 capsule 0   chlorpheniramine-HYDROcodone (TUSSIONEX) 10-8 MG/5ML Take 5 mLs by mouth every 12 (twelve) hours as needed for cough. 70 mL 0   diphenhydrAMINE (BENADRYL) 25 MG tablet Take 25 mg by mouth every 6 (six) hours as needed for itching.     DULoxetine (CYMBALTA) 60 MG capsule TAKE ONE CAPSULE (60MG  TOTAL) BY MOUTH DAILY AT 9AM 180 capsule 11   ELIQUIS 5 MG TABS tablet TAKE ONE TABLET (5MG  TOTAL) BY MOUTH TWICE DAILY @9AM -5PM 180 tablet 11   EPINEPHrine 0.3 mg/0.3 mL IJ SOAJ injection Inject 0.3 mg into the muscle as needed for anaphylaxis. 1 each 1   fluticasone (FLONASE ALLERGY RELIEF) 50 MCG/ACT nasal spray Place 2 sprays into both nostrils daily as needed  for allergies.     insulin glargine (LANTUS SOLOSTAR) 100 UNIT/ML Solostar Pen Inject 10 Units into the skin daily. 15 mL 3   insulin lispro (HUMALOG KWIKPEN) 100 UNIT/ML KwikPen For blood sugars 0-150 give 0 units of insulin, 151-200 give 2 units of insulin, 201-250 give 4 units, 251-300 give 6 units, 301-350 give 8 units, 351-400 give 10 units,> 400 give 12 units and call M.D. 15 mL 2   lidocaine-prilocaine (EMLA) cream Apply 1 Application topically Every Tuesday,Thursday,and Saturday with dialysis.     metoCLOPramide (REGLAN) 5 MG tablet Take 1 tablet (5 mg total) by mouth every 8 (eight) hours as needed for nausea. 30 tablet 0   metoprolol tartrate (LOPRESSOR) 25 MG tablet Take 0.5 tablets (12.5 mg  total) by mouth 2 (two) times daily. 30 tablet 6   midodrine (PROAMATINE) 10 MG tablet Take 1 tablet (10 mg total) by mouth See admin instructions. Take 10mg  (1 tablet) by mouth on Tuesday's, Thursday's, and Saturday's.     Multiple Vitamins-Minerals (MULTIVITAMIN WOMEN PO) Take 1 tablet by mouth daily.     nitroGLYCERIN (NITROSTAT) 0.4 MG SL tablet Place 1 tablet (0.4 mg total) under the tongue every 5 (five) minutes x 3 doses as needed for chest pain. 25 tablet 12   pantoprazole (PROTONIX) 40 MG tablet Take 1 tablet (40 mg total) by mouth daily. 90 tablet 0   polyethylene glycol powder (MIRALAX) 17 GM/SCOOP powder Take 17 g by mouth daily as needed (constipation.).     rosuvastatin (CRESTOR) 10 MG tablet TAKE ONE TABLET BY MOUTH DAILY AT 5PM (Patient taking differently: Take 10 mg by mouth in the morning.) 90 tablet 3   No current facility-administered medications on file prior to visit.   Health Maintenance  Topic Date Due   Eye exam for diabetics  Never done   Pneumonia Vaccine (2 of 2 - PCV) 02/26/2013   COVID-19 Vaccine (3 - Pfizer risk series) 01/01/2020   DEXA scan (bone density measurement)  Never done   Mammogram  12/24/2022   Complete foot exam   06/08/2023   Zoster (Shingles) Vaccine (1 of 2) 02/27/2024*   Flu Shot  03/21/2024   Hemoglobin A1C  05/11/2024   Medicare Annual Wellness Visit  06/03/2024   Colon Cancer Screening  03/04/2025   DTaP/Tdap/Td vaccine (2 - Td or Tdap) 03/03/2029   Hepatitis C Screening  Completed   HPV Vaccine  Aged Out   Meningitis B Vaccine  Aged Out  *Topic was postponed. The date shown is not the original due date.    Objective:   Vitals:   11/28/23 0908  BP: 113/70  Pulse: 98  Resp: 16  SpO2: 97%  Weight: 277 lb 6.4 oz (125.8 kg)  Height: 5\' 7"  (1.702 m)   Assessment & Plan  Martena was seen today for anxiety and cough.  Diagnoses and all orders for this visit:  Generalized anxiety disorder See HPI  Cough productive of purulent  sputum Advised patient to pick up Tussionex it is one of the most effective cough suppressant also has hydrocodone in it.  Other orders -     busPIRone (BUSPAR) 5 MG tablet; Take 1 tablet (5 mg total) by mouth 2 (two) times daily.     Patient have been counseled extensively about nutrition and exercise. Other issues discussed during this visit include: low cholesterol diet, weight control and daily exercise, foot care, annual eye examinations at Ophthalmology, importance of adherence with medications and regular follow-up. We also  discussed long term complications of uncontrolled diabetes and hypertension.   6 weeks effectiveness of medication  The patient was given clear instructions to go to ER or return to medical center if symptoms don't improve, worsen or new problems develop. The patient verbalized understanding. The patient was told to call to get lab results if they haven't heard anything in the next week.   This note has been created with Education officer, environmental. Any transcriptional errors are unintentional.   Marius Siemens, NP 12/05/2023, 11:57 AM

## 2023-12-05 NOTE — Anesthesia Preprocedure Evaluation (Addendum)
 Anesthesia Evaluation  Patient identified by MRN, date of birth, ID band Patient awake    Reviewed: Allergy & Precautions, NPO status , Patient's Chart, lab work & pertinent test results  History of Anesthesia Complications Negative for: history of anesthetic complications  Airway Mallampati: II  TM Distance: >3 FB Neck ROM: Full    Dental  (+) Edentulous Upper, Dental Advisory Given   Pulmonary shortness of breath, asthma , sleep apnea , COPD    + decreased breath sounds      Cardiovascular hypertension, Pt. on home beta blockers +CHF  + dysrhythmias Atrial Fibrillation  Rhythm:Irregular  Echo: 1. Abnormal septal motion . Left ventricular ejection fraction, by  estimation, is 50 to 55%. The left ventricle has low normal function. The  left ventricle has no regional wall motion abnormalities.   2. Right ventricular systolic function is normal. The right ventricular  size is normal.   3. Left atrial size was severely dilated.   4. Right atrial size was severely dilated.   5. Post repair with annuloplasty ring no doppler/color flow done. The  mitral valve was not assessed. No evidence of mitral valve regurgitation.  No evidence of mitral stenosis.   6. Post repair with ring no doppler/color flow done. Tricuspid valve  regurgitation is not demonstrated.   7. The aortic valve was not assessed. Aortic valve regurgitation is not  visualized. No aortic stenosis is present.   8. The inferior vena cava is normal in size with greater than 50%  respiratory variability, suggesting right atrial pressure of 3 mmHg.      Neuro/Psych  PSYCHIATRIC DISORDERS Anxiety Depression    negative neurological ROS     GI/Hepatic Neg liver ROS,GERD  Medicated,,  Endo/Other  diabetes, Type 2, Insulin Dependent  Lab Results      Component                Value               Date                      HGBA1C                   5.7 (H)              11/09/2023             Renal/GU ESRF and DialysisRenal diseaseLab Results      Component                Value               Date                      NA                       134 (L)             11/14/2023                K                        4.6                 11/14/2023                CO2  26                  11/14/2023                GLUCOSE                  120 (H)             11/14/2023                BUN                      31 (H)              11/14/2023                CREATININE               6.93 (H)            11/14/2023                CALCIUM                  9.9                 11/14/2023                EGFR                     4 (L)               02/21/2023                GFRNONAA                 6 (L)               11/14/2023                Musculoskeletal  (+) Arthritis ,    Abdominal   Peds  Hematology  (+) Blood dyscrasia, anemia Lab Results      Component                Value               Date                      WBC                      7.6                 11/14/2023                HGB                      8.8 (L)             11/14/2023                HCT                      28.1 (L)            11/14/2023                MCV                      100.7 (H)  11/14/2023                PLT                      171                 11/14/2023              Anesthesia Other Findings - HLD  nauseated  Reproductive/Obstetrics                              Anesthesia Physical Anesthesia Plan  ASA: 3  Anesthesia Plan: General   Post-op Pain Management: Minimal or no pain anticipated   Induction: Intravenous  PONV Risk Score and Plan: 0 and 3 and Metaclopromide  Airway Management Planned: Natural Airway and Simple Face Mask  Additional Equipment: None  Intra-op Plan:   Post-operative Plan:   Informed Consent: I have reviewed the patients History and Physical, chart, labs and discussed the  procedure including the risks, benefits and alternatives for the proposed anesthesia with the patient or authorized representative who has indicated his/her understanding and acceptance.       Plan Discussed with: CRNA  Anesthesia Plan Comments:         Anesthesia Quick Evaluation

## 2023-12-05 NOTE — Interval H&P Note (Signed)
 History and Physical Interval Note:  12/05/2023 9:37 AM  [name]  has presented today for surgery, with the diagnosis of AFIB.  The various methods of treatment have been discussed with the patient and family. After consideration of risks, benefits and other options for treatment, the patient has consented to  Procedure(s): CARDIOVERSION (N/A) as a surgical intervention.  The patient's history has been reviewed, patient examined, no change in status, stable for surgery.  I have reviewed the patient's chart and labs.  Questions were answered to the patient's satisfaction.     Jonnelle Lawniczak

## 2023-12-05 NOTE — Transfer of Care (Signed)
 Immediate Anesthesia Transfer of Care Note  Patient: [name]  Procedure(s) Performed: CARDIOVERSION  Patient Location: PACU  Anesthesia Type:General  Level of Consciousness: awake, alert , and oriented  Airway & Oxygen Therapy: Patient Spontanous Breathing and Patient connected to nasal cannula oxygen  Post-op Assessment: Report given to RN and Post -op Vital signs reviewed and stable  Post vital signs: Reviewed and stable  Last Vitals:  Vitals Value Taken Time  BP    Temp    Pulse    Resp    SpO2      Last Pain:  Vitals:   12/05/23 0756  TempSrc: Temporal         Complications: No notable events documented.

## 2023-12-05 NOTE — Anesthesia Postprocedure Evaluation (Signed)
 Anesthesia Post Note  Patient: Cerinity Zynda [name]  Procedure(s) Performed: CARDIOVERSION     Patient location during evaluation: Cath Lab Anesthesia Type: General Level of consciousness: awake and alert Pain management: pain level controlled Vital Signs Assessment: post-procedure vital signs reviewed and stable Respiratory status: spontaneous breathing and nonlabored ventilation Cardiovascular status: blood pressure returned to baseline and stable Postop Assessment: no apparent nausea or vomiting Anesthetic complications: no   No notable events documented.  Last Vitals:  Vitals:   12/05/23 1020 12/05/23 1025  BP: 99/63 105/80  Pulse: 69 66  Resp: (!) 22 20  Temp:    SpO2: 93% 95%    Last Pain:  Vitals:   12/05/23 0950  TempSrc:   PainSc: 0-No pain                 Hamad Whyte

## 2023-12-05 NOTE — Telephone Encounter (Signed)
 Called pt to put them on our schedule. Pt did not answer and VML for pt to contact us  so that we can schedule them.

## 2023-12-06 ENCOUNTER — Encounter (HOSPITAL_COMMUNITY): Payer: Self-pay | Admitting: Internal Medicine

## 2023-12-06 DIAGNOSIS — E1129 Type 2 diabetes mellitus with other diabetic kidney complication: Secondary | ICD-10-CM | POA: Diagnosis not present

## 2023-12-06 DIAGNOSIS — N2581 Secondary hyperparathyroidism of renal origin: Secondary | ICD-10-CM | POA: Diagnosis not present

## 2023-12-06 DIAGNOSIS — N186 End stage renal disease: Secondary | ICD-10-CM | POA: Diagnosis not present

## 2023-12-06 DIAGNOSIS — D508 Other iron deficiency anemias: Secondary | ICD-10-CM | POA: Diagnosis not present

## 2023-12-06 DIAGNOSIS — D631 Anemia in chronic kidney disease: Secondary | ICD-10-CM | POA: Diagnosis not present

## 2023-12-06 DIAGNOSIS — Z992 Dependence on renal dialysis: Secondary | ICD-10-CM | POA: Diagnosis not present

## 2023-12-08 DIAGNOSIS — D631 Anemia in chronic kidney disease: Secondary | ICD-10-CM | POA: Diagnosis not present

## 2023-12-08 DIAGNOSIS — Z992 Dependence on renal dialysis: Secondary | ICD-10-CM | POA: Diagnosis not present

## 2023-12-08 DIAGNOSIS — E1129 Type 2 diabetes mellitus with other diabetic kidney complication: Secondary | ICD-10-CM | POA: Diagnosis not present

## 2023-12-08 DIAGNOSIS — D508 Other iron deficiency anemias: Secondary | ICD-10-CM | POA: Diagnosis not present

## 2023-12-08 DIAGNOSIS — N2581 Secondary hyperparathyroidism of renal origin: Secondary | ICD-10-CM | POA: Diagnosis not present

## 2023-12-08 DIAGNOSIS — N186 End stage renal disease: Secondary | ICD-10-CM | POA: Diagnosis not present

## 2023-12-11 DIAGNOSIS — E1129 Type 2 diabetes mellitus with other diabetic kidney complication: Secondary | ICD-10-CM | POA: Diagnosis not present

## 2023-12-11 DIAGNOSIS — N186 End stage renal disease: Secondary | ICD-10-CM | POA: Diagnosis not present

## 2023-12-11 DIAGNOSIS — N2581 Secondary hyperparathyroidism of renal origin: Secondary | ICD-10-CM | POA: Diagnosis not present

## 2023-12-11 DIAGNOSIS — Z992 Dependence on renal dialysis: Secondary | ICD-10-CM | POA: Diagnosis not present

## 2023-12-11 DIAGNOSIS — D508 Other iron deficiency anemias: Secondary | ICD-10-CM | POA: Diagnosis not present

## 2023-12-11 DIAGNOSIS — D631 Anemia in chronic kidney disease: Secondary | ICD-10-CM | POA: Diagnosis not present

## 2023-12-13 DIAGNOSIS — N186 End stage renal disease: Secondary | ICD-10-CM | POA: Diagnosis not present

## 2023-12-13 DIAGNOSIS — D631 Anemia in chronic kidney disease: Secondary | ICD-10-CM | POA: Diagnosis not present

## 2023-12-13 DIAGNOSIS — N2581 Secondary hyperparathyroidism of renal origin: Secondary | ICD-10-CM | POA: Diagnosis not present

## 2023-12-13 DIAGNOSIS — Z992 Dependence on renal dialysis: Secondary | ICD-10-CM | POA: Diagnosis not present

## 2023-12-13 DIAGNOSIS — D508 Other iron deficiency anemias: Secondary | ICD-10-CM | POA: Diagnosis not present

## 2023-12-13 DIAGNOSIS — E1129 Type 2 diabetes mellitus with other diabetic kidney complication: Secondary | ICD-10-CM | POA: Diagnosis not present

## 2023-12-15 DIAGNOSIS — E1129 Type 2 diabetes mellitus with other diabetic kidney complication: Secondary | ICD-10-CM | POA: Diagnosis not present

## 2023-12-15 DIAGNOSIS — D508 Other iron deficiency anemias: Secondary | ICD-10-CM | POA: Diagnosis not present

## 2023-12-15 DIAGNOSIS — N2581 Secondary hyperparathyroidism of renal origin: Secondary | ICD-10-CM | POA: Diagnosis not present

## 2023-12-15 DIAGNOSIS — Z992 Dependence on renal dialysis: Secondary | ICD-10-CM | POA: Diagnosis not present

## 2023-12-15 DIAGNOSIS — N186 End stage renal disease: Secondary | ICD-10-CM | POA: Diagnosis not present

## 2023-12-15 DIAGNOSIS — D631 Anemia in chronic kidney disease: Secondary | ICD-10-CM | POA: Diagnosis not present

## 2023-12-18 DIAGNOSIS — Z992 Dependence on renal dialysis: Secondary | ICD-10-CM | POA: Diagnosis not present

## 2023-12-18 DIAGNOSIS — N2581 Secondary hyperparathyroidism of renal origin: Secondary | ICD-10-CM | POA: Diagnosis not present

## 2023-12-18 DIAGNOSIS — E1129 Type 2 diabetes mellitus with other diabetic kidney complication: Secondary | ICD-10-CM | POA: Diagnosis not present

## 2023-12-18 DIAGNOSIS — D631 Anemia in chronic kidney disease: Secondary | ICD-10-CM | POA: Diagnosis not present

## 2023-12-18 DIAGNOSIS — D508 Other iron deficiency anemias: Secondary | ICD-10-CM | POA: Diagnosis not present

## 2023-12-18 DIAGNOSIS — N186 End stage renal disease: Secondary | ICD-10-CM | POA: Diagnosis not present

## 2023-12-19 DIAGNOSIS — Z992 Dependence on renal dialysis: Secondary | ICD-10-CM | POA: Diagnosis not present

## 2023-12-19 DIAGNOSIS — N186 End stage renal disease: Secondary | ICD-10-CM | POA: Diagnosis not present

## 2023-12-19 DIAGNOSIS — E1122 Type 2 diabetes mellitus with diabetic chronic kidney disease: Secondary | ICD-10-CM | POA: Diagnosis not present

## 2023-12-20 DIAGNOSIS — Z992 Dependence on renal dialysis: Secondary | ICD-10-CM | POA: Diagnosis not present

## 2023-12-20 DIAGNOSIS — D508 Other iron deficiency anemias: Secondary | ICD-10-CM | POA: Diagnosis not present

## 2023-12-20 DIAGNOSIS — R079 Chest pain, unspecified: Secondary | ICD-10-CM | POA: Diagnosis not present

## 2023-12-20 DIAGNOSIS — N2581 Secondary hyperparathyroidism of renal origin: Secondary | ICD-10-CM | POA: Diagnosis not present

## 2023-12-20 DIAGNOSIS — E1129 Type 2 diabetes mellitus with other diabetic kidney complication: Secondary | ICD-10-CM | POA: Diagnosis not present

## 2023-12-20 DIAGNOSIS — D631 Anemia in chronic kidney disease: Secondary | ICD-10-CM | POA: Diagnosis not present

## 2023-12-20 DIAGNOSIS — N186 End stage renal disease: Secondary | ICD-10-CM | POA: Diagnosis not present

## 2023-12-22 DIAGNOSIS — N186 End stage renal disease: Secondary | ICD-10-CM | POA: Diagnosis not present

## 2023-12-22 DIAGNOSIS — E1129 Type 2 diabetes mellitus with other diabetic kidney complication: Secondary | ICD-10-CM | POA: Diagnosis not present

## 2023-12-22 DIAGNOSIS — Z992 Dependence on renal dialysis: Secondary | ICD-10-CM | POA: Diagnosis not present

## 2023-12-22 DIAGNOSIS — D631 Anemia in chronic kidney disease: Secondary | ICD-10-CM | POA: Diagnosis not present

## 2023-12-22 DIAGNOSIS — N2581 Secondary hyperparathyroidism of renal origin: Secondary | ICD-10-CM | POA: Diagnosis not present

## 2023-12-22 DIAGNOSIS — R079 Chest pain, unspecified: Secondary | ICD-10-CM | POA: Diagnosis not present

## 2023-12-22 DIAGNOSIS — D508 Other iron deficiency anemias: Secondary | ICD-10-CM | POA: Diagnosis not present

## 2023-12-24 ENCOUNTER — Observation Stay (HOSPITAL_BASED_OUTPATIENT_CLINIC_OR_DEPARTMENT_OTHER)
Admission: EM | Admit: 2023-12-24 | Discharge: 2023-12-27 | Disposition: A | Discharge status: Home / Self Care | Attending: Family Medicine | Admitting: Family Medicine

## 2023-12-24 ENCOUNTER — Emergency Department (HOSPITAL_BASED_OUTPATIENT_CLINIC_OR_DEPARTMENT_OTHER)

## 2023-12-24 ENCOUNTER — Other Ambulatory Visit: Payer: Self-pay

## 2023-12-24 ENCOUNTER — Encounter (HOSPITAL_BASED_OUTPATIENT_CLINIC_OR_DEPARTMENT_OTHER): Payer: Self-pay | Admitting: *Deleted

## 2023-12-24 DIAGNOSIS — Z794 Long term (current) use of insulin: Secondary | ICD-10-CM | POA: Diagnosis not present

## 2023-12-24 DIAGNOSIS — J9601 Acute respiratory failure with hypoxia: Secondary | ICD-10-CM | POA: Insufficient documentation

## 2023-12-24 DIAGNOSIS — Z79899 Other long term (current) drug therapy: Secondary | ICD-10-CM | POA: Diagnosis not present

## 2023-12-24 DIAGNOSIS — I4819 Other persistent atrial fibrillation: Secondary | ICD-10-CM | POA: Diagnosis present

## 2023-12-24 DIAGNOSIS — I359 Nonrheumatic aortic valve disorder, unspecified: Secondary | ICD-10-CM | POA: Diagnosis not present

## 2023-12-24 DIAGNOSIS — Z7901 Long term (current) use of anticoagulants: Secondary | ICD-10-CM | POA: Insufficient documentation

## 2023-12-24 DIAGNOSIS — I428 Other cardiomyopathies: Secondary | ICD-10-CM | POA: Diagnosis not present

## 2023-12-24 DIAGNOSIS — E66813 Obesity, class 3: Secondary | ICD-10-CM | POA: Diagnosis not present

## 2023-12-24 DIAGNOSIS — Z6841 Body Mass Index (BMI) 40.0 and over, adult: Secondary | ICD-10-CM | POA: Insufficient documentation

## 2023-12-24 DIAGNOSIS — N186 End stage renal disease: Secondary | ICD-10-CM

## 2023-12-24 DIAGNOSIS — E785 Hyperlipidemia, unspecified: Secondary | ICD-10-CM | POA: Insufficient documentation

## 2023-12-24 DIAGNOSIS — M940 Chondrocostal junction syndrome [Tietze]: Secondary | ICD-10-CM | POA: Diagnosis not present

## 2023-12-24 DIAGNOSIS — G4733 Obstructive sleep apnea (adult) (pediatric): Secondary | ICD-10-CM

## 2023-12-24 DIAGNOSIS — R072 Precordial pain: Principal | ICD-10-CM

## 2023-12-24 DIAGNOSIS — I4891 Unspecified atrial fibrillation: Secondary | ICD-10-CM | POA: Diagnosis not present

## 2023-12-24 DIAGNOSIS — R0789 Other chest pain: Secondary | ICD-10-CM | POA: Diagnosis not present

## 2023-12-24 DIAGNOSIS — I700 Atherosclerosis of aorta: Secondary | ICD-10-CM | POA: Diagnosis not present

## 2023-12-24 DIAGNOSIS — J449 Chronic obstructive pulmonary disease, unspecified: Secondary | ICD-10-CM | POA: Diagnosis present

## 2023-12-24 DIAGNOSIS — F418 Other specified anxiety disorders: Secondary | ICD-10-CM | POA: Diagnosis not present

## 2023-12-24 DIAGNOSIS — F32A Depression, unspecified: Secondary | ICD-10-CM | POA: Diagnosis present

## 2023-12-24 DIAGNOSIS — R079 Chest pain, unspecified: Principal | ICD-10-CM | POA: Diagnosis present

## 2023-12-24 DIAGNOSIS — I5022 Chronic systolic (congestive) heart failure: Secondary | ICD-10-CM | POA: Insufficient documentation

## 2023-12-24 DIAGNOSIS — Z992 Dependence on renal dialysis: Secondary | ICD-10-CM | POA: Insufficient documentation

## 2023-12-24 DIAGNOSIS — I517 Cardiomegaly: Secondary | ICD-10-CM | POA: Diagnosis not present

## 2023-12-24 DIAGNOSIS — F419 Anxiety disorder, unspecified: Secondary | ICD-10-CM | POA: Diagnosis present

## 2023-12-24 DIAGNOSIS — I4811 Longstanding persistent atrial fibrillation: Secondary | ICD-10-CM | POA: Diagnosis present

## 2023-12-24 LAB — BASIC METABOLIC PANEL WITH GFR
Anion gap: 12 (ref 5–15)
Anion gap: 11 (ref 5–15)
BUN: 32 mg/dL — ABNORMAL HIGH (ref 8–23)
BUN: 33 mg/dL — ABNORMAL HIGH (ref 8–23)
CO2: 30 mmol/L (ref 22–32)
CO2: 31 mmol/L (ref 22–32)
Calcium: 10.1 mg/dL (ref 8.9–10.3)
Calcium: 9.9 mg/dL (ref 8.9–10.3)
Chloride: 96 mmol/L — ABNORMAL LOW (ref 98–111)
Chloride: 97 mmol/L — ABNORMAL LOW (ref 98–111)
Creatinine, Ser: 8.7 mg/dL — ABNORMAL HIGH (ref 0.44–1.00)
Creatinine, Ser: 8.79 mg/dL — ABNORMAL HIGH (ref 0.44–1.00)
GFR calc Af Amer: 5 mL/min — ABNORMAL LOW (ref >60)
GFR, Estimated: 5 mL/min — ABNORMAL LOW (ref >60)
Glucose, Bld: 93 mg/dL (ref 70–99)
Glucose, Bld: 102 mg/dL — ABNORMAL HIGH (ref 70–99)
Potassium: 5.7 mmol/L — ABNORMAL HIGH (ref 3.5–5.1)
Potassium: 5.1 mmol/L (ref 3.5–5.1)
Sodium: 138 mmol/L (ref 135–145)
Sodium: 139 mmol/L (ref 135–145)

## 2023-12-24 LAB — PRO BRAIN NATRIURETIC PEPTIDE: Pro Brain Natriuretic Peptide: 8609 pg/mL — ABNORMAL HIGH (ref <300.0)

## 2023-12-24 LAB — HEPATIC FUNCTION PANEL
ALT: 13 U/L (ref 0–44)
AST: 26 U/L (ref 15–41)
Albumin: 4.1 g/dL (ref 3.5–5.0)
Alkaline Phosphatase: 80 U/L (ref 38–126)
Bilirubin, Direct: <0.1 mg/dL (ref 0.0–0.2)
Total Bilirubin: 0.5 mg/dL (ref 0.0–1.2)
Total Protein: 7.7 g/dL (ref 6.5–8.1)

## 2023-12-24 LAB — CBC
HCT: 35.7 % — ABNORMAL LOW (ref 36.0–46.0)
Hemoglobin: 11.4 g/dL — ABNORMAL LOW (ref 12.0–15.0)
MCH: 32 pg (ref 26.0–34.0)
MCHC: 31.9 g/dL (ref 30.0–36.0)
MCV: 100.3 fL — ABNORMAL HIGH (ref 80.0–100.0)
Platelets: 134 10*3/uL — ABNORMAL LOW (ref 150–400)
RBC: 3.56 MIL/uL — ABNORMAL LOW (ref 3.87–5.11)
RDW: 13.8 % (ref 11.5–15.5)
WBC: 3.8 10*3/uL — ABNORMAL LOW (ref 4.0–10.5)
nRBC: 0 % (ref 0.0–0.2)

## 2023-12-24 LAB — CBG MONITORING, ED: Glucose-Capillary: 180 mg/dL — ABNORMAL HIGH (ref 70–99)

## 2023-12-24 LAB — TROPONIN T, HIGH SENSITIVITY
Troponin T High Sensitivity: 71 ng/L — ABNORMAL HIGH (ref <19)
Troponin T High Sensitivity: 67 ng/L — ABNORMAL HIGH (ref <19)

## 2023-12-24 LAB — LIPASE, BLOOD: Lipase: 17 U/L (ref 11–51)

## 2023-12-24 MED ORDER — DIPHENHYDRAMINE HCL 25 MG PO CAPS
50.0000 mg | ORAL_CAPSULE | Freq: Once | ORAL | Status: AC
  Filled 2023-12-24: qty 2

## 2023-12-24 MED ORDER — FENTANYL CITRATE PF 50 MCG/ML IJ SOSY
25.0000 ug | PREFILLED_SYRINGE | Freq: Once | INTRAMUSCULAR | Status: AC
  Administered 2023-12-24: 25 ug via INTRAVENOUS
  Filled 2023-12-24: qty 1

## 2023-12-24 MED ORDER — DIPHENHYDRAMINE HCL 50 MG/ML IJ SOLN
50.0000 mg | Freq: Once | INTRAMUSCULAR | Status: AC
  Administered 2023-12-24: 50 mg via INTRAVENOUS
  Filled 2023-12-24: qty 1

## 2023-12-24 MED ORDER — ASPIRIN 325 MG PO TABS
325.0000 mg | ORAL_TABLET | Freq: Every day | ORAL | Status: DC
  Administered 2023-12-24: 325 mg via ORAL
  Filled 2023-12-24: qty 1

## 2023-12-24 MED ORDER — IOHEXOL 350 MG/ML SOLN
100.0000 mL | Freq: Once | INTRAVENOUS | Status: AC | PRN
  Administered 2023-12-24: 100 mL via INTRAVENOUS

## 2023-12-24 MED ORDER — METHYLPREDNISOLONE SODIUM SUCC 40 MG IJ SOLR
40.0000 mg | Freq: Once | INTRAMUSCULAR | Status: AC
  Administered 2023-12-24: 40 mg via INTRAVENOUS
  Filled 2023-12-24: qty 1

## 2023-12-24 NOTE — ED Provider Notes (Signed)
 [place] EMERGENCY DEPARTMENT AT [facility] Provider Note   CSN: [id number] Arrival date & time: 12/24/23  8469     History  Chief Complaint  Patient presents with  . Chest Pain  . Arm Pain  . Back Pain    [name] is a 68 y.o. female.  This is a 68 year old female presenting emergency department for chest pain.  Symptoms started this morning and woke her up from sleep.  Described as pressure.  Radiates to left arm, chest and back.  No nausea no vomiting.  Reports some mild shortness of breath.   Chest Pain Associated symptoms: back pain   Arm Pain Associated symptoms include chest pain.  Back Pain Associated symptoms: chest pain        Home Medications Prior to Admission medications   Medication Sig Start Date End Date Taking? Authorizing Provider  acetaminophen  (TYLENOL ) 325 MG tablet Take 2 tablets (650 mg total) by mouth every 6 (six) hours as needed for mild pain or moderate pain. 12/07/21   Arrien, Mauricio Daniel, MD  albuterol  (PROVENTIL  HFA) 108 [phone] Base) MCG/ACT inhaler Inhale 1-2 puffs into the lungs every 6 (six) hours as needed for wheezing or shortness of breath. 10/13/22   Marius Siemens, NP  amiodarone  (PACERONE ) 200 MG tablet Take 1 tablet (200 mg total) by mouth daily. Patient taking differently: Take 200 mg by mouth 2 (two) times daily. 11/23/23   Bensimhon, Rheta Celestine, MD  benzonatate  (TESSALON  PERLES) 100 MG capsule Take 2 capsules (200 mg total) by mouth 2 (two) times daily as needed for cough. 11/15/23   Marius Siemens, NP  busPIRone  (BUSPAR ) 5 MG tablet Take 1 tablet (5 mg total) by mouth 2 (two) times daily. 11/28/23   Marius Siemens, NP  chlorpheniramine-HYDROcodone  (TUSSIONEX) 10-8 MG/5ML Take 5 mLs by mouth every 12 (twelve) hours as needed for cough. 11/14/23   Krishnan, Gokul, MD  diphenhydrAMINE  (BENADRYL ) 25 MG tablet Take 25 mg by mouth every 6 (six) hours as needed for itching. 03/31/22   [provider]   DULoxetine  (CYMBALTA ) 60 MG capsule TAKE ONE CAPSULE (60MG  TOTAL) BY MOUTH DAILY AT 9AM 02/12/23   Marius Siemens, NP  ELIQUIS  5 MG TABS tablet TAKE ONE TABLET (5MG  TOTAL) BY MOUTH TWICE DAILY @9AM -5PM 09/06/23   Bensimhon, Rheta Celestine, MD  EPINEPHrine  0.3 mg/0.3 mL IJ SOAJ injection Inject 0.3 mg into the muscle as needed for anaphylaxis. 06/25/20   Marius Siemens, NP  fluticasone  (FLONASE  ALLERGY RELIEF) 50 MCG/ACT nasal spray Place 2 sprays into both nostrils daily as needed for allergies. 03/31/22   [provider]  insulin  glargine (LANTUS  SOLOSTAR) 100 UNIT/ML Solostar Pen Inject 10 Units into the skin daily. 09/27/23   Marius Siemens, NP  insulin  lispro (HUMALOG  KWIKPEN) 100 UNIT/ML KwikPen For blood sugars 0-150 give 0 units of insulin , 151-200 give 2 units of insulin , 201-250 give 4 units, 251-300 give 6 units, 301-350 give 8 units, 351-400 give 10 units,> 400 give 12 units and call M.D. 09/27/23   Newlin, Enobong, MD  lidocaine -prilocaine  (EMLA ) cream Apply 1 Application topically Every Tuesday,Thursday,and Saturday with dialysis. 06/01/22   [provider]  metoCLOPramide  (REGLAN ) 5 MG tablet Take 1 tablet (5 mg total) by mouth every 8 (eight) hours as needed for nausea. 07/10/23   Marius Siemens, NP  metoprolol  tartrate (LOPRESSOR ) 25 MG tablet Take 0.5 tablets (12.5 mg total) by mouth 2 (two) times daily. 11/23/23   Bensimhon, Bearl Limes  R, MD  midodrine  (PROAMATINE ) 10 MG tablet Take 1 tablet (10 mg total) by mouth See admin instructions. Take 10mg  (1 tablet) by mouth on Tuesday's, Thursday's, and Saturday's. 03/30/23   Daren Eck, DO  Multiple Vitamins-Minerals (MULTIVITAMIN WOMEN PO) Take 1 tablet by mouth daily.    [provider]  nitroGLYCERIN  (NITROSTAT ) 0.4 MG SL tablet Place 1 tablet (0.4 mg total) under the tongue every 5 (five) minutes x 3 doses as needed for chest pain. 11/08/23   Sheryle Donning, MD  pantoprazole  (PROTONIX ) 40 MG tablet  Take 1 tablet (40 mg total) by mouth daily. 04/24/23   Marius Siemens, NP  polyethylene glycol powder (MIRALAX ) 17 GM/SCOOP powder Take 17 g by mouth daily as needed (constipation.). 06/26/22   [provider]  rosuvastatin  (CRESTOR ) 10 MG tablet TAKE ONE TABLET BY MOUTH DAILY AT 5PM Patient taking differently: Take 10 mg by mouth in the morning. 04/13/23   Bensimhon, Rheta Celestine, MD      Allergies    Bee pollen, Bee venom, Sulfa antibiotics, Chlorhexidine , and Iodinated contrast media    Review of Systems   Review of Systems  Cardiovascular:  Positive for chest pain.  Musculoskeletal:  Positive for back pain.    Physical Exam Updated Vital Signs BP (!) 119/95   Pulse 74   Temp 97.6 F (36.4 C)   Resp 20   Ht 5\' 7"  (1.702 m)   Wt 125.2 kg   SpO2 98%   BMI 43.23 kg/m  Physical Exam Vitals and nursing note reviewed.  Constitutional:      Appearance: She is obese.  HENT:     Head: Normocephalic.  Cardiovascular:     Rate and Rhythm: Normal rate and regular rhythm.     Pulses:          Radial pulses are 2+ on the right side and 2+ on the left side.     Heart sounds: Normal heart sounds.  Pulmonary:     Effort: Pulmonary effort is normal.     Breath sounds: Normal breath sounds.  Musculoskeletal:     Right lower leg: Edema present.     Left lower leg: Edema present.  Skin:    General: Skin is warm and dry.     Capillary Refill: Capillary refill takes less than 2 seconds.  Neurological:     General: No focal deficit present.     Mental Status: She is alert and oriented to person, place, and time.  Psychiatric:        Mood and Affect: Mood normal.        Behavior: Behavior normal.     ED Results / Procedures / Treatments   Labs (all labs ordered are listed, but only abnormal results are displayed) Labs Reviewed  BASIC METABOLIC PANEL WITH GFR - Abnormal; Notable for the following components:      Result Value   Potassium 5.7 (*)    Chloride 96 (*)     BUN 32 (*)    Creatinine, Ser 8.70 (*)    GFR calc Af Amer 5 (*)    All other components within normal limits  CBC - Abnormal; Notable for the following components:   WBC 3.8 (*)    RBC 3.56 (*)    Hemoglobin 11.4 (*)    HCT 35.7 (*)    MCV 100.3 (*)    Platelets 134 (*)    All other components within normal limits  PRO BRAIN NATRIURETIC PEPTIDE - Abnormal;  Notable for the following components:   Pro Brain Natriuretic Peptide 8,609.0 (*)    All other components within normal limits  BASIC METABOLIC PANEL WITH GFR - Abnormal; Notable for the following components:   Chloride 97 (*)    Glucose, Bld 102 (*)    BUN 33 (*)    Creatinine, Ser 8.79 (*)    GFR, Estimated 5 (*)    All other components within normal limits  TROPONIN T, HIGH SENSITIVITY - Abnormal; Notable for the following components:   Troponin T High Sensitivity 71 (*)    All other components within normal limits  TROPONIN T, HIGH SENSITIVITY - Abnormal; Notable for the following components:   Troponin T High Sensitivity 67 (*)    All other components within normal limits  LIPASE, BLOOD  HEPATIC FUNCTION PANEL    EKG None  Radiology CT Angio Chest PE W and/or Wo Contrast Result Date: 12/24/2023 CLINICAL DATA:  Pulmonary embolism (PE) suspected, high prob. EXAM: CT ANGIOGRAPHY CHEST WITH CONTRAST TECHNIQUE: Multidetector CT imaging of the chest was performed using the standard protocol during bolus administration of intravenous contrast. Multiplanar CT image reconstructions and MIPs were obtained to evaluate the vascular anatomy. RADIATION DOSE REDUCTION: This exam was performed according to the departmental dose-optimization program which includes automated exposure control, adjustment of the mA and/or kV according to patient size and/or use of iterative reconstruction technique. CONTRAST:  OMNIPAQUE  IOHEXOL  350 MG/ML SOLN COMPARISON:  11/09/2023.  The FINDINGS: Cardiovascular: No evidence of embolism to the proximal  subsegmental pulmonary artery level. There is dilation of the main pulmonary trunk measuring up to 3.3 cm, which is nonspecific but can be seen with pulmonary artery hypertension. Moderate cardiomegaly. Left atrial appendage closure device noted. Mitral and tricuspid valve annuloplasty noted. No pericardial effusion. No aortic aneurysm. There are mild peripheral atherosclerotic vascular calcifications of thoracic aorta and its major branches. Mediastinum/Nodes: Visualized thyroid  gland appears grossly unremarkable. No solid / cystic mediastinal masses. The esophagus is nondistended precluding optimal assessment. No axillary, mediastinal or hilar lymphadenopathy by size criteria. Lungs/Pleura: The central tracheo-bronchial tree is patent. There are patchy areas of linear, plate-like atelectasis and/or scarring throughout bilateral lungs. There is mosaic attenuation of bilateral lung bases, consistent with heterogeneous air trapping related to small airways disease. No mass or consolidation. No pleural effusion or pneumothorax. No suspicious lung nodules. Upper Abdomen: Visualized upper abdominal viscera within normal limits. Musculoskeletal: The visualized soft tissues of the chest wall are grossly unremarkable. No suspicious osseous lesions. There are mild multilevel degenerative changes in the visualized spine. Redemonstration of mild-to-moderate anterior wedging deformity of T11 vertebral body, similar to the prior study. No significant retropulsion or spinal canal compromise. Review of the MIP images confirms the above findings. IMPRESSION: 1. No embolism to the proximal subsegmental pulmonary artery level. Dilated main pulmonary trunk, which is nonspecific but can be seen with pulmonary artery hypertension. 2. No lung mass, consolidation, pleural effusion or pneumothorax. 3. Multiple other nonacute observations, as described above. Aortic Atherosclerosis (ICD10-I70.0). Electronically Signed   By: Beula Brunswick  M.D.   On: 12/24/2023 15:06   DG Chest Portable 1 View Result Date: 12/24/2023 CLINICAL DATA:  Chest pain EXAM: PORTABLE CHEST 1 VIEW COMPARISON:  Chest x-ray performed November 12, 2023 FINDINGS: The heart is enlarged. Postsurgical changes from sternotomy. No pleural effusion or pneumothorax. IMPRESSION: 1. Enlarged heart with postsurgical changes from sternotomy. Electronically Signed   By: Reagan Camera M.D.   On: 12/24/2023 10:56  Procedures Procedures    Medications Ordered in ED Medications  aspirin  tablet 325 mg (325 mg Oral Given 12/24/23 1423)  methylPREDNISolone sodium succinate (SOLU-MEDROL) 40 mg/mL injection 40 mg (40 mg Intravenous Given 12/24/23 1105)  diphenhydrAMINE  (BENADRYL ) capsule 50 mg ( Oral See Alternative 12/24/23 1330)    Or  diphenhydrAMINE  (BENADRYL ) injection 50 mg (50 mg Intravenous Given 12/24/23 1330)  fentaNYL  (SUBLIMAZE ) injection 25 mcg (25 mcg Intravenous Given 12/24/23 1424)  iohexol  (OMNIPAQUE ) 350 MG/ML injection 100 mL (100 mLs Intravenous Contrast Given 12/24/23 1444)    ED Course/ Medical Decision Making/ A&P Clinical Course as of 12/24/23 1547  Mon Dec 24, 2023  1123 DG Chest Portable 1 View IMPRESSION: 1. Enlarged heart with postsurgical changes from sternotomy.   Electronically Signed   [TY]  1203 Echo 10/15/23:"1. 3D EF 69%. Left ventricular ejection fraction, by estimation, is 55 to  60%. The left ventricle has normal function. The left ventricle has no  regional wall motion abnormalities. Left ventricular diastolic parameters  are indeterminate.  " [TY]  1230 Potassium: 5.1 Re-assuring. [TY]  1517 CT Angio Chest PE W and/or Wo Contrast IMPRESSION: 1. No embolism to the proximal subsegmental pulmonary artery level. Dilated main pulmonary trunk, which is nonspecific but can be seen with pulmonary artery hypertension. 2. No lung mass, consolidation, pleural effusion or pneumothorax. 3. Multiple other nonacute observations, as described  above.  Aortic Atherosclerosis (ICD10-I70.0).   Electronically Signed   By: Beula Brunswick M.D.   On: 12/24/2023 15:06   [TY]  1542 Spoke with Dr. Harrington Limes with cardiology, recommending medicine admission with cardiology consult. [TY]  1546 Spoke with hospitalist to admit patient for CP workup.  [TY]    Clinical Course User Index [TY] Rolinda Climes, DO                                 Medical Decision Making This is a 68 year old female with numerous comorbidities to include ESRD on dialysis, CHF, hypertension hyperlipidemia, COPD who presents to the emergency department for chest pain shortness of breath.  She is afebrile nontachycardic, hemodynamically stable.  Maintaining oxygen  saturation on room air.  Chest x-ray with cardiomegaly, but does not appear to have overt pulmonary edema.  Had an elevated troponin 71-67.  BNP is significantly elevated 8600.  She did have full dialysis session on Saturday and is due for dialysis tomorrow.  Basic metabolic panel with elevated potassium, but with hemolysis.  Repeat with normal potassium.  Hepatic function panel and lipase normal.  Patient continues to have chest pain despite medications.  She does apparently from chart review have history of upper extremity DVT and on Eliquis .  Concern for possible PE.  CTA negative however.  Given elevated troponin will plan for admission for cardiac evaluation.  Amount and/or Complexity of Data Reviewed Labs: ordered. Decision-making details documented in ED Course. Radiology: ordered. Decision-making details documented in ED Course.  Risk OTC drugs. Prescription drug management. Decision regarding hospitalization.         Final Clinical Impression(s) / ED Diagnoses Final diagnoses:  None    Rx / DC Orders ED Discharge Orders     None         Rolinda Climes, DO 12/24/23 1535

## 2023-12-24 NOTE — ED Triage Notes (Signed)
 Sudden onset of left arm, fistula, chest and back pain last night. Patient sleeping when pain started. No cough, intermittent shortness of breath. Last dialysis was Saturday.

## 2023-12-24 NOTE — ED Notes (Signed)
 Explained delay on room assignment to patient. Requesting pain medication for 9/10 chest pain with mild nausea. EDP made aware

## 2023-12-24 NOTE — ED Notes (Signed)
 Patient transported to CT

## 2023-12-25 ENCOUNTER — Encounter (HOSPITAL_COMMUNITY): Payer: Self-pay | Admitting: Family Medicine

## 2023-12-25 ENCOUNTER — Observation Stay (HOSPITAL_COMMUNITY)

## 2023-12-25 DIAGNOSIS — Z79899 Other long term (current) drug therapy: Secondary | ICD-10-CM | POA: Diagnosis not present

## 2023-12-25 DIAGNOSIS — Z794 Long term (current) use of insulin: Secondary | ICD-10-CM | POA: Diagnosis not present

## 2023-12-25 DIAGNOSIS — E785 Hyperlipidemia, unspecified: Secondary | ICD-10-CM

## 2023-12-25 DIAGNOSIS — F418 Other specified anxiety disorders: Secondary | ICD-10-CM | POA: Diagnosis not present

## 2023-12-25 DIAGNOSIS — R072 Precordial pain: Secondary | ICD-10-CM | POA: Diagnosis not present

## 2023-12-25 DIAGNOSIS — E66813 Obesity, class 3: Secondary | ICD-10-CM | POA: Diagnosis not present

## 2023-12-25 DIAGNOSIS — I4819 Other persistent atrial fibrillation: Secondary | ICD-10-CM | POA: Diagnosis not present

## 2023-12-25 DIAGNOSIS — Z7901 Long term (current) use of anticoagulants: Secondary | ICD-10-CM | POA: Diagnosis not present

## 2023-12-25 DIAGNOSIS — M940 Chondrocostal junction syndrome [Tietze]: Secondary | ICD-10-CM | POA: Diagnosis not present

## 2023-12-25 DIAGNOSIS — I2510 Atherosclerotic heart disease of native coronary artery without angina pectoris: Secondary | ICD-10-CM | POA: Diagnosis not present

## 2023-12-25 DIAGNOSIS — R079 Chest pain, unspecified: Secondary | ICD-10-CM | POA: Diagnosis not present

## 2023-12-25 DIAGNOSIS — Z6841 Body Mass Index (BMI) 40.0 and over, adult: Secondary | ICD-10-CM | POA: Diagnosis not present

## 2023-12-25 DIAGNOSIS — I359 Nonrheumatic aortic valve disorder, unspecified: Secondary | ICD-10-CM | POA: Diagnosis not present

## 2023-12-25 DIAGNOSIS — I428 Other cardiomyopathies: Secondary | ICD-10-CM | POA: Diagnosis not present

## 2023-12-25 DIAGNOSIS — I5022 Chronic systolic (congestive) heart failure: Secondary | ICD-10-CM | POA: Diagnosis not present

## 2023-12-25 DIAGNOSIS — J9601 Acute respiratory failure with hypoxia: Secondary | ICD-10-CM | POA: Diagnosis not present

## 2023-12-25 DIAGNOSIS — G4733 Obstructive sleep apnea (adult) (pediatric): Secondary | ICD-10-CM | POA: Diagnosis not present

## 2023-12-25 DIAGNOSIS — I10 Essential (primary) hypertension: Secondary | ICD-10-CM

## 2023-12-25 DIAGNOSIS — I200 Unstable angina: Secondary | ICD-10-CM

## 2023-12-25 DIAGNOSIS — J449 Chronic obstructive pulmonary disease, unspecified: Secondary | ICD-10-CM | POA: Diagnosis not present

## 2023-12-25 DIAGNOSIS — N186 End stage renal disease: Secondary | ICD-10-CM | POA: Diagnosis not present

## 2023-12-25 DIAGNOSIS — Z992 Dependence on renal dialysis: Secondary | ICD-10-CM | POA: Diagnosis not present

## 2023-12-25 LAB — CBC WITH DIFFERENTIAL/PLATELET
Abs Immature Granulocytes: 0.04 10*3/uL (ref 0.00–0.07)
Basophils Absolute: 0 10*3/uL (ref 0.0–0.1)
Basophils Relative: 0 %
Eosinophils Absolute: 0 10*3/uL (ref 0.0–0.5)
Eosinophils Relative: 0 %
HCT: 37.8 % (ref 36.0–46.0)
Hemoglobin: 12.3 g/dL (ref 12.0–15.0)
Immature Granulocytes: 0 %
Lymphocytes Relative: 12 %
Lymphs Abs: 1.1 10*3/uL (ref 0.7–4.0)
MCH: 32.2 pg (ref 26.0–34.0)
MCHC: 32.5 g/dL (ref 30.0–36.0)
MCV: 99 fL (ref 80.0–100.0)
Monocytes Absolute: 0.7 10*3/uL (ref 0.1–1.0)
Monocytes Relative: 8 %
Neutro Abs: 7.2 10*3/uL (ref 1.7–7.7)
Neutrophils Relative %: 80 %
Platelets: 149 10*3/uL — ABNORMAL LOW (ref 150–400)
RBC: 3.82 MIL/uL — ABNORMAL LOW (ref 3.87–5.11)
RDW: 13.7 % (ref 11.5–15.5)
WBC: 9 10*3/uL (ref 4.0–10.5)
nRBC: 0 % (ref 0.0–0.2)

## 2023-12-25 LAB — RENAL FUNCTION PANEL
Albumin: 3.5 g/dL (ref 3.5–5.0)
Anion gap: 16 — ABNORMAL HIGH (ref 5–15)
BUN: 51 mg/dL — ABNORMAL HIGH (ref 8–23)
CO2: 25 mmol/L (ref 22–32)
Calcium: 9.6 mg/dL (ref 8.9–10.3)
Chloride: 94 mmol/L — ABNORMAL LOW (ref 98–111)
Creatinine, Ser: 10.48 mg/dL — ABNORMAL HIGH (ref 0.44–1.00)
GFR, Estimated: 4 mL/min — ABNORMAL LOW (ref >60)
Glucose, Bld: 147 mg/dL — ABNORMAL HIGH (ref 70–99)
Phosphorus: 6.4 mg/dL — ABNORMAL HIGH (ref 2.5–4.6)
Potassium: 6.3 mmol/L (ref 3.5–5.1)
Sodium: 135 mmol/L (ref 135–145)

## 2023-12-25 LAB — HEPATITIS B SURFACE ANTIGEN: Hepatitis B Surface Ag: NONREACTIVE

## 2023-12-25 LAB — HEMOGLOBIN A1C
Hgb A1c MFr Bld: 5 % (ref 4.8–5.6)
Mean Plasma Glucose: 96.8 mg/dL

## 2023-12-25 LAB — TROPONIN I (HIGH SENSITIVITY): Troponin I (High Sensitivity): 6 ng/L (ref <18)

## 2023-12-25 MED ORDER — FENTANYL CITRATE PF 50 MCG/ML IJ SOSY
25.0000 ug | PREFILLED_SYRINGE | INTRAMUSCULAR | Status: DC | PRN
  Administered 2023-12-25: 50 ug via INTRAVENOUS
  Filled 2023-12-25: qty 1

## 2023-12-25 MED ORDER — APIXABAN 5 MG PO TABS
5.0000 mg | ORAL_TABLET | Freq: Two times a day (BID) | ORAL | Status: DC
  Administered 2023-12-25 – 2023-12-27 (×5): 5 mg via ORAL
  Filled 2023-12-25 (×5): qty 1

## 2023-12-25 MED ORDER — ROSUVASTATIN CALCIUM 5 MG PO TABS
10.0000 mg | ORAL_TABLET | Freq: Every day | ORAL | Status: DC
  Administered 2023-12-25 – 2023-12-26 (×2): 10 mg via ORAL
  Filled 2023-12-25 (×2): qty 2

## 2023-12-25 MED ORDER — AMIODARONE HCL 200 MG PO TABS
200.0000 mg | ORAL_TABLET | Freq: Two times a day (BID) | ORAL | Status: DC
  Administered 2023-12-25 – 2023-12-26 (×3): 200 mg via ORAL
  Filled 2023-12-25 (×3): qty 1

## 2023-12-25 MED ORDER — LIDOCAINE 5 % EX PTCH
1.0000 | MEDICATED_PATCH | CUTANEOUS | Status: DC
  Administered 2023-12-25 – 2023-12-27 (×3): 1 via TRANSDERMAL
  Filled 2023-12-25 (×3): qty 1

## 2023-12-25 MED ORDER — IPRATROPIUM-ALBUTEROL 0.5-2.5 (3) MG/3ML IN SOLN: 3.0000 mL | Freq: Four times a day (QID) | RESPIRATORY_TRACT | Status: DC | PRN

## 2023-12-25 MED ORDER — NITROGLYCERIN 0.4 MG SL SUBL
0.4000 mg | SUBLINGUAL_TABLET | Freq: Once | SUBLINGUAL | Status: AC
  Administered 2023-12-25: 0.4 mg via SUBLINGUAL
  Filled 2023-12-25: qty 1

## 2023-12-25 MED ORDER — DULOXETINE HCL 60 MG PO CPEP
60.0000 mg | ORAL_CAPSULE | Freq: Every day | ORAL | Status: DC
  Administered 2023-12-25 – 2023-12-27 (×3): 60 mg via ORAL
  Filled 2023-12-25 (×3): qty 1

## 2023-12-25 MED ORDER — TRIMETHOBENZAMIDE HCL 100 MG/ML IM SOLN: 200.0000 mg | Freq: Four times a day (QID) | INTRAMUSCULAR | Status: DC | PRN

## 2023-12-25 MED ORDER — LIDOCAINE HCL (PF) 1 % IJ SOLN: 5.0000 mL | INTRAMUSCULAR | Status: DC | PRN

## 2023-12-25 MED ORDER — PENTAFLUOROPROP-TETRAFLUOROETH EX AERO: 1.0000 | INHALATION_SPRAY | CUTANEOUS | Status: DC | PRN

## 2023-12-25 MED ORDER — BUSPIRONE HCL 5 MG PO TABS: 5.0000 mg | ORAL_TABLET | Freq: Two times a day (BID) | ORAL | Status: DC

## 2023-12-25 MED ORDER — DIPHENHYDRAMINE HCL 25 MG PO CAPS
50.0000 mg | ORAL_CAPSULE | Freq: Once | ORAL | Status: AC
  Administered 2023-12-26: 50 mg via ORAL
  Filled 2023-12-25: qty 2

## 2023-12-25 MED ORDER — HYDROMORPHONE HCL 1 MG/ML IJ SOLN
0.5000 mg | INTRAMUSCULAR | Status: DC | PRN
  Administered 2023-12-25 – 2023-12-27 (×7): 0.5 mg via INTRAVENOUS
  Filled 2023-12-25 (×7): qty 1

## 2023-12-25 MED ORDER — HEPARIN SODIUM (PORCINE) 1000 UNIT/ML DIALYSIS: 1000.0000 [IU] | INTRAMUSCULAR | Status: DC | PRN

## 2023-12-25 MED ORDER — PREDNISONE 20 MG PO TABS
50.0000 mg | ORAL_TABLET | Freq: Four times a day (QID) | ORAL | Status: AC
  Administered 2023-12-26 (×3): 50 mg via ORAL
  Filled 2023-12-25 (×3): qty 1

## 2023-12-25 MED ORDER — ALTEPLASE 2 MG IJ SOLR: 2.0000 mg | Freq: Once | INTRAMUSCULAR | Status: DC | PRN

## 2023-12-25 MED ORDER — ACETAMINOPHEN 325 MG PO TABS: 650.0000 mg | ORAL_TABLET | ORAL | Status: DC | PRN

## 2023-12-25 MED ORDER — ORAL CARE MOUTH RINSE: 15.0000 mL | OROMUCOSAL | Status: DC | PRN

## 2023-12-25 MED ORDER — FENTANYL CITRATE PF 50 MCG/ML IJ SOSY
50.0000 ug | PREFILLED_SYRINGE | Freq: Once | INTRAMUSCULAR | Status: AC
  Administered 2023-12-25: 50 ug via INTRAVENOUS
  Filled 2023-12-25: qty 1

## 2023-12-25 MED ORDER — BUSPIRONE HCL 5 MG PO TABS
5.0000 mg | ORAL_TABLET | Freq: Two times a day (BID) | ORAL | Status: DC
  Administered 2023-12-25 – 2023-12-27 (×5): 5 mg via ORAL
  Filled 2023-12-25 (×5): qty 1

## 2023-12-25 MED ORDER — ANTICOAGULANT SODIUM CITRATE 4% (200MG/5ML) IV SOLN: 5.0000 mL | Status: DC | PRN

## 2023-12-25 MED ORDER — PANTOPRAZOLE SODIUM 40 MG IV SOLR
40.0000 mg | Freq: Two times a day (BID) | INTRAVENOUS | Status: DC
  Administered 2023-12-25 – 2023-12-26 (×4): 40 mg via INTRAVENOUS
  Filled 2023-12-25 (×5): qty 10

## 2023-12-25 MED ORDER — PANTOPRAZOLE SODIUM 40 MG PO TBEC: 40.0000 mg | DELAYED_RELEASE_TABLET | Freq: Every day | ORAL | Status: DC

## 2023-12-25 MED ORDER — OXYCODONE HCL 5 MG PO TABS: 5.0000 mg | ORAL_TABLET | Freq: Four times a day (QID) | ORAL | Status: DC | PRN

## 2023-12-25 MED ORDER — LIDOCAINE-PRILOCAINE 2.5-2.5 % EX CREA: 1.0000 | TOPICAL_CREAM | CUTANEOUS | Status: DC | PRN

## 2023-12-25 MED ORDER — IPRATROPIUM-ALBUTEROL 0.5-2.5 (3) MG/3ML IN SOLN: 3.0000 mL | Freq: Four times a day (QID) | RESPIRATORY_TRACT | Status: DC

## 2023-12-25 MED ORDER — METOPROLOL TARTRATE 12.5 MG HALF TABLET
12.5000 mg | ORAL_TABLET | Freq: Two times a day (BID) | ORAL | Status: DC
Start: 2023-12-25 — End: 2023-12-27
  Administered 2023-12-25 – 2023-12-27 (×5): 12.5 mg via ORAL
  Filled 2023-12-25 (×6): qty 1

## 2023-12-25 NOTE — Progress Notes (Signed)
   12/25/23 1936  Vitals  Temp 97.9 F (36.6 C)  Pulse Rate 81  Resp 15  BP 106/70  SpO2 100 %  O2 Device Nasal Cannula  Weight 125.3 kg  Type of Weight Post-Dialysis  Oxygen  Therapy  O2 Flow Rate (L/min) 4 L/min  Patient Activity (if Appropriate) In bed  Pulse Oximetry Type Continuous  Oximetry Probe Site Changed No  Post Treatment  Dialyzer Clearance Lightly streaked  Hemodialysis Intake (mL) 0 mL  Liters Processed 90  Fluid Removed (mL) 3000 mL  Tolerated HD Treatment Yes  AVG/AVF Arterial Site Held (minutes) 10 minutes  AVG/AVF Venous Site Held (minutes) 10 minutes   Received patient in bed to unit.  Alert and oriented.  Informed consent signed and in chart.   TX duration:4.0  Patient tolerated well.  Transported back to the room  Alert, without acute distress.  Hand-off given to patient's nurse.   Access used: Luaf Access issues: no complications  Total UF removed: 3000 Medication(s) given: none   [facility]

## 2023-12-25 NOTE — Progress Notes (Addendum)
 Patient Name: [name] Date of Encounter: 12/25/2023  [facility] Cardiologist: Sheryle Donning, MD   Interval Summary  .    Admitted overnight for chest pain.  She feels okay right now without any significant complaints.  Vital Signs .    Vitals:   12/25/23 0000 12/25/23 0141 12/25/23 0438 12/25/23 0736  BP: 126/72 108/85 (!) 153/70 131/84  Pulse: 77 79  71  Resp: (!) 21 19  16   Temp:  97.6 F (36.4 C) 97.6 F (36.4 C) 98.1 F (36.7 C)  TempSrc:  Oral Oral Oral  SpO2: 95% 100%  100%  Weight:  128.3 kg    Height:  5\' 7"  (1.702 m)     No intake or output data in the 24 hours ending 12/25/23 0845    12/25/2023    1:41 AM 12/24/2023    9:55 AM 12/05/2023    7:56 AM  Last 3 Weights  Weight (lbs) 282 lb 13.6 oz 276 lb 273 lb  Weight (kg) 128.3 kg 125.193 kg 123.832 kg      Telemetry/ECG    Appears to be sinus although irregular so may be sinus arrhythmia.  Low voltage P waves and first-degree block make it difficult.- Personally Reviewed  CV Studies    Echocardiogram 10/15/2023 1. 3D EF 69%. Left ventricular ejection fraction, by estimation, is 55 to  60%. The left ventricle has normal function. The left ventricle has no  regional wall motion abnormalities. Left ventricular diastolic parameters  are indeterminate.   2. Right ventricular systolic function is mildly reduced. The right  ventricular size is mildly enlarged.   3. Left atrial size was severely dilated.   4. Right atrial size was severely dilated.   5. Post repair with ring Mild residual MR Mean diastolic gradient 8 mmHg  at HR 68 bpm. The mitral valve has been repaired/replaced. No evidence of  mitral valve regurgitation. No evidence of mitral stenosis.   6. Post repair with ring. The tricuspid valve is has been  repaired/replaced. Tricuspid valve regurgitation is moderate.   7. The aortic valve is tricuspid. Aortic valve regurgitation is not  visualized. No aortic stenosis is  present.   8. The inferior vena cava is normal in size with greater than 50%  respiratory variability, suggesting right atrial pressure of 3 mmHg.    Physical Exam .   GEN: No acute distress.   Neck: + JVD Cardiac: RRR, no murmurs, rubs, or gallops.  Respiratory: Slight crackles. GI: Soft, nontender, non-distended  MS: No edema LUE fistula with palpable thrill and audible bruit   Patient Profile    [name] is a 68 y.o. 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  Admitted for chest pain  Assessment & Plan .     Chest pain Mild nonobstructive CAD by cath 2019 Presenting nonexertional severe chest pain that is reproducible but radiating to her left arm and back associated with shortness of breath.  EKG with chronic appearing ST elevation in lead III otherwise no other significant ischemic changes.  Troponins I negative.  No obvious coronary calcifications on CTA. Seen by overnight fellow, symptoms felt to be atypical, coronary CTA is pending. No aspirin  with Eliquis , continue rosuvastatin  10 mg Lipid panel pending  Heart  failure with recovered EF Most recent echocardiogram shows preserved EF 55 to 60% with mildly reduced RV.  Volume status up but managed by ESRD, has not had dialysis since Saturday.   Followed by advanced heart failure discharge, consider consolidating to Toprol -XL.  Persistent atrial fibrillation Status post maze 12/2020 Recent DCCV 11/2023 Maintaining sinus rhythm here.  Failed Tikosyn .  And not felt to be a candidate for ablation by EP. Continue with Eliquis  5 mg twice daily.  Office note prior to DCCV amiodarone  was decreased to 200 mg daily but on twice daily dosing here.  Will review with MD on intended  regiment. Continue Lopressor  12.5 mg twice daily  ESRD on HD Managed per nephrology  Valvular disease Status post MV and TV repair 12/2020.  Mild residual MR and moderate TR on most recent echocardiogram.  OSA on CPAP Follows with Dr. Micael Adas  For questions or updates, please contact [facility] Please consult [email or web address] for contact info under        Signed, Burnetta Cart, PA-C

## 2023-12-25 NOTE — Procedures (Signed)
 I was present at this dialysis session, have reviewed the session itself and made  appropriate changes Adrian Alba MD  D/w RN.   Seen early in tx - tolerating well.   CP free.  UF goal 4L adjust PRN.   [facility] pager [phone]   12/25/2023, 4:02 PM

## 2023-12-25 NOTE — Care Management Important Message (Signed)
 Important Message  Patient Details  Name: [name] MRN: [id number] Date of Birth: 19-Nov-1955   Important Message Given:        [name] 12/25/2023, 10:20 AM

## 2023-12-25 NOTE — Consult Note (Signed)
 [facility] Renal Consultation Note    Indication for Consultation:  Management of ESRD/hemodialysis; anemia, hypertension/volume and secondary hyperparathyroidism  EAV:[id number], Meade Spencer, NP  HPI: Leslie SHOAFF is a 68 y.o. female with ESRD on HD TTS at [facility]. She has a past medical history significant for afib (on Eliquis ), nonischemic cardiomyopathy with recovered EF, COPD, OSA, depression, and obesity.  Patient presented to [facility] ED yesterday c/o chest pain. She was then transferred to [facility] for further evaluation. Seen and examined patient at bedside. She reports the chest started Sunday night and describes this as a "stabbing/sharp" sensation which radiates down to her left arm and lower back. Today, she reports ongoing 7/10 chest pain. She is not in respiratory distress and denies palpitations and N/V. Cardiology is on board and also at the bedside. CT angiography and repeat troponins were ordered. Her last HD was on 12/22/23 where she received full treatment. She can reach her EDW and has a functional L AVF. Plan for HD sometime today per her usual schedule.  Past Medical History:  Diagnosis Date   Acute on chronic systolic CHF (congestive heart failure) (HCC) 12/21/2020   Allergic rhinitis    Anemia    Arthritis    Asthma    Chronic diastolic CHF (congestive heart failure) (HCC)    Chronic kidney disease    Dialysis Tu/TH/Sa   COPD (chronic obstructive pulmonary disease) (HCC)    COVID-19 03/28/2023   Depression    DM (diabetes mellitus) (HCC)    DVT (deep vein thrombosis) in pregnancy    Gastroenteritis 03/27/2023   GERD (gastroesophageal reflux disease)    HTN (hypertension)    Hyperlipidemia    Obesity    Pneumonia 04/2018   RIGHT LOBE   Sleep apnea    needs to be retested - to get new cpap   Past Surgical History:  Procedure Laterality Date   ABDOMINAL HYSTERECTOMY     AV FISTULA PLACEMENT Left 03/17/2022   Procedure:  LEFT ARM ARTERIOVENOUS (AV) FISTULA CREATION;  Surgeon: Dannis Dy, MD;  Location: [facility] OR;  Service: Vascular;  Laterality: Left;   BIOPSY  01/12/2023   Procedure: BIOPSY;  Surgeon: Alvis Jourdain, MD;  Location: [facility] ENDOSCOPY;  Service: Gastroenterology;;   [place] Im STUDY  11/26/2020   Procedure: BUBBLE STUDY;  Surgeon: Sheryle Donning, MD;  Location: [facility] ENDOSCOPY;  Service: Cardiovascular;;   CARDIOVERSION N/A 05/06/2020   Procedure: CARDIOVERSION;  Surgeon: Sheryle Donning, MD;  Location: [facility] ENDOSCOPY;  Service: Cardiovascular;  Laterality: N/A;   CARDIOVERSION N/A 11/04/2020   Procedure: CARDIOVERSION;  Surgeon: Lenise Quince, MD;  Location: [facility] ENDOSCOPY;  Service: Cardiovascular;  Laterality: N/A;   CARDIOVERSION N/A 02/01/2021   Procedure: CARDIOVERSION;  Surgeon: Mardell Shade, MD;  Location: [facility] ENDOSCOPY;  Service: Cardiovascular;  Laterality: N/A;   CARDIOVERSION N/A 02/09/2021   Procedure: CARDIOVERSION;  Surgeon: Mardell Shade, MD;  Location: [facility] ENDOSCOPY;  Service: Cardiovascular;  Laterality: N/A;   CARDIOVERSION N/A 04/19/2021   Procedure: CARDIOVERSION;  Surgeon: Elmyra Haggard, MD;  Location: [facility] ENDOSCOPY;  Service: Cardiovascular;  Laterality: N/A;   CARDIOVERSION N/A 11/25/2021   Procedure: CARDIOVERSION;  Surgeon: Mardell Shade, MD;  Location: [facility] ENDOSCOPY;  Service: Cardiovascular;  Laterality: N/A;   CARDIOVERSION N/A 01/31/2022   Procedure: CARDIOVERSION;  Surgeon: Mardell Shade, MD;  Location: [facility] ENDOSCOPY;  Service: Cardiovascular;  Laterality: N/A;   CARDIOVERSION N/A 07/23/2023   Procedure: CARDIOVERSION (CATH LAB);  Surgeon: Luana Rumple, MD;  Location: [facility] INVASIVE CV LAB;  Service: Cardiovascular;  Laterality: N/A;   CARDIOVERSION N/A 12/05/2023   Procedure: CARDIOVERSION;  Surgeon: Mardell Shade, MD;  Location: [facility] INVASIVE CV LAB;  Service: Cardiovascular;  Laterality: N/A;   CATARACT EXTRACTION, BILATERAL      CHOLECYSTECTOMY     COLONOSCOPY WITH PROPOFOL  N/A 03/05/2015   Procedure: COLONOSCOPY WITH PROPOFOL ;  Surgeon: Alvis Jourdain, MD;  Location: [facility] ENDOSCOPY;  Service: Endoscopy;  Laterality: N/A;   DIAGNOSTIC LAPAROSCOPY     ESOPHAGOGASTRODUODENOSCOPY (EGD) WITH PROPOFOL  N/A 01/12/2023   Procedure: ESOPHAGOGASTRODUODENOSCOPY (EGD) WITH PROPOFOL ;  Surgeon: Alvis Jourdain, MD;  Location: [facility] ENDOSCOPY;  Service: Gastroenterology;  Laterality: N/A;   FISTULA SUPERFICIALIZATION Left 03/17/2022   Procedure: FISTULA SUPERFICIALIZATION -LEFT;  Surgeon: Dannis Dy, MD;  Location: [facility] OR;  Service: Vascular;  Laterality: Left;   ingrown hallux Left    IR FLUORO GUIDE CV LINE RIGHT  03/14/2022   IR US  GUIDE VASC ACCESS RIGHT  03/14/2022   KNEE SURGERY     LEFT HEART CATH AND CORONARY ANGIOGRAPHY N/A 12/31/2017   Procedure: LEFT HEART CATH AND CORONARY ANGIOGRAPHY;  Surgeon: Chapman Commodore, MD;  Location: [facility] INVASIVE CV LAB;  Service: Cardiovascular;  Laterality: N/A;   MAZE N/A 12/29/2020   Procedure: MAZE;  Surgeon: Zelphia Higashi, MD;  Location: [facility] OR;  Service: Open Heart Surgery;  Laterality: N/A;   MITRAL VALVE REPAIR N/A 12/29/2020   Procedure: MITRAL VALVE REPAIR USING CARBOMEDICS ANNULOFLEX RING SIZE 30;  Surgeon: Zelphia Higashi, MD;  Location: [facility];  Service: Open Heart Surgery;  Laterality: N/A;   REVISON OF ARTERIOVENOUS FISTULA Left 01/05/2023   Procedure: REVISION OF LEFT ARTERIOVENOUS FISTULA WITH INTERPOSITION PTFE GRAFT;  Surgeon: Dannis Dy, MD;  Location: [facility] OR;  Service: Vascular;  Laterality: Left;  ELIQUIS  AND HOLD 48 X HOURS   RIGHT HEART CATH N/A 12/22/2020   Procedure: RIGHT HEART CATH;  Surgeon: Darlis Eisenmenger, MD;  Location: [facility] INVASIVE CV LAB;  Service: Cardiovascular;  Laterality: N/A;   RIGHT HEART CATH N/A 01/31/2021   Procedure: RIGHT HEART CATH;  Surgeon: Mardell Shade, MD;  Location: [facility] INVASIVE CV LAB;  Service: Cardiovascular;  Laterality:  N/A;   RIGHT HEART CATH N/A 07/29/2021   Procedure: RIGHT HEART CATH;  Surgeon: Mardell Shade, MD;  Location: [facility] INVASIVE CV LAB;  Service: Cardiovascular;  Laterality: N/A;   RIGHT HEART CATH N/A 11/24/2021   Procedure: RIGHT HEART CATH;  Surgeon: Mardell Shade, MD;  Location: [facility] INVASIVE CV LAB;  Service: Cardiovascular;  Laterality: N/A;   RIGHT HEART CATH N/A 02/14/2022   Procedure: RIGHT HEART CATH;  Surgeon: Mardell Shade, MD;  Location: [facility] INVASIVE CV LAB;  Service: Cardiovascular;  Laterality: N/A;   RIGHT HEART CATH N/A 02/22/2022   Procedure: RIGHT HEART CATH;  Surgeon: Darlis Eisenmenger, MD;  Location: [facility] INVASIVE CV LAB;  Service: Cardiovascular;  Laterality: N/A;   TEE WITHOUT CARDIOVERSION N/A 11/26/2020   Procedure: TRANSESOPHAGEAL ECHOCARDIOGRAM (TEE);  Surgeon: Sheryle Donning, MD;  Location: [facility] ENDOSCOPY;  Service: Cardiovascular;  Laterality: N/A;   TEE WITHOUT CARDIOVERSION N/A 12/29/2020   Procedure: TRANSESOPHAGEAL ECHOCARDIOGRAM (TEE);  Surgeon: Zelphia Higashi, MD;  Location: [facility] OR;  Service: Open Heart Surgery;  Laterality: N/A;   TEE WITHOUT CARDIOVERSION N/A 01/20/2022   Procedure: TRANSESOPHAGEAL ECHOCARDIOGRAM (TEE);  Surgeon: Mardell Shade, MD;  Location: [facility] ENDOSCOPY;  Service: Cardiovascular;  Laterality: N/A;   TRICUSPID VALVE REPLACEMENT N/A 12/29/2020  Procedure: 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 RING SIZE 34;  Surgeon: Zelphia Higashi, MD;  Location: [facility] OR;  Service: Open Heart Surgery;  Laterality: N/A;   Family History  Problem Relation Age of Onset   Breast cancer Mother    Cancer Mother    Hypertension Mother    Cancer Father    Hypertension Sister    Hypertension Brother    CAD Other    Social History:  reports that she has never smoked. She has never been exposed to tobacco smoke. She has never used smokeless tobacco. She reports that she does not drink alcohol  and does not use drugs. Allergies   Allergen Reactions   Bee Pollen Anaphylaxis and Other (See Comments)    UNCONFIRMED, but IS allergic to bee VENOM   Bee Venom Anaphylaxis   Sulfa Antibiotics Itching and Rash   Chlorhexidine      Unknown rxn.   Iodinated Contrast Media Nausea And Vomiting   Prior to Admission medications   Medication Sig Start Date End Date Taking? Authorizing Provider  acetaminophen  (TYLENOL ) 325 MG tablet Take 2 tablets (650 mg total) by mouth every 6 (six) hours as needed for mild pain or moderate pain. 12/07/21   Arrien, Mauricio Daniel, MD  albuterol  (PROVENTIL  HFA) 108 [phone] Base) MCG/ACT inhaler Inhale 1-2 puffs into the lungs every 6 (six) hours as needed for wheezing or shortness of breath. 10/13/22   Marius Siemens, NP  amiodarone  (PACERONE ) 200 MG tablet Take 1 tablet (200 mg total) by mouth daily. Patient taking differently: Take 200 mg by mouth 2 (two) times daily. 11/23/23   Bensimhon, Rheta Celestine, MD  benzonatate  (TESSALON  PERLES) 100 MG capsule Take 2 capsules (200 mg total) by mouth 2 (two) times daily as needed for cough. 11/15/23   Marius Siemens, NP  busPIRone  (BUSPAR ) 5 MG tablet Take 1 tablet (5 mg total) by mouth 2 (two) times daily. 11/28/23   Marius Siemens, NP  chlorpheniramine-HYDROcodone  (TUSSIONEX) 10-8 MG/5ML Take 5 mLs by mouth every 12 (twelve) hours as needed for cough. 11/14/23   Krishnan, Gokul, MD  diphenhydrAMINE  (BENADRYL ) 25 MG tablet Take 25 mg by mouth every 6 (six) hours as needed for itching. 03/31/22   [provider]  DULoxetine  (CYMBALTA ) 60 MG capsule TAKE ONE CAPSULE (60MG  TOTAL) BY MOUTH DAILY AT 9AM 02/12/23   Marius Siemens, NP  ELIQUIS  5 MG TABS tablet TAKE ONE TABLET (5MG  TOTAL) BY MOUTH TWICE DAILY @9AM -5PM 09/06/23   Bensimhon, Rheta Celestine, MD  EPINEPHrine  0.3 mg/0.3 mL IJ SOAJ injection Inject 0.3 mg into the muscle as needed for anaphylaxis. 06/25/20   Marius Siemens, NP  fluticasone  (FLONASE  ALLERGY RELIEF) 50 MCG/ACT nasal spray Place 2  sprays into both nostrils daily as needed for allergies. 03/31/22   [provider]  insulin  glargine (LANTUS  SOLOSTAR) 100 UNIT/ML Solostar Pen Inject 10 Units into the skin daily. 09/27/23   Marius Siemens, NP  insulin  lispro (HUMALOG  KWIKPEN) 100 UNIT/ML KwikPen For blood sugars 0-150 give 0 units of insulin , 151-200 give 2 units of insulin , 201-250 give 4 units, 251-300 give 6 units, 301-350 give 8 units, 351-400 give 10 units,> 400 give 12 units and call M.D. 09/27/23   Newlin, Enobong, MD  lidocaine -prilocaine  (EMLA ) cream Apply 1 Application topically Every Tuesday,Thursday,and Saturday with dialysis. 06/01/22   [provider]  loratadine  (CLARITIN ) 10 MG tablet Take 10 mg by mouth every morning. 12/05/23   [provider]  metoCLOPramide  (REGLAN ) 5 MG tablet Take 1 tablet (5 mg total) by mouth every 8 (eight) hours as needed for nausea. 07/10/23   Marius Siemens, NP  metoprolol  tartrate (LOPRESSOR ) 25 MG tablet Take 0.5 tablets (12.5 mg total) by mouth 2 (two) times daily. 11/23/23   Bensimhon, Rheta Celestine, MD  midodrine  (PROAMATINE ) 10 MG tablet Take 1 tablet (10 mg total) by mouth See admin instructions. Take 10mg  (1 tablet) by mouth on Tuesday's, Thursday's, and Saturday's. 03/30/23   Daren Eck, DO  Multiple Vitamins-Minerals (MULTIVITAMIN WOMEN PO) Take 1 tablet by mouth daily.    [provider]  nitroGLYCERIN  (NITROSTAT ) 0.4 MG SL tablet Place 1 tablet (0.4 mg total) under the tongue every 5 (five) minutes x 3 doses as needed for chest pain. 11/08/23   Sheryle Donning, MD  pantoprazole  (PROTONIX ) 40 MG tablet Take 1 tablet (40 mg total) by mouth daily. 04/24/23   Marius Siemens, NP  polyethylene glycol powder (MIRALAX ) 17 GM/SCOOP powder Take 17 g by mouth daily as needed (constipation.). 06/26/22   [provider]  rosuvastatin  (CRESTOR ) 10 MG tablet TAKE ONE TABLET BY MOUTH DAILY AT 5PM Patient taking differently: Take 10 mg by  mouth in the morning. 04/13/23   Bensimhon, Rheta Celestine, MD   Current Facility-Administered Medications  Medication Dose Route Frequency Provider Last Rate Last Admin   acetaminophen  (TYLENOL ) tablet 650 mg  650 mg Oral Q4H PRN Opyd, Timothy S, MD       amiodarone  (PACERONE ) tablet 200 mg  200 mg Oral BID Opyd, Timothy S, MD   200 mg at 12/25/23 1046   apixaban  (ELIQUIS ) tablet 5 mg  5 mg Oral BID Opyd, Timothy S, MD   5 mg at 12/25/23 1048   busPIRone  (BUSPAR ) tablet 5 mg  5 mg Oral BID Opyd, Timothy S, MD   5 mg at 12/25/23 0558   [START ON 12/26/2023] diphenhydrAMINE  (BENADRYL ) capsule 50 mg  50 mg Oral Once Hochrein, James, MD       DULoxetine  (CYMBALTA ) DR capsule 60 mg  60 mg Oral Daily Opyd, Timothy S, MD   60 mg at 12/25/23 1046   HYDROmorphone  (DILAUDID ) injection 0.5 mg  0.5 mg Intravenous Q4H PRN Opyd, Timothy S, MD   0.5 mg at 12/25/23 1104   ipratropium-albuterol  (DUONEB) 0.5-2.5 (3) MG/3ML nebulizer solution 3 mL  3 mL Nebulization Q6H PRN Regalado, Belkys A, MD       lidocaine  (LIDODERM ) 5 % 1 patch  1 patch Transdermal Q24H Christena Covert, MD   1 patch at 12/25/23 0559   metoprolol  tartrate (LOPRESSOR ) tablet 12.5 mg  12.5 mg Oral BID Opyd, Timothy S, MD   12.5 mg at 12/25/23 1049   Oral care mouth rinse  15 mL Mouth Rinse PRN Opyd, Santana Cue, MD       oxyCODONE  (Oxy IR/ROXICODONE ) immediate release tablet 5 mg  5 mg Oral Q6H PRN Opyd, Timothy S, MD       pantoprazole  (PROTONIX ) injection 40 mg  40 mg Intravenous Q12H Regalado, Belkys A, MD   40 mg at 12/25/23 1047   [START ON 12/26/2023] predniSONE  (DELTASONE ) tablet 50 mg  50 mg Oral Q6H Hochrein, Royston Cornea, MD       rosuvastatin  (CRESTOR ) tablet 10 mg  10 mg Oral Daily Opyd, Timothy S, MD   10 mg at 12/25/23 1048   trimethobenzamide  (TIGAN ) injection 200 mg  200 mg Intramuscular Q6H PRN Opyd, Santana Cue, MD  Labs: Basic Metabolic Panel: Recent Labs  Lab 12/24/23 1004 12/24/23 1155  NA 138 139  K 5.7* 5.1  CL 96* 97*  CO2 30  31  GLUCOSE 93 102*  BUN 32* 33*  CREATININE 8.70* 8.79*  CALCIUM  10.1 9.9   Liver Function Tests: Recent Labs  Lab 12/24/23 1004  AST 26  ALT 13  ALKPHOS 80  BILITOT 0.5  PROT 7.7  ALBUMIN  4.1   Recent Labs  Lab 12/24/23 1004  LIPASE 17   No results for input(s): "AMMONIA" in the last 168 hours. CBC: Recent Labs  Lab 12/24/23 1004  WBC 3.8*  HGB 11.4*  HCT 35.7*  MCV 100.3*  PLT 134*   Cardiac Enzymes: No results for input(s): "CKTOTAL", "CKMB", "CKMBINDEX", "TROPONINI" in the last 168 hours. CBG: Recent Labs  Lab 12/24/23 2036  GLUCAP 180*   Iron Studies: No results for input(s): "IRON", "TIBC", "TRANSFERRIN", "FERRITIN" in the last 72 hours. Studies/Results: CT Angio Chest PE W and/or Wo Contrast Result Date: 12/24/2023 CLINICAL DATA:  Pulmonary embolism (PE) suspected, high prob. EXAM: CT ANGIOGRAPHY CHEST WITH CONTRAST TECHNIQUE: Multidetector CT imaging of the chest was performed using the standard protocol during bolus administration of intravenous contrast. Multiplanar CT image reconstructions and MIPs were obtained to evaluate the vascular anatomy. RADIATION DOSE REDUCTION: This exam was performed according to the departmental dose-optimization program which includes automated exposure control, adjustment of the mA and/or kV according to patient size and/or use of iterative reconstruction technique. CONTRAST:  OMNIPAQUE  IOHEXOL  350 MG/ML SOLN COMPARISON:  11/09/2023.  The FINDINGS: Cardiovascular: No evidence of embolism to the proximal subsegmental pulmonary artery level. There is dilation of the main pulmonary trunk measuring up to 3.3 cm, which is nonspecific but can be seen with pulmonary artery hypertension. Moderate cardiomegaly. Left atrial appendage closure device noted. Mitral and tricuspid valve annuloplasty noted. No pericardial effusion. No aortic aneurysm. There are mild peripheral atherosclerotic vascular calcifications of thoracic aorta and its  major branches. Mediastinum/Nodes: Visualized thyroid  gland appears grossly unremarkable. No solid / cystic mediastinal masses. The esophagus is nondistended precluding optimal assessment. No axillary, mediastinal or hilar lymphadenopathy by size criteria. Lungs/Pleura: The central tracheo-bronchial tree is patent. There are patchy areas of linear, plate-like atelectasis and/or scarring throughout bilateral lungs. There is mosaic attenuation of bilateral lung bases, consistent with heterogeneous air trapping related to small airways disease. No mass or consolidation. No pleural effusion or pneumothorax. No suspicious lung nodules. Upper Abdomen: Visualized upper abdominal viscera within normal limits. Musculoskeletal: The visualized soft tissues of the chest wall are grossly unremarkable. No suspicious osseous lesions. There are mild multilevel degenerative changes in the visualized spine. Redemonstration of mild-to-moderate anterior wedging deformity of T11 vertebral body, similar to the prior study. No significant retropulsion or spinal canal compromise. Review of the MIP images confirms the above findings. IMPRESSION: 1. No embolism to the proximal subsegmental pulmonary artery level. Dilated main pulmonary trunk, which is nonspecific but can be seen with pulmonary artery hypertension. 2. No lung mass, consolidation, pleural effusion or pneumothorax. 3. Multiple other nonacute observations, as described above. Aortic Atherosclerosis (ICD10-I70.0). Electronically Signed   By: Beula Brunswick M.D.   On: 12/24/2023 15:06   DG Chest Portable 1 View Result Date: 12/24/2023 CLINICAL DATA:  Chest pain EXAM: PORTABLE CHEST 1 VIEW COMPARISON:  Chest x-ray performed November 12, 2023 FINDINGS: The heart is enlarged. Postsurgical changes from sternotomy. No pleural effusion or pneumothorax. IMPRESSION: 1. Enlarged heart with postsurgical changes from sternotomy. Electronically Signed  By: Reagan Camera M.D.   On: 12/24/2023  10:56    ROS: All others negative except those listed in HPI.  General: No weight loss, fever, chills  HEENT: No recent headaches, nasal bleeding,  visual changes, sore throat or dysphagia Neurologic: No dizziness, blackouts, seizures. No recent symptoms of stroke or mini- stroke. No recent episodes of slurred speech, or temporary blindness.  Cardiac: No recent episodes of chest pain/pressure,  shortness of breath at rest or DOE.  Vascular: No history of rest pain in feet, claudication, nonhealing ulcers  or DVT  Pulmonary: No home oxygen ,no cough, hemoptysis, or wheezing  Musculoskeletal: no arthritis, low back pain, or joint pain  Hematologic:No history of hypercoagulable state. No history of easy bleeding. No history of anemia  Gastrointestinal: No hematochezia or melena, No gastroesophageal reflux, no trouble swallowing  Urinary: Anuric or makes small amount of urine, no nurning with urination or frequency   Skin: No rashes or lesions Psychological: No  anxiety or depression   Physical Exam: Vitals:   12/25/23 0438 12/25/23 0736 12/25/23 1049 12/25/23 1115  BP: (!) 153/70 131/84 134/71 128/78  Pulse:  71 69 68  Resp:  16  17  Temp: 97.6 F (36.4 C) 98.1 F (36.7 C)  97.7 F (36.5 C)  TempSrc: Oral Oral  Oral  SpO2:  100%  99%  Weight:      Height:         General: Pleasant; obese; NAD Head: Sclera not icteric  Lungs: Diminished bilateral lower lobes . No wheeze or rhonchi. Breathing is unlabored. Heart: RRR. No murmur, rubs or gallops.  Abdomen: soft and non-tender Lower extremities: No edema LE Neuro: AAOx3. Moves all extremities spontaneously. Dialysis Access: L AVF (+) B/T  Dialysis Orders:  TTS - [facility] 4hrs, BFR 400, DFR 600,  EDW 123.8kg, 3K/ 2.5Ca Heparin  2500 unit bolus  Last Labs: Hgb 11.4,  K 5.1, Ca 9.9, Alb 4.1, BNP 8609, Troponin 71 then 67  Assessment/Plan: Chest pain - Cardiology following: coronary CTA and repeat troponins  ordered. Checking A1c, trial NTG. Lidocaine  patch ordered ESRD -  on HD TTS. Plan for HD today per her usual schedule Afib - Continue Eliquis , amiodarone , and metoprolol  Cardiomyopathy - EF from 09/2023 55-60% Hypertension/volume  - Not severely overloaded on exam today. She can reach EDW but noted she's been leaving over EDW lately. Max UFG is limited in outpatient. Watch here. Anemia of CKD - Hgb at goal. Fe/ESA is not indicated at this time Secondary Hyperparathyroidism -  Ca okay, checking phos in AM. Nutrition - Renal diet with fluid restriction  Jadene Maxwell, NP [facility] 12/25/2023, 1:08 PM

## 2023-12-25 NOTE — Care Management Obs Status (Signed)
 MEDICARE OBSERVATION STATUS NOTIFICATION   Patient Details  Name: [name] MRN: [id number] Date of Birth: [birth year]   Medicare Observation Status Notification Given:  Yes    [name] 12/25/2023, 10:22 AM

## 2023-12-25 NOTE — H&P (Signed)
 History and Physical    [name] OZH:[id number] DOB: [birth year] DOA: 12/24/2023  PCP: Marius Siemens, NP   Patient coming from: Home   Chief Complaint: Chest pain   HPI: [name] is a 68 y.o. female with medical history significant for depression, anxiety, BMI 44, atrial fibrillation on Eliquis , nonischemic cardiomyopathy with recovered EF, COPD, and OSA, now presenting with chest pain.  Patient reports that she went to bed in her usual state the night of 12/23/2023, but woke with severe chest pain.  She describes severe pressure and burning that radiates from the left chest to her left upper extremity and left upper back.  She has had mild nausea and shortness of breath associated with this.  She is unable to identify any alleviating or exacerbating factors.  Pain has been constant since time of onset.  [facility] ED Course: Upon arrival to the ED, patient is found to be afebrile and saturating well on 4 L/min of supplemental oxygen  with normal HR and stable BP.  Labs are most notable for potassium 5.7, bicarbonate 30, BUN 32, troponin 71, and BNP 8609.  CTA chest is negative for PE but notable for dilated main pulmonary trunk.  Cardiology was consulted by the ED physician and the patient was given their 25 mg aspirin , fentanyl  x 2, Benadryl , and Solu-Medrol.  She was transferred to [facility] for admission.  Review of Systems:  All other systems reviewed and apart from HPI, are negative.  Past Medical History:  Diagnosis Date   Acute on chronic systolic CHF (congestive heart failure) (HCC) 12/21/2020   Allergic rhinitis    Anemia    Arthritis    Asthma    Chronic diastolic CHF (congestive heart failure) (HCC)    Chronic kidney disease    Dialysis Tu/TH/Sa   COPD (chronic obstructive pulmonary disease) (HCC)    COVID-19 03/28/2023   Depression    DM (diabetes mellitus) (HCC)    DVT (deep vein thrombosis) in pregnancy     Gastroenteritis 03/27/2023   GERD (gastroesophageal reflux disease)    HTN (hypertension)    Hyperlipidemia    Obesity    Pneumonia 04/2018   RIGHT LOBE   Sleep apnea    needs to be retested - to get new cpap    Past Surgical History:  Procedure Laterality Date   ABDOMINAL HYSTERECTOMY     AV FISTULA PLACEMENT Left 03/17/2022   Procedure: LEFT ARM ARTERIOVENOUS (AV) FISTULA CREATION;  Surgeon: Dannis Dy, MD;  Location: [facility] OR;  Service: Vascular;  Laterality: Left;   BIOPSY  01/12/2023   Procedure: BIOPSY;  Surgeon: Alvis Jourdain, MD;  Location: [facility] ENDOSCOPY;  Service: Gastroenterology;;   Alford Im STUDY  11/26/2020   Procedure: BUBBLE STUDY;  Surgeon: Sheryle Donning, MD;  Location: [facility] ENDOSCOPY;  Service: Cardiovascular;;   CARDIOVERSION N/A 05/06/2020   Procedure: CARDIOVERSION;  Surgeon: Sheryle Donning, MD;  Location: [facility] ENDOSCOPY;  Service: Cardiovascular;  Laterality: N/A;   CARDIOVERSION N/A 11/04/2020   Procedure: CARDIOVERSION;  Surgeon: Lenise Quince, MD;  Location: [facility] ENDOSCOPY;  Service: Cardiovascular;  Laterality: N/A;   CARDIOVERSION N/A 02/01/2021   Procedure: CARDIOVERSION;  Surgeon: Mardell Shade, MD;  Location: [facility] ENDOSCOPY;  Service: Cardiovascular;  Laterality: N/A;   CARDIOVERSION N/A 02/09/2021   Procedure: CARDIOVERSION;  Surgeon: Mardell Shade, MD;  Location: [facility] ENDOSCOPY;  Service: Cardiovascular;  Laterality: N/A;   CARDIOVERSION N/A 04/19/2021   Procedure: CARDIOVERSION;  Surgeon: Elmyra Haggard, MD;  Location: [facility] ENDOSCOPY;  Service: Cardiovascular;  Laterality: N/A;   CARDIOVERSION N/A 11/25/2021   Procedure: CARDIOVERSION;  Surgeon: Mardell Shade, MD;  Location: [facility] ENDOSCOPY;  Service: Cardiovascular;  Laterality: N/A;   CARDIOVERSION N/A 01/31/2022   Procedure: CARDIOVERSION;  Surgeon: Mardell Shade, MD;  Location: [facility] ENDOSCOPY;  Service: Cardiovascular;  Laterality: N/A;   CARDIOVERSION N/A 07/23/2023    Procedure: CARDIOVERSION (CATH LAB);  Surgeon: Luana Rumple, MD;  Location: [facility] INVASIVE CV LAB;  Service: Cardiovascular;  Laterality: N/A;   CARDIOVERSION N/A 12/05/2023   Procedure: CARDIOVERSION;  Surgeon: Mardell Shade, MD;  Location: [facility] INVASIVE CV LAB;  Service: Cardiovascular;  Laterality: N/A;   CATARACT EXTRACTION, BILATERAL     CHOLECYSTECTOMY     COLONOSCOPY WITH PROPOFOL  N/A 03/05/2015   Procedure: COLONOSCOPY WITH PROPOFOL ;  Surgeon: Alvis Jourdain, MD;  Location: [facility] ENDOSCOPY;  Service: Endoscopy;  Laterality: N/A;   DIAGNOSTIC LAPAROSCOPY     ESOPHAGOGASTRODUODENOSCOPY (EGD) WITH PROPOFOL  N/A 01/12/2023   Procedure: ESOPHAGOGASTRODUODENOSCOPY (EGD) WITH PROPOFOL ;  Surgeon: Alvis Jourdain, MD;  Location: [facility] ENDOSCOPY;  Service: Gastroenterology;  Laterality: N/A;   FISTULA SUPERFICIALIZATION Left 03/17/2022   Procedure: FISTULA SUPERFICIALIZATION -LEFT;  Surgeon: Dannis Dy, MD;  Location: [facility] OR;  Service: Vascular;  Laterality: Left;   ingrown hallux Left    IR FLUORO GUIDE CV LINE RIGHT  03/14/2022   IR US  GUIDE VASC ACCESS RIGHT  03/14/2022   KNEE SURGERY     LEFT HEART CATH AND CORONARY ANGIOGRAPHY N/A 12/31/2017   Procedure: LEFT HEART CATH AND CORONARY ANGIOGRAPHY;  Surgeon: Chapman Commodore, MD;  Location: [facility] INVASIVE CV LAB;  Service: Cardiovascular;  Laterality: N/A;   MAZE N/A 12/29/2020   Procedure: MAZE;  Surgeon: Zelphia Higashi, MD;  Location: [facility] OR;  Service: Open Heart Surgery;  Laterality: N/A;   MITRAL VALVE REPAIR N/A 12/29/2020   Procedure: MITRAL VALVE REPAIR USING CARBOMEDICS ANNULOFLEX RING SIZE 30;  Surgeon: Zelphia Higashi, MD;  Location: [facility] OR;  Service: Open Heart Surgery;  Laterality: N/A;   REVISON OF ARTERIOVENOUS FISTULA Left 01/05/2023   Procedure: REVISION OF LEFT ARTERIOVENOUS FISTULA WITH INTERPOSITION PTFE GRAFT;  Surgeon: Dannis Dy, MD;  Location: [facility] OR;  Service: Vascular;  Laterality: Left;  ELIQUIS  AND HOLD 48  X HOURS   RIGHT HEART CATH N/A 12/22/2020   Procedure: RIGHT HEART CATH;  Surgeon: Darlis Eisenmenger, MD;  Location: [facility] INVASIVE CV LAB;  Service: Cardiovascular;  Laterality: N/A;   RIGHT HEART CATH N/A 01/31/2021   Procedure: RIGHT HEART CATH;  Surgeon: Mardell Shade, MD;  Location: [facility] INVASIVE CV LAB;  Service: Cardiovascular;  Laterality: N/A;   RIGHT HEART CATH N/A 07/29/2021   Procedure: RIGHT HEART CATH;  Surgeon: Mardell Shade, MD;  Location: [facility] INVASIVE CV LAB;  Service: Cardiovascular;  Laterality: N/A;   RIGHT HEART CATH N/A 11/24/2021   Procedure: RIGHT HEART CATH;  Surgeon: Mardell Shade, MD;  Location: [facility] INVASIVE CV LAB;  Service: Cardiovascular;  Laterality: N/A;   RIGHT HEART CATH N/A 02/14/2022   Procedure: RIGHT HEART CATH;  Surgeon: Mardell Shade, MD;  Location: [facility] INVASIVE CV LAB;  Service: Cardiovascular;  Laterality: N/A;   RIGHT HEART CATH N/A 02/22/2022   Procedure: RIGHT HEART CATH;  Surgeon: Darlis Eisenmenger, MD;  Location: [facility] INVASIVE CV LAB;  Service: Cardiovascular;  Laterality: N/A;   TEE WITHOUT CARDIOVERSION N/A 11/26/2020   Procedure: TRANSESOPHAGEAL ECHOCARDIOGRAM (TEE);  Surgeon: Sheryle Donning, MD;  Location: [facility] ENDOSCOPY;  Service: Cardiovascular;  Laterality: N/A;   TEE WITHOUT CARDIOVERSION N/A 12/29/2020   Procedure: TRANSESOPHAGEAL ECHOCARDIOGRAM (TEE);  Surgeon: Zelphia Higashi, MD;  Location: [facility] OR;  Service: Open Heart Surgery;  Laterality: N/A;   TEE WITHOUT CARDIOVERSION N/A 01/20/2022   Procedure: TRANSESOPHAGEAL ECHOCARDIOGRAM (TEE);  Surgeon: Mardell Shade, MD;  Location: [facility] ENDOSCOPY;  Service: Cardiovascular;  Laterality: N/A;   TRICUSPID VALVE REPLACEMENT N/A 12/29/2020   Procedure: 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 RING SIZE 34;  Surgeon: Zelphia Higashi, MD;  Location: [facility] OR;  Service: Open Heart Surgery;  Laterality: N/A;    Social History:   reports that she has never smoked. She has  never been exposed to tobacco smoke. She has never used smokeless tobacco. She reports that she does not drink alcohol  and does not use drugs.  Allergies  Allergen Reactions   Bee Pollen Anaphylaxis and Other (See Comments)    UNCONFIRMED, but IS allergic to bee VENOM   Bee Venom Anaphylaxis   Sulfa Antibiotics Itching   Chlorhexidine     Iodinated Contrast Media Nausea And Vomiting    Family History  Problem Relation Age of Onset   Breast cancer Mother    Cancer Mother    Hypertension Mother    Cancer Father    Hypertension Sister    Hypertension Brother    CAD Other      Prior to Admission medications   Medication Sig Start Date End Date Taking? Authorizing Provider  acetaminophen  (TYLENOL ) 325 MG tablet Take 2 tablets (650 mg total) by mouth every 6 (six) hours as needed for mild pain or moderate pain. 12/07/21   Arrien, Mauricio Daniel, MD  albuterol  (PROVENTIL  HFA) 108 [phone] Base) MCG/ACT inhaler Inhale 1-2 puffs into the lungs every 6 (six) hours as needed for wheezing or shortness of breath. 10/13/22   Marius Siemens, NP  amiodarone  (PACERONE ) 200 MG tablet Take 1 tablet (200 mg total) by mouth daily. Patient taking differently: Take 200 mg by mouth 2 (two) times daily. 11/23/23   Bensimhon, Rheta Celestine, MD  benzonatate  (TESSALON  PERLES) 100 MG capsule Take 2 capsules (200 mg total) by mouth 2 (two) times daily as needed for cough. 11/15/23   Marius Siemens, NP  busPIRone  (BUSPAR ) 5 MG tablet Take 1 tablet (5 mg total) by mouth 2 (two) times daily. 11/28/23   Marius Siemens, NP  chlorpheniramine-HYDROcodone  (TUSSIONEX) 10-8 MG/5ML Take 5 mLs by mouth every 12 (twelve) hours as needed for cough. 11/14/23   Krishnan, Gokul, MD  diphenhydrAMINE  (BENADRYL ) 25 MG tablet Take 25 mg by mouth every 6 (six) hours as needed for itching. 03/31/22   [provider]  DULoxetine  (CYMBALTA ) 60 MG capsule TAKE ONE CAPSULE (60MG  TOTAL) BY MOUTH DAILY AT 9AM 02/12/23   Marius Siemens, NP  ELIQUIS  5 MG TABS tablet TAKE ONE TABLET (5MG  TOTAL) BY MOUTH TWICE DAILY @9AM -5PM 09/06/23   Bensimhon, Rheta Celestine, MD  EPINEPHrine  0.3 mg/0.3 mL IJ SOAJ injection Inject 0.3 mg into the muscle as needed for anaphylaxis. 06/25/20   Marius Siemens, NP  fluticasone  (FLONASE  ALLERGY RELIEF) 50 MCG/ACT nasal spray Place 2 sprays into both nostrils daily as needed for allergies. 03/31/22   [provider]  insulin  glargine (LANTUS  SOLOSTAR) 100 UNIT/ML Solostar Pen Inject 10 Units into the skin daily. 09/27/23   Marius Siemens, NP  insulin  lispro (HUMALOG  KWIKPEN) 100 UNIT/ML KwikPen For blood sugars 0-150 give 0  units of insulin , 151-200 give 2 units of insulin , 201-250 give 4 units, 251-300 give 6 units, 301-350 give 8 units, 351-400 give 10 units,> 400 give 12 units and call M.D. 09/27/23   Newlin, Enobong, MD  lidocaine -prilocaine  (EMLA ) cream Apply 1 Application topically Every Tuesday,Thursday,and Saturday with dialysis. 06/01/22   [provider]  metoCLOPramide  (REGLAN ) 5 MG tablet Take 1 tablet (5 mg total) by mouth every 8 (eight) hours as needed for nausea. 07/10/23   Marius Siemens, NP  metoprolol  tartrate (LOPRESSOR ) 25 MG tablet Take 0.5 tablets (12.5 mg total) by mouth 2 (two) times daily. 11/23/23   Bensimhon, Rheta Celestine, MD  midodrine  (PROAMATINE ) 10 MG tablet Take 1 tablet (10 mg total) by mouth See admin instructions. Take 10mg  (1 tablet) by mouth on Tuesday's, Thursday's, and Saturday's. 03/30/23   Daren Eck, DO  Multiple Vitamins-Minerals (MULTIVITAMIN WOMEN PO) Take 1 tablet by mouth daily.    [provider]  nitroGLYCERIN  (NITROSTAT ) 0.4 MG SL tablet Place 1 tablet (0.4 mg total) under the tongue every 5 (five) minutes x 3 doses as needed for chest pain. 11/08/23   Sheryle Donning, MD  pantoprazole  (PROTONIX ) 40 MG tablet Take 1 tablet (40 mg total) by mouth daily. 04/24/23   Marius Siemens, NP  polyethylene glycol powder  (MIRALAX ) 17 GM/SCOOP powder Take 17 g by mouth daily as needed (constipation.). 06/26/22   [provider]  rosuvastatin  (CRESTOR ) 10 MG tablet TAKE ONE TABLET BY MOUTH DAILY AT 5PM Patient taking differently: Take 10 mg by mouth in the morning. 04/13/23   Bensimhon, Rheta Celestine, MD    Physical Exam: Vitals:   12/24/23 2100 12/25/23 0000 12/25/23 0141 12/25/23 0438  BP: 107/78 126/72 108/85 (!) 153/70  Pulse: 71 77 79   Resp: 20 (!) 21 19   Temp: 98 F (36.7 C)  97.6 F (36.4 C) 97.6 F (36.4 C)  TempSrc:   Oral Oral  SpO2: 95% 95% 100%   Weight:   128.3 kg   Height:   5\' 7"  (1.702 m)     Constitutional: NAD, no pallor or diaphoresis  Eyes: PERTLA, lids and conjunctivae normal ENMT: Mucous membranes are moist. Posterior pharynx clear of any exudate or lesions.   Neck: supple, no masses  Respiratory: no wheezing, no crackles. No accessory muscle use.  Cardiovascular: S1 & S2 heard, regular rate and rhythm. Bilateral lower extremity edema.  Abdomen: No tenderness, soft. Bowel sounds active.  Musculoskeletal: no clubbing / cyanosis. No joint deformity upper and lower extremities.   Skin: no significant rashes, lesions, ulcers. Warm, dry, well-perfused. Neurologic: CN 2-12 grossly intact. Moving all extremities. Alert and oriented.  Psychiatric: Anxious. Cooperative.    Labs and Imaging on Admission: I have personally reviewed following labs and imaging studies  CBC: Recent Labs  Lab 12/24/23 1004  WBC 3.8*  HGB 11.4*  HCT 35.7*  MCV 100.3*  PLT 134*   Basic Metabolic Panel: Recent Labs  Lab 12/24/23 1004 12/24/23 1155  NA 138 139  K 5.7* 5.1  CL 96* 97*  CO2 30 31  GLUCOSE 93 102*  BUN 32* 33*  CREATININE 8.70* 8.79*  CALCIUM  10.1 9.9   GFR: Estimated Creatinine Clearance: 8.7 mL/min (A) (by C-G formula based on SCr of 8.79 mg/dL (H)). Liver Function Tests: Recent Labs  Lab 12/24/23 1004  AST 26  ALT 13  ALKPHOS 80  BILITOT 0.5  PROT 7.7   ALBUMIN  4.1   Recent Labs  Lab 12/24/23  1004  LIPASE 17   No results for input(s): "AMMONIA" in the last 168 hours. Coagulation Profile: No results for input(s): "INR", "PROTIME" in the last 168 hours. Cardiac Enzymes: No results for input(s): "CKTOTAL", "CKMB", "CKMBINDEX", "TROPONINI" in the last 168 hours. BNP (last 3 results) Recent Labs    12/24/23 1004  PROBNP 8,609.0*   HbA1C: No results for input(s): "HGBA1C" in the last 72 hours. CBG: Recent Labs  Lab 12/24/23 2036  GLUCAP 180*   Lipid Profile: No results for input(s): "CHOL", "HDL", "LDLCALC", "TRIG", "CHOLHDL", "LDLDIRECT" in the last 72 hours. Thyroid  Function Tests: No results for input(s): "TSH", "T4TOTAL", "FREET4", "T3FREE", "THYROIDAB" in the last 72 hours. Anemia Panel: No results for input(s): "VITAMINB12", "FOLATE", "FERRITIN", "TIBC", "IRON", "RETICCTPCT" in the last 72 hours. Urine analysis:    Component Value Date/Time   COLORURINE AMBER (A) 11/07/2022 1447   APPEARANCEUR CLOUDY (A) 11/07/2022 1447   LABSPEC 1.015 11/07/2022 1447   PHURINE 5.0 11/07/2022 1447   GLUCOSEU NEGATIVE 11/07/2022 1447   HGBUR SMALL (A) 11/07/2022 1447   BILIRUBINUR small (A) 02/21/2023 1748   KETONESUR trace (5) (A) 02/21/2023 1748   KETONESUR NEGATIVE 11/07/2022 1447   PROTEINUR =100 (A) 02/21/2023 1748   PROTEINUR 100 (A) 11/07/2022 1447   UROBILINOGEN 0.2 02/21/2023 1748   UROBILINOGEN 1.0 10/26/2014 0249   NITRITE Negative 02/21/2023 1748   NITRITE NEGATIVE 11/07/2022 1447   LEUKOCYTESUR Negative 02/21/2023 1748   LEUKOCYTESUR TRACE (A) 11/07/2022 1447   Sepsis Labs: @LABRCNTIP (procalcitonin:4,lacticidven:4) )No results found for this or any previous visit (from the past 240 hours).   Radiological Exams on Admission: CT Angio Chest PE W and/or Wo Contrast Result Date: 12/24/2023 CLINICAL DATA:  Pulmonary embolism (PE) suspected, high prob. EXAM: CT ANGIOGRAPHY CHEST WITH CONTRAST TECHNIQUE: Multidetector  CT imaging of the chest was performed using the standard protocol during bolus administration of intravenous contrast. Multiplanar CT image reconstructions and MIPs were obtained to evaluate the vascular anatomy. RADIATION DOSE REDUCTION: This exam was performed according to the departmental dose-optimization program which includes automated exposure control, adjustment of the mA and/or kV according to patient size and/or use of iterative reconstruction technique. CONTRAST:  OMNIPAQUE  IOHEXOL  350 MG/ML SOLN COMPARISON:  11/09/2023.  The FINDINGS: Cardiovascular: No evidence of embolism to the proximal subsegmental pulmonary artery level. There is dilation of the main pulmonary trunk measuring up to 3.3 cm, which is nonspecific but can be seen with pulmonary artery hypertension. Moderate cardiomegaly. Left atrial appendage closure device noted. Mitral and tricuspid valve annuloplasty noted. No pericardial effusion. No aortic aneurysm. There are mild peripheral atherosclerotic vascular calcifications of thoracic aorta and its major branches. Mediastinum/Nodes: Visualized thyroid  gland appears grossly unremarkable. No solid / cystic mediastinal masses. The esophagus is nondistended precluding optimal assessment. No axillary, mediastinal or hilar lymphadenopathy by size criteria. Lungs/Pleura: The central tracheo-bronchial tree is patent. There are patchy areas of linear, plate-like atelectasis and/or scarring throughout bilateral lungs. There is mosaic attenuation of bilateral lung bases, consistent with heterogeneous air trapping related to small airways disease. No mass or consolidation. No pleural effusion or pneumothorax. No suspicious lung nodules. Upper Abdomen: Visualized upper abdominal viscera within normal limits. Musculoskeletal: The visualized soft tissues of the chest wall are grossly unremarkable. No suspicious osseous lesions. There are mild multilevel degenerative changes in the visualized spine.  Redemonstration of mild-to-moderate anterior wedging deformity of T11 vertebral body, similar to the prior study. No significant retropulsion or spinal canal compromise. Review of the MIP images confirms the  above findings. IMPRESSION: 1. No embolism to the proximal subsegmental pulmonary artery level. Dilated main pulmonary trunk, which is nonspecific but can be seen with pulmonary artery hypertension. 2. No lung mass, consolidation, pleural effusion or pneumothorax. 3. Multiple other nonacute observations, as described above. Aortic Atherosclerosis (ICD10-I70.0). Electronically Signed   By: Beula Brunswick M.D.   On: 12/24/2023 15:06   DG Chest Portable 1 View Result Date: 12/24/2023 CLINICAL DATA:  Chest pain EXAM: PORTABLE CHEST 1 VIEW COMPARISON:  Chest x-ray performed November 12, 2023 FINDINGS: The heart is enlarged. Postsurgical changes from sternotomy. No pleural effusion or pneumothorax. IMPRESSION: 1. Enlarged heart with postsurgical changes from sternotomy. Electronically Signed   By: Reagan Camera M.D.   On: 12/24/2023 10:56    EKG: Independently reviewed. Sinus rhythm, 1st degree AV block, RAD.   Assessment/Plan   1. Chest pain - Cardiology consultation is appreciated, planning for coronary CTA, check 3rd troponin, lipids, and A1c, trial NTG, lidocaine  patch  2. ESRD  - Renally-dose medications, restrict fluids, consult nephrology in am for maintenance HD    3. Atrial fibrillation  - Eliquis , amiodarone , metoprolol     4. NICM  - EF improved, was 55-60% in February 2025  - Appears hypervolemic  - Continue beta-blocker, restrict fluids, volume to be managed with dialysis    5. COPD; OSA  - Not in exacerbation  - CPAP while sleeping, as-needed bronchodilators    6. Depression, anxiety  - Cymbalta , Buspar      DVT prophylaxis: Eliquis   Code Status: Full  Level of Care: Level of care: Telemetry Cardiac Family Communication: None present  Disposition Plan:  Patient is from: home   Anticipated d/c is to: Home  Anticipated d/c date is: 12/26/23  Patient currently: Pending additional cardiac testing; will likely need dialysis prior to discharge  Consults called: Cardiology  Admission status: Observation     Walton Guppy, MD [facility]  12/25/2023, 5:10 AM

## 2023-12-25 NOTE — ED Notes (Signed)
 [facility] at bedside

## 2023-12-25 NOTE — Consult Note (Addendum)
 Cardiology Consultation   Patient ID: PRESLEI FREDENBERG MRN: [id number]; DOB: 06-Mar-1956  Admit date: 12/24/2023 Date of Consult: 12/25/2023  PCP:  Marius Siemens, NP   [facility] Providers Cardiologist:  Sheryle Donning, MD  Electrophysiologist:  Boyce Byes, MD       Patient Profile:   [name] is a 68 y.o. 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 Brice Campi.  History of Present Illness:   Ms. Tyminski reports that she was in her usual state of health until Sunday night when she was awoken from her sleep with chest pressure and burning.  The chest pain was left-sided and radiated to her LUE fistula and on the left side of her back.  The pain was severe to the point that it brought her to tears.  It was associated with SOB and lightheadedness.  Nothing in particular makes it better or worse as she can identify.  She states that she has had this type of chest pain previously with multiple admissions for chest pain.  Her CAD workup thus far has been positive for nonobstructive CAD demonstrated on LHC from 2019, but no obstructive disease.  She further endorses chronic nausea and abdominal discomfort, swelling in her feet, and SOB.  She denies fevers, chills, sweats, palpitations, PND, orthopnea, and syncope.  She further denies tobacco, alcohol , illicit drug use.  She reportedly takes her medications as prescribed.    Given her chest pain she presented [facility].  Her initial vital signs were afebrile, BP 101/68, HR 72, RR 15 and satting 99% on RA.  Labs notable for potassium 5.7, creatinine 8.7, WBC 3.8,  hemoglobin 11.4, platelets 134, BNP 8609, and troponins 71 -> 67.  ECG showed NSR with Q waves in leads III and aVF but unchanged from prior.  CT PE was negative for PE and coronary calcifications.  She still reports 9/10 chest pressure/burning.  She was transferred to [facility] for further evaluation.  Cardiology is consulted for evaluation of chest pain.   Past Medical History:  Diagnosis Date   Acute on chronic systolic CHF (congestive heart failure) (HCC) 12/21/2020   Allergic rhinitis    Anemia    Arthritis    Asthma    Chronic diastolic CHF (congestive heart failure) (HCC)    Chronic kidney disease    Dialysis Tu/TH/Sa   COPD (chronic obstructive pulmonary disease) (HCC)    COVID-19 03/28/2023   Depression    DM (diabetes mellitus) (HCC)    DVT (deep vein thrombosis) in pregnancy    Gastroenteritis 03/27/2023   GERD (gastroesophageal reflux disease)    HTN (hypertension)    Hyperlipidemia    Obesity    Pneumonia 04/2018   RIGHT LOBE   Sleep apnea    needs to be retested - to get new cpap    Past Surgical History:  Procedure Laterality Date   ABDOMINAL HYSTERECTOMY     AV FISTULA PLACEMENT Left 03/17/2022   Procedure: LEFT ARM ARTERIOVENOUS (AV) FISTULA CREATION;  Surgeon: Dannis Dy, MD;  Location: [facility] OR;  Service: Vascular;  Laterality: Left;   BIOPSY  01/12/2023   Procedure:  BIOPSY;  Surgeon: Alvis Jourdain, MD;  Location: [facility] ENDOSCOPY;  Service: Gastroenterology;;   Alford Im STUDY  11/26/2020   Procedure: BUBBLE STUDY;  Surgeon: Sheryle Donning, MD;  Location: [facility] ENDOSCOPY;  Service: Cardiovascular;;   CARDIOVERSION N/A 05/06/2020   Procedure: CARDIOVERSION;  Surgeon: Sheryle Donning, MD;  Location: [facility] ENDOSCOPY;  Service: Cardiovascular;  Laterality: N/A;   CARDIOVERSION N/A 11/04/2020   Procedure: CARDIOVERSION;  Surgeon: Lenise Quince, MD;  Location: [facility] ENDOSCOPY;  Service: Cardiovascular;  Laterality: N/A;   CARDIOVERSION N/A 02/01/2021    Procedure: CARDIOVERSION;  Surgeon: Mardell Shade, MD;  Location: [facility] ENDOSCOPY;  Service: Cardiovascular;  Laterality: N/A;   CARDIOVERSION N/A 02/09/2021   Procedure: CARDIOVERSION;  Surgeon: Mardell Shade, MD;  Location: [facility] ENDOSCOPY;  Service: Cardiovascular;  Laterality: N/A;   CARDIOVERSION N/A 04/19/2021   Procedure: CARDIOVERSION;  Surgeon: Elmyra Haggard, MD;  Location: [facility] ENDOSCOPY;  Service: Cardiovascular;  Laterality: N/A;   CARDIOVERSION N/A 11/25/2021   Procedure: CARDIOVERSION;  Surgeon: Mardell Shade, MD;  Location: [facility] ENDOSCOPY;  Service: Cardiovascular;  Laterality: N/A;   CARDIOVERSION N/A 01/31/2022   Procedure: CARDIOVERSION;  Surgeon: Mardell Shade, MD;  Location: [facility] ENDOSCOPY;  Service: Cardiovascular;  Laterality: N/A;   CARDIOVERSION N/A 07/23/2023   Procedure: CARDIOVERSION (CATH LAB);  Surgeon: Luana Rumple, MD;  Location: [facility] INVASIVE CV LAB;  Service: Cardiovascular;  Laterality: N/A;   CARDIOVERSION N/A 12/05/2023   Procedure: CARDIOVERSION;  Surgeon: Mardell Shade, MD;  Location: [facility] INVASIVE CV LAB;  Service: Cardiovascular;  Laterality: N/A;   CATARACT EXTRACTION, BILATERAL     CHOLECYSTECTOMY     COLONOSCOPY WITH PROPOFOL  N/A 03/05/2015   Procedure: COLONOSCOPY WITH PROPOFOL ;  Surgeon: Alvis Jourdain, MD;  Location: [facility] ENDOSCOPY;  Service: Endoscopy;  Laterality: N/A;   DIAGNOSTIC LAPAROSCOPY     ESOPHAGOGASTRODUODENOSCOPY (EGD) WITH PROPOFOL  N/A 01/12/2023   Procedure: ESOPHAGOGASTRODUODENOSCOPY (EGD) WITH PROPOFOL ;  Surgeon: Alvis Jourdain, MD;  Location: [facility] ENDOSCOPY;  Service: Gastroenterology;  Laterality: N/A;   FISTULA SUPERFICIALIZATION Left 03/17/2022   Procedure: FISTULA SUPERFICIALIZATION -LEFT;  Surgeon: Dannis Dy, MD;  Location: [facility] OR;  Service: Vascular;  Laterality: Left;   ingrown hallux Left    IR FLUORO GUIDE CV LINE RIGHT  03/14/2022   IR US  GUIDE VASC ACCESS RIGHT  03/14/2022   KNEE SURGERY     LEFT HEART CATH  AND CORONARY ANGIOGRAPHY N/A 12/31/2017   Procedure: LEFT HEART CATH AND CORONARY ANGIOGRAPHY;  Surgeon: Chapman Commodore, MD;  Location: [facility] INVASIVE CV LAB;  Service: Cardiovascular;  Laterality: N/A;   MAZE N/A 12/29/2020   Procedure: MAZE;  Surgeon: Zelphia Higashi, MD;  Location: [facility] OR;  Service: Open Heart Surgery;  Laterality: N/A;   MITRAL VALVE REPAIR N/A 12/29/2020   Procedure: MITRAL VALVE REPAIR USING CARBOMEDICS ANNULOFLEX RING SIZE 30;  Surgeon: Zelphia Higashi, MD;  Location: [facility] OR;  Service: Open Heart Surgery;  Laterality: N/A;   REVISON OF ARTERIOVENOUS FISTULA Left 01/05/2023   Procedure: REVISION OF LEFT ARTERIOVENOUS FISTULA WITH INTERPOSITION PTFE GRAFT;  Surgeon: Dannis Dy, MD;  Location: [facility] OR;  Service: Vascular;  Laterality: Left;  ELIQUIS  AND HOLD 48 X HOURS   RIGHT HEART CATH N/A 12/22/2020   Procedure: RIGHT HEART CATH;  Surgeon: Darlis Eisenmenger, MD;  Location: [facility] INVASIVE CV LAB;  Service: Cardiovascular;  Laterality: N/A;   RIGHT HEART CATH N/A 01/31/2021   Procedure: RIGHT HEART CATH;  Surgeon: Mardell Shade, MD;  Location: [facility] INVASIVE  CV LAB;  Service: Cardiovascular;  Laterality: N/A;   RIGHT HEART CATH N/A 07/29/2021   Procedure: RIGHT HEART CATH;  Surgeon: Mardell Shade, MD;  Location: [facility] INVASIVE CV LAB;  Service: Cardiovascular;  Laterality: N/A;   RIGHT HEART CATH N/A 11/24/2021   Procedure: RIGHT HEART CATH;  Surgeon: Mardell Shade, MD;  Location: [facility] INVASIVE CV LAB;  Service: Cardiovascular;  Laterality: N/A;   RIGHT HEART CATH N/A 02/14/2022   Procedure: RIGHT HEART CATH;  Surgeon: Mardell Shade, MD;  Location: [facility] INVASIVE CV LAB;  Service: Cardiovascular;  Laterality: N/A;   RIGHT HEART CATH N/A 02/22/2022   Procedure: RIGHT HEART CATH;  Surgeon: Darlis Eisenmenger, MD;  Location: [facility] INVASIVE CV LAB;  Service: Cardiovascular;  Laterality: N/A;   TEE WITHOUT CARDIOVERSION N/A 11/26/2020   Procedure: TRANSESOPHAGEAL  ECHOCARDIOGRAM (TEE);  Surgeon: Sheryle Donning, MD;  Location: [facility] ENDOSCOPY;  Service: Cardiovascular;  Laterality: N/A;   TEE WITHOUT CARDIOVERSION N/A 12/29/2020   Procedure: TRANSESOPHAGEAL ECHOCARDIOGRAM (TEE);  Surgeon: Zelphia Higashi, MD;  Location: [facility] OR;  Service: Open Heart Surgery;  Laterality: N/A;   TEE WITHOUT CARDIOVERSION N/A 01/20/2022   Procedure: TRANSESOPHAGEAL ECHOCARDIOGRAM (TEE);  Surgeon: Mardell Shade, MD;  Location: [facility] ENDOSCOPY;  Service: Cardiovascular;  Laterality: N/A;   TRICUSPID VALVE REPLACEMENT N/A 12/29/2020   Procedure: 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 RING SIZE 34;  Surgeon: Zelphia Higashi, MD;  Location: [facility] OR;  Service: Open Heart Surgery;  Laterality: N/A;     Home Medications:  Prior to Admission medications   Medication Sig Start Date End Date Taking? Authorizing Provider  acetaminophen  (TYLENOL ) 325 MG tablet Take 2 tablets (650 mg total) by mouth every 6 (six) hours as needed for mild pain or moderate pain. 12/07/21   Arrien, Mauricio Daniel, MD  albuterol  (PROVENTIL  HFA) 108 [phone] Base) MCG/ACT inhaler Inhale 1-2 puffs into the lungs every 6 (six) hours as needed for wheezing or shortness of breath. 10/13/22   Marius Siemens, NP  amiodarone  (PACERONE ) 200 MG tablet Take 1 tablet (200 mg total) by mouth daily. Patient taking differently: Take 200 mg by mouth 2 (two) times daily. 11/23/23   Bensimhon, Rheta Celestine, MD  benzonatate  (TESSALON  PERLES) 100 MG capsule Take 2 capsules (200 mg total) by mouth 2 (two) times daily as needed for cough. 11/15/23   Marius Siemens, NP  busPIRone  (BUSPAR ) 5 MG tablet Take 1 tablet (5 mg total) by mouth 2 (two) times daily. 11/28/23   Marius Siemens, NP  chlorpheniramine-HYDROcodone  (TUSSIONEX) 10-8 MG/5ML Take 5 mLs by mouth every 12 (twelve) hours as needed for cough. 11/14/23   Krishnan, Gokul, MD  diphenhydrAMINE  (BENADRYL ) 25 MG tablet Take 25 mg by mouth every 6 (six) hours  as needed for itching. 03/31/22   [provider]  DULoxetine  (CYMBALTA ) 60 MG capsule TAKE ONE CAPSULE (60MG  TOTAL) BY MOUTH DAILY AT 9AM 02/12/23   Marius Siemens, NP  ELIQUIS  5 MG TABS tablet TAKE ONE TABLET (5MG  TOTAL) BY MOUTH TWICE DAILY @9AM -5PM 09/06/23   Bensimhon, Rheta Celestine, MD  EPINEPHrine  0.3 mg/0.3 mL IJ SOAJ injection Inject 0.3 mg into the muscle as needed for anaphylaxis. 06/25/20   Marius Siemens, NP  fluticasone  (FLONASE  ALLERGY RELIEF) 50 MCG/ACT nasal spray Place 2 sprays into both nostrils daily as needed for allergies. 03/31/22   [provider]  insulin  glargine (LANTUS  SOLOSTAR) 100 UNIT/ML Solostar Pen Inject 10 Units into the skin daily.  09/27/23   Marius Siemens, NP  insulin  lispro (HUMALOG  KWIKPEN) 100 UNIT/ML KwikPen For blood sugars 0-150 give 0 units of insulin , 151-200 give 2 units of insulin , 201-250 give 4 units, 251-300 give 6 units, 301-350 give 8 units, 351-400 give 10 units,> 400 give 12 units and call M.D. 09/27/23   Newlin, Enobong, MD  lidocaine -prilocaine  (EMLA ) cream Apply 1 Application topically Every Tuesday,Thursday,and Saturday with dialysis. 06/01/22   [provider]  metoCLOPramide  (REGLAN ) 5 MG tablet Take 1 tablet (5 mg total) by mouth every 8 (eight) hours as needed for nausea. 07/10/23   Marius Siemens, NP  metoprolol  tartrate (LOPRESSOR ) 25 MG tablet Take 0.5 tablets (12.5 mg total) by mouth 2 (two) times daily. 11/23/23   Bensimhon, Rheta Celestine, MD  midodrine  (PROAMATINE ) 10 MG tablet Take 1 tablet (10 mg total) by mouth See admin instructions. Take 10mg  (1 tablet) by mouth on Tuesday's, Thursday's, and Saturday's. 03/30/23   Daren Eck, DO  Multiple Vitamins-Minerals (MULTIVITAMIN WOMEN PO) Take 1 tablet by mouth daily.    [provider]  nitroGLYCERIN  (NITROSTAT ) 0.4 MG SL tablet Place 1 tablet (0.4 mg total) under the tongue every 5 (five) minutes x 3 doses as needed for chest pain. 11/08/23    Sheryle Donning, MD  pantoprazole  (PROTONIX ) 40 MG tablet Take 1 tablet (40 mg total) by mouth daily. 04/24/23   Marius Siemens, NP  polyethylene glycol powder (MIRALAX ) 17 GM/SCOOP powder Take 17 g by mouth daily as needed (constipation.). 06/26/22   [provider]  rosuvastatin  (CRESTOR ) 10 MG tablet TAKE ONE TABLET BY MOUTH DAILY AT 5PM Patient taking differently: Take 10 mg by mouth in the morning. 04/13/23   Bensimhon, Daniel R, MD    Inpatient Medications: Scheduled Meds:  amiodarone   200 mg Oral BID   apixaban   5 mg Oral BID   busPIRone   5 mg Oral BID   DULoxetine   60 mg Oral Daily   lidocaine   1 patch Transdermal Q24H   metoprolol  tartrate  12.5 mg Oral BID   nitroGLYCERIN   0.4 mg Sublingual Once   pantoprazole   40 mg Oral Daily   rosuvastatin   10 mg Oral Daily   Continuous Infusions:  PRN Meds: acetaminophen , HYDROmorphone  (DILAUDID ) injection, mouth rinse, oxyCODONE , trimethobenzamide   Allergies:    Allergies  Allergen Reactions   Bee Pollen Anaphylaxis and Other (See Comments)    UNCONFIRMED, but IS allergic to bee VENOM   Bee Venom Anaphylaxis   Sulfa Antibiotics Itching   Chlorhexidine     Iodinated Contrast Media Nausea And Vomiting    Social History:   Social History   Socioeconomic History   Marital status: Widowed    Spouse name: Not on file   Number of children: 2   Years of education: Not on file   Highest education level: Some college, no degree  Occupational History   Not on file  Tobacco Use   Smoking status: Never    Passive exposure: Never   Smokeless tobacco: Never   Tobacco comments:    Never smoke 10/20/22  Vaping Use   Vaping status: Never Used  Substance and Sexual Activity   Alcohol  use: No   Drug use: No   Sexual activity: Not Currently  Other Topics Concern   Not on file  Social History Narrative   Pt lives in [place] with spouse.  2 grown children.   Previously worked in [employer] at [employer].  Now on  disability   Social Drivers  of Health   Financial Resource Strain: Low Risk  (11/19/2023)   Overall Financial Resource Strain (CARDIA)    Difficulty of Paying Living Expenses: Not hard at all  Food Insecurity: No Food Insecurity (12/25/2023)   Hunger Vital Sign    Worried About Running Out of Food in the Last Year: Never true    Ran Out of Food in the Last Year: Never true  Transportation Needs: No Transportation Needs (12/25/2023)   PRAPARE - Administrator, Civil Service (Medical): No    Lack of Transportation (Non-Medical): No  Physical Activity: Insufficiently Active (06/04/2023)   Exercise Vital Sign    Days of Exercise per Week: 2 days    Minutes of Exercise per Session: 20 min  Stress: No Stress Concern Present (06/04/2023)   [employer] of Occupational Health - Occupational Stress Questionnaire    Feeling of Stress : Only a little  Social Connections: Moderately Isolated (12/25/2023)   Social Connection and Isolation Panel [NHANES]    Frequency of Communication with Friends and Family: More than three times a week    Frequency of Social Gatherings with Friends and Family: More than three times a week    Attends Religious Services: More than 4 times per year    Active Member of [employer] or Organizations: No    Attends Banker Meetings: Never    Marital Status: Widowed  Intimate Partner Violence: Not At Risk (12/25/2023)   Humiliation, Afraid, Rape, and Kick questionnaire    Fear of Current or Ex-Partner: No    Emotionally Abused: No    Physically Abused: No    Sexually Abused: No    Family History:    Family History  Problem Relation Age of Onset   Breast cancer Mother    Cancer Mother    Hypertension Mother    Cancer Father    Hypertension Sister    Hypertension Brother    CAD Other      ROS:  Please see the history of present illness.  All other ROS reviewed and negative.     Physical Exam/Data:   Vitals:   12/24/23 2100 12/25/23  0000 12/25/23 0141 12/25/23 0438  BP: 107/78 126/72 108/85 (!) 153/70  Pulse: 71 77 79   Resp: 20 (!) 21 19   Temp: 98 F (36.7 C)  97.6 F (36.4 C) 97.6 F (36.4 C)  TempSrc:   Oral Oral  SpO2: 95% 95% 100%   Weight:   128.3 kg   Height:   5\' 7"  (1.702 m)    No intake or output data in the 24 hours ending 12/25/23 0453    12/25/2023    1:41 AM 12/24/2023    9:55 AM 12/05/2023    7:56 AM  Last 3 Weights  Weight (lbs) 282 lb 13.6 oz 276 lb 273 lb  Weight (kg) 128.3 kg 125.193 kg 123.832 kg     Body mass index is 44.3 kg/m.  General:  Well nourished, well developed, in no acute distress, very pleasant HEENT: normal, atraumatic, normocephalic Neck: JVD elevated to tragus at 45 degrees Vascular: No carotid bruits; radial pulses 2+ bilaterally, LUE fistula with palpable thrill and audible bruit Cardiac:  normal S1, S2; RRR; murmur heard throughout the precordium likely referred from for fistula, no rubs or gallops, chest pain reproducible to palpation of the left precordium Lungs: Mild bibasilar crackles, no wheezes or rhonchi, good air exchange Abd: soft and nondistended, mild generalized tenderness to palpation, no rebound  or guarding Ext: no edema Musculoskeletal:  No deformities, grossly normal Skin: warm and dry  Neuro:  CNs 2-12 intact, no focal abnormalities noted Psych:  Normal affect   EKG:  The EKG was personally reviewed and demonstrates: NSR with inferior Q waves     Telemetry:  Telemetry was personally reviewed and demonstrates:  NSR  Relevant CV Studies:   TTE 10/15/23:  IMPRESSIONS     1. 3D EF 69%. Left ventricular ejection fraction, by estimation, is 55 to  60%. The left ventricle has normal function. The left ventricle has no  regional wall motion abnormalities. Left ventricular diastolic parameters  are indeterminate.   2. Right ventricular systolic function is mildly reduced. The right  ventricular size is mildly enlarged.   3. Left atrial size was  severely dilated.   4. Right atrial size was severely dilated.   5. Post repair with ring Mild residual MR Mean diastolic gradient 8 mmHg  at HR 68 bpm. The mitral valve has been repaired/replaced. No evidence of  mitral valve regurgitation. No evidence of mitral stenosis.   6. Post repair with ring. The tricuspid valve is has been  repaired/replaced. Tricuspid valve regurgitation is moderate.   7. The aortic valve is tricuspid. Aortic valve regurgitation is not  visualized. No aortic stenosis is present.   8. The inferior vena cava is normal in size with greater than 50%  respiratory variability, suggesting right atrial pressure of 3 mmHg.   RHC 02/22/22:  1. Normal PCWP 2. Mild pulmonary venous hypertension.  3. Mildly elevated RA pressure.  4. Preserved cardiac output.   LHC 12/31/17:  Prox LAD lesion is 10% stenosed. Dist LM to Ost LAD lesion is 10% stenosed. Ost Cx lesion is 15% stenosed. The left ventricular systolic function is normal. LV end diastolic pressure is normal.   NM SPECT 12/27/17:  IMPRESSION: 1. Mild reversibility involving the apical inferior wall. Findings could represent a small area of ischemia.   2. Mild hypokinesia in left ventricle, most prominent along the inferior apex.   3. Left ventricular ejection fraction is 37%, previously 54%.   4. Non invasive risk stratification*: Intermediate  Laboratory Data:  High Sensitivity Troponin:  No results for input(s): "TROPONINIHS" in the last 720 hours.   Chemistry Recent Labs  Lab 12/24/23 1004 12/24/23 1155  NA 138 139  K 5.7* 5.1  CL 96* 97*  CO2 30 31  GLUCOSE 93 102*  BUN 32* 33*  CREATININE 8.70* 8.79*  CALCIUM  10.1 9.9  GFRNONAA  --  5*  GFRAA 5*  --   ANIONGAP 12 11    Recent Labs  Lab 12/24/23 1004  PROT 7.7  ALBUMIN  4.1  AST 26  ALT 13  ALKPHOS 80  BILITOT 0.5   Lipids No results for input(s): "CHOL", "TRIG", "HDL", "LABVLDL", "LDLCALC", "CHOLHDL" in the last 168 hours.   Hematology Recent Labs  Lab 12/24/23 1004  WBC 3.8*  RBC 3.56*  HGB 11.4*  HCT 35.7*  MCV 100.3*  MCH 32.0  MCHC 31.9  RDW 13.8  PLT 134*   Thyroid  No results for input(s): "TSH", "FREET4" in the last 168 hours.  BNP Recent Labs  Lab 12/24/23 1004  PROBNP 8,609.0*    DDimer No results for input(s): "DDIMER" in the last 168 hours.   Radiology/Studies:  CT Angio Chest PE W and/or Wo Contrast Result Date: 12/24/2023 CLINICAL DATA:  Pulmonary embolism (PE) suspected, high prob. EXAM: CT ANGIOGRAPHY CHEST WITH CONTRAST TECHNIQUE: Multidetector CT  imaging of the chest was performed using the standard protocol during bolus administration of intravenous contrast. Multiplanar CT image reconstructions and MIPs were obtained to evaluate the vascular anatomy. RADIATION DOSE REDUCTION: This exam was performed according to the departmental dose-optimization program which includes automated exposure control, adjustment of the mA and/or kV according to patient size and/or use of iterative reconstruction technique. CONTRAST:  OMNIPAQUE  IOHEXOL  350 MG/ML SOLN COMPARISON:  11/09/2023.  The FINDINGS: Cardiovascular: No evidence of embolism to the proximal subsegmental pulmonary artery level. There is dilation of the main pulmonary trunk measuring up to 3.3 cm, which is nonspecific but can be seen with pulmonary artery hypertension. Moderate cardiomegaly. Left atrial appendage closure device noted. Mitral and tricuspid valve annuloplasty noted. No pericardial effusion. No aortic aneurysm. There are mild peripheral atherosclerotic vascular calcifications of thoracic aorta and its major branches. Mediastinum/Nodes: Visualized thyroid  gland appears grossly unremarkable. No solid / cystic mediastinal masses. The esophagus is nondistended precluding optimal assessment. No axillary, mediastinal or hilar lymphadenopathy by size criteria. Lungs/Pleura: The central tracheo-bronchial tree is patent. There are  patchy areas of linear, plate-like atelectasis and/or scarring throughout bilateral lungs. There is mosaic attenuation of bilateral lung bases, consistent with heterogeneous air trapping related to small airways disease. No mass or consolidation. No pleural effusion or pneumothorax. No suspicious lung nodules. Upper Abdomen: Visualized upper abdominal viscera within normal limits. Musculoskeletal: The visualized soft tissues of the chest wall are grossly unremarkable. No suspicious osseous lesions. There are mild multilevel degenerative changes in the visualized spine. Redemonstration of mild-to-moderate anterior wedging deformity of T11 vertebral body, similar to the prior study. No significant retropulsion or spinal canal compromise. Review of the MIP images confirms the above findings. IMPRESSION: 1. No embolism to the proximal subsegmental pulmonary artery level. Dilated main pulmonary trunk, which is nonspecific but can be seen with pulmonary artery hypertension. 2. No lung mass, consolidation, pleural effusion or pneumothorax. 3. Multiple other nonacute observations, as described above. Aortic Atherosclerosis (ICD10-I70.0). Electronically Signed   By: Beula Brunswick M.D.   On: 12/24/2023 15:06   DG Chest Portable 1 View Result Date: 12/24/2023 CLINICAL DATA:  Chest pain EXAM: PORTABLE CHEST 1 VIEW COMPARISON:  Chest x-ray performed November 12, 2023 FINDINGS: The heart is enlarged. Postsurgical changes from sternotomy. No pleural effusion or pneumothorax. IMPRESSION: 1. Enlarged heart with postsurgical changes from sternotomy. Electronically Signed   By: Reagan Camera M.D.   On: 12/24/2023 10:56     Assessment and Plan:   [name] is a 68 y.o. 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 Brice Campi.  #Possible Cardiac Chest Pain #Likely Chronic Myocardial Injury :: Patient presenting with acute onset chest pain that occurred at rest.  There are certainly features of the chest pain that are concerning for a coronary origin and she has multiple risk factors for obstructive CAD.  However, there are also atypical features of her chest pain including reproducibility to palpation of her chest wall.  She has had several episodes of similar chest pain over the years with cardiac workup being negative for obstructive CAD.  Her troponins are elevated but flat likely representing chronic myocardial injury in the setting  of ESRD.  Plus she has no coronary calcium  on her CT PE that I can appreciate.  Nevertheless, given her symptoms and overall risk profile, I think that an ischemic evaluation is warranted.  At this time I favor coronary CT angiography to rule out obstructive CAD.  Will repeat a third troponin as well to ensure that her troponin trend is flat.  Barring a wildly abnormal third troponin, we will tentatively plan for coronary CTA. -Coronary CTA to rule out obstructive CAD -Recheck third troponin to ensure stability -Trial SLNGT -Lidocaine  patch for chest -Tylenol  as needed - Check lipid panel, A1c, LPA  #Persistent AF s/p recent DCCV (12/05/23) on Eliquis  :: Currently in NSR after recent DCCV.  She takes Eliquis  for stroke prevention.  Last dose yesterday evening.  Depending on her CTA results, she may need LHC.  Will not hold Eliquis  at this time given that she had a recent DCCV and her risk of stroke is paradoxically higher during this 1 month post DCCV and that her chest pain seems to be more likely noncardiac in etiology. 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  She appears volume up by elevated JVD; however, she has not had dialysis since Saturday.  Plus she has some residual TR that may explain this finding.  Otherwise she has no other findings suggestive of decompensated heart failure.  GDMT is limited by her ESRD status.  She follows with Dr. Bensimhon as an outpatient. - Continue metoprolol  as above - Strict I's and O's - Fluid removal via dialysis  #Nonobstructive CAD - Coronary and lipid evaluation as above - Continue Eliquis     Risk Assessment/Risk Scores:     TIMI Risk Score for Unstable Angina or Non-ST Elevation MI:   The patient's TIMI risk score is 5, which indicates a 26% risk of all cause mortality, new or recurrent myocardial infarction or need for urgent revascularization in the next 14 days.    CHA2DS2-VASc Score =   5  This indicates a 7.2 % annual risk of stroke. The patient's score is based upon:   Age, CHF, HTN, sex, diabetes       For questions or updates, please contact [facility] Please consult [email or web address] for contact info under    Signed, Christena Covert, MD  12/25/2023 4:53 AM   History and all data above reviewed.  Patient examined.  I agree with the findings as above.   Patient admitted earlier today.  Chest pain.   Plan as stated in the consult note.  CT pending.  [name]  11:28 AM  12/25/2023

## 2023-12-25 NOTE — Progress Notes (Signed)
 PROGRESS NOTE    [name]  ZOX:[id number] DOB: December 07, 1955 DOA: 12/24/2023 PCP: Marius Siemens, NP   Brief Narrative: 68 year old with past medical history significant for depression, anxiety, BMI 44, A-fib on Eliquis , nonischemic cardiomyopathy with recovered ejection fraction, COPD, OSA presents complaining of chest pain.  Wake her up from her sleep, burning sensation radiated to her left upper extremity and left upper back.  Associated with nausea and shortness of breath.  CTA chest negative for PE show dilated main pulmonary trunk.  Cardiology consulted and recommended coronary CTA.   Assessment & Plan:   Principal Problem:   Chest pain Active Problems:   ESRD on dialysis (HCC)   OSA on CPAP   COPD (chronic obstructive pulmonary disease) (HCC)   NICM (nonischemic cardiomyopathy) (HCC)   Anxiety and depression   Persistent atrial fibrillation (HCC)   1-Chest pain: Patient presented with chest pain has multiple risk factor. Reporting elevated Trial of nitroglycerin  Coronary CTA: Pending  Acute hypoxic respiratory failure: Currently on 4 L does not use oxygen  at home -CT angio chest: No PE, dilated main pulmonary trunk. -BNP elevated, mild elevated troponin.  Plan for hemodialysis today. -  ESRD on hemodialysis Due for hemodialysis today  A-fib: Continue Eliquis  amiodarone  metoprolol   Nonischemic cardiomyopathy ejection fraction improved to 55 to 60% by echo 09/2023  Severe MR/TR s/p MV/TV Repair with Residual Mild MR and Moderate TR  - Cardiology consulted and following -Continue metoprolol  -Volume management with hemodialysis  COPD, OSA: Continue CPAP As needed nebulizer  Depression, anxiety: On Cymbalta  and BuSpar   Estimated body mass index is 44.3 kg/m as calculated from the following:   Height as of this encounter: 5\' 7"  (1.702 m).   Weight as of this encounter: 128.3 kg.   DVT prophylaxis: Eliquis  Code Status: Full code Family  Communication: Care discussed with patient Disposition Plan:  Status is: Observation The patient remains OBS appropriate and will d/c before 2 midnights.    Consultants:  Cardiology Pulmonologist  Procedures:    Antimicrobials:    Subjective: She is okay she is feeling a little bit better.  Only on 4 L of oxygen , she does not use oxygen  at home  Objective: Vitals:   12/25/23 0736 12/25/23 1049 12/25/23 1115 12/25/23 1525  BP: 131/84 134/71 128/78   Pulse: 71 69 68   Resp: 16  17 15   Temp: 98.1 F (36.7 C)  97.7 F (36.5 C)   TempSrc: Oral  Oral   SpO2: 100%  99%   Weight:      Height:       No intake or output data in the 24 hours ending 12/25/23 1533 Filed Weights   12/24/23 0955 12/25/23 0141  Weight: 125.2 kg 128.3 kg    Examination:  General exam: Appears calm and comfortable  Respiratory system: BL crackles.  Respiratory effort normal. Cardiovascular system: S1 & S2 heard, RRR.  Gastrointestinal system: Abdomen is nondistended, soft and nontender. No organomegaly or masses felt. Normal bowel sounds heard. Central nervous system: Alert and oriented.  Extremities: Symmetric 5 x 5 power.    Data Reviewed: I have personally reviewed following labs and imaging studies  CBC: Recent Labs  Lab 12/24/23 1004  WBC 3.8*  HGB 11.4*  HCT 35.7*  MCV 100.3*  PLT 134*   Basic Metabolic Panel: Recent Labs  Lab 12/24/23 1004 12/24/23 1155  NA 138 139  K 5.7* 5.1  CL 96* 97*  CO2 30 31  GLUCOSE 93 102*  BUN 32* 33*  CREATININE 8.70* 8.79*  CALCIUM  10.1 9.9   GFR: Estimated Creatinine Clearance: 8.7 mL/min (A) (by C-G formula based on SCr of 8.79 mg/dL (H)). Liver Function Tests: Recent Labs  Lab 12/24/23 1004  AST 26  ALT 13  ALKPHOS 80  BILITOT 0.5  PROT 7.7  ALBUMIN  4.1   Recent Labs  Lab 12/24/23 1004  LIPASE 17   No results for input(s): "AMMONIA" in the last 168 hours. Coagulation Profile: No results for input(s): "INR",  "PROTIME" in the last 168 hours. Cardiac Enzymes: No results for input(s): "CKTOTAL", "CKMB", "CKMBINDEX", "TROPONINI" in the last 168 hours. BNP (last 3 results) Recent Labs    12/24/23 1004  PROBNP 8,609.0*   HbA1C: Recent Labs    12/25/23 0514  HGBA1C 5.0   CBG: Recent Labs  Lab 12/24/23 2036  GLUCAP 180*   Lipid Profile: No results for input(s): "CHOL", "HDL", "LDLCALC", "TRIG", "CHOLHDL", "LDLDIRECT" in the last 72 hours. Thyroid  Function Tests: No results for input(s): "TSH", "T4TOTAL", "FREET4", "T3FREE", "THYROIDAB" in the last 72 hours. Anemia Panel: No results for input(s): "VITAMINB12", "FOLATE", "FERRITIN", "TIBC", "IRON", "RETICCTPCT" in the last 72 hours. Sepsis Labs: No results for input(s): "PROCALCITON", "LATICACIDVEN" in the last 168 hours.  No results found for this or any previous visit (from the past 240 hours).       Radiology Studies: CT Angio Chest PE W and/or Wo Contrast Result Date: 12/24/2023 CLINICAL DATA:  Pulmonary embolism (PE) suspected, high prob. EXAM: CT ANGIOGRAPHY CHEST WITH CONTRAST TECHNIQUE: Multidetector CT imaging of the chest was performed using the standard protocol during bolus administration of intravenous contrast. Multiplanar CT image reconstructions and MIPs were obtained to evaluate the vascular anatomy. RADIATION DOSE REDUCTION: This exam was performed according to the departmental dose-optimization program which includes automated exposure control, adjustment of the mA and/or kV according to patient size and/or use of iterative reconstruction technique. CONTRAST:  OMNIPAQUE  IOHEXOL  350 MG/ML SOLN COMPARISON:  11/09/2023.  The FINDINGS: Cardiovascular: No evidence of embolism to the proximal subsegmental pulmonary artery level. There is dilation of the main pulmonary trunk measuring up to 3.3 cm, which is nonspecific but can be seen with pulmonary artery hypertension. Moderate cardiomegaly. Left atrial appendage closure  device noted. Mitral and tricuspid valve annuloplasty noted. No pericardial effusion. No aortic aneurysm. There are mild peripheral atherosclerotic vascular calcifications of thoracic aorta and its major branches. Mediastinum/Nodes: Visualized thyroid  gland appears grossly unremarkable. No solid / cystic mediastinal masses. The esophagus is nondistended precluding optimal assessment. No axillary, mediastinal or hilar lymphadenopathy by size criteria. Lungs/Pleura: The central tracheo-bronchial tree is patent. There are patchy areas of linear, plate-like atelectasis and/or scarring throughout bilateral lungs. There is mosaic attenuation of bilateral lung bases, consistent with heterogeneous air trapping related to small airways disease. No mass or consolidation. No pleural effusion or pneumothorax. No suspicious lung nodules. Upper Abdomen: Visualized upper abdominal viscera within normal limits. Musculoskeletal: The visualized soft tissues of the chest wall are grossly unremarkable. No suspicious osseous lesions. There are mild multilevel degenerative changes in the visualized spine. Redemonstration of mild-to-moderate anterior wedging deformity of T11 vertebral body, similar to the prior study. No significant retropulsion or spinal canal compromise. Review of the MIP images confirms the above findings. IMPRESSION: 1. No embolism to the proximal subsegmental pulmonary artery level. Dilated main pulmonary trunk, which is nonspecific but can be seen with pulmonary artery hypertension. 2. No lung mass, consolidation, pleural effusion or pneumothorax. 3. Multiple other nonacute observations,  as described above. Aortic Atherosclerosis (ICD10-I70.0). Electronically Signed   By: Beula Brunswick M.D.   On: 12/24/2023 15:06   DG Chest Portable 1 View Result Date: 12/24/2023 CLINICAL DATA:  Chest pain EXAM: PORTABLE CHEST 1 VIEW COMPARISON:  Chest x-ray performed November 12, 2023 FINDINGS: The heart is enlarged. Postsurgical  changes from sternotomy. No pleural effusion or pneumothorax. IMPRESSION: 1. Enlarged heart with postsurgical changes from sternotomy. Electronically Signed   By: Reagan Camera M.D.   On: 12/24/2023 10:56        Scheduled Meds:  amiodarone   200 mg Oral BID   apixaban   5 mg Oral BID   busPIRone   5 mg Oral BID   [START ON 12/26/2023] diphenhydrAMINE   50 mg Oral Once   DULoxetine   60 mg Oral Daily   lidocaine   1 patch Transdermal Q24H   metoprolol  tartrate  12.5 mg Oral BID   pantoprazole  (PROTONIX ) IV  40 mg Intravenous Q12H   [START ON 12/26/2023] predniSONE   50 mg Oral Q6H   rosuvastatin   10 mg Oral Daily   Continuous Infusions:  anticoagulant sodium citrate        LOS: 0 days    Time spent: 35 minutes    Zollie Ellery A Azure Budnick, MD [facility]   If 7PM-7AM, please contact night-coverage [email or web address]  12/25/2023, 3:33 PM

## 2023-12-26 ENCOUNTER — Observation Stay (HOSPITAL_COMMUNITY)

## 2023-12-26 DIAGNOSIS — I2510 Atherosclerotic heart disease of native coronary artery without angina pectoris: Secondary | ICD-10-CM

## 2023-12-26 DIAGNOSIS — I4819 Other persistent atrial fibrillation: Secondary | ICD-10-CM | POA: Diagnosis not present

## 2023-12-26 DIAGNOSIS — E785 Hyperlipidemia, unspecified: Secondary | ICD-10-CM | POA: Diagnosis not present

## 2023-12-26 DIAGNOSIS — R079 Chest pain, unspecified: Secondary | ICD-10-CM | POA: Diagnosis not present

## 2023-12-26 DIAGNOSIS — R072 Precordial pain: Secondary | ICD-10-CM | POA: Diagnosis not present

## 2023-12-26 LAB — RENAL FUNCTION PANEL
Albumin: 3.3 g/dL — ABNORMAL LOW (ref 3.5–5.0)
Anion gap: 11 (ref 5–15)
BUN: 28 mg/dL — ABNORMAL HIGH (ref 8–23)
CO2: 31 mmol/L (ref 22–32)
Calcium: 9 mg/dL (ref 8.9–10.3)
Chloride: 94 mmol/L — ABNORMAL LOW (ref 98–111)
Creatinine, Ser: 6.83 mg/dL — ABNORMAL HIGH (ref 0.44–1.00)
GFR, Estimated: 6 mL/min — ABNORMAL LOW (ref >60)
Glucose, Bld: 139 mg/dL — ABNORMAL HIGH (ref 70–99)
Phosphorus: 6.2 mg/dL — ABNORMAL HIGH (ref 2.5–4.6)
Potassium: 4.8 mmol/L (ref 3.5–5.1)
Sodium: 136 mmol/L (ref 135–145)

## 2023-12-26 LAB — LIPOPROTEIN A (LPA): Lipoprotein (a): 68.9 nmol/L — ABNORMAL HIGH (ref <75.0)

## 2023-12-26 LAB — LIPID PANEL
Cholesterol: 151 mg/dL (ref 0–200)
HDL: 56 mg/dL (ref >40)
LDL Cholesterol: 80 mg/dL (ref 0–99)
Total CHOL/HDL Ratio: 2.7 ratio
Triglycerides: 75 mg/dL (ref <150)
VLDL: 15 mg/dL (ref 0–40)

## 2023-12-26 LAB — GLUCOSE, CAPILLARY
Glucose-Capillary: 197 mg/dL — ABNORMAL HIGH (ref 70–99)
Glucose-Capillary: 229 mg/dL — ABNORMAL HIGH (ref 70–99)
Glucose-Capillary: 248 mg/dL — ABNORMAL HIGH (ref 70–99)
Glucose-Capillary: 215 mg/dL — ABNORMAL HIGH (ref 70–99)

## 2023-12-26 LAB — MRSA NEXT GEN BY PCR, NASAL: MRSA by PCR Next Gen: NOT DETECTED

## 2023-12-26 MED ORDER — IOHEXOL 350 MG/ML SOLN
100.0000 mL | Freq: Once | INTRAVENOUS | Status: AC | PRN
Start: 2023-12-26 — End: 2023-12-26
  Administered 2023-12-26: 100 mL via INTRAVENOUS

## 2023-12-26 MED ORDER — AMIODARONE HCL 200 MG PO TABS
200.0000 mg | ORAL_TABLET | Freq: Every day | ORAL | Status: DC
Start: 2023-12-27 — End: 2023-12-27
  Administered 2023-12-27: 200 mg via ORAL
  Filled 2023-12-26: qty 1

## 2023-12-26 MED ORDER — METOPROLOL TARTRATE 5 MG/5ML IV SOLN
INTRAVENOUS | Status: AC
  Filled 2023-12-26: qty 10

## 2023-12-26 MED ORDER — FERRIC CITRATE 1 GM 210 MG(FE) PO TABS
210.0000 mg | ORAL_TABLET | Freq: Three times a day (TID) | ORAL | Status: DC
  Administered 2023-12-26 – 2023-12-27 (×2): 210 mg via ORAL
  Filled 2023-12-26 (×5): qty 1

## 2023-12-26 MED ORDER — NITROGLYCERIN 0.4 MG SL SUBL
SUBLINGUAL_TABLET | SUBLINGUAL | Status: AC
  Filled 2023-12-26: qty 2

## 2023-12-26 MED ORDER — ATORVASTATIN CALCIUM 40 MG PO TABS
40.0000 mg | ORAL_TABLET | Freq: Every day | ORAL | Status: DC
  Administered 2023-12-27: 40 mg via ORAL
  Filled 2023-12-26: qty 1

## 2023-12-26 NOTE — Plan of Care (Signed)
  Problem: Clinical Measurements: Goal: Respiratory complications will improve Outcome: Progressing Goal: Cardiovascular complication will be avoided Outcome: Progressing   Problem: Nutrition: Goal: Adequate nutrition will be maintained Outcome: Progressing   Problem: Pain Managment: Goal: General experience of comfort will improve and/or be controlled Outcome: Progressing   Problem: Safety: Goal: Ability to remain free from injury will improve Outcome: Progressing   Problem: Cardiac: Goal: Ability to achieve and maintain adequate cardiopulmonary perfusion will improve Outcome: Progressing

## 2023-12-26 NOTE — Progress Notes (Signed)
 PROGRESS NOTE    [name]  UVO:[id number] DOB: 1955-11-14 DOA: 12/24/2023 PCP: Marius Siemens, NP   Brief Narrative:  68 year old with past medical history significant for depression, anxiety, BMI 44, A-fib on Eliquis , nonischemic cardiomyopathy with recovered ejection fraction, COPD, OSA presents complaining of chest pain. Wake her up from her sleep, burning sensation radiated to her left upper extremity and left upper back. Associated with nausea and shortness of breath. CTA chest negative for PE show dilated main pulmonary trunk. Cardiology consulted and recommended coronary CTA.   Assessment & Plan:   Principal Problem:   Chest pain Active Problems:   ESRD on dialysis (HCC)   OSA on CPAP   COPD (chronic obstructive pulmonary disease) (HCC)   NICM (nonischemic cardiomyopathy) (HCC)   Anxiety and depression   Persistent atrial fibrillation (HCC)  1-Chest pain: Chest pain appears to be atypical.  Still complains of chest pain and has significant point tenderness at the left anterior chest and left shoulder.  Troponins negative.  Cardiology on board.  Plan for coronary CT today but her symptoms are likely secondary to costochondritis.  She told me that she was sick with upper respiratory infection 3 weeks ago.  History and clinical examination supports that diagnosis as well.  Acute hypoxic respiratory failure: Currently on room air. -CT angio chest: No PE, dilated main pulmonary trunk. -BNP elevated, mild elevated troponin.  Improved with dialysis. - ESRD on hemodialysis Dialysis per nephrology.   A-fib: Continue Eliquis  amiodarone  metoprolol    Nonischemic cardiomyopathy ejection fraction improved to 55 to 60% by echo 09/2023  Severe MR/TR s/p MV/TV Repair with Residual Mild MR and Moderate TR  - Cardiology consulted and following -Continue metoprolol  -Volume management with hemodialysis   COPD, OSA: Continue CPAP As needed nebulizer   Depression, anxiety:  On Cymbalta  and BuSpar   Morbid obesity class III: BMI 44.  Weight loss and diet modification counseled.  DVT prophylaxis: Eliquis    Code Status: Full Code  Family Communication:  None present at bedside.  Plan of care discussed with patient in length and he/she verbalized understanding and agreed with it.  Status is: Observation The patient will require care spanning > 2 midnights and should be moved to inpatient because: Coronary CT today.  Will be discharged when cleared by cardiology.   Estimated body mass index is 43.26 kg/m as calculated from the following:   Height as of this encounter: 5\' 7"  (1.702 m).   Weight as of this encounter: 125.3 kg.    Nutritional Assessment: Body mass index is 43.26 kg/m.[name] Seen by dietician.  I agree with the assessment and plan as outlined below: Nutrition Status:        . Skin Assessment: I have examined the patient's skin and I agree with the wound assessment as performed by the wound care RN as outlined below:    Consultants:  Nephrology and cardiology  Procedures:  As above  Antimicrobials:  Anti-infectives (From admission, onward)    None         Subjective: Patient seen and examined.  Although she complains of left anterior chest pain radiating to left shoulder but she appears to be extremely comfortable while telling me that.  Had a lengthy discussion with patient that based on the history of URI 3 weeks ago and point tenderness and negative cardiac enzymes, this is likely costochondritis.  Patient continues to believe that this is cardiac in origin.  She would not trust medical providers.  Objective: Vitals:  12/26/23 0443 12/26/23 0812 12/26/23 0840 12/26/23 1200  BP: 95/64 113/79 120/82 99/61  Pulse:  77 78 77  Resp: 18 16  16   Temp: 98 F (36.7 C) 98 F (36.7 C)  97.9 F (36.6 C)  TempSrc: Axillary Oral  Oral  SpO2:    97%  Weight:      Height:        Intake/Output Summary (Last 24 hours) at 12/26/2023  1356 Last data filed at 12/26/2023 1333 Gross per 24 hour  Intake 420 ml  Output 3000 ml  Net -2580 ml   Filed Weights   12/25/23 0141 12/25/23 1525 12/25/23 1936  Weight: 128.3 kg 128.3 kg 125.3 kg    Examination:  General exam: Appears calm and comfortable, obese Respiratory system: Clear to auscultation. Respiratory effort normal.  Point tenderness at the left anterior chest. Cardiovascular system: S1 & S2 heard, RRR. No JVD, murmurs, rubs, gallops or clicks. No pedal edema. Gastrointestinal system: Abdomen is nondistended, soft and nontender. No organomegaly or masses felt. Normal bowel sounds heard. Central nervous system: Alert and oriented. No focal neurological deficits. Extremities: Symmetric 5 x 5 power. Skin: No rashes, lesions or ulcers.  Psychiatry: Judgement and insight appear normal. Mood & affect appropriate.   Data Reviewed: I have personally reviewed following labs and imaging studies  CBC: Recent Labs  Lab 12/24/23 1004 12/25/23 0913  WBC 3.8* 9.0  NEUTROABS  --  7.2  HGB 11.4* 12.3  HCT 35.7* 37.8  MCV 100.3* 99.0  PLT 134* 149*   Basic Metabolic Panel: Recent Labs  Lab 12/24/23 1004 12/24/23 1155 12/25/23 0928 12/26/23 0354  NA 138 139 135 136  K 5.7* 5.1 6.3* 4.8  CL 96* 97* 94* 94*  CO2 30 31 25 31   GLUCOSE 93 102* 147* 139*  BUN 32* 33* 51* 28*  CREATININE 8.70* 8.79* 10.48* 6.83*  CALCIUM  10.1 9.9 9.6 9.0  PHOS  --   --  6.4* 6.2*   GFR: Estimated Creatinine Clearance: 11 mL/min (A) (by C-G formula based on SCr of 6.83 mg/dL (H)). Liver Function Tests: Recent Labs  Lab 12/24/23 1004 12/25/23 0928 12/26/23 0354  AST 26  --   --   ALT 13  --   --   ALKPHOS 80  --   --   BILITOT 0.5  --   --   PROT 7.7  --   --   ALBUMIN  4.1 3.5 3.3*   Recent Labs  Lab 12/24/23 1004  LIPASE 17   No results for input(s): "AMMONIA" in the last 168 hours. Coagulation Profile: No results for input(s): "INR", "PROTIME" in the last 168  hours. Cardiac Enzymes: No results for input(s): "CKTOTAL", "CKMB", "CKMBINDEX", "TROPONINI" in the last 168 hours. BNP (last 3 results) Recent Labs    12/24/23 1004  PROBNP 8,609.0*   HbA1C: Recent Labs    12/25/23 0514  HGBA1C 5.0   CBG: Recent Labs  Lab 12/24/23 2036 12/26/23 0810 12/26/23 1159  GLUCAP 180* 197* 229*   Lipid Profile: Recent Labs    12/26/23 0354  CHOL 151  HDL 56  LDLCALC 80  TRIG 75  CHOLHDL 2.7   Thyroid  Function Tests: No results for input(s): "TSH", "T4TOTAL", "FREET4", "T3FREE", "THYROIDAB" in the last 72 hours. Anemia Panel: No results for input(s): "VITAMINB12", "FOLATE", "FERRITIN", "TIBC", "IRON", "RETICCTPCT" in the last 72 hours. Sepsis Labs: No results for input(s): "PROCALCITON", "LATICACIDVEN" in the last 168 hours.  Recent Results (from the past 240 hours)  MRSA Next Gen by PCR, Nasal     Status: None   Collection Time: 12/25/23  8:21 PM   Specimen: Nasal Mucosa; Nasal Swab  Result Value Ref Range Status   MRSA by PCR Next Gen NOT DETECTED NOT DETECTED Final    Comment: (NOTE) The GeneXpert MRSA Assay (FDA approved for NASAL specimens only), is one component of a comprehensive MRSA colonization surveillance program. It is not intended to diagnose MRSA infection nor to guide or monitor treatment for MRSA infections. Test performance is not FDA approved in patients less than 42 years old. Performed at [facility], [address] [place], [place] [address]      Radiology Studies: CT Angio Chest PE W and/or Wo Contrast Result Date: 12/24/2023 CLINICAL DATA:  Pulmonary embolism (PE) suspected, high prob. EXAM: CT ANGIOGRAPHY CHEST WITH CONTRAST TECHNIQUE: Multidetector CT imaging of the chest was performed using the standard protocol during bolus administration of intravenous contrast. Multiplanar CT image reconstructions and MIPs were obtained to evaluate the vascular anatomy. RADIATION DOSE REDUCTION: This exam was  performed according to the departmental dose-optimization program which includes automated exposure control, adjustment of the mA and/or kV according to patient size and/or use of iterative reconstruction technique. CONTRAST:  OMNIPAQUE  IOHEXOL  350 MG/ML SOLN COMPARISON:  11/09/2023.  The FINDINGS: Cardiovascular: No evidence of embolism to the proximal subsegmental pulmonary artery level. There is dilation of the main pulmonary trunk measuring up to 3.3 cm, which is nonspecific but can be seen with pulmonary artery hypertension. Moderate cardiomegaly. Left atrial appendage closure device noted. Mitral and tricuspid valve annuloplasty noted. No pericardial effusion. No aortic aneurysm. There are mild peripheral atherosclerotic vascular calcifications of thoracic aorta and its major branches. Mediastinum/Nodes: Visualized thyroid  gland appears grossly unremarkable. No solid / cystic mediastinal masses. The esophagus is nondistended precluding optimal assessment. No axillary, mediastinal or hilar lymphadenopathy by size criteria. Lungs/Pleura: The central tracheo-bronchial tree is patent. There are patchy areas of linear, plate-like atelectasis and/or scarring throughout bilateral lungs. There is mosaic attenuation of bilateral lung bases, consistent with heterogeneous air trapping related to small airways disease. No mass or consolidation. No pleural effusion or pneumothorax. No suspicious lung nodules. Upper Abdomen: Visualized upper abdominal viscera within normal limits. Musculoskeletal: The visualized soft tissues of the chest wall are grossly unremarkable. No suspicious osseous lesions. There are mild multilevel degenerative changes in the visualized spine. Redemonstration of mild-to-moderate anterior wedging deformity of T11 vertebral body, similar to the prior study. No significant retropulsion or spinal canal compromise. Review of the MIP images confirms the above findings. IMPRESSION: 1. No embolism to  the proximal subsegmental pulmonary artery level. Dilated main pulmonary trunk, which is nonspecific but can be seen with pulmonary artery hypertension. 2. No lung mass, consolidation, pleural effusion or pneumothorax. 3. Multiple other nonacute observations, as described above. Aortic Atherosclerosis (ICD10-I70.0). Electronically Signed   By: Beula Brunswick M.D.   On: 12/24/2023 15:06    Scheduled Meds:  [START ON 12/27/2023] amiodarone   200 mg Oral Daily   apixaban   5 mg Oral BID   [START ON 12/27/2023] atorvastatin   40 mg Oral Daily   busPIRone   5 mg Oral BID   DULoxetine   60 mg Oral Daily   ferric citrate  210 mg Oral TID WC   lidocaine   1 patch Transdermal Q24H   metoprolol  tartrate  12.5 mg Oral BID   pantoprazole  (PROTONIX ) IV  40 mg Intravenous Q12H   Continuous Infusions:   LOS: 0 days  Modena Andes, MD [facility]  12/26/2023, 1:56 PM   *Please note that this is a verbal dictation therefore any spelling or grammatical errors are due to the "[employer] One" system interpretation.  Please page via Amion and do not message via secure chat for urgent patient care matters. Secure chat can be used for non urgent patient care matters.  How to contact the [facility] Attending or Consulting provider 7A - 7P or covering provider during after hours 7P -7A, for this patient?  Check the care team in [facility] and look for a) attending/consulting [facility] provider listed and b) the [facility] team listed. Page or secure chat 7A-7P. Log into [email or web address] and use [facility] universal password to access. If you do not have the password, please contact the hospital operator. Locate the [facility] provider you are looking for under [facility] and page to a number that you can be directly reached. If you still have difficulty reaching the provider, please page the [facility] (Director on Call) for the Hospitalists listed on amion for assistance.

## 2023-12-26 NOTE — Progress Notes (Signed)
 Pt receives out-pt HD at [facility] [place] on TTS 6:50 am chair time. Will assist as needed.   Olivia Canter Renal Navigator [phone]

## 2023-12-26 NOTE — Progress Notes (Addendum)
 Patient Name: [name] Date of Encounter: 12/26/2023 [facility] Cardiologist: Sheryle Donning, MD   Interval Summary  .    Reports chest pain that is reproducible and persistent.  She has a lidocaine  patch on.  Fortunately did not get coronary CT yesterday.  On schedule today and nurse navigator confirming.  Vital Signs .    Vitals:   12/26/23 0023 12/26/23 0121 12/26/23 0443 12/26/23 0812  BP: 112/73  95/64 113/79  Pulse:    77  Resp: 16  18 16   Temp: 97.9 F (36.6 C)  98 F (36.7 C) 98 F (36.7 C)  TempSrc: Oral  Axillary Oral  SpO2:  100%    Weight:      Height:        Intake/Output Summary (Last 24 hours) at 12/26/2023 0839 Last data filed at 12/25/2023 2015 Gross per 24 hour  Intake 240 ml  Output 3000 ml  Net -2760 ml      12/25/2023    7:36 PM 12/25/2023    3:25 PM 12/25/2023    1:41 AM  Last 3 Weights  Weight (lbs) 276 lb 3.8 oz 282 lb 13.6 oz 282 lb 13.6 oz  Weight (kg) 125.3 kg 128.3 kg 128.3 kg      Telemetry/ECG    Not on telemetry today, nurse will place..- Personally Reviewed  CV Studies    Echocardiogram 10/15/2023 1. 3D EF 69%. Left ventricular ejection fraction, by estimation, is 55 to  60%. The left ventricle has normal function. The left ventricle has no  regional wall motion abnormalities. Left ventricular diastolic parameters  are indeterminate.   2. Right ventricular systolic function is mildly reduced. The right  ventricular size is mildly enlarged.   3. Left atrial size was severely dilated.   4. Right atrial size was severely dilated.   5. Post repair with ring Mild residual MR Mean diastolic gradient 8 mmHg  at HR 68 bpm. The mitral valve has been repaired/replaced. No evidence of  mitral valve regurgitation. No evidence of mitral stenosis.   6. Post repair with ring. The tricuspid valve is has been  repaired/replaced. Tricuspid valve regurgitation is moderate.   7. The aortic valve is tricuspid. Aortic  valve regurgitation is not  visualized. No aortic stenosis is present.   8. The inferior vena cava is normal in size with greater than 50%  respiratory variability, suggesting right atrial pressure of 3 mmHg.    Physical Exam .   GEN: No acute distress.   Neck: + JVD Cardiac: RRR, no murmurs, rubs, or gallops.  Respiratory: +crackles. GI: Soft, nontender, non-distended  MS: No edema LUE fistula with palpable thrill and audible bruit   Patient Profile    [name] is a 68 y.o. 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  Admitted for chest pain  Assessment & Plan .     Chest pain Mild nonobstructive CAD by cath 2019 Presenting nonexertional severe chest pain that is reproducible but radiating to her left arm and back associated with shortness of breath.  EKG with chronic appearing ST elevation in lead III otherwise no other significant ischemic changes.  Troponins I negative.  No obvious coronary calcifications on CTA.  Overall atypical symptoms, coronary CT was  not performed yesterday.  Confirmed that she will get this today. No aspirin  with Eliquis , change rosuvastatin  10 mg to atorvastatin  40 mg as below. LDL is 80.  Hyperlipidemia LDL this admission is 80. LP(a) 68.9. She is on rosuvastatin  10 mg but has underlying ESRD. Transition this to atorvastatin  40 mg, titrate up accordingly and check LDL in 6 to 8 weeks.  Heart failure with recovered EF Most recent echocardiogram shows preserved EF 55 to 60% with mildly reduced RV.  Volume status up but managed by ESRD. Followed by advanced heart failure discharge, on Lopressor  12.5 mg twice daily consider consolidating to Toprol -XL.  Persistent atrial fibrillation Status post maze  12/2020 Recent DCCV 11/2023 Maintaining sinus rhythm here.  Failed Tikosyn .  And not felt to be a candidate for ablation by EP. Continue with Eliquis  5 mg twice daily.  Office note prior to DCCV amiodarone  was decreased to 200 mg daily but on twice daily dosing here.  Will review with MD on intended regiment. Continue Lopressor  12.5 mg twice daily  ESRD on HD Managed per nephrology  Valvular disease Status post MV and TV repair 12/2020.  Mild residual MR and moderate TR on most recent echocardiogram.  OSA on CPAP Follows with Dr. Micael Adas  For questions or updates, please contact [facility] Please consult [email or web address] for contact info under        Signed, Burnetta Cart, PA-C    History and all data above reviewed.  Patient examined.  I agree with the findings as above.  She reports that she is continuing to have chest pain but she has no SOB.  Ambulated in the room.  The patient exam reveals COR:RRR  ,  Lungs: Clear  ,  Abd: Positive bowel sounds, no rebound no guarding, Ext No edema  .  All available labs, radiology testing, previous records reviewed. Agree with documented assessment and plan. Chest pain:  CT today.  She did have dye prophylaxis.  Acute HF with recovered EF:  volume per nephrology.  Meds as listed.  Seems to be euvolemic.  Atrial fib:  Maintaining NSR.  Will be on amiodarone  200 mg daily.    Royston Cornea Jarion Hawthorne  11:19 AM  12/26/2023

## 2023-12-26 NOTE — Progress Notes (Addendum)
 [facility] Progress Note   Subjective:    Seen and examined patient at bedside. Reports chest pain has unchanged. Now on RA. Tolerated yesterday's HD with net UF 3L. Plan for coronary CT today. Next HD 5/8.  Objective Vitals:   12/26/23 0121 12/26/23 0443 12/26/23 0812 12/26/23 0840  BP:  95/64 113/79 120/82  Pulse:   77 78  Resp:  18 16   Temp:  98 F (36.7 C) 98 F (36.7 C)   TempSrc:  Axillary Oral   SpO2: 100%     Weight:      Height:       Physical Exam General: Pleasant; obese; NAD; RA Neck: Cannot appreciate JVD  Heart: S1 and S2; No murmurs, gallops, or rubs Lungs:Diminished bilateral lower lobes/fine crackles . No wheeze or rhonchi. Breathing is unlabored.  Abdomen: Soft and non-tender Extremities:No LE edema Dialysis Access: L AVF (+) B/T   Filed Weights   12/25/23 0141 12/25/23 1525 12/25/23 1936  Weight: 128.3 kg 128.3 kg 125.3 kg    Intake/Output Summary (Last 24 hours) at 12/26/2023 0909 Last data filed at 12/25/2023 2015 Gross per 24 hour  Intake 240 ml  Output 3000 ml  Net -2760 ml    Additional Objective Labs: Basic Metabolic Panel: Recent Labs  Lab 12/24/23 1155 12/25/23 0928 12/26/23 0354  NA 139 135 136  K 5.1 6.3* 4.8  CL 97* 94* 94*  CO2 31 25 31   GLUCOSE 102* 147* 139*  BUN 33* 51* 28*  CREATININE 8.79* 10.48* 6.83*  CALCIUM  9.9 9.6 9.0  PHOS  --  6.4* 6.2*   Liver Function Tests: Recent Labs  Lab 12/24/23 1004 12/25/23 0928 12/26/23 0354  AST 26  --   --   ALT 13  --   --   ALKPHOS 80  --   --   BILITOT 0.5  --   --   PROT 7.7  --   --   ALBUMIN  4.1 3.5 3.3*   Recent Labs  Lab 12/24/23 1004  LIPASE 17   CBC: Recent Labs  Lab 12/24/23 1004 12/25/23 0913  WBC 3.8* 9.0  NEUTROABS  --  7.2  HGB 11.4* 12.3  HCT 35.7* 37.8  MCV 100.3* 99.0  PLT 134* 149*   Blood Culture    Component Value Date/Time   SDES  09/14/2022 0840    BLOOD RIGHT HAND Performed at [facility], [address] [place], [place] [address]    [facility]  09/14/2022 0840    BOTTLES DRAWN AEROBIC AND ANAEROBIC Blood Culture adequate volume Performed at [facility], [address] [place], [place] [address]    CULT  09/14/2022 0840    NO GROWTH 5 DAYS Performed at [facility], [address], [place], [place] [address]    REPTSTATUS 09/19/2022 FINAL 09/14/2022 0840    Cardiac Enzymes: No results for input(s): "CKTOTAL", "CKMB", "CKMBINDEX", "TROPONINI" in the last 168 hours. CBG: Recent Labs  Lab 12/24/23 2036 12/26/23 0810  GLUCAP 180* 197*   Iron Studies: No results for input(s): "IRON", "TIBC", "TRANSFERRIN", "FERRITIN" in the last 72 hours. Lab Results  Component Value Date   INR 1.9 (H) 06/18/2023   INR 2.0 (H) 06/28/2022   INR 2.3 (H) 11/24/2021   Studies/Results: CT Angio Chest PE W and/or Wo Contrast Result Date: 12/24/2023 CLINICAL DATA:  Pulmonary embolism (PE) suspected, high prob. EXAM: CT ANGIOGRAPHY CHEST WITH CONTRAST TECHNIQUE: Multidetector CT imaging of the chest was performed  using the standard protocol during bolus administration of intravenous contrast. Multiplanar CT image reconstructions and MIPs were obtained to evaluate the vascular anatomy. RADIATION DOSE REDUCTION: This exam was performed according to the departmental dose-optimization program which includes automated exposure control, adjustment of the mA and/or kV according to patient size and/or use of iterative reconstruction technique. CONTRAST:  OMNIPAQUE  IOHEXOL  350 MG/ML SOLN COMPARISON:  11/09/2023.  The FINDINGS: Cardiovascular: No evidence of embolism to the proximal subsegmental pulmonary artery level. There is dilation of the main pulmonary trunk measuring up to 3.3 cm, which is nonspecific but can be seen with pulmonary artery hypertension. Moderate cardiomegaly. Left atrial appendage closure device noted. Mitral and tricuspid valve annuloplasty noted. No  pericardial effusion. No aortic aneurysm. There are mild peripheral atherosclerotic vascular calcifications of thoracic aorta and its major branches. Mediastinum/Nodes: Visualized thyroid  gland appears grossly unremarkable. No solid / cystic mediastinal masses. The esophagus is nondistended precluding optimal assessment. No axillary, mediastinal or hilar lymphadenopathy by size criteria. Lungs/Pleura: The central tracheo-bronchial tree is patent. There are patchy areas of linear, plate-like atelectasis and/or scarring throughout bilateral lungs. There is mosaic attenuation of bilateral lung bases, consistent with heterogeneous air trapping related to small airways disease. No mass or consolidation. No pleural effusion or pneumothorax. No suspicious lung nodules. Upper Abdomen: Visualized upper abdominal viscera within normal limits. Musculoskeletal: The visualized soft tissues of the chest wall are grossly unremarkable. No suspicious osseous lesions. There are mild multilevel degenerative changes in the visualized spine. Redemonstration of mild-to-moderate anterior wedging deformity of T11 vertebral body, similar to the prior study. No significant retropulsion or spinal canal compromise. Review of the MIP images confirms the above findings. IMPRESSION: 1. No embolism to the proximal subsegmental pulmonary artery level. Dilated main pulmonary trunk, which is nonspecific but can be seen with pulmonary artery hypertension. 2. No lung mass, consolidation, pleural effusion or pneumothorax. 3. Multiple other nonacute observations, as described above. Aortic Atherosclerosis (ICD10-I70.0). Electronically Signed   By: Beula Brunswick M.D.   On: 12/24/2023 15:06   DG Chest Portable 1 View Result Date: 12/24/2023 CLINICAL DATA:  Chest pain EXAM: PORTABLE CHEST 1 VIEW COMPARISON:  Chest x-ray performed November 12, 2023 FINDINGS: The heart is enlarged. Postsurgical changes from sternotomy. No pleural effusion or pneumothorax.  IMPRESSION: 1. Enlarged heart with postsurgical changes from sternotomy. Electronically Signed   By: Reagan Camera M.D.   On: 12/24/2023 10:56    Medications:   amiodarone   200 mg Oral BID   apixaban   5 mg Oral BID   busPIRone   5 mg Oral BID   diphenhydrAMINE   50 mg Oral Once   DULoxetine   60 mg Oral Daily   lidocaine   1 patch Transdermal Q24H   metoprolol  tartrate  12.5 mg Oral BID   pantoprazole  (PROTONIX ) IV  40 mg Intravenous Q12H   predniSONE   50 mg Oral Q6H   rosuvastatin   10 mg Oral Daily    Dialysis Orders: TTS - [facility] 4hrs, BFR 400, DFR 600,  EDW 123.8kg, 3K/ 2.5Ca Heparin  2500 unit bolus  Assessment/Plan: Chest pain - Cardiology following: coronary CTA and repeat troponins ordered. Checking A1c, trial NTG. Lidocaine  patch on ESRD -  on HD TTS. Removed 3L off yesterday. Next HD 5/8. Afib - Continue Eliquis , amiodarone , and metoprolol  Cardiomyopathy - EF from 09/2023 55-60% Hypertension/volume  - Not severely overloaded on exam today. She can reach EDW but noted she's been leaving over EDW lately. Max UFG is limited in outpatient. Watch here.  Anemia of CKD - Hgb at goal. Fe/ESA is not indicated at this time Secondary Hyperparathyroidism -  Ca okay but phos is up. Spoke to outpatient RD. Patient noted to have intolerances with other binders causing GI issues. Will try Auryxia 1 tab with meals here. Watch Hgb levels. Nutrition - Renal diet with fluid restriction  Jadene Maxwell, NP [facility] 12/26/2023,9:09 AM  LOS: 0 days

## 2023-12-27 ENCOUNTER — Other Ambulatory Visit (HOSPITAL_COMMUNITY): Payer: Self-pay

## 2023-12-27 DIAGNOSIS — R079 Chest pain, unspecified: Secondary | ICD-10-CM | POA: Diagnosis not present

## 2023-12-27 DIAGNOSIS — M940 Chondrocostal junction syndrome [Tietze]: Secondary | ICD-10-CM | POA: Insufficient documentation

## 2023-12-27 DIAGNOSIS — R072 Precordial pain: Secondary | ICD-10-CM

## 2023-12-27 DIAGNOSIS — E785 Hyperlipidemia, unspecified: Secondary | ICD-10-CM | POA: Diagnosis not present

## 2023-12-27 LAB — CBC
HCT: 34.6 % — ABNORMAL LOW (ref 36.0–46.0)
Hemoglobin: 11.3 g/dL — ABNORMAL LOW (ref 12.0–15.0)
MCH: 31.9 pg (ref 26.0–34.0)
MCHC: 32.7 g/dL (ref 30.0–36.0)
MCV: 97.7 fL (ref 80.0–100.0)
Platelets: 159 10*3/uL (ref 150–400)
RBC: 3.54 MIL/uL — ABNORMAL LOW (ref 3.87–5.11)
RDW: 13.4 % (ref 11.5–15.5)
WBC: 11 10*3/uL — ABNORMAL HIGH (ref 4.0–10.5)
nRBC: 0 % (ref 0.0–0.2)

## 2023-12-27 LAB — RENAL FUNCTION PANEL
Albumin: 3.3 g/dL — ABNORMAL LOW (ref 3.5–5.0)
Anion gap: 14 (ref 5–15)
BUN: 59 mg/dL — ABNORMAL HIGH (ref 8–23)
CO2: 24 mmol/L (ref 22–32)
Calcium: 9.2 mg/dL (ref 8.9–10.3)
Chloride: 93 mmol/L — ABNORMAL LOW (ref 98–111)
Creatinine, Ser: 9.5 mg/dL — ABNORMAL HIGH (ref 0.44–1.00)
GFR, Estimated: 4 mL/min — ABNORMAL LOW (ref >60)
Glucose, Bld: 216 mg/dL — ABNORMAL HIGH (ref 70–99)
Phosphorus: 5.5 mg/dL — ABNORMAL HIGH (ref 2.5–4.6)
Potassium: 5 mmol/L (ref 3.5–5.1)
Sodium: 131 mmol/L — ABNORMAL LOW (ref 135–145)

## 2023-12-27 LAB — GLUCOSE, CAPILLARY
Glucose-Capillary: 197 mg/dL — ABNORMAL HIGH (ref 70–99)
Glucose-Capillary: 125 mg/dL — ABNORMAL HIGH (ref 70–99)

## 2023-12-27 LAB — HEPATITIS B SURFACE ANTIBODY, QUANTITATIVE: Hep B S AB Quant (Post): 87.1 m[IU]/mL

## 2023-12-27 MED ORDER — HEPARIN SODIUM (PORCINE) 1000 UNIT/ML IJ SOLN
2500.0000 [IU] | Freq: Once | INTRAMUSCULAR | Status: AC
  Administered 2023-12-27: 2500 [IU] via INTRAVENOUS

## 2023-12-27 MED ORDER — INSULIN ASPART 100 UNIT/ML IJ SOLN: 0.0000 [IU] | Freq: Three times a day (TID) | INTRAMUSCULAR | Status: DC

## 2023-12-27 MED ORDER — MIDODRINE HCL 5 MG PO TABS
10.0000 mg | ORAL_TABLET | Freq: Once | ORAL | Status: AC
  Administered 2023-12-27: 10 mg via ORAL

## 2023-12-27 MED ORDER — PENTAFLUOROPROP-TETRAFLUOROETH EX AERO: 1.0000 | INHALATION_SPRAY | CUTANEOUS | Status: DC | PRN

## 2023-12-27 MED ORDER — MIDODRINE HCL 5 MG PO TABS
ORAL_TABLET | ORAL | Status: AC
  Filled 2023-12-27: qty 1

## 2023-12-27 MED ORDER — LIDOCAINE HCL (PF) 1 % IJ SOLN: 5.0000 mL | INTRAMUSCULAR | Status: DC | PRN

## 2023-12-27 MED ORDER — INSULIN ASPART 100 UNIT/ML IJ SOLN: 0.0000 [IU] | Freq: Every day | INTRAMUSCULAR | Status: DC

## 2023-12-27 MED ORDER — LIDOCAINE-PRILOCAINE 2.5-2.5 % EX CREA: 1.0000 | TOPICAL_CREAM | CUTANEOUS | Status: DC | PRN

## 2023-12-27 MED ORDER — ATORVASTATIN CALCIUM 40 MG PO TABS
40.0000 mg | ORAL_TABLET | Freq: Every day | ORAL | 0 refills | Status: DC
  Filled 2023-12-27: qty 30, 30d supply, fill #0

## 2023-12-27 MED ORDER — HEPARIN SODIUM (PORCINE) 1000 UNIT/ML IJ SOLN
INTRAMUSCULAR | Status: AC
  Filled 2023-12-27: qty 3

## 2023-12-27 NOTE — Progress Notes (Signed)
 D/C order noted. Contacted [facility] to be advised of pt's d/c today and that pt should resume care on Saturday.   Olivia Canter Renal Navigator [phone]

## 2023-12-27 NOTE — Progress Notes (Signed)
 [facility] Progress Note   Subjective:    Seen and examined patient on the HD unit. She is about to start treatment. Remains on RA. UFG set at 4L. Ordered Midodrine  as she takes this with HD.  Objective Vitals:   12/27/23 0033 12/27/23 0436 12/27/23 0736 12/27/23 0813  BP: 133/67 128/71 137/76 111/72  Pulse: 73  75 74  Resp: 16 16 18 16   Temp: 98 F (36.7 C) 99 F (37.2 C) 97.6 F (36.4 C) 98 F (36.7 C)  TempSrc: Axillary Axillary Oral   SpO2: 100%  98% 100%  Weight:    127.6 kg  Height:       Physical Exam General: Pleasant; obese; NAD; RA Heart: S1 and S2; No murmurs, gallops, or rubs Lungs:Diminished bilateral lower lobes/fine crackles . No wheeze or rhonchi. Breathing is unlabored.  Abdomen: Soft and non-tender Extremities: No LE edema Dialysis Access: L AVF (+) B/T   Filed Weights   12/25/23 1525 12/25/23 1936 12/27/23 0813  Weight: 128.3 kg 125.3 kg 127.6 kg    Intake/Output Summary (Last 24 hours) at 12/27/2023 0830 Last data filed at 12/26/2023 2000 Gross per 24 hour  Intake 400 ml  Output --  Net 400 ml    Additional Objective Labs: Basic Metabolic Panel: Recent Labs  Lab 12/24/23 1155 12/25/23 0928 12/26/23 0354  NA 139 135 136  K 5.1 6.3* 4.8  CL 97* 94* 94*  CO2 31 25 31   GLUCOSE 102* 147* 139*  BUN 33* 51* 28*  CREATININE 8.79* 10.48* 6.83*  CALCIUM  9.9 9.6 9.0  PHOS  --  6.4* 6.2*   Liver Function Tests: Recent Labs  Lab 12/24/23 1004 12/25/23 0928 12/26/23 0354  AST 26  --   --   ALT 13  --   --   ALKPHOS 80  --   --   BILITOT 0.5  --   --   PROT 7.7  --   --   ALBUMIN  4.1 3.5 3.3*   Recent Labs  Lab 12/24/23 1004  LIPASE 17   CBC: Recent Labs  Lab 12/24/23 1004 12/25/23 0913  WBC 3.8* 9.0  NEUTROABS  --  7.2  HGB 11.4* 12.3  HCT 35.7* 37.8  MCV 100.3* 99.0  PLT 134* 149*   Blood Culture    Component Value Date/Time   SDES  09/14/2022 0840    BLOOD RIGHT HAND Performed at [facility], [address], [place], [place] [address]    [facility]  09/14/2022 0840    BOTTLES DRAWN AEROBIC AND ANAEROBIC Blood Culture adequate volume Performed at [facility], [address] [place], [place] [address]    CULT  09/14/2022 0840    NO GROWTH 5 DAYS Performed at [facility], [address] [place], [place] [address]    REPTSTATUS 09/19/2022 FINAL 09/14/2022 0840    Cardiac Enzymes: No results for input(s): "CKTOTAL", "CKMB", "CKMBINDEX", "TROPONINI" in the last 168 hours. CBG: Recent Labs  Lab 12/26/23 0810 12/26/23 1159 12/26/23 1749 12/26/23 2109 12/27/23 0734  GLUCAP 197* 229* 248* 215* 197*   Iron Studies: No results for input(s): "IRON", "TIBC", "TRANSFERRIN", "FERRITIN" in the last 72 hours. Lab Results  Component Value Date   INR 1.9 (H) 06/18/2023   INR 2.0 (H) 06/28/2022   INR 2.3 (H) 11/24/2021   Studies/Results: CT CORONARY MORPH W/CTA COR W/SCORE W/CA W/CM &/OR WO/CM Result Date: 12/26/2023 CLINICAL DATA:  This is a 68 year old female with  anginal symptoms EXAM: Cardiac/Coronary  CTA TECHNIQUE: The patient was scanned on a [employer]. FINDINGS: A 100 kV prospective scan was triggered in the descending thoracic aorta at 111 HU's. Axial non-contrast 3 mm slices were carried out through the heart. The data set was analyzed on a dedicated work station and scored using the Agatson method. Gantry rotation speed was 250 msecs and collimation was .6 mm. No beta blockade and 0.8 mg of sl NTG was given. The 3D data set was reconstructed in 5% intervals of the 67-82 % of the R-R cycle. Diastolic phases were analyzed on a dedicated work station using MPR, MIP and VRT modes. The patient received 80 cc of contrast. Image Quality: Fair with misregistration artifact. Aorta: Normal size.  No calcifications.  No dissection. Aortic Valve:  Trileaflet.  No calcifications. Coronary Arteries:  Normal coronary origin.  Right  Dominance. RCA is a large dominant artery that gives rise to PDA and PLA. Proximal RCA with no plaques. The proximal to mid RCA with noted stair step artifact with poor visibility at that portion of the vessel with no apparent plaque plaque. The mid RCA with mild (24-49%) focal calcified plaque. Distal RCA with no plaques. PDA and PLA with no plaques. Left main is a large artery that gives rise to LAD and LCX arteries. LAD is a large vessel. The proximal LAD with no plaques. Noted stair-step artifact in the mid LAD with no apparent plaques. D1 and D2 are small caliber vessels. LCX is a non-dominant artery that gives rise to one large OM1 branch. The proximal LCX due to the Left atrial appendage closure device is not well visualized but there seems to be a focal soft plaque in the proximal LCX which appears to be mild. There is a focal minimal (<24%) soft plaque in the mid portion of the LCX vessel. The OM1 vessel with mild (24-49%) focal soft plaque. Coronary Calcium  Score: Left main: 0 Left anterior descending artery: 0 Left circumflex artery: 0 Right coronary artery: 58.9 Total: 58.9 Percentile: 78 Other findings: Normal pulmonary vein drainage into the left atrium. The pulmonary artery is mildly dilated (3.25 cm). Status post annuloplasty of the mitral and tricuspid valve, annuloplasty ring well seated. Status post left atrial appendage closure device. Right atrium and right ventricle are both enlarged. IMPRESSION: 1. Coronary calcium  score of 58.9. This was 64 percentile for age and sex matched control. 2. Normal coronary origin with right dominance. 3. CAD-RADS 2. Mild non-obstructive CAD (25-49%). Consider non-atherosclerotic causes of chest pain. Consider preventive therapy and risk factor modification. 4. Total plaque volume 56 mm3 which is 26th percentile for age- and sex-matched controls (calcified plaque 15 mm3; non-calcified plaque 41 mm3,Low attenuation 1 mm3). 5. The pulmonary artery is mildly dilated  (3.25 cm). 6. Status post annuloplasty of the mitral and tricuspid valve, annuloplasty ring well seated. 7. Status post left atrial appendage closure device. 8. Right atrium and right ventricle are both enlarged. The noncardiac portion of this study will be interpreted in separate report by the radiologist. Electronically Signed   By: Kardie  Tobb D.O.   On: 12/26/2023 17:02    Medications:   amiodarone   200 mg Oral Daily   apixaban   5 mg Oral BID   atorvastatin   40 mg Oral Daily   busPIRone   5 mg Oral BID   DULoxetine   60 mg Oral Daily   ferric citrate  210 mg Oral TID WC   insulin  aspart  0-5 Units Subcutaneous QHS  insulin  aspart  0-6 Units Subcutaneous TID WC   lidocaine   1 patch Transdermal Q24H   metoprolol  tartrate  12.5 mg Oral BID   midodrine   10 mg Oral Once in dialysis   pantoprazole  (PROTONIX ) IV  40 mg Intravenous Q12H    Dialysis Orders: TTS - [facility] 4hrs, BFR 400, DFR 600,  EDW 123.8kg, 3K/ 2.5Ca Heparin  2500 unit bolus  Assessment/Plan: Chest pain - Cardiology following: coronary CTA and repeat troponins ordered. Checking A1c, trial NTG. Lidocaine  patch on ESRD -  on HD TTS. Removed 3L off yesterday. On HD. Afib - Continue Eliquis , amiodarone , and metoprolol  Cardiomyopathy - EF from 09/2023 55-60% Hypertension/volume  - Not severely overloaded on exam today. She can reach EDW but noted she's been leaving over EDW lately. Max UFG is limited in outpatient. UFG set for 4L today. Will add Midodrine  with HD per her outpatient regimen. Anemia of CKD - Hgb at goal. Fe/ESA is not indicated at this time Secondary Hyperparathyroidism -  Ca okay but phos is up. Spoke to outpatient RD. Patient noted to have intolerances with other binders causing GI issues. Will try Auryxia 1 tab with meals here. Watch Hgb levels. Nutrition - Renal diet with fluid restriction  Jadene Maxwell, NP [facility] 12/27/2023,8:30 AM  LOS: 0 days

## 2023-12-27 NOTE — Progress Notes (Addendum)
 Patient Name: [name] Date of Encounter: 12/27/2023 [facility] Cardiologist: Sheryle Donning, MD   Interval Summary  .    Continues to report reproducible and persistent chest pain.  Has lidocaine  patch.  Discussed coronary CT results with her today. Vital Signs .    Vitals:   12/27/23 0736 12/27/23 0813 12/27/23 0831 12/27/23 0900  BP: 137/76 111/72 130/72 136/80  Pulse: 75 74 78 77  Resp: 18 16 18 19   Temp: 97.6 F (36.4 C) 98 F (36.7 C)    TempSrc: Oral     SpO2: 98% 100% 100% 97%  Weight:  127.6 kg    Height:        Intake/Output Summary (Last 24 hours) at 12/27/2023 0927 Last data filed at 12/27/2023 0855 Gross per 24 hour  Intake 520 ml  Output --  Net 520 ml      12/27/2023    8:13 AM 12/25/2023    7:36 PM 12/25/2023    3:25 PM  Last 3 Weights  Weight (lbs) 281 lb 4.9 oz 276 lb 3.8 oz 282 lb 13.6 oz  Weight (kg) 127.6 kg 125.3 kg 128.3 kg      Telemetry/ECG    Sinus rhythm heart rates in the 70s.- Personally Reviewed  CV Studies    Echocardiogram 10/15/2023 1. 3D EF 69%. Left ventricular ejection fraction, by estimation, is 55 to  60%. The left ventricle has normal function. The left ventricle has no  regional wall motion abnormalities. Left ventricular diastolic parameters  are indeterminate.   2. Right ventricular systolic function is mildly reduced. The right  ventricular size is mildly enlarged.   3. Left atrial size was severely dilated.   4. Right atrial size was severely dilated.   5. Post repair with ring Mild residual MR Mean diastolic gradient 8 mmHg  at HR 68 bpm. The mitral valve has been repaired/replaced. No evidence of  mitral valve regurgitation. No evidence of mitral stenosis.   6. Post repair with ring. The tricuspid valve is has been  repaired/replaced. Tricuspid valve regurgitation is moderate.   7. The aortic valve is tricuspid. Aortic valve regurgitation is not  visualized. No aortic stenosis is  present.   8. The inferior vena cava is normal in size with greater than 50%  respiratory variability, suggesting right atrial pressure of 3 mmHg.    Physical Exam .   GEN: No acute distress.   Neck: + JVD Cardiac: RRR, no murmurs, rubs, or gallops.  Respiratory: +crackles. GI: Soft, nontender, non-distended  MS: No edema LUE fistula with palpable thrill and audible bruit   Patient Profile    [name] is a 68 y.o. 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  Admitted for chest pain  Assessment & Plan .     Chest pain Mild nonobstructive CAD by cath 2019 Presenting nonexertional severe chest pain that is reproducible but radiating to her left arm and back associated with shortness of breath.  EKG with chronic appearing ST elevation in lead III otherwise no other significant ischemic changes.  Troponins I negative.  No obvious coronary calcifications on CTA.  Overall atypical symptoms, coronary CT performed yesterday demonstrating mild nonobstructive CAD, CAC score 58.9.  Not likely cardiac driven chest pain,  continue to assess for other possible causes and possible MSK involvement. No aspirin  with Eliquis , continue atorvastatin  40 mg  LDL is 80.  Hyperlipidemia LDL this admission is 80. LP(a) 68.9. She is on rosuvastatin  10 mg but has underlying ESRD. Transitioned this to atorvastatin  40 mg, titrate up accordingly and check LDL in 6 to 8 weeks.  Heart failure with recovered EF Most recent echocardiogram shows preserved EF 55 to 60% with mildly reduced RV.  Volume status up but managed by ESRD.  Persistent atrial fibrillation Status post maze 12/2020 Recent DCCV 11/2023 Maintaining sinus rhythm here.  Failed Tikosyn .  And not felt to be a  candidate for ablation by EP. Continue with Eliquis  5 mg twice daily, amiodarone  200mg  daily, Lopressor  12.5 mg twice daily  ESRD on HD Managed per nephrology  Valvular disease Status post MV and TV repair 12/2020.  Mild residual MR and moderate TR on most recent echocardiogram.  OSA on CPAP Follows with Dr. Micael Adas  She sees Dr. Veryl Gottron on the 12th.  For questions or updates, please contact [facility] Please consult [email or web address] for contact info under     Signed, Burnetta Cart, PA-C    History and all data above reviewed.  Patient examined.  I agree with the findings as above.   Pain reproducible with palpation.  The patient exam reveals COR:RRR, no rubs  ,  Lungs: Clear  ,  Abd: Positive bowel sounds, no rebound no guarding, Ext No edema  .  All available labs, radiology testing, previous records reviewed. Agree with documented assessment and plan.   Chest pain: Non anginal.  No change in therapy.  No further cardiac work up.    Royston Cornea Hanni Milford  11:26 AM  12/27/2023

## 2023-12-27 NOTE — TOC Initial Note (Signed)
 Transition of Care [facility]) - Initial/Assessment Note    Patient Details  Name: [name] MRN: [id number] Date of Birth: Jan 07, 1956  [facility] of [facility] CM/SW Contact:    Cosimo Diones, RN Phone Number: 12/27/2023, 2:19 PM  Clinical Narrative: Patient presented for chest pain. PTA patient was from home with daughter. Patient was previously active with [facility]- plan will be to return home with services for PT. [facility] start of care to begin within 24-48 hours post discharge. Patient has DME: cane, rollator, and bedside commode. Patient will have transportation home. No further home needs identified at this time.                   Expected Discharge Plan: Home w Home Health Services Barriers to Discharge: No Barriers Identified   Patient Goals and CMS Choice Patient states their goals for this hospitalization and ongoing recovery are:: Plan to return home with daughter  Expected Discharge Plan and Services   Discharge Planning Services: CM Consult Post Acute Care Choice: Home Health, Resumption of Svcs/PTA Provider Living arrangements for the past 2 months: Apartment Expected Discharge Date: 12/27/23                 DME Agency: NA       HH Arranged: PT HH Agency: [name] Date HH Agency Contacted: 12/27/23 Time HH Agency Contacted: 1418 Representative spoke with at [facility] Agency: Bartholomew Light  Prior Living Arrangements/Services Living arrangements for the past 2 months: Apartment Lives with:: Adult Children Patient language and need for interpreter reviewed:: Yes Do you feel safe going back to the place where you live?: Yes      Need for Family Participation in Patient Care: Yes (Comment) Care giver support system in place?: Yes (comment) Current home services: DME (cane, rollator, bedside commode.) Criminal Activity/Legal Involvement Pertinent to Current Situation/Hospitalization: No - Comment as needed  Activities of Daily  Living   ADL Screening (condition at time of admission) Independently performs ADLs?: Yes (appropriate for developmental age) Is the patient deaf or have difficulty hearing?: No Does the patient have difficulty seeing, even when wearing glasses/contacts?: No Does the patient have difficulty concentrating, remembering, or making decisions?: No  Permission Sought/Granted Permission sought to share information with : Family Supports, Magazine features editor, Case Estate manager/land agent granted to share information with : Yes, Verbal Permission Granted     Permission granted to share info w AGENCY: [facility]        Emotional Assessment Appearance:: Appears stated age Attitude/Demeanor/Rapport: Engaged Affect (typically observed): Appropriate Orientation: : Oriented to Self, Oriented to Place, Oriented to  Time, Oriented to Situation Alcohol  / Substance Use: Not Applicable Psych Involvement: No (comment)  Admission diagnosis:  Chest pain [R07.9] Chest pain, unspecified type [R07.9] Patient Active Problem List   Diagnosis Date Noted   Costochondritis 12/27/2023   Precordial chest pain 12/27/2023   Atrial fibrillation with RVR (HCC) 11/09/2023   HLD (hyperlipidemia) 11/09/2023   Persistent atrial fibrillation (HCC) 07/16/2023   Encounter for monitoring amiodarone  therapy 07/16/2023   Dyspnea 07/03/2023   Vertigo 03/17/2023   Orthostatic hypotension 03/14/2023   Left sided numbness 03/14/2023   Screening for malignant neoplasm of colon 02/21/2023   Nausea and vomiting 02/21/2023   HCAP (healthcare-associated pneumonia) 06/21/2022   ESRD on dialysis (HCC) 06/21/2022   OSA on CPAP 06/21/2022   Class 3 obesity 03/18/2022   Chronic deep vein thrombosis (DVT) of other vein of right upper extremity (HCC) 12/29/2021  Abnormal weight loss 11/16/2021   History of colonic polyps 11/16/2021   Rib pain 11/16/2021   Anxiety and depression 08/26/2021   Essential hypertension 08/26/2021    GERD (gastroesophageal reflux disease) 08/26/2021   COPD (chronic obstructive pulmonary disease) (HCC) 07/29/2021   QT prolongation 07/29/2021   Chronic diastolic CHF (congestive heart failure) (HCC)    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   Teeth missing    Gingivitis    Accretions on teeth    Atrial fibrillation, permanent (HCC) 11/02/2020   NICM (nonischemic cardiomyopathy) (HCC) 04/01/2020   Family history of heart disease 04/01/2020   Mitral regurgitation 04/01/2020   Chest pain 02/26/2012   Type 2 diabetes mellitus with hyperlipidemia (HCC) 02/04/2010   Gastroparesis 02/04/2010   PCP:  Marius Siemens, NP Pharmacy:   [id number] - [place], [place] - [address] [place] [address] Phone: [phone] Fax: [phone]  [facility] - [place], [place] - [address] [place] [address] Phone: [phone] Fax: [phone]  [facility] [address] [place] [address] Phone: [phone] Fax: [phone]  Social Drivers of Health (SDOH) Social History: SDOH Screenings   Food Insecurity: No Food Insecurity (12/25/2023)  Housing: Low Risk  (12/25/2023)  Transportation Needs: No Transportation Needs (12/25/2023)  Utilities: Not At Risk (12/25/2023)  Alcohol  Screen: Low Risk  (06/04/2023)  Depression (PHQ2-9): Medium Risk (11/28/2023)  Financial Resource Strain: Low Risk  (11/19/2023)  Physical Activity: Insufficiently Active (06/04/2023)  Social Connections: Moderately Isolated (12/25/2023)  Stress: No Stress Concern Present (06/04/2023)  Tobacco Use: Low Risk  (12/25/2023)  Health Literacy: Patient Declined (06/04/2023)    Readmission Risk Interventions    11/12/2023    4:32 PM 03/16/2023    3:57 PM 01/20/2022    5:06 PM  Readmission Risk Prevention Plan  Transportation Screening Complete Complete Complete   HRI or Home Care Consult Complete    Social Work Consult for Recovery Care Planning/Counseling Complete    Palliative Care Screening Not Applicable    Medication Review Oceanographer) Referral to Pharmacy Referral to Pharmacy   PCP or Specialist appointment within 3-5 days of discharge  Complete Complete  HRI or Home Care Consult  Complete   SW Recovery Care/Counseling Consult  Complete Complete  Palliative Care Screening  Not Applicable Not Applicable  Skilled Nursing Facility  Not Applicable Not Applicable

## 2023-12-27 NOTE — Procedures (Signed)
 Received patient in bed to unit.  Alert and oriented.  Informed consent signed and in chart.   TX duration: 4 hours  Patient tolerated well.  Transported back to the room  Alert, without acute distress.  Hand-off given to patient's nurse.   Access used: left fistula Access issues: none  Total UF removed: 4 liters  Clover Dao, RN Kidney Dialysis Unit

## 2023-12-27 NOTE — Discharge Planning (Signed)
 [facility] Patient Discharge Orders- [facility] CLINIC: [facility]  Patient's name: [name] Admit/DC Dates: 12/24/2023 - 12/27/2023  Discharge Diagnoses: Atypical chest pain:  Coronary CT performed yesterday demonstrating mild nonobstructive CAD, CAC score 58.9. Not likely cardiac driven chest pain; likely costochondritis   Afib - On Eliquis  and Amiodarone  HFpEF  Aranesp : Given: No     Last Hgb: 11.3 PRBC's Given: No  ESA dose for discharge: N/A IV Iron dose at discharge: Resume weekly Fe  Heparin  change: No  EDW Change: Yes New EDW: 123.3kg  Bath Change: Yes, change to 2K bath and continue 2.5Ca bath  Access intervention/Change: No  Hectorol /Calcitriol change: N/A  Discharge Labs: Calcium  9.2  Phosphorus 5.5  Albumin  3.3  K+ 5.0  IV Antibiotics: No  On Coumadin ?: No   OTHER/APPTS/LAB ORDERS:    D/C Meds to be reconciled by nurse after every discharge.  Completed By: Jadene Maxwell, NP   Reviewed by: MD:______ RN_______

## 2023-12-27 NOTE — Plan of Care (Signed)
  Problem: Clinical Measurements: Goal: Respiratory complications will improve Outcome: Progressing Goal: Cardiovascular complication will be avoided Outcome: Progressing   Problem: Nutrition: Goal: Adequate nutrition will be maintained Outcome: Progressing   Problem: Pain Managment: Goal: General experience of comfort will improve and/or be controlled Outcome: Progressing   Problem: Safety: Goal: Ability to remain free from injury will improve Outcome: Progressing   Problem: Cardiac: Goal: Ability to achieve and maintain adequate cardiopulmonary perfusion will improve Outcome: Progressing   Problem: Cardiac: Goal: Ability to achieve and maintain adequate cardiovascular perfusion will improve Outcome: Progressing

## 2023-12-27 NOTE — Discharge Summary (Addendum)
 Physician Discharge Summary  [name] XBM:[id number] DOB: 18-Sep-1955 DOA: 12/24/2023  PCP: Marius Siemens, NP  Admit date: 12/24/2023 Discharge date: 12/27/2023 30 Day Unplanned Readmission Risk Score    Flowsheet Row ED to Hosp-Admission (Discharged) from 11/09/2023 in [facility]  30 Day Unplanned Readmission Risk Score (%) 28.94 Filed at 11/14/2023 0801       This score is the patient's risk of an unplanned readmission within 30 days of being discharged (0 -100%). The score is based on dignosis, age, lab data, medications, orders, and past utilization.   Low:  0-14.9   Medium: 15-21.9   High: 22-29.9   Extreme: 30 and above          Admitted From: Home Disposition: Home  Recommendations for Outpatient Follow-up:  Follow up with PCP in 1-2 weeks Please obtain BMP/CBC in one week Please follow up with your PCP on the following pending results: Unresulted Labs (From admission, onward)    None         Home Health: None Equipment/Devices: None  Discharge Condition: Stable CODE STATUS: Full code Diet recommendation: Cardiac and diabetic  Subjective: Seen and examined in the dialysis.  Still complains of the left anterior chest and shoulder chest pain and still is tender to palpation.  Lengthy discussion with the patient that this is noncardiac chest pain and likely costochondritis.  Patient now in agreement with going home today.  Brief/Interim Summary: 68 year old with past medical history significant for depression, anxiety, BMI 44, A-fib on Eliquis , nonischemic cardiomyopathy with recovered ejection fraction, COPD, OSA presented complaining of chest pain. Wake her up from her sleep, burning sensation radiated to her left upper extremity and left upper back. Associated with nausea and shortness of breath. CTA chest negative for PE show dilated main pulmonary trunk.  Admitted under hospital service.  Details below.   1-Chest pain: Chest  pain appears to be atypical.  Still complains of chest pain and has significant point tenderness at the left anterior chest and left shoulder.  Troponins negative.  Cardiology on board. coronary CT performed yesterday demonstrating mild nonobstructive CAD, CAC score 58.9. Not likely cardiac driven chest pain.  As mentioned above, this is likely costochondritis.  Patient was advised to take Tylenol  and ibuprofen  alternating.   Acute hypoxic respiratory failure: Currently on room air. -CT angio chest: No PE, dilated main pulmonary trunk. -BNP elevated, mild elevated troponin.  Improved with dialysis. - ESRD on hemodialysis Dialysis per nephrology.   A-fib: In sinus rhythm.  Continue Eliquis  amiodarone  metoprolol   Hyperlipidemia LDL this admission is 80. LP(a) 68.9. She is on rosuvastatin  10 mg but has underlying ESRD. Transitioned this to atorvastatin  40 mg, titrate up accordingly and check LDL in 6 to 8 weeks.   Heart failure with recovered EF Most recent echocardiogram shows preserved EF 55 to 60% with mildly reduced RV.  Volume status up but managed by ESRD. Followed by advanced heart failure discharge, on Lopressor  12.5 mg twice daily consider consolidating to Toprol -XL.    COPD, OSA: Continue CPAP As needed nebulizer.  Follow-up with Dr. Micael Adas.   Depression, anxiety: On Cymbalta  and BuSpar    Morbid obesity class III: BMI 44.  Weight loss and diet modification counseled.  Valvular disease Status post MV and TV repair 12/2020.  Mild residual MR and moderate TR on most recent echocardiogram.  Discharge plan was discussed with patient and/or family member and they verbalized understanding and agreed with it.  Discharge Diagnoses:  Principal  Problem:   Chest pain Active Problems:   ESRD on dialysis (HCC)   OSA on CPAP   COPD (chronic obstructive pulmonary disease) (HCC)   NICM (nonischemic cardiomyopathy) (HCC)   Anxiety and depression   Persistent atrial fibrillation (HCC)    Costochondritis   Precordial chest pain    Discharge Instructions   Allergies as of 12/27/2023       Reactions   Bee Pollen Anaphylaxis, Other (See Comments)   UNCONFIRMED, but IS allergic to bee VENOM   Bee Venom Anaphylaxis   Sulfa Antibiotics Itching, Rash   Chlorhexidine     Unknown rxn.   Iodinated Contrast Media Nausea And Vomiting        Medication List     STOP taking these medications    rosuvastatin  10 MG tablet Commonly known as: CRESTOR        TAKE these medications    acetaminophen  325 MG tablet Commonly known as: TYLENOL  Take 2 tablets (650 mg total) by mouth every 6 (six) hours as needed for mild pain or moderate pain.   albuterol  108 (90 Base) MCG/ACT inhaler Commonly known as: Proventil  HFA Inhale 1-2 puffs into the lungs every 6 (six) hours as needed for wheezing or shortness of breath.   amiodarone  200 MG tablet Commonly known as: PACERONE  Take 1 tablet (200 mg total) by mouth daily. What changed: when to take this   atorvastatin  40 MG tablet Commonly known as: LIPITOR Take 1 tablet (40 mg total) by mouth daily.   benzonatate  100 MG capsule Commonly known as: Tessalon  Perles Take 2 capsules (200 mg total) by mouth 2 (two) times daily as needed for cough.   busPIRone  5 MG tablet Commonly known as: BUSPAR  Take 1 tablet (5 mg total) by mouth 2 (two) times daily.   chlorpheniramine-HYDROcodone  10-8 MG/5ML Commonly known as: TUSSIONEX Take 5 mLs by mouth every 12 (twelve) hours as needed for cough.   diphenhydrAMINE  25 MG tablet Commonly known as: BENADRYL  Take 25 mg by mouth every 6 (six) hours as needed for itching.   DULoxetine  60 MG capsule Commonly known as: CYMBALTA  TAKE ONE CAPSULE (60MG  TOTAL) BY MOUTH DAILY AT 9AM   Eliquis  5 MG Tabs tablet Generic drug: apixaban  TAKE ONE TABLET (5MG  TOTAL) BY MOUTH TWICE DAILY @9AM -5PM   EPINEPHrine  0.3 mg/0.3 mL Soaj injection Commonly known as: EPI-PEN Inject 0.3 mg into the muscle as  needed for anaphylaxis.   Flonase  Allergy Relief 50 MCG/ACT nasal spray Generic drug: fluticasone  Place 2 sprays into both nostrils daily as needed for allergies.   insulin  lispro 100 UNIT/ML KwikPen Commonly known as: HumaLOG  KwikPen For blood sugars 0-150 give 0 units of insulin , 151-200 give 2 units of insulin , 201-250 give 4 units, 251-300 give 6 units, 301-350 give 8 units, 351-400 give 10 units,> 400 give 12 units and call M.D.   Lantus  SoloStar 100 UNIT/ML Solostar Pen Generic drug: insulin  glargine Inject 10 Units into the skin daily.   lidocaine -prilocaine  cream Commonly known as: EMLA  Apply 1 Application topically Every Tuesday,Thursday,and Saturday with dialysis.   loratadine  10 MG tablet Commonly known as: CLARITIN  Take 10 mg by mouth every morning.   metoCLOPramide  5 MG tablet Commonly known as: Reglan  Take 1 tablet (5 mg total) by mouth every 8 (eight) hours as needed for nausea.   metoprolol  tartrate 25 MG tablet Commonly known as: LOPRESSOR  Take 0.5 tablets (12.5 mg total) by mouth 2 (two) times daily.   midodrine  10 MG tablet Commonly known as: PROAMATINE  Take  1 tablet (10 mg total) by mouth See admin instructions. Take 10mg  (1 tablet) by mouth on Tuesday's, Thursday's, and Saturday's.   MiraLax  17 GM/SCOOP powder Generic drug: polyethylene glycol powder Take 17 g by mouth daily as needed (constipation.).   MULTIVITAMIN WOMEN PO Take 1 tablet by mouth daily.   nitroGLYCERIN  0.4 MG SL tablet Commonly known as: NITROSTAT  Place 1 tablet (0.4 mg total) under the tongue every 5 (five) minutes x 3 doses as needed for chest pain.   pantoprazole  40 MG tablet Commonly known as: PROTONIX  Take 1 tablet (40 mg total) by mouth daily.        Follow-up Information     Marius Siemens, NP Follow up in 1 week(s).   Specialty: Internal Medicine Contact information: [address] [place] [address] [phone]                Allergies   Allergen Reactions   Bee Pollen Anaphylaxis and Other (See Comments)    UNCONFIRMED, but IS allergic to bee VENOM   Bee Venom Anaphylaxis   Sulfa Antibiotics Itching and Rash   Chlorhexidine      Unknown rxn.   Iodinated Contrast Media Nausea And Vomiting    Consultations: Cardiology   Procedures/Studies: CT CORONARY MORPH W/CTA COR W/SCORE W/CA W/CM &/OR WO/CM Result Date: 12/26/2023 CLINICAL DATA:  This is a 68 year old female with anginal symptoms EXAM: Cardiac/Coronary  CTA TECHNIQUE: The patient was scanned on a [employer]. FINDINGS: A 100 kV prospective scan was triggered in the descending thoracic aorta at 111 HU's. Axial non-contrast 3 mm slices were carried out through the heart. The data set was analyzed on a dedicated work station and scored using the Agatson method. Gantry rotation speed was 250 msecs and collimation was .6 mm. No beta blockade and 0.8 mg of sl NTG was given. The 3D data set was reconstructed in 5% intervals of the 67-82 % of the R-R cycle. Diastolic phases were analyzed on a dedicated work station using MPR, MIP and VRT modes. The patient received 80 cc of contrast. Image Quality: Fair with misregistration artifact. Aorta: Normal size.  No calcifications.  No dissection. Aortic Valve:  Trileaflet.  No calcifications. Coronary Arteries:  Normal coronary origin.  Right Dominance. RCA is a large dominant artery that gives rise to PDA and PLA. Proximal RCA with no plaques. The proximal to mid RCA with noted stair step artifact with poor visibility at that portion of the vessel with no apparent plaque plaque. The mid RCA with mild (24-49%) focal calcified plaque. Distal RCA with no plaques. PDA and PLA with no plaques. Left main is a large artery that gives rise to LAD and LCX arteries. LAD is a large vessel. The proximal LAD with no plaques. Noted stair-step artifact in the mid LAD with no apparent plaques. D1 and D2 are small caliber vessels. LCX is a  non-dominant artery that gives rise to one large OM1 branch. The proximal LCX due to the Left atrial appendage closure device is not well visualized but there seems to be a focal soft plaque in the proximal LCX which appears to be mild. There is a focal minimal (<24%) soft plaque in the mid portion of the LCX vessel. The OM1 vessel with mild (24-49%) focal soft plaque. Coronary Calcium  Score: Left main: 0 Left anterior descending artery: 0 Left circumflex artery: 0 Right coronary artery: 58.9 Total: 58.9 Percentile: 78 Other findings: Normal pulmonary vein drainage into the left atrium.  The pulmonary artery is mildly dilated (3.25 cm). Status post annuloplasty of the mitral and tricuspid valve, annuloplasty ring well seated. Status post left atrial appendage closure device. Right atrium and right ventricle are both enlarged. IMPRESSION: 1. Coronary calcium  score of 58.9. This was 56 percentile for age and sex matched control. 2. Normal coronary origin with right dominance. 3. CAD-RADS 2. Mild non-obstructive CAD (25-49%). Consider non-atherosclerotic causes of chest pain. Consider preventive therapy and risk factor modification. 4. Total plaque volume 56 mm3 which is 26th percentile for age- and sex-matched controls (calcified plaque 15 mm3; non-calcified plaque 41 mm3,Low attenuation 1 mm3). 5. The pulmonary artery is mildly dilated (3.25 cm). 6. Status post annuloplasty of the mitral and tricuspid valve, annuloplasty ring well seated. 7. Status post left atrial appendage closure device. 8. Right atrium and right ventricle are both enlarged. The noncardiac portion of this study will be interpreted in separate report by the radiologist. Electronically Signed   By: Kardie  Tobb D.O.   On: 12/26/2023 17:02   CT Angio Chest PE W and/or Wo Contrast Result Date: 12/24/2023 CLINICAL DATA:  Pulmonary embolism (PE) suspected, high prob. EXAM: CT ANGIOGRAPHY CHEST WITH CONTRAST TECHNIQUE: Multidetector CT imaging of the  chest was performed using the standard protocol during bolus administration of intravenous contrast. Multiplanar CT image reconstructions and MIPs were obtained to evaluate the vascular anatomy. RADIATION DOSE REDUCTION: This exam was performed according to the departmental dose-optimization program which includes automated exposure control, adjustment of the mA and/or kV according to patient size and/or use of iterative reconstruction technique. CONTRAST:  OMNIPAQUE  IOHEXOL  350 MG/ML SOLN COMPARISON:  11/09/2023.  The FINDINGS: Cardiovascular: No evidence of embolism to the proximal subsegmental pulmonary artery level. There is dilation of the main pulmonary trunk measuring up to 3.3 cm, which is nonspecific but can be seen with pulmonary artery hypertension. Moderate cardiomegaly. Left atrial appendage closure device noted. Mitral and tricuspid valve annuloplasty noted. No pericardial effusion. No aortic aneurysm. There are mild peripheral atherosclerotic vascular calcifications of thoracic aorta and its major branches. Mediastinum/Nodes: Visualized thyroid  gland appears grossly unremarkable. No solid / cystic mediastinal masses. The esophagus is nondistended precluding optimal assessment. No axillary, mediastinal or hilar lymphadenopathy by size criteria. Lungs/Pleura: The central tracheo-bronchial tree is patent. There are patchy areas of linear, plate-like atelectasis and/or scarring throughout bilateral lungs. There is mosaic attenuation of bilateral lung bases, consistent with heterogeneous air trapping related to small airways disease. No mass or consolidation. No pleural effusion or pneumothorax. No suspicious lung nodules. Upper Abdomen: Visualized upper abdominal viscera within normal limits. Musculoskeletal: The visualized soft tissues of the chest wall are grossly unremarkable. No suspicious osseous lesions. There are mild multilevel degenerative changes in the visualized spine. Redemonstration of  mild-to-moderate anterior wedging deformity of T11 vertebral body, similar to the prior study. No significant retropulsion or spinal canal compromise. Review of the MIP images confirms the above findings. IMPRESSION: 1. No embolism to the proximal subsegmental pulmonary artery level. Dilated main pulmonary trunk, which is nonspecific but can be seen with pulmonary artery hypertension. 2. No lung mass, consolidation, pleural effusion or pneumothorax. 3. Multiple other nonacute observations, as described above. Aortic Atherosclerosis (ICD10-I70.0). Electronically Signed   By: Beula Brunswick M.D.   On: 12/24/2023 15:06   DG Chest Portable 1 View Result Date: 12/24/2023 CLINICAL DATA:  Chest pain EXAM: PORTABLE CHEST 1 VIEW COMPARISON:  Chest x-ray performed November 12, 2023 FINDINGS: The heart is enlarged. Postsurgical changes from sternotomy. No pleural  effusion or pneumothorax. IMPRESSION: 1. Enlarged heart with postsurgical changes from sternotomy. Electronically Signed   By: Reagan Camera M.D.   On: 12/24/2023 10:56   EP STUDY Result Date: 12/05/2023 See surgical note for result.    Discharge Exam: Vitals:   12/27/23 1100 12/27/23 1130  BP: 107/61 96/79  Pulse: 74 80  Resp: 15 16  Temp:    SpO2: 95% 99%   Vitals:   12/27/23 1000 12/27/23 1030 12/27/23 1100 12/27/23 1130  BP: 123/69 114/69 107/61 96/79  Pulse: 74 74 74 80  Resp: 16 16 15 16   Temp:      TempSrc:      SpO2: 97% 97% 95% 99%  Weight:      Height:        General: Pt is alert, awake, not in acute distress, morbidly obese Cardiovascular: RRR, S1/S2 +, no rubs, no gallops Respiratory: CTA bilaterally, no wheezing, no rhonchi, left anterior upper chest tenderness and shoulder tenderness Abdominal: Soft, NT, ND, bowel sounds + Extremities: no edema, no cyanosis    The results of significant diagnostics from this hospitalization (including imaging, microbiology, ancillary and laboratory) are listed below for reference.      Microbiology: Recent Results (from the past 240 hours)  MRSA Next Gen by PCR, Nasal     Status: None   Collection Time: 12/25/23  8:21 PM   Specimen: Nasal Mucosa; Nasal Swab  Result Value Ref Range Status   MRSA by PCR Next Gen NOT DETECTED NOT DETECTED Final    Comment: (NOTE) The GeneXpert MRSA Assay (FDA approved for NASAL specimens only), is one component of a comprehensive MRSA colonization surveillance program. It is not intended to diagnose MRSA infection nor to guide or monitor treatment for MRSA infections. Test performance is not FDA approved in patients less than 14 years old. Performed at [facility], [address] [place], [place] [address]      Labs: BNP (last 3 results) Recent Labs    01/11/23 1230 03/14/23 1210 11/09/23 1634  BNP 144.1* 152.5* 278.7*   Basic Metabolic Panel: Recent Labs  Lab 12/24/23 1004 12/24/23 1155 12/25/23 0928 12/26/23 0354 12/27/23 0829  NA 138 139 135 136 131*  K 5.7* 5.1 6.3* 4.8 5.0  CL 96* 97* 94* 94* 93*  CO2 30 31 25 31 24   GLUCOSE 93 102* 147* 139* 216*  BUN 32* 33* 51* 28* 59*  CREATININE 8.70* 8.79* 10.48* 6.83* 9.50*  CALCIUM  10.1 9.9 9.6 9.0 9.2  PHOS  --   --  6.4* 6.2* 5.5*   Liver Function Tests: Recent Labs  Lab 12/24/23 1004 12/25/23 0928 12/26/23 0354 12/27/23 0829  AST 26  --   --   --   ALT 13  --   --   --   ALKPHOS 80  --   --   --   BILITOT 0.5  --   --   --   PROT 7.7  --   --   --   ALBUMIN  4.1 3.5 3.3* 3.3*   Recent Labs  Lab 12/24/23 1004  LIPASE 17   No results for input(s): "AMMONIA" in the last 168 hours. CBC: Recent Labs  Lab 12/24/23 1004 12/25/23 0913 12/27/23 0829  WBC 3.8* 9.0 11.0*  NEUTROABS  --  7.2  --   HGB 11.4* 12.3 11.3*  HCT 35.7* 37.8 34.6*  MCV 100.3* 99.0 97.7  PLT 134* 149* 159   Cardiac Enzymes: No results for input(s): "  CKTOTAL", "CKMB", "CKMBINDEX", "TROPONINI" in the last 168 hours. BNP: Invalid input(s): "POCBNP" CBG: Recent  Labs  Lab 12/26/23 0810 12/26/23 1159 12/26/23 1749 12/26/23 2109 12/27/23 0734  GLUCAP 197* 229* 248* 215* 197*   D-Dimer No results for input(s): "DDIMER" in the last 72 hours. Hgb A1c Recent Labs    12/25/23 0514  HGBA1C 5.0   Lipid Profile Recent Labs    12/26/23 0354  CHOL 151  HDL 56  LDLCALC 80  TRIG 75  CHOLHDL 2.7   Thyroid  function studies No results for input(s): "TSH", "T4TOTAL", "T3FREE", "THYROIDAB" in the last 72 hours.  Invalid input(s): "FREET3" Anemia work up No results for input(s): "VITAMINB12", "FOLATE", "FERRITIN", "TIBC", "IRON", "RETICCTPCT" in the last 72 hours. Urinalysis    Component Value Date/Time   COLORURINE AMBER (A) 11/07/2022 1447   APPEARANCEUR CLOUDY (A) 11/07/2022 1447   LABSPEC 1.015 11/07/2022 1447   PHURINE 5.0 11/07/2022 1447   GLUCOSEU NEGATIVE 11/07/2022 1447   HGBUR SMALL (A) 11/07/2022 1447   BILIRUBINUR small (A) 02/21/2023 1748   KETONESUR trace (5) (A) 02/21/2023 1748   KETONESUR NEGATIVE 11/07/2022 1447   PROTEINUR =100 (A) 02/21/2023 1748   PROTEINUR 100 (A) 11/07/2022 1447   UROBILINOGEN 0.2 02/21/2023 1748   UROBILINOGEN 1.0 10/26/2014 0249   NITRITE Negative 02/21/2023 1748   NITRITE NEGATIVE 11/07/2022 1447   LEUKOCYTESUR Negative 02/21/2023 1748   LEUKOCYTESUR TRACE (A) 11/07/2022 1447   Sepsis Labs Recent Labs  Lab 12/24/23 1004 12/25/23 0913 12/27/23 0829  WBC 3.8* 9.0 11.0*   Microbiology Recent Results (from the past 240 hours)  MRSA Next Gen by PCR, Nasal     Status: None   Collection Time: 12/25/23  8:21 PM   Specimen: Nasal Mucosa; Nasal Swab  Result Value Ref Range Status   MRSA by PCR Next Gen NOT DETECTED NOT DETECTED Final    Comment: (NOTE) The GeneXpert MRSA Assay (FDA approved for NASAL specimens only), is one component of a comprehensive MRSA colonization surveillance program. It is not intended to diagnose MRSA infection nor to guide or monitor treatment for MRSA  infections. Test performance is not FDA approved in patients less than 67 years old. Performed at [facility], [address], [place], [place] [address]     FURTHER DISCHARGE INSTRUCTIONS:   Get Medicines reviewed and adjusted: Please take all your medications with you for your next visit with your Primary MD   Laboratory/radiological data: Please request your Primary MD to go over all hospital tests and procedure/radiological results at the follow up, please ask your Primary MD to get all Hospital records sent to his/her office.   In some cases, they will be blood work, cultures and biopsy results pending at the time of your discharge. Please request that your primary care M.D. goes through all the records of your hospital data and follows up on these results.   Also Note the following: If you experience worsening of your admission symptoms, develop shortness of breath, life threatening emergency, suicidal or homicidal thoughts you must seek medical attention immediately by calling 911 or calling your MD immediately  if symptoms less severe.   You must read complete instructions/literature along with all the possible adverse reactions/side effects for all the Medicines you take and that have been prescribed to you. Take any new Medicines after you have completely understood and accpet all the possible adverse reactions/side effects.    Do not drive when taking Pain medications or sleeping medications (Benzodaizepines)  Do not take more than prescribed Pain, Sleep and Anxiety Medications. It is not advisable to combine anxiety,sleep and pain medications without talking with your primary care practitioner   Special Instructions: If you have smoked or chewed Tobacco  in the last 2 yrs please stop smoking, stop any regular Alcohol   and or any Recreational drug use.   Wear Seat belts while driving.   Please note: You were cared for by a hospitalist during your hospital stay. Once  you are discharged, your primary care physician will handle any further medical issues. Please note that NO REFILLS for any discharge medications will be authorized once you are discharged, as it is imperative that you return to your primary care physician (or establish a relationship with a primary care physician if you do not have one) for your post hospital discharge needs so that they can reassess your need for medications and monitor your lab values  Time coordinating discharge: Over 30 minutes  SIGNED:   Modena Andes, MD  [facility] 12/27/2023, 12:13 PM *Please note that this is a verbal dictation therefore any spelling or grammatical errors are due to the "[employer]agon [employer] One" system interpretation. If 7PM-7AM, please contact night-coverage [email or web address]

## 2023-12-28 NOTE — TOC Transition Note (Addendum)
 Transition of Care - Initial Contact after Hospitalization  Date of discharge: 12/27/2023  Date of contact: 12/28/23  Method: Attempted phone call Spoke to: No answer  Patient contacted to discuss transition of care from recent inpatient hospitalization but she didn't answer the phone. Left a voicemail advising her to reach out to [facility] for any questions or concerns.  Patient will return to her outpatient HD unit on: 12/29/23 at [facility].  Jadene Maxwell, NP [facility]

## 2023-12-29 DIAGNOSIS — N186 End stage renal disease: Secondary | ICD-10-CM | POA: Diagnosis not present

## 2023-12-29 DIAGNOSIS — D631 Anemia in chronic kidney disease: Secondary | ICD-10-CM | POA: Diagnosis not present

## 2023-12-29 DIAGNOSIS — D508 Other iron deficiency anemias: Secondary | ICD-10-CM | POA: Diagnosis not present

## 2023-12-29 DIAGNOSIS — E1129 Type 2 diabetes mellitus with other diabetic kidney complication: Secondary | ICD-10-CM | POA: Diagnosis not present

## 2023-12-29 DIAGNOSIS — Z992 Dependence on renal dialysis: Secondary | ICD-10-CM | POA: Diagnosis not present

## 2023-12-29 DIAGNOSIS — N2581 Secondary hyperparathyroidism of renal origin: Secondary | ICD-10-CM | POA: Diagnosis not present

## 2023-12-29 DIAGNOSIS — R079 Chest pain, unspecified: Secondary | ICD-10-CM | POA: Diagnosis not present

## 2023-12-31 ENCOUNTER — Telehealth: Payer: Self-pay

## 2023-12-31 ENCOUNTER — Ambulatory Visit (HOSPITAL_BASED_OUTPATIENT_CLINIC_OR_DEPARTMENT_OTHER): Payer: 59 | Admitting: Cardiology

## 2023-12-31 DIAGNOSIS — D508 Other iron deficiency anemias: Secondary | ICD-10-CM | POA: Diagnosis not present

## 2023-12-31 DIAGNOSIS — E1129 Type 2 diabetes mellitus with other diabetic kidney complication: Secondary | ICD-10-CM | POA: Diagnosis not present

## 2023-12-31 DIAGNOSIS — D631 Anemia in chronic kidney disease: Secondary | ICD-10-CM | POA: Diagnosis not present

## 2023-12-31 DIAGNOSIS — R079 Chest pain, unspecified: Secondary | ICD-10-CM | POA: Diagnosis not present

## 2023-12-31 DIAGNOSIS — N186 End stage renal disease: Secondary | ICD-10-CM | POA: Diagnosis not present

## 2023-12-31 DIAGNOSIS — N2581 Secondary hyperparathyroidism of renal origin: Secondary | ICD-10-CM | POA: Diagnosis not present

## 2023-12-31 DIAGNOSIS — Z992 Dependence on renal dialysis: Secondary | ICD-10-CM | POA: Diagnosis not present

## 2023-12-31 NOTE — Transitions of Care (Post Inpatient/ED Visit) (Signed)
   12/31/2023  Name: [name] MRN: [id number] DOB: September 04, 1955  Today's TOC FU Call Status: Today's TOC FU Call Status:: Unsuccessful Call (1st Attempt) Unsuccessful Call (1st Attempt) Date: 12/31/23  Attempted to reach the patient regarding the most recent Inpatient/ED visit.  Follow Up Plan: Additional outreach attempts will be made to reach the patient to complete the Transitions of Care (Post Inpatient/ED visit) call.   Signature  Burnett Carson, RN

## 2024-01-01 ENCOUNTER — Telehealth: Payer: Self-pay

## 2024-01-01 DIAGNOSIS — R079 Chest pain, unspecified: Secondary | ICD-10-CM | POA: Diagnosis not present

## 2024-01-01 DIAGNOSIS — D508 Other iron deficiency anemias: Secondary | ICD-10-CM | POA: Diagnosis not present

## 2024-01-01 DIAGNOSIS — N186 End stage renal disease: Secondary | ICD-10-CM | POA: Diagnosis not present

## 2024-01-01 DIAGNOSIS — N2581 Secondary hyperparathyroidism of renal origin: Secondary | ICD-10-CM | POA: Diagnosis not present

## 2024-01-01 DIAGNOSIS — Z992 Dependence on renal dialysis: Secondary | ICD-10-CM | POA: Diagnosis not present

## 2024-01-01 DIAGNOSIS — E1129 Type 2 diabetes mellitus with other diabetic kidney complication: Secondary | ICD-10-CM | POA: Diagnosis not present

## 2024-01-01 DIAGNOSIS — D631 Anemia in chronic kidney disease: Secondary | ICD-10-CM | POA: Diagnosis not present

## 2024-01-01 DIAGNOSIS — E1122 Type 2 diabetes mellitus with diabetic chronic kidney disease: Secondary | ICD-10-CM | POA: Diagnosis not present

## 2024-01-01 MED ORDER — ACCU-CHEK SOFTCLIX LANCETS MISC: 6 refills | Status: DC

## 2024-01-01 MED ORDER — ACCU-CHEK GUIDE TEST VI STRP: ORAL_STRIP | 6 refills | Status: DC

## 2024-01-01 MED ORDER — ACCU-CHEK GUIDE W/DEVICE KIT: PACK | 0 refills | Status: DC

## 2024-01-01 NOTE — Telephone Encounter (Signed)
 I spoke to the patient for [facility] call and she said she doesn't have a glucometer.

## 2024-01-01 NOTE — Transitions of Care (Post Inpatient/ED Visit) (Signed)
   01/01/2024  Name: [name] MRN: [id number] DOB: 1956-03-08  Today's TOC FU Call Status: Today's TOC FU Call Status:: Successful TOC FU Call Completed Unsuccessful Call (1st Attempt) Date: 12/31/23 [facility] FU Call Complete Date: 01/01/24 Patient's Name and Date of Birth confirmed.  Transition Care Management Follow-up Telephone Call Date of Discharge: 12/27/23 Discharge Facility: [facility]) Type of Discharge: Inpatient Admission Primary Inpatient Discharge Diagnosis:: chest pain How have you been since you were released from the hospital?: Better Any questions or concerns?: No  Items Reviewed: Did you receive and understand the discharge instructions provided?: Yes Medications obtained,verified, and reconciled?: Partial Review Completed Reason for Partial Mediation Review: She said she has all of her medications and she did not have any questions about the medications and did not need to review the med list.  She stated she does not have a glucometer- PCP notified. Any new allergies since your discharge?: No Dietary orders reviewed?: Yes Type of Diet Ordered:: heart healthy, low sodium, diabetic Do you have support at home?: Yes People in Home [RPT]: child(ren), adult Name of Support/Comfort Primary Source: her daughter  Medications Reviewed Today: Medications Reviewed Today   Medications were not reviewed in this encounter     Home Care and Equipment/Supplies: Were Home Health Services Ordered?: Yes Name of Home Health Agency:: [facility] Has Agency set up a time to come to your home?: No EMR reviewed for Home Health Orders: Orders present/patient has not received call (refer to CM for follow-up) (I called [facility] and spoke to [place] who confirmed receipt of the referral  and said they will be reaching out to the paitent in the next 48 hours.  She said she will call the patient and notify her.) Any new equipment or medical supplies ordered?: No  Functional  Questionnaire: Do you need assistance with bathing/showering or dressing?: No Do you need assistance with meal preparation?: No Do you need assistance with eating?: No Do you have difficulty maintaining continence: No Do you need assistance with getting out of bed/getting out of a chair/moving?: Yes (ambulates with a cane) Do you have difficulty managing or taking your medications?: No  Follow up appointments reviewed: PCP Follow-up appointment confirmed?: Yes Date of PCP follow-up appointment?: 01/18/24 Follow-up Provider: Madelyn Schick, NP Specialist Hospital Follow-up appointment confirmed?: Yes Date of Specialist follow-up appointment?: 01/25/24 Follow-Up Specialty Provider:: cardiology.  She attends dialysis TTS Do you need transportation to your follow-up appointment?: No Do you understand care options if your condition(s) worsen?: Yes-patient verbalized understanding    SIGNATURE Burnett Carson, RN

## 2024-01-01 NOTE — Telephone Encounter (Signed)
 I called the patient to inform her that the prescription for the glucometer was sent to [address]  I had to leave a message with call back requested

## 2024-01-03 ENCOUNTER — Ambulatory Visit: Admitting: Pulmonary Disease

## 2024-01-03 DIAGNOSIS — R079 Chest pain, unspecified: Secondary | ICD-10-CM | POA: Diagnosis not present

## 2024-01-03 DIAGNOSIS — Z992 Dependence on renal dialysis: Secondary | ICD-10-CM | POA: Diagnosis not present

## 2024-01-03 DIAGNOSIS — D631 Anemia in chronic kidney disease: Secondary | ICD-10-CM | POA: Diagnosis not present

## 2024-01-03 DIAGNOSIS — D508 Other iron deficiency anemias: Secondary | ICD-10-CM | POA: Diagnosis not present

## 2024-01-03 DIAGNOSIS — N2581 Secondary hyperparathyroidism of renal origin: Secondary | ICD-10-CM | POA: Diagnosis not present

## 2024-01-03 DIAGNOSIS — N186 End stage renal disease: Secondary | ICD-10-CM | POA: Diagnosis not present

## 2024-01-03 DIAGNOSIS — E1129 Type 2 diabetes mellitus with other diabetic kidney complication: Secondary | ICD-10-CM | POA: Diagnosis not present

## 2024-01-03 NOTE — Telephone Encounter (Signed)
 I called the patient and informed her that the orders for the glucometer and testing supplies were sent to her pharmacy.  She was very Adult nurse.

## 2024-01-04 ENCOUNTER — Telehealth: Payer: Self-pay

## 2024-01-04 ENCOUNTER — Other Ambulatory Visit (INDEPENDENT_AMBULATORY_CARE_PROVIDER_SITE_OTHER): Payer: Self-pay

## 2024-01-04 DIAGNOSIS — M1990 Unspecified osteoarthritis, unspecified site: Secondary | ICD-10-CM | POA: Diagnosis not present

## 2024-01-04 DIAGNOSIS — I428 Other cardiomyopathies: Secondary | ICD-10-CM | POA: Diagnosis not present

## 2024-01-04 DIAGNOSIS — M940 Chondrocostal junction syndrome [Tietze]: Secondary | ICD-10-CM | POA: Diagnosis not present

## 2024-01-04 DIAGNOSIS — I081 Rheumatic disorders of both mitral and tricuspid valves: Secondary | ICD-10-CM | POA: Diagnosis not present

## 2024-01-04 DIAGNOSIS — R072 Precordial pain: Secondary | ICD-10-CM | POA: Diagnosis not present

## 2024-01-04 DIAGNOSIS — J309 Allergic rhinitis, unspecified: Secondary | ICD-10-CM | POA: Diagnosis not present

## 2024-01-04 DIAGNOSIS — I5042 Chronic combined systolic (congestive) and diastolic (congestive) heart failure: Secondary | ICD-10-CM | POA: Diagnosis not present

## 2024-01-04 DIAGNOSIS — Z8616 Personal history of COVID-19: Secondary | ICD-10-CM | POA: Diagnosis not present

## 2024-01-04 DIAGNOSIS — I132 Hypertensive heart and chronic kidney disease with heart failure and with stage 5 chronic kidney disease, or end stage renal disease: Secondary | ICD-10-CM | POA: Diagnosis not present

## 2024-01-04 DIAGNOSIS — Z992 Dependence on renal dialysis: Secondary | ICD-10-CM | POA: Diagnosis not present

## 2024-01-04 DIAGNOSIS — N186 End stage renal disease: Secondary | ICD-10-CM | POA: Diagnosis not present

## 2024-01-04 DIAGNOSIS — D631 Anemia in chronic kidney disease: Secondary | ICD-10-CM | POA: Diagnosis not present

## 2024-01-04 DIAGNOSIS — Z794 Long term (current) use of insulin: Secondary | ICD-10-CM | POA: Diagnosis not present

## 2024-01-04 DIAGNOSIS — E785 Hyperlipidemia, unspecified: Secondary | ICD-10-CM | POA: Diagnosis not present

## 2024-01-04 DIAGNOSIS — K219 Gastro-esophageal reflux disease without esophagitis: Secondary | ICD-10-CM | POA: Diagnosis not present

## 2024-01-04 DIAGNOSIS — I5032 Chronic diastolic (congestive) heart failure: Secondary | ICD-10-CM

## 2024-01-04 DIAGNOSIS — J449 Chronic obstructive pulmonary disease, unspecified: Secondary | ICD-10-CM

## 2024-01-04 DIAGNOSIS — I4819 Other persistent atrial fibrillation: Secondary | ICD-10-CM | POA: Diagnosis not present

## 2024-01-04 DIAGNOSIS — Z7901 Long term (current) use of anticoagulants: Secondary | ICD-10-CM | POA: Diagnosis not present

## 2024-01-04 DIAGNOSIS — J4489 Other specified chronic obstructive pulmonary disease: Secondary | ICD-10-CM | POA: Diagnosis not present

## 2024-01-04 DIAGNOSIS — G4733 Obstructive sleep apnea (adult) (pediatric): Secondary | ICD-10-CM | POA: Diagnosis not present

## 2024-01-04 DIAGNOSIS — E1122 Type 2 diabetes mellitus with diabetic chronic kidney disease: Secondary | ICD-10-CM | POA: Diagnosis not present

## 2024-01-04 DIAGNOSIS — J9601 Acute respiratory failure with hypoxia: Secondary | ICD-10-CM | POA: Diagnosis not present

## 2024-01-04 NOTE — Telephone Encounter (Signed)
 Returned Rice Chamorro call and provided verbal orders and have printed out rx for bedside commode will have provider sign Monday and will fax after

## 2024-01-04 NOTE — Telephone Encounter (Signed)
 Copied from CRM [id number]. Topic: Clinical - Home Health Verbal Orders >> Jan 04, 2024  3:16 PM Jorie Newness J wrote: Caller/Agency: Shirl Dose [facility] Number: [phone] Service Requested: Physical Therapy Frequency: 2x week for 2 weeks, 1x a week for 6 weeks Any new concerns about the patient? Yes, requesting a nursing evaluation and a script for a bedside commode, which can be faxed to [phone]

## 2024-01-05 DIAGNOSIS — D631 Anemia in chronic kidney disease: Secondary | ICD-10-CM | POA: Diagnosis not present

## 2024-01-05 DIAGNOSIS — E1129 Type 2 diabetes mellitus with other diabetic kidney complication: Secondary | ICD-10-CM | POA: Diagnosis not present

## 2024-01-05 DIAGNOSIS — Z992 Dependence on renal dialysis: Secondary | ICD-10-CM | POA: Diagnosis not present

## 2024-01-05 DIAGNOSIS — D508 Other iron deficiency anemias: Secondary | ICD-10-CM | POA: Diagnosis not present

## 2024-01-05 DIAGNOSIS — N186 End stage renal disease: Secondary | ICD-10-CM | POA: Diagnosis not present

## 2024-01-05 DIAGNOSIS — N2581 Secondary hyperparathyroidism of renal origin: Secondary | ICD-10-CM | POA: Diagnosis not present

## 2024-01-05 DIAGNOSIS — R079 Chest pain, unspecified: Secondary | ICD-10-CM | POA: Diagnosis not present

## 2024-01-08 DIAGNOSIS — N186 End stage renal disease: Secondary | ICD-10-CM | POA: Diagnosis not present

## 2024-01-08 DIAGNOSIS — E785 Hyperlipidemia, unspecified: Secondary | ICD-10-CM | POA: Diagnosis not present

## 2024-01-08 DIAGNOSIS — Z794 Long term (current) use of insulin: Secondary | ICD-10-CM | POA: Diagnosis not present

## 2024-01-08 DIAGNOSIS — I4819 Other persistent atrial fibrillation: Secondary | ICD-10-CM | POA: Diagnosis not present

## 2024-01-08 DIAGNOSIS — Z7901 Long term (current) use of anticoagulants: Secondary | ICD-10-CM | POA: Diagnosis not present

## 2024-01-08 DIAGNOSIS — I428 Other cardiomyopathies: Secondary | ICD-10-CM | POA: Diagnosis not present

## 2024-01-08 DIAGNOSIS — Z992 Dependence on renal dialysis: Secondary | ICD-10-CM | POA: Diagnosis not present

## 2024-01-08 DIAGNOSIS — J309 Allergic rhinitis, unspecified: Secondary | ICD-10-CM | POA: Diagnosis not present

## 2024-01-08 DIAGNOSIS — J4489 Other specified chronic obstructive pulmonary disease: Secondary | ICD-10-CM | POA: Diagnosis not present

## 2024-01-08 DIAGNOSIS — R079 Chest pain, unspecified: Secondary | ICD-10-CM | POA: Diagnosis not present

## 2024-01-08 DIAGNOSIS — M1990 Unspecified osteoarthritis, unspecified site: Secondary | ICD-10-CM | POA: Diagnosis not present

## 2024-01-08 DIAGNOSIS — D508 Other iron deficiency anemias: Secondary | ICD-10-CM | POA: Diagnosis not present

## 2024-01-08 DIAGNOSIS — I081 Rheumatic disorders of both mitral and tricuspid valves: Secondary | ICD-10-CM | POA: Diagnosis not present

## 2024-01-08 DIAGNOSIS — E1129 Type 2 diabetes mellitus with other diabetic kidney complication: Secondary | ICD-10-CM | POA: Diagnosis not present

## 2024-01-08 DIAGNOSIS — K219 Gastro-esophageal reflux disease without esophagitis: Secondary | ICD-10-CM | POA: Diagnosis not present

## 2024-01-08 DIAGNOSIS — N2581 Secondary hyperparathyroidism of renal origin: Secondary | ICD-10-CM | POA: Diagnosis not present

## 2024-01-08 DIAGNOSIS — G4733 Obstructive sleep apnea (adult) (pediatric): Secondary | ICD-10-CM | POA: Diagnosis not present

## 2024-01-08 DIAGNOSIS — I5042 Chronic combined systolic (congestive) and diastolic (congestive) heart failure: Secondary | ICD-10-CM | POA: Diagnosis not present

## 2024-01-08 DIAGNOSIS — I132 Hypertensive heart and chronic kidney disease with heart failure and with stage 5 chronic kidney disease, or end stage renal disease: Secondary | ICD-10-CM | POA: Diagnosis not present

## 2024-01-08 DIAGNOSIS — M940 Chondrocostal junction syndrome [Tietze]: Secondary | ICD-10-CM | POA: Diagnosis not present

## 2024-01-08 DIAGNOSIS — R072 Precordial pain: Secondary | ICD-10-CM | POA: Diagnosis not present

## 2024-01-08 DIAGNOSIS — E1122 Type 2 diabetes mellitus with diabetic chronic kidney disease: Secondary | ICD-10-CM | POA: Diagnosis not present

## 2024-01-08 DIAGNOSIS — J9601 Acute respiratory failure with hypoxia: Secondary | ICD-10-CM | POA: Diagnosis not present

## 2024-01-08 DIAGNOSIS — D631 Anemia in chronic kidney disease: Secondary | ICD-10-CM | POA: Diagnosis not present

## 2024-01-08 DIAGNOSIS — Z8616 Personal history of COVID-19: Secondary | ICD-10-CM | POA: Diagnosis not present

## 2024-01-09 ENCOUNTER — Telehealth (INDEPENDENT_AMBULATORY_CARE_PROVIDER_SITE_OTHER): Payer: Self-pay

## 2024-01-09 DIAGNOSIS — D631 Anemia in chronic kidney disease: Secondary | ICD-10-CM | POA: Diagnosis not present

## 2024-01-09 DIAGNOSIS — I081 Rheumatic disorders of both mitral and tricuspid valves: Secondary | ICD-10-CM | POA: Diagnosis not present

## 2024-01-09 DIAGNOSIS — J9601 Acute respiratory failure with hypoxia: Secondary | ICD-10-CM | POA: Diagnosis not present

## 2024-01-09 DIAGNOSIS — K219 Gastro-esophageal reflux disease without esophagitis: Secondary | ICD-10-CM | POA: Diagnosis not present

## 2024-01-09 DIAGNOSIS — M1990 Unspecified osteoarthritis, unspecified site: Secondary | ICD-10-CM | POA: Diagnosis not present

## 2024-01-09 DIAGNOSIS — R072 Precordial pain: Secondary | ICD-10-CM | POA: Diagnosis not present

## 2024-01-09 DIAGNOSIS — N186 End stage renal disease: Secondary | ICD-10-CM | POA: Diagnosis not present

## 2024-01-09 DIAGNOSIS — I4819 Other persistent atrial fibrillation: Secondary | ICD-10-CM | POA: Diagnosis not present

## 2024-01-09 DIAGNOSIS — M940 Chondrocostal junction syndrome [Tietze]: Secondary | ICD-10-CM | POA: Diagnosis not present

## 2024-01-09 DIAGNOSIS — J4489 Other specified chronic obstructive pulmonary disease: Secondary | ICD-10-CM | POA: Diagnosis not present

## 2024-01-09 DIAGNOSIS — I428 Other cardiomyopathies: Secondary | ICD-10-CM | POA: Diagnosis not present

## 2024-01-09 DIAGNOSIS — Z992 Dependence on renal dialysis: Secondary | ICD-10-CM | POA: Diagnosis not present

## 2024-01-09 DIAGNOSIS — I132 Hypertensive heart and chronic kidney disease with heart failure and with stage 5 chronic kidney disease, or end stage renal disease: Secondary | ICD-10-CM | POA: Diagnosis not present

## 2024-01-09 DIAGNOSIS — Z7901 Long term (current) use of anticoagulants: Secondary | ICD-10-CM | POA: Diagnosis not present

## 2024-01-09 DIAGNOSIS — J309 Allergic rhinitis, unspecified: Secondary | ICD-10-CM | POA: Diagnosis not present

## 2024-01-09 DIAGNOSIS — E785 Hyperlipidemia, unspecified: Secondary | ICD-10-CM | POA: Diagnosis not present

## 2024-01-09 DIAGNOSIS — G4733 Obstructive sleep apnea (adult) (pediatric): Secondary | ICD-10-CM | POA: Diagnosis not present

## 2024-01-09 DIAGNOSIS — I5042 Chronic combined systolic (congestive) and diastolic (congestive) heart failure: Secondary | ICD-10-CM | POA: Diagnosis not present

## 2024-01-09 DIAGNOSIS — E1122 Type 2 diabetes mellitus with diabetic chronic kidney disease: Secondary | ICD-10-CM | POA: Diagnosis not present

## 2024-01-09 DIAGNOSIS — Z8616 Personal history of COVID-19: Secondary | ICD-10-CM | POA: Diagnosis not present

## 2024-01-09 DIAGNOSIS — Z794 Long term (current) use of insulin: Secondary | ICD-10-CM | POA: Diagnosis not present

## 2024-01-09 NOTE — Telephone Encounter (Signed)
 Returned call to [name] and made aware of provider response also made aware that she is able to take guaifenesin  and they pulmonologist office will be reaching out to her to schedule

## 2024-01-09 NOTE — Telephone Encounter (Signed)
 Copied from CRM [id number]. Topic: General - Other >> Jan 09, 2024  9:17 AM Emylou G wrote: Reason for CRM: Floretta Huron Nurse w/[facility] said patient is on: chlorpheniramine-HYDROcodone  (TUSSIONEX) 10-8 MG/5ML and she said its too expensive - never picked it up.. is there alternative [phone] - her call back.Aaron Aas

## 2024-01-10 ENCOUNTER — Telehealth (INDEPENDENT_AMBULATORY_CARE_PROVIDER_SITE_OTHER): Payer: Self-pay | Admitting: Primary Care

## 2024-01-10 DIAGNOSIS — E1129 Type 2 diabetes mellitus with other diabetic kidney complication: Secondary | ICD-10-CM | POA: Diagnosis not present

## 2024-01-10 DIAGNOSIS — R079 Chest pain, unspecified: Secondary | ICD-10-CM | POA: Diagnosis not present

## 2024-01-10 DIAGNOSIS — D508 Other iron deficiency anemias: Secondary | ICD-10-CM | POA: Diagnosis not present

## 2024-01-10 DIAGNOSIS — D631 Anemia in chronic kidney disease: Secondary | ICD-10-CM | POA: Diagnosis not present

## 2024-01-10 DIAGNOSIS — N2581 Secondary hyperparathyroidism of renal origin: Secondary | ICD-10-CM | POA: Diagnosis not present

## 2024-01-10 DIAGNOSIS — N186 End stage renal disease: Secondary | ICD-10-CM | POA: Diagnosis not present

## 2024-01-10 DIAGNOSIS — Z992 Dependence on renal dialysis: Secondary | ICD-10-CM | POA: Diagnosis not present

## 2024-01-10 NOTE — Telephone Encounter (Signed)
 Called pt to confirm appt. Pt will be present.

## 2024-01-11 ENCOUNTER — Telehealth: Payer: Self-pay

## 2024-01-11 DIAGNOSIS — I428 Other cardiomyopathies: Secondary | ICD-10-CM | POA: Diagnosis not present

## 2024-01-11 DIAGNOSIS — Z794 Long term (current) use of insulin: Secondary | ICD-10-CM | POA: Diagnosis not present

## 2024-01-11 DIAGNOSIS — I081 Rheumatic disorders of both mitral and tricuspid valves: Secondary | ICD-10-CM | POA: Diagnosis not present

## 2024-01-11 DIAGNOSIS — M1990 Unspecified osteoarthritis, unspecified site: Secondary | ICD-10-CM | POA: Diagnosis not present

## 2024-01-11 DIAGNOSIS — I132 Hypertensive heart and chronic kidney disease with heart failure and with stage 5 chronic kidney disease, or end stage renal disease: Secondary | ICD-10-CM | POA: Diagnosis not present

## 2024-01-11 DIAGNOSIS — I5042 Chronic combined systolic (congestive) and diastolic (congestive) heart failure: Secondary | ICD-10-CM | POA: Diagnosis not present

## 2024-01-11 DIAGNOSIS — Z992 Dependence on renal dialysis: Secondary | ICD-10-CM | POA: Diagnosis not present

## 2024-01-11 DIAGNOSIS — Z7901 Long term (current) use of anticoagulants: Secondary | ICD-10-CM | POA: Diagnosis not present

## 2024-01-11 DIAGNOSIS — G4733 Obstructive sleep apnea (adult) (pediatric): Secondary | ICD-10-CM | POA: Diagnosis not present

## 2024-01-11 DIAGNOSIS — R072 Precordial pain: Secondary | ICD-10-CM | POA: Diagnosis not present

## 2024-01-11 DIAGNOSIS — M940 Chondrocostal junction syndrome [Tietze]: Secondary | ICD-10-CM | POA: Diagnosis not present

## 2024-01-11 DIAGNOSIS — E1122 Type 2 diabetes mellitus with diabetic chronic kidney disease: Secondary | ICD-10-CM | POA: Diagnosis not present

## 2024-01-11 DIAGNOSIS — N186 End stage renal disease: Secondary | ICD-10-CM | POA: Diagnosis not present

## 2024-01-11 DIAGNOSIS — J9601 Acute respiratory failure with hypoxia: Secondary | ICD-10-CM | POA: Diagnosis not present

## 2024-01-11 DIAGNOSIS — J4489 Other specified chronic obstructive pulmonary disease: Secondary | ICD-10-CM | POA: Diagnosis not present

## 2024-01-11 DIAGNOSIS — D631 Anemia in chronic kidney disease: Secondary | ICD-10-CM | POA: Diagnosis not present

## 2024-01-11 DIAGNOSIS — J309 Allergic rhinitis, unspecified: Secondary | ICD-10-CM | POA: Diagnosis not present

## 2024-01-11 DIAGNOSIS — K219 Gastro-esophageal reflux disease without esophagitis: Secondary | ICD-10-CM | POA: Diagnosis not present

## 2024-01-11 DIAGNOSIS — E785 Hyperlipidemia, unspecified: Secondary | ICD-10-CM | POA: Diagnosis not present

## 2024-01-11 DIAGNOSIS — I4819 Other persistent atrial fibrillation: Secondary | ICD-10-CM | POA: Diagnosis not present

## 2024-01-11 DIAGNOSIS — Z8616 Personal history of COVID-19: Secondary | ICD-10-CM | POA: Diagnosis not present

## 2024-01-11 NOTE — Telephone Encounter (Signed)
 Routing to office

## 2024-01-11 NOTE — Telephone Encounter (Signed)
 Copied from CRM [id number]. Topic: Clinical - Home Health Verbal Orders >> Jan 11, 2024  9:44 AM Opal Bill wrote: Caller/Agency: [facility] Callback Number: [phone] Service Requested: Occupational Therapy Frequency: 1x a week for 7 weeks Any new concerns about the patient? Yes.  8/10 back pain, fell last wed when she stood up. No EMS or hospitalization required. Pt requesting a 3 in 1 commode.

## 2024-01-12 DIAGNOSIS — Z992 Dependence on renal dialysis: Secondary | ICD-10-CM | POA: Diagnosis not present

## 2024-01-12 DIAGNOSIS — R079 Chest pain, unspecified: Secondary | ICD-10-CM | POA: Diagnosis not present

## 2024-01-12 DIAGNOSIS — E1129 Type 2 diabetes mellitus with other diabetic kidney complication: Secondary | ICD-10-CM | POA: Diagnosis not present

## 2024-01-12 DIAGNOSIS — D631 Anemia in chronic kidney disease: Secondary | ICD-10-CM | POA: Diagnosis not present

## 2024-01-12 DIAGNOSIS — N2581 Secondary hyperparathyroidism of renal origin: Secondary | ICD-10-CM | POA: Diagnosis not present

## 2024-01-12 DIAGNOSIS — D508 Other iron deficiency anemias: Secondary | ICD-10-CM | POA: Diagnosis not present

## 2024-01-12 DIAGNOSIS — N186 End stage renal disease: Secondary | ICD-10-CM | POA: Diagnosis not present

## 2024-01-15 DIAGNOSIS — R079 Chest pain, unspecified: Secondary | ICD-10-CM | POA: Diagnosis not present

## 2024-01-15 DIAGNOSIS — N186 End stage renal disease: Secondary | ICD-10-CM | POA: Diagnosis not present

## 2024-01-15 DIAGNOSIS — E1129 Type 2 diabetes mellitus with other diabetic kidney complication: Secondary | ICD-10-CM | POA: Diagnosis not present

## 2024-01-15 DIAGNOSIS — Z992 Dependence on renal dialysis: Secondary | ICD-10-CM | POA: Diagnosis not present

## 2024-01-15 DIAGNOSIS — D631 Anemia in chronic kidney disease: Secondary | ICD-10-CM | POA: Diagnosis not present

## 2024-01-15 DIAGNOSIS — N2581 Secondary hyperparathyroidism of renal origin: Secondary | ICD-10-CM | POA: Diagnosis not present

## 2024-01-15 DIAGNOSIS — D508 Other iron deficiency anemias: Secondary | ICD-10-CM | POA: Diagnosis not present

## 2024-01-15 NOTE — Telephone Encounter (Signed)
 Rx for beside commode has been sent to preferred fax #

## 2024-01-16 ENCOUNTER — Other Ambulatory Visit (INDEPENDENT_AMBULATORY_CARE_PROVIDER_SITE_OTHER): Payer: Self-pay | Admitting: Primary Care

## 2024-01-16 DIAGNOSIS — I4819 Other persistent atrial fibrillation: Secondary | ICD-10-CM | POA: Diagnosis not present

## 2024-01-16 DIAGNOSIS — M940 Chondrocostal junction syndrome [Tietze]: Secondary | ICD-10-CM | POA: Diagnosis not present

## 2024-01-16 DIAGNOSIS — Z8616 Personal history of COVID-19: Secondary | ICD-10-CM | POA: Diagnosis not present

## 2024-01-16 DIAGNOSIS — F32A Depression, unspecified: Secondary | ICD-10-CM

## 2024-01-16 DIAGNOSIS — E1142 Type 2 diabetes mellitus with diabetic polyneuropathy: Secondary | ICD-10-CM

## 2024-01-16 DIAGNOSIS — Z7901 Long term (current) use of anticoagulants: Secondary | ICD-10-CM | POA: Diagnosis not present

## 2024-01-16 DIAGNOSIS — M1990 Unspecified osteoarthritis, unspecified site: Secondary | ICD-10-CM | POA: Diagnosis not present

## 2024-01-16 DIAGNOSIS — I428 Other cardiomyopathies: Secondary | ICD-10-CM | POA: Diagnosis not present

## 2024-01-16 DIAGNOSIS — E1122 Type 2 diabetes mellitus with diabetic chronic kidney disease: Secondary | ICD-10-CM | POA: Diagnosis not present

## 2024-01-16 DIAGNOSIS — J4489 Other specified chronic obstructive pulmonary disease: Secondary | ICD-10-CM | POA: Diagnosis not present

## 2024-01-16 DIAGNOSIS — Z992 Dependence on renal dialysis: Secondary | ICD-10-CM | POA: Diagnosis not present

## 2024-01-16 DIAGNOSIS — E785 Hyperlipidemia, unspecified: Secondary | ICD-10-CM | POA: Diagnosis not present

## 2024-01-16 DIAGNOSIS — I132 Hypertensive heart and chronic kidney disease with heart failure and with stage 5 chronic kidney disease, or end stage renal disease: Secondary | ICD-10-CM | POA: Diagnosis not present

## 2024-01-16 DIAGNOSIS — R072 Precordial pain: Secondary | ICD-10-CM | POA: Diagnosis not present

## 2024-01-16 DIAGNOSIS — Z760 Encounter for issue of repeat prescription: Secondary | ICD-10-CM

## 2024-01-16 DIAGNOSIS — G4733 Obstructive sleep apnea (adult) (pediatric): Secondary | ICD-10-CM | POA: Diagnosis not present

## 2024-01-16 DIAGNOSIS — N186 End stage renal disease: Secondary | ICD-10-CM | POA: Diagnosis not present

## 2024-01-16 DIAGNOSIS — Z794 Long term (current) use of insulin: Secondary | ICD-10-CM | POA: Diagnosis not present

## 2024-01-16 DIAGNOSIS — I081 Rheumatic disorders of both mitral and tricuspid valves: Secondary | ICD-10-CM | POA: Diagnosis not present

## 2024-01-16 DIAGNOSIS — J309 Allergic rhinitis, unspecified: Secondary | ICD-10-CM | POA: Diagnosis not present

## 2024-01-16 DIAGNOSIS — D631 Anemia in chronic kidney disease: Secondary | ICD-10-CM | POA: Diagnosis not present

## 2024-01-16 DIAGNOSIS — K219 Gastro-esophageal reflux disease without esophagitis: Secondary | ICD-10-CM | POA: Diagnosis not present

## 2024-01-16 DIAGNOSIS — J9601 Acute respiratory failure with hypoxia: Secondary | ICD-10-CM | POA: Diagnosis not present

## 2024-01-16 DIAGNOSIS — I5042 Chronic combined systolic (congestive) and diastolic (congestive) heart failure: Secondary | ICD-10-CM | POA: Diagnosis not present

## 2024-01-16 NOTE — Telephone Encounter (Unsigned)
 Copied from CRM [id number]. Topic: Clinical - Medication Refill >> Jan 16, 2024 11:51 AM Adrianna P wrote: Medication: DULoxetine  (CYMBALTA ) 60 MG capsule  Has the patient contacted their pharmacy? Yes (Agent: If no, request that the patient contact the pharmacy for the refill. If patient does not wish to contact the pharmacy document the reason why and proceed with request.) (Agent: If yes, when and what did the pharmacy advise?)  This is the patient's preferred pharmacy:  [facility] [address] [place], [place] - [address] [place] [address] Phone: [phone] Fax: [phone]  [facility] - [place], [place] - [address] [place] [address] Phone: [phone] Fax: [phone]  [facility] [address] [place] [address] Phone: [phone] Fax: [phone]  Is this the correct pharmacy for this prescription? Yes If no, delete pharmacy and type the correct one.   Has the prescription been filled recently? No  Is the patient out of the medication? Yes  Has the patient been seen for an appointment in the last year OR does the patient have an upcoming appointment? Yes  Can we respond through MyChart? No  Agent: Please be advised that Rx refills may take up to 3 business days. We ask that you follow-up with your pharmacy.

## 2024-01-17 ENCOUNTER — Telehealth (INDEPENDENT_AMBULATORY_CARE_PROVIDER_SITE_OTHER): Payer: Self-pay | Admitting: Primary Care

## 2024-01-17 DIAGNOSIS — N2581 Secondary hyperparathyroidism of renal origin: Secondary | ICD-10-CM | POA: Diagnosis not present

## 2024-01-17 DIAGNOSIS — E1129 Type 2 diabetes mellitus with other diabetic kidney complication: Secondary | ICD-10-CM | POA: Diagnosis not present

## 2024-01-17 DIAGNOSIS — D631 Anemia in chronic kidney disease: Secondary | ICD-10-CM | POA: Diagnosis not present

## 2024-01-17 DIAGNOSIS — N186 End stage renal disease: Secondary | ICD-10-CM | POA: Diagnosis not present

## 2024-01-17 DIAGNOSIS — D508 Other iron deficiency anemias: Secondary | ICD-10-CM | POA: Diagnosis not present

## 2024-01-17 DIAGNOSIS — Z992 Dependence on renal dialysis: Secondary | ICD-10-CM | POA: Diagnosis not present

## 2024-01-17 DIAGNOSIS — R079 Chest pain, unspecified: Secondary | ICD-10-CM | POA: Diagnosis not present

## 2024-01-17 MED ORDER — DULOXETINE HCL 60 MG PO CPEP: 60.0000 mg | ORAL_CAPSULE | Freq: Every day | ORAL | 11 refills | Status: DC

## 2024-01-17 NOTE — Telephone Encounter (Signed)
 Spoke to pt about upcoming appt.. Will be present

## 2024-01-17 NOTE — Telephone Encounter (Signed)
 Requested Prescriptions  Pending Prescriptions Disp Refills   DULoxetine  (CYMBALTA ) 60 MG capsule 180 capsule 11    Sig: Take 1 capsule (60 mg total) by mouth daily.     Psychiatry: Antidepressants - SNRI - duloxetine  Failed - 01/17/2024  3:32 PM      Failed - Cr in normal range and within 360 days    Creatinine, Ser  Date Value Ref Range Status  12/27/2023 9.50 (H) 0.44 - 1.00 mg/dL Final   Creatinine, Urine  Date Value Ref Range Status  07/29/2021 97.28 mg/dL Final    Comment:    Performed at [facility], [address] [place], [place] [address]         Failed - eGFR is 30 or above and within 360 days    GFR calc Af Amer  Date Value Ref Range Status  12/24/2023 5 (L) >60 mL/min Final   GFR, Estimated  Date Value Ref Range Status  12/27/2023 4 (L) >60 mL/min Final    Comment:    (NOTE) Calculated using the CKD-EPI Creatinine Equation (2021)    eGFR  Date Value Ref Range Status  02/21/2023 4 (L) >59 mL/min/1.73 Final         Failed - Valid encounter within last 6 months    Recent Outpatient Visits           1 month ago Generalized anxiety disorder   [place] [facility] Marius Siemens, NP   2 months ago Cough productive of purulent sputum   [place] [facility] Marius Siemens, NP   1 year ago Generalized anxiety disorder   [place] [facility] Marius Siemens, NP   1 year ago Diabetes mellitus type 2 in obese [facility])   [place] [facility] Marius Siemens, NP   1 year ago Diabetes mellitus type 2 in obese [facility])   [facility] [place] Joaquin Mulberry, MD       Future Appointments             In 1 week Clearnce Curia, NP [facility], [place]            Passed - Completed PHQ-2 or PHQ-9 in the last 360 days      Passed - Last BP in normal range    BP Readings from  Last 1 Encounters:  12/27/23 109/84

## 2024-01-18 ENCOUNTER — Inpatient Hospital Stay (INDEPENDENT_AMBULATORY_CARE_PROVIDER_SITE_OTHER): Admitting: Primary Care

## 2024-01-18 DIAGNOSIS — J309 Allergic rhinitis, unspecified: Secondary | ICD-10-CM | POA: Diagnosis not present

## 2024-01-18 DIAGNOSIS — M940 Chondrocostal junction syndrome [Tietze]: Secondary | ICD-10-CM | POA: Diagnosis not present

## 2024-01-18 DIAGNOSIS — J4489 Other specified chronic obstructive pulmonary disease: Secondary | ICD-10-CM | POA: Diagnosis not present

## 2024-01-18 DIAGNOSIS — N186 End stage renal disease: Secondary | ICD-10-CM | POA: Diagnosis not present

## 2024-01-18 DIAGNOSIS — R072 Precordial pain: Secondary | ICD-10-CM | POA: Diagnosis not present

## 2024-01-18 DIAGNOSIS — Z992 Dependence on renal dialysis: Secondary | ICD-10-CM | POA: Diagnosis not present

## 2024-01-18 DIAGNOSIS — J9601 Acute respiratory failure with hypoxia: Secondary | ICD-10-CM | POA: Diagnosis not present

## 2024-01-18 DIAGNOSIS — I4819 Other persistent atrial fibrillation: Secondary | ICD-10-CM | POA: Diagnosis not present

## 2024-01-18 DIAGNOSIS — G4733 Obstructive sleep apnea (adult) (pediatric): Secondary | ICD-10-CM | POA: Diagnosis not present

## 2024-01-18 DIAGNOSIS — M1990 Unspecified osteoarthritis, unspecified site: Secondary | ICD-10-CM | POA: Diagnosis not present

## 2024-01-18 DIAGNOSIS — D631 Anemia in chronic kidney disease: Secondary | ICD-10-CM | POA: Diagnosis not present

## 2024-01-18 DIAGNOSIS — Z794 Long term (current) use of insulin: Secondary | ICD-10-CM | POA: Diagnosis not present

## 2024-01-18 DIAGNOSIS — I132 Hypertensive heart and chronic kidney disease with heart failure and with stage 5 chronic kidney disease, or end stage renal disease: Secondary | ICD-10-CM | POA: Diagnosis not present

## 2024-01-18 DIAGNOSIS — Z7901 Long term (current) use of anticoagulants: Secondary | ICD-10-CM | POA: Diagnosis not present

## 2024-01-18 DIAGNOSIS — Z8616 Personal history of COVID-19: Secondary | ICD-10-CM | POA: Diagnosis not present

## 2024-01-18 DIAGNOSIS — K219 Gastro-esophageal reflux disease without esophagitis: Secondary | ICD-10-CM | POA: Diagnosis not present

## 2024-01-18 DIAGNOSIS — E785 Hyperlipidemia, unspecified: Secondary | ICD-10-CM | POA: Diagnosis not present

## 2024-01-18 DIAGNOSIS — I428 Other cardiomyopathies: Secondary | ICD-10-CM | POA: Diagnosis not present

## 2024-01-18 DIAGNOSIS — I5042 Chronic combined systolic (congestive) and diastolic (congestive) heart failure: Secondary | ICD-10-CM | POA: Diagnosis not present

## 2024-01-18 DIAGNOSIS — E1122 Type 2 diabetes mellitus with diabetic chronic kidney disease: Secondary | ICD-10-CM | POA: Diagnosis not present

## 2024-01-18 DIAGNOSIS — I081 Rheumatic disorders of both mitral and tricuspid valves: Secondary | ICD-10-CM | POA: Diagnosis not present

## 2024-01-19 DIAGNOSIS — Z992 Dependence on renal dialysis: Secondary | ICD-10-CM | POA: Diagnosis not present

## 2024-01-19 DIAGNOSIS — N2581 Secondary hyperparathyroidism of renal origin: Secondary | ICD-10-CM | POA: Diagnosis not present

## 2024-01-19 DIAGNOSIS — E1122 Type 2 diabetes mellitus with diabetic chronic kidney disease: Secondary | ICD-10-CM | POA: Diagnosis not present

## 2024-01-19 DIAGNOSIS — N186 End stage renal disease: Secondary | ICD-10-CM | POA: Diagnosis not present

## 2024-01-19 DIAGNOSIS — R079 Chest pain, unspecified: Secondary | ICD-10-CM | POA: Diagnosis not present

## 2024-01-19 DIAGNOSIS — D508 Other iron deficiency anemias: Secondary | ICD-10-CM | POA: Diagnosis not present

## 2024-01-19 DIAGNOSIS — D631 Anemia in chronic kidney disease: Secondary | ICD-10-CM | POA: Diagnosis not present

## 2024-01-19 DIAGNOSIS — E1129 Type 2 diabetes mellitus with other diabetic kidney complication: Secondary | ICD-10-CM | POA: Diagnosis not present

## 2024-01-21 ENCOUNTER — Ambulatory Visit (INDEPENDENT_AMBULATORY_CARE_PROVIDER_SITE_OTHER): Admitting: Primary Care

## 2024-01-21 ENCOUNTER — Telehealth (INDEPENDENT_AMBULATORY_CARE_PROVIDER_SITE_OTHER): Payer: Self-pay | Admitting: Primary Care

## 2024-01-21 DIAGNOSIS — J9601 Acute respiratory failure with hypoxia: Secondary | ICD-10-CM | POA: Diagnosis not present

## 2024-01-21 DIAGNOSIS — E785 Hyperlipidemia, unspecified: Secondary | ICD-10-CM | POA: Diagnosis not present

## 2024-01-21 DIAGNOSIS — Z794 Long term (current) use of insulin: Secondary | ICD-10-CM | POA: Diagnosis not present

## 2024-01-21 DIAGNOSIS — N186 End stage renal disease: Secondary | ICD-10-CM | POA: Diagnosis not present

## 2024-01-21 DIAGNOSIS — J309 Allergic rhinitis, unspecified: Secondary | ICD-10-CM | POA: Diagnosis not present

## 2024-01-21 DIAGNOSIS — M940 Chondrocostal junction syndrome [Tietze]: Secondary | ICD-10-CM | POA: Diagnosis not present

## 2024-01-21 DIAGNOSIS — R072 Precordial pain: Secondary | ICD-10-CM | POA: Diagnosis not present

## 2024-01-21 DIAGNOSIS — K219 Gastro-esophageal reflux disease without esophagitis: Secondary | ICD-10-CM | POA: Diagnosis not present

## 2024-01-21 DIAGNOSIS — I428 Other cardiomyopathies: Secondary | ICD-10-CM | POA: Diagnosis not present

## 2024-01-21 DIAGNOSIS — D631 Anemia in chronic kidney disease: Secondary | ICD-10-CM | POA: Diagnosis not present

## 2024-01-21 DIAGNOSIS — G4733 Obstructive sleep apnea (adult) (pediatric): Secondary | ICD-10-CM | POA: Diagnosis not present

## 2024-01-21 DIAGNOSIS — E1122 Type 2 diabetes mellitus with diabetic chronic kidney disease: Secondary | ICD-10-CM | POA: Diagnosis not present

## 2024-01-21 DIAGNOSIS — I081 Rheumatic disorders of both mitral and tricuspid valves: Secondary | ICD-10-CM | POA: Diagnosis not present

## 2024-01-21 DIAGNOSIS — Z8616 Personal history of COVID-19: Secondary | ICD-10-CM | POA: Diagnosis not present

## 2024-01-21 DIAGNOSIS — J4489 Other specified chronic obstructive pulmonary disease: Secondary | ICD-10-CM | POA: Diagnosis not present

## 2024-01-21 DIAGNOSIS — M1990 Unspecified osteoarthritis, unspecified site: Secondary | ICD-10-CM | POA: Diagnosis not present

## 2024-01-21 DIAGNOSIS — Z992 Dependence on renal dialysis: Secondary | ICD-10-CM | POA: Diagnosis not present

## 2024-01-21 DIAGNOSIS — I4819 Other persistent atrial fibrillation: Secondary | ICD-10-CM | POA: Diagnosis not present

## 2024-01-21 DIAGNOSIS — I5042 Chronic combined systolic (congestive) and diastolic (congestive) heart failure: Secondary | ICD-10-CM | POA: Diagnosis not present

## 2024-01-21 DIAGNOSIS — Z7901 Long term (current) use of anticoagulants: Secondary | ICD-10-CM | POA: Diagnosis not present

## 2024-01-21 DIAGNOSIS — I132 Hypertensive heart and chronic kidney disease with heart failure and with stage 5 chronic kidney disease, or end stage renal disease: Secondary | ICD-10-CM | POA: Diagnosis not present

## 2024-01-21 NOTE — Telephone Encounter (Signed)
 Copied from CRM [id number]. Topic: General - Other >> Jan 21, 2024 12:38 PM Emylou G wrote: Reason for CRM: [name] . w/[facility] her number [phone] checking status of narrative that was not sent with the 3n1 commode?  [facility] ( [phone] ) is waiting for it? Pls fax it to them

## 2024-01-22 DIAGNOSIS — D508 Other iron deficiency anemias: Secondary | ICD-10-CM | POA: Diagnosis not present

## 2024-01-22 DIAGNOSIS — N186 End stage renal disease: Secondary | ICD-10-CM | POA: Diagnosis not present

## 2024-01-22 DIAGNOSIS — N2581 Secondary hyperparathyroidism of renal origin: Secondary | ICD-10-CM | POA: Diagnosis not present

## 2024-01-22 DIAGNOSIS — D631 Anemia in chronic kidney disease: Secondary | ICD-10-CM | POA: Diagnosis not present

## 2024-01-22 DIAGNOSIS — I953 Hypotension of hemodialysis: Secondary | ICD-10-CM | POA: Diagnosis not present

## 2024-01-22 DIAGNOSIS — E1129 Type 2 diabetes mellitus with other diabetic kidney complication: Secondary | ICD-10-CM | POA: Diagnosis not present

## 2024-01-22 DIAGNOSIS — Z992 Dependence on renal dialysis: Secondary | ICD-10-CM | POA: Diagnosis not present

## 2024-01-23 DIAGNOSIS — I5042 Chronic combined systolic (congestive) and diastolic (congestive) heart failure: Secondary | ICD-10-CM | POA: Diagnosis not present

## 2024-01-23 DIAGNOSIS — J9601 Acute respiratory failure with hypoxia: Secondary | ICD-10-CM | POA: Diagnosis not present

## 2024-01-23 DIAGNOSIS — E785 Hyperlipidemia, unspecified: Secondary | ICD-10-CM | POA: Diagnosis not present

## 2024-01-23 DIAGNOSIS — Z8616 Personal history of COVID-19: Secondary | ICD-10-CM | POA: Diagnosis not present

## 2024-01-23 DIAGNOSIS — K219 Gastro-esophageal reflux disease without esophagitis: Secondary | ICD-10-CM | POA: Diagnosis not present

## 2024-01-23 DIAGNOSIS — I132 Hypertensive heart and chronic kidney disease with heart failure and with stage 5 chronic kidney disease, or end stage renal disease: Secondary | ICD-10-CM | POA: Diagnosis not present

## 2024-01-23 DIAGNOSIS — M940 Chondrocostal junction syndrome [Tietze]: Secondary | ICD-10-CM | POA: Diagnosis not present

## 2024-01-23 DIAGNOSIS — M1990 Unspecified osteoarthritis, unspecified site: Secondary | ICD-10-CM | POA: Diagnosis not present

## 2024-01-23 DIAGNOSIS — I428 Other cardiomyopathies: Secondary | ICD-10-CM | POA: Diagnosis not present

## 2024-01-23 DIAGNOSIS — J4489 Other specified chronic obstructive pulmonary disease: Secondary | ICD-10-CM | POA: Diagnosis not present

## 2024-01-23 DIAGNOSIS — J309 Allergic rhinitis, unspecified: Secondary | ICD-10-CM | POA: Diagnosis not present

## 2024-01-23 DIAGNOSIS — I081 Rheumatic disorders of both mitral and tricuspid valves: Secondary | ICD-10-CM | POA: Diagnosis not present

## 2024-01-23 DIAGNOSIS — Z992 Dependence on renal dialysis: Secondary | ICD-10-CM | POA: Diagnosis not present

## 2024-01-23 DIAGNOSIS — Z7901 Long term (current) use of anticoagulants: Secondary | ICD-10-CM | POA: Diagnosis not present

## 2024-01-23 DIAGNOSIS — Z794 Long term (current) use of insulin: Secondary | ICD-10-CM | POA: Diagnosis not present

## 2024-01-23 DIAGNOSIS — D631 Anemia in chronic kidney disease: Secondary | ICD-10-CM | POA: Diagnosis not present

## 2024-01-23 DIAGNOSIS — R072 Precordial pain: Secondary | ICD-10-CM | POA: Diagnosis not present

## 2024-01-23 DIAGNOSIS — E1122 Type 2 diabetes mellitus with diabetic chronic kidney disease: Secondary | ICD-10-CM | POA: Diagnosis not present

## 2024-01-23 DIAGNOSIS — I4819 Other persistent atrial fibrillation: Secondary | ICD-10-CM | POA: Diagnosis not present

## 2024-01-23 DIAGNOSIS — G4733 Obstructive sleep apnea (adult) (pediatric): Secondary | ICD-10-CM | POA: Diagnosis not present

## 2024-01-23 DIAGNOSIS — N186 End stage renal disease: Secondary | ICD-10-CM | POA: Diagnosis not present

## 2024-01-24 DIAGNOSIS — N2581 Secondary hyperparathyroidism of renal origin: Secondary | ICD-10-CM | POA: Diagnosis not present

## 2024-01-24 DIAGNOSIS — I953 Hypotension of hemodialysis: Secondary | ICD-10-CM | POA: Diagnosis not present

## 2024-01-24 DIAGNOSIS — D508 Other iron deficiency anemias: Secondary | ICD-10-CM | POA: Diagnosis not present

## 2024-01-24 DIAGNOSIS — Z992 Dependence on renal dialysis: Secondary | ICD-10-CM | POA: Diagnosis not present

## 2024-01-24 DIAGNOSIS — D631 Anemia in chronic kidney disease: Secondary | ICD-10-CM | POA: Diagnosis not present

## 2024-01-24 DIAGNOSIS — N186 End stage renal disease: Secondary | ICD-10-CM | POA: Diagnosis not present

## 2024-01-24 DIAGNOSIS — E1129 Type 2 diabetes mellitus with other diabetic kidney complication: Secondary | ICD-10-CM | POA: Diagnosis not present

## 2024-01-24 NOTE — Progress Notes (Unsigned)
 Cardiology Office Note   Date:  01/25/2024  ID:  VETRA SHINALL, DOB 08-Sep-1955, MRN [id number] PCP: Leslie Siemens, NP  [facility] Providers Cardiologist:  Leslie Donning, MD Electrophysiologist:  Leslie Byes, MD     History of Present Illness [name] is a 68 y.o. female with history of atrial fibrillation, obesity, NICM with recovered LVEF, COPD, OSA, ESRD on HD, HLD, nonobstructive CAD, valvular heart disease s/p MV and TV repair 12/2020.  Prior mildly reduced LVEF 45 to 50% on LHC in 2019 with mild nonobstructive CAD.  Atrial fibrillation has been difficult to control previously failing Tikosyn  later switched to amiodarone .  Echo 11/2021 in setting of admission LVEF 35-40%, severe TR.  Echo 09/2023 LVEF 55 to 60%, RV SF mildly reduced s/p MV repair.    Admitted 10/2023 for recurrent A-fib with RVR in setting of URI.  She was given IV Amio and p.o. amnio increased to 400 mg twice daily for 1 week.  She had cardioversion 12/05/2023.  Admitted 5/5 - 12/27/2023 after presenting with chest pain onset during sleep.  Significant point tenderness at left anterior chest and left shoulder.  Coronary CTA with mild nonobstructive CAD calcium  score 58.9.  Pain felt to be related to costochondritis.  BNP elevated which improved with dialysis.  Presents today for follow up with family member. Felt poorly at HD yesterday with nausea and hypotension. EKG today reveals recurrent atrial flutter - she is symptomatic with fatigue, heart racing. Takes medication after HD around 12pm and then evening dose around 6pm. Usually goes to bed around 9-9:30pm. She reports having difficulty sleeping. Reports she does not have CPAP at home. Prior sleep study 2021 with no OSA  however [name] reported she barely slept and was ordered for repeat study never performed. Prior study 2008 and 2013 with sleep apnea. Blood pressure at home has been 110-150. Some ligthheadedness  and dizziness when BP is low. She does note exertional dyspnea which is overall unchanged from hospital discharge. Working with HH twice per week. Reviewed CTA results, she is tolerating Atorvastatin .   ROS: Please see the history of present illness.    All other systems reviewed and are negative.   Studies Reviewed EKG Interpretation Date/Time:  Friday January 25 2024 09:57:16 EDT Ventricular Rate:  133 PR Interval:    QRS Duration:  86 QT Interval:  336 QTC Calculation: 500 R Axis:   155  Text Interpretation: Atrial flutter with 2:1 A-V conduction Possible Right ventricular hypertrophy Nonspecific ST abnormality When compared with ECG of 25-Dec-2023 20:22, Atrial flutter has replaced Junctional rhythm Confirmed by [name] ([id number]) on 01/25/2024 10:07:16 AM    Cardiac Studies & Procedures   ______________________________________________________________________________________________ CARDIAC CATHETERIZATION  CARDIAC CATHETERIZATION 02/22/2022  Conclusion 1. Normal PCWP 2. Mild pulmonary venous hypertension. 3. Mildly elevated RA pressure. 4. Preserved cardiac output.   CARDIAC CATHETERIZATION 02/14/2022  Conclusion Findings:  RA = 27 RV = 62/25 PA = 60/27 (41) PCW = 25 (v = 35) Fick cardiac output/index = 6.8/2.8 PVR = 2.4 WU Ao sat = 90% PA sat = 45%, 49% PAPi = 1.2  Assessment: 1. Markedly elevated biventricular pressures 2. Low PAPI suggestive of RV dysfunction 3. PA sats are low but Fick CO maintained  Plan/Discussion:  Diurese further as renal function tolerates.  Leslie Oar, MD 2:29 PM     ECHOCARDIOGRAM  ECHOCARDIOGRAM COMPLETE 10/15/2023  Narrative ECHOCARDIOGRAM REPORT    Patient Name:   [name] Date  of Exam: 10/15/2023 Medical Rec #:  [id number]              Height:       66.0 in Accession #:    [id number]             Weight:       271.0 lb Date of Birth:  1956/08/05              BSA:          2.275 m Patient Age:     67 years               BP:           120/70 mmHg Patient Gender: F                      HR:           87 bpm. Exam Location:  Outpatient  Procedure: 2D Echo, Cardiac Doppler and Color Doppler (Both Spectral and Color Flow Doppler were utilized during procedure).  Indications:    Chest Pain  History:        Patient has prior history of Echocardiogram examinations. Arrythmias:Atrial Fibrillation, Signs/Symptoms:Chest Pain; Risk Factors:Hypertension, Diabetes and Non-Smoker. NICM; ESRD; Tricuspid Ring 12/29/2020; Mitral Repair 12/29/2020.  Sonographer:    [name] RDCS Referring Phys: [id number] [name]  IMPRESSIONS   1. 3D EF 69%. Left ventricular ejection fraction, by estimation, is 55 to 60%. The left ventricle has normal function. The left ventricle has no regional wall motion abnormalities. Left ventricular diastolic parameters are indeterminate. 2. Right ventricular systolic function is mildly reduced. The right ventricular size is mildly enlarged. 3. Left atrial size was severely dilated. 4. Right atrial size was severely dilated. 5. Post repair with ring Mild residual MR Mean diastolic gradient 8 mmHg at HR 68 bpm. The mitral valve has been repaired/replaced. No evidence of mitral valve regurgitation. No evidence of mitral stenosis. 6. Post repair with ring. The tricuspid valve is has been repaired/replaced. Tricuspid valve regurgitation is moderate. 7. The aortic valve is tricuspid. Aortic valve regurgitation is not visualized. No aortic stenosis is present. 8. The inferior vena cava is normal in size with greater than 50% respiratory variability, suggesting right atrial pressure of 3 mmHg.  FINDINGS Left Ventricle: 3D EF 69%. Left ventricular ejection fraction, by estimation, is 55 to 60%. The left ventricle has normal function. The left ventricle has no regional wall motion abnormalities. Global longitudinal strain performed but not reported based on  interpreter judgement due to suboptimal tracking. The left ventricular internal cavity size was normal in size. There is no left ventricular hypertrophy. Left ventricular diastolic parameters are indeterminate.  Right Ventricle: The right ventricular size is mildly enlarged. No increase in right ventricular wall thickness. Right ventricular systolic function is mildly reduced.  Left Atrium: Left atrial size was severely dilated.  Right Atrium: Right atrial size was severely dilated.  Pericardium: There is no evidence of pericardial effusion.  Mitral Valve: Post repair with ring Mild residual MR Mean diastolic gradient 8 mmHg at HR 68 bpm. The mitral valve has been repaired/replaced. No evidence of mitral valve regurgitation. No evidence of mitral valve stenosis. MV peak gradient, 21.5 mmHg. The mean mitral valve gradient is 8.0 mmHg.  Tricuspid Valve: Post repair with ring. The tricuspid valve is has been repaired/replaced. Tricuspid valve regurgitation is moderate . No evidence of tricuspid stenosis.  Aortic Valve: The aortic valve is tricuspid. Aortic valve regurgitation  is not visualized. No aortic stenosis is present.  Pulmonic Valve: The pulmonic valve was normal in structure. Pulmonic valve regurgitation is mild. No evidence of pulmonic stenosis.  Aorta: The aortic root is normal in size and structure.  Venous: The inferior vena cava is normal in size with greater than 50% respiratory variability, suggesting right atrial pressure of 3 mmHg.  IAS/Shunts: No atrial level shunt detected by color flow Doppler.  Additional Comments: 3D was performed not requiring image post processing on an independent workstation and was normal.   LEFT VENTRICLE PLAX 2D LVIDd:         4.35 cm      Diastology LVIDs:         3.61 cm      LV e' medial:  5.11 cm/s LV PW:         1.39 cm      LV e' lateral: 8.27 cm/s LV IVS:        1.09 cm LVOT diam:     1.90 cm LV SV:         36 LV SV Index:    16 LVOT Area:     2.84 cm  LV Volumes (MOD) LV vol d, MOD A2C: 103.0 ml LV vol d, MOD A4C: 114.0 ml LV vol s, MOD A2C: 48.4 ml LV vol s, MOD A4C: 61.5 ml LV SV MOD A2C:     54.6 ml LV SV MOD A4C:     114.0 ml LV SV MOD BP:      53.5 ml  RIGHT VENTRICLE RV Basal diam:  5.44 cm RV Mid diam:    4.23 cm RV S prime:     6.85 cm/s TAPSE (M-mode): 2.0 cm  LEFT ATRIUM              Index        RIGHT ATRIUM           Index LA Vol (A2C):   115.0 ml 50.54 ml/m  RA Area:     32.00 cm LA Vol (A4C):   85.9 ml  37.75 ml/m  RA Volume:   118.00 ml 51.86 ml/m LA Biplane Vol: 100.0 ml 43.95 ml/m AORTIC VALVE LVOT Vmax:   81.90 cm/s LVOT Vmean:  54.600 cm/s LVOT VTI:    0.126 m  AORTA Ao Root diam: 3.20 cm  MITRAL VALVE               TRICUSPID VALVE MV Area (PHT): 2.89 cm    TR Peak grad:   35.5 mmHg MV Area VTI:   0.68 cm    TR Vmax:        298.00 cm/s MV Peak grad:  21.5 mmHg MV Mean grad:  8.0 mmHg    SHUNTS MV Vmax:       2.32 m/s    Systemic VTI:  0.13 m MV Vmean:      119.0 cm/s  Systemic Diam: 1.90 cm  Janelle Mediate MD Electronically signed by Janelle Mediate MD Signature Date/Time: 10/15/2023/11:18:50 AM    Final   TEE  ECHO TEE 01/20/2022  Narrative TRANSESOPHOGEAL ECHO REPORT    Patient Name:   SHANEQUIA KENDRICK Date of Exam: 01/20/2022 Medical Rec #:  [id number]      Height:       67.0 in Accession #:    [id number]     Weight:       308.0 lb Date of Birth:  1955-10-31      BSA:  2.430 m Patient Age:    65 years       BP:           122/58 mmHg Patient Gender: F              HR:           63 bpm. Exam Location:  Inpatient  Procedure: Transesophageal Echo, Color Doppler, Cardiac Doppler and 3D Echo  Indications:     Tricuspid regurgitation  History:         Patient has prior history of Echocardiogram examinations, most recent 11/22/2021. CHF, Mitral Valve Disease and TV Disease; Risk Factors:Sleep Apnea, Dyslipidemia, Diabetes and Hypertension. 30 mm  CARBOMEDICS ANNULOFLEX RING prosthetic annuloplasty ring present in the mitral position. Procedure Date: 12/29/20. Tricuspid annuloplasty repair. The tricuspid valve is has been repaired/replaced.  Sonographer:     Jasmine Mesi Sonographer#2:   Crissie Dome RDCS (AE) Referring Phys:  [id number] AMY D CLEGG Diagnosing Phys: Leslie Oar MD  PROCEDURE: After discussion of the risks and benefits of a TEE, an informed consent was obtained from the patient. The transesophogeal probe was passed without difficulty through the esophogus of the patient. Sedation performed by different physician. The patient was monitored while under deep sedation. Anesthestetic sedation was provided intravenously by Anesthesiology: 300.51mg  of Propofol , 40mg  of Lidocaine . Image quality was good. The patient developed no complications during the procedure.  IMPRESSIONS   1. Left ventricular ejection fraction, by estimation, is 55%. The left ventricle has severely decreased function. The left ventricle has no regional wall motion abnormalities. 2. Right ventricular systolic function is severely reduced. The right ventricular size is severely enlarged. 3. Left atrial size was severely dilated. No left atrial/left atrial appendage thrombus was detected. 4. Right atrial size was severely dilated. 5. The mitral valve has been repaired/replaced. Trivial mitral valve regurgitation. Mild mitral stenosis. The mean mitral valve gradient is 3.0 mmHg. 6. The RA and RV are severely dilated. The tricuspid valve leaflets do no coapt. The tricuspid valve is has been repaired/replaced. The tricuspid valve is status post repair with an annuloplasty ring. Tricuspid valve regurgitation is severe. 7. The aortic valve is tricuspid. Aortic valve regurgitation is not visualized.  FINDINGS Left Ventricle: Left ventricular ejection fraction, by estimation, is 55%. The left ventricle has severely decreased function. The left ventricle has no  regional wall motion abnormalities. The left ventricular internal cavity size was normal in size.  Right Ventricle: The right ventricular size is severely enlarged. No increase in right ventricular wall thickness. Right ventricular systolic function is severely reduced.  Left Atrium: Left atrial size was severely dilated. No left atrial/left atrial appendage thrombus was detected.  Right Atrium: Right atrial size was severely dilated.  Pericardium: There is no evidence of pericardial effusion.  Mitral Valve: The mitral valve has been repaired/replaced. Trivial mitral valve regurgitation. There is a bioprosthetic valve present in the mitral position. Mild mitral valve stenosis. MV peak gradient, 14.1 mmHg. The mean mitral valve gradient is 3.0 mmHg.  Tricuspid Valve: The RA and RV are severely dilated. The tricuspid valve leaflets do no coapt. The tricuspid valve is has been repaired/replaced. Tricuspid valve regurgitation is severe. No evidence of tricuspid stenosis. The tricuspid valve is status post repair with an annuloplasty ring.  Aortic Valve: The aortic valve is tricuspid. Aortic valve regurgitation is not visualized.  Pulmonic Valve: The pulmonic valve was normal in structure. Pulmonic valve regurgitation is trivial.  Aorta: The ascending aorta was not well visualized and the  aortic root is normal in size and structure.  IAS/Shunts: No atrial level shunt detected by color flow Doppler.   MITRAL VALVE            TRICUSPID VALVE MV Peak grad: 14.1 mmHg TV Peak grad:   3.5 mmHg MV Mean grad: 3.0 mmHg  TV Mean grad:   1.5 mmHg MV Vmax:      1.88 m/s  TV Vmax:        0.94 m/s MV Vmean:     73.6 cm/s TV Vmean:       64.4 cm/s TV VTI:         0.28 msec TR Peak grad:   31.4 mmHg TR Vmax:        280.00 cm/s  Leslie Oar MD Electronically signed by Leslie Oar MD Signature Date/Time: 01/20/2022/1:55:36 PM    Final    CT SCANS  CT CORONARY MORPH W/CTA COR W/SCORE  12/26/2023  Addendum 12/27/2023  2:42 PM ADDENDUM REPORT: 12/27/2023 14:39  EXAM: OVER-READ INTERPRETATION  CT CHEST  The following report is an over-read performed by radiologist Dr. Collie Days [facility] on 12/27/2023. This over-read does not include interpretation of cardiac or coronary anatomy or pathology. The coronary CTA interpretation by the cardiologist is attached.  COMPARISON:  Dec 24, 2023.  FINDINGS: The visualized portions of the extracardiac vascular structures are unremarkable. Visualized mediastinum is unremarkable. Visualized portion of upper abdomen is unremarkable. Visualized pulmonary parenchyma is unremarkable. Visualized skeleton is unremarkable.  IMPRESSION: No definite abnormality seen involving the visualized portions of the extracardiac structures of the chest.   Electronically Signed By: Rosalene Colon M.D. On: 12/27/2023 14:39  Narrative CLINICAL DATA:  This is a 68 year old female with anginal symptoms  EXAM: Cardiac/Coronary  CTA  TECHNIQUE: The patient was scanned on a [employer].  FINDINGS: A 100 kV prospective scan was triggered in the descending thoracic aorta at 111 HU's. Axial non-contrast 3 mm slices were carried out through the heart. The data set was analyzed on a dedicated work station and scored using the Agatson method. Gantry rotation speed was 250 msecs and collimation was .6 mm. No beta blockade and 0.8 mg of sl NTG was given. The 3D data set was reconstructed in 5% intervals of the 67-82 % of the R-R cycle. Diastolic phases were analyzed on a dedicated work station using MPR, MIP and VRT modes. The patient received 80 cc of contrast.  Image Quality: Fair with misregistration artifact.  Aorta: Normal size.  No calcifications.  No dissection.  Aortic Valve:  Trileaflet.  No calcifications.  Coronary Arteries:  Normal coronary origin.  Right Dominance.  RCA is a large dominant artery that  gives rise to PDA and PLA. Proximal RCA with no plaques. The proximal to mid RCA with noted stair step artifact with poor visibility at that portion of the vessel with no apparent plaque plaque. The mid RCA with mild (24-49%) focal calcified plaque. Distal RCA with no plaques. PDA and PLA with no plaques.  Left main is a large artery that gives rise to LAD and LCX arteries.  LAD is a large vessel. The proximal LAD with no plaques. Noted stair-step artifact in the mid LAD with no apparent plaques.  D1 and D2 are small caliber vessels.  LCX is a non-dominant artery that gives rise to one large OM1 branch. The proximal LCX due to the Left atrial appendage closure device is not well visualized but there  seems to be a focal soft plaque in the proximal LCX which appears to be mild. There is a focal minimal (<24%) soft plaque in the mid portion of the LCX vessel. The OM1 vessel with mild (24-49%) focal soft plaque.  Coronary Calcium  Score:  Left main: 0  Left anterior descending artery: 0  Left circumflex artery: 0  Right coronary artery: 58.9  Total: 58.9  Percentile: 78  Other findings:  Normal pulmonary vein drainage into the left atrium.  The pulmonary artery is mildly dilated (3.25 cm).  Status post annuloplasty of the mitral and tricuspid valve, annuloplasty ring well seated.  Status post left atrial appendage closure device.  Right atrium and right ventricle are both enlarged.  IMPRESSION: 1. Coronary calcium  score of 58.9. This was 88 percentile for age and sex matched control.  2. Normal coronary origin with right dominance.  3. CAD-RADS 2. Mild non-obstructive CAD (25-49%). Consider non-atherosclerotic causes of chest pain. Consider preventive therapy and risk factor modification.  4. Total plaque volume 56 mm3 which is 26th percentile for age- and sex-matched controls (calcified plaque 15 mm3; non-calcified plaque 41 mm3,Low attenuation 1 mm3).  5.  The pulmonary artery is mildly dilated (3.25 cm).  6. Status post annuloplasty of the mitral and tricuspid valve, annuloplasty ring well seated.  7. Status post left atrial appendage closure device.  8. Right atrium and right ventricle are both enlarged.  The noncardiac portion of this study will be interpreted in separate report by the radiologist.  Electronically Signed: By: Kardie  Tobb D.O. On: 12/26/2023 17:02     ______________________________________________________________________________________________      Risk Assessment/Calculations  CHA2DS2-VASc Score = 6   This indicates a 9.7% annual risk of stroke. The patient's score is based upon: CHF History: 1 HTN History: 1 Diabetes History: 1 Stroke History: 0 Vascular Disease History: 1 Age Score: 1 Gender Score: 1            Physical Exam VS:  BP 114/68 (BP Location: Right Arm, Patient Position: Sitting, Cuff Size: Normal)   Pulse (!) 131   Ht 5\' 7"  (1.702 m)   Wt 276 lb 12.8 oz (125.6 kg)   BMI 43.35 kg/m    Wt Readings from Last 3 Encounters:  01/25/24 276 lb 12.8 oz (125.6 kg)  12/27/23 271 lb 13.2 oz (123.3 kg)  12/05/23 273 lb (123.8 kg)    GEN: Well nourished, well developed in no acute distress, overweight NECK: No JVD; No carotid bruits CARDIAC: IRIR, tachyardic, , no murmurs, rubs, gallops RESPIRATORY:  Clear to auscultation without rales, wheezing or rhonchi  ABDOMEN: Soft, non-tender, non-distended EXTREMITIES:  No edema; No deformity   ASSESSMENT AND PLAN  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 Grossly euvolemic on exam. GDMT limited by ESRD and BP. Continue Metoprolol  tartrate 25mg  BID. Diuresis per HD.  ESRD on HD- Per nephrology team.  Persistent A-fib/hypercoagulable state- Recurrent atrial flutter today symptomatic with dyspnea, fatigue. Reviewed with Minnie Amber, PA in AFib Clinic. Difficult case. Will plan to increase Metoprolol  tartrate from 12.5mg  to 25mg  BID. Continue Amiodarone  200mg  BID, Eliquis  5mg  BID. TSH/T4, BMP, mag today. Will schedule prompt follow up with AFib clinic in 1-2 weeks for discussion of repeat DCCV and follow up after that with Dr. Marven Slimmer for discussion of possible AV node ablation.   Nonobstructive CAD/HLD, LDL goal less than 70- Stable with no  anginal symptoms. No indication for ischemic evaluation.  GDMT Atorvastatin  40mg  daily (refill provided) and Metoprolol  tartrate 25mg  BID.   OSA- Reports snoring, daytime somnolence. Sleep study 2008 and 2013 with OSA, has not been on CPAP in many years. Felt better in hospital when on CPAP. Sleep study 2021 no OSA but noted not much sleep. Repeat split night sleep study.   DM2 - Continue to follow with PCP.        Dispo: follow up with AFib clinic in 1-2 weeks and with Dr. Veryl Gottron in 3 months  Signed, Clearnce Curia, NP

## 2024-01-25 ENCOUNTER — Other Ambulatory Visit (INDEPENDENT_AMBULATORY_CARE_PROVIDER_SITE_OTHER): Payer: Self-pay | Admitting: Primary Care

## 2024-01-25 ENCOUNTER — Encounter (HOSPITAL_BASED_OUTPATIENT_CLINIC_OR_DEPARTMENT_OTHER): Payer: Self-pay | Admitting: Family

## 2024-01-25 ENCOUNTER — Ambulatory Visit (INDEPENDENT_AMBULATORY_CARE_PROVIDER_SITE_OTHER): Admitting: Family

## 2024-01-25 VITALS — BP 114/68 | HR 131 | Ht 67.0 in | Wt 276.8 lb

## 2024-01-25 DIAGNOSIS — I82721 Chronic embolism and thrombosis of deep veins of right upper extremity: Secondary | ICD-10-CM

## 2024-01-25 DIAGNOSIS — Z79899 Other long term (current) drug therapy: Secondary | ICD-10-CM | POA: Diagnosis not present

## 2024-01-25 DIAGNOSIS — I428 Other cardiomyopathies: Secondary | ICD-10-CM | POA: Diagnosis not present

## 2024-01-25 DIAGNOSIS — E785 Hyperlipidemia, unspecified: Secondary | ICD-10-CM

## 2024-01-25 DIAGNOSIS — I4819 Other persistent atrial fibrillation: Secondary | ICD-10-CM | POA: Diagnosis not present

## 2024-01-25 DIAGNOSIS — D6859 Other primary thrombophilia: Secondary | ICD-10-CM | POA: Diagnosis not present

## 2024-01-25 DIAGNOSIS — I10 Essential (primary) hypertension: Secondary | ICD-10-CM | POA: Diagnosis not present

## 2024-01-25 DIAGNOSIS — R42 Dizziness and giddiness: Secondary | ICD-10-CM

## 2024-01-25 DIAGNOSIS — G4733 Obstructive sleep apnea (adult) (pediatric): Secondary | ICD-10-CM

## 2024-01-25 DIAGNOSIS — I4821 Permanent atrial fibrillation: Secondary | ICD-10-CM

## 2024-01-25 DIAGNOSIS — I2510 Atherosclerotic heart disease of native coronary artery without angina pectoris: Secondary | ICD-10-CM | POA: Diagnosis not present

## 2024-01-25 DIAGNOSIS — J449 Chronic obstructive pulmonary disease, unspecified: Secondary | ICD-10-CM

## 2024-01-25 MED ORDER — METOPROLOL TARTRATE 25 MG PO TABS: 25.0000 mg | ORAL_TABLET | Freq: Two times a day (BID) | ORAL | 3 refills | Status: DC

## 2024-01-25 MED ORDER — ATORVASTATIN CALCIUM 40 MG PO TABS: 40.0000 mg | ORAL_TABLET | Freq: Every day | ORAL | 3 refills | Status: DC

## 2024-01-25 NOTE — Patient Instructions (Addendum)
 Medication Instructions:   INCREASE Metoprolol  to 25mg  twice per day  *If you need a refill on your cardiac medications before your next appointment, please call your pharmacy*  Lab Work: Your physician recommends that you return for lab work today: TSH, T4, CBC, BMP, Mag Testing/Procedures: Your EKG today shows atrial flutter  Follow-Up: Your next appointment:   Follow up with Dr. Veryl Gottron in 3 months and follow up with EP/AFib clinic pending Dr. Candace Cerise recommendations

## 2024-01-26 DIAGNOSIS — E1129 Type 2 diabetes mellitus with other diabetic kidney complication: Secondary | ICD-10-CM | POA: Diagnosis not present

## 2024-01-26 DIAGNOSIS — N2581 Secondary hyperparathyroidism of renal origin: Secondary | ICD-10-CM | POA: Diagnosis not present

## 2024-01-26 DIAGNOSIS — Z992 Dependence on renal dialysis: Secondary | ICD-10-CM | POA: Diagnosis not present

## 2024-01-26 DIAGNOSIS — D508 Other iron deficiency anemias: Secondary | ICD-10-CM | POA: Diagnosis not present

## 2024-01-26 DIAGNOSIS — I953 Hypotension of hemodialysis: Secondary | ICD-10-CM | POA: Diagnosis not present

## 2024-01-26 DIAGNOSIS — D631 Anemia in chronic kidney disease: Secondary | ICD-10-CM | POA: Diagnosis not present

## 2024-01-26 DIAGNOSIS — N186 End stage renal disease: Secondary | ICD-10-CM | POA: Diagnosis not present

## 2024-01-26 LAB — BASIC METABOLIC PANEL WITH GFR
BUN/Creatinine Ratio: 4 — ABNORMAL LOW (ref 12–28)
BUN: 24 mg/dL (ref 8–27)
CO2: 27 mmol/L (ref 20–29)
Calcium: 10.2 mg/dL (ref 8.7–10.3)
Chloride: 90 mmol/L — ABNORMAL LOW (ref 96–106)
Creatinine, Ser: 6.41 mg/dL — ABNORMAL HIGH (ref 0.57–1.00)
Glucose: 138 mg/dL — ABNORMAL HIGH (ref 70–99)
Potassium: 4.1 mmol/L (ref 3.5–5.2)
Sodium: 138 mmol/L (ref 134–144)
eGFR: 7 mL/min/{1.73_m2} — ABNORMAL LOW (ref >59)

## 2024-01-26 LAB — MAGNESIUM: Magnesium: 2.2 mg/dL (ref 1.6–2.3)

## 2024-01-26 LAB — TSH: TSH: 2.43 u[IU]/mL (ref 0.450–4.500)

## 2024-01-26 LAB — T4: T4, Total: 11.2 ug/dL (ref 4.5–12.0)

## 2024-01-28 ENCOUNTER — Ambulatory Visit (HOSPITAL_BASED_OUTPATIENT_CLINIC_OR_DEPARTMENT_OTHER): Payer: Self-pay | Admitting: Family

## 2024-01-29 DIAGNOSIS — I953 Hypotension of hemodialysis: Secondary | ICD-10-CM | POA: Diagnosis not present

## 2024-01-29 DIAGNOSIS — D631 Anemia in chronic kidney disease: Secondary | ICD-10-CM | POA: Diagnosis not present

## 2024-01-29 DIAGNOSIS — D508 Other iron deficiency anemias: Secondary | ICD-10-CM | POA: Diagnosis not present

## 2024-01-29 DIAGNOSIS — N2581 Secondary hyperparathyroidism of renal origin: Secondary | ICD-10-CM | POA: Diagnosis not present

## 2024-01-29 DIAGNOSIS — Z992 Dependence on renal dialysis: Secondary | ICD-10-CM | POA: Diagnosis not present

## 2024-01-29 DIAGNOSIS — E1129 Type 2 diabetes mellitus with other diabetic kidney complication: Secondary | ICD-10-CM | POA: Diagnosis not present

## 2024-01-29 DIAGNOSIS — N186 End stage renal disease: Secondary | ICD-10-CM | POA: Diagnosis not present

## 2024-01-31 ENCOUNTER — Other Ambulatory Visit (INDEPENDENT_AMBULATORY_CARE_PROVIDER_SITE_OTHER): Payer: Self-pay | Admitting: Primary Care

## 2024-01-31 DIAGNOSIS — D631 Anemia in chronic kidney disease: Secondary | ICD-10-CM | POA: Diagnosis not present

## 2024-01-31 DIAGNOSIS — D508 Other iron deficiency anemias: Secondary | ICD-10-CM | POA: Diagnosis not present

## 2024-01-31 DIAGNOSIS — N186 End stage renal disease: Secondary | ICD-10-CM | POA: Diagnosis not present

## 2024-01-31 DIAGNOSIS — N2581 Secondary hyperparathyroidism of renal origin: Secondary | ICD-10-CM | POA: Diagnosis not present

## 2024-01-31 DIAGNOSIS — J0110 Acute frontal sinusitis, unspecified: Secondary | ICD-10-CM

## 2024-01-31 DIAGNOSIS — E1129 Type 2 diabetes mellitus with other diabetic kidney complication: Secondary | ICD-10-CM | POA: Diagnosis not present

## 2024-01-31 DIAGNOSIS — Z992 Dependence on renal dialysis: Secondary | ICD-10-CM | POA: Diagnosis not present

## 2024-01-31 DIAGNOSIS — I953 Hypotension of hemodialysis: Secondary | ICD-10-CM | POA: Diagnosis not present

## 2024-01-31 DIAGNOSIS — Z760 Encounter for issue of repeat prescription: Secondary | ICD-10-CM

## 2024-02-01 ENCOUNTER — Ambulatory Visit: Payer: Self-pay

## 2024-02-01 ENCOUNTER — Other Ambulatory Visit: Payer: Self-pay

## 2024-02-01 ENCOUNTER — Emergency Department (HOSPITAL_COMMUNITY): Admission: EM | Admit: 2024-02-01 | Discharge: 2024-02-01 | Disposition: A | Discharge status: Home / Self Care

## 2024-02-01 ENCOUNTER — Encounter (HOSPITAL_COMMUNITY): Payer: Self-pay

## 2024-02-01 ENCOUNTER — Telehealth (INDEPENDENT_AMBULATORY_CARE_PROVIDER_SITE_OTHER): Payer: Self-pay | Admitting: Primary Care

## 2024-02-01 ENCOUNTER — Emergency Department (HOSPITAL_COMMUNITY)

## 2024-02-01 DIAGNOSIS — E1122 Type 2 diabetes mellitus with diabetic chronic kidney disease: Secondary | ICD-10-CM | POA: Insufficient documentation

## 2024-02-01 DIAGNOSIS — R0602 Shortness of breath: Secondary | ICD-10-CM | POA: Diagnosis not present

## 2024-02-01 DIAGNOSIS — Z7984 Long term (current) use of oral hypoglycemic drugs: Secondary | ICD-10-CM | POA: Insufficient documentation

## 2024-02-01 DIAGNOSIS — N186 End stage renal disease: Secondary | ICD-10-CM | POA: Insufficient documentation

## 2024-02-01 DIAGNOSIS — Z79899 Other long term (current) drug therapy: Secondary | ICD-10-CM | POA: Insufficient documentation

## 2024-02-01 DIAGNOSIS — R072 Precordial pain: Secondary | ICD-10-CM | POA: Diagnosis not present

## 2024-02-01 DIAGNOSIS — M1990 Unspecified osteoarthritis, unspecified site: Secondary | ICD-10-CM | POA: Diagnosis not present

## 2024-02-01 DIAGNOSIS — M940 Chondrocostal junction syndrome [Tietze]: Secondary | ICD-10-CM | POA: Diagnosis not present

## 2024-02-01 DIAGNOSIS — Z7901 Long term (current) use of anticoagulants: Secondary | ICD-10-CM | POA: Insufficient documentation

## 2024-02-01 DIAGNOSIS — Z992 Dependence on renal dialysis: Secondary | ICD-10-CM | POA: Diagnosis not present

## 2024-02-01 DIAGNOSIS — D631 Anemia in chronic kidney disease: Secondary | ICD-10-CM | POA: Diagnosis not present

## 2024-02-01 DIAGNOSIS — I480 Paroxysmal atrial fibrillation: Secondary | ICD-10-CM | POA: Diagnosis not present

## 2024-02-01 DIAGNOSIS — I4819 Other persistent atrial fibrillation: Secondary | ICD-10-CM | POA: Diagnosis not present

## 2024-02-01 DIAGNOSIS — Z794 Long term (current) use of insulin: Secondary | ICD-10-CM | POA: Diagnosis not present

## 2024-02-01 DIAGNOSIS — I517 Cardiomegaly: Secondary | ICD-10-CM | POA: Diagnosis not present

## 2024-02-01 DIAGNOSIS — I132 Hypertensive heart and chronic kidney disease with heart failure and with stage 5 chronic kidney disease, or end stage renal disease: Secondary | ICD-10-CM | POA: Insufficient documentation

## 2024-02-01 DIAGNOSIS — J4489 Other specified chronic obstructive pulmonary disease: Secondary | ICD-10-CM | POA: Diagnosis not present

## 2024-02-01 DIAGNOSIS — J309 Allergic rhinitis, unspecified: Secondary | ICD-10-CM | POA: Diagnosis not present

## 2024-02-01 DIAGNOSIS — R531 Weakness: Secondary | ICD-10-CM | POA: Diagnosis not present

## 2024-02-01 DIAGNOSIS — R0789 Other chest pain: Secondary | ICD-10-CM | POA: Diagnosis not present

## 2024-02-01 DIAGNOSIS — J9811 Atelectasis: Secondary | ICD-10-CM | POA: Diagnosis not present

## 2024-02-01 DIAGNOSIS — E785 Hyperlipidemia, unspecified: Secondary | ICD-10-CM | POA: Diagnosis not present

## 2024-02-01 DIAGNOSIS — I428 Other cardiomyopathies: Secondary | ICD-10-CM | POA: Diagnosis not present

## 2024-02-01 DIAGNOSIS — I5032 Chronic diastolic (congestive) heart failure: Secondary | ICD-10-CM | POA: Insufficient documentation

## 2024-02-01 DIAGNOSIS — I959 Hypotension, unspecified: Secondary | ICD-10-CM | POA: Diagnosis not present

## 2024-02-01 DIAGNOSIS — I5042 Chronic combined systolic (congestive) and diastolic (congestive) heart failure: Secondary | ICD-10-CM | POA: Diagnosis not present

## 2024-02-01 DIAGNOSIS — J9601 Acute respiratory failure with hypoxia: Secondary | ICD-10-CM | POA: Diagnosis not present

## 2024-02-01 DIAGNOSIS — Z8616 Personal history of COVID-19: Secondary | ICD-10-CM | POA: Diagnosis not present

## 2024-02-01 DIAGNOSIS — R739 Hyperglycemia, unspecified: Secondary | ICD-10-CM | POA: Diagnosis not present

## 2024-02-01 DIAGNOSIS — R079 Chest pain, unspecified: Secondary | ICD-10-CM | POA: Diagnosis not present

## 2024-02-01 DIAGNOSIS — I081 Rheumatic disorders of both mitral and tricuspid valves: Secondary | ICD-10-CM | POA: Diagnosis not present

## 2024-02-01 DIAGNOSIS — K219 Gastro-esophageal reflux disease without esophagitis: Secondary | ICD-10-CM | POA: Diagnosis not present

## 2024-02-01 DIAGNOSIS — G4733 Obstructive sleep apnea (adult) (pediatric): Secondary | ICD-10-CM | POA: Diagnosis not present

## 2024-02-01 DIAGNOSIS — R0989 Other specified symptoms and signs involving the circulatory and respiratory systems: Secondary | ICD-10-CM | POA: Diagnosis not present

## 2024-02-01 DIAGNOSIS — R001 Bradycardia, unspecified: Secondary | ICD-10-CM | POA: Diagnosis not present

## 2024-02-01 LAB — BASIC METABOLIC PANEL WITH GFR
Anion gap: 18 — ABNORMAL HIGH (ref 5–15)
BUN: 29 mg/dL — ABNORMAL HIGH (ref 8–23)
CO2: 26 mmol/L (ref 22–32)
Calcium: 9.7 mg/dL (ref 8.9–10.3)
Chloride: 94 mmol/L — ABNORMAL LOW (ref 98–111)
Creatinine, Ser: 7.54 mg/dL — ABNORMAL HIGH (ref 0.44–1.00)
GFR, Estimated: 5 mL/min — ABNORMAL LOW (ref >60)
Glucose, Bld: 165 mg/dL — ABNORMAL HIGH (ref 70–99)
Potassium: 3.6 mmol/L (ref 3.5–5.1)
Sodium: 138 mmol/L (ref 135–145)

## 2024-02-01 LAB — CBC
HCT: 32.6 % — ABNORMAL LOW (ref 36.0–46.0)
Hemoglobin: 10.3 g/dL — ABNORMAL LOW (ref 12.0–15.0)
MCH: 31.4 pg (ref 26.0–34.0)
MCHC: 31.6 g/dL (ref 30.0–36.0)
MCV: 99.4 fL (ref 80.0–100.0)
Platelets: 157 10*3/uL (ref 150–400)
RBC: 3.28 MIL/uL — ABNORMAL LOW (ref 3.87–5.11)
RDW: 13.7 % (ref 11.5–15.5)
WBC: 7.1 10*3/uL (ref 4.0–10.5)
nRBC: 0 % (ref 0.0–0.2)

## 2024-02-01 LAB — TROPONIN I (HIGH SENSITIVITY)
Troponin I (High Sensitivity): 9 ng/L (ref <18)
Troponin I (High Sensitivity): 8 ng/L (ref <18)

## 2024-02-01 LAB — RESP PANEL BY RT-PCR (RSV, FLU A&B, COVID)  RVPGX2
Influenza A by PCR: NEGATIVE
Influenza B by PCR: NEGATIVE
Resp Syncytial Virus by PCR: NEGATIVE
SARS Coronavirus 2 by RT PCR: NEGATIVE

## 2024-02-01 LAB — BRAIN NATRIURETIC PEPTIDE: B Natriuretic Peptide: 131.9 pg/mL — ABNORMAL HIGH (ref 0.0–100.0)

## 2024-02-01 LAB — MAGNESIUM: Magnesium: 2.1 mg/dL (ref 1.7–2.4)

## 2024-02-01 MED ORDER — OXYCODONE HCL 5 MG PO TABS
5.0000 mg | ORAL_TABLET | Freq: Once | ORAL | Status: AC
  Administered 2024-02-01: 5 mg via ORAL
  Filled 2024-02-01: qty 1

## 2024-02-01 NOTE — ED Notes (Signed)
 CCMD contacted via cell phone for monitoring

## 2024-02-01 NOTE — Telephone Encounter (Signed)
 FYI Only or Action Required?: FYI only for provider  Patient was last seen in primary care on 01/21/2024 by Marius Siemens, NP. Called Nurse Triage reporting Shortness of Breath and Hypotension. Symptoms began today. Interventions attempted: Rest, hydration, or home remedies. Symptoms are: gradually worsening.  Triage Disposition: Go to ED Now (Notify PCP)  Patient/caregiver understands and will follow disposition?: Yes               Reason for Disposition  [1] MODERATE difficulty breathing (e.g., speaks in phrases, SOB even at rest, pulse 100-120) AND [2] NEW-onset or WORSE than normal  Answer Assessment - Initial Assessment Questions Home health nurse advised this RN she will call 911 for the pt.   1. RESPIRATORY STATUS: Describe your breathing? (e.g., wheezing, shortness of breath, unable to speak, severe coughing)      Just a little shortness of breath 2. ONSET: When did this breathing problem begin?      Last night  3. PATTERN Does the difficult breathing come and go, or has it been constant since it started?      Comes and goes  4. SEVERITY: How bad is your breathing? (e.g., mild, moderate, severe)    - MILD: No SOB at rest, mild SOB with walking, speaks normally in sentences, can lie down, no retractions, pulse < 100.    - MODERATE: SOB at rest, SOB with minimal exertion and prefers to sit, cannot lie down flat, speaks in phrases, mild retractions, audible wheezing, pulse 100-120.    - SEVERE: Very SOB at rest, speaks in single words, struggling to breathe, sitting hunched forward, retractions, pulse > 120      Both SOB on exertion and at rest -- states SOB is worse with Afib 5. RECURRENT SYMPTOM: Have you had difficulty breathing before? If Yes, ask: When was the last time? and What happened that time?      Yes - hx of COPD  6. CARDIAC HISTORY: Do you have any history of heart disease? (e.g., heart attack, angina, bypass surgery, angioplasty)       Afib, HTN, DVT, nonischemic cardiomyopathy 7. LUNG HISTORY: Do you have any history of lung disease?  (e.g., pulmonary embolus, asthma, emphysema)     COPD 8. CAUSE: What do you think is causing the breathing problem?      Afib  9. OTHER SYMPTOMS: Do you have any other symptoms? (e.g., dizziness, runny nose, cough, chest pain, fever)      Experiencing SOB at rest and on exertion; endorses a little weakness, denies dizziness; endorses a little CP to the L chest (it was hurting this AM, states it eased up some)  Afib  BP 97/44 - 25 mins ago (took medications)  10. O2 SATURATION MONITOR:  Do you use an oxygen  saturation monitor (pulse oximeter) at home? If Yes, ask: What is your reading (oxygen  level) today? What is your usual oxygen  saturation reading? (e.g., 95%)        O2 97 HR 54 (then went up to 88) - does not wear oxygen    HR was 92-112 with home health nurse this AM  Protocols used: Breathing Difficulty-A-AH

## 2024-02-01 NOTE — ED Triage Notes (Signed)
 Pt BIB [facility] from home d/t feeling weak & SOB after the last week of receiving all 3 of her full scheduled dialysis Tx. Dialysis schedule: T/Th/Sat. Did feel weak at the beginning of the week but would not come into ED for eval until she woke this AM still feeling weak/SOB with new onset Lt sided chest tightness with radiation of pain into her Lt arm. Describing the pain as a constant dull ache. Was hypotensive earlier in the week & reports yesterday they had a hard time stopping her fistula from bleeding post Tx. EMS did give 4 baby ASA, no nitroglycerin , her BP was 160/50, in A-Fib ranging from 90-120 bpm, CBG 323. Lt arm restricted.

## 2024-02-01 NOTE — Telephone Encounter (Signed)
 Copied from CRM [id number]. Topic: Clinical - Home Health Verbal Orders >> Feb 01, 2024 11:36 AM Star [place] wrote: Caller/Agency: Adell Hones with [facility] Number: [phone] Service Requested: Skilled Nursing Frequency: none Any new concerns about the patient? Yes- patient is still in AFIB and heart rate has been about 92-112 with shortness with of breath and it did go up to 135 yesterday, on medication the BP goes down to 97/44

## 2024-02-01 NOTE — Telephone Encounter (Signed)
 Copied from CRM [id number]. Topic: Clinical - Red Word Triage >> Feb 01, 2024 11:39 AM Star East wrote: Red Word that prompted transfer to Nurse Triage: patient is still in AFIB heart rate 92-112 and with medication bp  97/44

## 2024-02-01 NOTE — Telephone Encounter (Signed)
Attempted to call patient- no answer and unable to leave voicemail.

## 2024-02-01 NOTE — Discharge Instructions (Addendum)
 Your lab work did not show any significant findings.  Please follow-up at the A-fib clinic.  Recommend taking Tylenol  for pain.  Please return to emergency room if you experience any worsening symptoms.

## 2024-02-01 NOTE — ED Notes (Signed)
 Lab contacted via cellphone to add BNP and Mag

## 2024-02-01 NOTE — Telephone Encounter (Signed)
 [name], nurse with [facility] - called to report.AFIB heart rate 92-112 and with medication bp  97/44 & pt having SOB w/exertion.  Home Health Nurse still at patient home.  Nurse then placed pt on the call & pt verfied name and DOB then call disconnected.  Will attempt to call patient back in order to triage.

## 2024-02-01 NOTE — ED Provider Notes (Signed)
 [place] EMERGENCY DEPARTMENT AT [facility] Provider Note   CSN: [id number] Arrival date & time: 02/01/24  1303     Patient presents with: Shortness of Breath, Chest Pain, and Dialysis pt   [name] is a 68 y.o. female history of CHF, ESRD on HD Tu/TH/Sa, COPD, asthma, DM, DVT in pregnancy presents with 1 week of shortness of breath, fatigue and chest pain.  Does endorse cough, no congestion or fevers.  Pain is localized to the left side of her chest radiates down her left arm.  She notes that she is in A-fib.  Reports compliance with her Eliquis .  Ports that she had a cardioversion just last month.    Shortness of Breath Associated symptoms: chest pain   Chest Pain Associated symptoms: shortness of breath    Past Medical History:  Diagnosis Date   Acute on chronic systolic CHF (congestive heart failure) (HCC) 12/21/2020   Allergic rhinitis    Anemia    Arthritis    Asthma    Chronic diastolic CHF (congestive heart failure) (HCC)    Chronic kidney disease    Dialysis Tu/TH/Sa   COPD (chronic obstructive pulmonary disease) (HCC)    COVID-19 03/28/2023   Depression    DM (diabetes mellitus) (HCC)    DVT (deep vein thrombosis) in pregnancy    Gastroenteritis 03/27/2023   GERD (gastroesophageal reflux disease)    HTN (hypertension)    Hyperlipidemia    Obesity    Pneumonia 04/2018   RIGHT LOBE   Sleep apnea    needs to be retested - to get new cpap   Past Surgical History:  Procedure Laterality Date   ABDOMINAL HYSTERECTOMY     AV FISTULA PLACEMENT Left 03/17/2022   Procedure: LEFT ARM ARTERIOVENOUS (AV) FISTULA CREATION;  Surgeon: Dannis Dy, MD;  [facility] OR;  Service: Vascular;  Laterality: Left;   BIOPSY  01/12/2023   Procedure: BIOPSY;  Surgeon: Alvis Jourdain, MD;  Location: [facility] ENDOSCOPY;  Service: Gastroenterology;;   Alford Im STUDY  11/26/2020   Procedure: BUBBLE STUDY;  Surgeon: Sheryle Donning, MD;  Location: [facility]  ENDOSCOPY;  Service: Cardiovascular;;   CARDIOVERSION N/A 05/06/2020   Procedure: CARDIOVERSION;  Surgeon: Sheryle Donning, MD;  Location: [facility] ENDOSCOPY;  Service: Cardiovascular;  Laterality: N/A;   CARDIOVERSION N/A 11/04/2020   Procedure: CARDIOVERSION;  Surgeon: Lenise Quince, MD;  Location: [facility] ENDOSCOPY;  Service: Cardiovascular;  Laterality: N/A;   CARDIOVERSION N/A 02/01/2021   Procedure: CARDIOVERSION;  Surgeon: Mardell Shade, MD;  Location: [facility] ENDOSCOPY;  Service: Cardiovascular;  Laterality: N/A;   CARDIOVERSION N/A 02/09/2021   Procedure: CARDIOVERSION;  Surgeon: Mardell Shade, MD;  Location: [facility] ENDOSCOPY;  Service: Cardiovascular;  Laterality: N/A;   CARDIOVERSION N/A 04/19/2021   Procedure: CARDIOVERSION;  Surgeon: Elmyra Haggard, MD;  Location: [facility] ENDOSCOPY;  Service: Cardiovascular;  Laterality: N/A;   CARDIOVERSION N/A 11/25/2021   Procedure: CARDIOVERSION;  Surgeon: Mardell Shade, MD;  Location: [facility] ENDOSCOPY;  Service: Cardiovascular;  Laterality: N/A;   CARDIOVERSION N/A 01/31/2022   Procedure: CARDIOVERSION;  Surgeon: Mardell Shade, MD;  Location: [facility] ENDOSCOPY;  Service: Cardiovascular;  Laterality: N/A;   CARDIOVERSION N/A 07/23/2023   Procedure: CARDIOVERSION (CATH LAB);  Surgeon: Luana Rumple, MD;  [facility]: [facility] INVASIVE CV LAB;  Service: Cardiovascular;  Laterality: N/A;   CARDIOVERSION N/A 12/05/2023   Procedure: CARDIOVERSION;  Surgeon: Mardell Shade, MD;  Location: [facility] INVASIVE CV LAB;  Service: Cardiovascular;  Laterality: N/A;  CATARACT EXTRACTION, BILATERAL     CHOLECYSTECTOMY     COLONOSCOPY WITH PROPOFOL  N/A 03/05/2015   Procedure: COLONOSCOPY WITH PROPOFOL ;  Surgeon: Alvis Jourdain, MD;  Location: [facility] ENDOSCOPY;  Service: Endoscopy;  Laterality: N/A;   DIAGNOSTIC LAPAROSCOPY     ESOPHAGOGASTRODUODENOSCOPY (EGD) WITH PROPOFOL  N/A 01/12/2023   Procedure: ESOPHAGOGASTRODUODENOSCOPY (EGD) WITH PROPOFOL ;  Surgeon: Alvis Jourdain, MD;   Location: [facility] ENDOSCOPY;  Service: Gastroenterology;  Laterality: N/A;   FISTULA SUPERFICIALIZATION Left 03/17/2022   Procedure: FISTULA SUPERFICIALIZATION -LEFT;  Surgeon: Dannis Dy, MD;  Location: [facility] OR;  Service: Vascular;  Laterality: Left;   ingrown hallux Left    IR FLUORO GUIDE CV LINE RIGHT  03/14/2022   IR US  GUIDE VASC ACCESS RIGHT  03/14/2022   KNEE SURGERY     LEFT HEART CATH AND CORONARY ANGIOGRAPHY N/A 12/31/2017   Procedure: LEFT HEART CATH AND CORONARY ANGIOGRAPHY;  Surgeon: Chapman Commodore, MD;  Location: [facility] INVASIVE CV LAB;  Service: Cardiovascular;  Laterality: N/A;   MAZE N/A 12/29/2020   Procedure: MAZE;  Surgeon: Zelphia Higashi, MD;  Location: [facility] OR;  Service: Open Heart Surgery;  Laterality: N/A;   MITRAL VALVE REPAIR N/A 12/29/2020   Procedure: MITRAL VALVE REPAIR USING CARBOMEDICS ANNULOFLEX RING SIZE 30;  Surgeon: Zelphia Higashi, MD;  Location: [facility] OR;  Service: Open Heart Surgery;  Laterality: N/A;   REVISON OF ARTERIOVENOUS FISTULA Left 01/05/2023   Procedure: REVISION OF LEFT ARTERIOVENOUS FISTULA WITH INTERPOSITION PTFE GRAFT;  Surgeon: Dannis Dy, MD;  Location: [facility] OR;  Service: Vascular;  Laterality: Left;  ELIQUIS  AND HOLD 48 X HOURS   RIGHT HEART CATH N/A 12/22/2020   Procedure: RIGHT HEART CATH;  Surgeon: Darlis Eisenmenger, MD;  Location: [facility] INVASIVE CV LAB;  Service: Cardiovascular;  Laterality: N/A;   RIGHT HEART CATH N/A 01/31/2021   Procedure: RIGHT HEART CATH;  Surgeon: Mardell Shade, MD;  Location: [facility] INVASIVE CV LAB;  Service: Cardiovascular;  Laterality: N/A;   RIGHT HEART CATH N/A 07/29/2021   Procedure: RIGHT HEART CATH;  Surgeon: Mardell Shade, MD;  Location: [facility] INVASIVE CV LAB;  Service: Cardiovascular;  Laterality: N/A;   RIGHT HEART CATH N/A 11/24/2021   Procedure: RIGHT HEART CATH;  Surgeon: Mardell Shade, MD;  [facility]: [facility] INVASIVE CV LAB;  Service: Cardiovascular;  Laterality: N/A;   RIGHT HEART CATH  N/A 02/14/2022   Procedure: RIGHT HEART CATH;  Surgeon: Mardell Shade, MD;  [facility]: [facility] INVASIVE CV LAB;  Service: Cardiovascular;  Laterality: N/A;   RIGHT HEART CATH N/A 02/22/2022   Procedure: RIGHT HEART CATH;  Surgeon: Darlis Eisenmenger, MD;  Location: [facility] INVASIVE CV LAB;  Service: Cardiovascular;  Laterality: N/A;   TEE WITHOUT CARDIOVERSION N/A 11/26/2020   Procedure: TRANSESOPHAGEAL ECHOCARDIOGRAM (TEE);  Surgeon: Sheryle Donning, MD;  Location: [facility] ENDOSCOPY;  Service: Cardiovascular;  Laterality: N/A;   TEE WITHOUT CARDIOVERSION N/A 12/29/2020   Procedure: TRANSESOPHAGEAL ECHOCARDIOGRAM (TEE);  Surgeon: Zelphia Higashi, MD;  Location: [facility] OR;  Service: Open Heart Surgery;  Laterality: N/A;   TEE WITHOUT CARDIOVERSION N/A 01/20/2022   Procedure: TRANSESOPHAGEAL ECHOCARDIOGRAM (TEE);  Surgeon: Mardell Shade, MD;  Location: [facility] ENDOSCOPY;  Service: Cardiovascular;  Laterality: N/A;   TRICUSPID VALVE REPLACEMENT N/A 12/29/2020   Procedure: 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 RING SIZE 34;  Surgeon: Zelphia Higashi, MD;  Location: [facility] OR;  Service: Open Heart Surgery;  Laterality: N/A;       Prior to Admission  medications   Medication Sig Start Date End Date Taking? Authorizing Provider  Accu-Chek Softclix Lancets lancets Use to check blood sugar 3 times daily. 01/01/24   Newlin, Enobong, MD  acetaminophen  (TYLENOL ) 325 MG tablet Take 2 tablets (650 mg total) by mouth every 6 (six) hours as needed for mild pain or moderate pain. 12/07/21   Arrien, Mauricio Daniel, MD  albuterol  (PROVENTIL  HFA) 108 [phone] Base) MCG/ACT inhaler Inhale 1-2 puffs into the lungs every 6 (six) hours as needed for wheezing or shortness of breath. 10/13/22   Marius Siemens, NP  amiodarone  (PACERONE ) 200 MG tablet Take 1 tablet (200 mg total) by mouth daily. Patient taking differently: Take 200 mg by mouth 2 (two) times daily. 11/23/23   Bensimhon, Rheta Celestine, MD  atorvastatin  (LIPITOR)  40 MG tablet Take 1 tablet (40 mg total) by mouth daily. 01/25/24 02/24/24  Walker, Caitlin S, NP  Blood Glucose Monitoring Suppl (ACCU-CHEK GUIDE) w/Device KIT Use to check blood sugar 3 times daily. 01/01/24   Newlin, Enobong, MD  busPIRone  (BUSPAR ) 5 MG tablet Take 1 tablet (5 mg total) by mouth 2 (two) times daily. 11/28/23   Marius Siemens, NP  diphenhydrAMINE  (BENADRYL ) 25 MG tablet Take 25 mg by mouth every 6 (six) hours as needed for itching. 03/31/22   [provider]  DULoxetine  (CYMBALTA ) 60 MG capsule Take 1 capsule (60 mg total) by mouth daily. 01/17/24   Marius Siemens, NP  ELIQUIS  5 MG TABS tablet TAKE ONE TABLET (5MG  TOTAL) BY MOUTH TWICE DAILY @9AM -5PM 09/06/23   Bensimhon, Rheta Celestine, MD  EPINEPHrine  0.3 mg/0.3 mL IJ SOAJ injection Inject 0.3 mg into the muscle as needed for anaphylaxis. 06/25/20   Marius Siemens, NP  fluticasone  (FLONASE  ALLERGY RELIEF) 50 MCG/ACT nasal spray Place 2 sprays into both nostrils daily as needed for allergies. 03/31/22   [provider]  glucose blood (ACCU-CHEK GUIDE TEST) test strip Use to check blood sugar 3 times daily. 01/01/24   Newlin, Enobong, MD  insulin  glargine (LANTUS  SOLOSTAR) 100 UNIT/ML Solostar Pen Inject 10 Units into the skin daily. 09/27/23   Marius Siemens, NP  insulin  lispro (HUMALOG  KWIKPEN) 100 UNIT/ML KwikPen For blood sugars 0-150 give 0 units of insulin , 151-200 give 2 units of insulin , 201-250 give 4 units, 251-300 give 6 units, 301-350 give 8 units, 351-400 give 10 units,> 400 give 12 units and call M.D. 09/27/23   Newlin, Enobong, MD  lidocaine -prilocaine  (EMLA ) cream Apply 1 Application topically Every Tuesday,Thursday,and Saturday with dialysis. 06/01/22   [provider]  loratadine  (CLARITIN ) 10 MG tablet Take 10 mg by mouth every morning. 12/05/23   [provider]  metoCLOPramide  (REGLAN ) 5 MG tablet Take 1 tablet (5 mg total) by mouth every 8 (eight) hours as needed for nausea.  07/10/23   Marius Siemens, NP  metoprolol  tartrate (LOPRESSOR ) 25 MG tablet Take 1 tablet (25 mg total) by mouth 2 (two) times daily. 01/25/24   Clearnce Curia, NP  midodrine  (PROAMATINE ) 10 MG tablet Take 1 tablet (10 mg total) by mouth See admin instructions. Take 10mg  (1 tablet) by mouth on Tuesday's, Thursday's, and Saturday's. 03/30/23   Daren Eck, DO  Multiple Vitamins-Minerals (MULTIVITAMIN WOMEN PO) Take 1 tablet by mouth daily.    [provider]  nitroGLYCERIN  (NITROSTAT ) 0.4 MG SL tablet Place 1 tablet (0.4 mg total) under the tongue every 5 (five) minutes x 3 doses as needed for chest pain. 11/08/23   Sheryle Donning,  MD  pantoprazole  (PROTONIX ) 40 MG tablet Take 1 tablet (40 mg total) by mouth daily. 04/24/23   Marius Siemens, NP  polyethylene glycol powder (MIRALAX ) 17 GM/SCOOP powder Take 17 g by mouth daily as needed (constipation.). 06/26/22   [provider]    Allergies: Bee pollen, Bee venom, Sulfa antibiotics, Chlorhexidine , and Iodinated contrast media    Review of Systems  Respiratory:  Positive for shortness of breath.   Cardiovascular:  Positive for chest pain.    Updated Vital Signs BP (!) 145/98 (BP Location: Right Arm)   Pulse (!) 108   Temp 98.7 F (37.1 C) (Oral)   Resp (!) 21   Ht 5' 7 (1.702 m)   Wt 125.2 kg   SpO2 99%   BMI 43.23 kg/m   Physical Exam Vitals and nursing note reviewed.  Constitutional:      General: She is not in acute distress.    Appearance: She is well-developed.  HENT:     Head: Normocephalic and atraumatic.   Eyes:     Conjunctiva/sclera: Conjunctivae normal.    Cardiovascular:     Rate and Rhythm: Normal rate and regular rhythm.     Heart sounds: No murmur heard. Pulmonary:     Effort: Pulmonary effort is normal. No respiratory distress.     Comments: Left lower lobe Rales, no wheezing Chest:     Chest wall: Tenderness present.  Abdominal:     Palpations: Abdomen is soft.      Tenderness: There is no abdominal tenderness.   Musculoskeletal:        General: No swelling.     Cervical back: Neck supple.   Skin:    General: Skin is warm and dry.     Capillary Refill: Capillary refill takes less than 2 seconds.   Neurological:     Mental Status: She is alert.   Psychiatric:        Mood and Affect: Mood normal.     (all labs ordered are listed, but only abnormal results are displayed) Labs Reviewed  BASIC METABOLIC PANEL WITH GFR - Abnormal; Notable for the following components:      Result Value   Chloride 94 (*)    Glucose, Bld 165 (*)    BUN 29 (*)    Creatinine, Ser 7.54 (*)    GFR, Estimated 5 (*)    Anion gap 18 (*)    All other components within normal limits  CBC - Abnormal; Notable for the following components:   RBC 3.28 (*)    Hemoglobin 10.3 (*)    HCT 32.6 (*)    All other components within normal limits  RESP PANEL BY RT-PCR (RSV, FLU A&B, COVID)  RVPGX2  BRAIN NATRIURETIC PEPTIDE  MAGNESIUM   URINALYSIS, ROUTINE W REFLEX MICROSCOPIC  TROPONIN I (HIGH SENSITIVITY)  TROPONIN I (HIGH SENSITIVITY)    EKG: None  Radiology: [facility] Chest Port 1 View Result Date: 02/01/2024 CLINICAL DATA:  Shortness of breath. EXAM: PORTABLE CHEST 1 VIEW COMPARISON:  12/24/2023. FINDINGS: Low lung volumes. Stable cardiomegaly. Prior median sternotomy, valve replacement and left atrial appendage closure device. Minimal left basilar atelectasis. No focal consolidation, sizeable pleural effusion, or pneumothorax. No acute osseous abnormality. IMPRESSION: Low lung volumes with stable cardiomegaly and minimal left basilar atelectasis. Otherwise, no acute cardiopulmonary findings. Electronically Signed   By: Mannie Seek M.D.   On: 02/01/2024 14:37     Procedures   Medications Ordered in the ED  oxyCODONE  (Oxy IR/ROXICODONE ) immediate  release tablet 5 mg (5 mg Oral Given 02/01/24 1437)    Clinical Course as of 02/01/24 1548  Fri Feb 01, 2024  1439 DG  Chest [place] 1 View [JT]    Clinical Course User Index [JT] Felicie Horning, [place]                                 Medical Decision Making Amount and/or Complexity of Data Reviewed Labs: ordered. Radiology: ordered.   This patient presents to the ED with chief complaint(s) of CP .  The complaint involves an extensive differential diagnosis and also carries with it a high risk of complications and morbidity.   Pertinent past medical history as listed in HPI  The differential diagnosis includes  A-fib, ACS, PE, pneumonia, URI Additional history obtained: Records reviewed Care Everywhere/External Records  Assessment and management:   Hemodynamically stable, nontoxic-appearing patient presenting with 1 week of shortness of breath, chest pain and fatigue.  Was recently seen by outpatient cardiology, found to be in A-fib.  She has been compliant with her metoprolol  and Eliquis .  Plan was for outpatient A-fib clinic follow-up.  Appears that she has an appointment on suspects 18.  Patient was admitted last month 5-8/25 with chest pain.  Coronary CTA at that time with mild nonobstructive CAD calcium  score 58.9.  Felt to be consistent with costochondritis.  She was seen again by her cardiologist few days ago 6/6 EKG at that time demonstrated recurrent atrial flutter.  Plan was to increase metoprolol  to tartrate from 12.5 Mg to 25 Mg twice daily, continue amiodarone  200 Mg twice daily, Eliquis  5 Mg twice daily.   Workup overall reassuring.  Appears that she has been in the A-fib for weeks now and has a follow-up with A-fib clinic upcoming week.  She will for discharge with close follow-up.  Independent ECG interpretation:  A-fib  Independent labs interpretation:  The following labs were independently interpreted:  CBC without significant findings, BMP with creatinine of 7.5, GFR 5, anion gap of 18, resp panel negative  Independent visualization and interpretation of imaging: I independently  visualized the following imaging with scope of interpretation limited to determining acute life threatening conditions related to emergency care:  CXR with stable cardiomegaly, minimal left lower lobe atelectasis   Consultations obtained:   Cardiology Trish, discussed plan for discharge, is in agreement with outpatient f/u.   Disposition:   Patient will be discharged home. The patient has been appropriately medically screened and/or stabilized in the ED. I have low suspicion for any other emergent medical condition which would require further screening, evaluation or treatment in the ED or require inpatient management. At time of discharge the patient is hemodynamically stable and in no acute distress. I have discussed work-up results and diagnosis with patient and answered all questions. Patient is agreeable with discharge plan. We discussed strict return precautions for returning to the emergency department and they verbalized understanding.     Social Determinants of Health:   None This note was dictated with voice recognition software.  Despite best efforts at proofreading, errors may have occurred which can change the documentation meaning.       Final diagnoses:  Atypical chest pain  Paroxysmal atrial fibrillation [facility])  Shortness of breath    ED Discharge Orders     None          Stanton Earthly 02/01/24 1548    Tee,  Liane Redman, MD 02/01/24 [phone]

## 2024-02-01 NOTE — Telephone Encounter (Signed)
 noted

## 2024-02-01 NOTE — Telephone Encounter (Signed)
 Will forward to provider

## 2024-02-02 DIAGNOSIS — I953 Hypotension of hemodialysis: Secondary | ICD-10-CM | POA: Diagnosis not present

## 2024-02-02 DIAGNOSIS — D631 Anemia in chronic kidney disease: Secondary | ICD-10-CM | POA: Diagnosis not present

## 2024-02-02 DIAGNOSIS — N186 End stage renal disease: Secondary | ICD-10-CM | POA: Diagnosis not present

## 2024-02-02 DIAGNOSIS — N2581 Secondary hyperparathyroidism of renal origin: Secondary | ICD-10-CM | POA: Diagnosis not present

## 2024-02-02 DIAGNOSIS — Z992 Dependence on renal dialysis: Secondary | ICD-10-CM | POA: Diagnosis not present

## 2024-02-02 DIAGNOSIS — D508 Other iron deficiency anemias: Secondary | ICD-10-CM | POA: Diagnosis not present

## 2024-02-02 DIAGNOSIS — E1129 Type 2 diabetes mellitus with other diabetic kidney complication: Secondary | ICD-10-CM | POA: Diagnosis not present

## 2024-02-04 NOTE — Telephone Encounter (Signed)
 Unfortunately her atrial fibrillation is very difficult to control with limited options for antiarrhythmic therapy and prior bradycardia. After this phone call, 02/01/24 ED visit with 1 week history of SOB, fatigue, chest pain. She was recommended to follow up with AFib clinic as scheduled 02/06/24. Would recommend same.   CCing AFib clinic provider as FYI only.  Elnathan Fulford S Avram Danielson, NP

## 2024-02-04 NOTE — Telephone Encounter (Signed)
 Good Morning [name] I received this message from home health in regards to pt bp. Would you have an recommendations for the patient. I have also attached pcp to encounter as well.

## 2024-02-05 ENCOUNTER — Other Ambulatory Visit (INDEPENDENT_AMBULATORY_CARE_PROVIDER_SITE_OTHER): Payer: Self-pay | Admitting: Primary Care

## 2024-02-05 DIAGNOSIS — E1129 Type 2 diabetes mellitus with other diabetic kidney complication: Secondary | ICD-10-CM | POA: Diagnosis not present

## 2024-02-05 DIAGNOSIS — N186 End stage renal disease: Secondary | ICD-10-CM | POA: Diagnosis not present

## 2024-02-05 DIAGNOSIS — Z992 Dependence on renal dialysis: Secondary | ICD-10-CM | POA: Diagnosis not present

## 2024-02-05 DIAGNOSIS — D508 Other iron deficiency anemias: Secondary | ICD-10-CM | POA: Diagnosis not present

## 2024-02-05 DIAGNOSIS — N2581 Secondary hyperparathyroidism of renal origin: Secondary | ICD-10-CM | POA: Diagnosis not present

## 2024-02-05 DIAGNOSIS — I953 Hypotension of hemodialysis: Secondary | ICD-10-CM | POA: Diagnosis not present

## 2024-02-05 DIAGNOSIS — D631 Anemia in chronic kidney disease: Secondary | ICD-10-CM | POA: Diagnosis not present

## 2024-02-05 NOTE — Telephone Encounter (Unsigned)
 Copied from CRM [id number]. Topic: Clinical - Medication Refill >> Feb 05, 2024  5:45 PM DeAngela L wrote: Medication: loratadine  (CLARITIN ) 10 MG tablet  Has the patient contacted their pharmacy? Yes  (Agent: If no, request that the patient contact the pharmacy for the refill. If patient does not wish to contact the pharmacy document the reason why and proceed with request.) (Agent: If yes, when and what did the pharmacy advise?)  This is the patient's preferred pharmacy:  [facility] [place] [address] [place] [address] Phone: [phone] Fax: [phone]  Is this the correct pharmacy for this prescription? Yes  If no, delete pharmacy and type the correct one.   Has the prescription been filled recently? Yes   Is the patient out of the medication? Yes   Has the patient been seen for an appointment in the last year OR does the patient have an upcoming appointment? Yes  Can we respond through MyChart? No  Agent: Please be advised that Rx refills may take up to 3 business days. We ask that you follow-up with your pharmacy.

## 2024-02-05 NOTE — Telephone Encounter (Signed)
 Noted

## 2024-02-06 ENCOUNTER — Ambulatory Visit (HOSPITAL_COMMUNITY): Admitting: Internal Medicine

## 2024-02-07 DIAGNOSIS — D508 Other iron deficiency anemias: Secondary | ICD-10-CM | POA: Diagnosis not present

## 2024-02-07 DIAGNOSIS — Z992 Dependence on renal dialysis: Secondary | ICD-10-CM | POA: Diagnosis not present

## 2024-02-07 DIAGNOSIS — E1129 Type 2 diabetes mellitus with other diabetic kidney complication: Secondary | ICD-10-CM | POA: Diagnosis not present

## 2024-02-07 DIAGNOSIS — D631 Anemia in chronic kidney disease: Secondary | ICD-10-CM | POA: Diagnosis not present

## 2024-02-07 DIAGNOSIS — N2581 Secondary hyperparathyroidism of renal origin: Secondary | ICD-10-CM | POA: Diagnosis not present

## 2024-02-07 DIAGNOSIS — I953 Hypotension of hemodialysis: Secondary | ICD-10-CM | POA: Diagnosis not present

## 2024-02-07 DIAGNOSIS — N186 End stage renal disease: Secondary | ICD-10-CM | POA: Diagnosis not present

## 2024-02-07 NOTE — Telephone Encounter (Signed)
 Requested medication (s) are due for refill today: yes  Requested medication (s) are on the active medication list: yes  Last refill:  12/25/23  Future visit scheduled: yes  Notes to clinic:  Unable to refill per protocol, last refill by another provider.      Requested Prescriptions  Pending Prescriptions Disp Refills   loratadine  (CLARITIN ) 10 MG tablet      Sig: Take 1 tablet (10 mg total) by mouth every morning.     Ear, Nose, and Throat:  Antihistamines 2 Failed - 02/07/2024  4:05 PM      Failed - Cr in normal range and within 360 days    Creatinine, Ser  Date Value Ref Range Status  02/01/2024 7.54 (H) 0.44 - 1.00 mg/dL Final   Creatinine, Urine  Date Value Ref Range Status  07/29/2021 97.28 mg/dL Final    Comment:    Performed at [facility], [address], [place], [place] [address]         Passed - Valid encounter within last 12 months    Recent Outpatient Visits           2 months ago Generalized anxiety disorder   [place] [facility] Marius Siemens, NP   2 months ago Cough productive of purulent sputum   [place] [facility] Marius Siemens, NP   1 year ago Generalized anxiety disorder   [place] [facility] Marius Siemens, NP   1 year ago Diabetes mellitus type 2 in obese [facility])   [place] [facility] Marius Siemens, NP   1 year ago Diabetes mellitus type 2 in obese [facility])   [facility] [place] Joaquin Mulberry, MD       Future Appointments             In 3 months Sheryle Donning, MD [facility] at [facility]

## 2024-02-08 ENCOUNTER — Other Ambulatory Visit (INDEPENDENT_AMBULATORY_CARE_PROVIDER_SITE_OTHER): Payer: Self-pay | Admitting: Primary Care

## 2024-02-08 DIAGNOSIS — F4323 Adjustment disorder with mixed anxiety and depressed mood: Secondary | ICD-10-CM

## 2024-02-08 MED ORDER — LORATADINE 10 MG PO TABS: 10.0000 mg | ORAL_TABLET | Freq: Every morning | ORAL | 0 refills | Status: DC

## 2024-02-08 NOTE — Telephone Encounter (Signed)
 Will forward to provider

## 2024-02-09 DIAGNOSIS — N2581 Secondary hyperparathyroidism of renal origin: Secondary | ICD-10-CM | POA: Diagnosis not present

## 2024-02-09 DIAGNOSIS — Z992 Dependence on renal dialysis: Secondary | ICD-10-CM | POA: Diagnosis not present

## 2024-02-09 DIAGNOSIS — I953 Hypotension of hemodialysis: Secondary | ICD-10-CM | POA: Diagnosis not present

## 2024-02-09 DIAGNOSIS — D631 Anemia in chronic kidney disease: Secondary | ICD-10-CM | POA: Diagnosis not present

## 2024-02-09 DIAGNOSIS — E1129 Type 2 diabetes mellitus with other diabetic kidney complication: Secondary | ICD-10-CM | POA: Diagnosis not present

## 2024-02-09 DIAGNOSIS — D508 Other iron deficiency anemias: Secondary | ICD-10-CM | POA: Diagnosis not present

## 2024-02-09 DIAGNOSIS — N186 End stage renal disease: Secondary | ICD-10-CM | POA: Diagnosis not present

## 2024-02-12 DIAGNOSIS — D508 Other iron deficiency anemias: Secondary | ICD-10-CM | POA: Diagnosis not present

## 2024-02-12 DIAGNOSIS — E1129 Type 2 diabetes mellitus with other diabetic kidney complication: Secondary | ICD-10-CM | POA: Diagnosis not present

## 2024-02-12 DIAGNOSIS — I953 Hypotension of hemodialysis: Secondary | ICD-10-CM | POA: Diagnosis not present

## 2024-02-12 DIAGNOSIS — D631 Anemia in chronic kidney disease: Secondary | ICD-10-CM | POA: Diagnosis not present

## 2024-02-12 DIAGNOSIS — Z992 Dependence on renal dialysis: Secondary | ICD-10-CM | POA: Diagnosis not present

## 2024-02-12 DIAGNOSIS — N186 End stage renal disease: Secondary | ICD-10-CM | POA: Diagnosis not present

## 2024-02-12 DIAGNOSIS — N2581 Secondary hyperparathyroidism of renal origin: Secondary | ICD-10-CM | POA: Diagnosis not present

## 2024-02-13 ENCOUNTER — Encounter (HOSPITAL_COMMUNITY): Payer: Self-pay

## 2024-02-13 ENCOUNTER — Other Ambulatory Visit (INDEPENDENT_AMBULATORY_CARE_PROVIDER_SITE_OTHER): Payer: Self-pay | Admitting: Primary Care

## 2024-02-13 ENCOUNTER — Ambulatory Visit (HOSPITAL_COMMUNITY): Admission: RE | Admit: 2024-02-13 | Source: Ambulatory Visit | Admitting: Internal Medicine

## 2024-02-13 DIAGNOSIS — F32A Depression, unspecified: Secondary | ICD-10-CM

## 2024-02-13 DIAGNOSIS — E1142 Type 2 diabetes mellitus with diabetic polyneuropathy: Secondary | ICD-10-CM

## 2024-02-13 DIAGNOSIS — Z760 Encounter for issue of repeat prescription: Secondary | ICD-10-CM

## 2024-02-13 NOTE — Progress Notes (Incomplete)
 Primary Care Physician: Celestia Rosaline SQUIBB, NP Referring Physician: Dr Lonni Elveria JONETTA [name] is a 68 y.o. female with , type 2 DM, morbid obesity, CKD, a a hx of  many  years of longstanding persisitent afib ( ekg's reviewed from all the back to 2011 show persistent  afib) , previoulsy  followed by Dr. Levern until recently, established with  Dr. Lonni, 03/31/20.  She set her up for a cardioversion, she did convert to SR, but she had ERAF possibly after just a few hours. 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 She  did at one time weigh around 500-600 lbs. Her left atrium size is 5.3 indicating severe left atrial enlargement.She  is bothered with fatigue and shortness of breath. She is on pradaxa  150 mg bid. Dr. Lonni wanted me to discuss tikosyn  admit with the pt.  She was admitted for Tikosyn  11/02/20. Unfortunately, Tikosyn  did not work to keep her in SR. She was started on amiodarone  200 mg bid and d/c home. She then went on to have  an hospitalization in May 2022,  with acute on chronic HF, wight up 30 lbs and was diuresed. She then went on to  Mitral and Tricuspid Valvular ring annuloplasty, Left and Right sided MAZE procedure, and clipping of her Left Atrial Appendage with Dr. Kerrin.  She tolerated the procedure without difficulty and was taken to the SICU in stable condition.  she was in NSR after surgery. SHe was followed by HF team. Her creatinine peaked at 4.36. She was supported on IV milrinone   for several days after surgery.   She then was hospitalized again 6/11 thru 6/15 with acute exacerbation of CHF. Again diuresed and had cardioversion. She was in SR on discharge.   Admitted again 02/07/21 thru 02/10/21 with substernal chest pain, nausea/vomiting and abdominal pain. She was back in afib with RVR. She again had repeat cardioversion.   She again had admission  02/25/21 with afib with RVR. She was out of amiodarone   x one week. She was started on IV amiodarone  and converted. SHe was discharged on 200 mg bid of amiodarone .  EP consulted and wanted to defer treatment for 6 months until repeat echo. At that time there was discussion of PPM with AV nodal ablation.  She  was then seen acutely  by Jodie Passey, PA, for Dr. Kelsie,  8/25 for being in persistent afib. She was not felt to be a current candidate for PPM with AV nodal ablation as she had just had Maze procedure in May. She was set up for cardioversion 04/19/21.   F/u in Afib clinic, 04/26/21, for successful cardioversion 04/19/21.  She was back in afib with v rate at 106 bpm. Discussion with HF and torsemide  was increased.   She saw Dr. Kelsie on 05/30/21 and was in SR. Plan was to continue amiodarone  and eliquis .   Admitted 4/23 with recurrent HR in the setting of recurrent AF. Underwent DCCV and converted to NSR on 11/25/21.   Seen by Dr. Cindie on 01/17/22 and determined to not be a candidate for catheter ablation and not a good candidate for AV junction ablation and PPM. Continued on amiodarone . Readmitted 5/30-6/5 for acute on chronic CHF.  Underwent DCCV on 01/31/22 for atrial flutter and successfully converted to NSR.   Admitted on 03/08/22 due to acute exacerbation of CHF and due to worsening renal function and progression to ESRD underwent  placement of right IJ catheter for HD on 03/14/22. S/p placement of left brachiocephalic fistula on 03/17/22.   HD schedule T-Th-Sat.  Recent ED visits on 09/03/22 and 09/14/22 for chest pain, nausea, and vomiting. ED EKG on 09/04/22 showed 2:1 atrial flutter.   On follow up today, her primary concern today is nausea, stomach ache, and vomiting. She currently denies chest pain or shortness of breath. She feels like her n/v and stomach issues have been going on for a while now. She voices frustration in ability to perform daily activities secondary to current symptoms. She states prescription given for Velphoro to take  TID; she was advised this medication would help breakdown her food more easily. She states it has not helped. She does endorse previously trying a medication that melted in her mouth helped somewhat for her nausea.   She has been compliant with her amiodarone  and eliquis . Denies any bleeding concerns.   On follow up 07/16/23, she is currently in Afib. Recent hospital admission 11/12-15 for epigastric pain, nausea, and vomiting likely secondary to chronic gastroparesis. She was noncompliant with 2 missed dialysis sessions prior to hospital admission. Daughter contacted office on 11/21 noting patient was at dialysis and her monitor flatlined. She currently takes amiodarone  200 mg daily and metoprolol  12.5 mg BID. She has a history of noted QT prolongation. Previously, Dr. Cindie has noted patient is not a good candidate for ablation or AV nodal ablation/PPM. No missed doses of Eliquis  5 mg BID.   On follow up 02/13/24, she is currently in ***. Seen by Cardiology on 01/25/24 and noted to be in atrial flutter at that time. Lopressor  increased to 25 mg BID and to remain on amiodarone  200 mg BID. Reviewed case with Dr. Cindie to consider repeat DCCV and follow up with him to discuss AV nodal ablation. No missed doses of Eliquis  5 mg BID.   Today, she denies symptoms of palpitations, chest pain, shortness of breath, orthopnea, PND, lower extremity edema, dizziness, presyncope, syncope, or neurologic sequela. The patient is tolerating medications without difficulties.  Past Medical History:  Diagnosis Date   Acute on chronic systolic CHF (congestive heart failure) (HCC) 12/21/2020   Allergic rhinitis    Anemia    Arthritis    Asthma    Chronic diastolic CHF (congestive heart failure) (HCC)    Chronic kidney disease    Dialysis Tu/TH/Sa   COPD (chronic obstructive pulmonary disease) (HCC)    COVID-19 03/28/2023   Depression    DM (diabetes mellitus) (HCC)    DVT (deep vein thrombosis) in pregnancy     Gastroenteritis 03/27/2023   GERD (gastroesophageal reflux disease)    HTN (hypertension)    Hyperlipidemia    Obesity    Pneumonia 04/2018   RIGHT LOBE   Sleep apnea    needs to be retested - to get new cpap   Past Surgical History:  Procedure Laterality Date   ABDOMINAL HYSTERECTOMY     AV FISTULA PLACEMENT Left 03/17/2022   Procedure: LEFT ARM ARTERIOVENOUS (AV) FISTULA CREATION;  Surgeon: Eliza Lonni RAMAN, MD;  Location: [facility] OR;  Service: Vascular;  Laterality: Left;   BIOPSY  01/12/2023   Procedure: BIOPSY;  Surgeon: Rollin Dover, MD;  Location: [facility];  Service: Gastroenterology;;   VASSIE STUDY  11/26/2020   Procedure: BUBBLE STUDY;  Surgeon: Lonni Slain, MD;  Location: [facility] ENDOSCOPY;  Service: Cardiovascular;;   CARDIOVERSION N/A 05/06/2020   Procedure: CARDIOVERSION;  Surgeon: Lonni Slain, MD;  Location: [facility] ENDOSCOPY;  Service: Cardiovascular;  Laterality: N/A;   CARDIOVERSION N/A 11/04/2020   Procedure: CARDIOVERSION;  Surgeon: Pietro Redell RAMAN, MD;  Location: [facility] ENDOSCOPY;  Service: Cardiovascular;  Laterality: N/A;   CARDIOVERSION N/A 02/01/2021   Procedure: CARDIOVERSION;  Surgeon: Cherrie Toribio SAUNDERS, MD;  Location: [facility] ENDOSCOPY;  Service: Cardiovascular;  Laterality: N/A;   CARDIOVERSION N/A 02/09/2021   Procedure: CARDIOVERSION;  Surgeon: Cherrie Toribio SAUNDERS, MD;  Location: [facility] ENDOSCOPY;  Service: Cardiovascular;  Laterality: N/A;   CARDIOVERSION N/A 04/19/2021   Procedure: CARDIOVERSION;  Surgeon: Okey Vina GAILS, MD;  Location: [facility] ENDOSCOPY;  Service: Cardiovascular;  Laterality: N/A;   CARDIOVERSION N/A 11/25/2021   Procedure: CARDIOVERSION;  Surgeon: Cherrie Toribio SAUNDERS, MD;  Location: [facility] ENDOSCOPY;  Service: Cardiovascular;  Laterality: N/A;   CARDIOVERSION N/A 01/31/2022   Procedure: CARDIOVERSION;  Surgeon: Cherrie Toribio SAUNDERS, MD;  Location: [facility] ENDOSCOPY;  Service: Cardiovascular;  Laterality: N/A;   CARDIOVERSION N/A 07/23/2023    Procedure: CARDIOVERSION (CATH LAB);  Surgeon: Francyne Headland, MD;  Location: [facility] INVASIVE CV LAB;  Service: Cardiovascular;  Laterality: N/A;   CARDIOVERSION N/A 12/05/2023   Procedure: CARDIOVERSION;  Surgeon: Cherrie Toribio SAUNDERS, MD;  Location: [facility] INVASIVE CV LAB;  Service: Cardiovascular;  Laterality: N/A;   CATARACT EXTRACTION, BILATERAL     CHOLECYSTECTOMY     COLONOSCOPY WITH PROPOFOL  N/A 03/05/2015   Procedure: COLONOSCOPY WITH PROPOFOL ;  Surgeon: Belvie Just, MD;  Location: [facility] ENDOSCOPY;  Service: Endoscopy;  Laterality: N/A;   DIAGNOSTIC LAPAROSCOPY     ESOPHAGOGASTRODUODENOSCOPY (EGD) WITH PROPOFOL  N/A 01/12/2023   Procedure: ESOPHAGOGASTRODUODENOSCOPY (EGD) WITH PROPOFOL ;  Surgeon: Just Belvie, MD;  Location: [facility] ENDOSCOPY;  Service: Gastroenterology;  Laterality: N/A;   FISTULA SUPERFICIALIZATION Left 03/17/2022   Procedure: FISTULA SUPERFICIALIZATION -LEFT;  Surgeon: Eliza Lonni RAMAN, MD;  Location: [facility] OR;  Service: Vascular;  Laterality: Left;   ingrown hallux Left    IR FLUORO GUIDE CV LINE RIGHT  03/14/2022   IR US  GUIDE VASC ACCESS RIGHT  03/14/2022   KNEE SURGERY     LEFT HEART CATH AND CORONARY ANGIOGRAPHY N/A 12/31/2017   Procedure: LEFT HEART CATH AND CORONARY ANGIOGRAPHY;  Surgeon: Levern Hutching, MD;  Location: [facility] INVASIVE CV LAB;  Service: Cardiovascular;  Laterality: N/A;   MAZE N/A 12/29/2020   Procedure: MAZE;  Surgeon: Kerrin Elspeth BROCKS, MD;  Location: [facility] OR;  Service: Open Heart Surgery;  Laterality: N/A;   MITRAL VALVE REPAIR N/A 12/29/2020   Procedure: MITRAL VALVE REPAIR USING CARBOMEDICS ANNULOFLEX RING SIZE 30;  Surgeon: Kerrin Elspeth BROCKS, MD;  Location: [facility] OR;  Service: Open Heart Surgery;  Laterality: N/A;   REVISON OF ARTERIOVENOUS FISTULA Left 01/05/2023   Procedure: REVISION OF LEFT ARTERIOVENOUS FISTULA WITH INTERPOSITION PTFE GRAFT;  Surgeon: Eliza Lonni RAMAN, MD;  Location: [facility] OR;  Service: Vascular;  Laterality: Left;  ELIQUIS  AND HOLD 48  X HOURS   RIGHT HEART CATH N/A 12/22/2020   Procedure: RIGHT HEART CATH;  Surgeon: Rolan Ezra RAMAN, MD;  [facility] INVAS[facility] CV LAB;  Service: Cardiovascular;  Laterality: N/A;   RIGHT HEART CATH N/A 01/31/2021   Procedure: RIGHT HEART CATH;  Surgeon: Cherrie Toribio SAUNDERS, MD;  Location: [facility]VAS[facility] CV LAB;  Service: Cardiovascular;  Laterality: N/A;   RIGHT HEART CATH N/A 07/29/2021   Procedure: RIGHT HEART CATH;  Surgeon: Cherrie Toribio SAUNDERS, MD;  Location: [facility] LAB;  Service: Cardiovascular;  Laterality: N/A;   RIGHT HEART CATH N/A 11/24/2021   Procedure: RIGHT HEART CATH;  Surgeon: Cherrie,  Toribio SAUNDERS, MD;  Location: [facility] INVASIVE CV LAB;  Service: Cardiovascular;  Laterality: N/A;   RIGHT HEART CATH N/A 02/14/2022   Procedure: RIGHT HEART CATH;  Surgeon: Cherrie Toribio SAUNDERS, MD;  Location: [facility]VASIVE CV LAB;  Service: Cardiovascular;  Laterality: N/A;   RIGHT HEART CATH N/A 02/22/2022   Procedure: RIGHT HEART CATH;  Surgeon: Rolan Ezra RAMAN, MD;  Location: [facility] INVASIVE CV LAB;  Service: Cardiovascular;  Laterality: N/A;   TEE WITHOUT CARDIOVERSION N/A 11/26/2020   Procedure: TRANSESOPHAGEAL ECHOCARDIOGRAM (TEE);  Surgeon: Lonni Slain, MD;  Location: [facility] ENDOSCOPY;  Service: Cardiovascular;  Laterality: N/A;   TEE WITHOUT CARDIOVERSION N/A 12/29/2020   Procedure: TRANSESOPHAGEAL ECHOCARDIOGRAM (TEE);  Surgeon: Kerrin Elspeth BROCKS, MD;  Location: [facility] OR;  Service: Open Heart Surgery;  Laterality: N/A;   TEE WITHOUT CARDIOVERSION N/A 01/20/2022   Procedure: TRANSESOPHAGEAL ECHOCARDIOGRAM (TEE);  Surgeon: Cherrie Toribio SAUNDERS, MD;  Location: [facility] ENDOSCOPY;  Service: Cardiovascular;  Laterality: N/A;   TRICUSPID VALVE REPLACEMENT N/A 12/29/2020   Procedure: 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 RING SIZE 34;  Surgeon: Kerrin Elspeth BROCKS, MD;  Location: [facility] OR;  Service: Open Heart Surgery;  Laterality: N/A;    Current Outpatient Medications  Medication Sig Dispense Refill    Accu-Chek Softclix Lancets lancets Use to check blood sugar 3 times daily. 100 each 6   acetaminophen  (TYLENOL ) 325 MG tablet Take 2 tablets (650 mg total) by mouth every 6 (six) hours as needed for mild pain or moderate pain.     albuterol  (PROVENTIL  HFA) 108 (90 Base) MCG/ACT inhaler Inhale 1-2 puffs into the lungs every 6 (six) hours as needed for wheezing or shortness of breath. 18 g 2   amiodarone  (PACERONE ) 200 MG tablet Take 1 tablet (200 mg total) by mouth daily. (Patient taking differently: Take 200 mg by mouth 2 (two) times daily.) 30 tablet 6   atorvastatin  (LIPITOR) 40 MG tablet Take 1 tablet (40 mg total) by mouth daily. 90 tablet 3   Blood Glucose Monitoring Suppl (ACCU-CHEK GUIDE) w/Device KIT Use to check blood sugar 3 times daily. 1 kit 0   busPIRone  (BUSPAR ) 5 MG tablet Take 1 tablet (5 mg total) by mouth 2 (two) times daily. 60 tablet 1   diphenhydrAMINE  (BENADRYL ) 25 MG tablet Take 25 mg by mouth every 6 (six) hours as needed for itching.     DULoxetine  (CYMBALTA ) 60 MG capsule Take 1 capsule (60 mg total) by mouth daily. 180 capsule 11   ELIQUIS  5 MG TABS tablet TAKE ONE TABLET (5MG  TOTAL) BY MOUTH TWICE DAILY @9AM -5PM 180 tablet 11   EPINEPHrine  0.3 mg/0.3 mL IJ SOAJ injection Inject 0.3 mg into the muscle as needed for anaphylaxis. 1 each 1   fluticasone  (FLONASE  ALLERGY RELIEF) 50 MCG/ACT nasal spray Place 2 sprays into both nostrils daily as needed for allergies.     glucose blood (ACCU-CHEK GUIDE TEST) test strip Use to check blood sugar 3 times daily. 100 each 6   insulin  glargine (LANTUS  SOLOSTAR) 100 UNIT/ML Solostar Pen Inject 10 Units into the skin daily. 15 mL 3   insulin  lispro (HUMALOG  KWIKPEN) 100 UNIT/ML KwikPen For blood sugars 0-150 give 0 units of insulin , 151-200 give 2 units of insulin , 201-250 give 4 units, 251-300 give 6 units, 301-350 give 8 units, 351-400 give 10 units,> 400 give 12 units and call M.D. 15 mL 2   lidocaine -prilocaine  (EMLA ) cream Apply 1  Application topically Every Tuesday,Thursday,and Saturday with dialysis.  loratadine  (CLARITIN ) 10 MG tablet Take 1 tablet (10 mg total) by mouth every morning. 30 tablet 0   metoCLOPramide  (REGLAN ) 5 MG tablet Take 1 tablet (5 mg total) by mouth every 8 (eight) hours as needed for nausea. 30 tablet 0   metoprolol  tartrate (LOPRESSOR ) 25 MG tablet Take 1 tablet (25 mg total) by mouth 2 (two) times daily. 180 tablet 3   midodrine  (PROAMATINE ) 10 MG tablet Take 1 tablet (10 mg total) by mouth See admin instructions. Take 10mg  (1 tablet) by mouth on Tuesday's, Thursday's, and Saturday's.     Multiple Vitamins-Minerals (MULTIVITAMIN WOMEN PO) Take 1 tablet by mouth daily.     nitroGLYCERIN  (NITROSTAT ) 0.4 MG SL tablet Place 1 tablet (0.4 mg total) under the tongue every 5 (five) minutes x 3 doses as needed for chest pain. 25 tablet 12   pantoprazole  (PROTONIX ) 40 MG tablet Take 1 tablet (40 mg total) by mouth daily. 90 tablet 0   polyethylene glycol powder (MIRALAX ) 17 GM/SCOOP powder Take 17 g by mouth daily as needed (constipation.).     No current facility-administered medications for this visit.    Allergies  Allergen Reactions   Bee Pollen Anaphylaxis and Other (See Comments)    UNCONFIRMED, but IS allergic to bee VENOM   Bee Venom Anaphylaxis   Sulfa Antibiotics Itching and Rash   Chlorhexidine      Unknown rxn.   Iodinated Contrast Media Nausea And Vomiting   ROS- All systems are reviewed and negative except as per the HPI above  Physical Exam: There were no vitals filed for this visit.   Wt Readings from Last 3 Encounters:  02/01/24 125.2 kg  01/25/24 125.6 kg  12/27/23 123.3 kg    Labs: Lab Results  Component Value Date   NA 138 02/01/2024   K 3.6 02/01/2024   CL 94 (L) 02/01/2024   CO2 26 02/01/2024   GLUCOSE 165 (H) 02/01/2024   BUN 29 (H) 02/01/2024   CREATININE 7.54 (H) 02/01/2024   CALCIUM  9.7 02/01/2024   PHOS 5.5 (H) 12/27/2023   MG 2.1 02/01/2024   Lab  Results  Component Value Date   INR 1.9 (H) 06/18/2023   Lab Results  Component Value Date   CHOL 151 12/26/2023   HDL 56 12/26/2023   LDLCALC 80 12/26/2023   TRIG 75 12/26/2023   GEN- The patient is well appearing, alert and oriented x 3 today.   Neck - no JVD or carotid bruit noted Lungs- Clear to ausculation bilaterally, normal work of breathing Heart- ***Regular rate and rhythm, no murmurs, rubs or gallops, PMI not laterally displaced Extremities- no clubbing, cyanosis, or edema Skin - no rash or ecchymosis noted   EKG-  ***   Assessment and Plan:  1. Persistent atrial fib/flutter  Has had afib for 10+ years  Has failed Tikosyn  and amiodarone  historically Hx Maze procedure and multiple cardioversions   She is in Afib today. Given history of prolonged QT interval I am hesitant to reload amiodarone  with a higher dose and will not plan to do so. I will continue amiodarone  without dosage change. Discussed limited options with patient given history of dialysis, which includes continuing amiodarone  200 mg daily or attempting to proceed with rate control. It is important to note her current amiodarone  200 mg daily is already providing rate control with Lopressor  12.5 mg BID. Her current BP today is 100/60 so I would be hesitant to increase her metoprolol . Rate control without amiodarone  would likely be  a definitivee challenge given her low BP at baseline. After discussion, she would like to proceed with cardioversion to try to maintain sinus rhythm. She took Trulicity  last Wednesday. No missed doses of Eliquis . From the standpoint of our clinic, I do not have other additional options for rhythm control at this time. If she has ERAF following cardioversion, would likely recommend follow up with EP to confirm long term treatment plan given age, amiodarone , dialysis, and limited options.   Informed Consent   Shared Decision Making/Informed Consent The risks (stroke, cardiac arrhythmias  rarely resulting in the need for a temporary or permanent pacemaker, skin irritation or burns and complications associated with conscious sedation including aspiration, arrhythmia, respiratory failure and death), benefits (restoration of normal sinus rhythm) and alternatives of a direct current cardioversion were explained in detail to [name] and she agrees to proceed.      2. Chronic systolic HF She has f/u with Dr. Lonni 08/30/23.   3. ESRD - HD - Tu/Th/Sat schedule Defer fluid mgmt to Nephrology  4. Secondary hypercoagulable state CHADS2VASC score of 4 No missed doses of Eliquis  5 mg BID.    Follow up ***.    Dorn Heinrich, PA-C [facility] [address] [place], [place] [address] [phone] 10/20/22 10:25 am

## 2024-02-14 ENCOUNTER — Other Ambulatory Visit (INDEPENDENT_AMBULATORY_CARE_PROVIDER_SITE_OTHER): Payer: Self-pay | Admitting: Primary Care

## 2024-02-14 DIAGNOSIS — Z992 Dependence on renal dialysis: Secondary | ICD-10-CM | POA: Diagnosis not present

## 2024-02-14 DIAGNOSIS — D508 Other iron deficiency anemias: Secondary | ICD-10-CM | POA: Diagnosis not present

## 2024-02-14 DIAGNOSIS — N186 End stage renal disease: Secondary | ICD-10-CM | POA: Diagnosis not present

## 2024-02-14 DIAGNOSIS — D631 Anemia in chronic kidney disease: Secondary | ICD-10-CM | POA: Diagnosis not present

## 2024-02-14 DIAGNOSIS — E1129 Type 2 diabetes mellitus with other diabetic kidney complication: Secondary | ICD-10-CM | POA: Diagnosis not present

## 2024-02-14 DIAGNOSIS — I953 Hypotension of hemodialysis: Secondary | ICD-10-CM | POA: Diagnosis not present

## 2024-02-14 DIAGNOSIS — N2581 Secondary hyperparathyroidism of renal origin: Secondary | ICD-10-CM | POA: Diagnosis not present

## 2024-02-15 DIAGNOSIS — Z7901 Long term (current) use of anticoagulants: Secondary | ICD-10-CM | POA: Diagnosis not present

## 2024-02-15 DIAGNOSIS — E1122 Type 2 diabetes mellitus with diabetic chronic kidney disease: Secondary | ICD-10-CM | POA: Diagnosis not present

## 2024-02-15 DIAGNOSIS — D631 Anemia in chronic kidney disease: Secondary | ICD-10-CM | POA: Diagnosis not present

## 2024-02-15 DIAGNOSIS — I5042 Chronic combined systolic (congestive) and diastolic (congestive) heart failure: Secondary | ICD-10-CM | POA: Diagnosis not present

## 2024-02-15 DIAGNOSIS — I4819 Other persistent atrial fibrillation: Secondary | ICD-10-CM | POA: Diagnosis not present

## 2024-02-15 DIAGNOSIS — E785 Hyperlipidemia, unspecified: Secondary | ICD-10-CM | POA: Diagnosis not present

## 2024-02-15 DIAGNOSIS — N186 End stage renal disease: Secondary | ICD-10-CM | POA: Diagnosis not present

## 2024-02-15 DIAGNOSIS — M940 Chondrocostal junction syndrome [Tietze]: Secondary | ICD-10-CM | POA: Diagnosis not present

## 2024-02-15 DIAGNOSIS — I132 Hypertensive heart and chronic kidney disease with heart failure and with stage 5 chronic kidney disease, or end stage renal disease: Secondary | ICD-10-CM | POA: Diagnosis not present

## 2024-02-15 DIAGNOSIS — Z8616 Personal history of COVID-19: Secondary | ICD-10-CM | POA: Diagnosis not present

## 2024-02-15 DIAGNOSIS — M1990 Unspecified osteoarthritis, unspecified site: Secondary | ICD-10-CM | POA: Diagnosis not present

## 2024-02-15 DIAGNOSIS — G4733 Obstructive sleep apnea (adult) (pediatric): Secondary | ICD-10-CM | POA: Diagnosis not present

## 2024-02-15 DIAGNOSIS — Z992 Dependence on renal dialysis: Secondary | ICD-10-CM | POA: Diagnosis not present

## 2024-02-15 DIAGNOSIS — K219 Gastro-esophageal reflux disease without esophagitis: Secondary | ICD-10-CM | POA: Diagnosis not present

## 2024-02-15 DIAGNOSIS — J309 Allergic rhinitis, unspecified: Secondary | ICD-10-CM | POA: Diagnosis not present

## 2024-02-15 DIAGNOSIS — I081 Rheumatic disorders of both mitral and tricuspid valves: Secondary | ICD-10-CM | POA: Diagnosis not present

## 2024-02-15 DIAGNOSIS — I428 Other cardiomyopathies: Secondary | ICD-10-CM | POA: Diagnosis not present

## 2024-02-15 DIAGNOSIS — R072 Precordial pain: Secondary | ICD-10-CM | POA: Diagnosis not present

## 2024-02-15 DIAGNOSIS — J9601 Acute respiratory failure with hypoxia: Secondary | ICD-10-CM | POA: Diagnosis not present

## 2024-02-15 DIAGNOSIS — J4489 Other specified chronic obstructive pulmonary disease: Secondary | ICD-10-CM | POA: Diagnosis not present

## 2024-02-15 DIAGNOSIS — Z794 Long term (current) use of insulin: Secondary | ICD-10-CM | POA: Diagnosis not present

## 2024-02-16 DIAGNOSIS — D631 Anemia in chronic kidney disease: Secondary | ICD-10-CM | POA: Diagnosis not present

## 2024-02-16 DIAGNOSIS — I953 Hypotension of hemodialysis: Secondary | ICD-10-CM | POA: Diagnosis not present

## 2024-02-16 DIAGNOSIS — Z992 Dependence on renal dialysis: Secondary | ICD-10-CM | POA: Diagnosis not present

## 2024-02-16 DIAGNOSIS — D508 Other iron deficiency anemias: Secondary | ICD-10-CM | POA: Diagnosis not present

## 2024-02-16 DIAGNOSIS — N186 End stage renal disease: Secondary | ICD-10-CM | POA: Diagnosis not present

## 2024-02-16 DIAGNOSIS — E1129 Type 2 diabetes mellitus with other diabetic kidney complication: Secondary | ICD-10-CM | POA: Diagnosis not present

## 2024-02-16 DIAGNOSIS — N2581 Secondary hyperparathyroidism of renal origin: Secondary | ICD-10-CM | POA: Diagnosis not present

## 2024-02-18 ENCOUNTER — Ambulatory Visit (HOSPITAL_BASED_OUTPATIENT_CLINIC_OR_DEPARTMENT_OTHER): Attending: Family | Admitting: Cardiology

## 2024-02-18 DIAGNOSIS — I4819 Other persistent atrial fibrillation: Secondary | ICD-10-CM | POA: Diagnosis not present

## 2024-02-18 DIAGNOSIS — E1122 Type 2 diabetes mellitus with diabetic chronic kidney disease: Secondary | ICD-10-CM | POA: Diagnosis not present

## 2024-02-18 DIAGNOSIS — I132 Hypertensive heart and chronic kidney disease with heart failure and with stage 5 chronic kidney disease, or end stage renal disease: Secondary | ICD-10-CM | POA: Diagnosis not present

## 2024-02-18 DIAGNOSIS — Z992 Dependence on renal dialysis: Secondary | ICD-10-CM | POA: Diagnosis not present

## 2024-02-18 DIAGNOSIS — Z794 Long term (current) use of insulin: Secondary | ICD-10-CM | POA: Diagnosis not present

## 2024-02-18 DIAGNOSIS — I428 Other cardiomyopathies: Secondary | ICD-10-CM | POA: Diagnosis not present

## 2024-02-18 DIAGNOSIS — R072 Precordial pain: Secondary | ICD-10-CM | POA: Diagnosis not present

## 2024-02-18 DIAGNOSIS — M1990 Unspecified osteoarthritis, unspecified site: Secondary | ICD-10-CM | POA: Diagnosis not present

## 2024-02-18 DIAGNOSIS — D631 Anemia in chronic kidney disease: Secondary | ICD-10-CM | POA: Diagnosis not present

## 2024-02-18 DIAGNOSIS — Z7901 Long term (current) use of anticoagulants: Secondary | ICD-10-CM | POA: Diagnosis not present

## 2024-02-18 DIAGNOSIS — N186 End stage renal disease: Secondary | ICD-10-CM | POA: Diagnosis not present

## 2024-02-18 DIAGNOSIS — J4489 Other specified chronic obstructive pulmonary disease: Secondary | ICD-10-CM | POA: Diagnosis not present

## 2024-02-18 DIAGNOSIS — I5042 Chronic combined systolic (congestive) and diastolic (congestive) heart failure: Secondary | ICD-10-CM | POA: Diagnosis not present

## 2024-02-18 DIAGNOSIS — J309 Allergic rhinitis, unspecified: Secondary | ICD-10-CM | POA: Diagnosis not present

## 2024-02-18 DIAGNOSIS — Z8616 Personal history of COVID-19: Secondary | ICD-10-CM | POA: Diagnosis not present

## 2024-02-18 DIAGNOSIS — I081 Rheumatic disorders of both mitral and tricuspid valves: Secondary | ICD-10-CM | POA: Diagnosis not present

## 2024-02-18 DIAGNOSIS — G4733 Obstructive sleep apnea (adult) (pediatric): Secondary | ICD-10-CM | POA: Diagnosis not present

## 2024-02-18 DIAGNOSIS — E785 Hyperlipidemia, unspecified: Secondary | ICD-10-CM | POA: Diagnosis not present

## 2024-02-18 DIAGNOSIS — J9601 Acute respiratory failure with hypoxia: Secondary | ICD-10-CM | POA: Diagnosis not present

## 2024-02-18 DIAGNOSIS — K219 Gastro-esophageal reflux disease without esophagitis: Secondary | ICD-10-CM | POA: Diagnosis not present

## 2024-02-18 DIAGNOSIS — M940 Chondrocostal junction syndrome [Tietze]: Secondary | ICD-10-CM | POA: Diagnosis not present

## 2024-02-19 ENCOUNTER — Other Ambulatory Visit (INDEPENDENT_AMBULATORY_CARE_PROVIDER_SITE_OTHER): Payer: Self-pay | Admitting: Primary Care

## 2024-02-19 DIAGNOSIS — N2581 Secondary hyperparathyroidism of renal origin: Secondary | ICD-10-CM | POA: Diagnosis not present

## 2024-02-19 DIAGNOSIS — D508 Other iron deficiency anemias: Secondary | ICD-10-CM | POA: Diagnosis not present

## 2024-02-19 DIAGNOSIS — N186 End stage renal disease: Secondary | ICD-10-CM | POA: Diagnosis not present

## 2024-02-19 DIAGNOSIS — D631 Anemia in chronic kidney disease: Secondary | ICD-10-CM | POA: Diagnosis not present

## 2024-02-19 DIAGNOSIS — F4323 Adjustment disorder with mixed anxiety and depressed mood: Secondary | ICD-10-CM

## 2024-02-19 DIAGNOSIS — D689 Coagulation defect, unspecified: Secondary | ICD-10-CM | POA: Diagnosis not present

## 2024-02-19 DIAGNOSIS — E1129 Type 2 diabetes mellitus with other diabetic kidney complication: Secondary | ICD-10-CM | POA: Diagnosis not present

## 2024-02-19 DIAGNOSIS — Z992 Dependence on renal dialysis: Secondary | ICD-10-CM | POA: Diagnosis not present

## 2024-02-19 NOTE — Telephone Encounter (Unsigned)
 Copied from CRM [id number]. Topic: Clinical - Medication Refill >> Feb 19, 2024  9:23 AM Sasha H wrote: Medication: busPIRone  (BUSPAR ) 5 MG tablet  loratadine  (CLARITIN ) 10 MG tablet  Has the patient contacted their pharmacy? No (Agent: If no, request that the patient contact the pharmacy for the refill. If patient does not wish to contact the pharmacy document the reason why and proceed with request.) (Agent: If yes, when and what did the pharmacy advise?)  This is the patient's preferred pharmacy:   [facility] [place] [address] [place] [address] Phone: [phone] Fax: [phone]   Is this the correct pharmacy for this prescription? Yes If no, delete pharmacy and type the correct one.   Has the prescription been filled recently? Yes  Is the patient out of the medication? Yes  Has the patient been seen for an appointment in the last year OR does the patient have an upcoming appointment? Yes  Can we respond through MyChart? Yes  Agent: Please be advised that Rx refills may take up to 3 business days. We ask that you follow-up with your pharmacy.

## 2024-02-20 DIAGNOSIS — N186 End stage renal disease: Secondary | ICD-10-CM | POA: Diagnosis not present

## 2024-02-20 DIAGNOSIS — J9601 Acute respiratory failure with hypoxia: Secondary | ICD-10-CM | POA: Diagnosis not present

## 2024-02-20 DIAGNOSIS — M1990 Unspecified osteoarthritis, unspecified site: Secondary | ICD-10-CM | POA: Diagnosis not present

## 2024-02-20 DIAGNOSIS — E1122 Type 2 diabetes mellitus with diabetic chronic kidney disease: Secondary | ICD-10-CM | POA: Diagnosis not present

## 2024-02-20 DIAGNOSIS — Z8616 Personal history of COVID-19: Secondary | ICD-10-CM | POA: Diagnosis not present

## 2024-02-20 DIAGNOSIS — I428 Other cardiomyopathies: Secondary | ICD-10-CM | POA: Diagnosis not present

## 2024-02-20 DIAGNOSIS — E785 Hyperlipidemia, unspecified: Secondary | ICD-10-CM | POA: Diagnosis not present

## 2024-02-20 DIAGNOSIS — G4733 Obstructive sleep apnea (adult) (pediatric): Secondary | ICD-10-CM | POA: Diagnosis not present

## 2024-02-20 DIAGNOSIS — J309 Allergic rhinitis, unspecified: Secondary | ICD-10-CM | POA: Diagnosis not present

## 2024-02-20 DIAGNOSIS — K219 Gastro-esophageal reflux disease without esophagitis: Secondary | ICD-10-CM | POA: Diagnosis not present

## 2024-02-20 DIAGNOSIS — I5042 Chronic combined systolic (congestive) and diastolic (congestive) heart failure: Secondary | ICD-10-CM | POA: Diagnosis not present

## 2024-02-20 DIAGNOSIS — I132 Hypertensive heart and chronic kidney disease with heart failure and with stage 5 chronic kidney disease, or end stage renal disease: Secondary | ICD-10-CM | POA: Diagnosis not present

## 2024-02-20 DIAGNOSIS — I081 Rheumatic disorders of both mitral and tricuspid valves: Secondary | ICD-10-CM | POA: Diagnosis not present

## 2024-02-20 DIAGNOSIS — M940 Chondrocostal junction syndrome [Tietze]: Secondary | ICD-10-CM | POA: Diagnosis not present

## 2024-02-20 DIAGNOSIS — D631 Anemia in chronic kidney disease: Secondary | ICD-10-CM | POA: Diagnosis not present

## 2024-02-20 DIAGNOSIS — Z992 Dependence on renal dialysis: Secondary | ICD-10-CM | POA: Diagnosis not present

## 2024-02-20 DIAGNOSIS — Z7901 Long term (current) use of anticoagulants: Secondary | ICD-10-CM | POA: Diagnosis not present

## 2024-02-20 DIAGNOSIS — J4489 Other specified chronic obstructive pulmonary disease: Secondary | ICD-10-CM | POA: Diagnosis not present

## 2024-02-20 DIAGNOSIS — I4819 Other persistent atrial fibrillation: Secondary | ICD-10-CM | POA: Diagnosis not present

## 2024-02-20 DIAGNOSIS — Z794 Long term (current) use of insulin: Secondary | ICD-10-CM | POA: Diagnosis not present

## 2024-02-20 DIAGNOSIS — R072 Precordial pain: Secondary | ICD-10-CM | POA: Diagnosis not present

## 2024-02-20 NOTE — Telephone Encounter (Signed)
 Duplicate request, refilled on 02/14/24.  Requested Prescriptions  Pending Prescriptions Disp Refills   busPIRone  (BUSPAR ) 5 MG tablet 180 tablet 1     Psychiatry: Anxiolytics/Hypnotics - Non-controlled Passed - 02/20/2024  4:50 PM      Passed - Valid encounter within last 12 months    Recent Outpatient Visits           2 months ago Generalized anxiety disorder   [place] [facility] Celestia Rosaline SQUIBB, NP   3 months ago Cough productive of purulent sputum   [place] [facility] Celestia Rosaline SQUIBB, NP   1 year ago Generalized anxiety disorder   [place] [facility] Celestia Rosaline SQUIBB, NP   1 year ago Diabetes mellitus type 2 in obese [facility])   [place] [facility] Celestia Rosaline SQUIBB, NP   1 year ago Diabetes mellitus type 2 in obese [facility])   [place] [facility] [place] Delbert Clam, MD       Future Appointments             In 2 months Lonni Slain, MD [place] [facility] at [facility]             loratadine  (CLARITIN ) 10 MG tablet 30 tablet 0    Sig: Take 1 tablet (10 mg total) by mouth every morning.     Ear, Nose, and Throat:  Antihistamines 2 Failed - 02/20/2024  4:50 PM      Failed - Cr in normal range and within 360 days    Creatinine, Ser  Date Value Ref Range Status  02/01/2024 7.54 (H) 0.44 - 1.00 mg/dL Final   Creatinine, Urine  Date Value Ref Range Status  07/29/2021 97.28 mg/dL Final    Comment:    Performed at [facility], [address] [place], [place] [address]         Passed - Valid encounter within last 12 months    Recent Outpatient Visits           2 months ago Generalized anxiety disorder   [place] [facility] Celestia Rosaline SQUIBB, NP   3 months ago Cough productive of purulent sputum   [place] [facility] Celestia Rosaline SQUIBB, NP   1 year  ago Generalized anxiety disorder   [place] [facility] Celestia Rosaline SQUIBB, NP   1 year ago Diabetes mellitus type 2 in obese [facility])   [place] [facility] Celestia Rosaline SQUIBB, NP   1 year ago Diabetes mellitus type 2 in obese [facility])    [facility] [place] Delbert Clam, MD       Future Appointments             In 2 months Lonni Slain, MD [facility] & Vascular at [facility]

## 2024-02-21 DIAGNOSIS — D631 Anemia in chronic kidney disease: Secondary | ICD-10-CM | POA: Diagnosis not present

## 2024-02-21 DIAGNOSIS — E1129 Type 2 diabetes mellitus with other diabetic kidney complication: Secondary | ICD-10-CM | POA: Diagnosis not present

## 2024-02-21 DIAGNOSIS — D508 Other iron deficiency anemias: Secondary | ICD-10-CM | POA: Diagnosis not present

## 2024-02-21 DIAGNOSIS — N2581 Secondary hyperparathyroidism of renal origin: Secondary | ICD-10-CM | POA: Diagnosis not present

## 2024-02-21 DIAGNOSIS — Z992 Dependence on renal dialysis: Secondary | ICD-10-CM | POA: Diagnosis not present

## 2024-02-21 DIAGNOSIS — N186 End stage renal disease: Secondary | ICD-10-CM | POA: Diagnosis not present

## 2024-02-21 DIAGNOSIS — D689 Coagulation defect, unspecified: Secondary | ICD-10-CM | POA: Diagnosis not present

## 2024-02-23 DIAGNOSIS — N186 End stage renal disease: Secondary | ICD-10-CM | POA: Diagnosis not present

## 2024-02-23 DIAGNOSIS — D689 Coagulation defect, unspecified: Secondary | ICD-10-CM | POA: Diagnosis not present

## 2024-02-23 DIAGNOSIS — E1129 Type 2 diabetes mellitus with other diabetic kidney complication: Secondary | ICD-10-CM | POA: Diagnosis not present

## 2024-02-23 DIAGNOSIS — D631 Anemia in chronic kidney disease: Secondary | ICD-10-CM | POA: Diagnosis not present

## 2024-02-23 DIAGNOSIS — D508 Other iron deficiency anemias: Secondary | ICD-10-CM | POA: Diagnosis not present

## 2024-02-23 DIAGNOSIS — N2581 Secondary hyperparathyroidism of renal origin: Secondary | ICD-10-CM | POA: Diagnosis not present

## 2024-02-23 DIAGNOSIS — Z992 Dependence on renal dialysis: Secondary | ICD-10-CM | POA: Diagnosis not present

## 2024-02-25 ENCOUNTER — Telehealth (INDEPENDENT_AMBULATORY_CARE_PROVIDER_SITE_OTHER): Payer: Self-pay | Admitting: Primary Care

## 2024-02-25 NOTE — Telephone Encounter (Unsigned)
 Copied from CRM [id number]. Topic: Clinical - Medical Advice >> Feb 25, 2024  3:41 PM Tiffini S wrote: Reason for CRM: Patient called to inquire about a form / documentation for the 3 in 1 toilet chair. To please call [facility] ph [phone]/ [phone] and ask for therapist named Bella or the nurse assigned to the case  and fax [phone].  Patient asked for a call back at [phone].

## 2024-02-26 DIAGNOSIS — N186 End stage renal disease: Secondary | ICD-10-CM | POA: Diagnosis not present

## 2024-02-26 DIAGNOSIS — E1129 Type 2 diabetes mellitus with other diabetic kidney complication: Secondary | ICD-10-CM | POA: Diagnosis not present

## 2024-02-26 DIAGNOSIS — N2581 Secondary hyperparathyroidism of renal origin: Secondary | ICD-10-CM | POA: Diagnosis not present

## 2024-02-26 DIAGNOSIS — D631 Anemia in chronic kidney disease: Secondary | ICD-10-CM | POA: Diagnosis not present

## 2024-02-26 DIAGNOSIS — Z992 Dependence on renal dialysis: Secondary | ICD-10-CM | POA: Diagnosis not present

## 2024-02-26 DIAGNOSIS — D689 Coagulation defect, unspecified: Secondary | ICD-10-CM | POA: Diagnosis not present

## 2024-02-26 DIAGNOSIS — D508 Other iron deficiency anemias: Secondary | ICD-10-CM | POA: Diagnosis not present

## 2024-02-27 DIAGNOSIS — I5042 Chronic combined systolic (congestive) and diastolic (congestive) heart failure: Secondary | ICD-10-CM | POA: Diagnosis not present

## 2024-02-27 DIAGNOSIS — D631 Anemia in chronic kidney disease: Secondary | ICD-10-CM | POA: Diagnosis not present

## 2024-02-27 DIAGNOSIS — R072 Precordial pain: Secondary | ICD-10-CM | POA: Diagnosis not present

## 2024-02-27 DIAGNOSIS — J4489 Other specified chronic obstructive pulmonary disease: Secondary | ICD-10-CM | POA: Diagnosis not present

## 2024-02-27 DIAGNOSIS — Z794 Long term (current) use of insulin: Secondary | ICD-10-CM | POA: Diagnosis not present

## 2024-02-27 DIAGNOSIS — N186 End stage renal disease: Secondary | ICD-10-CM | POA: Diagnosis not present

## 2024-02-27 DIAGNOSIS — Z8616 Personal history of COVID-19: Secondary | ICD-10-CM | POA: Diagnosis not present

## 2024-02-27 DIAGNOSIS — Z7901 Long term (current) use of anticoagulants: Secondary | ICD-10-CM | POA: Diagnosis not present

## 2024-02-27 DIAGNOSIS — G4733 Obstructive sleep apnea (adult) (pediatric): Secondary | ICD-10-CM | POA: Diagnosis not present

## 2024-02-27 DIAGNOSIS — K219 Gastro-esophageal reflux disease without esophagitis: Secondary | ICD-10-CM | POA: Diagnosis not present

## 2024-02-27 DIAGNOSIS — M1990 Unspecified osteoarthritis, unspecified site: Secondary | ICD-10-CM | POA: Diagnosis not present

## 2024-02-27 DIAGNOSIS — M940 Chondrocostal junction syndrome [Tietze]: Secondary | ICD-10-CM | POA: Diagnosis not present

## 2024-02-27 DIAGNOSIS — I132 Hypertensive heart and chronic kidney disease with heart failure and with stage 5 chronic kidney disease, or end stage renal disease: Secondary | ICD-10-CM | POA: Diagnosis not present

## 2024-02-27 DIAGNOSIS — J309 Allergic rhinitis, unspecified: Secondary | ICD-10-CM | POA: Diagnosis not present

## 2024-02-27 DIAGNOSIS — J9601 Acute respiratory failure with hypoxia: Secondary | ICD-10-CM | POA: Diagnosis not present

## 2024-02-27 DIAGNOSIS — I081 Rheumatic disorders of both mitral and tricuspid valves: Secondary | ICD-10-CM | POA: Diagnosis not present

## 2024-02-27 DIAGNOSIS — E1122 Type 2 diabetes mellitus with diabetic chronic kidney disease: Secondary | ICD-10-CM | POA: Diagnosis not present

## 2024-02-27 DIAGNOSIS — E785 Hyperlipidemia, unspecified: Secondary | ICD-10-CM | POA: Diagnosis not present

## 2024-02-27 DIAGNOSIS — Z992 Dependence on renal dialysis: Secondary | ICD-10-CM | POA: Diagnosis not present

## 2024-02-27 DIAGNOSIS — I4819 Other persistent atrial fibrillation: Secondary | ICD-10-CM | POA: Diagnosis not present

## 2024-02-27 DIAGNOSIS — I428 Other cardiomyopathies: Secondary | ICD-10-CM | POA: Diagnosis not present

## 2024-02-28 ENCOUNTER — Other Ambulatory Visit (INDEPENDENT_AMBULATORY_CARE_PROVIDER_SITE_OTHER): Payer: Self-pay | Admitting: Primary Care

## 2024-02-28 DIAGNOSIS — D508 Other iron deficiency anemias: Secondary | ICD-10-CM | POA: Diagnosis not present

## 2024-02-28 DIAGNOSIS — D689 Coagulation defect, unspecified: Secondary | ICD-10-CM | POA: Diagnosis not present

## 2024-02-28 DIAGNOSIS — D631 Anemia in chronic kidney disease: Secondary | ICD-10-CM | POA: Diagnosis not present

## 2024-02-28 DIAGNOSIS — N186 End stage renal disease: Secondary | ICD-10-CM | POA: Diagnosis not present

## 2024-02-28 DIAGNOSIS — E1142 Type 2 diabetes mellitus with diabetic polyneuropathy: Secondary | ICD-10-CM

## 2024-02-28 DIAGNOSIS — N2581 Secondary hyperparathyroidism of renal origin: Secondary | ICD-10-CM | POA: Diagnosis not present

## 2024-02-28 DIAGNOSIS — Z992 Dependence on renal dialysis: Secondary | ICD-10-CM | POA: Diagnosis not present

## 2024-02-28 DIAGNOSIS — Z760 Encounter for issue of repeat prescription: Secondary | ICD-10-CM

## 2024-02-28 DIAGNOSIS — E1129 Type 2 diabetes mellitus with other diabetic kidney complication: Secondary | ICD-10-CM | POA: Diagnosis not present

## 2024-02-28 DIAGNOSIS — F32A Depression, unspecified: Secondary | ICD-10-CM

## 2024-02-29 ENCOUNTER — Ambulatory Visit (HOSPITAL_COMMUNITY)
Admission: RE | Admit: 2024-02-29 | Discharge: 2024-02-29 | Disposition: A | Discharge status: Home / Self Care | Source: Ambulatory Visit | Attending: Internal Medicine | Admitting: Internal Medicine

## 2024-02-29 VITALS — BP 100/66 | HR 108 | Ht 67.0 in | Wt 287.2 lb

## 2024-02-29 DIAGNOSIS — I4819 Other persistent atrial fibrillation: Secondary | ICD-10-CM | POA: Diagnosis not present

## 2024-02-29 DIAGNOSIS — Z79899 Other long term (current) drug therapy: Secondary | ICD-10-CM

## 2024-02-29 DIAGNOSIS — D6869 Other thrombophilia: Secondary | ICD-10-CM

## 2024-02-29 DIAGNOSIS — Z5181 Encounter for therapeutic drug level monitoring: Secondary | ICD-10-CM

## 2024-02-29 DIAGNOSIS — I4891 Unspecified atrial fibrillation: Secondary | ICD-10-CM | POA: Diagnosis not present

## 2024-02-29 DIAGNOSIS — I4892 Unspecified atrial flutter: Secondary | ICD-10-CM

## 2024-02-29 LAB — CBC
Hematocrit: 31.9 % — ABNORMAL LOW (ref 34.0–46.6)
Hemoglobin: 10.1 g/dL — ABNORMAL LOW (ref 11.1–15.9)
MCH: 30.7 pg (ref 26.6–33.0)
MCHC: 31.7 g/dL (ref 31.5–35.7)
MCV: 97 fL (ref 79–97)
Platelets: 149 x10E3/uL — ABNORMAL LOW (ref 150–450)
RBC: 3.29 x10E6/uL — ABNORMAL LOW (ref 3.77–5.28)
RDW: 14.7 % (ref 11.7–15.4)
WBC: 6.2 x10E3/uL (ref 3.4–10.8)

## 2024-02-29 LAB — BASIC METABOLIC PANEL WITH GFR
BUN/Creatinine Ratio: 4 — ABNORMAL LOW (ref 12–28)
BUN: 26 mg/dL (ref 8–27)
CO2: 32 mmol/L — ABNORMAL HIGH (ref 20–29)
Calcium: 9.7 mg/dL (ref 8.7–10.3)
Chloride: 93 mmol/L — ABNORMAL LOW (ref 96–106)
Creatinine, Ser: 6.93 mg/dL — ABNORMAL HIGH (ref 0.57–1.00)
Glucose: 119 mg/dL — ABNORMAL HIGH (ref 70–99)
Potassium: 4.2 mmol/L (ref 3.5–5.2)
Sodium: 137 mmol/L (ref 134–144)
eGFR: 6 mL/min/1.73 — ABNORMAL LOW (ref >59)

## 2024-02-29 NOTE — Patient Instructions (Addendum)
 Cardioversion scheduled for: [facility] 03/05/24 9:30 AM   - Arrive at the [employer] [facility] ([address]  and check in with ADMITTING at 9:30AM   - Do not eat or drink anything after midnight the night prior to your procedure.   - Take all your morning medication (except diabetic medications) with a sip of water prior to arrival.  - Do NOT miss any doses of your blood thinner - if you should miss a dose or take a dose more than 4 hours late -- please notify our office immediately.  - You will not be able to drive home after your procedure. Please ensure you have a responsible adult to drive you home. You will need someone with you for 24 hours post procedure.     - Expect to be in the procedural area approximately 2 hours.   - If you feel as if you go back into normal rhythm prior to scheduled cardioversion, please notify our office immediately.   If your procedure is canceled in the cardioversion suite you will be charged a cancellation fee.

## 2024-02-29 NOTE — Telephone Encounter (Signed)
 Request to change to mail order  Requested Prescriptions  Pending Prescriptions Disp Refills   DULoxetine  (CYMBALTA ) 60 MG capsule [Pharmacy Med Name: duloxetine  60 mg capsule,delayed release] 180 capsule 11    Sig: TAKE ONE CAPSULE (60MG  TOTAL) BY MOUTH DAILY AT 9AM     Psychiatry: Antidepressants - SNRI - duloxetine  Failed - 02/29/2024  2:56 PM      Failed - Cr in normal range and within 360 days    Creatinine, Ser  Date Value Ref Range Status  02/29/2024 6.93 (H) 0.57 - 1.00 mg/dL Final   Creatinine, Urine  Date Value Ref Range Status  07/29/2021 97.28 mg/dL Final    Comment:    Performed at [facility], [address] [place], [place] [address]         Failed - eGFR is 30 or above and within 360 days    GFR calc Af Amer  Date Value Ref Range Status  12/24/2023 5 (L) >60 mL/min Final   GFR, Estimated  Date Value Ref Range Status  02/01/2024 5 (L) >60 mL/min Final    Comment:    (NOTE) Calculated using the CKD-EPI Creatinine Equation (2021)    eGFR  Date Value Ref Range Status  02/29/2024 6 (L) >59 mL/min/1.73 Final         Passed - Completed PHQ-2 or PHQ-9 in the last 360 days      Passed - Last BP in normal range    BP Readings from Last 1 Encounters:  02/29/24 100/66         Passed - Valid encounter within last 6 months    Recent Outpatient Visits           3 months ago Generalized anxiety disorder   [place] [facility] Celestia Rosaline SQUIBB, NP   3 months ago Cough productive of purulent sputum   [place] [facility] Celestia Rosaline SQUIBB, NP   1 year ago Generalized anxiety disorder   [place] [facility] Celestia Rosaline SQUIBB, NP   1 year ago Diabetes mellitus type 2 in obese [facility])   [place] [facility] Celestia Rosaline SQUIBB, NP   1 year ago Diabetes mellitus type 2 in obese [facility])    [facility] Delbert Clam,  MD       Future Appointments             In 2 months Lonni Slain, MD [facility] at [facility]

## 2024-02-29 NOTE — Progress Notes (Addendum)
 Primary Care Physician: Celestia Rosaline SQUIBB, NP Referring Physician: Dr Lonni Elveria JONETTA [name] is a 68 y.o. female with , type 2 DM, morbid obesity, CKD, a a hx of  many  years of longstanding persisitent afib ( ekg's reviewed from all the back to 2011 show persistent  afib) , previoulsy  followed by Dr. Levern until recently, established with  Dr. Lonni, 03/31/20.  She set her up for a cardioversion, she did convert to SR, but she had ERAF possibly after just a few hours. 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 She  did at one time weigh around 500-600 lbs. Her left atrium size is 5.3 indicating severe left atrial enlargement.She  is bothered with fatigue and shortness of breath. She is on pradaxa  150 mg bid. Dr. Lonni wanted me to discuss tikosyn  admit with the pt.  She was admitted for Tikosyn  11/02/20. Unfortunately, Tikosyn  did not work to keep her in SR. She was started on amiodarone  200 mg bid and d/c home. She then went on to have  an hospitalization in May 2022,  with acute on chronic HF, wight up 30 lbs and was diuresed. She then went on to  Mitral and Tricuspid Valvular ring annuloplasty, Left and Right sided MAZE procedure, and clipping of her Left Atrial Appendage with Dr. Kerrin.  She tolerated the procedure without difficulty and was taken to the SICU in stable condition.  she was in NSR after surgery. SHe was followed by HF team. Her creatinine peaked at 4.36. She was supported on IV milrinone   for several days after surgery.   She then was hospitalized again 6/11 thru 6/15 with acute exacerbation of CHF. Again diuresed and had cardioversion. She was in SR on discharge.   Admitted again 02/07/21 thru 02/10/21 with substernal chest pain, nausea/vomiting and abdominal pain. She was back in afib with RVR. She again had repeat cardioversion.   She again had admission  02/25/21 with afib with RVR. She was out of amiodarone   x one week. She was started on IV amiodarone  and converted. SHe was discharged on 200 mg bid of amiodarone .  EP consulted and wanted to defer treatment for 6 months until repeat echo. At that time there was discussion of PPM with AV nodal ablation.  She  was then seen acutely  by Jodie Passey, PA, for Dr. Kelsie,  8/25 for being in persistent afib. She was not felt to be a current candidate for PPM with AV nodal ablation as she had just had Maze procedure in May. She was set up for cardioversion 04/19/21.   F/u in Afib clinic, 04/26/21, for successful cardioversion 04/19/21.  She was back in afib with v rate at 106 bpm. Discussion with HF and torsemide  was increased.   She saw Dr. Kelsie on 05/30/21 and was in SR. Plan was to continue amiodarone  and eliquis .   Admitted 4/23 with recurrent HR in the setting of recurrent AF. Underwent DCCV and converted to NSR on 11/25/21.   Seen by Dr. Cindie on 01/17/22 and determined to not be a candidate for catheter ablation and not a good candidate for AV junction ablation and PPM. Continued on amiodarone . Readmitted 5/30-6/5 for acute on chronic CHF.  Underwent DCCV on 01/31/22 for atrial flutter and successfully converted to NSR.   Admitted on 03/08/22 due to acute exacerbation of CHF and due to worsening renal function and progression to ESRD underwent  placement of right IJ catheter for HD on 03/14/22. S/p placement of left brachiocephalic fistula on 03/17/22.   HD schedule T-Th-Sat.  Recent ED visits on 09/03/22 and 09/14/22 for chest pain, nausea, and vomiting. ED EKG on 09/04/22 showed 2:1 atrial flutter.   On follow up today, her primary concern today is nausea, stomach ache, and vomiting. She currently denies chest pain or shortness of breath. She feels like her n/v and stomach issues have been going on for a while now. She voices frustration in ability to perform daily activities secondary to current symptoms. She states prescription given for Velphoro to take  TID; she was advised this medication would help breakdown her food more easily. She states it has not helped. She does endorse previously trying a medication that melted in her mouth helped somewhat for her nausea.   She has been compliant with her amiodarone  and eliquis . Denies any bleeding concerns.   On follow up 07/16/23, she is currently in Afib. Recent hospital admission 11/12-15 for epigastric pain, nausea, and vomiting likely secondary to chronic gastroparesis. She was noncompliant with 2 missed dialysis sessions prior to hospital admission. Daughter contacted office on 11/21 noting patient was at dialysis and her monitor flatlined. She currently takes amiodarone  200 mg daily and metoprolol  12.5 mg BID. She has a history of noted QT prolongation. Previously, Dr. Cindie has noted patient is not a good candidate for ablation or AV nodal ablation/PPM. No missed doses of Eliquis  5 mg BID.   On follow up 02/29/24, she is currently in atrial flutter with RVR. Seen by Cardiology on 01/25/24 and noted to be in atrial flutter at that time. Lopressor  increased to 25 mg BID and to remain on amiodarone  200 mg BID. Reviewed case with Dr. Cindie to consider repeat DCCV and follow up with him to discuss AV nodal ablation. Patient went to ED on 6/13 for chest pain with resulting negative workup; ECG still showed atrial flutter. No missed doses of Eliquis  5 mg BID. Patient feels unwell and tired when out of rhythm. She notes to have been shocked many, many times and asks today if there is something that can be done differently with her arrhythmia management.   Today, she denies symptoms of palpitations, chest pain, shortness of breath, orthopnea, PND, lower extremity edema, dizziness, presyncope, syncope, or neurologic sequela. The patient is tolerating medications without difficulties.  Past Medical History:  Diagnosis Date   Acute on chronic systolic CHF (congestive heart failure) (HCC) 12/21/2020   Allergic  rhinitis    Anemia    Arthritis    Asthma    Chronic diastolic CHF (congestive heart failure) (HCC)    Chronic kidney disease    Dialysis Tu/TH/Sa   COPD (chronic obstructive pulmonary disease) (HCC)    COVID-19 03/28/2023   Depression    DM (diabetes mellitus) (HCC)    DVT (deep vein thrombosis) in pregnancy    Gastroenteritis 03/27/2023   GERD (gastroesophageal reflux disease)    HTN (hypertension)    Hyperlipidemia    Obesity    Pneumonia 04/2018   RIGHT LOBE   Sleep apnea    needs to be retested - to get new cpap   Past Surgical History:  Procedure Laterality Date   ABDOMINAL HYSTERECTOMY     AV FISTULA PLACEMENT Left 03/17/2022   Procedure: LEFT ARM ARTERIOVENOUS (AV) FISTULA CREATION;  Surgeon: Eliza Lonni RAMAN, MD;  Location: [facility] OR;  Service: Vascular;  Laterality: Left;   BIOPSY  01/12/2023  Procedure: BIOPSY;  Surgeon: Rollin Dover, MD;  Location: [facility];  Service: Gastroenterology;;   VASSIE STUDY  11/26/2020   Procedure: BUBBLE STUDY;  Surgeon: Lonni Slain, MD;  Location: [facility] ENDOSCOPY;  Service: Cardiovascular;;   CARDIOVERSION N/A 05/06/2020   Procedure: CARDIOVERSION;  Surgeon: Lonni Slain, MD;  Location: [facility] ENDOSCOPY;  Service: Cardiovascular;  Laterality: N/A;   CARDIOVERSION N/A 11/04/2020   Procedure: CARDIOVERSION;  Surgeon: Pietro Redell RAMAN, MD;  Location: [facility] ENDOSCOPY;  Service: Cardiovascular;  Laterality: N/A;   CARDIOVERSION N/A 02/01/2021   Procedure: CARDIOVERSION;  Surgeon: Cherrie Toribio SAUNDERS, MD;  Location: [facility] ENDOSCOPY;  Service: Cardiovascular;  Laterality: N/A;   CARDIOVERSION N/A 02/09/2021   Procedure: CARDIOVERSION;  Surgeon: Cherrie Toribio SAUNDERS, MD;  Location: [facility] ENDOSCOPY;  Service: Cardiovascular;  Laterality: N/A;   CARDIOVERSION N/A 04/19/2021   Procedure: CARDIOVERSION;  Surgeon: Okey Vina GAILS, MD;  Location: [facility] ENDOSCOPY;  Service: Cardiovascular;  Laterality: N/A;   CARDIOVERSION N/A 11/25/2021   Procedure:  CARDIOVERSION;  Surgeon: Cherrie Toribio SAUNDERS, MD;  Location: [facility] ENDOSCOPY;  Service: Cardiovascular;  Laterality: N/A;   CARDIOVERSION N/A 01/31/2022   Procedure: CARDIOVERSION;  Surgeon: Cherrie Toribio SAUNDERS, MD;  Location: [facility] ENDOSCOPY;  Service: Cardiovascular;  Laterality: N/A;   CARDIOVERSION N/A 07/23/2023   Procedure: CARDIOVERSION (CATH LAB);  Surgeon: Francyne Headland, MD;  Location: [facility] INVASIVE CV LAB;  Service: Cardiovascular;  Laterality: N/A;   CARDIOVERSION N/A 12/05/2023   Procedure: CARDIOVERSION;  Surgeon: Cherrie Toribio SAUNDERS, MD;  Location: [facility] INVASIVE CV LAB;  Service: Cardiovascular;  Laterality: N/A;   CATARACT EXTRACTION, BILATERAL     CHOLECYSTECTOMY     COLONOSCOPY WITH PROPOFOL  N/A 03/05/2015   Procedure: COLONOSCOPY WITH PROPOFOL ;  Surgeon: Dover Rollin, MD;  Location: [facility] ENDOSCOPY;  Service: Endoscopy;  Laterality: N/A;   DIAGNOSTIC LAPAROSCOPY     ESOPHAGOGASTRODUODENOSCOPY (EGD) WITH PROPOFOL  N/A 01/12/2023   Procedure: ESOPHAGOGASTRODUODENOSCOPY (EGD) WITH PROPOFOL ;  Surgeon: Rollin Dover, MD;  Location: [facility] ENDOSCOPY;  Service: Gastroenterology;  Laterality: N/A;   FISTULA SUPERFICIALIZATION Left 03/17/2022   Procedure: FISTULA SUPERFICIALIZATION -LEFT;  Surgeon: Eliza Lonni RAMAN, MD;  Location: [facility] OR;  Service: Vascular;  Laterality: Left;   ingrown hallux Left    IR FLUORO GUIDE CV LINE RIGHT  03/14/2022   IR US  GUIDE VASC ACCESS RIGHT  03/14/2022   KNEE SURGERY     LEFT HEART CATH AND CORONARY ANGIOGRAPHY N/A 12/31/2017   Procedure: LEFT HEART CATH AND CORONARY ANGIOGRAPHY;  Surgeon: Levern Hutching, MD;  Location: [facility] INVASIVE CV LAB;  Service: Cardiovascular;  Laterality: N/A;   MAZE N/A 12/29/2020   Procedure: MAZE;  Surgeon: Kerrin Elspeth BROCKS, MD;  Location: [facility] OR;  Service: Open Heart Surgery;  Laterality: N/A;   MITRAL VALVE REPAIR N/A 12/29/2020   Procedure: MITRAL VALVE REPAIR USING CARBOMEDICS ANNULOFLEX RING SIZE 30;  Surgeon: Kerrin Elspeth BROCKS, MD;   Location: [facility] OR;  Service: Open Heart Surgery;  Laterality: N/A;   REVISON OF ARTERIOVENOUS FISTULA Left 01/05/2023   Procedure: REVISION OF LEFT ARTERIOVENOUS FISTULA WITH INTERPOSITION PTFE GRAFT;  Surgeon: Eliza Lonni RAMAN, MD;  Location: [facility] OR;  Service: Vascular;  Laterality: Left;  ELIQUIS  AND HOLD 48 X HOURS   RIGHT HEART CATH N/A 12/22/2020   Procedure: RIGHT HEART CATH;  Surgeon: Rolan Ezra RAMAN, MD;  Location: [facility] INVASIVE CV LAB;  Service: Cardiovascular;  Laterality: N/A;   RIGHT HEART CATH N/A 01/31/2021   Procedure: RIGHT HEART CATH;  Surgeon: Cherrie Toribio SAUNDERS, MD;  Location: [facility]  INVASIVE CV LAB;  Service: Cardiovascular;  Laterality: N/A;   RIGHT HEART CATH N/A 07/29/2021   Procedure: RIGHT HEART CATH;  Surgeon: Cherrie Toribio SAUNDERS, MD;  Location: [facility]VASIVE CV LAB;  Service: Cardiovascular;  Laterality: N/A;   RIGHT HEART CATH N/A 11/24/2021   Procedure: RIGHT HEART CATH;  Surgeon: Cherrie Toribio SAUNDERS, MD;  Location: [facility]VASIVE CV LAB;  Service: Cardiovascular;  Laterality: N/A;   RIGHT HEART CATH N/A 02/14/2022   Procedure: RIGHT HEART CATH;  Surgeon: Cherrie Toribio SAUNDERS, MD;  Location: [facility] LAB;  Service: Cardiovascular;  Laterality: N/A;   RIGHT HEART CATH N/A 02/22/2022   Procedure: RIGHT HEART CATH;  Surgeon: Rolan Ezra RAMAN, MD;  Location: [facility] INVASIVE CV LAB;  Service: Cardiovascular;  Laterality: N/A;   TEE WITHOUT CARDIOVERSION N/A 11/26/2020   Procedure: TRANSESOPHAGEAL ECHOCARDIOGRAM (TEE);  Surgeon: Lonni Slain, MD;  Location: [facility] ENDOSCOPY;  Service: Cardiovascular;  Laterality: N/A;   TEE WITHOUT CARDIOVERSION N/A 12/29/2020   Procedure: TRANSESOPHAGEAL ECHOCARDIOGRAM (TEE);  Surgeon: Kerrin Elspeth BROCKS, MD;  Location: [name] OR;  Service: Open Heart Surgery;  Laterality: N/A;   TEE WITHOUT CARDIOVERSION N/A 01/20/2022   Procedure: TRANSESOPHAGEAL ECHOCARDIOGRAM (TEE);  Surgeon: Cherrie Toribio SAUNDERS, MD;  Location: [facility] ENDOSCOPY;  Service:  Cardiovascular;  Laterality: N/A;   TRICUSPID VALVE REPLACEMENT N/A 12/29/2020   Procedure: 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 RING SIZE 34;  Surgeon: Kerrin Elspeth BROCKS, MD;  Location: [facility] OR;  Service: Open Heart Surgery;  Laterality: N/A;    Current Outpatient Medications  Medication Sig Dispense Refill   acetaminophen  (TYLENOL ) 325 MG tablet Take 2 tablets (650 mg total) by mouth every 6 (six) hours as needed for mild pain or moderate pain. (Patient taking differently: Take 650 mg by mouth as needed for mild pain (pain score 1-3) or moderate pain (pain score 4-6).)     albuterol  (PROVENTIL  HFA) 108 (90 Base) MCG/ACT inhaler Inhale 1-2 puffs into the lungs every 6 (six) hours as needed for wheezing or shortness of breath. 18 g 2   amiodarone  (PACERONE ) 200 MG tablet Take 1 tablet (200 mg total) by mouth daily. 30 tablet 6   Blood Glucose Monitoring Suppl (ACCU-CHEK GUIDE) w/Device KIT Use to check blood sugar 3 times daily. 1 kit 0   busPIRone  (BUSPAR ) 5 MG tablet TAKE ONE TABLET BY MOUTH TWICE DAILY @9AM -5PM 180 tablet 1   diphenhydrAMINE  (BENADRYL ) 25 MG tablet Take 25 mg by mouth every 6 (six) hours as needed for itching. (Patient taking differently: Take 25 mg by mouth as needed for itching.)     DULoxetine  (CYMBALTA ) 60 MG capsule Take 1 capsule (60 mg total) by mouth daily. 180 capsule 11   ELIQUIS  5 MG TABS tablet TAKE ONE TABLET (5MG  TOTAL) BY MOUTH TWICE DAILY @9AM -5PM 180 tablet 11   EPINEPHrine  0.3 mg/0.3 mL IJ SOAJ injection Inject 0.3 mg into the muscle as needed for anaphylaxis. 1 each 1   fluticasone  (FLONASE  ALLERGY RELIEF) 50 MCG/ACT nasal spray Place 2 sprays into both nostrils daily as needed for allergies. (Patient taking differently: Place 2 sprays into both nostrils as needed for allergies.)     glucose blood (ACCU-CHEK GUIDE TEST) test strip Use to check blood sugar 3 times daily. 100 each 6   insulin  glargine (LANTUS  SOLOSTAR) 100 UNIT/ML Solostar Pen  Inject 10 Units into the skin daily. 15 mL 3   insulin  lispro (HUMALOG  KWIKPEN) 100 UNIT/ML KwikPen For blood sugars 0-150 give 0 units of  insulin , 151-200 give 2 units of insulin , 201-250 give 4 units, 251-300 give 6 units, 301-350 give 8 units, 351-400 give 10 units,> 400 give 12 units and call M.D. 15 mL 2   lidocaine -prilocaine  (EMLA ) cream Apply 1 Application topically Every Tuesday,Thursday,and Saturday with dialysis.     loratadine  (CLARITIN ) 10 MG tablet Take 1 tablet (10 mg total) by mouth every morning. 30 tablet 0   metoCLOPramide  (REGLAN ) 5 MG tablet Take 1 tablet (5 mg total) by mouth every 8 (eight) hours as needed for nausea. 30 tablet 0   metoprolol  tartrate (LOPRESSOR ) 25 MG tablet Take 1 tablet (25 mg total) by mouth 2 (two) times daily. 180 tablet 3   midodrine  (PROAMATINE ) 10 MG tablet Take 1 tablet (10 mg total) by mouth See admin instructions. Take 10mg  (1 tablet) by mouth on Tuesday's, Thursday's, and Saturday's.     Multiple Vitamins-Minerals (MULTIVITAMIN WOMEN PO) Take 1 tablet by mouth daily.     nitroGLYCERIN  (NITROSTAT ) 0.4 MG SL tablet Place 1 tablet (0.4 mg total) under the tongue every 5 (five) minutes x 3 doses as needed for chest pain. 25 tablet 12   pantoprazole  (PROTONIX ) 40 MG tablet Take 1 tablet (40 mg total) by mouth daily. 90 tablet 0   polyethylene glycol powder (MIRALAX ) 17 GM/SCOOP powder Take 17 g by mouth daily as needed (constipation.). (Patient taking differently: Take 17 g by mouth as needed (constipation.).)     rosuvastatin  (CRESTOR ) 10 MG tablet Take 10 mg by mouth at bedtime.     Accu-Chek Softclix Lancets lancets Use to check blood sugar 3 times daily. 100 each 6   No current facility-administered medications for this encounter.    Allergies  Allergen Reactions   Bee Pollen Anaphylaxis and Other (See Comments)    UNCONFIRMED, but IS allergic to bee VENOM   Bee Venom Anaphylaxis   Sulfa Antibiotics Itching and Rash   Chlorhexidine       Unknown rxn.   Iodinated Contrast Media Nausea And Vomiting   ROS- All systems are reviewed and negative except as per the HPI above  Physical Exam: Vitals:   02/29/24 1010  BP: 100/66  Pulse: (!) 108  Weight: 130.3 kg  Height: 5' 7 (1.702 m)     Wt Readings from Last 3 Encounters:  02/29/24 130.3 kg  02/01/24 125.2 kg  01/25/24 125.6 kg    Labs: Lab Results  Component Value Date   NA 138 02/01/2024   K 3.6 02/01/2024   CL 94 (L) 02/01/2024   CO2 26 02/01/2024   GLUCOSE 165 (H) 02/01/2024   BUN 29 (H) 02/01/2024   CREATININE 7.54 (H) 02/01/2024   CALCIUM  9.7 02/01/2024   PHOS 5.5 (H) 12/27/2023   MG 2.1 02/01/2024   Lab Results  Component Value Date   INR 1.9 (H) 06/18/2023   Lab Results  Component Value Date   CHOL 151 12/26/2023   HDL 56 12/26/2023   LDLCALC 80 12/26/2023   TRIG 75 12/26/2023   GEN- The patient is well appearing, alert and oriented x 3 today.   Neck - no JVD or carotid bruit noted Lungs- Clear to ausculation bilaterally, normal work of breathing Heart- Irregular tachycardic rate and rhythm, no murmurs, rubs or gallops, PMI not laterally displaced Extremities- no clubbing, cyanosis, or edema Skin - no rash or ecchymosis noted   EKG-  Vent. rate 108 BPM PR interval 290 ms QRS duration 88 ms QT/QTcB 390/522 ms P-R-T axes 16 146 119 Atrial flutter  with rapid ventricular response with Premature supraventricular complexes Possible Right ventricular hypertrophy Nonspecific T wave abnormality Prolonged QT Abnormal ECG When compared with ECG of 01-Feb-2024 13:09, PREVIOUS ECG IS PRESENT Confirmed by Terra Pac ([id number]) on 02/29/2024 10:49:54 AM Referred by: ROSALINE BOHR Confirmed   Assessment and Plan:  1. Persistent atrial fib/flutter  Has had afib for 10+ years  Has failed Tikosyn  and amiodarone  historically Hx Maze procedure and multiple cardioversions   She appears to be in atrial flutter with RVR. We discussed repeat  cardioversion and AV nodal ablation. We discussed that currently there are no other options for AAD therapy at this time. After discussion, patient is in agreement with cardioversion to try to feel better and would also like to speak with EP to discuss AV nodal ablation. Patient has history of multiple cardioversion procedures. We discussed the procedure cardioversion to try to convert to NSR. We discussed the risks vs benefits of this procedure and how ultimately we cannot predict whether a patient will have early return of arrhythmia post procedure. After discussion, the patient wishes to proceed with cardioversion. Labs drawn today.   Informed Consent   Shared Decision Making/Informed Consent The risks (stroke, cardiac arrhythmias rarely resulting in the need for a temporary or permanent pacemaker, skin irritation or burns and complications associated with conscious sedation including aspiration, arrhythmia, respiratory failure and death), benefits (restoration of normal sinus rhythm) and alternatives of a direct current cardioversion were explained in detail to [name] and she agrees to proceed.       High risk medication monitoring (ICD10: [id number]) Patient requires ongoing monitoring for anti-arrhythmic medication which has the potential to cause life threatening arrhythmias or AV block. Qtc stable. Decrease amiodarone  to 200 mg once daily.   2. Chronic systolic HF She has f/u with Dr. Lonni 08/30/23.   3. ESRD - HD - Tu/Th/Sat schedule Defer fluid mgmt to Nephrology  4. Secondary hypercoagulable state CHADS2VASC score of 4 No missed doses of Eliquis  5 mg BID.    Follow up after DCCV with Dr. Cindie to discuss AV nodal ablation.    Dorn Terra, PA-C Afib Clinic [facility] [address] [place], [place] [address] [phone] 10/20/22 10:25 am

## 2024-03-01 DIAGNOSIS — Z992 Dependence on renal dialysis: Secondary | ICD-10-CM | POA: Diagnosis not present

## 2024-03-01 DIAGNOSIS — N2581 Secondary hyperparathyroidism of renal origin: Secondary | ICD-10-CM | POA: Diagnosis not present

## 2024-03-01 DIAGNOSIS — D508 Other iron deficiency anemias: Secondary | ICD-10-CM | POA: Diagnosis not present

## 2024-03-01 DIAGNOSIS — E1129 Type 2 diabetes mellitus with other diabetic kidney complication: Secondary | ICD-10-CM | POA: Diagnosis not present

## 2024-03-01 DIAGNOSIS — D689 Coagulation defect, unspecified: Secondary | ICD-10-CM | POA: Diagnosis not present

## 2024-03-01 DIAGNOSIS — D631 Anemia in chronic kidney disease: Secondary | ICD-10-CM | POA: Diagnosis not present

## 2024-03-01 DIAGNOSIS — N186 End stage renal disease: Secondary | ICD-10-CM | POA: Diagnosis not present

## 2024-03-03 ENCOUNTER — Other Ambulatory Visit: Payer: Self-pay

## 2024-03-03 ENCOUNTER — Ambulatory Visit: Payer: Self-pay

## 2024-03-03 ENCOUNTER — Ambulatory Visit (HOSPITAL_COMMUNITY): Payer: Self-pay | Admitting: Internal Medicine

## 2024-03-03 ENCOUNTER — Emergency Department (HOSPITAL_COMMUNITY)

## 2024-03-03 ENCOUNTER — Inpatient Hospital Stay (HOSPITAL_COMMUNITY)
Admission: EM | Admit: 2024-03-03 | Discharge: 2024-03-05 | DRG: 308 | Disposition: A | Discharge status: Home Health | Attending: Internal Medicine | Admitting: Internal Medicine

## 2024-03-03 DIAGNOSIS — Z794 Long term (current) use of insulin: Secondary | ICD-10-CM

## 2024-03-03 DIAGNOSIS — Z91030 Bee allergy status: Secondary | ICD-10-CM

## 2024-03-03 DIAGNOSIS — J9601 Acute respiratory failure with hypoxia: Secondary | ICD-10-CM | POA: Diagnosis not present

## 2024-03-03 DIAGNOSIS — I9589 Other hypotension: Secondary | ICD-10-CM | POA: Diagnosis present

## 2024-03-03 DIAGNOSIS — I959 Hypotension, unspecified: Secondary | ICD-10-CM | POA: Diagnosis not present

## 2024-03-03 DIAGNOSIS — I428 Other cardiomyopathies: Secondary | ICD-10-CM | POA: Diagnosis not present

## 2024-03-03 DIAGNOSIS — I4811 Longstanding persistent atrial fibrillation: Secondary | ICD-10-CM | POA: Diagnosis present

## 2024-03-03 DIAGNOSIS — I499 Cardiac arrhythmia, unspecified: Secondary | ICD-10-CM | POA: Diagnosis not present

## 2024-03-03 DIAGNOSIS — D631 Anemia in chronic kidney disease: Secondary | ICD-10-CM | POA: Diagnosis present

## 2024-03-03 DIAGNOSIS — J309 Allergic rhinitis, unspecified: Secondary | ICD-10-CM | POA: Diagnosis present

## 2024-03-03 DIAGNOSIS — I132 Hypertensive heart and chronic kidney disease with heart failure and with stage 5 chronic kidney disease, or end stage renal disease: Secondary | ICD-10-CM | POA: Diagnosis present

## 2024-03-03 DIAGNOSIS — M1990 Unspecified osteoarthritis, unspecified site: Secondary | ICD-10-CM | POA: Diagnosis not present

## 2024-03-03 DIAGNOSIS — I4819 Other persistent atrial fibrillation: Secondary | ICD-10-CM

## 2024-03-03 DIAGNOSIS — M940 Chondrocostal junction syndrome [Tietze]: Secondary | ICD-10-CM | POA: Diagnosis not present

## 2024-03-03 DIAGNOSIS — E785 Hyperlipidemia, unspecified: Secondary | ICD-10-CM | POA: Diagnosis present

## 2024-03-03 DIAGNOSIS — Z6841 Body Mass Index (BMI) 40.0 and over, adult: Secondary | ICD-10-CM | POA: Diagnosis not present

## 2024-03-03 DIAGNOSIS — R0789 Other chest pain: Secondary | ICD-10-CM | POA: Diagnosis not present

## 2024-03-03 DIAGNOSIS — I200 Unstable angina: Secondary | ICD-10-CM | POA: Diagnosis not present

## 2024-03-03 DIAGNOSIS — R0600 Dyspnea, unspecified: Secondary | ICD-10-CM | POA: Diagnosis not present

## 2024-03-03 DIAGNOSIS — I495 Sick sinus syndrome: Secondary | ICD-10-CM | POA: Diagnosis present

## 2024-03-03 DIAGNOSIS — Z888 Allergy status to other drugs, medicaments and biological substances status: Secondary | ICD-10-CM

## 2024-03-03 DIAGNOSIS — I2510 Atherosclerotic heart disease of native coronary artery without angina pectoris: Secondary | ICD-10-CM | POA: Diagnosis present

## 2024-03-03 DIAGNOSIS — Z9842 Cataract extraction status, left eye: Secondary | ICD-10-CM

## 2024-03-03 DIAGNOSIS — R079 Chest pain, unspecified: Secondary | ICD-10-CM | POA: Diagnosis present

## 2024-03-03 DIAGNOSIS — Z882 Allergy status to sulfonamides status: Secondary | ICD-10-CM

## 2024-03-03 DIAGNOSIS — I4891 Unspecified atrial fibrillation: Secondary | ICD-10-CM | POA: Diagnosis not present

## 2024-03-03 DIAGNOSIS — Z9841 Cataract extraction status, right eye: Secondary | ICD-10-CM

## 2024-03-03 DIAGNOSIS — R072 Precordial pain: Secondary | ICD-10-CM | POA: Diagnosis not present

## 2024-03-03 DIAGNOSIS — G4733 Obstructive sleep apnea (adult) (pediatric): Secondary | ICD-10-CM | POA: Diagnosis present

## 2024-03-03 DIAGNOSIS — E1122 Type 2 diabetes mellitus with diabetic chronic kidney disease: Secondary | ICD-10-CM | POA: Diagnosis present

## 2024-03-03 DIAGNOSIS — Z8249 Family history of ischemic heart disease and other diseases of the circulatory system: Secondary | ICD-10-CM | POA: Diagnosis not present

## 2024-03-03 DIAGNOSIS — Z91041 Radiographic dye allergy status: Secondary | ICD-10-CM

## 2024-03-03 DIAGNOSIS — N2581 Secondary hyperparathyroidism of renal origin: Secondary | ICD-10-CM | POA: Diagnosis present

## 2024-03-03 DIAGNOSIS — Z992 Dependence on renal dialysis: Secondary | ICD-10-CM | POA: Diagnosis not present

## 2024-03-03 DIAGNOSIS — I482 Chronic atrial fibrillation, unspecified: Secondary | ICD-10-CM

## 2024-03-03 DIAGNOSIS — J4489 Other specified chronic obstructive pulmonary disease: Secondary | ICD-10-CM | POA: Diagnosis present

## 2024-03-03 DIAGNOSIS — Z8673 Personal history of transient ischemic attack (TIA), and cerebral infarction without residual deficits: Secondary | ICD-10-CM

## 2024-03-03 DIAGNOSIS — K219 Gastro-esophageal reflux disease without esophagitis: Secondary | ICD-10-CM | POA: Diagnosis present

## 2024-03-03 DIAGNOSIS — N186 End stage renal disease: Secondary | ICD-10-CM | POA: Diagnosis present

## 2024-03-03 DIAGNOSIS — Z538 Procedure and treatment not carried out for other reasons: Secondary | ICD-10-CM | POA: Diagnosis not present

## 2024-03-03 DIAGNOSIS — Z8616 Personal history of COVID-19: Secondary | ICD-10-CM | POA: Diagnosis not present

## 2024-03-03 DIAGNOSIS — Z79899 Other long term (current) drug therapy: Secondary | ICD-10-CM | POA: Diagnosis not present

## 2024-03-03 DIAGNOSIS — Z7901 Long term (current) use of anticoagulants: Secondary | ICD-10-CM

## 2024-03-03 DIAGNOSIS — I517 Cardiomegaly: Secondary | ICD-10-CM | POA: Diagnosis not present

## 2024-03-03 DIAGNOSIS — R0602 Shortness of breath: Secondary | ICD-10-CM | POA: Diagnosis not present

## 2024-03-03 DIAGNOSIS — N250 Renal osteodystrophy: Secondary | ICD-10-CM | POA: Diagnosis not present

## 2024-03-03 DIAGNOSIS — I5042 Chronic combined systolic (congestive) and diastolic (congestive) heart failure: Secondary | ICD-10-CM | POA: Diagnosis present

## 2024-03-03 DIAGNOSIS — Z90710 Acquired absence of both cervix and uterus: Secondary | ICD-10-CM

## 2024-03-03 DIAGNOSIS — Z9049 Acquired absence of other specified parts of digestive tract: Secondary | ICD-10-CM

## 2024-03-03 DIAGNOSIS — I081 Rheumatic disorders of both mitral and tricuspid valves: Secondary | ICD-10-CM | POA: Diagnosis not present

## 2024-03-03 LAB — CBC
HCT: 32.8 % — ABNORMAL LOW (ref 36.0–46.0)
Hemoglobin: 10.3 g/dL — ABNORMAL LOW (ref 12.0–15.0)
MCH: 31.8 pg (ref 26.0–34.0)
MCHC: 31.4 g/dL (ref 30.0–36.0)
MCV: 101.2 fL — ABNORMAL HIGH (ref 80.0–100.0)
Platelets: 158 K/uL (ref 150–400)
RBC: 3.24 MIL/uL — ABNORMAL LOW (ref 3.87–5.11)
RDW: 15.8 % — ABNORMAL HIGH (ref 11.5–15.5)
WBC: 6 K/uL (ref 4.0–10.5)
nRBC: 0 % (ref 0.0–0.2)

## 2024-03-03 LAB — COMPREHENSIVE METABOLIC PANEL WITH GFR
ALT: 10 U/L (ref 0–44)
AST: 16 U/L (ref 15–41)
Albumin: 3.6 g/dL (ref 3.5–5.0)
Alkaline Phosphatase: 93 U/L (ref 38–126)
Anion gap: 17 — ABNORMAL HIGH (ref 5–15)
BUN: 29 mg/dL — ABNORMAL HIGH (ref 8–23)
CO2: 27 mmol/L (ref 22–32)
Calcium: 9.7 mg/dL (ref 8.9–10.3)
Chloride: 90 mmol/L — ABNORMAL LOW (ref 98–111)
Creatinine, Ser: 8.98 mg/dL — ABNORMAL HIGH (ref 0.44–1.00)
GFR, Estimated: 4 mL/min — ABNORMAL LOW (ref >60)
Glucose, Bld: 116 mg/dL — ABNORMAL HIGH (ref 70–99)
Potassium: 3.7 mmol/L (ref 3.5–5.1)
Sodium: 134 mmol/L — ABNORMAL LOW (ref 135–145)
Total Bilirubin: 0.9 mg/dL (ref 0.0–1.2)
Total Protein: 7.8 g/dL (ref 6.5–8.1)

## 2024-03-03 LAB — TROPONIN I (HIGH SENSITIVITY)
Troponin I (High Sensitivity): 7 ng/L (ref <18)
Troponin I (High Sensitivity): 8 ng/L (ref <18)
Troponin I (High Sensitivity): 8 ng/L (ref <18)

## 2024-03-03 LAB — BRAIN NATRIURETIC PEPTIDE: B Natriuretic Peptide: 253.5 pg/mL — ABNORMAL HIGH (ref 0.0–100.0)

## 2024-03-03 MED ORDER — ASPIRIN 325 MG PO TBEC
325.0000 mg | DELAYED_RELEASE_TABLET | Freq: Once | ORAL | Status: AC
  Administered 2024-03-03: 325 mg via ORAL
  Filled 2024-03-03: qty 1

## 2024-03-03 NOTE — Telephone Encounter (Signed)
 Will forward to provider

## 2024-03-03 NOTE — H&P (Signed)
 History and Physical    [name] FMW:[id number] DOB: May 15, 1956 DOA: 03/03/2024  PCP: Leslie Rosaline SQUIBB, NP  Patient coming from: home  I have personally briefly reviewed patient's old medical records in [facility]  Chief Complaint: chest pain   HPI: [name] is a 68 y.o. 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 Patient describes pain as sharp. Patient of note was seen by cardiologist on 7/11 and note noted to have complaint of chest pain or palpitations per notes. However she was noted to be in Afib with rvr for which she was then slated for elective cardioversion on 7/16. Patient currently notes current symptoms of chest pain and palpitations usually occur with in setting of uncontrolled Afib. Although she is scheduled for cardioversion she notes chest pain and sob getting worse so she presented to ED for evaluation.She notes no fever/cough/n/v/ abdominal pain.   ED Course:  Vitals afeb, bp 99/73, hr 47 rr 10 Wbc 6, hgb 10.2, MC 101.2,  plt 158,  CE8 BNP 253.5 Na 134, K 3.7, Cl 90 , Glu 116, cr 8.98,  Trop 7  ASA325  Cxr IMPRESSION: Stable cardiomegaly without evidence of acute cardiopulmonary process. EKG: Afib with rvr 102, RVH, prolonnged QT 531 Review of Systems: As per HPI otherwise 10 point review of systems negative.   Past Medical History:  Diagnosis Date   Acute on chronic systolic CHF (congestive heart failure) (HCC) 12/21/2020   Allergic rhinitis    Anemia    Arthritis    Asthma    Chronic diastolic CHF (congestive heart failure) (HCC)    Chronic kidney disease    Dialysis Tu/TH/Sa   COPD (chronic  obstructive pulmonary disease) (HCC)    COVID-19 03/28/2023   Depression    DM (diabetes mellitus) (HCC)    DVT (deep vein thrombosis) in pregnancy    Gastroenteritis 03/27/2023   GERD (gastroesophageal reflux disease)    HTN (hypertension)    Hyperlipidemia    Obesity    Pneumonia 04/2018   RIGHT LOBE   Sleep apnea    needs to be retested - to get new cpap    Past Surgical History:  Procedure Laterality Date   ABDOMINAL HYSTERECTOMY     AV FISTULA PLACEMENT Left 03/17/2022   Procedure: LEFT ARM ARTERIOVENOUS (AV) FISTULA CREATION;  Surgeon: Eliza Lonni RAMAN, MD;  Location: [facility] OR;  Service: Vascular;  Laterality: Left;   BIOPSY  01/12/2023   Procedure: BIOPSY;  Surgeon: Rollin Dover, MD;  Location: [facility];  Service: Gastroenterology;;   VASSIE STUDY  11/26/2020   Procedure: BUBBLE STUDY;  Surgeon: Lonni Slain, MD;  Location: [facility] ENDOSCOPY;  Service: Cardiovascular;;   CARDIOVERSION N/A 05/06/2020   Procedure: CARDIOVERSION;  Surgeon: Lonni Slain, MD;  Location: [facility] ENDOSCOPY;  Service: Cardiovascular;  Laterality: N/A;   CARDIOVERSION N/A 11/04/2020   Procedure: CARDIOVERSION;  Surgeon: Pietro Redell RAMAN, MD;  Location: [facility] ENDOSCOPY;  Service: Cardiovascular;  Laterality: N/A;   CARDIOVERSION N/A 02/01/2021   Procedure: CARDIOVERSION;  Surgeon: Cherrie Toribio SAUNDERS, MD;  Location: [facility] ENDOSCOPY;  Service: Cardiovascular;  Laterality: N/A;   CARDIOVERSION N/A 02/09/2021   Procedure: CARDIOVERSION;  Surgeon: Cherrie Toribio SAUNDERS,  MD;  Location: [facility] ENDOSCOPY;  Service: Cardiovascular;  Laterality: N/A;   CARDIOVERSION N/A 04/19/2021   Procedure: CARDIOVERSION;  Surgeon: Okey Vina GAILS, MD;  Location: [facility] ENDOSCOPY;  Service: Cardiovascular;  Laterality: N/A;   CARDIOVERSION N/A 11/25/2021   Procedure: CARDIOVERSION;  Surgeon: Cherrie Toribio SAUNDERS, MD;  Location: [facility] ENDOSCOPY;  Service: Cardiovascular;  Laterality: N/A;   CARDIOVERSION N/A 01/31/2022   Procedure:  CARDIOVERSION;  Surgeon: Cherrie Toribio SAUNDERS, MD;  Location: [facility] ENDOSCOPY;  Service: Cardiovascular;  Laterality: N/A;   CARDIOVERSION N/A 07/23/2023   Procedure: CARDIOVERSION (CATH LAB);  Surgeon: Francyne Headland, MD;  Location: [facility] INVASIVE CV LAB;  Service: Cardiovascular;  Laterality: N/A;   CARDIOVERSION N/A 12/05/2023   Procedure: CARDIOVERSION;  Surgeon: Cherrie Toribio SAUNDERS, MD;  Location: [facility] INVASIVE CV LAB;  Service: Cardiovascular;  Laterality: N/A;   CATARACT EXTRACTION, BILATERAL     CHOLECYSTECTOMY     COLONOSCOPY WITH PROPOFOL  N/A 03/05/2015   Procedure: COLONOSCOPY WITH PROPOFOL ;  Surgeon: Belvie Just, MD;  Location: [facility] ENDOSCOPY;  Service: Endoscopy;  Laterality: N/A;   DIAGNOSTIC LAPAROSCOPY     ESOPHAGOGASTRODUODENOSCOPY (EGD) WITH PROPOFOL  N/A 01/12/2023   Procedure: ESOPHAGOGASTRODUODENOSCOPY (EGD) WITH PROPOFOL ;  Surgeon: Just Belvie, MD;  Location: [facility] ENDOSCOPY;  Service: Gastroenterology;  Laterality: N/A;   FISTULA SUPERFICIALIZATION Left 03/17/2022   Procedure: FISTULA SUPERFICIALIZATION -LEFT;  Surgeon: Eliza Lonni RAMAN, MD;  Location: [facility] OR;  Service: Vascular;  Laterality: Left;   ingrown hallux Left    IR FLUORO GUIDE CV LINE RIGHT  03/14/2022   IR US  GUIDE VASC ACCESS RIGHT  03/14/2022   KNEE SURGERY     LEFT HEART CATH AND CORONARY ANGIOGRAPHY N/A 12/31/2017   Procedure: LEFT HEART CATH AND CORONARY ANGIOGRAPHY;  Surgeon: Levern Hutching, MD;  Location: [facility] INVASIVE CV LAB;  Service: Cardiovascular;  Laterality: N/A;   MAZE N/A 12/29/2020   Procedure: MAZE;  Surgeon: Kerrin Elspeth BROCKS, MD;  Location: [facility] OR;  Service: Open Heart Surgery;  Laterality: N/A;   MITRAL VALVE REPAIR N/A 12/29/2020   Procedure: MITRAL VALVE REPAIR USING CARBOMEDICS ANNULOFLEX RING SIZE 30;  Surgeon: Kerrin Elspeth BROCKS, MD;  Location: [facility] OR;  Service: Open Heart Surgery;  Laterality: N/A;   REVISON OF ARTERIOVENOUS FISTULA Left 01/05/2023   Procedure: REVISION OF LEFT ARTERIOVENOUS  FISTULA WITH INTERPOSITION PTFE GRAFT;  Surgeon: Eliza Lonni RAMAN, MD;  Location: [facility] OR;  Service: Vascular;  Laterality: Left;  ELIQUIS  AND HOLD 48 X HOURS   RIGHT HEART CATH N/A 12/22/2020   Procedure: RIGHT HEART CATH;  Surgeon: Rolan Ezra RAMAN, MD;  Location: [facility] INVASIVE CV LAB;  Service: Cardiovascular;  Laterality: N/A;   RIGHT HEART CATH N/A 01/31/2021   Procedure: RIGHT HEART CATH;  Surgeon: Cherrie Toribio SAUNDERS, MD;  Location: [facility] INVASIVE CV LAB;  Service: Cardiovascular;  Laterality: N/A;   RIGHT HEART CATH N/A 07/29/2021   Procedure: RIGHT HEART CATH;  Surgeon: Cherrie Toribio SAUNDERS, MD;  Location: [facility] INVASIVE CV LAB;  Service: Cardiovascular;  Laterality: N/A;   RIGHT HEART CATH N/A 11/24/2021   Procedure: RIGHT HEART CATH;  Surgeon: Cherrie Toribio SAUNDERS, MD;  Location: [facility] INVASIVE CV LAB;  Service: Cardiovascular;  Laterality: N/A;   RIGHT HEART CATH N/A 02/14/2022   Procedure: RIGHT HEART CATH;  Surgeon: Cherrie Toribio SAUNDERS, MD;  Location: [facility] INVASIVE CV LAB;  Service: Cardiovascular;  Laterality: N/A;   RIGHT HEART CATH N/A 02/22/2022   Procedure: RIGHT HEART CATH;  Surgeon: Rolan Ezra RAMAN, MD;  Location: [facility] INVASIVE  CV LAB;  Service: Cardiovascular;  Laterality: N/A;   TEE WITHOUT CARDIOVERSION N/A 11/26/2020   Procedure: TRANSESOPHAGEAL ECHOCARDIOGRAM (TEE);  Surgeon: Lonni Slain, MD;  Location: [facility] ENDOSCOPY;  Service: Cardiovascular;  Laterality: N/A;   TEE WITHOUT CARDIOVERSION N/A 12/29/2020   Procedure: TRANSESOPHAGEAL ECHOCARDIOGRAM (TEE);  Surgeon: Kerrin Elspeth BROCKS, MD;  Location: [facility] OR;  Service: Open Heart Surgery;  Laterality: N/A;   TEE WITHOUT CARDIOVERSION N/A 01/20/2022   Procedure: TRANSESOPHAGEAL ECHOCARDIOGRAM (TEE);  Surgeon: Cherrie Toribio SAUNDERS, MD;  Location: [facility] ENDOSCOPY;  Service: Cardiovascular;  Laterality: N/A;   TRICUSPID VALVE REPLACEMENT N/A 12/29/2020   Procedure: 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 RING SIZE 34;  Surgeon:  Kerrin Elspeth BROCKS, MD;  Location: [facility] OR;  Service: Open Heart Surgery;  Laterality: N/A;     reports that she has never smoked. She has never been exposed to tobacco smoke. She has never used smokeless tobacco. She reports that she does not drink alcohol  and does not use drugs.  Allergies  Allergen Reactions   Bee Pollen Anaphylaxis and Other (See Comments)    UNCONFIRMED, but IS allergic to bee VENOM   Bee Venom Anaphylaxis   Sulfa Antibiotics Itching and Rash   Chlorhexidine      Unknown rxn.   Iodinated Contrast Media Nausea And Vomiting    Family History  Problem Relation Age of Onset   Breast cancer Mother    Cancer Mother    Hypertension Mother    Cancer Father    Hypertension Sister    Hypertension Brother    CAD Other     Prior to Admission medications   Medication Sig Start Date End Date Taking? Authorizing Provider  Accu-Chek Softclix Lancets lancets Use to check blood sugar 3 times daily. 01/01/24   Newlin, Enobong, MD  acetaminophen  (TYLENOL ) 325 MG tablet Take 2 tablets (650 mg total) by mouth every 6 (six) hours as needed for mild pain or moderate pain. 12/07/21   Arrien, Mauricio Daniel, MD  albuterol  (PROVENTIL  HFA) 108 [phone] Base) MCG/ACT inhaler Inhale 1-2 puffs into the lungs every 6 (six) hours as needed for wheezing or shortness of breath. 10/13/22   Leslie Rosaline SQUIBB, NP  amiodarone  (PACERONE ) 200 MG tablet Take 1 tablet (200 mg total) by mouth daily. 11/23/23   Bensimhon, Toribio SAUNDERS, MD  AURYXIA  1 GM 210 MG(Fe) tablet Take 210 mg by mouth 3 (three) times daily.    [provider]  Blood Glucose Monitoring Suppl (ACCU-CHEK GUIDE) w/Device KIT Use to check blood sugar 3 times daily. 01/01/24   Newlin, Enobong, MD  busPIRone  (BUSPAR ) 5 MG tablet TAKE ONE TABLET BY MOUTH TWICE DAILY @9AM -5PM 02/14/24   Leslie Rosaline SQUIBB, NP  diphenhydrAMINE  (BENADRYL ) 25 MG tablet Take 25 mg by mouth every 6 (six) hours as needed for itching. Patient taking differently:  Take 25 mg by mouth as needed for itching. 03/31/22   [provider]  DULoxetine  (CYMBALTA ) 60 MG capsule TAKE ONE CAPSULE (60MG  TOTAL) BY MOUTH DAILY AT 9AM 02/29/24   Leslie Rosaline SQUIBB, NP  ELIQUIS  5 MG TABS tablet TAKE ONE TABLET (5MG  TOTAL) BY MOUTH TWICE DAILY @9AM -5PM 09/06/23   Bensimhon, Toribio SAUNDERS, MD  EPINEPHrine  0.3 mg/0.3 mL IJ SOAJ injection Inject 0.3 mg into the muscle as needed for anaphylaxis. 06/25/20   Leslie Rosaline SQUIBB, NP  fluticasone  (FLONASE  ALLERGY RELIEF) 50 MCG/ACT nasal spray Place 2 sprays into both nostrils daily as needed for allergies. Patient taking differently: Place 2 sprays into  both nostrils as needed for allergies. 03/31/22   [provider]  glucose blood (ACCU-CHEK GUIDE TEST) test strip Use to check blood sugar 3 times daily. 01/01/24   Newlin, Enobong, MD  insulin  glargine (LANTUS  SOLOSTAR) 100 UNIT/ML Solostar Pen Inject 10 Units into the skin daily. 09/27/23   Leslie Rosaline SQUIBB, NP  insulin  lispro (HUMALOG  KWIKPEN) 100 UNIT/ML KwikPen For blood sugars 0-150 give 0 units of insulin , 151-200 give 2 units of insulin , 201-250 give 4 units, 251-300 give 6 units, 301-350 give 8 units, 351-400 give 10 units,> 400 give 12 units and call M.D. 09/27/23   Newlin, Enobong, MD  lidocaine -prilocaine  (EMLA ) cream Apply 1 Application topically Every Tuesday,Thursday,and Saturday with dialysis. 06/01/22   [provider]  loratadine  (CLARITIN ) 10 MG tablet Take 1 tablet (10 mg total) by mouth every morning. 02/08/24   Leslie Rosaline SQUIBB, NP  Methoxy PEG-Epoetin  Beta (MIRCERA IJ) 50 mcg. 01/29/24 01/27/25  [provider]  metoCLOPramide  (REGLAN ) 5 MG tablet Take 1 tablet (5 mg total) by mouth every 8 (eight) hours as needed for nausea. 07/10/23   Leslie Rosaline SQUIBB, NP  metoprolol  tartrate (LOPRESSOR ) 25 MG tablet Take 1 tablet (25 mg total) by mouth 2 (two) times daily. 01/25/24   Vannie Reche RAMAN, NP  midodrine  (PROAMATINE ) 10 MG tablet Take 1  tablet (10 mg total) by mouth See admin instructions. Take 10mg  (1 tablet) by mouth on Tuesday's, Thursday's, and Saturday's. 03/30/23   Rojelio Nest, DO  Multiple Vitamins-Minerals (MULTIVITAMIN WOMEN PO) Take 1 tablet by mouth daily.    [provider]  nitroGLYCERIN  (NITROSTAT ) 0.4 MG SL tablet Place 1 tablet (0.4 mg total) under the tongue every 5 (five) minutes x 3 doses as needed for chest pain. 11/08/23   Lonni Slain, MD  pantoprazole  (PROTONIX ) 40 MG tablet Take 1 tablet (40 mg total) by mouth daily. 04/24/23   Leslie Rosaline SQUIBB, NP  polyethylene glycol powder (MIRALAX ) 17 GM/SCOOP powder Take 17 g by mouth daily as needed (constipation.). Patient taking differently: Take 17 g by mouth as needed (constipation.). 06/26/22   [provider]  rosuvastatin  (CRESTOR ) 10 MG tablet Take 10 mg by mouth at bedtime. 02/28/24   [provider]    Physical Exam: Vitals:   03/03/24 1449 03/03/24 1831 03/03/24 1908 03/03/24 2242  BP: 110/61 99/73 96/74  (!) 108/57  Pulse: (!) 47 (!) 47 80 93  Resp: 18 10 16 20   Temp:  (!) 97.5 F (36.4 C)    TempSrc:  Oral    SpO2: 98% 99% 93% 94%    Constitutional: NAD, calm, comfortable Vitals:   03/03/24 1449 03/03/24 1831 03/03/24 1908 03/03/24 2242  BP: 110/61 99/73 96/74  (!) 108/57  Pulse: (!) 47 (!) 47 80 93  Resp: 18 10 16 20   Temp:  (!) 97.5 F (36.4 C)    TempSrc:  Oral    SpO2: 98% 99% 93% 94%   Eyes: PERRL, lids and conjunctivae normal ENMT: Mucous membranes are moist. Posterior pharynx clear of any exudate or lesions.Normal dentition.  Neck: normal, supple, no masses, no thyromegaly Respiratory: clear to auscultation bilaterally, no wheezing, no crackles. Normal respiratory effort. No accessory muscle use.  Cardiovascular: irregular, no murmurs / rubs / gallops. Trace -1 edema. , warm extremities Abdomen: no tenderness, no masses palpated. No hepatosplenomegaly. Bowel sounds positive.  Musculoskeletal: no  clubbing / cyanosis. No joint deformity upper and lower extremities. Good ROM, no contractures. Normal muscle tone.  Skin: no rashes, lesions,  ulcers. No induration Neurologic: CN grossly intact. Sensation intact,. Strength 5/5 in all 4.  Psychiatric: Normal judgment and insight. Alert and oriented x 3. Normal mood.    Labs on Admission: I have personally reviewed following labs and imaging studies  CBC: Recent Labs  Lab 02/29/24 1108 03/03/24 1651  WBC 6.2 6.0  HGB 10.1* 10.3*  HCT 31.9* 32.8*  MCV 97 101.2*  PLT 149* 158   Basic Metabolic Panel: Recent Labs  Lab 02/29/24 1108 03/03/24 1652  NA 137 134*  K 4.2 3.7  CL 93* 90*  CO2 32* 27  GLUCOSE 119* 116*  BUN 26 29*  CREATININE 6.93* 8.98*  CALCIUM  9.7 9.7   GFR: Estimated Creatinine Clearance: 8.6 mL/min (A) (by C-G formula based on SCr of 8.98 mg/dL (H)). Liver Function Tests: Recent Labs  Lab 03/03/24 1652  AST 16  ALT 10  ALKPHOS 93  BILITOT 0.9  PROT 7.8  ALBUMIN  3.6   No results for input(s): LIPASE, AMYLASE in the last 168 hours. No results for input(s): AMMONIA in the last 168 hours. Coagulation Profile: No results for input(s): INR, PROTIME in the last 168 hours. Cardiac Enzymes: No results for input(s): CKTOTAL, CKMB, CKMBINDEX, TROPONINI in the last 168 hours. BNP (last 3 results) Recent Labs    12/24/23 1004  PROBNP 8,609.0*   HbA1C: No results for input(s): HGBA1C in the last 72 hours. CBG: No results for input(s): GLUCAP in the last 168 hours. Lipid Profile: No results for input(s): CHOL, HDL, LDLCALC, TRIG, CHOLHDL, LDLDIRECT in the last 72 hours. Thyroid  Function Tests: No results for input(s): TSH, T4TOTAL, FREET4, T3FREE, THYROIDAB in the last 72 hours. Anemia Panel: No results for input(s): VITAMINB12, FOLATE, FERRITIN, TIBC, IRON , RETICCTPCT in the last 72 hours. Urine analysis:    Component Value Date/Time    COLORURINE AMBER (A) 11/07/2022 1447   APPEARANCEUR CLOUDY (A) 11/07/2022 1447   LABSPEC 1.015 11/07/2022 1447   PHURINE 5.0 11/07/2022 1447   GLUCOSEU NEGATIVE 11/07/2022 1447   HGBUR SMALL (A) 11/07/2022 1447   BILIRUBINUR small (A) 02/21/2023 1748   KETONESUR trace (5) (A) 02/21/2023 1748   KETONESUR NEGATIVE 11/07/2022 1447   PROTEINUR =100 (A) 02/21/2023 1748   PROTEINUR 100 (A) 11/07/2022 1447   UROBILINOGEN 0.2 02/21/2023 1748   UROBILINOGEN 1.0 10/26/2014 0249   NITRITE Negative 02/21/2023 1748   NITRITE NEGATIVE 11/07/2022 1447   LEUKOCYTESUR Negative 02/21/2023 1748   LEUKOCYTESUR TRACE (A) 11/07/2022 1447    Radiological Exams on Admission: DG Chest 2 View Result Date: 03/03/2024 CLINICAL DATA:  Worsening chest pain and shortness of breath. EXAM: CHEST - 2 VIEW COMPARISON:  Cardiac CT 12/26/2023. Chest radiographs 12/24/2023 and 02/01/2024. FINDINGS: Stable cardiomegaly post median sternotomy, dual valve replacement and left atrial appendage closure. The lungs are clear. No pleural effusion or pneumothorax. The bones appear unremarkable. IMPRESSION: Stable cardiomegaly without evidence of acute cardiopulmonary process. Electronically Signed   By: Elsie Perone M.D.   On: 03/03/2024 15:41    EKG: Independently reviewed. See above   Assessment/Plan  Persistent Atrial fibrillation -with associated sob and chest pain  - admit to cardiac tele -npo midnight for planned cardioversion  - continue on apixaban   -continue on metoprolol   -f/u on further cardiology recs in am    CHFpef  -well compensated   HTN  -currently lower bp  -continue with metoprolol  as BP tolerates  OSA -cpap at bedtime   DMII -iss/fs   ESRD on HD -TTS  - renal  consult   Morbid Obesity    DVT prophylaxis: on Eliquis  Code Status: full/ as discussed per patient wishes in event of cardiac arrest  Family Communication: none at bedside Disposition Plan: full/ as discussed per patient  wishes in event of cardiac arrest  Consults called: Cardiology Dr Gail Admission status: cardiac tele   Camila DELENA Ned MD [facility]   If 7PM-7AM, please contact night-coverage [email or web address] Password [facility]  03/03/2024, 11:45 PM

## 2024-03-03 NOTE — Consult Note (Signed)
 Cardiology Consultation   Patient ID: [name] MRN: [id number]; DOB: Sep 02, 1955  Admit date: 03/03/2024 Date of Consult: 03/03/2024  PCP:  Celestia Rosaline SQUIBB, NP   [facility] Providers Cardiologist:  Shelda Bruckner, MD  Electrophysiologist:  OLE ONEIDA HOLTS, MD       Patient Profile: [name] is a 68 y.o. 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  History of Present Illness: Ms. [name] endorsed substernal chest pain and palpitations that have started for the past week.  She states her symptoms are persistent, and occasionally she has palpitations.  She has a pulse ox, and notes fluctuating heart rates.  Has had similar symptoms before she is contributed to her atrial fibrillation.  She is scheduled to get a cardioversion on 03/05/2024, however, she states her symptoms are debilitating and today's symptoms prompted her ER admission.  She states she has had atrial fibrillation for the past 8 years.  She has had many cardioversions in the past, most recently in April.  She has never had a catheter ablation in the past.  Previously been on dofetilide  in 2022, however this did not keep her in normal sinus rhythm.  Stroke reduction she is on Eliquis , and states she has not missed any doses for at least the past month.  For rate control she is on metoprolol  tartrate.  Patient has had several admissions for atrial fibrillation with rapid ventricular rates as well as CHF exacerbations.  Sleep apnea, however she currently does not have a CPAP machine and is waiting on her updated sleep study.  Not very active.  She has a strong family history of atrial fibrillation and both of her siblings.   Recently had a workup for obstructive coronary artery disease with a coronary CT in May, 2025 which showed mild nonobstructive coronary artery disease of the OM1 vessel and mid RCA, CAD RADS score 2 with a CAC score of 58.  Recent echocardiogram in 10/15/2023 showed normal EF with a severely enlarged left atria, with RV function, moderate TR.   Vitals include terrible heart rate between 80-93 per minute, blood pressure 108/57, SpO2 99% on room air.  Notable labs include high-sensitivity troponin 7->8, BNP 253, white blood cell count 6, hemoglobin 10.3, platelet count 158k.  Past Medical History:  Diagnosis Date   Acute on chronic systolic CHF (congestive heart failure) (HCC) 12/21/2020   Allergic rhinitis    Anemia    Arthritis    Asthma    Chronic diastolic CHF (congestive heart failure) (HCC)    Chronic kidney disease    Dialysis Tu/TH/Sa   COPD (chronic obstructive pulmonary disease) (HCC)    COVID-19 03/28/2023   Depression    DM (diabetes mellitus) (HCC)    DVT (deep vein thrombosis) in pregnancy    Gastroenteritis 03/27/2023   GERD (gastroesophageal reflux disease)    HTN (hypertension)    Hyperlipidemia    Obesity    Pneumonia 04/2018   RIGHT LOBE   Sleep apnea    needs to be retested - to get new cpap    Past Surgical History:  Procedure Laterality Date   ABDOMINAL HYSTERECTOMY     AV FISTULA PLACEMENT Left 03/17/2022   Procedure: LEFT ARM ARTERIOVENOUS (AV) FISTULA CREATION;  Surgeon: Eliza Bruckner RAMAN, MD;  Location: [facility] OR;  Service: Vascular;  Laterality: Left;   BIOPSY  01/12/2023   Procedure: BIOPSY;  Surgeon: Rollin Dover, MD;  Location: [facility];  Service: Gastroenterology;;   VASSIE STUDY  11/26/2020   Procedure: BUBBLE STUDY;  Surgeon: Lonni Slain, MD;  Location: [facility] ENDOSCOPY;  Service: Cardiovascular;;   CARDIOVERSION N/A 05/06/2020   Procedure: CARDIOVERSION;  Surgeon: Lonni Slain, MD;  Location: [facility] ENDOSCOPY;  Service:  Cardiovascular;  Laterality: N/A;   CARDIOVERSION N/A 11/04/2020   Procedure: CARDIOVERSION;  Surgeon: Pietro Redell RAMAN, MD;  Location: [facility] ENDOSCOPY;  Service: Cardiovascular;  Laterality: N/A;   CARDIOVERSION N/A 02/01/2021   Procedure: CARDIOVERSION;  Surgeon: Cherrie Toribio SAUNDERS, MD;  Location: [facility] ENDOSCOPY;  Service: Cardiovascular;  Laterality: N/A;   CARDIOVERSION N/A 02/09/2021   Procedure: CARDIOVERSION;  Surgeon: Cherrie Toribio SAUNDERS, MD;  Location: [facility] ENDOSCOPY;  Service: Cardiovascular;  Laterality: N/A;   CARDIOVERSION N/A 04/19/2021   Procedure: CARDIOVERSION;  Surgeon: Okey Vina GAILS, MD;  Location: [facility] ENDOSCOPY;  Service: Cardiovascular;  Laterality: N/A;   CARDIOVERSION N/A 11/25/2021   Procedure: CARDIOVERSION;  Surgeon: Cherrie Toribio SAUNDERS, MD;  Location: [facility] ENDOSCOPY;  Service: Cardiovascular;  Laterality: N/A;   CARDIOVERSION N/A 01/31/2022   Procedure: CARDIOVERSION;  Surgeon: Cherrie Toribio SAUNDERS, MD;  Location: [facility] ENDOSCOPY;  Service: Cardiovascular;  Laterality: N/A;   CARDIOVERSION N/A 07/23/2023   Procedure: CARDIOVERSION (CATH LAB);  Surgeon: Francyne Headland, MD;  Location: [facility] INVASIVE CV LAB;  Service: Cardiovascular;  Laterality: N/A;   CARDIOVERSION N/A 12/05/2023   Procedure: CARDIOVERSION;  Surgeon: Cherrie Toribio SAUNDERS, MD;  Location: [facility] INVASIVE CV LAB;  Service: Cardiovascular;  Laterality: N/A;   CATARACT EXTRACTION, BILATERAL     CHOLECYSTECTOMY     COLONOSCOPY WITH PROPOFOL  N/A 03/05/2015   Procedure: COLONOSCOPY WITH PROPOFOL ;  Surgeon: Dover Rollin, MD;  Location: [facility] ENDOSCOPY;  Service: Endoscopy;  Laterality: N/A;   DIAGNOSTIC LAPAROSCOPY     ESOPHAGOGASTRODUODENOSCOPY (EGD) WITH PROPOFOL  N/A 01/12/2023   Procedure: ESOPHAGOGASTRODUODENOSCOPY (EGD) WITH PROPOFOL ;  Surgeon: Rollin Dover, MD;  Location: [facility] ENDOSCOPY;  Service: Gastroenterology;  Laterality: N/A;   FISTULA SUPERFICIALIZATION Left 03/17/2022   Procedure: FISTULA SUPERFICIALIZATION -LEFT;  Surgeon:  Eliza Lonni RAMAN, MD;  Location: [facility] OR;  Service: Vascular;  Laterality: Left;   ingrown hallux Left    IR FLUORO GUIDE CV LINE RIGHT  03/14/2022   IR US  GUIDE VASC ACCESS RIGHT  03/14/2022   KNEE SURGERY     LEFT HEART CATH AND CORONARY ANGIOGRAPHY N/A 12/31/2017   Procedure: LEFT HEART CATH AND CORONARY ANGIOGRAPHY;  Surgeon: Levern Hutching, MD;  Location: [facility] INVASIVE CV LAB;  Service: Cardiovascular;  Laterality: N/A;   MAZE N/A 12/29/2020   Procedure: MAZE;  Surgeon: Kerrin Elspeth BROCKS, MD;  Location: [facility] OR;  Service: Open Heart Surgery;  Laterality: N/A;   MITRAL VALVE REPAIR N/A 12/29/2020   Procedure: MITRAL VALVE REPAIR USING CARBOMEDICS ANNULOFLEX RING SIZE 30;  Surgeon: Kerrin Elspeth BROCKS, MD;  Location: [facility] OR;  Service: Open Heart Surgery;  Laterality: N/A;   REVISON OF ARTERIOVENOUS FISTULA Left 01/05/2023   Procedure: REVISION OF LEFT ARTERIOVENOUS FISTULA WITH INTERPOSITION PTFE GRAFT;  Surgeon: Eliza Lonni RAMAN, MD;  Location: [facility] OR;  Service: Vascular;  Laterality: Left;  ELIQUIS  AND HOLD 48 X HOURS   RIGHT HEART CATH N/A 12/22/2020   Procedure: RIGHT HEART CATH;  Surgeon: Rolan Ezra RAMAN, MD;  Location: [facility] INVASIVE CV LAB;  Service: Cardiovascular;  Laterality: N/A;   RIGHT HEART CATH N/A  01/31/2021   Procedure: RIGHT HEART CATH;  Surgeon: Cherrie Toribio SAUNDERS, MD;  Location: [facility] INVASIVE CV LAB;  Service: Cardiovascular;  Laterality: N/A;   RIGHT HEART CATH N/A 07/29/2021   Procedure: RIGHT HEART CATH;  Surgeon: Cherrie Toribio SAUNDERS, MD;  Location: [facility] INVASIVE CV LAB;  Service: Cardiovascular;  Laterality: N/A;   RIGHT HEART CATH N/A 11/24/2021   Procedure: RIGHT HEART CATH;  Surgeon: Cherrie Toribio SAUNDERS, MD;  Location: [facility] INVASIVE CV LAB;  Service: Cardiovascular;  Laterality: N/A;   RIGHT HEART CATH N/A 02/14/2022   Procedure: RIGHT HEART CATH;  Surgeon: Cherrie Toribio SAUNDERS, MD;  Location: [facility] INVASIVE CV LAB;  Service: Cardiovascular;  Laterality: N/A;   RIGHT HEART CATH  N/A 02/22/2022   Procedure: RIGHT HEART CATH;  Surgeon: Rolan Ezra RAMAN, MD;  Location: [facility] INVASIVE CV LAB;  Service: Cardiovascular;  Laterality: N/A;   TEE WITHOUT CARDIOVERSION N/A 11/26/2020   Procedure: TRANSESOPHAGEAL ECHOCARDIOGRAM (TEE);  Surgeon: Lonni Slain, MD;  Location: [facility] ENDOSCOPY;  Service: Cardiovascular;  Laterality: N/A;   TEE WITHOUT CARDIOVERSION N/A 12/29/2020   Procedure: TRANSESOPHAGEAL ECHOCARDIOGRAM (TEE);  Surgeon: Kerrin Elspeth BROCKS, MD;  Location: [facility] OR;  Service: Open Heart Surgery;  Laterality: N/A;   TEE WITHOUT CARDIOVERSION N/A 01/20/2022   Procedure: TRANSESOPHAGEAL ECHOCARDIOGRAM (TEE);  Surgeon: Cherrie Toribio SAUNDERS, MD;  Location: [facility] ENDOSCOPY;  Service: Cardiovascular;  Laterality: N/A;   TRICUSPID VALVE REPLACEMENT N/A 12/29/2020   Procedure: 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 RING SIZE 34;  Surgeon: Kerrin Elspeth BROCKS, MD;  Location: [facility] OR;  Service: Open Heart Surgery;  Laterality: N/A;     Home Medications:  Prior to Admission medications   Medication Sig Start Date End Date Taking? Authorizing Provider  Accu-Chek Softclix Lancets lancets Use to check blood sugar 3 times daily. 01/01/24   Newlin, Enobong, MD  acetaminophen  (TYLENOL ) 325 MG tablet Take 2 tablets (650 mg total) by mouth every 6 (six) hours as needed for mild pain or moderate pain. 12/07/21   Arrien, Mauricio Daniel, MD  albuterol  (PROVENTIL  HFA) 108 [phone] Base) MCG/ACT inhaler Inhale 1-2 puffs into the lungs every 6 (six) hours as needed for wheezing or shortness of breath. 10/13/22   Celestia Rosaline SQUIBB, NP  amiodarone  (PACERONE ) 200 MG tablet Take 1 tablet (200 mg total) by mouth daily. 11/23/23   Bensimhon, Toribio SAUNDERS, MD  AURYXIA  1 GM 210 MG(Fe) tablet Take 210 mg by mouth 3 (three) times daily.    [provider]  Blood Glucose Monitoring Suppl (ACCU-CHEK GUIDE) w/Device KIT Use to check blood sugar 3 times daily. 01/01/24   Newlin, Enobong, MD  busPIRone   (BUSPAR ) 5 MG tablet TAKE ONE TABLET BY MOUTH TWICE DAILY @9AM -5PM 02/14/24   Celestia Rosaline SQUIBB, NP  diphenhydrAMINE  (BENADRYL ) 25 MG tablet Take 25 mg by mouth every 6 (six) hours as needed for itching. Patient taking differently: Take 25 mg by mouth as needed for itching. 03/31/22   [provider]  DULoxetine  (CYMBALTA ) 60 MG capsule TAKE ONE CAPSULE (60MG  TOTAL) BY MOUTH DAILY AT 9AM 02/29/24   Celestia Rosaline SQUIBB, NP  ELIQUIS  5 MG TABS tablet TAKE ONE TABLET (5MG  TOTAL) BY MOUTH TWICE DAILY @9AM -5PM 09/06/23   Bensimhon, Toribio SAUNDERS, MD  EPINEPHrine  0.3 mg/0.3 mL IJ SOAJ injection Inject 0.3 mg into the muscle as needed for anaphylaxis. 06/25/20   Celestia Rosaline SQUIBB, NP  fluticasone  (FLONASE  ALLERGY RELIEF) 50 MCG/ACT nasal spray Place 2 sprays into both nostrils daily as needed for  allergies. Patient taking differently: Place 2 sprays into both nostrils as needed for allergies. 03/31/22   [provider]  glucose blood (ACCU-CHEK GUIDE TEST) test strip Use to check blood sugar 3 times daily. 01/01/24   Newlin, Enobong, MD  insulin  glargine (LANTUS  SOLOSTAR) 100 UNIT/ML Solostar Pen Inject 10 Units into the skin daily. 09/27/23   Celestia Rosaline SQUIBB, NP  insulin  lispro (HUMALOG  KWIKPEN) 100 UNIT/ML KwikPen For blood sugars 0-150 give 0 units of insulin , 151-200 give 2 units of insulin , 201-250 give 4 units, 251-300 give 6 units, 301-350 give 8 units, 351-400 give 10 units,> 400 give 12 units and call M.D. 09/27/23   Newlin, Enobong, MD  lidocaine -prilocaine  (EMLA ) cream Apply 1 Application topically Every Tuesday,Thursday,and Saturday with dialysis. 06/01/22   [provider]  loratadine  (CLARITIN ) 10 MG tablet Take 1 tablet (10 mg total) by mouth every morning. 02/08/24   Celestia Rosaline SQUIBB, NP  Methoxy PEG-Epoetin  Beta (MIRCERA IJ) 50 mcg. 01/29/24 01/27/25  [provider]  metoCLOPramide  (REGLAN ) 5 MG tablet Take 1 tablet (5 mg total) by mouth every 8 (eight) hours as  needed for nausea. 07/10/23   Celestia Rosaline SQUIBB, NP  metoprolol  tartrate (LOPRESSOR ) 25 MG tablet Take 1 tablet (25 mg total) by mouth 2 (two) times daily. 01/25/24   Vannie Reche RAMAN, NP  midodrine  (PROAMATINE ) 10 MG tablet Take 1 tablet (10 mg total) by mouth See admin instructions. Take 10mg  (1 tablet) by mouth on Tuesday's, Thursday's, and Saturday's. 03/30/23   Rojelio Nest, DO  Multiple Vitamins-Minerals (MULTIVITAMIN WOMEN PO) Take 1 tablet by mouth daily.    [provider]  nitroGLYCERIN  (NITROSTAT ) 0.4 MG SL tablet Place 1 tablet (0.4 mg total) under the tongue every 5 (five) minutes x 3 doses as needed for chest pain. 11/08/23   Lonni Slain, MD  pantoprazole  (PROTONIX ) 40 MG tablet Take 1 tablet (40 mg total) by mouth daily. 04/24/23   Celestia Rosaline SQUIBB, NP  polyethylene glycol powder (MIRALAX ) 17 GM/SCOOP powder Take 17 g by mouth daily as needed (constipation.). Patient taking differently: Take 17 g by mouth as needed (constipation.). 06/26/22   [provider]  rosuvastatin  (CRESTOR ) 10 MG tablet Take 10 mg by mouth at bedtime. 02/28/24   [provider]    Scheduled Meds:  Continuous Infusions:  PRN Meds:   Allergies:    Allergies  Allergen Reactions   Bee Pollen Anaphylaxis and Other (See Comments)    UNCONFIRMED, but IS allergic to bee VENOM   Bee Venom Anaphylaxis   Sulfa Antibiotics Itching and Rash   Chlorhexidine      Unknown rxn.   Iodinated Contrast Media Nausea And Vomiting    Social History:   Social History   Socioeconomic History   Marital status: Widowed    Spouse name: Not on file   Number of children: 2   Years of education: Not on file   Highest education level: Some college, no degree  Occupational History   Not on file  Tobacco Use   Smoking status: Never    Passive exposure: Never   Smokeless tobacco: Never   Tobacco comments:    Never smoke 10/20/22  Vaping Use   Vaping status: Never Used  Substance  and Sexual Activity   Alcohol  use: No   Drug use: No   Sexual activity: Not Currently  Other Topics Concern   Not on file  Social History Narrative   Pt lives in [place] with spouse.  2 grown  children.   Previously worked in [employer] at [employer].  Now on disability   Social Drivers of Health   Financial Resource Strain: Low Risk  (11/19/2023)   Overall Financial Resource Strain (CARDIA)    Difficulty of Paying Living Expenses: Not hard at all  Food Insecurity: No Food Insecurity (12/25/2023)   Hunger Vital Sign    Worried About Running Out of Food in the Last Year: Never true    Ran Out of Food in the Last Year: Never true  Transportation Needs: No Transportation Needs (12/25/2023)   PRAPARE - Administrator, Civil Service (Medical): No    Lack of Transportation (Non-Medical): No  Physical Activity: Insufficiently Active (06/04/2023)   Exercise Vital Sign    Days of Exercise per Week: 2 days    Minutes of Exercise per Session: 20 min  Stress: No Stress Concern Present (06/04/2023)   [employer] of Occupational Health - Occupational Stress Questionnaire    Feeling of Stress : Only a little  Social Connections: Moderately Isolated (12/25/2023)   Social Connection and Isolation Panel    Frequency of Communication with Friends and Family: More than three times a week    Frequency of Social Gatherings with Friends and Family: More than three times a week    Attends Religious Services: More than 4 times per year    Active Member of [employer] or Organizations: No    Attends Banker Meetings: Never    Marital Status: Widowed  Intimate Partner Violence: Not At Risk (12/25/2023)   Humiliation, Afraid, Rape, and Kick questionnaire    Fear of Current or Ex-Partner: No    Emotionally Abused: No    Physically Abused: No    Sexually Abused: No    Family History:    Family History  Problem Relation Age of Onset   Breast cancer Mother    Cancer Mother     Hypertension Mother    Cancer Father    Hypertension Sister    Hypertension Brother    CAD Other      ROS:  Please see the history of present illness.   All other ROS reviewed and negative.     Physical Exam/Data: Vitals:   03/03/24 1449 03/03/24 1831 03/03/24 1908  BP: 110/61 99/73 96/74   Pulse: (!) 47 (!) 47 80  Resp: 18 10 16   Temp:  (!) 97.5 F (36.4 C)   TempSrc:  Oral   SpO2: 98% 99% 93%   No intake or output data in the 24 hours ending 03/03/24 2208    02/29/2024   10:10 AM 02/01/2024    1:12 PM 01/25/2024   10:01 AM  Last 3 Weights  Weight (lbs) 287 lb 3.2 oz 276 lb 276 lb 12.8 oz  Weight (kg) 130.273 kg 125.193 kg 125.556 kg     There is no height or weight on file to calculate BMI.  General:  Well nourished, well developed, in no acute distress HEENT: normal Neck: no JVD Vascular: No carotid bruits; Distal pulses 2+ bilaterally Cardiac:  irregular rate Lungs:  clear to auscultation bilaterally, no wheezing, rhonchi or rales  Abd: soft, nontender, no hepatomegaly  Ext: no edema Musculoskeletal:  No deformities, BUE and BLE strength normal and equal Skin: warm and dry  Neuro:  CNs 2-12 intact, no focal abnormalities noted Psych:  Normal affect   EKG:  The EKG was personally reviewed and demonstrates:  atrial fibrillation with ventricular rates in the 100s. Telemetry:  Telemetry was personally reviewed and demonstrates:  AF  Relevant CV Studies:   TTE 12/26/2023  EXAM: Cardiac/Coronary  CTA   TECHNIQUE: The patient was scanned on a [employer].   FINDINGS: A 100 kV prospective scan was triggered in the descending thoracic aorta at 111 HU's. Axial non-contrast 3 mm slices were carried out through the heart. The data set was analyzed on a dedicated work station and scored using the Agatson method. Gantry rotation speed was 250 msecs and collimation was .6 mm. No beta blockade and 0.8 mg of sl NTG was given. The 3D data set was reconstructed  in 5% intervals of the 67-82 % of the R-R cycle. Diastolic phases were analyzed on a dedicated work station using MPR, MIP and VRT modes. The patient received 80 cc of contrast.   Image Quality: Fair with misregistration artifact.   Aorta: Normal size.  No calcifications.  No dissection.   Aortic Valve:  Trileaflet.  No calcifications.   Coronary Arteries:  Normal coronary origin.  Right Dominance.   RCA is a large dominant artery that gives rise to PDA and PLA. Proximal RCA with no plaques. The proximal to mid RCA with noted stair step artifact with poor visibility at that portion of the vessel with no apparent plaque plaque. The mid RCA with mild (24-49%) focal calcified plaque. Distal RCA with no plaques. PDA and PLA with no plaques.   Left main is a large artery that gives rise to LAD and LCX arteries.   LAD is a large vessel. The proximal LAD with no plaques. Noted stair-step artifact in the mid LAD with no apparent plaques.   D1 and D2 are small caliber vessels.   LCX is a non-dominant artery that gives rise to one large OM1 branch. The proximal LCX due to the Left atrial appendage closure device is not well visualized but there seems to be a focal soft plaque in the proximal LCX which appears to be mild. There is a focal minimal (<24%) soft plaque in the mid portion of the LCX vessel. The OM1 vessel with mild (24-49%) focal soft plaque.   Coronary Calcium  Score:   Left main: 0   Left anterior descending artery: 0   Left circumflex artery: 0   Right coronary artery: 58.9   Total: 58.9   Percentile: 78   Other findings:   Normal pulmonary vein drainage into the left atrium.   The pulmonary artery is mildly dilated (3.25 cm).   Status post annuloplasty of the mitral and tricuspid valve, annuloplasty ring well seated.   Status post left atrial appendage closure device.   Right atrium and right ventricle are both enlarged.   IMPRESSION: 1. Coronary  calcium  score of 58.9. This was 40 percentile for age and sex matched control.   2. Normal coronary origin with right dominance.   3. CAD-RADS 2. Mild non-obstructive CAD (25-49%). Consider non-atherosclerotic causes of chest pain. Consider preventive therapy and risk factor modification.   4. Total plaque volume 56 mm3 which is 26th percentile for age- and sex-matched controls (calcified plaque 15 mm3; non-calcified plaque 41 mm3,Low attenuation 1 mm3).   5. The pulmonary artery is mildly dilated (3.25 cm).   6. Status post annuloplasty of the mitral and tricuspid valve, annuloplasty ring well seated.   7. Status post left atrial appendage closure device.   8. Right atrium and right ventricle are both enlarged.   The noncardiac portion of this study will be interpreted in separate  report by the radiologist.  Laboratory Data: High Sensitivity Troponin:   Recent Labs  Lab 03/03/24 1651 03/03/24 1729 03/03/24 1914  TROPONINIHS 8 7 8     Chemistry Recent Labs  Lab 02/29/24 1108 03/03/24 1652  NA 137 134*  K 4.2 3.7  CL 93* 90*  CO2 32* 27  GLUCOSE 119* 116*  BUN 26 29*  CREATININE 6.93* 8.98*  CALCIUM  9.7 9.7  GFRNONAA  --  4*  ANIONGAP  --  17*    Recent Labs  Lab 03/03/24 1652  PROT 7.8  ALBUMIN  3.6  AST 16  ALT 10  ALKPHOS 93  BILITOT 0.9   Lipids No results for input(s): CHOL, TRIG, HDL, LABVLDL, LDLCALC, CHOLHDL in the last 168 hours.  Hematology Recent Labs  Lab 02/29/24 1108 03/03/24 1651  WBC 6.2 6.0  RBC 3.29* 3.24*  HGB 10.1* 10.3*  HCT 31.9* 32.8*  MCV 97 101.2*  MCH 30.7 31.8  MCHC 31.7 31.4  RDW 14.7 15.8*  PLT 149* 158   Thyroid  No results for input(s): TSH, FREET4 in the last 168 hours.  BNP Recent Labs  Lab 03/03/24 1651  BNP 253.5*    DDimer No results for input(s): DDIMER in the last 168 hours.  Radiology/Studies:  DG Chest 2 View Result Date: 03/03/2024 CLINICAL DATA:  Worsening chest pain and  shortness of breath. EXAM: CHEST - 2 VIEW COMPARISON:  Cardiac CT 12/26/2023. Chest radiographs 12/24/2023 and 02/01/2024. FINDINGS: Stable cardiomegaly post median sternotomy, dual valve replacement and left atrial appendage closure. The lungs are clear. No pleural effusion or pneumothorax. The bones appear unremarkable. IMPRESSION: Stable cardiomegaly without evidence of acute cardiopulmonary process. Electronically Signed   By: Elsie Perone M.D.   On: 03/03/2024 15:41     Assessment and Plan:  YITTY ROADS is a 68 y.o. 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  Plan atrial fibrillation, heart rates are well-controlled and she is euvolemic on exam.  She is having persistent chest pain, which I think is related to her arrhythmia, has no other objective evidence of infarct or ischemia.  Factors for progression of AF include morbid obesity, poor exercise, and untreated obstructive sleep apnea.  Will attempt another cardioversion plan to follow-up with electrophysiology for further therapy.   NPO at [facility] for electrical cardioversion Restart apixaban  Continue home metoprolol    Risk Assessment/Risk Scores:         CHA2DS2-VASc Score = 6   This indicates a 9.7% annual risk of stroke. The patient's score is based upon: CHF History: 1 HTN History: 1 Diabetes History: 1 Stroke History: 0 Vascular Disease History: 1 Age Score: 1 Gender Score: 1        For questions or updates, please contact [facility] Please consult [email or web address] for contact info under    Signed, Craigory Toste A Toris Laverdiere, MD  03/03/2024 10:08 PM

## 2024-03-03 NOTE — H&P (View-Only) (Signed)
 Cardiology Consultation   Patient ID: [name] MRN: [id number]; DOB: Sep 02, 1955  Admit date: 03/03/2024 Date of Consult: 03/03/2024  PCP:  Celestia Rosaline SQUIBB, NP   [facility] Providers Cardiologist:  Shelda Bruckner, MD  Electrophysiologist:  OLE ONEIDA HOLTS, MD       Patient Profile: MARKAN CAZAREZ is a 68 y.o. 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  History of Present Illness: Ms. [name] endorsed substernal chest pain and palpitations that have started for the past week.  She states her symptoms are persistent, and occasionally she has palpitations.  She has a pulse ox, and notes fluctuating heart rates.  Has had similar symptoms before she is contributed to her atrial fibrillation.  She is scheduled to get a cardioversion on 03/05/2024, however, she states her symptoms are debilitating and today's symptoms prompted her ER admission.  She states she has had atrial fibrillation for the past 8 years.  She has had many cardioversions in the past, most recently in April.  She has never had a catheter ablation in the past.  Previously been on dofetilide  in 2022, however this did not keep her in normal sinus rhythm.  Stroke reduction she is on Eliquis , and states she has not missed any doses for at least the past month.  For rate control she is on metoprolol  tartrate.  Patient has had several admissions for atrial fibrillation with rapid ventricular rates as well as CHF exacerbations.  Sleep apnea, however she currently does not have a CPAP machine and is waiting on her updated sleep study.  Not very active.  She has a strong family history of atrial fibrillation and both of her siblings.   Recently had a workup for obstructive coronary artery disease with a coronary CT in May, 2025 which showed mild nonobstructive coronary artery disease of the OM1 vessel and mid RCA, CAD RADS score 2 with a CAC score of 58.  Recent echocardiogram in 10/15/2023 showed normal EF with a severely enlarged left atria, with RV function, moderate TR.   Vitals include terrible heart rate between 80-93 per minute, blood pressure 108/57, SpO2 99% on room air.  Notable labs include high-sensitivity troponin 7->8, BNP 253, white blood cell count 6, hemoglobin 10.3, platelet count 158k.  Past Medical History:  Diagnosis Date   Acute on chronic systolic CHF (congestive heart failure) (HCC) 12/21/2020   Allergic rhinitis    Anemia    Arthritis    Asthma    Chronic diastolic CHF (congestive heart failure) (HCC)    Chronic kidney disease    Dialysis Tu/TH/Sa   COPD (chronic obstructive pulmonary disease) (HCC)    COVID-19 03/28/2023   Depression    DM (diabetes mellitus) (HCC)    DVT (deep vein thrombosis) in pregnancy    Gastroenteritis 03/27/2023   GERD (gastroesophageal reflux disease)    HTN (hypertension)    Hyperlipidemia    Obesity    Pneumonia 04/2018   RIGHT LOBE   Sleep apnea    needs to be retested - to get new cpap    Past Surgical History:  Procedure Laterality Date   ABDOMINAL HYSTERECTOMY     AV FISTULA PLACEMENT Left 03/17/2022   Procedure: LEFT ARM ARTERIOVENOUS (AV) FISTULA CREATION;  Surgeon: Eliza Bruckner RAMAN, MD;  Location: [facility] OR;  Service: Vascular;  Laterality: Left;   BIOPSY  01/12/2023   Procedure: BIOPSY;  Surgeon: Rollin Dover, MD;  Location: [facility];  Service: Gastroenterology;;   VASSIE STUDY  11/26/2020   Procedure: BUBBLE STUDY;  Surgeon: Lonni Slain, MD;  Location: [facility] ENDOSCOPY;  Service: Cardiovascular;;   CARDIOVERSION N/A 05/06/2020   Procedure: CARDIOVERSION;  Surgeon: Lonni Slain, MD;  Location: [facility] ENDOSCOPY;  Service:  Cardiovascular;  Laterality: N/A;   CARDIOVERSION N/A 11/04/2020   Procedure: CARDIOVERSION;  Surgeon: Pietro Redell RAMAN, MD;  Location: [facility] ENDOSCOPY;  Service: Cardiovascular;  Laterality: N/A;   CARDIOVERSION N/A 02/01/2021   Procedure: CARDIOVERSION;  Surgeon: Cherrie Toribio SAUNDERS, MD;  Location: [facility] ENDOSCOPY;  Service: Cardiovascular;  Laterality: N/A;   CARDIOVERSION N/A 02/09/2021   Procedure: CARDIOVERSION;  Surgeon: Cherrie Toribio SAUNDERS, MD;  Location: [facility] ENDOSCOPY;  Service: Cardiovascular;  Laterality: N/A;   CARDIOVERSION N/A 04/19/2021   Procedure: CARDIOVERSION;  Surgeon: Okey Vina GAILS, MD;  Location: [facility] ENDOSCOPY;  Service: Cardiovascular;  Laterality: N/A;   CARDIOVERSION N/A 11/25/2021   Procedure: CARDIOVERSION;  Surgeon: Cherrie Toribio SAUNDERS, MD;  Location: [facility] ENDOSCOPY;  Service: Cardiovascular;  Laterality: N/A;   CARDIOVERSION N/A 01/31/2022   Procedure: CARDIOVERSION;  Surgeon: Cherrie Toribio SAUNDERS, MD;  Location: [facility] ENDOSCOPY;  Service: Cardiovascular;  Laterality: N/A;   CARDIOVERSION N/A 07/23/2023   Procedure: CARDIOVERSION (CATH LAB);  Surgeon: Francyne Headland, MD;  Location: [facility] INVASIVE CV LAB;  Service: Cardiovascular;  Laterality: N/A;   CARDIOVERSION N/A 12/05/2023   Procedure: CARDIOVERSION;  Surgeon: Cherrie Toribio SAUNDERS, MD;  Location: [facility] INVASIVE CV LAB;  Service: Cardiovascular;  Laterality: N/A;   CATARACT EXTRACTION, BILATERAL     CHOLECYSTECTOMY     COLONOSCOPY WITH PROPOFOL  N/A 03/05/2015   Procedure: COLONOSCOPY WITH PROPOFOL ;  Surgeon: Dover Rollin, MD;  Location: [facility] ENDOSCOPY;  Service: Endoscopy;  Laterality: N/A;   DIAGNOSTIC LAPAROSCOPY     ESOPHAGOGASTRODUODENOSCOPY (EGD) WITH PROPOFOL  N/A 01/12/2023   Procedure: ESOPHAGOGASTRODUODENOSCOPY (EGD) WITH PROPOFOL ;  Surgeon: Rollin Dover, MD;  Location: [facility] ENDOSCOPY;  Service: Gastroenterology;  Laterality: N/A;   FISTULA SUPERFICIALIZATION Left 03/17/2022   Procedure: FISTULA SUPERFICIALIZATION -LEFT;  Surgeon:  Eliza Lonni RAMAN, MD;  Location: [facility] OR;  Service: Vascular;  Laterality: Left;   ingrown hallux Left    IR FLUORO GUIDE CV LINE RIGHT  03/14/2022   IR US  GUIDE VASC ACCESS RIGHT  03/14/2022   KNEE SURGERY     LEFT HEART CATH AND CORONARY ANGIOGRAPHY N/A 12/31/2017   Procedure: LEFT HEART CATH AND CORONARY ANGIOGRAPHY;  Surgeon: Levern Hutching, MD;  Location: [facility] INVASIVE CV LAB;  Service: Cardiovascular;  Laterality: N/A;   MAZE N/A 12/29/2020   Procedure: MAZE;  Surgeon: Kerrin Elspeth BROCKS, MD;  Location: [facility] OR;  Service: Open Heart Surgery;  Laterality: N/A;   MITRAL VALVE REPAIR N/A 12/29/2020   Procedure: MITRAL VALVE REPAIR USING CARBOMEDICS ANNULOFLEX RING SIZE 30;  Surgeon: Kerrin Elspeth BROCKS, MD;  Location: [facility] OR;  Service: Open Heart Surgery;  Laterality: N/A;   REVISON OF ARTERIOVENOUS FISTULA Left 01/05/2023   Procedure: REVISION OF LEFT ARTERIOVENOUS FISTULA WITH INTERPOSITION PTFE GRAFT;  Surgeon: Eliza Lonni RAMAN, MD;  Location: [facility] OR;  Service: Vascular;  Laterality: Left;  ELIQUIS  AND HOLD 48 X HOURS   RIGHT HEART CATH N/A 12/22/2020   Procedure: RIGHT HEART CATH;  Surgeon: Rolan Ezra RAMAN, MD;  Location: [facility] INVASIVE CV LAB;  Service: Cardiovascular;  Laterality: N/A;   RIGHT HEART CATH N/A  01/31/2021   Procedure: RIGHT HEART CATH;  Surgeon: Cherrie Toribio SAUNDERS, MD;  Location: [facility] INVASIVE CV LAB;  Service: Cardiovascular;  Laterality: N/A;   RIGHT HEART CATH N/A 07/29/2021   Procedure: RIGHT HEART CATH;  Surgeon: Cherrie Toribio SAUNDERS, MD;  Location: [facility] INVASIVE CV LAB;  Service: Cardiovascular;  Laterality: N/A;   RIGHT HEART CATH N/A 11/24/2021   Procedure: RIGHT HEART CATH;  Surgeon: Cherrie Toribio SAUNDERS, MD;  Location: [facility] INVASIVE CV LAB;  Service: Cardiovascular;  Laterality: N/A;   RIGHT HEART CATH N/A 02/14/2022   Procedure: RIGHT HEART CATH;  Surgeon: Cherrie Toribio SAUNDERS, MD;  Location: [facility] INVASIVE CV LAB;  Service: Cardiovascular;  Laterality: N/A;   RIGHT HEART CATH  N/A 02/22/2022   Procedure: RIGHT HEART CATH;  Surgeon: Rolan Ezra RAMAN, MD;  Location: [facility] INVASIVE CV LAB;  Service: Cardiovascular;  Laterality: N/A;   TEE WITHOUT CARDIOVERSION N/A 11/26/2020   Procedure: TRANSESOPHAGEAL ECHOCARDIOGRAM (TEE);  Surgeon: Lonni Slain, MD;  Location: [facility] ENDOSCOPY;  Service: Cardiovascular;  Laterality: N/A;   TEE WITHOUT CARDIOVERSION N/A 12/29/2020   Procedure: TRANSESOPHAGEAL ECHOCARDIOGRAM (TEE);  Surgeon: Kerrin Elspeth BROCKS, MD;  Location: [facility] OR;  Service: Open Heart Surgery;  Laterality: N/A;   TEE WITHOUT CARDIOVERSION N/A 01/20/2022   Procedure: TRANSESOPHAGEAL ECHOCARDIOGRAM (TEE);  Surgeon: Cherrie Toribio SAUNDERS, MD;  Location: [facility] ENDOSCOPY;  Service: Cardiovascular;  Laterality: N/A;   TRICUSPID VALVE REPLACEMENT N/A 12/29/2020   Procedure: 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 RING SIZE 34;  Surgeon: Kerrin Elspeth BROCKS, MD;  Location: [facility] OR;  Service: Open Heart Surgery;  Laterality: N/A;     Home Medications:  Prior to Admission medications   Medication Sig Start Date End Date Taking? Authorizing Provider  Accu-Chek Softclix Lancets lancets Use to check blood sugar 3 times daily. 01/01/24   Newlin, Enobong, MD  acetaminophen  (TYLENOL ) 325 MG tablet Take 2 tablets (650 mg total) by mouth every 6 (six) hours as needed for mild pain or moderate pain. 12/07/21   Arrien, Mauricio Daniel, MD  albuterol  (PROVENTIL  HFA) 108 [phone] Base) MCG/ACT inhaler Inhale 1-2 puffs into the lungs every 6 (six) hours as needed for wheezing or shortness of breath. 10/13/22   Celestia Rosaline SQUIBB, NP  amiodarone  (PACERONE ) 200 MG tablet Take 1 tablet (200 mg total) by mouth daily. 11/23/23   Bensimhon, Toribio SAUNDERS, MD  AURYXIA  1 GM 210 MG(Fe) tablet Take 210 mg by mouth 3 (three) times daily.    [provider]  Blood Glucose Monitoring Suppl (ACCU-CHEK GUIDE) w/Device KIT Use to check blood sugar 3 times daily. 01/01/24   Newlin, Enobong, MD  busPIRone   (BUSPAR ) 5 MG tablet TAKE ONE TABLET BY MOUTH TWICE DAILY @9AM -5PM 02/14/24   Celestia Rosaline SQUIBB, NP  diphenhydrAMINE  (BENADRYL ) 25 MG tablet Take 25 mg by mouth every 6 (six) hours as needed for itching. Patient taking differently: Take 25 mg by mouth as needed for itching. 03/31/22   [provider]  DULoxetine  (CYMBALTA ) 60 MG capsule TAKE ONE CAPSULE (60MG  TOTAL) BY MOUTH DAILY AT 9AM 02/29/24   Celestia Rosaline SQUIBB, NP  ELIQUIS  5 MG TABS tablet TAKE ONE TABLET (5MG  TOTAL) BY MOUTH TWICE DAILY @9AM -5PM 09/06/23   Bensimhon, Toribio SAUNDERS, MD  EPINEPHrine  0.3 mg/0.3 mL IJ SOAJ injection Inject 0.3 mg into the muscle as needed for anaphylaxis. 06/25/20   Celestia Rosaline SQUIBB, NP  fluticasone  (FLONASE  ALLERGY RELIEF) 50 MCG/ACT nasal spray Place 2 sprays into both nostrils daily as needed for  allergies. Patient taking differently: Place 2 sprays into both nostrils as needed for allergies. 03/31/22   [provider]  glucose blood (ACCU-CHEK GUIDE TEST) test strip Use to check blood sugar 3 times daily. 01/01/24   Newlin, Enobong, MD  insulin  glargine (LANTUS  SOLOSTAR) 100 UNIT/ML Solostar Pen Inject 10 Units into the skin daily. 09/27/23   Celestia Rosaline SQUIBB, NP  insulin  lispro (HUMALOG  KWIKPEN) 100 UNIT/ML KwikPen For blood sugars 0-150 give 0 units of insulin , 151-200 give 2 units of insulin , 201-250 give 4 units, 251-300 give 6 units, 301-350 give 8 units, 351-400 give 10 units,> 400 give 12 units and call M.D. 09/27/23   Newlin, Enobong, MD  lidocaine -prilocaine  (EMLA ) cream Apply 1 Application topically Every Tuesday,Thursday,and Saturday with dialysis. 06/01/22   [provider]  loratadine  (CLARITIN ) 10 MG tablet Take 1 tablet (10 mg total) by mouth every morning. 02/08/24   Celestia Rosaline SQUIBB, NP  Methoxy PEG-Epoetin  Beta (MIRCERA IJ) 50 mcg. 01/29/24 01/27/25  [provider]  metoCLOPramide  (REGLAN ) 5 MG tablet Take 1 tablet (5 mg total) by mouth every 8 (eight) hours as  needed for nausea. 07/10/23   Celestia Rosaline SQUIBB, NP  metoprolol  tartrate (LOPRESSOR ) 25 MG tablet Take 1 tablet (25 mg total) by mouth 2 (two) times daily. 01/25/24   Vannie Reche RAMAN, NP  midodrine  (PROAMATINE ) 10 MG tablet Take 1 tablet (10 mg total) by mouth See admin instructions. Take 10mg  (1 tablet) by mouth on Tuesday's, Thursday's, and Saturday's. 03/30/23   Rojelio Nest, DO  Multiple Vitamins-Minerals (MULTIVITAMIN WOMEN PO) Take 1 tablet by mouth daily.    [provider]  nitroGLYCERIN  (NITROSTAT ) 0.4 MG SL tablet Place 1 tablet (0.4 mg total) under the tongue every 5 (five) minutes x 3 doses as needed for chest pain. 11/08/23   Lonni Slain, MD  pantoprazole  (PROTONIX ) 40 MG tablet Take 1 tablet (40 mg total) by mouth daily. 04/24/23   Celestia Rosaline SQUIBB, NP  polyethylene glycol powder (MIRALAX ) 17 GM/SCOOP powder Take 17 g by mouth daily as needed (constipation.). Patient taking differently: Take 17 g by mouth as needed (constipation.). 06/26/22   [provider]  rosuvastatin  (CRESTOR ) 10 MG tablet Take 10 mg by mouth at bedtime. 02/28/24   [provider]    Scheduled Meds:  Continuous Infusions:  PRN Meds:   Allergies:    Allergies  Allergen Reactions   Bee Pollen Anaphylaxis and Other (See Comments)    UNCONFIRMED, but IS allergic to bee VENOM   Bee Venom Anaphylaxis   Sulfa Antibiotics Itching and Rash   Chlorhexidine      Unknown rxn.   Iodinated Contrast Media Nausea And Vomiting    Social History:   Social History   Socioeconomic History   Marital status: Widowed    Spouse name: Not on file   Number of children: 2   Years of education: Not on file   Highest education level: Some college, no degree  Occupational History   Not on file  Tobacco Use   Smoking status: Never    Passive exposure: Never   Smokeless tobacco: Never   Tobacco comments:    Never smoke 10/20/22  Vaping Use   Vaping status: Never Used  Substance  and Sexual Activity   Alcohol  use: No   Drug use: No   Sexual activity: Not Currently  Other Topics Concern   Not on file  Social History Narrative   Pt lives in [place] with spouse.  2 grown  children.   Previously worked in [employer] at [employer].  Now on disability   Social Drivers of Health   Financial Resource Strain: Low Risk  (11/19/2023)   Overall Financial Resource Strain (CARDIA)    Difficulty of Paying Living Expenses: Not hard at all  Food Insecurity: No Food Insecurity (12/25/2023)   Hunger Vital Sign    Worried About Running Out of Food in the Last Year: Never true    Ran Out of Food in the Last Year: Never true  Transportation Needs: No Transportation Needs (12/25/2023)   PRAPARE - Administrator, Civil Service (Medical): No    Lack of Transportation (Non-Medical): No  Physical Activity: Insufficiently Active (06/04/2023)   Exercise Vital Sign    Days of Exercise per Week: 2 days    Minutes of Exercise per Session: 20 min  Stress: No Stress Concern Present (06/04/2023)   [employer] of Occupational Health - Occupational Stress Questionnaire    Feeling of Stress : Only a little  Social Connections: Moderately Isolated (12/25/2023)   Social Connection and Isolation Panel    Frequency of Communication with Friends and Family: More than three times a week    Frequency of Social Gatherings with Friends and Family: More than three times a week    Attends Religious Services: More than 4 times per year    Active Member of [employer] or Organizations: No    Attends Banker Meetings: Never    Marital Status: Widowed  Intimate Partner Violence: Not At Risk (12/25/2023)   Humiliation, Afraid, Rape, and Kick questionnaire    Fear of Current or Ex-Partner: No    Emotionally Abused: No    Physically Abused: No    Sexually Abused: No    Family History:    Family History  Problem Relation Age of Onset   Breast cancer Mother    Cancer Mother     Hypertension Mother    Cancer Father    Hypertension Sister    Hypertension Brother    CAD Other      ROS:  Please see the history of present illness.   All other ROS reviewed and negative.     Physical Exam/Data: Vitals:   03/03/24 1449 03/03/24 1831 03/03/24 1908  BP: 110/61 99/73 96/74   Pulse: (!) 47 (!) 47 80  Resp: 18 10 16   Temp:  (!) 97.5 F (36.4 C)   TempSrc:  Oral   SpO2: 98% 99% 93%   No intake or output data in the 24 hours ending 03/03/24 2208    02/29/2024   10:10 AM 02/01/2024    1:12 PM 01/25/2024   10:01 AM  Last 3 Weights  Weight (lbs) 287 lb 3.2 oz 276 lb 276 lb 12.8 oz  Weight (kg) 130.273 kg 125.193 kg 125.556 kg     There is no height or weight on file to calculate BMI.  General:  Well nourished, well developed, in no acute distress HEENT: normal Neck: no JVD Vascular: No carotid bruits; Distal pulses 2+ bilaterally Cardiac:  irregular rate Lungs:  clear to auscultation bilaterally, no wheezing, rhonchi or rales  Abd: soft, nontender, no hepatomegaly  Ext: no edema Musculoskeletal:  No deformities, BUE and BLE strength normal and equal Skin: warm and dry  Neuro:  CNs 2-12 intact, no focal abnormalities noted Psych:  Normal affect   EKG:  The EKG was personally reviewed and demonstrates:  atrial fibrillation with ventricular rates in the 100s. Telemetry:  Telemetry was personally reviewed and demonstrates:  AF  Relevant CV Studies:   TTE 12/26/2023  EXAM: Cardiac/Coronary  CTA   TECHNIQUE: The patient was scanned on a [employer].   FINDINGS: A 100 kV prospective scan was triggered in the descending thoracic aorta at 111 HU's. Axial non-contrast 3 mm slices were carried out through the heart. The data set was analyzed on a dedicated work station and scored using the Agatson method. Gantry rotation speed was 250 msecs and collimation was .6 mm. No beta blockade and 0.8 mg of sl NTG was given. The 3D data set was reconstructed  in 5% intervals of the 67-82 % of the R-R cycle. Diastolic phases were analyzed on a dedicated work station using MPR, MIP and VRT modes. The patient received 80 cc of contrast.   Image Quality: Fair with misregistration artifact.   Aorta: Normal size.  No calcifications.  No dissection.   Aortic Valve:  Trileaflet.  No calcifications.   Coronary Arteries:  Normal coronary origin.  Right Dominance.   RCA is a large dominant artery that gives rise to PDA and PLA. Proximal RCA with no plaques. The proximal to mid RCA with noted stair step artifact with poor visibility at that portion of the vessel with no apparent plaque plaque. The mid RCA with mild (24-49%) focal calcified plaque. Distal RCA with no plaques. PDA and PLA with no plaques.   Left main is a large artery that gives rise to LAD and LCX arteries.   LAD is a large vessel. The proximal LAD with no plaques. Noted stair-step artifact in the mid LAD with no apparent plaques.   D1 and D2 are small caliber vessels.   LCX is a non-dominant artery that gives rise to one large OM1 branch. The proximal LCX due to the Left atrial appendage closure device is not well visualized but there seems to be a focal soft plaque in the proximal LCX which appears to be mild. There is a focal minimal (<24%) soft plaque in the mid portion of the LCX vessel. The OM1 vessel with mild (24-49%) focal soft plaque.   Coronary Calcium  Score:   Left main: 0   Left anterior descending artery: 0   Left circumflex artery: 0   Right coronary artery: 58.9   Total: 58.9   Percentile: 78   Other findings:   Normal pulmonary vein drainage into the left atrium.   The pulmonary artery is mildly dilated (3.25 cm).   Status post annuloplasty of the mitral and tricuspid valve, annuloplasty ring well seated.   Status post left atrial appendage closure device.   Right atrium and right ventricle are both enlarged.   IMPRESSION: 1. Coronary  calcium  score of 58.9. This was 40 percentile for age and sex matched control.   2. Normal coronary origin with right dominance.   3. CAD-RADS 2. Mild non-obstructive CAD (25-49%). Consider non-atherosclerotic causes of chest pain. Consider preventive therapy and risk factor modification.   4. Total plaque volume 56 mm3 which is 26th percentile for age- and sex-matched controls (calcified plaque 15 mm3; non-calcified plaque 41 mm3,Low attenuation 1 mm3).   5. The pulmonary artery is mildly dilated (3.25 cm).   6. Status post annuloplasty of the mitral and tricuspid valve, annuloplasty ring well seated.   7. Status post left atrial appendage closure device.   8. Right atrium and right ventricle are both enlarged.   The noncardiac portion of this study will be interpreted in separate  report by the radiologist.  Laboratory Data: High Sensitivity Troponin:   Recent Labs  Lab 03/03/24 1651 03/03/24 1729 03/03/24 1914  TROPONINIHS 8 7 8     Chemistry Recent Labs  Lab 02/29/24 1108 03/03/24 1652  NA 137 134*  K 4.2 3.7  CL 93* 90*  CO2 32* 27  GLUCOSE 119* 116*  BUN 26 29*  CREATININE 6.93* 8.98*  CALCIUM  9.7 9.7  GFRNONAA  --  4*  ANIONGAP  --  17*    Recent Labs  Lab 03/03/24 1652  PROT 7.8  ALBUMIN  3.6  AST 16  ALT 10  ALKPHOS 93  BILITOT 0.9   Lipids No results for input(s): CHOL, TRIG, HDL, LABVLDL, LDLCALC, CHOLHDL in the last 168 hours.  Hematology Recent Labs  Lab 02/29/24 1108 03/03/24 1651  WBC 6.2 6.0  RBC 3.29* 3.24*  HGB 10.1* 10.3*  HCT 31.9* 32.8*  MCV 97 101.2*  MCH 30.7 31.8  MCHC 31.7 31.4  RDW 14.7 15.8*  PLT 149* 158   Thyroid  No results for input(s): TSH, FREET4 in the last 168 hours.  BNP Recent Labs  Lab 03/03/24 1651  BNP 253.5*    DDimer No results for input(s): DDIMER in the last 168 hours.  Radiology/Studies:  DG Chest 2 View Result Date: 03/03/2024 CLINICAL DATA:  Worsening chest pain and  shortness of breath. EXAM: CHEST - 2 VIEW COMPARISON:  Cardiac CT 12/26/2023. Chest radiographs 12/24/2023 and 02/01/2024. FINDINGS: Stable cardiomegaly post median sternotomy, dual valve replacement and left atrial appendage closure. The lungs are clear. No pleural effusion or pneumothorax. The bones appear unremarkable. IMPRESSION: Stable cardiomegaly without evidence of acute cardiopulmonary process. Electronically Signed   By: Elsie Perone M.D.   On: 03/03/2024 15:41     Assessment and Plan:  YITTY ROADS is a 68 y.o. 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  Plan atrial fibrillation, heart rates are well-controlled and she is euvolemic on exam.  She is having persistent chest pain, which I think is related to her arrhythmia, has no other objective evidence of infarct or ischemia.  Factors for progression of AF include morbid obesity, poor exercise, and untreated obstructive sleep apnea.  Will attempt another cardioversion plan to follow-up with electrophysiology for further therapy.   NPO at [facility] for electrical cardioversion Restart apixaban  Continue home metoprolol    Risk Assessment/Risk Scores:         CHA2DS2-VASc Score = 6   This indicates a 9.7% annual risk of stroke. The patient's score is based upon: CHF History: 1 HTN History: 1 Diabetes History: 1 Stroke History: 0 Vascular Disease History: 1 Age Score: 1 Gender Score: 1        For questions or updates, please contact [facility] Please consult [email or web address] for contact info under    Signed, Craigory Toste A Toris Laverdiere, MD  03/03/2024 10:08 PM

## 2024-03-03 NOTE — Telephone Encounter (Signed)
  FYI Only or Action Required?: FYI only for provider.  Patient was last seen in primary care on 01/21/2024 by Celestia Rosaline SQUIBB, NP.  Called Nurse Triage reporting Chest Pain.  Symptoms began about 6 days ago.  Interventions attempted: Rest, hydration, or home remedies.  Symptoms are: unchanged.  Triage Disposition: Go to ED Now (Notify PCP)  Patient/caregiver understands and will follow disposition?: No, refuses disposition                   Copied from CRM [id number]. Topic: General - Other >> Mar 03, 2024 12:00 PM Zebedee SAUNDERS wrote: Reason for CRM: Received call from [facility] per Consuelo Flavin ph: [phone] stated pt saw Cardiologist on Friday. Pt has chest pain. Reason for Disposition  [1] Chest pain (or angina) comes and goes AND [2] is happening more often (increasing in frequency) or getting worse (increasing in severity)  (Exception: Chest pains that last only a few seconds.)    Patient has a significant cardiac history  Answer Assessment - Initial Assessment Questions 97.4 71 heart rate  (irregular) 100/62 History of Afib  1. LOCATION: Where does it hurt?       Chest  2. RADIATION: Does the pain go anywhere else? (e.g., into neck, jaw, arms, back)     No 3. ONSET: When did the chest pain begin? (Minutes, hours or days)      6 days ago at dialysis      dialysis patient 4. PATTERN: Does the pain come and go, or has it been constant since it started?  Does it get worse with exertion?      Comes and goes 5. DURATION: How long does it last (e.g., seconds, minutes, hours)     varies 6. SEVERITY: How bad is the pain?  (e.g., Scale 1-10; mild, moderate, or severe)     Can go up to a 7    comes and goes   6 out of 10 now 7. CARDIAC RISK FACTORS: Do you have any history of heart problems or risk factors for heart disease? (e.g., angina, prior heart attack; diabetes, high blood pressure, high cholesterol, smoker, or strong family  history of heart disease)     17 cardioversions in the past 8. PULMONARY RISK FACTORS: Do you have any history of lung disease?  (e.g., blood clots in lung, asthma, emphysema, birth control pills)     COPD 9. CAUSE: What do you think is causing the chest pain?     ------ 10. OTHER SYMPTOMS: Do you have any other symptoms? (e.g., dizziness, nausea, vomiting, sweating, fever, difficulty breathing, cough)       Patient denies  Protocols used: Chest Pain-A-AH

## 2024-03-03 NOTE — ED Provider Triage Note (Signed)
 Emergency Medicine Provider Triage Evaluation Note  [name] , a 68 y.o. female  was evaluated in triage.  Pt complains of patient here with worsening chest pain and shortness of breath.  Crackles in the lung, placed on oxygen , history of atrial fibrillation.  Review of Systems  Positive: Chest pain, shortness of breath, leg swelling Negative: Fever, productive cough  Physical Exam  BP 110/61 (BP Location: Right Arm)   Pulse (!) 47   Resp 18   SpO2 98%  Gen:   Awake, no distress   Resp:  Normal effort, crackles on exam MSK:   Moves extremities without difficulty edema noted Other:  Cardiac with irregular rhythm  Medical Decision Making  Medically screening exam initiated at 3:16 PM.  Appropriate orders placed.  Adrena Nakamura [name] was informed that the remainder of the evaluation will be completed by another provider, this initial triage assessment does not replace that evaluation, and the importance of remaining in the ED until their evaluation is complete.     Doretha Folks, MD 03/03/24 1517

## 2024-03-03 NOTE — H&P (Incomplete)
 History and Physical    [name] FMW:[id number] DOB: 17-Jun-1956 DOA: 03/03/2024  PCP: Celestia Rosaline SQUIBB, NP  Patient coming from: home  I have personally briefly reviewed patient's old medical records in [facility]  Chief Complaint: chest pain   HPI: [name] is a 68 y.o. 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 Patient describes pain as sharp.  Patient of note was seen by cardiologist on    ED Course: ***  Review of Systems: As per HPI otherwise 10 point review of systems negative.   Past Medical History:  Diagnosis Date  . Acute on chronic systolic CHF (congestive heart failure) (HCC) 12/21/2020  . Allergic rhinitis   . Anemia   . Arthritis   . Asthma   . Chronic diastolic CHF (congestive heart failure) (HCC)   . Chronic kidney disease    Dialysis Tu/TH/Sa  . COPD (chronic obstructive pulmonary disease) (HCC)   . COVID-19 03/28/2023  . Depression   . DM (diabetes mellitus) (HCC)   . DVT (deep vein thrombosis) in pregnancy   . Gastroenteritis 03/27/2023  . GERD (gastroesophageal reflux disease)   . HTN (hypertension)   . Hyperlipidemia   . Obesity   . Pneumonia 04/2018   RIGHT LOBE  . Sleep apnea    needs to be retested - to get new cpap    Past Surgical History:  Procedure Laterality Date  . ABDOMINAL HYSTERECTOMY    . AV FISTULA PLACEMENT Left 03/17/2022   Procedure: LEFT ARM ARTERIOVENOUS (AV) FISTULA CREATION;  Surgeon: Eliza Lonni RAMAN, MD;  Location: [facility] OR;  Service: Vascular;  Laterality: Left;  . BIOPSY  01/12/2023   Procedure: BIOPSY;  Surgeon: Rollin Dover, MD;  Location: [facility];  Service: Gastroenterology;;  . VASSIE STUDY  11/26/2020   Procedure:  BUBBLE STUDY;  Surgeon: Lonni Slain, MD;  Location: [facility] ENDOSCOPY;  Service: Cardiovascular;;  . CARDIOVERSION N/A 05/06/2020   Procedure: CARDIOVERSION;  Surgeon: Lonni Slain, MD;  Location: [facility] ENDOSCOPY;  Service: Cardiovascular;  Laterality: N/A;  . CARDIOVERSION N/A 11/04/2020   Procedure: CARDIOVERSION;  Surgeon: Pietro Redell RAMAN, MD;  Location: [facility] ENDOSCOPY;  Service: Cardiovascular;  Laterality: N/A;  . CARDIOVERSION N/A 02/01/2021   Procedure: CARDIOVERSION;  Surgeon: Cherrie Toribio SAUNDERS, MD;  Location: [facility] ENDOSCOPY;  Service: Cardiovascular;  Laterality: N/A;  . CARDIOVERSION N/A 02/09/2021   Procedure: CARDIOVERSION;  Surgeon: Cherrie Toribio SAUNDERS, MD;  Location: [facility] ENDOSCOPY;  Service: Cardiovascular;  Laterality: N/A;  . CARDIOVERSION N/A 04/19/2021   Procedure: CARDIOVERSION;  Surgeon: Okey Vina GAILS, MD;  Location: [facility] ENDOSCOPY;  Service: Cardiovascular;  Laterality: N/A;  . CARDIOVERSION N/A 11/25/2021   Procedure: CARDIOVERSION;  Surgeon: Cherrie Toribio SAUNDERS, MD;  Location: [facility] ENDOSCOPY;  Service: Cardiovascular;  Laterality: N/A;  . CARDIOVERSION N/A 01/31/2022   Procedure: CARDIOVERSION;  Surgeon: Cherrie Toribio SAUNDERS, MD;  Location: [facility] ENDOSCOPY;  Service: Cardiovascular;  Laterality: N/A;  . CARDIOVERSION N/A 07/23/2023   Procedure: CARDIOVERSION (CATH LAB);  Surgeon: Francyne Headland, MD;  Location: [facility] INVASIVE CV LAB;  Service: Cardiovascular;  Laterality: N/A;  . CARDIOVERSION N/A 12/05/2023   Procedure: CARDIOVERSION;  Surgeon: Cherrie Toribio SAUNDERS, MD;  Location: [facility] INVASIVE CV LAB;  Service: Cardiovascular;  Laterality: N/A;  . CATARACT EXTRACTION, BILATERAL    .  CHOLECYSTECTOMY    . COLONOSCOPY WITH PROPOFOL  N/A 03/05/2015   Procedure: COLONOSCOPY WITH PROPOFOL ;  Surgeon: Belvie Just, MD;  Location: [facility] ENDOSCOPY;  Service: Endoscopy;  Laterality: N/A;  . DIAGNOSTIC LAPAROSCOPY    . ESOPHAGOGASTRODUODENOSCOPY (EGD) WITH PROPOFOL  N/A 01/12/2023   Procedure:  ESOPHAGOGASTRODUODENOSCOPY (EGD) WITH PROPOFOL ;  Surgeon: Just Belvie, MD;  Location: [facility] ENDOSCOPY;  Service: Gastroenterology;  Laterality: N/A;  . FISTULA SUPERFICIALIZATION Left 03/17/2022   Procedure: FISTULA SUPERFICIALIZATION -LEFT;  Surgeon: Eliza Lonni RAMAN, MD;  Location: [facility] OR;  Service: Vascular;  Laterality: Left;  . ingrown hallux Left   . IR FLUORO GUIDE CV LINE RIGHT  03/14/2022  . IR US  GUIDE VASC ACCESS RIGHT  03/14/2022  . KNEE SURGERY    . LEFT HEART CATH AND CORONARY ANGIOGRAPHY N/A 12/31/2017   Procedure: LEFT HEART CATH AND CORONARY ANGIOGRAPHY;  Surgeon: Levern Hutching, MD;  Location: [facility] INVASIVE CV LAB;  Service: Cardiovascular;  Laterality: N/A;  . MAZE N/A 12/29/2020   Procedure: MAZE;  Surgeon: Kerrin Elspeth BROCKS, MD;  Location: [facility] OR;  Service: Open Heart Surgery;  Laterality: N/A;  . MITRAL VALVE REPAIR N/A 12/29/2020   Procedure: MITRAL VALVE REPAIR USING CARBOMEDICS ANNULOFLEX RING SIZE 30;  Surgeon: Kerrin Elspeth BROCKS, MD;  Location: [facility] OR;  Service: Open Heart Surgery;  Laterality: N/A;  . REVISON OF ARTERIOVENOUS FISTULA Left 01/05/2023   Procedure: REVISION OF LEFT ARTERIOVENOUS FISTULA WITH INTERPOSITION PTFE GRAFT;  Surgeon: Eliza Lonni RAMAN, MD;  Location: [facility] OR;  Service: Vascular;  Laterality: Left;  ELIQUIS  AND HOLD 48 X HOURS  . RIGHT HEART CATH N/A 12/22/2020   Procedure: RIGHT HEART CATH;  Surgeon: Rolan Ezra RAMAN, MD;  Location: [facility] INVASIVE CV LAB;  Service: Cardiovascular;  Laterality: N/A;  . RIGHT HEART CATH N/A 01/31/2021   Procedure: RIGHT HEART CATH;  Surgeon: Cherrie Toribio SAUNDERS, MD;  Location: [facility] INVASIVE CV LAB;  Service: Cardiovascular;  Laterality: N/A;  . RIGHT HEART CATH N/A 07/29/2021   Procedure: RIGHT HEART CATH;  Surgeon: Cherrie Toribio SAUNDERS, MD;  Location: [facility] INVASIVE CV LAB;  Service: Cardiovascular;  Laterality: N/A;  . RIGHT HEART CATH N/A 11/24/2021   Procedure: RIGHT HEART CATH;  Surgeon: Cherrie Toribio SAUNDERS, MD;   Location: [facility]VASIVE CV LAB;  Service: Cardiovascular;  Laterality: N/A;  . RIGHT HEART CATH N/A 02/14/2022   Procedure: RIGHT HEART CATH;  Surgeon: Cherrie Toribio SAUNDERS, MD;  Location: [facility] INVASIVE CV LAB;  Service: Cardiovascular;  Laterality: N/A;  . RIGHT HEART CATH N/A 02/22/2022   Procedure: RIGHT HEART CATH;  Surgeon: Rolan Ezra RAMAN, MD;  Location: [facility] INVASIVE CV LAB;  Service: Cardiovascular;  Laterality: N/A;  . TEE WITHOUT CARDIOVERSION N/A 11/26/2020   Procedure: TRANSESOPHAGEAL ECHOCARDIOGRAM (TEE);  Surgeon: Lonni Slain, MD;  Location: [facility] ENDOSCOPY;  Service: Cardiovascular;  Laterality: N/A;  . TEE WITHOUT CARDIOVERSION N/A 12/29/2020   Procedure: TRANSESOPHAGEAL ECHOCARDIOGRAM (TEE);  Surgeon: Kerrin Elspeth BROCKS, MD;  Location: [facility] OR;  Service: Open Heart Surgery;  Laterality: N/A;  . TEE WITHOUT CARDIOVERSION N/A 01/20/2022   Procedure: TRANSESOPHAGEAL ECHOCARDIOGRAM (TEE);  Surgeon: Cherrie Toribio SAUNDERS, MD;  Location: [facility] ENDOSCOPY;  Service: Cardiovascular;  Laterality: N/A;  . TRICUSPID VALVE REPLACEMENT N/A 12/29/2020   Procedure: 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 RING SIZE 34;  Surgeon: Kerrin Elspeth BROCKS, MD;  Location: [facility] OR;  Service: Open Heart Surgery;  Laterality: N/A;     reports that she has never smoked. She has never been exposed to  tobacco smoke. She has never used smokeless tobacco. She reports that she does not drink alcohol  and does not use drugs.  Allergies  Allergen Reactions  . Bee Pollen Anaphylaxis and Other (See Comments)    UNCONFIRMED, but IS allergic to bee VENOM  . Bee Venom Anaphylaxis  . Sulfa Antibiotics Itching and Rash  . Chlorhexidine      Unknown rxn.  . Iodinated Contrast Media Nausea And Vomiting    Family History  Problem Relation Age of Onset  . Breast cancer Mother   . Cancer Mother   . Hypertension Mother   . Cancer Father   . Hypertension Sister   . Hypertension Brother   . CAD Other    *** Prior  to Admission medications   Medication Sig Start Date End Date Taking? Authorizing Provider  Accu-Chek Softclix Lancets lancets Use to check blood sugar 3 times daily. 01/01/24   Newlin, Enobong, MD  acetaminophen  (TYLENOL ) 325 MG tablet Take 2 tablets (650 mg total) by mouth every 6 (six) hours as needed for mild pain or moderate pain. 12/07/21   Arrien, Mauricio Daniel, MD  albuterol  (PROVENTIL  HFA) 108 [phone] Base) MCG/ACT inhaler Inhale 1-2 puffs into the lungs every 6 (six) hours as needed for wheezing or shortness of breath. 10/13/22   Celestia Rosaline SQUIBB, NP  amiodarone  (PACERONE ) 200 MG tablet Take 1 tablet (200 mg total) by mouth daily. 11/23/23   Bensimhon, Toribio SAUNDERS, MD  AURYXIA  1 GM 210 MG(Fe) tablet Take 210 mg by mouth 3 (three) times daily.    [provider]  Blood Glucose Monitoring Suppl (ACCU-CHEK GUIDE) w/Device KIT Use to check blood sugar 3 times daily. 01/01/24   Newlin, Enobong, MD  busPIRone  (BUSPAR ) 5 MG tablet TAKE ONE TABLET BY MOUTH TWICE DAILY @9AM -5PM 02/14/24   Celestia Rosaline SQUIBB, NP  diphenhydrAMINE  (BENADRYL ) 25 MG tablet Take 25 mg by mouth every 6 (six) hours as needed for itching. Patient taking differently: Take 25 mg by mouth as needed for itching. 03/31/22   [provider]  DULoxetine  (CYMBALTA ) 60 MG capsule TAKE ONE CAPSULE (60MG  TOTAL) BY MOUTH DAILY AT 9AM 02/29/24   Celestia Rosaline SQUIBB, NP  ELIQUIS  5 MG TABS tablet TAKE ONE TABLET (5MG  TOTAL) BY MOUTH TWICE DAILY @9AM -5PM 09/06/23   Bensimhon, Toribio SAUNDERS, MD  EPINEPHrine  0.3 mg/0.3 mL IJ SOAJ injection Inject 0.3 mg into the muscle as needed for anaphylaxis. 06/25/20   Celestia Rosaline SQUIBB, NP  fluticasone  (FLONASE  ALLERGY RELIEF) 50 MCG/ACT nasal spray Place 2 sprays into both nostrils daily as needed for allergies. Patient taking differently: Place 2 sprays into both nostrils as needed for allergies. 03/31/22   [provider]  glucose blood (ACCU-CHEK GUIDE TEST) test strip Use to check blood  sugar 3 times daily. 01/01/24   Newlin, Enobong, MD  insulin  glargine (LANTUS  SOLOSTAR) 100 UNIT/ML Solostar Pen Inject 10 Units into the skin daily. 09/27/23   Celestia Rosaline SQUIBB, NP  insulin  lispro (HUMALOG  KWIKPEN) 100 UNIT/ML KwikPen For blood sugars 0-150 give 0 units of insulin , 151-200 give 2 units of insulin , 201-250 give 4 units, 251-300 give 6 units, 301-350 give 8 units, 351-400 give 10 units,> 400 give 12 units and call M.D. 09/27/23   Newlin, Enobong, MD  lidocaine -prilocaine  (EMLA ) cream Apply 1 Application topically Every Tuesday,Thursday,and Saturday with dialysis. 06/01/22   [provider]  loratadine  (CLARITIN ) 10 MG tablet Take 1 tablet (10 mg total) by mouth every morning. 02/08/24   Celestia Rosaline SQUIBB,  NP  Methoxy PEG-Epoetin  Beta (MIRCERA IJ) 50 mcg. 01/29/24 01/27/25  [provider]  metoCLOPramide  (REGLAN ) 5 MG tablet Take 1 tablet (5 mg total) by mouth every 8 (eight) hours as needed for nausea. 07/10/23   Celestia Rosaline SQUIBB, NP  metoprolol  tartrate (LOPRESSOR ) 25 MG tablet Take 1 tablet (25 mg total) by mouth 2 (two) times daily. 01/25/24   Vannie Reche RAMAN, NP  midodrine  (PROAMATINE ) 10 MG tablet Take 1 tablet (10 mg total) by mouth See admin instructions. Take 10mg  (1 tablet) by mouth on Tuesday's, Thursday's, and Saturday's. 03/30/23   Rojelio Nest, DO  Multiple Vitamins-Minerals (MULTIVITAMIN WOMEN PO) Take 1 tablet by mouth daily.    [provider]  nitroGLYCERIN  (NITROSTAT ) 0.4 MG SL tablet Place 1 tablet (0.4 mg total) under the tongue every 5 (five) minutes x 3 doses as needed for chest pain. 11/08/23   Lonni Slain, MD  pantoprazole  (PROTONIX ) 40 MG tablet Take 1 tablet (40 mg total) by mouth daily. 04/24/23   Celestia Rosaline SQUIBB, NP  polyethylene glycol powder (MIRALAX ) 17 GM/SCOOP powder Take 17 g by mouth daily as needed (constipation.). Patient taking differently: Take 17 g by mouth as needed (constipation.). 06/26/22   [provider]  rosuvastatin  (CRESTOR ) 10 MG tablet Take 10 mg by mouth at bedtime. 02/28/24   [provider]    Physical Exam: Vitals:   03/03/24 1449 03/03/24 1831 03/03/24 1908 03/03/24 2242  BP: 110/61 99/73 96/74  (!) 108/57  Pulse: (!) 47 (!) 47 80 93  Resp: 18 10 16 20   Temp:  (!) 97.5 F (36.4 C)    TempSrc:  Oral    SpO2: 98% 99% 93% 94%    Constitutional: NAD, calm, comfortable Vitals:   03/03/24 1449 03/03/24 1831 03/03/24 1908 03/03/24 2242  BP: 110/61 99/73 96/74  (!) 108/57  Pulse: (!) 47 (!) 47 80 93  Resp: 18 10 16 20   Temp:  (!) 97.5 F (36.4 C)    TempSrc:  Oral    SpO2: 98% 99% 93% 94%   Eyes: PERRL, lids and conjunctivae normal ENMT: Mucous membranes are moist. Posterior pharynx clear of any exudate or lesions.Normal dentition.  Neck: normal, supple, no masses, no thyromegaly Respiratory: clear to auscultation bilaterally, no wheezing, no crackles. Normal respiratory effort. No accessory muscle use.  Cardiovascular: Regular rate and rhythm, no murmurs / rubs / gallops. No extremity edema. 2+ pedal pulses. No carotid bruits.  Abdomen: no tenderness, no masses palpated. No hepatosplenomegaly. Bowel sounds positive.  Musculoskeletal: no clubbing / cyanosis. No joint deformity upper and lower extremities. Good ROM, no contractures. Normal muscle tone.  Skin: no rashes, lesions, ulcers. No induration Neurologic: CN 2-12 grossly intact. Sensation intact, DTR normal. Strength 5/5 in all 4.  Psychiatric: Normal judgment and insight. Alert and oriented x 3. Normal mood.    Labs on Admission: I have personally reviewed following labs and imaging studies  CBC: Recent Labs  Lab 02/29/24 1108 03/03/24 1651  WBC 6.2 6.0  HGB 10.1* 10.3*  HCT 31.9* 32.8*  MCV 97 101.2*  PLT 149* 158   Basic Metabolic Panel: Recent Labs  Lab 02/29/24 1108 03/03/24 1652  NA 137 134*  K 4.2 3.7  CL 93* 90*  CO2 32* 27  GLUCOSE 119* 116*  BUN 26 29*  CREATININE  6.93* 8.98*  CALCIUM  9.7 9.7   GFR: Estimated Creatinine Clearance: 8.6 mL/min (A) (by C-G formula based on SCr of 8.98 mg/dL (H)). Liver Function Tests: Recent  Labs  Lab 03/03/24 1652  AST 16  ALT 10  ALKPHOS 93  BILITOT 0.9  PROT 7.8  ALBUMIN  3.6   No results for input(s): LIPASE, AMYLASE in the last 168 hours. No results for input(s): AMMONIA in the last 168 hours. Coagulation Profile: No results for input(s): INR, PROTIME in the last 168 hours. Cardiac Enzymes: No results for input(s): CKTOTAL, CKMB, CKMBINDEX, TROPONINI in the last 168 hours. BNP (last 3 results) Recent Labs    12/24/23 1004  PROBNP 8,609.0*   HbA1C: No results for input(s): HGBA1C in the last 72 hours. CBG: No results for input(s): GLUCAP in the last 168 hours. Lipid Profile: No results for input(s): CHOL, HDL, LDLCALC, TRIG, CHOLHDL, LDLDIRECT in the last 72 hours. Thyroid  Function Tests: No results for input(s): TSH, T4TOTAL, FREET4, T3FREE, THYROIDAB in the last 72 hours. Anemia Panel: No results for input(s): VITAMINB12, FOLATE, FERRITIN, TIBC, IRON , RETICCTPCT in the last 72 hours. Urine analysis:    Component Value Date/Time   COLORURINE AMBER (A) 11/07/2022 1447   APPEARANCEUR CLOUDY (A) 11/07/2022 1447   LABSPEC 1.015 11/07/2022 1447   PHURINE 5.0 11/07/2022 1447   GLUCOSEU NEGATIVE 11/07/2022 1447   HGBUR SMALL (A) 11/07/2022 1447   BILIRUBINUR small (A) 02/21/2023 1748   KETONESUR trace (5) (A) 02/21/2023 1748   KETONESUR NEGATIVE 11/07/2022 1447   PROTEINUR =100 (A) 02/21/2023 1748   PROTEINUR 100 (A) 11/07/2022 1447   UROBILINOGEN 0.2 02/21/2023 1748   UROBILINOGEN 1.0 10/26/2014 0249   NITRITE Negative 02/21/2023 1748   NITRITE NEGATIVE 11/07/2022 1447   LEUKOCYTESUR Negative 02/21/2023 1748   LEUKOCYTESUR TRACE (A) 11/07/2022 1447    Radiological Exams on Admission: DG Chest 2 View Result Date: 03/03/2024 CLINICAL  DATA:  Worsening chest pain and shortness of breath. EXAM: CHEST - 2 VIEW COMPARISON:  Cardiac CT 12/26/2023. Chest radiographs 12/24/2023 and 02/01/2024. FINDINGS: Stable cardiomegaly post median sternotomy, dual valve replacement and left atrial appendage closure. The lungs are clear. No pleural effusion or pneumothorax. The bones appear unremarkable. IMPRESSION: Stable cardiomegaly without evidence of acute cardiopulmonary process. Electronically Signed   By: Elsie Perone M.D.   On: 03/03/2024 15:41    EKG: Independently reviewed. ***  Assessment/Plan Principal Problem:   Chest pain   ***  DVT prophylaxis: *** (Lovenox /Heparin /SCD's/anticoagulated/None (if comfort care) Code Status: *** (Full/Partial (specify details) Family Communication: *** (Specify name, relationship. Do not write discussed with patient. Specify tel # if discussed over the phone) Disposition Plan: *** (specify when and where you expect patient to be discharged) Consults called: *** (with names) Admission status: *** (inpatient / obs / tele / medical floor / SDU)   Camila DELENA Ned MD [facility] Pager [phone]  If 7PM-7AM, please contact night-coverage [email or web address] Password [facility]  03/03/2024, 11:45 PM

## 2024-03-03 NOTE — ED Provider Notes (Signed)
 [place] EMERGENCY DEPARTMENT AT [facility] Provider Note   CSN: [id number] Arrival date & time: 03/03/24  1416     Patient presents with: Chest Pain   [name] is a 68 y.o. female.   68 year old female with a past medical history of persistent A-fib, obesity, and end-stage renal failure on hemodialysis presents to the emergency department with worsening chest pain and shortness of breath.  She reports that her chest pain and shortness of breath have been ongoing over the last 6 days.  She was seen by cardiology due to her symptoms and scheduled for a cardioversion in 2 days.  Next dialysis is scheduled for tomorrow.  She is taking Eliquis .   Chest Pain      Prior to Admission medications   Medication Sig Start Date End Date Taking? Authorizing Provider  Accu-Chek Softclix Lancets lancets Use to check blood sugar 3 times daily. 01/01/24   Newlin, Enobong, MD  acetaminophen  (TYLENOL ) 325 MG tablet Take 2 tablets (650 mg total) by mouth every 6 (six) hours as needed for mild pain or moderate pain. 12/07/21   Arrien, Mauricio Daniel, MD  albuterol  (PROVENTIL  HFA) [phone] Base) MCG/ACT inhaler Inhale 1-2 puffs into the lungs every 6 (six) hours as needed for wheezing or shortness of breath. 10/13/22   Celestia Rosaline SQUIBB, NP  amiodarone  (PACERONE ) 200 MG tablet Take 1 tablet (200 mg total) by mouth daily. 11/23/23   Bensimhon, Toribio SAUNDERS, MD  AURYXIA  1 GM 210 MG(Fe) tablet Take 210 mg by mouth 3 (three) times daily.    [provider]  Blood Glucose Monitoring Suppl (ACCU-CHEK GUIDE) w/Device KIT Use to check blood sugar 3 times daily. 01/01/24   Newlin, Enobong, MD  busPIRone  (BUSPAR ) 5 MG tablet TAKE ONE TABLET BY MOUTH TWICE DAILY @9AM -5PM 02/14/24   Celestia Rosaline SQUIBB, NP  diphenhydrAMINE  (BENADRYL ) 25 MG tablet Take 25 mg by mouth every 6 (six) hours as needed for itching. Patient taking differently: Take 25 mg by mouth as needed for itching. 03/31/22    [provider]  DULoxetine  (CYMBALTA ) 60 MG capsule TAKE ONE CAPSULE (60MG  TOTAL) BY MOUTH DAILY AT 9AM 02/29/24   Celestia Rosaline SQUIBB, NP  ELIQUIS  5 MG TABS tablet TAKE ONE TABLET (5MG  TOTAL) BY MOUTH TWICE DAILY @9AM -5PM 09/06/23   Bensimhon, Toribio SAUNDERS, MD  EPINEPHrine  0.3 mg/0.3 mL IJ SOAJ injection Inject 0.3 mg into the muscle as needed for anaphylaxis. 06/25/20   Celestia Rosaline SQUIBB, NP  fluticasone  (FLONASE  ALLERGY RELIEF) 50 MCG/ACT nasal spray Place 2 sprays into both nostrils daily as needed for allergies. Patient taking differently: Place 2 sprays into both nostrils as needed for allergies. 03/31/22   [provider]  glucose blood (ACCU-CHEK GUIDE TEST) test strip Use to check blood sugar 3 times daily. 01/01/24   Newlin, Enobong, MD  insulin  glargine (LANTUS  SOLOSTAR) 100 UNIT/ML Solostar Pen Inject 10 Units into the skin daily. 09/27/23   Celestia Rosaline SQUIBB, NP  insulin  lispro (HUMALOG  KWIKPEN) 100 UNIT/ML KwikPen For blood sugars 0-150 give 0 units of insulin , 151-200 give 2 units of insulin , 201-250 give 4 units, 251-300 give 6 units, 301-350 give 8 units, 351-400 give 10 units,> 400 give 12 units and call M.D. 09/27/23   Newlin, Enobong, MD  lidocaine -prilocaine  (EMLA ) cream Apply 1 Application topically Every Tuesday,Thursday,and Saturday with dialysis. 06/01/22   [provider]  loratadine  (CLARITIN ) 10 MG tablet Take 1 tablet (10 mg total) by mouth every morning. 02/08/24  Celestia Rosaline SQUIBB, NP  Methoxy PEG-Epoetin  Beta (MIRCERA IJ) 50 mcg. 01/29/24 01/27/25  [provider]  metoCLOPramide  (REGLAN ) 5 MG tablet Take 1 tablet (5 mg total) by mouth every 8 (eight) hours as needed for nausea. 07/10/23   Celestia Rosaline SQUIBB, NP  metoprolol  tartrate (LOPRESSOR ) 25 MG tablet Take 1 tablet (25 mg total) by mouth 2 (two) times daily. 01/25/24   Vannie Reche RAMAN, NP  midodrine  (PROAMATINE ) 10 MG tablet Take 1 tablet (10 mg total) by mouth See admin instructions.  Take 10mg  (1 tablet) by mouth on Tuesday's, Thursday's, and Saturday's. 03/30/23   Rojelio Nest, DO  Multiple Vitamins-Minerals (MULTIVITAMIN WOMEN PO) Take 1 tablet by mouth daily.    [provider]  nitroGLYCERIN  (NITROSTAT ) 0.4 MG SL tablet Place 1 tablet (0.4 mg total) under the tongue every 5 (five) minutes x 3 doses as needed for chest pain. 11/08/23   Lonni Slain, MD  pantoprazole  (PROTONIX ) 40 MG tablet Take 1 tablet (40 mg total) by mouth daily. 04/24/23   Celestia Rosaline SQUIBB, NP  polyethylene glycol powder (MIRALAX ) 17 GM/SCOOP powder Take 17 g by mouth daily as needed (constipation.). Patient taking differently: Take 17 g by mouth as needed (constipation.). 06/26/22   [provider]  rosuvastatin  (CRESTOR ) 10 MG tablet Take 10 mg by mouth at bedtime. 02/28/24   [provider]    Allergies: Bee pollen, Bee venom, Sulfa antibiotics, Chlorhexidine , and Iodinated contrast media    Review of Systems  Cardiovascular:  Positive for chest pain.  All other systems reviewed and are negative.   Updated Vital Signs BP 96/74 (BP Location: Right Arm)   Pulse 80   Temp (!) 97.5 F (36.4 C) (Oral)   Resp 16   SpO2 93%   Physical Exam Vitals and nursing note reviewed.  HENT:     Head: Normocephalic.  Cardiovascular:     Rate and Rhythm: Normal rate. Rhythm irregular.     Heart sounds: Normal heart sounds.  Pulmonary:     Breath sounds: Decreased breath sounds present.     Comments: Crackles in her lower lung fields  Musculoskeletal:     Right lower leg: Edema present.     Left lower leg: Edema present.  Skin:    General: Skin is warm.  Neurological:     Mental Status: She is alert and oriented to person, place, and time.     (all labs ordered are listed, but only abnormal results are displayed) Labs Reviewed  CBC - Abnormal; Notable for the following components:      Result Value   RBC 3.24 (*)    Hemoglobin 10.3 (*)    HCT 32.8 (*)     MCV 101.2 (*)    RDW 15.8 (*)    All other components within normal limits  BRAIN NATRIURETIC PEPTIDE - Abnormal; Notable for the following components:   B Natriuretic Peptide 253.5 (*)    All other components within normal limits  COMPREHENSIVE METABOLIC PANEL WITH GFR - Abnormal; Notable for the following components:   Sodium 134 (*)    Chloride 90 (*)    Glucose, Bld 116 (*)    BUN 29 (*)    Creatinine, Ser 8.98 (*)    GFR, Estimated 4 (*)    Anion gap 17 (*)    All other components within normal limits  TROPONIN I (HIGH SENSITIVITY)  TROPONIN I (HIGH SENSITIVITY)  TROPONIN I (HIGH SENSITIVITY)    EKG: EKG Interpretation Date/Time:  Monday March 03 2024 14:24:13 EDT Ventricular Rate:  102 PR Interval:  316 QRS Duration:  88 QT Interval:  408 QTC Calculation: 531 R Axis:   144  Text Interpretation: Atrial fibrillation Possible Right ventricular hypertrophy Prolonged QT Abnormal ECG When compared with ECG of 03-Mar-2024 14:23, PREVIOUS ECG IS PRESENT Confirmed by Doretha Folks ([id number]) on 03/03/2024 3:16:06 PM  Radiology: [facility] Chest 2 View Result Date: 03/03/2024 CLINICAL DATA:  Worsening chest pain and shortness of breath. EXAM: CHEST - 2 VIEW COMPARISON:  Cardiac CT 12/26/2023. Chest radiographs 12/24/2023 and 02/01/2024. FINDINGS: Stable cardiomegaly post median sternotomy, dual valve replacement and left atrial appendage closure. The lungs are clear. No pleural effusion or pneumothorax. The bones appear unremarkable. IMPRESSION: Stable cardiomegaly without evidence of acute cardiopulmonary process. Electronically Signed   By: Elsie Perone M.D.   On: 03/03/2024 15:41     Procedures   Medications Ordered in the ED  aspirin  EC tablet 325 mg (325 mg Oral Given 03/03/24 1905)                                    Medical Decision Making 68 year old female with a past medical history of obesity, end-stage renal failure on hemodialysis and longstanding persistent A-fib who  is scheduled to have a cardioversion in 2 days due to her persistent fatigue, chest pain, shortness of breath in the setting of chronic atrial fibrillation.  Her symptoms have been ongoing for the last 6 days and she has been seen by cardiology to discuss this.  At that time she was found to be in A-fib RVR so they schedule her for a cardioversion later this week.  She reports 10 out of 10 chest pain and worsening shortness of breath.  She is in A-fib but her rate is in the 80s.  Her workup is overall unremarkable with a stable troponin and BNP elevated but appears to be around her baseline.  She is due for dialysis tomorrow.  She does have evidence of fluid overload state with lower extremity edema, crackles in her lung fields, and worsening shortness of breath with exertion.  She does not have any evidence of respiratory distress at this time.  Cardiology consulted regarding the ongoing chest discomfort and plan for cardioversion.  Cardiology agrees that her symptoms may improve after cardioversion, however they cannot guarantee they will be able to perform the cardioversion tomorrow.  Plan to admit her for observation and pain control while cardiology waits for availability to perform the cardioversion.  Once the cardioversion is completed they can evaluate her symptoms to see if she has any improvement when she is back in normal sinus rhythm.  Cardiology agreed to come talk to the patient at bedside.  Dx: Persistent A-fib Acute chest pain Decompensated heart failure End-stage renal failure on hemodialysis  Amount and/or Complexity of Data Reviewed Labs: ordered. Radiology: ordered.  Risk OTC drugs.     Final diagnoses:  None    ED Discharge Orders     None          Matraca Hunkins, DO 03/03/24 2222

## 2024-03-03 NOTE — ED Triage Notes (Signed)
 Pt arrived via [facility] from home for chest pain, shortness of breath x6 days, progressing to 8/10 pain today, stabbing, increasing shortness of breath. Placed on 2L for comfort. PMH CHF, bilateral leg edema, crackles in lower lobes. Pt has schedules cardio for this Wednesday. Alert and oriented. 20g RFA. HR 70-130. BP 156/100, 95% RA, BS 136, RR 22

## 2024-03-03 NOTE — Telephone Encounter (Signed)
 Called [facility] to advise them of ER Refusal

## 2024-03-03 NOTE — Telephone Encounter (Signed)
 Rx has been sent through [facility] to [facility]

## 2024-03-04 ENCOUNTER — Inpatient Hospital Stay (HOSPITAL_COMMUNITY): Admitting: Anesthesiology

## 2024-03-04 ENCOUNTER — Encounter (HOSPITAL_COMMUNITY): Payer: Self-pay | Admitting: Internal Medicine

## 2024-03-04 ENCOUNTER — Encounter (HOSPITAL_COMMUNITY)
Admission: EM | Disposition: A | Discharge status: Home Health | Payer: Self-pay | Source: Home / Self Care | Attending: Internal Medicine

## 2024-03-04 DIAGNOSIS — I2510 Atherosclerotic heart disease of native coronary artery without angina pectoris: Secondary | ICD-10-CM

## 2024-03-04 DIAGNOSIS — I4811 Longstanding persistent atrial fibrillation: Secondary | ICD-10-CM

## 2024-03-04 DIAGNOSIS — I200 Unstable angina: Secondary | ICD-10-CM | POA: Diagnosis not present

## 2024-03-04 DIAGNOSIS — N186 End stage renal disease: Secondary | ICD-10-CM

## 2024-03-04 DIAGNOSIS — I959 Hypotension, unspecified: Secondary | ICD-10-CM

## 2024-03-04 LAB — HEPATITIS B SURFACE ANTIGEN: Hepatitis B Surface Ag: NONREACTIVE

## 2024-03-04 LAB — GLUCOSE, CAPILLARY: Glucose-Capillary: 116 mg/dL — ABNORMAL HIGH (ref 70–99)

## 2024-03-04 SURGERY — CARDIOVERSION (CATH LAB)
Anesthesia: General

## 2024-03-04 MED ORDER — LORATADINE 10 MG PO TABS
10.0000 mg | ORAL_TABLET | Freq: Every morning | ORAL | Status: DC
  Administered 2024-03-04 – 2024-03-05 (×2): 10 mg via ORAL
  Filled 2024-03-04 (×2): qty 1

## 2024-03-04 MED ORDER — DULOXETINE HCL 60 MG PO CPEP
60.0000 mg | ORAL_CAPSULE | Freq: Two times a day (BID) | ORAL | Status: DC
  Administered 2024-03-04 – 2024-03-05 (×4): 60 mg via ORAL
  Filled 2024-03-04 (×2): qty 1
  Filled 2024-03-04 (×2): qty 2

## 2024-03-04 MED ORDER — POLYETHYLENE GLYCOL 3350 17 G PO PACK: 17.0000 g | PACK | Freq: Every day | ORAL | Status: DC | PRN

## 2024-03-04 MED ORDER — ROSUVASTATIN CALCIUM 5 MG PO TABS
10.0000 mg | ORAL_TABLET | Freq: Every day | ORAL | Status: DC
  Administered 2024-03-04 – 2024-03-05 (×2): 10 mg via ORAL
  Filled 2024-03-04 (×2): qty 2

## 2024-03-04 MED ORDER — INSULIN GLARGINE-YFGN 100 UNIT/ML ~~LOC~~ SOLN
10.0000 [IU] | Freq: Every day | SUBCUTANEOUS | Status: DC
  Administered 2024-03-04 – 2024-03-05 (×2): 10 [IU] via SUBCUTANEOUS
  Filled 2024-03-04 (×2): qty 0.1

## 2024-03-04 MED ORDER — NITROGLYCERIN 0.4 MG SL SUBL
0.4000 mg | SUBLINGUAL_TABLET | SUBLINGUAL | Status: DC | PRN
  Administered 2024-03-05 (×3): 0.4 mg via SUBLINGUAL
  Filled 2024-03-04 (×2): qty 1

## 2024-03-04 MED ORDER — FLUTICASONE PROPIONATE 50 MCG/ACT NA SUSP: 2.0000 | Freq: Every day | NASAL | Status: DC | PRN

## 2024-03-04 MED ORDER — SENNOSIDES-DOCUSATE SODIUM 8.6-50 MG PO TABS: 1.0000 | ORAL_TABLET | Freq: Every evening | ORAL | Status: DC | PRN

## 2024-03-04 MED ORDER — FENTANYL CITRATE PF 50 MCG/ML IJ SOSY
25.0000 ug | PREFILLED_SYRINGE | INTRAMUSCULAR | Status: DC | PRN
  Administered 2024-03-04 – 2024-03-05 (×3): 25 ug via INTRAVENOUS
  Filled 2024-03-04 (×3): qty 1

## 2024-03-04 MED ORDER — NALOXONE HCL 0.4 MG/ML IJ SOLN: 0.4000 mg | INTRAMUSCULAR | Status: DC | PRN

## 2024-03-04 MED ORDER — PANTOPRAZOLE SODIUM 40 MG PO TBEC
40.0000 mg | DELAYED_RELEASE_TABLET | Freq: Every day | ORAL | Status: DC
  Administered 2024-03-04 – 2024-03-05 (×2): 40 mg via ORAL
  Filled 2024-03-04 (×2): qty 1

## 2024-03-04 MED ORDER — LIDOCAINE-PRILOCAINE 2.5-2.5 % EX CREA
1.0000 | TOPICAL_CREAM | CUTANEOUS | Status: DC
  Filled 2024-03-04: qty 5

## 2024-03-04 MED ORDER — METOPROLOL TARTRATE 25 MG PO TABS
25.0000 mg | ORAL_TABLET | Freq: Two times a day (BID) | ORAL | Status: DC
  Administered 2024-03-04 (×2): 25 mg via ORAL
  Filled 2024-03-04 (×2): qty 1

## 2024-03-04 MED ORDER — METOCLOPRAMIDE HCL 5 MG PO TABS: 5.0000 mg | ORAL_TABLET | Freq: Three times a day (TID) | ORAL | Status: DC | PRN

## 2024-03-04 MED ORDER — METOPROLOL TARTRATE 5 MG/5ML IV SOLN: 5.0000 mg | INTRAVENOUS | Status: DC | PRN

## 2024-03-04 MED ORDER — IPRATROPIUM-ALBUTEROL 0.5-2.5 (3) MG/3ML IN SOLN: 3.0000 mL | RESPIRATORY_TRACT | Status: DC | PRN

## 2024-03-04 MED ORDER — BUSPIRONE HCL 5 MG PO TABS
5.0000 mg | ORAL_TABLET | Freq: Two times a day (BID) | ORAL | Status: DC
  Administered 2024-03-04 – 2024-03-05 (×4): 5 mg via ORAL
  Filled 2024-03-04 (×4): qty 1

## 2024-03-04 MED ORDER — APIXABAN 5 MG PO TABS
5.0000 mg | ORAL_TABLET | Freq: Two times a day (BID) | ORAL | Status: DC
  Administered 2024-03-04 – 2024-03-05 (×4): 5 mg via ORAL
  Filled 2024-03-04 (×4): qty 1

## 2024-03-04 MED ORDER — SODIUM CHLORIDE 0.9% FLUSH: 3.0000 mL | Freq: Two times a day (BID) | INTRAVENOUS | Status: DC

## 2024-03-04 MED ORDER — ONDANSETRON HCL 4 MG/2ML IJ SOLN
INTRAMUSCULAR | Status: AC
  Administered 2024-03-04: 4 mg via INTRAVENOUS
  Filled 2024-03-04: qty 2

## 2024-03-04 MED ORDER — FERRIC CITRATE 1 GM 210 MG(FE) PO TABS
210.0000 mg | ORAL_TABLET | Freq: Three times a day (TID) | ORAL | Status: DC
  Administered 2024-03-04 – 2024-03-05 (×3): 210 mg via ORAL
  Filled 2024-03-04 (×5): qty 1

## 2024-03-04 MED ORDER — SODIUM CHLORIDE 0.9% FLUSH: 3.0000 mL | INTRAVENOUS | Status: DC | PRN

## 2024-03-04 MED ORDER — MIDODRINE HCL 5 MG PO TABS
10.0000 mg | ORAL_TABLET | ORAL | Status: DC
  Administered 2024-03-04: 10 mg via ORAL
  Filled 2024-03-04: qty 2

## 2024-03-04 MED ORDER — ONDANSETRON HCL 4 MG/2ML IJ SOLN: 4.0000 mg | Freq: Once | INTRAMUSCULAR | Status: AC

## 2024-03-04 MED ORDER — ACETAMINOPHEN 325 MG PO TABS: 650.0000 mg | ORAL_TABLET | Freq: Four times a day (QID) | ORAL | Status: DC | PRN

## 2024-03-04 MED ORDER — DOXERCALCIFEROL 4 MCG/2ML IV SOLN: 3.0000 ug | INTRAVENOUS | Status: DC

## 2024-03-04 MED ORDER — HYDRALAZINE HCL 20 MG/ML IJ SOLN: 10.0000 mg | INTRAMUSCULAR | Status: DC | PRN

## 2024-03-04 NOTE — Plan of Care (Signed)

## 2024-03-04 NOTE — Telephone Encounter (Signed)
 I sent order through [facility] and they stated [facility] is unable to fulfill the order for the following reason: Patient has received same and similar.  Pt received commode on 09/26/19  Should I send to different supplier?

## 2024-03-04 NOTE — Hospital Course (Signed)
 Brief Narrative:   68 y.o. 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  Overnight patient tolerated hemodialysis.  This morning still having some exertional dyspnea but patient is adamant that she would rather go home and go to her regular dialysis tomorrow morning.  Significant other was also present at bedside. Due to soft blood pressure, will uptitrate midodrine . Medically stable for discharge.   Assessment & Plan:  Principal Problem:   Chest pain     Persistent atrial fibrillation - Currently patient is on Eliquis  and metoprolol .  Unable to cardiovert on 7/15 as EKG indicated accelerated junctional rhythm suspicious for sick sinus syndrome.  Procedure was aborted.   CHF reduced EF, 50% -well compensated.  Already on beta-blocker.  Diuresis during HD.     Hypotension - Increase midodrine  10 mg twice daily.  This can be further adjusted outpatient   OSA -cpap at bedtime    DMII -Sliding scale and Accu-Cheks.  Long-acting Semglee .  Adjust as necessary   ESRD on HD - Nephrology following, had hemodialysis overnight 1.4 L removed.  Typically about 3 L removed.  Still having some signs of volume overload but patient would rather go home.  She will resume her hemodialysis tomorrow as outpatient.  Morbid Obesity     DVT prophylaxis: apixaban  (ELIQUIS ) tablet 5 mg   Full Code Family Communication:   Status is: Inpatient Until cleared by cardiology    Subjective: Patient is adamant that she would rather go home despite of having mild exertional dyspnea.  Tells me that this is not too far out from her baseline.  Examination:  General exam: Appears  calm and comfortable  Respiratory system: Bibasilar crackles Cardiovascular system: S1 & S2 heard, RRR. No JVD, murmurs, rubs, gallops or clicks.  1+ bilateral lower extremity pitting edema Gastrointestinal system: Abdomen is nondistended, soft and nontender. No organomegaly or masses felt. Normal bowel sounds heard. Central nervous system: Alert and oriented. No focal neurological deficits. Extremities: Symmetric 5 x 5 power. Skin: No rashes, lesions or ulcers Psychiatry: Judgement and insight appear normal. Mood & affect appropriate.

## 2024-03-04 NOTE — Progress Notes (Signed)
 Rounding Note   Patient Name: [name] Date of Encounter: 03/04/2024  [facility] Cardiologist: Shelda Bruckner, MD   Subjective Feeling a little better.  Mild chest pain persists.  No SOB.  Able to complete HD sessions without problem.  No increased LE edema.  Wondering if she ill go for HD today.    Scheduled Meds:  apixaban   5 mg Oral BID   busPIRone   5 mg Oral BID   DULoxetine   60 mg Oral BID   ferric citrate   210 mg Oral TID WC   insulin  glargine-yfgn  10 Units Subcutaneous Daily   lidocaine -prilocaine   1 Application Topical Q T,Th,Sa-HD   loratadine   10 mg Oral q morning   metoprolol  tartrate  25 mg Oral BID   midodrine   10 mg Oral Q T,Th,Sa-HD   pantoprazole   40 mg Oral Daily   rosuvastatin   10 mg Oral QHS   Continuous Infusions:  PRN Meds: fentaNYL  (SUBLIMAZE ) injection, fluticasone , metoCLOPramide , naLOXone  (NARCAN )  injection, nitroGLYCERIN , polyethylene glycol   Vital Signs  Vitals:   03/04/24 0147 03/04/24 0249 03/04/24 0530 03/04/24 0630  BP: (!) 151/77 (!) 86/73 105/83 (!) 101/52  Pulse:  60 96 92  Resp:  16 19 18   Temp:    98 F (36.7 C)  TempSrc:    Oral  SpO2:  93% 97% 97%   No intake or output data in the 24 hours ending 03/04/24 0938    02/29/2024   10:10 AM 02/01/2024    1:12 PM 01/25/2024   10:01 AM  Last 3 Weights  Weight (lbs) 287 lb 3.2 oz 276 lb 276 lb 12.8 oz  Weight (kg) 130.273 kg 125.193 kg 125.556 kg      Telemetry Atrial fibrillation.  Rate mostly <100 bpm - Personally Reviewed  ECG  Atrial fibrillaton.  Rate 102 bpm.  - Personally Reviewed  Physical Exam  VS:  BP 97/62   Pulse 92   Temp 98 F (36.7 C) (Oral)   Resp 17   SpO2 97%  , BMI There is no height or weight on file to calculate BMI. GENERAL:  Well appearing HEENT: Pupils equal round and reactive, fundi not visualized, oral mucosa unremarkable NECK:  No jugular venous distention, waveform within normal limits, carotid upstroke brisk  and symmetric, no bruits, no thyromegaly LUNGS:  Clear to auscultation bilaterally HEART:  Irregularly irregular.  PMI not displaced or sustained,S1 and S2 within normal limits, no S3, no S4, no clicks, no rubs, no murmurs ABD:  Flat, positive bowel sounds normal in frequency in pitch, no bruits, no rebound, no guarding, no midline pulsatile mass, no hepatomegaly, no splenomegaly EXT:  2 plus pulses throughout, no edema, no cyanosis no clubbing SKIN:  No rashes no nodules NEURO:  Cranial nerves II through XII grossly intact, motor grossly intact throughout PSYCH:  Cognitively intact, oriented to person place and time   Labs High Sensitivity Troponin:   Recent Labs  Lab 03/03/24 1651 03/03/24 1729 03/03/24 1914  TROPONINIHS 8 7 8      Chemistry Recent Labs  Lab 02/29/24 1108 03/03/24 1652  NA 137 134*  K 4.2 3.7  CL 93* 90*  CO2 32* 27  GLUCOSE 119* 116*  BUN 26 29*  CREATININE 6.93* 8.98*  CALCIUM  9.7 9.7  PROT  --  7.8  ALBUMIN   --  3.6  AST  --  16  ALT  --  10  ALKPHOS  --  93  BILITOT  --  0.9  GFRNONAA  --  4*  ANIONGAP  --  17*    Lipids No results for input(s): CHOL, TRIG, HDL, LABVLDL, LDLCALC, CHOLHDL in the last 168 hours.  Hematology Recent Labs  Lab 02/29/24 1108 03/03/24 1651  WBC 6.2 6.0  RBC 3.29* 3.24*  HGB 10.1* 10.3*  HCT 31.9* 32.8*  MCV 97 101.2*  MCH 30.7 31.8  MCHC 31.7 31.4  RDW 14.7 15.8*  PLT 149* 158   Thyroid  No results for input(s): TSH, FREET4 in the last 168 hours.  BNP Recent Labs  Lab 03/03/24 1651  BNP 253.5*    DDimer No results for input(s): DDIMER in the last 168 hours.   Radiology  DG Chest 2 View Result Date: 03/03/2024 CLINICAL DATA:  Worsening chest pain and shortness of breath. EXAM: CHEST - 2 VIEW COMPARISON:  Cardiac CT 12/26/2023. Chest radiographs 12/24/2023 and 02/01/2024. FINDINGS: Stable cardiomegaly post median sternotomy, dual valve replacement and left atrial appendage closure. The  lungs are clear. No pleural effusion or pneumothorax. The bones appear unremarkable. IMPRESSION: Stable cardiomegaly without evidence of acute cardiopulmonary process. Electronically Signed   By: Elsie Perone M.D.   On: 03/03/2024 15:41    Cardiac Studies Echo 09/2023: 1. 3D EF 69%. Left ventricular ejection fraction, by estimation, is 55 to  60%. The left ventricle has normal function. The left ventricle has no  regional wall motion abnormalities. Left ventricular diastolic parameters  are indeterminate.   2. Right ventricular systolic function is mildly reduced. The right  ventricular size is mildly enlarged.   3. Left atrial size was severely dilated.   4. Right atrial size was severely dilated.   5. Post repair with ring Mild residual MR Mean diastolic gradient 8 mmHg  at HR 68 bpm. The mitral valve has been repaired/replaced. No evidence of  mitral valve regurgitation. No evidence of mitral stenosis.   6. Post repair with ring. The tricuspid valve is has been  repaired/replaced. Tricuspid valve regurgitation is moderate.   7. The aortic valve is tricuspid. Aortic valve regurgitation is not  visualized. No aortic stenosis is present.   8. The inferior vena cava is normal in size with greater than 50%  respiratory variability, suggesting right atrial pressure of 3 mmHg.    Cardiac CT-A 12/2023:   IMPRESSION: 1. Coronary calcium  score of 58.9. This was 107 percentile for age and sex matched control.   2. Normal coronary origin with right dominance.   3. CAD-RADS 2. Mild non-obstructive CAD (25-49%). Consider non-atherosclerotic causes of chest pain. Consider preventive therapy and risk factor modification.   4. Total plaque volume 56 mm3 which is 26th percentile for age- and sex-matched controls (calcified plaque 15 mm3; non-calcified plaque 41 mm3,Low attenuation 1 mm3).   5. The pulmonary artery is mildly dilated (3.25 cm).   6. Status post annuloplasty of the mitral and  tricuspid valve, annuloplasty ring well seated.   7. Status post left atrial appendage closure device.   8. Right atrium and right ventricle are both enlarged.    Patient Profile   68 y.o. female with longstanding persistent atrial fibrillation, OSA, DM, s/p mitral and tricuspid valve ring repair, hypertension, CVA, ESRD on HD and morbid obesity admitted with atrial fibrillation with RVR.   Assessment & Plan   # Afib with RVR:  Previously failed MAZE, amiodarone  and dofetilide .  Scheduled for DCCV 7/16 but symptoms were not manageable at home. Multiple prior DCCVs.  Seen by EP and not thought to  be a candidate for AV node ablation and PPM.  Seen in EP clinic 02/29/24 with plans for continuing amiodarone , repeat DCCV and outpatient f/u with Dr. Cindie to again discuss AVN ablation and PPM.  She is readmitted with chest pain 2/2 atrial fibrillation.  We will arrange for DCCV today.  Bp too low to safely do this in the ER without anesthesia.  She hasn't eaten since midnight.  No missed doses of Eliquis .    Informed Consent   Shared Decision Making/Informed Consent The risks (stroke, cardiac arrhythmias rarely resulting in the need for a temporary or permanent pacemaker, skin irritation or burns and complications associated with conscious sedation including aspiration, arrhythmia, respiratory failure and death), benefits (restoration of normal sinus rhythm) and alternatives of a direct current cardioversion were explained in detail to [name] and she agrees to proceed.   # OSA: On CPAP  # Non-obstructive CAD:  # Hyperlipidemia:  Non-obstructive disease on cardiac CT. Thought to be due to her atrial fibrillation.  Continue rosuvastatin .  # Hypotension: Continue midodrine  on HD days.  # ESRD: HD Tu/Th/Sat.     For questions or updates, please contact [facility] Please consult [email or web address] for contact info under     Signed, Annabella Scarce, MD  03/04/2024, 9:38 AM

## 2024-03-04 NOTE — Consult Note (Signed)
 Patient arrived in cath lab for [facility] She is totally asymptomatic BP 114 systolic Telemetry is in regular rhythm  ECG indicates accelerated junctional She is not in afib No indication for cardioversion This indicates sick sinus syndrome as she converts From afib to accelerated junctional  Discussed with Dr Raford Will feed patient  OK for dialysis today  Maude Emmer MD [facility]

## 2024-03-04 NOTE — Interval H&P Note (Signed)
 History and Physical Interval Note:  03/04/2024 12:02 PM  [name]  has presented today for surgery, with the diagnosis of afib.  The various methods of treatment have been discussed with the patient and family. After consideration of risks, benefits and other options for treatment, the patient has consented to  Procedure(s): CARDIOVERSION (N/A) as a surgical intervention.  The patient's history has been reviewed, patient examined, no change in status, stable for surgery.  I have reviewed the patient's chart and labs.  Questions were answered to the patient's satisfaction.     Maude Emmer

## 2024-03-04 NOTE — Progress Notes (Signed)
 [facility] night cross cover note:   I was notified by the patient's RN of the patient's request for pain medication.  I subsequently ordered fentanyl  25 mcg IV every 2 hours as needed for pain for her.     Eva Pore, DO Hospitalist

## 2024-03-04 NOTE — Anesthesia Preprocedure Evaluation (Addendum)
 Anesthesia Evaluation  Patient identified by MRN, date of birth, ID band Patient awake    Reviewed: Allergy & Precautions, H&P , NPO status , Patient's Chart, lab work & pertinent test results  Airway Mallampati: II  TM Distance: >3 FB Neck ROM: Full    Dental no notable dental hx. (+) Partial Lower, Partial Upper, Dental Advisory Given   Pulmonary shortness of breath, asthma , sleep apnea , COPD,  COPD inhaler   Pulmonary exam normal breath sounds clear to auscultation       Cardiovascular hypertension, Pt. on medications and Pt. on home beta blockers +CHF  + dysrhythmias Atrial Fibrillation + Valvular Problems/Murmurs  Rhythm:Irregular Rate:Normal     Neuro/Psych   Anxiety Depression    negative neurological ROS     GI/Hepatic Neg liver ROS,GERD  Medicated,,  Endo/Other  diabetes, Insulin  Dependent  Class 3 obesity  Renal/GU negative Renal ROS  negative genitourinary   Musculoskeletal  (+) Arthritis , Osteoarthritis,    Abdominal   Peds  Hematology  (+) Blood dyscrasia, anemia   Anesthesia Other Findings   Reproductive/Obstetrics negative OB ROS                              Anesthesia Physical Anesthesia Plan  ASA: 3  Anesthesia Plan: General   Post-op Pain Management: Minimal or no pain anticipated   Induction: Intravenous  PONV Risk Score and Plan: 3 and Propofol  infusion and Treatment may vary due to age or medical condition  Airway Management Planned: Natural Airway and Simple Face Mask  Additional Equipment:   Intra-op Plan:   Post-operative Plan:   Informed Consent: I have reviewed the patients History and Physical, chart, labs and discussed the procedure including the risks, benefits and alternatives for the proposed anesthesia with the patient or authorized representative who has indicated his/her understanding and acceptance.     Dental advisory given  Plan  Discussed with: CRNA  Anesthesia Plan Comments: (Case cancelled. Pt in an accelerated junctional rhythm rather than afib.)         Anesthesia Quick Evaluation

## 2024-03-04 NOTE — Consult Note (Addendum)
 [facility] Nephrology Consultation Note    Indication for Consultation:  Management of ESRD/hemodialysis, anemia, hypertension/volume, and secondary hyperparathyroidism.  PCP: Celestia Browning, NP  HPI: [name] is a 68 y.o. female with ESRD on HD TTS, T2DM, COPD, HTN, OSA, and A-fib who was admitted with symptomatic A-fib.   Presented to ED yesterday evening with CP and dyspnea for nearly 1 week, progressively worse with palpitations and fatigue. Had been seen by cardiology clinic on 7/11 - noted to be in A-flutter with RVR at that time, her metoprolol  was increased and she was scheduled for repeat DCCV in the near future, but due to worsened symptoms she presented to ED instead. S/p several cardioversions in the past as well as Watchman device without sustained response. In the ED, she was tachycardic and slightly hypotensive. Labs showed Na 134, K 3.7, CO2 29, Ca 9.7, WBC 6, Hgb 10.3, Plts 158. CXR clear. EKG with A-fib with RVR.  Cardiology consulted - plan is for cardioversion today.  Dialyzes on TTS schedule at [facility] unit. Last HD Saturday 7/12 - she is due for HD today. Uses LUE AVF - no recent issues.  Past Medical History:  Diagnosis Date   Acute on chronic systolic CHF (congestive heart failure) (HCC) 12/21/2020   Allergic rhinitis    Anemia    Arthritis    Asthma    Chronic diastolic CHF (congestive heart failure) (HCC)    Chronic kidney disease    Dialysis Tu/TH/Sa   COPD (chronic obstructive pulmonary disease) (HCC)    COVID-19 03/28/2023   Depression    DM (diabetes mellitus) (HCC)    DVT (deep vein thrombosis) in pregnancy    Gastroenteritis 03/27/2023   GERD (gastroesophageal reflux disease)    HTN (hypertension)    Hyperlipidemia    Obesity    Pneumonia 04/2018   RIGHT LOBE   Sleep apnea    needs to be retested - to get new cpap   Past Surgical History:  Procedure Laterality Date   ABDOMINAL HYSTERECTOMY     AV  FISTULA PLACEMENT Left 03/17/2022   Procedure: LEFT ARM ARTERIOVENOUS (AV) FISTULA CREATION;  Surgeon: Eliza Lonni RAMAN, MD;  Location: [facility] OR;  Service: Vascular;  Laterality: Left;   BIOPSY  01/12/2023   Procedure: BIOPSY;  Surgeon: Rollin Dover, MD;  Location: [facility];  Service: Gastroenterology;;   VASSIE STUDY  11/26/2020   Procedure: BUBBLE STUDY;  Surgeon: Lonni Slain, MD;  Location: [facility] ENDOSCOPY;  Service: Cardiovascular;;   CARDIOVERSION N/A 05/06/2020   Procedure: CARDIOVERSION;  Surgeon: Lonni Slain, MD;  Location: [facility] ENDOSCOPY;  Service: Cardiovascular;  Laterality: N/A;   CARDIOVERSION N/A 11/04/2020   Procedure: CARDIOVERSION;  Surgeon: Pietro Redell RAMAN, MD;  Location: [facility] ENDOSCOPY;  Service: Cardiovascular;  Laterality: N/A;   CARDIOVERSION N/A 02/01/2021   Procedure: CARDIOVERSION;  Surgeon: Cherrie Toribio SAUNDERS, MD;  Location: [facility] ENDOSCOPY;  Service: Cardiovascular;  Laterality: N/A;   CARDIOVERSION N/A 02/09/2021   Procedure: CARDIOVERSION;  Surgeon: Cherrie Toribio SAUNDERS, MD;  Location: [facility] ENDOSCOPY;  Service: Cardiovascular;  Laterality: N/A;   CARDIOVERSION N/A 04/19/2021   Procedure: CARDIOVERSION;  Surgeon: Okey Vina GAILS, MD;  Location: [facility] ENDOSCOPY;  Service: Cardiovascular;  Laterality: N/A;   CARDIOVERSION N/A 11/25/2021   Procedure: CARDIOVERSION;  Surgeon: Cherrie Toribio SAUNDERS, MD;  Location: [facility] ENDOSCOPY;  Service: Cardiovascular;  Laterality: N/A;   CARDIOVERSION N/A 01/31/2022   Procedure: CARDIOVERSION;  Surgeon: Cherrie Toribio SAUNDERS, MD;  Location: [facility] ENDOSCOPY;  Service: Cardiovascular;  Laterality: N/A;   CARDIOVERSION N/A 07/23/2023   Procedure: CARDIOVERSION (CATH LAB);  Surgeon: Francyne Headland, MD;  Location: [facility] INVASIVE CV LAB;  Service: Cardiovascular;  Laterality: N/A;   CARDIOVERSION N/A 12/05/2023   Procedure: CARDIOVERSION;  Surgeon: Cherrie Toribio SAUNDERS, MD;  Location: [facility] INVASIVE CV LAB;  Service: Cardiovascular;  Laterality: N/A;    CATARACT EXTRACTION, BILATERAL     CHOLECYSTECTOMY     COLONOSCOPY WITH PROPOFOL  N/A 03/05/2015   Procedure: COLONOSCOPY WITH PROPOFOL ;  Surgeon: Belvie Just, MD;  Location: [facility] ENDOSCOPY;  Service: Endoscopy;  Laterality: N/A;   DIAGNOSTIC LAPAROSCOPY     ESOPHAGOGASTRODUODENOSCOPY (EGD) WITH PROPOFOL  N/A 01/12/2023   Procedure: ESOPHAGOGASTRODUODENOSCOPY (EGD) WITH PROPOFOL ;  Surgeon: Just Belvie, MD;  Location: [facility] ENDOSCOPY;  Service: Gastroenterology;  Laterality: N/A;   FISTULA SUPERFICIALIZATION Left 03/17/2022   Procedure: FISTULA SUPERFICIALIZATION -LEFT;  Surgeon: Eliza Lonni RAMAN, MD;  Location: [facility] OR;  Service: Vascular;  Laterality: Left;   ingrown hallux Left    IR FLUORO GUIDE CV LINE RIGHT  03/14/2022   IR US  GUIDE VASC ACCESS RIGHT  03/14/2022   KNEE SURGERY     LEFT HEART CATH AND CORONARY ANGIOGRAPHY N/A 12/31/2017   Procedure: LEFT HEART CATH AND CORONARY ANGIOGRAPHY;  Surgeon: Levern Hutching, MD;  Location: [facility] INVASIVE CV LAB;  Service: Cardiovascular;  Laterality: N/A;   MAZE N/A 12/29/2020   Procedure: MAZE;  Surgeon: Kerrin Elspeth BROCKS, MD;  Location: [facility] OR;  Service: Open Heart Surgery;  Laterality: N/A;   MITRAL VALVE REPAIR N/A 12/29/2020   Procedure: MITRAL VALVE REPAIR USING CARBOMEDICS ANNULOFLEX RING SIZE 30;  Surgeon: Kerrin Elspeth BROCKS, MD;  Location: [facility] OR;  Service: Open Heart Surgery;  Laterality: N/A;   REVISON OF ARTERIOVENOUS FISTULA Left 01/05/2023   Procedure: REVISION OF LEFT ARTERIOVENOUS FISTULA WITH INTERPOSITION PTFE GRAFT;  Surgeon: Eliza Lonni RAMAN, MD;  Location: [facility] OR;  Service: Vascular;  Laterality: Left;  ELIQUIS  AND HOLD 48 X HOURS   RIGHT HEART CATH N/A 12/22/2020   Procedure: RIGHT HEART CATH;  Surgeon: Rolan Ezra RAMAN, MD;  Location: [facility] INVASIVE CV LAB;  Service: Cardiovascular;  Laterality: N/A;   RIGHT HEART CATH N/A 01/31/2021   Procedure: RIGHT HEART CATH;  Surgeon: Cherrie Toribio SAUNDERS, MD;  Location: [facility]VAS[facility] CV LAB;   Service: Cardiovascular;  Laterality: N/A;   RIGHT HEART CATH N/A 07/29/2021   Procedure: RIGHT HEART CATH;  Surgeon: Cherrie Toribio SAUNDERS, MD;  Location: [facility] LAB;  Service: Cardiovascular;  Laterality: N/A;   RIGHT HEART CATH N/A 11/24/2021   Procedure: RIGHT HEART CATH;  Surgeon: Cherrie Toribio SAUNDERS, MD;  Location: [facility]VAS[facility] CV LAB;  Service: Cardiovascular;  Laterality: N/A;   RIGHT HEART CATH N/A 02/14/2022   Procedure: RIGHT HEART CATH;  Surgeon: Cherrie Toribio SAUNDERS, MD;  Location: [facility] LAB;  Service: Cardiovascular;  Laterality: N/A;   RIGHT HEART CATH N/A 02/22/2022   Procedure: RIGHT HEART CATH;  Surgeon: Rolan Ezra RAMAN, MD;  Location: [facility] INVASIVE CV LAB;  Service: Cardiovascular;  Laterality: N/A;   TEE WITHOUT CARDIOVERSION N/A 11/26/2020   Procedure: TRANSESOPHAGEAL ECHOCARDIOGRAM (TEE);  Surgeon: Lonni Slain, MD;  Location: [facility] ENDOSCOPY;  Service: Cardiovascular;  Laterality: N/A;   TEE WITHOUT CARDIOVERSION N/A 12/29/2020   Procedure: TRANSESOPHAGEAL ECHOCARDIOGRAM (TEE);  Surgeon: Kerrin Elspeth BROCKS, MD;  Location: [facility] OR;  Service: Open Heart Surgery;  Laterality: N/A;   TEE WITHOUT CARDIOVERSION N/A 01/20/2022   Procedure: TRANSESOPHAGEAL ECHOCARDIOGRAM (TEE);  Surgeon: Bensimhon, Daniel  R, MD;  Location: [facility] ENDOSCOPY;  Service: Cardiovascular;  Laterality: N/A;   TRICUSPID VALVE REPLACEMENT N/A 12/29/2020   Procedure: 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 RING SIZE 34;  Surgeon: Kerrin Elspeth BROCKS, MD;  Location: [facility] OR;  Service: Open Heart Surgery;  Laterality: N/A;   Family History  Problem Relation Age of Onset   Breast cancer Mother    Cancer Mother    Hypertension Mother    Cancer Father    Hypertension Sister    Hypertension Brother    CAD Other    Social History:  reports that she has never smoked. She has never been exposed to tobacco smoke. She has never used smokeless tobacco. She reports that she does not drink alcohol   and does not use drugs.  ROS: As per HPI otherwise negative.  Physical Exam: Vitals:   03/04/24 0530 03/04/24 0630 03/04/24 0900 03/04/24 1208  BP: 105/83 (!) 101/52 97/62 107/67  Pulse: 96 92    Resp: 19 18 17 20   Temp:  98 F (36.7 C)    TempSrc:  Oral    SpO2: 97% 97%  93%     General: Overweight woman, NAD. Garza-Salinas II O2 in place. Head: Normocephalic, atraumatic, sclera non-icteric, mucus membranes are moist. Neck: Supple without lymphadenopathy/masses. JVD not elevated. Lungs: Clear bilaterally to auscultation without wheezes, rales, or rhonchi. Breathing is unlabored. Heart: Irregularly irregular, no murmur Abdomen: Soft, non-tender, non-distended with normoactive bowel sounds.  Musculoskeletal:  Strength and tone appear normal for age. Lower extremities: 1+ BLE edema Neuro: Alert and oriented X 3. Moves all extremities spontaneously. Psych:  Responds to questions appropriately with a normal affect. Dialysis Access: LUE AVF +t/b  Allergies  Allergen Reactions   Bee Pollen Anaphylaxis and Other (See Comments)    UNCONFIRMED, but IS allergic to bee VENOM   Bee Venom Anaphylaxis   Sulfa Antibiotics Itching and Rash   Chlorhexidine      Unknown rxn.   Iodinated Contrast Media Nausea And Vomiting   Prior to Admission medications   Medication Sig Start Date End Date Taking? Authorizing Provider  Accu-Chek Softclix Lancets lancets Use to check blood sugar 3 times daily. 01/01/24   Newlin, Enobong, MD  acetaminophen  (TYLENOL ) 325 MG tablet Take 2 tablets (650 mg total) by mouth every 6 (six) hours as needed for mild pain or moderate pain. 12/07/21   Arrien, Mauricio Daniel, MD  albuterol  (PROVENTIL  HFA) 108 [phone] Base) MCG/ACT inhaler Inhale 1-2 puffs into the lungs every 6 (six) hours as needed for wheezing or shortness of breath. 10/13/22   Celestia Rosaline SQUIBB, NP  amiodarone  (PACERONE ) 200 MG tablet Take 1 tablet (200 mg total) by mouth daily. 11/23/23   Bensimhon, Toribio SAUNDERS, MD  AURYXIA  1  GM 210 MG(Fe) tablet Take 210 mg by mouth 3 (three) times daily.    [provider]  Blood Glucose Monitoring Suppl (ACCU-CHEK GUIDE) w/Device KIT Use to check blood sugar 3 times daily. 01/01/24   Newlin, Enobong, MD  busPIRone  (BUSPAR ) 5 MG tablet TAKE ONE TABLET BY MOUTH TWICE DAILY @9AM -5PM 02/14/24   Celestia Rosaline SQUIBB, NP  diphenhydrAMINE  (BENADRYL ) 25 MG tablet Take 25 mg by mouth every 6 (six) hours as needed for itching. Patient taking differently: Take 25 mg by mouth as needed for itching. 03/31/22   [provider]  DULoxetine  (CYMBALTA ) 60 MG capsule TAKE ONE CAPSULE (60MG  TOTAL) BY MOUTH DAILY AT 9AM 02/29/24   Celestia Rosaline SQUIBB, NP  ELIQUIS  5 MG TABS  tablet TAKE ONE TABLET (5MG  TOTAL) BY MOUTH TWICE DAILY @9AM -5PM 09/06/23   Bensimhon, Toribio SAUNDERS, MD  EPINEPHrine  0.3 mg/0.3 mL IJ SOAJ injection Inject 0.3 mg into the muscle as needed for anaphylaxis. 06/25/20   Celestia Rosaline SQUIBB, NP  fluticasone  (FLONASE  ALLERGY RELIEF) 50 MCG/ACT nasal spray Place 2 sprays into both nostrils daily as needed for allergies. Patient taking differently: Place 2 sprays into both nostrils as needed for allergies. 03/31/22   [provider]  glucose blood (ACCU-CHEK GUIDE TEST) test strip Use to check blood sugar 3 times daily. 01/01/24   Newlin, Enobong, MD  insulin  glargine (LANTUS  SOLOSTAR) 100 UNIT/ML Solostar Pen Inject 10 Units into the skin daily. 09/27/23   Celestia Rosaline SQUIBB, NP  insulin  lispro (HUMALOG  KWIKPEN) 100 UNIT/ML KwikPen For blood sugars 0-150 give 0 units of insulin , 151-200 give 2 units of insulin , 201-250 give 4 units, 251-300 give 6 units, 301-350 give 8 units, 351-400 give 10 units,> 400 give 12 units and call M.D. 09/27/23   Newlin, Enobong, MD  lidocaine -prilocaine  (EMLA ) cream Apply 1 Application topically Every Tuesday,Thursday,and Saturday with dialysis. 06/01/22   [provider]  loratadine  (CLARITIN ) 10 MG tablet Take 1 tablet (10 mg total) by mouth  every morning. 02/08/24   Celestia Rosaline SQUIBB, NP  Methoxy PEG-Epoetin  Beta (MIRCERA IJ) 50 mcg. 01/29/24 01/27/25  [provider]  metoCLOPramide  (REGLAN ) 5 MG tablet Take 1 tablet (5 mg total) by mouth every 8 (eight) hours as needed for nausea. 07/10/23   Celestia Rosaline SQUIBB, NP  metoprolol  tartrate (LOPRESSOR ) 25 MG tablet Take 1 tablet (25 mg total) by mouth 2 (two) times daily. 01/25/24   Vannie Reche RAMAN, NP  midodrine  (PROAMATINE ) 10 MG tablet Take 1 tablet (10 mg total) by mouth See admin instructions. Take 10mg  (1 tablet) by mouth on Tuesday's, Thursday's, and Saturday's. 03/30/23   Rojelio Nest, DO  Multiple Vitamins-Minerals (MULTIVITAMIN WOMEN PO) Take 1 tablet by mouth daily.    [provider]  nitroGLYCERIN  (NITROSTAT ) 0.4 MG SL tablet Place 1 tablet (0.4 mg total) under the tongue every 5 (five) minutes x 3 doses as needed for chest pain. 11/08/23   Lonni Slain, MD  pantoprazole  (PROTONIX ) 40 MG tablet Take 1 tablet (40 mg total) by mouth daily. 04/24/23   Celestia Rosaline SQUIBB, NP  polyethylene glycol powder (MIRALAX ) 17 GM/SCOOP powder Take 17 g by mouth daily as needed (constipation.). Patient taking differently: Take 17 g by mouth as needed (constipation.). 06/26/22   [provider]  rosuvastatin  (CRESTOR ) 10 MG tablet Take 10 mg by mouth at bedtime. 02/28/24   [provider]   Current Facility-Administered Medications  Medication Dose Route Frequency Provider Last Rate Last Admin   [MAR Hold] acetaminophen  (TYLENOL ) tablet 650 mg  650 mg Oral Q6H PRN Amin, Ankit C, MD       [MAR Hold] apixaban  (ELIQUIS ) tablet 5 mg  5 mg Oral BID Thomas, Sara-Maiz A, MD   5 mg at 03/04/24 1043   [MAR Hold] busPIRone  (BUSPAR ) tablet 5 mg  5 mg Oral BID Thomas, Sara-Maiz A, MD   5 mg at 03/04/24 1044   [MAR Hold] DULoxetine  (CYMBALTA ) DR capsule 60 mg  60 mg Oral BID Thomas, Sara-Maiz A, MD   60 mg at 03/04/24 1043   [MAR Hold] fentaNYL  (SUBLIMAZE ) injection  25 mcg  25 mcg Intravenous Q2H PRN Howerter, Justin B, DO   25 mcg at 03/04/24 0844   [MAR Hold] ferric citrate  (AURYXIA )  tablet 210 mg  210 mg Oral TID WC Debby Hitch A, MD   210 mg at 03/04/24 1044   [MAR Hold] fluticasone  (FLONASE ) 50 MCG/ACT nasal spray 2 spray  2 spray Each Nare Daily PRN Debby Hitch LABOR, MD       [facility] Hold] hydrALAZINE  (APRESOLINE ) injection 10 mg  10 mg Intravenous Q4H PRN Amin, Ankit C, MD       [MAR Hold] insulin  glargine-yfgn (SEMGLEE ) injection 10 Units  10 Units Subcutaneous Daily Debby Hitch A, MD   10 Units at 03/04/24 1131   [MAR Hold] ipratropium-albuterol  (DUONEB) 0.5-2.5 (3) MG/3ML nebulizer solution 3 mL  3 mL Nebulization Q4H PRN Amin, Ankit C, MD       [MAR Hold] lidocaine -prilocaine  (EMLA ) cream 1 Application  1 Application Topical Q T,Th,Sa-HD Debby Hitch LABOR, MD       [facility] Hold] loratadine  (CLARITIN ) tablet 10 mg  10 mg Oral q morning Debby Hitch A, MD   10 mg at 03/04/24 1043   [MAR Hold] metoCLOPramide  (REGLAN ) tablet 5 mg  5 mg Oral Q8H PRN Debby Hitch LABOR, MD       [facility] Hold] metoprolol  tartrate (LOPRESSOR ) injection 5 mg  5 mg Intravenous Q4H PRN Amin, Ankit C, MD       [MAR Hold] metoprolol  tartrate (LOPRESSOR ) tablet 25 mg  25 mg Oral BID Debby Hitch A, MD   25 mg at 03/04/24 1043   [MAR Hold] midodrine  (PROAMATINE ) tablet 10 mg  10 mg Oral Q T,Th,Sa-HD Debby Hitch A, MD   10 mg at 03/04/24 1043   [MAR Hold] naloxone  (NARCAN ) injection 0.4 mg  0.4 mg Intravenous PRN Howerter, Justin B, DO       [MAR Hold] nitroGLYCERIN  (NITROSTAT ) SL tablet 0.4 mg  0.4 mg Sublingual Q5 Min x 3 PRN Debby Hitch LABOR, MD       [facility] Hold] pantoprazole  (PROTONIX ) EC tablet 40 mg  40 mg Oral Daily Debby Hitch A, MD   40 mg at 03/04/24 1043   [MAR Hold] polyethylene glycol (MIRALAX  / GLYCOLAX ) packet 17 g  17 g Oral Daily PRN Debby Hitch LABOR, MD       [facility] Hold] rosuvastatin  (CRESTOR ) tablet 10 mg  10 mg Oral QHS Debby Hitch A, MD   10 mg at 03/04/24 0145   [MAR Hold] senna-docusate (Senokot-S) tablet 1 tablet  1 tablet Oral QHS PRN Amin, Ankit C, MD       sodium chloride  flush (NS) 0.9 % injection 3-10 mL  3-10 mL Intravenous Q12H Terra Fairy PARAS, PA-C       sodium chloride  flush (NS) 0.9 % injection 3-10 mL  3-10 mL Intravenous PRN Terra Fairy PARAS, PA-C       Labs: Basic Metabolic Panel: Recent Labs  Lab 02/29/24 1108 03/03/24 1652  NA 137 134*  K 4.2 3.7  CL 93* 90*  CO2 32* 27  GLUCOSE 119* 116*  BUN 26 29*  CREATININE 6.93* 8.98*  CALCIUM  9.7 9.7   Liver Function Tests: Recent Labs  Lab 03/03/24 1652  AST 16  ALT 10  ALKPHOS 93  BILITOT 0.9  PROT 7.8  ALBUMIN  3.6   CBC: Recent Labs  Lab 02/29/24 1108 03/03/24 1651  WBC 6.2 6.0  HGB 10.1* 10.3*  HCT 31.9* 32.8*  MCV 97 101.2*  PLT 149* 158   Studies/Results: DG Chest 2 View Result Date: 03/03/2024 CLINICAL DATA:  Worsening chest pain and shortness of breath. EXAM: CHEST - 2 VIEW  COMPARISON:  Cardiac CT 12/26/2023. Chest radiographs 12/24/2023 and 02/01/2024. FINDINGS: Stable cardiomegaly post median sternotomy, dual valve replacement and left atrial appendage closure. The lungs are clear. No pleural effusion or pneumothorax. The bones appear unremarkable. IMPRESSION: Stable cardiomegaly without evidence of acute cardiopulmonary process. Electronically Signed   By: Elsie Perone M.D.   On: 03/03/2024 15:41   Dialysis Orders: TTS - AF 4hr, 400/600, EDW 123.8kg, 2K/2Ca bath, AVF, heparin  2500 bolus - Mircera 75mcg IV q 2 weeks (last 7/8) - Getting course of IV iron  - Hectoral 3mcg IV q HD  Assessment/Plan:  Symptomatic A-fib: Chest pain, dyspnea, lethargy. Cardiology following, for repeat DCCV today.  ESRD:  Usual TTS schedule - will plan for HD today - suspect will be late evening.  Hypertension/volume: BP low/stable, uses midodrine  with HD for BP support.  Anemia: Hgb 10.3 - not due for ESA yet.  Metabolic bone  disease: Ca ok, Phos pending - continue home meds.  OSA  T2DM  Izetta Boehringer, PA-C 03/04/2024, 12:30 PM  [facility]   Seen and examined independently.  Agree with note and exam as documented above by physician extender and as noted here.  Patient ESRD on hemodialysis TTS.  Presented to the hospital with chest pain and shortness of breath which she stated had been worsening over the past week.  She had been seen by cardiology outpatient and a cardioversion was planned however she ultimately presented to the ER.  Per cardiology charting today she arrived for cardioversion and was noted to not be in afib; had accelerated junctional rhythm and concern for sick sinus syndrome    General adult female in bed in no acute distress, obese habitus  HEENT normocephalic atraumatic extraocular movements intact sclera anicteric Neck supple trachea midline Lungs clear to auscultation bilaterally normal work of breathing at rest on room air  Heart S1S2 no rub Abdomen soft nontender obese habitus Extremities trace to 1+ edema bilateral LE's; no cyanosis or clubbing Psych normal mood and affect Access LUE AVF with bruit and thrill   atrial fibrillation - noted to have converted already today when she had been brought to the cath lab.  Per charting, concern for sick sinus syndrome.  Per cardiology.   ESRD.  TTS schedule.  Anticipate treatment this evening   Hypotension - Note she uses midodrine  pre-HD and this is ordered   Anemia of CKD - acceptable and not yet due for ESA  Metabolic bone disease - resume hectorol    Thank you for the consult. Please do not hesitate to contact me with any questions regarding our patient   Katheryn JAYSON Saba, MD 03/04/2024  8:05 PM

## 2024-03-05 ENCOUNTER — Other Ambulatory Visit (HOSPITAL_COMMUNITY): Payer: Self-pay

## 2024-03-05 ENCOUNTER — Encounter (HOSPITAL_COMMUNITY): Admission: RE | Payer: Self-pay | Source: Home / Self Care

## 2024-03-05 ENCOUNTER — Ambulatory Visit (HOSPITAL_COMMUNITY): Admission: RE | Admit: 2024-03-05 | Source: Home / Self Care | Admitting: Cardiology

## 2024-03-05 DIAGNOSIS — I4819 Other persistent atrial fibrillation: Secondary | ICD-10-CM

## 2024-03-05 DIAGNOSIS — I200 Unstable angina: Secondary | ICD-10-CM | POA: Diagnosis not present

## 2024-03-05 LAB — BASIC METABOLIC PANEL WITH GFR
Anion gap: 11 (ref 5–15)
BUN: 21 mg/dL (ref 8–23)
CO2: 29 mmol/L (ref 22–32)
Calcium: 9.1 mg/dL (ref 8.9–10.3)
Chloride: 94 mmol/L — ABNORMAL LOW (ref 98–111)
Creatinine, Ser: 6.35 mg/dL — ABNORMAL HIGH (ref 0.44–1.00)
GFR, Estimated: 7 mL/min — ABNORMAL LOW (ref >60)
Glucose, Bld: 113 mg/dL — ABNORMAL HIGH (ref 70–99)
Potassium: 3.8 mmol/L (ref 3.5–5.1)
Sodium: 134 mmol/L — ABNORMAL LOW (ref 135–145)

## 2024-03-05 LAB — CBC
HCT: 28.3 % — ABNORMAL LOW (ref 36.0–46.0)
Hemoglobin: 9.2 g/dL — ABNORMAL LOW (ref 12.0–15.0)
MCH: 31.3 pg (ref 26.0–34.0)
MCHC: 32.5 g/dL (ref 30.0–36.0)
MCV: 96.3 fL (ref 80.0–100.0)
Platelets: 138 K/uL — ABNORMAL LOW (ref 150–400)
RBC: 2.94 MIL/uL — ABNORMAL LOW (ref 3.87–5.11)
RDW: 15.7 % — ABNORMAL HIGH (ref 11.5–15.5)
WBC: 6.1 K/uL (ref 4.0–10.5)
nRBC: 0 % (ref 0.0–0.2)

## 2024-03-05 LAB — HEPATITIS B SURFACE ANTIBODY, QUANTITATIVE: Hep B S AB Quant (Post): 62.4 m[IU]/mL

## 2024-03-05 LAB — MAGNESIUM: Magnesium: 2.1 mg/dL (ref 1.7–2.4)

## 2024-03-05 LAB — PHOSPHORUS: Phosphorus: 3.9 mg/dL (ref 2.5–4.6)

## 2024-03-05 SURGERY — CARDIOVERSION (CATH LAB)
Anesthesia: Monitor Anesthesia Care

## 2024-03-05 MED ORDER — DOXERCALCIFEROL 4 MCG/2ML IV SOLN: 3.0000 ug | INTRAVENOUS | Status: DC

## 2024-03-05 MED ORDER — MIDODRINE HCL 5 MG PO TABS: 10.0000 mg | ORAL_TABLET | Freq: Two times a day (BID) | ORAL | Status: DC

## 2024-03-05 MED ORDER — FAMOTIDINE 20 MG PO TABS
20.0000 mg | ORAL_TABLET | Freq: Every day | ORAL | Status: DC
  Administered 2024-03-05: 20 mg via ORAL
  Filled 2024-03-05: qty 1

## 2024-03-05 MED ORDER — MIDODRINE HCL 10 MG PO TABS
10.0000 mg | ORAL_TABLET | Freq: Two times a day (BID) | ORAL | 0 refills | Status: DC
  Filled 2024-03-05: qty 60, 30d supply, fill #0

## 2024-03-05 NOTE — Plan of Care (Signed)
  Problem: Education: Goal: Knowledge of General Education information will improve Description: Including pain rating scale, medication(s)/side effects and non-pharmacologic comfort measures 03/05/2024 1141 by Emil Roselie SAUNDERS, RN Outcome: Adequate for Discharge 03/05/2024 1141 by Emil Roselie SAUNDERS, RN Outcome: Adequate for Discharge   Problem: Health Behavior/Discharge Planning: Goal: Ability to manage health-related needs will improve 03/05/2024 1141 by Emil Roselie SAUNDERS, RN Outcome: Adequate for Discharge 03/05/2024 1141 by Emil Roselie SAUNDERS, RN Outcome: Adequate for Discharge   Problem: Clinical Measurements: Goal: Ability to maintain clinical measurements within normal limits will improve 03/05/2024 1141 by Emil Roselie SAUNDERS, RN Outcome: Adequate for Discharge 03/05/2024 1141 by Emil Roselie SAUNDERS, RN Outcome: Adequate for Discharge Goal: Will remain free from infection 03/05/2024 1141 by Emil Roselie SAUNDERS, RN Outcome: Adequate for Discharge 03/05/2024 1141 by Emil Roselie SAUNDERS, RN Outcome: Adequate for Discharge Goal: Diagnostic test results will improve 03/05/2024 1141 by Emil Roselie SAUNDERS, RN Outcome: Adequate for Discharge 03/05/2024 1141 by Emil Roselie SAUNDERS, RN Outcome: Adequate for Discharge Goal: Respiratory complications will improve 03/05/2024 1141 by Emil Roselie SAUNDERS, RN Outcome: Adequate for Discharge 03/05/2024 1141 by Emil Roselie SAUNDERS, RN Outcome: Adequate for Discharge Goal: Cardiovascular complication will be avoided 03/05/2024 1141 by Emil Roselie SAUNDERS, RN Outcome: Adequate for Discharge 03/05/2024 1141 by Emil Roselie SAUNDERS, RN Outcome: Adequate for Discharge   Problem: Activity: Goal: Risk for activity intolerance will decrease 03/05/2024 1141 by Emil Roselie SAUNDERS, RN Outcome: Adequate for Discharge 03/05/2024 1141 by Emil Roselie SAUNDERS, RN Outcome: Adequate for Discharge   Problem: Nutrition: Goal: Adequate nutrition will be  maintained 03/05/2024 1141 by Emil Roselie SAUNDERS, RN Outcome: Adequate for Discharge 03/05/2024 1141 by Emil Roselie SAUNDERS, RN Outcome: Adequate for Discharge   Problem: Coping: Goal: Level of anxiety will decrease 03/05/2024 1141 by Emil Roselie SAUNDERS, RN Outcome: Adequate for Discharge 03/05/2024 1141 by Emil Roselie SAUNDERS, RN Outcome: Adequate for Discharge   Problem: Elimination: Goal: Will not experience complications related to bowel motility 03/05/2024 1141 by Emil Roselie SAUNDERS, RN Outcome: Adequate for Discharge 03/05/2024 1141 by Emil Roselie SAUNDERS, RN Outcome: Adequate for Discharge Goal: Will not experience complications related to urinary retention 03/05/2024 1141 by Emil Roselie SAUNDERS, RN Outcome: Adequate for Discharge 03/05/2024 1141 by Emil Roselie SAUNDERS, RN Outcome: Adequate for Discharge   Problem: Pain Managment: Goal: General experience of comfort will improve and/or be controlled 03/05/2024 1141 by Emil Roselie SAUNDERS, RN Outcome: Adequate for Discharge 03/05/2024 1141 by Emil Roselie SAUNDERS, RN Outcome: Adequate for Discharge   Problem: Safety: Goal: Ability to remain free from injury will improve 03/05/2024 1141 by Emil Roselie SAUNDERS, RN Outcome: Adequate for Discharge 03/05/2024 1141 by Emil Roselie SAUNDERS, RN Outcome: Adequate for Discharge   Problem: Skin Integrity: Goal: Risk for impaired skin integrity will decrease 03/05/2024 1141 by Emil Roselie SAUNDERS, RN Outcome: Adequate for Discharge 03/05/2024 1141 by Emil Roselie SAUNDERS, RN Outcome: Adequate for Discharge

## 2024-03-05 NOTE — Progress Notes (Signed)
 D/C noted. Contacted [facility] to be advised of pt's d/c today and that pt should resume care tomorrow.   Olivia Canter Renal Navigator [phone]

## 2024-03-05 NOTE — Progress Notes (Signed)
 Pt was back to the unit after getting hemodialysis. Hemodynamically stable. BP 106/80 mmHg, HR 70s-80s. At arrival, Pt had complaints of pressure like chest pain scale 8/10.   Nitroglycerine 0.4 mg sublingual x 2 and Fentanyl  25 mcg given.   After meds given, Pt reported the chest pain was relieved and able to rest well with no complaints. We will continue to monitor.    Wendi Dash, RN

## 2024-03-05 NOTE — Telephone Encounter (Signed)
 Noted

## 2024-03-05 NOTE — Progress Notes (Signed)
 PROGRESS NOTE    [name]  FMW:[id number] DOB: [birth year] DOA: 03/03/2024 PCP: Celestia Rosaline SQUIBB, NP    Brief Narrative:   68 y.o. 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    Assessment & Plan:  Principal Problem:   Chest pain     Persistent atrial fibrillation - Currently patient is on Eliquis  and metoprolol .  Seen by cardiology, planning on cardioversion   CHF reduced EF, 50% -well compensated.  Already on beta-blocker.  Diuresis during HD.     HTN  -Continue metoprolol .  IV as needed   OSA -cpap at bedtime    DMII -Sliding scale and Accu-Cheks.  Long-acting Semglee .  Adjust as necessary   ESRD on HD -Will consult nephrology   Morbid Obesity     DVT prophylaxis: apixaban  (ELIQUIS ) tablet 5 mg   Full Code Family Communication:   Status is: Inpatient Until cleared by cardiology    Subjective: Unable to cardiovert. Doing ok   Examination:  General exam: Appears calm and comfortable  Respiratory system: Clear to auscultation. Respiratory effort normal. Cardiovascular system: S1 & S2 heard, RRR. No JVD, murmurs, rubs, gallops or clicks. No pedal edema. Gastrointestinal system: Abdomen is nondistended, soft and nontender. No organomegaly or masses felt. Normal bowel sounds heard. Central nervous system: Alert and oriented. No focal neurological deficits. Extremities: Symmetric 5 x 5 power. Skin: No rashes, lesions or ulcers Psychiatry: Judgement and insight appear normal. Mood & affect appropriate.                Diet Orders (From admission, onward)     Start     Ordered   03/04/24 1153  Diet  regular Room service appropriate? Yes; Fluid consistency: Thin  Diet effective now       Question Answer Comment  Room service appropriate? Yes   Fluid consistency: Thin      03/04/24 1152            Objective: Vitals:   03/05/24 0024 03/05/24 0201 03/05/24 0221 03/05/24 0420  BP: 105/87 106/80 98/63 106/62  Pulse: 74 77 80 80  Resp: 16 16  15   Temp: 97.7 F (36.5 C) 97.6 F (36.4 C)  97.6 F (36.4 C)  TempSrc: Oral Oral  Oral  SpO2: 100% 98% 98% 97%  Weight:        Intake/Output Summary (Last 24 hours) at 03/05/2024 0831 Last data filed at 03/05/2024 0024 Gross per 24 hour  Intake 250 ml  Output 1400 ml  Net -1150 ml   Filed Weights   03/04/24 2044 03/04/24 2050  Weight: 132.7 kg 132.7 kg    Scheduled Meds:  apixaban   5 mg Oral BID   busPIRone   5 mg Oral BID   [START ON 03/06/2024] doxercalciferol   3 mcg Intravenous Q T,Th,Sa-HD   DULoxetine   60 mg Oral BID   famotidine   20 mg Oral Daily   ferric citrate   210 mg Oral TID WC   insulin  glargine-yfgn  10 Units Subcutaneous Daily   lidocaine -prilocaine   1 Application Topical Q T,Th,Sa-HD   loratadine   10 mg Oral q morning   metoprolol  tartrate  25 mg Oral BID   midodrine   10 mg Oral Q T,Th,Sa-HD   pantoprazole   40 mg Oral Daily   rosuvastatin   10 mg Oral QHS   Continuous Infusions:  Nutritional status     Body mass index is 45.82 kg/m.  Data Reviewed:   CBC: Recent Labs  Lab 02/29/24 1108 03/03/24 1651 03/05/24 0406  WBC 6.2 6.0 6.1  HGB 10.1* 10.3* 9.2*  HCT 31.9* 32.8* 28.3*  MCV 97 101.2* 96.3  PLT 149* 158 138*   Basic Metabolic Panel: Recent Labs  Lab 02/29/24 1108 03/03/24 1652 03/05/24 0406  NA 137 134* 134*  K 4.2 3.7 3.8  CL 93* 90* 94*  CO2 32* 27 29  GLUCOSE 119* 116* 113*  BUN 26 29* 21  CREATININE 6.93* 8.98* 6.35*  CALCIUM  9.7 9.7 9.1  MG  --   --  2.1  PHOS  --   --  3.9   GFR: Estimated Creatinine Clearance: 12.2 mL/min (A) (by C-G formula based on SCr of 6.35  mg/dL (H)). Liver Function Tests: Recent Labs  Lab 03/03/24 1652  AST 16  ALT 10  ALKPHOS 93  BILITOT 0.9  PROT 7.8  ALBUMIN  3.6   No results for input(s): LIPASE, AMYLASE in the last 168 hours. No results for input(s): AMMONIA in the last 168 hours. Coagulation Profile: No results for input(s): INR, PROTIME in the last 168 hours. Cardiac Enzymes: No results for input(s): CKTOTAL, CKMB, CKMBINDEX, TROPONINI in the last 168 hours. BNP (last 3 results) Recent Labs    12/24/23 1004  PROBNP 8,609.0*   HbA1C: No results for input(s): HGBA1C in the last 72 hours. CBG: Recent Labs  Lab 03/04/24 1710  GLUCAP 116*   Lipid Profile: No results for input(s): CHOL, HDL, LDLCALC, TRIG, CHOLHDL, LDLDIRECT in the last 72 hours. Thyroid  Function Tests: No results for input(s): TSH, T4TOTAL, FREET4, T3FREE, THYROIDAB in the last 72 hours. Anemia Panel: No results for input(s): VITAMINB12, FOLATE, FERRITIN, TIBC, IRON , RETICCTPCT in the last 72 hours. Sepsis Labs: No results for input(s): PROCALCITON, LATICACIDVEN in the last 168 hours.  No results found for this or any previous visit (from the past 240 hours).       Radiology Studies: DG Chest 2 View Result Date: 03/03/2024 CLINICAL DATA:  Worsening chest pain and shortness of breath. EXAM: CHEST - 2 VIEW COMPARISON:  Cardiac CT 12/26/2023. Chest radiographs 12/24/2023 and 02/01/2024. FINDINGS: Stable cardiomegaly post median sternotomy, dual valve replacement and left atrial appendage closure. The lungs are clear. No pleural effusion or pneumothorax. The bones appear unremarkable. IMPRESSION: Stable cardiomegaly without evidence of acute cardiopulmonary process. Electronically Signed   By: Elsie Perone M.D.   On: 03/03/2024 15:41           LOS: 2 days   Time spent= 35 mins    Burgess JAYSON Dare, MD [facility]  If 7PM-7AM, please contact  night-coverage  03/05/2024, 8:31 AM

## 2024-03-05 NOTE — Progress Notes (Addendum)
 [facility] Progress Note   Subjective:  Seen in room - HD went fine overnight. Still with a little DOE, but ready to leave the hospital.   Objective Vitals:   03/05/24 0201 03/05/24 0221 03/05/24 0420 03/05/24 0835  BP: 106/80 98/63 106/62 114/76  Pulse: 77 80 80   Resp: 16  15   Temp: 97.6 F (36.4 C)  97.6 F (36.4 C) 97.8 F (36.6 C)  TempSrc: Oral  Oral Oral  SpO2: 98% 98% 97% 97%  Weight:       Physical Exam General: Well appearing, NAD. RA Heart: RRR Lungs: CTA Abdomen: soft Extremities: trace BLE edema Dialysis Access: LUE AVF +t/b  Additional Objective Labs: Basic Metabolic Panel: Recent Labs  Lab 02/29/24 1108 03/03/24 1652 03/05/24 0406  NA 137 134* 134*  K 4.2 3.7 3.8  CL 93* 90* 94*  CO2 32* 27 29  GLUCOSE 119* 116* 113*  BUN 26 29* 21  CREATININE 6.93* 8.98* 6.35*  CALCIUM  9.7 9.7 9.1  PHOS  --   --  3.9   Liver Function Tests: Recent Labs  Lab 03/03/24 1652  AST 16  ALT 10  ALKPHOS 93  BILITOT 0.9  PROT 7.8  ALBUMIN  3.6   CBC: Recent Labs  Lab 02/29/24 1108 03/03/24 1651 03/05/24 0406  WBC 6.2 6.0 6.1  HGB 10.1* 10.3* 9.2*  HCT 31.9* 32.8* 28.3*  MCV 97 101.2* 96.3  PLT 149* 158 138*   Studies/Results: DG Chest 2 View Result Date: 03/03/2024 CLINICAL DATA:  Worsening chest pain and shortness of breath. EXAM: CHEST - 2 VIEW COMPARISON:  Cardiac CT 12/26/2023. Chest radiographs 12/24/2023 and 02/01/2024. FINDINGS: Stable cardiomegaly post median sternotomy, dual valve replacement and left atrial appendage closure. The lungs are clear. No pleural effusion or pneumothorax. The bones appear unremarkable. IMPRESSION: Stable cardiomegaly without evidence of acute cardiopulmonary process. Electronically Signed   By: Elsie Perone M.D.   On: 03/03/2024 15:41   Medications:   apixaban   5 mg Oral BID   busPIRone   5 mg Oral BID   [START ON 03/06/2024] doxercalciferol   3 mcg Intravenous Q T,Th,Sa-HD   DULoxetine   60 mg  Oral BID   famotidine   20 mg Oral Daily   ferric citrate   210 mg Oral TID WC   insulin  glargine-yfgn  10 Units Subcutaneous Daily   lidocaine -prilocaine   1 Application Topical Q T,Th,Sa-HD   loratadine   10 mg Oral q morning   metoprolol  tartrate  25 mg Oral BID   midodrine   10 mg Oral BID   pantoprazole   40 mg Oral Daily   rosuvastatin   10 mg Oral QHS    Dialysis Orders TTS - AF 4hr, 400/600, EDW 123.8kg, 2K/2Ca bath, AVF, heparin  2500 bolus - Mircera 75mcg IV q 2 weeks (last 7/8) - Getting course of IV iron  - Hectoral 3mcg IV q HD   Assessment/Plan:  Symptomatic A-fib: Chest pain, dyspnea, lethargy over the preceding week. Went for DCCV but found to be in accelerated junctional rhythm instead - felt likely sick sinus syndrome. Per notes, possible PPM as outpatient.  ESRD:  Usual TTS schedule - next HD tomorrow. Ok for discharge from renal standpoint.  Hypotension/volume: BP low/stable, uses midodrine  with HD for BP support. Cannot tolerate BB.  Anemia: Hgb 9.2 - not due for ESA yet.  Metabolic bone disease: Ca/Phos ok - continue home meds.  OSA  T2DM     Izetta Boehringer, PA-C 03/05/2024, 11:32 AM  [employer]  Patient seen by extender today and then discharged.  I agree with the plans as above.   Katheryn JAYSON Saba, MD 12:40 PM 03/05/2024

## 2024-03-05 NOTE — Progress Notes (Signed)
 Pt is off the unit, transferred to hemodialysis department. She is stable hemodynamically, afebrile, no acute distress prior transferring. BP 97/65 mmHg, which Pt stated that is her baseline BP, HR 70s-80s, accelerated junctional rhythm on the monitor, MD aware. Report given to Fabian Comrie, HD RN.   Wendi Dash, RN

## 2024-03-05 NOTE — Progress Notes (Signed)
 Pt receives out-pt HD at [facility] [place] on TTS 6:50 am chair time. Will assist as needed.   Olivia Canter Renal Navigator [phone]

## 2024-03-05 NOTE — Progress Notes (Signed)
   03/05/24 0024  Vitals  Temp 97.7 F (36.5 C)  Temp Source Oral  BP 105/87  MAP (mmHg) 94  Pulse Rate 74  ECG Heart Rate 74  Resp 16  Oxygen  Therapy  SpO2 100 %  O2 Device Nasal Cannula  O2 Flow Rate (L/min) 2 L/min  During Treatment Monitoring  Blood Flow Rate (mL/min) 399 mL/min  Arterial Pressure (mmHg) -175.95 mmHg  Venous Pressure (mmHg) 201.81 mmHg  TMP (mmHg) 9.29 mmHg  Ultrafiltration Rate (mL/min) 911 mL/min  Dialysate Flow Rate (mL/min) 299 ml/min  Dialysate Potassium Concentration 3  Dialysate Calcium  Concentration 2.5  Duration of HD Treatment -hour(s) 3 hour(s)  Cumulative Fluid Removed (mL) per Treatment  1400.05  HD Safety Checks Performed Yes  Intra-Hemodialysis Comments Tx completed  Post Treatment  Dialyzer Clearance Lightly streaked  Liters Processed 72  Fluid Removed (mL) 1400 mL  Tolerated HD Treatment Yes  AVG/AVF Arterial Site Held (minutes) 7 minutes  AVG/AVF Venous Site Held (minutes) 7 minutes  Fistula / Graft Left Upper arm  No placement date or time found.   Placed prior to admission: Yes  Orientation: Left  Access Location: Upper arm  Site Condition No complications  Fistula / Graft Assessment Present;Thrill;Bruit  Status Deaccessed  Needle Size 15  Drainage Description None

## 2024-03-05 NOTE — Progress Notes (Signed)
 Heart Failure Navigator Progress Note  Assessed for Heart & Vascular TOC clinic readiness.  Patient does not meet criteria due to ESRD on hemodialysis. NO HF TOC. .   Navigator will sign off at this time.   Stephane Haddock, BSN, Scientist, clinical (histocompatibility and immunogenetics) Only

## 2024-03-05 NOTE — Discharge Summary (Signed)
 Physician Discharge Summary  ARMINDA FOGLIO FMW:[id number] DOB: May 07, 1956 DOA: 03/03/2024  PCP: Celestia Rosaline SQUIBB, NP  Admit date: 03/03/2024 Discharge date: 03/05/2024  Admitted From: Home Disposition: Home  Recommendations for Outpatient Follow-up:  Follow up with PCP in 1-2 weeks Please obtain BMP/CBC in one week your next doctors visit.  Resume outpatient hemodialysis tomorrow morning Increase midodrine  10 mg twice daily.  Further adjust as necessary as outpatient   Discharge Condition: Stable CODE STATUS: Full code Diet recommendation: Heart healthy  Brief/Interim Summary: Brief Narrative:   68 y.o. 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  Overnight patient tolerated hemodialysis.  This morning still having some exertional dyspnea but patient is adamant that she would rather go home and go to her regular dialysis tomorrow morning.  Significant other was also present at bedside. Due to soft blood pressure, will uptitrate midodrine . Medically stable for discharge.   Assessment & Plan:  Principal Problem:   Chest pain     Persistent atrial fibrillation - Currently patient is on Eliquis  and metoprolol .  Unable to cardiovert on 7/15 as EKG indicated accelerated junctional rhythm suspicious for sick sinus syndrome.  Procedure was aborted.   CHF reduced EF, 50% -well compensated.  Already on beta-blocker.  Diuresis during HD.     Hypotension - Increase midodrine  10 mg twice daily.  This can be further adjusted outpatient   OSA -cpap at bedtime    DMII -Sliding scale and Accu-Cheks.  Long-acting Semglee .  Adjust as necessary    ESRD on HD - Nephrology following, had hemodialysis overnight 1.4 L removed.  Typically about 3 L removed.  Still having some signs of volume overload but patient would rather go home.  She will resume her hemodialysis tomorrow as outpatient.  Morbid Obesity     DVT prophylaxis: apixaban  (ELIQUIS ) tablet 5 mg   Full Code Family Communication:   Status is: Inpatient Until cleared by cardiology    Subjective: Patient is adamant that she would rather go home despite of having mild exertional dyspnea.  Tells me that this is not too far out from her baseline.  Examination:  General exam: Appears calm and comfortable  Respiratory system: Bibasilar crackles Cardiovascular system: S1 & S2 heard, RRR. No JVD, murmurs, rubs, gallops or clicks.  1+ bilateral lower extremity pitting edema Gastrointestinal system: Abdomen is nondistended, soft and nontender. No organomegaly or masses felt. Normal bowel sounds heard. Central nervous system: Alert and oriented. No focal neurological deficits. Extremities: Symmetric 5 x 5 power. Skin: No rashes, lesions or ulcers Psychiatry: Judgement and insight appear normal. Mood & affect appropriate.    Discharge Diagnoses:  Principal Problem:   Chest pain Active Problems:   ESRD (end stage renal disease) (HCC)   Hypotension   Longstanding persistent atrial fibrillation (HCC)   CAD in native artery   Persistent atrial fibrillation (HCC)      Discharge Exam: Vitals:   03/05/24 0420 03/05/24 0835  BP: 106/62 114/76  Pulse: 80   Resp: 15   Temp: 97.6 F (36.4 C) 97.8 F (36.6 C)  SpO2: 97% 97%   Vitals:   03/05/24 0201 03/05/24 0221 03/05/24 0420 03/05/24 0835  BP: 106/80 98/63  106/62 114/76  Pulse: 77 80 80   Resp: 16  15   Temp: 97.6 F (36.4 C)  97.6 F (36.4 C) 97.8 F (36.6 C)  TempSrc: Oral  Oral Oral  SpO2: 98% 98% 97% 97%  Weight:          Discharge Instructions   Allergies as of 03/05/2024       Reactions   Bee  Pollen Anaphylaxis, Other (See Comments)   UNCONFIRMED, but IS allergic to bee VENOM   Bee Venom Anaphylaxis   Sulfa Antibiotics Itching, Rash   Chlorhexidine     Unknown rxn.   Iodinated Contrast Media Nausea And Vomiting        Medication List     TAKE these medications    Accu-Chek Guide Test test strip Generic drug: glucose blood Use to check blood sugar 3 times daily.   Accu-Chek Guide w/Device Kit Use to check blood sugar 3 times daily.   Accu-Chek Softclix Lancets lancets Use to check blood sugar 3 times daily.   acetaminophen  325 MG tablet Commonly known as: TYLENOL  Take 2 tablets (650 mg total) by mouth every 6 (six) hours as needed for mild pain or moderate pain.   albuterol  108 (90 Base) MCG/ACT inhaler Commonly known as: Proventil  HFA Inhale 1-2 puffs into the lungs every 6 (six) hours as needed for wheezing or shortness of breath.   amiodarone  200 MG tablet Commonly known as: PACERONE  Take 1 tablet (200 mg total) by mouth daily.   Auryxia  1 GM 210 MG(Fe) tablet Generic drug: ferric citrate  Take 210 mg by mouth 3 (three) times daily.   busPIRone  5 MG tablet Commonly known as: BUSPAR  TAKE ONE TABLET BY MOUTH TWICE DAILY @9AM -5PM   diphenhydrAMINE  25 MG tablet Commonly known as: BENADRYL  Take 25 mg by mouth every 6 (six) hours as needed for itching. What changed: when to take this   doxercalciferol  4 MCG/2ML injection Commonly known as: HECTOROL  Inject 1.5 mLs (3 mcg total) into the vein Every Tuesday,Thursday,and Saturday with dialysis. Start taking on: March 06, 2024   DULoxetine  60 MG capsule Commonly known as: CYMBALTA  TAKE ONE CAPSULE (60MG  TOTAL) BY MOUTH DAILY AT 9AM   Eliquis  5 MG Tabs tablet Generic drug: apixaban  TAKE ONE TABLET (5MG  TOTAL) BY MOUTH TWICE DAILY @9AM -5PM   EPINEPHrine  0.3 mg/0.3 mL Soaj injection Commonly known as: EPI-PEN Inject 0.3 mg into the muscle as needed for anaphylaxis.   Flonase  Allergy Relief 50 MCG/ACT  nasal spray Generic drug: fluticasone  Place 2 sprays into both nostrils daily as needed for allergies. What changed: when to take this   insulin  lispro 100 UNIT/ML KwikPen Commonly known as: HumaLOG  KwikPen For blood sugars 0-150 give 0 units of insulin , 151-200 give 2 units of insulin , 201-250 give 4 units, 251-300 give 6 units, 301-350 give 8 units, 351-400 give 10 units,> 400 give 12 units and call M.D.   Lantus  SoloStar 100 UNIT/ML Solostar Pen Generic drug: insulin  glargine Inject 10 Units into the skin daily.   lidocaine -prilocaine  cream Commonly known as: EMLA  Apply 1 Application topically Every Tuesday,Thursday,and Saturday with dialysis.   loratadine  10 MG tablet Commonly known as: CLARITIN  Take 1 tablet (10 mg total) by mouth every morning.   metoCLOPramide  5 MG tablet Commonly known as: Reglan  Take 1 tablet (5 mg total) by mouth every 8 (eight) hours as needed for nausea.   metoprolol  tartrate 25 MG tablet Commonly known as: LOPRESSOR  Take 1 tablet (25 mg total) by mouth 2 (two) times  daily.   midodrine  10 MG tablet Commonly known as: PROAMATINE  Take 1 tablet (10 mg total) by mouth 2 (two) times daily. What changed:  when to take this additional instructions   MiraLax  17 GM/SCOOP powder Generic drug: polyethylene glycol powder Take 17 g by mouth daily as needed (constipation.). What changed: when to take this   MIRCERA IJ 50 mcg.   MULTIVITAMIN WOMEN PO Take 1 tablet by mouth daily.   nitroGLYCERIN  0.4 MG SL tablet Commonly known as: NITROSTAT  Place 1 tablet (0.4 mg total) under the tongue every 5 (five) minutes x 3 doses as needed for chest pain.   pantoprazole  40 MG tablet Commonly known as: PROTONIX  Take 1 tablet (40 mg total) by mouth daily.   rosuvastatin  10 MG tablet Commonly known as: CRESTOR  Take 10 mg by mouth at bedtime.        Allergies  Allergen Reactions   Bee Pollen Anaphylaxis and Other (See Comments)    UNCONFIRMED, but IS  allergic to bee VENOM   Bee Venom Anaphylaxis   Sulfa Antibiotics Itching and Rash   Chlorhexidine      Unknown rxn.   Iodinated Contrast Media Nausea And Vomiting    You were cared for by a hospitalist during your hospital stay. If you have any questions about your discharge medications or the care you received while you were in the hospital after you are discharged, you can call the unit and asked to speak with the hospitalist on call if the hospitalist that took care of you is not available. Once you are discharged, your primary care physician will handle any further medical issues. Please note that no refills for any discharge medications will be authorized once you are discharged, as it is imperative that you return to your primary care physician (or establish a relationship with a primary care physician if you do not have one) for your aftercare needs so that they can reassess your need for medications and monitor your lab values.  You were cared for by a hospitalist during your hospital stay. If you have any questions about your discharge medications or the care you received while you were in the hospital after you are discharged, you can call the unit and asked to speak with the hospitalist on call if the hospitalist that took care of you is not available. Once you are discharged, your primary care physician will handle any further medical issues. Please note that NO REFILLS for any discharge medications will be authorized once you are discharged, as it is imperative that you return to your primary care physician (or establish a relationship with a primary care physician if you do not have one) for your aftercare needs so that they can reassess your need for medications and monitor your lab values.  Please request your Prim.MD to go over all Hospital Tests and Procedure/Radiological results at the follow up, please get all Hospital records sent to your Prim MD by signing hospital release before  you go home.  Get CBC, CMP, 2 view Chest X ray checked  by Primary MD during your next visit or SNF MD in 5-7 days ( we routinely change or add medications that can affect your baseline labs and fluid status, therefore we recommend that you get the mentioned basic workup next visit with your PCP, your PCP may decide not to get them or add new tests based on their clinical decision)  On your next visit with your primary care physician please Get Medicines reviewed  and adjusted.  If you experience worsening of your admission symptoms, develop shortness of breath, life threatening emergency, suicidal or homicidal thoughts you must seek medical attention immediately by calling 911 or calling your MD immediately  if symptoms less severe.  You Must read complete instructions/literature along with all the possible adverse reactions/side effects for all the Medicines you take and that have been prescribed to you. Take any new Medicines after you have completely understood and accpet all the possible adverse reactions/side effects.   Do not drive, operate heavy machinery, perform activities at heights, swimming or participation in water activities or provide baby sitting services if your were admitted for syncope or siezures until you have seen by Primary MD or a Neurologist and advised to do so again.  Do not drive when taking Pain medications.   Procedures/Studies: DG Chest 2 View Result Date: 03/03/2024 CLINICAL DATA:  Worsening chest pain and shortness of breath. EXAM: CHEST - 2 VIEW COMPARISON:  Cardiac CT 12/26/2023. Chest radiographs 12/24/2023 and 02/01/2024. FINDINGS: Stable cardiomegaly post median sternotomy, dual valve replacement and left atrial appendage closure. The lungs are clear. No pleural effusion or pneumothorax. The bones appear unremarkable. IMPRESSION: Stable cardiomegaly without evidence of acute cardiopulmonary process. Electronically Signed   By: Elsie Perone M.D.   On:  03/03/2024 15:41     The results of significant diagnostics from this hospitalization (including imaging, microbiology, ancillary and laboratory) are listed below for reference.     Microbiology: No results found for this or any previous visit (from the past 240 hours).   Labs: BNP (last 3 results) Recent Labs    11/09/23 1634 02/01/24 1549 03/03/24 1651  BNP 278.7* 131.9* 253.5*   Basic Metabolic Panel: Recent Labs  Lab 02/29/24 1108 03/03/24 1652 03/05/24 0406  NA 137 134* 134*  K 4.2 3.7 3.8  CL 93* 90* 94*  CO2 32* 27 29  GLUCOSE 119* 116* 113*  BUN 26 29* 21  CREATININE 6.93* 8.98* 6.35*  CALCIUM  9.7 9.7 9.1  MG  --   --  2.1  PHOS  --   --  3.9   Liver Function Tests: Recent Labs  Lab 03/03/24 1652  AST 16  ALT 10  ALKPHOS 93  BILITOT 0.9  PROT 7.8  ALBUMIN  3.6   No results for input(s): LIPASE, AMYLASE in the last 168 hours. No results for input(s): AMMONIA in the last 168 hours. CBC: Recent Labs  Lab 02/29/24 1108 03/03/24 1651 03/05/24 0406  WBC 6.2 6.0 6.1  HGB 10.1* 10.3* 9.2*  HCT 31.9* 32.8* 28.3*  MCV 97 101.2* 96.3  PLT 149* 158 138*   Cardiac Enzymes: No results for input(s): CKTOTAL, CKMB, CKMBINDEX, TROPONINI in the last 168 hours. BNP: Invalid input(s): POCBNP CBG: Recent Labs  Lab 03/04/24 1710  GLUCAP 116*   D-Dimer No results for input(s): DDIMER in the last 72 hours. Hgb A1c No results for input(s): HGBA1C in the last 72 hours. Lipid Profile No results for input(s): CHOL, HDL, LDLCALC, TRIG, CHOLHDL, LDLDIRECT in the last 72 hours. Thyroid  function studies No results for input(s): TSH, T4TOTAL, T3FREE, THYROIDAB in the last 72 hours.  Invalid input(s): FREET3 Anemia work up No results for input(s): VITAMINB12, FOLATE, FERRITIN, TIBC, IRON , RETICCTPCT in the last 72 hours. Urinalysis    Component Value Date/Time   COLORURINE AMBER (A) 11/07/2022 1447    APPEARANCEUR CLOUDY (A) 11/07/2022 1447   LABSPEC 1.015 11/07/2022 1447   PHURINE 5.0 11/07/2022 1447   GLUCOSEU  NEGATIVE 11/07/2022 1447   HGBUR SMALL (A) 11/07/2022 1447   BILIRUBINUR small (A) 02/21/2023 1748   KETONESUR trace (5) (A) 02/21/2023 1748   KETONESUR NEGATIVE 11/07/2022 1447   PROTEINUR =100 (A) 02/21/2023 1748   PROTEINUR 100 (A) 11/07/2022 1447   UROBILINOGEN 0.2 02/21/2023 1748   UROBILINOGEN 1.0 10/26/2014 0249   NITRITE Negative 02/21/2023 1748   NITRITE NEGATIVE 11/07/2022 1447   LEUKOCYTESUR Negative 02/21/2023 1748   LEUKOCYTESUR TRACE (A) 11/07/2022 1447   Sepsis Labs Recent Labs  Lab 02/29/24 1108 03/03/24 1651 03/05/24 0406  WBC 6.2 6.0 6.1   Microbiology No results found for this or any previous visit (from the past 240 hours).   Time coordinating discharge:  I have spent 35 minutes face to face with the patient and on the ward discussing the patients care, assessment, plan and disposition with other care givers. >50% of the time was devoted counseling the patient about the risks and benefits of treatment/Discharge disposition and coordinating care.   SIGNED:   Burgess JAYSON Dare, MD  [facility] 03/05/2024, 11:30 AM   If 7PM-7AM, please contact night-coverage

## 2024-03-05 NOTE — Progress Notes (Signed)
 Rounding Note   Patient Name: [name] Date of Encounter: 03/05/2024  [facility] Cardiologist: Shelda Bruckner, MD   Subjective Notes shortness of breath with ambulation.  Chest heaviness improved.   Scheduled Meds:  apixaban   5 mg Oral BID   busPIRone   5 mg Oral BID   [START ON 03/06/2024] doxercalciferol   3 mcg Intravenous Q T,Th,Sa-HD   DULoxetine   60 mg Oral BID   famotidine   20 mg Oral Daily   ferric citrate   210 mg Oral TID WC   insulin  glargine-yfgn  10 Units Subcutaneous Daily   lidocaine -prilocaine   1 Application Topical Q T,Th,Sa-HD   loratadine   10 mg Oral q morning   metoprolol  tartrate  25 mg Oral BID   midodrine   10 mg Oral Q T,Th,Sa-HD   pantoprazole   40 mg Oral Daily   rosuvastatin   10 mg Oral QHS   Continuous Infusions:  PRN Meds: acetaminophen , fentaNYL  (SUBLIMAZE ) injection, fluticasone , hydrALAZINE , ipratropium-albuterol , metoCLOPramide , metoprolol  tartrate, naLOXone  (NARCAN )  injection, nitroGLYCERIN , polyethylene glycol, senna-docusate   Vital Signs  Vitals:   03/05/24 0201 03/05/24 0221 03/05/24 0420 03/05/24 0835  BP: 106/80 98/63 106/62 114/76  Pulse: 77 80 80   Resp: 16  15   Temp: 97.6 F (36.4 C)  97.6 F (36.4 C) 97.8 F (36.6 C)  TempSrc: Oral  Oral Oral  SpO2: 98% 98% 97% 97%  Weight:        Intake/Output Summary (Last 24 hours) at 03/05/2024 1025 Last data filed at 03/05/2024 0024 Gross per 24 hour  Intake 250 ml  Output 1400 ml  Net -1150 ml      03/04/2024    8:50 PM 03/04/2024    8:44 PM 02/29/2024   10:10 AM  Last 3 Weights  Weight (lbs) 292 lb 8.8 oz 292 lb 8.8 oz 287 lb 3.2 oz  Weight (kg) 132.7 kg 132.7 kg 130.273 kg      Telemetry Atrial fibrillation.  Rate mostly <100 bpm - Personally Reviewed  ECG  Atrial fibrillaton.  Rate 102 bpm.  - Personally Reviewed  Physical Exam  VS:  BP 114/76   Pulse 80   Temp 97.8 F (36.6 C) (Oral)   Resp 15   Wt 132.7 kg   SpO2 97%   BMI  45.82 kg/m  , BMI Body mass index is 45.82 kg/m. GENERAL:  Well appearing HEENT: Pupils equal round and reactive, fundi not visualized, oral mucosa unremarkable NECK:  No jugular venous distention, waveform within normal limits, carotid upstroke brisk and symmetric, no bruits, no thyromegaly LUNGS:  Crackles at R base HEART:  RRR.  PMI not displaced or sustained,S1 and S2 within normal limits, no S3, no S4, no clicks, no rubs, no murmurs ABD:  Flat, positive bowel sounds normal in frequency in pitch, no bruits, no rebound, no guarding, no midline pulsatile mass, no hepatomegaly, no splenomegaly EXT:  2 plus pulses throughout, no edema, no cyanosis no clubbing SKIN:  No rashes no nodules NEURO:  Cranial nerves II through XII grossly intact, motor grossly intact throughout PSYCH:  Cognitively intact, oriented to person place and time   Labs High Sensitivity Troponin:   Recent Labs  Lab 03/03/24 1651 03/03/24 1729 03/03/24 1914  TROPONINIHS 8 7 8      Chemistry Recent Labs  Lab 02/29/24 1108 03/03/24 1652 03/05/24 0406  NA 137 134* 134*  K 4.2 3.7 3.8  CL 93* 90* 94*  CO2 32* 27 29  GLUCOSE 119* 116* 113*  BUN  26 29* 21  CREATININE 6.93* 8.98* 6.35*  CALCIUM  9.7 9.7 9.1  MG  --   --  2.1  PROT  --  7.8  --   ALBUMIN   --  3.6  --   AST  --  16  --   ALT  --  10  --   ALKPHOS  --  93  --   BILITOT  --  0.9  --   GFRNONAA  --  4* 7*  ANIONGAP  --  17* 11    Lipids No results for input(s): CHOL, TRIG, HDL, LABVLDL, LDLCALC, CHOLHDL in the last 168 hours.  Hematology Recent Labs  Lab 02/29/24 1108 03/03/24 1651 03/05/24 0406  WBC 6.2 6.0 6.1  RBC 3.29* 3.24* 2.94*  HGB 10.1* 10.3* 9.2*  HCT 31.9* 32.8* 28.3*  MCV 97 101.2* 96.3  MCH 30.7 31.8 31.3  MCHC 31.7 31.4 32.5  RDW 14.7 15.8* 15.7*  PLT 149* 158 138*   Thyroid  No results for input(s): TSH, FREET4 in the last 168 hours.  BNP Recent Labs  Lab 03/03/24 1651  BNP 253.5*    DDimer No  results for input(s): DDIMER in the last 168 hours.   Radiology  DG Chest 2 View Result Date: 03/03/2024 CLINICAL DATA:  Worsening chest pain and shortness of breath. EXAM: CHEST - 2 VIEW COMPARISON:  Cardiac CT 12/26/2023. Chest radiographs 12/24/2023 and 02/01/2024. FINDINGS: Stable cardiomegaly post median sternotomy, dual valve replacement and left atrial appendage closure. The lungs are clear. No pleural effusion or pneumothorax. The bones appear unremarkable. IMPRESSION: Stable cardiomegaly without evidence of acute cardiopulmonary process. Electronically Signed   By: Elsie Perone M.D.   On: 03/03/2024 15:41    Cardiac Studies Echo 09/2023: 1. 3D EF 69%. Left ventricular ejection fraction, by estimation, is 55 to  60%. The left ventricle has normal function. The left ventricle has no  regional wall motion abnormalities. Left ventricular diastolic parameters  are indeterminate.   2. Right ventricular systolic function is mildly reduced. The right  ventricular size is mildly enlarged.   3. Left atrial size was severely dilated.   4. Right atrial size was severely dilated.   5. Post repair with ring Mild residual MR Mean diastolic gradient 8 mmHg  at HR 68 bpm. The mitral valve has been repaired/replaced. No evidence of  mitral valve regurgitation. No evidence of mitral stenosis.   6. Post repair with ring. The tricuspid valve is has been  repaired/replaced. Tricuspid valve regurgitation is moderate.   7. The aortic valve is tricuspid. Aortic valve regurgitation is not  visualized. No aortic stenosis is present.   8. The inferior vena cava is normal in size with greater than 50%  respiratory variability, suggesting right atrial pressure of 3 mmHg.    Cardiac CT-A 12/2023:   IMPRESSION: 1. Coronary calcium  score of 58.9. This was 83 percentile for age and sex matched control.   2. Normal coronary origin with right dominance.   3. CAD-RADS 2. Mild non-obstructive CAD (25-49%).  Consider non-atherosclerotic causes of chest pain. Consider preventive therapy and risk factor modification.   4. Total plaque volume 56 mm3 which is 26th percentile for age- and sex-matched controls (calcified plaque 15 mm3; non-calcified plaque 41 mm3,Low attenuation 1 mm3).   5. The pulmonary artery is mildly dilated (3.25 cm).   6. Status post annuloplasty of the mitral and tricuspid valve, annuloplasty ring well seated.   7. Status post left atrial appendage closure device.  8. Right atrium and right ventricle are both enlarged.    Patient Profile   68 y.o. female with longstanding persistent atrial fibrillation, OSA, DM, s/p mitral and tricuspid valve ring repair, hypertension, CVA, ESRD on HD and morbid obesity admitted with atrial fibrillation with RVR.   Assessment & Plan   # Afib with RVR:  Previously failed MAZE, amiodarone  and dofetilide .  Scheduled for DCCV 7/16 but symptoms were not manageable at home. Multiple prior DCCVs.  Seen by EP and not thought to be a candidate for AV node ablation and PPM.  Seen in EP clinic 02/29/24 with plans for continuing amiodarone , repeat DCCV and outpatient f/u with Dr. Cindie to again discuss AVN ablation and PPM.  She is readmitted with chest pain 2/2 atrial fibrillation.  She underwent DCCV 7/15 which was successful.  Junctional rhythm but rate in the 60s.  Now maintaining sinus rhythm.  Continue amiodarone  and Eliquis .  BP too low for further titration of metoprolol .  Planning for outpatient discussion on AV node ablation and PPM, which is not ideal, but may be her only option to avoid repeat admissions.   # OSA: On CPAP  # Non-obstructive CAD:  # Hyperlipidemia:  Non-obstructive disease on cardiac CT. Thought to be due to her atrial fibrillation.  Continue rosuvastatin .  # Hypotension: Continue midodrine  on HD days.  # ESRD: HD Tu/Th/Sat.  Reports residual SOB today.  Only had 1.4L removed yesterday.  She usually has 3-4L removed.   She has crackles and elevated JVP on exam.  Consider additional volume removal prior to discharge.  Consider higher midodrine  doses or dosing on non-HD days to allow for more volume removal.  [facility] will sign off.   Medication Recommendations:  consider higher dose of midodrine  Other recommendations (labs, testing, etc):  consider AV node ablation and PPM as outpatient Follow up as an outpatient:  we will arrange    For questions or updates, please contact [facility] Please consult [email or web address] for contact info under     Signed, Annabella Scarce, MD  03/05/2024, 10:25 AM

## 2024-03-05 NOTE — Discharge Planning (Signed)
 [facility] Patient Dialysis Discharge Orders - [facility]: AF  Patient's name: PARISSA CHIAO Admit/DC Dates: 03/03/2024 - 03/05/24  DISCHARGE DIAGNOSES: Symptomatic A-fib, transitioned to accelerated junctional rhythm --> DCCV cancelled, plan is pacemaker as outpatient ESRD Hypotension  HD ORDER CHANGES: Heparin  change: no EDW Change: no **bed weights way off here** Bath Change: no  ANEMIA MANAGEMENT: Aranesp : Given: no ESA dose for discharge: mircera 75 mcg IV q 2 weeks, to start on 7/22 IV Iron  dose at discharge: per protocol Transfusion: Given: no  BONE/MINERAL MEDICATIONS: Hectorol /Calcitriol change: no Sensipar/Parsabiv change: no  ACCESS INTERVENTION/CHANGE: no Details:  RECENT LABS: Recent Labs  Lab 03/03/24 1652 03/05/24 0406  HGB  --  9.2*  NA 134* 134*  K 3.7 3.8  CALCIUM  9.7 9.1  PHOS  --  3.9  ALBUMIN  3.6  --     IV ANTIBIOTICS: no  OTHER ANTICOAGULATION: On Eliquis  chronically  OTHER/APPTS/LAB ORDERS:  - Check K weekly twice -- lower end this admit, may need 3K bath.  D/C Meds to be reconciled by nurse after every discharge.  Completed By: Izetta Boehringer, PA-C [facility] Pager [phone]   Reviewed by: MD:______ RN_______

## 2024-03-05 NOTE — TOC Initial Note (Signed)
 Transition of Care [facility]) - Initial/Assessment Note    Patient Details  Name: KALY MCQUARY MRN: [id number] Date of Birth: Aug 14, 1956  Transition of Care [facility]) CM/SW Contact:    Sudie Erminio Deems, RN Phone Number: 03/05/2024, 12:28 PM  Clinical Narrative:  Risk for readmission assessment completed. PTA patient was from home with support of daughter and son. Patient has DME rolling walker in the home. Patient is currently active with [facility] and MD is aware to place orders for [facility] RN/PT/OT. Patient reports that she has transportation to HD in the morning at 5:45 am and that she will have transportation home. No further needs identified at this time.               Expected Discharge Plan: Home w Home Health Services Barriers to Discharge: No Barriers Identified   Patient Goals and CMS Choice Patient states their goals for this hospitalization and ongoing recovery are:: plan to return home with home health services          Expected Discharge Plan and Services In-house Referral: NA Discharge Planning Services: CM Consult Post Acute Care Choice: Home Health, Resumption of Svcs/PTA Provider Living arrangements for the past 2 months: Apartment Expected Discharge Date: 03/05/24                 DME Agency: NA       HH Arranged: RN, Disease Management, OT HH Agency: [name] Date [facility] Agency Contacted: 03/05/24 Time HH Agency Contacted: 1227 Representative spoke with at [facility] Agency: [place]  Prior Living Arrangements/Services Living arrangements for the past 2 months: Apartment Lives with:: Self Patient language and need for interpreter reviewed:: Yes Do you feel safe going back to the place where you live?: Yes      Need for Family Participation in Patient Care: No (Comment) Care giver support system in place?: No (comment)   Criminal Activity/Legal Involvement Pertinent to Current Situation/Hospitalization: No - Comment as  needed  Activities of Daily Living   ADL Screening (condition at time of admission) Independently performs ADLs?: Yes (appropriate for developmental age) Is the patient deaf or have difficulty hearing?: No Does the patient have difficulty seeing, even when wearing glasses/contacts?: No Does the patient have difficulty concentrating, remembering, or making decisions?: No  Permission Sought/Granted Permission sought to share information with : Case Manager, Family Supports, Oceanographer granted to share information with : Yes, Verbal Permission Granted     Permission granted to share info w AGENCY: [facility]        Emotional Assessment Appearance:: Appears stated age Attitude/Demeanor/Rapport: Engaged Affect (typically observed): Appropriate Orientation: : Oriented to Self, Oriented to Place, Oriented to  Time, Oriented to Situation Alcohol  / Substance Use: Not Applicable Psych Involvement: No (comment)  Admission diagnosis:  Chest pain [R07.9] Chest pain, unspecified type [R07.9] Patient Active Problem List   Diagnosis Date Noted   Persistent atrial fibrillation (HCC) 03/05/2024   CAD in native artery 03/04/2024   Costochondritis 12/27/2023   Precordial chest pain 12/27/2023   Atrial fibrillation with RVR (HCC) 11/09/2023   HLD (hyperlipidemia) 11/09/2023   Longstanding persistent atrial fibrillation (HCC) 07/16/2023   Encounter for monitoring amiodarone  therapy 07/16/2023   Dyspnea 07/03/2023   Vertigo 03/17/2023   Hypotension 03/14/2023   Left sided numbness 03/14/2023   Screening for malignant neoplasm of colon 02/21/2023   Nausea and vomiting 02/21/2023   HCAP (healthcare-associated pneumonia) 06/21/2022   ESRD (end stage renal disease) (HCC) 06/21/2022  OSA on CPAP 06/21/2022   Class 3 obesity 03/18/2022   Chronic deep vein thrombosis (DVT) of other vein of right upper extremity (HCC) 12/29/2021   Abnormal weight loss 11/16/2021    History of colonic polyps 11/16/2021   Rib pain 11/16/2021   Anxiety and depression 08/26/2021   Essential hypertension 08/26/2021   GERD (gastroesophageal reflux disease) 08/26/2021   COPD (chronic obstructive pulmonary disease) (HCC) 07/29/2021   QT prolongation 07/29/2021   Chronic diastolic CHF (congestive heart failure) (HCC)    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   Teeth missing    Gingivitis    Accretions on teeth    Atrial fibrillation, permanent (HCC) 11/02/2020   NICM (nonischemic cardiomyopathy) (HCC) 04/01/2020   Family history of heart disease 04/01/2020   Mitral regurgitation 04/01/2020   Chest pain 02/26/2012   Type 2 diabetes mellitus with hyperlipidemia (HCC) 02/04/2010   Gastroparesis 02/04/2010   PCP:  Celestia Rosaline SQUIBB, NP Pharmacy:   [facility] - [place], [place] - [address] [place] [address] Phone: [phone] Fax: [phone]  [facility] [place], [place] - [address] [place] [address] Phone: [phone] Fax: [phone]  [facility] [address] [place] [address] Phone: [phone] Fax: [phone]     Social Drivers of Health (SDOH) Social History: SDOH Screenings   Food Insecurity: No Food Insecurity (03/04/2024)  Housing: Low Risk  (03/04/2024)  Transportation Needs: No Transportation Needs (03/04/2024)  Utilities: Not At Risk (03/04/2024)  Alcohol  Screen: Low Risk  (06/04/2023)  Depression (PHQ2-9): Medium Risk (11/28/2023)  Financial Resource Strain: Low Risk  (11/19/2023)  Physical Activity: Insufficiently Active (06/04/2023)  Social Connections: Moderately Isolated (03/04/2024)  Stress: No Stress Concern Present (06/04/2023)  Tobacco Use: Low Risk  (03/04/2024)  Health Literacy: Patient Declined (06/04/2023)   SDOH Interventions:     Readmission Risk  Interventions    03/05/2024   12:22 PM 11/12/2023    4:32 PM 03/16/2023    3:57 PM  Readmission Risk Prevention Plan  Transportation Screening Complete Complete Complete  PCP or Specialist Appt within 3-5 Days Complete    HRI or Home Care Consult Complete Complete   Social Work Consult for Recovery Care Planning/Counseling Complete Complete   Palliative Care Screening Not Applicable Not Applicable   Medication Review Oceanographer) Referral to Pharmacy Referral to Pharmacy Referral to Pharmacy  PCP or Specialist appointment within 3-5 days of discharge   Complete  HRI or Home Care Consult   Complete  SW Recovery Care/Counseling Consult   Complete  Palliative Care Screening   Not Applicable  Skilled Nursing Facility   Not Applicable

## 2024-03-06 ENCOUNTER — Other Ambulatory Visit (INDEPENDENT_AMBULATORY_CARE_PROVIDER_SITE_OTHER): Payer: Self-pay | Admitting: Primary Care

## 2024-03-06 ENCOUNTER — Telehealth (HOSPITAL_COMMUNITY): Payer: Self-pay | Admitting: Nephrology

## 2024-03-06 ENCOUNTER — Telehealth (INDEPENDENT_AMBULATORY_CARE_PROVIDER_SITE_OTHER): Payer: Self-pay | Admitting: Primary Care

## 2024-03-06 ENCOUNTER — Telehealth: Payer: Self-pay | Admitting: *Deleted

## 2024-03-06 DIAGNOSIS — E1129 Type 2 diabetes mellitus with other diabetic kidney complication: Secondary | ICD-10-CM | POA: Diagnosis not present

## 2024-03-06 DIAGNOSIS — D631 Anemia in chronic kidney disease: Secondary | ICD-10-CM | POA: Diagnosis not present

## 2024-03-06 DIAGNOSIS — Z992 Dependence on renal dialysis: Secondary | ICD-10-CM | POA: Diagnosis not present

## 2024-03-06 DIAGNOSIS — D508 Other iron deficiency anemias: Secondary | ICD-10-CM | POA: Diagnosis not present

## 2024-03-06 DIAGNOSIS — N186 End stage renal disease: Secondary | ICD-10-CM | POA: Diagnosis not present

## 2024-03-06 DIAGNOSIS — D689 Coagulation defect, unspecified: Secondary | ICD-10-CM | POA: Diagnosis not present

## 2024-03-06 DIAGNOSIS — N2581 Secondary hyperparathyroidism of renal origin: Secondary | ICD-10-CM | POA: Diagnosis not present

## 2024-03-06 NOTE — Telephone Encounter (Unsigned)
 Copied from CRM [id number]. Topic: Clinical - Medication Refill >> Mar 06, 2024  9:56 AM DeAngela L wrote: Medication: loratadine  (CLARITIN ) 10 MG tablet   Has the patient contacted their pharmacy? Yes  (Agent: If no, request that the patient contact the pharmacy for the refill. If patient does not wish to contact the pharmacy document the reason why and proceed with request.) (Agent: If yes, when and what did the pharmacy advise?)  This is the patient's preferred pharmacy:  [facility] [place] [address] [place] [address] Phone: [phone] Fax: [phone]  Is this the correct pharmacy for this prescription? Yes  If no, delete pharmacy and type the correct one.   Has the prescription been filled recently? Yes   Is the patient out of the medication? Unknown RX pharmacy called in the prescription   Has the patient been seen for an appointment in the last year OR does the patient have an upcoming appointment? Yes   Can we respond through MyChart? Unknown   Agent: Please be advised that Rx refills may take up to 3 business days. We ask that you follow-up with your pharmacy.

## 2024-03-06 NOTE — Telephone Encounter (Signed)
 Copied from CRM [id number]. Topic: Clinical - Home Health Verbal Orders >> Mar 06, 2024  4:39 PM DeAngela L wrote: Caller/Agency: Zacharia Occupational Therapist with [facility] home health  Callback Number: [phone] secured line  Service Requested: Occupational Therapy Frequency: no frequency at this time, this call was to give an update on the patients concerns  Any new concerns about the patient? Yes, On 03/03/24 the patient had chest tightness medical help came and the patient is currently in the hospital, and she had a fall last Friday 02/29/24 getting in the car had a bruise on her right lower extremity and she didn't hit her head and she didn't seek medical attention cause she believed she was ok

## 2024-03-06 NOTE — Transitions of Care (Post Inpatient/ED Visit) (Signed)
   03/06/2024  Name: [name] MRN: [id number] DOB: May 14, 1956  Today's TOC FU Call Status: Today's TOC FU Call Status:: Successful TOC FU Call Completed TOC FU Call Complete Date: 03/06/24 Patient's Name and Date of Birth confirmed.  Transition Care Management Follow-up Telephone Call Date of Discharge: 03/05/24 Discharge Facility: [facility]) Type of Discharge: Inpatient Admission Primary Inpatient Discharge Diagnosis:: Chest pain How have you been since you were released from the hospital?: Better Any questions or concerns?: No  Items Reviewed: Did you receive and understand the discharge instructions provided?: Yes Medications obtained,verified, and reconciled?: No Medications Not Reviewed Reasons:: Other: (Patient is tired from HD this morning and declined medication review) Any new allergies since your discharge?: No Dietary orders reviewed?: Yes Type of Diet Ordered:: Heart Healthy, carb modified Do you have support at home?: Yes People in Home [RPT]: child(ren), adult Name of Support/Comfort Primary Source: Daughter/Samiaya  Medications Reviewed Today:Patient was tired from HD today and declined medication review Medications Reviewed Today   Medications were not reviewed in this encounter     Home Care and Equipment/Supplies: Were Home Health Services Ordered?: Yes Name of Home Health Agency:: [facility] for [facility] RN/PT/OT Has Agency set up a time to come to your home?: No EMR reviewed for Home Health Orders: Orders present/patient has not received call (refer to CM for follow-up) (RNCM contacted [facility] [phone], SOC planned for 03/08/24) Any new equipment or medical supplies ordered?: No  Functional Questionnaire: Do you need assistance with bathing/showering or dressing?: No Do you need assistance with meal preparation?: No Do you need assistance with eating?: No Do you have difficulty maintaining continence: No Do you need assistance with  getting out of bed/getting out of a chair/moving?: No Do you have difficulty managing or taking your medications?: No  Follow up appointments reviewed: PCP Follow-up appointment confirmed?: No (Patient prefers to call and schedule hospital follow up with PCP) MD Provider Line Number:[phone] Given: No Specialist Hospital Follow-up appointment confirmed?: NA Do you need transportation to your follow-up appointment?: No  SDOH Interventions Today    Flowsheet Row Most Recent Value  SDOH Interventions   Food Insecurity Interventions Intervention Not Indicated  Housing Interventions Intervention Not Indicated  Transportation Interventions Intervention Not Indicated  Utilities Interventions Intervention Not Indicated    Andrea Dimes RN, BSN [place]  [facility] RN Care Manager [phone]

## 2024-03-06 NOTE — Telephone Encounter (Signed)
 Transition of care contact from inpatient facility  Date of Discharge: 03/05/24 Date of Contact:03/06/24 - attempt #1 Method of contact: Phone  Attempted to contact patient to discuss transition of care from inpatient admission. Patient did not answer the phone. Message was left on the patient's voicemail with call back number [phone].  Izetta Boehringer, PA-C [employer] Pager [phone]

## 2024-03-07 ENCOUNTER — Other Ambulatory Visit (INDEPENDENT_AMBULATORY_CARE_PROVIDER_SITE_OTHER): Payer: Self-pay | Admitting: Primary Care

## 2024-03-07 NOTE — Telephone Encounter (Signed)
 Will forward to provider

## 2024-03-07 NOTE — Telephone Encounter (Signed)
 Requested medications are due for refill today.  yes  Requested medications are on the active medications list.  yes  Last refill. 02/08/2024 #30 0 rf  Future visit scheduled.   yes  Notes to clinic.  Abnormal labs.    Requested Prescriptions  Pending Prescriptions Disp Refills   loratadine  (CLARITIN ) 10 MG tablet 30 tablet 0    Sig: Take 1 tablet (10 mg total) by mouth every morning.     Ear, Nose, and Throat:  Antihistamines 2 Failed - 03/07/2024 12:02 PM      Failed - Cr in normal range and within 360 days    Creatinine, Ser  Date Value Ref Range Status  03/05/2024 6.35 (H) 0.44 - 1.00 mg/dL Final   Creatinine, Urine  Date Value Ref Range Status  07/29/2021 97.28 mg/dL Final    Comment:    Performed at [facility], [address], [place], [place] [address]         Passed - Valid encounter within last 12 months    Recent Outpatient Visits           3 months ago Generalized anxiety disorder   [place] [facility] Celestia Rosaline SQUIBB, NP   3 months ago Cough productive of purulent sputum   [place] [facility] Celestia Rosaline SQUIBB, NP   1 year ago Generalized anxiety disorder   [place] [facility] Celestia Rosaline SQUIBB, NP   1 year ago Diabetes mellitus type 2 in obese [facility])   [place] [facility] Celestia Rosaline SQUIBB, NP   1 year ago Diabetes mellitus type 2 in obese [facility])   [facility] Delbert Clam, MD       Future Appointments             In 2 months Lonni Slain, MD [facility]

## 2024-03-07 NOTE — Telephone Encounter (Signed)
 Will forward to provider. Drug interaction

## 2024-03-07 NOTE — Telephone Encounter (Signed)
 Called [facility] and spoke with Vertell and made her aware that I had submitted rx through [facility] to [facility] and they are unable to fulfill order because pt had the same and similar commode received in 09/26/2019. Made Vertell aware that I can try another agency. Vertell stated it's okay cause they all the bill the same pt will mostly likely have to purchase out of pocket.  No other questions or concerns

## 2024-03-08 DIAGNOSIS — Z992 Dependence on renal dialysis: Secondary | ICD-10-CM | POA: Diagnosis not present

## 2024-03-08 DIAGNOSIS — D631 Anemia in chronic kidney disease: Secondary | ICD-10-CM | POA: Diagnosis not present

## 2024-03-08 DIAGNOSIS — D508 Other iron deficiency anemias: Secondary | ICD-10-CM | POA: Diagnosis not present

## 2024-03-08 DIAGNOSIS — N2581 Secondary hyperparathyroidism of renal origin: Secondary | ICD-10-CM | POA: Diagnosis not present

## 2024-03-08 DIAGNOSIS — N186 End stage renal disease: Secondary | ICD-10-CM | POA: Diagnosis not present

## 2024-03-08 DIAGNOSIS — E1129 Type 2 diabetes mellitus with other diabetic kidney complication: Secondary | ICD-10-CM | POA: Diagnosis not present

## 2024-03-08 DIAGNOSIS — D689 Coagulation defect, unspecified: Secondary | ICD-10-CM | POA: Diagnosis not present

## 2024-03-10 ENCOUNTER — Telehealth (INDEPENDENT_AMBULATORY_CARE_PROVIDER_SITE_OTHER): Payer: Self-pay

## 2024-03-10 NOTE — Telephone Encounter (Signed)
 Forward message to cardiologist

## 2024-03-10 NOTE — Telephone Encounter (Signed)
 Copied from CRM [id number]. Topic: Clinical - Medication Question >> Mar 10, 2024 11:40 AM Sophia H wrote: Reason for CRM: Patient was prescribed the following medications while admitted to the hospital and is needing to know if she will need to come in for an appointment with Rosaline Bohr to get refilled or if those refills can just be sent on her behalf. Please advise # [phone]   Atorvastatin  50mg  - 1 tablet twice daily  Midodrine  10MG  - 1 tablet twice daily  Metoprolol  Tartrte 25MG  - changed to 1 tablet twice daily    [address] [facility] [address] - , [place] - [address]

## 2024-03-11 DIAGNOSIS — D631 Anemia in chronic kidney disease: Secondary | ICD-10-CM | POA: Diagnosis not present

## 2024-03-11 DIAGNOSIS — D508 Other iron deficiency anemias: Secondary | ICD-10-CM | POA: Diagnosis not present

## 2024-03-11 DIAGNOSIS — N186 End stage renal disease: Secondary | ICD-10-CM | POA: Diagnosis not present

## 2024-03-11 DIAGNOSIS — E1129 Type 2 diabetes mellitus with other diabetic kidney complication: Secondary | ICD-10-CM | POA: Diagnosis not present

## 2024-03-11 DIAGNOSIS — D689 Coagulation defect, unspecified: Secondary | ICD-10-CM | POA: Diagnosis not present

## 2024-03-11 DIAGNOSIS — Z992 Dependence on renal dialysis: Secondary | ICD-10-CM | POA: Diagnosis not present

## 2024-03-11 DIAGNOSIS — N2581 Secondary hyperparathyroidism of renal origin: Secondary | ICD-10-CM | POA: Diagnosis not present

## 2024-03-13 DIAGNOSIS — N2581 Secondary hyperparathyroidism of renal origin: Secondary | ICD-10-CM | POA: Diagnosis not present

## 2024-03-13 DIAGNOSIS — N186 End stage renal disease: Secondary | ICD-10-CM | POA: Diagnosis not present

## 2024-03-13 DIAGNOSIS — D508 Other iron deficiency anemias: Secondary | ICD-10-CM | POA: Diagnosis not present

## 2024-03-13 DIAGNOSIS — D689 Coagulation defect, unspecified: Secondary | ICD-10-CM | POA: Diagnosis not present

## 2024-03-13 DIAGNOSIS — D631 Anemia in chronic kidney disease: Secondary | ICD-10-CM | POA: Diagnosis not present

## 2024-03-13 DIAGNOSIS — Z992 Dependence on renal dialysis: Secondary | ICD-10-CM | POA: Diagnosis not present

## 2024-03-13 DIAGNOSIS — E1129 Type 2 diabetes mellitus with other diabetic kidney complication: Secondary | ICD-10-CM | POA: Diagnosis not present

## 2024-03-15 DIAGNOSIS — D508 Other iron deficiency anemias: Secondary | ICD-10-CM | POA: Diagnosis not present

## 2024-03-15 DIAGNOSIS — D631 Anemia in chronic kidney disease: Secondary | ICD-10-CM | POA: Diagnosis not present

## 2024-03-15 DIAGNOSIS — Z992 Dependence on renal dialysis: Secondary | ICD-10-CM | POA: Diagnosis not present

## 2024-03-15 DIAGNOSIS — D689 Coagulation defect, unspecified: Secondary | ICD-10-CM | POA: Diagnosis not present

## 2024-03-15 DIAGNOSIS — N2581 Secondary hyperparathyroidism of renal origin: Secondary | ICD-10-CM | POA: Diagnosis not present

## 2024-03-15 DIAGNOSIS — N186 End stage renal disease: Secondary | ICD-10-CM | POA: Diagnosis not present

## 2024-03-15 DIAGNOSIS — E1129 Type 2 diabetes mellitus with other diabetic kidney complication: Secondary | ICD-10-CM | POA: Diagnosis not present

## 2024-03-18 DIAGNOSIS — E1129 Type 2 diabetes mellitus with other diabetic kidney complication: Secondary | ICD-10-CM | POA: Diagnosis not present

## 2024-03-18 DIAGNOSIS — D631 Anemia in chronic kidney disease: Secondary | ICD-10-CM | POA: Diagnosis not present

## 2024-03-18 DIAGNOSIS — D508 Other iron deficiency anemias: Secondary | ICD-10-CM | POA: Diagnosis not present

## 2024-03-18 DIAGNOSIS — N186 End stage renal disease: Secondary | ICD-10-CM | POA: Diagnosis not present

## 2024-03-18 DIAGNOSIS — Z992 Dependence on renal dialysis: Secondary | ICD-10-CM | POA: Diagnosis not present

## 2024-03-18 DIAGNOSIS — N2581 Secondary hyperparathyroidism of renal origin: Secondary | ICD-10-CM | POA: Diagnosis not present

## 2024-03-18 DIAGNOSIS — D689 Coagulation defect, unspecified: Secondary | ICD-10-CM | POA: Diagnosis not present

## 2024-03-18 NOTE — Telephone Encounter (Signed)
 Reached out to pt and lvm making her aware of provider response in regards to medication being refilled and if she has any questions or concerns to give a call

## 2024-03-20 DIAGNOSIS — D508 Other iron deficiency anemias: Secondary | ICD-10-CM | POA: Diagnosis not present

## 2024-03-20 DIAGNOSIS — N2581 Secondary hyperparathyroidism of renal origin: Secondary | ICD-10-CM | POA: Diagnosis not present

## 2024-03-20 DIAGNOSIS — E1129 Type 2 diabetes mellitus with other diabetic kidney complication: Secondary | ICD-10-CM | POA: Diagnosis not present

## 2024-03-20 DIAGNOSIS — N186 End stage renal disease: Secondary | ICD-10-CM | POA: Diagnosis not present

## 2024-03-20 DIAGNOSIS — Z992 Dependence on renal dialysis: Secondary | ICD-10-CM | POA: Diagnosis not present

## 2024-03-20 DIAGNOSIS — E1122 Type 2 diabetes mellitus with diabetic chronic kidney disease: Secondary | ICD-10-CM | POA: Diagnosis not present

## 2024-03-20 DIAGNOSIS — D689 Coagulation defect, unspecified: Secondary | ICD-10-CM | POA: Diagnosis not present

## 2024-03-20 DIAGNOSIS — D631 Anemia in chronic kidney disease: Secondary | ICD-10-CM | POA: Diagnosis not present

## 2024-03-22 ENCOUNTER — Inpatient Hospital Stay (HOSPITAL_COMMUNITY)
Admission: EM | Admit: 2024-03-22 | Discharge: 2024-03-29 | DRG: 228 | Disposition: A | Discharge status: Home / Self Care | Attending: Family Medicine | Admitting: Family Medicine

## 2024-03-22 ENCOUNTER — Encounter (HOSPITAL_COMMUNITY): Payer: Self-pay | Admitting: Emergency Medicine

## 2024-03-22 ENCOUNTER — Emergency Department (INDEPENDENT_AMBULATORY_CARE_PROVIDER_SITE_OTHER)

## 2024-03-22 ENCOUNTER — Other Ambulatory Visit: Payer: Self-pay

## 2024-03-22 ENCOUNTER — Emergency Department (HOSPITAL_COMMUNITY)

## 2024-03-22 DIAGNOSIS — I5032 Chronic diastolic (congestive) heart failure: Secondary | ICD-10-CM | POA: Diagnosis not present

## 2024-03-22 DIAGNOSIS — I4589 Other specified conduction disorders: Secondary | ICD-10-CM | POA: Diagnosis present

## 2024-03-22 DIAGNOSIS — S32049A Unspecified fracture of fourth lumbar vertebra, initial encounter for closed fracture: Secondary | ICD-10-CM | POA: Diagnosis present

## 2024-03-22 DIAGNOSIS — I4811 Longstanding persistent atrial fibrillation: Secondary | ICD-10-CM | POA: Diagnosis not present

## 2024-03-22 DIAGNOSIS — G8929 Other chronic pain: Secondary | ICD-10-CM | POA: Diagnosis present

## 2024-03-22 DIAGNOSIS — Z006 Encounter for examination for normal comparison and control in clinical research program: Secondary | ICD-10-CM | POA: Diagnosis not present

## 2024-03-22 DIAGNOSIS — E66813 Obesity, class 3: Secondary | ICD-10-CM | POA: Diagnosis present

## 2024-03-22 DIAGNOSIS — Z8616 Personal history of COVID-19: Secondary | ICD-10-CM

## 2024-03-22 DIAGNOSIS — E1129 Type 2 diabetes mellitus with other diabetic kidney complication: Secondary | ICD-10-CM | POA: Diagnosis not present

## 2024-03-22 DIAGNOSIS — J4489 Other specified chronic obstructive pulmonary disease: Secondary | ICD-10-CM | POA: Diagnosis present

## 2024-03-22 DIAGNOSIS — I959 Hypotension, unspecified: Secondary | ICD-10-CM | POA: Diagnosis present

## 2024-03-22 DIAGNOSIS — I2510 Atherosclerotic heart disease of native coronary artery without angina pectoris: Secondary | ICD-10-CM | POA: Diagnosis not present

## 2024-03-22 DIAGNOSIS — R404 Transient alteration of awareness: Secondary | ICD-10-CM | POA: Diagnosis not present

## 2024-03-22 DIAGNOSIS — F32A Depression, unspecified: Secondary | ICD-10-CM | POA: Diagnosis present

## 2024-03-22 DIAGNOSIS — Z9842 Cataract extraction status, left eye: Secondary | ICD-10-CM

## 2024-03-22 DIAGNOSIS — N186 End stage renal disease: Secondary | ICD-10-CM | POA: Diagnosis not present

## 2024-03-22 DIAGNOSIS — K219 Gastro-esophageal reflux disease without esophagitis: Secondary | ICD-10-CM | POA: Diagnosis not present

## 2024-03-22 DIAGNOSIS — W07XXXA Fall from chair, initial encounter: Secondary | ICD-10-CM | POA: Diagnosis present

## 2024-03-22 DIAGNOSIS — I82721 Chronic embolism and thrombosis of deep veins of right upper extremity: Secondary | ICD-10-CM | POA: Diagnosis present

## 2024-03-22 DIAGNOSIS — J449 Chronic obstructive pulmonary disease, unspecified: Secondary | ICD-10-CM | POA: Diagnosis present

## 2024-03-22 DIAGNOSIS — I4819 Other persistent atrial fibrillation: Secondary | ICD-10-CM | POA: Diagnosis not present

## 2024-03-22 DIAGNOSIS — Z992 Dependence on renal dialysis: Secondary | ICD-10-CM

## 2024-03-22 DIAGNOSIS — Z7901 Long term (current) use of anticoagulants: Secondary | ICD-10-CM

## 2024-03-22 DIAGNOSIS — Z79899 Other long term (current) drug therapy: Secondary | ICD-10-CM

## 2024-03-22 DIAGNOSIS — Z794 Long term (current) use of insulin: Secondary | ICD-10-CM

## 2024-03-22 DIAGNOSIS — D508 Other iron deficiency anemias: Secondary | ICD-10-CM | POA: Diagnosis not present

## 2024-03-22 DIAGNOSIS — E1169 Type 2 diabetes mellitus with other specified complication: Secondary | ICD-10-CM | POA: Diagnosis present

## 2024-03-22 DIAGNOSIS — I4891 Unspecified atrial fibrillation: Secondary | ICD-10-CM | POA: Diagnosis not present

## 2024-03-22 DIAGNOSIS — E785 Hyperlipidemia, unspecified: Secondary | ICD-10-CM | POA: Diagnosis present

## 2024-03-22 DIAGNOSIS — D631 Anemia in chronic kidney disease: Secondary | ICD-10-CM | POA: Diagnosis present

## 2024-03-22 DIAGNOSIS — R58 Hemorrhage, not elsewhere classified: Secondary | ICD-10-CM | POA: Diagnosis not present

## 2024-03-22 DIAGNOSIS — R0602 Shortness of breath: Secondary | ICD-10-CM

## 2024-03-22 DIAGNOSIS — Z9841 Cataract extraction status, right eye: Secondary | ICD-10-CM

## 2024-03-22 DIAGNOSIS — E1122 Type 2 diabetes mellitus with diabetic chronic kidney disease: Secondary | ICD-10-CM | POA: Diagnosis present

## 2024-03-22 DIAGNOSIS — I132 Hypertensive heart and chronic kidney disease with heart failure and with stage 5 chronic kidney disease, or end stage renal disease: Secondary | ICD-10-CM | POA: Diagnosis present

## 2024-03-22 DIAGNOSIS — F419 Anxiety disorder, unspecified: Secondary | ICD-10-CM | POA: Diagnosis present

## 2024-03-22 DIAGNOSIS — Z6841 Body Mass Index (BMI) 40.0 and over, adult: Secondary | ICD-10-CM

## 2024-03-22 DIAGNOSIS — Z882 Allergy status to sulfonamides status: Secondary | ICD-10-CM

## 2024-03-22 DIAGNOSIS — M51369 Other intervertebral disc degeneration, lumbar region without mention of lumbar back pain or lower extremity pain: Secondary | ICD-10-CM | POA: Diagnosis not present

## 2024-03-22 DIAGNOSIS — G4733 Obstructive sleep apnea (adult) (pediatric): Secondary | ICD-10-CM | POA: Diagnosis not present

## 2024-03-22 DIAGNOSIS — Z91030 Bee allergy status: Secondary | ICD-10-CM

## 2024-03-22 DIAGNOSIS — Z91041 Radiographic dye allergy status: Secondary | ICD-10-CM

## 2024-03-22 DIAGNOSIS — I499 Cardiac arrhythmia, unspecified: Secondary | ICD-10-CM | POA: Diagnosis not present

## 2024-03-22 DIAGNOSIS — Z86718 Personal history of other venous thrombosis and embolism: Secondary | ICD-10-CM

## 2024-03-22 DIAGNOSIS — I495 Sick sinus syndrome: Principal | ICD-10-CM | POA: Diagnosis present

## 2024-03-22 DIAGNOSIS — N2581 Secondary hyperparathyroidism of renal origin: Secondary | ICD-10-CM | POA: Diagnosis not present

## 2024-03-22 DIAGNOSIS — M545 Low back pain, unspecified: Secondary | ICD-10-CM | POA: Diagnosis present

## 2024-03-22 DIAGNOSIS — I517 Cardiomegaly: Secondary | ICD-10-CM | POA: Diagnosis not present

## 2024-03-22 DIAGNOSIS — Z9889 Other specified postprocedural states: Secondary | ICD-10-CM

## 2024-03-22 DIAGNOSIS — Z888 Allergy status to other drugs, medicaments and biological substances status: Secondary | ICD-10-CM

## 2024-03-22 DIAGNOSIS — R9431 Abnormal electrocardiogram [ECG] [EKG]: Secondary | ICD-10-CM | POA: Diagnosis present

## 2024-03-22 DIAGNOSIS — I4892 Unspecified atrial flutter: Principal | ICD-10-CM | POA: Diagnosis present

## 2024-03-22 DIAGNOSIS — Z743 Need for continuous supervision: Secondary | ICD-10-CM | POA: Diagnosis not present

## 2024-03-22 DIAGNOSIS — M47816 Spondylosis without myelopathy or radiculopathy, lumbar region: Secondary | ICD-10-CM | POA: Diagnosis not present

## 2024-03-22 DIAGNOSIS — Z8249 Family history of ischemic heart disease and other diseases of the circulatory system: Secondary | ICD-10-CM

## 2024-03-22 DIAGNOSIS — M1611 Unilateral primary osteoarthritis, right hip: Secondary | ICD-10-CM | POA: Diagnosis present

## 2024-03-22 LAB — CBC WITH DIFFERENTIAL/PLATELET
Abs Granulocyte: 4.6 K/uL (ref 1.5–6.5)
Abs Immature Granulocytes: 0.03 K/uL (ref 0.00–0.07)
Basophils Absolute: 0 K/uL (ref 0.0–0.1)
Basophils Relative: 0 %
Eosinophils Absolute: 0.2 K/uL (ref 0.0–0.5)
Eosinophils Relative: 2 %
HCT: 33.2 % — ABNORMAL LOW (ref 36.0–46.0)
Hemoglobin: 10.4 g/dL — ABNORMAL LOW (ref 12.0–15.0)
Immature Granulocytes: 0 %
Lymphocytes Relative: 15 %
Lymphs Abs: 1 K/uL (ref 0.7–4.0)
MCH: 31 pg (ref 26.0–34.0)
MCHC: 31.3 g/dL (ref 30.0–36.0)
MCV: 98.8 fL (ref 80.0–100.0)
Monocytes Absolute: 0.9 K/uL (ref 0.1–1.0)
Monocytes Relative: 13 %
Neutro Abs: 4.6 K/uL (ref 1.7–7.7)
Neutrophils Relative %: 70 %
Platelets: 116 K/uL — ABNORMAL LOW (ref 150–400)
RBC: 3.36 MIL/uL — ABNORMAL LOW (ref 3.87–5.11)
RDW: 15.7 % — ABNORMAL HIGH (ref 11.5–15.5)
WBC: 6.7 K/uL (ref 4.0–10.5)
nRBC: 0 % (ref 0.0–0.2)

## 2024-03-22 LAB — MAGNESIUM: Magnesium: 2 mg/dL (ref 1.7–2.4)

## 2024-03-22 LAB — HIV ANTIBODY (ROUTINE TESTING W REFLEX): HIV Screen 4th Generation wRfx: NONREACTIVE

## 2024-03-22 LAB — GLUCOSE, CAPILLARY
Glucose-Capillary: 184 mg/dL — ABNORMAL HIGH (ref 70–99)
Glucose-Capillary: 143 mg/dL — ABNORMAL HIGH (ref 70–99)

## 2024-03-22 LAB — HEPATIC FUNCTION PANEL
ALT: 13 U/L (ref 0–44)
AST: 25 U/L (ref 15–41)
Albumin: 3.7 g/dL (ref 3.5–5.0)
Alkaline Phosphatase: 87 U/L (ref 38–126)
Bilirubin, Direct: 0.2 mg/dL (ref 0.0–0.2)
Indirect Bilirubin: 0.7 mg/dL (ref 0.3–0.9)
Total Bilirubin: 0.9 mg/dL (ref 0.0–1.2)
Total Protein: 8.1 g/dL (ref 6.5–8.1)

## 2024-03-22 LAB — BASIC METABOLIC PANEL WITH GFR
Anion gap: 12 (ref 5–15)
BUN: 27 mg/dL — ABNORMAL HIGH (ref 8–23)
CO2: 30 mmol/L (ref 22–32)
Calcium: 9.1 mg/dL (ref 8.9–10.3)
Chloride: 94 mmol/L — ABNORMAL LOW (ref 98–111)
Creatinine, Ser: 7.45 mg/dL — ABNORMAL HIGH (ref 0.44–1.00)
GFR, Estimated: 6 mL/min — ABNORMAL LOW (ref >60)
Glucose, Bld: 185 mg/dL — ABNORMAL HIGH (ref 70–99)
Potassium: 3.8 mmol/L (ref 3.5–5.1)
Sodium: 136 mmol/L (ref 135–145)

## 2024-03-22 LAB — I-STAT CHEM 8, ED
BUN: 31 mg/dL — ABNORMAL HIGH (ref 8–23)
Calcium, Ion: 1.03 mmol/L — ABNORMAL LOW (ref 1.15–1.40)
Chloride: 96 mmol/L — ABNORMAL LOW (ref 98–111)
Creatinine, Ser: 7.8 mg/dL — ABNORMAL HIGH (ref 0.44–1.00)
Glucose, Bld: 190 mg/dL — ABNORMAL HIGH (ref 70–99)
HCT: 34 % — ABNORMAL LOW (ref 36.0–46.0)
Hemoglobin: 11.6 g/dL — ABNORMAL LOW (ref 12.0–15.0)
Potassium: 3.9 mmol/L (ref 3.5–5.1)
Sodium: 137 mmol/L (ref 135–145)
TCO2: 31 mmol/L (ref 22–32)

## 2024-03-22 LAB — CBG MONITORING, ED: Glucose-Capillary: 182 mg/dL — ABNORMAL HIGH (ref 70–99)

## 2024-03-22 LAB — TROPONIN I (HIGH SENSITIVITY)
Troponin I (High Sensitivity): 13 ng/L (ref <18)
Troponin I (High Sensitivity): 12 ng/L (ref <18)

## 2024-03-22 LAB — TSH: TSH: 2.506 u[IU]/mL (ref 0.350–4.500)

## 2024-03-22 LAB — LIPASE, BLOOD: Lipase: 33 U/L (ref 11–51)

## 2024-03-22 LAB — BRAIN NATRIURETIC PEPTIDE: B Natriuretic Peptide: 211.5 pg/mL — ABNORMAL HIGH (ref 0.0–100.0)

## 2024-03-22 MED ORDER — ETOMIDATE 2 MG/ML IV SOLN
0.1000 mg/kg | Freq: Once | INTRAVENOUS | Status: DC
  Filled 2024-03-22: qty 10

## 2024-03-22 MED ORDER — BUSPIRONE HCL 5 MG PO TABS
5.0000 mg | ORAL_TABLET | Freq: Two times a day (BID) | ORAL | Status: DC
  Administered 2024-03-22 – 2024-03-29 (×12): 5 mg via ORAL
  Filled 2024-03-22 (×14): qty 1

## 2024-03-22 MED ORDER — AMIODARONE HCL IN DEXTROSE 360-4.14 MG/200ML-% IV SOLN
30.0000 mg/h | INTRAVENOUS | Status: AC
  Administered 2024-03-22 – 2024-03-28 (×12): 30 mg/h via INTRAVENOUS
  Filled 2024-03-22 (×11): qty 200

## 2024-03-22 MED ORDER — HYDROMORPHONE HCL 1 MG/ML IJ SOLN
0.5000 mg | Freq: Once | INTRAMUSCULAR | Status: DC
  Filled 2024-03-22: qty 1

## 2024-03-22 MED ORDER — HYDROMORPHONE HCL 1 MG/ML IJ SOLN: 0.5000 mg | Freq: Once | INTRAMUSCULAR | Status: DC

## 2024-03-22 MED ORDER — NOREPINEPHRINE 4 MG/250ML-% IV SOLN
0.0000 ug/min | INTRAVENOUS | Status: DC
  Administered 2024-03-22: 2 ug/min via INTRAVENOUS
  Filled 2024-03-22: qty 250

## 2024-03-22 MED ORDER — ACETAMINOPHEN 325 MG PO TABS
650.0000 mg | ORAL_TABLET | Freq: Four times a day (QID) | ORAL | Status: DC | PRN
Start: 2024-03-22 — End: 2024-03-28
  Administered 2024-03-22: 650 mg via ORAL
  Filled 2024-03-22: qty 2

## 2024-03-22 MED ORDER — SODIUM CHLORIDE 0.9% FLUSH
3.0000 mL | Freq: Two times a day (BID) | INTRAVENOUS | Status: DC
  Administered 2024-03-22 – 2024-03-28 (×12): 3 mL via INTRAVENOUS

## 2024-03-22 MED ORDER — ROSUVASTATIN CALCIUM 5 MG PO TABS
10.0000 mg | ORAL_TABLET | Freq: Every day | ORAL | Status: DC
  Administered 2024-03-22 – 2024-03-28 (×7): 10 mg via ORAL
  Filled 2024-03-22 (×7): qty 2

## 2024-03-22 MED ORDER — FENTANYL CITRATE PF 50 MCG/ML IJ SOSY
50.0000 ug | PREFILLED_SYRINGE | Freq: Once | INTRAMUSCULAR | Status: AC
  Administered 2024-03-22: 50 ug via INTRAVENOUS
  Filled 2024-03-22: qty 1

## 2024-03-22 MED ORDER — APIXABAN 5 MG PO TABS
5.0000 mg | ORAL_TABLET | Freq: Two times a day (BID) | ORAL | Status: DC
  Administered 2024-03-22 – 2024-03-23 (×2): 5 mg via ORAL
  Filled 2024-03-22 (×2): qty 1

## 2024-03-22 MED ORDER — MIDODRINE HCL 5 MG PO TABS
10.0000 mg | ORAL_TABLET | Freq: Two times a day (BID) | ORAL | Status: DC
  Administered 2024-03-22 – 2024-03-29 (×15): 10 mg via ORAL
  Filled 2024-03-22 (×15): qty 2

## 2024-03-22 MED ORDER — ETOMIDATE 2 MG/ML IV SOLN
INTRAVENOUS | Status: AC | PRN
  Administered 2024-03-22: 10 mg via INTRAVENOUS

## 2024-03-22 MED ORDER — DULOXETINE HCL 60 MG PO CPEP
60.0000 mg | ORAL_CAPSULE | Freq: Every day | ORAL | Status: DC
  Administered 2024-03-23 – 2024-03-29 (×7): 60 mg via ORAL
  Filled 2024-03-22 (×7): qty 1

## 2024-03-22 MED ORDER — METOPROLOL TARTRATE 25 MG PO TABS
25.0000 mg | ORAL_TABLET | Freq: Two times a day (BID) | ORAL | Status: DC
  Administered 2024-03-22 – 2024-03-28 (×11): 25 mg via ORAL
  Filled 2024-03-22 (×12): qty 1

## 2024-03-22 MED ORDER — LACTATED RINGERS IV SOLN: INTRAVENOUS | Status: DC

## 2024-03-22 MED ORDER — POLYETHYLENE GLYCOL 3350 17 G PO PACK: 17.0000 g | PACK | Freq: Every day | ORAL | Status: DC | PRN

## 2024-03-22 MED ORDER — AMIODARONE HCL IN DEXTROSE 360-4.14 MG/200ML-% IV SOLN
60.0000 mg/h | INTRAVENOUS | Status: AC
  Administered 2024-03-22: 60 mg/h via INTRAVENOUS
  Filled 2024-03-22: qty 200

## 2024-03-22 MED ORDER — FENTANYL CITRATE PF 50 MCG/ML IJ SOSY: 50.0000 ug | PREFILLED_SYRINGE | INTRAMUSCULAR | Status: DC | PRN

## 2024-03-22 MED ORDER — OXYCODONE HCL 5 MG PO TABS
5.0000 mg | ORAL_TABLET | Freq: Once | ORAL | Status: AC | PRN
  Administered 2024-03-22: 5 mg via ORAL
  Filled 2024-03-22: qty 1

## 2024-03-22 MED ORDER — LIDOCAINE 5 % EX PTCH
1.0000 | MEDICATED_PATCH | CUTANEOUS | Status: DC
  Administered 2024-03-22 – 2024-03-23 (×2): 1 via TRANSDERMAL
  Filled 2024-03-22 (×2): qty 1

## 2024-03-22 MED ORDER — NALOXONE HCL 0.4 MG/ML IJ SOLN: 0.4000 mg | INTRAMUSCULAR | Status: DC | PRN

## 2024-03-22 MED ORDER — PANTOPRAZOLE SODIUM 40 MG PO TBEC
40.0000 mg | DELAYED_RELEASE_TABLET | Freq: Every day | ORAL | Status: DC
  Administered 2024-03-23 – 2024-03-29 (×7): 40 mg via ORAL
  Filled 2024-03-22 (×7): qty 1

## 2024-03-22 MED ORDER — ALBUTEROL SULFATE (2.5 MG/3ML) 0.083% IN NEBU: 3.0000 mL | INHALATION_SOLUTION | Freq: Four times a day (QID) | RESPIRATORY_TRACT | Status: DC | PRN

## 2024-03-22 MED ORDER — ACETAMINOPHEN 650 MG RE SUPP: 650.0000 mg | Freq: Four times a day (QID) | RECTAL | Status: DC | PRN

## 2024-03-22 MED ORDER — INSULIN ASPART 100 UNIT/ML IJ SOLN
0.0000 [IU] | Freq: Three times a day (TID) | INTRAMUSCULAR | Status: DC
  Administered 2024-03-22 – 2024-03-23 (×2): 1 [IU] via SUBCUTANEOUS
  Administered 2024-03-28: 3 [IU] via SUBCUTANEOUS
  Administered 2024-03-28 (×2): 1 [IU] via SUBCUTANEOUS

## 2024-03-22 MED ORDER — METOCLOPRAMIDE HCL 5 MG/ML IJ SOLN
5.0000 mg | Freq: Once | INTRAMUSCULAR | Status: AC
  Administered 2024-03-22: 5 mg via INTRAVENOUS
  Filled 2024-03-22: qty 2

## 2024-03-22 NOTE — ED Provider Notes (Signed)
 [place] EMERGENCY DEPARTMENT AT [facility] Provider Note   CSN: [id number] Arrival date & time: 03/22/24  9065     Patient presents with: Shortness of Breath   [name] is a 68 y.o. female.    Shortness of Breath Associated symptoms: chest pain   Patient presents for chest pain and shortness of breath.  Medical history includes HTN, HLD, sleep apnea, GERD, DM, DVT, COPD, CHF, atrial fibrillation, anxiety, depression.  She developed left-sided chest pain with radiation to her arm yesterday.  This has been persistent since that time.  She did have a fall and is unsure if the pain is related to the fall.  She has had low back pain since the fall.  She denies any other areas of discomfort.  She went to her dialysis session today.  She received 2 hours of dialysis.  She developed tachycardia and hypotension.  EMS was called.  EMS report SBP in the 90s.  Heart rate was elevated up to 150s.  It appeared to be a regular sinus rhythm.  Currently, patient endorses ongoing left-sided chest pain.  It is worsened with movement and inspiration.  EMS was unable to obtain SpO2.  She was placed on 2 L of oxygen  for comfort.     Prior to Admission medications   Medication Sig Start Date End Date Taking? Authorizing Provider  Accu-Chek Softclix Lancets lancets Use to check blood sugar 3 times daily. 01/01/24  Yes Newlin, Enobong, MD  acetaminophen  (TYLENOL ) 325 MG tablet Take 2 tablets (650 mg total) by mouth every 6 (six) hours as needed for mild pain or moderate pain. 12/07/21  Yes Arrien, Elidia Sieving, MD  albuterol  (PROVENTIL  HFA) 108 (90 Base) MCG/ACT inhaler Inhale 1-2 puffs into the lungs every 6 (six) hours as needed for wheezing or shortness of breath. 10/13/22  Yes Celestia Rosaline SQUIBB, NP  amiodarone  (PACERONE ) 200 MG tablet Take 1 tablet (200 mg total) by mouth daily. 11/23/23  Yes Bensimhon, Sieving SAUNDERS, MD  AURYXIA  1 GM 210 MG(Fe) tablet Take 210 mg by mouth 3 (three) times  daily.   Yes [provider]  Blood Glucose Monitoring Suppl (ACCU-CHEK GUIDE) w/Device KIT Use to check blood sugar 3 times daily. 01/01/24  Yes Newlin, Enobong, MD  busPIRone  (BUSPAR ) 5 MG tablet TAKE ONE TABLET BY MOUTH TWICE DAILY @9AM -5PM 02/14/24  Yes Celestia Rosaline SQUIBB, NP  diphenhydrAMINE  (BENADRYL ) 25 MG tablet Take 25 mg by mouth every 6 (six) hours as needed for itching. Patient taking differently: Take 25 mg by mouth as needed for itching. 03/31/22  Yes [provider]  doxercalciferol  (HECTOROL ) 4 MCG/2ML injection Inject 1.5 mLs (3 mcg total) into the vein Every Tuesday,Thursday,and Saturday with dialysis. 03/06/24  Yes Amin, Ankit C, MD  DULoxetine  (CYMBALTA ) 60 MG capsule TAKE ONE CAPSULE (60MG  TOTAL) BY MOUTH DAILY AT 9AM 02/29/24  Yes Edwards, Rosaline SQUIBB, NP  ELIQUIS  5 MG TABS tablet TAKE ONE TABLET (5MG  TOTAL) BY MOUTH TWICE DAILY @9AM -5PM 09/06/23  Yes Bensimhon, Sieving SAUNDERS, MD  EPINEPHrine  0.3 mg/0.3 mL IJ SOAJ injection Inject 0.3 mg into the muscle as needed for anaphylaxis. 06/25/20  Yes Celestia Rosaline SQUIBB, NP  fluticasone  (FLONASE  ALLERGY RELIEF) 50 MCG/ACT nasal spray Place 2 sprays into both nostrils daily as needed for allergies. Patient taking differently: Place 2 sprays into both nostrils as needed for allergies. 03/31/22  Yes [provider]  glucose blood (ACCU-CHEK GUIDE TEST) test strip Use to check blood sugar 3 times  daily. 01/01/24  Yes Newlin, Enobong, MD  insulin  glargine (LANTUS  SOLOSTAR) 100 UNIT/ML Solostar Pen Inject 10 Units into the skin daily. 09/27/23  Yes Celestia Rosaline SQUIBB, NP  insulin  lispro (HUMALOG  KWIKPEN) 100 UNIT/ML KwikPen For blood sugars 0-150 give 0 units of insulin , 151-200 give 2 units of insulin , 201-250 give 4 units, 251-300 give 6 units, 301-350 give 8 units, 351-400 give 10 units,> 400 give 12 units and call M.D. 09/27/23  Yes Newlin, Enobong, MD  lidocaine -prilocaine  (EMLA ) cream Apply 1 Application topically Every  Tuesday,Thursday,and Saturday with dialysis. 06/01/22  Yes [provider]  loratadine  (CLARITIN ) 10 MG tablet Take 1 tablet (10 mg total) by mouth every morning. 02/08/24  Yes Celestia Rosaline SQUIBB, NP  Methoxy PEG-Epoetin  Beta (MIRCERA IJ) 50 mcg. 01/29/24 01/27/25 Yes [provider]  metoCLOPramide  (REGLAN ) 5 MG tablet Take 1 tablet (5 mg total) by mouth every 8 (eight) hours as needed for nausea. 07/10/23  Yes Celestia Rosaline SQUIBB, NP  metoprolol  tartrate (LOPRESSOR ) 25 MG tablet Take 1 tablet (25 mg total) by mouth 2 (two) times daily. 01/25/24  Yes Vannie Reche RAMAN, NP  midodrine  (PROAMATINE ) 10 MG tablet Take 1 tablet (10 mg total) by mouth 2 (two) times daily. 03/05/24  Yes Amin, Ankit C, MD  Multiple Vitamins-Minerals (MULTIVITAMIN WOMEN PO) Take 1 tablet by mouth daily.   Yes [provider]  nitroGLYCERIN  (NITROSTAT ) 0.4 MG SL tablet Place 1 tablet (0.4 mg total) under the tongue every 5 (five) minutes x 3 doses as needed for chest pain. 11/08/23  Yes Lonni Slain, MD  pantoprazole  (PROTONIX ) 40 MG tablet Take 1 tablet (40 mg total) by mouth daily. 04/24/23  Yes Celestia Rosaline SQUIBB, NP  polyethylene glycol powder (MIRALAX ) 17 GM/SCOOP powder Take 17 g by mouth daily as needed (constipation.). 06/26/22  Yes [provider]  rosuvastatin  (CRESTOR ) 10 MG tablet Take 10 mg by mouth at bedtime. 02/28/24  Yes [provider]    Allergies: Bee pollen, Bee venom, Sulfa antibiotics, Chlorhexidine , and Iodinated contrast media    Review of Systems  Respiratory:  Positive for shortness of breath.   Cardiovascular:  Positive for chest pain.  Gastrointestinal:  Positive for nausea.  Musculoskeletal:  Positive for back pain.  All other systems reviewed and are negative.   Updated Vital Signs BP 96/71 (BP Location: Right Arm)   Pulse (!) 140   Temp 97.8 F (36.6 C) (Oral)   Resp (!) 21   Ht 5' 7 (1.702 m)   Wt 131.5 kg   SpO2 99%   BMI 45.42  kg/m   Physical Exam Vitals and nursing note reviewed.  Constitutional:      General: She is not in acute distress.    Appearance: She is well-developed. She is not ill-appearing, toxic-appearing or diaphoretic.  HENT:     Head: Normocephalic and atraumatic.     Mouth/Throat:     Mouth: Mucous membranes are moist.  Eyes:     Conjunctiva/sclera: Conjunctivae normal.  Cardiovascular:     Rate and Rhythm: Normal rate and regular rhythm.     Heart sounds: No murmur heard. Pulmonary:     Effort: Pulmonary effort is normal. Tachypnea present. No respiratory distress.     Breath sounds: Rales present. No decreased breath sounds, wheezing or rhonchi.  Chest:     Chest wall: No tenderness.  Abdominal:     Palpations: Abdomen is soft.     Tenderness: There is no abdominal tenderness.  Musculoskeletal:  General: No swelling.     Cervical back: Normal range of motion and neck supple.     Right lower leg: Edema present.     Left lower leg: Edema present.  Skin:    General: Skin is warm and dry.     Coloration: Skin is not cyanotic or pale.  Neurological:     General: No focal deficit present.     Mental Status: She is alert and oriented to person, place, and time.  Psychiatric:        Mood and Affect: Mood normal.        Behavior: Behavior normal.     (all labs ordered are listed, but only abnormal results are displayed) Labs Reviewed  BASIC METABOLIC PANEL WITH GFR - Abnormal; Notable for the following components:      Result Value   Chloride 94 (*)    Glucose, Bld 185 (*)    BUN 27 (*)    Creatinine, Ser 7.45 (*)    GFR, Estimated 6 (*)    All other components within normal limits  BRAIN NATRIURETIC PEPTIDE - Abnormal; Notable for the following components:   B Natriuretic Peptide 211.5 (*)    All other components within normal limits  CBC WITH DIFFERENTIAL/PLATELET - Abnormal; Notable for the following components:   RBC 3.36 (*)    Hemoglobin 10.4 (*)    HCT 33.2  (*)    RDW 15.7 (*)    Platelets 116 (*)    All other components within normal limits  CBG MONITORING, ED - Abnormal; Notable for the following components:   Glucose-Capillary 182 (*)    All other components within normal limits  I-STAT CHEM 8, ED - Abnormal; Notable for the following components:   Chloride 96 (*)    BUN 31 (*)    Creatinine, Ser 7.80 (*)    Glucose, Bld 190 (*)    Calcium , Ion 1.03 (*)    Hemoglobin 11.6 (*)    HCT 34.0 (*)    All other components within normal limits  MAGNESIUM   LIPASE, BLOOD  HEPATIC FUNCTION PANEL  TSH  TROPONIN I (HIGH SENSITIVITY)  TROPONIN I (HIGH SENSITIVITY)    EKG: None  Radiology: DG Lumbar Spine 2-3 Views Result Date: 03/22/2024 EXAM: 2 or 3 VIEW(S) XRAY OF THE LUMBAR SPINE 03/22/2024 11:57:00 AM COMPARISON: CT lumbar spine 06/18/2023 CLINICAL HISTORY: Fall. Reason for exam: fall SOB. FINDINGS: LUMBAR SPINE: BONES: No signs of lumbar spine fracture or subluxation. Compression deformity involving the T11 vertebra. DISCS AND DEGENERATIVE CHANGES: Mild lumbar degenerative disc disease and facet arthropathy. SOFT TISSUES: Cholecystectomy clips noted in the left upper quadrant of the abdomen. 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 IMPRESSION: 1. No acute lumbar spine fracture or subluxation. 2. Compression deformity involving the T11 vertebra, similar to prior exam. 3. Mild lumbar degenerative disc disease and facet arthropathy. Electronically signed by: Waddell Calk MD 03/22/2024 12:05 PM EDT RP Workstation: [id number]   DG Chest Portable 1 View Result Date: 03/22/2024 CLINICAL DATA:  Shortness of breath. EXAM: PORTABLE CHEST 1 VIEW COMPARISON:  03/03/2024 FINDINGS: Lungs are adequately inflated without focal airspace consolidation or effusion. No pneumothorax. Mild stable cardiomegaly. Remainder of the exam is unchanged. IMPRESSION: 1. No acute cardiopulmonary disease. 2. Mild stable cardiomegaly.  Electronically Signed   By: Toribio Agreste M.D.   On: 03/22/2024 10:06     .Sedation  Date/Time: 03/22/2024 3:11 PM  Performed by: Melvenia Motto, MD Authorized  by: Melvenia Motto, MD   Consent:    Consent obtained:  Verbal and written   Consent given by:  Patient   Risks discussed:  Allergic reaction, prolonged hypoxia resulting in organ damage, inadequate sedation, respiratory compromise necessitating ventilatory assistance and intubation, nausea and vomiting Universal protocol:    Immediately prior to procedure, a time out was called: yes     Patient identity confirmed:  Verbally with patient Indications:    Procedure performed:  Cardioversion   Procedure necessitating sedation performed by:  Physician performing sedation Pre-sedation assessment:    Time since last food or drink:  7 hours   ASA classification: class 4 - patient with severe systemic disease that is a constant threat to life     Mouth opening:  2 finger widths   Thyromental distance:  2 finger widths   Mallampati score:  IV - only hard palate visible   Neck mobility: normal     Pre-sedation assessments completed and reviewed: airway patency, cardiovascular function, hydration status, mental status, nausea/vomiting, pain level and respiratory function   A pre-sedation assessment was completed prior to the start of the procedure Immediate pre-procedure details:    Reassessment: Patient reassessed immediately prior to procedure     Reviewed: vital signs, relevant labs/tests and NPO status     Verified: bag valve mask available, emergency equipment available, intubation equipment available, IV patency confirmed, oxygen  available and suction available   Procedure details (see MAR for exact dosages):    Preoxygenation:  Nasal cannula   Sedation:  Etomidate    Intended level of sedation: moderate (conscious sedation)   Analgesia:  Fentanyl    Intra-procedure monitoring:  Blood pressure monitoring, cardiac monitor, continuous  capnometry, continuous pulse oximetry, frequent vital sign checks and frequent LOC assessments   Intra-procedure events: airway compromise     Intra-procedure management:  Airway repositioning   Total Provider sedation time (minutes):  15 Post-procedure details:   A post-sedation assessment was completed following the completion of the procedure.   Attendance: Constant attendance by certified staff until patient recovered     Recovery: Patient returned to pre-procedure baseline     Post-sedation assessments completed and reviewed: airway patency, cardiovascular function, hydration status, mental status, nausea/vomiting, pain level and respiratory function     Patient is stable for discharge or admission: yes     Procedure completion:  Tolerated well, no immediate complications .Cardioversion  Date/Time: 03/22/2024 3:13 PM  Performed by: Melvenia Motto, MD Authorized by: Melvenia Motto, MD   Consent:    Consent obtained:  Verbal and written   Consent given by:  Patient   Risks discussed:  Death, induced arrhythmia and pain   Alternatives discussed:  No treatment and delayed treatment Pre-procedure details:    Cardioversion basis:  Elective   Rhythm:  Atrial flutter   Electrode placement:  Anterior-posterior Patient sedated: Yes. Refer to sedation procedure documentation for details of sedation.  Attempt one:    Cardioversion mode:  Synchronous   Shock (Joules):  200   Shock outcome:  Conversion to normal sinus rhythm Post-procedure details:    Patient status:  Awake   Patient tolerance of procedure:  Tolerated well, no immediate complications    Medications Ordered in the ED  amiodarone  (NEXTERONE  PREMIX) 360-4.14 MG/200ML-% (1.8 mg/mL) IV infusion (60 mg/hr Intravenous New Bag/Given 03/22/24 1206)  amiodarone  (NEXTERONE  PREMIX) 360-4.14 MG/200ML-% (1.8 mg/mL) IV infusion (has no administration in time range)  fentaNYL  (SUBLIMAZE ) injection 50 mcg (has no administration in time range)  etomidate  (AMIDATE ) injection 13.16 mg (0 mg Intravenous Hold 03/22/24 1510)  apixaban  (ELIQUIS ) tablet 5 mg (has no administration in time range)  HYDROmorphone  (DILAUDID ) injection 0.5 mg (has no administration in time range)  fentaNYL  (SUBLIMAZE ) injection 50 mcg (50 mcg Intravenous Given 03/22/24 1001)  fentaNYL  (SUBLIMAZE ) injection 50 mcg (50 mcg Intravenous Given 03/22/24 1243)  metoCLOPramide  (REGLAN ) injection 5 mg (5 mg Intravenous Given 03/22/24 1502)  etomidate  (AMIDATE ) injection (10 mg Intravenous Given 03/22/24 1503)                                    Medical Decision Making Amount and/or Complexity of Data Reviewed Labs: ordered. Radiology: ordered.  Risk Prescription drug management. Decision regarding hospitalization.   This patient presents to the ED for concern of chest pain and shortness of breath, this involves an extensive number of treatment options, and is a complaint that carries with it a high risk of complications and morbidity.  The differential diagnosis includes arrhythmia, CHF, ACS, pericarditis, pulmonary edema, pneumonia   Co morbidities / Chronic conditions that complicate the patient evaluation  HTN, HLD, sleep apnea, GERD, DM, DVT, COPD, CHF, atrial fibrillation, anxiety, depression   Additional history obtained:  Additional history obtained from EMR External records from outside source obtained and reviewed including EMS   Lab Tests:  I Ordered, and personally interpreted labs.  The pertinent results include: Baseline anemia, no leukocytosis, baseline creatinine consistent with ESRD, normal troponin, mildly elevated BNP   Imaging Studies ordered:  I ordered imaging studies including chest x-ray, x-ray of lumbar spine I independently visualized and interpreted imaging which showed no acute findings.  Redemonstration of known T11 compression deformity I agree with the radiologist interpretation   Cardiac Monitoring: / EKG:  The patient was  maintained on a cardiac monitor.  I personally viewed and interpreted the cardiac monitored which showed an underlying rhythm of: Atrial flutter with rapid ventricular rate, subsequently sinus rhythm following cardioversion   Problem List / ED Course / Critical interventions / Medication management  Patient presents for chest pain and shortness of breath.  Chest pain has been ongoing since yesterday.  She did have a recent fall and does have some acute on chronic low back pain.  On arrival in the ED, she is alert and oriented.  Heart rate is elevated in the range of 150.  EKG shows regular rhythm consistent with atrial flutter with 2 1 conduction.  Current blood pressures are on the soft side.  Fentanyl  was ordered for analgesia.  Workup was initiated.  Per chart review, patient has a complex history with atrial fibrillation.  She has undergone 17 cardioversions in the past.  She is currently on amiodarone .  Amiodarone  GTT was ordered.  Cardiology was consulted.  Patient was evaluated EP cardiology.  They recommend attempted cardioversion in the ED and patient staying on amiodarone  drip tonight.  They will reassess tomorrow for further plan.  Patient was consented for cardioversion.  She was placed on low dose Levophed  for blood pressure support. She was sedated with 10 mg of etomidate .  She underwent successful cardioversion.  Repeat EKG shows sinus rhythm.  Following cardioversion, blood pressure improved.  She was weaned off of Levophed .  Patient to be admitted for further management. I ordered medication including amiodarone  for rhythm control, fentanyl  and Dilaudid  for analgesia, etomidate  for procedural sedation Reevaluation of the patient after these medicines showed that the  patient improved I have reviewed the patients home medicines and have made adjustments as needed   Consultations Obtained:  I requested consultation with the cardiology,  and discussed lab and imaging findings as well as  pertinent plan - they recommend: Cardioversion in the ED, keep on amnio gtt. tonight, cardiology will follow in consult.   Social Determinants of Health:  Has PCP  CRITICAL CARE Performed by: Bernardino Fireman   Total critical care time: 32 minutes  Critical care time was exclusive of separately billable procedures and treating other patients.  Critical care was necessary to treat or prevent imminent or life-threatening deterioration.  Critical care was time spent personally by me on the following activities: development of treatment plan with patient and/or surrogate as well as nursing, discussions with consultants, evaluation of patient's response to treatment, examination of patient, obtaining history from patient or surrogate, ordering and performing treatments and interventions, ordering and review of laboratory studies, ordering and review of radiographic studies, pulse oximetry and re-evaluation of patient's condition.     Final diagnoses:  Atrial flutter with rapid ventricular response [facility])    ED Discharge Orders     None          Fireman Bernardino, MD 03/22/24 1531

## 2024-03-22 NOTE — Progress Notes (Addendum)
 Admitted from Emergency Department to 6E01 Placed on cardiac monitor and vital signs obtained.      03/22/24 1723 03/22/24 1726  Vitals  Temp 97.8 F (36.6 C)  --   Temp Source Oral  --   BP  --  (!) 92/50  MAP (mmHg)  --  (!) 60  BP Location  --  Right Arm  BP Method  --  Automatic  Patient Position (if appropriate)  --  Lying  Pulse Rate  --  83  Pulse Rate Source  --  Monitor  ECG Heart Rate  --  83  Resp  --  18  Level of Consciousness  Level of Consciousness  --  Alert  MEWS COLOR  MEWS Score Color Green Green  Oxygen  Therapy  SpO2  --  94 %  O2 Device  --  Room Air  ECG Monitoring  Telemetry Box Number  --  [id number]  Tele Box Verification Completed by Second Verifier  --  Completed (Miranda RN/ [place]  NT)

## 2024-03-22 NOTE — Sedation Documentation (Signed)
 200J shock administered by Dr Melvenia

## 2024-03-22 NOTE — ED Triage Notes (Signed)
 Per [facility] pt coming from dialysis center after two hours of treatment she became hypotensive. State patients HR was ranging anywhere from 80-180. Patient was becoming short of breath and nauseated. Patient c/o left sided chest pain radiating into left arm yesterday. Reports full dialysis treatment Thursday. Left arm restricted.

## 2024-03-22 NOTE — Progress Notes (Signed)
 RT note. RT present during cardioversion.

## 2024-03-22 NOTE — Progress Notes (Signed)
 Admission screening completed with patient at bedside.  [name] states that she was supposed to be seen by [facility] after previous hospitalization but they have not been out to the home.     03/22/24 1730  Discharge Planning  Type of Residence Private residence  Living Arrangements Children  Home Care Services Yes  Type of Home Care Services Home RN;Home PT;Home OT  Home Care Agency (if known) [facility] - per notes from CM/SW on previous admission  Support Systems Children  Does the patient have any problems obtaining your medications? No  Does the patient have difficulty doing errands alone such as visiting a doctor's office or shopping? N  Family/patient expects to be discharged to: Private residence  Once discharged, how will the patient get to their discharge location? Family/Friend - Photographer  Once discharged, how will the patient get to their follow-up appointment? Family/Friend - Partnered Transport     03/22/24 1730   Patient Belongings  Patient/Family advised about valuables policy? Yes  Home Medications No meds brought to hospital  Patient Belongings Kept at bedside  Belongings at Bedside Electronic device(s);Clothing  Bedside: Electronic Device(s) Cellphone

## 2024-03-22 NOTE — H&P (Addendum)
 History and Physical   [name] FMW:[id number] DOB: [birth year] DOA: 03/22/2024  PCP: Celestia Rosaline SQUIBB, NP   Patient coming from: Home/dialysis  Chief Complaint: Tachycardia, shortness of breath  HPI: [name] is a 68 y.o. female with medical history significant of hypertension, hypertension, hyperlipidemia, diabetes, GERD, CAD, atrial fibrillation, chronic diastolic CHF, anxiety, depression, ESRD on HD, OSA on CPAP, obesity, COPD, QT prolongation, status post mitral valve and tricuspid valve repair, DVT presented with shortness of breath and tachycardia.  Patient states she had some symptoms yesterday consisting of left-sided chest pain radiating to her arm that has been persistent.  Did have a fall and felt like it could be related to that.  During dialysis today patient began to develop tachycardia and low blood pressure about 2 hours then.  EMS was called and patient was found to have blood pressure in the 90s and heart rate in the 150s with unclear rhythm.  Patient was brought to the ED found to have atrial fibrillation/flutter with RVR.  Patient was also admitted 7/14-7/16 with chest pain, shortness of breath.  At that time cardioversion for A-fib was aborted due to evidence of junctional rhythm.  Patient had some hypotension but midodrine  was increased with improvement.  Patient was also dialyzed while admitted.  She denies fevers, chills abdominal pain, constipation, diarrhea, nausea, vomiting.  ED Course: Vital signs in the ED notable for heart rate in the 110s-150s, blood pressure in the 90s-120 systolic, respirate in the 20s.  Initially was placed on 2 L supplemental oxygen . Lab workup included BMP with chloride 94, BUN 27, creatinine 7.45, glucose 185, LFTs within normal limits.  CBC with hemoglobin stable at 10.4.  BNP indeterminate at 211.  Lipase normal.  Troponin negative with repeat pending.  TSH pending.  Magnesium  normal.  Chest x-ray showed no  acute normality.  L-spine x-ray showed no acute abnormality, stable T11 compression deformity, degenerative disc disease.  Cardiology/electrophysiology were consulted in the ED and recommended cardioversion and continued amiodarone  infusion overnight and will reevaluate tomorrow.  EDP was able to successfully cardiovert patient in the ED.  Patient remains on amiodarone  and Eliquis .  Review of Systems: As per HPI otherwise all other systems reviewed and are negative.  Past Medical History:  Diagnosis Date   Acute on chronic systolic CHF (congestive heart failure) (HCC) 12/21/2020   Allergic rhinitis    Anemia    Arthritis    Asthma    Atrial fibrillation with RVR (HCC) 11/09/2023   Chronic diastolic CHF (congestive heart failure) (HCC)    Chronic kidney disease    Dialysis Tu/TH/Sa   COPD (chronic obstructive pulmonary disease) (HCC)    COVID-19 03/28/2023   Depression    DM (diabetes mellitus) (HCC)    DVT (deep vein thrombosis) in pregnancy    Gastroenteritis 03/27/2023   GERD (gastroesophageal reflux disease)    HCAP (healthcare-associated pneumonia) 06/21/2022   HTN (hypertension)    Hyperlipidemia    Obesity    Pneumonia 04/2018   RIGHT LOBE   Sleep apnea    needs to be retested - to get new cpap    Past Surgical History:  Procedure Laterality Date   ABDOMINAL HYSTERECTOMY     AV FISTULA PLACEMENT Left 03/17/2022   Procedure: LEFT ARM ARTERIOVENOUS (AV) FISTULA CREATION;  Surgeon: Eliza Lonni RAMAN, MD;  Location: [facility] OR;  Service: Vascular;  Laterality: Left;   BIOPSY  01/12/2023   Procedure: BIOPSY;  Surgeon: Rollin Dover, MD;  Location: [facility];  Service: Gastroenterology;;   BUBBLE STUDY  11/26/2020   Procedure: BUBBLE STUDY;  Surgeon: Lonni Slain, MD;  Location: [facility] ENDOSCOPY;  Service: Cardiovascular;;   CARDIOVERSION N/A 05/06/2020   Procedure: CARDIOVERSION;  Surgeon: Lonni Slain, MD;  Location: [facility] ENDOSCOPY;  Service: Cardiovascular;   Laterality: N/A;   CARDIOVERSION N/A 11/04/2020   Procedure: CARDIOVERSION;  Surgeon: Pietro Redell RAMAN, MD;  Location: [facility] ENDOSCOPY;  Service: Cardiovascular;  Laterality: N/A;   CARDIOVERSION N/A 02/01/2021   Procedure: CARDIOVERSION;  Surgeon: Cherrie Toribio SAUNDERS, MD;  Location: [facility] ENDOSCOPY;  Service: Cardiovascular;  Laterality: N/A;   CARDIOVERSION N/A 02/09/2021   Procedure: CARDIOVERSION;  Surgeon: Cherrie Toribio SAUNDERS, MD;  Location: [facility] ENDOSCOPY;  Service: Cardiovascular;  Laterality: N/A;   CARDIOVERSION N/A 04/19/2021   Procedure: CARDIOVERSION;  Surgeon: Okey Vina GAILS, MD;  Location: [facility] ENDOSCOPY;  Service: Cardiovascular;  Laterality: N/A;   CARDIOVERSION N/A 11/25/2021   Procedure: CARDIOVERSION;  Surgeon: Cherrie Toribio SAUNDERS, MD;  Location: [facility] ENDOSCOPY;  Service: Cardiovascular;  Laterality: N/A;   CARDIOVERSION N/A 01/31/2022   Procedure: CARDIOVERSION;  Surgeon: Cherrie Toribio SAUNDERS, MD;  Location: [facility] ENDOSCOPY;  Service: Cardiovascular;  Laterality: N/A;   CARDIOVERSION N/A 07/23/2023   Procedure: CARDIOVERSION (CATH LAB);  Surgeon: Francyne Headland, MD;  Location: [facility] INVASIVE CV LAB;  Service: Cardiovascular;  Laterality: N/A;   CARDIOVERSION N/A 12/05/2023   Procedure: CARDIOVERSION;  Surgeon: Cherrie Toribio SAUNDERS, MD;  Location: [facility] INVASIVE CV LAB;  Service: Cardiovascular;  Laterality: N/A;   CATARACT EXTRACTION, BILATERAL     CHOLECYSTECTOMY     COLONOSCOPY WITH PROPOFOL  N/A 03/05/2015   Procedure: COLONOSCOPY WITH PROPOFOL ;  Surgeon: Belvie Just, MD;  Location: [facility] ENDOSCOPY;  Service: Endoscopy;  Laterality: N/A;   DIAGNOSTIC LAPAROSCOPY     ESOPHAGOGASTRODUODENOSCOPY (EGD) WITH PROPOFOL  N/A 01/12/2023   Procedure: ESOPHAGOGASTRODUODENOSCOPY (EGD) WITH PROPOFOL ;  Surgeon: Just Belvie, MD;  Location: [facility] ENDOSCOPY;  Service: Gastroenterology;  Laterality: N/A;   FISTULA SUPERFICIALIZATION Left 03/17/2022   Procedure: FISTULA SUPERFICIALIZATION -LEFT;  Surgeon: Eliza Lonni RAMAN, MD;  Location: [facility] OR;  Service: Vascular;  Laterality: Left;   ingrown hallux Left    IR FLUORO GUIDE CV LINE RIGHT  03/14/2022   IR US  GUIDE VASC ACCESS RIGHT  03/14/2022   KNEE SURGERY     LEFT HEART CATH AND CORONARY ANGIOGRAPHY N/A 12/31/2017   Procedure: LEFT HEART CATH AND CORONARY ANGIOGRAPHY;  Surgeon: Levern Hutching, MD;  Location: [facility] INVASIVE CV LAB;  Service: Cardiovascular;  Laterality: N/A;   MAZE N/A 12/29/2020   Procedure: MAZE;  Surgeon: Kerrin Elspeth BROCKS, MD;  Location: [facility] OR;  Service: Open Heart Surgery;  Laterality: N/A;   MITRAL VALVE REPAIR N/A 12/29/2020   Procedure: MITRAL VALVE REPAIR USING CARBOMEDICS ANNULOFLEX RING SIZE 30;  Surgeon: Kerrin Elspeth BROCKS, MD;  Location: [facility] OR;  Service: Open Heart Surgery;  Laterality: N/A;   REVISON OF ARTERIOVENOUS FISTULA Left 01/05/2023   Procedure: REVISION OF LEFT ARTERIOVENOUS FISTULA WITH INTERPOSITION PTFE GRAFT;  Surgeon: Eliza Lonni RAMAN, MD;  Location: [facility] OR;  Service: Vascular;  Laterality: Left;  ELIQUIS  AND HOLD 48 X HOURS   RIGHT HEART CATH N/A 12/22/2020   Procedure: RIGHT HEART CATH;  Surgeon: Rolan Ezra RAMAN, MD;  Location: [facility] INVASIVE CV LAB;  Service: Cardiovascular;  Laterality: N/A;   RIGHT HEART CATH N/A 01/31/2021   Procedure: RIGHT HEART CATH;  Surgeon: Cherrie Toribio SAUNDERS, MD;  Location: [facility] INVASIVE CV LAB;  Service: Cardiovascular;  Laterality: N/A;  RIGHT HEART CATH N/A 07/29/2021   Procedure: RIGHT HEART CATH;  Surgeon: Cherrie Toribio SAUNDERS, MD;  Location: [facility] INVASIVE CV LAB;  Service: Cardiovascular;  Laterality: N/A;   RIGHT HEART CATH N/A 11/24/2021   Procedure: RIGHT HEART CATH;  Surgeon: Cherrie Toribio SAUNDERS, MD;  Location: [facility] INVASIVE CV LAB;  Service: Cardiovascular;  Laterality: N/A;   RIGHT HEART CATH N/A 02/14/2022   Procedure: RIGHT HEART CATH;  Surgeon: Cherrie Toribio SAUNDERS, MD;  Location: [facility] INVASIVE CV LAB;  Service: Cardiovascular;  Laterality: N/A;   RIGHT HEART CATH N/A 02/22/2022    Procedure: RIGHT HEART CATH;  Surgeon: Rolan Ezra RAMAN, MD;  Location: [facility] INVASIVE CV LAB;  Service: Cardiovascular;  Laterality: N/A;   TEE WITHOUT CARDIOVERSION N/A 11/26/2020   Procedure: TRANSESOPHAGEAL ECHOCARDIOGRAM (TEE);  Surgeon: Lonni Slain, MD;  Location: [facility] ENDOSCOPY;  Service: Cardiovascular;  Laterality: N/A;   TEE WITHOUT CARDIOVERSION N/A 12/29/2020   Procedure: TRANSESOPHAGEAL ECHOCARDIOGRAM (TEE);  Surgeon: Kerrin Elspeth BROCKS, MD;  Location: [facility] OR;  Service: Open Heart Surgery;  Laterality: N/A;   TEE WITHOUT CARDIOVERSION N/A 01/20/2022   Procedure: TRANSESOPHAGEAL ECHOCARDIOGRAM (TEE);  Surgeon: Cherrie Toribio SAUNDERS, MD;  Location: [facility] ENDOSCOPY;  Service: Cardiovascular;  Laterality: N/A;   TRICUSPID VALVE REPLACEMENT N/A 12/29/2020   Procedure: 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 RING SIZE 34;  Surgeon: Kerrin Elspeth BROCKS, MD;  Location: [facility] OR;  Service: Open Heart Surgery;  Laterality: N/A;    Social History  reports that she has never smoked. She has never been exposed to tobacco smoke. She has never used smokeless tobacco. She reports that she does not drink alcohol  and does not use drugs.  Allergies  Allergen Reactions   Bee Pollen Anaphylaxis and Other (See Comments)    UNCONFIRMED, but IS allergic to bee VENOM   Bee Venom Anaphylaxis   Sulfa Antibiotics Itching and Rash   Chlorhexidine      Unknown rxn.   Iodinated Contrast Media Nausea And Vomiting    Family History  Problem Relation Age of Onset   Breast cancer Mother    Cancer Mother    Hypertension Mother    Cancer Father    Hypertension Sister    Hypertension Brother    CAD Other   Reviewed on admission  Prior to Admission medications   Medication Sig Start Date End Date Taking? Authorizing Provider  Accu-Chek Softclix Lancets lancets Use to check blood sugar 3 times daily. 01/01/24  Yes Newlin, Enobong, MD  acetaminophen  (TYLENOL ) 325 MG tablet Take 2 tablets (650 mg  total) by mouth every 6 (six) hours as needed for mild pain or moderate pain. 12/07/21  Yes Arrien, Elidia Toribio, MD  albuterol  (PROVENTIL  HFA) 108 [phone] Base) MCG/ACT inhaler Inhale 1-2 puffs into the lungs every 6 (six) hours as needed for wheezing or shortness of breath. 10/13/22  Yes Celestia Rosaline SQUIBB, NP  amiodarone  (PACERONE ) 200 MG tablet Take 1 tablet (200 mg total) by mouth daily. 11/23/23  Yes Bensimhon, Toribio SAUNDERS, MD  AURYXIA  1 GM 210 MG(Fe) tablet Take 210 mg by mouth 3 (three) times daily.   Yes [provider]  Blood Glucose Monitoring Suppl (ACCU-CHEK GUIDE) w/Device KIT Use to check blood sugar 3 times daily. 01/01/24  Yes Newlin, Corrina, MD  busPIRone  (BUSPAR ) 5 MG tablet TAKE ONE TABLET BY MOUTH TWICE DAILY @9AM -5PM 02/14/24  Yes Celestia Rosaline SQUIBB, NP  diphenhydrAMINE  (BENADRYL ) 25 MG tablet Take 25 mg by mouth every 6 (six) hours as needed  for itching. Patient taking differently: Take 25 mg by mouth as needed for itching. 03/31/22  Yes [provider]  doxercalciferol  (HECTOROL ) 4 MCG/2ML injection Inject 1.5 mLs (3 mcg total) into the vein Every Tuesday,Thursday,and Saturday with dialysis. 03/06/24  Yes Amin, Ankit C, MD  DULoxetine  (CYMBALTA ) 60 MG capsule TAKE ONE CAPSULE (60MG  TOTAL) BY MOUTH DAILY AT 9AM 02/29/24  Yes Edwards, Rosaline SQUIBB, NP  ELIQUIS  5 MG TABS tablet TAKE ONE TABLET (5MG  TOTAL) BY MOUTH TWICE DAILY @9AM -5PM 09/06/23  Yes Bensimhon, Toribio SAUNDERS, MD  EPINEPHrine  0.3 mg/0.3 mL IJ SOAJ injection Inject 0.3 mg into the muscle as needed for anaphylaxis. 06/25/20  Yes Celestia Rosaline SQUIBB, NP  fluticasone  (FLONASE  ALLERGY RELIEF) 50 MCG/ACT nasal spray Place 2 sprays into both nostrils daily as needed for allergies. Patient taking differently: Place 2 sprays into both nostrils as needed for allergies. 03/31/22  Yes [provider]  glucose blood (ACCU-CHEK GUIDE TEST) test strip Use to check blood sugar 3 times daily. 01/01/24  Yes Newlin, Enobong, MD   insulin  glargine (LANTUS  SOLOSTAR) 100 UNIT/ML Solostar Pen Inject 10 Units into the skin daily. 09/27/23  Yes Celestia Rosaline SQUIBB, NP  insulin  lispro (HUMALOG  KWIKPEN) 100 UNIT/ML KwikPen For blood sugars 0-150 give 0 units of insulin , 151-200 give 2 units of insulin , 201-250 give 4 units, 251-300 give 6 units, 301-350 give 8 units, 351-400 give 10 units,> 400 give 12 units and call M.D. 09/27/23  Yes Newlin, Enobong, MD  lidocaine -prilocaine  (EMLA ) cream Apply 1 Application topically Every Tuesday,Thursday,and Saturday with dialysis. 06/01/22  Yes [provider]  loratadine  (CLARITIN ) 10 MG tablet Take 1 tablet (10 mg total) by mouth every morning. 02/08/24  Yes Celestia Rosaline SQUIBB, NP  Methoxy PEG-Epoetin  Beta (MIRCERA IJ) 50 mcg. 01/29/24 01/27/25 Yes [provider]  metoCLOPramide  (REGLAN ) 5 MG tablet Take 1 tablet (5 mg total) by mouth every 8 (eight) hours as needed for nausea. 07/10/23  Yes Celestia Rosaline SQUIBB, NP  metoprolol  tartrate (LOPRESSOR ) 25 MG tablet Take 1 tablet (25 mg total) by mouth 2 (two) times daily. 01/25/24  Yes Vannie Reche RAMAN, NP  midodrine  (PROAMATINE ) 10 MG tablet Take 1 tablet (10 mg total) by mouth 2 (two) times daily. 03/05/24  Yes Amin, Ankit C, MD  Multiple Vitamins-Minerals (MULTIVITAMIN WOMEN PO) Take 1 tablet by mouth daily.   Yes [provider]  nitroGLYCERIN  (NITROSTAT ) 0.4 MG SL tablet Place 1 tablet (0.4 mg total) under the tongue every 5 (five) minutes x 3 doses as needed for chest pain. 11/08/23  Yes Lonni Slain, MD  pantoprazole  (PROTONIX ) 40 MG tablet Take 1 tablet (40 mg total) by mouth daily. 04/24/23  Yes Celestia Rosaline SQUIBB, NP  polyethylene glycol powder (MIRALAX ) 17 GM/SCOOP powder Take 17 g by mouth daily as needed (constipation.). 06/26/22  Yes [provider]  rosuvastatin  (CRESTOR ) 10 MG tablet Take 10 mg by mouth at bedtime. 02/28/24  Yes [provider]    Physical Exam: Vitals:   03/22/24 1325  03/22/24 1345 03/22/24 1400 03/22/24 1456  BP:   93/71 96/71  Pulse: (!) 147   (!) 140  Resp: 20 20 (!) 21 (!) 21  Temp:    97.8 F (36.6 C)  TempSrc:    Oral  SpO2: 98% 100%  99%  Weight:      Height:        Physical Exam Constitutional:      General: She is not in acute distress.  Appearance: Normal appearance. She is obese.  HENT:     Head: Normocephalic and atraumatic.     Mouth/Throat:     Mouth: Mucous membranes are moist.     Pharynx: Oropharynx is clear.  Eyes:     Extraocular Movements: Extraocular movements intact.     Pupils: Pupils are equal, round, and reactive to light.  Cardiovascular:     Rate and Rhythm: Normal rate and regular rhythm.     Pulses: Normal pulses.     Heart sounds: Normal heart sounds.  Pulmonary:     Effort: Pulmonary effort is normal. No respiratory distress.     Breath sounds: Normal breath sounds.  Abdominal:     General: Bowel sounds are normal. There is no distension.     Palpations: Abdomen is soft.     Tenderness: There is no abdominal tenderness.  Musculoskeletal:        General: No swelling or deformity.     Right lower leg: Edema present.     Left lower leg: Edema present.  Skin:    General: Skin is warm and dry.  Neurological:     General: No focal deficit present.     Mental Status: Mental status is at baseline.    Labs on Admission: I have personally reviewed following labs and imaging studies  CBC: Recent Labs  Lab 03/22/24 0954 03/22/24 1002  WBC 6.7  --   NEUTROABS 4.6  --   HGB 10.4* 11.6*  HCT 33.2* 34.0*  MCV 98.8  --   PLT 116*  --     Basic Metabolic Panel: Recent Labs  Lab 03/22/24 0954 03/22/24 1002  NA 136 137  K 3.8 3.9  CL 94* 96*  CO2 30  --   GLUCOSE 185* 190*  BUN 27* 31*  CREATININE 7.45* 7.80*  CALCIUM  9.1  --   MG 2.0  --     GFR: Estimated Creatinine Clearance: 9.9 mL/min (A) (by C-G formula based on SCr of 7.8 mg/dL (H)).  Liver Function Tests: Recent Labs  Lab  03/22/24 0954  AST 25  ALT 13  ALKPHOS 87  BILITOT 0.9  PROT 8.1  ALBUMIN  3.7    Urine analysis:    Component Value Date/Time   COLORURINE AMBER (A) 11/07/2022 1447   APPEARANCEUR CLOUDY (A) 11/07/2022 1447   LABSPEC 1.015 11/07/2022 1447   PHURINE 5.0 11/07/2022 1447   GLUCOSEU NEGATIVE 11/07/2022 1447   HGBUR SMALL (A) 11/07/2022 1447   BILIRUBINUR small (A) 02/21/2023 1748   KETONESUR trace (5) (A) 02/21/2023 1748   KETONESUR NEGATIVE 11/07/2022 1447   PROTEINUR =100 (A) 02/21/2023 1748   PROTEINUR 100 (A) 11/07/2022 1447   UROBILINOGEN 0.2 02/21/2023 1748   UROBILINOGEN 1.0 10/26/2014 0249   NITRITE Negative 02/21/2023 1748   NITRITE NEGATIVE 11/07/2022 1447   LEUKOCYTESUR Negative 02/21/2023 1748   LEUKOCYTESUR TRACE (A) 11/07/2022 1447    Radiological Exams on Admission: DG Lumbar Spine 2-3 Views Result Date: 03/22/2024 EXAM: 2 or 3 VIEW(S) XRAY OF THE LUMBAR SPINE 03/22/2024 11:57:00 AM COMPARISON: CT lumbar spine 06/18/2023 CLINICAL HISTORY: Fall. Reason for exam: fall SOB. FINDINGS: LUMBAR SPINE: BONES: No signs of lumbar spine fracture or subluxation. Compression deformity involving the T11 vertebra. DISCS AND DEGENERATIVE CHANGES: Mild lumbar degenerative disc disease and facet arthropathy. SOFT TISSUES: Cholecystectomy clips noted in the left upper quadrant of the abdomen. 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 IMPRESSION: 1. No acute lumbar spine  fracture or subluxation. 2. Compression deformity involving the T11 vertebra, similar to prior exam. 3. Mild lumbar degenerative disc disease and facet arthropathy. Electronically signed by: Waddell Calk MD 03/22/2024 12:05 PM EDT RP Workstation: [id number]   DG Chest Portable 1 View Result Date: 03/22/2024 CLINICAL DATA:  Shortness of breath. EXAM: PORTABLE CHEST 1 VIEW COMPARISON:  03/03/2024 FINDINGS: Lungs are adequately inflated without focal airspace consolidation or effusion.  No pneumothorax. Mild stable cardiomegaly. Remainder of the exam is unchanged. IMPRESSION: 1. No acute cardiopulmonary disease. 2. Mild stable cardiomegaly. Electronically Signed   By: Toribio Agreste M.D.   On: 03/22/2024 10:06   EKG: Independently reviewed.  Initial EKG showed sinus tachycardia versus other at 150 bpm.  QTc prolonged at 527, nonspecific T wave changes.    Repeat EKG presumably after cardioversion showed sinus rhythm at 95 bpm.  Nonspecific intraventricular conduction delay with QRS of 105.  Nonspecific T wave changes.  Assessment/Plan Active Problems:   ESRD (end stage renal disease) (HCC)   GERD (gastroesophageal reflux disease)   OSA on CPAP   COPD (chronic obstructive pulmonary disease) (HCC)   Class 3 obesity   Type 2 diabetes mellitus with hyperlipidemia (HCC)   S/P mitral valve repair   S/P tricuspid valve repair   Chronic diastolic CHF (congestive heart failure) (HCC)   QT prolongation   Anxiety and depression   Chronic deep vein thrombosis (DVT) of other vein of right upper extremity (HCC)   HLD (hyperlipidemia)   CAD in native artery   Persistent atrial fibrillation (HCC)   Atrial fibrillation/flutter with RVR > Long history of A-fib/flutter with multiple cardioversions. 18th cardioversion today. Failed Tikosyn  in the past.  Now on amiodarone . > Presenting with RVR and symptoms.  Able to be cardioverted in the ED. > EP consulted and recommended the above cardioversion and to continue with IV amiodarone  overnight as well as Eliquis  for now. - Appreciate EP recommendations and assistance - Monitor on progressive unit of [place] - Continue with amiodarone  infusion - Continue Eliquis  - Continue metoprolol  - Supportive care  ESRD on HD > Last dialysis session was today, able to complete 2 hours before symptom onset of tachycardia and low blood pressure. > Renal function and electrolytes currently stable.  No urgent indication for dialysis.  Will hold off on  nephrology consult unless it appears patient will remain admitted through Monday 8/4. - Trend renal function and electrolytes - Continue midodrine   Hypertension Hypotension > Has had more issues with hypotension and hypertension lately. - Does remain on metoprolol  - Continue midodrine   Hyperlipidemia - Continue rosuvastatin   Diabetes - SSI  GERD - Continue PPI  Chronic diastolic CHF Status post tricuspid valve repair and mitral valve repair > Last echo in February 2025 with EF 55-60%, indeterminate diastolic function, mildly reduced RV function.  Noted to be status post TV repair and MV repair. -Volume managed with dialysis Continue home metoprolol   Anxiety Depression - Continue BuSpar , duloxetine   OSA - Continue home CPAP  Obesity - Noted  COPD - Continue home PR albuterol   QTc prolongation > Initial EKG with QTc of 527.  On repeat EKG QTc improved to 477.  History of DVT - On Eliquis  as above   DVT prophylaxis: Eliquis  Code Status:   Full Family Communication:  Updated at bedside  Disposition Plan:   Patient is from:  Home  Anticipated DC to:  Home  Anticipated DC date:  1 to 3 days  Anticipated DC barriers: None  Consults called:  Electrophysiology Admission status:  Observation, progressive  Severity of Illness: The appropriate patient status for this patient is OBSERVATION. Observation status is judged to be reasonable and necessary in order to provide the required intensity of service to ensure the patient's safety. The patient's presenting symptoms, physical exam findings, and initial radiographic and laboratory data in the context of their medical condition is felt to place them at decreased risk for further clinical deterioration. Furthermore, it is anticipated that the patient will be medically stable for discharge from the hospital within 2 midnights of admission.    Marsa KATHEE Scurry MD [facility]  How to contact the [facility] Attending or  Consulting provider 7A - 7P or covering provider during after hours 7P -7A, for this patient?   Check the care team in [facility] and look for a) attending/consulting [facility] provider listed and b) the [facility] team listed Log into [email or web address] and use [facility]'s universal password to access. If you do not have the password, please contact the hospital operator. Locate the [facility] provider you are looking for under [facility] and page to a number that you can be directly reached. If you still have difficulty reaching the provider, please page the [facility] (Director on Call) for the Hospitalists listed on amion for assistance.  03/22/2024, 3:31 PM

## 2024-03-22 NOTE — Plan of Care (Signed)
   Problem: Education: Goal: Ability to describe self-care measures that may prevent or decrease complications (Diabetes Survival Skills Education) will improve Outcome: Progressing Goal: Individualized Educational Video(s) Outcome: Progressing

## 2024-03-22 NOTE — Consult Note (Signed)
 Cardiology Consultation   Patient ID: [name] MRN: [id number]; DOB: 1956-02-23  Admit date: 03/22/2024 Date of Consult: 03/22/2024  PCP:  Celestia Rosaline SQUIBB, NP   [facility] Providers Cardiologist:  Shelda Bruckner, MD  Electrophysiologist:  OLE ONEIDA HOLTS, MD       History of Present Illness: [name] is a 68 y.o. female with , type 2 DM, morbid obesity, ESRD on HD, severe MR s/p Mitral and Tricuspid Valvular ring annuloplasty, Left and Right sided MAZE procedure, and clipping of her Left Atrial Appendage in 2022 who presented today with tachycardia that occurred during dialysis.  She completed about 2 hours of dialysis and then developed have tachycardia and hypotension, requiring dialysis to be stopped.  She was taken by EMS for further evaluation.  She knows that she is in atrial flutter.  She reports feeling generally unwell intermittently with left-sided chest discomfort while out of rhythm.  She is quite frustrated by recurrence of her atrial arrhythmias.  Hungry and would like to eat.   Past Medical History:  Diagnosis Date   Acute on chronic systolic CHF (congestive heart failure) (HCC) 12/21/2020   Allergic rhinitis    Anemia    Arthritis    Asthma    Chronic diastolic CHF (congestive heart failure) (HCC)    Chronic kidney disease    Dialysis Tu/TH/Sa   COPD (chronic obstructive pulmonary disease) (HCC)    COVID-19 03/28/2023   Depression    DM (diabetes mellitus) (HCC)    DVT (deep vein thrombosis) in pregnancy    Gastroenteritis 03/27/2023   GERD (gastroesophageal reflux disease)    HTN (hypertension)    Hyperlipidemia    Obesity    Pneumonia 04/2018   RIGHT LOBE   Sleep apnea    needs to be retested - to get new cpap    Past Surgical History:  Procedure Laterality Date   ABDOMINAL HYSTERECTOMY     AV FISTULA PLACEMENT Left 03/17/2022   Procedure: LEFT ARM ARTERIOVENOUS (AV) FISTULA CREATION;  Surgeon:  Eliza Bruckner RAMAN, MD;  Location: [facility] OR;  Service: Vascular;  Laterality: Left;   BIOPSY  01/12/2023   Procedure: BIOPSY;  Surgeon: Rollin Dover, MD;  Location: [facility];  Service: Gastroenterology;;   VASSIE STUDY  11/26/2020   Procedure: BUBBLE STUDY;  Surgeon: Bruckner Shelda, MD;  Location: [facility] ENDOSCOPY;  Service: Cardiovascular;;   CARDIOVERSION N/A 05/06/2020   Procedure: CARDIOVERSION;  Surgeon: Bruckner Shelda, MD;  Location: [facility] ENDOSCOPY;  Service: Cardiovascular;  Laterality: N/A;   CARDIOVERSION N/A 11/04/2020   Procedure: CARDIOVERSION;  Surgeon: Pietro Redell RAMAN, MD;  Location: [facility] ENDOSCOPY;  Service: Cardiovascular;  Laterality: N/A;   CARDIOVERSION N/A 02/01/2021   Procedure: CARDIOVERSION;  Surgeon: Cherrie Toribio SAUNDERS, MD;  Location: [facility] ENDOSCOPY;  Service: Cardiovascular;  Laterality: N/A;   CARDIOVERSION N/A 02/09/2021   Procedure: CARDIOVERSION;  Surgeon: Cherrie Toribio SAUNDERS, MD;  Location: [facility] ENDOSCOPY;  Service: Cardiovascular;  Laterality: N/A;   CARDIOVERSION N/A 04/19/2021   Procedure: CARDIOVERSION;  Surgeon: Okey Vina GAILS, MD;  Location: [facility] ENDOSCOPY;  Service: Cardiovascular;  Laterality: N/A;   CARDIOVERSION N/A 11/25/2021   Procedure: CARDIOVERSION;  Surgeon: Cherrie Toribio SAUNDERS, MD;  Location: [facility] ENDOSCOPY;  Service: Cardiovascular;  Laterality: N/A;   CARDIOVERSION N/A 01/31/2022   Procedure: CARDIOVERSION;  Surgeon: Cherrie Toribio SAUNDERS, MD;  Location: [facility] ENDOSCOPY;  Service: Cardiovascular;  Laterality: N/A;   CARDIOVERSION N/A 07/23/2023   Procedure: CARDIOVERSION (CATH LAB);  Surgeon: Francyne Headland, MD;  Location: [facility] INVASIVE CV LAB;  Service: Cardiovascular;  Laterality: N/A;   CARDIOVERSION N/A 12/05/2023   Procedure: CARDIOVERSION;  Surgeon: Cherrie Toribio SAUNDERS, MD;  Location: [facility] INVASIVE CV LAB;  Service: Cardiovascular;  Laterality: N/A;   CATARACT EXTRACTION, BILATERAL     CHOLECYSTECTOMY     COLONOSCOPY WITH PROPOFOL  N/A 03/05/2015    Procedure: COLONOSCOPY WITH PROPOFOL ;  Surgeon: Belvie Just, MD;  Location: [facility] ENDOSCOPY;  Service: Endoscopy;  Laterality: N/A;   DIAGNOSTIC LAPAROSCOPY     ESOPHAGOGASTRODUODENOSCOPY (EGD) WITH PROPOFOL  N/A 01/12/2023   Procedure: ESOPHAGOGASTRODUODENOSCOPY (EGD) WITH PROPOFOL ;  Surgeon: Just Belvie, MD;  Location: [facility] ENDOSCOPY;  Service: Gastroenterology;  Laterality: N/A;   FISTULA SUPERFICIALIZATION Left 03/17/2022   Procedure: FISTULA SUPERFICIALIZATION -LEFT;  Surgeon: Eliza Lonni RAMAN, MD;  Location: [facility] OR;  Service: Vascular;  Laterality: Left;   ingrown hallux Left    IR FLUORO GUIDE CV LINE RIGHT  03/14/2022   IR US  GUIDE VASC ACCESS RIGHT  03/14/2022   KNEE SURGERY     LEFT HEART CATH AND CORONARY ANGIOGRAPHY N/A 12/31/2017   Procedure: LEFT HEART CATH AND CORONARY ANGIOGRAPHY;  Surgeon: Levern Hutching, MD;  Location: [facility] INVASIVE CV LAB;  Service: Cardiovascular;  Laterality: N/A;   MAZE N/A 12/29/2020   Procedure: MAZE;  Surgeon: Kerrin Elspeth BROCKS, MD;  Location: [facility] OR;  Service: Open Heart Surgery;  Laterality: N/A;   MITRAL VALVE REPAIR N/A 12/29/2020   Procedure: MITRAL VALVE REPAIR USING CARBOMEDICS ANNULOFLEX RING SIZE 30;  Surgeon: Kerrin Elspeth BROCKS, MD;  Location: [facility] OR;  Service: Open Heart Surgery;  Laterality: N/A;   REVISON OF ARTERIOVENOUS FISTULA Left 01/05/2023   Procedure: REVISION OF LEFT ARTERIOVENOUS FISTULA WITH INTERPOSITION PTFE GRAFT;  Surgeon: Eliza Lonni RAMAN, MD;  Location: [facility] OR;  Service: Vascular;  Laterality: Left;  ELIQUIS  AND HOLD 48 X HOURS   RIGHT HEART CATH N/A 12/22/2020   Procedure: RIGHT HEART CATH;  Surgeon: Rolan Ezra RAMAN, MD;  Location: [facility] INVASIVE CV LAB;  Service: Cardiovascular;  Laterality: N/A;   RIGHT HEART CATH N/A 01/31/2021   Procedure: RIGHT HEART CATH;  Surgeon: Cherrie Toribio SAUNDERS, MD;  Location: [facility]VASIVE CV LAB;  Service: Cardiovascular;  Laterality: N/A;   RIGHT HEART CATH N/A 07/29/2021   Procedure: RIGHT HEART  CATH;  Surgeon: Cherrie Toribio SAUNDERS, MD;  Location: [facility]VAS[facility] CV LAB;  Service: Cardiovascular;  Laterality: N/A;   RIGHT HEART CATH N/A 11/24/2021   Procedure: RIGHT HEART CATH;  Surgeon: Cherrie Toribio SAUNDERS, MD;  Location: [facility]VAS[facility] CV LAB;  Service: Cardiovascular;  Laterality: N/A;   RIGHT HEART CATH N/A 02/14/2022   Procedure: RIGHT HEART CATH;  Surgeon: Cherrie Toribio SAUNDERS, MD;  Location: [facility]VAS[facility] CV LAB;  Service: Cardiovascular;  Laterality: N/A;   RIGHT HEART CATH N/A 02/22/2022   Procedure: RIGHT HEART CATH;  Surgeon: Rolan Ezra RAMAN, MD;  Location: [facility] INVASIVE CV LAB;  Service: Cardiovascular;  Laterality: N/A;   TEE WITHOUT CARDIOVERSION N/A 11/26/2020   Procedure: TRANSESOPHAGEAL ECHOCARDIOGRAM (TEE);  Surgeon: Lonni Slain, MD;  Location: [facility] ENDOSCOPY;  Service: Cardiovascular;  Laterality: N/A;   TEE WITHOUT CARDIOVERSION N/A 12/29/2020   Procedure: TRANSESOPHAGEAL ECHOCARDIOGRAM (TEE);  Surgeon: Kerrin Elspeth BROCKS, MD;  Location: [facility] OR;  Service: Open Heart Surgery;  Laterality: N/A;   TEE WITHOUT CARDIOVERSION N/A 01/20/2022   Procedure: TRANSESOPHAGEAL ECHOCARDIOGRAM (TEE);  Surgeon: Cherrie Toribio SAUNDERS, MD;  Location: [facility] ENDOSCOPY;  Service: Cardiovascular;  Laterality: N/A;   TRICUSPID VALVE REPLACEMENT N/A 12/29/2020  Procedure: 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 RING SIZE 34;  Surgeon: Kerrin Elspeth BROCKS, MD;  Location: [facility] OR;  Service: Open Heart Surgery;  Laterality: N/A;    Scheduled Meds:  etomidate   0.1 mg/kg Intravenous Once   Continuous Infusions:  amiodarone  60 mg/hr (03/22/24 1206)   amiodarone      lactated ringers      norepinephrine  (LEVOPHED ) Adult infusion 2 mcg/min (03/22/24 1452)   PRN Meds: etomidate , fentaNYL  (SUBLIMAZE ) injection  Allergies:    Allergies  Allergen Reactions   Bee Pollen Anaphylaxis and Other (See Comments)    UNCONFIRMED, but IS allergic to bee VENOM   Bee Venom Anaphylaxis   Sulfa Antibiotics  Itching and Rash   Chlorhexidine      Unknown rxn.   Iodinated Contrast Media Nausea And Vomiting    Social History:   Social History   Socioeconomic History   Marital status: Widowed    Spouse name: Not on file   Number of children: 2   Years of education: Not on file   Highest education level: Some college, no degree  Occupational History   Not on file  Tobacco Use   Smoking status: Never    Passive exposure: Never   Smokeless tobacco: Never   Tobacco comments:    Never smoke 10/20/22  Vaping Use   Vaping status: Never Used  Substance and Sexual Activity   Alcohol  use: No   Drug use: No   Sexual activity: Not Currently  Other Topics Concern   Not on file  Social History Narrative   Pt lives in [place] with spouse.  2 grown children.   Previously worked in [employer] at [employer].  Now on disability   Social Drivers of Health   Financial Resource Strain: Low Risk  (11/19/2023)   Overall Financial Resource Strain (CARDIA)    Difficulty of Paying Living Expenses: Not hard at all  Food Insecurity: No Food Insecurity (03/06/2024)   Hunger Vital Sign    Worried About Running Out of Food in the Last Year: Never true    Ran Out of Food in the Last Year: Never true  Transportation Needs: No Transportation Needs (03/06/2024)   PRAPARE - Administrator, Civil Service (Medical): No    Lack of Transportation (Non-Medical): No  Physical Activity: Insufficiently Active (06/04/2023)   Exercise Vital Sign    Days of Exercise per Week: 2 days    Minutes of Exercise per Session: 20 min  Stress: No Stress Concern Present (06/04/2023)   [employer] of Occupational Health - Occupational Stress Questionnaire    Feeling of Stress : Only a little  Social Connections: Moderately Isolated (03/04/2024)   Social Connection and Isolation Panel    Frequency of Communication with Friends and Family: More than three times a week    Frequency of Social Gatherings with Friends and  Family: More than three times a week    Attends Religious Services: More than 4 times per year    Active Member of [employer] or Organizations: No    Attends Banker Meetings: Never    Marital Status: Widowed  Intimate Partner Violence: Not At Risk (03/06/2024)   Humiliation, Afraid, Rape, and Kick questionnaire    Fear of Current or Ex-Partner: No    Emotionally Abused: No    Physically Abused: No    Sexually Abused: No    Family History:   Family History  Problem Relation Age of Onset   Breast cancer Mother  Cancer Mother    Hypertension Mother    Cancer Father    Hypertension Sister    Hypertension Brother    CAD Other      ROS:  Please see the history of present illness.   All other ROS reviewed and negative.     Physical Exam/Data: Vitals:   03/22/24 1325 03/22/24 1345 03/22/24 1400 03/22/24 1456  BP:   93/71 96/71  Pulse: (!) 147   (!) 140  Resp: 20 20 (!) 21 (!) 21  Temp:    97.8 F (36.6 C)  TempSrc:    Oral  SpO2: 98% 100%  99%  Weight:      Height:       No intake or output data in the 24 hours ending 03/22/24 1504    03/22/2024    9:43 AM 03/04/2024    8:50 PM 03/04/2024    8:44 PM  Last 3 Weights  Weight (lbs) 290 lb 292 lb 8.8 oz 292 lb 8.8 oz  Weight (kg) 131.543 kg 132.7 kg 132.7 kg     Body mass index is 45.42 kg/m.   General: Well developed, in no acute distress.  Neck: No JVD.  Cardiac: Tachycardic, regular Resp: Normal work of breathing.  Ext: No edema.  Neuro: No gross focal deficits.  Psych: Normal affect.   EKG:  The EKG was personally reviewed and demonstrates:  2:1 AFL Telemetry:  Telemetry was personally reviewed and demonstrates:  AFL   Relevant CV Studies: Coronary CTA 12/26/23:  IMPRESSION: 1. Coronary calcium  score of 58.9. This was 43 percentile for age and sex matched control. 2. Normal coronary origin with right dominance  3. CAD-RADS 2. Mild non-obstructive CAD (25-49%). Consider non-atherosclerotic causes of  chest pain. Consider preventive therapy and risk factor modification. 4. Total plaque volume 56 mm3 which is 26th percentile for age- and sex-matched controls (calcified plaque 15 mm3; non-calcified plaque 41 mm3,Low attenuation 1 mm3). 5. The pulmonary artery is mildly dilated (3.25 cm). 6. Status post annuloplasty of the mitral and tricuspid valve, annuloplasty ring well seated. 7. Status post left atrial appendage closure device. 8. Right atrium and right ventricle are both enlarged.  Echo 10/15/23:  1. 3D EF 69%. Left ventricular ejection fraction, by estimation, is 55 to  60%. The left ventricle has normal function. The left ventricle has no  regional wall motion abnormalities. Left ventricular diastolic parameters  are indeterminate.   2. Right ventricular systolic function is mildly reduced. The right  ventricular size is mildly enlarged.   3. Left atrial size was severely dilated.   4. Right atrial size was severely dilated.   5. Post repair with ring Mild residual MR Mean diastolic gradient 8 mmHg  at HR 68 bpm. The mitral valve has been repaired/replaced. No evidence of  mitral valve regurgitation. No evidence of mitral stenosis.   6. Post repair with ring. The tricuspid valve is has been  repaired/replaced. Tricuspid valve regurgitation is moderate.   7. The aortic valve is tricuspid. Aortic valve regurgitation is not  visualized. No aortic stenosis is present.   8. The inferior vena cava is normal in size with greater than 50%  respiratory variability, suggesting right atrial pressure of 3 mmHg.   Laboratory Data: High Sensitivity Troponin:   Recent Labs  Lab 03/03/24 1651 03/03/24 1729 03/03/24 1914 03/22/24 0954 03/22/24 1144  TROPONINIHS 8 7 8 13 12      Chemistry Recent Labs  Lab 03/22/24 0954 03/22/24 1002  NA 136  137  K 3.8 3.9  CL 94* 96*  CO2 30  --   GLUCOSE 185* 190*  BUN 27* 31*  CREATININE 7.45* 7.80*  CALCIUM  9.1  --   MG 2.0  --    GFRNONAA 6*  --   ANIONGAP 12  --     Recent Labs  Lab 03/22/24 0954  PROT 8.1  ALBUMIN  3.7  AST 25  ALT 13  ALKPHOS 87  BILITOT 0.9   Lipids No results for input(s): CHOL, TRIG, HDL, LABVLDL, LDLCALC, CHOLHDL in the last 168 hours.  Hematology Recent Labs  Lab 03/22/24 0954 03/22/24 1002  WBC 6.7  --   RBC 3.36*  --   HGB 10.4* 11.6*  HCT 33.2* 34.0*  MCV 98.8  --   MCH 31.0  --   MCHC 31.3  --   RDW 15.7*  --   PLT 116*  --    Thyroid  No results for input(s): TSH, FREET4 in the last 168 hours.  BNP Recent Labs  Lab 03/22/24 0954  BNP 211.5*    DDimer No results for input(s): DDIMER in the last 168 hours.  Radiology/Studies:  DG Lumbar Spine 2-3 Views Result Date: 03/22/2024 EXAM: 2 or 3 VIEW(S) XRAY OF THE LUMBAR SPINE 03/22/2024 11:57:00 AM COMPARISON: CT lumbar spine 06/18/2023 CLINICAL HISTORY: Fall. Reason for exam: fall SOB. FINDINGS: LUMBAR SPINE: BONES: No signs of lumbar spine fracture or subluxation. Compression deformity involving the T11 vertebra. DISCS AND DEGENERATIVE CHANGES: Mild lumbar degenerative disc disease and facet arthropathy. SOFT TISSUES: Cholecystectomy clips noted in the left upper quadrant of the abdomen. 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 IMPRESSION: 1. No acute lumbar spine fracture or subluxation. 2. Compression deformity involving the T11 vertebra, similar to prior exam. 3. Mild lumbar degenerative disc disease and facet arthropathy. Electronically signed by: Waddell Calk MD 03/22/2024 12:05 PM EDT RP Workstation: [id number]   DG Chest Portable 1 View Result Date: 03/22/2024 CLINICAL DATA:  Shortness of breath. EXAM: PORTABLE CHEST 1 VIEW COMPARISON:  03/03/2024 FINDINGS: Lungs are adequately inflated without focal airspace consolidation or effusion. No pneumothorax. Mild stable cardiomegaly. Remainder of the exam is unchanged. IMPRESSION: 1. No acute cardiopulmonary disease.  2. Mild stable cardiomegaly. Electronically Signed   By: Toribio Agreste M.D.   On: 03/22/2024 10:06     Assessment and Plan:  #. Persistent atrial fibrillation/flutter: Symptomatic. Recurrent with fast rates despite AAD therapy - previously failed Tikosyn  and now on amiodarone .  - Cardioversion today.  - Continue IV amiodarone  overnight.  - Not an AF ablation candidate. Best course of action would be leadless pacemaker implant then AV node ablation.  If she maintains sinus overnight then can arrange for outpatient. If quick recurrence then she will remain inpatient until leadless implant can be performed.  - Continue Eliquis  5mg  BID for now. Will bridge with heparin  if inpatient pacemaker is to be pursued.   #. ESRD on HD - Volume status managed with dialysis.    For questions or updates, please contact [facility] Please consult [email or web address] for contact info under    Signed, [name], MD  03/22/2024 3:04 PM

## 2024-03-22 NOTE — ED Notes (Signed)
 Patient transported to X-ray

## 2024-03-23 DIAGNOSIS — I2510 Atherosclerotic heart disease of native coronary artery without angina pectoris: Secondary | ICD-10-CM | POA: Diagnosis not present

## 2024-03-23 DIAGNOSIS — I5032 Chronic diastolic (congestive) heart failure: Secondary | ICD-10-CM | POA: Diagnosis not present

## 2024-03-23 DIAGNOSIS — F419 Anxiety disorder, unspecified: Secondary | ICD-10-CM | POA: Diagnosis not present

## 2024-03-23 DIAGNOSIS — I4811 Longstanding persistent atrial fibrillation: Secondary | ICD-10-CM | POA: Diagnosis not present

## 2024-03-23 DIAGNOSIS — R9431 Abnormal electrocardiogram [ECG] [EKG]: Secondary | ICD-10-CM | POA: Diagnosis not present

## 2024-03-23 DIAGNOSIS — I4819 Other persistent atrial fibrillation: Secondary | ICD-10-CM | POA: Diagnosis not present

## 2024-03-23 DIAGNOSIS — I4891 Unspecified atrial fibrillation: Secondary | ICD-10-CM | POA: Diagnosis not present

## 2024-03-23 DIAGNOSIS — G4733 Obstructive sleep apnea (adult) (pediatric): Secondary | ICD-10-CM | POA: Diagnosis not present

## 2024-03-23 DIAGNOSIS — Z9889 Other specified postprocedural states: Secondary | ICD-10-CM | POA: Diagnosis not present

## 2024-03-23 DIAGNOSIS — N186 End stage renal disease: Secondary | ICD-10-CM | POA: Diagnosis not present

## 2024-03-23 LAB — COMPREHENSIVE METABOLIC PANEL WITH GFR
ALT: 11 U/L (ref 0–44)
AST: 20 U/L (ref 15–41)
Albumin: 3.1 g/dL — ABNORMAL LOW (ref 3.5–5.0)
Alkaline Phosphatase: 78 U/L (ref 38–126)
Anion gap: 15 (ref 5–15)
BUN: 41 mg/dL — ABNORMAL HIGH (ref 8–23)
CO2: 27 mmol/L (ref 22–32)
Calcium: 9.2 mg/dL (ref 8.9–10.3)
Chloride: 95 mmol/L — ABNORMAL LOW (ref 98–111)
Creatinine, Ser: 9.48 mg/dL — ABNORMAL HIGH (ref 0.44–1.00)
GFR, Estimated: 4 mL/min — ABNORMAL LOW (ref >60)
Glucose, Bld: 150 mg/dL — ABNORMAL HIGH (ref 70–99)
Potassium: 4.1 mmol/L (ref 3.5–5.1)
Sodium: 137 mmol/L (ref 135–145)
Total Bilirubin: 0.8 mg/dL (ref 0.0–1.2)
Total Protein: 6.8 g/dL (ref 6.5–8.1)

## 2024-03-23 LAB — CBC
HCT: 30.4 % — ABNORMAL LOW (ref 36.0–46.0)
Hemoglobin: 9.5 g/dL — ABNORMAL LOW (ref 12.0–15.0)
MCH: 31 pg (ref 26.0–34.0)
MCHC: 31.3 g/dL (ref 30.0–36.0)
MCV: 99.3 fL (ref 80.0–100.0)
Platelets: 108 K/uL — ABNORMAL LOW (ref 150–400)
RBC: 3.06 MIL/uL — ABNORMAL LOW (ref 3.87–5.11)
RDW: 15.8 % — ABNORMAL HIGH (ref 11.5–15.5)
WBC: 6.5 K/uL (ref 4.0–10.5)
nRBC: 0 % (ref 0.0–0.2)

## 2024-03-23 LAB — GLUCOSE, CAPILLARY
Glucose-Capillary: 150 mg/dL — ABNORMAL HIGH (ref 70–99)
Glucose-Capillary: 147 mg/dL — ABNORMAL HIGH (ref 70–99)
Glucose-Capillary: 168 mg/dL — ABNORMAL HIGH (ref 70–99)
Glucose-Capillary: 165 mg/dL — ABNORMAL HIGH (ref 70–99)

## 2024-03-23 MED ORDER — HEPARIN (PORCINE) 25000 UT/250ML-% IV SOLN
1400.0000 [IU]/h | INTRAVENOUS | Status: AC
  Administered 2024-03-23 – 2024-03-26 (×4): 1400 [IU]/h via INTRAVENOUS
  Filled 2024-03-23 (×4): qty 250

## 2024-03-23 MED ORDER — OXYCODONE HCL 5 MG PO TABS
5.0000 mg | ORAL_TABLET | ORAL | Status: AC | PRN
  Administered 2024-03-23 – 2024-03-24 (×3): 5 mg via ORAL
  Filled 2024-03-23 (×3): qty 1

## 2024-03-23 NOTE — Plan of Care (Signed)
   Problem: Education: Goal: Ability to describe self-care measures that may prevent or decrease complications (Diabetes Survival Skills Education) will improve Outcome: Progressing

## 2024-03-23 NOTE — Progress Notes (Signed)
 PHARMACY - ANTICOAGULATION CONSULT NOTE  Pharmacy Consult for heparin  Indication: atrial fibrillation  Allergies  Allergen Reactions   Bee Pollen Anaphylaxis and Other (See Comments)    UNCONFIRMED, but IS allergic to bee VENOM   Bee Venom Anaphylaxis   Sulfa Antibiotics Itching and Rash   Chlorhexidine      Unknown rxn.   Iodinated Contrast Media Nausea And Vomiting    Patient Measurements: Height: 5' 7 (170.2 cm) Weight: 131.5 kg (290 lb) IBW/kg (Calculated) : 61.6 HEPARIN  DW (KG): 93.4  Vital Signs: Temp: 98.8 F (37.1 C) (08/03 1135) Temp Source: Oral (08/03 1135) BP: 119/50 (08/03 0927) Pulse Rate: 74 (08/03 0927)  Labs: Recent Labs    03/22/24 0954 03/22/24 1002 03/22/24 1144 03/23/24 0336  HGB 10.4* 11.6*  --  9.5*  HCT 33.2* 34.0*  --  30.4*  PLT 116*  --   --  108*  CREATININE 7.45* 7.80*  --  9.48*  TROPONINIHS 13  --  12  --     Estimated Creatinine Clearance: 8.1 mL/min (A) (by C-G formula based on SCr of 9.48 mg/dL (H)).   Medical History: Past Medical History:  Diagnosis Date   Acute on chronic systolic CHF (congestive heart failure) (HCC) 12/21/2020   Allergic rhinitis    Anemia    Arthritis    Asthma    Atrial fibrillation with RVR (HCC) 11/09/2023   Chronic diastolic CHF (congestive heart failure) (HCC)    Chronic kidney disease    Dialysis Tu/TH/Sa   COPD (chronic obstructive pulmonary disease) (HCC)    COVID-19 03/28/2023   Depression    DM (diabetes mellitus) (HCC)    DVT (deep vein thrombosis) in pregnancy    Gastroenteritis 03/27/2023   GERD (gastroesophageal reflux disease)    HCAP (healthcare-associated pneumonia) 06/21/2022   HTN (hypertension)    Hyperlipidemia    Obesity    Pneumonia 04/2018   RIGHT LOBE   Sleep apnea    needs to be retested - to get new cpap    Medications:  Scheduled:   busPIRone   5 mg Oral BID   DULoxetine   60 mg Oral Daily   insulin  aspart  0-6 Units Subcutaneous TID WC   lidocaine   1 patch  Transdermal Q24H   metoprolol  tartrate  25 mg Oral BID   midodrine   10 mg Oral BID   pantoprazole   40 mg Oral Daily   rosuvastatin   10 mg Oral QHS   sodium chloride  flush  3 mL Intravenous Q12H    Assessment: 68 yo female with a PMH significant for HTN, HLD, DM, GERD, CAD, Afib, ESRD on HD, and diastolic HF. Patient was cardioverted on 8/2. Pharmacy was consulted to initiate heparin  for atrial fibrillation due to PPM procedure.  Patient was on apixaban , with the last dose being 8/3 AM.  Baseline CBC is stable. Will use aPTT to monitor heparin  until it correlated with heparin  level since recent apixaban  use.  Goal of Therapy:  Heparin  level 0.3-0.7 units/ml aPTT 66-102 seconds Monitor platelets by anticoagulation protocol: Yes   Plan:  Start heparin  infusion at 1400 units/hr at 2200 Check aPTT, HL, and CBC with AM labs  B. Maegan Laticha Ferrucci, PharmD PGY-1 Pharmacy Resident [facility] 03/23/2024 12:07 PM

## 2024-03-23 NOTE — Progress Notes (Signed)
  Progress Note  Patient Name: [name] Date of Encounter: 03/23/2024 [facility] Cardiologist: Shelda Bruckner, MD   Interval Summary   Cardioversion yesterday. Maintaining sinus on IV amiodarone . No acute overnight events. Patient reports feeling relatively well. No new or acute complaints.   Vital Signs Vitals:   03/22/24 2320 03/23/24 0407 03/23/24 0743 03/23/24 0927  BP: 101/67 (!) 99/55 (!) 88/75 (!) 119/50  Pulse: 67 67 70 74  Resp: 16 16 19    Temp: 97.9 F (36.6 C) 98.1 F (36.7 C) 97.7 F (36.5 C)   TempSrc: Oral Axillary Oral   SpO2: 96%   96%  Weight:      Height:        Intake/Output Summary (Last 24 hours) at 03/23/2024 1142 Last data filed at 03/23/2024 1102 Gross per 24 hour  Intake 1474.14 ml  Output 0 ml  Net 1474.14 ml      03/22/2024    9:43 AM 03/04/2024    8:50 PM 03/04/2024    8:44 PM  Last 3 Weights  Weight (lbs) 290 lb 292 lb 8.8 oz 292 lb 8.8 oz  Weight (kg) 131.543 kg 132.7 kg 132.7 kg      Telemetry/ECG  SR - Personally Reviewed  Physical Exam  General: Well developed, in no acute distress.  Neck: No JVD.  Cardiac: Normal rate, regular rhythm.  Resp: Normal work of breathing.  Ext: No edema.  Neuro: No gross focal deficits.  Psych: Normal affect.   Assessment & Plan  #. Persistent atrial fibrillation/flutter: Highly symptomatic. Limiting dialysis. Recurrent with fast rates despite AAD therapy - previously failed Tikosyn  and now on amiodarone . Numerous cardioversions.  - Continue IV amiodarone  for now. - Not an AF ablation candidate. Best course of action would be leadless pacemaker implant then AV node ablation. Patient would like to see if pacer can be done while admitted. Tomorrow, I will see when we can arrange for leadless pacer implant. NPO after midnight just in case, although not optimistic lab schedule will be able to accommodate.  - Transition Eliquis  to heparin  gtt in anticipation of possible leadless  pacer implant. Will consult with pharmacy.    #. ESRD on HD - Volume status managed with dialysis.   For questions or updates, please contact [facility] Please consult [email or web address] for contact info under       Signed, [name], MD

## 2024-03-23 NOTE — Evaluation (Signed)
 Clinical/Bedside Swallow Evaluation Patient Details  Name: [name] MRN: [id number] Date of Birth: [birth year]  Today's Date: 03/23/2024 Time: SLP Start Time (ACUTE ONLY): 1016 SLP Stop Time (ACUTE ONLY): 1028 SLP Time Calculation (min) (ACUTE ONLY): 12 min  Past Medical History:  Past Medical History:  Diagnosis Date   Acute on chronic systolic CHF (congestive heart failure) (HCC) 12/21/2020   Allergic rhinitis    Anemia    Arthritis    Asthma    Atrial fibrillation with RVR (HCC) 11/09/2023   Chronic diastolic CHF (congestive heart failure) (HCC)    Chronic kidney disease    Dialysis Tu/TH/Sa   COPD (chronic obstructive pulmonary disease) (HCC)    COVID-19 03/28/2023   Depression    DM (diabetes mellitus) (HCC)    DVT (deep vein thrombosis) in pregnancy    Gastroenteritis 03/27/2023   GERD (gastroesophageal reflux disease)    HCAP (healthcare-associated pneumonia) 06/21/2022   HTN (hypertension)    Hyperlipidemia    Obesity    Pneumonia 04/2018   RIGHT LOBE   Sleep apnea    needs to be retested - to get new cpap   Past Surgical History:  Past Surgical History:  Procedure Laterality Date   ABDOMINAL HYSTERECTOMY     AV FISTULA PLACEMENT Left 03/17/2022   Procedure: LEFT ARM ARTERIOVENOUS (AV) FISTULA CREATION;  Surgeon: Eliza Lonni RAMAN, MD;  Location: [facility] OR;  Service: Vascular;  Laterality: Left;   BIOPSY  01/12/2023   Procedure: BIOPSY;  Surgeon: Rollin Dover, MD;  Location: [facility];  Service: Gastroenterology;;   VASSIE STUDY  11/26/2020   Procedure: BUBBLE STUDY;  Surgeon: Lonni Slain, MD;  Location: [facility] ENDOSCOPY;  Service: Cardiovascular;;   CARDIOVERSION N/A 05/06/2020   Procedure: CARDIOVERSION;  Surgeon: Lonni Slain, MD;  Location: [facility] ENDOSCOPY;  Service: Cardiovascular;  Laterality: N/A;   CARDIOVERSION N/A 11/04/2020   Procedure: CARDIOVERSION;  Surgeon: Pietro Redell RAMAN, MD;  Location: [facility] ENDOSCOPY;  Service:  Cardiovascular;  Laterality: N/A;   CARDIOVERSION N/A 02/01/2021   Procedure: CARDIOVERSION;  Surgeon: Cherrie Toribio SAUNDERS, MD;  Location: [facility] ENDOSCOPY;  Service: Cardiovascular;  Laterality: N/A;   CARDIOVERSION N/A 02/09/2021   Procedure: CARDIOVERSION;  Surgeon: Cherrie Toribio SAUNDERS, MD;  Location: [facility] ENDOSCOPY;  Service: Cardiovascular;  Laterality: N/A;   CARDIOVERSION N/A 04/19/2021   Procedure: CARDIOVERSION;  Surgeon: Okey Vina GAILS, MD;  Location: [facility] ENDOSCOPY;  Service: Cardiovascular;  Laterality: N/A;   CARDIOVERSION N/A 11/25/2021   Procedure: CARDIOVERSION;  Surgeon: Cherrie Toribio SAUNDERS, MD;  Location: [facility] ENDOSCOPY;  Service: Cardiovascular;  Laterality: N/A;   CARDIOVERSION N/A 01/31/2022   Procedure: CARDIOVERSION;  Surgeon: Cherrie Toribio SAUNDERS, MD;  Location: [facility] ENDOSCOPY;  Service: Cardiovascular;  Laterality: N/A;   CARDIOVERSION N/A 07/23/2023   Procedure: CARDIOVERSION (CATH LAB);  Surgeon: Francyne Headland, MD;  Location: [facility] INVASIVE CV LAB;  Service: Cardiovascular;  Laterality: N/A;   CARDIOVERSION N/A 12/05/2023   Procedure: CARDIOVERSION;  Surgeon: Cherrie Toribio SAUNDERS, MD;  Location: [facility] INVASIVE CV LAB;  Service: Cardiovascular;  Laterality: N/A;   CATARACT EXTRACTION, BILATERAL     CHOLECYSTECTOMY     COLONOSCOPY WITH PROPOFOL  N/A 03/05/2015   Procedure: COLONOSCOPY WITH PROPOFOL ;  Surgeon: Dover Rollin, MD;  Location: [facility] ENDOSCOPY;  Service: Endoscopy;  Laterality: N/A;   DIAGNOSTIC LAPAROSCOPY     ESOPHAGOGASTRODUODENOSCOPY (EGD) WITH PROPOFOL  N/A 01/12/2023   Procedure: ESOPHAGOGASTRODUODENOSCOPY (EGD) WITH PROPOFOL ;  Surgeon: Rollin Dover, MD;  Location: [facility] ENDOSCOPY;  Service: Gastroenterology;  Laterality: N/A;   FISTULA  SUPERFICIALIZATION Left 03/17/2022   Procedure: FISTULA SUPERFICIALIZATION -LEFT;  Surgeon: Eliza Lonni RAMAN, MD;  Location: [facility] OR;  Service: Vascular;  Laterality: Left;   ingrown hallux Left    IR FLUORO GUIDE CV LINE RIGHT  03/14/2022   IR US   GUIDE VASC ACCESS RIGHT  03/14/2022   KNEE SURGERY     LEFT HEART CATH AND CORONARY ANGIOGRAPHY N/A 12/31/2017   Procedure: LEFT HEART CATH AND CORONARY ANGIOGRAPHY;  Surgeon: Levern Hutching, MD;  Location: [facility] INVASIVE CV LAB;  Service: Cardiovascular;  Laterality: N/A;   MAZE N/A 12/29/2020   Procedure: MAZE;  Surgeon: Kerrin Elspeth BROCKS, MD;  Location: [facility] OR;  Service: Open Heart Surgery;  Laterality: N/A;   MITRAL VALVE REPAIR N/A 12/29/2020   Procedure: MITRAL VALVE REPAIR USING CARBOMEDICS ANNULOFLEX RING SIZE 30;  Surgeon: Kerrin Elspeth BROCKS, MD;  Location: [facility] OR;  Service: Open Heart Surgery;  Laterality: N/A;   REVISON OF ARTERIOVENOUS FISTULA Left 01/05/2023   Procedure: REVISION OF LEFT ARTERIOVENOUS FISTULA WITH INTERPOSITION PTFE GRAFT;  Surgeon: Eliza Lonni RAMAN, MD;  Location: [facility] OR;  Service: Vascular;  Laterality: Left;  ELIQUIS  AND HOLD 48 X HOURS   RIGHT HEART CATH N/A 12/22/2020   Procedure: RIGHT HEART CATH;  Surgeon: Rolan Ezra RAMAN, MD;  Location: [facility] INVASIVE CV LAB;  Service: Cardiovascular;  Laterality: N/A;   RIGHT HEART CATH N/A 01/31/2021   Procedure: RIGHT HEART CATH;  Surgeon: Cherrie Toribio SAUNDERS, MD;  Location: [facility]VAS[facility] CV LAB;  Service: Cardiovascular;  Laterality: N/A;   RIGHT HEART CATH N/A 07/29/2021   Procedure: RIGHT HEART CATH;  Surgeon: Cherrie Toribio SAUNDERS, MD;  Location: [facility] CV LAB;  Service: Cardiovascular;  Laterality: N/A;   RIGHT HEART CATH N/A 11/24/2021   Procedure: RIGHT HEART CATH;  Surgeon: Cherrie Toribio SAUNDERS, MD;  Location: [facility] CV LAB;  Service: Cardiovascular;  Laterality: N/A;   RIGHT HEART CATH N/A 02/14/2022   Procedure: RIGHT HEART CATH;  Surgeon: Cherrie Toribio SAUNDERS, MD;  Location: [facility]VAS[facility] CV LAB;  Service: Cardiovascular;  Laterality: N/A;   RIGHT HEART CATH N/A 02/22/2022   Procedure: RIGHT HEART CATH;  Surgeon: Rolan Ezra RAMAN, MD;  Location: [facility] INVASIVE CV LAB;  Service: Cardiovascular;  Laterality: N/A;   TEE  WITHOUT CARDIOVERSION N/A 11/26/2020   Procedure: TRANSESOPHAGEAL ECHOCARDIOGRAM (TEE);  Surgeon: Lonni Slain, MD;  Location: [facility] ENDOSCOPY;  Service: Cardiovascular;  Laterality: N/A;   TEE WITHOUT CARDIOVERSION N/A 12/29/2020   Procedure: TRANSESOPHAGEAL ECHOCARDIOGRAM (TEE);  Surgeon: Kerrin Elspeth BROCKS, MD;  Location: [facility] OR;  Service: Open Heart Surgery;  Laterality: N/A;   TEE WITHOUT CARDIOVERSION N/A 01/20/2022   Procedure: TRANSESOPHAGEAL ECHOCARDIOGRAM (TEE);  Surgeon: Cherrie Toribio SAUNDERS, MD;  Location: [facility] ENDOSCOPY;  Service: Cardiovascular;  Laterality: N/A;   TRICUSPID VALVE REPLACEMENT N/A 12/29/2020   Procedure: 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 RING SIZE 34;  Surgeon: Kerrin Elspeth BROCKS, MD;  Location: [facility] OR;  Service: Open Heart Surgery;  Laterality: N/A;   HPI:  [name] is a 68 yo female presenting to ED 8/2 with tachycardia and hypotension. Admitted with A-fib with RVR. CXR negative. Seen by SLP x2 in the past with concerns for an esophageal component, although it does not appear that pt received OP f/u for her complaints per chart review. PMH includes HTN, DM, GERD, CAD, A-fib, chronic diastolic CHF, anxiety, depression, ESRD on HD, OSA on CPAP, COPD    Assessment / Plan / Recommendation  Clinical Impression  Pt reports difficulty swallowing for years consistent with globus sensation and coughing with solids. She has a history of GERD, for which she no longer takes medication. She fed herself consecutive sips of thin liquids and regular solids without overt s/s of dysphagia or aspiration. Provided education regarding esophageal precautions. May consider further esophageal w/u if clinically indicated (discussed with MD). Otherwise, ongoing SLP f/u is not necessary at this time. Continue current diet. Signing off. SLP Visit Diagnosis: Dysphagia, unspecified (R13.10)    Aspiration Risk  Mild aspiration risk    Diet Recommendation  Regular;Thin liquid    Liquid Administration via: Cup;Straw Medication Administration: Whole meds with liquid Supervision: Patient able to self feed Compensations: Slow rate;Small sips/bites Postural Changes: Seated upright at 90 degrees;Remain upright for at least 30 minutes after po intake    Other  Recommendations Recommended Consults: Consider esophageal assessment Oral Care Recommendations: Oral care BID     Assistance Recommended at Discharge    Functional Status Assessment Patient has not had a recent decline in their functional status  Frequency and Duration            Prognosis Prognosis for improved oropharyngeal function: Good      Swallow Study   General HPI: [name] is a 68 yo female presenting to ED 8/2 with tachycardia and hypotension. Admitted with A-fib with RVR. CXR negative. Seen by SLP x2 in the past with concerns for an esophageal component, although it does not appear that pt received OP f/u for her complaints per chart review. PMH includes HTN, DM, GERD, CAD, A-fib, chronic diastolic CHF, anxiety, depression, ESRD on HD, OSA on CPAP, COPD Type of Study: Bedside Swallow Evaluation Previous Swallow Assessment: see HPI Diet Prior to this Study: Regular;Thin liquids (Level 0) Temperature Spikes Noted: No Respiratory Status: Room air History of Recent Intubation: No Behavior/Cognition: Alert;Cooperative;Pleasant mood Oral Cavity Assessment: Within Functional Limits Oral Care Completed by SLP: No Oral Cavity - Dentition: Adequate natural dentition Vision: Functional for self-feeding Self-Feeding Abilities: Able to feed self Patient Positioning: Upright in bed Baseline Vocal Quality: Normal Volitional Cough: Strong Volitional Swallow: Able to elicit    Oral/Motor/Sensory Function Overall Oral Motor/Sensory Function: Within functional limits   Ice Chips Ice chips: Not tested   Thin Liquid Thin Liquid: Within functional limits Presentation:  Straw;Self Fed    Nectar Thick Nectar Thick Liquid: Not tested   Honey Thick Honey Thick Liquid: Not tested   Puree Puree: Not tested   Solid     Solid: Within functional limits Presentation: Self Fed      Damien Blumenthal, M.A., CCC-SLP Speech Language Pathology, Acute Rehabilitation Services  Secure Chat preferred [phone]  03/23/2024,12:00 PM

## 2024-03-23 NOTE — Progress Notes (Incomplete)
 Wasted 75ml of morphine  drip witnessed by Harlene

## 2024-03-23 NOTE — Progress Notes (Signed)
 Progress Note   Patient: [name] FMW:[id number] DOB: Aug 09, 1956 DOA: 03/22/2024     0 DOS: the patient was seen and examined on 03/23/2024   Brief hospital course: [name] is a 68 y.o. female with medical history significant of hypertension, hypertension, hyperlipidemia, diabetes, GERD, CAD, atrial fibrillation, chronic diastolic CHF, anxiety, depression, ESRD on HD, OSA on CPAP, obesity, COPD, QT prolongation, status post mitral valve and tricuspid valve repair, DVT presented with shortness of breath and tachycardia. Admitted to TRH service for persistent Afib, Aflutter. EP consulted.  Assessment and Plan: Atrial fibrillation/flutter with RVR Long history of A-fib/flutter with failed multiple cardioversions. Failed Tikosyn  in the past.   Appreciate EP recommendations and assistance. Continue with amiodarone  and heparin  infusion. HR well controlled. EP plan for leadless pacemaker implant then AV node ablation. NPO after midnight.   ESRD on HD Last dialysis session saturday, able to complete 2 hours before symptom onset of tachycardia and low blood pressure. Renal function and electrolytes currently stable.  No urgent indication for dialysis. Nephrology will be consulted Monday 8/4. Trend renal function and electrolytes Continue midodrine    Hypotension BP low but stable. Continue midodrine    Hyperlipidemia Continue rosuvastatin    Diabetes Accucheks, sliding scale insulin /   GERD Continue PPI   Chronic diastolic CHF Status post tricuspid valve repair and mitral valve repair Last echo in February 2025 with EF 55-60%, indeterminate diastolic function, mildly reduced RV function.  status post TV repair and MV repair. Volume management with dialysis Continue home dose metoprolol    Anxiety Depression Continue BuSpar , duloxetine    OSA Continue home CPAP   Morbid obesity BMI 45.42 Diet, exercise and weight reduction advised.   COPD Continue home PRN  albuterol    QTc prolongation Initial EKG with QTc of 527.  On repeat EKG QTc improved to 477.   History of DVT Heparin  drip for now, transition to Eliquis  upon discharge.     Out of bed to chair. Incentive spirometry. Nursing supportive care. Fall, aspiration precautions. Diet:  Diet Orders (From admission, onward)     Start     Ordered   03/24/24 0001  Diet NPO time specified  Diet effective midnight        03/23/24 1153   03/22/24 1532  Diet Heart Room service appropriate? Yes; Fluid consistency: Thin  Diet effective now       Question Answer Comment  Room service appropriate? Yes   Fluid consistency: Thin      03/22/24 1543           DVT prophylaxis:   Level of care: Progressive   Code Status: Full Code  Subjective: Patient is seen and examined today morning. She complained of back pain after fall 3 days ago. HR well controlled. Family at bedside.  Physical Exam: Vitals:   03/23/24 0743 03/23/24 0927 03/23/24 1135 03/23/24 1613  BP: (!) 88/75 (!) 119/50    Pulse: 70 74    Resp: 19  14 20   Temp: 97.7 F (36.5 C)  98.8 F (37.1 C) 98.8 F (37.1 C)  TempSrc: Oral  Oral Oral  SpO2:  96%    Weight:      Height:        General - Elderly morbidly obese African American female, distress due to back pain. HEENT - PERRLA, EOMI, atraumatic head, non tender sinuses. Lung - Clear, basal rales, rhonchi, wheezes. Heart - S1, S2 heard, no murmurs, rubs, trace pedal edema. Abdomen - Soft, non tender, obese, bowel  sounds good Neuro - Alert, awake and oriented x 3, non focal exam. Skin - Warm and dry.  Data Reviewed:      Latest Ref Rng & Units 03/23/2024    3:36 AM 03/22/2024   10:02 AM 03/22/2024    9:54 AM  CBC  WBC 4.0 - 10.5 K/uL 6.5   6.7   Hemoglobin 12.0 - 15.0 g/dL 9.5  88.3  89.5   Hematocrit 36.0 - 46.0 % 30.4  34.0  33.2   Platelets 150 - 400 K/uL 108   116       Latest Ref Rng & Units 03/23/2024    3:36 AM 03/22/2024   10:02 AM 03/22/2024    9:54 AM   BMP  Glucose 70 - 99 mg/dL 849  809  814   BUN 8 - 23 mg/dL 41  31  27   Creatinine 0.44 - 1.00 mg/dL 0.51  2.19  2.54   Sodium 135 - 145 mmol/L 137  137  136   Potassium 3.5 - 5.1 mmol/L 4.1  3.9  3.8   Chloride 98 - 111 mmol/L 95  96  94   CO2 22 - 32 mmol/L 27   30   Calcium  8.9 - 10.3 mg/dL 9.2   9.1    DG Lumbar Spine 2-3 Views Result Date: 03/22/2024 EXAM: 2 or 3 VIEW(S) XRAY OF THE LUMBAR SPINE 03/22/2024 11:57:00 AM COMPARISON: CT lumbar spine 06/18/2023 CLINICAL HISTORY: Fall. Reason for exam: fall SOB. FINDINGS: LUMBAR SPINE: BONES: No signs of lumbar spine fracture or subluxation. Compression deformity involving the T11 vertebra. DISCS AND DEGENERATIVE CHANGES: Mild lumbar degenerative disc disease and facet arthropathy. SOFT TISSUES: Cholecystectomy clips noted in the left upper quadrant of the abdomen. 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 IMPRESSION: 1. No acute lumbar spine fracture or subluxation. 2. Compression deformity involving the T11 vertebra, similar to prior exam. 3. Mild lumbar degenerative disc disease and facet arthropathy. Electronically signed by: Waddell Calk MD 03/22/2024 12:05 PM EDT RP Workstation: [id number]   DG Chest Portable 1 View Result Date: 03/22/2024 CLINICAL DATA:  Shortness of breath. EXAM: PORTABLE CHEST 1 VIEW COMPARISON:  03/03/2024 FINDINGS: Lungs are adequately inflated without focal airspace consolidation or effusion. No pneumothorax. Mild stable cardiomegaly. Remainder of the exam is unchanged. IMPRESSION: 1. No acute cardiopulmonary disease. 2. Mild stable cardiomegaly. Electronically Signed   By: Toribio Agreste M.D.   On: 03/22/2024 10:06    Family Communication: Discussed with patient, understand and agree. All questions answered.  Disposition: Status is: Observation The patient remains OBS appropriate and will d/c before 2 midnights.  Planned Discharge Destination: Home with Home  Health     Time spent: 42 minutes  Author: Concepcion Riser, MD 03/23/2024 7:20 PM Secure chat 7am to 7pm For on call review [email or web address].

## 2024-03-23 NOTE — Care Management Obs Status (Signed)
 MEDICARE OBSERVATION STATUS NOTIFICATION   Patient Details  Name: [name] MRN: [id number] Date of Birth: 1955-11-19   Medicare Observation Status Notification Given:  Yes    Cohen Boettner G., RN 03/23/2024, 9:34 AM

## 2024-03-23 NOTE — Progress Notes (Signed)
 Mobility Specialist Progress Note;   03/23/24 0947  Mobility  Activity Ambulated with assistance (Bathroom)  Level of Assistance Contact guard assist, steadying assist  Assistive Device Front wheel walker  Distance Ambulated (ft) 7 ft  Activity Response Tolerated well  Mobility Referral Yes  Mobility visit 1 Mobility  Mobility Specialist Start Time (ACUTE ONLY) [id number]  Mobility Specialist Stop Time (ACUTE ONLY) [id number]  Mobility Specialist Time Calculation (min) (ACUTE ONLY) 5 min   Answered pts call bell from bathroom. Required MinG assistance to safely ambulate in room back to bed. VSS throughout. Pt left comfortably in bed with all needs met, call bell in reach. Alarm on.   Lauraine Erm Mobility Specialist Please contact via SecureChat or [employer] [phone]

## 2024-03-24 DIAGNOSIS — I4811 Longstanding persistent atrial fibrillation: Secondary | ICD-10-CM | POA: Diagnosis not present

## 2024-03-24 DIAGNOSIS — I5032 Chronic diastolic (congestive) heart failure: Secondary | ICD-10-CM | POA: Diagnosis not present

## 2024-03-24 DIAGNOSIS — N186 End stage renal disease: Secondary | ICD-10-CM | POA: Diagnosis not present

## 2024-03-24 DIAGNOSIS — F419 Anxiety disorder, unspecified: Secondary | ICD-10-CM | POA: Diagnosis not present

## 2024-03-24 DIAGNOSIS — Z9889 Other specified postprocedural states: Secondary | ICD-10-CM | POA: Diagnosis not present

## 2024-03-24 DIAGNOSIS — R9431 Abnormal electrocardiogram [ECG] [EKG]: Secondary | ICD-10-CM | POA: Diagnosis not present

## 2024-03-24 DIAGNOSIS — I2510 Atherosclerotic heart disease of native coronary artery without angina pectoris: Secondary | ICD-10-CM | POA: Diagnosis not present

## 2024-03-24 DIAGNOSIS — I4819 Other persistent atrial fibrillation: Secondary | ICD-10-CM | POA: Diagnosis not present

## 2024-03-24 DIAGNOSIS — G4733 Obstructive sleep apnea (adult) (pediatric): Secondary | ICD-10-CM | POA: Diagnosis not present

## 2024-03-24 LAB — CBC
HCT: 30.4 % — ABNORMAL LOW (ref 36.0–46.0)
Hemoglobin: 9.7 g/dL — ABNORMAL LOW (ref 12.0–15.0)
MCH: 31.4 pg (ref 26.0–34.0)
MCHC: 31.9 g/dL (ref 30.0–36.0)
MCV: 98.4 fL (ref 80.0–100.0)
Platelets: 115 K/uL — ABNORMAL LOW (ref 150–400)
RBC: 3.09 MIL/uL — ABNORMAL LOW (ref 3.87–5.11)
RDW: 15.8 % — ABNORMAL HIGH (ref 11.5–15.5)
WBC: 6.6 K/uL (ref 4.0–10.5)
nRBC: 0 % (ref 0.0–0.2)

## 2024-03-24 LAB — HEPARIN LEVEL (UNFRACTIONATED): Heparin Unfractionated: >1.1 [IU]/mL — ABNORMAL HIGH (ref 0.30–0.70)

## 2024-03-24 LAB — GLUCOSE, CAPILLARY
Glucose-Capillary: 119 mg/dL — ABNORMAL HIGH (ref 70–99)
Glucose-Capillary: 139 mg/dL — ABNORMAL HIGH (ref 70–99)
Glucose-Capillary: 116 mg/dL — ABNORMAL HIGH (ref 70–99)
Glucose-Capillary: 185 mg/dL — ABNORMAL HIGH (ref 70–99)

## 2024-03-24 LAB — HEPATITIS B SURFACE ANTIGEN: Hepatitis B Surface Ag: NONREACTIVE

## 2024-03-24 LAB — APTT: aPTT: 101 s — ABNORMAL HIGH (ref 24–36)

## 2024-03-24 MED ORDER — ONDANSETRON 4 MG PO TBDP: 4.0000 mg | ORAL_TABLET | Freq: Three times a day (TID) | ORAL | Status: DC | PRN

## 2024-03-24 MED ORDER — TRIMETHOBENZAMIDE HCL 100 MG/ML IM SOLN
200.0000 mg | Freq: Four times a day (QID) | INTRAMUSCULAR | Status: DC | PRN
  Administered 2024-03-24 – 2024-03-27 (×4): 200 mg via INTRAMUSCULAR
  Filled 2024-03-24 (×5): qty 2

## 2024-03-24 MED ORDER — OXYCODONE HCL 5 MG PO TABS
2.5000 mg | ORAL_TABLET | Freq: Once | ORAL | Status: AC
  Administered 2024-03-24: 2.5 mg via ORAL
  Filled 2024-03-24: qty 1

## 2024-03-24 MED ORDER — ONDANSETRON 4 MG PO TBDP
4.0000 mg | ORAL_TABLET | Freq: Once | ORAL | Status: AC
  Administered 2024-03-24: 4 mg via ORAL
  Filled 2024-03-24: qty 1

## 2024-03-24 NOTE — Progress Notes (Signed)
 Progress Note   Patient: [name] FMW:[id number] DOB: 03-19-1956 DOA: 03/22/2024     0 DOS: the patient was seen and examined on 03/24/2024   Brief hospital course: AREATHA KALATA is a 68 y.o. female with medical history significant of hypertension, hypertension, hyperlipidemia, diabetes, GERD, CAD, atrial fibrillation, chronic diastolic CHF, anxiety, depression, ESRD on HD, OSA on CPAP, obesity, COPD, QT prolongation, status post mitral valve and tricuspid valve repair, DVT presented with shortness of breath and tachycardia. Admitted to TRH service for persistent Afib, Aflutter. EP consulted.  Assessment and Plan: Atrial fibrillation/flutter with RVR Long history of A-fib/flutter with failed multiple cardioversions. Failed Tikosyn  in the past.   Appreciate EP recommendations and assistance. Continue with amiodarone  and heparin  infusion. HR well controlled. EP plan for leadless pacemaker implant then AV node ablation.  EP follow up appreciated, no schedule for procedure planned yet. She wishes to get it done as inpatient. Will follow EP recommendations.   ESRD on HD Last dialysis session saturday, able to complete 2 hours before symptom onset of tachycardia and low blood pressure. Renal function and electrolytes currently stable.  No urgent indication for dialysis. Nephrology consulted for HD needs.   Hypotension BP low but stable. Continue midodrine    Hyperlipidemia Continue rosuvastatin    Diabetes Accucheks, sliding scale insulin /   GERD Continue PPI   Chronic diastolic CHF Status post tricuspid valve repair and mitral valve repair Last echo in February 2025 with EF 55-60%, indeterminate diastolic function, mildly reduced RV function.  status post TV repair and MV repair. Volume management with hemodialysis Continue home dose metoprolol    Anxiety Depression Continue BuSpar , duloxetine    OSA Continue home CPAP   Morbid obesity BMI 45.42 Diet, exercise  and weight reduction advised.   COPD Continue home PRN albuterol    QTc prolongation Initial EKG with QTc of 527.  On repeat EKG QTc improved to 477.   History of DVT Heparin  drip for now, transition to Eliquis  upon discharge.     Out of bed to chair. Incentive spirometry. Nursing supportive care. Fall, aspiration precautions. Diet:  Diet Orders (From admission, onward)     Start     Ordered   03/24/24 0708  Diet Heart Room service appropriate? Yes; Fluid consistency: Thin  Diet effective now       Question Answer Comment  Room service appropriate? Yes   Fluid consistency: Thin      03/24/24 0707           DVT prophylaxis: heparin  drip.  Level of care: Progressive   Code Status: Full Code  Subjective: Patient is seen and examined today morning. She has nausea, lower abdominal pain. Eating fair, had bowel movement yesterday. HR well controlled, tolerating amiodarone , heparin  gtt.  Physical Exam: Vitals:   03/23/24 2320 03/24/24 0425 03/24/24 0751 03/24/24 1200  BP: (!) 111/55 (!) 88/60 (!) 113/58 (!) 125/57  Pulse: 61 (!) 55 61 (!) 58  Resp: 16 20 20 16   Temp: 98.9 F (37.2 C) 97.8 F (36.6 C) 97.8 F (36.6 C) 97.9 F (36.6 C)  TempSrc: Oral Oral Oral Oral  SpO2: 95% 97% 99% 94%  Weight:      Height:        General - Elderly morbidly obese African American female, distress due to back pain. HEENT - PERRLA, EOMI, atraumatic head, non tender sinuses. Lung - Clear, basal rales, rhonchi, wheezes. Heart - S1, S2 heard, no murmurs, rubs, trace pedal edema. Abdomen - Soft, non tender, obese,  bowel sounds good Neuro - Alert, awake and oriented x 3, non focal exam. Skin - Warm and dry.  Data Reviewed:      Latest Ref Rng & Units 03/24/2024    4:44 AM 03/23/2024    3:36 AM 03/22/2024   10:02 AM  CBC  WBC 4.0 - 10.5 K/uL 6.6  6.5    Hemoglobin 12.0 - 15.0 g/dL 9.7  9.5  88.3   Hematocrit 36.0 - 46.0 % 30.4  30.4  34.0   Platelets 150 - 400 K/uL 115  108         Latest Ref Rng & Units 03/23/2024    3:36 AM 03/22/2024   10:02 AM 03/22/2024    9:54 AM  BMP  Glucose 70 - 99 mg/dL 849  809  814   BUN 8 - 23 mg/dL 41  31  27   Creatinine 0.44 - 1.00 mg/dL 0.51  2.19  2.54   Sodium 135 - 145 mmol/L 137  137  136   Potassium 3.5 - 5.1 mmol/L 4.1  3.9  3.8   Chloride 98 - 111 mmol/L 95  96  94   CO2 22 - 32 mmol/L 27   30   Calcium  8.9 - 10.3 mg/dL 9.2   9.1    No results found.   Family Communication: Discussed with patient, understand and agree. All questions answered.  Disposition: Status is: Observation The patient remains OBS appropriate and will d/c before 2 midnights.  Planned Discharge Destination: Home with Home Health     Time spent: 40 minutes  Author: Concepcion Riser, MD 03/24/2024 1:12 PM Secure chat 7am to 7pm For on call review [email or web address].

## 2024-03-24 NOTE — TOC Initial Note (Signed)
 Transition of Care [facility]) - Initial/Assessment Note    Patient Details  Name: [name] MRN: [id number] Date of Birth: Jan 06, 1956  [facility]) CM/SW Contact:    Sudie Erminio Deems, RN Phone Number: 03/24/2024, 1:32 PM  Clinical Narrative: Patient presented for tachycardia and shortness of breath. EP is following. PTA patient was from home alone with support of daughter and son. Patient has DME rolling walker, cane, and bedside commode. Patient is asking for a shower chair- Patient did not have a DME preference-Case Manager submitted order to [employer]. Patient states she had a sleep study a couple of months ago at [facility]; however, never received a CPAP. Case Manager did discuss with the provider and he is open to see if the patient can receive the CPAP for home. Referral submitted to [employer] to see if the patient is eligible. Patient was currently active with [facility] for RN/PT/OT; she will need orders to resume the services and F2F. Case Manager will continue to follow for additional disposition needs as the patient progresses.         Expected Discharge Plan: Home w Home Health Services Barriers to Discharge: Continued Medical Work up   Patient Goals and CMS Choice Patient states their goals for this hospitalization and ongoing recovery are:: Plan to return home once stable          Expected Discharge Plan and Services   Discharge Planning Services: CM Consult Post Acute Care Choice: Home Health, Resumption of Svcs/PTA Provider Living arrangements for the past 2 months: Apartment                 DME Arranged: Shower stool DME Agency: [employer]       HH Arranged: RN, Disease Management, OT, PT HH Agency: [name] Date [facility] Agency Contacted: 03/24/24 Time HH Agency Contacted: 1331 Representative spoke with at [facility] Agency: [place]  Prior Living Arrangements/Services Living arrangements for the past 2 months:  Apartment Lives with:: Self Patient language and need for interpreter reviewed:: Yes Do you feel safe going back to the place where you live?: Yes      Need for Family Participation in Patient Care: No (Comment) Care giver support system in place?: No (comment) Current home services: DME (cane, rollator, bedside commode.) Criminal Activity/Legal Involvement Pertinent to Current Situation/Hospitalization: No - Comment as needed  Activities of Daily Living   ADL Screening (condition at time of admission) Independently performs ADLs?: Yes (appropriate for developmental age) Is the patient deaf or have difficulty hearing?: No Does the patient have difficulty seeing, even when wearing glasses/contacts?: No Does the patient have difficulty concentrating, remembering, or making decisions?: No  Permission Sought/Granted Permission sought to share information with : Family Supports, Magazine features editor, Case Estate manager/land agent granted to share information with : Yes, Verbal Permission Granted     Permission granted to share info w AGENCY: [facility] and [employer]        Emotional Assessment Appearance:: Appears stated age Attitude/Demeanor/Rapport: Engaged Affect (typically observed): Appropriate Orientation: : Oriented to Self, Oriented to Place, Oriented to  Time, Oriented to Situation Alcohol  / Substance Use: Not Applicable Psych Involvement: No (comment)  Admission diagnosis:  Atrial flutter with rapid ventricular response (HCC) [I48.92] Atrial fibrillation with RVR (HCC) [I48.91] Patient Active Problem List   Diagnosis Date Noted   Atrial fibrillation with RVR (HCC) 03/22/2024   Persistent atrial fibrillation (HCC) 03/05/2024   CAD in native artery 03/04/2024   Costochondritis 12/27/2023  Precordial chest pain 12/27/2023   HLD (hyperlipidemia) 11/09/2023   Encounter for monitoring amiodarone  therapy 07/16/2023   Dyspnea 07/03/2023   Vertigo 03/17/2023   Hypotension  03/14/2023   Left sided numbness 03/14/2023   Screening for malignant neoplasm of colon 02/21/2023   Nausea and vomiting 02/21/2023   ESRD (end stage renal disease) (HCC) 06/21/2022   OSA on CPAP 06/21/2022   Class 3 obesity 03/18/2022   Chronic deep vein thrombosis (DVT) of other vein of right upper extremity (HCC) 12/29/2021   Abnormal weight loss 11/16/2021   History of colonic polyps 11/16/2021   Rib pain 11/16/2021   Anxiety and depression 08/26/2021   Essential hypertension 08/26/2021   GERD (gastroesophageal reflux disease) 08/26/2021   COPD (chronic obstructive pulmonary disease) (HCC) 07/29/2021   QT prolongation 07/29/2021   Chronic diastolic CHF (congestive heart failure) (HCC)    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   Teeth missing    Gingivitis    Accretions on teeth    NICM (nonischemic cardiomyopathy) (HCC) 04/01/2020   Family history of heart disease 04/01/2020   Mitral regurgitation 04/01/2020   Chest pain 02/26/2012   Type 2 diabetes mellitus with hyperlipidemia (HCC) 02/04/2010   Gastroparesis 02/04/2010   PCP:  Celestia Rosaline SQUIBB, NP Pharmacy:   [id number] - [place], [place] - [address] [place] [address] Phone: [phone] Fax: [phone]  [facility] - [place], [place] - [address] [place] [address] Phone: [phone] Fax: [phone]  [facility] [address] [place] [address] Phone: [phone] Fax: [phone]     Social Drivers of Health (SDOH) Social History: SDOH Screenings   Food Insecurity: No Food Insecurity (03/22/2024)  Housing: Low Risk  (03/22/2024)  Transportation Needs: No Transportation Needs (03/22/2024)  Utilities: Not At Risk (03/22/2024)  Alcohol  Screen: Low Risk  (06/04/2023)  Depression (PHQ2-9): Low Risk  (03/06/2024)  Financial Resource Strain: Low  Risk  (11/19/2023)  Physical Activity: Insufficiently Active (06/04/2023)  Social Connections: Moderately Isolated (03/22/2024)  Stress: No Stress Concern Present (06/04/2023)  Tobacco Use: Low Risk  (03/22/2024)  Health Literacy: Patient Declined (06/04/2023)   SDOH Interventions:     Readmission Risk Interventions    03/05/2024   12:22 PM 11/12/2023    4:32 PM 03/16/2023    3:57 PM  Readmission Risk Prevention Plan  Transportation Screening Complete Complete Complete  PCP or Specialist Appt within 3-5 Days Complete    HRI or Home Care Consult Complete Complete   Social Work Consult for Recovery Care Planning/Counseling Complete Complete   Palliative Care Screening Not Applicable Not Applicable   Medication Review Oceanographer) Referral to Pharmacy Referral to Pharmacy Referral to Pharmacy  PCP or Specialist appointment within 3-5 days of discharge   Complete  HRI or Home Care Consult   Complete  SW Recovery Care/Counseling Consult   Complete  Palliative Care Screening   Not Applicable  Skilled Nursing Facility   Not Applicable

## 2024-03-24 NOTE — Progress Notes (Addendum)
 Patient Name: [name] Date of Encounter: 03/24/2024  Primary Cardiologist: Shelda Bruckner, MD Electrophysiologist: CAMERON T LAMBERT, MD  Interval Summary   The patient is doing well today.  At this time, the patient denies chest pain, shortness of breath, or any new concerns.  Vital Signs    Vitals:   03/23/24 2312 03/23/24 2320 03/24/24 0425 03/24/24 0751  BP:  (!) 111/55 (!) 88/60 (!) 113/58  Pulse: 63 61 (!) 55 61  Resp:  16 20 20   Temp:  98.9 F (37.2 C) 97.8 F (36.6 C) 97.8 F (36.6 C)  TempSrc:  Oral Oral Oral  SpO2: 94% 95% 97% 99%  Weight:      Height:        Intake/Output Summary (Last 24 hours) at 03/24/2024 0915 Last data filed at 03/24/2024 9375 Gross per 24 hour  Intake 1184.37 ml  Output --  Net 1184.37 ml   Filed Weights   03/22/24 0943  Weight: 131.5 kg    Physical Exam    GEN- NAD, Alert and oriented  Lungs- Clear to ausculation bilaterally, normal work of breathing Cardiac- Regular rate and rhythm, no murmurs, rubs or gallops GI- soft, NT, ND, + BS Extremities- no clubbing or cyanosis. No edema  Telemetry    NSR at 60 bpm (personally reviewed)  Hospital Course    [name] is a 68 y.o. female with h/o  hypertension, hypertension, hyperlipidemia, diabetes, GERD, CAD, atrial fibrillation, chronic diastolic CHF, anxiety, depression, ESRD on HD, OSA on CPAP, obesity, COPD, QT prolongation, status post mitral valve and tricuspid valve repair, DVT presented with shortness of breath and tachycardia.   Assessment & Plan    Persistent atrial fibrillation/flutter:  Highly symptomatic.  Limits dialysis.  Poorly controlled despite AAD - previously failed Tikosyn  and now on amiodarone  and failed multiple DCCs.  Continue IV amiodarone  for now pending disposition  No AF ablation candidate  Consider Leadless PPM -> AV nodal ablation.   On heparin  as bridge for possible procedures.   Discussed with pt increasing  census and pts awaiting PPM. Her case is non-urgent with currently stable rates and can be scheduled to come back as an outpatient; She prefers to wait as in inpatient, verbalizing understanding that no firm date can be given at this time.    ESRD on HD Volume status managed with dialysis.   For questions or updates, please contact [facility] Please consult [email or web address] for contact info under     Signed, Ozell Prentice Passey, PA-C  03/24/2024, 9:15 AM     I have seen, examined the patient, and reviewed the above assessment and plan.    Interval:  No acute overnight events. Patient reports feeling relatively well. No new or acute complaints.   Maintaining sinus rhythm.   General: Well developed, in no acute distress.  Neck: No JVD.  Cardiac: Bradycardic, regular rhythm.  Resp: Normal work of breathing.  Ext: No edema.  Neuro: No gross focal deficits.  Psych: Normal affect.   Assessment and Plan: #. Persistent atrial fibrillation/flutter: Highly symptomatic. Limiting dialysis. Recurrent with fast rates despite AAD therapy - previously failed Tikosyn  and now on amiodarone . Numerous cardioversions.  - Continue IV amiodarone  for now. Decreased to 30mg /hr. Will likely transition to oral amiodarone  tomorrow.  - Not an AF ablation candidate. Best course of action would be leadless pacemaker implant then AV node ablation. Patient would like to see if pacer can be done while admitted. We are still working to  see if we can accommodate this week.  - Continue heparin  gtt now for anti-coagulation in anticipation of leadless pacer.    #. ESRD on HD - Volume status managed with dialysis.    [name], MD 03/24/2024 9:46 PM

## 2024-03-24 NOTE — Plan of Care (Signed)
   Problem: Fluid Volume: Goal: Ability to maintain a balanced intake and output will improve Outcome: Progressing   Problem: Nutritional: Goal: Maintenance of adequate nutrition will improve Outcome: Progressing

## 2024-03-24 NOTE — Consult Note (Addendum)
 Renal Service Consult Note [facility]  [name] 03/24/2024 Leslie JONETTA Fret, MD Requesting Physician: Dr. Concepcion  Reason for Consult: ESRD pt w/ high heart rate and hypotension HPI: The patient is a 68 y.o. year-old w/ PMH as below who presented to ED sent from HD ib Sat (8/02) morning. After 2 hrs of dialysis BP's dropped and HR was high in 100-180 range. Pt had some L arm pain. In ED HR 110-150, BP 90-120, creat 7.35, Hb 10, BNP 211. Trop negative. CXR negative. Cardiology consulted and started IV amiodarone  and then did successful cardioversion in the ED.  She has long hx of afib cardioversions.    Pt seen in hospital room. No c/o's. Denies any SOB, CP. Has had some N/V.    ROS - denies CP, no joint pain, no HA, no blurry vision, no rash, no diarrhea, no nausea/ vomiting   Past Medical History  Past Medical History:  Diagnosis Date   Acute on chronic systolic CHF (congestive heart failure) (HCC) 12/21/2020   Allergic rhinitis    Anemia    Arthritis    Asthma    Atrial fibrillation with RVR (HCC) 11/09/2023   Chronic diastolic CHF (congestive heart failure) (HCC)    Chronic kidney disease    Dialysis Tu/TH/Sa   COPD (chronic obstructive pulmonary disease) (HCC)    COVID-19 03/28/2023   Depression    DM (diabetes mellitus) (HCC)    DVT (deep vein thrombosis) in pregnancy    Gastroenteritis 03/27/2023   GERD (gastroesophageal reflux disease)    HCAP (healthcare-associated pneumonia) 06/21/2022   HTN (hypertension)    Hyperlipidemia    Obesity    Pneumonia 04/2018   RIGHT LOBE   Sleep apnea    needs to be retested - to get new cpap   Past Surgical History  Past Surgical History:  Procedure Laterality Date   ABDOMINAL HYSTERECTOMY     AV FISTULA PLACEMENT Left 03/17/2022   Procedure: LEFT ARM ARTERIOVENOUS (AV) FISTULA CREATION;  Surgeon: Eliza Lonni RAMAN, MD;  Location: [facility] OR;  Service: Vascular;  Laterality: Left;   BIOPSY   01/12/2023   Procedure: BIOPSY;  Surgeon: Rollin Dover, MD;  Location: [facility];  Service: Gastroenterology;;   VASSIE STUDY  11/26/2020   Procedure: BUBBLE STUDY;  Surgeon: Lonni Slain, MD;  Location: [facility] ENDOSCOPY;  Service: Cardiovascular;;   CARDIOVERSION N/A 05/06/2020   Procedure: CARDIOVERSION;  Surgeon: Lonni Slain, MD;  Location: [facility] ENDOSCOPY;  Service: Cardiovascular;  Laterality: N/A;   CARDIOVERSION N/A 11/04/2020   Procedure: CARDIOVERSION;  Surgeon: Pietro Redell RAMAN, MD;  Location: [facility] ENDOSCOPY;  Service: Cardiovascular;  Laterality: N/A;   CARDIOVERSION N/A 02/01/2021   Procedure: CARDIOVERSION;  Surgeon: Cherrie Toribio SAUNDERS, MD;  Location: [facility] ENDOSCOPY;  Service: Cardiovascular;  Laterality: N/A;   CARDIOVERSION N/A 02/09/2021   Procedure: CARDIOVERSION;  Surgeon: Cherrie Toribio SAUNDERS, MD;  Location: [facility] ENDOSCOPY;  Service: Cardiovascular;  Laterality: N/A;   CARDIOVERSION N/A 04/19/2021   Procedure: CARDIOVERSION;  Surgeon: Okey Vina GAILS, MD;  Location: [facility] ENDOSCOPY;  Service: Cardiovascular;  Laterality: N/A;   CARDIOVERSION N/A 11/25/2021   Procedure: CARDIOVERSION;  Surgeon: Cherrie Toribio SAUNDERS, MD;  Location: [facility] ENDOSCOPY;  Service: Cardiovascular;  Laterality: N/A;   CARDIOVERSION N/A 01/31/2022   Procedure: CARDIOVERSION;  Surgeon: Cherrie Toribio SAUNDERS, MD;  Location: [facility] ENDOSCOPY;  Service: Cardiovascular;  Laterality: N/A;   CARDIOVERSION N/A 07/23/2023   Procedure: CARDIOVERSION (CATH LAB);  Surgeon: Francyne Headland, MD;  Location: [facility] INVASIVE CV  LAB;  Service: Cardiovascular;  Laterality: N/A;   CARDIOVERSION N/A 12/05/2023   Procedure: CARDIOVERSION;  Surgeon: Cherrie Toribio SAUNDERS, MD;  Location: [facility] INVASIVE CV LAB;  Service: Cardiovascular;  Laterality: N/A;   CATARACT EXTRACTION, BILATERAL     CHOLECYSTECTOMY     COLONOSCOPY WITH PROPOFOL  N/A 03/05/2015   Procedure: COLONOSCOPY WITH PROPOFOL ;  Surgeon: Belvie Just, MD;  Location: [facility] ENDOSCOPY;   Service: Endoscopy;  Laterality: N/A;   DIAGNOSTIC LAPAROSCOPY     ESOPHAGOGASTRODUODENOSCOPY (EGD) WITH PROPOFOL  N/A 01/12/2023   Procedure: ESOPHAGOGASTRODUODENOSCOPY (EGD) WITH PROPOFOL ;  Surgeon: Just Belvie, MD;  Location: [facility] ENDOSCOPY;  Service: Gastroenterology;  Laterality: N/A;   FISTULA SUPERFICIALIZATION Left 03/17/2022   Procedure: FISTULA SUPERFICIALIZATION -LEFT;  Surgeon: Eliza Lonni RAMAN, MD;  Location: [facility] OR;  Service: Vascular;  Laterality: Left;   ingrown hallux Left    IR FLUORO GUIDE CV LINE RIGHT  03/14/2022   IR US  GUIDE VASC ACCESS RIGHT  03/14/2022   KNEE SURGERY     LEFT HEART CATH AND CORONARY ANGIOGRAPHY N/A 12/31/2017   Procedure: LEFT HEART CATH AND CORONARY ANGIOGRAPHY;  Surgeon: Levern Hutching, MD;  Location: [facility] INVASIVE CV LAB;  Service: Cardiovascular;  Laterality: N/A;   MAZE N/A 12/29/2020   Procedure: MAZE;  Surgeon: Kerrin Elspeth BROCKS, MD;  Location: [facility] OR;  Service: Open Heart Surgery;  Laterality: N/A;   MITRAL VALVE REPAIR N/A 12/29/2020   Procedure: MITRAL VALVE REPAIR USING CARBOMEDICS ANNULOFLEX RING SIZE 30;  Surgeon: Kerrin Elspeth BROCKS, MD;  Location: [facility] OR;  Service: Open Heart Surgery;  Laterality: N/A;   REVISON OF ARTERIOVENOUS FISTULA Left 01/05/2023   Procedure: REVISION OF LEFT ARTERIOVENOUS FISTULA WITH INTERPOSITION PTFE GRAFT;  Surgeon: Eliza Lonni RAMAN, MD;  Location: [facility] OR;  Service: Vascular;  Laterality: Left;  ELIQUIS  AND HOLD 48 X HOURS   RIGHT HEART CATH N/A 12/22/2020   Procedure: RIGHT HEART CATH;  Surgeon: Rolan Ezra RAMAN, MD;  Location: [facility] INVASIVE CV LAB;  Service: Cardiovascular;  Laterality: N/A;   RIGHT HEART CATH N/A 01/31/2021   Procedure: RIGHT HEART CATH;  Surgeon: Cherrie Toribio SAUNDERS, MD;  Location: [facility] INVASIVE CV LAB;  Service: Cardiovascular;  Laterality: N/A;   RIGHT HEART CATH N/A 07/29/2021   Procedure: RIGHT HEART CATH;  Surgeon: Cherrie Toribio SAUNDERS, MD;  Location: [facility] INVASIVE CV LAB;  Service:  Cardiovascular;  Laterality: N/A;   RIGHT HEART CATH N/A 11/24/2021   Procedure: RIGHT HEART CATH;  Surgeon: Cherrie Toribio SAUNDERS, MD;  Location: [facility] INVASIVE CV LAB;  Service: Cardiovascular;  Laterality: N/A;   RIGHT HEART CATH N/A 02/14/2022   Procedure: RIGHT HEART CATH;  Surgeon: Cherrie Toribio SAUNDERS, MD;  Location: [facility] INVASIVE CV LAB;  Service: Cardiovascular;  Laterality: N/A;   RIGHT HEART CATH N/A 02/22/2022   Procedure: RIGHT HEART CATH;  Surgeon: Rolan Ezra RAMAN, MD;  Location: [facility] INVASIVE CV LAB;  Service: Cardiovascular;  Laterality: N/A;   TEE WITHOUT CARDIOVERSION N/A 11/26/2020   Procedure: TRANSESOPHAGEAL ECHOCARDIOGRAM (TEE);  Surgeon: Lonni Slain, MD;  Location: [facility] ENDOSCOPY;  Service: Cardiovascular;  Laterality: N/A;   TEE WITHOUT CARDIOVERSION N/A 12/29/2020   Procedure: TRANSESOPHAGEAL ECHOCARDIOGRAM (TEE);  Surgeon: Kerrin Elspeth BROCKS, MD;  Location: [facility] OR;  Service: Open Heart Surgery;  Laterality: N/A;   TEE WITHOUT CARDIOVERSION N/A 01/20/2022   Procedure: TRANSESOPHAGEAL ECHOCARDIOGRAM (TEE);  Surgeon: Cherrie Toribio SAUNDERS, MD;  Location: [facility] ENDOSCOPY;  Service: Cardiovascular;  Laterality: N/A;   TRICUSPID VALVE REPLACEMENT N/A 12/29/2020   Procedure: TRICUSPID  VALVE REPAIR WITH EDWARDS MC3 TRICUSPID RING SIZE 34;  Surgeon: Kerrin Elspeth BROCKS, MD;  Location: [facility] OR;  Service: Open Heart Surgery;  Laterality: N/A;   Family History  Family History  Problem Relation Age of Onset   Breast cancer Mother    Cancer Mother    Hypertension Mother    Cancer Father    Hypertension Sister    Hypertension Brother    CAD Other    Social History  reports that she has never smoked. She has never been exposed to tobacco smoke. She has never used smokeless tobacco. She reports that she does not drink alcohol  and does not use drugs. Allergies  Allergies  Allergen Reactions   Bee Pollen Anaphylaxis and Other (See Comments)    UNCONFIRMED, but IS allergic to bee VENOM   Bee  Venom Anaphylaxis   Sulfa Antibiotics Itching and Rash   Chlorhexidine      Unknown rxn.   Iodinated Contrast Media Nausea And Vomiting   Home medications Prior to Admission medications   Medication Sig Start Date End Date Taking? Authorizing Provider  Accu-Chek Softclix Lancets lancets Use to check blood sugar 3 times daily. 01/01/24  Yes Newlin, Enobong, MD  acetaminophen  (TYLENOL ) 325 MG tablet Take 2 tablets (650 mg total) by mouth every 6 (six) hours as needed for mild pain or moderate pain. 12/07/21  Yes Arrien, Elidia Sieving, MD  albuterol  (PROVENTIL  HFA) 108 [phone] Base) MCG/ACT inhaler Inhale 1-2 puffs into the lungs every 6 (six) hours as needed for wheezing or shortness of breath. 10/13/22  Yes Celestia Rosaline SQUIBB, NP  amiodarone  (PACERONE ) 200 MG tablet Take 1 tablet (200 mg total) by mouth daily. 11/23/23  Yes Bensimhon, Sieving SAUNDERS, MD  AURYXIA  1 GM 210 MG(Fe) tablet Take 210 mg by mouth 3 (three) times daily.   Yes [provider]  Blood Glucose Monitoring Suppl (ACCU-CHEK GUIDE) w/Device KIT Use to check blood sugar 3 times daily. 01/01/24  Yes Newlin, Corrina, MD  busPIRone  (BUSPAR ) 5 MG tablet TAKE ONE TABLET BY MOUTH TWICE DAILY @9AM -5PM 02/14/24  Yes Celestia Rosaline SQUIBB, NP  diphenhydrAMINE  (BENADRYL ) 25 MG tablet Take 25 mg by mouth every 6 (six) hours as needed for itching. Patient taking differently: Take 25 mg by mouth as needed for itching. 03/31/22  Yes [provider]  doxercalciferol  (HECTOROL ) 4 MCG/2ML injection Inject 1.5 mLs (3 mcg total) into the vein Every Tuesday,Thursday,and Saturday with dialysis. 03/06/24  Yes Amin, Ankit C, MD  DULoxetine  (CYMBALTA ) 60 MG capsule TAKE ONE CAPSULE (60MG  TOTAL) BY MOUTH DAILY AT 9AM 02/29/24  Yes Edwards, Rosaline SQUIBB, NP  ELIQUIS  5 MG TABS tablet TAKE ONE TABLET (5MG  TOTAL) BY MOUTH TWICE DAILY @9AM -5PM 09/06/23  Yes Bensimhon, Sieving SAUNDERS, MD  EPINEPHrine  0.3 mg/0.3 mL IJ SOAJ injection Inject 0.3 mg into the muscle as needed  for anaphylaxis. 06/25/20  Yes Celestia Rosaline SQUIBB, NP  fluticasone  (FLONASE  ALLERGY RELIEF) 50 MCG/ACT nasal spray Place 2 sprays into both nostrils daily as needed for allergies. Patient taking differently: Place 2 sprays into both nostrils as needed for allergies. 03/31/22  Yes [provider]  glucose blood (ACCU-CHEK GUIDE TEST) test strip Use to check blood sugar 3 times daily. 01/01/24  Yes Newlin, Enobong, MD  insulin  glargine (LANTUS  SOLOSTAR) 100 UNIT/ML Solostar Pen Inject 10 Units into the skin daily. 09/27/23  Yes Celestia Rosaline SQUIBB, NP  insulin  lispro (HUMALOG  KWIKPEN) 100 UNIT/ML KwikPen For blood sugars 0-150 give 0 units of insulin , 151-200  give 2 units of insulin , 201-250 give 4 units, 251-300 give 6 units, 301-350 give 8 units, 351-400 give 10 units,> 400 give 12 units and call M.D. 09/27/23  Yes Newlin, Enobong, MD  lidocaine -prilocaine  (EMLA ) cream Apply 1 Application topically Every Tuesday,Thursday,and Saturday with dialysis. 06/01/22  Yes [provider]  loratadine  (CLARITIN ) 10 MG tablet Take 1 tablet (10 mg total) by mouth every morning. 02/08/24  Yes Celestia Rosaline SQUIBB, NP  Methoxy PEG-Epoetin  Beta (MIRCERA IJ) 50 mcg. 01/29/24 01/27/25 Yes [provider]  metoCLOPramide  (REGLAN ) 5 MG tablet Take 1 tablet (5 mg total) by mouth every 8 (eight) hours as needed for nausea. 07/10/23  Yes Celestia Rosaline SQUIBB, NP  metoprolol  tartrate (LOPRESSOR ) 25 MG tablet Take 1 tablet (25 mg total) by mouth 2 (two) times daily. 01/25/24  Yes Vannie Reche RAMAN, NP  midodrine  (PROAMATINE ) 10 MG tablet Take 1 tablet (10 mg total) by mouth 2 (two) times daily. 03/05/24  Yes Amin, Ankit C, MD  Multiple Vitamins-Minerals (MULTIVITAMIN WOMEN PO) Take 1 tablet by mouth daily.   Yes [provider]  nitroGLYCERIN  (NITROSTAT ) 0.4 MG SL tablet Place 1 tablet (0.4 mg total) under the tongue every 5 (five) minutes x 3 doses as needed for chest pain. 11/08/23  Yes Lonni Slain, MD  pantoprazole  (PROTONIX ) 40 MG tablet Take 1 tablet (40 mg total) by mouth daily. 04/24/23  Yes Celestia Rosaline SQUIBB, NP  polyethylene glycol powder (MIRALAX ) 17 GM/SCOOP powder Take 17 g by mouth daily as needed (constipation.). 06/26/22  Yes [provider]  rosuvastatin  (CRESTOR ) 10 MG tablet Take 10 mg by mouth at bedtime. 02/28/24  Yes [provider]     Vitals:   03/23/24 2312 03/23/24 2320 03/24/24 0425 03/24/24 0751  BP:  (!) 111/55 (!) 88/60 (!) 113/58  Pulse: 63 61 (!) 55 61  Resp:  16 20 20   Temp:  98.9 F (37.2 C) 97.8 F (36.6 C) 97.8 F (36.6 C)  TempSrc:  Oral Oral Oral  SpO2: 94% 95% 97% 99%  Weight:      Height:       Exam Gen alert, no distress, on RA No rash, cyanosis or gangrene Sclera anicteric, throat clear  No jvd or bruits Chest clear bilat to bases, no rales/ wheezing RRR no MRG Abd soft ntnd no mass or ascites +bs GU deferred MS no joint effusions or deformity Ext no pitting LE or UE edema, no other edema Neuro is alert, Ox 3 , nf     LUA AVF+ bruit   Home bp meds: Metoprolol  25 bid Midodrine  10mg  bid   OP HD: TTS SW 4h  B400  123.8kg  2K   AVF  Heparin  2500 Last full OP HD 7/31, post HD wt 127.5kg Comes off anywhere from 2-5kg over     Assessment/ Plan: Afib/ flutter: w/ borderline hypotension, HR 110-150. Pt was given IV amio and cardioverted by cardiology (hx of same). Per cardiology considering leadless PPM then AV nodal ablation. On IV heparin / amio.  ESRD: on HD TTS. Last HD Sat x 2 hrs. Plan HD tomorrow.  BP: getting her home metoprolol  and midodrine  as at home.  Volume: CXR clear, exam w/o gross edema, on RA, up 4-5kg at minimum. Max UF w/ HD. Anemia of esrd: 9-12 Hgb, follow.        Myer Fret  MD [facility] 03/24/2024, 9:10 AM  Recent Labs  Lab 03/22/24 0954 03/22/24 1002 03/23/24 0336 03/24/24 0444  HGB 10.4*  11.6* 9.5* 9.7*  ALBUMIN  3.7  --  3.1*  --   CALCIUM  9.1  --  9.2  --   CREATININE  7.45* 7.80* 9.48*  --   K 3.8 3.9 4.1  --    Inpatient medications:  busPIRone   5 mg Oral BID   DULoxetine   60 mg Oral Daily   insulin  aspart  0-6 Units Subcutaneous TID WC   lidocaine   1 patch Transdermal Q24H   metoprolol  tartrate  25 mg Oral BID   midodrine   10 mg Oral BID   pantoprazole   40 mg Oral Daily   rosuvastatin   10 mg Oral QHS   sodium chloride  flush  3 mL Intravenous Q12H    amiodarone  30 mg/hr (03/24/24 0743)   heparin  1,400 Units/hr (03/24/24 0624)   acetaminophen  **OR** acetaminophen , albuterol , naLOXone  (NARCAN )  injection, oxyCODONE , polyethylene glycol

## 2024-03-24 NOTE — Progress Notes (Signed)
 PHARMACY - ANTICOAGULATION CONSULT NOTE  Pharmacy Consult for heparin  Indication: atrial fibrillation  Allergies  Allergen Reactions   Bee Pollen Anaphylaxis and Other (See Comments)    UNCONFIRMED, but IS allergic to bee VENOM   Bee Venom Anaphylaxis   Sulfa Antibiotics Itching and Rash   Chlorhexidine      Unknown rxn.   Iodinated Contrast Media Nausea And Vomiting    Patient Measurements: Height: 5' 7 (170.2 cm) Weight: 131.5 kg (290 lb) IBW/kg (Calculated) : 61.6 HEPARIN  DW (KG): 93.4  Vital Signs: Temp: 97.8 F (36.6 C) (08/04 0751) Temp Source: Oral (08/04 0751) BP: 113/58 (08/04 0751) Pulse Rate: 61 (08/04 0751)  Labs: Recent Labs    03/22/24 0954 03/22/24 1002 03/22/24 1144 03/23/24 0336 03/24/24 0444 03/24/24 0835  HGB 10.4* 11.6*  --  9.5* 9.7*  --   HCT 33.2* 34.0*  --  30.4* 30.4*  --   PLT 116*  --   --  108* 115*  --   APTT  --   --   --   --   --  101*  HEPARINUNFRC  --   --   --   --   --  >1.10*  CREATININE 7.45* 7.80*  --  9.48*  --   --   TROPONINIHS 13  --  12  --   --   --     Estimated Creatinine Clearance: 8.1 mL/min (A) (by C-G formula based on SCr of 9.48 mg/dL (H)).   Medical History: Past Medical History:  Diagnosis Date   Acute on chronic systolic CHF (congestive heart failure) (HCC) 12/21/2020   Allergic rhinitis    Anemia    Arthritis    Asthma    Atrial fibrillation with RVR (HCC) 11/09/2023   Chronic diastolic CHF (congestive heart failure) (HCC)    Chronic kidney disease    Dialysis Tu/TH/Sa   COPD (chronic obstructive pulmonary disease) (HCC)    COVID-19 03/28/2023   Depression    DM (diabetes mellitus) (HCC)    DVT (deep vein thrombosis) in pregnancy    Gastroenteritis 03/27/2023   GERD (gastroesophageal reflux disease)    HCAP (healthcare-associated pneumonia) 06/21/2022   HTN (hypertension)    Hyperlipidemia    Obesity    Pneumonia 04/2018   RIGHT LOBE   Sleep apnea    needs to be retested - to get new  cpap    Medications:  Scheduled:   busPIRone   5 mg Oral BID   DULoxetine   60 mg Oral Daily   insulin  aspart  0-6 Units Subcutaneous TID WC   lidocaine   1 patch Transdermal Q24H   metoprolol  tartrate  25 mg Oral BID   midodrine   10 mg Oral BID   pantoprazole   40 mg Oral Daily   rosuvastatin   10 mg Oral QHS   sodium chloride  flush  3 mL Intravenous Q12H    Assessment: 68 yo female with a PMH significant for HTN, HLD, DM, GERD, CAD, Afib, ESRD on HD, and diastolic HF. Patient was cardioverted on 8/2. Pharmacy was consulted to initiate heparin  for atrial fibrillation due to PPM procedure.  Patient was on apixaban , with the last dose being 8/3 AM. -aPTT 101 and at goal -Heparin  level > 1.1 due to recent apixaban    Goal of Therapy:  Heparin  level 0.3-0.7 units/ml aPTT 66-102 seconds Monitor platelets by anticoagulation protocol: Yes   Plan:  -Continue heparin  at 1400 units/hr -Daily heparin  level and CBC  Prentice Poisson, PharmD Clinical Pharmacist **Pharmacist  phone directory can now be found on [email or web address] ([email or web address]).  Listed under [facility].

## 2024-03-25 DIAGNOSIS — I4819 Other persistent atrial fibrillation: Secondary | ICD-10-CM | POA: Diagnosis not present

## 2024-03-25 DIAGNOSIS — I4891 Unspecified atrial fibrillation: Secondary | ICD-10-CM | POA: Diagnosis not present

## 2024-03-25 DIAGNOSIS — I4892 Unspecified atrial flutter: Secondary | ICD-10-CM | POA: Diagnosis not present

## 2024-03-25 DIAGNOSIS — N186 End stage renal disease: Secondary | ICD-10-CM | POA: Diagnosis not present

## 2024-03-25 DIAGNOSIS — Z9889 Other specified postprocedural states: Secondary | ICD-10-CM | POA: Diagnosis not present

## 2024-03-25 DIAGNOSIS — I4811 Longstanding persistent atrial fibrillation: Secondary | ICD-10-CM | POA: Diagnosis not present

## 2024-03-25 DIAGNOSIS — R9431 Abnormal electrocardiogram [ECG] [EKG]: Secondary | ICD-10-CM | POA: Diagnosis not present

## 2024-03-25 LAB — APTT: aPTT: 101 s — ABNORMAL HIGH (ref 24–36)

## 2024-03-25 LAB — CBC
HCT: 30.8 % — ABNORMAL LOW (ref 36.0–46.0)
Hemoglobin: 9.9 g/dL — ABNORMAL LOW (ref 12.0–15.0)
MCH: 31.2 pg (ref 26.0–34.0)
MCHC: 32.1 g/dL (ref 30.0–36.0)
MCV: 97.2 fL (ref 80.0–100.0)
Platelets: 125 K/uL — ABNORMAL LOW (ref 150–400)
RBC: 3.17 MIL/uL — ABNORMAL LOW (ref 3.87–5.11)
RDW: 15.5 % (ref 11.5–15.5)
WBC: 6.6 K/uL (ref 4.0–10.5)
nRBC: 0 % (ref 0.0–0.2)

## 2024-03-25 LAB — GLUCOSE, CAPILLARY
Glucose-Capillary: 118 mg/dL — ABNORMAL HIGH (ref 70–99)
Glucose-Capillary: 109 mg/dL — ABNORMAL HIGH (ref 70–99)
Glucose-Capillary: 151 mg/dL — ABNORMAL HIGH (ref 70–99)
Glucose-Capillary: 152 mg/dL — ABNORMAL HIGH (ref 70–99)

## 2024-03-25 LAB — BLOOD GAS, VENOUS
Acid-Base Excess: 7 mmol/L — ABNORMAL HIGH (ref 0.0–2.0)
Bicarbonate: 34.5 mmol/L — ABNORMAL HIGH (ref 20.0–28.0)
Drawn by: 8344
O2 Saturation: 31.9 %
Patient temperature: 36.6
pCO2, Ven: 60 mmHg (ref 44–60)
pH, Ven: 7.37 (ref 7.25–7.43)
pO2, Ven: <31 mmHg — CL (ref 32–45)

## 2024-03-25 LAB — HEPARIN LEVEL (UNFRACTIONATED): Heparin Unfractionated: >1.1 [IU]/mL — ABNORMAL HIGH (ref 0.30–0.70)

## 2024-03-25 MED ORDER — DIPHENHYDRAMINE HCL 50 MG/ML IJ SOLN
25.0000 mg | Freq: Four times a day (QID) | INTRAMUSCULAR | Status: DC | PRN
  Administered 2024-03-25: 25 mg via INTRAVENOUS
  Filled 2024-03-25: qty 1

## 2024-03-25 MED ORDER — HYDROXYZINE HCL 10 MG PO TABS
10.0000 mg | ORAL_TABLET | Freq: Once | ORAL | Status: AC
  Administered 2024-03-25: 10 mg via ORAL
  Filled 2024-03-25: qty 1

## 2024-03-25 MED ORDER — DIPHENHYDRAMINE HCL 50 MG/ML IJ SOLN
INTRAMUSCULAR | Status: AC
  Filled 2024-03-25: qty 1

## 2024-03-25 MED ORDER — MIDODRINE HCL 5 MG PO TABS
ORAL_TABLET | ORAL | Status: AC
  Filled 2024-03-25: qty 2

## 2024-03-25 MED ORDER — DIPHENHYDRAMINE HCL 50 MG/ML IJ SOLN
25.0000 mg | INTRAMUSCULAR | Status: DC | PRN
  Administered 2024-03-25: 25 mg via INTRAVENOUS

## 2024-03-25 NOTE — Plan of Care (Signed)
  Problem: Coping: Goal: Ability to adjust to condition or change in health will improve Outcome: Progressing   Problem: Health Behavior/Discharge Planning: Goal: Ability to manage health-related needs will improve Outcome: Progressing   Problem: Metabolic: Goal: Ability to maintain appropriate glucose levels will improve Outcome: Progressing   Problem: Clinical Measurements: Goal: Cardiovascular complication will be avoided Outcome: Progressing   Problem: Safety: Goal: Ability to remain free from injury will improve Outcome: Progressing

## 2024-03-25 NOTE — Progress Notes (Cosign Needed Addendum)
 Patient Name: [name] Date of Encounter: 03/25/2024  Primary Cardiologist: Shelda Bruckner, MD Electrophysiologist: CAMERON T LAMBERT, MD  Interval Summary   The patient is doing well today.  At this time, the patient denies chest pain, shortness of breath, or any new concerns.  Vital Signs    Vitals:   03/25/24 0825 03/25/24 0830 03/25/24 0900 03/25/24 0930  BP: (!) 108/54 (!) 111/54 (!) 102/51 (!) 106/56  Pulse: (!) 56 (!) 56 60 62  Resp: 11 16 17 16   Temp:      TempSrc:      SpO2:      Weight:      Height:        Intake/Output Summary (Last 24 hours) at 03/25/2024 1002 Last data filed at 03/25/2024 0300 Gross per 24 hour  Intake 1849.04 ml  Output --  Net 1849.04 ml   Filed Weights   03/22/24 0943 03/25/24 0810  Weight: 131.5 kg 135 kg    Physical Exam    GEN- NAD, Alert and oriented  Lungs- Clear to ausculation bilaterally, normal work of breathing Cardiac- Regular rate and rhythm, no murmurs, rubs or gallops GI- soft, NT, ND, + BS Extremities- no clubbing or cyanosis. No edema  Telemetry    SB/NSR 50-60s (personally reviewed)  Hospital Course    [name] is a 68 y.o. female with h/o  hypertension, hypertension, hyperlipidemia, diabetes, GERD, CAD, atrial fibrillation, chronic diastolic CHF, anxiety, depression, ESRD on HD, OSA on CPAP, obesity, COPD, QT prolongation, status post mitral valve and tricuspid valve repair, DVT presented with shortness of breath and tachycardia.   Assessment & Plan    Persistent atrial fibrillation/flutter:  Highly symptomatic.  Limits dialysis.  Poorly controlled despite AAD - previously failed Tikosyn  and now on amiodarone  and failed multiple DCCs.  Continue IV amiodarone  for now pending disposition   Not AF ablation candidate   Consider Leadless PPM -> AV nodal ablation.   Timing remains difficulty. We are unlikely to have inpatient availability this week for leadless.  Have offered to  keep her on high dose amiodarone  and schedule as an outpatient. Will look at schedule and come back later this am.   ESRD on HD Volume status managed with dialysis, T/Th/Saturday  For questions or updates, please contact [facility] Please consult [email or web address] for contact info under     Signed, Ozell Prentice Passey, PA-C  03/25/2024, 10:02 AM    I have seen, examined the patient, and reviewed the above assessment and plan.    Interval:  No acute overnight events. Patient reports feeling relatively well. No new or acute complaints.  Leaving for dialysis this morning at time of visit.  Remains in sinus rhythm.  General: Well developed, in no acute distress.  Neck: No JVD.  Cardiac: Bradycardic, regular rhythm.  Resp: Normal work of breathing.  Ext: No edema.  Neuro: No gross focal deficits.  Psych: Normal affect.   Assessment:  #. Persistent atrial fibrillation/flutter: Highly symptomatic. Limiting dialysis. Recurrent with fast rates despite AAD therapy - previously failed Tikosyn  and now on amiodarone . Numerous cardioversions. Will likely recur in future, likely precipitated by fluid shifts in setting of HD. - Continue IV amiodarone  for now. Decreased to 30mg /hr. Will likely transition to oral amiodarone  after procedure. - Not an AF ablation candidate. Best course of action would be leadless pacemaker implant then AV node ablation. Patient would like to see if pacer can be done while admitted. Tentatively Thursday. - Continue heparin   gtt now for anti-coagulation in anticipation of leadless pacer.    #. ESRD on HD - Volume status managed with dialysis.    [name], MD 03/26/2024 9:11 AM

## 2024-03-25 NOTE — Progress Notes (Signed)
 [facility] Progress Note   Subjective:   Patient seen in HD this morning. She had some complaints of nausea this morning and was given Zofran  ODT. She reports some dyspnea, but associates it with heart rate. She states that her daughter came to visit her last night. No reported chest pain this morning. BP stable on Midodrine . Next HD 03/27/24  Objective Vitals:   03/25/24 1000 03/25/24 1030 03/25/24 1100 03/25/24 1130  BP: 103/66 (!) 126/56 109/81 109/68  Pulse: 62 68 67 67  Resp: 20 17 17 19   Temp:      TempSrc:      SpO2:      Weight:      Height:       Exam Gen alert, no distress, on RA No rash, cyanosis or gangrene Sclera anicteric, throat clear  No jvd or bruits Chest clear bilat to bases, no rales/ wheezing RRR no MRG Abd soft ntnd no mass or ascites +bs GU deferred MS no joint effusions or deformity Ext no pitting LE or UE edema, no other edema Neuro is alert, Ox 3 , nf     LUA AVF+ brui  Additional Objective Labs: Basic Metabolic Panel: Recent Labs  Lab 03/22/24 0954 03/22/24 1002 03/23/24 0336  NA 136 137 137  K 3.8 3.9 4.1  CL 94* 96* 95*  CO2 30  --  27  GLUCOSE 185* 190* 150*  BUN 27* 31* 41*  CREATININE 7.45* 7.80* 9.48*  CALCIUM  9.1  --  9.2   Liver Function Tests: Recent Labs  Lab 03/22/24 0954 03/23/24 0336  AST 25 20  ALT 13 11  ALKPHOS 87 78  BILITOT 0.9 0.8  PROT 8.1 6.8  ALBUMIN  3.7 3.1*   Recent Labs  Lab 03/22/24 0954  LIPASE 33   CBC: Recent Labs  Lab 03/22/24 0954 03/22/24 1002 03/23/24 0336 03/24/24 0444 03/25/24 0601  WBC 6.7  --  6.5 6.6 6.6  NEUTROABS 4.6  --   --   --   --   HGB 10.4*   < > 9.5* 9.7* 9.9*  HCT 33.2*   < > 30.4* 30.4* 30.8*  MCV 98.8  --  99.3 98.4 97.2  PLT 116*  --  108* 115* 125*   < > = values in this interval not displayed.    CBG: Recent Labs  Lab 03/24/24 0756 03/24/24 1204 03/24/24 1605 03/24/24 2124 03/25/24 0753  GLUCAP 119* 139* 116* 185* 118*     Medications:  amiodarone  30 mg/hr (03/25/24 1004)   heparin  1,400 Units/hr (03/24/24 1758)    busPIRone   5 mg Oral BID   DULoxetine   60 mg Oral Daily   insulin  aspart  0-6 Units Subcutaneous TID WC   lidocaine   1 patch Transdermal Q24H   metoprolol  tartrate  25 mg Oral BID   midodrine   10 mg Oral BID   pantoprazole   40 mg Oral Daily   rosuvastatin   10 mg Oral QHS   sodium chloride  flush  3 mL Intravenous Q12H    Home bp meds: Metoprolol  25 bid Midodrine  10mg  bid    OP HD: TTS SW 4h  B400  123.8kg  2K   AVF  Heparin  2500 Last full OP HD 7/31, post HD wt 127.5kg Comes off anywhere from 2-5kg over        Assessment/ Plan: Afib/ flutter: w/ borderline hypotension, Longstanding problem with failed multiple cardioversions. She has failed Tikosyn  in the past. HR 110-150. Pt was given IV amio  and cardioverted by cardiology (hx of same). Per cardiology considering leadless PPM then AV nodal ablation. On IV heparin / amio.  Chronic diastolic CHF: s/p tricuspid valve repair and mitral valve repair. Last EF in Feb 25 showed EF 55-60%, indeterminate diastolic function, mildly reduced RV function ESRD: on HD TTS. Last HD Sat x 2 hrs due to tachycardia. Next HD 03/27/24  BP: getting her home metoprolol  and midodrine  as at home.  Volume: CXR clear, exam w/o gross edema, on RA, up 4-5kg at minimum. Max UF w/ HD. Anemia of esrd: 9-12 Hgb, follow. HLD: continue rosuvastatin . DM2: on SSI per primary   Belvie Och, NP 03/25/2024, 11:40 AM  [facility]

## 2024-03-25 NOTE — Progress Notes (Signed)
 Progress Note   Patient: [name] FMW:[id number] DOB: [birth year] DOA: 03/22/2024     0 DOS: the patient was seen and examined on 03/25/2024   Brief hospital course: MIKIAH DURALL is a 68 y.o. female with medical history significant of hypertension, hypertension, hyperlipidemia, diabetes, GERD, CAD, atrial fibrillation, chronic diastolic CHF, anxiety, depression, ESRD on HD, OSA on CPAP, obesity, COPD, QT prolongation, status post mitral valve and tricuspid valve repair, DVT presented with shortness of breath and tachycardia. Admitted to TRH service for persistent Afib, Aflutter. EP consulted.  Assessment and Plan: Atrial fibrillation/flutter with RVR Long history of A-fib/flutter with failed multiple cardioversions. Failed Tikosyn  in the past.   Appreciate EP recommendations and assistance. Continue with amiodarone  and heparin  infusion. HR well controlled.  EP plan for leadless pacemaker implant then AV node ablation.  No schedule for procedure planned yet. She wishes to get it done as inpatient. Will follow EP recommendations.   ESRD on HD HD today. She is on TTS schedule. Nephrology on board for HD needs.  Hypotension BP low but stable. Continue home dose midodrine    Hyperlipidemia Continue rosuvastatin    Diabetes A1C 5. Lantus  on hold. Sugars stable. Accucheks, sliding scale insulin  as per protocol   GERD Continue PPI.   Chronic diastolic CHF Status post tricuspid valve repair and mitral valve repair Last echo in February 2025 with EF 55-60%, indeterminate diastolic function, mildly reduced RV function.  Volume management with hemodialysis Continue home dose metoprolol    Anxiety Depression Continue BuSpar , duloxetine    OSA Continue home CPAP   Morbid obesity BMI 45.42 Diet, exercise and weight reduction advised.   COPD Continue home PRN albuterol    QTc prolongation Initial EKG with QTc of 527.  On repeat EKG QTc improved to 477. Avoid QT  prolonging drugs.   History of DVT Heparin  drip for now, transition to Eliquis  upon discharge.     Out of bed to chair. Incentive spirometry. Nursing supportive care. Fall, aspiration precautions. Diet:  Diet Orders (From admission, onward)     Start     Ordered   03/24/24 0708  Diet Heart Room service appropriate? Yes; Fluid consistency: Thin  Diet effective now       Question Answer Comment  Room service appropriate? Yes   Fluid consistency: Thin      03/24/24 0707           DVT prophylaxis: heparin  drip.  Level of care: Progressive   Code Status: Full Code  Subjective: Patient is seen and examined today morning during HD. She feels nauseous, left lower abdominal pain. Eating fair, had bowel movement 2 days ago.Tolerating amiodarone , heparin  gtt.  Physical Exam: Vitals:   03/25/24 1230 03/25/24 1235 03/25/24 1240 03/25/24 1313  BP: (!) 122/108  (!) 132/115 (!) 114/50  Pulse: 67 67 68   Resp: 19 (!) 22 20 19   Temp:   98.1 F (36.7 C) 97.9 F (36.6 C)  TempSrc:    Oral  SpO2:    98%  Weight:   131 kg   Height:        General - Elderly morbidly obese African American female, distress due to pain. HEENT - PERRLA, EOMI, atraumatic head, non tender sinuses. Lung - Clear, basal rales, rhonchi, wheezes. Heart - S1, S2 heard, no murmurs, rubs, trace pedal edema. Abdomen - Soft, non tender, obese, bowel sounds good Neuro - Alert, awake and oriented x 3, non focal exam. Skin - Warm and dry.  Data Reviewed:  Latest Ref Rng & Units 03/25/2024    6:01 AM 03/24/2024    4:44 AM 03/23/2024    3:36 AM  CBC  WBC 4.0 - 10.5 K/uL 6.6  6.6  6.5   Hemoglobin 12.0 - 15.0 g/dL 9.9  9.7  9.5   Hematocrit 36.0 - 46.0 % 30.8  30.4  30.4   Platelets 150 - 400 K/uL 125  115  108       Latest Ref Rng & Units 03/23/2024    3:36 AM 03/22/2024   10:02 AM 03/22/2024    9:54 AM  BMP  Glucose 70 - 99 mg/dL 849  809  814   BUN 8 - 23 mg/dL 41  31  27   Creatinine 0.44 - 1.00 mg/dL  0.51  2.19  2.54   Sodium 135 - 145 mmol/L 137  137  136   Potassium 3.5 - 5.1 mmol/L 4.1  3.9  3.8   Chloride 98 - 111 mmol/L 95  96  94   CO2 22 - 32 mmol/L 27   30   Calcium  8.9 - 10.3 mg/dL 9.2   9.1    No results found.   Family Communication: Discussed with patient, understand and agree. All questions answered.  Disposition: Status is: Observation The patient remains OBS appropriate and will d/c before 2 midnights. Awaiting leadless pacer placement.  Planned Discharge Destination: Home with Home Health     Time spent: 42 minutes  Author: Concepcion Riser, MD 03/25/2024 1:34 PM Secure chat 7am to 7pm For on call review [email or web address].

## 2024-03-25 NOTE — Progress Notes (Signed)
   03/25/24 1240  Vitals  Temp 98.1 F (36.7 C)  Pulse Rate 68  Resp 20  BP (!) 132/115  O2 Device Room Air  Weight 131 kg  Type of Weight Post-Dialysis  Post Treatment  Dialyzer Clearance Clear  Hemodialysis Intake (mL) 0 mL  Liters Processed 96  Fluid Removed (mL) 4000 mL  Tolerated HD Treatment Yes  AVG/AVF Arterial Site Held (minutes) 8 minutes  AVG/AVF Venous Site Held (minutes) 8 minutes   Received patient in bed to unit.  Alert and oriented.  Informed consent signed and in chart.   TX duration:4hrs  Patient tolerated well.  Transported back to the room  Alert, without acute distress.  Hand-off given to patient's nurse.   Access used: LAVF Access issues: none  Total UF removed: 4L Medication(s) given: midodrine , benadryl     [facility]

## 2024-03-25 NOTE — Progress Notes (Signed)
 Pt receives out-pt HD at [facility] [place] on TTS 6:50 am chair time. Will assist as needed.   Randine Mungo Dialysis Navigator [phone]

## 2024-03-25 NOTE — Progress Notes (Signed)
 PHARMACY - ANTICOAGULATION CONSULT NOTE  Pharmacy Consult for heparin  Indication: atrial fibrillation  Allergies  Allergen Reactions   Bee Pollen Anaphylaxis and Other (See Comments)    UNCONFIRMED, but IS allergic to bee VENOM   Bee Venom Anaphylaxis   Sulfa Antibiotics Itching and Rash   Chlorhexidine      Unknown rxn.   Iodinated Contrast Media Nausea And Vomiting    Patient Measurements: Height: 5' 7 (170.2 cm) Weight: 131 kg (288 lb 12.8 oz) IBW/kg (Calculated) : 61.6 HEPARIN  DW (KG): 93.4  Vital Signs: Temp: 97.9 F (36.6 C) (08/05 1313) Temp Source: Oral (08/05 1313) BP: 113/55 (08/05 1336) Pulse Rate: 66 (08/05 1336)  Labs: Recent Labs    03/23/24 0336 03/24/24 0444 03/24/24 0835 03/25/24 0601 03/25/24 1424  HGB 9.5* 9.7*  --  9.9*  --   HCT 30.4* 30.4*  --  30.8*  --   PLT 108* 115*  --  125*  --   APTT  --   --  101*  --  101*  HEPARINUNFRC  --   --  >1.10*  --  >1.10*  CREATININE 9.48*  --   --   --   --     Estimated Creatinine Clearance: 8.1 mL/min (A) (by C-G formula based on SCr of 9.48 mg/dL (H)).   Medical History: Past Medical History:  Diagnosis Date   Acute on chronic systolic CHF (congestive heart failure) (HCC) 12/21/2020   Allergic rhinitis    Anemia    Arthritis    Asthma    Atrial fibrillation with RVR (HCC) 11/09/2023   Chronic diastolic CHF (congestive heart failure) (HCC)    Chronic kidney disease    Dialysis Tu/TH/Sa   COPD (chronic obstructive pulmonary disease) (HCC)    COVID-19 03/28/2023   Depression    DM (diabetes mellitus) (HCC)    DVT (deep vein thrombosis) in pregnancy    Gastroenteritis 03/27/2023   GERD (gastroesophageal reflux disease)    HCAP (healthcare-associated pneumonia) 06/21/2022   HTN (hypertension)    Hyperlipidemia    Obesity    Pneumonia 04/2018   RIGHT LOBE   Sleep apnea    needs to be retested - to get new cpap    Medications:  Scheduled:   busPIRone   5 mg Oral BID   DULoxetine   60  mg Oral Daily   insulin  aspart  0-6 Units Subcutaneous TID WC   lidocaine   1 patch Transdermal Q24H   metoprolol  tartrate  25 mg Oral BID   midodrine   10 mg Oral BID   pantoprazole   40 mg Oral Daily   rosuvastatin   10 mg Oral QHS   sodium chloride  flush  3 mL Intravenous Q12H    Assessment: 68 yo female with a PMH significant for HTN, HLD, DM, GERD, CAD, Afib, ESRD on HD, and diastolic HF. Patient was cardioverted on 8/2. Pharmacy was consulted to initiate heparin  for atrial fibrillation due to PPM procedure.  Patient was on apixaban , with the last dose being 8/3 AM. -aPTT 101 and at goal -Heparin  level > 1.1 due to recent apixaban    Goal of Therapy:  Heparin  level 0.3-0.7 units/ml aPTT 66-102 seconds Monitor platelets by anticoagulation protocol: Yes   Plan:  -Continue heparin  at 1400 units/hr -Daily heparin  level and CBC  Rocky Slade, PharmD, BCPS Clinical Pharmacist **Pharmacist phone directory can now be found on [email or web address] ([email or web address]).  Listed under [facility].

## 2024-03-26 DIAGNOSIS — I5023 Acute on chronic systolic (congestive) heart failure: Secondary | ICD-10-CM | POA: Diagnosis not present

## 2024-03-26 DIAGNOSIS — N186 End stage renal disease: Secondary | ICD-10-CM | POA: Diagnosis present

## 2024-03-26 DIAGNOSIS — M25551 Pain in right hip: Secondary | ICD-10-CM | POA: Diagnosis not present

## 2024-03-26 DIAGNOSIS — E1169 Type 2 diabetes mellitus with other specified complication: Secondary | ICD-10-CM | POA: Diagnosis present

## 2024-03-26 DIAGNOSIS — Z8616 Personal history of COVID-19: Secondary | ICD-10-CM | POA: Diagnosis not present

## 2024-03-26 DIAGNOSIS — I495 Sick sinus syndrome: Secondary | ICD-10-CM | POA: Diagnosis present

## 2024-03-26 DIAGNOSIS — I2510 Atherosclerotic heart disease of native coronary artery without angina pectoris: Secondary | ICD-10-CM | POA: Diagnosis not present

## 2024-03-26 DIAGNOSIS — I132 Hypertensive heart and chronic kidney disease with heart failure and with stage 5 chronic kidney disease, or end stage renal disease: Secondary | ICD-10-CM | POA: Diagnosis present

## 2024-03-26 DIAGNOSIS — W07XXXA Fall from chair, initial encounter: Secondary | ICD-10-CM | POA: Diagnosis present

## 2024-03-26 DIAGNOSIS — I120 Hypertensive chronic kidney disease with stage 5 chronic kidney disease or end stage renal disease: Secondary | ICD-10-CM | POA: Diagnosis not present

## 2024-03-26 DIAGNOSIS — I4589 Other specified conduction disorders: Secondary | ICD-10-CM | POA: Diagnosis present

## 2024-03-26 DIAGNOSIS — R200 Anesthesia of skin: Secondary | ICD-10-CM | POA: Diagnosis not present

## 2024-03-26 DIAGNOSIS — I4891 Unspecified atrial fibrillation: Secondary | ICD-10-CM | POA: Diagnosis not present

## 2024-03-26 DIAGNOSIS — I4892 Unspecified atrial flutter: Secondary | ICD-10-CM

## 2024-03-26 DIAGNOSIS — I5032 Chronic diastolic (congestive) heart failure: Secondary | ICD-10-CM | POA: Diagnosis present

## 2024-03-26 DIAGNOSIS — R0989 Other specified symptoms and signs involving the circulatory and respiratory systems: Secondary | ICD-10-CM | POA: Diagnosis not present

## 2024-03-26 DIAGNOSIS — I4811 Longstanding persistent atrial fibrillation: Secondary | ICD-10-CM | POA: Diagnosis not present

## 2024-03-26 DIAGNOSIS — D631 Anemia in chronic kidney disease: Secondary | ICD-10-CM | POA: Diagnosis present

## 2024-03-26 DIAGNOSIS — Z992 Dependence on renal dialysis: Secondary | ICD-10-CM | POA: Diagnosis not present

## 2024-03-26 DIAGNOSIS — J4489 Other specified chronic obstructive pulmonary disease: Secondary | ICD-10-CM | POA: Diagnosis present

## 2024-03-26 DIAGNOSIS — Z006 Encounter for examination for normal comparison and control in clinical research program: Secondary | ICD-10-CM | POA: Diagnosis not present

## 2024-03-26 DIAGNOSIS — F32A Depression, unspecified: Secondary | ICD-10-CM | POA: Diagnosis present

## 2024-03-26 DIAGNOSIS — Z794 Long term (current) use of insulin: Secondary | ICD-10-CM | POA: Diagnosis not present

## 2024-03-26 DIAGNOSIS — E8779 Other fluid overload: Secondary | ICD-10-CM | POA: Diagnosis not present

## 2024-03-26 DIAGNOSIS — Z7901 Long term (current) use of anticoagulants: Secondary | ICD-10-CM | POA: Diagnosis not present

## 2024-03-26 DIAGNOSIS — Z950 Presence of cardiac pacemaker: Secondary | ICD-10-CM | POA: Diagnosis not present

## 2024-03-26 DIAGNOSIS — M51369 Other intervertebral disc degeneration, lumbar region without mention of lumbar back pain or lower extremity pain: Secondary | ICD-10-CM | POA: Diagnosis not present

## 2024-03-26 DIAGNOSIS — M1611 Unilateral primary osteoarthritis, right hip: Secondary | ICD-10-CM | POA: Diagnosis not present

## 2024-03-26 DIAGNOSIS — Z043 Encounter for examination and observation following other accident: Secondary | ICD-10-CM | POA: Diagnosis not present

## 2024-03-26 DIAGNOSIS — E1122 Type 2 diabetes mellitus with diabetic chronic kidney disease: Secondary | ICD-10-CM | POA: Diagnosis present

## 2024-03-26 DIAGNOSIS — Z6841 Body Mass Index (BMI) 40.0 and over, adult: Secondary | ICD-10-CM | POA: Diagnosis not present

## 2024-03-26 DIAGNOSIS — S32049A Unspecified fracture of fourth lumbar vertebra, initial encounter for closed fracture: Secondary | ICD-10-CM | POA: Diagnosis present

## 2024-03-26 DIAGNOSIS — I4819 Other persistent atrial fibrillation: Secondary | ICD-10-CM | POA: Diagnosis present

## 2024-03-26 LAB — CBC
HCT: 29.8 % — ABNORMAL LOW (ref 36.0–46.0)
Hemoglobin: 9.6 g/dL — ABNORMAL LOW (ref 12.0–15.0)
MCH: 31.5 pg (ref 26.0–34.0)
MCHC: 32.2 g/dL (ref 30.0–36.0)
MCV: 97.7 fL (ref 80.0–100.0)
Platelets: 126 K/uL — ABNORMAL LOW (ref 150–400)
RBC: 3.05 MIL/uL — ABNORMAL LOW (ref 3.87–5.11)
RDW: 15.5 % (ref 11.5–15.5)
WBC: 5.8 K/uL (ref 4.0–10.5)
nRBC: 0 % (ref 0.0–0.2)

## 2024-03-26 LAB — GLUCOSE, CAPILLARY
Glucose-Capillary: 111 mg/dL — ABNORMAL HIGH (ref 70–99)
Glucose-Capillary: 116 mg/dL — ABNORMAL HIGH (ref 70–99)
Glucose-Capillary: 128 mg/dL — ABNORMAL HIGH (ref 70–99)
Glucose-Capillary: 123 mg/dL — ABNORMAL HIGH (ref 70–99)

## 2024-03-26 LAB — APTT: aPTT: 101 s — ABNORMAL HIGH (ref 24–36)

## 2024-03-26 LAB — BASIC METABOLIC PANEL WITH GFR
Anion gap: 16 — ABNORMAL HIGH (ref 5–15)
BUN: 36 mg/dL — ABNORMAL HIGH (ref 8–23)
CO2: 26 mmol/L (ref 22–32)
Calcium: 9.8 mg/dL (ref 8.9–10.3)
Chloride: 92 mmol/L — ABNORMAL LOW (ref 98–111)
Creatinine, Ser: 8.33 mg/dL — ABNORMAL HIGH (ref 0.44–1.00)
GFR, Estimated: 5 mL/min — ABNORMAL LOW (ref >60)
Glucose, Bld: 112 mg/dL — ABNORMAL HIGH (ref 70–99)
Potassium: 4.2 mmol/L (ref 3.5–5.1)
Sodium: 134 mmol/L — ABNORMAL LOW (ref 135–145)

## 2024-03-26 LAB — HEPARIN LEVEL (UNFRACTIONATED): Heparin Unfractionated: 1 [IU]/mL — ABNORMAL HIGH (ref 0.30–0.70)

## 2024-03-26 LAB — HEPATITIS B SURFACE ANTIBODY, QUANTITATIVE: Hep B S AB Quant (Post): 65.1 m[IU]/mL

## 2024-03-26 MED ORDER — FENTANYL CITRATE PF 50 MCG/ML IJ SOSY
12.5000 ug | PREFILLED_SYRINGE | Freq: Once | INTRAMUSCULAR | Status: AC
  Administered 2024-03-26: 12.5 ug via INTRAVENOUS
  Filled 2024-03-26: qty 1

## 2024-03-26 MED ORDER — LIDOCAINE 5 % EX PTCH
3.0000 | MEDICATED_PATCH | CUTANEOUS | Status: DC
  Administered 2024-03-26 – 2024-03-29 (×4): 3 via TRANSDERMAL
  Filled 2024-03-26 (×4): qty 3

## 2024-03-26 NOTE — Progress Notes (Signed)
 PROGRESS NOTE    [name]  FMW:[id number] DOB: 1956/06/16 DOA: 03/22/2024 PCP: Celestia Rosaline SQUIBB, NP  Chief Complaint  Patient presents with   Shortness of Breath    Brief Narrative:   [name] is Maurice Fotheringham 68 y.o. female with medical history significant of hypertension, hypertension, hyperlipidemia, diabetes, GERD, CAD, atrial fibrillation, chronic diastolic CHF, anxiety, depression, ESRD on HD, OSA on CPAP, obesity, COPD, QT prolongation, status post mitral valve and tricuspid valve repair, DVT presented with shortness of breath and tachycardia. Admitted to TRH service for persistent Afib, Aflutter. EP consulted.   Assessment & Plan:   Active Problems:   ESRD (end stage renal disease) (HCC)   GERD (gastroesophageal reflux disease)   OSA on CPAP   COPD (chronic obstructive pulmonary disease) (HCC)   Class 3 obesity   Type 2 diabetes mellitus with hyperlipidemia (HCC)   S/P mitral valve repair   S/P tricuspid valve repair   Chronic diastolic CHF (congestive heart failure) (HCC)   QT prolongation   Atrial flutter with rapid ventricular response (HCC)   Anxiety and depression   Chronic deep vein thrombosis (DVT) of other vein of right upper extremity (HCC)   HLD (hyperlipidemia)   CAD in native artery   Persistent atrial fibrillation (HCC)   Atrial fibrillation with RVR (HCC)  Atrial Fibrillation Flutter with RVR Per cards, appreciate assistance - failed AADs and DCCS in past despite amio.  Plan is for leadless PPM -> AV nodal ablation.   Metoprolol , amiodarone    ESRD Volume per renal   Hypotension BP appropriate, currently on midodrine  BID (also getting metoprolol )    Chronic diastolic CHF Status post tricuspid valve repair and mitral valve repair Last echo in February 2025 with EF 55-60%, indeterminate diastolic function, mildly reduced RV function.  Volume management with hemodialysis Continue home dose metoprolol    Hyperlipidemia Continue  rosuvastatin    Diabetes A1C 5. Lantus  on hold. Sugars stable. Accucheks, sliding scale insulin  as per protocol   GERD Continue PPI.   Anxiety Depression Continue BuSpar , duloxetine    OSA Continue home CPAP Needs outpatient sleep study   Morbid obesity BMI 45.42   COPD Continue home PRN albuterol    QTc prolongation Initial EKG with QTc of 527.  On repeat EKG QTc improved to 477. Caution with QT prolonging meds   History of DVT Heparin  drip for now, transition to Eliquis  upon discharge.  Obesity, Class IV Body mass index is 45.23 kg/m.    DVT prophylaxis: heparin  gtt Code Status: full Family Communication: none Disposition:   Status is: Observation The patient remains OBS appropriate and will d/c before 2 midnights.   Consultants:  cardiology  Procedures:  none  Antimicrobials:  Anti-infectives (From admission, onward)    None       Subjective: No new complaints  Objective: Vitals:   03/26/24 0422 03/26/24 0850 03/26/24 0900 03/26/24 1152  BP: 91/75 91/75 (!) 148/120 (!) 102/58  Pulse: 62 63    Resp: 16  17 18   Temp: 98.8 F (37.1 C)  98.5 F (36.9 C) 98.9 F (37.2 C)  TempSrc: Oral  Oral Oral  SpO2: 100%  99%   Weight:      Height:       No intake or output data in the 24 hours ending 03/26/24 1240 Filed Weights   03/22/24 0943 03/25/24 0810 03/25/24 1240  Weight: 131.5 kg 135 kg 131 kg    Examination:  General exam: Appears calm and comfortable  Respiratory system:  Clear to auscultation. Respiratory effort normal. Cardiovascular system: RRR Gastrointestinal system: Abdomen is nondistended, soft and nontender.  Central nervous system: Alert and oriented. No focal neurological deficits. Extremities: nonpitting edema    Data Reviewed: I have personally reviewed following labs and imaging studies  CBC: Recent Labs  Lab 03/22/24 0954 03/22/24 1002 03/23/24 0336 03/24/24 0444 03/25/24 0601 03/26/24 0509  WBC 6.7  --   6.5 6.6 6.6 5.8  NEUTROABS 4.6  --   --   --   --   --   HGB 10.4* 11.6* 9.5* 9.7* 9.9* 9.6*  HCT 33.2* 34.0* 30.4* 30.4* 30.8* 29.8*  MCV 98.8  --  99.3 98.4 97.2 97.7  PLT 116*  --  108* 115* 125* 126*    Basic Metabolic Panel: Recent Labs  Lab 03/22/24 0954 03/22/24 1002 03/23/24 0336 03/26/24 0509  NA 136 137 137 134*  K 3.8 3.9 4.1 4.2  CL 94* 96* 95* 92*  CO2 30  --  27 26  GLUCOSE 185* 190* 150* 112*  BUN 27* 31* 41* 36*  CREATININE 7.45* 7.80* 9.48* 8.33*  CALCIUM  9.1  --  9.2 9.8  MG 2.0  --   --   --     GFR: Estimated Creatinine Clearance: 9.2 mL/min (Jozee Hammer) (by C-G formula based on SCr of 8.33 mg/dL (H)).  Liver Function Tests: Recent Labs  Lab 03/22/24 0954 03/23/24 0336  AST 25 20  ALT 13 11  ALKPHOS 87 78  BILITOT 0.9 0.8  PROT 8.1 6.8  ALBUMIN  3.7 3.1*    CBG: Recent Labs  Lab 03/25/24 1305 03/25/24 1713 03/25/24 2101 03/26/24 0832 03/26/24 1156  GLUCAP 109* 151* 152* 111* 116*     No results found for this or any previous visit (from the past 240 hours).       Radiology Studies: No results found.      Scheduled Meds:  busPIRone   5 mg Oral BID   DULoxetine   60 mg Oral Daily   insulin  aspart  0-6 Units Subcutaneous TID WC   lidocaine   3 patch Transdermal Q24H   metoprolol  tartrate  25 mg Oral BID   midodrine   10 mg Oral BID   pantoprazole   40 mg Oral Daily   rosuvastatin   10 mg Oral QHS   sodium chloride  flush  3 mL Intravenous Q12H   Continuous Infusions:  amiodarone  30 mg/hr (03/26/24 1221)   heparin  1,400 Units/hr (03/26/24 0850)     LOS: 0 days    Time spent: over 30 min     Meliton Monte, MD [facility]   To contact the attending provider between 7A-7P or the covering provider during after hours 7P-7A, please log into the web site [email or web address] and access using universal  password for that web site. If you do not have the password, please call the hospital operator.  03/26/2024, 12:40 PM

## 2024-03-26 NOTE — Progress Notes (Addendum)
 Patient Name: [name] Date of Encounter: 03/26/2024  Primary Cardiologist: Shelda Bruckner, MD Electrophysiologist: OLE ONEIDA HOLTS, MD  Interval Summary   NAEO. No new complaints  Vital Signs    Vitals:   03/25/24 2035 03/25/24 2341 03/26/24 0422 03/26/24 0850  BP: 95/73 92/64 91/75  91/75  Pulse: 64 62 62 63  Resp: 18 18 16    Temp: 99.2 F (37.3 C) 98.8 F (37.1 C) 98.8 F (37.1 C)   TempSrc: Oral Oral Oral   SpO2: 97% 100% 100%   Weight:      Height:        Intake/Output Summary (Last 24 hours) at 03/26/2024 0908 Last data filed at 03/25/2024 1240 Gross per 24 hour  Intake --  Output 4000 ml  Net -4000 ml   Filed Weights   03/22/24 0943 03/25/24 0810 03/25/24 1240  Weight: 131.5 kg 135 kg 131 kg    Physical Exam    GEN- NAD, Alert and oriented  Lungs- Clear to ausculation bilaterally, normal work of breathing Cardiac- Regular rate and rhythm, no murmurs, rubs or gallops GI- soft, NT, ND, + BS Extremities- no clubbing or cyanosis. No edema  Telemetry    SB/NSR 50-60s (personally reviewed)  Hospital Course    [name] is a 68 y.o. female with h/o hypertension, hypertension, hyperlipidemia, diabetes, GERD, CAD, atrial fibrillation, chronic diastolic CHF, anxiety, depression, ESRD on HD, OSA on CPAP, obesity, COPD, QT prolongation, status post mitral valve and tricuspid valve repair, DVT presented with shortness of breath and tachycardia.   Assessment & Plan    Persistent atrial fibrillation/flutter:  Highly symptomatic.  Limits dialysis.  Maintaining NSR currently, but has failed multiple AADs and DCCS in the past despite amiodarone .    Not AF ablation candidate   We have availability tomorrow for leadless and have tentatively placed her on the schedule. Explained risks, benefits, and alternatives to PPM implantation, including but not limited to bleeding, infection, pneumothorax, pericardial effusion, lead  dislodgement, heart attack, stroke, or death.  Pt verbalized understanding and agrees to proceed.      Hold heparin  at midnight.   Consider Leadless PPM -> AV nodal ablation.    ESRD on HD Volume status managed with dialysis, T/Th/Saturday  For questions or updates, please contact [facility] Please consult [email or web address] for contact info under     Signed, Ozell Prentice Passey, PA-C  03/26/2024, 9:08 AM    I have seen, examined the patient, and reviewed the above assessment and plan.    Interval:  No acute overnight events. Patient reports feeling relatively well. No new or acute complaints.   Still in sinus rhythm.   General: Well developed, in no acute distress.  Neck: No JVD.  Cardiac: Bradycardic, regular rhythm.  Resp: Normal work of breathing.  Ext: No edema.  Neuro: No gross focal deficits.  Psych: Normal affect.   Echo 10/15/23:   1. 3D EF 69%. Left ventricular ejection fraction, by estimation, is 55 to  60%. The left ventricle has normal function. The left ventricle has no  regional wall motion abnormalities. Left ventricular diastolic parameters  are indeterminate.   2. Right ventricular systolic function is mildly reduced. The right  ventricular size is mildly enlarged.   3. Left atrial size was severely dilated.   4. Right atrial size was severely dilated.   5. Post repair with ring Mild residual MR Mean diastolic gradient 8 mmHg  at HR 68 bpm. The mitral valve has been repaired/replaced.  No evidence of  mitral valve regurgitation. No evidence of mitral stenosis.   6. Post repair with ring. The tricuspid valve is has been  repaired/replaced. Tricuspid valve regurgitation is moderate.   7. The aortic valve is tricuspid. Aortic valve regurgitation is not  visualized. No aortic stenosis is present.   8. The inferior vena cava is normal in size with greater than 50%  respiratory variability, suggesting right atrial pressure of 3 mmHg.   Assessment and  Plan:  #. Persistent atrial fibrillation/flutter: Highly symptomatic. Limiting dialysis. Recurrent with fast rates despite AAD therapy - previously failed Tikosyn  and now on amiodarone . Numerous cardioversions. Will likely recur in future, likely precipitated by fluid shifts in setting of HD. Not an AF ablation candidate. Best course of action would be leadless pacemaker implant then AV node ablation when she recurs.  - Continue IV amiodarone  for now. Decreased to 30mg /hr. Will likely transition to oral amiodarone  after procedure. - Leadless pacemaker tomorrow.  - Heparin  gtt off at 0600 tomorrow.     #. ESRD on HD - Volume status managed with dialysis.    [name], MD 03/26/2024 12:05 PM

## 2024-03-26 NOTE — Progress Notes (Signed)
 PHARMACY - ANTICOAGULATION CONSULT NOTE  Pharmacy Consult for heparin  Indication: atrial fibrillation  Allergies  Allergen Reactions   Bee Pollen Anaphylaxis and Other (See Comments)    UNCONFIRMED, but IS allergic to bee VENOM   Bee Venom Anaphylaxis   Sulfa Antibiotics Itching and Rash   Chlorhexidine      Unknown rxn.   Iodinated Contrast Media Nausea And Vomiting    Patient Measurements: Height: 5' 7 (170.2 cm) Weight: 131 kg (288 lb 12.8 oz) IBW/kg (Calculated) : 61.6 HEPARIN  DW (KG): 93.4  Vital Signs: Temp: 98.8 F (37.1 C) (08/06 0422) Temp Source: Oral (08/06 0422) BP: 148/120 (08/06 0900) Pulse Rate: 63 (08/06 0850)  Labs: Recent Labs    03/24/24 0444 03/24/24 0835 03/25/24 0601 03/25/24 1424 03/26/24 0509  HGB 9.7*  --  9.9*  --  9.6*  HCT 30.4*  --  30.8*  --  29.8*  PLT 115*  --  125*  --  126*  APTT  --  101*  --  101* 101*  HEPARINUNFRC  --  >1.10*  --  >1.10* 1.00*  CREATININE  --   --   --   --  8.33*    Estimated Creatinine Clearance: 9.2 mL/min (A) (by C-G formula based on SCr of 8.33 mg/dL (H)).   Medical History: Past Medical History:  Diagnosis Date   Acute on chronic systolic CHF (congestive heart failure) (HCC) 12/21/2020   Allergic rhinitis    Anemia    Arthritis    Asthma    Atrial fibrillation with RVR (HCC) 11/09/2023   Chronic diastolic CHF (congestive heart failure) (HCC)    Chronic kidney disease    Dialysis Tu/TH/Sa   COPD (chronic obstructive pulmonary disease) (HCC)    COVID-19 03/28/2023   Depression    DM (diabetes mellitus) (HCC)    DVT (deep vein thrombosis) in pregnancy    Gastroenteritis 03/27/2023   GERD (gastroesophageal reflux disease)    HCAP (healthcare-associated pneumonia) 06/21/2022   HTN (hypertension)    Hyperlipidemia    Obesity    Pneumonia 04/2018   RIGHT LOBE   Sleep apnea    needs to be retested - to get new cpap    Medications:  Scheduled:   busPIRone   5 mg Oral BID   DULoxetine   60  mg Oral Daily   insulin  aspart  0-6 Units Subcutaneous TID WC   lidocaine   1 patch Transdermal Q24H   metoprolol  tartrate  25 mg Oral BID   midodrine   10 mg Oral BID   pantoprazole   40 mg Oral Daily   rosuvastatin   10 mg Oral QHS   sodium chloride  flush  3 mL Intravenous Q12H    Assessment: 68 yo female with a PMH significant for HTN, HLD, DM, GERD, CAD, Afib, ESRD on HD, and diastolic HF. Patient was cardioverted on 8/2. Pharmacy was consulted to initiate heparin  for atrial fibrillation due to PPM procedure.  Patient was on apixaban , with the last dose being 8/3 AM. -aPTT 101 and at goal on heparin  at 1400 units/hr -Heparin  level 1.1 due to recent apixaban  -plans noted for PPM 8/7  Goal of Therapy:  Heparin  level 0.3-0.7 units/ml aPTT 66-102 seconds Monitor platelets by anticoagulation protocol: Yes   Plan:  -Continue heparin  at 1400 units/hr -Stop heparin  at midnight for PPM on 8/7  Rocky Slade, PharmD, BCPS Clinical Pharmacist **Pharmacist phone directory can now be found on [email or web address] ([email or web address]).  Listed under [facility].

## 2024-03-26 NOTE — Progress Notes (Signed)
   03/26/24 2259  BiPAP/CPAP/SIPAP  BiPAP/CPAP/SIPAP Pt Type Adult  BiPAP/CPAP/SIPAP DREAMSTATIOND  Mask Type Full face mask  Dentures removed? Not applicable  Mask Size Medium  EPAP 8 cmH2O  FiO2 (%) 21 %  Patient Home Machine No  Patient Home Mask No  Patient Home Tubing No  Auto Titrate No  CPAP/SIPAP surface wiped down Yes  Device Plugged into RED Power Outlet Yes  BiPAP/CPAP /SiPAP Vitals  Pulse Rate (!) 58  SpO2 100 %  MEWS Score/Color  MEWS Score 0  MEWS Score Color Landy

## 2024-03-26 NOTE — Progress Notes (Signed)
 [facility] Progress Note   Subjective:   Patient seen in her room this morning. Planned pacemaker surgery on 03/27/24. She reports that she was able to get more rest last night. She states that HD went well and had no complaints. Still on cardizem  gtt. No reported dyspnea or CP. Next HD 03/27/24  Objective Vitals:   03/26/24 0422 03/26/24 0850 03/26/24 0900 03/26/24 1152  BP: 91/75 91/75 (!) 148/120 (!) 102/58  Pulse: 62 63    Resp: 16  17 18   Temp: 98.8 F (37.1 C)  98.5 F (36.9 C) 98.9 F (37.2 C)  TempSrc: Oral  Oral Oral  SpO2: 100%  99%   Weight:      Height:       Exam Gen alert, no distress, on RA No rash, cyanosis or gangrene Sclera anicteric, throat clear  No jvd or bruits Chest clear bilat to bases, no rales/ wheezing RRR no MRG Abd soft ntnd no mass or ascites +bs GU deferred MS no joint effusions or deformity Ext no pitting LE or UE edema, no other edema Neuro is alert, Ox 3 , nf     LUA AVF+ brui  Additional Objective Labs: Basic Metabolic Panel: Recent Labs  Lab 03/22/24 0954 03/22/24 1002 03/23/24 0336 03/26/24 0509  NA 136 137 137 134*  K 3.8 3.9 4.1 4.2  CL 94* 96* 95* 92*  CO2 30  --  27 26  GLUCOSE 185* 190* 150* 112*  BUN 27* 31* 41* 36*  CREATININE 7.45* 7.80* 9.48* 8.33*  CALCIUM  9.1  --  9.2 9.8   Liver Function Tests: Recent Labs  Lab 03/22/24 0954 03/23/24 0336  AST 25 20  ALT 13 11  ALKPHOS 87 78  BILITOT 0.9 0.8  PROT 8.1 6.8  ALBUMIN  3.7 3.1*   Recent Labs  Lab 03/22/24 0954  LIPASE 33   CBC: Recent Labs  Lab 03/22/24 0954 03/22/24 1002 03/23/24 0336 03/24/24 0444 03/25/24 0601 03/26/24 0509  WBC 6.7  --  6.5 6.6 6.6 5.8  NEUTROABS 4.6  --   --   --   --   --   HGB 10.4*   < > 9.5* 9.7* 9.9* 9.6*  HCT 33.2*   < > 30.4* 30.4* 30.8* 29.8*  MCV 98.8  --  99.3 98.4 97.2 97.7  PLT 116*  --  108* 115* 125* 126*   < > = values in this interval not displayed.    CBG: Recent Labs  Lab  03/25/24 1305 03/25/24 1713 03/25/24 2101 03/26/24 0832 03/26/24 1156  GLUCAP 109* 151* 152* 111* 116*    Medications:  amiodarone  30 mg/hr (03/26/24 1221)   heparin  1,400 Units/hr (03/26/24 0850)    busPIRone   5 mg Oral BID   DULoxetine   60 mg Oral Daily   insulin  aspart  0-6 Units Subcutaneous TID WC   lidocaine   3 patch Transdermal Q24H   metoprolol  tartrate  25 mg Oral BID   midodrine   10 mg Oral BID   pantoprazole   40 mg Oral Daily   rosuvastatin   10 mg Oral QHS   sodium chloride  flush  3 mL Intravenous Q12H    Home bp meds: Metoprolol  25 bid Midodrine  10mg  bid    OP HD: TTS SW 4h  B400  123.8kg  2K   AVF  Heparin  2500 Last full OP HD 7/31, post HD wt 127.5kg Comes off anywhere from 2-5kg over        Assessment/ Plan: Afib/ flutter:  w/ borderline hypotension, Longstanding problem with failed multiple cardioversions. She has failed Tikosyn  in the past. HR 110-150. Pt was given IV amio and cardioverted by cardiology (hx of same). Per cardiology planning leadless PPM then AV nodal ablation on 03/27/24. On IV heparin / dilt.  Chronic diastolic CHF: s/p tricuspid valve repair and mitral valve repair. Last EF in Feb 25 showed EF 55-60%, indeterminate diastolic function, mildly reduced RV function ESRD: on HD TTS. Last HD Sat x 2 hrs due to tachycardia. Next HD 03/27/24  BP: getting her home metoprolol  and midodrine  as at home.  Volume: CXR clear, exam w/o gross edema, on RA, up 4-5kg at minimum. Max UF w/ HD. Anemia of esrd: 9-12 Hgb, follow. HLD: continue rosuvastatin . DM2: on SSI per primary   Belvie Och, NP 03/26/2024, 1:27 PM  [facility]

## 2024-03-26 NOTE — TOC Progression Note (Signed)
 Transition of Care [facility]) - Progression Note    Patient Details  Name: [name] MRN: [id number] Date of Birth: [birth year]  Transition of Care [facility]) CM/SW Contact  Graves-Bigelow, Erminio Deems, RN Phone Number: 03/26/2024, 11:59 AM  Clinical Narrative: Patient was discussed in progression rounds. Case Manager spoke with MD regarding CPAP/BIPAP needs; patient is aware that she will need to follow up with cardiologist and or PCP for further work up. Patient will need RN/PT/OT orders once stable to transition home. DME shower chair has been delivered to the room via [employer]. No further needs identified.     Expected Discharge Plan: Home w Home Health Services Barriers to Discharge: Continued Medical Work up               Expected Discharge Plan and Services   Discharge Planning Services: CM Consult Post Acute Care Choice: Home Health, Resumption of Svcs/PTA Provider Living arrangements for the past 2 months: Apartment                 DME Arranged: Shower stool DME Agency: [employer]       HH Arranged: RN, Disease Management, OT, PT HH Agency: [name] Date [facility] Agency Contacted: 03/24/24 Time HH Agency Contacted: 1331 Representative spoke with at [facility] Agency: [place]   Social Drivers of Health (SDOH) Interventions SDOH Screenings   Food Insecurity: No Food Insecurity (03/22/2024)  Housing: Low Risk  (03/22/2024)  Transportation Needs: No Transportation Needs (03/22/2024)  Utilities: Not At Risk (03/22/2024)  Alcohol  Screen: Low Risk  (06/04/2023)  Depression (PHQ2-9): Low Risk  (03/06/2024)  Financial Resource Strain: Low Risk  (11/19/2023)  Physical Activity: Insufficiently Active (06/04/2023)  Social Connections: Moderately Isolated (03/22/2024)  Stress: No Stress Concern Present (06/04/2023)  Tobacco Use: Low Risk  (03/22/2024)  Health Literacy: Patient Declined (06/04/2023)    Readmission Risk Interventions    03/05/2024   12:22 PM  11/12/2023    4:32 PM 03/16/2023    3:57 PM  Readmission Risk Prevention Plan  Transportation Screening Complete Complete Complete  PCP or Specialist Appt within 3-5 Days Complete    HRI or Home Care Consult Complete Complete   Social Work Consult for Recovery Care Planning/Counseling Complete Complete   Palliative Care Screening Not Applicable Not Applicable   Medication Review [employer]) Referral to Pharmacy Referral to Pharmacy Referral to Pharmacy  PCP or Specialist appointment within 3-5 days of discharge   Complete  HRI or Home Care Consult   Complete  SW Recovery Care/Counseling Consult   Complete  Palliative Care Screening   Not Applicable  Skilled Nursing Facility   Not Applicable

## 2024-03-26 NOTE — Plan of Care (Signed)
  Problem: Education: Goal: Ability to describe self-care measures that may prevent or decrease complications (Diabetes Survival Skills Education) will improve Outcome: Progressing   Problem: Coping: Goal: Ability to adjust to condition or change in health will improve Outcome: Progressing   Problem: Health Behavior/Discharge Planning: Goal: Ability to manage health-related needs will improve Outcome: Progressing   Problem: Skin Integrity: Goal: Risk for impaired skin integrity will decrease Outcome: Progressing   Problem: Nutritional: Goal: Progress toward achieving an optimal weight will improve Outcome: Progressing   Problem: Nutritional: Goal: Progress toward achieving an optimal weight will improve Outcome: Progressing   Problem: Education: Goal: Knowledge of General Education information will improve Description: Including pain rating scale, medication(s)/side effects and non-pharmacologic comfort measures Outcome: Progressing   Problem: Education: Goal: Knowledge of General Education information will improve Description: Including pain rating scale, medication(s)/side effects and non-pharmacologic comfort measures Outcome: Progressing   Problem: Clinical Measurements: Goal: Cardiovascular complication will be avoided Outcome: Progressing

## 2024-03-26 NOTE — Progress Notes (Signed)
 Mobility Specialist Progress Note;   03/26/24 1057  Mobility  Activity Ambulated with assistance  Level of Assistance Contact guard assist, steadying assist  Assistive Device Front wheel walker  Distance Ambulated (ft) 10 ft  Activity Response Tolerated well  Mobility Referral Yes  Mobility visit 1 Mobility  Mobility Specialist Start Time (ACUTE ONLY) 1057  Mobility Specialist Stop Time (ACUTE ONLY) 1120  Mobility Specialist Time Calculation (min) (ACUTE ONLY) 23 min   Pt agreeable to in room mobility. Requested to use BR, void successful. Required MinG assistance throughout ambulation for safety. Some dyspnea displayed once returned to bed. VSS throughout. Pt left comfortably in bed with all needs met, alarm on. RN present.   Lauraine Erm Mobility Specialist Please contact via SecureChat or [employer] [phone]

## 2024-03-27 ENCOUNTER — Inpatient Hospital Stay (HOSPITAL_COMMUNITY)
Admission: EM | Disposition: A | Discharge status: Home / Self Care | Payer: Self-pay | Source: Home / Self Care | Attending: Family Medicine

## 2024-03-27 ENCOUNTER — Inpatient Hospital Stay (HOSPITAL_COMMUNITY)

## 2024-03-27 DIAGNOSIS — I5023 Acute on chronic systolic (congestive) heart failure: Secondary | ICD-10-CM

## 2024-03-27 DIAGNOSIS — N186 End stage renal disease: Secondary | ICD-10-CM

## 2024-03-27 DIAGNOSIS — Z006 Encounter for examination for normal comparison and control in clinical research program: Secondary | ICD-10-CM | POA: Diagnosis not present

## 2024-03-27 DIAGNOSIS — I132 Hypertensive heart and chronic kidney disease with heart failure and with stage 5 chronic kidney disease, or end stage renal disease: Secondary | ICD-10-CM | POA: Diagnosis not present

## 2024-03-27 DIAGNOSIS — I4811 Longstanding persistent atrial fibrillation: Secondary | ICD-10-CM | POA: Diagnosis not present

## 2024-03-27 DIAGNOSIS — I4892 Unspecified atrial flutter: Secondary | ICD-10-CM | POA: Diagnosis not present

## 2024-03-27 HISTORY — PX: PACEMAKER LEADLESS INSERTION: EP1219

## 2024-03-27 LAB — CBC
HCT: 31 % — ABNORMAL LOW (ref 36.0–46.0)
Hemoglobin: 10 g/dL — ABNORMAL LOW (ref 12.0–15.0)
MCH: 31.2 pg (ref 26.0–34.0)
MCHC: 32.3 g/dL (ref 30.0–36.0)
MCV: 96.6 fL (ref 80.0–100.0)
Platelets: 149 K/uL — ABNORMAL LOW (ref 150–400)
RBC: 3.21 MIL/uL — ABNORMAL LOW (ref 3.87–5.11)
RDW: 15.2 % (ref 11.5–15.5)
WBC: 5.8 K/uL (ref 4.0–10.5)
nRBC: 0 % (ref 0.0–0.2)

## 2024-03-27 LAB — BASIC METABOLIC PANEL WITH GFR
Anion gap: 16 — ABNORMAL HIGH (ref 5–15)
BUN: 49 mg/dL — ABNORMAL HIGH (ref 8–23)
CO2: 25 mmol/L (ref 22–32)
Calcium: 9.8 mg/dL (ref 8.9–10.3)
Chloride: 92 mmol/L — ABNORMAL LOW (ref 98–111)
Creatinine, Ser: 10.23 mg/dL — ABNORMAL HIGH (ref 0.44–1.00)
GFR, Estimated: 4 mL/min — ABNORMAL LOW (ref >60)
Glucose, Bld: 109 mg/dL — ABNORMAL HIGH (ref 70–99)
Potassium: 4.4 mmol/L (ref 3.5–5.1)
Sodium: 133 mmol/L — ABNORMAL LOW (ref 135–145)

## 2024-03-27 LAB — GLUCOSE, CAPILLARY
Glucose-Capillary: 89 mg/dL (ref 70–99)
Glucose-Capillary: 160 mg/dL — ABNORMAL HIGH (ref 70–99)
Glucose-Capillary: 125 mg/dL — ABNORMAL HIGH (ref 70–99)
Glucose-Capillary: 188 mg/dL — ABNORMAL HIGH (ref 70–99)

## 2024-03-27 SURGERY — PACEMAKER LEADLESS INSERTION
Anesthesia: General

## 2024-03-27 MED ORDER — DIPHENHYDRAMINE HCL 50 MG/ML IJ SOLN
INTRAMUSCULAR | Status: AC
  Filled 2024-03-27: qty 1

## 2024-03-27 MED ORDER — SODIUM CHLORIDE 0.9% FLUSH: 3.0000 mL | INTRAVENOUS | Status: DC | PRN

## 2024-03-27 MED ORDER — SODIUM CHLORIDE 0.9 % IV SOLN: 250.0000 mL | INTRAVENOUS | Status: AC | PRN

## 2024-03-27 MED ORDER — SUGAMMADEX SODIUM 200 MG/2ML IV SOLN
INTRAVENOUS | Status: DC | PRN
  Administered 2024-03-27: 100 mg via INTRAVENOUS

## 2024-03-27 MED ORDER — ONDANSETRON HCL 4 MG/2ML IJ SOLN
INTRAMUSCULAR | Status: DC | PRN
  Administered 2024-03-27: 4 mg via INTRAVENOUS

## 2024-03-27 MED ORDER — FENTANYL CITRATE (PF) 100 MCG/2ML IJ SOLN
INTRAMUSCULAR | Status: AC
  Filled 2024-03-27: qty 2

## 2024-03-27 MED ORDER — PROPOFOL 10 MG/ML IV BOLUS
INTRAVENOUS | Status: DC | PRN
  Administered 2024-03-27: 100 mg via INTRAVENOUS

## 2024-03-27 MED ORDER — HEPARIN (PORCINE) IN NACL 1000-0.9 UT/500ML-% IV SOLN
INTRAVENOUS | Status: DC | PRN
  Administered 2024-03-27 (×3): 500 mL

## 2024-03-27 MED ORDER — CEFAZOLIN SODIUM-DEXTROSE 3-4 GM/150ML-% IV SOLN
3.0000 g | INTRAVENOUS | Status: AC
  Administered 2024-03-27: 3 g via INTRAVENOUS
  Filled 2024-03-27: qty 150

## 2024-03-27 MED ORDER — SODIUM CHLORIDE 0.9 % IV SOLN: 250.0000 mL | INTRAVENOUS | Status: DC | PRN

## 2024-03-27 MED ORDER — MIDAZOLAM HCL 2 MG/2ML IJ SOLN
INTRAMUSCULAR | Status: AC
  Filled 2024-03-27: qty 2

## 2024-03-27 MED ORDER — SODIUM CHLORIDE 0.9% FLUSH
3.0000 mL | Freq: Two times a day (BID) | INTRAVENOUS | Status: DC
  Administered 2024-03-27 (×2): 3 mL via INTRAVENOUS

## 2024-03-27 MED ORDER — SODIUM CHLORIDE 0.9% FLUSH
3.0000 mL | Freq: Two times a day (BID) | INTRAVENOUS | Status: DC
  Administered 2024-03-27 – 2024-03-28 (×3): 3 mL via INTRAVENOUS

## 2024-03-27 MED ORDER — CEFAZOLIN SODIUM-DEXTROSE 3-4 GM/150ML-% IV SOLN
3.0000 g | INTRAVENOUS | Status: DC
  Filled 2024-03-27: qty 150

## 2024-03-27 MED ORDER — SODIUM CHLORIDE 0.9% FLUSH
3.0000 mL | INTRAVENOUS | Status: DC | PRN
Start: 2024-03-27 — End: 2024-03-29

## 2024-03-27 MED ORDER — PHENYLEPHRINE HCL (PRESSORS) 10 MG/ML IV SOLN
INTRAVENOUS | Status: DC | PRN
  Administered 2024-03-27: 160 ug via INTRAVENOUS

## 2024-03-27 MED ORDER — OXYCODONE HCL 5 MG PO TABS
5.0000 mg | ORAL_TABLET | Freq: Four times a day (QID) | ORAL | Status: DC | PRN
  Administered 2024-03-27 – 2024-03-29 (×4): 5 mg via ORAL
  Filled 2024-03-27 (×4): qty 1

## 2024-03-27 MED ORDER — DIPHENHYDRAMINE HCL 50 MG/ML IJ SOLN
50.0000 mg | Freq: Once | INTRAMUSCULAR | Status: AC
  Administered 2024-03-27: 50 mg via INTRAVENOUS

## 2024-03-27 MED ORDER — IOHEXOL 350 MG/ML SOLN
INTRAVENOUS | Status: DC | PRN
  Administered 2024-03-27: 20 mL

## 2024-03-27 MED ORDER — HEPARIN SODIUM (PORCINE) 1000 UNIT/ML IJ SOLN
INTRAMUSCULAR | Status: DC | PRN
Start: 2024-03-27 — End: 2024-03-27
  Administered 2024-03-27: 5000 [IU] via INTRAVENOUS

## 2024-03-27 MED ORDER — VASOPRESSIN 20 UNIT/ML IV SOLN
INTRAVENOUS | Status: DC | PRN
  Administered 2024-03-27 (×5): 1 [IU] via INTRAVENOUS

## 2024-03-27 MED ORDER — VASOPRESSIN 20 UNITS/100 ML INFUSION FOR SHOCK
0.0000 [IU]/min | INTRAVENOUS | Status: DC
  Administered 2024-03-27: .03 [IU]/h via INTRAVENOUS

## 2024-03-27 MED ORDER — HEPARIN (PORCINE) 25000 UT/250ML-% IV SOLN
1300.0000 [IU]/h | INTRAVENOUS | Status: DC
  Administered 2024-03-27: 1300 [IU]/h via INTRAVENOUS
  Filled 2024-03-27 (×2): qty 250

## 2024-03-27 MED ORDER — DEXAMETHASONE SODIUM PHOSPHATE 10 MG/ML IJ SOLN
INTRAMUSCULAR | Status: DC | PRN
Start: 2024-03-27 — End: 2024-03-27
  Administered 2024-03-27: 10 mg via INTRAVENOUS

## 2024-03-27 MED ORDER — PHENYLEPHRINE HCL-NACL 20-0.9 MG/250ML-% IV SOLN
INTRAVENOUS | Status: DC | PRN
  Administered 2024-03-27: 50 ug/min via INTRAVENOUS

## 2024-03-27 MED ORDER — ALBUMIN HUMAN 5 % IV SOLN: INTRAVENOUS | Status: DC | PRN

## 2024-03-27 MED ORDER — ROCURONIUM BROMIDE 10 MG/ML (PF) SYRINGE
PREFILLED_SYRINGE | INTRAVENOUS | Status: DC | PRN
  Administered 2024-03-27: 50 mg via INTRAVENOUS

## 2024-03-27 MED ORDER — ONDANSETRON HCL 4 MG/2ML IJ SOLN
INTRAMUSCULAR | Status: AC
  Filled 2024-03-27: qty 2

## 2024-03-27 MED ORDER — FENTANYL CITRATE (PF) 250 MCG/5ML IJ SOLN
INTRAMUSCULAR | Status: DC | PRN
  Administered 2024-03-27: 50 ug via INTRAVENOUS

## 2024-03-27 MED ORDER — LIDOCAINE 2% (20 MG/ML) 5 ML SYRINGE
INTRAMUSCULAR | Status: DC | PRN
  Administered 2024-03-27: 100 mg via INTRAVENOUS

## 2024-03-27 SURGICAL SUPPLY — 11 items
CABLE SURGICAL S-101-97-12 (CABLE) ×1 IMPLANT
CLOSURE PERCLOSE PROSTYLE (VASCULAR PRODUCTS) IMPLANT
ELECT DEFIB PAD ADLT CADENCE (PAD) IMPLANT
PACEMAKER LEADLESS AV2 MICRA (Pacemaker) IMPLANT
PAD DEFIB RADIO PHYSIO CONN (PAD) ×1 IMPLANT
SHEATH DILAT COONS TAPER 22F (SHEATH) IMPLANT
SHEATH INTRODUCER MICRA (SHEATH) IMPLANT
SHEATH PINNACLE 8F 10CM (SHEATH) IMPLANT
SHEATH PROBE COVER 6X72 (BAG) IMPLANT
TRAY PACEMAKER INSERTION (PACKS) ×1 IMPLANT
WIRE AMPLATZ SS-J .035X180CM (WIRE) IMPLANT

## 2024-03-27 NOTE — Progress Notes (Signed)
 [facility] Progress Note   Subjective:   Patient seen in HD this morning. She reports that she is very nervous about her PM procedure later today. She expressed that heart disease runs heavily in her family. She tolerated HD well - BP did a bit soft at the end of tx. We were able to pull 3L off of her. HR controlled on amio gtt.   Objective Vitals:   03/27/24 1115 03/27/24 1118 03/27/24 1130 03/27/24 1148  BP: (!) 124/19 (!) 114/99  (!) 90/36  Pulse: 60 65 (!) 58 (!) 58  Resp: 16 20 15 20   Temp:    98.5 F (36.9 C)  TempSrc:      SpO2: 95% 95% 96%   Weight:    128.6 kg  Height:       Exam Gen alert, no distress, on RA No rash, cyanosis or gangrene Sclera anicteric, throat clear  No jvd or bruits Chest clear bilat to bases, no rales/ wheezing RRR no MRG Abd soft ntnd no mass or ascites +bs GU deferred MS no joint effusions or deformity Ext no pitting LE or UE edema, no other edema Neuro is alert, Ox 3 , nf     LUA AVF+ brui  Additional Objective Labs: Basic Metabolic Panel: Recent Labs  Lab 03/23/24 0336 03/26/24 0509 03/27/24 0448  NA 137 134* 133*  K 4.1 4.2 4.4  CL 95* 92* 92*  CO2 27 26 25   GLUCOSE 150* 112* 109*  BUN 41* 36* 49*  CREATININE 9.48* 8.33* 10.23*  CALCIUM  9.2 9.8 9.8   Liver Function Tests: Recent Labs  Lab 03/22/24 0954 03/23/24 0336  AST 25 20  ALT 13 11  ALKPHOS 87 78  BILITOT 0.9 0.8  PROT 8.1 6.8  ALBUMIN  3.7 3.1*   Recent Labs  Lab 03/22/24 0954  LIPASE 33   CBC: Recent Labs  Lab 03/22/24 0954 03/22/24 1002 03/23/24 0336 03/24/24 0444 03/25/24 0601 03/26/24 0509 03/27/24 0448  WBC 6.7  --  6.5 6.6 6.6 5.8 5.8  NEUTROABS 4.6  --   --   --   --   --   --   HGB 10.4*   < > 9.5* 9.7* 9.9* 9.6* 10.0*  HCT 33.2*   < > 30.4* 30.4* 30.8* 29.8* 31.0*  MCV 98.8  --  99.3 98.4 97.2 97.7 96.6  PLT 116*  --  108* 115* 125* 126* 149*   < > = values in this interval not displayed.    CBG: Recent Labs  Lab  03/25/24 2101 03/26/24 0832 03/26/24 1156 03/26/24 1616 03/26/24 2054  GLUCAP 152* 111* 116* 128* 123*    Medications:  sodium chloride      amiodarone  30 mg/hr (03/27/24 0949)    busPIRone   5 mg Oral BID    ceFAZolin  (ANCEF ) IV  3 g Intravenous To SSTC   DULoxetine   60 mg Oral Daily   insulin  aspart  0-6 Units Subcutaneous TID WC   lidocaine   3 patch Transdermal Q24H   metoprolol  tartrate  25 mg Oral BID   midodrine   10 mg Oral BID   pantoprazole   40 mg Oral Daily   rosuvastatin   10 mg Oral QHS   sodium chloride  flush  3 mL Intravenous Q12H   sodium chloride  flush  3 mL Intravenous Q12H    Home bp meds: Metoprolol  25 bid Midodrine  10mg  bid    OP HD: TTS SW 4h  B400  123.8kg  2K   AVF  Heparin  2500 Last full OP HD 7/31, post HD wt 127.5kg Comes off anywhere from 2-5kg over        Assessment/ Plan: Afib/ flutter: w/ borderline hypotension, Longstanding problem with failed multiple cardioversions. She has failed Tikosyn  in the past. HR 110-150. Pt was given IV amio and cardioverted by cardiology (hx of same). Per cardiology planning leadless PPM then AV nodal ablation on 03/27/24. 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 Last EF in Feb 25 showed EF 55-60%, indeterminate diastolic function, mildly reduced RV function ESRD: on HD TTS. No complications with HD. She is way above her EDW currently. Next HD 03/29/24  BP: getting her home metoprolol  and midodrine  as at home.  Volume: CXR clear, exam w/o gross edema, on RA, up 4-5kg at minimum. Max UF w/ HD. Anemia of esrd: 9-12 Hgb, follow. BMD: Corrected Ca 10.5 not on VDRA. Needs add on phos HLD: continue rosuvastatin . DM2: on SSI per primary   Belvie Och, NP 03/27/2024, 12:03 PM  [facility]

## 2024-03-27 NOTE — Progress Notes (Signed)
 PROGRESS NOTE    [name]  FMW:[id number] DOB: Sep 25, 1955 DOA: 03/22/2024 PCP: Celestia Rosaline SQUIBB, NP  Chief Complaint  Patient presents with   Shortness of Breath    Brief Narrative:   [name] is [name] 68 y.o. female with medical history significant of hypertension, hypertension, hyperlipidemia, diabetes, GERD, CAD, atrial fibrillation, chronic diastolic CHF, anxiety, depression, ESRD on HD, OSA on CPAP, obesity, COPD, QT prolongation, status post mitral valve and tricuspid valve repair, DVT presented with shortness of breath and tachycardia. Admitted to TRH service for persistent Afib, Aflutter. EP consulted.   Assessment & Plan:   Active Problems:   ESRD (end stage renal disease) (HCC)   GERD (gastroesophageal reflux disease)   OSA on CPAP   COPD (chronic obstructive pulmonary disease) (HCC)   Class 3 obesity   Type 2 diabetes mellitus with hyperlipidemia (HCC)   S/P mitral valve repair   S/P tricuspid valve repair   Chronic diastolic CHF (congestive heart failure) (HCC)   QT prolongation   Atrial flutter with rapid ventricular response (HCC)   Anxiety and depression   Chronic deep vein thrombosis (DVT) of other vein of right upper extremity (HCC)   HLD (hyperlipidemia)   CAD in native artery   Persistent atrial fibrillation (HCC)   Atrial fibrillation with RVR (HCC)  Atrial Fibrillation Flutter with RVR Per cards, appreciate assistance - failed AADs and DCCS in past despite amio.  Plan is for leadless PPM -> AV nodal ablation.   Metoprolol , amiodarone    ESRD Volume per renal   Hypotension BP appropriate, currently on midodrine  BID (also getting metoprolol )    Chronic diastolic CHF Status post tricuspid valve repair and mitral valve repair Last echo in February 2025 with EF 55-60%, indeterminate diastolic function, mildly reduced RV function.  Volume management with hemodialysis Continue home dose metoprolol    Back Pain  Since fall prior  to admission  Plain films without acute fx or subluxation Will reevaluate after procedure/dialysis   Hyperlipidemia Continue rosuvastatin    Diabetes A1C 5. Lantus  on hold. Sugars stable. Accucheks, sliding scale insulin  as per protocol   GERD Continue PPI.   Anxiety Depression Continue BuSpar , duloxetine    OSA Continue home CPAP Needs outpatient sleep study   Morbid obesity BMI 45.42   COPD Continue home PRN albuterol    QTc prolongation Initial EKG with QTc of 527.  On repeat EKG QTc improved to 477. Caution with QT prolonging meds   History of DVT Heparin  drip for now, transition to Eliquis  upon discharge.  Obesity, Class IV Body mass index is 44.4 kg/m.    DVT prophylaxis: heparin  gtt Code Status: full Family Communication: none Disposition:   Status is: Observation The patient remains OBS appropriate and will d/c before 2 midnights.   Consultants:  cardiology  Procedures:  none  Antimicrobials:  Anti-infectives (From admission, onward)    Start     Dose/Rate Route Frequency Ordered Stop   03/27/24 0600  ceFAZolin  (ANCEF ) IVPB 3g/150 mL premix        3 g 300 mL/hr over 30 Minutes Intravenous To Short Stay 03/27/24 0107 03/27/24 1520   03/27/24 0200  ceFAZolin  (ANCEF ) IVPB 3g/150 mL premix  Status:  Discontinued        3 g 300 mL/hr over 30 Minutes Intravenous On call 03/27/24 0106 03/27/24 0107       Subjective: C/o back pain   Objective: Vitals:   03/27/24 1130 03/27/24 1148 03/27/24 1205 03/27/24 1256  BP:  ROLLEN)  90/36 (!) 98/50 (!) 98/50  Pulse: (!) 58 (!) 58 63 65  Resp: 15 20 20    Temp:  98.5 F (36.9 C) 98.4 F (36.9 C)   TempSrc:   Oral   SpO2: 96%  96%   Weight:  128.6 kg    Height:        Intake/Output Summary (Last 24 hours) at 03/27/2024 1552 Last data filed at 03/27/2024 1544 Gross per 24 hour  Intake 1812.96 ml  Output 3000 ml  Net -1187.04 ml   Filed Weights   03/25/24 1240 03/27/24 0740 03/27/24 1148  Weight:  131 kg 131.1 kg 128.6 kg    Examination:  General: No acute distress. Seen during dialysis  Cardiovascular: RRR Lungs: unlabored Neurological: Alert and oriented 3. Moves all extremities 4 with equal strength. Cranial nerves II through XII grossly intact. Extremities: No clubbing or cyanosis. No edema.   Data Reviewed: I have personally reviewed following labs and imaging studies  CBC: Recent Labs  Lab 03/22/24 0954 03/22/24 1002 03/23/24 0336 03/24/24 0444 03/25/24 0601 03/26/24 0509 03/27/24 0448  WBC 6.7  --  6.5 6.6 6.6 5.8 5.8  NEUTROABS 4.6  --   --   --   --   --   --   HGB 10.4*   < > 9.5* 9.7* 9.9* 9.6* 10.0*  HCT 33.2*   < > 30.4* 30.4* 30.8* 29.8* 31.0*  MCV 98.8  --  99.3 98.4 97.2 97.7 96.6  PLT 116*  --  108* 115* 125* 126* 149*   < > = values in this interval not displayed.    Basic Metabolic Panel: Recent Labs  Lab 03/22/24 0954 03/22/24 1002 03/23/24 0336 03/26/24 0509 03/27/24 0448  NA 136 137 137 134* 133*  K 3.8 3.9 4.1 4.2 4.4  CL 94* 96* 95* 92* 92*  CO2 30  --  27 26 25   GLUCOSE 185* 190* 150* 112* 109*  BUN 27* 31* 41* 36* 49*  CREATININE 7.45* 7.80* 9.48* 8.33* 10.23*  CALCIUM  9.1  --  9.2 9.8 9.8  MG 2.0  --   --   --   --     GFR: Estimated Creatinine Clearance: 7.4 mL/min (Earle Troiano) (by C-G formula based on SCr of 10.23 mg/dL (H)).  Liver Function Tests: Recent Labs  Lab 03/22/24 0954 03/23/24 0336  AST 25 20  ALT 13 11  ALKPHOS 87 78  BILITOT 0.9 0.8  PROT 8.1 6.8  ALBUMIN  3.7 3.1*    CBG: Recent Labs  Lab 03/26/24 0832 03/26/24 1156 03/26/24 1616 03/26/24 2054 03/27/24 1234  GLUCAP 111* 116* 128* 123* 89     No results found for this or any previous visit (from the past 240 hours).       Radiology Studies: No results found.      Scheduled Meds:  [MAR Hold] busPIRone   5 mg Oral BID   [MAR Hold] DULoxetine   60 mg Oral Daily   [MAR Hold] insulin  aspart  0-6 Units Subcutaneous TID WC   [MAR Hold]  lidocaine   3 patch Transdermal Q24H   [MAR Hold] metoprolol  tartrate  25 mg Oral BID   [MAR Hold] midodrine   10 mg Oral BID   [MAR Hold] pantoprazole   40 mg Oral Daily   [MAR Hold] rosuvastatin   10 mg Oral QHS   [MAR Hold] sodium chloride  flush  3 mL Intravenous Q12H   sodium chloride  flush  3 mL Intravenous Q12H   Continuous Infusions:  sodium chloride   amiodarone  30 mg/hr (03/27/24 1441)     LOS: 1 day    Time spent: over 30 min     Meliton Monte, MD [facility]   To contact the attending provider between 7A-7P or the covering provider during after hours 7P-7A, please log into the web site [email or web address] and access using universal [place] password for that web site. If you do not have the password, please call the hospital operator.  03/27/2024, 3:52 PM

## 2024-03-27 NOTE — Transfer of Care (Signed)
 Immediate Anesthesia Transfer of Care Note  Patient: [name]  Procedure(s) Performed: PACEMAKER LEADLESS INSERTION  Patient Location: PACU  Anesthesia Type:General  Level of Consciousness: awake, alert , and oriented  Airway & Oxygen  Therapy: Patient Spontanous Breathing and Patient connected to face mask oxygen   Post-op Assessment: Report given to RN and Post -op Vital signs reviewed and stable  Post vital signs: Reviewed and stable  Last Vitals:  Vitals Value Taken Time  BP 110/75 03/27/24 16:32  Temp    Pulse 64 03/27/24 16:35  Resp 22 03/27/24 16:35  SpO2 100 % 03/27/24 16:35  Vitals shown include unfiled device data.  Last Pain:  Vitals:   03/27/24 1205  TempSrc: Oral  PainSc: 0-No pain         Complications: There were no known notable events for this encounter.

## 2024-03-27 NOTE — Progress Notes (Addendum)
 Patient Name: [name] Date of Encounter: 03/27/2024  Primary Cardiologist: Shelda Bruckner, MD Electrophysiologist: OLE ONEIDA HOLTS, MD  Interval Summary   No new complaints  Vital Signs    Vitals:   03/27/24 0756 03/27/24 0800 03/27/24 0830 03/27/24 0908  BP: (!) 93/56 (!) 103/50 109/84 (!) 131/106  Pulse: (!) 57 (!) 55 67 (!) 52  Resp: 20 17 (!) 24 17  Temp:      TempSrc:      SpO2: 96% 99% 96% 94%  Weight:      Height:        Intake/Output Summary (Last 24 hours) at 03/27/2024 0954 Last data filed at 03/27/2024 0353 Gross per 24 hour  Intake 1562.96 ml  Output --  Net 1562.96 ml   Filed Weights   03/25/24 0810 03/25/24 1240 03/27/24 0740  Weight: 135 kg 131 kg 131.1 kg    Physical Exam    GEN- NAD, Alert and oriented  Lungs- Clear to ausculation bilaterally, normal work of breathing Cardiac- Regular rate and rhythm, no murmurs, rubs or gallops GI- soft, NT, ND, + BS Extremities- no clubbing or cyanosis. No edema  Telemetry    SB/NSR 50-60s (personally reviewed)  Hospital Course    [name] is a 68 y.o. female with h/o hypertension, hypertension, hyperlipidemia, diabetes, GERD, CAD, atrial fibrillation, chronic diastolic CHF, anxiety, depression, ESRD on HD, OSA on CPAP, obesity, COPD, QT prolongation, status post mitral valve and tricuspid valve repair, DVT presented with shortness of breath and tachycardia.   Assessment & Plan  Persistent atrial fibrillation/flutter:  Highly symptomatic.  Limits dialysis.  Maintaining NSR currently, but has failed multiple AADs and DCCS in the past despite amiodarone .   Plan for leadless PPM this afternoon.   Then will likely continue high dose amiodarone  as bridge to AV nodal ablation.   Heparin  on hold      ESRD on HD Volume status managed with dialysis, T/Th/Saturday Getting HD this am.   For questions or updates, please contact [facility] Please consult  [email or web address] for contact info under     Signed, Ozell Prentice Passey, PA-C  03/27/2024, 9:54 AM     I have seen, examined the patient, and reviewed the above assessment and plan.    Interval:  No acute overnight events. Received dialysis this morning. Some fatigue but otherwise feeling relatively well. No new or acute complaints.   General: Well developed, in no acute distress.  Neck: No JVD.  Cardiac: Bradycardic, regular rhythm.  Resp: Normal work of breathing.  Ext: No edema.  Neuro: No gross focal deficits.  Psych: Normal affect.   Assessment and Plan:  #. Persistent atrial fibrillation/flutter: Highly symptomatic. Limiting dialysis. Recurrent with fast rates despite AAD therapy - previously failed Tikosyn  and now on amiodarone . Numerous cardioversions. Will likely recur in future, likely precipitated by fluid shifts in setting of HD. Not an AF ablation candidate. Best course of action would be leadless pacemaker implant then AV node ablation when she recurs.  - Continue IV amiodarone  for now. Decreased to 30mg /hr. Will likely transition to oral amiodarone  tomorrow. - Heparin  gtt off at 0600. -Explained risks, benefits, and alternatives to leadless pacemaker implantation, including but not limited to bleeding, infection, damage to heart or lungs, heart attack, stroke, or death.  Pt verbalized understanding and agrees to proceed if indicated.      #. ESRD on HD - Volume status managed with dialysis.   Leslie Kitty, MD 03/27/2024 1:08 PM

## 2024-03-27 NOTE — Anesthesia Preprocedure Evaluation (Addendum)
 Anesthesia Evaluation  Patient identified by MRN, date of birth, ID band Patient awake    Reviewed: Allergy & Precautions, H&P , NPO status , Patient's Chart, lab work & pertinent test results, reviewed documented beta blocker date and time   History of Anesthesia Complications Negative for: history of anesthetic complications  Airway Mallampati: II  TM Distance: >3 FB Neck ROM: Full    Dental  (+) Partial Lower, Partial Upper, Dental Advisory Given   Pulmonary asthma , sleep apnea and Continuous Positive Airway Pressure Ventilation , COPD,  COPD inhaler   Pulmonary exam normal        Cardiovascular hypertension, Pt. on medications and Pt. on home beta blockers + CAD and +CHF  + dysrhythmias Atrial Fibrillation + Valvular Problems/Murmurs  Rhythm:Irregular Rate:Normal   '25 TTE - EF 55-60%. RV mildly enlarged with mildly reduced systolic function. LA and RA severely dilated. S/p MV repair with mild residual MR (mean diastolic gradient 8 mmHg at 68bpm). S/p TV repair, moderate TR.     Neuro/Psych  PSYCHIATRIC DISORDERS Anxiety Depression    negative neurological ROS     GI/Hepatic Neg liver ROS,GERD  Medicated and Controlled,,  Endo/Other  diabetes, Type 2, Insulin  Dependent  Class 3 obesity  Renal/GU ESRF and DialysisRenal disease  negative genitourinary   Musculoskeletal  (+) Arthritis , Osteoarthritis,    Abdominal   Peds  Hematology  (+) Blood dyscrasia, anemia  On eliquis     Anesthesia Other Findings   Reproductive/Obstetrics negative OB ROS                              Anesthesia Physical Anesthesia Plan  ASA: 3  Anesthesia Plan: General   Post-op Pain Management: Minimal or no pain anticipated   Induction: Intravenous  PONV Risk Score and Plan: 3 and Treatment may vary due to age or medical condition, Ondansetron  and Dexamethasone   Airway Management Planned: Oral  ETT  Additional Equipment: None  Intra-op Plan:   Post-operative Plan: Extubation in OR  Informed Consent: I have reviewed the patients History and Physical, chart, labs and discussed the procedure including the risks, benefits and alternatives for the proposed anesthesia with the patient or authorized representative who has indicated his/her understanding and acceptance.     Dental advisory given  Plan Discussed with: CRNA and Anesthesiologist  Anesthesia Plan Comments:          Anesthesia Quick Evaluation

## 2024-03-27 NOTE — Progress Notes (Signed)
 PHARMACY - ANTICOAGULATION CONSULT NOTE  Pharmacy Consult for heparin  Indication: atrial fibrillation  Allergies  Allergen Reactions   Bee Pollen Anaphylaxis and Other (See Comments)    UNCONFIRMED, but IS allergic to bee VENOM   Bee Venom Anaphylaxis   Sulfa Antibiotics Itching and Rash   Chlorhexidine      Unknown rxn.   Iodinated Contrast Media Nausea And Vomiting    Patient Measurements: Height: 5' 7 (170.2 cm) Weight: 128.6 kg (283 lb 8.2 oz) IBW/kg (Calculated) : 61.6 HEPARIN  DW (KG): 93.4  Vital Signs: Temp: 97.2 F (36.2 C) (08/07 1633) Temp Source: Temporal (08/07 1633) BP: 139/72 (08/07 1645) Pulse Rate: 65 (08/07 1645)  Labs: Recent Labs    03/25/24 0601 03/25/24 1424 03/26/24 0509 03/27/24 0448  HGB 9.9*  --  9.6* 10.0*  HCT 30.8*  --  29.8* 31.0*  PLT 125*  --  126* 149*  APTT  --  101* 101*  --   HEPARINUNFRC  --  >1.10* 1.00*  --   CREATININE  --   --  8.33* 10.23*    Estimated Creatinine Clearance: 7.4 mL/min (A) (by C-G formula based on SCr of 10.23 mg/dL (H)).   Assessment: 68 yo female with a PMH significant for HTN, HLD, DM, GERD, CAD, Afib, ESRD on HD, and diastolic HF. Patient was cardioverted on 8/2. Pharmacy was consulted to initiate heparin  for atrial fibrillation due to PPM procedure.  Patient was on apixaban , with the last dose being 8/3 AM. -aPTT 101 and at goal on heparin  at 1400 units/hr -Heparin  level 1.1 due to recent apixaban  -plans noted for PPM 8/7  Heparin  off since 0115 - now s/p PPM and okay to restart heparin  without bolus at 2000.  Goal of Therapy:  Heparin  level 0.3-0.7 units/ml aPTT 66-102 seconds Monitor platelets by anticoagulation protocol: Yes   Plan:  At 2000, restart heparin  1300 units/hr. No bolus. F/u aPTT and heparin  level 8 hours post restart Daily heparin  level, aPTT, and CBC

## 2024-03-27 NOTE — Anesthesia Procedure Notes (Signed)
 Procedure Name: Intubation Date/Time: 03/27/2024 2:57 PM  Performed by: Zala Degrasse A, CRNAPre-anesthesia Checklist: Patient identified, Emergency Drugs available, Suction available and Patient being monitored Patient Re-evaluated:Patient Re-evaluated prior to induction Oxygen  Delivery Method: Circle System Utilized Preoxygenation: Pre-oxygenation with 100% oxygen  Induction Type: IV induction Ventilation: Mask ventilation without difficulty Laryngoscope Size: Mac and 3 Grade View: Grade II Tube type: Oral Tube size: 7.5 mm Number of attempts: 1 Airway Equipment and Method: Stylet and Oral airway Placement Confirmation: ETT inserted through vocal cords under direct vision, positive ETCO2 and breath sounds checked- equal and bilateral Secured at: 22 cm Tube secured with: Tape Dental Injury: Teeth and Oropharynx as per pre-operative assessment

## 2024-03-27 NOTE — Interval H&P Note (Signed)
 History and Physical Interval Note:  03/27/2024 1:15 PM  [name]  has presented today for surgery, with the diagnosis of longstanding persistent atrial fibrillation and tachycardia-bradycardia syndrome.  The various methods of treatment have been discussed with the patient and family. After consideration of risks, benefits and other options for treatment, the patient has consented to  Procedure(s): PACEMAKER LEADLESS INSERTION (N/A) as a surgical intervention.  The patient's history has been reviewed, patient examined, no change in status, stable for surgery.  I have reviewed the patient's chart and labs.  Questions were answered to the patient's satisfaction.     [name]

## 2024-03-27 NOTE — Plan of Care (Signed)
  Problem: Coping: Goal: Ability to adjust to condition or change in health will improve Outcome: Progressing   Problem: Health Behavior/Discharge Planning: Goal: Ability to manage health-related needs will improve Outcome: Progressing   Problem: Metabolic: Goal: Ability to maintain appropriate glucose levels will improve Outcome: Progressing   Problem: Nutritional: Goal: Maintenance of adequate nutrition will improve Outcome: Progressing   Problem: Nutritional: Goal: Progress toward achieving an optimal weight will improve Outcome: Progressing   Problem: Skin Integrity: Goal: Risk for impaired skin integrity will decrease Outcome: Progressing   Problem: Education: Goal: Knowledge of General Education information will improve Description: Including pain rating scale, medication(s)/side effects and non-pharmacologic comfort measures Outcome: Progressing   Problem: Clinical Measurements: Goal: Will remain free from infection Outcome: Progressing   Problem: Clinical Measurements: Goal: Cardiovascular complication will be avoided Outcome: Progressing   Problem: Activity: Goal: Risk for activity intolerance will decrease Outcome: Progressing

## 2024-03-27 NOTE — H&P (View-Only) (Signed)
 Patient Name: [name] Date of Encounter: 03/27/2024  Primary Cardiologist: Shelda Bruckner, MD Electrophysiologist: OLE ONEIDA HOLTS, MD  Interval Summary   No new complaints  Vital Signs    Vitals:   03/27/24 0756 03/27/24 0800 03/27/24 0830 03/27/24 0908  BP: (!) 93/56 (!) 103/50 109/84 (!) 131/106  Pulse: (!) 57 (!) 55 67 (!) 52  Resp: 20 17 (!) 24 17  Temp:      TempSrc:      SpO2: 96% 99% 96% 94%  Weight:      Height:        Intake/Output Summary (Last 24 hours) at 03/27/2024 0954 Last data filed at 03/27/2024 0353 Gross per 24 hour  Intake 1562.96 ml  Output --  Net 1562.96 ml   Filed Weights   03/25/24 0810 03/25/24 1240 03/27/24 0740  Weight: 135 kg 131 kg 131.1 kg    Physical Exam    GEN- NAD, Alert and oriented  Lungs- Clear to ausculation bilaterally, normal work of breathing Cardiac- Regular rate and rhythm, no murmurs, rubs or gallops GI- soft, NT, ND, + BS Extremities- no clubbing or cyanosis. No edema  Telemetry    SB/NSR 50-60s (personally reviewed)  Hospital Course    [name] is a 68 y.o. female with h/o hypertension, hypertension, hyperlipidemia, diabetes, GERD, CAD, atrial fibrillation, chronic diastolic CHF, anxiety, depression, ESRD on HD, OSA on CPAP, obesity, COPD, QT prolongation, status post mitral valve and tricuspid valve repair, DVT presented with shortness of breath and tachycardia.   Assessment & Plan  Persistent atrial fibrillation/flutter:  Highly symptomatic.  Limits dialysis.  Maintaining NSR currently, but has failed multiple AADs and DCCS in the past despite amiodarone .   Plan for leadless PPM this afternoon.   Then will likely continue high dose amiodarone  as bridge to AV nodal ablation.   Heparin  on hold      ESRD on HD Volume status managed with dialysis, T/Th/Saturday Getting HD this am.   For questions or updates, please contact [facility] Please consult  [email or web address] for contact info under     Signed, Ozell Prentice Passey, PA-C  03/27/2024, 9:54 AM     I have seen, examined the patient, and reviewed the above assessment and plan.    Interval:  No acute overnight events. Received dialysis this morning. Some fatigue but otherwise feeling relatively well. No new or acute complaints.   General: Well developed, in no acute distress.  Neck: No JVD.  Cardiac: Bradycardic, regular rhythm.  Resp: Normal work of breathing.  Ext: No edema.  Neuro: No gross focal deficits.  Psych: Normal affect.   Assessment and Plan:  #. Persistent atrial fibrillation/flutter: Highly symptomatic. Limiting dialysis. Recurrent with fast rates despite AAD therapy - previously failed Tikosyn  and now on amiodarone . Numerous cardioversions. Will likely recur in future, likely precipitated by fluid shifts in setting of HD. Not an AF ablation candidate. Best course of action would be leadless pacemaker implant then AV node ablation when she recurs.  - Continue IV amiodarone  for now. Decreased to 30mg /hr. Will likely transition to oral amiodarone  tomorrow. - Heparin  gtt off at 0600. -Explained risks, benefits, and alternatives to leadless pacemaker implantation, including but not limited to bleeding, infection, damage to heart or lungs, heart attack, stroke, or death.  Pt verbalized understanding and agrees to proceed if indicated.      #. ESRD on HD - Volume status managed with dialysis.   [name], MD 03/27/2024 1:08 PM

## 2024-03-27 NOTE — Progress Notes (Signed)
   03/27/24 1148  Vitals  Temp 98.5 F (36.9 C)  Pulse Rate (!) 58  Resp 20  BP (!) 90/36  Weight 128.6 kg  Type of Weight Post-Dialysis  Post Treatment  Dialyzer Clearance Lightly streaked  Liters Processed 84  Fluid Removed (mL) 3000 mL  Tolerated HD Treatment Yes  AVG/AVF Arterial Site Held (minutes) 5 minutes  AVG/AVF Venous Site Held (minutes) 5 minutes   Received patient in bed to unit.  Alert and oriented.  Informed consent signed and in chart.   TX duration:3.5  Patient tolerated well.  Transported back to the room  Alert, without acute distress.  Hand-off given to patient's nurse.   Access used: Yes Access issues: No  Total UF removed: 3000 ml removed instead of 4L per order, D/T patient SBP would not tolerated previous UF Goal Medication(s) given: No Medication given Post HD VS: See Above Grid Post HD weight: 128.6 kg   [name]

## 2024-03-28 ENCOUNTER — Inpatient Hospital Stay (HOSPITAL_COMMUNITY)

## 2024-03-28 ENCOUNTER — Other Ambulatory Visit (INDEPENDENT_AMBULATORY_CARE_PROVIDER_SITE_OTHER): Payer: Self-pay | Admitting: Primary Care

## 2024-03-28 ENCOUNTER — Encounter (HOSPITAL_COMMUNITY): Payer: Self-pay | Admitting: Cardiology

## 2024-03-28 DIAGNOSIS — N186 End stage renal disease: Secondary | ICD-10-CM | POA: Diagnosis not present

## 2024-03-28 DIAGNOSIS — I132 Hypertensive heart and chronic kidney disease with heart failure and with stage 5 chronic kidney disease, or end stage renal disease: Secondary | ICD-10-CM | POA: Diagnosis not present

## 2024-03-28 DIAGNOSIS — I4892 Unspecified atrial flutter: Secondary | ICD-10-CM | POA: Diagnosis not present

## 2024-03-28 DIAGNOSIS — I4819 Other persistent atrial fibrillation: Secondary | ICD-10-CM | POA: Diagnosis not present

## 2024-03-28 DIAGNOSIS — Z8616 Personal history of COVID-19: Secondary | ICD-10-CM | POA: Diagnosis not present

## 2024-03-28 LAB — HEPARIN LEVEL (UNFRACTIONATED): Heparin Unfractionated: 0.47 [IU]/mL (ref 0.30–0.70)

## 2024-03-28 LAB — BASIC METABOLIC PANEL WITH GFR
Anion gap: 17 — ABNORMAL HIGH (ref 5–15)
BUN: 33 mg/dL — ABNORMAL HIGH (ref 8–23)
CO2: 24 mmol/L (ref 22–32)
Calcium: 10.2 mg/dL (ref 8.9–10.3)
Chloride: 95 mmol/L — ABNORMAL LOW (ref 98–111)
Creatinine, Ser: 7.17 mg/dL — ABNORMAL HIGH (ref 0.44–1.00)
GFR, Estimated: 6 mL/min — ABNORMAL LOW
Glucose, Bld: 170 mg/dL — ABNORMAL HIGH (ref 70–99)
Potassium: 5.2 mmol/L — ABNORMAL HIGH (ref 3.5–5.1)
Sodium: 136 mmol/L (ref 135–145)

## 2024-03-28 LAB — CBC
HCT: 30.3 % — ABNORMAL LOW (ref 36.0–46.0)
Hemoglobin: 9.9 g/dL — ABNORMAL LOW (ref 12.0–15.0)
MCH: 31.5 pg (ref 26.0–34.0)
MCHC: 32.7 g/dL (ref 30.0–36.0)
MCV: 96.5 fL (ref 80.0–100.0)
Platelets: 130 K/uL — ABNORMAL LOW (ref 150–400)
RBC: 3.14 MIL/uL — ABNORMAL LOW (ref 3.87–5.11)
RDW: 14.9 % (ref 11.5–15.5)
WBC: 6.9 K/uL (ref 4.0–10.5)
nRBC: 0 % (ref 0.0–0.2)

## 2024-03-28 LAB — GLUCOSE, CAPILLARY
Glucose-Capillary: 178 mg/dL — ABNORMAL HIGH (ref 70–99)
Glucose-Capillary: 297 mg/dL — ABNORMAL HIGH (ref 70–99)
Glucose-Capillary: 152 mg/dL — ABNORMAL HIGH (ref 70–99)
Glucose-Capillary: 155 mg/dL — ABNORMAL HIGH (ref 70–99)

## 2024-03-28 LAB — APTT: aPTT: 65 s — ABNORMAL HIGH (ref 24–36)

## 2024-03-28 MED ORDER — APIXABAN 5 MG PO TABS
5.0000 mg | ORAL_TABLET | Freq: Two times a day (BID) | ORAL | Status: DC
  Administered 2024-03-28 – 2024-03-29 (×3): 5 mg via ORAL
  Filled 2024-03-28 (×3): qty 1

## 2024-03-28 MED ORDER — GABAPENTIN 100 MG PO CAPS
100.0000 mg | ORAL_CAPSULE | Freq: Once | ORAL | Status: AC
  Administered 2024-03-28: 100 mg via ORAL
  Filled 2024-03-28: qty 1

## 2024-03-28 MED ORDER — AMIODARONE HCL 200 MG PO TABS
200.0000 mg | ORAL_TABLET | Freq: Two times a day (BID) | ORAL | Status: DC
  Administered 2024-03-28 – 2024-03-29 (×3): 200 mg via ORAL
  Filled 2024-03-28 (×3): qty 1

## 2024-03-28 MED ORDER — ACETAMINOPHEN 500 MG PO TABS
1000.0000 mg | ORAL_TABLET | Freq: Three times a day (TID) | ORAL | Status: DC
  Administered 2024-03-28 – 2024-03-29 (×4): 1000 mg via ORAL
  Filled 2024-03-28 (×4): qty 2

## 2024-03-28 MED ORDER — GABAPENTIN 100 MG PO CAPS: 100.0000 mg | ORAL_CAPSULE | ORAL | Status: DC

## 2024-03-28 MED ORDER — DIPHENHYDRAMINE HCL 25 MG PO CAPS
25.0000 mg | ORAL_CAPSULE | Freq: Four times a day (QID) | ORAL | Status: DC | PRN
  Administered 2024-03-29 (×2): 25 mg via ORAL
  Filled 2024-03-28: qty 1

## 2024-03-28 MED FILL — Fentanyl Citrate Preservative Free (PF) Inj 100 MCG/2ML: INTRAMUSCULAR | Qty: 1 | Status: AC

## 2024-03-28 NOTE — Progress Notes (Signed)
 Mobility Specialist Progress Note;   03/28/24 1402  Mobility  Activity  (Bed level exercises)  Range of Motion/Exercises Active Assistive;Right leg;Left leg  Activity Response Tolerated well  Mobility Referral Yes  Mobility visit 1 Mobility  Mobility Specialist Start Time (ACUTE ONLY) 1402  Mobility Specialist Stop Time (ACUTE ONLY) 1410  Mobility Specialist Time Calculation (min) (ACUTE ONLY) 8 min   Pt agreeable to mobility. Mobility limited d/t fatigue, however pt requested to practice BLE exercises. Actively assisted pt in exercises w/o fault. VSS throughout. Pt left with all needs met, call bell in reach.   Lauraine Erm Mobility Specialist Please contact via SecureChat or [employer] [phone]

## 2024-03-28 NOTE — Progress Notes (Signed)
 PROGRESS NOTE    [name]  FMW:[id number] DOB: 12/29/1955 DOA: 03/22/2024 PCP: Celestia Rosaline SQUIBB, NP  Chief Complaint  Patient presents with   Shortness of Breath    Brief Narrative:   [name] is [name] 68 y.o. female with medical history significant of hypertension, hypertension, hyperlipidemia, diabetes, GERD, CAD, atrial fibrillation, chronic diastolic CHF, anxiety, depression, ESRD on HD, OSA on CPAP, obesity, COPD, QT prolongation, status post mitral valve and tricuspid valve repair, DVT presented with shortness of breath and tachycardia. Admitted to [facility] service for persistent Afib, Aflutter. EP consulted.   Assessment & Plan:   Active Problems:   ESRD (end stage renal disease) (HCC)   GERD (gastroesophageal reflux disease)   OSA on CPAP   COPD (chronic obstructive pulmonary disease) (HCC)   Class 3 obesity   Type 2 diabetes mellitus with hyperlipidemia (HCC)   S/P mitral valve repair   S/P tricuspid valve repair   Chronic diastolic CHF (congestive heart failure) (HCC)   QT prolongation   Atrial flutter with rapid ventricular response (HCC)   Anxiety and depression   Chronic deep vein thrombosis (DVT) of other vein of right upper extremity (HCC)   HLD (hyperlipidemia)   CAD in native artery   Persistent atrial fibrillation (HCC)   Atrial fibrillation with RVR (HCC)  Atrial Fibrillation Flutter with RVR Per cards, appreciate assistance - failed AADs and DCCS in past despite amio.   Now s/p leadless pacemaker implant Metoprolol , amiodarone    Back Pain  Since fall prior to admission (she fell off [employer] chair onto her back/bottom) Plain films without acute fx or subluxation - she notes numbness and RLE weakness (strength almost symmetric on exam, maybe mild weakness noted).  No saddle anesthesia.  Unfortunately, has to wait 6 weeks for MRI with pacemaker placement.   Will get [name] CT lumbar spine   ESRD Volume per renal   Hypotension BP appropriate,  currently on midodrine  BID (she notes this is only on dialysis days, will add holding parameters) (also getting metoprolol )    Chronic diastolic CHF Status post tricuspid valve repair and mitral valve repair Last echo in February 2025 with EF 55-60%, indeterminate diastolic function, mildly reduced RV function.  Volume management with hemodialysis Continue home dose metoprolol   Hyperlipidemia Continue rosuvastatin    Diabetes A1C 5. Lantus  on hold. Sugars stable. Accucheks, sliding scale insulin  as per protocol   GERD Continue PPI.   Anxiety Depression Continue BuSpar , duloxetine    OSA Continue home CPAP Needs outpatient sleep study   Morbid obesity BMI 45.42   COPD Continue home PRN albuterol    QTc prolongation Initial EKG with QTc of 527.  On repeat EKG QTc improved to 477. Caution with QT prolonging meds   History of DVT Heparin  drip for now, transition to Eliquis  upon discharge.  Obesity, Class IV Body mass index is 44.4 kg/m.    DVT prophylaxis: heparin  gtt Code Status: full Family Communication: none Disposition:   Status is: Observation The patient remains OBS appropriate and will d/c before 2 midnights.   Consultants:  cardiology  Procedures:  8/7 CONCLUSIONS:   1. Successful single chamber leadless pacemaker implant.   2. No early apparent complications.   Antimicrobials:  Anti-infectives (From admission, onward)    Start     Dose/Rate Route Frequency Ordered Stop   03/27/24 0600  ceFAZolin  (ANCEF ) IVPB 3g/150 mL premix        3 g 300 mL/hr over 30 Minutes Intravenous To Short Stay 03/27/24  0107 03/27/24 1520   03/27/24 0200  ceFAZolin  (ANCEF ) IVPB 3g/150 mL premix  Status:  Discontinued        3 g 300 mL/hr over 30 Minutes Intravenous On call 03/27/24 0106 03/27/24 0107       Subjective: Continued back pain, numbness and weakness in RLE  Objective: Vitals:   03/28/24 0733 03/28/24 0922 03/28/24 1135 03/28/24 1140  BP: 139/63  138/88 (!) 126/52 (!) 126/52  Pulse: 61 68 72 72  Resp: 20   20  Temp: 98.2 F (36.8 C)   (!) 97.5 F (36.4 C)  TempSrc: Oral   Oral  SpO2: 97% 97% 100% 99%  Weight:      Height:        Intake/Output Summary (Last 24 hours) at 03/28/2024 1141 Last data filed at 03/28/2024 0830 Gross per 24 hour  Intake 1060 ml  Output 3000 ml  Net -1940 ml   Filed Weights   03/25/24 1240 03/27/24 0740 03/27/24 1148  Weight: 131 kg 131.1 kg 128.6 kg    Examination:  General: No acute distress. Cardiovascular: RRR Lungs: unlabored Neurological: Alert and oriented 3. C/o decreased sensation to RLE.  Notes RLE weakness, this is mild if notable on exam.   Msk: midline pain to lumbar spine  Extremities: No clubbing or cyanosis. No edema.   Data Reviewed: I have personally reviewed following labs and imaging studies  CBC: Recent Labs  Lab 03/22/24 0954 03/22/24 1002 03/24/24 0444 03/25/24 0601 03/26/24 0509 03/27/24 0448 03/28/24 0507  WBC 6.7   < > 6.6 6.6 5.8 5.8 6.9  NEUTROABS 4.6  --   --   --   --   --   --   HGB 10.4*   < > 9.7* 9.9* 9.6* 10.0* 9.9*  HCT 33.2*   < > 30.4* 30.8* 29.8* 31.0* 30.3*  MCV 98.8   < > 98.4 97.2 97.7 96.6 96.5  PLT 116*   < > 115* 125* 126* 149* 130*   < > = values in this interval not displayed.    Basic Metabolic Panel: Recent Labs  Lab 03/22/24 0954 03/22/24 1002 03/23/24 0336 03/26/24 0509 03/27/24 0448 03/28/24 0507  NA 136 137 137 134* 133* 136  K 3.8 3.9 4.1 4.2 4.4 5.2*  CL 94* 96* 95* 92* 92* 95*  CO2 30  --  27 26 25 24   GLUCOSE 185* 190* 150* 112* 109* 170*  BUN 27* 31* 41* 36* 49* 33*  CREATININE 7.45* 7.80* 9.48* 8.33* 10.23* 7.17*  CALCIUM  9.1  --  9.2 9.8 9.8 10.2  MG 2.0  --   --   --   --   --     GFR: Estimated Creatinine Clearance: 10.6 mL/min ([name]) (by C-G formula based on SCr of 7.17 mg/dL (H)).  Liver Function Tests: Recent Labs  Lab 03/22/24 0954 03/23/24 0336  AST 25 20  ALT 13 11  ALKPHOS 87 78  BILITOT  0.9 0.8  PROT 8.1 6.8  ALBUMIN  3.7 3.1*    CBG: Recent Labs  Lab 03/27/24 1649 03/27/24 1745 03/27/24 2057 03/28/24 0736 03/28/24 1127  GLUCAP 160* 125* 188* 178* 297*     No results found for this or any previous visit (from the past 240 hours).       Radiology Studies: DG Chest 2 View Result Date: 03/28/2024 CLINICAL DATA:  Pacemaker placement. EXAM: CHEST - 2 VIEW COMPARISON:  03/22/2024 FINDINGS: Low volume lordotic frontal projection. The cardio pericardial silhouette is  enlarged. Lead less pacemaker overlies the left heart, new in the interval. Vascular congestion without overt edema or focal airspace consolidation. No pneumothorax. Telemetry leads overlie the chest. IMPRESSION: Interval placement of leadless pacemaker. No pneumothorax. Electronically Signed   By: Camellia Candle M.D.   On: 03/28/2024 05:58   EP PPM/ICD IMPLANT Result Date: 03/27/2024  CONCLUSIONS:  1. Successful single chamber leadless pacemaker implant.  2. No early apparent complications. [name], MD, [facility], [facility] Cardiac Electrophysiology        Scheduled Meds:  amiodarone   200 mg Oral BID   apixaban   5 mg Oral BID   busPIRone   5 mg Oral BID   DULoxetine   60 mg Oral Daily   insulin  aspart  0-6 Units Subcutaneous TID WC   lidocaine   3 patch Transdermal Q24H   midodrine   10 mg Oral BID   pantoprazole   40 mg Oral Daily   rosuvastatin   10 mg Oral QHS   sodium chloride  flush  3 mL Intravenous Q12H   sodium chloride  flush  3 mL Intravenous Q12H   Continuous Infusions:  sodium chloride      vasopressin  Stopped (03/27/24 1715)     LOS: 2 days    Time spent: over 30 min     Meliton Monte, MD [facility]   To contact the attending provider between 7A-7P or the covering provider during after hours 7P-7A, please log into the web site [email or web address] and access using universal [place] password for that web site. If you do not have the password, please call the hospital  operator.  03/28/2024, 11:41 AM

## 2024-03-28 NOTE — Evaluation (Signed)
 Physical Therapy Evaluation Patient Details Name: [name] MRN: [id number] DOB: 1956-07-26 Today's Date: 03/28/2024  History of Present Illness  68 y.o. female presents to [facility] hospital on 03/22/2024 with SOB and tachycardia, found to be in persistent afib. Pt underwent leadless pacemaker placement on 8/7. 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  Clinical Impression  Pt presents to PT with deficits in activity tolerance, gait, power, endurance. Pt is limited by pain at groin site as well as low back, but still able to transfer and ambulate for household distances without physical assistance. PT encourages frequent mobilization to avoid stiffness in low back. PT offers outpatient PT referral however the pt would prefer to attempt home exercises she has recently received from therapy before trying further outpatient PT services.        If plan is discharge home, recommend the following: A little help with bathing/dressing/bathroom;Assistance with cooking/housework;Assist for transportation;Help with stairs or ramp for entrance   Can travel by private vehicle        Equipment Recommendations None recommended by PT  Recommendations for Other Services       Functional Status Assessment Patient has had a recent decline in their functional status and demonstrates the ability to make significant improvements in function in a reasonable and predictable amount of time.     Precautions / Restrictions Precautions Precautions: Fall Restrictions Weight Bearing Restrictions Per Provider Order: No      Mobility  Bed Mobility Overal bed mobility: Needs Assistance Bed Mobility: Supine to Sit, Sit to Supine     Supine to sit: Modified independent (Device/Increase time) Sit to supine: Min assist   General bed mobility comments: assist for LE management    Transfers Overall transfer level: Needs  assistance Equipment used: Rollator (4 wheels) Transfers: Sit to/from Stand Sit to Stand: Supervision                Ambulation/Gait Ambulation/Gait assistance: Supervision Gait Distance (Feet): 80 Feet (80' x 2 trials) Assistive device: Rollator (4 wheels) Gait Pattern/deviations: Step-through pattern, Wide base of support Gait velocity: reduced Gait velocity interpretation: <1.8 ft/sec, indicate of risk for recurrent falls   General Gait Details: slowed step-through gait  Stairs            Wheelchair Mobility     Tilt Bed    Modified Rankin (Stroke Patients Only)       Balance Overall balance assessment: Needs assistance Sitting-balance support: No upper extremity supported, Feet supported Sitting balance-Leahy Scale: Good     Standing balance support: Single extremity supported, Reliant on assistive device for balance Standing balance-Leahy Scale: Poor                               Pertinent Vitals/Pain Pain Assessment Pain Assessment: 0-10 Pain Score: 8  Pain Location: groin site Pain Descriptors / Indicators: Sore Pain Intervention(s): Monitored during session    Home Living Family/patient expects to be discharged to:: Private residence Living Arrangements: Children Available Help at Discharge: Family;Available 24 hours/day Type of Home: Apartment Home Access: Level entry       Home Layout: One level Home Equipment: Cane - single point;Rollator (4 wheels);Shower seat      Prior Function Prior Level of Function : Independent/Modified Independent;History of Falls (last six months)             Mobility Comments:  Household ambulation with [facility] and community ambulation with rollator. fall recently prior to admission       Extremity/Trunk Assessment   Upper Extremity Assessment Upper Extremity Assessment: Overall WFL for tasks assessed    Lower Extremity Assessment Lower Extremity Assessment: Generalized weakness     Cervical / Trunk Assessment Cervical / Trunk Assessment: Other exceptions Cervical / Trunk Exceptions: body habitus  Communication   Communication Communication: No apparent difficulties    Cognition Arousal: Alert Behavior During Therapy: WFL for tasks assessed/performed   PT - Cognitive impairments: No apparent impairments                         Following commands: Intact       Cueing Cueing Techniques: Verbal cues     General Comments General comments (skin integrity, edema, etc.): VSS on RA    Exercises     Assessment/Plan    PT Assessment Patient needs continued PT services  PT Problem List Decreased strength;Decreased activity tolerance;Decreased balance;Decreased mobility;Cardiopulmonary status limiting activity;Pain       PT Treatment Interventions DME instruction;Gait training;Functional mobility training;Therapeutic activities;Balance training;Therapeutic exercise;Neuromuscular re-education;Patient/family education    PT Goals (Current goals can be found in the Care Plan section)  Acute Rehab PT Goals Patient Stated Goal: to return home PT Goal Formulation: With patient Time For Goal Achievement: 04/11/24 Potential to Achieve Goals: Good    Frequency Min 2X/week     Co-evaluation               AM-PAC PT 6 Clicks Mobility  Outcome Measure Help needed turning from your back to your side while in a flat bed without using bedrails?: None Help needed moving from lying on your back to sitting on the side of a flat bed without using bedrails?: A Little Help needed moving to and from a bed to a chair (including a wheelchair)?: A Little Help needed standing up from a chair using your arms (e.g., wheelchair or bedside chair)?: A Little Help needed to walk in hospital room?: A Little Help needed climbing 3-5 steps with a railing? : Total 6 Click Score: 17    End of Session   Activity Tolerance: Patient tolerated treatment well Patient  left: in bed;with call bell/phone within reach Nurse Communication: Mobility status PT Visit Diagnosis: Other abnormalities of gait and mobility (R26.89);Muscle weakness (generalized) (M62.81);Pain    Time: 8367-8286 PT Time Calculation (min) (ACUTE ONLY): 41 min   Charges:   PT Evaluation $PT Eval Low Complexity: 1 Low   PT General Charges $$ ACUTE PT VISIT: 1 Visit         Bernardino JINNY Ruth, PT, DPT Acute Rehabilitation Office [phone]   Bernardino JINNY Ruth 03/28/2024, 5:22 PM

## 2024-03-28 NOTE — Anesthesia Postprocedure Evaluation (Signed)
 Anesthesia Post Note  Patient: [name]  Procedure(s) Performed: PACEMAKER LEADLESS INSERTION     Patient location during evaluation: PACU Anesthesia Type: General Level of consciousness: awake and alert Pain management: pain level controlled Vital Signs Assessment: post-procedure vital signs reviewed and stable Respiratory status: spontaneous breathing, nonlabored ventilation and respiratory function stable Cardiovascular status: stable and blood pressure returned to baseline Anesthetic complications: no   There were no known notable events for this encounter.  Last Vitals:  Vitals:   03/28/24 1140 03/28/24 1537  BP: (!) 126/52 (!) 105/54  Pulse: 72 70  Resp: 20 20  Temp: (!) 36.4 C 37.1 C  SpO2: 99% 100%    Last Pain:  Vitals:   03/28/24 1537  TempSrc: Oral  PainSc:                  Debby FORBES Like

## 2024-03-28 NOTE — Plan of Care (Signed)

## 2024-03-28 NOTE — Progress Notes (Addendum)
 Patient Name: [name] Date of Encounter: 03/28/2024  Primary Cardiologist: Shelda Bruckner, MD Electrophysiologist: OLE ONEIDA HOLTS, MD  Interval Summary   Now new complaints  S/p Leadless PPM   Vital Signs    Vitals:   03/27/24 2052 03/27/24 2120 03/28/24 0100 03/28/24 0554  BP: 118/61 114/65 123/68 (!) 125/59  Pulse:  67    Resp: 20  20 20   Temp: 98.8 F (37.1 C)  98.3 F (36.8 C) 98 F (36.7 C)  TempSrc: Oral  Oral Oral  SpO2:    96%  Weight:      Height:        Intake/Output Summary (Last 24 hours) at 03/28/2024 0729 Last data filed at 03/27/2024 1635 Gross per 24 hour  Intake 700 ml  Output 3000 ml  Net -2300 ml   Filed Weights   03/25/24 1240 03/27/24 0740 03/27/24 1148  Weight: 131 kg 131.1 kg 128.6 kg    Physical Exam    GEN- NAD, Alert and oriented  Lungs- Clear to ausculation bilaterally, normal work of breathing Cardiac- Regular rate and rhythm, no murmurs, rubs or gallops GI- soft, NT, ND, + BS Extremities- no clubbing or cyanosis. No edema  Telemetry    NSR 60s (personally reviewed)  Hospital Course    TERESHA HANKS is a 68 y.o. female with h/o hypertension, hypertension, hyperlipidemia, diabetes, GERD, CAD, atrial fibrillation, chronic diastolic CHF, anxiety, depression, ESRD on HD, OSA on CPAP, obesity, COPD, QT prolongation, status post mitral valve and tricuspid valve repair, DVT presented with shortness of breath and tachycardia.   Assessment & Plan    Persistent AF/AFL Tachy-brady syndrome S/p Medtronic Micra AV Leadless PPM CXR stable Interrogation stable. Wound care and restrictions with patient  Usual follow up in place  Hopeful that she will be able to tolerate dialysis now without concerns for tachycardia.   Follow up in 6 weeks with Dr. Kennyth to consider AV nodal ablation if she has had further AF  ERSD on HD Volume status per HD. T/Th/Saturday  EP will see as needed while remains  here.  For questions or updates, please contact [facility] Please consult [email or web address] for contact info under     Signed, Ozell Prentice Passey, PA-C  03/28/2024, 7:29 AM    I have seen, examined the patient, and reviewed the above assessment and plan.    Interval: Micra leadless pacer implant yesterday. No acute overnight events. Patient reports feeling relatively well. No new or acute complaints.   General: Well developed, in no acute distress.  Neck: No JVD.  Cardiac: Normal rate, regular rhythm.  Resp: Normal work of breathing.  Ext: No edema.  R Groin: Soft without bleeding or hematoma Neuro: No gross focal deficits.  Psych: Normal affect.   Assessment and Plan:  #. Persistent atrial fibrillation/flutter: Highly symptomatic. Limiting dialysis. Recurrent with fast rates despite AAD therapy - previously failed Tikosyn  and now on amiodarone . Numerous cardioversions. Will likely recur in future, likely precipitated by fluid shifts in setting of HD. Not an AF ablation candidate. Best course of action would be leadless pacemaker implant then AV node ablation when she recurs. Now s/p leadless pacer on 8/7. - Transition to IV amiodarone  to oral amiodarone  200mg  BID.  - Transition heparin  to Eliquis .  - CXR with appropriate device position.  - Device check with appropriate device function and stable lead parameters.    #. ESRD on HD - Volume status managed with dialysis.   Fonda Kennyth, MD  03/28/2024 3:25 PM

## 2024-03-28 NOTE — Progress Notes (Addendum)
 [facility] Progress Note   Subjective:    Seen and examined patient at bedside. Unable to reach max UF during yesterday's HD 2nd low SBPs. 3L was removed. S/p leadless pacemaker 8/7. Denies any acute complaints. Next HD 8/9.  Objective Vitals:   03/28/24 0733 03/28/24 0922 03/28/24 1135 03/28/24 1140  BP: 139/63 138/88 (!) 126/52 (!) 126/52  Pulse: 61 68 72 72  Resp: 20   20  Temp: 98.2 F (36.8 C)   (!) 97.5 F (36.4 C)  TempSrc: Oral   Oral  SpO2: 97% 97% 100% 99%  Weight:      Height:       Physical Exam Gen alert, no distress, on RA Sclera anicteric No jvd or bruits Chest clear bilat to bases, no rales/ wheezing RRR no MRG Abd soft and non-tender Ext no pitting LE or UE edema, no other edema Neuro A&O X 3 LUA AVF+ bruit/thrill  Filed Weights   03/25/24 1240 03/27/24 0740 03/27/24 1148  Weight: 131 kg 131.1 kg 128.6 kg    Intake/Output Summary (Last 24 hours) at 03/28/2024 1326 Last data filed at 03/28/2024 0830 Gross per 24 hour  Intake 1060 ml  Output 0 ml  Net 1060 ml    Additional Objective Labs: Basic Metabolic Panel: Recent Labs  Lab 03/26/24 0509 03/27/24 0448 03/28/24 0507  NA 134* 133* 136  K 4.2 4.4 5.2*  CL 92* 92* 95*  CO2 26 25 24   GLUCOSE 112* 109* 170*  BUN 36* 49* 33*  CREATININE 8.33* 10.23* 7.17*  CALCIUM  9.8 9.8 10.2   Liver Function Tests: Recent Labs  Lab 03/22/24 0954 03/23/24 0336  AST 25 20  ALT 13 11  ALKPHOS 87 78  BILITOT 0.9 0.8  PROT 8.1 6.8  ALBUMIN  3.7 3.1*   Recent Labs  Lab 03/22/24 0954  LIPASE 33   CBC: Recent Labs  Lab 03/22/24 0954 03/22/24 1002 03/24/24 0444 03/25/24 0601 03/26/24 0509 03/27/24 0448 03/28/24 0507  WBC 6.7   < > 6.6 6.6 5.8 5.8 6.9  NEUTROABS 4.6  --   --   --   --   --   --   HGB 10.4*   < > 9.7* 9.9* 9.6* 10.0* 9.9*  HCT 33.2*   < > 30.4* 30.8* 29.8* 31.0* 30.3*  MCV 98.8   < > 98.4 97.2 97.7 96.6 96.5  PLT 116*   < > 115* 125* 126* 149* 130*   < > =  values in this interval not displayed.   Blood Culture    Component Value Date/Time   SDES  09/14/2022 0840    BLOOD RIGHT HAND Performed at [facility], [address], [place], [place] [address]    [facility]  09/14/2022 0840    BOTTLES DRAWN AEROBIC AND ANAEROBIC Blood Culture adequate volume Performed at [facility], [address], [place], [place] [address]    CULT  09/14/2022 0840    NO GROWTH 5 DAYS Performed at [facility], [address] [place], [place] [address]    REPTSTATUS 09/19/2022 FINAL 09/14/2022 0840    Cardiac Enzymes: No results for input(s): CKTOTAL, CKMB, CKMBINDEX, TROPONINI in the last 168 hours. CBG: Recent Labs  Lab 03/27/24 1649 03/27/24 1745 03/27/24 2057 03/28/24 0736 03/28/24 1127  GLUCAP 160* 125* 188* 178* 297*   Iron  Studies: No results for input(s): IRON , TIBC, TRANSFERRIN, FERRITIN in the last 72 hours. Lab Results  Component Value Date   INR 1.9 (  H) 06/18/2023   INR 2.0 (H) 06/28/2022   INR 2.3 (H) 11/24/2021   Studies/Results: DG Chest 2 View Result Date: 03/28/2024 CLINICAL DATA:  Pacemaker placement. EXAM: CHEST - 2 VIEW COMPARISON:  03/22/2024 FINDINGS: Low volume lordotic frontal projection. The cardio pericardial silhouette is enlarged. Lead less pacemaker overlies the left heart, new in the interval. Vascular congestion without overt edema or focal airspace consolidation. No pneumothorax. Telemetry leads overlie the chest. IMPRESSION: Interval placement of leadless pacemaker. No pneumothorax. Electronically Signed   By: Camellia Candle M.D.   On: 03/28/2024 05:58   EP PPM/ICD IMPLANT Result Date: 03/27/2024  CONCLUSIONS:  1. Successful single chamber leadless pacemaker implant.  2. No early apparent complications. [name], MD, [facility], [facility] Cardiac Electrophysiology    Medications:  sodium chloride       acetaminophen   1,000 mg Oral Q8H   amiodarone   200 mg  Oral BID   apixaban   5 mg Oral BID   busPIRone   5 mg Oral BID   DULoxetine   60 mg Oral Daily   [START ON 03/29/2024] gabapentin   100 mg Oral Q T,Th,Sat-1800   insulin  aspart  0-6 Units Subcutaneous TID WC   lidocaine   3 patch Transdermal Q24H   midodrine   10 mg Oral BID   pantoprazole   40 mg Oral Daily   rosuvastatin   10 mg Oral QHS   sodium chloride  flush  3 mL Intravenous Q12H   sodium chloride  flush  3 mL Intravenous Q12H    Dialysis Orders: TTS SW 4h  B400  123.8kg  2K   AVF  Heparin  2500 Last full OP HD 7/31, post HD wt 127.5kg Comes off anywhere from 2-5kg over   Home bp meds: Metoprolol  25 bid Midodrine  10mg  bid  Assessment/Plan: Afib/ flutter: w/ borderline hypotension, Longstanding problem with failed multiple cardioversions. She has failed Tikosyn  in the past. HR 110-150. Pt was given IV amio and cardioverted by cardiology (hx of same). S/p leadless PPM on 8/7. Per cardiology note, to consider AV nodal ablation if she has further AF. 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 Last EF in Feb 25 showed EF 55-60%, indeterminate diastolic function, mildly reduced RV function ESRD: on HD TTS. No complications with HD. She is way above her EDW currently. Next HD 03/29/24  BP: getting her home metoprolol  and midodrine  as at home.  Volume: CXR clear, exam w/o gross edema, on RA, up 4-5kg at minimum. Max UF w/ HD. Anemia of esrd: 9-12 Hgb, follow. BMD: Corrected Ca 10.5 not on VDRA. Needs add on phos HLD: continue rosuvastatin . DM2: on SSI per primary  Charmaine Piety, NP [facility] 03/28/2024,1:26 PM  LOS: 2 days

## 2024-03-28 NOTE — Discharge Instructions (Signed)
After Your Leadless Pacemaker  . You have a Medtronic Pacemaker  . Do not squat, lift over 5 lbs, or have sexual activity for 1 week.   . Do not drive for 4 days, or until instructed by your healthcare provider that you are safe to do so.   . Monitor your surgical site for redness, swelling, and drainage. Call the device clinic at [phone] if you experience these symptoms or fever/chills.  . You may shower 1 day after your pacemaker implant and wash around the site with soap and water. Avoid lotions, ointments, or perfumes over your incision until it is well-healed.  . You may use a hot tub or a pool AFTER your wound check appointment if the incision is completely closed.  . Your Pacemaker may be MRI compatible. We will discuss this at your first follow up/wound check. .   . Remote monitoring is used to monitor your pacemaker from home. This monitoring is scheduled every 91 days by our office. It allows us to keep an eye on the functioning of your device to ensure it is working properly. You will routinely see your Electrophysiologist annually (more often if necessary).    Pacemaker Implantation, Care After This sheet gives you information about how to care for yourself after your procedure. Your health care provider may also give you more specific instructions. If you have problems or questions, contact your health care provider. What can I expect after the procedure? After the procedure, it is common to have:  Mild pain.  Slight bruising.  Some swelling over the incision.  You should received your Pacemaker ID card within 4-8 weeks. Follow these instructions at home: Medicines  Take over-the-counter and prescription medicines only as told by your health care provider.  If you were prescribed an antibiotic medicine, take it as told by your health care provider. Do not stop taking the antibiotic even if you start to feel better. Wound care   Check the incision site every  day to make sure it is not infected, bleeding, or starting to pull apart.  Do not use lotions or ointments near the incision site unless directed to do so.  Keep the incision area clean and dry for 7 days after the procedure or as directed by your health care provider. It takes several weeks for the incision site to completely heal.  Do not take baths, swim, or use a hot tub until seen in the device clinic.  Activity  Do not drive or use heavy machinery while taking prescription pain medicine.  Do not drive for 24 hours if you were given a medicine to help you relax (sedative).  Check with your health care provider before you start to drive or play sports.  You may go back to work when your health care provider says it is okay. Pacemaker care  You may be shown how to transfer data from your pacemaker through the phone to your health care provider.  Always let all health care providers know about your pacemaker before you have any medical procedures or tests.  Wear a medical ID bracelet or necklace stating that you have a pacemaker. Carry a pacemaker ID card with you at all times.  Your pacemaker battery will last for 5-15 years. Routine checks by your health care provider will let the health care provider know when the battery is starting to run down. The pacemaker will need to be replaced when the battery starts to run down.  Do not use   amateur radio equipment or electric welding torches. Other electrical devices are safe to use, including power tools, lawn mowers, and speakers. If you are unsure of whether something is safe to use, ask your health care provider.  When using your cell phone, hold it to the ear opposite the pacemaker. Do not leave your cell phone in a pocket over the pacemaker.  Avoid places or objects that have a strong electric or magnetic field, including: ? Airport security gates. When at the airport, let officials know that you have a pacemaker. ? Power  plants. ? Large electrical generators. ? Radiofrequency transmission towers, such as cell phone and radio towers. General instructions  Weigh yourself every day. If you suddenly gain weight, fluid may be building up in your body.  Keep all follow-up visits as told by your health care provider. This is important. Contact a health care provider if:  You gain weight suddenly.  Your legs or feet swell.  It feels like your heart is fluttering or skipping beats (heart palpitations).  You have chills or a fever.  You have more redness, swelling, or pain around your incisions.  You have more fluid or blood coming from your incisions.  Your incisions feel warm to the touch.  You have pus or a bad smell coming from your incisions. Get help right away if:  You have chest pain.  You have trouble breathing or are short of breath.  You become extremely tired.  You are light-headed or you faint. This information is not intended to replace advice given to you by your health care provider. Make sure you discuss any questions you have with your health care provider.    

## 2024-03-28 NOTE — Progress Notes (Addendum)
 PHARMACY - ANTICOAGULATION CONSULT NOTE  Pharmacy Consult for heparin  >> apixaban   Indication: atrial fibrillation  Allergies  Allergen Reactions   Bee Pollen Anaphylaxis and Other (See Comments)    UNCONFIRMED, but IS allergic to bee VENOM   Bee Venom Anaphylaxis   Sulfa Antibiotics Itching and Rash   Chlorhexidine      Unknown rxn.   Iodinated Contrast Media Nausea And Vomiting    Patient Measurements: Height: 5' 7 (170.2 cm) Weight: 128.6 kg (283 lb 8.2 oz) IBW/kg (Calculated) : 61.6 HEPARIN  DW (KG): 93.4  Vital Signs: Temp: 98.2 F (36.8 C) (08/08 0733) Temp Source: Oral (08/08 0733) BP: 138/88 (08/08 0922) Pulse Rate: 68 (08/08 0922)  Labs: Recent Labs    03/25/24 1424 03/26/24 0509 03/26/24 0509 03/27/24 0448 03/28/24 0507  HGB  --  9.6*   < > 10.0* 9.9*  HCT  --  29.8*  --  31.0* 30.3*  PLT  --  126*  --  149* 130*  APTT 101* 101*  --   --  65*  HEPARINUNFRC >1.10* 1.00*  --   --  0.47  CREATININE  --  8.33*  --  10.23* 7.17*   < > = values in this interval not displayed.    Estimated Creatinine Clearance: 10.6 mL/min (A) (by C-G formula based on SCr of 7.17 mg/dL (H)).   Assessment: 68 yo female with a PMH significant for HTN, HLD, DM, GERD, CAD, Afib, ESRD on HD, and diastolic HF. Patient was cardioverted on 8/2. Pharmacy was consulted to initiate heparin  for atrial fibrillation due to PPM procedure.  Patient was on apixaban , with the last dose being 8/3 AM. S/p leadless PPM placement. Heparin  level 0.47 is therapeutic on 1300 units/hr. CBC stable. Pharmacy consulted to restart apixaban  8/8.   Goal of Therapy:  Heparin  level 0.3-0.7 units/ml aPTT 66-102 seconds Monitor platelets by anticoagulation protocol: Yes   Plan:  Stop heparin  Start a pixaban 5mg  BID Monitor  signs/symptoms of bleeding   Jinnie Door, PharmD, BCPS, BCCP Clinical Pharmacist  Please check AMION for all [facility] Pharmacy phone numbers After 10:00 PM, call Main Pharmacy  [phone]

## 2024-03-29 ENCOUNTER — Other Ambulatory Visit (HOSPITAL_COMMUNITY): Payer: Self-pay

## 2024-03-29 ENCOUNTER — Inpatient Hospital Stay (HOSPITAL_COMMUNITY)

## 2024-03-29 DIAGNOSIS — I4892 Unspecified atrial flutter: Secondary | ICD-10-CM | POA: Diagnosis not present

## 2024-03-29 LAB — RENAL FUNCTION PANEL
Albumin: 3.6 g/dL (ref 3.5–5.0)
Anion gap: 16 — ABNORMAL HIGH (ref 5–15)
BUN: 55 mg/dL — ABNORMAL HIGH (ref 8–23)
CO2: 23 mmol/L (ref 22–32)
Calcium: 9.7 mg/dL (ref 8.9–10.3)
Chloride: 93 mmol/L — ABNORMAL LOW (ref 98–111)
Creatinine, Ser: 9.01 mg/dL — ABNORMAL HIGH (ref 0.44–1.00)
GFR, Estimated: 4 mL/min — ABNORMAL LOW (ref >60)
Glucose, Bld: 139 mg/dL — ABNORMAL HIGH (ref 70–99)
Phosphorus: 7.1 mg/dL — ABNORMAL HIGH (ref 2.5–4.6)
Potassium: 5 mmol/L (ref 3.5–5.1)
Sodium: 132 mmol/L — ABNORMAL LOW (ref 135–145)

## 2024-03-29 LAB — CBC WITH DIFFERENTIAL/PLATELET
Abs Immature Granulocytes: 0.03 K/uL (ref 0.00–0.07)
Basophils Absolute: 0 K/uL (ref 0.0–0.1)
Basophils Relative: 0 %
Eosinophils Absolute: 0 K/uL (ref 0.0–0.5)
Eosinophils Relative: 0 %
HCT: 29.1 % — ABNORMAL LOW (ref 36.0–46.0)
Hemoglobin: 9.4 g/dL — ABNORMAL LOW (ref 12.0–15.0)
Immature Granulocytes: 0 %
Lymphocytes Relative: 7 %
Lymphs Abs: 0.7 K/uL (ref 0.7–4.0)
MCH: 31 pg (ref 26.0–34.0)
MCHC: 32.3 g/dL (ref 30.0–36.0)
MCV: 96 fL (ref 80.0–100.0)
Monocytes Absolute: 1.3 K/uL — ABNORMAL HIGH (ref 0.1–1.0)
Monocytes Relative: 12 %
Neutro Abs: 8.3 K/uL — ABNORMAL HIGH (ref 1.7–7.7)
Neutrophils Relative %: 81 %
Platelets: 164 K/uL (ref 150–400)
RBC: 3.03 MIL/uL — ABNORMAL LOW (ref 3.87–5.11)
RDW: 15.3 % (ref 11.5–15.5)
WBC: 10.3 K/uL (ref 4.0–10.5)
nRBC: 0 % (ref 0.0–0.2)

## 2024-03-29 LAB — GLUCOSE, CAPILLARY
Glucose-Capillary: 138 mg/dL — ABNORMAL HIGH (ref 70–99)
Glucose-Capillary: 271 mg/dL — ABNORMAL HIGH (ref 70–99)
Glucose-Capillary: 154 mg/dL — ABNORMAL HIGH (ref 70–99)

## 2024-03-29 MED ORDER — ANTICOAGULANT SODIUM CITRATE 4% (200MG/5ML) IV SOLN: 5.0000 mL | Status: DC | PRN

## 2024-03-29 MED ORDER — ALBUMIN HUMAN 25 % IV SOLN
25.0000 g | Freq: Once | INTRAVENOUS | Status: AC | PRN
  Administered 2024-03-29: 25 g via INTRAVENOUS

## 2024-03-29 MED ORDER — MIDODRINE HCL 5 MG PO TABS: 10.0000 mg | ORAL_TABLET | Freq: Once | ORAL | Status: DC | PRN

## 2024-03-29 MED ORDER — HEPARIN SODIUM (PORCINE) 1000 UNIT/ML DIALYSIS
2500.0000 [IU] | INTRAMUSCULAR | Status: DC | PRN
  Administered 2024-03-29: 2500 [IU] via INTRAVENOUS_CENTRAL

## 2024-03-29 MED ORDER — HEPARIN SODIUM (PORCINE) 1000 UNIT/ML DIALYSIS: 1000.0000 [IU] | INTRAMUSCULAR | Status: DC | PRN

## 2024-03-29 MED ORDER — MIDODRINE HCL 5 MG PO TABS
ORAL_TABLET | ORAL | Status: AC
  Filled 2024-03-29: qty 2

## 2024-03-29 MED ORDER — OXYCODONE HCL 5 MG PO TABS
2.5000 mg | ORAL_TABLET | Freq: Four times a day (QID) | ORAL | 0 refills | Status: AC | PRN
  Filled 2024-03-29: qty 6, 3d supply, fill #0

## 2024-03-29 MED ORDER — LIDOCAINE HCL (PF) 1 % IJ SOLN: 5.0000 mL | INTRAMUSCULAR | Status: DC | PRN

## 2024-03-29 MED ORDER — ALBUMIN HUMAN 25 % IV SOLN
INTRAVENOUS | Status: AC
  Filled 2024-03-29: qty 100

## 2024-03-29 MED ORDER — DIPHENHYDRAMINE HCL 25 MG PO CAPS
ORAL_CAPSULE | ORAL | Status: AC
Start: 2024-03-29 — End: 2024-03-29
  Filled 2024-03-29: qty 1

## 2024-03-29 MED ORDER — AMIODARONE HCL 200 MG PO TABS
200.0000 mg | ORAL_TABLET | Freq: Two times a day (BID) | ORAL | 0 refills | Status: DC
Start: 2024-03-29 — End: 2024-04-25
  Filled 2024-03-29: qty 60, 30d supply, fill #0

## 2024-03-29 MED ORDER — ALTEPLASE 2 MG IJ SOLR: 2.0000 mg | Freq: Once | INTRAMUSCULAR | Status: DC | PRN

## 2024-03-29 MED ORDER — LIDOCAINE-PRILOCAINE 2.5-2.5 % EX CREA: 1.0000 | TOPICAL_CREAM | CUTANEOUS | Status: DC | PRN

## 2024-03-29 MED ORDER — PENTAFLUOROPROP-TETRAFLUOROETH EX AERO: 1.0000 | INHALATION_SPRAY | CUTANEOUS | Status: DC | PRN

## 2024-03-29 MED ORDER — GABAPENTIN 100 MG PO CAPS
100.0000 mg | ORAL_CAPSULE | ORAL | 0 refills | Status: DC
  Filled 2024-03-29: qty 12, 28d supply, fill #0

## 2024-03-29 MED ORDER — HEPARIN SODIUM (PORCINE) 1000 UNIT/ML IJ SOLN
INTRAMUSCULAR | Status: AC
  Filled 2024-03-29: qty 3

## 2024-03-29 NOTE — Progress Notes (Signed)
 Received patient in bed to unit.  Alert and oriented.  Informed consent signed and in chart.   TX duration:3 hours.  (Order changed to 3 hours per Charmaine NP  Patient tolerated well.  Transported back to the room  Alert, without acute distress.  Hand-off given to patient's nurse.   Access used: Left Fistula Upper [place]m Access issues: none  Total UF removed: 2L Medication(s) given: Albumin , midodrine , benedryl   03/29/24 1105  Vitals  Temp 98.1 F (36.7 C)  Temp Source Oral  BP (!) 119/109  Pulse Rate 73  ECG Heart Rate 75  Resp 18  Oxygen  Therapy  SpO2 98 %  O2 Device Room Air  During Treatment Monitoring  Duration of HD Treatment -hour(s) 3 hour(s)  HD Safety Checks Performed Yes  Intra-Hemodialysis Comments Tx completed  Post Treatment  Dialyzer Clearance Clear  Liters Processed 72  Fluid Removed (mL) 2000 mL  Tolerated HD Treatment Yes  Post-Hemodialysis Comments Benedryl for itchiness, Albumin  for BP support  AVG/AVF Arterial Site Held (minutes) 7 minutes  AVG/AVF Venous Site Held (minutes) 7 minutes  Fistula / Graft Left Upper arm  No placement date or time found.   Placed prior to admission: Yes  Orientation: Left  Access Location: Upper arm  Status Deaccessed     Camellia Brasil LPN [facility]

## 2024-03-29 NOTE — Plan of Care (Signed)
  Problem: Education: Goal: Ability to describe self-care measures that may prevent or decrease complications (Diabetes Survival Skills Education) will improve Outcome: Adequate for Discharge Goal: Individualized Educational Video(s) Outcome: Adequate for Discharge   Problem: Coping: Goal: Ability to adjust to condition or change in health will improve Outcome: Adequate for Discharge   Problem: Fluid Volume: Goal: Ability to maintain a balanced intake and output will improve Outcome: Adequate for Discharge   Problem: Health Behavior/Discharge Planning: Goal: Ability to identify and utilize available resources and services will improve Outcome: Adequate for Discharge Goal: Ability to manage health-related needs will improve Outcome: Adequate for Discharge   Problem: Metabolic: Goal: Ability to maintain appropriate glucose levels will improve Outcome: Adequate for Discharge   Problem: Nutritional: Goal: Maintenance of adequate nutrition will improve Outcome: Adequate for Discharge Goal: Progress toward achieving an optimal weight will improve Outcome: Adequate for Discharge   Problem: Skin Integrity: Goal: Risk for impaired skin integrity will decrease Outcome: Adequate for Discharge   Problem: Tissue Perfusion: Goal: Adequacy of tissue perfusion will improve Outcome: Adequate for Discharge   Problem: Education: Goal: Knowledge of General Education information will improve Description: Including pain rating scale, medication(s)/side effects and non-pharmacologic comfort measures Outcome: Adequate for Discharge   Problem: Health Behavior/Discharge Planning: Goal: Ability to manage health-related needs will improve Outcome: Adequate for Discharge   Problem: Clinical Measurements: Goal: Ability to maintain clinical measurements within normal limits will improve Outcome: Adequate for Discharge Goal: Will remain free from infection Outcome: Adequate for Discharge Goal:  Diagnostic test results will improve Outcome: Adequate for Discharge Goal: Respiratory complications will improve Outcome: Adequate for Discharge Goal: Cardiovascular complication will be avoided Outcome: Adequate for Discharge   Problem: Activity: Goal: Risk for activity intolerance will decrease Outcome: Adequate for Discharge   Problem: Nutrition: Goal: Adequate nutrition will be maintained Outcome: Adequate for Discharge   Problem: Coping: Goal: Level of anxiety will decrease Outcome: Adequate for Discharge   Problem: Elimination: Goal: Will not experience complications related to bowel motility Outcome: Adequate for Discharge Goal: Will not experience complications related to urinary retention Outcome: Adequate for Discharge   Problem: Pain Managment: Goal: General experience of comfort will improve and/or be controlled Outcome: Adequate for Discharge   Problem: Safety: Goal: Ability to remain free from injury will improve Outcome: Adequate for Discharge   Problem: Skin Integrity: Goal: Risk for impaired skin integrity will decrease Outcome: Adequate for Discharge   Problem: Education: Goal: Knowledge of disease or condition will improve Outcome: Adequate for Discharge Goal: Understanding of medication regimen will improve Outcome: Adequate for Discharge Goal: Individualized Educational Video(s) Outcome: Adequate for Discharge   Problem: Activity: Goal: Ability to tolerate increased activity will improve Outcome: Adequate for Discharge   Problem: Cardiac: Goal: Ability to achieve and maintain adequate cardiopulmonary perfusion will improve Outcome: Adequate for Discharge   Problem: Health Behavior/Discharge Planning: Goal: Ability to safely manage health-related needs after discharge will improve Outcome: Adequate for Discharge   Problem: Education: Goal: Knowledge of cardiac device and self-care will improve Outcome: Adequate for Discharge Goal:  Ability to safely manage health related needs after discharge will improve Outcome: Adequate for Discharge Goal: Individualized Educational Video(s) Outcome: Adequate for Discharge   Problem: Cardiac: Goal: Ability to achieve and maintain adequate cardiopulmonary perfusion will improve Outcome: Adequate for Discharge

## 2024-03-29 NOTE — Progress Notes (Addendum)
 [facility] Progress Note   Subjective:    Seen and examined patient on HD. Tolerating UFG 2L. BP is 90/67 and she's on Midodrine . She denies SOB, CP, and palpitations. She tells me that she may go home later today.  Objective Vitals:   03/29/24 0845 03/29/24 0900 03/29/24 0930 03/29/24 1000  BP: 90/67 96/61 (!) 99/54 (!) 111/51  Pulse: 73 71 67 75  Resp: 17 14 15 19   Temp:      TempSrc:      SpO2: 97% 98% 97% 98%  Weight:      Height:       Physical Exam General: Alert, awake, NAD Heart: RRR, no murmurs Lungs:Clear anteriorly Abdomen: Soft and non-tender Extremities:No LE edema Dialysis Access: L AVF (+) B/T   Filed Weights   03/27/24 1148 03/29/24 0745 03/29/24 0747  Weight: 128.6 kg 131.4 kg 131.4 kg    Intake/Output Summary (Last 24 hours) at 03/29/2024 1018 Last data filed at 03/29/2024 0700 Gross per 24 hour  Intake 480 ml  Output 0 ml  Net 480 ml    Additional Objective Labs: Basic Metabolic Panel: Recent Labs  Lab 03/27/24 0448 03/28/24 0507 03/29/24 0426  NA 133* 136 132*  K 4.4 5.2* 5.0  CL 92* 95* 93*  CO2 25 24 23   GLUCOSE 109* 170* 139*  BUN 49* 33* 55*  CREATININE 10.23* 7.17* 9.01*  CALCIUM  9.8 10.2 9.7  PHOS  --   --  7.1*   Liver Function Tests: Recent Labs  Lab 03/23/24 0336 03/29/24 0426  AST 20  --   ALT 11  --   ALKPHOS 78  --   BILITOT 0.8  --   PROT 6.8  --   ALBUMIN  3.1* 3.6   No results for input(s): LIPASE, AMYLASE in the last 168 hours. CBC: Recent Labs  Lab 03/25/24 0601 03/26/24 0509 03/27/24 0448 03/28/24 0507 03/29/24 0426  WBC 6.6 5.8 5.8 6.9 10.3  NEUTROABS  --   --   --   --  8.3*  HGB 9.9* 9.6* 10.0* 9.9* 9.4*  HCT 30.8* 29.8* 31.0* 30.3* 29.1*  MCV 97.2 97.7 96.6 96.5 96.0  PLT 125* 126* 149* 130* 164   Blood Culture    Component Value Date/Time   SDES  09/14/2022 0840    BLOOD RIGHT HAND Performed at [facility], [address] [place], [place]  [address]    [facility]  09/14/2022 0840    BOTTLES DRAWN AEROBIC AND ANAEROBIC Blood Culture adequate volume Performed at [facility], [address], [place], [place] [address]    CULT  09/14/2022 0840    NO GROWTH 5 DAYS Performed at [facility], [address] [place], [place] [address]    REPTSTATUS 09/19/2022 FINAL 09/14/2022 0840    Cardiac Enzymes: No results for input(s): CKTOTAL, CKMB, CKMBINDEX, TROPONINI in the last 168 hours. CBG: Recent Labs  Lab 03/28/24 0736 03/28/24 1127 03/28/24 1540 03/28/24 2100 03/29/24 0722  GLUCAP 178* 297* 152* 155* 138*   Iron  Studies: No results for input(s): IRON , TIBC, TRANSFERRIN, FERRITIN in the last 72 hours. Lab Results  Component Value Date   INR 1.9 (H) 06/18/2023   INR 2.0 (H) 06/28/2022   INR 2.3 (H) 11/24/2021   Studies/Results: CT LUMBAR SPINE WO CONTRAST Result Date: 03/28/2024 CLINICAL DATA:  Fall, back pain, right lower extremity weakness and numbness EXAM: CT LUMBAR SPINE WITHOUT CONTRAST TECHNIQUE: Multidetector CT imaging of the lumbar spine was performed  without intravenous contrast administration. Multiplanar CT image reconstructions were also generated. RADIATION DOSE REDUCTION: This exam was performed according to the departmental dose-optimization program which includes automated exposure control, adjustment of the mA and/or kV according to patient size and/or use of iterative reconstruction technique. COMPARISON:  None Available. FINDINGS: Segmentation: 5 lumbar type vertebrae. Alignment: Normal. Vertebrae: Probable slight compression through the superior endplate of L4. This is new since prior CT from 06/18/2023, but appears similar to prior plain films from 03/22/2024. Paraspinal and other soft tissues: Negative paraspinal soft tissues. 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 Early degenerative spurring. Moderate  degenerative facet disease most pronounced in the lower lumbar spine. No focal disc herniation. IMPRESSION: Suspect slight compression fracture through the superior endplate of L4, new since 06/18/2023, but appears similar to prior plain films from 03/22/2024. Electronically Signed   By: Franky Crease M.D.   On: 03/28/2024 16:18   DG Chest 2 View Result Date: 03/28/2024 CLINICAL DATA:  Pacemaker placement. EXAM: CHEST - 2 VIEW COMPARISON:  03/22/2024 FINDINGS: Low volume lordotic frontal projection. The cardio pericardial silhouette is enlarged. Lead less pacemaker overlies the left heart, new in the interval. Vascular congestion without overt edema or focal airspace consolidation. No pneumothorax. Telemetry leads overlie the chest. IMPRESSION: Interval placement of leadless pacemaker. No pneumothorax. Electronically Signed   By: Camellia Candle M.D.   On: 03/28/2024 05:58   EP PPM/ICD IMPLANT Result Date: 03/27/2024  CONCLUSIONS:  1. Successful single chamber leadless pacemaker implant.  2. No early apparent complications. [name], MD, [facility], [facility] Cardiac Electrophysiology    Medications:  anticoagulant sodium citrate       acetaminophen   1,000 mg Oral Q8H   amiodarone   200 mg Oral BID   apixaban   5 mg Oral BID   busPIRone   5 mg Oral BID   DULoxetine   60 mg Oral Daily   gabapentin   100 mg Oral Q T,Th,Sat-1800   insulin  aspart  0-6 Units Subcutaneous TID WC   lidocaine   3 patch Transdermal Q24H   midodrine   10 mg Oral BID   pantoprazole   40 mg Oral Daily   rosuvastatin   10 mg Oral QHS   sodium chloride  flush  3 mL Intravenous Q12H   sodium chloride  flush  3 mL Intravenous Q12H    Dialysis Orders: TTS SW 4h  B400  123.8kg  2K   AVF  Heparin  2500 Last full OP HD 7/31, post HD wt 127.5kg Comes off anywhere from 2-5kg over    Home bp meds: Metoprolol  25 bid Midodrine  10mg  bid  Assessment/Plan: Afib/ flutter: w/ borderline hypotension, Longstanding problem with failed multiple  cardioversions. She has failed Tikosyn  in the past. HR 110-150. Pt was given IV amio and cardioverted by cardiology (hx of same). S/p leadless PPM on 8/7. Per cardiology note, to consider AV nodal ablation if she has further AF. 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 Last EF in Feb 25 showed EF 55-60%, indeterminate diastolic function, mildly reduced RV function ESRD: on HD TTS. No complications with HD. She is way above her EDW currently. On HD.  BP: getting her home metoprolol  and midodrine  as at home.  Volume: CXR clear, exam w/o gross edema, on RA, up 4-5kg at minimum. Max UF w/ HD. Anemia of esrd: 9-12 Hgb, follow. BMD: Corrected Ca 10.5 not on VDRA. Needs add on phos HLD: continue rosuvastatin . DM2: on SSI  per primary Dispo - She tells me that she may go home later today. Okay for discharge from a renal standpoint but needs to be cleared with the other disciplines.  Charmaine Piety, NP [facility] 03/29/2024,10:18 AM  LOS: 3 days

## 2024-03-29 NOTE — Plan of Care (Signed)
  Problem: Education: Goal: Knowledge of cardiac device and self-care will improve Outcome: Progressing Goal: Ability to safely manage health related needs after discharge will improve Outcome: Progressing   Problem: Cardiac: Goal: Ability to achieve and maintain adequate cardiopulmonary perfusion will improve Outcome: Progressing   

## 2024-03-29 NOTE — Discharge Summary (Signed)
 Physician Discharge Summary  [name] FMW:[id number] DOB: 07-11-1956 DOA: 03/22/2024  PCP: Celestia Rosaline SQUIBB, NP  Admit date: 03/22/2024 Discharge date: 03/29/2024  Time spent: 40 minutes  Recommendations for Outpatient Follow-up:  Follow outpatient CBC/CMP  Follow with cardiology outpatient  Follow back pain outpatient, seems to be improving, discharging on gabapentin /oxy - additional w/u outpatient as needed - unable to get MRI with recent pacemaker placement  Discharge Diagnoses:  Active Problems:   ESRD (end stage renal disease) (HCC)   GERD (gastroesophageal reflux disease)   OSA on CPAP   COPD (chronic obstructive pulmonary disease) (HCC)   Class 3 obesity   Type 2 diabetes mellitus with hyperlipidemia (HCC)   S/P mitral valve repair   S/P tricuspid valve repair   Chronic diastolic CHF (congestive heart failure) (HCC)   QT prolongation   Atrial flutter with rapid ventricular response (HCC)   Anxiety and depression   Chronic deep vein thrombosis (DVT) of other vein of right upper extremity (HCC)   HLD (hyperlipidemia)   CAD in native artery   Persistent atrial fibrillation (HCC)   Atrial fibrillation with RVR (HCC)   Discharge Condition: stable  Diet recommendation: heart healthy  Filed Weights   03/29/24 0745 03/29/24 0747 03/29/24 1120  Weight: 131.4 kg 131.4 kg 129.1 kg    History of present illness:   [name] is [name] 68 y.o. female with medical history significant of hypertension, hypertension, hyperlipidemia, diabetes, GERD, CAD, atrial fibrillation, chronic diastolic CHF, anxiety, depression, ESRD on HD, OSA on CPAP, obesity, COPD, QT prolongation, status post mitral valve and tricuspid valve repair, DVT presented with shortness of breath and tachycardia. Admitted to [facility] service for persistent Afib, Aflutter.   EP consulted. She's now s/p pacemaker placement.  Amiodarone  adjusted.   Plan is for ablation when she recurs.   Stable for  discharge 8/9.    See below for additional details.  Hospital Course:  Assessment and Plan:  Atrial Fibrillation Flutter with RVR Per cards, appreciate assistance - failed AADs and DCCS in past despite amio.   Now s/p leadless pacemaker implant amiodarone  per cardiology, planning for ablation when she recurs   Back Pain  Since fall prior to admission (she fell off [employer]raven chair onto her back/bottom) Plain films without acute fx or subluxation - she notes numbness and RLE weakness (strength almost symmetric on exam, maybe mild weakness noted).  No saddle anesthesia.  Unfortunately, has to wait 6 weeks for MRI with pacemaker placement.   CT with slight compression fracture through superior endplate of L4 R hip plain films no acute or healing fracture of the pelvis or R hip, mild hip OA She's ambulating at her baseline, which is reassuring   ESRD Volume per renal    Hypotension Continue home midodrine    Chronic diastolic CHF Status post tricuspid valve repair and mitral valve repair Last echo in February 2025 with EF 55-60%, indeterminate diastolic function, mildly reduced RV function.  Volume management with hemodialysis   Hyperlipidemia Continue rosuvastatin    Diabetes A1C 5  On SSI at home, follow outpatient   GERD Continue PPI.   Anxiety Depression Continue BuSpar , duloxetine    OSA Continue home CPAP Needs outpatient sleep study  COPD Continue home PRN albuterol    QTc prolongation Initial EKG with QTc of 527.  On repeat EKG QTc improved to 477. Caution with QT prolonging meds   History of DVT Heparin  drip for now, transition to Eliquis  upon discharge.   Obesity, Class IV  Body mass index is 44.58 kg/m.    Procedures: Leadless Pacemaker Insertion  CONCLUSIONS:   1. Successful single chamber leadless pacemaker implant.   2. No early apparent complications.    Consultations: Cardiology Renal  Discharge Exam: Vitals:   03/29/24 1134 03/29/24 1200   BP: (!) 90/55 (!) 104/57  Pulse: 78 78  Resp: 16   Temp: 98.5 F (36.9 C)   SpO2: 100% 99%   Back pain steady  Numbness in RLE better, strength better too  General: No acute distress. Cardiovascular: RRR Lungs: unlabored Neurological: Alert and oriented 3. Again no significant difference noted in LE strength bilaterally.  She reports that she's ambulating at her baseline. Skin: Warm and dry. No rashes or lesions. Extremities: No clubbing or cyanosis. No edema.   Discharge Instructions   Discharge Instructions     Call MD for:  difficulty breathing, headache or visual disturbances   Complete by: As directed    Call MD for:  extreme fatigue   Complete by: As directed    Call MD for:  hives   Complete by: As directed    Call MD for:  persistant dizziness or light-headedness   Complete by: As directed    Call MD for:  persistant nausea and vomiting   Complete by: As directed    Call MD for:  redness, tenderness, or signs of infection (pain, swelling, redness, odor or green/yellow discharge around incision site)   Complete by: As directed    Call MD for:  severe uncontrolled pain   Complete by: As directed    Call MD for:  temperature >100.4   Complete by: As directed    Diet - low sodium heart healthy   Complete by: As directed    Discharge instructions   Complete by: As directed    You were seen for issues related to atrial fib.  You've now had Damont Balles pacemaker placed.  Your amiodarone  has been increased.  Cardiology will follow up with you as an outpatient and consider an ablation.  You also complained of back pain.  Your lumbar CT shows Donavan Kerlin new compression fracture.  You also have some nerve symptoms in your right leg.  We can't get an MRI due to your recent pacemaker placement, but your ability to walk at your baseline is reassuring.  If you have worsening pain, numbness, or weakness, return to care.  We'll start you on gabapentin  and low dose oxycodone  for Lorea Kupfer few days.  Also  use tylenol  as needed for pain.  You can continue your sliding scale insulin  at home.  If you have low blood sugars, discuss your regimen with your outpatient doctor.   Return for new, recurrent, or worsening symptoms.  Please ask your PCP to request records from this hospitalization so they know what was done and what the next steps will be.   Increase activity slowly   Complete by: As directed       Allergies as of 03/29/2024       Reactions   Bee Pollen Anaphylaxis, Other (See Comments)   UNCONFIRMED, but IS allergic to bee VENOM   Bee Venom Anaphylaxis   Sulfa Antibiotics Itching, Rash   Chlorhexidine     Unknown rxn.   Iodinated Contrast Media Nausea And Vomiting        Medication List     STOP taking these medications    Lantus  SoloStar 100 UNIT/ML Solostar Pen Generic drug: insulin  glargine   metoprolol  tartrate 25 MG tablet Commonly  known as: LOPRESSOR        TAKE these medications    Accu-Chek Guide Test test strip Generic drug: glucose blood Use to check blood sugar 3 times daily.   Accu-Chek Guide w/Device Kit Use to check blood sugar 3 times daily.   Accu-Chek Softclix Lancets lancets Use to check blood sugar 3 times daily.   acetaminophen  325 MG tablet Commonly known as: TYLENOL  Take 2 tablets (650 mg total) by mouth every 6 (six) hours as needed for mild pain or moderate pain.   albuterol  108 (90 Base) MCG/ACT inhaler Commonly known as: Proventil  HFA Inhale 1-2 puffs into the lungs every 6 (six) hours as needed for wheezing or shortness of breath.   amiodarone  200 MG tablet Commonly known as: PACERONE  Take 1 tablet (200 mg total) by mouth 2 (two) times daily. Follow with cardiology for refills What changed:  when to take this additional instructions   Auryxia  1 GM 210 MG(Fe) tablet Generic drug: ferric citrate  Take 210 mg by mouth 3 (three) times daily.   busPIRone  5 MG tablet Commonly known as: BUSPAR  TAKE ONE TABLET BY MOUTH TWICE  DAILY @9AM -5PM   diphenhydrAMINE  25 MG tablet Commonly known as: BENADRYL  Take 25 mg by mouth every 6 (six) hours as needed for itching. What changed: when to take this   doxercalciferol  4 MCG/2ML injection Commonly known as: HECTOROL  Inject 1.5 mLs (3 mcg total) into the vein Every Tuesday,Thursday,and Saturday with dialysis.   DULoxetine  60 MG capsule Commonly known as: CYMBALTA  TAKE ONE CAPSULE (60MG  TOTAL) BY MOUTH DAILY AT 9AM   Eliquis  5 MG Tabs tablet Generic drug: apixaban  TAKE ONE TABLET (5MG  TOTAL) BY MOUTH TWICE DAILY @9AM -5PM   EPINEPHrine  0.3 mg/0.3 mL Soaj injection Commonly known as: EPI-PEN Inject 0.3 mg into the muscle as needed for anaphylaxis.   Flonase  Allergy Relief 50 MCG/ACT nasal spray Generic drug: fluticasone  Place 2 sprays into both nostrils daily as needed for allergies. What changed: when to take this   gabapentin  100 MG capsule Commonly known as: NEURONTIN  Take 1 capsule (100 mg total) by mouth every Tuesday, Thursday, and Saturday at 6 PM.   insulin  lispro 100 UNIT/ML KwikPen Commonly known as: HumaLOG  KwikPen For blood sugars 0-150 give 0 units of insulin , 151-200 give 2 units of insulin , 201-250 give 4 units, 251-300 give 6 units, 301-350 give 8 units, 351-400 give 10 units,> 400 give 12 units and call M.D.   lidocaine -prilocaine  cream Commonly known as: EMLA  Apply 1 Application topically Every Tuesday,Thursday,and Saturday with dialysis.   loratadine  10 MG tablet Commonly known as: CLARITIN  Take 1 tablet (10 mg total) by mouth every morning.   metoCLOPramide  5 MG tablet Commonly known as: Reglan  Take 1 tablet (5 mg total) by mouth every 8 (eight) hours as needed for nausea.   midodrine  10 MG tablet Commonly known as: PROAMATINE  Take 1 tablet (10 mg total) by mouth 2 (two) times daily.   MiraLax  17 GM/SCOOP powder Generic drug: polyethylene glycol powder Take 17 g by mouth daily as needed (constipation.).   MIRCERA IJ 50 mcg.    MULTIVITAMIN WOMEN PO Take 1 tablet by mouth daily.   nitroGLYCERIN  0.4 MG SL tablet Commonly known as: NITROSTAT  Place 1 tablet (0.4 mg total) under the tongue every 5 (five) minutes x 3 doses as needed for chest pain.   oxyCODONE  5 MG immediate release tablet Commonly known as: Oxy IR/ROXICODONE  Take 0.5 tablets (2.5 mg total) by mouth every 6 (six) hours as needed for  up to 3 days for moderate pain (pain score 4-6).   pantoprazole  40 MG tablet Commonly known as: PROTONIX  Take 1 tablet (40 mg total) by mouth daily.   rosuvastatin  10 MG tablet Commonly known as: CRESTOR  Take 10 mg by mouth at bedtime.               Durable Medical Equipment  (From admission, onward)           Start     Ordered   03/24/24 1327  For home use only DME Shower stool  Once       Comments: Shower chair   03/24/24 1327           Allergies  Allergen Reactions   Bee Pollen Anaphylaxis and Other (See Comments)    UNCONFIRMED, but IS allergic to bee VENOM   Bee Venom Anaphylaxis   Sulfa Antibiotics Itching and Rash   Chlorhexidine      Unknown rxn.   Iodinated Contrast Media Nausea And Vomiting    Follow-up Information     [facility] Follow up.   Why: Shower Chair-office to deliver to the room Contact information: Address: [address], [place], [place] [address] Phone: [phone]        Celestia Rosaline SQUIBB, NP Follow up.   Specialty: Internal Medicine Contact information: [address] [place] [address] [phone]                  The results of significant diagnostics from this hospitalization (including imaging, microbiology, ancillary and laboratory) are listed below for reference.    Significant Diagnostic Studies: CT LUMBAR SPINE WO CONTRAST Result Date: 03/28/2024 CLINICAL DATA:  Fall, back pain, right lower extremity weakness and numbness EXAM: CT LUMBAR SPINE WITHOUT CONTRAST TECHNIQUE: Multidetector CT imaging of the  lumbar spine was performed without intravenous contrast administration. Multiplanar CT image reconstructions were also generated. RADIATION DOSE REDUCTION: This exam was performed according to the departmental dose-optimization program which includes automated exposure control, adjustment of the mA and/or kV according to patient size and/or use of iterative reconstruction technique. COMPARISON:  None Available. FINDINGS: Segmentation: 5 lumbar type vertebrae. Alignment: Normal. Vertebrae: Probable slight compression through the superior endplate of L4. This is new since prior CT from 06/18/2023, but appears similar to prior plain films from 03/22/2024. Paraspinal and other soft tissues: Negative paraspinal soft tissues. 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 Early degenerative spurring. Moderate degenerative facet disease most pronounced in the lower lumbar spine. No focal disc herniation. IMPRESSION: Suspect slight compression fracture through the superior endplate of L4, new since 06/18/2023, but appears similar to prior plain films from 03/22/2024. Electronically Signed   By: Franky Crease M.D.   On: 03/28/2024 16:18   DG Chest 2 View Result Date: 03/28/2024 CLINICAL DATA:  Pacemaker placement. EXAM: CHEST - 2 VIEW COMPARISON:  03/22/2024 FINDINGS: Low volume lordotic frontal projection. The cardio pericardial silhouette is enlarged. Lead less pacemaker overlies the left heart, new in the interval. Vascular congestion without overt edema or focal airspace consolidation. No pneumothorax. Telemetry leads overlie the chest. IMPRESSION: Interval placement of leadless pacemaker. No pneumothorax. Electronically Signed   By: Camellia Candle M.D.   On: 03/28/2024 05:58   EP PPM/ICD IMPLANT Result Date: 03/27/2024  CONCLUSIONS:  1. Successful single chamber leadless pacemaker implant.  2. No early apparent complications. [name], MD, [facility], [facility] Cardiac  Electrophysiology   DG Lumbar Spine 2-3  Views Result Date: 03/22/2024 EXAM: 2 or 3 VIEW(S) XRAY OF THE LUMBAR SPINE 03/22/2024 11:57:00 AM COMPARISON: CT lumbar spine 06/18/2023 CLINICAL HISTORY: Fall. Reason for exam: fall SOB. FINDINGS: LUMBAR SPINE: BONES: No signs of lumbar spine fracture or subluxation. Compression deformity involving the T11 vertebra. DISCS AND DEGENERATIVE CHANGES: Mild lumbar degenerative disc disease and facet arthropathy. SOFT TISSUES: Cholecystectomy clips noted in the left upper quadrant of the abdomen. 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 IMPRESSION: 1. No acute lumbar spine fracture or subluxation. 2. Compression deformity involving the T11 vertebra, similar to prior exam. 3. Mild lumbar degenerative disc disease and facet arthropathy. Electronically signed by: Waddell Calk MD 03/22/2024 12:05 PM EDT RP Workstation: [id number]   DG Chest Portable 1 View Result Date: 03/22/2024 CLINICAL DATA:  Shortness of breath. EXAM: PORTABLE CHEST 1 VIEW COMPARISON:  03/03/2024 FINDINGS: Lungs are adequately inflated without focal airspace consolidation or effusion. No pneumothorax. Mild stable cardiomegaly. Remainder of the exam is unchanged. IMPRESSION: 1. No acute cardiopulmonary disease. 2. Mild stable cardiomegaly. Electronically Signed   By: Toribio Agreste M.D.   On: 03/22/2024 10:06   DG Chest 2 View Result Date: 03/03/2024 CLINICAL DATA:  Worsening chest pain and shortness of breath. EXAM: CHEST - 2 VIEW COMPARISON:  Cardiac CT 12/26/2023. Chest radiographs 12/24/2023 and 02/01/2024. FINDINGS: Stable cardiomegaly post median sternotomy, dual valve replacement and left atrial appendage closure. The lungs are clear. No pleural effusion or pneumothorax. The bones appear unremarkable. IMPRESSION: Stable cardiomegaly without evidence of acute cardiopulmonary process. Electronically Signed   By: Elsie Perone M.D.   On: 03/03/2024 15:41     Microbiology: No results found for this or any previous visit (from the past 240 hours).   Labs: Basic Metabolic Panel: Recent Labs  Lab 03/23/24 0336 03/26/24 0509 03/27/24 0448 03/28/24 0507 03/29/24 0426  NA 137 134* 133* 136 132*  K 4.1 4.2 4.4 5.2* 5.0  CL 95* 92* 92* 95* 93*  CO2 27 26 25 24 23   GLUCOSE 150* 112* 109* 170* 139*  BUN 41* 36* 49* 33* 55*  CREATININE 9.48* 8.33* 10.23* 7.17* 9.01*  CALCIUM  9.2 9.8 9.8 10.2 9.7  PHOS  --   --   --   --  7.1*   Liver Function Tests: Recent Labs  Lab 03/23/24 0336 03/29/24 0426  AST 20  --   ALT 11  --   ALKPHOS 78  --   BILITOT 0.8  --   PROT 6.8  --   ALBUMIN  3.1* 3.6   No results for input(s): LIPASE, AMYLASE in the last 168 hours. No results for input(s): AMMONIA in the last 168 hours. CBC: Recent Labs  Lab 03/25/24 0601 03/26/24 0509 03/27/24 0448 03/28/24 0507 03/29/24 0426  WBC 6.6 5.8 5.8 6.9 10.3  NEUTROABS  --   --   --   --  8.3*  HGB 9.9* 9.6* 10.0* 9.9* 9.4*  HCT 30.8* 29.8* 31.0* 30.3* 29.1*  MCV 97.2 97.7 96.6 96.5 96.0  PLT 125* 126* 149* 130* 164   Cardiac Enzymes: No results for input(s): CKTOTAL, CKMB, CKMBINDEX, TROPONINI in the last 168 hours. BNP: BNP (last 3 results) Recent Labs    02/01/24 1549 03/03/24 1651 03/22/24 0954  BNP 131.9* 253.5* 211.5*    ProBNP (last 3 results) Recent Labs    12/24/23 1004  PROBNP 8,609.0*    CBG: Recent Labs  Lab 03/28/24 1127 03/28/24 1540 03/28/24 2100 03/29/24 0722 03/29/24 1132  GLUCAP 297* 152* 155* 138* 271*       Signed:  Meliton Monte MD.  [facility] 03/29/2024, 12:26 PM

## 2024-03-30 NOTE — Discharge Planning (Signed)
 [facility] Patient Discharge Orders- [facility] CLINIC: [facility]  Patient's name: [name] Admit/DC Dates: 03/22/2024 - 03/29/2024  Discharge Diagnoses: Afib/A-flutter with RVR: Failed AADs and DCCS in the past despite amio. S/p leadless PPM 8/7. Per Cardiology, may need to proceed with ablation if arrhthymia re-occurs  Chronic diastolic CHF: Last echo in February 2025 with EF 55-60%, indeterminate diastolic function, mildly reduced RV function.   Aranesp : Given: No    Last Hgb: 9.4 PRBC's Given: No  ESA dose for discharge: mircera 75 mcg IV q 2 weeks   Heparin  change: No  EDW Change: No  Bath Change: No  Access intervention/Change: No  Hectorol  change: No  Discharge Labs: Calcium  9.7  Phosphorus 7.1  Albumin  3.6  K+ 5.0 (pre-HD)  IV Antibiotics: No  On Coumadin ?: No. Eliquis  5mg  BID   OTHER/APPTS/LAB ORDERS:    D/C Meds to be reconciled by nurse after every discharge.  Completed By: Charmaine Piety, NP   Reviewed by: MD:______ RN_______

## 2024-03-30 NOTE — TOC Transition Note (Signed)
 Transition of Care - Initial Contact after Hospitalization  Date of discharge: 03/29/2024  Date of contact: 03/30/24  Method: Phone Spoke to: Patient  Patient contacted to discuss transition of care from recent inpatient hospitalization. Patient was admitted to [facility] from 03/22/24 to 03/29/24 discharge diagnosis of Afib/A-flutter with RVR. S/p leadless PPM placement 8/7.  The discharge medication list was reviewed. Patient understands the changes and has no concerns.   Patient will return to her outpatient HD unit on: 04/01/24 at [facility].  Charmaine Piety, NP

## 2024-03-31 ENCOUNTER — Telehealth: Payer: Self-pay | Admitting: *Deleted

## 2024-03-31 NOTE — Transitions of Care (Post Inpatient/ED Visit) (Signed)
 03/31/2024  Name: [name] MRN: [id number] DOB: 01-04-1956  Today's TOC FU Call Status: Today's TOC FU Call Status:: Successful TOC FU Call Completed TOC FU Call Complete Date: 03/31/24 Patient's Name and Date of Birth confirmed.  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 How have you been since you were released from the hospital?: Better Any questions or concerns?: No  Items Reviewed: Did you receive and understand the discharge instructions provided?: Yes Medications obtained,verified, and reconciled?: Yes (Medications Reviewed) Any new allergies since your discharge?: No Dietary orders reviewed?: No Do you have support at home?: Yes People in Home [RPT]: other relative(s) Name of Support/Comfort Primary Source: samaiya  Medications Reviewed Today: Medications Reviewed Today     Reviewed by Kennieth Cathlean DEL, RN (Case Manager) on 03/31/24 at 1353  Med List Status: <None>   Medication Order Taking? Sig Documenting Provider Last Dose Status Informant  Accu-Chek Softclix Lancets lancets [id number] Yes Use to check blood sugar 3 times daily. Newlin, Enobong, MD  Active Self, Pharmacy Records  acetaminophen  (TYLENOL ) 325 MG tablet [id number] Yes Take 2 tablets (650 mg total) by mouth every 6 (six) hours as needed for mild pain or moderate pain. Arrien, Elidia Sieving, MD  Active Self, Pharmacy Records           Med Note (LEE, NICOLE   Sat Nov 10, 2023  2:12 AM)    albuterol  (PROVENTIL  HFA) 108 [phone] Base) MCG/ACT inhaler [id number] Yes Inhale 1-2 puffs into the lungs every 6 (six) hours as needed for wheezing or shortness of breath. Celestia Rosaline SQUIBB, NP  Active Self, Pharmacy Records           Med Note (LEE, NICOLE   Sat Nov 10, 2023  2:12 AM)    amiodarone  (PACERONE ) 200 MG  tablet [id number] Yes Take 1 tablet (200 mg total) by mouth 2 (two) times daily. Follow with cardiology for refills Perri DELENA Meliton Mickey., MD  Active   AURYXIA  1 GM 210 MG(Fe) tablet [id number] Yes Take 210 mg by mouth 3 (three) times daily. [provider]  Active Self, Pharmacy Records  Blood Glucose Monitoring Suppl (ACCU-CHEK GUIDE) w/Device KIT [id number] Yes Use to check blood sugar 3 times daily. Newlin, Enobong, MD  Active Self, Pharmacy Records  busPIRone  (BUSPAR ) 5 MG tablet [id number] Yes TAKE ONE TABLET BY MOUTH TWICE DAILY @9AM -5PM Celestia Rosaline SQUIBB, NP  Active Self, Pharmacy Records  diphenhydrAMINE  (BENADRYL ) 25 MG tablet [id number] Yes Take 25 mg by mouth every 6 (six) hours as needed for itching.  Patient taking differently: Take 25 mg by mouth as needed for itching.   [provider]  Active Self, Pharmacy Records  doxercalciferol  (HECTOROL ) 4 MCG/2ML injection [id number] Yes Inject 1.5 mLs (3 mcg total) into the vein Every Tuesday,Thursday,and Saturday with dialysis. Caleen Burgess BROCKS, MD  Active Self, Pharmacy Records           Med Note EVERETTE JESUSA BROCKS   Dju Mar 22, 2024 11:23 AM) Was given at dialysis today 8.2.2025  DULoxetine  (CYMBALTA ) 60 MG capsule [id number] Yes TAKE ONE CAPSULE (60MG  TOTAL) BY MOUTH DAILY AT [facility] Celestia Rosaline SQUIBB, NP  Active Self, Pharmacy Records  ELIQUIS  5 MG TABS tablet [id number] Yes TAKE ONE TABLET (5MG  TOTAL) BY MOUTH TWICE DAILY @9AM -5PM Bensimhon, Sieving SAUNDERS, MD  Active Self, Pharmacy Records  EPINEPHrine  0.3 mg/0.3 mL  IJ SOAJ injection [id number] Yes Inject 0.3 mg into the muscle as needed for anaphylaxis. Celestia Rosaline SQUIBB, NP  Active Self, Pharmacy Records           Med Note RANELLE, KEENE M   Fri Nov 23, 2023 10:18 AM)    fluticasone  (FLONASE  ALLERGY RELIEF) 50 MCG/ACT nasal spray [id number] Yes Place 2 sprays into both nostrils daily as needed for allergies.  Patient taking differently: Place 2 sprays into both nostrils as needed  for allergies.   [provider]  Active Self, Pharmacy Records  gabapentin  (NEURONTIN ) 100 MG capsule [id number] Yes Take 1 capsule (100 mg total) by mouth every Tuesday, Thursday, and Saturday at 6 PM. Perri, DELENA Meliton Raddle., MD  Active   glucose blood (ACCU-CHEK GUIDE TEST) test strip [id number] Yes Use to check blood sugar 3 times daily. Newlin, Enobong, MD  Active Self, Pharmacy Records  insulin  lispro (HUMALOG  KWIKPEN) 100 UNIT/ML KwikPen [id number] Yes For blood sugars 0-150 give 0 units of insulin , 151-200 give 2 units of insulin , 201-250 give 4 units, 251-300 give 6 units, 301-350 give 8 units, 351-400 give 10 units,> 400 give 12 units and call M.D. Newlin, Enobong, MD  Active Self, Pharmacy Records           Med Note EVERETTE BRISKER C   Sat Mar 22, 2024 11:25 AM) Last dose 8.1.2025 pt not sure of how many units  lidocaine -prilocaine  (EMLA ) cream [id number] Yes Apply 1 Application topically Every Tuesday,Thursday,and Saturday with dialysis. [provider]  Active Self, Pharmacy Records  loratadine  (CLARITIN ) 10 MG tablet [id number] Yes Take 1 tablet (10 mg total) by mouth every morning. Celestia Rosaline SQUIBB, NP  Active Self, Pharmacy Records  Methoxy PEG-Epoetin  Beta [facility] IJ) [id number] Yes 50 mcg. [provider]  Active Self, Pharmacy Records           Med Note EVERETTE BRISKER BROCKS   Dju Mar 22, 2024 11:27 AM) Not sure of when it was last injected  metoCLOPramide  (REGLAN ) 5 MG tablet [id number] Yes Take 1 tablet (5 mg total) by mouth every 8 (eight) hours as needed for nausea. Celestia Rosaline SQUIBB, NP  Active Self, Pharmacy Records  midodrine  (PROAMATINE ) 10 MG tablet [id number] Yes Take 1 tablet (10 mg total) by mouth 2 (two) times daily. Caleen Burgess BROCKS, MD  Active Self, Pharmacy Records  Multiple Vitamins-Minerals (MULTIVITAMIN WOMEN PO) [id number] Yes Take 1 tablet by mouth daily. [provider]  Active Self, Pharmacy Records  nitroGLYCERIN  (NITROSTAT ) 0.4 MG SL  tablet [id number] Yes Place 1 tablet (0.4 mg total) under the tongue every 5 (five) minutes x 3 doses as needed for chest pain. Lonni Slain, MD  Active Self, Pharmacy Records  oxyCODONE  (OXY IR/ROXICODONE ) 5 MG immediate release tablet [id number] Yes Take 0.5 tablets (2.5 mg total) by mouth every 6 (six) hours as needed for up to 3 days for moderate pain (pain score 4-6). Perri DELENA Meliton Raddle., MD  Active   pantoprazole  (PROTONIX ) 40 MG tablet [id number] Yes Take 1 tablet (40 mg total) by mouth daily. Celestia Rosaline SQUIBB, NP  Active Self, Pharmacy Records           Med Note ANGELICA, ALAN JULIANNA Kitchens Jul 16, 2023 11:56 AM)    polyethylene glycol powder (MIRALAX ) 17 GM/SCOOP powder [id number] Yes Take 17 g by mouth daily as needed (constipation.). [provider]  Active Self, Pharmacy Records  rosuvastatin  (CRESTOR ) 10 MG tablet [id number] Yes Take 10 mg by mouth  at bedtime. [provider]  Active Self, Pharmacy Records  Med List Note (Card, Amy L, CPhT 03/15/23 0000): Dialysis: Tuesday, Thursday, and Saturday.             Home Care and Equipment/Supplies: Were Home Health Services Ordered?: NA Any new equipment or medical supplies ordered?: NA  Functional Questionnaire: Do you need assistance with bathing/showering or dressing?: No Do you need assistance with meal preparation?: No Do you need assistance with eating?: No Do you have difficulty maintaining continence: No Do you need assistance with getting out of bed/getting out of a chair/moving?: No Do you have difficulty managing or taking your medications?: No  Follow up appointments reviewed: PCP Follow-up appointment confirmed?: Yes Date of PCP follow-up appointment?: 04/17/24 Follow-up Provider: Rosaline Bohr Specialist [facility] Follow-up appointment confirmed?: NA Do you need transportation to your follow-up appointment?: No Do you understand care options if your condition(s) worsen?: Yes-patient  verbalized understanding  SDOH Interventions Today    Flowsheet Row Most Recent Value  SDOH Interventions   Food Insecurity Interventions Intervention Not Indicated  Housing Interventions Intervention Not Indicated  Transportation Interventions Intervention Not Indicated  Utilities Interventions Intervention Not Indicated   Care guide scheduled PCP appointment for 04/17/2024  The patient has been provided with contact information for the care management team and has been advised to call with any health-related questions or concerns. The patient verbalized understanding with current POC. The patient is directed to their insurance card regarding availability of benefits coverage   Cathlean Headland BSN RN [facility] Health Care Management Coordinator [email or web address] Direct Dial : [phone]  Fax: [phone] Website: [email or web address]

## 2024-03-31 NOTE — Progress Notes (Signed)
 Late Note entry- March 31, 2024  Pt was d/c on Saturday. Contacted [facility] this morning to be advised of pt's d/c date and that pt should resume care tomorrow.   Randine Mungo Dialysis Navigator [phone]

## 2024-04-01 DIAGNOSIS — N186 End stage renal disease: Secondary | ICD-10-CM | POA: Diagnosis not present

## 2024-04-01 DIAGNOSIS — E1129 Type 2 diabetes mellitus with other diabetic kidney complication: Secondary | ICD-10-CM | POA: Diagnosis not present

## 2024-04-01 DIAGNOSIS — D508 Other iron deficiency anemias: Secondary | ICD-10-CM | POA: Diagnosis not present

## 2024-04-01 DIAGNOSIS — Z992 Dependence on renal dialysis: Secondary | ICD-10-CM | POA: Diagnosis not present

## 2024-04-01 DIAGNOSIS — N2581 Secondary hyperparathyroidism of renal origin: Secondary | ICD-10-CM | POA: Diagnosis not present

## 2024-04-01 DIAGNOSIS — D631 Anemia in chronic kidney disease: Secondary | ICD-10-CM | POA: Diagnosis not present

## 2024-04-01 NOTE — Telephone Encounter (Signed)
 Requested Prescriptions  Pending Prescriptions Disp Refills   loratadine  (CLARITIN ) 10 MG tablet [Pharmacy Med Name: loratadine  10 mg tablet] 90 tablet 0    Sig: TAKE ONE TABLET (10 mg total) BY MOUTH DAILY AT 9AM EVERY MORNING.     Ear, Nose, and Throat:  Antihistamines 2 Failed - 04/01/2024  8:38 AM      Failed - Cr in normal range and within 360 days    Creatinine, Ser  Date Value Ref Range Status  03/29/2024 9.01 (H) 0.44 - 1.00 mg/dL Final   Creatinine, Urine  Date Value Ref Range Status  07/29/2021 97.28 mg/dL Final    Comment:    Performed at [facility], [address] [place], [place] [address]         Passed - Valid encounter within last 12 months    Recent Outpatient Visits           4 months ago Generalized anxiety disorder   [place] [facility] Celestia Rosaline SQUIBB, NP   4 months ago Cough productive of purulent sputum   [place] [facility] Celestia Rosaline SQUIBB, NP   1 year ago Generalized anxiety disorder   [place] [facility] Celestia Rosaline SQUIBB, NP   1 year ago Diabetes mellitus type 2 in obese [facility])   [place] [facility] Celestia Rosaline SQUIBB, NP   1 year ago Diabetes mellitus type 2 in obese [facility])   [facility] [place]. [facility] Delbert Clam, MD       Future Appointments             In 1 month Lonni Slain, MD [facility], [facility]

## 2024-04-04 ENCOUNTER — Other Ambulatory Visit (HOSPITAL_COMMUNITY): Payer: Self-pay | Admitting: Internal Medicine

## 2024-04-04 DIAGNOSIS — E1169 Type 2 diabetes mellitus with other specified complication: Secondary | ICD-10-CM

## 2024-04-05 DIAGNOSIS — E1129 Type 2 diabetes mellitus with other diabetic kidney complication: Secondary | ICD-10-CM | POA: Diagnosis not present

## 2024-04-08 DIAGNOSIS — D631 Anemia in chronic kidney disease: Secondary | ICD-10-CM | POA: Diagnosis not present

## 2024-04-10 DIAGNOSIS — E1129 Type 2 diabetes mellitus with other diabetic kidney complication: Secondary | ICD-10-CM | POA: Diagnosis not present

## 2024-04-10 DIAGNOSIS — D508 Other iron deficiency anemias: Secondary | ICD-10-CM | POA: Diagnosis not present

## 2024-04-10 DIAGNOSIS — N186 End stage renal disease: Secondary | ICD-10-CM | POA: Diagnosis not present

## 2024-04-10 DIAGNOSIS — N2581 Secondary hyperparathyroidism of renal origin: Secondary | ICD-10-CM | POA: Diagnosis not present

## 2024-04-10 DIAGNOSIS — D631 Anemia in chronic kidney disease: Secondary | ICD-10-CM | POA: Diagnosis not present

## 2024-04-11 ENCOUNTER — Ambulatory Visit: Admitting: Cardiology

## 2024-04-12 DIAGNOSIS — D508 Other iron deficiency anemias: Secondary | ICD-10-CM | POA: Diagnosis not present

## 2024-04-15 ENCOUNTER — Ambulatory Visit

## 2024-04-15 DIAGNOSIS — N186 End stage renal disease: Secondary | ICD-10-CM | POA: Diagnosis not present

## 2024-04-16 ENCOUNTER — Telehealth (INDEPENDENT_AMBULATORY_CARE_PROVIDER_SITE_OTHER): Payer: Self-pay | Admitting: Primary Care

## 2024-04-16 NOTE — Telephone Encounter (Signed)
 Called pt to confirm appt. Pt will be present.

## 2024-04-17 ENCOUNTER — Inpatient Hospital Stay (INDEPENDENT_AMBULATORY_CARE_PROVIDER_SITE_OTHER): Admitting: Primary Care

## 2024-04-17 DIAGNOSIS — N186 End stage renal disease: Secondary | ICD-10-CM | POA: Diagnosis not present

## 2024-04-19 ENCOUNTER — Encounter (HOSPITAL_BASED_OUTPATIENT_CLINIC_OR_DEPARTMENT_OTHER): Payer: Self-pay | Admitting: Emergency Medicine

## 2024-04-19 ENCOUNTER — Other Ambulatory Visit: Payer: Self-pay

## 2024-04-19 ENCOUNTER — Emergency Department (HOSPITAL_BASED_OUTPATIENT_CLINIC_OR_DEPARTMENT_OTHER)

## 2024-04-19 ENCOUNTER — Emergency Department (HOSPITAL_BASED_OUTPATIENT_CLINIC_OR_DEPARTMENT_OTHER)
Admission: EM | Admit: 2024-04-19 | Discharge: 2024-04-19 | Disposition: A | Discharge status: Home / Self Care | Attending: Emergency Medicine | Admitting: Emergency Medicine

## 2024-04-19 DIAGNOSIS — R519 Headache, unspecified: Secondary | ICD-10-CM | POA: Insufficient documentation

## 2024-04-19 DIAGNOSIS — I495 Sick sinus syndrome: Secondary | ICD-10-CM | POA: Diagnosis not present

## 2024-04-19 DIAGNOSIS — I5032 Chronic diastolic (congestive) heart failure: Secondary | ICD-10-CM | POA: Diagnosis not present

## 2024-04-19 DIAGNOSIS — Z794 Long term (current) use of insulin: Secondary | ICD-10-CM | POA: Insufficient documentation

## 2024-04-19 DIAGNOSIS — S32059A Unspecified fracture of fifth lumbar vertebra, initial encounter for closed fracture: Secondary | ICD-10-CM | POA: Diagnosis not present

## 2024-04-19 DIAGNOSIS — Z7901 Long term (current) use of anticoagulants: Secondary | ICD-10-CM | POA: Insufficient documentation

## 2024-04-19 DIAGNOSIS — N2581 Secondary hyperparathyroidism of renal origin: Secondary | ICD-10-CM | POA: Diagnosis not present

## 2024-04-19 DIAGNOSIS — I672 Cerebral atherosclerosis: Secondary | ICD-10-CM | POA: Diagnosis not present

## 2024-04-19 DIAGNOSIS — Z043 Encounter for examination and observation following other accident: Secondary | ICD-10-CM | POA: Diagnosis not present

## 2024-04-19 DIAGNOSIS — I4891 Unspecified atrial fibrillation: Secondary | ICD-10-CM | POA: Diagnosis not present

## 2024-04-19 DIAGNOSIS — G4733 Obstructive sleep apnea (adult) (pediatric): Secondary | ICD-10-CM | POA: Diagnosis not present

## 2024-04-19 DIAGNOSIS — S7001XA Contusion of right hip, initial encounter: Secondary | ICD-10-CM | POA: Diagnosis not present

## 2024-04-19 DIAGNOSIS — I132 Hypertensive heart and chronic kidney disease with heart failure and with stage 5 chronic kidney disease, or end stage renal disease: Secondary | ICD-10-CM | POA: Diagnosis not present

## 2024-04-19 DIAGNOSIS — M5136 Other intervertebral disc degeneration, lumbar region with discogenic back pain only: Secondary | ICD-10-CM | POA: Diagnosis not present

## 2024-04-19 DIAGNOSIS — I4892 Unspecified atrial flutter: Secondary | ICD-10-CM | POA: Diagnosis not present

## 2024-04-19 DIAGNOSIS — J4489 Other specified chronic obstructive pulmonary disease: Secondary | ICD-10-CM | POA: Diagnosis not present

## 2024-04-19 DIAGNOSIS — W07XXXA Fall from chair, initial encounter: Secondary | ICD-10-CM | POA: Insufficient documentation

## 2024-04-19 DIAGNOSIS — M47812 Spondylosis without myelopathy or radiculopathy, cervical region: Secondary | ICD-10-CM | POA: Diagnosis not present

## 2024-04-19 DIAGNOSIS — I517 Cardiomegaly: Secondary | ICD-10-CM | POA: Diagnosis not present

## 2024-04-19 DIAGNOSIS — S0990XA Unspecified injury of head, initial encounter: Secondary | ICD-10-CM | POA: Diagnosis not present

## 2024-04-19 DIAGNOSIS — M549 Dorsalgia, unspecified: Secondary | ICD-10-CM | POA: Diagnosis not present

## 2024-04-19 DIAGNOSIS — Z992 Dependence on renal dialysis: Secondary | ICD-10-CM | POA: Insufficient documentation

## 2024-04-19 DIAGNOSIS — S32049A Unspecified fracture of fourth lumbar vertebra, initial encounter for closed fracture: Secondary | ICD-10-CM | POA: Diagnosis not present

## 2024-04-19 DIAGNOSIS — S20211A Contusion of right front wall of thorax, initial encounter: Secondary | ICD-10-CM | POA: Diagnosis not present

## 2024-04-19 DIAGNOSIS — I722 Aneurysm of renal artery: Secondary | ICD-10-CM | POA: Diagnosis not present

## 2024-04-19 DIAGNOSIS — D631 Anemia in chronic kidney disease: Secondary | ICD-10-CM | POA: Diagnosis not present

## 2024-04-19 DIAGNOSIS — I4819 Other persistent atrial fibrillation: Secondary | ICD-10-CM | POA: Diagnosis not present

## 2024-04-19 DIAGNOSIS — M47816 Spondylosis without myelopathy or radiculopathy, lumbar region: Secondary | ICD-10-CM | POA: Diagnosis not present

## 2024-04-19 DIAGNOSIS — D508 Other iron deficiency anemias: Secondary | ICD-10-CM | POA: Diagnosis not present

## 2024-04-19 DIAGNOSIS — R9389 Abnormal findings on diagnostic imaging of other specified body structures: Secondary | ICD-10-CM | POA: Diagnosis not present

## 2024-04-19 DIAGNOSIS — E1122 Type 2 diabetes mellitus with diabetic chronic kidney disease: Secondary | ICD-10-CM | POA: Diagnosis not present

## 2024-04-19 DIAGNOSIS — E1129 Type 2 diabetes mellitus with other diabetic kidney complication: Secondary | ICD-10-CM | POA: Diagnosis not present

## 2024-04-19 DIAGNOSIS — E785 Hyperlipidemia, unspecified: Secondary | ICD-10-CM | POA: Diagnosis not present

## 2024-04-19 DIAGNOSIS — N186 End stage renal disease: Secondary | ICD-10-CM | POA: Diagnosis not present

## 2024-04-19 DIAGNOSIS — R109 Unspecified abdominal pain: Secondary | ICD-10-CM | POA: Diagnosis not present

## 2024-04-19 DIAGNOSIS — G8929 Other chronic pain: Secondary | ICD-10-CM | POA: Diagnosis not present

## 2024-04-19 DIAGNOSIS — M5417 Radiculopathy, lumbosacral region: Secondary | ICD-10-CM | POA: Diagnosis not present

## 2024-04-19 DIAGNOSIS — S199XXA Unspecified injury of neck, initial encounter: Secondary | ICD-10-CM | POA: Diagnosis not present

## 2024-04-19 DIAGNOSIS — M5416 Radiculopathy, lumbar region: Secondary | ICD-10-CM | POA: Diagnosis not present

## 2024-04-19 DIAGNOSIS — M48061 Spinal stenosis, lumbar region without neurogenic claudication: Secondary | ICD-10-CM | POA: Diagnosis not present

## 2024-04-19 DIAGNOSIS — I2510 Atherosclerotic heart disease of native coronary artery without angina pectoris: Secondary | ICD-10-CM | POA: Diagnosis not present

## 2024-04-19 DIAGNOSIS — S3991XA Unspecified injury of abdomen, initial encounter: Secondary | ICD-10-CM | POA: Diagnosis not present

## 2024-04-19 LAB — CBC WITH DIFFERENTIAL/PLATELET
Abs Immature Granulocytes: 0.02 K/uL (ref 0.00–0.07)
Basophils Absolute: 0 K/uL (ref 0.0–0.1)
Basophils Relative: 0 %
Eosinophils Absolute: 0.2 K/uL (ref 0.0–0.5)
Eosinophils Relative: 3 %
HCT: 34 % — ABNORMAL LOW (ref 36.0–46.0)
Hemoglobin: 10.7 g/dL — ABNORMAL LOW (ref 12.0–15.0)
Immature Granulocytes: 0 %
Lymphocytes Relative: 21 %
Lymphs Abs: 1.2 K/uL (ref 0.7–4.0)
MCH: 31.6 pg (ref 26.0–34.0)
MCHC: 31.5 g/dL (ref 30.0–36.0)
MCV: 100.3 fL — ABNORMAL HIGH (ref 80.0–100.0)
Monocytes Absolute: 0.9 K/uL (ref 0.1–1.0)
Monocytes Relative: 16 %
Neutro Abs: 3.3 K/uL (ref 1.7–7.7)
Neutrophils Relative %: 60 %
Platelets: 159 K/uL (ref 150–400)
RBC: 3.39 MIL/uL — ABNORMAL LOW (ref 3.87–5.11)
RDW: 14.7 % (ref 11.5–15.5)
WBC: 5.5 K/uL (ref 4.0–10.5)
nRBC: 0 % (ref 0.0–0.2)

## 2024-04-19 LAB — BASIC METABOLIC PANEL WITH GFR
Anion gap: 16 — ABNORMAL HIGH (ref 5–15)
BUN: 13 mg/dL (ref 8–23)
CO2: 29 mmol/L (ref 22–32)
Calcium: 10 mg/dL (ref 8.9–10.3)
Chloride: 94 mmol/L — ABNORMAL LOW (ref 98–111)
Creatinine, Ser: 4.84 mg/dL — ABNORMAL HIGH (ref 0.44–1.00)
GFR, Estimated: 9 mL/min — ABNORMAL LOW (ref >60)
Glucose, Bld: 136 mg/dL — ABNORMAL HIGH (ref 70–99)
Potassium: 3.4 mmol/L — ABNORMAL LOW (ref 3.5–5.1)
Sodium: 139 mmol/L (ref 135–145)

## 2024-04-19 MED ORDER — OXYCODONE-ACETAMINOPHEN 5-325 MG PO TABS
1.0000 | ORAL_TABLET | Freq: Three times a day (TID) | ORAL | 0 refills | Status: DC | PRN
Start: 2024-04-19 — End: 2024-04-25

## 2024-04-19 MED ORDER — SODIUM CHLORIDE 0.9 % IV BOLUS
500.0000 mL | Freq: Once | INTRAVENOUS | Status: AC
  Administered 2024-04-19: 500 mL via INTRAVENOUS

## 2024-04-19 MED ORDER — FENTANYL CITRATE PF 50 MCG/ML IJ SOSY
50.0000 ug | PREFILLED_SYRINGE | Freq: Once | INTRAMUSCULAR | Status: AC
  Administered 2024-04-19: 50 ug via INTRAVENOUS
  Filled 2024-04-19: qty 1

## 2024-04-19 NOTE — ED Provider Notes (Signed)
 [place] EMERGENCY DEPARTMENT AT [facility] Provider Note   CSN: [id number] Arrival date & time: 04/19/24  1415     Patient presents with: [name] is a 68 y.o. female.  With a history of ESRD on dialysis, atrial fibrillation on Eliquis  and morbid obesity presents to the ED for hip pain.  She fell out of a chair 2 days ago.  Positive head strike.  No LOC.  Now with pain in her right hip.  Has had dialysis as scheduled (TTS) and completed her session today.  Compliant with Eliquis .  Is scheduled for upcoming ablation.  No neck pain chest pain shortness of breath abdominal pain or pain in other extremities.    Fall       Prior to Admission medications   Medication Sig Start Date End Date Taking? Authorizing Provider  oxyCODONE -acetaminophen  (PERCOCET/ROXICET) 5-325 MG tablet Take 1 tablet by mouth every 8 (eight) hours as needed for up to 3 days for severe pain (pain score 7-10). 04/19/24 04/22/24 Yes Pamella Ozell LABOR, DO  Accu-Chek Softclix Lancets lancets Use to check blood sugar 3 times daily. 01/01/24   Newlin, Enobong, MD  acetaminophen  (TYLENOL ) 325 MG tablet Take 2 tablets (650 mg total) by mouth every 6 (six) hours as needed for mild pain or moderate pain. 12/07/21   Arrien, Mauricio Daniel, MD  albuterol  (PROVENTIL  HFA) [phone] Base) MCG/ACT inhaler Inhale 1-2 puffs into the lungs every 6 (six) hours as needed for wheezing or shortness of breath. 10/13/22   Celestia Rosaline SQUIBB, NP  amiodarone  (PACERONE ) 200 MG tablet Take 1 tablet (200 mg total) by mouth 2 (two) times daily. Follow with cardiology for refills 03/29/24   Perri LABOR Meliton Mickey., MD  AURYXIA  1 GM 210 MG(Fe) tablet Take 210 mg by mouth 3 (three) times daily.    [provider]  Blood Glucose Monitoring Suppl (ACCU-CHEK GUIDE) w/Device KIT Use to check blood sugar 3 times daily. 01/01/24   Newlin, Enobong, MD  busPIRone  (BUSPAR ) 5 MG tablet TAKE ONE TABLET BY MOUTH TWICE DAILY @9AM -5PM  02/14/24   Celestia Rosaline SQUIBB, NP  diphenhydrAMINE  (BENADRYL ) 25 MG tablet Take 25 mg by mouth every 6 (six) hours as needed for itching. Patient taking differently: Take 25 mg by mouth as needed for itching. 03/31/22   [provider]  doxercalciferol  (HECTOROL ) 4 MCG/2ML injection Inject 1.5 mLs (3 mcg total) into the vein Every Tuesday,Thursday,and Saturday with dialysis. 03/06/24   Amin, Ankit C, MD  DULoxetine  (CYMBALTA ) 60 MG capsule TAKE ONE CAPSULE (60MG  TOTAL) BY MOUTH DAILY AT 9AM 02/29/24   Celestia Rosaline SQUIBB, NP  ELIQUIS  5 MG TABS tablet TAKE ONE TABLET (5MG  TOTAL) BY MOUTH TWICE DAILY @9AM -5PM 09/06/23   Bensimhon, Toribio SAUNDERS, MD  EPINEPHrine  0.3 mg/0.3 mL IJ SOAJ injection Inject 0.3 mg into the muscle as needed for anaphylaxis. 06/25/20   Celestia Rosaline SQUIBB, NP  fluticasone  (FLONASE  ALLERGY RELIEF) 50 MCG/ACT nasal spray Place 2 sprays into both nostrils daily as needed for allergies. Patient taking differently: Place 2 sprays into both nostrils as needed for allergies. 03/31/22   [provider]  gabapentin  (NEURONTIN ) 100 MG capsule Take 1 capsule (100 mg total) by mouth every Tuesday, Thursday, and Saturday at 6 PM. 03/29/24 04/28/24  Perri LABOR Meliton Mickey., MD  glucose blood (ACCU-CHEK GUIDE TEST) test strip Use to check blood sugar 3 times daily. 01/01/24   Newlin, Enobong, MD  insulin  lispro (HUMALOG  KWIKPEN) 100 UNIT/ML  KwikPen For blood sugars 0-150 give 0 units of insulin , 151-200 give 2 units of insulin , 201-250 give 4 units, 251-300 give 6 units, 301-350 give 8 units, 351-400 give 10 units,> 400 give 12 units and call M.D. 09/27/23   Newlin, Enobong, MD  lidocaine -prilocaine  (EMLA ) cream Apply 1 Application topically Every Tuesday,Thursday,and Saturday with dialysis. 06/01/22   [provider]  loratadine  (CLARITIN ) 10 MG tablet TAKE ONE TABLET (10 mg total) BY MOUTH DAILY AT 9AM EVERY MORNING. 04/01/24   Celestia Rosaline SQUIBB, NP  Methoxy PEG-Epoetin  Beta (MIRCERA  IJ) 50 mcg. 01/29/24 01/27/25  [provider]  metoCLOPramide  (REGLAN ) 5 MG tablet Take 1 tablet (5 mg total) by mouth every 8 (eight) hours as needed for nausea. 07/10/23   Celestia Rosaline SQUIBB, NP  midodrine  (PROAMATINE ) 10 MG tablet Take 1 tablet (10 mg total) by mouth 2 (two) times daily. 03/05/24   Amin, Ankit C, MD  Multiple Vitamins-Minerals (MULTIVITAMIN WOMEN PO) Take 1 tablet by mouth daily.    [provider]  nitroGLYCERIN  (NITROSTAT ) 0.4 MG SL tablet Place 1 tablet (0.4 mg total) under the tongue every 5 (five) minutes x 3 doses as needed for chest pain. 11/08/23   Lonni Slain, MD  pantoprazole  (PROTONIX ) 40 MG tablet Take 1 tablet (40 mg total) by mouth daily. 04/24/23   Celestia Rosaline SQUIBB, NP  polyethylene glycol powder (MIRALAX ) 17 GM/SCOOP powder Take 17 g by mouth daily as needed (constipation.). 06/26/22   [provider]  rosuvastatin  (CRESTOR ) 10 MG tablet TAKE ONE TABLET BY MOUTH DAILY AT 5PM 04/04/24   Bensimhon, Toribio SAUNDERS, MD    Allergies: Bee pollen, Bee venom, Sulfa antibiotics, Chlorhexidine , and Iodinated contrast media    Review of Systems  Updated Vital Signs BP 101/83   Pulse (!) 108   Temp (!) 97.2 F (36.2 C)   Resp 19   Wt 114.3 kg   SpO2 99%   BMI 39.47 kg/m   Physical Exam Vitals and nursing note reviewed.  Constitutional:      Appearance: She is obese.  HENT:     Head: Normocephalic and atraumatic.  Eyes:     Pupils: Pupils are equal, round, and reactive to light.  Cardiovascular:     Rate and Rhythm: Normal rate and regular rhythm.  Pulmonary:     Effort: Pulmonary effort is normal.     Breath sounds: Normal breath sounds.  Abdominal:     Palpations: Abdomen is soft.     Tenderness: There is no abdominal tenderness.  Musculoskeletal:     Cervical back: Normal range of motion and neck supple. No tenderness.     Comments: Tenderness over right anterior hip and midline lumbar right paraspinal lumbar region No  step-off deformities Symmetric motor strength bilateral upper and lower extremities  Skin:    General: Skin is warm and dry.  Neurological:     Mental Status: She is alert.  Psychiatric:        Mood and Affect: Mood normal.     (all labs ordered are listed, but only abnormal results are displayed) Labs Reviewed  CBC WITH DIFFERENTIAL/PLATELET - Abnormal; Notable for the following components:      Result Value   RBC 3.39 (*)    Hemoglobin 10.7 (*)    HCT 34.0 (*)    MCV 100.3 (*)    All other components within normal limits  BASIC METABOLIC PANEL WITH GFR - Abnormal; Notable for the following components:   Potassium 3.4 (*)  Chloride 94 (*)    Glucose, Bld 136 (*)    Creatinine, Ser 4.84 (*)    GFR, Estimated 9 (*)    Anion gap 16 (*)    All other components within normal limits    EKG: EKG Interpretation Date/Time:  Saturday April 19 2024 14:38:03 EDT Ventricular Rate:  127 PR Interval:    QRS Duration:  93 QT Interval:  370 QTC Calculation: 538 R Axis:   164  Text Interpretation: Atrial flutter Right ventricular hypertrophy Inferior infarct, old Probable anterolateral infarct, old Prolonged QT interval Baseline wander in lead(s) V5 V6 Confirmed by Pamella Sharper [phone]) on 04/19/2024 5:53:00 PM  Radiology: CT ABDOMEN PELVIS WO CONTRAST Result Date: 04/19/2024 CLINICAL DATA:  Blunt abdominal trauma. Right hip and low back pain. EXAM: CT ABDOMEN AND PELVIS WITHOUT CONTRAST TECHNIQUE: Multidetector CT imaging of the abdomen and pelvis was performed following the standard protocol without IV contrast. RADIATION DOSE REDUCTION: This exam was performed according to the departmental dose-optimization program which includes automated exposure control, adjustment of the mA and/or kV according to patient size and/or use of iterative reconstruction technique. COMPARISON:  Most recent CT 11/09/2023, concurrent lumbar spine reformats, reported separately. FINDINGS: Lower chest: The  heart is enlarged. Heterogeneous pulmonary parenchyma, chronic. No basilar pneumothorax or pleural effusion. Hepatobiliary: Assessment for injury is limited in the absence of IV contrast. Homogeneous hepatic attenuation. No perihepatic hematoma. Clips in the gallbladder fossa postcholecystectomy. No biliary dilatation. Pancreas: Assessment for injury is limited in the absence of IV contrast. No ductal dilatation or inflammation. Spleen: Assessment for injury is limited in the absence of IV contrast. Homogeneous attenuation. No perisplenic hematoma. Adrenals/Urinary Tract: Chronic adrenal thickening without adrenal hematoma. Assessment for renal injury is limited in the absence of IV contrast. No evidence of renal inflammation or hydronephrosis. Partially calcified left renal artery aneurysm are unchanged appearance from prior, 16 mm anteriorly a and 13 mm posteriorly. The urinary bladder is completely empty and not well assessed. Stomach/Bowel: No evidence of bowel injury or mesenteric hematoma. No bowel wall thickening or inflammatory change. Small to moderate colonic stool burden. Normal appendix. Vascular/Lymphatic: Aortic atherosclerosis. No retroperitoneal fluid to suggest vascular injury. No suspicious lymphadenopathy. Reproductive: Hysterectomy.  Quiescent ovaries. Other: No free fluid. No free air. No confluent body wall contusion. Musculoskeletal: Lumbar spine is assessed on concurrent lumbar spine reformats, reported separately. T11 compression fracture is chronic and unchanged from prior exam. No fracture of the pelvis or right hip. No sacral fracture. No obvious intramuscular hematoma or muscle injury to account for pain. IMPRESSION: 1. No evidence of acute traumatic injury to the abdomen or pelvis. Lumbar spine CT reported separately. 2. Chronic T11 compression fracture. 3. Unchanged left renal artery aneurysms. Aortic Atherosclerosis (ICD10-I70.0). Electronically Signed   By: Andrea Gasman M.D.    On: 04/19/2024 17:11   CT L-SPINE NO CHARGE Result Date: 04/19/2024 CLINICAL DATA:  Right lower back pain after fall. EXAM: CT LUMBAR SPINE WITHOUT CONTRAST TECHNIQUE: Multidetector CT imaging of the lumbar spine was performed without intravenous contrast administration. Multiplanar CT image reconstructions were also generated. RADIATION DOSE REDUCTION: This exam was performed according to the departmental dose-optimization program which includes automated exposure control, adjustment of the mA and/or kV according to patient size and/or use of iterative reconstruction technique. COMPARISON:  Lumbar spine CT three weeks ago 03/28/2024 FINDINGS: Segmentation: 5 lumbar type vertebrae. Alignment: Normal. Vertebrae: Stable minimal L4 superior endplate compression deformity. No progressive loss of height. There is slight undulation of the  superior endplate of L5 that is new from prior exam. Posterior elements are intact. Paraspinal and other soft tissues: Assessed fully on concurrent abdominopelvic CT, reported separately. There is no paravertebral soft tissue thickening or hematoma, including at the L5 level. Disc levels: Multilevel degenerative disc disease and facet hypertrophy. Degenerative changes at L4-L5 causes spinal canal and neural foraminal stenosis. IMPRESSION: 1. Slight undulation of the superior endplate of L5 is new from exam 3 weeks ago. May represent acute/subacute minor compression fracture. There is no significant loss of height. 2. Stable minimal L4 superior endplate compression deformity. 3. Multilevel degenerative disc disease and facet hypertrophy. Degenerative changes at L4-L5 causes spinal canal and neural foraminal stenosis. Electronically Signed   By: Andrea Gasman M.D.   On: 04/19/2024 17:04   CT Cervical Spine Wo Contrast Result Date: 04/19/2024 CLINICAL DATA:  Neck trauma (Age >= 65y) EXAM: CT CERVICAL SPINE WITHOUT CONTRAST TECHNIQUE: Multidetector CT imaging of the cervical spine  was performed without intravenous contrast. Multiplanar CT image reconstructions were also generated. RADIATION DOSE REDUCTION: This exam was performed according to the departmental dose-optimization program which includes automated exposure control, adjustment of the mA and/or kV according to patient size and/or use of iterative reconstruction technique. COMPARISON:  06/18/2023 FINDINGS: Alignment: Straightening of normal lordosis. No traumatic subluxation. Skull base and vertebrae: No acute fracture. Vertebral body heights are maintained. The dens and skull base are intact. Non fusion posterior arch of C1, variant anatomy. Soft tissues and spinal canal: No prevertebral fluid or swelling. No visible canal hematoma. Disc levels:  Mild degenerative changes are stable from prior exam. Upper chest: Nonacute. Other: None. IMPRESSION: Mild degenerative change in the cervical spine without acute fracture or subluxation. Electronically Signed   By: Andrea Gasman M.D.   On: 04/19/2024 17:00   CT Head Wo Contrast Result Date: 04/19/2024 CLINICAL DATA:  Head trauma, minor (Age >= 65y) EXAM: CT HEAD WITHOUT CONTRAST TECHNIQUE: Contiguous axial images were obtained from the base of the skull through the vertex without intravenous contrast. RADIATION DOSE REDUCTION: This exam was performed according to the departmental dose-optimization program which includes automated exposure control, adjustment of the mA and/or kV according to patient size and/or use of iterative reconstruction technique. COMPARISON:  Head CT 06/18/2023 FINDINGS: Brain: No intracranial hemorrhage, mass effect, or midline shift. No hydrocephalus. The basilar cisterns are patent. No evidence of territorial infarct or acute ischemia. No extra-axial or intracranial fluid collection. Vascular: Atherosclerosis of skullbase vasculature without hyperdense vessel or abnormal calcification. Skull: No fracture or focal lesion. Sinuses/Orbits: No acute finding.   Bilateral cataract resection. Other: No confluent scalp hematoma. IMPRESSION: No acute intracranial abnormality. No skull fracture. Electronically Signed   By: Andrea Gasman M.D.   On: 04/19/2024 16:55     Procedures   Medications Ordered in the ED  sodium chloride  0.9 % bolus 500 mL (0 mLs Intravenous Stopped 04/19/24 1545)  fentaNYL  (SUBLIMAZE ) injection 50 mcg (50 mcg Intravenous Given 04/19/24 1623)    Clinical Course as of 04/19/24 1810  Sat Apr 19, 2024  1804 No acute traumatic findings that would require admission or further workup.  Old compression fracture may be a mild subacute compression fracture.  Will discharge with a couple days of Percocet for pain management.  She remains in A-fib with rate oscillating low 100s up to the 130s at times.  Blood pressure has remained stable.  Discussed rationale for cardioversion versus close cardiology follow-up.  At this time patient would like to discuss with  her cardiology team and move forward with the scheduled ablation.  I am okay with this as her rate has been reasonably controlled here and she is on Eliquis .  She did not take her morning medications prior to dialysis.  She has all of her meds at home.  Return precautions of be worrisome for unstable A-fib or worsening pain were discussed in detail. [MP]    Clinical Course User Index [MP] Pamella Ozell LABOR, DO                                 Medical Decision Making 68 year old female with history as above presented to ED for pain 4 days after a fall out of a chair.  Struck her head.  Landed on her right side.  Now with lower back right hip pain.  No other evidence of trauma on my exam.  Completed dialysis today.  She is in A-fib oscillating between rate control and RVR.  Compliant with Eliquis .  Scheduled for ablation upcoming.  Will obtain CT head C-spine as well as abdomen pelvis with lumbar reconstruction look for traumatic injury.  Amount and/or Complexity of Data Reviewed Labs:  ordered. Radiology: ordered.  Risk Prescription drug management.        Final diagnoses:  Contusion of right hip, initial encounter  Atrial fibrillation, unspecified type [facility])    ED Discharge Orders          Ordered    oxyCODONE -acetaminophen  (PERCOCET/ROXICET) 5-325 MG tablet  Every 8 hours PRN        04/19/24 1809               Pamella Ozell LABOR, DO 04/19/24 1810

## 2024-04-19 NOTE — Discharge Instructions (Addendum)
 You were seen in the emergency room for right hip and right lower back pain The CAT scans did not show any major injuries that are new You have some chronic fractures in your back also called compression fractures You were in A-fib here and your rate was mostly controlled and your blood pressure looked okay You need to follow-up with your cardiology team to discuss procedure for ablation Return to the emergency room for severe pain We have called in Percocet for you to take for severe pain Do not drink alcohol  or drive with taking Percocet Follow-up with your dialysis team and continue take all previous prescribed indications Return to the emergency department for fast heart rate trouble breathing or any other concerns

## 2024-04-19 NOTE — ED Triage Notes (Signed)
 Pt bib wheelchair, endorses fall from chair, c/o back and RT side hip pain x 4 days pta. Also reports HTN, not responding to medication. Dialysis today. 650mg  tylenol  pta. Pt reports hitting head, takes eliquis 

## 2024-04-20 DIAGNOSIS — Z992 Dependence on renal dialysis: Secondary | ICD-10-CM | POA: Diagnosis not present

## 2024-04-20 DIAGNOSIS — E1122 Type 2 diabetes mellitus with diabetic chronic kidney disease: Secondary | ICD-10-CM | POA: Diagnosis not present

## 2024-04-20 DIAGNOSIS — N186 End stage renal disease: Secondary | ICD-10-CM | POA: Diagnosis not present

## 2024-04-22 ENCOUNTER — Emergency Department (HOSPITAL_COMMUNITY)

## 2024-04-22 ENCOUNTER — Other Ambulatory Visit: Payer: Self-pay

## 2024-04-22 ENCOUNTER — Encounter (HOSPITAL_COMMUNITY): Payer: Self-pay

## 2024-04-22 ENCOUNTER — Inpatient Hospital Stay (HOSPITAL_COMMUNITY)
Admission: EM | Admit: 2024-04-22 | Discharge: 2024-04-25 | DRG: 273 | Disposition: A | Discharge status: Home Health | Attending: Internal Medicine | Admitting: Internal Medicine

## 2024-04-22 DIAGNOSIS — Z794 Long term (current) use of insulin: Secondary | ICD-10-CM

## 2024-04-22 DIAGNOSIS — G8929 Other chronic pain: Secondary | ICD-10-CM | POA: Diagnosis present

## 2024-04-22 DIAGNOSIS — R000 Tachycardia, unspecified: Secondary | ICD-10-CM | POA: Diagnosis not present

## 2024-04-22 DIAGNOSIS — E66813 Obesity, class 3: Secondary | ICD-10-CM | POA: Diagnosis present

## 2024-04-22 DIAGNOSIS — K7460 Unspecified cirrhosis of liver: Secondary | ICD-10-CM | POA: Diagnosis not present

## 2024-04-22 DIAGNOSIS — S32059A Unspecified fracture of fifth lumbar vertebra, initial encounter for closed fracture: Secondary | ICD-10-CM | POA: Diagnosis not present

## 2024-04-22 DIAGNOSIS — I4892 Unspecified atrial flutter: Secondary | ICD-10-CM | POA: Diagnosis not present

## 2024-04-22 DIAGNOSIS — S7001XA Contusion of right hip, initial encounter: Secondary | ICD-10-CM | POA: Diagnosis present

## 2024-04-22 DIAGNOSIS — F32A Depression, unspecified: Secondary | ICD-10-CM | POA: Diagnosis present

## 2024-04-22 DIAGNOSIS — I2510 Atherosclerotic heart disease of native coronary artery without angina pectoris: Secondary | ICD-10-CM | POA: Diagnosis present

## 2024-04-22 DIAGNOSIS — D631 Anemia in chronic kidney disease: Secondary | ICD-10-CM | POA: Diagnosis not present

## 2024-04-22 DIAGNOSIS — N186 End stage renal disease: Secondary | ICD-10-CM | POA: Diagnosis present

## 2024-04-22 DIAGNOSIS — R109 Unspecified abdominal pain: Secondary | ICD-10-CM | POA: Diagnosis not present

## 2024-04-22 DIAGNOSIS — I5032 Chronic diastolic (congestive) heart failure: Secondary | ICD-10-CM | POA: Diagnosis present

## 2024-04-22 DIAGNOSIS — M5416 Radiculopathy, lumbar region: Secondary | ICD-10-CM | POA: Diagnosis not present

## 2024-04-22 DIAGNOSIS — G4733 Obstructive sleep apnea (adult) (pediatric): Secondary | ICD-10-CM | POA: Diagnosis present

## 2024-04-22 DIAGNOSIS — N2581 Secondary hyperparathyroidism of renal origin: Secondary | ICD-10-CM | POA: Diagnosis present

## 2024-04-22 DIAGNOSIS — Z992 Dependence on renal dialysis: Secondary | ICD-10-CM | POA: Diagnosis not present

## 2024-04-22 DIAGNOSIS — S32049A Unspecified fracture of fourth lumbar vertebra, initial encounter for closed fracture: Secondary | ICD-10-CM | POA: Diagnosis not present

## 2024-04-22 DIAGNOSIS — Z7901 Long term (current) use of anticoagulants: Secondary | ICD-10-CM

## 2024-04-22 DIAGNOSIS — I4891 Unspecified atrial fibrillation: Principal | ICD-10-CM

## 2024-04-22 DIAGNOSIS — M5417 Radiculopathy, lumbosacral region: Secondary | ICD-10-CM | POA: Diagnosis present

## 2024-04-22 DIAGNOSIS — E785 Hyperlipidemia, unspecified: Secondary | ICD-10-CM | POA: Diagnosis present

## 2024-04-22 DIAGNOSIS — I728 Aneurysm of other specified arteries: Secondary | ICD-10-CM | POA: Diagnosis not present

## 2024-04-22 DIAGNOSIS — D7589 Other specified diseases of blood and blood-forming organs: Secondary | ICD-10-CM | POA: Diagnosis present

## 2024-04-22 DIAGNOSIS — I517 Cardiomegaly: Secondary | ICD-10-CM | POA: Diagnosis not present

## 2024-04-22 DIAGNOSIS — Z86718 Personal history of other venous thrombosis and embolism: Secondary | ICD-10-CM

## 2024-04-22 DIAGNOSIS — I132 Hypertensive heart and chronic kidney disease with heart failure and with stage 5 chronic kidney disease, or end stage renal disease: Secondary | ICD-10-CM | POA: Diagnosis not present

## 2024-04-22 DIAGNOSIS — K219 Gastro-esophageal reflux disease without esophagitis: Secondary | ICD-10-CM | POA: Diagnosis present

## 2024-04-22 DIAGNOSIS — Z9049 Acquired absence of other specified parts of digestive tract: Secondary | ICD-10-CM

## 2024-04-22 DIAGNOSIS — I9589 Other hypotension: Secondary | ICD-10-CM | POA: Diagnosis present

## 2024-04-22 DIAGNOSIS — Z6841 Body Mass Index (BMI) 40.0 and over, adult: Secondary | ICD-10-CM

## 2024-04-22 DIAGNOSIS — Z90710 Acquired absence of both cervix and uterus: Secondary | ICD-10-CM

## 2024-04-22 DIAGNOSIS — R9389 Abnormal findings on diagnostic imaging of other specified body structures: Secondary | ICD-10-CM | POA: Diagnosis not present

## 2024-04-22 DIAGNOSIS — E1122 Type 2 diabetes mellitus with diabetic chronic kidney disease: Secondary | ICD-10-CM | POA: Diagnosis present

## 2024-04-22 DIAGNOSIS — M549 Dorsalgia, unspecified: Secondary | ICD-10-CM | POA: Diagnosis not present

## 2024-04-22 DIAGNOSIS — Z950 Presence of cardiac pacemaker: Secondary | ICD-10-CM

## 2024-04-22 DIAGNOSIS — Z91041 Radiographic dye allergy status: Secondary | ICD-10-CM

## 2024-04-22 DIAGNOSIS — W07XXXA Fall from chair, initial encounter: Secondary | ICD-10-CM | POA: Diagnosis present

## 2024-04-22 DIAGNOSIS — I4819 Other persistent atrial fibrillation: Principal | ICD-10-CM

## 2024-04-22 DIAGNOSIS — E1169 Type 2 diabetes mellitus with other specified complication: Secondary | ICD-10-CM

## 2024-04-22 DIAGNOSIS — W19XXXA Unspecified fall, initial encounter: Secondary | ICD-10-CM | POA: Diagnosis not present

## 2024-04-22 DIAGNOSIS — I495 Sick sinus syndrome: Secondary | ICD-10-CM | POA: Diagnosis present

## 2024-04-22 DIAGNOSIS — I120 Hypertensive chronic kidney disease with stage 5 chronic kidney disease or end stage renal disease: Secondary | ICD-10-CM | POA: Diagnosis not present

## 2024-04-22 DIAGNOSIS — J4489 Other specified chronic obstructive pulmonary disease: Secondary | ICD-10-CM | POA: Diagnosis present

## 2024-04-22 DIAGNOSIS — Z882 Allergy status to sulfonamides status: Secondary | ICD-10-CM

## 2024-04-22 DIAGNOSIS — Z91030 Bee allergy status: Secondary | ICD-10-CM

## 2024-04-22 DIAGNOSIS — Z8616 Personal history of COVID-19: Secondary | ICD-10-CM

## 2024-04-22 DIAGNOSIS — Z8249 Family history of ischemic heart disease and other diseases of the circulatory system: Secondary | ICD-10-CM

## 2024-04-22 DIAGNOSIS — Z91048 Other nonmedicinal substance allergy status: Secondary | ICD-10-CM

## 2024-04-22 DIAGNOSIS — Z79899 Other long term (current) drug therapy: Secondary | ICD-10-CM

## 2024-04-22 LAB — GLUCOSE, CAPILLARY
Glucose-Capillary: 145 mg/dL — ABNORMAL HIGH (ref 70–99)
Glucose-Capillary: 171 mg/dL — ABNORMAL HIGH (ref 70–99)
Glucose-Capillary: 131 mg/dL — ABNORMAL HIGH (ref 70–99)
Glucose-Capillary: 166 mg/dL — ABNORMAL HIGH (ref 70–99)

## 2024-04-22 LAB — CBG MONITORING, ED: Glucose-Capillary: 185 mg/dL — ABNORMAL HIGH (ref 70–99)

## 2024-04-22 LAB — CBC
HCT: 35 % — ABNORMAL LOW (ref 36.0–46.0)
Hemoglobin: 10.6 g/dL — ABNORMAL LOW (ref 12.0–15.0)
MCH: 30.7 pg (ref 26.0–34.0)
MCHC: 30.3 g/dL (ref 30.0–36.0)
MCV: 101.4 fL — ABNORMAL HIGH (ref 80.0–100.0)
Platelets: 175 K/uL (ref 150–400)
RBC: 3.45 MIL/uL — ABNORMAL LOW (ref 3.87–5.11)
RDW: 14.9 % (ref 11.5–15.5)
WBC: 7 K/uL (ref 4.0–10.5)
nRBC: 0 % (ref 0.0–0.2)

## 2024-04-22 LAB — COMPREHENSIVE METABOLIC PANEL WITH GFR
ALT: 10 U/L (ref 0–44)
AST: 19 U/L (ref 15–41)
Albumin: 3.5 g/dL (ref 3.5–5.0)
Alkaline Phosphatase: 92 U/L (ref 38–126)
Anion gap: 18 — ABNORMAL HIGH (ref 5–15)
BUN: 41 mg/dL — ABNORMAL HIGH (ref 8–23)
CO2: 25 mmol/L (ref 22–32)
Calcium: 9.8 mg/dL (ref 8.9–10.3)
Chloride: 93 mmol/L — ABNORMAL LOW (ref 98–111)
Creatinine, Ser: 9.97 mg/dL — ABNORMAL HIGH (ref 0.44–1.00)
GFR, Estimated: 4 mL/min — ABNORMAL LOW (ref >60)
Glucose, Bld: 139 mg/dL — ABNORMAL HIGH (ref 70–99)
Potassium: 4.8 mmol/L (ref 3.5–5.1)
Sodium: 136 mmol/L (ref 135–145)
Total Bilirubin: 1 mg/dL (ref 0.0–1.2)
Total Protein: 7.8 g/dL (ref 6.5–8.1)

## 2024-04-22 LAB — TROPONIN I (HIGH SENSITIVITY): Troponin I (High Sensitivity): 12 ng/L (ref <18)

## 2024-04-22 LAB — HEPATITIS B SURFACE ANTIGEN: Hepatitis B Surface Ag: NONREACTIVE

## 2024-04-22 LAB — LIPASE, BLOOD: Lipase: 28 U/L (ref 11–51)

## 2024-04-22 MED ORDER — AMIODARONE HCL IN DEXTROSE 360-4.14 MG/200ML-% IV SOLN
60.0000 mg/h | INTRAVENOUS | Status: AC
  Administered 2024-04-22 (×2): 60 mg/h via INTRAVENOUS
  Filled 2024-04-22 (×2): qty 200

## 2024-04-22 MED ORDER — HEPARIN SODIUM (PORCINE) 1000 UNIT/ML DIALYSIS: 1000.0000 [IU] | INTRAMUSCULAR | Status: DC | PRN

## 2024-04-22 MED ORDER — NEPRO/CARBSTEADY PO LIQD
237.0000 mL | ORAL | Status: DC | PRN
Start: 2024-04-22 — End: 2024-04-23

## 2024-04-22 MED ORDER — ROSUVASTATIN CALCIUM 5 MG PO TABS
10.0000 mg | ORAL_TABLET | Freq: Every day | ORAL | Status: DC
  Administered 2024-04-22 – 2024-04-24 (×3): 10 mg via ORAL
  Filled 2024-04-22 (×3): qty 2

## 2024-04-22 MED ORDER — FENTANYL CITRATE PF 50 MCG/ML IJ SOSY
50.0000 ug | PREFILLED_SYRINGE | Freq: Once | INTRAMUSCULAR | Status: AC
  Administered 2024-04-22: 50 ug via INTRAVENOUS
  Filled 2024-04-22: qty 1

## 2024-04-22 MED ORDER — LIDOCAINE-PRILOCAINE 2.5-2.5 % EX CREA: 1.0000 | TOPICAL_CREAM | CUTANEOUS | Status: DC | PRN

## 2024-04-22 MED ORDER — POLYETHYLENE GLYCOL 3350 17 G PO PACK
17.0000 g | PACK | Freq: Every day | ORAL | Status: DC
  Filled 2024-04-22 (×3): qty 1

## 2024-04-22 MED ORDER — APIXABAN 5 MG PO TABS
5.0000 mg | ORAL_TABLET | Freq: Two times a day (BID) | ORAL | Status: DC
  Administered 2024-04-22 – 2024-04-25 (×6): 5 mg via ORAL
  Filled 2024-04-22 (×7): qty 1

## 2024-04-22 MED ORDER — HYDROMORPHONE HCL 1 MG/ML IJ SOLN
1.0000 mg | INTRAMUSCULAR | Status: DC | PRN
  Administered 2024-04-22 – 2024-04-23 (×6): 1 mg via INTRAVENOUS
  Filled 2024-04-22 (×7): qty 1

## 2024-04-22 MED ORDER — PANTOPRAZOLE SODIUM 40 MG PO TBEC
40.0000 mg | DELAYED_RELEASE_TABLET | Freq: Every day | ORAL | Status: DC
  Administered 2024-04-22 – 2024-04-25 (×3): 40 mg via ORAL
  Filled 2024-04-22 (×4): qty 1

## 2024-04-22 MED ORDER — AMIODARONE LOAD VIA INFUSION
150.0000 mg | Freq: Once | INTRAVENOUS | Status: AC
  Administered 2024-04-22: 150 mg via INTRAVENOUS
  Filled 2024-04-22: qty 83.34

## 2024-04-22 MED ORDER — DULOXETINE HCL 60 MG PO CPEP
60.0000 mg | ORAL_CAPSULE | Freq: Every day | ORAL | Status: DC
  Administered 2024-04-22 – 2024-04-25 (×4): 60 mg via ORAL
  Filled 2024-04-22 (×4): qty 1

## 2024-04-22 MED ORDER — HEPARIN SODIUM (PORCINE) 1000 UNIT/ML IJ SOLN
INTRAMUSCULAR | Status: AC
Start: 2024-04-22 — End: 2024-04-22
  Filled 2024-04-22: qty 3

## 2024-04-22 MED ORDER — SODIUM CHLORIDE 0.9% FLUSH
3.0000 mL | Freq: Two times a day (BID) | INTRAVENOUS | Status: DC
  Administered 2024-04-22 – 2024-04-24 (×4): 3 mL via INTRAVENOUS

## 2024-04-22 MED ORDER — HEPARIN SODIUM (PORCINE) 1000 UNIT/ML DIALYSIS
2000.0000 [IU] | Freq: Once | INTRAMUSCULAR | Status: AC
  Administered 2024-04-22: 2000 [IU] via INTRAVENOUS_CENTRAL

## 2024-04-22 MED ORDER — BUSPIRONE HCL 5 MG PO TABS
5.0000 mg | ORAL_TABLET | Freq: Two times a day (BID) | ORAL | Status: DC
  Administered 2024-04-22 – 2024-04-25 (×6): 5 mg via ORAL
  Filled 2024-04-22 (×7): qty 1

## 2024-04-22 MED ORDER — MIDODRINE HCL 5 MG PO TABS
10.0000 mg | ORAL_TABLET | Freq: Two times a day (BID) | ORAL | Status: DC
  Administered 2024-04-22 – 2024-04-25 (×6): 10 mg via ORAL
  Filled 2024-04-22 (×7): qty 2

## 2024-04-22 MED ORDER — ANTICOAGULANT SODIUM CITRATE 4% (200MG/5ML) IV SOLN: 5.0000 mL | Status: DC | PRN

## 2024-04-22 MED ORDER — DILTIAZEM LOAD VIA INFUSION
10.0000 mg | Freq: Once | INTRAVENOUS | Status: AC
  Administered 2024-04-22: 10 mg via INTRAVENOUS
  Filled 2024-04-22: qty 10

## 2024-04-22 MED ORDER — INSULIN ASPART 100 UNIT/ML IJ SOLN
0.0000 [IU] | Freq: Three times a day (TID) | INTRAMUSCULAR | Status: DC
  Administered 2024-04-22 (×2): 1 [IU] via SUBCUTANEOUS
  Administered 2024-04-22: 2 [IU] via SUBCUTANEOUS
  Administered 2024-04-23 – 2024-04-24 (×3): 1 [IU] via SUBCUTANEOUS
  Administered 2024-04-24: 2 [IU] via SUBCUTANEOUS

## 2024-04-22 MED ORDER — DILTIAZEM HCL-DEXTROSE 125-5 MG/125ML-% IV SOLN (PREMIX)
5.0000 mg/h | INTRAVENOUS | Status: DC
  Administered 2024-04-22: 5 mg/h via INTRAVENOUS
  Filled 2024-04-22: qty 125

## 2024-04-22 MED ORDER — AMIODARONE HCL IN DEXTROSE 360-4.14 MG/200ML-% IV SOLN
30.0000 mg/h | INTRAVENOUS | Status: DC
  Administered 2024-04-22 – 2024-04-23 (×2): 30 mg/h via INTRAVENOUS
  Filled 2024-04-22 (×3): qty 200

## 2024-04-22 MED ORDER — SENNA 8.6 MG PO TABS
1.0000 | ORAL_TABLET | Freq: Every day | ORAL | Status: DC
  Administered 2024-04-22 – 2024-04-23 (×2): 8.6 mg via ORAL
  Filled 2024-04-22 (×2): qty 1

## 2024-04-22 MED ORDER — HEPARIN SODIUM (PORCINE) 1000 UNIT/ML IJ SOLN
INTRAMUSCULAR | Status: AC
  Filled 2024-04-22: qty 3

## 2024-04-22 MED ORDER — HYDROMORPHONE HCL 1 MG/ML IJ SOLN: 0.5000 mg | INTRAMUSCULAR | Status: DC | PRN

## 2024-04-22 MED ORDER — ONDANSETRON HCL 4 MG/2ML IJ SOLN
4.0000 mg | Freq: Once | INTRAMUSCULAR | Status: AC
  Administered 2024-04-22: 4 mg via INTRAVENOUS
  Filled 2024-04-22: qty 2

## 2024-04-22 MED ORDER — PENTAFLUOROPROP-TETRAFLUOROETH EX AERO: 1.0000 | INHALATION_SPRAY | CUTANEOUS | Status: DC | PRN

## 2024-04-22 MED ORDER — ALTEPLASE 2 MG IJ SOLR: 2.0000 mg | Freq: Once | INTRAMUSCULAR | Status: DC | PRN

## 2024-04-22 MED ORDER — LIDOCAINE HCL (PF) 1 % IJ SOLN
5.0000 mL | INTRAMUSCULAR | Status: DC | PRN
Start: 2024-04-22 — End: 2024-04-23

## 2024-04-22 MED ORDER — GABAPENTIN 100 MG PO CAPS
100.0000 mg | ORAL_CAPSULE | ORAL | Status: DC
  Administered 2024-04-22 – 2024-04-24 (×2): 100 mg via ORAL
  Filled 2024-04-22 (×2): qty 1

## 2024-04-22 MED ORDER — TRIMETHOBENZAMIDE HCL 100 MG/ML IM SOLN
200.0000 mg | Freq: Once | INTRAMUSCULAR | Status: AC | PRN
  Administered 2024-04-22: 200 mg via INTRAMUSCULAR
  Filled 2024-04-22: qty 2

## 2024-04-22 NOTE — Progress Notes (Addendum)
 Patient received from ED, she is alert and oriented, CCMD notified, CHG bath given, had no any skin issues, complaint of back pain, will notify MD, all needs met, call bell in reach.     04/22/24 0801  Vitals  Temp 97.6 F (36.4 C)  Temp Source Oral  BP 124/85  MAP (mmHg) 96  BP Location Right Wrist  BP Method Automatic  Patient Position (if appropriate) Lying  Pulse Rate 70  Pulse Rate Source Monitor  ECG Heart Rate 85  Resp 15  MEWS COLOR  MEWS Score Color Green  Oxygen  Therapy  SpO2 96 %  O2 Device Room Air  Pain Assessment  Pain Scale 0-10  Pain Score 10  Pain Type Acute pain  Pain Location Back  Pain Orientation Mid;Lower  Pain Intervention(s) MD notified (Comment)  MEWS Score  MEWS Temp 0  MEWS Systolic 0  MEWS Pulse 0  MEWS RR 0  MEWS LOC 0  MEWS Score 0

## 2024-04-22 NOTE — Consult Note (Signed)
 ELECTROPHYSIOLOGY CONSULT NOTE    Patient ID: [name] MRN: [id number], DOB/AGE: 1956-01-04 68 y.o.  Admit date: 04/22/2024 Date of Consult: 04/22/2024  Primary Physician: Celestia Rosaline SQUIBB, NP Primary Cardiologist: Shelda Bruckner, MD  Electrophysiologist: Dr. Cindie   Referring Provider: Dr. Fairy  Patient Profile: [name] is a 68 y.o. female with a history of severe MR s/p Mitral & Triscupid valvular ring annuloplasty, L & R sided MAZE procedure with clipping of LAAO in 2022, ESRD on HD who is being seen today for the evaluation of AFL at the request of Dr. Fairy.  HPI:  [name] is a 68 y.o. female who presented to [facility] ER on 04/22/24 with intractable back pain.   Recent admit from 8/2 -03/29/24 for hypotension and tachycardia during HD. 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  During the admit, she had a DCCV to restore NSR and had a leadless PPM placed on 03/27/24 per Dr. Kennyth.  Amiodarone  was adjusted. Plan was for AV node ablation if atrial arrhythmias returned.   On admit 04/22/24 she reported shortness of breath with exertion and that she felt as thought she was back in AF. She denies missing doses of eliquis .     She denies chest pain, PND, orthopnea, nausea, vomiting, dizziness, syncope, edema, weight gain, or early satiety.   Labs Potassium4.8 (09/02 0158)   Creatinine, ser  9.97* (09/02 0158) PLT  175 (09/02 0158) HGB  10.6* (09/02 0158) WBC 7.0 (09/02 0158) Troponin I (High Sensitivity)12 (09/02 0158).    Past Medical History:  Diagnosis Date   Acute on chronic systolic CHF (congestive heart failure) (HCC) 12/21/2020   Allergic rhinitis    Anemia    Arthritis    Asthma    Atrial fibrillation with RVR (HCC) 11/09/2023   Chronic diastolic CHF (congestive heart failure) (HCC)    Chronic kidney disease    Dialysis Tu/TH/Sa   COPD (chronic obstructive pulmonary disease) (HCC)    COVID-19 03/28/2023    Depression    DM (diabetes mellitus) (HCC)    DVT (deep vein thrombosis) in pregnancy    Gastroenteritis 03/27/2023   GERD (gastroesophageal reflux disease)    HCAP (healthcare-associated pneumonia) 06/21/2022   HTN (hypertension)    Hyperlipidemia    Obesity    Pneumonia 04/2018   RIGHT LOBE   Sleep apnea    needs to be retested - to get new cpap     Surgical History:  Past Surgical History:  Procedure Laterality Date   ABDOMINAL HYSTERECTOMY     AV FISTULA PLACEMENT Left 03/17/2022   Procedure: LEFT ARM ARTERIOVENOUS (AV) FISTULA CREATION;  Surgeon: Eliza Bruckner RAMAN, MD;  Location: [facility] OR;  Service: Vascular;  Laterality: Left;   BIOPSY  01/12/2023   Procedure: BIOPSY;  Surgeon: Rollin Dover, MD;  Location: [facility];  Service: Gastroenterology;;   VASSIE STUDY  11/26/2020   Procedure: BUBBLE STUDY;  Surgeon: Bruckner Shelda, MD;  Location: [facility] ENDOSCOPY;  Service: Cardiovascular;;   CARDIOVERSION N/A 05/06/2020   Procedure: CARDIOVERSION;  Surgeon: Bruckner Shelda, MD;  Location: [facility] ENDOSCOPY;  Service: Cardiovascular;  Laterality: N/A;   CARDIOVERSION N/A 11/04/2020   Procedure: CARDIOVERSION;  Surgeon: Pietro Redell RAMAN, MD;  Location: [facility] ENDOSCOPY;  Service: Cardiovascular;  Laterality: N/A;   CARDIOVERSION N/A 02/01/2021   Procedure: CARDIOVERSION;  Surgeon: Cherrie Toribio SAUNDERS, MD;  Location: [facility] ENDOSCOPY;  Service: Cardiovascular;  Laterality: N/A;   CARDIOVERSION N/A 02/09/2021   Procedure: CARDIOVERSION;  Surgeon: Bensimhon,  Toribio SAUNDERS, MD;  Location: [facility] ENDOSCOPY;  Service: Cardiovascular;  Laterality: N/A;   CARDIOVERSION N/A 04/19/2021   Procedure: CARDIOVERSION;  Surgeon: Okey Vina GAILS, MD;  Location: [facility] ENDOSCOPY;  Service: Cardiovascular;  Laterality: N/A;   CARDIOVERSION N/A 11/25/2021   Procedure: CARDIOVERSION;  Surgeon: Cherrie Toribio SAUNDERS, MD;  Location: [facility] ENDOSCOPY;  Service: Cardiovascular;  Laterality: N/A;   CARDIOVERSION N/A 01/31/2022    Procedure: CARDIOVERSION;  Surgeon: Cherrie Toribio SAUNDERS, MD;  Location: [facility] ENDOSCOPY;  Service: Cardiovascular;  Laterality: N/A;   CARDIOVERSION N/A 07/23/2023   Procedure: CARDIOVERSION (CATH LAB);  Surgeon: Francyne Headland, MD;  Location: [facility] INVASIVE CV LAB;  Service: Cardiovascular;  Laterality: N/A;   CARDIOVERSION N/A 12/05/2023   Procedure: CARDIOVERSION;  Surgeon: Cherrie Toribio SAUNDERS, MD;  Location: [facility] INVASIVE CV LAB;  Service: Cardiovascular;  Laterality: N/A;   CATARACT EXTRACTION, BILATERAL     CHOLECYSTECTOMY     COLONOSCOPY WITH PROPOFOL  N/A 03/05/2015   Procedure: COLONOSCOPY WITH PROPOFOL ;  Surgeon: Belvie Just, MD;  Location: [facility] ENDOSCOPY;  Service: Endoscopy;  Laterality: N/A;   DIAGNOSTIC LAPAROSCOPY     ESOPHAGOGASTRODUODENOSCOPY (EGD) WITH PROPOFOL  N/A 01/12/2023   Procedure: ESOPHAGOGASTRODUODENOSCOPY (EGD) WITH PROPOFOL ;  Surgeon: Just Belvie, MD;  Location: [facility] ENDOSCOPY;  Service: Gastroenterology;  Laterality: N/A;   FISTULA SUPERFICIALIZATION Left 03/17/2022   Procedure: FISTULA SUPERFICIALIZATION -LEFT;  Surgeon: Eliza Lonni RAMAN, MD;  Location: [facility] OR;  Service: Vascular;  Laterality: Left;   ingrown hallux Left    IR FLUORO GUIDE CV LINE RIGHT  03/14/2022   IR US  GUIDE VASC ACCESS RIGHT  03/14/2022   KNEE SURGERY     LEFT HEART CATH AND CORONARY ANGIOGRAPHY N/A 12/31/2017   Procedure: LEFT HEART CATH AND CORONARY ANGIOGRAPHY;  Surgeon: Levern Hutching, MD;  Location: [facility] INVASIVE CV LAB;  Service: Cardiovascular;  Laterality: N/A;   MAZE N/A 12/29/2020   Procedure: MAZE;  Surgeon: Kerrin Elspeth BROCKS, MD;  Location: [facility] OR;  Service: Open Heart Surgery;  Laterality: N/A;   MITRAL VALVE REPAIR N/A 12/29/2020   Procedure: MITRAL VALVE REPAIR USING CARBOMEDICS ANNULOFLEX RING SIZE 30;  Surgeon: Kerrin Elspeth BROCKS, MD;  Location: [facility] OR;  Service: Open Heart Surgery;  Laterality: N/A;   PACEMAKER LEADLESS INSERTION N/A 03/27/2024   Procedure: PACEMAKER LEADLESS  INSERTION;  Surgeon: Kennyth Chew, MD;  Location: [facility] INVASIVE CV LAB;  Service: Cardiovascular;  Laterality: N/A;   REVISON OF ARTERIOVENOUS FISTULA Left 01/05/2023   Procedure: REVISION OF LEFT ARTERIOVENOUS FISTULA WITH INTERPOSITION PTFE GRAFT;  Surgeon: Eliza Lonni RAMAN, MD;  Location: [facility] OR;  Service: Vascular;  Laterality: Left;  ELIQUIS  AND HOLD 48 X HOURS   RIGHT HEART CATH N/A 12/22/2020   Procedure: RIGHT HEART CATH;  Surgeon: Rolan Ezra RAMAN, MD;  Location: [facility] INVASIVE CV LAB;  Service: Cardiovascular;  Laterality: N/A;   RIGHT HEART CATH N/A 01/31/2021   Procedure: RIGHT HEART CATH;  Surgeon: Cherrie Toribio SAUNDERS, MD;  Location: [facility] INVASIVE CV LAB;  Service: Cardiovascular;  Laterality: N/A;   RIGHT HEART CATH N/A 07/29/2021   Procedure: RIGHT HEART CATH;  Surgeon: Cherrie Toribio SAUNDERS, MD;  Location: [facility] INVASIVE CV LAB;  Service: Cardiovascular;  Laterality: N/A;   RIGHT HEART CATH N/A 11/24/2021   Procedure: RIGHT HEART CATH;  Surgeon: Cherrie Toribio SAUNDERS, MD;  Location: [facility] INVASIVE CV LAB;  Service: Cardiovascular;  Laterality: N/A;   RIGHT HEART CATH N/A 02/14/2022   Procedure: RIGHT HEART CATH;  Surgeon: Cherrie Toribio SAUNDERS, MD;  Location: [facility]  INVASIVE CV LAB;  Service: Cardiovascular;  Laterality: N/A;   RIGHT HEART CATH N/A 02/22/2022   Procedure: RIGHT HEART CATH;  Surgeon: Rolan Ezra RAMAN, MD;  Location: [facility] INVASIVE CV LAB;  Service: Cardiovascular;  Laterality: N/A;   TEE WITHOUT CARDIOVERSION N/A 11/26/2020   Procedure: TRANSESOPHAGEAL ECHOCARDIOGRAM (TEE);  Surgeon: Lonni Slain, MD;  Location: [facility] ENDOSCOPY;  Service: Cardiovascular;  Laterality: N/A;   TEE WITHOUT CARDIOVERSION N/A 12/29/2020   Procedure: TRANSESOPHAGEAL ECHOCARDIOGRAM (TEE);  Surgeon: Kerrin Elspeth BROCKS, MD;  Location: [facility] OR;  Service: Open Heart Surgery;  Laterality: N/A;   TEE WITHOUT CARDIOVERSION N/A 01/20/2022   Procedure: TRANSESOPHAGEAL ECHOCARDIOGRAM (TEE);  Surgeon: Cherrie Toribio SAUNDERS, MD;   Location: [facility] ENDOSCOPY;  Service: Cardiovascular;  Laterality: N/A;   TRICUSPID VALVE REPLACEMENT N/A 12/29/2020   Procedure: 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 RING SIZE 34;  Surgeon: Kerrin Elspeth BROCKS, MD;  Location: [facility] OR;  Service: Open Heart Surgery;  Laterality: N/A;     Medications Prior to Admission  Medication Sig Dispense Refill Last Dose/Taking   Accu-Chek Softclix Lancets lancets Use to check blood sugar 3 times daily. 100 each 6    acetaminophen  (TYLENOL ) 325 MG tablet Take 2 tablets (650 mg total) by mouth every 6 (six) hours as needed for mild pain or moderate pain.      albuterol  (PROVENTIL  HFA) 108 (90 Base) MCG/ACT inhaler Inhale 1-2 puffs into the lungs every 6 (six) hours as needed for wheezing or shortness of breath. 18 g 2    amiodarone  (PACERONE ) 200 MG tablet Take 1 tablet (200 mg total) by mouth 2 (two) times daily. Follow with cardiology for refills 60 tablet 0    AURYXIA  1 GM 210 MG(Fe) tablet Take 210 mg by mouth 3 (three) times daily.      Blood Glucose Monitoring Suppl (ACCU-CHEK GUIDE) w/Device KIT Use to check blood sugar 3 times daily. 1 kit 0    busPIRone  (BUSPAR ) 5 MG tablet TAKE ONE TABLET BY MOUTH TWICE DAILY @9AM -5PM 180 tablet 1    diphenhydrAMINE  (BENADRYL ) 25 MG tablet Take 25 mg by mouth every 6 (six) hours as needed for itching. (Patient taking differently: Take 25 mg by mouth as needed for itching.)      doxercalciferol  (HECTOROL ) 4 MCG/2ML injection Inject 1.5 mLs (3 mcg total) into the vein Every Tuesday,Thursday,and Saturday with dialysis.      DULoxetine  (CYMBALTA ) 60 MG capsule TAKE ONE CAPSULE (60MG  TOTAL) BY MOUTH DAILY AT 9AM 180 capsule 10    ELIQUIS  5 MG TABS tablet TAKE ONE TABLET (5MG  TOTAL) BY MOUTH TWICE DAILY @9AM -5PM 180 tablet 11    EPINEPHrine  0.3 mg/0.3 mL IJ SOAJ injection Inject 0.3 mg into the muscle as needed for anaphylaxis. 1 each 1    fluticasone  (FLONASE  ALLERGY RELIEF) 50 MCG/ACT nasal spray Place 2 sprays  into both nostrils daily as needed for allergies. (Patient taking differently: Place 2 sprays into both nostrils as needed for allergies.)      gabapentin  (NEURONTIN ) 100 MG capsule Take 1 capsule (100 mg total) by mouth every Tuesday, Thursday, and Saturday at 6 PM. 12 capsule 0    glucose blood (ACCU-CHEK GUIDE TEST) test strip Use to check blood sugar 3 times daily. 100 each 6    insulin  lispro (HUMALOG  KWIKPEN) 100 UNIT/ML KwikPen For blood sugars 0-150 give 0 units of insulin , 151-200 give 2 units of insulin , 201-250 give 4 units, 251-300 give 6 units, 301-350 give 8 units, 351-400 give 10  units,> 400 give 12 units and call M.D. 15 mL 2    lidocaine -prilocaine  (EMLA ) cream Apply 1 Application topically Every Tuesday,Thursday,and Saturday with dialysis.      loratadine  (CLARITIN ) 10 MG tablet TAKE ONE TABLET (10 mg total) BY MOUTH DAILY AT 9AM EVERY MORNING. 90 tablet 0    Methoxy PEG-Epoetin  Beta (MIRCERA IJ) 50 mcg.      metoCLOPramide  (REGLAN ) 5 MG tablet Take 1 tablet (5 mg total) by mouth every 8 (eight) hours as needed for nausea. 30 tablet 0    midodrine  (PROAMATINE ) 10 MG tablet Take 1 tablet (10 mg total) by mouth 2 (two) times daily. 60 tablet 0    Multiple Vitamins-Minerals (MULTIVITAMIN WOMEN PO) Take 1 tablet by mouth daily.      nitroGLYCERIN  (NITROSTAT ) 0.4 MG SL tablet Place 1 tablet (0.4 mg total) under the tongue every 5 (five) minutes x 3 doses as needed for chest pain. 25 tablet 12    oxyCODONE -acetaminophen  (PERCOCET/ROXICET) 5-325 MG tablet Take 1 tablet by mouth every 8 (eight) hours as needed for up to 3 days for severe pain (pain score 7-10). 9 tablet 0    pantoprazole  (PROTONIX ) 40 MG tablet Take 1 tablet (40 mg total) by mouth daily. 90 tablet 0    polyethylene glycol powder (MIRALAX ) 17 GM/SCOOP powder Take 17 g by mouth daily as needed (constipation.).      rosuvastatin  (CRESTOR ) 10 MG tablet TAKE ONE TABLET BY MOUTH DAILY AT 5PM 90 tablet 11     Inpatient  Medications:   apixaban   5 mg Oral BID   busPIRone   5 mg Oral BID   DULoxetine   60 mg Oral Daily   gabapentin   100 mg Oral Q T,Th,Sat-1800   insulin  aspart  0-9 Units Subcutaneous TID WC   midodrine   10 mg Oral BID   pantoprazole   40 mg Oral Daily   polyethylene glycol  17 g Oral Daily   rosuvastatin   10 mg Oral QHS   senna  1 tablet Oral QHS   sodium chloride  flush  3 mL Intravenous Q12H    Allergies:  Allergies  Allergen Reactions   Bee Pollen Anaphylaxis and Other (See Comments)    UNCONFIRMED, but IS allergic to bee VENOM   Bee Venom Anaphylaxis   Sulfa Antibiotics Itching and Rash   Chlorhexidine      Unknown rxn.   Iodinated Contrast Media Nausea And Vomiting    Family History  Problem Relation Age of Onset   Breast cancer Mother    Cancer Mother    Hypertension Mother    Cancer Father    Hypertension Sister    Hypertension Brother    CAD Other      Physical Exam: Vitals:   04/22/24 0801 04/22/24 1023 04/22/24 1143 04/22/24 1500  BP: 124/85 107/75 115/85 108/84  Pulse: 70 97 98 (!) 130  Resp: 15 12 19 20   Temp: 97.6 F (36.4 C)  97.8 F (36.6 C) (!) 97.5 F (36.4 C)  TempSrc: Oral  Oral Oral  SpO2: 96% 96% 96% 100%  Weight:      Height:        GEN- NAD, A&O x 3, normal affect HEENT: Normocephalic, atraumatic Lungs- CTAB, Normal effort.  Heart- intermittently regular mixed with irregularly irregular, No M/G/R.  GI- Soft, NT, ND.  Extremities- No clubbing, cyanosis, or edema   Radiology/Studies: CT ABDOMEN PELVIS WO CONTRAST Result Date: 04/22/2024 EXAM: CT ABDOMEN AND PELVIS WITHOUT CONTRAST 04/22/2024 04:16:18 AM TECHNIQUE: CT of  the abdomen and pelvis was performed without the administration of intravenous contrast. Multiplanar reformatted images are provided for review. Automated exposure control, iterative reconstruction, and/or weight-based adjustment of the mA/kV was utilized to reduce the radiation dose to as low as reasonably achievable.  COMPARISON: CT of the abdomen and pelvis dated 04/19/2024. CLINICAL HISTORY: Abdominal pain, acute, nonlocalized. Back pain s/p fall 5-6 days and was dx with right hip contusion. FINDINGS: LOWER CHEST: There are ground-glass and reticular opacities present dependently within the lower lobes bilaterally. There is mosaic perfusion of the lung bases. LIVER: The liver has a lobular micronodular contour. GALLBLADDER AND BILE DUCTS: The patient is status post cholecystectomy. SPLEEN: Normal size. No focal lesion. PANCREAS: No mass. No ductal dilatation. ADRENAL GLANDS: Normal appearance. No mass. KIDNEYS, URETERS AND BLADDER: No stones in the kidneys or ureters. No hydronephrosis. No perinephric or periureteral stranding. Urinary bladder is unremarkable. GI AND BOWEL: Stomach demonstrates no acute abnormality. There is no bowel obstruction. No bowel wall thickening. PERITONEUM AND RETROPERITONEUM: No ascites. No free air. VASCULATURE: There are peripherally calcified left renal artery aneurysms present, which measure approximately 16 and 14 mm in diameter respectively, similar to the prior exam. LYMPH NODES: No lymphadenopathy. REPRODUCTIVE ORGANS: The patient is status post hysterectomy and bilateral salpingo-oophorectomy. BONES AND SOFT TISSUES: No acute osseous abnormality. No focal soft tissue abnormality. IMPRESSION: 1. Hepatic cirrhosis 2. Peripherally calcified left renal artery aneurysms, measuring approximately 16 and 14 mm in diameter, similar to the prior exam. Electronically signed by: Evalene Coho MD 04/22/2024 04:31 AM EDT RP Workstation: [id number]   DG Pelvis Portable Result Date: 04/22/2024 CLINICAL DATA:  Fall, back pain EXAM: PORTABLE PELVIS 1-2 VIEWS COMPARISON:  None Available. FINDINGS: There is no evidence of pelvic fracture or diastasis. No pelvic bone lesions are seen. IMPRESSION: Negative. Electronically Signed   By: Dorethia Molt M.D.   On: 04/22/2024 02:27   DG Chest Portable 1  View Result Date: 04/22/2024 CLINICAL DATA:  Fall, back pain EXAM: PORTABLE CHEST 1 VIEW COMPARISON:  03/28/2024 FINDINGS: Lung volumes are small. Mild elevation of the right hemidiaphragm. No focal pulmonary infiltrate. No pneumothorax or pleural effusion. Mitral and tricuspid valve replacement and left atrial clipping has been performed. Lead less pacemaker noted. Cardiac size is enlarged, unchanged. Pulmonary vascularity is normal. IMPRESSION: 1. Pulmonary hypoinflation. 2. Stable cardiomegaly. Electronically Signed   By: Dorethia Molt M.D.   On: 04/22/2024 02:26   CT ABDOMEN PELVIS WO CONTRAST Result Date: 04/19/2024 CLINICAL DATA:  Blunt abdominal trauma. Right hip and low back pain. EXAM: CT ABDOMEN AND PELVIS WITHOUT CONTRAST TECHNIQUE: Multidetector CT imaging of the abdomen and pelvis was performed following the standard protocol without IV contrast. RADIATION DOSE REDUCTION: This exam was performed according to the departmental dose-optimization program which includes automated exposure control, adjustment of the mA and/or kV according to patient size and/or use of iterative reconstruction technique. COMPARISON:  Most recent CT 11/09/2023, concurrent lumbar spine reformats, reported separately. FINDINGS: Lower chest: The heart is enlarged. Heterogeneous pulmonary parenchyma, chronic. No basilar pneumothorax or pleural effusion. Hepatobiliary: Assessment for injury is limited in the absence of IV contrast. Homogeneous hepatic attenuation. No perihepatic hematoma. Clips in the gallbladder fossa postcholecystectomy. No biliary dilatation. Pancreas: Assessment for injury is limited in the absence of IV contrast. No ductal dilatation or inflammation. Spleen: Assessment for injury is limited in the absence of IV contrast. Homogeneous attenuation. No perisplenic hematoma. Adrenals/Urinary Tract: Chronic adrenal thickening without adrenal hematoma. Assessment for renal injury is limited  in the absence of IV  contrast. No evidence of renal inflammation or hydronephrosis. Partially calcified left renal artery aneurysm are unchanged appearance from prior, 16 mm anteriorly a and 13 mm posteriorly. The urinary bladder is completely empty and not well assessed. Stomach/Bowel: No evidence of bowel injury or mesenteric hematoma. No bowel wall thickening or inflammatory change. Small to moderate colonic stool burden. Normal appendix. Vascular/Lymphatic: Aortic atherosclerosis. No retroperitoneal fluid to suggest vascular injury. No suspicious lymphadenopathy. Reproductive: Hysterectomy.  Quiescent ovaries. Other: No free fluid. No free air. No confluent body wall contusion. Musculoskeletal: Lumbar spine is assessed on concurrent lumbar spine reformats, reported separately. T11 compression fracture is chronic and unchanged from prior exam. No fracture of the pelvis or right hip. No sacral fracture. No obvious intramuscular hematoma or muscle injury to account for pain. IMPRESSION: 1. No evidence of acute traumatic injury to the abdomen or pelvis. Lumbar spine CT reported separately. 2. Chronic T11 compression fracture. 3. Unchanged left renal artery aneurysms. Aortic Atherosclerosis (ICD10-I70.0). Electronically Signed   By: Andrea Gasman M.D.   On: 04/19/2024 17:11   CT L-SPINE NO CHARGE Result Date: 04/19/2024 CLINICAL DATA:  Right lower back pain after fall. EXAM: CT LUMBAR SPINE WITHOUT CONTRAST TECHNIQUE: Multidetector CT imaging of the lumbar spine was performed without intravenous contrast administration. Multiplanar CT image reconstructions were also generated. RADIATION DOSE REDUCTION: This exam was performed according to the departmental dose-optimization program which includes automated exposure control, adjustment of the mA and/or kV according to patient size and/or use of iterative reconstruction technique. COMPARISON:  Lumbar spine CT three weeks ago 03/28/2024 FINDINGS: Segmentation: 5 lumbar type vertebrae.  Alignment: Normal. Vertebrae: Stable minimal L4 superior endplate compression deformity. No progressive loss of height. There is slight undulation of the superior endplate of L5 that is new from prior exam. Posterior elements are intact. Paraspinal and other soft tissues: Assessed fully on concurrent abdominopelvic CT, reported separately. There is no paravertebral soft tissue thickening or hematoma, including at the L5 level. Disc levels: Multilevel degenerative disc disease and facet hypertrophy. Degenerative changes at L4-L5 causes spinal canal and neural foraminal stenosis. IMPRESSION: 1. Slight undulation of the superior endplate of L5 is new from exam 3 weeks ago. May represent acute/subacute minor compression fracture. There is no significant loss of height. 2. Stable minimal L4 superior endplate compression deformity. 3. Multilevel degenerative disc disease and facet hypertrophy. Degenerative changes at L4-L5 causes spinal canal and neural foraminal stenosis. Electronically Signed   By: Andrea Gasman M.D.   On: 04/19/2024 17:04   CT Cervical Spine Wo Contrast Result Date: 04/19/2024 CLINICAL DATA:  Neck trauma (Age >= 65y) EXAM: CT CERVICAL SPINE WITHOUT CONTRAST TECHNIQUE: Multidetector CT imaging of the cervical spine was performed without intravenous contrast. Multiplanar CT image reconstructions were also generated. RADIATION DOSE REDUCTION: This exam was performed according to the departmental dose-optimization program which includes automated exposure control, adjustment of the mA and/or kV according to patient size and/or use of iterative reconstruction technique. COMPARISON:  06/18/2023 FINDINGS: Alignment: Straightening of normal lordosis. No traumatic subluxation. Skull base and vertebrae: No acute fracture. Vertebral body heights are maintained. The dens and skull base are intact. Non fusion posterior arch of C1, variant anatomy. Soft tissues and spinal canal: No prevertebral fluid or  swelling. No visible canal hematoma. Disc levels:  Mild degenerative changes are stable from prior exam. Upper chest: Nonacute. Other: None. IMPRESSION: Mild degenerative change in the cervical spine without acute fracture or subluxation. Electronically Signed  By: Andrea Gasman M.D.   On: 04/19/2024 17:00   CT Head Wo Contrast Result Date: 04/19/2024 CLINICAL DATA:  Head trauma, minor (Age >= 65y) EXAM: CT HEAD WITHOUT CONTRAST TECHNIQUE: Contiguous axial images were obtained from the base of the skull through the vertex without intravenous contrast. RADIATION DOSE REDUCTION: This exam was performed according to the departmental dose-optimization program which includes automated exposure control, adjustment of the mA and/or kV according to patient size and/or use of iterative reconstruction technique. COMPARISON:  Head CT 06/18/2023 FINDINGS: Brain: No intracranial hemorrhage, mass effect, or midline shift. No hydrocephalus. The basilar cisterns are patent. No evidence of territorial infarct or acute ischemia. No extra-axial or intracranial fluid collection. Vascular: Atherosclerosis of skullbase vasculature without hyperdense vessel or abnormal calcification. Skull: No fracture or focal lesion. Sinuses/Orbits: No acute finding.  Bilateral cataract resection. Other: No confluent scalp hematoma. IMPRESSION: No acute intracranial abnormality. No skull fracture. Electronically Signed   By: Andrea Gasman M.D.   On: 04/19/2024 16:55   DG HIP UNILAT WITH PELVIS 1V RIGHT Result Date: 03/29/2024 CLINICAL DATA:  Right hip pain. Fall a few weeks ago with pain since that time. EXAM: DG HIP (WITH OR WITHOUT PELVIS) 1V RIGHT COMPARISON:  None Available. FINDINGS: No evidence of acute or healing fracture of the pelvis or right hip. No hip dislocation. Pubic rami are intact. No pubic symphyseal or sacroiliac diastasis. Mild hip osteoarthritis with joint space narrowing and spurring. No evidence of erosion or avascular  necrosis. IMPRESSION: 1. No acute or healing fracture of the pelvis or right hip. 2. Mild right hip osteoarthritis. Electronically Signed   By: Andrea Gasman M.D.   On: 03/29/2024 16:06   CT LUMBAR SPINE WO CONTRAST Result Date: 03/28/2024 CLINICAL DATA:  Fall, back pain, right lower extremity weakness and numbness EXAM: CT LUMBAR SPINE WITHOUT CONTRAST TECHNIQUE: Multidetector CT imaging of the lumbar spine was performed without intravenous contrast administration. Multiplanar CT image reconstructions were also generated. RADIATION DOSE REDUCTION: This exam was performed according to the departmental dose-optimization program which includes automated exposure control, adjustment of the mA and/or kV according to patient size and/or use of iterative reconstruction technique. COMPARISON:  None Available. FINDINGS: Segmentation: 5 lumbar type vertebrae. Alignment: Normal. Vertebrae: Probable slight compression through the superior endplate of L4. This is new since prior CT from 06/18/2023, but appears similar to prior plain films from 03/22/2024. Paraspinal and other soft tissues: Negative paraspinal soft tissues. 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 Early degenerative spurring. Moderate degenerative facet disease most pronounced in the lower lumbar spine. No focal disc herniation. IMPRESSION: Suspect slight compression fracture through the superior endplate of L4, new since 06/18/2023, but appears similar to prior plain films from 03/22/2024. Electronically Signed   By: Franky Crease M.D.   On: 03/28/2024 16:18   DG Chest 2 View Result Date: 03/28/2024 CLINICAL DATA:  Pacemaker placement. EXAM: CHEST - 2 VIEW COMPARISON:  03/22/2024 FINDINGS: Low volume lordotic frontal projection. The cardio pericardial silhouette is enlarged. Lead less pacemaker overlies the left heart, new in the interval. Vascular congestion without overt edema or focal airspace  consolidation. No pneumothorax. Telemetry leads overlie the chest. IMPRESSION: Interval placement of leadless pacemaker. No pneumothorax. Electronically Signed   By: Camellia Candle M.D.   On: 03/28/2024 05:58   EP PPM/ICD IMPLANT Result Date: 03/27/2024  CONCLUSIONS:  1. Successful single chamber leadless pacemaker implant.  2. No early apparent complications. Fonda  Kennyth, MD, [facility], [facility] Cardiac Electrophysiology    EKG: 04/19/24 AFL (personally reviewed)  TELEMETRY: AF 90-100's vacillating with AFL 120-130's (personally reviewed)   DEVICE HISTORY:  MDT Micra AV Leadless implanted 03/27/24   Assessment/Plan:  Persistent Atrial Fibrillation / Atrial Flutter  Tachy-Brady Syndrome s/p MDT Leadless PPM -NPO after MN for possible AV node ablation on 9/3 > reviewed with patient that the schedule may not allow and may have to be on 9/3 -continue amiodarone  at 30 mg/hr for now  -continue Eliquis    ESRD on HD  HD T/Th/S -Nephrology note reflects pt will get HD this evening, latest tomorrow am (9/3) -pt is under dry wt by 5kg per Nephrology   Chronic Hypotension  -continue midodrine     For questions or updates, please contact [facility] Please consult [email or web address] for contact info under     Signed, Daphne Barrack, NP-C, AGACNP-BC [facility] - Electrophysiology  04/22/2024, 3:41 PM

## 2024-04-22 NOTE — ED Provider Notes (Signed)
 [facility] EMERGENCY DEPARTMENT AT [facility] Provider Note   CSN: [id number] Arrival date & time: 04/22/24  0133     Patient presents with: Back Pain   [name] is a 68 y.o. female.   The history is provided by the patient.  Patient with extensive history including ESRD on dialysis Tuesday Thursday Saturday, A-fib with RVR currently on anticoagulation, asthma, chronic CHF presents with multiple complaints. Patient was seen in the ER on August 30.  She was seen after a fall 2 days prior to that visit.  She had hip pain at that time.  She had extensive evaluation at that time was found to have an old compression fracture.  Patient was given pain medicines at that time.  After discharge she reports her low back pain is worsened and she is now having abdominal pain.  She is able to ambulate but it hurts.  No new falls.  She is also reporting shortness of breath and chest pain.  No fecal incontinence.  No fevers.   Denies previous back surgery She has pain meds at home but it is not helping her symptoms  She does not have urine output at baseline Past Medical History:  Diagnosis Date   Acute on chronic systolic CHF (congestive heart failure) (HCC) 12/21/2020   Allergic rhinitis    Anemia    Arthritis    Asthma    Atrial fibrillation with RVR (HCC) 11/09/2023   Chronic diastolic CHF (congestive heart failure) (HCC)    Chronic kidney disease    Dialysis Tu/TH/Sa   COPD (chronic obstructive pulmonary disease) (HCC)    COVID-19 03/28/2023   Depression    DM (diabetes mellitus) (HCC)    DVT (deep vein thrombosis) in pregnancy    Gastroenteritis 03/27/2023   GERD (gastroesophageal reflux disease)    HCAP (healthcare-associated pneumonia) 06/21/2022   HTN (hypertension)    Hyperlipidemia    Obesity    Pneumonia 04/2018   RIGHT LOBE   Sleep apnea    needs to be retested - to get new cpap    Prior to Admission medications   Medication Sig Start Date End  Date Taking? Authorizing Provider  Accu-Chek Softclix Lancets lancets Use to check blood sugar 3 times daily. 01/01/24   Newlin, Enobong, MD  acetaminophen  (TYLENOL ) 325 MG tablet Take 2 tablets (650 mg total) by mouth every 6 (six) hours as needed for mild pain or moderate pain. 12/07/21   Arrien, Mauricio Daniel, MD  albuterol  (PROVENTIL  HFA) 108 [phone] Base) MCG/ACT inhaler Inhale 1-2 puffs into the lungs every 6 (six) hours as needed for wheezing or shortness of breath. 10/13/22   Celestia Rosaline SQUIBB, NP  amiodarone  (PACERONE ) 200 MG tablet Take 1 tablet (200 mg total) by mouth 2 (two) times daily. Follow with cardiology for refills 03/29/24   Perri DELENA Meliton Mickey., MD  AURYXIA  1 GM 210 MG(Fe) tablet Take 210 mg by mouth 3 (three) times daily.    [provider]  Blood Glucose Monitoring Suppl (ACCU-CHEK GUIDE) w/Device KIT Use to check blood sugar 3 times daily. 01/01/24   Newlin, Enobong, MD  busPIRone  (BUSPAR ) 5 MG tablet TAKE ONE TABLET BY MOUTH TWICE DAILY @9AM -5PM 02/14/24   Celestia Rosaline SQUIBB, NP  diphenhydrAMINE  (BENADRYL ) 25 MG tablet Take 25 mg by mouth every 6 (six) hours as needed for itching. Patient taking differently: Take 25 mg by mouth as needed for itching. 03/31/22   [provider]  doxercalciferol  (HECTOROL ) 4 MCG/2ML  injection Inject 1.5 mLs (3 mcg total) into the vein Every Tuesday,Thursday,and Saturday with dialysis. 03/06/24   Amin, Ankit C, MD  DULoxetine  (CYMBALTA ) 60 MG capsule TAKE ONE CAPSULE (60MG  TOTAL) BY MOUTH DAILY AT 9AM 02/29/24   Celestia Rosaline SQUIBB, NP  ELIQUIS  5 MG TABS tablet TAKE ONE TABLET (5MG  TOTAL) BY MOUTH TWICE DAILY @9AM -5PM 09/06/23   Bensimhon, Toribio SAUNDERS, MD  EPINEPHrine  0.3 mg/0.3 mL IJ SOAJ injection Inject 0.3 mg into the muscle as needed for anaphylaxis. 06/25/20   Celestia Rosaline SQUIBB, NP  fluticasone  (FLONASE  ALLERGY RELIEF) 50 MCG/ACT nasal spray Place 2 sprays into both nostrils daily as needed for allergies. Patient taking differently:  Place 2 sprays into both nostrils as needed for allergies. 03/31/22   [provider]  gabapentin  (NEURONTIN ) 100 MG capsule Take 1 capsule (100 mg total) by mouth every Tuesday, Thursday, and Saturday at 6 PM. 03/29/24 04/28/24  Perri DELENA Meliton Mickey., MD  glucose blood (ACCU-CHEK GUIDE TEST) test strip Use to check blood sugar 3 times daily. 01/01/24   Newlin, Enobong, MD  insulin  lispro (HUMALOG  KWIKPEN) 100 UNIT/ML KwikPen For blood sugars 0-150 give 0 units of insulin , 151-200 give 2 units of insulin , 201-250 give 4 units, 251-300 give 6 units, 301-350 give 8 units, 351-400 give 10 units,> 400 give 12 units and call M.D. 09/27/23   Newlin, Enobong, MD  lidocaine -prilocaine  (EMLA ) cream Apply 1 Application topically Every Tuesday,Thursday,and Saturday with dialysis. 06/01/22   [provider]  loratadine  (CLARITIN ) 10 MG tablet TAKE ONE TABLET (10 mg total) BY MOUTH DAILY AT 9AM EVERY MORNING. 04/01/24   Celestia Rosaline SQUIBB, NP  Methoxy PEG-Epoetin  Beta (MIRCERA IJ) 50 mcg. 01/29/24 01/27/25  [provider]  metoCLOPramide  (REGLAN ) 5 MG tablet Take 1 tablet (5 mg total) by mouth every 8 (eight) hours as needed for nausea. 07/10/23   Celestia Rosaline SQUIBB, NP  midodrine  (PROAMATINE ) 10 MG tablet Take 1 tablet (10 mg total) by mouth 2 (two) times daily. 03/05/24   Amin, Ankit C, MD  Multiple Vitamins-Minerals (MULTIVITAMIN WOMEN PO) Take 1 tablet by mouth daily.    [provider]  nitroGLYCERIN  (NITROSTAT ) 0.4 MG SL tablet Place 1 tablet (0.4 mg total) under the tongue every 5 (five) minutes x 3 doses as needed for chest pain. 11/08/23   Lonni Slain, MD  oxyCODONE -acetaminophen  (PERCOCET/ROXICET) 5-325 MG tablet Take 1 tablet by mouth every 8 (eight) hours as needed for up to 3 days for severe pain (pain score 7-10). 04/19/24 04/22/24  Pamella Ozell DELENA, DO  pantoprazole  (PROTONIX ) 40 MG tablet Take 1 tablet (40 mg total) by mouth daily. 04/24/23   Celestia Rosaline SQUIBB, NP   polyethylene glycol powder (MIRALAX ) 17 GM/SCOOP powder Take 17 g by mouth daily as needed (constipation.). 06/26/22   [provider]  rosuvastatin  (CRESTOR ) 10 MG tablet TAKE ONE TABLET BY MOUTH DAILY AT 5PM 04/04/24   Bensimhon, Toribio SAUNDERS, MD    Allergies: Bee pollen, Bee venom, Sulfa antibiotics, Chlorhexidine , and Iodinated contrast media    Review of Systems  Constitutional:  Negative for fever.  Respiratory:  Positive for shortness of breath.   Cardiovascular:  Positive for chest pain.  Musculoskeletal:  Positive for back pain. Negative for neck pain.  Neurological:  Positive for weakness.    Updated Vital Signs BP 104/70   Pulse 88   Temp 98.3 F (36.8 C) (Oral)   Resp 14   Ht 1.702 m (5' 7)   Wt 117.9 kg  SpO2 92%   BMI 40.72 kg/m   Physical Exam CONSTITUTIONAL: Ill-appearing HEAD: Normocephalic/atraumatic EYES: EOMI/PERRL ENMT: Mucous membranes moist NECK: supple no meningeal signs SPINE/BACK: No cervical or thoracic tenderness, lower lumbar tenderness noted No bruising/crepitance/stepoffs noted to spine CV: Tachycardic and irregular LUNGS: Lungs are clear to auscultation bilaterally, no apparent distress ABDOMEN: soft, obesity limits exam, mild lower abdominal tenderness GU:no cva tenderness NEURO: Pt is awake/alert/appropriate, moves all extremitiesx4.  No facial droop.  She is able to move all 4 extremities with equal strength but is slow to move due to pain. EXTREMITIES: pulses normal/equal, full ROM, diffuse tenderness with range of motion of both hips but there is no deformities.  Mild tenderness to proximal thighs No signs any upper extremity trauma All other extremities/joints palpated/ranged and nontender Dialysis access to left UE with thrill noted SKIN: warm, color normal PSYCH: no abnormalities of mood noted, alert and oriented to situation  (all labs ordered are listed, but only abnormal results are displayed) Labs Reviewed  CBC -  Abnormal; Notable for the following components:      Result Value   RBC 3.45 (*)    Hemoglobin 10.6 (*)    HCT 35.0 (*)    MCV 101.4 (*)    All other components within normal limits  COMPREHENSIVE METABOLIC PANEL WITH GFR - Abnormal; Notable for the following components:   Chloride 93 (*)    Glucose, Bld 139 (*)    BUN 41 (*)    Creatinine, Ser 9.97 (*)    GFR, Estimated 4 (*)    Anion gap 18 (*)    All other components within normal limits  LIPASE, BLOOD  TROPONIN I (HIGH SENSITIVITY)    EKG: ED ECG REPORT   Date: 04/22/2024 0150am  Rate: 145  Rhythm: atrial flutter  QRS Axis: normal  Intervals: normal  ST/T Wave abnormalities: nonspecific ST changes  Conduction Disutrbances:none   I have personally reviewed the EKG tracing and disagree with the computerized printout as noted.    Radiology: CT ABDOMEN PELVIS WO CONTRAST Result Date: 04/22/2024 EXAM: CT ABDOMEN AND PELVIS WITHOUT CONTRAST 04/22/2024 04:16:18 AM TECHNIQUE: CT of the abdomen and pelvis was performed without the administration of intravenous contrast. Multiplanar reformatted images are provided for review. Automated exposure control, iterative reconstruction, and/or weight-based adjustment of the mA/kV was utilized to reduce the radiation dose to as low as reasonably achievable. COMPARISON: CT of the abdomen and pelvis dated 04/19/2024. CLINICAL HISTORY: Abdominal pain, acute, nonlocalized. Back pain s/p fall 5-6 days and was dx with right hip contusion. FINDINGS: LOWER CHEST: There are ground-glass and reticular opacities present dependently within the lower lobes bilaterally. There is mosaic perfusion of the lung bases. LIVER: The liver has a lobular micronodular contour. GALLBLADDER AND BILE DUCTS: The patient is status post cholecystectomy. SPLEEN: Normal size. No focal lesion. PANCREAS: No mass. No ductal dilatation. ADRENAL GLANDS: Normal appearance. No mass. KIDNEYS, URETERS AND BLADDER: No stones in the  kidneys or ureters. No hydronephrosis. No perinephric or periureteral stranding. Urinary bladder is unremarkable. GI AND BOWEL: Stomach demonstrates no acute abnormality. There is no bowel obstruction. No bowel wall thickening. PERITONEUM AND RETROPERITONEUM: No ascites. No free air. VASCULATURE: There are peripherally calcified left renal artery aneurysms present, which measure approximately 16 and 14 mm in diameter respectively, similar to the prior exam. LYMPH NODES: No lymphadenopathy. REPRODUCTIVE ORGANS: The patient is status post hysterectomy and bilateral salpingo-oophorectomy. BONES AND SOFT TISSUES: No acute osseous abnormality. No focal soft tissue abnormality.  IMPRESSION: 1. Hepatic cirrhosis 2. Peripherally calcified left renal artery aneurysms, measuring approximately 16 and 14 mm in diameter, similar to the prior exam. Electronically signed by: Evalene Coho MD 04/22/2024 04:31 AM EDT RP Workstation: [id number]   DG Pelvis Portable Result Date: 04/22/2024 CLINICAL DATA:  Fall, back pain EXAM: PORTABLE PELVIS 1-2 VIEWS COMPARISON:  None Available. FINDINGS: There is no evidence of pelvic fracture or diastasis. No pelvic bone lesions are seen. IMPRESSION: Negative. Electronically Signed   By: Dorethia Molt M.D.   On: 04/22/2024 02:27   DG Chest Portable 1 View Result Date: 04/22/2024 CLINICAL DATA:  Fall, back pain EXAM: PORTABLE CHEST 1 VIEW COMPARISON:  03/28/2024 FINDINGS: Lung volumes are small. Mild elevation of the right hemidiaphragm. No focal pulmonary infiltrate. No pneumothorax or pleural effusion. Mitral and tricuspid valve replacement and left atrial clipping has been performed. Lead less pacemaker noted. Cardiac size is enlarged, unchanged. Pulmonary vascularity is normal. IMPRESSION: 1. Pulmonary hypoinflation. 2. Stable cardiomegaly. Electronically Signed   By: Dorethia Molt M.D.   On: 04/22/2024 02:26     .Critical Care  Performed by: Midge Golas, MD Authorized by:  Midge Golas, MD   Critical care provider statement:    Critical care time (minutes):  70   Critical care start time:  04/22/2024 3:30 AM   Critical care end time:  04/22/2024 4:50 AM   Critical care time was exclusive of:  Separately billable procedures and treating other patients   Critical care was necessary to treat or prevent imminent or life-threatening deterioration of the following conditions:  Renal failure and cardiac failure   Critical care was time spent personally by me on the following activities:  Obtaining history from patient or surrogate, examination of patient, evaluation of patient's response to treatment, development of treatment plan with patient or surrogate, pulse oximetry, ordering and review of radiographic studies, re-evaluation of patient's condition, ordering and review of laboratory studies, ordering and performing treatments and interventions and review of old charts   I assumed direction of critical care for this patient from another provider in my specialty: no     Care discussed with: admitting provider      Medications Ordered in the ED  amiodarone  (NEXTERONE ) 1.8 mg/mL load via infusion 150 mg (has no administration in time range)    Followed by  amiodarone  (NEXTERONE  PREMIX) 360-4.14 MG/200ML-% (1.8 mg/mL) IV infusion (has no administration in time range)    Followed by  amiodarone  (NEXTERONE  PREMIX) 360-4.14 MG/200ML-% (1.8 mg/mL) IV infusion (has no administration in time range)  fentaNYL  (SUBLIMAZE ) injection 50 mcg (50 mcg Intravenous Given 04/22/24 0212)  diltiazem  (CARDIZEM ) 1 mg/mL load via infusion 10 mg (10 mg Intravenous Bolus from Bag 04/22/24 0230)  fentaNYL  (SUBLIMAZE ) injection 50 mcg (50 mcg Intravenous Given 04/22/24 0340)    Clinical Course as of 04/22/24 0539  Tue Apr 22, 2024  0215 Patient with extensive history presents with increased low back pain not having abdominal pain.  She had a fall several days ago with extensive evaluation at that  time.  Patient is currently tachycardic and appears to be in atrial flutter rhythm.  Labs and imaging are pending at this time.  Will start with chest x-ray as well as pelvic x-ray.  Patient will likely need CT abdomen pelvis due to abdominal tenderness [DW]  0217 Patient appears to be in atrial flutter, will start IV Cardizem  [DW]  9673 Patient is improving with IV Cardizem .  However she still has lower abdominal pain.  Will obtain CT abdomen pelvis and admit [DW]  0400 Heart rate appears improved, still in A-fib [DW]  0537 Heart rate is much improved after her Cardizem  drip.  CT imaging negative for acute abdominal emergency. Patient still having lower abdominal pain, also reports continued back pain into her right leg. She is able to move the right leg, though limited due to pain. It is possible she has underlying radiculopathy.  She may need MRI imaging, though no emergent condition for MRI or surgery at this time [DW]  [phone] Discussed with Dr. Segars for admission to the hospitalist Patient will also need dialysis [DW]    Clinical Course User Index [DW] Midge Golas, MD                                 Medical Decision Making Amount and/or Complexity of Data Reviewed Labs: ordered. Radiology: ordered. ECG/medicine tests: ordered.  Risk Prescription drug management. Decision regarding hospitalization.   This patient presents to the ED for concern of back pain, this involves an extensive number of treatment options, and is a complaint that carries with it a high risk of complications and morbidity.  The differential diagnosis includes but is not limited to muscle strain, compression fracture, discitis, osteomyelitis, acute myelopathy, AAA  Comorbidities that complicate the patient evaluation: Patient's presentation is complicated by their history of ESRD, atrial fibrillation  Social Determinants of Health: Patient's poor mobility  increases the complexity of managing their  presentation  Additional history obtained: Records reviewed previous records reviewed  Lab Tests: I Ordered, and personally interpreted labs.  The pertinent results include: Chronic renal failure  Imaging Studies ordered: I ordered imaging studies including X-ray chest and pelvis  I independently visualized and interpreted imaging which showed no acute finding I agree with the radiologist interpretation  Cardiac Monitoring: The patient was maintained on a cardiac monitor.  I personally viewed and interpreted the cardiac monitor which showed an underlying rhythm of:  Atrial Flutter  Medicines ordered and prescription drug management: I ordered medication including Cardizem  for atrial flutter Reevaluation of the patient after these medicines showed that the patient    improved   Critical Interventions:   Cardizem  drip for atrial flutter  Consultations Obtained: I requested consultation with the admitting physician [facility], and discussed  findings as well as pertinent plan - they recommend: Admit  Reevaluation: After the interventions noted above, I reevaluated the patient and found that they have :improved  Complexity of problems addressed: Patient's presentation is most consistent with  acute presentation with potential threat to life or bodily function  Disposition: After consideration of the diagnostic results and the patient's response to treatment,  I feel that the patent would benefit from admission  .        Final diagnoses:  Atrial fibrillation with RVR (HCC)  Lumbosacral radiculopathy    ED Discharge Orders     None          Midge Golas, MD 04/22/24 [phone]

## 2024-04-22 NOTE — H&P (Addendum)
 History and Physical    [name]:[id number] DOB: 1956-04-05 DOA: 04/22/2024  PCP: Leslie Rosaline SQUIBB, NP   Patient coming from: Home   Chief Complaint:  Chief Complaint  Patient presents with   Back Pain    HPI:  [name] is a 68 y.o. female with hx of atrial fibrillation/flutter hx of MAZE prior DCCVs, currently on amiodarone , on [facility], with recent hx of leadless PPM placement on 8/7 being considered for AVN ablation, CAD, chronic diastolic CHF, QT prolongation, status post mitral valve and tricuspid valve repair, ESRD on HD, COPD, OSA on CPAP, obesity, Hx DVT, hypertension, hyperlipidemia, diabetes, GERD, anxiety, depression, recent admission with back pain after fall, and Afib/flutter RVR and cardioverted and PPM placed, was found to have a minor superior endplate /compression fracture of L5 in addition to L4 which was previously seen. Returns with intractable back pain   Reports had another fall on 8/30 (was seen ED that same day but on their history had reported 2 days prior) and since this time worsening pain in her mid low back but new radiating pain down the anterolateral aspect of her right thigh and into her right foot. She says that she sometimes has paresthesias in the R outer thigh. No numbness. She is limited due to pain and feels like she cannot function at this point. She has a TLSO brace but stopped wearing it because did not think it was helping. Denies any bowel or bladder incontinence. She has mainly been using tylenol  for pain, had short course of Oxycodone  / percocet on past hospitalization / recent ED visit.   Otherwise able to tell that she is back in Afib and reports shortness of breath with exertion. Denies significant chest pain. She has taken her eliquis  without missing any doses.   Review of Systems:  ROS complete and negative except as marked above   Allergies  Allergen Reactions   Bee Pollen Anaphylaxis and Other (See Comments)     UNCONFIRMED, but IS allergic to bee VENOM   Bee Venom Anaphylaxis   Sulfa Antibiotics Itching and Rash   Chlorhexidine      Unknown rxn.   Iodinated Contrast Media Nausea And Vomiting    Prior to Admission medications   Medication Sig Start Date End Date Taking? Authorizing Provider  Accu-Chek Softclix Lancets lancets Use to check blood sugar 3 times daily. 01/01/24   Newlin, Enobong, MD  acetaminophen  (TYLENOL ) 325 MG tablet Take 2 tablets (650 mg total) by mouth every 6 (six) hours as needed for mild pain or moderate pain. 12/07/21   Arrien, Mauricio Daniel, MD  albuterol  (PROVENTIL  HFA) [phone] Base) MCG/ACT inhaler Inhale 1-2 puffs into the lungs every 6 (six) hours as needed for wheezing or shortness of breath. 10/13/22   Leslie Rosaline SQUIBB, NP  amiodarone  (PACERONE ) 200 MG tablet Take 1 tablet (200 mg total) by mouth 2 (two) times daily. Follow with cardiology for refills 03/29/24   Perri DELENA Meliton Mickey., MD  AURYXIA  1 GM 210 MG(Fe) tablet Take 210 mg by mouth 3 (three) times daily.    [provider]  Blood Glucose Monitoring Suppl (ACCU-CHEK GUIDE) w/Device KIT Use to check blood sugar 3 times daily. 01/01/24   Newlin, Enobong, MD  busPIRone  (BUSPAR ) 5 MG tablet TAKE ONE TABLET BY MOUTH TWICE DAILY @9AM -5PM 02/14/24   Leslie Rosaline SQUIBB, NP  diphenhydrAMINE  (BENADRYL ) 25 MG tablet Take 25 mg by mouth every 6 (six) hours as needed for itching. Patient taking  differently: Take 25 mg by mouth as needed for itching. 03/31/22   [provider]  doxercalciferol  (HECTOROL ) 4 MCG/2ML injection Inject 1.5 mLs (3 mcg total) into the vein Every Tuesday,Thursday,and Saturday with dialysis. 03/06/24   Amin, Ankit C, MD  DULoxetine  (CYMBALTA ) 60 MG capsule TAKE ONE CAPSULE (60MG  TOTAL) BY MOUTH DAILY AT 9AM 02/29/24   Leslie Rosaline SQUIBB, NP  ELIQUIS  5 MG TABS tablet TAKE ONE TABLET (5MG  TOTAL) BY MOUTH TWICE DAILY @9AM -5PM 09/06/23   Bensimhon, Toribio SAUNDERS, MD  EPINEPHrine  0.3 mg/0.3 mL IJ  SOAJ injection Inject 0.3 mg into the muscle as needed for anaphylaxis. 06/25/20   Leslie Rosaline SQUIBB, NP  fluticasone  (FLONASE  ALLERGY RELIEF) 50 MCG/ACT nasal spray Place 2 sprays into both nostrils daily as needed for allergies. Patient taking differently: Place 2 sprays into both nostrils as needed for allergies. 03/31/22   [provider]  gabapentin  (NEURONTIN ) 100 MG capsule Take 1 capsule (100 mg total) by mouth every Tuesday, Thursday, and Saturday at 6 PM. 03/29/24 04/28/24  Perri DELENA Meliton Mickey., MD  glucose blood (ACCU-CHEK GUIDE TEST) test strip Use to check blood sugar 3 times daily. 01/01/24   Newlin, Enobong, MD  insulin  lispro (HUMALOG  KWIKPEN) 100 UNIT/ML KwikPen For blood sugars 0-150 give 0 units of insulin , 151-200 give 2 units of insulin , 201-250 give 4 units, 251-300 give 6 units, 301-350 give 8 units, 351-400 give 10 units,> 400 give 12 units and call M.D. 09/27/23   Newlin, Enobong, MD  lidocaine -prilocaine  (EMLA ) cream Apply 1 Application topically Every Tuesday,Thursday,and Saturday with dialysis. 06/01/22   [provider]  loratadine  (CLARITIN ) 10 MG tablet TAKE ONE TABLET (10 mg total) BY MOUTH DAILY AT 9AM EVERY MORNING. 04/01/24   Leslie Rosaline SQUIBB, NP  Methoxy PEG-Epoetin  Beta (MIRCERA IJ) 50 mcg. 01/29/24 01/27/25  [provider]  metoCLOPramide  (REGLAN ) 5 MG tablet Take 1 tablet (5 mg total) by mouth every 8 (eight) hours as needed for nausea. 07/10/23   Leslie Rosaline SQUIBB, NP  midodrine  (PROAMATINE ) 10 MG tablet Take 1 tablet (10 mg total) by mouth 2 (two) times daily. 03/05/24   Amin, Ankit C, MD  Multiple Vitamins-Minerals (MULTIVITAMIN WOMEN PO) Take 1 tablet by mouth daily.    [provider]  nitroGLYCERIN  (NITROSTAT ) 0.4 MG SL tablet Place 1 tablet (0.4 mg total) under the tongue every 5 (five) minutes x 3 doses as needed for chest pain. 11/08/23   Lonni Slain, MD  oxyCODONE -acetaminophen  (PERCOCET/ROXICET) 5-325 MG tablet  Take 1 tablet by mouth every 8 (eight) hours as needed for up to 3 days for severe pain (pain score 7-10). 04/19/24 04/22/24  Pamella Ozell DELENA, DO  pantoprazole  (PROTONIX ) 40 MG tablet Take 1 tablet (40 mg total) by mouth daily. 04/24/23   Leslie Rosaline SQUIBB, NP  polyethylene glycol powder (MIRALAX ) 17 GM/SCOOP powder Take 17 g by mouth daily as needed (constipation.). 06/26/22   [provider]  rosuvastatin  (CRESTOR ) 10 MG tablet TAKE ONE TABLET BY MOUTH DAILY AT 5PM 04/04/24   Bensimhon, Toribio SAUNDERS, MD    Past Medical History:  Diagnosis Date   Acute on chronic systolic CHF (congestive heart failure) (HCC) 12/21/2020   Allergic rhinitis    Anemia    Arthritis    Asthma    Atrial fibrillation with RVR (HCC) 11/09/2023   Chronic diastolic CHF (congestive heart failure) (HCC)    Chronic kidney disease    Dialysis Tu/TH/Sa   COPD (chronic obstructive pulmonary disease) (HCC)  COVID-19 03/28/2023   Depression    DM (diabetes mellitus) (HCC)    DVT (deep vein thrombosis) in pregnancy    Gastroenteritis 03/27/2023   GERD (gastroesophageal reflux disease)    HCAP (healthcare-associated pneumonia) 06/21/2022   HTN (hypertension)    Hyperlipidemia    Obesity    Pneumonia 04/2018   RIGHT LOBE   Sleep apnea    needs to be retested - to get new cpap    Past Surgical History:  Procedure Laterality Date   ABDOMINAL HYSTERECTOMY     AV FISTULA PLACEMENT Left 03/17/2022   Procedure: LEFT ARM ARTERIOVENOUS (AV) FISTULA CREATION;  Surgeon: Eliza Lonni RAMAN, MD;  Location: [facility] OR;  Service: Vascular;  Laterality: Left;   BIOPSY  01/12/2023   Procedure: BIOPSY;  Surgeon: Rollin Dover, MD;  Location: [facility];  Service: Gastroenterology;;   VASSIE STUDY  11/26/2020   Procedure: BUBBLE STUDY;  Surgeon: Lonni Slain, MD;  Location: [facility] ENDOSCOPY;  Service: Cardiovascular;;   CARDIOVERSION N/A 05/06/2020   Procedure: CARDIOVERSION;  Surgeon: Lonni Slain, MD;   Location: [facility] ENDOSCOPY;  Service: Cardiovascular;  Laterality: N/A;   CARDIOVERSION N/A 11/04/2020   Procedure: CARDIOVERSION;  Surgeon: Pietro Redell RAMAN, MD;  Location: [facility] ENDOSCOPY;  Service: Cardiovascular;  Laterality: N/A;   CARDIOVERSION N/A 02/01/2021   Procedure: CARDIOVERSION;  Surgeon: Cherrie Toribio SAUNDERS, MD;  Location: [facility] ENDOSCOPY;  Service: Cardiovascular;  Laterality: N/A;   CARDIOVERSION N/A 02/09/2021   Procedure: CARDIOVERSION;  Surgeon: Cherrie Toribio SAUNDERS, MD;  Location: [facility] ENDOSCOPY;  Service: Cardiovascular;  Laterality: N/A;   CARDIOVERSION N/A 04/19/2021   Procedure: CARDIOVERSION;  Surgeon: Okey Vina GAILS, MD;  Location: [facility] ENDOSCOPY;  Service: Cardiovascular;  Laterality: N/A;   CARDIOVERSION N/A 11/25/2021   Procedure: CARDIOVERSION;  Surgeon: Cherrie Toribio SAUNDERS, MD;  Location: [facility] ENDOSCOPY;  Service: Cardiovascular;  Laterality: N/A;   CARDIOVERSION N/A 01/31/2022   Procedure: CARDIOVERSION;  Surgeon: Cherrie Toribio SAUNDERS, MD;  Location: [facility] ENDOSCOPY;  Service: Cardiovascular;  Laterality: N/A;   CARDIOVERSION N/A 07/23/2023   Procedure: CARDIOVERSION (CATH LAB);  Surgeon: Francyne Headland, MD;  Location: [facility] INVASIVE CV LAB;  Service: Cardiovascular;  Laterality: N/A;   CARDIOVERSION N/A 12/05/2023   Procedure: CARDIOVERSION;  Surgeon: Cherrie Toribio SAUNDERS, MD;  Location: [facility] INVASIVE CV LAB;  Service: Cardiovascular;  Laterality: N/A;   CATARACT EXTRACTION, BILATERAL     CHOLECYSTECTOMY     COLONOSCOPY WITH PROPOFOL  N/A 03/05/2015   Procedure: COLONOSCOPY WITH PROPOFOL ;  Surgeon: Dover Rollin, MD;  Location: [facility] ENDOSCOPY;  Service: Endoscopy;  Laterality: N/A;   DIAGNOSTIC LAPAROSCOPY     ESOPHAGOGASTRODUODENOSCOPY (EGD) WITH PROPOFOL  N/A 01/12/2023   Procedure: ESOPHAGOGASTRODUODENOSCOPY (EGD) WITH PROPOFOL ;  Surgeon: Rollin Dover, MD;  Location: [facility] ENDOSCOPY;  Service: Gastroenterology;  Laterality: N/A;   FISTULA SUPERFICIALIZATION Left 03/17/2022   Procedure: FISTULA  SUPERFICIALIZATION -LEFT;  Surgeon: Eliza Lonni RAMAN, MD;  Location: [facility] OR;  Service: Vascular;  Laterality: Left;   ingrown hallux Left    IR FLUORO GUIDE CV LINE RIGHT  03/14/2022   IR US  GUIDE VASC ACCESS RIGHT  03/14/2022   KNEE SURGERY     LEFT HEART CATH AND CORONARY ANGIOGRAPHY N/A 12/31/2017   Procedure: LEFT HEART CATH AND CORONARY ANGIOGRAPHY;  Surgeon: Levern Hutching, MD;  Location: [facility] INVASIVE CV LAB;  Service: Cardiovascular;  Laterality: N/A;   MAZE N/A 12/29/2020   Procedure: MAZE;  Surgeon: Kerrin Elspeth BROCKS, MD;  Location: [facility] OR;  Service: Open Heart Surgery;  Laterality: N/A;  MITRAL VALVE REPAIR N/A 12/29/2020   Procedure: MITRAL VALVE REPAIR USING CARBOMEDICS ANNULOFLEX RING SIZE 30;  Surgeon: Kerrin Elspeth BROCKS, MD;  Location: [facility] OR;  Service: Open Heart Surgery;  Laterality: N/A;   PACEMAKER LEADLESS INSERTION N/A 03/27/2024   Procedure: PACEMAKER LEADLESS INSERTION;  Surgeon: Kennyth Chew, MD;  Location: [facility] INVASIVE CV LAB;  Service: Cardiovascular;  Laterality: N/A;   REVISON OF ARTERIOVENOUS FISTULA Left 01/05/2023   Procedure: REVISION OF LEFT ARTERIOVENOUS FISTULA WITH INTERPOSITION PTFE GRAFT;  Surgeon: Eliza Lonni RAMAN, MD;  Location: [facility] OR;  Service: Vascular;  Laterality: Left;  ELIQUIS  AND HOLD 48 X HOURS   RIGHT HEART CATH N/A 12/22/2020   Procedure: RIGHT HEART CATH;  Surgeon: Rolan Ezra RAMAN, MD;  Location: [facility] INVASIVE CV LAB;  Service: Cardiovascular;  Laterality: N/A;   RIGHT HEART CATH N/A 01/31/2021   Procedure: RIGHT HEART CATH;  Surgeon: Cherrie Toribio SAUNDERS, MD;  Location: [facility] INVASIVE CV LAB;  Service: Cardiovascular;  Laterality: N/A;   RIGHT HEART CATH N/A 07/29/2021   Procedure: RIGHT HEART CATH;  Surgeon: Cherrie Toribio SAUNDERS, MD;  Location: [facility] INVASIVE CV LAB;  Service: Cardiovascular;  Laterality: N/A;   RIGHT HEART CATH N/A 11/24/2021   Procedure: RIGHT HEART CATH;  Surgeon: Cherrie Toribio SAUNDERS, MD;  Location: [facility] INVASIVE CV LAB;  Service:  Cardiovascular;  Laterality: N/A;   RIGHT HEART CATH N/A 02/14/2022   Procedure: RIGHT HEART CATH;  Surgeon: Cherrie Toribio SAUNDERS, MD;  Location: [facility] INVASIVE CV LAB;  Service: Cardiovascular;  Laterality: N/A;   RIGHT HEART CATH N/A 02/22/2022   Procedure: RIGHT HEART CATH;  Surgeon: Rolan Ezra RAMAN, MD;  Location: [facility] INVASIVE CV LAB;  Service: Cardiovascular;  Laterality: N/A;   TEE WITHOUT CARDIOVERSION N/A 11/26/2020   Procedure: TRANSESOPHAGEAL ECHOCARDIOGRAM (TEE);  Surgeon: Lonni Slain, MD;  Location: [facility] ENDOSCOPY;  Service: Cardiovascular;  Laterality: N/A;   TEE WITHOUT CARDIOVERSION N/A 12/29/2020   Procedure: TRANSESOPHAGEAL ECHOCARDIOGRAM (TEE);  Surgeon: Kerrin Elspeth BROCKS, MD;  Location: [facility] OR;  Service: Open Heart Surgery;  Laterality: N/A;   TEE WITHOUT CARDIOVERSION N/A 01/20/2022   Procedure: TRANSESOPHAGEAL ECHOCARDIOGRAM (TEE);  Surgeon: Cherrie Toribio SAUNDERS, MD;  Location: [facility] ENDOSCOPY;  Service: Cardiovascular;  Laterality: N/A;   TRICUSPID VALVE REPLACEMENT N/A 12/29/2020   Procedure: 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 RING SIZE 34;  Surgeon: Kerrin Elspeth BROCKS, MD;  Location: [facility] OR;  Service: Open Heart Surgery;  Laterality: N/A;     reports that she has never smoked. She has never been exposed to tobacco smoke. She has never used smokeless tobacco. She reports that she does not drink alcohol  and does not use drugs.  Family History  Problem Relation Age of Onset   Breast cancer Mother    Cancer Mother    Hypertension Mother    Cancer Father    Hypertension Sister    Hypertension Brother    CAD Other      Physical Exam: Vitals:   04/22/24 0134 04/22/24 0137 04/22/24 0143  BP:   125/75  Pulse:   (!) 126  Resp:   (!) 25  Temp:   98.3 F (36.8 C)  TempSrc:   Oral  SpO2: 96%  98%  Weight:  117.9 kg   Height:  5' 7 (1.702 m)     Gen: Awake, alert, chronically ill appearing.   CV: Tachycardic, irregular, normal S1, S2, no murmurs   Resp: Normal WOB, CTAB  Abd: Flat, normoactive, nontender MSK: Symmetric, no  edema. Unable to mobilize to examine her back 2/2 pain.  Skin: No rashes or lesions to exposed skin  Neuro: Alert and interactive. Motor is 5/5 throughout lower ext, sensation is intact throughout LE.  Psych: euthymic, appropriate    Data review:   Labs reviewed, notable for:   Creatinine 9.97 (ESRD BUN 41, K4.8, bicarb 25, AG 18 High-sensitivity Trop 12 Blood counts stable from prior  Micro:  Results for orders placed or performed during the hospital encounter of 02/01/24  Resp panel by RT-PCR (RSV, Flu A&B, Covid) Anterior Nasal Swab     Status: None   Collection Time: 02/01/24  2:08 PM   Specimen: Anterior Nasal Swab  Result Value Ref Range Status   SARS Coronavirus 2 by RT PCR NEGATIVE NEGATIVE Final   Influenza A by PCR NEGATIVE NEGATIVE Final   Influenza B by PCR NEGATIVE NEGATIVE Final    Comment: (NOTE) The Xpert Xpress SARS-CoV-2/FLU/RSV plus assay is intended as an aid in the diagnosis of influenza from Nasopharyngeal swab specimens and should not be used as a sole basis for treatment. Nasal washings and aspirates are unacceptable for Xpert Xpress SARS-CoV-2/FLU/RSV testing.  Fact Sheet for Patients: BloggerCourse.com  Fact Sheet for Healthcare Providers: SeriousBroker.it  This test is not yet approved or cleared by the [place]  FDA and has been authorized for detection and/or diagnosis of SARS-CoV-2 by FDA under an Emergency Use Authorization (EUA). This EUA will remain in effect (meaning this test can be used) for the duration of the COVID-19 declaration under Section 564(b)(1) of the Act, 21 U.S.C. section 360bbb-3(b)(1), unless the authorization is terminated or revoked.     Resp Syncytial Virus by PCR NEGATIVE NEGATIVE Final    Comment: (NOTE) Fact Sheet for Patients: BloggerCourse.com  Fact  Sheet for Healthcare Providers: SeriousBroker.it  This test is not yet approved or cleared by the [place]  FDA and has been authorized for detection and/or diagnosis of SARS-CoV-2 by FDA under an Emergency Use Authorization (EUA). This EUA will remain in effect (meaning this test can be used) for the duration of the COVID-19 declaration under Section 564(b)(1) of the Act, 21 U.S.C. section 360bbb-3(b)(1), unless the authorization is terminated or revoked.  Performed at [facility], [address] [place], [place] [address]    *Note: Due to a large number of results and/or encounters for the requested time period, some results have not been displayed. A complete set of results can be found in Results Review.    Imaging reviewed:  CT ABDOMEN PELVIS WO CONTRAST Result Date: 04/22/2024 EXAM: CT ABDOMEN AND PELVIS WITHOUT CONTRAST 04/22/2024 04:16:18 AM TECHNIQUE: CT of the abdomen and pelvis was performed without the administration of intravenous contrast. Multiplanar reformatted images are provided for review. Automated exposure control, iterative reconstruction, and/or weight-based adjustment of the mA/kV was utilized to reduce the radiation dose to as low as reasonably achievable. COMPARISON: CT of the abdomen and pelvis dated 04/19/2024. CLINICAL HISTORY: Abdominal pain, acute, nonlocalized. Back pain s/p fall 5-6 days and was dx with right hip contusion. FINDINGS: LOWER CHEST: There are ground-glass and reticular opacities present dependently within the lower lobes bilaterally. There is mosaic perfusion of the lung bases. LIVER: The liver has a lobular micronodular contour. GALLBLADDER AND BILE DUCTS: The patient is status post cholecystectomy. SPLEEN: Normal size. No focal lesion. PANCREAS: No mass. No ductal dilatation. ADRENAL GLANDS: Normal appearance. No mass. KIDNEYS, URETERS AND BLADDER: No stones in the kidneys or ureters. No hydronephrosis. No  perinephric or periureteral stranding. Urinary bladder is unremarkable. GI AND BOWEL: Stomach demonstrates no acute abnormality. There is no bowel obstruction. No bowel wall thickening. PERITONEUM AND RETROPERITONEUM: No ascites. No free air. VASCULATURE: There are peripherally calcified left renal artery aneurysms present, which measure approximately 16 and 14 mm in diameter respectively, similar to the prior exam. LYMPH NODES: No lymphadenopathy. REPRODUCTIVE ORGANS: The patient is status post hysterectomy and bilateral salpingo-oophorectomy. BONES AND SOFT TISSUES: No acute osseous abnormality. No focal soft tissue abnormality. IMPRESSION: 1. Hepatic cirrhosis 2. Peripherally calcified left renal artery aneurysms, measuring approximately 16 and 14 mm in diameter, similar to the prior exam. Electronically signed by: Evalene Coho MD 04/22/2024 04:31 AM EDT RP Workstation: [id number]   DG Pelvis Portable Result Date: 04/22/2024 CLINICAL DATA:  Fall, back pain EXAM: PORTABLE PELVIS 1-2 VIEWS COMPARISON:  None Available. FINDINGS: There is no evidence of pelvic fracture or diastasis. No pelvic bone lesions are seen. IMPRESSION: Negative. Electronically Signed   By: Dorethia Molt M.D.   On: 04/22/2024 02:27   DG Chest Portable 1 View Result Date: 04/22/2024 CLINICAL DATA:  Fall, back pain EXAM: PORTABLE CHEST 1 VIEW COMPARISON:  03/28/2024 FINDINGS: Lung volumes are small. Mild elevation of the right hemidiaphragm. No focal pulmonary infiltrate. No pneumothorax or pleural effusion. Mitral and tricuspid valve replacement and left atrial clipping has been performed. Lead less pacemaker noted. Cardiac size is enlarged, unchanged. Pulmonary vascularity is normal. IMPRESSION: 1. Pulmonary hypoinflation. 2. Stable cardiomegaly. Electronically Signed   By: Dorethia Molt M.D.   On: 04/22/2024 02:26    EKG: Pending release    ED Course:  Given Dilt 10 mg IV then drip, fentanyl  for  pain.  Assessment/Plan:  68 y.o. 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 She returns with intractable back pain, and found again to be in Afib/flutter with RVR.   Aflutter with RVR  See hx per above; Rate initially in the 140s, had improved rates on diltiazem  drip but with hx of hypotension requiring midodrine , upcoming HD favor switch to amiodarone  bolus and drip.  -- Cardiology consult, appreciate assistance  -- Switch to Amiodarone  150 mg IV and gtt  -- Continue home Eliquis    Intractable back pain  Hx of recent hx superior endplate / minor compression fracture of L4 and L5  She c/o intractable pain, new radiculopathy since fall on 8/30 with radiating pain down the anterolateral aspect of the thigh and into the foot. On exam strength and sensation are intact throughout the lower extremities. CT A/P with no acute findings; I called and spoke with radiologist, re: her clinical presentation with worsening radiculopathy on the R, I/s/o recurrent fall, there are no changes in appearance of the L spine.  -- She has TLSO brace at home, says it was not helping and stopped wearing, can have family member bring from home if possible.  -- Symptomatic management: tylenol  prn mild, methocarbamol for spasm, Oxycodone  2/5 / 5 mg for mod/severe, and  Dilaudid  0.5 mg IV q 4 hr prn for breakthrough  -- Will need to wait 6 weeks after  PPM placement (was on 8/7) prior to pursuing MRI  -- Can touch base with NSGY in AM re: any additional recommendations.   ESRD on HD -Routine nephrology consult in a.m. for HD management  Chronic medical problems:  CAD: On DOAC monotherapy, continue home statin HFpEF: Without acute exacerbation.  Volume management per HD QT prolongation: Noted, avoid QT prolonging medications   History of MV/TV repair: Outpatient follow-up COPD: Nebs as needed OSA: CPAP nightly History of DVT: On AC per above Hx hypotension: Continue home midodrine  twice daily Hyperlipidemia: Continue home rosuvastatin  Diabetes type 2: SSI for sensitive.  Continue home gabapentin  GERD: Continue home PPI Anxiety/depression: Continue home buspirone , duloxetine     Body mass index is 40.72 kg/m.  Morbid obesity would benefit from weight loss outpatient  DVT prophylaxis:  Eliquis  Code Status:  Full Code Diet:  Diet Orders (From admission, onward)    None      Family Communication:  None   Consults:  Cardiololgy   Admission status:   Inpatient, Step Down Unit  Severity of Illness: The appropriate patient status for this patient is INPATIENT. Inpatient status is judged to be reasonable and necessary in order to provide the required intensity of service to ensure the patient's safety. The patient's presenting symptoms, physical exam findings, and initial radiographic and laboratory data in the context of their chronic comorbidities is felt to place them at high risk for further clinical deterioration. Furthermore, it is not anticipated that the patient will be medically stable for discharge from the hospital within 2 midnights of admission.   * I certify that at the point of admission it is my clinical judgment that the patient will require inpatient hospital care spanning beyond 2 midnights from the point of admission due to high intensity of service, high risk for further deterioration and high frequency of  surveillance required.*   Dorn Dawson, MD [facility]  How to contact the [facility] Attending or Consulting provider 7A - 7P or covering provider during after hours 7P -7A, for this patient.  Check the care team in [facility] [facility] and look for a) attending/consulting TRH provider listed and b) the TRH team listed Log into [email or web address] and use [place]'s universal password to access. If you do not have the password, please contact the hospital operator. Locate the TRH provider you are looking for under [facility] and page to a number that you can be directly reached. If you still have difficulty reaching the provider, please page the [facility] (Director on Call) for the Hospitalists listed on amion for assistance.  04/22/2024, 5:00 AM

## 2024-04-22 NOTE — Progress Notes (Signed)
 PROGRESS NOTE    ARLICIA PAQUETTE  FMW:[id number] DOB: [birth year] DOA: 04/22/2024 PCP: Celestia Rosaline SQUIBB, NP    67/F w hx of atrial fibrillation/flutter hx of MAZE prior DCCVs, currently on amiodarone , eliquis , with recent hx of leadless PPM placement on 8/7 being considered for AVN ablation, CAD, chronic diastolic CHF, QT prolongation, status post mitral valve and tricuspid valve repair, ESRD on HD, COPD, OSA on CPAP, obesity, Hx DVT, hypertension, hyperlipidemia, diabetes, GERD, anxiety, depression, recent admission with back pain after fall, and Afib/flutter RVR and cardioverted and PPM placed, was found to have a minor superior endplate /compression fracture of L5 in addition to L4 which was previously seen. Returns with intractable back pain., had another fall on 8/30 since this time worsening pain in her mid low back but new radiating pain down the anterolateral aspect of her right thigh and into her right foot. She is limited due to pain and feels like she cannot function at this point. She has a TLSO brace but stopped wearing it because did not think it was helping.Also reported being back in Afib and reports shortness of breath with exertion.  Subjective: Has severe low back pain after recent fall  Assessment and Plan:  Intractable back pain  Hx of recent hx superior endplate / minor compression fracture of L4 and L5  She c/o intractable pain, new radiculopathy since fall on 8/30 with radiating pain down the anterolateral aspect of the thigh and into the foot. - Exam largely unremarkable, pain with mobility - CT A/P with no acute findings; admitting MD reviewed with radiologist >there are no changes in appearance of the L spine.  -Discussed with EP, need to wait 6 weeks after PPM placement 8/7 to have MRI -Ordered TLSO brace -Supportive care, Tylenol , Robaxin, oxycodone  -Dilaudid  for severe pain -PT OT eval, may need short-term rehab  Aflutter with RVR  See hx per above; Rate  initially in the 140s, had improved rates on diltiazem  drip but with hx of hypotension requiring midodrine , upcoming HD> switched over to amiodarone  gtt. -- Cardiology consult,  - Continue Eliquis    ESRD on HD - Will request nephrology input, due for HD today   CAD: On DOAC monotherapy, continue home statin  HFpEF: Without acute exacerbation.  Volume management per HD  History of MV/TV repair: Outpatient follow-up  COPD: Stable, nebs as needed  OSA: CPAP nightly  History of DVT: On AC per above  Hx hypotension: Continue home regimen of midodrine  twice daily  Diabetes type 2: SSI for sensitive.  Continue home gabapentin   GERD: Continue home PPI  Anxiety/depression: Continue home buspirone , duloxetine     DVT prophylaxis: Apixaban  Code Status: Full code Family Communication: None present Disposition Plan: May need rehab  Consultants:    Procedures:   Antimicrobials:    Objective: Vitals:   04/22/24 0445 04/22/24 0515 04/22/24 0552 04/22/24 0801  BP: 107/75 104/70  124/85  Pulse: (!) 53 88  70  Resp: 13 14  15   Temp:   98.5 F (36.9 C) 97.6 F (36.4 C)  TempSrc:    Oral  SpO2: 97% 92%  96%  Weight:      Height:       No intake or output data in the 24 hours ending 04/22/24 1022 Filed Weights   04/22/24 0137  Weight: 117.9 kg    Examination:  General exam: Appears calm and comfortable obese, chronically ill Respiratory system: Decreased breath sounds at the bases Cardiovascular system: S1 & S2 heard,  regular rhythm Abd: nondistended, soft and nontender.Normal bowel sounds heard. Central nervous system: Moves both lower extremities, sensations intact, limited by pain Extremities: no edema Skin: No rashes Psychiatry:  Mood & affect appropriate.     Data Reviewed:   CBC: Recent Labs  Lab 04/19/24 1535 04/22/24 0158  WBC 5.5 7.0  NEUTROABS 3.3  --   HGB 10.7* 10.6*  HCT 34.0* 35.0*  MCV 100.3* 101.4*  PLT 159 175   Basic Metabolic  Panel: Recent Labs  Lab 04/19/24 1535 04/22/24 0158  NA 139 136  K 3.4* 4.8  CL 94* 93*  CO2 29 25  GLUCOSE 136* 139*  BUN 13 41*  CREATININE 4.84* 9.97*  CALCIUM  10.0 9.8   GFR: Estimated Creatinine Clearance: 7.3 mL/min (A) (by C-G formula based on SCr of 9.97 mg/dL (H)). Liver Function Tests: Recent Labs  Lab 04/22/24 0158  AST 19  ALT 10  ALKPHOS 92  BILITOT 1.0  PROT 7.8  ALBUMIN  3.5   Recent Labs  Lab 04/22/24 0158  LIPASE 28   No results for input(s): AMMONIA in the last 168 hours. Coagulation Profile: No results for input(s): INR, PROTIME in the last 168 hours. Cardiac Enzymes: No results for input(s): CKTOTAL, CKMB, CKMBINDEX, TROPONINI in the last 168 hours. BNP (last 3 results) Recent Labs    12/24/23 1004  PROBNP 8,609.0*   HbA1C: No results for input(s): HGBA1C in the last 72 hours. CBG: Recent Labs  Lab 04/22/24 0740 04/22/24 0808  GLUCAP 185* 145*   Lipid Profile: No results for input(s): CHOL, HDL, LDLCALC, TRIG, CHOLHDL, LDLDIRECT in the last 72 hours. Thyroid  Function Tests: No results for input(s): TSH, T4TOTAL, FREET4, T3FREE, THYROIDAB in the last 72 hours. Anemia Panel: No results for input(s): VITAMINB12, FOLATE, FERRITIN, TIBC, IRON , RETICCTPCT in the last 72 hours. Urine analysis:    Component Value Date/Time   COLORURINE AMBER (A) 11/07/2022 1447   APPEARANCEUR CLOUDY (A) 11/07/2022 1447   LABSPEC 1.015 11/07/2022 1447   PHURINE 5.0 11/07/2022 1447   GLUCOSEU NEGATIVE 11/07/2022 1447   HGBUR SMALL (A) 11/07/2022 1447   BILIRUBINUR small (A) 02/21/2023 1748   KETONESUR trace (5) (A) 02/21/2023 1748   KETONESUR NEGATIVE 11/07/2022 1447   PROTEINUR =100 (A) 02/21/2023 1748   PROTEINUR 100 (A) 11/07/2022 1447   UROBILINOGEN 0.2 02/21/2023 1748   UROBILINOGEN 1.0 10/26/2014 0249   NITRITE Negative 02/21/2023 1748   NITRITE NEGATIVE 11/07/2022 1447   LEUKOCYTESUR Negative  02/21/2023 1748   LEUKOCYTESUR TRACE (A) 11/07/2022 1447   Sepsis Labs: @LABRCNTIP (procalcitonin:4,lacticidven:4)  )No results found for this or any previous visit (from the past 240 hours).   Radiology Studies: CT ABDOMEN PELVIS WO CONTRAST Result Date: 04/22/2024 EXAM: CT ABDOMEN AND PELVIS WITHOUT CONTRAST 04/22/2024 04:16:18 AM TECHNIQUE: CT of the abdomen and pelvis was performed without the administration of intravenous contrast. Multiplanar reformatted images are provided for review. Automated exposure control, iterative reconstruction, and/or weight-based adjustment of the mA/kV was utilized to reduce the radiation dose to as low as reasonably achievable. COMPARISON: CT of the abdomen and pelvis dated 04/19/2024. CLINICAL HISTORY: Abdominal pain, acute, nonlocalized. Back pain s/p fall 5-6 days and was dx with right hip contusion. FINDINGS: LOWER CHEST: There are ground-glass and reticular opacities present dependently within the lower lobes bilaterally. There is mosaic perfusion of the lung bases. LIVER: The liver has a lobular micronodular contour. GALLBLADDER AND BILE DUCTS: The patient is status post cholecystectomy. SPLEEN: Normal size. No focal lesion. PANCREAS: No mass.  No ductal dilatation. ADRENAL GLANDS: Normal appearance. No mass. KIDNEYS, URETERS AND BLADDER: No stones in the kidneys or ureters. No hydronephrosis. No perinephric or periureteral stranding. Urinary bladder is unremarkable. GI AND BOWEL: Stomach demonstrates no acute abnormality. There is no bowel obstruction. No bowel wall thickening. PERITONEUM AND RETROPERITONEUM: No ascites. No free air. VASCULATURE: There are peripherally calcified left renal artery aneurysms present, which measure approximately 16 and 14 mm in diameter respectively, similar to the prior exam. LYMPH NODES: No lymphadenopathy. REPRODUCTIVE ORGANS: The patient is status post hysterectomy and bilateral salpingo-oophorectomy. BONES AND SOFT TISSUES: No  acute osseous abnormality. No focal soft tissue abnormality. IMPRESSION: 1. Hepatic cirrhosis 2. Peripherally calcified left renal artery aneurysms, measuring approximately 16 and 14 mm in diameter, similar to the prior exam. Electronically signed by: Evalene Coho MD 04/22/2024 04:31 AM EDT RP Workstation: [id number]   DG Pelvis Portable Result Date: 04/22/2024 CLINICAL DATA:  Fall, back pain EXAM: PORTABLE PELVIS 1-2 VIEWS COMPARISON:  None Available. FINDINGS: There is no evidence of pelvic fracture or diastasis. No pelvic bone lesions are seen. IMPRESSION: Negative. Electronically Signed   By: Dorethia Molt M.D.   On: 04/22/2024 02:27   DG Chest Portable 1 View Result Date: 04/22/2024 CLINICAL DATA:  Fall, back pain EXAM: PORTABLE CHEST 1 VIEW COMPARISON:  03/28/2024 FINDINGS: Lung volumes are small. Mild elevation of the right hemidiaphragm. No focal pulmonary infiltrate. No pneumothorax or pleural effusion. Mitral and tricuspid valve replacement and left atrial clipping has been performed. Lead less pacemaker noted. Cardiac size is enlarged, unchanged. Pulmonary vascularity is normal. IMPRESSION: 1. Pulmonary hypoinflation. 2. Stable cardiomegaly. Electronically Signed   By: Dorethia Molt M.D.   On: 04/22/2024 02:26     Scheduled Meds:  apixaban   5 mg Oral BID   busPIRone   5 mg Oral BID   DULoxetine   60 mg Oral Daily   gabapentin   100 mg Oral Q T,Th,Sat-1800   insulin  aspart  0-9 Units Subcutaneous TID WC   midodrine   10 mg Oral BID   pantoprazole   40 mg Oral Daily   polyethylene glycol  17 g Oral Daily   rosuvastatin   10 mg Oral QHS   senna  1 tablet Oral QHS   sodium chloride  flush  3 mL Intravenous Q12H   Continuous Infusions:  amiodarone  60 mg/hr (04/22/24 0949)   Followed by   amiodarone        LOS: 0 days    Time spent:    Sigurd Pac, MD [facility]   04/22/2024, 10:22 AM

## 2024-04-22 NOTE — Consult Note (Signed)
 Renal Service Consult Note [facility] Leslie JONETTA Fret, MD  Patient: [name] Date: 04/22/2024 Requesting Physician: Dr. Fairy  Reason for Consult: ESRD pt w/ back pain after a fall  HPI: The patient is a 68 y.o. year-old w/ PMH as below who presented to ED c/o a fall 5-6 days ago and now has some hip pain and back pain worsening over the last 1-2 days.  In the ED blood pressure 125/88, HR 115, RR 13-15, temp 98.3.  96% O2 sat on room air.  K+ three 4.8, BUN 41, creatinine 9.9.  LFTs okay.  Hgb 10.6, WBC 7K.  CXR showed hyperinflation, no active disease.  EKG showed atrial flutter with RVR in the 140s.  IV diltiazem  was started and patient was admitted, cardiology consulted.  We are asked to see for dialysis.   Pt seen in room. Main c/o her back hurting. Denies any SOB, leg swelling. Last HD on Saturday.    ROS - denies CP, no joint pain, no HA, no blurry vision, no rash, no diarrhea, no nausea/ vomiting   Past Medical History  Past Medical History:  Diagnosis Date   Acute on chronic systolic CHF (congestive heart failure) (HCC) 12/21/2020   Allergic rhinitis    Anemia    Arthritis    Asthma    Atrial fibrillation with RVR (HCC) 11/09/2023   Chronic diastolic CHF (congestive heart failure) (HCC)    Chronic kidney disease    Dialysis Tu/TH/Sa   COPD (chronic obstructive pulmonary disease) (HCC)    COVID-19 03/28/2023   Depression    DM (diabetes mellitus) (HCC)    DVT (deep vein thrombosis) in pregnancy    Gastroenteritis 03/27/2023   GERD (gastroesophageal reflux disease)    HCAP (healthcare-associated pneumonia) 06/21/2022   HTN (hypertension)    Hyperlipidemia    Obesity    Pneumonia 04/2018   RIGHT LOBE   Sleep apnea    needs to be retested - to get new cpap   Past Surgical History  Past Surgical History:  Procedure Laterality Date   ABDOMINAL HYSTERECTOMY     AV FISTULA PLACEMENT Left 03/17/2022   Procedure: LEFT ARM ARTERIOVENOUS  (AV) FISTULA CREATION;  Surgeon: Eliza Lonni RAMAN, MD;  Location: [facility] OR;  Service: Vascular;  Laterality: Left;   BIOPSY  01/12/2023   Procedure: BIOPSY;  Surgeon: Rollin Dover, MD;  Location: [facility];  Service: Gastroenterology;;   VASSIE STUDY  11/26/2020   Procedure: BUBBLE STUDY;  Surgeon: Lonni Slain, MD;  Location: [facility] ENDOSCOPY;  Service: Cardiovascular;;   CARDIOVERSION N/A 05/06/2020   Procedure: CARDIOVERSION;  Surgeon: Lonni Slain, MD;  Location: [facility] ENDOSCOPY;  Service: Cardiovascular;  Laterality: N/A;   CARDIOVERSION N/A 11/04/2020   Procedure: CARDIOVERSION;  Surgeon: Pietro Redell RAMAN, MD;  Location: [facility] ENDOSCOPY;  Service: Cardiovascular;  Laterality: N/A;   CARDIOVERSION N/A 02/01/2021   Procedure: CARDIOVERSION;  Surgeon: Cherrie Toribio SAUNDERS, MD;  Location: [facility] ENDOSCOPY;  Service: Cardiovascular;  Laterality: N/A;   CARDIOVERSION N/A 02/09/2021   Procedure: CARDIOVERSION;  Surgeon: Cherrie Toribio SAUNDERS, MD;  Location: [facility] ENDOSCOPY;  Service: Cardiovascular;  Laterality: N/A;   CARDIOVERSION N/A 04/19/2021   Procedure: CARDIOVERSION;  Surgeon: Okey Vina GAILS, MD;  Location: [facility] ENDOSCOPY;  Service: Cardiovascular;  Laterality: N/A;   CARDIOVERSION N/A 11/25/2021   Procedure: CARDIOVERSION;  Surgeon: Cherrie Toribio SAUNDERS, MD;  Location: [facility] ENDOSCOPY;  Service: Cardiovascular;  Laterality: N/A;   CARDIOVERSION N/A 01/31/2022   Procedure: CARDIOVERSION;  Surgeon: Cherrie Toribio  R, MD;  Location: [facility] ENDOSCOPY;  Service: Cardiovascular;  Laterality: N/A;   CARDIOVERSION N/A 07/23/2023   Procedure: CARDIOVERSION (CATH LAB);  Surgeon: Francyne Headland, MD;  Location: [facility] INVASIVE CV LAB;  Service: Cardiovascular;  Laterality: N/A;   CARDIOVERSION N/A 12/05/2023   Procedure: CARDIOVERSION;  Surgeon: Cherrie Toribio SAUNDERS, MD;  Location: [facility] INVASIVE CV LAB;  Service: Cardiovascular;  Laterality: N/A;   CATARACT EXTRACTION, BILATERAL     CHOLECYSTECTOMY     COLONOSCOPY  WITH PROPOFOL  N/A 03/05/2015   Procedure: COLONOSCOPY WITH PROPOFOL ;  Surgeon: Belvie Just, MD;  Location: [facility] ENDOSCOPY;  Service: Endoscopy;  Laterality: N/A;   DIAGNOSTIC LAPAROSCOPY     ESOPHAGOGASTRODUODENOSCOPY (EGD) WITH PROPOFOL  N/A 01/12/2023   Procedure: ESOPHAGOGASTRODUODENOSCOPY (EGD) WITH PROPOFOL ;  Surgeon: Just Belvie, MD;  Location: [facility] ENDOSCOPY;  Service: Gastroenterology;  Laterality: N/A;   FISTULA SUPERFICIALIZATION Left 03/17/2022   Procedure: FISTULA SUPERFICIALIZATION -LEFT;  Surgeon: Eliza Lonni RAMAN, MD;  Location: [facility] OR;  Service: Vascular;  Laterality: Left;   ingrown hallux Left    IR FLUORO GUIDE CV LINE RIGHT  03/14/2022   IR US  GUIDE VASC ACCESS RIGHT  03/14/2022   KNEE SURGERY     LEFT HEART CATH AND CORONARY ANGIOGRAPHY N/A 12/31/2017   Procedure: LEFT HEART CATH AND CORONARY ANGIOGRAPHY;  Surgeon: Levern Hutching, MD;  Location: [facility] INVASIVE CV LAB;  Service: Cardiovascular;  Laterality: N/A;   MAZE N/A 12/29/2020   Procedure: MAZE;  Surgeon: Kerrin Elspeth BROCKS, MD;  Location: [facility] OR;  Service: Open Heart Surgery;  Laterality: N/A;   MITRAL VALVE REPAIR N/A 12/29/2020   Procedure: MITRAL VALVE REPAIR USING CARBOMEDICS ANNULOFLEX RING SIZE 30;  Surgeon: Kerrin Elspeth BROCKS, MD;  Location: [facility] OR;  Service: Open Heart Surgery;  Laterality: N/A;   PACEMAKER LEADLESS INSERTION N/A 03/27/2024   Procedure: PACEMAKER LEADLESS INSERTION;  Surgeon: Kennyth Chew, MD;  Location: [facility] INVASIVE CV LAB;  Service: Cardiovascular;  Laterality: N/A;   REVISON OF ARTERIOVENOUS FISTULA Left 01/05/2023   Procedure: REVISION OF LEFT ARTERIOVENOUS FISTULA WITH INTERPOSITION PTFE GRAFT;  Surgeon: Eliza Lonni RAMAN, MD;  Location: [facility] OR;  Service: Vascular;  Laterality: Left;  ELIQUIS  AND HOLD 48 X HOURS   RIGHT HEART CATH N/A 12/22/2020   Procedure: RIGHT HEART CATH;  Surgeon: Rolan Ezra RAMAN, MD;  Location: [facility] INVASIVE CV LAB;  Service: Cardiovascular;  Laterality: N/A;   RIGHT  HEART CATH N/A 01/31/2021   Procedure: RIGHT HEART CATH;  Surgeon: Cherrie Toribio SAUNDERS, MD;  Location: [facility] INVASIVE CV LAB;  Service: Cardiovascular;  Laterality: N/A;   RIGHT HEART CATH N/A 07/29/2021   Procedure: RIGHT HEART CATH;  Surgeon: Cherrie Toribio SAUNDERS, MD;  Location: [facility] INVASIVE CV LAB;  Service: Cardiovascular;  Laterality: N/A;   RIGHT HEART CATH N/A 11/24/2021   Procedure: RIGHT HEART CATH;  Surgeon: Cherrie Toribio SAUNDERS, MD;  Location: [facility] INVASIVE CV LAB;  Service: Cardiovascular;  Laterality: N/A;   RIGHT HEART CATH N/A 02/14/2022   Procedure: RIGHT HEART CATH;  Surgeon: Cherrie Toribio SAUNDERS, MD;  Location: [facility] INVASIVE CV LAB;  Service: Cardiovascular;  Laterality: N/A;   RIGHT HEART CATH N/A 02/22/2022   Procedure: RIGHT HEART CATH;  Surgeon: Rolan Ezra RAMAN, MD;  Location: [facility] INVASIVE CV LAB;  Service: Cardiovascular;  Laterality: N/A;   TEE WITHOUT CARDIOVERSION N/A 11/26/2020   Procedure: TRANSESOPHAGEAL ECHOCARDIOGRAM (TEE);  Surgeon: Lonni Slain, MD;  Location: [facility] ENDOSCOPY;  Service: Cardiovascular;  Laterality: N/A;   TEE WITHOUT CARDIOVERSION N/A 12/29/2020  Procedure: TRANSESOPHAGEAL ECHOCARDIOGRAM (TEE);  Surgeon: Kerrin Elspeth BROCKS, MD;  Location: [facility] OR;  Service: Open Heart Surgery;  Laterality: N/A;   TEE WITHOUT CARDIOVERSION N/A 01/20/2022   Procedure: TRANSESOPHAGEAL ECHOCARDIOGRAM (TEE);  Surgeon: Cherrie Toribio SAUNDERS, MD;  Location: [facility] ENDOSCOPY;  Service: Cardiovascular;  Laterality: N/A;   TRICUSPID VALVE REPLACEMENT N/A 12/29/2020   Procedure: 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 RING SIZE 34;  Surgeon: Kerrin Elspeth BROCKS, MD;  Location: [facility] OR;  Service: Open Heart Surgery;  Laterality: N/A;   Family History  Family History  Problem Relation Age of Onset   Breast cancer Mother    Cancer Mother    Hypertension Mother    Cancer Father    Hypertension Sister    Hypertension Brother    CAD Other    Social History  reports that she has  never smoked. She has never been exposed to tobacco smoke. She has never used smokeless tobacco. She reports that she does not drink alcohol  and does not use drugs. Allergies  Allergies  Allergen Reactions   Bee Pollen Anaphylaxis and Other (See Comments)    UNCONFIRMED, but IS allergic to bee VENOM   Bee Venom Anaphylaxis   Sulfa Antibiotics Itching and Rash   Chlorhexidine      Unknown rxn.   Iodinated Contrast Media Nausea And Vomiting   Home medications Prior to Admission medications   Medication Sig Start Date End Date Taking? Authorizing Provider  Accu-Chek Softclix Lancets lancets Use to check blood sugar 3 times daily. 01/01/24   Newlin, Enobong, MD  acetaminophen  (TYLENOL ) 325 MG tablet Take 2 tablets (650 mg total) by mouth every 6 (six) hours as needed for mild pain or moderate pain. 12/07/21   Arrien, Mauricio Daniel, MD  albuterol  (PROVENTIL  HFA) 108 (90 Base) MCG/ACT inhaler Inhale 1-2 puffs into the lungs every 6 (six) hours as needed for wheezing or shortness of breath. 10/13/22   Celestia Rosaline SQUIBB, NP  amiodarone  (PACERONE ) 200 MG tablet Take 1 tablet (200 mg total) by mouth 2 (two) times daily. Follow with cardiology for refills 03/29/24   Perri DELENA Meliton Mickey., MD  AURYXIA  1 GM 210 MG(Fe) tablet Take 210 mg by mouth 3 (three) times daily.    [provider]  Blood Glucose Monitoring Suppl (ACCU-CHEK GUIDE) w/Device KIT Use to check blood sugar 3 times daily. 01/01/24   Newlin, Enobong, MD  busPIRone  (BUSPAR ) 5 MG tablet TAKE ONE TABLET BY MOUTH TWICE DAILY @9AM -5PM 02/14/24   Celestia Rosaline SQUIBB, NP  diphenhydrAMINE  (BENADRYL ) 25 MG tablet Take 25 mg by mouth every 6 (six) hours as needed for itching. Patient taking differently: Take 25 mg by mouth as needed for itching. 03/31/22   [provider]  doxercalciferol  (HECTOROL ) 4 MCG/2ML injection Inject 1.5 mLs (3 mcg total) into the vein Every Tuesday,Thursday,and Saturday with dialysis. 03/06/24   Amin, Ankit C,  MD  DULoxetine  (CYMBALTA ) 60 MG capsule TAKE ONE CAPSULE (60MG  TOTAL) BY MOUTH DAILY AT 9AM 02/29/24   Celestia Rosaline SQUIBB, NP  ELIQUIS  5 MG TABS tablet TAKE ONE TABLET (5MG  TOTAL) BY MOUTH TWICE DAILY @9AM -5PM 09/06/23   Bensimhon, Toribio SAUNDERS, MD  EPINEPHrine  0.3 mg/0.3 mL IJ SOAJ injection Inject 0.3 mg into the muscle as needed for anaphylaxis. 06/25/20   Celestia Rosaline SQUIBB, NP  fluticasone  (FLONASE  ALLERGY RELIEF) 50 MCG/ACT nasal spray Place 2 sprays into both nostrils daily as needed for allergies. Patient taking differently: Place 2 sprays into both nostrils as needed  for allergies. 03/31/22   [provider]  gabapentin  (NEURONTIN ) 100 MG capsule Take 1 capsule (100 mg total) by mouth every Tuesday, Thursday, and Saturday at 6 PM. 03/29/24 04/28/24  Perri DELENA Meliton Mickey., MD  glucose blood (ACCU-CHEK GUIDE TEST) test strip Use to check blood sugar 3 times daily. 01/01/24   Newlin, Enobong, MD  insulin  lispro (HUMALOG  KWIKPEN) 100 UNIT/ML KwikPen For blood sugars 0-150 give 0 units of insulin , 151-200 give 2 units of insulin , 201-250 give 4 units, 251-300 give 6 units, 301-350 give 8 units, 351-400 give 10 units,> 400 give 12 units and call M.D. 09/27/23   Newlin, Enobong, MD  lidocaine -prilocaine  (EMLA ) cream Apply 1 Application topically Every Tuesday,Thursday,and Saturday with dialysis. 06/01/22   [provider]  loratadine  (CLARITIN ) 10 MG tablet TAKE ONE TABLET (10 mg total) BY MOUTH DAILY AT 9AM EVERY MORNING. 04/01/24   Celestia Rosaline SQUIBB, NP  Methoxy PEG-Epoetin  Beta (MIRCERA IJ) 50 mcg. 01/29/24 01/27/25  [provider]  metoCLOPramide  (REGLAN ) 5 MG tablet Take 1 tablet (5 mg total) by mouth every 8 (eight) hours as needed for nausea. 07/10/23   Celestia Rosaline SQUIBB, NP  midodrine  (PROAMATINE ) 10 MG tablet Take 1 tablet (10 mg total) by mouth 2 (two) times daily. 03/05/24   Amin, Ankit C, MD  Multiple Vitamins-Minerals (MULTIVITAMIN WOMEN PO) Take 1 tablet by mouth daily.     [provider]  nitroGLYCERIN  (NITROSTAT ) 0.4 MG SL tablet Place 1 tablet (0.4 mg total) under the tongue every 5 (five) minutes x 3 doses as needed for chest pain. 11/08/23   Lonni Slain, MD  oxyCODONE -acetaminophen  (PERCOCET/ROXICET) 5-325 MG tablet Take 1 tablet by mouth every 8 (eight) hours as needed for up to 3 days for severe pain (pain score 7-10). 04/19/24 04/22/24  Pamella Ozell DELENA, DO  pantoprazole  (PROTONIX ) 40 MG tablet Take 1 tablet (40 mg total) by mouth daily. 04/24/23   Celestia Rosaline SQUIBB, NP  polyethylene glycol powder (MIRALAX ) 17 GM/SCOOP powder Take 17 g by mouth daily as needed (constipation.). 06/26/22   [provider]  rosuvastatin  (CRESTOR ) 10 MG tablet TAKE ONE TABLET BY MOUTH DAILY AT 5PM 04/04/24   Bensimhon, Toribio SAUNDERS, MD     Vitals:   04/22/24 9447 04/22/24 0801 04/22/24 1023 04/22/24 1143  BP:  124/85 107/75 115/85  Pulse:  70 97 98  Resp:  15 12 19   Temp: 98.5 F (36.9 C) 97.6 F (36.4 C)  97.8 F (36.6 C)  TempSrc:  Oral  Oral  SpO2:  96% 96% 96%  Weight:      Height:       Exam Gen alert, no distress, obese, pleasant No rash, cyanosis or gangrene Sclera anicteric, throat clear  No jvd or bruits Chest clear bilat to bases, no rales/ wheezing RRR no MRG Abd soft ntnd no mass or ascites +bs GU deferred MS no joint effusions or deformity Ext no LE or UE edema, no other edema Neuro is alert, Ox 3 , nf    LUA AVF+bruit   Home bp meds: Midodrine  5 mg bid Others: amiodarone , auryxia  1 ac tid, buspar , cymbalta , eliquis , neurontin , insulin ,  reglan , MVI, sl ntg, percocet, PPI, miralax , crestor    OP HD: [facility]   B400   123.8kg   2K bath  AVF  Heparin  2500 Last OP HD 8/30, post HD 122.4kg Usually comes off 2-4 kg over, except last 2 sessions is at or below dry wt post HD Mircera 50  mcg IV q 2 wks, last 8/26, next 9/09    Assessment/ Plan: Atrial flutter w/ RVR: per pmd and cardiology Fall/ back pain: intractable  back pain, minor compression fx of L4+L5. Pain management per pmd.  ESRD: on HD TTS. Last HD Sat. No acute issues. Plan HD tonight if possible, latest tomorrow am.  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 Next esa due 9/09.        Leslie Fret  MD [facility] 04/22/2024, 12:20 PM  Recent Labs  Lab 04/19/24 1535 04/22/24 0158  HGB 10.7* 10.6*  ALBUMIN   --  3.5  CALCIUM  10.0 9.8  CREATININE 4.84* 9.97*  K 3.4* 4.8   Inpatient medications:  apixaban   5 mg Oral BID   busPIRone   5 mg Oral BID   DULoxetine   60 mg Oral Daily   gabapentin   100 mg Oral Q T,Th,Sat-1800   insulin  aspart  0-9 Units Subcutaneous TID WC   midodrine   10 mg Oral BID   pantoprazole   40 mg Oral Daily   polyethylene glycol  17 g Oral Daily   rosuvastatin   10 mg Oral QHS   senna  1 tablet Oral QHS   sodium chloride  flush  3 mL Intravenous Q12H    amiodarone  30 mg/hr (04/22/24 1145)   HYDROmorphone  (DILAUDID ) injection

## 2024-04-22 NOTE — Plan of Care (Signed)
  Problem: Fluid Volume: Goal: Ability to maintain a balanced intake and output will improve Outcome: Progressing   Problem: Coping: Goal: Ability to adjust to condition or change in health will improve Outcome: Progressing   Problem: Metabolic: Goal: Ability to maintain appropriate glucose levels will improve Outcome: Progressing   Problem: Pain Managment: Goal: General experience of comfort will improve and/or be controlled Outcome: Progressing   Problem: Safety: Goal: Ability to remain free from injury will improve Outcome: Progressing

## 2024-04-22 NOTE — Progress Notes (Addendum)
  Educated the patient the need to wear TLSO.  Patient verbalized that she has a TLSO brace at home and will ask her family members to bring it to the hospital asap.   Addendum at 1541: Patient verbalized, her family will not be able to bring the brace today so informed to ortho tech at 1513.

## 2024-04-22 NOTE — Progress Notes (Signed)
 Orthopedic Tech Progress Note Patient Details:  [name] [birth year] [id number]  Ortho Devices Type of Ortho Device: Thoracolumbar corset (TLSO) Ortho Device/Splint Location: BACK Ortho Device/Splint Interventions: Ordered, Adjustment, Other (comment)patient was not feeling well when I went to fit her with back brace, I did make some adjustments not sure if it fits though   Post Interventions Patient Tolerated: Other (comment) Instructions Provided: Care of device  Delanna LITTIE Pac 04/22/2024, 5:05 PM

## 2024-04-22 NOTE — ED Triage Notes (Signed)
 Patient BIB [facility] from home due to back pain. Patient had fall 5-6 days and was dx with right hip contusion prescribed oxy and percocet. Patient states pain got intense today starting in back and radiating to bilateral legs. VSS A&Ox4.

## 2024-04-22 NOTE — Progress Notes (Signed)
 Pt receives out-pt HD at [facility] [place] on TTS 6:45 am chair time. Will assist as needed,   Randine Mungo Dialysis Navigator [phone]

## 2024-04-22 NOTE — Progress Notes (Signed)
Patient off the floor for Hemodialysis.

## 2024-04-23 ENCOUNTER — Inpatient Hospital Stay (HOSPITAL_COMMUNITY)
Admission: EM | Disposition: A | Discharge status: Home / Self Care | Payer: Self-pay | Source: Home / Self Care | Attending: Internal Medicine

## 2024-04-23 DIAGNOSIS — I4891 Unspecified atrial fibrillation: Secondary | ICD-10-CM

## 2024-04-23 DIAGNOSIS — M549 Dorsalgia, unspecified: Secondary | ICD-10-CM | POA: Diagnosis not present

## 2024-04-23 HISTORY — PX: AV NODE ABLATION: EP1193

## 2024-04-23 LAB — CBC
HCT: 35.3 % — ABNORMAL LOW (ref 36.0–46.0)
Hemoglobin: 10.9 g/dL — ABNORMAL LOW (ref 12.0–15.0)
MCH: 31.1 pg (ref 26.0–34.0)
MCHC: 30.9 g/dL (ref 30.0–36.0)
MCV: 100.6 fL — ABNORMAL HIGH (ref 80.0–100.0)
Platelets: 178 K/uL (ref 150–400)
RBC: 3.51 MIL/uL — ABNORMAL LOW (ref 3.87–5.11)
RDW: 15.1 % (ref 11.5–15.5)
WBC: 6.7 K/uL (ref 4.0–10.5)
nRBC: 0 % (ref 0.0–0.2)

## 2024-04-23 LAB — GLUCOSE, CAPILLARY
Glucose-Capillary: 115 mg/dL — ABNORMAL HIGH (ref 70–99)
Glucose-Capillary: 114 mg/dL — ABNORMAL HIGH (ref 70–99)
Glucose-Capillary: 139 mg/dL — ABNORMAL HIGH (ref 70–99)
Glucose-Capillary: 154 mg/dL — ABNORMAL HIGH (ref 70–99)

## 2024-04-23 LAB — BASIC METABOLIC PANEL WITH GFR
Anion gap: 19 — ABNORMAL HIGH (ref 5–15)
BUN: 26 mg/dL — ABNORMAL HIGH (ref 8–23)
CO2: 27 mmol/L (ref 22–32)
Calcium: 9.9 mg/dL (ref 8.9–10.3)
Chloride: 92 mmol/L — ABNORMAL LOW (ref 98–111)
Creatinine, Ser: 6.74 mg/dL — ABNORMAL HIGH (ref 0.44–1.00)
GFR, Estimated: 6 mL/min — ABNORMAL LOW (ref >60)
Glucose, Bld: 117 mg/dL — ABNORMAL HIGH (ref 70–99)
Potassium: 4.5 mmol/L (ref 3.5–5.1)
Sodium: 138 mmol/L (ref 135–145)

## 2024-04-23 SURGERY — AV NODE ABLATION

## 2024-04-23 MED ORDER — FENTANYL CITRATE (PF) 100 MCG/2ML IJ SOLN
INTRAMUSCULAR | Status: DC | PRN
Start: 2024-04-23 — End: 2024-04-23
  Administered 2024-04-23: 25 ug via INTRAVENOUS

## 2024-04-23 MED ORDER — SODIUM CHLORIDE 0.9 % IV SOLN: INTRAVENOUS | Status: DC

## 2024-04-23 MED ORDER — BUPIVACAINE HCL (PF) 0.25 % IJ SOLN
INTRAMUSCULAR | Status: AC
Start: 2024-04-23 — End: 2024-04-23
  Filled 2024-04-23: qty 30

## 2024-04-23 MED ORDER — SODIUM CHLORIDE 0.9 % IV SOLN: 80.0000 mg | INTRAVENOUS | Status: DC

## 2024-04-23 MED ORDER — ONDANSETRON HCL 4 MG/2ML IJ SOLN
4.0000 mg | Freq: Four times a day (QID) | INTRAMUSCULAR | Status: DC | PRN
  Administered 2024-04-23: 4 mg via INTRAVENOUS
  Filled 2024-04-23: qty 2

## 2024-04-23 MED ORDER — MIDAZOLAM HCL 5 MG/5ML IJ SOLN
INTRAMUSCULAR | Status: DC | PRN
  Administered 2024-04-23: 2 mg via INTRAVENOUS

## 2024-04-23 MED ORDER — CHLORHEXIDINE GLUCONATE 4 % EX SOLN: 60.0000 mL | Freq: Once | CUTANEOUS | Status: DC

## 2024-04-23 MED ORDER — FENTANYL CITRATE (PF) 100 MCG/2ML IJ SOLN
INTRAMUSCULAR | Status: AC
Start: 2024-04-23 — End: 2024-04-23
  Filled 2024-04-23: qty 2

## 2024-04-23 MED ORDER — SODIUM CHLORIDE 0.9 % IV SOLN
250.0000 mL | INTRAVENOUS | Status: AC | PRN
Start: 2024-04-23 — End: 2024-04-24

## 2024-04-23 MED ORDER — HEPARIN (PORCINE) IN NACL 1000-0.9 UT/500ML-% IV SOLN
INTRAVENOUS | Status: DC | PRN
  Administered 2024-04-23: 500 mL

## 2024-04-23 MED ORDER — SODIUM CHLORIDE 0.9% FLUSH: 3.0000 mL | INTRAVENOUS | Status: DC | PRN

## 2024-04-23 MED ORDER — CEFAZOLIN SODIUM-DEXTROSE 2-4 GM/100ML-% IV SOLN
INTRAVENOUS | Status: AC
  Filled 2024-04-23: qty 100

## 2024-04-23 MED ORDER — MIDAZOLAM HCL 2 MG/2ML IJ SOLN
INTRAMUSCULAR | Status: AC
  Filled 2024-04-23: qty 2

## 2024-04-23 MED ORDER — CEFAZOLIN SODIUM-DEXTROSE 2-4 GM/100ML-% IV SOLN: 2.0000 g | INTRAVENOUS | Status: DC

## 2024-04-23 MED ORDER — SODIUM CHLORIDE 0.9% FLUSH
3.0000 mL | Freq: Two times a day (BID) | INTRAVENOUS | Status: DC
  Administered 2024-04-23 – 2024-04-24 (×2): 3 mL via INTRAVENOUS

## 2024-04-23 MED ORDER — ACETAMINOPHEN 325 MG PO TABS: 650.0000 mg | ORAL_TABLET | ORAL | Status: DC | PRN

## 2024-04-23 SURGICAL SUPPLY — 7 items
CATH BLAZERPRIME XP (ABLATOR) IMPLANT
CLOSURE PERCLOSE PROSTYLE (VASCULAR PRODUCTS) IMPLANT
MAT PREVALON FULL STRYKER (MISCELLANEOUS) IMPLANT
PACK EP LF (CUSTOM PROCEDURE TRAY) ×1 IMPLANT
PAD DEFIB RADIO PHYSIO CONN (PAD) ×1 IMPLANT
SHEATH PINNACLE 8F 10CM (SHEATH) IMPLANT
SHEATH PROBE COVER 6X72 (BAG) IMPLANT
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
## 2024-04-23 NOTE — Progress Notes (Signed)
 Inpatient Rehab Admissions Coordinator Note:  Per therapy recommendations patient was screened for CIR candidacy by Reche FORBES Lowers, PT. Note pt admitted for lumbar fx with recommendations for non-operative management, also in AF pending potential AVN ablation.  She has [facility] Medicare which will not approve CIR admission for her diagnoses.  Recommend other rehab venues be pursued.    Reche Lowers, PT, DPT [phone] 1:41 PM

## 2024-04-23 NOTE — Interval H&P Note (Signed)
 History and Physical Interval Note:  04/23/2024 4:30 PM  [name]  has presented today for surgery, with the diagnosis of av node.  The various methods of treatment have been discussed with the patient and family. After consideration of risks, benefits and other options for treatment, the patient has consented to  Procedure(s): AV NODE ABLATION (N/A) as a surgical intervention.  The patient's history has been reviewed, patient examined, no change in status, stable for surgery.  I have reviewed the patient's chart and labs.  Questions were answered to the patient's satisfaction.     Danelle Birmingham

## 2024-04-23 NOTE — Progress Notes (Addendum)
  Patient Name: [name] Date of Encounter: 04/23/2024  Primary Cardiologist: Shelda Bruckner, MD Electrophysiologist: CAMERON T LAMBERT, MD  Interval Summary   The patient is doing well today.  Post HD early am of 9/3 with 1L UF removed.   At this time, the patient denies chest pain, shortness of breath, or any new concerns.  Vital Signs    Vitals:   04/23/24 0230 04/23/24 0300 04/23/24 0312 04/23/24 0500  BP: (!) 121/103 (!) 108/94 121/89 101/61  Pulse: (!) 133  (!) 113   Resp: 16 15 15    Temp:   97.9 F (36.6 C)   TempSrc:      SpO2: 100% 100% 99%   Weight:   116.9 kg   Height:        Intake/Output Summary (Last 24 hours) at 04/23/2024 0722 Last data filed at 04/23/2024 0350 Gross per 24 hour  Intake 953.87 ml  Output 1500 ml  Net -546.13 ml   Filed Weights   04/22/24 0137 04/22/24 2315 04/23/24 0312  Weight: 117.9 kg 117.9 kg 116.9 kg    Physical Exam    GEN- NAD, Alert and oriented  Lungs- Clear to ausculation bilaterally, normal work of breathing Cardiac- Irregularly irregular rate and rhythm / regular, no murmurs, rubs or gallops GI- soft, NT, ND, + BS Extremities- no clubbing or cyanosis. No edema  Telemetry    AF 90-100's mixed vacillating with AFL 120-130's (personally reviewed)    DEVICE HISTORY:  MDT Micra AV Leadless implanted 03/27/24  Hospital Course    [name] is a 68 y.o. 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 Kerrin), HFpEF, prior DVT, ESRD on IHD, COPD and obesity admitted for intractable back pain, shortness of breath and palpitations. She was noted to have atypical AFL mixed with intermittent AF.  Assessment & Plan    Persistent Atrial Fibrillation / Atrial Flutter  Tachy-Brady Syndrome s/p MDT Leadless PPM -continue amiodarone  30mg /hr for now  -Eliquis  for stroke prophylaxis  -tele monitoring  -NPO  for possible AV node ablation today, timing to be determined  ESRD on HD  HD scheduled T/Th/S -HD on 9/3 ~ 0400 with 1L of UF removed  -volume status per Nephrology   Chronic Hypotension  -continue midodrine      For questions or updates, please contact [facility] Please consult [email or web address] for contact info under     Signed, Daphne Barrack, NP-C, AGACNP-BC [facility] - Electrophysiology  04/23/2024, 9:27 AM  I have seen, examined the patient, and reviewed the above assessment and plan.    Interval:  Remains in AF/AFL. Chronic back pain.   General: Well developed, in no acute distress.  Neck: No JVD.  Cardiac: Tachycardic, irregular rhythm.  Resp: Normal work of breathing.  Ext: No edema.  Neuro: No gross focal deficits.  Psych: Normal affect.   Assessment and Plan:  #. Persistent atrial fibrillation/flutter: Highly symptomatic. Limiting dialysis. Recurrent with fast rates despite AAD therapy - previously failed Tikosyn  and now on amiodarone . Numerous cardioversions. Underwent leadless PPM implant on 8/7 in anticipation of AV node ablation.  - Device check.  - Will plan for AV node ablation prior to discharge, possibly today if able to accommodate. Risks and benefits have been discussed.    #. ESRD on HD - Volume status managed with dialysis.   [name], MD 04/23/2024 3:21 PM

## 2024-04-23 NOTE — Progress Notes (Signed)
 PROGRESS NOTE    [name]  FMW:[id number] DOB: 06/30/1956 DOA: 04/22/2024 PCP: Leslie Rosaline SQUIBB, NP   Brief Narrative:  68 year old female with history of atrial fibrillation/flutter requiring Maze procedure and prior DCCV's currently on amiodarone  and Eliquis , recent history of leadless PPM placement on 03/27/2024 being considered for AVN ablation, CAD, chronic diastolic CHF, QT prolongation, status post mitral valve and tricuspid valve repair, ESRD on HD, COPD, OSA on CPAP, obesity, history of DVT, hypertension, hyperlipidemia, diabetes, GERD, anxiety, depression, recent admission with back pain after fall, and Afib/flutter RVR and cardioverted and PPM placed, was found to have a minor superior endplate /compression fracture of L5 in addition to L4 which was previously seen.  Patient presented with intractable back pain with radiation to right thigh and right foot after another fall on 04/19/2024.  On presentation, she was found to be in a flutter with RVR, initially treated with Cardizem  drip but switched to amiodarone  drip because of hypotension.  Cardiology was consulted.  Nephrology consulted to continue hemodialysis  Assessment & Plan:   Intractable back pain History of recent superior endplate/minor compression fracture of L4 and L5 Fall -Prior hospitalist discussed with EP: Will need to wait 6 weeks after PPM placement on 03/27/2024 to have MRI - Continue TLSO brace.  Continue pain management.  Still requiring intermittent IV Dilaudid .  Fall precautions. - PT/OT eval.  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  Currently on amiodarone  drip.  EP following and planning for possible AV node ablation today.  Continue Eliquis   ESRD on HD - Nephrology following.  Dialysis as per nephrology schedule  Chronic hypotension - Continue midodrine   CAD Chronic diastolic  heart failure History of MV/TV repair Hyperlipidemia - Stable.  No chest pain.  Continue statin - Strict input output.  Daily weights.  Fluid restriction.  Volume management as per nephrology by dialysis  GERD - Continue Protonix   Diabetes mellitus type 2 - Continue CBGs with SSI  Anemia of chronic disease Macrocytosis - From chronic illnesses.  Hemoglobin stable.  Monitor intermittently.  History of DVT -Continue Eliquis   OSA - Continue CPAP nightly  COPD - Stable.  Nebs as needed  Anxiety/depression - Continue BuSpar  and duloxetine   Morbid obesity/obesity class III - Outpatient follow-up  DVT prophylaxis: Eliquis  Code Status: Full Family Communication: None at bedside Disposition Plan: Status is: Inpatient Remains inpatient appropriate because: Severity of illness    Consultants: EP/nephrology  Procedures: None  Antimicrobials: None   Subjective: Patient seen and examined at bedside.  Continues to have intermittent severe lower back pain.  No fever, vomiting, worsening abdominal pain reported.  Denies any current chest pain.  Objective: Vitals:   04/23/24 0230 04/23/24 0300 04/23/24 0312 04/23/24 0500  BP: (!) 121/103 (!) 108/94 121/89 101/61  Pulse: (!) 133  (!) 113   Resp: 16 15 15    Temp:   97.9 F (36.6 C)   TempSrc:      SpO2: 100% 100% 99%   Weight:   116.9 kg   Height:        Intake/Output Summary (Last 24 hours) at 04/23/2024 0754 Last data filed at 04/23/2024 0350 Gross per 24 hour  Intake 953.87 ml  Output 1500 ml  Net -546.13 ml   Filed Weights   04/22/24 0137 04/22/24 2315 04/23/24 0312  Weight: 117.9 kg 117.9 kg 116.9 kg    Examination:  General exam: Appears calm and comfortable.  Looks  chronically ill and deconditioned. Respiratory system: Bilateral decreased breath sounds at bases with scattered crackles Cardiovascular system: S1 & S2 heard, tachycardic gastrointestinal system: Abdomen is morbidly obese, nondistended, soft  and nontender. Normal bowel sounds heard. Extremities: No cyanosis, clubbing, edema  Central nervous system: Alert and oriented.  Slow to respond.  No focal neurological deficits. Moving extremities Skin: No rashes, lesions or ulcers Psychiatry: Flat affect.  Not agitated.    Data Reviewed: I have personally reviewed following labs and imaging studies  CBC: Recent Labs  Lab 04/19/24 1535 04/22/24 0158 04/23/24 0640  WBC 5.5 7.0 6.7  NEUTROABS 3.3  --   --   HGB 10.7* 10.6* 10.9*  HCT 34.0* 35.0* 35.3*  MCV 100.3* 101.4* 100.6*  PLT 159 175 178   Basic Metabolic Panel: Recent Labs  Lab 04/19/24 1535 04/22/24 0158 04/23/24 0640  NA 139 136 138  K 3.4* 4.8 4.5  CL 94* 93* 92*  CO2 29 25 27   GLUCOSE 136* 139* 117*  BUN 13 41* 26*  CREATININE 4.84* 9.97* 6.74*  CALCIUM  10.0 9.8 9.9   GFR: Estimated Creatinine Clearance: 10.7 mL/min (A) (by C-G formula based on SCr of 6.74 mg/dL (H)). Liver Function Tests: Recent Labs  Lab 04/22/24 0158  AST 19  ALT 10  ALKPHOS 92  BILITOT 1.0  PROT 7.8  ALBUMIN  3.5   Recent Labs  Lab 04/22/24 0158  LIPASE 28   No results for input(s): AMMONIA in the last 168 hours. Coagulation Profile: No results for input(s): INR, PROTIME in the last 168 hours. Cardiac Enzymes: No results for input(s): CKTOTAL, CKMB, CKMBINDEX, TROPONINI in the last 168 hours. BNP (last 3 results) Recent Labs    12/24/23 1004  PROBNP 8,609.0*   HbA1C: No results for input(s): HGBA1C in the last 72 hours. CBG: Recent Labs  Lab 04/22/24 0808 04/22/24 1207 04/22/24 1659 04/22/24 2130 04/23/24 0548  GLUCAP 145* 171* 131* 166* 115*   Lipid Profile: No results for input(s): CHOL, HDL, LDLCALC, TRIG, CHOLHDL, LDLDIRECT in the last 72 hours. Thyroid  Function Tests: No results for input(s): TSH, T4TOTAL, FREET4, T3FREE, THYROIDAB in the last 72 hours. Anemia Panel: No results for input(s): VITAMINB12,  FOLATE, FERRITIN, TIBC, IRON , RETICCTPCT in the last 72 hours. Sepsis Labs: No results for input(s): PROCALCITON, LATICACIDVEN in the last 168 hours.  No results found for this or any previous visit (from the past 240 hours).       Radiology Studies: CT ABDOMEN PELVIS WO CONTRAST Result Date: 04/22/2024 EXAM: CT ABDOMEN AND PELVIS WITHOUT CONTRAST 04/22/2024 04:16:18 AM TECHNIQUE: CT of the abdomen and pelvis was performed without the administration of intravenous contrast. Multiplanar reformatted images are provided for review. Automated exposure control, iterative reconstruction, and/or weight-based adjustment of the mA/kV was utilized to reduce the radiation dose to as low as reasonably achievable. COMPARISON: CT of the abdomen and pelvis dated 04/19/2024. CLINICAL HISTORY: Abdominal pain, acute, nonlocalized. Back pain s/p fall 5-6 days and was dx with right hip contusion. FINDINGS: LOWER CHEST: There are ground-glass and reticular opacities present dependently within the lower lobes bilaterally. There is mosaic perfusion of the lung bases. LIVER: The liver has a lobular micronodular contour. GALLBLADDER AND BILE DUCTS: The patient is status post cholecystectomy. SPLEEN: Normal size. No focal lesion. PANCREAS: No mass. No ductal dilatation. ADRENAL GLANDS: Normal appearance. No mass. KIDNEYS, URETERS AND BLADDER: No stones in the kidneys or ureters. No hydronephrosis. No perinephric or periureteral stranding. Urinary bladder is unremarkable. GI AND BOWEL:  Stomach demonstrates no acute abnormality. There is no bowel obstruction. No bowel wall thickening. PERITONEUM AND RETROPERITONEUM: No ascites. No free air. VASCULATURE: There are peripherally calcified left renal artery aneurysms present, which measure approximately 16 and 14 mm in diameter respectively, similar to the prior exam. LYMPH NODES: No lymphadenopathy. REPRODUCTIVE ORGANS: The patient is status post hysterectomy and  bilateral salpingo-oophorectomy. BONES AND SOFT TISSUES: No acute osseous abnormality. No focal soft tissue abnormality. IMPRESSION: 1. Hepatic cirrhosis 2. Peripherally calcified left renal artery aneurysms, measuring approximately 16 and 14 mm in diameter, similar to the prior exam. Electronically signed by: Evalene Coho MD 04/22/2024 04:31 AM EDT RP Workstation: [id number]   DG Pelvis Portable Result Date: 04/22/2024 CLINICAL DATA:  Fall, back pain EXAM: PORTABLE PELVIS 1-2 VIEWS COMPARISON:  None Available. FINDINGS: There is no evidence of pelvic fracture or diastasis. No pelvic bone lesions are seen. IMPRESSION: Negative. Electronically Signed   By: Dorethia Molt M.D.   On: 04/22/2024 02:27   DG Chest Portable 1 View Result Date: 04/22/2024 CLINICAL DATA:  Fall, back pain EXAM: PORTABLE CHEST 1 VIEW COMPARISON:  03/28/2024 FINDINGS: Lung volumes are small. Mild elevation of the right hemidiaphragm. No focal pulmonary infiltrate. No pneumothorax or pleural effusion. Mitral and tricuspid valve replacement and left atrial clipping has been performed. Lead less pacemaker noted. Cardiac size is enlarged, unchanged. Pulmonary vascularity is normal. IMPRESSION: 1. Pulmonary hypoinflation. 2. Stable cardiomegaly. Electronically Signed   By: Dorethia Molt M.D.   On: 04/22/2024 02:26        Scheduled Meds:  apixaban   5 mg Oral BID   busPIRone   5 mg Oral BID   DULoxetine   60 mg Oral Daily   gabapentin   100 mg Oral Q T,Th,Sat-1800   insulin  aspart  0-9 Units Subcutaneous TID WC   midodrine   10 mg Oral BID   pantoprazole   40 mg Oral Daily   polyethylene glycol  17 g Oral Daily   rosuvastatin   10 mg Oral QHS   senna  1 tablet Oral QHS   sodium chloride  flush  3 mL Intravenous Q12H   Continuous Infusions:  amiodarone  30 mg/hr (04/23/24 0505)          Sophie Mao, MD [facility] 04/23/2024, 7:54 AM

## 2024-04-23 NOTE — Plan of Care (Signed)
  Problem: Coping: Goal: Ability to adjust to condition or change in health will improve Outcome: Progressing   Problem: Nutritional: Goal: Maintenance of adequate nutrition will improve Outcome: Progressing   Problem: Skin Integrity: Goal: Risk for impaired skin integrity will decrease Outcome: Progressing   Problem: Education: Goal: Knowledge of General Education information will improve Description: Including pain rating scale, medication(s)/side effects and non-pharmacologic comfort measures Outcome: Progressing   Problem: Pain Managment: Goal: General experience of comfort will improve and/or be controlled Outcome: Progressing

## 2024-04-23 NOTE — H&P (View-Only) (Signed)
  Patient Name: [name] Date of Encounter: 04/23/2024  Primary Cardiologist: Shelda Bruckner, MD Electrophysiologist: CAMERON T LAMBERT, MD  Interval Summary   The patient is doing well today.  Post HD early am of 9/3 with 1L UF removed.   At this time, the patient denies chest pain, shortness of breath, or any new concerns.  Vital Signs    Vitals:   04/23/24 0230 04/23/24 0300 04/23/24 0312 04/23/24 0500  BP: (!) 121/103 (!) 108/94 121/89 101/61  Pulse: (!) 133  (!) 113   Resp: 16 15 15    Temp:   97.9 F (36.6 C)   TempSrc:      SpO2: 100% 100% 99%   Weight:   116.9 kg   Height:        Intake/Output Summary (Last 24 hours) at 04/23/2024 0722 Last data filed at 04/23/2024 0350 Gross per 24 hour  Intake 953.87 ml  Output 1500 ml  Net -546.13 ml   Filed Weights   04/22/24 0137 04/22/24 2315 04/23/24 0312  Weight: 117.9 kg 117.9 kg 116.9 kg    Physical Exam    GEN- NAD, Alert and oriented  Lungs- Clear to ausculation bilaterally, normal work of breathing Cardiac- Irregularly irregular rate and rhythm / regular, no murmurs, rubs or gallops GI- soft, NT, ND, + BS Extremities- no clubbing or cyanosis. No edema  Telemetry    AF 90-100's mixed vacillating with AFL 120-130's (personally reviewed)    DEVICE HISTORY:  MDT Micra AV Leadless implanted 03/27/24  Hospital Course    [name] is a 68 y.o. 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 Kerrin), HFpEF, prior DVT, ESRD on IHD, COPD and obesity admitted for intractable back pain, shortness of breath and palpitations. She was noted to have atypical AFL mixed with intermittent AF.  Assessment & Plan    Persistent Atrial Fibrillation / Atrial Flutter  Tachy-Brady Syndrome s/p MDT Leadless PPM -continue amiodarone  30mg /hr for now  -Eliquis  for stroke prophylaxis  -tele monitoring  -NPO  for possible AV node ablation today, timing to be determined  ESRD on HD  HD scheduled T/Th/S -HD on 9/3 ~ 0400 with 1L of UF removed  -volume status per Nephrology   Chronic Hypotension  -continue midodrine      For questions or updates, please contact [facility] Please consult [email or web address] for contact info under     Signed, Daphne Barrack, NP-C, AGACNP-BC [facility] - Electrophysiology  04/23/2024, 9:27 AM  I have seen, examined the patient, and reviewed the above assessment and plan.    Interval:  Remains in AF/AFL. Chronic back pain.   General: Well developed, in no acute distress.  Neck: No JVD.  Cardiac: Tachycardic, irregular rhythm.  Resp: Normal work of breathing.  Ext: No edema.  Neuro: No gross focal deficits.  Psych: Normal affect.   Assessment and Plan:  #. Persistent atrial fibrillation/flutter: Highly symptomatic. Limiting dialysis. Recurrent with fast rates despite AAD therapy - previously failed Tikosyn  and now on amiodarone . Numerous cardioversions. Underwent leadless PPM implant on 8/7 in anticipation of AV node ablation.  - Device check.  - Will plan for AV node ablation prior to discharge, possibly today if able to accommodate. Risks and benefits have been discussed.    #. ESRD on HD - Volume status managed with dialysis.   [name], MD 04/23/2024 3:21 PM

## 2024-04-23 NOTE — Progress Notes (Signed)
 [facility] Progress Note   Subjective:   Seen in room, s/p HD overnight with 1L off. For AV node ablation today per notes. Still having lower abd/R hip/back pain - imaging was negative. No CP or dyspnea today.  Objective Vitals:   04/23/24 0300 04/23/24 0312 04/23/24 0500 04/23/24 0831  BP: (!) 108/94 121/89 101/61 (!) 97/34  Pulse:  (!) 113  (!) 130  Resp: 15 15  13   Temp:  97.9 F (36.6 C)  97.6 F (36.4 C)  TempSrc:    Oral  SpO2: 100% 99%  93%  Weight:  116.9 kg    Height:       Physical Exam General: Well appearing woman, NAD. [place] O2 in place Heart:Tachycardic, no murmur Lungs: CTA anteriorly Abdomen: soft Extremities: no LE edema Dialysis Access: AVF +t/b  Additional Objective Labs: Basic Metabolic Panel: Recent Labs  Lab 04/19/24 1535 04/22/24 0158 04/23/24 0640  NA 139 136 138  K 3.4* 4.8 4.5  CL 94* 93* 92*  CO2 29 25 27   GLUCOSE 136* 139* 117*  BUN 13 41* 26*  CREATININE 4.84* 9.97* 6.74*  CALCIUM  10.0 9.8 9.9   Liver Function Tests: Recent Labs  Lab 04/22/24 0158  AST 19  ALT 10  ALKPHOS 92  BILITOT 1.0  PROT 7.8  ALBUMIN  3.5   Recent Labs  Lab 04/22/24 0158  LIPASE 28   CBC: Recent Labs  Lab 04/19/24 1535 04/22/24 0158 04/23/24 0640  WBC 5.5 7.0 6.7  NEUTROABS 3.3  --   --   HGB 10.7* 10.6* 10.9*  HCT 34.0* 35.0* 35.3*  MCV 100.3* 101.4* 100.6*  PLT 159 175 178   Studies/Results: CT ABDOMEN PELVIS WO CONTRAST Result Date: 04/22/2024 EXAM: CT ABDOMEN AND PELVIS WITHOUT CONTRAST 04/22/2024 04:16:18 AM TECHNIQUE: CT of the abdomen and pelvis was performed without the administration of intravenous contrast. Multiplanar reformatted images are provided for review. Automated exposure control, iterative reconstruction, and/or weight-based adjustment of the mA/kV was utilized to reduce the radiation dose to as low as reasonably achievable. COMPARISON: CT of the abdomen and pelvis dated 04/19/2024. CLINICAL HISTORY:  Abdominal pain, acute, nonlocalized. Back pain s/p fall 5-6 days and was dx with right hip contusion. FINDINGS: LOWER CHEST: There are ground-glass and reticular opacities present dependently within the lower lobes bilaterally. There is mosaic perfusion of the lung bases. LIVER: The liver has a lobular micronodular contour. GALLBLADDER AND BILE DUCTS: The patient is status post cholecystectomy. SPLEEN: Normal size. No focal lesion. PANCREAS: No mass. No ductal dilatation. ADRENAL GLANDS: Normal appearance. No mass. KIDNEYS, URETERS AND BLADDER: No stones in the kidneys or ureters. No hydronephrosis. No perinephric or periureteral stranding. Urinary bladder is unremarkable. GI AND BOWEL: Stomach demonstrates no acute abnormality. There is no bowel obstruction. No bowel wall thickening. PERITONEUM AND RETROPERITONEUM: No ascites. No free air. VASCULATURE: There are peripherally calcified left renal artery aneurysms present, which measure approximately 16 and 14 mm in diameter respectively, similar to the prior exam. LYMPH NODES: No lymphadenopathy. REPRODUCTIVE ORGANS: The patient is status post hysterectomy and bilateral salpingo-oophorectomy. BONES AND SOFT TISSUES: No acute osseous abnormality. No focal soft tissue abnormality. IMPRESSION: 1. Hepatic cirrhosis 2. Peripherally calcified left renal artery aneurysms, measuring approximately 16 and 14 mm in diameter, similar to the prior exam. Electronically signed by: Evalene Coho MD 04/22/2024 04:31 AM EDT RP Workstation: [id number]   DG Pelvis Portable Result Date: 04/22/2024 CLINICAL DATA:  Fall, back pain EXAM: PORTABLE PELVIS 1-2 VIEWS COMPARISON:  None Available. FINDINGS: There is no evidence of pelvic fracture or diastasis. No pelvic bone lesions are seen. IMPRESSION: Negative. Electronically Signed   By: Dorethia Molt M.D.   On: 04/22/2024 02:27   DG Chest Portable 1 View Result Date: 04/22/2024 CLINICAL DATA:  Fall, back pain EXAM: PORTABLE CHEST 1  VIEW COMPARISON:  03/28/2024 FINDINGS: Lung volumes are small. Mild elevation of the right hemidiaphragm. No focal pulmonary infiltrate. No pneumothorax or pleural effusion. Mitral and tricuspid valve replacement and left atrial clipping has been performed. Lead less pacemaker noted. Cardiac size is enlarged, unchanged. Pulmonary vascularity is normal. IMPRESSION: 1. Pulmonary hypoinflation. 2. Stable cardiomegaly. Electronically Signed   By: Dorethia Molt M.D.   On: 04/22/2024 02:26   Medications:  amiodarone  30 mg/hr (04/23/24 0505)    apixaban   5 mg Oral BID   busPIRone   5 mg Oral BID   DULoxetine   60 mg Oral Daily   gabapentin   100 mg Oral Q T,Th,Sat-1800   insulin  aspart  0-9 Units Subcutaneous TID WC   midodrine   10 mg Oral BID   pantoprazole   40 mg Oral Daily   polyethylene glycol  17 g Oral Daily   rosuvastatin   10 mg Oral QHS   senna  1 tablet Oral QHS   sodium chloride  flush  3 mL Intravenous Q12H   Dialysis Orders TTS - AF 4hr, 400/600, EDW 123.8kg, 2K/2Ca bath, AVF, heparin  2500 unit bolus - Mircera 50mcg IV q 2 weeks (last 8/26, due 9/19) - Hectoral 2mcg IV q HD - Under EDW recently  Assessment/Plan: Atrial flutter with RVR: EP following, on amiodarone  drip. On Eliquis  and has pacemaker. For AV node ablation today per notes. ESRD: Continue HD on TTS schedule - next tomorrow. HypoTN/volume: Chronically low BP, on mido. UF as tolerated. 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 Continue home meds. Nutrition:    Katie Prestyn Stanco, PA-C 04/23/2024, 10:25 AM  [facility]

## 2024-04-23 NOTE — Progress Notes (Signed)
 Received patient in bed to unit.  Alert and oriented.  Informed consent signed and in chart.   TX duration: 3.5 hours  Patient tolerated well.  Transported back to the room  Alert, without acute distress.  Hand-off given to patient's nurse. Shasta Null, RN   Access used: Left arm AVF Access issues: None  Total UF removed: Medication(s) given: Heparin  2000U/bolus Post HD VS: T 97.9 (oral) Post HD weight: 116.9kg  Neville Seip, RN [facility]    04/23/24 0312  Vitals  Temp 97.9 F (36.6 C)  BP 121/89  MAP (mmHg) 100  BP Location Right Arm  BP Method Automatic  Patient Position (if appropriate) Lying  Pulse Rate (!) 113  Pulse Rate Source Monitor  ECG Heart Rate (!) 131  Resp 15  Weight 116.9 kg  Oxygen  Therapy  SpO2 99 %  O2 Device Nasal Cannula  O2 Flow Rate (L/min) 2 L/min  Patient Activity (if Appropriate) In bed  Pulse Oximetry Type Continuous  During Treatment Monitoring  Blood Flow Rate (mL/min) 0 mL/min  Arterial Pressure (mmHg) 34.34 mmHg  Venous Pressure (mmHg) -34.74 mmHg  TMP (mmHg) 21.61 mmHg  Ultrafiltration Rate (mL/min) 471 mL/min  Dialysate Flow Rate (mL/min) 300 ml/min  Dialysate Potassium Concentration 2  Dialysate Calcium  Concentration 2.5  Duration of HD Treatment -hour(s) 3.5 hour(s)  Cumulative Fluid Removed (mL) per Treatment  1000.1  HD Safety Checks Performed Yes  Intra-Hemodialysis Comments Tolerated well;Tx completed  Post Treatment  Dialyzer Clearance Lightly streaked  Liters Processed 73.5  Fluid Removed (mL) 1000 mL  Tolerated HD Treatment Yes  Post-Hemodialysis Comments Tolerated treatment well, stable post treatment  Fistula / Graft Left Upper arm  No placement date or time found.   Placed prior to admission: Yes  Orientation: Left  Access Location: Upper arm  Site Condition No complications  Fistula / Graft Assessment Present;Thrill;Bruit  Status Deaccessed  Needle Size 15  Drainage Description None

## 2024-04-24 ENCOUNTER — Encounter (HOSPITAL_COMMUNITY): Payer: Self-pay | Admitting: Internal Medicine

## 2024-04-24 ENCOUNTER — Other Ambulatory Visit (INDEPENDENT_AMBULATORY_CARE_PROVIDER_SITE_OTHER): Payer: Self-pay | Admitting: Primary Care

## 2024-04-24 DIAGNOSIS — K3184 Gastroparesis: Secondary | ICD-10-CM

## 2024-04-24 DIAGNOSIS — M549 Dorsalgia, unspecified: Secondary | ICD-10-CM | POA: Diagnosis not present

## 2024-04-24 LAB — GLUCOSE, CAPILLARY
Glucose-Capillary: 125 mg/dL — ABNORMAL HIGH (ref 70–99)
Glucose-Capillary: 138 mg/dL — ABNORMAL HIGH (ref 70–99)
Glucose-Capillary: 181 mg/dL — ABNORMAL HIGH (ref 70–99)
Glucose-Capillary: 177 mg/dL — ABNORMAL HIGH (ref 70–99)

## 2024-04-24 LAB — HEPATITIS B SURFACE ANTIBODY, QUANTITATIVE: Hep B S AB Quant (Post): 62.1 m[IU]/mL

## 2024-04-24 MED ORDER — OXYCODONE HCL 5 MG PO TABS
5.0000 mg | ORAL_TABLET | ORAL | Status: DC | PRN
  Administered 2024-04-24 – 2024-04-25 (×2): 5 mg via ORAL
  Filled 2024-04-24 (×2): qty 1

## 2024-04-24 MED ORDER — SENNA 8.6 MG PO TABS
1.0000 | ORAL_TABLET | Freq: Two times a day (BID) | ORAL | Status: DC
  Administered 2024-04-24 (×2): 8.6 mg via ORAL
  Filled 2024-04-24 (×3): qty 1

## 2024-04-24 MED ORDER — PENTAFLUOROPROP-TETRAFLUOROETH EX AERO
INHALATION_SPRAY | CUTANEOUS | Status: AC
  Filled 2024-04-24: qty 30

## 2024-04-24 MED ORDER — HYDROMORPHONE HCL 1 MG/ML IJ SOLN
0.5000 mg | INTRAMUSCULAR | Status: DC | PRN
  Administered 2024-04-24: 0.5 mg via INTRAVENOUS
  Filled 2024-04-24: qty 0.5

## 2024-04-24 MED FILL — Midazolam HCl Inj 2 MG/2ML (Base Equivalent): INTRAMUSCULAR | Qty: 2 | Status: AC

## 2024-04-24 MED FILL — Bupivacaine HCl Preservative Free (PF) Inj 0.25%: INTRAMUSCULAR | Qty: 30 | Status: AC

## 2024-04-24 NOTE — Progress Notes (Signed)
 Pt came back to rm 17 from HD. Reinitiated tele. Vss. call bell within reach.   Amado GORMAN Arabia, RN

## 2024-04-24 NOTE — TOC Progression Note (Signed)
 Transition of Care [facility]) - Progression Note    Patient Details  Name: [name] MRN: [id number] Date of Birth: [birth year]  Transition of [facility] CM/SW Contact  Aadin Gaut [place], [place] Phone Number: 04/24/2024, 1:10 PM  Clinical Narrative: PT rec is for inpatient rehab. Per CIR, pt does not have a diagnosis that [facility] will approve for inpatient rehab. Discussed SNF as back up rehab option with pt and pt's dtr [name] [phone], they are adamantly refusing this option. Dtr requesting HH vs outpatient rehab, CM to assist.   Julien Das, MSW, LCSW [phone] (coverage)                        Expected Discharge Plan and Services                                               Social Drivers of Health (SDOH) Interventions SDOH Screenings   Food Insecurity: No Food Insecurity (04/22/2024)  Housing: High Risk (04/22/2024)  Transportation Needs: No Transportation Needs (04/22/2024)  Utilities: Not At Risk (04/22/2024)  Alcohol  Screen: Low Risk  (06/04/2023)  Depression (PHQ2-9): Low Risk  (03/31/2024)  Financial Resource Strain: Low Risk  (11/19/2023)  Physical Activity: Insufficiently Active (06/04/2023)  Social Connections: Moderately Isolated (04/22/2024)  Stress: No Stress Concern Present (06/04/2023)  Tobacco Use: Low Risk  (04/22/2024)  Health Literacy: Patient Declined (06/04/2023)    Readmission Risk Interventions    03/05/2024   12:22 PM 11/12/2023    4:32 PM 03/16/2023    3:57 PM  Readmission Risk Prevention Plan  Transportation Screening Complete Complete Complete  PCP or Specialist Appt within 3-5 Days Complete    HRI or Home Care Consult Complete Complete   Social Work Consult for Recovery Care Planning/Counseling Complete Complete   Palliative Care Screening Not Applicable Not Applicable   Medication Review Oceanographer) Referral to Pharmacy Referral to Pharmacy Referral to Pharmacy  PCP or Specialist appointment within 3-5  days of discharge   Complete  HRI or Home Care Consult   Complete  SW Recovery Care/Counseling Consult   Complete  Palliative Care Screening   Not Applicable  Skilled Nursing Facility   Not Applicable

## 2024-04-24 NOTE — Progress Notes (Signed)
   04/24/24 1751  Vitals  Temp 98.4 F (36.9 C)  Pulse Rate 82  Resp 19  BP 102/83  SpO2 97 %  Weight 131.2 kg  Type of Weight Post-Dialysis  Oxygen  Therapy  O2 Flow Rate (L/min) 2 L/min  Patient Activity (if Appropriate) In bed  Pulse Oximetry Type Continuous  Oximetry Probe Site Changed No  Post Treatment  Dialyzer Clearance Lightly streaked  Hemodialysis Intake (mL) 0 mL  Liters Processed 84  Fluid Removed (mL) 1600 mL  Tolerated HD Treatment Yes  AVG/AVF Arterial Site Held (minutes) 10 minutes  AVG/AVF Venous Site Held (minutes) 10 minutes   Received patient in bed to unit.  Alert and oriented.  Informed consent signed and in chart.   TX duration:3.5HRS  Patient tolerated well.  Transported back to the room  Alert, without acute distress.  Hand-off given to patient's nurse.   Access used: LAVF Access issues: NONE  Total UF removed: 1.6L Medication(s) given: NONE   [facility]

## 2024-04-24 NOTE — Telephone Encounter (Signed)
 Requested medications are due for refill today.  yes  Requested medications are on the active medications list.  yes  Last refill. 04/24/2023 #90 0 rf  Future visit scheduled.   yes  Notes to clinic.  Pt should have been out of this medication in 07/2023.    Requested Prescriptions  Pending Prescriptions Disp Refills   pantoprazole  (PROTONIX ) 40 MG tablet [Pharmacy Med Name: pantoprazole  40 mg tablet,delayed release] 90 tablet 11    Sig: TAKE ONE TABLET BY MOUTH DAILY AT 9AM     Gastroenterology: Proton Pump Inhibitors Passed - 04/24/2024  2:34 PM      Passed - Valid encounter within last 12 months    Recent Outpatient Visits           4 months ago Generalized anxiety disorder   [place] [facility] Celestia Rosaline SQUIBB, NP   5 months ago Cough productive of purulent sputum   [place] [facility] Celestia Rosaline SQUIBB, NP   1 year ago Generalized anxiety disorder   [place] [facility] Celestia Rosaline SQUIBB, NP   1 year ago Diabetes mellitus type 2 in obese [facility])   [place] [facility] Celestia Rosaline SQUIBB, NP   1 year ago Diabetes mellitus type 2 in obese [facility])   [facility] [place] Delbert Clam, MD       Future Appointments             In 2 weeks Lonni Slain, MD [facility], [place]

## 2024-04-24 NOTE — Progress Notes (Signed)
  [facility] Progress Note   Subjective:   Seen in room. S/p AV node ablation yesterday - HR great today, now off amiodarone . For HD today. Back still bothering her - SNF being considered per notes.  Objective Vitals:   04/23/24 2326 04/24/24 0219 04/24/24 0322 04/24/24 0848  BP: 113/71  121/68 (!) 98/58  Pulse: 89  88 80  Resp: 20 10 20 18   Temp: 97.7 F (36.5 C)  97.8 F (36.6 C) 97.7 F (36.5 C)  TempSrc: Oral  Oral Oral  SpO2: 100% 100% 100% 92%  Weight:  134.3 kg    Height:       Physical Exam General: Well appearing woman, NAD. [place] O2 in place Heart:RRR; no murmur Lungs: CTA anteriorly Abdomen: soft Extremities: no LE edema Dialysis Access: AVF +t/b  Additional Objective Labs: Basic Metabolic Panel: Recent Labs  Lab 04/19/24 1535 04/22/24 0158 04/23/24 0640  NA 139 136 138  K 3.4* 4.8 4.5  CL 94* 93* 92*  CO2 29 25 27   GLUCOSE 136* 139* 117*  BUN 13 41* 26*  CREATININE 4.84* 9.97* 6.74*  CALCIUM  10.0 9.8 9.9   Liver Function Tests: Recent Labs  Lab 04/22/24 0158  AST 19  ALT 10  ALKPHOS 92  BILITOT 1.0  PROT 7.8  ALBUMIN  3.5   Recent Labs  Lab 04/22/24 0158  LIPASE 28   CBC: Recent Labs  Lab 04/19/24 1535 04/22/24 0158 04/23/24 0640  WBC 5.5 7.0 6.7  NEUTROABS 3.3  --   --   HGB 10.7* 10.6* 10.9*  HCT 34.0* 35.0* 35.3*  MCV 100.3* 101.4* 100.6*  PLT 159 175 178   Studies/Results: EP STUDY Result Date: 04/23/2024 Conclusion: successful AV node ablation with the creation of CHB in a patient with uncontrolled atrial fib/flutter, s/p prior PPM insertion. Dr. Adina Primus assisted with the procedure as the co-surgeon. Gregg Taylor,MD   Medications:  sodium chloride       apixaban   5 mg Oral BID   busPIRone   5 mg Oral BID   DULoxetine   60 mg Oral Daily   gabapentin   100 mg Oral Q T,Th,Sat-1800   insulin  aspart  0-9 Units Subcutaneous TID WC   midodrine   10 mg Oral BID   pantoprazole   40 mg Oral Daily   polyethylene  glycol  17 g Oral Daily   rosuvastatin   10 mg Oral QHS   senna  1 tablet Oral BID   sodium chloride  flush  3 mL Intravenous Q12H   sodium chloride  flush  3 mL Intravenous Q12H   Dialysis Orders TTS - AF 4hr, 400/600, EDW 123.8kg, 2K/2Ca bath, AVF, heparin  2500 unit bolus - Mircera 50mcg IV q 2 weeks (last 8/26, due 9/19) - Hectoral 2mcg IV q HD - Under EDW recently   Assessment/Plan: Atrial flutter with RVR: EP team following, s/p AV node ablation yesterday. Now off amiodarone , has leadless pacemaker.  ESRD: Continue HD on TTS schedule - for HD today. HypoTN/volume: Chronically low BP, on mido. UF as tolerated. 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 Continue home meds. Back pain s/p fall: Per primary.    Izetta Boehringer, PA-C 04/24/2024, 10:53 AM  [employer]

## 2024-04-24 NOTE — Progress Notes (Addendum)
  Patient Name: [name] Date of Encounter: 04/24/2024  Primary Cardiologist: Shelda Bruckner, MD Electrophysiologist: CAMERON T LAMBERT, MD  Interval Summary   The patient is doing well today.  She is happy her procedure is complete.   At this time, the patient denies chest pain, shortness of breath, or any new concerns.  Vital Signs    Vitals:   04/23/24 2047 04/23/24 2326 04/24/24 0219 04/24/24 0322  BP:  113/71  121/68  Pulse: 75 89  88  Resp:  20 10 20   Temp:  97.7 F (36.5 C)  97.8 F (36.6 C)  TempSrc:  Oral  Oral  SpO2:  100% 100% 100%  Weight:   134.3 kg   Height:        Intake/Output Summary (Last 24 hours) at 04/24/2024 0711 Last data filed at 04/23/2024 2206 Gross per 24 hour  Intake 6 ml  Output --  Net 6 ml   Filed Weights   04/22/24 2315 04/23/24 0312 04/24/24 0219  Weight: 117.9 kg 116.9 kg 134.3 kg    Physical Exam    GEN- chronically ill appearing adult female in NAD, alert and oriented  Lungs- Clear to ausculation bilaterally, normal work of breathing Cardiac- Regular rate and rhythm, no murmurs, rubs or gallops GI- soft, NT, ND, + BS Extremities- no clubbing or cyanosis. No edema  Telemetry    VP 80's (personally reviewed)   DEVICE HISTORY:  MDT Micra AV Leadless implanted 03/27/24  Hospital Course    [name] is a 68 y.o. 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 Kerrin), HFpEF, prior DVT, ESRD on IHD, COPD and obesity admitted for intractable back pain, shortness of breath and palpitations. She was noted to have atypical AFL mixed with intermittent AF.   Assessment & Plan    Persistent Atrial Fibrillation / Atrial Flutter  Tachy-Brady Syndrome s/p MDT Leadless PPM, s/p AV Node Ablation  -discontinue amiodarone  from [facility]  -continue Eliquis   -EP follow up arranged with Dr. Kennyth on 9/15 as planned   ESRD  on HD  HD T/Th/S -per Nephrology   Chronic Hypotension  -midodrine  per [facility]    Ok to discharge from EP standpoint.  Will be availble PRN.  Please call with new concerns.     For questions or updates, please contact [facility] Please consult [email or web address] for contact info under     Signed, Daphne Barrack, NP-C, AGACNP-BC [facility] - Electrophysiology  04/24/2024, 7:11 AM  EP Attending  Patient seen and examined. Agree with the findings as noted above. The patient is doing well after AV node ablation. She is stable for DC home. She will follow up with Dr. Kennyth.   Danelle Dajaun Goldring,MD

## 2024-04-24 NOTE — Progress Notes (Signed)
 PROGRESS NOTE    [name]  FMW:[id number] DOB: [birth year] DOA: 04/22/2024 PCP: Celestia Rosaline SQUIBB, NP   Brief Narrative:  68 year old female with history of atrial fibrillation/flutter requiring Maze procedure and prior DCCV's currently on amiodarone  and Eliquis , recent history of leadless PPM placement on 03/27/2024 being considered for AVN ablation, CAD, chronic diastolic CHF, QT prolongation, status post mitral valve and tricuspid valve repair, ESRD on HD, COPD, OSA on CPAP, obesity, history of DVT, hypertension, hyperlipidemia, diabetes, GERD, anxiety, depression, recent admission with back pain after fall, and Afib/flutter RVR and cardioverted and PPM placed, was found to have a minor superior endplate /compression fracture of L5 in addition to L4 which was previously seen.  Patient presented with intractable back pain with radiation to right thigh and right foot after another fall on 04/19/2024.  On presentation, she was found to be in a flutter with RVR, initially treated with Cardizem  drip but switched to amiodarone  drip because of hypotension.  Cardiology was consulted.  Nephrology consulted to continue hemodialysis.  She underwent AV node ablation on 04/23/2024.  Assessment & Plan:   Intractable back pain History of recent superior endplate/minor compression fracture of L4 and L5 Fall -Prior hospitalist discussed with EP: Will need to wait 6 weeks after PPM placement on 03/27/2024 to have MRI - Continue TLSO brace.  Continue pain management.  Still requiring intermittent IV Dilaudid .  Will add as needed oxycodone  as well.  Fall precautions. - PT/OT recommending CIR.  Apparently, patient is not a candidate for CIR.  Consult TOC for SNF placement.    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   -underwent AV node ablation on 04/23/2024.follow further EP recommendations.  Continue  Eliquis   ESRD on HD - Nephrology following.  Dialysis as per nephrology schedule  Chronic hypotension - Continue midodrine   CAD Chronic diastolic heart failure History of MV/TV repair Hyperlipidemia - Stable.  No chest pain.  Continue statin - Strict input output.  Daily weights.  Fluid restriction.  Volume management as per nephrology by dialysis  GERD - Continue Protonix   Diabetes mellitus type 2 - Continue CBGs with SSI  Anemia of chronic disease Macrocytosis - From chronic illnesses.  Hemoglobin stable.  Monitor intermittently.  History of DVT -Continue Eliquis   OSA - Continue CPAP nightly  COPD - Stable.  Nebs as needed  Anxiety/depression - Continue BuSpar  and duloxetine   Morbid obesity/obesity class III - Outpatient follow-up  DVT prophylaxis: Eliquis  Code Status: Full Family Communication: None at bedside Disposition Plan: Status is: Inpatient Remains inpatient appropriate because: Severity of illness    Consultants: EP/nephrology  Procedures: None  Antimicrobials: None   Subjective: Patient seen and examined at bedside.  Continues to complain of intermittent severe lower back pain.  Denies any shortness of breath, chest pain, vomiting or fever.   Objective: Vitals:   04/23/24 2047 04/23/24 2326 04/24/24 0219 04/24/24 0322  BP:  113/71  121/68  Pulse: 75 89  88  Resp:  20 10 20   Temp:  97.7 F (36.5 C)  97.8 F (36.6 C)  TempSrc:  Oral  Oral  SpO2:  100% 100% 100%  Weight:   134.3 kg   Height:        Intake/Output Summary (Last 24 hours) at 04/24/2024 0838 Last data filed at 04/23/2024 2206 Gross per 24 hour  Intake 6 ml  Output --  Net 6 ml   [employer]   04/22/24  2315 04/23/24 0312 04/24/24 0219  Weight: 117.9 kg 116.9 kg 134.3 kg    Examination:  General: On room air.  No distress.  Chronically ill and deconditioned looking.  ENT/neck: No thyromegaly.  JVD is not elevated  respiratory: Decreased breath sounds at bases  bilaterally with some crackles; no wheezing  CVS: S1-S2 heard, rate controlled currently Abdominal: Soft, nontender, slightly distended; no organomegaly, bowel sounds are heard Extremities: Trace lower extremity edema; no cyanosis  CNS: Awake and alert.  Remains slow to respond.  No focal neurologic deficit.  Moves extremities Lymph: No obvious lymphadenopathy Skin: No obvious ecchymosis/lesions  psych: Mostly flat affect.  Currently not agitated.   Musculoskeletal: No obvious joint swelling/deformity     Data Reviewed: I have personally reviewed following labs and imaging studies  CBC: Recent Labs  Lab 04/19/24 1535 04/22/24 0158 04/23/24 0640  WBC 5.5 7.0 6.7  NEUTROABS 3.3  --   --   HGB 10.7* 10.6* 10.9*  HCT 34.0* 35.0* 35.3*  MCV 100.3* 101.4* 100.6*  PLT 159 175 178   Basic Metabolic Panel: Recent Labs  Lab 04/19/24 1535 04/22/24 0158 04/23/24 0640  NA 139 136 138  K 3.4* 4.8 4.5  CL 94* 93* 92*  CO2 29 25 27   GLUCOSE 136* 139* 117*  BUN 13 41* 26*  CREATININE 4.84* 9.97* 6.74*  CALCIUM  10.0 9.8 9.9   GFR: Estimated Creatinine Clearance: 11.6 mL/min (A) (by C-G formula based on SCr of 6.74 mg/dL (H)). Liver Function Tests: Recent Labs  Lab 04/22/24 0158  AST 19  ALT 10  ALKPHOS 92  BILITOT 1.0  PROT 7.8  ALBUMIN  3.5   Recent Labs  Lab 04/22/24 0158  LIPASE 28   No results for input(s): AMMONIA in the last 168 hours. Coagulation Profile: No results for input(s): INR, PROTIME in the last 168 hours. Cardiac Enzymes: No results for input(s): CKTOTAL, CKMB, CKMBINDEX, TROPONINI in the last 168 hours. BNP (last 3 results) Recent Labs    12/24/23 1004  PROBNP 8,609.0*   HbA1C: No results for input(s): HGBA1C in the last 72 hours. CBG: Recent Labs  Lab 04/23/24 0548 04/23/24 1104 04/23/24 1807 04/23/24 2114 04/24/24 0634  GLUCAP 115* 114* 139* 154* 125*   Lipid Profile: No results for input(s): CHOL, HDL,  LDLCALC, TRIG, CHOLHDL, LDLDIRECT in the last 72 hours. Thyroid  Function Tests: No results for input(s): TSH, T4TOTAL, FREET4, T3FREE, THYROIDAB in the last 72 hours. Anemia Panel: No results for input(s): VITAMINB12, FOLATE, FERRITIN, TIBC, IRON , RETICCTPCT in the last 72 hours. Sepsis Labs: No results for input(s): PROCALCITON, LATICACIDVEN in the last 168 hours.  No results found for this or any previous visit (from the past 240 hours).       Radiology Studies: EP STUDY Result Date: 04/23/2024 Conclusion: successful AV node ablation with the creation of CHB in a patient with uncontrolled atrial fib/flutter, s/p prior PPM insertion. Dr. Adina Primus assisted with the procedure as the co-surgeon. Gregg Taylor,MD        Scheduled Meds:  apixaban   5 mg Oral BID   busPIRone   5 mg Oral BID   DULoxetine   60 mg Oral Daily   gabapentin   100 mg Oral Q T,Th,Sat-1800   insulin  aspart  0-9 Units Subcutaneous TID WC   midodrine   10 mg Oral BID   pantoprazole   40 mg Oral Daily   polyethylene glycol  17 g Oral Daily   rosuvastatin   10 mg Oral QHS   senna  1  tablet Oral QHS   sodium chloride  flush  3 mL Intravenous Q12H   sodium chloride  flush  3 mL Intravenous Q12H   Continuous Infusions:  sodium chloride      amiodarone  30 mg/hr (04/23/24 0505)          Sophie Mao, MD [facility] 04/24/2024, 8:38 AM

## 2024-04-24 NOTE — Progress Notes (Signed)
 Pt refused miralax . Education given to pt. MD notified.   Amado GORMAN Arabia, RN

## 2024-04-25 ENCOUNTER — Other Ambulatory Visit (HOSPITAL_COMMUNITY): Payer: Self-pay

## 2024-04-25 DIAGNOSIS — M549 Dorsalgia, unspecified: Secondary | ICD-10-CM | POA: Diagnosis not present

## 2024-04-25 LAB — GLUCOSE, CAPILLARY: Glucose-Capillary: 112 mg/dL — ABNORMAL HIGH (ref 70–99)

## 2024-04-25 MED ORDER — MIDODRINE HCL 10 MG PO TABS
10.0000 mg | ORAL_TABLET | Freq: Two times a day (BID) | ORAL | 0 refills | Status: AC
  Filled 2024-04-25: qty 60, 30d supply, fill #0

## 2024-04-25 MED ORDER — SENNA 8.6 MG PO TABS
1.0000 | ORAL_TABLET | Freq: Two times a day (BID) | ORAL | 0 refills | Status: AC
  Filled 2024-04-25: qty 30, 15d supply, fill #0

## 2024-04-25 MED ORDER — POLYETHYLENE GLYCOL 3350 17 GM/SCOOP PO POWD
17.0000 g | Freq: Every day | ORAL | 0 refills | Status: AC | PRN
  Filled 2024-04-25: qty 238, 14d supply, fill #0

## 2024-04-25 MED ORDER — OXYCODONE-ACETAMINOPHEN 5-325 MG PO TABS
1.0000 | ORAL_TABLET | Freq: Three times a day (TID) | ORAL | 0 refills | Status: AC | PRN
  Filled 2024-04-25: qty 20, 7d supply, fill #0

## 2024-04-25 MED ORDER — GABAPENTIN 100 MG PO CAPS
100.0000 mg | ORAL_CAPSULE | ORAL | 0 refills | Status: AC
  Filled 2024-04-25: qty 12, 28d supply, fill #0

## 2024-04-25 NOTE — Discharge Planning (Signed)
 [facility] Patient Discharge Orders- [facility]: AF  Patient's name: [name] Admit/DC Dates: 04/22/2024 - 04/25/2024  Discharge Diagnoses: Persistent atrial fibrillation/flutter with RVR - s/p AV ablation 9/3 and PO Amiodarone  stopped. F/u with EP.   Intractable back pain - Per dc summary, recommends outpatient evaluation and follow-up with neurosurgery if symptoms do not improve  Chronic hypotension. On Midodrine   Aranesp : Given: No    Last Hgb: 10.9 PRBC's Given: No  ESA dose for discharge: continue mircera 50 mcg IV q 2 weeks   Heparin  change: No  EDW Change: No  [place] Change: No  Access intervention/Change: No  Hectorol  change: No  Discharge Labs: Calcium  9.9   Albumin  3.5  K+ 4.5  IV Antibiotics: No  On Coumadin ?: No, on Eliquis      D/C Meds to be reconciled by nurse after every discharge.  Completed By: Charmaine Piety, NP   Reviewed by: MD:______ RN_______

## 2024-04-25 NOTE — Plan of Care (Signed)
 Problem: Education: Goal: Ability to describe self-care measures that may prevent or decrease complications (Diabetes Survival Skills Education) will improve Outcome: Progressing Goal: Individualized Educational Video(s) Outcome: Progressing   Problem: Coping: Goal: Ability to adjust to condition or change in health will improve Outcome: Progressing   Problem: Fluid Volume: Goal: Ability to maintain a balanced intake and output will improve Outcome: Progressing   Problem: Health Behavior/Discharge Planning: Goal: Ability to identify and utilize available resources and services will improve Outcome: Progressing Goal: Ability to manage health-related needs will improve Outcome: Progressing   Problem: Metabolic: Goal: Ability to maintain appropriate glucose levels will improve Outcome: Progressing   Problem: Nutritional: Goal: Maintenance of adequate nutrition will improve Outcome: Progressing Goal: Progress toward achieving an optimal weight will improve Outcome: Progressing   Problem: Skin Integrity: Goal: Risk for impaired skin integrity will decrease Outcome: Progressing   Problem: Tissue Perfusion: Goal: Adequacy of tissue perfusion will improve Outcome: Progressing   Problem: Education: Goal: Knowledge of General Education information will improve Description: Including pain rating scale, medication(s)/side effects and non-pharmacologic comfort measures Outcome: Progressing   Problem: Health Behavior/Discharge Planning: Goal: Ability to manage health-related needs will improve Outcome: Progressing   Problem: Clinical Measurements: Goal: Ability to maintain clinical measurements within normal limits will improve Outcome: Progressing Goal: Will remain free from infection Outcome: Progressing Goal: Diagnostic test results will improve Outcome: Progressing Goal: Respiratory complications will improve Outcome: Progressing Goal: Cardiovascular complication will  be avoided Outcome: Progressing   Problem: Activity: Goal: Risk for activity intolerance will decrease Outcome: Progressing   Problem: Nutrition: Goal: Adequate nutrition will be maintained Outcome: Progressing   Problem: Coping: Goal: Level of anxiety will decrease Outcome: Progressing   Problem: Elimination: Goal: Will not experience complications related to bowel motility Outcome: Progressing Goal: Will not experience complications related to urinary retention Outcome: Progressing   Problem: Pain Managment: Goal: General experience of comfort will improve and/or be controlled Outcome: Progressing   Problem: Safety: Goal: Ability to remain free from injury will improve Outcome: Progressing   Problem: Skin Integrity: Goal: Risk for impaired skin integrity will decrease Outcome: Progressing   Problem: Education: Goal: Knowledge of cardiac device and self-care will improve Outcome: Progressing Goal: Ability to safely manage health related needs after discharge will improve Outcome: Progressing Goal: Individualized Educational Video(s) Outcome: Progressing   Problem: Cardiac: Goal: Ability to achieve and maintain adequate cardiopulmonary perfusion will improve Outcome: Progressing   Problem: Education: Goal: Understanding of disease, treatment, and recovery process will improve Outcome: Progressing   Problem: Activity: Goal: Ability to return to baseline activity level will improve Outcome: Progressing   Problem: Cardiac: Goal: Ability to maintain adequate cardiovascular perfusion will improve Outcome: Progressing Goal: Vascular access site(s) Level 0-1 will be maintained Outcome: Progressing   Problem: Health Behavior/ Discharge Planning: Goal: Ability to safely manage health related needs after discharge Outcome: Progressing   Problem: Education: Goal: Ability to describe self-care measures that may prevent or decrease complications (Diabetes Survival  Skills Education) will improve Outcome: Progressing Goal: Individualized Educational Video(s) Outcome: Progressing   Problem: Coping: Goal: Ability to adjust to condition or change in health will improve Outcome: Progressing   Problem: Fluid Volume: Goal: Ability to maintain a balanced intake and output will improve Outcome: Progressing   Problem: Health Behavior/Discharge Planning: Goal: Ability to identify and utilize available resources and services will improve Outcome: Progressing Goal: Ability to manage health-related needs will improve Outcome: Progressing   Problem: Metabolic: Goal: Ability to maintain appropriate glucose levels  will improve Outcome: Progressing   Problem: Nutritional: Goal: Maintenance of adequate nutrition will improve Outcome: Progressing Goal: Progress toward achieving an optimal weight will improve Outcome: Progressing   Problem: Skin Integrity: Goal: Risk for impaired skin integrity will decrease Outcome: Progressing   Problem: Tissue Perfusion: Goal: Adequacy of tissue perfusion will improve Outcome: Progressing   Problem: Education: Goal: Knowledge of General Education information will improve Description: Including pain rating scale, medication(s)/side effects and non-pharmacologic comfort measures Outcome: Progressing   Problem: Health Behavior/Discharge Planning: Goal: Ability to manage health-related needs will improve Outcome: Progressing   Problem: Clinical Measurements: Goal: Ability to maintain clinical measurements within normal limits will improve Outcome: Progressing Goal: Will remain free from infection Outcome: Progressing Goal: Diagnostic test results will improve Outcome: Progressing Goal: Respiratory complications will improve Outcome: Progressing Goal: Cardiovascular complication will be avoided Outcome: Progressing   Problem: Activity: Goal: Risk for activity intolerance will decrease Outcome: Progressing    Problem: Nutrition: Goal: Adequate nutrition will be maintained Outcome: Progressing   Problem: Coping: Goal: Level of anxiety will decrease Outcome: Progressing   Problem: Elimination: Goal: Will not experience complications related to bowel motility Outcome: Progressing Goal: Will not experience complications related to urinary retention Outcome: Progressing   Problem: Pain Managment: Goal: General experience of comfort will improve and/or be controlled Outcome: Progressing   Problem: Safety: Goal: Ability to remain free from injury will improve Outcome: Progressing   Problem: Skin Integrity: Goal: Risk for impaired skin integrity will decrease Outcome: Progressing   Problem: Education: Goal: Knowledge of cardiac device and self-care will improve Outcome: Progressing Goal: Ability to safely manage health related needs after discharge will improve Outcome: Progressing Goal: Individualized Educational Video(s) Outcome: Progressing   Problem: Cardiac: Goal: Ability to achieve and maintain adequate cardiopulmonary perfusion will improve Outcome: Progressing   Problem: Education: Goal: Understanding of disease, treatment, and recovery process will improve Outcome: Progressing   Problem: Activity: Goal: Ability to return to baseline activity level will improve Outcome: Progressing   Problem: Cardiac: Goal: Ability to maintain adequate cardiovascular perfusion will improve Outcome: Progressing Goal: Vascular access site(s) Level 0-1 will be maintained Outcome: Progressing   Problem: Health Behavior/ Discharge Planning: Goal: Ability to safely manage health related needs after discharge Outcome: Progressing

## 2024-04-25 NOTE — Progress Notes (Signed)
 D/C order noted. Contacted [facility] to be advised of pt's d/c today and that pt should resume care tomorrow.   Randine Mungo Dialysis Navigator [phone]

## 2024-04-25 NOTE — Progress Notes (Signed)
 [facility] Progress Note   Subjective:    Seen and examined patient at bedside. Tolerated yesterday's HD with net UF 1.6L. S/p AV node ablation 9/3. She denies palpitations or chest pain. Plan for HD tomorrow if she still remains inpatient.  Objective Vitals:   04/24/24 2119 04/25/24 0007 04/25/24 0416 04/25/24 0808  BP: 98/61 (!) 109/56 110/62 121/69  Pulse: 81 79 89 81  Resp: 16 16 13 16   Temp:  98.5 F (36.9 C) 98.2 F (36.8 C) 97.7 F (36.5 C)  TempSrc:  Oral Oral Oral  SpO2: 93% 96% 98% 100%  Weight:      Height:       Physical Exam General: Well appearing woman, NAD. On RA Heart:RRR; no murmur Lungs: CTA anteriorly Abdomen: soft Extremities: no LE edema Dialysis Access: AVF +t/b  Filed Weights   04/24/24 0219 04/24/24 1403 04/24/24 1751  Weight: 134.3 kg 132.7 kg 131.2 kg    Intake/Output Summary (Last 24 hours) at 04/25/2024 1103 Last data filed at 04/24/2024 2000 Gross per 24 hour  Intake 198.27 ml  Output 1600 ml  Net -1401.73 ml    Additional Objective Labs: Basic Metabolic Panel: Recent Labs  Lab 04/19/24 1535 04/22/24 0158 04/23/24 0640  NA 139 136 138  K 3.4* 4.8 4.5  CL 94* 93* 92*  CO2 29 25 27   GLUCOSE 136* 139* 117*  BUN 13 41* 26*  CREATININE 4.84* 9.97* 6.74*  CALCIUM  10.0 9.8 9.9   Liver Function Tests: Recent Labs  Lab 04/22/24 0158  AST 19  ALT 10  ALKPHOS 92  BILITOT 1.0  PROT 7.8  ALBUMIN  3.5   Recent Labs  Lab 04/22/24 0158  LIPASE 28   CBC: Recent Labs  Lab 04/19/24 1535 04/22/24 0158 04/23/24 0640  WBC 5.5 7.0 6.7  NEUTROABS 3.3  --   --   HGB 10.7* 10.6* 10.9*  HCT 34.0* 35.0* 35.3*  MCV 100.3* 101.4* 100.6*  PLT 159 175 178   Blood Culture    Component Value Date/Time   SDES  09/14/2022 0840    BLOOD RIGHT HAND Performed at [facility], [address], [place], [place] [address]    [facility]  09/14/2022 0840    BOTTLES DRAWN AEROBIC AND ANAEROBIC Blood  Culture adequate volume Performed at [facility], [address] [place], [place] [address]    CULT  09/14/2022 0840    NO GROWTH 5 DAYS Performed at [facility], [address] [place], [place] [address]    REPTSTATUS 09/19/2022 FINAL 09/14/2022 0840    Cardiac Enzymes: No results for input(s): CKTOTAL, CKMB, CKMBINDEX, TROPONINI in the last 168 hours. CBG: Recent Labs  Lab 04/24/24 0634 04/24/24 1130 04/24/24 1851 04/24/24 2116 04/25/24 0701  GLUCAP 125* 138* 181* 177* 112*   Iron  Studies: No results for input(s): IRON , TIBC, TRANSFERRIN, FERRITIN in the last 72 hours. 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 Dr. Adina Primus assisted with the procedure as the co-surgeon. Gregg Taylor,MD    Medications:   apixaban   5 mg Oral BID   busPIRone   5 mg Oral BID   DULoxetine   60 mg Oral Daily   gabapentin   100 mg Oral Q T,Th,Sat-1800  insulin  aspart  0-9 Units Subcutaneous TID WC   midodrine   10 mg Oral BID   pantoprazole   40 mg Oral Daily   polyethylene glycol  17 g Oral Daily   rosuvastatin   10 mg Oral QHS   senna  1 tablet Oral BID   sodium chloride  flush  3 mL Intravenous Q12H   sodium chloride  flush  3 mL Intravenous Q12H    Dialysis Orders: TTS - AF 4hr, 400/600, EDW 123.8kg, 2K/2Ca bath, AVF, heparin  2500 unit bolus - Mircera 50mcg IV q 2 weeks (last 8/26, due 9/19) - Hectoral 2mcg IV q HD - Under EDW recently  Assessment/Plan: Atrial flutter with RVR: EP team following, s/p AV node ablation 9/3. Now off amiodarone , has leadless pacemaker.  ESRD: Continue HD on TTS schedule - next HD 9/6 if she still remains inpatient HypoTN/volume: Chronically low BP,  on mido. UF as tolerated. 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 Continue home meds. Back pain s/p fall: Per primary. Dispo: Okay for discharge from a renal standpoint  Charmaine Piety, NP [facility] 04/25/2024,11:03 AM  LOS: 3 days

## 2024-04-25 NOTE — Discharge Summary (Signed)
 Physician Discharge Summary  [name] FMW:[id number] DOB: September 07, 1955 DOA: 04/22/2024  PCP: Celestia Rosaline SQUIBB, NP  Admit date: 04/22/2024 Discharge date: 04/25/2024  Admitted From: Home Disposition: Home.  Patient refused SNF placement  Recommendations for Outpatient Follow-up:  Follow up with PCP in 1 week with repeat CBC/BMP Ordered and follow-up with cardiology/EP Outpatient follow-up with hemodialysis unit as scheduled Recommend outpatient evaluation by neurosurgery if patient remains symptomatic Follow up in ED if symptoms worsen or new appear   Home Health: PT/OT Equipment/Devices: None  Discharge Condition: Stable CODE STATUS: Full Diet recommendation: Heart healthy/carb modified/renal hemodialysis diet  Brief/Interim Summary: 68 year old female with history of atrial fibrillation/flutter requiring Maze procedure and prior DCCV's currently on amiodarone  and Eliquis , recent history of leadless PPM placement on 03/27/2024 being considered for AVN ablation, CAD, chronic diastolic CHF, QT prolongation, status post mitral valve and tricuspid valve repair, ESRD on HD, COPD, OSA on CPAP, obesity, history of DVT, hypertension, hyperlipidemia, diabetes, GERD, anxiety, depression, recent admission with back pain after fall, and Afib/flutter RVR and cardioverted and PPM placed, was found to have a minor superior endplate /compression fracture of L5 in addition to L4 which was previously seen.  Patient presented with intractable back pain with radiation to right thigh and right foot after another fall on 04/19/2024.  On presentation, she was found to be in a flutter with RVR, initially treated with Cardizem  drip but switched to amiodarone  drip because of hypotension.  Cardiology was consulted.  Nephrology consulted to continue hemodialysis.  She underwent AV node ablation on 04/23/2024.  Subsequently, EP has signed off and cleared the patient for discharge.  PT recommended SNF placement:  Patient/family refused SNF placement.  She will be discharged home today with outpatient follow-up with PCP/EP/hemodialysis unit as scheduled.  Discharge Diagnoses:  Intractable back pain History of recent superior endplate/minor compression fracture of L4 and L5 Fall -Prior hospitalist discussed with EP: Will need to wait 6 weeks after PPM placement on 03/27/2024 to have MRI - Continue TLSO brace.  Continue pain management.   - PT/OT recommending CIR.  Apparently, patient is not a candidate for CIR.  Patient/family refused SNF placement.  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   -underwent AV node ablation on 04/23/2024.EP has discontinued amiodarone  altogether; no need for the same on discharge.  EP has cleared the patient for discharge.  Outpatient follow-up with EP.  Continue Eliquis    ESRD on HD - Nephrology following.  Dialysis as per nephrology schedule.  Outpatient follow-up with hemodialysis unit as scheduled   Chronic hypotension - Continue midodrine    CAD Chronic diastolic heart failure History of MV/TV repair Hyperlipidemia - Stable.  No chest pain.  Continue statin - Continue diet and fluid restriction.  Volume management as per nephrology by dialysis   GERD - Continue Protonix    Diabetes mellitus type 2 - Carb modified diet.  Continue home regimen.  Outpatient follow-up with PCP.   Anemia of chronic disease Macrocytosis - From chronic illnesses.  Hemoglobin stable.  Monitor intermittently as an outpatient.   History of DVT -Continue Eliquis    OSA - Continue CPAP nightly   COPD - Stable.  Nebs as needed   Anxiety/depression - Continue BuSpar  and duloxetine    Morbid obesity/obesity class III - Outpatient follow-up   Discharge  Instructions  Discharge  Instructions     Diet - low sodium heart healthy   Complete by: As directed    Diet Carb Modified   Complete by: As directed    Increase activity slowly   Complete by: As directed       Allergies as of 04/25/2024       Reactions   Bee Pollen Anaphylaxis, Other (See Comments)   UNCONFIRMED, but IS allergic to bee VENOM   Bee Venom Anaphylaxis   Cefuroxime Itching, Nausea And Vomiting   Sulfa Antibiotics Itching, Rash   Chlorhexidine     Unknown rxn.   Iodinated Contrast Media Nausea And Vomiting        Medication List     STOP taking these medications    amiodarone  200 MG tablet Commonly known as: PACERONE    cephALEXin  250 MG capsule Commonly known as: KEFLEX        TAKE these medications    Accu-Chek Guide Test test strip Generic drug: glucose blood Use to check blood sugar 3 times daily.   Accu-Chek Softclix Lancets lancets Use to check blood sugar 3 times daily.   acetaminophen  325 MG tablet Commonly known as: TYLENOL  Take 2 tablets (650 mg total) by mouth every 6 (six) hours as needed for mild pain or moderate pain. What changed: when to take this   albuterol  108 (90 Base) MCG/ACT inhaler Commonly known as: Proventil  HFA Inhale 1-2 puffs into the lungs every 6 (six) hours as needed for wheezing or shortness of breath.   Auryxia  1 GM 210 MG(Fe) tablet Generic drug: ferric citrate  Take 210 mg by mouth 3 (three) times daily.   busPIRone  5 MG tablet Commonly known as: BUSPAR  TAKE ONE TABLET BY MOUTH TWICE DAILY @9AM -5PM What changed: See the new instructions.   diphenhydrAMINE  25 MG tablet Commonly known as: BENADRYL  Take 25 mg by mouth every 6 (six) hours as needed for itching. What changed: when to take this   doxercalciferol  4 MCG/2ML injection Commonly known as: HECTOROL  Inject 1.5 mLs (3 mcg total) into the vein Every Tuesday,Thursday,and Saturday with dialysis.   DULoxetine  60 MG capsule Commonly known as:  CYMBALTA  TAKE ONE CAPSULE (60MG  TOTAL) BY MOUTH DAILY AT 9AM   Eliquis  5 MG Tabs tablet Generic drug: apixaban  TAKE ONE TABLET (5MG  TOTAL) BY MOUTH TWICE DAILY @9AM -5PM   EPINEPHrine  0.3 mg/0.3 mL Soaj injection Commonly known as: EPI-PEN Inject 0.3 mg into the muscle as needed for anaphylaxis.   Flonase  Allergy Relief 50 MCG/ACT nasal spray Generic drug: fluticasone  Place 2 sprays into both nostrils daily as needed for allergies. What changed: when to take this   gabapentin  100 MG capsule Commonly known as: NEURONTIN  Take 1 capsule (100 mg total) by mouth every Tuesday, Thursday, and Saturday at 6 PM. Start taking on: April 26, 2024   insulin  lispro 100 UNIT/ML KwikPen Commonly known as: HumaLOG  KwikPen For blood sugars 0-150 give 0 units of insulin , 151-200 give 2 units of insulin , 201-250 give 4 units, 251-300 give 6 units, 301-350 give 8 units, 351-400 give 10 units,> 400 give 12 units and call M.D. What changed:  how much to take how to take this when to take this additional instructions   lidocaine -prilocaine  cream Commonly known as: EMLA  Apply 1 Application topically Every Tuesday,Thursday,and Saturday with dialysis.   loratadine  10 MG tablet Commonly known as: CLARITIN  TAKE ONE TABLET (10 mg total) BY MOUTH DAILY AT 9AM EVERY MORNING.   metoCLOPramide  5 MG tablet Commonly known as: Reglan  Take 1 tablet (5  mg total) by mouth every 8 (eight) hours as needed for nausea.   midodrine  10 MG tablet Commonly known as: PROAMATINE  Take 1 tablet (10 mg total) by mouth 2 (two) times daily. What changed: when to take this   MIRCERA IJ 50 mcg.   MULTIVITAMIN WOMEN PO Take 1 tablet by mouth daily.   nitroGLYCERIN  0.4 MG SL tablet Commonly known as: NITROSTAT  Place 1 tablet (0.4 mg total) under the tongue every 5 (five) minutes x 3 doses as needed for chest pain.   oxyCODONE -acetaminophen  5-325 MG tablet Commonly known as: PERCOCET/ROXICET Take 1 tablet by mouth  every 8 (eight) hours as needed for severe pain (pain score 7-10).   pantoprazole  40 MG tablet Commonly known as: PROTONIX  Take 1 tablet (40 mg total) by mouth daily.   polyethylene glycol 17 g packet Commonly known as: MIRALAX  / GLYCOLAX  Take 17 g by mouth daily as needed.   rosuvastatin  10 MG tablet Commonly known as: CRESTOR  TAKE ONE TABLET BY MOUTH DAILY AT 5PM   senna 8.6 MG Tabs tablet Commonly known as: SENOKOT Take 1 tablet (8.6 mg total) by mouth 2 (two) times daily.        Follow-up Information     Celestia Rosaline SQUIBB, NP. Schedule an appointment as soon as possible for a visit in 1 week(s).   Specialty: Internal Medicine Contact information: [address] [place] [address] [phone]                Allergies  Allergen Reactions   Bee Pollen Anaphylaxis and Other (See Comments)    UNCONFIRMED, but IS allergic to bee VENOM   Bee Venom Anaphylaxis   Cefuroxime Itching and Nausea And Vomiting   Sulfa Antibiotics Itching and Rash   Chlorhexidine      Unknown rxn.   Iodinated Contrast Media Nausea And Vomiting    Consultations: EP/Nephrology   Procedures/Studies: EP STUDY Result Date: 04/23/2024 Conclusion: successful AV node ablation with the creation of CHB in a patient with uncontrolled atrial fib/flutter, s/p prior PPM insertion. Dr. Adina Primus assisted with the procedure as the co-surgeon. Danelle Taylor,MD   CT ABDOMEN PELVIS WO CONTRAST Result Date: 04/22/2024 EXAM: CT ABDOMEN AND PELVIS WITHOUT CONTRAST 04/22/2024 04:16:18 AM TECHNIQUE: CT of the abdomen and pelvis was performed without the administration of intravenous contrast. Multiplanar reformatted images are provided for review. Automated exposure control, iterative reconstruction, and/or weight-based adjustment of the mA/kV was utilized to reduce the radiation dose to as low as reasonably achievable. COMPARISON: CT of the abdomen and pelvis dated 04/19/2024. CLINICAL HISTORY:  Abdominal pain, acute, nonlocalized. Back pain s/p fall 5-6 days and was dx with right hip contusion. FINDINGS: LOWER CHEST: There are ground-glass and reticular opacities present dependently within the lower lobes bilaterally. There is mosaic perfusion of the lung bases. LIVER: The liver has a lobular micronodular contour. GALLBLADDER AND BILE DUCTS: The patient is status post cholecystectomy. SPLEEN: Normal size. No focal lesion. PANCREAS: No mass. No ductal dilatation. ADRENAL GLANDS: Normal appearance. No mass. KIDNEYS, URETERS AND BLADDER: No stones in the kidneys or ureters. No hydronephrosis. No perinephric or periureteral stranding. Urinary bladder is unremarkable. GI AND BOWEL: Stomach demonstrates no acute abnormality. There is no bowel obstruction. No bowel wall thickening. PERITONEUM AND RETROPERITONEUM: No ascites. No free air. VASCULATURE: There are peripherally calcified left renal artery aneurysms present, which measure approximately 16 and 14 mm in diameter respectively, similar to the prior exam. LYMPH NODES: No lymphadenopathy. REPRODUCTIVE ORGANS: The patient is status  post hysterectomy and bilateral salpingo-oophorectomy. BONES AND SOFT TISSUES: No acute osseous abnormality. No focal soft tissue abnormality. IMPRESSION: 1. Hepatic cirrhosis 2. Peripherally calcified left renal artery aneurysms, measuring approximately 16 and 14 mm in diameter, similar to the prior exam. Electronically signed by: Evalene Coho MD 04/22/2024 04:31 AM EDT RP Workstation: [id number]   DG Pelvis Portable Result Date: 04/22/2024 CLINICAL DATA:  Fall, back pain EXAM: PORTABLE PELVIS 1-2 VIEWS COMPARISON:  None Available. FINDINGS: There is no evidence of pelvic fracture or diastasis. No pelvic bone lesions are seen. IMPRESSION: Negative. Electronically Signed   By: Dorethia Molt M.D.   On: 04/22/2024 02:27   DG Chest Portable 1 View Result Date: 04/22/2024 CLINICAL DATA:  Fall, back pain EXAM: PORTABLE CHEST 1  VIEW COMPARISON:  03/28/2024 FINDINGS: Lung volumes are small. Mild elevation of the right hemidiaphragm. No focal pulmonary infiltrate. No pneumothorax or pleural effusion. Mitral and tricuspid valve replacement and left atrial clipping has been performed. Lead less pacemaker noted. Cardiac size is enlarged, unchanged. Pulmonary vascularity is normal. IMPRESSION: 1. Pulmonary hypoinflation. 2. Stable cardiomegaly. Electronically Signed   By: Dorethia Molt M.D.   On: 04/22/2024 02:26   CT ABDOMEN PELVIS WO CONTRAST Result Date: 04/19/2024 CLINICAL DATA:  Blunt abdominal trauma. Right hip and low back pain. EXAM: CT ABDOMEN AND PELVIS WITHOUT CONTRAST TECHNIQUE: Multidetector CT imaging of the abdomen and pelvis was performed following the standard protocol without IV contrast. RADIATION DOSE REDUCTION: This exam was performed according to the departmental dose-optimization program which includes automated exposure control, adjustment of the mA and/or kV according to patient size and/or use of iterative reconstruction technique. COMPARISON:  Most recent CT 11/09/2023, concurrent lumbar spine reformats, reported separately. FINDINGS: Lower chest: The heart is enlarged. Heterogeneous pulmonary parenchyma, chronic. No basilar pneumothorax or pleural effusion. Hepatobiliary: Assessment for injury is limited in the absence of IV contrast. Homogeneous hepatic attenuation. No perihepatic hematoma. Clips in the gallbladder fossa postcholecystectomy. No biliary dilatation. Pancreas: Assessment for injury is limited in the absence of IV contrast. No ductal dilatation or inflammation. Spleen: Assessment for injury is limited in the absence of IV contrast. Homogeneous attenuation. No perisplenic hematoma. Adrenals/Urinary Tract: Chronic adrenal thickening without adrenal hematoma. Assessment for renal injury is limited in the absence of IV contrast. No evidence of renal inflammation or hydronephrosis. Partially calcified  left renal artery aneurysm are unchanged appearance from prior, 16 mm anteriorly a and 13 mm posteriorly. The urinary bladder is completely empty and not well assessed. Stomach/Bowel: No evidence of bowel injury or mesenteric hematoma. No bowel wall thickening or inflammatory change. Small to moderate colonic stool burden. Normal appendix. Vascular/Lymphatic: Aortic atherosclerosis. No retroperitoneal fluid to suggest vascular injury. No suspicious lymphadenopathy. Reproductive: Hysterectomy.  Quiescent ovaries. Other: No free fluid. No free air. No confluent body wall contusion. Musculoskeletal: Lumbar spine is assessed on concurrent lumbar spine reformats, reported separately. T11 compression fracture is chronic and unchanged from prior exam. No fracture of the pelvis or right hip. No sacral fracture. No obvious intramuscular hematoma or muscle injury to account for pain. IMPRESSION: 1. No evidence of acute traumatic injury to the abdomen or pelvis. Lumbar spine CT reported separately. 2. Chronic T11 compression fracture. 3. Unchanged left renal artery aneurysms. Aortic Atherosclerosis (ICD10-I70.0). Electronically Signed   By: Andrea Gasman M.D.   On: 04/19/2024 17:11   CT L-SPINE NO CHARGE Result Date: 04/19/2024 CLINICAL DATA:  Right lower back pain after fall. EXAM: CT LUMBAR SPINE WITHOUT CONTRAST TECHNIQUE: Multidetector CT  imaging of the lumbar spine was performed without intravenous contrast administration. Multiplanar CT image reconstructions were also generated. RADIATION DOSE REDUCTION: This exam was performed according to the departmental dose-optimization program which includes automated exposure control, adjustment of the mA and/or kV according to patient size and/or use of iterative reconstruction technique. COMPARISON:  Lumbar spine CT three weeks ago 03/28/2024 FINDINGS: Segmentation: 5 lumbar type vertebrae. Alignment: Normal. Vertebrae: Stable minimal L4 superior endplate compression  deformity. No progressive loss of height. There is slight undulation of the superior endplate of L5 that is new from prior exam. Posterior elements are intact. Paraspinal and other soft tissues: Assessed fully on concurrent abdominopelvic CT, reported separately. There is no paravertebral soft tissue thickening or hematoma, including at the L5 level. Disc levels: Multilevel degenerative disc disease and facet hypertrophy. Degenerative changes at L4-L5 causes spinal canal and neural foraminal stenosis. IMPRESSION: 1. Slight undulation of the superior endplate of L5 is new from exam 3 weeks ago. May represent acute/subacute minor compression fracture. There is no significant loss of height. 2. Stable minimal L4 superior endplate compression deformity. 3. Multilevel degenerative disc disease and facet hypertrophy. Degenerative changes at L4-L5 causes spinal canal and neural foraminal stenosis. Electronically Signed   By: Andrea Gasman M.D.   On: 04/19/2024 17:04   CT Cervical Spine Wo Contrast Result Date: 04/19/2024 CLINICAL DATA:  Neck trauma (Age >= 65y) EXAM: CT CERVICAL SPINE WITHOUT CONTRAST TECHNIQUE: Multidetector CT imaging of the cervical spine was performed without intravenous contrast. Multiplanar CT image reconstructions were also generated. RADIATION DOSE REDUCTION: This exam was performed according to the departmental dose-optimization program which includes automated exposure control, adjustment of the mA and/or kV according to patient size and/or use of iterative reconstruction technique. COMPARISON:  06/18/2023 FINDINGS: Alignment: Straightening of normal lordosis. No traumatic subluxation. Skull base and vertebrae: No acute fracture. Vertebral body heights are maintained. The dens and skull base are intact. Non fusion posterior arch of C1, variant anatomy. Soft tissues and spinal canal: No prevertebral fluid or swelling. No visible canal hematoma. Disc levels:  Mild degenerative changes are  stable from prior exam. Upper chest: Nonacute. Other: None. IMPRESSION: Mild degenerative change in the cervical spine without acute fracture or subluxation. Electronically Signed   By: Andrea Gasman M.D.   On: 04/19/2024 17:00   CT Head Wo Contrast Result Date: 04/19/2024 CLINICAL DATA:  Head trauma, minor (Age >= 65y) EXAM: CT HEAD WITHOUT CONTRAST TECHNIQUE: Contiguous axial images were obtained from the base of the skull through the vertex without intravenous contrast. RADIATION DOSE REDUCTION: This exam was performed according to the departmental dose-optimization program which includes automated exposure control, adjustment of the mA and/or kV according to patient size and/or use of iterative reconstruction technique. COMPARISON:  Head CT 06/18/2023 FINDINGS: Brain: No intracranial hemorrhage, mass effect, or midline shift. No hydrocephalus. The basilar cisterns are patent. No evidence of territorial infarct or acute ischemia. No extra-axial or intracranial fluid collection. Vascular: Atherosclerosis of skullbase vasculature without hyperdense vessel or abnormal calcification. Skull: No fracture or focal lesion. Sinuses/Orbits: No acute finding.  Bilateral cataract resection. Other: No confluent scalp hematoma. IMPRESSION: No acute intracranial abnormality. No skull fracture. Electronically Signed   By: Andrea Gasman M.D.   On: 04/19/2024 16:55   DG HIP UNILAT WITH PELVIS 1V RIGHT Result Date: 03/29/2024 CLINICAL DATA:  Right hip pain. Fall a few weeks ago with pain since that time. EXAM: DG HIP (WITH OR WITHOUT PELVIS) 1V RIGHT COMPARISON:  None Available.  FINDINGS: No evidence of acute or healing fracture of the pelvis or right hip. No hip dislocation. Pubic rami are intact. No pubic symphyseal or sacroiliac diastasis. Mild hip osteoarthritis with joint space narrowing and spurring. No evidence of erosion or avascular necrosis. IMPRESSION: 1. No acute or healing fracture of the pelvis or right  hip. 2. Mild right hip osteoarthritis. Electronically Signed   By: Andrea Gasman M.D.   On: 03/29/2024 16:06   CT LUMBAR SPINE WO CONTRAST Result Date: 03/28/2024 CLINICAL DATA:  Fall, back pain, right lower extremity weakness and numbness EXAM: CT LUMBAR SPINE WITHOUT CONTRAST TECHNIQUE: Multidetector CT imaging of the lumbar spine was performed without intravenous contrast administration. Multiplanar CT image reconstructions were also generated. RADIATION DOSE REDUCTION: This exam was performed according to the departmental dose-optimization program which includes automated exposure control, adjustment of the mA and/or kV according to patient size and/or use of iterative reconstruction technique. COMPARISON:  None Available. FINDINGS: Segmentation: 5 lumbar type vertebrae. Alignment: Normal. Vertebrae: Probable slight compression through the superior endplate of L4. This is new since prior CT from 06/18/2023, but appears similar to prior plain films from 03/22/2024. Paraspinal and other soft tissues: Negative paraspinal soft tissues. 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 Early degenerative spurring. Moderate degenerative facet disease most pronounced in the lower lumbar spine. No focal disc herniation. IMPRESSION: Suspect slight compression fracture through the superior endplate of L4, new since 06/18/2023, but appears similar to prior plain films from 03/22/2024. Electronically Signed   By: Franky Crease M.D.   On: 03/28/2024 16:18   DG Chest 2 View Result Date: 03/28/2024 CLINICAL DATA:  Pacemaker placement. EXAM: CHEST - 2 VIEW COMPARISON:  03/22/2024 FINDINGS: Low volume lordotic frontal projection. The cardio pericardial silhouette is enlarged. Lead less pacemaker overlies the left heart, new in the interval. Vascular congestion without overt edema or focal airspace consolidation. No pneumothorax. Telemetry leads overlie the chest. IMPRESSION:  Interval placement of leadless pacemaker. No pneumothorax. Electronically Signed   By: Camellia Candle M.D.   On: 03/28/2024 05:58   EP PPM/ICD IMPLANT Result Date: 03/27/2024  CONCLUSIONS:  1. Successful single chamber leadless pacemaker implant.  2. No early apparent complications. [name], MD, [facility] Cardiac Electrophysiology      Subjective: Patient seen and examined at bedside.  Continues to have intermittent lower back pain but feels operative on today.  No fever, vomiting, abdominal pain reported.  Discharge Exam: Vitals:   04/25/24 0416 04/25/24 0808  BP: 110/62 121/69  Pulse: 89 81  Resp: 13 16  Temp: 98.2 F (36.8 C) 97.7 F (36.5 C)  SpO2: 98% 100%    General: Pt is alert, awake, not in acute distress.  Left cervical intervention.  On room air. Cardiovascular: rate controlled, S1/S2 + Respiratory: bilateral decreased breath sounds at bases Abdominal: Soft, morbidly obese, NT, ND, bowel sounds + Extremities: Trace lower extremity edema; no cyanosis    The results of significant diagnostics from this hospitalization (including imaging, microbiology, ancillary and laboratory) are listed below for reference.     Microbiology: No results found for this or any previous visit (from the past 240 hours).   Labs: BNP (last 3 results) Recent Labs    02/01/24 1549 03/03/24 1651 03/22/24 0954  BNP 131.9* 253.5* 211.5*   Basic Metabolic Panel: Recent Labs  Lab 04/19/24 1535 04/22/24 0158 04/23/24 0640  NA 139 136 138  K 3.4* 4.8 4.5  CL 94*  93* 92*  CO2 29 25 27   GLUCOSE 136* 139* 117*  BUN 13 41* 26*  CREATININE 4.84* 9.97* 6.74*  CALCIUM  10.0 9.8 9.9   Liver Function Tests: Recent Labs  Lab 04/22/24 0158  AST 19  ALT 10  ALKPHOS 92  BILITOT 1.0  PROT 7.8  ALBUMIN  3.5   Recent Labs  Lab 04/22/24 0158  LIPASE 28   No results for input(s): AMMONIA in the last 168 hours. CBC: Recent Labs  Lab 04/19/24 1535 04/22/24 0158  04/23/24 0640  WBC 5.5 7.0 6.7  NEUTROABS 3.3  --   --   HGB 10.7* 10.6* 10.9*  HCT 34.0* 35.0* 35.3*  MCV 100.3* 101.4* 100.6*  PLT 159 175 178   Cardiac Enzymes: No results for input(s): CKTOTAL, CKMB, CKMBINDEX, TROPONINI in the last 168 hours. BNP: Invalid input(s): POCBNP CBG: Recent Labs  Lab 04/24/24 0634 04/24/24 1130 04/24/24 1851 04/24/24 2116 04/25/24 0701  GLUCAP 125* 138* 181* 177* 112*   D-Dimer No results for input(s): DDIMER in the last 72 hours. Hgb A1c No results for input(s): HGBA1C in the last 72 hours. Lipid Profile No results for input(s): CHOL, HDL, LDLCALC, TRIG, CHOLHDL, LDLDIRECT in the last 72 hours. Thyroid  function studies No results for input(s): TSH, T4TOTAL, T3FREE, THYROIDAB in the last 72 hours.  Invalid input(s): FREET3 Anemia work up No results for input(s): VITAMINB12, FOLATE, FERRITIN, TIBC, IRON , RETICCTPCT in the last 72 hours. Urinalysis    Component Value Date/Time   COLORURINE AMBER (A) 11/07/2022 1447   APPEARANCEUR CLOUDY (A) 11/07/2022 1447   LABSPEC 1.015 11/07/2022 1447   PHURINE 5.0 11/07/2022 1447   GLUCOSEU NEGATIVE 11/07/2022 1447   HGBUR SMALL (A) 11/07/2022 1447   BILIRUBINUR small (A) 02/21/2023 1748   KETONESUR trace (5) (A) 02/21/2023 1748   KETONESUR NEGATIVE 11/07/2022 1447   PROTEINUR =100 (A) 02/21/2023 1748   PROTEINUR 100 (A) 11/07/2022 1447   UROBILINOGEN 0.2 02/21/2023 1748   UROBILINOGEN 1.0 10/26/2014 0249   NITRITE Negative 02/21/2023 1748   NITRITE NEGATIVE 11/07/2022 1447   LEUKOCYTESUR Negative 02/21/2023 1748   LEUKOCYTESUR TRACE (A) 11/07/2022 1447   Sepsis Labs Recent Labs  Lab 04/19/24 1535 04/22/24 0158 04/23/24 0640  WBC 5.5 7.0 6.7   Microbiology No results found for this or any previous visit (from the past 240 hours).   Time coordinating discharge: 35 minutes  SIGNED:   Sophie Mao, MD  [facility] 04/25/2024,  9:13 AM

## 2024-04-25 NOTE — Progress Notes (Signed)
 Discharge Nurse Summary: DC order noted per MD. DC RN at bedside with patient. Patient agreeable with discharge plan, states family will arrive soon for pickup. Home health needs addressed.   AVS printed/reviewed. PIV removed, skin intact. No DME needs or home meds. TOC meds pending pickup. CP/Edu resolved. Telemonitor returned to charging station. All belongings accounted for. See LDAs for more information. Volunteer transport notified of patient discharge with request for transport and TOC med pickup. Patient wheeled downstairs for discharge by volunteer transport via private auto.   Rosario EMERSON Lund, RN

## 2024-04-25 NOTE — Care Management Important Message (Signed)
 Important Message  Patient Details  Name: [name] MRN: [id number] Date of Birth: 1956-02-11   Important Message Given:  Yes - Medicare IM     [name] Sharps 04/25/2024, 1:09 PM

## 2024-04-25 NOTE — TOC Transition Note (Signed)
 Transition of Care (TOC) - Discharge Note Rayfield Gobble RN, BSN Inpatient Care Management Unit 4E- RN Case Manager See Treatment Team for direct phone #   Patient Details  Name: DELONNA NEY MRN: [id number] Date of Birth: 1956/01/08  Transition of Care [facility]) CM/SW Contact:  Gobble Rayfield Hurst, RN Phone Number: 04/25/2024, 10:14 AM   Clinical Narrative:    Pt stable for transition home today, CIR screened pt and noted pt would not likely get insurance approval. Pt has declined SNF and wants to return home.   Orders placed for HHPT/OT.   CM in to speak with pt at bedside. List provided for [name] choice Per CMS guidelines from PhoneFinancing[employer]pl website with star ratings (copy placed in shadow chart)- pt voiced she had HH last month- would like to use same agency- per chart review- pt was set up with [facility].   Discussed DME needs- pt voiced she has rollator, cane, shower chair and BSC at home, declines wheelchair stating she does not have space for it. Also discussed toilet riser and grab bars- pt will follow up on these needs should she decide to get them as insurance does not cover these under DME. Pt declined any other DME needs at this time  Also note on last admit pt had sleep study a few months ago- pt reports that she had not received a CPAP - advised pt to f/u with provider that ordered sleep study regarding CPAP. Pt voiced she will call next week.   Per pt her friend will provide transport home.   Call made to [facility] liaison- [facility] referral has been accepted- they will contact pt for start of care scheduling.   No further IP CM (inpatient care management) needs noted    Final next level of care: Home w Home Health Services Barriers to Discharge: Barriers Resolved   Patient Goals and CMS Choice Patient states their goals for this hospitalization and ongoing recovery are:: return home CMS Medicare.gov Compare Post Acute Care list provided to:: Patient Choice  offered to / list presented to : Patient      Discharge Placement               Home w/ [facility]        Discharge Plan and Services Additional resources added to the After Visit Summary for     Discharge Planning Services: CM Consult Post Acute Care Choice: Home Health, Durable Medical Equipment          DME Arranged: N/A DME Agency: NA       HH Arranged: PT, OT HH Agency: [name] Date [facility] Agency Contacted: 04/25/24 Time HH Agency Contacted: 1013 Representative spoke with at [facility] Agency: [place]  Social Drivers of Health (SDOH) Interventions SDOH Screenings   Food Insecurity: No Food Insecurity (04/22/2024)  Housing: High Risk (04/22/2024)  Transportation Needs: No Transportation Needs (04/22/2024)  Utilities: Not At Risk (04/22/2024)  Alcohol  Screen: Low Risk  (06/04/2023)  Depression (PHQ2-9): Low Risk  (03/31/2024)  Financial Resource Strain: Low Risk  (11/19/2023)  Physical Activity: Insufficiently Active (06/04/2023)  Social Connections: Moderately Isolated (04/22/2024)  Stress: No Stress Concern Present (06/04/2023)  Tobacco Use: Low Risk  (04/22/2024)  Health Literacy: Patient Declined (06/04/2023)     Readmission Risk Interventions    04/25/2024   10:14 AM 03/05/2024   12:22 PM 11/12/2023    4:32 PM  Readmission Risk Prevention Plan  Transportation Screening Complete Complete Complete  PCP or Specialist Appt within 3-5 Days  Complete   HRI or Home Care Consult  Complete Complete  Social Work Consult for Recovery Care Planning/Counseling  Complete Complete  Palliative Care Screening  Not Applicable Not Applicable  Medication Review Oceanographer) Complete Referral to Pharmacy Referral to Pharmacy  PCP or Specialist appointment within 3-5 days of discharge Complete    HRI or Home Care Consult Complete    SW Recovery Care/Counseling Consult Complete    Palliative Care Screening Not Applicable    Skilled Nursing Facility Patient Refused

## 2024-04-26 DIAGNOSIS — Z992 Dependence on renal dialysis: Secondary | ICD-10-CM | POA: Diagnosis not present

## 2024-04-26 DIAGNOSIS — N2581 Secondary hyperparathyroidism of renal origin: Secondary | ICD-10-CM | POA: Diagnosis not present

## 2024-04-26 DIAGNOSIS — N186 End stage renal disease: Secondary | ICD-10-CM | POA: Diagnosis not present

## 2024-04-26 DIAGNOSIS — D631 Anemia in chronic kidney disease: Secondary | ICD-10-CM | POA: Diagnosis not present

## 2024-04-26 DIAGNOSIS — E1129 Type 2 diabetes mellitus with other diabetic kidney complication: Secondary | ICD-10-CM | POA: Diagnosis not present

## 2024-04-26 DIAGNOSIS — D508 Other iron deficiency anemias: Secondary | ICD-10-CM | POA: Diagnosis not present

## 2024-04-26 NOTE — TOC Transition Note (Signed)
 Transition of Care - Initial Contact after Hospitalization  Date of discharge: 04/25/2024  Date of contact: 04/26/24  Method: Phone Spoke to: Patient  Patient contacted to discuss transition of care from recent inpatient hospitalization. Patient was admitted to [facility] from 04/22/24 to 04/25/24 discharge diagnosis of persistent Afib/A-flutter with RVR and intractable back pain.  The discharge medication list was reviewed. Patient understands the changes and has no concerns.   Patient received HD today. Next HD 9/9 at [facility].  Charmaine Piety, NP

## 2024-04-28 ENCOUNTER — Telehealth: Payer: Self-pay | Admitting: *Deleted

## 2024-04-28 NOTE — Transitions of Care (Post Inpatient/ED Visit) (Signed)
   04/28/2024  Name: [name] MRN: [id number] DOB: 1956-05-02  Today's TOC FU Call Status: Today's TOC FU Call Status:: Unsuccessful Call (1st Attempt) Unsuccessful Call (1st Attempt) Date: 04/28/24  Attempted to reach the patient regarding the most recent Inpatient/ED visit.  Patient was unable to take this telephone call and request a call back on Wednesday.  Follow Up Plan: Additional outreach attempts will be made to reach the patient to complete the Transitions of Care (Post Inpatient/ED visit) call.   Andrea Dimes RN, BSN [place]  [facility] RN Care Manager [phone]

## 2024-04-29 DIAGNOSIS — S32059D Unspecified fracture of fifth lumbar vertebra, subsequent encounter for fracture with routine healing: Secondary | ICD-10-CM | POA: Diagnosis not present

## 2024-04-29 DIAGNOSIS — D631 Anemia in chronic kidney disease: Secondary | ICD-10-CM | POA: Diagnosis not present

## 2024-04-29 DIAGNOSIS — E1122 Type 2 diabetes mellitus with diabetic chronic kidney disease: Secondary | ICD-10-CM | POA: Diagnosis not present

## 2024-04-29 DIAGNOSIS — M1990 Unspecified osteoarthritis, unspecified site: Secondary | ICD-10-CM | POA: Diagnosis not present

## 2024-04-29 DIAGNOSIS — I2510 Atherosclerotic heart disease of native coronary artery without angina pectoris: Secondary | ICD-10-CM | POA: Diagnosis not present

## 2024-04-29 DIAGNOSIS — I4819 Other persistent atrial fibrillation: Secondary | ICD-10-CM | POA: Diagnosis not present

## 2024-04-29 DIAGNOSIS — I5042 Chronic combined systolic (congestive) and diastolic (congestive) heart failure: Secondary | ICD-10-CM | POA: Diagnosis not present

## 2024-04-29 DIAGNOSIS — Z992 Dependence on renal dialysis: Secondary | ICD-10-CM | POA: Diagnosis not present

## 2024-04-29 DIAGNOSIS — Z794 Long term (current) use of insulin: Secondary | ICD-10-CM | POA: Diagnosis not present

## 2024-04-29 DIAGNOSIS — G4733 Obstructive sleep apnea (adult) (pediatric): Secondary | ICD-10-CM | POA: Diagnosis not present

## 2024-04-29 DIAGNOSIS — E785 Hyperlipidemia, unspecified: Secondary | ICD-10-CM | POA: Diagnosis not present

## 2024-04-29 DIAGNOSIS — I495 Sick sinus syndrome: Secondary | ICD-10-CM | POA: Diagnosis not present

## 2024-04-29 DIAGNOSIS — S32049D Unspecified fracture of fourth lumbar vertebra, subsequent encounter for fracture with routine healing: Secondary | ICD-10-CM | POA: Diagnosis not present

## 2024-04-29 DIAGNOSIS — I9589 Other hypotension: Secondary | ICD-10-CM | POA: Diagnosis not present

## 2024-04-29 DIAGNOSIS — Z9181 History of falling: Secondary | ICD-10-CM | POA: Diagnosis not present

## 2024-04-29 DIAGNOSIS — J4489 Other specified chronic obstructive pulmonary disease: Secondary | ICD-10-CM | POA: Diagnosis not present

## 2024-04-29 DIAGNOSIS — Z950 Presence of cardiac pacemaker: Secondary | ICD-10-CM | POA: Diagnosis not present

## 2024-04-29 DIAGNOSIS — I132 Hypertensive heart and chronic kidney disease with heart failure and with stage 5 chronic kidney disease, or end stage renal disease: Secondary | ICD-10-CM | POA: Diagnosis not present

## 2024-04-29 DIAGNOSIS — Z7901 Long term (current) use of anticoagulants: Secondary | ICD-10-CM | POA: Diagnosis not present

## 2024-04-29 DIAGNOSIS — N186 End stage renal disease: Secondary | ICD-10-CM | POA: Diagnosis not present

## 2024-04-29 DIAGNOSIS — I4891 Unspecified atrial fibrillation: Secondary | ICD-10-CM | POA: Diagnosis not present

## 2024-04-30 ENCOUNTER — Telehealth: Payer: Self-pay | Admitting: *Deleted

## 2024-04-30 NOTE — Transitions of Care (Post Inpatient/ED Visit) (Signed)
   04/30/2024  Name: [name] MRN: [id number] DOB: [birth year]  Today's TOC FU Call Status: Today's TOC FU Call Status:: Unsuccessful Call (2nd Attempt) Unsuccessful Call (2nd Attempt) Date: 04/30/24  Attempted to reach the patient regarding the most recent Inpatient/ED visit.  Follow Up Plan: Additional outreach attempts will be made to reach the patient to complete the Transitions of Care (Post Inpatient/ED visit) call.   Andrea Dimes RN, BSN [place]  [facility] Health RN Care Manager [phone]

## 2024-05-01 ENCOUNTER — Telehealth: Payer: Self-pay | Admitting: *Deleted

## 2024-05-01 DIAGNOSIS — D508 Other iron deficiency anemias: Secondary | ICD-10-CM | POA: Diagnosis not present

## 2024-05-01 DIAGNOSIS — N2581 Secondary hyperparathyroidism of renal origin: Secondary | ICD-10-CM | POA: Diagnosis not present

## 2024-05-01 DIAGNOSIS — D631 Anemia in chronic kidney disease: Secondary | ICD-10-CM | POA: Diagnosis not present

## 2024-05-01 DIAGNOSIS — E1129 Type 2 diabetes mellitus with other diabetic kidney complication: Secondary | ICD-10-CM | POA: Diagnosis not present

## 2024-05-01 DIAGNOSIS — N186 End stage renal disease: Secondary | ICD-10-CM | POA: Diagnosis not present

## 2024-05-01 DIAGNOSIS — Z992 Dependence on renal dialysis: Secondary | ICD-10-CM | POA: Diagnosis not present

## 2024-05-01 NOTE — Transitions of Care (Post Inpatient/ED Visit) (Signed)
   05/01/2024  Name: [name] MRN: [id number] DOB: 02-09-1956  Today's TOC FU Call Status: Today's TOC FU Call Status:: Unsuccessful Call (3rd Attempt) Unsuccessful Call (3rd Attempt) Date: 05/01/24  Attempted to reach the patient regarding the most recent Inpatient/ED visit.  Follow Up Plan: No further outreach attempts will be made at this time. We have been unable to contact the patient.  Andrea Dimes RN, BSN [place]  [facility] Health RN Care Manager [phone]

## 2024-05-03 DIAGNOSIS — N2581 Secondary hyperparathyroidism of renal origin: Secondary | ICD-10-CM | POA: Diagnosis not present

## 2024-05-03 DIAGNOSIS — D508 Other iron deficiency anemias: Secondary | ICD-10-CM | POA: Diagnosis not present

## 2024-05-03 DIAGNOSIS — Z992 Dependence on renal dialysis: Secondary | ICD-10-CM | POA: Diagnosis not present

## 2024-05-03 DIAGNOSIS — E1129 Type 2 diabetes mellitus with other diabetic kidney complication: Secondary | ICD-10-CM | POA: Diagnosis not present

## 2024-05-03 DIAGNOSIS — D631 Anemia in chronic kidney disease: Secondary | ICD-10-CM | POA: Diagnosis not present

## 2024-05-04 NOTE — Progress Notes (Deleted)
 Electrophysiology Office Note:   Date:  05/04/2024  ID:  [name], DOB 29-Sep-1955, MRN [id number]  Primary Cardiologist: Shelda Bruckner, MD Electrophysiologist: OLE ONEIDA HOLTS, MD  {Click to update primary MD,subspecialty MD or APP then REFRESH:1}    History of Present Illness:   [name] is a 68 y.o. female with h/o type 2 DM, morbid obesity, ESRD on HD, severe MR s/p Mitral and Tricuspid Valvular ring annuloplasty, Left and Right sided MAZE procedure, and clipping of her Left Atrial Appendage in 2022, persistent atrial fibrillation and atrial flutter status post leadless pacemaker insertion and AV node ablation who is being seen today for posthospital discharge follow-up.  She underwent leadless pacemaker insertion on 03/27/2024 then AV node ablation on 04/23/2024.  Discussed the use of AI scribe software for clinical note transcription with the patient, who gave verbal consent to proceed.  History of Present Illness     Review of systems complete and found to be negative unless listed in HPI.   EP Information / Studies Reviewed:    {EKGtoday:28818}     Echo 10/15/2023:  1. 3D EF 69%. Left ventricular ejection fraction, by estimation, is 55 to  60%. The left ventricle has normal function. The left ventricle has no  regional wall motion abnormalities. Left ventricular diastolic parameters  are indeterminate.   2. Right ventricular systolic function is mildly reduced. The right  ventricular size is mildly enlarged.   3. Left atrial size was severely dilated.   4. Right atrial size was severely dilated.   5. Post repair with ring Mild residual MR Mean diastolic gradient 8 mmHg  at HR 68 bpm. The mitral valve has been repaired/replaced. No evidence of  mitral valve regurgitation. No evidence of mitral stenosis.   6. Post repair with ring. The tricuspid valve is has been  repaired/replaced. Tricuspid valve regurgitation is moderate.   7. The  aortic valve is tricuspid. Aortic valve regurgitation is not  visualized. No aortic stenosis is present.   8. The inferior vena cava is normal in size with greater than 50%  respiratory variability, suggesting right atrial pressure of 3 mmHg.   Coronary CTA 12/26/2023: IMPRESSION: 1. Coronary calcium  score of 58.9. This was 48 percentile for age and sex matched control.   2. Normal coronary origin with right dominance.   3. CAD-RADS 2. Mild non-obstructive CAD (25-49%). Consider non-atherosclerotic causes of chest pain. Consider preventive therapy and risk factor modification.   4. Total plaque volume 56 mm3 which is 26th percentile for age- and sex-matched controls (calcified plaque 15 mm3; non-calcified plaque 41 mm3,Low attenuation 1 mm3).   5. The pulmonary artery is mildly dilated (3.25 cm).   6. Status post annuloplasty of the mitral and tricuspid valve, annuloplasty ring well seated.   7. Status post left atrial appendage closure device.   8. Right atrium and right ventricle are both enlarged.   Risk Assessment/Calculations:    CHA2DS2-VASc Score = 6  {Confirm score is correct.  If not, click here to update score.  REFRESH note.  :1} This indicates a 9.7% annual risk of stroke. The patient's score is based upon: CHF History: 1 HTN History: 1 Diabetes History: 1 Stroke History: 0 Vascular Disease History: 1 Age Score: 1 Gender Score: 1   {This patient has a significant risk of stroke if diagnosed with atrial fibrillation.  Please consider VKA or DOAC agent for anticoagulation if the bleeding risk is acceptable.   You can also use the SmartPhrase .  HCCHADSVASC for documentation.   :[id number]} No BP recorded.  {Refresh Note OR Click here to enter BP  :1}***        Physical Exam:   VS:  There were no vitals taken for this visit.   Wt Readings from Last 3 Encounters:  04/24/24 289 lb 3.9 oz (131.2 kg)  04/19/24 252 lb (114.3 kg)  03/29/24 284 lb 9.8 oz (129.1 kg)      GEN: Well nourished, well developed in no acute distress NECK: No JVD CARDIAC: {EPRHYTHM:28826}, no murmurs, rubs, gallops RESPIRATORY:  Clear to auscultation without rales, wheezing or rhonchi  ABDOMEN: Soft, non-distended EXTREMITIES:  No edema; No deformity   ASSESSMENT AND PLAN:    #.  Status post leadless pacemaker:  #. Permanent atrial fibrillation/flutter: #.  Status post AV node ablation: Assessment & Plan       Follow up with {EPMDS:28135::EP Team} {EPFOLLOW LE:71826}  Signed, [name], MD

## 2024-05-05 ENCOUNTER — Ambulatory Visit: Attending: Internal Medicine

## 2024-05-05 ENCOUNTER — Ambulatory Visit: Admitting: Cardiology

## 2024-05-05 DIAGNOSIS — I4819 Other persistent atrial fibrillation: Secondary | ICD-10-CM

## 2024-05-05 DIAGNOSIS — I442 Atrioventricular block, complete: Secondary | ICD-10-CM

## 2024-05-05 NOTE — Progress Notes (Signed)
 Patient scheduled to come in today s/p AV node ablation on 04/23/24 to turn her LRL down from 90 to 80.   Device interrogation (MICRA) performed with Guidel (MDT rep assistance).  Presenting:  AFlutter/VP 90.  Impedance, sensing and pacing threshold tests all normal and stable. Adjustments made to fine tune sensing of Pwaves and LRL lowered to 80 per Dr. Shaune orders. See attached saved report in PACEART and exported to EPIC.   Reviewed next follow up appointment in November with Dr. Kennyth to ensure patient was aware. She has no further questions. She is enrolled in remote monitoring.

## 2024-05-06 DIAGNOSIS — S32030A Wedge compression fracture of third lumbar vertebra, initial encounter for closed fracture: Secondary | ICD-10-CM | POA: Diagnosis not present

## 2024-05-06 DIAGNOSIS — S32050A Wedge compression fracture of fifth lumbar vertebra, initial encounter for closed fracture: Secondary | ICD-10-CM | POA: Diagnosis not present

## 2024-05-06 DIAGNOSIS — D508 Other iron deficiency anemias: Secondary | ICD-10-CM | POA: Diagnosis not present

## 2024-05-06 DIAGNOSIS — S32040A Wedge compression fracture of fourth lumbar vertebra, initial encounter for closed fracture: Secondary | ICD-10-CM | POA: Diagnosis not present

## 2024-05-07 DIAGNOSIS — Z794 Long term (current) use of insulin: Secondary | ICD-10-CM | POA: Diagnosis not present

## 2024-05-07 DIAGNOSIS — Z9181 History of falling: Secondary | ICD-10-CM | POA: Diagnosis not present

## 2024-05-07 DIAGNOSIS — M1990 Unspecified osteoarthritis, unspecified site: Secondary | ICD-10-CM | POA: Diagnosis not present

## 2024-05-07 DIAGNOSIS — N186 End stage renal disease: Secondary | ICD-10-CM | POA: Diagnosis not present

## 2024-05-07 DIAGNOSIS — E785 Hyperlipidemia, unspecified: Secondary | ICD-10-CM | POA: Diagnosis not present

## 2024-05-07 DIAGNOSIS — I5042 Chronic combined systolic (congestive) and diastolic (congestive) heart failure: Secondary | ICD-10-CM | POA: Diagnosis not present

## 2024-05-07 DIAGNOSIS — I2510 Atherosclerotic heart disease of native coronary artery without angina pectoris: Secondary | ICD-10-CM | POA: Diagnosis not present

## 2024-05-07 DIAGNOSIS — I4819 Other persistent atrial fibrillation: Secondary | ICD-10-CM | POA: Diagnosis not present

## 2024-05-07 DIAGNOSIS — I132 Hypertensive heart and chronic kidney disease with heart failure and with stage 5 chronic kidney disease, or end stage renal disease: Secondary | ICD-10-CM | POA: Diagnosis not present

## 2024-05-07 DIAGNOSIS — I4891 Unspecified atrial fibrillation: Secondary | ICD-10-CM | POA: Diagnosis not present

## 2024-05-07 DIAGNOSIS — I9589 Other hypotension: Secondary | ICD-10-CM | POA: Diagnosis not present

## 2024-05-07 DIAGNOSIS — J4489 Other specified chronic obstructive pulmonary disease: Secondary | ICD-10-CM | POA: Diagnosis not present

## 2024-05-07 DIAGNOSIS — Z992 Dependence on renal dialysis: Secondary | ICD-10-CM | POA: Diagnosis not present

## 2024-05-07 DIAGNOSIS — Z7901 Long term (current) use of anticoagulants: Secondary | ICD-10-CM | POA: Diagnosis not present

## 2024-05-07 DIAGNOSIS — E1122 Type 2 diabetes mellitus with diabetic chronic kidney disease: Secondary | ICD-10-CM | POA: Diagnosis not present

## 2024-05-07 DIAGNOSIS — D631 Anemia in chronic kidney disease: Secondary | ICD-10-CM | POA: Diagnosis not present

## 2024-05-07 DIAGNOSIS — G4733 Obstructive sleep apnea (adult) (pediatric): Secondary | ICD-10-CM | POA: Diagnosis not present

## 2024-05-07 DIAGNOSIS — S32049D Unspecified fracture of fourth lumbar vertebra, subsequent encounter for fracture with routine healing: Secondary | ICD-10-CM | POA: Diagnosis not present

## 2024-05-07 DIAGNOSIS — S32059D Unspecified fracture of fifth lumbar vertebra, subsequent encounter for fracture with routine healing: Secondary | ICD-10-CM | POA: Diagnosis not present

## 2024-05-07 DIAGNOSIS — Z950 Presence of cardiac pacemaker: Secondary | ICD-10-CM | POA: Diagnosis not present

## 2024-05-07 DIAGNOSIS — I495 Sick sinus syndrome: Secondary | ICD-10-CM | POA: Diagnosis not present

## 2024-05-08 DIAGNOSIS — D508 Other iron deficiency anemias: Secondary | ICD-10-CM | POA: Diagnosis not present

## 2024-05-09 ENCOUNTER — Telehealth (INDEPENDENT_AMBULATORY_CARE_PROVIDER_SITE_OTHER): Payer: Self-pay | Admitting: Primary Care

## 2024-05-09 NOTE — Telephone Encounter (Signed)
 FYI

## 2024-05-09 NOTE — Telephone Encounter (Signed)
 Copied from CRM [id number]. Topic: Clinical - Home Health Verbal Orders >> May 09, 2024  3:37 PM Everette C wrote: Caller/Agency: [place] / [facility]  Callback Number: [phone] Service Requested: Occupational Therapy  Evaluation has been moved to the week of 05/11/24

## 2024-05-13 ENCOUNTER — Emergency Department (HOSPITAL_COMMUNITY)

## 2024-05-13 ENCOUNTER — Emergency Department (HOSPITAL_COMMUNITY)
Admission: EM | Admit: 2024-05-13 | Discharge: 2024-05-13 | Disposition: A | Discharge status: Home / Self Care | Attending: Emergency Medicine | Admitting: Emergency Medicine

## 2024-05-13 ENCOUNTER — Other Ambulatory Visit: Payer: Self-pay

## 2024-05-13 DIAGNOSIS — R002 Palpitations: Secondary | ICD-10-CM | POA: Insufficient documentation

## 2024-05-13 DIAGNOSIS — K219 Gastro-esophageal reflux disease without esophagitis: Secondary | ICD-10-CM | POA: Diagnosis not present

## 2024-05-13 DIAGNOSIS — R112 Nausea with vomiting, unspecified: Secondary | ICD-10-CM | POA: Diagnosis not present

## 2024-05-13 DIAGNOSIS — R059 Cough, unspecified: Secondary | ICD-10-CM | POA: Diagnosis not present

## 2024-05-13 DIAGNOSIS — I447 Left bundle-branch block, unspecified: Secondary | ICD-10-CM | POA: Diagnosis not present

## 2024-05-13 DIAGNOSIS — R0989 Other specified symptoms and signs involving the circulatory and respiratory systems: Secondary | ICD-10-CM | POA: Diagnosis not present

## 2024-05-13 DIAGNOSIS — I4891 Unspecified atrial fibrillation: Secondary | ICD-10-CM | POA: Diagnosis not present

## 2024-05-13 DIAGNOSIS — E1122 Type 2 diabetes mellitus with diabetic chronic kidney disease: Secondary | ICD-10-CM | POA: Insufficient documentation

## 2024-05-13 DIAGNOSIS — N186 End stage renal disease: Secondary | ICD-10-CM | POA: Insufficient documentation

## 2024-05-13 DIAGNOSIS — I509 Heart failure, unspecified: Secondary | ICD-10-CM | POA: Diagnosis not present

## 2024-05-13 DIAGNOSIS — I120 Hypertensive chronic kidney disease with stage 5 chronic kidney disease or end stage renal disease: Secondary | ICD-10-CM | POA: Diagnosis not present

## 2024-05-13 DIAGNOSIS — I132 Hypertensive heart and chronic kidney disease with heart failure and with stage 5 chronic kidney disease, or end stage renal disease: Secondary | ICD-10-CM | POA: Insufficient documentation

## 2024-05-13 DIAGNOSIS — Z8616 Personal history of COVID-19: Secondary | ICD-10-CM | POA: Diagnosis not present

## 2024-05-13 DIAGNOSIS — R079 Chest pain, unspecified: Secondary | ICD-10-CM | POA: Diagnosis not present

## 2024-05-13 DIAGNOSIS — Z992 Dependence on renal dialysis: Secondary | ICD-10-CM | POA: Diagnosis not present

## 2024-05-13 DIAGNOSIS — R0789 Other chest pain: Secondary | ICD-10-CM | POA: Insufficient documentation

## 2024-05-13 DIAGNOSIS — J449 Chronic obstructive pulmonary disease, unspecified: Secondary | ICD-10-CM | POA: Insufficient documentation

## 2024-05-13 LAB — COMPREHENSIVE METABOLIC PANEL WITH GFR
ALT: 9 U/L (ref 0–44)
AST: 17 U/L (ref 15–41)
Albumin: 3.5 g/dL (ref 3.5–5.0)
Alkaline Phosphatase: 94 U/L (ref 38–126)
Anion gap: 17 — ABNORMAL HIGH (ref 5–15)
BUN: 36 mg/dL — ABNORMAL HIGH (ref 8–23)
CO2: 26 mmol/L (ref 22–32)
Calcium: 9.9 mg/dL (ref 8.9–10.3)
Chloride: 94 mmol/L — ABNORMAL LOW (ref 98–111)
Creatinine, Ser: 10.91 mg/dL — ABNORMAL HIGH (ref 0.44–1.00)
GFR, Estimated: 4 mL/min — ABNORMAL LOW (ref >60)
Glucose, Bld: 105 mg/dL — ABNORMAL HIGH (ref 70–99)
Potassium: 4.6 mmol/L (ref 3.5–5.1)
Sodium: 137 mmol/L (ref 135–145)
Total Bilirubin: 0.7 mg/dL (ref 0.0–1.2)
Total Protein: 7.7 g/dL (ref 6.5–8.1)

## 2024-05-13 LAB — CBC WITH DIFFERENTIAL/PLATELET
Abs Immature Granulocytes: 0.01 K/uL (ref 0.00–0.07)
Basophils Absolute: 0 K/uL (ref 0.0–0.1)
Basophils Relative: 1 %
Eosinophils Absolute: 0.1 K/uL (ref 0.0–0.5)
Eosinophils Relative: 2 %
HCT: 36.7 % (ref 36.0–46.0)
Hemoglobin: 11.7 g/dL — ABNORMAL LOW (ref 12.0–15.0)
Immature Granulocytes: 0 %
Lymphocytes Relative: 17 %
Lymphs Abs: 0.8 K/uL (ref 0.7–4.0)
MCH: 31.1 pg (ref 26.0–34.0)
MCHC: 31.9 g/dL (ref 30.0–36.0)
MCV: 97.6 fL (ref 80.0–100.0)
Monocytes Absolute: 0.8 K/uL (ref 0.1–1.0)
Monocytes Relative: 18 %
Neutro Abs: 2.8 K/uL (ref 1.7–7.7)
Neutrophils Relative %: 62 %
Platelets: 114 K/uL — ABNORMAL LOW (ref 150–400)
RBC: 3.76 MIL/uL — ABNORMAL LOW (ref 3.87–5.11)
RDW: 14.3 % (ref 11.5–15.5)
WBC: 4.5 K/uL (ref 4.0–10.5)
nRBC: 0 % (ref 0.0–0.2)

## 2024-05-13 LAB — LIPASE, BLOOD: Lipase: 15 U/L (ref 11–51)

## 2024-05-13 LAB — MAGNESIUM: Magnesium: 2.3 mg/dL (ref 1.7–2.4)

## 2024-05-13 LAB — TROPONIN I (HIGH SENSITIVITY)
Troponin I (High Sensitivity): 12 ng/L (ref <18)
Troponin I (High Sensitivity): 11 ng/L (ref <18)

## 2024-05-13 MED ORDER — ONDANSETRON 4 MG PO TBDP
4.0000 mg | ORAL_TABLET | Freq: Once | ORAL | Status: AC
  Administered 2024-05-13: 4 mg via ORAL
  Filled 2024-05-13: qty 1

## 2024-05-13 MED ORDER — HEPARIN SODIUM (PORCINE) 1000 UNIT/ML DIALYSIS
3000.0000 [IU] | INTRAMUSCULAR | Status: DC | PRN
  Administered 2024-05-13: 3000 [IU] via INTRAVENOUS_CENTRAL
  Filled 2024-05-13: qty 3

## 2024-05-13 MED ORDER — MIDODRINE HCL 5 MG PO TABS
10.0000 mg | ORAL_TABLET | Freq: Once | ORAL | Status: DC
Start: 2024-05-13 — End: 2024-05-14

## 2024-05-13 MED ORDER — LIDOCAINE HCL (PF) 1 % IJ SOLN: 5.0000 mL | INTRAMUSCULAR | Status: DC | PRN

## 2024-05-13 MED ORDER — PENTAFLUOROPROP-TETRAFLUOROETH EX AERO: 1.0000 | INHALATION_SPRAY | CUTANEOUS | Status: DC | PRN

## 2024-05-13 MED ORDER — LIDOCAINE-PRILOCAINE 2.5-2.5 % EX CREA: 1.0000 | TOPICAL_CREAM | CUTANEOUS | Status: DC | PRN

## 2024-05-13 NOTE — ED Notes (Signed)
Patient was given ice chips. 

## 2024-05-13 NOTE — ED Notes (Signed)
 Unable to place line, Phlebotomy asked to stick for labs.

## 2024-05-13 NOTE — Progress Notes (Signed)
 Patient ID: [name], female   DOB: 10-27-1955, 68 y.o.   MRN: [id number]  Consulted by Dr. Darra from the [facility] ER to provide hemodialysis for this patient who missed her scheduled dialysis today at [facility] because she was in the ER for chest pain evaluation/work up. She unfortunately can't make it back to the dialysis unit in time today if discharged from the ER at this time. ED patient dialysis orders placed with the plan to send her back to the ER for triage and discharge thereafter.   Gordy Blanch MD [facility]. Office # [phone] Pager # [phone] 11:28 AM

## 2024-05-13 NOTE — Discharge Instructions (Signed)
 Thank you for coming to [facility] Emergency Department. You were seen for chest pain.  You were taken to dialysis and your symptoms improved.  You were offered additional workup in the emergency department but he declined.  Please take your medications as prescribed including your midodrine .  Please follow-up with your cardiologist tomorrow as originally scheduled.  Do not hesitate to return to the ED or call 911 if you experience: -Worsening symptoms -Lightheadedness, passing out -Fevers/chills -Anything else that concerns you

## 2024-05-13 NOTE — ED Notes (Signed)
 Pt rates her chest pain 9/10 but wants to go home, very agitated that she is in the hall.

## 2024-05-13 NOTE — ED Provider Notes (Signed)
 8:10 PM Assumed care of patient.   In brief, this is a 68 y.o. female who presented with chest pain and was taken to dialysis. Trop negative, CXR negative.   BP 97/60   Pulse 68   Temp (!) 97.5 F (36.4 C)   Resp 14   Ht 5' 7 (1.702 m)   Wt 120.7 kg   SpO2 96%   BMI 41.66 kg/m    ED Course:   Clinical Course as of 05/13/24 2026  Tue May 13, 2024  2024 Patient reevaluated after she returned from dialysis.   She she states that she would like to be discharged.  That she does not want any additional workup and would like to be discharged now.  Her blood pressure is mildly low but patient does take midodrine  daily and has not received it today.  Will give her 10 mg home dose of midodrine  here now before discharge.  She does not feel lightheaded.  She states that she has an appointment tomorrow with her cardiologist.  She is instructed to take her home medications as prescribed including her midodrine .  Given discharge instructions and return precautions, all questions answered to patient satisfaction. [HN]    Clinical Course User Index [HN] Franklyn Sid SAILOR, MD    Dispo: DC ------------------------------- Sid Franklyn, MD Emergency Medicine  This note was created using dictation software, which may contain spelling or grammatical errors.   Franklyn Sid SAILOR, MD 05/13/24 2027

## 2024-05-13 NOTE — ED Provider Notes (Signed)
 Emergency Department Provider Note   I have reviewed the triage vital signs and the nursing notes.   HISTORY  Chief Complaint No chief complaint on file.   HPI [name] is a 68 y.o. female past history reviewed below including A-fib, CHF, COPD, ESRD presents to the emergency department for evaluation of palpitations with nausea and vomiting.  Patient has been feeling symptoms for the past 2 days.  She remains compliant with her medications and went to dialysis last on Saturday.  She made it through her session but was feeling poorly.  She continued to have nausea with some vomiting without severe abdominal pain.  She has some discomfort in her chest.  She called her HD clinic today who advised she present to the ED rather than going to dialysis.  No shortness of breath.  No syncope.   Past Medical History:  Diagnosis Date   Acute on chronic systolic CHF (congestive heart failure) (HCC) 12/21/2020   Allergic rhinitis    Anemia    Arthritis    Asthma    Atrial fibrillation with RVR (HCC) 11/09/2023   Chronic diastolic CHF (congestive heart failure) (HCC)    Chronic kidney disease    Dialysis Tu/TH/Sa   COPD (chronic obstructive pulmonary disease) (HCC)    COVID-19 03/28/2023   Depression    DM (diabetes mellitus) (HCC)    DVT (deep vein thrombosis) in pregnancy    Gastroenteritis 03/27/2023   GERD (gastroesophageal reflux disease)    HCAP (healthcare-associated pneumonia) 06/21/2022   HTN (hypertension)    Hyperlipidemia    Obesity    Pneumonia 04/2018   RIGHT LOBE   Sleep apnea    needs to be retested - to get new cpap    Review of Systems  Constitutional: No fever/chills Cardiovascular: Positive chest pain and palpitations.  Respiratory: Denies shortness of breath. Gastrointestinal: No abdominal pain.  No nausea, no vomiting.  . Skin: Negative for rash. Neurological: Negative for  headaches.  ____________________________________________   PHYSICAL EXAM:  VITAL SIGNS: Vitals:   05/13/24 1852 05/13/24 2010  BP: 97/60 (!) 103/49  Pulse: 68 78  Resp: 14 16  Temp: (!) 97.5 F (36.4 C)   SpO2: 96% 98%    Constitutional: Alert and oriented. Well appearing and in no acute distress. Eyes: Conjunctivae are normal.  Head: Atraumatic. Nose: No congestion/rhinnorhea. Mouth/Throat: Mucous membranes are moist.  Neck: No stridor.   Cardiovascular: Irregular but rate controlled. Good peripheral circulation. Grossly normal heart sounds.   Respiratory: Normal respiratory effort.  No retractions. Lungs CTAB. Gastrointestinal: Soft and nontender. No distention.  Musculoskeletal: No lower extremity tenderness nor edema. No gross deformities of extremities. Neurologic:  Normal speech and language. No gross focal neurologic deficits are appreciated.   ____________________________________________   LABS (all labs ordered are listed, but only abnormal results are displayed)  Labs Reviewed  COMPREHENSIVE METABOLIC PANEL WITH GFR - Abnormal; Notable for the following components:      Result Value   Chloride 94 (*)    Glucose, Bld 105 (*)    BUN 36 (*)    Creatinine, Ser 10.91 (*)    GFR, Estimated 4 (*)    Anion gap 17 (*)    All other components within normal limits  CBC WITH DIFFERENTIAL/PLATELET - Abnormal; Notable for the following components:   RBC 3.76 (*)    Hemoglobin 11.7 (*)    Platelets 114 (*)    All other components within normal limits  LIPASE, BLOOD  MAGNESIUM   TROPONIN I (HIGH SENSITIVITY)  TROPONIN I (HIGH SENSITIVITY)   ____________________________________________  EKG  Atrial flutter. No STEMI.   ____________________________________________  RADIOLOGY  DG Chest 2 View Result Date: 05/13/2024 EXAM: 2 VIEW(S) XRAY OF THE CHEST 05/13/2024 07:46:00 AM COMPARISON: 04/22/2024 CLINICAL HISTORY: CP. Table formatting from the original note was  not included. Images from the original note were not included. Reason for exam: CP. Per triage notes: According to [facility]: Pt is coming from home calling for chest pain, has been complaining of chest pain for 3x days. Had a prescption of pantoprazole  which they have bene non compliant for for acid reflux. 12 lead showed a fib 60-80. FINDINGS: LUNGS AND PLEURA: Low lung volumes. Elevated right hemidiaphragm. No focal pulmonary opacity. No pulmonary edema. No pleural effusion. No pneumothorax. HEART AND MEDIASTINUM: Cardiac valve replacement noted. Left atrial appendage occlusion device noted. Cardiac loop recorder or leads noted. Cardiomegaly. Post median sternotomy. BONES AND SOFT TISSUES: No acute osseous abnormality. IMPRESSION: 1. No acute cardiopulmonary process. 2. Cardiomegaly with cardiac valve replacement, left atrial appendage occlusion device, and cardiac loop recorder/leads. 3. Post median sternotomy. 4. Low lung volumes with elevated right hemidiaphragm. Electronically signed by: Waddell Calk MD 05/13/2024 08:05 AM EDT RP Workstation: [id number]    ____________________________________________   PROCEDURES  Procedure(s) performed:   Procedures  None  ____________________________________________   INITIAL IMPRESSION / ASSESSMENT AND PLAN / ED COURSE  Pertinent labs & imaging results that were available during my care of the patient were reviewed by me and considered in my medical decision making (see chart for details).   This patient is Presenting for Evaluation of CP, which does require a range of treatment options, and is a complaint that involves a high risk of morbidity and mortality.  The Differential Diagnoses includes but is not exclusive to acute coronary syndrome, aortic dissection, pulmonary embolism, cardiac tamponade, community-acquired pneumonia, pericarditis, musculoskeletal chest wall pain, etc.   Critical Interventions-    Medications  ondansetron   (ZOFRAN -ODT) disintegrating tablet 4 mg (4 mg Oral Given 05/13/24 0819)  ondansetron  (ZOFRAN -ODT) disintegrating tablet 4 mg (4 mg Oral Given 05/13/24 1548)    Reassessment after intervention: symptoms improved.   I decided to review pertinent External Data, and in summary patient underwent AV node ablation on 9/3 with electrophysiology. No longer on Amiodarone .   Clinical Laboratory Tests Ordered, included troponin normal.  Potassium 4.6. Mg normal.   Radiologic Tests Ordered, included CXR. I independently interpreted the images and agree with radiology interpretation.   Cardiac Monitor Tracing which shows A flutter.   Social Determinants of Health Risk patient is a non-smoker.   Consult complete with Cardiology to discus case. With AV node ablation no need for additional intervention on A flutter.   Nephrology Dr. Tobie. Plan for HD and return to the ED for further evaluation after.   Medical Decision Making: Summary:  The patient presents the emergency department for evaluation of palpitations with chest discomfort and vomiting.  Recently underwent AV node ablation earlier this month but appears to be back in atrial flutter although rate controlled.  No hypotension.  Plan for screening blood work.  Patient is due for dialysis today. Will follow labs and coordinate.   Reevaluation with update and discussion with patient. Plan for admit.   Patient's presentation is most consistent with acute presentation with potential threat to life or bodily function.   Disposition: admit  ____________________________________________  FINAL CLINICAL IMPRESSION(S) / ED DIAGNOSES  Final diagnoses:  Chest  pain, unspecified type  ESRD (end stage renal disease) on dialysis [facility])    Note:  This document was prepared using Dragon voice recognition software and may include unintentional dictation errors.  Fonda Law, MD, [facility] Emergency Medicine    Lonn Im, Fonda MATSU, MD 05/22/24 [phone]

## 2024-05-13 NOTE — ED Notes (Signed)
 Report given to jenifer RN of dialysis .

## 2024-05-13 NOTE — ED Notes (Signed)
 Pt requesting to leave, did not want to wait on discharge paperwork. Pt wheeled to lobby with granddaughter and assisted in the car

## 2024-05-13 NOTE — ED Triage Notes (Addendum)
 According to [facility]:Pt is coming from home calling for chest pain, has been complaining of chest pain for 3x days. Had a prescption of pantoprazole  which they have bene non compliant for for acid reflux.12 lead showed a fib 60-80.  Pt refused asprin during ride and ems was unable to place line due to poor vasculature.  Vitals: Spo2 96 rr16 HR 60-80 Bp 118/80 Cbg 114

## 2024-05-13 NOTE — ED Notes (Signed)
 Pt is otf for xray.

## 2024-05-13 NOTE — ED Notes (Signed)
 CCMD called to place the patient on cardiac monitoring services.

## 2024-05-14 ENCOUNTER — Ambulatory Visit (HOSPITAL_BASED_OUTPATIENT_CLINIC_OR_DEPARTMENT_OTHER): Admitting: Cardiology

## 2024-05-14 NOTE — Progress Notes (Incomplete)
 Cardiology Office Note:  .   Date:  05/14/2024  ID:  [name], DOB May 24, 1956, MRN [id number] PCP: Celestia Rosaline SQUIBB, NP  [facility] Providers Cardiologist:  Shelda Bruckner, MD Electrophysiologist:  OLE ONEIDA HOLTS, MD {  History of Present Illness: .   [name] is a 68 y.o. female with a hx of heart failure with recovered EF due to NICM, chronic right heart failure, persistent atrial fibrillation/flutter with LAA clip, now s/p leadless pacemaker and AV nodal ablation, severe MR/TR s/p MV and TV repair with post op mitral stenosis, prior hypertension now with chronic hypotension, hyperlipidemia, nonobstructive CAD, diabetes mellitus, deep vein thrombosis, COPD, asthma, sleep apnea, obesity, and depression, ESRD on HD who is seen for follow-up.    She has had a long and complex cardiac course. She has frequent readmissions for heart failure, chest pain, and shortness of breath. Please see prior notes for details.  Today: Since our last visit, she underwent Micra leadless pacemaker implantation 03/27/24 and AV nodal ablation on 04/23/24. Amiodarone  was stopped.   Most recent echo 09/2023 shows EF 55-60% visually. RV function mildly reduced/RV size mildly enlarged. Severe biatrial enlargement. Mitral valve with mild MR, mean gradient 8 at 68 bpm. TV with moderate TR despite ring. RAP 3.   Coronary CT 12/2023 showed Ca score 58.9, mild CAD in mid RCA and OM1, minimal CAD in Lcx.  Last RHC 02/22/2022 with normal wedge, mild pulmonary venous hypertension, mildly elevated RA pressure, preserved cardiac output.  She went to ER with chest pain yesterday. Hs Tn reassuring.  ROS:   Studies Reviewed: SABRA    EKG:       Physical Exam:   VS:  There were no vitals taken for this visit.   Wt Readings from Last 3 Encounters:  05/13/24 266 lb (120.7 kg)  04/24/24 289 lb 3.9 oz (131.2 kg)  04/19/24 252 lb (114.3 kg)    GEN: Well nourished, well developed in no  acute distress HEENT: Normal, moist mucous membranes NECK: No JVD CARDIAC: regular rhythm, normal S1 and S2, no rubs or gallops. No murmur. VASCULAR: Radial and DP pulses 2+ bilaterally. No carotid bruits RESPIRATORY:  Clear to auscultation without rales, wheezing or rhonchi  ABDOMEN: Soft, non-tender, non-distended MUSCULOSKELETAL:  Ambulates independently SKIN: Warm and dry, no edema NEUROLOGIC:  Alert and oriented x 3. No focal neuro deficits noted. PSYCHIATRIC:  Normal affect    ASSESSMENT AND PLAN: .    Abdominal pain Nausea/vomiting -has recently seen Dr. Rollin. I cannot see full report as he is in the [facility] system, but she reports attempt at gastric emptying study but she did not tolerate -I would consider stopping GLP1; will reach out to her PCP. Reports some improvement with reglan , so gastric motility may be part of the issue -if we cannot find another etiology, may unfortunately need to consider stopping amiodarone , see below.  atrial fibrillation, persistent Atypical atrial flutter -CHA2DS2/VAS Stroke Risk Points=4, continue DOAC -has tried tikosyn , amiodarone , had MAZE and multiple cardioversions -now s/p leadless pacemaker and AV nodal ablation   S/P MVR and TVR, with post op mitral stenosis chronic systolic and diastolic heart failure, with recovered EF Nonischemic cardiomyopathy Nonobstructive CAD Type II diabetes -following closely with heart failure clinic -NYHA 3-4 -last EF improved to 55-60%, prior 35-40% -now on dialysis -on metoprolol  succinate -no aspirin  when on DOAC -continue statin -would consider stopping GLP as above, may need to uptitrate insulin  with this   History of  obstructive sleep apnea, with CPAP at home: -previously ordered repeat sleep study, not completed   Strong family history of heart disease/heart failure: she is unclear of the etiology of this  CV risk counseling and prevention -recommend heart healthy/Mediterranean diet, with  whole grains, fruits, vegetable, fish, lean meats, nuts, and olive oil. Limit salt. -recommend moderate walking, 3-5 times/week for 30-50 minutes each session. Aim for at least 150 minutes.week. Goal should be pace of 3 miles/hours, or walking 1.5 miles in 30 minutes -recommend avoidance of tobacco products. Avoid excess alcohol .  Dispo:   Signed, Shelda Bruckner, MD   Shelda Bruckner, MD, PhD, [facility] [place]  [facility]  [place]  [facility] at [facility] [address], [address] [place], [place] [address] [phone]

## 2024-05-15 ENCOUNTER — Ambulatory Visit: Payer: Self-pay

## 2024-05-15 DIAGNOSIS — D508 Other iron deficiency anemias: Secondary | ICD-10-CM | POA: Diagnosis not present

## 2024-05-15 DIAGNOSIS — Z992 Dependence on renal dialysis: Secondary | ICD-10-CM | POA: Diagnosis not present

## 2024-05-15 DIAGNOSIS — N2581 Secondary hyperparathyroidism of renal origin: Secondary | ICD-10-CM | POA: Diagnosis not present

## 2024-05-15 DIAGNOSIS — E1129 Type 2 diabetes mellitus with other diabetic kidney complication: Secondary | ICD-10-CM | POA: Diagnosis not present

## 2024-05-15 DIAGNOSIS — N186 End stage renal disease: Secondary | ICD-10-CM | POA: Diagnosis not present

## 2024-05-15 NOTE — Telephone Encounter (Signed)
 FYI Only or Action Required?: FYI only for provider.  Patient was last seen in primary care on 01/21/2024 by Celestia Rosaline SQUIBB, NP.  Called Nurse Triage reporting Fatigue.  Symptoms began several days ago.  Interventions attempted: Rest, hydration, or home remedies.  Symptoms are: gradually worsening.  Triage Disposition: Go to ED Now (or PCP Triage)  Patient/caregiver understands and will follow disposition?: Yes   Copied from CRM #[id number]. Topic: Clinical - Medical Advice >> May 15, 2024 11:18 AM Nathanel BROCKS wrote: Reason for CRM: pt has been sick for about 3 weeks, She went to the er on Tuesday and they ran tests and could not find anything. They also did dialysis there to see if that was the issue and it wasn't. Pt has been in bed since then and has not eatten or drinked anything. Please advise. Reason for Disposition  [1] SEVERE vomiting (e.g., 6 or more times/day) AND [2] present > 8 hours (Exception: Patient sounds well, is drinking liquids, does not sound dehydrated, and vomiting has lasted less than 24 hours.)  Answer Assessment - Initial Assessment Questions Additional info: Patient was in the ER on 05/13/24 for weakness, vomiting. She received dialysis but did not resolve her symptoms, she was discharged on 05/13/24. Since discharge she has been weak and unable to hold down any fluids, she is sucking ice chips but she still vomits.    1. VOMITING SEVERITY: How many times have you vomited in the past 24 hours?       several 2. ONSET: When did the vomiting begin?      Tuesday 3. FLUIDS: What fluids or food have you vomited up today? Have you been able to keep any fluids down?     Unable to hold down anything 4. ABDOMEN PAIN: Are your having any abdomen pain? If Yes : How bad is it and what does it feel like? (e.g., crampy, dull, intermittent, constant)      Mild  5. DIARRHEA: Is there any diarrhea? If Yes, ask: How many times today?      Denies  6. CONTACTS:  Is there anyone else in the family with the same symptoms?      no 7. CAUSE: What do you think is causing your vomiting?     unsure 8. HYDRATION STATUS: Any signs of dehydration? (e.g., dry mouth [not only dry lips], too weak to stand) When did you last urinate?     dehydrated 9. OTHER SYMPTOMS: Do you have any other symptoms? (e.g., fever, headache, vertigo, vomiting blood or coffee grounds, recent head injury)     weakness 10. PREGNANCY: Is there any chance you are pregnant? When was your last menstrual period?  Protocols used: Vomiting-A-AH

## 2024-05-16 ENCOUNTER — Inpatient Hospital Stay (HOSPITAL_COMMUNITY)
Admission: EM | Admit: 2024-05-16 | Discharge: 2024-05-20 | DRG: 073 | Disposition: A | Discharge status: Home Health | Attending: Internal Medicine | Admitting: Internal Medicine

## 2024-05-16 ENCOUNTER — Other Ambulatory Visit: Payer: Self-pay

## 2024-05-16 ENCOUNTER — Encounter (HOSPITAL_COMMUNITY): Payer: Self-pay | Admitting: *Deleted

## 2024-05-16 ENCOUNTER — Emergency Department (HOSPITAL_COMMUNITY)

## 2024-05-16 DIAGNOSIS — J4489 Other specified chronic obstructive pulmonary disease: Secondary | ICD-10-CM | POA: Diagnosis not present

## 2024-05-16 DIAGNOSIS — Z9841 Cataract extraction status, right eye: Secondary | ICD-10-CM

## 2024-05-16 DIAGNOSIS — D631 Anemia in chronic kidney disease: Secondary | ICD-10-CM | POA: Diagnosis present

## 2024-05-16 DIAGNOSIS — I9589 Other hypotension: Secondary | ICD-10-CM | POA: Diagnosis present

## 2024-05-16 DIAGNOSIS — I5032 Chronic diastolic (congestive) heart failure: Secondary | ICD-10-CM | POA: Diagnosis present

## 2024-05-16 DIAGNOSIS — I132 Hypertensive heart and chronic kidney disease with heart failure and with stage 5 chronic kidney disease, or end stage renal disease: Secondary | ICD-10-CM | POA: Diagnosis not present

## 2024-05-16 DIAGNOSIS — Z794 Long term (current) use of insulin: Secondary | ICD-10-CM

## 2024-05-16 DIAGNOSIS — Z90710 Acquired absence of both cervix and uterus: Secondary | ICD-10-CM

## 2024-05-16 DIAGNOSIS — R1084 Generalized abdominal pain: Principal | ICD-10-CM

## 2024-05-16 DIAGNOSIS — Z882 Allergy status to sulfonamides status: Secondary | ICD-10-CM | POA: Diagnosis not present

## 2024-05-16 DIAGNOSIS — Z8701 Personal history of pneumonia (recurrent): Secondary | ICD-10-CM

## 2024-05-16 DIAGNOSIS — S32049D Unspecified fracture of fourth lumbar vertebra, subsequent encounter for fracture with routine healing: Secondary | ICD-10-CM | POA: Diagnosis not present

## 2024-05-16 DIAGNOSIS — Z91030 Bee allergy status: Secondary | ICD-10-CM

## 2024-05-16 DIAGNOSIS — R531 Weakness: Secondary | ICD-10-CM | POA: Diagnosis not present

## 2024-05-16 DIAGNOSIS — N2581 Secondary hyperparathyroidism of renal origin: Secondary | ICD-10-CM | POA: Diagnosis present

## 2024-05-16 DIAGNOSIS — M1990 Unspecified osteoarthritis, unspecified site: Secondary | ICD-10-CM | POA: Diagnosis not present

## 2024-05-16 DIAGNOSIS — I5042 Chronic combined systolic (congestive) and diastolic (congestive) heart failure: Secondary | ICD-10-CM | POA: Diagnosis not present

## 2024-05-16 DIAGNOSIS — Z79899 Other long term (current) drug therapy: Secondary | ICD-10-CM

## 2024-05-16 DIAGNOSIS — Z6841 Body Mass Index (BMI) 40.0 and over, adult: Secondary | ICD-10-CM

## 2024-05-16 DIAGNOSIS — I4891 Unspecified atrial fibrillation: Secondary | ICD-10-CM | POA: Diagnosis not present

## 2024-05-16 DIAGNOSIS — Z7901 Long term (current) use of anticoagulants: Secondary | ICD-10-CM | POA: Diagnosis not present

## 2024-05-16 DIAGNOSIS — Z950 Presence of cardiac pacemaker: Secondary | ICD-10-CM

## 2024-05-16 DIAGNOSIS — Z8249 Family history of ischemic heart disease and other diseases of the circulatory system: Secondary | ICD-10-CM | POA: Diagnosis not present

## 2024-05-16 DIAGNOSIS — I2510 Atherosclerotic heart disease of native coronary artery without angina pectoris: Secondary | ICD-10-CM | POA: Diagnosis not present

## 2024-05-16 DIAGNOSIS — I495 Sick sinus syndrome: Secondary | ICD-10-CM | POA: Diagnosis not present

## 2024-05-16 DIAGNOSIS — E1169 Type 2 diabetes mellitus with other specified complication: Secondary | ICD-10-CM | POA: Diagnosis not present

## 2024-05-16 DIAGNOSIS — I722 Aneurysm of renal artery: Secondary | ICD-10-CM | POA: Diagnosis not present

## 2024-05-16 DIAGNOSIS — Z91041 Radiographic dye allergy status: Secondary | ICD-10-CM

## 2024-05-16 DIAGNOSIS — Z992 Dependence on renal dialysis: Secondary | ICD-10-CM

## 2024-05-16 DIAGNOSIS — E1143 Type 2 diabetes mellitus with diabetic autonomic (poly)neuropathy: Secondary | ICD-10-CM | POA: Diagnosis not present

## 2024-05-16 DIAGNOSIS — M4855XD Collapsed vertebra, not elsewhere classified, thoracolumbar region, subsequent encounter for fracture with routine healing: Secondary | ICD-10-CM | POA: Diagnosis present

## 2024-05-16 DIAGNOSIS — I4819 Other persistent atrial fibrillation: Secondary | ICD-10-CM | POA: Diagnosis not present

## 2024-05-16 DIAGNOSIS — R110 Nausea: Secondary | ICD-10-CM | POA: Diagnosis not present

## 2024-05-16 DIAGNOSIS — N186 End stage renal disease: Secondary | ICD-10-CM | POA: Diagnosis present

## 2024-05-16 DIAGNOSIS — Z87892 Personal history of anaphylaxis: Secondary | ICD-10-CM

## 2024-05-16 DIAGNOSIS — Z883 Allergy status to other anti-infective agents status: Secondary | ICD-10-CM

## 2024-05-16 DIAGNOSIS — Z9842 Cataract extraction status, left eye: Secondary | ICD-10-CM

## 2024-05-16 DIAGNOSIS — I700 Atherosclerosis of aorta: Secondary | ICD-10-CM | POA: Diagnosis present

## 2024-05-16 DIAGNOSIS — R9431 Abnormal electrocardiogram [ECG] [EKG]: Secondary | ICD-10-CM | POA: Diagnosis present

## 2024-05-16 DIAGNOSIS — N250 Renal osteodystrophy: Secondary | ICD-10-CM | POA: Diagnosis present

## 2024-05-16 DIAGNOSIS — Z8616 Personal history of COVID-19: Secondary | ICD-10-CM

## 2024-05-16 DIAGNOSIS — Z881 Allergy status to other antibiotic agents status: Secondary | ICD-10-CM

## 2024-05-16 DIAGNOSIS — E785 Hyperlipidemia, unspecified: Secondary | ICD-10-CM | POA: Diagnosis present

## 2024-05-16 DIAGNOSIS — F32A Depression, unspecified: Secondary | ICD-10-CM | POA: Diagnosis present

## 2024-05-16 DIAGNOSIS — F419 Anxiety disorder, unspecified: Secondary | ICD-10-CM | POA: Diagnosis present

## 2024-05-16 DIAGNOSIS — Z9989 Dependence on other enabling machines and devices: Secondary | ICD-10-CM

## 2024-05-16 DIAGNOSIS — E1122 Type 2 diabetes mellitus with diabetic chronic kidney disease: Secondary | ICD-10-CM | POA: Diagnosis present

## 2024-05-16 DIAGNOSIS — I4892 Unspecified atrial flutter: Secondary | ICD-10-CM | POA: Diagnosis not present

## 2024-05-16 DIAGNOSIS — G4733 Obstructive sleep apnea (adult) (pediatric): Secondary | ICD-10-CM | POA: Diagnosis present

## 2024-05-16 DIAGNOSIS — Z803 Family history of malignant neoplasm of breast: Secondary | ICD-10-CM

## 2024-05-16 DIAGNOSIS — K3184 Gastroparesis: Secondary | ICD-10-CM | POA: Diagnosis present

## 2024-05-16 DIAGNOSIS — R112 Nausea with vomiting, unspecified: Secondary | ICD-10-CM | POA: Diagnosis not present

## 2024-05-16 DIAGNOSIS — Z9181 History of falling: Secondary | ICD-10-CM | POA: Diagnosis not present

## 2024-05-16 DIAGNOSIS — Z86718 Personal history of other venous thrombosis and embolism: Secondary | ICD-10-CM

## 2024-05-16 DIAGNOSIS — R509 Fever, unspecified: Secondary | ICD-10-CM | POA: Diagnosis not present

## 2024-05-16 DIAGNOSIS — K219 Gastro-esophageal reflux disease without esophagitis: Secondary | ICD-10-CM | POA: Diagnosis present

## 2024-05-16 DIAGNOSIS — I4821 Permanent atrial fibrillation: Secondary | ICD-10-CM | POA: Diagnosis present

## 2024-05-16 DIAGNOSIS — J449 Chronic obstructive pulmonary disease, unspecified: Secondary | ICD-10-CM | POA: Diagnosis present

## 2024-05-16 DIAGNOSIS — S32059D Unspecified fracture of fifth lumbar vertebra, subsequent encounter for fracture with routine healing: Secondary | ICD-10-CM | POA: Diagnosis not present

## 2024-05-16 LAB — COMPREHENSIVE METABOLIC PANEL WITH GFR
ALT: 14 U/L (ref 0–44)
AST: 22 U/L (ref 15–41)
Albumin: 3.9 g/dL (ref 3.5–5.0)
Alkaline Phosphatase: 100 U/L (ref 38–126)
Anion gap: 15 (ref 5–15)
BUN: 22 mg/dL (ref 8–23)
CO2: 27 mmol/L (ref 22–32)
Calcium: 10 mg/dL (ref 8.9–10.3)
Chloride: 93 mmol/L — ABNORMAL LOW (ref 98–111)
Creatinine, Ser: 8.14 mg/dL — ABNORMAL HIGH (ref 0.44–1.00)
GFR, Estimated: 5 mL/min — ABNORMAL LOW (ref >60)
Glucose, Bld: 87 mg/dL (ref 70–99)
Potassium: 4.3 mmol/L (ref 3.5–5.1)
Sodium: 135 mmol/L (ref 135–145)
Total Bilirubin: 1.1 mg/dL (ref 0.0–1.2)
Total Protein: 8.4 g/dL — ABNORMAL HIGH (ref 6.5–8.1)

## 2024-05-16 LAB — CBC
HCT: 39.4 % (ref 36.0–46.0)
Hemoglobin: 12.7 g/dL (ref 12.0–15.0)
MCH: 30.7 pg (ref 26.0–34.0)
MCHC: 32.2 g/dL (ref 30.0–36.0)
MCV: 95.2 fL (ref 80.0–100.0)
Platelets: 163 K/uL (ref 150–400)
RBC: 4.14 MIL/uL (ref 3.87–5.11)
RDW: 13.9 % (ref 11.5–15.5)
WBC: 5 K/uL (ref 4.0–10.5)
nRBC: 0 % (ref 0.0–0.2)

## 2024-05-16 LAB — TROPONIN I (HIGH SENSITIVITY)
Troponin I (High Sensitivity): 19 ng/L — ABNORMAL HIGH (ref <18)
Troponin I (High Sensitivity): 15 ng/L (ref <18)

## 2024-05-16 LAB — LIPASE, BLOOD: Lipase: 14 U/L (ref 11–51)

## 2024-05-16 MED ORDER — ROSUVASTATIN CALCIUM 5 MG PO TABS
10.0000 mg | ORAL_TABLET | Freq: Every day | ORAL | Status: DC
  Administered 2024-05-17 – 2024-05-19 (×3): 10 mg via ORAL
  Filled 2024-05-16 (×4): qty 2

## 2024-05-16 MED ORDER — ONDANSETRON HCL 4 MG/2ML IJ SOLN
4.0000 mg | Freq: Once | INTRAMUSCULAR | Status: AC
  Administered 2024-05-16: 4 mg via INTRAVENOUS
  Filled 2024-05-16: qty 2

## 2024-05-16 MED ORDER — ACETAMINOPHEN 325 MG PO TABS
650.0000 mg | ORAL_TABLET | Freq: Four times a day (QID) | ORAL | Status: DC | PRN
  Administered 2024-05-17 – 2024-05-18 (×3): 650 mg via ORAL
  Filled 2024-05-16 (×3): qty 2

## 2024-05-16 MED ORDER — HYDROMORPHONE HCL 1 MG/ML IJ SOLN
1.0000 mg | Freq: Once | INTRAMUSCULAR | Status: AC
  Administered 2024-05-16: 1 mg via INTRAVENOUS
  Filled 2024-05-16: qty 1

## 2024-05-16 MED ORDER — SENNOSIDES-DOCUSATE SODIUM 8.6-50 MG PO TABS: 1.0000 | ORAL_TABLET | Freq: Every evening | ORAL | Status: DC | PRN

## 2024-05-16 MED ORDER — DULOXETINE HCL 60 MG PO CPEP
60.0000 mg | ORAL_CAPSULE | Freq: Every day | ORAL | Status: DC
  Administered 2024-05-17 – 2024-05-20 (×4): 60 mg via ORAL
  Filled 2024-05-16 (×4): qty 1

## 2024-05-16 MED ORDER — BUSPIRONE HCL 5 MG PO TABS
5.0000 mg | ORAL_TABLET | Freq: Two times a day (BID) | ORAL | Status: DC
  Administered 2024-05-17 – 2024-05-20 (×7): 5 mg via ORAL
  Filled 2024-05-16 (×7): qty 1

## 2024-05-16 MED ORDER — SODIUM CHLORIDE 0.9% FLUSH
3.0000 mL | Freq: Two times a day (BID) | INTRAVENOUS | Status: DC
  Administered 2024-05-17 – 2024-05-20 (×8): 3 mL via INTRAVENOUS

## 2024-05-16 MED ORDER — ALBUTEROL SULFATE (2.5 MG/3ML) 0.083% IN NEBU: 2.5000 mg | INHALATION_SOLUTION | Freq: Four times a day (QID) | RESPIRATORY_TRACT | Status: DC | PRN

## 2024-05-16 MED ORDER — TRIMETHOBENZAMIDE HCL 100 MG/ML IM SOLN
200.0000 mg | Freq: Four times a day (QID) | INTRAMUSCULAR | Status: DC | PRN
  Administered 2024-05-17 – 2024-05-19 (×4): 200 mg via INTRAMUSCULAR
  Filled 2024-05-16 (×7): qty 2

## 2024-05-16 MED ORDER — PANTOPRAZOLE SODIUM 40 MG IV SOLR
40.0000 mg | Freq: Two times a day (BID) | INTRAVENOUS | Status: DC
Start: 2024-05-16 — End: 2024-05-20
  Administered 2024-05-17 – 2024-05-20 (×8): 40 mg via INTRAVENOUS
  Filled 2024-05-16 (×8): qty 10

## 2024-05-16 MED ORDER — APIXABAN 5 MG PO TABS
5.0000 mg | ORAL_TABLET | Freq: Two times a day (BID) | ORAL | Status: DC
  Administered 2024-05-17 – 2024-05-20 (×7): 5 mg via ORAL
  Filled 2024-05-16 (×7): qty 1

## 2024-05-16 MED ORDER — ACETAMINOPHEN 650 MG RE SUPP: 650.0000 mg | Freq: Four times a day (QID) | RECTAL | Status: DC | PRN

## 2024-05-16 MED ORDER — MIDODRINE HCL 5 MG PO TABS
10.0000 mg | ORAL_TABLET | Freq: Every day | ORAL | Status: DC
  Administered 2024-05-17 – 2024-05-20 (×4): 10 mg via ORAL
  Filled 2024-05-16 (×4): qty 2

## 2024-05-16 MED ORDER — LORAZEPAM 2 MG/ML IJ SOLN
1.0000 mg | Freq: Once | INTRAMUSCULAR | Status: AC
  Administered 2024-05-16: 1 mg via INTRAVENOUS
  Filled 2024-05-16: qty 1

## 2024-05-16 NOTE — Telephone Encounter (Signed)
 Disposition noted for patient to report to the ED.  PCP office is closed today.  No appointment availability.

## 2024-05-16 NOTE — ED Notes (Addendum)
 Pt provided water for PO challenge

## 2024-05-16 NOTE — ED Provider Notes (Signed)
 [place] EMERGENCY DEPARTMENT AT [facility] Provider Note   CSN: [id number] Arrival date & time: 05/16/24  1317     Patient presents with: No chief complaint on file.   [name] is a 68 y.o. female with past medical history significant for diabetes, CHF, COPD, anxiety, depression, hypertension, gastroparesis, ESRD on dialysis who presents concern for nausea, vomiting, abdominal pain intermittently for 1 week.  She reports that she has not missed any dialysis sessions.  She was given some Phenergan  yesterday and was able to complete dialysis.  She denies any diarrhea.  She does feel like she has had some fever or chills.  She endorses some decreased appetite.  She does not make any urine.  She denies any blood in her vomit.   HPI     Prior to Admission medications   Medication Sig Start Date End Date Taking? Authorizing Provider  acetaminophen  (TYLENOL ) 325 MG tablet Take 2 tablets (650 mg total) by mouth every 6 (six) hours as needed for mild pain or moderate pain. Patient taking differently: Take 650 mg by mouth daily as needed for mild pain (pain score 1-3) or moderate pain (pain score 4-6). 12/07/21   Arrien, Mauricio Daniel, MD  albuterol  (PROVENTIL  HFA) 108 [phone] Base) MCG/ACT inhaler Inhale 1-2 puffs into the lungs every 6 (six) hours as needed for wheezing or shortness of breath. 10/13/22   Celestia Rosaline SQUIBB, NP  AURYXIA  1 GM 210 MG(Fe) tablet Take 210 mg by mouth 3 (three) times daily.    [provider]  busPIRone  (BUSPAR ) 5 MG tablet TAKE ONE TABLET BY MOUTH TWICE DAILY @9AM -5PM Patient taking differently: Take 5 mg by mouth 2 (two) times daily. 02/14/24   Celestia Rosaline SQUIBB, NP  diphenhydrAMINE  (BENADRYL ) 25 MG tablet Take 25 mg by mouth every 6 (six) hours as needed for itching. Patient taking differently: Take 25 mg by mouth as needed for itching. 03/31/22   [provider]  doxercalciferol  (HECTOROL ) 4 MCG/2ML injection Inject 1.5  mLs (3 mcg total) into the vein Every Tuesday,Thursday,and Saturday with dialysis. 03/06/24   Amin, Ankit C, MD  DULoxetine  (CYMBALTA ) 60 MG capsule TAKE ONE CAPSULE (60MG  TOTAL) BY MOUTH DAILY AT 9AM 02/29/24   Celestia Rosaline SQUIBB, NP  ELIQUIS  5 MG TABS tablet TAKE ONE TABLET (5MG  TOTAL) BY MOUTH TWICE DAILY @9AM -5PM 09/06/23   Bensimhon, Toribio SAUNDERS, MD  EPINEPHrine  0.3 mg/0.3 mL IJ SOAJ injection Inject 0.3 mg into the muscle as needed for anaphylaxis. 06/25/20   Celestia Rosaline SQUIBB, NP  fluticasone  (FLONASE  ALLERGY RELIEF) 50 MCG/ACT nasal spray Place 2 sprays into both nostrils daily as needed for allergies. Patient taking differently: Place 2 sprays into both nostrils as needed for allergies. 03/31/22   [provider]  gabapentin  (NEURONTIN ) 100 MG capsule Take 1 capsule (100 mg total) by mouth every Tuesday, Thursday, and Saturday at 6 PM. 04/26/24 05/26/24  Cheryle Page, MD  insulin  lispro (HUMALOG  KWIKPEN) 100 UNIT/ML KwikPen For blood sugars 0-150 give 0 units of insulin , 151-200 give 2 units of insulin , 201-250 give 4 units, 251-300 give 6 units, 301-350 give 8 units, 351-400 give 10 units,> 400 give 12 units and call M.D. Patient taking differently: Inject 1-2 Units into the skin 3 (three) times daily. 09/27/23   Newlin, Enobong, MD  lidocaine -prilocaine  (EMLA ) cream Apply 1 Application topically Every Tuesday,Thursday,and Saturday with dialysis. 06/01/22   [provider]  loratadine  (CLARITIN ) 10 MG tablet TAKE ONE TABLET (10 mg total) BY  MOUTH DAILY AT 9AM EVERY MORNING. 04/01/24   Celestia Rosaline SQUIBB, NP  Methoxy PEG-Epoetin  Beta (MIRCERA IJ) 50 mcg. 01/29/24 01/27/25  [provider]  metoCLOPramide  (REGLAN ) 5 MG tablet Take 1 tablet (5 mg total) by mouth every 8 (eight) hours as needed for nausea. 07/10/23   Celestia Rosaline SQUIBB, NP  midodrine  (PROAMATINE ) 10 MG tablet Take 1 tablet (10 mg total) by mouth 2 (two) times daily. 04/25/24   Cheryle Page, MD  Multiple  Vitamins-Minerals (MULTIVITAMIN WOMEN PO) Take 1 tablet by mouth daily.    [provider]  nitroGLYCERIN  (NITROSTAT ) 0.4 MG SL tablet Place 1 tablet (0.4 mg total) under the tongue every 5 (five) minutes x 3 doses as needed for chest pain. 11/08/23   Lonni Slain, MD  oxyCODONE -acetaminophen  (PERCOCET/ROXICET) 5-325 MG tablet Take 1 tablet by mouth every 8 (eight) hours as needed for severe pain (pain score 7-10). 04/25/24   Cheryle Page, MD  pantoprazole  (PROTONIX ) 40 MG tablet TAKE ONE TABLET BY MOUTH DAILY AT 9AM 04/25/24   Celestia Rosaline SQUIBB, NP  polyethylene glycol powder (GLYCOLAX /MIRALAX ) 17 GM/SCOOP powder Dissolve 1 capful (17g) in 4-8 ounces of liquid and take by mouth daily as needed 04/25/24   Cheryle Page, MD  rosuvastatin  (CRESTOR ) 10 MG tablet TAKE ONE TABLET BY MOUTH DAILY AT 5PM 04/04/24   Bensimhon, Daniel R, MD  senna (SENOKOT) 8.6 MG TABS tablet Take 1 tablet (8.6 mg total) by mouth 2 (two) times daily. 04/25/24   Cheryle Page, MD    Allergies: Bee pollen, Bee venom, Cefuroxime, Sulfa antibiotics, Chlorhexidine , and Iodinated contrast media    Review of Systems  All other systems reviewed and are negative.   Updated Vital Signs BP 120/70 (BP Location: Right Arm)   Pulse 68   Temp (!) 97.5 F (36.4 C) (Oral)   Resp 18   Ht 5' 7 (1.702 m)   Wt 120.7 kg   SpO2 96%   BMI 41.66 kg/m   Physical Exam Vitals and nursing note reviewed.  Constitutional:      General: She is not in acute distress.    Appearance: Normal appearance.  HENT:     Head: Normocephalic and atraumatic.  Eyes:     General:        Right eye: No discharge.        Left eye: No discharge.  Cardiovascular:     Rate and Rhythm: Normal rate and regular rhythm.     Heart sounds: No murmur heard.    No friction rub. No gallop.  Pulmonary:     Effort: Pulmonary effort is normal.     Breath sounds: Normal breath sounds.  Abdominal:     General: Bowel sounds are normal.      Palpations: Abdomen is soft.     Comments: Diffusely tender throughout the abdomen, most focally in the left upper quadrant, left lower quadrant, no rebound, rigidity, guarding.  Normal bowel sounds throughout.  Skin:    General: Skin is warm and dry.     Capillary Refill: Capillary refill takes less than 2 seconds.  Neurological:     Mental Status: She is alert and oriented to person, place, and time.  Psychiatric:        Mood and Affect: Mood normal.        Behavior: Behavior normal.     (all labs ordered are listed, but only abnormal results are displayed) Labs Reviewed  LIPASE, BLOOD  COMPREHENSIVE METABOLIC PANEL WITH GFR  CBC  EKG: None  Radiology: CT ABDOMEN PELVIS WO CONTRAST Result Date: 05/16/2024 CLINICAL DATA:  Abdominal pain. EXAM: CT ABDOMEN AND PELVIS WITHOUT CONTRAST TECHNIQUE: Multidetector CT imaging of the abdomen and pelvis was performed following the standard protocol without IV contrast. RADIATION DOSE REDUCTION: This exam was performed according to the departmental dose-optimization program which includes automated exposure control, adjustment of the mA and/or kV according to patient size and/or use of iterative reconstruction technique. COMPARISON:  04/22/2024 and 04/19/2024 FINDINGS: Lower chest: Mild stable cardiomegaly. Median sternotomy. Postsurgical changes over the heart including metallic density unchanged over the ventricular septum as well as evidence of previous tricuspid and mitral valve repairs. Lung bases demonstrate mild linear scarring over the left base and are otherwise clear. Hepatobiliary: Previous cholecystectomy. The the liver and biliary tree otherwise unremarkable. The Pancreas: Normal. Spleen: Normal. Adrenals/Urinary Tract: Adrenal glands are normal. The kidneys are normal in size without hydronephrosis or nephrolithiasis. Ureters are normal. Bladder is nondistended. Stomach/Bowel: Possible small sliding hiatal hernia. Stomach is otherwise  unremarkable. Small bowel is normal. Appendix is normal. Colon is normal. Vascular/Lymphatic: Mild calcified plaque over the abdominal aorta which is normal in caliber. Two adjacent calcified stable left renal artery aneurysms the adjacent the renal hilum. The small nodes over the gastrohepatic ligament and porta hepatis unchanged. Reproductive: Uterus and bilateral adnexa are unremarkable. Other: No free fluid or focal inflammatory change. Musculoskeletal: No change in moderate T11 compression fracture. Mild compression deformities of L3-L5 unchanged. IMPRESSION: 1. No acute findings in the abdomen/pelvis. 2. Stable calcified left renal artery aneurysms. 3. Stable mild cardiomegaly. 4. Aortic atherosclerosis. 5. Stable compression fractures of the thoracolumbar spine. Aortic Atherosclerosis (ICD10-I70.0). Electronically Signed   By: Toribio Agreste M.D.   On: 05/16/2024 14:58     Procedures   Medications Ordered in the ED  HYDROmorphone  (DILAUDID ) injection 1 mg (has no administration in time range)  ondansetron  (ZOFRAN ) injection 4 mg (has no administration in time range)                                    Medical Decision Making Amount and/or Complexity of Data Reviewed Labs: ordered. Radiology: ordered.  Risk Prescription drug management.   This patient is a 68 y.o. female  who presents to the ED for concern of nausea, vomiting, abdominal pain.   Differential diagnoses prior to evaluation: The emergent differential diagnosis includes, but is not limited to,  The causes of generalized abdominal pain include but are not limited to AAA, mesenteric ischemia, appendicitis, diverticulitis, DKA, gastritis, gastroenteritis, AMI, nephrolithiasis, pancreatitis, peritonitis, adrenal insufficiency,lead poisoning, iron  toxicity, intestinal ischemia, constipation, UTI,SBO/LBO, splenic rupture, biliary disease, IBD, IBS, PUD, or hepatitis. This is not an exhaustive differential.   Past Medical History  / Co-morbidities / Social History: diabetes, CHF, COPD, anxiety, depression, hypertension, gastroparesis, ESRD on dialysis  Additional history: Chart reviewed. Pertinent results include: Reviewed lab work, imaging from previous emergency department visits, seen somewhat frequently for similar abdominal pain  Physical Exam: Physical exam performed. The pertinent findings include: Vital signs stable in the ED, Diffusely tender throughout the abdomen, most focally in the left upper quadrant, left lower quadrant, no rebound, rigidity, guarding.  Normal bowel sounds throughout.   Lab Tests/Imaging studies: Lab work pending at time of handoff. CTAP without contrast with no acute intraabdominal abnormality, stable compared to recent baseline.   Cardiac monitoring: EKG obtained and interpreted by myself and attending physician which  shows: A flutter, patient has chronic A-fib/flutter   Medications: I ordered medication including Dilaudid  for pain, Zofran  for nausea.  I have reviewed the patients home medicines and have made adjustments as needed.  3:04 PM Care of [name] transferred to PA Tinnie Matter and Dr. Lenor at the end of my shift as the patient will require reassessment once labs/imaging have resulted. Patient presentation, ED course, and plan of care discussed with review of all pertinent labs and imaging. Please see his/her note for further details regarding further ED course and disposition. Plan at time of handoff is reassess after lab work, imaging, if abdominal pain improved, can tolerate p.o., no surgical abnormality, likely stable for discharge with close GI follow-up. This may be altered or completely changed at the discretion of the oncoming team pending results of further workup.   Final diagnoses:  None    ED Discharge Orders     None          Rosan Sherlean DEL, PA-C 05/16/24 1504    Neysa Caron PARAS, [place] 05/16/24 1535

## 2024-05-16 NOTE — Hospital Course (Signed)
 [name] is a 68 y.o. 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

## 2024-05-16 NOTE — H&P (Signed)
 History and Physical    [name] FMW:[id number] DOB: [birth year] DOA: 05/16/2024  PCP: Celestia Rosaline SQUIBB, NP  Patient coming from: Home  I have personally briefly reviewed patient's old medical records in [facility]  Chief Complaint: Nausea and vomiting  HPI: [name] is a 68 y.o. female with medical history significant for ESRD on TTS HD, permanent A-fib/flutter on Eliquis  s/p maze procedure/prior DCCVs and AV nodal ablation 04/23/2024, chronic HFpEF, s/p leadless PPM, s/p mitral and tricuspid valve repair, COPD, chronic hypotension on midodrine , T2DM, HLD, history of DVT, depression/anxiety, gastroparesis, OSA who presented to the ED for evaluation of abdominal pain with nausea and vomiting.  Patient reports 1 week of intractable nausea and vomiting with generalized abdominal discomfort.  She says she has not been able to maintain any adequate oral intake.  She has not seen any blood in her emesis.  She denies any recent diarrhea.  She states that she no longer makes urine.  She reports adherence to dialysis with last HD yesterday as per usual schedule.  She has not seen any increased swelling in her extremities.  ED Course  Labs/Imaging on admission: I have personally reviewed following labs and imaging studies.  Initial vitals showed BP 120/70, pulse of 56, RR 18, temp 97.5 F, SpO2 96% on room air.  Labs showed sodium 135, potassium 4.3, bicarb 27, BUN 22, creatinine 8.14, serum glucose 87, LFTs within normal limits, WBC 5.0, hemoglobin 12.7, platelets 163, lipase 14, troponin 19 > 15.  CT abdomen/pelvis without contrast negative for acute findings.  Stable calcified left renal artery aneurysms, stable mild cardiomegaly, aortic atherosclerosis, and stable compression fractures of the thoracolumbar spine noted.  Patient was given IV Dilaudid  1 mg x 3, IV Ativan  1 mg, IV Zofran  x 2.  Patient failed p.o. challenge x 2.  The hospitalist service was consulted  for admission.  Review of Systems: All systems reviewed and are negative except as documented in history of present illness above.   Past Medical History:  Diagnosis Date   Acute on chronic systolic CHF (congestive heart failure) (HCC) 12/21/2020   Allergic rhinitis    Anemia    Arthritis    Asthma    Atrial fibrillation with RVR (HCC) 11/09/2023   Chronic diastolic CHF (congestive heart failure) (HCC)    Chronic kidney disease    Dialysis Tu/TH/Sa   COPD (chronic obstructive pulmonary disease) (HCC)    COVID-19 03/28/2023   Depression    DM (diabetes mellitus) (HCC)    DVT (deep vein thrombosis) in pregnancy    Gastroenteritis 03/27/2023   GERD (gastroesophageal reflux disease)    HCAP (healthcare-associated pneumonia) 06/21/2022   HTN (hypertension)    Hyperlipidemia    Obesity    Pneumonia 04/2018   RIGHT LOBE   Sleep apnea    needs to be retested - to get new cpap    Past Surgical History:  Procedure Laterality Date   ABDOMINAL HYSTERECTOMY     AV FISTULA PLACEMENT Left 03/17/2022   Procedure: LEFT ARM ARTERIOVENOUS (AV) FISTULA CREATION;  Surgeon: Eliza Lonni RAMAN, MD;  Location: [facility] OR;  Service: Vascular;  Laterality: Left;   AV NODE ABLATION N/A 04/23/2024   Procedure: AV NODE ABLATION;  Surgeon: Waddell Danelle ORN, MD;  Location: [facility] INVASIVE CV LAB;  Service: Cardiovascular;  Laterality: N/A;   BIOPSY  01/12/2023   Procedure: BIOPSY;  Surgeon: Rollin Dover, MD;  Location: [facility] ENDOSCOPY;  Service: Gastroenterology;;   BUBBLE STUDY  11/26/2020   Procedure: BUBBLE STUDY;  Surgeon: Lonni Slain, MD;  Location: [facility] ENDOSCOPY;  Service: Cardiovascular;;   CARDIOVERSION N/A 05/06/2020   Procedure: CARDIOVERSION;  Surgeon: Lonni Slain, MD;  Location: [facility] ENDOSCOPY;  Service: Cardiovascular;  Laterality: N/A;   CARDIOVERSION N/A 11/04/2020   Procedure: CARDIOVERSION;  Surgeon: Pietro Redell RAMAN, MD;  Location: [facility] ENDOSCOPY;  Service: Cardiovascular;   Laterality: N/A;   CARDIOVERSION N/A 02/01/2021   Procedure: CARDIOVERSION;  Surgeon: Cherrie Toribio SAUNDERS, MD;  Location: [facility] ENDOSCOPY;  Service: Cardiovascular;  Laterality: N/A;   CARDIOVERSION N/A 02/09/2021   Procedure: CARDIOVERSION;  Surgeon: Cherrie Toribio SAUNDERS, MD;  Location: [facility] ENDOSCOPY;  Service: Cardiovascular;  Laterality: N/A;   CARDIOVERSION N/A 04/19/2021   Procedure: CARDIOVERSION;  Surgeon: Okey Vina GAILS, MD;  Location: [facility] ENDOSCOPY;  Service: Cardiovascular;  Laterality: N/A;   CARDIOVERSION N/A 11/25/2021   Procedure: CARDIOVERSION;  Surgeon: Cherrie Toribio SAUNDERS, MD;  Location: [facility] ENDOSCOPY;  Service: Cardiovascular;  Laterality: N/A;   CARDIOVERSION N/A 01/31/2022   Procedure: CARDIOVERSION;  Surgeon: Cherrie Toribio SAUNDERS, MD;  Location: [facility] ENDOSCOPY;  Service: Cardiovascular;  Laterality: N/A;   CARDIOVERSION N/A 07/23/2023   Procedure: CARDIOVERSION (CATH LAB);  Surgeon: Francyne Headland, MD;  Location: [facility] INVASIVE CV LAB;  Service: Cardiovascular;  Laterality: N/A;   CARDIOVERSION N/A 12/05/2023   Procedure: CARDIOVERSION;  Surgeon: Cherrie Toribio SAUNDERS, MD;  Location: [facility] INVASIVE CV LAB;  Service: Cardiovascular;  Laterality: N/A;   CATARACT EXTRACTION, BILATERAL     CHOLECYSTECTOMY     COLONOSCOPY WITH PROPOFOL  N/A 03/05/2015   Procedure: COLONOSCOPY WITH PROPOFOL ;  Surgeon: Belvie Just, MD;  Location: [facility] ENDOSCOPY;  Service: Endoscopy;  Laterality: N/A;   DIAGNOSTIC LAPAROSCOPY     ESOPHAGOGASTRODUODENOSCOPY (EGD) WITH PROPOFOL  N/A 01/12/2023   Procedure: ESOPHAGOGASTRODUODENOSCOPY (EGD) WITH PROPOFOL ;  Surgeon: Just Belvie, MD;  Location: [facility] ENDOSCOPY;  Service: Gastroenterology;  Laterality: N/A;   FISTULA SUPERFICIALIZATION Left 03/17/2022   Procedure: FISTULA SUPERFICIALIZATION -LEFT;  Surgeon: Eliza Lonni RAMAN, MD;  Location: [facility] OR;  Service: Vascular;  Laterality: Left;   ingrown hallux Left    IR FLUORO GUIDE CV LINE RIGHT  03/14/2022   IR US  GUIDE VASC ACCESS  RIGHT  03/14/2022   KNEE SURGERY     LEFT HEART CATH AND CORONARY ANGIOGRAPHY N/A 12/31/2017   Procedure: LEFT HEART CATH AND CORONARY ANGIOGRAPHY;  Surgeon: Levern Hutching, MD;  Location: [facility] INVAS[facility] CV LAB;  Service: Cardiovascular;  Laterality: N/A;   MAZE N/A 12/29/2020   Procedure: MAZE;  Surgeon: Kerrin Elspeth BROCKS, MD;  Location: [facility] OR;  Service: Open Heart Surgery;  Laterality: N/A;   MITRAL VALVE REPAIR N/A 12/29/2020   Procedure: MITRAL VALVE REPAIR USING CARBOMEDICS ANNULOFLEX RING SIZE 30;  Surgeon: Kerrin Elspeth BROCKS, MD;  Location: [facility] OR;  Service: Open Heart Surgery;  Laterality: N/A;   PACEMAKER LEADLESS INSERTION N/A 03/27/2024   Procedure: PACEMAKER LEADLESS INSERTION;  Surgeon: Kennyth Chew, MD;  Location: [facility] INVASIVE CV LAB;  Service: Cardiovascular;  Laterality: N/A;   REVISON OF ARTERIOVENOUS FISTULA Left 01/05/2023   Procedure: REVISION OF LEFT ARTERIOVENOUS FISTULA WITH INTERPOSITION PTFE GRAFT;  Surgeon: Eliza Lonni RAMAN, MD;  Location: [facility] OR;  Service: Vascular;  Laterality: Left;  ELIQUIS  AND HOLD 48 X HOURS   RIGHT HEART CATH N/A 12/22/2020   Procedure: RIGHT HEART CATH;  Surgeon: Rolan Ezra RAMAN, MD;  Location: [facility] INVASIVE CV LAB;  Service: Cardiovascular;  Laterality: N/A;   RIGHT HEART CATH N/A 01/31/2021   Procedure: RIGHT  HEART CATH;  Surgeon: Cherrie Toribio SAUNDERS, MD;  Location: [facility] INVASIVE CV LAB;  Service: Cardiovascular;  Laterality: N/A;   RIGHT HEART CATH N/A 07/29/2021   Procedure: RIGHT HEART CATH;  Surgeon: Cherrie Toribio SAUNDERS, MD;  Location: [facility] INVASIVE CV LAB;  Service: Cardiovascular;  Laterality: N/A;   RIGHT HEART CATH N/A 11/24/2021   Procedure: RIGHT HEART CATH;  Surgeon: Cherrie Toribio SAUNDERS, MD;  Location: [facility] INVASIVE CV LAB;  Service: Cardiovascular;  Laterality: N/A;   RIGHT HEART CATH N/A 02/14/2022   Procedure: RIGHT HEART CATH;  Surgeon: Cherrie Toribio SAUNDERS, MD;  Location: [facility] INVASIVE CV LAB;  Service: Cardiovascular;  Laterality: N/A;    RIGHT HEART CATH N/A 02/22/2022   Procedure: RIGHT HEART CATH;  Surgeon: Rolan Ezra RAMAN, MD;  Location: [facility] INVASIVE CV LAB;  Service: Cardiovascular;  Laterality: N/A;   TEE WITHOUT CARDIOVERSION N/A 11/26/2020   Procedure: TRANSESOPHAGEAL ECHOCARDIOGRAM (TEE);  Surgeon: Lonni Slain, MD;  Location: [facility] ENDOSCOPY;  Service: Cardiovascular;  Laterality: N/A;   TEE WITHOUT CARDIOVERSION N/A 12/29/2020   Procedure: TRANSESOPHAGEAL ECHOCARDIOGRAM (TEE);  Surgeon: Kerrin Elspeth BROCKS, MD;  Location: [facility] OR;  Service: Open Heart Surgery;  Laterality: N/A;   TEE WITHOUT CARDIOVERSION N/A 01/20/2022   Procedure: TRANSESOPHAGEAL ECHOCARDIOGRAM (TEE);  Surgeon: Cherrie Toribio SAUNDERS, MD;  Location: [facility] ENDOSCOPY;  Service: Cardiovascular;  Laterality: N/A;   TRICUSPID VALVE REPLACEMENT N/A 12/29/2020   Procedure: 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 RING SIZE 34;  Surgeon: Kerrin Elspeth BROCKS, MD;  Location: [facility] OR;  Service: Open Heart Surgery;  Laterality: N/A;    Social History: Social History   Tobacco Use   Smoking status: Never    Passive exposure: Never   Smokeless tobacco: Never   Tobacco comments:    Never smoke 10/20/22  Vaping Use   Vaping status: Never Used  Substance Use Topics   Alcohol  use: No   Drug use: No   Allergies  Allergen Reactions   Bee Venom Anaphylaxis   Cefuroxime Itching and Nausea And Vomiting   Sulfa Antibiotics Itching and Rash   Chlorhexidine      Unknown rxn.   Iodinated Contrast Media Nausea And Vomiting    Family History  Problem Relation Age of Onset   Breast cancer Mother    Cancer Mother    Hypertension Mother    Cancer Father    Hypertension Sister    Hypertension Brother    CAD Other      Prior to Admission medications   Medication Sig Start Date End Date Taking? Authorizing Provider  acetaminophen  (TYLENOL ) 325 MG tablet Take 2 tablets (650 mg total) by mouth every 6 (six) hours as needed for mild pain or moderate  pain. Patient taking differently: Take 650 mg by mouth daily as needed for mild pain (pain score 1-3) or moderate pain (pain score 4-6). 12/07/21   Arrien, Elidia Toribio, MD  albuterol  (PROVENTIL  HFA) [id number] Base) MCG/ACT inhaler Inhale 1-2 puffs into the lungs every 6 (six) hours as needed for wheezing or shortness of breath. 10/13/22   Celestia Rosaline SQUIBB, NP  AURYXIA  1 GM 210 MG(Fe) tablet Take 210 mg by mouth 3 (three) times daily.    [provider]  busPIRone  (BUSPAR ) 5 MG tablet TAKE ONE TABLET BY MOUTH TWICE DAILY @9AM -5PM Patient taking differently: Take 5 mg by mouth 2 (two) times daily. 02/14/24   Celestia Rosaline SQUIBB, NP  diphenhydrAMINE  (BENADRYL ) 25 MG tablet Take 25 mg by mouth every 6 (six)  hours as needed for itching. Patient taking differently: Take 25 mg by mouth as needed for itching. 03/31/22   [provider]  doxercalciferol  (HECTOROL ) 4 MCG/2ML injection Inject 1.5 mLs (3 mcg total) into the vein Every Tuesday,Thursday,and Saturday with dialysis. 03/06/24   Amin, Ankit C, MD  DULoxetine  (CYMBALTA ) 60 MG capsule TAKE ONE CAPSULE (60MG  TOTAL) BY MOUTH DAILY AT 9AM 02/29/24   Celestia Rosaline SQUIBB, NP  ELIQUIS  5 MG TABS tablet TAKE ONE TABLET (5MG  TOTAL) BY MOUTH TWICE DAILY @9AM -5PM 09/06/23   Bensimhon, Toribio SAUNDERS, MD  EPINEPHrine  0.3 mg/0.3 mL IJ SOAJ injection Inject 0.3 mg into the muscle as needed for anaphylaxis. 06/25/20   Celestia Rosaline SQUIBB, NP  fluticasone  (FLONASE  ALLERGY RELIEF) 50 MCG/ACT nasal spray Place 2 sprays into both nostrils daily as needed for allergies. Patient taking differently: Place 2 sprays into both nostrils as needed for allergies. 03/31/22   [provider]  gabapentin  (NEURONTIN ) 100 MG capsule Take 1 capsule (100 mg total) by mouth every Tuesday, Thursday, and Saturday at 6 PM. 04/26/24 05/26/24  Cheryle Page, MD  insulin  lispro (HUMALOG  KWIKPEN) 100 UNIT/ML KwikPen For blood sugars 0-150 give 0 units of insulin , 151-200 give 2  units of insulin , 201-250 give 4 units, 251-300 give 6 units, 301-350 give 8 units, 351-400 give 10 units,> 400 give 12 units and call M.D. Patient taking differently: Inject 1-2 Units into the skin 3 (three) times daily. 09/27/23   Newlin, Enobong, MD  lidocaine -prilocaine  (EMLA ) cream Apply 1 Application topically Every Tuesday,Thursday,and Saturday with dialysis. 06/01/22   [provider]  loratadine  (CLARITIN ) 10 MG tablet TAKE ONE TABLET (10 mg total) BY MOUTH DAILY AT 9AM EVERY MORNING. 04/01/24   Celestia Rosaline SQUIBB, NP  Methoxy PEG-Epoetin  Beta (MIRCERA IJ) 50 mcg. 01/29/24 01/27/25  [provider]  metoCLOPramide  (REGLAN ) 5 MG tablet Take 1 tablet (5 mg total) by mouth every 8 (eight) hours as needed for nausea. 07/10/23   Celestia Rosaline SQUIBB, NP  midodrine  (PROAMATINE ) 10 MG tablet Take 1 tablet (10 mg total) by mouth 2 (two) times daily. 04/25/24   Cheryle Page, MD  Multiple Vitamins-Minerals (MULTIVITAMIN WOMEN PO) Take 1 tablet by mouth daily.    [provider]  nitroGLYCERIN  (NITROSTAT ) 0.4 MG SL tablet Place 1 tablet (0.4 mg total) under the tongue every 5 (five) minutes x 3 doses as needed for chest pain. 11/08/23   Lonni Slain, MD  oxyCODONE -acetaminophen  (PERCOCET/ROXICET) 5-325 MG tablet Take 1 tablet by mouth every 8 (eight) hours as needed for severe pain (pain score 7-10). 04/25/24   Cheryle Page, MD  pantoprazole  (PROTONIX ) 40 MG tablet TAKE ONE TABLET BY MOUTH DAILY AT 9AM 04/25/24   Celestia Rosaline SQUIBB, NP  polyethylene glycol powder (GLYCOLAX /MIRALAX ) 17 GM/SCOOP powder Dissolve 1 capful (17g) in 4-8 ounces of liquid and take by mouth daily as needed 04/25/24   Cheryle Page, MD  rosuvastatin  (CRESTOR ) 10 MG tablet TAKE ONE TABLET BY MOUTH DAILY AT 5PM 04/04/24   Bensimhon, Daniel R, MD  senna (SENOKOT) 8.6 MG TABS tablet Take 1 tablet (8.6 mg total) by mouth 2 (two) times daily. 04/25/24   Cheryle Page, MD    Physical Exam: Vitals:   05/16/24  2000 05/16/24 2015 05/16/24 2030 05/16/24 2145  BP: (!) 143/127 113/67 122/62 (!) 142/111  Pulse: 64  74 73  Resp: 16 16 16 16   Temp:      TempSrc:      SpO2: 91%  (!) 89% 95%  Weight:      Height:       Constitutional: Sitting up in bed, appears fatigued, actively vomiting Eyes: EOMI, lids and conjunctivae normal ENMT: Posterior pharynx clear of any exudate or lesions.Normal dentition.  Neck: normal, supple, no masses. Respiratory: clear to auscultation bilaterally, no wheezing, no crackles. Normal respiratory effort. No accessory muscle use.  Cardiovascular: Regular rate and rhythm, no murmurs / rubs / gallops. No extremity edema. 2+ pedal pulses.  LUE AVF with palpable thrill. Abdomen: Mild generalized tenderness, no guarding, abdomen is soft.  No masses palpated. Musculoskeletal: no clubbing / cyanosis. No joint deformity upper and lower extremities. Good ROM, no contractures. Normal muscle tone.  Skin: no rashes, lesions, ulcers. No induration Neurologic: Sensation intact. Strength 5/5 in all 4.  Psychiatric: Normal judgment and insight. Alert and oriented x 3.   EKG: Personally reviewed. V paced rhythm, QTc 646.  Previous EKG showed atrial flutter with predominant 4:1 AV block.  Assessment/Plan Principal Problem:   Intractable nausea and vomiting Active Problems:   ESRD on hemodialysis (HCC)   COPD (chronic obstructive pulmonary disease) (HCC)   Type 2 diabetes mellitus with hyperlipidemia (HCC)   Chronic heart failure with preserved ejection fraction (HFpEF, >= 50%) (HCC)   OSA on CPAP   Chronic hypotension   HLD (hyperlipidemia)   RAEGHAN DEMETER is a 68 y.o. female with medical history significant for ESRD on TTS HD, permanent A-fib/flutter on Eliquis  s/p maze procedure/prior DCCVs and AV nodal ablation 04/23/2024, chronic HFpEF, s/p leadless PPM, s/p mitral and tricuspid valve repair, COPD, chronic hypotension on midodrine , T2DM, HLD, history of DVT,  depression/anxiety, gastroparesis, OSA who is admitted with intractable nausea and vomiting.  Assessment and Plan: Intractable nausea and vomiting: Persistent symptoms for 1 week, unable to maintain adequate oral intake.  Labs and vitals have been stable.  CT A/P negative for acute etiology.  She has a history of gastroparesis which is suspected to be the primary cause.  She has a history of QT prolongation. - Trial IM Tigan  200 mg q6h prn - IV Protonix  40 mg twice daily - NPO tonight, allow ice chips and sips with meds as tolerated  Permanent atrial fibrillation/flutter S/p leadless PPM 03/27/2024 S/p AV nodal ablation 04/23/2024: Rate is stable.  Continue Eliquis .  ESRD on TTS HD: Patient reports adherence to HD without missed session.  Will need nephrology consult for routine HD needs in AM.  Chronic HFpEF S/p MV and TV repair: Volume status is stable and managed with HD.  COPD: Stable.  Continue albuterol  as needed.  Type 2 diabetes: Well-controlled with last A1c 5.0%.  Chronic hypotension: Continue midodrine .  History of DVT: Continue Eliquis .  Hyperlipidemia: Continue rosuvastatin .  Depression/anxiety: Continue BuSpar  and Cymbalta .  OSA: Hold CPAP tonight given active nausea/vomiting.   DVT prophylaxis:  apixaban  (ELIQUIS ) tablet 5 mg   Code Status: Full code, confirmed with patient on admission Family Communication: Discussed with patient, she has discussed with family Disposition Plan: From home, dispo pending clinical progress Consults called: None Severity of Illness: The appropriate patient status for this patient is OBSERVATION. Observation status is judged to be reasonable and necessary in order to provide the required intensity of service to ensure the patient's safety. The patient's presenting symptoms, physical exam findings, and initial radiographic and laboratory data in the context of their medical condition is felt to place them at decreased risk for  further clinical deterioration. Furthermore, it is anticipated that the patient will be medically stable for  discharge from the hospital within 2 midnights of admission.   Jorie Blanch MD [facility]  If 7PM-7AM, please contact night-coverage [email or web address]  05/16/2024, 11:24 PM

## 2024-05-16 NOTE — ED Triage Notes (Signed)
 BIB [facility] from home-   C/c- fever, nausea, chills, decreased appetite x1 week. Pt reports abdominal pain and vomiting. Has not missed any dialysis appointments. Aox4, GCS 15, ambulatory with assistance. Hx of diabetes.  97.4  B/P- 96p Hr 100 BGL 92

## 2024-05-16 NOTE — ED Notes (Signed)
 Delay in labs and medication administration. Unable to obtain access after IV attempts. Waiting for IV team at this time.

## 2024-05-16 NOTE — ED Triage Notes (Signed)
 To ed via [facility] c/o abd. Pain onset Sat. States she goes to dialysis T-TH-S last dialysis was yest States she was given Phergan and was able to complete dialysis. No diarrhea.

## 2024-05-16 NOTE — ED Provider Notes (Signed)
 Received patient in signout from previous provider pending lab work.  See his note.  In short, patient presents to Emergency Department for evaluation of N/V, abdominal pain intermittently over the past week.  She is ESRD on HD.  Has not missed any dialysis sessions.  No diarrhea.  History of diabetes, gastroparesis.  PE notable for generalized abdominal tenderness specifically in LUQ, LLQ.  No peritoneal signs.  Vital signs hemodynamically stable with no fever here in ED  Lab work notable for elevated creatinine consistent with ESRD on HD, troponin downtrending to normal.  Provided Dilaudid  (x 3), Ativan , Zofran  (x 2) for pain and nausea, vomiting without improvement.  Patient has a pacemaker.  EKG shows paced rhythm with no prolonged QT today.  Did not provide Reglan  for gastroparesis as per typical pain, N/V control secondary to history of prolonged QT.    CT abdomen pelvis without acute abnormalities.  Attempted to p.o. challenge patient who vomited and continues to complain of nausea.  Despite 3 doses of Dilaudid , Ativan , patient continues to complain of intractable pain. She states  I cannot be sent home in this condition.  Will admit patient for intractable pain, vomiting  Dr.Patel accepts patient for admission   Leslie Tinnie BRAVO, PA 05/16/24 2221    Lenor Hollering, MD 05/19/24 [phone]

## 2024-05-17 DIAGNOSIS — R112 Nausea with vomiting, unspecified: Secondary | ICD-10-CM | POA: Diagnosis not present

## 2024-05-17 LAB — BASIC METABOLIC PANEL WITH GFR
Anion gap: 19 — ABNORMAL HIGH (ref 5–15)
BUN: 26 mg/dL — ABNORMAL HIGH (ref 8–23)
CO2: 25 mmol/L (ref 22–32)
Calcium: 10.4 mg/dL — ABNORMAL HIGH (ref 8.9–10.3)
Chloride: 92 mmol/L — ABNORMAL LOW (ref 98–111)
Creatinine, Ser: 9.23 mg/dL — ABNORMAL HIGH (ref 0.44–1.00)
GFR, Estimated: 4 mL/min — ABNORMAL LOW (ref >60)
Glucose, Bld: 93 mg/dL (ref 70–99)
Potassium: 4.1 mmol/L (ref 3.5–5.1)
Sodium: 136 mmol/L (ref 135–145)

## 2024-05-17 LAB — CBC
HCT: 40 % (ref 36.0–46.0)
Hemoglobin: 12.8 g/dL (ref 12.0–15.0)
MCH: 30.6 pg (ref 26.0–34.0)
MCHC: 32 g/dL (ref 30.0–36.0)
MCV: 95.7 fL (ref 80.0–100.0)
Platelets: 171 K/uL (ref 150–400)
RBC: 4.18 MIL/uL (ref 3.87–5.11)
RDW: 13.8 % (ref 11.5–15.5)
WBC: 5.8 K/uL (ref 4.0–10.5)
nRBC: 0 % (ref 0.0–0.2)

## 2024-05-17 LAB — HEPATITIS B SURFACE ANTIGEN: Hepatitis B Surface Ag: NONREACTIVE

## 2024-05-17 MED ORDER — HEPARIN SODIUM (PORCINE) 1000 UNIT/ML DIALYSIS
2000.0000 [IU] | Freq: Once | INTRAMUSCULAR | Status: DC
Start: 2024-05-17 — End: 2024-05-17
  Filled 2024-05-17: qty 2

## 2024-05-17 MED ORDER — LIDOCAINE-PRILOCAINE 2.5-2.5 % EX CREA: 1.0000 | TOPICAL_CREAM | CUTANEOUS | Status: DC | PRN

## 2024-05-17 MED ORDER — LIDOCAINE HCL (PF) 1 % IJ SOLN: 5.0000 mL | INTRAMUSCULAR | Status: DC | PRN

## 2024-05-17 MED ORDER — DIPHENHYDRAMINE-ZINC ACETATE 2-0.1 % EX CREA
TOPICAL_CREAM | Freq: Three times a day (TID) | CUTANEOUS | Status: DC | PRN
  Filled 2024-05-17 (×2): qty 28

## 2024-05-17 MED ORDER — PENTAFLUOROPROP-TETRAFLUOROETH EX AERO: 1.0000 | INHALATION_SPRAY | CUTANEOUS | Status: DC | PRN

## 2024-05-17 MED ORDER — DIPHENHYDRAMINE HCL 25 MG PO CAPS
25.0000 mg | ORAL_CAPSULE | Freq: Four times a day (QID) | ORAL | Status: DC | PRN
  Administered 2024-05-17: 25 mg via ORAL
  Filled 2024-05-17: qty 1

## 2024-05-17 MED ORDER — ALTEPLASE 2 MG IJ SOLR: 2.0000 mg | Freq: Once | INTRAMUSCULAR | Status: DC | PRN

## 2024-05-17 MED ORDER — HEPARIN SODIUM (PORCINE) 1000 UNIT/ML DIALYSIS: 1000.0000 [IU] | INTRAMUSCULAR | Status: DC | PRN

## 2024-05-17 MED ORDER — ANTICOAGULANT SODIUM CITRATE 4% (200MG/5ML) IV SOLN: 5.0000 mL | Status: DC | PRN

## 2024-05-17 MED ORDER — HYDROXYZINE HCL 10 MG PO TABS
10.0000 mg | ORAL_TABLET | Freq: Once | ORAL | Status: AC
  Administered 2024-05-17: 10 mg via ORAL
  Filled 2024-05-17: qty 1

## 2024-05-17 NOTE — Plan of Care (Signed)
   Problem: Clinical Measurements: Goal: Will remain free from infection Outcome: Progressing Goal: Diagnostic test results will improve Outcome: Progressing   Problem: Activity: Goal: Risk for activity intolerance will decrease Outcome: Progressing

## 2024-05-17 NOTE — Progress Notes (Signed)
 Pt is c/o itching and wanting something for it. RN messaged MD on call to see if we can get an order for something.   Bari HERO [name]

## 2024-05-17 NOTE — Progress Notes (Signed)
 New Admission Note:  Arrival Method: By bed from ED around 0030 Mental Orientation: Alert and oriented x 4  Telemetry: Box 1, CCMD notified Assessment: Completed Skin: Completed, refer to flowsheets IV: R forearm S.L. Pain: Denies Tubes: None Safety Measures: Safety Fall Prevention Plan was given, discussed and signed. Admission: Completed [facility] Orientation: Patient has been orientated to the room, unit and the staff. Family: None  Orders have been reviewed and implemented. Will continue to monitor the patient. Call light has been placed within reach and bed alarm has been activated.   Bari Lor, RN  Phone Number: [phone]

## 2024-05-17 NOTE — Progress Notes (Signed)
 PROGRESS NOTE  [name]  DOB: 08/02/1956  PCP: Celestia Rosaline SQUIBB, NP FMW:[id number]  DOA: 05/16/2024  LOS: 0 days  Hospital Day: 2  Brief narrative: ROKHAYA Gallagher is a 68 y.o. female with PMH significant for ESRD HD TTS, morbid obesity, OSA, DM2, HLD, permanent A-fib on Eliquis  s/p maze procedure/prior DCCV's and AV nodal ablation 04/23/2024 s/p leadless PPM, s/p mitral and tricuspid valve repair, CHF, chronic hypotension on midodrine  CKD, GERD, h/o DVT, anemia, arthritis, anxiety/depression, gastroparesis. 9/26, patient presented to the ED for evaluation of abdominal pain, nausea, vomiting, poor oral intake for 1 week.   Last dialysis on the schedule was the day prior on 9/25.  In the ED, afebrile, hemodynamically stable CBC unremarkable, BMP with creatinine 8.14 CT abdomen/pelvis without contrast negative for acute findings.  Stable calcified left renal artery aneurysms, stable mild cardiomegaly, aortic atherosclerosis, and stable compression fractures of the thoracolumbar spine noted. Patient was given IV Dilaudid  1 mg x 3, IV Ativan  1 mg, IV Zofran  x 2.   Patient failed p.o. challenge x 2.   Admitted to [facility]    Subjective: Patient was seen and examined this morning at dialysis. Elderly African-American female. Felt nauseous.   Vomited earlier this morning.  Only took a little bit of clear liquid diet prior to dialysis initiation Remains afebrile, hemodynamically stable  Assessment and plan: Intractable abdominal pain, nausea and vomiting H/o gastroparesis Presented with 1 week of abdominal pain, nausea, vomiting, poor intake  CT abdomen and pelvis negative for acute pathology. Suspect acute flareup of gastroparesis She has a history of QT prolongation. 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  PTA meds- continue Eliquis .   ESRD on TTS HD: Last dialysis on the schedule was 9/24.  Nephrology consulted   Chronic HFpEF S/p MV and TV repair: Volume status is stable and managed with HD.   COPD: Stable.  Continue albuterol  as needed.   Type 2 diabetes: Well-controlled with last A1c 5.0%.   Chronic hypotension: Continue midodrine .   H/o DVT: Continue Eliquis .   Hyperlipidemia: Continue rosuvastatin .   Depression/anxiety: Continue BuSpar  and Cymbalta .   OSA: Hold CPAP tonight given active nausea/vomiting.   Mobility: Encourage ambulation PT Orders:   PT Follow up Rec:    Goals of care   Code Status: Full Code     DVT prophylaxis:   apixaban  (ELIQUIS ) tablet 5 mg   Antimicrobials: None Fluid: None Consultants: Nephrology Family Communication: None at bedside  Status: Observation Level of care:  Telemetry Medical   Patient is from: Home Needs to continue in-hospital care: Continues to have nausea, vomiting, inability tolerate oral intake Anticipated d/c to: Pending clinical course      Diet:  Diet Order             Diet clear liquid Room service appropriate? Yes; Fluid consistency: Thin  Diet effective now                   Scheduled Meds:  apixaban   5 mg Oral BID   busPIRone   5 mg Oral BID   DULoxetine   60 mg Oral Daily   heparin   2,000 Units Dialysis Once in dialysis   midodrine   10 mg Oral Daily   pantoprazole  (PROTONIX ) IV  40  mg Intravenous Q12H   rosuvastatin   10 mg Oral Daily   sodium chloride  flush  3 mL Intravenous Q12H    PRN meds: acetaminophen  **OR** acetaminophen , albuterol , alteplase , anticoagulant sodium citrate , diphenhydrAMINE -zinc  acetate, heparin , lidocaine  (PF), lidocaine -prilocaine , pentafluoroprop-tetrafluoroeth, senna-docusate, trimethobenzamide    Infusions:   anticoagulant sodium citrate       Antimicrobials: Anti-infectives (From admission,  onward)    None       Objective: Vitals:   05/17/24 1300 05/17/24 1330  BP: 122/61 111/66  Pulse: 70 77  Resp: 15 17  Temp:    SpO2: 95% 100%    Intake/Output Summary (Last 24 hours) at 05/17/2024 1412 Last data filed at 05/17/2024 0600 Gross per 24 hour  Intake 0 ml  Output --  Net 0 ml   Filed Weights   05/16/24 1335 05/17/24 1145 05/17/24 1148  Weight: 120.7 kg 119.5 kg 119.5 kg   Weight change:  Body mass index is 41.26 kg/m.   Physical Exam: General exam: Pleasant, elderly African-American female Skin: No rashes, lesions or ulcers. HEENT: Atraumatic, normocephalic, no obvious bleeding Lungs: Clear to auscultation bilaterally,  CVS: S1, S2, no murmur,   GI/Abd: Soft, mild epigastric tenderness, nondistended, bowel sound present,   CNS: Alert, awake, oriented x 3 Psychiatry: Sad affect Extremities: No pedal edema, no calf tenderness,   Data Review: I have personally reviewed the laboratory data and studies available.  F/u labs ordered Unresulted Labs (From admission, onward)     Start     Ordered   05/18/24 0500  Basic metabolic panel with GFR  Tomorrow morning,   R       Question:  Specimen collection method  Answer:  Lab=Lab collect   05/17/24 1412   05/17/24 0827  Hepatitis B surface antigen  (New Ad[facility] (Hepatitis B))  Once,   R       Question:  Specimen collection method  Answer:  Lab=Lab collect   05/17/24 0827   05/17/24 0827  Hepatitis B surface antibody,quantitative  (New Ad[facility] (Hepatitis B))  Once,   R       Question:  Specimen collection method  Answer:  Lab=Lab collect   05/17/24 0827            Signed, Chapman Rota, MD [facility] 05/17/2024

## 2024-05-17 NOTE — Plan of Care (Signed)
   Problem: Health Behavior/Discharge Planning: Goal: Ability to manage health-related needs will improve Outcome: Progressing

## 2024-05-17 NOTE — Consult Note (Signed)
 [facility] Renal Consultation Note    Indication for Consultation:  Management of ESRD/hemodialysis, anemia, hypertension/volume, and secondary hyperparathyroidism.  HPI: [name] is a 68 y.o. female with ESRD on HD TTS, hypotension on midodrine , T2DM with frequent gastroparesis, Hx A-fib/flutter s/p AV nodal ablation 04/23/24, Hx leadless PPM, Hx MV/TV repairs, and OSA who was admitted with abd pain and intractable N/V.  Presented to the ED on 9/26 via EMS from home with c/o abdominal pain and N/V for 1 week, unable to keep food down. Was actually in ED a few days ago - 9/23 - with CP, negative work-up at the time, she was dialyzed and discharged. In ED, vitals were normal. Labs with Na 136, K 4.1, CO2 25, BUN 26, Ca 10.4, WBC 5.8, Hgb 12.8, Plts 171. Abd CT completed - no acute findings. She is being treated for presumed gastroparesis.  Seen in room this AM - endorses abdominal pain, primarily in LLQ as well as nausea/vomiting. Also, c/o mild CP and dyspnea. She is afebrile.   Dialyzes on TTS schedule at [facility] unit. She did have a treatment on Thurs 9/25 - left below prior EDW (at 119.3kg). Her AVF has been functioning well without recent issues.  Past Medical History:  Diagnosis Date   Acute on chronic systolic CHF (congestive heart failure) (HCC) 12/21/2020   Allergic rhinitis    Anemia    Arthritis    Asthma    Atrial fibrillation with RVR (HCC) 11/09/2023   Chronic diastolic CHF (congestive heart failure) (HCC)    Chronic kidney disease    Dialysis Tu/TH/Sa   COPD (chronic obstructive pulmonary disease) (HCC)    COVID-19 03/28/2023   Depression    DM (diabetes mellitus) (HCC)    DVT (deep vein thrombosis) in pregnancy    Gastroenteritis 03/27/2023   GERD (gastroesophageal reflux disease)    HCAP (healthcare-associated pneumonia) 06/21/2022   HTN (hypertension)    Hyperlipidemia    Obesity    Pneumonia 04/2018   RIGHT LOBE   Sleep apnea     needs to be retested - to get new cpap   Past Surgical History:  Procedure Laterality Date   ABDOMINAL HYSTERECTOMY     AV FISTULA PLACEMENT Left 03/17/2022   Procedure: LEFT ARM ARTERIOVENOUS (AV) FISTULA CREATION;  Surgeon: Eliza Lonni RAMAN, MD;  Location: [facility] OR;  Service: Vascular;  Laterality: Left;   AV NODE ABLATION N/A 04/23/2024   Procedure: AV NODE ABLATION;  Surgeon: Waddell Danelle ORN, MD;  Location: [facility] INVASIVE CV LAB;  Service: Cardiovascular;  Laterality: N/A;   BIOPSY  01/12/2023   Procedure: BIOPSY;  Surgeon: Rollin Dover, MD;  Location: [facility];  Service: Gastroenterology;;   VASSIE STUDY  11/26/2020   Procedure: BUBBLE STUDY;  Surgeon: Lonni Slain, MD;  Location: [facility] ENDOSCOPY;  Service: Cardiovascular;;   CARDIOVERSION N/A 05/06/2020   Procedure: CARDIOVERSION;  Surgeon: Lonni Slain, MD;  Location: [facility] ENDOSCOPY;  Service: Cardiovascular;  Laterality: N/A;   CARDIOVERSION N/A 11/04/2020   Procedure: CARDIOVERSION;  Surgeon: Pietro Redell RAMAN, MD;  Location: [facility] ENDOSCOPY;  Service: Cardiovascular;  Laterality: N/A;   CARDIOVERSION N/A 02/01/2021   Procedure: CARDIOVERSION;  Surgeon: Cherrie Toribio SAUNDERS, MD;  Location: [facility] ENDOSCOPY;  Service: Cardiovascular;  Laterality: N/A;   CARDIOVERSION N/A 02/09/2021   Procedure: CARDIOVERSION;  Surgeon: Cherrie Toribio SAUNDERS, MD;  Location: [facility] ENDOSCOPY;  Service: Cardiovascular;  Laterality: N/A;   CARDIOVERSION N/A 04/19/2021   Procedure: CARDIOVERSION;  Surgeon: Okey Vina GAILS,  MD;  Location: [facility] ENDOSCOPY;  Service: Cardiovascular;  Laterality: N/A;   CARDIOVERSION N/A 11/25/2021   Procedure: CARDIOVERSION;  Surgeon: Cherrie Toribio SAUNDERS, MD;  Location: [facility] ENDOSCOPY;  Service: Cardiovascular;  Laterality: N/A;   CARDIOVERSION N/A 01/31/2022   Procedure: CARDIOVERSION;  Surgeon: Cherrie Toribio SAUNDERS, MD;  Location: [facility] ENDOSCOPY;  Service: Cardiovascular;  Laterality: N/A;   CARDIOVERSION N/A 07/23/2023   Procedure:  CARDIOVERSION (CATH LAB);  Surgeon: Francyne Headland, MD;  [facility]: [facility];  Service: Cardiovascular;  Laterality: N/A;   CARDIOVERSION N/A 12/05/2023   Procedure: CARDIOVERSION;  Surgeon: Cherrie Toribio SAUNDERS, MD;  [facility]: [facility];  Service: Cardiovascular;  Laterality: N/A;   CATARACT EXTRACTION, BILATERAL     CHOLECYSTECTOMY     COLONOSCOPY WITH PROPOFOL  N/A 03/05/2015   Procedure: COLONOSCOPY WITH PROPOFOL ;  Surgeon: Belvie Just, MD;  Location: [facility] ENDOSCOPY;  Service: Endoscopy;  Laterality: N/A;   DIAGNOSTIC LAPAROSCOPY     ESOPHAGOGASTRODUODENOSCOPY (EGD) WITH PROPOFOL  N/A 01/12/2023   Procedure: ESOPHAGOGASTRODUODENOSCOPY (EGD) WITH PROPOFOL ;  Surgeon: Just Belvie, MD;  Location: [facility] ENDOSCOPY;  Service: Gastroenterology;  Laterality: N/A;   FISTULA SUPERFICIALIZATION Left 03/17/2022   Procedure: FISTULA SUPERFICIALIZATION -LEFT;  Surgeon: Eliza Lonni RAMAN, MD;  Location: [facility] OR;  Service: Vascular;  Laterality: Left;   ingrown hallux Left    IR FLUORO GUIDE CV LINE RIGHT  03/14/2022   IR US  GUIDE VASC ACCESS RIGHT  03/14/2022   KNEE SURGERY     LEFT HEART CATH AND CORONARY ANGIOGRAPHY N/A 12/31/2017   Procedure: LEFT HEART CATH AND CORONARY ANGIOGRAPHY;  Surgeon: Levern Hutching, MD;  [facility];  Service: Cardiovascular;  Laterality: N/A;   MAZE N/A 12/29/2020   Procedure: MAZE;  Surgeon: Kerrin Elspeth BROCKS, MD;  Location: [facility] OR;  Service: Open Heart Surgery;  Laterality: N/A;   MITRAL VALVE REPAIR N/A 12/29/2020   Procedure: MITRAL VALVE REPAIR USING CARBOMEDICS ANNULOFLEX RING SIZE 30;  Surgeon: Kerrin Elspeth BROCKS, MD;  Location: [facility] OR;  Service: Open Heart Surgery;  Laterality: N/A;   PACEMAKER LEADLESS INSERTION N/A 03/27/2024   Procedure: PACEMAKER LEADLESS INSERTION;  Surgeon: Kennyth Chew, MD;  Location: [facility] INVASIVE CV LAB;  Service: Cardiovascular;  Laterality: N/A;   REVISON OF ARTERIOVENOUS FISTULA Left 01/05/2023   Procedure:  REVISION OF LEFT ARTERIOVENOUS FISTULA WITH INTERPOSITION PTFE GRAFT;  Surgeon: Eliza Lonni RAMAN, MD;  Location: [facility] OR;  Service: Vascular;  Laterality: Left;  ELIQUIS  AND HOLD 48 X HOURS   RIGHT HEART CATH N/A 12/22/2020   Procedure: RIGHT HEART CATH;  Surgeon: Rolan Ezra RAMAN, MD;  Location: [facility] INVASIVE CV LAB;  Service: Cardiovascular;  Laterality: N/A;   RIGHT HEART CATH N/A 01/31/2021   Procedure: RIGHT HEART CATH;  Surgeon: Cherrie Toribio SAUNDERS, MD;  [facility];  Service: Cardiovascular;  Laterality: N/A;   RIGHT HEART CATH N/A 07/29/2021   Procedure: RIGHT HEART CATH;  Surgeon: Cherrie Toribio SAUNDERS, MD;  [facility];  Service: Cardiovascular;  Laterality: N/A;   RIGHT HEART CATH N/A 11/24/2021   Procedure: RIGHT HEART CATH;  Surgeon: Cherrie Toribio SAUNDERS, MD;  [facility];  Service: Cardiovascular;  Laterality: N/A;   RIGHT HEART CATH N/A 02/14/2022   Procedure: RIGHT HEART CATH;  Surgeon: Cherrie Toribio SAUNDERS, MD;  [facility];  Service: Cardiovascular;  Laterality: N/A;   RIGHT HEART CATH N/A 02/22/2022   Procedure: RIGHT HEART CATH;  Surgeon: Rolan Ezra RAMAN,  MD;  [facility];  Service: Cardiovascular;  Laterality: N/A;   TEE WITHOUT CARDIOVERSION N/A 11/26/2020   Procedure: TRANSESOPHAGEAL ECHOCARDIOGRAM (TEE);  Surgeon: Lonni Slain, MD;  Location: [facility] ENDOSCOPY;  Service: Cardiovascular;  Laterality: N/A;   TEE WITHOUT CARDIOVERSION N/A 12/29/2020   Procedure: TRANSESOPHAGEAL ECHOCARDIOGRAM (TEE);  Surgeon: Kerrin Elspeth BROCKS, MD;  Location: [facility] OR;  Service: Open Heart Surgery;  Laterality: N/A;   TEE WITHOUT CARDIOVERSION N/A 01/20/2022   Procedure: TRANSESOPHAGEAL ECHOCARDIOGRAM (TEE);  Surgeon: Cherrie Toribio SAUNDERS, MD;  Location: [facility] ENDOSCOPY;  Service: Cardiovascular;  Laterality: N/A;   TRICUSPID VALVE REPLACEMENT N/A 12/29/2020   Procedure: 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  RING SIZE 34;  Surgeon: Kerrin Elspeth BROCKS, MD;  Location: [facility] OR;  Service: Open Heart Surgery;  Laterality: N/A;   Family History  Problem Relation Age of Onset   Breast cancer Mother    Cancer Mother    Hypertension Mother    Cancer Father    Hypertension Sister    Hypertension Brother    CAD Other    Social History:  reports that she has never smoked. She has never been exposed to tobacco smoke. She has never used smokeless tobacco. She reports that she does not drink alcohol  and does not use drugs.  ROS: As per HPI otherwise negative.  Physical Exam: Vitals:   05/16/24 2145 05/17/24 0056 05/17/24 0523 05/17/24 0843  BP: (!) 142/111 132/70 126/66 125/71  Pulse: 73 65 68 76  Resp: 16     Temp:  97.6 F (36.4 C) (!) 97.4 F (36.3 C) (!) 97.5 F (36.4 C)  TempSrc:  Oral Oral Oral  SpO2: 95% 100% 99% 92%  Weight:      Height:         General: Well developed, well nourished, in no acute distress. Room air Head: Normocephalic, atraumatic, sclera non-icteric, mucus membranes are moist. Neck: Supple without lymphadenopathy/masses. JVD not elevated. Lungs: R base with rales, otherwise clear Heart: RRR with normal S1, S2. No murmurs, rubs, or gallops appreciated. Abdomen: Soft, mild TTP in LLQ without guarding or rebound tenderness Musculoskeletal:  Strength and tone appear normal for age. Lower extremities: No edema or ischemic changes, no open wounds. Neuro: Alert and oriented X 3. Moves all extremities spontaneously. Psych:  Responds to questions appropriately with a normal affect. Dialysis Access: LUE AVF +t/b  Allergies  Allergen Reactions   Bee Venom Anaphylaxis   Cefuroxime Itching and Nausea And Vomiting   Sulfa Antibiotics Itching and Rash   Chlorhexidine      Unknown rxn.   Iodinated Contrast Media Nausea And Vomiting   Prior to Admission medications   Medication Sig Start Date End Date Taking? Authorizing Provider  acetaminophen  (TYLENOL ) 325 MG tablet  Take 2 tablets (650 mg total) by mouth every 6 (six) hours as needed for mild pain or moderate pain. Patient taking differently: Take 650 mg by mouth daily as needed for mild pain (pain score 1-3) or moderate pain (pain score 4-6). 12/07/21  Yes Arrien, Elidia Toribio, MD  albuterol  (PROVENTIL  HFA) 108 [phone] Base) MCG/ACT inhaler Inhale 1-2 puffs into the lungs every 6 (six) hours as needed for wheezing or shortness of breath. 10/13/22  Yes Celestia Rosaline SQUIBB, NP  AURYXIA  1 GM 210 MG(Fe) tablet Take 210 mg by mouth 3 (three) times daily.   Yes [provider]  busPIRone  (BUSPAR ) 5 MG tablet TAKE ONE TABLET BY MOUTH TWICE DAILY @9AM -5PM Patient taking differently: Take  5 mg by mouth 2 (two) times daily. 02/14/24  Yes Celestia Rosaline SQUIBB, NP  diphenhydrAMINE  (BENADRYL ) 25 MG tablet Take 25 mg by mouth every 6 (six) hours as needed for itching. Patient taking differently: Take 25 mg by mouth as needed for itching. 03/31/22  Yes [provider]  doxercalciferol  (HECTOROL ) 4 MCG/2ML injection Inject 1.5 mLs (3 mcg total) into the vein Every Tuesday,Thursday,and Saturday with dialysis. 03/06/24  Yes Amin, Ankit C, MD  DULoxetine  (CYMBALTA ) 60 MG capsule TAKE ONE CAPSULE (60MG  TOTAL) BY MOUTH DAILY AT 9AM 02/29/24  Yes Edwards, Rosaline SQUIBB, NP  ELIQUIS  5 MG TABS tablet TAKE ONE TABLET (5MG  TOTAL) BY MOUTH TWICE DAILY @9AM -5PM 09/06/23  Yes Bensimhon, Toribio SAUNDERS, MD  fluticasone  (FLONASE  ALLERGY RELIEF) 50 MCG/ACT nasal spray Place 2 sprays into both nostrils daily as needed for allergies. Patient taking differently: Place 2 sprays into both nostrils as needed for allergies. 03/31/22  Yes [provider]  insulin  lispro (HUMALOG  KWIKPEN) 100 UNIT/ML KwikPen For blood sugars 0-150 give 0 units of insulin , 151-200 give 2 units of insulin , 201-250 give 4 units, 251-300 give 6 units, 301-350 give 8 units, 351-400 give 10 units,> 400 give 12 units and call M.D. Patient taking differently: Inject 1-2  Units into the skin 3 (three) times daily. Per sliding scale 09/27/23  Yes Newlin, Enobong, MD  lidocaine -prilocaine  (EMLA ) cream Apply 1 Application topically Every Tuesday,Thursday,and Saturday with dialysis. 06/01/22  Yes [provider]  loratadine  (CLARITIN ) 10 MG tablet TAKE ONE TABLET (10 mg total) BY MOUTH DAILY AT 9AM EVERY MORNING. 04/01/24  Yes Celestia Rosaline SQUIBB, NP  Methoxy PEG-Epoetin  Beta (MIRCERA IJ) 50 mcg. 01/29/24 01/27/25 Yes [provider]  metoCLOPramide  (REGLAN ) 5 MG tablet Take 1 tablet (5 mg total) by mouth every 8 (eight) hours as needed for nausea. 07/10/23  Yes Celestia Rosaline SQUIBB, NP  midodrine  (PROAMATINE ) 10 MG tablet Take 1 tablet (10 mg total) by mouth 2 (two) times daily. Patient taking differently: Take 10 mg by mouth daily. May take 1 additional tablet as needed at dialysis, but never needed at home 04/25/24  Yes Cheryle Page, MD  Multiple Vitamins-Minerals (MULTIVITAMIN WOMEN PO) Take 1 tablet by mouth daily.   Yes [provider]  oxyCODONE -acetaminophen  (PERCOCET/ROXICET) 5-325 MG tablet Take 1 tablet by mouth every 8 (eight) hours as needed for severe pain (pain score 7-10). 04/25/24  Yes Cheryle Page, MD  pantoprazole  (PROTONIX ) 40 MG tablet TAKE ONE TABLET BY MOUTH DAILY AT 9AM Patient taking differently: Take 40 mg by mouth daily as needed (for acid reflux). 04/25/24  Yes Celestia Rosaline SQUIBB, NP  polyethylene glycol powder (GLYCOLAX /MIRALAX ) 17 GM/SCOOP powder Dissolve 1 capful (17g) in 4-8 ounces of liquid and take by mouth daily as needed 04/25/24  Yes Alekh, Kshitiz, MD  rosuvastatin  (CRESTOR ) 10 MG tablet TAKE ONE TABLET BY MOUTH DAILY AT 5PM 04/04/24  Yes Bensimhon, Daniel R, MD  senna (SENOKOT) 8.6 MG TABS tablet Take 1 tablet (8.6 mg total) by mouth 2 (two) times daily. Patient taking differently: Take 1 tablet by mouth daily as needed for mild constipation. 04/25/24  Yes Cheryle Page, MD  EPINEPHrine  0.3 mg/0.3 mL IJ SOAJ injection  Inject 0.3 mg into the muscle as needed for anaphylaxis. Patient not taking: Reported on 05/16/2024 06/25/20   Celestia Rosaline SQUIBB, NP  gabapentin  (NEURONTIN ) 100 MG capsule Take 1 capsule (100 mg total) by mouth every Tuesday, Thursday, and Saturday at 6 PM. Patient not taking: Reported on 05/16/2024  04/26/24 05/26/24  Cheryle Page, MD  nitroGLYCERIN  (NITROSTAT ) 0.4 MG SL tablet Place 1 tablet (0.4 mg total) under the tongue every 5 (five) minutes x 3 doses as needed for chest pain. Patient not taking: Reported on 05/16/2024 11/08/23   Lonni Slain, MD   Current Facility-Administered Medications  Medication Dose Route Frequency Provider Last Rate Last Admin   acetaminophen  (TYLENOL ) tablet 650 mg  650 mg Oral Q6H PRN Tobie Jorie SAUNDERS, MD   650 mg at 05/17/24 0901   Or   acetaminophen  (TYLENOL ) suppository 650 mg  650 mg Rectal Q6H PRN Tobie Jorie SAUNDERS, MD       albuterol  (PROVENTIL ) (2.5 MG/3ML) 0.083% nebulizer solution 2.5 mg  2.5 mg Inhalation Q6H PRN Tobie Jorie SAUNDERS, MD       apixaban  (ELIQUIS ) tablet 5 mg  5 mg Oral BID Tobie Jorie R, MD   5 mg at 05/17/24 9150   busPIRone  (BUSPAR ) tablet 5 mg  5 mg Oral BID Tobie Jorie R, MD   5 mg at 05/17/24 9150   diphenhydrAMINE -zinc  acetate (BENADRYL ) 2-0.1 % cream   Topical TID PRN Debby Camila LABOR, MD       DULoxetine  (CYMBALTA ) DR capsule 60 mg  60 mg Oral Daily Tobie Jorie R, MD   60 mg at 05/17/24 0849   midodrine  (PROAMATINE ) tablet 10 mg  10 mg Oral Daily Tobie Jorie R, MD   10 mg at 05/17/24 0849   pantoprazole  (PROTONIX ) injection 40 mg  40 mg Intravenous Q12H Tobie Jorie R, MD   40 mg at 05/17/24 9150   rosuvastatin  (CRESTOR ) tablet 10 mg  10 mg Oral Daily Tobie Jorie SAUNDERS, MD       senna-docusate (Senokot-S) tablet 1 tablet  1 tablet Oral QHS PRN Tobie Jorie SAUNDERS, MD       sodium chloride  flush (NS) 0.9 % injection 3 mL  3 mL Intravenous Q12H Tobie Jorie R, MD   3 mL at 05/17/24 0849   trimethobenzamide  (TIGAN ) injection  200 mg  200 mg Intramuscular Q6H PRN Tobie Jorie SAUNDERS, MD       Labs: Basic Metabolic Panel: Recent Labs  Lab 05/13/24 0727 05/16/24 1334 05/17/24 0308  NA 137 135 136  K 4.6 4.3 4.1  CL 94* 93* 92*  CO2 26 27 25   GLUCOSE 105* 87 93  BUN 36* 22 26*  CREATININE 10.91* 8.14* 9.23*  CALCIUM  9.9 10.0 10.4*   Liver Function Tests: Recent Labs  Lab 05/13/24 0727 05/16/24 1334  AST 17 22  ALT 9 14  ALKPHOS 94 100  BILITOT 0.7 1.1  PROT 7.7 8.4*  ALBUMIN  3.5 3.9   Recent Labs  Lab 05/13/24 0727 05/16/24 1334  LIPASE 15 14   CBC: Recent Labs  Lab 05/13/24 0727 05/16/24 1334 05/17/24 0308  WBC 4.5 5.0 5.8  NEUTROABS 2.8  --   --   HGB 11.7* 12.7 12.8  HCT 36.7 39.4 40.0  MCV 97.6 95.2 95.7  PLT 114* 163 171   Studies/Results: CT ABDOMEN PELVIS WO CONTRAST Result Date: 05/16/2024 CLINICAL DATA:  Abdominal pain. EXAM: CT ABDOMEN AND PELVIS WITHOUT CONTRAST TECHNIQUE: Multidetector CT imaging of the abdomen and pelvis was performed following the standard protocol without IV contrast. RADIATION DOSE REDUCTION: This exam was performed according to the departmental dose-optimization program which includes automated exposure control, adjustment of the mA and/or kV according to patient size and/or use of iterative reconstruction technique. COMPARISON:  04/22/2024 and 04/19/2024 FINDINGS: Lower chest: Mild stable  cardiomegaly. Median sternotomy. Postsurgical changes over the heart including metallic density unchanged over the ventricular septum as well as evidence of previous tricuspid and mitral valve repairs. Lung bases demonstrate mild linear scarring over the left base and are otherwise clear. Hepatobiliary: Previous cholecystectomy. The the liver and biliary tree otherwise unremarkable. The Pancreas: Normal. Spleen: Normal. Adrenals/Urinary Tract: Adrenal glands are normal. The kidneys are normal in size without hydronephrosis or nephrolithiasis. Ureters are normal. Bladder is  nondistended. Stomach/Bowel: Possible small sliding hiatal hernia. Stomach is otherwise unremarkable. Small bowel is normal. Appendix is normal. Colon is normal. Vascular/Lymphatic: Mild calcified plaque over the abdominal aorta which is normal in caliber. Two adjacent calcified stable left renal artery aneurysms the adjacent the renal hilum. The small nodes over the gastrohepatic ligament and porta hepatis unchanged. Reproductive: Uterus and bilateral adnexa are unremarkable. Other: No free fluid or focal inflammatory change. Musculoskeletal: No change in moderate T11 compression fracture. Mild compression deformities of L3-L5 unchanged. IMPRESSION: 1. No acute findings in the abdomen/pelvis. 2. Stable calcified left renal artery aneurysms. 3. Stable mild cardiomegaly. 4. Aortic atherosclerosis. 5. Stable compression fractures of the thoracolumbar spine. Aortic Atherosclerosis (ICD10-I70.0). Electronically Signed   By: Toribio Agreste M.D.   On: 05/16/2024 14:58    Dialysis Orders:  TTS - AF 4hr, 400/600, EDW 123.2kg, 2K/2Ca bath, AVF, heparin  2500 unit bolus - Last HD 9/25, post-wt 119.3kg - ESA on hold, last Hgb 13 - Hectoral 2mcg IV q HD - Sensipar 30mg  PO q HD - just started 9/25  Assessment/Plan:  Abd pain with N/V: Presumed gastroparesis, abd CT negative. On PPI BID and trial IM Tigan  q 6 hr.  Per primary.  ESRD:  Continue HD on usual TTS schedule - for HD today.  Hypotension/volume: BP stable, on midodrine .  Anemia: Hgb > 11, no ESA needed.  Metabolic bone disease: Ca high, Phos pending. Hold VDRA for now, ok to continue binders once eating.  A-flutter s/p recent nodal ablation, has PPM  Izetta Boehringer, PA-C 05/17/2024, 9:50 AM  [employer]

## 2024-05-17 NOTE — Progress Notes (Signed)
 Received patient in bed to unit.  Alert and oriented.  Informed consent signed and in chart.   TX duration:3.5 hours  Patient tolerated well.  Transported back to the room  Alert, without acute distress.  Hand-off given to patient's nurse.   Access used: Left upper arm fistula Access issues: none  Total UF removed: 1L Medication(s) given: none   05/17/24 1539  Vitals  Temp 97.8 F (36.6 C)  BP 119/62  Pulse Rate 77  ECG Heart Rate 71  Resp 15  Weight 118.5 kg  Type of Weight Post-Dialysis  Oxygen  Therapy  SpO2 99 %  O2 Device Room Air  During Treatment Monitoring  Duration of HD Treatment -hour(s) 3.5 hour(s)  HD Safety Checks Performed Yes  Post Treatment  Dialyzer Clearance Clear  Liters Processed 84  Fluid Removed (mL) 1000 mL  Tolerated HD Treatment Yes  AVG/AVF Arterial Site Held (minutes) 7 minutes  AVG/AVF Venous Site Held (minutes) 7 minutes  Fistula / Graft Left Upper arm  No placement date or time found.   Placed prior to admission: Yes  Orientation: Left  Access Location: Upper arm  Site Condition No complications  Fistula / Graft Assessment Present;Thrill;Bruit  Status Deaccessed;Patent  Drainage Description None     Camellia Brasil LPN [facility]

## 2024-05-18 DIAGNOSIS — Z8249 Family history of ischemic heart disease and other diseases of the circulatory system: Secondary | ICD-10-CM | POA: Diagnosis not present

## 2024-05-18 DIAGNOSIS — D631 Anemia in chronic kidney disease: Secondary | ICD-10-CM | POA: Diagnosis not present

## 2024-05-18 DIAGNOSIS — N186 End stage renal disease: Secondary | ICD-10-CM | POA: Diagnosis not present

## 2024-05-18 DIAGNOSIS — I4892 Unspecified atrial flutter: Secondary | ICD-10-CM | POA: Diagnosis present

## 2024-05-18 DIAGNOSIS — I4821 Permanent atrial fibrillation: Secondary | ICD-10-CM | POA: Diagnosis present

## 2024-05-18 DIAGNOSIS — R112 Nausea with vomiting, unspecified: Secondary | ICD-10-CM | POA: Diagnosis not present

## 2024-05-18 DIAGNOSIS — Z950 Presence of cardiac pacemaker: Secondary | ICD-10-CM | POA: Diagnosis not present

## 2024-05-18 DIAGNOSIS — I5042 Chronic combined systolic (congestive) and diastolic (congestive) heart failure: Secondary | ICD-10-CM | POA: Diagnosis present

## 2024-05-18 DIAGNOSIS — E1122 Type 2 diabetes mellitus with diabetic chronic kidney disease: Secondary | ICD-10-CM | POA: Diagnosis not present

## 2024-05-18 DIAGNOSIS — I959 Hypotension, unspecified: Secondary | ICD-10-CM | POA: Diagnosis not present

## 2024-05-18 DIAGNOSIS — I700 Atherosclerosis of aorta: Secondary | ICD-10-CM | POA: Diagnosis present

## 2024-05-18 DIAGNOSIS — N250 Renal osteodystrophy: Secondary | ICD-10-CM | POA: Diagnosis not present

## 2024-05-18 DIAGNOSIS — Z6841 Body Mass Index (BMI) 40.0 and over, adult: Secondary | ICD-10-CM | POA: Diagnosis not present

## 2024-05-18 DIAGNOSIS — R109 Unspecified abdominal pain: Secondary | ICD-10-CM | POA: Diagnosis not present

## 2024-05-18 DIAGNOSIS — I132 Hypertensive heart and chronic kidney disease with heart failure and with stage 5 chronic kidney disease, or end stage renal disease: Secondary | ICD-10-CM | POA: Diagnosis present

## 2024-05-18 DIAGNOSIS — Z8616 Personal history of COVID-19: Secondary | ICD-10-CM | POA: Diagnosis not present

## 2024-05-18 DIAGNOSIS — Z992 Dependence on renal dialysis: Secondary | ICD-10-CM | POA: Diagnosis not present

## 2024-05-18 DIAGNOSIS — Z91030 Bee allergy status: Secondary | ICD-10-CM | POA: Diagnosis not present

## 2024-05-18 DIAGNOSIS — E785 Hyperlipidemia, unspecified: Secondary | ICD-10-CM | POA: Diagnosis present

## 2024-05-18 DIAGNOSIS — Z7901 Long term (current) use of anticoagulants: Secondary | ICD-10-CM | POA: Diagnosis not present

## 2024-05-18 DIAGNOSIS — R1084 Generalized abdominal pain: Secondary | ICD-10-CM | POA: Diagnosis present

## 2024-05-18 DIAGNOSIS — F32A Depression, unspecified: Secondary | ICD-10-CM | POA: Diagnosis present

## 2024-05-18 DIAGNOSIS — J4489 Other specified chronic obstructive pulmonary disease: Secondary | ICD-10-CM | POA: Diagnosis present

## 2024-05-18 DIAGNOSIS — Z882 Allergy status to sulfonamides status: Secondary | ICD-10-CM | POA: Diagnosis not present

## 2024-05-18 DIAGNOSIS — E1169 Type 2 diabetes mellitus with other specified complication: Secondary | ICD-10-CM | POA: Diagnosis present

## 2024-05-18 DIAGNOSIS — N2581 Secondary hyperparathyroidism of renal origin: Secondary | ICD-10-CM | POA: Diagnosis present

## 2024-05-18 DIAGNOSIS — E1143 Type 2 diabetes mellitus with diabetic autonomic (poly)neuropathy: Secondary | ICD-10-CM | POA: Diagnosis present

## 2024-05-18 DIAGNOSIS — Z794 Long term (current) use of insulin: Secondary | ICD-10-CM | POA: Diagnosis not present

## 2024-05-18 LAB — BASIC METABOLIC PANEL WITH GFR
Anion gap: 15 (ref 5–15)
BUN: 17 mg/dL (ref 8–23)
CO2: 26 mmol/L (ref 22–32)
Calcium: 9.9 mg/dL (ref 8.9–10.3)
Chloride: 91 mmol/L — ABNORMAL LOW (ref 98–111)
Creatinine, Ser: 6.17 mg/dL — ABNORMAL HIGH (ref 0.44–1.00)
GFR, Estimated: 7 mL/min — ABNORMAL LOW (ref >60)
Glucose, Bld: 98 mg/dL (ref 70–99)
Potassium: 3.8 mmol/L (ref 3.5–5.1)
Sodium: 132 mmol/L — ABNORMAL LOW (ref 135–145)

## 2024-05-18 LAB — GLUCOSE, CAPILLARY: Glucose-Capillary: 120 mg/dL — ABNORMAL HIGH (ref 70–99)

## 2024-05-18 NOTE — Care Management Obs Status (Signed)
 MEDICARE OBSERVATION STATUS NOTIFICATION   Patient Details  Name: [name] MRN: [id number] Date of Birth: Jul 26, 1956   Medicare Observation Status Notification Given:  Yes    Robynn Eileen Hoose, RN 05/18/2024, 9:10 AM

## 2024-05-18 NOTE — Progress Notes (Signed)
 PROGRESS NOTE  [name]  DOB: 1956/05/13  PCP: Celestia Rosaline SQUIBB, NP FMW:[id number]  DOA: 05/16/2024  LOS: 0 days  Hospital Day: 3  Brief narrative: [name] is a 68 y.o. female with PMH significant for ESRD HD TTS, morbid obesity, OSA, DM2, HLD, permanent A-fib on Eliquis  s/p maze procedure/prior DCCV's and AV nodal ablation 04/23/2024 s/p leadless PPM, s/p mitral and tricuspid valve repair, CHF, chronic hypotension on midodrine  CKD, GERD, h/o DVT, anemia, arthritis, anxiety/depression, gastroparesis. 9/26, patient presented to the ED for evaluation of abdominal pain, nausea, vomiting, poor oral intake for 1 week.   Last dialysis on the schedule was the day prior on 9/25.  In the ED, afebrile, hemodynamically stable CBC unremarkable, BMP with creatinine 8.14 CT abdomen/pelvis without contrast negative for acute findings.  Stable calcified left renal artery aneurysms, stable mild cardiomegaly, aortic atherosclerosis, and stable compression fractures of the thoracolumbar spine noted. Patient was given IV Dilaudid  1 mg x 3, IV Ativan  1 mg, IV Zofran  x 2.   Patient failed p.o. challenge x 2.   Admitted to [facility]  Subjective: Patient was seen and examined this morning Lying down in bed. Continues to complain of persistent nausea.  Reported 1 episode of vomiting this morning. Says she could not drink much of CLD but wants to advance to soft diet Afebrile, hemodynamically stable with blood pressure mostly in 200s, breathing on room air Repeat labs this morning with sodium 132  Assessment and plan: Intractable abdominal pain, nausea and vomiting H/o gastroparesis Presented with 1 week of abdominal pain, nausea, vomiting, poor intake  CT abdomen and pelvis negative for acute pathology. Suspect acute flareup of gastroparesis She has a history of QT prolongation. Currently on trial IM Tigan  200 mg q6h prn Continue IV Protonix  40 mg twice daily Says she could not  drink much of CLD but wants to advance to soft diet. Not on any opioids at this time.   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  PTA meds- continue Eliquis .   ESRD on TTS HD: Last dialysis on the schedule was yesterday 9/27.   Chronic HFpEF S/p MV and TV repair: Volume status is stable and managed with HD.   COPD Stable.  Continue albuterol  as needed.   Type 2 diabetes: Well-controlled with last A1c 5.0%.   Chronic hypotension: Continue midodrine .   H/o DVT: Continue Eliquis .   Hyperlipidemia: Continue rosuvastatin .   Depression/anxiety: Continue BuSpar  and Cymbalta .   OSA: Hold CPAP tonight given active nausea/vomiting.   Mobility: Encourage ambulation PT Orders:   PT Follow up Rec:    Goals of care   Code Status: Full Code     DVT prophylaxis:   apixaban  (ELIQUIS ) tablet 5 mg   Antimicrobials: None Fluid: None Consultants: Nephrology Family Communication: None at bedside  Status: Observation Level of care:  Telemetry Medical   Patient is from: Home Needs to continue in-hospital care: Continues to have nausea, vomiting, inability tolerate oral intake Anticipated d/c to: Pending clinical course      Diet:  Diet Order             Diet clear liquid Room service appropriate? Yes; Fluid consistency: Thin  Diet effective now                   Scheduled Meds:  apixaban   5 mg Oral BID   busPIRone   5 mg Oral BID   DULoxetine   60  mg Oral Daily   midodrine   10 mg Oral Daily   pantoprazole  (PROTONIX ) IV  40 mg Intravenous Q12H   rosuvastatin   10 mg Oral Daily   sodium chloride  flush  3 mL Intravenous Q12H    PRN meds: acetaminophen  **OR** acetaminophen , albuterol , diphenhydrAMINE , diphenhydrAMINE -zinc  acetate, senna-docusate, trimethobenzamide    Infusions:     Antimicrobials: Anti-infectives (From admission, onward)    None       Objective: Vitals:    05/17/24 2018 05/18/24 0448  BP: 102/60 (!) 119/51  Pulse: 73 72  Resp:  20  Temp: 98.2 F (36.8 C) 98.2 F (36.8 C)  SpO2: 99% 100%    Intake/Output Summary (Last 24 hours) at 05/18/2024 0937 Last data filed at 05/17/2024 2100 Gross per 24 hour  Intake 50 ml  Output 1000 ml  Net -950 ml   Filed Weights   05/17/24 1148 05/17/24 1539 05/18/24 0448  Weight: 119.5 kg 118.5 kg 120.1 kg   Weight change: -1.157 kg Body mass index is 41.47 kg/m.   Physical Exam: General exam: Pleasant, elderly African-American female Skin: No rashes, lesions or ulcers. HEENT: Atraumatic, normocephalic, no obvious bleeding Lungs: Clear to auscultation bilaterally,  CVS: S1, S2, no murmur,   GI/Abd: Soft, very minimal epigastric tenderness, nondistended, bowel sound present,   CNS: Alert, awake, oriented x 3 Psychiatry: Sad affect Extremities: No pedal edema, no calf tenderness,   Data Review: I have personally reviewed the laboratory data and studies available.  F/u labs ordered Unresulted Labs (From admission, onward)     Start     Ordered   05/17/24 0827  Hepatitis B surface antibody,quantitative  (New Admission Hemo Labs (Hepatitis B))  Once,   R       Question:  Specimen collection method  Answer:  Lab=Lab collect   05/17/24 0827            Signed, Chapman Rota, MD [facility] 05/18/2024

## 2024-05-18 NOTE — Plan of Care (Signed)
   Problem: Health Behavior/Discharge Planning: Goal: Ability to manage health-related needs will improve Outcome: Progressing

## 2024-05-18 NOTE — Progress Notes (Signed)
  [facility] Progress Note   Subjective:   Seen in room. Reports abd pain/nausea the same. S/p HD yesterday with 1L off.  Objective Vitals:   05/17/24 1542 05/17/24 2018 05/18/24 0448 05/18/24 0943  BP: 119/67 102/60 (!) 119/51 (!) 111/56  Pulse: 81 73 72 (!) 55  Resp: 18  20 16   Temp:  98.2 F (36.8 C) 98.2 F (36.8 C) 98 F (36.7 C)  TempSrc:  Oral Oral Oral  SpO2: 96% 99% 100% 100%  Weight:   120.1 kg   Height:       Physical Exam General: Well developed, NAD Heart: RRR Lungs: CTA anteriorly Abdomen: soft Extremities: no LE edema Dialysis Access:  LUE AVF +t/b  Additional Objective Labs: Basic Metabolic Panel: Recent Labs  Lab 05/16/24 1334 05/17/24 0308 05/18/24 0408  NA 135 136 132*  K 4.3 4.1 3.8  CL 93* 92* 91*  CO2 27 25 26   GLUCOSE 87 93 98  BUN 22 26* 17  CREATININE 8.14* 9.23* 6.17*  CALCIUM  10.0 10.4* 9.9   Liver Function Tests: Recent Labs  Lab 05/13/24 0727 05/16/24 1334  AST 17 22  ALT 9 14  ALKPHOS 94 100  BILITOT 0.7 1.1  PROT 7.7 8.4*  ALBUMIN  3.5 3.9   Recent Labs  Lab 05/13/24 0727 05/16/24 1334  LIPASE 15 14   CBC: Recent Labs  Lab 05/13/24 0727 05/16/24 1334 05/17/24 0308  WBC 4.5 5.0 5.8  NEUTROABS 2.8  --   --   HGB 11.7* 12.7 12.8  HCT 36.7 39.4 40.0  MCV 97.6 95.2 95.7  PLT 114* 163 171   Studies/Results: CT ABDOMEN PELVIS WO CONTRAST Result Date: 05/16/2024 CLINICAL DATA:  Abdominal pain. EXAM: CT ABDOMEN AND PELVIS WITHOUT CONTRAST TECHNIQUE: Multidetector CT imaging of the abdomen and pelvis was performed following the standard protocol without IV contrast. RADIATION DOSE REDUCTION: This exam was performed according to the departmental dose-optimization program which includes automated exposure control, adjustment of the mA and/or kV according to patient size and/or use of iterative reconstruction technique. COMPARISON:  04/22/2024 and 04/19/2024 FINDINGS: Lower chest: Mild stable  cardiomegaly. Median sternotomy. Postsurgical changes over the heart including metallic density unchanged over the ventricular septum as well as evidence of previous tricuspid and mitral valve repairs. Lung bases demonstrate mild linear scarring over the left base and are otherwise clear. Hepatobiliary: Previous cholecystectomy. The the liver and biliary tree otherwise unremarkable. The Pancreas: Normal. Spleen: Normal. Adrenals/Urinary Tract: Adrenal glands are normal. The kidneys are normal in size without hydronephrosis or nephrolithiasis. Ureters are normal. Bladder is nondistended. Stomach/Bowel: Possible small sliding hiatal hernia. Stomach is otherwise unremarkable. Small bowel is normal. Appendix is normal. Colon is normal. Vascular/Lymphatic: Mild calcified plaque over the abdominal aorta which is normal in caliber. Two adjacent calcified stable left renal artery aneurysms the adjacent the renal hilum. The small nodes over the gastrohepatic ligament and porta hepatis unchanged. Reproductive: Uterus and bilateral adnexa are unremarkable. Other: No free fluid or focal inflammatory change. Musculoskeletal: No change in moderate T11 compression fracture. Mild compression deformities of L3-L5 unchanged. IMPRESSION: 1. No acute findings in the abdomen/pelvis. 2. Stable calcified left renal artery aneurysms. 3. Stable mild cardiomegaly. 4. Aortic atherosclerosis. 5. Stable compression fractures of the thoracolumbar spine. Aortic Atherosclerosis (ICD10-I70.0). Electronically Signed   By: Toribio Agreste M.D.   On: 05/16/2024 14:58   Medications:   apixaban   5 mg Oral BID   busPIRone   5 mg Oral BID   DULoxetine   60  mg Oral Daily   midodrine   10 mg Oral Daily   pantoprazole  (PROTONIX ) IV  40 mg Intravenous Q12H   rosuvastatin   10 mg Oral Daily   sodium chloride  flush  3 mL Intravenous Q12H    Dialysis Orders TTS - AF 4hr, 400/600, EDW 123.2kg, 2K/2Ca bath, AVF, heparin  2500 unit bolus - Last HD 9/25,  post-wt 119.3kg - ESA on hold, last Hgb 13 - Hectoral 2mcg IV q HD - Sensipar 30mg  PO q HD - just started 9/25   Assessment/Plan:  Abd pain with N/V: Presumed gastroparesis, abd CT negative. On PPI BID , s/p trial IM Tigan  q 6 hr.  Per primary.  ESRD:  Continue HD on usual TTS schedule - next HD Tues 9/30.  Hypotension/volume: BP stable, on midodrine .  Anemia: Hgb > 11, no ESA needed.  Metabolic bone disease: Ca high, Phos pending. Hold VDRA and sensipar (common for GI issues) for now, ok to continue binders once eating.  A-flutter s/p recent nodal ablation, has PPM   Izetta Boehringer, PA-C 05/18/2024, 10:01 AM  [employer]

## 2024-05-18 NOTE — Plan of Care (Signed)

## 2024-05-19 ENCOUNTER — Ambulatory Visit: Admitting: Family Medicine

## 2024-05-19 DIAGNOSIS — R112 Nausea with vomiting, unspecified: Secondary | ICD-10-CM | POA: Diagnosis not present

## 2024-05-19 MED ORDER — ONDANSETRON HCL 4 MG/2ML IJ SOLN
4.0000 mg | Freq: Four times a day (QID) | INTRAMUSCULAR | Status: DC | PRN
  Administered 2024-05-19 – 2024-05-20 (×2): 4 mg via INTRAVENOUS
  Filled 2024-05-19 (×2): qty 2

## 2024-05-19 NOTE — Progress Notes (Signed)
 Nurse called into room. Pt states that she doesn't know if she is coming or going due to her stomach and nausea.  Took 5pm medication with water. No vomiting.

## 2024-05-19 NOTE — Progress Notes (Signed)
 PROGRESS NOTE  Donn Wilmot [name]  DOB: 1955-11-15  PCP: Celestia Rosaline SQUIBB, NP FMW:[id number]  DOA: 05/16/2024  LOS: 1 day  Hospital Day: 4  Brief narrative: [name] is a 68 y.o. female with PMH significant for ESRD HD TTS, morbid obesity, OSA, DM2, HLD, permanent A-fib on Eliquis  s/p maze procedure/prior DCCV's and AV nodal ablation 04/23/2024 s/p leadless PPM, s/p mitral and tricuspid valve repair, CHF, chronic hypotension on midodrine  CKD, GERD, h/o DVT, anemia, arthritis, anxiety/depression, gastroparesis. 9/26, patient presented to the ED for evaluation of abdominal pain, nausea, vomiting, poor oral intake for 1 week.   Last dialysis on the schedule was the day prior on 9/25.  In the ED, afebrile, hemodynamically stable CBC unremarkable, BMP with creatinine 8.14 CT abdomen/pelvis without contrast negative for acute findings.  Stable calcified left renal artery aneurysms, stable mild cardiomegaly, aortic atherosclerosis, and stable compression fractures of the thoracolumbar spine noted. Patient was given IV Dilaudid  1 mg x 3, IV Ativan  1 mg, IV Zofran  x 2.   Patient failed p.o. challenge x 2.   Admitted to [facility]  Subjective: Patient was seen and examined this morning.  Lunch in front of her. She states she has been barely able to eat.  Also reports persistent vomiting. Not sure how much of her symptoms are real.  I have asked nursing staff to keep a close eye on her symptoms and intake. Afebrile, hemodynamically stable, breathing on room air   Assessment and plan: Intractable abdominal pain, nausea and vomiting H/o gastroparesis Presented with 1 week of abdominal pain, nausea, vomiting, poor intake  CT abdomen and pelvis negative for acute pathology. Suspect acute flareup of gastroparesis She has a history of QT prolongation. Currently on trial IM Tigan  200 mg q6h prn Continue IV Protonix  40 mg twice daily I had started her on soft diet yesterday. She states  she has been barely able to eat.  Also reports persistent vomiting. Not sure how much of her symptoms are real.  I have asked nursing staff to keep a close eye on her symptoms and intake.   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  PTA meds- continue Eliquis .   ESRD on TTS HD: Last dialysis on the schedule was yesterday 9/27.   Chronic HFpEF S/p MV and TV repair: Volume status is stable and managed with HD.   COPD Stable.  Continue albuterol  as needed.   Type 2 diabetes: Well-controlled with last A1c 5.0%.   Chronic hypotension: Continue midodrine .   H/o DVT: Continue Eliquis .   Hyperlipidemia: Continue rosuvastatin .   Depression/anxiety: Continue BuSpar  and Cymbalta .   OSA: Hold CPAP because of concern of nausea, vomiting   Mobility: Encourage ambulation PT Orders:   PT Follow up Rec:    Goals of care   Code Status: Full Code     DVT prophylaxis:   apixaban  (ELIQUIS ) tablet 5 mg   Antimicrobials: None Fluid: None Consultants: Nephrology Family Communication: None at bedside  Status: Observation Level of care:  Telemetry Medical   Patient is from: Home Needs to continue in-hospital care: Continues to have nausea, vomiting, inability tolerate oral intake Anticipated d/c to: Pending clinical course      Diet:  Diet Order             DIET SOFT Room service appropriate? Yes; Fluid consistency: Thin  Diet effective now  Scheduled Meds:  apixaban   5 mg Oral BID   busPIRone   5 mg Oral BID   DULoxetine   60 mg Oral Daily   midodrine   10 mg Oral Daily   pantoprazole  (PROTONIX ) IV  40 mg Intravenous Q12H   rosuvastatin   10 mg Oral Daily   sodium chloride  flush  3 mL Intravenous Q12H    PRN meds: acetaminophen  **OR** acetaminophen , albuterol , diphenhydrAMINE , diphenhydrAMINE -zinc  acetate, senna-docusate, trimethobenzamide    Infusions:      Antimicrobials: Anti-infectives (From admission, onward)    None       Objective: Vitals:   05/19/24 0528 05/19/24 0859  BP: (!) 122/59 129/69  Pulse:  63  Resp:  18  Temp: 98.2 F (36.8 C) 98 F (36.7 C)  SpO2: 100% 100%    Intake/Output Summary (Last 24 hours) at 05/19/2024 1305 Last data filed at 05/19/2024 0900 Gross per 24 hour  Intake 380 ml  Output 0 ml  Net 380 ml   Filed Weights   05/17/24 1539 05/18/24 0448 05/19/24 0528  Weight: 118.5 kg 120.1 kg 120.3 kg   Weight change: 0.8 kg Body mass index is 41.54 kg/m.   Physical Exam: General exam: Pleasant, elderly African-American female Skin: No rashes, lesions or ulcers. HEENT: Atraumatic, normocephalic, no obvious bleeding Lungs: Clear to auscultation bilaterally,  CVS: S1, S2, no murmur,   GI/Abd: Soft, very mild epigastric tenderness, nondistended, bowel sound present,   CNS: Alert, awake, oriented x 3 Psychiatry: Sad affect Extremities: No pedal edema, no calf tenderness,   Data Review: I have personally reviewed the laboratory data and studies available.  F/u labs ordered Unresulted Labs (From admission, onward)     Start     Ordered   05/17/24 0827  Hepatitis B surface antibody,quantitative  (New Admission Hemo Labs (Hepatitis B))  Once,   R       Question:  Specimen collection method  Answer:  Lab=Lab collect   05/17/24 0827   Signed and Held  Renal function panel  Tomorrow morning,   R       Question:  Specimen collection method  Answer:  Lab=Lab collect   Signed and Held   Signed and Held  CBC  Tomorrow morning,   R       Question:  Specimen collection method  Answer:  Lab=Lab collect   Signed and Held            Signed, Chapman Rota, MD [facility] 05/19/2024

## 2024-05-19 NOTE — Progress Notes (Signed)
 Writer went in and checked on pt. Stated that she ate only 25% of lunch tray and that she was not having no symptoms of N /V.

## 2024-05-19 NOTE — Progress Notes (Signed)
 Pt receives out-pt HD at [facility] [place] on TTS 6:45 am chair time. Will assist as needed.   Randine Mungo Dialysis Navigator [phone]

## 2024-05-19 NOTE — Progress Notes (Signed)
  [facility] Progress Note   Subjective:   Seen in room - ongoing abd pain, N/V. Able to keep down a little of her food. For HD tomorrow.  Objective Vitals:   05/18/24 1645 05/18/24 1931 05/19/24 0528 05/19/24 0859  BP: 137/69 103/62 (!) 122/59 129/69  Pulse: 64 65  63  Resp: 20 16  18   Temp: 98.3 F (36.8 C) 97.7 F (36.5 C) 98.2 F (36.8 C) 98 F (36.7 C)  TempSrc: Oral Oral Oral   SpO2: 100% 94% 100% 100%  Weight:   120.3 kg   Height:       Physical Exam General: Well developed, NAD Heart: RRR Lungs: CTA anteriorly Abdomen: soft, no guarding Extremities: no LE edema Dialysis Access:  LUE AVF +t/b  Additional Objective Labs: Basic Metabolic Panel: Recent Labs  Lab 05/16/24 1334 05/17/24 0308 05/18/24 0408  NA 135 136 132*  K 4.3 4.1 3.8  CL 93* 92* 91*  CO2 27 25 26   GLUCOSE 87 93 98  BUN 22 26* 17  CREATININE 8.14* 9.23* 6.17*  CALCIUM  10.0 10.4* 9.9   Liver Function Tests: Recent Labs  Lab 05/13/24 0727 05/16/24 1334  AST 17 22  ALT 9 14  ALKPHOS 94 100  BILITOT 0.7 1.1  PROT 7.7 8.4*  ALBUMIN  3.5 3.9   Recent Labs  Lab 05/13/24 0727 05/16/24 1334  LIPASE 15 14   CBC: Recent Labs  Lab 05/13/24 0727 05/16/24 1334 05/17/24 0308  WBC 4.5 5.0 5.8  NEUTROABS 2.8  --   --   HGB 11.7* 12.7 12.8  HCT 36.7 39.4 40.0  MCV 97.6 95.2 95.7  PLT 114* 163 171   Medications:   apixaban   5 mg Oral BID   busPIRone   5 mg Oral BID   DULoxetine   60 mg Oral Daily   midodrine   10 mg Oral Daily   pantoprazole  (PROTONIX ) IV  40 mg Intravenous Q12H   rosuvastatin   10 mg Oral Daily   sodium chloride  flush  3 mL Intravenous Q12H    Dialysis Orders TTS - AF 4hr, 400/600, EDW 123.2kg, 2K/2Ca bath, AVF, heparin  2500 unit bolus - Last HD 9/25, post-wt 119.3kg - ESA on hold, last Hgb 13 - Hectoral 2mcg IV q HD - Sensipar 30mg  PO q HD - just started 9/25   Assessment/Plan:  Abd pain with N/V: Presumed gastroparesis, abd CT negative.  On PPI BID , s/p trial IM Tigan  q 6 hr.  Per primary.  ESRD:  Continue HD on usual TTS schedule - next HD Tues 9/30.  Hypotension/volume: BP stable, on midodrine .  Anemia: Hgb > 11, no ESA needed.  Metabolic bone disease: Ca high, Phos pending. Hold VDRA and sensipar (common for GI issues) for now, ok to continue binders once eating.  A-flutter s/p recent nodal ablation, has PPM   Leslie Boehringer, PA-C 05/19/2024, 12:11 PM  [employer]

## 2024-05-19 NOTE — Plan of Care (Signed)

## 2024-05-20 DIAGNOSIS — Z992 Dependence on renal dialysis: Secondary | ICD-10-CM | POA: Diagnosis not present

## 2024-05-20 DIAGNOSIS — N186 End stage renal disease: Secondary | ICD-10-CM | POA: Diagnosis not present

## 2024-05-20 DIAGNOSIS — E1122 Type 2 diabetes mellitus with diabetic chronic kidney disease: Secondary | ICD-10-CM | POA: Diagnosis not present

## 2024-05-20 DIAGNOSIS — R112 Nausea with vomiting, unspecified: Secondary | ICD-10-CM | POA: Diagnosis not present

## 2024-05-20 LAB — CBC
HCT: 36.7 % (ref 36.0–46.0)
Hemoglobin: 11.9 g/dL — ABNORMAL LOW (ref 12.0–15.0)
MCH: 30.2 pg (ref 26.0–34.0)
MCHC: 32.4 g/dL (ref 30.0–36.0)
MCV: 93.1 fL (ref 80.0–100.0)
Platelets: 163 K/uL (ref 150–400)
RBC: 3.94 MIL/uL (ref 3.87–5.11)
RDW: 14 % (ref 11.5–15.5)
WBC: 5.5 K/uL (ref 4.0–10.5)
nRBC: 0 % (ref 0.0–0.2)

## 2024-05-20 LAB — RENAL FUNCTION PANEL
Albumin: 3.5 g/dL (ref 3.5–5.0)
Anion gap: 15 (ref 5–15)
BUN: 38 mg/dL — ABNORMAL HIGH (ref 8–23)
CO2: 26 mmol/L (ref 22–32)
Calcium: 9.6 mg/dL (ref 8.9–10.3)
Chloride: 92 mmol/L — ABNORMAL LOW (ref 98–111)
Creatinine, Ser: 11.51 mg/dL — ABNORMAL HIGH (ref 0.44–1.00)
GFR, Estimated: 3 mL/min — ABNORMAL LOW (ref >60)
Glucose, Bld: 119 mg/dL — ABNORMAL HIGH (ref 70–99)
Phosphorus: 5 mg/dL — ABNORMAL HIGH (ref 2.5–4.6)
Potassium: 4.2 mmol/L (ref 3.5–5.1)
Sodium: 133 mmol/L — ABNORMAL LOW (ref 135–145)

## 2024-05-20 LAB — HEPATITIS B SURFACE ANTIBODY, QUANTITATIVE: Hep B S AB Quant (Post): 51.7 m[IU]/mL

## 2024-05-20 MED ORDER — ONDANSETRON HCL 4 MG/2ML IJ SOLN
INTRAMUSCULAR | Status: AC
  Filled 2024-05-20: qty 2

## 2024-05-20 MED ORDER — PENTAFLUOROPROP-TETRAFLUOROETH EX AERO: 1.0000 | INHALATION_SPRAY | CUTANEOUS | Status: DC | PRN

## 2024-05-20 MED ORDER — ALTEPLASE 2 MG IJ SOLR: 2.0000 mg | Freq: Once | INTRAMUSCULAR | Status: DC | PRN

## 2024-05-20 MED ORDER — ONDANSETRON HCL 4 MG PO TABS: 4.0000 mg | ORAL_TABLET | Freq: Three times a day (TID) | ORAL | 0 refills | Status: AC | PRN

## 2024-05-20 MED ORDER — LIDOCAINE HCL (PF) 1 % IJ SOLN: 5.0000 mL | INTRAMUSCULAR | Status: DC | PRN

## 2024-05-20 MED ORDER — ANTICOAGULANT SODIUM CITRATE 4% (200MG/5ML) IV SOLN: 5.0000 mL | Status: DC | PRN

## 2024-05-20 MED ORDER — HEPARIN SODIUM (PORCINE) 1000 UNIT/ML DIALYSIS
2000.0000 [IU] | Freq: Once | INTRAMUSCULAR | Status: AC
  Administered 2024-05-20: 2000 [IU] via INTRAVENOUS_CENTRAL

## 2024-05-20 MED ORDER — LIDOCAINE-PRILOCAINE 2.5-2.5 % EX CREA: 1.0000 | TOPICAL_CREAM | CUTANEOUS | Status: DC | PRN

## 2024-05-20 MED ORDER — HEPARIN SODIUM (PORCINE) 1000 UNIT/ML DIALYSIS: 1000.0000 [IU] | INTRAMUSCULAR | Status: DC | PRN

## 2024-05-20 MED ORDER — HEPARIN SODIUM (PORCINE) 1000 UNIT/ML IJ SOLN
INTRAMUSCULAR | Status: AC
  Filled 2024-05-20: qty 2

## 2024-05-20 NOTE — Progress Notes (Addendum)
 Received patient in bed.Alert and oriented x 4.Consent verified.  Access used: Left arm avf that worked well.  Duration of treatment : 3 hours.  Uf goal:  Met 1.5 L,tolerated tx.  Medicines given : Heparin  2,000 unit pre-run dose.                              Zofran  4 mg IV.  Hand off to the patient's nurse,back into hier room with stable condition via transporter.

## 2024-05-20 NOTE — Discharge Planning (Signed)
 [facility] Patient Discharge Orders - [facility] CLINIC: [facility]  Patient's name: [name] Admit/DC Dates: 05/16/2024 - 05/20/24  DISCHARGE DIAGNOSES: Intractable N/V with abd pain - presumed gastroparesis     HD ORDER CHANGES: Heparin  change: no  EDW Change: YES New EDW: 120kg. ?possibly less - not eating much this admit Bath Change: no  ANEMIA MANAGEMENT: Aranesp : Given: no   ESA dose for discharge:  Restart protocol once Hgb < 11 IV Iron  dose at discharge: Per protocol Transfusion: Given: no  BONE/MINERAL MEDICATIONS: Hectorol /Calcitriol change: no Sensipar/Parsabiv change: no  **but consider if sensipar exacerbating her GI issues? May need to hold  ACCESS INTERVENTION/CHANGE: no Details:   RECENT LABS: Recent Labs  Lab 05/20/24 1138  HGB 11.9*  NA 133*  K 4.2  CALCIUM  9.6  PHOS 5.0*  ALBUMIN  3.5    IV ANTIBIOTICS: no Details:  OTHER ANTICOAGULATION: On Eliquis  Details:  OTHER/APPTS/LAB ORDERS: - Hectoral held this admit - ok to resume on discharge  D/C Meds to be reconciled by nurse after every discharge.  Completed By: Izetta Boehringer, PA-C [facility] Pager [phone]   Reviewed by: MD:______ RN_______

## 2024-05-20 NOTE — Discharge Summary (Signed)
 Physician Discharge Summary  [name] FMW:[id number] DOB: 11-Apr-1956 DOA: 05/16/2024  PCP: Celestia Rosaline SQUIBB, NP  Admit date: 05/16/2024 Discharge date: 05/20/2024  Admitted from: Home Discharge disposition: Home  Recommendations at discharge:  Use as needed Zofran  for nausea, vomiting.. Recommend to continue soft diet for next few days and gradually advance to regular diet.     Brief narrative: [name] is a 68 y.o. female with PMH significant for ESRD HD TTS, morbid obesity, OSA, DM2, HLD, permanent A-fib on Eliquis  s/p maze procedure/prior DCCV's and AV nodal ablation 04/23/2024 s/p leadless PPM, s/p mitral and tricuspid valve repair, CHF, chronic hypotension on midodrine  CKD, GERD, h/o DVT, anemia, arthritis, anxiety/depression, gastroparesis. 9/26, patient presented to the ED for evaluation of abdominal pain, nausea, vomiting, poor oral intake for 1 week.   Last dialysis on the schedule was the day prior on 9/25.  In the ED, afebrile, hemodynamically stable CBC unremarkable, BMP with creatinine 8.14 CT abdomen/pelvis without contrast negative for acute findings.  Stable calcified left renal artery aneurysms, stable mild cardiomegaly, aortic atherosclerosis, and stable compression fractures of the thoracolumbar spine noted. Patient was given IV Dilaudid  1 mg x 3, IV Ativan  1 mg, IV Zofran  x 2.   Patient failed p.o. challenge x 2.   Admitted to [facility]  Subjective: Patient was seen and examined this morning. Lying on bed.  Not in distress. Afebrile, hemodynamically stable, breathing on room air In the last 24 hours, nursing staff monitored frequently for any symptoms of nausea, vomiting.  Apparently none.  Patient states she is still nauseous. Tolerating soft diet. Reported 2 bowel movement in last 24 hours.  Hospital course: Intractable abdominal pain, nausea and vomiting H/o gastroparesis Presented with 1 week of abdominal pain, nausea, vomiting,  poor intake  CT abdomen and pelvis negative for acute pathology. She was managed for acute flareup of gastroparesis with antiemetics, IV fluid, IV Protonix . Symptoms gradually improved.  Able to tolerate soft diet. I will discharge her on as needed Zofran .  Recommend to continue soft diet for next few days and gradually advance to regular diet.   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  PTA meds- continue Eliquis .   ESRD-HD-TTS On dialysis per schedule today   Chronic HFpEF S/p MV and TV repair: Volume status is stable and managed with HD.   COPD Stable.  Continue albuterol  as needed.   Type 2 diabetes: Well-controlled with last A1c 5.0%.   Chronic hypotension: Continue midodrine .   H/o DVT: Continue Eliquis .   Hyperlipidemia: Continue rosuvastatin .   Depression/anxiety: Continue BuSpar  and Cymbalta .   OSA: Nightly CPAP   Mobility: Encourage ambulation  Goals of care   Code Status: Full Code   Diet:  Diet Order             Diet general           DIET SOFT Room service appropriate? Yes; Fluid consistency: Thin  Diet effective now                   Nutritional status:  Body mass index is 41.54 kg/m.       Wounds:  -    Discharge Medications:   Allergies as of 05/20/2024       Reactions   Bee Venom Anaphylaxis   Cefuroxime Itching, Nausea And Vomiting   Sulfa Antibiotics Itching, Rash   Chlorhexidine     Unknown rxn.   Iodinated Contrast  Media Nausea And Vomiting        Medication List     TAKE these medications    acetaminophen  325 MG tablet Commonly known as: TYLENOL  Take 2 tablets (650 mg total) by mouth every 6 (six) hours as needed for mild pain or moderate pain. What changed: when to take this   albuterol  108 (90 Base) MCG/ACT inhaler Commonly known as: Proventil  HFA Inhale 1-2 puffs into the lungs every 6 (six) hours as needed for wheezing or  shortness of breath.   Auryxia  1 GM 210 MG(Fe) tablet Generic drug: ferric citrate  Take 210 mg by mouth 3 (three) times daily.   busPIRone  5 MG tablet Commonly known as: BUSPAR  TAKE ONE TABLET BY MOUTH TWICE DAILY @9AM -5PM What changed: See the new instructions.   diphenhydrAMINE  25 MG tablet Commonly known as: BENADRYL  Take 25 mg by mouth every 6 (six) hours as needed for itching. What changed: when to take this   doxercalciferol  4 MCG/2ML injection Commonly known as: HECTOROL  Inject 1.5 mLs (3 mcg total) into the vein Every Tuesday,Thursday,and Saturday with dialysis.   DULoxetine  60 MG capsule Commonly known as: CYMBALTA  TAKE ONE CAPSULE (60MG  TOTAL) BY MOUTH DAILY AT 9AM   Eliquis  5 MG Tabs tablet Generic drug: apixaban  TAKE ONE TABLET (5MG  TOTAL) BY MOUTH TWICE DAILY @9AM -5PM   EPINEPHrine  0.3 mg/0.3 mL Soaj injection Commonly known as: EPI-PEN Inject 0.3 mg into the muscle as needed for anaphylaxis.   Flonase  Allergy Relief 50 MCG/ACT nasal spray Generic drug: fluticasone  Place 2 sprays into both nostrils daily as needed for allergies. What changed: when to take this   gabapentin  100 MG capsule Commonly known as: NEURONTIN  Take 1 capsule (100 mg total) by mouth every Tuesday, Thursday, and Saturday at 6 PM.   Geri-kot 8.6 MG tablet Generic drug: senna Take 1 tablet (8.6 mg total) by mouth 2 (two) times daily. What changed:  when to take this reasons to take this   insulin  lispro 100 UNIT/ML KwikPen Commonly known as: HumaLOG  KwikPen For blood sugars 0-150 give 0 units of insulin , 151-200 give 2 units of insulin , 201-250 give 4 units, 251-300 give 6 units, 301-350 give 8 units, 351-400 give 10 units,> 400 give 12 units and call M.D. What changed:  how much to take how to take this when to take this additional instructions   lidocaine -prilocaine  cream Commonly known as: EMLA  Apply 1 Application topically Every Tuesday,Thursday,and Saturday with  dialysis.   loratadine  10 MG tablet Commonly known as: CLARITIN  TAKE ONE TABLET (10 mg total) BY MOUTH DAILY AT 9AM EVERY MORNING.   metoCLOPramide  5 MG tablet Commonly known as: Reglan  Take 1 tablet (5 mg total) by mouth every 8 (eight) hours as needed for nausea.   midodrine  10 MG tablet Commonly known as: PROAMATINE  Take 1 tablet (10 mg total) by mouth 2 (two) times daily. What changed:  when to take this additional instructions   MIRCERA IJ 50 mcg.   MULTIVITAMIN WOMEN PO Take 1 tablet by mouth daily.   nitroGLYCERIN  0.4 MG SL tablet Commonly known as: NITROSTAT  Place 1 tablet (0.4 mg total) under the tongue every 5 (five) minutes x 3 doses as needed for chest pain.   ondansetron  4 MG tablet Commonly known as: Zofran  Take 1 tablet (4 mg total) by mouth every 8 (eight) hours as needed for nausea or vomiting.   oxyCODONE -acetaminophen  5-325 MG tablet Commonly known as: PERCOCET/ROXICET Take 1 tablet by mouth every 8 (eight) hours as needed for  severe pain (pain score 7-10).   pantoprazole  40 MG tablet Commonly known as: PROTONIX  TAKE ONE TABLET BY MOUTH DAILY AT 9AM What changed: See the new instructions.   polyethylene glycol powder 17 GM/SCOOP powder Commonly known as: GLYCOLAX /MIRALAX  Dissolve 1 capful (17g) in 4-8 ounces of liquid and take by mouth daily as needed   rosuvastatin  10 MG tablet Commonly known as: CRESTOR  TAKE ONE TABLET BY MOUTH DAILY AT 5PM         Follow ups:    Follow-up Information     Celestia Rosaline SQUIBB, NP Follow up.   Specialty: Internal Medicine Contact information: [address] [place] [address] [phone]                 Discharge Instructions:   Discharge Instructions     Call MD for:  difficulty breathing, headache or visual disturbances   Complete by: As directed    Call MD for:  extreme fatigue   Complete by: As directed    Call MD for:  hives   Complete by: As directed    Call MD for:   persistant dizziness or light-headedness   Complete by: As directed    Call MD for:  persistant nausea and vomiting   Complete by: As directed    Call MD for:  severe uncontrolled pain   Complete by: As directed    Call MD for:  temperature >100.4   Complete by: As directed    Diet general   Complete by: As directed    Discharge instructions   Complete by: As directed    Recommendations at discharge:   Use as needed Zofran  for nausea, vomiting..  Recommend to continue soft diet for next few days and gradually advance to regular diet.  General discharge instructions: Follow with Primary MD Celestia Rosaline SQUIBB, NP in 7 days  Please request your PCP  to go over your hospital tests, procedures, radiology results at the follow up. Please get your medicines reviewed and adjusted.  Your PCP may decide to repeat certain labs or tests as needed. Do not drive, operate heavy machinery, perform activities at heights, swimming or participation in water activities or provide baby sitting services if your were admitted for syncope or siezures until you have seen by Primary MD or a Neurologist and advised to do so again. [place]  Controlled Substance Reporting System database was reviewed. Do not drive, operate heavy machinery, perform activities at heights, swim, participate in water activities or provide baby-sitting services while on medications for pain, sleep and mood until your outpatient physician has reevaluated you and advised to do so again.  You are strongly recommended to comply with the dose, frequency and duration of prescribed medications. Activity: As tolerated with Full fall precautions use walker/cane & assistance as needed Avoid using any recreational substances like cigarette, tobacco, alcohol , or non-prescribed drug. If you experience worsening of your admission symptoms, develop shortness of breath, life threatening emergency, suicidal or homicidal thoughts you must seek medical  attention immediately by calling 911 or calling your MD immediately  if symptoms less severe. You must read complete instructions/literature along with all the possible adverse reactions/side effects for all the medicines you take and that have been prescribed to you. Take any new medicine only after you have completely understood and accepted all the possible adverse reactions/side effects.  Wear Seat belts while driving. You were cared for by a hospitalist during your hospital stay. If you have any questions about your  discharge medications or the care you received while you were in the hospital after you are discharged, you can call the unit and ask to speak with the hospitalist or the covering physician. Once you are discharged, your primary care physician will handle any further medical issues. Please note that NO REFILLS for any discharge medications will be authorized once you are discharged, as it is imperative that you return to your primary care physician (or establish a relationship with a primary care physician if you do not have one).   Increase activity slowly   Complete by: As directed        Discharge Exam:   Vitals:   05/20/24 1310 05/20/24 1315 05/20/24 1329 05/20/24 1340  BP: 129/70 129/70 (!) 124/99 (!) 141/73  Pulse: 66  66 67  Resp: 13  15 19   Temp:      TempSrc:      SpO2: 99%  100% 100%  Weight:      Height:        Body mass index is 41.54 kg/m.  General exam: Pleasant, elderly African-American female Skin: No rashes, lesions or ulcers. HEENT: Atraumatic, normocephalic, no obvious bleeding Lungs: Clear to auscultation bilaterally,  CVS: S1, S2, no murmur,   GI/Abd: Soft, no epigastric tenderness, nondistended, bowel sound present,   CNS: Alert, awake, oriented x 3 Psychiatry: Appropriate Extremities: No pedal edema, no calf tenderness,    The results of significant diagnostics from this hospitalization (including imaging, microbiology, ancillary and  laboratory) are listed below for reference.    Procedures and Diagnostic Studies:   CT ABDOMEN PELVIS WO CONTRAST Result Date: 05/16/2024 CLINICAL DATA:  Abdominal pain. EXAM: CT ABDOMEN AND PELVIS WITHOUT CONTRAST TECHNIQUE: Multidetector CT imaging of the abdomen and pelvis was performed following the standard protocol without IV contrast. RADIATION DOSE REDUCTION: This exam was performed according to the departmental dose-optimization program which includes automated exposure control, adjustment of the mA and/or kV according to patient size and/or use of iterative reconstruction technique. COMPARISON:  04/22/2024 and 04/19/2024 FINDINGS: Lower chest: Mild stable cardiomegaly. Median sternotomy. Postsurgical changes over the heart including metallic density unchanged over the ventricular septum as well as evidence of previous tricuspid and mitral valve repairs. Lung bases demonstrate mild linear scarring over the left base and are otherwise clear. Hepatobiliary: Previous cholecystectomy. The the liver and biliary tree otherwise unremarkable. The Pancreas: Normal. Spleen: Normal. Adrenals/Urinary Tract: Adrenal glands are normal. The kidneys are normal in size without hydronephrosis or nephrolithiasis. Ureters are normal. Bladder is nondistended. Stomach/Bowel: Possible small sliding hiatal hernia. Stomach is otherwise unremarkable. Small bowel is normal. Appendix is normal. Colon is normal. Vascular/Lymphatic: Mild calcified plaque over the abdominal aorta which is normal in caliber. Two adjacent calcified stable left renal artery aneurysms the adjacent the renal hilum. The small nodes over the gastrohepatic ligament and porta hepatis unchanged. Reproductive: Uterus and bilateral adnexa are unremarkable. Other: No free fluid or focal inflammatory change. Musculoskeletal: No change in moderate T11 compression fracture. Mild compression deformities of L3-L5 unchanged. IMPRESSION: 1. No acute findings in the  abdomen/pelvis. 2. Stable calcified left renal artery aneurysms. 3. Stable mild cardiomegaly. 4. Aortic atherosclerosis. 5. Stable compression fractures of the thoracolumbar spine. Aortic Atherosclerosis (ICD10-I70.0). Electronically Signed   By: Toribio Agreste M.D.   On: 05/16/2024 14:58     Labs:   Basic Metabolic Panel: Recent Labs  Lab 05/16/24 1334 05/17/24 0308 05/18/24 0408 05/20/24 1138  NA 135 136 132* 133*  K 4.3 4.1 3.8  4.2  CL 93* 92* 91* 92*  CO2 27 25 26 26   GLUCOSE 87 93 98 119*  BUN 22 26* 17 38*  CREATININE 8.14* 9.23* 6.17* 11.51*  CALCIUM  10.0 10.4* 9.9 9.6  PHOS  --   --   --  5.0*   GFR Estimated Creatinine Clearance: 6.4 mL/min (A) (by C-G formula based on SCr of 11.51 mg/dL (H)). Liver Function Tests: Recent Labs  Lab 05/16/24 1334 05/20/24 1138  AST 22  --   ALT 14  --   ALKPHOS 100  --   BILITOT 1.1  --   PROT 8.4*  --   ALBUMIN  3.9 3.5   Recent Labs  Lab 05/16/24 1334  LIPASE 14   No results for input(s): AMMONIA in the last 168 hours. Coagulation profile No results for input(s): INR, PROTIME in the last 168 hours.  CBC: Recent Labs  Lab 05/16/24 1334 05/17/24 0308 05/20/24 1138  WBC 5.0 5.8 5.5  HGB 12.7 12.8 11.9*  HCT 39.4 40.0 36.7  MCV 95.2 95.7 93.1  PLT 163 171 163   Cardiac Enzymes: No results for input(s): CKTOTAL, CKMB, CKMBINDEX, TROPONINI in the last 168 hours. BNP: Invalid input(s): POCBNP CBG: Recent Labs  Lab 05/18/24 1955  GLUCAP 120*   D-Dimer No results for input(s): DDIMER in the last 72 hours. Hgb A1c No results for input(s): HGBA1C in the last 72 hours. Lipid Profile No results for input(s): CHOL, HDL, LDLCALC, TRIG, CHOLHDL, LDLDIRECT in the last 72 hours. Thyroid  function studies No results for input(s): TSH, T4TOTAL, T3FREE, THYROIDAB in the last 72 hours.  Invalid input(s): FREET3 Anemia work up No results for input(s): VITAMINB12, FOLATE,  FERRITIN, TIBC, IRON , RETICCTPCT in the last 72 hours. Microbiology No results found for this or any previous visit (from the past 240 hours).  Time coordinating discharge: 45 minutes  Signed: Daniela Siebers  [facility] 05/20/2024, 2:03 PM

## 2024-05-20 NOTE — TOC Transition Note (Signed)
 Transition of Care [facility]) - Discharge Note   Patient Details  Name: JUNITA KUBOTA MRN: [id number] Date of Birth: 20-May-1956  [facility] CM/SW Contact:  Tom-Johnson, Harvest Muskrat, RN Phone Number: 05/20/2024, 2:50 PM   Clinical Narrative:     Patient is scheduled for discharge today.  Readmission Risk Assessment done. Home health resumption of care info, hospital f/u and discharge instructions on AVS. Family to transport at discharge.  No further ICM needs noted.       Final next level of care: Home w Home Health Services Barriers to Discharge: Barriers Resolved   Patient Goals and CMS Choice Patient states their goals for this hospitalization and ongoing recovery are:: To return home CMS Medicare.gov Compare Post Acute Care list provided to:: Patient Choice offered to / list presented to : Patient      Discharge Placement                Patient to be transferred to facility by: Family      Discharge Plan and Services Additional resources added to the After Visit Summary for                  DME Arranged: N/A DME Agency: NA       HH Arranged: PT, RN HH Agency: [name] Date [facility] Agency Contacted: 05/20/24 Time HH Agency Contacted: 1440 Representative spoke with at [facility] Agency: [place]  Social Drivers of Health (SDOH) Interventions SDOH Screenings   Food Insecurity: No Food Insecurity (05/20/2024)  Housing: Low Risk  (05/20/2024)  Recent Concern: Housing - High Risk (04/22/2024)  Transportation Needs: No Transportation Needs (05/20/2024)  Utilities: Not At Risk (05/20/2024)  Alcohol  Screen: Low Risk  (06/04/2023)  Depression (PHQ2-9): Low Risk  (03/31/2024)  Financial Resource Strain: Low Risk  (11/19/2023)  Physical Activity: Insufficiently Active (06/04/2023)  Social Connections: Moderately Isolated (05/20/2024)  Stress: No Stress Concern Present (06/04/2023)  Tobacco Use: Low Risk  (05/16/2024)  Health Literacy:  Patient Declined (06/04/2023)     Readmission Risk Interventions    05/20/2024    2:48 PM 04/25/2024   10:14 AM 03/05/2024   12:22 PM  Readmission Risk Prevention Plan  Transportation Screening Complete Complete Complete  PCP or Specialist Appt within 3-5 Days   Complete  HRI or Home Care Consult   Complete  Social Work Consult for Recovery Care Planning/Counseling   Complete  Palliative Care Screening   Not Applicable  Medication Review Oceanographer) Referral to Pharmacy Complete Referral to Pharmacy  PCP or Specialist appointment within 3-5 days of discharge Complete Complete   HRI or Home Care Consult Complete Complete   SW Recovery Care/Counseling Consult Complete Complete   Palliative Care Screening Not Applicable Not Applicable   Skilled Nursing Facility Not Applicable Patient Refused

## 2024-05-20 NOTE — Progress Notes (Signed)
  [facility] Progress Note   Subjective:   Seen in room and then later on HD - tolerating. Ongoing pain, nausea, low appetite. No dyspnea.  Objective Vitals:   05/20/24 0352 05/20/24 0759 05/20/24 1242 05/20/24 1315  BP: (!) 119/58 132/62  129/70  Pulse: 65 69    Resp: 18     Temp:  97.6 F (36.4 C) 97.9 F (36.6 C)   TempSrc:  Oral Oral   SpO2: 100% 100%    Weight:      Height:       Physical Exam eneral: Well developed, NAD Heart: RRR Lungs: CTA anteriorly Abdomen: soft, no guarding Extremities: no LE edema Dialysis Access:  LUE AVF +t/b  Additional Objective Labs: Basic Metabolic Panel: Recent Labs  Lab 05/17/24 0308 05/18/24 0408 05/20/24 1138  NA 136 132* 133*  K 4.1 3.8 4.2  CL 92* 91* 92*  CO2 25 26 26   GLUCOSE 93 98 119*  BUN 26* 17 38*  CREATININE 9.23* 6.17* 11.51*  CALCIUM  10.4* 9.9 9.6  PHOS  --   --  5.0*   Liver Function Tests: Recent Labs  Lab 05/16/24 1334 05/20/24 1138  AST 22  --   ALT 14  --   ALKPHOS 100  --   BILITOT 1.1  --   PROT 8.4*  --   ALBUMIN  3.9 3.5   Recent Labs  Lab 05/16/24 1334  LIPASE 14   CBC: Recent Labs  Lab 05/16/24 1334 05/17/24 0308 05/20/24 1138  WBC 5.0 5.8 5.5  HGB 12.7 12.8 11.9*  HCT 39.4 40.0 36.7  MCV 95.2 95.7 93.1  PLT 163 171 163   Medications:  anticoagulant sodium citrate       apixaban   5 mg Oral BID   busPIRone   5 mg Oral BID   DULoxetine   60 mg Oral Daily   heparin   2,000 Units Dialysis Once in dialysis   midodrine   10 mg Oral Daily   pantoprazole  (PROTONIX ) IV  40 mg Intravenous Q12H   rosuvastatin   10 mg Oral Daily   sodium chloride  flush  3 mL Intravenous Q12H    Dialysis Orders TS - AF 4hr, 400/600, EDW 123.2kg, 2K/2Ca bath, AVF, heparin  2500 unit bolus - Last HD 9/25, post-wt 119.3kg - ESA on hold, last Hgb 13 - Hectoral 2mcg IV q HD - Sensipar 30mg  PO q HD - just started 9/25   Assessment/Plan:  Abd pain with N/V: Presumed gastroparesis, abd CT  negative. On PPI BID, s/p trial IM Tigan  q 6 hr.  Per primary.  ESRD:  Continue HD on usual TTS schedule -  HD now, low UFG - below prior EDW.  Hypotension/volume: BP stable, on midodrine .  Anemia: Hgb > 11, no ESA needed.  Metabolic bone disease: Ca high, Phos ok. Hold VDRA and sensipar (common for GI issues) for now, ok to continue binders once eating.  A-flutter s/p recent nodal ablation, has PPM Dispo: Ok from renal standpoint once cleared medically     Izetta Boehringer, PA-C 05/20/2024, 1:27 PM  [employer]

## 2024-05-20 NOTE — Progress Notes (Signed)
 D/C order noted. Providers felt it to be in pt's best interest to receive HD prior to d/c. Contacted [facility] to be advised of pt's today and that pt should resume care on Thursday.   Randine Mungo Dialysis Navigator [phone]

## 2024-05-21 ENCOUNTER — Telehealth: Payer: Self-pay | Admitting: Nephrology

## 2024-05-21 ENCOUNTER — Telehealth: Payer: Self-pay | Admitting: *Deleted

## 2024-05-21 NOTE — Telephone Encounter (Signed)
 Transition of care contact from inpatient facility  Date of Discharge: 05/20/24 Date of Contact: 05/21/24 Method of contact: Phone  Attempted to contact patient to discuss transition of care from inpatient admission. Patient did not answer the phone. Will continue outreach attempts.

## 2024-05-21 NOTE — Transitions of Care (Post Inpatient/ED Visit) (Signed)
   05/21/2024  Name: [name] MRN: [id number] DOB: 03-Jul-1956  Today's TOC FU Call Status: Today's TOC FU Call Status:: Unsuccessful Call (1st Attempt) Unsuccessful Call (1st Attempt) Date: 05/21/24  Attempted to reach the patient regarding the most recent Inpatient/ED visit.  Follow Up Plan: Additional outreach attempts will be made to reach the patient to complete the Transitions of Care (Post Inpatient/ED visit) call.   Andrea Dimes RN, BSN [place]  [facility] RN Care Manager [phone]

## 2024-05-22 ENCOUNTER — Telehealth: Payer: Self-pay | Admitting: *Deleted

## 2024-05-22 NOTE — Transitions of Care (Post Inpatient/ED Visit) (Signed)
   05/22/2024  Name: AMBRIEL GORELICK MRN: [id number] DOB: March 02, 1956  Today's TOC FU Call Status: Today's TOC FU Call Status:: Unsuccessful Call (2nd Attempt) Unsuccessful Call (2nd Attempt) Date: 05/22/24  Attempted to reach the patient regarding the most recent Inpatient/ED visit.  Follow Up Plan: Additional outreach attempts will be made to reach the patient to complete the Transitions of Care (Post Inpatient/ED visit) call.   Andrea Dimes RN, BSN [place]  [facility] RN Care Manager [phone]

## 2024-05-23 ENCOUNTER — Telehealth: Payer: Self-pay | Admitting: *Deleted

## 2024-05-23 NOTE — Transitions of Care (Post Inpatient/ED Visit) (Signed)
   05/23/2024  Name: [name] MRN: [id number] DOB: November 03, 1955  Today's TOC FU Call Status: Today's TOC FU Call Status:: Unsuccessful Call (3rd Attempt) Unsuccessful Call (3rd Attempt) Date: 05/23/24  Attempted to reach the patient regarding the most recent Inpatient/ED visit.  Follow Up Plan: No further outreach attempts will be made at this time. We have been unable to contact the patient.  Andrea Dimes RN, BSN [place]  [facility] RN Care Manager [phone]

## 2024-05-27 ENCOUNTER — Encounter

## 2024-05-29 ENCOUNTER — Emergency Department (HOSPITAL_COMMUNITY)

## 2024-05-29 ENCOUNTER — Encounter (HOSPITAL_COMMUNITY): Payer: Self-pay

## 2024-05-29 ENCOUNTER — Inpatient Hospital Stay: Admitting: Family Medicine

## 2024-05-29 ENCOUNTER — Other Ambulatory Visit: Payer: Self-pay

## 2024-05-29 ENCOUNTER — Emergency Department (HOSPITAL_COMMUNITY): Admission: EM | Admit: 2024-05-29 | Discharge: 2024-05-29 | Disposition: A | Discharge status: Home / Self Care

## 2024-05-29 DIAGNOSIS — R0602 Shortness of breath: Secondary | ICD-10-CM | POA: Diagnosis not present

## 2024-05-29 DIAGNOSIS — R0789 Other chest pain: Secondary | ICD-10-CM | POA: Diagnosis not present

## 2024-05-29 DIAGNOSIS — Z7901 Long term (current) use of anticoagulants: Secondary | ICD-10-CM | POA: Insufficient documentation

## 2024-05-29 DIAGNOSIS — I959 Hypotension, unspecified: Secondary | ICD-10-CM | POA: Diagnosis not present

## 2024-05-29 DIAGNOSIS — Z794 Long term (current) use of insulin: Secondary | ICD-10-CM | POA: Diagnosis not present

## 2024-05-29 DIAGNOSIS — R42 Dizziness and giddiness: Secondary | ICD-10-CM | POA: Diagnosis not present

## 2024-05-29 DIAGNOSIS — N189 Chronic kidney disease, unspecified: Secondary | ICD-10-CM | POA: Insufficient documentation

## 2024-05-29 DIAGNOSIS — R079 Chest pain, unspecified: Secondary | ICD-10-CM | POA: Diagnosis not present

## 2024-05-29 DIAGNOSIS — R0989 Other specified symptoms and signs involving the circulatory and respiratory systems: Secondary | ICD-10-CM | POA: Diagnosis not present

## 2024-05-29 DIAGNOSIS — I517 Cardiomegaly: Secondary | ICD-10-CM | POA: Diagnosis not present

## 2024-05-29 LAB — D-DIMER, QUANTITATIVE: D-Dimer, Quant: 0.28 ug{FEU}/mL (ref 0.00–0.50)

## 2024-05-29 LAB — I-STAT CHEM 8, ED
BUN: 13 mg/dL (ref 8–23)
Calcium, Ion: 1 mmol/L — ABNORMAL LOW (ref 1.15–1.40)
Chloride: 92 mmol/L — ABNORMAL LOW (ref 98–111)
Creatinine, Ser: 6.3 mg/dL — ABNORMAL HIGH (ref 0.44–1.00)
Glucose, Bld: 102 mg/dL — ABNORMAL HIGH (ref 70–99)
HCT: 45 % (ref 36.0–46.0)
Hemoglobin: 15.3 g/dL — ABNORMAL HIGH (ref 12.0–15.0)
Potassium: 3.3 mmol/L — ABNORMAL LOW (ref 3.5–5.1)
Sodium: 137 mmol/L (ref 135–145)
TCO2: 28 mmol/L (ref 22–32)

## 2024-05-29 LAB — COMPREHENSIVE METABOLIC PANEL WITH GFR
ALT: 33 U/L (ref 0–44)
AST: 42 U/L — ABNORMAL HIGH (ref 15–41)
Albumin: 3.7 g/dL (ref 3.5–5.0)
Alkaline Phosphatase: 83 U/L (ref 38–126)
Anion gap: 16 — ABNORMAL HIGH (ref 5–15)
BUN: 12 mg/dL (ref 8–23)
CO2: 28 mmol/L (ref 22–32)
Calcium: 9.1 mg/dL (ref 8.9–10.3)
Chloride: 92 mmol/L — ABNORMAL LOW (ref 98–111)
Creatinine, Ser: 5.92 mg/dL — ABNORMAL HIGH (ref 0.44–1.00)
GFR, Estimated: 7 mL/min — ABNORMAL LOW (ref >60)
Glucose, Bld: 101 mg/dL — ABNORMAL HIGH (ref 70–99)
Potassium: 3.4 mmol/L — ABNORMAL LOW (ref 3.5–5.1)
Sodium: 136 mmol/L (ref 135–145)
Total Bilirubin: 1.3 mg/dL — ABNORMAL HIGH (ref 0.0–1.2)
Total Protein: 8.8 g/dL — ABNORMAL HIGH (ref 6.5–8.1)

## 2024-05-29 LAB — TROPONIN I (HIGH SENSITIVITY)
Troponin I (High Sensitivity): 19 ng/L — ABNORMAL HIGH (ref <18)
Troponin I (High Sensitivity): 17 ng/L (ref <18)

## 2024-05-29 LAB — CBC
HCT: 43.2 % (ref 36.0–46.0)
Hemoglobin: 13.9 g/dL (ref 12.0–15.0)
MCH: 30.4 pg (ref 26.0–34.0)
MCHC: 32.2 g/dL (ref 30.0–36.0)
MCV: 94.5 fL (ref 80.0–100.0)
Platelets: 156 K/uL (ref 150–400)
RBC: 4.57 MIL/uL (ref 3.87–5.11)
RDW: 14.1 % (ref 11.5–15.5)
WBC: 6.2 K/uL (ref 4.0–10.5)
nRBC: 0 % (ref 0.0–0.2)

## 2024-05-29 LAB — LIPASE, BLOOD: Lipase: 27 U/L (ref 11–51)

## 2024-05-29 MED ORDER — FENTANYL CITRATE (PF) 50 MCG/ML IJ SOSY
50.0000 ug | PREFILLED_SYRINGE | INTRAMUSCULAR | Status: AC | PRN
  Administered 2024-05-29 (×2): 50 ug via INTRAVENOUS
  Filled 2024-05-29 (×2): qty 1

## 2024-05-29 MED ORDER — ONDANSETRON HCL 4 MG/2ML IJ SOLN
4.0000 mg | Freq: Once | INTRAMUSCULAR | Status: AC
  Administered 2024-05-29: 4 mg via INTRAVENOUS
  Filled 2024-05-29: qty 2

## 2024-05-29 NOTE — ED Provider Notes (Signed)
 [place] EMERGENCY DEPARTMENT AT [facility] Provider Note   CSN: [id number] Arrival date & time: 05/29/24  1053     Patient presents with: Chest Pain   [name] is a 68 y.o. female.    Chest Pain  Patient is a 68 year old female with a past medical history significant for CKD on dialysis Tuesday Thursday Saturday states that she was at dialysis this morning and started to feel short of breath and shortly after that began having some chest pressure and upper abdominal pain.  She describes this pain as constant achy and severe she denies any syncope or near syncope but was found to be briefly hypotensive with SBP of 60 at facility she was given 250 mL of normal saline with improvement in blood pressure to 90 SBP.  She states that the pain in her chest began around 10 AM.  She denies any nausea or vomiting denies any dark or tarry stools her pain is somewhat worse with deep breaths.   States that she has felt somewhat more fatigued over the past week however symptoms became noticeable yesterday.  No recent surgeries, hospitalization, long travel, estrogen containing OCP, cancer history.  No unilateral leg swelling.  No history of PE or VTE.      Prior to Admission medications   Medication Sig Start Date End Date Taking? Authorizing Provider  acetaminophen  (TYLENOL ) 325 MG tablet Take 2 tablets (650 mg total) by mouth every 6 (six) hours as needed for mild pain or moderate pain. Patient taking differently: Take 650 mg by mouth daily as needed for mild pain (pain score 1-3) or moderate pain (pain score 4-6). 12/07/21   Arrien, Elidia Sieving, MD  albuterol  (PROVENTIL  HFA) [phone] Base) MCG/ACT inhaler Inhale 1-2 puffs into the lungs every 6 (six) hours as needed for wheezing or shortness of breath. 10/13/22   Celestia Rosaline SQUIBB, NP  AURYXIA  1 GM 210 MG(Fe) tablet Take 210 mg by mouth 3 (three) times daily.    [provider]  busPIRone  (BUSPAR ) 5 MG  tablet TAKE ONE TABLET BY MOUTH TWICE DAILY @9AM -5PM Patient taking differently: Take 5 mg by mouth 2 (two) times daily. 02/14/24   Celestia Rosaline SQUIBB, NP  diphenhydrAMINE  (BENADRYL ) 25 MG tablet Take 25 mg by mouth every 6 (six) hours as needed for itching. Patient taking differently: Take 25 mg by mouth as needed for itching. 03/31/22   [provider]  doxercalciferol  (HECTOROL ) 4 MCG/2ML injection Inject 1.5 mLs (3 mcg total) into the vein Every Tuesday,Thursday,and Saturday with dialysis. 03/06/24   Amin, Ankit C, MD  DULoxetine  (CYMBALTA ) 60 MG capsule TAKE ONE CAPSULE (60MG  TOTAL) BY MOUTH DAILY AT 9AM 02/29/24   Celestia Rosaline SQUIBB, NP  ELIQUIS  5 MG TABS tablet TAKE ONE TABLET (5MG  TOTAL) BY MOUTH TWICE DAILY @9AM -5PM 09/06/23   Bensimhon, Sieving SAUNDERS, MD  EPINEPHrine  0.3 mg/0.3 mL IJ SOAJ injection Inject 0.3 mg into the muscle as needed for anaphylaxis. Patient not taking: Reported on 05/16/2024 06/25/20   Celestia Rosaline SQUIBB, NP  fluticasone  (FLONASE  ALLERGY RELIEF) 50 MCG/ACT nasal spray Place 2 sprays into both nostrils daily as needed for allergies. Patient taking differently: Place 2 sprays into both nostrils as needed for allergies. 03/31/22   [provider]  gabapentin  (NEURONTIN ) 100 MG capsule Take 1 capsule (100 mg total) by mouth every Tuesday, Thursday, and Saturday at 6 PM. Patient not taking: Reported on 05/16/2024 04/26/24 05/26/24  Cheryle Page, MD  insulin  lispro (HUMALOG  [facility])  100 UNIT/ML KwikPen For blood sugars 0-150 give 0 units of insulin , 151-200 give 2 units of insulin , 201-250 give 4 units, 251-300 give 6 units, 301-350 give 8 units, 351-400 give 10 units,> 400 give 12 units and call M.D. Patient taking differently: Inject 1-2 Units into the skin 3 (three) times daily. Per sliding scale 09/27/23   Newlin, Enobong, MD  lidocaine -prilocaine  (EMLA ) cream Apply 1 Application topically Every Tuesday,Thursday,and Saturday with dialysis. 06/01/22   [provider]  loratadine  (CLARITIN ) 10 MG tablet TAKE ONE TABLET (10 mg total) BY MOUTH DAILY AT 9AM EVERY MORNING. 04/01/24   Celestia Rosaline SQUIBB, NP  Methoxy PEG-Epoetin  Beta (MIRCERA IJ) 50 mcg. 01/29/24 01/27/25  [provider]  metoCLOPramide  (REGLAN ) 5 MG tablet Take 1 tablet (5 mg total) by mouth every 8 (eight) hours as needed for nausea. 07/10/23   Celestia Rosaline SQUIBB, NP  midodrine  (PROAMATINE ) 10 MG tablet Take 1 tablet (10 mg total) by mouth 2 (two) times daily. Patient taking differently: Take 10 mg by mouth daily. May take 1 additional tablet as needed at dialysis, but never needed at home 04/25/24   Cheryle Page, MD  Multiple Vitamins-Minerals (MULTIVITAMIN WOMEN PO) Take 1 tablet by mouth daily.    [provider]  nitroGLYCERIN  (NITROSTAT ) 0.4 MG SL tablet Place 1 tablet (0.4 mg total) under the tongue every 5 (five) minutes x 3 doses as needed for chest pain. Patient not taking: Reported on 05/16/2024 11/08/23   Lonni Slain, MD  ondansetron  (ZOFRAN ) 4 MG tablet Take 1 tablet (4 mg total) by mouth every 8 (eight) hours as needed for nausea or vomiting. 05/20/24   Arlice Reichert, MD  oxyCODONE -acetaminophen  (PERCOCET/ROXICET) 5-325 MG tablet Take 1 tablet by mouth every 8 (eight) hours as needed for severe pain (pain score 7-10). 04/25/24   Cheryle Page, MD  pantoprazole  (PROTONIX ) 40 MG tablet TAKE ONE TABLET BY MOUTH DAILY AT 9AM Patient taking differently: Take 40 mg by mouth daily as needed (for acid reflux). 04/25/24   Celestia Rosaline SQUIBB, NP  polyethylene glycol powder (GLYCOLAX /MIRALAX ) 17 GM/SCOOP powder Dissolve 1 capful (17g) in 4-8 ounces of liquid and take by mouth daily as needed 04/25/24   Cheryle Page, MD  rosuvastatin  (CRESTOR ) 10 MG tablet TAKE ONE TABLET BY MOUTH DAILY AT 5PM 04/04/24   Bensimhon, Daniel R, MD  senna (SENOKOT) 8.6 MG TABS tablet Take 1 tablet (8.6 mg total) by mouth 2 (two) times daily. Patient taking differently: Take 1 tablet  by mouth daily as needed for mild constipation. 04/25/24   Cheryle Page, MD    Allergies: Bee venom, Cefuroxime, Sulfa antibiotics, Chlorhexidine , and Iodinated contrast media    Review of Systems  Cardiovascular:  Positive for chest pain.    Updated Vital Signs BP 99/78   Pulse 78   Temp (!) 97.1 F (36.2 C) (Oral)   Resp 20   Ht 5' 8.4 (1.737 m)   Wt 118.5 kg   SpO2 100%   BMI 39.26 kg/m   Physical Exam Vitals and nursing note reviewed.  Constitutional:      General: She is not in acute distress. HENT:     Head: Normocephalic and atraumatic.     Nose: Nose normal.     Mouth/Throat:     Mouth: Mucous membranes are moist.  Eyes:     General: No scleral icterus. Cardiovascular:     Rate and Rhythm: Normal rate and regular rhythm.     Pulses: Normal pulses.  Heart sounds: Normal heart sounds.  Pulmonary:     Effort: Pulmonary effort is normal. No respiratory distress.     Breath sounds: No wheezing.  Abdominal:     Palpations: Abdomen is soft.     Tenderness: There is no abdominal tenderness. There is no guarding or rebound.  Musculoskeletal:     Cervical back: Normal range of motion.     Right lower leg: Edema present.     Left lower leg: Edema present.     Comments: Symmetric bilateral nonpitting edema  Skin:    General: Skin is warm and dry.     Capillary Refill: Capillary refill takes less than 2 seconds.  Neurological:     Mental Status: She is alert. Mental status is at baseline.  Psychiatric:        Mood and Affect: Mood normal.        Behavior: Behavior normal.     (all labs ordered are listed, but only abnormal results are displayed) Labs Reviewed  COMPREHENSIVE METABOLIC PANEL WITH GFR  CBC  LIPASE, BLOOD  D-DIMER, QUANTITATIVE  I-STAT CHEM 8, ED  TROPONIN I (HIGH SENSITIVITY)    EKG: None  Radiology: No results found.   Procedures   Medications Ordered in the ED  ondansetron  (ZOFRAN ) injection 4 mg (has no administration in  time range)  fentaNYL  (SUBLIMAZE ) injection 50 mcg (has no administration in time range)    Clinical Course as of 05/29/24 1126  Thu May 29, 2024  1116 At dialysis and started feel sob and then started hurting in her chest and belly.  [WF]    Clinical Course User Index [WF] Neldon Hamp RAMAN, [place]                                 Medical Decision Making Amount and/or Complexity of Data Reviewed Labs: ordered. Radiology: ordered.  Risk Prescription drug management.   This patient presents to the ED for concern of CP/SOB, this involves a number of treatment options, and is a complaint that carries with it a high risk of complications and morbidity. A differential diagnosis was considered for the patient's symptoms which is discussed below:   The causes for shortness of breath include but are not limited to Cardiac (AHF, pericardial effusion and tamponade, arrhythmias, ischemia, etc) Respiratory (COPD, asthma, pneumonia, pneumothorax, primary pulmonary hypertension, PE/VQ mismatch) Hematological (anemia) Neuromuscular (ALS, Guillain-Barr, etc)    The emergent causes of chest pain include: Acute coronary syndrome, tamponade, pericarditis/myocarditis, aortic dissection, pulmonary embolism, tension pneumothorax, pneumonia, and esophageal rupture.    Co morbidities: Discussed in HPI   Brief History:  Patient is a 68 year old female with a past medical history significant for CKD on dialysis Tuesday Thursday Saturday states that she was at dialysis this morning and started to feel short of breath and shortly after that began having some chest pressure and upper abdominal pain.  She describes this pain as constant achy and severe she denies any syncope or near syncope but was found to be briefly hypotensive with SBP of 60 at facility she was given 250 mL of normal saline with improvement in blood pressure to 90 SBP.  She states that the pain in her chest began around 10 AM.  She denies  any nausea or vomiting denies any dark or tarry stools her pain is somewhat worse with deep breaths.   States that she has felt somewhat more fatigued over the  past week however symptoms became noticeable yesterday.  No recent surgeries, hospitalization, long travel, estrogen containing OCP, cancer history.  No unilateral leg swelling.  No history of PE or VTE.     EMR reviewed including pt PMHx, past surgical history and past visits to ER.   See HPI for more details   Lab Tests:  I ordered and independently interpreted labs. Labs notable for CMP with mild hypokalemia 3.4 creatinine is at patient's postdialysis normal, D-dimer negative, troponin x 2 normal 19, 17.  No active chest pain CBC without leukocytosis or anemia   Imaging Studies:  Chest x-ray unremarkable    Cardiac Monitoring:  The patient was maintained on a cardiac monitor.  I personally viewed and interpreted the cardiac monitored which showed an underlying rhythm of: Atrial fibrillation normal rate EKG non-ischemic A-fib   Medicines ordered:  I ordered medication including fentanyl  and zofran  for pain/nausea Reevaluation of the patient after these medicines showed that the patient improved I have reviewed the patients home medicines and have made adjustments as needed   Critical Interventions:     Consults/Attending Physician      Reevaluation:  After the interventions noted above I re-evaluated patient and found that they have :resolved   Social Determinants of Health:      Problem List / ED Course:  Somewhat atypical chest pain started today during dialysis felt short of breath had some chest pain and abdominal pain all of the symptoms have resolved with 1 dose of fentanyl  and a dose of Zofran .  She is tolerating p.o. ambulating feels improved.  I discussed admission with her however she feels well to go home.   Dispostion:  After consideration of the diagnostic results and the  patients response to treatment, I feel that the patent would benefit from outpatient follow-up with cardiology and primary care.  Return to the emergency room for any new or concerning symptoms, strict return precautions discussed with patient.  Final diagnoses:  Atypical chest pain    ED Discharge Orders     None          Neldon Hamp RAMAN, [place] 05/29/24 1504    Simon Lavonia SAILOR, MD 05/30/24 1331

## 2024-05-29 NOTE — Discharge Instructions (Addendum)
 Please call to make an appointment with your cardiologist.  Your workup today has been reassuring, however--as we discussed--I would like you to keep a very close eye on your symptoms and return to the emergency room for new chest pain.  Your sinus congestion and other symptoms certainly increase my suspicion that your symptoms may be related to a viral illness.  Your blood pressure is looking much better after a small amount of fluids that the EMS team gave you.  Since you are able to walk around and you are feeling better and have no chest pain I feel comfortable sending you home with the knowledge that he will return to the emergency room if you develop new or worsening symptoms such as coughing up blood shortness of breath or chest pain.

## 2024-05-29 NOTE — ED Triage Notes (Signed)
 BIB EMS from Dialysis r/t CP/SOB with hypotension  (60 SBP) during treatment. Facility administered 250ml NS with BP improvement (90 SBP). A&Ox4. Last full tx Tuesday. Dialysis T/Th/Sat.

## 2024-06-02 ENCOUNTER — Telehealth (INDEPENDENT_AMBULATORY_CARE_PROVIDER_SITE_OTHER): Payer: Self-pay

## 2024-06-02 NOTE — Telephone Encounter (Signed)
 Returned Leita call and provided verbal orders

## 2024-06-02 NOTE — Telephone Encounter (Signed)
 Copied from CRM [id number]. Topic: Medical Record Request - Provider/Facility Request >> Jun 02, 2024  3:12 PM Ivette P wrote: Reason for CRM: sonya from [facility] called in to check on orders that were faxed on 09/26  Pleas call with update    Callback [phone]

## 2024-06-02 NOTE — Telephone Encounter (Signed)
 Copied from CRM [id number]. Topic: Clinical - Home Health Verbal Orders >> May 30, 2024  4:33 PM Delon DASEN wrote: Caller/Agency: Leita with [facility] Callback Number: [phone] Service Requested: Physical Therapy Frequency: n/a Any new concerns about the patient? Yes- tried every day this week to see the patient and they would not allow her in, said patient did not feel well, today no answer at the door, missed all appt this week. Patient will need to call if she wants the services.

## 2024-06-05 ENCOUNTER — Other Ambulatory Visit (INDEPENDENT_AMBULATORY_CARE_PROVIDER_SITE_OTHER): Payer: Self-pay | Admitting: Primary Care

## 2024-06-06 NOTE — Telephone Encounter (Signed)
 Requested Prescriptions  Pending Prescriptions Disp Refills   loratadine  (CLARITIN ) 10 MG tablet [Pharmacy Med Name: loratadine  10 mg tablet] 90 tablet 0    Sig: TAKE ONE TABLET (10 MG TOTAL) BY MOUTH DAILY AT 9AM EVERY MORNING     Ear, Nose, and Throat:  Antihistamines 2 Failed - 06/06/2024  2:21 PM      Failed - Cr in normal range and within 360 days    Creatinine, Ser  Date Value Ref Range Status  05/29/2024 6.30 (H) 0.44 - 1.00 mg/dL Final   Creatinine, Urine  Date Value Ref Range Status  07/29/2021 97.28 mg/dL Final    Comment:    Performed at [facility], [address] [place], [place] [address]         Passed - Valid encounter within last 12 months    Recent Outpatient Visits           6 months ago Generalized anxiety disorder   [place] [facility] Celestia Rosaline SQUIBB, NP   6 months ago Cough productive of purulent sputum   [place] [facility] Celestia Rosaline SQUIBB, NP   1 year ago Generalized anxiety disorder   [place] [facility] Celestia Rosaline SQUIBB, NP   1 year ago Diabetes mellitus type 2 in obese [facility])   [place] [facility] Celestia Rosaline SQUIBB, NP   1 year ago Diabetes mellitus type 2 in obese [facility])   [facility] [place] Delbert Clam, MD

## 2024-06-09 ENCOUNTER — Encounter (INDEPENDENT_AMBULATORY_CARE_PROVIDER_SITE_OTHER): Payer: 59

## 2024-06-17 ENCOUNTER — Encounter (INDEPENDENT_AMBULATORY_CARE_PROVIDER_SITE_OTHER)

## 2024-06-20 ENCOUNTER — Telehealth: Payer: Self-pay

## 2024-06-20 NOTE — Telephone Encounter (Signed)
 Copied from CRM [id number]. Topic: General - Other >> Jun 19, 2024  1:53 PM Delon HERO wrote: Reason for CRM: Yes Ronnald calling from [facility] needing to send an order but can not send to NP need to send to supervising MD. CB- [phone] CAL was closed during lunch.

## 2024-06-20 NOTE — Telephone Encounter (Signed)
 The number that is listed is not the correct number. Dialed the number and there is ring tone just silence

## 2024-07-02 ENCOUNTER — Ambulatory Visit: Attending: Primary Care

## 2024-07-02 DIAGNOSIS — I442 Atrioventricular block, complete: Secondary | ICD-10-CM

## 2024-07-03 ENCOUNTER — Other Ambulatory Visit (INDEPENDENT_AMBULATORY_CARE_PROVIDER_SITE_OTHER): Payer: Self-pay | Admitting: Primary Care

## 2024-07-03 LAB — CUP PACEART REMOTE DEVICE CHECK
Battery Remaining Longevity: 120 mo
Battery Voltage: 3.08 V
Brady Statistic AS VP Percent: 98.93 %
Brady Statistic AS VS Percent: 1.07 %
Brady Statistic RV Percent Paced: 99.72 %
Date Time Interrogation Session: 20251112154228
Implantable Pulse Generator Implant Date: 20250807
Lead Channel Impedance Value: 560 Ohm
Lead Channel Pacing Threshold Amplitude: 0.5 V
Lead Channel Pacing Threshold Pulse Width: 0.24 ms
Lead Channel Sensing Intrinsic Amplitude: >=5
Lead Channel Setting Pacing Amplitude: 2 V
Lead Channel Setting Pacing Pulse Width: 0.24 ms
Lead Channel Setting Sensing Sensitivity: 2 mV

## 2024-07-03 NOTE — Progress Notes (Deleted)
 Electrophysiology Office Note:   Date:  07/03/2024  ID:  ATHENA BALTZ, DOB 07-17-1956, MRN [id number]  Primary Cardiologist: Shelda Bruckner, MD Electrophysiologist: [name], MD  {Click to update primary MD,subspecialty MD or APP then REFRESH:1}    History of Present Illness:   [name] is a 68 y.o. female with h/o permanent atrial fibrillation s/p MDT Micra AV2 implant and AVN ablation, severe MR/TR s/p MV and TV repair (12/29/20, Dr. Kerrin), HFpEF, prior DVT, ESRD on IHD, COPD and obesity who is being seen today for follow up.  Discussed the use of AI scribe software for clinical note transcription with the patient, who gave verbal consent to proceed.  History of Present Illness     Review of systems complete and found to be negative unless listed in HPI.   EP Information / Studies Reviewed:    {EKGtoday:28818}      Coronary CTA 12/26/23: IMPRESSION: 1. Coronary calcium  score of 58.9. This was 65 percentile for age and sex matched control.   2. Normal coronary origin with right dominance.   3. CAD-RADS 2. Mild non-obstructive CAD (25-49%). Consider non-atherosclerotic causes of chest pain. Consider preventive therapy and risk factor modification.   4. Total plaque volume 56 mm3 which is 26th percentile for age- and sex-matched controls (calcified plaque 15 mm3; non-calcified plaque 41 mm3,Low attenuation 1 mm3).   5. The pulmonary artery is mildly dilated (3.25 cm).   6. Status post annuloplasty of the mitral and tricuspid valve, annuloplasty ring well seated.   7. Status post left atrial appendage closure device.   8. Right atrium and right ventricle are both enlarged.   The noncardiac portion of this study will be interpreted in separate report by the radiologist.  Echo 10/15/23:  1. 3D EF 69%. Left ventricular ejection fraction, by estimation, is 55 to  60%. The left ventricle has normal function. The left ventricle  has no  regional wall motion abnormalities. Left ventricular diastolic parameters  are indeterminate.   2. Right ventricular systolic function is mildly reduced. The right  ventricular size is mildly enlarged.   3. Left atrial size was severely dilated.   4. Right atrial size was severely dilated.   5. Post repair with ring Mild residual MR Mean diastolic gradient 8 mmHg  at HR 68 bpm. The mitral valve has been repaired/replaced. No evidence of  mitral valve regurgitation. No evidence of mitral stenosis.   6. Post repair with ring. The tricuspid valve is has been  repaired/replaced. Tricuspid valve regurgitation is moderate.   7. The aortic valve is tricuspid. Aortic valve regurgitation is not  visualized. No aortic stenosis is present.   8. The inferior vena cava is normal in size with greater than 50%  respiratory variability, suggesting right atrial pressure of 3 mmHg.        Physical Exam:   VS:  There were no vitals taken for this visit.   Wt Readings from Last 3 Encounters:  05/29/24 261 lb 3.9 oz (118.5 kg)  05/20/24 261 lb 3.9 oz (118.5 kg)  05/13/24 266 lb (120.7 kg)     GEN: Well nourished, well developed in no acute distress NECK: No JVD CARDIAC: {EPRHYTHM:28826}, no murmurs, rubs, gallops RESPIRATORY:  Clear to auscultation without rales, wheezing or rhonchi  ABDOMEN: Soft, non-distended EXTREMITIES:  No edema; No deformity   ASSESSMENT AND PLAN:    #CHB s/p AVN ablation and leadless pacemaker implant:  #Permanent atrial fibrillation: H/o MAZE and multiple cardioversions. 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 -Able to tolerate GDMT due to hypotension with dialysis. Assessment & Plan       Follow up with {EPMDS:28135::EP Team} {EPFOLLOW LE:71826}  Signed, [name], MD

## 2024-07-04 ENCOUNTER — Ambulatory Visit: Attending: Cardiology | Admitting: Cardiology

## 2024-07-04 DIAGNOSIS — Z950 Presence of cardiac pacemaker: Secondary | ICD-10-CM

## 2024-07-04 DIAGNOSIS — N186 End stage renal disease: Secondary | ICD-10-CM

## 2024-07-04 DIAGNOSIS — I442 Atrioventricular block, complete: Secondary | ICD-10-CM

## 2024-07-04 DIAGNOSIS — I4821 Permanent atrial fibrillation: Secondary | ICD-10-CM

## 2024-07-04 DIAGNOSIS — I5020 Unspecified systolic (congestive) heart failure: Secondary | ICD-10-CM

## 2024-07-04 NOTE — Telephone Encounter (Signed)
 Will forward to provider

## 2024-07-07 NOTE — Progress Notes (Signed)
 Remote PPM Transmission

## 2024-07-09 ENCOUNTER — Other Ambulatory Visit (INDEPENDENT_AMBULATORY_CARE_PROVIDER_SITE_OTHER): Payer: Self-pay | Admitting: Primary Care

## 2024-07-09 NOTE — Telephone Encounter (Unsigned)
 Copied from CRM #[id number]. Topic: Clinical - Medication Refill >> Jul 09, 2024  2:14 PM Sophia H wrote: Medication: albuterol  (VENTOLIN  HFA) 108 (90 Base) MCG/ACT inhaler **Patient is also requesting meds for nausea, not listed in chart  Has the patient contacted their pharmacy? Yes, updated RX    This is the patient's preferred pharmacy:  [facility] [address] [place], [place] - [address] [place] [address] Phone: [phone] Fax: [phone]   Is this the correct pharmacy for this prescription? Yes If no, delete pharmacy and type the correct one.   Has the prescription been filled recently? Yes  Is the patient out of the medication? Yes  Has the patient been seen for an appointment in the last year OR does the patient have an upcoming appointment? Yes, appt 12/16  Can we respond through MyChart? Yes  Agent: Please be advised that Rx refills may take up to 3 business days. We ask that you follow-up with your pharmacy.

## 2024-07-10 MED ORDER — ALBUTEROL SULFATE HFA 108 (90 BASE) MCG/ACT IN AERS: 1.0000 | INHALATION_SPRAY | Freq: Four times a day (QID) | RESPIRATORY_TRACT | 0 refills | Status: DC | PRN

## 2024-07-10 NOTE — Telephone Encounter (Signed)
 Called patient to clarify which medication was needed for nausea. Patient stated that Zofran  was needed. Patient states that Rosaline has refilled this medication previously. Last refill by Dr. Dahal. Advised patient that Albuterol  was refilled on 07/04/24 at the requested pharmacy. Patient stated that she contacted the pharmacy and they did not have the refill. Will resend to the pharmacy.

## 2024-07-10 NOTE — Telephone Encounter (Signed)
 Requested medication (s) are due for refill today: yes   Requested medication (s) are on the active medication list: yes  Last refill:  Last month by Hospitalist   Future visit scheduled: yes   Notes to clinic:  Refill not delegated per protocol    Requested Prescriptions  Pending Prescriptions Disp Refills   ondansetron  (ZOFRAN ) 4 MG tablet 30 tablet 0    Sig: Take 1 tablet (4 mg total) by mouth every 8 (eight) hours as needed for nausea or vomiting.     Not Delegated - Gastroenterology: Antiemetics - ondansetron  Failed - 07/10/2024  3:38 PM      Failed - This refill cannot be delegated      Failed - AST in normal range and within 360 days    AST  Date Value Ref Range Status  05/29/2024 42 (H) 15 - 41 U/L Final         Passed - ALT in normal range and within 360 days    ALT  Date Value Ref Range Status  05/29/2024 33 0 - 44 U/L Final         Passed - Valid encounter within last 6 months    Recent Outpatient Visits           7 months ago Generalized anxiety disorder   [place] [facility] Celestia Rosaline SQUIBB, NP   7 months ago Cough productive of purulent sputum   [place] [facility] Celestia Rosaline SQUIBB, NP   1 year ago Generalized anxiety disorder   [place] [facility] Celestia Rosaline SQUIBB, NP   1 year ago Diabetes mellitus type 2 in obese [facility])   [place] [facility] Celestia Rosaline SQUIBB, NP   1 year ago Diabetes mellitus type 2 in obese [facility])   [place] [facility] [place]. [facility] Delbert Clam, MD              Signed Prescriptions Disp Refills   albuterol  (VENTOLIN  HFA) 108 (90 Base) MCG/ACT inhaler 7 g 0    Sig: Inhale 1-2 puffs into the lungs every 6 (six) hours as needed for wheezing or shortness of breath.     Pulmonology:  Beta Agonists 2 Passed - 07/10/2024  3:38 PM      Passed - Last BP in normal range    BP Readings from Last 1  Encounters:  05/29/24 112/74         Passed - Last Heart Rate in normal range    Pulse Readings from Last 1 Encounters:  05/29/24 74         Passed - Valid encounter within last 12 months    Recent Outpatient Visits           7 months ago Generalized anxiety disorder   [place] [facility] Celestia Rosaline SQUIBB, NP   7 months ago Cough productive of purulent sputum   [place] [facility] Celestia Rosaline SQUIBB, NP   1 year ago Generalized anxiety disorder   [place] [facility] Celestia Rosaline SQUIBB, NP   1 year ago Diabetes mellitus type 2 in obese [facility])   [place] [facility] Celestia Rosaline SQUIBB, NP   1 year ago Diabetes mellitus type 2 in obese [facility])   [place] [facility] [place]. [facility] Delbert Clam, MD

## 2024-07-10 NOTE — Telephone Encounter (Signed)
 Requested Prescriptions  Pending Prescriptions Disp Refills   ondansetron  (ZOFRAN ) 4 MG tablet 30 tablet 0    Sig: Take 1 tablet (4 mg total) by mouth every 8 (eight) hours as needed for nausea or vomiting.     Not Delegated - Gastroenterology: Antiemetics - ondansetron  Failed - 07/10/2024  3:33 PM      Failed - This refill cannot be delegated      Failed - AST in normal range and within 360 days    AST  Date Value Ref Range Status  05/29/2024 42 (H) 15 - 41 U/L Final         Passed - ALT in normal range and within 360 days    ALT  Date Value Ref Range Status  05/29/2024 33 0 - 44 U/L Final         Passed - Valid encounter within last 6 months    Recent Outpatient Visits           7 months ago Generalized anxiety disorder   [place] [facility] Celestia Rosaline SQUIBB, NP   7 months ago Cough productive of purulent sputum   [place] [facility] Celestia Rosaline SQUIBB, NP   1 year ago Generalized anxiety disorder   [place] [facility] Celestia Rosaline SQUIBB, NP   1 year ago Diabetes mellitus type 2 in obese [facility])   [place] [facility] Celestia Rosaline SQUIBB, NP   1 year ago Diabetes mellitus type 2 in obese [facility])   [place] [facility] [place]. [facility] Delbert, Corrina, MD               albuterol  (VENTOLIN  HFA) [phone] Base) MCG/ACT inhaler 7 g 0    Sig: Inhale 1-2 puffs into the lungs every 6 (six) hours as needed for wheezing or shortness of breath.     Pulmonology:  Beta Agonists 2 Passed - 07/10/2024  3:33 PM      Passed - Last BP in normal range    BP Readings from Last 1 Encounters:  05/29/24 112/74         Passed - Last Heart Rate in normal range    Pulse Readings from Last 1 Encounters:  05/29/24 74         Passed - Valid encounter within last 12 months    Recent Outpatient Visits           7 months ago Generalized anxiety disorder   [place]  [facility] Celestia Rosaline SQUIBB, NP   7 months ago Cough productive of purulent sputum   [place] [facility] Celestia Rosaline SQUIBB, NP   1 year ago Generalized anxiety disorder   [place] [facility] Celestia Rosaline SQUIBB, NP   1 year ago Diabetes mellitus type 2 in obese [facility])   [place] [facility] Celestia Rosaline SQUIBB, NP   1 year ago Diabetes mellitus type 2 in obese [facility])   [place] [facility] [place]. [facility] Delbert Corrina, MD

## 2024-07-24 ENCOUNTER — Ambulatory Visit: Payer: Self-pay

## 2024-07-24 NOTE — Telephone Encounter (Signed)
 FYI Only or Action Required?: FYI only for provider: Patient refuses sooner appt at another office stating she only wants to see her provider, states she will visit UC if symptoms worsen.  Patient was last seen in primary care on 01/21/2024 by Celestia Rosaline SQUIBB, NP.  Called Nurse Triage reporting Cough.  Symptoms began a week ago.  Interventions attempted: Nothing.  Symptoms are: unchanged.  Triage Disposition: See PCP When Office is Open (Within 3 Days)  Patient/caregiver understands and will follow disposition?: No, refuses disposition  Reason for Disposition  [1] Nasal discharge AND [2] present > 10 days  Answer Assessment - Initial Assessment Questions Patient calls in stating she has had a dry, non-productive cough since 11/22 with runny nose. Advised to make appt with PCP within 3 days, but no available appt with patient's PCP office until the end of the month. Patient refuses sooner appt at another office stating she has an appt with Rosaline on 08/08/24 and will keep this appt as she does not wish to see any other provider. States she will visit UC if her symptoms worsen.    1. ONSET: When did the cough begin?      Saturday 11/22  2. SEVERITY: How bad is the cough today?      Moderate  3. SPUTUM: Describe the color of your sputum (e.g., none, dry cough; clear, white, yellow, green)     None  4. HEMOPTYSIS: Are you coughing up any blood? If Yes, ask: How much? (e.g., flecks, streaks, tablespoons, etc.)     No  5. DIFFICULTY BREATHING: Are you having difficulty breathing? If Yes, ask: How bad is it? (e.g., mild, moderate, severe)      Mild-pt states this is her baseline  6. FEVER: Do you have a fever? If Yes, ask: What is your temperature, how was it measured, and when did it start?     No  7. CARDIAC HISTORY: Do you have any history of heart disease? (e.g., heart attack, congestive heart failure)      CHF, HTN  8. LUNG HISTORY: Do you have any  history of lung disease?  (e.g., pulmonary embolus, asthma, emphysema)     COPD  9. PE RISK FACTORS: Do you have a history of blood clots? (or: recent major surgery, recent prolonged travel, bedridden)     No  10. OTHER SYMPTOMS: Do you have any other symptoms? (e.g., runny nose, wheezing, chest pain)       Runny nose, patient reports chest pain and states that she feels it's from coughing so much. She describes it as being over the sternum and states that it comes on for a while after coughing a lot. Reports SOB, but states this is her baseline. Denies dizziness, lightheadedness, nausea/vomiting, fatigue.   11. PREGNANCY: Is there any chance you are pregnant? When was your last menstrual period?       NA  12. TRAVEL: Have you traveled out of the country in the last month? (e.g., travel history, exposures)       No  Protocols used: Cough - Acute Non-Productive-A-AH  Copied from CRM #[id number]. Topic: Clinical - Red Word Triage >> Jul 24, 2024  2:42 PM Rosaria BRAVO wrote: Red Word that prompted transfer to Nurse Triage: Chest pain from cough, seeking assistance

## 2024-08-01 ENCOUNTER — Other Ambulatory Visit (HOSPITAL_COMMUNITY): Payer: Self-pay

## 2024-08-01 ENCOUNTER — Other Ambulatory Visit: Payer: Self-pay

## 2024-08-01 DIAGNOSIS — F4323 Adjustment disorder with mixed anxiety and depressed mood: Secondary | ICD-10-CM

## 2024-08-01 DIAGNOSIS — K3184 Gastroparesis: Secondary | ICD-10-CM

## 2024-08-01 DIAGNOSIS — F32A Depression, unspecified: Secondary | ICD-10-CM

## 2024-08-01 DIAGNOSIS — I4819 Other persistent atrial fibrillation: Secondary | ICD-10-CM

## 2024-08-01 DIAGNOSIS — E1169 Type 2 diabetes mellitus with other specified complication: Secondary | ICD-10-CM

## 2024-08-01 DIAGNOSIS — E1142 Type 2 diabetes mellitus with diabetic polyneuropathy: Secondary | ICD-10-CM

## 2024-08-01 DIAGNOSIS — Z760 Encounter for issue of repeat prescription: Secondary | ICD-10-CM

## 2024-08-01 MED ORDER — BUSPIRONE HCL 5 MG PO TABS: 5.0000 mg | ORAL_TABLET | Freq: Two times a day (BID) | ORAL | 1 refills | Status: AC

## 2024-08-01 MED ORDER — APIXABAN 5 MG PO TABS: 5.0000 mg | ORAL_TABLET | Freq: Two times a day (BID) | ORAL | 11 refills | Status: DC

## 2024-08-01 MED ORDER — LORATADINE 10 MG PO TABS: 10.0000 mg | ORAL_TABLET | Freq: Every day | ORAL | 1 refills | Status: AC

## 2024-08-01 MED ORDER — DULOXETINE HCL 60 MG PO CPEP: 60.0000 mg | ORAL_CAPSULE | Freq: Every day | ORAL | 1 refills | Status: AC

## 2024-08-01 MED ORDER — ALBUTEROL SULFATE HFA 108 (90 BASE) MCG/ACT IN AERS: 1.0000 | INHALATION_SPRAY | Freq: Four times a day (QID) | RESPIRATORY_TRACT | 1 refills | Status: DC | PRN

## 2024-08-01 MED ORDER — ROSUVASTATIN CALCIUM 10 MG PO TABS: 10.0000 mg | ORAL_TABLET | Freq: Every day | ORAL | 11 refills | Status: DC

## 2024-08-05 ENCOUNTER — Other Ambulatory Visit (HOSPITAL_COMMUNITY): Payer: Self-pay | Admitting: Cardiology

## 2024-08-05 ENCOUNTER — Other Ambulatory Visit (INDEPENDENT_AMBULATORY_CARE_PROVIDER_SITE_OTHER): Payer: Self-pay | Admitting: Primary Care

## 2024-08-05 DIAGNOSIS — I4819 Other persistent atrial fibrillation: Secondary | ICD-10-CM

## 2024-08-05 DIAGNOSIS — E1142 Type 2 diabetes mellitus with diabetic polyneuropathy: Secondary | ICD-10-CM

## 2024-08-05 DIAGNOSIS — F32A Depression, unspecified: Secondary | ICD-10-CM

## 2024-08-05 DIAGNOSIS — F4323 Adjustment disorder with mixed anxiety and depressed mood: Secondary | ICD-10-CM

## 2024-08-05 DIAGNOSIS — K3184 Gastroparesis: Secondary | ICD-10-CM

## 2024-08-05 DIAGNOSIS — Z760 Encounter for issue of repeat prescription: Secondary | ICD-10-CM

## 2024-08-05 DIAGNOSIS — E1169 Type 2 diabetes mellitus with other specified complication: Secondary | ICD-10-CM

## 2024-08-05 MED ORDER — APIXABAN 5 MG PO TABS: 5.0000 mg | ORAL_TABLET | Freq: Two times a day (BID) | ORAL | 3 refills | Status: DC

## 2024-08-05 MED ORDER — ROSUVASTATIN CALCIUM 10 MG PO TABS: 10.0000 mg | ORAL_TABLET | Freq: Every day | ORAL | 3 refills | Status: AC

## 2024-08-05 NOTE — Telephone Encounter (Unsigned)
 Copied from CRM #[id number]. Topic: Clinical - Medication Refill >> Aug 05, 2024  1:58 PM Zebedee SAUNDERS wrote: Medication: albuterol  (VENTOLIN  HFA) 108 (90 Base) MCG/ACT inhaler,  loratadine  (CLARITIN ) 10 MG tablet, DULoxetine  (CYMBALTA ) 60 MG capsule, busPIRone  (BUSPAR ) 5 MG tablet, pantoprazole  (PROTONIX ) 40 MG tablet Has the patient contacted their pharmacy? Yes (Agent: If no, request that the patient contact the pharmacy for the refill. If patient does not wish to contact the pharmacy document the reason why and proceed with request.) (Agent: If yes, when and what did the pharmacy advise?)  This is the patient's preferred pharmacy:   [facility] [place], [place] - [address] [place] [address] Phone: [phone] Fax: [phone]  Is this the correct pharmacy for this prescription? Yes If no, delete pharmacy and type the correct one.   Has the prescription been filled recently? Yes  Is the patient out of the medication? Yes  Has the patient been seen for an appointment in the last year OR does the patient have an upcoming appointment? Yes  Can we respond through MyChart? Yes  Agent: Please be advised that Rx refills may take up to 3 business days. We ask that you follow-up with your pharmacy.

## 2024-08-06 ENCOUNTER — Other Ambulatory Visit (INDEPENDENT_AMBULATORY_CARE_PROVIDER_SITE_OTHER): Payer: Self-pay | Admitting: Primary Care

## 2024-08-06 DIAGNOSIS — F4323 Adjustment disorder with mixed anxiety and depressed mood: Secondary | ICD-10-CM

## 2024-08-08 ENCOUNTER — Encounter (INDEPENDENT_AMBULATORY_CARE_PROVIDER_SITE_OTHER): Payer: Self-pay | Admitting: Primary Care

## 2024-08-08 ENCOUNTER — Other Ambulatory Visit (INDEPENDENT_AMBULATORY_CARE_PROVIDER_SITE_OTHER): Payer: Self-pay

## 2024-08-08 ENCOUNTER — Ambulatory Visit (INDEPENDENT_AMBULATORY_CARE_PROVIDER_SITE_OTHER): Admitting: Primary Care

## 2024-08-08 VITALS — BP 108/65 | HR 65 | Resp 16 | Wt 272.2 lb

## 2024-08-08 DIAGNOSIS — Z780 Asymptomatic menopausal state: Secondary | ICD-10-CM | POA: Diagnosis not present

## 2024-08-08 DIAGNOSIS — R058 Other specified cough: Secondary | ICD-10-CM

## 2024-08-08 DIAGNOSIS — F4323 Adjustment disorder with mixed anxiety and depressed mood: Secondary | ICD-10-CM

## 2024-08-08 DIAGNOSIS — F32A Depression, unspecified: Secondary | ICD-10-CM

## 2024-08-08 DIAGNOSIS — E1142 Type 2 diabetes mellitus with diabetic polyneuropathy: Secondary | ICD-10-CM

## 2024-08-08 DIAGNOSIS — Z760 Encounter for issue of repeat prescription: Secondary | ICD-10-CM

## 2024-08-08 DIAGNOSIS — Z1231 Encounter for screening mammogram for malignant neoplasm of breast: Secondary | ICD-10-CM

## 2024-08-08 MED ORDER — FLUTICASONE PROPIONATE 50 MCG/ACT NA SUSP: 2.0000 | Freq: Every day | NASAL | 6 refills | Status: AC

## 2024-08-08 MED ORDER — BENZONATATE 100 MG PO CAPS: 100.0000 mg | ORAL_CAPSULE | Freq: Two times a day (BID) | ORAL | 0 refills | Status: DC | PRN

## 2024-08-08 NOTE — Telephone Encounter (Signed)
 All requested Rx have been filled recently or have refills- no change in pharmacy Requested Prescriptions  Refused Prescriptions Disp Refills   albuterol  (VENTOLIN  HFA) 108 (90 Base) MCG/ACT inhaler 7 g 1    Sig: Inhale 1-2 puffs into the lungs every 6 (six) hours as needed for wheezing or shortness of breath.     Pulmonology:  Beta Agonists 2 Passed - 08/08/2024 11:27 AM      Passed - Last BP in normal range    BP Readings from Last 1 Encounters:  08/08/24 108/65         Passed - Last Heart Rate in normal range    Pulse Readings from Last 1 Encounters:  08/08/24 65         Passed - Valid encounter within last 12 months    Recent Outpatient Visits           Today Encounter for screening mammogram for breast cancer   [place] [facility] Celestia Rosaline SQUIBB, NP   8 months ago Generalized anxiety disorder   [place] [facility] Celestia Rosaline SQUIBB, NP   8 months ago Cough productive of purulent sputum   [place] [facility] Celestia Rosaline SQUIBB, NP   1 year ago Generalized anxiety disorder   [place] [facility] Celestia Rosaline SQUIBB, NP   1 year ago Diabetes mellitus type 2 in obese [facility])   [place] [facility] Celestia Rosaline SQUIBB, NP               loratadine  (CLARITIN ) 10 MG tablet 90 tablet 1    Sig: Take 1 tablet (10 mg total) by mouth daily.     Ear, Nose, and Throat:  Antihistamines 2 Failed - 08/08/2024 11:27 AM      Failed - Cr in normal range and within 360 days    Creatinine, Ser  Date Value Ref Range Status  05/29/2024 6.30 (H) 0.44 - 1.00 mg/dL Final   Creatinine, Urine  Date Value Ref Range Status  07/29/2021 97.28 mg/dL Final    Comment:    Performed at [facility], [address] [place], [place] [address]         Passed - Valid encounter within last 12 months    Recent Outpatient Visits           Today Encounter for screening mammogram for  breast cancer   [place] [facility] Celestia Rosaline SQUIBB, NP   8 months ago Generalized anxiety disorder   [place] [facility] Celestia Rosaline SQUIBB, NP   8 months ago Cough productive of purulent sputum   [place] [facility] Celestia Rosaline SQUIBB, NP   1 year ago Generalized anxiety disorder   [place] [facility] Celestia Rosaline SQUIBB, NP   1 year ago Diabetes mellitus type 2 in obese [facility])   [place] [facility] Celestia Rosaline SQUIBB, NP               busPIRone  (BUSPAR ) 5 MG tablet 180 tablet 1    Sig: Take 1 tablet (5 mg total) by mouth 2 (two) times daily.     Psychiatry: Anxiolytics/Hypnotics - Non-controlled Passed - 08/08/2024 11:27 AM      Passed - Valid encounter within last 12 months    Recent Outpatient Visits           Today Encounter for screening mammogram for breast cancer  [place] [facility] Celestia Rosaline SQUIBB, NP   8 months ago Generalized anxiety disorder   [place] [facility] Celestia Rosaline SQUIBB, NP   8 months ago Cough productive of purulent sputum   [place] [facility] Celestia Rosaline SQUIBB, NP   1 year ago Generalized anxiety disorder   [place] [facility] Celestia Rosaline SQUIBB, NP   1 year ago Diabetes mellitus type 2 in obese [facility])   [place] [facility] Celestia Rosaline SQUIBB, NP               pantoprazole  (PROTONIX ) 40 MG tablet 90 tablet 11     Gastroenterology: Proton Pump Inhibitors Passed - 08/08/2024 11:27 AM      Passed - Valid encounter within last 12 months    Recent Outpatient Visits           Today Encounter for screening mammogram for breast cancer   [place] [facility] Celestia Rosaline SQUIBB, NP   8 months ago Generalized anxiety disorder   [place] [facility] Celestia Rosaline SQUIBB, NP   8 months ago Cough  productive of purulent sputum   [place] [facility] Celestia Rosaline SQUIBB, NP   1 year ago Generalized anxiety disorder   [place] [facility] Celestia Rosaline SQUIBB, NP   1 year ago Diabetes mellitus type 2 in obese [facility])   [place] [facility] Celestia Rosaline SQUIBB, NP               DULoxetine  (CYMBALTA ) 60 MG capsule 180 capsule 1    Sig: Take 1 capsule (60 mg total) by mouth daily.     Psychiatry: Antidepressants - SNRI - duloxetine  Failed - 08/08/2024 11:27 AM      Failed - Cr in normal range and within 360 days    Creatinine, Ser  Date Value Ref Range Status  05/29/2024 6.30 (H) 0.44 - 1.00 mg/dL Final   Creatinine, Urine  Date Value Ref Range Status  07/29/2021 97.28 mg/dL Final    Comment:    Performed at [facility], [address], [place], [place] [address]         Failed - eGFR is 30 or above and within 360 days    GFR calc Af Amer  Date Value Ref Range Status  12/24/2023 5 (L) >60 mL/min Final   GFR, Estimated  Date Value Ref Range Status  05/29/2024 7 (L) >60 mL/min Final    Comment:    (NOTE) Calculated using the CKD-EPI Creatinine Equation (2021)    eGFR  Date Value Ref Range Status  02/29/2024 6 (L) >59 mL/min/1.73 Final         Passed - Completed PHQ-2 or PHQ-9 in the last 360 days      Passed - Last BP in normal range    BP Readings from Last 1 Encounters:  08/08/24 108/65         Passed - Valid encounter within last 6 months    Recent Outpatient Visits           Today Encounter for screening mammogram for breast cancer   [place] [facility] Celestia Rosaline SQUIBB, NP   8 months ago Generalized anxiety disorder   [place] [facility] Celestia Rosaline SQUIBB, NP   8 months ago Cough productive of purulent sputum    [facility] Celestia Rosaline SQUIBB, NP  1 year ago Generalized anxiety disorder   [place] [facility] Celest[facility] Rosaline SQUIBB, NP   1 year ago Diabetes mellitus type 2 in obese [facility])   [place] [facility] Celestia Rosaline SQUIBB, NP

## 2024-08-10 NOTE — Progress Notes (Signed)
 " [facility]  [name], is a 68 y.o. female  RDW:[id number]  FMW:[id number]  DOB - [birth year]  Chief Complaint  Patient presents with   Cyst    Pt states she has boils in her nose   Cough       Subjective:   Siearra Gallagher is a 68 y.o. female here today for an acute visit.  Cold-like symptoms with nasal congestion and cough.  Denies fever chills or shortness of breath. Patient tried to show PCP bump inside left nostril not present at this time but red boggy turbinates left greater than right. Cough    No problems updated.  Comprehensive ROS Pertinent positive and negative noted in HPI   Allergies[1]  Past Medical History:  Diagnosis Date   Acute on chronic systolic CHF (congestive heart failure) (HCC) 12/21/2020   Allergic rhinitis    Anemia    Arthritis    Asthma    Atrial fibrillation with RVR (HCC) 11/09/2023   Chronic diastolic CHF (congestive heart failure) (HCC)    Chronic kidney disease    Dialysis Tu/TH/Sa   COPD (chronic obstructive pulmonary disease) (HCC)    COVID-19 03/28/2023   Depression    DM (diabetes mellitus) (HCC)    DVT (deep vein thrombosis) in pregnancy    Gastroenteritis 03/27/2023   GERD (gastroesophageal reflux disease)    HCAP (healthcare-associated pneumonia) 06/21/2022   HTN (hypertension)    Hyperlipidemia    Obesity    Pneumonia 04/2018   RIGHT LOBE   Sleep apnea    needs to be retested - to get new cpap    Medications Ordered Prior to Encounter[2] Health Maintenance  Topic Date Due   Zoster (Shingles) Vaccine (1 of 2) Never done   COVID-19 Vaccine (3 - Pfizer risk series) 01/01/2020   Eye exam for diabetics  03/11/2020   Osteoporosis screening with Bone Density Scan  Never done   Breast Cancer Screening  12/24/2022   Complete foot exam   06/08/2023   Medicare Annual Wellness Visit  06/03/2024   Hemoglobin A1C  06/26/2024   Colon Cancer Screening  03/04/2025   DTaP/Tdap/Td vaccine (2 -  Td or Tdap) 03/03/2029   Pneumococcal Vaccine for age over 10  Completed   Flu Shot  Completed   Hepatitis C Screening  Completed   Meningitis B Vaccine  Aged Out   Hepatitis B Vaccine  Discontinued    Objective:   Vitals:   08/08/24 1038  BP: 108/65  Pulse: 65  Resp: 16  SpO2: 100%  Weight: 272 lb 3.2 oz (123.5 kg)     Physical Exam Vitals reviewed.  Constitutional:      Appearance: Normal appearance. She is obese.  HENT:     Head: Normocephalic.     Right Ear: Tympanic membrane, ear canal and external ear normal.     Left Ear: Tympanic membrane, ear canal and external ear normal.     Nose: Congestion and rhinorrhea present.     Mouth/Throat:     Comments: Red Eyes:     Extraocular Movements: Extraocular movements intact.     Pupils: Pupils are equal, round, and reactive to light.  Cardiovascular:     Rate and Rhythm: Normal rate.  Pulmonary:     Effort: Pulmonary effort is normal.     Breath sounds: Normal breath sounds.  Abdominal:     General: Bowel sounds are normal.     Palpations: Abdomen is soft.  Musculoskeletal:  General: Normal range of motion.     Cervical back: Normal range of motion and neck supple.  Skin:    General: Skin is warm and dry.  Neurological:     Mental Status: She is alert and oriented to person, place, and time.  Psychiatric:        Mood and Affect: Mood normal.        Behavior: Behavior normal.        Thought Content: Thought content normal.       Assessment & Plan  Lavanda was seen today for cyst and cough.  Diagnoses and all orders for this visit:  Encounter for screening mammogram for breast cancer -     MM 3D SCREENING MAMMOGRAM BILATERAL BREAST; Future  Postmenopausal estrogen deficiency -     DG Bone Density; Future  Cough productive of purulent sputum -     fluticasone  (FLONASE ) 50 MCG/ACT nasal spray; Place 2 sprays into both nostrils daily. -     benzonatate  (TESSALON ) 100 MG capsule; Take 1 capsule (100  mg total) by mouth 2 (two) times daily as needed for cough.      Patient have been counseled extensively about nutrition and exercise. Other issues discussed during this visit include: low cholesterol diet, weight control and daily exercise, foot care, annual eye examinations at Ophthalmology, importance of adherence with medications and regular follow-up. We also discussed long term complications of uncontrolled diabetes and hypertension.   No follow-ups on file.  The patient was given clear instructions to go to ER or return to medical center if symptoms don't improve, worsen or new problems develop. The patient verbalized understanding. The patient was told to call to get lab results if they haven't heard anything in the next week.   This note has been created with Education officer, environmental. Any transcriptional errors are unintentional.   Leslie SHAUNNA Bohr, NP 08/10/2024, 8:49 PM    [1]  Allergies Allergen Reactions   Bee Venom Anaphylaxis   Cefuroxime Itching and Nausea And Vomiting   Sulfa Antibiotics Itching and Rash   Chlorhexidine      Unknown rxn.   Iodinated Contrast Media Nausea And Vomiting  [2]  Current Outpatient Medications on File Prior to Visit  Medication Sig Dispense Refill   acetaminophen  (TYLENOL ) 325 MG tablet Take 2 tablets (650 mg total) by mouth every 6 (six) hours as needed for mild pain or moderate pain.     albuterol  (VENTOLIN  HFA) 108 (90 Base) MCG/ACT inhaler Inhale 1-2 puffs into the lungs every 6 (six) hours as needed for wheezing or shortness of breath. 7 g 1   apixaban  (ELIQUIS ) 5 MG TABS tablet Take 1 tablet (5 mg total) by mouth 2 (two) times daily. 180 tablet 3   AURYXIA  1 GM 210 MG(Fe) tablet Take 210 mg by mouth 3 (three) times daily.     busPIRone  (BUSPAR ) 5 MG tablet Take 1 tablet (5 mg total) by mouth 2 (two) times daily. 180 tablet 1   diphenhydrAMINE  (BENADRYL ) 25 MG tablet Take 25 mg by mouth every 6  (six) hours as needed for itching.     DULoxetine  (CYMBALTA ) 60 MG capsule Take 1 capsule (60 mg total) by mouth daily. 180 capsule 1   EPINEPHrine  0.3 mg/0.3 mL IJ SOAJ injection Inject 0.3 mg into the muscle as needed for anaphylaxis. (Patient not taking: Reported on 05/16/2024) 1 each 1   fluticasone  (FLONASE  ALLERGY RELIEF) 50 MCG/ACT nasal spray Place 2 sprays  into both nostrils daily as needed for allergies.     gabapentin  (NEURONTIN ) 100 MG capsule Take 1 capsule (100 mg total) by mouth every Tuesday, Thursday, and Saturday at 6 PM. (Patient not taking: Reported on 05/16/2024) 12 capsule 0   insulin  lispro (HUMALOG  KWIKPEN) 100 UNIT/ML KwikPen For blood sugars 0-150 give 0 units of insulin , 151-200 give 2 units of insulin , 201-250 give 4 units, 251-300 give 6 units, 301-350 give 8 units, 351-400 give 10 units,> 400 give 12 units and call M.D. (Patient taking differently: Inject 1-2 Units into the skin 3 (three) times daily. Per sliding scale) 15 mL 2   lidocaine -prilocaine  (EMLA ) cream Apply 1 Application topically Every Tuesday,Thursday,and Saturday with dialysis.     loratadine  (CLARITIN ) 10 MG tablet Take 1 tablet (10 mg total) by mouth daily. 90 tablet 1   metoCLOPramide  (REGLAN ) 5 MG tablet Take 1 tablet (5 mg total) by mouth every 8 (eight) hours as needed for nausea. 30 tablet 0   midodrine  (PROAMATINE ) 10 MG tablet Take 1 tablet (10 mg total) by mouth 2 (two) times daily. (Patient taking differently: Take 10 mg by mouth daily. May take 1 additional tablet as needed at dialysis, but never needed at home) 60 tablet 0   Multiple Vitamins-Minerals (MULTIVITAMIN WOMEN PO) Take 1 tablet by mouth daily.     nitroGLYCERIN  (NITROSTAT ) 0.4 MG SL tablet Place 1 tablet (0.4 mg total) under the tongue every 5 (five) minutes x 3 doses as needed for chest pain. (Patient not taking: Reported on 05/16/2024) 25 tablet 12   ondansetron  (ZOFRAN ) 4 MG tablet Take 1 tablet (4 mg total) by mouth every 8 (eight)  hours as needed for nausea or vomiting. 30 tablet 0   oxyCODONE -acetaminophen  (PERCOCET/ROXICET) 5-325 MG tablet Take 1 tablet by mouth every 8 (eight) hours as needed for severe pain (pain score 7-10). 20 tablet 0   pantoprazole  (PROTONIX ) 40 MG tablet TAKE ONE TABLET BY MOUTH DAILY AT 9AM (Patient taking differently: Take 40 mg by mouth daily as needed (for acid reflux).) 90 tablet 11   polyethylene glycol powder (GLYCOLAX /MIRALAX ) 17 GM/SCOOP powder Dissolve 1 capful (17g) in 4-8 ounces of liquid and take by mouth daily as needed 238 g 0   rosuvastatin  (CRESTOR ) 10 MG tablet Take 1 tablet (10 mg total) by mouth daily. 90 tablet 3   senna (SENOKOT) 8.6 MG TABS tablet Take 1 tablet (8.6 mg total) by mouth 2 (two) times daily. (Patient taking differently: Take 1 tablet by mouth daily as needed for mild constipation.) 30 tablet 0   No current facility-administered medications on file prior to visit.   "

## 2024-08-26 ENCOUNTER — Other Ambulatory Visit (INDEPENDENT_AMBULATORY_CARE_PROVIDER_SITE_OTHER): Payer: Self-pay | Admitting: Primary Care

## 2024-08-26 ENCOUNTER — Encounter

## 2024-08-26 DIAGNOSIS — F4323 Adjustment disorder with mixed anxiety and depressed mood: Secondary | ICD-10-CM

## 2024-08-29 ENCOUNTER — Telehealth (INDEPENDENT_AMBULATORY_CARE_PROVIDER_SITE_OTHER): Payer: Self-pay | Admitting: Primary Care

## 2024-08-29 NOTE — Telephone Encounter (Signed)
 Copied from CRM [id number]. Topic: Clinical - Medication Question >> Aug 29, 2024 11:41 AM Rosaria BRAVO wrote: Reason for CRM: Silvano from [facility] called for clarification on duloxetine    Best contact: [phone]

## 2024-09-03 ENCOUNTER — Other Ambulatory Visit (INDEPENDENT_AMBULATORY_CARE_PROVIDER_SITE_OTHER): Payer: Self-pay | Admitting: Primary Care

## 2024-09-03 DIAGNOSIS — R058 Other specified cough: Secondary | ICD-10-CM

## 2024-09-04 ENCOUNTER — Other Ambulatory Visit (INDEPENDENT_AMBULATORY_CARE_PROVIDER_SITE_OTHER): Payer: Self-pay | Admitting: Primary Care

## 2024-09-04 DIAGNOSIS — F4323 Adjustment disorder with mixed anxiety and depressed mood: Secondary | ICD-10-CM

## 2024-09-04 NOTE — Telephone Encounter (Signed)
 Will forward to provider

## 2024-09-04 NOTE — Telephone Encounter (Signed)
 Requested medication (s) are due for refill today - unsure  Requested medication (s) are on the active medication list -yes  Future visit scheduled -no  Last refill: 08/08/24 #20  Notes to clinic: Rx for acute visit- is this something provider wants to continue?  Requested Prescriptions  Pending Prescriptions Disp Refills   benzonatate  (TESSALON ) 100 MG capsule [Pharmacy Med Name: Benzonatate  100 MG Oral Capsule] 20 capsule 0    Sig: TAKE 1 CAPSULE BY MOUTH TWICE DAILY AS NEEDED FOR COUGH     Ear, Nose, and Throat:  Antitussives/Expectorants Passed - 09/04/2024  2:44 PM      Passed - Valid encounter within last 12 months    Recent Outpatient Visits           3 weeks ago Encounter for screening mammogram for breast cancer   [place] [facility] Celestia Rosaline SQUIBB, NP   9 months ago Generalized anxiety disorder   [place] [facility] Celestia Rosaline SQUIBB, NP   9 months ago Cough productive of purulent sputum   [place] [facility] Celestia Rosaline SQUIBB, NP   1 year ago Generalized anxiety disorder   [place] [facility] Celestia Rosaline SQUIBB, NP   1 year ago Diabetes mellitus type 2 in obese [facility])   [place] [facility] Celestia Rosaline SQUIBB, NP                 Requested Prescriptions  Pending Prescriptions Disp Refills   benzonatate  (TESSALON ) 100 MG capsule [Pharmacy Med Name: Benzonatate  100 MG Oral Capsule] 20 capsule 0    Sig: TAKE 1 CAPSULE BY MOUTH TWICE DAILY AS NEEDED FOR COUGH     Ear, Nose, and Throat:  Antitussives/Expectorants Passed - 09/04/2024  2:44 PM      Passed - Valid encounter within last 12 months    Recent Outpatient Visits           3 weeks ago Encounter for screening mammogram for breast cancer   [place] [facility] Celestia Rosaline SQUIBB, NP   9 months ago Generalized anxiety disorder   [place] [facility] Celestia Rosaline SQUIBB, NP   9 months ago Cough productive of purulent sputum   [place] [facility] Celestia Rosaline SQUIBB, NP   1 year ago Generalized anxiety disorder   [place] [facility] Celestia Rosaline SQUIBB, NP   1 year ago Diabetes mellitus type 2 in obese [facility])   [place] [facility] Celestia Rosaline SQUIBB, NP

## 2024-09-04 NOTE — Telephone Encounter (Signed)
 Copied from CRM [id number]. Topic: Clinical - Medication Refill >> Sep 04, 2024 11:43 AM Rea ORN wrote: Medication:  busPIRone  (BUSPAR ) 5 MG tablet  loratadine  (CLARITIN ) 10 MG tablet   Has the patient contacted their pharmacy? Yes (Agent: If no, request that the patient contact the pharmacy for the refill. If patient does not wish to contact the pharmacy document the reason why and proceed with request.) (Agent: If yes, when and what did the pharmacy advise?)  This is the patient's preferred pharmacy:   [facility] [place] [address] [place] [address] Phone: [phone] Fax: [phone]  Is this the correct pharmacy for this prescription? Yes If no, delete pharmacy and type the correct one.   Has the prescription been filled recently? No  Is the patient out of the medication? Yes  Has the patient been seen for an appointment in the last year OR does the patient have an upcoming appointment? Yes  Can we respond through MyChart? No  Agent: Please be advised that Rx refills may take up to 3 business days. We ask that you follow-up with your pharmacy.

## 2024-09-05 NOTE — Telephone Encounter (Unsigned)
 Copied from CRM #[id number]. Topic: Clinical - Medication Question >> Sep 05, 2024 11:36 AM Gattis SQUIBB wrote: Reason for CRM: Pharmacy called for Clarification on the [place] .  They said the quantity does not match the prescription  [phone]

## 2024-09-05 NOTE — Telephone Encounter (Signed)
 Will forward to provider

## 2024-09-05 NOTE — Telephone Encounter (Signed)
 Duplicate request, too soon for refill.  Requested Prescriptions  Pending Prescriptions Disp Refills   loratadine  (CLARITIN ) 10 MG tablet [Pharmacy Med Name: loratadine  10 mg tablet] 90 tablet 11    Sig: TAKE ONE TABLET (10 MG TOTAL) BY MOUTH DAILY AT 9AM EVERY MORNING     Ear, Nose, and Throat:  Antihistamines 2 Failed - 09/05/2024  8:29 AM      Failed - Cr in normal range and within 360 days    Creatinine, Ser  Date Value Ref Range Status  05/29/2024 6.30 (H) 0.44 - 1.00 mg/dL Final   Creatinine, Urine  Date Value Ref Range Status  07/29/2021 97.28 mg/dL Final    Comment:    Performed at [facility], [address], [place], [place] [address]         Passed - Valid encounter within last 12 months    Recent Outpatient Visits           4 weeks ago Encounter for screening mammogram for breast cancer   [place] [facility] Celestia Rosaline SQUIBB, NP   9 months ago Generalized anxiety disorder   [place] [facility] Celestia Rosaline SQUIBB, NP   9 months ago Cough productive of purulent sputum   [place] [facility] Celestia Rosaline SQUIBB, NP   1 year ago Generalized anxiety disorder   [place] [facility] Celestia Rosaline SQUIBB, NP   1 year ago Diabetes mellitus type 2 in obese [facility])   [place] [facility] Celestia Rosaline SQUIBB, NP

## 2024-09-07 ENCOUNTER — Other Ambulatory Visit (HOSPITAL_COMMUNITY): Payer: Self-pay | Admitting: Internal Medicine

## 2024-09-07 DIAGNOSIS — I4819 Other persistent atrial fibrillation: Secondary | ICD-10-CM

## 2024-09-09 ENCOUNTER — Other Ambulatory Visit (INDEPENDENT_AMBULATORY_CARE_PROVIDER_SITE_OTHER): Payer: Self-pay

## 2024-09-09 DIAGNOSIS — K3184 Gastroparesis: Secondary | ICD-10-CM

## 2024-09-12 MED ORDER — ALBUTEROL SULFATE HFA 108 (90 BASE) MCG/ACT IN AERS: 1.0000 | INHALATION_SPRAY | Freq: Four times a day (QID) | RESPIRATORY_TRACT | 1 refills | Status: DC | PRN

## 2024-09-12 MED ORDER — PANTOPRAZOLE SODIUM 40 MG PO TBEC: 40.0000 mg | DELAYED_RELEASE_TABLET | Freq: Every day | ORAL | 1 refills | Status: AC

## 2024-09-18 ENCOUNTER — Other Ambulatory Visit (INDEPENDENT_AMBULATORY_CARE_PROVIDER_SITE_OTHER): Payer: Self-pay | Admitting: Primary Care

## 2024-09-18 DIAGNOSIS — K3184 Gastroparesis: Secondary | ICD-10-CM

## 2024-09-18 DIAGNOSIS — F4323 Adjustment disorder with mixed anxiety and depressed mood: Secondary | ICD-10-CM

## 2024-09-18 NOTE — Telephone Encounter (Signed)
 Copied from CRM #[id number]. Topic: Clinical - Medication Refill >> Sep 18, 2024  3:19 PM Nathanel BROCKS wrote: Medication:  loratadine  (CLARITIN ) 10 MG tablet busPIRone  (BUSPAR ) 5 MG tablet pantoprazole  (PROTONIX ) 40 MG tablet  Has the patient contacted their pharmacy? Yes  This is the patient's preferred pharmacy:   [facility] - [place], [place] - [address] [place] [address] Phone: [phone] Fax: [phone]  Is this the correct pharmacy for this prescription? Yes If no, delete pharmacy and type the correct one.   Has the prescription been filled recently? Yes  Is the patient out of the medication? Yes  Has the patient been seen for an appointment in the last year OR does the patient have an upcoming appointment? Yes  Can we respond through MyChart? No  Agent: Please be advised that Rx refills may take up to 3 business days. We ask that you follow-up with your pharmacy.

## 2024-09-19 NOTE — Telephone Encounter (Signed)
 Too soon for refill.  Requested Prescriptions  Pending Prescriptions Disp Refills   loratadine  (CLARITIN ) 10 MG tablet 90 tablet 1    Sig: Take 1 tablet (10 mg total) by mouth daily.     Ear, Nose, and Throat:  Antihistamines 2 Failed - 09/19/2024  1:49 PM      Failed - Cr in normal range and within 360 days    Creatinine, Ser  Date Value Ref Range Status  05/29/2024 6.30 (H) 0.44 - 1.00 mg/dL Final   Creatinine, Urine  Date Value Ref Range Status  07/29/2021 97.28 mg/dL Final    Comment:    Performed at [facility], [address] [place], [place] [address]         Passed - Valid encounter within last 12 months    Recent Outpatient Visits           1 month ago Encounter for screening mammogram for breast cancer   [place] [facility] Celestia Rosaline SQUIBB, NP   9 months ago Generalized anxiety disorder   [place] [facility] Celestia Rosaline SQUIBB, NP   10 months ago Cough productive of purulent sputum   [place] [facility] Celestia Rosaline SQUIBB, NP   1 year ago Generalized anxiety disorder   [place] [facility] Celestia Rosaline SQUIBB, NP   1 year ago Diabetes mellitus type 2 in obese [facility])   [place] [facility] Celestia Rosaline SQUIBB, NP               pantoprazole  (PROTONIX ) 40 MG tablet 90 tablet 1    Sig: Take 1 tablet (40 mg total) by mouth daily.     Gastroenterology: Proton Pump Inhibitors Passed - 09/19/2024  1:49 PM      Passed - Valid encounter within last 12 months    Recent Outpatient Visits           1 month ago Encounter for screening mammogram for breast cancer   [place] [facility] Celestia Rosaline SQUIBB, NP   9 months ago Generalized anxiety disorder   [place] [facility] Celestia Rosaline SQUIBB, NP   10 months ago Cough productive of purulent sputum   [place] [facility] Celestia Rosaline SQUIBB, NP   1  year ago Generalized anxiety disorder   [place] [facility] Celestia Rosaline SQUIBB, NP   1 year ago Diabetes mellitus type 2 in obese [facility])   [place] [facility] Celestia Rosaline SQUIBB, NP               busPIRone  (BUSPAR ) 5 MG tablet 180 tablet 1    Sig: Take 1 tablet (5 mg total) by mouth 2 (two) times daily.     Psychiatry: Anxiolytics/Hypnotics - Non-controlled Passed - 09/19/2024  1:49 PM      Passed - Valid encounter within last 12 months    Recent Outpatient Visits           1 month ago Encounter for screening mammogram for breast cancer   [place] [facility] Celestia Rosaline SQUIBB, NP   9 months ago Generalized anxiety disorder   [place] [facility] Celestia Rosaline SQUIBB, NP   10 months ago Cough productive of purulent sputum    [facility] Celestia Rosaline SQUIBB, NP   1 year ago Generalized anxiety disorder    [facility] Celestia Rosaline SQUIBB, NP  1 year ago Diabetes mellitus type 2 in obese [facility])   [place] [facility] Celestia Rosaline SQUIBB, NP

## 2024-09-23 ENCOUNTER — Other Ambulatory Visit (INDEPENDENT_AMBULATORY_CARE_PROVIDER_SITE_OTHER): Payer: Self-pay | Admitting: Primary Care

## 2024-09-23 NOTE — Telephone Encounter (Unsigned)
 Copied from CRM [id number]. Topic: Clinical - Medication Refill >> Sep 23, 2024  3:02 PM Kendralyn S wrote: Medication: albuterol  (VENTOLIN  HFA) 108 (90 Base) MCG/ACT inhaler  Has the patient contacted their pharmacy? Yes (Agent: If no, request that the patient contact the pharmacy for the refill. If patient does not wish to contact the pharmacy document the reason why and proceed with request.) (Agent: If yes, when and what did the pharmacy advise?)  This is the patient's preferred pharmacy:   [facility] [place], [place] - [address] [place] [address] Phone: [phone] Fax: [phone]  Is this the correct pharmacy for this prescription? Yes If no, delete pharmacy and type the correct one.   Has the prescription been filled recently? No  Is the patient out of the medication? Yes  Has the patient been seen for an appointment in the last year OR does the patient have an upcoming appointment? Yes  Can we respond through MyChart? Yes  Agent: Please be advised that Rx refills may take up to 3 business days. We ask that you follow-up with your pharmacy.

## 2024-09-25 ENCOUNTER — Other Ambulatory Visit (INDEPENDENT_AMBULATORY_CARE_PROVIDER_SITE_OTHER): Payer: Self-pay | Admitting: Primary Care

## 2024-09-25 MED ORDER — ALBUTEROL SULFATE HFA 108 (90 BASE) MCG/ACT IN AERS: 1.0000 | INHALATION_SPRAY | Freq: Four times a day (QID) | RESPIRATORY_TRACT | 0 refills | Status: AC | PRN

## 2024-09-25 NOTE — Telephone Encounter (Signed)
 Requested Prescriptions  Pending Prescriptions Disp Refills   albuterol  (VENTOLIN  HFA) 108 (90 Base) MCG/ACT inhaler 7 g 0    Sig: Inhale 1-2 puffs into the lungs every 6 (six) hours as needed for wheezing or shortness of breath.     Pulmonology:  Beta Agonists 2 Passed - 09/25/2024 11:13 AM      Passed - Last BP in normal range    BP Readings from Last 1 Encounters:  08/08/24 108/65         Passed - Last Heart Rate in normal range    Pulse Readings from Last 1 Encounters:  08/08/24 65         Passed - Valid encounter within last 12 months    Recent Outpatient Visits           1 month ago Encounter for screening mammogram for breast cancer   [place] [facility] Celestia Rosaline SQUIBB, NP   10 months ago Generalized anxiety disorder   [place] [facility] Celestia Rosaline SQUIBB, NP   10 months ago Cough productive of purulent sputum   [place] [facility] Celestia Rosaline SQUIBB, NP   1 year ago Generalized anxiety disorder   [place] [facility] Celestia Rosaline SQUIBB, NP   2 years ago Diabetes mellitus type 2 in obese [facility])    [facility] Celestia Rosaline SQUIBB, NP

## 2024-09-26 NOTE — Telephone Encounter (Signed)
 Requested Prescriptions  Refused Prescriptions Disp Refills   loratadine  (CLARITIN ) 10 MG tablet [Pharmacy Med Name: loratadine  10 mg tablet] 90 tablet 0    Sig: TAKE ONE TABLET (10 MG TOTAL) BY MOUTH DAILY AT 9AM EVERY MORNING     Ear, Nose, and Throat:  Antihistamines 2 Failed - 09/26/2024  1:57 PM      Failed - Cr in normal range and within 360 days    Creatinine, Ser  Date Value Ref Range Status  05/29/2024 6.30 (H) 0.44 - 1.00 mg/dL Final   Creatinine, Urine  Date Value Ref Range Status  07/29/2021 97.28 mg/dL Final    Comment:    Performed at [facility], [address], [place], [place] [address]         Passed - Valid encounter within last 12 months    Recent Outpatient Visits           1 month ago Encounter for screening mammogram for breast cancer   [place] [facility] Celestia Rosaline SQUIBB, NP   10 months ago Generalized anxiety disorder   [place] [facility] Celestia Rosaline SQUIBB, NP   10 months ago Cough productive of purulent sputum   [place] [facility] Celestia Rosaline SQUIBB, NP   1 year ago Generalized anxiety disorder   [place] [facility] Celestia Rosaline SQUIBB, NP   2 years ago Diabetes mellitus type 2 in obese [facility])   [place] [facility] Celestia Rosaline SQUIBB, NP       Future Appointments             In 3 months Lonni Slain, MD [facility] [place]

## 2024-10-01 ENCOUNTER — Ambulatory Visit

## 2024-11-25 ENCOUNTER — Encounter

## 2024-12-26 ENCOUNTER — Ambulatory Visit (HOSPITAL_BASED_OUTPATIENT_CLINIC_OR_DEPARTMENT_OTHER): Admitting: Cardiology

## 2024-12-31 ENCOUNTER — Ambulatory Visit

## 2025-02-24 ENCOUNTER — Encounter

## 2025-04-01 ENCOUNTER — Ambulatory Visit

## 2025-05-26 ENCOUNTER — Encounter

## 2025-07-01 ENCOUNTER — Ambulatory Visit

## 2025-09-30 ENCOUNTER — Ambulatory Visit
